# Patient Record
Sex: Female | Born: 1945
Health system: Southern US, Community
[De-identification: ages and names within clinical notes are randomized; demographics above are authoritative.]

## PROBLEM LIST (undated history)

## (undated) DIAGNOSIS — E559 Vitamin D deficiency, unspecified: Secondary | ICD-10-CM

## (undated) DIAGNOSIS — N189 Chronic kidney disease, unspecified: Secondary | ICD-10-CM

## (undated) DIAGNOSIS — Z923 Personal history of irradiation: Secondary | ICD-10-CM

## (undated) DIAGNOSIS — R109 Unspecified abdominal pain: Secondary | ICD-10-CM

## (undated) DIAGNOSIS — N183 Chronic kidney disease, stage 3 unspecified: Secondary | ICD-10-CM

## (undated) DIAGNOSIS — N9489 Other specified conditions associated with female genital organs and menstrual cycle: Secondary | ICD-10-CM

## (undated) DIAGNOSIS — K56609 Unspecified intestinal obstruction, unspecified as to partial versus complete obstruction: Secondary | ICD-10-CM

## (undated) DIAGNOSIS — T7840XA Allergy, unspecified, initial encounter: Secondary | ICD-10-CM

## (undated) DIAGNOSIS — M109 Gout, unspecified: Secondary | ICD-10-CM

## (undated) DIAGNOSIS — C50919 Malignant neoplasm of unspecified site of unspecified female breast: Secondary | ICD-10-CM

## (undated) DIAGNOSIS — N2581 Secondary hyperparathyroidism of renal origin: Secondary | ICD-10-CM

## (undated) DIAGNOSIS — J189 Pneumonia, unspecified organism: Secondary | ICD-10-CM

## (undated) DIAGNOSIS — IMO0002 Reserved for concepts with insufficient information to code with codable children: Secondary | ICD-10-CM

## (undated) DIAGNOSIS — Z972 Presence of dental prosthetic device (complete) (partial): Secondary | ICD-10-CM

## (undated) DIAGNOSIS — D649 Anemia, unspecified: Secondary | ICD-10-CM

## (undated) DIAGNOSIS — R0902 Hypoxemia: Secondary | ICD-10-CM

## (undated) DIAGNOSIS — I823 Embolism and thrombosis of renal vein: Secondary | ICD-10-CM

## (undated) DIAGNOSIS — I4510 Unspecified right bundle-branch block: Secondary | ICD-10-CM

## (undated) DIAGNOSIS — I639 Cerebral infarction, unspecified: Secondary | ICD-10-CM

## (undated) DIAGNOSIS — I890 Lymphedema, not elsewhere classified: Secondary | ICD-10-CM

## (undated) DIAGNOSIS — M329 Systemic lupus erythematosus, unspecified: Secondary | ICD-10-CM

## (undated) DIAGNOSIS — N184 Chronic kidney disease, stage 4 (severe): Secondary | ICD-10-CM

## (undated) DIAGNOSIS — E785 Hyperlipidemia, unspecified: Secondary | ICD-10-CM

## (undated) DIAGNOSIS — I509 Heart failure, unspecified: Secondary | ICD-10-CM

## (undated) DIAGNOSIS — I10 Essential (primary) hypertension: Secondary | ICD-10-CM

## (undated) DIAGNOSIS — I2510 Atherosclerotic heart disease of native coronary artery without angina pectoris: Secondary | ICD-10-CM

## (undated) DIAGNOSIS — G8929 Other chronic pain: Secondary | ICD-10-CM

## (undated) DIAGNOSIS — E538 Deficiency of other specified B group vitamins: Secondary | ICD-10-CM

## (undated) HISTORY — DX: Atherosclerotic heart disease of native coronary artery without angina pectoris: I25.10

## (undated) HISTORY — DX: Chronic kidney disease, unspecified: N18.9

## (undated) HISTORY — DX: Lymphedema, not elsewhere classified: I89.0

## (undated) HISTORY — DX: Personal history of irradiation: Z92.3

## (undated) HISTORY — DX: Unspecified abdominal pain: R10.9

## (undated) HISTORY — DX: Allergy, unspecified, initial encounter: T78.40XA

## (undated) HISTORY — PX: ABDOMINAL HYSTERECTOMY: SHX81

## (undated) HISTORY — PX: BREAST LUMPECTOMY: SHX2

## (undated) HISTORY — PX: CARDIAC CATHETERIZATION: SHX172

## (undated) HISTORY — PX: HEMICOLECTOMY: SHX854

## (undated) HISTORY — PX: HERNIA REPAIR: SHX51

## (undated) HISTORY — DX: Vitamin D deficiency, unspecified: E55.9

## (undated) HISTORY — DX: Deficiency of other specified B group vitamins: E53.8

## (undated) HISTORY — DX: Other specified conditions associated with female genital organs and menstrual cycle: N94.89

## (undated) HISTORY — DX: Hyperlipidemia, unspecified: E78.5

## (undated) HISTORY — DX: Secondary hyperparathyroidism of renal origin: N25.81

## (undated) HISTORY — DX: Heart failure, unspecified: I50.9

## (undated) HISTORY — DX: Anemia, unspecified: D64.9

## (undated) HISTORY — DX: Cerebral infarction, unspecified: I63.9

## (undated) HISTORY — PX: COLONOSCOPY: SHX174

## (undated) HISTORY — PX: CHOLECYSTECTOMY: SHX55

## (undated) HISTORY — DX: Gout, unspecified: M10.9

## (undated) HISTORY — DX: Embolism and thrombosis of renal vein: I82.3

## (undated) HISTORY — DX: Other chronic pain: G89.29

## (undated) HISTORY — PX: BREAST BIOPSY: SHX20

## (undated) HISTORY — DX: Essential (primary) hypertension: I10

---

## 1994-03-19 DIAGNOSIS — I2510 Atherosclerotic heart disease of native coronary artery without angina pectoris: Secondary | ICD-10-CM

## 1994-03-19 HISTORY — DX: Atherosclerotic heart disease of native coronary artery without angina pectoris: I25.10

## 1998-05-03 ENCOUNTER — Ambulatory Visit (HOSPITAL_COMMUNITY): Admission: RE | Admit: 1998-05-03 | Discharge: 1998-05-03 | Payer: Self-pay | Admitting: General Surgery

## 1998-07-01 ENCOUNTER — Ambulatory Visit (HOSPITAL_COMMUNITY): Admission: RE | Admit: 1998-07-01 | Discharge: 1998-07-01 | Payer: Self-pay | Admitting: Neurosurgery

## 1998-07-01 ENCOUNTER — Encounter: Payer: Self-pay | Admitting: Neurosurgery

## 1998-07-11 ENCOUNTER — Ambulatory Visit (HOSPITAL_BASED_OUTPATIENT_CLINIC_OR_DEPARTMENT_OTHER): Admission: RE | Admit: 1998-07-11 | Discharge: 1998-07-11 | Payer: Self-pay

## 1998-07-21 ENCOUNTER — Encounter: Payer: Self-pay | Admitting: Neurosurgery

## 1998-07-25 ENCOUNTER — Encounter: Payer: Self-pay | Admitting: Neurosurgery

## 1998-07-25 ENCOUNTER — Inpatient Hospital Stay (HOSPITAL_COMMUNITY): Admission: RE | Admit: 1998-07-25 | Discharge: 1998-07-27 | Payer: Self-pay | Admitting: Neurosurgery

## 1998-09-04 ENCOUNTER — Ambulatory Visit (HOSPITAL_COMMUNITY): Admission: RE | Admit: 1998-09-04 | Discharge: 1998-09-04 | Payer: Self-pay | Admitting: Neurosurgery

## 1998-09-04 ENCOUNTER — Encounter: Payer: Self-pay | Admitting: Neurosurgery

## 1998-11-08 ENCOUNTER — Observation Stay (HOSPITAL_COMMUNITY): Admission: AD | Admit: 1998-11-08 | Discharge: 1998-11-09 | Payer: Self-pay | Admitting: *Deleted

## 1998-11-30 ENCOUNTER — Encounter: Admission: RE | Admit: 1998-11-30 | Discharge: 1999-02-28 | Payer: Self-pay | Admitting: Internal Medicine

## 1999-03-16 ENCOUNTER — Ambulatory Visit (HOSPITAL_COMMUNITY): Admission: RE | Admit: 1999-03-16 | Discharge: 1999-03-16 | Payer: Self-pay | Admitting: Internal Medicine

## 1999-05-11 ENCOUNTER — Encounter: Admission: RE | Admit: 1999-05-11 | Discharge: 1999-05-11 | Payer: Self-pay | Admitting: Obstetrics and Gynecology

## 1999-05-11 ENCOUNTER — Encounter: Payer: Self-pay | Admitting: Obstetrics and Gynecology

## 1999-08-11 ENCOUNTER — Encounter: Admission: RE | Admit: 1999-08-11 | Discharge: 1999-11-09 | Payer: Self-pay | Admitting: *Deleted

## 2000-05-13 ENCOUNTER — Encounter: Admission: RE | Admit: 2000-05-13 | Discharge: 2000-08-11 | Payer: Self-pay | Admitting: Internal Medicine

## 2000-05-16 ENCOUNTER — Encounter: Payer: Self-pay | Admitting: Obstetrics and Gynecology

## 2000-05-16 ENCOUNTER — Encounter: Admission: RE | Admit: 2000-05-16 | Discharge: 2000-05-16 | Payer: Self-pay | Admitting: Obstetrics and Gynecology

## 2000-09-11 ENCOUNTER — Encounter: Admission: RE | Admit: 2000-09-11 | Discharge: 2000-12-10 | Payer: Self-pay | Admitting: Internal Medicine

## 2001-01-13 ENCOUNTER — Encounter: Payer: Self-pay | Admitting: Gastroenterology

## 2001-01-14 ENCOUNTER — Encounter (INDEPENDENT_AMBULATORY_CARE_PROVIDER_SITE_OTHER): Payer: Self-pay | Admitting: Specialist

## 2001-01-14 ENCOUNTER — Other Ambulatory Visit: Admission: RE | Admit: 2001-01-14 | Discharge: 2001-01-14 | Payer: Self-pay | Admitting: Gastroenterology

## 2001-02-11 ENCOUNTER — Encounter: Admission: RE | Admit: 2001-02-11 | Discharge: 2001-05-12 | Payer: Self-pay | Admitting: Internal Medicine

## 2001-02-24 ENCOUNTER — Ambulatory Visit (HOSPITAL_COMMUNITY): Admission: RE | Admit: 2001-02-24 | Discharge: 2001-02-24 | Payer: Self-pay | Admitting: Gastroenterology

## 2001-02-24 ENCOUNTER — Encounter: Payer: Self-pay | Admitting: Gastroenterology

## 2001-03-19 HISTORY — PX: BOWEL RESECTION: SHX1257

## 2001-04-16 ENCOUNTER — Encounter: Payer: Self-pay | Admitting: Gastroenterology

## 2001-04-16 ENCOUNTER — Ambulatory Visit (HOSPITAL_COMMUNITY): Admission: RE | Admit: 2001-04-16 | Discharge: 2001-04-16 | Payer: Self-pay | Admitting: Gastroenterology

## 2001-04-25 ENCOUNTER — Other Ambulatory Visit: Admission: RE | Admit: 2001-04-25 | Discharge: 2001-04-25 | Payer: Self-pay | Admitting: *Deleted

## 2001-05-08 ENCOUNTER — Ambulatory Visit (HOSPITAL_COMMUNITY): Admission: RE | Admit: 2001-05-08 | Discharge: 2001-05-08 | Payer: Self-pay | Admitting: *Deleted

## 2001-05-08 ENCOUNTER — Encounter: Payer: Self-pay | Admitting: *Deleted

## 2001-05-13 ENCOUNTER — Ambulatory Visit: Admission: RE | Admit: 2001-05-13 | Discharge: 2001-05-13 | Payer: Self-pay | Admitting: Gynecology

## 2001-05-28 ENCOUNTER — Ambulatory Visit (HOSPITAL_COMMUNITY): Admission: RE | Admit: 2001-05-28 | Discharge: 2001-05-28 | Payer: Self-pay | Admitting: *Deleted

## 2001-05-28 ENCOUNTER — Encounter: Payer: Self-pay | Admitting: Gynecology

## 2001-06-04 ENCOUNTER — Ambulatory Visit (HOSPITAL_COMMUNITY): Admission: RE | Admit: 2001-06-04 | Discharge: 2001-06-04 | Payer: Self-pay | Admitting: *Deleted

## 2001-06-27 ENCOUNTER — Ambulatory Visit (HOSPITAL_COMMUNITY): Admission: RE | Admit: 2001-06-27 | Discharge: 2001-06-27 | Payer: Self-pay | Admitting: Cardiovascular Disease

## 2001-07-08 ENCOUNTER — Encounter: Payer: Self-pay | Admitting: Cardiology

## 2001-07-08 ENCOUNTER — Ambulatory Visit (HOSPITAL_COMMUNITY): Admission: RE | Admit: 2001-07-08 | Discharge: 2001-07-08 | Payer: Self-pay | Admitting: Cardiology

## 2001-10-07 ENCOUNTER — Ambulatory Visit: Admission: RE | Admit: 2001-10-07 | Discharge: 2001-10-07 | Payer: Self-pay | Admitting: Gynecologic Oncology

## 2001-10-17 DIAGNOSIS — N9489 Other specified conditions associated with female genital organs and menstrual cycle: Secondary | ICD-10-CM

## 2001-10-17 HISTORY — DX: Other specified conditions associated with female genital organs and menstrual cycle: N94.89

## 2001-10-17 HISTORY — PX: BILATERAL SALPINGOOPHORECTOMY: SHX1223

## 2001-11-12 ENCOUNTER — Encounter (INDEPENDENT_AMBULATORY_CARE_PROVIDER_SITE_OTHER): Payer: Self-pay | Admitting: Specialist

## 2001-11-12 ENCOUNTER — Ambulatory Visit (HOSPITAL_COMMUNITY): Admission: RE | Admit: 2001-11-12 | Discharge: 2001-11-12 | Payer: Self-pay | Admitting: *Deleted

## 2001-12-06 ENCOUNTER — Encounter: Payer: Self-pay | Admitting: Emergency Medicine

## 2001-12-06 ENCOUNTER — Emergency Department (HOSPITAL_COMMUNITY): Admission: EM | Admit: 2001-12-06 | Discharge: 2001-12-06 | Payer: Self-pay | Admitting: Unknown Physician Specialty

## 2001-12-08 ENCOUNTER — Inpatient Hospital Stay (HOSPITAL_COMMUNITY): Admission: EM | Admit: 2001-12-08 | Discharge: 2001-12-20 | Payer: Self-pay | Admitting: Emergency Medicine

## 2001-12-08 ENCOUNTER — Encounter: Payer: Self-pay | Admitting: Internal Medicine

## 2001-12-09 ENCOUNTER — Encounter: Payer: Self-pay | Admitting: Internal Medicine

## 2001-12-09 ENCOUNTER — Encounter (INDEPENDENT_AMBULATORY_CARE_PROVIDER_SITE_OTHER): Payer: Self-pay | Admitting: Specialist

## 2001-12-16 ENCOUNTER — Encounter: Payer: Self-pay | Admitting: Urology

## 2002-01-04 ENCOUNTER — Encounter: Payer: Self-pay | Admitting: Emergency Medicine

## 2002-01-04 ENCOUNTER — Inpatient Hospital Stay (HOSPITAL_COMMUNITY): Admission: EM | Admit: 2002-01-04 | Discharge: 2002-01-10 | Payer: Self-pay | Admitting: Emergency Medicine

## 2002-01-05 ENCOUNTER — Encounter: Payer: Self-pay | Admitting: Nephrology

## 2002-01-06 ENCOUNTER — Encounter (INDEPENDENT_AMBULATORY_CARE_PROVIDER_SITE_OTHER): Payer: Self-pay | Admitting: *Deleted

## 2002-01-06 ENCOUNTER — Encounter: Payer: Self-pay | Admitting: Gastroenterology

## 2002-01-07 ENCOUNTER — Encounter: Payer: Self-pay | Admitting: Gastroenterology

## 2002-01-18 ENCOUNTER — Inpatient Hospital Stay (HOSPITAL_COMMUNITY): Admission: EM | Admit: 2002-01-18 | Discharge: 2002-01-29 | Payer: Self-pay | Admitting: Emergency Medicine

## 2002-01-18 ENCOUNTER — Encounter: Payer: Self-pay | Admitting: Family Medicine

## 2002-01-18 ENCOUNTER — Encounter: Payer: Self-pay | Admitting: Gastroenterology

## 2002-01-21 ENCOUNTER — Encounter: Payer: Self-pay | Admitting: Gastroenterology

## 2002-01-21 ENCOUNTER — Encounter: Payer: Self-pay | Admitting: Orthopaedic Surgery

## 2002-01-23 ENCOUNTER — Encounter: Payer: Self-pay | Admitting: Gastroenterology

## 2002-01-24 ENCOUNTER — Encounter: Payer: Self-pay | Admitting: Gastroenterology

## 2002-01-29 ENCOUNTER — Encounter: Payer: Self-pay | Admitting: Internal Medicine

## 2002-03-05 ENCOUNTER — Inpatient Hospital Stay (HOSPITAL_COMMUNITY): Admission: EM | Admit: 2002-03-05 | Discharge: 2002-03-10 | Payer: Self-pay | Admitting: Emergency Medicine

## 2002-03-06 ENCOUNTER — Encounter: Payer: Self-pay | Admitting: Pulmonary Disease

## 2003-05-25 ENCOUNTER — Emergency Department (HOSPITAL_COMMUNITY): Admission: EM | Admit: 2003-05-25 | Discharge: 2003-05-25 | Payer: Self-pay | Admitting: Emergency Medicine

## 2004-02-14 ENCOUNTER — Ambulatory Visit: Payer: Self-pay | Admitting: Hematology & Oncology

## 2004-03-01 ENCOUNTER — Ambulatory Visit: Payer: Self-pay | Admitting: Internal Medicine

## 2004-03-08 ENCOUNTER — Ambulatory Visit: Payer: Self-pay | Admitting: Internal Medicine

## 2004-04-04 ENCOUNTER — Ambulatory Visit: Payer: Self-pay | Admitting: Internal Medicine

## 2004-04-11 ENCOUNTER — Ambulatory Visit: Payer: Self-pay | Admitting: Hematology & Oncology

## 2004-04-19 ENCOUNTER — Ambulatory Visit: Payer: Self-pay | Admitting: Internal Medicine

## 2004-05-16 ENCOUNTER — Ambulatory Visit: Payer: Self-pay | Admitting: Internal Medicine

## 2004-06-09 ENCOUNTER — Ambulatory Visit: Payer: Self-pay | Admitting: Hematology & Oncology

## 2004-06-09 ENCOUNTER — Ambulatory Visit: Payer: Self-pay | Admitting: Internal Medicine

## 2004-06-17 HISTORY — PX: PTCA: SHX146

## 2004-06-19 ENCOUNTER — Ambulatory Visit: Payer: Self-pay | Admitting: Internal Medicine

## 2004-06-29 ENCOUNTER — Encounter: Payer: Self-pay | Admitting: Cardiology

## 2004-06-29 ENCOUNTER — Ambulatory Visit: Payer: Self-pay | Admitting: Cardiovascular Disease

## 2004-06-29 ENCOUNTER — Inpatient Hospital Stay (HOSPITAL_COMMUNITY): Admission: EM | Admit: 2004-06-29 | Discharge: 2004-07-01 | Payer: Self-pay | Admitting: Emergency Medicine

## 2004-07-06 ENCOUNTER — Ambulatory Visit: Payer: Self-pay | Admitting: Cardiovascular Disease

## 2004-07-10 ENCOUNTER — Encounter: Admission: RE | Admit: 2004-07-10 | Discharge: 2004-10-08 | Payer: Self-pay | Admitting: Internal Medicine

## 2004-07-18 ENCOUNTER — Ambulatory Visit: Payer: Self-pay | Admitting: Cardiovascular Disease

## 2004-07-20 ENCOUNTER — Ambulatory Visit: Payer: Self-pay | Admitting: Internal Medicine

## 2004-07-28 ENCOUNTER — Ambulatory Visit: Payer: Self-pay

## 2004-07-31 ENCOUNTER — Ambulatory Visit: Payer: Self-pay | Admitting: Cardiovascular Disease

## 2004-07-31 ENCOUNTER — Ambulatory Visit: Payer: Self-pay | Admitting: Hematology & Oncology

## 2004-09-29 ENCOUNTER — Ambulatory Visit: Payer: Self-pay | Admitting: Cardiovascular Disease

## 2004-10-03 ENCOUNTER — Ambulatory Visit: Payer: Self-pay | Admitting: Hematology & Oncology

## 2004-10-27 ENCOUNTER — Ambulatory Visit: Payer: Self-pay | Admitting: Internal Medicine

## 2004-11-14 ENCOUNTER — Ambulatory Visit: Payer: Self-pay | Admitting: Internal Medicine

## 2004-11-22 ENCOUNTER — Encounter: Admission: RE | Admit: 2004-11-22 | Discharge: 2005-02-20 | Payer: Self-pay | Admitting: Internal Medicine

## 2004-11-28 ENCOUNTER — Ambulatory Visit: Payer: Self-pay | Admitting: Internal Medicine

## 2004-11-29 ENCOUNTER — Ambulatory Visit: Payer: Self-pay | Admitting: Hematology & Oncology

## 2005-01-09 ENCOUNTER — Ambulatory Visit: Payer: Self-pay | Admitting: Internal Medicine

## 2005-01-23 ENCOUNTER — Ambulatory Visit: Payer: Self-pay | Admitting: Internal Medicine

## 2005-01-24 ENCOUNTER — Ambulatory Visit: Payer: Self-pay | Admitting: Hematology & Oncology

## 2005-02-27 ENCOUNTER — Ambulatory Visit: Payer: Self-pay | Admitting: Internal Medicine

## 2005-03-13 ENCOUNTER — Ambulatory Visit: Payer: Self-pay | Admitting: Internal Medicine

## 2005-03-21 ENCOUNTER — Ambulatory Visit: Payer: Self-pay | Admitting: Hematology & Oncology

## 2005-04-03 ENCOUNTER — Ambulatory Visit: Payer: Self-pay | Admitting: Internal Medicine

## 2005-04-17 ENCOUNTER — Ambulatory Visit: Payer: Self-pay | Admitting: Cardiovascular Disease

## 2005-05-08 ENCOUNTER — Ambulatory Visit: Payer: Self-pay

## 2005-05-16 ENCOUNTER — Ambulatory Visit: Payer: Self-pay | Admitting: Hematology & Oncology

## 2005-07-02 ENCOUNTER — Ambulatory Visit: Payer: Self-pay | Admitting: Internal Medicine

## 2005-07-10 ENCOUNTER — Ambulatory Visit: Payer: Self-pay | Admitting: Hematology & Oncology

## 2005-07-11 LAB — CBC WITH DIFFERENTIAL/PLATELET
Basophils Absolute: 0 10*3/uL (ref 0.0–0.1)
Eosinophils Absolute: 0.1 10*3/uL (ref 0.0–0.5)
HGB: 11.2 g/dL — ABNORMAL LOW (ref 11.6–15.9)
MONO#: 0.3 10*3/uL (ref 0.1–0.9)
NEUT#: 6.9 10*3/uL — ABNORMAL HIGH (ref 1.5–6.5)
RBC: 4.08 10*6/uL (ref 3.70–5.32)
RDW: 17.8 % — ABNORMAL HIGH (ref 11.3–14.5)
WBC: 9.5 10*3/uL (ref 3.9–10.0)

## 2005-08-08 LAB — CBC & DIFF AND RETIC
Basophils Absolute: 0.1 10*3/uL (ref 0.0–0.1)
Eosinophils Absolute: 0.1 10*3/uL (ref 0.0–0.5)
HCT: 34.8 % (ref 34.8–46.6)
HGB: 11.6 g/dL (ref 11.6–15.9)
IRF: 0.26 (ref 0.130–0.330)
MCH: 27.7 pg (ref 26.0–34.0)
MCV: 83.4 fL (ref 81.0–101.0)
MONO%: 4.3 % (ref 0.0–13.0)
NEUT#: 6.4 10*3/uL (ref 1.5–6.5)
NEUT%: 70.3 % (ref 39.6–76.8)
RDW: 17.8 % — ABNORMAL HIGH (ref 11.3–14.5)
RETIC #: 29.6 10*3/uL (ref 19.7–115.1)
lymph#: 2.2 10*3/uL (ref 0.9–3.3)

## 2005-09-03 ENCOUNTER — Ambulatory Visit: Payer: Self-pay | Admitting: Hematology & Oncology

## 2005-09-05 LAB — CBC WITH DIFFERENTIAL/PLATELET
Basophils Absolute: 0.1 10*3/uL (ref 0.0–0.1)
EOS%: 1.1 % (ref 0.0–7.0)
HCT: 33.9 % — ABNORMAL LOW (ref 34.8–46.6)
HGB: 11.4 g/dL — ABNORMAL LOW (ref 11.6–15.9)
MCH: 27.9 pg (ref 26.0–34.0)
MONO#: 0.4 10*3/uL (ref 0.1–0.9)
NEUT#: 7 10*3/uL — ABNORMAL HIGH (ref 1.5–6.5)
NEUT%: 72.8 % (ref 39.6–76.8)
RDW: 17.4 % — ABNORMAL HIGH (ref 11.3–14.5)
WBC: 9.6 10*3/uL (ref 3.9–10.0)
lymph#: 2.1 10*3/uL (ref 0.9–3.3)

## 2005-10-01 ENCOUNTER — Ambulatory Visit: Payer: Self-pay | Admitting: Internal Medicine

## 2005-10-03 LAB — CBC WITH DIFFERENTIAL/PLATELET
BASO%: 0.4 % (ref 0.0–2.0)
EOS%: 0.9 % (ref 0.0–7.0)
HCT: 33.5 % — ABNORMAL LOW (ref 34.8–46.6)
MCH: 28.1 pg (ref 26.0–34.0)
MCHC: 33.6 g/dL (ref 32.0–36.0)
MONO#: 0.4 10*3/uL (ref 0.1–0.9)
NEUT%: 73.9 % (ref 39.6–76.8)
RBC: 4.01 10*6/uL (ref 3.70–5.32)
RDW: 17.2 % — ABNORMAL HIGH (ref 11.3–14.5)
WBC: 8.6 10*3/uL (ref 3.9–10.0)
lymph#: 1.7 10*3/uL (ref 0.9–3.3)

## 2005-10-11 ENCOUNTER — Other Ambulatory Visit: Admission: RE | Admit: 2005-10-11 | Discharge: 2005-10-11 | Payer: Self-pay | Admitting: Gynecology

## 2005-10-19 ENCOUNTER — Ambulatory Visit (HOSPITAL_COMMUNITY): Admission: RE | Admit: 2005-10-19 | Discharge: 2005-10-19 | Payer: Self-pay | Admitting: Gynecology

## 2005-10-25 ENCOUNTER — Encounter: Admission: RE | Admit: 2005-10-25 | Discharge: 2006-01-23 | Payer: Self-pay | Admitting: Internal Medicine

## 2005-10-30 ENCOUNTER — Ambulatory Visit: Payer: Self-pay | Admitting: Hematology & Oncology

## 2005-10-31 LAB — CBC WITH DIFFERENTIAL/PLATELET
Basophils Absolute: 0 10*3/uL (ref 0.0–0.1)
Eosinophils Absolute: 0.1 10*3/uL (ref 0.0–0.5)
HCT: 33.4 % — ABNORMAL LOW (ref 34.8–46.6)
HGB: 11.2 g/dL — ABNORMAL LOW (ref 11.6–15.9)
LYMPH%: 22.5 % (ref 14.0–48.0)
MCHC: 33.4 g/dL (ref 32.0–36.0)
MONO#: 0.5 10*3/uL (ref 0.1–0.9)
NEUT#: 7.2 10*3/uL — ABNORMAL HIGH (ref 1.5–6.5)
NEUT%: 71.6 % (ref 39.6–76.8)
Platelets: 297 10*3/uL (ref 145–400)
WBC: 10.1 10*3/uL — ABNORMAL HIGH (ref 3.9–10.0)
lymph#: 2.3 10*3/uL (ref 0.9–3.3)

## 2005-11-12 ENCOUNTER — Ambulatory Visit: Payer: Self-pay | Admitting: Internal Medicine

## 2005-11-26 ENCOUNTER — Ambulatory Visit: Payer: Self-pay | Admitting: Cardiovascular Disease

## 2005-11-28 LAB — CBC WITH DIFFERENTIAL/PLATELET
BASO%: 0.3 % (ref 0.0–2.0)
Basophils Absolute: 0 10*3/uL (ref 0.0–0.1)
EOS%: 0.6 % (ref 0.0–7.0)
HCT: 31.2 % — ABNORMAL LOW (ref 34.8–46.6)
HGB: 10.6 g/dL — ABNORMAL LOW (ref 11.6–15.9)
LYMPH%: 20.3 % (ref 14.0–48.0)
MCH: 28.7 pg (ref 26.0–34.0)
MCHC: 34 g/dL (ref 32.0–36.0)
MCV: 84.3 fL (ref 81.0–101.0)
NEUT%: 75.6 % (ref 39.6–76.8)
Platelets: 272 10*3/uL (ref 145–400)

## 2005-12-24 ENCOUNTER — Ambulatory Visit: Payer: Self-pay | Admitting: Hematology & Oncology

## 2006-01-03 LAB — CBC WITH DIFFERENTIAL/PLATELET
Eosinophils Absolute: 0.1 10*3/uL (ref 0.0–0.5)
LYMPH%: 20.3 % (ref 14.0–48.0)
MONO#: 0.3 10*3/uL (ref 0.1–0.9)
NEUT#: 7.9 10*3/uL — ABNORMAL HIGH (ref 1.5–6.5)
Platelets: 338 10*3/uL (ref 145–400)
RBC: 3.83 10*6/uL (ref 3.70–5.32)
WBC: 10.4 10*3/uL — ABNORMAL HIGH (ref 3.9–10.0)
lymph#: 2.1 10*3/uL (ref 0.9–3.3)

## 2006-02-18 ENCOUNTER — Ambulatory Visit: Payer: Self-pay | Admitting: Hematology & Oncology

## 2006-02-27 ENCOUNTER — Encounter (INDEPENDENT_AMBULATORY_CARE_PROVIDER_SITE_OTHER): Payer: Self-pay | Admitting: Infectious Diseases

## 2006-02-27 ENCOUNTER — Ambulatory Visit: Payer: Self-pay | Admitting: Internal Medicine

## 2006-02-27 LAB — CONVERTED CEMR LAB
AST: 30 units/L (ref 0–37)
Albumin: 4.3 g/dL (ref 3.5–5.2)
Alkaline Phosphatase: 117 units/L (ref 39–117)
BUN: 21 mg/dL (ref 6–23)
CO2: 27 meq/L (ref 19–32)
Calcium: 9.8 mg/dL (ref 8.4–10.5)
Chloride: 104 meq/L (ref 96–112)
Creatinine, Ser: 1 mg/dL (ref 0.40–1.20)
Creatinine, Urine: 249.9 mg/dL
Hemoglobin: 11.3 g/dL — ABNORMAL LOW (ref 11.7–14.8)
MCHC: 33 g/dL — ABNORMAL LOW (ref 33.1–35.4)
Microalb Creat Ratio: 92.8 mg/g — ABNORMAL HIGH (ref 0.0–30.0)
Microalb, Ur: 23.2 mg/dL — ABNORMAL HIGH (ref 0.00–1.89)
Platelets: 290 10*3/uL (ref 152–374)
Sodium: 141 meq/L (ref 135–145)
WBC: 9.4 10*3/uL (ref 3.7–10.0)

## 2006-02-28 ENCOUNTER — Encounter (INDEPENDENT_AMBULATORY_CARE_PROVIDER_SITE_OTHER): Payer: Self-pay | Admitting: Infectious Diseases

## 2006-02-28 ENCOUNTER — Ambulatory Visit: Payer: Self-pay | Admitting: *Deleted

## 2006-02-28 LAB — CONVERTED CEMR LAB
Cholesterol: 197 mg/dL (ref 0–200)
HDL: 38 mg/dL — ABNORMAL LOW (ref >39)
Total CHOL/HDL Ratio: 5.2
VLDL: 51 mg/dL — ABNORMAL HIGH (ref 0–40)

## 2006-04-17 ENCOUNTER — Ambulatory Visit: Payer: Self-pay | Admitting: Hospitalist

## 2006-04-17 ENCOUNTER — Encounter (INDEPENDENT_AMBULATORY_CARE_PROVIDER_SITE_OTHER): Payer: Self-pay | Admitting: Infectious Diseases

## 2006-04-17 DIAGNOSIS — K449 Diaphragmatic hernia without obstruction or gangrene: Secondary | ICD-10-CM | POA: Insufficient documentation

## 2006-04-17 LAB — CONVERTED CEMR LAB
Blood Glucose, Fingerstick: 160
Calcium: 8.8 mg/dL (ref 8.4–10.5)
Chloride: 103 meq/L (ref 96–112)
Hemoglobin: 11.1 g/dL — ABNORMAL LOW (ref 12.0–15.0)
Platelets: 333 10*3/uL (ref 150–400)
Potassium: 3.6 meq/L (ref 3.5–5.3)
RDW: 14.1 % — ABNORMAL HIGH (ref 11.5–14.0)
Sodium: 138 meq/L (ref 135–145)
WBC: 11.8 10*3/uL — ABNORMAL HIGH (ref 4.0–10.5)

## 2006-07-16 ENCOUNTER — Telehealth: Payer: Self-pay | Admitting: *Deleted

## 2006-07-25 ENCOUNTER — Telehealth: Payer: Self-pay | Admitting: *Deleted

## 2006-08-14 ENCOUNTER — Telehealth: Payer: Self-pay | Admitting: *Deleted

## 2006-09-10 ENCOUNTER — Telehealth: Payer: Self-pay | Admitting: *Deleted

## 2006-09-19 ENCOUNTER — Ambulatory Visit: Payer: Self-pay | Admitting: Internal Medicine

## 2006-09-19 LAB — CONVERTED CEMR LAB
Blood Glucose, Fingerstick: 128
Hgb A1c MFr Bld: 6.5 %

## 2006-09-20 ENCOUNTER — Encounter (INDEPENDENT_AMBULATORY_CARE_PROVIDER_SITE_OTHER): Payer: Self-pay | Admitting: Internal Medicine

## 2006-09-25 LAB — CONVERTED CEMR LAB
BUN: 23 mg/dL (ref 6–23)
Basophils Relative: 0 % (ref 0–1)
CO2: 20 meq/L (ref 19–32)
Calcium: 9.4 mg/dL (ref 8.4–10.5)
Eosinophils Absolute: 0.1 10*3/uL (ref 0.0–0.7)
Eosinophils Relative: 1 % (ref 0–5)
HCT: 31.2 % — ABNORMAL LOW (ref 36.0–46.0)
Lymphocytes Relative: 24 % (ref 12–46)
Lymphs Abs: 2.5 10*3/uL (ref 0.7–3.3)
MCV: 86.2 fL (ref 78.0–100.0)
Monocytes Absolute: 0.4 10*3/uL (ref 0.2–0.7)
Monocytes Relative: 4 % (ref 3–11)
Neutrophils Relative %: 71 % (ref 43–77)
Platelets: 306 10*3/uL (ref 150–400)
Sodium: 138 meq/L (ref 135–145)
WBC: 10.4 10*3/uL (ref 4.0–10.5)

## 2006-11-21 ENCOUNTER — Telehealth: Payer: Self-pay | Admitting: *Deleted

## 2006-11-28 ENCOUNTER — Ambulatory Visit: Payer: Self-pay | Admitting: Cardiovascular Disease

## 2006-12-10 ENCOUNTER — Ambulatory Visit: Payer: Self-pay

## 2006-12-18 ENCOUNTER — Telehealth (INDEPENDENT_AMBULATORY_CARE_PROVIDER_SITE_OTHER): Payer: Self-pay | Admitting: Infectious Diseases

## 2006-12-27 ENCOUNTER — Ambulatory Visit: Payer: Self-pay | Admitting: Infectious Diseases

## 2006-12-27 ENCOUNTER — Encounter (INDEPENDENT_AMBULATORY_CARE_PROVIDER_SITE_OTHER): Payer: Self-pay | Admitting: Infectious Diseases

## 2006-12-30 ENCOUNTER — Telehealth (INDEPENDENT_AMBULATORY_CARE_PROVIDER_SITE_OTHER): Payer: Self-pay | Admitting: Infectious Diseases

## 2006-12-31 LAB — CONVERTED CEMR LAB
CO2: 23 meq/L (ref 19–32)
Chloride: 103 meq/L (ref 96–112)
Ferritin: 46 ng/mL (ref 10–291)
Hemoglobin: 9.7 g/dL — ABNORMAL LOW (ref 12.0–15.0)
MCHC: 31.8 g/dL (ref 30.0–36.0)
Platelets: 273 10*3/uL (ref 150–400)
RBC: 3.58 M/uL — ABNORMAL LOW (ref 3.87–5.11)
RDW: 14.7 % — ABNORMAL HIGH (ref 11.5–14.0)
Saturation Ratios: 14 % — ABNORMAL LOW (ref 20–55)
TIBC: 383 ug/dL (ref 250–470)
Total Protein: 8.5 g/dL — ABNORMAL HIGH (ref 6.0–8.3)
UIBC: 330 ug/dL

## 2007-01-06 ENCOUNTER — Ambulatory Visit (HOSPITAL_COMMUNITY): Admission: RE | Admit: 2007-01-06 | Discharge: 2007-01-06 | Payer: Self-pay | Admitting: Infectious Diseases

## 2007-01-06 ENCOUNTER — Telehealth: Payer: Self-pay | Admitting: *Deleted

## 2007-01-07 ENCOUNTER — Telehealth: Payer: Self-pay | Admitting: *Deleted

## 2007-01-09 ENCOUNTER — Telehealth: Payer: Self-pay | Admitting: *Deleted

## 2007-02-19 ENCOUNTER — Ambulatory Visit: Payer: Self-pay | Admitting: Internal Medicine

## 2007-02-24 ENCOUNTER — Telehealth (INDEPENDENT_AMBULATORY_CARE_PROVIDER_SITE_OTHER): Payer: Self-pay | Admitting: Infectious Diseases

## 2007-02-25 ENCOUNTER — Telehealth (INDEPENDENT_AMBULATORY_CARE_PROVIDER_SITE_OTHER): Payer: Self-pay | Admitting: Infectious Diseases

## 2007-04-15 ENCOUNTER — Telehealth (INDEPENDENT_AMBULATORY_CARE_PROVIDER_SITE_OTHER): Payer: Self-pay | Admitting: Infectious Diseases

## 2007-04-23 ENCOUNTER — Encounter (INDEPENDENT_AMBULATORY_CARE_PROVIDER_SITE_OTHER): Payer: Self-pay | Admitting: Infectious Diseases

## 2007-04-23 ENCOUNTER — Ambulatory Visit: Payer: Self-pay | Admitting: Internal Medicine

## 2007-04-25 LAB — CONVERTED CEMR LAB
Calcium: 9.1 mg/dL (ref 8.4–10.5)
Chloride: 109 meq/L (ref 96–112)
Glucose, Bld: 134 mg/dL — ABNORMAL HIGH (ref 70–99)
Hemoglobin: 10.4 g/dL — ABNORMAL LOW (ref 12.0–15.0)
MCV: 82.2 fL (ref 78.0–100.0)
WBC: 8.4 10*3/uL (ref 4.0–10.5)

## 2007-04-29 ENCOUNTER — Telehealth (INDEPENDENT_AMBULATORY_CARE_PROVIDER_SITE_OTHER): Payer: Self-pay | Admitting: Infectious Diseases

## 2007-04-30 ENCOUNTER — Encounter (INDEPENDENT_AMBULATORY_CARE_PROVIDER_SITE_OTHER): Payer: Self-pay | Admitting: Infectious Diseases

## 2007-04-30 ENCOUNTER — Ambulatory Visit: Payer: Self-pay | Admitting: Internal Medicine

## 2007-05-05 ENCOUNTER — Telehealth (INDEPENDENT_AMBULATORY_CARE_PROVIDER_SITE_OTHER): Payer: Self-pay | Admitting: Infectious Diseases

## 2007-05-05 ENCOUNTER — Telehealth: Payer: Self-pay | Admitting: *Deleted

## 2007-05-14 LAB — CONVERTED CEMR LAB
Total CHOL/HDL Ratio: 5
Triglycerides: 426 mg/dL — ABNORMAL HIGH (ref <150)

## 2007-05-15 ENCOUNTER — Telehealth (INDEPENDENT_AMBULATORY_CARE_PROVIDER_SITE_OTHER): Payer: Self-pay | Admitting: Infectious Diseases

## 2007-05-16 ENCOUNTER — Telehealth (INDEPENDENT_AMBULATORY_CARE_PROVIDER_SITE_OTHER): Payer: Self-pay | Admitting: Infectious Diseases

## 2007-05-21 ENCOUNTER — Telehealth: Payer: Self-pay | Admitting: *Deleted

## 2007-05-22 ENCOUNTER — Telehealth (INDEPENDENT_AMBULATORY_CARE_PROVIDER_SITE_OTHER): Payer: Self-pay | Admitting: Infectious Diseases

## 2007-05-28 ENCOUNTER — Telehealth (INDEPENDENT_AMBULATORY_CARE_PROVIDER_SITE_OTHER): Payer: Self-pay | Admitting: Infectious Diseases

## 2007-05-28 ENCOUNTER — Telehealth (INDEPENDENT_AMBULATORY_CARE_PROVIDER_SITE_OTHER): Payer: Self-pay | Admitting: *Deleted

## 2007-07-04 ENCOUNTER — Telehealth (INDEPENDENT_AMBULATORY_CARE_PROVIDER_SITE_OTHER): Payer: Self-pay | Admitting: Infectious Diseases

## 2007-07-09 ENCOUNTER — Telehealth (INDEPENDENT_AMBULATORY_CARE_PROVIDER_SITE_OTHER): Payer: Self-pay | Admitting: Infectious Diseases

## 2007-07-22 ENCOUNTER — Telehealth (INDEPENDENT_AMBULATORY_CARE_PROVIDER_SITE_OTHER): Payer: Self-pay | Admitting: Infectious Diseases

## 2007-08-05 ENCOUNTER — Encounter (INDEPENDENT_AMBULATORY_CARE_PROVIDER_SITE_OTHER): Payer: Self-pay | Admitting: Infectious Diseases

## 2007-08-05 LAB — CONVERTED CEMR LAB
Amphetamine Screen, Ur: NEGATIVE
Benzodiazepines.: NEGATIVE
Cocaine Metabolites: NEGATIVE
Creatinine,U: 105.8 mg/dL
Methadone: NEGATIVE
Phencyclidine (PCP): NEGATIVE

## 2007-08-08 ENCOUNTER — Encounter (INDEPENDENT_AMBULATORY_CARE_PROVIDER_SITE_OTHER): Payer: Self-pay | Admitting: Infectious Diseases

## 2007-08-26 ENCOUNTER — Telehealth (INDEPENDENT_AMBULATORY_CARE_PROVIDER_SITE_OTHER): Payer: Self-pay | Admitting: Infectious Diseases

## 2007-09-29 ENCOUNTER — Telehealth: Payer: Self-pay | Admitting: Internal Medicine

## 2007-10-31 ENCOUNTER — Telehealth: Payer: Self-pay | Admitting: *Deleted

## 2007-11-03 ENCOUNTER — Telehealth: Payer: Self-pay | Admitting: *Deleted

## 2007-11-05 ENCOUNTER — Ambulatory Visit: Payer: Self-pay | Admitting: Internal Medicine

## 2007-11-05 ENCOUNTER — Encounter: Payer: Self-pay | Admitting: Internal Medicine

## 2007-11-05 ENCOUNTER — Ambulatory Visit: Payer: Self-pay

## 2007-11-05 LAB — CONVERTED CEMR LAB: Blood Glucose, Fingerstick: 153

## 2007-11-06 LAB — CONVERTED CEMR LAB
ALT: 20 units/L (ref 0–35)
Albumin: 4.3 g/dL (ref 3.5–5.2)
Alkaline Phosphatase: 96 units/L (ref 39–117)
BUN: 23 mg/dL (ref 6–23)
Calcium: 9 mg/dL (ref 8.4–10.5)
Chloride: 104 meq/L (ref 96–112)
Glucose, Bld: 128 mg/dL — ABNORMAL HIGH (ref 70–99)
Microalb Creat Ratio: 74.5 mg/g — ABNORMAL HIGH (ref 0.0–30.0)
Microalb, Ur: 20.1 mg/dL — ABNORMAL HIGH (ref 0.00–1.89)
Total Protein: 8.3 g/dL (ref 6.0–8.3)

## 2007-11-12 ENCOUNTER — Encounter: Payer: Self-pay | Admitting: Internal Medicine

## 2007-11-12 ENCOUNTER — Ambulatory Visit: Payer: Self-pay | Admitting: *Deleted

## 2007-11-25 ENCOUNTER — Ambulatory Visit: Payer: Self-pay | Admitting: Cardiovascular Disease

## 2007-11-25 LAB — CONVERTED CEMR LAB
HDL: 44 mg/dL (ref >39)
LDL Cholesterol: 72 mg/dL (ref 0–99)
Triglycerides: 358 mg/dL — ABNORMAL HIGH (ref <150)

## 2007-11-27 ENCOUNTER — Telehealth: Payer: Self-pay | Admitting: Internal Medicine

## 2008-01-27 ENCOUNTER — Encounter (INDEPENDENT_AMBULATORY_CARE_PROVIDER_SITE_OTHER): Payer: Self-pay | Admitting: Internal Medicine

## 2008-02-03 ENCOUNTER — Telehealth: Payer: Self-pay | Admitting: Internal Medicine

## 2008-02-17 ENCOUNTER — Telehealth: Payer: Self-pay | Admitting: Internal Medicine

## 2008-02-25 ENCOUNTER — Telehealth (INDEPENDENT_AMBULATORY_CARE_PROVIDER_SITE_OTHER): Payer: Self-pay | Admitting: Internal Medicine

## 2008-02-26 ENCOUNTER — Ambulatory Visit (HOSPITAL_COMMUNITY): Admission: RE | Admit: 2008-02-26 | Discharge: 2008-02-26 | Payer: Self-pay | Admitting: Internal Medicine

## 2008-02-26 ENCOUNTER — Encounter (INDEPENDENT_AMBULATORY_CARE_PROVIDER_SITE_OTHER): Payer: Self-pay | Admitting: Internal Medicine

## 2008-02-26 ENCOUNTER — Ambulatory Visit: Payer: Self-pay | Admitting: Internal Medicine

## 2008-02-26 LAB — CONVERTED CEMR LAB: Hgb A1c MFr Bld: 7.2 %

## 2008-03-04 ENCOUNTER — Ambulatory Visit: Payer: Self-pay | Admitting: Sports Medicine

## 2008-03-16 ENCOUNTER — Encounter (INDEPENDENT_AMBULATORY_CARE_PROVIDER_SITE_OTHER): Payer: Self-pay | Admitting: *Deleted

## 2008-04-15 ENCOUNTER — Telehealth: Payer: Self-pay | Admitting: Internal Medicine

## 2008-04-16 ENCOUNTER — Telehealth: Payer: Self-pay | Admitting: Internal Medicine

## 2008-04-19 ENCOUNTER — Telehealth: Payer: Self-pay | Admitting: Internal Medicine

## 2008-05-05 ENCOUNTER — Encounter: Payer: Self-pay | Admitting: Internal Medicine

## 2008-05-05 ENCOUNTER — Ambulatory Visit: Payer: Self-pay | Admitting: Internal Medicine

## 2008-05-05 LAB — CONVERTED CEMR LAB
Alkaline Phosphatase: 88 units/L (ref 39–117)
Basophils Absolute: 0 10*3/uL (ref 0.0–0.1)
Calcium: 9.8 mg/dL (ref 8.4–10.5)
Cholesterol: 168 mg/dL (ref 0–200)
Creatinine, Ser: 0.96 mg/dL (ref 0.40–1.20)
Eosinophils Absolute: 0.1 10*3/uL (ref 0.0–0.7)
Eosinophils Relative: 1 % (ref 0–5)
Glucose, Bld: 144 mg/dL — ABNORMAL HIGH (ref 70–99)
HCT: 33.8 % — ABNORMAL LOW (ref 36.0–46.0)
HDL: 36 mg/dL — ABNORMAL LOW (ref >39)
Hemoglobin: 10.9 g/dL — ABNORMAL LOW (ref 12.0–15.0)
LDL Cholesterol: 64 mg/dL (ref 0–99)
Lymphs Abs: 2.7 10*3/uL (ref 0.7–4.0)
MCHC: 32.2 g/dL (ref 30.0–36.0)
MCV: 83.5 fL (ref 78.0–100.0)
Monocytes Relative: 4 % (ref 3–12)
Neutro Abs: 7.8 10*3/uL — ABNORMAL HIGH (ref 1.7–7.7)
Neutrophils Relative %: 71 % (ref 43–77)
Platelets: 372 10*3/uL (ref 150–400)
RBC: 4.05 M/uL (ref 3.87–5.11)
RDW: 14.1 % (ref 11.5–15.5)
Sodium: 139 meq/L (ref 135–145)
Total Bilirubin: 0.3 mg/dL (ref 0.3–1.2)

## 2008-05-06 ENCOUNTER — Encounter: Payer: Self-pay | Admitting: Internal Medicine

## 2008-05-10 ENCOUNTER — Encounter: Payer: Self-pay | Admitting: Internal Medicine

## 2008-05-11 ENCOUNTER — Telehealth: Payer: Self-pay | Admitting: *Deleted

## 2008-05-24 ENCOUNTER — Ambulatory Visit: Payer: Self-pay | Admitting: *Deleted

## 2008-05-25 ENCOUNTER — Encounter: Payer: Self-pay | Admitting: Internal Medicine

## 2008-06-04 ENCOUNTER — Ambulatory Visit: Payer: Self-pay | Admitting: Internal Medicine

## 2008-06-04 ENCOUNTER — Encounter (INDEPENDENT_AMBULATORY_CARE_PROVIDER_SITE_OTHER): Payer: Self-pay | Admitting: *Deleted

## 2008-06-08 ENCOUNTER — Encounter: Payer: Self-pay | Admitting: Internal Medicine

## 2008-06-10 ENCOUNTER — Ambulatory Visit: Payer: Self-pay | Admitting: Internal Medicine

## 2008-06-10 LAB — CONVERTED CEMR LAB
Basophils Absolute: 0.1 10*3/uL (ref 0.0–0.1)
Basophils Relative: 0.6 % (ref 0.0–3.0)
Eosinophils Absolute: 0.1 10*3/uL (ref 0.0–0.7)
Ferritin: 35.1 ng/mL (ref 10.0–291.0)
Folate: 9.9 ng/mL
Iron: 60 ug/dL (ref 42–145)
Lymphs Abs: 2.3 10*3/uL (ref 0.7–4.0)
Monocytes Absolute: 0.3 10*3/uL (ref 0.1–1.0)
Monocytes Relative: 3 % (ref 3.0–12.0)
RDW: 13.4 % (ref 11.5–14.6)
Saturation Ratios: 12.6 % — ABNORMAL LOW (ref 20.0–50.0)
Transferrin: 340.6 mg/dL (ref 212.0–360.0)
WBC: 11.5 10*3/uL — ABNORMAL HIGH (ref 4.5–10.5)

## 2008-06-11 ENCOUNTER — Telehealth: Payer: Self-pay | Admitting: Internal Medicine

## 2008-06-14 ENCOUNTER — Ambulatory Visit: Payer: Self-pay | Admitting: Internal Medicine

## 2008-06-14 ENCOUNTER — Telehealth: Payer: Self-pay | Admitting: *Deleted

## 2008-06-15 ENCOUNTER — Ambulatory Visit: Payer: Self-pay | Admitting: *Deleted

## 2008-06-25 ENCOUNTER — Telehealth (INDEPENDENT_AMBULATORY_CARE_PROVIDER_SITE_OTHER): Payer: Self-pay | Admitting: *Deleted

## 2008-06-29 ENCOUNTER — Ambulatory Visit: Payer: Self-pay | Admitting: Internal Medicine

## 2008-06-29 ENCOUNTER — Ambulatory Visit (HOSPITAL_COMMUNITY): Admission: RE | Admit: 2008-06-29 | Discharge: 2008-06-29 | Payer: Self-pay | Admitting: Internal Medicine

## 2008-06-29 ENCOUNTER — Encounter: Payer: Self-pay | Admitting: Internal Medicine

## 2008-06-29 LAB — HM COLONOSCOPY

## 2008-07-01 ENCOUNTER — Ambulatory Visit: Payer: Self-pay | Admitting: Internal Medicine

## 2008-07-02 ENCOUNTER — Ambulatory Visit: Payer: Self-pay | Admitting: Internal Medicine

## 2008-07-08 ENCOUNTER — Encounter (INDEPENDENT_AMBULATORY_CARE_PROVIDER_SITE_OTHER): Payer: Self-pay

## 2008-08-02 ENCOUNTER — Telehealth: Payer: Self-pay | Admitting: Internal Medicine

## 2008-08-06 ENCOUNTER — Telehealth: Payer: Self-pay | Admitting: Internal Medicine

## 2008-09-09 ENCOUNTER — Telehealth: Payer: Self-pay | Admitting: Internal Medicine

## 2008-09-21 ENCOUNTER — Encounter: Payer: Self-pay | Admitting: Internal Medicine

## 2008-09-21 ENCOUNTER — Ambulatory Visit: Payer: Self-pay | Admitting: Internal Medicine

## 2008-09-21 ENCOUNTER — Telehealth: Payer: Self-pay | Admitting: *Deleted

## 2008-09-21 LAB — CONVERTED CEMR LAB
Basophils Absolute: 0 10*3/uL (ref 0.0–0.1)
Basophils Relative: 0 % (ref 0–1)
Blood Glucose, Fingerstick: 214
Eosinophils Relative: 1 % (ref 0–5)
HCT: 32.3 % — ABNORMAL LOW (ref 36.0–46.0)
Hemoglobin: 11 g/dL — ABNORMAL LOW (ref 12.0–15.0)
Hgb A1c MFr Bld: 9 %
Lymphocytes Relative: 25 % (ref 12–46)
Lymphs Abs: 2.9 10*3/uL (ref 0.7–4.0)
MCHC: 34.1 g/dL (ref 30.0–36.0)
Monocytes Absolute: 0.4 10*3/uL (ref 0.1–1.0)
Monocytes Relative: 4 % (ref 3–12)
Neutrophils Relative %: 70 % (ref 43–77)
Platelets: 301 10*3/uL (ref 150–400)
Prothrombin Time: 13.9 s (ref 11.6–15.2)
RBC: 3.92 M/uL (ref 3.87–5.11)
RDW: 14.9 % (ref 11.5–15.5)
WBC: 11.6 10*3/uL — ABNORMAL HIGH (ref 4.0–10.5)
aPTT: 28 s (ref 24–37)

## 2008-10-01 ENCOUNTER — Emergency Department (HOSPITAL_COMMUNITY): Admission: EM | Admit: 2008-10-01 | Discharge: 2008-10-01 | Payer: Self-pay | Admitting: Emergency Medicine

## 2008-10-02 ENCOUNTER — Encounter: Payer: Self-pay | Admitting: Internal Medicine

## 2008-10-02 ENCOUNTER — Other Ambulatory Visit: Payer: Self-pay | Admitting: Emergency Medicine

## 2008-10-02 ENCOUNTER — Telehealth (INDEPENDENT_AMBULATORY_CARE_PROVIDER_SITE_OTHER): Payer: Self-pay | Admitting: Internal Medicine

## 2008-10-03 ENCOUNTER — Ambulatory Visit: Payer: Self-pay | Admitting: Internal Medicine

## 2008-10-03 ENCOUNTER — Other Ambulatory Visit: Payer: Self-pay | Admitting: Emergency Medicine

## 2008-10-03 ENCOUNTER — Ambulatory Visit: Payer: Self-pay | Admitting: Gastroenterology

## 2008-10-03 ENCOUNTER — Inpatient Hospital Stay (HOSPITAL_COMMUNITY): Admission: EM | Admit: 2008-10-03 | Discharge: 2008-10-07 | Payer: Self-pay | Admitting: Internal Medicine

## 2008-10-07 ENCOUNTER — Encounter (INDEPENDENT_AMBULATORY_CARE_PROVIDER_SITE_OTHER): Payer: Self-pay | Admitting: Internal Medicine

## 2008-10-08 ENCOUNTER — Telehealth (INDEPENDENT_AMBULATORY_CARE_PROVIDER_SITE_OTHER): Payer: Self-pay | Admitting: *Deleted

## 2008-10-13 ENCOUNTER — Encounter (INDEPENDENT_AMBULATORY_CARE_PROVIDER_SITE_OTHER): Payer: Self-pay | Admitting: Internal Medicine

## 2008-10-14 ENCOUNTER — Ambulatory Visit: Payer: Self-pay | Admitting: Internal Medicine

## 2008-10-14 ENCOUNTER — Encounter (INDEPENDENT_AMBULATORY_CARE_PROVIDER_SITE_OTHER): Payer: Self-pay | Admitting: Internal Medicine

## 2008-10-22 LAB — CONVERTED CEMR LAB
Chloride: 99 meq/L (ref 96–112)
Creatinine, Ser: 1.9 mg/dL — ABNORMAL HIGH (ref 0.40–1.20)
Glucose, Bld: 178 mg/dL — ABNORMAL HIGH (ref 70–99)

## 2008-11-05 ENCOUNTER — Ambulatory Visit: Payer: Self-pay | Admitting: Internal Medicine

## 2008-11-05 ENCOUNTER — Encounter (INDEPENDENT_AMBULATORY_CARE_PROVIDER_SITE_OTHER): Payer: Self-pay | Admitting: Internal Medicine

## 2008-11-05 LAB — CONVERTED CEMR LAB
Glucose, Bld: 189 mg/dL — ABNORMAL HIGH (ref 70–99)
Potassium: 4.3 meq/L (ref 3.5–5.3)

## 2008-11-09 ENCOUNTER — Ambulatory Visit: Payer: Self-pay | Admitting: Internal Medicine

## 2008-11-11 ENCOUNTER — Telehealth: Payer: Self-pay | Admitting: Internal Medicine

## 2008-11-15 ENCOUNTER — Encounter (INDEPENDENT_AMBULATORY_CARE_PROVIDER_SITE_OTHER): Payer: Self-pay | Admitting: *Deleted

## 2008-11-16 ENCOUNTER — Ambulatory Visit (HOSPITAL_COMMUNITY): Admission: RE | Admit: 2008-11-16 | Discharge: 2008-11-16 | Payer: Self-pay | Admitting: Internal Medicine

## 2008-11-25 ENCOUNTER — Telehealth (INDEPENDENT_AMBULATORY_CARE_PROVIDER_SITE_OTHER): Payer: Self-pay | Admitting: *Deleted

## 2008-11-25 ENCOUNTER — Telehealth: Payer: Self-pay | Admitting: Internal Medicine

## 2008-12-01 ENCOUNTER — Telehealth: Payer: Self-pay | Admitting: Internal Medicine

## 2008-12-01 LAB — CONVERTED CEMR LAB
CO2: 22 meq/L (ref 19–32)
Chloride: 104 meq/L (ref 96–112)
Creatinine, Ser: 1.42 mg/dL — ABNORMAL HIGH (ref 0.40–1.20)
Ferritin: 52 ng/mL (ref 10–291)
Glucose, Bld: 153 mg/dL — ABNORMAL HIGH (ref 70–99)
MCHC: 31.6 g/dL (ref 30.0–36.0)
Platelets: 230 10*3/uL (ref 150–400)
Potassium: 4.4 meq/L (ref 3.5–5.3)

## 2008-12-02 ENCOUNTER — Encounter: Payer: Self-pay | Admitting: Internal Medicine

## 2008-12-02 ENCOUNTER — Ambulatory Visit: Payer: Self-pay | Admitting: Infectious Diseases

## 2008-12-03 LAB — CONVERTED CEMR LAB
BUN: 26 mg/dL — ABNORMAL HIGH (ref 6–23)
Basophils Absolute: 0 10*3/uL (ref 0.0–0.1)
Basophils Relative: 0 % (ref 0–1)
CO2: 24 meq/L (ref 19–32)
Chloride: 102 meq/L (ref 96–112)
Eosinophils Absolute: 0.1 10*3/uL (ref 0.0–0.7)
HCT: 30.9 % — ABNORMAL LOW (ref 36.0–46.0)
Lymphocytes Relative: 23 % (ref 12–46)
Lymphs Abs: 2.5 10*3/uL (ref 0.7–4.0)
MCHC: 32.4 g/dL (ref 30.0–36.0)
MCV: 83.5 fL (ref >=78.0)
Monocytes Absolute: 0.4 10*3/uL (ref 0.1–1.0)
Neutro Abs: 7.7 10*3/uL (ref 1.7–7.7)
Platelets: 320 10*3/uL (ref 150–400)
RBC: 3.7 M/uL — ABNORMAL LOW (ref 3.87–5.11)
RDW: 14.9 % (ref 11.5–15.5)
Sodium: 140 meq/L (ref 135–145)
Vitamin B-12: 524 pg/mL (ref 211–911)
WBC: 10.7 10*3/uL — ABNORMAL HIGH (ref 4.0–10.5)

## 2008-12-06 ENCOUNTER — Ambulatory Visit: Payer: Self-pay | Admitting: Internal Medicine

## 2008-12-09 ENCOUNTER — Ambulatory Visit: Payer: Self-pay | Admitting: Internal Medicine

## 2008-12-16 ENCOUNTER — Ambulatory Visit: Payer: Self-pay | Admitting: Infectious Diseases

## 2008-12-16 LAB — CONVERTED CEMR LAB: Hgb A1c MFr Bld: 8 %

## 2008-12-27 ENCOUNTER — Ambulatory Visit: Payer: Self-pay | Admitting: Internal Medicine

## 2008-12-30 ENCOUNTER — Emergency Department (HOSPITAL_COMMUNITY): Admission: EM | Admit: 2008-12-30 | Discharge: 2008-12-30 | Payer: Self-pay | Admitting: Emergency Medicine

## 2009-01-03 ENCOUNTER — Ambulatory Visit: Payer: Self-pay | Admitting: Internal Medicine

## 2009-01-03 ENCOUNTER — Encounter: Payer: Self-pay | Admitting: Licensed Clinical Social Worker

## 2009-01-03 DIAGNOSIS — M48062 Spinal stenosis, lumbar region with neurogenic claudication: Secondary | ICD-10-CM | POA: Insufficient documentation

## 2009-01-04 ENCOUNTER — Telehealth: Payer: Self-pay | Admitting: Internal Medicine

## 2009-01-19 DIAGNOSIS — K219 Gastro-esophageal reflux disease without esophagitis: Secondary | ICD-10-CM | POA: Insufficient documentation

## 2009-01-21 ENCOUNTER — Ambulatory Visit: Payer: Self-pay | Admitting: Cardiovascular Disease

## 2009-02-22 ENCOUNTER — Telehealth: Payer: Self-pay | Admitting: Internal Medicine

## 2009-03-04 ENCOUNTER — Telehealth: Payer: Self-pay | Admitting: Internal Medicine

## 2009-03-16 ENCOUNTER — Telehealth (INDEPENDENT_AMBULATORY_CARE_PROVIDER_SITE_OTHER): Payer: Self-pay | Admitting: *Deleted

## 2009-03-22 ENCOUNTER — Telehealth: Payer: Self-pay | Admitting: Internal Medicine

## 2009-04-11 ENCOUNTER — Ambulatory Visit: Payer: Self-pay | Admitting: Internal Medicine

## 2009-04-11 LAB — CONVERTED CEMR LAB: Hgb A1c MFr Bld: 13.1 %

## 2009-04-13 LAB — CONVERTED CEMR LAB
Basophils Absolute: 0 10*3/uL (ref 0.0–0.1)
Chloride: 95 meq/L — ABNORMAL LOW (ref 96–112)
Creatinine, Urine: 153 mg/dL
Eosinophils Relative: 1 % (ref 0–5)
Glucose, Bld: 393 mg/dL — ABNORMAL HIGH (ref 70–99)
HCT: 31.5 % — ABNORMAL LOW (ref 36.0–46.0)
Hemoglobin: 10.9 g/dL — ABNORMAL LOW (ref 12.0–15.0)
Microalb Creat Ratio: 52.4 mg/g — ABNORMAL HIGH (ref 0.0–30.0)
Microalb, Ur: 8.01 mg/dL — ABNORMAL HIGH (ref 0.00–1.89)
Monocytes Absolute: 0.5 10*3/uL (ref 0.1–1.0)
RDW: 15.4 % (ref 11.5–15.5)
Sodium: 129 meq/L — ABNORMAL LOW (ref 135–145)

## 2009-04-18 ENCOUNTER — Telehealth: Payer: Self-pay | Admitting: Internal Medicine

## 2009-04-21 ENCOUNTER — Ambulatory Visit: Payer: Self-pay | Admitting: Internal Medicine

## 2009-04-25 ENCOUNTER — Telehealth (INDEPENDENT_AMBULATORY_CARE_PROVIDER_SITE_OTHER): Payer: Self-pay | Admitting: *Deleted

## 2009-04-28 ENCOUNTER — Encounter: Payer: Self-pay | Admitting: Internal Medicine

## 2009-04-28 ENCOUNTER — Ambulatory Visit: Payer: Self-pay | Admitting: Internal Medicine

## 2009-05-06 ENCOUNTER — Telehealth: Payer: Self-pay | Admitting: *Deleted

## 2009-05-06 ENCOUNTER — Telehealth: Payer: Self-pay | Admitting: Internal Medicine

## 2009-06-01 ENCOUNTER — Encounter (INDEPENDENT_AMBULATORY_CARE_PROVIDER_SITE_OTHER): Payer: Self-pay | Admitting: *Deleted

## 2009-06-02 ENCOUNTER — Encounter (INDEPENDENT_AMBULATORY_CARE_PROVIDER_SITE_OTHER): Payer: Self-pay | Admitting: Internal Medicine

## 2009-06-02 ENCOUNTER — Ambulatory Visit: Payer: Self-pay | Admitting: Internal Medicine

## 2009-06-02 LAB — CONVERTED CEMR LAB: Blood Glucose, AC Bkfst: 109 mg/dL

## 2009-06-06 LAB — CONVERTED CEMR LAB
Cholesterol: 213 mg/dL — ABNORMAL HIGH (ref 0–200)
HDL: 33 mg/dL — ABNORMAL LOW (ref >39)
Total CHOL/HDL Ratio: 6.5

## 2009-06-07 ENCOUNTER — Telehealth: Payer: Self-pay | Admitting: Internal Medicine

## 2009-06-08 ENCOUNTER — Encounter: Payer: Self-pay | Admitting: Internal Medicine

## 2009-06-08 LAB — HM DIABETES EYE EXAM

## 2009-06-27 ENCOUNTER — Telehealth: Payer: Self-pay | Admitting: Internal Medicine

## 2009-06-27 ENCOUNTER — Encounter: Payer: Self-pay | Admitting: Internal Medicine

## 2009-08-16 ENCOUNTER — Ambulatory Visit: Payer: Self-pay | Admitting: Internal Medicine

## 2009-08-29 ENCOUNTER — Ambulatory Visit: Payer: Self-pay | Admitting: Internal Medicine

## 2009-08-30 LAB — CONVERTED CEMR LAB
Alkaline Phosphatase: 110 units/L (ref 39–117)
BUN: 36 mg/dL — ABNORMAL HIGH (ref 6–23)
Creatinine, Ser: 1.27 mg/dL — ABNORMAL HIGH (ref 0.40–1.20)
Glucose, Bld: 84 mg/dL (ref 70–99)
HDL: 33 mg/dL — ABNORMAL LOW (ref >39)
LDL Cholesterol: 70 mg/dL (ref 0–99)
Sodium: 139 meq/L (ref 135–145)
Total Bilirubin: 0.4 mg/dL (ref 0.3–1.2)
Total CHOL/HDL Ratio: 5.4
Triglycerides: 368 mg/dL — ABNORMAL HIGH (ref <150)
VLDL: 74 mg/dL — ABNORMAL HIGH (ref 0–40)

## 2009-09-06 ENCOUNTER — Telehealth: Payer: Self-pay | Admitting: Internal Medicine

## 2009-09-12 ENCOUNTER — Telehealth: Payer: Self-pay | Admitting: Internal Medicine

## 2009-09-26 ENCOUNTER — Telehealth: Payer: Self-pay | Admitting: Internal Medicine

## 2009-10-06 ENCOUNTER — Encounter: Payer: Self-pay | Admitting: Internal Medicine

## 2009-10-25 ENCOUNTER — Ambulatory Visit: Payer: Self-pay | Admitting: Internal Medicine

## 2009-10-26 ENCOUNTER — Telehealth: Payer: Self-pay | Admitting: Internal Medicine

## 2009-10-28 ENCOUNTER — Telehealth: Payer: Self-pay | Admitting: Internal Medicine

## 2009-11-17 ENCOUNTER — Ambulatory Visit (HOSPITAL_COMMUNITY): Admission: RE | Admit: 2009-11-17 | Discharge: 2009-11-17 | Payer: Self-pay | Admitting: Internal Medicine

## 2009-11-17 LAB — HM MAMMOGRAPHY: HM Mammogram: NEGATIVE

## 2009-11-25 ENCOUNTER — Encounter: Payer: Self-pay | Admitting: *Deleted

## 2009-12-01 ENCOUNTER — Telehealth: Payer: Self-pay | Admitting: Internal Medicine

## 2009-12-09 ENCOUNTER — Telehealth: Payer: Self-pay | Admitting: Internal Medicine

## 2010-01-03 ENCOUNTER — Ambulatory Visit (HOSPITAL_COMMUNITY): Admission: RE | Admit: 2010-01-03 | Discharge: 2010-01-03 | Payer: Self-pay | Admitting: Internal Medicine

## 2010-01-03 ENCOUNTER — Ambulatory Visit: Payer: Self-pay | Admitting: Internal Medicine

## 2010-01-04 LAB — CONVERTED CEMR LAB
BUN: 25 mg/dL — ABNORMAL HIGH (ref 6–23)
Basophils Relative: 0 % (ref 0–1)
Chloride: 106 meq/L (ref 96–112)
Creatinine, Ser: 1.47 mg/dL — ABNORMAL HIGH (ref 0.40–1.20)
Eosinophils Absolute: 0.1 10*3/uL (ref 0.0–0.7)
Glucose, Bld: 138 mg/dL — ABNORMAL HIGH (ref 70–99)
Hemoglobin: 8.8 g/dL — ABNORMAL LOW (ref 12.0–15.0)
Lymphs Abs: 1.6 10*3/uL (ref 0.7–4.0)
MCHC: 31.7 g/dL (ref 30.0–36.0)
MCV: 81 fL (ref >=78.0)
Monocytes Absolute: 0.6 10*3/uL (ref 0.1–1.0)
Monocytes Relative: 6 % (ref 3–12)
RBC: 3.43 M/uL — ABNORMAL LOW (ref 3.87–5.11)

## 2010-01-09 ENCOUNTER — Encounter: Payer: Self-pay | Admitting: Internal Medicine

## 2010-02-07 ENCOUNTER — Ambulatory Visit: Payer: Self-pay | Admitting: Internal Medicine

## 2010-02-07 DIAGNOSIS — M25569 Pain in unspecified knee: Secondary | ICD-10-CM | POA: Insufficient documentation

## 2010-02-07 LAB — CONVERTED CEMR LAB
Blood Glucose, Fingerstick: 128
Hgb A1c MFr Bld: 6.3 %

## 2010-03-21 ENCOUNTER — Telehealth: Payer: Self-pay | Admitting: Internal Medicine

## 2010-04-18 NOTE — Progress Notes (Signed)
Summary: Refill/gh  Phone Note Refill Request Message from:  Fax from Pharmacy on September 06, 2009 2:24 PM  Refills Requested: Medication #1:  LIPITOR 20 MG TABS take 1 tablet by mouth daily.   Last Refilled: 05/06/2009  Method Requested: Electronic Initial call taken by: Sander Nephew RN,  September 06, 2009 2:24 PM  Follow-up for Phone Call        FLP and CMP just checked and nl.  Will refill for 1year. Follow-up by: Larey Dresser MD,  September 06, 2009 3:15 PM    Prescriptions: LIPITOR 20 MG TABS (ATORVASTATIN CALCIUM) take 1 tablet by mouth daily.  #30 x 11   Entered and Authorized by:   Larey Dresser MD   Signed by:   Larey Dresser MD on 09/06/2009   Method used:   Faxed to ...       [facility] Department (retail)       [address] [place], [place]  [address]       Ph: [phone]       Fax: [phone]   RxID:   [id number]

## 2010-04-18 NOTE — Progress Notes (Signed)
Summary: med change/gp  Phone Note Refill Request Message from:  Fax from Pharmacy on October 28, 2009 4:03 PM  Refills Requested: Medication #1:  LISINOPRIL 10 MG TABS One by mouth once daily for high blood pressure and to protect your kidneys. [facility] pharmacy states they can obtain Accupril 9m daily  at no charge if you decide to change medication.  Thanks   Method Requested: Telephone to Pharmacy Initial call taken by: GMorrison OldRN,  October 28, 2009 4:03 PM    New/Updated Medications: QUINAPRIL HCL 10 MG TABS (QUINAPRIL HCL) One tablet by mouth once a day Prescriptions: QUINAPRIL HCL 10 MG TABS (QUINAPRIL HCL) One tablet by mouth once a day  #30 x 5   Entered and Authorized by:   LOval LinseyMD   Signed by:   LOval LinseyMD on 10/28/2009   Method used:   [facility]       [facility]       [address] [place]  [address]      Ph: [phone]      Fax: [phone]  RxID:   [id number]  Appended Document: med change/gp Quinapril  [facility]

## 2010-04-18 NOTE — Letter (Signed)
Summary: Appointment - Reminder [place], [place]  [address] [place]   [place], [place] [address]   Phone: [phone]  Fax: [phone]     June 01, 2009 MRN: [id number]   [name] [address] [place], [place]  [address]   Dear [name],  Our records indicate that it is time to schedule a follow-up appointment with Dr. Johnsie Cancel. It is very important that we reach you to schedule this appointment. We look forward to participating in your health care needs. Please contact us at the number listed above at your earliest convenience to schedule your appointment.  If you are unable to make an appointment at this time, give [place] a call so we can update our records.     Sincerely,   Darnell Level [facility] Scheduling Team

## 2010-04-18 NOTE — Assessment & Plan Note (Signed)
Summary: routine f/u diabetes//kg   Vital Signs:  Patient profile:   65 year old female Height:      64 inches (162.56 cm) Weight:      191.02 pounds (86.83 kg) BMI:     32.91 Temp:     98 degrees F (36.67 degrees C) oral Pulse rate:   81 / minute BP sitting:   132 / 76  (right arm)  Vitals Entered By: Sander Nephew RN (April 11, 2009 2:12 PM) Is Patient Diabetic? Yes Did you bring your meter with you today? No Pain Assessment Patient in pain? yes     Location: side of left hip side Intensity: 8 Type: soreness Onset of pain  Contiunes all the time , unable to put children on that side Nutritional Status BMI of > 30 = obese CBG Result 436  Have you ever been in a relationship where you felt threatened, hurt or afraid?No   Does patient need assistance? Functional Status Self care Ambulation Normal Comments Check up.  Needs Nitroglycerine refill.   Primary Care Provider:  Niel Hummer MD   History of Present Illness: 65 year old with Past Medical History: MI (ICD-410.90) HYPERTENSION (ICD-401.9) CORONARY ARTERY DISEASE, S/P PTCA, DES, ON PLAVIX (ICD-414.9) GERD (ICD-530.81) HYPERCHOLESTEROLEMIA (ICD-272.0) RENAL INSUFFICIENCY (ICD-588.9) SPINAL STENOSIS, LUMBAR (ICD-724.02) FAMILY HISTORY DIABETES 1ST DEGREE RELATIVE (ICD-V18.0) HYPERKALEMIA (ICD-276.7) RENAL INSUFFICIENCY, ACUTE (ICD-585.9) BLEEDING DISORDER (ICD-287.9) ANEMIA, B12 DEFICIENCY (ICD-281.1) HIP PAIN, LEFT (ICD-719.45) KNEE PAIN, LEFT, CHRONIC (ICD-719.46) ANEMIA NOS (ICD-285.9) DM, UNCOMPLICATED, TYPE II (WYO-378.58) HYPERLIPIDEMIA (ICD-272.4) DIARRHEA, CHRONIC (ICD-787.91) PREVENTIVE HEALTH CARE (ICD-V70.0) FAMILY HSITORY BREAST CANCER 1ST DEGREE RELATIVE <50 (ICD-V16.3) OBESITY, MORBID (ICD-278.01)  She has been checking blood sugar, she relates blood has been running 76 to 200 ????!!!!!!  the highest fasting.  She is checking blood sugar twice a day. She didnt bring meter. She started  to take actos since December. She denies hypoglycemic symptoms. She relates that she had a soda this morning thats maybe why her blood sugar is high.?????!!!!!! She denies abdominal pain, dyspnea, chest pain, dysuria. She denies blood in the stool, no melena. She is taking iron.  She has been drinking a lot of juice.  pharmacy phone [phone]  Depression History:      The patient denies diminished interest in her usual daily activities.         Preventive Screening-Counseling & Management  Alcohol-Tobacco     Alcohol drinks/day: 0     Smoking Status: never  Current Medications (verified): 1)  Plavix 75 Mg Tabs (Clopidogrel Bisulfate) .... Take 1 Tablet By Mouth Once A Day 2)  Catapres 0.2 Mg Tabs (Clonidine Hcl) .... Take 1 Tablet By Mouth Three Times A Day 3)  Welchol 625 Mg Tabs (Colesevelam Hcl) .... 3 Tablets Once Daily 4)  Atenolol 50 Mg Tabs (Atenolol) .... Take 1 Tablet By Mouth Two Times A Day 5)  Fexofenadine Hcl 180 Mg Tabs (Fexofenadine Hcl) .... Take 1 Tablet By Mouth Once A Day As Needed 6)  Hydroxyzine Hcl 50 Mg Tabs (Hydroxyzine Hcl) .... Take 1 Tablet By Mouth Once A Day As Needed 7)  Nitrostat 0.4 Mg Subl (Nitroglycerin) .... As Needed 8)  Bayer Low Strength 81 Mg Tbec (Aspirin) .... Take 1 Tablet By Mouth Once A Day 9)  Norvasc 10 Mg  Tabs (Amlodipine Besylate) .... Take 1 Tablet By Mouth Daily. 10)  Cvs Iron 325 (65 Fe) Mg  Tabs (Ferrous Sulfate) .... Take 1 Tablet By Mouth Three Times A Day  11)  Metformin Hcl 500 Mg  Tb24 (Metformin Hcl) .... Take 2  Tablet Twice A Day. 12)  1st Choice Lancets Thin   Misc (Lancets) 13)  Lipitor 20 Mg Tabs (Atorvastatin Calcium) .... Take 1 Tablet By Mouth Daily. 14)  Glucotrol 10 Mg Tabs (Glipizide) .... Take 2 Tablets in The Morning and 1 Tablet in The Evening By Mouth 15)  Truetrack Test   Strp (Glucose Blood) .... Use To Test Blood Sugar Twice Daily 16)  Lancets   Misc (Lancets) .... Use To Test Blood Glucose Twice Daily 17)   Cobal-1000 1000 Mcg/ml Soln (Cyanocobalamin) .... Inject Im Daily For 1 Week Then 62m Every Week For 4 Weeks. 18)  Vicodin 5-500 Mg Tabs (Hydrocodone-Acetaminophen) .... Take 1 Tablet By Mouth Two Times A Day As Needed For Pain 19)  Promethazine Hcl 25 Mg Tabs (Promethazine Hcl) .... Take 1 Tablet By Mouth Prn 20)  B-12 Monthly 21)  Lantus 100 Unit/ml Soln (Insulin Glargine) .... Inject 12  Units Subq At Bedtime. 22)  Insulin Syringe 28g X 1/2" 1 Ml Misc (Insulin Syringe-Needle U-100) .... Use To Inject Insulin As Instructed. 23)  Vitamin B-12 250 Mcg Tabs (Cyanocobalamin) .... Take 1 Tablet By Mouth Daily. 24)  Actos 15 Mg Tabs (Pioglitazone Hcl) .... Take 1 Tablet By Mouth Twice A Day.  Allergies: 1)  ! Valium (Diazepam) 2)  ! Morphine Sulfate (Morphine Sulfate) 3)  ! Haldol 4)  ! Darvon 5)  ! Ativan 6)  ! Tramadol Hcl  Review of Systems  The patient denies fever, chest pain, syncope, dyspnea on exertion, peripheral edema, prolonged cough, hemoptysis, abdominal pain, melena, and hematochezia.    Physical Exam  General:  alert, well-developed, and well-nourished.   Head:  normocephalic, atraumatic, and no abnormalities observed.   Lungs:  normal respiratory effort, no intercostal retractions, no accessory muscle use, and normal breath sounds.   Heart:  normal rate and regular rhythm.   Abdomen:  soft, non-tender, normal bowel sounds, no distention, and no masses.   Neurologic:  alert & oriented X3, cranial nerves II-XII intact, and strength normal in all extremities.     Impression & Recommendations:  Problem # 1:  DM, UNCOMPLICATED, TYPE II (IRDE-081.44 Her HbA1c has increse to 13 from 8. She relates diet indiscration, drinking juice and sodas. She relates that she has been taking her medication. She got the refill for actos, and has been taking it since december. I will start lantus today, I provied prescription. She will follow with DWyman Songsterfor insulin use education.  I advised  her to stop actos onces she start using insulin to avoid hypoglycemia. i will see her in 2 weeeks for insulin dose  titration.  Her updated medication list for this problem includes:    Bayer Low Strength 81 Mg Tbec (Aspirin) ..Marland Kitchen.. Take 1 tablet by mouth once a day    Metformin Hcl 500 Mg Tb24 (Metformin hcl) ..Marland Kitchen.. Take 2  tablet twice a day.    Glucotrol 10 Mg Tabs (Glipizide) ..Marland Kitchen.. Take 2 tablets in the morning and 1 tablet in the evening by mouth    Lantus 100 Unit/ml Soln (Insulin glargine) ..... Inject 12  units subq at bedtime.    Actos 15 Mg Tabs (Pioglitazone hcl) ..Marland Kitchen.. Take 1 tablet by mouth twice a day.  Orders: T- Capillary Blood Glucose (([id number] T-Hgb A1C (in-house) ([id number] T-Basic Metabolic Panel ([id number] T-Urine Microalbumin w/creat. ratio ([phone] Ophthalmology Referral (Ophthalmology)  Labs Reviewed: Creat: 0.97 (12/02/2008)  Last Eye Exam: No diabetic retinopathy.   Vascular changes consistent with hypertension, venous tortuosity (01/27/2008) Reviewed HgBA1c results: 13.1 (04/11/2009)  8.0 (12/16/2008)  Problem # 2:  HYPERTENSION (ICD-401.9) Her blood pressure better controlled, almost at goal. Continue with current management.  Her updated medication list for this problem includes:    Catapres 0.2 Mg Tabs (Clonidine hcl) .Marland Kitchen... Take 1 tablet by mouth three times a day    Atenolol 50 Mg Tabs (Atenolol) .Marland Kitchen... Take 1 tablet by mouth two times a day    Norvasc 10 Mg Tabs (Amlodipine besylate) .Marland Kitchen... Take 1 tablet by mouth daily.  Her updated medication list for this problem includes:    Catapres 0.2 Mg Tabs (Clonidine hcl) .Marland Kitchen... Take 1 tablet by mouth three times a day    Atenolol 50 Mg Tabs (Atenolol) .Marland Kitchen... Take 1 tablet by mouth two times a day    Norvasc 10 Mg Tabs (Amlodipine besylate) .Marland Kitchen... Take 1 tablet by mouth daily.  BP today: 132/76 Prior BP: 158/82 (01/21/2009)  Labs Reviewed: K+: 4.1 (12/02/2008) Creat: : 0.97 (12/02/2008)   Chol: 168  (05/05/2008)   HDL: 36 (05/05/2008)   LDL: 64 (05/05/2008)   TG: 342 (05/05/2008)  Problem # 3:  HYPERCHOLESTEROLEMIA (ICD-272.0) She has been taking medications. I will need to check fasting lipid profile next visit.  Her updated medication list for this problem includes:    Welchol 625 Mg Tabs (Colesevelam hcl) .Marland KitchenMarland KitchenMarland KitchenMarland Kitchen 3 tablets once daily    Lipitor 20 Mg Tabs (Atorvastatin calcium) .Marland Kitchen... Take 1 tablet by mouth daily.  Her updated medication list for this problem includes:    Welchol 625 Mg Tabs (Colesevelam hcl) .Marland KitchenMarland KitchenMarland KitchenMarland Kitchen 3 tablets once daily    Lipitor 20 Mg Tabs (Atorvastatin calcium) .Marland Kitchen... Take 1 tablet by mouth daily.  Problem # 4:  ANEMIA NOS (ICD-285.9) B12 deficiency iron deficiency. She had a colonoscopy recently that was normal. I will start oral B12. Her Hb increase to 10 from 8.  Her updated medication list for this problem includes:    Cvs Iron 325 (65 Fe) Mg Tabs (Ferrous sulfate) .Marland Kitchen... Take 1 tablet by mouth three times a day    Cobal-1000 1000 Mcg/ml Soln (Cyanocobalamin) ..... Inject im daily for 1 week then 46m every week for 4 weeks.    Vitamin B-12 250 Mcg Tabs (Cyanocobalamin) ..Marland Kitchen.. Take 1 tablet by mouth daily.  Orders: T-CBC w/Diff (([id number]  Complete Medication List: 1)  Plavix 75 Mg Tabs (Clopidogrel bisulfate) .... Take 1 tablet by mouth once a day 2)  Catapres 0.2 Mg Tabs (Clonidine hcl) .... Take 1 tablet by mouth three times a day 3)  Welchol 625 Mg Tabs (Colesevelam hcl) .... 3 tablets once daily 4)  Atenolol 50 Mg Tabs (Atenolol) .... Take 1 tablet by mouth two times a day 5)  Fexofenadine Hcl 180 Mg Tabs (Fexofenadine hcl) .... Take 1 tablet by mouth once a day as needed 6)  Hydroxyzine Hcl 50 Mg Tabs (Hydroxyzine hcl) .... Take 1 tablet by mouth once a day as needed 7)  Nitrostat 0.4 Mg Subl (Nitroglycerin) .... As needed 8)  Bayer Low Strength 81 Mg Tbec (Aspirin) .... Take 1 tablet by mouth once a day 9)  Norvasc 10 Mg Tabs (Amlodipine besylate) ....  Take 1 tablet by mouth daily. 10)  Cvs Iron 325 (65 Fe) Mg Tabs (Ferrous sulfate) .... Take 1 tablet by mouth three times a day 11)  Metformin Hcl 500 Mg Tb24 (Metformin hcl) .... Take 2  tablet  twice a day. 12)  1st Choice Lancets Thin Misc (Lancets) 13)  Lipitor 20 Mg Tabs (Atorvastatin calcium) .... Take 1 tablet by mouth daily. 14)  Glucotrol 10 Mg Tabs (Glipizide) .... Take 2 tablets in the morning and 1 tablet in the evening by mouth 15)  Truetrack Test Strp (Glucose blood) .... Use to test blood sugar twice daily 16)  Lancets Misc (Lancets) .... Use to test blood glucose twice daily 17)  Cobal-1000 1000 Mcg/ml Soln (Cyanocobalamin) .... Inject im daily for 1 week then 2m every week for 4 weeks. 18)  Vicodin 5-500 Mg Tabs (Hydrocodone-acetaminophen) .... Take 1 tablet by mouth two times a day as needed for pain 19)  Promethazine Hcl 25 Mg Tabs (Promethazine hcl) .... Take 1 tablet by mouth prn 20)  B-12 Monthly  21)  Lantus 100 Unit/ml Soln (Insulin glargine) .... Inject 12  units subq at bedtime. 22)  Insulin Syringe 28g X 1/2" 1 Ml Misc (Insulin syringe-needle u-100) .... Use to inject insulin as instructed. 23)  Vitamin B-12 250 Mcg Tabs (Cyanocobalamin) .... Take 1 tablet by mouth daily. 24)  Actos 15 Mg Tabs (Pioglitazone hcl) .... Take 1 tablet by mouth twice a day.  Patient Instructions: 1)  Please schedule appointment with DWyman Songsterfor insulin use/ diabetes education within 1 week. 2)  Please stop taking actos after you start using insulin. 3)  Please follow up in 2 week with [place].  4)  Please check blood sugar 3 times a day. Prescriptions: VITAMIN B-12 250 MCG TABS (CYANOCOBALAMIN) Take 1 tablet by mouth daily.  #30 x 3   Entered and Authorized by:   BNiel HummerMD   Signed by:   BNiel HummerMD on 04/11/2009   Method used:   Print then Give to Patient   RxID:   [id number] 100 UNIT/ML SOLN (INSULIN GLARGINE) Inject 12  units subq at bedtime.   #11 vials x 7   Entered and Authorized by:   BNiel HummerMD   Signed by:   BNiel HummerMD on 04/11/2009   Method used:   Print then Give to Patient   RxID::   [id number] SYRINGE 28G X 1/2" 1 ML MISC (INSULIN SYRINGE-NEEDLE U-100) Use to inject insulin as instructed.  #1 box x 6   Entered and Authorized by:   BNiel HummerMD   Signed by:   BNiel HummerMD on 04/11/2009   Method used:   Print then Give to Patient   RxID:   [id number]  Prevention & Chronic Care Immunizations   Influenza vaccine: Fluvax 3+  (01/06/2009)    Tetanus booster: Not documented    Pneumococcal vaccine: Not documented    H. zoster vaccine: Not documented  Colorectal Screening   Hemoccult: Not documented    Colonoscopy: 1) Prior right hemi-colectomy 2) Normal in the distal  ileum, biopsied. NORMAL 3) Normal colon, biopsied. NORMAL 4) Normal in the rectum  (06/29/2008)   Colonoscopy action/deferral: Repeat colonoscopy in 10 years.    (06/29/2008)   Colonoscopy due: 06/30/2018  Other Screening   Pap smear: Not documented    Mammogram: ASSESSMENT: Negative - BI-RADS 1^MM DIGITAL SCREENING  (11/16/2008)   Mammogram action/deferral: Ordered  (11/05/2008)   Mammogram due: 11/06/2008    DXA bone density scan: Not documented   DXA bone density action/deferral: Ordered  (11/05/2008)   DXA scan due: 11/06/2008    Smoking status: never  (04/11/2009)  Diabetes Mellitus   HgbA1C: 13.1  (  04/11/2009)   Hemoglobin A1C due: 02/05/2009    Eye exam: No diabetic retinopathy.   Vascular changes consistent with hypertension, venous tortuosity  (01/27/2008)   Eye exam due: 01/26/2009    Foot exam: yes  (09/21/2008)   Foot exam action/deferral: Do today   High risk foot: Not documented   Foot care education: Not documented   Foot exam due: 12/22/2008    Urine microalbumin/creatinine ratio: 74.5  (11/05/2007)  Lipids   Total Cholesterol: 168  (05/05/2008)   LDL: 64   (05/05/2008)   LDL Direct: Not documented   HDL: 36  (05/05/2008)   Triglycerides: 342  (05/05/2008)    SGOT (AST): 15  (05/05/2008)   SGPT (ALT): 15  (05/05/2008)   Alkaline phosphatase: 88  (05/05/2008)   Total bilirubin: 0.3  (05/05/2008)  Hypertension   Last Blood Pressure: 132 / 76  (04/11/2009)   Serum creatinine: 0.97  (12/02/2008)   Serum potassium 4.1  (12/02/2008)  Self-Management Support :    Patient will work on the following items until the next clinic visit to reach self-care goals:     Medications and monitoring: take my medicines every day, check my blood sugar, bring all of my medications to every visit, examine my feet every day  (04/11/2009)     Eating: drink diet soda or water instead of juice or soda, eat more vegetables, eat foods that are low in salt, eat baked foods instead of fried foods  (04/11/2009)     Activity: take a 30 minute walk every day  (04/11/2009)     Other: walks a lot at work  (11/05/2008)    Diabetes self-management support: Not documented   Last diabetes self-management training by diabetes educator: 11/05/2007    Hypertension self-management support: Not documented    Lipid self-management support: Not documented   Process Orders Check Orders Results:     [facility]: ABN not required for this insurance Tests Sent for requisitioning (April 13, 2009 9:40 AM):     04/11/2009: [facility] -- T-Basic Metabolic Panel [[id number]] (signed)     04/11/2009: [facility] -- T-Urine Microalbumin w/creat. ratio [[id number]] (signed)     04/11/2009: [facility] -- T-CBC w/Diff [[id number]] (signed)    Process Orders Check Orders Results:     [facility]: LDJ not required for this insurance Tests Sent for requisitioning (April 13, 2009 9:40 AM):     04/11/2009: [facility] -- T-Basic Metabolic Panel [[id number]] (signed)     04/11/2009:  [facility] -- T-Urine Microalbumin w/creat. ratio [[id number]] (signed)     04/11/2009: [facility] -- T-CBC w/Diff [[id number]] (signed)    Laboratory Results   Blood Tests   Date/Time Received: April 11, 2009 2:39 PM Date/Time Reported: Maryan Rued  April 11, 2009 2:39 PM   HGBA1C: 13.1%   (Normal Range: Non-Diabetic - 3-6%   Control Diabetic - 6-8%) CBG Random:: 490m/dL  Comments: results reported to GKellie Shropshireby [id number] Rued April 11, 2009 2:39 PM

## 2010-04-18 NOTE — Letter (Signed)
Summary: MODAL DAY REPORT /PLASMA  MODAL DAY REPORT /PLASMA   Imported By: Garlan Fillers 02/13/2010 10:45:12  _____________________________________________________________________  External Attachment:    Type:   Image     Comment:   External Document

## 2010-04-18 NOTE — Assessment & Plan Note (Signed)
Summary: 9MONTH F/U/EST/VS   Vital Signs:  Patient profile:   65 year old female Height:      64 inches Weight:      189.5 pounds BMI:     32.65 Temp:     98.2 degrees F oral Pulse rate:   76 / minute BP sitting:   115 / 65  (right arm)  Vitals Entered By: Silverio Decamp NT II (June 02, 2009 10:36 AM)   Diabetic Foot Exam Foot Inspection Is there a history of a foot ulcer?              No Is there a foot ulcer now?              No Can the patient see the bottom of their feet?          Yes Are the shoes appropriate in style and fit?          Yes Is there swelling or an abnormal foot shape?          No Are the toenails long?                No Are the toenails thick?                No Are the toenails ingrown?              No Is there heavy callous build-up?              No Is there pain in the calf muscle (Intermittent claudication) when walking?    NoIs there a claw toe deformity?              No Is there elevated skin temperature?            No Is there limited ankle dorsiflexion?            No Is there foot or ankle muscle weakness?            No  Diabetic Foot Care Education Patient educated on appropriate care of diabetic feet.     10-g (5.07) Semmes-Weinstein Monofilament Test           Right Foot          Left Foot Visual Inspection               Test Control      normal         normal Site 1         normal         normal Site 2         normal         normal Site 3         normal         normal Site 4         normal         normal Site 5         normal         normal Site 6         normal         normal Site 7         normal         normal Site 8         normal         normal Site 9         normal  normal Site 10         normal         normal  Impression      normal         normal Pain Assessment Patient in pain? no      Nutritional Status BMI of > 30 = obese  Have you ever been in a relationship where you felt threatened, hurt or afraid?No     Does patient need assistance? Functional Status Self care Ambulation Normal   Primary Care Provider:  Niel Hummer MD   History of Present Illness: 65 year old Past Medical History: Current Problems:  MI (ICD-410.90) HYPERTENSION (ICD-401.9) CORONARY ARTERY DISEASE, S/P PTCA, DES, ON PLAVIX (ICD-414.9) GERD (ICD-530.81) HYPERCHOLESTEROLEMIA (ZOX-096.0) METABOLIC ACIDOSIS (AVW-098.2) DM, UNCOMPLICATED, TYPE II (JXB-147.82) HYPERLIPIDEMIA (ICD-272.4) DIARRHEA, CHRONIC (ICD-787.91) PREVENTIVE HEALTH CARE (ICD-V70.0) FAMILY HSITORY BREAST CANCER 1ST DEGREE RELATIVE <50 (ICD-V16.3) OBESITY, MORBID (ICD-278.01) HIATAL HERNIA WITH REFLUX (ICD-553.3) CROHN'S DISEASE (ICD-555.9)  Elevated lipase/questionable pancreatitis.  Major depressive disorder.   She has been working on her diet, she has been using her insulin. She denies low blood sugar, no hypoglycemic symptoms. Her blood sugar has been from 130 to 180 in the morning.  She denies blood in the stool.  Current Medications (verified): 1)  Plavix 75 Mg Tabs (Clopidogrel Bisulfate) .... Take 1 Tablet By Mouth Once A Day 2)  Catapres 0.2 Mg Tabs (Clonidine Hcl) .... Take 1 Tablet By Mouth Three Times A Day 3)  Welchol 625 Mg Tabs (Colesevelam Hcl) .... 3 Tablets Once Daily 4)  Atenolol 50 Mg Tabs (Atenolol) .... Take 1 Tablet By Mouth Two Times A Day 5)  Fexofenadine Hcl 180 Mg Tabs (Fexofenadine Hcl) .... Take 1 Tablet By Mouth Once A Day As Needed 6)  Hydroxyzine Hcl 50 Mg Tabs (Hydroxyzine Hcl) .... Take 1 Tablet By Mouth Once A Day As Needed 7)  Nitrostat 0.4 Mg Subl (Nitroglycerin) .... As Needed 8)  Bayer Low Strength 81 Mg Tbec (Aspirin) .... Take 1 Tablet By Mouth Once A Day 9)  Norvasc 10 Mg  Tabs (Amlodipine Besylate) .... Take 1 Tablet By Mouth Daily. 10)  Cvs Iron 325 (65 Fe) Mg  Tabs (Ferrous Sulfate) .... Take 1 Tablet By Mouth Three Times A Day 11)  Metformin Hcl 500 Mg  Tb24 (Metformin Hcl) .... Take 2  Tablet  Twice A Day. 12)  1st Choice Lancets Thin   Misc (Lancets) 13)  Lipitor 20 Mg Tabs (Atorvastatin Calcium) .... Take 1 Tablet By Mouth Daily. 14)  Glucotrol 10 Mg Tabs (Glipizide) .... Take 2 Tablets in The Morning and 1 Tablet in The Evening By Mouth 15)  Truetrack Test   Strp (Glucose Blood) .... Use To Test Blood Sugar Twice Daily 16)  Lancets   Misc (Lancets) .... Use To Test Blood Glucose Twice Daily 17)  Cobal-1000 1000 Mcg/ml Soln (Cyanocobalamin) .... Inject Im Daily For 1 Week Then 82m Every Week For 4 Weeks. 18)  Vicodin 5-500 Mg Tabs (Hydrocodone-Acetaminophen) .... Take 1 Tablet By Mouth Two Times A Day As Needed For Pain 19)  Promethazine Hcl 25 Mg Tabs (Promethazine Hcl) .... Take 1 Tablet By Mouth Prn 20)  B-12 Monthly 21)  Lantus 100 Unit/ml Soln (Insulin Glargine) .... Inject 20 Units Subq At Bedtime. 22)  Insulin Syringe 28g X 1/2" 1 Ml Misc (Insulin Syringe-Needle U-100) .... Use To Inject Insulin As Instructed. 23)  Vitamin B-12 250 Mcg Tabs (Cyanocobalamin) .... Take 1 Tablet By Mouth Daily.  Allergies: 1)  ! Valium (Diazepam) 2)  ! Morphine Sulfate (Morphine Sulfate) 3)  ! Haldol 4)  ! Darvon 5)  ! Ativan 6)  ! Tramadol Hcl  Review of Systems  The patient denies fever, chest pain, syncope, dyspnea on exertion, peripheral edema, prolonged cough, hemoptysis, abdominal pain, melena, and hematochezia.    Physical Exam  General:  alert, well-developed, and well-nourished.   Head:  normocephalic, atraumatic, and no abnormalities observed.   Lungs:  normal respiratory effort, no intercostal retractions, no accessory muscle use, and normal breath sounds.   Heart:  normal rate and regular rhythm.   Abdomen:  soft, non-tender, normal bowel sounds, and no distention.   Extremities:  No edema.  Neurologic:  alert & oriented X3 and cranial nerves II-XII intact.    Diabetes Management Exam:    Foot Exam (with socks and/or shoes not present):       Sensory-Monofilament:           Left foot: normal          Right foot: normal   Impression & Recommendations:  Problem # 1:  DM, UNCOMPLICATED, TYPE II (KDT-267.12) Her HBA1c has decrease from 13 to 10. I will titrate lantus dose. She might need meals coverage.  The following medications were removed from the medication list:    Actos 15 Mg Tabs (Pioglitazone hcl) .Marland Kitchen... Take 1 tablet by mouth twice a day. Her updated medication list for this problem includes:    Bayer Low Strength 81 Mg Tbec (Aspirin) .Marland Kitchen... Take 1 tablet by mouth once a day    Metformin Hcl 500 Mg Tb24 (Metformin hcl) .Marland Kitchen... Take 2  tablet twice a day.    Glucotrol 10 Mg Tabs (Glipizide) .Marland Kitchen... Take 2 tablets in the morning and 2 tablet in the evening by mouth    Lantus 100 Unit/ml Soln (Insulin glargine) ..... Inject 23  units subq at bedtimeif in 3 days fasting blood sugar in themorning above 180 increase insulin to 25.  Orders: T- Capillary Blood Glucose (45809)  Problem # 2:  HYPERTENSION (ICD-401.9) Her blood pressure is well controlled. I will continue with current regimen.  Her updated medication list for this problem includes:    Catapres 0.2 Mg Tabs (Clonidine hcl) .Marland Kitchen... Take 1 tablet by mouth three times a day    Atenolol 50 Mg Tabs (Atenolol) .Marland Kitchen... Take 1 tablet by mouth two times a day    Norvasc 10 Mg Tabs (Amlodipine besylate) .Marland Kitchen... Take 1 tablet by mouth daily.  Orders: T-Hgb A1C (in-house) ([id number])  BP today: 115/65 Prior BP: 122/69 (04/28/2009)  Labs Reviewed: K+: 4.2 (04/11/2009) Creat: : 1.32 (04/11/2009)   Chol: 168 (05/05/2008)   HDL: 36 (05/05/2008)   LDL: 64 (05/05/2008)   TG: 342 (05/05/2008)  Problem # 3:  ANEMIA, B12 DEFICIENCY (ICD-281.1) I will continue with iron and B12 suplement. I will need to check CBC next visit.  Her updated medication list for this problem includes:    Cvs Iron 325 (65 Fe) Mg Tabs (Ferrous sulfate) .Marland Kitchen... Take 1 tablet by mouth three times a day    Cobal-1000 1000 Mcg/ml Soln  (Cyanocobalamin) ..... Inject im daily for 1 week then 42m every week for 4 weeks.    Vitamin B-12 250 Mcg Tabs (Cyanocobalamin) ..Marland Kitchen.. Take 1 tablet by mouth daily.  Orders: Vit B12 1000 mcg (J3420) Admin of Therapeutic Inj  intramuscular or subcutaneous (([id number]  Problem # 4:  HYPERLIPIDEMIA (IQBH-4194) Assessment: New I will check lipid profile.  Will adjust dose as needed.  Her updated medication list for this problem includes:    Welchol 625 Mg Tabs (Colesevelam hcl) .Marland KitchenMarland KitchenMarland KitchenMarland Kitchen 3 tablets once daily    Lipitor 20 Mg Tabs (Atorvastatin calcium) .Marland Kitchen... Take 1 tablet by mouth daily.  Orders: T-Lipid Profile ([id number])  Complete Medication List: 1)  Plavix 75 Mg Tabs (Clopidogrel bisulfate) .... Take 1 tablet by mouth once a day 2)  Catapres 0.2 Mg Tabs (Clonidine hcl) .... Take 1 tablet by mouth three times a day 3)  Welchol 625 Mg Tabs (Colesevelam hcl) .... 3 tablets once daily 4)  Atenolol 50 Mg Tabs (Atenolol) .... Take 1 tablet by mouth two times a day 5)  Fexofenadine Hcl 180 Mg Tabs (Fexofenadine hcl) .... Take 1 tablet by mouth once a day as needed 6)  Hydroxyzine Hcl 50 Mg Tabs (Hydroxyzine hcl) .... Take 1 tablet by mouth once a day as needed 7)  Nitrostat 0.4 Mg Subl (Nitroglycerin) .... As needed 8)  Bayer Low Strength 81 Mg Tbec (Aspirin) .... Take 1 tablet by mouth once a day 9)  Norvasc 10 Mg Tabs (Amlodipine besylate) .... Take 1 tablet by mouth daily. 10)  Cvs Iron 325 (65 Fe) Mg Tabs (Ferrous sulfate) .... Take 1 tablet by mouth three times a day 11)  Metformin Hcl 500 Mg Tb24 (Metformin hcl) .... Take 2  tablet twice a day. 12)  1st Choice Lancets Thin Misc (Lancets) 13)  Lipitor 20 Mg Tabs (Atorvastatin calcium) .... Take 1 tablet by mouth daily. 14)  Glucotrol 10 Mg Tabs (Glipizide) .... Take 2 tablets in the morning and 2 tablet in the evening by mouth 15)  Truetrack Test Strp (Glucose blood) .... Use to test blood sugar twice daily 16)  Lancets Misc (Lancets) ....  Use to test blood glucose twice daily 17)  Cobal-1000 1000 Mcg/ml Soln (Cyanocobalamin) .... Inject im daily for 1 week then 79m every week for 4 weeks. 18)  Vicodin 5-500 Mg Tabs (Hydrocodone-acetaminophen) .... Take 1 tablet by mouth two times a day as needed for pain 19)  Promethazine Hcl 25 Mg Tabs (Promethazine hcl) .... Take 1 tablet by mouth prn 20)  B-12 Monthly  21)  Lantus 100 Unit/ml Soln (Insulin glargine) .... Inject 23  units subq at bedtimeif in 3 days fasting blood sugar in themorning above 180 increase insulin to 25. 22)  Insulin Syringe 28g X 1/2" 1 Ml Misc (Insulin syringe-needle u-100) .... Use to inject insulin as instructed. 23)  Vitamin B-12 250 Mcg Tabs (Cyanocobalamin) .... Take 1 tablet by mouth daily.  Other Orders: Pneumococcal Vaccine (([id number] Admin 1st Vaccine ([phone]  Patient Instructions: 1)  Please schedule a follow-up appointment in 2 months. 2)  Please check blood sugar in the morning before breakfast, and 2 hour after each meal.  Prevention & Chronic Care Immunizations   Influenza vaccine: Fluvax 3+  (01/06/2009)   Influenza vaccine due: 11/18/2010    Tetanus booster: 04/28/2009: Td    Pneumococcal vaccine: Pneumovax  (06/02/2009)    H. zoster vaccine: Not documented   H. zoster vaccine deferral: Deferred  (04/28/2009)  Colorectal Screening   Hemoccult: Not documented   Hemoccult action/deferral: Deferred  (04/28/2009)    Colonoscopy: 1) Prior right hemi-colectomy 2) Normal in the distal  ileum, biopsied. NORMAL 3) Normal colon, biopsied. NORMAL 4) Normal in the rectum  (06/29/2008)   Colonoscopy action/deferral: Repeat colonoscopy in 10 years.    (06/29/2008)   Colonoscopy due: 06/30/2018  Other Screening  Pap smear: Not documented   Pap smear action/deferral: Deferred  (04/28/2009)    Mammogram: ASSESSMENT: Negative - BI-RADS 1^MM DIGITAL SCREENING  (11/16/2008)   Mammogram action/deferral: Ordered  (11/05/2008)   Mammogram due:  11/06/2008    DXA bone density scan: Not documented   DXA bone density action/deferral: Ordered  (11/05/2008)   DXA scan due: 11/06/2008    Smoking status: never  (04/28/2009)  Diabetes Mellitus   HgbA1C: 10.0  (06/02/2009)   Hemoglobin A1C due: 02/05/2009    Eye exam: No diabetic retinopathy.   Vascular changes consistent with hypertension, venous tortuosity  (01/27/2008)   Diabetic eye exam action/deferral: Ophthalmology referral  (04/28/2009)   Eye exam due: 01/26/2009    Foot exam: yes  (06/02/2009)   Foot exam action/deferral: Do today   High risk foot: Not documented   Foot care education: Done  (06/02/2009)   Foot exam due: 12/22/2008    Urine microalbumin/creatinine ratio: 52.4  (04/11/2009)    Diabetes flowsheet reviewed?: Yes   Progress toward A1C goal: Improved  Lipids   Total Cholesterol: 168  (05/05/2008)   LDL: 64  (05/05/2008)   LDL Direct: Not documented   HDL: 36  (05/05/2008)   Triglycerides: 342  (05/05/2008)    SGOT (AST): 15  (05/05/2008)   BMP action: Ordered   SGPT (ALT): 15  (05/05/2008)   Alkaline phosphatase: 88  (05/05/2008)   Total bilirubin: 0.3  (05/05/2008)    Lipid flowsheet reviewed?: Yes   Progress toward LDL goal: Improved  Hypertension   Last Blood Pressure: 115 / 65  (06/02/2009)   Serum creatinine: 1.32  (04/11/2009)   Serum potassium 4.2  (04/11/2009)    Hypertension flowsheet reviewed?: Yes   Progress toward BP goal: At goal  Self-Management Support :   Personal Goals (by the next clinic visit) :     Personal A1C goal: 7  (04/28/2009)     Personal blood pressure goal: 130/80  (04/28/2009)     Personal LDL goal: 100  (04/28/2009)    Patient will work on the following items until the next clinic visit to reach self-care goals:     Medications and monitoring: take my medicines every day, check my blood sugar, bring all of my medications to every visit  (06/02/2009)     Eating: drink diet soda or water instead of juice  or soda, eat more vegetables, use fresh or frozen vegetables, eat foods that are low in salt, eat baked foods instead of fried foods, eat fruit for snacks and desserts  (06/02/2009)     Activity: take a 30 minute walk every day  (06/02/2009)     Other: walks a lot at work  (11/05/2008)    Diabetes self-management support: Education handout, Written self-care plan  (06/02/2009)   Diabetes care plan printed   Diabetes education handout printed   Last diabetes self-management training by diabetes educator: 11/05/2007    Hypertension self-management support: Education handout, Written self-care plan  (06/02/2009)   Hypertension self-care plan printed.   Hypertension education handout printed    Lipid self-management support: Education handout, Written self-care plan  (06/02/2009)   Lipid self-care plan printed.   Lipid education handout printed   Nursing Instructions: Give Pneumovax today Diabetic foot exam today    Process Orders Check Orders Results:     [facility]: NUU not required for this insurance Tests Sent for requisitioning (June 03, 2009 9:31 AM):     06/02/2009: [facility] -- T-Lipid Profile [  [phone] (signed)    Process Orders Check Orders Results:     [facility]: ABN not required for this insurance Tests Sent for requisitioning (June 03, 2009 9:31 AM):     06/02/2009: [facility] -- T-Lipid Profile [phone] (signed)   Laboratory Results   Blood Tests   Date/Time Received: June 02, 2009 11:00 AM  Date/Time Reported: Lenoria Farrier  June 02, 2009 11:00 AM   HGBA1C: 10.0%   (Normal Range: Non-Diabetic - 3-6%   Control Diabetic - 6-8%) CBG Fasting:: 181m/dL      Medication Administration  Injection # 1:    Medication: Vit B12 1000 mcg    Diagnosis: ANEMIA, B12 DEFICIENCY (ICD-281.1)    Route: IM    Site: L deltoid    Exp Date: 02/2011    Lot #: [id number]   Mfr: [place]   Patient tolerated injection without complications    Given by: KNadine Counts(Deborra Medina (June 02, 2009 11:28 AM)  Orders Added: 1)  T-Hgb A1C (in-house) [83036QW] 2)  T-Lipid Profile [[id number]] 3)  T- Capillary Blood Glucose [82948] 4)  Vit B12 1000 mcg [J3420] 5)  Admin of Therapeutic Inj  intramuscular or subcutaneous [96372] 6)  Pneumococcal Vaccine [90732] 7)  Admin 1st Vaccine [90471] 8)  Est. Patient Level IV [[28366]   Immunizations Administered:  Pneumonia Vaccine:    Vaccine Type: Pneumovax    Site: right deltoid    Mfr: Merck    Dose: 0.5 ml    Route: IM    Given by: KNadine Counts([place]    Exp. Date: 10/31/2010    Lot #: [id number]   VIS given: 10/15/95 version given June 02, 2009.  Appended Document: 75MONTH F/U/EST/VS    Clinical Lists Changes  Observations: Added new observation of PAST MED HX: CAD      1996 - PTCA and angioplasty diagonal branch      2000 - Rotoblator & angioplasty of diagonal      2006 - subendocardial AMI, DES to poximal LAD. Also had 90% stenosis in distal apical LAD. EF 55%.                    Apical hypokinesis      Indefinite ASA and Plavix  HTN      2006 B renal arteries patient      2003 MRA - no RAS      2003 - pheo W/U Dr FHassell Donereportedly negative  Chronic N / V / D      Presumptive dx Crohn's dx per elevated P ANCA      Failed Entocort and Pentasa      Sep 2003 - Ileocolectomy c anastomosis Dr BDeon Pilling2/2 adhesions - path was NEGATIVE for Crohns       EGD, small-bowel follow-through (11/03), and an enteroclysis (10/03)  were unrevealing.            Causes hypo Mag and hypocalcemia  Anemia - multifactorial      B12 def - 150 in 3/10      Fe def - ferritin 35 3/10      Both are being repleted  CVA      Incidental finding MRI 2002 L lacunar infarct  DM II insulin dependent  Hyperlipidemia  Seasonal allergies  Chronic pain      CT 10/10 Spinal stenosis L2 - S1  CRI  Adnexal mass 8/03      s/p  Laparoscopic  BSO (R ovarian fibroma) & lysis of adhesion   (10/23/2009 13:41)       Past History:  Past Medical History: CAD      1996 - PTCA and angioplasty diagonal branch      2000 - Rotoblator & angioplasty of diagonal      2006 - subendocardial AMI, DES to poximal LAD. Also had 90% stenosis in distal apical LAD. EF 55%.                    Apical hypokinesis      Indefinite ASA and Plavix  HTN      2006 B renal arteries patient      2003 MRA - no RAS      2003 - pheo W/U Dr Hassell Done reportedly negative  Chronic N / V / D      Presumptive dx Crohn's dx per elevated P ANCA      Failed Entocort and Pentasa      Sep 2003 - Ileocolectomy c anastomosis Dr Deon Pilling 2/2 adhesions - path was NEGATIVE for Crohns       EGD, small-bowel follow-through (11/03), and an enteroclysis (10/03)  were unrevealing.            Causes hypo Mag and hypocalcemia  Anemia - multifactorial      B12 def - 150 in 3/10      Fe def - ferritin 35 3/10      Both are being repleted  CVA      Incidental finding MRI 2002 L lacunar infarct  DM II insulin dependent  Hyperlipidemia  Seasonal allergies  Chronic pain      CT 10/10 Spinal stenosis L2 - S1  CRI  Adnexal mass 8/03      s/p Laparoscopic BSO (R ovarian fibroma) & lysis of adhesion

## 2010-04-18 NOTE — Assessment & Plan Note (Signed)
Summary: DM TRAINING/VS   Allergies: 1)  ! Valium (Diazepam) 2)  ! Morphine Sulfate (Morphine Sulfate) 3)  ! Haldol 4)  ! Darvon 5)  ! Ativan 6)  ! Tramadol Hcl   Complete Medication List: 1)  Plavix 75 Mg Tabs (Clopidogrel bisulfate) .... Take 1 tablet by mouth once a day 2)  Catapres 0.2 Mg Tabs (Clonidine hcl) .... Take 1 tablet by mouth three times a day 3)  Welchol 625 Mg Tabs (Colesevelam hcl) .... 3 tablets once daily 4)  Atenolol 50 Mg Tabs (Atenolol) .... Take 1 tablet by mouth two times a day 5)  Fexofenadine Hcl 180 Mg Tabs (Fexofenadine hcl) .... Take 1 tablet by mouth once a day as needed 6)  Hydroxyzine Hcl 50 Mg Tabs (Hydroxyzine hcl) .... Take 1 tablet by mouth once a day as needed 7)  Nitrostat 0.4 Mg Subl (Nitroglycerin) .... As needed 8)  Bayer Low Strength 81 Mg Tbec (Aspirin) .... Take 1 tablet by mouth once a day 9)  Norvasc 10 Mg Tabs (Amlodipine besylate) .... Take 1 tablet by mouth daily. 10)  Cvs Iron 325 (65 Fe) Mg Tabs (Ferrous sulfate) .... Take 1 tablet by mouth three times a day 11)  Metformin Hcl 500 Mg Tb24 (Metformin hcl) .... Take 2  tablet twice a day. 12)  1st Choice Lancets Thin Misc (Lancets) 13)  Lipitor 20 Mg Tabs (Atorvastatin calcium) .... Take 1 tablet by mouth daily. 14)  Glucotrol 10 Mg Tabs (Glipizide) .... Take 2 tablets in the morning and 1 tablet in the evening by mouth 15)  Truetrack Test Strp (Glucose blood) .... Use to test blood sugar twice daily 16)  Lancets Misc (Lancets) .... Use to test blood glucose twice daily 17)  Cobal-1000 1000 Mcg/ml Soln (Cyanocobalamin) .... Inject im daily for 1 week then 12m every week for 4 weeks. 18)  Vicodin 5-500 Mg Tabs (Hydrocodone-acetaminophen) .... Take 1 tablet by mouth two times a day as needed for pain 19)  Promethazine Hcl 25 Mg Tabs (Promethazine hcl) .... Take 1 tablet by mouth prn 20)  B-12 Monthly  21)  Lantus 100 Unit/ml Soln (Insulin glargine) .... Inject 12  units subq at  bedtime. 22)  Insulin Syringe 28g X 1/2" 1 Ml Misc (Insulin syringe-needle u-100) .... Use to inject insulin as instructed. 23)  Vitamin B-12 250 Mcg Tabs (Cyanocobalamin) .... Take 1 tablet by mouth daily. 24)  Actos 15 Mg Tabs (Pioglitazone hcl) .... Take 1 tablet by mouth twice a day.  Other Orders: DSMT(Medicare) Individual, 375Minutes (G0108)  Diabetes Self Management Training  PCP: BNiel HummerMD Date diagnosed with diabetes: 03/19/1997 Diabetes Type: Type 2 non-insulin treated Other persons present: no Current smoking Status: never  Diabetes Medications:  Lipid lowering Meds? No Anti-platelet Meds? Yes Comments: here for insulin instruction    Monitoring Self monitoring blood glucose Never Name of Meter  True Track-provided today Measures urine ketones? No  Recent Episodes of: Requiring Help from another person  Hyperglycemia : Yes Hypoglycemia: No Severe Hypoglycemia : No   Wears Medical I.D. No Carrys Food for Low Blood sugar Yes Can you tell if your blood sugar is low? Yes   Diabetes Disease Process  Discussed today Define diabetes in simple terms: Needs review/assistance   State own type of diabetes: Demonstrates competency Medications State name-action-dose-duration-side effects-and time to take medication: Demonstrates competency   State appropriate timing of food related to medication: Demonstrates competency   Demonstrates/verbalizes site selection and rotation for  injections Demonstrates competency   Correctly draw up and administer insulin-[place]-Symlin-glucagon: Demonstrates competency   Describe safe needle/lancet disposal: Nature conservation officer purpose and frequency of monitoring BG-ketones-HgbA1C  : Insurance risk surveyor target blood glucose and HgbA1C goals: Demonstrates competency    Complications State the causes- signs and symptoms and prevention of hypoglycemia:  Demonstrates competency   Explain proper treatment of hypoglycemia: Demonstrates competency    Exercise    BEHAVIORAL GOALS INITIAL Utilizing medications if for therapeutic effectiveness: inject 12 units lantus tonight and every night thereafter Monitoring blood glucose levels daily: check blood sugar every morning before eating or drinking, bring meter to appointments        Diabetes Self Management Support-will address at next visit follow-up- 1  week by phone, 1 month for contnmued DSMT as patient desires

## 2010-04-18 NOTE — Progress Notes (Signed)
  Phone Note Outgoing Call   Call placed by: [name] NT II,  May 06, 2009 12:10 PM Call placed to: Patient Details for Reason: [place] Summary of Call: SPOKE WITH [name] AND [name] [phone] @ 10:45AM. ALSO A REMINDER LETTER WITH APPT INFO MAILED TO THE PATIENT.[name] NT II  May 06, 2009 12:11 PM

## 2010-04-18 NOTE — Assessment & Plan Note (Signed)
  Prescriptions: METFORMIN HCL 1000 MG TABS (METFORMIN HCL) Take 1 tablet by mouth twice a day.  #60 x 5   Entered and Authorized by:   Niel Hummer MD   Signed by:   Niel Hummer MD on 06/27/2009   Method used:   Telephoned to ...       [facility] (retail)       [address] [place],   [address]       Ph: [phone]       Fax: [phone]   RxID:   [id number]  I change metformin to 1042m twice a day.   Appended Document:  rx faxed in

## 2010-04-18 NOTE — Miscellaneous (Signed)
Summary: Orders Update  Clinical Lists Changes  Orders: Added new Test order of T-Basic Metabolic Panel ([id number]) - Signed  

## 2010-04-18 NOTE — Progress Notes (Signed)
Summary: refill/ hla  Phone Note Refill Request Message from:  Fax from Pharmacy on December 09, 2009 4:50 PM  Refills Requested: Medication #1:  ATENOLOL 50 MG TABS Take 1 tablet by mouth two times a day   Dosage confirmed as above?Dosage Confirmed   Last Refilled: 8/17 Initial call taken by: Freddy Finner RN,  December 09, 2009 4:51 PM  Follow-up for Phone Call        Has Nov appt Follow-up by: Larey Dresser MD,  December 09, 2009 4:59 PM    Prescriptions: ATENOLOL 50 MG TABS (ATENOLOL) Take 1 tablet by mouth two times a day  #60 x 11   Entered and Authorized by:   Larey Dresser MD   Signed by:   Larey Dresser MD on 12/09/2009   Method used:   Faxed to ...       [facility] Department (retail)       [address] [place], [place]  [address]       Ph: [phone]       Fax: [phone]   RxID:   [id number]

## 2010-04-18 NOTE — Letter (Signed)
Summary: Chiropodist   Imported By: Bonner Puna 04/29/2009 13:43:32  _____________________________________________________________________  External Attachment:    Type:   Image     Comment:   External Document

## 2010-04-18 NOTE — Progress Notes (Signed)
Summary: med refill/gp  Phone Note Refill Request Message from:  Fax from Pharmacy on May 06, 2009 4:21 PM  Refills Requested: Medication #1:  NORVASC 10 MG  TABS take 1 tablet by mouth daily.   Last Refilled: 02/21/2009  Method Requested: Fax to [place] Initial call taken by: Morrison Old RN,  May 06, 2009 4:21 PM  Follow-up for Phone Call       Follow-up by: Niel Hummer MD,  May 06, 2009 7:29 PM    Prescriptions: NORVASC 10 MG  TABS (AMLODIPINE BESYLATE) take 1 tablet by mouth daily.  #30 x 7   Entered and Authorized by:   Niel Hummer MD   Signed by:   Niel Hummer MD on 05/06/2009   Method used:   Telephoned to ...       [facility] Department (retail)       [address] [place], [place]  [address]       Ph: [phone]       Fax: [phone]   RxID:   [id number]   Appended Document: med refill/gp Above Rx refill request faxed to [place].

## 2010-04-18 NOTE — Progress Notes (Signed)
Summary: Refill/gh  Phone Note Refill Request Message from:  Fax from Pharmacy on March 22, 2009 10:34 AM  Refills Requested: Medication #1:  FEXOFENADINE HCL 180 MG TABS Take 1 tablet by mouth once a day as needed   Last Refilled: 12/27/2008  Method Requested: Fax to [place] Initial call taken by: Sander Nephew RN,  March 22, 2009 10:35 AM    Prescriptions: FEXOFENADINE HCL 180 MG TABS (FEXOFENADINE HCL) Take 1 tablet by mouth once a day as needed  #31 x 6   Entered and Authorized by:   Niel Hummer MD   Signed by:   Niel Hummer MD on 03/23/2009   Method used:   Telephoned to ...       [facility] Department (retail)       [address] [place], [place]  [address]       Ph: [phone]       Fax: [phone]   RxID:   [id number]

## 2010-04-18 NOTE — Progress Notes (Signed)
  Phone Note Outgoing Call   Call placed by: Larey Dresser MD,  October 26, 2009 2:08 PM Call placed to: Patient Summary of Call: Left message. A1C good down from 10 to 7.4. Also needed to ask about ACEi as she has diabetic nephropathy and I need to start ACEI. Had been on prior and apparently no reaction. Will try back again.  Follow-up for Phone Call        Was able to talk to pt. She will D/C Clonidine and start lisinopril at 10 once daily.  Please ask pt to come to clinic for acute visit in 2 weeks - for BP check and BMP. thanks Follow-up by: Larey Dresser MD,  October 26, 2009 5:02 PM  Additional Follow-up for Phone Call Additional follow up Details #1::        [facility] can provide Ms. Berrian with quinipril 10 mg by mouth once daily for free.  Thus this prescription was written instead of lisinopril.  She still needs the BMP and follow-up appointment as outlined by Dr. Lynnae January.    New/Updated Medications: LISINOPRIL 10 MG TABS (LISINOPRIL) One by mouth once daily for high blood pressure and to protect your kidneys Prescriptions: LISINOPRIL 10 MG TABS (LISINOPRIL) One by mouth once daily for high blood pressure and to protect your kidneys  #31 x 1   Entered and Authorized by:   Larey Dresser MD   Signed by:   Larey Dresser MD on 10/26/2009   Method used:   Faxed to ...       [facility] Department (retail)       [address] [place], [place]  [address]       Ph: [phone]       Fax: [phone]   RxID:   [id number]

## 2010-04-18 NOTE — Progress Notes (Signed)
Summary: refill/gg  Phone Note Refill Request  on June 27, 2009 12:18 PM  Refills Requested: Medication #1:  METFORMIN HCL 500 MG  TB24 Take 2  tablet twice a day. Are these directions correct at 2 tabs two times a day?   Method Requested: Fax to [place] Initial call taken by: Gevena Cotton RN,  June 27, 2009 12:18 PM    .I will stop 24 hr metformin and change to 1054m twice a day. Please see next refillPrescriptions: METFORMIN HCL 500 MG  TB24 (METFORMIN HCL) Take 2  tablet twice a day.  #120 x 4   Entered and Authorized by:   BNiel HummerMD   Signed by:   BNiel HummerMD on 06/27/2009   Method used:   [facility]       [address]  [address]      Ph: [phone]      Fax: [phone]  RxID:   [id number]

## 2010-04-18 NOTE — Progress Notes (Signed)
Summary: Refill/gh  Phone Note Refill Request Message from:  Patient on April 18, 2009 11:30 AM  Refills Requested: Medication #1:  LANTUS 100 UNIT/ML SOLN Inject 12  units subq at bedtime. Call from pr unable toget Lantus Insulin. [facility] will order and will take time to get.  To costly at [facility] for pt to get.   Method Requested: Electronic Initial call taken by: Sander Nephew RN,  April 18, 2009 11:31 AM    Prescriptions: LANTUS 100 UNIT/ML SOLN (INSULIN GLARGINE) Inject 12  units subq at bedtime.  #1 vial x 0   Entered and Authorized by:   Niel Hummer MD   Signed by:   Niel Hummer MD on 04/18/2009   Method used:   Samples Given   RxID:   [id number]

## 2010-04-18 NOTE — Progress Notes (Signed)
Summary: med refill/gp  Phone Note Refill Request Message from:  Fax from Pharmacy on September 12, 2009 12:19 PM  Refills Requested: Medication #1:  WELCHOL 625 MG TABS 3 tablets once daily   Last Refilled: 05/10/2009 Labs June 13 and last appt. Aug 16, 2009.   Method Requested: Telephone to Pharmacy Initial call taken by: Morrison Old RN,  September 12, 2009 12:19 PM  Follow-up for Phone Call        Needs appt with me.  Sent flag to Ms Cyndi Bender to make Aug appt. Follow-up by: Larey Dresser MD,  September 12, 2009 2:14 PM    Prescriptions: WELCHOL 625 MG TABS (COLESEVELAM HCL) 3 tablets once daily  #90 x 6   Entered and Authorized by:   Larey Dresser MD   Signed by:   Larey Dresser MD on 09/12/2009   Method used:   Faxed to ...       [facility] Department (retail)       [address] [place], [place]  [address]       Ph: [phone]       Fax: [phone]   RxID:   [id number]

## 2010-04-18 NOTE — Assessment & Plan Note (Signed)
Summary: EST-CK/FU/MEDS/CFB   Vital Signs:  Patient profile:   65 year old female Height:      64 inches (162.56 cm) Weight:      184.4 pounds (83.82 kg) BMI:     31.77 Temp:     97.5 degrees F (36.39 degrees C) oral Pulse rate:   69 / minute BP sitting:   104 / 62  (left arm) Cuff size:   large  Vitals Entered By: Lucky Rathke NT II (October 25, 2009 8:46 AM) CC: ROUTINE OFFICE VISIT WITH NO CONCERNS NOR COMPLAINTS, Hypertension Management, Lipid Management Is Patient Diabetic? Yes Did you bring your meter with you today? Yes Pain Assessment Patient in pain? no      Nutritional Status BMI of > 30 = obese  Have you ever been in a relationship where you felt threatened, hurt or afraid?No   Does patient need assistance? Functional Status Self care Ambulation Normal   Primary Care Provider:  Niel Hummer MD  CC:  ROUTINE OFFICE VISIT WITH NO CONCERNS NOR COMPLAINTS, Hypertension Management, and Lipid Management.  History of Present Illness: [name] is here for a F/U. She has no complaints. This is my first time seeing her.  Hypertension History:      Positive major cardiovascular risk factors include female age 67 years old or older, diabetes, hyperlipidemia, and hypertension.  Negative major cardiovascular risk factors include non-tobacco-user status.        Positive history for target organ damage include ASHD (either angina/prior MI/prior CABG) and prior stroke (or TIA).    Lipid Management History:      Positive NCEP/ATP III risk factors include female age 76 years old or older, diabetes, HDL cholesterol less than 40, hypertension, ASHD (either angina/prior MI/prior CABG), and prior stroke (or TIA).  Negative NCEP/ATP III risk factors include non-tobacco-user status.    Preventive Screening-Counseling & Management  Alcohol-Tobacco     Alcohol drinks/day: 0     Smoking Status: never  Caffeine-Diet-Exercise     Does Patient Exercise: yes     Type of  exercise: GYM     Exercise (avg: min/session): 1 1/2 HOUR     Times/week: 3  Current Medications (verified): 1)  Plavix 75 Mg Tabs (Clopidogrel Bisulfate) .... Take 1 Tablet By Mouth Once A Day 2)  Catapres 0.2 Mg Tabs (Clonidine Hcl) .... Take 1 Tablet By Mouth Three Times A Day 3)  Welchol 625 Mg Tabs (Colesevelam Hcl) .... 3 Tablets Once Daily 4)  Atenolol 50 Mg Tabs (Atenolol) .... Take 1 Tablet By Mouth Two Times A Day 5)  Hydroxyzine Hcl 50 Mg Tabs (Hydroxyzine Hcl) .... Take 1 Tablet By Mouth Once A Day As Needed 6)  Nitrostat 0.4 Mg Subl (Nitroglycerin) .... As Needed 7)  Bayer Low Strength 81 Mg Tbec (Aspirin) .... Take 1 Tablet By Mouth Once A Day 8)  Norvasc 10 Mg  Tabs (Amlodipine Besylate) .... Take 1 Tablet By Mouth Daily. 9)  Cvs Iron 325 (65 Fe) Mg  Tabs (Ferrous Sulfate) .... Take 1 Tablet By Mouth Three Times A Day 10)  Metformin Hcl 1000 Mg Tabs (Metformin Hcl) .... Take 1 Tablet By Mouth Twice A Day. 11)  1st Choice Lancets Thin   Misc (Lancets) 12)  Lipitor 20 Mg Tabs (Atorvastatin Calcium) .... Take 1 Tablet By Mouth Daily. 13)  Glucotrol 10 Mg Tabs (Glipizide) .... Take 2 Tablets in The Morning and 2 Tablet in The Evening By Mouth 14)  Truetrack Test  Strp (Glucose Blood) .... Use To Test Blood Sugar Twice Daily 15)  Lancets   Misc (Lancets) .... Use To Test Blood Glucose Twice Daily 16)  Cobal-1000 1000 Mcg/ml Soln (Cyanocobalamin) .... Inject Im Daily For 1 Week Then 58m Every Week For 4 Weeks. 17)  B-12 Monthly 18)  Lantus 100 Unit/ml Soln (Insulin Glargine) .... Inject 30  Units Subq At Bedtime 19)  Insulin Syringe 28g X 1/2" 1 Ml Misc (Insulin Syringe-Needle U-100) .... Use To Inject Insulin As Instructed.  Allergies: 1)  ! Valium (Diazepam) 2)  ! Morphine Sulfate (Morphine Sulfate) 3)  ! Haldol 4)  ! Darvon 5)  ! Ativan 6)  ! Tramadol Hcl  Past History:  Past Medical History: Last updated: 10/23/2009 CAD      1996 - PTCA and angioplasty diagonal  branch      2000 - Rotoblator & angioplasty of diagonal      2006 - subendocardial AMI, DES to poximal LAD. Also had 90% stenosis in distal apical LAD. EF 55%.                    Apical hypokinesis      Indefinite ASA and Plavix  HTN      2006 B renal arteries patient      2003 MRA - no RAS      2003 - pheo W/U Dr FHassell Donereportedly negative  Chronic N / V / D      Presumptive dx Crohn's dx per elevated P ANCA      Failed Entocort and Pentasa      Sep 2003 - Ileocolectomy c anastomosis Dr BDeon Pilling2/2 adhesions - path was NEGATIVE for Crohns       EGD, small-bowel follow-through (11/03), and an enteroclysis (10/03)  were unrevealing.            Causes hypo Mag and hypocalcemia  Anemia - multifactorial      B12 def - 150 in 3/10      Fe def - ferritin 35 3/10      Both are being repleted  CVA      Incidental finding MRI 2002 L lacunar infarct  DM II insulin dependent  Hyperlipidemia  Seasonal allergies  Chronic pain      CT 10/10 Spinal stenosis L2 - S1  CRI  Adnexal mass 8/03      s/p Laparoscopic BSO (R ovarian fibroma) & lysis of adhesion   Family History: Last updated: 11/05/2008 Family History Breast cancer 1st degree relative <50 Family History of CAD Female 1st degree relative <60 Family History Diabetes 1st degree relative - MGM, Aunt Brother died of pancreatic cancer at age 545 Review of Systems General:  Complains of fatigue; denies fever, sleep disorder, and sweats. Eyes:  Denies blurring, double vision, and eye pain. ENT:  Denies nasal congestion and sinus pressure. CV:  Denies chest pain or discomfort, shortness of breath with exertion, and swelling of feet. Resp:  Denies morning headaches and shortness of breath. GI:  Denies abdominal pain, constipation, diarrhea, nausea, and vomiting.  Physical Exam  General:  alert, well-developed, and well-nourished.   Head:  normocephalic, atraumatic, and no abnormalities observed.   Eyes:  vision grossly intact  and no injection.   Ears:  R ear normal and L ear normal.   Nose:  no external deformity and no external erythema.   Neck:  supple, full ROM, no masses, no thyromegaly, and no carotid bruits.   Heart:  normal rate, regular rhythm, no murmur, and no gallop.   Extremities:  trace left pedal edema and trace right pedal edema.   Skin:  turgor normal and color normal.   Cervical Nodes:  no anterior cervical adenopathy.   Psych:  Oriented X3, memory intact for recent and remote, normally interactive, good eye contact, not anxious appearing, and not depressed appearing.     Impression & Recommendations:  Problem # 1:  CAD (ICD-414.00) She sees Dr Amanda Cockayne. She is asymptomatic currently. She is on life long Plavix and ASA I believe due to the proximal nature of her stenosis and stent.  Her updated medication list for this problem includes:    Plavix 75 Mg Tabs (Clopidogrel bisulfate) .Marland Kitchen... Take 1 tablet by mouth once a day    Catapres 0.2 Mg Tabs (Clonidine hcl) .Marland Kitchen... Take 1 tablet by mouth three times a day    Atenolol 50 Mg Tabs (Atenolol) .Marland Kitchen... Take 1 tablet by mouth two times a day    Nitrostat 0.4 Mg Subl (Nitroglycerin) .Marland Kitchen... As needed    Bayer Low Strength 81 Mg Tbec (Aspirin) .Marland Kitchen... Take 1 tablet by mouth once a day    Norvasc 10 Mg Tabs (Amlodipine besylate) .Marland Kitchen... Take 1 tablet by mouth daily.  Problem # 2:  HYPERTENSION (ICD-401.9) Her BP was very well controlled. Cont current regimen.  Her updated medication list for this problem includes:    Catapres 0.2 Mg Tabs (Clonidine hcl) .Marland Kitchen... Take 1 tablet by mouth three times a day    Atenolol 50 Mg Tabs (Atenolol) .Marland Kitchen... Take 1 tablet by mouth two times a day    Norvasc 10 Mg Tabs (Amlodipine besylate) .Marland Kitchen... Take 1 tablet by mouth daily.  Problem # 3:  SXS complex of N/V/D This seems to be 2/2 her extensive G/I hx and sugeries. This has been chronic but intermittent. When it occurs, it lasts a day or two. She takes immodium. Used to take  anti-emtic as needed but doesn't have it anymore. She does see [facility] about once yearly. She has not had to be hospitalized recently for this.  Problem # 4:  ANEMIA, B12 DEFICIENCY (ICD-281.1) Her anemia is actually multifactorial, both B12 and Fe def and maybe chronic disease. I told her that since she doesn't have an ilium anymore that oral B 12 is likely a waste of her money. She will cont with the monthly IM injections only. Her last B 12 level in 9/10 was in the 500's. Her ferritin was 52 in 8/10.  Next blood draw - recheck ferritin.  The following medications were removed from the medication list:    Vitamin B-12 250 Mcg Tabs (Cyanocobalamin) .Marland Kitchen... Take 1 tablet by mouth daily. Her updated medication list for this problem includes:    Cvs Iron 325 (65 Fe) Mg Tabs (Ferrous sulfate) .Marland Kitchen... Take 1 tablet by mouth three times a day    Cobal-1000 1000 Mcg/ml Soln (Cyanocobalamin) ..... Inject im daily for 1 week then 19m every week for 4 weeks.  Orders: Vit B12 1000 mcg (J3420) Admin of Therapeutic Inj  intramuscular or subcutaneous (([id number]  Problem # 5:  CVA (ICD-434.91) She has no residual sxs even though an infarct was seen on MRI. She cont on ASA and plavix.  Her updated medication list for this problem includes:    Plavix 75 Mg Tabs (Clopidogrel bisulfate) ..Marland Kitchen.. Take 1 tablet by mouth once a day    Bayer Low Strength 81 Mg Tbec (Aspirin) ..Marland Kitchen.. Take 1  tablet by mouth once a day  Problem # 6:  DM, UNCOMPLICATED, TYPE II (BJY-782.95) Her last A1C in 3/11 was elevated at 10. She is on Metformin, glucotrol, and lantus. She has never taken any prandial insulin. After I told her to start considering prandial insulin, her A1C came back at 7.4. Much better. I will call her and let her know the good news.   She normally checks her CBGs once fasting in the AM and after dinner. She has not been able to check recenty b/c her meter broke. Lela was able to get her a replacement. I advised her to  alter when she checks her sugars so that we have values throughout the entire day. Normally her fasting is 170 and post dinner is 200-220.  Eye exam was 3/11 and FLP 6/11 and was controlled.  She has diabetic nephropathy - elevated microalb in 2007, 2009, 2011. She was on lisinopril until 2009 when it was removed from her med list b/c she wasn't taking. it. Microalb was checked 1/11 and was elevated. I need to add lisinopril back and will probably have to remove clonidine  so that her BP doesn't go too low. I will call her and arrange this.  Next visit : see response to lisinopril, check BMP, and microabl.  Her updated medication list for this problem includes:    Bayer Low Strength 81 mg Tbec (Aspirin) .Marland Kitchen... Take 1 tablet by mouth once a day    Metformin Hcl 1000 Mg Tabs (Metformin hcl) .Marland Kitchen... Take 1 tablet by mouth twice a day.    Glucotrol 10 Mg Tabs (Glipizide) .Marland Kitchen... Take 2 tablets in the morning and 2 tablet in the evening by mouth    Lantus 100 Unit/ml Soln (Insulin glargine) ..... Inject 30  units subq at bedtime  Orders: T-Hgb A1C (in-house) ([id number])  Problem # 7:  HYPERLIPIDEMIA (ICD-272.4) Her FLP was checked 3/11 and her LDL was 70 but triglycerides were in the 300's. This is decreased from the prior though. Continue welchol and lipitor.  Her updated medication list for this problem includes:    Welchol 625 Mg Tabs (Colesevelam hcl) .Marland KitchenMarland KitchenMarland KitchenMarland Kitchen 3 tablets once daily    Lipitor 20 Mg Tabs (Atorvastatin calcium) .Marland Kitchen... Take 1 tablet by mouth daily.  Problem # 8:  ALLERGIC RHINITIS (ICD-477.9) Pt uses allegra for this and they are well controlled.   The following medications were removed from the medication list:    Clarinex 5 Mg Tabs (Desloratadine) .Marland Kitchen... Take 1 tablet by mouth daily as needed for allergy.    Promethazine Hcl 25 Mg Tabs (Promethazine hcl) .Marland Kitchen... Take 1 tablet by mouth prn  Problem # 9:  RENAL INSUFFICIENCY (ICD-588.9) Probably multifactorial but does have diabetic  nephropathy. Will start ACEI and follow BMP.  Problem # 10:  PREVENTIVE HEALTH CARE (ICD-V70.0) Colon 06/29/08 and OK. Repeat in 10 yrs. MMG 8/10 - I ordered. Pap all nl, never had precancerous changes. Need to ask if she wasnts schedue for another. Pneumovax - 06/02/09  Complete Medication List: 1)  Plavix 75 Mg Tabs (Clopidogrel bisulfate) .... Take 1 tablet by mouth once a day 2)  Catapres 0.2 Mg Tabs (Clonidine hcl) .... Take 1 tablet by mouth three times a day 3)  Welchol 625 Mg Tabs (Colesevelam hcl) .... 3 tablets once daily 4)  Atenolol 50 Mg Tabs (Atenolol) .... Take 1 tablet by mouth two times a day 5)  Hydroxyzine Hcl 50 Mg Tabs (Hydroxyzine hcl) .... Take 1 tablet by mouth  once a day as needed 6)  Nitrostat 0.4 Mg Subl (Nitroglycerin) .... As needed 7)  Bayer Low Strength 81 Mg Tbec (Aspirin) .... Take 1 tablet by mouth once a day 8)  Norvasc 10 Mg Tabs (Amlodipine besylate) .... Take 1 tablet by mouth daily. 9)  Cvs Iron 325 (65 Fe) Mg Tabs (Ferrous sulfate) .... Take 1 tablet by mouth three times a day 10)  Metformin Hcl 1000 Mg Tabs (Metformin hcl) .... Take 1 tablet by mouth twice a day. 11)  1st Choice Lancets Thin Misc (Lancets) 12)  Lipitor 20 Mg Tabs (Atorvastatin calcium) .... Take 1 tablet by mouth daily. 13)  Glucotrol 10 Mg Tabs (Glipizide) .... Take 2 tablets in the morning and 2 tablet in the evening by mouth 14)  Truetrack Test Strp (Glucose blood) .... Use to test blood sugar twice daily 15)  Lancets Misc (Lancets) .... Use to test blood glucose twice daily 16)  Cobal-1000 1000 Mcg/ml Soln (Cyanocobalamin) .... Inject im daily for 1 week then 66m every week for 4 weeks. 17)  B-12 Monthly  18)  Lantus 100 Unit/ml Soln (Insulin glargine) .... Inject 30  units subq at bedtime 19)  Insulin Syringe 28g X 1/2" 1 Ml Misc (Insulin syringe-needle u-100) .... Use to inject insulin as instructed.  Other Orders: Mammogram (Screening) (Mammo)  Hypertension  Assessment/Plan:      The patient's hypertensive risk group is category C: Target organ damage and/or diabetes.  Today's blood pressure is 104/62.    Lipid Assessment/Plan:      Based on NCEP/ATP III, the patient's risk factor category is "history of coronary disease, peripheral vascular disease, cerebrovascular disease, or aortic aneurysm along with either diabetes, current smoker, or LDL > 130 plus HDL < 40 plus triglycerides > 200".  The patient's lipid goals are as follows: Total cholesterol goal is 200; LDL cholesterol goal is 70; HDL cholesterol goal is 40; Triglyceride goal is 150.      Patient Instructions: 1)  1. Please see me in three Months - November 2)  2. Start thinking about a second insulin before you eat a meal. 3)  3. Call me if you need any medication refills 4)  4. Stop the B 12 pill. Continue the B 12 shots every month    Prevention & Chronic Care Immunizations   Influenza vaccine: Fluvax 3+  (01/06/2009)   Influenza vaccine deferral: Deferred  (10/25/2009)   Influenza vaccine due: 11/18/2010    Tetanus booster: 04/28/2009: Td    Pneumococcal vaccine: Pneumovax  (06/02/2009)    H. zoster vaccine: Not documented   H. zoster vaccine deferral: Deferred  (04/28/2009)  Colorectal Screening   Hemoccult: Not documented   Hemoccult action/deferral: Not indicated  (10/25/2009)    Colonoscopy: 1) Prior right hemi-colectomy 2) Normal in the distal  ileum, biopsied. NORMAL 3) Normal colon, biopsied. NORMAL 4) Normal in the rectum  (06/29/2008)   Colonoscopy action/deferral: Repeat colonoscopy in 10 years.    (06/29/2008)   Colonoscopy due: 06/30/2018  Other Screening   Pap smear: Not documented   Pap smear action/deferral: Deferred  (04/28/2009)    Mammogram: ASSESSMENT: Negative - BI-RADS 1^MM DIGITAL SCREENING  (11/16/2008)   Mammogram action/deferral: Ordered  (10/25/2009)   Mammogram due: 11/06/2008    DXA bone density scan: Not documented   DXA bone  density action/deferral: Ordered  (11/05/2008)   DXA scan due: 11/06/2008    Smoking status: never  (10/25/2009)  Diabetes Mellitus   HgbA1C: 7.4  (10/25/2009)  HgbA1C action/deferral: Ordered  (10/25/2009)   Hemoglobin A1C due: 02/05/2009    Eye exam: No diabetic retinopathy.   Htn changes   (06/08/2009)   Diabetic eye exam action/deferral: Ophthalmology referral  (04/28/2009)   Eye exam due: 06/2010    Foot exam: yes  (06/02/2009)   Foot exam action/deferral: Do today   High risk foot: Not documented   Foot care education: Done  (06/02/2009)   Foot exam due: 12/22/2008    Urine microalbumin/creatinine ratio: 52.4  (04/11/2009)    Diabetes flowsheet reviewed?: Yes   Progress toward A1C goal: Unchanged  Lipids   Total Cholesterol: 177  (08/29/2009)   Lipid panel action/deferral: Lipid Panel ordered   LDL: 70  (08/29/2009)   LDL Direct: Not documented   HDL: 33  (08/29/2009)   Triglycerides: 368  (08/29/2009)    SGOT (AST): 13  (08/29/2009)   BMP action: Ordered   SGPT (ALT): 12  (08/29/2009)   Alkaline phosphatase: 110  (08/29/2009)   Total bilirubin: 0.4  (08/29/2009)    Lipid flowsheet reviewed?: Yes   Progress toward LDL goal: At goal  Hypertension   Last Blood Pressure: 104 / 62  (10/25/2009)   Serum creatinine: 1.27  (08/29/2009)   BMP action: Ordered   Serum potassium 4.7  (08/29/2009)    Hypertension flowsheet reviewed?: Yes   Progress toward BP goal: At goal  Self-Management Support :   Personal Goals (by the next clinic visit) :     Personal A1C goal: 7  (04/28/2009)     Personal blood pressure goal: 130/80  (04/28/2009)     Personal LDL goal: 100  (04/28/2009)    Patient will work on the following items until the next clinic visit to reach self-care goals:     Medications and monitoring: take my medicines every day, check my blood sugar, bring all of my medications to every visit, examine my feet every day  (10/25/2009)     Eating: drink diet  soda or water instead of juice or soda, eat more vegetables, use fresh or frozen vegetables, eat foods that are low in salt, eat baked foods instead of fried foods, eat fruit for snacks and desserts, limit or avoid alcohol  (10/25/2009)     Activity: take a 30 minute walk every day  (10/25/2009)     Other: walks a lot at work  (11/05/2008)    Diabetes self-management support: Resources for patients handout  (10/25/2009)   Last diabetes self-management training by diabetes educator: 11/05/2007    Hypertension self-management support: Resources for patients handout  (10/25/2009)    Lipid self-management support: Resources for patients handout  (10/25/2009)        Resource handout printed.   Nursing Instructions: HgbA1C today (see order) Schedule screening mammogram (see order)    Prevention & Chronic Care Immunizations   Influenza vaccine: Fluvax 3+  (01/06/2009)   Influenza vaccine deferral: Deferred  (10/25/2009)   Influenza vaccine due: 11/18/2010    Tetanus booster: 04/28/2009: Td    Pneumococcal vaccine: Pneumovax  (06/02/2009)    H. zoster vaccine: Not documented   H. zoster vaccine deferral: Deferred  (04/28/2009)  Colorectal Screening   Hemoccult: Not documented   Hemoccult action/deferral: Not indicated  (10/25/2009)    Colonoscopy: 1) Prior right hemi-colectomy 2) Normal in the distal  ileum, biopsied. NORMAL 3) Normal colon, biopsied. NORMAL 4) Normal in the rectum  (06/29/2008)   Colonoscopy action/deferral: Repeat colonoscopy in 10 years.    (06/29/2008)   Colonoscopy  due: 06/30/2018  Other Screening   Pap smear: Not documented   Pap smear action/deferral: Deferred  (04/28/2009)    Mammogram: ASSESSMENT: Negative - BI-RADS 1^MM DIGITAL SCREENING  (11/16/2008)   Mammogram action/deferral: Ordered  (10/25/2009)   Mammogram due: 11/06/2008    DXA bone density scan: Not documented   DXA bone density action/deferral: Ordered  (11/05/2008)   DXA scan due:  11/06/2008    Smoking status: never  (10/25/2009)  Diabetes Mellitus   HgbA1C: 7.4  (10/25/2009)   HgbA1C action/deferral: Ordered  (10/25/2009)   Hemoglobin A1C due: 02/05/2009    Eye exam: No diabetic retinopathy.   Htn changes   (06/08/2009)   Diabetic eye exam action/deferral: Ophthalmology referral  (04/28/2009)   Eye exam due: 06/2010    Foot exam: yes  (06/02/2009)   Foot exam action/deferral: Do today   High risk foot: Not documented   Foot care education: Done  (06/02/2009)   Foot exam due: 12/22/2008    Urine microalbumin/creatinine ratio: 52.4  (04/11/2009)    Diabetes flowsheet reviewed?: Yes   Progress toward A1C goal: Unchanged  Lipids   Total Cholesterol: 177  (08/29/2009)   Lipid panel action/deferral: Lipid Panel ordered   LDL: 70  (08/29/2009)   LDL Direct: Not documented   HDL: 33  (08/29/2009)   Triglycerides: 368  (08/29/2009)    SGOT (AST): 13  (08/29/2009)   BMP action: Ordered   SGPT (ALT): 12  (08/29/2009)   Alkaline phosphatase: 110  (08/29/2009)   Total bilirubin: 0.4  (08/29/2009)    Lipid flowsheet reviewed?: Yes   Progress toward LDL goal: At goal  Hypertension   Last Blood Pressure: 104 / 62  (10/25/2009)   Serum creatinine: 1.27  (08/29/2009)   BMP action: Ordered   Serum potassium 4.7  (08/29/2009)    Hypertension flowsheet reviewed?: Yes   Progress toward BP goal: At goal  Self-Management Support :   Personal Goals (by the next clinic visit) :     Personal A1C goal: 7  (04/28/2009)     Personal blood pressure goal: 130/80  (04/28/2009)     Personal LDL goal: 100  (04/28/2009)    Patient will work on the following items until the next clinic visit to reach self-care goals:     Medications and monitoring: take my medicines every day, check my blood sugar, bring all of my medications to every visit, examine my feet every day  (10/25/2009)     Eating: drink diet soda or water instead of juice or soda, eat more vegetables, use  fresh or frozen vegetables, eat foods that are low in salt, eat baked foods instead of fried foods, eat fruit for snacks and desserts, limit or avoid alcohol  (10/25/2009)     Activity: take a 30 minute walk every day  (10/25/2009)     Other: walks a lot at work  (11/05/2008)    Diabetes self-management support: Resources for patients handout  (10/25/2009)   Last diabetes self-management training by diabetes educator: 11/05/2007    Hypertension self-management support: Resources for patients handout  (10/25/2009)    Lipid self-management support: Resources for patients handout  (10/25/2009)        Resource handout printed.   Nursing Instructions: HgbA1C today (see order) Schedule screening mammogram (see order)   Laboratory Results   Blood Tests   Date/Time Received: October 25, 2009 9:50 AM  Date/Time Reported: Lenoria Farrier  October 25, 2009 9:50 AM   HGBA1C: 7.4%   (  Normal Range: Non-Diabetic - 3-6%   Control Diabetic - 6-8%)       Medication Administration  Injection # 1:    Medication: Vit B12 1000 mcg    Diagnosis: ANEMIA, B12 DEFICIENCY (ICD-281.1)    Route: IM    Site: R deltoid    Exp Date: 06/18/2011    Lot #: [id number]    Mfr: [employer]    Patient tolerated injection without complications    Given by: Gevena Cotton RN (October 25, 2009 10:09 AM)  Orders Added: 1)  T-Hgb A1C (in-house) [[id number]] 2)  Vit B12 1000 mcg [J3420] 3)  Admin of Therapeutic Inj  intramuscular or subcutaneous [[id number]] 4)  Mammogram (Screening) [Mammo] 5)  Est. Patient Level IV [[id number]]

## 2010-04-18 NOTE — Progress Notes (Signed)
Summary: Refill/gh  Phone Note Refill Request Message from:  Fax from Pharmacy on December 01, 2009 4:32 PM  Refills Requested: Medication #1:  NORVASC 10 MG  TABS take 1 tablet by mouth daily.   Last Refilled: 09/05/2009  Medication #2:  GLUCOTROL 10 MG TABS Take 2 tablets in the morning and 2 tablet in the evening by mouth   Last Refilled: 09/05/2009  Method Requested: Electronic Initial call taken by: Sander Nephew RN,  December 01, 2009 4:32 PM  Follow-up for Phone Call       Follow-up by: Larey Dresser MD,  December 01, 2009 5:12 PM    Prescriptions: GLUCOTROL 10 MG TABS (GLIPIZIDE) Take 2 tablets in the morning and 2 tablet in the evening by mouth  #120 x 5   Entered and Authorized by:   Larey Dresser MD   Signed by:   Larey Dresser MD on 12/01/2009   Method used:   Faxed to ...       [facility] (retail)       [place], [place]  [address]       Ph: [phone]       Fax: [phone]   RxID:   [id number] NORVASC 10 MG  TABS (AMLODIPINE BESYLATE) take 1 tablet by mouth daily.  #30 x 5   Entered and Authorized by:   Larey Dresser MD   Signed by:   Larey Dresser MD on 12/01/2009   Method used:   Faxed to ...       [facility] (retail)       [address] [place], [place]  [address]       Ph: [phone]       Fax: [phone]   RxID:   [id number]

## 2010-04-18 NOTE — Assessment & Plan Note (Signed)
Summary: acute-swollen foot/(regal[place])/cfb   Vital Signs:  Patient profile:   65 year old female Height:      64 inches (162.56 cm) Weight:      186.2 pounds (84.64 kg) BMI:     32.65 Temp:     98.7 degrees F (37.06 degrees C) oral Pulse rate:   82 / minute BP sitting:   122 / 75  (right arm) Cuff size:   regular  Vitals Entered By: Lucky Rathke NT II (Aug 16, 2009 3:29 PM) CC: LEFT FOOT WITH SOME SWELLING / MEDICATION REFILL Is Patient Diabetic? Yes Did you bring your meter with you today? Yes Pain Assessment Patient in pain? no      Nutritional Status BMI of > 30 = obese  Have you ever been in a relationship where you felt threatened, hurt or afraid?No   Does patient need assistance? Functional Status Self care Ambulation Normal Comments LEFT FOOT WITH SOME SWELLING/  MEDICATION REFILL   Primary Care Provider:  Niel Hummer MD  CC:  LEFT FOOT WITH SOME SWELLING / MEDICATION REFILL.  History of Present Illness: Ms. olsson is a 65 yo lady with PMH as outlined in the EMR comes today for following.  1. Left foot swelling: It started 5 days ago and was present all over the dorsum of the foot. No trauma. The pain was worse to start with and she took alleve and it resolved. Now the swelling and the pain has improved a lot. Currently there is some redness on the medial aspect of the first metatarsal head. No fever/chills. No similar swelling before. Now the pain is barely perceptible.   2. DM: She takes lantus 25 units and also takes metformin and glucotrol. She checks her blood sugar two times a day. No symptoms of low blood sugar like sweating, anxiety. The blood sugar usually ranges 141 to 356 and on average it runs like in 190's. She saw an eye MD last month.   3. HTN: She is taking all of her BP meds.  4. HL: She is taking her lipitor.    Preventive Screening-Counseling & Management  Alcohol-Tobacco     Alcohol drinks/day: 0     Smoking Status:  never  Caffeine-Diet-Exercise     Does Patient Exercise: yes     Type of exercise: GYN     Exercise (avg: min/session): 1 1/2 HOUR     Times/week: 3  Current Medications (verified): 1)  Plavix 75 Mg Tabs (Clopidogrel Bisulfate) .... Take 1 Tablet By Mouth Once A Day 2)  Catapres 0.2 Mg Tabs (Clonidine Hcl) .... Take 1 Tablet By Mouth Three Times A Day 3)  Welchol 625 Mg Tabs (Colesevelam Hcl) .... 3 Tablets Once Daily 4)  Atenolol 50 Mg Tabs (Atenolol) .... Take 1 Tablet By Mouth Two Times A Day 5)  Clarinex 5 Mg Tabs (Desloratadine) .... Take 1 Tablet By Mouth Daily As Needed For Allergy. 6)  Hydroxyzine Hcl 50 Mg Tabs (Hydroxyzine Hcl) .... Take 1 Tablet By Mouth Once A Day As Needed 7)  Nitrostat 0.4 Mg Subl (Nitroglycerin) .... As Needed 8)  Bayer Low Strength 81 Mg Tbec (Aspirin) .... Take 1 Tablet By Mouth Once A Day 9)  Norvasc 10 Mg  Tabs (Amlodipine Besylate) .... Take 1 Tablet By Mouth Daily. 10)  Cvs Iron 325 (65 Fe) Mg  Tabs (Ferrous Sulfate) .... Take 1 Tablet By Mouth Three Times A Day 11)  Metformin Hcl 1000 Mg Tabs (Metformin Hcl) .... Take 1  Tablet By Mouth Twice A Day. 12)  1st Choice Lancets Thin   Misc (Lancets) 13)  Lipitor 20 Mg Tabs (Atorvastatin Calcium) .... Take 1 Tablet By Mouth Daily. 14)  Glucotrol 10 Mg Tabs (Glipizide) .... Take 2 Tablets in The Morning and 2 Tablet in The Evening By Mouth 15)  Truetrack Test   Strp (Glucose Blood) .... Use To Test Blood Sugar Twice Daily 16)  Lancets   Misc (Lancets) .... Use To Test Blood Glucose Twice Daily 17)  Cobal-1000 1000 Mcg/ml Soln (Cyanocobalamin) .... Inject Im Daily For 1 Week Then 81m Every Week For 4 Weeks. 18)  Vicodin 5-500 Mg Tabs (Hydrocodone-Acetaminophen) .... Take 1 Tablet By Mouth Two Times A Day As Needed For Pain 19)  Promethazine Hcl 25 Mg Tabs (Promethazine Hcl) .... Take 1 Tablet By Mouth Prn 20)  B-12 Monthly 21)  Lantus 100 Unit/ml Soln (Insulin Glargine) .... Inject 30  Units Subq At  Bedtime 22)  Insulin Syringe 28g X 1/2" 1 Ml Misc (Insulin Syringe-Needle U-100) .... Use To Inject Insulin As Instructed. 23)  Vitamin B-12 250 Mcg Tabs (Cyanocobalamin) .... Take 1 Tablet By Mouth Daily.  Allergies: 1)  ! Valium (Diazepam) 2)  ! Morphine Sulfate (Morphine Sulfate) 3)  ! Haldol 4)  ! Darvon 5)  ! Ativan 6)  ! Tramadol Hcl  Social History: Does Patient Exercise:  yes  Review of Systems      See HPI  Physical Exam  General:  alert.   Mouth:  pharynx pink and moist.   Lungs:  normal breath sounds, no crackles, and no wheezes.   Heart:  normal rate, regular rhythm, no murmur, and no gallop.   Msk:  Left foot: No swelling, tenderness, deformity. There is questionable redness on the left first metatarsal head. ROM of the left ankle and all the phalanges are WNL.  Extremities:  trace left pedal edema and trace right pedal edema.   Neurologic:  alert & oriented X3.     Impression & Recommendations:  Problem # 1:  HYPERTENSION (ICD-401.9) BP at goal. Cont same.  Her updated medication list for this problem includes:    Catapres 0.2 Mg Tabs (Clonidine hcl) ..Marland Kitchen.. Take 1 tablet by mouth three times a day    Atenolol 50 Mg Tabs (Atenolol) ..Marland Kitchen.. Take 1 tablet by mouth two times a day    Norvasc 10 Mg Tabs (Amlodipine besylate) ..Marland Kitchen.. Take 1 tablet by mouth daily.  BP today: 122/75 Prior BP: 115/65 (06/02/2009)  Labs Reviewed: K+: 4.2 (04/11/2009) Creat: : 1.32 (04/11/2009)   Chol: 213 (06/02/2009)   HDL: 33 (06/02/2009)   LDL: * mg/dL (06/02/2009)   TG: 727 (06/02/2009)  Problem # 2:  HYPERCHOLESTEROLEMIA (ICD-272.0) Cont same and check FLP.  Her updated medication list for this problem includes:    Welchol 625 Mg Tabs (Colesevelam hcl) ..Marland KitchenMarland KitchenMarland KitchenMarland Kitchen3 tablets once daily    Lipitor 20 Mg Tabs (Atorvastatin calcium) ..Marland Kitchen.. Take 1 tablet by mouth daily.  Problem # 3:  RENAL INSUFFICIENCY (ICD-588.9) Check CMET.   Problem # 4:  DM, UNCOMPLICATED, TYPE II (IHWT-888.28 Her  lowest CBG is in 140s and highest in mid 300 and average in 190's. I will increase her lantus to 30 units. I noted that she is taking metformin and she also has renal insufficiency and so at least in theory she is at risk of lactic acidosis. I will let her PCP decide whether to continue this or not. She will return to clinic in  2 wks for f/u. She saw her optho a month ago.  Her updated medication list for this problem includes:    Bayer Low Strength 81 Mg Tbec (Aspirin) .Marland Kitchen... Take 1 tablet by mouth once a day    Metformin Hcl 1000 Mg Tabs (Metformin hcl) .Marland Kitchen... Take 1 tablet by mouth twice a day.    Glucotrol 10 Mg Tabs (Glipizide) .Marland Kitchen... Take 2 tablets in the morning and 2 tablet in the evening by mouth    Lantus 100 Unit/ml Soln (Insulin glargine) ..... Inject 30  units subq at bedtime  Labs Reviewed: Creat: 1.32 (04/11/2009)     Last Eye Exam: No diabetic retinopathy.   Htn changes  (06/08/2009) Reviewed HgBA1c results: 10.0 (06/02/2009)  13.1 (04/11/2009)  Problem # 5:  FOOT PAIN, LEFT (ICD-729.5) Assessment: Unchanged This is resolved by now. This could be gout and this is her first attack and in addition she never had similar symptoms at other joints except at her left knee 2 years ago. We will follow closely.   Complete Medication List: 1)  Plavix 75 Mg Tabs (Clopidogrel bisulfate) .... Take 1 tablet by mouth once a day 2)  Catapres 0.2 Mg Tabs (Clonidine hcl) .... Take 1 tablet by mouth three times a day 3)  Welchol 625 Mg Tabs (Colesevelam hcl) .... 3 tablets once daily 4)  Atenolol 50 Mg Tabs (Atenolol) .... Take 1 tablet by mouth two times a day 5)  Clarinex 5 Mg Tabs (Desloratadine) .... Take 1 tablet by mouth daily as needed for allergy. 6)  Hydroxyzine Hcl 50 Mg Tabs (Hydroxyzine hcl) .... Take 1 tablet by mouth once a day as needed 7)  Nitrostat 0.4 Mg Subl (Nitroglycerin) .... As needed 8)  Bayer Low Strength 81 Mg Tbec (Aspirin) .... Take 1 tablet by mouth once a day 9)   Norvasc 10 Mg Tabs (Amlodipine besylate) .... Take 1 tablet by mouth daily. 10)  Cvs Iron 325 (65 Fe) Mg Tabs (Ferrous sulfate) .... Take 1 tablet by mouth three times a day 11)  Metformin Hcl 1000 Mg Tabs (Metformin hcl) .... Take 1 tablet by mouth twice a day. 12)  1st Choice Lancets Thin Misc (Lancets) 13)  Lipitor 20 Mg Tabs (Atorvastatin calcium) .... Take 1 tablet by mouth daily. 14)  Glucotrol 10 Mg Tabs (Glipizide) .... Take 2 tablets in the morning and 2 tablet in the evening by mouth 15)  Truetrack Test Strp (Glucose blood) .... Use to test blood sugar twice daily 16)  Lancets Misc (Lancets) .... Use to test blood glucose twice daily 17)  Cobal-1000 1000 Mcg/ml Soln (Cyanocobalamin) .... Inject im daily for 1 week then 54m every week for 4 weeks. 18)  Vicodin 5-500 Mg Tabs (Hydrocodone-acetaminophen) .... Take 1 tablet by mouth two times a day as needed for pain 19)  Promethazine Hcl 25 Mg Tabs (Promethazine hcl) .... Take 1 tablet by mouth prn 20)  B-12 Monthly  21)  Lantus 100 Unit/ml Soln (Insulin glargine) .... Inject 30  units subq at bedtime 22)  Insulin Syringe 28g X 1/2" 1 Ml Misc (Insulin syringe-needle u-100) .... Use to inject insulin as instructed. 23)  Vitamin B-12 250 Mcg Tabs (Cyanocobalamin) .... Take 1 tablet by mouth daily.  Other Orders: T-Lipid Profile (([id number] T-Comprehensive Metabolic Panel ([id number]  Patient Instructions: 1)  Please schedule a follow-up appointment in 2 weeks. 2)  Limit your Sodium (Salt) to less than 2 grams a day(slightly less than 1/2 a teaspoon) to prevent fluid retention,  swelling, or worsening of symptoms. 3)  It is important that you exercise regularly at least 20 minutes 5 times a week. If you develop chest pain, have severe difficulty breathing, or feel very tired , stop exercising immediately and seek medical attention. 4)  You need to lose weight. Consider a lower calorie diet and regular exercise.  5)  Check your Blood  Pressure regularly. If it is above: you should make an appointment. Prescriptions: ATENOLOL 50 MG TABS (ATENOLOL) Take 1 tablet by mouth two times a day  #60 x 0   Entered and Authorized by:   Flo Shanks MD   Signed by:   Flo Shanks MD on 08/16/2009   Method used:   Faxed to ...       [facility] (retail)       [address] [place], [place]  [address]       Ph: [phone]       Fax: [phone]   RxID:   [id number]  Process Orders Check Orders Results:     [facility]: ABN not required for this insurance Tests Sent for requisitioning (Aug 16, 2009 4:11 PM):     08/16/2009: [facility] -- T-Lipid Profile [id number] (signed)     08/16/2009: [facility] -- T-Comprehensive Metabolic Panel [[id number]] (signed)    Prevention & Chronic Care Immunizations   Influenza vaccine: Fluvax 3+  (01/06/2009)   Influenza vaccine due: 11/18/2010    Tetanus booster: 04/28/2009: Td    Pneumococcal vaccine: Pneumovax  (06/02/2009)    H. zoster vaccine: Not documented   H. zoster vaccine deferral: Deferred  (04/28/2009)  Colorectal Screening   Hemoccult: Not documented   Hemoccult action/deferral: Deferred  (04/28/2009)    Colonoscopy: 1) Prior right hemi-colectomy 2) Normal in the distal  ileum, biopsied. NORMAL 3) Normal colon, biopsied. NORMAL 4) Normal in the rectum  (06/29/2008)   Colonoscopy action/deferral: Repeat colonoscopy in 10 years.    (06/29/2008)   Colonoscopy due: 06/30/2018  Other Screening   Pap smear: Not documented   Pap smear action/deferral: Deferred  (04/28/2009)    Mammogram: ASSESSMENT: Negative - BI-RADS 1^MM DIGITAL SCREENING  (11/16/2008)   Mammogram action/deferral: Ordered  (11/05/2008)   Mammogram due: 11/06/2008    DXA bone density scan: Not documented   DXA bone density action/deferral: Ordered  (11/05/2008)   DXA scan due:  11/06/2008    Smoking status: never  (08/16/2009)  Diabetes Mellitus   HgbA1C: 10.0  (06/02/2009)   Hemoglobin A1C due: 02/05/2009    Eye exam: No diabetic retinopathy.   Htn changes   (06/08/2009)   Diabetic eye exam action/deferral: Ophthalmology referral  (04/28/2009)   Eye exam due: 06/2010    Foot exam: yes  (06/02/2009)   Foot exam action/deferral: Do today   High risk foot: Not documented   Foot care education: Done  (06/02/2009)   Foot exam due: 12/22/2008    Urine microalbumin/creatinine ratio: 52.4  (04/11/2009)    Diabetes flowsheet reviewed?: Yes   Progress toward A1C goal: Unchanged  Lipids   Total Cholesterol: 213  (06/02/2009)   Lipid panel action/deferral: Lipid Panel ordered   LDL: * mg/dL  (06/02/2009)   LDL Direct: Not documented   HDL: 33  (06/02/2009)   Triglycerides: 727  (06/02/2009)    SGOT (AST): 15  (05/05/2008)   BMP action: Ordered   SGPT (ALT): 15  (05/05/2008) CMP ordered    Alkaline phosphatase: 88  (  05/05/2008)   Total bilirubin: 0.3  (05/05/2008)    Lipid flowsheet reviewed?: Yes   Progress toward LDL goal: Unchanged  Hypertension   Last Blood Pressure: 122 / 75  (08/16/2009)   Serum creatinine: 1.32  (04/11/2009)   BMP action: Ordered   Serum potassium 4.2  (04/11/2009) CMP ordered     Hypertension flowsheet reviewed?: Yes   Progress toward BP goal: At goal  Self-Management Support :   Personal Goals (by the next clinic visit) :     Personal A1C goal: 7  (04/28/2009)     Personal blood pressure goal: 130/80  (04/28/2009)     Personal LDL goal: 100  (04/28/2009)    Patient will work on the following items until the next clinic visit to reach self-care goals:     Medications and monitoring: take my medicines every day, check my blood sugar, bring all of my medications to every visit, examine my feet every day  (08/16/2009)     Eating: drink diet soda or water instead of juice or soda, eat more vegetables, use fresh or  frozen vegetables, eat foods that are low in salt, eat baked foods instead of fried foods, eat fruit for snacks and desserts, limit or avoid alcohol  (08/16/2009)     Activity: take a 30 minute walk every day  (08/16/2009)     Other: walks a lot at work  (11/05/2008)    Diabetes self-management support: Resources for patients handout, Written self-care plan  (08/16/2009)   Diabetes care plan printed   Last diabetes self-management training by diabetes educator: 11/05/2007    Hypertension self-management support: Resources for patients handout, Written self-care plan  (08/16/2009)   Hypertension self-care plan printed.    Lipid self-management support: Resources for patients handout, Written self-care plan  (08/16/2009)   Lipid self-care plan printed.    Self-management comments: PATIENT HAS JOINED THE [facility]      Resource handout printed.   Process Orders Check Orders Results:     [facility]: NPY not required for this insurance Tests Sent for requisitioning (Aug 16, 2009 4:11 PM):     08/16/2009: [facility] -- T-Lipid Profile [phone] (signed)     08/16/2009: [facility] -- T-Comprehensive Metabolic Panel [[id number]] (signed)

## 2010-04-18 NOTE — Progress Notes (Signed)
Summary: refill/gg  Phone Note Refill Request  on September 26, 2009 5:10 PM  Refills Requested: Medication #1:  ATENOLOL 50 MG TABS Take 1 tablet by mouth two times a day   Last Refilled: 08/16/2009  Method Requested: Fax to [facility] Initial call taken by: Gevena Cotton RN,  September 26, 2009 5:11 PM  Follow-up for Phone Call        Has appt in Aug with me Follow-up by: Larey Dresser MD,  September 26, 2009 7:15 PM    Prescriptions: ATENOLOL 50 MG TABS (ATENOLOL) Take 1 tablet by mouth two times a day  #60 x 1   Entered and Authorized by:   Larey Dresser MD   Signed by:   Larey Dresser MD on 09/26/2009   Method used:   Faxed to ...       [facility] (retail)       [address] [place],   [address]       Ph: [phone]       Fax: [phone]   RxID:   [id number]

## 2010-04-18 NOTE — Progress Notes (Signed)
Summary: refill/gg  Phone Note Refill Request  on June 07, 2009 4:44 PM  Allegra is no longer available with [facility].  they can order clarinex and xyzal.  Will you change med and order for 3 months?   Method Requested: Fax to [facility] Initial call taken by: Gevena Cotton RN,  June 07, 2009 4:45 PM    New/Updated Medications: CLARINEX 5 MG TABS (DESLORATADINE) Take 1 tablet by mouth daily as needed for allergy.

## 2010-04-18 NOTE — Progress Notes (Signed)
Summary: new to insulin follow-up/support/dmr  Phone Note Outgoing Call   Call placed by: Barnabas Harries RD,CDE,  April 25, 2009 10:46 AM Summary of Call: tried to call patient twice today earlier and now (closer to 5 pm) to see how she is doing with her  Initial call taken by: Barnabas Harries RD,CDE,  April 25, 2009 4:45 PM  Follow-up for Phone Call        called patient again today to follow-up on new to insulin and provide support.  Follow-up by: Barnabas Harries RD,CDE,  April 26, 2009 4:14 PM  Additional Follow-up for Phone Call Additional follow up Details #1::        note patient has an appointment 2.10.11 with doctor. we can provide support, answer questions then Additional Follow-up by: Barnabas Harries RD,CDE,  April 27, 2009 9:07 AM

## 2010-04-18 NOTE — Assessment & Plan Note (Signed)
Summary: ? URI hoarse   Vital Signs:  Patient profile:   65 year old female Height:      64 inches (162.56 cm) Weight:      183.9 pounds (83.59 kg) BMI:     31.68 O2 Sat:      97 % on Room air Temp:     99.4 degrees F (37.44 degrees C) oral Pulse rate:   78 / minute BP sitting:   135 / 76  (right arm)  Vitals Entered By: Hilda Blades Ditzler RN (January 03, 2010 8:53 AM)  O2 Flow:  Room air Is Patient Diabetic? Yes Did you bring your meter with you today? Yes Pain Assessment Patient in pain? no      Nutritional Status BMI of > 30 = obese Nutritional Status Detail appetite down CBG Result 153  Have you ever been in a relationship where you felt threatened, hurt or afraid?denies   Does patient need assistance? Functional Status Self care Ambulation Normal Comments Nonproductive cough x 2 weeks. Denies sore throat or head congestion. ? hot flashes x 2 months day and night for 5 min. LMP - hyst.   Primary Care Provider:  Niel Hummer MD   History of Present Illness: 65 yr old woman with pmhx as described below comes to the clinic complaining of URI for 2 weeks. Dry cough, denies fevers or chills, fells congested. Patient has taken robutussion without relief. Denies sick contacts, sinus congestion.   Depression History:      The patient denies a depressed mood most of the day and a diminished interest in her usual daily activities.         Preventive Screening-Counseling & Management  Alcohol-Tobacco     Alcohol drinks/day: 0     Smoking Status: never  Caffeine-Diet-Exercise     Does Patient Exercise: yes     Type of exercise: GYM     Exercise (avg: min/session): 1 1/2 HOUR     Times/week: 3  Problems Prior to Update: 1)  Allergic Rhinitis  (ICD-477.9) 2)  Cva  (ICD-434.91) 3)  Hypertension  (ICD-401.9) 4)  Cad  (ICD-414.00) 5)  Gerd  (ICD-530.81) 6)  Hypercholesterolemia  (ICD-272.0) 7)  Renal Insufficiency  (ICD-588.9) 8)  Hypomagnesemia  (ICD-275.2) 9)   Hypokalemia  (ICD-276.8) 10)  Spinal Stenosis, Lumbar  (ICD-724.02) 11)  Anemia, B12 Deficiency  (ICD-281.1) 12)  Dm, Uncomplicated, Type II  (YKD-983.38) 13)  Hyperlipidemia  (ICD-272.4) 14)  Diarrhea, Chronic  (ICD-787.91) 15)  Preventive Health Care  (ICD-V70.0) 16)  Family Hsitory Breast Cancer 1st Degree Relative <50  (ICD-V16.3) 17)  Obesity, Morbid  (ICD-278.01) 18)  Hiatal Hernia With Reflux  (ICD-553.3)  Medications Prior to Update: 1)  Plavix 75 Mg Tabs (Clopidogrel Bisulfate) .... Take 1 Tablet By Mouth Once A Day 2)  Welchol 625 Mg Tabs (Colesevelam Hcl) .... 3 Tablets Once Daily 3)  Atenolol 50 Mg Tabs (Atenolol) .... Take 1 Tablet By Mouth Two Times A Day 4)  Hydroxyzine Hcl 50 Mg Tabs (Hydroxyzine Hcl) .... Take 1 Tablet By Mouth Once A Day As Needed 5)  Nitrostat 0.4 Mg Subl (Nitroglycerin) .... As Needed 6)  Bayer Low Strength 81 Mg Tbec (Aspirin) .... Take 1 Tablet By Mouth Once A Day 7)  Norvasc 10 Mg  Tabs (Amlodipine Besylate) .... Take 1 Tablet By Mouth Daily. 8)  Cvs Iron 325 (65 Fe) Mg  Tabs (Ferrous Sulfate) .... Take 1 Tablet By Mouth Three Times A Day 9)  Metformin Hcl  1000 Mg Tabs (Metformin Hcl) .... Take 1 Tablet By Mouth Twice A Day. 10)  1st Choice Lancets Thin   Misc (Lancets) 11)  Lipitor 20 Mg Tabs (Atorvastatin Calcium) .... Take 1 Tablet By Mouth Daily. 12)  Glucotrol 10 Mg Tabs (Glipizide) .... Take 2 Tablets in The Morning and 2 Tablet in The Evening By Mouth 13)  Truetrack Test   Strp (Glucose Blood) .... Use To Test Blood Sugar Twice Daily 14)  Lancets   Misc (Lancets) .... Use To Test Blood Glucose Twice Daily 15)  Cobal-1000 1000 Mcg/ml Soln (Cyanocobalamin) .... Inject Im Daily For 1 Week Then 35m Every Week For 4 Weeks. 16)  B-12 Monthly 17)  Lantus 100 Unit/ml Soln (Insulin Glargine) .... Inject 30  Units Subq At Bedtime 18)  Insulin Syringe 28g X 1/2" 1 Ml Misc (Insulin Syringe-Needle U-100) .... Use To Inject Insulin As Instructed. 19)   Quinapril Hcl 10 Mg Tabs (Quinapril Hcl) .... One Tablet By Mouth Once A Day  Current Medications (verified): 1)  Plavix 75 Mg Tabs (Clopidogrel Bisulfate) .... Take 1 Tablet By Mouth Once A Day 2)  Welchol 625 Mg Tabs (Colesevelam Hcl) .... 3 Tablets Once Daily 3)  Atenolol 50 Mg Tabs (Atenolol) .... Take 1 Tablet By Mouth Two Times A Day 4)  Hydroxyzine Hcl 50 Mg Tabs (Hydroxyzine Hcl) .... Take 1 Tablet By Mouth Once A Day As Needed 5)  Nitrostat 0.4 Mg Subl (Nitroglycerin) .... As Needed 6)  Bayer Low Strength 81 Mg Tbec (Aspirin) .... Take 1 Tablet By Mouth Once A Day 7)  Norvasc 10 Mg  Tabs (Amlodipine Besylate) .... Take 1 Tablet By Mouth Daily. 8)  Cvs Iron 325 (65 Fe) Mg  Tabs (Ferrous Sulfate) .... Take 1 Tablet By Mouth Three Times A Day 9)  Metformin Hcl 1000 Mg Tabs (Metformin Hcl) .... Take 1 Tablet By Mouth Twice A Day. 10)  1st Choice Lancets Thin   Misc (Lancets) 11)  Lipitor 20 Mg Tabs (Atorvastatin Calcium) .... Take 1 Tablet By Mouth Daily. 12)  Glucotrol 10 Mg Tabs (Glipizide) .... Take 2 Tablets in The Morning and 2 Tablet in The Evening By Mouth 13)  Truetrack Test   Strp (Glucose Blood) .... Use To Test Blood Sugar Twice Daily 14)  Lancets   Misc (Lancets) .... Use To Test Blood Glucose Twice Daily 15)  Cobal-1000 1000 Mcg/ml Soln (Cyanocobalamin) .... Inject Im Daily For 1 Week Then 154mEvery Week For 4 Weeks. 16)  B-12 Monthly 17)  Lantus 100 Unit/ml Soln (Insulin Glargine) .... Inject 30  Units Subq At Bedtime 18)  Insulin Syringe 28g X 1/2" 1 Ml Misc (Insulin Syringe-Needle U-100) .... Use To Inject Insulin As Instructed. 19)  Quinapril Hcl 10 Mg Tabs (Quinapril Hcl) .... One Tablet By Mouth Once A Day  Allergies: 1)  ! Valium (Diazepam) 2)  ! Morphine Sulfate (Morphine Sulfate) 3)  ! Haldol 4)  ! Darvon 5)  ! Ativan 6)  ! Tramadol Hcl  Past History:  Past Medical History: Last updated: 10/23/2009 CAD      1996 - PTCA and angioplasty diagonal branch       2000 - Rotoblator & angioplasty of diagonal      2006 - subendocardial AMI, DES to poximal LAD. Also had 90% stenosis in distal apical LAD. EF 55%.                    Apical hypokinesis  Indefinite ASA and Plavix  HTN      2006 B renal arteries patient      2003 MRA - no RAS      2003 - pheo W/U Dr Hassell Done reportedly negative  Chronic N / V / D      Presumptive dx Crohn's dx per elevated P ANCA      Failed Entocort and Pentasa      Sep 2003 - Ileocolectomy c anastomosis Dr Deon Pilling 2/2 adhesions - path was NEGATIVE for Crohns       EGD, small-bowel follow-through (11/03), and an enteroclysis (10/03)  were unrevealing.            Causes hypo Mag and hypocalcemia  Anemia - multifactorial      B12 def - 150 in 3/10      Fe def - ferritin 35 3/10      Both are being repleted  CVA      Incidental finding MRI 2002 L lacunar infarct  DM II insulin dependent  Hyperlipidemia  Seasonal allergies  Chronic pain      CT 10/10 Spinal stenosis L2 - S1  CRI  Adnexal mass 8/03      s/p Laparoscopic BSO (R ovarian fibroma) & lysis of adhesion   Past Surgical History: Last updated: 01/19/2009 S/P PTCA   PROCEDURE:  Drug-eluting stent placement in the proximal LAD. Successful drug-eluting stent placement in the proximal LAD.  The patient will be continued on Plavix for one year.  Aspirin will be continued indefinitely.  She will be ambulated in two hours.  Attention will be given to improved hypertension control. WED/MEDQ  D:  06/30/2004  T:  06/30/2004  Job:  [id number] cc:   [name], M.D. Christena Deem. Melvyn Novas, M.D. [facility]  Small bowel resection 2003 (Right-hemicolectomy) Cholecystectomy TAH /BSO 2003 Ventral hernia repair hysterectomy.   cholecystectomy.  Status post ventral hernia repair.  .Status post lumbar laminectomy.  .Status post ileal colectomy with anastomosis.   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 Daily Caffeine Use -3 Illicit Drug Use - no  Risk Factors: Alcohol Use: 0 (01/03/2010) Exercise: yes (01/03/2010)  Risk Factors: Smoking Status: never (01/03/2010)  Family History: Reviewed history from 11/05/2008 and no changes required. Family History Breast cancer 1st degree relative <50 Family History of CAD Female 1st degree relative <60 Family History Diabetes 1st degree relative - MGM, 73 Brother died of pancreatic cancer at age 44  Social History: Reviewed history from 11/05/2008 and no changes required. 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 Daily Caffeine Use -3 Illicit Drug Use - no  Review of Systems  The patient denies fever, chest pain, dyspnea on exertion, hemoptysis, abdominal pain, melena, hematochezia, and hematuria.    Physical Exam  General:  NAD Ears:  R and L cerumen impaction.   Mouth:  pharyngeal erythema.  no exudates Neck:  supple.   Lungs:  normal breath sounds, no crackles, and no wheezes.   Heart:  normal rate, regular rhythm, no murmur, and no gallop.   Abdomen:  soft,  non-tender, normal bowel sounds, and no distention.   Msk:  normal ROM.   Extremities:  no edema   Neurologic:  alert & oriented X3.    Diabetes Management Exam:    Foot Exam (with socks and/or shoes not present):       Sensory-Monofilament:          Left foot: normal          Right foot: normal   Impression & Recommendations:  Problem # 1:  COUGH (ICD-786.2) Most likely a viral URTI. Symptomatic treatment. Review labs and chest xray  rule out pneumonia.   Orders: T-CBC w/Diff ([id number]) CXR- 2view (CXR)  Problem # 2:  HYPERTENSION (ICD-401.9) Continue current regimen. Will check bmet as recently placed on quinapril.  Her updated medication list for this problem includes:    Atenolol 50 Mg Tabs (Atenolol) .Marland Kitchen... Take 1 tablet by mouth two times a day    Norvasc 10 Mg Tabs (Amlodipine besylate) .Marland Kitchen... Take 1 tablet by mouth daily.    Quinapril Hcl 10 Mg Tabs (Quinapril hcl) ..... One tablet by mouth once a day  Orders: T-Basic Metabolic Panel ([id number])  BP today: 135/76 Prior BP: 104/62 (10/25/2009)  Prior 10 Yr Risk Heart Disease: N/A (10/25/2009)  Labs Reviewed: K+: 4.7 (08/29/2009) Creat: : 1.27 (08/29/2009)   Chol: 177 (08/29/2009)   HDL: 33 (08/29/2009)   LDL: 70 (08/29/2009)   TG: 368 (08/29/2009)  Problem # 3:  DM, UNCOMPLICATED, TYPE II (LZJ-673.41) Continue current treatment. Will follow up in one month for diabetes management. Diabetic foot exam done today.   Her updated medication list for this problem includes:    Bayer Low Strength 81 Mg Tbec (Aspirin) .Marland Kitchen... Take 1 tablet by mouth once a day    Metformin Hcl 1000 Mg Tabs (Metformin hcl) .Marland Kitchen... Take 1 tablet by mouth twice a day.    Glucotrol 10 Mg Tabs (Glipizide) .Marland Kitchen... Take 2 tablets in the morning and 2 tablet in the evening by mouth    Lantus 100 Unit/ml Soln (Insulin glargine) ..... Inject 30  units subq at bedtime    Quinapril Hcl 10 Mg Tabs (Quinapril hcl) ..... One tablet by mouth once a day  Labs Reviewed: Creat: 1.27 (08/29/2009)     Last Eye Exam: No diabetic retinopathy.   Htn changes  (06/08/2009) Reviewed HgBA1c results: 7.4 (10/25/2009)  10.0 (06/02/2009)  Problem # 4:  ANEMIA, B12 DEFICIENCY (ICD-281.1) Vitamin B12 shot given today.  Her updated medication list for this problem includes:    Cvs Iron 325 (65 Fe) Mg Tabs (Ferrous sulfate) .Marland Kitchen... Take 1 tablet by mouth three times a day    Cobal-1000 1000 Mcg/ml Soln  (Cyanocobalamin) ..... Inject im daily for 1 week then 53m every week for 4 weeks.  Orders: Admin of Therapeutic Inj  intramuscular or subcutaneous (([id number] Vit B12 1000 mcg ([id number]  Problem # 5:  CERUMEN IMPACTION, BILATERAL (ICD-380.4)  Ears cleaned out today.  Orders: Cerumen Impaction Removal (([id number]  Complete Medication List: 1)  Plavix 75 Mg Tabs (Clopidogrel bisulfate) .... Take 1 tablet by mouth once a day 2)  Welchol 625 Mg Tabs (Colesevelam hcl) .... 3 tablets once daily 3)  Atenolol 50 Mg Tabs (Atenolol) .... Take 1 tablet by mouth two times a day 4)  Hydroxyzine Hcl 50 Mg Tabs (Hydroxyzine hcl) .... Take 1 tablet by mouth once a day as needed 5)  Nitrostat 0.4 Mg Subl (Nitroglycerin) .... As needed 6)  Bayer Low Strength 81 Mg Tbec (  Aspirin) .... Take 1 tablet by mouth once a day 7)  Norvasc 10 Mg Tabs (Amlodipine besylate) .... Take 1 tablet by mouth daily. 8)  Cvs Iron 325 (65 Fe) Mg Tabs (Ferrous sulfate) .... Take 1 tablet by mouth three times a day 9)  Metformin Hcl 1000 Mg Tabs (Metformin hcl) .... Take 1 tablet by mouth twice a day. 10)  1st Choice Lancets Thin Misc (Lancets) 11)  Lipitor 20 Mg Tabs (Atorvastatin calcium) .... Take 1 tablet by mouth daily. 12)  Glucotrol 10 Mg Tabs (Glipizide) .... Take 2 tablets in the morning and 2 tablet in the evening by mouth 13)  Truetrack Test Strp (Glucose blood) .... Use to test blood sugar twice daily 14)  Lancets Misc (Lancets) .... Use to test blood glucose twice daily 15)  Cobal-1000 1000 Mcg/ml Soln (Cyanocobalamin) .... Inject im daily for 1 week then 20m every week for 4 weeks. 16)  B-12 Monthly  17)  Lantus 100 Unit/ml Soln (Insulin glargine) .... Inject 30  units subq at bedtime 18)  Insulin Syringe 28g X 1/2" 1 Ml Misc (Insulin syringe-needle u-100) .... Use to inject insulin as instructed. 19)  Quinapril Hcl 10 Mg Tabs (Quinapril hcl) .... One tablet by mouth once a day 20)  Diabetic Tussin Dm 100-10 Mg/564mLiqd  (Dextromethorphan-guaifenesin) .... Take 1020my mouth every 4 hours as needed for cough  Other Orders: Capillary Blood Glucose/CBG ([id number] Instructions: 1)  Please schedule a follow-up appointment in 1 month. 2)  Take all medication as directed. 3)  You will be called with any abnormalities in the tests scheduled or performed today.  If you don't hear from us Kore[place]in a week from when the test was performed, you can assume that your test was normal.  Prescriptions: DIABETIC TUSSIN DM 100-10 MG/5ML LIQD (DEXTROMETHORPHAN-GUAIFENESIN) Take 26m44m mouth every 4 hours as needed for cough  #240ml40m   Entered and Authorized by:   RamseRudie Meyer Signed by:   RamseRudie Meyern 01/03/2010   Method used:   F[facility]ed to ...       [facility]       [address] [id number]  Ph: [phone]  Fax: [phone]:   [id number] Administration  Injection # 1:    Medication: Vit B12 1000 mcg    Diagnosis: ANEMIA, B12 DEFICIENCY (ICD-281.1)    Route: IM    Site: R deltoid    Exp Date: 07/2011    Lot #: [id number]: APP Pharm    Patient tolerated injection without complications    Given by: DebraHilda Bladesler RN (January 03, 2010 9:17 AM)  Orders Added: 1)  Capillary Blood Glucose/CBG [82948] 2)  T-CBC w/Diff [[id number]]CXR- 2view [CXR] 4)  Admin of Therapeutic Inj  intramuscular or subcutaneous [96372] 5)  Vit B12 1000 mcg [J3420] 6)  T-Basic Metabolic Panel [[id number]]Cerumen Impaction Removal [6921[[id number]]Est. Patient Level III [9921[[id number]]ocess Orders Check Orders Results:     [facility]: ABN nOFBrequired for this insurance Tests Sent for requisitioning (January 03, 2010 10:19 AM):     01/03/2010: [facility] -- T-CBC w/Diff [[id number]]ned)     01/03/2010: [facility] -- T-Basic Metabolic Panel  [[id number]]  signed)    Prevention & Chronic Care Immunizations   Influenza vaccine: Fluvax 3+  (01/06/2009)   Influenza vaccine deferral: Deferred  (10/25/2009)   Influenza vaccine due: 11/18/2010    Tetanus booster: 04/28/2009: Td    Pneumococcal vaccine: Pneumovax  (06/02/2009)    H. zoster vaccine: Not documented   H. zoster vaccine deferral: Deferred  (04/28/2009)  Colorectal Screening   Hemoccult: Not documented   Hemoccult action/deferral: Not indicated  (10/25/2009)    Colonoscopy: 1) Prior right hemi-colectomy 2) Normal in the distal  ileum, biopsied. NORMAL 3) Normal colon, biopsied. NORMAL 4) Normal in the rectum  (06/29/2008)   Colonoscopy action/deferral: Repeat colonoscopy in 10 years.    (06/29/2008)   Colonoscopy due: 06/30/2018  Other Screening   Pap smear: Not documented   Pap smear action/deferral: Deferred  (04/28/2009)    Mammogram: ASSESSMENT: Negative - BI-RADS 1^MM DIGITAL SCREENING  (11/17/2009)   Mammogram action/deferral: Ordered  (10/25/2009)   Mammogram due: 11/06/2008    DXA bone density scan: Not documented   DXA bone density action/deferral: Ordered  (11/05/2008)   DXA scan due: 11/06/2008    Smoking status: never  (01/03/2010)  Diabetes Mellitus   HgbA1C: 7.4  (10/25/2009)   HgbA1C action/deferral: Ordered  (10/25/2009)   Hemoglobin A1C due: 02/05/2009    Eye exam: No diabetic retinopathy.   Htn changes   (06/08/2009)   Diabetic eye exam action/deferral: Ophthalmology referral  (04/28/2009)   Eye exam due: 06/2010    Foot exam: yes  (01/03/2010)   Foot exam action/deferral: Do today   High risk foot: No  (01/03/2010)   Foot care education: Done  (06/02/2009)   Foot exam due: 12/22/2008    Urine microalbumin/creatinine ratio: 52.4  (04/11/2009)    Diabetes flowsheet reviewed?: Yes   Progress toward A1C goal: Improved  Lipids   Total Cholesterol: 177  (08/29/2009)   Lipid panel action/deferral: Lipid Panel ordered    LDL: 70  (08/29/2009)   LDL Direct: Not documented   HDL: 33  (08/29/2009)   Triglycerides: 368  (08/29/2009)    SGOT (AST): 13  (08/29/2009)   BMP action: Ordered   SGPT (ALT): 12  (08/29/2009)   Alkaline phosphatase: 110  (08/29/2009)   Total bilirubin: 0.4  (08/29/2009)    Lipid flowsheet reviewed?: Yes   Progress toward LDL goal: At goal  Hypertension   Last Blood Pressure: 135 / 76  (01/03/2010)   Serum creatinine: 1.27  (08/29/2009)   BMP action: Ordered   Serum potassium 4.7  (08/29/2009)    Hypertension flowsheet reviewed?: Yes   Progress toward BP goal: Deteriorated  Self-Management Support :   Personal Goals (by the next clinic visit) :     Personal A1C goal: 7  (04/28/2009)     Personal blood pressure goal: 130/80  (04/28/2009)     Personal LDL goal: 100  (04/28/2009)    Patient will work on the following items until the next clinic visit to reach self-care goals:     Medications and monitoring: take my medicines every day, check my blood sugar, check my blood pressure, bring all of my medications to every visit, weigh myself weekly, examine my feet every day  (01/03/2010)     Eating: drink diet soda or water instead of juice or soda, eat more vegetables, use fresh or frozen vegetables, eat baked foods instead of fried foods, eat fruit for snacks and desserts, limit or avoid alcohol  (01/03/2010)     Activity: take a  30 minute walk every day  (01/03/2010)     Other: walks a lot at work  (11/05/2008)    Diabetes self-management support: Written self-care plan, Education handout, Resources for patients handout  (01/03/2010)   Diabetes care plan printed   Diabetes education handout printed   Last diabetes self-management training by diabetes educator: 11/05/2007    Hypertension self-management support: Written self-care plan, Education handout, Resources for patients handout  (01/03/2010)   Hypertension self-care plan printed.   Hypertension education handout  printed    Lipid self-management support: Written self-care plan, Education handout, Resources for patients handout  (01/03/2010)   Lipid self-care plan printed.   Lipid education handout printed      Resource handout printed.   Last LDL:                                                 70 (08/29/2009 7:54:00 PM)        Diabetic Foot Exam Foot Inspection Is there a history of a foot ulcer?              No Is there a foot ulcer now?              No Can the patient see the bottom of their feet?          Yes Are the shoes appropriate in style and fit?          Yes Is there swelling or an abnormal foot shape?          No Are the toenails long?                No Are the toenails thick?                No Are the toenails ingrown?              No Is there heavy callous build-up?              No Is there a claw toe deformity?                          No Is there elevated skin temperature?            No Is there limited ankle dorsiflexion?            No Is there foot or ankle muscle weakness?            No Do you have pain in calf while walking?           No      Pulse Check          Right Foot          Left Foot Posterior Tibial:        3+            3+ Dorsalis Pedis:        3+            3+  High Risk Feet? No   10-g (5.07) Semmes-Weinstein Monofilament Test Performed by: Hilda Blades Ditzler RN          Right Foot          Left Foot Visual Inspection               Test  Control      normal         normal Site 1         normal         normal Site 2         normal         normal Site 3         normal         normal Site 4         normal         normal Site 5         normal         normal Site 6         normal         normal Site 7         normal         normal Site 8         normal         normal Site 9         normal         normal Site 10         normal         normal  Impression      normal         normal   Medication Administration  Injection # 1:    Medication: Vit  B12 1000 mcg    Diagnosis: ANEMIA, B12 DEFICIENCY (ICD-281.1)    Route: IM    Site: R deltoid    Exp Date: 07/2011    Lot #: [id number]    Mfr: APP Pharm    Patient tolerated injection without complications    Given by: Hilda Blades Ditzler RN (January 03, 2010 9:17 AM)  Orders Added: 1)  Capillary Blood Glucose/CBG [82948] 2)  T-CBC w/Diff [[id number]] 3)  CXR- 2view [CXR] 4)  Admin of Therapeutic Inj  intramuscular or subcutaneous [96372] 5)  Vit B12 1000 mcg [J3420] 6)  T-Basic Metabolic Panel [[id number]] 7)  Cerumen Impaction Removal [69210] 8)  Est. Patient Level III [03013]

## 2010-04-18 NOTE — Letter (Signed)
Summary: BLOOD GLUCOSE /10-06-2009/01-03-2010  BLOOD GLUCOSE /10-06-2009/01-03-2010   Imported By: Garlan Fillers 01/03/2010 14:52:55  _____________________________________________________________________  External Attachment:    Type:   Image     Comment:   External Document

## 2010-04-18 NOTE — Assessment & Plan Note (Signed)
Summary: EST-3 MONTH ROUTINE CHECKUP/CH   Vital Signs:  Patient profile:   65 year old female Height:      64 inches (162.56 cm) Weight:      186.5 pounds (83.59 kg) BMI:     31.68 Temp:     97.3 degrees F (36.28 degrees C) oral BP sitting:   134 / 77  (left arm) Cuff size:   regular  Vitals Entered By: Lucky Rathke NT II (February 07, 2010 10:41 AM) CC: FLU SHOT / MEDICATION REFILL / METER DOWNLOAD /  Is Patient Diabetic? Yes Did you bring your meter with you today? Yes Pain Assessment Patient in pain? no      Nutritional Status BMI of > 30 = obese CBG Result 128  Have you ever been in a relationship where you felt threatened, hurt or afraid?No   Does patient need assistance? Functional Status Self care Ambulation Normal   Primary Care Provider:  Niel Hummer MD  CC:  FLU SHOT / MEDICATION REFILL / METER DOWNLOAD / .  History of Present Illness: [name] is a 65 yo female that I am seeing in F/U. Please refer to the A/P for the HPI / status of the pts multiple chronic medical problems.   Preventive Screening-Counseling & Management  Alcohol-Tobacco     Alcohol drinks/day: 0     Smoking Status: never  Caffeine-Diet-Exercise     Does Patient Exercise: yes     Type of exercise: GYM     Exercise (avg: min/session): 1 1/2 HOUR     Times/week: 3  Medications Prior to Update: 1)  Plavix 75 Mg Tabs (Clopidogrel Bisulfate) .... Take 1 Tablet By Mouth Once A Day 2)  Welchol 625 Mg Tabs (Colesevelam Hcl) .... 3 Tablets Once Daily 3)  Atenolol 50 Mg Tabs (Atenolol) .... Take 1 Tablet By Mouth Two Times A Day 4)  Hydroxyzine Hcl 50 Mg Tabs (Hydroxyzine Hcl) .... Take 1 Tablet By Mouth Once A Day As Needed 5)  Nitrostat 0.4 Mg Subl (Nitroglycerin) .... As Needed 6)  Bayer Low Strength 81 Mg Tbec (Aspirin) .... Take 1 Tablet By Mouth Once A Day 7)  Norvasc 10 Mg  Tabs (Amlodipine Besylate) .... Take 1 Tablet By Mouth Daily. 8)  Cvs Iron 325 (65 Fe) Mg  Tabs  (Ferrous Sulfate) .... Take 1 Tablet By Mouth Three Times A Day 9)  Metformin Hcl 1000 Mg Tabs (Metformin Hcl) .... Take 1 Tablet By Mouth Twice A Day. 10)  1st Choice Lancets Thin   Misc (Lancets) 11)  Lipitor 20 Mg Tabs (Atorvastatin Calcium) .... Take 1 Tablet By Mouth Daily. 12)  Glucotrol 10 Mg Tabs (Glipizide) .... Take 2 Tablets in The Morning and 2 Tablet in The Evening By Mouth 13)  Truetrack Test   Strp (Glucose Blood) .... Use To Test Blood Sugar Twice Daily 14)  Lancets   Misc (Lancets) .... Use To Test Blood Glucose Twice Daily 15)  Cobal-1000 1000 Mcg/ml Soln (Cyanocobalamin) .... Inject Im Daily For 1 Week Then 69m Every Week For 4 Weeks. 16)  B-12 Monthly 17)  Lantus 100 Unit/ml Soln (Insulin Glargine) .... Inject 30  Units Subq At Bedtime 18)  Insulin Syringe 28g X 1/2" 1 Ml Misc (Insulin Syringe-Needle U-100) .... Use To Inject Insulin As Instructed. 19)  Quinapril Hcl 10 Mg Tabs (Quinapril Hcl) .... One Tablet By Mouth Once A Day 20)  Diabetic Tussin Dm 100-10 Mg/520mLiqd (Dextromethorphan-Guaifenesin) .... Take 1025my Mouth Every 4  Hours As Needed For Cough  Allergies: 1)  ! Valium (Diazepam) 2)  ! Morphine Sulfate (Morphine Sulfate) 3)  ! Haldol 4)  ! Darvon 5)  ! Ativan 6)  ! 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 Daily Caffeine Use -3 Illicit Drug Use - no  Past Medical History: CAD      1996 - PTCA and angioplasty diagonal branch      2000 - Rotoblator & angioplasty of diagonal      2006 - subendocardial AMI, DES to poximal LAD. Also had 90% stenosis in distal apical LAD. EF 55%.                    Apical hypokinesis      Indefinite ASA and Plavix  HTN      2006 B renal arteries patient      2003 MRA - no RAS      2003 - pheo W/U Dr Hassell Done reportedly negative  Chronic N / V /  D      Presumptive dx Crohn's dx per elevated P ANCA      Failed Entocort and Pentasa      Sep 2003 - Ileocolectomy c anastomosis Dr Deon Pilling 2/2 adhesions - path was NEGATIVE for Crohns       EGD, small-bowel follow-through (11/03), and an enteroclysis (10/03)  were unrevealing.            Causes hypo Mag and hypocalcemia  Anemia - multifactorial      Baseline HgB 10-11ish      B12 def - 150 in 3/10      Fe def - ferritin 35 3/10      Both are being repleted  CVA      Incidental finding MRI 2002 L lacunar infarct  DM II insulin dependent  Hyperlipidemia  Seasonal allergies  Chronic pain      CT 10/10 Spinal stenosis L2 - S1  CRI  Adnexal mass 8/03      s/p Laparoscopic BSO (R ovarian fibroma) & lysis of adhesion   Review of Systems General:  Complains of sleep disorder; denies fever. Eyes:  Complains of discharge; denies eye pain. ENT:  Complains of nasal congestion and postnasal drainage; denies difficulty swallowing. CV:  Denies chest pain or discomfort and shortness of breath with exertion. Resp:  Complains of cough and sputum productive; denies shortness of breath. GI:  Complains of diarrhea, nausea, and vomiting; denies bloody stools. GU:  Complains of nocturia; denies urinary frequency. Katie:  Complains of joint pain; denies joint swelling. Derm:  Denies lesion(s) and rash. Endo:  Denies excessive urination and polyuria. Heme:  Denies abnormal bruising and bleeding. Allergy:  Complains of itching eyes and sneezing.  Physical Exam  General:  alert, well-developed, and well-nourished.   Head:  normocephalic and atraumatic.   Eyes:  vision grossly intact, pupils equal, and pupils round.   Ears:  R ear normal and L ear normal.   Nose:  no external deformity, no external erythema, and no nasal discharge.   Neck:  supple, no masses, and no carotid bruits.   Lungs:  normal respiratory effort, no accessory muscle use, and normal breath sounds.   Heart:  normal rate,  regular rhythm, no murmur, no gallop, and no rub.   Msk:  normal ROM, no joint tenderness, no  joint swelling, no joint warmth, no redness over joints, and no crepitation.   Extremities:  trace left pedal edema and trace right pedal edema.   Neurologic:  alert & oriented X3 and gait normal.   Skin:  turgor normal, color normal, and no rashes.   Cervical Nodes:  no anterior cervical adenopathy.   Psych:  memory intact for recent and remote, normally interactive, good eye contact, not anxious appearing, and not depressed appearing.     Impression & Recommendations:  Problem # 1:  CAD (ICD-414.00) Assessment Unchanged  She sees Dr Johnsie Cancel. She is asymptomatic currently. She is on life long Plavix and ASA I believe due to the proximal nature of her stenosis and stent. Her risk factors are appropriately being managed.  The following medications were removed from the medication list:    Quinapril Hcl 10 Mg Tabs (Quinapril hcl) ..... One tablet by mouth once a day Her updated medication list for this problem includes:    Plavix 75 Mg Tabs (Clopidogrel bisulfate) .Marland Kitchen... Take 1 tablet by mouth once a day    Atenolol 50 Mg Tabs (Atenolol) .Marland Kitchen... Take 1 tablet by mouth two times a day    Nitrostat 0.4 Mg Subl (Nitroglycerin) .Marland Kitchen... As needed    Bayer Low Strength 81 Mg Tbec (Aspirin) .Marland Kitchen... Take 1 tablet by mouth once a day    Norvasc 10 Mg Tabs (Amlodipine besylate) .Marland Kitchen... Take 1 tablet by mouth daily.    Accupril 10 Mg Tabs (Quinapril hcl) ..... One by mouth once daily for high blood pressure and to protect your kidneys  Problem # 2:  HYPERTENSION (ICD-401.9) Assessment: Unchanged Fair BP control today. She has been out of her ACEI for at least a month so once she gets that restarted then her BP may be better. She will get a BMP next week at a lab visit.  When I started the ACEI, she was to have D/C'd her clonidine but it appears that she didn't and her BL will be able to tolerate all the meds.  The  following medications were removed from the medication list:    Quinapril Hcl 10 Mg Tabs (Quinapril hcl) ..... One tablet by mouth once a day Her updated medication list for this problem includes:    Atenolol 50 Mg Tabs (Atenolol) .Marland Kitchen... Take 1 tablet by mouth two times a day    Norvasc 10 Mg Tabs (Amlodipine besylate) .Marland Kitchen... Take 1 tablet by mouth daily.    Accupril 10 Mg Tabs (Quinapril hcl) ..... One by mouth once daily for high blood pressure and to protect your kidneys  Orders: T-Basic Metabolic Panel ([id number])  Problem # 3:  Sx complex of N/V/D Assessment: Unchanged This seems to be 2/2 her extensive G/I hx and sugeries. This has been chronic but intermittent. When it occurs, it lasts a day or two. She takes immodium. Used to take anti-emtic as needed but doesn't have it anymore. She does see [facility] about once yearly. She has not had to be hospitalized recently for this. There has been no change in these sxs since the last visit.  Problem # 4:  ANEMIA, B12 DEFICIENCY (ICD-281.1) She has a multifactorial anemia - both documented Vit B12 and iron def. I will repeat her CBC when she returns to her lab visit as her HgB had deccreased and also check the ferritin to make certain that it is being repleted.  She doesn't have an ileum so oral B 12 is not an option for her.  She is coming for monthly B12 injections.  Her updated medication list for this problem includes:    Cvs Iron 325 (65 Fe) Mg Tabs (Ferrous sulfate) .Marland Kitchen... Take 1 tablet by mouth three times a day    Cobal-1000 1000 Mcg/ml Soln (Cyanocobalamin) ..... Inject im daily for 1 week then 51m every week for 4 weeks.  Orders: Vit B12 1000 mcg (J3420) Admin of Therapeutic Inj  intramuscular or subcutaneous (([id number]  Problem # 5:  CVA (ICD-434.91) Assessment: Unchanged She has no residual sxs even though an infarct was seen on MRI. She cont on ASA and plavix. Risk factors are being managed.  Her updated medication list for this  problem includes:    Plavix 75 Mg Tabs (Clopidogrel bisulfate) ..Marland Kitchen.. Take 1 tablet by mouth once a day    Bayer Low Strength 81 Mg Tbec (Aspirin) ..Marland Kitchen.. Take 1 tablet by mouth once a day  Problem # 6:  DM, UNCOMPLICATED, TYPE II (IIPJ-825.05 Assessment: Improved She is on Metformin, glucotrol, and lantus. She has never taken any prandial insulin. Her A1C 8/11 came back at 7.4. Today's is 6.3. She has excellent control of her glucose.   She normally checks her CBGs once fasting in the AM and after dinner. She got a new meter that is now accurate. She brought her meter and the download will be scanned into the EMR. Most AM CBG's are controlled although most evenings are in the 200 range. If her A1C trends up, maybe a pre dinner insulin would suffice.  Eye exam was 3/11 and FLP 6/11 and was controlled.  She has diabetic nephropathy - elevated microalb in 2007, 2009, 2011. She is now back on an ACEI. She had a 16% drop in her creatitine after starting the ACEI and her cr is almost at the cut off for metformin. I really do not want to D/C the metformin bc she is doing so well. Will recheck her BMP next week and see where her cr settled. I stressed importance of avoiding NSAIDS.  The following medications were removed from the medication list:    Quinapril Hcl 10 Mg Tabs (Quinapril hcl) ..... One tablet by mouth once a day Her updated medication list for this problem includes:    Bayer Low Strength 81 Mg Tbec (Aspirin) ..Marland Kitchen.. Take 1 tablet by mouth once a day    Metformin Hcl 1000 Mg Tabs (Metformin hcl) ..Marland Kitchen.. Take 1 tablet by mouth twice a day.    Glucotrol 10 Mg Tabs (Glipizide) ..Marland Kitchen.. Take 2 tablets in the morning and 2 tablet in the evening by mouth    Lantus 100 Unit/ml Soln (Insulin glargine) ..... Inject 30  units subq at bedtime    Accupril 10 Mg Tabs (Quinapril hcl) ..... One by mouth once daily for high blood pressure and to protect your kidneys  Orders: T- Capillary Blood Glucose  ([id number]) T-Hgb A1C (in-house) (([id number]  Problem # 7:  HYPERCHOLESTEROLEMIA (ICD-272.0) Assessment: Unchanged Her FLP was checked 3/11 and her LDL was 70 but her trig were in the 300's. Pt will return next week for FLP. LFT's were 6/11 and were OK.  Her updated medication list for this problem includes:    Welchol 625 Mg Tabs (Colesevelam hcl) ..Marland KitchenMarland KitchenMarland KitchenMarland Kitchen3 tablets once daily    Lipitor 20 Mg Tabs (Atorvastatin calcium) ..Marland Kitchen.. Take 1 tablet by mouth daily.  Problem # 8:  ALLERGIC RHINITIS (ICD-477.9) She has been on allegra for years and she does not feel that it is working anymore.  Has had good response to claritin in the past so will retry.  Her updated medication list for this problem includes:    Loratadine 10 Mg Tabs (Loratadine) .Marland Kitchen... Take one pill once daily for allergies  Problem # 9:  KNEE PAIN, LEFT, ACUTE (ICD-719.46) Assessment: New This started three weeks ago. She has no pain throughout the day but as soon as she gets into bed the pain starts. Moving around the leg doesn't help. She has found nothing that does. Putting weight on it during day doesn;t make it worse. There was no injury. She had similiar pain years ago and it went away without intervention. She tried NSAID and no better. I stressed to D/C the NSAID and to try tylenol. I don't really know what this is bc doesn't fit any pattern - not OA, RLS, Baker's cyst, DVT. If no better start maybe with plan film?  Problem # 10:  Preventive Health Care (ICD-V70.0)  Colon 06/29/08 and OK. Repeat in 10 yrs. MMG 9/11 and O Pap all nl, never had precancerous changes. Doesn't want anymore Pap's. Pneumovax - 06/02/09 Flu today 02/07/10  Complete Medication List: 1)  Plavix 75 Mg Tabs (Clopidogrel bisulfate) .... Take 1 tablet by mouth once a day 2)  Welchol 625 Mg Tabs (Colesevelam hcl) .... 3 tablets once daily 3)  Atenolol 50 Mg Tabs (Atenolol) .... Take 1 tablet by mouth two times a day 4)  Hydroxyzine Hcl 50 Mg Tabs (Hydroxyzine  hcl) .... Take 1 tablet by mouth at bedtime as neede for sleep 5)  Nitrostat 0.4 Mg Subl (Nitroglycerin) .... As needed 6)  Bayer Low Strength 81 Mg Tbec (Aspirin) .... Take 1 tablet by mouth once a day 7)  Norvasc 10 Mg Tabs (Amlodipine besylate) .... Take 1 tablet by mouth daily. 8)  Cvs Iron 325 (65 Fe) Mg Tabs (Ferrous sulfate) .... Take 1 tablet by mouth three times a day 9)  Metformin Hcl 1000 Mg Tabs (Metformin hcl) .... Take 1 tablet by mouth twice a day. 10)  1st Choice Lancets Thin Misc (Lancets) 11)  Lipitor 20 Mg Tabs (Atorvastatin calcium) .... Take 1 tablet by mouth daily. 12)  Glucotrol 10 Mg Tabs (Glipizide) .... Take 2 tablets in the morning and 2 tablet in the evening by mouth 13)  Truetrack Test Strp (Glucose blood) .... Use to test blood sugar twice daily 14)  Lancets Misc (Lancets) .... Use to test blood glucose twice daily 15)  Cobal-1000 1000 Mcg/ml Soln (Cyanocobalamin) .... Inject im daily for 1 week then 67m every week for 4 weeks. 16)  B-12 Monthly  17)  Lantus 100 Unit/ml Soln (Insulin glargine) .... Inject 30  units subq at bedtime 18)  Insulin Syringe 28g X 1/2" 1 Ml Misc (Insulin syringe-needle u-100) .... Use to inject insulin as instructed. 19)  Diabetic Tussin Dm 100-10 Mg/535mLiqd (Dextromethorphan-guaifenesin) .... Take 1040my mouth every 4 hours as needed for cough 20)  Accupril 10 Mg Tabs (Quinapril hcl) .... One by mouth once daily for high blood pressure and to protect your kidneys 21)  Loratadine 10 Mg Tabs (Loratadine) .... Take one pill once daily for allergies  Other Orders: T-Lipid Profile ([id number]-CBC No Diff ([id number]-Ferritin ([id number] Vaccine NON MCR ([id number] Instructions: 1)  Please make an appt in 3 months to see me.  2)  I will send you a copy of your lab results Prescriptions: HYDROXYZINE HCL 50 MG TABS (HYDROXYZINE HCL) Take 1 tablet by mouth at  bedtime as neede for sleep  #31 x 5   Entered and  Authorized by:   Larey Dresser MD   Signed by:   Larey Dresser MD on 02/07/2010   Method used:   Faxed to ...       [facility] (retail)       [place], [place]  [address]       Ph: [phone]       Fax: [phone]   RxID:   [id number] LORATADINE 10 MG TABS (LORATADINE) Take one pill once daily for allergies  #31 x 11   Entered and Authorized by:   Larey Dresser MD   Signed by:   Larey Dresser MD on 02/07/2010   Method used:   Faxed to ...       [facility] (retail)       [place], [place]  [address]       Ph: [phone]       Fax: [phone]   RxID:   [id number] ACCUPRIL 10 MG TABS (QUINAPRIL HCL) one by mouth once daily for high blood pressure and to protect your kidneys  #31 x 5   Entered and Authorized by:   Larey Dresser MD   Signed by:   Larey Dresser MD on 02/07/2010   Method used:   Faxed to ...       [facility] (retail)       [place], [place]  [address]       Ph: [phone]       Fax: [phone]   RxID:   [id number]    Medication Administration  Injection # 1:    Medication: Vit B12 1000 mcg    Diagnosis: ANEMIA, B12 DEFICIENCY (ICD-281.1)    Route: IM    Site: L deltoid    Exp Date: 10/2011    Lot #: [id number]    Mfr: [place]    Patient tolerated injection without complications    Given by: Morrison Old RN (February 07, 2010 11:42 AM)  Orders Added: 1)  T- Capillary Blood Glucose [82948] 2)  T-Hgb A1C (in-house) [[id number]] 3)  T-Lipid Profile [[id number]] 4)  T-Basic Metabolic Panel [[id number]] 5)  Vit B12 1000 mcg [J3420] 6)  T-CBC No Diff [[id number]] 7)  T-Ferritin [[id number]] 8)  Influenza Vaccine NON MCR [00028] 9)  Admin of Therapeutic Inj  intramuscular or subcutaneous [96372] 10)  Est. Patient Level IV [70929]   Immunizations  Administered:  Influenza Vaccine # 1:    Vaccine Type: Fluvax Non-MCR    Site: right deltoid    Mfr: GlaxoSmithKline    Dose: 0.5 ml    Route: IM    Given by: Morrison Old RN    Exp. Date: 09/16/2010    Lot #: [id number]    VIS given: 10/11/09 version given February 07, 2010.  Flu Vaccine Consent Questions:    Do you have a history of severe allergic reactions to this vaccine? no    Any prior history of allergic reactions to egg and/or gelatin? no    Do you have a sensitivity to the preservative Thimersol? no    Do you have a past history of Guillan-Barre Syndrome? no    Do you currently have an acute febrile illness? no    Have you ever had a severe reaction  to latex? no    Vaccine information given and explained to patient? yes    Are you currently pregnant? no   Immunizations Administered:  Influenza Vaccine # 1:    Vaccine Type: Fluvax Non-MCR    Site: right deltoid    Mfr: GlaxoSmithKline    Dose: 0.5 ml    Route: IM    Given by: Morrison Old RN    Exp. Date: 09/16/2010    Lot #: [id number]    VIS given: 10/11/09 version given February 07, 2010. Process Orders Check Orders Results:     [facility]: ABN not required for this insurance Tests Sent for requisitioning (February 07, 2010 7:07 PM):     02/07/2010: [facility] -- T-Lipid Profile [phone] (signed)     02/07/2010: [facility] -- T-Basic Metabolic Panel [[id number]] (signed)     02/07/2010: [facility] -- T-CBC No Diff [[id number]] (signed)     02/07/2010: [facility] -- T-Ferritin [[id number]] (signed)     Prevention & Chronic Care Immunizations   Influenza vaccine: Fluvax Non-MCR  (02/07/2010)   Influenza vaccine deferral: Deferred  (10/25/2009)   Influenza vaccine due: 11/18/2010    Tetanus booster: 04/28/2009: Td    Pneumococcal vaccine: Pneumovax  (06/02/2009)    H. zoster vaccine: Not documented   H.  zoster vaccine deferral: Deferred  (04/28/2009)  Colorectal Screening   Hemoccult: Not documented   Hemoccult action/deferral: Not indicated  (10/25/2009)    Colonoscopy: 1) Prior right hemi-colectomy 2) Normal in the distal  ileum, biopsied. NORMAL 3) Normal colon, biopsied. NORMAL 4) Normal in the rectum  (06/29/2008)   Colonoscopy action/deferral: Repeat colonoscopy in 10 years.    (06/29/2008)   Colonoscopy due: 06/30/2018  Other Screening   Pap smear: Not documented   Pap smear action/deferral: Refused  (02/07/2010)    Mammogram: ASSESSMENT: Negative - BI-RADS 1^MM DIGITAL SCREENING  (11/17/2009)   Mammogram action/deferral: Ordered  (10/25/2009)   Mammogram due: 11/06/2008    DXA bone density scan: Not documented   DXA bone density action/deferral: Ordered  (11/05/2008)   DXA scan due: 11/06/2008    Smoking status: never  (02/07/2010)  Diabetes Mellitus   HgbA1C: 6.3  (02/07/2010)   HgbA1C action/deferral: Ordered  (10/25/2009)   Hemoglobin A1C due: 02/05/2009    Eye exam: No diabetic retinopathy.   Htn changes   (06/08/2009)   Diabetic eye exam action/deferral: Ophthalmology referral  (04/28/2009)   Eye exam due: 06/2010    Foot exam: yes  (01/03/2010)   Foot exam action/deferral: Do today   High risk foot: No  (01/03/2010)   Foot care education: Done  (06/02/2009)   Foot exam due: 12/22/2008    Urine microalbumin/creatinine ratio: 52.4  (04/11/2009)    Diabetes flowsheet reviewed?: Yes   Progress toward A1C goal: At goal    Stage of readiness to change (diabetes management): Maintenance  Lipids   Total Cholesterol: 177  (08/29/2009)   Lipid panel action/deferral: Lipid Panel ordered   LDL: 70  (08/29/2009)   LDL Direct: Not documented   HDL: 33  (08/29/2009)   Triglycerides: 368  (08/29/2009)    SGOT (AST): 13  (08/29/2009)   BMP action: Ordered   SGPT (ALT): 12  (08/29/2009)   Alkaline phosphatase: 110  (08/29/2009)   Total bilirubin: 0.4   (08/29/2009)    Lipid flowsheet reviewed?: Yes   Progress toward LDL goal: At goal    Stage of readiness to change (lipid management): Action  Hypertension   Last Blood Pressure: 134 / 77  (02/07/2010)   Serum creatinine: 1.47  (01/03/2010)   BMP action: Ordered   Serum potassium 4.4  (01/03/2010)    Hypertension flowsheet reviewed?: Yes   Progress toward BP goal: Unchanged    Stage of readiness to change (hypertension management): Action  Self-Management Support :   Personal Goals (by the next clinic visit) :     Personal A1C goal: 7  (04/28/2009)     Personal blood pressure goal: 130/80  (04/28/2009)     Personal LDL goal: 100  (04/28/2009)    Patient will work on the following items until the next clinic visit to reach self-care goals:     Medications and monitoring: take my medicines every day, check my blood sugar, bring all of my medications to every visit, examine my feet every day  (02/07/2010)     Eating: drink diet soda or water instead of juice or soda, eat more vegetables, use fresh or frozen vegetables, eat foods that are low in salt, eat baked foods instead of fried foods, eat fruit for snacks and desserts, limit or avoid alcohol  (02/07/2010)     Activity: take a 30 minute walk every day  (02/07/2010)     Other: walks a lot at work  (11/05/2008)    Diabetes self-management support: Referred for DM self-management training, Resources for patients handout  (02/07/2010)   Last diabetes self-management training by diabetes educator: 11/05/2007    Hypertension self-management support: Resources for patients handout  (02/07/2010)    Lipid self-management support: Resources for patients handout  (02/07/2010)        Resource handout printed.   Nursing Instructions: Refer for diabetes self-management training (see order) Give Flu vaccine today    Diabetes Self Management Training Referral Patient Name: [name] Date Of Birth: Apr 10, 1945 MRN:  [id number] Current Diagnosis:  KNEE PAIN, LEFT, ACUTE (ICD-719.46) ANEMIA IN CHRONIC KIDNEY DISEASE (ICD-285.21) ALLERGIC RHINITIS (ICD-477.9) CVA (ICD-434.91) HYPERTENSION (ICD-401.9) CAD (ICD-414.00) GERD (ICD-530.81) HYPERCHOLESTEROLEMIA (ICD-272.0) RENAL INSUFFICIENCY (ICD-588.9) HYPOMAGNESEMIA (ICD-275.2) HYPOKALEMIA (ICD-276.8) SPINAL STENOSIS, LUMBAR (ICD-724.02) ANEMIA, B12 DEFICIENCY (ZES-923.3) DM, UNCOMPLICATED, TYPE II (AQT-622.63) HYPERLIPIDEMIA (ICD-272.4) DIARRHEA, CHRONIC (ICD-787.91) PREVENTIVE HEALTH CARE (ICD-V70.0) FAMILY HSITORY BREAST CANCER 1ST DEGREE RELATIVE <50 (ICD-V16.3) OBESITY, MORBID (ICD-278.01) HIATAL HERNIA WITH REFLUX (ICD-553.3)   Complicating Conditions:    Last LDL:                                                 70 (08/29/2009 7:54:00 PM)          Diabetic Foot Exam    10-g (5.07) Semmes-Weinstein Monofilament Test Performed by: Lucky Rathke NT II          Right Foot          Left Foot Visual Inspection     normal           normal   Laboratory Results   Blood Tests   Date/Time Received: February 07, 2010 11:02 AM  Date/Time Reported: Lenoria Farrier  February 07, 2010 11:02 AM   HGBA1C: 6.3%   (Normal Range: Non-Diabetic - 3-6%   Control Diabetic - 6-8%) CBG Random:: 113m/dL     Process Orders Check Orders Results:     [facility]: AFHLnot required for this insurance Tests Sent for requisitioning (February 07, 2010 7:07  PM):     02/07/2010: [facility] -- T-Lipid Profile [id number] (signed)     02/07/2010: [facility] -- T-Basic Metabolic Panel [[id number]] (signed)     02/07/2010: [facility] -- T-CBC No Diff [[id number]] (signed)     02/07/2010: [facility] -- T-Ferritin [phone] (signed)         Medication Administration  Injection # 1:    Medication: Vit B12 1000 mcg    Diagnosis: ANEMIA, B12 DEFICIENCY  (ICD-281.1)    Route: IM    Site: L deltoid    Exp Date: 10/2011    Lot #: [id number]    Mfr: [place]    Patient tolerated injection without complications    Given by: Morrison Old RN (February 07, 2010 11:42 AM)  Orders Added: 1)  T- Capillary Blood Glucose [82948] 2)  T-Hgb A1C (in-house) [83036QW] 3)  T-Lipid Profile [[id number]] 4)  T-Basic Metabolic Panel [[id number]] 5)  Vit B12 1000 mcg [J3420] 6)  T-CBC No Diff [[id number]] 7)  T-Ferritin [[id number]] 8)  Influenza Vaccine NON MCR [00028] 9)  Admin of Therapeutic Inj  intramuscular or subcutaneous [96372] 10)  Est. Patient Level IV [60156]

## 2010-04-18 NOTE — Assessment & Plan Note (Signed)
Summary: 10   Vital Signs:  Patient profile:   65 year old female Height:      64 inches Weight:      190.3 pounds BMI:     32.78 Temp:     97.9 degrees F Pulse rate:   77 / minute BP sitting:   122 / 69  (right arm)  Vitals Entered By: Silverio Decamp NT II (April 28, 2009 3:28 PM) CC: FOLLow-up visit FOR DIABETES Is Patient Diabetic? Yes Did you bring your meter with you today? Yes Pain Assessment Patient in pain? no      Nutritional Status BMI of > 30 = obese  Have you ever been in a relationship where you felt threatened, hurt or afraid?No   Does patient need assistance? Functional Status Self care Ambulation Normal   Primary Care Provider:  Niel Hummer MD  CC:  FOLLow-up visit FOR DIABETES.  History of Present Illness: 65 y/o woman with PMH of HTN, DM, HLD comes for a blood sugar recheck after she was started on lantus 12 u 2 weeks back  She has been complant with lantus. She has been able to inject herself without problem. she met Barnabas Harries on 04/20/09 for DM education. She had been asked to increase her metformin to 2 tab two times a day but she had been taking it 1 two times a day as she was not aware of the change.   Preventive Screening-Counseling & Management  Alcohol-Tobacco     Alcohol drinks/day: 0     Smoking Status: never  Caffeine-Diet-Exercise     Does Patient Exercise: no  Allergies: 1)  ! Valium (Diazepam) 2)  ! Morphine Sulfate (Morphine Sulfate) 3)  ! Haldol 4)  ! Darvon 5)  ! Ativan 6)  ! Tramadol Hcl  Review of Systems  The patient denies anorexia, fever, weight loss, weight gain, vision loss, decreased hearing, hoarseness, chest pain, syncope, dyspnea on exertion, peripheral edema, prolonged cough, headaches, hemoptysis, abdominal pain, melena, hematochezia, severe indigestion/heartburn, hematuria, incontinence, genital sores, muscle weakness, suspicious skin lesions, transient blindness, difficulty walking, depression, unusual  weight change, abnormal bleeding, enlarged lymph nodes, angioedema, breast masses, and testicular masses.    Physical Exam  General:  alert and well-developed.   Head:  normocephalic and atraumatic.   Eyes:  vision grossly intact, pupils equal, pupils round, and pupils reactive to light.   Ears:  R ear normal and L ear normal.   Nose:  no external deformity.   Neck:  supple and full ROM.   Lungs:  normal respiratory effort, no intercostal retractions, no dullness, and no wheezes.   Heart:  normal rate, regular rhythm, no murmur, and no JVD.   Abdomen:  soft, non-tender, and normal bowel sounds.   Msk:  normal ROM, no joint tenderness, no joint swelling, and no joint warmth.   Pulses:  dorsalis pedis pulses normal bilaterally  Extremities:  no edema Neurologic:  OrientedX3, cranial nerver 2-12 intact,strength good in all extremities, sensations normal to light touch, reflexes 2+ b/l, gait normal    Impression & Recommendations:  Problem # 1:  DM, UNCOMPLICATED, TYPE II (PTW-656.81) Her blood sugars have been running to 300-400 throughout the day with a little drop by the end of the day. Will increase lantus dose to 17 units today and ask her to self titrate it to 20 u after 3 days if theblood sugars are >200. Will also ask to increase the number of metformin pills she takes to 500  2 tabs two times a day. She sounds understanding of the plan. [name] is also going to talk to her in a week. She does not have any hypoglycemic episodes. repeat appointment in 1 month  Her updated medication list for this problem includes:    Bayer Low Strength 81 Mg Tbec (Aspirin) .Marland Kitchen... Take 1 tablet by mouth once a day    Metformin Hcl 500 Mg Tb24 (Metformin hcl) .Marland Kitchen... Take 2  tablet twice a day.    Glucotrol 10 Mg Tabs (Glipizide) .Marland Kitchen... Take 2 tablets in the morning and 1 tablet in the evening by mouth    Lantus 100 Unit/ml Soln (Insulin glargine) ..... Inject 17 units subq at bedtime. increase it to 20 units  after 3 days if your blood sugars are still above 200    Actos 15 Mg Tabs (Pioglitazone hcl) .Marland Kitchen... Take 1 tablet by mouth twice a day.  Labs Reviewed: Creat: 1.32 (04/11/2009)     Last Eye Exam: No diabetic retinopathy.   Vascular changes consistent with hypertension, venous tortuosity (01/27/2008) Reviewed HgBA1c results: 13.1 (04/11/2009)  8.0 (12/16/2008)  Problem # 2:  HYPERTENSION (ICD-401.9) Well controlled. No change  Her updated medication list for this problem includes:    Catapres 0.2 Mg Tabs (Clonidine hcl) .Marland Kitchen... Take 1 tablet by mouth three times a day    Atenolol 50 Mg Tabs (Atenolol) .Marland Kitchen... Take 1 tablet by mouth two times a day    Norvasc 10 Mg Tabs (Amlodipine besylate) .Marland Kitchen... Take 1 tablet by mouth daily.  BP today: 122/69 Prior BP: 132/76 (04/11/2009)  Labs Reviewed: K+: 4.2 (04/11/2009) Creat: : 1.32 (04/11/2009)   Chol: 168 (05/05/2008)   HDL: 36 (05/05/2008)   LDL: 64 (05/05/2008)   TG: 342 (05/05/2008)  Problem # 3:  HYPERCHOLESTEROLEMIA (ICD-272.0) She needs lipi profile but she is not fasting.  Her updated medication list for this problem includes:    Welchol 625 Mg Tabs (Colesevelam hcl) .Marland KitchenMarland KitchenMarland KitchenMarland Kitchen 3 tablets once daily    Lipitor 20 Mg Tabs (Atorvastatin calcium) .Marland Kitchen... Take 1 tablet by mouth daily.  Labs Reviewed: SGOT: 15 (05/05/2008)   SGPT: 15 (05/05/2008)   HDL:36 (05/05/2008), 44 (11/12/2007)  LDL:64 (05/05/2008), 72 (11/12/2007)  Chol:168 (05/05/2008), 188 (11/12/2007)  Trig:342 (05/05/2008), 358 (11/12/2007)  Problem # 4:  ANEMIA, B12 DEFICIENCY (ICD-281.1) B12 inj today.  Her updated medication list for this problem includes:    Cvs Iron 325 (65 Fe) Mg Tabs (Ferrous sulfate) .Marland Kitchen... Take 1 tablet by mouth three times a day    Cobal-1000 1000 Mcg/ml Soln (Cyanocobalamin) ..... Inject im daily for 1 week then 28m every week for 4 weeks.    Vitamin B-12 250 Mcg Tabs (Cyanocobalamin) ..Marland Kitchen.. Take 1 tablet by mouth daily.  Orders: Admin of Therapeutic Inj   intramuscular or subcutaneous (([id number] Vit B12 1000 mcg (J3420)  Hgb: 10.9 (04/11/2009)   Hct: 31.5 (04/11/2009)   Platelets: 246 (04/11/2009) RBC: 3.85 (04/11/2009)   RDW: 15.4 (04/11/2009)   WBC: 7.6 (04/11/2009) MCV: 81.9 (04/11/2009)   MCHC: 34.4 (04/11/2009) Ferritin: 52 (11/09/2008) Iron: 60 (06/04/2008)   TIBC: 383 (12/27/2006)   % Sat: 12.6 (06/04/2008) B12: 524 (12/02/2008)   Folate: 9.9 (06/04/2008)   TSH: 1.609 (05/05/2008)  Complete Medication List: 1)  Plavix 75 Mg Tabs (Clopidogrel bisulfate) .... Take 1 tablet by mouth once a day 2)  Catapres 0.2 Mg Tabs (Clonidine hcl) .... Take 1 tablet by mouth three times a day 3)  Welchol 625 Mg Tabs (Colesevelam hcl) .... 3 tablets  once daily 4)  Atenolol 50 Mg Tabs (Atenolol) .... Take 1 tablet by mouth two times a day 5)  Fexofenadine Hcl 180 Mg Tabs (Fexofenadine hcl) .... Take 1 tablet by mouth once a day as needed 6)  Hydroxyzine Hcl 50 Mg Tabs (Hydroxyzine hcl) .... Take 1 tablet by mouth once a day as needed 7)  Nitrostat 0.4 Mg Subl (Nitroglycerin) .... As needed 8)  Bayer Low Strength 81 Mg Tbec (Aspirin) .... Take 1 tablet by mouth once a day 9)  Norvasc 10 Mg Tabs (Amlodipine besylate) .... Take 1 tablet by mouth daily. 10)  Cvs Iron 325 (65 Fe) Mg Tabs (Ferrous sulfate) .... Take 1 tablet by mouth three times a day 11)  Metformin Hcl 500 Mg Tb24 (Metformin hcl) .... Take 2  tablet twice a day. 12)  1st Choice Lancets Thin Misc (Lancets) 13)  Lipitor 20 Mg Tabs (Atorvastatin calcium) .... Take 1 tablet by mouth daily. 14)  Glucotrol 10 Mg Tabs (Glipizide) .... Take 2 tablets in the morning and 1 tablet in the evening by mouth 15)  [place] Test Strp (Glucose blood) .... Use to test blood sugar twice daily 16)  Lancets Misc (Lancets) .... Use to test blood glucose twice daily 17)  Cobal-1000 1000 Mcg/ml Soln (Cyanocobalamin) .... Inject im daily for 1 week then 52m every week for 4 weeks. 18)  Vicodin 5-500 Mg Tabs  (Hydrocodone-acetaminophen) .... Take 1 tablet by mouth two times a day as needed for pain 19)  Promethazine Hcl 25 Mg Tabs (Promethazine hcl) .... Take 1 tablet by mouth prn 20)  B-12 Monthly  21)  Lantus 100 Unit/ml Soln (Insulin glargine) .... Inject 17 units subq at bedtime. increase it to 20 units after 3 days if your blood sugars are still above 200 22)  Insulin Syringe 28g X 1/2" 1 Ml Misc (Insulin syringe-needle u-100) .... Use to inject insulin as instructed. 23)  Vitamin B-12 250 Mcg Tabs (Cyanocobalamin) .... Take 1 tablet by mouth daily. 24)  Actos 15 Mg Tabs (Pioglitazone hcl) .... Take 1 tablet by mouth twice a day.  Other Orders: Ophthalmology Referral (Ophthalmology) TD Toxoids IM 7 YR + [phone] Admin 1st Vaccine [phone]  Patient Instructions: 1)  Please schedule a follow-up appointment in 1 month. Come fasting at that time 2)  Call [place] you feel weak, sweaty,shivery or for any other concerns 3)  It is important that you exercise regularly at least 20 minutes 5 times a week. If you develop chest pain, have severe difficulty breathing, or feel very tired , stop exercising immediately and seek medical attention. 4)  You need to lose weight. Consider a lower calorie diet and regular exercise.    Prevention & Chronic Care Immunizations   Influenza vaccine: Fluvax 3+  (01/06/2009)   Influenza vaccine due: 11/18/2010    Tetanus booster: 04/28/2009: Td    Pneumococcal vaccine: Not documented    H. zoster vaccine: Not documented   H. zoster vaccine deferral: Deferred  (04/28/2009)  Colorectal Screening   Hemoccult: Not documented   Hemoccult action/deferral: Deferred  (04/28/2009)    Colonoscopy: 1) Prior right hemi-colectomy 2) Normal in the distal  ileum, biopsied. NORMAL 3) Normal colon, biopsied. NORMAL 4) Normal in the rectum  (06/29/2008)   Colonoscopy action/deferral: Repeat colonoscopy in 10 years.    (06/29/2008)   Colonoscopy due: 06/30/2018  Other  Screening   Pap smear: Not documented   Pap smear action/deferral: Deferred  (04/28/2009)    Mammogram: ASSESSMENT:  Negative - BI-RADS 1^MM DIGITAL SCREENING  (11/16/2008)   Mammogram action/deferral: Ordered  (11/05/2008)   Mammogram due: 11/06/2008    DXA bone density scan: Not documented   DXA bone density action/deferral: Ordered  (11/05/2008)   DXA scan due: 11/06/2008    Smoking status: never  (04/28/2009)  Diabetes Mellitus   HgbA1C: 13.1  (04/11/2009)   Hemoglobin A1C due: 02/05/2009    Eye exam: No diabetic retinopathy.   Vascular changes consistent with hypertension, venous tortuosity  (01/27/2008)   Diabetic eye exam action/deferral: Ophthalmology referral  (04/28/2009)   Eye exam due: 01/26/2009    Foot exam: yes  (09/21/2008)   Foot exam action/deferral: Do today   High risk foot: Not documented   Foot care education: Not documented   Foot exam due: 12/22/2008    Urine microalbumin/creatinine ratio: 52.4  (04/11/2009)    Diabetes flowsheet reviewed?: Yes   Progress toward A1C goal: Deteriorated  Lipids   Total Cholesterol: 168  (05/05/2008)   LDL: 64  (05/05/2008)   LDL Direct: Not documented   HDL: 36  (05/05/2008)   Triglycerides: 342  (05/05/2008)    SGOT (AST): 15  (05/05/2008)   SGPT (ALT): 15  (05/05/2008)   Alkaline phosphatase: 88  (05/05/2008)   Total bilirubin: 0.3  (05/05/2008)    Lipid flowsheet reviewed?: Yes   Progress toward LDL goal: At goal  Hypertension   Last Blood Pressure: 122 / 69  (04/28/2009)   Serum creatinine: 1.32  (04/11/2009)   Serum potassium 4.2  (04/11/2009)    Hypertension flowsheet reviewed?: Yes   Progress toward BP goal: Improved  Self-Management Support :   Personal Goals (by the next clinic visit) :     Personal A1C goal: 7  (04/28/2009)     Personal blood pressure goal: 130/80  (04/28/2009)     Personal LDL goal: 100  (04/28/2009)    Diabetes self-management support: Written self-care plan   (04/28/2009)   Diabetes care plan printed   Last diabetes self-management training by diabetes educator: 11/05/2007    Hypertension self-management support: Written self-care plan  (04/28/2009)   Hypertension self-care plan printed.    Lipid self-management support: Written self-care plan  (04/28/2009)   Lipid self-care plan printed.   Nursing Instructions: Give tetanus booster today Refer for screening diabetic eye exam (see order)     Immunizations Administered:  Tetanus Vaccine:    Vaccine Type: Td    Site: right deltoid    Mfr: [place]    Dose: 0.5 ml    Route: IM    Given by: [place], RN    Exp. Date: 03/24/2011    Lot #: [id number]    VIS given: 02/04/07 version given April 28, 2009.    Medication Administration  Injection # 1:    Medication: Vit B12 1000 mcg    Diagnosis: ANEMIA, B12 DEFICIENCY (ICD-281.1)    Route: IM    Site: L deltoid    Exp Date: 12/2010    Lot #: [id number]    Mfr: [place]    Patient tolerated injection without complications    Given by: Rexene Agent [place] (April 28, 2009 4:22 PM)/Gladys Herbin, RN

## 2010-04-18 NOTE — Letter (Signed)
Summary: DIABETIC EYE EXAMINATION REPORT  DIABETIC EYE EXAMINATION REPORT   Imported By: Garlan Fillers 06/23/2009 11:06:23  _____________________________________________________________________  External Attachment:    Type:   Image     Comment:   External Document  Appended Document: DIABETIC EYE EXAMINATION REPORT   Diabetic Eye Exam  Procedure date:  06/08/2009  Findings:      No diabetic retinopathy.   Htn changes   Procedures Next Due Date:    Diabetic Eye Exam: 06/2010   Diabetic Eye Exam  Procedure date:  06/08/2009  Findings:      No diabetic retinopathy.   Htn changes   Procedures Next Due Date:    Diabetic Eye Exam: 06/2010

## 2010-04-20 NOTE — Progress Notes (Signed)
Summary: refill/ hla  Phone Note Refill Request Message from:  Fax from Pharmacy on March 21, 2010 9:25 AM  Refills Requested: Medication #1:  PLAVIX 75 MG TABS Take 1 tablet by mouth once a day   Dosage confirmed as above?Dosage Confirmed   Last Refilled: 10/14/2009 dispenses 90 each refill last visit 01/26/10, last labs 10/18  Initial call taken by: Freddy Finner RN,  March 21, 2010 9:26 AM  Follow-up for Phone Call        has march appt Follow-up by: Larey Dresser MD,  March 21, 2010 2:06 PM    Prescriptions: PLAVIX 75 MG TABS (CLOPIDOGREL BISULFATE) Take 1 tablet by mouth once a day  #31 x 5   Entered and Authorized by:   Larey Dresser MD   Signed by:   Larey Dresser MD on 03/21/2010   Method used:   Faxed to ...       [facility] Department (retail)       [address] [place], [place]  [address]       Ph: [phone]       Fax: [phone]   RxID:   [id number]

## 2010-04-21 NOTE — Letter (Signed)
Summary: Meter DownLoad  Meter DownLoad   Imported By: Bonner Puna 06/08/2009 14:31:20  _____________________________________________________________________  External Attachment:    Type:   Image     Comment:   External Document

## 2010-05-01 ENCOUNTER — Other Ambulatory Visit: Payer: Self-pay | Admitting: *Deleted

## 2010-05-01 NOTE — Telephone Encounter (Signed)
Creatinine too high for metformin.  This will have to be addressed by Dr. Lynnae January at upcoming visit.

## 2010-05-01 NOTE — Telephone Encounter (Signed)
Her creatinine is 1.47 and metformin is contra-indicated.

## 2010-05-03 ENCOUNTER — Encounter: Payer: Self-pay | Admitting: Internal Medicine

## 2010-05-03 ENCOUNTER — Other Ambulatory Visit: Payer: Self-pay | Admitting: *Deleted

## 2010-05-03 DIAGNOSIS — G8929 Other chronic pain: Secondary | ICD-10-CM | POA: Insufficient documentation

## 2010-05-03 DIAGNOSIS — E1169 Type 2 diabetes mellitus with other specified complication: Secondary | ICD-10-CM | POA: Insufficient documentation

## 2010-05-03 DIAGNOSIS — R109 Unspecified abdominal pain: Secondary | ICD-10-CM | POA: Insufficient documentation

## 2010-05-03 DIAGNOSIS — I2510 Atherosclerotic heart disease of native coronary artery without angina pectoris: Secondary | ICD-10-CM | POA: Insufficient documentation

## 2010-05-03 DIAGNOSIS — N9489 Other specified conditions associated with female genital organs and menstrual cycle: Secondary | ICD-10-CM | POA: Insufficient documentation

## 2010-05-03 DIAGNOSIS — E785 Hyperlipidemia, unspecified: Secondary | ICD-10-CM

## 2010-05-03 DIAGNOSIS — N183 Chronic kidney disease, stage 3 unspecified: Secondary | ICD-10-CM | POA: Insufficient documentation

## 2010-05-03 DIAGNOSIS — D631 Anemia in chronic kidney disease: Secondary | ICD-10-CM | POA: Insufficient documentation

## 2010-05-03 DIAGNOSIS — N189 Chronic kidney disease, unspecified: Secondary | ICD-10-CM

## 2010-05-03 DIAGNOSIS — Z8673 Personal history of transient ischemic attack (TIA), and cerebral infarction without residual deficits: Secondary | ICD-10-CM | POA: Insufficient documentation

## 2010-05-03 DIAGNOSIS — Z862 Personal history of diseases of the blood and blood-forming organs and certain disorders involving the immune mechanism: Secondary | ICD-10-CM | POA: Insufficient documentation

## 2010-05-03 DIAGNOSIS — R0989 Other specified symptoms and signs involving the circulatory and respiratory systems: Secondary | ICD-10-CM | POA: Insufficient documentation

## 2010-05-03 DIAGNOSIS — E669 Obesity, unspecified: Secondary | ICD-10-CM

## 2010-05-03 DIAGNOSIS — J302 Other seasonal allergic rhinitis: Secondary | ICD-10-CM | POA: Insufficient documentation

## 2010-05-03 HISTORY — DX: Type 2 diabetes mellitus with other specified complication: E11.69

## 2010-05-03 HISTORY — DX: Type 2 diabetes mellitus with other specified complication: E66.9

## 2010-05-03 MED ORDER — GLIPIZIDE 10 MG PO TABS: 20.0000 mg | ORAL_TABLET | Freq: Two times a day (BID) | ORAL | Status: DC

## 2010-05-03 NOTE — Telephone Encounter (Signed)
Cannot be sent electronically nor fax. Would you pls call in?

## 2010-05-03 NOTE — Progress Notes (Signed)
This encounter was created in error - please disregard.

## 2010-05-03 NOTE — Telephone Encounter (Signed)
Addended by: Larey Dresser on: 05/03/2010 04:11 PM   Modules accepted: Orders

## 2010-05-03 NOTE — Telephone Encounter (Signed)
Reviewed my note from 02/07/10. I had addressed the creatinine level and metformin. I decided not to D/C the metformin at that time bc she had such great control and her creatinine bounced around a bit (1.47 > 1.27 > 1.32). I asked her to come back and repeat the BMP and she did not. Pls ask her to cont the metformin and come in for BMP now. I put in the order.  Thanks

## 2010-05-03 NOTE — Telephone Encounter (Signed)
Spoke with pt on yesterday said that her sugars have been in the 200's.  Had a fasting yesterday am of 73.   Had breakfast after that.  Was reminded of her next appointment.  Pt was also informed at that time that her Metformin had been denied based on her elevated kidney function levels.  Pt voiced understanding of the plan.

## 2010-05-04 ENCOUNTER — Encounter: Payer: Self-pay | Admitting: Internal Medicine

## 2010-05-04 ENCOUNTER — Telehealth: Payer: Self-pay | Admitting: *Deleted

## 2010-05-04 ENCOUNTER — Ambulatory Visit (INDEPENDENT_AMBULATORY_CARE_PROVIDER_SITE_OTHER): Payer: Self-pay | Admitting: Internal Medicine

## 2010-05-04 ENCOUNTER — Observation Stay (HOSPITAL_COMMUNITY)
Admission: AD | Admit: 2010-05-04 | Discharge: 2010-05-05 | Disposition: A | Discharge status: Home / Self Care | Payer: Self-pay | Source: Ambulatory Visit | Attending: Internal Medicine | Admitting: Internal Medicine

## 2010-05-04 VITALS — BP 152/75 | HR 86 | Temp 98.3°F | Wt 190.6 lb

## 2010-05-04 DIAGNOSIS — I2510 Atherosclerotic heart disease of native coronary artery without angina pectoris: Secondary | ICD-10-CM | POA: Insufficient documentation

## 2010-05-04 DIAGNOSIS — E119 Type 2 diabetes mellitus without complications: Secondary | ICD-10-CM | POA: Insufficient documentation

## 2010-05-04 DIAGNOSIS — Z8673 Personal history of transient ischemic attack (TIA), and cerebral infarction without residual deficits: Secondary | ICD-10-CM | POA: Insufficient documentation

## 2010-05-04 DIAGNOSIS — M272 Inflammatory conditions of jaws: Principal | ICD-10-CM | POA: Insufficient documentation

## 2010-05-04 DIAGNOSIS — R220 Localized swelling, mass and lump, head: Secondary | ICD-10-CM

## 2010-05-04 DIAGNOSIS — R51 Headache: Secondary | ICD-10-CM | POA: Insufficient documentation

## 2010-05-04 DIAGNOSIS — E785 Hyperlipidemia, unspecified: Secondary | ICD-10-CM | POA: Insufficient documentation

## 2010-05-04 DIAGNOSIS — R221 Localized swelling, mass and lump, neck: Secondary | ICD-10-CM

## 2010-05-04 DIAGNOSIS — K047 Periapical abscess without sinus: Secondary | ICD-10-CM

## 2010-05-04 DIAGNOSIS — N189 Chronic kidney disease, unspecified: Secondary | ICD-10-CM | POA: Insufficient documentation

## 2010-05-04 DIAGNOSIS — D649 Anemia, unspecified: Secondary | ICD-10-CM | POA: Insufficient documentation

## 2010-05-04 DIAGNOSIS — I129 Hypertensive chronic kidney disease with stage 1 through stage 4 chronic kidney disease, or unspecified chronic kidney disease: Secondary | ICD-10-CM | POA: Insufficient documentation

## 2010-05-04 LAB — BASIC METABOLIC PANEL
BUN: 19 mg/dL (ref 6–23)
Calcium: 9.6 mg/dL (ref 8.4–10.5)
Chloride: 103 mEq/L (ref 96–112)
Chloride: 104 mEq/L (ref 96–112)
Creat: 1.17 mg/dL (ref 0.40–1.20)
GFR calc non Af Amer: 54 mL/min — ABNORMAL LOW (ref >60)
Glucose, Bld: 93 mg/dL (ref 70–99)
Glucose, Bld: 147 mg/dL — ABNORMAL HIGH (ref 70–99)
Potassium: 4.4 mEq/L (ref 3.5–5.3)
Potassium: 4.1 mEq/L (ref 3.5–5.1)
Sodium: 138 mEq/L (ref 135–145)

## 2010-05-04 LAB — PROTIME-INR
INR: 1.04 (ref 0.00–1.49)
Prothrombin Time: 13.8 seconds (ref 11.6–15.2)

## 2010-05-04 LAB — HEPATIC FUNCTION PANEL
ALT: 22 U/L (ref 0–35)
AST: 20 U/L (ref 0–37)
Bilirubin, Direct: <0.1 mg/dL (ref 0.0–0.3)
Total Protein: 9 g/dL — ABNORMAL HIGH (ref 6.0–8.3)

## 2010-05-04 LAB — DIFFERENTIAL
Basophils Relative: 0 % (ref 0–1)
Monocytes Relative: 5 % (ref 3–12)
Neutro Abs: 11.1 10*3/uL — ABNORMAL HIGH (ref 1.7–7.7)
Neutrophils Relative %: 80 % — ABNORMAL HIGH (ref 43–77)

## 2010-05-04 LAB — POCT GLYCOSYLATED HEMOGLOBIN (HGB A1C): Hemoglobin A1C: 6.3

## 2010-05-04 LAB — CBC
Hemoglobin: 9.9 g/dL — ABNORMAL LOW (ref 12.0–15.0)
MCH: 24.9 pg — ABNORMAL LOW (ref 26.0–34.0)
RBC: 3.98 MIL/uL (ref 3.87–5.11)
WBC: 13.8 10*3/uL — ABNORMAL HIGH (ref 4.0–10.5)

## 2010-05-04 LAB — GLUCOSE, CAPILLARY: Glucose-Capillary: 94 mg/dL (ref 70–99)

## 2010-05-04 LAB — APTT: aPTT: 24 seconds (ref 24–37)

## 2010-05-04 NOTE — Telephone Encounter (Signed)
Call from pt said that she is having pain in swelling in her face.  Has a bad tooth.  Wants to know if she can be seen today.  Pain is a level 9 is taking Aleve with no relief.  Can be here in 20 minutes if needed.

## 2010-05-04 NOTE — Telephone Encounter (Signed)
If she comes in or goes to UC, please obtain BMP so that I can decided if I need to D/C the metformin. I have the order in EPIC already. Thanks

## 2010-05-04 NOTE — Telephone Encounter (Signed)
If we have an opening, would bring her in to clinic.  If not, would refer to urgent care.

## 2010-05-04 NOTE — Progress Notes (Signed)
  Subjective:    Patient ID: [name], female    DOB: 08/27/1945, 65 y.o.   MRN: [id number]  HPI 65 yr old woman with pmhx of diabetes comes to the clinic complaining of left sided swelling, pain lower mandible area. Patient noticed swelling noticed 3 days ago and has gotten worse. Pain started yesterday. Described as pressure pain. Took aleve but did not help. Reports that she broke her tooth on a peanut. Not associated with fever/chills, headaches, blurry vision.    Review of Systems  All other systems reviewed and are negative.       Objective:   Physical Exam  Constitutional: She is oriented to person, place, and time. She appears well-developed.  HENT:       Left mandibular swelling, ttp and warm to touch  Eyes: EOM are normal. Pupils are equal, round, and reactive to light.  Neck: Normal range of motion. Neck supple.  Cardiovascular: Normal rate, regular rhythm and normal heart sounds.   Pulmonary/Chest: Effort normal and breath sounds normal.  Abdominal: Soft. Bowel sounds are normal.  Musculoskeletal: Normal range of motion.  Neurological: She is alert and oriented to person, place, and time. No cranial nerve deficit. Coordination normal.  Psychiatric: She has a normal mood and affect.          Assessment & Plan:

## 2010-05-04 NOTE — Telephone Encounter (Signed)
Faxed to [facility] Department.

## 2010-05-04 NOTE — Progress Notes (Signed)
Report called to nurse on 4500.  Pt transported to 4507 via wheel chair.

## 2010-05-05 ENCOUNTER — Other Ambulatory Visit: Payer: Self-pay | Admitting: Internal Medicine

## 2010-05-05 ENCOUNTER — Encounter (HOSPITAL_COMMUNITY): Payer: Self-pay | Admitting: Radiology

## 2010-05-05 ENCOUNTER — Encounter: Payer: Self-pay | Admitting: Internal Medicine

## 2010-05-05 ENCOUNTER — Inpatient Hospital Stay (HOSPITAL_COMMUNITY): Payer: Self-pay

## 2010-05-05 DIAGNOSIS — K047 Periapical abscess without sinus: Secondary | ICD-10-CM

## 2010-05-05 LAB — LIPID PANEL
HDL: 40 mg/dL (ref >39)
Total CHOL/HDL Ratio: 4 RATIO
Triglycerides: 243 mg/dL — ABNORMAL HIGH (ref <150)
VLDL: 49 mg/dL — ABNORMAL HIGH (ref 0–40)

## 2010-05-05 LAB — CBC
Hemoglobin: 9.8 g/dL — ABNORMAL LOW (ref 12.0–15.0)
Platelets: 322 10*3/uL (ref 150–400)
RBC: 3.87 MIL/uL (ref 3.87–5.11)
WBC: 12.4 10*3/uL — ABNORMAL HIGH (ref 4.0–10.5)

## 2010-05-05 LAB — HEPATIC FUNCTION PANEL
ALT: 18 U/L (ref 0–35)
AST: 17 U/L (ref 0–37)
Albumin: 3.5 g/dL (ref 3.5–5.2)
Alkaline Phosphatase: 113 U/L (ref 39–117)
Total Protein: 8.8 g/dL — ABNORMAL HIGH (ref 6.0–8.3)

## 2010-05-05 LAB — GLUCOSE, CAPILLARY
Glucose-Capillary: 166 mg/dL — ABNORMAL HIGH (ref 70–99)
Glucose-Capillary: 131 mg/dL — ABNORMAL HIGH (ref 70–99)
Glucose-Capillary: 162 mg/dL — ABNORMAL HIGH (ref 70–99)

## 2010-05-05 LAB — HEMOGLOBIN A1C: Hgb A1c MFr Bld: 6 % — ABNORMAL HIGH (ref <5.7)

## 2010-05-05 MED ORDER — IOHEXOL 300 MG/ML  SOLN: 50.0000 mL | Freq: Once | INTRAMUSCULAR | Status: DC | PRN

## 2010-05-05 NOTE — Telephone Encounter (Signed)
Pt was given ana appointment for 05/04/2010.  Was seen and admitted to 4507.

## 2010-05-05 NOTE — Assessment & Plan Note (Signed)
Concerning for dental abscess. Will admit to regular floor. Start on Unasyn. Check CT Neck w/ contrast to rule out abscess. Further management per inpatient team.

## 2010-05-05 NOTE — Assessment & Plan Note (Signed)
At goal. Metformin has been discontinued due to chronic renal insufficiency. Will continue other meds. Will check micro/albumin creatinine ratio and foot exam on follow up if not already done.

## 2010-05-10 LAB — CULTURE, BLOOD (ROUTINE X 2)
Culture  Setup Time: 201202162337
Culture: NO GROWTH

## 2010-05-10 NOTE — Initial Assessments (Signed)
INTERNAL MEDICINE ADMISSION HISTORY AND PHYSICAL  PCP: Dr. Lynnae January  CC: Dental Absess  HPI:  65 yr old woman with PMH CAD, DM, HTN who presented to the clinic complaining of left sided facial swelling and pain of the lower mandible area. Symptoms began yesterday and have worsened.  9/10 throbbing pain.  Tried  aleve but did not help. Reports that she broke her tooth on that side on a peanut some time ago. Not associated with fever/chills, headaches, blurry vision. No nausea, vomiting or diarrhea.  No difficulty breathing or swallowing.  Some difficulty chewing because of the pain.    ALLERGIES: ! VALIUM (DIAZEPAM) ! MORPHINE SULFATE (MORPHINE SULFATE) ! HALDOL ! DARVON ! ATIVAN ! TRAMADOL HCL   PAST MEDICAL HISTORY: CAD      1996 - PTCA and angioplasty diagonal branch      2000 - Rotoblator & angioplasty of diagonal      2006 - subendocardial AMI, DES to poximal LAD. Also had 90% stenosis in distal apical LAD. EF 55%.                      Apical hypokinesis      Indefinite ASA and Plavix  HTN      2006 B renal arteries patient      2003 MRA - no RAS      2003 - pheo W/U Dr Hassell Done reportedly negative  Chronic N / V / D      Presumptive dx Crohn's dx per elevated P ANCA      Failed Entocort and Pentasa      Sep 2003 - Ileocolectomy c anastomosis Dr Deon Pilling 2/2 adhesions - path was NEGATIVE for Crohns       EGD, small-bowel follow-through (11/03), and an enteroclysis (10/03)  were unrevealing.            Causes hypo Mag and hypocalcemia  Anemia - multifactorial      Baseline HgB 9-11ish      B12 def - 150 in 3/10      Fe def - ferritin 35 3/10      Both are being repleted  CVA      Incidental finding MRI 2002 L lacunar infarct  DM II insulin dependent  Hyperlipidemia  Seasonal allergies  Chronic pain      CT 10/10 Spinal stenosis L2 - S1  CRI - baseline Cr 1.2-1.4  Adnexal mass 8/03      s/p Laparoscopic BSO (R ovarian fibroma) & lysis of adhesion     MEDICATIONS: PLAVIX 75 MG TABS (CLOPIDOGREL BISULFATE) Take 1 tablet by mouth once a day WELCHOL 625 MG TABS (COLESEVELAM HCL) 3 tablets once daily ATENOLOL 50 MG TABS (ATENOLOL) Take 1 tablet by mouth two times a day HYDROXYZINE HCL 50 MG TABS (HYDROXYZINE HCL) Take 1 tablet by mouth at bedtime as neede for sleep NITROSTAT 0.4 MG SUBL (NITROGLYCERIN) as needed BAYER LOW STRENGTH 81 MG TBEC (ASPIRIN) Take 1 tablet by mouth once a day NORVASC 10 MG  TABS (AMLODIPINE BESYLATE) take 1 tablet by mouth daily. [facility] IRON 325 (65 FE) MG  TABS (FERROUS SULFATE) Take 1 tablet by mouth three times a day METFORMIN HCL 1000 MG TABS (METFORMIN HCL) Take 1 tablet by mouth twice a day. 1ST CHOICE LANCETS THIN   MISC (LANCETS)  LIPITOR 20 MG TABS (ATORVASTATIN CALCIUM) take 1 tablet by mouth daily. GLUCOTROL 10 MG TABS (GLIPIZIDE) Take 2 tablets in the morning and 2 tablet in  the evening by mouth TRUETRACK TEST   STRP (GLUCOSE BLOOD) use to test blood sugar twice daily LANCETS   MISC (LANCETS) use to test blood glucose twice daily COBAL-1000 1000 MCG/ML SOLN (CYANOCOBALAMIN) inject IM daily for 1 week then 59m every week for 4 weeks. * B-12 MONTHLY  LANTUS 100 UNIT/ML SOLN (INSULIN GLARGINE) Inject 30  units subq at bedtime INSULIN SYRINGE 28G X 1/2" 1 ML MISC (INSULIN SYRINGE-NEEDLE U-100) Use to inject insulin as instructed. DIABETIC TUSSIN DM 100-10 MG/5ML LIQD (DEXTROMETHORPHAN-GUAIFENESIN) Take 138mby mouth every 4 hours as needed for cough ACCUPRIL 10 MG TABS (QUINAPRIL HCL) one by mouth once daily for high blood pressure and to protect your kidneys LORATADINE 10 MG TABS (LORATADINE) Take one pill once daily for allergies   SOCIAL HISTORY: Single, has 5 children, lives alone. Never Smoked, no alcohol use, no drug use. 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  VITALS: T:98.3  P:86  BP:152/75  R:  O2SAT:  ON:RA  PHYSICAL EXAM: Gen: Patient is in NAD, Pleasant. Eyes: PERRL, EOMI, No signs of anemia or jaundince. ENT: left mandibular swelling, ttp and warm to touch; no oral exudates - there is swelling of the corresponding area of gum on her rear mandible, exquisitely tender. Neck: Supple, No carotid Bruits, No JVD, No thyromegaly Resp: CTA- Bilaterally, No W/C/R. CVS: RRR, 2/6 SEM at LUSB GI: Abdomen is soft. ND, NT, NG, NR, BS+. No organomegaly.   Ext: No pedal edema, cyanosis or clubbing. GU: No CVA tenderness. Skin: No visible rashes, scars. Lymph: No cervical lymphadenopathy. MS: Moving all 4 extremities. Neuro: A&O X3, CN II - XII are grossly intact. Motor strength is 5/5 in the all 4 extremities, Sensations intact to light touch Psych: Appropriate   LABS:  WBC                                      13.8       h      4.0-10.5         K/uL  RBC                                      3.98              3.87-5.11        MIL/uL  Hemoglobin (HGB)                         9.9        l      12.0-15.0        g/dL  Hematocrit (HCT)                         30.7       l      36.0-46.0        %  MCV  77.1       l      78.0-100.0       fL  MCH -                                    24.9       l      26.0-34.0        pg  MCHC                                     32.2              30.0-36.0        g/dL  RDW                                      14.7              11.5-15.5        %  Platelet Count (PLT)                     332               150-400          K/uL  Neutrophils, %                           80         h      43-77            %  Lymphocytes, %                           14                12-46            %  Monocytes, %                             5                 3-12              %  Eosinophils, %                           0                 0-5              %  Basophils, %                             0                 0-1              %  Neutrophils, Absolute                    11.1       h      1.7-7.7  K/uL  Lymphocytes, Absolute                    2.0               0.7-4.0          K/uL  Monocytes, Absolute                      0.7               0.1-1.0          K/uL  Eosinophils, Absolute                    0.1               0.0-0.7          K/uL  Basophils, Absolute                      0.0               0.0-0.1          K/uL   Protime ( Prothrombin Time)              13.8              11.6-15.2        seconds  INR                                      1.04              0.00-1.49  PTT(a-Partial Thromboplastn Time)        24                24-37            seconds   Bilirubin, Total                         0.5               0.3-1.2          mg/dL  Bilirubin, Direct                        <0.1              0.0-0.3          mg/dL  Indirect Bilirubin                       SEE NOTE.         0.3-0.9          mg/dL    NOT CALCULATED  Alkaline Phosphatase                     115               39-117           U/L  SGOT (AST)                               20                0-37  U/L  SGPT (ALT)                               22                0-35             U/L  Total  Protein                           9.0        h      6.0-8.3          g/dL  Albumin-Blood                            4.0               3.5-5.2          g/dL   ASSESSMENT AND PLAN:  (1) Left-sided facial swelling:  patient noticed left-sided swelling starting yesterday, which is progressively getting worse,  given her presentation we'll admit her for IV antibiotics and pain control - concern is for dental abscess. Afebrile with leukocytosis. - Will order a head CT to rule out bony involvements, abscess formation - blood cultures x2. - CBC now and AM - oxycodone as needed for  pain control - mechanical soft diet  () DM: HgbA1C 6.3 (01/2010). well controlled.  - continue home lantus - SSI-sensitive - CBGs qachs - holding metformin as pt to get CT for (1)  () HTN -  Poorly controlled on presentation to the clinic, likely 2/2 pain.  Will restart home antihypertensives and continue to monitor  () CRI:  baseline Cr 1.2-1.4.  Will check B-Met and monitor.    () CAD - Currently denies CP - continue ASA and Plavix, BP control  () HLD - Will recheck Lipid panel and liver function tests. We will continue home statin for now.   () Anemia - Multifactorial. MCV is normocytic, Currently Hg stable and there are no signs of active bleeding, will monitor and check FOBT x3, CBC. Continue home iron supplementation.  () VTE PROPH: Lovenox

## 2010-05-12 NOTE — Telephone Encounter (Signed)
Pt came in for an appointment.  Was admitted to the hospital.

## 2010-05-23 ENCOUNTER — Ambulatory Visit (INDEPENDENT_AMBULATORY_CARE_PROVIDER_SITE_OTHER): Payer: Self-pay | Admitting: Internal Medicine

## 2010-05-23 ENCOUNTER — Encounter: Payer: Self-pay | Admitting: Internal Medicine

## 2010-05-23 VITALS — BP 139/65 | HR 110 | Temp 98.4°F | Ht 65.0 in | Wt 192.6 lb

## 2010-05-23 DIAGNOSIS — G8929 Other chronic pain: Secondary | ICD-10-CM

## 2010-05-23 DIAGNOSIS — E119 Type 2 diabetes mellitus without complications: Secondary | ICD-10-CM

## 2010-05-23 DIAGNOSIS — K047 Periapical abscess without sinus: Secondary | ICD-10-CM

## 2010-05-23 DIAGNOSIS — I10 Essential (primary) hypertension: Secondary | ICD-10-CM

## 2010-05-23 DIAGNOSIS — I2510 Atherosclerotic heart disease of native coronary artery without angina pectoris: Secondary | ICD-10-CM

## 2010-05-23 DIAGNOSIS — E785 Hyperlipidemia, unspecified: Secondary | ICD-10-CM

## 2010-05-23 DIAGNOSIS — R220 Localized swelling, mass and lump, head: Secondary | ICD-10-CM

## 2010-05-23 DIAGNOSIS — K044 Acute apical periodontitis of pulpal origin: Secondary | ICD-10-CM

## 2010-05-23 DIAGNOSIS — D649 Anemia, unspecified: Secondary | ICD-10-CM

## 2010-05-23 MED ORDER — NITROGLYCERIN 0.4 MG SL SUBL: 0.4000 mg | SUBLINGUAL_TABLET | SUBLINGUAL | Status: DC | PRN

## 2010-05-23 MED ORDER — CYANOCOBALAMIN 1000 MCG/ML IJ SOLN
1000.0000 ug | Freq: Once | INTRAMUSCULAR | Status: AC
  Administered 2010-05-23: 1000 ug via INTRAMUSCULAR

## 2010-05-23 NOTE — Assessment & Plan Note (Addendum)
[name] no longer has significant back pain.   Today Ms. Starks complains of pain in her right hand.  She has significant pain on her first MCP joint. It is significantly tender to palpation. She also has pain on the second and third digit on the MCP, the PIP, and DIP joints. The MCP joints are slightly enlarged and tender to palpation. There is no bogginess. She has no symptoms of nerve involvement so I do not think that this is consistent with carpal tunnel syndrome. I think this is osteoarthritis. The MCP joint is common to get osteoarthritis. She has taken Tylenol arthritis strength and Aleve for arthritis strength without any improvement in her pain. Her pain is interfering with her job. 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 She is not interested in taking any narcotics as they always cause stomach upset. Therefore I have referred her to sports medicine to have consideration of a steroid injection and a splint to help with the MCP joint of her first digit

## 2010-05-23 NOTE — Patient Instructions (Signed)
Please let me know if your sugars start to increase.  I have referred you to sports medicine to help with your arthritis

## 2010-05-23 NOTE — Assessment & Plan Note (Signed)
She still has pain but does not require any pain medicine. She has been referred to the dentist but has not heard from them and has not been given an appointment date. She has no insurance so this is a rather tricky situation.

## 2010-05-23 NOTE — Progress Notes (Signed)
  Subjective:    Patient ID: [name], female    DOB: 1945-09-19, 65 y.o.   MRN: [id number]  HPI  Please see the A&P for the status of the pt's chronic medical problems.   Review of Systems  Constitutional: Negative for appetite change.  HENT: Positive for dental problem. Negative for facial swelling, neck pain and voice change.   Cardiovascular: Negative for chest pain and leg swelling.  Musculoskeletal: Positive for joint swelling and arthralgias. Negative for back pain.       Objective:   Physical Exam  Constitutional: She is oriented to person, place, and time. Vital signs are normal. She appears well-developed and well-nourished. She is cooperative. She does not appear ill. No distress.  HENT:  Head: Normocephalic and atraumatic.  Right Ear: External ear normal.  Left Ear: External ear normal.  Mouth/Throat: Oropharynx is clear and moist.  Eyes: Conjunctivae, EOM and lids are normal. Left conjunctiva is not injected. No scleral icterus.  Neck: Neck supple. Carotid bruit is not present. No tracheal deviation and normal range of motion present. No mass and no thyromegaly present.  Cardiovascular: Normal rate, regular rhythm and normal heart sounds.   Pulmonary/Chest: Effort normal and breath sounds normal. No respiratory distress. She has no wheezes.  Musculoskeletal: She exhibits edema. She exhibits no tenderness.       Right hand: She exhibits decreased range of motion, tenderness, bony tenderness ( there is significant tenderness to palpation over the MCP joint along the second and the third MCP joint there was bony enlargement of the second and third MC) and deformity. She exhibits normal capillary refill, no laceration and no swelling. Decreased sensation is not present in the ulnar distribution, is not present in the medial distribution and is not present in the radial distribution. Decreased strength noted. She exhibits thumb/finger opposition. She exhibits no finger  abduction.  Lymphadenopathy:    She has no cervical adenopathy.  Neurological: She is alert and oriented to person, place, and time.  Skin: Skin is warm and dry. No rash noted. She is not diaphoretic. No erythema. No pallor.  Psychiatric: She has a normal mood and affect. Her behavior is normal. Judgment and thought content normal.          Assessment & Plan:

## 2010-05-23 NOTE — Assessment & Plan Note (Addendum)
BP Readings from Last 3 Encounters:  05/23/10 139/65  05/04/10 152/75  02/07/10 134/77     [name]'s blood pressure was slightly elevated today. She checks her blood pressure at home and her readings are always around 120/65.  She might have a slight element of white coat hypertension. For now, I am going to leave her on her current regimen and continue to follow her regimen. Her last basic metabolic panel was about 3 weeks ago and was OK.

## 2010-05-23 NOTE — Assessment & Plan Note (Signed)
[name] uses both Lipitor and WelChol. Her LDL is well controlled although her triglyceride was slightly elevated on her most recent lipid panel at a level of 243. This has come down significantly from the 700s one year ago. Therefore I'm going to continue current medication and follow her fasting lipid panels. She is having no side effects from her medications.

## 2010-05-23 NOTE — Assessment & Plan Note (Signed)
[name] needs a B12 injection today and will receive that.

## 2010-05-23 NOTE — Assessment & Plan Note (Signed)
This has resolved and therefore I'm going to take it off of her problem list.

## 2010-05-23 NOTE — Assessment & Plan Note (Addendum)
Katie. [name] checks her sugars twice a day before breakfast and also before dinner. She brought in her meter today and it was downloaded. Her morning fasting sugars were fantastic. Her before dinner sugars were slightly elevated ranging from 112 to 196. She also had some slightly elevated post prandial fingers. She currently is on Lantus 30 units at bedtime and glipizide. She had been on metformin but this was stopped due to an elevated creatinine. She did not have any side effects to the metformin. However her most recent creatinine was 1.0 which is definitely within the range that metformin can be used. Currently she is going to continue to monitor her sugars and if her A1c elevates or if she develops hyperglycemia I can restart her metformin as long as I monitor her creatinine.  She goes to an eye doctor on [address] and last saw him in November or December to have an eye exam.  She is up-to-date on the remaining diabetic screening examinations.   [name] does not like the glucometer that she currently has. She requested a true track glucometer because she states to [facility] outpatient pharmacy carries these. I asked [name] and on actually had a sample true track glucometer that she was able to give [name].

## 2010-05-23 NOTE — Assessment & Plan Note (Addendum)
[name] has no chest pain. She has not had to use the nitroglycerin however her current supply of nitroglycerin has expired and she would like a refill.

## 2010-05-30 LAB — GLUCOSE, CAPILLARY: Glucose-Capillary: 128 mg/dL — ABNORMAL HIGH (ref 70–99)

## 2010-06-09 LAB — GLUCOSE, CAPILLARY: Glucose-Capillary: 109 mg/dL — ABNORMAL HIGH (ref 70–99)

## 2010-06-13 ENCOUNTER — Other Ambulatory Visit: Payer: Self-pay | Admitting: *Deleted

## 2010-06-14 MED ORDER — QUINAPRIL HCL 10 MG PO TABS: 10.0000 mg | ORAL_TABLET | Freq: Every day | ORAL | Status: DC

## 2010-06-14 NOTE — Telephone Encounter (Signed)
She was supposed to make June appt when she last saw me and apparently left without sch. If the June sch is made, would you pls sch her with me? Thanks

## 2010-06-22 LAB — URINALYSIS, ROUTINE W REFLEX MICROSCOPIC
Ketones, ur: NEGATIVE mg/dL
Leukocytes, UA: NEGATIVE
Nitrite: NEGATIVE
Specific Gravity, Urine: 1.016 (ref 1.005–1.030)
pH: 5.5 (ref 5.0–8.0)

## 2010-06-22 LAB — URINE MICROSCOPIC-ADD ON

## 2010-06-23 LAB — GLUCOSE, CAPILLARY: Glucose-Capillary: 172 mg/dL — ABNORMAL HIGH (ref 70–99)

## 2010-06-25 LAB — BASIC METABOLIC PANEL
BUN: 10 mg/dL (ref 6–23)
BUN: 11 mg/dL (ref 6–23)
BUN: 13 mg/dL (ref 6–23)
BUN: 6 mg/dL (ref 6–23)
CO2: 20 mEq/L (ref 19–32)
CO2: 20 mEq/L (ref 19–32)
CO2: 20 mEq/L (ref 19–32)
CO2: 22 mEq/L (ref 19–32)
Calcium: 8.7 mg/dL (ref 8.4–10.5)
Calcium: 9 mg/dL (ref 8.4–10.5)
Calcium: 9.1 mg/dL (ref 8.4–10.5)
Chloride: 108 mEq/L (ref 96–112)
Chloride: 108 mEq/L (ref 96–112)
Chloride: 110 mEq/L (ref 96–112)
Creatinine, Ser: 0.78 mg/dL (ref 0.4–1.2)
Creatinine, Ser: 0.84 mg/dL (ref 0.4–1.2)
Creatinine, Ser: 0.85 mg/dL (ref 0.4–1.2)
Creatinine, Ser: 0.88 mg/dL (ref 0.4–1.2)
GFR calc Af Amer: >60 mL/min (ref >60)
GFR calc Af Amer: >60 mL/min (ref >60)
GFR calc Af Amer: >60 mL/min (ref >60)
GFR calc Af Amer: >60 mL/min (ref >60)
GFR calc Af Amer: >60 mL/min (ref >60)
GFR calc non Af Amer: 60 mL/min — ABNORMAL LOW (ref >60)
GFR calc non Af Amer: >60 mL/min (ref >60)
GFR calc non Af Amer: >60 mL/min (ref >60)
Glucose, Bld: 172 mg/dL — ABNORMAL HIGH (ref 70–99)
Glucose, Bld: 216 mg/dL — ABNORMAL HIGH (ref 70–99)
Glucose, Bld: 230 mg/dL — ABNORMAL HIGH (ref 70–99)
Potassium: 2.7 mEq/L — CL (ref 3.5–5.1)
Potassium: 3.3 mEq/L — ABNORMAL LOW (ref 3.5–5.1)
Potassium: 3.7 mEq/L (ref 3.5–5.1)
Sodium: 135 mEq/L (ref 135–145)
Sodium: 136 mEq/L (ref 135–145)
Sodium: 136 mEq/L (ref 135–145)
Sodium: 138 mEq/L (ref 135–145)

## 2010-06-25 LAB — CBC
HCT: 31.2 % — ABNORMAL LOW (ref 36.0–46.0)
HCT: 32.3 % — ABNORMAL LOW (ref 36.0–46.0)
HCT: 36.2 % (ref 36.0–46.0)
Hemoglobin: 10.7 g/dL — ABNORMAL LOW (ref 12.0–15.0)
Hemoglobin: 10.9 g/dL — ABNORMAL LOW (ref 12.0–15.0)
MCHC: 33.9 g/dL (ref 30.0–36.0)
MCHC: 34.2 g/dL (ref 30.0–36.0)
MCHC: 33.1 g/dL (ref 30.0–36.0)
MCV: 82.3 fL (ref 78.0–100.0)
MCV: 82.3 fL (ref 78.0–100.0)
MCV: 83.2 fL (ref 78.0–100.0)
MCV: 83 fL (ref 78.0–100.0)
Platelets: 271 10*3/uL (ref 150–400)
Platelets: 289 10*3/uL (ref 150–400)
Platelets: 368 10*3/uL (ref 150–400)
RBC: 3.6 MIL/uL — ABNORMAL LOW (ref 3.87–5.11)
RBC: 3.77 MIL/uL — ABNORMAL LOW (ref 3.87–5.11)
RBC: 3.83 MIL/uL — ABNORMAL LOW (ref 3.87–5.11)
RBC: 3.93 MIL/uL (ref 3.87–5.11)
RBC: 4.4 MIL/uL (ref 3.87–5.11)
RDW: 14.3 % (ref 11.5–15.5)
RDW: 14.7 % (ref 11.5–15.5)
WBC: 10.4 10*3/uL (ref 4.0–10.5)
WBC: 10.6 10*3/uL — ABNORMAL HIGH (ref 4.0–10.5)
WBC: 7.6 10*3/uL (ref 4.0–10.5)
WBC: 9.6 10*3/uL (ref 4.0–10.5)

## 2010-06-25 LAB — DIFFERENTIAL
Basophils Absolute: 0 10*3/uL (ref 0.0–0.1)
Basophils Absolute: 0 10*3/uL (ref 0.0–0.1)
Basophils Relative: 0 % (ref 0–1)
Eosinophils Absolute: 0 10*3/uL (ref 0.0–0.7)
Eosinophils Relative: 1 % (ref 0–5)
Lymphocytes Relative: 21 % (ref 12–46)
Monocytes Absolute: 0.5 10*3/uL (ref 0.1–1.0)
Monocytes Relative: 5 % (ref 3–12)
Neutro Abs: 7.7 10*3/uL (ref 1.7–7.7)
Neutrophils Relative %: 62 % (ref 43–77)

## 2010-06-25 LAB — COMPREHENSIVE METABOLIC PANEL
ALT: 16 U/L (ref 0–35)
Alkaline Phosphatase: 91 U/L (ref 39–117)
BUN: 40 mg/dL — ABNORMAL HIGH (ref 6–23)
BUN: 51 mg/dL — ABNORMAL HIGH (ref 6–23)
CO2: 18 mEq/L — ABNORMAL LOW (ref 19–32)
CO2: 20 mEq/L (ref 19–32)
Chloride: 105 mEq/L (ref 96–112)
Chloride: 111 mEq/L (ref 96–112)
Creatinine, Ser: 1.78 mg/dL — ABNORMAL HIGH (ref 0.4–1.2)
GFR calc non Af Amer: 29 mL/min — ABNORMAL LOW (ref >60)
GFR calc non Af Amer: 40 mL/min — ABNORMAL LOW (ref >60)
Glucose, Bld: 233 mg/dL — ABNORMAL HIGH (ref 70–99)
Glucose, Bld: 283 mg/dL — ABNORMAL HIGH (ref 70–99)
Potassium: 4.1 mEq/L (ref 3.5–5.1)
Sodium: 138 mEq/L (ref 135–145)
Total Bilirubin: 0.4 mg/dL (ref 0.3–1.2)
Total Bilirubin: 0.7 mg/dL (ref 0.3–1.2)

## 2010-06-25 LAB — GLUCOSE, CAPILLARY
Glucose-Capillary: 127 mg/dL — ABNORMAL HIGH (ref 70–99)
Glucose-Capillary: 138 mg/dL — ABNORMAL HIGH (ref 70–99)
Glucose-Capillary: 140 mg/dL — ABNORMAL HIGH (ref 70–99)
Glucose-Capillary: 144 mg/dL — ABNORMAL HIGH (ref 70–99)
Glucose-Capillary: 157 mg/dL — ABNORMAL HIGH (ref 70–99)
Glucose-Capillary: 158 mg/dL — ABNORMAL HIGH (ref 70–99)
Glucose-Capillary: 163 mg/dL — ABNORMAL HIGH (ref 70–99)
Glucose-Capillary: 175 mg/dL — ABNORMAL HIGH (ref 70–99)
Glucose-Capillary: 175 mg/dL — ABNORMAL HIGH (ref 70–99)
Glucose-Capillary: 178 mg/dL — ABNORMAL HIGH (ref 70–99)
Glucose-Capillary: 198 mg/dL — ABNORMAL HIGH (ref 70–99)
Glucose-Capillary: 200 mg/dL — ABNORMAL HIGH (ref 70–99)
Glucose-Capillary: 213 mg/dL — ABNORMAL HIGH (ref 70–99)
Glucose-Capillary: 234 mg/dL — ABNORMAL HIGH (ref 70–99)
Glucose-Capillary: 241 mg/dL — ABNORMAL HIGH (ref 70–99)
Glucose-Capillary: 46 mg/dL — ABNORMAL LOW (ref 70–99)
Glucose-Capillary: 178 mg/dL — ABNORMAL HIGH (ref 70–99)
Glucose-Capillary: 214 mg/dL — ABNORMAL HIGH (ref 70–99)

## 2010-06-25 LAB — CARDIAC PANEL(CRET KIN+CKTOT+MB+TROPI)
CK, MB: 1.4 ng/mL (ref 0.3–4.0)
CK, MB: 1.4 ng/mL (ref 0.3–4.0)
CK, MB: 1.5 ng/mL (ref 0.3–4.0)
Relative Index: 1.1 (ref 0.0–2.5)
Relative Index: 1.1 (ref 0.0–2.5)
Relative Index: 1.2 (ref 0.0–2.5)
Total CK: 126 U/L (ref 7–177)
Total CK: 137 U/L (ref 7–177)
Troponin I: 0.01 ng/mL (ref 0.00–0.06)
Troponin I: 0.03 ng/mL (ref 0.00–0.06)

## 2010-06-25 LAB — MAGNESIUM
Magnesium: 1.3 mg/dL — ABNORMAL LOW (ref 1.5–2.5)
Magnesium: 1.4 mg/dL — ABNORMAL LOW (ref 1.5–2.5)
Magnesium: 1.6 mg/dL (ref 1.5–2.5)
Magnesium: 1.6 mg/dL (ref 1.5–2.5)
Magnesium: 1.6 mg/dL (ref 1.5–2.5)

## 2010-06-25 LAB — STOOL CULTURE

## 2010-06-25 LAB — PHOSPHORUS: Phosphorus: 2.7 mg/dL (ref 2.3–4.6)

## 2010-06-25 LAB — T4: T4, Total: 10.1 ug/dL (ref 5.0–12.5)

## 2010-06-25 LAB — LIPASE, BLOOD
Lipase: 104 U/L — ABNORMAL HIGH (ref 11–59)
Lipase: 49 U/L (ref 11–59)

## 2010-06-25 LAB — URINALYSIS, ROUTINE W REFLEX MICROSCOPIC
Glucose, UA: NEGATIVE mg/dL
Hgb urine dipstick: NEGATIVE
Leukocytes, UA: NEGATIVE
Nitrite: NEGATIVE
Protein, ur: 30 mg/dL — AB
Specific Gravity, Urine: 1.019 (ref 1.005–1.030)
Specific Gravity, Urine: 1.024 (ref 1.005–1.030)
Urobilinogen, UA: 0.2 mg/dL (ref 0.0–1.0)
Urobilinogen, UA: 0.2 mg/dL (ref 0.0–1.0)
pH: 5 (ref 5.0–8.0)

## 2010-06-25 LAB — VITAMIN B12: Vitamin B-12: 1079 pg/mL — ABNORMAL HIGH (ref 211–911)

## 2010-06-25 LAB — GIARDIA/CRYPTOSPORIDIUM SCREEN(EIA)
Cryptosporidium Screen (EIA): NEGATIVE
Giardia Screen - EIA: NEGATIVE

## 2010-06-25 LAB — T3: T3, Total: 82.6 ng/dl (ref 80.0–204.0)

## 2010-06-25 LAB — POCT I-STAT, CHEM 8
HCT: 41 % (ref 36.0–46.0)
Hemoglobin: 13.9 g/dL (ref 12.0–15.0)
Potassium: 4 mEq/L (ref 3.5–5.1)
Sodium: 142 mEq/L (ref 135–145)

## 2010-06-25 LAB — TISSUE TRANSGLUTAMINASE, IGA: Tissue Transglutaminase Ab, IgA: 0.8 U/mL (ref <7)

## 2010-06-25 LAB — POTASSIUM, STOOL: Potassium, Stl: 84 meq/kg (ref 0–200)

## 2010-06-25 LAB — HEPATIC FUNCTION PANEL
Bilirubin, Direct: <0.1 mg/dL (ref 0.0–0.3)
Total Protein: 8.2 g/dL (ref 6.0–8.3)

## 2010-06-25 LAB — ENDOMYSIAL IGA ANTIBODY: Endomysial IgA Autoabs: <1:10 {titer}

## 2010-06-25 LAB — URINE MICROSCOPIC-ADD ON

## 2010-06-25 LAB — TSH: TSH: 0.658 u[IU]/mL (ref 0.350–4.500)

## 2010-06-25 LAB — OSMOLALITY, STOOL: Osmolality,Stl: 331 mosm/kg — ABNORMAL HIGH (ref 220–280)

## 2010-06-25 LAB — MAGNESIUM, FECES: Magnesium, Feces: 5 meq/kg (ref 0–200)

## 2010-06-28 ENCOUNTER — Other Ambulatory Visit: Payer: Self-pay | Admitting: *Deleted

## 2010-06-28 LAB — GLUCOSE, CAPILLARY: Glucose-Capillary: 186 mg/dL — ABNORMAL HIGH (ref 70–99)

## 2010-06-28 MED ORDER — COLESEVELAM HCL 625 MG PO TABS: 1875.0000 mg | ORAL_TABLET | Freq: Every day | ORAL | Status: DC

## 2010-06-28 NOTE — Telephone Encounter (Signed)
Pt has an appt scheduled 08/01/10.

## 2010-07-05 NOTE — Telephone Encounter (Signed)
RX called in to [facility].

## 2010-07-26 ENCOUNTER — Other Ambulatory Visit: Payer: Self-pay | Admitting: *Deleted

## 2010-07-26 MED ORDER — LORATADINE 10 MG PO TABS: 10.0000 mg | ORAL_TABLET | Freq: Every day | ORAL | Status: DC

## 2010-07-26 NOTE — Telephone Encounter (Signed)
Pt states she has been using Clarinex 25m PRN but wants Claritin.

## 2010-07-26 NOTE — Telephone Encounter (Signed)
Claritin called to [facility].

## 2010-07-27 ENCOUNTER — Ambulatory Visit: Payer: Self-pay | Admitting: Family Medicine

## 2010-07-27 ENCOUNTER — Telehealth: Payer: Self-pay | Admitting: *Deleted

## 2010-07-27 MED ORDER — DESLORATADINE 5 MG PO TABS: 5.0000 mg | ORAL_TABLET | Freq: Every day | ORAL | Status: DC

## 2010-07-27 NOTE — Telephone Encounter (Signed)
[facility] called this am, they can not get claritin, in order for it to be supplied [facility] it will have to be clarinex, i called pt and explained this, she was offered the option of another pharmacy and she states no, just to leave med at [facility]inex so she can continue to get it through [facility].

## 2010-07-31 NOTE — Telephone Encounter (Signed)
Called to pharm 

## 2010-08-01 ENCOUNTER — Ambulatory Visit (INDEPENDENT_AMBULATORY_CARE_PROVIDER_SITE_OTHER): Payer: Self-pay | Admitting: Internal Medicine

## 2010-08-01 ENCOUNTER — Encounter: Payer: Self-pay | Admitting: Internal Medicine

## 2010-08-01 DIAGNOSIS — E785 Hyperlipidemia, unspecified: Secondary | ICD-10-CM

## 2010-08-01 DIAGNOSIS — I2510 Atherosclerotic heart disease of native coronary artery without angina pectoris: Secondary | ICD-10-CM

## 2010-08-01 DIAGNOSIS — I10 Essential (primary) hypertension: Secondary | ICD-10-CM

## 2010-08-01 DIAGNOSIS — R109 Unspecified abdominal pain: Secondary | ICD-10-CM

## 2010-08-01 DIAGNOSIS — E119 Type 2 diabetes mellitus without complications: Secondary | ICD-10-CM

## 2010-08-01 LAB — GLUCOSE, CAPILLARY: Glucose-Capillary: 113 mg/dL — ABNORMAL HIGH (ref 70–99)

## 2010-08-01 NOTE — Discharge Summary (Signed)
[name], NACHTIGAL NO.:  [id number]   MEDICAL RECORD NO.:  [id number]          PATIENT TYPE:  INP   LOCATION:  8527                         FACILITY:  [facility]   PHYSICIAN:  Evette Doffing, M.D.  DATE OF BIRTH:  [birth year]   DATE OF ADMISSION:  10/03/2008  DATE OF DISCHARGE:  10/07/2008                               DISCHARGE SUMMARY   DISCHARGE DIAGNOSES:  1. Acute-on-chronic diarrhea.  2. Elevated lipase/questionable pancreatitis.  3. Hypokalemia.  4. Hypomagnesemia.  5. Acute renal insufficiency.  6. Coronary artery disease status post multiple percutaneous      transluminal coronary angioplasties.  7. Nongap metabolic acidosis.  8. Diabetes mellitus.  9. Hypertension.  10.History of hypercholesterolemia.  11.History of cerebrovascular accident remote found incidentally on      MRI in 2002.  12.Chronic elevated erythrocyte sedimentation rate.  13.History of anemia.  14.History of gastroesophageal reflux disease.  15.Status post hysterectomy.  16.Status post cholecystectomy.  17.Status post ventral hernia repair.  18.Status post lumbar laminectomy.  19.Status post ileal colectomy with anastomosis.  20.Questionable history of Crohn disease.  21.Major depressive disorder.  22.History of arthralgias with a negative room workup in November      2002.   DISCHARGE MEDICATIONS WITH DOSES:  1. Plavix 75 mg 1 tablet by mouth once a day.  2. Aldactone 25 mg 1 by mouth 2 times a day.  3. Catapres 0.2 mg 1 tablet by mouth 3 times a day.  4. WelChol 625 mg 3 tablets twice a day.  5. Atenolol 50 mg 1 tablet by mouth 2 times a day.  6. Fexofenadine 180 mg 1 tablet by mouth once a day as needed.  7. Hydroxyzine 50 mg 1 tablet by mouth once a day as needed.  8. Nitrostat 0.4 mg sublingual 1 tablet sublingually as needed for      chest pain x3.  9. Aspirin 81 mg 1 tablet by mouth once a day.  10.Norvasc 10 mg 1 tablet by mouth once a day.  11.Iron 325 mg 1  tablet by mouth 3 times a day.  12.Metformin 500 mg 2 tablets twice a day.  13.Lipitor 20 mg 1 tablet by mouth once a day.  14.Glucotrol 10 mg 2 tablets by mouth 2 times a day.  15.Vitamin B12 injections started in March 2010 per Dr. Carlean Purl,      Gastroenterology, was to be injected intramuscularly daily for 1      week and then once every week for 4 weeks and then once monthly      thereafter.  16.Magnesium oxide 400 mg 1 tablet by mouth 2 times a day.  17.Potassium chloride 10 mEq take 4 tablets by mouth once a day.  18.Creon 6000 units, pancrelipase, take 2 tablets by mouth 3 times a      day with meals.   DISPOSITION AND FOLLOW UP:  The patient has a followup appointment with  Dr. Luvenia Starch on Friday, November 05, 2008, at 3:00 p.m.  The patient  is to have a basic metabolic panel drawn prior to that appointment to  evaluate her electrolyte status.  The patient also has a followup  appointment that was made on her behalf and request with the [facility].  They will be  calling the patient to send her nutrition packet and the appointment is  set for November 25, 2008, at 12:30 in the afternoon.  Medical records  will be faxed, and the patient was notified of this appointment, date  and time at her home telephone number and was advised that the [facility] would be  in contact with her.   PROCEDURES PERFORMED:  Peripherally inserted central catheter placed due  to lack of the access.   ADMITTING HISTORY AND PHYSICAL:  [name] is a 65 year old woman with  a past medical history of chronic diarrhea, diabetes mellitus,  hypertension, hyperlipidemia, and coronary artery disease who presented  with a chief complaint of diarrhea, nausea, and vomiting.  She was  evaluated in the [facility] ED the day prior to admission for similar  symptoms without significant  findings on routine labs aside from mild  renal insufficiency and sent home with Lomotil and Phenergan.  She  called the [facility] this morning and said that she  could not keep anything down, is occasionally dizzy when she stands.  She was instructed to return to the ED given her lab results of renal  insufficiency the day prior and appearing to have orthostatic symptoms  per the patient report.  The patient states that she chronically has had  loose stools for the past decade.  She reports the change in the  consistency of her stool is just from loose stools as many as 8 per day  to watery diarrhea about 7 days prior to admission.  This was followed  by nausea and vomiting consisting of anything she ate about 5-6 days  prior to admission and subsequently started having symptoms of  orthostasis such as dizziness upon standing about 4 days prior to  admission but that had worsened the day prior to admission after being  sent home from the [facility] Emergency Department.   PHYSICAL EXAMINATION:  VITAL SIGNS:  On admission, temperature 98.6,  heart rate 117, blood pressure 147/84, respiratory rate of 18, oxygen  saturation of 98% on room air.  GENERAL:  Alert, overweight, cooperative exam.  HEENT:  Head, normocephalic and atraumatic.  Eyes, vision grossly  intact.  Pupils equal, round, and reactive to light.  No injection and  anicteric.  Mouth, pharynx pink and dry with no erythema, no exudates.  NECK:  Supple, full range of motion with no thyromegaly, no JVD, and no  carotid bruits.  LUNGS:  Normal respiratory effort.  No accessory muscle use.  Normal  breath sounds.  No crackles, no wheezes.  HEART:  Tachycardic, regular rhythm with no murmur, rub, or gallop.  ABDOMEN:  Soft, nontender with normal bowel sounds.  No distention, no  guarding, no rebound tenderness, no hepatomegaly, and no splenomegaly.  EXTREMITIES:  There is no cyanosis, clubbing, or edema.   NEUROLOGIC:  Alert and oriented x3 with cranial nerves II through XII  intact.  Strength normal in all extremities with sensation intact to  light touch and gait was normal.  SKIN:  Decreased turgor with remote midline epigastric scar.  PSYCH:  Oriented x3 with memory intact for recent and remote.  Normally  interactive with good eye contact.  She  was not anxious or depressed  appearing.   LABORATORY DATA:  Urinalysis was negative apart from being cloudy in  color and having protein of 100.  She did have 3-6 white blood cells  with rare squamous epithelial cells as well, and hyaline casts.  CBC,  white blood cell count of 13.3, hemoglobin of 12.5, hematocrit of 37.8,  platelets of 368 with an MCV of 83.0, absolute neutrophil count of 8.3,  and absolute lymphocyte count of 4.3.  An I-stat showed an ionized  calcium of 1.25, bicarb 18, hemoglobin of 13.9, hematocrit 41, sodium  142, potassium 4.0, chloride 116, BUN of 48, creatinine of 1.6, and  glucose of 191.  Of note the day prior to admission when she was seen in  the [facility] ED, basic metabolic panel showed a sodium of 134,  potassium of 5.0, chloride of 105, bicarb of 18, BUN of 51, and  creatinine of 1.78 with a glucose of 283, total bilirubin of 0.4, alk  phos 99, AST 28, ALT 19, total protein of 9.8, albumin of 4.3, and  calcium of 9.9, and a lipase of 47.  Shortly after admission, the  patient's lipase was repeated and was found to be 104, magnesium 1.6,  phosphorus 2.7.  T4 of 10.1, TSH of 0.658, T3 of 82.6.   HOSPITAL COURSE:  1. Acute-on-chronic diarrhea.  The patient has had an extensive workup      in the past for this chronic diarrhea, which she reports having      gone on for more than a decade.  Prior studies have included the      following found during a review of her medical records studies that      have been done on the patient starting on February 24, 2001, a small      bowel follow-through showed no specific  abnormalities, no evidence      of Crohn disease.  On April 16, 2001, a CT abdomen with contrast      was unremarkable and it was status post a cholecystectomy.  Another      CT of the pelvis with contrast done on the same day was      unremarkable CT.  On June 20, 2001, another CT of the abdomen with      and without contrast was unremarkable.  On July 08, 2001, there      was an MRA of the abdomen with contrast which was normal.  On      January 07, 2002, an enterocleisis with contrast was status post      resection of the ileum and cecum.  There was no evidence of      Crohn's.  There was a prominence number of folds in the jejunum      which was questionable for vasculitis versus surgical changes.  On      January 21, 2002, there was a CT of the abdomen and pelvis with      contrast which showed a questionable anastomotic breakdown with      some fluid surrounding the anastomosis site status post      ileocolectomy with anastomosis done by Dr. Georgina Quint in      September 2003.  As for the questionable history of Crohn ileitis,      several tissue specimens have been taken with both colonic biopsies      as well as a surgical pathology from her ileocolectomy and  anastomosis which did not confirm the presence of Crohn's.  The      patient has strongly positive p-ASCA testing in the past, which had      raised a question of Crohn disease at that time.  The patient had      been tried on Entocort and Pentasa with no relief of her symptoms,      although in one of her previous discharge summaries when she was      started on Remeron following a consultation from Psychiatry, her      symptoms did improve but did return shortly thereafter.  During      this hospitalization, the patient likely had a change in her bowel      habits secondary to a gastroenteritis.  The patient was afebrile      throughout the hospitalization and following fluid resuscitation.      Her tachycardia  resolved and her blood pressures remained stable.      Throughout hospitalization, the patient's diarrhea improved and was      back to her baseline prior to discharge.  2. Elevated lipase/questionable pancreatitis.  The patient had a      lipase of 47 the day prior to admission at the [facility]      Emergency Department.  Lipase was repeated upon her return to the      [facility] Emergency Department the day of admission, which was      found to be slightly elevated.  Upon the results of this test,      pancreatitis appeared to be somewhat unlikely given the patient's      lack of tenderness to palpation and no etiology that could be      easily identified given her history of being status post      cholecystectomy and no history of alcohol abuse and no recent      changes in her medications.  The patient was treated conservatively      for this regardless and was made n.p.o. while being fluid      resuscitated, which was originally done with normal saline and      transition to D5 half-normal saline.  The patient was transitioned      then to clear liquids once her nausea and vomiting and diarrhea had      begun to resolve.  This had to be stopped because her nausea had      returned as well as her diarrhea and she was made n.p.o. once more      and the following days once the patient was feeling like she had      improved, once again she was given clear liquid diet once again and      was advanced as tolerated and was on a full diet prior to      discharge.  Of note, a repeat lipase was ordered on October 06, 2008,      and was found to be 49 once again in the normal range.  3. Hypokalemia.  This was a chronic issue for the patient likely      secondary to diarrhea.  This was repleted both intravenously while      the patient was n.p.o. and orally.  Once the patient was      transitioned back to her regular diet, this was followed with basic      metabolic panels throughout the  hospitalization and dosing was  adjusted with her potassium as needed.  The patient was placed on      telemetry for her hypokalemia and this was resolved prior to the      patient's discharge.  4. Hypomagnesemia.  The patient was also hypomagnesemic also possibly      due to her chronic and acute-on-chronic diarrhea.  This was      repleted with magnesium oxide and IV magnesium while the patient      was n.p.o.  The patient was sent out with a magnesium oxide      prescription given her presentation with hypomagnesemia being      likely secondary to her diarrhea.  5. Anemia.  The patient was slightly anemic throughout the      hospitalization with values in the high 10s for hemoglobins.  This      was monitored throughout the hospitalization and remained fairly      stable and no workup was performed at that time.  6. Diabetes mellitus.  The patient was continued on sliding scale      insulin, and her capillary blood glucoses were monitored throughout      the hospitalization.  The patient was returned to her home dose of      diabetic medications before discharge.  7. Hypertension.  After being fluid resuscitated, the patient had      slightly elevated blood pressures.  Once the patient was      transitioned back to her normal diet, she was restarted on her      medications and her blood pressures were fairly well managed from      that point onward in her hospitalization.   DISCHARGE LABORATORIES AND VITAL SIGNS:  Vitals on day of discharge were  as follows:  Temperature 98.6, heart rate 71, respiratory rate 20, blood  pressure 141/71, oxygen saturation 98% on room air.   Labs on day of discharge, basic metabolic panel sodium of 040, potassium  3.3, chloride 108, bicarb 22, BUN of 6, creatinine of 0.84, glucose of  172, and calcium of 9.3.  CBC, white blood cell count of 9.9, hemoglobin  of 10.2, hematocrit of 29.9, platelets 267 with MCV of 83.2.  Magnesium  1.6.       Luvenia Starch, MD  Electronically Signed      Evette Doffing, M.D.  Electronically Signed    ME/MEDQ  D:  10/22/2008  T:  10/22/2008  Job:  [id number]   cc:   [place], MD,FACG  [facility] at [facility]

## 2010-08-01 NOTE — Assessment & Plan Note (Signed)
[facility]                            CARDIOLOGY OFFICE NOTE   NAME:[name], [name]                   MRN:          [id number]  DATE:11/28/2006                            DOB:          Mar 16, 1946    [name] returns today for followup. She has had multiple complex procedures  to her proximal LAD and diagonal. Her last one involved a stent to the  ostium of the LAD by Dr. Albertine Patricia in April of 2006. She has been on  continuous aspirin and Plavix. Risk factors include; hypercholesteremia,  hypertension, diabetes. She is followed up with Dr. Melvyn Novas . In regards to  her diabetes she does cheat from time to time. She only checks her  sugars 2 to 3 times per week at home. Her hemoglobin A1C apparently has  been in the 7 range. I talked to her about need for a stricter diet and  to get her hemoglobin A1C in the 6 range.   Her hypertension has been well controlled. She has been on some  polypharmacy which has been adjusted by Dr. Melvyn Novas.   She takes lovastatin for her hypercholesterolemia. She is a nonsmoker.   [name] has never had classic chest pain. When she came in with a small  subendocardial MI in 2006 she had burping and thought she had GI  distress. Because she is a woman, is diabetic, has had multiple  procedures, and atypical symptoms I try to do a stress test on her every  year. In talking to her she has had some left hip pain which needs  further investigation. She tends not to be able to walk on a treadmill  and we will order an adenosine Myoview.   REVIEW OF SYSTEMS:  Remarkable for some dyspnea, it appears functional.  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  There is no wheezing. There is no cough or sputum production. She has  normal LV function. Review of systems otherwise negative.   Her medications include:  1. Plavix 75 a day.  2. Potassium 20 t.i.d.  3. Atenolol 50 a day.  4. Aspirin a day.  5. Aldactone 50 a  day.  6. Cyclobenzaprine 10 a day.  7. Amlodipine 5 a day.  8. Lovastatin 20 a day.  9. Clonidine 0.2 t.i.d.   PHYSICAL EXAMINATION:  Remarkable for a healthy-appearing middle aged  black female in no distress. Affect is appropriate but quiet. Her blood  pressure is 130/88, pulse is 82 and regular. Her weight is equal to 199,  respiratory rate is 14. She is afebrile.  HEENT: Normal. Carotids normal without bruits. There is no  lymphadenopathy, no thyromegaly. No JVP elevation.  LUNGS: Clear, good diaphragmatic motion. No wheezing.  There is an S1, S2 with distant heart sounds. PMI is not palpable.  ABDOMEN: Benign, no tenderness. Bowel sounds positive. No triple A. No  hepatosplenomegaly. No hepatojugular reflux.  Femorals are +2 bilaterally and deep. There are no bruits. PT s are +3  bilaterally. There is no lower extremity edema.  NEURO: Nonfocal. There is a faint peripheral neuropathy  below the  ankles. There is no muscular weakness.   EKG shows sinus rhythm with nonspecific ST-T wave changes.   IMPRESSION:  1. Coronary artery disease, previous Rotablator with atherectomy and      stenting of the ostial LAD, follow up Myoview in the next few      weeks. Continue aspirin and beta blocker.  2. Hypertension, severe. No evidence of renal artery stenosis by      previous catheterization. Continue multiple current medications and      low salt diet.  3. Hypercholesterolemia, continue lovastatin follow up liver and lipid      profile for Dr. Melvyn Novas in 6 months.  4. Left hip pain, probably related to orthopedic problems. She has      good circulation in her legs. I told her she should follow up with      Dr. Melvyn Novas. If she were to need any significant surgery for her hip I      recommended Dr.  Wynelle Link.  5. Diabetes, suboptimal control, continue to see nutritionist. Aim for      hemoglobin A1C in the 6 range. Consider testing sugar daily as      opposed to 2 to 3 times per week.  6.  Question hypokalemia in the setting of hypertension, I do not know      if she has renal tubular acidosis or some other problem. Dr. Melvyn Novas      will hopefully look into this more but it strikes me as a little      odd that the patient requires potassium 3 times a day and aldactone      to maintain a normal potassium level.   Overall [name] is doing well, I will see her back in 6 months if her  Myoview is non ischemic.     Wallis Bamberg. Johnsie Cancel, MD, [facility]  Electronically Signed    PCN/MedQ  DD: 11/28/2006  DT: 11/29/2006  Job #: [id number]

## 2010-08-01 NOTE — Assessment & Plan Note (Signed)
[facility]                            CARDIOLOGY OFFICE NOTE   NAME:[name], Katie [name]                   MRN:          [id number]  DATE:11/25/2007                            DOB:          Apr 14, 1945    [name] returns today for followup.  She is referred back by Dr.  Lucy Chris.  She has a history of a relatively refractory high blood  pressure, diabetes, and hypercholesterolemia.  She has had known  coronary artery disease.  She has had a drug-eluting stent placed to the  ostial LAD.   She had previous TCA and angioplasty of a diagonal branch in 1996 and  subsequent Rotablator in 2000.  She presented back in 2006 with a  subendocardial MI and was found to have significant disease in the  proximal LAD.  She also had a distal 90% apical LAD lesion.  We have a  plan on keeping her on Plavix indefinitely due to her multiple risk  factors and the proximal nature of her LAD stent.   She has had a Myoview last year, which showed only a minimal area of  apical ischemia, which may have been due to the distal LAD lesion.  Medical therapy was warranted.  She is currently not having chest pain.  Blood pressure seems to be under better control.  Her sugars has not  been ideally controlled.  We discussed this at length in regards to her  risk for renal failure and retinopathy.  Her diet seems to be a little  bit of an issue here.  She is on metformin, but continues to have fairly  high blood sugars in the morning.  She will follow with Dr. Selinda Flavin for  this.  She may be a good candidate for the addition of Januvia.   REVIEW OF SYSTEMS:  Otherwise negative.   MEDICATIONS:  1. She is on Plavix 75 a day.  2. Aspirin a day.  3. Potassium 20 t.i.d.  4. Atenolol 50 b.i.d.  5. Aldactone 50 a day.  6. Clonidine 0.2 t.i.d.  7. Amlodipine 10 a day.  8. Iron.  9. Metformin 1 g b.i.d.  10.Lipitor 10 a day.  11.Glipizide 5 b.i.d.   PHYSICAL EXAMINATION:   GENERAL:  Remarkable for an overweight black  female, in no distress.  VITAL SIGNS:  Blood pressure is 130/60, pulse 75 and regular,  respiratory rate 14, afebrile, and weight 196.  HEENT:  Unremarkable.  NECK:  Carotids are normal without bruit.  No lymphadenopathy,  thyromegaly, or JVP elevation.  LUNGS:  Clear, good diaphragmatic motion.  No wheezing.  S1 and S2.  Normal heart sounds.  PMI not palpable.  ABDOMEN:  Protuberant.  No bruit.  No hepatosplenomegaly or  hepatojugular reflux.  No tenderness.  EXTREMITIES:  Distal pulses are intact.  No edema.  NEUROLOGIC:  Nonfocal.  SKIN:  Warm and dry.  No muscular weakness.   EKG shows sinus rhythm with nonspecific ST-T wave changes.   IMPRESSION:  1. Coronary artery disease, previous stent to the ostial to proximal      left anterior descending.  Continue  aspirin, Plavix, and beta-      blocker.  She has nitroglycerin in case she needs it, not having      significant chest pain.  2. Hypertension, currently well controlled on the slew of medication.      Previous workup for renal artery stenosis negative.  Continue low-      salt diet.  3. Diabetes, poor control.  Follow with Dr. Selinda Flavin.  Consider addition      of Januvia.  Hemoglobin A1c goal 6.5 or less.  4. Hypercholesteremia in the setting of coronary artery disease.      Continue statin drug.  Lipid and liver profile in 6 months.   Overall, I think her cardiac status is stable.  I will see her back in a  year.     Wallis Bamberg. Johnsie Cancel, MD, [facility]  Electronically Signed    PCN/MedQ  DD: 11/25/2007  DT: 11/26/2007  Job #: [id number]

## 2010-08-02 NOTE — Progress Notes (Signed)
  Subjective:    Patient ID: [name], female    DOB: [birth year], 65 y.o.   MRN: [id number]  HPI  Please see the A&P for the status of the pt's chronic medical problems.   Review of Systems  Constitutional: Negative for activity change and fatigue.  HENT: Negative for congestion, sore throat, rhinorrhea, sneezing and sinus pressure.   Eyes: Negative for itching.  Respiratory: Negative for cough and shortness of breath.   Cardiovascular: Positive for leg swelling. Negative for chest pain.  Gastrointestinal: Negative for nausea, vomiting, diarrhea and constipation.  Musculoskeletal: Positive for joint swelling and arthralgias.  Neurological: Negative for weakness.       Objective:   Physical Exam  Vitals reviewed. Constitutional: She is oriented to person, place, and time. She appears well-developed and well-nourished. No distress.  HENT:  Head: Normocephalic and atraumatic.  Right Ear: External ear normal.  Left Ear: External ear normal.  Nose: Nose normal.  Eyes: Conjunctivae are normal.  Neck: Neck supple. No tracheal deviation present.  Cardiovascular: Normal rate, regular rhythm and normal heart sounds.   No murmur heard. Pulmonary/Chest: Effort normal and breath sounds normal.  Musculoskeletal: She exhibits edema. She exhibits no tenderness.  Neurological: She is alert and oriented to person, place, and time.  Skin: Skin is warm and dry. She is not diaphoretic.  Psychiatric: She has a normal mood and affect. Her behavior is normal. Judgment and thought content normal.          Assessment & Plan:

## 2010-08-03 NOTE — Assessment & Plan Note (Signed)
She has had no flares in regard to her abdominal discomfort.

## 2010-08-03 NOTE — Assessment & Plan Note (Signed)
She continues to see her cardiologist. She is asymptomatic. She is on indefinite aspirin and Plavix. She also is on a beta blocker.

## 2010-08-03 NOTE — Assessment & Plan Note (Signed)
BP Readings from Last 3 Encounters:  08/01/10 140/74  05/23/10 139/65  05/04/10 152/75   Her blood pressure ranges from normal to high. He has always done this. Had a recent office visits with her eye doctor, her diastolic was 435 but she cannot remember her systolic. She is on three drug therapy and the ACEI can be increased at her next appt if her blood pressure remains elevated.

## 2010-08-03 NOTE — Assessment & Plan Note (Signed)
[name] is now only on Lantus and Glucotrol. Metformin had to be stopped secondary to her creatinine. Her A1c increased from 6-7.1 following the medication change. Her A1c is still within reasonable limits. I did tell her that if her A1c continued to rise then I would need to start prandial insulin. I encouraged her to eat healthier and exercise more now that the weather has improved. This may prevent escalation of her insulin regimen.  She is on an ACE inhibitor so microalbumin is not needed.  She is on a statin. I reviewed her cholesterol results. Her LDL is controlled although her triglycerides remain elevated they have significantly decreased when she was on the statin.  Her last eye exam was over 12 months. She agreed to have a retinal scan in our clinic today and Barnabas Harries completed this exam.

## 2010-08-03 NOTE — Assessment & Plan Note (Addendum)
LDL is controlled at 72. Her triglycerides remain slightly elevated but they have significantly trended down on WelChol. Continue her statin and WelChol.

## 2010-08-04 NOTE — Assessment & Plan Note (Signed)
[facility]                               PULMONARY OFFICE NOTE   NAME:[name], [name]                   MRN:          [id number]  DATE:10/01/2005                            DOB:          Jun 20, 1945    This is a primary service/comprehensive health care evaluation.   HISTORY:  A 65 year old black female, never a smoker with morbid obesity  complicated by hypertension, hyperlipidemia, comes in with worsening dyspnea  to the point where she is having trouble getting from building to the next  without giving out.  She has no history previously of CHF but does have a  history of ischemic heart disease status post stent most recently in the LAD  in April 2006.  However, she denies any exertional chest pain, orthopnea,  PND or leg swelling, TIA or claudication symptoms, or significant  variability in her dyspnea with weather and environmental changes.   Despite stating that she has gone to the nutritionist with no recommended  diet modifications she has not been able to lose any weight over the last  year.   PAST MEDICAL HISTORY:  Significant for remote hysterectomy, laminectomy and  small bowel resection.  She also carries a diagnosis of myelodysplasia  followed by Dr. Marin Olp on Procrit shots.  GERD by upper endoscopy dated  January 06, 2002.   MEDICATIONS:  Medications taken in detail on the work sheet correlating with  the medication calendar she carries with her.   FAMILY HISTORY:  Positive for heart disease in her mother, breast cancer in  her mother also.  No colon cancer in her direct family.  One maternal  grandmother had diabetes.  Maternal aunt had diabetes as well.   SOCIAL HISTORY:  She has never smoked.  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  Denies any alcohol use.   REVIEW OF SYSTEMS:  Taken in detail and significant for increasing thirst  and diarrhea for years for which she has already seen Dr. Carlean Purl and  Dr.  Fuller Plan.   PHYSICAL EXAMINATION:  GENERAL:  This is an obese ambulatory white female  with no change in her height at 5 foot 3-1/2 inches, weight of 196 pounds,  which is also no change from a year ago.  HEENT:  Reveals upper dentures in place. Oropharynx is clear.  No evidence  of excessive postnasal drainage or cobblestoning.  Ear canals clear  bilaterally.  NECK:  Supple without cervical adenopathy or tenderness.  Carotid upstrokes  brisk without any bruits.  BREASTS:  Without masses, nipple or skin changes. No axillary nodes.  LUNGS:  Lung fields are perfectly clear bilaterally to auscultation and  percussion.  CARDIAC:  There is regular rate and rhythm without murmur, gallop or rub  present.  ABDOMEN:  Soft and obese but benign without palpable organomegaly, masses,  or tenderness.  EXTREMITIES:  Warm without calf tenderness, cyanosis, clubbing or edema.   Chest x-ray revealed mild cardiomegaly.  No infiltrates or effusions.  EKG  revealed sinus rhythm with inferior non-specific ST-T wave changes.   LABORATORY DATA:  Included a BNP of 26.  Normal TSH.  An LDL cholesterol of  68 with an HDL of 39.  Urinalysis revealed a specific gravity of 1.025 with  no significant glycosuria, trace proteinuria however.  Hematocrit was 35.9%.  Fasting blood sugar was 165.   IMPRESSION:  1.  Unexplained dyspnea on exertion with no evidence of  air flow      obstruction nor congestive heart failure, probably related to obesity      with deconditioning.  2.  Morbid obesity now complicated by fasting hyperglycemia and polyuria.      Rather than starting treatment with oral hypoglycemics I am going to ask      her to recommit to the concept of weight loss through diet and exercise      and see her back more frequently, namely I have asked her to schedule an      appointment in four weeks for fasting blood sugar repeat and hemoglobin      A1C on that visit as well as to discuss the fact that  she now has new      onset diabetes with her nutritionist.  3.  Hyperlipidemia adequate control on present regimen.  No evidence of      adverse side effects.  4.  Chronic diarrhea.  In this setting it is difficult for me to understand      how she cannot lose weight.  She has also had problems with hypokalemia,      which I suspect is related to diarrhea but it is corrected now on the      combination of  potassium and spironolactone.  5.  Myelodysplasia, maintained on erythropoietin per Dr. Marin Olp.  6.  Hiatal hernia with reflux documented in October 2003 not being actively      treated.  7.  Hypertension adequate control on present regimen, however she is      complaining of mild daytime hypersomnolence.  I believe that is probably      from the clonidine,  which I have asked her to use one in the morning      and one in the evening and then one at bedtime to reduce the daytime      amount of clonidine exposure.  I have also asked her when she is thirsty      to drink water rather than any sugar containing liquids.  8.  General health maintenance.  She gets her mammograms through her      gynecologist.  Her tetanus and Pneumovax were updated today.  Followup      will be in four to six weeks with fasting blood sugar, hemoglobin A1C on      that visit.                                   Christena Deem. Melvyn Novas, MD, [facility]   MBW/MedQ  DD:  10/01/2005  DT:  10/02/2005  Job #:  [id number]

## 2010-08-04 NOTE — Cardiovascular Report (Signed)
NAMENILEY, HELBIG NO.:  [id number]   MEDICAL RECORD NO.:  [id number]          PATIENT TYPE:  INP   LOCATION:  3707                         FACILITY:  [facility]   PHYSICIAN:  Satira Sark, M.D. LHCDATE OF BIRTH:  [birth year]   DATE OF PROCEDURE:  DATE OF DISCHARGE:                              CARDIAC CATHETERIZATION   PRIMARY CARE PHYSICIAN:  Legrand Como B. Melvyn Novas, M.D.   CARDIOLOGIST:  [name], M.D.   INDICATIONS FOR PROCEDURE:  Ms. Goatley is a 65 year old woman with a known  history of coronary artery disease status post previous directional  arthrectomy to the proximal left anterior descending with subsequent  percutaneous intervention (angioplasty) to the diagonal branch most recently  by Dr. Olevia Perches in August 2000. She presents now with symptoms consistent with  unstable angina and has mildly abnormal troponin I levels up to 0.18. She is  now referred for a diagnostic coronary angiography to assess for progressive  atherosclerosis and revascularization strategies.   PROCEDURE:  1.  Left heart catheterization.  2.  Selective coronary angiography.  3.  Left ventriculography.  4.  Selective renal angiography.   ACCESS AND EQUIPMENT:  The area about the right femoral artery was  anesthetized with 1% lidocaine and a 6 French sheath was placed in the right  femoral artery via the modified Seldinger technique. Standard preformed 6  [place] JL-4 and JR-4 catheters were used for selective coronary angiography  and an angled pigtail catheter was used for left heart catheterization and  left ventriculography. All exchanges were made over wire and the patient  tolerated the procedure well without immediate complications. Of note, the  renals were selectively injected using the JR-4 catheter.   HEMODYNAMICS:  Left ventricle 212/60 mmHg, aorta 214/96 mmHg.   ANGIOGRAPHIC FINDINGS:  1.  The left main coronary artery is free of significant __________ coronary     atherosclerosis.  2.  The left anterior descending is a medium caliber vessel with a large      bifurcating proximal diagonal branch and two smaller distal branches.      There is approximately 80% hypodense stenosis in the proximal left      anterior descending followed by 40% vessel stenosis, 30% distal stenosis      and 40% apical stenosis. The large proximal bifurcating diagonal branch      has a 50% ostial stenosis and 50% proximal stenosis but no major flow      limiting disease.  3.  The circumflex coronary artery is a medium to large caliber vessel with      four obtuse marginal branches. The first obtuse marginal branch has a      30% stenosis and there are other minor Luminal irregularities within the      circumflex proper including a 20% mid vessel stenosis.  4.  The right coronary artery is a dominant vessel that is medium in      caliber. There is 20% proximal vessel stenosis and a 50% mid vessel      stenosis.  5.  Selective renal angiography was performed showing  patent bilateral renal      arteries without significant flow limiting obstruction.  6.  Left ventriculography is performed in the RAO projection revealing an      ejection fraction greater than 55% in the setting of ventricular ectopy      with 1+ mitral regurgitation. There also looks to be some degree of      apical hypokinesis.   DIAGNOSES:  1.  Single vessel obstructive coronary artery disease of the left anterior      descending in the proximal vessel presumably at or near the previous      arthrectomy site. The proximal diagonal branch has disease that is      nonobstructive. No other major flow limiting disease is noted in the      remaining coronaries.  2.  Patent bilateral renal arteries.  3.  __________ ejection fraction greater than 55% with apical hypokinesis      and 1+ mitral regurgitation in the setting of ectopy. Left ventricular      diastolic pressure is 16 mmHg.  4.  Systemic  hypertension.   RECOMMENDATIONS:  I reviewed the films with Dr. Albertine Patricia. The plan will be to  proceed with percutaneous intervention to address the left anterior  descending stenosis with medical therapy otherwise anticipated.      SGM/MEDQ  D:  06/30/2004  T:  06/30/2004  Job:  [id number]   cc:   [name], M.D.   Christena Deem. Melvyn Novas, M.D. [facility]

## 2010-08-04 NOTE — H&P (Signed)
NAME:  [name], [name]                           ACCOUNT NO.:  [id number]   MEDICAL RECORD NO.:  [id number]                   PATIENT TYPE:  AMB   LOCATION:  DAY                                  FACILITY:  [facility]:  Devin M. Ciliberti, M.D.            DATE OF BIRTH:  May 25, 1945   DATE OF ADMISSION:  11/10/2001  DATE OF DISCHARGE:                                HISTORY & PHYSICAL   HISTORY OF PRESENT ILLNESS:  The patient is a 65 year old, G5, P5, who  presented to my office after being noted to have a pelvic mass on CT scan.  The patient was having abdominal pain and was seeing Dr. Terance Hart for this  secondary to her Crohn's ileitis.  The mass showed an irregular shape.  The  patient was then referred over for this problem.  The patient was seen in  the office and had CA125 and CEA drawn, which were noted to be normal.  The  ultrasound did show a solid 4 x 5 cm right ovarian mass.  The patient had no  change in bowel or bladder habits, fevers, nausea, vomiting, chills, or  other problems.   PAST MEDICAL HISTORY:  1. Hypertension.  2. Coronary artery disease.  3. Crohn disease.  4. Hypercholesterolemia.  5. Noninsulin-dependent diabetes mellitus.   PAST SURGICAL HISTORY:  1. Gallbladder.  2. Total abdominal hysterectomy.  3. Three angioplasties.   MEDICATIONS:  1. Entocort 9 mg a day.  2. Aldactazide/hydrochlorothiazide 25 mg a day.  3. Toprol XL.  4. Methyldopa.  5. K-Dur.   ALLERGIES:  No known drug allergies.   SOCIAL HISTORY:  Without any tobacco, alcohol, or drugs.   FAMILY HISTORY:  Mother with breast cancer.   PHYSICAL EXAMINATION:  VITAL SIGNS:  The blood pressure today is noted to be  140/80.  HEENT:  Throat clear.  LUNGS:  Clear to auscultation bilaterally.  HEART:  Regular rate and rhythm.  ABDOMEN:  Soft, nontender.  No palpable masses.  PELVIC:  The vaginal vault is clear.  No mass palpable secondary to obesity.   ASSESSMENT AND PLAN:  A  65 year old, G5, P5, with right solid adnexal mass  suspicious for fibroma.  The patient will undergo a laparoscopic bilateral  salpingo-oophorectomy.  The patient was explained the risks of the  procedure, including bleeding, transfusion, HIV, hepatitis, infection, wound  breakdown, scar tissue, fistula formation, damage to internal organs, the  possibility for needing to make an open incision due to damage to internal  organs or unable to remove the ovary or mass safely, pulmonary embolisms,  myocardial infarction, or potentially death.  The patient gives [place]  permission for that.  Blood clots could develop while under anesthesia.  The  patient stands that she understands these risks will decide to go ahead an  proceed with the procedure.  Devin M. Ciliberti, M.D.    DMC/MEDQ  D:  11/10/2001  T:  11/10/2001  Job:  [id number]

## 2010-08-04 NOTE — Assessment & Plan Note (Signed)
[facility]                               PULMONARY OFFICE NOTE   BRAYLEA, BRANCATO                          MRN:          [id number]  DATE:  11/12/2005                              DOB:      05/02/1945    HISTORY OF PRESENT ILLNESS:  This is a 65 year old black female with morbid  obesity now complicated by overt diabetes.  She previously had low grade  proteinuria, but I assumed it was rated a hypertension, but now has blood  sugars consistently above 140.  This patient reports chronic diarrhea as  another complaint and I had been concerned for years that she was not  following a reasonable dietary or exercise program for which I referred to  nutrition Belenda Cruise saw her at nutrition but the patient did not have any  plans to follow up with a food diary yet).   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   MEDICATIONS:  Please see face sheet, November 12, 2005.  Correct as listed.   PHYSICAL EXAMINATION:  GENERAL:  She is an ambulatory black female with a  bell indifference affect and attitude.  VITAL SIGNS:  She is afebrile, normal vital signs.  Blood pressure is  130/80, pulse rate 70 and regular.  HEENT:  Unremarkable.  Pharynx clear.  LUNGS:  Lung fields clear bilaterally.  CARDIAC:  Regular rhythm without murmur, gallop, or rub.  ABDOMEN:  Soft, benign.  EXTREMITIES:  Warm without calf tenderness, cyanosis, clubbing.   IMPRESSION:  1. Overt diabetes chemically but not symptomatic.  She is not symptomatic.      At this point I am going to check a hemoglobin A1c and insist that the      patient return with a food diary to see Belenda Cruise, her nutritionist,      within the next two weeks.  2. Complex polypharmacy.  I have reviewed each and every one  of her      medicines, emphasizing that her lifestyle is to a large extent why she      is taking so many medications and that rather than add an oral      hypoglycemic at this point, I am going to ask her to work on lifestyle      modification and return to see our nurse practitioner within the next      six weeks for full medication reconciliation and generation of a new      medication calendar.  In the meantime, I went over each and every one      of the recommendations I had previously made in writing with her here      in the office.   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                                   Christena Deem. Melvyn Novas, MD, [facility]   MBW/MedQ  DD:  11/12/2005  DT:  11/12/2005  Job #:  [id number]

## 2010-08-04 NOTE — H&P (Signed)
NAMESUEKO, DIMICHELE NO.:  [id number]   MEDICAL RECORD NO.:  [id number]          PATIENT TYPE:  EMS   LOCATION:  MAJO                         FACILITY:  [facility]   PHYSICIAN:  [name], M.D.     DATE OF BIRTH:  [birth year]   DATE OF ADMISSION:  06/28/2004  DATE OF DISCHARGE:                                HISTORY & PHYSICAL   This is a 65 year old patient of Legrand Como B. Melvyn Novas, M.D. [facility].  The patient has  been seen by myself many years ago, possibly 7.  She tells me she has a  history of catheterization and angioplasty about 10 years ago.  There are no  records in echart and I could not find a folder on her in the  catheterization lab.   I do recall seeing the patient in the office many years ago.  She is a  pleasant African-American lady who has significant hypertension.  Apparently, Dr. Melvyn Novas has started her on some new medications last week.  On  review of her multiple echart records for her GI problems, shows that she  tends to be hypokalemic.  I am not sure if she has had a workup for  hyperaldosteronism.   She also has a vague history of an elevated sed rate.  She has been having  increasing substernal chest pain with exertion over the last week.  It was  particularly bad tonight while she was driving.  It was relieved in the ER  with nitroglycerin.   In general, she does not get chest pain.  She has not had any recent  cardiology follow-up or stress test.   REVIEW OF SYSTEMS:  Remarkable for chronic bowel problems.  She has some  chronic pain and loose stools.  However, previously she had significant  diarrhea with electrolyte abnormalities and a small bowel obstruction.  She  has not had these types of problems in the last 3 years.   FAMILY HISTORY:  Noncontributory.   MEDICATIONS:  Listed on the order sheet.  Interestingly, her potassium is  3.1 despite not being on a loop diuretic and being on Aldactone.   PAST MEDICAL HISTORY:  Otherwise  remarkable for a hysterectomy,  hypertension, multiple small bowel problems from colitis.   ALLERGIES:  DARVOCET, VALIUM; they just make her sick.   PHYSICAL EXAMINATION:  VITAL SIGNS:  Blood pressure 148/88, pulse 70 and  regular.  LUNGS:  Clear.  NECK:  Carotids are normal.  HEART:  There is an S1 and S2 with a soft, systolic murmur.  ABDOMEN:  Benign.  There are multiple scars.  Distal pulses are intact with  no edema.   EKG shows sinus rhythm with nonspecific ST T wave changes.   LABORATORY DATA:  As indicated, her creatinine is 0.8, potassium 3.1.  Initial point of care enzymes were negative.  Hematocrit was 39 and troponin  was 0.6.   IMPRESSION:  Ms. 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  She is not a complainer.  I am  somewhat  concerned that she has recurrent chest pain 10 years after an  intervention.  We will have to try to find her catheterization records  either on microfilm or somewhere in another catheterization folder whose  location I am unaware of.  I talked to her and her daughters about heart  catheterization and they agree to proceed.  I think that the likelihood of  recurrent disease is high after 10 years.  We will continue her current  blood pressure medicines which were just changed.  We will supplement her  potassium now and in the morning and recheck it and hopefully get it above  3.5.   Since she has a vague history of an elevated sed rate, we will check one in  the morning.   Since she has not had close cardiology follow-up, we will check a two-  dimensional echocardiogram for LV function and also for her soft systolic  murmur.  As an outpatient, Dr. Melvyn Novas may need to further workup  hyperaldosteronism as the cause of her hypertension, particularly since her  potassium was low and she was on the potassium-sparing diuretic.   The risks of catheterization including bleeding, stroke, need for  emergency  surgery, diarrhea, __________ were discussed and the patient agrees to  proceed.  For the time being she is comfortable and I will just maintain her  on aspirin.  I do not think she needs intravenous heparin at this time.   She is on Atenolol 25 b.i.d. for her hypertension and we will continue this.      PN/MEDQ  D:  06/29/2004  T:  06/29/2004  Job:  [id number]

## 2010-08-04 NOTE — H&P (Signed)
[facility]  Patient:    [name], [name] Visit Number: [id number] MRN: [id number]          Service Type: GON Location: GYN Attending Physician:  Alvino Chapel Dictated by:   Rodolph Bong, M.D. Admit Date:  05/13/2001 Discharge Date: 05/13/2001                           History and Physical  CHIEF COMPLAINT:  Pelvic mass.  HISTORY OF PRESENT ILLNESS:  This patient is a 65 year old G 5, P 5, who is referred by Dr. Ermalene Searing. Philip Aspen secondary to a pelvic mass noted on a CAT scan.  The patient was noted to have abdominal pain and was sent by Dr. Philip Aspen for this scan, secondary to potential Crohns ileitis, and showed a 5.0 cm irregular pelvic mass.  The patient has been getting treated by Dr. Philip Aspen for this problem, and was referred over to [facility], due to this mass.  On ultrasound in the office, the patient was noted to have a 4.0 cm x 4.0 cm complex mass in the right adnexa.  There was no evidence of ascites or any other abnormality.  The patient had a CA-125 and CEA drawn which were both in the normal range. The patient was sent over for a gynecology/oncology evaluation.  At this point the patient has opted to undergo a surgical exploration as opposed to following the mass with ultrasound.  PAST MEDICAL HISTORY 1. Significant for hypertension. 2. Coronary artery disease. 3. Crohns disease. 4. Hypercholesterolemia. 5. Non-insulin-dependent diabetes mellitus.  PAST SURGICAL HISTORY 1. Gallbladder. 2. Total abdominal hysterectomy. 3. Three angioplasties in the past.  CURRENT MEDICATIONS 1. Entocort 9 mg q.d. 2. Aldactazide. 3. Hydrochlorothiazide 25 mg q.d. 4. Toprol XL. 5. Methyldopa. 6. K-Dur.  ALLERGIES:  No known drug allergies.  SOCIAL HISTORY:  Without any tobacco, alcohol, or drugs.  FAMILY HISTORY:  Mother with breast cancer.  PHYSICAL EXAMINATION  VITAL SIGNS:  Blood pressure 132/76.  HEENT:   Throat clear.  LUNGS:  Clear to auscultation bilaterally.  HEART:  A regular rate and rhythm.  ABDOMEN:  Soft, nontender.  No palpable masses.  Positive bowel sounds.  PELVIC:  Vaginal vault is clear.  No palpable masses noted due to obesity.  ASSESSMENT/PLAN:  This 65 year old gravida 5, para 5, with a right pelvic mass, complex.  The patient is opting for a surgical evaluation.  The patient will undergo an exploratory laparotomy, bilateral salpingo-oophorectomy.  The patient was explained the risks of the procedure, including bleeding, transfusion, HIV or hepatitis infection, wound breakdown, scar tissue, fistula formation, damage to the internal organs, blood clots, strokes, pulmonary embolisms, myocardial infarction, and potentially death.  The patient will be given stress dose steroids during the course of her surgery.  She will be kept on Entocort during her stay.  The patient had no other concerns at this point. She is scheduled for the June 04, 2001. Dictated by:   Rodolph Bong, M.D. Attending Physician:  Alvino Chapel DD:  06/02/01 TD:  06/02/01 Job: [id number]

## 2010-08-04 NOTE — H&P (Signed)
NAME:  [name], [name]                           ACCOUNT NO.:  [id number]   MEDICAL RECORD NO.:  [id number]                   PATIENT TYPE:  INP   LOCATION:  5003                                 FACILITY:  [facility]   PHYSICIAN:  Sandy Salaam. Deatra Ina, M.D. [facility]          DATE OF BIRTH:  1945/04/11   DATE OF ADMISSION:  01/18/2002  DATE OF DISCHARGE:                                HISTORY & PHYSICAL   CHIEF COMPLAINT:  Intractable nausea and vomiting.   HISTORY OF PRESENT ILLNESS:  This is a 65 year old African American female,  known to Dr. Loralee Pacas. [name] for history of diarrhea.  He has labeled  this Crohn's disease, this mostly based on the fact that she has had a  chronically elevated sed rate and a strongly positive tASCA test, however,  actual biopsies have not confirmed Crohn's disease.  She was just  hospitalized January 04, 2002 through January 10, 2002 on the surgical  service because of nausea, vomiting and diarrhea.  She has undergone  multiple abdominopelvic surgeries, which are listed below.  Most recently on  the 23rd of September, she underwent exploratory laparotomy secondary to an  adhesive small-bowel obstruction.  She had ileocolectomy, anastomosis and  lysis of adhesions at that time.  The pathology from the ileum did not  confirm Crohn's ileitis, however; neither has pathology confirmed Crohn's  disease on biopsies performed October of 2002 on the colon and terminal  ileum when she had colonoscopy.  In any event, the patient's diarrhea  improved during this recent hospitalization using Lomotil up to three times  daily and has continued to be well-controlled following discharge.  Unfortunately, the nausea and vomiting have persisted.  For a period of  maybe 10 days after this recent discharge, she was able to keep down some  solid foods, however, she was still intermittently having nausea and  vomiting.  In the past few days, she has developed vomiting to the point  where she cannot even keep liquids down.  She denies abdominal pain.  Stools  are about once or twice a day and soft and brown.  She is not having any  coffee-grounds emesis, just green and/or yellow bilious emesis; also the  food and liquid she tries to consume also come up.  When she went home, she  was started on a 10-day course of ciprofloxacin 500 mg b.i.d.; this was for  a UTI.  Urine culture had grown out Klebsiella as well as an enterococcal  species.  During that admission, she had also been treated for hypokalemia.  Renal service had seen her for acute renal insufficiency with a BUN as high  as 26 and creatinine of 4.7.  This resolved with final markers of 8 and 1.1,  respectively.  She has had stable anemia with hemoglobin hovering in the 8-  to-10 range.  Anemia studies confirmed only a low iron saturation.  Initial  abdominal x-rays had suggested a very early partial small-bowel obstruction.  She did have a small bowel enteroclysis on the 22nd of October and this  showed she was status post resection of significant amount of ileum and she  was status post cecal resection with patent anastomosis; no obstruction, no  dilation and no evidence for Crohn's disease.  There was some prominence  within the jejunal folds suggesting a mural small bowel process such as  vasculitis, bowel hemorrhage or possibly changes secondary to prior  surgeries.  She had an upper endoscopy by Dr. Norberto Sorenson T. Fuller Plan on the 21st  of October.  This showed reflux esophagitis, a hiatal hernia and benign-  appearing gastric polyp.  The esophagitis was deemed to be mild.  Duodenal  biopsy showed no active inflammation or villous atrophy and the stomach  showed benign gastric mucosa with no H. pylori, intestinal metaplasia or  active inflammation identified.   Because of the patient's refractory nausea and vomiting, Dr. Deatra Ina  evaluated her in the emergency room and admitted her.  Blood pressure was  stable.   She was slightly tachycardic at a rate of 111 but afebrile.  Initial lab testing will be dictated below but did show a slight elevation  of her white blood cell count at 13,000, hemoglobin of 9.7, a potassium low  at 2.8.  Acute abdominal series showed no evidence for obstruction or  perforation, right upper quadrant calcifications of uncertain etiology and  some right lower quadrant opacities possibly from residual contrast.  Chest  film was negative for anything acute.   Dr. Deatra Ina admitted her for IV fluids and further management of idiopathic  nausea and vomiting.   Note that on review of the old charts from her recent hospitalization, the  patient has had small-bowel follow-through in December of 2002 which showed  a possible slight delay in the gastric emptying but no evidence for Crohn's  disease.  She had been tried by Dr. Philip Aspen on Entocort for at least four  months and Pentasa for several months, both of which made no difference as  to the status of her diarrhea.  There was some question as to whether she  had bile salt enteropathy.  Questran had not helped much.  She has a long-  term history of hypokalemia.  She has chronically elevated sed rate in the  90s to low 100s.  She has also had elevated hemoglobin A1c's to 7.2 in the  past year.  Testing for VMA has been within normal limits.  Testing for  urine catecholamine showed a moderate increase but not enough to rule the  patient in for a pheochromocytoma.  Urine metanephrines were also within  normal limits.  Stool fat studies were within normal limits in October of  2002.   The patient does say that the symptoms were better while she was on the  Cipro and that they definitely got worse a couple of days after the Cipro  finished and that in the past she has noticed this as well, that when she  uses antibiotics, her nausea and vomiting have been relieved.   PAST MEDICAL HISTORY: 1. Coronary artery disease.  She has  undergone angioplasties in 1996, 1997     and August of 2000.  2. Hypertension.  3. Morbid obesity.  4. Status post back surgery x2.  5. Chronic rhinitis.  6. Anemia.  7. Status post abdominal hysterectomy.  8. Status post laparoscopic bilateral salpingo-oophorectomy in August of  2003 with lysis of adhesions that were attaching themselves to the ileum     and sigmoid.  9. Status post ventral hernia repair.  10.      Questionable history of Crohn's ileitis, again, no tissue     documentation to support this.  11.      Small-bowel obstruction, status post ileocolectomy with anastomosis     and lysis of adhesions, December 09, 2001.  12.      Remote history of lupus, having been told she had this condition by     Dr. Viona Gilmore. Buddy Duty several years ago.  The patient says that her symptoms     were that of arthralgia.  13.      Status post cholecystectomy.   ALLERGIES:  She is allergic to DARVON, CODEINE, DARVOCET and VALIUM.   MEDICATIONS:  1. Toprol-XL 50 mg p.o. every day.  2. Cozaar 50 mg p.o. every day.  3. Potassium/K-Dur, dose and dosing unknown.  4. Imodium p.r.n. up to three times daily.   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  She does not smoke or drink.  She has adult children  and some grandchildren living at her home with her.  She is divorced.   FAMILY HISTORY:  Her mother died with metastatic breast cancer to the lung,  does not know her father's medical history as he abandoned the family.  There is, in siblings, history of coronary artery disease, MI, diabetes  mellitus, type 2, and hypertension.   REVIEW OF SYSTEMS:  NEUROLOGIC:  The patient denies any tremor or weakness  in her legs, which she was having during her last hospitalization.  Has not  had any confusion, headaches or difficulty with her vision.  ENT:  No sore  throat,  no dental pain.  PULMONARY:  No cough, no shortness of breath, no  pleuritic pain.  CARDIOVASCULAR:  No palpitations.  No chest pain.  No  peripheral edema.  GU:  Has been urinating adequately without frequency or  dysuria.  GI:  As above.  ENDOCRINE:  The patient again does have a history  of elevated hemoglobin A1c and describes herself as a borderline diabetic.  She does not take any glucose-controlling medications.  No night sweats.  MUSCULOSKELETAL:  The patient does describe back pain that started about  three or four days prior to admission that comes on when she vomits and that  starts in the base of her spine and travels up the spine.  She also  occasionally has joint swelling in her hands and knees but this is not  causing her any pain or difficulty at the moment.  DERMATOLOGIC:  No sores,  no rashes, no pruritus.  GENERAL:  Has lost weight, probably 20 or more  pounds, in the last several weeks.  PSYCHIATRIC:  Patient denies history of either past or current depression.  She is not having any mood swings,  denies tearful episodes.  The patient has no difficulty concentrating.  Affect is not depressed.   PHYSICAL EXAMINATION:  GENERAL:  The patient is a pleasant, nontoxic, but  not comfortable-appearing African American.  She is actively retching.  She  is also hiccuping.  VITAL SIGNS:  Blood pressure 144/74, temperature 98.1, pulse 111,  respirations 20.  HEENT:  Sclerae are nonicteric.  Conjunctivae are pink.  Oropharynx is  clear.  The tongue is slightly dry.  NECK:  There is no JVD, masses or thyromegaly.  No bruits.  CHEST:  Chest is clear to auscultation and percussion bilaterally.  COR:  There is a slightly tachycardic rhythm with no murmurs, rubs, or  gallops.  PMI is not displaced.  ABDOMEN:  Abdomen is soft with hypoactive bowel sounds.  There is no  distention.  No masses, hepatosplenomegaly or tenderness.  No herniae  evident.  RECTAL:  Deferred.  GU:   Deferred.  BREASTS:  Deferred.  MUSCULOSKELETAL:  No swelling of the joints of the hands, knees or feet.  No  gross joint deformities.  NEUROLOGIC:  The patient is alert and oriented x3.  No tremor.  She is an  excellent historian.  PSYCHOLOGICAL:  The patient does not appear depressed, she is not tearful.   LABORATORY DATA:  White blood cell count 13, hemoglobin 9.7, hematocrit 28.6  and MCV 81.1, platelets 311,000.  PT 15.5, INR 1.2.  PTT is 27.  Sodium 139,  potassium 2.8, glucose 125, chloride 97, CO2 27.  BUN 8, creatinine 1.5.  Total bilirubin 0.8, alkaline phosphatase 131.  AST 41, ALT 59.   IMPRESSION:  1. Intractable nausea and vomiting of unclear etiology, rule out     gastroparesis.  At present, there is no evidence for bowel obstruction.     The diagnosis of Crohn's disease has not been supported by any tissue     diagnosis or visible mucosal findings on endoscopies.  She did have mild     esophagitis during her recent hospitalization and medication list does     not include any stomach-protective medicine, but doubt that mild     esophagitis would be causing her current severe symptoms.  Rule out     medication-induced vomiting, though the one medication that was in     question, methyldopa, which she was using during prior hospitalization,     had been discontinued.  Her liver function tests are up slightly so we     may have to pursue a biliary etiology to her current symptoms.  Her     gallbladder has been removed.  Other etiologies may be endocrine in     origin.  She does have this questionable history of lupus in the past,     which does not seem to have caused any chronic problems other than her     arthralgias.  There was also that mention on the enteroclysis of some     prominent jejunal folds raising a question of a process such as     vasculitis.  In any event, she needs to be admitted for symptomatic     control of her nausea and vomiting and intravenous  fluids.  Need to also    rule out urinary tract infection as she did improve on ciprofloxacin     which was used to treat a polymicrobial urinary tract infection.  2. Hypokalemia, needs supplementation.  3. Glucose intolerance with history of elevated glycosylated hemoglobin, so     technically is a type 2 diabetic, but she does not appear to have severe     medication-requiring hyperglycemia.  4.     Recent history of acute renal insufficiency.  Right now, her BUN and     creatinine are within normal limits.  5. History of hypertension, requiring adjustment of medications during prior     hospitalization.  Blood pressure right now is under  good control.  She     has had extensive workup for pheochromocytoma in the past couple of     years.     Gerre Couch, P.A. [facility]                    Robert D. Deatra Ina, M.D. [facility]    SG/MEDQ  D:  01/20/2002  T:  01/20/2002  Job:  [id number]

## 2010-08-04 NOTE — H&P (Signed)
NAME:  [name], [name]                           ACCOUNT NO.:  [id number]   MEDICAL RECORD NO.:  [id number]                   PATIENT TYPE:  INP   LOCATION:  0103                                 FACILITY:  [facility]   PHYSICIAN:  Lowella Bandy. Olevia Perches, M.D. [facility]            DATE OF BIRTH:  [birth year]   DATE OF ADMISSION:  12/08/2001  DATE OF DISCHARGE:                                HISTORY & PHYSICAL   CHIEF COMPLAINT:  Acute abdominal pain, nausea and vomiting x3 days.   HISTORY:  The patient is a pleasant 65 year old African-American female  known to Dr. Glendale Chard, primary patient of Dr. Melvyn Novas who has a  questionable history of Crohn ileocolitis is status post a remote  hysterectomy, ventral hernia repair, and cholecystectomy.  She also has a  history of coronary artery disease and is status post angioplasty x3.  The  patient had been evaluated for complaints of abdominal pain and diarrhea  last Fall and underwent colonoscopy which was unremarkable.  Biopsies were  also negative.  A small-bowel follow-through was negative as well.  Nevertheless, she was complaining of some weight loss and diarrhea and was  treated with __________ with some improvement in her symptoms.  CT scan of  the abdomen and pelvis done at that time did show evidence of a 5 cm right  adnexal mass for which surgery eventually was decided on.  She underwent  laparoscopic bilateral salpingo-oophorectomy with Devin M. Ciliberti, M.D.  on November 12, 2001.  The mass was found to be a benign fibroma.  She also  had lysis of adhesions at that time.  Apparently, she has done well  postoperatively until December 05, 2001 when she began with lower abdominal  pain which she describes as being wave-like and constant.  This is  associated with nausea and vomiting which has been intractable.  She has  been unable to keep down any p.o. over the past three days.  She has not had  any documented fever or chills, no bowel movement since  September 19th.  She  had come to the emergency room on September 20th and was seen by the ER M.D.  and noted to have an elevated white count of 16,000.  KUB was done showing  no evidence of obstruction but possible early ileus.  She was discharged  with Percocet and Phenergan and asked to follow up with GI today.  She has  remained ill through the weekend, unable to keep down p.o., and when she  called the office this morning she was advised to come back to the emergency  room for further evaluation.  She was noted to be diffusely tender in her  abdomen with rebound, quiet bowel sounds and guarding, and is admitted for  further diagnostic evaluation.   CURRENT MEDICATIONS:  1. Phenergan 25 mg q.6 h. p.r.n.  2. Percocet q.4-6 h. p.r.n.  3. Toprol-XL 50  mg q.d.  4. K-Dur 8 tablets per day, she is uncertain of the dosage.  5. Cozaar 50 mg q.d.  6. Methyldopa 500 mg q.d.   ALLERGIES:  No known drug allergies.   PAST MEDICAL HISTORY:  As outlined above.   SOCIAL HISTORY:  The patient is single and employed full-time.  She does  have a granddaughter who lives with her.  No tobacco, no EtOH.   FAMILY HISTORY:  Pertinent for breast cancer in her mother.  Negative for GI  disease.   REVIEW OF SYSTEMS:  CARDIOVASCULAR:  Currently denies and chest pain or  anginal symptoms.  PULMONARY:  Negative for cough, shortness of breath, or  sputum production.  She does complain of increased abdominal pain with  inspiration.  GENITOURINARY:  Negative.  GYN:  The patient has noticed a  slight amount of yellowish oozing from her suprapubic laparoscopy port  today.  GI:  As above.   PHYSICAL EXAMINATION:  GENERAL:  A well-developed African-American female,  ill-appearing, alert and oriented.  VITAL SIGNS:  Temp is 97.2, blood pressure 173/82, pulse 82, respirations  24.  HEENT:  Nontraumatic, normocephalic.  EOMI, PERRLA.  Sclerae anicteric.  Bronchial mucosa is dry.  CARDIOVASCULAR:  Tachycardia,  regular rhythm with S1 & S2.  LUNGS:  Clear to A&P.  ABDOMEN:  Somewhat distended.  Bowel sounds are absent.  She is diffusely  tender with guarding and rebound.  There is no palpable mass or  hepatosplenomegaly.  Below her laparoscopy port in the suprapubic region  does have some surrounding erythema and odor.  There is no current discharge  note.  RECTAL:  Heme-negative without mass.  EXTREMITIES:  No clubbing, cyanosis, or edema.  NEUROLOGICAL:  Grossly nonfocal.   LABORATORY DATA:  WBC 15.5, hemoglobin 12.4, hematocrit 36.  Remainder are  pending.  KUB is pending today.   IMPRESSION:  31. A 65 year old African-American female with three-week history of three     weeks status post laparoscopic bilateral salpingo-oophorectomy and lysis     of adhesions for a benign right ovarian mass and with three-day history     of acute abdominal pain, nausea and vomiting, rule out obstruction,     ileus, perforation, or a possible pelvic abscess.  2. Status post hysterectomy, cholecystectomy, then ventral hernia repair.  3. Questionable diagnosis of Crohn ileocolitis.  4. Coronary artery disease status post angioplasty x3.  5. Hypertension.  6. History of hypokalemia.   PLAN:  The patient is admitted to the service of Dr. Delfin Edis for IV  fluid hydration, labs, plain abdominal films, CT scan of the abdomen and  pelvis with contrast.  She will be covered with IV Unasyn, kept NPO.  Will  ask GYN to follow her up and may need surgical consultation depending on  above findings.  For further details please see the orders.     Amy Esterwood, P.A.-C. [facility]                Dora M. Olevia Perches, M.D. [facility]    AE/MEDQ  D:  12/08/2001  T:  12/08/2001  Job:  [id number]   cc:   Gwenette Greet. Ciliberti, M.D.  [address] [place]  [place] [address]  Fax: [phone]

## 2010-08-04 NOTE — Op Note (Signed)
[name], [name]                          ACCOUNT NO.:  [id number]   MEDICAL RECORD NO.:  [id number]                   PATIENT TYPE:  AMB   LOCATION:  DAY                                  FACILITY:  [facility]   PHYSICIAN:  John T. Clarene Essex, M.D.                 DATE OF BIRTH:  1945-07-21   DATE OF PROCEDURE:  11/12/2001  DATE OF DISCHARGE:  11/12/2001                                 OPERATIVE REPORT   SURGEON:  Jenny Reichmann T. Clarene Essex, M.D.   ASSISTANTChristen Bame M. Ciliberti, M.D.   PREOPERATIVE DIAGNOSIS:  Adnexal mass.   POSTOPERATIVE DIAGNOSIS:  Pelvic adhesions, right ovarian fibroma.   PROCEDURE:  Laparoscopic bilateral salpingo-oophorectomy.   ANESTHESIA:  General endotracheal.   INDICATIONS FOR SURGERY:  This 65 year old woman presented with a  symptomatic right adnexal mass, characterized by mixed solid/cystic  components on ultrasound and normal CA125.  On serial ultrasound  examination, this had persisted for several months and the patient had  symptoms of right pelvic pain.  Surgery had previously been scheduled and  postponed, due to hypokalemia; potassium was 3.5 obtained on the day of  surgery.  The patient was post prior hysterectomy.  Examination under  anesthesia confirmed a mobile and smooth-walled 4 cm mass in the right  adnexa.  At the time of laparoscopy there was no evidence of metastatic  disease or upper abdominal abnormalities, other than adhesions from prior  surgeries.  There were adhesions between distal ileum and the vaginal cuff,  and sigmoid colon to the left tube and ovary; which were closely normal  otherwise.  Arising from the right ovary was a 4 cm solid tumor,  characterized as an ovarian fibroma by frozen section.   DESCRIPTION OF PROCEDURE:  The patient was prepped and draped in the low  lithotomy position, using direct placement stirrups.  Marcaine was used to  pre-inject all port sites.  A transverse subumbilical incision was made with  a scalpel,  fascia entered vertically and peritoneal cavity accessed.  The  umbilical port was placed and secured to the fascia with interrupted 0  Vicryl.  Pneumoperitoneum was established and the laparoscope advanced,  revealing the findings described above.  The patient was placed in deep  Trendelenburg position.  Two lateral 5 mm trocars were placed under direct  visualization, just lateral to the inferior epigastric vessels.  A 12 mm  trocar was placed suprapubically under direct visualization.  Using graspers  and Endo shears, and the harmonic scalpel for hemostasis, adhesions between  the distal ileum and the vaginal cuff were lysed.  Adhesions between the  left ovary and appendices epiploicae of the sigmoid colon were likewise  lysed.  Findings were as described above and were documented via  photographs.   The right round ligament was elevated and transected with the harmonic  scalpel.  The broad ligament lateral to the adnexal structures and  infundibulopelvic ligament was opened linearly, and the ureter was  identified in its retroperitoneal position.  The infundibulopelvic ligament  was skeletonized above the ureter, and a window created between the adnexal  structures and the ureter.  The harmonic scalpel was used to control the  right infundibulopelvic ligament hemostatically and to divide the ovarian  vessels.  Additional attachments to the vaginal cuff were likewise divided  using the harmonic scalpel.  A similar procedure was performed on the left  side, elevating the round ligament, transecting it with the harmonic  scalpel, opening the broad ligament peritoneum lateral to the adnexal  structures, identifying the ureter and isolating the adnexal structures  above the ureter.  The left infundibulopelvic ligament was controlled  hemostatically and divided with the harmonic scalpel; attachments of the  adnexa to the vaginal cuff were divided with the harmonic scalpel.  Throughout, both  ureters were preserved and visualized.  An Endo catch bag  was advanced into the abdominal cavity.  The left adnexa were placed into  the bag first, followed by the right adnexa.  The bad was elevated to the  level of the suprapubic skin incision, and the right adnexal structures were  moved, using blunt dissection to morcellate the structures.  Frozen section  was obtained and subsequently returned positive for a right ovarian fibroma,  benign.  The left adnexal structures were submitted separately for  histologic analysis.   The pelvis was irrigated with several liters of normal saline and inspected  for hemostasis.  Additional hemostasis was achieved where necessary using  the harmonic scalpel.  A small amount of oozing was noted with abdominal  pressures of 5 mmHg, originating from the right adnexa; Intercede was placed  as a barrier and to assist in hemostasis.  Because this was minimal, it was  elected to terminate the procedure.  Pneumoperitoneum was deflated, trocars  removed, and the subumbilical and suprapubic fascial incisions were closed  with running 0 Vicryl.  All trocar sites were irrigated and skin closed with  running subcuticular 3-0 Vicryl, reinforced with Steri-Strips.  The patient  tolerated the procedure well and was returned to the recovery room in stable  condition.   ESTIMATED BLOOD LOSS:  50 cc.   TRANSFUSIONS:  None.   DRAINS:  Foley catheter to dependent drainage.   COUNTS:  Sponge, needle and instrument count is correct.                                               John T. Clarene Essex, M.D.    JTS/MEDQ  D:  11/13/2001  T:  11/14/2001  Job:  [id number]   cc:   Caswell Corwin, R.N.   Devin M. Ciliberti, M.D.  [address] [place]  [place] [address]  Fax: [phone]

## 2010-08-04 NOTE — H&P (Signed)
NAME:  [name], [name]                           ACCOUNT NO.:  [id number]   MEDICAL RECORD NO.:  [id number]                   PATIENT TYPE:  INP   LOCATION:  5727                                 FACILITY:  [facility]   PHYSICIAN:  Danton Sewer, M.D. [facility]              DATE OF BIRTH:  Aug 15, 1945   DATE OF ADMISSION:  03/05/2002  DATE OF DISCHARGE:                                HISTORY & PHYSICAL   CHIEF COMPLAINT:  Nausea, vomiting, and diarrhea x 5 days.   HISTORY OF PRESENT ILLNESS:  This is a very complicated 65 year old African-  American female patient of Dr. Melvyn Novas followed for primary care medical  problems including difficult-to-control hypertension, dyslipidemia, and  diabetes mellitus.  She presented for acute office visit today related to a  five-day history of intermitted nausea and vomiting, and she is currently  tolerating only liquids.  She also has associated loose watery stools, on  average 4 to 5 a day.  She has had a very complicated medical history over  the last four months requiring multiple hospitalizations for abdominal  surgery, abdominal pain, intractable nausea and vomiting, and diarrhea.  Initially the patient had a laparoscopic bilateral salpingo-oophorectomy for  a benign adrenal mass.  This was done by Dr. Dwana Curd on 11/12/2001.  She  then developed intractable nausea and vomiting, abdominal pain on 12/08/2001  requiring hospitalization.  X-ray did confirm a small-bowel obstruction.  She underwent a laparotomy by Dr. Georgina Quint with subsequent  ileocolectomy and anastomosis with lysis of adhesions on 12/09/2001.   She then was readmitted to the hospital on 01/04/2002 through 01/10/2002  with recurrent nausea, vomiting, diarrhea, with upper endoscopy revealing  mild reflux esophagitis, hiatal hernia, and a benign-appearing gastric  polyp. She had an extensive GI workup with mild improvement in symptoms with  Lomotil and Questran; however, she returned shortly  after discharge on  November 11 requiring hospitalization through November 13 for dehydration,  nausea, vomiting, and diarrhea.  If you will refer back to this  hospitalization, there is a detailed Discharge Summary specifying the exact  procedures and findings.  In summary, there was no GI etiology found to  contribute to this intractable nausea and vomiting.  She did seem to respond  shortly after starting Remeron for depression that was initiated by Dr.  Rhona Raider via psychiatry consult.  She was discharged home and states she  was doing fairly well up until five days ago.   She had returned back to work, and her appetite had improved without any  nausea or vomiting.  She continued to have loose stools but significantly  decreased in frequency.  However, she continued to have significant weight  loss with approximately a 50-pound weight loss over the last four months.  She does deny any headache, rash, fever, joint swelling, chest pain,  shortness of breath, hematochezia, or bloody stools.  She does have  occasional  dizziness, especially with standing and sudden movements, and she  has not taken any solid food in five days, only liquids.  The patient was  not orthostatic in the office today and was discharged home with Phenergan  and Lomotil and a clear liquid diet.  However, labs revealed a significantly  elevated white blood count at 23,000.  Her BUN and creatinine were 31 and  2.9, respectively.  She was hypokalemic with a potassium level of 2.3.  In  view of this and her extensive history, she will require hospitalization for  IV hydration and further evaluation.   PAST MEDICAL HISTORY:  1. Difficult-to-control hypertension with previous evaluation per Dr. Hassell Done.     There as no renal artery stenosis per MRA in April 2003.  A 24-hour urine     was negative for catecholamines and metanephrine.  She was also ruled out     for pheochromocytoma, negative for VMA.  Negative for primary      hyperaldosteronism.  2. Chronic diarrhea with questionable Crohn's ileitis.  The patient had     negative biopsies per colonoscopy but has had a strongly positive pASCA.     The patient has been on multiple trials of medications in the past     including Entocort, Pentasa, Colazal without improvement.  3. Chronic hypokalemia, previously on K-Dur 20 mEq up to 2 tablets 4 times a     day.  4. Anemia.  5. Coronary artery disease status post PTCA in November 1985 and august     2000.  6. Chronic low back pain status post lumbar laminectomy per Dr. Sherwood Gambler.  7. Chronic rhinitis.  8. Hyperlipidemia.  9. Polyarthritis with chronically elevated sed rates as high as 126.  The     patient has had a previous evaluation per Dr. Charlestine Night of rheumatology in     November 2002 which was found to be negative for rheumatological disease     or connective tissue disease.  10.      Diet-controlled diabetes mellitus.  11.      Renal insufficiency during last hospitalization in November 2003.  12.      Major depression with most recent psychiatry consult per Dr.     Rowe Pavy during the November hospitalization.  At that time, Prozac was     discontinued, and Remeron was started at 7.5 mg.  13.      Gastroesophageal reflux disease per endoscopy 01/06/2002.  14.      Old cerebrovascular accident per MRI 01/2002.  It was negative for     mass, lesions, or neoplasm (looking for a culprit of the intractable     nausea and vomiting).   PAST SURGICAL HISTORY:  1. Partial hysterectomy in 1995.  2. Cholecystectomy.  3. Ventral hernia repair.   MEDICATIONS:  1. Toprol XL 150 mg daily.  2. Cozaar 50 mg b.i.d.  3. K-Dur 20 mEq 2 tablets q.i.d.  4. Multivitamins daily.  5. Reglan 10 mg t.i.d., a.c., and q.h.s.  6. Clonidine 0.1 mg q.d.  7. Remeron 15 mg 1/2 tablet daily.  8. Lomotil or Imodium p.r.n.   ALLERGIES:  VALIUM and DARVON.  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  She is a never-smoker.  She denies any alcohol or drug  use.   REVIEW OF SYSTEMS:  Essentially negative except as noted above.   PHYSICAL EXAMINATION:  GENERAL:  The patient is a pleasant black female in  no acute distress.  VITAL SIGNS:  Temperature 97.0, blood pressure 122/76, standing blood  pressure 118/70, pulse 88, O2 saturation 99% on room air.  Weight 160  pounds.  This is down 47 pounds since August 2003.  HEENT:  Normocephalic.  PERRLA.  Conjunctivae not injected.  Sclerae  nonicteric.  Nasal mucosa pink and moist.  TMs are normal.  Buccal mucosa is  somewhat dry.  NECK:  Supple without cervical adenopathy.  Carotids have equal upstrokes  bilaterally.  No bruits noted.  No thyromegaly or nodules appreciated.  LUNGS:  Clear to auscultation bilaterally.  CARDIAC:  S1, S2 without murmur, rub, or gallop.  PMI is nondisplaced.  ABDOMEN:  Soft with bowel sounds positive throughout all four quadrants.  There are multiple well-healed surgical scars.  There is no guarding or  rebound noted.  No suprapubic tenderness.  Negative CVA tenderness.  EXTREMITIES:  Warm without any cyanosis, clubbing, or edema.  NEUROLOGIC:  Alert and oriented x 3.  The patient does appear somewhat  depressed.   LABORATORY DATA:  White cell count 23,000, hemoglobin 9.9, hematocrit 30.7.  Neutrophils 84,000, segs 85,000.  Sed rate 129.  BUN 31, creatinine 2.9,  potassium 2.3, calcium 7.9, alkaline phosphatase 118.  TSH 1.61.   IMPRESSION AND PLAN:  1. Recurrent nausea, vomiting, and diarrhea with dehydration and     hypokalemia.  The patient will be admitted for IV hydration and potassium     supplementation.  Multiple abdominal films are pending.  Labs including     stool culture and blood cultures are currently pending.  Will follow     accordingly.  Gastrointestinal consult is currently pending.  Gerre Couch, P.A. has been contacted for Dr. [name] to evaluate the      patient.  She may have Phenergan and Lomotil as needed.  2. Hypertension.  Continue on Toprol XL, Cozaar, and Clonidine.  3. Depression.  The patient is to continue on Remeron 7.5 mg daily.  The     patient may need another psychiatry consult for adjustment of medication     dose.  It had been previously suggested that this dose be increased to     possibly maximum of 30 mg daily.  She has not had a followup with     psychiatry since discharge.  She does have an appointment with Dr. Wilder Glade     on Monday, December 22.     Tammy Parrett, P.A. [facility]                   Danton Sewer, M.D. [facility]    TP/MEDQ  D:  03/05/2002  T:  03/06/2002  Job:  [id number]   cc:   Gutierrez. [name], M.D. [facility]  [address] [address]  Fax: 1

## 2010-08-04 NOTE — Consult Note (Signed)
[facility]  Patient:    [name], [name] Visit Number: [id number] MRN: [id number]          Service Type: GON Location: GYN Attending Physician:  Sedonia Small Dictated by:   Madie Reno. Clarene Essex, M.D. Proc. Date: 10/07/01 Admit Date:  10/07/2001 Discharge Date: 10/07/2001   CC:         Rodolph Bong, M.D.  Caswell Corwin, R.N.   Consultation Report  REASON FOR CONSULTATION:  Adnexal mass with multiple comorbidities.  INTERVAL HISTORY:  The patients potassium replacement has been increased. She continues to have mild symptoms and had a follow-up ultrasound confirming a pelvic mass.  Surgical intervention is being considered in August.  HISTORY OF PRESENT DISEASE:  The patient underwent CT scan of abdomen and pelvis in January 2003 for evaluation of possible Crohns disease with an elevated sedimentation rate and a 5 cm pelvic mass was detected.  Ultrasound performed by Dr. Rodolph Bong revealed 4 x 3.6 x 3.7 mass with a small solid component and a mixed echogenic pattern.  CA-125 value was normal and CEA was likewise normal.  The patient has a prior history of abdominal hysterectomy for benign menorrhagia and the patient had been on Premarin in the past.  PAST MEDICAL HISTORY:  Hypertension and coronary artery disease treated with two balloon angioplasties and stent placement.  The patient is symptomatic with dyspnea on exertion after one flight of stairs, but denies angina.  PAST SURGICAL HISTORY::  Significant for TAH, cholecystectomy, hiatal hernia repair, and lumbar laminectomy.  MEDICATIONS:  Premarin, K-Dur, Sular, Toprol-XL, Entocort, aspirin, and Cozaar.  ALLERGIES:  DARVOCET.  FAMILY MEDICAL HISTORY:  Breast cancer in the patients mother.  PERSONAL SOCIAL HISTORY:  The patient denies tobacco or ethanol use.  REVIEW OF SYSTEMS:  Otherwise negative.  PHYSICAL EXAMINATION  VITAL SIGNS:  Weight 211 pounds.  Vital signs stable and  afebrile as recorded in the laboratory flow sheet.  GENERAL:  The patient is moderately obese, in no acute distress.  HEENT:  Negative with clear oropharynx.  NECK:  Supple without thyromegaly.  LYMPH NODE SURVEY:  No supraclavicular or inguinal adenopathy.  LUNGS:  Fields clear.  HEART:  Sounds regular without JVD.  ABDOMEN:  Obese, soft, and benign without mass or organomegaly.  No ascites or hernias are noted.  She has well healed Pfannenstiel and well healed right subcostal incision along with a small well healed upper midline incision in the epigastrium.  BACK:  There is no back or CVA tenderness.  EXTREMITIES:  Full range of motion without edema.  PELVIC:  External genitalia and BUS are normal to inspection and palpation. The vagina is clear without mucosal lesions.  Cervix and uterus are absent. Bimanual and rectovaginal examinations reveal no clear cut adnexal mass or nodularity.  ASSESSMENT:  Persistent adnexal mass, hypertension, coronary artery disease, prior hypokalemia requiring delay of scheduled surgery.  PLAN:  I am in complete agreement with a repeat try at surgical extirpation and agree with Dr. Jory Ee plan to initially consider laparoscopic approach.  It might be feasible to admit the patient overnight for bowel prep so that we can avoid cancellation of her surgery because of hypokalemia.  I discussed risks and benefits of the surgery with the patient and we will schedule preoperative evaluation prior to surgery which is scheduled in August. Dictated by:   Madie Reno. Clarene Essex, M.D. Attending Physician:  Cindie Laroche T DD:  10/07/01 TD:  10/11/01 Job: [id number]

## 2010-08-04 NOTE — Consult Note (Signed)
[facility]  Patient:    [name], [name] Visit Number: [id number] MRN: [id number]          Service Type: OUT Location: PADM Attending Physician:  Nash Dimmer Dictated by:   Madie Reno. Clarene Essex, M.D. Proc. Date: 06/04/01 Admit Date:  05/28/2001 Discharge Date: 05/28/2001   CC:         Rodolph Bong, M.D.  Caswell Corwin, R.N.  Wallis Bamberg. Johnsie Cancel, M.D.   Consultation Report  REASON FOR CONSULTATION:  Adnexal mass; hypokalemia.  HISTORY OF PRESENT ILLNESS:  This patient was diagnosed with a 5 cm pelvic mass on 04/16/01, at the time of CT scan of abdomen and pelvis for evaluation of possible Crohns ileocolitis.  She had a CA125 of 5.2, and there was a moderate amount of fluid in the posterior cul-de-sac.  The patient states that she is asymptomatic from this mass.  Exploratory laparotomy was scheduled.  On 05/28/01, potassium was 2.7.  Her cardiologist office was contacted, and potassium supplementation was increased.  The patient underwent oral bowel prep, and despite increasing her K-Dur, she was noted to have potassium of 2.6 in the preoperative holding area.  PAST MEDICAL HISTORY:  Detailed in Dr. Lillia Carmel previous note. Remarkable for hypertension and coronary artery disease post balloon angioplasty x2.  CURRENT MEDICATIONS: 1. Premarin. 2. K-Dur. 3. Sular. 4. Toprol XL. 5. Endocor. 6. Aspirin.  ALLERGIES:  DARVOCET, producing GI upset.  PHYSICAL EXAMINATION:  GENERAL:  The patient is alert and oriented x3, in no acute distress.  VITAL SIGNS:  Per anesthesia intake sheet.  ASSESSMENT: 1. Asymptomatic 5 cm pelvic mass. 2. Severe hypokalemia, affected by bowel prep.  PLAN:  I had a lengthy discussion with the attending anesthesiologist. Because this patient presents for elective surgery, we felt that the risks of acute intravenous potassium replenishment outweighed the benefits of the surgery, and that she is definitely at  an increased cardiac risk given her current hypokalemia.  Her admission and surgery for today are cancelled.  Her cardiologist will be consulted for recommendation regarding increased potassium supplementation, and we would re-evaluate her in approximately two weeks, with followup potassium.  If her potassium is not above 3 (preferentially greater than 3.5), we would have to rethink the advisability of an exploratory laparotomy in the near future, and might choose to follow her adnexal mass with sequential scans.  If her potassium is greater than 3, we would then consider rescheduling surgery with a preoperative admission for potassium replacement during her bowel prep.Dictated by:   Madie Reno. Clarene Essex, M.D.  Attending Physician:  Nash Dimmer DD:  06/04/01 TD:  06/05/01 Job: [id number]

## 2010-08-04 NOTE — Consult Note (Signed)
NAME:  [name], [name]                           ACCOUNT NO.:  [id number]   MEDICAL RECORD NO.:  [id number]                   PATIENT TYPE:  INP   LOCATION:  5003                                 FACILITY:  [facility]   PHYSICIAN:  Merri Ray. Grandville Silos, M.D.             DATE OF BIRTH:  28-Jun-1945   DATE OF CONSULTATION:  01/23/2002  DATE OF DISCHARGE:                                   CONSULTATION   REASON FOR CONSULTATION:  Evaluate CT scan findings of inflammation around  ileocolonic anastomosis.   HISTORY OF PRESENT ILLNESS:  The patient is a 65 year old [place] American  female with a history of multiple medical problems, who was admitted to  [facility] secondary to nausea and vomiting.  She is status  post exploratory laparotomy with ileocolectomy by Georgina Quint, M.D., on  December 09, 2001.  Part of her work-up has included a CT scan of the  abdomen and pelvic, which demonstrates some inflammation and questionable  extraluminal air around her anastomosis.  There is no contrast extravasation  at that site.  Currently the patient complains of some nausea.  She claims  that she has had some recent vomiting, maybe once this morning.  She has had  two loose bowel movements today.  She denies any abdominal pain or  discomfort.  She claims this nausea has been going on for some time.  She  has no other current complaints.   PAST MEDICAL HISTORY:  Significant for:  1. Coronary artery disease.  2. Hypertension.  3. Obesity.  4. Anemia.  5. Questionable Crohn's ileitis.  6. Possible lupus.   PAST SURGICAL HISTORY:  1. Ileocolectomy in September of this year.  2. Bilateral salpingo-oophorectomy.  3. Abdominal hysterectomy.  4. Ventral hernia repair.  5. Cholecystectomy.   ALLERGIES:  DARVON, CODEINE, DARVOCET, and VALIUM.   MEDICATIONS:  Currently the patient is taking Reglan, Protonix, Lomotil,  Compazine, Toprol XL, and Cozaar.   SOCIAL HISTORY:  No tobacco use  or alcohol use.   FAMILY HISTORY:  Her mother had breast cancer.  She has some siblings with  history of coronary artery disease, diabetes, and hypertension.   REVIEW OF SYMPTOMS:  GENERAL:  She tends to feel a little bit weak.  CARDIOVASCULAR:  She is having no chest pain.  PULMONARY:  She has no  shortness of breath.  GASTROINTESTINAL:  Please refer to the history of  present illness.  MUSCULOSKELETAL:  She claims that she had some back pain  when she was first admitted, but this is now a little bit better.  PSYCHIATRIC:  She denies any complaints.  The review of systems is otherwise  negative.   PHYSICAL EXAMINATION:  VITAL SIGNS:  Temperature 98.5 degrees, pulse rate  98, respirations 20, blood pressure 210/99, saturation 98% on room air.  GENERAL APPEARANCE:  The patient was resting on exam, but aroused easily.  HEENT:  Her pupils are equal and reactive.  NECK:  Supple.  CHEST:  Clear to auscultation bilaterally.  HEART:  Regular rate and rhythm.  ABDOMEN:  Soft, nondistended, and nontender with no palpable masses.  EXTREMITIES:  Warm without any significant edema.  NEUROLOGIC:  The patient is alert and oriented x 3.   IMPRESSION:  1. Nausea and vomiting of unclear etiology.  There is no evidence of     obstruction.  2. CT findings of inflammation at her ileocolonic anastomosis with some     possible extraluminal air.   PLAN:  I discussed this patient and my findings with Gatha Mayer, M.D.  We agreed on proceeding with a small bowel follow through tomorrow morning  to further evaluate her anastomosis.  It is not clear that the inflammation  seen on the CT scan is causing her current symptoms, but we will evaluate  the anastomosis with a small bowel follow through and then Dr. Carlean Purl might  possibly follow the above evaluation with a colonoscopy.   We will follow this patient closely with you.  Thank you for this  consultation.                                                Merri Ray Grandville Silos, M.D.    BET/MEDQ  D:  01/23/2002  T:  01/25/2002  Job:  [id number]

## 2010-08-04 NOTE — Cardiovascular Report (Signed)
[name], [name] NO.:  [id number]   MEDICAL RECORD NO.:  [id number]          PATIENT TYPE:  INP   LOCATION:  3707                         FACILITY:  [facility]   PHYSICIAN:  Ethelle Lyon, M.D. LHCDATE OF BIRTH:  Dec 19, 1945   DATE OF PROCEDURE:  06/30/2004  DATE OF DISCHARGE:                              CARDIAC CATHETERIZATION   PROCEDURE:  Drug-eluting stent placement in the proximal LAD.   INDICATIONS:  Ms. Planck is a 65 year old lady with uncontrolled  hypertension who is status post [facility] of the LAD and balloon angioplasty of  diagonal branch in 1996 and subsequent rotablation and angioplasty of the  diagonal in 2000.  She now presents with a small non-ST elevation myocardial  infarction with troponin of 0.18.  She underwent diagnostic angiography by  Dr. Domenic Polite earlier today.  This demonstrated the culprit lesion to be an  80% stenosis of the proximal LAD.  There was also a 90% stenosis of the most  apical portion of the LAD.  Left ventricular systolic function was  preserved.  I was asked to perform percutaneous revascularization.   PROCEDURAL TECHNIQUE:  Informed consent was obtained.  Via the preexisting  right common femoral arterial sheath, a 6 [place] CLS 3.5 guide was advanced  over a wire and engaged in the ostium of the left main.  Anticoagulation was  initiated with additional heparin to achieve and maintain an ACT of greater  than 200 seconds.  The patient had arrived in the lab after receiving a  single bolus of eptifibatide.  An addition bolus of eptifibatide was  administered.  Pre-intervention Plavix was withheld, as the patient was very  nauseated.  Intent is to give that immediately postprocedure.   A Prowater wire was advanced to the distal LAD without difficulty.  The  lesion was predilated using a 2.5 x 9 mm Maverick at 6 atmospheres.  It was  then stented using a 3.5 x 16 mm CoStar study stent deployed at 16  atmospheres.  The  stent was then postdilated using a 3.75 x 15 mm Maverick  at 18 atmospheres.  Final angiography demonstrated no residual stenosis and  TIMI III flow to the distal vasculature.   The arteriotomy site was then closed using a 6 [place] Angioseal device.  Complete hemostasis was obtained.  She was then transferred to the holding  room in stable condition, having tolerated the procedure well.   COMPLICATIONS:  None.   IMPRESSION/PLAN:  Successful drug-eluting stent placement in the proximal  LAD.  The patient will be continued on Plavix for one year.  Aspirin will be  continued indefinitely.  She will be ambulated in two hours.  Attention will  be given to improved hypertension control.      WED/MEDQ  D:  06/30/2004  T:  06/30/2004  Job:  [id number]   cc:   [name], M.D.   Christena Deem. Melvyn Novas, M.D. [facility]

## 2010-08-04 NOTE — Op Note (Signed)
[name], [name]                          ACCOUNT NO.:  [id number]   MEDICAL RECORD NO.:  [id number]                   PATIENT TYPE:  AMB   LOCATION:  DAY                                  FACILITY:  [place]:  Devin M. Ciliberti, M.D.            DATE OF BIRTH:  April 19, 1945   DATE OF PROCEDURE:  11/12/2001  DATE OF DISCHARGE:                                 OPERATIVE REPORT   PREOPERATIVE DIAGNOSES:  1. Right ovarian mass.  2. Post menopausal.   POSTOPERATIVE DIAGNOSES:  1. Right ovarian mass.  2. Post menopausal.  3. Ovarian fibroma.   PROCEDURE:  Laparoscopic bilateral salpingo-oophorectomy and lysis of  adhesions.   SURGEONS:  1. Devin M. Ciliberti, M.D.  2. John T. Clarene Essex, M.D.   ANESTHESIA:  General.   ESTIMATED BLOOD LOSS:  50 cc   URINE OUTPUT:  100 and clear.   COMPLICATIONS:  None.   SPECIMENS:  Bilateral tubes and ovaries.   FINDINGS:  Right ovarian fibroma approximately 4 cm in diameter, left ovary  and tube appear normal. There is a bowel adhesion to the vaginal cuff;  otherwise, normal appearing pelvis.   DESCRIPTION OF PROCEDURE:  The patient was taken to the operating room and  placed in [place]. The patient was prepped and draped in a normal  sterile fashion. A Foley catheter was inserted into the bladder and clear  urine returned as noted. A sponge stick was then placed in the vagina for  manipulation of the vaginal cuff. A horizontal skin incision was made  infraumbilically and the underlying subcutaneous tissue was dissected down  to the fascia which was identified, grasped with Kocher clamps and elevated.  This was then incised with a scalpel and the edges of the fascia were then  anchored using #0 Vicryl. The entrance into the abdominal cavity was then  noted after entrance through the fascia. A 12 mm Hasson port was then placed  into the incision and anchored using the #0 Vicryl. Gas was then inflated  high flow into the  abdomen and a camera device was assembled and instilled  into the pelvis. Two 5 mm ports were then placed in lateral to the  epigastric vessels under direct visualization. The patient was then placed  in steep Trendelenburg position and the above findings were noted with a  right ovarian solid mass and bowel adhesed to the vaginal cuff. At this  point, a harmonic scalpel was used to free up the bowel adhesions to the  vaginal cuff without any difficulty. At this point, the infundibulopelvic  ligament on the right of the ovarian fibroma was then cauterized under low  intensity energy and transected. The round ligament was then cross clamped  and transected and the retroperitoneal space was dissected out and the  ureter was noted to be peristalsing and normal. At this point in between the  round and the infundibulopelvic  ligament was cross clamped and cauterized  using the harmonic scalpel which ultimately freed up the tube and ovary and  which was then placed in the cul-de-sac. Attention was then turned towards  the left ovary and there was one area of bowel adhesion to the left ovary  which was then freed by using the harmonic scalpel. In similar fashion, the  IP and the round ligament were both transected and cauterized using the  harmonic scalpel. The retroperitoneum was then dissected down isolating out  the ureter. At this point, the tissue superior to the ureter was then cross  clamped and cauterized to free up the tube and ovary on the left. This  pedicle was amputated, this was placed into the cul-de-sac. A 12 mm port was  then placed suprapubically and an Endopouch was then placed into the pelvis  and the two specimens were placed into the bag and removed through the 12 mm  port. During this process, the ovarian fibroma was taken apart to help get  through the fascia and then sent for frozen section. Once this was removed,  the fascia was then closed using a #0 Vicryl in a running  continuous stitch.  The patient was then flattened out and warm normal saline was used to  irrigate the pelvis. There was one area noted near the IP ligament on the  right which was then cauterized using the harmonic scalpel in a coag mode  and a piece of Intercede was also placed over the IP at that point.  Hemostasis was noted after this procedure. The gas was aired out and both 5  mm ports were removed under direct visualization, no bleeding noted. The 12  mm port in the umbilicus was also removed. The fascia in the umbilicus was  then closed using the already anchored #0 Vicryl sutures and the skin was  closed using a 4-0 Vicryl in subcuticular fashion. The patient was taken to  the recovery room in stable condition.                                               Devin M. Ciliberti, M.D.    DMC/MEDQ  D:  11/12/2001  T:  11/13/2001  Job:  [id number]

## 2010-08-04 NOTE — Op Note (Signed)
NAME:  [name], [name]                           ACCOUNT NO.:  [id number]   MEDICAL RECORD NO.:  [id number]                   PATIENT TYPE:  INP   LOCATION:  4650                                 FACILITY:  [facility]   PHYSICIAN:  Bluford Main., M.D.         DATE OF BIRTH:  1945/11/17   DATE OF PROCEDURE:  12/09/2001  DATE OF DISCHARGE:                                 OPERATIVE REPORT   PREOPERATIVE DIAGNOSES:  Adhesive small bowel obstruction.   POSTOPERATIVE DIAGNOSES:  Adhesive small bowel obstruction.   OPERATION:  Ileocolectomy with anastomosis.   SURGEON:  Ward Givens, M.D.   ANESTHESIA:  General.   DESCRIPTION OF PROCEDURE:  After the patient was monitored and well  anesthetized and had a Foley catheter and routine preparation and draping of  the abdomen, I made a lower midline incision beginning just below the  umbilicus and going down to the pubis. I carefully entered the abdomen  bluntly and found that the small bowel was massively distended and that  there was a good deal of serous very slightly cloudy abdominal fluid. No bad  odor was present. I opened the peritoneum through the length of the wound  taking care to avoid injury to the bladder and took down a few adhesions to  the anterior abdominal wall. In most of the lower abdomen, the small bowel  was free of adhesions. However, I found that a good deal of the small bowel  was very densely adherent in the pelvis. It did not really easily dissect  up. Because the distended bowel was so much in the way, I milked the fluid  and air which was in it back up into the stomach and that was removed  through the nasogastric tube. The colon was collapsed as was the distal part  of the ileum. I sharply and with cautery took down the pelvic adhesions  where the small bowel was stuck to the bladder to the area of recent pelvic  surgery into the sigmoid colon. It was also stuck somewhat in the  retroperitoneum. The  appendix was in the retroperitoneal position behind the  cecum and did not appear to be inflamed. I completely mobilized the affected  part of the bowel and found it to be thickened and a little bit injured in a  place or two where the adhesions were the densest. I decided that it was  best to resect that affected segment of bowel as it was not more than about  a foot long. Stapled across the proximal part of the bowel with the cutting  stapler, it divided nicely and vascularity appeared good. I then segmentally  divided the mesentery between Kelly clamps and ligated the vessels with 2-0  silks and reinforced those in places with 3-0 silk suture ligatures. I then  dissected up the cecum a bit further and stapled across the cecum distal to  the terminal  ileum and appendiceal orifice. Vascularity appeared good. I  then copiously irrigated the pelvis and got good hemostasis. I lay the  proximal bowel adjacent to the cecum and held it in place along the  antimesenteric border of each with two sutures of 3-0 silk and then removed  the small segments of the two staple lines and performed a side-to-side  anastomosis with the cutting stapler. I sucked away a small amount of sulcus  which spilled out. I then checked and saw that hemostasis was good. I closed  the defect which remained with interrupted 3-0 silks and reinforced the  corners with 3-0 silk stitches. I felt that the anastomosis was secure. I  then copiously irrigated again and removed irrigant. Hemostasis  was good. Sponge, needle and instrument counts were correct. The proximal  bowel was nicely decompressed and looked much better after relief of the  obstruction. I closed the fascia with running #1 PDS and then irrigated the  subcutaneous tissues and closed the skin with staples. The patient tolerated  the procedure well.                                               Bluford Main., M.D.    Clydene Pugh  D:  12/09/2001  T:   12/10/2001  Job:  [id number]   cc:   Lowella Bandy. Olevia Perches, M.D. [facility]   Devin M. Ciliberti, M.D.  [address] [place]  [place] [address]  Fax: [phone]

## 2010-08-04 NOTE — Consult Note (Signed)
NAME:  [name], [name]                           ACCOUNT NO.:  [id number]   MEDICAL RECORD NO.:  [id number]                   PATIENT TYPE:  INP   LOCATION:  5727                                 FACILITY:  [facility]   PHYSICIAN:  Sol Blazing, M.D.             DATE OF BIRTH:  November 02, 1945   DATE OF CONSULTATION:  03/07/2002  DATE OF DISCHARGE:                                   CONSULTATION   REASON FOR CONSULTATION:  Hypocalcemia.   PRIMARY DOCTOR:  Dr. Docia Chuck. Henrene Pastor.   HISTORY OF PRESENT ILLNESS:  The patient is a 65 year old African American  female with chronic diarrhea and questionable history of Crohn's disease  with positive p-ANCA, but negative biopsies.  She had a partial small bowel  resection due to adhesions and small-bowel obstruction in September of this  year.  She was admitted two days on the 18th with refractory nausea,  vomiting and chronic diarrhea, volume depletion and acute renal failure.  Creatinine was up to 2.9, potassium 2.4 and the calcium dropped from 7.2 to  6.3 and I was asked to see the patient today for hypocalcemia and  hypokalemia.  She does have a previous history of acute renal failure  associated with volume depletion.   She has apparently had a complicated GI history with chronic diarrhea and  questionable Crohn's ileitis, although she has had negative biopsies per  colonoscopy.  She has a strongly positive p-ANCA in the past.  She has been  tried on medications in the past including Entocort and Pentasa and others  without improvement.  She does have chronic hypokalemia, coronary artery  disease with PTCA x2 in the past, possible polyarthritis and elevated sed  rates with negative rheumatologic workup in the November 2002, major  depression, treated by Dr. Felizardo Hoffmann, GERD and an old CVA by MRI which  was done for intractable nausea and vomiting.  Also, she has had difficult-  to-control hypertension with a negative MRA and pheochromocytoma  workup by  Dr. Maudie Flakes. Fox in April of this year.   PAST SURGICAL HISTORY:  Partial hysterectomy, cholecystectomy, ventral  hernia repair and partial small bowel resection; one foot of ileum was  removed by Dr. Georgina Quint in September of this year with primary  reanastomosis.   MEDICATIONS ON ADMISSION:  1. Toprol.  2. Cozaar.  3. K-Dur 40 mEq q.i.d.  4. Multivitamins.  5. Reglan.  6. Clonidine.  7. Remeron.  8. Lomotil or Imodium p.r.n.   SOCIAL HISTORY:  The patient had returned back to work.  She is divorced and  has five children.  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  She has never smoked.  No alcohol or drug use.   REVIEW OF SYSTEMS:  Noncontributory.   PHYSICAL EXAMINATION:  GENERAL:  Pleasant black female in no distress.  VITAL SIGNS:  Blood pressure is 140/70, pulse 80, temperature 98.6.  NECK:  No JVD.  Neck is supple without meningismus.  HEENT:  Throat is moist.  CHEST:  Chest clear throughout.  CARDIAC:  Regular rate and rhythm without murmur, rub or gallop.  ABDOMEN:  Abdomen soft and nontender with well-healed abdominal scars.  EXTREMITIES:  No peripheral edema.   LABORATORY DATA:  Sodium 135, potassium 2.4, CO2 14, chloride 109, BUN 25,  creatinine 1.9, down from 2.9 yesterday, magnesium 0.6, calcium 6.3, albumin  3.2 yesterday.  Hemoglobin 8.8, platelets 290,000, white blood count 15,000,  down from 23,000 yesterday.  Urinalysis:  Protein 100, no red cells, 3 to 6  white blood cells per high power field.   IMPRESSION:  1. Hypocalcemia due to hypomagnesemia.  2. Severe hypomagnesemia due to chronic diarrhea, most likely.  3. Chronic diarrhea of uncertain etiology, possible Crohn's ileitis,     possible functional bowel disorder, consider laxative abuse.  4. Acute renal failure probably due to volume depletion and doubt intrarenal     disease.  Urinalysis is mostly normal.  5. Metabolic acidosis, probably due to recent gastrointestinal losses of      bicarbonate.  6. Chronic anemia.  7. Leukocytosis which is improving.   RECOMMENDATIONS:  1. Replace magnesium parenterally; this must be done prior to replacing     calcium.  She may need chronic magnesium supplements as well and this may     help with her hypokalemia also.  2. Replace potassium as you are doing.  3. Urine electrolytes.                                               Sol Blazing, M.D.    RDS/MEDQ  D:  03/07/2002  T:  03/09/2002  Job:  [id number]   cc:   Docia Chuck. Geri Seminole., M.D. [facility]  [address] [place] [address]  Fax: [phone]

## 2010-08-04 NOTE — Discharge Summary (Signed)
[name], [name] NO.:  [id number]   MEDICAL RECORD NO.:  [id number]          PATIENT TYPE:  INP   LOCATION:  7322                         FACILITY:  [facility]   PHYSICIAN:  Ethelle Lyon, M.D. LHCDATE OF BIRTH:  Aug 17, 1945   DATE OF ADMISSION:  06/28/2004  DATE OF DISCHARGE:  07/01/2004                                 DISCHARGE SUMMARY   DISCHARGE DIAGNOSES:  1.  Positive subendocardial myocardial infarction, status post cardiac      catheterization with placement of drug-eluting stent to the proximal      left anterior descending with systemic hypertension.  2.  Uncontrolled hypertension. The patient on Catapres, Tenormin,      Aldactone, and Diovan.  3.  Hypercholesterolemia.  Statin added this admission.  4.  Elevated erythromycin sedimentation rate at 40.  5.  Chronic bowel problems (diarrhea).  6.  History of hypokalemia treated with p.o. supplements.  7.  Anemia.  Hematocrit 36 down to 28.   PAST MEDICAL HISTORY:  1.  Hysterectomy.  2.  Hypertension.  3.  Multiple small bowel problems from colitis.  4.  Vague history of elevated sed rate and hypokalemia.   DISPOSITION:  The patient is being discharged home.   DISCHARGE MEDICATIONS:  She is instructed to continue her previous  medications including:  1.  Diovan which has been increased to 320 mg daily.  2.  Atenolol 25 mg p.o. b.i.d.  3.  Aldactone 25 mg daily.  4.  Clonidine 0.1 mg p.o. t.i.d. with the addition of Zocor 40 mg p.o.      daily.  5.  The patient has been instructed to continue her potassium as taken prior      to this admission.  6.  Aspirin 325 mg daily.  7.  Plavix 75 mg daily for one year.   FOLLOW UP:  The patient will follow up with Dr. Johnsie Cancel in two weeks.  Schedule her for a CBC to be drawn at the end of this week. She will follow  up her primary care physician for other health issues.   ACTIVITY:  No driving for two days.  No lifting over 10 pounds for one week.   DIET:  She is to follow a low-fat, low-salt diet.   DISCHARGE INSTRUCTIONS:  Gently clean catheter site with soap and water.  No  tub bathing for one week.   HOSPITAL COURSE:  This is a very pleasant 65 year old female followed by Dr.  Melvyn Novas, seen by Dr. Johnsie Cancel this admission.  The patient states she has a  history of cardiac catheterization and angioplasty about 10 years ago.  However, there are no records of this in E-chart and Dr. Johnsie Cancel was unable  to find a chart on her in the catheterization lab.  The patient presented to  [facility]  with complaints of increased substernal chest pain with  exertion over the last week prior to admission that was relieved in the  emergency room with nitroglycerin.  Dr. Johnsie Cancel felt the patient would be a  candidate for cardiac catheterization.  The  patient was admitted to  [facility].  Cardiac enzymes were cycled.  The patient ruled in for myocardial  infarction with a troponin of 0.18.  An EKG had anterior T wave inversions.  The patient was continued on aspirin.  Heparin was added along with  Integrilin and statin.  The patient was continued on aspirin. Heparin was  added along with Integrilin and statin.  The patient's potassium also low  during this admission, supplemented with p.o. and IV potassium at times.  To  the cath lab on April 14.  Showed single vessel obstructive coronary artery  disease of the left anterior descending, the proximal vessel, left  ventricular ejection fraction greater than 55% with apical hypokinesis and  1+ mitral regurgitation.  Placement of drug-eluting stent in the proximal  LAD.  The patient tolerated the procedure without complications.  Dr. Albertine Patricia  in to see the patient on the day of discharge.  Noticed downward trend in  hemoglobin, initially 11.1 and 9.7 today with hematocrit from 36 to 28.  Hemoglobin A1C 6.8, blood pressure 130/58.  The patient was in sinus rhythm  without complaints of chest pain or  shortness of breath and discharged home  with followup as stated above.      MB/MEDQ  D:  07/01/2004  T:  07/01/2004  Job:  [id number]

## 2010-08-04 NOTE — Discharge Summary (Signed)
NAME:  [name], [name]                           ACCOUNT NO.:  [id number]   MEDICAL RECORD NO.:  [id number]                   PATIENT TYPE:  INP   LOCATION:  5003                                 FACILITY:  [facility]   PHYSICIAN:  Docia Chuck. Henrene Pastor, M.D. [facility]             DATE OF BIRTH:  [birth year]   DATE OF ADMISSION:  01/18/2002  DATE OF DISCHARGE:  01/29/2002                                 DISCHARGE SUMMARY   ADMITTING DIAGNOSES:  1. Intractable nausea and vomiting of unclear etiology, rule out     gastroparesis, rule out bowel obstruction, rule out secondary to     gastroesophageal reflux disease, rule out medication induced, rule out     neurologic/endocrine etiologies.  2. Hypokalemia.  3. Diabetes mellitus, type 2, diet controlled.  4. Recent history of acute renal insufficiency.  5. Recent history of Klebsiella urinary tract infection during recent     October 2003 admission.  6. Hypertension, difficult to control in the recent past.  The patient has     undergone testing for vanillylmandelic acid, urine catecholamines, urine     metanephrines, with no diagnosis for pheochromocytoma supported after     multiple tests.  7. Recent acute renal insufficiency during recent hospitalization.  8. Gastroesophageal reflux disease, with upper endoscopy, January 06, 2002,     by Dr. Norberto Sorenson T. Fuller Plan.  9. Coronary artery disease, status post angioplasties in 1996, 1997 and     August of 2000.  10.      History of morbid obesity, patient currently overweight but not     morbidly obese.  11.      Status post back surgery on two occasions.  12.      Chronic rhinitis.  13.      Anemia.  14.      Status post abdominal hysterectomy.  15.      Status post ventral hernia repair.  16.      Status post laparoscopic bilateral salpingo-oophorectomy with lysis     of adhesions involving the ileum and sigmoid in August of 2003.  17.      History of small-bowel obstruction, status post ileocolectomy and   anastomosis with lysis of adhesions, December 09, 2001, by Dr. Georgina Quint.  18.      Questionable history of Crohn's ileitis.  Tissue specimens from     both colonic biopsies as well as surgical pathology from her September     surgery do not confirm presence of Crohn's.  The patient has had strongly     positive pASCA testing with history of diarrhea, raising question of     Crohn's disease in the past.  66.      Arthralgias with questionable remote history of lupus, a diagnosis     she was advised of by Dr. Viona Gilmore. Buddy Duty several years ago, does not seem to  have had any other sequelae of autoimmune disorders.  20.      Chronically elevated sedimentation rate.  21.      Status post cholecystectomy.   DISCHARGE DIAGNOSES:  1. Nausea and vomiting of undetermined etiology following extensive workup,     now resolved, almost immediately following initiation of therapy for     depression with Remeron.  2. Chronic diarrhea, stable at two to three loose bowel movements per day on     b.i.d. Lomotil.  3. Hypertension with less than ideal control; will need outpatient followup     for this.  4. Hypokalemia, resolved.  5. Anemia.  Mean corpuscular volume is within normal limits.  6. Diabetes mellitus, type 2, with dietary measures initiated, no oral     agents or insulin to be used as outpatient.  7. Chronically elevated sedimentation rate of uncertain etiology.  8. Major depression.  9. Status post surgery for small-bowel obstruction, September 23rd; no     evidence for small-bowel obstruction during this admission.  10.      Remote cystic lacunar infarct of the left frontal region confirmed     by both CT and MRI during this admission.  Brain imaging also showing     mild diffuse cerebral and cerebellar atrophy and old left basal gangliar     infarct.   BRIEF HISTORY:  The patient is a nice 65 year old African American woman.  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  She  has been labeled with Crohn's disease in the past, though as you can see  from the above listing of medical problems, the Crohn's disease diagnosis  was based on the fact that she had had a long history of diarrhea and  positive pASCA testing, however, biopsies at the time of colonoscopy as well  as at the time of recent surgery have not confirmed Crohn's disease, neither  have small-bowel series.  Colonoscopies have shown no mucosal changes.  The  patient had been hospitalized, January 04, 2002 through January 10, 2002,  with nausea, vomiting and diarrhea.  There was some question of small-bowel  obstruction, seeing how she has had multiple abdominopelvic surgeries and  the last of these being performed because of a small-bowel obstruction.  During that admission, she had undergone upper endoscopy on the 21st of  October by Dr. Fuller Plan that showed some mild reflux esophagitis, hiatal hernia  and a benign-appearing gastric polyp.  Duodenal biopsy did not confirm any  active inflammation nor villous atrophy and there was no presence of H.  pylori on either duodenal or gastric biopsy.  Small bowel enteroclysis on  October 22nd showed that she was status post resection of a significant  amount of ileum and status post cecal resection with anastomosis patent and  no evidence for any Crohn's disease.  Jejunal folds were prominent,  suggesting a mural small bowel process such as vasculitis, bowel hemorrhage  or perhaps changes secondary to prior surgeries.   During that hospitalization, she had also had hard-to-control hypertension  and had undergone adjustment of her medications as well as workup for acute  renal insufficiency by the renal physicians.  The patient's diarrhea  improved with Lomotil and Questran.  The nausea and vomiting briefly  subsided, at which point she was sent home and GI medication that she used once she left was only Imodium; other medications were  all antihypertensives  and potassium.  The nausea and vomiting  recurred within a few days of  finishing up a dose of ciprofloxacin used to treat a Klebsiella UTI which  had been diagnosed at the hospital.  She got the point where she was not  able to keep anything more than a little bit of liquids down and even then,  she sometimes was throwing up medications.  She re-presented to the hospital  and Dr. Sandy Salaam. Deatra Ina evaluated her and admitted her for further workup  and supportive care including IV fluids.   Review of previous GI charts from Dr. Loralee Pacas. Patterson's office showed she  had had a small-bowel follow-through in December of 2002 showing some  question of slight delay in gastric emptying but no evidence for Crohn's.  The pASCA testing was strongly positive, suggestive of Crohn's.  She had  been tried on Entocort for at least three or four months and Pentasa for a  few months, neither of which made much of a difference to her diarrhea;  Questran also did not work very well, according to the patient, but at the  time of this admission, the diarrhea was not so much of a problem as the  nausea and vomiting were.   CONSULTATIONS:  1. Dr. Felizardo Hoffmann from psychiatry.  2. Dr. Merri Ray. Thompson from general surgery.  3. Dr. Hilliard [name] A. Ellison from internal medicine.   PROCEDURES:  None.   LABORATORY AND ACCESSORY CLINICAL DATA:  Initial white blood cell count was  13, it was 11.9 on the 3rd of November, MCV normal at 81.1, hemoglobin  ranging 8.5 to 9.7, hematocrit ranged 25.5 to 28.6, platelets 286,000.  Sed  rate elevated at 124,000.  PT 15.5, INR 1.2 and PTT of 27.  Potassium as low  at 2.8, it was 4.1 near discharge, serum glucose ranging 141 to 153, BUN 3,  creatinine 1.1, calcium 8.2, albumin 2.9.  Initial AST was 41, final assay  24; ALT 59, final reading 33;  alkaline phosphatase initially 131, corrected  to 96; total bilirubin was 0.8.  TSH 1.030.  Random cortisol  level was 27;  following corticotropin stimulation, the cortisol level was 58.4; both of  these cortisol levels are within normal limits.  Urinalysis was negative, as  was the urine culture.   Radiologic imaging:  Initial acute abdominal series with chest films showed  no evidence of obstruction, perforation.  There were some opacifications in  the right upper quadrant and right lower quadrant of unclear etiology.  Chest films were negative for any acute or chronic changes.   CT scan of the abdomen and pelvis of January 21, 2002 showed some  inflammation/edema along with extraluminal gas in the vicinity of the  ileocolonic anastomotic staples, rule out anastomotic breakdown, no abscess  visualized.   Small-bowel follow-through on January 24, 2002 was somewhat limited due to  the patient's inability to consume any significant amount of barium,  however, there was no inflammatory process and no extravasation of barium. There was some mild separation of loops of small bowel in the right upper  quadrant which may possibly represent creeping fat related to inflammatory  bowel disease.   CT scan of the head, January 23, 2002, showed mild diffuse cerebral and  cerebellar atrophy, a probable 7-mm old left frontal lacunar infarct and  although less likely, need to rule out small cystic neoplasm.  Also seen  were an old left basal gangliar infarct.   MRI of the brain of January 24, 2002 showed mild atrophy  and remote cystic  lacunar infarct in the left frontal white matter.   HOSPITAL COURSE:  1. NAUSEA AND VOMITING:  The patient had extensive testing to determine the     etiology of her nausea and vomiting, which persisted throughout most of     the hospital stay.  GI etiologies were ruled out, endocrine etiology was     ruled out.  Finally, psychiatry was brought in, who did confirm that the     patient was probably having an episode of major depression and whether or     not this was  responsible for her nausea was not clear, however, she     received a dose of Remeron in the afternoon of January 26, 2002.  The     following morning, she had not had any nausea or vomiting within the last     8 to 10 hours and throughout the next couple of days of hospitalization,     nausea and vomiting did not recur; this represents certainly the longest     period of time with true nausea-free symptoms while tolerating solid     foods.  GI workup with a CT scan did raise a question of whether or not     she had a leak in the anastomosis and Dr. Grandville Silos from surgery evaluated     her and ordered the small-bowel follow-through, which was negative; that     small-bowel follow-through did again raise question of possible     inflammatory bowel disease/Crohn's because of its reading of the     creeping fat in the right lower quadrant.   1. DIARRHEA:  This had been pretty well controlled at home on what we     believe was Imodium therapy, although this may have been Lomotil.  In any     event, b.i.d. Lomotil seemed to control her diarrhea quite well and she     stayed on this during hospitalization and is to continue this as an     outpatient.   1. HYPERTENSION:  Initially, her blood pressure was well-controlled.     However, as her GI problems began to resolve, her blood pressure became     elevated, this even after she was restarted on her oral blood pressure     medication.  Dr. Loanne Drilling saw her and increased the dose of both her     Toprol-XL as well as her Cozaar.  At the time of her discharge, blood     pressures were still reading in the 159Y and diastolics into the 58P.     Further tweaking of her blood pressure medications may be necessary in     the future, but for now she is to go home on the current doses of Cozaar     and Toprol and has followup appointment to see Dr. Legrand Como B. Melvyn Novas, the     doctor she identifies as her primary care doctor, in the office on     Friday,  November 28th.  1. OLD CEREBROVASCULAR ACCIDENT:  The patient underwent CT scan testing as     part of the evaluation for etiology of the nausea and vomiting.  We were     trying to rule out mass lesion that might be causing the nausea and     vomiting.  This raised a question of cystic neoplasm and therefore, MRI     was performed.  The end result of this head imaging is that she  has     evidence for old CVAs but no evidence for mass lesions or neoplasia that     might have contributed to her nausea and vomiting.   1. MAJOR DEPRESSION:  Dr. Rhona Raider graciously evaluated the patient for     depression.  She had had no suicide attempts and had no features     suggestive of mania.  His Axis I diagnosis was that of major depressive     disorder, single episode of moderate intensity.  He did not feel she had     any current evidence of somatoform disorder.  He started her on a low     dose of Remeron at 7.5 mg p.o. every day, with the suggestion that if she     tolerated it at this dose and was felt to need more of an antidepressant     effect, that the dose could be increased up to 30 mg daily.     Possibilities for outpatient care for her depression included followup     with [facility], private psychotherapy, but is     possible that medication therapy may be all that she requires at this     point.  Of note, the patient's daughter-in-law joined the session with     Dr. Rhona Raider and thus family was informed of the psychiatric diagnosis.   1. HYPOKALEMIA:  The patient's dose of potassium coming into the hospital     was not clear.  Potassium was corrected using initially IV and then oral     therapy and potassium levels stabilized using 20 mEq daily of K-Dur.   1. DIABETES MELLITUS, TYPE 2:  The patient has never been initiated on any     oral agents.  She does say that she has had diabetic education sessions     before, but it is not clear how up to date she is on  diabetes therapy.     This may be something that needs to be followed up as an outpatient.  She     has not had testing of her hemoglobin A1c, either during this admission     or during her recent October admission, and this may need to be done when     she follows up with Dr. Melvyn Novas in the office.   CONDITION AT DISCHARGE:  Stable and much improved.   MEDICATIONS AT DISCHARGE:  1. Toprol-XL 150 mg p.o. every day.  Note that her previous dose was 50 mg     and to this was added a 100 mg tablet which she is to take along with her     50 mg tablet for a total dose of 150 mg daily as Toprol per the     apothecaries program comes in a 100 mg size as well as a 200 mg size, but     not a 150 mg size.  2. K-Dur 20 mEq p.o. every day.  3. Lomotil one tablet p.o. b.i.d.  4. Remeron 7.5 mg p.o. every day; again, this dose can be bumped up to 30 mg     a day if needed and if lower doses are tolerated.  5. Cozaar 50 mg one p.o. b.i.d.  6. Reglan 10 mg one p.o. t.i.d., 30 minutes a.c.  This medication may be    able to be tapered off in the future, but for now, we will leave her at     this time.   FOLLOWUP:  Followups are with Dr. [name], Tuesday, November 25th, and  with Dr. Melvyn Novas on November 28th.   DIET:  Diet is diabetic.   ACTIVITY:  The patient may return to work with the group home on January 30, 2002, which, by the way, made her very happy to be able to return to  work.      Gerre Couch, P.A. [facility]                    John N. Henrene Pastor, M.D. [facility]    SG/MEDQ  D:  01/29/2002  T:  01/30/2002  Job:  [id number]   cc:   Felizardo Hoffmann, M.D.  [place] [address]. [place], [place] [address]  Fax: [place] Grandville Silos, M.D.  [facility]  [address] [place], [place] [address]  Fax: [place]. [name], M.D. [facility]   Sean A. Loanne Drilling, M.D. [facility]   Christena Deem. Melvyn Novas, M.D. [facility]

## 2010-08-04 NOTE — Discharge Summary (Signed)
   NAME:  [name], BRAU                           ACCOUNT NO.:  [id number]   MEDICAL RECORD NO.:  [id number]                   PATIENT TYPE:  INP   LOCATION:  5003                                 FACILITY:  [facility]   PHYSICIAN:  Docia Chuck. Henrene Pastor, M.D. [facility]             DATE OF BIRTH:  17-Oct-1945   DATE OF ADMISSION:  01/18/2002  DATE OF DISCHARGE:  01/29/2002                                 DISCHARGE SUMMARY   ADDENDUM:  Additional medications added by the [facility] Internal Medicine Service at the  time of discharge were Clonidine 0.1 mg p.o. q.d., this in addition to the  Cozaar and Toprol for blood pressure control.     Gerre Couch, P.A. [facility]                    John N. Henrene Pastor, M.D. [facility]    SG/MEDQ  D:  01/29/2002  T:  01/30/2002  Job:  [id number]   cc:   Felizardo Hoffmann, M.D.  [place]. [place], [place] [address]  Fax: [place] Grandville Silos, M.D.  [facility]  [address] [place], [place] [address]  Fax: [place]. [name], M.D. [facility]   Christena Deem. Melvyn Novas, M.D. [facility]

## 2010-08-04 NOTE — Consult Note (Signed)
NAME:  [name], [name]                           ACCOUNT NO.:  [id number]   MEDICAL RECORD NO.:  [id number]                   PATIENT TYPE:  INP   LOCATION:  5727                                 FACILITY:  [facility]   PHYSICIAN:  Gatha Mayer, M.D. [facility]           DATE OF BIRTH:  Feb 09, 1946   DATE OF CONSULTATION:  03/06/2002  DATE OF DISCHARGE:                                   CONSULTATION   REQUESTING PHYSICIAN:  Danton Sewer, M.D.   REASON FOR CONSULTATION:  Nausea and vomiting, and diarrhea.   HISTORY OF PRESENT ILLNESS:  The patient is a 65 year old African-American  female well known to [place] from previous problems.  Details of her previous  admissions are well outlined, but in summary, she is a lady who had a  diagnosis of Crohn's disease made by serologic evaluation when she presented  with diarrhea to Dr. Verl Blalock.  Colonoscopy failed to show any  evidence of Crohn's disease.  She was treated with ASA compounds.  She  presented with signs consistent with a small bowel obstruction, thought  perhaps due to Crohn's disease in the fall of this year, and underwent  resection of the small bowel and right hemicolectomy by Dr. Lindwood Qua.  This is probably related to adhesions, and not Crohn's disease.  The  pathology showed no evidence for Crohn's disease.  Prior to that, she had an  ovarian cyst surgery because of adhesions as well.  Since her surgery, she  had repeated admissions with nausea and vomiting and some diarrhea.  It was  unclear what the cause of her symptoms were.  She had an  esophagogastroduodenoscopy that was unrevealing, small-bowel follow-through,  and an enteroclysis that were unrevealing.  CT scan of the abdomen and  pelvis demonstrated some possible extraluminal air in the right lower  quadrant thought probably to be postoperative change.  She did not have an  abscess, fever, or pain.  Small bowel series after that did not show a leak  or any obvious  irregularity for sure.  There was noted to be some separation  of the small bowel loops.  She improved, particularly after seeing  psychiatry with a diagnosis of major depression made, and was on Remeron,  and really significantly improved on that, and was discharged.  Was doing  well for about a month.  She was still having some sleeping difficulty, but  she was eating, cooking, and back to work for the first time in months.  She  had lost a lot of weight throughout all of this, but that seemed to have  leveled off except for maybe a couple of pounds she lost recently.  Of note,  CT scan of the head and MRI were negative for any type of tumor causing  nausea and vomiting.  She denies any fevers or pain at this time, but she  noted some loose watery stools  about five times a day, has had some  nocturnal along with some nausea and vomiting approximately five to six days  before admission.  This persisted despite her taking the Reglan, though she  was able to keep some things down.  Then she felt weaker and had muscle  aches.  She did not have myalgias or any type of viral syndrome prior to the  admission.  She was seen in Dr. Janifer Adie office with a potassium of 2.3,  white blood cell count 23,000, BUN 31, creatinine 2.9, and a sedimentation  rate of 129.  She was admitted to the hospital.  She feels much better since  she has had IV fluids, she has not vomited since last night.  She denies any  particular pain or fevers.  She does have a chronically elevated  sedimentation rate.   MEDICATIONS:  1. Toprol XL.  2. Potassium chloride.  3. Lomotil p.r.n.  4. Remeron.  5. Cozaar.  6. Reglan 10 mg t.i.d.  7. Clonidine 0.1 mg q.d.    PAST MEDICAL HISTORY:  1. As above.  2. Hypertension, which has been difficult to control.  Workup for     pheochromocytoma has been negative.  3. Type 2 diabetes mellitus.  4. History of renal insufficiency.  5. Prior Klebsiella urinary tract infection.   6. Obesity which has changed with the weight loss.  7. Back surgery x2.  8. Rhinitis.  9. Status post hysterectomy.  10.      Status post ventral hernia repair.  11.      Status post laparoscopic bilateral salpingo-oophorectomy with lysis     of adhesions.  12.      History of arthralgias.  At one point in her life she was told she     might have lupus, though there is no clear-cut evidence for that.  13.      Major depression.   FAMILY HISTORY:  Reviewed, and unchanged from the previous dictation and  records from earlier in the year.   SOCIAL HISTORY:  Reviewed, and unchanged from the previous dictation and  records from earlier in the year.   REVIEW OF SYMPTOMS:  Denies contact with anybody who has been ill.  None of  the children she cares for have been ill.  No cough or shortness of breath.  No headaches.  No focal weakness.  Depression is improved.  All other  systems appear negative at this time.   PHYSICAL EXAMINATION:  GENERAL:  Alert, in no acute distress, alert and  oriented x3.  VITAL SIGNS:  Pulse 78, blood pressure 161/95, respirations 20, temperature  97.8.  HEENT:  Eyes are anicteric.  Mouth:  Mucous membranes relatively moist.  At  this time, she is missing many teeth.  She had a coating on her tongue.  NECK:  Supple, no mass, no jugular venous distention.  CHEST:  Clear.  HEART:  S1 and S2. no murmurs, rubs, or gallops.  ABDOMEN:  Soft, nontender, no organomegaly, no mass.  RECTAL:  Deferred.  EXTREMITIES:  No cyanosis, clubbing, or edema.  NEUROLOGIC:  She is alert and oriented x3.  PSYCHIATRIC:  Affect is brighter then it had been when I had previously seen  her.  SKIN:  No rash.  LYMPH NODES:  No neck or groin adenopathy.   LABORATORY DATA:  Sodium 140, potassium 2.1, chloride 109, carbon dioxide  18, glucose 110, BUN 33, creatinine 2.9, calcium 7, total protein 7.6, albumin 3.2, AST 29, ALT  21, alkaline phosphatase 94, bilirubin normal at  0.8.   Sedimentation rate 114.  White blood cell count 15.5, hemoglobin 8.8,  hematocrit 25, MCV 79, RDW 15.7, platelet count 290.  Differential without  any significant abnormalities.  She has a left shift though.  Abdominal  films show changes consistent with ileus, nonspecific bowel gas pattern  really.  Urinalysis has shown bacteria.   ASSESSMENT:  1. Nausea and vomiting, and diarrhea.  The question is whether this is some     sort of an acute GI illness versus a recurrence of her previous problems.     At this point I am in favor of the former given her rapid response.  In     the past, she really had not bounced back quickly.  I do have some     question of whether or not there could be a postoperative process, though     it is not clear what that would be.  The CT scan on the last admission     suggested extraluminal air in the region of the surgery.  Small-bowel     follow-through was negative, there was no abscess, and surgery did not     feel that there was any particular problem there.  Really without pain or     fever that seems unlikely.  I really palpated deeply on her abdominal     examination and it caused her no distress.  2. Hypokalemia.  3. Renal insufficiency secondary to dehydration, presumably i.e., pre-renal.  4. The patient has a chronically elevated sedimentation rate for unclear     reasons.  5. She does have an anemia which is somewhat chronic at this time, mildly     microcytic.  Could be chronic disease.   PLAN:  1. Supportive care of this point with replete her potassium.  2. If she does not improve quickly or look like full recovery, consider a     colonoscopy again to look at the small bowel for possible Chron's disease     which I suppose is still possible despite the negative findings, though I     think unlikely.  In addition, could consider repeat CT scan once her     creatinine recovers to look at this previous abnormal area.  3. Increase her Remeron to  15 mg dose, depression is improved, but I think     this could be titrated up for more improvement.  4. One could consider an autoimmune workup, though she does not seem to have     any other obvious manifestations of that, I think her kidney problem is     related to dehydration.  She has previously been worked up for     aldosteronism and that has been negative.   I appreciate the opportunity to care for this patient.                                                Gatha Mayer, M.D. [facility]    CEG/MEDQ  D:  03/06/2002  T:  03/08/2002  Job:  [id number]   cc:   Danton Sewer, M.D. [facility]   Loralee Pacas. [name], M.D. [facility]  [address] [address]  Fax: 1   Felizardo Hoffmann, M.D.  [address] [place],  [place] [address]  Fax: [phone]

## 2010-08-04 NOTE — Discharge Summary (Signed)
NAMEUMEKA, WRENCH NO.:  [id number]   MEDICAL RECORD NO.:  [id number]                    PATIENT TYPE:   LOCATION:                                       FACILITY:   PHYSICIAN:  Larrie Kass., MD              DATE OF BIRTH:   DATE OF ADMISSION:  DATE OF DISCHARGE:                                 DISCHARGE SUMMARY   HISTORY OF PRESENT ILLNESS:  The patient is a 65 year old female who was  admitted to [facility] because of abdominal pain and  vomiting. She is about three weeks status post bilateral oophorectomy for an  adnexal mass, which was benign. See the history and physical for greater  details. Her general health is good. She does have coronary artery disease  and has had an angioplasty and she has hypertension. There is a possible  history of Crohn's disease.   HOSPITAL COURSE:  The patient was admitted by Dr. Olevia Perches and given IV  fluids, pain medicines, and carefully observed. I saw her on December 09, 2001 at which time it was fairly obvious by x-ray and other criteria that  she had a small intestinal obstruction. She was advised to undergo  laparotomy because she had significant pain and tenderness. She underwent  exploration with finding of very dense pelvic adhesions with acute small  bowel obstruction. It was necessary to do a resection of a small part of the  ileum and cecum. A staple anastomosis was done. The patient was slow to  recover, taking quite a bit of time for her ileus to resolve. She developed  no sign of wound infection. There was some leukocytosis and diarrhea when  her GI tract began functioning but she had a negative C-difficeal toxin on  two occasions. She was given Flagyl nevertheless because of the suspicion of  that diagnosis. She felt much better on December 20, 2001. The wound was well  healed and staples removed. She was discharged and asked to see me in the  office in two to three weeks.  There was no evidence of any inflammatory  bowel disease and histology of a specimen.   DIAGNOSIS:  1. Small bowel obstruction due to adhesions.  2. Hypertension.  3. Coronary artery disease, stable.  4. Status post recent pelvic surgery.   OPERATION:  Lysis of adhesion and small bowel and cecal resection with  anastomosis.   CONDITION ON DISCHARGE:  Improved.                                               Larrie Kass., MD    WB/MEDQ  D:  01/04/2002  T:  01/04/2002  Job:  [id number]   cc:   Delfin Edis, MD [facility]

## 2010-08-04 NOTE — Assessment & Plan Note (Signed)
[name]                              CARDIOLOGY OFFICE NOTE   NAME:[name], [name]                   MRN:          [id number]  DATE:11/26/2005                            DOB:          03-Mar-1946    [name] returns to [place] today for followup.  She has had __________ interventions,  LAD and diagonal.  She had a low risk Myoview in February.  There was  trivial apical ischemia which most likely was differential breast  attenuation.   She denied any new chest pain.  She continues to be over weight and  sedentary.  She saw Dr Melvyn Novas recently and apparently is a diabetic.  He has  been holding off on oral hypoglycemics but I suspect if [name] does not take  better care of herself she will need to start these.  I explained to her  that hyperglycemia will make it more likely for her to have restenosis.   She has been on aspirin and Plavix.  She needs a refill on her  nitroglycerin.   EXAMINATION:  GENERAL:  She is overweight.  VITAL SIGNS:  The blood pressure 130/70.  Pulse is 80 and regular.  LUNGS:  Clear.  HEART:  Carotids normal.  There is __________ heart sounds.  ABDOMEN:  __________ .  EXTREMITIES:  Pulses __________ .   IMPRESSION:  1. Coronary disease.  Risk factors fairly well modified except for      diabetes, to be followed by Dr. Melvyn Novas.  Continue current medications.      She is on an angiotension receptor blocker in regards to her      proteinuria.  2. She had low risk Myoview earlier this year.  3. She was given refill for her nitroglycerin and I will see her back in a      year.                               Wallis Bamberg. Johnsie Cancel, MD, [facility]    PCN/MedQ  DD:  11/26/2005  DT:  11/26/2005  Job #:  [id number]

## 2010-08-04 NOTE — Discharge Summary (Signed)
NAME:  [name], [name]                           ACCOUNT NO.:  [id number]   MEDICAL RECORD NO.:  [id number]                   PATIENT TYPE:  INP   LOCATION:  5003                                 FACILITY:  [facility]   PHYSICIAN:  Larrie Kass., M.D.            DATE OF BIRTH:  [birth year]   DATE OF ADMISSION:  01/18/2002  DATE OF DISCHARGE:  01/29/2002                                 DISCHARGE SUMMARY   HISTORY:  Patient is a 65 year old, black female who recently had  ileocolectomy associated with small bowel obstruction and Crohn's disease.  She had persistent vomiting and diarrhea postoperatively and presented to  the Emergency Department with severe dehydration.  She was admitted by my  partner, Dr. Autumn Messing for care.  She, at that time, was about three weeks  status post surgery.  See previous records for details.   HOSPITAL COURSE:  The patient was given IV fluids, pain medicine, medicine  for nausea.  She was found to have creatinine of 4.7, BUN 25.  This was felt  to be due to dehydration, possibly with an element of chronic renal failure.  It was felt that she might have recrudescence of her Crohn's disease.  I  consulted Gastroenterology and continued supportive care.  Dr. Fuller Plan saw her  and felt that it was a complex situation, did not feel that any invasive  procedures were needed.  The patient gradually improved.  She was seen also  by the Nephrology service and they managed fluids.  Her renal function  seemed to recover and improve.  Patient improved slowly.  Enterocleisis was  done.  It did not show obstruction.  Patient had an Enterococcus urinary  tract infection which was treated.  On 01/10/02, she was comfortable and  diarrhea had resolved.  She is eating and bowels were moving.  She was felt  to be ready for discharge and gastroenterology concurred.  She was sent home  with arrangements made for follow up with all specialties.  Creatinine was  1.1 at time of  discharge and potassium was normal.   DIAGNOSES:  1. Severe dehydration due to ileus or small bowel obstruction.  2. Possible Crohn's disease.  3. Acute renal failure, improved.    OPERATIONS:  None.   DISCHARGE CONDITION:  Improved.                                                Larrie Kass., M.D.    WB/MEDQ  D:  03/31/2002  T:  03/31/2002  Job:  [id number]   cc:   Maudie Flakes. Hassell Done, M.D.  [address]  [place]  [place] [address]  Fax: [phone]   Pricilla Riffle. Dagoberto Ligas., M.D. [facility]  [address]  [address]  Fax: 1

## 2010-08-04 NOTE — H&P (Signed)
NAME:  [name], [name]                           ACCOUNT NO.:  [id number]   MEDICAL RECORD NO.:  [id number]                   PATIENT TYPE:  INP   LOCATION:  5737                                 FACILITY:  [facility]   PHYSICIAN:  Sammuel Hines. Daiva Nakayama, M.D.              DATE OF BIRTH:  October 13, 1945   DATE OF ADMISSION:  01/04/2002  DATE OF DISCHARGE:                                HISTORY & PHYSICAL   HISTORY OF PRESENT ILLNESS:  The patient is a 64 year old African-American  female who has a history of Crohn's ileitis who was recently admitted back  in late September for a small bowel obstruction.  She was taken to the  operating room by Dr. Deon Pilling and underwent an ileocolectomy with  anastomosis.  Postoperatively, she states that she went home on 12/20/01, and  had a couple of days where she felt better, and since that time has had  persistent nausea, vomiting, and diarrhea.  She has been unable to hold  anything down.  She has had very little urine output lately.  She denies any  fevers or chills, chest pains or shortness of breath.  The rest of her  review of systems are unremarkable.   PAST MEDICAL HISTORY:  1. Crohn's ileitis.  2. Coronary artery disease.   PAST SURGICAL HISTORY:  1. Laparoscopic salpingo-oophorectomy.  2. Exploratory laparotomy with ileocolectomy and lysis of adhesions.   MEDICATIONS:  1. Phenergan.  2. Percocet.  3. Toprol XL 50 mg q.d.  4. K-Dur.  5. Cozaar 50 mg q.d.  6. Methyldopa 500 mg q.d.   ALLERGIES:  No known drug allergies.   SOCIAL HISTORY:  She denies any alcohol or tobacco use.   FAMILY HISTORY:  Significant for breast cancer in her mother.   PHYSICAL EXAMINATION:  GENERAL:  She is a well-developed, well-nourished  black female in no acute distress.  SKIN:  Warm and dry.  No jaundice.  HEENT:  Extraocular movements were intact.  Pupils equal, round, reactive to  light.  LUNGS:  Clear bilaterally.  HEART:  Regular rate and rhythm.  ABDOMEN:   Soft, nontender, incision is well-healed.  She has good bowel  sounds.  EXTREMITIES:  No cyanosis, clubbing, or edema.  NEUROLOGIC:  Alert and oriented x4.  HEMATOLOGIC:  No lymphadenopathy.   ASSESSMENT AND PLAN:  This is a 65 year old African-American female who is  now a little more then three weeks status post resection of her terminal  ileum and right colon for Crohn's ileitis who now has severe dehydration.  We will admit her for IV fluid resuscitation and to work up her continued  diarrhea and nausea.  Sammuel Hines. Daiva Nakayama, M.D.    PST/MEDQ  D:  01/04/2002  T:  01/05/2002  Job:  [id number]

## 2010-08-04 NOTE — H&P (Signed)
NAME:  [name], [name]                           ACCOUNT NO.:  [id number]   MEDICAL RECORD NO.:  [id number]                   PATIENT TYPE:  INP   LOCATION:  5727                                 FACILITY:  [facility]   PHYSICIAN:  Gatha Mayer, M.D. [facility]           DATE OF BIRTH:  [birth year]   DATE OF ADMISSION:  03/05/2002  DATE OF DISCHARGE:  03/10/2002                                HISTORY & PHYSICAL   ADMISSION DIAGNOSES:  1. Nausea, vomiting, and diarrhea, recurrent.  2. Electrolyte disturbances including hyperkalemia, hypocalcemia, and     hypomagnesemia, all thought related to the diarrhea.  3. Chronic diarrhea.  Diagnosis of Crohn's ileitis has been entertained;     however, biopsies of the small bowel were negative for any inflammatory     bowel disease.  Other etiologies include functional bowel disorder or     possible postoperative problem.  4. Status post laparoscopic bilateral salpingo-oophorectomy with lysis of     adhesions.  5. Status post ventral hernia repair.  6. History of small-bowel obstruction status post ileus resection with right     hemicolectomy by Georgina Quint, M.D. in the fall of 2003.  7. Status post hysterectomy.  8. Status post back surgery x 2.  9. Major depression disorder.  10.      Obesity, resolved due to weight loss over the past 8 to 12 months.  11.      History of Klebsiella urinary tract infection.  12.      Acute renal insufficiency with history of same on secondary volume     depletion.  13.      Type 2 diabetes mellitus.  14.      Hypertension, difficult to control.  Negative chromocytoma workup     per Dr. Hassell Done in April of this year.  23.      Old cerebrovascular accident event found on MRI in fall 2002.  No     head CT findings to explain intractable nausea or vomiting.  16.      Coronary artery disease status post percutaneous transluminal     coronary angioplasty x 2.  17.      Chronically elevated sedimentation rates with  possible     polyarthritis and negative rheumatologic workup in November 2002.   DISCHARGE DIAGNOSES:  1. Nausea, vomiting and diarrhea, rapidly resolved.  Symptoms may have been     secondary to acute gastroenteritis.  2. Electrolyte abnormalities, resolved with supplementation, hydration, and     resolution of the diarrhea.  3. Chronic and recurrent diarrhea with nausea and vomiting of undetermined     etiology.  Diagnosis of Crohn's disease has been entertained based on     markedly positive ANCA tests, but there has been no tissue diagnosis to     confirm this despite surgical biopsies of the small bowel and colon.  4. Status post multiple abdominal and  pelvic surgeries as above.  5. The patient has leukocytosis with a slight left shift, resolving at the     time of discharge.  6. Anemia with normal mean corpuscle volume, hemoglobin 7.4 at discharge.  7. Chronically elevated sedimentation rate.  8. Acute renal insufficiency, resolved with hydration.   BRIEF HISTORY:  The patient is a pleasant 65 year old white female well  known to [place] service because of multiple admissions for nausea,  vomiting, and diarrhea.  At this time, she was admitted initially from Dr.  Gustavus Bryant office for recurrent symptoms associated wit marked electrolyte  abnormalities. Symptoms have been present this time after about a month or  so of feeling really pretty well.  In fact, she has been able to go back to  work.  Baseline had been loose stools about twice a day with no nausea or  vomiting and good appetite.  However, in the five days prior to this  admission, she had recurrent loose and watery stools along with nausea and  vomiting.  Prior stool studies have been negative for any infectious  etiology including C. difficile.  Prior studies to work up the problem  includes upper endoscopy, colonoscopy, small-bowel follow-through, CT scan  of abdomen, pelvis, and head, as well as small-bowel  enteroclysis.   The patient was admitted initially to Dr. Gustavus Bryant service, but GI was  consulted and then took over the patient's care as they were more familiar  with her plight.   LABORATORY DATA:  Initial hemoglobin 15.5, corrected and measured 11.2 at  discharge.  Hemoglobin went from 11.8 down to 7.4  Hematocrit 5.3 to 21.4.  MCV 78.7.  platelets 254,000.  Left shift showed 81% neutrophils and 12,500  absolute neutrophils.  Otherwise differential within normal limits.  Erythrocyte sedimentation rate was 114.  Potassium low at 2.1 at admission;  it was 3.3 at discharge.  Sodium was 140.  Chloride 109, CO2  14, glucose  ranging anywhere from 94 to 114.  BUN initially 25 corrected to 8.  Creatinine 1.9 corrected to 0.8.  Calcium initially 6.3, corrected to 7.8 at  discharge.  Albumin low at 2.53.  Total bilirubin 0.8, alkaline phosphatase  94, AST 29, ALT 21.  Magnesium 0.6 to 1.4.  Phosphorus 2.1 to 3.  Serum  protein electrophoresis showed total protein 6.5, serum albumin 49.4.  Alpha-  1 globulin 3.4 which is high.  Alph-2 globulin 14.5 which is high.  Beta  globulin 12.6 which is normal.  Gamma globulin 20.1 which is high.  The  interpretation of this pattern was nonspecific and sometimes seen and  associated with chronic inflammatory response pattern.  Iron 60, B12 41, and  folate 2.3.  Ferritin 704.  Phenolphalein in the store was not detected.  Urine chemistries for 24-hour urine showed increase in protein of 301 mg,  reference range 50 to 100.  Sodium urine potassium, chloride, creatinine  were all normal in the 24-hour urine.  Blood cultures were negative.  Stool  for leukocytes, C. difficile, Salmonella, Shigella, Campylobacter normal.  Stool cultures normal.  Stool O & P exam did not end up being completed.  Stool Giardia and Cryptosporidium screening was negative.  Urine for Bence  Jones protein  showed albumin detected in urine, alpha 1 globulin, alpha 2 globulin, beta  globulin, and gamma globulin in urine all detected.   Acute abdominal series and chest films showed no active cardiopulmonary  disease and a mild ileus pattern consistent with films from 01/05/2002.  HOSPITAL COURSE:  The patient was admitted to the hospital and begun on  volume resuscitation with IV fluids.  Dr. Roney Jaffe was called in to  evaluate renal insufficiency as well as electrolyte abnormalities.  He  ordered multiple tests including serum protein electrophoresis and urine  enzymes and proteins.  She was given appropriate supplementation and  hydrated and received oral potassium as well as oral magnesium oxide.  Eventually her electrolytes though did not entirely normalize were in the  process of normalizing when she requested discharge home.   Diarrhea, nausea and vomiting actually resolved within 24 hours which is  unusual given her prior history of being in hospital for several days while  symptoms lingered.  Therefore, Dr. Carlean Purl felt that this incidence of acute  GI symptoms might have been secondary to a gastroenteritis.  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  Therefore, she is  probably exposed to a lot of potential GI pathogens.   The patient's diet was slowly advanced from clear liquids to a low residue  diabetic diet which she tolerated.  Her renal consult, Dr. Mercy Moore,  thought it was okay for her to go home with plans to continue oral  supplementation of potassium and magnesium as an outpatient.  She had  appointments set up with Dr. Melvyn Novas on January 2 and with Dr. Carlean Purl on  January 5.   CONDITION ON DISCHARGE:  Much improved and stable.   DISCHARGE MEDICATIONS:  1. Toprol XL 100 mg p.o. q.a.m. and 50 mg p.o. q.p.m.  2. Catapres patch 0.1 mg applied once weekly.  3. Cozaar 50 mg 1 p.o. b.i.d.  4. Magnesium oxide 400 mg p.o. b.i.d.  5. Os-Cal 500 mg p.o. t.i.d.  6. K-Dur 20 mEq p.o. t.i.d.  Of note, she has 20 mEq  tablets at home, so she     is to take 4 of these three times daily.  7. Lomotil 2 tablets three to four times daily for diarrhea with advice that     she can wean this down to whatever dose was controlling diarrhea to about     one to two a day.   DIET:  Low-residue, diabetic.     Gerre Couch, P.A. [facility]                    Gatha Mayer, M.D. [facility]    SG/MEDQ  D:  06/15/2002  T:  06/15/2002  Job:  [id number]   cc:   Sol Blazing, M.D.  [address]  [place]  [place] [address]  Fax: [phone]   Christena Deem. Melvyn Novas, M.D. [facility]

## 2010-08-04 NOTE — Consult Note (Signed)
[facility]  Patient:    [name], [name] Visit Number: [id number] MRN: [id number]          Service Type: GON Location: GYN Attending Physician:  Alvino Chapel Dictated by:   Cherly Anderson. Clarke-Pearson, M.D. Admit Date:  05/13/2001 Discharge Date: 05/13/2001   CC:         Clydene Fake, M.D.  Caswell Corwin, R.N.   Consultation Report  REASON FOR CONSULTATION:  This is a 65 year old, black female who presents for consultation requested by Dr. Sabino Donovan regarding newly-diagnosed adnexal mass. The patient was undergoing a CT scan of the abdomen and pelvis on April 16, 2001, for evaluation of possible Crohns ileocolitis and an elevated sedimentation rate when a 5-cm pelvic mass was detected. She subsequently had an ultrasound in Dr. Ulla Potash office which is reported as showing a 4 x 3.6 x 3.7 cm mass which apparently has a solid component and a mixed echogenic pattern. There is also a moderate amount of fluid in the posterior cul-de-sac. CA125 is measured at 5.2, CEA of 1.4.  The patient has a past history of undergoing abdominal hysterectomy approximately 16 years ago for menorrhagia. She has been on Premarin over the past several years.  PAST MEDICAL HISTORY/MEDICAL ILLNESSES:  Hypertension; coronary artery disease. The patient has had 2 balloon angioplasties and a stent placed. The last intervention was approximately 3-4 years ago. It is noted that the patient gets short of breath after walking up 1 flight of stairs. She denies any angina or chest pain.  PAST SURGICAL HISTORY:  Total abdominal hysterectomy, cholecystectomy, hiatal hernia repair, and lumbar laminectomy.  DRUG ALLERGIES:  DARVOCET causes GI upset.  FAMILY HISTORY:  Reveals that the patients mother had breast cancer.  SOCIAL HISTORY:  The patient works with autistic teenagers. She does not smoke. She comes accompanied by her daughter today.  REVIEW OF SYSTEMS:   Otherwise negative.  PHYSICAL EXAMINATION:  VITAL SIGNS:  Weight 217 pounds, height 5 feet 4 inches. Blood pressure 175/98, pulse 80, respiratory rate 16.  GENERAL:  The patient is a moderately obese, black female in no acute distress.  HEENT:  Negative.  NECK:  Supple without thyromegaly. There is no supraclavicular or inguinal adenopathy.  ABDOMEN:  Soft, nontender. No masses, hepatosplenomegaly, ascites, or hernias are noted. She has a well-healed Pfannenstiel incision, well-healed right costal incision, and a well-healed upper midline incision in the epigastrium.  PELVIC:  EG/BUS normal. Vagina is clean. No lesions are noted.  BIMANUAL AND RECTOVAGINAL:  Reveal no clear cut masses or nodularity.  EXTREMITIES:  Lower extremities are without edema or varicosities.  IMPRESSION:  A 5-cm complex pelvic mass with fluid in the cul-de-sac and a normal CA125. While it is unclear as to the nature of this mass, I believe it is most likely benign. Nonetheless, I think it is reasonable to offer the patient surgical removal, so that we can be certain as to the histologic diagnosis. She is in agreement with this plan. We will schedule surgery for Aug 04, 2001, and the surgery will be done by Dr. Clarene Essex with Dr. Sabino Donovan assisting.  The risks of surgery, including hemorrhage, infection, injury to adjacent viscera, and thromboembolic complications are outlined to the patient and her daughter. They are also aware of the extent of surgical staging should this turn out to be an ovarian cancer. She is also in agreement to have her other ovary removed at this time even if it appears to be normal.  All of their  questions were answered, and we will go ahead with preoperative screening. Dictated by:   Cherly Anderson. Clarke-Pearson, M.D. Attending Physician:  Alvino Chapel DD:  05/13/01 TD:  05/13/01 Job: [id number]

## 2010-08-08 ENCOUNTER — Ambulatory Visit (INDEPENDENT_AMBULATORY_CARE_PROVIDER_SITE_OTHER): Payer: Self-pay | Admitting: Family Medicine

## 2010-08-08 ENCOUNTER — Encounter: Payer: Self-pay | Admitting: Family Medicine

## 2010-08-08 ENCOUNTER — Ambulatory Visit (INDEPENDENT_AMBULATORY_CARE_PROVIDER_SITE_OTHER): Payer: Self-pay | Admitting: *Deleted

## 2010-08-08 ENCOUNTER — Other Ambulatory Visit: Payer: Self-pay | Admitting: *Deleted

## 2010-08-08 DIAGNOSIS — E538 Deficiency of other specified B group vitamins: Secondary | ICD-10-CM

## 2010-08-08 DIAGNOSIS — M25539 Pain in unspecified wrist: Secondary | ICD-10-CM

## 2010-08-08 DIAGNOSIS — M654 Radial styloid tenosynovitis [de Quervain]: Secondary | ICD-10-CM | POA: Insufficient documentation

## 2010-08-08 DIAGNOSIS — M25531 Pain in right wrist: Secondary | ICD-10-CM | POA: Insufficient documentation

## 2010-08-08 MED ORDER — GLIPIZIDE 10 MG PO TABS: 20.0000 mg | ORAL_TABLET | Freq: Two times a day (BID) | ORAL | Status: DC

## 2010-08-08 MED ORDER — HYDROXYZINE PAMOATE 50 MG PO CAPS: 50.0000 mg | ORAL_CAPSULE | Freq: Every evening | ORAL | Status: DC | PRN

## 2010-08-08 MED ORDER — ATORVASTATIN CALCIUM 20 MG PO TABS: 20.0000 mg | ORAL_TABLET | Freq: Every day | ORAL | Status: DC

## 2010-08-08 MED ORDER — CYANOCOBALAMIN 1000 MCG/ML IJ SOLN
1000.0000 ug | Freq: Once | INTRAMUSCULAR | Status: AC
  Administered 2010-08-08: 1000 ug via INTRAMUSCULAR

## 2010-08-08 NOTE — Assessment & Plan Note (Signed)
Right wrist - underwent corticosteroid injection in the office today as noted above. Fitted with thumb keeper splint. Remainder of plan as noted above.

## 2010-08-08 NOTE — Patient Instructions (Signed)
You have DeQuervain's tenosynovitis (tendonitis in your wrist). We injected your wrist with a steroid (cortisone) in office today - this may temporarily raise your blood sugars. Wear the thumb splint during the day & while at work for the next 3-4 weeks - you do not need to sleep in this. May take Aleve 1-2 tabs twice a day with food for the next 7-days, then as needed Follow-up 4-6 weeks if no improvement, otherwise as needed.

## 2010-08-08 NOTE — Progress Notes (Signed)
  Subjective:    Patient ID: [name], female    DOB: [birth year], 65 y.o.   MRN: [id number]  HPI 65 year old right-hand-dominant female referred to the office by Dr. Lynnae January for evaluation of right wrist, thumb pain. This has been ongoing for approximately 3 months, patient denies any injury or trauma. 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 Pain is mainly at the base of the thumb extending along radial aspect of the forearm. She has no associated numbness or tingling. She denies any weakness. He uses Aleve intermittently with little improvement. Denies any previous issues with her thumb or wrist. Has not tried any wrist splints. Patient does have history of diabetes.  Past Medical History  Diagnosis Date  . Hypertension     2006 B renal arteries patent. 2003 MRA - no RAS. 2003 pheo W/U Dr Hassell Done reportedly negative.  Marland Kitchen CAD (coronary artery disease) 1996    1996 - PTCA and angioplasty diagonal branch. 2000 - Rotoblator & angiopllasty of diagonal. 2006 - subendocardial AMI, DES to proximal LAD.Marland Kitchen Also had 90% stenosis in distal apical LAD. EF 55 with apical hypokinesis. Indefinite ASA and Plavix.  . Abdominal discomfort     Chronic N/V/D. Presumptive dx Crohn's dx per elevated p ANCA. Failed Entocort and Pentasa. Sep 2003 - ileocolectomy c anastomosis per Dr Deon Pilling 2/2 adhesions - path was hegative for Crohns. EGD, Sm bowel follow through (11/03), and an eteroclysis (10/03) were unrevealing. Cuases hypomag and hypocalcemia.  . Anemia     Multifactorial. Baseline HgB 10-11 ish. B12 def - 150 in 3/10. Fe Def - ferritin 35 3/10. Both are being repleted.  . Stroke     Incidental finding MRI 2002 L lacunar infarct  . Diabetes mellitus     Insulin dependent  . Hyperlipidemia   . Allergy     Seasonal  . Chronic pain     CT 10/10 = Spinal stenosis L2 - S1.  Marland Kitchen Chronic kidney disease     Chronic renal insuff baseline Cr 1.2 - 1.4  ish.  . Adnexal mass 8/03    s/p lap BSO (R ovarian fibroma) & lysis of adhesions   Allergies  Allergen Reactions  . Diazepam     REACTION: Agitation  . Haloperidol Lactate   . Lorazepam Nausea Only  . Morphine Sulfate     REACTION: Agitation  . Propoxyphene Hcl     REACTION: Vomiting  . Tramadol Hcl     REACTION: sick on stomach     Review of Systems Per history of present illness, otherwise negative    Objective:   Physical Exam GENERAL: Alert and oriented x3, in no acute distress, pleasant SKIN: No rashes or lesions RESPIRATORY: Respirations 15 and unlabored MSK: Evaluation of right hand and wrist reveals some mild soft tissue swelling dorsally at the MCP joint of the thumb. She has full range of motion of the wrist and thumb, does have increased pain with ulnar deviation of the wrist. Positive Finkelstein's test. She is tender to palpation along the first extensor compartment of the wrist. Minimal tenderness over MCP joint of the thumb. She has normal grip strength. She has negative Tinel's and Phalen's. Left hand and wrist with full range of motion without pain, swelling, tenderness, weakness. VASCULAR: Pulses +2/4 radial artery and symmetric, normal capillary refill NEURO: Sensation intact to light touch         Assessment & Plan:

## 2010-08-08 NOTE — Assessment & Plan Note (Signed)
Right wrist pain secondary to DeQuervain's tenosynovitis - Underwent corticosteroid injection the first extensor compartment in office today - advised patient may temporarily raise her blood sugars. PROCEDURE NOTE: Consent obtained and verified. Sterile betadine prep. Furthur cleansed with alcohol. Topical analgesic spray: Ethyl chloride. Joint: Right wrist first extensor compartment Approached in typical fashion Completed without difficulty Meds: 2 cc lidocaine 1% + 1cc Kenalog 40 mg/cc Needle: 25-gauge 1.5 inch Aftercare instructions and Red flags advised. Tolerated procedure well without any complications. - Fitted with thumb keeper brace to wear while at work and with activity over the next 3-4 weeks. - May benefit from use of Aleve one to 2 tabs twice a day with food over the next week, then as needed follow [place] in 4-6 weeks for reevaluation if no improvement, otherwise as needed. -

## 2010-08-08 NOTE — Telephone Encounter (Signed)
Rx faxed in.

## 2010-08-15 ENCOUNTER — Ambulatory Visit: Payer: Self-pay | Admitting: Internal Medicine

## 2010-08-29 ENCOUNTER — Other Ambulatory Visit: Payer: Self-pay | Admitting: Internal Medicine

## 2010-08-29 DIAGNOSIS — E119 Type 2 diabetes mellitus without complications: Secondary | ICD-10-CM

## 2010-09-07 ENCOUNTER — Other Ambulatory Visit: Payer: Self-pay | Admitting: *Deleted

## 2010-09-07 MED ORDER — INSULIN GLARGINE 100 UNIT/ML ~~LOC~~ SOLN: 30.0000 [IU] | Freq: Every day | SUBCUTANEOUS | Status: DC

## 2010-09-07 NOTE — Telephone Encounter (Signed)
Pls sch routine F/U in about mid August with me.

## 2010-09-07 NOTE — Telephone Encounter (Signed)
Lantus rx refill request faxed to [place].  Message sent to front desk for an appt.

## 2010-09-08 ENCOUNTER — Ambulatory Visit (INDEPENDENT_AMBULATORY_CARE_PROVIDER_SITE_OTHER): Payer: Self-pay | Admitting: *Deleted

## 2010-09-08 DIAGNOSIS — D649 Anemia, unspecified: Secondary | ICD-10-CM

## 2010-09-08 MED ORDER — CYANOCOBALAMIN 1000 MCG/ML IJ SOLN
1000.0000 ug | Freq: Once | INTRAMUSCULAR | Status: AC
  Administered 2010-09-08: 1000 ug via INTRAMUSCULAR

## 2010-09-12 ENCOUNTER — Other Ambulatory Visit: Payer: Self-pay | Admitting: *Deleted

## 2010-09-12 MED ORDER — CLOPIDOGREL BISULFATE 75 MG PO TABS: 75.0000 mg | ORAL_TABLET | Freq: Every day | ORAL | Status: DC

## 2010-09-12 NOTE — Telephone Encounter (Signed)
Plavix rx refilled - request form faxed to [place].

## 2010-10-09 ENCOUNTER — Ambulatory Visit (INDEPENDENT_AMBULATORY_CARE_PROVIDER_SITE_OTHER): Payer: Self-pay | Admitting: *Deleted

## 2010-10-09 DIAGNOSIS — D649 Anemia, unspecified: Secondary | ICD-10-CM

## 2010-10-09 MED ORDER — CYANOCOBALAMIN 1000 MCG/ML IJ SOLN
1000.0000 ug | Freq: Once | INTRAMUSCULAR | Status: AC
  Administered 2010-10-09: 1000 ug via INTRAMUSCULAR

## 2010-11-08 ENCOUNTER — Other Ambulatory Visit: Payer: Self-pay | Admitting: *Deleted

## 2010-11-08 MED ORDER — GLIPIZIDE 10 MG PO TABS: 20.0000 mg | ORAL_TABLET | Freq: Two times a day (BID) | ORAL | Status: DC

## 2010-11-08 NOTE — Telephone Encounter (Signed)
Faxed to the [facility].

## 2010-11-09 ENCOUNTER — Ambulatory Visit: Payer: Self-pay

## 2010-11-14 ENCOUNTER — Ambulatory Visit: Payer: Self-pay | Admitting: Internal Medicine

## 2010-11-29 ENCOUNTER — Ambulatory Visit (INDEPENDENT_AMBULATORY_CARE_PROVIDER_SITE_OTHER): Payer: Self-pay | Admitting: *Deleted

## 2010-11-29 ENCOUNTER — Other Ambulatory Visit: Payer: Self-pay | Admitting: Internal Medicine

## 2010-11-29 DIAGNOSIS — D649 Anemia, unspecified: Secondary | ICD-10-CM

## 2010-11-29 DIAGNOSIS — Z1231 Encounter for screening mammogram for malignant neoplasm of breast: Secondary | ICD-10-CM

## 2010-11-29 MED ORDER — CYANOCOBALAMIN 1000 MCG/ML IJ SOLN
1000.0000 ug | Freq: Once | INTRAMUSCULAR | Status: AC
  Administered 2010-11-29: 1000 ug via INTRAMUSCULAR

## 2010-12-08 ENCOUNTER — Ambulatory Visit (HOSPITAL_COMMUNITY): Payer: Self-pay

## 2010-12-13 ENCOUNTER — Ambulatory Visit: Payer: Self-pay | Admitting: Internal Medicine

## 2010-12-13 ENCOUNTER — Ambulatory Visit (INDEPENDENT_AMBULATORY_CARE_PROVIDER_SITE_OTHER): Payer: Self-pay | Admitting: Internal Medicine

## 2010-12-13 ENCOUNTER — Encounter: Payer: Self-pay | Admitting: Internal Medicine

## 2010-12-13 DIAGNOSIS — I2510 Atherosclerotic heart disease of native coronary artery without angina pectoris: Secondary | ICD-10-CM

## 2010-12-13 DIAGNOSIS — E119 Type 2 diabetes mellitus without complications: Secondary | ICD-10-CM

## 2010-12-13 DIAGNOSIS — R609 Edema, unspecified: Secondary | ICD-10-CM

## 2010-12-13 DIAGNOSIS — R600 Localized edema: Secondary | ICD-10-CM

## 2010-12-13 DIAGNOSIS — Z299 Encounter for prophylactic measures, unspecified: Secondary | ICD-10-CM

## 2010-12-13 DIAGNOSIS — I10 Essential (primary) hypertension: Secondary | ICD-10-CM

## 2010-12-13 DIAGNOSIS — Z23 Encounter for immunization: Secondary | ICD-10-CM

## 2010-12-13 MED ORDER — FUROSEMIDE 20 MG PO TABS: 20.0000 mg | ORAL_TABLET | Freq: Two times a day (BID) | ORAL | Status: DC

## 2010-12-13 MED ORDER — INSULIN ASPART 100 UNIT/ML ~~LOC~~ SOLN: SUBCUTANEOUS | Status: DC

## 2010-12-13 MED ORDER — POTASSIUM CHLORIDE ER 10 MEQ PO TBCR: 20.0000 meq | EXTENDED_RELEASE_TABLET | Freq: Every day | ORAL | Status: DC

## 2010-12-13 NOTE — Assessment & Plan Note (Signed)
Bilateral 1+ leg edema up to the knees started about 4-6 weeks before. No crackles on lung exam. No significant JVD. Patient denies any short of breath. Although her last EF was 40-50% per echo in 2006. She says that she does have history of CHF. She also has had leg swelling the past and was started on Lasix as described in history of present illness. She is going to see her cardiologist on 12/26/2010. I will start her on Lasix 20 mg by mouth twice a day and will give 20 meq potassium daily. Will check a BMP today and will call her to stop potassium if her potassium is higher than 5. She will be seen again in 7-10 days for evaluation of her leg swellings and repeat BMP for potassium levels.

## 2010-12-13 NOTE — Progress Notes (Signed)
  Subjective:    Patient ID: [name], female    DOB: 1946-02-28, 65 y.o.   MRN: [id number]  HPI [name] is a pleasant 65 year woman with past with history of well-controlled DM 2, CAD, hyperlipidemia who comes to the clinic with chief complaint of bilateral leg swelling for 4-6 weeks. She noticed swelling in her leg both legs about 4-6 weeks before. The swelling get worse in the evening after work. The swelling does not get better after sleep when she wakes up in morning. She denies any facial swelling or swelling anywhere in the body. She denies any pain in the legs. She said that she had similar swelling the legs long time before and was started on Lasix for that. She also had drop in potassium levels at that time and so was on potassium pills along with Lasix. She was seen by Dr. Lynnae January in may 2012 and was referred to sports medicine clinic for  tenosynovitis of right hand which is better now after sports medicine clinic visit. She denies any significant shortness of breath, chest pain, palpitations. She is a denies any fever, chills, nausea, vomiting, abdominal pain, diarrhea.    Review of Systems    as per history of present illness, all other systems reviewed negative. Objective:   Physical Exam  Constitutional: Vital signs reviewed.  Patient is a well-developed and well-nourished in no acute distress and cooperative with exam. Alert and oriented x3.  Head: Normocephalic and atraumatic Ear: TM normal bilaterally Mouth: no erythema or exudates, MMM Eyes: PERRL, EOMI, conjunctivae normal, No scleral icterus.  Neck: Supple, Trachea midline normal ROM Cardiovascular: RRR, S1 normal, S2 normal, no MRG Pulmonary/Chest: CTAB, no wheezes, rales, or rhonchi Abdominal: Soft. Non-tender, non-distended, bowel sounds are normal Musculoskeletal: No joint deformities, erythema, or stiffness, ROM full and no nontender  extremities : 1+ pedal edema bilaterally below the knees. No  thigh edema. No tenderness to palpation  Neurological: A&O x3, Strenght is normal and symmetric bilaterally, cranial nerve II-XII are grossly intact, no focal motor deficit Skin: Warm, dry and intact. No rash, cyanosis, or clubbing. .         Assessment & Plan:

## 2010-12-13 NOTE — Assessment & Plan Note (Signed)
Lab Results  Component Value Date   NA 138 05/04/2010   K 4.1 05/04/2010   CL 104 05/04/2010   CO2 26 05/04/2010   BUN 19 05/04/2010   CREATININE 1.03 05/04/2010   CREATININE 1.17 05/04/2010    BP Readings from Last 3 Encounters:  12/13/10 128/69  08/08/10 153/76  08/01/10 140/74    Assessment: Hypertension control:  controlled  Progress toward goals:  at goal Barriers to meeting goals:  no barriers identified  Plan: Hypertension treatment:  continue current medications

## 2010-12-13 NOTE — Assessment & Plan Note (Addendum)
Lab Results  Component Value Date   HGBA1C 7.4 12/13/2010   HGBA1C  Value: 6.0 (NOTE)                                                                       According to the ADA Clinical Practice Recommendations for 2011, when HbA1c is used as a screening test:   >=6.5%   Diagnostic of Diabetes Mellitus           (if abnormal result  is confirmed)  5.7-6.4%   Increased risk of developing Diabetes Mellitus  References:Diagnosis and Classification of Diabetes Mellitus,Diabetes PYPP,5093,26(ZTIWP 1):S62-S69 and Standards of Medical Care in         Diabetes - 2011,Diabetes [id number],25  (Suppl 1):S11-S61.* 05/05/2010   CREATININE 1.03 05/04/2010   CREATININE 1.17 05/04/2010   MICROALBUR 8.01* 04/11/2009   MICRALBCREAT 52.4* 04/11/2009   CHOL  Value: 161        ATP III CLASSIFICATION:  <200     mg/dL   Desirable  200-239  mg/dL   Borderline High  >=240    mg/dL   High        05/05/2010   HDL 40 05/05/2010   TRIG 243* 05/05/2010    Last eye exam and foot exam:    Component Value Date/Time   HMDIABEYEEXA No diabetic retinopathy. HTN changes 06/08/2009    Assessment: Diabetes control: not controlled Progress toward goals: deteriorated Barriers to meeting goals: no barriers identified  Plan: Diabetes treatment: As per Dr. Phylliss Bob plan in may 2012 -I will add prandial NovoLog 2 units before lunch.  Refer to: none Instruction/counseling given: reminded to bring blood glucose meter & log to each visit, reminded to bring medications to each visit and discussed diet

## 2010-12-13 NOTE — Patient Instructions (Addendum)
Please make a followup appointment in 7-10 days. Start using NovoLog 2 units before lunch everyday. Keep checking your sugars as you do and bring the meter next time so we can adjust the dose of insulin. Keep using Lantus at same dose-30 units before bedtime. Start the taking Lasix 20 mg by mouth twice a day and take 20 meq potassium daily-that is 2 pills of 10 mEq. I will give you a call if the lab tests are abnormal and we need to hold potassium. If anything comes up meanwhile, call the clinic or go to the ER.

## 2010-12-14 ENCOUNTER — Ambulatory Visit (HOSPITAL_COMMUNITY)
Admission: RE | Admit: 2010-12-14 | Discharge: 2010-12-14 | Disposition: A | Discharge status: Home / Self Care | Payer: Self-pay | Source: Ambulatory Visit | Attending: Internal Medicine | Admitting: Internal Medicine

## 2010-12-14 DIAGNOSIS — Z1231 Encounter for screening mammogram for malignant neoplasm of breast: Secondary | ICD-10-CM | POA: Insufficient documentation

## 2010-12-14 LAB — BASIC METABOLIC PANEL WITH GFR
BUN: 31 mg/dL — ABNORMAL HIGH (ref 6–23)
CO2: 19 mEq/L (ref 19–32)
Chloride: 105 mEq/L (ref 96–112)
Creat: 1.59 mg/dL — ABNORMAL HIGH (ref 0.50–1.10)
Potassium: 4.8 mEq/L (ref 3.5–5.3)

## 2010-12-21 LAB — GLUCOSE, CAPILLARY: Glucose-Capillary: 119 mg/dL — ABNORMAL HIGH (ref 70–99)

## 2010-12-26 ENCOUNTER — Ambulatory Visit (INDEPENDENT_AMBULATORY_CARE_PROVIDER_SITE_OTHER): Payer: Self-pay | Admitting: Cardiovascular Disease

## 2010-12-26 ENCOUNTER — Encounter: Payer: Self-pay | Admitting: Cardiovascular Disease

## 2010-12-26 VITALS — BP 133/72 | HR 77 | Ht 64.0 in | Wt 202.0 lb

## 2010-12-26 DIAGNOSIS — R609 Edema, unspecified: Secondary | ICD-10-CM

## 2010-12-26 DIAGNOSIS — R600 Localized edema: Secondary | ICD-10-CM

## 2010-12-26 DIAGNOSIS — I2510 Atherosclerotic heart disease of native coronary artery without angina pectoris: Secondary | ICD-10-CM

## 2010-12-26 DIAGNOSIS — N189 Chronic kidney disease, unspecified: Secondary | ICD-10-CM

## 2010-12-26 DIAGNOSIS — I10 Essential (primary) hypertension: Secondary | ICD-10-CM

## 2010-12-26 LAB — URINALYSIS
Bilirubin Urine: NEGATIVE
Leukocytes, UA: NEGATIVE
Nitrite: NEGATIVE
Specific Gravity, Urine: 1.025 (ref 1.000–1.030)
Total Protein, Urine: NEGATIVE
pH: 5.5 (ref 5.0–8.0)

## 2010-12-26 LAB — BASIC METABOLIC PANEL
Chloride: 108 mEq/L (ref 96–112)
GFR: 53.6 mL/min — ABNORMAL LOW (ref >60.00)
Potassium: 4.7 mEq/L (ref 3.5–5.1)
Sodium: 141 mEq/L (ref 135–145)

## 2010-12-26 NOTE — Assessment & Plan Note (Signed)
Check BMET and UA for protein to see if contributing to edema

## 2010-12-26 NOTE — Assessment & Plan Note (Signed)
Stable with no angina and good activity level.  Continue medical Rx  

## 2010-12-26 NOTE — Patient Instructions (Signed)
Your physician wants you to follow-up in: 6 months You will receive a reminder letter in the mail two months in advance. If you don't receive a letter, please call our office to schedule the follow-up appointment.   Your physician has requested that you have an echocardiogram. Echocardiography is a painless test that uses sound waves to create images of your heart. It provides your doctor with information about the size and shape of your heart and how well your heart's chambers and valves are working. This procedure takes approximately one hour. There are no restrictions for this procedure.   Your physician has requested that you have a lower extremity venous duplex. This test is an ultrasound of the veins in the legs or arms. It looks at venous blood flow that carries blood from the heart to the legs or arms. Allow one hour for a Lower Venous exam. Allow thirty minutes for an Upper Venous exam. There are no restrictions or special instructions.  Your physician recommends that you return for lab work in: today

## 2010-12-26 NOTE — Assessment & Plan Note (Signed)
Continue bid lasix.  Echo and LE venous duplex

## 2010-12-26 NOTE — Progress Notes (Signed)
[name] is seen today for F/U of CAD with pevious rotoblator to the LAD in 2000. Her last myovue on 11/2006 showed mild apical thinning and was low risk. She has not had any SSCP and has been compliant with her meds.Her BP is alwyas hard to Rx and she is on multiple drugs with persistant mild tachycardia. She denies dyspnea, PND orthopnea.  Has some swelling in her feet.  ROS: Denies fever, malais, weight loss, blurry vision, decreased visual acuity, cough, sputum, SOB, hemoptysis, pleuritic pain, palpitaitons, heartburn, abdominal pain, melena, , claudication, or rash.  All other systems reviewed and negative  General: Affect appropriate Healthy:  appears stated age 65: normal Neck supple with no adenopathy JVP normal no bruits no thyromegaly Lungs clear with no wheezing and good diaphragmatic motion Heart:  S1/S2 no murmur,rub, gallop or click PMI normal Abdomen: benighn, BS positve, no tenderness, no AAA no bruit.  No HSM or HJR Distal pulses intact with no bruits No edema Neuro non-focal Skin warm and dry No muscular weakness   Current Outpatient Prescriptions  Medication Sig Dispense Refill  . 1st Choice Lancets Thin MISC by Does not apply route 2 (two) times daily.        Marland Kitchen amLODipine (NORVASC) 10 MG tablet Take 10 mg by mouth daily.        Marland Kitchen aspirin 81 MG tablet Take 81 mg by mouth daily.        Marland Kitchen atenolol (TENORMIN) 50 MG tablet Take 50 mg by mouth 2 (two) times daily.        Marland Kitchen atorvastatin (LIPITOR) 20 MG tablet Take 1 tablet (20 mg total) by mouth at bedtime.  90 tablet  3  . clopidogrel (PLAVIX) 75 MG tablet Take 1 tablet (75 mg total) by mouth daily.  90 tablet  3  . colesevelam (WELCHOL) 625 MG tablet Take 3 tablets (1,875 mg total) by mouth daily.  540 tablet  1  . cyanocobalamin 1000 MCG/ML injection Inject 1,000 mcg into the muscle every 30 (thirty) days.       Marland Kitchen desloratadine (CLARINEX) 5 MG tablet Take 1 tablet (5 mg total) by mouth daily.  90 tablet  2  . ferrous  sulfate 325 (65 FE) MG tablet Take 325 mg by mouth 3 (three) times daily with meals.        . furosemide (LASIX) 20 MG tablet Take 1 tablet (20 mg total) by mouth 2 (two) times daily.  60 tablet  2  . glipiZIDE (GLUCOTROL) 10 MG tablet Take 2 tablets (20 mg total) by mouth 2 (two) times daily before a meal.  400 tablet  0  . glucose blood (TRUETRACK TEST) test strip 1 each by Other route 2 (two) times daily. Use as instructed       . hydrOXYzine (VISTARIL) 50 MG capsule Take 1 capsule (50 mg total) by mouth at bedtime as needed.  90 capsule  1  . insulin aspart (NOVOLOG) 100 UNIT/ML injection Inject 2 units before lunch every day.  10 mL  12  . insulin glargine (LANTUS) 100 UNIT/ML injection Inject 30 Units into the skin at bedtime.  30 mL  3  . Insulin Syringe-Needle U-100 (INSULIN SYRINGE 1CC/28G) 28G X 1/2" 1 ML MISC by Does not apply route.        . nitroGLYCERIN (NITROSTAT) 0.4 MG SL tablet Place 1 tablet (0.4 mg total) under the tongue every 5 (five) minutes as needed.  90 tablet  3  . potassium chloride (K-DUR)  10 MEQ tablet Take 2 tablets (20 mEq total) by mouth daily.  60 tablet  1  . quinapril (ACCUPRIL) 10 MG tablet Take 1 tablet (10 mg total) by mouth daily.  90 tablet  2    Allergies  Diazepam; Haloperidol lactate; Lorazepam; Morphine sulfate; Propoxyphene hcl; and Tramadol hcl  Electrocardiogram:  NSR 77 nonspecific lateral T wave changes    Assessment and Plan

## 2010-12-26 NOTE — Assessment & Plan Note (Signed)
Well controlled.  Continue current medications and low sodium Dash type diet.    

## 2010-12-27 ENCOUNTER — Telehealth: Payer: Self-pay | Admitting: Cardiovascular Disease

## 2010-12-27 NOTE — Telephone Encounter (Signed)
Spoke with pt, aware of lab results [name]

## 2010-12-27 NOTE — Telephone Encounter (Signed)
rtn call

## 2011-01-05 ENCOUNTER — Other Ambulatory Visit: Payer: Self-pay | Admitting: *Deleted

## 2011-01-05 MED ORDER — ATENOLOL 50 MG PO TABS: 50.0000 mg | ORAL_TABLET | Freq: Two times a day (BID) | ORAL | Status: DC

## 2011-01-08 NOTE — Telephone Encounter (Signed)
Tenormin rx called to [name].

## 2011-01-12 ENCOUNTER — Other Ambulatory Visit: Payer: Self-pay | Admitting: *Deleted

## 2011-01-15 ENCOUNTER — Telehealth: Payer: Self-pay | Admitting: Dietician

## 2011-01-15 ENCOUNTER — Encounter: Payer: Self-pay | Admitting: Dietician

## 2011-01-15 DIAGNOSIS — E119 Type 2 diabetes mellitus without complications: Secondary | ICD-10-CM

## 2011-01-15 MED ORDER — AMLODIPINE BESYLATE 10 MG PO TABS: 10.0000 mg | ORAL_TABLET | Freq: Every day | ORAL | Status: DC

## 2011-01-15 NOTE — Telephone Encounter (Signed)
Will ask front desk to sch an all with me in Dec / Jan

## 2011-01-15 NOTE — Telephone Encounter (Signed)
Thank you for following up.

## 2011-01-15 NOTE — Telephone Encounter (Signed)
Spoke with patient about new insulin type added at visit September 26th: she reports she has been taking 2 units of Novolog when she eats lunch which is about 4 times a week. Blood sugar is better: ~ 175 before dinner. she is satisfied with this and does not think the novolog needs to be increased at this time. Blood sugar Has not been over 200 and fasting is ~ 75 - 125 mg/dl. Patient had no concerns of questions, feels new insulin is helping and she is not having any problems with it. (no hypoglycemia or symptoms of hypoglycemia)  Follow up is scheduled for 02/20/2011

## 2011-01-15 NOTE — Telephone Encounter (Signed)
Norvasc rx refilled - request from faxed to [facility].

## 2011-01-15 NOTE — Telephone Encounter (Signed)
Asked by Dr. Lynnae January to call patient as she was started on prandial insulin at last visit. Lefty message for patient to return call.

## 2011-01-16 ENCOUNTER — Other Ambulatory Visit: Payer: Self-pay | Admitting: *Deleted

## 2011-01-16 MED ORDER — HYDROXYZINE PAMOATE 50 MG PO CAPS: 50.0000 mg | ORAL_CAPSULE | Freq: Every evening | ORAL | Status: DC | PRN

## 2011-01-16 MED ORDER — GLIPIZIDE 10 MG PO TABS: 20.0000 mg | ORAL_TABLET | Freq: Two times a day (BID) | ORAL | Status: DC

## 2011-01-16 NOTE — Telephone Encounter (Signed)
Refills faxed to [facility].  Sander Nephew, RN 01/16/2011 1:17 PM

## 2011-01-26 ENCOUNTER — Other Ambulatory Visit: Payer: Self-pay | Admitting: Cardiovascular Disease

## 2011-01-26 ENCOUNTER — Other Ambulatory Visit (HOSPITAL_COMMUNITY): Payer: Self-pay | Admitting: Radiology

## 2011-01-26 DIAGNOSIS — R609 Edema, unspecified: Secondary | ICD-10-CM

## 2011-01-29 ENCOUNTER — Encounter: Payer: Self-pay | Admitting: *Deleted

## 2011-01-29 ENCOUNTER — Ambulatory Visit (HOSPITAL_COMMUNITY): Payer: Self-pay | Attending: Cardiology | Admitting: Radiology

## 2011-01-29 ENCOUNTER — Encounter (INDEPENDENT_AMBULATORY_CARE_PROVIDER_SITE_OTHER): Payer: Self-pay | Admitting: *Deleted

## 2011-01-29 DIAGNOSIS — R0989 Other specified symptoms and signs involving the circulatory and respiratory systems: Secondary | ICD-10-CM | POA: Insufficient documentation

## 2011-01-29 DIAGNOSIS — E785 Hyperlipidemia, unspecified: Secondary | ICD-10-CM | POA: Insufficient documentation

## 2011-01-29 DIAGNOSIS — I059 Rheumatic mitral valve disease, unspecified: Secondary | ICD-10-CM | POA: Insufficient documentation

## 2011-01-29 DIAGNOSIS — R0609 Other forms of dyspnea: Secondary | ICD-10-CM | POA: Insufficient documentation

## 2011-01-29 DIAGNOSIS — I079 Rheumatic tricuspid valve disease, unspecified: Secondary | ICD-10-CM | POA: Insufficient documentation

## 2011-01-29 DIAGNOSIS — I10 Essential (primary) hypertension: Secondary | ICD-10-CM | POA: Insufficient documentation

## 2011-01-29 DIAGNOSIS — R609 Edema, unspecified: Secondary | ICD-10-CM

## 2011-01-29 DIAGNOSIS — E119 Type 2 diabetes mellitus without complications: Secondary | ICD-10-CM | POA: Insufficient documentation

## 2011-01-31 ENCOUNTER — Other Ambulatory Visit: Payer: Self-pay | Admitting: *Deleted

## 2011-01-31 MED ORDER — COLESEVELAM HCL 625 MG PO TABS: 1875.0000 mg | ORAL_TABLET | Freq: Every day | ORAL | Status: DC

## 2011-01-31 NOTE — Telephone Encounter (Signed)
Welchol rx refill request faxed to [facility].

## 2011-02-06 ENCOUNTER — Telehealth: Payer: Self-pay | Admitting: *Deleted

## 2011-02-06 NOTE — Telephone Encounter (Signed)
Call from pt to transfer prescription for Lasix to the [facility] on [address] due to cost.  Call to [facility] pt has 2 refills left on her prescription there.  Cancelled refills there.  Prescription called to the [facility] on [address] Furosemide 20 mg tablets 1 po twice daily with 1 refill per previous order of Dr. Ester Rink. Sander Nephew, RN 02/06/2011 9:30 AM

## 2011-02-06 NOTE — Telephone Encounter (Signed)
Agree  Thank you

## 2011-02-20 ENCOUNTER — Ambulatory Visit (INDEPENDENT_AMBULATORY_CARE_PROVIDER_SITE_OTHER): Payer: Self-pay | Admitting: Internal Medicine

## 2011-02-20 DIAGNOSIS — I2510 Atherosclerotic heart disease of native coronary artery without angina pectoris: Secondary | ICD-10-CM

## 2011-02-20 DIAGNOSIS — I10 Essential (primary) hypertension: Secondary | ICD-10-CM

## 2011-02-20 DIAGNOSIS — D649 Anemia, unspecified: Secondary | ICD-10-CM

## 2011-02-20 DIAGNOSIS — E119 Type 2 diabetes mellitus without complications: Secondary | ICD-10-CM

## 2011-02-20 LAB — GLUCOSE, CAPILLARY: Glucose-Capillary: 138 mg/dL — ABNORMAL HIGH (ref 70–99)

## 2011-02-20 MED ORDER — CYANOCOBALAMIN 1000 MCG/ML IJ SOLN: 1000.0000 ug | Freq: Once | INTRAMUSCULAR | Status: DC

## 2011-02-20 MED ORDER — QUINAPRIL HCL 20 MG PO TABS: 10.0000 mg | ORAL_TABLET | Freq: Every day | ORAL | Status: DC

## 2011-02-20 MED ORDER — POTASSIUM CHLORIDE ER 10 MEQ PO TBCR: 20.0000 meq | EXTENDED_RELEASE_TABLET | Freq: Every day | ORAL | Status: DC

## 2011-02-20 MED ORDER — CYANOCOBALAMIN 1000 MCG/ML IJ SOLN
1000.0000 ug | Freq: Once | INTRAMUSCULAR | Status: AC
  Administered 2011-02-20: 1000 ug via INTRAMUSCULAR

## 2011-02-20 NOTE — Patient Instructions (Addendum)
1. You are doing EXCELLENT!! Keep up the good work. 2. Start taking two quinapril 10 mg. When you get the new prescription, they will be a higher dose (11m) and you will only need one a day.  Repeat BP was normal so pt verbally told to leave ACEI at old dosage of 10 mg 3. See me in 3 months - March 2013

## 2011-02-23 MED ORDER — QUINAPRIL HCL 10 MG PO TABS: 10.0000 mg | ORAL_TABLET | Freq: Every day | ORAL | Status: DC

## 2011-02-23 NOTE — Assessment & Plan Note (Signed)
She still sees Dr Johnsie Cancel. On ASA, statin, BB, & plavix. Asymptomatic except mild and occ CP if takes stairs.

## 2011-02-23 NOTE — Assessment & Plan Note (Addendum)
Needs B12 today

## 2011-02-23 NOTE — Assessment & Plan Note (Signed)
Initial BP elevated and we decided to increase ACEI. BP recheck and was nl. Will leave ACEI at current dose.

## 2011-02-23 NOTE — Assessment & Plan Note (Signed)
No log today. Last A1c 7.4. Reviewed med regimen. No symptomatc lows. UTD on all screening. Lab Results  Component Value Date   HGBA1C 7.4 12/13/2010

## 2011-02-23 NOTE — Progress Notes (Signed)
  Subjective:    Patient ID: [name], female    DOB: 29-Aug-1945, 65 y.o.   MRN: [id number]  HPI  Please see the A&P for the status of the pt's chronic medical problems.    Review of Systems  Constitutional:       No hypoglycemic sxs  Respiratory: Negative for chest tightness and shortness of breath.   Cardiovascular: Positive for leg swelling. Negative for chest pain.  Musculoskeletal:       Leg pain  MCP pain gone       Objective:   Physical Exam  Constitutional: She appears well-developed and well-nourished.  HENT:  Head: Normocephalic and atraumatic.  Right Ear: External ear normal.  Left Ear: External ear normal.  Nose: Nose normal.  Eyes: Conjunctivae are normal.  Musculoskeletal: She exhibits edema. She exhibits no tenderness.  Neurological: She is alert.  Skin: Skin is warm and dry. No rash noted. No erythema.  Psychiatric: She has a normal mood and affect. Her behavior is normal. Judgment and thought content normal.          Assessment & Plan:

## 2011-04-20 ENCOUNTER — Other Ambulatory Visit: Payer: Self-pay | Admitting: *Deleted

## 2011-04-20 MED ORDER — HYDROXYZINE PAMOATE 50 MG PO CAPS: 50.0000 mg | ORAL_CAPSULE | Freq: Every evening | ORAL | Status: DC | PRN

## 2011-04-20 NOTE — Telephone Encounter (Signed)
Rx called in 

## 2011-04-26 ENCOUNTER — Other Ambulatory Visit: Payer: Self-pay | Admitting: *Deleted

## 2011-04-26 DIAGNOSIS — I2510 Atherosclerotic heart disease of native coronary artery without angina pectoris: Secondary | ICD-10-CM

## 2011-04-26 DIAGNOSIS — I10 Essential (primary) hypertension: Secondary | ICD-10-CM

## 2011-04-26 MED ORDER — FUROSEMIDE 20 MG PO TABS: 20.0000 mg | ORAL_TABLET | Freq: Two times a day (BID) | ORAL | Status: DC

## 2011-04-27 ENCOUNTER — Other Ambulatory Visit: Payer: Self-pay | Admitting: *Deleted

## 2011-04-27 MED ORDER — DESLORATADINE 5 MG PO TABS: 5.0000 mg | ORAL_TABLET | Freq: Every day | ORAL | Status: DC

## 2011-04-27 NOTE — Telephone Encounter (Signed)
Refill approved - nurse to complete.

## 2011-04-30 NOTE — Telephone Encounter (Signed)
Clarinex rx called to [facility] at [facility].

## 2011-05-08 ENCOUNTER — Ambulatory Visit (INDEPENDENT_AMBULATORY_CARE_PROVIDER_SITE_OTHER): Payer: Self-pay | Admitting: Internal Medicine

## 2011-05-08 ENCOUNTER — Encounter: Payer: Self-pay | Admitting: Internal Medicine

## 2011-05-08 VITALS — BP 130/70 | HR 90 | Temp 97.9°F | Ht 64.0 in | Wt 204.4 lb

## 2011-05-08 DIAGNOSIS — E785 Hyperlipidemia, unspecified: Secondary | ICD-10-CM

## 2011-05-08 DIAGNOSIS — I10 Essential (primary) hypertension: Secondary | ICD-10-CM

## 2011-05-08 DIAGNOSIS — Z79899 Other long term (current) drug therapy: Secondary | ICD-10-CM

## 2011-05-08 DIAGNOSIS — D649 Anemia, unspecified: Secondary | ICD-10-CM

## 2011-05-08 DIAGNOSIS — E119 Type 2 diabetes mellitus without complications: Secondary | ICD-10-CM

## 2011-05-08 DIAGNOSIS — Z0000 Encounter for general adult medical examination without abnormal findings: Secondary | ICD-10-CM

## 2011-05-08 DIAGNOSIS — G8929 Other chronic pain: Secondary | ICD-10-CM

## 2011-05-08 DIAGNOSIS — J069 Acute upper respiratory infection, unspecified: Secondary | ICD-10-CM

## 2011-05-08 DIAGNOSIS — I2510 Atherosclerotic heart disease of native coronary artery without angina pectoris: Secondary | ICD-10-CM

## 2011-05-08 MED ORDER — CYANOCOBALAMIN 1000 MCG/ML IJ SOLN
1000.0000 ug | Freq: Once | INTRAMUSCULAR | Status: AC
  Administered 2011-05-08: 1000 ug via INTRAMUSCULAR

## 2011-05-08 MED ORDER — POTASSIUM CHLORIDE ER 10 MEQ PO TBCR: 20.0000 meq | EXTENDED_RELEASE_TABLET | Freq: Every day | ORAL | Status: DC

## 2011-05-08 NOTE — Assessment & Plan Note (Signed)
Repeat BP excellent today. No change.

## 2011-05-08 NOTE — Progress Notes (Signed)
  Subjective:    Patient ID: [name], female    DOB: 06-Oct-1945, 66 y.o.   MRN: [id number]  HPI  Please see the A&P for the status of the pt's chronic medical problems.   Review of Systems  Constitutional: Negative for fatigue.  HENT: Positive for congestion, rhinorrhea, sneezing, voice change and sinus pressure. Negative for sore throat and trouble swallowing.   Respiratory: Positive for cough. Negative for shortness of breath.   Cardiovascular: Positive for leg swelling.  Musculoskeletal: Positive for joint swelling, arthralgias and gait problem. Negative for back pain.  Psychiatric/Behavioral: Positive for sleep disturbance.       Objective:   Physical Exam  Constitutional: She is oriented to person, place, and time. She appears well-developed and well-nourished. No distress.  HENT:  Head: Normocephalic and atraumatic.  Right Ear: External ear normal.  Nose: Nose normal.  Mouth/Throat: Oropharynx is clear and moist.  Eyes: Conjunctivae are normal. Pupils are equal, round, and reactive to light.  Neck: Neck supple. No tracheal deviation present. No thyromegaly present.  Cardiovascular: Normal rate, regular rhythm and normal heart sounds.   No murmur heard. Pulmonary/Chest: Effort normal and breath sounds normal. No respiratory distress.  Musculoskeletal: She exhibits edema.  Lymphadenopathy:    She has no cervical adenopathy.  Neurological: She is alert and oriented to person, place, and time.  Skin: Skin is warm and dry. She is not diaphoretic.  Psychiatric: She has a normal mood and affect. Her behavior is normal. Judgment and thought content normal.          Assessment & Plan:

## 2011-05-08 NOTE — Assessment & Plan Note (Signed)
LDL at goal. Check lipid panel - not fasting today.

## 2011-05-08 NOTE — Assessment & Plan Note (Signed)
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 Cold sxs for three weeks. Productive cough, rhinorrhea, can't breathe esp at night, congestion, transient lose of voice. Has never had a cold last this long. Exam benign. No COPD/asthma hx. Has been taking all OTC meds - robitussin, delsym, coricidin, Nyquil. They help but sxs come back when she stops them. Almost certain viral with protracted course (Reinfxn from the kids?). Found the sugar free robitussin and rec short term afrin. TO call me if no better in one week.

## 2011-05-08 NOTE — Assessment & Plan Note (Signed)
Asymptomatic. Sees Dr Johnsie Cancel. On plavix and ASA & BB & statin.

## 2011-05-08 NOTE — Assessment & Plan Note (Signed)
UTD on all except zostavax - likely money issue as no insurance.

## 2011-05-08 NOTE — Assessment & Plan Note (Signed)
Lab Results  Component Value Date   HGBA1C 8.4 05/08/2011   Checks CBG once daily. No log / meter. A1C slow trend up. On Glucotrol, Lantus 30 QHS, and Novolog before lunch. Denies lows. Seems to be well controlled CBG's here in AM. Without meter, I am hesitant to increase insulin. Have asked that she bring meter next visit. May just need to add novolog prior to all meals. Could ask her to do intensive testing prior to next visit. Will send me a flag to ask her to do that prior to next visit.  UTD on all other DM HM.

## 2011-05-08 NOTE — Assessment & Plan Note (Signed)
Sig and increasing L knee pain. Preventing her from doing her work. Medial compartment. No tenderness to palp. No crepitus on exam but limited extension 2/2 pain. No locking, giving out, clicks. Takes aleve and gets mild relief. Has responded to steroid injection previously. Will refer to sports med.

## 2011-05-08 NOTE — Assessment & Plan Note (Signed)
B12 injection today. Check CBC today as last checked one month ago.

## 2011-05-08 NOTE — Patient Instructions (Signed)
Robitussin sugar free cough and chest congestion DM Afrin - to open up nose - only use for a couple of days Call me if no better in one week.

## 2011-05-09 ENCOUNTER — Encounter: Payer: Self-pay | Admitting: Internal Medicine

## 2011-05-09 LAB — LIPID PANEL
Cholesterol: 129 mg/dL (ref 0–200)
LDL Cholesterol: 31 mg/dL (ref 0–99)
Total CHOL/HDL Ratio: 3.3 Ratio
VLDL: 59 mg/dL — ABNORMAL HIGH (ref 0–40)

## 2011-05-09 LAB — CBC
MCH: 24.7 pg — ABNORMAL LOW (ref 26.0–34.0)
MCV: 80.7 fL (ref 78.0–100.0)
Platelets: 292 10*3/uL (ref 150–400)
RBC: 3.84 MIL/uL — ABNORMAL LOW (ref 3.87–5.11)
RDW: 15.5 % (ref 11.5–15.5)

## 2011-05-15 ENCOUNTER — Ambulatory Visit (INDEPENDENT_AMBULATORY_CARE_PROVIDER_SITE_OTHER): Payer: Self-pay | Admitting: Family Medicine

## 2011-05-15 VITALS — BP 160/70

## 2011-05-15 DIAGNOSIS — M1710 Unilateral primary osteoarthritis, unspecified knee: Secondary | ICD-10-CM

## 2011-05-15 NOTE — Progress Notes (Signed)
Patient Name: [name] Date of Birth: [birth year] Medical Record Number: [id number] Gender: female Date of Encounter: 05/15/2011  History of Present Illness:  [name] is a 66 y.o. very pleasant female patient who presents with the following:  Pleasant patient with a history of arthritis who presents with bilateral knee pain, LEFT greater than RIGHT. 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  Is mainly having some pain with deep flexion of the knee when going down on her knee. She has not had any mechanical locking up. She does have some crepitus and occasional swelling, particularly the LEFT knee.  She does have a history of prior arthroscopy of the LEFT knee.  Patient Active Problem List  Diagnoses  . Hypertension  . CAD (coronary artery disease)  . Abdominal discomfort  . Anemia  . Stroke  . Hyperlipidemia  . Chronic pain  . Chronic kidney disease  . Adnexal mass  . DM (diabetes mellitus), type 2  . Seasonal allergies  . Health care maintenance  . URI (upper respiratory infection)   Past Medical History  Diagnosis Date  . Hypertension     2006 B renal arteries patent. 2003 MRA - no RAS. 2003 pheo W/U Dr FHassell Donereportedly negative.  .Marland KitchenCAD (coronary artery disease) 1996    1996 - PTCA and angioplasty diagonal branch. 2000 - Rotoblator & angiopllasty of diagonal. 2006 - subendocardial AMI, DES to proximal LAD..Marland KitchenAlso had 90% stenosis in distal apical LAD. EF 55 with apical hypokinesis. Indefinite ASA and Plavix.  . Abdominal discomfort     Chronic N/V/D. Presumptive dx Crohn's dx per elevated p ANCA. Failed Entocort and Pentasa. Sep 2003 - ileocolectomy c anastomosis per Dr BDeon Pilling2/2 adhesions - path was hegative for Crohns. EGD, Sm bowel follow through (11/03), and an eteroclysis (10/03) were unrevealing. Cuases hypomag and hypocalcemia.  . Anemia     Multifactorial. Baseline HgB 10-11 ish. B12 def - 150 in  3/10. Fe Def - ferritin 35 3/10. Both are being repleted.  . Stroke     Incidental finding MRI 2002 L lacunar infarct  . Diabetes mellitus     Insulin dependent  . Hyperlipidemia   . Allergy     Seasonal  . Chronic pain     CT 10/10 = Spinal stenosis L2 - S1.  .Marland KitchenChronic kidney disease     Chronic renal insuff baseline Cr 1.2 - 1.4 ish.  . Adnexal mass 8/03    s/p lap BSO (R ovarian fibroma) & lysis of adhesions   Past Surgical History  Procedure Date  . Ptca 4/06  . Bowel resection 2003    ileocolectomy with anastomosis 2/2 adhesions  . Cholecystectomy   . Hernia repair     Ventral hernia repair  . Bilateral salpingoophorectomy 8/03    Lap BSO (R ovarian fibroma) and adhesion lysis  . Hemicolectomy     R sided hemicolectomy   History  Substance Use Topics  . Smoking status: Never Smoker   . Smokeless tobacco: Never Used  . Alcohol Use: No   Family History  Problem Relation Age of Onset  . Cancer Brother 656   pancreatic  . Cancer Mother   . Hypertension Daughter    Allergies  Allergen Reactions  . Diazepam     REACTION: Agitation  . Haloperidol Lactate   . Lorazepam Nausea Only  . Morphine Sulfate     REACTION: Agitation  .  Propoxyphene Hcl     REACTION: Vomiting  . Tramadol Hcl     REACTION: sick on stomach    Medication list has been reviewed and updated.  Review of Systems:  GEN: No fevers, chills. Nontoxic. Primarily MSK c/o today. MSK: Detailed in the HPI GI: tolerating PO intake without difficulty Neuro: No numbness, parasthesias, or tingling associated. Otherwise the pertinent positives of the ROS are noted above.    Physical Examination: Filed Vitals:   05/15/11 1552  BP: 160/70    There is no height or weight on file to calculate BMI.   GEN: WDWN, NAD, Non-toxic, Alert & Oriented x 3 HEENT: Atraumatic, Normocephalic.  Ears and Nose: No external deformity. EXTR: No clubbing/cyanosis/edema NEURO: Normal gait.  PSYCH: Normally  interactive. Conversant. Not depressed or anxious appearing.  Calm demeanor.   Knee:  l Gait: Normal heel toe pattern ROM: 0-115 Effusion: mild Echymosis or edema: none Patellar tendon NT Painful PLICA: neg Patellar grind: negative Medial and lateral patellar facet loading: negative medial and lateral joint lines: mild medial and lateral Mcmurray's neg Flexion-pinch neg Varus and valgus stress: stable Lachman: neg Ant and Post drawer: neg Hip abduction, IR, ER: WNL Hip flexion str: 5/5 Hip abd: 5/5 Quad: 5/5 VMO atrophy:No Hamstring concentric and eccentric: 5/5   Assessment and Plan: 1. Arthritis of knee  DG Knee Bilateral Standing AP, DG Knee 1-2 Views Left    The patient is on Plavix. Minimal options in terms of her NSAIDs and she did do, and the patient also has some renal insufficiency.  Unfortunately, she is also had some difficulty with pain medications as well.  For now, we'll try to inject her knee and see if this gives her an asymptomatic relief.  Knee Injection, L Patient verbally consented to procedure. Risks (including potential rare risk of infection), benefits, and alternatives explained. Sterilely prepped with Chloraprep. Ethyl cholride used for anesthesia. 9 cc Lidocaine 1% mixed with 1 cc of Depo-Medrol 40 mg injected using the anterolateral approach without difficulty. No complications with procedure and tolerated well. Patient had decreased pain post-injection.

## 2011-05-16 ENCOUNTER — Telehealth: Payer: Self-pay | Admitting: *Deleted

## 2011-05-16 MED ORDER — ATENOLOL 50 MG PO TABS: 50.0000 mg | ORAL_TABLET | Freq: Two times a day (BID) | ORAL | Status: DC

## 2011-05-16 MED ORDER — AMOXICILLIN-POT CLAVULANATE 875-125 MG PO TABS: 1.0000 | ORAL_TABLET | Freq: Two times a day (BID) | ORAL | Status: AC

## 2011-05-16 NOTE — Telephone Encounter (Signed)
Call from pt said that her URI has not gotten better.  Thinks it may be a Sinus infection.  Blood tinged mucous from the nose.  Said that she was told to call in if no improvement.  [facility]  Would like for her Atenolol to be sent to [facility] on [address] if possible.  [facility] not get [facility] the [facility].  Sander Nephew, RN 05/16/2011 9:25 PM

## 2011-05-16 NOTE — Telephone Encounter (Signed)
She has tried OTC symptomatic tx for about 4 weeks now. I did tell her to call me. Will try ABX. Please tell her to let me know if [facility]'t carry the ABX I rx'd. Please call in. Thanks  Sent atenolol to [facility]

## 2011-05-17 ENCOUNTER — Ambulatory Visit (HOSPITAL_COMMUNITY)
Admission: RE | Admit: 2011-05-17 | Discharge: 2011-05-17 | Disposition: A | Discharge status: Home / Self Care | Payer: Self-pay | Source: Ambulatory Visit | Attending: Family Medicine | Admitting: Family Medicine

## 2011-05-17 DIAGNOSIS — M25569 Pain in unspecified knee: Secondary | ICD-10-CM | POA: Insufficient documentation

## 2011-05-17 DIAGNOSIS — M1710 Unilateral primary osteoarthritis, unspecified knee: Secondary | ICD-10-CM

## 2011-05-18 ENCOUNTER — Telehealth: Payer: Self-pay | Admitting: *Deleted

## 2011-05-18 NOTE — Telephone Encounter (Signed)
Message copied by Ocie Bob on Fri May 18, 2011  9:32 AM ------      Message from: Owens Loffler      Created: Thu May 17, 2011  8:22 PM       Call            xrays of the knee really look pretty good      Why don't you have her f/u in 3-4 weeks with me to recheck

## 2011-05-18 NOTE — Telephone Encounter (Signed)
Spoke with pt- gave her x-ray results.  Scheduled her for f/u with Dr. Lorelei Pont 06/12/11 @ 330.  She states her knee is feeling some improvement since injection.

## 2011-06-12 ENCOUNTER — Ambulatory Visit: Payer: Self-pay | Admitting: Family Medicine

## 2011-06-12 ENCOUNTER — Encounter: Payer: Self-pay | Admitting: Family Medicine

## 2011-06-12 ENCOUNTER — Ambulatory Visit (INDEPENDENT_AMBULATORY_CARE_PROVIDER_SITE_OTHER): Payer: Self-pay | Admitting: Family Medicine

## 2011-06-12 VITALS — BP 136/75 | HR 70

## 2011-06-12 DIAGNOSIS — M1710 Unilateral primary osteoarthritis, unspecified knee: Secondary | ICD-10-CM

## 2011-06-12 NOTE — Progress Notes (Signed)
  Patient Name: [name] Date of Birth: 20-Jun-1945 Age: 66 y.o. Medical Record Number: [id number] Gender: female Date of Encounter: 06/12/2011  History of Present Illness:  [name] is a 66 y.o. very pleasant female patient who presents with the following:  followup RIGHT knee pain, arthritis exacerbation. I did an intra-articular injection of corticosteroid on her last office visit. The patient is on Plavix and can't take any anti-inflammatories. She is doing quite a bit better about 80-90% better on the RIGHT and only has some occasional aching pain. Overall she is much improved.  Past Medical History, Surgical History, Social History, Family History, Problem List, Medications, and Allergies have been reviewed and updated if relevant.  Review of Systems:  GEN: No fevers, chills. Nontoxic. Primarily MSK c/o today. MSK: Detailed in the HPI GI: tolerating PO intake without difficulty Neuro: No numbness, parasthesias, or tingling associated. Otherwise the pertinent positives of the ROS are noted above.    Physical Examination: Filed Vitals:   06/12/11 1548  BP: 136/75  Pulse: 70    There is no height or weight on file to calculate BMI.   GEN: WDWN, NAD, Non-toxic, Alert & Oriented x 3 HEENT: Atraumatic, Normocephalic.  Ears and Nose: No external deformity. EXTR: No clubbing/cyanosis/edema NEURO: Normal gait.  PSYCH: Normally interactive. Conversant. Not depressed or anxious appearing.  Calm demeanor.   RIGHT knee: No effusion. Nontender along medial and lateral joint lines. Nontender at the patella. Stable MCL, LCL, ACL, PCL. Negative McMurray's test. Negative bounce home test. Negative Lachman.  Assessment and Plan:  1. Arthritis of knee     Much improved. Tylenol p.r.n. Ice p.r.n. She will use any form of topical liniment if she chooses.  Follow prn

## 2011-07-16 ENCOUNTER — Telehealth: Payer: Self-pay | Admitting: Cardiovascular Disease

## 2011-07-16 NOTE — Telephone Encounter (Signed)
New Problem:     I called the patient and was unable to reach them. I left a message on their voicemail with my name, the reason I called, the name of his physician, and a number to call back to schedule their appointment.

## 2011-07-24 ENCOUNTER — Other Ambulatory Visit: Payer: Self-pay | Admitting: *Deleted

## 2011-07-24 DIAGNOSIS — E785 Hyperlipidemia, unspecified: Secondary | ICD-10-CM

## 2011-07-25 MED ORDER — ATORVASTATIN CALCIUM 20 MG PO TABS: 20.0000 mg | ORAL_TABLET | Freq: Every day | ORAL | Status: DC

## 2011-07-26 ENCOUNTER — Ambulatory Visit: Payer: Self-pay

## 2011-08-27 ENCOUNTER — Ambulatory Visit (INDEPENDENT_AMBULATORY_CARE_PROVIDER_SITE_OTHER): Payer: Self-pay | Admitting: Cardiovascular Disease

## 2011-08-27 ENCOUNTER — Encounter: Payer: Self-pay | Admitting: Cardiovascular Disease

## 2011-08-27 VITALS — BP 135/74 | HR 68 | Ht 65.0 in | Wt 199.0 lb

## 2011-08-27 DIAGNOSIS — E785 Hyperlipidemia, unspecified: Secondary | ICD-10-CM

## 2011-08-27 DIAGNOSIS — I10 Essential (primary) hypertension: Secondary | ICD-10-CM

## 2011-08-27 DIAGNOSIS — E119 Type 2 diabetes mellitus without complications: Secondary | ICD-10-CM

## 2011-08-27 DIAGNOSIS — I2510 Atherosclerotic heart disease of native coronary artery without angina pectoris: Secondary | ICD-10-CM

## 2011-08-27 NOTE — Progress Notes (Signed)
Patient ID: [name], female   DOB: Mar 22, 1945, 66 y.o.   MRN: [id number] [name] is seen today for F/U of CAD with pevious rotoblator to the LAD in 2000. Her last myovue on 11/2006 showed mild apical thinning and was low risk. She has not had any SSCP and has been compliant with her meds.Her BP is alwyas hard to Rx and she is on multiple drugs with persistant mild tachycardia. She denies dyspnea, PND orthopnea. Has some swelling in her feet. Diet still an issue with her DM.  Had a nice trip to the [place] with her daughter last month  Discussed Accelerate Trial with her and she declined.  ON lipitor with HDL 39 and LDL 31   ROS: Denies fever, malais, weight loss, blurry vision, decreased visual acuity, cough, sputum, SOB, hemoptysis, pleuritic pain, palpitaitons, heartburn, abdominal pain, melena, lower extremity edema, claudication, or rash.  All other systems reviewed and negative  General: Affect appropriate Overweight black female HEENT: normal Neck supple with no adenopathy JVP normal no bruits no thyromegaly Lungs clear with no wheezing and good diaphragmatic motion Heart:  S1/S2 no murmur, no rub, gallop or click PMI normal Abdomen: benighn, BS positve, no tenderness, no AAA no bruit.  No HSM or HJR Distal pulses intact with no bruits Trace bilateral  edema Neuro non-focal Skin warm and dry No muscular weakness   Current Outpatient Prescriptions  Medication Sig Dispense Refill  . 1st Choice Lancets Thin MISC by Does not apply route 2 (two) times daily.        Marland Kitchen amLODipine (NORVASC) 10 MG tablet Take 1 tablet (10 mg total) by mouth daily.  90 tablet  3  . aspirin 81 MG tablet Take 81 mg by mouth daily.        Marland Kitchen atenolol (TENORMIN) 50 MG tablet Take 1 tablet (50 mg total) by mouth 2 (two) times daily.  60 tablet  11  . atorvastatin (LIPITOR) 20 MG tablet Take 1 tablet (20 mg total) by mouth at bedtime.  90 tablet  3  . clopidogrel (PLAVIX) 75 MG tablet Take 1 tablet (75 mg  total) by mouth daily.  90 tablet  3  . colesevelam (WELCHOL) 625 MG tablet Take 3 tablets (1,875 mg total) by mouth daily.  540 tablet  1  . cyanocobalamin 1000 MCG/ML injection Inject 1,000 mcg into the muscle every 30 (thirty) days.       Marland Kitchen desloratadine (CLARINEX) 5 MG tablet Take 1 tablet (5 mg total) by mouth daily.  90 tablet  1  . ferrous sulfate 325 (65 FE) MG tablet Take 325 mg by mouth 3 (three) times daily with meals.        . furosemide (LASIX) 20 MG tablet Take 1 tablet (20 mg total) by mouth 2 (two) times daily.  60 tablet  11  . glipiZIDE (GLUCOTROL) 10 MG tablet Take 2 tablets (20 mg total) by mouth 2 (two) times daily before a meal.  400 tablet  3  . glucose blood (TRUETRACK TEST) test strip 1 each by Other route 2 (two) times daily. Use as instructed       . hydrOXYzine (VISTARIL) 50 MG capsule Take 1 capsule (50 mg total) by mouth at bedtime as needed.  90 capsule  3  . insulin aspart (NOVOLOG) 100 UNIT/ML injection Inject 2 units before lunch every day.  10 mL  12  . insulin glargine (LANTUS) 100 UNIT/ML injection Inject 30 Units into the skin at bedtime.  30 mL  3  . Insulin Syringe-Needle U-100 (INSULIN SYRINGE 1CC/28G) 28G X 1/2" 1 ML MISC by Does not apply route.        . nitroGLYCERIN (NITROSTAT) 0.4 MG SL tablet Place 1 tablet (0.4 mg total) under the tongue every 5 (five) minutes as needed.  90 tablet  3  . potassium chloride (K-DUR) 10 MEQ tablet Take 2 tablets (20 mEq total) by mouth daily.  60 tablet  11  . quinapril (ACCUPRIL) 10 MG tablet Take 1 tablet (10 mg total) by mouth daily.  90 tablet  2   Current Facility-Administered Medications  Medication Dose Route Frequency Provider Last Rate Last Dose  . cyanocobalamin ((VITAMIN B-12)) injection 1,000 mcg  1,000 mcg Intramuscular Once Bartholomew Crews, MD        Allergies  Diazepam; Haloperidol lactate; Lorazepam; Morphine sulfate; Propoxyphene hcl; and Tramadol hcl  Electrocardiogram:  Assessment and  Plan

## 2011-08-27 NOTE — Patient Instructions (Signed)
Your physician wants you to follow-up in: YEAR WITH DR [name]  You will receive a reminder letter in the mail two months in advance. If you don't receive a letter, please call our office to schedule the follow-up appointment.  Your physician recommends that you continue on your current medications as directed. Please refer to the Current Medication list given to you today. 

## 2011-08-27 NOTE — Assessment & Plan Note (Signed)
Cholesterol is at goal.  Continue current dose of statin and diet Rx.  No myalgias or side effects.  F/U  LFT's in 6 months. Lab Results  Component Value Date   LDLCALC 31 05/08/2011

## 2011-08-27 NOTE — Assessment & Plan Note (Signed)
Stable with no angina and good activity level.  Continue medical Rx  

## 2011-08-27 NOTE — Assessment & Plan Note (Signed)
Well controlled.  Continue current medications and low sodium Dash type diet.    

## 2011-08-27 NOTE — Assessment & Plan Note (Signed)
Discussed low carb diet.  Target hemoglobin A1c is 6.5 or less.  Continue current medications. Still issues with her diet that are chronic

## 2011-11-05 ENCOUNTER — Other Ambulatory Visit: Payer: Self-pay | Admitting: *Deleted

## 2011-11-05 MED ORDER — DESLORATADINE 5 MG PO TABS: 5.0000 mg | ORAL_TABLET | Freq: Every day | ORAL | Status: DC

## 2011-11-05 MED ORDER — AMLODIPINE BESYLATE 10 MG PO TABS: 10.0000 mg | ORAL_TABLET | Freq: Every day | ORAL | Status: DC

## 2011-11-06 ENCOUNTER — Telehealth: Payer: Self-pay | Admitting: Dietician

## 2011-11-06 NOTE — Telephone Encounter (Signed)
Called to explain how to check blood sugar intensively for 3 days- patient wanted this explained in person. Appointment made for Tuesday 11/13/11 with CDE

## 2011-11-06 NOTE — Telephone Encounter (Signed)
Rx faxed in.

## 2011-11-13 ENCOUNTER — Ambulatory Visit (INDEPENDENT_AMBULATORY_CARE_PROVIDER_SITE_OTHER): Payer: Self-pay | Admitting: Dietician

## 2011-11-13 DIAGNOSIS — E119 Type 2 diabetes mellitus without complications: Secondary | ICD-10-CM

## 2011-11-13 NOTE — Progress Notes (Signed)
Diabetes Self-Management Training (DSMT)  Follow-Up 4 Visit  11/13/2011 [name], identified by name and date of birth, is a 66 y.o. female with Type 2 Diabetes.  ASSESSMENT Patient concerns are Monitoring.  There were no vitals taken for this visit. There is no height or weight on file to calculate BMI.  Lab Results  Component Value Date   HGBA1C 8.4 05/08/2011    Self-Monitoring Patient says she has a meter, knows hoe to use and retreive data from it.   Assessment comments:      INDIVIDUAL DIABETES EDUCATION PLAN:  Monitoring _______________________________________________________________________  Intervention TOPICS COVERED TODAY:  Monitoring  Purpose and frequency of SMBG. Taught/discussed recording of test results and interpretation of SMBG. Identified appropriate SMBG and A1C goals.  PATIENTS GOALS/PLAN (copy and paste in patient instructions so patient receives a copy): 1.  Learning Objective:       Know how to perform a 3 day intensive glucose log.  2.  Behavioral Objective:         Monitoring: To identify blood glucose trends, I will test blood glucose pre and post meals for 3 days and bring in on september 24th Never 0%  Personalized Follow-Up Plan for Ongoing Self Management Support:  Vici, friends, family and CDE visits ______________________________________________________________________   Outcomes Expected outcomes: Demonstrated interest in learning.Expect positive changes in lifestyle. Self-care Barriers: Lack of material resources Education material provided: yes Patient to contact team via Phone if problems or questions. Time in: 1400     Time out: 1415 Future DSMT - PRN   Luwanda Starr, [name]

## 2011-11-15 ENCOUNTER — Other Ambulatory Visit: Payer: Self-pay | Admitting: *Deleted

## 2011-11-15 DIAGNOSIS — I10 Essential (primary) hypertension: Secondary | ICD-10-CM

## 2011-11-15 DIAGNOSIS — E119 Type 2 diabetes mellitus without complications: Secondary | ICD-10-CM

## 2011-11-15 MED ORDER — QUINAPRIL HCL 10 MG PO TABS: 10.0000 mg | ORAL_TABLET | Freq: Every day | ORAL | Status: DC

## 2011-11-16 ENCOUNTER — Other Ambulatory Visit: Payer: Self-pay | Admitting: *Deleted

## 2011-11-16 MED ORDER — INSULIN GLARGINE 100 UNIT/ML ~~LOC~~ SOLN: 30.0000 [IU] | Freq: Every day | SUBCUTANEOUS | Status: DC

## 2011-11-16 NOTE — Telephone Encounter (Signed)
Lantus rx called to [facility].

## 2011-11-16 NOTE — Telephone Encounter (Signed)
A1C trending up. Has appt later in Sep - can recheck A1C and escalate tx as needed.

## 2011-11-20 NOTE — Telephone Encounter (Signed)
Called to pharm 

## 2011-12-06 ENCOUNTER — Other Ambulatory Visit: Payer: Self-pay | Admitting: *Deleted

## 2011-12-06 MED ORDER — COLESEVELAM HCL 625 MG PO TABS: 1875.0000 mg | ORAL_TABLET | Freq: Every day | ORAL | Status: DC

## 2011-12-06 NOTE — Telephone Encounter (Signed)
Rx called in to pharmacy. 

## 2011-12-06 NOTE — Telephone Encounter (Signed)
Last seen Feb. Has appt later this month

## 2011-12-11 ENCOUNTER — Encounter: Payer: Self-pay | Admitting: Internal Medicine

## 2011-12-11 ENCOUNTER — Ambulatory Visit (INDEPENDENT_AMBULATORY_CARE_PROVIDER_SITE_OTHER): Payer: Self-pay | Admitting: Internal Medicine

## 2011-12-11 VITALS — BP 138/66 | HR 68 | Temp 98.2°F | Ht 64.0 in | Wt 199.5 lb

## 2011-12-11 DIAGNOSIS — E785 Hyperlipidemia, unspecified: Secondary | ICD-10-CM

## 2011-12-11 DIAGNOSIS — D649 Anemia, unspecified: Secondary | ICD-10-CM

## 2011-12-11 DIAGNOSIS — I129 Hypertensive chronic kidney disease with stage 1 through stage 4 chronic kidney disease, or unspecified chronic kidney disease: Secondary | ICD-10-CM

## 2011-12-11 DIAGNOSIS — I2510 Atherosclerotic heart disease of native coronary artery without angina pectoris: Secondary | ICD-10-CM

## 2011-12-11 DIAGNOSIS — E119 Type 2 diabetes mellitus without complications: Secondary | ICD-10-CM

## 2011-12-11 DIAGNOSIS — Z0000 Encounter for general adult medical examination without abnormal findings: Secondary | ICD-10-CM

## 2011-12-11 DIAGNOSIS — N189 Chronic kidney disease, unspecified: Secondary | ICD-10-CM

## 2011-12-11 DIAGNOSIS — R109 Unspecified abdominal pain: Secondary | ICD-10-CM

## 2011-12-11 DIAGNOSIS — Z79899 Other long term (current) drug therapy: Secondary | ICD-10-CM

## 2011-12-11 DIAGNOSIS — Z23 Encounter for immunization: Secondary | ICD-10-CM

## 2011-12-11 DIAGNOSIS — I639 Cerebral infarction, unspecified: Secondary | ICD-10-CM

## 2011-12-11 DIAGNOSIS — I10 Essential (primary) hypertension: Secondary | ICD-10-CM

## 2011-12-11 LAB — BASIC METABOLIC PANEL WITH GFR
BUN: 27 mg/dL — ABNORMAL HIGH (ref 6–23)
Calcium: 9.2 mg/dL (ref 8.4–10.5)
Chloride: 107 mEq/L (ref 96–112)
Creat: 1.16 mg/dL — ABNORMAL HIGH (ref 0.50–1.10)
GFR, Est African American: 57 mL/min — ABNORMAL LOW
GFR, Est Non African American: 50 mL/min — ABNORMAL LOW

## 2011-12-11 LAB — CBC
HCT: 28.3 % — ABNORMAL LOW (ref 36.0–46.0)
MCHC: 32.2 g/dL (ref 30.0–36.0)
Platelets: 268 10*3/uL (ref 150–400)
RDW: 15.5 % (ref 11.5–15.5)
WBC: 10.4 10*3/uL (ref 4.0–10.5)

## 2011-12-11 MED ORDER — CYANOCOBALAMIN 1000 MCG/ML IJ SOLN
1000.0000 ug | Freq: Once | INTRAMUSCULAR | Status: AC
  Administered 2011-12-11: 1000 ug via INTRAMUSCULAR

## 2011-12-11 MED ORDER — INSULIN GLARGINE 100 UNIT/ML ~~LOC~~ SOLN: 34.0000 [IU] | Freq: Every day | SUBCUTANEOUS | Status: DC

## 2011-12-11 MED ORDER — CLOPIDOGREL BISULFATE 75 MG PO TABS: 75.0000 mg | ORAL_TABLET | Freq: Every day | ORAL | Status: DC

## 2011-12-11 NOTE — Assessment & Plan Note (Signed)
Great control. No side effects from meds.

## 2011-12-11 NOTE — Assessment & Plan Note (Signed)
Asymptomatic. Cont to see Dr Johnsie Cancel. On ASA, plavix, BB, and ACEI. LDL great. BP great. I need to do better on her A1C.

## 2011-12-11 NOTE — Assessment & Plan Note (Signed)
ASA & Plavix. RF mgmt.

## 2011-12-11 NOTE — Addendum Note (Signed)
Addended by: Sander Nephew F on: 12/11/2011 02:26 PM   Modules accepted: Orders

## 2011-12-11 NOTE — Assessment & Plan Note (Signed)
HgB low but stable over past 5 yrs (only 1 gram drop). On B12 and FeSO4. Colon UTD. Will recheck CBC today. Will need ferritin after April, when she gets medicare.

## 2011-12-11 NOTE — Patient Instructions (Signed)
Increase your Lantus to 34 units before bed.   Call me in one week and let me know how your sugars are doing.  I have sent refills into your pharmacy.  See me in 3 months

## 2011-12-11 NOTE — Assessment & Plan Note (Signed)
No further flares.

## 2011-12-11 NOTE — Assessment & Plan Note (Signed)
On statin and Welchol and FLP  - LDL great but HDL not as high as would prefer. Cont meds.

## 2011-12-11 NOTE — Assessment & Plan Note (Signed)
Flu shot today. Get optho results. Foot exam. No payment method to get zostavax.

## 2011-12-11 NOTE — Progress Notes (Signed)
  Subjective:    Patient ID: [name], female    DOB: 14-Jun-1945, 66 y.o.   MRN: [id number]  HPI  Please see the A&P for the status of the pt's chronic medical problems.   Review of Systems  Constitutional: Positive for fatigue. Negative for appetite change and unexpected weight change.  HENT: Negative for congestion, rhinorrhea, sneezing and sinus pressure.   Eyes: Negative for itching and visual disturbance.  Respiratory: Negative for cough and shortness of breath.   Cardiovascular: Positive for chest pain and leg swelling.  Gastrointestinal: Negative for nausea, vomiting, diarrhea and abdominal distention.  Genitourinary: Negative for dysuria and difficulty urinating.  Musculoskeletal: Negative for myalgias, back pain and joint swelling.  Neurological: Negative for dizziness and headaches.  Psychiatric/Behavioral: Negative for disturbed wake/sleep cycle.       Objective:   Physical Exam  Constitutional: She is oriented to person, place, and time. She appears well-developed and well-nourished. No distress.  HENT:  Head: Normocephalic and atraumatic.  Right Ear: External ear normal.  Left Ear: External ear normal.  Nose: Nose normal.  Eyes: Conjunctivae normal and EOM are normal.  Neck: Normal range of motion. No tracheal deviation present.  Cardiovascular: Normal rate, regular rhythm and normal heart sounds.   No murmur heard.      No carotid bruits  Pulmonary/Chest: Effort normal and breath sounds normal. No respiratory distress.  Musculoskeletal: Normal range of motion. She exhibits edema.       Trace edema on L pre-tibial  Neurological: She is alert and oriented to person, place, and time.  Skin: Skin is warm and dry. No rash noted. She is not diaphoretic. No erythema. No pallor.  Psychiatric: She has a normal mood and affect. Her behavior is normal. Judgment and thought content normal.          Assessment & Plan:

## 2011-12-11 NOTE — Assessment & Plan Note (Signed)
Cr high but stable. Will check BMP today.

## 2011-12-11 NOTE — Assessment & Plan Note (Addendum)
A1C still elevated at 8.4. CBG meter - all elevated all day long. Will increase LAntus to 24 and she will call in one week to report CBG's. Next step would be further increase in Lantus versus increasing prandial insulin.

## 2011-12-25 ENCOUNTER — Other Ambulatory Visit: Payer: Self-pay | Admitting: Internal Medicine

## 2011-12-26 ENCOUNTER — Encounter: Payer: Self-pay | Admitting: Internal Medicine

## 2011-12-27 ENCOUNTER — Telehealth: Payer: Self-pay | Admitting: Dietician

## 2011-12-27 NOTE — Telephone Encounter (Signed)
Patient reports that her blood sugars are doing better on new dose of lantus. She was driving so could not give SMBG numbers, she agreed to call back later and leave numbers on voicemail so I can give these to Dr. Lynnae January.

## 2011-12-27 NOTE — Telephone Encounter (Signed)
Thank you :)

## 2012-05-09 ENCOUNTER — Other Ambulatory Visit: Payer: Self-pay | Admitting: Internal Medicine

## 2012-05-12 ENCOUNTER — Encounter: Payer: Self-pay | Admitting: Internal Medicine

## 2012-05-12 NOTE — Telephone Encounter (Signed)
Pls make appt. Last seen 11/2011

## 2012-05-30 ENCOUNTER — Other Ambulatory Visit: Payer: Self-pay | Admitting: *Deleted

## 2012-05-30 MED ORDER — HYDROXYZINE PAMOATE 50 MG PO CAPS: 50.0000 mg | ORAL_CAPSULE | Freq: Every evening | ORAL | Status: DC | PRN

## 2012-05-30 NOTE — Telephone Encounter (Signed)
Rx called in to pharmacy. 

## 2012-05-30 NOTE — Telephone Encounter (Signed)
Has ppt next week. My notes indicate she uses this for sleep. Will refill

## 2012-06-03 ENCOUNTER — Encounter: Payer: Self-pay | Admitting: Internal Medicine

## 2012-06-06 ENCOUNTER — Other Ambulatory Visit: Payer: Self-pay | Admitting: *Deleted

## 2012-06-06 DIAGNOSIS — E119 Type 2 diabetes mellitus without complications: Secondary | ICD-10-CM

## 2012-06-06 DIAGNOSIS — I10 Essential (primary) hypertension: Secondary | ICD-10-CM

## 2012-06-06 MED ORDER — GLIPIZIDE 10 MG PO TABS: 20.0000 mg | ORAL_TABLET | Freq: Two times a day (BID) | ORAL | Status: DC

## 2012-06-06 MED ORDER — QUINAPRIL HCL 10 MG PO TABS: 10.0000 mg | ORAL_TABLET | Freq: Every day | ORAL | Status: DC

## 2012-06-06 NOTE — Telephone Encounter (Signed)
Has appt with me early April. Will refill one year. [facility] Thanks

## 2012-06-06 NOTE — Telephone Encounter (Signed)
Both Rx called in to pharmacy. 

## 2012-06-23 ENCOUNTER — Other Ambulatory Visit: Payer: Self-pay | Admitting: Internal Medicine

## 2012-06-24 ENCOUNTER — Encounter: Payer: Self-pay | Admitting: Internal Medicine

## 2012-06-30 ENCOUNTER — Encounter: Payer: Self-pay | Admitting: Internal Medicine

## 2012-07-01 ENCOUNTER — Encounter: Payer: Self-pay | Admitting: Internal Medicine

## 2012-07-01 ENCOUNTER — Ambulatory Visit (INDEPENDENT_AMBULATORY_CARE_PROVIDER_SITE_OTHER): Payer: Medicare Other | Admitting: Internal Medicine

## 2012-07-01 VITALS — BP 135/65 | HR 61 | Temp 97.7°F | Ht 64.0 in | Wt 202.0 lb

## 2012-07-01 DIAGNOSIS — E785 Hyperlipidemia, unspecified: Secondary | ICD-10-CM

## 2012-07-01 DIAGNOSIS — R109 Unspecified abdominal pain: Secondary | ICD-10-CM

## 2012-07-01 DIAGNOSIS — I10 Essential (primary) hypertension: Secondary | ICD-10-CM

## 2012-07-01 DIAGNOSIS — Z0000 Encounter for general adult medical examination without abnormal findings: Secondary | ICD-10-CM

## 2012-07-01 DIAGNOSIS — E1059 Type 1 diabetes mellitus with other circulatory complications: Secondary | ICD-10-CM

## 2012-07-01 DIAGNOSIS — D649 Anemia, unspecified: Secondary | ICD-10-CM

## 2012-07-01 DIAGNOSIS — J309 Allergic rhinitis, unspecified: Secondary | ICD-10-CM

## 2012-07-01 DIAGNOSIS — J302 Other seasonal allergic rhinitis: Secondary | ICD-10-CM

## 2012-07-01 DIAGNOSIS — I2510 Atherosclerotic heart disease of native coronary artery without angina pectoris: Secondary | ICD-10-CM

## 2012-07-01 DIAGNOSIS — E1165 Type 2 diabetes mellitus with hyperglycemia: Secondary | ICD-10-CM

## 2012-07-01 DIAGNOSIS — E1129 Type 2 diabetes mellitus with other diabetic kidney complication: Secondary | ICD-10-CM

## 2012-07-01 LAB — POCT GLYCOSYLATED HEMOGLOBIN (HGB A1C): Hemoglobin A1C: 8.2

## 2012-07-01 LAB — LIPID PANEL
Cholesterol: 160 mg/dL (ref 0–200)
Total CHOL/HDL Ratio: 4.3 Ratio
Triglycerides: 401 mg/dL — ABNORMAL HIGH (ref <150)

## 2012-07-01 LAB — VITAMIN B12: Vitamin B-12: >2000 pg/mL — ABNORMAL HIGH (ref 211–911)

## 2012-07-01 MED ORDER — CYANOCOBALAMIN 1000 MCG/ML IJ SOLN
1000.0000 ug | Freq: Once | INTRAMUSCULAR | Status: AC
  Administered 2012-07-01: 1000 ug via INTRAMUSCULAR

## 2012-07-01 MED ORDER — CYANOCOBALAMIN 1000 MCG/ML IJ SOLN: 1000.0000 ug | Freq: Once | INTRAMUSCULAR | Status: DC

## 2012-07-01 MED ORDER — ZOSTER VACCINE LIVE 19400 UNT/0.65ML ~~LOC~~ SOLR: 0.6500 mL | Freq: Once | SUBCUTANEOUS | Status: DC

## 2012-07-01 MED ORDER — NITROGLYCERIN 0.4 MG SL SUBL: 0.4000 mg | SUBLINGUAL_TABLET | SUBLINGUAL | Status: DC | PRN

## 2012-07-01 MED ORDER — MOMETASONE FUROATE 50 MCG/ACT NA SUSP: 2.0000 | Freq: Every day | NASAL | Status: DC

## 2012-07-01 NOTE — Assessment & Plan Note (Signed)
BP Readings from Last 3 Encounters:  07/01/12 135/65  12/11/11 138/66  08/27/11 135/74    Lab Results  Component Value Date   NA 139 12/11/2011   K 4.5 12/11/2011   CREATININE 1.16* 12/11/2011    Assessment: Blood pressure control: controlled Progress toward BP goal:  at goal Comments: Last 3 BP well controlled   Plan: Medications:  continue current medications Educational resources provided: handout Self management tools provided: other (see comments) (none needed) Other plans: Cont current meds as is. Will need BMP next visit.

## 2012-07-01 NOTE — Patient Instructions (Addendum)
General Instructions: 1. Check your sugar before and after all meals and before dinner for 3 days. Call [name] with the levels 2. I gave you a prescription for the Shingles vaccine. Take it to pharmacies (or call) to see if your insurance covers it. 3. I gave you a handwritten prescription for glucose testing supplies / meter. 4. I will call you about the colonoscopy.   Treatment Goals:  Goals (1 Years of Data) as of 07/01/12         As of Today 12/11/11 08/27/11 06/12/11 05/08/11     Blood Pressure    . Blood Pressure < 140/90  135/65 138/66 135/74 136/75      Result Component    . HEMOGLOBIN A1C < 7.0  8.2 8.4   8.4    . LDL CALC < 100      31      Progress Toward Treatment Goals:  Treatment Goal 07/01/2012  Hemoglobin A1C unchanged  Blood pressure at goal    Self Care Goals & Plans:  Self Care Goal 07/01/2012  Manage my medications take my medicines as prescribed  Monitor my health keep track of my blood glucose  Eat healthy foods eat baked foods instead of fried foods; eat more vegetables  Be physically active take a walk every day  Meeting treatment goals maintain the current self-care plan    Home Blood Glucose Monitoring 07/01/2012  Check my blood sugar 2 times a day  When to check my blood sugar before breakfast; after dinner     Care Management & Community Referrals:  Referral 07/01/2012  Referrals made for care management support diabetes educator

## 2012-07-01 NOTE — Assessment & Plan Note (Addendum)
Lab Results  Component Value Date   HGBA1C 8.2 07/01/2012   HGBA1C 8.4 12/11/2011   HGBA1C 8.4 05/08/2011     Assessment: Diabetes control: fair control Progress toward A1C goal:  unchanged Comments:   Plan: Medications:  continue current medications Home glucose monitoring: Frequency: 2 times a day Timing: before breakfast;after dinner Instruction/counseling given: other instruction/counseling: See below. Educational resources provided: handout Self management tools provided: home glucose meter Other plans: A1C remains elevated. She is on 34 units of Lantus and 2 units Novolog before lunch. She is also on glucotrol - not my favorite combo so if increase insulin, will D/C glucotrol. Her basal control needs would be about 46 units. However, it might be best to increase prandial first but will need to wait and see what CBG's are. I have sent message to [name] to check in with her and to expect a report of her CBG's.   I checked lipids today - great last time. Sch her for optho exam.

## 2012-07-01 NOTE — Assessment & Plan Note (Signed)
I gave her Rx for zostavax and explained medicare may not cover it and she could check at pharmacies.

## 2012-07-01 NOTE — Progress Notes (Signed)
  Subjective:    Patient ID: [name], female    DOB: 04-09-1945, 67 y.o.   MRN: [id number]  HPI  Please see the A&P for the status of the pt's chronic medical problems.  Review of Systems    [name] was without complaints today except for itchy eyes and runny nose not controlled with claritin.  Objective:   Physical Exam  Constitutional: She is oriented to person, place, and time. She appears well-developed and well-nourished. No distress.  HENT:  Head: Normocephalic and atraumatic.  Right Ear: External ear normal.  Left Ear: External ear normal.  Nose: Nose normal.  Eyes: Conjunctivae and EOM are normal.  Neck: Normal range of motion. No thyromegaly present.  Cardiovascular: Normal rate, regular rhythm and normal heart sounds.   No murmur heard. Pulmonary/Chest: Effort normal and breath sounds normal.  Musculoskeletal: She exhibits no edema.  Neurological: She is alert and oriented to person, place, and time.  Skin: Skin is warm and dry. She is not diaphoretic.  Psychiatric: She has a normal mood and affect. Her behavior is normal. Judgment and thought content normal.          Assessment & Plan:

## 2012-07-01 NOTE — Assessment & Plan Note (Signed)
Had not had a B12 shot since Sep. Most recent level was 524 4 yrs ago. Since not getting them on a regular basis, I went ahead and ordered a level. This was done just minutes after her B12 injection so shouldn't affect the result.

## 2012-07-01 NOTE — Assessment & Plan Note (Signed)
She has no CP nor SOB / DOE. Her cont to see Dr Johnsie Cancel. Her NTG is expired so I sent in refill to pharmacy.

## 2012-07-01 NOTE — Assessment & Plan Note (Signed)
No more abd pain.

## 2012-07-01 NOTE — Assessment & Plan Note (Signed)
Rechecking level today.

## 2012-07-01 NOTE — Assessment & Plan Note (Signed)
Claritin is not controlling her sxs. They are typically present only in spring. Tried Zyrtec and she wasn't able to sleep. I will try generic nasonex. Explained side effects.

## 2012-07-02 ENCOUNTER — Other Ambulatory Visit: Payer: Self-pay | Admitting: *Deleted

## 2012-07-02 DIAGNOSIS — E785 Hyperlipidemia, unspecified: Secondary | ICD-10-CM

## 2012-07-02 LAB — LDL CHOLESTEROL, DIRECT: Direct LDL: 55 mg/dL

## 2012-07-02 MED ORDER — ATORVASTATIN CALCIUM 20 MG PO TABS: 20.0000 mg | ORAL_TABLET | Freq: Every day | ORAL | Status: DC

## 2012-07-02 NOTE — Addendum Note (Signed)
Addended by: Truddie Crumble on: 07/02/2012 09:23 AM   Modules accepted: Orders

## 2012-07-08 NOTE — Telephone Encounter (Signed)
Rx faxed in.

## 2012-07-10 ENCOUNTER — Telehealth: Payer: Self-pay | Admitting: Dietician

## 2012-07-10 NOTE — Telephone Encounter (Signed)
Unable to leave message

## 2012-07-15 ENCOUNTER — Other Ambulatory Visit: Payer: Self-pay | Admitting: *Deleted

## 2012-07-15 DIAGNOSIS — E119 Type 2 diabetes mellitus without complications: Secondary | ICD-10-CM

## 2012-07-16 NOTE — Telephone Encounter (Signed)
Patient was driving and could not talk. She asked for CDE to call her tomorrow at 3 PM.

## 2012-07-17 NOTE — Telephone Encounter (Signed)
Patient called with the following blood sugars:  Sunday  613am-107 445pm - 81  Monday  532am-117 434pm-145 Tuesday  816am-159 406pm-184 Thursday 600am-134  Offered to pass nay messages along to Dr. Lynnae January- patient says her sinuses are bothering her, but she'll try to deal with it.

## 2012-07-18 NOTE — Telephone Encounter (Signed)
What would you say to stopping glucotrol and increasing prandial from 2units before lunch to 2 units before 2 largest meals? Her AM (3 of 4) seem OK and 4 PM all over the place.A1C 8.2. Are you OK with that plan or have a better suggestion? Would you be wiling to communicate with her - I will be out this afternoon. Thanks!

## 2012-07-18 NOTE — Telephone Encounter (Signed)
Left message for patient about new order. Requested return call.

## 2012-07-18 NOTE — Telephone Encounter (Signed)
Patient returned call and verbalized understanding to new order. She plans to take 2 units of Novolog before lunch and dinner: her 2 largest meals

## 2012-07-23 ENCOUNTER — Other Ambulatory Visit: Payer: Self-pay | Admitting: Internal Medicine

## 2012-07-23 DIAGNOSIS — E119 Type 2 diabetes mellitus without complications: Secondary | ICD-10-CM

## 2012-07-23 MED ORDER — INSULIN ASPART 100 UNIT/ML ~~LOC~~ SOLN: SUBCUTANEOUS | Status: DC

## 2012-07-23 NOTE — Telephone Encounter (Signed)
Thanks you. I have entered new insulin dose in EPIC med list.

## 2012-07-25 NOTE — Telephone Encounter (Signed)
Call  Taken care of 07/23/12.

## 2012-08-01 ENCOUNTER — Encounter: Payer: Self-pay | Admitting: Internal Medicine

## 2012-08-22 ENCOUNTER — Other Ambulatory Visit: Payer: Self-pay | Admitting: Internal Medicine

## 2012-09-02 ENCOUNTER — Other Ambulatory Visit: Payer: Self-pay | Admitting: *Deleted

## 2012-09-02 MED ORDER — GLUCOSE BLOOD VI STRP: 1.0000 | ORAL_STRIP | Freq: Two times a day (BID) | Status: DC

## 2012-09-02 NOTE — Telephone Encounter (Signed)
Just refilled but needs Dx code added.

## 2012-09-04 ENCOUNTER — Encounter: Payer: Self-pay | Admitting: Cardiovascular Disease

## 2012-09-04 ENCOUNTER — Ambulatory Visit (INDEPENDENT_AMBULATORY_CARE_PROVIDER_SITE_OTHER): Payer: Medicare Other | Admitting: Cardiovascular Disease

## 2012-09-04 VITALS — BP 124/72 | HR 66 | Ht 64.0 in | Wt 199.0 lb

## 2012-09-04 DIAGNOSIS — E785 Hyperlipidemia, unspecified: Secondary | ICD-10-CM

## 2012-09-04 DIAGNOSIS — I10 Essential (primary) hypertension: Secondary | ICD-10-CM

## 2012-09-04 DIAGNOSIS — I2510 Atherosclerotic heart disease of native coronary artery without angina pectoris: Secondary | ICD-10-CM

## 2012-09-04 NOTE — Assessment & Plan Note (Signed)
Well controlled.  Continue current medications and low sodium Dash type diet.    

## 2012-09-04 NOTE — Assessment & Plan Note (Signed)
Stable with no angina and good activity level.  Continue medical Rx  

## 2012-09-04 NOTE — Assessment & Plan Note (Signed)
Cholesterol is at goal.  Continue current dose of statin and diet Rx.  No myalgias or side effects.  F/U  LFT's in 6 months. Lab Results  Component Value Date   LDLCALC Comment:   Not calculated due to Triglyceride >400. Suggest ordering Direct LDL (Unit Code: [phone]).   Total Cholesterol/HDL Ratio:CHD Risk                        Coronary Heart Disease Risk Table                                        Men       Women          1/2 Average Risk              3.4        3.3              Average Risk              5.0        4.4           2X Average Risk              9.6        7.1           3X Average Risk             23.4       11.0 Use the calculated Patient Ratio above and the CHD Risk table  to determine the patient's CHD Risk. ATP III Classification (LDL):       < 100        mg/dL         Optimal      100 - 129     mg/dL         Near or Above Optimal      130 - 159     mg/dL         Borderline High      160 - 189     mg/dL         High       > 190        mg/dL         Very High   07/01/2012

## 2012-09-04 NOTE — Progress Notes (Signed)
Patient ID: [name], female   DOB: [birth year], 68 y.o.   MRN: [id number] [name] is seen today for F/U of CAD with pevious rotoblator to the LAD in 2000. Her last myovue on 11/2006 showed mild apical thinning and was low risk. She has not had any SSCP and has been compliant with her meds.Her BP is alwyas hard to Rx and she is on multiple drugs Previous tachycardia now improved . She denies dyspnea, PND orthopnea. Has some swelling in her feet. Diet still an issue with her DM. Had a nice trip to the [place] with her daughter last month   Discussed Accelerate Trial with her and she declined. ON lipitor with HDL 39 and LDL 31  Still working at day care  ROS: Denies fever, malais, weight loss, blurry vision, decreased visual acuity, cough, sputum, SOB, hemoptysis, pleuritic pain, palpitaitons, heartburn, abdominal pain, melena, lower extremity edema, claudication, or rash.  All other systems reviewed and negative  General: Affect appropriate Overweight black female HEENT: normal Neck supple with no adenopathy JVP normal no bruits no thyromegaly Lungs clear with no wheezing and good diaphragmatic motion Heart:  S1/S2 no murmur, no rub, gallop or click PMI normal Abdomen: benighn, BS positve, no tenderness, no AAA no bruit.  No HSM or HJR Distal pulses intact with no bruits No edema Neuro non-focal Skin warm and dry No muscular weakness   Current Outpatient Prescriptions  Medication Sig Dispense Refill  . 1st Choice Lancets Thin MISC by Does not apply route 2 (two) times daily.        Marland Kitchen amLODipine (NORVASC) 10 MG tablet Take 1 tablet (10 mg total) by mouth daily.  90 tablet  3  . aspirin 81 MG tablet Take 81 mg by mouth daily.        Marland Kitchen atenolol (TENORMIN) 50 MG tablet TAKE ONE TABLET BY MOUTH TWICE DAILY  60 tablet  5  . atorvastatin (LIPITOR) 20 MG tablet Take 1 tablet (20 mg total) by mouth at bedtime.  90 tablet  3  . clopidogrel (PLAVIX) 75 MG tablet Take 1 tablet (75 mg total) by  mouth daily.  90 tablet  3  . colesevelam (WELCHOL) 625 MG tablet Take 3 tablets (1,875 mg total) by mouth daily.  540 tablet  5  . cyanocobalamin (,VITAMIN B-12,) 1000 MCG/ML injection Inject 1 mL (1,000 mcg total) into the muscle once.  1 mL  0  . ferrous sulfate 325 (65 FE) MG tablet Take 325 mg by mouth 3 (three) times daily with meals.        . furosemide (LASIX) 20 MG tablet TAKE ONE TABLET BY MOUTH TWICE DAILY  60 tablet  2  . glipiZIDE (GLUCOTROL) 10 MG tablet Take 2 tablets (20 mg total) by mouth 2 (two) times daily before a meal.  400 tablet  3  . glucose blood (ONE TOUCH ULTRA TEST) test strip 1 each by Other route 2 (two) times daily. icd 9 250.00  100 each  11  . hydrOXYzine (VISTARIL) 50 MG capsule Take 1 capsule (50 mg total) by mouth at bedtime as needed.  90 capsule  3  . insulin aspart (NOVOLOG) 100 UNIT/ML injection Inject 2 units before lunch and dinner every day.  10 mL  12  . insulin glargine (LANTUS) 100 UNIT/ML injection Inject 34 Units into the skin at bedtime.  40 mL  1  . Insulin Syringe-Needle U-100 (INSULIN SYRINGE 1CC/28G) 28G X 1/2" 1 ML MISC by Does not apply  route.        . mometasone (NASONEX) 50 MCG/ACT nasal spray Place 2 sprays into the nose daily.  17 g  5  . nitroGLYCERIN (NITROSTAT) 0.4 MG SL tablet Place 1 tablet (0.4 mg total) under the tongue every 5 (five) minutes as needed.  90 tablet  3  . quinapril (ACCUPRIL) 10 MG tablet Take 1 tablet (10 mg total) by mouth daily.  90 tablet  3  . zoster vaccine live, PF, (ZOSTAVAX) 04888 UNT/0.65ML injection Inject 19,400 Units into the skin once.  1 each  0  . potassium chloride (K-DUR) 10 MEQ tablet Take 2 tablets (20 mEq total) by mouth daily.  60 tablet  11   Current Facility-Administered Medications  Medication Dose Route Frequency Provider Last Rate Last Dose  . cyanocobalamin ((VITAMIN B-12)) injection 1,000 mcg  1,000 mcg Intramuscular Once Bartholomew Crews, MD        Allergies  Diazepam; Haloperidol  lactate; Lorazepam; Morphine sulfate; Propoxyphene hcl; and Tramadol hcl  Electrocardiogram:  SR rate 66 LAD RBBB   Assessment and Plan

## 2012-09-04 NOTE — Patient Instructions (Signed)
Your physician wants you to follow-up in: YEAR WITH DR [name]  You will receive a reminder letter in the mail two months in advance. If you don't receive a letter, please call our office to schedule the follow-up appointment.  Your physician recommends that you continue on your current medications as directed. Please refer to the Current Medication list given to you today. 

## 2012-09-06 ENCOUNTER — Other Ambulatory Visit: Payer: Self-pay | Admitting: Internal Medicine

## 2012-09-22 ENCOUNTER — Telehealth: Payer: Self-pay | Admitting: *Deleted

## 2012-09-22 MED ORDER — CYANOCOBALAMIN 1000 MCG/ML IJ SOLN: 1000.0000 ug | INTRAMUSCULAR | Status: DC

## 2012-09-22 NOTE — Telephone Encounter (Signed)
Spoke with pt would like to see if her Vitamin B 12 can be sent to her pharmacy.  Pt has a daughter and granddaughter who are nurses and can give her the injections.  Visits to Clinic are becoming costly.  Sander Nephew, RN 09/22/2012 8:26 AM

## 2012-09-22 NOTE — Telephone Encounter (Signed)
Sent to pharmacy 

## 2012-09-25 ENCOUNTER — Other Ambulatory Visit: Payer: Self-pay

## 2012-09-30 ENCOUNTER — Telehealth: Payer: Self-pay | Admitting: *Deleted

## 2012-09-30 NOTE — Telephone Encounter (Signed)
Spoke with patient her son was found dead on 05/20/2022.  Would like to get something for her nerves if possible.  Medication can be sent to the [facility] on [address] if possible.  Sander Nephew, RN 09/30/2012 8:59 AM

## 2012-09-30 NOTE — Telephone Encounter (Signed)
Pt also request Clarinex 5 mg tabs to be sent to [facility]

## 2012-10-01 MED ORDER — DESLORATADINE 5 MG PO TABS: 5.0000 mg | ORAL_TABLET | Freq: Every day | ORAL | Status: DC

## 2012-10-01 MED ORDER — ZOLPIDEM TARTRATE 5 MG PO TABS: 5.0000 mg | ORAL_TABLET | Freq: Every evening | ORAL | Status: DC | PRN

## 2012-10-01 NOTE — Telephone Encounter (Signed)
Having trouble doing what she need to during the day. Trouble sleeping. Tearful and in greaving. No SI. The hydroxyzine is not helping. Reactions to two benzos. Will Rx Ambien to help her sleep - to be used PRN only. Not to combine with hydroxyzine. Take on empty stomach. Take and go to bed - caution with walking.

## 2012-11-10 ENCOUNTER — Other Ambulatory Visit: Payer: Self-pay | Admitting: Internal Medicine

## 2012-11-10 NOTE — Telephone Encounter (Signed)
Pls sch Oct aptp with me. Nov or Dec OK with me if Oct not open.

## 2013-01-06 ENCOUNTER — Ambulatory Visit (INDEPENDENT_AMBULATORY_CARE_PROVIDER_SITE_OTHER): Payer: Medicare Other | Admitting: Internal Medicine

## 2013-01-06 ENCOUNTER — Encounter: Payer: Self-pay | Admitting: Internal Medicine

## 2013-01-06 VITALS — BP 139/70 | HR 71 | Temp 98.0°F | Ht 64.0 in | Wt 198.5 lb

## 2013-01-06 DIAGNOSIS — J309 Allergic rhinitis, unspecified: Secondary | ICD-10-CM

## 2013-01-06 DIAGNOSIS — E119 Type 2 diabetes mellitus without complications: Secondary | ICD-10-CM

## 2013-01-06 DIAGNOSIS — I2510 Atherosclerotic heart disease of native coronary artery without angina pectoris: Secondary | ICD-10-CM

## 2013-01-06 DIAGNOSIS — I10 Essential (primary) hypertension: Secondary | ICD-10-CM

## 2013-01-06 DIAGNOSIS — Z23 Encounter for immunization: Secondary | ICD-10-CM

## 2013-01-06 DIAGNOSIS — I872 Venous insufficiency (chronic) (peripheral): Secondary | ICD-10-CM

## 2013-01-06 DIAGNOSIS — E1129 Type 2 diabetes mellitus with other diabetic kidney complication: Secondary | ICD-10-CM

## 2013-01-06 DIAGNOSIS — J302 Other seasonal allergic rhinitis: Secondary | ICD-10-CM

## 2013-01-06 DIAGNOSIS — E785 Hyperlipidemia, unspecified: Secondary | ICD-10-CM

## 2013-01-06 DIAGNOSIS — D649 Anemia, unspecified: Secondary | ICD-10-CM

## 2013-01-06 MED ORDER — AMLODIPINE BESYLATE 10 MG PO TABS: 10.0000 mg | ORAL_TABLET | Freq: Every day | ORAL | Status: DC

## 2013-01-06 MED ORDER — MOMETASONE FUROATE 50 MCG/ACT NA SUSP: 2.0000 | Freq: Every day | NASAL | Status: DC

## 2013-01-06 MED ORDER — INSULIN GLARGINE 100 UNIT/ML ~~LOC~~ SOLN: 30.0000 [IU] | Freq: Every day | SUBCUTANEOUS | Status: DC

## 2013-01-06 MED ORDER — COLESEVELAM HCL 625 MG PO TABS: 1875.0000 mg | ORAL_TABLET | Freq: Every day | ORAL | Status: DC

## 2013-01-06 MED ORDER — INSULIN ASPART 100 UNIT/ML ~~LOC~~ SOLN: SUBCUTANEOUS | Status: DC

## 2013-01-06 MED ORDER — CYANOCOBALAMIN 1000 MCG/ML IJ SOLN: 1000.0000 ug | INTRAMUSCULAR | Status: DC

## 2013-01-06 MED ORDER — ASPIRIN 81 MG PO TABS: 81.0000 mg | ORAL_TABLET | Freq: Every day | ORAL | Status: DC

## 2013-01-06 MED ORDER — HYDROXYZINE PAMOATE 50 MG PO CAPS: 50.0000 mg | ORAL_CAPSULE | Freq: Every evening | ORAL | Status: DC | PRN

## 2013-01-06 MED ORDER — FERROUS SULFATE 325 (65 FE) MG PO TABS: 325.0000 mg | ORAL_TABLET | Freq: Three times a day (TID) | ORAL | Status: DC

## 2013-01-06 MED ORDER — ATORVASTATIN CALCIUM 20 MG PO TABS: 20.0000 mg | ORAL_TABLET | Freq: Every day | ORAL | Status: DC

## 2013-01-06 MED ORDER — ATENOLOL 50 MG PO TABS: ORAL_TABLET | ORAL | Status: DC

## 2013-01-06 MED ORDER — LISINOPRIL 10 MG PO TABS: 10.0000 mg | ORAL_TABLET | Freq: Every day | ORAL | Status: DC

## 2013-01-06 MED ORDER — NITROGLYCERIN 0.4 MG SL SUBL: 0.4000 mg | SUBLINGUAL_TABLET | SUBLINGUAL | Status: DC | PRN

## 2013-01-06 MED ORDER — ZOLPIDEM TARTRATE 5 MG PO TABS: 5.0000 mg | ORAL_TABLET | Freq: Every evening | ORAL | Status: DC | PRN

## 2013-01-06 MED ORDER — CYANOCOBALAMIN 1000 MCG/ML IJ SOLN
1000.0000 ug | Freq: Once | INTRAMUSCULAR | Status: AC
  Administered 2013-01-06: 1000 ug via INTRAMUSCULAR

## 2013-01-06 MED ORDER — FUROSEMIDE 20 MG PO TABS: ORAL_TABLET | ORAL | Status: DC

## 2013-01-06 NOTE — Progress Notes (Signed)
  Subjective:    Patient ID: [name], female    DOB: 10-26-1945, 67 y.o.   MRN: [id number]  Diabetes    Please see the A&P for the status of the pt's chronic medical problems.   Review of Systems  Constitutional: Negative for unexpected weight change.  HENT: Negative for rhinorrhea and sneezing.   Eyes: Negative for itching.  Respiratory: Negative for shortness of breath.   Cardiovascular: Positive for leg swelling.  Gastrointestinal: Negative for diarrhea.  Endocrine:       + hypoglycemic sxs  Musculoskeletal: Negative for gait problem.       No calf tenderness  Skin: Negative for wound.  Neurological: Positive for light-headedness.  Psychiatric/Behavioral: Positive for sleep disturbance.       Objective:   Physical Exam  Constitutional: She is oriented to person, place, and time. She appears well-developed and well-nourished. No distress.  HENT:  Head: Normocephalic and atraumatic.  Right Ear: External ear normal.  Left Ear: External ear normal.  Nose: Nose normal.  Eyes: Conjunctivae and EOM are normal. Right eye exhibits no discharge. Left eye exhibits no discharge. No scleral icterus.  Neck: Normal range of motion. Neck supple. No thyromegaly present.  Cardiovascular: Normal rate, regular rhythm and normal heart sounds.   Pulmonary/Chest: Effort normal.  Musculoskeletal: Normal range of motion. She exhibits edema. She exhibits no tenderness.  Lymphadenopathy:    She has no cervical adenopathy.  Neurological: She is alert and oriented to person, place, and time.  Skin: Skin is warm and dry. She is not diaphoretic.  Psychiatric: She has a normal mood and affect. Her behavior is normal. Judgment and thought content normal.          Assessment & Plan:

## 2013-01-06 NOTE — Assessment & Plan Note (Signed)
The only statin on the $4 list is lovastatin 20 which is not comparable to her atorvastatin 20. I will leave statin as is and see what her co pay is. Other than her trig (which likely were not fasting) her FLP is well controlled so might be able to handle reduction in her statin potency.

## 2013-01-06 NOTE — Assessment & Plan Note (Addendum)
She c/o a few months of B but L>R LE edema that is worse at the end of the day. She has no personal or fam h/o VTE. She has had no immobilization. She has no calf tenderness. Both LE are about trace to +1 pitting edema B and symmetric. She has just slight increased pigmentation at ankles B. We reviewed the dx, the risk of cellulitis, the chronicity, and the tx (raise legs, stockings, etc).

## 2013-01-06 NOTE — Assessment & Plan Note (Signed)
Lab Results  Component Value Date   HGBA1C 6.9 01/06/2013   She is on Lantus 34 units and 2 units Novolog with lunch and dinner. Her A1C has decreased from 8.2 to 6.9 today. She has bade great changes to her eating - more veggies, less carbs. Unfortunately, she is having symptomatic hypoglycemia in the AM and is documented in her CBG log. Several are in the high 60's. This only occurs in the AM not in the afternoon or evening after her novolog. She checks her CBG once daily in AM. Therefore, will reduce her lantus from 34 to 30 units in AM and leave her Novolog as is. She had already stopped her Glucotrol. [name] will call her in one week to see if she is still hypoglycemic.  Other DM indicators are UTD. Foot exam today.

## 2013-01-06 NOTE — Patient Instructions (Addendum)
1. I will work on changing your medicines to [place] today and changing to generic if possible.  2. I will send you a letter with the medicine changes 3. Decrease Lantus to 30 units. 4. [name] will call you net week - track your sugars and when you feel bad 5. See me in 3 -6 months 6. The fluid in your legs is Chronic Venous Insufficiency - annoying but not life threatening. If legs become red, painful, please call us 7. Come back in one month for B 12 shot

## 2013-01-06 NOTE — Assessment & Plan Note (Signed)
BP well controlled on her Norvasc 10, atenolol 50, lasix 20, and accupril 10. I will need to change her Accupril to lisinopril as she wants to change her meds to [facility] and will need $4 meds. I looked on line and one course started her 10 accupril was equivalent to 10 lisinopril, another started 5 lisinopril. Will start 10 mg.

## 2013-01-06 NOTE — Assessment & Plan Note (Signed)
The nasonex did very well and she is now asymptomatic. Cont

## 2013-01-06 NOTE — Assessment & Plan Note (Signed)
She remains asymptomatic and on her ASA, plavix, BB, & NTG prn. She sees Dr Johnsie Cancel.

## 2013-01-07 ENCOUNTER — Telehealth: Payer: Self-pay | Admitting: *Deleted

## 2013-01-07 DIAGNOSIS — E785 Hyperlipidemia, unspecified: Secondary | ICD-10-CM

## 2013-01-07 NOTE — Telephone Encounter (Signed)
____ Had hypoglycemia on SU/Glinide  ____ Had hypoglycemia on insulin-stopping insulin  ____ HIGH risk if patient gets hypoglycemia          ____ drives a lot          ____ walks up and down stairs         ____ fall risk-morbid obesity, ankle or knee problems, hip problem,  DM neuropathy        ____ uses gas/electric ranges  ____ ASCVD, arrhythmia, angina, S/P MI  ____ TIA, CVA,  Carotid bruit  ____ watches little children  ____ uses electric tools ( including blow dryer, curling iron)   ____uses guns or other dangerous professions    Attach a copy of this to the prescription or insert in comments  If true, check off first 2 boxes (stopping insulin kept if not approved for insulin combination, but crossed out if medicine can be given with insulin)   CHECK OFF THIRD BOX FOR EVERYONE, with appropriate checks in other boxes to support contention.

## 2013-01-16 ENCOUNTER — Telehealth: Payer: Self-pay | Admitting: *Deleted

## 2013-01-16 NOTE — Telephone Encounter (Signed)
Vistaril and Welchol rxs , written 10/21, called to [facility] on [address]; pt called and informed.

## 2013-01-20 ENCOUNTER — Telehealth: Payer: Self-pay | Admitting: Dietician

## 2013-01-20 NOTE — Telephone Encounter (Signed)
Called to follow up on hypoglycemia per request of Dr. Lynnae January: patient confirms no problems with hypoglycemia on 30 units lantus daily ( decreased dose) She reports her fasting blood sugars are 100-120. Encouraged her to call us if she every has any blood sugars less than 70 mg/dl.

## 2013-01-20 NOTE — Telephone Encounter (Signed)
Thanks

## 2013-01-23 ENCOUNTER — Other Ambulatory Visit: Payer: Self-pay | Admitting: Internal Medicine

## 2013-01-23 DIAGNOSIS — Z1231 Encounter for screening mammogram for malignant neoplasm of breast: Secondary | ICD-10-CM

## 2013-01-26 ENCOUNTER — Encounter: Payer: Self-pay | Admitting: *Deleted

## 2013-01-26 MED ORDER — FENOFIBRATE 48 MG PO TABS: 48.0000 mg | ORAL_TABLET | Freq: Every day | ORAL | Status: DC

## 2013-01-26 NOTE — Telephone Encounter (Signed)
Call from pt asking if Welchol can be changed to another type of medication.  Unable to afford with present insurance.  Is almost out of the [place].  Sander Nephew, RN 01/26/2013 1:30 PM

## 2013-01-26 NOTE — Telephone Encounter (Signed)
I D/c'd the Welchol. Sent in Fenofibrate to pharmacy. I do not see where she had every tried this med before.

## 2013-02-05 ENCOUNTER — Telehealth: Payer: Self-pay | Admitting: *Deleted

## 2013-02-05 ENCOUNTER — Telehealth: Payer: Self-pay | Admitting: Cardiovascular Disease

## 2013-02-05 NOTE — Telephone Encounter (Signed)
PT  [facility]  APP.FILLED OUT  AND LEFT AT FRONT  DESK FOR PICK UP./CY

## 2013-02-05 NOTE — Telephone Encounter (Signed)
Walk In pt Form "Disability Pl-Card" Dropped off gave to [facility] 02/05/13/KM

## 2013-02-05 NOTE — Telephone Encounter (Signed)
Spoke with [name] (per [name]) concerning her medication / statin/ . She did say she was able to get the medication for 40.00 the would be covered .  [name] NTII 11-20-014  3:51pm

## 2013-02-06 ENCOUNTER — Ambulatory Visit (HOSPITAL_COMMUNITY)
Admission: RE | Admit: 2013-02-06 | Discharge: 2013-02-06 | Disposition: A | Discharge status: Home / Self Care | Payer: Medicare Other | Source: Ambulatory Visit | Attending: Internal Medicine | Admitting: Internal Medicine

## 2013-02-06 DIAGNOSIS — Z1231 Encounter for screening mammogram for malignant neoplasm of breast: Secondary | ICD-10-CM | POA: Insufficient documentation

## 2013-02-11 ENCOUNTER — Other Ambulatory Visit: Payer: Self-pay | Admitting: Internal Medicine

## 2013-02-11 DIAGNOSIS — R928 Other abnormal and inconclusive findings on diagnostic imaging of breast: Secondary | ICD-10-CM

## 2013-02-27 ENCOUNTER — Other Ambulatory Visit: Payer: Self-pay | Admitting: Internal Medicine

## 2013-02-27 ENCOUNTER — Ambulatory Visit
Admission: RE | Admit: 2013-02-27 | Discharge: 2013-02-27 | Disposition: A | Discharge status: Home / Self Care | Payer: Medicare Other | Source: Ambulatory Visit | Attending: Internal Medicine | Admitting: Internal Medicine

## 2013-02-27 DIAGNOSIS — R928 Other abnormal and inconclusive findings on diagnostic imaging of breast: Secondary | ICD-10-CM

## 2013-02-27 DIAGNOSIS — N63 Unspecified lump in unspecified breast: Secondary | ICD-10-CM

## 2013-03-10 ENCOUNTER — Other Ambulatory Visit: Payer: Self-pay | Admitting: Internal Medicine

## 2013-03-10 ENCOUNTER — Other Ambulatory Visit: Payer: Self-pay

## 2013-03-10 DIAGNOSIS — N63 Unspecified lump in unspecified breast: Secondary | ICD-10-CM

## 2013-03-16 ENCOUNTER — Other Ambulatory Visit: Payer: Self-pay | Admitting: Internal Medicine

## 2013-03-16 ENCOUNTER — Ambulatory Visit: Payer: Medicare Other | Admitting: Internal Medicine

## 2013-03-16 ENCOUNTER — Ambulatory Visit
Admission: RE | Admit: 2013-03-16 | Discharge: 2013-03-16 | Disposition: A | Discharge status: Home / Self Care | Payer: Medicare Other | Source: Ambulatory Visit | Attending: Internal Medicine | Admitting: Internal Medicine

## 2013-03-16 DIAGNOSIS — N63 Unspecified lump in unspecified breast: Secondary | ICD-10-CM

## 2013-03-16 DIAGNOSIS — C50919 Malignant neoplasm of unspecified site of unspecified female breast: Secondary | ICD-10-CM

## 2013-03-16 HISTORY — DX: Malignant neoplasm of unspecified site of unspecified female breast: C50.919

## 2013-03-17 ENCOUNTER — Telehealth: Payer: Self-pay | Admitting: *Deleted

## 2013-03-17 ENCOUNTER — Ambulatory Visit
Admission: RE | Admit: 2013-03-17 | Discharge: 2013-03-17 | Disposition: A | Discharge status: Home / Self Care | Payer: Medicare Other | Source: Ambulatory Visit | Attending: Internal Medicine | Admitting: Internal Medicine

## 2013-03-17 DIAGNOSIS — N63 Unspecified lump in unspecified breast: Secondary | ICD-10-CM

## 2013-03-17 DIAGNOSIS — C50911 Malignant neoplasm of unspecified site of right female breast: Secondary | ICD-10-CM

## 2013-03-17 NOTE — Telephone Encounter (Signed)
Call from Dr Rosana Hoes - pt had a right breast biopsy which is + for carcinoma and pt was informed pe MD. Thanks

## 2013-03-18 ENCOUNTER — Telehealth: Payer: Self-pay | Admitting: *Deleted

## 2013-03-18 DIAGNOSIS — C50311 Malignant neoplasm of lower-inner quadrant of right female breast: Secondary | ICD-10-CM | POA: Insufficient documentation

## 2013-03-18 NOTE — Telephone Encounter (Signed)
Confirmed [facility] for 03/25/13 at 1230 .  Instructions and contact information given.

## 2013-03-19 DIAGNOSIS — Z923 Personal history of irradiation: Secondary | ICD-10-CM

## 2013-03-19 HISTORY — PX: BREAST BIOPSY: SHX20

## 2013-03-19 HISTORY — DX: Personal history of irradiation: Z92.3

## 2013-03-20 ENCOUNTER — Ambulatory Visit: Payer: Medicare Other | Admitting: Internal Medicine

## 2013-03-25 ENCOUNTER — Ambulatory Visit
Admission: RE | Admit: 2013-03-25 | Discharge: 2013-03-25 | Disposition: A | Discharge status: Home / Self Care | Payer: Medicare Other | Source: Ambulatory Visit | Attending: Radiation Oncology | Admitting: Radiation Oncology

## 2013-03-25 ENCOUNTER — Encounter: Payer: Self-pay | Admitting: *Deleted

## 2013-03-25 ENCOUNTER — Ambulatory Visit (HOSPITAL_BASED_OUTPATIENT_CLINIC_OR_DEPARTMENT_OTHER): Payer: Medicare Other | Admitting: General Surgery

## 2013-03-25 ENCOUNTER — Other Ambulatory Visit (HOSPITAL_BASED_OUTPATIENT_CLINIC_OR_DEPARTMENT_OTHER): Payer: Medicare Other

## 2013-03-25 ENCOUNTER — Ambulatory Visit: Payer: Medicare Other

## 2013-03-25 ENCOUNTER — Encounter (INDEPENDENT_AMBULATORY_CARE_PROVIDER_SITE_OTHER): Payer: Self-pay

## 2013-03-25 ENCOUNTER — Ambulatory Visit: Payer: Medicare Other | Attending: General Surgery | Admitting: Physical Therapy

## 2013-03-25 ENCOUNTER — Ambulatory Visit (HOSPITAL_BASED_OUTPATIENT_CLINIC_OR_DEPARTMENT_OTHER): Payer: Medicare Other | Admitting: Oncology

## 2013-03-25 VITALS — BP 153/69 | HR 72 | Temp 99.0°F | Resp 18 | Ht 64.0 in | Wt 192.9 lb

## 2013-03-25 DIAGNOSIS — C50311 Malignant neoplasm of lower-inner quadrant of right female breast: Secondary | ICD-10-CM

## 2013-03-25 DIAGNOSIS — I252 Old myocardial infarction: Secondary | ICD-10-CM | POA: Insufficient documentation

## 2013-03-25 DIAGNOSIS — M25519 Pain in unspecified shoulder: Secondary | ICD-10-CM | POA: Insufficient documentation

## 2013-03-25 DIAGNOSIS — C50319 Malignant neoplasm of lower-inner quadrant of unspecified female breast: Secondary | ICD-10-CM

## 2013-03-25 DIAGNOSIS — R293 Abnormal posture: Secondary | ICD-10-CM | POA: Insufficient documentation

## 2013-03-25 DIAGNOSIS — I10 Essential (primary) hypertension: Secondary | ICD-10-CM | POA: Insufficient documentation

## 2013-03-25 DIAGNOSIS — C50919 Malignant neoplasm of unspecified site of unspecified female breast: Secondary | ICD-10-CM

## 2013-03-25 DIAGNOSIS — IMO0001 Reserved for inherently not codable concepts without codable children: Secondary | ICD-10-CM | POA: Insufficient documentation

## 2013-03-25 DIAGNOSIS — C50911 Malignant neoplasm of unspecified site of right female breast: Secondary | ICD-10-CM

## 2013-03-25 DIAGNOSIS — Z170 Estrogen receptor positive status [ER+]: Secondary | ICD-10-CM

## 2013-03-25 DIAGNOSIS — D649 Anemia, unspecified: Secondary | ICD-10-CM

## 2013-03-25 DIAGNOSIS — E119 Type 2 diabetes mellitus without complications: Secondary | ICD-10-CM | POA: Insufficient documentation

## 2013-03-25 LAB — CBC WITH DIFFERENTIAL/PLATELET
BASO%: 0.4 % (ref 0.0–2.0)
Basophils Absolute: 0 10*3/uL (ref 0.0–0.1)
EOS%: 1.4 % (ref 0.0–7.0)
Eosinophils Absolute: 0.1 10*3/uL (ref 0.0–0.5)
HCT: 27.6 % — ABNORMAL LOW (ref 34.8–46.6)
HGB: 9.1 g/dL — ABNORMAL LOW (ref 11.6–15.9)
LYMPH%: 25.3 % (ref 14.0–49.7)
MCH: 25.5 pg (ref 25.1–34.0)
MCHC: 33 g/dL (ref 31.5–36.0)
MCV: 77.1 fL — AB (ref 79.5–101.0)
MONO#: 0.6 10*3/uL (ref 0.1–0.9)
MONO%: 5.6 % (ref 0.0–14.0)
NEUT#: 6.7 10*3/uL — ABNORMAL HIGH (ref 1.5–6.5)
NEUT%: 67.3 % (ref 38.4–76.8)
PLATELETS: 369 10*3/uL (ref 145–400)
RBC: 3.58 10*6/uL — AB (ref 3.70–5.45)
RDW: 16.3 % — AB (ref 11.2–14.5)
WBC: 9.9 10*3/uL (ref 3.9–10.3)
lymph#: 2.5 10*3/uL (ref 0.9–3.3)

## 2013-03-25 LAB — COMPREHENSIVE METABOLIC PANEL (CC13)
ALK PHOS: 99 U/L (ref 40–150)
ALT: 13 U/L (ref 0–55)
AST: 13 U/L (ref 5–34)
Albumin: 3.7 g/dL (ref 3.5–5.0)
Anion Gap: 10 mEq/L (ref 3–11)
BILIRUBIN TOTAL: 0.24 mg/dL (ref 0.20–1.20)
BUN: 22.9 mg/dL (ref 7.0–26.0)
CO2: 24 mEq/L (ref 22–29)
Calcium: 9.3 mg/dL (ref 8.4–10.4)
Chloride: 108 mEq/L (ref 98–109)
Creatinine: 1.4 mg/dL — ABNORMAL HIGH (ref 0.6–1.1)
Glucose: 123 mg/dl (ref 70–140)
Potassium: 3.9 mEq/L (ref 3.5–5.1)
Sodium: 142 mEq/L (ref 136–145)
Total Protein: 8.9 g/dL — ABNORMAL HIGH (ref 6.4–8.3)

## 2013-03-25 NOTE — Progress Notes (Signed)
[facility] NEW PATIENT EVALUATION  Name: [name] MRN: [id number]  Date:   03/25/2013           DOB: 1945-09-22  Status: outpatient   CC: Larey Dresser, MD  Stark Klein, MD    REFERRING PHYSICIAN: Stark Klein, MD   DIAGNOSIS: Stage I (T1, N0, M0) invasive ductal carcinoma the right breast   HISTORY OF PRESENT ILLNESS:  [name] is a 68 y.o. female who is seen today through the courtesy of Dr. [name] at the [facility]. for evaluation of her T1, N0, M0 invasive ductal carcinoma of the right breast. At the time of a screening mammogram on 02/06/2013 she was felt to have a possible mass within the right breast. Followup mammography and ultrasound on 02/27/2013 showed a 6 x 5 mm mass at 5:00. There are no axillary lymph nodes. She did not have a MRI scan. She underwent a biopsy on 03/16/2013 this was diagnostic for invasive ductal carcinoma, felt to be low-grade and measured 0.5 cm from the glass slide. The tumor was ER/PR positive and HER-2/neu negative. Ki-67 was 20%.  PREVIOUS RADIATION THERAPY: No   PAST MEDICAL HISTORY:  has a past medical history of Hypertension; CAD (coronary artery disease) (1996); Abdominal discomfort; Anemia; Stroke; Diabetes mellitus; Allergy; Chronic pain; Chronic kidney disease; Adnexal mass (8/03); and Hyperlipidemia.     PAST SURGICAL HISTORY:  Past Surgical History  Procedure Laterality Date  . Ptca  4/06  . Bowel resection  2003    ileocolectomy with anastomosis 2/2 adhesions  . Cholecystectomy    . Hernia repair      Ventral hernia repair  . Bilateral salpingoophorectomy  8/03    Lap BSO (R ovarian fibroma) and adhesion lysis  . Hemicolectomy      R sided hemicolectomy     FAMILY HISTORY: family history includes Breast cancer in her mother; Cancer in her mother; Cancer (age of onset: 50) in her brother; Hypertension in her daughter. Her father's health is unknown. Her mother died from breast  cancer at age 57.   SOCIAL HISTORY:  reports that she has never smoked. She has never used smokeless tobacco. She reports that she does not drink alcohol or use illicit drugs. Separated, 45 children living. 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   ALLERGIES: Diazepam; Haloperidol lactate; Lorazepam; Morphine sulfate; Propoxyphene hcl; and Tramadol hcl   MEDICATIONS:  Current Outpatient Prescriptions  Medication Sig Dispense Refill  . 1st Choice Lancets Thin MISC by Does not apply route 2 (two) times daily.        Marland Kitchen amLODipine (NORVASC) 10 MG tablet Take 1 tablet (10 mg total) by mouth daily.  90 tablet  3  . aspirin 81 MG tablet Take 1 tablet (81 mg total) by mouth daily.  90 tablet  3  . atenolol (TENORMIN) 50 MG tablet TAKE ONE TABLET BY MOUTH TWICE DAILY  180 tablet  3  . atorvastatin (LIPITOR) 20 MG tablet Take 1 tablet (20 mg total) by mouth at bedtime.  90 tablet  3  . clopidogrel (PLAVIX) 75 MG tablet TAKE ONE (1) TABLET EACH DAY  90 tablet  3  . cyanocobalamin (,VITAMIN B-12,) 1000 MCG/ML injection Inject 1 mL (1,000 mcg total) into the muscle every 30 (thirty) days.  10 mL  1  . fenofibrate (TRICOR) 48 MG tablet Take 1 tablet (48 mg total) by mouth daily.  30 tablet  11  . ferrous  sulfate 325 (65 FE) MG tablet Take 1 tablet (325 mg total) by mouth 3 (three) times daily with meals.  270 tablet  3  . furosemide (LASIX) 20 MG tablet TAKE ONE TABLET BY MOUTH TWICE DAILY  180 tablet  3  . glucose blood (ONE TOUCH ULTRA TEST) test strip 1 each by Other route 2 (two) times daily. icd 9 250.00  100 each  11  . hydrOXYzine (VISTARIL) 50 MG capsule Take 1 capsule (50 mg total) by mouth at bedtime as needed.  90 capsule  3  . insulin aspart (NOVOLOG) 100 UNIT/ML injection Inject 2 units before lunch and dinner every day.  20 mL  3  . insulin glargine (LANTUS) 100 UNIT/ML injection Inject 0.3 mLs (30 Units total) into the skin at bedtime.  30 mL  3  . Insulin Syringe-Needle U-100  (INSULIN SYRINGE 1CC/28G) 28G X 1/2" 1 ML MISC by Does not apply route.        Marland Kitchen lisinopril (PRINIVIL,ZESTRIL) 10 MG tablet Take 1 tablet (10 mg total) by mouth daily.  90 tablet  3  . mometasone (NASONEX) 50 MCG/ACT nasal spray Place 2 sprays into the nose daily.  51 g  3  . nitroGLYCERIN (NITROSTAT) 0.4 MG SL tablet Place 1 tablet (0.4 mg total) under the tongue every 5 (five) minutes as needed.  90 tablet  3  . potassium chloride (K-DUR) 10 MEQ tablet Take 2 tablets (20 mEq total) by mouth daily.  60 tablet  11  . zolpidem (AMBIEN) 5 MG tablet Take 1 tablet (5 mg total) by mouth at bedtime as needed for sleep.  30 tablet  0  . zoster vaccine live, PF, (ZOSTAVAX) 70962 UNT/0.65ML injection Inject 19,400 Units into the skin once.  1 each  0   No current facility-administered medications for this encounter.     REVIEW OF SYSTEMS:  Pertinent items are noted in HPI.    PHYSICAL EXAM: Alert and oriented 68 year old African American female appearing younger than her stated age. Wt Readings from Last 3 Encounters:  03/25/13 192 lb 14.4 oz (87.499 kg)  01/06/13 198 lb 8 oz (90.039 kg)  09/04/12 199 lb (90.266 kg)   Temp Readings from Last 3 Encounters:  03/25/13 99 F (37.2 C) Oral  01/06/13 98 F (36.7 C) Oral  07/01/12 97.7 F (36.5 C) Oral   BP Readings from Last 3 Encounters:  03/25/13 153/69  01/06/13 139/70  09/04/12 124/72   Pulse Readings from Last 3 Encounters:  03/25/13 72  01/06/13 71  09/04/12 66   Head and neck examination: Grossly unremarkable. Nodes: There is no palpable cervical, supraclavicular, or axillary lymphadenopathy. Chest: Lungs clear. Breasts: She is large breasted. There is a punctate biopsy wound at approximately 5:00 along the lower inner quadrant of the right breast. No masses are appreciated. Left breast without masses or lesions. Abdomen: Obese, without hepatomegaly. Extremities: Without edema.    LABORATORY DATA:  Lab Results  Component Value  Date   WBC 9.9 03/25/2013   HGB 9.1* 03/25/2013   HCT 27.6* 03/25/2013   MCV 77.1* 03/25/2013   PLT 369 03/25/2013   Lab Results  Component Value Date   NA 142 03/25/2013   K 3.9 03/25/2013   CL 107 12/11/2011   CO2 24 03/25/2013   Lab Results  Component Value Date   ALT 13 03/25/2013   AST 13 03/25/2013   ALKPHOS 99 03/25/2013   BILITOT 0.24 03/25/2013      IMPRESSION: Stage  I (T1, N0, M0) invasive ductal carcinoma of the right breast. I explained to the patient and her family that her local management options include mastectomy versus partial mastectomy followed by radiation therapy. We also briefly discussed MammoSite balloon brachytherapy. She is not a candidate for hypo-fractionated treatment in view of her breast size. After lengthy discussion she is most interested in standard fractionated radiation therapy following conservative surgery. I discussed the potential acute and late toxicities of radiation therapy. Her prognosis is excellent. She may be considered for adjuvant hormone therapy following completion of radiation therapy.   PLAN: As discussed above.  I spent 30 minutes minutes face to face with the patient and more than 50% of that time was spent in counseling and/or coordination of care.

## 2013-03-25 NOTE — Progress Notes (Signed)
Chief complaint:  New right breast cancer  HISTORY: Patient is a 68 year old female who presents with a new diagnosis of right breast cancer. This was a screening detected right breast mass.  This is a grade 1 infiltrating ductal carcinoma, ER/PR positive, Her 2 negative, Ki67 20%.  She has not had breast pain or unable to palpate a mass. She did have a mother that had breast cancer diagnosed at age 28, and a brother diagnosed with pancreatic cancer at age 40. Menarche was at age 47. She is postmenopausal. She did use hormone replacement for 6-7 years. She also used hormonal contraception but does not remember how long. She. 5 children to term with the first one being at age 19. She has had a colonoscopy and a bone density study.  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   Past Medical History  Diagnosis Date  . Hypertension     2006 B renal arteries patent. 2003 MRA - no RAS. 2003 pheo W/U Dr Hassell Done reportedly negative.  Marland Kitchen CAD (coronary artery disease) 1996    1996 - PTCA and angioplasty diagonal branch. 2000 - Rotoblator & angiopllasty of diagonal. 2006 - subendocardial AMI, DES to proximal LAD.Marland Kitchen Also had 90% stenosis in distal apical LAD. EF 55 with apical hypokinesis. Indefinite ASA and Plavix.  . Abdominal discomfort     Chronic N/V/D. Presumptive dx Crohn's dx per elevated p ANCA. Failed Entocort and Pentasa. Sep 2003 - ileocolectomy c anastomosis per Dr Deon Pilling 2/2 adhesions - path was hegative for Crohns. EGD, Sm bowel follow through (11/03), and an eteroclysis (10/03) were unrevealing. Cuases hypomag and hypocalcemia.  . Anemia     Multifactorial. Baseline HgB 10-11 ish. B12 def - 150 in 3/10. Fe Def - ferritin 35 3/10. Both are being repleted.  . Stroke     Incidental finding MRI 2002 L lacunar infarct  . Diabetes mellitus     Insulin dependent  . Allergy     Seasonal  . Chronic pain     CT 10/10 = Spinal stenosis L2 - S1.  Marland Kitchen  Chronic kidney disease     Chronic renal insuff baseline Cr 1.2 - 1.4 ish.  . Adnexal mass 8/03    s/p lap BSO (R ovarian fibroma) & lysis of adhesions  . Hyperlipidemia     Managed with both a statin and Welchol. Welchol stopped 2014 2/2 cost and started on fenofibrate     Past Surgical History  Procedure Laterality Date  . Ptca  4/06  . Bowel resection  2003    ileocolectomy with anastomosis 2/2 adhesions  . Cholecystectomy    . Hernia repair      Ventral hernia repair  . Bilateral salpingoophorectomy  8/03    Lap BSO (R ovarian fibroma) and adhesion lysis  . Hemicolectomy      R sided hemicolectomy    Current Outpatient Prescriptions  Medication Sig Dispense Refill  . 1st Choice Lancets Thin MISC by Does not apply route 2 (two) times daily.        Marland Kitchen amLODipine (NORVASC) 10 MG tablet Take 1 tablet (10 mg total) by mouth daily.  90 tablet  3  . aspirin 81 MG tablet Take 1 tablet (81 mg total) by mouth daily.  90 tablet  3  . atenolol (TENORMIN) 50 MG tablet TAKE ONE TABLET BY MOUTH TWICE DAILY  180 tablet  3  . atorvastatin (LIPITOR) 20  MG tablet Take 1 tablet (20 mg total) by mouth at bedtime.  90 tablet  3  . clopidogrel (PLAVIX) 75 MG tablet TAKE ONE (1) TABLET EACH DAY  90 tablet  3  . cyanocobalamin (,VITAMIN B-12,) 1000 MCG/ML injection Inject 1 mL (1,000 mcg total) into the muscle every 30 (thirty) days.  10 mL  1  . fenofibrate (TRICOR) 48 MG tablet Take 1 tablet (48 mg total) by mouth daily.  30 tablet  11  . ferrous sulfate 325 (65 FE) MG tablet Take 1 tablet (325 mg total) by mouth 3 (three) times daily with meals.  270 tablet  3  . furosemide (LASIX) 20 MG tablet TAKE ONE TABLET BY MOUTH TWICE DAILY  180 tablet  3  . glucose blood (ONE TOUCH ULTRA TEST) test strip 1 each by Other route 2 (two) times daily. icd 9 250.00  100 each  11  . hydrOXYzine (VISTARIL) 50 MG capsule Take 1 capsule (50 mg total) by mouth at bedtime as needed.  90 capsule  3  . insulin aspart  (NOVOLOG) 100 UNIT/ML injection Inject 2 units before lunch and dinner every day.  20 mL  3  . insulin glargine (LANTUS) 100 UNIT/ML injection Inject 0.3 mLs (30 Units total) into the skin at bedtime.  30 mL  3  . Insulin Syringe-Needle U-100 (INSULIN SYRINGE 1CC/28G) 28G X 1/2" 1 ML MISC by Does not apply route.        Marland Kitchen lisinopril (PRINIVIL,ZESTRIL) 10 MG tablet Take 1 tablet (10 mg total) by mouth daily.  90 tablet  3  . mometasone (NASONEX) 50 MCG/ACT nasal spray Place 2 sprays into the nose daily.  51 g  3  . nitroGLYCERIN (NITROSTAT) 0.4 MG SL tablet Place 1 tablet (0.4 mg total) under the tongue every 5 (five) minutes as needed.  90 tablet  3  . potassium chloride (K-DUR) 10 MEQ tablet Take 2 tablets (20 mEq total) by mouth daily.  60 tablet  11  . zolpidem (AMBIEN) 5 MG tablet Take 1 tablet (5 mg total) by mouth at bedtime as needed for sleep.  30 tablet  0  . zoster vaccine live, PF, (ZOSTAVAX) 38250 UNT/0.65ML injection Inject 19,400 Units into the skin once.  1 each  0   No current facility-administered medications for this visit.     Allergies  Allergen Reactions  . Diazepam     REACTION: Agitation  . Haloperidol Lactate   . Lorazepam Nausea Only  . Morphine Sulfate     REACTION: Agitation  . Propoxyphene Hcl     REACTION: Vomiting  . Tramadol Hcl     REACTION: sick on stomach     Family History  Problem Relation Age of Onset  . Cancer Brother 45    pancreatic  . Cancer Mother   . Breast cancer Mother   . Hypertension Daughter      History   Social History  . Marital Status: Divorced    Spouse Name: N/A    Number of Children: N/A  . Years of Education: N/A   Social History Main Topics  . Smoking status: Never Smoker   . Smokeless tobacco: Never Used  . Alcohol Use: No  . Drug Use: No  . Sexual Activity: Not on file    REVIEW OF SYSTEMS - PERTINENT POSITIVES ONLY: 12 point review of systems negative other than HPI and PMH except for foot swelling,  SOB on stairs, hip and back pain, diabetes, anemia  EXAM: Wt Readings from Last 3 Encounters:  03/25/13 192 lb 14.4 oz (87.499 kg)  01/06/13 198 lb 8 oz (90.039 kg)  09/04/12 199 lb (90.266 kg)   Temp Readings from Last 3 Encounters:  03/25/13 99 F (37.2 C) Oral  01/06/13 98 F (36.7 C) Oral  07/01/12 97.7 F (36.5 C) Oral   BP Readings from Last 3 Encounters:  03/25/13 153/69  01/06/13 139/70  09/04/12 124/72   Pulse Readings from Last 3 Encounters:  03/25/13 72  01/06/13 71  09/04/12 66     Gen:  No acute distress.  Well nourished and well groomed.   Neurological: Alert and oriented to person, place, and time. Coordination normal.  Head: Normocephalic and atraumatic.  Eyes: Conjunctivae are normal. Pupils are equal, round, and reactive to light. No scleral icterus.  Neck: Normal range of motion. Neck supple. No tracheal deviation or thyromegaly present.  Cardiovascular: Normal rate, regular rhythm, normal heart sounds and intact distal pulses.  Exam reveals no gallop and no friction rub.  No murmur heard. Respiratory: Effort normal.  No respiratory distress. No chest wall tenderness. Breath sounds normal.  No wheezes, rales or rhonchi.  Breast:  No palpable masses.  Breasts ptotic and symmetric bilaterally.  No nipple discharge or skin dimpling.  No nipple retraction.  No LAD   GI: Soft. Bowel sounds are normal. The abdomen is soft and nontender.  There is no rebound and no guarding.  Musculoskeletal: Normal range of motion. Extremities are nontender.  Lymphadenopathy: No cervical, preauricular, postauricular or axillary adenopathy is present Skin: Skin is warm and dry. No rash noted. No diaphoresis. No erythema. No pallor. No clubbing, cyanosis, or edema.   Psychiatric: Normal mood and affect. Behavior is normal. Judgment and thought content normal.    LABORATORY RESULTS: Available labs are reviewed  Pathology Diagnosis Breast, right, needle core biopsy, mass, 5  o'clock - INVASIVE DUCTAL CARCINOMA. - PLEASE SEE COMMENT.  CMET Cr 1.5, otherwise essentially normal.  CBC, HCT 27.6  RADIOLOGY RESULTS: See E-Chart or I-Site for most recent results.  Images and reports are reviewed.  MRI - not performed.  Fatty replaced breasts.  Mammogram/ultrasound Ultrasound is performed, showing a hypoechoic micro lobulated mass  with posterior acoustic shadowing and internal blood flow. It lies  in the 5 o'clock position 3 cm from the nipple corresponding to the  mammographic abnormality. It also corresponds in size measuring 5.9  mm x 3 mm by 5.1 mm.  Sonographic imaging of the right axilla shows no masses or enlarged  or abnormal lymph nodes.  IMPRESSION:  Small mass in the 5 o'clock position of the anterior right breast is  concerning for a small breast malignancy.    ASSESSMENT AND PLAN: Breast cancer of lower-inner quadrant of right female breast Is a new diagnosis of right breast cancer. We will plan to do breast conservation therapy with a needle localized lumpectomy, sentinel lymph node biopsy, and radiation therapy. She has discussed radiation with Dr. Valere Dross. She will also likely get anti-estrogen therapy postoperatively.  Because of the patient's mother's breast cancer, we will have her evaluated by genetics.  The surgical procedure was described to the patient.  I discussed the incision type and location and that we would need radiology involved on the day of surgery with a wire marker and sentinel node injection.  The risks and benefits of the procedure were described to the patient and he/she wishes to proceed.    We discussed the risks bleeding,  infection, damage to other structures, need for further procedures/surgeries.  We discussed the risk of seroma.  The patient was advised if the area in the breast in cancer, we may need to go back to surgery for additional tissue to obtain negative margins or for a lymph node biopsy. The patient was  advised that these are the most common complications, but that others can occur as well.  They were advised against taking aspirin or other anti-inflammatory agents/blood thinners the week before surgery.    45 min spent in evaluation, examination, counseling, and coordination of care.  >50% spent in counseling.        Milus Height MD Surgical On[facility]      Visit Diagnoses: 1. Breast cancer, right   2. Breast cancer of lower-inner quadrant of right female breast     Primary Care Physician: Larey Dresser, MD  [facility] medical [facility]  [facility] radiation oncology  Bary Castilla, RN nurse navigator.

## 2013-03-25 NOTE — Assessment & Plan Note (Signed)
Is a new diagnosis of right breast cancer. We will plan to do breast conservation therapy with a needle localized lumpectomy, sentinel lymph node biopsy, and radiation therapy. She has discussed radiation with Dr. Valere Dross. She will also likely get anti-estrogen therapy postoperatively.  The surgical procedure was described to the patient.  I discussed the incision type and location and that we would need radiology involved on the day of surgery with a wire marker and sentinel node injection.  The risks and benefits of the procedure were described to the patient and he/she wishes to proceed.    We discussed the risks bleeding, infection, damage to other structures, need for further procedures/surgeries.  We discussed the risk of seroma.  The patient was advised if the area in the breast in cancer, we may need to go back to surgery for additional tissue to obtain negative margins or for a lymph node biopsy. The patient was advised that these are the most common complications, but that others can occur as well.  They were advised against taking aspirin or other anti-inflammatory agents/blood thinners the week before surgery.    45 min spent in evaluation, examination, counseling, and coordination of care.  >50% spent in counseling.

## 2013-03-26 ENCOUNTER — Encounter: Payer: Self-pay | Admitting: *Deleted

## 2013-03-26 NOTE — Progress Notes (Signed)
[place] Work  Clinical Social Work met with Pt, her sister and two daughters at her breast clinic appointment to review distress screen and assess for further concerns. Pt scored a 0 on the distress screen and denies current psychosocial concerns. Pt shared she has a great support network and resources to assist her. CSW reviewed emotions to expect and educated Pt that her measure of distress may go up and down as she goes through her cancer treatment. CSW also reviewed resources and supports to assist her. Pt stated understanding and denied questions or concerns currently. Pt was given CSW contact info and support team info for needed follow up.     Clinical Social Work interventions: Patient education  Loren Racer, [place] Clinical Social Worker [facility] for [place] Wednesday, Thursday and Friday Phone: [phone] Fax: [phone]

## 2013-04-02 ENCOUNTER — Telehealth: Payer: Self-pay | Admitting: *Deleted

## 2013-04-02 NOTE — Telephone Encounter (Signed)
Called and spoke with patient from [facility] 03/25/13.  No questions or concerns at this time.  Confirmed follow up with Dr. Humphrey Rolls for 05/05/13 at 11am.

## 2013-04-05 ENCOUNTER — Encounter: Payer: Self-pay | Admitting: Oncology

## 2013-04-05 NOTE — Progress Notes (Signed)
[name] [id number] February 08, 1946 68 y.o. 04/05/2013 1:53 PM  CC   Lare[place] Dresser, [place] [id number] Dr. Arloa Koh Dr. Theda Sers   REASON FOR CONSULTATION:  69 year old female with new diagnosis of stage I invasive ductal carcinoma of the right breast. Patient is seen in the multidisciplinary breast clinic for discussion of treatment options.  STAGE:   Breast cancer of lower-inner quadrant of right female breast   Primary site: Breast (Right)   Staging method: AJCC 7th Edition   Clinical: Stage IA (T1b, N0, cM0)   Summary: Stage IA (T1b, N0, cM0)  REFERRING PHYSICIAN: Dr. Theda Sers  HISTORY OF PRESENT ILLNESS:  [name] is a 68 y.o. female.  Would medical history significant for hypertension coronary artery disease chronic abdominal discomfort and strokes. Patient underwent a screening mammogram on 02/06/2013.possibly showed a mass in the right breast. On 02/27/2013 she underwent a diagnostic mammogram and ultrasound that showed 6 x 5 mm mass at 5:00 position. Lymph nodes were not enlarged. She did not have an MRI. She underwent a biopsy on 03/16/2013 this revealed invasive ductal carcinoma, low grade. Tumor was ER positive PR positive HER-2/neu negative with a proliferation marker Ki-67 of 20%. Her case was discussed at the multidisciplinary breast conference. Radiology and pathology were reviewed. She is now seen in the multidisciplinary breast clinic for discussion of treatment options.   Past Medical History: Past Medical History  Diagnosis Date  . Hypertension     2006 B renal arteries patent. 2003 MRA - no RAS. 2003 pheo W/U Dr Hassell Done reportedly negative.  Marland Kitchen CAD (coronary artery disease) 1996    1996 - PTCA and angioplasty diagonal branch. 2000 - Rotoblator & angiopllasty of diagonal. 2006 - subendocardial AMI, DES to proximal LAD.Marland Kitchen Also had 90% stenosis in distal apical LAD. EF 55 with apical hypokinesis. Indefinite ASA  and Plavix.  . Abdominal discomfort     Chronic N/V/D. Presumptive dx Crohn's dx per elevated p ANCA. Failed Entocort and Pentasa. Sep 2003 - ileocolectomy c anastomosis per Dr Deon Pilling 2/2 adhesions - path was hegative for Crohns. EGD, Sm bowel follow through (11/03), and an eteroclysis (10/03) were unrevealing. Cuases hypomag and hypocalcemia.  . Anemia     Multifactorial. Baseline HgB 10-11 ish. B12 def - 150 in 3/10. Fe Def - ferritin 35 3/10. Both are being repleted.  . Stroke     Incidental finding MRI 2002 L lacunar infarct  . Diabetes mellitus     Insulin dependent  . Allergy     Seasonal  . Chronic pain     CT 10/10 = Spinal stenosis L2 - S1.  Marland Kitchen Chronic kidney disease     Chronic renal insuff baseline Cr 1.2 - 1.4 ish.  . Adnexal mass 8/03    s/p lap BSO (R ovarian fibroma) & lysis of adhesions  . Hyperlipidemia     Managed with both a statin and Welchol. Welchol stopped 2014 2/2 cost and started on fenofibrate     Past Surgical History: Past Surgical History  Procedure Laterality Date  . Ptca  4/06  . Bowel resection  2003    ileocolectomy with anastomosis 2/2 adhesions  . Cholecystectomy    . Hernia repair      Ventral hernia repair  . Bilateral salpingoophorectomy  8/03    Lap BSO (R ovarian fibroma) and adhesion lysis  . Hemicolectomy      R sided hemicolectomy    Family History: Family History  Problem Relation Age of Onset  . Cancer Brother 42    pancreatic  . Cancer Mother   . Breast cancer Mother   . Hypertension Daughter     Social History History  Substance Use Topics  . Smoking status: Never Smoker   . Smokeless tobacco: Never Used  . Alcohol Use: No    Allergies: Allergies  Allergen Reactions  . Diazepam     REACTION: Agitation  . Haloperidol Lactate   . Lorazepam Nausea Only  . Morphine Sulfate     REACTION: Agitation  . Propoxyphene Hcl     REACTION: Vomiting  . Tramadol Hcl     REACTION: sick on stomach    Current  Medications: Current Outpatient Prescriptions  Medication Sig Dispense Refill  . 1st Choice Lancets Thin MISC by Does not apply route 2 (two) times daily.        Marland Kitchen amLODipine (NORVASC) 10 MG tablet Take 1 tablet (10 mg total) by mouth daily.  90 tablet  3  . aspirin 81 MG tablet Take 1 tablet (81 mg total) by mouth daily.  90 tablet  3  . atenolol (TENORMIN) 50 MG tablet TAKE ONE TABLET BY MOUTH TWICE DAILY  180 tablet  3  . atorvastatin (LIPITOR) 20 MG tablet Take 1 tablet (20 mg total) by mouth at bedtime.  90 tablet  3  . clopidogrel (PLAVIX) 75 MG tablet TAKE ONE (1) TABLET EACH DAY  90 tablet  3  . cyanocobalamin (,VITAMIN B-12,) 1000 MCG/ML injection Inject 1 mL (1,000 mcg total) into the muscle every 30 (thirty) days.  10 mL  1  . fenofibrate (TRICOR) 48 MG tablet Take 1 tablet (48 mg total) by mouth daily.  30 tablet  11  . ferrous sulfate 325 (65 FE) MG tablet Take 1 tablet (325 mg total) by mouth 3 (three) times daily with meals.  270 tablet  3  . furosemide (LASIX) 20 MG tablet TAKE ONE TABLET BY MOUTH TWICE DAILY  180 tablet  3  . glucose blood (ONE TOUCH ULTRA TEST) test strip 1 each by Other route 2 (two) times daily. icd 9 250.00  100 each  11  . hydrOXYzine (VISTARIL) 50 MG capsule Take 1 capsule (50 mg total) by mouth at bedtime as needed.  90 capsule  3  . insulin aspart (NOVOLOG) 100 UNIT/ML injection Inject 2 units before lunch and dinner every day.  20 mL  3  . insulin glargine (LANTUS) 100 UNIT/ML injection Inject 0.3 mLs (30 Units total) into the skin at bedtime.  30 mL  3  . Insulin Syringe-Needle U-100 (INSULIN SYRINGE 1CC/28G) 28G X 1/2" 1 ML MISC by Does not apply route.        Marland Kitchen lisinopril (PRINIVIL,ZESTRIL) 10 MG tablet Take 1 tablet (10 mg total) by mouth daily.  90 tablet  3  . mometasone (NASONEX) 50 MCG/ACT nasal spray Place 2 sprays into the nose daily.  51 g  3  . zolpidem (AMBIEN) 5 MG tablet Take 1 tablet (5 mg total) by mouth at bedtime as needed for sleep.   30 tablet  0  . nitroGLYCERIN (NITROSTAT) 0.4 MG SL tablet Place 1 tablet (0.4 mg total) under the tongue every 5 (five) minutes as needed.  90 tablet  3  . potassium chloride (K-DUR) 10 MEQ tablet Take 2 tablets (20 mEq total) by mouth daily.  60 tablet  11  . zoster vaccine live, PF, (ZOSTAVAX) 09233 UNT/0.65ML injection Inject 19,400 Units  into the skin once.  1 each  0   No current facility-administered medications for this visit.    OB/GYN History:at age 63. Patient is postmenopausal. She did take hormone replacement therapy for 6-7 years. She is not taking that anymore. She's had 5 live births first live birth was at 55.  ECOG PERFORMANCE STATUS: 0 - Asymptomatic   REVIEW OF SYSTEMS:  comprehenvise 14 point ROS was obtained and is scanned separatelu in the electronic medical record  PHYSICAL EXAMINATION: Blood pressure 153/69, pulse 72, temperature 99 F (37.2 C), temperature source Oral, resp. rate 18, height 5' 4" (1.626 m), weight 192 lb 14.4 oz (87.499 kg).  CXF:QHKUV, no distress, well nourished and well developed SKIN: skin color, texture, turgor are normal HEAD: Normocephalic EYES: PERRLA, EOMI EARS: External ears normal OROPHARYNX:no exudate, no erythema and lips, buccal mucosa, and tongue normal  NECK: supple, no adenopathy LYMPH:  no palpable lymphadenopathy BREAST:breasts appear normal, no suspicious masses, no skin or nipple changes or axillary nodes LUNGS: clear to auscultation  HEART: regular rate & rhythm ABDOMEN:abdomen soft, non-tender, obese, normal bowel sounds and no masses or organomegaly BACK: Back symmetric, no curvature., No CVA tenderness EXTREMITIES:no edema  NEURO: alert & oriented x 3 with fluent speech, no focal motor/sensory deficits, gait normal, reflexes normal and symmetric     STUDIES/RESULTS: Mm Digital Diagnostic Unilat R  03/16/2013   CLINICAL DATA:  Right breast mass  EXAM: POST-BIOPSY CLIP PLACEMENT right DIAGNOSTIC MAMMOGRAM   COMPARISON:  Previous exams.  FINDINGS: Films are performed following ultrasound guided biopsy of a right breast mass. Images show a ribbon shaped clip in the lower inner quadrant of the right breast, anteriorly in the area of previously seen mass although the mass is not visualized on post biopsy images.  IMPRESSION: Adequate clip placement following ultrasound-guided right breast biopsy.  Final Assessment: Post Procedure Mammograms for Marker Placement   Electronically Signed   By: Duke Salvia M.D.   On: 03/16/2013 14:52   Mm Radiologist Eval And Mgmt  03/17/2013   EXAM: ESTABLISHED PATIENT OFFICE VISIT - LEVEL II ([id number])  EXAM: The right breast was examined. The biopsy site is unremarkable, clean and dry. Patient reports no problems at the biopsy site.  HISTORY OF PRESENT ILLNESS: Patient status post recent ultrasound guided biopsy of right breast mass.  CHIEF COMPLAINT: Patient presents for biopsy results.  PATHOLOGY: Right breast:  Invasive ductal carcinoma.  ASSESSMENT AND PLAN: ASSESSMENT AND PLAN Patient needs surgical referral. Patient has an appointment scheduled with the multi disciplinary breast conference on 03/25/2013.  These results were discussed directly with the patient.  Additionally these results were called to Dr. Zenovia Jarred office at 2:45 p.m. on 03/17/2013. The results were relayed to: Morrison Old, RN.   Electronically Signed   By: Lovey Newcomer M.D.   On: 03/17/2013 14:45   [place] Rt Breast Bx W Loc Dev 1st Lesion Img Bx Spec US Guide  03/16/2013   CLINICAL DATA:  Right breast mass.  EXAM: ULTRASOUND GUIDED right BREAST CORE NEEDLE BIOPSY  COMPARISON:  Previous exams.  FINDINGS: I met with the patient and we discussed the procedure of ultrasound-guided biopsy, including benefits and alternatives. We discussed the high likelihood of a successful procedure. We discussed the risks of the procedure, including infection, bleeding, tissue injury, clip migration, and inadequate sampling.  Informed written consent was given. The usual time-out protocol was performed immediately prior to the procedure.  Using sterile technique and 2% Lidocaine as local anesthetic,  under direct ultrasound visualization, a 14 gauge spring-loaded device was used to perform biopsy of the mass using a lateral approach. At the conclusion of the procedure a ribbon tissue marker clip was deployed into the biopsy cavity. Follow up 2 view mammogram was performed and dictated separately.  IMPRESSION: Ultrasound guided biopsy of a right breast mass. No apparent complications.   Electronically Signed   By: Duke Salvia M.D.   On: 03/16/2013 14:50     LABS:    Chemistry      Component Value Date/Time   NA 142 03/25/2013 1236   NA 139 12/11/2011 1125   K 3.9 03/25/2013 1236   K 4.5 12/11/2011 1125   CL 107 12/11/2011 1125   CO2 24 03/25/2013 1236   CO2 23 12/11/2011 1125   BUN 22.9 03/25/2013 1236   BUN 27* 12/11/2011 1125   CREATININE 1.4* 03/25/2013 1236   CREATININE 1.16* 12/11/2011 1125   CREATININE 1.3* 12/26/2010 1440      Component Value Date/Time   CALCIUM 9.3 03/25/2013 1236   CALCIUM 9.2 12/11/2011 1125   ALKPHOS 99 03/25/2013 1236   ALKPHOS 113 05/05/2010 0928   AST 13 03/25/2013 1236   AST 17 05/05/2010 0928   ALT 13 03/25/2013 1236   ALT 18 05/05/2010 0928   BILITOT 0.24 03/25/2013 1236   BILITOT 0.5 05/05/2010 0928      Lab Results  Component Value Date   WBC 9.9 03/25/2013   HGB 9.1* 03/25/2013   HCT 27.6* 03/25/2013   MCV 77.1* 03/25/2013   PLT 369 03/25/2013    ASSESSMENT    68 year old female with  1. Stage I (T1Nx) IDC, ER/PR+ Her2- right breast cancer good candidate for lumpectomy  2. Post lumpectomy she will be a candidate for post lumpectomy RT  3. Adjuvant anti-estrogen therapy with an AI. We discussed rational for this.  4. She may be a candidate for oncotype Dx depending on the final size of her tumor  Clinical Trial Eligibility: no Multidisciplinary conference discussion yes     PLAN:     1. Proceed with lumpectomy and SLN  2. I will see the patient back after the surgery        Discussion: Patient is being treated per NCCN breast cancer care guidelines appropriate for stage.I   Thank you so much for allowing me to participate in the care of [name]. I will continue to follow up the patient with you and assist in her care.  All questions were answered. The patient knows to call the clinic with any problems, questions or concerns. We can certainly see the patient much sooner if necessary.  I spent 30 minutes counseling the patient face to face. The total time spent in the appointment was 55 minutes.  Marcy Panning, MD Medical/Oncology [facility] [phone] (beeper) [phone] (Office)

## 2013-04-07 ENCOUNTER — Encounter: Payer: Self-pay | Admitting: *Deleted

## 2013-04-07 NOTE — Progress Notes (Signed)
Faxed Care Plan to Leigh at [facility] & to PCP.  Took to Med Rec to scan.

## 2013-04-16 ENCOUNTER — Encounter (HOSPITAL_BASED_OUTPATIENT_CLINIC_OR_DEPARTMENT_OTHER): Payer: Self-pay | Admitting: *Deleted

## 2013-04-16 ENCOUNTER — Telehealth: Payer: Self-pay | Admitting: Dietician

## 2013-04-16 NOTE — Telephone Encounter (Signed)
Called patient about her blood sugars. She is taking lantus 30 units daily and , 2 units novolog before lunch & supper. She checks her blood sugar once in the am fasting and sometimes after she eats, but hasn't done this recently.  Most blood sugars for the past week have been 200-300 fasting as below. They appear to be trending down and patient does not report any known cause(no changes in food, activity, medicine) and also denies signs and symptoms of hypoglycemia. She wants to be sure her blood sugars are well enough controlled for her upcoming surgery 04/16/13: 158 today.  04/15/13: 208 04/14/13: 254 04/13/13: 263 04/12/13: 331 04/11/13: 286 04/10/13: 206  Typical and today's Meal intake:  Breakfast:cheese toast x1,  Snack: applesauce Lunch: 3 baked chix wings, salad, water Dinner chix and green beans, lemonade 8 oz. Snack: had none last night  And doesn't usually eat one  Cancer doctor gave her a two week supplement that she is almost finished and she asks if this may have affected her CBGs. She does not remember if these blood sugar are different from how they were around the holidays because she is currently at work and doesn't have that information with her.  I asked her to check her blood sugars now, then before and after dinner tonight and call those blood sugars into office tomorrow.

## 2013-04-16 NOTE — Progress Notes (Signed)
Labs were done cc 03/25/13-ekg dr Cathie Olden 6/14 Hx 3 stents on plavix-dr byerly has told her to hold for surgery Her daughters will come with her

## 2013-04-17 ENCOUNTER — Telehealth: Payer: Self-pay | Admitting: *Deleted

## 2013-04-17 NOTE — Telephone Encounter (Signed)
Spoke to pt advised per [name] Plyler Diabetes Educator to have pt take her blood sugars over the weekend at dinnertime, bedtime and fasting in the am.  Sugars are to be called to the Clinics on Monday.  Pt voiced understanding of the plan and will test and call results to the Clinics on Monday.  Sander Nephew, RN 04/16/2013 5:15 PM.

## 2013-04-17 NOTE — Telephone Encounter (Signed)
Pt also wanted to know if her Novalog can be changed to something less expensive if possible.  Sander Nephew, RN 04/17/2013 4:59 PM.

## 2013-04-20 ENCOUNTER — Telehealth: Payer: Self-pay | Admitting: *Deleted

## 2013-04-20 NOTE — Telephone Encounter (Signed)
Call from pt with glucose results.  04/18/2013  AM- 179. 2:00 PM 153. Supper 203.  Bedtime 279.  Sunday 04/19/2013 - 208 AM. 4:00 PM- 178 9:00 PM-178.  Sander Nephew, RN 04/20/2013 8:30 AM

## 2013-04-20 NOTE — Telephone Encounter (Addendum)
Blood sugars reviewed and patient called: had her increase her Novolog prandial coverage at lunch by 1 unit to decrease before dinner blood sugars of 203 and 178. She verbalized understanding and agreed to check blood sugar before dinner and bedtime to monitor the effects of this change. She says her cost for novolog was 192.00 and this is what she has been paying so it has nothing to do with it being the first of the year and her meeting her deductible. Told her I would try calling her pharmacy to assist her.

## 2013-04-22 ENCOUNTER — Encounter (HOSPITAL_COMMUNITY)
Admission: RE | Admit: 2013-04-22 | Discharge: 2013-04-22 | Disposition: A | Discharge status: Home / Self Care | Payer: Medicare Other | Source: Ambulatory Visit | Attending: General Surgery | Admitting: General Surgery

## 2013-04-22 ENCOUNTER — Telehealth (INDEPENDENT_AMBULATORY_CARE_PROVIDER_SITE_OTHER): Payer: Self-pay | Admitting: General Surgery

## 2013-04-22 ENCOUNTER — Ambulatory Visit (HOSPITAL_BASED_OUTPATIENT_CLINIC_OR_DEPARTMENT_OTHER)
Admission: RE | Admit: 2013-04-22 | Discharge: 2013-04-22 | Disposition: A | Discharge status: Home / Self Care | Payer: Medicare Other | Source: Ambulatory Visit | Attending: General Surgery | Admitting: General Surgery

## 2013-04-22 ENCOUNTER — Ambulatory Visit (HOSPITAL_BASED_OUTPATIENT_CLINIC_OR_DEPARTMENT_OTHER): Payer: Medicare Other | Admitting: Anesthesiology

## 2013-04-22 ENCOUNTER — Other Ambulatory Visit (INDEPENDENT_AMBULATORY_CARE_PROVIDER_SITE_OTHER): Payer: Self-pay | Admitting: General Surgery

## 2013-04-22 ENCOUNTER — Encounter (HOSPITAL_BASED_OUTPATIENT_CLINIC_OR_DEPARTMENT_OTHER)
Admission: RE | Disposition: A | Discharge status: Home / Self Care | Payer: Self-pay | Source: Ambulatory Visit | Attending: General Surgery

## 2013-04-22 ENCOUNTER — Encounter (HOSPITAL_BASED_OUTPATIENT_CLINIC_OR_DEPARTMENT_OTHER): Payer: Self-pay | Admitting: *Deleted

## 2013-04-22 ENCOUNTER — Ambulatory Visit
Admission: RE | Admit: 2013-04-22 | Discharge: 2013-04-22 | Disposition: A | Discharge status: Home / Self Care | Payer: Medicare Other | Source: Ambulatory Visit | Attending: General Surgery | Admitting: General Surgery

## 2013-04-22 ENCOUNTER — Encounter (HOSPITAL_BASED_OUTPATIENT_CLINIC_OR_DEPARTMENT_OTHER): Payer: Medicare Other | Admitting: Anesthesiology

## 2013-04-22 DIAGNOSIS — I739 Peripheral vascular disease, unspecified: Secondary | ICD-10-CM | POA: Insufficient documentation

## 2013-04-22 DIAGNOSIS — Z9861 Coronary angioplasty status: Secondary | ICD-10-CM | POA: Insufficient documentation

## 2013-04-22 DIAGNOSIS — I129 Hypertensive chronic kidney disease with stage 1 through stage 4 chronic kidney disease, or unspecified chronic kidney disease: Secondary | ICD-10-CM | POA: Insufficient documentation

## 2013-04-22 DIAGNOSIS — Z794 Long term (current) use of insulin: Secondary | ICD-10-CM | POA: Insufficient documentation

## 2013-04-22 DIAGNOSIS — I2510 Atherosclerotic heart disease of native coronary artery without angina pectoris: Secondary | ICD-10-CM | POA: Insufficient documentation

## 2013-04-22 DIAGNOSIS — Z170 Estrogen receptor positive status [ER+]: Secondary | ICD-10-CM | POA: Insufficient documentation

## 2013-04-22 DIAGNOSIS — E785 Hyperlipidemia, unspecified: Secondary | ICD-10-CM | POA: Insufficient documentation

## 2013-04-22 DIAGNOSIS — Z8673 Personal history of transient ischemic attack (TIA), and cerebral infarction without residual deficits: Secondary | ICD-10-CM | POA: Insufficient documentation

## 2013-04-22 DIAGNOSIS — C50319 Malignant neoplasm of lower-inner quadrant of unspecified female breast: Secondary | ICD-10-CM | POA: Insufficient documentation

## 2013-04-22 DIAGNOSIS — Z803 Family history of malignant neoplasm of breast: Secondary | ICD-10-CM | POA: Insufficient documentation

## 2013-04-22 DIAGNOSIS — C50911 Malignant neoplasm of unspecified site of right female breast: Secondary | ICD-10-CM

## 2013-04-22 DIAGNOSIS — E119 Type 2 diabetes mellitus without complications: Secondary | ICD-10-CM | POA: Insufficient documentation

## 2013-04-22 DIAGNOSIS — N189 Chronic kidney disease, unspecified: Secondary | ICD-10-CM | POA: Insufficient documentation

## 2013-04-22 DIAGNOSIS — Z7982 Long term (current) use of aspirin: Secondary | ICD-10-CM | POA: Insufficient documentation

## 2013-04-22 DIAGNOSIS — G8929 Other chronic pain: Secondary | ICD-10-CM | POA: Insufficient documentation

## 2013-04-22 DIAGNOSIS — C50919 Malignant neoplasm of unspecified site of unspecified female breast: Secondary | ICD-10-CM

## 2013-04-22 HISTORY — DX: Unspecified right bundle-branch block: I45.10

## 2013-04-22 HISTORY — PX: BREAST LUMPECTOMY: SHX2

## 2013-04-22 HISTORY — DX: Presence of dental prosthetic device (complete) (partial): Z97.2

## 2013-04-22 HISTORY — PX: BREAST LUMPECTOMY WITH NEEDLE LOCALIZATION AND AXILLARY SENTINEL LYMPH NODE BX: SHX5760

## 2013-04-22 LAB — GLUCOSE, CAPILLARY
Glucose-Capillary: 215 mg/dL — ABNORMAL HIGH (ref 70–99)
Glucose-Capillary: 240 mg/dL — ABNORMAL HIGH (ref 70–99)

## 2013-04-22 LAB — POCT HEMOGLOBIN-HEMACUE: Hemoglobin: 9.5 g/dL — ABNORMAL LOW (ref 12.0–15.0)

## 2013-04-22 SURGERY — BREAST LUMPECTOMY WITH NEEDLE LOCALIZATION AND AXILLARY SENTINEL LYMPH NODE BX
Anesthesia: General | Site: Breast | Laterality: Right

## 2013-04-22 MED ORDER — LACTATED RINGERS IV SOLN
INTRAVENOUS | Status: DC
Start: 2013-04-22 — End: 2013-04-22
  Administered 2013-04-22: 20 mL/h via INTRAVENOUS
  Administered 2013-04-22: 12:00:00 via INTRAVENOUS

## 2013-04-22 MED ORDER — OXYCODONE-ACETAMINOPHEN 5-325 MG PO TABS: 1.0000 | ORAL_TABLET | ORAL | Status: DC | PRN

## 2013-04-22 MED ORDER — FENTANYL CITRATE 0.05 MG/ML IJ SOLN
50.0000 ug | INTRAMUSCULAR | Status: DC | PRN
  Administered 2013-04-22 (×2): 50 ug via INTRAVENOUS

## 2013-04-22 MED ORDER — MIDAZOLAM HCL 2 MG/2ML IJ SOLN
INTRAMUSCULAR | Status: AC
  Filled 2013-04-22: qty 2

## 2013-04-22 MED ORDER — BUPIVACAINE-EPINEPHRINE PF 0.25-1:200000 % IJ SOLN
INTRAMUSCULAR | Status: AC
  Filled 2013-04-22: qty 30

## 2013-04-22 MED ORDER — BUPIVACAINE-EPINEPHRINE PF 0.25-1:200000 % IJ SOLN
INTRAMUSCULAR | Status: AC
  Filled 2013-04-22: qty 60

## 2013-04-22 MED ORDER — ONDANSETRON HCL 4 MG PO TABS: 4.0000 mg | ORAL_TABLET | Freq: Three times a day (TID) | ORAL | Status: DC | PRN

## 2013-04-22 MED ORDER — DEXAMETHASONE SODIUM PHOSPHATE 4 MG/ML IJ SOLN
INTRAMUSCULAR | Status: DC | PRN
  Administered 2013-04-22: 10 mg via INTRAVENOUS

## 2013-04-22 MED ORDER — MIDAZOLAM HCL 2 MG/2ML IJ SOLN: 1.0000 mg | INTRAMUSCULAR | Status: DC | PRN

## 2013-04-22 MED ORDER — EPHEDRINE SULFATE 50 MG/ML IJ SOLN
INTRAMUSCULAR | Status: DC | PRN
  Administered 2013-04-22 (×2): 10 mg via INTRAVENOUS

## 2013-04-22 MED ORDER — SODIUM CHLORIDE 0.9 % IJ SOLN
INTRAMUSCULAR | Status: AC
  Filled 2013-04-22: qty 10

## 2013-04-22 MED ORDER — CEFAZOLIN SODIUM-DEXTROSE 2-3 GM-% IV SOLR
2.0000 g | INTRAVENOUS | Status: AC
  Administered 2013-04-22: 2 g via INTRAVENOUS

## 2013-04-22 MED ORDER — PROPOFOL 10 MG/ML IV BOLUS
INTRAVENOUS | Status: DC | PRN
  Administered 2013-04-22: 170 mg via INTRAVENOUS

## 2013-04-22 MED ORDER — CHLORHEXIDINE GLUCONATE 4 % EX LIQD: 1.0000 "application " | Freq: Once | CUTANEOUS | Status: DC

## 2013-04-22 MED ORDER — FENTANYL CITRATE 0.05 MG/ML IJ SOLN
INTRAMUSCULAR | Status: DC | PRN
  Administered 2013-04-22: 25 ug via INTRAVENOUS
  Administered 2013-04-22: 100 ug via INTRAVENOUS
  Administered 2013-04-22: 25 ug via INTRAVENOUS

## 2013-04-22 MED ORDER — CEFAZOLIN SODIUM-DEXTROSE 2-3 GM-% IV SOLR
INTRAVENOUS | Status: AC
  Filled 2013-04-22: qty 50

## 2013-04-22 MED ORDER — PROMETHAZINE HCL 25 MG/ML IJ SOLN
6.2500 mg | INTRAMUSCULAR | Status: DC | PRN
  Administered 2013-04-22: 6.25 mg via INTRAVENOUS
  Filled 2013-04-22: qty 1

## 2013-04-22 MED ORDER — METHYLENE BLUE 1 % INJ SOLN
INTRAMUSCULAR | Status: AC
  Filled 2013-04-22: qty 10

## 2013-04-22 MED ORDER — SODIUM CHLORIDE 0.9 % IJ SOLN
INTRAMUSCULAR | Status: DC | PRN
  Administered 2013-04-22: 12:00:00

## 2013-04-22 MED ORDER — LIDOCAINE HCL (CARDIAC) 20 MG/ML IV SOLN
INTRAVENOUS | Status: DC | PRN
  Administered 2013-04-22: 80 mg via INTRAVENOUS

## 2013-04-22 MED ORDER — ONDANSETRON HCL 4 MG/2ML IJ SOLN
INTRAMUSCULAR | Status: DC | PRN
  Administered 2013-04-22: 4 mg via INTRAVENOUS

## 2013-04-22 MED ORDER — FENTANYL CITRATE 0.05 MG/ML IJ SOLN
INTRAMUSCULAR | Status: AC
  Filled 2013-04-22: qty 4

## 2013-04-22 MED ORDER — HYDROMORPHONE HCL PF 1 MG/ML IJ SOLN: 0.2500 mg | INTRAMUSCULAR | Status: DC | PRN

## 2013-04-22 MED ORDER — BUPIVACAINE-EPINEPHRINE 0.25-1:200000 % IJ SOLN
INTRAMUSCULAR | Status: DC | PRN
Start: 2013-04-22 — End: 2013-04-22
  Administered 2013-04-22: 5 mL via INTRAPLEURAL
  Administered 2013-04-22: 10 mL via INTRAPLEURAL

## 2013-04-22 MED ORDER — TECHNETIUM TC 99M SULFUR COLLOID FILTERED
1.0000 | Freq: Once | INTRAVENOUS | Status: AC | PRN
  Administered 2013-04-22: 1 via INTRADERMAL

## 2013-04-22 MED ORDER — FENTANYL CITRATE 0.05 MG/ML IJ SOLN
INTRAMUSCULAR | Status: AC
  Filled 2013-04-22: qty 2

## 2013-04-22 SURGICAL SUPPLY — 60 items
BINDER BREAST LRG (GAUZE/BANDAGES/DRESSINGS) IMPLANT
BINDER BREAST MEDIUM (GAUZE/BANDAGES/DRESSINGS) IMPLANT
BINDER BREAST XLRG (GAUZE/BANDAGES/DRESSINGS) ×1 IMPLANT
BINDER BREAST XXLRG (GAUZE/BANDAGES/DRESSINGS) IMPLANT
BLADE HEX COATED 2.75 (ELECTRODE) ×2 IMPLANT
BLADE SURG 10 STRL SS (BLADE) ×2 IMPLANT
BLADE SURG 15 STRL LF DISP TIS (BLADE) ×1 IMPLANT
BLADE SURG 15 STRL SS (BLADE) ×2
BNDG COHESIVE 4X5 TAN STRL (GAUZE/BANDAGES/DRESSINGS) ×2 IMPLANT
CANISTER SUCT 1200ML W/VALVE (MISCELLANEOUS) ×2 IMPLANT
CHLORAPREP W/TINT 26ML (MISCELLANEOUS) ×2 IMPLANT
CLIP TI LARGE 6 (CLIP) ×2 IMPLANT
CLIP TI MEDIUM 6 (CLIP) ×6 IMPLANT
CLIP TI WIDE RED SMALL 6 (CLIP) IMPLANT
COVER MAYO STAND STRL (DRAPES) ×2 IMPLANT
COVER PROBE W GEL 5X96 (DRAPES) ×2 IMPLANT
DECANTER SPIKE VIAL GLASS SM (MISCELLANEOUS) IMPLANT
DEVICE DUBIN W/COMP PLATE 8390 (MISCELLANEOUS) IMPLANT
DRAIN CHANNEL 19F RND (DRAIN) IMPLANT
DRAPE UTILITY XL STRL (DRAPES) ×2 IMPLANT
DRSG TEGADERM 4X4.75 (GAUZE/BANDAGES/DRESSINGS) IMPLANT
ELECT REM PT RETURN 9FT ADLT (ELECTROSURGICAL) ×2
ELECTRODE REM PT RTRN 9FT ADLT (ELECTROSURGICAL) ×1 IMPLANT
EVACUATOR SILICONE 100CC (DRAIN) IMPLANT
GLOVE BIO SURGEON STRL SZ 6 (GLOVE) ×2 IMPLANT
GLOVE BIOGEL PI IND STRL 6.5 (GLOVE) ×1 IMPLANT
GLOVE BIOGEL PI INDICATOR 6.5 (GLOVE) ×1
GOWN STRL REUS W/ TWL LRG LVL3 (GOWN DISPOSABLE) ×1 IMPLANT
GOWN STRL REUS W/TWL 2XL LVL3 (GOWN DISPOSABLE) ×2 IMPLANT
GOWN STRL REUS W/TWL LRG LVL3 (GOWN DISPOSABLE) ×2
KIT MARKER MARGIN INK (KITS) ×2 IMPLANT
NDL HYPO 25X1 1.5 SAFETY (NEEDLE) ×2 IMPLANT
NDL SAFETY ECLIPSE 18X1.5 (NEEDLE) ×1 IMPLANT
NEEDLE HYPO 18GX1.5 SHARP (NEEDLE) ×2
NEEDLE HYPO 25X1 1.5 SAFETY (NEEDLE) ×4 IMPLANT
NS IRRIG 1000ML POUR BTL (IV SOLUTION) IMPLANT
PACK BASIN DAY SURGERY FS (CUSTOM PROCEDURE TRAY) ×2 IMPLANT
PACK UNIVERSAL I (CUSTOM PROCEDURE TRAY) ×2 IMPLANT
PAD ABD 8X10 STRL (GAUZE/BANDAGES/DRESSINGS) IMPLANT
PENCIL BUTTON HOLSTER BLD 10FT (ELECTRODE) ×2 IMPLANT
PIN SAFETY STERILE (MISCELLANEOUS) IMPLANT
SLEEVE SCD COMPRESS KNEE MED (MISCELLANEOUS) IMPLANT
SPONGE GAUZE 4X4 12PLY (GAUZE/BANDAGES/DRESSINGS) IMPLANT
SPONGE LAP 18X18 X RAY DECT (DISPOSABLE) ×2 IMPLANT
SPONGE LAP 4X18 X RAY DECT (DISPOSABLE) IMPLANT
STAPLER VISISTAT 35W (STAPLE) ×2 IMPLANT
STOCKINETTE IMPERVIOUS LG (DRAPES) ×2 IMPLANT
STRIP CLOSURE SKIN 1/2X4 (GAUZE/BANDAGES/DRESSINGS) ×2 IMPLANT
SUT MON AB 4-0 PC3 18 (SUTURE) ×2 IMPLANT
SUT SILK 2 0 SH (SUTURE) IMPLANT
SUT VIC AB 2-0 SH 27 (SUTURE)
SUT VIC AB 2-0 SH 27XBRD (SUTURE) IMPLANT
SUT VIC AB 3-0 SH 27 (SUTURE) ×4
SUT VIC AB 3-0 SH 27X BRD (SUTURE) ×2 IMPLANT
SYR BULB 3OZ (MISCELLANEOUS) IMPLANT
SYR CONTROL 10ML LL (SYRINGE) ×4 IMPLANT
TOWEL OR 17X24 6PK STRL BLUE (TOWEL DISPOSABLE) ×2 IMPLANT
TOWEL OR NON WOVEN STRL DISP B (DISPOSABLE) ×2 IMPLANT
TUBE CONNECTING 20X1/4 (TUBING) ×2 IMPLANT
YANKAUER SUCT BULB TIP NO VENT (SUCTIONS) ×2 IMPLANT

## 2013-04-22 NOTE — Telephone Encounter (Signed)
Called [facility] and [facility]- cost of lantus is 120.00 in 2014, but pt never picked it up. Novolog 197.99, pt has never gotten any insulin  from either pharmacy.    called [employer] Medicare:Novolog is not on formulary, can do a formulary exception or use Humalog which is a tier 3- her cost is $40.00, lantus is also tier 3 and her cost is $40.00. Pens in both are also tier 3. She has no deducible to meet.

## 2013-04-22 NOTE — Telephone Encounter (Signed)
Dr. [name] returned page and ordered Phenergan 12.5 mg, # 20 (twenty,) 1 po Q6H prn nausea/ vomiting, 1 RF.  Called to [facility]:  [phone].  Daughter updated and will pick up later this evening.

## 2013-04-22 NOTE — H&P (View-Only) (Signed)
Chief complaint:  New right breast cancer  HISTORY: Patient is a 68 year old female who presents with a new diagnosis of right breast cancer. This was a screening detected right breast mass.  This is a grade 1 infiltrating ductal carcinoma, ER/PR positive, Her 2 negative, Ki67 20%.  She has not had breast pain or unable to palpate a mass. She did have a mother that had breast cancer diagnosed at age 43, and a brother diagnosed with pancreatic cancer at age 45. Menarche was at age 43. She is postmenopausal. She did use hormone replacement for 6-7 years. She also used hormonal contraception but does not remember how long. She. 5 children to term with the first one being at age 34. She has had a colonoscopy and a bone density study.  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   Past Medical History  Diagnosis Date  . Hypertension     2006 B renal arteries patent. 2003 MRA - no RAS. 2003 pheo W/U Dr Hassell Done reportedly negative.  Marland Kitchen CAD (coronary artery disease) 1996    1996 - PTCA and angioplasty diagonal branch. 2000 - Rotoblator & angiopllasty of diagonal. 2006 - subendocardial AMI, DES to proximal LAD.Marland Kitchen Also had 90% stenosis in distal apical LAD. EF 55 with apical hypokinesis. Indefinite ASA and Plavix.  . Abdominal discomfort     Chronic N/V/D. Presumptive dx Crohn's dx per elevated p ANCA. Failed Entocort and Pentasa. Sep 2003 - ileocolectomy c anastomosis per Dr Deon Pilling 2/2 adhesions - path was hegative for Crohns. EGD, Sm bowel follow through (11/03), and an eteroclysis (10/03) were unrevealing. Cuases hypomag and hypocalcemia.  . Anemia     Multifactorial. Baseline HgB 10-11 ish. B12 def - 150 in 3/10. Fe Def - ferritin 35 3/10. Both are being repleted.  . Stroke     Incidental finding MRI 2002 L lacunar infarct  . Diabetes mellitus     Insulin dependent  . Allergy     Seasonal  . Chronic pain     CT 10/10 = Spinal stenosis L2 - S1.  Marland Kitchen  Chronic kidney disease     Chronic renal insuff baseline Cr 1.2 - 1.4 ish.  . Adnexal mass 8/03    s/p lap BSO (R ovarian fibroma) & lysis of adhesions  . Hyperlipidemia     Managed with both a statin and Welchol. Welchol stopped 2014 2/2 cost and started on fenofibrate     Past Surgical History  Procedure Laterality Date  . Ptca  4/06  . Bowel resection  2003    ileocolectomy with anastomosis 2/2 adhesions  . Cholecystectomy    . Hernia repair      Ventral hernia repair  . Bilateral salpingoophorectomy  8/03    Lap BSO (R ovarian fibroma) and adhesion lysis  . Hemicolectomy      R sided hemicolectomy    Current Outpatient Prescriptions  Medication Sig Dispense Refill  . 1st Choice Lancets Thin MISC by Does not apply route 2 (two) times daily.        Marland Kitchen amLODipine (NORVASC) 10 MG tablet Take 1 tablet (10 mg total) by mouth daily.  90 tablet  3  . aspirin 81 MG tablet Take 1 tablet (81 mg total) by mouth daily.  90 tablet  3  . atenolol (TENORMIN) 50 MG tablet TAKE ONE TABLET BY MOUTH TWICE DAILY  180 tablet  3  . atorvastatin (LIPITOR) 20  MG tablet Take 1 tablet (20 mg total) by mouth at bedtime.  90 tablet  3  . clopidogrel (PLAVIX) 75 MG tablet TAKE ONE (1) TABLET EACH DAY  90 tablet  3  . cyanocobalamin (,VITAMIN B-12,) 1000 MCG/ML injection Inject 1 mL (1,000 mcg total) into the muscle every 30 (thirty) days.  10 mL  1  . fenofibrate (TRICOR) 48 MG tablet Take 1 tablet (48 mg total) by mouth daily.  30 tablet  11  . ferrous sulfate 325 (65 FE) MG tablet Take 1 tablet (325 mg total) by mouth 3 (three) times daily with meals.  270 tablet  3  . furosemide (LASIX) 20 MG tablet TAKE ONE TABLET BY MOUTH TWICE DAILY  180 tablet  3  . glucose blood (ONE TOUCH ULTRA TEST) test strip 1 each by Other route 2 (two) times daily. icd 9 250.00  100 each  11  . hydrOXYzine (VISTARIL) 50 MG capsule Take 1 capsule (50 mg total) by mouth at bedtime as needed.  90 capsule  3  . insulin aspart  (NOVOLOG) 100 UNIT/ML injection Inject 2 units before lunch and dinner every day.  20 mL  3  . insulin glargine (LANTUS) 100 UNIT/ML injection Inject 0.3 mLs (30 Units total) into the skin at bedtime.  30 mL  3  . Insulin Syringe-Needle U-100 (INSULIN SYRINGE 1CC/28G) 28G X 1/2" 1 ML MISC by Does not apply route.        Marland Kitchen lisinopril (PRINIVIL,ZESTRIL) 10 MG tablet Take 1 tablet (10 mg total) by mouth daily.  90 tablet  3  . mometasone (NASONEX) 50 MCG/ACT nasal spray Place 2 sprays into the nose daily.  51 g  3  . nitroGLYCERIN (NITROSTAT) 0.4 MG SL tablet Place 1 tablet (0.4 mg total) under the tongue every 5 (five) minutes as needed.  90 tablet  3  . potassium chloride (K-DUR) 10 MEQ tablet Take 2 tablets (20 mEq total) by mouth daily.  60 tablet  11  . zolpidem (AMBIEN) 5 MG tablet Take 1 tablet (5 mg total) by mouth at bedtime as needed for sleep.  30 tablet  0  . zoster vaccine live, PF, (ZOSTAVAX) 82505 UNT/0.65ML injection Inject 19,400 Units into the skin once.  1 each  0   No current facility-administered medications for this visit.     Allergies  Allergen Reactions  . Diazepam     REACTION: Agitation  . Haloperidol Lactate   . Lorazepam Nausea Only  . Morphine Sulfate     REACTION: Agitation  . Propoxyphene Hcl     REACTION: Vomiting  . Tramadol Hcl     REACTION: sick on stomach     Family History  Problem Relation Age of Onset  . Cancer Brother 11    pancreatic  . Cancer Mother   . Breast cancer Mother   . Hypertension Daughter      History   Social History  . Marital Status: Divorced    Spouse Name: N/A    Number of Children: N/A  . Years of Education: N/A   Social History Main Topics  . Smoking status: Never Smoker   . Smokeless tobacco: Never Used  . Alcohol Use: No  . Drug Use: No  . Sexual Activity: Not on file    REVIEW OF SYSTEMS - PERTINENT POSITIVES ONLY: 12 point review of systems negative other than HPI and PMH except for foot swelling,  SOB on stairs, hip and back pain, diabetes, anemia  EXAM: Wt Readings from Last 3 Encounters:  03/25/13 192 lb 14.4 oz (87.499 kg)  01/06/13 198 lb 8 oz (90.039 kg)  09/04/12 199 lb (90.266 kg)   Temp Readings from Last 3 Encounters:  03/25/13 99 F (37.2 C) Oral  01/06/13 98 F (36.7 C) Oral  07/01/12 97.7 F (36.5 C) Oral   BP Readings from Last 3 Encounters:  03/25/13 153/69  01/06/13 139/70  09/04/12 124/72   Pulse Readings from Last 3 Encounters:  03/25/13 72  01/06/13 71  09/04/12 66     Gen:  No acute distress.  Well nourished and well groomed.   Neurological: Alert and oriented to person, place, and time. Coordination normal.  Head: Normocephalic and atraumatic.  Eyes: Conjunctivae are normal. Pupils are equal, round, and reactive to light. No scleral icterus.  Neck: Normal range of motion. Neck supple. No tracheal deviation or thyromegaly present.  Cardiovascular: Normal rate, regular rhythm, normal heart sounds and intact distal pulses.  Exam reveals no gallop and no friction rub.  No murmur heard. Respiratory: Effort normal.  No respiratory distress. No chest wall tenderness. Breath sounds normal.  No wheezes, rales or rhonchi.  Breast:  No palpable masses.  Breasts ptotic and symmetric bilaterally.  No nipple discharge or skin dimpling.  No nipple retraction.  No LAD   GI: Soft. Bowel sounds are normal. The abdomen is soft and nontender.  There is no rebound and no guarding.  Musculoskeletal: Normal range of motion. Extremities are nontender.  Lymphadenopathy: No cervical, preauricular, postauricular or axillary adenopathy is present Skin: Skin is warm and dry. No rash noted. No diaphoresis. No erythema. No pallor. No clubbing, cyanosis, or edema.   Psychiatric: Normal mood and affect. Behavior is normal. Judgment and thought content normal.    LABORATORY RESULTS: Available labs are reviewed  Pathology Diagnosis Breast, right, needle core biopsy, mass, 5  o'clock - INVASIVE DUCTAL CARCINOMA. - PLEASE SEE COMMENT.  CMET Cr 1.5, otherwise essentially normal.  CBC, HCT 27.6  RADIOLOGY RESULTS: See E-Chart or I-Site for most recent results.  Images and reports are reviewed.  MRI - not performed.  Fatty replaced breasts.  Mammogram/ultrasound Ultrasound is performed, showing a hypoechoic micro lobulated mass  with posterior acoustic shadowing and internal blood flow. It lies  in the 5 o'clock position 3 cm from the nipple corresponding to the  mammographic abnormality. It also corresponds in size measuring 5.9  mm x 3 mm by 5.1 mm.  Sonographic imaging of the right axilla shows no masses or enlarged  or abnormal lymph nodes.  IMPRESSION:  Small mass in the 5 o'clock position of the anterior right breast is  concerning for a small breast malignancy.    ASSESSMENT AND PLAN: Breast cancer of lower-inner quadrant of right female breast Is a new diagnosis of right breast cancer. We will plan to do breast conservation therapy with a needle localized lumpectomy, sentinel lymph node biopsy, and radiation therapy. She has discussed radiation with Dr. Valere Dross. She will also likely get anti-estrogen therapy postoperatively.  Because of the patient's mother's breast cancer, we will have her evaluated by genetics.  The surgical procedure was described to the patient.  I discussed the incision type and location and that we would need radiology involved on the day of surgery with a wire marker and sentinel node injection.  The risks and benefits of the procedure were described to the patient and he/she wishes to proceed.    We discussed the risks bleeding,  infection, damage to other structures, need for further procedures/surgeries.  We discussed the risk of seroma.  The patient was advised if the area in the breast in cancer, we may need to go back to surgery for additional tissue to obtain negative margins or for a lymph node biopsy. The patient was  advised that these are the most common complications, but that others can occur as well.  They were advised against taking aspirin or other anti-inflammatory agents/blood thinners the week before surgery.    45 min spent in evaluation, examination, counseling, and coordination of care.  >50% spent in counseling.        Milus Height MD Surgical Oncology[facility]      Visit Diagnoses: 1. Breast cancer, right   2. Breast cancer of lower-inner quadrant of right female breast     Primary Care Physician: Larey Dresser, MD  [facility] medical oncology  [facility] radiation oncology  Bary Castilla, RN nurse navigator.

## 2013-04-22 NOTE — Anesthesia Postprocedure Evaluation (Signed)
  Anesthesia Post-op Note  Patient: [name]  Procedure(s) Performed: Procedure(s): BREAST LUMPECTOMY WITH NEEDLE LOCALIZATION AND AXILLARY SENTINEL LYMPH NODE BX (Right)  Patient Location: PACU  Anesthesia Type:General  Level of Consciousness: awake and alert   Airway and Oxygen Therapy: Patient Spontanous Breathing  Post-op Pain: mild  Post-op Assessment: Post-op Vital signs reviewed  Post-op Vital Signs: stable  Complications: No apparent anesthesia complications

## 2013-04-22 NOTE — Transfer of Care (Signed)
Immediate Anesthesia Transfer of Care Note  Patient: [name]  Procedure(s) Performed: Procedure(s): BREAST LUMPECTOMY WITH NEEDLE LOCALIZATION AND AXILLARY SENTINEL LYMPH NODE BX (Right)  Patient Location: PACU  Anesthesia Type:General  Level of Consciousness: awake and patient cooperative  Airway & Oxygen Therapy: Patient Spontanous Breathing and Patient connected to face mask oxygen  Post-op Assessment: Report given to PACU RN and Post -op Vital signs reviewed and stable  Post vital signs: Reviewed and stable  Complications: No apparent anesthesia complications

## 2013-04-22 NOTE — Op Note (Signed)
Right Breast needle localized Lumpectomy with Sentinel Node Mapping and Biopsy Procedure Note  Indications: This patient presents with history of right breast cancer with clinically negative axillary lymph node exam.  Pre-operative Diagnosis: right breast cancer  Post-operative Diagnosis: right breast cancer  Surgeon: Stark Klein   Assistants: n/a  Anesthesia: General LMA anesthesia and Local anesthesia 0.5% bupivacaine, with epinephrine  ASA Class: 3  Procedure Details  The patient was seen in the Holding Room. The risks, benefits, complications, treatment options, and expected outcomes were discussed with the patient. The possibilities of reaction to medication, pulmonary aspiration, bleeding, infection, the need for additional procedures, failure to diagnose a condition, and creating a complication requiring transfusion or operation were discussed with the patient. The patient concurred with the proposed plan, giving informed consent.  The site of surgery properly noted/marked. The patient was taken to Operating Room # 5, identified as [name] and the procedure verified as Breast Lumpectomy and Sentinel Node Biopsy. A Time Out was held and the above information confirmed.  The methylene blue was injected in the subareolar location after cleaning skin with CHG swab.    After induction of anesthesia, the right arm, breast, and chest were prepped and draped in standard fashion.  The lumpectomy was performed by creating an oblique incision over the lower inner quadrant of the breast around the previously placed localization guidewire.  The anterior margin is skin.  Dissection was carried down around the wire.    Orientation sutures were placed and the skin and pectoralis margins were inked.  Specimen radiography confirmed inclusion of the mammographic lesion.  Hemostasis was achieved with cautery.  Additional tissue was taken along the medial margin and submitted separately to pathology  after providing orientation for the pathology.  The wound was irrigated and closed with a 3-0 Vicryl deep dermal interrupted and a 4-0 Monocryl subcuticular closure in layers.   Using a hand-held gamma probe, axillary sentinel nodes were identified transcutaneously.  An oblique incision was created below the axillary hairline.  Dissection was carried through the clavipectoral fascia.  6 level 1 and 2 axillary sentinel nodes were removed and submitted to pathology.  The axillary incision was closed with a 3-0 vicryl deep dermal interrupted sutures and a 4-0 monocryl subcuticular closure in layers.      Sterile dressings were applied. At the end of the operation, all sponge, instrument, and needle counts were correct.  Findings: grossly clear surgical margins, SLN #1 blue, cps 25; SLN #2-4 palpable; SLN #5 hot, cps 220; #6 hot and blue, cps 2500.  Of note, number 4-5 were level 1 LN and 1-3 were level 2 LN, #6 was intramammary.  Skin is anterior margin.    Estimated Blood Loss:  Minimal         Drains: none         Specimens: right breast lumpectomy, SLN #1-6                Complications:  None; patient tolerated the procedure well.         Disposition: PACU - hemodynamically stable.         Condition: stable

## 2013-04-22 NOTE — Progress Notes (Signed)
nuc med injection done by radiology staff. Pt tol well, VSS (see doc flow sheet), Fentanyl given IV for comfort (per Dr. Griffin Dakin order). Family called to bedside to see pt prior to OR

## 2013-04-22 NOTE — Telephone Encounter (Signed)
Pt's daughter called to let Dr. [name] know the ondansetron requires prior authorization.  Asking if she wants to substitute something else instead.

## 2013-04-22 NOTE — Anesthesia Procedure Notes (Signed)
Procedure Name: LMA Insertion Date/Time: 04/22/2013 12:25 PM Performed by: Lyndee Leo Pre-anesthesia Checklist: Patient identified, Emergency Drugs available, Suction available and Patient being monitored Patient Re-evaluated:Patient Re-evaluated prior to inductionOxygen Delivery Method: Circle System Utilized Preoxygenation: Pre-oxygenation with 100% oxygen Intubation Type: IV induction Ventilation: Mask ventilation without difficulty LMA: LMA inserted LMA Size: 4.0 Number of attempts: 1 Airway Equipment and Method: bite block Placement Confirmation: positive ETCO2 Tube secured with: Tape Dental Injury: Teeth and Oropharynx as per pre-operative assessment

## 2013-04-22 NOTE — Interval H&P Note (Signed)
History and Physical Interval Note:  04/22/2013 11:32 AM  [name]  has presented today for surgery, with the diagnosis of RIGHT BREAST CANCER   The various methods of treatment have been discussed with the patient and family. After consideration of risks, benefits and other options for treatment, the patient has consented to  Procedure(s): right BREAST LUMPECTOMY WITH NEEDLE LOCALIZATION AND AXILLARY SENTINEL LYMPH NODE BX (Right) as a surgical intervention .  The patient's history has been reviewed, patient examined, no change in status, stable for surgery.  I have reviewed the patient's chart and labs.  Questions were answered to the patient's satisfaction.     [name]

## 2013-04-22 NOTE — Anesthesia Preprocedure Evaluation (Addendum)
Anesthesia Evaluation  Patient identified by MRN, date of birth, ID band Patient awake    Airway Mallampati: I  Neck ROM: Full    Dental  (+) Edentulous Upper   Pulmonary  breath sounds clear to auscultation        Cardiovascular hypertension, + CAD and + Peripheral Vascular Disease + dysrhythmias Rhythm:Regular Rate:Normal  Denies angina. No SOB. Does not use NTG   Neuro/Psych    GI/Hepatic   Endo/Other  diabetes, Poorly Controlled, Type 1, Insulin Dependent  Renal/GU Renal InsufficiencyRenal disease     Musculoskeletal   Abdominal   Peds  Hematology   Anesthesia Other Findings   Reproductive/Obstetrics                        Anesthesia Physical Anesthesia Plan  ASA: III  Anesthesia Plan: General   Post-op Pain Management:    Induction: Intravenous  Airway Management Planned: LMA  Additional Equipment:   Intra-op Plan:   Post-operative Plan: Extubation in OR  Informed Consent:   Dental advisory given  Plan Discussed with: CRNA and Surgeon  Anesthesia Plan Comments: (Stable to proceed)      Anesthesia Quick Evaluation

## 2013-04-22 NOTE — Discharge Instructions (Addendum)
[place] Office Phone Number [phone]  BREAST BIOPSY/ PARTIAL MASTECTOMY: POST OP INSTRUCTIONS  Always review your discharge instruction sheet given to you by the facility where your surgery was performed.  IF YOU HAVE DISABILITY OR FAMILY LEAVE FORMS, YOU MUST BRING THEM TO THE OFFICE FOR PROCESSING.  DO NOT GIVE THEM TO YOUR DOCTOR.  1. A prescription for pain medication may be given to you upon discharge.  Take your pain medication as prescribed, if needed.  If narcotic pain medicine is not needed, then you may take acetaminophen (Tylenol) or ibuprofen (Advil) as needed. 2. Take your usually prescribed medications unless otherwise directed 3. If you need a refill on your pain medication, please contact your pharmacy.  They will contact our office to request authorization.  Prescriptions will not be filled after 5pm or on week-ends. 4. You should eat very light the first 24 hours after surgery, such as soup, crackers, pudding, etc.  Resume your normal diet the day after surgery. 5. Most patients will experience some swelling and bruising in the breast.  Ice packs and a good support bra will help.  Swelling and bruising can take several days to resolve.  6. It is common to experience some constipation if taking pain medication after surgery.  Increasing fluid intake and taking a stool softener will usually help or prevent this problem from occurring.  A mild laxative (Milk of Magnesia or Miralax) should be taken according to package directions if there are no bowel movements after 48 hours. 7. Unless discharge instructions indicate otherwise, you may remove your bandages 48 hours after surgery, and you may shower at that time.  You may have steri-strips (small skin tapes) in place directly over the incision.  These strips should be left on the skin for 7-10 days.   Any sutures or staples will be removed at the office during your follow-up visit. 8. ACTIVITIES:  You may resume  regular daily activities (gradually increasing) beginning the next day.  Wearing a good support bra or sports bra (or the breast binder) minimizes pain and swelling.  You may have sexual intercourse when it is comfortable. a. You may drive when you no longer are taking prescription pain medication, you can comfortably wear a seatbelt, and you can safely maneuver your car and apply brakes. b. RETURN TO WORK:  __________1 week_______________ 9. You should see your doctor in the office for a follow-up appointment approximately two weeks after your surgery.  Your doctors nurse will typically make your follow-up appointment when she calls you with your pathology report.  Expect your pathology report 2-3 business days after your surgery.  You may call to check if you do not hear from [place] after three days.   WHEN TO CALL YOUR DOCTOR: 1. Fever over 101.0 2. Nausea and/or vomiting. 3. Extreme swelling or bruising. 4. Continued bleeding from incision. 5. Increased pain, redness, or drainage from the incision.  The clinic staff is available to answer your questions during regular business hours.  Please dont hesitate to call and ask to speak to one of the nurses for clinical concerns.  If you have a medical emergency, go to the nearest emergency room or call 911.  A surgeon from [facility] is always on call at the hospital.  For further questions, please visit [email or web address]    Post Anesthesia Home Care Instructions  Activity: Get plenty of rest for the remainder of the day. A responsible adult should stay with you for 24  hours following the procedure.  For the next 24 hours, DO NOT: -Drive a car -Paediatric nurse -Drink alcoholic beverages -Take any medication unless instructed by your physician -Make any legal decisions or sign important papers.  Meals: Start with liquid foods such as gelatin or soup. Progress to regular foods as tolerated. Avoid greasy, spicy, heavy  foods. If nausea and/or vomiting occur, drink only clear liquids until the nausea and/or vomiting subsides. Call your physician if vomiting continues.  Special Instructions/Symptoms: Your throat may feel dry or sore from the anesthesia or the breathing tube placed in your throat during surgery. If this causes discomfort, gargle with warm salt water. The discomfort should disappear within 24 hours.

## 2013-04-23 ENCOUNTER — Telehealth: Payer: Self-pay | Admitting: Dietician

## 2013-04-23 ENCOUNTER — Telehealth (INDEPENDENT_AMBULATORY_CARE_PROVIDER_SITE_OTHER): Payer: Self-pay

## 2013-04-23 ENCOUNTER — Encounter (HOSPITAL_BASED_OUTPATIENT_CLINIC_OR_DEPARTMENT_OTHER): Payer: Self-pay | Admitting: General Surgery

## 2013-04-23 NOTE — Telephone Encounter (Signed)
Daughter called stating pts blood glucose level is staying around 200 since surgery. She is doing well otherwise. I advise her to call pts PCP that manages her medications for advise on adjusting her insulin. She states she understands and will call there office.

## 2013-04-23 NOTE — Hospital Discharge Follow-Up (Signed)
Spoke with patients niece on post-op follow up call.S he states pt had a blood sugar of 500 last evening.Instructed to keep a check on CBGs and notify her primary doctor in charge of her diabetes care if blood sugars continue to stay elevated.Neice verbalized understanding.

## 2013-04-23 NOTE — Telephone Encounter (Signed)
Call from pt said that her sugars are 387 fasting today and at 12:00 381 took Novalog 3 units. Afraid she is going to go into a Diabetic Coma. Almost 500 last night after her procedure, she took no lantus yesterday, but did take 2 units Novolog with dinner and restarted her 30 units lantus this morning.  She checked her blood sugar now (5 pm) with result of 375. Spoke with Dr. Lynnae January who okayed an extra 5 units of correction insulin before dinner tonight added to her usual 2 units. Patient was told to check blood sugar 4-5 hours after(bedtime check) the  7 units before dinner and if < 110 to eat a bedtime snack and resume regular insulin schedule tomorrow if blood sugars closer to her usual. They were given the after hours contact number  And asked to call tomorrow if her blood sugar have not improved.

## 2013-04-24 ENCOUNTER — Telehealth: Payer: Self-pay | Admitting: *Deleted

## 2013-04-24 MED ORDER — INSULIN LISPRO 100 UNIT/ML ~~LOC~~ SOLN: SUBCUTANEOUS | Status: DC

## 2013-04-24 NOTE — Telephone Encounter (Signed)
I spoke to Dr. Lynnae January about her concern with the cost of her insulin and possibly changing her Novolog to Humalog. [name] Please see my note dated 04/22/13  With information from a call to her insurance company about the cost of her insulin.

## 2013-04-24 NOTE — Telephone Encounter (Signed)
Spoke with patient wants to know if she can go on a less costly Insulin.  Sander Nephew, RN 04/24/2013 11:20 AM.

## 2013-04-24 NOTE — Telephone Encounter (Signed)
Changed Novolog to Humalog at dose of 3 units lunch and dinner as per last instructions per [name] (and OK'd by me).

## 2013-04-28 ENCOUNTER — Other Ambulatory Visit: Payer: Self-pay | Admitting: *Deleted

## 2013-04-28 MED ORDER — INSULIN LISPRO 100 UNIT/ML ~~LOC~~ SOLN: SUBCUTANEOUS | Status: DC

## 2013-04-28 NOTE — Telephone Encounter (Signed)
Pharmacy sent script back they must have the diag code and insulin dependent in the body of the script before insurance will pay

## 2013-04-29 ENCOUNTER — Telehealth (INDEPENDENT_AMBULATORY_CARE_PROVIDER_SITE_OTHER): Payer: Self-pay | Admitting: General Surgery

## 2013-04-29 ENCOUNTER — Encounter: Payer: Self-pay | Admitting: Radiation Oncology

## 2013-04-29 DIAGNOSIS — C50919 Malignant neoplasm of unspecified site of unspecified female breast: Secondary | ICD-10-CM | POA: Insufficient documentation

## 2013-04-29 NOTE — Progress Notes (Addendum)
Location of Breast Cancer: right, 5 o'clock  Histology per Pathology Report:  04/22/13 Diagnosis 1. Breast, lumpectomy, Right - INVASIVE DUCTAL CARCINOMA, SEE COMMENT. - NEGATIVE FOR LYMPH VASCULAR INVASION. - INVASIVE TUMOR IS 1 MM FROM THE NEAREST MARGIN (ANTERIOR). - DUCTAL CARCINOMA IN SITU. - PREVIOUS BIOPSY SITE IDENTIFIED. - SEE TUMOR SYNOPTIC TEMPLATE BELOW. 2. Breast, excision, Right medial margin - DUCTAL CARCINOMA IN SITU SEE COMMENT - IN SITU CARCINOMA IS 1MM FROM NEAREST MEDIAL MARGIN. - CALCIFICATIONS IDENTIFIED. 3. Lymph node, sentinel, biopsy, Right axilla #1 - ONE LYMPH NODE, NEGATIVE FOR TUMOR (0/1). 4. Lymph node, sentinel, biopsy, Right axilla #2 - ONE LYMPH NODE, NEGATIVE FOR TUMOR (0/1). 5. Lymph node, sentinel, biopsy, Right axilla #3 - ONE LYMPH NODE, NEGATIVE FOR TUMOR (0/1). 6. Lymph node, sentinel, biopsy, Right axilla #4 - ONE LYMPH NODE, NEGATIVE FOR TUMOR (0/1). 7. Lymph node, sentinel, biopsy, Right axilla #5 - ONE LYMPH NODE, NEGATIVE FOR TUMOR (0/1). 8. Lymph node, sentinel, biopsy, Right axilla #6 - ONE LYMPH NODE, NEGATIVE FOR TUMOR (0/1).  Receptor Status: ER(100%), PR (12%), Her2-neu (-)  Did patient present with symptoms (if so, please note symptoms) or was this found on screening mammography?: screening mammogram  Past/Anticipated interventions by surgeon, if any: 04/22/13 right lumpectomy, 6 nodes sampled  Past/Anticipated interventions by medical oncology, if any: Chemotherapy , Dr Humphrey Rolls: will see pt on 05/05/13  Lymphedema issues, if any:   no  Pain issues, if any:  No, pt has steri strips in right axilla, denies drainage from surgical area  SAFETY ISSUES:  Prior radiation? no  Pacemaker/ICD? no  Possible current pregnancy? no  Is the patient on methotrexate? no  Current Complaints / other details:  Separated, menarche age 13, first live birth age 73, P 30, post menopausal, HRT x 6-7 yrs. Sister w/pt today. Pt works at [employer],  cares for infants.  Pt received voice mail from Dr [name] yesterday  Stating pt may need additional surgery for a close margin. Pt has FU w/Dr [name] on 05/07/13.  Andria Rhein, RN 04/29/2013,5:12 PM

## 2013-04-29 NOTE — Telephone Encounter (Signed)
Pt called to report blood on her sports bra this morning when she woke from overnight sleeping.  Reassured pt and recommended she gently wash the area with anti-bacterial soap, pat dry and recover with DSD.  Let her know that this is not uncommon and blood is usually mixed with normal tissue fluid, too.  Call back if there is a steady trickle of blood from site, otherwise she is okay.  Pt understands and will comply.

## 2013-04-29 NOTE — Telephone Encounter (Signed)
Left message

## 2013-04-30 ENCOUNTER — Ambulatory Visit
Admission: RE | Admit: 2013-04-30 | Discharge: 2013-04-30 | Disposition: A | Discharge status: Home / Self Care | Payer: Medicare Other | Source: Ambulatory Visit | Attending: Radiation Oncology | Admitting: Radiation Oncology

## 2013-04-30 ENCOUNTER — Telehealth (INDEPENDENT_AMBULATORY_CARE_PROVIDER_SITE_OTHER): Payer: Self-pay

## 2013-04-30 ENCOUNTER — Encounter: Payer: Self-pay | Admitting: Radiation Oncology

## 2013-04-30 VITALS — BP 142/68 | HR 78 | Temp 98.2°F | Resp 20 | Wt 194.0 lb

## 2013-04-30 DIAGNOSIS — C50311 Malignant neoplasm of lower-inner quadrant of right female breast: Secondary | ICD-10-CM

## 2013-04-30 DIAGNOSIS — C50919 Malignant neoplasm of unspecified site of unspecified female breast: Secondary | ICD-10-CM | POA: Insufficient documentation

## 2013-04-30 HISTORY — DX: Malignant neoplasm of unspecified site of unspecified female breast: C50.919

## 2013-04-30 NOTE — Progress Notes (Signed)
CC: Dr. Faera Byerly  Diagnosis stage I (T1b N0 M0) invasive ductal/DCIS of the right breast  History: [name] is a pleasant 67-year-old female who is seen today for review and scheduling radiation therapy in the management of her T1b N0 invasive ductal/DCIS of the right breast. At the time of a screening mammogram on 02/06/2013 she was felt to have a possible mass within the right breast. Followup mammography and ultrasound on 02/27/2013 showed a 6 x 5 mm mass at 5:00. There were no suspicious calcifications reported. There are no axillary lymph nodes. She did not have a MRI scan. She underwent a biopsy on 03/16/2013 this was diagnostic for invasive ductal carcinoma, felt to be low-grade and measured 0.5 cm from the glass slide. The tumor was ER/PR positive and HER-2/neu negative. Ki-67 was 20%. She underwent a right partial mastectomy and se sentinel lymph node biopsies on 04/22/2013. She was found to have a 0.9 cm invasive ductal carcinoma with a 1 mm margin. She also had DCIS which was 1 mm from the medial margin and 2 mm from the nearest inferior margin. She is not felt to have extensive intraductal carcinoma. 6 lymph nodes were without evidence for metastatic disease. My understanding is that Dr. Khan will offer her post radiation adjuvant antiestrogen therapy. She'll see Dr. Khan for a followup visit on February 17. She may be re-presented at the conference next Wednesday. She is without complaints today.  Physical examination: Alert and oriented. Filed Vitals:   04/30/13 1038  BP: 142/68  Pulse: 78  Temp: 98.2 F (36.8 C)  Resp: 20   Head and neck examination: Grossly unremarkable. Nodes: No palpable cervical, supraclavicular, or axillary lymphadenopathy. Her axillary wound is healing well. Chest: Lungs clear. Breasts: Her breasts are large. There is a periareolar partial mastectomy wound centered at approximately 6:00. There are overlying Steri-Strips and the wound appears to be healing  well. There is some postoperative induration at approximately 5:00. Left breast without masses or lesions.  Impression: Stage I (T1b N0 M0) invasive ductal/DCIS of the right breast. I'm reviewing her pathology today with Dr. Rund. At this time, I feel that her surgical margins for both invasive and noninvasive disease are certainly adequate. There is no need for mammography since there were no suspicious calcifications reported at the time of her diagnostic mammogram. In view of her breast size, she will need standard fractionation, and she would also benefit from a tumor bed boost. We discussed the potential acute and late toxicities of radiation therapy and consent is signed today. We can formalize her plan of care after she is presented at conference next Wednesday morning.  Plan: As discussed above. I will get her tentatively get her scheduled for simulation in late February and begin her radiation therapy in early March.  30 minutes was face-to-face the patient, primarily counseling the patient and coordinating her care.   

## 2013-04-30 NOTE — Telephone Encounter (Signed)
Pt will call back for her lab results.

## 2013-04-30 NOTE — Progress Notes (Signed)
Please see the Nurse Progress Note in the MD Initial Consult Encounter for this patient. 

## 2013-05-04 ENCOUNTER — Encounter: Payer: Self-pay | Admitting: *Deleted

## 2013-05-04 NOTE — Progress Notes (Signed)
Received order from Dr. Humphrey Rolls for Oncotype DX.  Sent requisition to pathology.  Received by Edmonia Lynch.

## 2013-05-05 ENCOUNTER — Encounter: Payer: Self-pay | Admitting: Oncology

## 2013-05-05 ENCOUNTER — Ambulatory Visit: Admission: RE | Admit: 2013-05-05 | Payer: Medicare Other | Source: Ambulatory Visit | Admitting: Radiation Oncology

## 2013-05-05 ENCOUNTER — Ambulatory Visit (HOSPITAL_BASED_OUTPATIENT_CLINIC_OR_DEPARTMENT_OTHER): Payer: Medicare Other | Admitting: Oncology

## 2013-05-05 VITALS — BP 126/76 | HR 80 | Temp 98.3°F | Resp 18 | Ht 64.0 in | Wt 195.8 lb

## 2013-05-05 DIAGNOSIS — Z170 Estrogen receptor positive status [ER+]: Secondary | ICD-10-CM

## 2013-05-05 DIAGNOSIS — C50311 Malignant neoplasm of lower-inner quadrant of right female breast: Secondary | ICD-10-CM

## 2013-05-05 DIAGNOSIS — C50319 Malignant neoplasm of lower-inner quadrant of unspecified female breast: Secondary | ICD-10-CM

## 2013-05-05 NOTE — Patient Instructions (Signed)

## 2013-05-05 NOTE — Progress Notes (Signed)
OFFICE PROGRESS NOTE  CC  [name],[name], [place] [address] Dr. Arloa Koh  Dr. Theda Sers   DIAGNOSIS: 68 year old female with new diagnosis of stage I invasive ductal carcinoma of the right breast  STAGE:  Breast cancer of lower-inner quadrant of right female breast  Primary site: Breast (Right)  Staging method: AJCC 7th Edition  Clinical: Stage IA (T1b, N0, cM0)  Summary: Stage IA (T1b, N0, cM0)   PRIOR THERAPY:  #1Patient underwent a screening mammogram on 02/06/2013.possibly showed a mass in the right breast. On 02/27/2013 she underwent a diagnostic mammogram and ultrasound that showed 6 x 5 mm mass at 5:00 position. Lymph nodes were not enlarged. She did not have an MRI. She underwent a biopsy on 03/16/2013 this revealed invasive ductal carcinoma, low grade. Tumor was ER positive PR positive HER-2/neu negative with a proliferation marker Ki-67 of 20%.  #2 s/p lumpectomy with sentinel lymph node biopsy on 05/12/13 with final pathology revealing: Grade: II of III Tumor size (gross measurement: 0.9 cm Lymph nodes: 0/6 Breast prognostic profile: Estrogen receptor: Not repeated, previous study demonstrated 100% positivity  Progesterone receptor: Not repeated, previous study demonstrated 12% positivity  Her 2 neu: Repeated, previous study demonstrated no amplification (1.79)   CURRENT THERAPY:proceed with Oncotype DX testing  INTERVAL HISTORY: [name] 68 y.o. female returns for followup visit post lumpectomy. She tolerated the surgery very well. She has minimal discomfort at the lumpectomy site. She has been seen by Dr. Arloa Koh. He has recommended adjuvant radiation therapy. Patient and I discussed Oncotype DX testing to determine breast cancer recurrence score and to rule out any need for adjuvant chemotherapy. We did discuss antiestrogen therapy adjuvantly. We discussed types of antiestrogen therapy available including  tamoxifen versus an aromatase inhibitor. This would begin after radiation therapy. 10 point review of systems was  MEDICAL HISTORY: Past Medical History  Diagnosis Date  . Hypertension     2006 B renal arteries patent. 2003 MRA - no RAS. 2003 pheo W/U Dr Hassell Done reportedly negative.  Marland Kitchen CAD (coronary artery disease) 1996    1996 - PTCA and angioplasty diagonal branch. 2000 - Rotoblator & angiopllasty of diagonal. 2006 - subendocardial AMI, DES to proximal LAD.Marland Kitchen Also had 90% stenosis in distal apical LAD. EF 55 with apical hypokinesis. Indefinite ASA and Plavix.  . Abdominal discomfort     Chronic N/V/D. Presumptive dx Crohn's dx per elevated p ANCA. Failed Entocort and Pentasa. Sep 2003 - ileocolectomy c anastomosis per Dr Deon Pilling 2/2 adhesions - path was hegative for Crohns. EGD, Sm bowel follow through (11/03), and an eteroclysis (10/03) were unrevealing. Cuases hypomag and hypocalcemia.  . Anemia     Multifactorial. Baseline HgB 10-11 ish. B12 def - 150 in 3/10. Fe Def - ferritin 35 3/10. Both are being repleted.  . Stroke     Incidental finding MRI 2002 L lacunar infarct  . Diabetes mellitus     Insulin dependent  . Allergy     Seasonal  . Chronic pain     CT 10/10 = Spinal stenosis L2 - S1.  Marland Kitchen Chronic kidney disease     Chronic renal insuff baseline Cr 1.2 - 1.4 ish.  . Adnexal mass 8/03    s/p lap BSO (R ovarian fibroma) & lysis of adhesions  . Hyperlipidemia     Managed with both a statin and Welchol. Welchol stopped 2014 2/2 cost and started on fenofibrate   . Wears dentures  top  . RBBB   . Breast cancer 03/16/13    right, 5 o'clock    ALLERGIES:  is allergic to percocet; diazepam; haloperidol lactate; lorazepam; morphine sulfate; propoxyphene hcl; and tramadol hcl.  MEDICATIONS:  Current Outpatient Prescriptions  Medication Sig Dispense Refill  . 1st Choice Lancets Thin MISC by Does not apply route 2 (two) times daily.        Marland Kitchen amLODipine (NORVASC) 10 MG tablet Take 1  tablet (10 mg total) by mouth daily.  90 tablet  3  . aspirin 81 MG tablet Take 1 tablet (81 mg total) by mouth daily.  90 tablet  3  . atenolol (TENORMIN) 50 MG tablet TAKE ONE TABLET BY MOUTH TWICE DAILY  180 tablet  3  . atorvastatin (LIPITOR) 20 MG tablet Take 1 tablet (20 mg total) by mouth at bedtime.  90 tablet  3  . clopidogrel (PLAVIX) 75 MG tablet TAKE ONE (1) TABLET EACH DAY  90 tablet  3  . cyanocobalamin (,VITAMIN B-12,) 1000 MCG/ML injection Inject 1 mL (1,000 mcg total) into the muscle every 30 (thirty) days.  10 mL  1  . desloratadine (CLARINEX) 5 MG tablet       . fenofibrate (TRICOR) 48 MG tablet Take 1 tablet (48 mg total) by mouth daily.  30 tablet  11  . ferrous sulfate 325 (65 FE) MG tablet Take 1 tablet (325 mg total) by mouth 3 (three) times daily with meals.  270 tablet  3  . furosemide (LASIX) 20 MG tablet TAKE ONE TABLET BY MOUTH TWICE DAILY  180 tablet  3  . glucose blood (ONE TOUCH ULTRA TEST) test strip 1 each by Other route 2 (two) times daily. icd 9 250.00  100 each  11  . hydrOXYzine (VISTARIL) 50 MG capsule Take 1 capsule (50 mg total) by mouth at bedtime as needed.  90 capsule  3  . insulin glargine (LANTUS) 100 UNIT/ML injection Inject 0.3 mLs (30 Units total) into the skin at bedtime.  30 mL  3  . insulin lispro (HUMALOG) 100 UNIT/ML injection Inject 3 units before lunch and dinner. DIAG. CODE 250.42. INSULIN DEPENDENT  10 mL  3  . Insulin Syringe-Needle U-100 (INSULIN SYRINGE 1CC/28G) 28G X 1/2" 1 ML MISC by Does not apply route.        Marland Kitchen lisinopril (PRINIVIL,ZESTRIL) 10 MG tablet Take 1 tablet (10 mg total) by mouth daily.  90 tablet  3  . loperamide (IMODIUM) 2 MG capsule Take 2 mg by mouth 1 day or 1 dose.      . mometasone (NASONEX) 50 MCG/ACT nasal spray Place 2 sprays into the nose daily.  51 g  3  . nitroGLYCERIN (NITROSTAT) 0.4 MG SL tablet Place 1 tablet (0.4 mg total) under the tongue every 5 (five) minutes as needed.  90 tablet  3  . ondansetron  (ZOFRAN) 4 MG tablet Take 1 tablet (4 mg total) by mouth every 8 (eight) hours as needed for nausea or vomiting.  20 tablet  2  . oxyCODONE-acetaminophen (ROXICET) 5-325 MG per tablet Take 1-2 tablets by mouth every 4 (four) hours as needed for severe pain.  30 tablet  0  . WELCHOL 625 MG tablet       . zolpidem (AMBIEN) 5 MG tablet Take 1 tablet (5 mg total) by mouth at bedtime as needed for sleep.  30 tablet  0  . zoster vaccine live, PF, (ZOSTAVAX) 60454 UNT/0.65ML injection Inject 19,400 Units into  the skin once.  1 each  0  . potassium chloride (K-DUR) 10 MEQ tablet Take 2 tablets (20 mEq total) by mouth daily.  60 tablet  11   No current facility-administered medications for this visit.    SURGICAL HISTORY:  Past Surgical History  Procedure Laterality Date  . Ptca  4/06  . Bowel resection  2003    ileocolectomy with anastomosis 2/2 adhesions  . Cholecystectomy    . Hernia repair      Ventral hernia repair  . Bilateral salpingoophorectomy  8/03    Lap BSO (R ovarian fibroma) and adhesion lysis  . Hemicolectomy      R sided hemicolectomy  . Cardiac catheterization      2 stents  . Abdominal hysterectomy    . Breast lumpectomy with needle localization and axillary sentinel lymph node bx Right 04/22/2013    Procedure: BREAST LUMPECTOMY WITH NEEDLE LOCALIZATION AND AXILLARY SENTINEL LYMPH NODE BX;  Surgeon: Stark Klein, MD;  Location: [facility];  Service: General;  Laterality: Right;    REVIEW OF SYSTEMS:  Pertinent items are noted in HPI.     PHYSICAL EXAMINATION: Blood pressure 126/76, pulse 80, temperature 98.3 F (36.8 C), temperature source Oral, resp. rate 18, height 5' 4"  (1.626 m), weight 195 lb 12.8 oz (88.814 kg). Body mass index is 33.59 kg/(m^2). ECOG PERFORMANCE STATUS: 0 - Asymptomatic   General appearance: alert, cooperative and appears stated age Lymph nodes: Cervical, supraclavicular, and axillary nodes normal. Resp: clear to auscultation  bilaterally Back: symmetric, no curvature. ROM normal. No CVA tenderness. Cardio: regular rate and rhythm GI: soft, non-tender; bowel sounds normal; no masses,  no organomegaly Extremities: extremities normal, atraumatic, no cyanosis or edema Neurologic: Grossly normal Breasts: breasts appear normal, no suspicious masses, no skin or nipple changes or axillary nodes.   LABORATORY DATA: Lab Results  Component Value Date   WBC 9.9 03/25/2013   HGB 9.5* 04/22/2013   HCT 27.6* 03/25/2013   MCV 77.1* 03/25/2013   PLT 369 03/25/2013      Chemistry      Component Value Date/Time   NA 142 03/25/2013 1236   NA 139 12/11/2011 1125   K 3.9 03/25/2013 1236   K 4.5 12/11/2011 1125   CL 107 12/11/2011 1125   CO2 24 03/25/2013 1236   CO2 23 12/11/2011 1125   BUN 22.9 03/25/2013 1236   BUN 27* 12/11/2011 1125   CREATININE 1.4* 03/25/2013 1236   CREATININE 1.16* 12/11/2011 1125   CREATININE 1.3* 12/26/2010 1440      Component Value Date/Time   CALCIUM 9.3 03/25/2013 1236   CALCIUM 9.2 12/11/2011 1125   ALKPHOS 99 03/25/2013 1236   ALKPHOS 113 05/05/2010 0928   AST 13 03/25/2013 1236   AST 17 05/05/2010 0928   ALT 13 03/25/2013 1236   ALT 18 05/05/2010 0928   BILITOT 0.24 03/25/2013 1236   BILITOT 0.5 05/05/2010 0928    ADDITIONAL INFORMATION: 1. CHROMOGENIC IN-SITU HYBRIDIZATION Results: HER-2/NEU BY CISH - NO AMPLIFICATION OF HER-2 DETECTED. RESULT RATIO OF HER2: CEP 17 SIGNALS 1.62 AVERAGE HER2 COPY NUMBER PER CELL 2.10 REFERENCE RANGE NEGATIVE HER2/Chr17 Ratio <2.0 and Average HER2 copy number <4.0 EQUIVOCAL HER2/Chr17 Ratio <2.0 and Average HER2 copy number 4.0 and <6.0 POSITIVE HER2/Chr17 Ratio >=2.0 and/or Average HER2 copy number >=6.0 Enid Cutter MD Pathologist, Electronic Signature ( Signed 04/28/2013) FINAL DIAGNOSIS Diagnosis 1. Breast, lumpectomy, Right - INVASIVE DUCTAL CARCINOMA, SEE COMMENT. - NEGATIVE FOR LYMPH VASCULAR INVASION. -  INVASIVE TUMOR IS 1 MM FROM THE NEAREST MARGIN (ANTERIOR). -  DUCTAL CARCINOMA IN SITU. - PREVIOUS BIOPSY SITE IDENTIFIED. - SEE TUMOR SYNOPTIC TEMPLATE BELOW. 2. Breast, excision, Right medial margin - DUCTAL CARCINOMA IN SITU SEE COMMENT - IN SITU CARCINOMA IS 1MM FROM NEAREST MEDIAL MARGIN. - CALCIFICATIONS IDENTIFIED. 3. Lymph node, sentinel, biopsy, Right axilla #1 1 of 4 FINAL for [name], [name] ([id number]) Diagnosis(continued) - ONE LYMPH NODE, NEGATIVE FOR TUMOR (0/1). 4. Lymph node, sentinel, biopsy, Right axilla #2 - ONE LYMPH NODE, NEGATIVE FOR TUMOR (0/1). 5. Lymph node, sentinel, biopsy, Right axilla #3 - ONE LYMPH NODE, NEGATIVE FOR TUMOR (0/1). 6. Lymph node, sentinel, biopsy, Right axilla #4 - ONE LYMPH NODE, NEGATIVE FOR TUMOR (0/1). 7. Lymph node, sentinel, biopsy, Right axilla #5 - ONE LYMPH NODE, NEGATIVE FOR TUMOR (0/1). 8. Lymph node, sentinel, biopsy, Right axilla #6 - ONE LYMPH NODE, NEGATIVE FOR TUMOR (0/1). Microscopic Comment 1. BREAST, INVASIVE TUMOR, WITH LYMPH NODE SAMPLING Specimen, including laterality and lymph node sampling (sentinel, non-sentinel): Right breast with sentinel lymph node sampling Procedure: Lumpectomy Histologic type: Ductal Grade: II of III Tubule formation: 2 Nuclear pleomorphism: 2 Mitotic: 2 Tumor size (gross measurement: 0.9 cm Margins: Invasive, distance to closest margin: 1 mm In-situ, distance to closest margin: 37m from nearest inferior margin If margin positive, focally or broadly: N/A Lymphovascular invasion: Absent Ductal carcinoma in situ: Present Grade: II of III Extensive intraductal component: Absent Lobular neoplasia: Absent Tumor focality: Unifocal Treatment effect: None If present, treatment effect in breast tissue, lymph nodes or both: N/A Extent of tumor: Skin: N/A Nipple: N/A Skeletal muscle: N/A Lymph nodes: Examined: 6 Sentinel 0 Non-sentinel 6 Total Lymph nodes with metastasis: 0 Isolated tumor cells (< 0.2 mm): N/A Micrometastasis: (> 0.2 mm  and < 2.0 mm): N/A Macrometastasis: (> 2.0 mm): N/A Extracapsular extension: N/A Breast prognostic profile: Estrogen receptor: Not repeated, previous study demonstrated 100% positivity (([id number] Progesterone receptor: Not repeated, previous study demonstrated 12% positivity (([id number] Her 2 neu: Repeated, previous study demonstrated no amplification (1.79) (([id number] 2 of 4 FINAL for Caggiano, [place](([id number] Microscopic Comment(continued) Ki-67: Not repeated, previous study demonstrated 27% proliferation rate (([id number] Non-neoplastic breast: Previous biopsy site and fibrocystic change. Calcification are present in benign ducts and lobules. TNM: pT1b, pN0, pMX Comments: The final medial margin is part 2. 2. There is no mass grossly identified. Representative tissue sections demonstrate a foci of papillary lesions with morphologic and immunophenotypic features of ductal carcinoma in situ involving papilloma (slide 2G and 2H). The remaining tissue sections demonstrate non-neoplastic findings to include fibrocystic change. Microcalcifications are present in benign lobules. Slide 2H was reviewed by Dr KLyndon Code who concurs. (CR:kh 04/24/2013)   RADIOGRAPHIC STUDIES:  Nm Sentinel Node Inj-no Rpt (breast)  04/22/2013   CLINICAL DATA: right breast cancer   Sulfur colloid was injected intradermally by the nuclear medicine  technologist for breast cancer sentinel node localization.    [place] Breast Loc Dev   1st Lesion  Inc [place]  04/22/2013   CLINICAL DATA:  Right breast cancer  EXAM: ULTRASOUND NEEDLE LOCALIZATION OF THE RIGHT BREAST  COMPARISON:  Prior studies.  FINDINGS: Patient presents for needle localization prior to lumpectomy. I met with the patient and we discussed the procedure of needle localization including benefits and alternatives. We discussed the high likelihood of a successful procedure. We discussed the risks of the procedure, including infection, bleeding,  tissue injury, and further surgery. Informed, written consent was given.  The usual time-out protocol was performed immediately prior to the procedure.  Using ultrasound guidance, sterile technique, 2% lidocaine, and a 5 cm modified Kopans needle, the mass at 5 o'clock was localized using a caudocranial approach. Films were labeled and sent with the patient to surgery. She tolerated the procedure well.  Specimen radiograph is performed at [place] and confirms the clip, mass, and wire to be present in the tissue sample. The specimen is marked for pathology.  IMPRESSION: Needle localization right breast.  No apparent complications.   Electronically Signed   By: Ulyess Blossom M.D.   On: 04/22/2013 15:39    ASSESSMENT/PLAN: 68 year old female with  #1 stage I (T1 N0) invasive ductal carcinoma of the right breast status post lumpectomy performed on 04/22/2013. With the final pathology revealing 0.9 cm invasive ductal carcinoma grade 2, estrogen receptor positive progesterone receptor positive HER-2/neu negative with a proliferation marker Ki-67 27%. Postoperatively she is doing well.  #2 patient recommended Oncotype DX testing to determine breast cancer recurrence score and the need for adjuvant chemotherapy. This will be sent off today and will return in about 2-3 weeks' time. If patient has a low-risk recurrence score then she will proceed with adjuvant radiation therapy first followed by adjuvant antiestrogen therapy consisting of Arimidex or Aromasin. If there breast cancer recurrence score is in the intermediate or high risk then she will need chemotherapy.  #3 patient will be seen back after results of her Oncotype DX. I have sent a message to Dr. Arloa Koh regarding holding radiation therapy until we have results of the Oncotype.   All questions were answered. The patient knows to call the clinic with any problems, questions or concerns. We can certainly see the patient  much sooner if necessary.  I spent 20 minutes counseling the patient face to face. The total time spent in the appointment was 25 minutes.    Marcy Panning, MD Medical/Oncology [facility] [phone] (beeper) [phone] (Office)  05/05/2013, 11:28 AM

## 2013-05-06 ENCOUNTER — Ambulatory Visit: Payer: Medicare Other | Admitting: Radiation Oncology

## 2013-05-06 ENCOUNTER — Ambulatory Visit: Payer: Medicare Other

## 2013-05-07 ENCOUNTER — Ambulatory Visit: Payer: Medicare Other | Admitting: Radiation Oncology

## 2013-05-07 ENCOUNTER — Ambulatory Visit (INDEPENDENT_AMBULATORY_CARE_PROVIDER_SITE_OTHER): Payer: Medicare Other | Admitting: General Surgery

## 2013-05-07 ENCOUNTER — Encounter (INDEPENDENT_AMBULATORY_CARE_PROVIDER_SITE_OTHER): Payer: Self-pay | Admitting: General Surgery

## 2013-05-07 ENCOUNTER — Ambulatory Visit: Payer: Medicare Other

## 2013-05-07 VITALS — BP 124/70 | HR 80 | Temp 97.7°F | Resp 14 | Ht 64.0 in | Wt 194.0 lb

## 2013-05-07 DIAGNOSIS — C50311 Malignant neoplasm of lower-inner quadrant of right female breast: Secondary | ICD-10-CM

## 2013-05-07 DIAGNOSIS — C50319 Malignant neoplasm of lower-inner quadrant of unspecified female breast: Secondary | ICD-10-CM

## 2013-05-07 NOTE — Assessment & Plan Note (Signed)
No evidence of surgical complications. Pt getting oncotype.   If pt to get chemotherapy, can get port a cath scheduled.   Otherwise, follow up in 3 months

## 2013-05-07 NOTE — Patient Instructions (Signed)
Follow up in 3 months.  Call earlier for concerns.  If Dr. Humphrey Rolls recommends chemotherapy, she will send me a message to schedule port a cath placement.    Here is some information below in case we need it.      Implanted Port Insertion An implanted port is a central line that has a round shape and is placed under the skin. It is used as a long-term IV access for:   Medicines, such as chemotherapy.   Fluids.   Liquid nutrition, such as total parenteral nutrition (TPN).   Blood samples.  LET [facility] CARE PROVIDER KNOW ABOUT:  Allergies to food or medicine.   Medicines taken, including vitamins, herbs, eye drops, creams, and over-the-counter medicines.   Any allergies to heparin.  Use of steroids (by mouth or creams).   Previous problems with anesthetics or numbing medicines.   History of bleeding problems or blood clots.   Previous surgery.   Other health problems, including diabetes and kidney problems.   Possibility of pregnancy, if this applies. RISKS AND COMPLICATIONS  Damage to the blood vessel, bruising, or bleeding at the puncture site.   Infection.  Blood clot in the vessel that the port is in.  Breakdown of the skin over your port.  Very rarely a person may develop a condition called a pneumothorax, a collection of air in the chest that may cause one of the lungs to collapse. The placement of these catheters with the appropriate imaging guidance significantly decreases the risk of a pneumothorax.  BEFORE THE PROCEDURE   Your health care provider may want you to have blood tests. These tests can help tell how well your kidneys and liver are working. They can also show how well your blood clots.   If you take blood thinners (anticoagulant medicines), ask your health care provider when you should stop taking them.   Make arrangements for someone to drive you home. This is necessary if you have been sedated for your procedure.  PROCEDURE    Port insertion usually takes about 30 45 minutes.   An IV needle will be inserted in your arm. Medicine for pain and medicine to help relax you (sedative) will flow directly into your body through this needle.   You will lie on an exam table, and you will be connected to monitors to keep track of your heart rate, blood pressure, and breathing throughout the procedure.  An oxygen monitoring device may be attached to your finger. Oxygen will be given.   Everything is kept as germ free (sterile) as possible during the procedure. The skin near the point of the incision will be cleaned with antiseptic, and the area will be draped with sterile towels. The skin and deeper tissues over the port area will be made numb with a local anesthetic.  Two small cuts (incisions) will be made in the skin to insert the port. One will be made in the neck to obtain access to the vein where the catheter will lie.   Because the port reservoir is placed under the skin, a small skin incision is made in the upper chest, and a small pocket for the port is made under the skin. The catheter connected to the port tunnels to a large central vein in the chest. A small, raised area remains on your body at the site of the reservoir when the procedure is complete.  The port placement is done under imaging guidance to ensure the proper placement.  The  reservoir has a silicone covering that can be punctured with a special needle.   The port is flushed with normal saline, and blood is drawn to make sure it is working properly.  There is nothing remaining outside the skin when the procedure is finished.   Incisions are held together by stitches, surgical glue, or a special tape.  AFTER THE PROCEDURE  You will stay in a recovery area until the anesthesia has worn off. Your blood pressure and pulse will be checked.  A final chest X-ray is taken to check the placement of the port and that there is no injury to your  lung.  If there are no problems, you should be able to go home after the procedure.  Document Released: 12/24/2012 Document Reviewed: 11/10/2012 [facility] Patient Information 2014 [place], [place].

## 2013-05-07 NOTE — Progress Notes (Signed)
HISTORY: Pt is around 2 weeks s/p right BCT for right breast cancer.  She did well post op with minimal pain, and minimal soreness.  She denies fever/ chills.  She is otherwise well.  She has seen Dr. Humphrey Rolls and Dr. Valere Dross regarding adjuvant treatment.      EXAM: General:  Alert and oriented.   Incision:  Healing well.  Minimal seroma.     PATHOLOGY: Diagnosis 1. Breast, lumpectomy, Right - INVASIVE DUCTAL CARCINOMA, SEE COMMENT. - NEGATIVE FOR LYMPH VASCULAR INVASION. - INVASIVE TUMOR IS 1 MM FROM THE NEAREST MARGIN (ANTERIOR). - DUCTAL CARCINOMA IN SITU. - PREVIOUS BIOPSY SITE IDENTIFIED. - SEE TUMOR SYNOPTIC TEMPLATE BELOW. 2. Breast, excision, Right medial margin - DUCTAL CARCINOMA IN SITU SEE COMMENT - IN SITU CARCINOMA IS 1MM FROM NEAREST MEDIAL MARGIN. - CALCIFICATIONS IDENTIFIED. 3. Lymph node, sentinel, biopsy, Right axilla #1 - ONE LYMPH NODE, NEGATIVE FOR TUMOR (0/1). 4. Lymph node, sentinel, biopsy, Right axilla #2 - ONE LYMPH NODE, NEGATIVE FOR TUMOR (0/1). 5. Lymph node, sentinel, biopsy, Right axilla #3 - ONE LYMPH NODE, NEGATIVE FOR TUMOR (0/1). 6. Lymph node, sentinel, biopsy, Right axilla #4 - ONE LYMPH NODE, NEGATIVE FOR TUMOR (0/1). 7. Lymph node, sentinel, biopsy, Right axilla #5 - ONE LYMPH NODE, NEGATIVE FOR TUMOR (0/1). 8. Lymph node, sentinel, biopsy, Right axilla #6 - ONE LYMPH NODE, NEGATIVE FOR TUMOR (0/1).   ASSESSMENT AND PLAN:   Breast cancer of lower-inner quadrant of right female breast No evidence of surgical complications. Pt getting oncotype.   If pt to get chemotherapy, can get port a cath scheduled.   Otherwise, follow up in 3 months      Milus Height, MD Surgical Oncology, [facility]  Larey Dresser, MD Bartholomew Crews, MD

## 2013-05-12 ENCOUNTER — Ambulatory Visit: Payer: Medicare Other | Admitting: Radiation Oncology

## 2013-05-14 ENCOUNTER — Telehealth: Payer: Self-pay | Admitting: *Deleted

## 2013-05-14 NOTE — Telephone Encounter (Signed)
Received fax from [facility] w/ [place] requesting a PAC to be filled out and signed and faxed back.  Placed paperwork in Dawn's box to complete.

## 2013-05-18 ENCOUNTER — Encounter (HOSPITAL_COMMUNITY): Payer: Self-pay

## 2013-05-18 ENCOUNTER — Encounter: Payer: Self-pay | Admitting: *Deleted

## 2013-05-18 ENCOUNTER — Telehealth: Payer: Self-pay | Admitting: *Deleted

## 2013-05-18 NOTE — Progress Notes (Signed)
Received Oncotype Dx results of 19.  Gave copy to MD.  Evelina Bucy copy to Med Rec to scan.

## 2013-05-18 NOTE — Telephone Encounter (Signed)
Faxed PAC & Office notes to [place] at [facility].  Took PAC to HIM to scan.

## 2013-05-21 ENCOUNTER — Telehealth: Payer: Self-pay | Admitting: Oncology

## 2013-05-21 ENCOUNTER — Encounter: Payer: Self-pay | Admitting: Oncology

## 2013-05-21 ENCOUNTER — Ambulatory Visit (HOSPITAL_BASED_OUTPATIENT_CLINIC_OR_DEPARTMENT_OTHER): Payer: Medicare Other | Admitting: Oncology

## 2013-05-21 VITALS — BP 130/68 | HR 76 | Temp 98.7°F | Resp 18 | Ht 64.0 in | Wt 196.6 lb

## 2013-05-21 DIAGNOSIS — C50319 Malignant neoplasm of lower-inner quadrant of unspecified female breast: Secondary | ICD-10-CM

## 2013-05-21 DIAGNOSIS — C50311 Malignant neoplasm of lower-inner quadrant of right female breast: Secondary | ICD-10-CM

## 2013-05-21 DIAGNOSIS — Z170 Estrogen receptor positive status [ER+]: Secondary | ICD-10-CM

## 2013-05-21 NOTE — Telephone Encounter (Signed)
, °

## 2013-05-21 NOTE — Patient Instructions (Signed)
arimidex  aromasin  Tamoxifen  Letrozole (femara)

## 2013-05-21 NOTE — Progress Notes (Signed)
OFFICE PROGRESS NOTE  CC  [name],[name], MD [place] [address] Dr. Arloa [name]  Dr. Theda [name]   DIAGNOSIS: 68 year old female with new diagnosis of stage I invasive ductal carcinoma of the right breast  STAGE:  Breast cancer of lower-inner quadrant of right female breast  Primary site: Breast (Right)  Staging method: AJCC 7th Edition  Clinical: Stage IA (T1b, N0, cM0)  Summary: Stage IA (T1b, N0, cM0)   PRIOR THERAPY:  #1Patient underwent a screening mammogram on 02/06/2013.possibly showed a mass in the right breast. On 02/27/2013 she underwent a diagnostic mammogram and ultrasound that showed 6 x 5 mm mass at 5:00 position. Lymph nodes were not enlarged. She did not have an MRI. She underwent a biopsy on 03/16/2013 this revealed invasive ductal carcinoma, low grade. Tumor was ER positive PR positive HER-2/neu negative with a proliferation marker Ki-67 of 20%.  #2 s/p lumpectomy with sentinel lymph node biopsy on 05/12/13 with final pathology revealing: Grade: II of III Tumor size (gross measurement: 0.9 cm Lymph nodes: 0/6 Breast prognostic profile: Estrogen receptor: Not repeated, previous study demonstrated 100% positivity  Progesterone receptor: Not repeated, previous study demonstrated 12% positivity  Her 2 neu: Repeated, previous study demonstrated no amplification (1.79)   #3 Oncotype Dx testing score: Her Oncotype score was 19 giving her a 12% risk of distant recurrence with tamoxifen only for 5 years. This is in the low intermediate risk category. I discussed this with the patient today. She does not want chemotherapy.   CURRENT THERAPY:  refer to radiation oncology for adjuvant radiation.  INTERVAL HISTORY: [name] 68 y.o. female returns for followup visit to discuss her Oncotype DX test results. I went over the results with her in detail. Her breast cancer recurrence score was 19. Giving her a 12% risk of distant recurrence with  tamoxifen only for 5 years. Patient and I discussed the implications of this. Her results declined in the intermediate risk category. Upon further detailed review I do think that if she took chemotherapy her additional benefit would be about 3-4%. However the patient feels that the risks of chemotherapy were far outweigh the benefits. She therefore is declining to treatment.Today she denies any headaches double vision blurring of vision fevers chills night sweats. No shortness of breath chest pains palpitations. No abdominal pain no diarrhea or constipation. She has no easy bruising or bleeding. She has no myalgias and arthralgias. No peripheral paresthesias or gait disturbances. Her surgical scar has healed very nicely. Remainder of the 10 point review of systems is negative.   MEDICAL HISTORY: Past Medical History  Diagnosis Date  . Hypertension     2006 B renal arteries patent. 2003 MRA - no RAS. 2003 pheo W/U Dr [name] reportedly negative.  Marland Kitchen CAD (coronary artery disease) 1996    1996 - PTCA and angioplasty diagonal branch. 2000 - Rotoblator & angiopllasty of diagonal. 2006 - subendocardial AMI, DES to proximal LAD.Marland Kitchen Also had 90% stenosis in distal apical LAD. EF 55 with apical hypokinesis. Indefinite ASA and Plavix.  . Abdominal discomfort     Chronic N/V/D. Presumptive dx Crohn's dx per elevated p ANCA. Failed Entocort and Pentasa. Sep 2003 - ileocolectomy c anastomosis per Dr [name] 2/2 adhesions - path was hegative for Crohns. EGD, Sm bowel follow through (11/03), and an eteroclysis (10/03) were unrevealing. Cuases hypomag and hypocalcemia.  . Anemia     Multifactorial. Baseline HgB 10-11 ish. B12 def - 150 in 3/10. Fe Def -  ferritin 35 3/10. Both are being repleted.  . Stroke     Incidental finding MRI 2002 L lacunar infarct  . Diabetes mellitus     Insulin dependent  . Allergy     Seasonal  . Chronic pain     CT 10/10 = Spinal stenosis L2 - S1.  Marland Kitchen Chronic kidney disease     Chronic  renal insuff baseline Cr 1.2 - 1.4 ish.  . Adnexal mass 8/03    s/p lap BSO (R ovarian fibroma) & lysis of adhesions  . Hyperlipidemia     Managed with both a statin and Welchol. Welchol stopped 2014 2/2 cost and started on fenofibrate   . Wears dentures     top  . RBBB   . Breast cancer 03/16/13    right, 5 o'clock    ALLERGIES:  is allergic to percocet; diazepam; haloperidol lactate; lorazepam; morphine sulfate; propoxyphene hcl; and tramadol hcl.  MEDICATIONS:  Current Outpatient Prescriptions  Medication Sig Dispense Refill  . 1st Choice Lancets Thin MISC by Does not apply route 2 (two) times daily.        Marland Kitchen amLODipine (NORVASC) 10 MG tablet Take 1 tablet (10 mg total) by mouth daily.  90 tablet  3  . aspirin 81 MG tablet Take 1 tablet (81 mg total) by mouth daily.  90 tablet  3  . atenolol (TENORMIN) 50 MG tablet TAKE ONE TABLET BY MOUTH TWICE DAILY  180 tablet  3  . atorvastatin (LIPITOR) 20 MG tablet Take 1 tablet (20 mg total) by mouth at bedtime.  90 tablet  3  . clopidogrel (PLAVIX) 75 MG tablet TAKE ONE (1) TABLET EACH DAY  90 tablet  3  . cyanocobalamin (,VITAMIN B-12,) 1000 MCG/ML injection Inject 1 mL (1,000 mcg total) into the muscle every 30 (thirty) days.  10 mL  1  . desloratadine (CLARINEX) 5 MG tablet       . fenofibrate (TRICOR) 48 MG tablet Take 1 tablet (48 mg total) by mouth daily.  30 tablet  11  . ferrous sulfate 325 (65 FE) MG tablet Take 1 tablet (325 mg total) by mouth 3 (three) times daily with meals.  270 tablet  3  . furosemide (LASIX) 20 MG tablet TAKE ONE TABLET BY MOUTH TWICE DAILY  180 tablet  3  . glucose blood (ONE TOUCH ULTRA TEST) test strip 1 each by Other route 2 (two) times daily. icd 9 250.00  100 each  11  . hydrOXYzine (VISTARIL) 50 MG capsule Take 1 capsule (50 mg total) by mouth at bedtime as needed.  90 capsule  3  . insulin glargine (LANTUS) 100 UNIT/ML injection Inject 0.3 mLs (30 Units total) into the skin at bedtime.  30 mL  3  .  insulin lispro (HUMALOG) 100 UNIT/ML injection Inject 3 units before lunch and dinner. DIAG. CODE 250.42. INSULIN DEPENDENT  10 mL  3  . Insulin Syringe-Needle U-100 (INSULIN SYRINGE 1CC/28G) 28G X 1/2" 1 ML MISC by Does not apply route.        Marland Kitchen lisinopril (PRINIVIL,ZESTRIL) 10 MG tablet Take 1 tablet (10 mg total) by mouth daily.  90 tablet  3  . loperamide (IMODIUM) 2 MG capsule Take 2 mg by mouth 1 day or 1 dose.      . mometasone (NASONEX) 50 MCG/ACT nasal spray Place 2 sprays into the nose daily.  51 g  3  . nitroGLYCERIN (NITROSTAT) 0.4 MG SL tablet Place 1 tablet (0.4 mg  total) under the tongue every 5 (five) minutes as needed.  90 tablet  3  . ondansetron (ZOFRAN) 4 MG tablet Take 1 tablet (4 mg total) by mouth every 8 (eight) hours as needed for nausea or vomiting.  20 tablet  2  . oxyCODONE-acetaminophen (ROXICET) 5-325 MG per tablet Take 1-2 tablets by mouth every 4 (four) hours as needed for severe pain.  30 tablet  0  . WELCHOL 625 MG tablet       . zolpidem (AMBIEN) 5 MG tablet Take 1 tablet (5 mg total) by mouth at bedtime as needed for sleep.  30 tablet  0  . zoster vaccine live, PF, (ZOSTAVAX) 09470 UNT/0.65ML injection Inject 19,400 Units into the skin once.  1 each  0  . potassium chloride (K-DUR) 10 MEQ tablet Take 2 tablets (20 mEq total) by mouth daily.  60 tablet  11   No current facility-administered medications for this visit.    SURGICAL HISTORY:  Past Surgical History  Procedure Laterality Date  . Ptca  4/06  . Bowel resection  2003    ileocolectomy with anastomosis 2/2 adhesions  . Cholecystectomy    . Hernia repair      Ventral hernia repair  . Bilateral salpingoophorectomy  8/03    Lap BSO (R ovarian fibroma) and adhesion lysis  . Hemicolectomy      R sided hemicolectomy  . Cardiac catheterization      2 stents  . Abdominal hysterectomy    . Breast lumpectomy with needle localization and axillary sentinel lymph node bx Right 04/22/2013    Procedure:  BREAST LUMPECTOMY WITH NEEDLE LOCALIZATION AND AXILLARY SENTINEL LYMPH NODE BX;  Surgeon: Stark Klein, MD;  Location: [place] [facility];  Service: General;  Laterality: Right;    REVIEW OF SYSTEMS:  Pertinent items are noted in HPI.     PHYSICAL EXAMINATION: Blood pressure 130/68, pulse 76, temperature 98.7 F (37.1 C), temperature source Oral, resp. rate 18, height 5' 4"  (1.626 m), weight 196 lb 9.6 oz (89.177 kg). Body mass index is 33.73 kg/(m^2). ECOG PERFORMANCE STATUS: 0 - Asymptomatic   General appearance: alert, cooperative and appears stated age Lymph nodes: Cervical, supraclavicular, and axillary nodes normal. Resp: clear to auscultation bilaterally Back: symmetric, no curvature. ROM normal. No CVA tenderness. Cardio: regular rate and rhythm GI: soft, non-tender; bowel sounds normal; no masses,  no organomegaly Extremities: extremities normal, atraumatic, no cyanosis or edema Neurologic: Grossly normal Breasts: breasts appear normal, no suspicious masses, no skin or nipple changes or axillary nodes.   LABORATORY DATA: Lab Results  Component Value Date   WBC 9.9 03/25/2013   HGB 9.5* 04/22/2013   HCT 27.6* 03/25/2013   MCV 77.1* 03/25/2013   PLT 369 03/25/2013      Chemistry      Component Value Date/Time   NA 142 03/25/2013 1236   NA 139 12/11/2011 1125   K 3.9 03/25/2013 1236   K 4.5 12/11/2011 1125   CL 107 12/11/2011 1125   CO2 24 03/25/2013 1236   CO2 23 12/11/2011 1125   BUN 22.9 03/25/2013 1236   BUN 27* 12/11/2011 1125   CREATININE 1.4* 03/25/2013 1236   CREATININE 1.16* 12/11/2011 1125   CREATININE 1.3* 12/26/2010 1440      Component Value Date/Time   CALCIUM 9.3 03/25/2013 1236   CALCIUM 9.2 12/11/2011 1125   ALKPHOS 99 03/25/2013 1236   ALKPHOS 113 05/05/2010 0928   AST 13 03/25/2013 1236   AST 17  05/05/2010 0928   ALT 13 03/25/2013 1236   ALT 18 05/05/2010 0928   BILITOT 0.24 03/25/2013 1236   BILITOT 0.5 05/05/2010 0928    ADDITIONAL INFORMATION: 1. CHROMOGENIC  IN-SITU HYBRIDIZATION Results: HER-2/NEU BY CISH - NO AMPLIFICATION OF HER-2 DETECTED. RESULT RATIO OF HER2: CEP 17 SIGNALS 1.62 AVERAGE HER2 COPY NUMBER PER CELL 2.10 REFERENCE RANGE NEGATIVE HER2/Chr17 Ratio <2.0 and Average HER2 copy number <4.0 EQUIVOCAL HER2/Chr17 Ratio <2.0 and Average HER2 copy number 4.0 and <6.0 POSITIVE HER2/Chr17 Ratio >=2.0 and/or Average HER2 copy number >=6.0 Enid Cutter MD Pathologist, Electronic Signature ( Signed 04/28/2013) FINAL DIAGNOSIS Diagnosis 1. Breast, lumpectomy, Right - INVASIVE DUCTAL CARCINOMA, SEE COMMENT. - NEGATIVE FOR LYMPH VASCULAR INVASION. - INVASIVE TUMOR IS 1 MM FROM THE NEAREST MARGIN (ANTERIOR). - DUCTAL CARCINOMA IN SITU. - PREVIOUS BIOPSY SITE IDENTIFIED. - SEE TUMOR SYNOPTIC TEMPLATE BELOW. 2. Breast, excision, Right medial margin - DUCTAL CARCINOMA IN SITU SEE COMMENT - IN SITU CARCINOMA IS 1MM FROM NEAREST MEDIAL MARGIN. - CALCIFICATIONS IDENTIFIED. 3. Lymph node, sentinel, biopsy, Right axilla #1 1 of 4 FINAL for Luczynski, Murielle [name] ([id number]) Diagnosis(continued) - ONE LYMPH NODE, NEGATIVE FOR TUMOR (0/1). 4. Lymph node, sentinel, biopsy, Right axilla #2 - ONE LYMPH NODE, NEGATIVE FOR TUMOR (0/1). 5. Lymph node, sentinel, biopsy, Right axilla #3 - ONE LYMPH NODE, NEGATIVE FOR TUMOR (0/1). 6. Lymph node, sentinel, biopsy, Right axilla #4 - ONE LYMPH NODE, NEGATIVE FOR TUMOR (0/1). 7. Lymph node, sentinel, biopsy, Right axilla #5 - ONE LYMPH NODE, NEGATIVE FOR TUMOR (0/1). 8. Lymph node, sentinel, biopsy, Right axilla #6 - ONE LYMPH NODE, NEGATIVE FOR TUMOR (0/1). Microscopic Comment 1. BREAST, INVASIVE TUMOR, WITH LYMPH NODE SAMPLING Specimen, including laterality and lymph node sampling (sentinel, non-sentinel): Right breast with sentinel lymph node sampling Procedure: Lumpectomy Histologic type: Ductal Grade: II of III Tubule formation: 2 Nuclear pleomorphism: 2 Mitotic: 2 Tumor size (gross  measurement: 0.9 cm Margins: Invasive, distance to closest margin: 1 mm In-situ, distance to closest margin: 34m from nearest inferior margin If margin positive, focally or broadly: N/A Lymphovascular invasion: Absent Ductal carcinoma in situ: Present Grade: II of III Extensive intraductal component: Absent Lobular neoplasia: Absent Tumor focality: Unifocal Treatment effect: None If present, treatment effect in breast tissue, lymph nodes or both: N/A Extent of tumor: Skin: N/A Nipple: N/A Skeletal muscle: N/A Lymph nodes: Examined: 6 Sentinel 0 Non-sentinel 6 Total Lymph nodes with metastasis: 0 Isolated tumor cells (< 0.2 mm): N/A Micrometastasis: (> 0.2 mm and < 2.0 mm): N/A Macrometastasis: (> 2.0 mm): N/A Extracapsular extension: N/A Breast prognostic profile: Estrogen receptor: Not repeated, previous study demonstrated 100% positivity (([id number] Progesterone receptor: Not repeated, previous study demonstrated 12% positivity (([id number] Her 2 neu: Repeated, previous study demonstrated no amplification (1.79) (([id number] 2 of 4 FINAL for Torosian, Kamyiah JMartinique(([id number] Microscopic Comment(continued) Ki-67: Not repeated, previous study demonstrated 27% proliferation rate (([id number] Non-neoplastic breast: Previous biopsy site and fibrocystic change. Calcification are present in benign ducts and lobules. TNM: pT1b, pN0, pMX Comments: The final medial margin is part 2. 2. There is no mass grossly identified. Representative tissue sections demonstrate a foci of papillary lesions with morphologic and immunophenotypic features of ductal carcinoma in situ involving papilloma (slide 2G and 2H). The remaining tissue sections demonstrate non-neoplastic findings to include fibrocystic change. Microcalcifications are present in benign lobules. Slide 2H was reviewed by Dr KLyndon Code who concurs. (CR:kh 04/24/2013)   RADIOGRAPHIC STUDIES:  Nm Sentinel Node Inj-no Rpt  (breast)  04/22/2013  CLINICAL DATA: right breast cancer   Sulfur colloid was injected intradermally by the nuclear medicine  technologist for breast cancer sentinel node localization.    [place] Rt Plc Breast Loc Dev   1st Lesion  Inc US Guide  04/22/2013   CLINICAL DATA:  Right breast cancer  EXAM: ULTRASOUND NEEDLE LOCALIZATION OF THE RIGHT BREAST  COMPARISON:  Prior studies.  FINDINGS: Patient presents for needle localization prior to lumpectomy. I met with the patient and we discussed the procedure of needle localization including benefits and alternatives. We discussed the high likelihood of a successful procedure. We discussed the risks of the procedure, including infection, bleeding, tissue injury, and further surgery. Informed, written consent was given.  The usual time-out protocol was performed immediately prior to the procedure.  Using ultrasound guidance, sterile technique, 2% lidocaine, and a 5 cm modified Kopans needle, the mass at 5 o'clock was localized using a caudocranial approach. Films were labeled and sent with the patient to surgery. She tolerated the procedure well.  Specimen radiograph is performed at [facility] and confirms the clip, mass, and wire to be present in the tissue sample. The specimen is marked for pathology.  IMPRESSION: Needle localization right breast.  No apparent complications.   Electronically Signed   By: Ulyess Blossom M.D.   On: 04/22/2013 15:39    ASSESSMENT/PLAN: 68 year old female with  #1 stage I (T1 N0) invasive ductal carcinoma of the right breast status post lumpectomy performed on 04/22/2013. With the final pathology revealing 0.9 cm invasive ductal carcinoma grade 2, estrogen receptor positive progesterone receptor positive HER-2/neu negative with a proliferation marker Ki-67 27%. Postoperatively she is doing well.  #2  Oncotype DX testing  Breast cancer recurrence score 19 with a 12% risk of distant recurrence with tamoxifen. Her  score was in the intermediate risk category. We discussed chemotherapy. She and family declined chemotherapy.  #3 patient will proceed with adjuvant radiation therapy first. Once she completes the RT we will begin her on anti-estrogen consisting of an aromatase inhibitor (arimidex) we discussed the risks and benefits of these agents in detail.  #4. Follow up: she will see me back in 2 months for follow up and to begin adjuvant anti-estrogen.  All questions were answered. The patient knows to call the clinic with any problems, questions or concerns. We can certainly see the patient much sooner if necessary.  I spent 20 minutes counseling the patient face to face. The total time spent in the appointment was 25 minutes.    Marcy Panning, MD Medical/Oncology [facility] [phone] (beeper) [phone] (Office)  05/21/2013, 2:40 PM

## 2013-05-25 ENCOUNTER — Ambulatory Visit
Admission: RE | Admit: 2013-05-25 | Discharge: 2013-05-25 | Disposition: A | Discharge status: Home / Self Care | Payer: Medicare Other | Source: Ambulatory Visit | Attending: Radiation Oncology | Admitting: Radiation Oncology

## 2013-05-25 DIAGNOSIS — L988 Other specified disorders of the skin and subcutaneous tissue: Secondary | ICD-10-CM | POA: Insufficient documentation

## 2013-05-25 DIAGNOSIS — C50311 Malignant neoplasm of lower-inner quadrant of right female breast: Secondary | ICD-10-CM

## 2013-05-25 DIAGNOSIS — Y842 Radiological procedure and radiotherapy as the cause of abnormal reaction of the patient, or of later complication, without mention of misadventure at the time of the procedure: Secondary | ICD-10-CM | POA: Insufficient documentation

## 2013-05-25 DIAGNOSIS — Z510 Encounter for antineoplastic radiation therapy: Secondary | ICD-10-CM | POA: Insufficient documentation

## 2013-05-25 DIAGNOSIS — C50319 Malignant neoplasm of lower-inner quadrant of unspecified female breast: Secondary | ICD-10-CM | POA: Insufficient documentation

## 2013-05-25 NOTE — Progress Notes (Signed)
Complex simulation/treatment planning note: The patient was taken to the CT simulator. She was placed on a custom breast board and she had construction of a VAC LOC immobilization device. Her right breast field borders were marked with radiopaque wires. Her right partial stiffness: Is also marked. She was then scanned. Her normal anatomy including her heart and lungs were contoured in addition to her tumor bed along the lower inner quadrant of the right breast. I prescribing 4500 cGy 25 sessions to be followed by a right breast boost for a further 1600 cGy 8 sessions. She is now ready for 3-D simulation. I am also requesting optical guidance for her daily treatment.

## 2013-05-27 ENCOUNTER — Encounter: Payer: Self-pay | Admitting: Radiation Oncology

## 2013-05-27 NOTE — Progress Notes (Signed)
3-D simulation note: The patient underwent 3-D simulation for tangential field treatment to her right breast. She is setup to medial and right breast tangents with field in field compensation with 2 sets of multileaf collimators for each tangent for a total of for complex treatment devices. Dose volume histograms were obtained for the target structures/tumor bed in addition to the lungs, heart and skin. I'm prescribing 4500 cGy in 25 sessions utilizing mixed 10 MV, 6 MV, and 15 MV photons.

## 2013-05-28 ENCOUNTER — Encounter: Payer: Self-pay | Admitting: Genetic Counselor

## 2013-05-28 ENCOUNTER — Other Ambulatory Visit: Payer: Medicare Other

## 2013-05-28 ENCOUNTER — Ambulatory Visit (HOSPITAL_BASED_OUTPATIENT_CLINIC_OR_DEPARTMENT_OTHER): Payer: Medicare Other | Admitting: Genetic Counselor

## 2013-05-28 DIAGNOSIS — C50311 Malignant neoplasm of lower-inner quadrant of right female breast: Secondary | ICD-10-CM

## 2013-05-28 DIAGNOSIS — Z803 Family history of malignant neoplasm of breast: Secondary | ICD-10-CM

## 2013-05-28 DIAGNOSIS — C50319 Malignant neoplasm of lower-inner quadrant of unspecified female breast: Secondary | ICD-10-CM

## 2013-05-28 DIAGNOSIS — IMO0002 Reserved for concepts with insufficient information to code with codable children: Secondary | ICD-10-CM

## 2013-05-28 DIAGNOSIS — Z800 Family history of malignant neoplasm of digestive organs: Secondary | ICD-10-CM

## 2013-05-28 NOTE — Progress Notes (Signed)
Dr.  Marcy Panning requested a consultation for genetic counseling and risk assessment for [name], a 68 y.o. female, for discussion of her personal history of breast cancer and family history of breast, pancreatic, and prostate cancer.  She presents to clinic today to discuss the possibility of a genetic predisposition to cancer, and to further clarify her risks, as well as her family members' risks for cancer.   HISTORY OF PRESENT ILLNESS: In 2015, at the age of 46, [name] was diagnosed with invasive ductal carcinoma of the breast. This was treated with lumpectomy and she is currently in radiation.  The tumor was ER+/PR+/Her2-.  She had an oncotype score of 19 which is low and she decided against chemotherapy.    Past Medical History  Diagnosis Date  . Hypertension     2006 B renal arteries patent. 2003 MRA - no RAS. 2003 pheo W/U Dr Hassell Done reportedly negative.  Marland Kitchen CAD (coronary artery disease) 1996    1996 - PTCA and angioplasty diagonal branch. 2000 - Rotoblator & angiopllasty of diagonal. 2006 - subendocardial AMI, DES to proximal LAD.Marland Kitchen Also had 90% stenosis in distal apical LAD. EF 55 with apical hypokinesis. Indefinite ASA and Plavix.  . Abdominal discomfort     Chronic N/V/D. Presumptive dx Crohn's dx per elevated p ANCA. Failed Entocort and Pentasa. Sep 2003 - ileocolectomy c anastomosis per Dr Deon Pilling 2/2 adhesions - path was hegative for Crohns. EGD, Sm bowel follow through (11/03), and an eteroclysis (10/03) were unrevealing. Cuases hypomag and hypocalcemia.  . Anemia     Multifactorial. Baseline HgB 10-11 ish. B12 def - 150 in 3/10. Fe Def - ferritin 35 3/10. Both are being repleted.  . Stroke     Incidental finding MRI 2002 L lacunar infarct  . Diabetes mellitus     Insulin dependent  . Allergy     Seasonal  . Chronic pain     CT 10/10 = Spinal stenosis L2 - S1.  Marland Kitchen Chronic kidney disease     Chronic renal insuff baseline Cr 1.2 - 1.4 ish.  . Adnexal mass 8/03   s/p lap BSO (R ovarian fibroma) & lysis of adhesions  . Hyperlipidemia     Managed with both a statin and Welchol. Welchol stopped 2014 2/2 cost and started on fenofibrate   . Wears dentures     top  . RBBB   . Breast cancer 03/16/13    right, 5 o'clock    Past Surgical History  Procedure Laterality Date  . Ptca  4/06  . Bowel resection  2003    ileocolectomy with anastomosis 2/2 adhesions  . Cholecystectomy    . Hernia repair      Ventral hernia repair  . Bilateral salpingoophorectomy  8/03    Lap BSO (R ovarian fibroma) and adhesion lysis  . Hemicolectomy      R sided hemicolectomy  . Cardiac catheterization      2 stents  . Abdominal hysterectomy    . Breast lumpectomy with needle localization and axillary sentinel lymph node bx Right 04/22/2013    Procedure: BREAST LUMPECTOMY WITH NEEDLE LOCALIZATION AND AXILLARY SENTINEL LYMPH NODE BX;  Surgeon: Stark Klein, MD;  Location: [facility];  Service: General;  Laterality: Right;    History   Social History  . Marital Status: Legally Separated    Spouse Name: N/A    Number of Children: 4  . Years of Education: N/A   Occupational History  .  Social History Main Topics  . Smoking status: Never Smoker   . Smokeless tobacco: Never Used  . Alcohol Use: No  . Drug Use: No  . Sexual Activity: Yes   Other Topics Concern  . None   Social History Narrative  . None    REPRODUCTIVE HISTORY AND PERSONAL RISK ASSESSMENT FACTORS: Menarche was at age 89.   postmenopausal Uterus Intact: no Ovaries Intact: yes G4P4A0, first live birth at age 42  She has not previously undergone treatment for infertility.   Oral Contraceptive use: 9 years   She has used HRT in the past.    FAMILY HISTORY:  We obtained a detailed, 4-generation family history.  Significant diagnoses are listed below: Family History  Problem Relation Age of Onset  . Pancreatic cancer Brother 95  . Cancer Mother   . Breast cancer Mother  29  . Hypertension Daughter   . Lung cancer Maternal Aunt   . Prostate cancer Maternal Uncle   . Heart attack Maternal Grandmother   . Kidney failure Maternal Aunt     Patient's ancestors are of African American descent. There is no reported Ashkenazi Jewish ancestry. There is no known consanguinity.  GENETIC COUNSELING ASSESSMENT: [name] is a 68 y.o. female with a personal history of breast cancer and family history of breast, pancreatic and prostate cancer which somewhat suggestive of a hereditary cancer syndrome and predisposition to cancer. We, therefore, discussed and recommended the following at today's visit.   DISCUSSION: We reviewed the characteristics, features and inheritance patterns of hereditary cancer syndromes. We also discussed genetic testing, including the appropriate family members to test, the process of testing, insurance coverage and turn-around-time for results. We reviewed hereditary cancer syndromes that can increase the risk for breast, pancreatic and prostate cancer including BRCA mutations and PALB2 mutations.  In order to estimate her chance of having a BRCA mutation, we used statistical models (Penn II, Myriad risk calculator) and laboratory data that take into account her personal medical history, family history and ancestry.  Because each model is different, there can be a lot of variability in the risks they give.  Therefore, these numbers must be considered a rough range and not a precise risk of having a BRCA mutation.  These models estimate that she has approximately a 3.8-19% chance of having a mutation. Based on this assessment of her family and personal history, genetic testing is recommended.  PLAN: After considering the risks, benefits, and limitations, [name] provided informed consent to pursue genetic testing and the blood sample will be sent to [facility] for analysis of the women's hereditary panel. We discussed the implications of  a positive, negative and/ or variant of uncertain significance genetic test result. Results should be available within approximately 3 weeks' time, at which point they will be disclosed by telephone to [name], as will any additional recommendations warranted by these results. [name] will receive a summary of her genetic counseling visit and a copy of her results once available. This information will also be available in Epic. We encouraged [name] to remain in contact with cancer genetics annually so that we can continuously update the family history and inform her of any changes in cancer genetics and testing that may be of benefit for her family. [name]'s questions were answered to her satisfaction today. Our contact information was provided should additional questions or concerns arise.  The patient was seen for a total of  60 minutes, greater than 50% of which was spent face-to-face counseling.  This note will also be sent to the referring provider via the electronic medical record. The patient will be supplied with a summary of this genetic counseling discussion as well as educational information on the discussed hereditary cancer syndromes following the conclusion of their visit.   Patient was discussed with Dr. Marcy Panning.   _______________________________________________________________________ For Office Staff:  Number of people involved in session: 2 Was an Intern/ student involved with case: no

## 2013-06-01 ENCOUNTER — Ambulatory Visit
Admission: RE | Admit: 2013-06-01 | Discharge: 2013-06-01 | Disposition: A | Discharge status: Home / Self Care | Payer: Medicare Other | Source: Ambulatory Visit | Attending: Radiation Oncology | Admitting: Radiation Oncology

## 2013-06-01 DIAGNOSIS — C50311 Malignant neoplasm of lower-inner quadrant of right female breast: Secondary | ICD-10-CM

## 2013-06-01 NOTE — Progress Notes (Signed)
Simulation verification note: The patient underwent simulation verification today for treatment to her right breast. Her isocenter is in good position and the multileaf collimators contoured the treatment volume appropriately.

## 2013-06-02 ENCOUNTER — Encounter: Payer: Self-pay | Admitting: Radiation Oncology

## 2013-06-02 ENCOUNTER — Ambulatory Visit
Admission: RE | Admit: 2013-06-02 | Discharge: 2013-06-02 | Disposition: A | Discharge status: Home / Self Care | Payer: Medicare Other | Source: Ambulatory Visit | Attending: Radiation Oncology | Admitting: Radiation Oncology

## 2013-06-02 DIAGNOSIS — C50311 Malignant neoplasm of lower-inner quadrant of right female breast: Secondary | ICD-10-CM

## 2013-06-02 NOTE — Progress Notes (Signed)
Complex simulation note: The patient underwent virtual complex simulation for her right breast boost. She was set up to 3 field technique. 3 separate multileaf collimators were designed to conform the field. She is treated with 6 MV, 10 MV, and 15 MV photons. I am prescribing 1600 cGy 8 sessions. I requesting daily Align RT/optical guidance to assist with her treatment setup.

## 2013-06-03 ENCOUNTER — Ambulatory Visit
Admission: RE | Admit: 2013-06-03 | Discharge: 2013-06-03 | Disposition: A | Discharge status: Home / Self Care | Payer: Medicare Other | Source: Ambulatory Visit | Attending: Radiation Oncology | Admitting: Radiation Oncology

## 2013-06-03 MED ORDER — RADIAPLEXRX EX GEL
Freq: Once | CUTANEOUS | Status: AC
  Administered 2013-06-03: 11:00:00 via TOPICAL

## 2013-06-03 MED ORDER — ALRA NON-METALLIC DEODORANT (RAD-ONC)
1.0000 "application " | Freq: Once | TOPICAL | Status: AC
  Administered 2013-06-03: 1 via TOPICAL

## 2013-06-03 NOTE — Progress Notes (Signed)
Completed post sim education with patient following initial treatment. Provided patient with RADIATION THERAPY AND YOU handbook then, reviewed pertinent information. Educated patient reference potential side effects and managed related to breast radiotherapy such as, skin changes and fatigue. Provided patient with radiaplex and alra then, directed upon use. Allowed patient opportunity to ask questions. All questions answered. Patient verbalized understanding of all reviewed.

## 2013-06-04 ENCOUNTER — Ambulatory Visit
Admission: RE | Admit: 2013-06-04 | Discharge: 2013-06-04 | Disposition: A | Discharge status: Home / Self Care | Payer: Medicare Other | Source: Ambulatory Visit | Attending: Radiation Oncology | Admitting: Radiation Oncology

## 2013-06-05 ENCOUNTER — Ambulatory Visit
Admission: RE | Admit: 2013-06-05 | Discharge: 2013-06-05 | Disposition: A | Discharge status: Home / Self Care | Payer: Medicare Other | Source: Ambulatory Visit | Attending: Radiation Oncology | Admitting: Radiation Oncology

## 2013-06-08 ENCOUNTER — Ambulatory Visit
Admission: RE | Admit: 2013-06-08 | Discharge: 2013-06-08 | Disposition: A | Discharge status: Home / Self Care | Payer: Medicare Other | Source: Ambulatory Visit | Attending: Radiation Oncology | Admitting: Radiation Oncology

## 2013-06-08 VITALS — BP 128/60 | HR 71 | Temp 98.9°F | Ht 64.0 in | Wt 197.6 lb

## 2013-06-08 DIAGNOSIS — C50311 Malignant neoplasm of lower-inner quadrant of right female breast: Secondary | ICD-10-CM

## 2013-06-08 NOTE — Progress Notes (Signed)
[name] has had 5 fractions to her right breast.  She denies fatigue and pain.  She reports soreness in her right breast that started with radiation.  The skin on her right breast is intact.  She is using radiaplex gel twice a day.

## 2013-06-08 NOTE — Progress Notes (Signed)
Weekly Management Note:   Site: Right breast Current Dose:  900  cGy Projected Dose: 4500  cGy followed by boost of 1600 cGy in 8 sessions  Narrative: The patient is seen today for routine under treatment assessment. CBCT/MVCT images/port films were reviewed. The chart was reviewed.   She is without complaints today. She uses Radioplex gel when necessary.  Physical Examination:  Filed Vitals:   06/08/13 1643  BP: 128/60  Pulse: 71  Temp: 98.9 F (37.2 C)  .  Weight: 197 lb 9.6 oz (89.631 kg). No significant skin changes.  Impression: Tolerating radiation therapy well.  Plan: Continue radiation therapy as planned.

## 2013-06-09 ENCOUNTER — Ambulatory Visit
Admission: RE | Admit: 2013-06-09 | Discharge: 2013-06-09 | Disposition: A | Discharge status: Home / Self Care | Payer: Medicare Other | Source: Ambulatory Visit | Attending: Radiation Oncology | Admitting: Radiation Oncology

## 2013-06-10 ENCOUNTER — Ambulatory Visit
Admission: RE | Admit: 2013-06-10 | Discharge: 2013-06-10 | Disposition: A | Discharge status: Home / Self Care | Payer: Medicare Other | Source: Ambulatory Visit | Attending: Radiation Oncology | Admitting: Radiation Oncology

## 2013-06-11 ENCOUNTER — Ambulatory Visit
Admission: RE | Admit: 2013-06-11 | Discharge: 2013-06-11 | Disposition: A | Discharge status: Home / Self Care | Payer: Medicare Other | Source: Ambulatory Visit | Attending: Radiation Oncology | Admitting: Radiation Oncology

## 2013-06-12 ENCOUNTER — Ambulatory Visit
Admission: RE | Admit: 2013-06-12 | Discharge: 2013-06-12 | Disposition: A | Discharge status: Home / Self Care | Payer: Medicare Other | Source: Ambulatory Visit | Attending: Radiation Oncology | Admitting: Radiation Oncology

## 2013-06-15 ENCOUNTER — Encounter: Payer: Self-pay | Admitting: Radiation Oncology

## 2013-06-15 ENCOUNTER — Ambulatory Visit
Admission: RE | Admit: 2013-06-15 | Discharge: 2013-06-15 | Disposition: A | Discharge status: Home / Self Care | Payer: Medicare Other | Source: Ambulatory Visit | Attending: Radiation Oncology | Admitting: Radiation Oncology

## 2013-06-15 VITALS — BP 146/74 | HR 77 | Resp 16 | Wt 196.5 lb

## 2013-06-15 DIAGNOSIS — C50311 Malignant neoplasm of lower-inner quadrant of right female breast: Secondary | ICD-10-CM

## 2013-06-15 MED ORDER — RADIAPLEXRX EX GEL
Freq: Once | CUTANEOUS | Status: AC
  Administered 2013-06-15: 16:00:00 via TOPICAL

## 2013-06-15 NOTE — Progress Notes (Signed)
   Weekly Management Note:  outpatient Current Dose:  18 Gy  Projected Dose: 45 Gy initial  Narrative:  The patient presents for routine under treatment assessment.  CBCT/MVCT images/Port film x-rays were reviewed.  The chart was checked. Mild fatigue thus far.  Physical Findings:  weight is 196 lb 8 oz (89.132 kg). Her blood pressure is 146/74 and her pulse is 77. Her respiration is 16.  hyperpigmentation of right breast.   Impression:  The patient is tolerating radiotherapy.  Plan:  Continue radiotherapy as planned.   ________________________________   Eppie Gibson, M.D.

## 2013-06-15 NOTE — Progress Notes (Signed)
Hyperpigmentation of treated skin without desquamation noted. Reports using radiaplex bid as directed. Reports mild fatigue. Provided patient with additional tube of radiaplex.

## 2013-06-16 ENCOUNTER — Ambulatory Visit
Admission: RE | Admit: 2013-06-16 | Discharge: 2013-06-16 | Disposition: A | Discharge status: Home / Self Care | Payer: Medicare Other | Source: Ambulatory Visit | Attending: Radiation Oncology | Admitting: Radiation Oncology

## 2013-06-17 ENCOUNTER — Ambulatory Visit
Admission: RE | Admit: 2013-06-17 | Discharge: 2013-06-17 | Disposition: A | Discharge status: Home / Self Care | Payer: Medicare Other | Source: Ambulatory Visit | Attending: Radiation Oncology | Admitting: Radiation Oncology

## 2013-06-18 ENCOUNTER — Ambulatory Visit
Admission: RE | Admit: 2013-06-18 | Discharge: 2013-06-18 | Disposition: A | Discharge status: Home / Self Care | Payer: Medicare Other | Source: Ambulatory Visit | Attending: Radiation Oncology | Admitting: Radiation Oncology

## 2013-06-19 ENCOUNTER — Ambulatory Visit
Admission: RE | Admit: 2013-06-19 | Discharge: 2013-06-19 | Disposition: A | Discharge status: Home / Self Care | Payer: Medicare Other | Source: Ambulatory Visit | Attending: Radiation Oncology | Admitting: Radiation Oncology

## 2013-06-19 ENCOUNTER — Telehealth: Payer: Self-pay | Admitting: *Deleted

## 2013-06-19 NOTE — Telephone Encounter (Signed)
Mailed after appt letter to pt. 

## 2013-06-22 ENCOUNTER — Ambulatory Visit
Admission: RE | Admit: 2013-06-22 | Discharge: 2013-06-22 | Disposition: A | Discharge status: Home / Self Care | Payer: Medicare Other | Source: Ambulatory Visit | Attending: Radiation Oncology | Admitting: Radiation Oncology

## 2013-06-22 VITALS — BP 120/49 | HR 75 | Temp 98.2°F | Ht 64.0 in | Wt 194.9 lb

## 2013-06-22 DIAGNOSIS — C50311 Malignant neoplasm of lower-inner quadrant of right female breast: Secondary | ICD-10-CM

## 2013-06-22 NOTE — Progress Notes (Signed)
Weekly Management Note:  Site: Right breast Current Dose:  2700  cGy Projected Dose: 4500  cGy followed by 8 fraction boost  Narrative: The patient is seen today for routine under treatment assessment. CBCT/MVCT images/port films were reviewed. The chart was reviewed.   She is without complaints today. She uses Radioplex gel.  Physical Examination:  Filed Vitals:   06/22/13 1643  BP: 120/49  Pulse: 75  Temp: 98.2 F (36.8 C)  .  Weight: 194 lb 14.4 oz (88.406 kg). There is mild to moderate hyperpigmentation the skin with no areas of dry desquamation.  Impression: Tolerating radiation therapy well.  Plan: Continue radiation therapy as planned.

## 2013-06-22 NOTE — Progress Notes (Signed)
[name] has received 15 fractions to her right breast.  She denies any pain, but reports fatigue.  Her skin right breast/ inframmary fold with hyperpigmentation with mild dryness, but skin remains intact.

## 2013-06-23 ENCOUNTER — Ambulatory Visit
Admission: RE | Admit: 2013-06-23 | Discharge: 2013-06-23 | Disposition: A | Discharge status: Home / Self Care | Payer: Medicare Other | Source: Ambulatory Visit | Attending: Radiation Oncology | Admitting: Radiation Oncology

## 2013-06-24 ENCOUNTER — Ambulatory Visit
Admission: RE | Admit: 2013-06-24 | Discharge: 2013-06-24 | Disposition: A | Discharge status: Home / Self Care | Payer: Medicare Other | Source: Ambulatory Visit | Attending: Radiation Oncology | Admitting: Radiation Oncology

## 2013-06-25 ENCOUNTER — Ambulatory Visit
Admission: RE | Admit: 2013-06-25 | Discharge: 2013-06-25 | Disposition: A | Discharge status: Home / Self Care | Payer: Medicare Other | Source: Ambulatory Visit | Attending: Radiation Oncology | Admitting: Radiation Oncology

## 2013-06-26 ENCOUNTER — Ambulatory Visit
Admission: RE | Admit: 2013-06-26 | Discharge: 2013-06-26 | Disposition: A | Discharge status: Home / Self Care | Payer: Medicare Other | Source: Ambulatory Visit | Attending: Radiation Oncology | Admitting: Radiation Oncology

## 2013-06-29 ENCOUNTER — Ambulatory Visit
Admission: RE | Admit: 2013-06-29 | Discharge: 2013-06-29 | Disposition: A | Discharge status: Home / Self Care | Payer: Medicare Other | Source: Ambulatory Visit | Attending: Radiation Oncology | Admitting: Radiation Oncology

## 2013-06-29 VITALS — BP 112/54 | HR 71 | Temp 98.7°F | Ht 64.0 in | Wt 194.6 lb

## 2013-06-29 DIAGNOSIS — C50311 Malignant neoplasm of lower-inner quadrant of right female breast: Secondary | ICD-10-CM

## 2013-06-29 NOTE — Progress Notes (Signed)
[name] has had 20 fractions to her right breast.  She denies pain but does have burning underneath her right breast.  She reports fatigue.  The skin on her right breast is intact.  Underneath her breast, she has a small area of desquamation.  She is using radiaplex gel twice a day.

## 2013-06-29 NOTE — Progress Notes (Signed)
Weekly Management Note:  Site: Right breast Current Dose:  3600  cGy Projected Dose: 4500  cGy  Narrative: The patient is seen today for routine under treatment assessment. CBCT/MVCT images/port films were reviewed. The chart was reviewed.   She is developing desquamation along the inframammary region. She tells me she started to applied Neosporin ointment. She also uses Radioplex gel.  Physical Examination:  Filed Vitals:   06/29/13 1659  BP: 112/54  Pulse: 71  Temp: 98.7 F (37.1 C)  .  Weight: 194 lb 9.6 oz (88.27 kg). There is marked hyperpigmentation the skin along the right breast with focal linear moist disclamation along the inframammary fold.  Impression: Tolerating radiation therapy well, however she does have focal moist desquamation. This is expected to worsen over the next week. She may applied Neosporin ointment.  Plan: Continue radiation therapy as planned.

## 2013-06-30 ENCOUNTER — Ambulatory Visit
Admission: RE | Admit: 2013-06-30 | Discharge: 2013-06-30 | Disposition: A | Discharge status: Home / Self Care | Payer: Medicare Other | Source: Ambulatory Visit | Attending: Radiation Oncology | Admitting: Radiation Oncology

## 2013-07-01 ENCOUNTER — Ambulatory Visit
Admission: RE | Admit: 2013-07-01 | Discharge: 2013-07-01 | Disposition: A | Discharge status: Home / Self Care | Payer: Medicare Other | Source: Ambulatory Visit | Attending: Radiation Oncology | Admitting: Radiation Oncology

## 2013-07-02 ENCOUNTER — Encounter: Payer: Self-pay | Admitting: Genetic Counselor

## 2013-07-02 ENCOUNTER — Ambulatory Visit
Admission: RE | Admit: 2013-07-02 | Discharge: 2013-07-02 | Disposition: A | Discharge status: Home / Self Care | Payer: Medicare Other | Source: Ambulatory Visit | Attending: Radiation Oncology | Admitting: Radiation Oncology

## 2013-07-02 NOTE — Progress Notes (Signed)
This is a brief note to document [name]'s genetic test results. These are provided on behalf of [name] (Dietitian) who saw [name] on 05/28/13. Please refer to her clinic note for a full medical and family history.   GENETIC TESTING: At the time of [name]'s visit, [name] recommended she pursue genetic testing of multiple genes. This test included sequencing and deletion/duplication analysis of 16 genes (ATM, BRCA1, BRCA2, BRIP1, CDH1, EPCAM, MLH1, MSH2, MSH6, NBN, PALB2, PTEN, RAD51C, STK11, and TP53), as well as analysis of one common mutation in CHEK2 gene (c.[id number]). Testing was performed at [facility]. Testing was normal and did not reveal a mutation in these genes.   We discussed with [name] that since the current test is not perfect, it is possible there may be a gene mutation that current testing cannot detect, but that chance is small. We also discussed that it is possible that a different genetic factor, which has not yet been discovered, is responsible for her cancer diagnosis. Again, this chance is predicted to be small given her age at breast cancer diagnosis. We discussed that if she has family members diagnosed with rare cancers or diagnosed at young ages, they should seek a genetics evaluation.  CANCER SCREENING: We will defer [name]'s cancer screenings to her physicians.   Cancer genetics is a rapidly advancing field and it is possible that new genetic tests will be appropriate for [name] in the future. We encourage her to remain in contact with [place] on an annual basis so we can update her personal and family histories, and let her know of advances in cancer genetics that may benefit her. Our contact number was provided. [name] questions were answered to her satisfaction today, and she knows she is welcome to call anytime with additional questions.   Katie Berg, MS, [place]  Certified Genetic Counseor  phone: [phone]   [email or web address]

## 2013-07-03 ENCOUNTER — Ambulatory Visit
Admission: RE | Admit: 2013-07-03 | Discharge: 2013-07-03 | Disposition: A | Discharge status: Home / Self Care | Payer: Medicare Other | Source: Ambulatory Visit | Attending: Radiation Oncology | Admitting: Radiation Oncology

## 2013-07-06 ENCOUNTER — Ambulatory Visit
Admission: RE | Admit: 2013-07-06 | Discharge: 2013-07-06 | Disposition: A | Discharge status: Home / Self Care | Payer: Medicare Other | Source: Ambulatory Visit | Attending: Radiation Oncology | Admitting: Radiation Oncology

## 2013-07-06 VITALS — BP 137/59 | HR 69 | Temp 98.5°F | Ht 64.0 in | Wt 194.2 lb

## 2013-07-06 DIAGNOSIS — C50311 Malignant neoplasm of lower-inner quadrant of right female breast: Secondary | ICD-10-CM

## 2013-07-06 MED ORDER — RADIAPLEXRX EX GEL
Freq: Once | CUTANEOUS | Status: AC
  Administered 2013-07-06: 17:00:00 via TOPICAL

## 2013-07-06 NOTE — Progress Notes (Signed)
Weekly Management Note:  Site: Right breast Current Dose:  4500  cGy Projected Dose: 4500  cGy followed by 8 fraction boost  Narrative: The patient is seen today for routine under treatment assessment. CBCT/MVCT images/port films were reviewed. The chart was reviewed.   She is without new complaints today. She is to have right breast discomfort particularly along her inframammary region where she has a moist desquamation. She uses Radioplex gel.  Physical Examination:  Filed Vitals:   07/06/13 1640  BP: 137/59  Pulse: 69  Temp: 98.5 F (36.9 C)  .  Weight: 194 lb 3.2 oz (88.089 kg). There is localized moist desquamation along the inframammary region. There is dry desquamation elsewhere in addition to hyperpigmentation of her skin.  Impression: Tolerating radiation therapy well, however, she does have moist desquamation along the inframammary region. I will have her apply antibiotic ointment to the area of moist desquamation. She may take Aleve 3 times a day when necessary pain.  Plan: Continue radiation therapy as planned.

## 2013-07-06 NOTE — Progress Notes (Signed)
[name] has had 25 fractions to her right breast.  She is reporting pain at 5/10 in her right breast.   She reports fatigue.  The skin on her right breast has hyperpigmentation.  She has some areas of desquamation underneath her right breast.  She is using radiaplex and would like a refill.  Another tube has been given.

## 2013-07-07 ENCOUNTER — Ambulatory Visit
Admission: RE | Admit: 2013-07-07 | Discharge: 2013-07-07 | Disposition: A | Discharge status: Home / Self Care | Payer: Medicare Other | Source: Ambulatory Visit | Attending: Radiation Oncology | Admitting: Radiation Oncology

## 2013-07-08 ENCOUNTER — Ambulatory Visit
Admission: RE | Admit: 2013-07-08 | Discharge: 2013-07-08 | Disposition: A | Discharge status: Home / Self Care | Payer: Medicare Other | Source: Ambulatory Visit | Attending: Radiation Oncology | Admitting: Radiation Oncology

## 2013-07-09 ENCOUNTER — Ambulatory Visit
Admission: RE | Admit: 2013-07-09 | Discharge: 2013-07-09 | Disposition: A | Discharge status: Home / Self Care | Payer: Medicare Other | Source: Ambulatory Visit | Attending: Radiation Oncology | Admitting: Radiation Oncology

## 2013-07-10 ENCOUNTER — Ambulatory Visit
Admission: RE | Admit: 2013-07-10 | Discharge: 2013-07-10 | Disposition: A | Discharge status: Home / Self Care | Payer: Medicare Other | Source: Ambulatory Visit | Attending: Radiation Oncology | Admitting: Radiation Oncology

## 2013-07-13 ENCOUNTER — Ambulatory Visit
Admission: RE | Admit: 2013-07-13 | Discharge: 2013-07-13 | Disposition: A | Discharge status: Home / Self Care | Payer: Medicare Other | Source: Ambulatory Visit | Attending: Radiation Oncology | Admitting: Radiation Oncology

## 2013-07-13 VITALS — BP 136/52 | HR 75 | Temp 98.8°F | Resp 20 | Wt 194.7 lb

## 2013-07-13 DIAGNOSIS — C50311 Malignant neoplasm of lower-inner quadrant of right female breast: Secondary | ICD-10-CM

## 2013-07-13 NOTE — Progress Notes (Addendum)
Pt c/o pain under her right breast due to moist desquamation. She takes Aleve prn for the pain w/fair relief. She is applying Neosporin. She is applying Radiaplex to remainder of right breast treatment area for darkening of skin. Advised she continue to apply Neosporin under breast. Pt is fatigued.

## 2013-07-13 NOTE — Progress Notes (Signed)
Weekly Management Note:  Site: Right breast Current Dose:  5500  cGy Projected Dose: 6100  cGy  Narrative: The patient is seen today for routine under treatment assessment. CBCT/MVCT images/port films were reviewed. The chart was reviewed.   She is currently receiving her boost. Unfortunately, the planning target volume does include the inframammary region where she has a moist desquamation. She uses Neosporin for her moist desquamation along the inframammary region. She takes Aleve when necessary pain. She feels that this is satisfactory.  Physical Examination:  Filed Vitals:   07/13/13 1628  BP: 136/52  Pulse: 75  Temp: 98.8 F (37.1 C)  Resp: 20  .  Weight: 194 lb 11.2 oz (88.315 kg). There is marked hyperpigmentation the skin along the left breast with confluent moist desquamation along the inframammary region. There is also dry desquamation along the lower axilla/upper outer quadrant of the breast.   Impression: Tolerating radiation therapy well, with the exception of moist desquamation. She has 3 more treatments.  Plan: Continue radiation therapy as planned.

## 2013-07-14 ENCOUNTER — Ambulatory Visit
Admission: RE | Admit: 2013-07-14 | Discharge: 2013-07-14 | Disposition: A | Discharge status: Home / Self Care | Payer: Medicare Other | Source: Ambulatory Visit | Attending: Radiation Oncology | Admitting: Radiation Oncology

## 2013-07-15 ENCOUNTER — Ambulatory Visit
Admission: RE | Admit: 2013-07-15 | Discharge: 2013-07-15 | Disposition: A | Discharge status: Home / Self Care | Payer: Medicare Other | Source: Ambulatory Visit | Attending: Radiation Oncology | Admitting: Radiation Oncology

## 2013-07-15 ENCOUNTER — Telehealth: Payer: Self-pay | Admitting: *Deleted

## 2013-07-15 NOTE — Telephone Encounter (Signed)
Left vm for pt to r/s appt with Dr. Humphrey Rolls d/t LOA.

## 2013-07-16 ENCOUNTER — Encounter: Payer: Self-pay | Admitting: Radiation Oncology

## 2013-07-16 ENCOUNTER — Ambulatory Visit
Admission: RE | Admit: 2013-07-16 | Discharge: 2013-07-16 | Disposition: A | Discharge status: Home / Self Care | Payer: Medicare Other | Source: Ambulatory Visit | Attending: Radiation Oncology | Admitting: Radiation Oncology

## 2013-07-16 NOTE — Progress Notes (Signed)
Simulation verification note: On 07/07/2013 the patient underwent simulation verification for her right breast boost. Her isocenter was in good position and the multileaf collimators contoured the treatment volume appropriately.

## 2013-07-16 NOTE — Progress Notes (Signed)
[facility] End of Treatment Note  Name:[name]  Date: 07/16/2013 EHO:[id number] DOB:08-28-1945   Status:outpatient    CC: Larey Dresser, MD  Dr. Stark Klein  REFERRING PHYSICIAN:    Dr. Stark Klein   DIAGNOSIS: Stage I (T1b N0 M0) invasive ductal/DCIS the right breast   INDICATION FOR TREATMENT: Curative   TREATMENT DATES: 06/02/2013 through 07/16/2013                          SITE/DOSE: Right breast 4500 cGy 25 sessions, right breast boost 1600 cGy in 8 sessions                           BEAMS/ENERGY: Mixed 6 MV, 10 MV, and 15 MV photons, right breast tangents and also three-field right breast boost                  NARRATIVE:   The patient tolerated her treatment reasonably well, however she developed a moist desquamation along the inframammary region due to her breast size and location of her tumor bed. She was treated with antibiotic ointment in addition to Radioplex gel for areas of dry desquamation. With her moist desquamation she developed hypopigmentation of the skin.                         PLAN: Routine followup in one month. Patient instructed to call if questions or worsening complaints in interim. She is scheduled to visit medical oncology for discussion of antiestrogen therapy on May 21.

## 2013-07-16 NOTE — Progress Notes (Signed)
  Radiation Oncology         [phone] ________________________________  Name: [name] MRN: [id number]  Date: May 27, 2013  DOB: [birth year]  Optical Surface Tracking Plan:  Since intensity modulated radiotherapy (IMRT) and 3D conformal radiation treatment methods are predicated on accurate and precise positioning for treatment, intrafraction motion monitoring is medically necessary to ensure accurate and safe treatment delivery.  The ability to quantify intrafraction motion without excessive ionizing radiation dose can only be performed with optical surface tracking. Accordingly, surface imaging offers the opportunity to obtain 3D measurements of patient position throughout IMRT and 3D treatments without excessive radiation exposure.  I am ordering optical surface tracking for this patient's upcoming course of radiotherapy. ________________________________  Rexene Edison, MD May 27, 2013 5:43 PM    Reference:   Particia Jasper, et al. Surface imaging-based analysis of intrafraction motion for breast radiotherapy patients.Journal of [place], n. 6, nov. 2014. ISSN [id number].   Available at: <http://www.jacmp.org/index.php/jacmp/article/view/4957>.

## 2013-07-17 ENCOUNTER — Telehealth: Payer: Self-pay | Admitting: *Deleted

## 2013-07-17 NOTE — Telephone Encounter (Signed)
Called pt to r/s appt with Dr. Humphrey Rolls d/t LOA.  R/S appt with Dr. [name] on 08/06/13 at 3:30.  Confirmed new appt date and time.  Pt denies further needs at this time.

## 2013-07-22 ENCOUNTER — Encounter: Payer: Self-pay | Admitting: Radiation Oncology

## 2013-07-22 NOTE — Progress Notes (Signed)
Weekly Management Note: (07/16/2013)  Site: Right breast Current Dose:  6100  cGy Projected Dose: 6100  cGy  Narrative: The patient is seen today for routine under treatment assessment. CBCT/MVCT images/port films were reviewed. The chart was reviewed.   The patient was seen on 07/16/2013 in addressing area following her last treatment. She was without new complaints.  Physical Examination: There were no vitals filed for this visit..  Weight:  . On inspection the right breast there was partial reepithelialization of the previously noted moist desquamation. No other significant changes.  Impression: Radiation therapy completed.  Plan: Followup visit in one month.

## 2013-07-24 ENCOUNTER — Encounter: Payer: Self-pay | Admitting: Cardiovascular Disease

## 2013-08-02 ENCOUNTER — Other Ambulatory Visit: Payer: Self-pay | Admitting: Oncology

## 2013-08-02 DIAGNOSIS — C50311 Malignant neoplasm of lower-inner quadrant of right female breast: Secondary | ICD-10-CM

## 2013-08-02 DIAGNOSIS — D649 Anemia, unspecified: Secondary | ICD-10-CM

## 2013-08-06 ENCOUNTER — Other Ambulatory Visit: Payer: Self-pay | Admitting: *Deleted

## 2013-08-06 ENCOUNTER — Other Ambulatory Visit (HOSPITAL_BASED_OUTPATIENT_CLINIC_OR_DEPARTMENT_OTHER): Payer: Medicare Other

## 2013-08-06 ENCOUNTER — Ambulatory Visit (HOSPITAL_BASED_OUTPATIENT_CLINIC_OR_DEPARTMENT_OTHER): Payer: Medicare Other | Admitting: Oncology

## 2013-08-06 ENCOUNTER — Telehealth: Payer: Self-pay | Admitting: Oncology

## 2013-08-06 ENCOUNTER — Ambulatory Visit: Payer: Medicare Other | Admitting: Oncology

## 2013-08-06 ENCOUNTER — Encounter: Payer: Self-pay | Admitting: Oncology

## 2013-08-06 VITALS — BP 137/70 | HR 70 | Temp 98.3°F | Resp 18 | Ht 64.0 in | Wt 192.2 lb

## 2013-08-06 DIAGNOSIS — B372 Candidiasis of skin and nail: Secondary | ICD-10-CM

## 2013-08-06 DIAGNOSIS — D649 Anemia, unspecified: Secondary | ICD-10-CM

## 2013-08-06 DIAGNOSIS — C50319 Malignant neoplasm of lower-inner quadrant of unspecified female breast: Secondary | ICD-10-CM

## 2013-08-06 DIAGNOSIS — C50311 Malignant neoplasm of lower-inner quadrant of right female breast: Secondary | ICD-10-CM

## 2013-08-06 DIAGNOSIS — N189 Chronic kidney disease, unspecified: Secondary | ICD-10-CM

## 2013-08-06 LAB — CBC WITH DIFFERENTIAL/PLATELET
BASO%: 0.5 % (ref 0.0–2.0)
Basophils Absolute: 0 10*3/uL (ref 0.0–0.1)
EOS ABS: 0.1 10*3/uL (ref 0.0–0.5)
EOS%: 2.2 % (ref 0.0–7.0)
HEMATOCRIT: 27.3 % — AB (ref 34.8–46.6)
HGB: 8.6 g/dL — ABNORMAL LOW (ref 11.6–15.9)
LYMPH%: 20 % (ref 14.0–49.7)
MCH: 24.7 pg — ABNORMAL LOW (ref 25.1–34.0)
MCHC: 31.7 g/dL (ref 31.5–36.0)
MCV: 78 fL — AB (ref 79.5–101.0)
MONO#: 0.6 10*3/uL (ref 0.1–0.9)
MONO%: 8.8 % (ref 0.0–14.0)
NEUT#: 4.4 10*3/uL (ref 1.5–6.5)
NEUT%: 68.5 % (ref 38.4–76.8)
Platelets: 296 10*3/uL (ref 145–400)
RBC: 3.5 10*6/uL — AB (ref 3.70–5.45)
RDW: 14.6 % — ABNORMAL HIGH (ref 11.2–14.5)
WBC: 6.4 10*3/uL (ref 3.9–10.3)
lymph#: 1.3 10*3/uL (ref 0.9–3.3)

## 2013-08-06 LAB — COMPREHENSIVE METABOLIC PANEL (CC13)
ALK PHOS: 91 U/L (ref 40–150)
ALT: 12 U/L (ref 0–55)
AST: 14 U/L (ref 5–34)
Albumin: 3.4 g/dL — ABNORMAL LOW (ref 3.5–5.0)
Anion Gap: 11 mEq/L (ref 3–11)
BUN: 29.2 mg/dL — AB (ref 7.0–26.0)
CO2: 24 mEq/L (ref 22–29)
Calcium: 9.6 mg/dL (ref 8.4–10.4)
Chloride: 102 mEq/L (ref 98–109)
Creatinine: 1.8 mg/dL — ABNORMAL HIGH (ref 0.6–1.1)
GLUCOSE: 283 mg/dL — AB (ref 70–140)
Potassium: 4.1 mEq/L (ref 3.5–5.1)
SODIUM: 137 meq/L (ref 136–145)
TOTAL PROTEIN: 8.3 g/dL (ref 6.4–8.3)
Total Bilirubin: 0.26 mg/dL (ref 0.20–1.20)

## 2013-08-06 MED ORDER — FERROUS FUMARATE 325 (106 FE) MG PO TABS: 1.0000 | ORAL_TABLET | Freq: Every day | ORAL | Status: DC

## 2013-08-06 MED ORDER — ANASTROZOLE 1 MG PO TABS: 1.0000 mg | ORAL_TABLET | Freq: Every day | ORAL | Status: DC

## 2013-08-06 MED ORDER — NYSTATIN 100000 UNIT/GM EX POWD: CUTANEOUS | Status: DC

## 2013-08-06 NOTE — Progress Notes (Signed)
OFFICE PROGRESS NOTE   08/06/2013   Physicians: Marcy Panning; Larey Dresser; [place], Herbie [place]; [place]; [place], [place] Jersey[place]lean Purl, C  As this was first time that I have seen patient, I have reviewed EMR information concerning the breast cancer and other medical conditions extensively, and discussed history with patient and family now.  INTERVAL HISTORY:  Patient is seen in Dr Laurelyn Sickle absence, together with 2 daughters, now to begin adjuvant estrogen blocker for stage 1 invasive ductal carcinoma of right breast. Radiation was completed on 07-16-13. She is to see Drs Valere Dross and [name] again in June. She has not had bone density scan, but is in agreement with having this scheduled for baseline.   Patient had moist desquamation with radiation, particularly in inframammary region, which is gradually improving. She continues to use antibiotic ointment and Radioplex gel. She has some discomfort with elevation of left shoulder since radiation. She does not have significant hot flashes.   ONCOLOGIC HISTORY #1Screening mammogram on 02/06/2013.possibly showed a mass in the right breast; 02/27/2013  diagnostic mammogram and ultrasound showed 6 x 5 mm mass at 5:00 position. Lymph nodes were not enlarged. She did not have an MRI. She underwent a biopsy on 03/16/2013 this revealed invasive ductal carcinoma, low grade. Tumor was ER positive PR positive HER-2/neu negative with a proliferation marker Ki-67 of 20%.  #2 s/p lumpectomy with sentinel lymph node biopsy on 05/12/13 with final pathology revealing:  Grade: II of III  Tumor size (gross measurement: 0.9 cm  Lymph nodes: 0/6  Breast prognostic profile:  Estrogen receptor: Not repeated, previous study demonstrated 100% positivity  Progesterone receptor: Not repeated, previous study demonstrated 12% positivity  Her 2 neu: Repeated, previous study demonstrated no amplification (1.79)  #3 Oncotype Dx testing score: Her Oncotype score was 19 giving her a 12% risk  of distant recurrence with tamoxifen only for 5 years. She does not want chemotherapy. #4.Radiation given 06-02-13 thru 07-16-13 4500 Gy to right breast with boost 1600 Gy.   Review of systems as above, also: No cough or other respiratory symptoms. No GI changes. Skin rash beneath left breast, which has been chronic problem bilaterally. No LE swelling. No bleeding. No increased SOB with exertion, no chest pain. Remainder of 10 point Review of Systems negative.  Past Medical History:  Anemia x years, remotely treated with IM iron and also on po ferrous sulfate x years (tho does not take this regularly). Colonoscopy by Dr Silvano Rusk [phone] remarkable only for right hemicolectomy (ileocolectomy secondary to adhesions, Crohn's ruled out). Hx B12 deficiency 2010 and iron deficiency 2010 DM HTN, CAD  Elevated lipids Chronic renal insufficiency, baseline creatinine 1.2 - 1.4 Incidental lacunar infarct found on MRI 2002 Right BSO  Family History Mother breast cancer age 50 Brother pancreatic cancer No family history of sickle cell disease or sickle trait.  Social History: never smoker. 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 Daughter works with internal medicine group in [place].  Objective:  Vital signs in last 24 hours:  BP 137/70  Pulse 70  Temp(Src) 98.3 F (36.8 C) (Oral)  Resp 18  Ht _0  (1.626 m)  Wt 192 lb 3.2 oz (87.181 kg)  BMI 32.97 kg/m2  Alert, oriented and appropriate. Ambulatory without difficulty. Respirations not labored RA.  HEENT:PERRL, sclerae not icteric. Oral mucosa moist without lesions, posterior pharynx clear.  Neck supple. No JVD.  Lymphatics:no cervical,suraclavicular, axillary or inguinal adenopathy Resp: clear to auscultation bilaterally and normal percussion bilaterally Cardio: regular rate and rhythm. No gallop. GI: soft, nontender,  not distended, no mass or organomegaly. Normally active bowel sounds.  Musculoskeletal/ Extremities: without pitting edema,  cords, tenderness. Able to elevate at right shoulder with some difficulty Neuro: no peripheral neuropathy. Otherwise nonfocal. PSYCH mood and affect appropriate Skin without rash, ecchymosis, petechiae otherwise Breasts: Right with moist desquamation inferiorly, and hyperpigmentation superiorly. Left without dominant mass, skin or nipple findings or concern. Skin beneath left breast with candida rash. Axillae benign.   Lab Results:  Results for orders placed in visit on 08/06/13  CBC WITH DIFFERENTIAL      Result Value Ref Range   WBC 6.4  3.9 - 10.3 10e3/uL   NEUT# 4.4  1.5 - 6.5 10e3/uL   HGB 8.6 (*) 11.6 - 15.9 g/dL   HCT 27.3 (*) 34.8 - 46.6 %   Platelets 296  145 - 400 10e3/uL   MCV 78.0 (*) 79.5 - 101.0 fL   MCH 24.7 (*) 25.1 - 34.0 pg   MCHC 31.7  31.5 - 36.0 g/dL   RBC 3.50 (*) 3.70 - 5.45 10e6/uL   RDW 14.6 (*) 11.2 - 14.5 %   lymph# 1.3  0.9 - 3.3 10e3/uL   MONO# 0.6  0.1 - 0.9 10e3/uL   Eosinophils Absolute 0.1  0.0 - 0.5 10e3/uL   Basophils Absolute 0.0  0.0 - 0.1 10e3/uL   NEUT% 68.5  38.4 - 76.8 %   LYMPH% 20.0  14.0 - 49.7 %   MONO% 8.8  0.0 - 14.0 %   EOS% 2.2  0.0 - 7.0 %   BASO% 0.5  0.0 - 2.0 %  COMPREHENSIVE METABOLIC PANEL (MQ28)      Result Value Ref Range   Sodium 137  136 - 145 mEq/L   Potassium 4.1  3.5 - 5.1 mEq/L   Chloride 102  98 - 109 mEq/L   CO2 24  22 - 29 mEq/L   Glucose 283 (*) 70 - 140 mg/dl   BUN 29.2 (*) 7.0 - 26.0 mg/dL   Creatinine 1.8 (*) 0.6 - 1.1 mg/dL   Total Bilirubin 0.26  0.20 - 1.20 mg/dL   Alkaline Phosphatase 91  40 - 150 U/L   AST 14  5 - 34 U/L   ALT 12  0 - 55 U/L   Total Protein 8.3  6.4 - 8.3 g/dL   Albumin 3.4 (*) 3.5 - 5.0 g/dL   Calcium 9.6  8.4 - 10.4 mg/dL   Anion Gap 11  3 - 11 mEq/L    Iron studies available after visit have serum iron 90, %sat 21 and ferritin 13. Studies/Results:  No results found.  Medications: I have reviewed the patient's current medications. She is on monthly B12 and has ferrous  sulfate listed on meds but has not been taking this; will try hemocyte (ferrous fumarate) instead of the ferrous sulfate. Discussed taking po iron on empty stomach with OJ or Vit C She will use mycostatin powder beneath left breast (after drying with hair dryer on low setting) beginning now, and can use this also under right breast when radiation skin irritation is resolved.  Will begin Arimidex 1 mg daily. Recommended Calcium with D 600 mg bid  DISCUSSION: Patient and family are comfortable with Dr Laurelyn Sickle plan to begin aromatase inhibitor adjuvantly as she has now completed radiation. We have reviewed mechanism of action of the aromatase inhibitors and possible side effects including arthralgias. Although hot flashes are possible, these generally are not severe in patients who are not already having significant hot  flashes. We have discussed bone density concerns and   We have discussed the chronic anemia and she is in agreement with some further evaluation with next labs done here.  Assessment/Plan: 1. T1N0 invasive ductal carcinoma of right breast: post lumpectomy and 6 sentinel nodes 04-22-2013, ER and PR +, HER 2 negative, Ki67 of 27%. Post radiation and to begin arimidex. She will keep appointments in 08-2013 with Drs Valere Dross and [name]. She will be seen back by medical oncology in 3 months. Bone density scan to be done at [facility] 2.moist skin desquamation right breast from radiation: gradually improving 3.diabetes on insulin 4.chronic renal insufficiency 5. CAD, HTN: followed by Dr Johnsie Cancel 6.post right colectomy (or ileocolectomy)  7.chronic anemia: likely multifactorial, on oral iron and B12. Renal insufficiency likely contributing. 8.candida skin rash beneath left breast: as above.    She and family know that they can call prior to next scheduled visit if questions or concerns regarding the Arimidex or the breast cancer otherwise. They have had all questions answered and are in  agreement with plan above. Time spent 40 min including >50% counseling and coordination of care.  Gordy Levan, MD   08/06/2013, 5:15 PM

## 2013-08-06 NOTE — Telephone Encounter (Signed)
, °

## 2013-08-06 NOTE — Patient Instructions (Signed)
Dry beneath breasts well with hair dryer on low after you bathe, then use mycostatin powder under left now and under right as soon as a little better healed.  We will set up bone density scan at [facility] in next few weeks.  Calcium with vitamin D 600 mg twice daily would be a good idea, as we like to optimize things that help bone density when you are on aromatase inhibitor  You can start Arimidex one tablet daily, which is an aromatase inhibitor for the breast cancer. Call if you have questions or need anything with this before next appointment.  We will send prescription for another iron preparation to your pharmacy. Best to take on empty stomach with orange juice or vitamin C

## 2013-08-07 LAB — FERRITIN CHCC: FERRITIN: 13 ng/mL (ref 9–269)

## 2013-08-07 LAB — IRON AND TIBC CHCC
%SAT: 21 % (ref 21–57)
Iron: 90 ug/dL (ref 41–142)
TIBC: 438 ug/dL (ref 236–444)
UIBC: 348 ug/dL (ref 120–384)

## 2013-08-13 ENCOUNTER — Encounter: Payer: Self-pay | Admitting: *Deleted

## 2013-08-18 ENCOUNTER — Encounter: Payer: Self-pay | Admitting: Radiation Oncology

## 2013-08-18 ENCOUNTER — Ambulatory Visit
Admission: RE | Admit: 2013-08-18 | Discharge: 2013-08-18 | Disposition: A | Discharge status: Home / Self Care | Payer: Medicare Other | Source: Ambulatory Visit | Attending: Radiation Oncology | Admitting: Radiation Oncology

## 2013-08-18 VITALS — BP 145/70 | HR 68 | Temp 98.4°F | Resp 20 | Wt 190.6 lb

## 2013-08-18 DIAGNOSIS — C50311 Malignant neoplasm of lower-inner quadrant of right female breast: Secondary | ICD-10-CM

## 2013-08-18 NOTE — Progress Notes (Signed)
CC: Dr. Larey Dresser  Followup note: Ms. Laborde returns today approximately 5 weeks following completion of radiation therapy following conservative surgery in the management of her T1b invasive ductal/DCIS of the right breast. She is generally doing well except for worsening right shoulder discomfort. This began shortly after completion of radiation therapy. She has been on Arimidex for the past 2 weeks through Dr. [name]. She will see Dr. [name] on June 15 and her back to see Dr. [name] on August 19. She is scheduled for a bone density study on June 10.  Physical examination: Alert and oriented. Filed Vitals:   08/18/13 1041  BP: 145/70  Pulse: 68  Temp: 98.4 F (36.9 C)  Resp: 20   Head and neck examination: Grossly unremarkable. Nodes: Without palpable cervical, supraclavicular, or axillary lymphadenopathy. Breasts: There is mild thickening of the right breast with residual hyperpigmentation and patchy dry desquamation. No dominant masses are appreciated. Left breast without masses or lesions. Extremities: There is limited right shoulder extension secondary to pain. There is palpable discomfort along the anterior shoulder. Neurovascular intact.  Impression: Satisfactory progress from a breast cancer standpoint. She should have mammography in November or December of this year at the [facility]. She'll continue with her Arimidex and through Dr. [name]. I told the patient that Dr. Lynnae January will need to make a referral to an orthopedist for her right shoulder pain. I believe that she will need physical therapy to prevent a frozen shoulder. I'm not sure if this was precipitated by right shoulder extension during her course of radiation therapy. The patient will contact Dr. Lynnae January for a referral.  Plan: As above. Followup through Dr. [name] and Dr. [name].

## 2013-08-18 NOTE — Progress Notes (Signed)
Pt denies pain, fatigue, loss of appetite. She states her skin of right breast "is almost completely healed with one red spot". She is taking Arimidex daily. She is scheduled for a bone density test on 08/26/13.

## 2013-08-18 NOTE — Progress Notes (Signed)
I sent message to front desk to sch acute appt to address her R shoulder pain and get referral.

## 2013-08-20 ENCOUNTER — Ambulatory Visit (INDEPENDENT_AMBULATORY_CARE_PROVIDER_SITE_OTHER): Payer: Medicare Other | Admitting: Internal Medicine

## 2013-08-20 ENCOUNTER — Encounter: Payer: Self-pay | Admitting: Internal Medicine

## 2013-08-20 VITALS — BP 133/64 | HR 69 | Temp 97.7°F | Ht 64.0 in | Wt 192.8 lb

## 2013-08-20 DIAGNOSIS — M25511 Pain in right shoulder: Secondary | ICD-10-CM

## 2013-08-20 DIAGNOSIS — M25519 Pain in unspecified shoulder: Secondary | ICD-10-CM

## 2013-08-20 DIAGNOSIS — E1165 Type 2 diabetes mellitus with hyperglycemia: Principal | ICD-10-CM

## 2013-08-20 DIAGNOSIS — I10 Essential (primary) hypertension: Secondary | ICD-10-CM

## 2013-08-20 DIAGNOSIS — D649 Anemia, unspecified: Secondary | ICD-10-CM

## 2013-08-20 DIAGNOSIS — I2510 Atherosclerotic heart disease of native coronary artery without angina pectoris: Secondary | ICD-10-CM

## 2013-08-20 DIAGNOSIS — E119 Type 2 diabetes mellitus without complications: Secondary | ICD-10-CM

## 2013-08-20 DIAGNOSIS — E1129 Type 2 diabetes mellitus with other diabetic kidney complication: Secondary | ICD-10-CM

## 2013-08-20 DIAGNOSIS — J302 Other seasonal allergic rhinitis: Secondary | ICD-10-CM

## 2013-08-20 DIAGNOSIS — E785 Hyperlipidemia, unspecified: Secondary | ICD-10-CM

## 2013-08-20 LAB — GLUCOSE, CAPILLARY: Glucose-Capillary: 113 mg/dL — ABNORMAL HIGH (ref 70–99)

## 2013-08-20 LAB — POCT GLYCOSYLATED HEMOGLOBIN (HGB A1C): Hemoglobin A1C: 10.9

## 2013-08-20 MED ORDER — ATENOLOL 50 MG PO TABS: ORAL_TABLET | ORAL | Status: DC

## 2013-08-20 MED ORDER — COLESEVELAM HCL 625 MG PO TABS: 1875.0000 mg | ORAL_TABLET | Freq: Every day | ORAL | Status: DC

## 2013-08-20 MED ORDER — FUROSEMIDE 20 MG PO TABS: ORAL_TABLET | ORAL | Status: DC

## 2013-08-20 MED ORDER — CLOPIDOGREL BISULFATE 75 MG PO TABS: 75.0000 mg | ORAL_TABLET | Freq: Once | ORAL | Status: DC

## 2013-08-20 MED ORDER — INSULIN GLARGINE 100 UNIT/ML ~~LOC~~ SOLN: 36.0000 [IU] | Freq: Every day | SUBCUTANEOUS | Status: DC

## 2013-08-20 MED ORDER — AMLODIPINE BESYLATE 10 MG PO TABS: 10.0000 mg | ORAL_TABLET | Freq: Every day | ORAL | Status: DC

## 2013-08-20 MED ORDER — ATORVASTATIN CALCIUM 20 MG PO TABS: 20.0000 mg | ORAL_TABLET | Freq: Every day | ORAL | Status: DC

## 2013-08-20 MED ORDER — INSULIN LISPRO 100 UNIT/ML ~~LOC~~ SOLN: SUBCUTANEOUS | Status: DC

## 2013-08-20 MED ORDER — CYANOCOBALAMIN 1000 MCG/ML IJ SOLN: 1000.0000 ug | Freq: Once | INTRAMUSCULAR | Status: DC

## 2013-08-20 MED ORDER — ZOLPIDEM TARTRATE 5 MG PO TABS: 5.0000 mg | ORAL_TABLET | Freq: Every evening | ORAL | Status: DC | PRN

## 2013-08-20 MED ORDER — NITROGLYCERIN 0.4 MG SL SUBL: 0.4000 mg | SUBLINGUAL_TABLET | SUBLINGUAL | Status: DC | PRN

## 2013-08-20 MED ORDER — MOMETASONE FUROATE 50 MCG/ACT NA SUSP: 2.0000 | Freq: Every day | NASAL | Status: DC

## 2013-08-20 MED ORDER — LOPERAMIDE HCL 2 MG PO CAPS: 2.0000 mg | ORAL_CAPSULE | ORAL | Status: DC

## 2013-08-20 MED ORDER — POTASSIUM CHLORIDE ER 10 MEQ PO TBCR: 20.0000 meq | EXTENDED_RELEASE_TABLET | Freq: Every day | ORAL | Status: DC

## 2013-08-20 MED ORDER — LISINOPRIL 10 MG PO TABS: 10.0000 mg | ORAL_TABLET | Freq: Every day | ORAL | Status: DC

## 2013-08-20 MED ORDER — FENOFIBRATE 48 MG PO TABS: 48.0000 mg | ORAL_TABLET | Freq: Every day | ORAL | Status: DC

## 2013-08-20 MED ORDER — HYDROXYZINE PAMOATE 50 MG PO CAPS: 50.0000 mg | ORAL_CAPSULE | Freq: Every evening | ORAL | Status: DC | PRN

## 2013-08-20 NOTE — Patient Instructions (Signed)
Start taking Lantus insulin 36 units subcutaneously at bedtime. Check your blood sugars two times daily - either before breakfast, before lunch, before dinner, or at bedtime for the next 4 weeks.  Bring your glucometer to your next office visit. Follow up with Orthopedics as recommended.

## 2013-08-20 NOTE — Assessment & Plan Note (Signed)
Uncontrolled with an A1C of 10.9. Unclear for worsening of her diabetes (from 6.9 to 10.9). ?Medication compliance. Elevated fasting blood sugars ranging from 200's-300's with one elevated of before lunch and before dinner. Discussed with the attending regarding further management and plan.  Plans: Increase Lantus to 36 units qhs. Continue mealtime coverage at 3 units before lunch and 3 units before dinner. Recommended to check blood sugars 2 times daily - either before breakfast, lunch, dinner or at bedtime. Follow up in 3 weeks.

## 2013-08-20 NOTE — Assessment & Plan Note (Signed)
Right shoulder pain of 3 weeks duration. Presentation and clinical suggestive of right rotator cuff tendinopathy vs radiation induced injury of the shoulder joint ligaments/tendons. In the setting of recent diagnosis of right breast cancer, s/p lumpectomy and sentil LN biopsy, s/p radiation therapy. Discussed with the attending regarding further management.  Plans: Refer to Orthopedics for further evaluation and management.

## 2013-08-20 NOTE — Assessment & Plan Note (Signed)
Well controlled.  Plans: Continue current regimen.

## 2013-08-20 NOTE — Progress Notes (Signed)
Subjective:   Patient ID: [name] female   DOB: 05-Dec-1945 68 y.o.   MRN: [id number]  HPI: [name] is a 68 y.o. woman with PMH significant for HTN, DM, HLD, CAD, CKD, Breast cancer diagnosed in 02/2013 s/p lumpectomy, Radiation therapy, currently on Anastrozole therapy comes to the office for the management of her diabetes and right shoulder pain x 3weeks.  1. DM: Patient reports that her blood sugars have been elevated recently and that some of her numbers are in 300's. Patient reports using Lantus 32 units qhs and 3 units of short acting insuling before lunch and supper. She reports compliance to her regimen and watching her diet but not sure why her blood sugars are elevated. She brings her glucometer to the office it has values from 07/30/13. All of her values are fasting blood sugars ranging from 200's to 300's range. Lowest fasting blood sugar is 134 and highest is 371. Patient has one before lunch value (568) and one before dinner (460).   2. Patient is complaining of right shoulder pain of about 3 weeks duration. Pain started when she started her radiation therapy during which she has to position her right arm behind her head. Patient states that the pain is constant, worsened by moving the right shoulder and relieved by resting. Patient denies any tingling, numbness, weakness. Patients pain is located inside her right shoulder, sometimes radiating to the lateral aspect of deltoid and along with upper arm to the fingers. Patient denies any trauma or surgeries to the joint. Patient reports taking aleve without much relief.  She is asking for a prescription refills of all her medications printed so she can take them to [place].   She denies any other complaints.   Past Medical History  Diagnosis Date  . Hypertension     2006 B renal arteries patent. 2003 MRA - no RAS. 2003 pheo W/U Dr Hassell Done reportedly negative.  Marland Kitchen CAD (coronary artery disease) 1996    1996 - PTCA and  angioplasty diagonal branch. 2000 - Rotoblator & angiopllasty of diagonal. 2006 - subendocardial AMI, DES to proximal LAD.Marland Kitchen Also had 90% stenosis in distal apical LAD. EF 55 with apical hypokinesis. Indefinite ASA and Plavix.  . Abdominal discomfort     Chronic N/V/D. Presumptive dx Crohn's dx per elevated p ANCA. Failed Entocort and Pentasa. Sep 2003 - ileocolectomy c anastomosis per Dr Deon Pilling 2/2 adhesions - path was hegative for Crohns. EGD, Sm bowel follow through (11/03), and an eteroclysis (10/03) were unrevealing. Cuases hypomag and hypocalcemia.  . Anemia     Multifactorial. Baseline HgB 10-11 ish. B12 def - 150 in 3/10. Fe Def - ferritin 35 3/10. Both are being repleted.  . Stroke     Incidental finding MRI 2002 L lacunar infarct  . Diabetes mellitus     Insulin dependent  . Allergy     Seasonal  . Chronic pain     CT 10/10 = Spinal stenosis L2 - S1.  Marland Kitchen Chronic kidney disease     Chronic renal insuff baseline Cr 1.2 - 1.4 ish.  . Adnexal mass 8/03    s/p lap BSO (R ovarian fibroma) & lysis of adhesions  . Hyperlipidemia     Managed with both a statin and Welchol. Welchol stopped 2014 2/2 cost and started on fenofibrate   . Wears dentures     top  . RBBB   . Breast cancer 03/16/13    right, 5 o'clock  .  Hx of radiation therapy 06/02/13- 07/16/13    right rbeast 4500 cGy 25 sessions, right breast boost 1600 cGy in 8 sessions   Current Outpatient Prescriptions  Medication Sig Dispense Refill  . 1st Choice Lancets Thin MISC by Does not apply route 2 (two) times daily.        Marland Kitchen amLODipine (NORVASC) 10 MG tablet Take 1 tablet (10 mg total) by mouth daily.  90 tablet  3  . anastrozole (ARIMIDEX) 1 MG tablet Take 1 tablet (1 mg total) by mouth daily.  30 tablet  5  . aspirin 81 MG tablet Take 1 tablet (81 mg total) by mouth daily.  90 tablet  3  . atenolol (TENORMIN) 50 MG tablet TAKE ONE TABLET BY MOUTH TWICE DAILY  180 tablet  3  . atorvastatin (LIPITOR) 20 MG tablet Take 1 tablet  (20 mg total) by mouth at bedtime.  90 tablet  3  . clopidogrel (PLAVIX) 75 MG tablet TAKE ONE (1) TABLET EACH DAY  90 tablet  3  . cyanocobalamin (,VITAMIN B-12,) 1000 MCG/ML injection Inject 1 mL (1,000 mcg total) into the muscle every 30 (thirty) days.  10 mL  1  . desloratadine (CLARINEX) 5 MG tablet       . fenofibrate (TRICOR) 48 MG tablet Take 1 tablet (48 mg total) by mouth daily.  30 tablet  11  . ferrous fumarate (HEMOCYTE) 325 (106 FE) MG TABS tablet Take 1 tablet (106 mg of iron total) by mouth daily.  30 each  0  . ferrous sulfate 325 (65 FE) MG tablet Take 1 tablet (325 mg total) by mouth 3 (three) times daily with meals.  270 tablet  3  . furosemide (LASIX) 20 MG tablet TAKE ONE TABLET BY MOUTH TWICE DAILY  180 tablet  3  . glucose blood (ONE TOUCH ULTRA TEST) test strip 1 each by Other route 2 (two) times daily. icd 9 250.00  100 each  11  . hydrOXYzine (VISTARIL) 50 MG capsule Take 1 capsule (50 mg total) by mouth at bedtime as needed.  90 capsule  3  . insulin glargine (LANTUS) 100 UNIT/ML injection Inject 0.3 mLs (30 Units total) into the skin at bedtime.  30 mL  3  . insulin lispro (HUMALOG) 100 UNIT/ML injection Inject 3 units before lunch and dinner. DIAG. CODE 250.42. INSULIN DEPENDENT  10 mL  3  . Insulin Syringe-Needle U-100 (INSULIN SYRINGE 1CC/28G) 28G X 1/2" 1 ML MISC by Does not apply route.        Marland Kitchen lisinopril (PRINIVIL,ZESTRIL) 10 MG tablet Take 1 tablet (10 mg total) by mouth daily.  90 tablet  3  . loperamide (IMODIUM) 2 MG capsule Take 2 mg by mouth 1 day or 1 dose.      . mometasone (NASONEX) 50 MCG/ACT nasal spray Place 2 sprays into the nose daily.  51 g  3  . nitroGLYCERIN (NITROSTAT) 0.4 MG SL tablet Place 1 tablet (0.4 mg total) under the tongue every 5 (five) minutes as needed.  90 tablet  3  . nystatin (MYCOSTATIN/NYSTOP) 100000 UNIT/GM POWD Use as directed 2 times daily  1 Bottle  0  . potassium chloride (K-DUR) 10 MEQ tablet Take 2 tablets (20 mEq total)  by mouth daily.  60 tablet  11  . WELCHOL 625 MG tablet       . Wound Cleansers (RADIAPLEX EX) Apply topically.      Marland Kitchen zolpidem (AMBIEN) 5 MG tablet Take 1 tablet (5  mg total) by mouth at bedtime as needed for sleep.  30 tablet  0  . zoster vaccine live, PF, (ZOSTAVAX) 63149 UNT/0.65ML injection Inject 19,400 Units into the skin once.  1 each  0   Current Facility-Administered Medications  Medication Dose Route Frequency Provider Last Rate Last Dose  . cyanocobalamin ((VITAMIN B-12)) injection 1,000 mcg  1,000 mcg Intramuscular Once Carter Kitten, MD       Family History  Problem Relation Age of Onset  . Pancreatic cancer Brother 11  . Cancer Mother   . Breast cancer Mother 53  . Hypertension Daughter   . Lung cancer Maternal Aunt   . Prostate cancer Maternal Uncle   . Heart attack Maternal Grandmother   . Kidney failure Maternal Aunt    History   Social History  . Marital Status: Legally Separated    Spouse Name: N/A    Number of Children: 4  . Years of Education: N/A   Occupational History  .     Social History Main Topics  . Smoking status: Never Smoker   . Smokeless tobacco: Never Used  . Alcohol Use: No  . Drug Use: No  . Sexual Activity: Yes   Other Topics Concern  . None   Social History Narrative  . None   Review of Systems: Pertinent items are noted in HPI. Objective:  Physical Exam: Filed Vitals:   08/20/13 0828  BP: 133/64  Pulse: 69  Temp: 97.7 F (36.5 C)  TempSrc: Oral  Height: 5' 4"  (1.626 m)  Weight: 192 lb 12.8 oz (87.454 kg)  SpO2: 99%   Constitutional: Vital signs reviewed.  Patient is a well-developed and well-nourished and is in no acute distress and cooperative with exam. Alert and oriented x3.  Head: Normocephalic and atraumatic Cardiovascular: RRR, S1 normal, S2 normal, no MRG, pulses symmetric and intact bilaterally Pulmonary/Chest: normal respiratory effort, CTAB, no wheezes, rales, or rhonchi Extremities: Trace pedal  edema. Right shoulder: No visible swelling or deformity noted. No TTP over the [facility] joint, clavicle or greater tubercle or over the acromion. Mild TTP over the lateral aspect of the deltoid. Active flexion possible upto 30-45 degrees, active extension upto 30-45 degrees, active abduction upto 75 degrees, beyond which pain is elicited upon active or passive flexion/extension/abduction. Painful arc sign elicited beyond 60 degrees of abduction. Drop arm test demonstrated inability to smoothly control the adduction. Painful and weak external rotation is also note. Mild palpable crepitus felt upon passive abduction at around 45 degrees.  Neurological: A&O x3. Skin: Warm, dry and intact. Psychiatric: Normal mood and affect.   Assessment & Plan:

## 2013-08-21 NOTE — Progress Notes (Signed)
Case discussed with Dr. Eyvonne Mechanic at the time of the visit.  We reviewed the resident's history and exam and pertinent patient test results.  I agree with the assessment, diagnosis, and plan of care documented in the resident's note.

## 2013-08-26 ENCOUNTER — Ambulatory Visit
Admission: RE | Admit: 2013-08-26 | Discharge: 2013-08-26 | Disposition: A | Discharge status: Home / Self Care | Payer: Medicare Other | Source: Ambulatory Visit | Attending: Oncology | Admitting: Oncology

## 2013-08-26 DIAGNOSIS — C50311 Malignant neoplasm of lower-inner quadrant of right female breast: Secondary | ICD-10-CM

## 2013-08-27 ENCOUNTER — Encounter: Payer: Self-pay | Admitting: Internal Medicine

## 2013-08-31 ENCOUNTER — Encounter (INDEPENDENT_AMBULATORY_CARE_PROVIDER_SITE_OTHER): Payer: Self-pay | Admitting: General Surgery

## 2013-08-31 ENCOUNTER — Ambulatory Visit (INDEPENDENT_AMBULATORY_CARE_PROVIDER_SITE_OTHER): Payer: Medicare Other | Admitting: General Surgery

## 2013-08-31 VITALS — BP 126/80 | HR 80 | Temp 97.9°F | Ht 64.0 in | Wt 191.0 lb

## 2013-08-31 DIAGNOSIS — C50319 Malignant neoplasm of lower-inner quadrant of unspecified female breast: Secondary | ICD-10-CM

## 2013-08-31 DIAGNOSIS — C50311 Malignant neoplasm of lower-inner quadrant of right female breast: Secondary | ICD-10-CM

## 2013-08-31 NOTE — Patient Instructions (Addendum)
Follow up in 6 months with me.  Follow up in August with oncology.  Get mammogram at 1 year.    Continue arimedex.

## 2013-08-31 NOTE — Progress Notes (Signed)
HISTORY: Pt is s/p right lumpectomy/sln bx for right breast cancer in 04/2013.  She completed radiation around 3 weeks ago.  She is doing well.  She tolerated this fine.  She is on arimedex at the direction of Dr. [name].  She is having some shoulder pain on the right.  She is otherwise well.     PERTINENT REVIEW OF SYSTEMS: O/w neg x 11.    Filed Vitals:   08/31/13 1114  BP: 126/80  Pulse: 80  Temp: 97.9 F (36.6 C)   Wt Readings from Last 3 Encounters:  08/31/13 191 lb (86.637 kg)  08/20/13 192 lb 12.8 oz (87.454 kg)  08/18/13 190 lb 9.6 oz (86.456 kg)    EXAM: Head: Normocephalic and atraumatic.  Eyes:  Conjunctivae are normal. Pupils are equal, round, and reactive to light. No scleral icterus.  Neck:  Normal range of motion. Neck supple. No tracheal deviation present. No thyromegaly present.  Resp: No respiratory distress, normal effort. Breast:  No palpable masses or skin dimpling.  Slight retraction of incision on lower right breast.  Some skin changes on right from radiation.  No LAD.   Abd:  Abdomen is soft, non distended and non tender. No masses are palpable.  There is no rebound and no guarding.  Neurological: Alert and oriented to person, place, and time. Coordination normal.  Skin: Skin is warm and dry. No rash noted. No diaphoretic. No erythema. No pallor.  Psychiatric: Normal mood and affect. Normal behavior. Judgment and thought content normal.      ASSESSMENT AND PLAN:   Breast cancer of lower-inner quadrant of right female breast Follow up in 6 months.   Continue arimedex.  Follow up with Dr. [name] in august.          Milus Height, MD Surgical Oncology, [facility]  Larey Dresser, MD Bartholomew Crews, MD

## 2013-08-31 NOTE — Assessment & Plan Note (Addendum)
Follow up in 6 months.   Continue arimedex.  Follow up with Dr. [name] in august.

## 2013-09-02 ENCOUNTER — Telehealth: Payer: Self-pay | Admitting: *Deleted

## 2013-09-02 NOTE — Telephone Encounter (Signed)
Call from patient this am received a Cortisone injection on yesterday.  Glucose on last check was 580.  Sugars have been in the 190's previously.  Wants to know what she will need to do to lower the Glucose.  Message to Dr. Lynnae January.  Sander Nephew, RN 09/02/2013 8:27 AM

## 2013-09-02 NOTE — Telephone Encounter (Signed)
[name] agreed to call and explain correction Novolog dosing. Will forward.

## 2013-09-02 NOTE — Telephone Encounter (Signed)
Called patient to help her with using a correction dose of her fast acting insulin to address her high blood sugars. Total daily dose is 42 (36 basal and 6 bolus) correction dose is ~43 (1800/42). Gave her scale using correction of 40 to make math easier. Patient will likley need new Rx for Humalog to include the extra insulin she uses to correct her blood sugars.  She verbalized understanding by giving several correct responses to examples of how to use the correction scale. To be added to her meal time doses and not to be used more than three times a day: If CBG is :  < 150 = +0 units Humalog 151-190 = +1 units 191-230 = +2 units 231-270 = +3 units 271-310 = +4 units 311-350 = + 5 units 351-390= +6 units 391-430 =+ 7 units 431-470 = + 8 units 471-510= + 9 units > 510= +10 and call office

## 2013-09-03 ENCOUNTER — Other Ambulatory Visit: Payer: Self-pay | Admitting: *Deleted

## 2013-09-03 ENCOUNTER — Telehealth: Payer: Self-pay | Admitting: *Deleted

## 2013-09-03 MED ORDER — FERROUS FUMARATE 325 (106 FE) MG PO TABS: 1.0000 | ORAL_TABLET | Freq: Every day | ORAL | Status: DC

## 2013-09-03 NOTE — Telephone Encounter (Signed)
Message copied by Verlon Setting on Thu Sep 03, 2013 10:32 AM ------      Message from: Evlyn Clines P      Created: Thu Sep 03, 2013  7:53 AM       Labs seen and need follow up: please let her know bone density was normal, which is great! Ask how she is doing with the new Arimidex   Cc LA, TH, AM ------

## 2013-09-03 NOTE — Telephone Encounter (Signed)
Patient calling for refill, her insurance did cover Iron, and she is tolerating without any issues of constipation or gastric upset.

## 2013-09-03 NOTE — Telephone Encounter (Signed)
Called patient and informed her that Bone Density is normal and that is great news. She has not experienced any side effects of the Arimidex at this time. She informed me she had requested refill on iron, no documentation showing this in refill encounters. Patient is doing fine on iron without any signs of constipation. Refills submitted. Patient understands to call us back for any problems she may have before her next office visit 10/2013.

## 2013-09-09 ENCOUNTER — Encounter: Payer: Self-pay | Admitting: Cardiovascular Disease

## 2013-09-09 ENCOUNTER — Ambulatory Visit (INDEPENDENT_AMBULATORY_CARE_PROVIDER_SITE_OTHER): Payer: Medicare Other | Admitting: Cardiovascular Disease

## 2013-09-09 VITALS — BP 132/64 | HR 68 | Ht 64.0 in | Wt 193.0 lb

## 2013-09-09 DIAGNOSIS — I10 Essential (primary) hypertension: Secondary | ICD-10-CM

## 2013-09-09 DIAGNOSIS — I2510 Atherosclerotic heart disease of native coronary artery without angina pectoris: Secondary | ICD-10-CM

## 2013-09-09 DIAGNOSIS — I209 Angina pectoris, unspecified: Secondary | ICD-10-CM

## 2013-09-09 DIAGNOSIS — E785 Hyperlipidemia, unspecified: Secondary | ICD-10-CM

## 2013-09-09 DIAGNOSIS — I25119 Atherosclerotic heart disease of native coronary artery with unspecified angina pectoris: Secondary | ICD-10-CM

## 2013-09-09 NOTE — Assessment & Plan Note (Signed)
Well controlled.  Continue current medications and low sodium Dash type diet.   No RAS or pheo previous renal w/u

## 2013-09-09 NOTE — Progress Notes (Signed)
Patient ID: [name], female   DOB: [birth year], 68 y.o.   MRN: [id number] [name] is seen today for F/U of CAD with pevious rotoblator to the LAD in 2000. Her last myovue on 11/2006 showed mild apical thinning and was low risk. She has not had any SSCP and has been compliant with her meds.Her BP is alwyas hard to Rx and she is on multiple drugs with persistant mild tachycardia. She denies dyspnea, PND orthopnea. Has some swelling in her feet. Diet still an issue with her DM. Had a nice trip to [name] with her daughter last month  Discussed Accelerate Trial with her and she declined. ON lipitor with HDL 39 and LDL 31   Seeing Dr [name] for XRT breast CA doing well    ROS: Denies fever, malais, weight loss, blurry vision, decreased visual acuity, cough, sputum, SOB, hemoptysis, pleuritic pain, palpitaitons, heartburn, abdominal pain, melena, lower extremity edema, claudication, or rash.  All other systems reviewed and negative  General: Affect appropriate Healthy:  appears stated age 68: normal Neck supple with no adenopathy JVP normal no bruits no thyromegaly Lungs clear with no wheezing and good diaphragmatic motion Heart:  S1/S2 no murmur, no rub, gallop or click PMI normal Abdomen: benighn, BS positve, no tenderness, no AAA no bruit.  No HSM or HJR Distal pulses intact with no bruits No edema Neuro non-focal Skin warm and dry No muscular weakness   Current Outpatient Prescriptions  Medication Sig Dispense Refill  . 1st Choice Lancets Thin MISC by Does not apply route 2 (two) times daily.        Marland Kitchen amLODipine (NORVASC) 10 MG tablet Take 1 tablet (10 mg total) by mouth daily.  90 tablet  3  . anastrozole (ARIMIDEX) 1 MG tablet Take 1 tablet (1 mg total) by mouth daily.  30 tablet  5  . aspirin 81 MG tablet Take 1 tablet (81 mg total) by mouth daily.  90 tablet  3  . atenolol (TENORMIN) 50 MG tablet TAKE ONE TABLET BY MOUTH TWICE DAILY  180 tablet  3  . atorvastatin (LIPITOR)  20 MG tablet Take 1 tablet (20 mg total) by mouth at bedtime.  90 tablet  3  . clopidogrel (PLAVIX) 75 MG tablet Take 1 tablet (75 mg total) by mouth once.  90 tablet  3  . colesevelam (WELCHOL) 625 MG tablet Take 3 tablets (1,875 mg total) by mouth daily with breakfast.  90 tablet  3  . cyanocobalamin (,VITAMIN B-12,) 1000 MCG/ML injection Inject 1 mL (1,000 mcg total) into the muscle every 30 (thirty) days.  10 mL  1  . desloratadine (CLARINEX) 5 MG tablet as needed.       . fenofibrate (TRICOR) 48 MG tablet Take 1 tablet (48 mg total) by mouth daily.  30 tablet  3  . ferrous fumarate (HEMOCYTE) 325 (106 FE) MG TABS tablet Take 1 tablet (106 mg of iron total) by mouth daily.  30 each  2  . furosemide (LASIX) 20 MG tablet TAKE ONE TABLET BY MOUTH TWICE DAILY  90 tablet  3  . glucose blood (ONE TOUCH ULTRA TEST) test strip 1 each by Other route 2 (two) times daily. icd 9 250.00  100 each  11  . hydrOXYzine (VISTARIL) 50 MG capsule Take 1 capsule (50 mg total) by mouth at bedtime as needed.  90 capsule  3  . insulin glargine (LANTUS) 100 UNIT/ML injection Inject 0.36 mLs (36 Units total) into the skin  at bedtime.  30 mL  3  . insulin lispro (HUMALOG) 100 UNIT/ML injection Inject 3 units before lunch and dinner. DIAG. CODE 250.42. INSULIN DEPENDENT  10 mL  3  . Insulin Syringe-Needle U-100 (INSULIN SYRINGE 1CC/28G) 28G X 1/2" 1 ML MISC by Does not apply route.        Marland Kitchen lisinopril (PRINIVIL,ZESTRIL) 10 MG tablet Take 1 tablet (10 mg total) by mouth daily.  90 tablet  3  . loperamide (IMODIUM) 2 MG capsule Take 1 capsule (2 mg total) by mouth 1 day or 1 dose. As needed for diarrhea  30 capsule  3  . mometasone (NASONEX) 50 MCG/ACT nasal spray Place 2 sprays into the nose as needed.      . nitroGLYCERIN (NITROSTAT) 0.4 MG SL tablet Place 1 tablet (0.4 mg total) under the tongue every 5 (five) minutes as needed.  90 tablet  3  . potassium chloride (K-DUR) 10 MEQ tablet Take 2 tablets (20 mEq total) by  mouth daily.  60 tablet  3  . zolpidem (AMBIEN) 5 MG tablet Take 1 tablet (5 mg total) by mouth at bedtime as needed for sleep.  30 tablet  3  . zoster vaccine live, PF, (ZOSTAVAX) 59741 UNT/0.65ML injection Inject 19,400 Units into the skin once.  1 each  0   No current facility-administered medications for this visit.    Allergies  Percocet; Diazepam; Haloperidol lactate; Lorazepam; Morphine sulfate; Propoxyphene hcl; and Tramadol hcl  Electrocardiogram:  Assessment and Plan

## 2013-09-09 NOTE — Patient Instructions (Signed)
Your physician wants you to follow-up in:    [name] will receive a reminder letter in the mail two months in advance. If you don't receive a letter, please call our office to schedule the follow-up appointment. Your physician recommends that you continue on your current medications as directed. Please refer to the Current Medication list given to you today.

## 2013-09-09 NOTE — Assessment & Plan Note (Signed)

## 2013-09-09 NOTE — Assessment & Plan Note (Signed)
Stable with no angina and good activity level.  Continue medical Rx  

## 2013-09-10 ENCOUNTER — Ambulatory Visit (INDEPENDENT_AMBULATORY_CARE_PROVIDER_SITE_OTHER): Payer: Medicare Other | Admitting: Internal Medicine

## 2013-09-10 ENCOUNTER — Encounter: Payer: Self-pay | Admitting: Internal Medicine

## 2013-09-10 VITALS — BP 126/61 | HR 67 | Temp 98.4°F | Ht 64.0 in | Wt 196.3 lb

## 2013-09-10 DIAGNOSIS — N2889 Other specified disorders of kidney and ureter: Secondary | ICD-10-CM

## 2013-09-10 DIAGNOSIS — I151 Hypertension secondary to other renal disorders: Secondary | ICD-10-CM

## 2013-09-10 DIAGNOSIS — E1129 Type 2 diabetes mellitus with other diabetic kidney complication: Secondary | ICD-10-CM

## 2013-09-10 DIAGNOSIS — E1165 Type 2 diabetes mellitus with hyperglycemia: Principal | ICD-10-CM

## 2013-09-10 DIAGNOSIS — I129 Hypertensive chronic kidney disease with stage 1 through stage 4 chronic kidney disease, or unspecified chronic kidney disease: Secondary | ICD-10-CM

## 2013-09-10 DIAGNOSIS — I150 Renovascular hypertension: Secondary | ICD-10-CM

## 2013-09-10 LAB — GLUCOSE, CAPILLARY: Glucose-Capillary: 168 mg/dL — ABNORMAL HIGH (ref 70–99)

## 2013-09-10 MED ORDER — INSULIN LISPRO 100 UNIT/ML ~~LOC~~ SOLN: SUBCUTANEOUS | Status: DC

## 2013-09-10 NOTE — Assessment & Plan Note (Signed)
Recently started on TID short acting insulin with SSI and blood sugars have been improving gradually. With the Steroid effect weaning off (received on 09/02/13 per patient), expect her blood sugars improve better. Discussed with the attending regarding further management and plan.  Plans: Continue Humalog Insulin three times daily with meals with meal time coverage and correction for the pre-meal value. Instruction giver to the patient regarding how much to take. Patient understands the instructions and has been doing very well as instructed. No changes in Lantus insulin at this time.

## 2013-09-10 NOTE — Progress Notes (Signed)
Subjective:   Patient ID: [name] female   DOB: 11-Sep-1945 68 y.o.   MRN: [id number]  HPI: [name] is a 68 y.o. woman with PMH significant for HTN, DM, HLD, CAD, CKD, Breast cancer diagnosed in 02/2013 s/p lumpectomy, Radiation therapy, currently on Anastrozole therapy comes to the office for the follow up of her DM.  I saw [name] on 08/20/13 for diabetes management and right shoulder pain. Patient was referred to orthopedics and was instructed to check her CBG's and a follow up in 3 weeks.  Patient brings her glucometer to the office visit and most of the fasting blood sugar values are in low 200's with pre-lunch, pre-dinner values ranging in 200-300 range most of the times. Patient apparently received steroid injection to her right shoulder in her orthopedist office and since then her blood sugars were slightly elevated in 400-500's range. Patient called the clinic and [name] plyler recommended to take her SA insulin three times daily with meal time coverage and Sliding scale. Patient was instructed on how to do the SSI and has been doing very well. Patient blood sugars improved gradually ever since she was switched to TID short acting insulin.  She denies any other complaints.  Past Medical History  Diagnosis Date  . Hypertension     2006 B renal arteries patent. 2003 MRA - no RAS. 2003 pheo W/U Dr Hassell Done reportedly negative.  Marland Kitchen CAD (coronary artery disease) 1996    1996 - PTCA and angioplasty diagonal branch. 2000 - Rotoblator & angiopllasty of diagonal. 2006 - subendocardial AMI, DES to proximal LAD.Marland Kitchen Also had 90% stenosis in distal apical LAD. EF 55 with apical hypokinesis. Indefinite ASA and Plavix.  . Abdominal discomfort     Chronic N/V/D. Presumptive dx Crohn's dx per elevated p ANCA. Failed Entocort and Pentasa. Sep 2003 - ileocolectomy c anastomosis per Dr Deon Pilling 2/2 adhesions - path was hegative for Crohns. EGD, Sm bowel follow through (11/03), and an eteroclysis  (10/03) were unrevealing. Cuases hypomag and hypocalcemia.  . Anemia     Multifactorial. Baseline HgB 10-11 ish. B12 def - 150 in 3/10. Fe Def - ferritin 35 3/10. Both are being repleted.  . Stroke     Incidental finding MRI 2002 L lacunar infarct  . Diabetes mellitus     Insulin dependent  . Allergy     Seasonal  . Chronic pain     CT 10/10 = Spinal stenosis L2 - S1.  Marland Kitchen Chronic kidney disease     Chronic renal insuff baseline Cr 1.2 - 1.4 ish.  . Adnexal mass 8/03    s/p lap BSO (R ovarian fibroma) & lysis of adhesions  . Hyperlipidemia     Managed with both a statin and Welchol. Welchol stopped 2014 2/2 cost and started on fenofibrate   . Wears dentures     top  . RBBB   . Breast cancer 03/16/13    right, 5 o'clock  . Hx of radiation therapy 06/02/13- 07/16/13    right rbeast 4500 cGy 25 sessions, right breast boost 1600 cGy in 8 sessions   Current Outpatient Prescriptions  Medication Sig Dispense Refill  . 1st Choice Lancets Thin MISC by Does not apply route 2 (two) times daily.        Marland Kitchen amLODipine (NORVASC) 10 MG tablet Take 1 tablet (10 mg total) by mouth daily.  90 tablet  3  . anastrozole (ARIMIDEX) 1 MG tablet Take 1 tablet (1 mg total)  by mouth daily.  30 tablet  5  . aspirin 81 MG tablet Take 1 tablet (81 mg total) by mouth daily.  90 tablet  3  . atenolol (TENORMIN) 50 MG tablet TAKE ONE TABLET BY MOUTH TWICE DAILY  180 tablet  3  . atorvastatin (LIPITOR) 20 MG tablet Take 1 tablet (20 mg total) by mouth at bedtime.  90 tablet  3  . clopidogrel (PLAVIX) 75 MG tablet Take 1 tablet (75 mg total) by mouth once.  90 tablet  3  . colesevelam (WELCHOL) 625 MG tablet Take 3 tablets (1,875 mg total) by mouth daily with breakfast.  90 tablet  3  . cyanocobalamin (,VITAMIN B-12,) 1000 MCG/ML injection Inject 1 mL (1,000 mcg total) into the muscle every 30 (thirty) days.  10 mL  1  . desloratadine (CLARINEX) 5 MG tablet as needed.       . fenofibrate (TRICOR) 48 MG tablet Take 1  tablet (48 mg total) by mouth daily.  30 tablet  3  . ferrous fumarate (HEMOCYTE) 325 (106 FE) MG TABS tablet Take 1 tablet (106 mg of iron total) by mouth daily.  30 each  2  . furosemide (LASIX) 20 MG tablet TAKE ONE TABLET BY MOUTH TWICE DAILY  90 tablet  3  . glucose blood (ONE TOUCH ULTRA TEST) test strip 1 each by Other route 2 (two) times daily. icd 9 250.00  100 each  11  . hydrOXYzine (VISTARIL) 50 MG capsule Take 1 capsule (50 mg total) by mouth at bedtime as needed.  90 capsule  3  . insulin glargine (LANTUS) 100 UNIT/ML injection Inject 0.36 mLs (36 Units total) into the skin at bedtime.  30 mL  3  . insulin lispro (HUMALOG) 100 UNIT/ML injection Inject subcutaneously three times daily with breakfast, lunch and dinner. Take 3 units for meal coverage plus correction for your pre-meal blood sugar as instructed.  10 mL  3  . Insulin Syringe-Needle U-100 (INSULIN SYRINGE 1CC/28G) 28G X 1/2" 1 ML MISC by Does not apply route.        Marland Kitchen lisinopril (PRINIVIL,ZESTRIL) 10 MG tablet Take 1 tablet (10 mg total) by mouth daily.  90 tablet  3  . loperamide (IMODIUM) 2 MG capsule Take 1 capsule (2 mg total) by mouth 1 day or 1 dose. As needed for diarrhea  30 capsule  3  . mometasone (NASONEX) 50 MCG/ACT nasal spray Place 2 sprays into the nose as needed.      . nitroGLYCERIN (NITROSTAT) 0.4 MG SL tablet Place 1 tablet (0.4 mg total) under the tongue every 5 (five) minutes as needed.  90 tablet  3  . potassium chloride (K-DUR) 10 MEQ tablet Take 2 tablets (20 mEq total) by mouth daily.  60 tablet  3  . zolpidem (AMBIEN) 5 MG tablet Take 1 tablet (5 mg total) by mouth at bedtime as needed for sleep.  30 tablet  3  . zoster vaccine live, PF, (ZOSTAVAX) 29528 UNT/0.65ML injection Inject 19,400 Units into the skin once.  1 each  0   No current facility-administered medications for this visit.   Family History  Problem Relation Age of Onset  . Pancreatic cancer Brother 16  . Cancer Mother   . Breast  cancer Mother 86  . Hypertension Daughter   . Lung cancer Maternal Aunt   . Prostate cancer Maternal Uncle   . Heart attack Maternal Grandmother   . Kidney failure Maternal Aunt    History  Social History  . Marital Status: Legally Separated    Spouse Name: N/A    Number of Children: 4  . Years of Education: N/A   Occupational History  .     Social History Main Topics  . Smoking status: Never Smoker   . Smokeless tobacco: Never Used  . Alcohol Use: No  . Drug Use: No  . Sexual Activity: Yes   Other Topics Concern  . None   Social History Narrative  . None   Review of Systems: Pertinent items are noted in HPI. Objective:  Physical Exam: Filed Vitals:   09/10/13 0939  BP: 126/61  Pulse: 67  Temp: 98.4 F (36.9 C)  TempSrc: Oral  Height: 5' 4"  (1.626 m)  Weight: 196 lb 4.8 oz (89.041 kg)  SpO2: 99%   Constitutional: Vital signs reviewed. Patient is a well-developed and well-nourished and is in no acute distress and cooperative with exam.  Cardiovascular: RRR, S1 normal, S2 normal, no MRG Pulmonary/Chest: normal respiratory effort, CTAB, no wheezes, rales, or rhonchi  Right shoulder: ROM improved from my previous exam. Neurological: A&O x3.   Assessment & Plan:

## 2013-09-10 NOTE — Patient Instructions (Signed)
Continue Lantus 36 units subcutaneously, once daily at bedtime. Take Humalog insulin three times daily with breakfast, lunch and dinner. Take 3 units for mealtime coverage plus add correction insulin as per sliding scale instructions provided below. If the pre-meal blood sugar is :   <150 = 3 +0 units Humalog 151-190 = 3+1 units  191-230 = 3+2 units  231-270 = 3+3 units  271-310 = 3+4 units  311-350 = 3+5 units  351-390= 3+6 units  391-430 =3+7 units  431-470 = 3+8 units  471-510= 3+ 9 units  > 510= 3+10 and call office

## 2013-09-10 NOTE — Assessment & Plan Note (Signed)
Well controlled.  Plans: Continue current regimen.

## 2013-09-10 NOTE — Progress Notes (Signed)
Case discussed with Dr. Eyvonne Mechanic at the time of the visit.  We reviewed the resident's history and exam and pertinent patient test results.  I agree with the assessment, diagnosis, and plan of care documented in the resident's note.

## 2013-09-11 ENCOUNTER — Telehealth: Payer: Self-pay

## 2013-09-11 DIAGNOSIS — C50311 Malignant neoplasm of lower-inner quadrant of right female breast: Secondary | ICD-10-CM

## 2013-09-11 LAB — LIPID PANEL
CHOL/HDL RATIO: 3.8 ratio
Cholesterol: 165 mg/dL (ref 0–200)
HDL: 44 mg/dL (ref >39)
LDL CALC: 65 mg/dL (ref 0–99)
Triglycerides: 280 mg/dL — ABNORMAL HIGH (ref <150)
VLDL: 56 mg/dL — AB (ref 0–40)

## 2013-09-11 MED ORDER — ANASTROZOLE 1 MG PO TABS: 1.0000 mg | ORAL_TABLET | Freq: Every day | ORAL | Status: DC

## 2013-09-11 MED ORDER — FERROUS FUMARATE 325 (106 FE) MG PO TABS: 1.0000 | ORAL_TABLET | Freq: Every day | ORAL | Status: DC

## 2013-09-11 NOTE — Telephone Encounter (Signed)
[name] needs a 90 day  prescription for her Arimidex and Hemocyte  to mail to the [place]. She will pick up the prescriptions on Monday 09-14-13.   Prescriptions in prescription book with the Injection nurse.

## 2013-09-15 ENCOUNTER — Emergency Department (HOSPITAL_COMMUNITY): Payer: Medicare Other

## 2013-09-15 ENCOUNTER — Inpatient Hospital Stay (HOSPITAL_COMMUNITY)
Admission: EM | Admit: 2013-09-15 | Discharge: 2013-09-21 | DRG: 390 | Disposition: A | Discharge status: Home / Self Care | Payer: Medicare Other | Attending: Oncology | Admitting: Oncology

## 2013-09-15 ENCOUNTER — Encounter (HOSPITAL_COMMUNITY): Payer: Self-pay | Admitting: Emergency Medicine

## 2013-09-15 DIAGNOSIS — I129 Hypertensive chronic kidney disease with stage 1 through stage 4 chronic kidney disease, or unspecified chronic kidney disease: Secondary | ICD-10-CM | POA: Diagnosis present

## 2013-09-15 DIAGNOSIS — G8929 Other chronic pain: Secondary | ICD-10-CM | POA: Diagnosis present

## 2013-09-15 DIAGNOSIS — M329 Systemic lupus erythematosus, unspecified: Secondary | ICD-10-CM | POA: Diagnosis present

## 2013-09-15 DIAGNOSIS — Z6832 Body mass index (BMI) 32.0-32.9, adult: Secondary | ICD-10-CM | POA: Diagnosis not present

## 2013-09-15 DIAGNOSIS — K449 Diaphragmatic hernia without obstruction or gangrene: Secondary | ICD-10-CM | POA: Diagnosis present

## 2013-09-15 DIAGNOSIS — Z7902 Long term (current) use of antithrombotics/antiplatelets: Secondary | ICD-10-CM | POA: Diagnosis not present

## 2013-09-15 DIAGNOSIS — D649 Anemia, unspecified: Secondary | ICD-10-CM | POA: Diagnosis present

## 2013-09-15 DIAGNOSIS — Z841 Family history of disorders of kidney and ureter: Secondary | ICD-10-CM | POA: Diagnosis not present

## 2013-09-15 DIAGNOSIS — N058 Unspecified nephritic syndrome with other morphologic changes: Secondary | ICD-10-CM | POA: Diagnosis present

## 2013-09-15 DIAGNOSIS — Z803 Family history of malignant neoplasm of breast: Secondary | ICD-10-CM | POA: Diagnosis not present

## 2013-09-15 DIAGNOSIS — N183 Chronic kidney disease, stage 3 unspecified: Secondary | ICD-10-CM | POA: Diagnosis present

## 2013-09-15 DIAGNOSIS — Z801 Family history of malignant neoplasm of trachea, bronchus and lung: Secondary | ICD-10-CM | POA: Diagnosis not present

## 2013-09-15 DIAGNOSIS — C50311 Malignant neoplasm of lower-inner quadrant of right female breast: Secondary | ICD-10-CM | POA: Diagnosis present

## 2013-09-15 DIAGNOSIS — Z9010 Acquired absence of unspecified breast and nipple: Secondary | ICD-10-CM | POA: Diagnosis not present

## 2013-09-15 DIAGNOSIS — Z9089 Acquired absence of other organs: Secondary | ICD-10-CM | POA: Diagnosis not present

## 2013-09-15 DIAGNOSIS — K565 Intestinal adhesions [bands], unspecified as to partial versus complete obstruction: Principal | ICD-10-CM | POA: Diagnosis present

## 2013-09-15 DIAGNOSIS — E876 Hypokalemia: Secondary | ICD-10-CM | POA: Diagnosis not present

## 2013-09-15 DIAGNOSIS — E1129 Type 2 diabetes mellitus with other diabetic kidney complication: Secondary | ICD-10-CM

## 2013-09-15 DIAGNOSIS — E669 Obesity, unspecified: Secondary | ICD-10-CM | POA: Diagnosis present

## 2013-09-15 DIAGNOSIS — Z8673 Personal history of transient ischemic attack (TIA), and cerebral infarction without residual deficits: Secondary | ICD-10-CM

## 2013-09-15 DIAGNOSIS — E1169 Type 2 diabetes mellitus with other specified complication: Secondary | ICD-10-CM | POA: Diagnosis present

## 2013-09-15 DIAGNOSIS — Z79899 Other long term (current) drug therapy: Secondary | ICD-10-CM

## 2013-09-15 DIAGNOSIS — I209 Angina pectoris, unspecified: Secondary | ICD-10-CM | POA: Diagnosis present

## 2013-09-15 DIAGNOSIS — Z923 Personal history of irradiation: Secondary | ICD-10-CM

## 2013-09-15 DIAGNOSIS — Z794 Long term (current) use of insulin: Secondary | ICD-10-CM | POA: Diagnosis not present

## 2013-09-15 DIAGNOSIS — K56609 Unspecified intestinal obstruction, unspecified as to partial versus complete obstruction: Secondary | ICD-10-CM

## 2013-09-15 DIAGNOSIS — Z8249 Family history of ischemic heart disease and other diseases of the circulatory system: Secondary | ICD-10-CM | POA: Diagnosis not present

## 2013-09-15 DIAGNOSIS — Z170 Estrogen receptor positive status [ER+]: Secondary | ICD-10-CM | POA: Diagnosis not present

## 2013-09-15 DIAGNOSIS — R0989 Other specified symptoms and signs involving the circulatory and respiratory systems: Secondary | ICD-10-CM | POA: Diagnosis present

## 2013-09-15 DIAGNOSIS — Z7982 Long term (current) use of aspirin: Secondary | ICD-10-CM

## 2013-09-15 DIAGNOSIS — Z9861 Coronary angioplasty status: Secondary | ICD-10-CM | POA: Diagnosis not present

## 2013-09-15 DIAGNOSIS — K219 Gastro-esophageal reflux disease without esophagitis: Secondary | ICD-10-CM | POA: Diagnosis present

## 2013-09-15 DIAGNOSIS — I252 Old myocardial infarction: Secondary | ICD-10-CM | POA: Diagnosis not present

## 2013-09-15 DIAGNOSIS — I2510 Atherosclerotic heart disease of native coronary artery without angina pectoris: Secondary | ICD-10-CM | POA: Diagnosis present

## 2013-09-15 DIAGNOSIS — Z800 Family history of malignant neoplasm of digestive organs: Secondary | ICD-10-CM | POA: Diagnosis not present

## 2013-09-15 DIAGNOSIS — C50319 Malignant neoplasm of lower-inner quadrant of unspecified female breast: Secondary | ICD-10-CM | POA: Diagnosis present

## 2013-09-15 DIAGNOSIS — E1165 Type 2 diabetes mellitus with hyperglycemia: Secondary | ICD-10-CM | POA: Diagnosis present

## 2013-09-15 DIAGNOSIS — R109 Unspecified abdominal pain: Secondary | ICD-10-CM | POA: Diagnosis present

## 2013-09-15 DIAGNOSIS — C50919 Malignant neoplasm of unspecified site of unspecified female breast: Secondary | ICD-10-CM

## 2013-09-15 DIAGNOSIS — E785 Hyperlipidemia, unspecified: Secondary | ICD-10-CM | POA: Diagnosis present

## 2013-09-15 HISTORY — DX: Reserved for concepts with insufficient information to code with codable children: IMO0002

## 2013-09-15 HISTORY — DX: Systemic lupus erythematosus, unspecified: M32.9

## 2013-09-15 LAB — CBC
HCT: 31.4 % — ABNORMAL LOW (ref 36.0–46.0)
Hemoglobin: 10 g/dL — ABNORMAL LOW (ref 12.0–15.0)
MCH: 25.6 pg — ABNORMAL LOW (ref 26.0–34.0)
MCHC: 31.8 g/dL (ref 30.0–36.0)
MCV: 80.5 fL (ref 78.0–100.0)
Platelets: 237 10*3/uL (ref 150–400)
RBC: 3.9 MIL/uL (ref 3.87–5.11)
RDW: 15.1 % (ref 11.5–15.5)
WBC: 8.7 10*3/uL (ref 4.0–10.5)

## 2013-09-15 LAB — CBC WITH DIFFERENTIAL/PLATELET
Basophils Absolute: 0 10*3/uL (ref 0.0–0.1)
Basophils Relative: 0 % (ref 0–1)
EOS PCT: 1 % (ref 0–5)
Eosinophils Absolute: 0.1 10*3/uL (ref 0.0–0.7)
HCT: 31.6 % — ABNORMAL LOW (ref 36.0–46.0)
Hemoglobin: 10.1 g/dL — ABNORMAL LOW (ref 12.0–15.0)
LYMPHS ABS: 1.2 10*3/uL (ref 0.7–4.0)
LYMPHS PCT: 10 % — AB (ref 12–46)
MCH: 25.9 pg — ABNORMAL LOW (ref 26.0–34.0)
MCHC: 32 g/dL (ref 30.0–36.0)
MCV: 81 fL (ref 78.0–100.0)
Monocytes Absolute: 0.4 10*3/uL (ref 0.1–1.0)
Monocytes Relative: 3 % (ref 3–12)
NEUTROS ABS: 10 10*3/uL — AB (ref 1.7–7.7)
Neutrophils Relative %: 86 % — ABNORMAL HIGH (ref 43–77)
PLATELETS: 285 10*3/uL (ref 150–400)
RBC: 3.9 MIL/uL (ref 3.87–5.11)
RDW: 15.2 % (ref 11.5–15.5)
WBC: 11.6 10*3/uL — AB (ref 4.0–10.5)

## 2013-09-15 LAB — COMPREHENSIVE METABOLIC PANEL
ALK PHOS: 80 U/L (ref 39–117)
ALT: 21 U/L (ref 0–35)
AST: 24 U/L (ref 0–37)
Albumin: 3.9 g/dL (ref 3.5–5.2)
BILIRUBIN TOTAL: 0.3 mg/dL (ref 0.3–1.2)
BUN: 26 mg/dL — ABNORMAL HIGH (ref 6–23)
CHLORIDE: 98 meq/L (ref 96–112)
CO2: 27 meq/L (ref 19–32)
Calcium: 9.7 mg/dL (ref 8.4–10.5)
Creatinine, Ser: 1.49 mg/dL — ABNORMAL HIGH (ref 0.50–1.10)
GFR calc non Af Amer: 35 mL/min — ABNORMAL LOW (ref >90)
GFR, EST AFRICAN AMERICAN: 41 mL/min — AB (ref >90)
GLUCOSE: 258 mg/dL — AB (ref 70–99)
Potassium: 4.8 mEq/L (ref 3.7–5.3)
Sodium: 138 mEq/L (ref 137–147)
Total Protein: 8.7 g/dL — ABNORMAL HIGH (ref 6.0–8.3)

## 2013-09-15 LAB — GLUCOSE, CAPILLARY
GLUCOSE-CAPILLARY: 178 mg/dL — AB (ref 70–99)
Glucose-Capillary: 202 mg/dL — ABNORMAL HIGH (ref 70–99)
Glucose-Capillary: 176 mg/dL — ABNORMAL HIGH (ref 70–99)
Glucose-Capillary: 148 mg/dL — ABNORMAL HIGH (ref 70–99)

## 2013-09-15 LAB — CREATININE, SERUM
CREATININE: 1.3 mg/dL — AB (ref 0.50–1.10)
GFR calc Af Amer: 48 mL/min — ABNORMAL LOW (ref >90)
GFR, EST NON AFRICAN AMERICAN: 42 mL/min — AB (ref >90)

## 2013-09-15 LAB — I-STAT CG4 LACTIC ACID, ED: Lactic Acid, Venous: 1.75 mmol/L (ref 0.5–2.2)

## 2013-09-15 LAB — URINALYSIS, ROUTINE W REFLEX MICROSCOPIC
BILIRUBIN URINE: NEGATIVE
GLUCOSE, UA: 250 mg/dL — AB
HGB URINE DIPSTICK: NEGATIVE
Ketones, ur: NEGATIVE mg/dL
Leukocytes, UA: NEGATIVE
Nitrite: NEGATIVE
PROTEIN: NEGATIVE mg/dL
Specific Gravity, Urine: 1.031 — ABNORMAL HIGH (ref 1.005–1.030)
UROBILINOGEN UA: 0.2 mg/dL (ref 0.0–1.0)
pH: 7 (ref 5.0–8.0)

## 2013-09-15 LAB — LIPASE, BLOOD: Lipase: 16 U/L (ref 11–59)

## 2013-09-15 MED ORDER — MORPHINE SULFATE 4 MG/ML IJ SOLN
4.0000 mg | Freq: Once | INTRAMUSCULAR | Status: AC
  Administered 2013-09-15: 4 mg via INTRAVENOUS

## 2013-09-15 MED ORDER — ONDANSETRON HCL 4 MG/2ML IJ SOLN
4.0000 mg | Freq: Once | INTRAMUSCULAR | Status: AC
  Administered 2013-09-15: 4 mg via INTRAVENOUS
  Filled 2013-09-15: qty 2

## 2013-09-15 MED ORDER — HEPARIN SODIUM (PORCINE) 5000 UNIT/ML IJ SOLN
5000.0000 [IU] | Freq: Three times a day (TID) | INTRAMUSCULAR | Status: DC
  Administered 2013-09-15 – 2013-09-21 (×18): 5000 [IU] via SUBCUTANEOUS
  Filled 2013-09-15 (×21): qty 1

## 2013-09-15 MED ORDER — INSULIN ASPART 100 UNIT/ML ~~LOC~~ SOLN
0.0000 [IU] | SUBCUTANEOUS | Status: DC
  Administered 2013-09-15 (×3): 3 [IU] via SUBCUTANEOUS
  Administered 2013-09-16: 2 [IU] via SUBCUTANEOUS
  Administered 2013-09-16: 5 [IU] via SUBCUTANEOUS
  Administered 2013-09-16: 2 [IU] via SUBCUTANEOUS
  Administered 2013-09-16: 5 [IU] via SUBCUTANEOUS
  Administered 2013-09-16: 3 [IU] via SUBCUTANEOUS
  Administered 2013-09-16: 5 [IU] via SUBCUTANEOUS
  Administered 2013-09-17 (×4): 3 [IU] via SUBCUTANEOUS
  Administered 2013-09-17 (×2): 5 [IU] via SUBCUTANEOUS
  Administered 2013-09-18: 3 [IU] via SUBCUTANEOUS
  Administered 2013-09-18 (×4): 5 [IU] via SUBCUTANEOUS
  Administered 2013-09-18 – 2013-09-19 (×2): 3 [IU] via SUBCUTANEOUS
  Administered 2013-09-19: 5 [IU] via SUBCUTANEOUS
  Administered 2013-09-19: 3 [IU] via SUBCUTANEOUS

## 2013-09-15 MED ORDER — IOHEXOL 300 MG/ML  SOLN: 25.0000 mL | INTRAMUSCULAR | Status: AC

## 2013-09-15 MED ORDER — MORPHINE SULFATE 2 MG/ML IJ SOLN
1.0000 mg | INTRAMUSCULAR | Status: DC | PRN
  Administered 2013-09-15: 4 mg via INTRAVENOUS
  Filled 2013-09-15: qty 2
  Filled 2013-09-15: qty 1
  Filled 2013-09-15: qty 2

## 2013-09-15 MED ORDER — ONDANSETRON HCL 4 MG/2ML IJ SOLN
4.0000 mg | Freq: Once | INTRAMUSCULAR | Status: DC
  Filled 2013-09-15: qty 2

## 2013-09-15 MED ORDER — INSULIN ASPART 100 UNIT/ML ~~LOC~~ SOLN: 0.0000 [IU] | Freq: Every day | SUBCUTANEOUS | Status: DC

## 2013-09-15 MED ORDER — IOHEXOL 300 MG/ML  SOLN
80.0000 mL | Freq: Once | INTRAMUSCULAR | Status: AC | PRN
  Administered 2013-09-15: 80 mL via INTRAVENOUS

## 2013-09-15 MED ORDER — MORPHINE SULFATE 4 MG/ML IJ SOLN
INTRAMUSCULAR | Status: AC
  Filled 2013-09-15: qty 1

## 2013-09-15 MED ORDER — MORPHINE SULFATE 4 MG/ML IJ SOLN
4.0000 mg | Freq: Once | INTRAMUSCULAR | Status: AC
  Administered 2013-09-15: 4 mg via INTRAVENOUS
  Filled 2013-09-15: qty 1

## 2013-09-15 MED ORDER — INSULIN ASPART 100 UNIT/ML ~~LOC~~ SOLN: 0.0000 [IU] | Freq: Three times a day (TID) | SUBCUTANEOUS | Status: DC

## 2013-09-15 MED ORDER — SODIUM CHLORIDE 0.9 % IV SOLN
INTRAVENOUS | Status: AC
  Administered 2013-09-15: 12:00:00 via INTRAVENOUS

## 2013-09-15 MED ORDER — ONDANSETRON HCL 4 MG/2ML IJ SOLN
4.0000 mg | INTRAMUSCULAR | Status: DC | PRN
Start: 2013-09-15 — End: 2013-09-21
  Administered 2013-09-15 – 2013-09-16 (×2): 4 mg via INTRAVENOUS
  Filled 2013-09-15 (×3): qty 2

## 2013-09-15 MED ORDER — ONDANSETRON HCL 4 MG/2ML IJ SOLN
4.0000 mg | Freq: Once | INTRAMUSCULAR | Status: AC
  Administered 2013-09-15: 4 mg via INTRAVENOUS

## 2013-09-15 MED ORDER — LIDOCAINE VISCOUS 2 % MT SOLN
15.0000 mL | Freq: Once | OROMUCOSAL | Status: AC
  Administered 2013-09-15: 15 mL via OROMUCOSAL
  Filled 2013-09-15: qty 15

## 2013-09-15 NOTE — ED Notes (Signed)
Pt alert, NAD, calm, interactive, skin W&D, resps e/u, speaking in clear complete sentences, pt to xray.

## 2013-09-15 NOTE — ED Notes (Signed)
RN unsuccessful with IV start x2. Primary RN notified.

## 2013-09-15 NOTE — ED Notes (Signed)
Patient presents stating that she started with lower abd pain yesterday about noon.  Regular BM yesterday.  Denies dysuria

## 2013-09-15 NOTE — ED Notes (Signed)
No changes. Pt to CT.

## 2013-09-15 NOTE — H&P (Signed)
Date: 09/15/2013               Patient Name:  [name] MRN: [id number]  DOB: Nov 04, 1945 Age / Sex: 68 y.o., female   PCP: Bartholomew Crews, MD         Medical Service: Internal Medicine Teaching Service         Attending Physician: Dr. Sid Falcon, MD    First Contact: Dr. Marylouise Stacks Pager: [phone]  Second Contact: Dr. Conni Elliot Pager: [phone]       After Hours (After 5p/  First Contact Pager: [phone]  weekends / holidays): Second Contact Pager: [phone]   Chief Complaint: abdominal pain  History of Present Illness: [name] is a 68 yo  Female who presented to [facility] ED with complaints of dull non-radiating lower abdominal pain that begin ~1pm the day prior to admission. Denies nausea, vomiting, diarrhea, constipation or fever.  Her history is significant for small bowel obstruction, lupus,  GERD, hiatal hernia, chronic pain, CVA 04/2011, CKD Stage 3, DM Type 2, hypertension, hyperlipidemia, and breast cancer with recent mastectomy Feb 2015 and XRT May 2015.  Imagining in ED revealed Abd XR with scattered air-fluid levels and dilated small bowel; CT indicative of high-grade small bowel obstruction. IMTS was consulted to admit giving multiple medical co-morbidities.  Meds: No current facility-administered medications for this encounter.    Allergies: Allergies as of 09/15/2013 - Review Complete 09/15/2013  Allergen Reaction Noted  . Percocet [oxycodone-acetaminophen] Other (See Comments) 05/05/2013  . Diazepam  04/17/2006  . Haloperidol lactate    . Lorazepam Nausea Only   . Morphine sulfate  04/17/2006  . Propoxyphene hcl  05/16/2007  . Tramadol hcl     Past Medical History  Diagnosis Date  . Hypertension     2006 B renal arteries patent. 2003 MRA - no RAS. 2003 pheo W/U Dr Hassell Done reportedly negative.  Marland Kitchen CAD (coronary artery disease) 1996    1996 - PTCA and angioplasty diagonal branch. 2000 - Rotoblator & angiopllasty of diagonal. 2006 - subendocardial AMI, DES to  proximal LAD.Marland Kitchen Also had 90% stenosis in distal apical LAD. EF 55 with apical hypokinesis. Indefinite ASA and Plavix.  . Abdominal discomfort     Chronic N/V/D. Presumptive dx Crohn's dx per elevated p ANCA. Failed Entocort and Pentasa. Sep 2003 - ileocolectomy c anastomosis per Dr Deon Pilling 2/2 adhesions - path was hegative for Crohns. EGD, Sm bowel follow through (11/03), and an eteroclysis (10/03) were unrevealing. Cuases hypomag and hypocalcemia.  . Anemia     Multifactorial. Baseline HgB 10-11 ish. B12 def - 150 in 3/10. Fe Def - ferritin 35 3/10. Both are being repleted.  . Stroke     Incidental finding MRI 2002 L lacunar infarct  . Diabetes mellitus     Insulin dependent  . Allergy     Seasonal  . Chronic pain     CT 10/10 = Spinal stenosis L2 - S1.  Marland Kitchen Chronic kidney disease     Chronic renal insuff baseline Cr 1.2 - 1.4 ish.  . Adnexal mass 8/03    s/p lap BSO (R ovarian fibroma) & lysis of adhesions  . Hyperlipidemia     Managed with both a statin and Welchol. Welchol stopped 2014 2/2 cost and started on fenofibrate   . Wears dentures     top  . RBBB   . Breast cancer 03/16/13    right, 5 o'clock  . Hx of radiation therapy 06/02/13- 07/16/13  right rbeast 4500 cGy 25 sessions, right breast boost 1600 cGy in 8 sessions  . Lupus    Past Surgical History  Procedure Laterality Date  . Ptca  4/06  . Bowel resection  2003    ileocolectomy with anastomosis 2/2 adhesions  . Cholecystectomy    . Hernia repair      Ventral hernia repair  . Bilateral salpingoophorectomy  8/03    Lap BSO (R ovarian fibroma) and adhesion lysis  . Hemicolectomy      R sided hemicolectomy  . Cardiac catheterization      2 stents  . Abdominal hysterectomy    . Breast lumpectomy with needle localization and axillary sentinel lymph node bx Right 04/22/2013    Procedure: BREAST LUMPECTOMY WITH NEEDLE LOCALIZATION AND AXILLARY SENTINEL LYMPH NODE BX;  Surgeon: Stark Klein, MD;  Location: [place];  Service: General;  Laterality: Right;   Family History  Problem Relation Age of Onset  . Pancreatic cancer Brother 11  . Cancer Mother   . Breast cancer Mother 66  . Hypertension Daughter   . Lung cancer Maternal Aunt   . Prostate cancer Maternal Uncle   . Heart attack Maternal Grandmother   . Kidney failure Maternal Aunt    History   Social History  . Marital Status: Legally Separated    Spouse Name: N/A    Number of Children: 4  . Years of Education: N/A   Occupational History  .     Social History Main Topics  . Smoking status: Never Smoker   . Smokeless tobacco: Never Used  . Alcohol Use: No  . Drug Use: No  . Sexual Activity: Yes   Other Topics Concern  . Not on file   Social History Narrative  . No narrative on file    Review of Systems: Constitutional:  Denies fever, chills, diaphoresis, appetite change and fatigue.  HEENT: Denies congestion, sore throat, rhinorrhea, sneezing  Respiratory: Denies SOB, DOE, cough, chest tightness, and wheezing.  Cardiovascular: Denies chest pain, palpitations and leg swelling.  Gastrointestinal: Endorse abdominal pain, Denies nausea, vomiting, diarrhea, constipation, blood in stool and abdominal distention.  Genitourinary: Denies dysuria, urgency, frequency, hematuria, flank pain and difficulty urinating.  Musculoskeletal: Denies myalgias, back pain, joint swelling, arthralgias and gait problem.   Skin: Denies pallor, rash and wound.  Neurological: Denies dizziness, seizures, syncope, weakness, light-headedness, numbness and headaches.   Hematological: Denies Easy bruising  Psychiatric/ Behavioral: Denies confusion     Physical Exam: Blood pressure 155/81, pulse 77, temperature 98.6 F (37 C), temperature source Oral, resp. rate 18, height _0  (1.626 m), weight 191 lb (86.637 kg), SpO2 100.00%.  General: Well-developed, well-nourished, in no acute distress; Head: Normocephalic, atraumatic. Eyes:  PERRLA, EOMI, No signs of anemia or jaundice. Nose: Moist mucous membranes, no purulence, normal turbinates Throat: Oropharynx nonerythematous, no exudate appreciated.  Neck: supple, no masses, no carotid Bruits, no JVD appreciated. Lungs: Normal respiratory effort. Clear to auscultation bilaterally from apices to bases without crackles or wheezes appreciated. Heart: normal rate, regular rhythm, normal S1 and S2, no gallop, murmur, or rubs appreciated. Abdomen: BS normoactive. Soft, obese, several prior surgical scars most notable lower midline vertical, TTP bilateral lower quadrants Left>right Extremities: No pretibial edema, distal pulses intact Neurologic: grossly non-focal, alert and oriented x3, appropriate and cooperative throughout examination.     Lab results: Basic Metabolic Panel:  Recent Labs  09/15/13 0308  NA 138  K 4.8  CL 98  CO2  27  GLUCOSE 258*  BUN 26*  CREATININE 1.49*  CALCIUM 9.7   Liver Function Tests:  Recent Labs  09/15/13 0308  AST 24  ALT 21  ALKPHOS 80  BILITOT 0.3  PROT 8.7*  ALBUMIN 3.9    Recent Labs  09/15/13 0308  LIPASE 16   CBC:  Recent Labs  09/15/13 0308  WBC 11.6*  NEUTROABS 10.0*  HGB 10.1*  HCT 31.6*  MCV 81.0  PLT 285     Imaging results:  Ct Abdomen Pelvis W Contrast  09/15/2013   CLINICAL DATA:  Abdominal pain  EXAM: CT ABDOMEN AND PELVIS WITH CONTRAST  TECHNIQUE: Multidetector CT imaging of the abdomen and pelvis was performed using the standard protocol following bolus administration of intravenous contrast.  CONTRAST:  24m OMNIPAQUE IOHEXOL 300 MG/ML  SOLN  COMPARISON:  12/30/2008  FINDINGS: BODY WALL: There is skin thickening of the right breast. Low-density present within the right lumpectomy cavity. Based on colloid injection 2/15, the lumpectomy was recent and these are likely postoperative changes.  LOWER CHEST: Unremarkable.  ABDOMEN/PELVIS:  Liver: No focal abnormality.  Biliary: Cholecystectomy. This  accounts for mild enlargement of the common bile duct, likely reservoir effect.  Pancreas: Main pancreatic duct is diffusely prominent, measuring 4 mm at the pancreatic head. Previous non contrast imaging did not resolved the pancreatic duct well, but suspect this is a chronic finding based on those comparison studies. There is no visible mass or stone at the ampulla. The pancreas is atrophic, but expected for age. No parenchymal calcifications.  Spleen: Unremarkable.  Adrenals: Unremarkable.  Kidneys and ureters: 22 mm interpolar right renal cyst. No hydronephrosis or nephrolithiasis.  Bladder: Unremarkable.  Reproductive: Unremarkable.  Bowel: There is abnormal small bowel dilatation and fluid filling leading up to an abrupt transition point in the left lower quadrant where there is distal decompression of bowel loops. At the transition point, the bowel is angulated suggesting adhesion. There is minimal mesenteric congestion. No evidence of bowel devascularization or perforation. Mottled lucency in the affected bowel loops is more consistent with fecalized contents the pneumatosis. Probable resection of the ileocecal valve. Appendix surgically absent.  Retroperitoneum: No mass or adenopathy.  Peritoneum: No ascites or pneumoperitoneum.  Vascular: No acute abnormality.  OSSEOUS: No acute abnormalities.  IMPRESSION: High-grade distal small bowel obstruction, likely from adhesions.No evidence of bowel necrosis.   Electronically Signed   By: JJorje GuildM.D.   On: 09/15/2013 07:16   Dg Abd Acute W/chest  09/15/2013   CLINICAL DATA:  Lower abdominal pain.  EXAM: ACUTE ABDOMEN SERIES (ABDOMEN 2 VIEW & CHEST 1 VIEW)  COMPARISON:  Chest x-ray 01/03/2010  FINDINGS: There are scattered air-fluid levels within mildly dilated small bowel. Colonic gas and stool is present. No pneumoperitoneum. No suspicious intra-abdominal mass effect or calcification. There are cholecystectomy clips. There has been a right axillary  dissection. The lungs are clear and well aerated. No effusion or pneumothorax. No cardiomegaly. Coronary stent noted.  IMPRESSION: Abnormal bowel gas pattern which could reflect ileus/enteritis or early small bowel obstruction.   Electronically Signed   By: JJorje GuildM.D.   On: 09/15/2013 04:28    Assessment & Plan by Problem:  1. Small bowel obstruction: likely secondary to abdominal adhesions from multiple abdominal surgeries including cholecystectomy, ileocolectomy with anastomosis, and right sided hemicolectomy. Has had prior small bowel obstruction "years ago", -Surgery consulted -NGT to wall suction for now with bowel rest -NPO  2. CAD with stable angina: followed by  Dr. Johnsie Cancel of [facility] cardiology, currently no chest pain -hold Plavix in setting of possible surgery  Per Surgery recs  3. DM Type 2: hold Lantus and meal coverage while NPO, last HgbA1c >10 -cover with SSI  4. Stage 1 Invasive ductal carcinoma (right breast): s/p lumpectomy, radiation therapy, managed with anastrozole currently  5. CKD Stage 3: at baseline creatinine  Recent Labs Lab 09/15/13 0308  CREATININE 1.49*   6. Hypertension: currently 151/57 pulse 75 bpm -currently NPO -consider antihypertensives per NGT  7. Anemia: stable at baseline Hgb  Recent Labs Lab 09/15/13 0308  HGB 10.1*    DVT ppx: Hep SQ CODE FULL  Dispo: Disposition is deferred at this time, awaiting improvement of current medical problems. Anticipated discharge in approximately 3-5 day(s).   The patient does have a current PCP Bartholomew Crews, MD) and does need an [facility] hospital follow-up appointment after discharge.  The patient does not have transportation limitations that hinder transportation to clinic appointments.  Signed: Jeralene Huff, MD 09/15/2013, 10:09 AM  Attending physician admitting note: History, physical findings, problem assessment and plan accurate as recorded above by resident physician  Dr. Dorian Heckle.  68 year old [place] American woman with multiple medical problems including coronary artery disease status post previous angioplasty with stents, hypertension, type 2 diabetes now insulin-dependent, chronic renal insufficiency, lupus?, hyperlipidemia. She has had multiple abdominal surgeries in the past including , open cholecystectomy,  hysterectomy August, 2003 for an adnexal mass which turned out to be benign. ileocolectomy in October 2003  for small bowel obstruction due to adhesions requiring laparotomy with lysis of adhesions, small bowel and cecal resection with anastomosis.Marland Kitchen  She was diagnosed with an ER positive, HER-2 negative, cancer of the right breast in December 2014 and underwent recent right lumpectomy with findings of a 0.9 cm invasive ductal cell carcinoma, 6 nodes negative. She received postoperative radiation.  She now presents with 24-hour history of vague lower abdominal pain. No nausea or vomiting. Last bowel movement about 24 hours prior to admission. Regular abdominal x-rays done in the emergency department showed an abnormal small bowel gas pattern with scattered air-fluid levels within mildly dilated small bowel loops. Colonic gas and stool present. No free air. Surgical consultation was obtained. Nasogastric tube was placed. CT scan of the abdomen and pelvis which I personally reviewed, is consistent with a high-grade distal small bowel obstruction in the left lower quadrant likely secondary to adhesions. No gross mass lesions. No signs of ischemic bowel.  She has had some nausea and vomiting in the hospital likely due to irritation from the nasogastric tube. She is passing gas but not stool per rectum. She still has a large nasogastric output, 2.4 L in the last 24 hours.  Current exam: Blood pressure 126/81, pulse 95, temperature 97.9 F (36.6 C), temperature source Oral, resp. rate 17, height _0  (1.626 m), weight 191 lb (86.637 kg), SpO2  97.00%. Middle-aged woman who appears uncomfortable Lungs are clear and resonant to percussion. Regular cardiac rhythm no murmur. The abdomen is soft, nontender, not distended, hypoactive bowel sounds. I was not able to appreciate high-pitched bowel sounds.  Impression: Distal small bowel obstruction likely secondary to adhesions from previous abdominal/pelvic surgery Plan: Continue conservative treatment. Surgery if necessary based on clinical course.  Murriel Hopper, MD, [place]  Hematology-Oncology/Internal Medicine

## 2013-09-15 NOTE — ED Provider Notes (Signed)
CSN: [id number]     Arrival date & time 09/15/13  0224 History   First MD Initiated Contact with Patient 09/15/13 [phone]     Chief Complaint  Patient presents with  . Abdominal Pain     (Consider location/radiation/quality/duration/timing/severity/associated sxs/prior Treatment) HPI  This is a 68 year old female with a history of hypertension, coronary artery disease, stroke, diabetes, multiple abdominal surgeries including cholecystectomy, hernia repair, and colon resection who presents with abdominal pain. Patient reports lower abdominal pain since yesterday.  She states that it is sharp and nonradiating. Currently it is 7/10. She states that she took Aleve prior to arrival which "helped a little." She reports nausea without vomiting. She reports normal bowel movement yesterday but developed diarrhea over the last 24 hours. She's had decreased by mouth intake. No blood noted in her stools. Patient has been afebrile.  Past Medical History  Diagnosis Date  . Hypertension     2006 B renal arteries patent. 2003 MRA - no RAS. 2003 pheo W/U Dr Hassell Done reportedly negative.  Marland Kitchen CAD (coronary artery disease) 1996    1996 - PTCA and angioplasty diagonal branch. 2000 - Rotoblator & angiopllasty of diagonal. 2006 - subendocardial AMI, DES to proximal LAD.Marland Kitchen Also had 90% stenosis in distal apical LAD. EF 55 with apical hypokinesis. Indefinite ASA and Plavix.  . Abdominal discomfort     Chronic N/V/D. Presumptive dx Crohn's dx per elevated p ANCA. Failed Entocort and Pentasa. Sep 2003 - ileocolectomy c anastomosis per Dr Deon Pilling 2/2 adhesions - path was hegative for Crohns. EGD, Sm bowel follow through (11/03), and an eteroclysis (10/03) were unrevealing. Cuases hypomag and hypocalcemia.  . Anemia     Multifactorial. Baseline HgB 10-11 ish. B12 def - 150 in 3/10. Fe Def - ferritin 35 3/10. Both are being repleted.  . Stroke     Incidental finding MRI 2002 L lacunar infarct  . Diabetes mellitus     Insulin  dependent  . Allergy     Seasonal  . Chronic pain     CT 10/10 = Spinal stenosis L2 - S1.  Marland Kitchen Chronic kidney disease     Chronic renal insuff baseline Cr 1.2 - 1.4 ish.  . Adnexal mass 8/03    s/p lap BSO (R ovarian fibroma) & lysis of adhesions  . Hyperlipidemia     Managed with both a statin and Welchol. Welchol stopped 2014 2/2 cost and started on fenofibrate   . Wears dentures     top  . RBBB   . Breast cancer 03/16/13    right, 5 o'clock  . Hx of radiation therapy 06/02/13- 07/16/13    right rbeast 4500 cGy 25 sessions, right breast boost 1600 cGy in 8 sessions   Past Surgical History  Procedure Laterality Date  . Ptca  4/06  . Bowel resection  2003    ileocolectomy with anastomosis 2/2 adhesions  . Cholecystectomy    . Hernia repair      Ventral hernia repair  . Bilateral salpingoophorectomy  8/03    Lap BSO (R ovarian fibroma) and adhesion lysis  . Hemicolectomy      R sided hemicolectomy  . Cardiac catheterization      2 stents  . Abdominal hysterectomy    . Breast lumpectomy with needle localization and axillary sentinel lymph node bx Right 04/22/2013    Procedure: BREAST LUMPECTOMY WITH NEEDLE LOCALIZATION AND AXILLARY SENTINEL LYMPH NODE BX;  Surgeon: Stark , MD;  Location: [facility];  Service:  General;  Laterality: Right;   Family History  Problem Relation Age of Onset  . Pancreatic cancer Brother 1  . Cancer Mother   . Breast cancer Mother 73  . Hypertension Daughter   . Lung cancer Maternal Aunt   . Prostate cancer Maternal Uncle   . Heart attack Maternal Grandmother   . Kidney failure Maternal Aunt    History  Substance Use Topics  . Smoking status: Never Smoker   . Smokeless tobacco: Never Used  . Alcohol Use: No   OB History   Grav Para Term Preterm Abortions TAB SAB Ect Mult Living                 Review of Systems  Constitutional: Negative for fever.  Respiratory: Negative for cough, chest tightness and shortness of  breath.   Cardiovascular: Negative for chest pain.  Gastrointestinal: Positive for nausea, abdominal pain and diarrhea. Negative for vomiting and blood in stool.  Genitourinary: Negative for dysuria.  Skin: Negative for rash.  Neurological: Negative for headaches.  Psychiatric/Behavioral: Negative for confusion.  All other systems reviewed and are negative.     Allergies  Percocet; Diazepam; Haloperidol lactate; Lorazepam; Morphine sulfate; Propoxyphene hcl; and Tramadol hcl  Home Medications   Prior to Admission medications   Medication Sig Start Date End Date Taking? Authorizing Provider  1st Choice Lancets Thin MISC by Does not apply route 2 (two) times daily.      Historical Provider, MD  amLODipine (NORVASC) 10 MG tablet Take 1 tablet (10 mg total) by mouth daily. 08/20/13   Carter Kitten, MD  anastrozole (ARIMIDEX) 1 MG tablet Take 1 tablet (1 mg total) by mouth daily. 09/11/13   Lennis Marion Downer, MD  aspirin 81 MG tablet Take 1 tablet (81 mg total) by mouth daily. 01/06/13   Bartholomew Crews, MD  atenolol (TENORMIN) 50 MG tablet TAKE ONE TABLET BY MOUTH TWICE DAILY 08/20/13   Carter Kitten, MD  atorvastatin (LIPITOR) 20 MG tablet Take 1 tablet (20 mg total) by mouth at bedtime. 08/20/13   Carter Kitten, MD  clopidogrel (PLAVIX) 75 MG tablet Take 1 tablet (75 mg total) by mouth once. 08/20/13   Carter Kitten, MD  colesevelam (WELCHOL) 625 MG tablet Take 3 tablets (1,875 mg total) by mouth daily with breakfast. 08/20/13   Carter Kitten, MD  cyanocobalamin (,VITAMIN B-12,) 1000 MCG/ML injection Inject 1 mL (1,000 mcg total) into the muscle every 30 (thirty) days. 01/06/13   Bartholomew Crews, MD  desloratadine (CLARINEX) 5 MG tablet as needed.  04/10/13   Historical Provider, MD  fenofibrate (TRICOR) 48 MG tablet Take 1 tablet (48 mg total) by mouth daily. 08/20/13 08/20/14  Carter Kitten, MD  ferrous fumarate (HEMOCYTE) 325 (106 FE) MG TABS tablet Take 1 tablet (106 mg of iron total) by  mouth daily. 09/11/13   Lennis Marion Downer, MD  furosemide (LASIX) 20 MG tablet TAKE ONE TABLET BY MOUTH TWICE DAILY 08/20/13   Carter Kitten, MD  glucose blood (ONE TOUCH ULTRA TEST) test strip 1 each by Other route 2 (two) times daily. icd 9 250.00 09/02/12   Bartholomew Crews, MD  hydrOXYzine (VISTARIL) 50 MG capsule Take 1 capsule (50 mg total) by mouth at bedtime as needed. 08/20/13   Carter Kitten, MD  insulin glargine (LANTUS) 100 UNIT/ML injection Inject 0.36 mLs (36 Units total) into the skin at bedtime. 08/20/13   Carter Kitten, MD  insulin lispro (HUMALOG) 100 UNIT/ML injection Inject subcutaneously three times  daily with breakfast, lunch and dinner. Take 3 units for meal coverage plus correction for your pre-meal blood sugar as instructed. 09/10/13 09/10/14  Carter Kitten, MD  Insulin Syringe-Needle U-100 (INSULIN SYRINGE 1CC/28G) 28G X 1/2" 1 ML MISC by Does not apply route.      Historical Provider, MD  lisinopril (PRINIVIL,ZESTRIL) 10 MG tablet Take 1 tablet (10 mg total) by mouth daily. 08/20/13   Carter Kitten, MD  loperamide (IMODIUM) 2 MG capsule Take 1 capsule (2 mg total) by mouth 1 day or 1 dose. As needed for diarrhea 08/20/13   Carter Kitten, MD  mometasone (NASONEX) 50 MCG/ACT nasal spray Place 2 sprays into the nose as needed. 08/20/13 08/20/14  Carter Kitten, MD  nitroGLYCERIN (NITROSTAT) 0.4 MG SL tablet Place 1 tablet (0.4 mg total) under the tongue every 5 (five) minutes as needed. 08/20/13   Carter Kitten, MD  potassium chloride (K-DUR) 10 MEQ tablet Take 2 tablets (20 mEq total) by mouth daily. 08/20/13 08/20/14  Carter Kitten, MD  zolpidem (AMBIEN) 5 MG tablet Take 1 tablet (5 mg total) by mouth at bedtime as needed for sleep. 08/20/13   Carter Kitten, MD  zoster vaccine live, PF, (ZOSTAVAX) 19622 UNT/0.65ML injection Inject 19,400 Units into the skin once. 07/01/12   Bartholomew Crews, MD   BP 152/85  Pulse 77  Temp(Src) 98.6 F (37 C) (Oral)  Resp 16  Ht 5' 4"  (1.626 m)  Wt  191 lb (86.637 kg)  BMI 32.77 kg/m2  SpO2 98% Physical Exam  Nursing note and vitals reviewed. Constitutional: She is oriented to person, place, and time. No distress.  Elderly, no acute distress  HENT:  Head: Normocephalic and atraumatic.  Cardiovascular: Normal rate, regular rhythm and normal heart sounds.   No murmur heard. Pulmonary/Chest: Effort normal and breath sounds normal. No respiratory distress. She has no wheezes.  Abdominal: Soft. There is tenderness. There is no rebound and no guarding.  Hyperactive bowel sounds, mild distention  Neurological: She is alert and oriented to person, place, and time.  Skin: Skin is warm and dry.  Psychiatric: She has a normal mood and affect.    ED Course  Procedures (including critical care time) Labs Review Labs Reviewed  CBC WITH DIFFERENTIAL - Abnormal; Notable for the following:    WBC 11.6 (*)    Hemoglobin 10.1 (*)    HCT 31.6 (*)    MCH 25.9 (*)    Neutrophils Relative % 86 (*)    Neutro Abs 10.0 (*)    Lymphocytes Relative 10 (*)    All other components within normal limits  COMPREHENSIVE METABOLIC PANEL  LIPASE, BLOOD  URINALYSIS, ROUTINE W REFLEX MICROSCOPIC    Imaging Review Ct Abdomen Pelvis W Contrast  09/15/2013   CLINICAL DATA:  Abdominal pain  EXAM: CT ABDOMEN AND PELVIS WITH CONTRAST  TECHNIQUE: Multidetector CT imaging of the abdomen and pelvis was performed using the standard protocol following bolus administration of intravenous contrast.  CONTRAST:  73m OMNIPAQUE IOHEXOL 300 MG/ML  SOLN  COMPARISON:  12/30/2008  FINDINGS: BODY WALL: There is skin thickening of the right breast. Low-density present within the right lumpectomy cavity. Based on colloid injection 2/15, the lumpectomy was recent and these are likely postoperative changes.  LOWER CHEST: Unremarkable.  ABDOMEN/PELVIS:  Liver: No focal abnormality.  Biliary: Cholecystectomy. This accounts for mild enlargement of the common bile duct, likely reservoir  effect.  Pancreas: Main pancreatic duct is diffusely prominent, measuring 4 mm at the pancreatic head.  Previous non contrast imaging did not resolved the pancreatic duct well, but suspect this is a chronic finding based on those comparison studies. There is no visible mass or stone at the ampulla. The pancreas is atrophic, but expected for age. No parenchymal calcifications.  Spleen: Unremarkable.  Adrenals: Unremarkable.  Kidneys and ureters: 22 mm interpolar right renal cyst. No hydronephrosis or nephrolithiasis.  Bladder: Unremarkable.  Reproductive: Unremarkable.  Bowel: There is abnormal small bowel dilatation and fluid filling leading up to an abrupt transition point in the left lower quadrant where there is distal decompression of bowel loops. At the transition point, the bowel is angulated suggesting adhesion. There is minimal mesenteric congestion. No evidence of bowel devascularization or perforation. Mottled lucency in the affected bowel loops is more consistent with fecalized contents the pneumatosis. Probable resection of the ileocecal valve. Appendix surgically absent.  Retroperitoneum: No mass or adenopathy.  Peritoneum: No ascites or pneumoperitoneum.  Vascular: No acute abnormality.  OSSEOUS: No acute abnormalities.  IMPRESSION: High-grade distal small bowel obstruction, likely from adhesions.No evidence of bowel necrosis.   Electronically Signed   By: Jorje Guild M.D.   On: 09/15/2013 07:16   Dg Abd Acute W/chest  09/15/2013   CLINICAL DATA:  Lower abdominal pain.  EXAM: ACUTE ABDOMEN SERIES (ABDOMEN 2 VIEW & CHEST 1 VIEW)  COMPARISON:  Chest x-ray 01/03/2010  FINDINGS: There are scattered air-fluid levels within mildly dilated small bowel. Colonic gas and stool is present. No pneumoperitoneum. No suspicious intra-abdominal mass effect or calcification. There are cholecystectomy clips. There has been a right axillary dissection. The lungs are clear and well aerated. No effusion or  pneumothorax. No cardiomegaly. Coronary stent noted.  IMPRESSION: Abnormal bowel gas pattern which could reflect ileus/enteritis or early small bowel obstruction.   Electronically Signed   By: Jorje Guild M.D.   On: 09/15/2013 04:28     EKG Interpretation None      MDM   Final diagnoses:  SBO (small bowel obstruction)    Patient presents with abdominal pain and nausea. She is nontoxic on exam. No signs of peritonitis. She has been having diarrhea. Differential broad.  Clinically, patient does not appear obstructed but has had multiple surgeries in the past. Workup notable for normal lactate.  Acute abdominal series with abnormal bowel gas pattern concerning for ileus. Patient sent for CT scan. CT scan suggestive of high-grade obstruction. Patient has not had any vomiting in the ER and still appears to be comfortable following pain medications. Discussed with surgery. They will consult and recommend medical management at this time. Patient will be admitted to the internal medicine teaching service.    Merryl Hacker, MD 09/15/13 2256

## 2013-09-15 NOTE — ED Notes (Signed)
Surgical PA at the bedside.

## 2013-09-15 NOTE — ED Notes (Addendum)
Denies pain nausea or other sx. Alert, NAD, calm, drinking contrast. Family x2 at [facility].

## 2013-09-15 NOTE — ED Notes (Signed)
Dr. Dina Rich back at the bedside.

## 2013-09-15 NOTE — Consult Note (Signed)
[name] 1945/08/15  [id number].   Requesting MD: Dr. Thayer Jew Chief Complaint/Reason for Consult: SBO HPI: This is a 68 yo black female who has a significant PMH as well as PSH, including ileocecectomy, open cholecystectomy, hysterectomy.  She has 3 stents from a prior MI   Her ileocecectomy was due to an obstruction that was felt to be secondary to crohn's disease but pathology was negative.  The patient has been doing well with her abdomen for a while.  She was diagnosed with breast cancer and underwent a mastectomy in February of this year by Dr. Stark Klein.  She just finished XRT in May.  Yesterday afternoon, she started developing lower abdominal pain.  She denies nausea, vomiting.  She had a BM yesterday.  No flatus since then. She denies fevers, chills, CP, SOB.  She states her appetite has been poor for the last couple of weeks, but no change in her bowel habits or stools.  She presented to [facility] today for evaluation.  After work up and CT scan, she was noted to have a SBO likely due to adhesive disease.  We have been asked to evaluate the patient for further recommendations.  ROS: Please see HPI, otherwise currently negative.  Family History  Problem Relation Age of Onset  . Pancreatic cancer Brother 22  . Cancer Mother   . Breast cancer Mother 46  . Hypertension Daughter   . Lung cancer Maternal Aunt   . Prostate cancer Maternal Uncle   . Heart attack Maternal Grandmother   . Kidney failure Maternal Aunt     Past Medical History  Diagnosis Date  . Hypertension     2006 B renal arteries patent. 2003 MRA - no RAS. 2003 pheo W/U Dr Hassell Done reportedly negative.  Marland Kitchen CAD (coronary artery disease) 1996    1996 - PTCA and angioplasty diagonal branch. 2000 - Rotoblator & angiopllasty of diagonal. 2006 - subendocardial AMI, DES to proximal LAD.Marland Kitchen Also had 90% stenosis in distal apical LAD. EF 55 with apical hypokinesis. Indefinite ASA and Plavix.  . Abdominal discomfort      Chronic N/V/D. Presumptive dx Crohn's dx per elevated p ANCA. Failed Entocort and Pentasa. Sep 2003 - ileocolectomy c anastomosis per Dr Deon Pilling 2/2 adhesions - path was hegative for Crohns. EGD, Sm bowel follow through (11/03), and an eteroclysis (10/03) were unrevealing. Cuases hypomag and hypocalcemia.  . Anemia     Multifactorial. Baseline HgB 10-11 ish. B12 def - 150 in 3/10. Fe Def - ferritin 35 3/10. Both are being repleted.  . Stroke     Incidental finding MRI 2002 L lacunar infarct  . Diabetes mellitus     Insulin dependent  . Allergy     Seasonal  . Chronic pain     CT 10/10 = Spinal stenosis L2 - S1.  Marland Kitchen Chronic kidney disease     Chronic renal insuff baseline Cr 1.2 - 1.4 ish.  . Adnexal mass 8/03    s/p lap BSO (R ovarian fibroma) & lysis of adhesions  . Hyperlipidemia     Managed with both a statin and Welchol. Welchol stopped 2014 2/2 cost and started on fenofibrate   . Wears dentures     top  . RBBB   . Breast cancer 03/16/13    right, 5 o'clock  . Hx of radiation therapy 06/02/13- 07/16/13    right rbeast 4500 cGy 25 sessions, right breast boost 1600 cGy in 8 sessions  Lupus  Past Surgical History  Procedure Laterality Date  . Ptca  4/06  . Bowel resection  2003    ileocolectomy with anastomosis 2/2 adhesions  . Cholecystectomy    . Hernia repair      Ventral hernia repair  . Bilateral salpingoophorectomy  8/03    Lap BSO (R ovarian fibroma) and adhesion lysis  . Hemicolectomy      R sided hemicolectomy  . Cardiac catheterization      2 stents  . Abdominal hysterectomy    . Breast lumpectomy with needle localization and axillary sentinel lymph node bx Right 04/22/2013    Procedure: BREAST LUMPECTOMY WITH NEEDLE LOCALIZATION AND AXILLARY SENTINEL LYMPH NODE BX;  Surgeon: Stark Klein, MD;  Location: [facility];  Service: General;  Laterality: Right;    Social History:  reports that she has never smoked. She has never used smokeless tobacco. She  reports that she does not drink alcohol or use illicit drugs.  Allergies:  Allergies  Allergen Reactions  . Percocet [Oxycodone-Acetaminophen] Other (See Comments)    headaches  . Diazepam     REACTION: Agitation  . Haloperidol Lactate   . Lorazepam Nausea Only  . Morphine Sulfate     REACTION: Agitation  . Propoxyphene Hcl     REACTION: Vomiting  . Tramadol Hcl     REACTION: sick on stomach     (Not in a hospital admission)  Blood pressure 151/58, pulse 76, temperature 98.6 F (37 C), temperature source Oral, resp. rate 18, height 5' 4"  (1.626 m), weight 191 lb (86.637 kg), SpO2 99.00%. Physical Exam: General: pleasant, obese black female who is laying in bed in NAD HEENT: head is normocephalic, atraumatic.  Sclera are noninjected.  PERRL.  Ears and nose without any masses or lesions.  Mouth is pink and moist Heart: regular, rate, and rhythm.  Normal s1,s2. No obvious murmurs, gallops, or rubs noted.  Palpable radial and pedal pulses bilaterally Lungs: CTAB, no wheezes, rhonchi, or rales noted.  Respiratory effort nonlabored Abd: soft, distended with some tympany, NT right now due to pain meds,  Few BS, no masses, hernias, or organomegaly.  Multiple scars from prior surgeries MS: all 4 extremities are symmetrical with no cyanosis, clubbing, or edema. Skin: warm and dry with no masses, lesions, or rashes Psych: A&Ox3 with an appropriate affect.    Results for orders placed during the hospital encounter of 09/15/13 (from the past 48 hour(s))  CBC WITH DIFFERENTIAL     Status: Abnormal   Collection Time    09/15/13  3:08 AM      Result Value Ref Range   WBC 11.6 (*) 4.0 - 10.5 K/uL   RBC 3.90  3.87 - 5.11 MIL/uL   Hemoglobin 10.1 (*) 12.0 - 15.0 g/dL   HCT 31.6 (*) 36.0 - 46.0 %   MCV 81.0  78.0 - 100.0 fL   MCH 25.9 (*) 26.0 - 34.0 pg   MCHC 32.0  30.0 - 36.0 g/dL   RDW 15.2  11.5 - 15.5 %   Platelets 285  150 - 400 K/uL   Neutrophils Relative % 86 (*) 43 - 77 %    Neutro Abs 10.0 (*) 1.7 - 7.7 K/uL   Lymphocytes Relative 10 (*) 12 - 46 %   Lymphs Abs 1.2  0.7 - 4.0 K/uL   Monocytes Relative 3  3 - 12 %   Monocytes Absolute 0.4  0.1 - 1.0 K/uL   Eosinophils Relative 1  0 - 5 %  Eosinophils Absolute 0.1  0.0 - 0.7 K/uL   Basophils Relative 0  0 - 1 %   Basophils Absolute 0.0  0.0 - 0.1 K/uL  COMPREHENSIVE METABOLIC PANEL     Status: Abnormal   Collection Time    09/15/13  3:08 AM      Result Value Ref Range   Sodium 138  137 - 147 mEq/L   Potassium 4.8  3.7 - 5.3 mEq/L   Chloride 98  96 - 112 mEq/L   CO2 27  19 - 32 mEq/L   Glucose, Bld 258 (*) 70 - 99 mg/dL   BUN 26 (*) 6 - 23 mg/dL   Creatinine, Ser 1.49 (*) 0.50 - 1.10 mg/dL   Calcium 9.7  8.4 - 10.5 mg/dL   Total Protein 8.7 (*) 6.0 - 8.3 g/dL   Albumin 3.9  3.5 - 5.2 g/dL   AST 24  0 - 37 U/L   ALT 21  0 - 35 U/L   Alkaline Phosphatase 80  39 - 117 U/L   Total Bilirubin 0.3  0.3 - 1.2 mg/dL   GFR calc non Af Amer 35 (*) >90 mL/min   GFR calc Af Amer 41 (*) >90 mL/min   Comment: (NOTE)     The eGFR has been calculated using the CKD EPI equation.     This calculation has not been validated in all clinical situations.     eGFR's persistently <90 mL/min signify possible Chronic Kidney     Disease.  LIPASE, BLOOD     Status: None   Collection Time    09/15/13  3:08 AM      Result Value Ref Range   Lipase 16  11 - 59 U/L  I-STAT CG4 LACTIC ACID, ED     Status: None   Collection Time    09/15/13  3:55 AM      Result Value Ref Range   Lactic Acid, Venous 1.75  0.5 - 2.2 mmol/L   Ct Abdomen Pelvis W Contrast  09/15/2013   CLINICAL DATA:  Abdominal pain  EXAM: CT ABDOMEN AND PELVIS WITH CONTRAST  TECHNIQUE: Multidetector CT imaging of the abdomen and pelvis was performed using the standard protocol following bolus administration of intravenous contrast.  CONTRAST:  48m OMNIPAQUE IOHEXOL 300 MG/ML  SOLN  COMPARISON:  12/30/2008  FINDINGS: BODY WALL: There is skin thickening of the right  breast. Low-density present within the right lumpectomy cavity. Based on colloid injection 2/15, the lumpectomy was recent and these are likely postoperative changes.  LOWER CHEST: Unremarkable.  ABDOMEN/PELVIS:  Liver: No focal abnormality.  Biliary: Cholecystectomy. This accounts for mild enlargement of the common bile duct, likely reservoir effect.  Pancreas: Main pancreatic duct is diffusely prominent, measuring 4 mm at the pancreatic head. Previous non contrast imaging did not resolved the pancreatic duct well, but suspect this is a chronic finding based on those comparison studies. There is no visible mass or stone at the ampulla. The pancreas is atrophic, but expected for age. No parenchymal calcifications.  Spleen: Unremarkable.  Adrenals: Unremarkable.  Kidneys and ureters: 22 mm interpolar right renal cyst. No hydronephrosis or nephrolithiasis.  Bladder: Unremarkable.  Reproductive: Unremarkable.  Bowel: There is abnormal small bowel dilatation and fluid filling leading up to an abrupt transition point in the left lower quadrant where there is distal decompression of bowel loops. At the transition point, the bowel is angulated suggesting adhesion. There is minimal mesenteric congestion. No evidence of bowel devascularization or perforation.  Mottled lucency in the affected bowel loops is more consistent with fecalized contents the pneumatosis. Probable resection of the ileocecal valve. Appendix surgically absent.  Retroperitoneum: No mass or adenopathy.  Peritoneum: No ascites or pneumoperitoneum.  Vascular: No acute abnormality.  OSSEOUS: No acute abnormalities.  IMPRESSION: High-grade distal small bowel obstruction, likely from adhesions.No evidence of bowel necrosis.   Electronically Signed   By: Jorje Guild M.D.   On: 09/15/2013 07:16   Dg Abd Acute W/chest  09/15/2013   CLINICAL DATA:  Lower abdominal pain.  EXAM: ACUTE ABDOMEN SERIES (ABDOMEN 2 VIEW & CHEST 1 VIEW)  COMPARISON:  Chest x-ray  01/03/2010  FINDINGS: There are scattered air-fluid levels within mildly dilated small bowel. Colonic gas and stool is present. No pneumoperitoneum. No suspicious intra-abdominal mass effect or calcification. There are cholecystectomy clips. There has been a right axillary dissection. The lungs are clear and well aerated. No effusion or pneumothorax. No cardiomegaly. Coronary stent noted.  IMPRESSION: Abnormal bowel gas pattern which could reflect ileus/enteritis or early small bowel obstruction.   Electronically Signed   By: Jorje Guild M.D.   On: 09/15/2013 04:28       Assessment/Plan 1. SBO Patient Active Problem List   Diagnosis Date Noted  . SBO (small bowel obstruction) 09/15/2013  . Right shoulder pain 08/20/2013  . Breast cancer of lower-inner quadrant of right female breast 03/18/2013  . Chronic venous insufficiency 01/06/2013  . Health care maintenance 05/08/2011  . Type II or unspecified type diabetes mellitus with renal manifestations, uncontrolled 05/03/2010  . Seasonal allergies 05/03/2010  . Hypertension   . CAD (coronary artery disease)   . Abdominal discomfort   . Anemia   . Stroke   . Hyperlipidemia   . Chronic pain   . Chronic kidney disease, stage III (moderate)   . Adnexal mass   Lupus Anticoagulated on Plavix  Plan: 1. We would recommend a medical admission for management of her multiple medical problems and conservative management.  Agree with NGT placement to attempt decompression to try and relieve obstruction.  If this fails, she may require surgical intervention. 2. Recommend stopping plavix for now incase she may require surgery.  Her stents were placed several years ago, so there should not be any reason this can't be held. 3. Repeat abdominal films in the morning to follow progress.  We will follow along.    OSBORNE,KELLY E 09/15/2013, 8:56 AM Pager: [phone]

## 2013-09-15 NOTE — Consult Note (Signed)
Patient examined and I agree with the assessment and plan. Nontender.  Place NGT. Check films in AM. I also spoke with her family. Georganna Skeans, MD, MPH, FACS Trauma: [phone] General Surgery: [phone]  09/15/2013 9:34 AM

## 2013-09-15 NOTE — ED Notes (Addendum)
Pt alert, NAD, calm, itneractive, sipping on PO contrast, tolerating. Family x2 at [facility].

## 2013-09-16 ENCOUNTER — Inpatient Hospital Stay (HOSPITAL_COMMUNITY): Payer: Medicare Other

## 2013-09-16 LAB — GLUCOSE, CAPILLARY
GLUCOSE-CAPILLARY: 199 mg/dL — AB (ref 70–99)
GLUCOSE-CAPILLARY: 225 mg/dL — AB (ref 70–99)
Glucose-Capillary: 127 mg/dL — ABNORMAL HIGH (ref 70–99)
Glucose-Capillary: 146 mg/dL — ABNORMAL HIGH (ref 70–99)
Glucose-Capillary: 158 mg/dL — ABNORMAL HIGH (ref 70–99)
Glucose-Capillary: 212 mg/dL — ABNORMAL HIGH (ref 70–99)
Glucose-Capillary: 227 mg/dL — ABNORMAL HIGH (ref 70–99)

## 2013-09-16 LAB — CBC
HCT: 32 % — ABNORMAL LOW (ref 36.0–46.0)
Hemoglobin: 10.3 g/dL — ABNORMAL LOW (ref 12.0–15.0)
MCH: 25.8 pg — AB (ref 26.0–34.0)
MCHC: 32.2 g/dL (ref 30.0–36.0)
MCV: 80 fL (ref 78.0–100.0)
PLATELETS: 268 10*3/uL (ref 150–400)
RBC: 4 MIL/uL (ref 3.87–5.11)
RDW: 15.2 % (ref 11.5–15.5)
WBC: 9.4 10*3/uL (ref 4.0–10.5)

## 2013-09-16 LAB — BASIC METABOLIC PANEL
ANION GAP: 13 (ref 5–15)
BUN: 19 mg/dL (ref 6–23)
BUN: 18 mg/dL (ref 6–23)
CALCIUM: 9.4 mg/dL (ref 8.4–10.5)
CHLORIDE: 102 meq/L (ref 96–112)
CO2: 29 meq/L (ref 19–32)
CO2: 26 meq/L (ref 19–32)
CREATININE: 1.29 mg/dL — AB (ref 0.50–1.10)
Calcium: 9.4 mg/dL (ref 8.4–10.5)
Chloride: 99 mEq/L (ref 96–112)
Creatinine, Ser: 1.33 mg/dL — ABNORMAL HIGH (ref 0.50–1.10)
GFR calc Af Amer: 49 mL/min — ABNORMAL LOW (ref >90)
GFR calc Af Amer: 47 mL/min — ABNORMAL LOW (ref >90)
GFR calc non Af Amer: 41 mL/min — ABNORMAL LOW (ref >90)
GFR, EST NON AFRICAN AMERICAN: 42 mL/min — AB (ref >90)
GLUCOSE: 168 mg/dL — AB (ref 70–99)
GLUCOSE: 163 mg/dL — AB (ref 70–99)
Potassium: 4 mEq/L (ref 3.7–5.3)
Potassium: 3.9 mEq/L (ref 3.7–5.3)
SODIUM: 144 meq/L (ref 137–147)
Sodium: 140 mEq/L (ref 137–147)

## 2013-09-16 MED ORDER — PHENOL 1.4 % MT LIQD
1.0000 | OROMUCOSAL | Status: DC | PRN
  Administered 2013-09-16: 1 via OROMUCOSAL
  Filled 2013-09-16: qty 177

## 2013-09-16 NOTE — Progress Notes (Addendum)
Inpatient Diabetes Program Recommendations  AACE/ADA: New Consensus Statement on Inpatient Glycemic Control (2013)  Target Ranges:  Prepandial:   less than 140 mg/dL      Peak postprandial:   less than 180 mg/dL (1-2 hours)      Critically ill patients:  140 - 180 mg/dL     Results for [name], [name] (MRN [id number]) as of 09/16/2013 09:46  Ref. Range 09/16/2013 00:33 09/16/2013 04:07 09/16/2013 08:02  Glucose-Capillary Latest Range: 70-99 mg/dL 127 (H) 212 (H) 227 (H)     Admit with SBO.  History of DM, HTN, CVA.  Home DM Meds:   Lantus 36 units QHS Humalog 3 units tidwc Humalog SSI   CBGs on the rise today.  Currently only getting Novolog Moderate SSI.    MD- Please consider adding a portion of patient's home dose of Lantus- Could start with 1/2 home dose and titrate- Lantus 18 units QHS      Will follow Wyn Quaker RN, MSN, CDE Diabetes Coordinator Inpatient Diabetes Program Team Pager: [phone] (8a-10p)

## 2013-09-16 NOTE — Progress Notes (Signed)
Subjective: [name] had no events overnight. She has had some nausea and spitting up with abdominal pain this morning. She is passing flatus with last bowel movement 09/14/13. Currently followed by surgery. Denies chest pain, SOB, fever.  Objective: Vital signs in last 24 hours: Filed Vitals:   09/15/13 1310 09/15/13 2126 09/16/13 0501 09/16/13 1348  BP: 158/61 159/61 169/69 126/81  Pulse: 74 83 86 95  Temp: 98.2 F (36.8 C) 98.3 F (36.8 C) 98.4 F (36.9 C) 97.9 F (36.6 C)  TempSrc: Oral Oral Oral Oral  Resp: 18 18 16 17   Height:      Weight:      SpO2: 94% 93% 96% 97%   Weight change:   Intake/Output Summary (Last 24 hours) at 09/16/13 1418 Last data filed at 09/16/13 1304  Gross per 24 hour  Intake      0 ml  Output   2650 ml  Net  -2650 ml  Emesis/NG output: 6/30 1400 mL, 7/1 300 mL  BP 126/81  Pulse 95  Temp(Src) 97.9 F (36.6 C) (Oral)  Resp 17  Ht 5' 4"  (1.626 m)  Wt 191 lb (86.637 kg)  BMI 32.77 kg/m2  SpO2 97%  General Appearance:    Alert, cooperative, uncomfortable/restless in bed  HEENT:    Normocephalic, without obvious abnormality, atraumatic, NG tube in place with dark brown fluid suctioned out  Lungs:     Clear to auscultation bilaterally, respirations unlabored   Heart:    Regular rate and rhythm, S1 and S2 normal, no murmur, rub   or gallop  Abdomen:     Soft, non-tender, no bowel sounds noted in any quadrant,   no masses, no organomegaly, several previous incision scars on the mid-lower abdomen  Extremities:   Extremities normal, atraumatic, no cyanosis or edema    Lab Results: BMET    Component Value Date/Time   NA 140 09/16/2013 0056   NA 137 08/06/2013 1518   K 4.0 09/16/2013 0056   K 4.1 08/06/2013 1518   CL 99 09/16/2013 0056   CO2 26 09/16/2013 0056   CO2 24 08/06/2013 1518   GLUCOSE 163* 09/16/2013 0056   GLUCOSE 283* 08/06/2013 1518   BUN 19 09/16/2013 0056   BUN 29.2* 08/06/2013 1518   CREATININE 1.29* 09/16/2013 0056   CREATININE 1.8*  08/06/2013 1518   CREATININE 1.16* 12/11/2011 1125   CALCIUM 9.4 09/16/2013 0056   CALCIUM 9.6 08/06/2013 1518   GFRNONAA 42* 09/16/2013 0056   GFRNONAA 50* 12/11/2011 1125   GFRAA 49* 09/16/2013 0056   GFRAA 57* 12/11/2011 1125    CBC    Component Value Date/Time   WBC 9.4 09/16/2013 0056   WBC 6.4 08/06/2013 1518   RBC 4.00 09/16/2013 0056   RBC 3.50* 08/06/2013 1518   HGB 10.3* 09/16/2013 0056   HGB 8.6* 08/06/2013 1518   HCT 32.0* 09/16/2013 0056   HCT 27.3* 08/06/2013 1518   PLT 268 09/16/2013 0056   PLT 296 08/06/2013 1518   MCV 80.0 09/16/2013 0056   MCV 78.0* 08/06/2013 1518   MCH 25.8* 09/16/2013 0056   MCH 24.7* 08/06/2013 1518   MCHC 32.2 09/16/2013 0056   MCHC 31.7 08/06/2013 1518   RDW 15.2 09/16/2013 0056   RDW 14.6* 08/06/2013 1518   LYMPHSABS 1.2 09/15/2013 0308   LYMPHSABS 1.3 08/06/2013 1518   MONOABS 0.4 09/15/2013 0308   MONOABS 0.6 08/06/2013 1518   EOSABS 0.1 09/15/2013 0308   EOSABS 0.1 08/06/2013 1518  BASOSABS 0.0 09/15/2013 0308   BASOSABS 0.0 08/06/2013 1518     Micro Results: No results found for this or any previous visit (from the past 240 hour(s)). Studies/Results: Ct Abdomen Pelvis W Contrast  09/15/2013   CLINICAL DATA:  Abdominal pain  EXAM: CT ABDOMEN AND PELVIS WITH CONTRAST  TECHNIQUE: Multidetector CT imaging of the abdomen and pelvis was performed using the standard protocol following bolus administration of intravenous contrast.  CONTRAST:  71m OMNIPAQUE IOHEXOL 300 MG/ML  SOLN  COMPARISON:  12/30/2008  FINDINGS: BODY WALL: There is skin thickening of the right breast. Low-density present within the right lumpectomy cavity. Based on colloid injection 2/15, the lumpectomy was recent and these are likely postoperative changes.  LOWER CHEST: Unremarkable.  ABDOMEN/PELVIS:  Liver: No focal abnormality.  Biliary: Cholecystectomy. This accounts for mild enlargement of the common bile duct, likely reservoir effect.  Pancreas: Main pancreatic duct is diffusely prominent, measuring 4  mm at the pancreatic head. Previous non contrast imaging did not resolved the pancreatic duct well, but suspect this is a chronic finding based on those comparison studies. There is no visible mass or stone at the ampulla. The pancreas is atrophic, but expected for age. No parenchymal calcifications.  Spleen: Unremarkable.  Adrenals: Unremarkable.  Kidneys and ureters: 22 mm interpolar right renal cyst. No hydronephrosis or nephrolithiasis.  Bladder: Unremarkable.  Reproductive: Unremarkable.  Bowel: There is abnormal small bowel dilatation and fluid filling leading up to an abrupt transition point in the left lower quadrant where there is distal decompression of bowel loops. At the transition point, the bowel is angulated suggesting adhesion. There is minimal mesenteric congestion. No evidence of bowel devascularization or perforation. Mottled lucency in the affected bowel loops is more consistent with fecalized contents the pneumatosis. Probable resection of the ileocecal valve. Appendix surgically absent.  Retroperitoneum: No mass or adenopathy.  Peritoneum: No ascites or pneumoperitoneum.  Vascular: No acute abnormality.  OSSEOUS: No acute abnormalities.  IMPRESSION: High-grade distal small bowel obstruction, likely from adhesions.No evidence of bowel necrosis.   Electronically Signed   By: JJorje GuildM.D.   On: 09/15/2013 07:16   Dg Abd 2 Views  09/16/2013   CLINICAL DATA:  Small bowel obstruction.  EXAM: ABDOMEN - 2 VIEW  COMPARISON:  CT scan and radiographs of September 15, 2013.  FINDINGS: Nasogastric tube tip is seen at the expected level of the gastroesophageal junction, with side hole probably above the gastroesophageal junction. Status post cholecystectomy. Mildly dilated small bowel loops with air-fluid levels are noted concerning for distal small bowel obstruction. Stool is noted in the colon which is nondilated.  IMPRESSION: Mildly dilated small bowel loops with air-fluid levels are noted concerning  for distal small bowel obstruction. Nasogastric tube tip is at the expected position of thr gastroesophageal junction.   Electronically Signed   By: JSabino DickM.D.   On: 09/16/2013 08:04   Dg Abd Acute W/chest  09/15/2013   CLINICAL DATA:  Lower abdominal pain.  EXAM: ACUTE ABDOMEN SERIES (ABDOMEN 2 VIEW & CHEST 1 VIEW)  COMPARISON:  Chest x-ray 01/03/2010  FINDINGS: There are scattered air-fluid levels within mildly dilated small bowel. Colonic gas and stool is present. No pneumoperitoneum. No suspicious intra-abdominal mass effect or calcification. There are cholecystectomy clips. There has been a right axillary dissection. The lungs are clear and well aerated. No effusion or pneumothorax. No cardiomegaly. Coronary stent noted.  IMPRESSION: Abnormal bowel gas pattern which could reflect ileus/enteritis or early small bowel obstruction.  Electronically Signed   By: Jorje Guild M.D.   On: 09/15/2013 04:28   Medications: I have reviewed the patient's current medications. Scheduled Meds: . heparin  5,000 Units Subcutaneous 3 times per day  . insulin aspart  0-15 Units Subcutaneous 6 times per day  . insulin aspart  0-5 Units Subcutaneous QHS   Continuous Infusions:  PRN Meds:.morphine injection, ondansetron (ZOFRAN) IV  Assessment/Plan:  1. Small bowel obstruction: As found on CT and serial abdominal XR. Patient is clinically improving and agree with surgery recommendation for medical management as patient is passing flatus and NG output over last 24 hours >1L. likely secondary to abdominal adhesions from multiple abdominal surgeries including cholecystectomy, ileocolectomy with anastomosis, and right sided hemicolectomy. Has had prior small bowel obstruction "years ago",  -Appreciate surgery recs -NGT to wall suction for now with bowel rest  -Continue to monitor NG output and will not clamp tube for now as there is continued output -NPO  -serial AXR  2. CAD with stable angina: followed  by [facility]. Johnsie [facility]ancel [facility] cardiology, currently no chest pain  -continue to hold Plavix in setting of possible surgery  3. DM Type 2: hold Lantus and meal coverage while NPO, last HgbA1c >10  -cover with SSI   4. Stage 1 Invasive ductal carcinoma (right breast): s/p lumpectomy, radiation therapy, managed with anastrozole currently   5. CKD Stage 3: at baseline creatinine (currently 1.3)   6. Hypertension: currently 126/81 pulse 95 bpm  -currently NPO  -consider antihypertensives per NGT   7. Anemia: stable at baseline Hgb (10.3)  DVT ppx: Hep SQ  CODE FULL   Dispo: Disposition is deferred at this time, awaiting improvement of current medical problems.  Anticipated discharge in approximately 2 day(s).   The patient does have a current PCP Bartholomew Crews, MD) and does need an [facility] hospital follow-up appointment after discharge.  The patient does not know have transportation limitations that hinder transportation to clinic appointments.  .Services Needed at time of discharge: Y = Yes, Blank = No PT:   OT:   RN:   Equipment:   Other:     LOS: 1 day   Kelby Aline, MD 09/16/2013, 2:18 PM

## 2013-09-16 NOTE — Progress Notes (Signed)
Visit to patient while in hospital. Will call after patient is discharged to assist with transition of care. 

## 2013-09-16 NOTE — Progress Notes (Signed)
Subjective: PASSING GAS NOT MUCH PAIN OR NAUSEA  Objective: Vital signs in last 24 hours: Temp:  [98.2 F (36.8 C)-98.4 F (36.9 C)] 98.4 F (36.9 C) (07/01 0501) Pulse Rate:  [74-86] 86 (07/01 0501) Resp:  [16-18] 16 (07/01 0501) BP: (151-169)/(57-81) 169/69 mmHg (07/01 0501) SpO2:  [93 %-100 %] 96 % (07/01 0501) Last BM Date: 09/14/13  Intake/Output from previous day: 06/30 0701 - 07/01 0700 In: 171.7 [I.V.:171.7] Out: 2400 [Urine:1000; Emesis/NG output:1400] Intake/Output this shift:    GI: abnormal findings:  hypoactive bowel sounds SOFT NOT TENDER  Lab Results:   Recent Labs  09/15/13 1120 09/16/13 0056  WBC 8.7 9.4  HGB 10.0* 10.3*  HCT 31.4* 32.0*  PLT 237 268   BMET  Recent Labs  09/15/13 0308 09/15/13 1120 09/16/13 0056  NA 138  --  140  K 4.8  --  4.0  CL 98  --  99  CO2 27  --  26  GLUCOSE 258*  --  163*  BUN 26*  --  19  CREATININE 1.49* 1.30* 1.29*  CALCIUM 9.7  --  9.4   PT/INR No results found for this basename: LABPROT, INR,  in the last 72 hours ABG No results found for this basename: PHART, PCO2, PO2, HCO3,  in the last 72 hours  Studies/Results: Ct Abdomen Pelvis W Contrast  09/15/2013   CLINICAL DATA:  Abdominal pain  EXAM: CT ABDOMEN AND PELVIS WITH CONTRAST  TECHNIQUE: Multidetector CT imaging of the abdomen and pelvis was performed using the standard protocol following bolus administration of intravenous contrast.  CONTRAST:  33m OMNIPAQUE IOHEXOL 300 MG/ML  SOLN  COMPARISON:  12/30/2008  FINDINGS: BODY WALL: There is skin thickening of the right breast. Low-density present within the right lumpectomy cavity. Based on colloid injection 2/15, the lumpectomy was recent and these are likely postoperative changes.  LOWER CHEST: Unremarkable.  ABDOMEN/PELVIS:  Liver: No focal abnormality.  Biliary: Cholecystectomy. This accounts for mild enlargement of the common bile duct, likely reservoir effect.  Pancreas: Main pancreatic duct is  diffusely prominent, measuring 4 mm at the pancreatic head. Previous non contrast imaging did not resolved the pancreatic duct well, but suspect this is a chronic finding based on those comparison studies. There is no visible mass or stone at the ampulla. The pancreas is atrophic, but expected for age. No parenchymal calcifications.  Spleen: Unremarkable.  Adrenals: Unremarkable.  Kidneys and ureters: 22 mm interpolar right renal cyst. No hydronephrosis or nephrolithiasis.  Bladder: Unremarkable.  Reproductive: Unremarkable.  Bowel: There is abnormal small bowel dilatation and fluid filling leading up to an abrupt transition point in the left lower quadrant where there is distal decompression of bowel loops. At the transition point, the bowel is angulated suggesting adhesion. There is minimal mesenteric congestion. No evidence of bowel devascularization or perforation. Mottled lucency in the affected bowel loops is more consistent with fecalized contents the pneumatosis. Probable resection of the ileocecal valve. Appendix surgically absent.  Retroperitoneum: No mass or adenopathy.  Peritoneum: No ascites or pneumoperitoneum.  Vascular: No acute abnormality.  OSSEOUS: No acute abnormalities.  IMPRESSION: High-grade distal small bowel obstruction, likely from adhesions.No evidence of bowel necrosis.   Electronically Signed   By: JJorje GuildM.D.   On: 09/15/2013 07:16   Dg Abd 2 Views  09/16/2013   CLINICAL DATA:  Small bowel obstruction.  EXAM: ABDOMEN - 2 VIEW  COMPARISON:  CT scan and radiographs of September 15, 2013.  FINDINGS: Nasogastric tube tip  is seen at the expected level of the gastroesophageal junction, with side hole probably above the gastroesophageal junction. Status post cholecystectomy. Mildly dilated small bowel loops with air-fluid levels are noted concerning for distal small bowel obstruction. Stool is noted in the colon which is nondilated.  IMPRESSION: Mildly dilated small bowel loops with  air-fluid levels are noted concerning for distal small bowel obstruction. Nasogastric tube tip is at the expected position of thr gastroesophageal junction.   Electronically Signed   By: Sabino Dick M.D.   On: 09/16/2013 08:04   Dg Abd Acute W/chest  09/15/2013   CLINICAL DATA:  Lower abdominal pain.  EXAM: ACUTE ABDOMEN SERIES (ABDOMEN 2 VIEW & CHEST 1 VIEW)  COMPARISON:  Chest x-ray 01/03/2010  FINDINGS: There are scattered air-fluid levels within mildly dilated small bowel. Colonic gas and stool is present. No pneumoperitoneum. No suspicious intra-abdominal mass effect or calcification. There are cholecystectomy clips. There has been a right axillary dissection. The lungs are clear and well aerated. No effusion or pneumothorax. No cardiomegaly. Coronary stent noted.  IMPRESSION: Abnormal bowel gas pattern which could reflect ileus/enteritis or early small bowel obstruction.   Electronically Signed   By: Jorje Guild M.D.   On: 09/15/2013 04:28    Anti-infectives: Anti-infectives   None      Assessment/Plan: pSBO Passing flatus and minimal pain/nausea Clamp NGT OOB Films in AM  LOS: 1 day    Katie Mckey A. 09/16/2013

## 2013-09-17 ENCOUNTER — Inpatient Hospital Stay (HOSPITAL_COMMUNITY): Payer: Medicare Other

## 2013-09-17 DIAGNOSIS — J3489 Other specified disorders of nose and nasal sinuses: Secondary | ICD-10-CM

## 2013-09-17 LAB — BASIC METABOLIC PANEL
ANION GAP: 15 (ref 5–15)
BUN: 19 mg/dL (ref 6–23)
CHLORIDE: 102 meq/L (ref 96–112)
CO2: 26 meq/L (ref 19–32)
CREATININE: 1.31 mg/dL — AB (ref 0.50–1.10)
Calcium: 9.3 mg/dL (ref 8.4–10.5)
GFR calc Af Amer: 48 mL/min — ABNORMAL LOW (ref >90)
GFR calc non Af Amer: 41 mL/min — ABNORMAL LOW (ref >90)
Glucose, Bld: 181 mg/dL — ABNORMAL HIGH (ref 70–99)
POTASSIUM: 3.8 meq/L (ref 3.7–5.3)
Sodium: 143 mEq/L (ref 137–147)

## 2013-09-17 LAB — GLUCOSE, CAPILLARY
GLUCOSE-CAPILLARY: 168 mg/dL — AB (ref 70–99)
GLUCOSE-CAPILLARY: 189 mg/dL — AB (ref 70–99)
Glucose-Capillary: 171 mg/dL — ABNORMAL HIGH (ref 70–99)
Glucose-Capillary: 200 mg/dL — ABNORMAL HIGH (ref 70–99)
Glucose-Capillary: 230 mg/dL — ABNORMAL HIGH (ref 70–99)
Glucose-Capillary: 213 mg/dL — ABNORMAL HIGH (ref 70–99)

## 2013-09-17 MED ORDER — DEXTROSE-NACL 5-0.9 % IV SOLN
INTRAVENOUS | Status: DC
  Administered 2013-09-17 – 2013-09-18 (×5): via INTRAVENOUS

## 2013-09-17 MED ORDER — KETOROLAC TROMETHAMINE 15 MG/ML IJ SOLN
15.0000 mg | Freq: Four times a day (QID) | INTRAMUSCULAR | Status: DC | PRN
  Administered 2013-09-17 – 2013-09-19 (×7): 15 mg via INTRAVENOUS
  Filled 2013-09-17 (×8): qty 1

## 2013-09-17 MED ORDER — DIPHENHYDRAMINE HCL 50 MG/ML IJ SOLN
25.0000 mg | Freq: Four times a day (QID) | INTRAMUSCULAR | Status: DC | PRN
  Administered 2013-09-17 – 2013-09-19 (×6): 25 mg via INTRAVENOUS
  Filled 2013-09-17 (×6): qty 1

## 2013-09-17 NOTE — Progress Notes (Signed)
Patient ID: [name], female   DOB: 1945/11/21, 68 y.o.   MRN: [id number]  Subjective: Passing flatus, no BM.  No abdominal pain. 1272m/24h from NGT, did not tolerate clamping trial yesterday.    Objective:  Vital signs:  Filed Vitals:   09/16/13 0501 09/16/13 1348 09/16/13 2134 09/17/13 0546  BP: 169/69 126/81 146/53 149/55  Pulse: 86 95 90 87  Temp: 98.4 F (36.9 C) 97.9 F (36.6 C) 98.7 F (37.1 C) 98.8 F (37.1 C)  TempSrc: Oral Oral Oral Oral  Resp: 16 17 18 18   Height:      Weight:      SpO2: 96% 97% 94% 95%    Last BM Date: 09/14/13  Intake/Output   Yesterday:  07/01 0701 - 07/02 0700 In: -  Out: 16948[Urine:400; Emesis/NG output:1250] This shift:    I/O last 3 completed shifts: In: -  Out: 3350 [Urine:1150; Emesis/NG output:2200]    Physical Exam: General: Pt awake/alert/oriented x4 in no acute distress Abdomen: Soft.  Nondistended. Tender to epigastric region.  No evidence of peritonitis.  No incarcerated hernias.   Problem List:   Principal Problem:   SBO (small bowel obstruction) Active Problems:   Hypertension   CAD (coronary artery disease)   Hyperlipidemia   Type II or unspecified type diabetes mellitus with renal manifestations, uncontrolled   Breast cancer of lower-inner quadrant of right female breast    Results:   Labs: Results for orders placed during the hospital encounter of 09/15/13 (from the past 448hour(s))  URINALYSIS, ROUTINE W REFLEX MICROSCOPIC     Status: Abnormal   Collection Time    09/15/13 10:21 AM      Result Value Ref Range   Color, Urine YELLOW  YELLOW   APPearance CLEAR  CLEAR   Specific Gravity, Urine 1.031 (*) 1.005 - 1.030   pH 7.0  5.0 - 8.0   Glucose, UA 250 (*) NEGATIVE mg/dL   Hgb urine dipstick NEGATIVE  NEGATIVE   Bilirubin Urine NEGATIVE  NEGATIVE   Ketones, ur NEGATIVE  NEGATIVE mg/dL   Protein, ur NEGATIVE  NEGATIVE mg/dL   Urobilinogen, UA 0.2  0.0 - 1.0 mg/dL   Nitrite NEGATIVE  NEGATIVE    Leukocytes, UA NEGATIVE  NEGATIVE   Comment: MICROSCOPIC NOT DONE ON URINES WITH NEGATIVE PROTEIN, BLOOD, LEUKOCYTES, NITRITE, OR GLUCOSE <1000 mg/dL.  CBC     Status: Abnormal   Collection Time    09/15/13 11:20 AM      Result Value Ref Range   WBC 8.7  4.0 - 10.5 K/uL   RBC 3.90  3.87 - 5.11 MIL/uL   Hemoglobin 10.0 (*) 12.0 - 15.0 g/dL   HCT 31.4 (*) 36.0 - 46.0 %   MCV 80.5  78.0 - 100.0 fL   MCH 25.6 (*) 26.0 - 34.0 pg   MCHC 31.8  30.0 - 36.0 g/dL   RDW 15.1  11.5 - 15.5 %   Platelets 237  150 - 400 K/uL  CREATININE, SERUM     Status: Abnormal   Collection Time    09/15/13 11:20 AM      Result Value Ref Range   Creatinine, Ser 1.30 (*) 0.50 - 1.10 mg/dL   GFR calc non Af Amer 42 (*) >90 mL/min   GFR calc Af Amer 48 (*) >90 mL/min   Comment: (NOTE)     The eGFR has been calculated using the CKD EPI equation.     This calculation has not been validated  in all clinical situations.     eGFR's persistently <90 mL/min signify possible Chronic Kidney     Disease.  GLUCOSE, CAPILLARY     Status: Abnormal   Collection Time    09/15/13 11:51 AM      Result Value Ref Range   Glucose-Capillary 202 (*) 70 - 99 mg/dL  GLUCOSE, CAPILLARY     Status: Abnormal   Collection Time    09/15/13  4:12 PM      Result Value Ref Range   Glucose-Capillary 176 (*) 70 - 99 mg/dL  GLUCOSE, CAPILLARY     Status: Abnormal   Collection Time    09/15/13  7:31 PM      Result Value Ref Range   Glucose-Capillary 178 (*) 70 - 99 mg/dL  GLUCOSE, CAPILLARY     Status: Abnormal   Collection Time    09/15/13 10:56 PM      Result Value Ref Range   Glucose-Capillary 148 (*) 70 - 99 mg/dL  GLUCOSE, CAPILLARY     Status: Abnormal   Collection Time    09/16/13 12:33 AM      Result Value Ref Range   Glucose-Capillary 127 (*) 70 - 99 mg/dL   Comment 1 Notify RN    BASIC METABOLIC PANEL     Status: Abnormal   Collection Time    09/16/13 12:56 AM      Result Value Ref Range   Sodium 140  137 - 147  mEq/L   Potassium 4.0  3.7 - 5.3 mEq/L   Chloride 99  96 - 112 mEq/L   CO2 26  19 - 32 mEq/L   Glucose, Bld 163 (*) 70 - 99 mg/dL   BUN 19  6 - 23 mg/dL   Creatinine, Ser 1.29 (*) 0.50 - 1.10 mg/dL   Calcium 9.4  8.4 - 10.5 mg/dL   GFR calc non Af Amer 42 (*) >90 mL/min   GFR calc Af Amer 49 (*) >90 mL/min   Comment: (NOTE)     The eGFR has been calculated using the CKD EPI equation.     This calculation has not been validated in all clinical situations.     eGFR's persistently <90 mL/min signify possible Chronic Kidney     Disease.  CBC     Status: Abnormal   Collection Time    09/16/13 12:56 AM      Result Value Ref Range   WBC 9.4  4.0 - 10.5 K/uL   RBC 4.00  3.87 - 5.11 MIL/uL   Hemoglobin 10.3 (*) 12.0 - 15.0 g/dL   HCT 32.0 (*) 36.0 - 46.0 %   MCV 80.0  78.0 - 100.0 fL   MCH 25.8 (*) 26.0 - 34.0 pg   MCHC 32.2  30.0 - 36.0 g/dL   RDW 15.2  11.5 - 15.5 %   Platelets 268  150 - 400 K/uL  GLUCOSE, CAPILLARY     Status: Abnormal   Collection Time    09/16/13  4:07 AM      Result Value Ref Range   Glucose-Capillary 212 (*) 70 - 99 mg/dL  GLUCOSE, CAPILLARY     Status: Abnormal   Collection Time    09/16/13  8:02 AM      Result Value Ref Range   Glucose-Capillary 227 (*) 70 - 99 mg/dL  GLUCOSE, CAPILLARY     Status: Abnormal   Collection Time    09/16/13 11:48 AM      Result Value  Ref Range   Glucose-Capillary 199 (*) 70 - 99 mg/dL  GLUCOSE, CAPILLARY     Status: Abnormal   Collection Time    09/16/13  4:00 PM      Result Value Ref Range   Glucose-Capillary 225 (*) 70 - 99 mg/dL  BASIC METABOLIC PANEL     Status: Abnormal   Collection Time    09/16/13  6:45 PM      Result Value Ref Range   Sodium 144  137 - 147 mEq/L   Potassium 3.9  3.7 - 5.3 mEq/L   Chloride 102  96 - 112 mEq/L   CO2 29  19 - 32 mEq/L   Glucose, Bld 168 (*) 70 - 99 mg/dL   BUN 18  6 - 23 mg/dL   Creatinine, Ser 1.33 (*) 0.50 - 1.10 mg/dL   Calcium 9.4  8.4 - 10.5 mg/dL   GFR calc non Af  Amer 41 (*) >90 mL/min   GFR calc Af Amer 47 (*) >90 mL/min   Comment: (NOTE)     The eGFR has been calculated using the CKD EPI equation.     This calculation has not been validated in all clinical situations.     eGFR's persistently <90 mL/min signify possible Chronic Kidney     Disease.   Anion gap 13  5 - 15  GLUCOSE, CAPILLARY     Status: Abnormal   Collection Time    09/16/13  8:10 PM      Result Value Ref Range   Glucose-Capillary 146 (*) 70 - 99 mg/dL  GLUCOSE, CAPILLARY     Status: Abnormal   Collection Time    09/16/13 10:04 PM      Result Value Ref Range   Glucose-Capillary 158 (*) 70 - 99 mg/dL  GLUCOSE, CAPILLARY     Status: Abnormal   Collection Time    09/17/13 12:02 AM      Result Value Ref Range   Glucose-Capillary 168 (*) 70 - 99 mg/dL  BASIC METABOLIC PANEL     Status: Abnormal   Collection Time    09/17/13  3:35 AM      Result Value Ref Range   Sodium 143  137 - 147 mEq/L   Potassium 3.8  3.7 - 5.3 mEq/L   Chloride 102  96 - 112 mEq/L   CO2 26  19 - 32 mEq/L   Glucose, Bld 181 (*) 70 - 99 mg/dL   BUN 19  6 - 23 mg/dL   Creatinine, Ser 1.31 (*) 0.50 - 1.10 mg/dL   Calcium 9.3  8.4 - 10.5 mg/dL   GFR calc non Af Amer 41 (*) >90 mL/min   GFR calc Af Amer 48 (*) >90 mL/min   Comment: (NOTE)     The eGFR has been calculated using the CKD EPI equation.     This calculation has not been validated in all clinical situations.     eGFR's persistently <90 mL/min signify possible Chronic Kidney     Disease.   Anion gap 15  5 - 15  GLUCOSE, CAPILLARY     Status: Abnormal   Collection Time    09/17/13  3:56 AM      Result Value Ref Range   Glucose-Capillary 171 (*) 70 - 99 mg/dL  GLUCOSE, CAPILLARY     Status: Abnormal   Collection Time    09/17/13  7:49 AM      Result Value Ref Range   Glucose-Capillary 200 (*)  70 - 99 mg/dL    Imaging / Studies: Dg Abd 2 Views  09/17/2013   CLINICAL DATA:  Recent small bowel obstruction, improving clinically  EXAM:  ABDOMEN - 2 VIEW  COMPARISON:  Abdominal series of September 16, 2013  FINDINGS: There remain loops of mildly distended gas and fluid-filled small bowel in the mid and lower abdomen. There is contrast within the right colon. There is a moderate stool burden in the transverse and descending portions of the colon. There is gas in the rectum there surgical clips in the gallbladder fossa and more laterally in the right upper quadrant. The esophagogastric tube's proximal port lies at or above the GE junction. Advancement by 10 cm is recommended  IMPRESSION: There has been slight interval decrease in the volume of small bowel gas and fluid which may indicate ongoing resolution of the small bowel obstruction. Advancement of the nasogastric tube by 10 cm is recommended.   Electronically Signed   By: David  [name]   On: 09/17/2013 07:56   Dg Abd 2 Views  09/16/2013   CLINICAL DATA:  Small bowel obstruction.  EXAM: ABDOMEN - 2 VIEW  COMPARISON:  CT scan and radiographs of September 15, 2013.  FINDINGS: Nasogastric tube tip is seen at the expected level of the gastroesophageal junction, with side hole probably above the gastroesophageal junction. Status post cholecystectomy. Mildly dilated small bowel loops with air-fluid levels are noted concerning for distal small bowel obstruction. Stool is noted in the colon which is nondilated.  IMPRESSION: Mildly dilated small bowel loops with air-fluid levels are noted concerning for distal small bowel obstruction. Nasogastric tube tip is at the expected position of thr gastroesophageal junction.   Electronically Signed   By: Sabino Dick M.D.   On: 09/16/2013 08:04    Scheduled Meds: . heparin  5,000 Units Subcutaneous 3 times per day  . insulin aspart  0-15 Units Subcutaneous 6 times per day  . insulin aspart  0-5 Units Subcutaneous QHS   Continuous Infusions: . dextrose 5 % and 0.9% NaCl 75 mL/hr at 09/17/13 0705   PRN Meds:.morphine injection, ondansetron (ZOFRAN) IV,  phenol   Antibiotics: Anti-infectives   None      Assessment/Plan:  pSBO -passing flatus, no BM, minimal pain, but NGT output is high.  AXR is improving.  Will continue with NGT decompression and consider clamping trial when output decreases.  I suspect she will open up soon.  -she needs to be mobilized as much as possible -add IVF since shes NPO -monitor electrolytes  Erby Pian, [facility] Surgery Pager [phone] Office [phone]  09/17/2013 9:13 AM

## 2013-09-17 NOTE — Progress Notes (Addendum)
Subjective: [name] had no events overnight. Her nausea is improved and she has not requested zofran in the past day. She also thinks her abdominal discomfort has improved. She is passing flatus with no bowel movements. She woke up this morning with left ear pain she describes as 4/10 throbbing pain that is unchanged since she first noticed it. She denies any fevers, chills, night sweats, ear discharge, chest pain, abdominal pain, or SOB.  Vital signs in last 24 hours: Filed Vitals:   09/16/13 0501 09/16/13 1348 09/16/13 2134 09/17/13 0546  BP: 169/69 126/81 146/53 149/55  Pulse: 86 95 90 87  Temp: 98.4 F (36.9 C) 97.9 F (36.6 C) 98.7 F (37.1 C) 98.8 F (37.1 C)  TempSrc: Oral Oral Oral Oral  Resp: 16 17 18 18   Height:      Weight:      SpO2: 96% 97% 94% 95%   Weight change:   Intake/Output Summary (Last 24 hours) at 09/17/13 0711 Last data filed at 09/17/13 0551  Gross per 24 hour  Intake      0 ml  Output   1650 ml  Net  -1650 ml  Emesis/NG output: 6/30 1400 mL, 7/1 300 mL  BP 149/55  Pulse 87  Temp(Src) 98.8 F (37.1 C) (Oral)  Resp 18  Ht 5' 4"  (1.626 m)  Wt 191 lb (86.637 kg)  BMI 32.77 kg/m2  SpO2 95%  General Appearance:    Alert, cooperative, no acute distress  HEENT:    Normocephalic, without obvious abnormality, atraumatic, NG tube in place with dark brown fluid suctioned out. Oropharynx dry.Left ear tender to manipulation with much wax buildup. Right ear TM clear and non-tender.  Lungs:     Clear to auscultation bilaterally, respirations unlabored   Heart:    Regular rate and rhythm, S1 and S2 normal, no murmur, rub   or gallop  Abdomen:     Soft, tender to deep palpation in RU and LU quadrants, faint bowel sounds noted. No masses, no organomegaly, several previous incision scars on the mid-lower abdomen  Extremities:   Extremities normal, atraumatic, no cyanosis or edema. 2+ radial and DP pulses bilaterally.    Lab Results: BMET    Component  Value Date/Time   NA 143 09/17/2013 0335   NA 137 08/06/2013 1518   K 3.8 09/17/2013 0335   K 4.1 08/06/2013 1518   CL 102 09/17/2013 0335   CO2 26 09/17/2013 0335   CO2 24 08/06/2013 1518   GLUCOSE 181* 09/17/2013 0335   GLUCOSE 283* 08/06/2013 1518   BUN 19 09/17/2013 0335   BUN 29.2* 08/06/2013 1518   CREATININE 1.31* 09/17/2013 0335   CREATININE 1.8* 08/06/2013 1518   CREATININE 1.16* 12/11/2011 1125   CALCIUM 9.3 09/17/2013 0335   CALCIUM 9.6 08/06/2013 1518   GFRNONAA 41* 09/17/2013 0335   GFRNONAA 50* 12/11/2011 1125   GFRAA 48* 09/17/2013 0335   GFRAA 57* 12/11/2011 1125    CBC    Component Value Date/Time   WBC 9.4 09/16/2013 0056   WBC 6.4 08/06/2013 1518   RBC 4.00 09/16/2013 0056   RBC 3.50* 08/06/2013 1518   HGB 10.3* 09/16/2013 0056   HGB 8.6* 08/06/2013 1518   HCT 32.0* 09/16/2013 0056   HCT 27.3* 08/06/2013 1518   PLT 268 09/16/2013 0056   PLT 296 08/06/2013 1518   MCV 80.0 09/16/2013 0056   MCV 78.0* 08/06/2013 1518   MCH 25.8* 09/16/2013 0056   MCH 24.7* 08/06/2013 1518  MCHC 32.2 09/23/13 0056   MCHC 31.7 08/06/2013 1518   RDW 15.2 September 23, 2013 0056   RDW 14.6* 08/06/2013 1518   LYMPHSABS 1.2 09/15/2013 0308   LYMPHSABS 1.3 08/06/2013 1518   MONOABS 0.4 09/15/2013 0308   MONOABS 0.6 08/06/2013 1518   EOSABS 0.1 09/15/2013 0308   EOSABS 0.1 08/06/2013 1518   BASOSABS 0.0 09/15/2013 0308   BASOSABS 0.0 08/06/2013 1518     Micro Results: No results found for this or any previous visit (from the past 240 hour(s)). Studies/Results: Dg Abd 2 Views  09-23-13   CLINICAL DATA:  Small bowel obstruction.  EXAM: ABDOMEN - 2 VIEW  COMPARISON:  CT scan and radiographs of September 15, 2013.  FINDINGS: Nasogastric tube tip is seen at the expected level of the gastroesophageal junction, with side hole probably above the gastroesophageal junction. Status post cholecystectomy. Mildly dilated small bowel loops with air-fluid levels are noted concerning for distal small bowel obstruction. Stool is noted in the colon which is  nondilated.  IMPRESSION: Mildly dilated small bowel loops with air-fluid levels are noted concerning for distal small bowel obstruction. Nasogastric tube tip is at the expected position of thr gastroesophageal junction.   Electronically Signed   By: Sabino Dick M.D.   On: 09/23/2013 08:04   Medications: I have reviewed the patient's current medications. Scheduled Meds: . heparin  5,000 Units Subcutaneous 3 times per day  . insulin aspart  0-15 Units Subcutaneous 6 times per day  . insulin aspart  0-5 Units Subcutaneous QHS   Continuous Infusions: . dextrose 5 % and 0.9% NaCl 75 mL/hr at 09/17/13 0705   PRN Meds:.morphine injection, ondansetron (ZOFRAN) IV, phenol  Assessment/Plan:  1. Small bowel obstruction: As found on CT and serial abdominal XR. Patient is clinically improving and show slight interval improvement on plain films. NG output continues >1 L so hold off clamping for now.  -Appreciate surgery recs -NGT to wall suction for now with bowel rest  -Advance NG tube 10 cm per radiology recs -Continue to monitor NG output and will not clamp tube for now as there is continued output -Ketorolac 15 mg IV q6hprn for pain -NPO with 100 cc/hr D5 NS  -serial AXR  2. CAD with stable angina: followed by Dr. Johnsie Cancel of [facility] cardiology, currently no chest pain  -continue to hold Plavix in setting of possible surgery  3. DM Type 2: hold Lantus and meal coverage while NPO, last HgbA1c >10  -cover with SSI   4. Stage 1 Invasive ductal carcinoma (right breast): s/p lumpectomy, radiation therapy, managed with anastrozole currently   5. CKD Stage 3: at baseline creatinine (currently 1.3)   6. Hypertension: currently 126/81 pulse 95 bpm  -currently NPO  -consider antihypertensives per NGT   7. Anemia: stable at baseline Hgb (10.3)  8. Congestion: Likely seasonal allergies as she has stopped taking loratidine since admission -diphenhydramine 25 mg iv q6hprn  DVT ppx: Hep SQ  CODE  FULL   Dispo: Disposition is deferred at this time, awaiting improvement of current medical problems.  Anticipated discharge in approximately 2 day(s).   The patient does have a current PCP Bartholomew Crews, MD) and does need an [facility] hospital follow-up appointment after discharge.  The patient does not know have transportation limitations that hinder transportation to clinic appointments.  .Services Needed at time of discharge: Y = Yes, Blank = No PT:   OT:   RN:   Equipment:   Other:  LOS: 2 days   Kelby Aline, MD 09/17/2013, 7:11 AM Attending physician note: I examined this patient and reviewed her status with her 2 daughters who are currently present along with the health care team. Physical findings, management plan, accurate as recorded above by resident physician Dr. Lottie Mussel. Current x-rays reviewed. We will continue conservative management for her small bowel obstruction. Overall she is clinically stable and we will try to defer surgery if possible.  Murriel Hopper, MD, [place]  Hematology-Oncology/Internal Medicine

## 2013-09-17 NOTE — Progress Notes (Signed)
Passing some gas and there is some improvement in xray. NGT output still somewhat thick. Continue NGT. Patient examined and I agree with the assessment and plan  Georganna Skeans, MD, MPH, FACS Trauma: [phone] General Surgery: [phone]  09/17/2013 11:15 AM

## 2013-09-18 LAB — GLUCOSE, CAPILLARY
GLUCOSE-CAPILLARY: 206 mg/dL — AB (ref 70–99)
GLUCOSE-CAPILLARY: 219 mg/dL — AB (ref 70–99)
Glucose-Capillary: 199 mg/dL — ABNORMAL HIGH (ref 70–99)
Glucose-Capillary: 204 mg/dL — ABNORMAL HIGH (ref 70–99)
Glucose-Capillary: 230 mg/dL — ABNORMAL HIGH (ref 70–99)
Glucose-Capillary: 211 mg/dL — ABNORMAL HIGH (ref 70–99)
Glucose-Capillary: 167 mg/dL — ABNORMAL HIGH (ref 70–99)

## 2013-09-18 LAB — BASIC METABOLIC PANEL
Anion gap: 13 (ref 5–15)
BUN: 16 mg/dL (ref 6–23)
CHLORIDE: 108 meq/L (ref 96–112)
CO2: 26 mEq/L (ref 19–32)
Calcium: 8 mg/dL — ABNORMAL LOW (ref 8.4–10.5)
Creatinine, Ser: 1.17 mg/dL — ABNORMAL HIGH (ref 0.50–1.10)
GFR calc Af Amer: 55 mL/min — ABNORMAL LOW (ref >90)
GFR calc non Af Amer: 47 mL/min — ABNORMAL LOW (ref >90)
GLUCOSE: 650 mg/dL — AB (ref 70–99)
POTASSIUM: 3.4 meq/L — AB (ref 3.7–5.3)
Sodium: 147 mEq/L (ref 137–147)

## 2013-09-18 MED ORDER — POTASSIUM CHLORIDE 10 MEQ/100ML IV SOLN
10.0000 meq | INTRAVENOUS | Status: AC
  Administered 2013-09-18 (×2): 10 meq via INTRAVENOUS
  Filled 2013-09-18: qty 100

## 2013-09-18 MED ORDER — AMLODIPINE BESYLATE 5 MG PO TABS
5.0000 mg | ORAL_TABLET | Freq: Every day | ORAL | Status: DC
  Administered 2013-09-18: 5 mg via ORAL
  Filled 2013-09-18 (×2): qty 1

## 2013-09-18 NOTE — Progress Notes (Signed)
Patient ID: [name], female   DOB: [birth year], 68 y.o.   MRN: [id number]  [facility] - Progress Note  HD# 4  Subjective: Patient without complaint.  Wants NG out.  Ate a popsicle this AM.  Daughter at bedside.  Objective: Vital signs in last 24 hours: Temp:  [98.3 F (36.8 C)-98.9 F (37.2 C)] 98.7 F (37.1 C) (07/03 0545) Pulse Rate:  [77-81] 77 (07/03 0545) Resp:  [16-18] 18 (07/03 0545) BP: (145-172)/(56-64) 147/64 mmHg (07/03 0545) SpO2:  [94 %-97 %] 96 % (07/03 0545) Last BM Date: 09/14/13  Intake/Output from previous day: September 22, 2022 0701 - 07/03 0700 In: 2150.8 [I.V.:2150.8] Out: 950 [Emesis/NG output:950]  Exam: HEENT - clear, not icteric Neck - soft Chest - clear bilaterally Cor - RRR, no murmur Abd - soft, mild diffuse tenderness; BS present; no BM Ext - no significant edema Neuro - grossly intact, no focal deficits  Lab Results:   Recent Labs  09/15/13 1120 09/16/13 0056  WBC 8.7 9.4  HGB 10.0* 10.3*  HCT 31.4* 32.0*  PLT 237 268     Recent Labs  09-21-2013 0335 09/18/13 0520  NA 143 147  K 3.8 3.4*  CL 102 108  CO2 26 26  GLUCOSE 181* 650*  BUN 19 16  CREATININE 1.31* 1.17*  CALCIUM 9.3 8.0*    Studies/Results: Dg Abd 2 Views  09/21/2013   CLINICAL DATA:  Recent small bowel obstruction, improving clinically  EXAM: ABDOMEN - 2 VIEW  COMPARISON:  Abdominal series of September 16, 2013  FINDINGS: There remain loops of mildly distended gas and fluid-filled small bowel in the mid and lower abdomen. There is contrast within the right colon. There is a moderate stool burden in the transverse and descending portions of the colon. There is gas in the rectum there surgical clips in the gallbladder fossa and more laterally in the right upper quadrant. The esophagogastric tube's proximal port lies at or above the GE junction. Advancement by 10 cm is recommended  IMPRESSION: There has been slight interval decrease in the volume  of small bowel gas and fluid which may indicate ongoing resolution of the small bowel obstruction. Advancement of the nasogastric tube by 10 cm is recommended.   Electronically Signed   By: David  [name]   On: 09/21/13 07:56    Assessment / Plan: 1.  Partial SBO  Will clamp NG tube today  Encouraged OOB, ambulation  Sips of clear liquids  DM management per IM service  Earnstine Regal, MD, [facility] Office: [phone]  09/18/2013

## 2013-09-18 NOTE — Progress Notes (Signed)
Inpatient Diabetes Program Recommendations  AACE/ADA: New Consensus Statement on Inpatient Glycemic Control (2013)  Target Ranges:  Prepandial:   less than 140 mg/dL      Peak postprandial:   less than 180 mg/dL (1-2 hours)      Critically ill patients:  140 - 180 mg/dL    Results for JERRIS, KELTZ (MRN [id number]) as of 09/18/2013 10:18  Ref. Range 09/17/2013 00:02 09/17/2013 03:56 09/17/2013 07:49 09/17/2013 11:52 09/17/2013 16:03 09/17/2013 20:25  Glucose-Capillary Latest Range: 70-99 mg/dL 168 (H) 171 (H) 200 (H) 230 (H) 213 (H) 189 (H)    Results for MICHAELEEN, DOWN (MRN [id number]) as of 09/18/2013 10:18  Ref. Range 09/18/2013 00:10 09/18/2013 04:00 09/18/2013 06:39 09/18/2013 07:29  Glucose-Capillary Latest Range: 70-99 mg/dL 219 (H) 206 (H) 204 (H) 199 (H)      Admit with SBO. History of DM, HTN, CVA.    Home DM Meds:   Lantus 36 units QHS  Humalog 3 units tidwc  Humalog SSI     CBGs elevated >200 mg/dl. Currently only getting Novolog Moderate SSI.    MD- Please consider adding a portion of patient's home dose of Lantus- Could start with 1/2 home dose and titrate- Lantus 18 units QHS     Will follow Wyn Quaker RN, MSN, CDE Diabetes Coordinator Inpatient Diabetes Program Team Pager: [phone] (8a-10p)

## 2013-09-18 NOTE — Progress Notes (Addendum)
Subjective: Ms. Revard had no events overnight. She reports some mild nausea which may be related to her hunger, but no emesis. Her abdominal pain has resolved and she is passing flatus with no bowel movements since 6/29. Her ear pain resolves with ketorolac. She denies any fevers, chills, night sweats, chest pain, abdominal pain, or SOB.  Vital signs in last 24 hours: Filed Vitals:   09/17/13 0546 09/17/13 1306 09/17/13 2021 09/18/13 0545  BP: 149/55 172/58 145/56 147/64  Pulse: 87 79 81 77  Temp: 98.8 F (37.1 C) 98.3 F (36.8 C) 98.9 F (37.2 C) 98.7 F (37.1 C)  TempSrc: Oral Oral Oral Oral  Resp: 18 16 16 18   Height:      Weight:      SpO2: 95% 97% 94% 96%   Weight change:   Intake/Output Summary (Last 24 hours) at 09/18/13 1041 Last data filed at 09/18/13 0933  Gross per 24 hour  Intake 2150.83 ml  Output   1750 ml  Net 400.83 ml  Emesis/NG output: 6/30 1400 mL, 7/1 1250 mL, 7/2 950 mL  BP 147/64  Pulse 77  Temp(Src) 98.7 F (37.1 C) (Oral)  Resp 18  Ht 5' 4"  (1.626 m)  Wt 191 lb (86.637 kg)  BMI 32.77 kg/m2  SpO2 96%  General Appearance:    Alert, cooperative, no acute distress  HEENT:    Normocephalic, without obvious abnormality, atraumatic, NG tube in place with dark brown/green fluid suctioned out.  Lungs:     Clear to auscultation bilaterally, respirations unlabored   Heart:    Regular rate and rhythm, S1 and S2 normal, no murmur, rub   or gallop  Abdomen:     Soft, non-tender, no bowel sounds noted. No masses, no organomegaly, several previous incision scars on the mid-lower abdomen  Extremities:   Extremities normal, atraumatic, no cyanosis or edema. 2+ radial and DP pulses bilaterally.    Lab Results: BMET    Component Value Date/Time   NA 147 09/18/2013 0520   NA 137 08/06/2013 1518   K 3.4* 09/18/2013 0520   K 4.1 08/06/2013 1518   CL 108 09/18/2013 0520   CO2 26 09/18/2013 0520   CO2 24 08/06/2013 1518   GLUCOSE 650* 09/18/2013 0520   GLUCOSE 283*  08/06/2013 1518   BUN 16 09/18/2013 0520   BUN 29.2* 08/06/2013 1518   CREATININE 1.17* 09/18/2013 0520   CREATININE 1.8* 08/06/2013 1518   CREATININE 1.16* 12/11/2011 1125   CALCIUM 8.0* 09/18/2013 0520   CALCIUM 9.6 08/06/2013 1518   GFRNONAA 47* 09/18/2013 0520   GFRNONAA 50* 12/11/2011 1125   GFRAA 55* 09/18/2013 0520   GFRAA 57* 12/11/2011 1125   POC Glucose 199 @ 7:29 am, 204 @ 6:39 am   CBC    Component Value Date/Time   WBC 9.4 09/16/2013 0056   WBC 6.4 08/06/2013 1518   RBC 4.00 09/16/2013 0056   RBC 3.50* 08/06/2013 1518   HGB 10.3* 09/16/2013 0056   HGB 8.6* 08/06/2013 1518   HCT 32.0* 09/16/2013 0056   HCT 27.3* 08/06/2013 1518   PLT 268 09/16/2013 0056   PLT 296 08/06/2013 1518   MCV 80.0 09/16/2013 0056   MCV 78.0* 08/06/2013 1518   MCH 25.8* 09/16/2013 0056   MCH 24.7* 08/06/2013 1518   MCHC 32.2 09/16/2013 0056   MCHC 31.7 08/06/2013 1518   RDW 15.2 09/16/2013 0056   RDW 14.6* 08/06/2013 1518   LYMPHSABS 1.2 09/15/2013 0308   LYMPHSABS 1.3  08/06/2013 1518   MONOABS 0.4 09/15/2013 0308   MONOABS 0.6 08/06/2013 1518   EOSABS 0.1 09/15/2013 0308   EOSABS 0.1 08/06/2013 1518   BASOSABS 0.0 09/15/2013 0308   BASOSABS 0.0 08/06/2013 1518     Micro Results: No results found for this or any previous visit (from the past 240 hour(s)). Studies/Results: Dg Abd 2 Views  September 20, 2013   CLINICAL DATA:  Recent small bowel obstruction, improving clinically  EXAM: ABDOMEN - 2 VIEW  COMPARISON:  Abdominal series of September 16, 2013  FINDINGS: There remain loops of mildly distended gas and fluid-filled small bowel in the mid and lower abdomen. There is contrast within the right colon. There is a moderate stool burden in the transverse and descending portions of the colon. There is gas in the rectum there surgical clips in the gallbladder fossa and more laterally in the right upper quadrant. The esophagogastric tube's proximal port lies at or above the GE junction. Advancement by 10 cm is recommended  IMPRESSION: There has been  slight interval decrease in the volume of small bowel gas and fluid which may indicate ongoing resolution of the small bowel obstruction. Advancement of the nasogastric tube by 10 cm is recommended.   Electronically Signed   By: David  [name]   On: 09-20-13 07:56   Medications: I have reviewed the patient's current medications. Scheduled Meds: . heparin  5,000 Units Subcutaneous 3 times per day  . insulin aspart  0-15 Units Subcutaneous 6 times per day  . insulin aspart  0-5 Units Subcutaneous QHS   Continuous Infusions: . dextrose 5 % and 0.9% NaCl 100 mL/hr at 09/18/13 0533   PRN Meds:.diphenhydrAMINE, ketorolac, morphine injection, ondansetron (ZOFRAN) IV, phenol  Assessment/Plan:  1. Small bowel obstruction: As found on CT and serial abdominal XR. Patient is clinically improving as she continues to pass flatus and denies abdominal pain or tenderness on exam. NG output has decreased each day. Patient given ice pop during rounds. Surgery has clamped NG tube. -Appreciate surgery recs -Observe tolerance of NGT clamp and clear liquid sips -Ketorolac 15 mg IV q6hprn for pain -100 cc/hr D5 NS   2. CAD with stable angina: followed by Dr. Johnsie Cancel of [facility] cardiology, currently no chest pain  -continue to hold Plavix in setting of possible surgery  3. DM Type 2:  BMET glucose >600 drawn off of arm with D5 IV infusion. Subsequent capillary glucose checks ~200. Hold Lantus and meal coverage while diet restricted, last HgbA1c >10  -cover with SSI   4. Stage 1 Invasive ductal carcinoma (right breast): s/p lumpectomy, radiation therapy, managed with anastrozole currently   5. CKD Stage 3: at baseline creatinine (currently 1.3)   6. Hypertension: 140s/50s-60s -consider antihypertensives if hypertensive  7. Anemia: stable at baseline Hgb (10.3)  8. Congestion: Likely seasonal allergies as she has stopped taking loratidine since admission -diphenhydramine 25 mg iv q6hprn  DVT ppx: Hep SQ    CODE FULL   Dispo: Disposition is deferred at this time, awaiting improvement of current medical problems.  Anticipated discharge in approximately 2 day(s).   The patient does have a current PCP Bartholomew Crews, MD) and does need an [facility] hospital follow-up appointment after discharge.  The patient does not know have transportation limitations that hinder transportation to clinic appointments.  .Services Needed at time of discharge: Y = Yes, Blank = No PT:   OT:   RN:   Equipment:   Other:     LOS: 3 days  Kelby Aline, MD 09/18/2013, 10:41 AM  Attending physician note: Patient examined together with resident physician Dr. Lottie Mussel. Current status, physical findings, active medical problems, and management plan accurate as recorded above. Very slow progress. Nasogastric drainage slowly decreasing but no significant bowel activity except for occasional ability to pass gas. Abdomen remains soft and nontender but bowel sounds are absent. Continue conservative therapy for now.  Murriel Hopper, MD, [place]-Oncology/Internal Medicine

## 2013-09-18 NOTE — Progress Notes (Signed)
MD on call notified of pt bp 170/63 hr 75 new orders received will continue to monitor. Arthor Captain LPN

## 2013-09-18 NOTE — Progress Notes (Signed)
Received call from lab,pt with critical blood glucose of 650, fingerstick  Was 204, md on call notified, no new order at this time.

## 2013-09-18 NOTE — Progress Notes (Signed)
UR completed.  Bulmaro Feagans, RN BSN [facility] CCM Trauma/Neuro ICU Case Manager [phone]  

## 2013-09-19 LAB — BASIC METABOLIC PANEL
Anion gap: 13 (ref 5–15)
BUN: 12 mg/dL (ref 6–23)
CHLORIDE: 106 meq/L (ref 96–112)
CO2: 25 mEq/L (ref 19–32)
Calcium: 9.3 mg/dL (ref 8.4–10.5)
Creatinine, Ser: 1.22 mg/dL — ABNORMAL HIGH (ref 0.50–1.10)
GFR calc Af Amer: 52 mL/min — ABNORMAL LOW (ref >90)
GFR calc non Af Amer: 45 mL/min — ABNORMAL LOW (ref >90)
GLUCOSE: 191 mg/dL — AB (ref 70–99)
POTASSIUM: 3.9 meq/L (ref 3.7–5.3)
SODIUM: 144 meq/L (ref 137–147)

## 2013-09-19 LAB — GLUCOSE, CAPILLARY
GLUCOSE-CAPILLARY: 178 mg/dL — AB (ref 70–99)
GLUCOSE-CAPILLARY: 147 mg/dL — AB (ref 70–99)
Glucose-Capillary: 147 mg/dL — ABNORMAL HIGH (ref 70–99)
Glucose-Capillary: 193 mg/dL — ABNORMAL HIGH (ref 70–99)
Glucose-Capillary: 201 mg/dL — ABNORMAL HIGH (ref 70–99)
Glucose-Capillary: 228 mg/dL — ABNORMAL HIGH (ref 70–99)

## 2013-09-19 LAB — MAGNESIUM: MAGNESIUM: 1.8 mg/dL (ref 1.5–2.5)

## 2013-09-19 MED ORDER — LISINOPRIL 10 MG PO TABS
10.0000 mg | ORAL_TABLET | Freq: Every day | ORAL | Status: DC
  Administered 2013-09-19 – 2013-09-21 (×3): 10 mg via ORAL
  Filled 2013-09-19 (×3): qty 1

## 2013-09-19 MED ORDER — CLOPIDOGREL BISULFATE 75 MG PO TABS
75.0000 mg | ORAL_TABLET | Freq: Every day | ORAL | Status: DC
  Administered 2013-09-19 – 2013-09-21 (×3): 75 mg via ORAL
  Filled 2013-09-19 (×3): qty 1

## 2013-09-19 MED ORDER — SODIUM CHLORIDE 0.9 % IV SOLN
INTRAVENOUS | Status: DC
  Administered 2013-09-19 (×2): via INTRAVENOUS

## 2013-09-19 MED ORDER — ASPIRIN EC 81 MG PO TBEC
81.0000 mg | DELAYED_RELEASE_TABLET | Freq: Every day | ORAL | Status: DC
  Administered 2013-09-19 – 2013-09-21 (×3): 81 mg via ORAL
  Filled 2013-09-19 (×3): qty 1

## 2013-09-19 MED ORDER — LORATADINE 10 MG PO TABS: 10.0000 mg | ORAL_TABLET | Freq: Every day | ORAL | Status: DC | PRN

## 2013-09-19 MED ORDER — LORATADINE 10 MG PO TABS
10.0000 mg | ORAL_TABLET | Freq: Every day | ORAL | Status: DC
Start: 2013-09-19 — End: 2013-09-19

## 2013-09-19 MED ORDER — INSULIN ASPART 100 UNIT/ML ~~LOC~~ SOLN
0.0000 [IU] | Freq: Three times a day (TID) | SUBCUTANEOUS | Status: DC
  Administered 2013-09-19: 2 [IU] via SUBCUTANEOUS
  Administered 2013-09-20: 3 [IU] via SUBCUTANEOUS
  Administered 2013-09-20: 2 [IU] via SUBCUTANEOUS
  Administered 2013-09-20 – 2013-09-21 (×3): 3 [IU] via SUBCUTANEOUS

## 2013-09-19 MED ORDER — ANASTROZOLE 1 MG PO TABS
1.0000 mg | ORAL_TABLET | Freq: Every day | ORAL | Status: DC
  Administered 2013-09-19 – 2013-09-21 (×3): 1 mg via ORAL
  Filled 2013-09-19 (×3): qty 1

## 2013-09-19 MED ORDER — AMLODIPINE BESYLATE 10 MG PO TABS
10.0000 mg | ORAL_TABLET | Freq: Every day | ORAL | Status: DC
  Administered 2013-09-19 – 2013-09-21 (×3): 10 mg via ORAL
  Filled 2013-09-19 (×3): qty 1
  Filled 2013-09-19: qty 2

## 2013-09-19 NOTE — Progress Notes (Signed)
Patient ID: [name], female   DOB: 07/18/1945, 68 y.o.   MRN: [id number]  [facility] - Progress Note  HD# 5   Subjective: Patient up in chair.  Denies abd pain.  No nausea overnight with NG off suction.  Wants to eat.  Objective: Vital signs in last 24 hours: Temp:  [98.2 F (36.8 C)-98.7 F (37.1 C)] 98.4 F (36.9 C) (07/04 0500) Pulse Rate:  [60-78] 76 (07/04 0500) Resp:  [16-18] 16 (07/04 0500) BP: (157-176)/(57-80) 157/62 mmHg (07/04 0500) SpO2:  [96 %-97 %] 96 % (07/04 0500) Last BM Date: 09/14/13  Intake/Output from previous day: 07/03 0701 - 07/04 0700 In: 3011 [P.O.:446; I.V.:2365; IV Piggyback:200] Out: 3400 [Urine:3150; Emesis/NG output:250]  Exam: HEENT - clear, not icteric Neck - soft Chest - clear bilaterally Cor - RRR, no murmur Abd - soft, obese; BS present; non-tender Ext - no significant edema Neuro - grossly intact, no focal deficits  Lab Results:  No results found for this basename: WBC, HGB, HCT, PLT,  in the last 72 hours   Recent Labs  09/18/13 0520 09/19/13 0410  NA 147 144  K 3.4* 3.9  CL 108 106  CO2 26 25  GLUCOSE 650* 191*  BUN 16 12  CREATININE 1.17* 1.22*  CALCIUM 8.0* 9.3    Studies/Results: No results found.  Assessment / Plan: 1. Partial SBO   Remove NG tube this AM  Trial clear liquid diet  Encouraged OOB, ambulation   DM & HTN management per IM service  Will follow with you  Earnstine Regal, MD, [facility] Office: [phone]  09/19/2013

## 2013-09-19 NOTE — Progress Notes (Signed)
Subjective: [name] was hypertensive at 170/63 overnight and had amlodipine 5 mg daily added. Overall, she generally feels much better. She has tolerated her clear sips diet without nausea, emesis or abdominal pain. She said she had the urge to defecate but no stool came out when she tried. She continues to pass flatus. She also denies any fevers, chills, night sweats, chest pain, or SOB.  Vital signs in last 24 hours: Filed Vitals:   09/18/13 2201 09/18/13 2329 09/19/13 0110 09/19/13 0500  BP: 170/63 176/80 165/61 157/62  Pulse: 75 60 78 76  Temp: 98.6 F (37 C)  98.7 F (37.1 C) 98.4 F (36.9 C)  TempSrc: Oral   Oral  Resp: 18  17 16   Height:      Weight:      SpO2: 96%  97% 96%   Weight change:   Intake/Output Summary (Last 24 hours) at 09/19/13 0957 Last data filed at 09/19/13 8588  Gross per 24 hour  Intake   2923 ml  Output   2350 ml  Net    573 ml  Emesis/NG output: 6/30 1400 mL, 7/1 1250 mL, 7/2 950 mL  BP 157/62  Pulse 76  Temp(Src) 98.4 F (36.9 C) (Oral)  Resp 16  Ht 5' 4"  (1.626 m)  Wt 191 lb (86.637 kg)  BMI 32.77 kg/m2  SpO2 96%  General Appearance:    Alert, cooperative, no acute distress  HEENT:    Normocephalic, without obvious abnormality, atraumatic, NG tube clamped. Moist mucous membranes  Lungs:     Clear to auscultation bilaterally, respirations unlabored   Heart:    Regular rate and rhythm, S1 and S2 normal, no murmur, rub   or gallop  Abdomen:     Soft, non-tender, bowel sounds noted in all four quadrants. No masses, no organomegaly, several previous incision scars on the mid-lower abdomen  Extremities:   Extremities normal, atraumatic, no cyanosis or edema. 2+ radial and DP pulses bilaterally.    Lab Results: BMET    Component Value Date/Time   NA 144 09/19/2013 0410   NA 137 08/06/2013 1518   K 3.9 09/19/2013 0410   K 4.1 08/06/2013 1518   CL 106 09/19/2013 0410   CO2 25 09/19/2013 0410   CO2 24 08/06/2013 1518   GLUCOSE 191* 09/19/2013  0410   GLUCOSE 283* 08/06/2013 1518   BUN 12 09/19/2013 0410   BUN 29.2* 08/06/2013 1518   CREATININE 1.22* 09/19/2013 0410   CREATININE 1.8* 08/06/2013 1518   CREATININE 1.16* 12/11/2011 1125   CALCIUM 9.3 09/19/2013 0410   CALCIUM 9.6 08/06/2013 1518   GFRNONAA 45* 09/19/2013 0410   GFRNONAA 50* 12/11/2011 1125   GFRAA 52* 09/19/2013 0410   GFRAA 57* 12/11/2011 1125     Micro Results: No results found for this or any previous visit (from the past 240 hour(s)). Studies/Results: No results found. Medications: I have reviewed the patient's current medications. Scheduled Meds: . amLODipine  10 mg Oral Daily  . heparin  5,000 Units Subcutaneous 3 times per day  . insulin aspart  0-15 Units Subcutaneous 6 times per day   Continuous Infusions: . sodium chloride     PRN Meds:.diphenhydrAMINE, ketorolac, morphine injection, ondansetron (ZOFRAN) IV, phenol  Assessment/Plan:  1. Small bowel obstruction: As found on CT and serial abdominal XR. Patient has clinically improved as she continues to pass flatus and feels the urge to have a bowel movement. She has bowel sounds and no more tenderness on exam.  NG clamped and clear sips diet tolerated well.  -Appreciate surgery recs -Advance diet to full clear -Pull NG tube and monitor for tolerance -Ketorolac 15 mg IV q6hprn for pain -100 cc/hr NS   2. Hypertension: Blood pressure elevated overnight. Night team began amlodipine 5 mg daily (home dose of 10 mg) -transition to home medications as tolerated, first by increasing amlodipine to 10 mg daily -consider resumption of home dose lisinopril, furosemide  3. Resolved hypokalemia: Yesterday K of 3.4 likely due to NG suction of gastric fluid. Patient received 40 mEq KCl yesterday and now K is 3.9 -cont to monitor  4. CAD with stable angina: followed by Dr. Johnsie Cancel of [facility] cardiology, currently no chest pain  -continue to hold Plavix in setting of possible surgery  5. DM Type 2:  Capillary glucose  checks high 100s. Hold Lantus and meal coverage while diet restricted, last HgbA1c >10  -cover with SSI  -d/c D5 in IVF as she is transitioning to full clear diet  6. Stage 1 Invasive ductal carcinoma (right breast): s/p lumpectomy, radiation therapy, managed with anastrozole currently   7. CKD Stage 3: at baseline creatinine (currently 1.2)   8. Anemia: stable at baseline Hgb (10.3)  9-. Congestion: Likely seasonal allergies as she has stopped taking loratidine since admission -diphenhydramine 25 mg iv q6hprn  DVT ppx: Hep SQ  CODE FULL   Dispo: Disposition is deferred at this time, awaiting improvement of current medical problems.  Anticipated discharge in approximately 2 day(s).   The patient does have a current PCP Bartholomew Crews, MD) and does need an [facility] hospital follow-up appointment after discharge.  The patient does not know have transportation limitations that hinder transportation to clinic appointments.  .Services Needed at time of discharge: Y = Yes, Blank = No PT:   OT:   RN:   Equipment:   Other:     LOS: 4 days   Kelby Aline, MD 09/19/2013, 9:57 AM

## 2013-09-20 LAB — BASIC METABOLIC PANEL
Anion gap: 15 (ref 5–15)
Anion gap: 18 — ABNORMAL HIGH (ref 5–15)
BUN: 10 mg/dL (ref 6–23)
BUN: 9 mg/dL (ref 6–23)
CALCIUM: 9 mg/dL (ref 8.4–10.5)
CALCIUM: 9.3 mg/dL (ref 8.4–10.5)
CHLORIDE: 102 meq/L (ref 96–112)
CO2: 23 mEq/L (ref 19–32)
CO2: 17 meq/L — AB (ref 19–32)
Chloride: 104 mEq/L (ref 96–112)
Creatinine, Ser: 1.03 mg/dL (ref 0.50–1.10)
Creatinine, Ser: 0.95 mg/dL (ref 0.50–1.10)
GFR calc Af Amer: 64 mL/min — ABNORMAL LOW (ref >90)
GFR calc Af Amer: 71 mL/min — ABNORMAL LOW (ref >90)
GFR calc non Af Amer: 61 mL/min — ABNORMAL LOW (ref >90)
GFR, EST NON AFRICAN AMERICAN: 55 mL/min — AB (ref >90)
GLUCOSE: 156 mg/dL — AB (ref 70–99)
GLUCOSE: 155 mg/dL — AB (ref 70–99)
Potassium: 3.1 mEq/L — ABNORMAL LOW (ref 3.7–5.3)
Potassium: 3.9 mEq/L (ref 3.7–5.3)
Sodium: 142 mEq/L (ref 137–147)
Sodium: 137 mEq/L (ref 137–147)

## 2013-09-20 LAB — GLUCOSE, CAPILLARY
GLUCOSE-CAPILLARY: 158 mg/dL — AB (ref 70–99)
GLUCOSE-CAPILLARY: 171 mg/dL — AB (ref 70–99)
GLUCOSE-CAPILLARY: 139 mg/dL — AB (ref 70–99)
Glucose-Capillary: 130 mg/dL — ABNORMAL HIGH (ref 70–99)

## 2013-09-20 MED ORDER — ATORVASTATIN CALCIUM 20 MG PO TABS
20.0000 mg | ORAL_TABLET | Freq: Every day | ORAL | Status: DC
  Administered 2013-09-20: 20 mg via ORAL
  Filled 2013-09-20: qty 1
  Filled 2013-09-20: qty 2
  Filled 2013-09-20: qty 1

## 2013-09-20 MED ORDER — FUROSEMIDE 20 MG PO TABS
20.0000 mg | ORAL_TABLET | Freq: Two times a day (BID) | ORAL | Status: DC
  Administered 2013-09-20 – 2013-09-21 (×3): 20 mg via ORAL
  Filled 2013-09-20 (×6): qty 1

## 2013-09-20 MED ORDER — ATENOLOL 50 MG PO TABS
50.0000 mg | ORAL_TABLET | Freq: Two times a day (BID) | ORAL | Status: DC
  Administered 2013-09-20 – 2013-09-21 (×3): 50 mg via ORAL
  Filled 2013-09-20 (×4): qty 1

## 2013-09-20 MED ORDER — FENOFIBRATE 54 MG PO TABS
54.0000 mg | ORAL_TABLET | Freq: Every day | ORAL | Status: DC
  Administered 2013-09-20 – 2013-09-21 (×2): 54 mg via ORAL
  Filled 2013-09-20 (×2): qty 1

## 2013-09-20 MED ORDER — POTASSIUM CHLORIDE 2 MEQ/ML IV SOLN
INTRAVENOUS | Status: DC
Start: 2013-09-20 — End: 2013-09-21
  Administered 2013-09-20 – 2013-09-21 (×2): via INTRAVENOUS
  Filled 2013-09-20 (×2): qty 1000

## 2013-09-20 NOTE — Progress Notes (Signed)
Pt ambulating around room without assistance and tolerating well.  Will continue to monitor. Syliva Overman

## 2013-09-20 NOTE — Progress Notes (Signed)
Subjective: [name] had several small bowel movements yesterday during the day and then one large bowel movement last night. She said her stools have been brown and mostly formed, but the large episode last night had loose stool in it. This morning, she has gone about three times in the past hour. She reports some diffuse 2/10 cramping abdominal pain. She has tolerated her full clear diet without nausea or emesis. She also denies any fevers, chills, night sweats, chest pain, or SOB. Night team was called to put in loratidine po order for allergies/congestion.  Vital signs in last 24 hours: Filed Vitals:   09/19/13 0500 09/19/13 1415 09/19/13 2138 09/20/13 0625  BP: 157/62 138/57 152/56 142/58  Pulse: 76 73 73 78  Temp: 98.4 F (36.9 C) 98.5 F (36.9 C) 98.7 F (37.1 C) 98.5 F (36.9 C)  TempSrc: Oral Oral Oral   Resp: 16 18 17 17   Height:      Weight:      SpO2: 96% 100% 96% 95%   Weight change:   Intake/Output Summary (Last 24 hours) at 09/20/13 0848 Last data filed at 09/20/13 0600  Gross per 24 hour  Intake   2660 ml  Output      0 ml  Net   2660 ml  Emesis/NG output: 6/30 1400 mL, 7/1 1250 mL, 7/2 950 mL  BP 142/58  Pulse 78  Temp(Src) 98.5 F (36.9 C) (Oral)  Resp 17  Ht 5' 4"  (1.626 m)  Wt 191 lb (86.637 kg)  BMI 32.77 kg/m2  SpO2 95%  General Appearance:    Alert, cooperative, no acute distress  HEENT:    Normocephalic, without obvious abnormality, atraumatic. Moist mucous membranes  Lungs:     Clear to auscultation bilaterally, respirations unlabored   Heart:    Regular rate and rhythm, S1 and S2 normal, no murmur, rub   or gallop  Abdomen:     Soft, very mild tenderness in the RUQ but otherwise non-tender, faint bowel sounds noted in all four quadrants. No masses, no organomegaly, several previous incision scars on the mid-lower abdomen  Extremities:   Extremities normal, atraumatic, no cyanosis or edema. 2+ radial pulses bilaterally.    Lab  Results: BMET    Component Value Date/Time   NA 142 09/20/2013 0344   NA 137 08/06/2013 1518   K 3.1* 09/20/2013 0344   K 4.1 08/06/2013 1518   CL 104 09/20/2013 0344   CO2 23 09/20/2013 0344   CO2 24 08/06/2013 1518   GLUCOSE 156* 09/20/2013 0344   GLUCOSE 283* 08/06/2013 1518   BUN 10 09/20/2013 0344   BUN 29.2* 08/06/2013 1518   CREATININE 1.03 09/20/2013 0344   CREATININE 1.8* 08/06/2013 1518   CREATININE 1.16* 12/11/2011 1125   CALCIUM 9.0 09/20/2013 0344   CALCIUM 9.6 08/06/2013 1518   GFRNONAA 55* 09/20/2013 0344   GFRNONAA 50* 12/11/2011 1125   GFRAA 64* 09/20/2013 0344   GFRAA 57* 12/11/2011 1125     Micro Results: No results found for this or any previous visit (from the past 240 hour(s)). Studies/Results: No results found. Medications: I have reviewed the patient's current medications. Scheduled Meds: . amLODipine  10 mg Oral Daily  . anastrozole  1 mg Oral Daily  . aspirin EC  81 mg Oral Daily  . clopidogrel  75 mg Oral Daily  . heparin  5,000 Units Subcutaneous 3 times per day  . insulin aspart  0-15 Units Subcutaneous TID WC  . lisinopril  10 mg Oral Daily   Continuous Infusions: . sodium chloride 0.45 % with kcl     PRN Meds:.ketorolac, loratadine, morphine injection, ondansetron (ZOFRAN) IV, phenol  Assessment/Plan:  1. Small bowel obstruction: SBO has resolved. Patient began having bowel movements yesterday. Cramping is likely due to urge for bowel movement and peristalsis. She has bowel sounds and no more tenderness on exam. Full clear diet tolerated well. IVF may be weaned back as she is taking in more po. -Appreciate surgery recs -Advance diet to full liquid -Ketorolac 15 mg IV q6hprn for pain -Decrease IVF to 50 cc/hr NS   2. Hypertension: Blood pressure still in the 130s-150s/50s-60s. Restarted lisinopril and increased amlodipine to home dose yesterday.  -resume home atenolol, furosemide -cont to monitor  3. Hypokalemia: Patient K of 3.1 this am. Was 3.4 on 7/3 and  went up to 3.9 yesterday after 40 mEq IV. Likely due to NG suction of gastric fluid and watery stool. Mg yesterday was 1.8 -KCl 30 mEq infusion with IVF -check at 6 pm -cont to monitor with Mg in am  4. CAD with stable angina: Resumed clopidogrel and ASA 81 when NG removed. Followed by Dr. Johnsie Cancel of [name] cardiology, currently no chest pain  -continue clopidogrel and ASA 81 -restart home statin  5. DM Type 2:  Capillary glucose checks mid 100s. Hold Lantus and meal coverage while diet restricted, last HgbA1c >10  -cover with SSI   6. Stage 1 Invasive ductal carcinoma (right breast): s/p lumpectomy, radiation therapy, managed with anastrozole currently   7. CKD Stage 3: at baseline creatinine (currently 1.2)   8. Anemia: stable at baseline Hgb (10.3)  9-. Congestion: Likely seasonal allergies as she stopped taking loratidine since admission until reordered last night. -cont home loratidine  DVT ppx: Hep SQ  CODE FULL   Dispo: Disposition is deferred at this time, awaiting improvement of current medical problems.  Anticipated discharge in approximately 2 day(s).   The patient does have a current PCP Bartholomew Crews, MD) and does need an [facility] hospital follow-up appointment after discharge.  The patient does not know have transportation limitations that hinder transportation to clinic appointments.  .Services Needed at time of discharge: Y = Yes, Blank = No PT:   OT:   RN:   Equipment:   Other:     LOS: 5 days   Kelby Aline, MD 09/20/2013, 8:48 AM

## 2013-09-20 NOTE — Progress Notes (Signed)
Patient ID: [name], female   DOB: 12-31-1945, 68 y.o.   MRN: [id number]  [facility] - Progress Note  HD# 6  Subjective: Patient tolerating clear liquid diet overnight.  Loose BM's.  Denies nausea or emesis.  Mild pain.  Objective: Vital signs in last 24 hours: Temp:  [98.5 F (36.9 C)-98.7 F (37.1 C)] 98.5 F (36.9 C) (07/05 0625) Pulse Rate:  [73-78] 78 (07/05 0625) Resp:  [17-18] 17 (07/05 0625) BP: (138-152)/(56-58) 142/58 mmHg (07/05 0625) SpO2:  [95 %-100 %] 95 % (07/05 0625) Last BM Date: 09/19/13  Intake/Output from previous day: 07/04 0701 - 07/05 0700 In: 2660 [P.O.:360; I.V.:2300] Out: -   Exam: HEENT - clear, not icteric Neck - soft Chest - clear bilaterally Cor - RRR, no murmur Abd - soft without distension; no tenderness; BS present Ext - no significant edema Neuro - grossly intact, no focal deficits  Lab Results:  No results found for this basename: WBC, HGB, HCT, PLT,  in the last 72 hours   Recent Labs  09/19/13 0410 09/20/13 0344  NA 144 142  K 3.9 3.1*  CL 106 104  CO2 25 23  GLUCOSE 191* 156*  BUN 12 10  CREATININE 1.22* 1.03  CALCIUM 9.3 9.0    Studies/Results: No results found.  Assessment / Plan: 1.  Resolving SBO  Advance to full liquid diet  Decrease IV rate  Encouraged ambulation  Medical service to address hypokalemia, DM, HTN  Will follow  Earnstine Regal, MD, [facility] Office: [phone]  09/20/2013

## 2013-09-21 LAB — BASIC METABOLIC PANEL
Anion gap: 14 (ref 5–15)
BUN: 9 mg/dL (ref 6–23)
CHLORIDE: 100 meq/L (ref 96–112)
CO2: 22 mEq/L (ref 19–32)
Calcium: 8.9 mg/dL (ref 8.4–10.5)
Creatinine, Ser: 1.01 mg/dL (ref 0.50–1.10)
GFR calc Af Amer: 66 mL/min — ABNORMAL LOW (ref >90)
GFR, EST NON AFRICAN AMERICAN: 57 mL/min — AB (ref >90)
Glucose, Bld: 148 mg/dL — ABNORMAL HIGH (ref 70–99)
POTASSIUM: 3.8 meq/L (ref 3.7–5.3)
SODIUM: 136 meq/L — AB (ref 137–147)

## 2013-09-21 LAB — GLUCOSE, CAPILLARY
Glucose-Capillary: 156 mg/dL — ABNORMAL HIGH (ref 70–99)
Glucose-Capillary: 170 mg/dL — ABNORMAL HIGH (ref 70–99)

## 2013-09-21 LAB — MAGNESIUM: MAGNESIUM: 1.4 mg/dL — AB (ref 1.5–2.5)

## 2013-09-21 NOTE — Progress Notes (Addendum)
Subjective: [name] is feeling much better. She continues to have several loose bowel movements and tolerated her full liquid diet well without nausea or emesis. She also denies abdominal pain, melena, hematochezia, fevers, chills, night sweats, chest pain , or shortness of breath.   Vital signs in last 24 hours: Filed Vitals:   09/20/13 0905 09/20/13 1830 09/20/13 2227 09/21/13 0612  BP: 162/64 148/53 165/64 150/61  Pulse: 68 72 62 71  Temp: 98.6 F (37 C) 99 F (37.2 C) 98.9 F (37.2 C) 99.2 F (37.3 C)  TempSrc:  Oral Oral Oral  Resp: 16 18 18 17   Height:      Weight:      SpO2: 100% 96% 99% 95%   Weight change:   Intake/Output Summary (Last 24 hours) at 09/21/13 0938 Last data filed at 09/21/13 5462  Gross per 24 hour  Intake   1020 ml  Output    300 ml  Net    720 ml  Emesis/NG output: 6/30 1400 mL, 7/1 1250 mL, 7/2 950 mL  BP 150/61  Pulse 71  Temp(Src) 99.2 F (37.3 C) (Oral)  Resp 17  Ht 5' 4"  (1.626 m)  Wt 191 lb (86.637 kg)  BMI 32.77 kg/m2  SpO2 95%  General Appearance:    Alert, cooperative, no acute distress  HEENT:    Normocephalic, without obvious abnormality, atraumatic. Moist mucous membranes  Lungs:     Clear to auscultation bilaterally, respirations unlabored   Heart:    Regular rate and rhythm, S1 and S2 normal, no murmur, rub   or gallop  Abdomen:     Soft, non-tender, faint bowel sounds noted in all four quadrants. No masses, no organomegaly, several previous incision scars on the mid-lower abdomen  Extremities:   Extremities normal, atraumatic, no cyanosis or edema. 2+ radial and DP pulses bilaterally.    Lab Results: BMET    Component Value Date/Time   NA 136* 09/21/2013 0500   NA 137 08/06/2013 1518   K 3.8 09/21/2013 0500   K 4.1 08/06/2013 1518   CL 100 09/21/2013 0500   CO2 22 09/21/2013 0500   CO2 24 08/06/2013 1518   GLUCOSE 148* 09/21/2013 0500   GLUCOSE 283* 08/06/2013 1518   BUN 9 09/21/2013 0500   BUN 29.2* 08/06/2013 1518   CREATININE 1.01 09/21/2013 0500   CREATININE 1.8* 08/06/2013 1518   CREATININE 1.16* 12/11/2011 1125   CALCIUM 8.9 09/21/2013 0500   CALCIUM 9.6 08/06/2013 1518   GFRNONAA 57* 09/21/2013 0500   GFRNONAA 50* 12/11/2011 1125   GFRAA 66* 09/21/2013 0500   GFRAA 57* 12/11/2011 1125     Micro Results: No results found for this or any previous visit (from the past 240 hour(s)). Studies/Results: No results found. Medications: I have reviewed the patient's current medications. Scheduled Meds: . amLODipine  10 mg Oral Daily  . anastrozole  1 mg Oral Daily  . aspirin EC  81 mg Oral Daily  . atenolol  50 mg Oral BID  . atorvastatin  20 mg Oral QHS  . clopidogrel  75 mg Oral Daily  . fenofibrate  54 mg Oral Daily  . furosemide  20 mg Oral BID  . heparin  5,000 Units Subcutaneous 3 times per day  . insulin aspart  0-15 Units Subcutaneous TID WC  . lisinopril  10 mg Oral Daily   Continuous Infusions:   PRN Meds:.ketorolac, loratadine, morphine injection, ondansetron (ZOFRAN) IV, phenol  Assessment/Plan:  1. Small bowel obstruction:  SBO has resolved. Patient continues to have bowel movements without complications. Cramping has resolved and was likely secondary to initiation of bowel movements.  Full liquid diet tolerated well. If she can tolerate solids, she may be discharged  -Appreciate surgery recs -Advance diet to solid carb and monitor tolerance -Ketorolac 15 mg IV q6hprn for pain -discontinue IVF   2. Hypertension: Blood pressure in the 140s-160s/50s-60s. On home amlodipine, lasix, lisinopril, atenolol  -cont to monitor  3. Hypokalemia: Patient no longer hypokalemic (3.8 this am) as she has received KCl infusion 30 mEq with IVF. K of 3.1 this am. Was 3.4 on 7/3 and went up to 3.9 yesterday after 40 mEq IV. Magnesium this am was 1.4 -d/c KCl 30 mEq infusion with IVF -will not supplement Mg as this will make her have diarrhea and she is resuming solid diet which may increase it -cont to  monitor  4. CAD with stable angina: Resumed clopidogrel and ASA 81 when NG removed. Followed by Dr. Johnsie Cancel of [facility] cardiology, currently no chest pain  -continue clopidogrel, ASA 81, home statin  5. DM Type 2:  Capillary glucose checks 100s. Hold Lantus and meal coverage while diet restricted, last HgbA1c >10  -cover with SSI  -close half home dose lantus on discharge with close follow-up  6. Stage 1 Invasive ductal carcinoma (right breast): s/p lumpectomy, radiation therapy, managed with anastrozole currently   7. AKI on CKD Stage 3: at baseline creatinine (currently 1.0)  -monitoring renal function during stay  8. Anemia: stable at baseline Hgb (10.3 on 7/1)  9-. Congestion: Likely seasonal allergies  -cont home loratidine  DVT ppx: Hep SQ  CODE FULL   Dispo: Disposition is deferred at this time, awaiting improvement of current medical problems.  Anticipated discharge in approximately 0-1 day(s).   The patient does have a current PCP Bartholomew Crews, MD) and does need an [facility] hospital follow-up appointment after discharge.  The patient does not have transportation limitations that hinder transportation to clinic appointments.  .Services Needed at time of discharge: Y = Yes, Blank = No PT:   OT:   RN:   Equipment:   Other:     LOS: 6 days   Kelby Aline, MD 09/21/2013, 9:38 AM Attending physician note: Patient examined together with resident physician Dr. Lottie Mussel. Status and management plan accurate as recorded above. She is making progress. Nasogastric tube is out. Now having watery bowel movements. Tolerating clear liquids. We will advance her diet.  Murriel Hopper, MD, [place]  Hematology-Oncology/Internal Medicine

## 2013-09-21 NOTE — Clinical Documentation Improvement (Signed)
  Patient with PSBO with abnormal labs noted on admission now resolved. Please address in Notes to illustrate severity of illness and risk of mortality. Thank you.  Possible Clinical Conditions?    - AKI                                - ARF on CKD stage III - Other Condition                  Supporting Information: - nausea and vomiting   - Cr:  6/30 1.30,  7/1 1.33,  7/3 1.17,  7/5 1.03,  7/5 0.95  - GFR:  6/30 48,  7/1 47,  7/3 55,  7/5 71 - 1/2 NS 50 ml/hr - monitoring renal function during stay  Thank You, Ezekiel Ina ,RN Clinical Documentation Specialist:  [employer]

## 2013-09-21 NOTE — Progress Notes (Signed)
Pt. Discharge to home.  Home discharge instructions given.  No question verbalized.

## 2013-09-21 NOTE — Discharge Summary (Signed)
Name: [name] MRN: [id number] DOB: 10/07/1945 68 y.o. PCP: Bartholomew Crews, MD  Date of Admission: 09/15/2013  2:52 AM Date of Discharge: 09/21/2013 Attending Physician: No att. providers found  Discharge Diagnosis:  Principal Problem:   SBO (small bowel obstruction) Active Problems:   Hypertension   CAD (coronary artery disease)   Hyperlipidemia   Type II or unspecified type diabetes mellitus with renal manifestations, uncontrolled   Breast cancer of lower-inner quadrant of right female breast  Discharge Medications:   Medication List         amLODipine 10 MG tablet  Commonly known as:  NORVASC  Take 10 mg by mouth daily.     anastrozole 1 MG tablet  Commonly known as:  ARIMIDEX  Take 1 mg by mouth daily.     aspirin EC 81 MG tablet  Take 81 mg by mouth daily.     atenolol 50 MG tablet  Commonly known as:  TENORMIN  Take 50 mg by mouth 2 (two) times daily.     atorvastatin 20 MG tablet  Commonly known as:  LIPITOR  Take 20 mg by mouth at bedtime.     clopidogrel 75 MG tablet  Commonly known as:  PLAVIX  Take 75 mg by mouth daily.     colesevelam 625 MG tablet  Commonly known as:  WELCHOL  Take 1,875 mg by mouth daily with breakfast.     desloratadine 5 MG tablet  Commonly known as:  CLARINEX  Take 5 mg by mouth daily as needed (allergies).     fenofibrate 48 MG tablet  Commonly known as:  TRICOR  Take 48 mg by mouth daily.     ferrous fumarate 325 (106 FE) MG Tabs tablet  Commonly known as:  HEMOCYTE - 106 mg FE  Take 1 tablet by mouth daily.     furosemide 20 MG tablet  Commonly known as:  LASIX  Take 20 mg by mouth 2 (two) times daily.     hydrOXYzine 50 MG capsule  Commonly known as:  VISTARIL  Take 50 mg by mouth at bedtime as needed for anxiety.     insulin glargine 100 UNIT/ML injection  Commonly known as:  LANTUS  Inject 36 Units into the skin at bedtime.     insulin lispro 100 UNIT/ML injection  Commonly known as:  HUMALOG    - Inject subcutaneously three times daily with breakfast, lunch and dinner.  - Take 3 units for meal coverage plus correction for your pre-meal blood sugar as instructed.     lisinopril 10 MG tablet  Commonly known as:  PRINIVIL,ZESTRIL  Take 10 mg by mouth daily.     loperamide 2 MG capsule  Commonly known as:  IMODIUM  Take 2 mg by mouth as needed for diarrhea or loose stools.     mometasone 50 MCG/ACT nasal spray  Commonly known as:  NASONEX  Place 2 sprays into the nose daily.     nitroGLYCERIN 0.4 MG SL tablet  Commonly known as:  NITROSTAT  Place 0.4 mg under the tongue every 5 (five) minutes as needed for chest pain.     potassium chloride 10 MEQ tablet  Commonly known as:  K-DUR  Take 20 mEq by mouth daily.     zolpidem 5 MG tablet  Commonly known as:  AMBIEN  Take 5 mg by mouth at bedtime as needed for sleep.        Disposition and follow-up:   [name]  was discharged from [facility] in Good condition.  At the hospital follow up visit please address:  1.  Tolerance of diet advancement  2.  Labs / imaging needed at time of follow-up: none  3.  Pending labs/ test needing follow-up: none  Follow-up Appointments: Follow-up Information   Follow up with Larey Dresser, MD On 10/01/2013.   Specialty:  Internal Medicine   Contact information:   [place] [address] [phone]      PCP Dr. Larey Dresser 10/01/13  Discharge Instructions: Discharge Instructions   Diet - low sodium heart healthy    Complete by:  As directed      Discharge instructions    Complete by:  As directed   Advance diet as tolerated     Increase activity slowly    Complete by:  As directed            Consultations: Treatment Team:  Md Ccs, MD  Procedures Performed:  Ct Abdomen Pelvis W Contrast  09/15/2013   CLINICAL DATA:  Abdominal pain  EXAM: CT ABDOMEN AND PELVIS WITH CONTRAST  TECHNIQUE: Multidetector CT imaging of  the abdomen and pelvis was performed using the standard protocol following bolus administration of intravenous contrast.  CONTRAST:  7m OMNIPAQUE IOHEXOL 300 MG/ML  SOLN  COMPARISON:  12/30/2008  FINDINGS: BODY WALL: There is skin thickening of the right breast. Low-density present within the right lumpectomy cavity. Based on colloid injection 2/15, the lumpectomy was recent and these are likely postoperative changes.  LOWER CHEST: Unremarkable.  ABDOMEN/PELVIS:  Liver: No focal abnormality.  Biliary: Cholecystectomy. This accounts for mild enlargement of the common bile duct, likely reservoir effect.  Pancreas: Main pancreatic duct is diffusely prominent, measuring 4 mm at the pancreatic head. Previous non contrast imaging did not resolved the pancreatic duct well, but suspect this is a chronic finding based on those comparison studies. There is no visible mass or stone at the ampulla. The pancreas is atrophic, but expected for age. No parenchymal calcifications.  Spleen: Unremarkable.  Adrenals: Unremarkable.  Kidneys and ureters: 22 mm interpolar right renal cyst. No hydronephrosis or nephrolithiasis.  Bladder: Unremarkable.  Reproductive: Unremarkable.  Bowel: There is abnormal small bowel dilatation and fluid filling leading up to an abrupt transition point in the left lower quadrant where there is distal decompression of bowel loops. At the transition point, the bowel is angulated suggesting adhesion. There is minimal mesenteric congestion. No evidence of bowel devascularization or perforation. Mottled lucency in the affected bowel loops is more consistent with fecalized contents the pneumatosis. Probable resection of the ileocecal valve. Appendix surgically absent.  Retroperitoneum: No mass or adenopathy.  Peritoneum: No ascites or pneumoperitoneum.  Vascular: No acute abnormality.  OSSEOUS: No acute abnormalities.  IMPRESSION: High-grade distal small bowel obstruction, likely from adhesions.No evidence of  bowel necrosis.   Electronically Signed   By: JJorje GuildM.D.   On: 09/15/2013 07:16    Dg Abd 2 Views  09/17/2013   CLINICAL DATA:  Recent small bowel obstruction, improving clinically  EXAM: ABDOMEN - 2 VIEW  COMPARISON:  Abdominal series of September 16, 2013  FINDINGS: There remain loops of mildly distended gas and fluid-filled small bowel in the mid and lower abdomen. There is contrast within the right colon. There is a moderate stool burden in the transverse and descending portions of the colon. There is gas in the rectum there surgical clips in the gallbladder fossa and more laterally in the right  upper quadrant. The esophagogastric tube's proximal port lies at or above the GE junction. Advancement by 10 cm is recommended  IMPRESSION: There has been slight interval decrease in the volume of small bowel gas and fluid which may indicate ongoing resolution of the small bowel obstruction. Advancement of the nasogastric tube by 10 cm is recommended.   Electronically Signed   By: David  [name]   On: 09/17/2013 07:56   Dg Abd 2 Views  09/16/2013   CLINICAL DATA:  Small bowel obstruction.  EXAM: ABDOMEN - 2 VIEW  COMPARISON:  CT scan and radiographs of September 15, 2013.  FINDINGS: Nasogastric tube tip is seen at the expected level of the gastroesophageal junction, with side hole probably above the gastroesophageal junction. Status post cholecystectomy. Mildly dilated small bowel loops with air-fluid levels are noted concerning for distal small bowel obstruction. Stool is noted in the colon which is nondilated.  IMPRESSION: Mildly dilated small bowel loops with air-fluid levels are noted concerning for distal small bowel obstruction. Nasogastric tube tip is at the expected position of thr gastroesophageal junction.   Electronically Signed   By: Sabino Dick M.D.   On: 09/16/2013 08:04   Dg Abd Acute W/chest  09/15/2013   CLINICAL DATA:  Lower abdominal pain.  EXAM: ACUTE ABDOMEN SERIES (ABDOMEN 2 VIEW & CHEST 1  VIEW)  COMPARISON:  Chest x-ray 01/03/2010  FINDINGS: There are scattered air-fluid levels within mildly dilated small bowel. Colonic gas and stool is present. No pneumoperitoneum. No suspicious intra-abdominal mass effect or calcification. There are cholecystectomy clips. There has been a right axillary dissection. The lungs are clear and well aerated. No effusion or pneumothorax. No cardiomegaly. Coronary stent noted.  IMPRESSION: Abnormal bowel gas pattern which could reflect ileus/enteritis or early small bowel obstruction.   Electronically Signed   By: Jorje Guild M.D.   On: 09/15/2013 04:28   Admission HPI: [name] is a 68 yo Female who presented to [facility] ED with complaints of dull non-radiating lower abdominal pain that begin ~1pm the day prior to admission. Denies nausea, vomiting, diarrhea, constipation or fever. Her history is significant for small bowel obstruction, lupus, GERD, hiatal hernia, chronic pain, CVA 04/2011, CKD Stage 3, DM Type 2, hypertension, hyperlipidemia, and breast cancer with recent mastectomy Feb 2015 and XRT May 2015. Imagining in ED revealed Abd XR with scattered air-fluid levels and dilated small bowel; CT indicative of high-grade small bowel obstruction. IMTS was consulted to admit giving multiple medical co-morbidities.  Hospital Course by problem list: Principal Problem:   SBO (small bowel obstruction) Active Problems:   Hypertension   CAD (coronary artery disease)   Hyperlipidemia   Type II or unspecified type diabetes mellitus with renal manifestations, uncontrolled   Breast cancer of lower-inner quadrant of right female breast   #Small bowel obstruction: [name] was admitted on 6/30 for SBO to the internal medicine residency program and followed by surgery throughout her hospitalization. She has a history >10 years ago of surgery for ovarian mass that led to SBO from adhesions that required operation. The decision was made to treat her conservatively  with medical management and she had an NG tube placed and was made NPO. She had flatus throughout her hospitalization. She was followed with serial abdominal films which showed some resolution of small bowel gas and fluid on 7/2. This coincided with symptom improvement as she noted decreased abdominal pain and <1L NG output. On 7/3, her NG tube was clamped and she was put  on clear liquid sips. Her NG tube was removed on 7/4 with a diet of full clear. She first had a bowel movement that day and since has had several loose bowel movements a day without evidence of hematachezia or melena. On 7/5, she was placed on full liquid. On  the day of discharge she had chicken and green beans without any issues and felt back to her baseline. All diet advancements were made without any issues.   #Hypertension: The patient's antihypertensives were held upon admission as she was NPO and vomiting but her BP was normotensive. Late night on 7/3 her BP was elevated and the home team gave her amlodipine 5 mg. This began her transition to home medications, with an increase in amlodipine to 10 mg daily, followed by the resumption of lisinopril, furosemide, and atenolol at home doses.   #CAD with stable angina: Plavix was held initially in the setting of possible surgery. It was resumed on 7/4 along with ASA 81 mg and lisinopril. Lipitor was restarted on 7/5 along with atenolol. At the time of discharge, her BP had been in the 130s-160s/50s-60s for the past day.  #Hypokalemia in setting of CKD stage III: The patient's presenting creatinine was 1.3 which steadily declined to 1.0 on discharge. She was nauseous and vomiting at the beginning of her treatment. She became hypokalemic on 7/3 with a potassium of 3.4 which went up to 3.9 after 40 mEq KCl. She then went down to a potassium of 3.1 on 7/5 which was increased to 3.8 on discharge after receiving 30 meQ KCl running in her maintenance fluids.  #DM Type 2: Her lHgbA1c was 10.9 on  08/20/13. Her lantus and meal coverage were held while she was NPO. She was given a sliding scale of insulin which resulted in capillary blood glucoses in the 100s-200s. She was resumed on her full insulin coverage on discharge assuming that she will eat her full diet but this should be addressed on follow-up.  #Stage 1 invasive ductal carcinoma (R breast): The patient is s/p lumpectomy and radiation therapy and was on anastrozole when admitted. This medication was held as she was throwing up and had an NG tube placed. It was resumed on 7/4.  #Anemia: Her hemoglobin was stable at her baseline of 10 during her stay.   Discharge Vitals:   BP 132/56  Pulse 64  Temp(Src) 99.2 F (37.3 C) (Oral)  Resp 16  Ht 5' 4" (1.626 m)  Wt 191 lb (86.637 kg)  BMI 32.77 kg/m2  SpO2 100%  Discharge Labs:  Results for orders placed during the hospital encounter of 09/15/13 (from the past 24 hour(s))  GLUCOSE, CAPILLARY     Status: Abnormal   Collection Time    09/20/13  4:58 PM      Result Value Ref Range   Glucose-Capillary 139 (*) 70 - 99 mg/dL   Comment 1 Notify RN    BASIC METABOLIC PANEL     Status: Abnormal   Collection Time    09/20/13  5:55 PM      Result Value Ref Range   Sodium 137  137 - 147 mEq/L   Potassium 3.9  3.7 - 5.3 mEq/L   Chloride 102  96 - 112 mEq/L   CO2 17 (*) 19 - 32 mEq/L   Glucose, Bld 155 (*) 70 - 99 mg/dL   BUN 9  6 - 23 mg/dL   Creatinine, Ser 0.95  0.50 - 1.10 mg/dL   Calcium 9.3  8.4 - 10.5 mg/dL   GFR calc non Af Amer 61 (*) >90 mL/min   GFR calc Af Amer 71 (*) >90 mL/min   Anion gap 18 (*) 5 - 15  GLUCOSE, CAPILLARY     Status: Abnormal   Collection Time    09/20/13 10:24 PM      Result Value Ref Range   Glucose-Capillary 130 (*) 70 - 99 mg/dL   Comment 1 Notify RN     Comment 2 Documented in Chart    BASIC METABOLIC PANEL     Status: Abnormal   Collection Time    09/21/13  5:00 AM      Result Value Ref Range   Sodium 136 (*) 137 - 147 mEq/L    Potassium 3.8  3.7 - 5.3 mEq/L   Chloride 100  96 - 112 mEq/L   CO2 22  19 - 32 mEq/L   Glucose, Bld 148 (*) 70 - 99 mg/dL   BUN 9  6 - 23 mg/dL   Creatinine, Ser 1.01  0.50 - 1.10 mg/dL   Calcium 8.9  8.4 - 10.5 mg/dL   GFR calc non Af Amer 57 (*) >90 mL/min   GFR calc Af Amer 66 (*) >90 mL/min   Anion gap 14  5 - 15  MAGNESIUM     Status: Abnormal   Collection Time    09/21/13  5:00 AM      Result Value Ref Range   Magnesium 1.4 (*) 1.5 - 2.5 mg/dL  GLUCOSE, CAPILLARY     Status: Abnormal   Collection Time    09/21/13  8:15 AM      Result Value Ref Range   Glucose-Capillary 156 (*) 70 - 99 mg/dL  GLUCOSE, CAPILLARY     Status: Abnormal   Collection Time    09/21/13 11:57 AM      Result Value Ref Range   Glucose-Capillary 170 (*) 70 - 99 mg/dL   CBC    Component Value Date/Time   WBC 9.4 09/16/2013 0056   WBC 6.4 08/06/2013 1518   RBC 4.00 09/16/2013 0056   RBC 3.50* 08/06/2013 1518   HGB 10.3* 09/16/2013 0056   HGB 8.6* 08/06/2013 1518   HCT 32.0* 09/16/2013 0056   HCT 27.3* 08/06/2013 1518   PLT 268 09/16/2013 0056   PLT 296 08/06/2013 1518   MCV 80.0 09/16/2013 0056   MCV 78.0* 08/06/2013 1518   MCH 25.8* 09/16/2013 0056   MCH 24.7* 08/06/2013 1518   MCHC 32.2 09/16/2013 0056   MCHC 31.7 08/06/2013 1518   RDW 15.2 09/16/2013 0056   RDW 14.6* 08/06/2013 1518   LYMPHSABS 1.2 09/15/2013 0308   LYMPHSABS 1.3 08/06/2013 1518   MONOABS 0.4 09/15/2013 0308   MONOABS 0.6 08/06/2013 1518   EOSABS 0.1 09/15/2013 0308   EOSABS 0.1 08/06/2013 1518   BASOSABS 0.0 09/15/2013 0308   BASOSABS 0.0 08/06/2013 1518    7/4 HgbA1c 10.9  Discharge Physical Exam:  General Appearance: Alert, cooperative, no acute distress  HEENT: Normocephalic, without obvious abnormality, atraumatic. Moist mucous membranes  Lungs: Clear to auscultation bilaterally, respirations unlabored  Heart: Regular rate and rhythm, S1 and S2 normal, no murmur, rub or gallop  Abdomen: Soft, non-tender, faint bowel sounds noted in all  four quadrants. No masses, no organomegaly, several previous incision scars on the mid-lower abdomen  Extremities: Extremities normal, atraumatic, no cyanosis or edema. 2+ radial and DP pulses bilaterally.    Signed: Kelby Aline, MD 09/21/2013,  4:26 PM  Attending physician note: I personally examined this patient on the day of discharge. Active problems, physical findings, assessment, and discharge plans, accurate as recorded above by resident physician Dr. Lottie Mussel. This is a 68 year old hypertensive, insulin-dependent diabetic, status post recent right lumpectomy for node-negative, estrogen receptor positive HER-2 negative, breast cancer who presented with signs and symptoms of bowel obstruction. She had an exploratory laparotomy in 2013 for x-ray findings suggesting a right lower quadrant pelvic mass. This turned out to be a benign ovarian mass. About one month following the surgery, she developed bowel obstruction secondary to adhesions and had a second surgical procedure to lyse the adhesions. She has had no abdominal problems until the time of the current admission. Initial regular x-ray showed air-fluid levels in the small bowel. This was confirmed by a CT scan which showed a high-grade, distal obstruction in the small bowel with an abrupt transition point in the left lower quadrant. Surgical consultation was obtained. She was put at bowel rest and parenteral hydration started. Conservative therapy was recommended. She slowly improved. She began to pass liquid stool on the day prior to discharge. Diet was slowly advanced and tolerated.  Murriel Hopper, MD, FACP  Hematology-Oncology/Internal Medicine    Services Ordered on Discharge: none Equipment Ordered on Discharge: none

## 2013-09-21 NOTE — Progress Notes (Signed)
[facility] Progress Note     Subjective: Pt doing well.  Tolerating fulls, getting HH at lunch.  Having loose stools after she eats.  Had multiple BM's and flatus.  Ambulating well.    Objective: Vital signs in last 24 hours: Temp:  [98.9 F (37.2 C)-99.2 F (37.3 C)] 99.2 F (37.3 C) (07/06 0612) Pulse Rate:  [62-72] 71 (07/06 0612) Resp:  [17-18] 17 (07/06 0612) BP: (148-165)/(53-64) 150/61 mmHg (07/06 0612) SpO2:  [95 %-99 %] 95 % (07/06 0612) Last BM Date: 09/20/13  Intake/Output from previous day: 07/05 0701 - 07/06 0700 In: 59 [P.O.:360; I.V.:420] Out: -  Intake/Output this shift: Total I/O In: 240 [P.O.:240] Out: 300 [Urine:300]  PE: Gen:  Alert, NAD, pleasant Abd: Soft, NT/ND, +BS, no HSM   Lab Results:  No results found for this basename: WBC, HGB, HCT, PLT,  in the last 72 hours BMET  Recent Labs  09/20/13 1755 09/21/13 0500  NA 137 136*  K 3.9 3.8  CL 102 100  CO2 17* 22  GLUCOSE 155* 148*  BUN 9 9  CREATININE 0.95 1.01  CALCIUM 9.3 8.9   PT/INR No results found for this basename: LABPROT, INR,  in the last 72 hours CMP     Component Value Date/Time   NA 136* 09/21/2013 0500   NA 137 08/06/2013 1518   K 3.8 09/21/2013 0500   K 4.1 08/06/2013 1518   CL 100 09/21/2013 0500   CO2 22 09/21/2013 0500   CO2 24 08/06/2013 1518   GLUCOSE 148* 09/21/2013 0500   GLUCOSE 283* 08/06/2013 1518   BUN 9 09/21/2013 0500   BUN 29.2* 08/06/2013 1518   CREATININE 1.01 09/21/2013 0500   CREATININE 1.8* 08/06/2013 1518   CREATININE 1.16* 12/11/2011 1125   CALCIUM 8.9 09/21/2013 0500   CALCIUM 9.6 08/06/2013 1518   PROT 8.7* 09/15/2013 0308   PROT 8.3 08/06/2013 1518   ALBUMIN 3.9 09/15/2013 0308   ALBUMIN 3.4* 08/06/2013 1518   AST 24 09/15/2013 0308   AST 14 08/06/2013 1518   ALT 21 09/15/2013 0308   ALT 12 08/06/2013 1518   ALKPHOS 80 09/15/2013 0308   ALKPHOS 91 08/06/2013 1518   BILITOT 0.3 09/15/2013 0308   BILITOT 0.26 08/06/2013 1518   GFRNONAA 57* 09/21/2013  0500   GFRNONAA 50* 12/11/2011 1125   GFRAA 66* 09/21/2013 0500   GFRAA 57* 12/11/2011 1125   Lipase     Component Value Date/Time   LIPASE 16 09/15/2013 0308       Studies/Results: No results found.  Anti-infectives: Anti-infectives   None       Assessment/Plan Resolving SBO  S/p multiple abdominal surgeries - bowel resection, cholecystectomy, lap BSO, hemicolectomy, hysterectomy  Plan: Advance to [facility] diet - per primary already ordered.  If she tolerates this she could go home today or tomorrow per primary service No surgical follow up needed Encouraged ambulation  Will s/o, call with questions/concerns      LOS: 6 days    DORT, Latara Micheli 09/21/2013, 10:20 AM Pager: [phone]

## 2013-09-21 NOTE — Progress Notes (Signed)
SBO resolved. No intervention needed at this time. Discharge per primary team.  Imogene Burn. Georgette Dover, MD, [facility]  General/ Trauma Surgery  09/21/2013 2:44 PM

## 2013-09-25 ENCOUNTER — Telehealth: Payer: Self-pay | Admitting: Dietician

## 2013-09-25 NOTE — Telephone Encounter (Signed)
Calling to assist with transition of care from hospital to home.  Discharge date:09-21-13 Call date: 09-25-13 Hospital follow up appointment date: 10-01-13 at 10:45 AM with Dr. Lynnae January  Left message with information about her hospital follow up appointment and to call if needed.

## 2013-10-01 ENCOUNTER — Ambulatory Visit (INDEPENDENT_AMBULATORY_CARE_PROVIDER_SITE_OTHER): Payer: Medicare Other | Admitting: Internal Medicine

## 2013-10-01 ENCOUNTER — Encounter: Payer: Self-pay | Admitting: Internal Medicine

## 2013-10-01 VITALS — BP 123/60 | HR 76 | Temp 98.8°F | Wt 191.7 lb

## 2013-10-01 DIAGNOSIS — I129 Hypertensive chronic kidney disease with stage 1 through stage 4 chronic kidney disease, or unspecified chronic kidney disease: Secondary | ICD-10-CM

## 2013-10-01 DIAGNOSIS — N2889 Other specified disorders of kidney and ureter: Secondary | ICD-10-CM

## 2013-10-01 DIAGNOSIS — I25119 Atherosclerotic heart disease of native coronary artery with unspecified angina pectoris: Secondary | ICD-10-CM

## 2013-10-01 DIAGNOSIS — Z0000 Encounter for general adult medical examination without abnormal findings: Secondary | ICD-10-CM

## 2013-10-01 DIAGNOSIS — C50319 Malignant neoplasm of lower-inner quadrant of unspecified female breast: Secondary | ICD-10-CM

## 2013-10-01 DIAGNOSIS — N183 Chronic kidney disease, stage 3 unspecified: Secondary | ICD-10-CM

## 2013-10-01 DIAGNOSIS — Z23 Encounter for immunization: Secondary | ICD-10-CM

## 2013-10-01 DIAGNOSIS — C50311 Malignant neoplasm of lower-inner quadrant of right female breast: Secondary | ICD-10-CM

## 2013-10-01 DIAGNOSIS — E1165 Type 2 diabetes mellitus with hyperglycemia: Principal | ICD-10-CM

## 2013-10-01 DIAGNOSIS — I151 Hypertension secondary to other renal disorders: Secondary | ICD-10-CM

## 2013-10-01 DIAGNOSIS — I2510 Atherosclerotic heart disease of native coronary artery without angina pectoris: Secondary | ICD-10-CM

## 2013-10-01 DIAGNOSIS — E1129 Type 2 diabetes mellitus with other diabetic kidney complication: Secondary | ICD-10-CM

## 2013-10-01 MED ORDER — INSULIN GLARGINE 100 UNIT/ML ~~LOC~~ SOLN: 36.0000 [IU] | Freq: Every day | SUBCUTANEOUS | Status: DC

## 2013-10-01 MED ORDER — FLUCONAZOLE 100 MG PO TABS: 100.0000 mg | ORAL_TABLET | Freq: Every day | ORAL | Status: DC

## 2013-10-01 MED ORDER — POTASSIUM CHLORIDE ER 10 MEQ PO TBCR: 20.0000 meq | EXTENDED_RELEASE_TABLET | Freq: Every day | ORAL | Status: DC

## 2013-10-01 NOTE — Assessment & Plan Note (Signed)
Her A1C in June was 10.9, deteriorated from 6.9. She takes 36 Lantus and Humalog 3 TID with correction scale. The correction scale was started in June and her CBG's decreased from 500's to 200-300's now. She understands the correction scale, checks her CBG 3 times daily, and denies lows. She had a recent illness and week long hospitalization which could have contributed to the hyperglycemia. She is going to cont to check her CBG's and I have asked [name] to call her in 2 weeks to get her CBG's.

## 2013-10-01 NOTE — Assessment & Plan Note (Signed)
She cont to see the cancer center and is on Arimidex without side effects.

## 2013-10-01 NOTE — Assessment & Plan Note (Signed)
She remains on her ASA and plavix, BB, and statin. She has NTG SL PRN but is pain free.

## 2013-10-01 NOTE — Progress Notes (Signed)
   Subjective:    Patient ID: [name], female    DOB: June 03, 1945, 68 y.o.   MRN: [id number]  HPI  Please see the A&P for the status of the pt's chronic medical problems.  Review of Systems  Constitutional: Negative for activity change, appetite change and fatigue.  HENT: Positive for mouth sores and trouble swallowing. Negative for dental problem, drooling and sore throat.   Respiratory: Negative for shortness of breath.   Cardiovascular: Negative for chest pain.  Gastrointestinal: Positive for diarrhea. Negative for abdominal pain.  Skin: Negative for rash and wound.  Neurological: Negative for headaches.  Psychiatric/Behavioral: Negative for sleep disturbance.       Objective:   Physical Exam  Constitutional: She is oriented to person, place, and time. She appears well-developed and well-nourished. No distress.  HENT:  Head: Normocephalic and atraumatic.  Right Ear: External ear normal.  Left Ear: External ear normal.  Nose: Nose normal.  Angular cheilitis R corner of mouth. No thrush.   Eyes: Conjunctivae and EOM are normal.  Cardiovascular: Normal rate, regular rhythm and normal heart sounds.   Pulmonary/Chest: Effort normal and breath sounds normal. No respiratory distress.  Neurological: She is alert and oriented to person, place, and time.  Skin: Skin is warm and dry. She is not diaphoretic.  Psychiatric: She has a normal mood and affect. Her behavior is normal. Judgment and thought content normal.          Assessment & Plan:

## 2013-10-01 NOTE — Assessment & Plan Note (Signed)
Her BP is well controlled on Lisinopril 10, lasix 20 BID, Atenolol 50 BID, and norvac 10. Cont current meds.

## 2013-10-01 NOTE — Patient Instructions (Addendum)
1. [name] will call you in about 2 weeks to check your sugars 2. Continue to use the correction insulin 3. You got the new pneumonia vaccine (prevnar) today 4. Try Diflucan one pill once a day to help your mouth. 5. Keep your lips dry and use Vaseline as needed.  6. I sent your insulin and potassium refills to the drug store. 7. See me in September 8. If the diarrhea continues, please call me

## 2013-10-02 NOTE — Assessment & Plan Note (Signed)
Lab Results  Component Value Date   CREATININE 1.01 09/21/2013   Her Creatinine remains stable.

## 2013-10-02 NOTE — Assessment & Plan Note (Signed)
She got Prevnar today and will need repeat pneumvax in 6-12 months  She c/o sore R mouth angle. Had crusting but she cleaned it off. Present 1.5 weeks. Tried Vaseline and lip balm without improvement. B 12 nl. No sores in mouth. Still with dysphagia from NG suction. Has dentures but fit well. No dry mouth or excessive saliva. DX : Angular cheilitis. PLAN: since tried and failed symptomatic tx and cannot find reversible etiology, will try Diflucan since they can get superimposed infection with candida.

## 2013-10-13 ENCOUNTER — Telehealth: Payer: Self-pay | Admitting: Dietician

## 2013-10-13 NOTE — Telephone Encounter (Signed)
Called patient about her blood sugars  " Doing better, not as high as they were"  Says they range from 130-250, no lows.  Lantus:  36 units daily Meal coverage+Sliding scale: 3-5 units twice a day- eats 2 meals then a snack at night at which she does not take Novolog. She has no other concerns at this time.

## 2013-10-16 ENCOUNTER — Encounter: Payer: Self-pay | Admitting: Internal Medicine

## 2013-10-18 ENCOUNTER — Other Ambulatory Visit: Payer: Self-pay | Admitting: Internal Medicine

## 2013-10-19 ENCOUNTER — Encounter: Payer: Self-pay | Admitting: *Deleted

## 2013-10-19 NOTE — Telephone Encounter (Signed)
Pt's daughter is a Marine scientist and gives pt injection on schedule

## 2013-11-03 ENCOUNTER — Other Ambulatory Visit: Payer: Self-pay | Admitting: Oncology

## 2013-11-03 DIAGNOSIS — C50311 Malignant neoplasm of lower-inner quadrant of right female breast: Secondary | ICD-10-CM

## 2013-11-04 ENCOUNTER — Other Ambulatory Visit (HOSPITAL_BASED_OUTPATIENT_CLINIC_OR_DEPARTMENT_OTHER): Payer: Medicare Other

## 2013-11-04 ENCOUNTER — Ambulatory Visit (HOSPITAL_BASED_OUTPATIENT_CLINIC_OR_DEPARTMENT_OTHER): Payer: Medicare Other | Admitting: Oncology

## 2013-11-04 ENCOUNTER — Telehealth: Payer: Self-pay | Admitting: Oncology

## 2013-11-04 ENCOUNTER — Encounter: Payer: Self-pay | Admitting: Oncology

## 2013-11-04 VITALS — BP 130/55 | HR 71 | Temp 98.6°F | Resp 18 | Ht 64.0 in | Wt 190.9 lb

## 2013-11-04 DIAGNOSIS — N189 Chronic kidney disease, unspecified: Secondary | ICD-10-CM

## 2013-11-04 DIAGNOSIS — D649 Anemia, unspecified: Secondary | ICD-10-CM

## 2013-11-04 DIAGNOSIS — C50311 Malignant neoplasm of lower-inner quadrant of right female breast: Secondary | ICD-10-CM

## 2013-11-04 DIAGNOSIS — C50319 Malignant neoplasm of lower-inner quadrant of unspecified female breast: Secondary | ICD-10-CM

## 2013-11-04 LAB — CBC WITH DIFFERENTIAL/PLATELET
BASO%: 0.4 % (ref 0.0–2.0)
Basophils Absolute: 0 10*3/uL (ref 0.0–0.1)
EOS ABS: 0.1 10*3/uL (ref 0.0–0.5)
EOS%: 0.9 % (ref 0.0–7.0)
HEMATOCRIT: 26.5 % — AB (ref 34.8–46.6)
HGB: 8.6 g/dL — ABNORMAL LOW (ref 11.6–15.9)
LYMPH%: 12.7 % — AB (ref 14.0–49.7)
MCH: 25.6 pg (ref 25.1–34.0)
MCHC: 32.3 g/dL (ref 31.5–36.0)
MCV: 79.2 fL — AB (ref 79.5–101.0)
MONO#: 0.4 10*3/uL (ref 0.1–0.9)
MONO%: 4.3 % (ref 0.0–14.0)
NEUT%: 81.7 % — ABNORMAL HIGH (ref 38.4–76.8)
NEUTROS ABS: 7.7 10*3/uL — AB (ref 1.5–6.5)
PLATELETS: 331 10*3/uL (ref 145–400)
RBC: 3.35 10*6/uL — AB (ref 3.70–5.45)
RDW: 16.2 % — ABNORMAL HIGH (ref 11.2–14.5)
WBC: 9.5 10*3/uL (ref 3.9–10.3)
lymph#: 1.2 10*3/uL (ref 0.9–3.3)

## 2013-11-04 LAB — COMPREHENSIVE METABOLIC PANEL (CC13)
ALK PHOS: 81 U/L (ref 40–150)
ALT: 13 U/L (ref 0–55)
ANION GAP: 10 meq/L (ref 3–11)
AST: 16 U/L (ref 5–34)
Albumin: 3.1 g/dL — ABNORMAL LOW (ref 3.5–5.0)
BUN: 39.9 mg/dL — ABNORMAL HIGH (ref 7.0–26.0)
CO2: 24 meq/L (ref 22–29)
CREATININE: 1.8 mg/dL — AB (ref 0.6–1.1)
Calcium: 9.4 mg/dL (ref 8.4–10.4)
Chloride: 105 mEq/L (ref 98–109)
GLUCOSE: 139 mg/dL (ref 70–140)
Potassium: 4.6 mEq/L (ref 3.5–5.1)
SODIUM: 139 meq/L (ref 136–145)
TOTAL PROTEIN: 8.2 g/dL (ref 6.4–8.3)
Total Bilirubin: <0.2 mg/dL (ref 0.20–1.20)

## 2013-11-04 NOTE — Progress Notes (Signed)
OFFICE PROGRESS NOTE   11/04/2013   Physicians:Khan, Adele Schilder, Benjamine Mola; Magazine, Herbie [place]; [place]; [place], [place] [employer], C     INTERVAL HISTORY:   Patient is seen, alone for visit, in scheduled follow up of stage 1 invasive ductal carcinoma of right breast, on Arimidex since 08-06-2013 which she is tolerating well. .  She had bone density scan at [facility] 08-26-13, with normal bone density in femur and forearm, but mention of sclerotic changes in bones of uncertain significance. Radiation skin reaction has continued to improve, no longer uncomfortable.  She was admitted to [facility] 6-30 thru 09-21-13 with SBO which resolved with conservative measures, thought related to multiple surgical procedures and adhesions. Evaluation included CT AP with contrast 09-15-13, unremarkable spleen, no adenopathy, bones "no acute abnormalities" as well as the high grade distal bowel obstruction.  She has had no bleeding. She is not SOB with limited activity, but does not tolerate climbing stairs or walking up inclines. She denies chest pain or other new or different bone or joint pain. She has no LE swelling. She has had no further abdominal pain, no vomiting, bowels moving normally, tolerating regular diet.  Next appointment with Dr Lynnae January is in Oct.   ONCOLOGIC HISTORY #1Screening mammogram on 02/06/2013.possibly showed a mass in the right breast; 02/27/2013 diagnostic mammogram and ultrasound showed 6 x 5 mm mass at 5:00 position. Lymph nodes were not enlarged. She did not have an MRI. She underwent a biopsy on 03/16/2013 this revealed invasive ductal carcinoma, low grade. Tumor was ER positive PR positive HER-2/neu negative with a proliferation marker Ki-67 of 20%.  #2 s/p lumpectomy with sentinel lymph node biopsy on 05/12/13 with final pathology revealing:  Grade: II of III  Tumor size (gross measurement: 0.9 cm  Lymph nodes: 0/6  Breast prognostic profile:  Estrogen receptor: Not repeated,  previous study demonstrated 100% positivity  Progesterone receptor: Not repeated, previous study demonstrated 12% positivity  Her 2 neu: Repeated, previous study demonstrated no amplification (1.79)  #3 Oncotype Dx testing score: Her Oncotype score was 19 giving her a 12% risk of distant recurrence with tamoxifen only for 5 years. She does not want chemotherapy.  #4.Radiation given 06-02-13 thru 07-16-13 4500 Gy to right breast with boost 1600 Gy. #5 began Arimidex 08-06-2013  Review of systems as above, also: No significant hot flashes. No fever or symptoms of infection. No changes left breast. Remainder of 10 point Review of Systems negative.  Objective:  Vital signs in last 24 hours:  BP 130/55  Pulse 71  Temp(Src) 98.6 F (37 C) (Oral)  Resp 18  Ht _0  (1.626 m)  Wt 190 lb 14.4 oz (86.592 kg)  BMI 32.75 kg/m2 Weight is down 2 lbs. Alert, oriented and appropriate. Ambulatory without assistance.   HEENT:PERRL, sclerae not icteric. Oral mucosa moist without lesions, posterior pharynx clear.  Neck supple. No JVD.  Lymphatics:no cervical,suraclavicular, axillary adenopathy Resp: clear to auscultation bilaterally and normal percussion bilaterally. Not SOB with activity in exam room Cardio: regular rate and rhythm. No gallop. GI: soft, nontender, not distended, no mass or organomegaly. Normally active bowel sounds. Surgical incisions well healed. Musculoskeletal/ Extremities: without pitting edema, cords, tenderness Neuro:nonfocal Skin without rash, ecchymosis, petechiae Breasts: Right lumpectomy scar well healed, otherwise bilaterally without dominant mass, skin or nipple findings. Axillae benign  Lab Results:  Results for orders placed in visit on 11/04/13  CBC WITH DIFFERENTIAL      Result Value Ref Range   WBC 9.5  3.9 -  10.3 10e3/uL   NEUT# 7.7 (*) 1.5 - 6.5 10e3/uL   HGB 8.6 (*) 11.6 - 15.9 g/dL   HCT 26.5 (*) 34.8 - 46.6 %   Platelets 331  145 - 400 10e3/uL   MCV  79.2 (*) 79.5 - 101.0 fL   MCH 25.6  25.1 - 34.0 pg   MCHC 32.3  31.5 - 36.0 g/dL   RBC 3.35 (*) 3.70 - 5.45 10e6/uL   RDW 16.2 (*) 11.2 - 14.5 %   lymph# 1.2  0.9 - 3.3 10e3/uL   MONO# 0.4  0.1 - 0.9 10e3/uL   Eosinophils Absolute 0.1  0.0 - 0.5 10e3/uL   Basophils Absolute 0.0  0.0 - 0.1 10e3/uL   NEUT% 81.7 (*) 38.4 - 76.8 %   LYMPH% 12.7 (*) 14.0 - 49.7 %   MONO% 4.3  0.0 - 14.0 %   EOS% 0.9  0.0 - 7.0 %   BASO% 0.4  0.0 - 2.0 %  COMPREHENSIVE METABOLIC PANEL (QM25)      Result Value Ref Range   Sodium 139  136 - 145 mEq/L   Potassium 4.6  3.5 - 5.1 mEq/L   Chloride 105  98 - 109 mEq/L   CO2 24  22 - 29 mEq/L   Glucose 139  70 - 140 mg/dl   BUN 39.9 (*) 7.0 - 26.0 mg/dL   Creatinine 1.8 (*) 0.6 - 1.1 mg/dL   Total Bilirubin <0.20  0.20 - 1.20 mg/dL   Alkaline Phosphatase 81  40 - 150 U/L   AST 16  5 - 34 U/L   ALT 13  0 - 55 U/L   Total Protein 8.2  6.4 - 8.3 g/dL   Albumin 3.1 (*) 3.5 - 5.0 g/dL   Calcium 9.4  8.4 - 10.4 mg/dL   Anion Gap 10  3 - 11 mEq/L  Hemoglobin had been same at 8.6 when I met her in 07-2013, tho was higher during hospitalization without transfusion, possibly with some dehydration.    iron studies 07-2013 serum iron 90, %sat 21 and ferritin 13 B12 06-2012 >2000 UA 2012 negative blood  Colonoscopy 06-2008 Dr Carlean Purl Never transfused.  Studies/Results: Due mammograms at [facility] in Nov, ordered.  DUAL X-RAY ABSORPTIOMETRY (DXA) FOR BONE MINERAL DENSITY  FINDINGS:  LEFT FEMUR TOTAL  Bone Mineral Density (BMD): 1.236 g/cm2  Young Adult T-Score: 2.4  Z-Score: 2.3  LEFT FOREARM (1/3 RADIUS)  Bone Mineral Density (BMD): 0.790  Young Adult T Score: 1.6  Z Score: 3.3  ASSESSMENT: Patient's diagnostic category is NORMAL by WHO Criteria.  FRACTURE RISK: Not increased  FRAX: World Health Organization FRAX assessment of absolute fracture  risk is not calculated for this patient because the patient has  normal bone density.  COMPARISON: None.   Note: The left forearm was substituted for the lumbar spine due to  very high bone density throughout the lumbar spine, especially the  L2 through L4 levels. The vertebrae appear diffusely sclerotic on  the scout image. This is concerning for diffuse metastatic bony  disease or a metabolic abnormality. Lumbar spine radiographs and/or  radionuclide bone scan may provide useful additional information.     CT ABDOMEN AND PELVIS WITH CONTRAST 09-15-13  COMPARISON: 12/30/2008  FINDINGS:  BODY WALL: There is skin thickening of the right breast. Low-density  present within the right lumpectomy cavity. Based on colloid  injection 2/15, the lumpectomy was recent and these are likely  postoperative changes.  LOWER CHEST: Unremarkable.  ABDOMEN/PELVIS:  Liver: No focal abnormality.  Biliary: Cholecystectomy. This accounts for mild enlargement of the  common bile duct, likely reservoir effect.  Pancreas: Main pancreatic duct is diffusely prominent, measuring 4  mm at the pancreatic head. Previous non contrast imaging did not  resolved the pancreatic duct well, but suspect this is a chronic  finding based on those comparison studies. There is no visible mass  or stone at the ampulla. The pancreas is atrophic, but expected for  age. No parenchymal calcifications.  Spleen: Unremarkable.  Adrenals: Unremarkable.  Kidneys and ureters: 22 mm interpolar right renal cyst. No  hydronephrosis or nephrolithiasis.  Bladder: Unremarkable.  Reproductive: Unremarkable.  Bowel: There is abnormal small bowel dilatation and fluid filling  leading up to an abrupt transition point in the left lower quadrant  where there is distal decompression of bowel loops. At the  transition point, the bowel is angulated suggesting adhesion. There  is minimal mesenteric congestion. No evidence of bowel  devascularization or perforation. Mottled lucency in the affected  bowel loops is more consistent with fecalized  contents the  pneumatosis. Probable resection of the ileocecal valve. Appendix  surgically absent.  Retroperitoneum: No mass or adenopathy.  Peritoneum: No ascites or pneumoperitoneum.  Vascular: No acute abnormality.  OSSEOUS: No acute abnormalities.  IMPRESSION:  High-grade distal small bowel obstruction, likely from adhesions.No  evidence of bowel necrosis.     Medications: I have reviewed the patient's current medications. She is on ferrous fumarate daily.  DISCUSSION: She is pleased with how she is tolerating Arimidex thus far and with normal bone density scan. We have discussed anemia, Hgb same now as in 07-2013, does seem symptomatic with exertion. This may well be primarily from renal insufficiency; will check erythropoietin level with next labs.   Assessment/Plan:  1. T1N0 invasive ductal carcinoma of right breast: post lumpectomy and 6 sentinel nodes 04-22-2013, ER and PR +, HER 2 negative, Ki67 of 27%. Post radiation and on arimidex since 07-2013. For mammograms at [facility] in Nov.  I will see her in ~ 4 months. 2.anemia: likely related to CKD and chronic disease. Needs erythropoietin level at least at my next visit if not done by PCP prior 3.diabetes on insulin  4.chronic renal insufficiency  5. CAD, HTN: followed by Dr Johnsie Cancel  6.post right colectomy (or ileocolectomy). Recent SBO thought from adhesions, resolved with bowel rest. 7.chronic anemia: likely multifactorial, on oral iron and B12. Renal insufficiency likely contributing.   All questions answered and she is in agreement with plan. Time spent 25 min including >50% counseling and coordination of care    LIVESAY,LENNIS P, MD   11/04/2013, 3:32 PM

## 2013-11-04 NOTE — Telephone Encounter (Signed)
, °

## 2013-11-06 ENCOUNTER — Other Ambulatory Visit: Payer: Self-pay | Admitting: Internal Medicine

## 2013-11-06 DIAGNOSIS — N183 Chronic kidney disease, stage 3 unspecified: Secondary | ICD-10-CM

## 2013-11-06 NOTE — Progress Notes (Signed)
Talked with pt and will come 11/09/13 between 3 and 4:30PM. No restrictions on meals or meds.

## 2013-11-06 NOTE — Progress Notes (Signed)
Onc ordered BMP 19th and Cr was sig increased. I do not see a note or plan to address it. Would you pls ask her to come next week for BMP? Order in [employer]

## 2013-11-09 ENCOUNTER — Other Ambulatory Visit (INDEPENDENT_AMBULATORY_CARE_PROVIDER_SITE_OTHER): Payer: Medicare Other

## 2013-11-09 DIAGNOSIS — N183 Chronic kidney disease, stage 3 unspecified: Secondary | ICD-10-CM

## 2013-11-09 LAB — BASIC METABOLIC PANEL WITH GFR
BUN: 31 mg/dL — ABNORMAL HIGH (ref 6–23)
CALCIUM: 9.4 mg/dL (ref 8.4–10.5)
CO2: 24 mEq/L (ref 19–32)
Chloride: 107 mEq/L (ref 96–112)
Creat: 1.39 mg/dL — ABNORMAL HIGH (ref 0.50–1.10)
GFR, Est African American: 46 mL/min — ABNORMAL LOW
GFR, Est Non African American: 40 mL/min — ABNORMAL LOW
Glucose, Bld: 147 mg/dL — ABNORMAL HIGH (ref 70–99)
POTASSIUM: 4.3 meq/L (ref 3.5–5.3)
SODIUM: 140 meq/L (ref 135–145)

## 2013-11-10 NOTE — Progress Notes (Signed)
Patient informed. 

## 2013-11-16 ENCOUNTER — Telehealth: Payer: Self-pay | Admitting: *Deleted

## 2013-11-16 NOTE — Progress Notes (Signed)
Pt aware.Despina Hidden Cassady8/31/20158:59 AM

## 2013-11-16 NOTE — Telephone Encounter (Signed)
Pt walked into office with clearance form for 4 teeth to be pulled. Her dental appt is today.  She has taken Plavix today. Pt made aware I will send request to Dr. Johnsie Cancel to complete when back in office on Wednesday.

## 2013-11-17 NOTE — Telephone Encounter (Signed)
Ok to hold plavix 3-4 days before teeth pull

## 2013-11-18 NOTE — Telephone Encounter (Signed)
Completed form faxed to [place]. Pt aware

## 2013-12-10 ENCOUNTER — Other Ambulatory Visit (INDEPENDENT_AMBULATORY_CARE_PROVIDER_SITE_OTHER): Payer: Self-pay | Admitting: General Surgery

## 2013-12-10 DIAGNOSIS — C50911 Malignant neoplasm of unspecified site of right female breast: Secondary | ICD-10-CM

## 2013-12-15 ENCOUNTER — Ambulatory Visit: Payer: Medicare Other | Attending: General Surgery | Admitting: Physical Therapy

## 2013-12-15 DIAGNOSIS — M24519 Contracture, unspecified shoulder: Secondary | ICD-10-CM | POA: Diagnosis not present

## 2013-12-15 DIAGNOSIS — IMO0001 Reserved for inherently not codable concepts without codable children: Secondary | ICD-10-CM | POA: Diagnosis present

## 2013-12-15 DIAGNOSIS — M25519 Pain in unspecified shoulder: Secondary | ICD-10-CM | POA: Insufficient documentation

## 2013-12-17 ENCOUNTER — Ambulatory Visit (INDEPENDENT_AMBULATORY_CARE_PROVIDER_SITE_OTHER): Payer: Medicare Other | Admitting: Internal Medicine

## 2013-12-17 VITALS — BP 135/58 | HR 66 | Temp 97.9°F | Wt 189.9 lb

## 2013-12-17 DIAGNOSIS — Z0000 Encounter for general adult medical examination without abnormal findings: Secondary | ICD-10-CM

## 2013-12-17 DIAGNOSIS — E1129 Type 2 diabetes mellitus with other diabetic kidney complication: Secondary | ICD-10-CM

## 2013-12-17 DIAGNOSIS — N183 Chronic kidney disease, stage 3 unspecified: Secondary | ICD-10-CM

## 2013-12-17 DIAGNOSIS — I10 Essential (primary) hypertension: Secondary | ICD-10-CM

## 2013-12-17 DIAGNOSIS — E1165 Type 2 diabetes mellitus with hyperglycemia: Secondary | ICD-10-CM

## 2013-12-17 DIAGNOSIS — I25119 Atherosclerotic heart disease of native coronary artery with unspecified angina pectoris: Secondary | ICD-10-CM

## 2013-12-17 DIAGNOSIS — M25511 Pain in right shoulder: Secondary | ICD-10-CM

## 2013-12-17 DIAGNOSIS — G8929 Other chronic pain: Secondary | ICD-10-CM

## 2013-12-17 DIAGNOSIS — IMO0002 Reserved for concepts with insufficient information to code with codable children: Secondary | ICD-10-CM

## 2013-12-17 DIAGNOSIS — D649 Anemia, unspecified: Secondary | ICD-10-CM

## 2013-12-17 DIAGNOSIS — Z23 Encounter for immunization: Secondary | ICD-10-CM

## 2013-12-17 LAB — POCT GLYCOSYLATED HEMOGLOBIN (HGB A1C): HEMOGLOBIN A1C: 7.6

## 2013-12-17 LAB — GLUCOSE, CAPILLARY: Glucose-Capillary: 145 mg/dL — ABNORMAL HIGH (ref 70–99)

## 2013-12-17 MED ORDER — CYANOCOBALAMIN 1000 MCG/ML IJ SOLN
1000.0000 ug | Freq: Once | INTRAMUSCULAR | Status: AC
  Administered 2013-12-17: 1000 ug via INTRAMUSCULAR

## 2013-12-17 NOTE — Assessment & Plan Note (Signed)
Cards has rec indefinite ASA & plavix 2/2 proximal stenosis. Also on BB and statin. Asymptomatic.

## 2013-12-17 NOTE — Assessment & Plan Note (Signed)
Check BMP today. Reviewed trends with Ms Arriola

## 2013-12-17 NOTE — Assessment & Plan Note (Addendum)
A1C is trending down but still elevated. She is working at [employer] in [employer] class three times weekly and also loosing a bit of weight. AM fasting CBG's a bit high but later in day consistently high. No lows. She is using her Lantus 36 units and her novolog. Novolog is 3 pre meal and correction scale and she seems to understand this. Eats breakfast and dinner regularly but lunch is more snacky and doesn't check her CBG and therefore use novolog. Since she is exercising now and A1C coming down, I will wait another 3 months. If still not at goal will need lunch coverage even if snacky.  Wt Readings from Last 3 Encounters:  12/17/13 189 lb 14.4 oz (86.138 kg)  11/04/13 190 lb 14.4 oz (86.592 kg)  10/01/13 191 lb 11.2 oz (86.955 kg)

## 2013-12-17 NOTE — Assessment & Plan Note (Signed)
She has sxs of a viral URI and I encouraged her to wait as they will likely resolve over time. To call if no better by 5th or if worse.

## 2013-12-17 NOTE — Assessment & Plan Note (Signed)
She has had pain in R shoulder since Feb when she had her lumpectomy. Hurts mostly at night and she moves it around to ease pain. She had steroid per ortho to the chagrin of her surgeon that helped per ortho notes but not per pt. Decreased ROM. Refer to sports med.

## 2013-12-17 NOTE — Patient Instructions (Signed)
1. Referral to sports medicine 2. If cold no better by Monday, please let us know 3. Continue exercising. This will help with your sugar 4. If sugar increases, please let us know

## 2013-12-17 NOTE — Assessment & Plan Note (Signed)
BP Readings from Last 3 Encounters:  12/17/13 135/58  11/04/13 130/55  10/01/13 123/60   Good control on Lisinopril 10, lasix 20 BID, Atenolol 50 BID, and norvac 10. Check BMP and cont meds.

## 2013-12-17 NOTE — Assessment & Plan Note (Addendum)
Baseline about 10 but two recent at 8.6. On FeSO4 and B 12. Will check EPO levell that Dr [name] requested bc if cont to be in 8's and EPO low, would qualify for EPO.

## 2013-12-17 NOTE — Progress Notes (Signed)
   Subjective:    Patient ID: [name], female    DOB: 09-07-1945, 68 y.o.   MRN: [id number]  HPI  Please see the A&P for the status of the pt's chronic medical problems.   Review of Systems  Constitutional: Negative for fever and chills.  HENT: Positive for congestion, rhinorrhea, sinus pressure and sneezing. Negative for mouth sores and postnasal drip.   Eyes: Negative for discharge and itching.  Respiratory: Negative for cough and shortness of breath.   Cardiovascular: Positive for leg swelling. Negative for chest pain.  Skin: Negative for rash.  Neurological: Positive for headaches.  Psychiatric/Behavioral: Negative for sleep disturbance.       Objective:   Physical Exam  Constitutional: She is oriented to person, place, and time. She appears well-developed and well-nourished. No distress.  HENT:  Head: Normocephalic and atraumatic.  Right Ear: External ear normal.  Nose: Nose normal.  Mouth/Throat: Oropharynx is clear and moist.  Slight tenderness to palp over frontal sinuses B  Eyes: Conjunctivae and EOM are normal.  Neck: Normal range of motion. Neck supple.  Cardiovascular: Normal rate, regular rhythm and normal heart sounds.   Pulmonary/Chest: Effort normal and breath sounds normal.  Musculoskeletal: She exhibits edema.  Trace LE B  Lymphadenopathy:    She has no cervical adenopathy.  Neurological: She is alert and oriented to person, place, and time.  Skin: Skin is warm. She is not diaphoretic.  Psychiatric: She has a normal mood and affect. Her behavior is normal. Judgment and thought content normal.          Assessment & Plan:

## 2013-12-18 LAB — BASIC METABOLIC PANEL WITH GFR
BUN: 29 mg/dL — ABNORMAL HIGH (ref 6–23)
CALCIUM: 9.3 mg/dL (ref 8.4–10.5)
CO2: 25 mEq/L (ref 19–32)
CREATININE: 1.25 mg/dL — AB (ref 0.50–1.10)
Chloride: 105 mEq/L (ref 96–112)
GFR, EST NON AFRICAN AMERICAN: 45 mL/min — AB
GFR, Est African American: 52 mL/min — ABNORMAL LOW
Glucose, Bld: 115 mg/dL — ABNORMAL HIGH (ref 70–99)
Potassium: 4.9 mEq/L (ref 3.5–5.3)
SODIUM: 139 meq/L (ref 135–145)

## 2013-12-18 LAB — CBC
HCT: 27.8 % — ABNORMAL LOW (ref 36.0–46.0)
Hemoglobin: 9.3 g/dL — ABNORMAL LOW (ref 12.0–15.0)
MCH: 25.5 pg — ABNORMAL LOW (ref 26.0–34.0)
MCHC: 33.5 g/dL (ref 30.0–36.0)
MCV: 76.2 fL — ABNORMAL LOW (ref 78.0–100.0)
PLATELETS: 250 10*3/uL (ref 150–400)
RBC: 3.65 MIL/uL — AB (ref 3.87–5.11)
RDW: 16.2 % — AB (ref 11.5–15.5)
WBC: 4.9 10*3/uL (ref 4.0–10.5)

## 2013-12-21 LAB — ERYTHROPOIETIN: Erythropoietin: 31.7 m[IU]/mL — ABNORMAL HIGH (ref 2.6–18.5)

## 2013-12-22 ENCOUNTER — Ambulatory Visit: Payer: Medicare Other | Attending: General Surgery | Admitting: Physical Therapy

## 2013-12-22 DIAGNOSIS — M25511 Pain in right shoulder: Secondary | ICD-10-CM | POA: Insufficient documentation

## 2013-12-22 DIAGNOSIS — Z5189 Encounter for other specified aftercare: Secondary | ICD-10-CM | POA: Diagnosis present

## 2013-12-22 DIAGNOSIS — M24511 Contracture, right shoulder: Secondary | ICD-10-CM | POA: Diagnosis not present

## 2013-12-24 ENCOUNTER — Ambulatory Visit: Payer: Medicare Other | Admitting: Physical Therapy

## 2013-12-24 DIAGNOSIS — Z5189 Encounter for other specified aftercare: Secondary | ICD-10-CM | POA: Diagnosis not present

## 2013-12-25 ENCOUNTER — Other Ambulatory Visit: Payer: Self-pay | Admitting: *Deleted

## 2013-12-25 MED ORDER — GLUCOSE BLOOD VI STRP: ORAL_STRIP | Status: DC

## 2013-12-25 NOTE — Telephone Encounter (Signed)
Please check directions Thanks

## 2013-12-28 ENCOUNTER — Other Ambulatory Visit: Payer: Self-pay | Admitting: *Deleted

## 2013-12-28 ENCOUNTER — Ambulatory Visit: Payer: Medicare Other | Admitting: Physical Therapy

## 2013-12-28 DIAGNOSIS — Z5189 Encounter for other specified aftercare: Secondary | ICD-10-CM | POA: Diagnosis not present

## 2013-12-28 MED ORDER — GLUCOSE BLOOD VI STRP: ORAL_STRIP | Status: DC

## 2013-12-28 NOTE — Telephone Encounter (Signed)
Need new rx w/new dx code. Sorry Thanks

## 2013-12-31 ENCOUNTER — Ambulatory Visit: Payer: Medicare Other | Admitting: Physical Therapy

## 2013-12-31 DIAGNOSIS — Z5189 Encounter for other specified aftercare: Secondary | ICD-10-CM | POA: Diagnosis not present

## 2014-01-05 ENCOUNTER — Encounter: Payer: Self-pay | Admitting: Internal Medicine

## 2014-01-05 ENCOUNTER — Ambulatory Visit: Payer: Medicare Other | Admitting: Physical Therapy

## 2014-01-05 DIAGNOSIS — Z5189 Encounter for other specified aftercare: Secondary | ICD-10-CM | POA: Diagnosis not present

## 2014-01-06 ENCOUNTER — Ambulatory Visit: Payer: Medicare Other | Admitting: Physical Therapy

## 2014-01-11 ENCOUNTER — Ambulatory Visit: Payer: Medicare Other | Admitting: Physical Therapy

## 2014-01-11 DIAGNOSIS — Z5189 Encounter for other specified aftercare: Secondary | ICD-10-CM | POA: Diagnosis not present

## 2014-01-14 ENCOUNTER — Ambulatory Visit: Payer: Medicare Other

## 2014-01-14 DIAGNOSIS — Z5189 Encounter for other specified aftercare: Secondary | ICD-10-CM | POA: Diagnosis not present

## 2014-01-16 ENCOUNTER — Other Ambulatory Visit: Payer: Self-pay | Admitting: Internal Medicine

## 2014-01-18 NOTE — Telephone Encounter (Signed)
One yr supply Rx'd 08/2013. Should have RF

## 2014-01-22 NOTE — Addendum Note (Signed)
Addended by: Hulan Fray on: 01/22/2014 06:47 PM   Modules accepted: Orders

## 2014-01-25 ENCOUNTER — Other Ambulatory Visit: Payer: Self-pay | Admitting: Internal Medicine

## 2014-01-26 ENCOUNTER — Other Ambulatory Visit: Payer: Self-pay | Admitting: *Deleted

## 2014-01-26 MED ORDER — FERROUS FUMARATE 325 (106 FE) MG PO TABS: 1.0000 | ORAL_TABLET | Freq: Every day | ORAL | Status: DC

## 2014-01-26 NOTE — Telephone Encounter (Signed)
The other three meds were Rx one yr in June.

## 2014-01-27 ENCOUNTER — Ambulatory Visit: Payer: Medicare Other

## 2014-02-02 ENCOUNTER — Ambulatory Visit: Payer: Medicare Other | Attending: General Surgery | Admitting: Physical Therapy

## 2014-02-02 DIAGNOSIS — M24511 Contracture, right shoulder: Secondary | ICD-10-CM | POA: Diagnosis not present

## 2014-02-02 DIAGNOSIS — M25511 Pain in right shoulder: Secondary | ICD-10-CM | POA: Insufficient documentation

## 2014-02-02 DIAGNOSIS — Z5189 Encounter for other specified aftercare: Secondary | ICD-10-CM | POA: Diagnosis present

## 2014-02-02 NOTE — Therapy (Addendum)
Physical Therapy Treatment  Patient Details  Name: [name] MRN: [id number] Date of Birth: 08-03-1945  Encounter Date: 02/02/2014      PT End of Session - 02/02/14 1704    PT Start Time 1608   PT Stop Time 1647   PT Time Calculation (min) 39 min   Activity Tolerance Patient tolerated treatment well      Past Medical History  Diagnosis Date  . Hypertension     2006 B renal arteries patent. 2003 MRA - no RAS. 2003 pheo W/U Dr Hassell Done reportedly negative.  Marland Kitchen CAD (coronary artery disease) 1996    1996 - PTCA and angioplasty diagonal branch. 2000 - Rotoblator & angiopllasty of diagonal. 2006 - subendocardial AMI, DES to proximal LAD.Marland Kitchen Also had 90% stenosis in distal apical LAD. EF 55 with apical hypokinesis. Indefinite ASA and Plavix.  . Abdominal discomfort     Chronic N/V/D. Presumptive dx Crohn's dx per elevated p ANCA. Failed Entocort and Pentasa. Sep 2003 - ileocolectomy c anastomosis per Dr Deon Pilling 2/2 adhesions - path was hegative for Crohns. EGD, Sm bowel follow through (11/03), and an eteroclysis (10/03) were unrevealing. Cuases hypomag and hypocalcemia.  . Anemia     Multifactorial. Baseline HgB 10-11 ish. B12 def - 150 in 3/10. Fe Def - ferritin 35 3/10. Both are being repleted.  . Stroke     Incidental finding MRI 2002 L lacunar infarct  . Diabetes mellitus     Insulin dependent  . Allergy     Seasonal  . Chronic pain     CT 10/10 = Spinal stenosis L2 - S1.  Marland Kitchen Chronic kidney disease     Chronic renal insuff baseline Cr 1.2 - 1.4 ish.  . Adnexal mass 8/03    s/p lap BSO (R ovarian fibroma) & lysis of adhesions  . Hyperlipidemia     Managed with both a statin and Welchol. Welchol stopped 2014 2/2 cost and started on fenofibrate   . Wears dentures     top  . RBBB   . Breast cancer 03/16/13    right, 5 o'clock  . Hx of radiation therapy 06/02/13- 07/16/13    right rbeast 4500 cGy 25 sessions, right breast boost 1600 cGy in 8 sessions  . Lupus     Past Surgical  History  Procedure Laterality Date  . Ptca  4/06  . Bowel resection  2003    ileocolectomy with anastomosis 2/2 adhesions  . Cholecystectomy    . Hernia repair      Ventral hernia repair  . Bilateral salpingoophorectomy  8/03    Lap BSO (R ovarian fibroma) and adhesion lysis  . Hemicolectomy      R sided hemicolectomy  . Cardiac catheterization      2 stents  . Abdominal hysterectomy    . Breast lumpectomy with needle localization and axillary sentinel lymph node bx Right 04/22/2013    Procedure: BREAST LUMPECTOMY WITH NEEDLE LOCALIZATION AND AXILLARY SENTINEL LYMPH NODE BX;  Surgeon: Stark Klein, MD;  Location: [place];  Service: General;  Laterality: Right;    There were no vitals taken for this visit.  Visit Diagnosis:  Pain in joint, shoulder region, right - Plan: PT plan of care cert/re-cert  Shoulder joint contracture, right - Plan: PT plan of care cert/re-cert      Subjective Assessment - 02/02/14 1725    Symptoms pt is not feeling well today, she says she continues to have pain in her arm and doesn't  feel it is that much better.  She does not want to go to the orthopedic doctor because she does not want to get a shot          [facility] PT Assessment - 02/02/14 0001    AROM   Right Shoulder Flexion 156 Degrees   Right Shoulder ABduction 134 Degrees   Right Shoulder Internal Rotation 10 Degrees  painful with pt pulling away   Right Shoulder External Rotation 50 Degrees          [facility] Adult PT Treatment/Exercise - 02/02/14 0001    Neck Exercises: Stretches   Lower Cervical/Upper Thoracic Stretch Limitations in sitting with towel and assit at elbows for increased throacic extension   Shoulder Exercises: Supine   Protraction 10 reps;AROM   External Rotation AROM;10 reps;Weights   External Rotation Weight (lbs) 2   Flexion AROM;10 reps   Other Supine Exercises dowel exercise to ceiling and to reach overahead   Other Supine Exercises isomterics to  deltoid with arm and side and arm at 90 degrees flexion   Shoulder Exercises: Standing   Other Standing Exercises closed chain with elbows on wall and slide up into shoulder flexion a few degrees   Manual Therapy   Joint Mobilization glenohumeral joing traction and posterior/ anterior glides x 20 reps    Myofascial Release at pec major and pec minor at anterior chest and shoulder   Scapular Mobilization in sitting for scapular retraction and downward rotation   Passive ROM to glenohumeral joint in shoulder flexion and horizontal adduction           Plan - 02/02/14 1705    Clinical Impression Statement Pt not feeling well but still able to participate wiht PT.  She has improved active range of motion, but continues to have increased pain. Encourged patient to continue home exercise program   PT Next Visit Plan continue with strentheing exercise with manual tecniques for directed isometric strenthening and weights as tolerated.  continue scapular strengthening        Problem List Patient Active Problem List   Diagnosis Date Noted  . Breast cancer of lower-inner quadrant of right female breast 03/18/2013  . Chronic venous insufficiency 01/06/2013  . Health care maintenance 05/08/2011  . DM (diabetes mellitus), type 2, uncontrolled, with renal complications 09/38/1829  . Seasonal allergies 05/03/2010  . Hypertension   . CAD (coronary artery disease)   . Abdominal discomfort   . Anemia   . Stroke   . Hyperlipidemia   . Chronic pain   . Chronic kidney disease, stage III (moderate)   . Adnexal mass             Long Term Clinic Goals - 02/02/14 1712    CC Long Term Goal  #1   Title be independent in a home program   Time 6   Period Weeks   Status On-going   CC Long Term Goal  #2   Title report overall pain decrased >/=75% to tolerate daily tasks with less pain   Time 6   Status On-going   CC Long Term Goal  #3   Title incrase right shoulder flexion range of motoin to  >/= 110 degrees for increased functional use of arm   Time 6   Period Weeks   Status On-going   CC Long Term Goal  #4   Title increase right shoulder abduction range of motion to >/= 120 degrees for functional use of arm   Time 6  Period Weeks       PHYSICAL THERAPY DISCHARGE SUMMARY Pt was seen during transitionto Epic so number of visits is unknown   Current functional level related to goals / functional outcomes: Pt stopped treatment due to ongoing pain in shoulder and not feeling well  Refer to goals above    Remaining deficits: Pain and decreased shoulder range of motin    Education / Equipment: Home exercise  Plan: Patient agrees to discharge.  Patient goals were not met. Patient is being discharged due to a change in medical status.  ?????       Donato Heinz. Owens Shark, PT  02/02/2014, 5:26 PM

## 2014-02-05 ENCOUNTER — Other Ambulatory Visit: Payer: Self-pay

## 2014-02-05 DIAGNOSIS — C50311 Malignant neoplasm of lower-inner quadrant of right female breast: Secondary | ICD-10-CM

## 2014-02-05 MED ORDER — ANASTROZOLE 1 MG PO TABS: 1.0000 mg | ORAL_TABLET | Freq: Every day | ORAL | Status: DC

## 2014-02-09 ENCOUNTER — Ambulatory Visit: Payer: Medicare Other | Admitting: Physical Therapy

## 2014-02-15 ENCOUNTER — Ambulatory Visit
Admission: RE | Admit: 2014-02-15 | Discharge: 2014-02-15 | Disposition: A | Discharge status: Home / Self Care | Payer: Medicare Other | Source: Ambulatory Visit | Attending: Oncology | Admitting: Oncology

## 2014-02-15 DIAGNOSIS — C50311 Malignant neoplasm of lower-inner quadrant of right female breast: Secondary | ICD-10-CM

## 2014-02-28 ENCOUNTER — Other Ambulatory Visit: Payer: Self-pay | Admitting: Oncology

## 2014-02-28 DIAGNOSIS — C50311 Malignant neoplasm of lower-inner quadrant of right female breast: Secondary | ICD-10-CM

## 2014-03-03 ENCOUNTER — Ambulatory Visit (HOSPITAL_BASED_OUTPATIENT_CLINIC_OR_DEPARTMENT_OTHER): Payer: Medicare Other | Admitting: Oncology

## 2014-03-03 ENCOUNTER — Telehealth: Payer: Self-pay | Admitting: Oncology

## 2014-03-03 ENCOUNTER — Encounter: Payer: Self-pay | Admitting: Oncology

## 2014-03-03 ENCOUNTER — Other Ambulatory Visit (HOSPITAL_BASED_OUTPATIENT_CLINIC_OR_DEPARTMENT_OTHER): Payer: Medicare Other

## 2014-03-03 VITALS — BP 155/60 | HR 79 | Temp 98.2°F | Resp 18 | Ht 64.0 in | Wt 188.9 lb

## 2014-03-03 DIAGNOSIS — C50311 Malignant neoplasm of lower-inner quadrant of right female breast: Secondary | ICD-10-CM

## 2014-03-03 DIAGNOSIS — D649 Anemia, unspecified: Secondary | ICD-10-CM

## 2014-03-03 DIAGNOSIS — M25551 Pain in right hip: Secondary | ICD-10-CM

## 2014-03-03 DIAGNOSIS — N189 Chronic kidney disease, unspecified: Secondary | ICD-10-CM

## 2014-03-03 DIAGNOSIS — N183 Chronic kidney disease, stage 3 (moderate): Secondary | ICD-10-CM

## 2014-03-03 DIAGNOSIS — I890 Lymphedema, not elsewhere classified: Secondary | ICD-10-CM

## 2014-03-03 LAB — CBC & DIFF AND RETIC
BASO%: 0.1 % (ref 0.0–2.0)
BASOS ABS: 0 10*3/uL (ref 0.0–0.1)
EOS ABS: 0.1 10*3/uL (ref 0.0–0.5)
EOS%: 1.2 % (ref 0.0–7.0)
HCT: 29.3 % — ABNORMAL LOW (ref 34.8–46.6)
HEMOGLOBIN: 9.4 g/dL — AB (ref 11.6–15.9)
Immature Retic Fract: 9.9 % (ref 1.60–10.00)
LYMPH%: 19.8 % (ref 14.0–49.7)
MCH: 26 pg (ref 25.1–34.0)
MCHC: 32.1 g/dL (ref 31.5–36.0)
MCV: 81.2 fL (ref 79.5–101.0)
MONO#: 0.3 10*3/uL (ref 0.1–0.9)
MONO%: 3.7 % (ref 0.0–14.0)
NEUT%: 75.2 % (ref 38.4–76.8)
NEUTROS ABS: 5.1 10*3/uL (ref 1.5–6.5)
Platelets: 280 10*3/uL (ref 145–400)
RBC: 3.61 10*6/uL — ABNORMAL LOW (ref 3.70–5.45)
RDW: 14.7 % — ABNORMAL HIGH (ref 11.2–14.5)
RETIC CT ABS: 61.37 10*3/uL (ref 33.70–90.70)
Retic %: 1.7 % (ref 0.70–2.10)
WBC: 6.8 10*3/uL (ref 3.9–10.3)
lymph#: 1.4 10*3/uL (ref 0.9–3.3)

## 2014-03-03 LAB — URINALYSIS, MICROSCOPIC - CHCC
BILIRUBIN (URINE): NEGATIVE
Blood: NEGATIVE
GLUCOSE UR CHCC: NEGATIVE mg/dL
KETONES: NEGATIVE mg/dL
Leukocyte Esterase: NEGATIVE
Nitrite: NEGATIVE
PH: 5 (ref 4.6–8.0)
Protein: <30 mg/dL
Specific Gravity, Urine: 1.02 (ref 1.003–1.035)
Urobilinogen, UR: 0.2 mg/dL (ref 0.2–1)

## 2014-03-03 LAB — COMPREHENSIVE METABOLIC PANEL (CC13)
ALT: 18 U/L (ref 0–55)
AST: 16 U/L (ref 5–34)
Albumin: 3.4 g/dL — ABNORMAL LOW (ref 3.5–5.0)
Alkaline Phosphatase: 134 U/L (ref 40–150)
Anion Gap: 11 mEq/L (ref 3–11)
BUN: 26.5 mg/dL — ABNORMAL HIGH (ref 7.0–26.0)
CO2: 24 mEq/L (ref 22–29)
CREATININE: 1.3 mg/dL — AB (ref 0.6–1.1)
Calcium: 9.4 mg/dL (ref 8.4–10.4)
Chloride: 106 mEq/L (ref 98–109)
EGFR: 50 mL/min/{1.73_m2} — ABNORMAL LOW (ref >90)
Glucose: 199 mg/dl — ABNORMAL HIGH (ref 70–140)
Potassium: 3.9 mEq/L (ref 3.5–5.1)
Sodium: 141 mEq/L (ref 136–145)
Total Protein: 8.3 g/dL (ref 6.4–8.3)

## 2014-03-03 NOTE — Telephone Encounter (Signed)
Gave avs & cal for April. °

## 2014-03-03 NOTE — Progress Notes (Signed)
OFFICE PROGRESS NOTE   03/03/2014   Physicians:Khan, Adele Schilder, [place]; [place], Herbie [place]; [place]; [place], P; [place], Honor Junes, J.  INTERVAL HISTORY:  Patient is seen, alone for visit, in scheduled follow up of stage 1 invasive ductal carcinoma of right breast, for which she has been on Arimidex since 07-2013, tolerating well. She had normal bone density in femur and forearm by DEXA scan done at [facility] 08-26-13; there was diffuse sclerosis L2 thru L4 such that lumbar spine density was not measured. Mammograms bilateral diagnostic at [facility] 02-15-14 had scattered fibroglandular density and no mammographic findings of concern. She saw Dr [name] 02-22-14 with referral made for lymphedema PT for the right breast; she will see Dr [name] again in 6 months.  Significant comorbid conditions include diabetes with renal complications, CKD III, CAD and anemia. She had surgery on lumbar spine ~ 20 years ago for disc problem causing radiation of pain to LLE.  Patient's primary complaint today if of right hip pain, which seems to radiate from low back to RLE. This has been ongoing for 3 weeks, no known trauma, most comfortable when lying down and worse when up, not helped with Aleve. She denies other bone pain. She is established with Dr Dorna Leitz of orthopedics, I believe for shoulder problems. She is not more symptomatic from anemia including no SOB, has seen no bleeding. She sees PCP Dr Lynnae January regularly, next in March. She denies any changes in breasts on self exam. She denies arthralgia symptoms in feet, hands. Energy would be at baseline if not for the right hip symptoms.    Flu vaccine done  ONCOLOGIC HISTORY Screening mammogram on 02/06/2013.possibly showed a mass in the right breast; 02/27/2013 diagnostic mammogram and ultrasound showed 6 x 5 mm mass at 5:00 position. Lymph nodes were not enlarged. She did not have an MRI. She underwent a biopsy on 03/16/2013 this revealed  invasive ductal carcinoma, low grade. Tumor was ER positive PR positive HER-2/neu negative with a proliferation marker Ki-67 of 20%.  #2 s/p lumpectomy with sentinel lymph node biopsy on 05/12/13 with final pathology revealing:  Grade: II of III  Tumor size (gross measurement: 0.9 cm  Lymph nodes: 0/6  Breast prognostic profile:  Estrogen receptor: Not repeated, previous study demonstrated 100% positivity  Progesterone receptor: Not repeated, previous study demonstrated 12% positivity  Her 2 neu: Repeated, previous study demonstrated no amplification (1.79)  #3 Oncotype Dx testing score: Her Oncotype score was 19 giving her a 12% risk of distant recurrence with tamoxifen only for 5 years. She does not want chemotherapy.  #4.Radiation given 06-02-13 thru 07-16-13 4500 Gy to right breast with boost 1600 Gy. #5 began Arimidex 08-06-2013    Review of systems as above, also: No fever or symptoms of infection. No LE swelling. No other pain. Bowels ok. Remainder of 10 point Review of Systems negative.  Objective:  Vital signs in last 24 hours:  BP 155/60 mmHg  Pulse 79  Temp(Src) 98.2 F (36.8 C) (Oral)  Resp 18  Ht _0  (1.626 m)  Wt 188 lb 14.4 oz (85.684 kg)  BMI 32.41 kg/m2  SpO2 100% Weight down 2 lbs. Alert, oriented and appropriate. Ambulatory a little slowly favoring RLE, able to get on and off exam table with minimal assistance.  HEENT:PERRL, sclerae not icteric. Oral mucosa moist without lesions, posterior pharynx clear.  Neck supple. No JVD.  Lymphatics:no cervical,supraclavicular, axillary adenopathy Resp: clear to auscultation bilaterally and normal percussion bilaterally Cardio: regular rate  and rhythm. No gallop. GI: soft, nontender, not distended, no mass or organomegaly. Normally active bowel sounds.  Musculoskeletal/ Extremities: UE and LEwithout pitting edema, cords, tenderness. Laminectomy incision well healed extending at least 10 cm along lumbar spine.  No point tenderness along spine.  Neuro: no peripheral neuropathy. Otherwise nonfocal Skin without rash including low back or right hip/ leg, no ecchymosis or petechiae Breasts: right lumpectomy scar without evidence of local recurrence, still hyperpigmentation in radiation field, some lymphedema in tissues especially inferiorly apparent. Left breast without dominant mass, skin or nipple findings. Axillae benign.   Lab Results:  Results for orders placed or performed in visit on 03/03/14  CBC & Diff and Retic  Result Value Ref Range   WBC 6.8 3.9 - 10.3 10e3/uL   NEUT# 5.1 1.5 - 6.5 10e3/uL   HGB 9.4 (L) 11.6 - 15.9 g/dL   HCT 29.3 (L) 34.8 - 46.6 %   Platelets 280 145 - 400 10e3/uL   MCV 81.2 79.5 - 101.0 fL   MCH 26.0 25.1 - 34.0 pg   MCHC 32.1 31.5 - 36.0 g/dL   RBC 3.61 (L) 3.70 - 5.45 10e6/uL   RDW 14.7 (H) 11.2 - 14.5 %   lymph# 1.4 0.9 - 3.3 10e3/uL   MONO# 0.3 0.1 - 0.9 10e3/uL   Eosinophils Absolute 0.1 0.0 - 0.5 10e3/uL   Basophils Absolute 0.0 0.0 - 0.1 10e3/uL   NEUT% 75.2 38.4 - 76.8 %   LYMPH% 19.8 14.0 - 49.7 %   MONO% 3.7 0.0 - 14.0 %   EOS% 1.2 0.0 - 7.0 %   BASO% 0.1 0.0 - 2.0 %   Retic % 1.70 0.70 - 2.10 %   Retic Ct Abs 61.37 33.70 - 90.70 10e3/uL   Immature Retic Fract 9.90 1.60 - 10.00 %    Hgb had been 8.6 in 10-2013 and 10 in 08-2013, 9.1 in 11-2011 and 9.8 in 04-2010  CMET available after visit with glucose 199, BUN 26, creat 1.3, alb 3.4 with T prot 8.3, ca 9.4. EGFR calculated 50.  UA available after visit negative for blood  Erythropoietin level 37.8  Studies/Results: DIGITAL DIAGNOSTIC BILATERAL MAMMOGRAM WITH [facility]  COMPARISON: 03/16/2013, 02/06/2013, 12/14/2010 and 11/17/2009  ACR Breast Density Category b: There are scattered areas of fibroglandular density.  FINDINGS: Postlumpectomy changes are noted within the subareolar right breast. Metallic clips are present within the breast and right axilla. Expected  postsurgical architectural distortion is noted. Right breast skin thickening is compatible with prior radiation treatment.  The breasts are otherwise without suspicious mass, nonsurgical architectural distortion or malignant type calcifications. MPRESSION: No mammographic evidence of malignancy.     CT ABDOMEN AND PELVIS WITH CONTRAST  09-15-13  COMPARISON: 12/30/2008  FINDINGS: BODY WALL: There is skin thickening of the right breast. Low-density present within the right lumpectomy cavity. Based on colloid injection 2/15, the lumpectomy was recent and these are likely postoperative changes.  LOWER CHEST: Unremarkable.  ABDOMEN/PELVIS:  Liver: No focal abnormality.  Biliary: Cholecystectomy. This accounts for mild enlargement of the common bile duct, likely reservoir effect.  Pancreas: Main pancreatic duct is diffusely prominent, measuring 4 mm at the pancreatic head. Previous non contrast imaging did not resolved the pancreatic duct well, but suspect this is a chronic finding based on those comparison studies. There is no visible mass or stone at the ampulla. The pancreas is atrophic, but expected for age. No parenchymal calcifications.  Spleen: Unremarkable.  Adrenals: Unremarkable.  Kidneys and ureters: 22 mm interpolar right renal cyst. No hydronephrosis or nephrolithiasis.  Bladder: Unremarkable.  Reproductive: Unremarkable.  Bowel: There is abnormal small bowel dilatation and fluid filling leading up to an abrupt transition point in the left lower quadrant where there is distal decompression of bowel loops. At the transition point, the bowel is angulated suggesting adhesion. There is minimal mesenteric congestion. No evidence of bowel devascularization or perforation. Mottled lucency in the affected bowel loops is more consistent with fecalized contents the pneumatosis. Probable resection of the ileocecal valve. Appendix surgically  absent.  Retroperitoneum: No mass or adenopathy.  Peritoneum: No ascites or pneumoperitoneum.  Vascular: No acute abnormality.  OSSEOUS: No acute abnormalities.  IMPRESSION: High-grade distal small bowel obstruction, likely from adhesions.No evidence of bowel necrosis.       Medications: I have reviewed the patient's current medications.  DISCUSSION: patient aware that I will let her know any recommendations after labs all resulted.  Assessment/Plan:  1. T1N0 invasive ductal carcinoma of right breast: post lumpectomy and 6 sentinel nodes 04-22-2013, ER and PR +, HER 2 negative, local radiation and on arimidex since 07-2013 which she will continue. Lymphedema PT for right breast. I will see her in 4 months, with labs. 2.anemia: back at least to 2012. Epo not low. Will suggest also checking SPEP/ UPEP as I do not find those in this EMR at least thru 2012, and hgb electrophoresis if not done. 3.low back, right hip and RLE pain: may be disc related. I have asked her to call [name] as she is established with him and the symptoms have been ongoing for 3 weeks. 4.sclerotic changes in lumbar spine on DEXA, in region of apparent laminectomy. Expect she needs imaging for the low back due to radicular type pain + this. Note CT 08-2013 done for SBO did not find bony abnormalities. 5.diabetes on insulin  6.chronic renal insufficiency  7.. CAD, HTN: followed by Dr Johnsie Cancel  8..post right colectomy (or ileocolectomy). SBO 08-2013 thought from adhesions, resolved with bowel rest. 9..flu vaccine done  Patient followed discussion well and is in agreement with plans above. Time spent 25 min including >50% counseling and coordination of care. Cc this note to Drs Lynnae January and Tresa Garter, MD   03/03/2014, 2:27 PM

## 2014-03-05 LAB — ERYTHROPOIETIN: Erythropoietin: 37.8 m[IU]/mL — ABNORMAL HIGH (ref 2.6–18.5)

## 2014-03-07 ENCOUNTER — Other Ambulatory Visit: Payer: Self-pay | Admitting: Oncology

## 2014-03-07 DIAGNOSIS — D649 Anemia, unspecified: Secondary | ICD-10-CM

## 2014-03-07 DIAGNOSIS — I890 Lymphedema, not elsewhere classified: Secondary | ICD-10-CM | POA: Insufficient documentation

## 2014-03-10 ENCOUNTER — Other Ambulatory Visit: Payer: Self-pay | Admitting: Oncology

## 2014-03-10 ENCOUNTER — Telehealth: Payer: Self-pay

## 2014-03-10 ENCOUNTER — Other Ambulatory Visit: Payer: Self-pay | Admitting: Internal Medicine

## 2014-03-10 DIAGNOSIS — R109 Unspecified abdominal pain: Secondary | ICD-10-CM

## 2014-03-10 DIAGNOSIS — I10 Essential (primary) hypertension: Secondary | ICD-10-CM

## 2014-03-10 NOTE — Telephone Encounter (Signed)
Left message in Ms. Starks VM letting her know that Dr. [name] will not order any additional x-rays at this time as she will let Dr. Berenice Primas order the films. She can call her PCP regarding the discomfort in her low back , hip,and leg. POF sent to scheduling.

## 2014-03-10 NOTE — Telephone Encounter (Signed)
The indication for hydroxyzine is unclear per my review of recent clinic notes and the problem list.  It appears this medication has been chronically prescribed since at least 2012.  I will provide a 1 month supply with no refills and defer to the PCP if it is to be continued long term.

## 2014-03-10 NOTE — Telephone Encounter (Signed)
Patient apparently carries a presumptive diagnosis of Chron's disease and has occasional diarrhea (last noted in ROS from July 2015).  Will sign refill with no additional refills to assure the medication is appropriate per the PCP.

## 2014-03-10 NOTE — Telephone Encounter (Addendum)
Spoke with Ms. Schimek regarding her anemia and backand leg pain as noted below. Shis is open to having further lab work done and told her that our schedulers will be in touch with an appointment. Her hip,lowbak, and leg pain is the same as reported at visit with Dr. [name] on 03-03-14. She has an appointment with Dr. Berenice Primas ~ the first week in January.  She is at work and does not have her calendar.

## 2014-03-10 NOTE — Telephone Encounter (Signed)
-----   Message from Gordy Levan, MD sent at 03/07/2014  9:35 AM EST ----- Please let her know that anemia does not seem related to her mild kidney problems. I would like to check protein studies blood and urine and another hemoglobin test just to look into the anemia further; if she is ok with this, could get the bloodwork any time in next several weeks. (orders in, POF not done)  Please ask how her hip/ low back/ leg are doing. Did she get in touch with [name]? If still problems and has not seen [name], please ask me if she needs other xrays.  thanks

## 2014-03-11 ENCOUNTER — Telehealth: Payer: Self-pay | Admitting: Oncology

## 2014-03-11 NOTE — Telephone Encounter (Signed)
, °

## 2014-03-23 ENCOUNTER — Ambulatory Visit (INDEPENDENT_AMBULATORY_CARE_PROVIDER_SITE_OTHER): Payer: Medicare Other | Admitting: Internal Medicine

## 2014-03-23 ENCOUNTER — Encounter: Payer: Self-pay | Admitting: Internal Medicine

## 2014-03-23 VITALS — BP 155/60 | HR 69 | Temp 98.1°F | Ht 64.0 in | Wt 195.8 lb

## 2014-03-23 DIAGNOSIS — G8929 Other chronic pain: Secondary | ICD-10-CM

## 2014-03-23 MED ORDER — CYCLOBENZAPRINE HCL 5 MG PO TABS: 5.0000 mg | ORAL_TABLET | Freq: Every evening | ORAL | Status: DC | PRN

## 2014-03-23 NOTE — Assessment & Plan Note (Addendum)
Pt p/w right lower back pain radiating into the RLE just above the ankle that began around Thanksgiving 2015. She describes what sounds like a diskectomy 20 years ago by Dr. Berenice Primas.  Denies fever/chills, weight loss, or other systemic s/s.  No urinary symptoms or h/o nephrolithiasis.  No osteoporosis.  She describes the pain as worse toward the end of the day.  States the pain is sharp and is relieved by lying down.  Tylenol and ibuprofen have helped somewhat.  She is still able to work in daycare.  She is followed by Dr. [name] for breast cancer on anastrazole.  On exam, LE strength is 5/5 b/l with intact sensation and reflexes b/l.  Neg SLR b/l.  No red flags.  Dr. [name] had recommended referral back to Dr. Berenice Primas for further evaluation and mgmt. -referral to Dr. Berenice Primas  -flexeril 59m at bedtime as needed (patient states this has helped her in the past and specifically requests this) -advised warm heating pad and baths  -tylenol PRN for pain (not to exceed 40054md)

## 2014-03-23 NOTE — Patient Instructions (Signed)
Thank you for your visit today.   Please return to the internal medicine clinic as needed.  Your current medical regimen is effective;  continue present plan and take all medications as prescribed.   I will refer you to Dr. Berenice Primas for your lower back pain.  You may try a warm heating pad, warm bath, etc.  You may use tylenol for pain-->not to exceed 407m/day.   Continue your usual activities.   Please be sure to bring all of your medications with you to every visit; this includes herbal supplements, vitamins, eye drops, and any over-the-counter medications.   Should you have any questions regarding your medications and/or any new or worsening symptoms, please be sure to call the clinic at [phone]   If you believe that you are suffering from a life threatening condition or one that may result in the loss of limb or function, then you should call 911 or proceed to the nearest Emergency Department.     A healthy lifestyle and preventative care can promote health and wellness.   Maintain regular health, dental, and eye exams.  Eat a healthy diet. Foods like vegetables, fruits, whole grains, low-fat dairy products, and lean protein foods contain the nutrients you need without too many calories. Decrease your intake of foods high in solid fats, added sugars, and salt. Get information about a proper diet from your caregiver, if necessary.  Regular physical exercise is one of the most important things you can do for your health. Most adults should get at least 150 minutes of moderate-intensity exercise (any activity that increases your heart rate and causes you to sweat) each week. In addition, most adults need muscle-strengthening exercises on 2 or more days a week.   Maintain a healthy weight. The body mass index (BMI) is a screening tool to identify possible weight problems. It provides an estimate of body fat based on height and weight. Your caregiver can help determine your BMI, and  can help you achieve or maintain a healthy weight. For adults 20 years and older:  A BMI below 18.5 is considered underweight.  A BMI of 18.5 to 24.9 is normal.  A BMI of 25 to 29.9 is considered overweight.  A BMI of 30 and above is considered obese.

## 2014-03-23 NOTE — Progress Notes (Signed)
Patient ID: [name], female   DOB: [birth year], 69 y.o.   MRN: [id number]    Subjective:   Patient ID: [name] female    DOB: April 17, 1945 69 y.o.    MRN: [id number] Health Maintenance Due: Health Maintenance Due  Topic Date Due  . ZOSTAVAX  07/02/2007  . FOOT EXAM  01/06/2014  . HEMOGLOBIN A1C  03/19/2014    _________________________________________________  HPI: [name] is a 69 y.o. female here for a acute visit for lower back pain.  Pt has a PMH outlined below.  Please see problem-based charting assessment and plan note for further details of medical issues addressed at today's visit.  PMH: Past Medical History  Diagnosis Date  . Hypertension     2006 B renal arteries patent. 2003 MRA - no RAS. 2003 pheo W/U Dr Hassell Done reportedly negative.  Marland Kitchen CAD (coronary artery disease) 1996    1996 - PTCA and angioplasty diagonal branch. 2000 - Rotoblator & angiopllasty of diagonal. 2006 - subendocardial AMI, DES to proximal LAD.Marland Kitchen Also had 90% stenosis in distal apical LAD. EF 55 with apical hypokinesis. Indefinite ASA and Plavix.  . Abdominal discomfort     Chronic N/V/D. Presumptive dx Crohn's dx per elevated p ANCA. Failed Entocort and Pentasa. Sep 2003 - ileocolectomy c anastomosis per Dr Deon Pilling 2/2 adhesions - path was hegative for Crohns. EGD, Sm bowel follow through (11/03), and an eteroclysis (10/03) were unrevealing. Cuases hypomag and hypocalcemia.  . Anemia     Multifactorial. Baseline HgB 10-11 ish. B12 def - 150 in 3/10. Fe Def - ferritin 35 3/10. Both are being repleted.  . Stroke     Incidental finding MRI 2002 L lacunar infarct  . Diabetes mellitus     Insulin dependent  . Allergy     Seasonal  . Chronic pain     CT 10/10 = Spinal stenosis L2 - S1.  Marland Kitchen Chronic kidney disease     Chronic renal insuff baseline Cr 1.2 - 1.4 ish.  . Adnexal mass 8/03    s/p lap BSO (R ovarian fibroma) & lysis of adhesions  . Hyperlipidemia     Managed with both a statin and Welchol.  Welchol stopped 2014 2/2 cost and started on fenofibrate   . Wears dentures     top  . RBBB   . Breast cancer 03/16/13    right, 5 o'clock  . Hx of radiation therapy 06/02/13- 07/16/13    right rbeast 4500 cGy 25 sessions, right breast boost 1600 cGy in 8 sessions  . Lupus     Medications: Current Outpatient Prescriptions on File Prior to Visit  Medication Sig Dispense Refill  . amLODipine (NORVASC) 10 MG tablet Take 1 tablet (10 mg total) by mouth daily. 90 tablet 3  . anastrozole (ARIMIDEX) 1 MG tablet Take 1 tablet (1 mg total) by mouth daily. 30 tablet 3  . aspirin EC 81 MG tablet Take 81 mg by mouth daily.    Marland Kitchen atenolol (TENORMIN) 50 MG tablet Take 50 mg by mouth 2 (two) times daily.    Marland Kitchen atorvastatin (LIPITOR) 20 MG tablet Take 20 mg by mouth at bedtime.    . clopidogrel (PLAVIX) 75 MG tablet TAKE ONE (1) TABLET EACH DAY 90 tablet 3  . cyanocobalamin (,VITAMIN B-12,) 1000 MCG/ML injection INJECT 1 ML INTRAMUSCULARLY EVERY 30 DAYS 10 mL 2  . desloratadine (CLARINEX) 5 MG tablet Take 5 mg by mouth daily as needed (allergies).     . fenofibrate (TRICOR)  48 MG tablet Take 48 mg by mouth daily.    . ferrous fumarate (HEMOCYTE - 106 MG FE) 325 (106 FE) MG TABS tablet Take 1 tablet (106 mg of iron total) by mouth daily. 30 each 1  . furosemide (LASIX) 20 MG tablet TAKE ONE TABLET BY MOUTH TWICE DAILY 180 tablet 3  . glucose blood (ONE TOUCH TEST STRIPS) test strip Use to Check Blood Sugars 2-3 Times Daily. Dx Code:E11.29. 100 each 12  . hydrOXYzine (VISTARIL) 50 MG capsule Take 1 capsule (50 mg total) by mouth at bedtime as needed. 30 capsule 0  . insulin glargine (LANTUS) 100 UNIT/ML injection Inject 0.36 mLs (36 Units total) into the skin at bedtime. 60 mL 3  . insulin lispro (HUMALOG) 100 UNIT/ML injection Inject subcutaneously three times daily with breakfast, lunch and dinner. Take 3 units for meal coverage plus correction for your pre-meal blood sugar as instructed. 10 mL 3  .  lisinopril (PRINIVIL,ZESTRIL) 10 MG tablet Take 10 mg by mouth daily.    Marland Kitchen loperamide (IMODIUM) 2 MG capsule Take 1 capsule (2 mg total) by mouth daily as needed for diarrhea or loose stools. 30 capsule 0  . mometasone (NASONEX) 50 MCG/ACT nasal spray Place 2 sprays into the nose daily.    . nitroGLYCERIN (NITROSTAT) 0.4 MG SL tablet Place 0.4 mg under the tongue every 5 (five) minutes as needed for chest pain.    . potassium chloride (K-DUR) 10 MEQ tablet Take 2 tablets (20 mEq total) by mouth daily. 180 tablet 3  . WELCHOL 625 MG tablet TAKE THREE TABLETS BY MOUTH ONCE DAILY 270 tablet 3   No current facility-administered medications on file prior to visit.    Allergies: Allergies  Allergen Reactions  . Percocet [Oxycodone-Acetaminophen] Other (See Comments)    headaches  . Diazepam     REACTION: Agitation  . Haloperidol Lactate   . Lorazepam Nausea Only  . Morphine Sulfate     REACTION: Agitation  . Propoxyphene Hcl     REACTION: Vomiting  . Tramadol Hcl     REACTION: sick on stomach    FH: Family History  Problem Relation Age of Onset  . Pancreatic cancer Brother 34  . Cancer Mother   . Breast cancer Mother 18  . Hypertension Daughter   . Lung cancer Maternal Aunt   . Prostate cancer Maternal Uncle   . Heart attack Maternal Grandmother   . Kidney failure Maternal Aunt     SH: History   Social History  . Marital Status: Legally Separated    Spouse Name: N/A    Number of Children: 4  . Years of Education: N/A   Occupational History  .     Social History Main Topics  . Smoking status: Never Smoker   . Smokeless tobacco: Never Used  . Alcohol Use: No  . Drug Use: No  . Sexual Activity: Yes   Other Topics Concern  . None   Social History Narrative    Review of Systems: Constitutional: Negative for fever, chills and weight loss.  Eyes: Negative for blurred vision.  Respiratory: Negative for cough and shortness of breath.  Cardiovascular: Negative for  chest pain, palpitations and leg swelling.  Gastrointestinal: Negative for nausea, vomiting, abdominal pain, diarrhea, constipation and blood in stool.  Genitourinary: Negative for dysuria, urgency and frequency.  Musculoskeletal: Negative for myalgias and +back pain.  Neurological: Negative for dizziness, weakness and headaches.     Objective:   Vital Signs: Filed  Vitals:   03/23/14 1033  BP: 155/60  Pulse: 69  Temp: 98.1 F (36.7 C)  TempSrc: Oral  Height: 5' 4"  (1.626 m)  Weight: 195 lb 12.8 oz (88.814 kg)  SpO2: 100%      BP Readings from Last 3 Encounters:  03/23/14 155/60  03/03/14 155/60  12/17/13 135/58    Physical Exam: Constitutional: Vital signs reviewed.  Patient is well-developed and well-nourished in NAD and cooperative with exam.  Head: Normocephalic and atraumatic. Eyes: PERRL, EOMI, conjunctivae nl, no scleral icterus.  Neck: Supple. Cardiovascular: RRR, no MRG. Pulmonary/Chest: normal effort, CTAB, no wheezes, rales, or rhonchi. Abdominal: Soft. NT/ND +BS. Musculoskeletal: Full ROM of lumbar spine, -SLR b/l, patellar reflexes 2+, no deformity or spinal tenderness.   Neurological: A&O x3, cranial nerves II-XII are grossly intact, moving all extremities. Extremities: 2+DP b/l; no pitting edema. Skin: Warm, dry and intact. No rash.   Assessment & Plan:   Assessment and plan was discussed and formulated with my attending.

## 2014-03-24 NOTE — Progress Notes (Signed)
Internal Medicine Clinic Attending  Case discussed with Dr. Gill soon after the resident saw the patient.  We reviewed the resident's history and exam and pertinent patient test results.  I agree with the assessment, diagnosis, and plan of care documented in the resident's note.  

## 2014-03-30 ENCOUNTER — Other Ambulatory Visit: Payer: Medicare Other

## 2014-03-30 DIAGNOSIS — D649 Anemia, unspecified: Secondary | ICD-10-CM | POA: Diagnosis not present

## 2014-04-01 LAB — PROTEIN ELECTROPHORESIS, SERUM, WITH REFLEX
ALPHA-2-GLOBULIN: 11.5 % (ref 7.1–11.8)
Albumin ELP: 47.1 % — ABNORMAL LOW (ref 55.8–66.1)
Alpha-1-Globulin: 4.2 % (ref 2.9–4.9)
BETA 2: 8.1 % — AB (ref 3.2–6.5)
Beta Globulin: 6.7 % (ref 4.7–7.2)
Gamma Globulin: 22.4 % — ABNORMAL HIGH (ref 11.1–18.8)
TOTAL PROTEIN, SERUM ELECTROPHOR: 8.3 g/dL (ref 6.0–8.3)

## 2014-04-01 LAB — HEMOGLOBINOPATHY EVALUATION
HEMOGLOBIN OTHER: 0 %
HGB A: 98.1 % — AB (ref 96.8–97.8)
HGB S QUANTITAION: 0 %
Hgb A2 Quant: 1.9 % — ABNORMAL LOW (ref 2.2–3.2)
Hgb F Quant: 0 % (ref 0.0–2.0)

## 2014-04-06 DIAGNOSIS — M5417 Radiculopathy, lumbosacral region: Secondary | ICD-10-CM | POA: Diagnosis not present

## 2014-04-07 ENCOUNTER — Other Ambulatory Visit: Payer: Self-pay | Admitting: Internal Medicine

## 2014-04-08 NOTE — Telephone Encounter (Signed)
Got yr supply 6/15 to [facility]. Should have RF.

## 2014-04-12 ENCOUNTER — Telehealth: Payer: Self-pay

## 2014-04-12 NOTE — Telephone Encounter (Signed)
-----   Message from Gordy Levan, MD sent at 04/12/2014  9:53 AM EST ----- Labs seen and need follow up: please let her know that these additional tests done because of her anemia did not find any other problems with her hemoglobin or her proteins that would be causing the anemia.

## 2014-04-12 NOTE — Telephone Encounter (Signed)
Told Ms. Gosse the results of the lab work as noted below by Dr. [name]  She will keep appointment 06-24-14 with Dr. [name] as scheduled.

## 2014-04-15 ENCOUNTER — Ambulatory Visit: Payer: Medicare Other | Admitting: Cardiovascular Disease

## 2014-04-19 ENCOUNTER — Other Ambulatory Visit: Payer: Self-pay | Admitting: Internal Medicine

## 2014-04-20 ENCOUNTER — Other Ambulatory Visit: Payer: Self-pay | Admitting: Internal Medicine

## 2014-04-20 NOTE — Telephone Encounter (Signed)
One year's supply sent in June. [facility] has RF.

## 2014-04-22 DIAGNOSIS — M5417 Radiculopathy, lumbosacral region: Secondary | ICD-10-CM | POA: Diagnosis not present

## 2014-04-28 DIAGNOSIS — M5417 Radiculopathy, lumbosacral region: Secondary | ICD-10-CM | POA: Diagnosis not present

## 2014-04-28 DIAGNOSIS — M6281 Muscle weakness (generalized): Secondary | ICD-10-CM | POA: Diagnosis not present

## 2014-04-28 DIAGNOSIS — M2560 Stiffness of unspecified joint, not elsewhere classified: Secondary | ICD-10-CM | POA: Diagnosis not present

## 2014-05-04 DIAGNOSIS — M6281 Muscle weakness (generalized): Secondary | ICD-10-CM | POA: Diagnosis not present

## 2014-05-04 DIAGNOSIS — M2560 Stiffness of unspecified joint, not elsewhere classified: Secondary | ICD-10-CM | POA: Diagnosis not present

## 2014-05-04 DIAGNOSIS — M5417 Radiculopathy, lumbosacral region: Secondary | ICD-10-CM | POA: Diagnosis not present

## 2014-05-06 ENCOUNTER — Encounter: Payer: Self-pay | Admitting: Cardiovascular Disease

## 2014-05-06 ENCOUNTER — Ambulatory Visit (INDEPENDENT_AMBULATORY_CARE_PROVIDER_SITE_OTHER): Payer: Medicare Other | Admitting: Cardiovascular Disease

## 2014-05-06 VITALS — BP 130/62 | HR 77 | Ht 64.0 in | Wt 198.0 lb

## 2014-05-06 DIAGNOSIS — E1165 Type 2 diabetes mellitus with hyperglycemia: Secondary | ICD-10-CM | POA: Diagnosis not present

## 2014-05-06 DIAGNOSIS — E1129 Type 2 diabetes mellitus with other diabetic kidney complication: Secondary | ICD-10-CM

## 2014-05-06 DIAGNOSIS — IMO0002 Reserved for concepts with insufficient information to code with codable children: Secondary | ICD-10-CM

## 2014-05-06 DIAGNOSIS — I10 Essential (primary) hypertension: Secondary | ICD-10-CM | POA: Diagnosis not present

## 2014-05-06 DIAGNOSIS — M5417 Radiculopathy, lumbosacral region: Secondary | ICD-10-CM | POA: Diagnosis not present

## 2014-05-06 DIAGNOSIS — I2510 Atherosclerotic heart disease of native coronary artery without angina pectoris: Secondary | ICD-10-CM | POA: Diagnosis not present

## 2014-05-06 DIAGNOSIS — E785 Hyperlipidemia, unspecified: Secondary | ICD-10-CM | POA: Diagnosis not present

## 2014-05-06 DIAGNOSIS — M2560 Stiffness of unspecified joint, not elsewhere classified: Secondary | ICD-10-CM | POA: Diagnosis not present

## 2014-05-06 DIAGNOSIS — M6281 Muscle weakness (generalized): Secondary | ICD-10-CM | POA: Diagnosis not present

## 2014-05-06 DIAGNOSIS — I2583 Coronary atherosclerosis due to lipid rich plaque: Secondary | ICD-10-CM

## 2014-05-06 NOTE — Assessment & Plan Note (Signed)
Discussed low carb diet.  Target hemoglobin A1c is 6.5 or less.  Continue current medications. Recent poor control from steroid taper

## 2014-05-06 NOTE — Assessment & Plan Note (Signed)
Stable with no angina and good activity level.  Continue medical Rx  

## 2014-05-06 NOTE — Assessment & Plan Note (Signed)
Well controlled.  Continue current medications and low sodium Dash type diet.    

## 2014-05-06 NOTE — Assessment & Plan Note (Signed)
Cholesterol is at goal.  Continue current dose of statin and diet Rx.  No myalgias or side effects.  F/U  LFT's in 6 months. Lab Results  Component Value Date   LDLCALC 65 09/10/2013

## 2014-05-06 NOTE — Progress Notes (Signed)
Patient ID: [name], female   DOB: 09-Jan-1946, 69 y.o.   MRN: [id number] [name] is seen today for F/U of CAD with pevious rotoblator to the LAD in 2000. Her last myovue on 11/2006 showed mild apical thinning and was low risk. She has not had any SSCP and has been compliant with her meds.Her BP is alwyas hard to Rx and she is on multiple drugs with persistant mild tachycardia. She denies dyspnea, PND orthopnea. Has some swelling in her feet. Diet still an issue with her DM. Had a nice trip to the [place] with her daughter last month  Discussed Accelerate Trial with her and she declined. ON lipitor with HDL 39 and LDL 31  Major issue is sciatica seeing [name] on prednisone taper Lymphedema post mastectomy for breast CA seeing [name] and [name]  Going on [place] with family in April    ROS: Denies fever, malais, weight loss, blurry vision, decreased visual acuity, cough, sputum, SOB, hemoptysis, pleuritic pain, palpitaitons, heartburn, abdominal pain, melena, lower extremity edema, claudication, or rash.  All other systems reviewed and negative  General: Affect appropriate Obese black female  HEENT: normal Neck supple with no adenopathy JVP normal no bruits no thyromegaly Lungs clear with no wheezing and good diaphragmatic motion Heart:  S1/S2 SEM  murmur, no rub, gallop or click PMI normal Abdomen: benighn, BS positve, no tenderness, no AAA no bruit.  No HSM or HJR Distal pulses intact with no bruits No edema Neuro non-focal Skin warm and dry No muscular weakness   Current Outpatient Prescriptions  Medication Sig Dispense Refill  . amLODipine (NORVASC) 10 MG tablet Take 1 tablet (10 mg total) by mouth daily. 90 tablet 3  . anastrozole (ARIMIDEX) 1 MG tablet Take 1 tablet (1 mg total) by mouth daily. 30 tablet 3  . aspirin EC 81 MG tablet Take 81 mg by mouth daily.    Marland Kitchen atenolol (TENORMIN) 50 MG tablet Take 50 mg by mouth 2 (two) times daily.    Marland Kitchen atorvastatin (LIPITOR) 20  MG tablet Take 20 mg by mouth at bedtime.    . clopidogrel (PLAVIX) 75 MG tablet TAKE ONE (1) TABLET EACH DAY 90 tablet 3  . cyanocobalamin (,VITAMIN B-12,) 1000 MCG/ML injection INJECT 1 ML INTRAMUSCULARLY EVERY 30 DAYS 10 mL 2  . cyclobenzaprine (FLEXERIL) 5 MG tablet Take 1 tablet (5 mg total) by mouth at bedtime as needed for muscle spasms. 30 tablet 0  . desloratadine (CLARINEX) 5 MG tablet Take 5 mg by mouth daily as needed (allergies).     . fenofibrate (TRICOR) 48 MG tablet Take 48 mg by mouth daily.    . ferrous fumarate (HEMOCYTE - 106 MG FE) 325 (106 FE) MG TABS tablet Take 1 tablet (106 mg of iron total) by mouth daily. 30 each 1  . furosemide (LASIX) 20 MG tablet TAKE ONE TABLET BY MOUTH TWICE DAILY 180 tablet 3  . glucose blood (ONE TOUCH TEST STRIPS) test strip Use to Check Blood Sugars 2-3 Times Daily. Dx Code:E11.29. 100 each 12  . hydrOXYzine (VISTARIL) 50 MG capsule TAKE ONE CAPSULE BY MOUTH AT BEDTIME AS NEEDED 30 capsule 2  . insulin glargine (LANTUS) 100 UNIT/ML injection Inject 0.36 mLs (36 Units total) into the skin at bedtime. 60 mL 3  . insulin lispro (HUMALOG) 100 UNIT/ML injection Inject subcutaneously three times daily with breakfast, lunch and dinner. Take 3 units for meal coverage plus correction for your pre-meal blood sugar as instructed. 10 mL 3  .  lisinopril (PRINIVIL,ZESTRIL) 10 MG tablet Take 10 mg by mouth daily.    Marland Kitchen loperamide (IMODIUM) 2 MG capsule Take 1 capsule (2 mg total) by mouth daily as needed for diarrhea or loose stools. 30 capsule 0  . mometasone (NASONEX) 50 MCG/ACT nasal spray Place 2 sprays into the nose daily.    . nitroGLYCERIN (NITROSTAT) 0.4 MG SL tablet Place 0.4 mg under the tongue every 5 (five) minutes as needed for chest pain.    . potassium chloride (K-DUR) 10 MEQ tablet Take 2 tablets (20 mEq total) by mouth daily. 180 tablet 3  . WELCHOL 625 MG tablet TAKE THREE TABLETS BY MOUTH ONCE DAILY 270 tablet 3   No current  facility-administered medications for this visit.    Allergies  Percocet; Diazepam; Haloperidol lactate; Lorazepam; Morphine sulfate; Propoxyphene hcl; and Tramadol hcl  Electrocardiogram: 2/18  SR RBBB no change from 04/24/13    Assessment and Plan

## 2014-05-06 NOTE — Patient Instructions (Signed)
Your physician wants you to follow-up in:    [place] will receive a reminder letter in the mail two months in advance. If you don't receive a letter, please call our office to schedule the follow-up appointment. Your physician recommends that you continue on your current medications as directed. Please refer to the Current Medication list given to you today.

## 2014-05-10 DIAGNOSIS — M5417 Radiculopathy, lumbosacral region: Secondary | ICD-10-CM | POA: Diagnosis not present

## 2014-05-10 DIAGNOSIS — M2560 Stiffness of unspecified joint, not elsewhere classified: Secondary | ICD-10-CM | POA: Diagnosis not present

## 2014-05-10 DIAGNOSIS — M6281 Muscle weakness (generalized): Secondary | ICD-10-CM | POA: Diagnosis not present

## 2014-05-11 DIAGNOSIS — M6281 Muscle weakness (generalized): Secondary | ICD-10-CM | POA: Diagnosis not present

## 2014-05-11 DIAGNOSIS — M2560 Stiffness of unspecified joint, not elsewhere classified: Secondary | ICD-10-CM | POA: Diagnosis not present

## 2014-05-11 DIAGNOSIS — M5417 Radiculopathy, lumbosacral region: Secondary | ICD-10-CM | POA: Diagnosis not present

## 2014-05-18 DIAGNOSIS — M2560 Stiffness of unspecified joint, not elsewhere classified: Secondary | ICD-10-CM | POA: Diagnosis not present

## 2014-05-18 DIAGNOSIS — M5417 Radiculopathy, lumbosacral region: Secondary | ICD-10-CM | POA: Diagnosis not present

## 2014-05-18 DIAGNOSIS — M6281 Muscle weakness (generalized): Secondary | ICD-10-CM | POA: Diagnosis not present

## 2014-05-20 ENCOUNTER — Ambulatory Visit: Payer: Medicare Other | Admitting: Internal Medicine

## 2014-05-20 DIAGNOSIS — M5417 Radiculopathy, lumbosacral region: Secondary | ICD-10-CM | POA: Diagnosis not present

## 2014-05-22 ENCOUNTER — Other Ambulatory Visit: Payer: Self-pay | Admitting: Internal Medicine

## 2014-05-24 ENCOUNTER — Telehealth: Payer: Self-pay | Admitting: Cardiovascular Disease

## 2014-05-24 ENCOUNTER — Encounter: Payer: Self-pay | Admitting: *Deleted

## 2014-05-24 DIAGNOSIS — M2560 Stiffness of unspecified joint, not elsewhere classified: Secondary | ICD-10-CM | POA: Diagnosis not present

## 2014-05-24 DIAGNOSIS — M6281 Muscle weakness (generalized): Secondary | ICD-10-CM | POA: Diagnosis not present

## 2014-05-24 DIAGNOSIS — M5417 Radiculopathy, lumbosacral region: Secondary | ICD-10-CM | POA: Diagnosis not present

## 2014-05-24 NOTE — Telephone Encounter (Signed)
Her stents are old  [place] to [employer] letter to say MRI ok

## 2014-05-24 NOTE — Telephone Encounter (Signed)
Will verify with Dr Johnsie Cancel then complete letter as requested.

## 2014-05-24 NOTE — Telephone Encounter (Signed)
Left message for pt that letter is ready to be picked up and will be left at the front desk.  Requested she call back if questions or concerns.

## 2014-05-24 NOTE — Telephone Encounter (Signed)
Letter to be written and pt will be called once it is ready for pick up.

## 2014-05-24 NOTE — Telephone Encounter (Signed)
New message      Pt is having a MRI on 05-29-14.  Need letter saying it is ok to have MRI with her stents.  Call when letter is ready

## 2014-05-26 DIAGNOSIS — M2560 Stiffness of unspecified joint, not elsewhere classified: Secondary | ICD-10-CM | POA: Diagnosis not present

## 2014-05-26 DIAGNOSIS — M5417 Radiculopathy, lumbosacral region: Secondary | ICD-10-CM | POA: Diagnosis not present

## 2014-05-26 DIAGNOSIS — M6281 Muscle weakness (generalized): Secondary | ICD-10-CM | POA: Diagnosis not present

## 2014-05-27 ENCOUNTER — Ambulatory Visit (INDEPENDENT_AMBULATORY_CARE_PROVIDER_SITE_OTHER): Payer: Medicare Other | Admitting: Internal Medicine

## 2014-05-27 ENCOUNTER — Encounter: Payer: Self-pay | Admitting: Internal Medicine

## 2014-05-27 VITALS — BP 139/50 | HR 71 | Temp 98.3°F | Resp 20 | Ht 64.0 in | Wt 196.7 lb

## 2014-05-27 DIAGNOSIS — J302 Other seasonal allergic rhinitis: Secondary | ICD-10-CM

## 2014-05-27 DIAGNOSIS — E1129 Type 2 diabetes mellitus with other diabetic kidney complication: Secondary | ICD-10-CM | POA: Diagnosis not present

## 2014-05-27 DIAGNOSIS — G894 Chronic pain syndrome: Secondary | ICD-10-CM

## 2014-05-27 DIAGNOSIS — E1165 Type 2 diabetes mellitus with hyperglycemia: Secondary | ICD-10-CM

## 2014-05-27 DIAGNOSIS — D519 Vitamin B12 deficiency anemia, unspecified: Secondary | ICD-10-CM

## 2014-05-27 DIAGNOSIS — R109 Unspecified abdominal pain: Secondary | ICD-10-CM

## 2014-05-27 DIAGNOSIS — Z7982 Long term (current) use of aspirin: Secondary | ICD-10-CM | POA: Diagnosis not present

## 2014-05-27 DIAGNOSIS — D649 Anemia, unspecified: Secondary | ICD-10-CM | POA: Diagnosis not present

## 2014-05-27 DIAGNOSIS — E1122 Type 2 diabetes mellitus with diabetic chronic kidney disease: Secondary | ICD-10-CM | POA: Diagnosis not present

## 2014-05-27 DIAGNOSIS — Z0000 Encounter for general adult medical examination without abnormal findings: Secondary | ICD-10-CM

## 2014-05-27 DIAGNOSIS — E538 Deficiency of other specified B group vitamins: Secondary | ICD-10-CM

## 2014-05-27 DIAGNOSIS — C50311 Malignant neoplasm of lower-inner quadrant of right female breast: Secondary | ICD-10-CM

## 2014-05-27 DIAGNOSIS — G8929 Other chronic pain: Secondary | ICD-10-CM

## 2014-05-27 DIAGNOSIS — Z794 Long term (current) use of insulin: Secondary | ICD-10-CM

## 2014-05-27 DIAGNOSIS — I2510 Atherosclerotic heart disease of native coronary artery without angina pectoris: Secondary | ICD-10-CM

## 2014-05-27 DIAGNOSIS — I10 Essential (primary) hypertension: Secondary | ICD-10-CM

## 2014-05-27 DIAGNOSIS — IMO0002 Reserved for concepts with insufficient information to code with codable children: Secondary | ICD-10-CM

## 2014-05-27 LAB — POCT GLYCOSYLATED HEMOGLOBIN (HGB A1C): HEMOGLOBIN A1C: 9.7

## 2014-05-27 LAB — GLUCOSE, CAPILLARY: Glucose-Capillary: 160 mg/dL — ABNORMAL HIGH (ref 70–99)

## 2014-05-27 MED ORDER — CYANOCOBALAMIN 1000 MCG/ML IJ SOLN
1000.0000 ug | INTRAMUSCULAR | Status: DC
  Administered 2014-05-27: 1000 ug via SUBCUTANEOUS

## 2014-05-27 MED ORDER — ATORVASTATIN CALCIUM 20 MG PO TABS: 20.0000 mg | ORAL_TABLET | Freq: Every day | ORAL | Status: DC

## 2014-05-27 MED ORDER — FENOFIBRATE 48 MG PO TABS: 48.0000 mg | ORAL_TABLET | Freq: Every day | ORAL | Status: DC

## 2014-05-27 MED ORDER — ZOSTER VACCINE LIVE 19400 UNT/0.65ML ~~LOC~~ SOLR: 0.6500 mL | Freq: Once | SUBCUTANEOUS | Status: DC

## 2014-05-27 MED ORDER — TRAMADOL HCL 50 MG PO TABS: 50.0000 mg | ORAL_TABLET | Freq: Four times a day (QID) | ORAL | Status: AC | PRN

## 2014-05-27 MED ORDER — INSULIN DETEMIR 100 UNIT/ML ~~LOC~~ SOLN: 36.0000 [IU] | Freq: Every day | SUBCUTANEOUS | Status: DC

## 2014-05-27 NOTE — Progress Notes (Signed)
Tramadol Rx called into [facility] on [address] per pt and Dr. Zenovia Jarred request. Spoke to pharmacist named Herbie Baltimore. Yvonna Alanis, RN, 3/101/16, 10:57 AM

## 2014-05-27 NOTE — Patient Instructions (Signed)
1. See me in 3 months 2. [name] will call you early April 3. Wait until after cruise to get [place] vaccine 4. Lantus changed to [place] your cruise

## 2014-05-28 NOTE — Assessment & Plan Note (Signed)
On nasonex and claritin PRN.

## 2014-05-28 NOTE — Assessment & Plan Note (Signed)
She is asymptomatic and is on her ASA, BB, statin, and plavix.

## 2014-05-28 NOTE — Assessment & Plan Note (Signed)
Got B 12 IM today. On FESO4. Heme / Onc W/U for cause and non found other than B12 and iron def and chronic dz.

## 2014-05-28 NOTE — Assessment & Plan Note (Signed)
Uses Imodium PRN for loose stools. I refill as needed.

## 2014-05-28 NOTE — Assessment & Plan Note (Addendum)
I asked her about why she takes hydroxyzine. She stated that she takes it when she lies down and has trouble breathing bc it helps her breath and fall asleep. This breathing issue is new, and she ties it to the timing of prednisone. She endorses DOE but this is old along with a cough. There is no weight change and no increase in LE edema. She denies anxiety. ECHO 4239 grade 1 diastolic dysfunction.  Exam shows clear lungs and only min trace LE edema.   I doubt this is cardiac in origin such as pul edema, systolic dysfxn. It is possible that the steroid had a side effect of slight vol overload and increased anxiety. I have asked her to observe her breathing over the next couple of weeks. If no better, would rec BNP and CXR. Followed possibly by ECHO.  She asked about zoster vaccine. She is not immunosuppressed from malignancy but recently completed a steroid course. Therefore, I gave her an RX but requested that she wait until after cruise to receive it.   Going on cruise mid April.

## 2014-05-28 NOTE — Progress Notes (Signed)
   Subjective:    Patient ID: [name], female    DOB: April 24, 1945, 69 y.o.   MRN: [id number]  HPI  Please see the A&P for the status of the pt's chronic medical problems.  Review of Systems  Constitutional: Negative for unexpected weight change.  HENT: Negative for rhinorrhea and sinus pressure.   Respiratory: Positive for cough and shortness of breath.   Cardiovascular: Negative for chest pain.  Gastrointestinal: Positive for diarrhea.  Musculoskeletal: Positive for back pain and gait problem. Negative for arthralgias.  Psychiatric/Behavioral: Positive for sleep disturbance. The patient is not nervous/anxious.        Objective:   Physical Exam  Constitutional: She is oriented to person, place, and time. She appears well-developed and well-nourished. No distress.  HENT:  Head: Normocephalic and atraumatic.  Right Ear: External ear normal.  Left Ear: External ear normal.  Nose: Nose normal.  Eyes: Conjunctivae and EOM are normal.  Cardiovascular: Normal rate, regular rhythm and normal heart sounds.  Exam reveals no friction rub.   No murmur heard. +2 DP pulses B  Pulmonary/Chest: Effort normal and breath sounds normal. No respiratory distress. She has no wheezes. She has no rales.  Musculoskeletal: Normal range of motion. She exhibits edema.  Min trace edema B LE  Neurological: She is alert and oriented to person, place, and time.  Skin: Skin is warm. She is not diaphoretic.  Psychiatric: She has a normal mood and affect. Her behavior is normal. Judgment and thought content normal.          Assessment & Plan:

## 2014-05-28 NOTE — Assessment & Plan Note (Addendum)
She just finished long steroid course for sciatica. She remains on her Lantus 36 and novolog 3 units + correction prior to her 2 meals. A1C trend 10.9 - 7.6  - 9.7 today. The increase is from her steroid although she had not quite reached goal prior to the steroid. Has not been checking CBG bc knew it was high. Previously, had been checking 2 times daily. At the last visit, with A1C 7.6, my plan was to add pre snacky lunch novolog if her A1C remained above goal. Today, I am not making any changes bc the hyperglcemia is reflective of her steroid. We discussed several options and decided to ask [name] to call her early April to review CBG's over phone and then make changes if needed. We need to keep in mind that she will go on cruise mid April and will not have timely, adequate medical care while away.   Insurance no longer covering lantus. Changed to Levemir.

## 2014-05-28 NOTE — Assessment & Plan Note (Signed)
BP great today.  Lisinopril 10 Lasix 20 BID Norvasc 10 Atenolol 50  Check microalb next time. Decrease norvasc and increase lisinopril??

## 2014-05-28 NOTE — Assessment & Plan Note (Signed)
She completed tx about a yr ago. On arimidex.

## 2014-05-28 NOTE — Assessment & Plan Note (Signed)
Her R shoulder pain is better.  Now with R sciatica being managed by [name]. MRI is sch. Taking tramadol PRN, not often about 6 per week. She has completed the prednisone course without much relief. Pain not interfering with her job. I refilled her tramadol #30 50 mg for a one month supply.

## 2014-05-29 DIAGNOSIS — M47816 Spondylosis without myelopathy or radiculopathy, lumbar region: Secondary | ICD-10-CM | POA: Diagnosis not present

## 2014-05-31 DIAGNOSIS — M2560 Stiffness of unspecified joint, not elsewhere classified: Secondary | ICD-10-CM | POA: Diagnosis not present

## 2014-05-31 DIAGNOSIS — M6281 Muscle weakness (generalized): Secondary | ICD-10-CM | POA: Diagnosis not present

## 2014-05-31 DIAGNOSIS — M5417 Radiculopathy, lumbosacral region: Secondary | ICD-10-CM | POA: Diagnosis not present

## 2014-06-01 DIAGNOSIS — M5417 Radiculopathy, lumbosacral region: Secondary | ICD-10-CM | POA: Diagnosis not present

## 2014-06-04 DIAGNOSIS — M4806 Spinal stenosis, lumbar region: Secondary | ICD-10-CM | POA: Diagnosis not present

## 2014-06-07 ENCOUNTER — Telehealth: Payer: Self-pay | Admitting: *Deleted

## 2014-06-07 DIAGNOSIS — IMO0002 Reserved for concepts with insufficient information to code with codable children: Secondary | ICD-10-CM

## 2014-06-07 DIAGNOSIS — E1165 Type 2 diabetes mellitus with hyperglycemia: Principal | ICD-10-CM

## 2014-06-07 DIAGNOSIS — E1129 Type 2 diabetes mellitus with other diabetic kidney complication: Secondary | ICD-10-CM

## 2014-06-07 DIAGNOSIS — I2510 Atherosclerotic heart disease of native coronary artery without angina pectoris: Secondary | ICD-10-CM

## 2014-06-07 NOTE — Telephone Encounter (Addendum)
I am happy to assist. I checked her insurance formulary and it appears that most insulin brands are covered except novolog, Novolin and apidra. Patient was busy at work and asked CDE to call back. CDE called her insurer: lantus, levemir, humalog and toujeo  vial or pen are all tier 3: 45$/ month at retail pharmacy.  125$  for 3 months mail order at [facility] or 135$ for 90 day supply retail and preferred pharmacy. A possible less expensive option [facility] with either relion R or a rapid acting  Called patient to about her long acting insulin preference: she has enough left for 2 days. she says lantus will cost her 41, levemir 276  And toujeo 127$ without her insurance  Per her pharmacy. She says she thinks it is her yearly deductible that is causing her insulin to cost so much. She is okay doing whichever insulin costs less and will work. i explained that our office had not used too much Toujeo and that she may have to take ReliOn N twice a day and she'll have to mix it. She scheduled an appointment with CDE to review blood sugars for 06/17/14. CDE told her someone would be in touch with her about her long acting insulin. We have Toujeo and lantus copay cards that are not valid when used with Medicare.

## 2014-06-07 NOTE — Telephone Encounter (Signed)
Call from patient stating that [facility] unable to afford with her insurance.  Belives that insurance will cover Toujeo.  Would like to see if she can be switched to this.   Pat uses Paediatric nurse on [address]. Also patient sees Dr. Nicholos Johns  and he would like to give her a Prednisone injection in her back on 06/22/2014.  Wants to know if this is ok.  Can be reached at [phone] .  Sander Nephew, RN 06/07/2014 8:24 AM

## 2014-06-07 NOTE — Telephone Encounter (Signed)
Talked with Debera Lat about pt.

## 2014-06-07 NOTE — Telephone Encounter (Signed)
Talked with pt - sch for first injection 06/22/14. Pt will try to check CBG as outlined next week.

## 2014-06-07 NOTE — Telephone Encounter (Signed)
[name] - would you pls help me know which long acting inuslin her insurance will cover. She had been on lantus but told me last week he rinsurance needed her hanged to levemir so I did. Now insurance will not cover levemir???  Triage - prednisone injection OK if medically indicated but might further raise her CBG. Pls ask her to start (even before the injection) checking her CBG 4 times daily - mix up pre-and post meals - for 3 days and I will ask [name] to call her sooner than early April so that we can get her sugars better prior to the injection.   [name] - instead of calling her early April do you have time later this week?  Thanks

## 2014-06-09 MED ORDER — INSULIN GLARGINE 300 UNIT/ML ~~LOC~~ SOPN: 36.0000 [IU] | PEN_INJECTOR | Freq: Every day | SUBCUTANEOUS | Status: DC

## 2014-06-09 NOTE — Addendum Note (Signed)
Addended by: Larey Dresser A on: 06/09/2014 09:27 AM   Modules accepted: Orders

## 2014-06-09 NOTE — Assessment & Plan Note (Addendum)
Pt's copay too high for insurance for lantus and levemir. [name] investigated and here is her response : "checked her insurance formulary and it appears that most insulin brands are covered except novolog, Novolin and apidra. Patient was busy at work and asked CDE to call back. CDE called her insurer: lantus, levemir, humalog and toujeo  vial or pen are all tier 3: 45$/ month at retail pharmacy.  125$  for 3 months mail order at [facility] or 135$ for 90 day supply retail and preferred pharmacy. A possible less expensive option [facility] with either relion R or a rapid acting  Called patient to about her long acting insulin preference: she has enough left for 2 days. she says lantus will cost her 91, levemir 276  And toujeo 127$ without her insurance  Per her pharmacy. She says she thinks it is her yearly deductible that is causing her insulin to cost so much. She is okay doing whichever insulin costs less and will work. i explained that our office had not used too much Toujeo and that she may have to take ReliOn N twice a day and she'll have to mix it. She scheduled an appointment with CDE to review blood sugars for 06/17/14. CDE told her someone would be in touch with her about her long acting insulin. We have Toujeo and lantus copay cards that are not valid when used with Medicare."   I called pt. She wants Rx for Tuojeo called to pharmacy. Review UpToDate : "Conversion from once-daily Lantus to once-daily Toujeo: Initial dose: May be substituted on an equivalent unit-per-unit basis; however, generally a higher daily dosage of Toujeo will be required to achieve the same level of glycemic control as with Lantus."  Will send in Rx for Toujeo to pharmacy.

## 2014-06-09 NOTE — Assessment & Plan Note (Signed)
She is going to have "back injection" per ortho. I assume this is epidural for sciatica. Advised her to stop her plavix 7 days prior to procedure and to restart the next day. Reviewed recs with Dr Maudie Mercury. Pt repeated instructions back to me.

## 2014-06-17 ENCOUNTER — Encounter: Payer: Self-pay | Admitting: Dietician

## 2014-06-17 ENCOUNTER — Ambulatory Visit (INDEPENDENT_AMBULATORY_CARE_PROVIDER_SITE_OTHER): Payer: Medicare Other | Admitting: Dietician

## 2014-06-17 VITALS — Wt 193.8 lb

## 2014-06-17 DIAGNOSIS — E1165 Type 2 diabetes mellitus with hyperglycemia: Secondary | ICD-10-CM

## 2014-06-17 DIAGNOSIS — IMO0002 Reserved for concepts with insufficient information to code with codable children: Secondary | ICD-10-CM

## 2014-06-17 DIAGNOSIS — E1129 Type 2 diabetes mellitus with other diabetic kidney complication: Secondary | ICD-10-CM

## 2014-06-17 NOTE — Patient Instructions (Addendum)
   To be added to meal time dose of 6 units Humalog no more than three times a day: If CBG is :  < 150 = +0 units Humalog 151-180 = +1 units 181-210 = +2 units 211-240 = +3 units 241-270 = +4 units 271-300 = + 5 units 301-330= +6 units 331-360 =+ 7 units 361-390 = + 8 units 391-420= + 9 units 421-450= +10 units 451-480= 11 units 481- 510= +12 and call office        Take your Humalog 10-20 minutes before meals.   Call us if new correction scale is not working within 2-3 days after your next steroid injection .

## 2014-06-17 NOTE — Progress Notes (Signed)
Medical Nutrition Therapy:  Appt start time: 2409 end time:  1630.  Assessment:  Primary concerns today: Blood sugar control.  patient has been struggling with cost of her insulin and was out of lantus  for ~ 1 week prior to visit and also has been on steroids for ~ 2 months, both causing increased blood sugars. She has now been taking Toujeo for two days and her fasting blood sugar today was 157. She takes her humalog ~ 30 minutes after eating and before bed. She denies any symptoms of hypoglycemia.  Her goal blood sugars are 100-250, possibly to 100 but has not had to lower so this scares her.  The Toujeo insulin cost her 261.00 this month, but will be 45$ next month.  Meter (flock was set on 2008) download shows average of 305 for past 30 days with 28 readings. (This does not coincide with her taking her Humalog twice per day and using the correction scale day as she had indicated in our discussions.  Usual eating pattern includes 2 meals and 0-1 snacks per day..   Usual physical activity includes works on her feet 24 hour recall not done today Learning Readiness: Ready & Change in progress Barriers to learning/adherence to lifestyle change: cost of medicine and time as patient still works Recalculated her correction factor based on her report of taking 36 units long acting and ~ 24 units humalog/day. TDD 60/1800 = 30- Weight stable.  Lab Results  Component Value Date   HGBA1C 9.7 05/27/2014  , patient reports never having a low blood sugar ever, Progress Towards Goal(s):  In progress.   Nutritional Diagnosis:  NB-1.1 Food and nutrition-related knowledge deficit As related to lack of prior sufficient diabetes and nutrition training.  As evidenced by her report and A1C > her goal of 7%..    Intervention:  Nutrition education about how to use correction insulin and timing of insulin with meals. Coordination of care: patient's correction insulin was adjusted using new TDD per approval of Dr.  Lynnae January Handouts given during visit include:AVS  Demonstrated degree of understanding via:  Teach Back   Monitoring/Evaluation:  Dietary intake, exercise, meter, and body weight in 2 week(s) by phone 2-3 months in office. Marland Kitchen

## 2014-06-18 ENCOUNTER — Other Ambulatory Visit: Payer: Self-pay | Admitting: Internal Medicine

## 2014-06-18 DIAGNOSIS — IMO0002 Reserved for concepts with insufficient information to code with codable children: Secondary | ICD-10-CM

## 2014-06-18 DIAGNOSIS — E1165 Type 2 diabetes mellitus with hyperglycemia: Principal | ICD-10-CM

## 2014-06-18 DIAGNOSIS — E1129 Type 2 diabetes mellitus with other diabetic kidney complication: Secondary | ICD-10-CM

## 2014-06-19 ENCOUNTER — Other Ambulatory Visit: Payer: Self-pay | Admitting: Oncology

## 2014-06-19 DIAGNOSIS — C50311 Malignant neoplasm of lower-inner quadrant of right female breast: Secondary | ICD-10-CM

## 2014-06-21 ENCOUNTER — Telehealth: Payer: Self-pay | Admitting: Dietician

## 2014-06-21 ENCOUNTER — Telehealth: Payer: Self-pay | Admitting: Oncology

## 2014-06-21 NOTE — Telephone Encounter (Signed)
pt cld to r/s appt-gave pt updated appt she req in May

## 2014-06-21 NOTE — Telephone Encounter (Signed)
Called patient's insurer about the cost difference between the Humalog pens and vials. Mailed her information per her request and she agreed to call at her convenience with questions. She has 3 stages of prescription coverage: stage 1 her cost is the same for both, stage 2 the pens are considerable more expensive, stage 3.her cost drops again and the pens are still more expensive

## 2014-06-22 DIAGNOSIS — M4806 Spinal stenosis, lumbar region: Secondary | ICD-10-CM | POA: Diagnosis not present

## 2014-06-24 ENCOUNTER — Other Ambulatory Visit: Payer: Medicare Other

## 2014-06-24 ENCOUNTER — Ambulatory Visit: Payer: Medicare Other | Admitting: Oncology

## 2014-06-29 ENCOUNTER — Telehealth: Payer: Self-pay | Admitting: Dietician

## 2014-06-29 NOTE — Telephone Encounter (Signed)
Left message offering blood sugar management support as needed.

## 2014-06-30 ENCOUNTER — Telehealth: Payer: Self-pay | Admitting: Internal Medicine

## 2014-06-30 NOTE — Telephone Encounter (Signed)
Call to patient to confirm appointment for 07/01/14 at 10:00 lmtcb

## 2014-07-01 ENCOUNTER — Ambulatory Visit (INDEPENDENT_AMBULATORY_CARE_PROVIDER_SITE_OTHER): Payer: Medicare Other | Admitting: *Deleted

## 2014-07-01 DIAGNOSIS — D519 Vitamin B12 deficiency anemia, unspecified: Secondary | ICD-10-CM | POA: Diagnosis not present

## 2014-07-01 MED ORDER — CYANOCOBALAMIN 1000 MCG/ML IJ SOLN
1000.0000 ug | Freq: Once | INTRAMUSCULAR | Status: AC
  Administered 2014-07-01: 1000 ug via INTRAMUSCULAR

## 2014-07-18 HISTORY — PX: BREAST BIOPSY: SHX20

## 2014-07-19 ENCOUNTER — Other Ambulatory Visit: Payer: Self-pay | Admitting: *Deleted

## 2014-07-19 DIAGNOSIS — I2510 Atherosclerotic heart disease of native coronary artery without angina pectoris: Secondary | ICD-10-CM

## 2014-07-19 DIAGNOSIS — C50311 Malignant neoplasm of lower-inner quadrant of right female breast: Secondary | ICD-10-CM

## 2014-07-19 DIAGNOSIS — E1129 Type 2 diabetes mellitus with other diabetic kidney complication: Secondary | ICD-10-CM

## 2014-07-19 DIAGNOSIS — IMO0002 Reserved for concepts with insufficient information to code with codable children: Secondary | ICD-10-CM

## 2014-07-19 DIAGNOSIS — I10 Essential (primary) hypertension: Secondary | ICD-10-CM

## 2014-07-19 DIAGNOSIS — E1122 Type 2 diabetes mellitus with diabetic chronic kidney disease: Secondary | ICD-10-CM

## 2014-07-19 DIAGNOSIS — M4806 Spinal stenosis, lumbar region: Secondary | ICD-10-CM | POA: Diagnosis not present

## 2014-07-19 DIAGNOSIS — E1165 Type 2 diabetes mellitus with hyperglycemia: Secondary | ICD-10-CM

## 2014-07-19 MED ORDER — POTASSIUM CHLORIDE ER 10 MEQ PO TBCR: 20.0000 meq | EXTENDED_RELEASE_TABLET | Freq: Every day | ORAL | Status: DC

## 2014-07-19 NOTE — Telephone Encounter (Signed)
Welchol stopped 2/2 cost per notes and started on fenofibrate.  Arimedex Rx'd by onc.  HAs refills on other 2.  I refilled KCl since that was going to run out soon.

## 2014-07-19 NOTE — Telephone Encounter (Signed)
Pt request new Rx be sent to Mail delivery.

## 2014-07-20 MED ORDER — FENOFIBRATE 48 MG PO TABS: 48.0000 mg | ORAL_TABLET | Freq: Every day | ORAL | Status: DC

## 2014-07-20 MED ORDER — AMLODIPINE BESYLATE 10 MG PO TABS: 10.0000 mg | ORAL_TABLET | Freq: Every day | ORAL | Status: DC

## 2014-07-20 NOTE — Telephone Encounter (Signed)
Refilled norvasc and fenofibrate. She needs to ask Onc for Arimedex KCl sent yesterday Welchol stopped

## 2014-07-20 NOTE — Addendum Note (Signed)
Addended by: Larey Dresser A on: 07/20/2014 06:48 AM   Modules accepted: Orders

## 2014-07-21 ENCOUNTER — Other Ambulatory Visit: Payer: Self-pay | Admitting: Oncology

## 2014-07-22 ENCOUNTER — Telehealth: Payer: Self-pay | Admitting: Oncology

## 2014-07-22 ENCOUNTER — Ambulatory Visit (HOSPITAL_BASED_OUTPATIENT_CLINIC_OR_DEPARTMENT_OTHER): Payer: Medicare Other | Admitting: Oncology

## 2014-07-22 ENCOUNTER — Other Ambulatory Visit: Payer: Self-pay | Admitting: Oncology

## 2014-07-22 ENCOUNTER — Other Ambulatory Visit (HOSPITAL_BASED_OUTPATIENT_CLINIC_OR_DEPARTMENT_OTHER): Payer: Medicare Other

## 2014-07-22 ENCOUNTER — Encounter: Payer: Self-pay | Admitting: Oncology

## 2014-07-22 VITALS — BP 146/58 | HR 76 | Temp 97.9°F | Resp 18 | Ht 64.0 in | Wt 197.9 lb

## 2014-07-22 DIAGNOSIS — Z170 Estrogen receptor positive status [ER+]: Secondary | ICD-10-CM

## 2014-07-22 DIAGNOSIS — I890 Lymphedema, not elsewhere classified: Secondary | ICD-10-CM

## 2014-07-22 DIAGNOSIS — N189 Chronic kidney disease, unspecified: Secondary | ICD-10-CM

## 2014-07-22 DIAGNOSIS — C50311 Malignant neoplasm of lower-inner quadrant of right female breast: Secondary | ICD-10-CM

## 2014-07-22 DIAGNOSIS — Z79811 Long term (current) use of aromatase inhibitors: Secondary | ICD-10-CM | POA: Diagnosis not present

## 2014-07-22 DIAGNOSIS — N6459 Other signs and symptoms in breast: Secondary | ICD-10-CM | POA: Diagnosis not present

## 2014-07-22 DIAGNOSIS — Z79899 Other long term (current) drug therapy: Secondary | ICD-10-CM

## 2014-07-22 DIAGNOSIS — D631 Anemia in chronic kidney disease: Secondary | ICD-10-CM

## 2014-07-22 LAB — CBC WITH DIFFERENTIAL/PLATELET
BASO%: 0.2 % (ref 0.0–2.0)
Basophils Absolute: 0 10*3/uL (ref 0.0–0.1)
EOS%: 1.8 % (ref 0.0–7.0)
Eosinophils Absolute: 0.1 10*3/uL (ref 0.0–0.5)
HEMATOCRIT: 30.6 % — AB (ref 34.8–46.6)
HGB: 9.8 g/dL — ABNORMAL LOW (ref 11.6–15.9)
LYMPH%: 22.2 % (ref 14.0–49.7)
MCH: 26.3 pg (ref 25.1–34.0)
MCHC: 32 g/dL (ref 31.5–36.0)
MCV: 82 fL (ref 79.5–101.0)
MONO#: 0.4 10*3/uL (ref 0.1–0.9)
MONO%: 6.6 % (ref 0.0–14.0)
NEUT%: 69.2 % (ref 38.4–76.8)
NEUTROS ABS: 3.8 10*3/uL (ref 1.5–6.5)
PLATELETS: 288 10*3/uL (ref 145–400)
RBC: 3.73 10*6/uL (ref 3.70–5.45)
RDW: 14.6 % — AB (ref 11.2–14.5)
WBC: 5.5 10*3/uL (ref 3.9–10.3)
lymph#: 1.2 10*3/uL (ref 0.9–3.3)

## 2014-07-22 LAB — COMPREHENSIVE METABOLIC PANEL (CC13)
ALBUMIN: 3.7 g/dL (ref 3.5–5.0)
ALK PHOS: 81 U/L (ref 40–150)
ALT: 15 U/L (ref 0–55)
AST: 15 U/L (ref 5–34)
Anion Gap: 15 mEq/L — ABNORMAL HIGH (ref 3–11)
BUN: 29.9 mg/dL — ABNORMAL HIGH (ref 7.0–26.0)
CO2: 22 mEq/L (ref 22–29)
Calcium: 9.6 mg/dL (ref 8.4–10.4)
Chloride: 104 mEq/L (ref 98–109)
Creatinine: 2 mg/dL — ABNORMAL HIGH (ref 0.6–1.1)
EGFR: 30 mL/min/{1.73_m2} — ABNORMAL LOW (ref >90)
Glucose: 193 mg/dl — ABNORMAL HIGH (ref 70–140)
POTASSIUM: 4.1 meq/L (ref 3.5–5.1)
SODIUM: 142 meq/L (ref 136–145)
TOTAL PROTEIN: 8 g/dL (ref 6.4–8.3)
Total Bilirubin: 0.2 mg/dL (ref 0.20–1.20)

## 2014-07-22 NOTE — Progress Notes (Signed)
OFFICE PROGRESS NOTE   Jul 22, 2014   Physicians:(Khan, Kalsoom); Larey Dresser; [place], Robert; [place]; [place], [place], Honor Junes, J/ Mina Marble  INTERVAL HISTORY:  Patient is seen, alone for visit, in follow up of stage I invasive ductal carcinoma of right breast, on adjuvant Arimidex since 07-2013. She had bilateral diagnostic mammograms at [facility] 02-15-2014 with scattered fibroglandular density and no mammographic findings of concern; last bone density scan was at [facility] 08-26-13, normal in femur and forearm but diffuse sclerosis in LS where she has had previous orthopedic surgery such that no measurements done LS. Patient has felt thickened area laterally in right breast. She has had improvement in lymphedema right breast with lymphedema massage ~ 3-4 x weekly. She is to see Dr [name] next in June.  She is still very symptomatic in low back from arthritis and spinal stenosis, which she tells me were apparent on MRI by [name]. She had injection in back by Dr Mina Marble 2 weeks ago, without improvement, and is for another injection upcoming. She also had several weeks of prednisone previously, without improvement.  She has seen no bleeding and is not obviously more symptomatic from anemia which was present at least back to 2012. SPEP 03-2014 had no M spike and hemoglobin electrophoresis 03-2014 was normal. Erythropoietin level was elevated at 37.8. Urinalysis 02-2014 was negative for blood. Iron and ferritin were at least low normal in 07-2013 and B12 high in 06-2012.   ONCOLOGIC HISTORY #1Screening mammogram on 02/06/2013.possibly showed a mass in the right breast; 02/27/2013 diagnostic mammogram and ultrasound showed 6 x 5 mm mass at 5:00 position. Lymph nodes were not enlarged. She did not have an MRI. She underwent a biopsy on 03/16/2013 this revealed invasive ductal carcinoma, low grade. Tumor was ER positive PR positive HER-2/neu negative with a proliferation marker Ki-67 of 20%.   #2 s/p lumpectomy with sentinel lymph node biopsy on 05/12/13 with final pathology revealing:  Grade: II of III  Tumor size (gross measurement: 0.9 cm  Lymph nodes: 0/6  Breast prognostic profile:  Estrogen receptor: Not repeated, previous study demonstrated 100% positivity  Progesterone receptor: Not repeated, previous study demonstrated 12% positivity  Her 2 neu: Repeated, previous study demonstrated no amplification (1.79)  #3 Oncotype Dx testing score: Her Oncotype score was 19 giving her a 12% risk of distant recurrence with tamoxifen only for 5 years. She does not want chemotherapy.  #4.Radiation given 06-02-13 thru 07-16-13 4500 Gy to right breast with boost 1600 Gy. #5 began Arimidex 08-06-2013      Review of systems as above, also: No fever or symptoms of infection. Blood sugars higher with recent steroids. Remainder of 10 point Review of Systems negative.  Objective:  Vital signs in last 24 hours:  BP 146/58 mmHg  Pulse 76  Temp(Src) 97.9 F (36.6 C) (Oral)  Resp 18  Ht 5' 4"  (1.626 m)  Wt 197 lb 14.4 oz (89.767 kg)  BMI 33.95 kg/m2 weight is up 9 lbs  Alert, oriented and appropriate. Ambulatory without assistance.   HEENT:PERRL, sclerae not icteric. Oral mucosa moist without lesions, posterior pharynx clear.  Neck supple. No JVD.  Lymphatics:no cervical,supraclavicular, axillary or inguinal adenopathy Resp: clear to auscultation bilaterally and normal percussion bilaterally Cardio: regular rate and rhythm. No gallop. GI: soft, nontender, not distended, no mass or organomegaly. Normally active bowel sounds.  Musculoskeletal/ Extremities: without pitting edema, cords, tenderness PSYCH appropriate mood and affect Skin without rash, ecchymosis, petechiae Breasts:  Right with linear  thickening inferolaterally and thickening at areolar edge at 3:00 which is ~ 2 cm diameter, physical exam suggests fat necrosis or keloid; lymphedema changes less obvious, no  other findings of concern. Left breast without dominant mass, skin or nipple findings. Axillae benign.   Lab Results:  Results for orders placed or performed in visit on 07/22/14  CBC with Differential  Result Value Ref Range   WBC 5.5 3.9 - 10.3 10e3/uL   NEUT# 3.8 1.5 - 6.5 10e3/uL   HGB 9.8 (L) 11.6 - 15.9 g/dL   HCT 30.6 (L) 34.8 - 46.6 %   Platelets 288 145 - 400 10e3/uL   MCV 82.0 79.5 - 101.0 fL   MCH 26.3 25.1 - 34.0 pg   MCHC 32.0 31.5 - 36.0 g/dL   RBC 3.73 3.70 - 5.45 10e6/uL   RDW 14.6 (H) 11.2 - 14.5 %   lymph# 1.2 0.9 - 3.3 10e3/uL   MONO# 0.4 0.1 - 0.9 10e3/uL   Eosinophils Absolute 0.1 0.0 - 0.5 10e3/uL   Basophils Absolute 0.0 0.0 - 0.1 10e3/uL   NEUT% 69.2 38.4 - 76.8 %   LYMPH% 22.2 14.0 - 49.7 %   MONO% 6.6 0.0 - 14.0 %   EOS% 1.8 0.0 - 7.0 %   BASO% 0.2 0.0 - 2.0 %  Comprehensive metabolic panel (Cmet) - [facility]  Result Value Ref Range   Sodium 142 136 - 145 mEq/L   Potassium 4.1 3.5 - 5.1 mEq/L   Chloride 104 98 - 109 mEq/L   CO2 22 22 - 29 mEq/L   Glucose 193 (H) 70 - 140 mg/dl   BUN 29.9 (H) 7.0 - 26.0 mg/dL   Creatinine 2.0 (H) 0.6 - 1.1 mg/dL   Total Bilirubin 0.20 0.20 - 1.20 mg/dL   Alkaline Phosphatase 81 40 - 150 U/L   AST 15 5 - 34 U/L   ALT 15 0 - 55 U/L   Total Protein 8.0 6.4 - 8.3 g/dL   Albumin 3.7 3.5 - 5.0 g/dL   Calcium 9.6 8.4 - 10.4 mg/dL   Anion Gap 15 (H) 3 - 11 mEq/L   EGFR 30 (L) >90 ml/min/1.73 m2   Hemoglobin a little improved over last value  Studies/Results:  No results found. She has not had bone marrow exam  Medications: I have reviewed the patient's current medications.  DISCUSSION: probable progressive scarring or some fat necorsis at surgical sites right breast, but will repeat diagnostic right mammogram prior to upcoming visit to Dr [name], and expect will need bilateral on schedule in early Dec also. Continue arimidex. WIll need bone density scan in 08-2015.  Assessment/Plan: 1. T1N0 invasive ductal carcinoma  with DCIS of right breast: post lumpectomy and 6 sentinel nodes 04-22-2013, ER and PR +, HER 2 negative, local radiation and on arimidex since 07-2013 which she will continue. Lymphedema PT for right breast helpful. Some changes on exam possibly scarring or fat necrosis, will get right mammogram prior to upcoming apt with Dr [name].  I will see her at least in 4 months, with labs. 2.anemia: back at least to 2012. Appears likely from CKD and other chronic disease by evaluation as noted above (no bone marrow exam) and seems asymptomatic at least with somewhat limited activity level 3.low back, right hip and RLE pain: per patient, degenerative arthritis and spinal stenosis. Orthopedics involved. 4.sclerotic changes in lumbar spine on DEXA, in region of apparent laminectomy.  5.diabetes on insulin  6.chronic renal insufficiency  7.. CAD, HTN: followed  by Dr Johnsie Cancel  8..post right colectomy (or ileocolectomy). SBO 08-2013 thought from adhesions, resolved with bowel rest. 9..flu vaccine done   All questions answered and patient is in agreement with plans above. CC this note to Drs [name] and Linde Gillis, MD   07/22/2014, 2:55 PM

## 2014-07-22 NOTE — Telephone Encounter (Signed)
Gave avs & calendar for May/September.

## 2014-07-24 ENCOUNTER — Other Ambulatory Visit: Payer: Self-pay | Admitting: Internal Medicine

## 2014-07-24 ENCOUNTER — Other Ambulatory Visit: Payer: Self-pay | Admitting: Oncology

## 2014-07-26 ENCOUNTER — Other Ambulatory Visit: Payer: Self-pay

## 2014-07-26 ENCOUNTER — Other Ambulatory Visit: Payer: Self-pay | Admitting: Oncology

## 2014-07-26 DIAGNOSIS — C50311 Malignant neoplasm of lower-inner quadrant of right female breast: Secondary | ICD-10-CM

## 2014-07-27 ENCOUNTER — Other Ambulatory Visit: Payer: Self-pay | Admitting: Oncology

## 2014-07-27 ENCOUNTER — Ambulatory Visit
Admission: RE | Admit: 2014-07-27 | Discharge: 2014-07-27 | Disposition: A | Discharge status: Home / Self Care | Payer: Medicare Other | Source: Ambulatory Visit | Attending: Oncology | Admitting: Oncology

## 2014-07-27 DIAGNOSIS — C50311 Malignant neoplasm of lower-inner quadrant of right female breast: Secondary | ICD-10-CM

## 2014-07-27 DIAGNOSIS — N63 Unspecified lump in breast: Secondary | ICD-10-CM | POA: Diagnosis not present

## 2014-07-27 DIAGNOSIS — Z853 Personal history of malignant neoplasm of breast: Secondary | ICD-10-CM | POA: Diagnosis not present

## 2014-07-27 DIAGNOSIS — N6310 Unspecified lump in the right breast, unspecified quadrant: Secondary | ICD-10-CM

## 2014-07-29 ENCOUNTER — Ambulatory Visit: Payer: Medicare Other

## 2014-07-29 ENCOUNTER — Other Ambulatory Visit: Payer: Self-pay | Admitting: Oncology

## 2014-07-29 DIAGNOSIS — N6310 Unspecified lump in the right breast, unspecified quadrant: Secondary | ICD-10-CM

## 2014-08-04 ENCOUNTER — Ambulatory Visit
Admission: RE | Admit: 2014-08-04 | Discharge: 2014-08-04 | Disposition: A | Discharge status: Home / Self Care | Payer: Medicare Other | Source: Ambulatory Visit | Attending: Oncology | Admitting: Oncology

## 2014-08-04 DIAGNOSIS — N63 Unspecified lump in breast: Secondary | ICD-10-CM | POA: Diagnosis not present

## 2014-08-04 DIAGNOSIS — N6310 Unspecified lump in the right breast, unspecified quadrant: Secondary | ICD-10-CM

## 2014-08-04 DIAGNOSIS — N641 Fat necrosis of breast: Secondary | ICD-10-CM | POA: Diagnosis not present

## 2014-08-10 DIAGNOSIS — M4806 Spinal stenosis, lumbar region: Secondary | ICD-10-CM | POA: Diagnosis not present

## 2014-08-11 ENCOUNTER — Other Ambulatory Visit: Payer: Self-pay | Admitting: *Deleted

## 2014-08-11 DIAGNOSIS — E1122 Type 2 diabetes mellitus with diabetic chronic kidney disease: Secondary | ICD-10-CM

## 2014-08-11 DIAGNOSIS — IMO0002 Reserved for concepts with insufficient information to code with codable children: Secondary | ICD-10-CM

## 2014-08-11 DIAGNOSIS — I2510 Atherosclerotic heart disease of native coronary artery without angina pectoris: Secondary | ICD-10-CM

## 2014-08-11 DIAGNOSIS — E1129 Type 2 diabetes mellitus with other diabetic kidney complication: Secondary | ICD-10-CM

## 2014-08-11 DIAGNOSIS — E1165 Type 2 diabetes mellitus with hyperglycemia: Secondary | ICD-10-CM

## 2014-08-13 ENCOUNTER — Other Ambulatory Visit: Payer: Self-pay | Admitting: Internal Medicine

## 2014-08-13 DIAGNOSIS — E1122 Type 2 diabetes mellitus with diabetic chronic kidney disease: Secondary | ICD-10-CM

## 2014-08-13 DIAGNOSIS — E119 Type 2 diabetes mellitus without complications: Secondary | ICD-10-CM

## 2014-08-13 MED ORDER — CLOPIDOGREL BISULFATE 75 MG PO TABS: ORAL_TABLET | ORAL | Status: DC

## 2014-08-13 MED ORDER — NITROGLYCERIN 0.4 MG SL SUBL: 0.4000 mg | SUBLINGUAL_TABLET | SUBLINGUAL | Status: DC | PRN

## 2014-08-13 MED ORDER — POTASSIUM CHLORIDE ER 10 MEQ PO TBCR: 20.0000 meq | EXTENDED_RELEASE_TABLET | Freq: Every day | ORAL | Status: DC

## 2014-08-13 MED ORDER — MOMETASONE FUROATE 50 MCG/ACT NA SUSP: 2.0000 | Freq: Every day | NASAL | Status: DC

## 2014-08-13 MED ORDER — ATENOLOL 50 MG PO TABS: 50.0000 mg | ORAL_TABLET | Freq: Two times a day (BID) | ORAL | Status: DC

## 2014-08-13 MED ORDER — ATORVASTATIN CALCIUM 20 MG PO TABS: 20.0000 mg | ORAL_TABLET | Freq: Every day | ORAL | Status: DC

## 2014-08-13 MED ORDER — LISINOPRIL 10 MG PO TABS: 10.0000 mg | ORAL_TABLET | Freq: Every day | ORAL | Status: DC

## 2014-08-13 MED ORDER — INSULIN DETEMIR 100 UNIT/ML ~~LOC~~ SOLN: 36.0000 [IU] | Freq: Every day | SUBCUTANEOUS | Status: DC

## 2014-08-13 MED ORDER — FUROSEMIDE 20 MG PO TABS: 20.0000 mg | ORAL_TABLET | Freq: Two times a day (BID) | ORAL | Status: DC

## 2014-08-13 MED ORDER — GLUCOSE BLOOD VI STRP: ORAL_STRIP | Status: DC

## 2014-08-13 MED ORDER — INSULIN LISPRO 100 UNIT/ML ~~LOC~~ SOLN: SUBCUTANEOUS | Status: DC

## 2014-08-13 NOTE — Telephone Encounter (Signed)
I need to verify insulins bc I had her down as on Levemir and whether she uses pens or vials.

## 2014-08-18 ENCOUNTER — Other Ambulatory Visit: Payer: Self-pay | Admitting: *Deleted

## 2014-08-18 DIAGNOSIS — C50311 Malignant neoplasm of lower-inner quadrant of right female breast: Secondary | ICD-10-CM

## 2014-08-18 MED ORDER — ANASTROZOLE 1 MG PO TABS: 1.0000 mg | ORAL_TABLET | Freq: Every day | ORAL | Status: DC

## 2014-08-18 MED ORDER — FERROUS FUMARATE 325 (106 FE) MG PO TABS: 1.0000 | ORAL_TABLET | Freq: Every day | ORAL | Status: DC

## 2014-08-26 ENCOUNTER — Ambulatory Visit (INDEPENDENT_AMBULATORY_CARE_PROVIDER_SITE_OTHER): Payer: Medicare Other | Admitting: *Deleted

## 2014-08-26 DIAGNOSIS — D518 Other vitamin B12 deficiency anemias: Secondary | ICD-10-CM

## 2014-08-26 MED ORDER — CYANOCOBALAMIN 1000 MCG/ML IJ SOLN
1000.0000 ug | Freq: Once | INTRAMUSCULAR | Status: AC
  Administered 2014-08-26: 1000 ug via INTRAMUSCULAR

## 2014-09-06 LAB — HM DIABETES EYE EXAM

## 2014-09-07 LAB — HM DIABETES EYE EXAM

## 2014-09-09 ENCOUNTER — Other Ambulatory Visit: Payer: Self-pay | Admitting: *Deleted

## 2014-09-09 NOTE — Telephone Encounter (Signed)
Yr's supply ordered in Nov

## 2014-09-10 MED ORDER — COLESEVELAM HCL 625 MG PO TABS: ORAL_TABLET | ORAL | Status: DC

## 2014-09-10 NOTE — Telephone Encounter (Signed)
I am sorry but pt is changing to Mail order pharmacy and Rx needs to be sent to them.  Can not transfer from local store. Old Rx was with Vladimir Faster.

## 2014-09-10 NOTE — Addendum Note (Signed)
Addended by: Gevena Cotton A on: 09/10/2014 12:23 PM   Modules accepted: Orders

## 2014-09-15 ENCOUNTER — Encounter: Payer: Self-pay | Admitting: *Deleted

## 2014-09-16 ENCOUNTER — Ambulatory Visit (INDEPENDENT_AMBULATORY_CARE_PROVIDER_SITE_OTHER): Payer: Medicare Other | Admitting: Dietician

## 2014-09-16 ENCOUNTER — Other Ambulatory Visit: Payer: Self-pay | Admitting: Internal Medicine

## 2014-09-16 ENCOUNTER — Encounter: Payer: Self-pay | Admitting: Dietician

## 2014-09-16 VITALS — Wt 197.3 lb

## 2014-09-16 DIAGNOSIS — E1165 Type 2 diabetes mellitus with hyperglycemia: Secondary | ICD-10-CM

## 2014-09-16 DIAGNOSIS — Z794 Long term (current) use of insulin: Secondary | ICD-10-CM

## 2014-09-16 DIAGNOSIS — IMO0002 Reserved for concepts with insufficient information to code with codable children: Secondary | ICD-10-CM

## 2014-09-16 DIAGNOSIS — Z713 Dietary counseling and surveillance: Secondary | ICD-10-CM

## 2014-09-16 DIAGNOSIS — E1129 Type 2 diabetes mellitus with other diabetic kidney complication: Secondary | ICD-10-CM

## 2014-09-16 LAB — GLUCOSE, CAPILLARY: Glucose-Capillary: 105 mg/dL — ABNORMAL HIGH (ref 65–99)

## 2014-09-16 LAB — POCT GLYCOSYLATED HEMOGLOBIN (HGB A1C): HEMOGLOBIN A1C: 9.5

## 2014-09-16 NOTE — Patient Instructions (Signed)
IF Dr. Lynnae January orders the repaglinide,  Do not take the Humalog and the repaglinide/Prandin at the same tine.   Stop the Humalog when you start the repaglinide  Carry your meter and food with you when you start the repaglinide. And check more often ) like 3 times a day and before bed to see how it is affecting your blood sugars)   If too high (200s) then call us if too low (< 80-90) call us.

## 2014-09-16 NOTE — Progress Notes (Signed)
Medical Nutrition Therapy:  Appt start time: 1330 end time:  1430.  Assessment:  Primary concerns today: Blood sugar control.  Patient is here to work on her diabetes. Her A1c has come down minimally to 9.5% from 9.7% despite covering increase in blood sugars when on steroids with correction insulin in April and being on both basal and prandial. She never mises her levemir, but after discussing possible reasons for extreme variations in fasting blood sugars, she admits to taking the Humalog when she checks her blood sugar which is < 1x/day. She also reports taking the Humalog in the morning today 2 hours before breakfast. She sometimes does not take Humalog at all in the evening. She is using a vial due to cost whic is less convenient and does not take it with her when she leaves the house and does not take it when she eats late because it is " too close to the levemir and she is scared to take them that close together".It is difficult to quantify how often she is taking the Humalog, but suspect 0-1 times a day. She feels she would be better at taking pills than injections with meals. (Humalog 45$/vail and levemir  135$ for 3 vials)  Weight: stable in 190s.  Her Goals: blood sugars 100-250, possibly to 100 but has not had  lower so this scares her.   Blood Sugars: Meter download shows average of 259 for 30 days and 277 for 90 days, 24 readings in 30 days and 64 readings in 90 days.range is 156-383, most readings are fasting and have large variability, she checked 2x later in day both were in 300s, today 2 hours after lunch her CBG was 105- suspect because she had taken the Humalog this am.  Usual eating pattern includes 2 meals and 0-1 snacks per day. She does not eat  Consistent meals or at consistent times of day.Skips breakfast often, eats out.  Usual physical activity includes works on her feet and goes to gym for 2 hours 4 nights a week  Learning Readiness: Ready & Change in progress Barriers to  learning/adherence to lifestyle change: cost of medicine and time as patient still works  Weight stable.  Lab Results  Component Value Date   HGBA1C 9.5 09/16/2014   Progress Towards Goal(s):  In progress.   Nutritional Diagnosis:  NB-1.1 Food and nutrition-related knowledge deficit As related to lack of prior sufficient diabetes and nutrition training.  As evidenced by her report and A1C > her goal of 7%..    Intervention:  Commended her for her exercise, taking basal insulin and fasting blood sugar consistently. Nutrition education about options to reduce her blood sugars: Educated her about VGO and different medications and action of Humalog, cost of medications on her plan.  Coordination of care: she is due to see Dr. Lynnae January now, suggest change from Humalog to prandin 2 mg TID before meals  Handouts given during visit include:AVS  Demonstrated degree of understanding via:  Teach Back   Monitoring/Evaluation:  Dietary intake, exercise, meter, and body weight in 2 week(s) by phone after starting new medicine if approved and 2-3 months in office. Marland Kitchen

## 2014-09-17 ENCOUNTER — Other Ambulatory Visit: Payer: Self-pay | Admitting: Internal Medicine

## 2014-09-17 ENCOUNTER — Telehealth: Payer: Self-pay | Admitting: Dietician

## 2014-09-17 DIAGNOSIS — E1129 Type 2 diabetes mellitus with other diabetic kidney complication: Secondary | ICD-10-CM

## 2014-09-17 DIAGNOSIS — E1165 Type 2 diabetes mellitus with hyperglycemia: Principal | ICD-10-CM

## 2014-09-17 DIAGNOSIS — IMO0002 Reserved for concepts with insufficient information to code with codable children: Secondary | ICD-10-CM

## 2014-09-17 MED ORDER — REPAGLINIDE 2 MG PO TABS: 2.0000 mg | ORAL_TABLET | Freq: Three times a day (TID) | ORAL | Status: DC

## 2014-09-17 NOTE — Assessment & Plan Note (Addendum)
[name] had meet with pt and got that she was not taking her humalog for many reasons - insurance would only cover vial, didn't take it with her when going out to eat, woulnd't take it at night when ate dinner late, etc. A1C too high. Ok to stop humalog and start prandin - might be easier for her to comply. Start prandin 2 mg prior to meals and titrate up dose based on CBG's. Needs appt with me.

## 2014-09-17 NOTE — Telephone Encounter (Signed)
Called patient about starting prandin and stopping Humalog before meals. She verbalized understanding. CDE also informed her that she is due to see Dr. Lynnae January.

## 2014-09-21 ENCOUNTER — Telehealth: Payer: Self-pay | Admitting: *Deleted

## 2014-09-21 NOTE — Telephone Encounter (Signed)
Call from patient's daughter about patient not sleeping or eating well.  Would like to have patient put on meds for.  Call to patient stated that she will try Vit B Complex  and a Mulitivitamin first .  Message to be sent to Dr. Lynnae January.  Sander Nephew, RN 09/22/2014 3:38 PM

## 2014-09-27 ENCOUNTER — Ambulatory Visit (INDEPENDENT_AMBULATORY_CARE_PROVIDER_SITE_OTHER): Payer: Medicare Other | Admitting: *Deleted

## 2014-09-27 DIAGNOSIS — D518 Other vitamin B12 deficiency anemias: Secondary | ICD-10-CM | POA: Diagnosis not present

## 2014-09-27 MED ORDER — CYANOCOBALAMIN 1000 MCG/ML IJ SOLN
1000.0000 ug | Freq: Once | INTRAMUSCULAR | Status: AC
  Administered 2014-09-27: 1000 ug via INTRAMUSCULAR

## 2014-10-25 ENCOUNTER — Ambulatory Visit (INDEPENDENT_AMBULATORY_CARE_PROVIDER_SITE_OTHER): Payer: Medicare Other | Admitting: *Deleted

## 2014-10-25 DIAGNOSIS — D518 Other vitamin B12 deficiency anemias: Secondary | ICD-10-CM | POA: Diagnosis not present

## 2014-10-25 MED ORDER — CYANOCOBALAMIN 1000 MCG/ML IJ SOLN
1000.0000 ug | Freq: Once | INTRAMUSCULAR | Status: AC
  Administered 2014-10-25: 1000 ug via INTRAMUSCULAR

## 2014-10-28 ENCOUNTER — Encounter: Payer: Self-pay | Admitting: Internal Medicine

## 2014-10-28 ENCOUNTER — Ambulatory Visit (INDEPENDENT_AMBULATORY_CARE_PROVIDER_SITE_OTHER): Payer: Medicare Other | Admitting: Internal Medicine

## 2014-10-28 VITALS — BP 156/64 | HR 64 | Temp 98.1°F | Ht 64.0 in | Wt 194.0 lb

## 2014-10-28 DIAGNOSIS — Z0000 Encounter for general adult medical examination without abnormal findings: Secondary | ICD-10-CM

## 2014-10-28 DIAGNOSIS — I129 Hypertensive chronic kidney disease with stage 1 through stage 4 chronic kidney disease, or unspecified chronic kidney disease: Secondary | ICD-10-CM | POA: Diagnosis not present

## 2014-10-28 DIAGNOSIS — Z7902 Long term (current) use of antithrombotics/antiplatelets: Secondary | ICD-10-CM

## 2014-10-28 DIAGNOSIS — N183 Chronic kidney disease, stage 3 unspecified: Secondary | ICD-10-CM

## 2014-10-28 DIAGNOSIS — E1165 Type 2 diabetes mellitus with hyperglycemia: Secondary | ICD-10-CM | POA: Diagnosis not present

## 2014-10-28 DIAGNOSIS — Z23 Encounter for immunization: Secondary | ICD-10-CM | POA: Diagnosis not present

## 2014-10-28 DIAGNOSIS — E1129 Type 2 diabetes mellitus with other diabetic kidney complication: Secondary | ICD-10-CM

## 2014-10-28 DIAGNOSIS — I10 Essential (primary) hypertension: Secondary | ICD-10-CM

## 2014-10-28 DIAGNOSIS — E1122 Type 2 diabetes mellitus with diabetic chronic kidney disease: Secondary | ICD-10-CM

## 2014-10-28 DIAGNOSIS — IMO0002 Reserved for concepts with insufficient information to code with codable children: Secondary | ICD-10-CM

## 2014-10-28 DIAGNOSIS — Z794 Long term (current) use of insulin: Secondary | ICD-10-CM

## 2014-10-28 DIAGNOSIS — Z8673 Personal history of transient ischemic attack (TIA), and cerebral infarction without residual deficits: Secondary | ICD-10-CM

## 2014-10-28 DIAGNOSIS — G8929 Other chronic pain: Secondary | ICD-10-CM

## 2014-10-28 DIAGNOSIS — M5431 Sciatica, right side: Secondary | ICD-10-CM

## 2014-10-28 LAB — GLUCOSE, CAPILLARY: Glucose-Capillary: 230 mg/dL — ABNORMAL HIGH (ref 65–99)

## 2014-10-28 MED ORDER — LISINOPRIL 10 MG PO TABS: 20.0000 mg | ORAL_TABLET | Freq: Every day | ORAL | Status: DC

## 2014-10-28 NOTE — Patient Instructions (Addendum)
1. Try melatonin 0.3 mg for sleep, If no better in 2 weeks, pls call me 2. I 2will send you your labs 3. See me in October 4. Cont to work with [name] on sugar control 5. Increase your lisinopril to 20 mg a day

## 2014-10-28 NOTE — Progress Notes (Signed)
   Subjective:    Patient ID: [name], female    DOB: June 23, 1945, 69 y.o.   MRN: [id number]  HPI  [name] is here for DM F/U. Please see the A&P for the status of the pt's chronic medical problems.   Review of Systems  Constitutional: Positive for appetite change and fatigue.  Respiratory: Negative for shortness of breath.   Cardiovascular: Negative for chest pain.  Musculoskeletal: Positive for back pain.  Skin:       Bump on lip  Neurological: Negative for headaches.  Psychiatric/Behavioral: Positive for sleep disturbance.       No RLS sxs, no reported apnea, no snoring       Objective:   Physical Exam  Constitutional: She appears well-developed and well-nourished. No distress.  HENT:  Head: Normocephalic and atraumatic.  Right Ear: External ear normal.  Left Ear: External ear normal.  Nose: Nose normal.  Eyes: Conjunctivae and EOM are normal.  Cardiovascular: Normal rate.   2/6 sys murmur R heart border  Pulmonary/Chest: Effort normal and breath sounds normal.  Musculoskeletal: Normal range of motion.  Trace edema B  Neurological: She is alert.  Skin: Skin is warm and dry. She is not diaphoretic.  Psychiatric: She has a normal mood and affect. Her behavior is normal. Thought content normal.          Assessment & Plan:

## 2014-10-29 ENCOUNTER — Encounter: Payer: Self-pay | Admitting: Gastroenterology

## 2014-10-29 LAB — LIPID PANEL
CHOLESTEROL TOTAL: 162 mg/dL (ref 100–199)
Chol/HDL Ratio: 4.1 ratio units (ref 0.0–4.4)
HDL: 40 mg/dL (ref >39)
LDL CALC: 79 mg/dL (ref 0–99)
TRIGLYCERIDES: 217 mg/dL — AB (ref 0–149)
VLDL CHOLESTEROL CAL: 43 mg/dL — AB (ref 5–40)

## 2014-10-29 LAB — BASIC METABOLIC PANEL
BUN/Creatinine Ratio: 22 (ref 11–26)
BUN: 34 mg/dL — ABNORMAL HIGH (ref 8–27)
CO2: 21 mmol/L (ref 18–29)
Calcium: 9.5 mg/dL (ref 8.7–10.3)
Chloride: 102 mmol/L (ref 97–108)
Creatinine, Ser: 1.57 mg/dL — ABNORMAL HIGH (ref 0.57–1.00)
GFR calc non Af Amer: 34 mL/min/{1.73_m2} — ABNORMAL LOW (ref >59)
GFR, EST AFRICAN AMERICAN: 39 mL/min/{1.73_m2} — AB (ref >59)
Glucose: 180 mg/dL — ABNORMAL HIGH (ref 65–99)
POTASSIUM: 4.6 mmol/L (ref 3.5–5.2)
SODIUM: 142 mmol/L (ref 134–144)

## 2014-10-29 LAB — MICROALBUMIN / CREATININE URINE RATIO
CREATININE, UR: 99 mg/dL
MICROALB/CREAT RATIO: 33.4 mg/g{creat} — AB (ref 0.0–30.0)
MICROALBUM., U, RANDOM: 33.1 ug/mL

## 2014-10-29 NOTE — Assessment & Plan Note (Signed)
Last A1C was 9.5, Her regimen is levemir 36 and prandin 2 TID. She checks her SBG mostly in AM (every AM). Her lowest was 69 and she woke up feeling funny. Average AM about 150's., lunch 240+, dinner 150's, and night 150's. Her variability seems to be decreasing. I am hesitent to increase her levemir 2/2 hypoglycemia and her prandin is at max. She is exercising and I hope that will improve insulin resistance.   Lab Results  Component Value Date   HGBA1C 9.5 09/16/2014

## 2014-10-29 NOTE — Assessment & Plan Note (Addendum)
Her daughter had been worried about her not eating enough. She states she thinks she is eating enough but does state her appetite is not great. She has intentional weigh loss of a few pounds.   She is also concerned about her sleep. Lays down around 9-10 PM. Sleeps at 11 PM. Wakes up after 1 hour and takes 45 min to fall back asleep. Repeats throughout night. Urinates only once. Tired upon awakening. Sluggish. No naps during day. No RLS / OSA sxs. Trial of melatonin.  Microalbumin, Lipid, and PNA 23 today.

## 2014-10-29 NOTE — Assessment & Plan Note (Signed)
BP Readings from Last 3 Encounters:  10/28/14 156/64  07/22/14 146/58  05/27/14 139/50   On lisinopril 10, lasix 20 BID, Norvasc 10, and atenolol 5. BP not controlled. Increased lisinopril o 20. Will need to watch Cr as slight bump. Baseline seems to be about 1.2 to 1.3. F/U 2 months.  Lab Results  Component Value Date   CREATININE 1.57* 10/28/2014

## 2014-10-29 NOTE — Assessment & Plan Note (Signed)
R sciatica is better and rarely uses tramadol.

## 2014-10-29 NOTE — Assessment & Plan Note (Signed)
She is on anti-plt.

## 2014-10-29 NOTE — Assessment & Plan Note (Signed)
Since I am increasing ACE, I will need to check BMP next appt.

## 2014-11-01 ENCOUNTER — Encounter: Payer: Self-pay | Admitting: Internal Medicine

## 2014-11-14 ENCOUNTER — Other Ambulatory Visit: Payer: Self-pay | Admitting: Oncology

## 2014-11-14 DIAGNOSIS — C50311 Malignant neoplasm of lower-inner quadrant of right female breast: Secondary | ICD-10-CM

## 2014-11-16 ENCOUNTER — Telehealth: Payer: Self-pay | Admitting: Oncology

## 2014-11-16 ENCOUNTER — Other Ambulatory Visit: Payer: Self-pay | Admitting: Oncology

## 2014-11-16 NOTE — Telephone Encounter (Signed)
Spoke with patient and she is aware of her new schedule per dr [name] due to chemo patient

## 2014-11-18 ENCOUNTER — Ambulatory Visit: Payer: Medicare Other | Admitting: Oncology

## 2014-11-18 ENCOUNTER — Other Ambulatory Visit: Payer: Medicare Other

## 2014-11-25 ENCOUNTER — Ambulatory Visit (INDEPENDENT_AMBULATORY_CARE_PROVIDER_SITE_OTHER): Payer: Medicare Other | Admitting: *Deleted

## 2014-11-25 DIAGNOSIS — D510 Vitamin B12 deficiency anemia due to intrinsic factor deficiency: Secondary | ICD-10-CM

## 2014-11-25 MED ORDER — CYANOCOBALAMIN 1000 MCG/ML IJ SOLN
1000.0000 ug | Freq: Once | INTRAMUSCULAR | Status: AC
  Administered 2014-11-25: 1000 ug via INTRAMUSCULAR

## 2014-12-04 ENCOUNTER — Other Ambulatory Visit: Payer: Self-pay | Admitting: Oncology

## 2014-12-06 ENCOUNTER — Other Ambulatory Visit (HOSPITAL_BASED_OUTPATIENT_CLINIC_OR_DEPARTMENT_OTHER): Payer: Medicare Other

## 2014-12-06 ENCOUNTER — Encounter: Payer: Self-pay | Admitting: Oncology

## 2014-12-06 ENCOUNTER — Telehealth: Payer: Self-pay | Admitting: Oncology

## 2014-12-06 ENCOUNTER — Ambulatory Visit (HOSPITAL_BASED_OUTPATIENT_CLINIC_OR_DEPARTMENT_OTHER): Payer: Medicare Other | Admitting: Oncology

## 2014-12-06 VITALS — BP 138/65 | HR 70 | Temp 98.5°F | Resp 18 | Ht 64.0 in | Wt 192.0 lb

## 2014-12-06 DIAGNOSIS — Z79899 Other long term (current) drug therapy: Secondary | ICD-10-CM

## 2014-12-06 DIAGNOSIS — N641 Fat necrosis of breast: Secondary | ICD-10-CM

## 2014-12-06 DIAGNOSIS — C50311 Malignant neoplasm of lower-inner quadrant of right female breast: Secondary | ICD-10-CM

## 2014-12-06 DIAGNOSIS — I890 Lymphedema, not elsewhere classified: Secondary | ICD-10-CM

## 2014-12-06 DIAGNOSIS — D509 Iron deficiency anemia, unspecified: Secondary | ICD-10-CM

## 2014-12-06 LAB — COMPREHENSIVE METABOLIC PANEL (CC13)
ALBUMIN: 3.7 g/dL (ref 3.5–5.0)
ALK PHOS: 60 U/L (ref 40–150)
ALT: 20 U/L (ref 0–55)
AST: 17 U/L (ref 5–34)
Anion Gap: 7 mEq/L (ref 3–11)
BUN: 32.9 mg/dL — AB (ref 7.0–26.0)
CO2: 25 mEq/L (ref 22–29)
CREATININE: 1.7 mg/dL — AB (ref 0.6–1.1)
Calcium: 9.8 mg/dL (ref 8.4–10.4)
Chloride: 109 mEq/L (ref 98–109)
EGFR: 34 mL/min/{1.73_m2} — ABNORMAL LOW (ref >90)
Glucose: 149 mg/dl — ABNORMAL HIGH (ref 70–140)
Potassium: 4.4 mEq/L (ref 3.5–5.1)
Sodium: 141 mEq/L (ref 136–145)
Total Bilirubin: <0.2 mg/dL (ref 0.20–1.20)
Total Protein: 8 g/dL (ref 6.4–8.3)

## 2014-12-06 LAB — CBC WITH DIFFERENTIAL/PLATELET
BASO%: 0.5 % (ref 0.0–2.0)
BASOS ABS: 0 10*3/uL (ref 0.0–0.1)
EOS%: 1.4 % (ref 0.0–7.0)
Eosinophils Absolute: 0.1 10*3/uL (ref 0.0–0.5)
HEMATOCRIT: 29 % — AB (ref 34.8–46.6)
HEMOGLOBIN: 9.6 g/dL — AB (ref 11.6–15.9)
LYMPH#: 1.5 10*3/uL (ref 0.9–3.3)
LYMPH%: 17.7 % (ref 14.0–49.7)
MCH: 26.4 pg (ref 25.1–34.0)
MCHC: 33 g/dL (ref 31.5–36.0)
MCV: 80 fL (ref 79.5–101.0)
MONO#: 0.4 10*3/uL (ref 0.1–0.9)
MONO%: 5.3 % (ref 0.0–14.0)
NEUT#: 6.2 10*3/uL (ref 1.5–6.5)
NEUT%: 75.1 % (ref 38.4–76.8)
Platelets: 295 10*3/uL (ref 145–400)
RBC: 3.62 10*6/uL — ABNORMAL LOW (ref 3.70–5.45)
RDW: 15 % — AB (ref 11.2–14.5)
WBC: 8.3 10*3/uL (ref 3.9–10.3)

## 2014-12-06 MED ORDER — FERROUS FUMARATE 325 (106 FE) MG PO TABS: 1.0000 | ORAL_TABLET | Freq: Every day | ORAL | Status: DC

## 2014-12-06 MED ORDER — ANASTROZOLE 1 MG PO TABS: 1.0000 mg | ORAL_TABLET | Freq: Every day | ORAL | Status: DC

## 2014-12-06 NOTE — Progress Notes (Signed)
OFFICE PROGRESS NOTE   December 06, 2014   Physicians:Khan, Kalsoom); Larey Dresser; [place], [place]; [place]; [place], [place], Honor Junes, Lenna Sciara Mina Marble  INTERVAL HISTORY:  Patient is seen, alone for visit, in scheduled follow up of adjuvant Arimidex begun 07-2013 for stage I invasive ductal carcinoma of right breast. She is due bilateral mammograms at [facility] ~ 02-17-15, last bilateral on 02-15-14 and right mammogram/ US/ biopsy (fat necrosis) in 07-2014. Last Dexa was at [facility] 08-26-13 and planned again 08-2015 as this had normal density in femur and forearm. Patient continues lymphedema massage to right breast herself, previously seen by lymphedema PT.   Patient denies any new or different problems other than "stiffness" RLE and some numbness right foot since injection to low back by orhtopedics ~ July. The injection may have helped low back pain a little. RLE stiffness improves with activity, no swelling, not worsening. No arthralgias hands or feet. Otherwise she reports energy and appetite at fairly good baseline. She denies bleeding, increased SOB, change in bowels. She denies changes in either breast.   No central catheter No genetics testing.  She will have flu vaccine elsewhere prior to end of Oct.  ONCOLOGIC HISTORY Screening mammogram on 02/06/2013.possibe mass in the right breast; 02/27/2013 diagnostic mammogram and ultrasound showed 6 x 5 mm mass at 5:00 position. Lymph nodes were not enlarged. Biopsy 03/16/2013 invasive ductal carcinoma, low grade, ER positive PR positive HER-2/neu negative with a proliferation marker Ki-67 of 20%.  Lumpectomy with sentinel lymph node biopsy on 05/12/13 with final pathology revealing:  Grade: II of III  Tumor size (gross measurement: 0.9 cm  Lymph nodes: 0/6  Breast prognostic profile:  Estrogen receptor: Not repeated, previous study demonstrated 100% positivity  Progesterone receptor: Not repeated, previous study  demonstrated 12% positivity  Her 2 neu: Repeated, previous study demonstrated no amplification (1.79)  Oncotype Dx testing score: Her Oncotype score was 19 giving her a 12% risk of distant recurrence with tamoxifen only for 5 years. She does not want chemotherapy.  Radiation given 06-02-13 thru 07-16-13 4500 Gy to right breast with boost 1600 Gy. Began Arimidex 08-06-2013  Anemia: present at least back to 2012. SPEP 03-2014  no M spike and hemoglobin electrophoresis 03-2014 was normal. Erythropoietin level was elevated at 37.8. Urinalysis 02-2014 was negative for blood. Iron and ferritin were at least low normal in 07-2013 and B12 high in 06-2012.  Review of systems as above, also: No recent infectious illness. No chest pain.  Remainder of 10 point Review of Systems negative.  Objective:  Vital signs in last 24 hours:  BP 138/65 mmHg  Pulse 70  Temp(Src) 98.5 F (36.9 C) (Oral)  Resp 18  Ht 5' 4"  (1.626 m)  Wt 192 lb (87.091 kg)  BMI 32.94 kg/m2  SpO2 100% Weight down ~ 6 lbs from May. Alert, oriented and appropriate. Ambulatory slowly but without assistance.Marland Kitchen   HEENT:PERRL, sclerae not icteric. Oral mucosa moist without lesions, posterior pharynx clear.  Neck supple. No JVD.  Lymphatics:no cervical,supraclavicular, axillary or inguinal adenopathy Resp: clear to auscultation bilaterally and normal percussion bilaterally Cardio: regular rate and rhythm. No gallop. GI: abdomen soft, nontender, not distended, no mass or organomegaly. Normally active bowel sounds.  Musculoskeletal/ Extremities: without pitting edema, cords, tenderness. Laminectomy scar lumbar spine, no point tenderness there.  Neuro decreased sensation light touch lateral right thigh. Psych appropriate mood and affect Skin without rash, ecchymosis, petechiae Breasts: RIght with hyperpigmentation and lymphedema mostly inferiorly and medially without dominant  mass, skin or nipple findings. Left breast without dominant  mass, skin or nipple findings. Axillae benign.  Lab Results:  Results for orders placed or performed in visit on 12/06/14  CBC with Differential  Result Value Ref Range   WBC 8.3 3.9 - 10.3 10e3/uL   NEUT# 6.2 1.5 - 6.5 10e3/uL   HGB 9.6 (L) 11.6 - 15.9 g/dL   HCT 29.0 (L) 34.8 - 46.6 %   Platelets 295 145 - 400 10e3/uL   MCV 80.0 79.5 - 101.0 fL   MCH 26.4 25.1 - 34.0 pg   MCHC 33.0 31.5 - 36.0 g/dL   RBC 3.62 (L) 3.70 - 5.45 10e6/uL   RDW 15.0 (H) 11.2 - 14.5 %   lymph# 1.5 0.9 - 3.3 10e3/uL   MONO# 0.4 0.1 - 0.9 10e3/uL   Eosinophils Absolute 0.1 0.0 - 0.5 10e3/uL   Basophils Absolute 0.0 0.0 - 0.1 10e3/uL   NEUT% 75.1 38.4 - 76.8 %   LYMPH% 17.7 14.0 - 49.7 %   MONO% 5.3 0.0 - 14.0 %   EOS% 1.4 0.0 - 7.0 %   BASO% 0.5 0.0 - 2.0 %  Comprehensive metabolic panel (Cmet) - [facility]  Result Value Ref Range   Sodium 141 136 - 145 mEq/L   Potassium 4.4 3.5 - 5.1 mEq/L   Chloride 109 98 - 109 mEq/L   CO2 25 22 - 29 mEq/L   Glucose 149 (H) 70 - 140 mg/dl   BUN 32.9 (H) 7.0 - 26.0 mg/dL   Creatinine 1.7 (H) 0.6 - 1.1 mg/dL   Total Bilirubin <0.20 0.20 - 1.20 mg/dL   Alkaline Phosphatase 60 40 - 150 U/L   AST 17 5 - 34 U/L   ALT 20 0 - 55 U/L   Total Protein 8.0 6.4 - 8.3 g/dL   Albumin 3.7 3.5 - 5.0 g/dL   Calcium 9.8 8.4 - 10.4 mg/dL   Anion Gap 7 3 - 11 mEq/L   EGFR 34 (L) >90 ml/min/1.73 m2     Studies/Results:  No results found.  Medications: I have reviewed the patient's current medications. Contnue Arimidex. On ferrous fumarate. She receives B12 injections monthly at internal medicine clinic  DISCUSSION: RLE symptoms likely from known spinal stenosis and LS problems, not suggestive of arthralgias from aromatase inhibitors. Timing of mammograms and bone density scan discussed. Encouraged lymphedema massage as instructed.  Assessment/Plan: 1. T1N0 invasive ductal carcinoma with DCIS of right breast: post lumpectomy and 6 sentinel nodes 04-22-2013, ER and PR +, HER 2  negative, local radiation and on arimidex since 07-2013 which she will continue. Lymphedema PT for right breast helpful. Fat necrosis by biopsy right breast 07-2014. Bilateral mammograms ordered for ~ 02-17-15.  2.anemia: back at least to 2012. Appears likely from CKD and other chronic disease by evaluation as noted above and seems asymptomatic at least with somewhat limited activity level. Other counts ok 3.low back, right hip and RLE pain/ stiffness/ decreased sensation: per patient, degenerative arthritis and spinal stenosis. Orthopedics involved. 4.sclerotic changes in lumbar spine on DEXA, in region of laminectomy.  5.diabetes on insulin managed by PCP 6.chronic renal insufficiency , CKD III 7.. CAD, HTN: followed by Dr Johnsie Cancel  8..post right colectomy (or ileocolectomy). SBO 08-2013 thought from adhesions, resolved with bowel rest. 9..flu vaccine to be done elsewhere before end of Oct, possibly with Dr Zenovia Jarred apt 01-06-15.   All questions answered. Time spent 25 min including >50% counseling and coordination of care   LIVESAY,LENNIS P,  MD   12/06/2014, 3:46 PM

## 2014-12-06 NOTE — Telephone Encounter (Signed)
Appointments made and avs printed for patient °

## 2014-12-20 ENCOUNTER — Other Ambulatory Visit: Payer: Self-pay | Admitting: Internal Medicine

## 2014-12-23 ENCOUNTER — Other Ambulatory Visit: Payer: Self-pay | Admitting: Internal Medicine

## 2014-12-23 ENCOUNTER — Ambulatory Visit: Payer: Medicare Other

## 2015-01-06 ENCOUNTER — Ambulatory Visit (INDEPENDENT_AMBULATORY_CARE_PROVIDER_SITE_OTHER): Payer: Medicare Other | Admitting: Internal Medicine

## 2015-01-06 ENCOUNTER — Encounter: Payer: Self-pay | Admitting: Internal Medicine

## 2015-01-06 ENCOUNTER — Other Ambulatory Visit: Payer: Self-pay | Admitting: Pharmacist

## 2015-01-06 VITALS — BP 139/52 | HR 67 | Temp 98.2°F | Wt 194.6 lb

## 2015-01-06 DIAGNOSIS — E1142 Type 2 diabetes mellitus with diabetic polyneuropathy: Secondary | ICD-10-CM

## 2015-01-06 DIAGNOSIS — E784 Other hyperlipidemia: Secondary | ICD-10-CM

## 2015-01-06 DIAGNOSIS — IMO0002 Reserved for concepts with insufficient information to code with codable children: Secondary | ICD-10-CM

## 2015-01-06 DIAGNOSIS — I129 Hypertensive chronic kidney disease with stage 1 through stage 4 chronic kidney disease, or unspecified chronic kidney disease: Secondary | ICD-10-CM | POA: Diagnosis not present

## 2015-01-06 DIAGNOSIS — N183 Chronic kidney disease, stage 3 (moderate): Secondary | ICD-10-CM | POA: Diagnosis not present

## 2015-01-06 DIAGNOSIS — E538 Deficiency of other specified B group vitamins: Secondary | ICD-10-CM

## 2015-01-06 DIAGNOSIS — I10 Essential (primary) hypertension: Secondary | ICD-10-CM

## 2015-01-06 DIAGNOSIS — Z7984 Long term (current) use of oral hypoglycemic drugs: Secondary | ICD-10-CM

## 2015-01-06 DIAGNOSIS — Z0000 Encounter for general adult medical examination without abnormal findings: Secondary | ICD-10-CM

## 2015-01-06 DIAGNOSIS — F1920 Other psychoactive substance dependence, uncomplicated: Secondary | ICD-10-CM

## 2015-01-06 DIAGNOSIS — I2510 Atherosclerotic heart disease of native coronary artery without angina pectoris: Secondary | ICD-10-CM

## 2015-01-06 DIAGNOSIS — Z23 Encounter for immunization: Secondary | ICD-10-CM | POA: Diagnosis not present

## 2015-01-06 DIAGNOSIS — E1140 Type 2 diabetes mellitus with diabetic neuropathy, unspecified: Secondary | ICD-10-CM | POA: Diagnosis not present

## 2015-01-06 DIAGNOSIS — E1169 Type 2 diabetes mellitus with other specified complication: Secondary | ICD-10-CM | POA: Diagnosis not present

## 2015-01-06 DIAGNOSIS — E785 Hyperlipidemia, unspecified: Secondary | ICD-10-CM

## 2015-01-06 DIAGNOSIS — J302 Other seasonal allergic rhinitis: Secondary | ICD-10-CM

## 2015-01-06 DIAGNOSIS — E1122 Type 2 diabetes mellitus with diabetic chronic kidney disease: Secondary | ICD-10-CM

## 2015-01-06 DIAGNOSIS — Z794 Long term (current) use of insulin: Secondary | ICD-10-CM | POA: Diagnosis not present

## 2015-01-06 DIAGNOSIS — E1165 Type 2 diabetes mellitus with hyperglycemia: Secondary | ICD-10-CM

## 2015-01-06 LAB — GLUCOSE, CAPILLARY: Glucose-Capillary: 162 mg/dL — ABNORMAL HIGH (ref 65–99)

## 2015-01-06 LAB — POCT GLYCOSYLATED HEMOGLOBIN (HGB A1C): HEMOGLOBIN A1C: 7.5

## 2015-01-06 MED ORDER — GABAPENTIN 300 MG PO CAPS: 300.0000 mg | ORAL_CAPSULE | Freq: Every day | ORAL | Status: DC

## 2015-01-06 MED ORDER — CYANOCOBALAMIN 1000 MCG/ML IJ SOLN
1000.0000 ug | Freq: Once | INTRAMUSCULAR | Status: AC
  Administered 2015-01-06: 1000 ug via INTRAMUSCULAR

## 2015-01-06 NOTE — Assessment & Plan Note (Signed)
Asymptomatic. Remains on dual anti-plt. BB and statin. RF well managed. Cont current meds

## 2015-01-06 NOTE — Assessment & Plan Note (Signed)
BP Readings from Last 3 Encounters:  01/06/15 139/52  12/06/14 138/65  10/28/14 156/64   Well controlled on her lisinopril 20 (dose had been increased last appt), lasix 20 BID, norvasc 10, and atenolol 5. No side effects and tolerating well. Cont current meds. Check BMP.

## 2015-01-06 NOTE — Assessment & Plan Note (Signed)
Her Cr has increased from 1.57 to 1.7. Before that, I had increased lisinopril from 10 to 20. We reviewed her Cr curve. I am checking Cr again today.

## 2015-01-06 NOTE — Assessment & Plan Note (Signed)
She is on clarinex daily and nasonex QHS. The nasonex helps at night but sxs during day. The drug is tier 3 so the cheapest but trouble paying for it esp since I rec that she do 1 spray each nostril BID (checked dosing and OK). Her allergies are not yr round but seasonal. I asked Dr Maudie Mercury to review to see if any coupons as I did not find any on needymeds.

## 2015-01-06 NOTE — Assessment & Plan Note (Signed)
Flu shot Hep C testing B12 IM

## 2015-01-06 NOTE — Assessment & Plan Note (Signed)
See DM

## 2015-01-06 NOTE — Patient Instructions (Signed)
1. I will send you your test results 2. Keep up the good work exercising and getting your sugar under control 3. Try the gabapentin for burning leg pain as needed. If that is not effective, may go to nightly

## 2015-01-06 NOTE — Progress Notes (Signed)
   Subjective:    Patient ID: [name], female    DOB: [birth year], 69 y.o.   MRN: [id number]  HPI  [name] is here for DM F/U. Please see the A&P for the status of the pt's chronic medical problems.  Review of Systems  Constitutional: Positive for fatigue. Negative for unexpected weight change.  HENT: Positive for rhinorrhea and sneezing.   Eyes: Positive for discharge and itching.  Respiratory: Negative for cough and shortness of breath.   Cardiovascular: Positive for leg swelling. Negative for chest pain.  Endocrine:       No hypoglycemic sxs  Musculoskeletal: Negative for gait problem.       R leg pain / sciatica although better  Neurological: Negative for weakness and headaches.  Psychiatric/Behavioral: Positive for sleep disturbance. Negative for dysphoric mood.       Objective:   Physical Exam  Constitutional: She is oriented to person, place, and time. She appears well-developed and well-nourished. No distress.  HENT:  Head: Normocephalic and atraumatic.  Right Ear: External ear normal.  Left Ear: External ear normal.  Nose: Nose normal.  Mouth/Throat: Oropharynx is clear and moist.  dentures  Eyes: Conjunctivae and EOM are normal.  Cardiovascular: Normal rate, regular rhythm and normal heart sounds.   Pulmonary/Chest: Effort normal and breath sounds normal.  Musculoskeletal: Normal range of motion. She exhibits edema.  Trace B  Neurological: She is alert and oriented to person, place, and time.  Skin: Skin is warm and dry. She is not diaphoretic.  Psychiatric: She has a normal mood and affect. Her behavior is normal. Judgment and thought content normal.          Assessment & Plan:

## 2015-01-06 NOTE — Assessment & Plan Note (Signed)
She remains on her lipitor and last LDL was 79 2 months ago. No side effects. Cont med.

## 2015-01-06 NOTE — Assessment & Plan Note (Addendum)
Last A1C was 9.5. I did not adjust her meds bc she started exercising and had a few lows. She is exercising and taking her Levemir 36 and prandin 2 BID before meals and her A1C is 7.5. She has no lows. She is checking every AM and average in AM is 150. I did not change meds bc she is still exercising and just added a 4th day so her A1C should cont to decrease even more.   She has had one month of nighttime neuropathy sxs several times a week. This is hot, painful, burning sensation in her legs. It wakes her up and she can't sleep. It eases off over about 3 hrs. She doesn't describe any RLS sxs. I explained that the recent decline in her DM control might have triggered this and that now with better control, her sxs might resolve. However the sxs are freq and severe enough that she elected tx. I explained that gabapentin, or any other tx, really isn't a PRN med but she is hesitant to start a QHS med. Therefore, I am OK starting Gaba 300 QHS PRN and see if ir helps. If not, she can start QHS. If this doesn't work or if her sleep gets worse, would change to Elavil QHS.

## 2015-01-07 ENCOUNTER — Encounter: Payer: Self-pay | Admitting: Internal Medicine

## 2015-01-07 LAB — BMP8+ANION GAP
Anion Gap: 19 mmol/L — ABNORMAL HIGH (ref 10.0–18.0)
BUN / CREAT RATIO: 21 (ref 11–26)
BUN: 34 mg/dL — ABNORMAL HIGH (ref 8–27)
CO2: 22 mmol/L (ref 18–29)
CREATININE: 1.6 mg/dL — AB (ref 0.57–1.00)
Calcium: 9.6 mg/dL (ref 8.7–10.3)
Chloride: 102 mmol/L (ref 97–106)
GFR calc Af Amer: 38 mL/min/{1.73_m2} — ABNORMAL LOW (ref >59)
GFR calc non Af Amer: 33 mL/min/{1.73_m2} — ABNORMAL LOW (ref >59)
GLUCOSE: 190 mg/dL — AB (ref 65–99)
Potassium: 4.4 mmol/L (ref 3.5–5.2)
SODIUM: 143 mmol/L (ref 136–144)

## 2015-01-07 LAB — HEPATITIS C ANTIBODY: Hep C Virus Ab: <0.1 s/co ratio (ref 0.0–0.9)

## 2015-01-20 ENCOUNTER — Ambulatory Visit: Payer: Medicare Other

## 2015-02-07 ENCOUNTER — Ambulatory Visit (INDEPENDENT_AMBULATORY_CARE_PROVIDER_SITE_OTHER): Payer: Medicare Other

## 2015-02-07 DIAGNOSIS — E538 Deficiency of other specified B group vitamins: Secondary | ICD-10-CM

## 2015-02-07 DIAGNOSIS — Z0000 Encounter for general adult medical examination without abnormal findings: Secondary | ICD-10-CM

## 2015-02-07 MED ORDER — CYANOCOBALAMIN 1000 MCG/ML IJ SOLN
1000.0000 ug | Freq: Once | INTRAMUSCULAR | Status: AC
  Administered 2015-02-07: 1000 ug via INTRAMUSCULAR

## 2015-02-17 ENCOUNTER — Ambulatory Visit: Payer: Medicare Other

## 2015-02-17 ENCOUNTER — Ambulatory Visit
Admission: RE | Admit: 2015-02-17 | Discharge: 2015-02-17 | Disposition: A | Discharge status: Home / Self Care | Payer: Medicare Other | Source: Ambulatory Visit | Attending: Oncology | Admitting: Oncology

## 2015-02-17 ENCOUNTER — Other Ambulatory Visit: Payer: Self-pay | Admitting: Oncology

## 2015-02-17 DIAGNOSIS — C50311 Malignant neoplasm of lower-inner quadrant of right female breast: Secondary | ICD-10-CM

## 2015-02-17 DIAGNOSIS — N641 Fat necrosis of breast: Secondary | ICD-10-CM

## 2015-02-24 ENCOUNTER — Other Ambulatory Visit: Payer: Self-pay | Admitting: Internal Medicine

## 2015-03-22 ENCOUNTER — Telehealth: Payer: Self-pay

## 2015-03-22 ENCOUNTER — Other Ambulatory Visit: Payer: Self-pay | Admitting: Internal Medicine

## 2015-03-22 MED ORDER — CYANOCOBALAMIN 1000 MCG/ML IJ SOLN: 1000.0000 ug | INTRAMUSCULAR | Status: DC

## 2015-03-22 NOTE — Telephone Encounter (Signed)
Dr. Lynnae January, patient is requesting B12 injections be sent to pharmacy to be managed by her daughter at home.

## 2015-04-05 ENCOUNTER — Telehealth: Payer: Self-pay | Admitting: Oncology

## 2015-04-05 NOTE — Telephone Encounter (Signed)
Appointments made as she is due for 70monthfollow up

## 2015-04-07 ENCOUNTER — Ambulatory Visit: Payer: Medicare Other | Admitting: Internal Medicine

## 2015-04-11 ENCOUNTER — Telehealth: Payer: Self-pay | Admitting: *Deleted

## 2015-04-11 ENCOUNTER — Encounter: Payer: Self-pay | Admitting: Internal Medicine

## 2015-04-11 ENCOUNTER — Ambulatory Visit (INDEPENDENT_AMBULATORY_CARE_PROVIDER_SITE_OTHER): Payer: Medicare Other | Admitting: Internal Medicine

## 2015-04-11 VITALS — BP 144/56 | HR 73 | Temp 98.1°F | Wt 192.8 lb

## 2015-04-11 DIAGNOSIS — M109 Gout, unspecified: Secondary | ICD-10-CM | POA: Insufficient documentation

## 2015-04-11 DIAGNOSIS — M10072 Idiopathic gout, left ankle and foot: Secondary | ICD-10-CM | POA: Diagnosis not present

## 2015-04-11 DIAGNOSIS — M10372 Gout due to renal impairment, left ankle and foot: Secondary | ICD-10-CM

## 2015-04-11 MED ORDER — COLCHICINE 0.6 MG PO TABS: 0.6000 mg | ORAL_TABLET | Freq: Every day | ORAL | Status: DC

## 2015-04-11 NOTE — Assessment & Plan Note (Addendum)
Assessment:  Left MTP pain x 3 weeks and swelling x 1 week.  Left MTP is red and swollen, painful to touch.  Normal strength and ROM.  Looks c/w gouty arthritis.  Likely due to CKD. Plan:  Colcrys 1.45m then 0.61m1 hour later today; then 0.70m68maily.  She was advised of possible GI side effects and she will call if she cannot tolerate.  She is already scheduled to see her PCP, Dr. ButLynnae Januaryxt week.  They can discuss length of treatment and when to check uric acid.

## 2015-04-11 NOTE — Telephone Encounter (Signed)
Call from patient stated that she needs the prescription for Colchicine to be sent to [facility] on [address] [facility] and is currently at the Pharmacy.  Prescription was sent to [facility]. Prescription for Colchicine 0.6 mg 1 po daily  dispense # 30 tablets with no refills per order of Dr. Redmond Pulling.  Call to [place]  prescription for Colchicine sent to [facility] was cancelled. Dr. Redmond Pulling informed of the change in location.  Sander Nephew, RN 04/11/2015 4:24 PM.

## 2015-04-11 NOTE — Patient Instructions (Signed)
1. Please take 2 (1.20m) colchicine pills today and take 1 pill (0.630m 1 hour later (3 pills total today).  Starting tomorrow, just take 1 pill (0.37m54mevery day.  Please come back to see Dr. ButLynnae Januaryxt Monday, as previously scheduled.  I have provided some information on gout and colchicine at the end of the document.   2. Please take all medications as prescribed.    3. If you have worsening of your symptoms or new symptoms arise, please call the clinic [phone] go to the ER immediately if symptoms are severe.   Gout Gout is an inflammatory arthritis caused by a buildup of uric acid crystals in the joints. Uric acid is a chemical that is normally present in the blood. When the level of uric acid in the blood is too high it can form crystals that deposit in your joints and tissues. This causes joint redness, soreness, and swelling (inflammation). Repeat attacks are common. Over time, uric acid crystals can form into masses (tophi) near a joint, destroying bone and causing disfigurement. Gout is treatable and often preventable. CAUSES  The disease begins with elevated levels of uric acid in the blood. Uric acid is produced by your body when it breaks down a naturally found substance called purines. Certain foods you eat, such as meats and fish, contain high amounts of purines. Causes of an elevated uric acid level include:  Being passed down from parent to child (heredity).  Diseases that cause increased uric acid production (such as obesity, psoriasis, and certain cancers).  Excessive alcohol use.  Diet, especially diets rich in meat and seafood.  Medicines, including certain cancer-fighting medicines (chemotherapy), water pills (diuretics), and aspirin.  Chronic kidney disease. The kidneys are no longer able to remove uric acid well.  Problems with metabolism. Conditions strongly associated with gout include:  Obesity.  High blood pressure.  High  cholesterol.  Diabetes. Not everyone with elevated uric acid levels gets gout. It is not understood why some people get gout and others do not. Surgery, joint injury, and eating too much of certain foods are some of the factors that can lead to gout attacks. SYMPTOMS   An attack of gout comes on quickly. It causes intense pain with redness, swelling, and warmth in a joint.  Fever can occur.  Often, only one joint is involved. Certain joints are more commonly involved:  Base of the big toe.  Knee.  Ankle.  Wrist.  Finger. Without treatment, an attack usually goes away in a few days to weeks. Between attacks, you usually will not have symptoms, which is different from many other forms of arthritis. DIAGNOSIS  Your caregiver will suspect gout based on your symptoms and exam. In some cases, tests may be recommended. The tests may include:  Blood tests.  Urine tests.  X-rays.  Joint fluid exam. This exam requires a needle to remove fluid from the joint (arthrocentesis). Using a microscope, gout is confirmed when uric acid crystals are seen in the joint fluid. TREATMENT  There are two phases to gout treatment: treating the sudden onset (acute) attack and preventing attacks (prophylaxis).  Treatment of an Acute Attack.  Medicines are used. These include anti-inflammatory medicines or steroid medicines.  An injection of steroid medicine into the affected joint is sometimes necessary.  The painful joint is rested. Movement can worsen the arthritis.  You may use warm or cold treatments on painful joints, depending which works best for you.  Treatment to Prevent Attacks.  If you suffer from frequent gout attacks, your caregiver may advise preventive medicine. These medicines are started after the acute attack subsides. These medicines either help your kidneys eliminate uric acid from your body or decrease your uric acid production. You may need to stay on these medicines for a  very long time.  The early phase of treatment with preventive medicine can be associated with an increase in acute gout attacks. For this reason, during the first few months of treatment, your caregiver may also advise you to take medicines usually used for acute gout treatment. Be sure you understand your caregiver's directions. Your caregiver may make several adjustments to your medicine dose before these medicines are effective.  Discuss dietary treatment with your caregiver or dietitian. Alcohol and drinks high in sugar and fructose and foods such as meat, poultry, and seafood can increase uric acid levels. Your caregiver or dietitian can advise you on drinks and foods that should be limited. HOME CARE INSTRUCTIONS   Do not take aspirin to relieve pain. This raises uric acid levels.  Only take over-the-counter or prescription medicines for pain, discomfort, or fever as directed by your caregiver.  Rest the joint as much as possible. When in bed, keep sheets and blankets off painful areas.  Keep the affected joint raised (elevated).  Apply warm or cold treatments to painful joints. Use of warm or cold treatments depends on which works best for you.  Use crutches if the painful joint is in your leg.  Drink enough fluids to keep your urine clear or pale yellow. This helps your body get rid of uric acid. Limit alcohol, sugary drinks, and fructose drinks.  Follow your dietary instructions. Pay careful attention to the amount of protein you eat. Your daily diet should emphasize fruits, vegetables, whole grains, and fat-free or low-fat milk products. Discuss the use of coffee, vitamin C, and cherries with your caregiver or dietitian. These may be helpful in lowering uric acid levels.  Maintain a healthy body weight. SEEK MEDICAL CARE IF:   You develop diarrhea, vomiting, or any side effects from medicines.  You do not feel better in 24 hours, or you are getting worse. SEEK IMMEDIATE MEDICAL  CARE IF:   Your joint becomes suddenly more tender, and you have chills or a fever. MAKE SURE YOU:   Understand these instructions.  Will watch your condition.  Will get help right away if you are not doing well or get worse.   This information is not intended to replace advice given to you by your health care provider. Make sure you discuss any questions you have with your health care provider.   Document Released: 03/02/2000 Document Revised: 03/26/2014 Document Reviewed: 10/17/2011 Elsevier Interactive Patient Education 2016 [employer].  Colchicine tablets or capsules What is this medicine? COLCHICINE (KOL chi seen) is for joint pain and swelling due to attacks of acute gouty arthritis. The medicine is also used to treat familial Mediterranean fever. This medicine may be used for other purposes; ask your health care provider or pharmacist if you have questions. What should I tell my health care provider before I take this medicine? They need to know if you have any of these conditions: -anemia -blood disorders like leukemia or lymphoma -heart disease -immune system problems -intestinal disease -kidney disease -liver disease -muscle pain or weakness -take other medicines -stomach problems -an unusual or allergic reaction to colchicine, other medicines, lactose, foods, dyes, or preservatives -pregnant or trying to get pregnant -breast-feeding How  should I use this medicine? Take this medicine by mouth with a full glass of water. Follow the directions on the prescription label. You can take it with or without food. If it upsets your stomach, take it with food. Take your medicine at regular intervals. Do not take your medicine more often than directed. A special MedGuide will be given to you by the pharmacist with each prescription and refill. Be sure to read this information carefully each time. Talk to your pediatrician regarding the use of this medicine in children. While  this drug may be prescribed for children as young as 2 years old for selected conditions, precautions do apply. Patients over 41 years old may have a stronger reaction and need a smaller dose. Overdosage: If you think you have taken too much of this medicine contact a poison control center or emergency room at once. NOTE: This medicine is only for you. Do not share this medicine with others. What if I miss a dose? If you miss a dose, take it as soon as you can. If it is almost time for your next dose, take only that dose. Do not take double or extra doses. What may interact with this medicine? Do not take this medicine with any of the following medications: -certain medicines for fungal infections like itraconazole This medicine may also interact with the following medications: -alcohol -certain medicines for cholesterol like atorvastatin -certain medicines for coughs and colds -certain medicines to help you breathe better -cyclosporine -digoxin -epinephrine -grapefruit or grapefruit juice -methenamine -other medicines for fungal infection -sodium bicarbonate -some antibiotics like clarithromycin, erythromycin, and telithromycin -some medicines for an irregular heartbeat or other heart problems -some medicines for cancer, like lapatinib and tamoxifen -some medicines for HIV This list may not describe all possible interactions. Give your health care provider a list of all the medicines, herbs, non-prescription drugs, or dietary supplements you use. Also tell them if you smoke, drink alcohol, or use illegal drugs. Some items may interact with your medicine. What should I watch for while using this medicine? Visit your doctor or health care professional for regular checks on your progress. You may need periodic blood checks. Alcohol can increase the chance of getting stomach problems and gout attacks. Do not drink alcohol. What side effects may I notice from receiving this medicine? Side  effects that you should report to your doctor or health care professional as soon as possible: -allergic reactions like skin rash, itching or hives, swelling of the face, lips, or tongue -fever, chills, or sore throat -muscle tenderness, pain, or weakness -numbness or tingling in hands or feet -unusual bleeding or bruising -unusually weak or tired -vomiting Side effects that usually do not require medical attention (report to your doctor or health care professional if they continue or are bothersome): -diarrhea -hair loss -loss of appetite -stomach pain or nausea This list may not describe all possible side effects. Call your doctor for medical advice about side effects. You may report side effects to FDA at 1-800-FDA-1088. Where should I keep my medicine? Keep out of the reach of children. Store at room temperature between 15 and 30 degrees C (59 and 86 degrees F). Keep container tightly closed. Protect from light. Throw away any unused medicine after the expiration date. NOTE: This sheet is a summary. It may not cover all possible information. If you have questions about this medicine, talk to your doctor, pharmacist, or health care provider.    2016, Elsevier/Gold Standard. (2012-09-01 16:48:38)

## 2015-04-11 NOTE — Telephone Encounter (Signed)
Rx re-sent to [facility] on [address].

## 2015-04-11 NOTE — Progress Notes (Signed)
Subjective:    Patient ID: [name], female    DOB: 03/08/1946, 70 y.o.   MRN: [id number]  HPI Comments: [name] is a 70 year old woman with PMH as below here with c/o left foot pain x 3 weeks.  Pain is located at 1st MTP.  Foot Pain This is a new (pain x 3 weeks, swelling x 1 week) problem. The current episode started 1 to 4 weeks ago. The problem occurs constantly. The problem has been gradually worsening. Associated symptoms include arthralgias and joint swelling. Pertinent negatives include no anorexia, chest pain, chills, fever or myalgias. Nothing aggravates the symptoms. She has tried NSAIDs (Aleve 1-2 daily) for the symptoms. The treatment provided mild relief.   No dietary changes, no increased meat, she does not drink EtOH.  Past Medical History  Diagnosis Date  . Hypertension     2006 B renal arteries patent. 2003 MRA - no RAS. 2003 pheo W/U Dr Hassell Done reportedly negative.  Marland Kitchen CAD (coronary artery disease) 1996    1996 - PTCA and angioplasty diagonal branch. 2000 - Rotoblator & angiopllasty of diagonal. 2006 - subendocardial AMI, DES to proximal LAD.Marland Kitchen Also had 90% stenosis in distal apical LAD. EF 55 with apical hypokinesis. Indefinite ASA and Plavix.  . Abdominal discomfort     Chronic N/V/D. Presumptive dx Crohn's dx per elevated p ANCA. Failed Entocort and Pentasa. Sep 2003 - ileocolectomy c anastomosis per Dr Deon Pilling 2/2 adhesions - path was hegative for Crohns. EGD, Sm bowel follow through (11/03), and an eteroclysis (10/03) were unrevealing. Cuases hypomag and hypocalcemia.  . Anemia     Multifactorial. Baseline HgB 10-11 ish. B12 def - 150 in 3/10. Fe Def - ferritin 35 3/10. Both are being repleted.  . Stroke [facility])     Incidental finding MRI 2002 L lacunar infarct  . Diabetes mellitus     Insulin dependent  . Allergy     Seasonal  . Chronic pain     CT 10/10 = Spinal stenosis L2 - S1.  Marland Kitchen Chronic kidney disease     Chronic renal insuff baseline Cr 1.2 - 1.4 ish.    . Adnexal mass 8/03    s/p lap BSO (R ovarian fibroma) & lysis of adhesions  . Hyperlipidemia     Managed with both a statin and Welchol. Welchol stopped 2014 2/2 cost and started on fenofibrate   . Wears dentures     top  . RBBB   . Breast cancer ([facility]) 03/16/13    right, 5 o'clock  . Hx of radiation therapy 06/02/13- 07/16/13    right rbeast 4500 cGy 25 sessions, right breast boost 1600 cGy in 8 sessions  . Lupus [facility])    Current Outpatient Prescriptions on File Prior to Visit  Medication Sig Dispense Refill  . amLODipine (NORVASC) 10 MG tablet Take 1 tablet (10 mg total) by mouth daily. 90 tablet 3  . anastrozole (ARIMIDEX) 1 MG tablet Take 1 tablet (1 mg total) by mouth daily. 30 tablet 5  . aspirin EC 81 MG tablet Take 81 mg by mouth daily.    Marland Kitchen atenolol (TENORMIN) 50 MG tablet Take 1 tablet (50 mg total) by mouth 2 (two) times daily. 180 tablet 3  . atorvastatin (LIPITOR) 20 MG tablet Take 1 tablet (20 mg total) by mouth at bedtime. 90 tablet 3  . clopidogrel (PLAVIX) 75 MG tablet TAKE ONE (1) TABLET EACH DAY 90 tablet 3  . colesevelam (WELCHOL) 625 MG tablet  TAKE THREE TABLETS BY MOUTH ONCE DAILY 270 tablet 3  . cyanocobalamin (,VITAMIN B-12,) 1000 MCG/ML injection Inject 1 mL (1,000 mcg total) into the muscle every 30 (thirty) days. 10 mL 1  . desloratadine (CLARINEX) 5 MG tablet Take 5 mg by mouth daily as needed (allergies).     . fenofibrate (TRICOR) 48 MG tablet Take 1 tablet (48 mg total) by mouth daily. 90 tablet 3  . ferrous fumarate (HEMOCYTE - 106 MG FE) 325 (106 FE) MG TABS tablet Take 1 tablet (106 mg of iron total) by mouth daily. 30 each 5  . furosemide (LASIX) 20 MG tablet Take 1 tablet (20 mg total) by mouth 2 (two) times daily. 180 tablet 3  . gabapentin (NEURONTIN) 300 MG capsule Take 1 capsule by mouth at  bedtime 90 capsule 3  . glucose blood (ONE TOUCH TEST STRIPS) test strip Use to Check Blood Sugars 2-3 Times Daily. Dx Code:E11.29. 200 each 3  . hydrOXYzine  (VISTARIL) 50 MG capsule TAKE ONE CAPSULE BY MOUTH AT BEDTIME AS NEEDED 30 capsule 2  . insulin detemir (LEVEMIR) 100 UNIT/ML injection Inject 0.36 mLs (36 Units total) into the skin at bedtime. 40 mL 3  . lisinopril (PRINIVIL,ZESTRIL) 10 MG tablet Take 2 tablets (20 mg total) by mouth daily. 180 tablet 3  . loperamide (IMODIUM) 2 MG capsule TAKE ONE CAPSULE BY MOUTH ONCE DAILY AS NEEDED FOR DIARRHEA OR LOOSE STOOLS (Patient not taking: Reported on 07/22/2014) 30 capsule 2  . nitroGLYCERIN (NITROSTAT) 0.4 MG SL tablet Place 1 tablet (0.4 mg total) under the tongue every 5 (five) minutes as needed for chest pain. (Patient not taking: Reported on 12/06/2014) 25 tablet 3  . potassium chloride (K-DUR) 10 MEQ tablet Take 2 tablets (20 mEq total) by mouth daily. 180 tablet 3  . repaglinide (PRANDIN) 2 MG tablet TAKE 1 TABLET BY MOUTH 3  TIMES DAILY BEFORE MEALS. 180 tablet 3  . traMADol (ULTRAM) 50 MG tablet Take 1 tablet (50 mg total) by mouth every 6 (six) hours as needed. (Patient not taking: Reported on 12/06/2014) 30 tablet 1   No current facility-administered medications on file prior to visit.    Review of Systems  Constitutional: Negative for fever and chills.  Respiratory: Negative for shortness of breath.   Cardiovascular: Negative for chest pain and leg swelling.  Gastrointestinal: Negative for anorexia.  Genitourinary: Negative for difficulty urinating.  Musculoskeletal: Positive for joint swelling and arthralgias. Negative for myalgias.       Per HPI  Neurological: Negative for syncope and light-headedness.       Filed Vitals:   04/11/15 1424  BP: 144/56  Pulse: 73  Temp: 98.1 F (36.7 C)  TempSrc: Oral  Weight: 192 lb 12.8 oz (87.454 kg)  SpO2: 100%     Objective:   Physical Exam  Constitutional: She is oriented to person, place, and time. She appears well-developed. No distress.  HENT:  Head: Normocephalic and atraumatic.  Mouth/Throat: Oropharynx is clear and moist. No  oropharyngeal exudate.  Eyes: EOM are normal. Pupils are equal, round, and reactive to light.  Neck: Neck supple.  Cardiovascular: Normal rate, regular rhythm and intact distal pulses.  Exam reveals no gallop and no friction rub.   Murmur heard. 2/6 systolic murmur  Pulmonary/Chest: Effort normal and breath sounds normal. No respiratory distress. She has no wheezes. She has no rales.  Abdominal: Soft. Bowel sounds are normal. She exhibits no distension and no mass. There is no tenderness. There  is no rebound and no guarding.  Musculoskeletal: Normal range of motion. She exhibits edema and tenderness.  Left 1st MTP swollen and TTP. Upper and lower strength and sensation are grossly intact and equal.  Neurological: She is alert and oriented to person, place, and time. No cranial nerve deficit.  Skin: Skin is warm. She is not diaphoretic.  Psychiatric: She has a normal mood and affect. Her behavior is normal. Judgment and thought content normal.  Vitals reviewed.         Assessment & Plan:  Please see problem based charting for A&P.

## 2015-04-12 NOTE — Progress Notes (Signed)
I saw and evaluated the patient. I personally confirmed the key portions of Dr. Dois Davenport history and exam and reviewed pertinent patient test results. The assessment, diagnosis, and plan were formulated together and I agree with the documentation in the resident's note.

## 2015-04-18 ENCOUNTER — Encounter: Payer: Self-pay | Admitting: Internal Medicine

## 2015-04-18 ENCOUNTER — Ambulatory Visit (INDEPENDENT_AMBULATORY_CARE_PROVIDER_SITE_OTHER): Payer: Medicare Other | Admitting: Internal Medicine

## 2015-04-18 VITALS — BP 129/55 | HR 57 | Temp 98.1°F | Wt 190.5 lb

## 2015-04-18 DIAGNOSIS — M10372 Gout due to renal impairment, left ankle and foot: Secondary | ICD-10-CM

## 2015-04-18 DIAGNOSIS — E1165 Type 2 diabetes mellitus with hyperglycemia: Secondary | ICD-10-CM

## 2015-04-18 DIAGNOSIS — N183 Chronic kidney disease, stage 3 unspecified: Secondary | ICD-10-CM

## 2015-04-18 DIAGNOSIS — E1142 Type 2 diabetes mellitus with diabetic polyneuropathy: Secondary | ICD-10-CM

## 2015-04-18 DIAGNOSIS — E1140 Type 2 diabetes mellitus with diabetic neuropathy, unspecified: Secondary | ICD-10-CM

## 2015-04-18 DIAGNOSIS — R109 Unspecified abdominal pain: Secondary | ICD-10-CM

## 2015-04-18 DIAGNOSIS — Z7984 Long term (current) use of oral hypoglycemic drugs: Secondary | ICD-10-CM

## 2015-04-18 DIAGNOSIS — M109 Gout, unspecified: Secondary | ICD-10-CM

## 2015-04-18 DIAGNOSIS — I10 Essential (primary) hypertension: Secondary | ICD-10-CM

## 2015-04-18 DIAGNOSIS — Z794 Long term (current) use of insulin: Secondary | ICD-10-CM

## 2015-04-18 DIAGNOSIS — Z79811 Long term (current) use of aromatase inhibitors: Secondary | ICD-10-CM | POA: Diagnosis not present

## 2015-04-18 DIAGNOSIS — E1169 Type 2 diabetes mellitus with other specified complication: Secondary | ICD-10-CM

## 2015-04-18 DIAGNOSIS — E785 Hyperlipidemia, unspecified: Principal | ICD-10-CM

## 2015-04-18 DIAGNOSIS — I129 Hypertensive chronic kidney disease with stage 1 through stage 4 chronic kidney disease, or unspecified chronic kidney disease: Secondary | ICD-10-CM | POA: Diagnosis not present

## 2015-04-18 DIAGNOSIS — D509 Iron deficiency anemia, unspecified: Secondary | ICD-10-CM

## 2015-04-18 DIAGNOSIS — C50311 Malignant neoplasm of lower-inner quadrant of right female breast: Secondary | ICD-10-CM | POA: Diagnosis not present

## 2015-04-18 DIAGNOSIS — E1122 Type 2 diabetes mellitus with diabetic chronic kidney disease: Secondary | ICD-10-CM | POA: Diagnosis not present

## 2015-04-18 DIAGNOSIS — I2510 Atherosclerotic heart disease of native coronary artery without angina pectoris: Secondary | ICD-10-CM

## 2015-04-18 DIAGNOSIS — J302 Other seasonal allergic rhinitis: Secondary | ICD-10-CM

## 2015-04-18 DIAGNOSIS — E784 Other hyperlipidemia: Secondary | ICD-10-CM

## 2015-04-18 DIAGNOSIS — Z79899 Other long term (current) drug therapy: Secondary | ICD-10-CM

## 2015-04-18 DIAGNOSIS — IMO0002 Reserved for concepts with insufficient information to code with codable children: Secondary | ICD-10-CM

## 2015-04-18 LAB — POCT GLYCOSYLATED HEMOGLOBIN (HGB A1C): HEMOGLOBIN A1C: 7.5

## 2015-04-18 LAB — GLUCOSE, CAPILLARY: Glucose-Capillary: 129 mg/dL — ABNORMAL HIGH (ref 65–99)

## 2015-04-18 MED ORDER — LISINOPRIL 20 MG PO TABS: 20.0000 mg | ORAL_TABLET | Freq: Every day | ORAL | Status: DC

## 2015-04-18 MED ORDER — FENOFIBRATE 48 MG PO TABS: 48.0000 mg | ORAL_TABLET | Freq: Every day | ORAL | Status: DC

## 2015-04-18 NOTE — Assessment & Plan Note (Signed)
She remains on her iron pills. HgB is low but stable in upper 9's. Multifactorial - CKD, irone def, and B 12 def. Ferritin was last checked 2 yrs ago and low at 52 - I will need to check it at next appt.  PLAN:  Cont current meds

## 2015-04-18 NOTE — Assessment & Plan Note (Signed)
She is on levemir 36 units and prandin 2 mg BID. Her A1C is 7.5. She checks her CBG QSM and ranges from well controlled to 160's. The highs are related to food indiscretion. She cont to exercise and lose weight. She is UTD on all metrics.  PLAN:  Cont current meds

## 2015-04-18 NOTE — Assessment & Plan Note (Addendum)
For months, but less than one yr, she has had a stuffy nose, PND, and occ sneezing that interrupt her sleep. She states the steroid nose spray and clarinex are not working. Sxs OK during day. She has modified her bedroom environment to get rid of allergens.  PLAN  Afrin 3 nights before bed Benedryl for 3-4 nights before bed  Call if no better

## 2015-04-18 NOTE — Assessment & Plan Note (Signed)
We review her Cr trend - trend up. Also her microalb trend - now almost nl. I am leaving her lisinopril at 10 mg. She takes aleve 2-3 times weekly for hand pain. She has normalized her BP and A1C. She is doing everything she can to prevent worsening kidney fxn.  PLAN : Cr today

## 2015-04-18 NOTE — Progress Notes (Signed)
   Subjective:    Patient ID: [name], female    DOB: June 13, 1945, 70 y.o.   MRN: [id number]  HPI  [name] is here for DM F/U. Please see the A&P for the status of the pt's chronic medical problems.  ROS : per ROS section and in problem oriented charting. All other systems are negative. PMHx, Soc hx, and / or Fam hx : Oldest daughter will confer with youngest who is MD and oldest will make all decisions but no official medical POA   Review of Systems  Constitutional: Negative for fatigue and unexpected weight change.  HENT: Positive for congestion, postnasal drip and sneezing.   Eyes: Negative for redness and itching.  Gastrointestinal: Negative for abdominal pain, diarrhea and constipation.  Musculoskeletal: Positive for arthralgias.  Neurological:       Neuropathy resolved       Objective:   Physical Exam  Constitutional: She appears well-developed and well-nourished. No distress.  HENT:  Head: Normocephalic and atraumatic.  Right Ear: External ear normal.  Left Ear: External ear normal.  Nose: Nose normal.  Eyes: Conjunctivae and EOM are normal.  Cardiovascular: Normal rate, regular rhythm and normal heart sounds.   Pulmonary/Chest: Effort normal and breath sounds normal. No respiratory distress.  Musculoskeletal: Normal range of motion.  Neurological: She is alert.  Skin: Skin is warm and dry. She is not diaphoretic.  Psychiatric: She has a normal mood and affect. Her behavior is normal. Judgment and thought content normal.          Assessment & Plan:

## 2015-04-18 NOTE — Patient Instructions (Signed)
1. See me in 3 months. Get B 12 in one month 2. Stop the Welchol. 3. Take the lipitor and fenofibrate or your cholesterol 4. Sop the colchicine for your gout. If the gout flares, you may resume the medicine. 5. I sent in lisinopril 20 and fenofibrate to [facility] 6. I will mail your your blood test results

## 2015-04-18 NOTE — Assessment & Plan Note (Signed)
I had stopped the welchol in 2014 2/2 cpst and started fenofibrate. Unfortunately a non physician added it back to her med list and another provider who was seeing the pt refilled it when she stated she needed a prescription on all of her meds to take to the [place]. I then advertently refilled it. So she has been on atorvo 20, welchol, and fenofibrate. LDL 79 and trig 217 in Aug. I am stopping the welchol again and cont just the statin and the fenofibrate. I will recheck the FLP next appt. With her weight loss her tri might cont to normalize.   PLAN : see above

## 2015-04-18 NOTE — Assessment & Plan Note (Signed)
I verified the info in the overview. Occ her D flare and she takes imodium PRN.  PLAN:  Cont current meds

## 2015-04-18 NOTE — Assessment & Plan Note (Signed)
She remains on her Arimidex. She cont to see Dr [name] who Rx's this med.  PLAN :  Per Dr [name]

## 2015-04-18 NOTE — Assessment & Plan Note (Signed)
She is taking the Gaba 300 QHS and her neuropathy has resolved.  PLAN:  Cont current meds

## 2015-04-18 NOTE — Assessment & Plan Note (Signed)
She had been on lisinppril 10 but due to high BP, it had been increased to 10 mg 2 QD but her pharmacy never sent her the med so she remains on 10 mg. BP today is actually great on repeat. This is probably 2/2 exercise and weight loss. We discussed risks and benefits of dosing and decided to stay on 10 mg - she will cut the 20 mg in half.  PLAN : Cont lisinopril 20 mg 1/2 pill QD Norvasc 10 Atenolol 5 Lasix 20 mg 2 in AM

## 2015-04-19 LAB — BMP8+ANION GAP
ANION GAP: 17 mmol/L (ref 10.0–18.0)
BUN/Creatinine Ratio: 19 (ref 11–26)
BUN: 25 mg/dL (ref 8–27)
CO2: 22 mmol/L (ref 18–29)
Calcium: 9.9 mg/dL (ref 8.7–10.3)
Chloride: 103 mmol/L (ref 96–106)
Creatinine, Ser: 1.35 mg/dL — ABNORMAL HIGH (ref 0.57–1.00)
GFR calc Af Amer: 47 mL/min/{1.73_m2} — ABNORMAL LOW (ref >59)
GFR, EST NON AFRICAN AMERICAN: 41 mL/min/{1.73_m2} — AB (ref >59)
Glucose: 88 mg/dL (ref 65–99)
POTASSIUM: 4.4 mmol/L (ref 3.5–5.2)
SODIUM: 142 mmol/L (ref 134–144)

## 2015-04-19 NOTE — Assessment & Plan Note (Signed)
Gout resolved with colchicine. Still taking it daily (about one week total tx). Still has a bit of tenderness when wearing shoe or something rubs against the lateral L MCP joint. She has also noticed that the skin is getting dark. The redness, severe pain, and warmth are resolved. on exam, she has a bunion and loss of skin wrinkles over area and increased pigmentation. We reviewed the UTD article on bunions and she elected conservative tx.  As for gout, we discussed episodic tx versus daily prophylactic tx. We discussed guidelines rec immediate prophylaxis tx with renal failure. We discussed pros and cons of each approached and decided on episodic tx for now. She has more colchicine in case.   PLAN : Conservative bunion tx Episodic gout tx with colchicine

## 2015-04-29 ENCOUNTER — Encounter: Payer: Self-pay | Admitting: Internal Medicine

## 2015-05-09 ENCOUNTER — Other Ambulatory Visit: Payer: Self-pay | Admitting: Internal Medicine

## 2015-05-16 ENCOUNTER — Ambulatory Visit (INDEPENDENT_AMBULATORY_CARE_PROVIDER_SITE_OTHER): Payer: Medicare Other | Admitting: *Deleted

## 2015-05-16 DIAGNOSIS — E538 Deficiency of other specified B group vitamins: Secondary | ICD-10-CM | POA: Diagnosis not present

## 2015-05-16 MED ORDER — CYANOCOBALAMIN 1000 MCG/ML IJ SOLN
1000.0000 ug | Freq: Once | INTRAMUSCULAR | Status: AC
  Administered 2015-05-16: 1000 ug via INTRAMUSCULAR

## 2015-05-17 ENCOUNTER — Ambulatory Visit: Payer: Medicare Other

## 2015-05-29 ENCOUNTER — Other Ambulatory Visit: Payer: Self-pay | Admitting: Oncology

## 2015-05-29 DIAGNOSIS — C50311 Malignant neoplasm of lower-inner quadrant of right female breast: Secondary | ICD-10-CM

## 2015-06-02 ENCOUNTER — Other Ambulatory Visit (HOSPITAL_BASED_OUTPATIENT_CLINIC_OR_DEPARTMENT_OTHER): Payer: Medicare Other

## 2015-06-02 ENCOUNTER — Ambulatory Visit (HOSPITAL_BASED_OUTPATIENT_CLINIC_OR_DEPARTMENT_OTHER): Payer: Medicare Other | Admitting: Oncology

## 2015-06-02 ENCOUNTER — Telehealth: Payer: Self-pay | Admitting: Oncology

## 2015-06-02 ENCOUNTER — Encounter: Payer: Self-pay | Admitting: Oncology

## 2015-06-02 VITALS — BP 145/62 | HR 78 | Temp 97.8°F | Resp 18 | Ht 64.0 in | Wt 186.3 lb

## 2015-06-02 DIAGNOSIS — E2839 Other primary ovarian failure: Secondary | ICD-10-CM

## 2015-06-02 DIAGNOSIS — C50311 Malignant neoplasm of lower-inner quadrant of right female breast: Secondary | ICD-10-CM | POA: Diagnosis not present

## 2015-06-02 DIAGNOSIS — D631 Anemia in chronic kidney disease: Secondary | ICD-10-CM

## 2015-06-02 DIAGNOSIS — I2510 Atherosclerotic heart disease of native coronary artery without angina pectoris: Secondary | ICD-10-CM

## 2015-06-02 DIAGNOSIS — C50911 Malignant neoplasm of unspecified site of right female breast: Secondary | ICD-10-CM | POA: Diagnosis not present

## 2015-06-02 DIAGNOSIS — E119 Type 2 diabetes mellitus without complications: Secondary | ICD-10-CM

## 2015-06-02 DIAGNOSIS — N189 Chronic kidney disease, unspecified: Secondary | ICD-10-CM

## 2015-06-02 DIAGNOSIS — D638 Anemia in other chronic diseases classified elsewhere: Secondary | ICD-10-CM | POA: Diagnosis not present

## 2015-06-02 DIAGNOSIS — I10 Essential (primary) hypertension: Secondary | ICD-10-CM

## 2015-06-02 DIAGNOSIS — N183 Chronic kidney disease, stage 3 (moderate): Secondary | ICD-10-CM | POA: Diagnosis not present

## 2015-06-02 DIAGNOSIS — I890 Lymphedema, not elsewhere classified: Secondary | ICD-10-CM

## 2015-06-02 DIAGNOSIS — D649 Anemia, unspecified: Secondary | ICD-10-CM

## 2015-06-02 LAB — CBC WITH DIFFERENTIAL/PLATELET
BASO%: 0.1 % (ref 0.0–2.0)
Basophils Absolute: 0 10*3/uL (ref 0.0–0.1)
EOS%: 1.5 % (ref 0.0–7.0)
Eosinophils Absolute: 0.1 10*3/uL (ref 0.0–0.5)
HEMATOCRIT: 31.2 % — AB (ref 34.8–46.6)
HGB: 9.9 g/dL — ABNORMAL LOW (ref 11.6–15.9)
LYMPH#: 1.4 10*3/uL (ref 0.9–3.3)
LYMPH%: 19.1 % (ref 14.0–49.7)
MCH: 26.3 pg (ref 25.1–34.0)
MCHC: 31.7 g/dL (ref 31.5–36.0)
MCV: 82.8 fL (ref 79.5–101.0)
MONO#: 0.3 10*3/uL (ref 0.1–0.9)
MONO%: 3.8 % (ref 0.0–14.0)
NEUT#: 5.4 10*3/uL (ref 1.5–6.5)
NEUT%: 75.5 % (ref 38.4–76.8)
PLATELETS: 294 10*3/uL (ref 145–400)
RBC: 3.77 10*6/uL (ref 3.70–5.45)
RDW: 14.6 % — AB (ref 11.2–14.5)
WBC: 7.2 10*3/uL (ref 3.9–10.3)

## 2015-06-02 LAB — COMPREHENSIVE METABOLIC PANEL
ALBUMIN: 3.8 g/dL (ref 3.5–5.0)
ALT: 14 U/L (ref 0–55)
ANION GAP: 8 meq/L (ref 3–11)
AST: 12 U/L (ref 5–34)
Alkaline Phosphatase: 75 U/L (ref 40–150)
BUN: 37.8 mg/dL — ABNORMAL HIGH (ref 7.0–26.0)
CALCIUM: 9.7 mg/dL (ref 8.4–10.4)
CO2: 29 mEq/L (ref 22–29)
CREATININE: 1.8 mg/dL — AB (ref 0.6–1.1)
Chloride: 103 mEq/L (ref 98–109)
EGFR: 33 mL/min/{1.73_m2} — ABNORMAL LOW (ref >90)
Glucose: 160 mg/dl — ABNORMAL HIGH (ref 70–140)
Potassium: 3.9 mEq/L (ref 3.5–5.1)
Sodium: 141 mEq/L (ref 136–145)
TOTAL PROTEIN: 8.7 g/dL — AB (ref 6.4–8.3)

## 2015-06-02 NOTE — Progress Notes (Signed)
OFFICE PROGRESS NOTE   June 02, 2015   Physicians: Humphrey Rolls, [place]); Larey Dresser; [place], [place]; [place]; [place], [place], Honor Junes, J/ Mina Marble  INTERVAL HISTORY:  Patient is seen, alone for visit, in continuing attention to stage 1 infiltrating ductal carcinoma of right breast for which she continues arimidex, this begun 07-2013. Most recent bilateral 3D mammograms were at [facility] 02-17-15, with scattered fibroglandular densities and no mammographic findings of concern.  Last DEXA 08-26-13, needs this repeated 6-201 on the Arimidex. She saw Dr [name] recently, referral made to lymphedema PT at that visit; I have spoken directly with scheduling for lymphedema PT, that appointment set up for 06-07-15. She will see Dr [name] again in a year. Patient continues regular follow up with Dr Lynnae January as PCP.  Patient reports flu in late Jan, despite flu vaccine. She has felt well since then, with good energy including working out at [employer] with water aerobics, treadmill and bike 3x weekly. She does not mention discomfort from increasing lymphedema right breast, and is not aware of changes in breasts otherwise. She denies bleeding, increased SOB, new or different pain. Bowels ok. No abdominal or pelvic pain. No LE swelling.  Remainder of 10 point Review of Systems negative  No central catheter No genetics testing.  She will have flu vaccine elsewhere prior to end of Oct.  ONCOLOGIC HISTORY Screening mammogram on 02/06/2013.possibe mass in the right breast; 02/27/2013 diagnostic mammogram and ultrasound showed 6 x 5 mm mass at 5:00 position. Lymph nodes were not enlarged. Biopsy 03/16/2013 invasive ductal carcinoma, low grade, ER positive PR positive HER-2/neu negative with a proliferation marker Ki-67 of 20%.  Lumpectomy with sentinel lymph node biopsy on 05/12/13 with final pathology revealing:  Grade: II of III  Tumor size (gross measurement: 0.9 cm  Lymph nodes: 0/6  Breast prognostic  profile:  Estrogen receptor: Not repeated, previous study demonstrated 100% positivity  Progesterone receptor: Not repeated, previous study demonstrated 12% positivity  Her 2 neu: Repeated, previous study demonstrated no amplification (1.79)  Oncotype Dx testing score: Her Oncotype score was 19 giving her a 12% risk of distant recurrence with tamoxifen only for 5 years. She does not want chemotherapy.  Radiation given 06-02-13 thru 07-16-13 4500 Gy to right breast with boost 1600 Gy. Began Arimidex 08-06-2013  Anemia: present at least back to 2012. SPEP 03-2014 no M spike and hemoglobin electrophoresis 03-2014 was normal. Erythropoietin level was elevated at 37.8. Urinalysis 02-2014 was negative for blood. Iron and ferritin were at least low normal in 07-2013 and B12 high in 06-2012.   Objective:  Vital signs in last 24 hours:  BP 145/62 mmHg  Pulse 78  Temp(Src) 97.8 F (36.6 C) (Oral)  Resp 18  Ht 5' 4"  (1.626 m)  Wt 186 lb 4.8 oz (84.505 kg)  BMI 31.96 kg/m2  SpO2 100% Weight down 6 lbs from 11-2014. Alert, oriented and appropriate. Ambulatory without assistance.  No alopecia  HEENT:PERRL, sclerae not icteric. Oral mucosa moist without lesions, posterior pharynx clear.  Neck supple. No JVD.  Lymphatics:no cervical,suraclavicular, axillary or inguinal adenopathy Resp: clear to auscultation bilaterally and normal percussion bilaterally Cardio: regular rate and rhythm. No gallop. GI: soft, nontender, not distended, no mass or organomegaly. Normally active bowel sounds.  Musculoskeletal/ Extremities: without pitting edema, cords, tenderness Neuro: speech fluent, nonfocal. PSYCH appropriate mood and affect Skin without rash, ecchymosis, petechiae Breasts: right with unremarkable lumpectomy scar, hyperpigmentation still in RT field, increased, marked lymphedema in dependent breast particularly, no erythema  or tenderness. Otherwise bilaterally without dominant mass, skin or nipple  findings. Axillae benign.   Lab Results:  Results for orders placed or performed in visit on 06/02/15  CBC with Differential  Result Value Ref Range   WBC 7.2 3.9 - 10.3 10e3/uL   NEUT# 5.4 1.5 - 6.5 10e3/uL   HGB 9.9 (L) 11.6 - 15.9 g/dL   HCT 31.2 (L) 34.8 - 46.6 %   Platelets 294 145 - 400 10e3/uL   MCV 82.8 79.5 - 101.0 fL   MCH 26.3 25.1 - 34.0 pg   MCHC 31.7 31.5 - 36.0 g/dL   RBC 3.77 3.70 - 5.45 10e6/uL   RDW 14.6 (H) 11.2 - 14.5 %   lymph# 1.4 0.9 - 3.3 10e3/uL   MONO# 0.3 0.1 - 0.9 10e3/uL   Eosinophils Absolute 0.1 0.0 - 0.5 10e3/uL   Basophils Absolute 0.0 0.0 - 0.1 10e3/uL   NEUT% 75.5 38.4 - 76.8 %   LYMPH% 19.1 14.0 - 49.7 %   MONO% 3.8 0.0 - 14.0 %   EOS% 1.5 0.0 - 7.0 %   BASO% 0.1 0.0 - 2.0 %  Comprehensive metabolic panel  Result Value Ref Range   Sodium 141 136 - 145 mEq/L   Potassium 3.9 3.5 - 5.1 mEq/L   Chloride 103 98 - 109 mEq/L   CO2 29 22 - 29 mEq/L   Glucose 160 (H) 70 - 140 mg/dl   BUN 37.8 (H) 7.0 - 26.0 mg/dL   Creatinine 1.8 (H) 0.6 - 1.1 mg/dL   Total Bilirubin <0.30 0.20 - 1.20 mg/dL   Alkaline Phosphatase 75 40 - 150 U/L   AST 12 5 - 34 U/L   ALT 14 0 - 55 U/L   Total Protein 8.7 (H) 6.4 - 8.3 g/dL   Albumin 3.8 3.5 - 5.0 g/dL   Calcium 9.7 8.4 - 10.4 mg/dL   Anion Gap 8 3 - 11 mEq/L   EGFR 33 (L) >90 ml/min/1.73 m2     Studies/Results: EXAM:  [facility]ITAL DIAGNOSTIC BILATERAL MAMMOGRAM WITH 3D TOMOSYNTHESIS AND CAD  COMPARISON: Previous exam(s).  ACR Breast Density Category b: There are scattered areas of fibroglandular density.  FINDINGS: There are stable postsurgical changes within the right breast. Biopsy site marker within the outer right breast is stable in position.  There are no new dominant masses, suspicious calcifications or secondary signs of malignancy within either breast.  Mammographic images were processed with CAD.  IMPRESSION: No mammographic evidence of malignancy within  either breast. Stable postsurgical changes within the right breast.  RECOMMENDATION: Bilateral diagnostic mammogram in 1 year.   Medications: I have reviewed the patient's current medications. Continue arimidex  DISCUSSION Patient in agreement with lymphedema PT appointment date and time per my discussion with that office now. Will get DEXA in June. Encouraged her to continue regular weight bearing exercise and water aerobics Discussed arthralgias related to aromatase inhibitors, which resolve quickly on holding or stopping the AI.  Assessment/Plan: 1. T1N0 invasive ductal carcinoma with DCIS of right breast: post lumpectomy and 6 sentinel nodes 04-22-2013, ER and PR +, HER 2 negative, local radiation and on arimidex since 07-2013 which she will continue. Lymphedema of right breast related to treatment, lymphedema PT as above. Fat necrosis by biopsy right breast 07-2014. Bilateral mammograms due Dec 2017 and DEXA in 08-2015. I will see her in 6 months with labs, or sooner if needed 2.anemia: back at least to 2012. Appears likely from CKD and other  chronic disease by evaluation as noted above and seems asymptomatic. I believe she is still taking oral iron, so will check ferritin with next labs; question of ileocolectomy so will repeat B12 with next labs (ok in 2014). Other counts ok 3.low back, right hip and RLE pain/ stiffness from degenerative arthritis and spinal stenosis. Orthopedics involved. 4.sclerotic changes in lumbar spine on DEXA, in region of laminectomy.  5.diabetes on insulin managed by PCP 6.chronic renal insufficiency , CKD III 7.. CAD, HTN: followed by Dr Johnsie Cancel  8..post right colectomy (or ileocolectomy). SBO 08-2013 thought from adhesions, resolved with bowel rest.    All questions answered. Time spent 25 min including >50% counseling and coordination of care.   Gordy Levan, MD   06/02/2015, 4:51 PM

## 2015-06-02 NOTE — Telephone Encounter (Signed)
appt and bone density scheduled per 3/16 pof

## 2015-06-03 ENCOUNTER — Other Ambulatory Visit: Payer: Self-pay | Admitting: Internal Medicine

## 2015-06-06 ENCOUNTER — Other Ambulatory Visit: Payer: Self-pay | Admitting: Internal Medicine

## 2015-06-07 ENCOUNTER — Ambulatory Visit: Payer: Medicare Other | Attending: General Surgery | Admitting: Physical Therapy

## 2015-06-07 DIAGNOSIS — I890 Lymphedema, not elsewhere classified: Secondary | ICD-10-CM | POA: Insufficient documentation

## 2015-06-08 NOTE — Therapy (Signed)
[place], [place], [address] Phone: [phone]   Fax:  [phone]  Physical Therapy Evaluation  Patient Details  Name: [name] MRN: [id number] Date of Birth: June 29, 1945 Referring Provider: Dr [name]  Encounter Date: 06/07/2015      PT End of Session - 06/07/15 1649    Visit Number 1   Number of Visits 3   Date for PT Re-Evaluation 07/08/15   PT Start Time 1600   PT Stop Time 1640   PT Time Calculation (min) 40 min   Activity Tolerance Patient tolerated treatment well   Behavior During Therapy [facility] for tasks assessed/performed      Past Medical History  Diagnosis Date  . Hypertension     2006 B renal arteries patent. 2003 MRA - no RAS. 2003 pheo W/U Dr Hassell Done reportedly negative.  Marland Kitchen CAD (coronary artery disease) 1996    1996 - PTCA and angioplasty diagonal branch. 2000 - Rotoblator & angiopllasty of diagonal. 2006 - subendocardial AMI, DES to proximal LAD.Marland Kitchen Also had 90% stenosis in distal apical LAD. EF 55 with apical hypokinesis. Indefinite ASA and Plavix.  . Abdominal discomfort     Chronic N/V/D. Presumptive dx Crohn's dx per elevated p ANCA. Failed Entocort and Pentasa. Sep 2003 - ileocolectomy c anastomosis per Dr Deon Pilling 2/2 adhesions - path was hegative for Crohns. EGD, Sm bowel follow through (11/03), and an eteroclysis (10/03) were unrevealing. Cuases hypomag and hypocalcemia.  . Anemia     Multifactorial. Baseline HgB 10-11 ish. B12 def - 150 in 3/10. Fe Def - ferritin 35 3/10. Both are being repleted.  . Stroke [facility])     Incidental finding MRI 2002 L lacunar infarct  . Diabetes mellitus     Insulin dependent  . Allergy     Seasonal  . Chronic pain     CT 10/10 = Spinal stenosis L2 - S1.  Marland Kitchen Chronic kidney disease     Chronic renal insuff baseline Cr 1.2 - 1.4 ish.  . Adnexal mass 8/03    s/p lap BSO (R ovarian fibroma) & lysis of adhesions  . Hyperlipidemia     Managed with  both a statin and Welchol. Welchol stopped 2014 2/2 cost and started on fenofibrate   . Wears dentures     top  . RBBB   . Breast cancer ([place]) 03/16/13    right, 5 o'clock  . Hx of radiation therapy 06/02/13- 07/16/13    right rbeast 4500 cGy 25 sessions, right breast boost 1600 cGy in 8 sessions  . Lupus [facility])     Past Surgical History  Procedure Laterality Date  . Ptca  4/06  . Bowel resection  2003    ileocolectomy with anastomosis 2/2 adhesions  . Cholecystectomy    . Hernia repair      Ventral hernia repair  . Bilateral salpingoophorectomy  8/03    Lap BSO (R ovarian fibroma) and adhesion lysis  . Hemicolectomy      R sided hemicolectomy  . Cardiac catheterization      2 stents  . Abdominal hysterectomy    . Breast lumpectomy with needle localization and axillary sentinel lymph node bx Right 04/22/2013    Procedure: BREAST LUMPECTOMY WITH NEEDLE LOCALIZATION AND AXILLARY SENTINEL LYMPH NODE BX;  Surgeon: Stark Klein, MD;  Location: [facility];  Service: General;  Laterality: Right;    There were no vitals filed for this visit.  Visit Diagnosis:  Lymphedema  of breast - Plan: PT plan of care cert/re-cert      Subjective Assessment - 06/08/15 0752    Subjective I thought the changes were just part of the surgery    Pertinent History right lumpectomy followed by  radiation.  no chemotherapy    Patient Stated Goals find out what she can do to help her breast    Currently in Pain? No/denies            [facility] PT Assessment - 06/08/15 0001    Assessment   Medical Diagnosis breast cancer    Referring Provider Dr [name]   Onset Date/Surgical Date 05/12/13   Hand Dominance Right   Precautions   Precautions Other (comment)   Precaution Comments previous cancer and radiation    Restrictions   Weight Bearing Restrictions No   Balance Screen   Has the patient fallen in the past 6 months No   Has the patient had a decrease in activity level because of a  fear of falling?  No   Is the patient reluctant to leave their home because of a fear of falling?  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    Leisure pt does water aerobics once a week and walks on treadmill twice a week    Cognition   Overall Cognitive Status Within Functional Limits for tasks assessed   Observation/Other Assessments   Observations pt has darkening of right breast with dryness of skin and fibrosis at medial, distal and infererior aspect of breast    Skin Integrity intact    Other Surveys  Other Surveys  Lymphedema Life Impact    Observation/Other Assessments-Edema    Edema --  edema with moderate lymphostatic fibrosis in right breast    Sensation   Light Touch Appears Intact   Coordination   Gross Motor Movements are Fluid and Coordinated Yes   AROM   Right Shoulder Flexion 150 Degrees   Right Shoulder ABduction 150 Degrees   Left Shoulder Flexion 165 Degrees   Left Shoulder ABduction 158 Degrees   Transfers   Transfers Independent with all Transfers           LYMPHEDEMA/ONCOLOGY QUESTIONNAIRE - 06/07/15 1621    Right Upper Extremity Lymphedema   10 cm Proximal to Olecranon Process 34 cm   Olecranon Process 26 cm   10 cm Proximal to Ulnar Styloid Process 22 cm   Just Proximal to Ulnar Styloid Process 15.7 cm   Across Hand at [employer] 20 cm   At [place] of 2nd Digit 6.5 cm   Left Upper Extremity Lymphedema   10 cm Proximal to Olecranon Process 35 cm   Olecranon Process 26.5 cm   10 cm Proximal to Ulnar Styloid Process 23.5 cm   Just Proximal to Ulnar Styloid Process 16.5 cm   Across Hand at [employer] 20.1 cm   At [place] of 2nd Digit 6.5 cm                [facility] Adult PT Treatment/Exercise - 06/08/15 0001    Self-Care   Self-Care Other Self-Care Comments   Other Self-Care Comments  issued a piece of lined foam in  stockinette to wear inside bra to see if she has had reduction in breas fibrosis    Manual Therapy   Manual Therapy Manual Lymphatic Drainage (MLD)  Manual Lymphatic Drainage (MLD) very brief introduction to diphragmatic breathing with breast and abdominal skin stretches to encourage lymph flow from breast  used mirror to assist with education                 PT Education - 06/08/15 0752    Education provided Yes   Education Details brief abdomnal and breast manual lymph drainage, encouraged pt to go to [employer] fo rcompression brea and possible swell spot for the right breast. Asked her to wear the lined foam inside  her current bra to see if it makes a difference    Person(s) Educated Patient   Methods Explanation;Demonstration   Comprehension Verbalized understanding                [place] - 06/08/15 0755    CC Long Term Goal  #1   Title pt will report she knows how to manage her breast lymphedema    Time 4   Period Weeks   Status New            Plan - 07-04-15 1650    Clinical Impression Statement 70 yo female with moderate lymphostatic fibroses in medial, distal and inferior portions of breast after lumpectomy.  She will benefit from self manual lymph drainage and compression bra    Pt will benefit from skilled therapeutic intervention in order to improve on the following deficits Increased edema   Rehab Potential Excellent   Clinical Impairments Affecting Rehab Potential previous radiation    PT Frequency --  1-2 visit    PT Duration 4 weeks   PT Treatment/Interventions Patient/family education;Manual lymph drainage;Compression bandaging   PT Next Visit Plan assess use of foam and compression bra.  Review manual lymph drainage in supine and sidelyin.  Discharge if pt feels independent If so, redo LLIS   Consulted and Agree with Plan of Care Patient          G-Codes - 04-Jul-2015 0755    Functional Assessment Tool Used Lymphedema  Life Impact Scale 7 or 10% impaired    Functional Limitation Self care   Self Care Current Status (O8786) At least 1 percent but less than 20 percent impaired, limited or restricted   Self Care Goal Status (V6720) At least 1 percent but less than 20 percent impaired, limited or restricted       Problem List Patient Active Problem List   Diagnosis Date Noted  . Estrogen deficiency 06/04/2015  . Anemia in chronic kidney disease 06/04/2015  . Gout 04/11/2015  . Diabetic neuropathy associated with type 2 diabetes mellitus ([place]) 01/06/2015  . Controlled diabetes mellitus with neurologic complication, with long-term current use of insulin ([place]) 01/06/2015  . Lymphedema of breast 12/07/2014  . Long-term use of high-risk medication 12/07/2014  . Fat necrosis (segmental) of breast 12/07/2014  . Breast cancer of lower-inner quadrant of right female breast ([place]) 03/18/2013  . Chronic venous insufficiency 01/06/2013  . Health care maintenance 05/08/2011  . DM (diabetes mellitus), type 2, uncontrolled, with renal complications ([place]) 94/70/9628  . Seasonal allergies 05/03/2010  . Hypertension   . CAD (coronary artery disease)   . Abdominal discomfort   . Anemia   . History of stroke without residual deficits   . Hyperlipidemia associated with type 2 diabetes mellitus ([place])   . Chronic pain   . Chronic kidney disease, stage III (moderate)   . Adnexal mass    [name], PT  06/08/2015, 7:58  [place], [place], [address] Phone: [phone]   Fax:  [phone]  Name: [name] MRN: [id number] Date of Birth: 06-04-1945

## 2015-06-09 ENCOUNTER — Telehealth: Payer: Self-pay | Admitting: Pharmacist

## 2015-06-10 ENCOUNTER — Telehealth: Payer: Self-pay | Admitting: Internal Medicine

## 2015-06-10 NOTE — Telephone Encounter (Signed)
APPT. REMINDER CALL, LMTCB °

## 2015-06-13 ENCOUNTER — Ambulatory Visit (INDEPENDENT_AMBULATORY_CARE_PROVIDER_SITE_OTHER): Payer: Medicare Other | Admitting: *Deleted

## 2015-06-13 DIAGNOSIS — N189 Chronic kidney disease, unspecified: Secondary | ICD-10-CM

## 2015-06-13 DIAGNOSIS — D631 Anemia in chronic kidney disease: Secondary | ICD-10-CM | POA: Diagnosis not present

## 2015-06-13 NOTE — Telephone Encounter (Signed)
Called patient to offer assistance with medications. Unable to reach.

## 2015-06-14 MED ORDER — CYANOCOBALAMIN 1000 MCG/ML IJ SOLN
1000.0000 ug | Freq: Once | INTRAMUSCULAR | Status: AC
  Administered 2015-06-13: 1000 ug via INTRAMUSCULAR

## 2015-06-21 ENCOUNTER — Ambulatory Visit: Payer: Medicare Other | Attending: General Surgery | Admitting: Physical Therapy

## 2015-06-21 DIAGNOSIS — I890 Lymphedema, not elsewhere classified: Secondary | ICD-10-CM | POA: Insufficient documentation

## 2015-06-21 NOTE — Therapy (Signed)
[place], [place], [address] Phone: [phone]   Fax:  [phone]  Physical Therapy Treatment  Patient Details  Name: [name] MRN: [id number] Date of Birth: 1945/12/07 Referring Provider: Dr [name]  Encounter Date: 06/21/2015      PT End of Session - 06/21/15 1645    Visit Number 2   Number of Visits 3   Date for PT Re-Evaluation 07/08/15   PT Start Time 1700   PT Stop Time 1620  pt had no futher needs    PT Time Calculation (min) 15 min   Activity Tolerance Patient tolerated treatment well   Behavior During Therapy [facility] for tasks assessed/performed      Past Medical History  Diagnosis Date  . Hypertension     2006 B renal arteries patent. 2003 MRA - no RAS. 2003 pheo W/U Dr Hassell Done reportedly negative.  Marland Kitchen CAD (coronary artery disease) 1996    1996 - PTCA and angioplasty diagonal branch. 2000 - Rotoblator & angiopllasty of diagonal. 2006 - subendocardial AMI, DES to proximal LAD.Marland Kitchen Also had 90% stenosis in distal apical LAD. EF 55 with apical hypokinesis. Indefinite ASA and Plavix.  . Abdominal discomfort     Chronic N/V/D. Presumptive dx Crohn's dx per elevated p ANCA. Failed Entocort and Pentasa. Sep 2003 - ileocolectomy c anastomosis per Dr Deon Pilling 2/2 adhesions - path was hegative for Crohns. EGD, Sm bowel follow through (11/03), and an eteroclysis (10/03) were unrevealing. Cuases hypomag and hypocalcemia.  . Anemia     Multifactorial. Baseline HgB 10-11 ish. B12 def - 150 in 3/10. Fe Def - ferritin 35 3/10. Both are being repleted.  . Stroke [facility])     Incidental finding MRI 2002 L lacunar infarct  . Diabetes mellitus     Insulin dependent  . Allergy     Seasonal  . Chronic pain     CT 10/10 = Spinal stenosis L2 - S1.  Marland Kitchen Chronic kidney disease     Chronic renal insuff baseline Cr 1.2 - 1.4 ish.  . Adnexal mass 8/03    s/p lap BSO (R ovarian fibroma) & lysis of adhesions  .  Hyperlipidemia     Managed with both a statin and Welchol. Welchol stopped 2014 2/2 cost and started on fenofibrate   . Wears dentures     top  . RBBB   . Breast cancer ([place]) 03/16/13    right, 5 o'clock  . Hx of radiation therapy 06/02/13- 07/16/13    right rbeast 4500 cGy 25 sessions, right breast boost 1600 cGy in 8 sessions  . Lupus [facility])     Past Surgical History  Procedure Laterality Date  . Ptca  4/06  . Bowel resection  2003    ileocolectomy with anastomosis 2/2 adhesions  . Cholecystectomy    . Hernia repair      Ventral hernia repair  . Bilateral salpingoophorectomy  8/03    Lap BSO (R ovarian fibroma) and adhesion lysis  . Hemicolectomy      R sided hemicolectomy  . Cardiac catheterization      2 stents  . Abdominal hysterectomy    . Breast lumpectomy with needle localization and axillary sentinel lymph node bx Right 04/22/2013    Procedure: BREAST LUMPECTOMY WITH NEEDLE LOCALIZATION AND AXILLARY SENTINEL LYMPH NODE BX;  Surgeon: Stark Klein, MD;  Location: [place];  Service: General;  Laterality: Right;    There were no vitals filed for  this visit.  Visit Diagnosis:  Lymphedema of breast  Lymphedema      Subjective Assessment - June 26, 2015 1641    Subjective "Its getting better."  Pt states she has been doing her manual lymph drainage and has no problems with it.  She has an appoinment at [employer] to [place] tomorrow for compression bras.  She has been wearing the foam patch and feels it has been helping with the firmness   Pertinent History right lumpectomy followed by  radiation.  no chemotherapy    Patient Stated Goals find out what she can do to help her breast    Currently in Pain? No/denies                         [facility] Adult PT Treatment/Exercise - 2015/06/26 0001    Self-Care   Other Self-Care Comments  showed patient seveal types of bras and excouraged her to look for one that has a deep side to offer lateral chest support  and provided come compression to breast.  She ackowledged understanding                         [facility] Goals - 2015/06/26 1617    CC Long Term Goal  #1   Title pt will report she knows how to manage her breast lymphedema    Status Achieved   CC Long Term Goal  #2   Title report overall pain decrased >/=75% to tolerate daily tasks with less pain   Status Achieved            Plan - 06/26/15 1645    Clinical Impression Statement Pt reports she is doing fine with her manual lymph drainage and does not need further review. She received infomatoin about compression bras and will get them tomorrow.  She does not feel she needs further PT at this time    Clinical Impairments Affecting Rehab Potential previous radiation    PT Next Visit Plan .  Discharge i   Consulted and Agree with Plan of Care Patient          G-Codes - 2015-06-26 1648    Functional Assessment Tool Used clincial judgement    Functional Limitation Self care   Self Care Goal Status [phone]) At least 1 percent but less than 20 percent impaired, limited or restricted   Self Care Discharge Status [phone]) At least 1 percent but less than 20 percent impaired, limited or restricted      Problem List Patient Active Problem List   Diagnosis Date Noted  . Estrogen deficiency 06/04/2015  . Anemia in chronic kidney disease 06/04/2015  . Gout 04/11/2015  . Diabetic neuropathy associated with type 2 diabetes mellitus ([place]) 01/06/2015  . Controlled diabetes mellitus with neurologic complication, with long-term current use of insulin ([place]) 01/06/2015  . Lymphedema of breast 12/07/2014  . Long-term use of high-risk medication 12/07/2014  . Fat necrosis (segmental) of breast 12/07/2014  . Breast cancer of lower-inner quadrant of right female breast ([place]) 03/18/2013  . Chronic venous insufficiency 01/06/2013  . Health care maintenance 05/08/2011  . DM (diabetes mellitus), type 2, uncontrolled, with renal  complications ([place]) 29/79/8921  . Seasonal allergies 05/03/2010  . Hypertension   . CAD (coronary artery disease)   . Abdominal discomfort   . Anemia   . History of stroke without residual deficits   . Hyperlipidemia associated with type 2 diabetes mellitus ([place])   .  Chronic pain   . Chronic kidney disease, stage III (moderate)   . Adnexal mass      PHYSICAL THERAPY DISCHARGE SUMMARY  Visits from Start of Care: 2 Current functional level related to goals / functional outcomes: Pt fells independent in management of breast lymphedema   Remaining deficits: Some remaining fibrosis   Education / Equipment: Self manual lymph drainage and what to look for in compression bras Plan: Patient agrees to discharge.  Patient goals were met. Patient is being discharged due to meeting the stated rehab goals.  ?????          Donato Heinz. Owens Shark PT  Norwood Levo 06/21/2015, 4:49 PM  [place], [place], [address] Phone: [phone]   Fax:  [phone]  Name: [name] MRN: [id number] Date of Birth: [birth year]

## 2015-06-23 ENCOUNTER — Telehealth: Payer: Self-pay

## 2015-06-23 NOTE — Telephone Encounter (Signed)
Faxed signed order dated 06-22-15 to [facility] [place].  Sent a copy to HIM to be scanned into patient's EMR.

## 2015-06-27 ENCOUNTER — Telehealth: Payer: Self-pay

## 2015-06-27 NOTE — Telephone Encounter (Signed)
Faxed signed orders dated 06-27-15 to [facility] [place].  Sent a copy to HIM to be scanned into patient's EMR.

## 2015-07-08 ENCOUNTER — Telehealth: Payer: Self-pay | Admitting: Internal Medicine

## 2015-07-08 ENCOUNTER — Other Ambulatory Visit: Payer: Self-pay | Admitting: Internal Medicine

## 2015-07-08 NOTE — Telephone Encounter (Signed)
APPT. REMINDER CALL, LMTCB °

## 2015-07-11 ENCOUNTER — Telehealth: Payer: Self-pay | Admitting: *Deleted

## 2015-07-11 ENCOUNTER — Other Ambulatory Visit: Payer: Self-pay | Admitting: *Deleted

## 2015-07-11 ENCOUNTER — Ambulatory Visit (INDEPENDENT_AMBULATORY_CARE_PROVIDER_SITE_OTHER): Payer: Medicare Other | Admitting: *Deleted

## 2015-07-11 DIAGNOSIS — D519 Vitamin B12 deficiency anemia, unspecified: Secondary | ICD-10-CM | POA: Diagnosis not present

## 2015-07-11 MED ORDER — CYANOCOBALAMIN 1000 MCG/ML IJ SOLN
1000.0000 ug | Freq: Once | INTRAMUSCULAR | Status: AC
  Administered 2015-07-11: 1000 ug via INTRAMUSCULAR

## 2015-07-11 MED ORDER — CYANOCOBALAMIN 1000 MCG/ML IJ SOLN: 1000.0000 ug | Freq: Once | INTRAMUSCULAR | Status: DC

## 2015-07-11 MED ORDER — HYDROXYZINE PAMOATE 50 MG PO CAPS: 50.0000 mg | ORAL_CAPSULE | Freq: Every evening | ORAL | Status: DC | PRN

## 2015-07-12 ENCOUNTER — Telehealth: Payer: Self-pay | Admitting: *Deleted

## 2015-07-12 NOTE — Telephone Encounter (Signed)
Spoke to patient to day needs refill for Vistaril.  Message to [facility] about the need for a refill.  Sander Nephew, RN 07/11/2015 4:30 PM

## 2015-07-12 NOTE — Telephone Encounter (Signed)
Spoke with patient today.  Doing well.  Taking meds.  No problems.  Graciella Freer, RN

## 2015-07-21 ENCOUNTER — Ambulatory Visit (INDEPENDENT_AMBULATORY_CARE_PROVIDER_SITE_OTHER): Payer: Medicare Other | Admitting: Internal Medicine

## 2015-07-21 ENCOUNTER — Encounter: Payer: Self-pay | Admitting: Internal Medicine

## 2015-07-21 VITALS — BP 137/56 | HR 66 | Temp 98.1°F | Wt 186.8 lb

## 2015-07-21 DIAGNOSIS — E1142 Type 2 diabetes mellitus with diabetic polyneuropathy: Secondary | ICD-10-CM

## 2015-07-21 DIAGNOSIS — E784 Other hyperlipidemia: Secondary | ICD-10-CM

## 2015-07-21 DIAGNOSIS — Z79899 Other long term (current) drug therapy: Secondary | ICD-10-CM

## 2015-07-21 DIAGNOSIS — E1169 Type 2 diabetes mellitus with other specified complication: Secondary | ICD-10-CM

## 2015-07-21 DIAGNOSIS — Z794 Long term (current) use of insulin: Secondary | ICD-10-CM | POA: Diagnosis not present

## 2015-07-21 DIAGNOSIS — Z7982 Long term (current) use of aspirin: Secondary | ICD-10-CM

## 2015-07-21 DIAGNOSIS — I2510 Atherosclerotic heart disease of native coronary artery without angina pectoris: Secondary | ICD-10-CM

## 2015-07-21 DIAGNOSIS — E1129 Type 2 diabetes mellitus with other diabetic kidney complication: Secondary | ICD-10-CM | POA: Diagnosis not present

## 2015-07-21 DIAGNOSIS — D519 Vitamin B12 deficiency anemia, unspecified: Secondary | ICD-10-CM | POA: Diagnosis not present

## 2015-07-21 DIAGNOSIS — E1122 Type 2 diabetes mellitus with diabetic chronic kidney disease: Secondary | ICD-10-CM

## 2015-07-21 DIAGNOSIS — E785 Hyperlipidemia, unspecified: Secondary | ICD-10-CM

## 2015-07-21 DIAGNOSIS — I10 Essential (primary) hypertension: Secondary | ICD-10-CM

## 2015-07-21 DIAGNOSIS — E1165 Type 2 diabetes mellitus with hyperglycemia: Secondary | ICD-10-CM

## 2015-07-21 DIAGNOSIS — N289 Disorder of kidney and ureter, unspecified: Secondary | ICD-10-CM

## 2015-07-21 DIAGNOSIS — N183 Chronic kidney disease, stage 3 (moderate): Secondary | ICD-10-CM

## 2015-07-21 DIAGNOSIS — IMO0002 Reserved for concepts with insufficient information to code with codable children: Secondary | ICD-10-CM

## 2015-07-21 DIAGNOSIS — D649 Anemia, unspecified: Secondary | ICD-10-CM

## 2015-07-21 DIAGNOSIS — J302 Other seasonal allergic rhinitis: Secondary | ICD-10-CM

## 2015-07-21 LAB — GLUCOSE, CAPILLARY: GLUCOSE-CAPILLARY: 127 mg/dL — AB (ref 65–99)

## 2015-07-21 LAB — POCT GLYCOSYLATED HEMOGLOBIN (HGB A1C): HEMOGLOBIN A1C: 7.4

## 2015-07-21 MED ORDER — POTASSIUM CHLORIDE ER 10 MEQ PO TBCR: 20.0000 meq | EXTENDED_RELEASE_TABLET | Freq: Every day | ORAL | Status: DC

## 2015-07-21 NOTE — Progress Notes (Signed)
   Subjective:    Patient ID: [name], female    DOB: 29-Apr-1945, 70 y.o.   MRN: [id number]  HPI  [name] is here for DM F/U. Please see the A&P for the status of the pt's chronic medical problems.  ROS : per ROS section and in problem oriented charting. All other systems are negative. PMHx, Soc hx, and / or Fam hx : Doing Zumba 3-4 times weekly with her sister and daughter. Works at [employer]:       No allergy sxs except at night when gets head stuffiness  Cardiovascular: Negative for leg swelling.  Endocrine:       No hypoglycemic sxs       Objective:   Physical Exam  Constitutional: She is oriented to person, place, and time. She appears well-developed. No distress.  HENT:  Head: Normocephalic and atraumatic.  Right Ear: External ear normal.  Left Ear: External ear normal.  Nose: Nose normal.  Eyes: Conjunctivae and EOM are normal.  Cardiovascular: Normal rate, regular rhythm and normal heart sounds.   No murmur heard. Pulmonary/Chest: Effort normal and breath sounds normal. No respiratory distress. She has no wheezes.  Musculoskeletal: Normal range of motion. She exhibits edema.  Trace edema B  Neurological: She is alert and oriented to person, place, and time.  Skin: Skin is warm and dry. She is not diaphoretic.  Psychiatric: She has a normal mood and affect. Her behavior is normal. Judgment and thought content normal.          Assessment & Plan:

## 2015-07-21 NOTE — Patient Instructions (Signed)
1. Continue the Zomba 2. You are doing well on your sugar. When you have a high sugar, think about which food might have caused it 3. I will mail you your blood results 4. I sent in the potassium refill

## 2015-07-22 ENCOUNTER — Encounter: Payer: Self-pay | Admitting: Internal Medicine

## 2015-07-22 LAB — LIPID PANEL
Chol/HDL Ratio: 3.9 ratio units (ref 0.0–4.4)
Cholesterol, Total: 183 mg/dL (ref 100–199)
HDL: 47 mg/dL (ref >39)
LDL CALC: 88 mg/dL (ref 0–99)
TRIGLYCERIDES: 239 mg/dL — AB (ref 0–149)
VLDL CHOLESTEROL CAL: 48 mg/dL — AB (ref 5–40)

## 2015-07-22 LAB — VITAMIN B12: Vitamin B-12: 1518 pg/mL — ABNORMAL HIGH (ref 211–946)

## 2015-07-22 LAB — BMP8+ANION GAP
Anion Gap: 26 mmol/L — ABNORMAL HIGH (ref 10.0–18.0)
BUN/Creatinine Ratio: 25 (ref 12–28)
BUN: 40 mg/dL — ABNORMAL HIGH (ref 8–27)
CHLORIDE: 101 mmol/L (ref 96–106)
CO2: 19 mmol/L (ref 18–29)
CREATININE: 1.6 mg/dL — AB (ref 0.57–1.00)
Calcium: 10.1 mg/dL (ref 8.7–10.3)
GFR calc Af Amer: 38 mL/min/{1.73_m2} — ABNORMAL LOW (ref >59)
GFR calc non Af Amer: 33 mL/min/{1.73_m2} — ABNORMAL LOW (ref >59)
Glucose: 130 mg/dL — ABNORMAL HIGH (ref 65–99)
Potassium: 4.1 mmol/L (ref 3.5–5.2)
Sodium: 146 mmol/L — ABNORMAL HIGH (ref 134–144)

## 2015-07-22 LAB — FERRITIN: Ferritin: 78 ng/mL (ref 15–150)

## 2015-07-22 NOTE — Assessment & Plan Note (Signed)
Good except at night when she gets head stuffiness. Takes nasonex PRN. Benedryl very rarely. Afrin even more rarely.  PLAN : may increase nasonex to daily if ned better control

## 2015-07-22 NOTE — Assessment & Plan Note (Signed)
BP Readings from Last 3 Encounters:  07/21/15 137/56  06/02/15 145/62  04/18/15 129/55   Her BP was in range on recheck.   PLAN : Cont lisinopril 20 1/2 QD Norvasc 10 Atenolol 5 Lasix 20 2 QAM

## 2015-07-22 NOTE — Assessment & Plan Note (Signed)
She is on atorva and fenofibrate. No SE. FLP today  PLAN : cont meds

## 2015-07-22 NOTE — Assessment & Plan Note (Signed)
A1C 7.5 to 7.4 today. On Levemir 36 and prandin 2 BID. 79% of fasting CBG's in range. I cannot titrate up insulin bc several fasting are < 100. There are sporadic fasting in 170's and one at 238. This is likely dietary related and I asked her to watch for connection btw food and fasting CBG  PLAN:  Cont current meds

## 2015-07-22 NOTE — Assessment & Plan Note (Signed)
She has no blood in her stools. She remains on her FeSO4 and monthly B 12 shots. We discussed that her ferritin had not been checked since 2015 (was 13) and she was agreeable to checking it. I discussed pros and cons of checking B 12 and she preferred to have it checked. Dr [name] had requested these last appt and I asked Ms [place]ardot to not et retested when she returns to [place].  PLAN : Vit B 12 (1518) and ferritin (78)

## 2015-07-22 NOTE — Assessment & Plan Note (Signed)
She is exercising 3-4 times weekly. No sxs  PLAN : cont AS, BB, statin

## 2015-08-01 DIAGNOSIS — E119 Type 2 diabetes mellitus without complications: Secondary | ICD-10-CM | POA: Diagnosis not present

## 2015-08-01 LAB — HM DIABETES EYE EXAM

## 2015-08-03 ENCOUNTER — Telehealth: Payer: Self-pay | Admitting: *Deleted

## 2015-08-03 NOTE — Telephone Encounter (Signed)
Called MS Krohn. No answer. Had podagra in past and Rx colchicine. Not clear from message if gout today. Can try colchicine 0.6 pills - take 2 now, one pill in one hour, and then one pill daily until resolved. If no better by tomorrow, would need appt.

## 2015-08-03 NOTE — Telephone Encounter (Signed)
RTC from patient given instructions per Dr. Lynnae January to take Colchicine 0.6 mg tablets 2 now and 1 in 1 hour and then daily and to schedule an appointment to come to the Clinics if no relief by tomorrow.  Sander Nephew, RN 08/03/2015 4;56 PM

## 2015-08-03 NOTE — Telephone Encounter (Addendum)
Call to patient message left that Dr. Lynnae January had called her.  Unable to reach patient.  Message left that Dr. Jackquline Denmark for her to Take Colchicine 0.6 mg tablet 2 now then 1 in 1 hour and then daily.  If no improvement by tomorrow patient is to come in for an appointment.  Sander Nephew, RN 08/03/2015 3:40 PM

## 2015-08-03 NOTE — Telephone Encounter (Signed)
Call from patient stating that she is again having some swelling and pain in her foot.  Thinks it may be her Gout returning.  Said that foot had a lump on the top.  Is not red or hot.  Painful to walk on.  Patient also mentioned that at her last visit was told not to take anymore of the Gout medication.  Informed patient that I would get a message to Dr. Lynnae January.  Sander Nephew, RN 08/03/2015 9:45 AM

## 2015-08-08 ENCOUNTER — Ambulatory Visit (INDEPENDENT_AMBULATORY_CARE_PROVIDER_SITE_OTHER): Payer: Medicare Other | Admitting: *Deleted

## 2015-08-08 DIAGNOSIS — N189 Chronic kidney disease, unspecified: Secondary | ICD-10-CM | POA: Diagnosis not present

## 2015-08-08 DIAGNOSIS — D631 Anemia in chronic kidney disease: Secondary | ICD-10-CM | POA: Diagnosis not present

## 2015-08-08 MED ORDER — CYANOCOBALAMIN 1000 MCG/ML IJ SOLN
1000.0000 ug | INTRAMUSCULAR | Status: DC
  Administered 2015-08-08: 1000 ug via INTRAMUSCULAR

## 2015-08-12 ENCOUNTER — Encounter: Payer: Self-pay | Admitting: *Deleted

## 2015-08-29 ENCOUNTER — Other Ambulatory Visit: Payer: Medicare Other

## 2015-08-30 DIAGNOSIS — C50911 Malignant neoplasm of unspecified site of right female breast: Secondary | ICD-10-CM | POA: Diagnosis not present

## 2015-09-05 ENCOUNTER — Ambulatory Visit: Payer: Medicare Other

## 2015-09-19 ENCOUNTER — Telehealth: Payer: Self-pay

## 2015-09-19 ENCOUNTER — Other Ambulatory Visit: Payer: Self-pay | Admitting: Internal Medicine

## 2015-09-19 DIAGNOSIS — N183 Chronic kidney disease, stage 3 unspecified: Secondary | ICD-10-CM

## 2015-09-19 DIAGNOSIS — I2510 Atherosclerotic heart disease of native coronary artery without angina pectoris: Secondary | ICD-10-CM

## 2015-09-19 DIAGNOSIS — E1122 Type 2 diabetes mellitus with diabetic chronic kidney disease: Secondary | ICD-10-CM

## 2015-09-19 DIAGNOSIS — IMO0002 Reserved for concepts with insufficient information to code with codable children: Secondary | ICD-10-CM

## 2015-09-19 DIAGNOSIS — E1165 Type 2 diabetes mellitus with hyperglycemia: Secondary | ICD-10-CM

## 2015-09-19 DIAGNOSIS — Z794 Long term (current) use of insulin: Principal | ICD-10-CM

## 2015-09-19 NOTE — Telephone Encounter (Signed)
Pt needs to speak with a nurse regarding fenofibrate med. Please call back.

## 2015-09-21 MED ORDER — FENOFIBRATE 54 MG PO TABS: 54.0000 mg | ORAL_TABLET | Freq: Every day | ORAL | Status: DC

## 2015-09-21 NOTE — Telephone Encounter (Signed)
Done. thanks

## 2015-09-21 NOTE — Telephone Encounter (Signed)
Patient requesting that Tricor be changed from 48 mg to 54 mg. If possible. Says that insurance will pay for the 54 mg., but mot 48 mg. Thanks!

## 2015-09-22 NOTE — Telephone Encounter (Signed)
LVM to return call.

## 2015-09-22 NOTE — Telephone Encounter (Signed)
Notified patient of change. Thanks!

## 2015-09-26 ENCOUNTER — Other Ambulatory Visit: Payer: Self-pay | Admitting: Oncology

## 2015-09-26 DIAGNOSIS — C50311 Malignant neoplasm of lower-inner quadrant of right female breast: Secondary | ICD-10-CM

## 2015-10-02 ENCOUNTER — Ambulatory Visit (HOSPITAL_COMMUNITY)
Admission: EM | Admit: 2015-10-02 | Discharge: 2015-10-02 | Disposition: A | Discharge status: Home / Self Care | Payer: Medicare Other | Attending: Family Medicine | Admitting: Family Medicine

## 2015-10-02 ENCOUNTER — Encounter (HOSPITAL_COMMUNITY): Payer: Self-pay | Admitting: Emergency Medicine

## 2015-10-02 DIAGNOSIS — R197 Diarrhea, unspecified: Secondary | ICD-10-CM | POA: Diagnosis not present

## 2015-10-02 NOTE — ED Provider Notes (Signed)
CSN: [id number]     Arrival date & time 10/02/15  1336 History   First MD Initiated Contact with Patient 10/02/15 1547     Chief Complaint  Patient presents with  . Diarrhea   (Consider location/radiation/quality/duration/timing/severity/associated sxs/prior Treatment) Patient is a 70 y.o. female presenting with diarrhea. The history is provided by the patient. No language interpreter was used.  Diarrhea Quality:  Watery Severity:  Moderate Onset quality:  Gradual Number of episodes:  Everytime she eats anything it comes right out Duration:  6 days Timing:  Constant Progression:  Worsening Relieved by:  Nothing Worsened by:  Nothing tried Ineffective treatments:  Anti-motility medications (She used imodium with no improvement) Associated symptoms: no arthralgias, no chills, no recent cough, no fever and no vomiting   Associated symptoms comment:  Belly pain only when she is about to use the bathroom Risk factors: no recent antibiotic use, no sick contacts, no suspicious food intake and no travel to endemic areas     Past Medical History  Diagnosis Date  . Hypertension     2006 B renal arteries patent. 2003 MRA - no RAS. 2003 pheo W/U Dr Hassell Done reportedly negative.  Marland Kitchen CAD (coronary artery disease) 1996    1996 - PTCA and angioplasty diagonal branch. 2000 - Rotoblator & angiopllasty of diagonal. 2006 - subendocardial AMI, DES to proximal LAD.Marland Kitchen Also had 90% stenosis in distal apical LAD. EF 55 with apical hypokinesis. Indefinite ASA and Plavix.  . Abdominal discomfort     Chronic N/V/D. Presumptive dx Crohn's dx per elevated p ANCA. Failed Entocort and Pentasa. Sep 2003 - ileocolectomy c anastomosis per Dr Deon Pilling 2/2 adhesions - path was hegative for Crohns. EGD, Sm bowel follow through (11/03), and an eteroclysis (10/03) were unrevealing. Cuases hypomag and hypocalcemia.  . Anemia     Multifactorial. Baseline HgB 10-11 ish. B12 def - 150 in 3/10. Fe Def - ferritin 35 3/10. Both are being  repleted.  . Stroke [facility])     Incidental finding MRI 2002 L lacunar infarct  . Diabetes mellitus     Insulin dependent  . Allergy     Seasonal  . Chronic pain     CT 10/10 = Spinal stenosis L2 - S1.  Marland Kitchen Chronic kidney disease     Chronic renal insuff baseline Cr 1.2 - 1.4 ish.  . Adnexal mass 8/03    s/p lap BSO (R ovarian fibroma) & lysis of adhesions  . Hyperlipidemia     Managed with both a statin and Welchol. Welchol stopped 2014 2/2 cost and started on fenofibrate   . Wears dentures     top  . RBBB   . Breast cancer ([place]) 03/16/13    right, 5 o'clock  . Hx of radiation therapy 06/02/13- 07/16/13    right rbeast 4500 cGy 25 sessions, right breast boost 1600 cGy in 8 sessions  . Lupus [facility])    Past Surgical History  Procedure Laterality Date  . Ptca  4/06  . Bowel resection  2003    ileocolectomy with anastomosis 2/2 adhesions  . Cholecystectomy    . Hernia repair      Ventral hernia repair  . Bilateral salpingoophorectomy  8/03    Lap BSO (R ovarian fibroma) and adhesion lysis  . Hemicolectomy      R sided hemicolectomy  . Cardiac catheterization      2 stents  . Abdominal hysterectomy    . Breast lumpectomy with needle localization and axillary sentinel lymph  node bx Right 04/22/2013    Procedure: BREAST LUMPECTOMY WITH NEEDLE LOCALIZATION AND AXILLARY SENTINEL LYMPH NODE BX;  Surgeon: Stark Klein, MD;  Location: [place];  Service: General;  Laterality: Right;   Family History  Problem Relation Age of Onset  . Pancreatic cancer Brother 63  . Cancer Mother   . Breast cancer Mother 64  . Hypertension Daughter   . Lung cancer Maternal Aunt   . Prostate cancer Maternal Uncle   . Heart attack Maternal Grandmother   . Kidney failure Maternal Aunt    Social History  Substance Use Topics  . Smoking status: Never Smoker   . Smokeless tobacco: Never Used  . Alcohol Use: No   OB History    No data available     Review of Systems  Constitutional:  Negative for fever and chills.  Respiratory: Negative.   Cardiovascular: Negative.   Gastrointestinal: Positive for diarrhea. Negative for vomiting.  Musculoskeletal: Negative.  Negative for arthralgias.  All other systems reviewed and are negative.   Allergies  Percocet; Diazepam; Haloperidol lactate; Lorazepam; Morphine sulfate; Propoxyphene hcl; and Tramadol hcl  Home Medications   Prior to Admission medications   Medication Sig Start Date End Date Taking? Authorizing Provider  amLODipine (NORVASC) 10 MG tablet Take 1 tablet by mouth  daily 09/20/15  Yes Bartholomew Crews, MD  anastrozole (ARIMIDEX) 1 MG tablet Take 1 tablet by mouth  daily 09/26/15  Yes Lennis Marion Downer, MD  aspirin EC 81 MG tablet Take 81 mg by mouth daily.   Yes Historical Provider, MD  atenolol (TENORMIN) 50 MG tablet Take 1 tablet by mouth two  times daily 09/20/15  Yes Bartholomew Crews, MD  atorvastatin (LIPITOR) 20 MG tablet TAKE ONE TABLET BY MOUTH ONCE DAILY AT BEDTIME 06/03/15  Yes Bartholomew Crews, MD  clopidogrel (PLAVIX) 75 MG tablet Take 1 tablet by mouth once a day 06/07/15  Yes Bartholomew Crews, MD  colchicine (COLCRYS) 0.6 MG tablet Take 1 tablet (0.6 mg total) by mouth daily. 04/11/15  Yes Alex Ronnie Derby, DO  desloratadine (CLARINEX) 5 MG tablet Take 5 mg by mouth daily as needed (allergies). Reported on 04/11/2015 04/10/13  Yes Historical Provider, MD  fenofibrate 54 MG tablet Take 1 tablet (54 mg total) by mouth daily. 09/21/15  Yes Bartholomew Crews, MD  ferrous fumarate (HEMOCYTE - 106 MG FE) 325 (106 FE) MG TABS tablet Take 1 tablet (106 mg of iron total) by mouth daily. 12/06/14  Yes Lennis Marion Downer, MD  furosemide (LASIX) 20 MG tablet Take 1 tablet by mouth two  times daily 06/07/15  Yes Bartholomew Crews, MD  gabapentin (NEURONTIN) 300 MG capsule Take 1 capsule by mouth at  bedtime 02/24/15  Yes Bartholomew Crews, MD  glucose blood (ONE TOUCH ULTRA TEST) test strip Use to check blood sugar 3  to 4 times daily. diag code E11.29. Insulin dependent 09/20/15  Yes Bartholomew Crews, MD  hydrOXYzine (VISTARIL) 50 MG capsule Take 1 capsule (50 mg total) by mouth at bedtime as needed. 07/11/15  Yes Bartholomew Crews, MD  LEVEMIR 100 UNIT/ML injection INJECT 36 UNITS SUBCUTANEOUSLY AT BEDTIME 07/10/15  Yes Bartholomew Crews, MD  lisinopril (PRINIVIL,ZESTRIL) 20 MG tablet Take 1 tablet (20 mg total) by mouth daily. 04/18/15  Yes Bartholomew Crews, MD  loperamide (IMODIUM) 2 MG capsule TAKE ONE CAPSULE BY MOUTH ONCE DAILY AS NEEDED FOR DIARRHEA OR LOOSE STOOLS 05/24/14  Yes Benjamine Mola  Lavonia Drafts, MD  nitroGLYCERIN (NITROSTAT) 0.4 MG SL tablet Place 1 tablet (0.4 mg total) under the tongue every 5 (five) minutes as needed for chest pain. 08/13/14  Yes Bartholomew Crews, MD  potassium chloride (K-DUR) 10 MEQ tablet Take 2 tablets (20 mEq total) by mouth daily. 07/21/15  Yes Bartholomew Crews, MD  repaglinide (PRANDIN) 2 MG tablet Take 1 tablet by mouth 3  times a day before meals 05/09/15  Yes Bartholomew Crews, MD  atorvastatin (LIPITOR) 20 MG tablet Take 1 tablet by mouth at  bedtime 09/20/15   Bartholomew Crews, MD   Meds Ordered and Administered this Visit  Medications - No data to display  BP 131/65 mmHg  Pulse 74  Temp(Src) 97.4 F (36.3 C) (Oral)  Resp 20  SpO2 98% No data found.   Physical Exam  Constitutional: She appears well-developed. No distress.  HENT:  No dehydrated  Cardiovascular: Normal rate, regular rhythm, normal heart sounds and intact distal pulses.   No murmur heard. Pulmonary/Chest: Effort normal and breath sounds normal. No respiratory distress. She has no wheezes.  Abdominal: Soft. Bowel sounds are normal. She exhibits no distension and no mass. There is no tenderness.  Skin: Skin is warm.  No dehydrated  Nursing note and vitals reviewed.   ED Course  Procedures (including critical care time)  Labs Review Labs Reviewed - No data to display  Imaging  Review No results found.   Visual Acuity Review  Right Eye Distance:   Left Eye Distance:   Bilateral Distance:    Right Eye Near:   Left Eye Near:    Bilateral Near:         MDM  No diagnosis found. Diarrhea, unspecified type  Patient reassured this is likely viral infection and should resolve in few days. Antibiotic is not recommended at this time. I recommended good oral hygiene. Keep well hydrated. May continue imodium as needed. If no improvement return to PCP soon for stool testing. She verbalized understanding.    Kinnie Feil, MD 10/02/15 [phone]

## 2015-10-02 NOTE — Discharge Instructions (Signed)
It was nice seeing you today. i am sorry about your diarrhea. The most common cause is virus. Please keep yourself well hydrated. No antibiotic is needed now. If symptoms persist you will need stool test. See [place] back soon.  Diarrhea Diarrhea is watery poop (stool). It can make you feel weak, tired, thirsty, or give you a dry mouth (signs of dehydration). Watery poop is a sign of another problem, most often an infection. It often lasts 2-3 days. It can last longer if it is a sign of something serious. Take care of yourself as told by your doctor. HOME CARE   Drink 1 cup (8 ounces) of fluid each time you have watery poop.  Do not drink the following fluids:  Those that contain simple sugars (fructose, glucose, galactose, lactose, sucrose, maltose).  Sports drinks.  Fruit juices.  Whole milk products.  Sodas.  Drinks with caffeine (coffee, tea, soda) or alcohol.  Oral rehydration solution may be used if the doctor says it is okay. You may make your own solution. Follow this recipe:   - teaspoon table salt.   teaspoon baking soda.   teaspoon salt substitute containing potassium chloride.  1 tablespoons sugar.  1 liter (34 ounces) of water.  Avoid the following foods:  High fiber foods, such as raw fruits and vegetables.  Nuts, seeds, and whole grain breads and cereals.   Those that are sweetened with sugar alcohols (xylitol, sorbitol, mannitol).  Try eating the following foods:  Starchy foods, such as rice, toast, pasta, low-sugar cereal, oatmeal, baked potatoes, crackers, and bagels.  Bananas.  Applesauce.  Eat probiotic-rich foods, such as yogurt and milk products that are fermented.  Wash your hands well after each time you have watery poop.  Only take medicine as told by your doctor.  Take a warm bath to help lessen burning or pain from having watery poop. GET HELP RIGHT AWAY IF:   You cannot drink fluids without throwing up (vomiting).  You keep throwing  up.  You have blood in your poop, or your poop looks black and tarry.  You do not pee (urinate) in 6-8 hours, or there is only a small amount of very dark pee.  You have belly (abdominal) pain that gets worse or stays in the same spot (localizes).  You are weak, dizzy, confused, or light-headed.  You have a very bad headache.  Your watery poop gets worse or does not get better.  You have a fever or lasting symptoms for more than 2-3 days.  You have a fever and your symptoms suddenly get worse. MAKE SURE YOU:   Understand these instructions.  Will watch your condition.  Will get help right away if you are not doing well or get worse.   This information is not intended to replace advice given to you by your health care provider. Make sure you discuss any questions you have with your health care provider.   Document Released: 08/22/2007 Document Revised: 03/26/2014 Document Reviewed: 11/11/2011 Elsevier Interactive Patient Education [employer].

## 2015-10-02 NOTE — ED Notes (Signed)
The patient presented to the [facility] with a complaint of diarrhea x 6 days. The patient stated that she has tried OTC immodium with no relief.

## 2015-10-03 ENCOUNTER — Ambulatory Visit (INDEPENDENT_AMBULATORY_CARE_PROVIDER_SITE_OTHER): Payer: Medicare Other | Admitting: Internal Medicine

## 2015-10-03 ENCOUNTER — Observation Stay (HOSPITAL_COMMUNITY)
Admission: AD | Admit: 2015-10-03 | Discharge: 2015-10-04 | Disposition: A | Discharge status: Home / Self Care | Payer: Medicare Other | Source: Ambulatory Visit | Attending: Internal Medicine | Admitting: Internal Medicine

## 2015-10-03 ENCOUNTER — Encounter: Payer: Self-pay | Admitting: Internal Medicine

## 2015-10-03 VITALS — BP 171/81 | HR 84 | Temp 98.1°F | Ht 65.0 in | Wt 179.2 lb

## 2015-10-03 DIAGNOSIS — Z923 Personal history of irradiation: Secondary | ICD-10-CM | POA: Insufficient documentation

## 2015-10-03 DIAGNOSIS — Z801 Family history of malignant neoplasm of trachea, bronchus and lung: Secondary | ICD-10-CM | POA: Insufficient documentation

## 2015-10-03 DIAGNOSIS — D649 Anemia, unspecified: Secondary | ICD-10-CM | POA: Diagnosis not present

## 2015-10-03 DIAGNOSIS — Z841 Family history of disorders of kidney and ureter: Secondary | ICD-10-CM | POA: Insufficient documentation

## 2015-10-03 DIAGNOSIS — M329 Systemic lupus erythematosus, unspecified: Secondary | ICD-10-CM | POA: Diagnosis not present

## 2015-10-03 DIAGNOSIS — R197 Diarrhea, unspecified: Secondary | ICD-10-CM

## 2015-10-03 DIAGNOSIS — N189 Chronic kidney disease, unspecified: Secondary | ICD-10-CM | POA: Diagnosis not present

## 2015-10-03 DIAGNOSIS — Z853 Personal history of malignant neoplasm of breast: Secondary | ICD-10-CM | POA: Insufficient documentation

## 2015-10-03 DIAGNOSIS — Z885 Allergy status to narcotic agent status: Secondary | ICD-10-CM | POA: Diagnosis not present

## 2015-10-03 DIAGNOSIS — Z803 Family history of malignant neoplasm of breast: Secondary | ICD-10-CM | POA: Insufficient documentation

## 2015-10-03 DIAGNOSIS — I2510 Atherosclerotic heart disease of native coronary artery without angina pectoris: Secondary | ICD-10-CM | POA: Insufficient documentation

## 2015-10-03 DIAGNOSIS — Z888 Allergy status to other drugs, medicaments and biological substances status: Secondary | ICD-10-CM | POA: Insufficient documentation

## 2015-10-03 DIAGNOSIS — Z9049 Acquired absence of other specified parts of digestive tract: Secondary | ICD-10-CM

## 2015-10-03 DIAGNOSIS — A088 Other specified intestinal infections: Secondary | ICD-10-CM | POA: Diagnosis not present

## 2015-10-03 DIAGNOSIS — Z800 Family history of malignant neoplasm of digestive organs: Secondary | ICD-10-CM | POA: Diagnosis not present

## 2015-10-03 DIAGNOSIS — Z7982 Long term (current) use of aspirin: Secondary | ICD-10-CM | POA: Insufficient documentation

## 2015-10-03 DIAGNOSIS — E1122 Type 2 diabetes mellitus with diabetic chronic kidney disease: Secondary | ICD-10-CM | POA: Diagnosis not present

## 2015-10-03 DIAGNOSIS — I4510 Unspecified right bundle-branch block: Secondary | ICD-10-CM | POA: Diagnosis not present

## 2015-10-03 DIAGNOSIS — Z794 Long term (current) use of insulin: Secondary | ICD-10-CM | POA: Diagnosis not present

## 2015-10-03 DIAGNOSIS — I129 Hypertensive chronic kidney disease with stage 1 through stage 4 chronic kidney disease, or unspecified chronic kidney disease: Secondary | ICD-10-CM | POA: Diagnosis not present

## 2015-10-03 DIAGNOSIS — Z8249 Family history of ischemic heart disease and other diseases of the circulatory system: Secondary | ICD-10-CM | POA: Diagnosis not present

## 2015-10-03 DIAGNOSIS — Z955 Presence of coronary angioplasty implant and graft: Secondary | ICD-10-CM | POA: Insufficient documentation

## 2015-10-03 DIAGNOSIS — G8929 Other chronic pain: Secondary | ICD-10-CM | POA: Insufficient documentation

## 2015-10-03 DIAGNOSIS — I252 Old myocardial infarction: Secondary | ICD-10-CM | POA: Diagnosis not present

## 2015-10-03 DIAGNOSIS — Z8042 Family history of malignant neoplasm of prostate: Secondary | ICD-10-CM | POA: Insufficient documentation

## 2015-10-03 DIAGNOSIS — Z8673 Personal history of transient ischemic attack (TIA), and cerebral infarction without residual deficits: Secondary | ICD-10-CM | POA: Diagnosis not present

## 2015-10-03 DIAGNOSIS — R531 Weakness: Secondary | ICD-10-CM | POA: Diagnosis not present

## 2015-10-03 DIAGNOSIS — E785 Hyperlipidemia, unspecified: Secondary | ICD-10-CM | POA: Insufficient documentation

## 2015-10-03 LAB — BASIC METABOLIC PANEL
Anion gap: 7 (ref 5–15)
BUN: 24 mg/dL — ABNORMAL HIGH (ref 6–20)
CALCIUM: 10.4 mg/dL — AB (ref 8.9–10.3)
CO2: 26 mmol/L (ref 22–32)
CREATININE: 1.31 mg/dL — AB (ref 0.44–1.00)
Chloride: 105 mmol/L (ref 101–111)
GFR, EST AFRICAN AMERICAN: 47 mL/min — AB (ref >60)
GFR, EST NON AFRICAN AMERICAN: 41 mL/min — AB (ref >60)
Glucose, Bld: 169 mg/dL — ABNORMAL HIGH (ref 65–99)
Potassium: 4.6 mmol/L (ref 3.5–5.1)
SODIUM: 138 mmol/L (ref 135–145)

## 2015-10-03 LAB — CBC WITH DIFFERENTIAL/PLATELET
BASOS PCT: 0 %
Basophils Absolute: 0 10*3/uL (ref 0.0–0.1)
EOS ABS: 0 10*3/uL (ref 0.0–0.7)
EOS PCT: 0 %
HEMATOCRIT: 34.8 % — AB (ref 36.0–46.0)
Hemoglobin: 11.1 g/dL — ABNORMAL LOW (ref 12.0–15.0)
Lymphocytes Relative: 12 %
Lymphs Abs: 0.9 10*3/uL (ref 0.7–4.0)
MCH: 26.2 pg (ref 26.0–34.0)
MCHC: 31.9 g/dL (ref 30.0–36.0)
MCV: 82.3 fL (ref 78.0–100.0)
MONO ABS: 0.3 10*3/uL (ref 0.1–1.0)
MONOS PCT: 4 %
NEUTROS ABS: 6.5 10*3/uL (ref 1.7–7.7)
Neutrophils Relative %: 84 %
Platelets: 312 10*3/uL (ref 150–400)
RBC: 4.23 MIL/uL (ref 3.87–5.11)
RDW: 14.1 % (ref 11.5–15.5)
WBC: 7.8 10*3/uL (ref 4.0–10.5)

## 2015-10-03 LAB — GLUCOSE, CAPILLARY
GLUCOSE-CAPILLARY: 119 mg/dL — AB (ref 65–99)
Glucose-Capillary: 113 mg/dL — ABNORMAL HIGH (ref 65–99)

## 2015-10-03 MED ORDER — SODIUM CHLORIDE 0.9 % IV SOLN
Freq: Once | INTRAVENOUS | Status: AC
  Administered 2015-10-03: 500 mL/h via INTRAVENOUS

## 2015-10-03 MED ORDER — LISINOPRIL 20 MG PO TABS
20.0000 mg | ORAL_TABLET | Freq: Every day | ORAL | Status: DC
  Administered 2015-10-03 – 2015-10-04 (×2): 20 mg via ORAL
  Filled 2015-10-03 (×2): qty 1

## 2015-10-03 MED ORDER — HYDROXYZINE PAMOATE 25 MG PO CAPS
50.0000 mg | ORAL_CAPSULE | Freq: Every evening | ORAL | Status: DC | PRN
  Filled 2015-10-03: qty 2

## 2015-10-03 MED ORDER — CLOPIDOGREL BISULFATE 75 MG PO TABS
75.0000 mg | ORAL_TABLET | Freq: Every day | ORAL | Status: DC
  Administered 2015-10-04: 75 mg via ORAL
  Filled 2015-10-03: qty 1

## 2015-10-03 MED ORDER — SODIUM CHLORIDE 0.9 % IV SOLN
INTRAVENOUS | Status: DC
  Administered 2015-10-03: 16:00:00 via INTRAVENOUS

## 2015-10-03 MED ORDER — REPAGLINIDE 2 MG PO TABS
2.0000 mg | ORAL_TABLET | Freq: Three times a day (TID) | ORAL | Status: DC
  Administered 2015-10-04 (×2): 2 mg via ORAL
  Filled 2015-10-03 (×3): qty 1

## 2015-10-03 MED ORDER — ONDANSETRON HCL 4 MG PO TABS
8.0000 mg | ORAL_TABLET | Freq: Three times a day (TID) | ORAL | Status: DC | PRN
  Administered 2015-10-03: 8 mg via ORAL
  Filled 2015-10-03: qty 2

## 2015-10-03 MED ORDER — INSULIN DETEMIR 100 UNIT/ML ~~LOC~~ SOLN
30.0000 [IU] | Freq: Every day | SUBCUTANEOUS | Status: DC
  Administered 2015-10-03: 30 [IU] via SUBCUTANEOUS
  Filled 2015-10-03 (×2): qty 0.3

## 2015-10-03 MED ORDER — ATENOLOL 50 MG PO TABS
50.0000 mg | ORAL_TABLET | Freq: Two times a day (BID) | ORAL | Status: DC
  Administered 2015-10-03 – 2015-10-04 (×2): 50 mg via ORAL
  Filled 2015-10-03 (×2): qty 1

## 2015-10-03 MED ORDER — AMLODIPINE BESYLATE 10 MG PO TABS
10.0000 mg | ORAL_TABLET | Freq: Every day | ORAL | Status: DC
  Administered 2015-10-03 – 2015-10-04 (×2): 10 mg via ORAL
  Filled 2015-10-03 (×2): qty 1

## 2015-10-03 MED ORDER — INSULIN ASPART 100 UNIT/ML ~~LOC~~ SOLN: 0.0000 [IU] | Freq: Three times a day (TID) | SUBCUTANEOUS | Status: DC

## 2015-10-03 MED ORDER — HEPARIN SODIUM (PORCINE) 5000 UNIT/ML IJ SOLN
5000.0000 [IU] | Freq: Three times a day (TID) | INTRAMUSCULAR | Status: DC
Start: 2015-10-03 — End: 2015-10-04
  Administered 2015-10-03 – 2015-10-04 (×2): 5000 [IU] via SUBCUTANEOUS
  Filled 2015-10-03 (×2): qty 1

## 2015-10-03 MED ORDER — ANASTROZOLE 1 MG PO TABS
1.0000 mg | ORAL_TABLET | Freq: Every day | ORAL | Status: DC
  Administered 2015-10-03 – 2015-10-04 (×2): 1 mg via ORAL
  Filled 2015-10-03 (×2): qty 1

## 2015-10-03 MED ORDER — SODIUM CHLORIDE 0.9 % IV SOLN
INTRAVENOUS | Status: AC
  Administered 2015-10-04: 02:00:00 via INTRAVENOUS

## 2015-10-03 MED ORDER — GABAPENTIN 300 MG PO CAPS
300.0000 mg | ORAL_CAPSULE | Freq: Every day | ORAL | Status: DC
Start: 2015-10-03 — End: 2015-10-04
  Administered 2015-10-03: 300 mg via ORAL
  Filled 2015-10-03: qty 1

## 2015-10-03 MED ORDER — FENOFIBRATE 54 MG PO TABS: 54.0000 mg | ORAL_TABLET | Freq: Every day | ORAL | Status: DC

## 2015-10-03 MED ORDER — ATORVASTATIN CALCIUM 20 MG PO TABS
20.0000 mg | ORAL_TABLET | Freq: Every day | ORAL | Status: DC
  Administered 2015-10-03: 20 mg via ORAL
  Filled 2015-10-03 (×2): qty 1

## 2015-10-03 MED ORDER — COLCHICINE 0.6 MG PO TABS: 0.6000 mg | ORAL_TABLET | Freq: Every day | ORAL | Status: DC

## 2015-10-03 MED ORDER — INSULIN ASPART 100 UNIT/ML ~~LOC~~ SOLN: 0.0000 [IU] | Freq: Every day | SUBCUTANEOUS | Status: DC

## 2015-10-03 NOTE — Assessment & Plan Note (Addendum)
Pt  Is here because of diarrhea for 1 week. She went to the ER yest and they thought viral infx.  She describes the diarrhea as watery, brown/yellow , without blood in it. On average, about 3-4 times a day, and says it happens whenever she eats and it comes out.  She denies any fevers, or constitutional symptoms, and no abd pain. She denies eating any foods out of the ordinary, and no seafood. Appetite is normal. No travel history, no pets. Patient has chronic diarrhea, and says they happen about once or twice a month and last 2-3 days, and she usually takes imodium and it goes away, but this time it lasted a week. She does work in a daycare. She has a history of Crohns? After pANCA, but had hemicolectomy in 2003, and biopsy neg for crohns. Also has history of SBO.  She has not taken colchicine recently for her gout. She has been taking fenofibrate for a long time  On exam, abd is nontender and +BS. VSS.  I still think this is a viral infx and told the patient to take PRN imodium for a week and come back next week if symptoms persist. However, her daughter is a Designer, jewellery and after talking to her on the phone, she preferred doing labwork on her today, and obtaining GI pathogen panel today as opposed to waiting a week more.   Pt felt very weak after she left the clinic, and called, and came back here.   Plan -ordered CBC BMET. No leukocytosis. Creatinine is at baseline.  -Giving 1 L NS bolus. -ordered GI pathogen panel, giardia, and Cdif -pt will follow up next week depending on the resolution of her symptoms. Will let the pt know about the results of the above if they are concerning -asked pt to not take colchicine right now.  Pt is also on the fenofibrate, and the prandin, both of which can cause diarrhea. If her diarrhea does not improve, then we might need to consider changing the meds. -Hypercalcemia 10.4 was noted on the BMET today. Repeat BMET at next visit.  Patient still feels weak  as of 4 PM. Giving another 500 cc NS. -We will admit her to medicine for overnight obs, and to work up the diarrhea

## 2015-10-03 NOTE — H&P (Signed)
Date: 10/03/2015               Patient Name:  [name] MRN: [id number]  DOB: [birth year] Age / Sex: 70 y.o., female   PCP: Bartholomew Crews, MD         Medical Service: Internal Medicine Teaching Service         Attending Physician: Dr. Oval Linsey, MD    First Contact: Dr. [name] Pager: [phone]  Second Contact: Dr. Albin Felling Pager: [phone]       After Hours (After 5p/  First Contact Pager: [phone]  weekends / holidays): Second Contact Pager: [phone]   Chief Complaint: Diarrhea since one wk.  History of Present Illness: [name] 70 y.o lady with PMH of DM, HTN,CAD with stent placement,  Had ileocolostomy(hemicolectomy) due to suspected crohn? with positive p ANCA in 2003 and pathology was negative for crohn and small bowel obstruction Came to [facility] with C/O diarrhea since 1 Wk.She went to ED yesterday and was told that it looks like viral infection. She describes the diarrhea as watery, brown/yellow , without blood in it. On average, about 3-4 times a day, and says it happens whenever she eats and it comes out., have abdominal pain only just before diarrhea, also C/O anorexia and mild nausea but no vomiting. Denies any pain, fever, chills, SOB. No H/O any recent travel, antibiotic use or change in her diet. She admits having similar episodes in the past but they normally resolved within 2-3 days and imodium always helped and it is not working this time. She is on Fenofibrate for a long time.  Meds: Current Facility-Administered Medications  Medication Dose Route Frequency Provider Last Rate Last Dose  . amLODipine (NORVASC) tablet 10 mg  10 mg Oral Daily Carly J Rivet, MD      . anastrozole (ARIMIDEX) tablet 1 mg  1 mg Oral Daily Carly J Rivet, MD      . atenolol (TENORMIN) tablet 50 mg  50 mg Oral BID Carly J Rivet, MD      . atorvastatin (LIPITOR) tablet 20 mg  20 mg Oral q1800 Carly J Rivet, MD      . clopidogrel (PLAVIX) tablet 75 mg  75 mg Oral Daily Carly  J Rivet, MD      . colchicine tablet 0.6 mg  0.6 mg Oral Daily Carly J Rivet, MD      . gabapentin (NEURONTIN) capsule 300 mg  300 mg Oral QHS Carly J Rivet, MD      . heparin injection 5,000 Units  5,000 Units Subcutaneous Q8H Carly J Rivet, MD      . hydrOXYzine (VISTARIL) capsule 50 mg  50 mg Oral QHS PRN Carly J Rivet, MD      . insulin aspart (novoLOG) injection 0-5 Units  0-5 Units Subcutaneous QHS Juliet Rude, MD      . Derrill Memo ON 10/04/2015] insulin aspart (novoLOG) injection 0-9 Units  0-9 Units Subcutaneous TID WC Carly J Rivet, MD      . insulin detemir (LEVEMIR) injection 30 Units  30 Units Subcutaneous QHS Carly J Rivet, MD      . lisinopril (PRINIVIL,ZESTRIL) tablet 20 mg  20 mg Oral Daily Carly Montey Hora, MD       Facility-Administered Medications Ordered in Other Encounters  Medication Dose Route Frequency Provider Last Rate Last Dose  . 0.9 %  sodium chloride infusion   Intravenous Continuous Burgess Estelle, MD 150 mL/hr at 10/03/15  1607      Allergies: Allergies as of 10/03/2015 - Review Complete 10/03/2015  Allergen Reaction Noted  . Percocet [oxycodone-acetaminophen] Other (See Comments) 05/05/2013  . Diazepam  04/17/2006  . Haloperidol lactate    . Lorazepam Nausea Only   . Morphine sulfate  04/17/2006  . Propoxyphene hcl  05/16/2007  . Tramadol hcl     Past Medical History  Diagnosis Date  . Hypertension     2006 B renal arteries patent. 2003 MRA - no RAS. 2003 pheo W/U Dr Hassell Done reportedly negative.  Marland Kitchen CAD (coronary artery disease) 1996    1996 - PTCA and angioplasty diagonal branch. 2000 - Rotoblator & angiopllasty of diagonal. 2006 - subendocardial AMI, DES to proximal LAD.Marland Kitchen Also had 90% stenosis in distal apical LAD. EF 55 with apical hypokinesis. Indefinite ASA and Plavix.  . Abdominal discomfort     Chronic N/V/D. Presumptive dx Crohn's dx per elevated p ANCA. Failed Entocort and Pentasa. Sep 2003 - ileocolectomy c anastomosis per Dr Deon Pilling 2/2 adhesions - path  was hegative for Crohns. EGD, Sm bowel follow through (11/03), and an eteroclysis (10/03) were unrevealing. Cuases hypomag and hypocalcemia.  . Anemia     Multifactorial. Baseline HgB 10-11 ish. B12 def - 150 in 3/10. Fe Def - ferritin 35 3/10. Both are being repleted.  . Stroke [facility])     Incidental finding MRI 2002 L lacunar infarct  . Diabetes mellitus     Insulin dependent  . Allergy     Seasonal  . Chronic pain     CT 10/10 = Spinal stenosis L2 - S1.  Marland Kitchen Chronic kidney disease     Chronic renal insuff baseline Cr 1.2 - 1.4 ish.  . Adnexal mass 8/03    s/p lap BSO (R ovarian fibroma) & lysis of adhesions  . Hyperlipidemia     Managed with both a statin and Welchol. Welchol stopped 2014 2/2 cost and started on fenofibrate   . Wears dentures     top  . RBBB   . Breast cancer ([place]) 03/16/13    right, 5 o'clock  . Hx of radiation therapy 06/02/13- 07/16/13    right rbeast 4500 cGy 25 sessions, right breast boost 1600 cGy in 8 sessions  . Lupus ([place])     Family History:From her chart. . Pancreatic cancer Brother 54  . Cancer Mother   . Breast cancer Mother 47  . Hypertension Daughter   . Lung cancer Maternal Aunt   . Prostate cancer Maternal Uncle   . Heart attack Maternal Grandmother   . Kidney failure Maternal Aunt         Social History: Non smoker No alcohol use.  Review of Systems: A complete ROS was negative except as per HPI.   Physical Exam: Blood pressure 179/65, temperature 99 F (37.2 C), temperature source Oral, resp. rate 20, height 5' 5"  (1.651 m), weight 178 lb 11.2 oz (81.058 kg), SpO2 98 %. Filed Vitals:   10/03/15 1720  BP: 179/65  Temp: 99 F (37.2 C)  TempSrc: Oral  Resp: 20  Height: 5' 5"  (1.651 m)  Weight: 178 lb 11.2 oz (81.058 kg)  SpO2: 98%   General: Vital signs reviewed.  Patient is well-developed and well-nourished, in no acute distress and cooperative with exam.  Head: Normocephalic and  atraumatic. Eyes: EOMI, conjunctivae normal, no scleral icterus.  Neck: Supple, trachea midline, normal ROM, no JVD, masses, thyromegaly, or carotid bruit present.  Cardiovascular: RRR, S1 normal, S2 normal,  no murmurs, gallops, or rubs. Pulmonary/Chest: Clear to auscultation bilaterally, no wheezes, rales, or rhonchi. Abdominal: Soft, non-tender, non-distended, BS +, no masses, organomegaly, or guarding present.  Musculoskeletal: No joint deformities, erythema, or stiffness,  Extremities: No lower extremity edema bilaterally,  pulses symmetric and intact bilaterally. No cyanosis or clubbing. Neurological: A&O x3, Motor and sensory Grossly intact Skin: Warm, dry and intact. No rashes or erythema. Slightly dry mucous membranes, Positive skin turgor.  Labs: CBC Latest Ref Rng 10/03/2015 06/02/2015 12/06/2014  WBC 4.0 - 10.5 K/uL 7.8 7.2 8.3  Hemoglobin 12.0 - 15.0 g/dL 11.1(L) 9.9(L) 9.6(L)  Hematocrit 36.0 - 46.0 % 34.8(L) 31.2(L) 29.0(L)  Platelets 150 - 400 K/uL 312 294 295   BMP Latest Ref Rng 10/03/2015 07/21/2015 06/02/2015  Glucose 65 - 99 mg/dL 169(H) 130(H) 160(H)  BUN 6 - 20 mg/dL 24(H) 40(H) 37.8(H)  Creatinine 0.44 - 1.00 mg/dL 1.31(H) 1.60(H) 1.8(H)  BUN/Creat Ratio 12 - 28 - 25 -  Sodium 135 - 145 mmol/L 138 146(H) 141  Potassium 3.5 - 5.1 mmol/L 4.6 4.1 3.9  Chloride 101 - 111 mmol/L 105 101 -  CO2 22 - 32 mmol/L 26 19 29   Calcium 8.9 - 10.3 mg/dL 10.4(H) 10.1 9.7    Assessment & Plan by Problem: Diarrhea: Ms. Daddario 70 y.o lady came with H/O diarrhea since 1 wk. She was admitted to get some IV fluids and further work up.Looks like a chronic problem which is not resolving this time.She is on Fenofibrate for a long time. -C Diff PCR -Giardia/Cryptosporium EIA -Discontinue Fenofibrate -IV NS -Imodium PRN   DM: Her recent CBG are 122 and 169. We will slightly decrease her home dose of Levemir to 30 units BID as she is not eating well. Continue with sliding scale.  HTN: Her  BP on admission was 170/65. We will continue her home meds. Amlodipine, and lisinopril.  CAD: Currently stable , continue with her home meds. Plavix, Lipitor and Atenolol.  Diet: Soft, heart friendly  DVT: Heparin    Dispo: Admit patient to Observation with expected length of stay less than 2 midnights.  Signed: [name], MD 10/03/2015, 5:48 PM  Pager: [phone]

## 2015-10-03 NOTE — Addendum Note (Signed)
Addended by: Orson Gear on: 10/03/2015 12:28 PM   Modules accepted: Orders

## 2015-10-03 NOTE — Progress Notes (Signed)
Internal Medicine Clinic Attending  Case discussed with Dr. Tiburcio Pea soon after the resident saw the patient.  We reviewed the resident's history and exam and pertinent patient test results.  I agree with the assessment, diagnosis, and plan of care documented in the resident's note.

## 2015-10-03 NOTE — Addendum Note (Signed)
Addended by: Hulan Fray on: 10/03/2015 04:11 PM   Modules accepted: Orders

## 2015-10-03 NOTE — Progress Notes (Signed)
Patient ID: [name], female   DOB: Dec 06, 1945, 71 y.o.   MRN: [id number]    CC: diarrhea HPI: [name] is a 70 y.o. woman with PMH noted below here for diarrhea for 1 week  Please see Problem List/A&P for the status of the patient's chronic medical problems   Past Medical History  Diagnosis Date  . Hypertension     2006 B renal arteries patent. 2003 MRA - no RAS. 2003 pheo W/U Dr Hassell Done reportedly negative.  Marland Kitchen CAD (coronary artery disease) 1996    1996 - PTCA and angioplasty diagonal branch. 2000 - Rotoblator & angiopllasty of diagonal. 2006 - subendocardial AMI, DES to proximal LAD.Marland Kitchen Also had 90% stenosis in distal apical LAD. EF 55 with apical hypokinesis. Indefinite ASA and Plavix.  . Abdominal discomfort     Chronic N/V/D. Presumptive dx Crohn's dx per elevated p ANCA. Failed Entocort and Pentasa. Sep 2003 - ileocolectomy c anastomosis per Dr Deon Pilling 2/2 adhesions - path was hegative for Crohns. EGD, Sm bowel follow through (11/03), and an eteroclysis (10/03) were unrevealing. Cuases hypomag and hypocalcemia.  . Anemia     Multifactorial. Baseline HgB 10-11 ish. B12 def - 150 in 3/10. Fe Def - ferritin 35 3/10. Both are being repleted.  . Stroke [facility])     Incidental finding MRI 2002 L lacunar infarct  . Diabetes mellitus     Insulin dependent  . Allergy     Seasonal  . Chronic pain     CT 10/10 = Spinal stenosis L2 - S1.  Marland Kitchen Chronic kidney disease     Chronic renal insuff baseline Cr 1.2 - 1.4 ish.  . Adnexal mass 8/03    s/p lap BSO (R ovarian fibroma) & lysis of adhesions  . Hyperlipidemia     Managed with both a statin and Welchol. Welchol stopped 2014 2/2 cost and started on fenofibrate   . Wears dentures     top  . RBBB   . Breast cancer ([place]) 03/16/13    right, 5 o'clock  . Hx of radiation therapy 06/02/13- 07/16/13    right rbeast 4500 cGy 25 sessions, right breast boost 1600 cGy in 8 sessions  . Lupus ([place])     Review of Systems:  Negative, except as  per HPI  Physical Exam: Filed Vitals:   10/03/15 0855  BP: 157/69  Pulse: 77  Temp: 98.1 F (36.7 C)  TempSrc: Oral  Height: 5' 5"  (1.651 m)  Weight: 179 lb 3.2 oz (81.285 kg)  SpO2: 100%    General: A&O, in NAD HEENT: MMM CV: RRR, normal s1, s2, no m/r/g, no carotid bruits appreciated Resp: equal and symmetric breath sounds, no wheezing or crackles  Abdomen: soft, nontender, nondistended, +BS, no tympany to percussion,  Skin: warm, dry, intact Extremities: pulses intact b/l, no edema   Assessment & Plan:   See encounters tab for problem based medical decision making. Patient discussed with Dr. Angelia Mould

## 2015-10-03 NOTE — Progress Notes (Signed)
Pt admitted to the unit at 1717. Pt mental status is A&O x4. Pt oriented to room, staff, and call bell. Skin is intact except where otherwise charted. Full assessment charted in [facility]. Call bell within reach. Visitor guidelines reviewed w/ pt and/or family.

## 2015-10-03 NOTE — Addendum Note (Signed)
Addended by: Sander Nephew F on: 10/03/2015 01:26 PM   Modules accepted: Orders

## 2015-10-03 NOTE — Progress Notes (Signed)
Patient complains of nausea. MD paged for PRN nausea orders.

## 2015-10-03 NOTE — Patient Instructions (Signed)
Thank you for your visit today Please continue to take the imodium as needed for your diarrhea. Please call the clinic if you have diarrhea > 6 times a day. Please bring the stool sample on Monday if you continue to have diarrhea > 6 times a day. Please stay plenty hydrated and drink gatorade or pedialyte  Diarrhea Diarrhea is frequent loose and watery bowel movements. It can cause you to feel weak and dehydrated. Dehydration can cause you to become tired and thirsty, have a dry mouth, and have decreased urination that often is dark yellow. Diarrhea is a sign of another problem, most often an infection that will not last long. In most cases, diarrhea typically lasts 2-3 days. However, it can last longer if it is a sign of something more serious. It is important to treat your diarrhea as directed by your caregiver to lessen or prevent future episodes of diarrhea. CAUSES  Some common causes include:  Gastrointestinal infections caused by viruses, bacteria, or parasites.  Food poisoning or food allergies.  Certain medicines, such as antibiotics, chemotherapy, and laxatives.  Artificial sweeteners and fructose.  Digestive disorders. HOME CARE INSTRUCTIONS  Ensure adequate fluid intake (hydration): Have 1 cup (8 oz) of fluid for each diarrhea episode. Avoid fluids that contain simple sugars or sports drinks, fruit juices, whole milk products, and sodas. Your urine should be clear or pale yellow if you are drinking enough fluids. Hydrate with an oral rehydration solution that you can purchase at pharmacies, retail stores, and online. You can prepare an oral rehydration solution at home by mixing the following ingredients together:   - tsp table salt.   tsp baking soda.   tsp salt substitute containing potassium chloride.  1  tablespoons sugar.  1 L (34 oz) of water.  Certain foods and beverages may increase the speed at which food moves through the gastrointestinal (GI) tract. These foods  and beverages should be avoided and include:  Caffeinated and alcoholic beverages.  High-fiber foods, such as raw fruits and vegetables, nuts, seeds, and whole grain breads and cereals.  Foods and beverages sweetened with sugar alcohols, such as xylitol, sorbitol, and mannitol.  Some foods may be well tolerated and may help thicken stool including:  Starchy foods, such as rice, toast, pasta, low-sugar cereal, oatmeal, grits, baked potatoes, crackers, and bagels.  Bananas.  Applesauce.  Add probiotic-rich foods to help increase healthy bacteria in the GI tract, such as yogurt and fermented milk products.  Wash your hands well after each diarrhea episode.  Only take over-the-counter or prescription medicines as directed by your caregiver.  Take a warm bath to relieve any burning or pain from frequent diarrhea episodes. SEEK IMMEDIATE MEDICAL CARE IF:   You are unable to keep fluids down.  You have persistent vomiting.  You have blood in your stool, or your stools are black and tarry.  You do not urinate in 6-8 hours, or there is only a small amount of very dark urine.  You have abdominal pain that increases or localizes.  You have weakness, dizziness, confusion, or light-headedness.  You have a severe headache.  Your diarrhea gets worse or does not get better.  You have a fever or persistent symptoms for more than 2-3 days.  You have a fever and your symptoms suddenly get worse. MAKE SURE YOU:   Understand these instructions.  Will watch your condition.  Will get help right away if you are not doing well or get worse.   This  information is not intended to replace advice given to you by your health care provider. Make sure you discuss any questions you have with your health care provider.   Document Released: 02/23/2002 Document Revised: 03/26/2014 Document Reviewed: 11/11/2011 Elsevier Interactive Patient Education [employer].

## 2015-10-04 ENCOUNTER — Telehealth: Payer: Self-pay | Admitting: Internal Medicine

## 2015-10-04 ENCOUNTER — Encounter (HOSPITAL_COMMUNITY): Payer: Self-pay

## 2015-10-04 DIAGNOSIS — E785 Hyperlipidemia, unspecified: Secondary | ICD-10-CM | POA: Diagnosis not present

## 2015-10-04 DIAGNOSIS — Z853 Personal history of malignant neoplasm of breast: Secondary | ICD-10-CM | POA: Diagnosis not present

## 2015-10-04 DIAGNOSIS — Z800 Family history of malignant neoplasm of digestive organs: Secondary | ICD-10-CM | POA: Diagnosis not present

## 2015-10-04 DIAGNOSIS — I4510 Unspecified right bundle-branch block: Secondary | ICD-10-CM | POA: Diagnosis not present

## 2015-10-04 DIAGNOSIS — M329 Systemic lupus erythematosus, unspecified: Secondary | ICD-10-CM | POA: Diagnosis not present

## 2015-10-04 DIAGNOSIS — R197 Diarrhea, unspecified: Secondary | ICD-10-CM

## 2015-10-04 DIAGNOSIS — Z955 Presence of coronary angioplasty implant and graft: Secondary | ICD-10-CM | POA: Diagnosis not present

## 2015-10-04 DIAGNOSIS — Z8249 Family history of ischemic heart disease and other diseases of the circulatory system: Secondary | ICD-10-CM | POA: Diagnosis not present

## 2015-10-04 DIAGNOSIS — Z841 Family history of disorders of kidney and ureter: Secondary | ICD-10-CM | POA: Diagnosis not present

## 2015-10-04 DIAGNOSIS — Z803 Family history of malignant neoplasm of breast: Secondary | ICD-10-CM | POA: Diagnosis not present

## 2015-10-04 DIAGNOSIS — Z8042 Family history of malignant neoplasm of prostate: Secondary | ICD-10-CM | POA: Diagnosis not present

## 2015-10-04 DIAGNOSIS — E860 Dehydration: Secondary | ICD-10-CM

## 2015-10-04 DIAGNOSIS — I10 Essential (primary) hypertension: Secondary | ICD-10-CM | POA: Diagnosis not present

## 2015-10-04 DIAGNOSIS — I252 Old myocardial infarction: Secondary | ICD-10-CM | POA: Diagnosis not present

## 2015-10-04 DIAGNOSIS — A088 Other specified intestinal infections: Secondary | ICD-10-CM | POA: Diagnosis not present

## 2015-10-04 DIAGNOSIS — Z794 Long term (current) use of insulin: Secondary | ICD-10-CM | POA: Diagnosis not present

## 2015-10-04 DIAGNOSIS — E118 Type 2 diabetes mellitus with unspecified complications: Secondary | ICD-10-CM | POA: Diagnosis not present

## 2015-10-04 DIAGNOSIS — Z801 Family history of malignant neoplasm of trachea, bronchus and lung: Secondary | ICD-10-CM | POA: Diagnosis not present

## 2015-10-04 DIAGNOSIS — E1122 Type 2 diabetes mellitus with diabetic chronic kidney disease: Secondary | ICD-10-CM | POA: Diagnosis not present

## 2015-10-04 DIAGNOSIS — G8929 Other chronic pain: Secondary | ICD-10-CM | POA: Diagnosis not present

## 2015-10-04 DIAGNOSIS — I2510 Atherosclerotic heart disease of native coronary artery without angina pectoris: Secondary | ICD-10-CM | POA: Diagnosis not present

## 2015-10-04 DIAGNOSIS — I129 Hypertensive chronic kidney disease with stage 1 through stage 4 chronic kidney disease, or unspecified chronic kidney disease: Secondary | ICD-10-CM | POA: Diagnosis not present

## 2015-10-04 DIAGNOSIS — Z8673 Personal history of transient ischemic attack (TIA), and cerebral infarction without residual deficits: Secondary | ICD-10-CM | POA: Diagnosis not present

## 2015-10-04 DIAGNOSIS — Z7982 Long term (current) use of aspirin: Secondary | ICD-10-CM | POA: Diagnosis not present

## 2015-10-04 DIAGNOSIS — N189 Chronic kidney disease, unspecified: Secondary | ICD-10-CM | POA: Diagnosis not present

## 2015-10-04 DIAGNOSIS — D649 Anemia, unspecified: Secondary | ICD-10-CM | POA: Diagnosis not present

## 2015-10-04 DIAGNOSIS — Z885 Allergy status to narcotic agent status: Secondary | ICD-10-CM | POA: Diagnosis not present

## 2015-10-04 DIAGNOSIS — Z923 Personal history of irradiation: Secondary | ICD-10-CM | POA: Diagnosis not present

## 2015-10-04 LAB — BASIC METABOLIC PANEL
Anion gap: 7 (ref 5–15)
BUN: 13 mg/dL (ref 6–20)
CHLORIDE: 108 mmol/L (ref 101–111)
CO2: 25 mmol/L (ref 22–32)
CREATININE: 1.11 mg/dL — AB (ref 0.44–1.00)
Calcium: 9.6 mg/dL (ref 8.9–10.3)
GFR calc Af Amer: 58 mL/min — ABNORMAL LOW (ref >60)
GFR, EST NON AFRICAN AMERICAN: 50 mL/min — AB (ref >60)
Glucose, Bld: 114 mg/dL — ABNORMAL HIGH (ref 65–99)
Potassium: 3.9 mmol/L (ref 3.5–5.1)
SODIUM: 140 mmol/L (ref 135–145)

## 2015-10-04 LAB — GLUCOSE, CAPILLARY
Glucose-Capillary: 116 mg/dL — ABNORMAL HIGH (ref 65–99)
Glucose-Capillary: 125 mg/dL — ABNORMAL HIGH (ref 65–99)

## 2015-10-04 MED ORDER — SALINE SPRAY 0.65 % NA SOLN
1.0000 | NASAL | Status: DC | PRN
  Filled 2015-10-04: qty 44

## 2015-10-04 NOTE — Progress Notes (Signed)
   Subjective: Pt. Was feeling better. Didn't had BM since her admission yesterday. C/O mild occasional nausea but no vomiting. Objective: Vital signs in last 24 hours: Filed Vitals:   10/03/15 2125 10/03/15 2334 10/04/15 0510 10/04/15 0558  BP: 180/68 158/64 132/76   Pulse: 73 69 61   Temp: 98.2 F (36.8 C)   98.4 F (36.9 C)  TempSrc: Oral   Oral  Resp: 18  18   Height:      Weight:      SpO2: 96%  100%    Physical Exam General: pt. Was sitting in her bed, comfortably. Chest: Clear, no added sounds CVS: RRR, No R/M/G Abdomen: Soft, non tender, non distended. BS +ve Extremities: No edema, no cyanosis, PP +ve. Bilaterally  Labs: BMP Latest Ref Rng 10/04/2015 10/03/2015 07/21/2015  Glucose 65 - 99 mg/dL 114(H) 169(H) 130(H)  BUN 6 - 20 mg/dL 13 24(H) 40(H)  Creatinine 0.44 - 1.00 mg/dL 1.11(H) 1.31(H) 1.60(H)  BUN/Creat Ratio 12 - 28 - - 25  Sodium 135 - 145 mmol/L 140 138 146(H)  Potassium 3.5 - 5.1 mmol/L 3.9 4.6 4.1  Chloride 101 - 111 mmol/L 108 105 101  CO2 22 - 32 mmol/L 25 26 19   Calcium 8.9 - 10.3 mg/dL 9.6 10.4(H) 10.1    Assessment/Plan: Diarrhea: [name] 70 y.o lady came with H/O diarrhea since 1 wk. She was admitted to get some IV fluids and further work up.Looks like a chronic problem which is not resolving this time.She is on Fenofibrate for a long time.Her diarrhea and generalized weakness improved after IV NS.She was unable to provide any stool sample as she didn't had any BM since admission. We will collect sample if she had a BM. She is being discharged. We stopped her Fenofibrate temporary and she can be reassessed in clinic about restarting that.  DM: Her recent CBG are 116 and 125. We will discharge her on her home dose of Levamir.  HTN: Her BP on admission was 170/65. We will continue her home meds. Amlodipine, and lisinopril.  CAD: Currently stable , continue with her home meds. Plavix, Lipitor and Atenolol.   Dispo: Discharged today   Katie Nimrod, MD 10/04/2015, 11:56 AM Pager: [phone]

## 2015-10-04 NOTE — Discharge Summary (Signed)
Name: [name] MRN: [id number] DOB: 09/19/1945 70 y.o. PCP: Bartholomew Crews, MD  Date of Admission: 10/03/2015  5:17 PM Date of Discharge: 10/04/2015 Attending Physician: No att. providers found  Discharge Diagnosis: 1. Secretory Diarrhea 2: DM 3: HTN 4:CAD   Discharge Medications:   Medication List    STOP taking these medications        fenofibrate 54 MG tablet      TAKE these medications        amLODipine 10 MG tablet  Commonly known as:  NORVASC  Take 1 tablet by mouth  daily     anastrozole 1 MG tablet  Commonly known as:  ARIMIDEX  Take 1 tablet by mouth  daily     aspirin EC 81 MG tablet  Take 81 mg by mouth daily.     atenolol 50 MG tablet  Commonly known as:  TENORMIN  Take 1 tablet by mouth two  times daily     atorvastatin 20 MG tablet  Commonly known as:  LIPITOR  Take 1 tablet by mouth at  bedtime     clopidogrel 75 MG tablet  Commonly known as:  PLAVIX  Take 1 tablet by mouth once a day     colchicine 0.6 MG tablet  Commonly known as:  COLCRYS  Take 1 tablet (0.6 mg total) by mouth daily.     desloratadine 5 MG tablet  Commonly known as:  CLARINEX  Take 5 mg by mouth at bedtime as needed (allergies). Reported on 04/11/2015     ferrous fumarate 325 (106 Fe) MG Tabs tablet  Commonly known as:  HEMOCYTE - 106 mg FE  Take 1 tablet (106 mg of iron total) by mouth daily.     furosemide 20 MG tablet  Commonly known as:  LASIX  Take 1 tablet by mouth two  times daily     gabapentin 300 MG capsule  Commonly known as:  NEURONTIN  Take 1 capsule by mouth at  bedtime     glucose blood test strip  Commonly known as:  ONE TOUCH ULTRA TEST  Use to check blood sugar 3 to 4 times daily. diag code E11.29. Insulin dependent     hydrOXYzine 50 MG capsule  Commonly known as:  VISTARIL  Take 1 capsule (50 mg total) by mouth at bedtime as needed.     LEVEMIR 100 UNIT/ML injection  Generic drug:  insulin detemir  INJECT 36 UNITS  SUBCUTANEOUSLY AT BEDTIME     lisinopril 20 MG tablet  Commonly known as:  PRINIVIL,ZESTRIL  Take 1 tablet (20 mg total) by mouth daily.     loperamide 2 MG capsule  Commonly known as:  IMODIUM  TAKE ONE CAPSULE BY MOUTH ONCE DAILY AS NEEDED FOR DIARRHEA OR LOOSE STOOLS     nitroGLYCERIN 0.4 MG SL tablet  Commonly known as:  NITROSTAT  Place 1 tablet (0.4 mg total) under the tongue every 5 (five) minutes as needed for chest pain.     potassium chloride 10 MEQ tablet  Commonly known as:  K-DUR  Take 2 tablets (20 mEq total) by mouth daily.     repaglinide 2 MG tablet  Commonly known as:  PRANDIN  Take 1 tablet by mouth 3  times a day before meals        Disposition and follow-up:   [name] was discharged from [facility] in Good condition.  At the hospital follow up visit please address:  1.  Her chronic intermittent diarrhea, as she have an urge for BM  after eating anything, looks like secretory diarrhea.  2.  Labs / imaging needed at time of follow-up: None  3.  Pending labs/ test needing follow-up: None  Follow-up Appointments: Follow-up Information    Follow up with Larey Dresser, MD On 10/13/2015.   Specialty:  Internal Medicine   Why:  Please F/U at [facility] on 10/13/15 at 10:15 am.   Contact information:   [place] [address] [phone]       Hospital Course by problem list:    Diarrhea: History of Present Illness: [name] 70 y.o lady with PMH of DM, HTN,CAD with stent placement, Had ileocolostomy(hemicolectomy) due to suspected crohn? with positive p ANCA in 2003 and pathology was negative for crohn and small bowel obstruction Came to [facility] with C/O diarrhea since 1 Wk.She went to ED yesterday and was told that it looks like viral infection. She describes the diarrhea as watery, brown/yellow , without blood in it. On average, about 3-4 times a day, and says it happens whenever she eats and it comes out., have  abdominal pain only just before diarrhea, also C/O anorexia and mild nausea but no vomiting. Denies any pain, fever, chills, SOB. No H/O any recent travel, antibiotic use or change in her diet. She admits having similar episodes in the past but they normally resolved within 2-3 days and imodium always helped and it is not working this time. She is on Fenofibrate for a long time. She was admitted to get some IV fluids and further work up.Her diarrhea and generalized weakness improved after IV NS.We were unable to collect sample if she didn't had BM during her hospital stay. She is being discharged. We stopped her Fenofibrate temporary and she can be reassessed in clinic about restarting that.  DM: Her recent CBG are 116 and 125. We will discharge her on her home dose of Levamir.  HTN: Her BP on admission was 170/65. We will continue her home meds. Amlodipine, and lisinopril.  CAD: Currently stable , continue with her home meds. Plavix, Lipitor and Atenolol.  Discharge Vitals:   BP 131/57 mmHg  Pulse 66  Temp(Src) 98.4 F (36.9 C) (Oral)  Resp 20  Ht 5' 5"  (1.651 m)  Wt 178 lb 11.2 oz (81.058 kg)  BMI 29.74 kg/m2  SpO2 98% Physical Exam General: pt. Was sitting in her bed, comfortably. Chest: Clear, no added sounds CVS: RRR, No R/M/G Abdomen: Soft, non tender, non distended. BS +ve Extremities: No edema, no cyanosis, PP +ve. Bilaterally  Pertinent Labs, Studies, and Procedures:  BMP Latest Ref Rng 10/04/2015 10/03/2015 07/21/2015  Glucose 65 - 99 mg/dL 114(H) 169(H) 130(H)  BUN 6 - 20 mg/dL 13 24(H) 40(H)  Creatinine 0.44 - 1.00 mg/dL 1.11(H) 1.31(H) 1.60(H)  BUN/Creat Ratio 12 - 28 - - 25  Sodium 135 - 145 mmol/L 140 138 146(H)  Potassium 3.5 - 5.1 mmol/L 3.9 4.6 4.1  Chloride 101 - 111 mmol/L 108 105 101  CO2 22 - 32 mmol/L 25 26 19   Calcium 8.9 - 10.3 mg/dL 9.6 10.4(H) 10.1   CBC Latest Ref Rng 10/03/2015 06/02/2015 12/06/2014  WBC 4.0 - 10.5 K/uL 7.8 7.2 8.3  Hemoglobin 12.0 - 15.0  g/dL 11.1(L) 9.9(L) 9.6(L)  Hematocrit 36.0 - 46.0 % 34.8(L) 31.2(L) 29.0(L)  Platelets 150 - 400 K/uL 312 294 295     Discharge Instructions: Discharge Instructions    Diet - low  sodium heart healthy    Complete by:  As directed      Discharge instructions    Complete by:  As directed   It was pleasure taking care of you. We stopped your fenofibrate due to diarrhea, please follow up in the clinic on 10/13/15 at 10:15 am and that will be reassessed at that time.     Increase activity slowly    Complete by:  As directed            Signed: [name], MD 10/04/2015, 5:26 PM   Pager: [phone]

## 2015-10-04 NOTE — Telephone Encounter (Signed)
HFU-TRANS/CARE APPT 10/13/2015 @ 10:15AM

## 2015-10-04 NOTE — Progress Notes (Signed)
Pt given discharge instructions and care notes. Pt verbalized understanding AEB no further questions or concerns at this time. IV was discontinued, no redness, pain, or swelling noted at this time. Pt left the floor via wheelchair with staff in stable condition.

## 2015-10-04 NOTE — Progress Notes (Signed)
Internal Medicine Attending  Date: 10/04/2015  Patient name: [name] Medical record number: [id number] Date of birth: Sep 03, 1945 Age: 70 y.o. Gender: female  I saw and evaluated the patient. I reviewed the resident's note by Dr. Reesa Chew and I agree with the resident's findings and plans as documented in her progress note.  Please see my H&P dated 10/04/2015 for the specifics of my evaluation, assessment, plan from earlier today.

## 2015-10-04 NOTE — H&P (Signed)
Internal Medicine Attending Admission Note Date: 10/04/2015  Patient name: [name] Medical record number: [id number] Date of birth: 06-24-1945 Age: 70 y.o. Gender: female  I saw and evaluated the patient. I reviewed the resident's note and I agree with the resident's findings and plan as documented in the resident's note.  Chief Complaint(s): Diarrhea 1 week.  History - key components related to admission:  [name] is a 70 year old woman with a history of diabetes, hypertension, coronary artery disease status post percutaneous coronary intervention, ileocolostomy for unclear reasons, and history of chronic intermittent diarrhea lasting about 2-3 days each month who presents with a one-week history of diarrhea and generalized weakness. She denies any recent sick contacts or travel. The diarrhea is watery without blood. She usually has 3-4 bowel movements per day over the last week and gets abdominal cramping just prior to the episode of defecation that is relieved after defecation. She denies any fevers, shakes, chills, change in diet, or recent antibiotic use. She was admitted from the [facility] for observation and workup of the diarrhea.  When seen on rounds the morning after admission she stated the diarrhea had resolved and she has not had a bowel movement since admission. Symptomatically she responded well to the fluids with regards to her weakness. As noted above no stool samples were able to be collected to work the diarrhea up since it resolved spontaneously  Physical Exam - key components related to admission:  Filed Vitals:   10/03/15 2334 10/04/15 0510 10/04/15 0558 10/04/15 1427  BP: 158/64 132/76  131/57  Pulse: 69 61  66  Temp:   98.4 F (36.9 C)   TempSrc:   Oral   Resp:  18  20  Height:      Weight:      SpO2:  100%  98%   Gen.: Well-developed, well-nourished, woman lying comfortably bed in no acute distress. Abdomen: Soft, nontender, without  guarding or rebound.  Lab results:  Basic Metabolic Panel:  Recent Labs  10/03/15 1026 10/04/15 0830  NA 138 140  K 4.6 3.9  CL 105 108  CO2 26 25  GLUCOSE 169* 114*  BUN 24* 13  CREATININE 1.31* 1.11*  CALCIUM 10.4* 9.6   CBC:  Recent Labs  10/03/15 1026  WBC 7.8  NEUTROABS 6.5  HGB 11.1*  HCT 34.8*  MCV 82.3  PLT 312   CBG:  Recent Labs  10/03/15 1811 10/03/15 2122 10/04/15 0742 10/04/15 1158  GLUCAP 119* 113* 116* 125*   Misc. Labs:  Patient unable to provide stool sample for further evaluation.  Assessment & Plan by Problem:  [name] is a 70 year old woman with a history of diabetes, hypertension, coronary artery disease status post percutaneous coronary intervention, ileocolostomy for unclear reasons, and history of chronic intermittent diarrhea lasting about 2-3 days each month who presents with a one-week history of diarrhea and generalized weakness. The diarrhea spontaneously resolved upon admission and she is had no further bouts so was unable to provide [place] a sample for further evaluation. Symptomatically she responded well to IV fluids with regards to her generalized weakness.  1) Diarrhea: Spontaneously resolved.  2) Disposition: She will be discharged home today with follow-up in the [facility].

## 2015-10-04 NOTE — Care Management Obs Status (Signed)
[facility] NOTIFICATION   Patient Details  Name: [name] MRN: [id number] Date of Birth: 1945/04/17   Medicare Observation Status Notification Given:  Yes    Sharin Mons, RN 10/04/2015, 1:00 PM

## 2015-10-13 ENCOUNTER — Ambulatory Visit (INDEPENDENT_AMBULATORY_CARE_PROVIDER_SITE_OTHER): Payer: Medicare Other | Admitting: Internal Medicine

## 2015-10-13 ENCOUNTER — Encounter: Payer: Self-pay | Admitting: Internal Medicine

## 2015-10-13 ENCOUNTER — Encounter (INDEPENDENT_AMBULATORY_CARE_PROVIDER_SITE_OTHER): Payer: Self-pay

## 2015-10-13 DIAGNOSIS — D519 Vitamin B12 deficiency anemia, unspecified: Secondary | ICD-10-CM

## 2015-10-13 DIAGNOSIS — Z09 Encounter for follow-up examination after completed treatment for conditions other than malignant neoplasm: Secondary | ICD-10-CM

## 2015-10-13 DIAGNOSIS — Z8619 Personal history of other infectious and parasitic diseases: Secondary | ICD-10-CM

## 2015-10-13 DIAGNOSIS — R197 Diarrhea, unspecified: Secondary | ICD-10-CM

## 2015-10-13 MED ORDER — CYANOCOBALAMIN 1000 MCG/ML IJ SOLN
1000.0000 ug | Freq: Once | INTRAMUSCULAR | Status: AC
  Administered 2015-10-13: 1000 ug via INTRAMUSCULAR

## 2015-10-13 NOTE — Progress Notes (Signed)
Medicine attending: Medical history, presenting problems, physical findings, and medications, reviewed with resident physician Dr Burgess Estelle on the day of the patient visit and I concur with his evaluation and management plan. Patient recently admitted with C diff colitis/E Coli sepsis. Received 5 days IV antibiotics. Never got Rx for PO Keflex at discharge, unclear why. Doing well clinically. On oral Vancomycin. We will not add additional gram neg coverage at this time.

## 2015-10-13 NOTE — Progress Notes (Signed)
    CC: hospital follow up for diarrhea HPI: [name] is a 70 y.o. woman with PMH noted below here for hospital follow up for her diarrhea and B12 injection   Please see Problem List/A&P for the status of the patient's chronic medical problems   Past Medical History:  Diagnosis Date  . Abdominal discomfort    Chronic N/V/D. Presumptive dx Crohn's dx per elevated p ANCA. Failed Entocort and Pentasa. Sep 2003 - ileocolectomy c anastomosis per Dr Deon Pilling 2/2 adhesions - path was hegative for Crohns. EGD, Sm bowel follow through (11/03), and an eteroclysis (10/03) were unrevealing. Cuases hypomag and hypocalcemia.  . Adnexal mass 8/03   s/p lap BSO (R ovarian fibroma) & lysis of adhesions  . Allergy    Seasonal  . Anemia    Multifactorial. Baseline HgB 10-11 ish. B12 def - 150 in 3/10. Fe Def - ferritin 35 3/10. Both are being repleted.  . Breast cancer ([place]) 03/16/13   right, 5 o'clock  . CAD (coronary artery disease) 1996   1996 - PTCA and angioplasty diagonal branch. 2000 - Rotoblator & angiopllasty of diagonal. 2006 - subendocardial AMI, DES to proximal LAD.Marland Kitchen Also had 90% stenosis in distal apical LAD. EF 55 with apical hypokinesis. Indefinite ASA and Plavix.  . Chronic kidney disease    Chronic renal insuff baseline Cr 1.2 - 1.4 ish.  . Chronic pain    CT 10/10 = Spinal stenosis L2 - S1.  . Diabetes mellitus    Insulin dependent  . Hx of radiation therapy 06/02/13- 07/16/13   right rbeast 4500 cGy 25 sessions, right breast boost 1600 cGy in 8 sessions  . Hyperlipidemia    Managed with both a statin and Welchol. Welchol stopped 2014 2/2 cost and started on fenofibrate   . Hypertension    2006 B renal arteries patent. 2003 MRA - no RAS. 2003 pheo W/U Dr Hassell Done reportedly negative.  . Lupus ([place])   . RBBB   . Stroke [facility])    Incidental finding MRI 2002 L lacunar infarct  . Wears dentures    top    Review of Systems:  Denies fevers, chills Denies nausea, vomiting,  diarrhea    Physical Exam: Vitals:   10/13/15 1033  BP: (!) 132/59  Pulse: 68  Temp: 98.1 F (36.7 C)  TempSrc: Oral  Weight: 182 lb 11.2 oz (82.9 kg)  Height: 5' 5"  (1.651 m)    General: A&O, in NAD CV: RRR, normal s1, s2, no m/r/g Resp: equal and symmetric breath sounds, no wheezing or crackles  Abdomen: soft, nontender, nondistended, +BS   Assessment & Plan:   See encounters tab for problem based medical decision making. Patient discussed with Dr. Beryle Beams

## 2015-10-13 NOTE — Patient Instructions (Addendum)
Thank you for your visit today As we discussed, pl do not take the fenofibrate  Please follow up if your diarrhea happens again- the order for the stool sample is already there so you can bring it to the lab .

## 2015-10-13 NOTE — Assessment & Plan Note (Signed)
After being discharged from the hospital, her diarrhea has resolved. She has stopped taking the fenofibrate. Denies any n/v.  -RTC if her diarrhea recurs

## 2015-10-13 NOTE — Assessment & Plan Note (Signed)
It is time for her monthly B12 injection  -Will receive b12 injection

## 2015-10-13 NOTE — Telephone Encounter (Signed)
Followed-up in clinic all concerns addressed at this time

## 2015-10-24 ENCOUNTER — Telehealth: Payer: Self-pay

## 2015-10-24 NOTE — Telephone Encounter (Signed)
Pt called about next months appt. Pt wants to r/s appt. Call transferred to scheduler.

## 2015-11-18 NOTE — Addendum Note (Signed)
Addended by: Truddie Crumble on: 11/18/2015 03:07 PM   Modules accepted: Orders

## 2015-11-22 ENCOUNTER — Telehealth: Payer: Self-pay | Admitting: Oncology

## 2015-11-22 NOTE — Telephone Encounter (Signed)
lvm to inform pt of r/s appt 9/18 to 9/25 to [facility] per [facility] 9/4 LOS. Asked pt to return call to confim appt changes

## 2015-11-24 DIAGNOSIS — M545 Low back pain: Secondary | ICD-10-CM | POA: Diagnosis not present

## 2015-11-28 ENCOUNTER — Ambulatory Visit: Payer: Medicare Other

## 2015-11-30 ENCOUNTER — Other Ambulatory Visit: Payer: Self-pay

## 2015-11-30 NOTE — Patient Outreach (Signed)
[facility]) Care Management  11/30/2015  MARGERET STACHNIK 01-14-1946 [id number]   REFERRAL DATE:  11/29/15  REFERRAL SOURCE: Primary MD referral REFERRAL REASON: Medication adherence, chronic disease management  Telephone call to patient regarding primary MD referral. Unable to reach patient. HIPAA compliant voice message left with call back phone number.   PLAN;  RNCM will attempt 2nd telephone outreach to patient within 1 week.   [name] RN,BSN,CCM [facility] Telephonic  [phone]

## 2015-12-01 ENCOUNTER — Other Ambulatory Visit: Payer: Self-pay

## 2015-12-01 ENCOUNTER — Ambulatory Visit: Payer: Medicare Other | Admitting: Oncology

## 2015-12-01 ENCOUNTER — Other Ambulatory Visit: Payer: Medicare Other

## 2015-12-01 NOTE — Patient Outreach (Addendum)
[facility]) Care Management  12/01/2015  [name] Aug 03, 1945 [id number]    REFERRAL DATE:  11/29/15    REFERRAL SOURCE: Primary MD referral REFERRAL REASON: Medication adherence, chronic disease management  Second telephone call to patient regarding primary MD referral. Unable to reach patient. Attempted telephone call to patient listed home number and listed mobile number.  HIPAA compliant voice messages left with call back phone number.   PLAN:  RNCM will attempt 3rd telephone call to patient within  1 week.   [name] RN,BSN,CCM [facility] Telephonic  [phone] '

## 2015-12-05 ENCOUNTER — Other Ambulatory Visit: Payer: Self-pay

## 2015-12-05 ENCOUNTER — Other Ambulatory Visit: Payer: Self-pay | Admitting: Internal Medicine

## 2015-12-05 ENCOUNTER — Other Ambulatory Visit: Payer: Self-pay | Admitting: Oncology

## 2015-12-05 ENCOUNTER — Ambulatory Visit: Payer: Medicare Other | Admitting: Oncology

## 2015-12-05 ENCOUNTER — Other Ambulatory Visit: Payer: Medicare Other

## 2015-12-05 DIAGNOSIS — M47816 Spondylosis without myelopathy or radiculopathy, lumbar region: Secondary | ICD-10-CM | POA: Diagnosis not present

## 2015-12-05 DIAGNOSIS — C50311 Malignant neoplasm of lower-inner quadrant of right female breast: Secondary | ICD-10-CM

## 2015-12-05 NOTE — Patient Outreach (Signed)
[facility]) Care Management  12/05/2015  [name] Aug 05, 1945 [id number]  REFERRAL DATE: 11/29/15 REFERRAL SOURCE: Primary MD referral REFERRAL REASON: Medication adherence, chronic disease management  Third telephone call to patient regarding primary MD referral.  Unable to reach.  HIPAA compliant voice message left with call back phone number.    PLAN:  RNCM will send patient outreach letter to attempt contact  [name] RN,BSN,CCM [facility] Telephonic  [phone]

## 2015-12-05 NOTE — Telephone Encounter (Signed)
Sept oct appt [name] DM F/U. Thanks

## 2015-12-07 ENCOUNTER — Other Ambulatory Visit: Payer: Self-pay | Admitting: Internal Medicine

## 2015-12-07 MED ORDER — COLCHICINE 0.6 MG PO TABS: 0.6000 mg | ORAL_TABLET | Freq: Every day | ORAL | 2 refills | Status: DC

## 2015-12-08 NOTE — Progress Notes (Signed)
[facility] called and Colchicine rx cancel per Dr Lynnae January.

## 2015-12-12 ENCOUNTER — Ambulatory Visit (HOSPITAL_BASED_OUTPATIENT_CLINIC_OR_DEPARTMENT_OTHER): Payer: Medicare Other | Admitting: Oncology

## 2015-12-12 ENCOUNTER — Other Ambulatory Visit (HOSPITAL_BASED_OUTPATIENT_CLINIC_OR_DEPARTMENT_OTHER): Payer: Medicare Other

## 2015-12-12 VITALS — BP 151/54 | HR 69 | Temp 98.1°F | Resp 18 | Ht 65.0 in | Wt 184.6 lb

## 2015-12-12 DIAGNOSIS — I10 Essential (primary) hypertension: Secondary | ICD-10-CM

## 2015-12-12 DIAGNOSIS — M1990 Unspecified osteoarthritis, unspecified site: Secondary | ICD-10-CM

## 2015-12-12 DIAGNOSIS — E119 Type 2 diabetes mellitus without complications: Secondary | ICD-10-CM

## 2015-12-12 DIAGNOSIS — C50311 Malignant neoplasm of lower-inner quadrant of right female breast: Secondary | ICD-10-CM

## 2015-12-12 DIAGNOSIS — E2839 Other primary ovarian failure: Secondary | ICD-10-CM

## 2015-12-12 DIAGNOSIS — D649 Anemia, unspecified: Secondary | ICD-10-CM

## 2015-12-12 DIAGNOSIS — N183 Chronic kidney disease, stage 3 (moderate): Secondary | ICD-10-CM

## 2015-12-12 LAB — CBC WITH DIFFERENTIAL/PLATELET
BASO%: 0.8 % (ref 0.0–2.0)
Basophils Absolute: 0.1 10*3/uL (ref 0.0–0.1)
EOS%: 1.2 % (ref 0.0–7.0)
Eosinophils Absolute: 0.1 10*3/uL (ref 0.0–0.5)
HCT: 30.5 % — ABNORMAL LOW (ref 34.8–46.6)
HGB: 9.7 g/dL — ABNORMAL LOW (ref 11.6–15.9)
LYMPH%: 23.6 % (ref 14.0–49.7)
MCH: 25.7 pg (ref 25.1–34.0)
MCHC: 32 g/dL (ref 31.5–36.0)
MCV: 80.4 fL (ref 79.5–101.0)
MONO#: 0.5 10*3/uL (ref 0.1–0.9)
MONO%: 7 % (ref 0.0–14.0)
NEUT#: 4.6 10*3/uL (ref 1.5–6.5)
NEUT%: 67.4 % (ref 38.4–76.8)
Platelets: 386 10*3/uL (ref 145–400)
RBC: 3.79 10*6/uL (ref 3.70–5.45)
RDW: 15.1 % — AB (ref 11.2–14.5)
WBC: 6.8 10*3/uL (ref 3.9–10.3)
lymph#: 1.6 10*3/uL (ref 0.9–3.3)

## 2015-12-12 LAB — COMPREHENSIVE METABOLIC PANEL
ALBUMIN: 3.6 g/dL (ref 3.5–5.0)
ALK PHOS: 66 U/L (ref 40–150)
ALT: 18 U/L (ref 0–55)
AST: 16 U/L (ref 5–34)
Anion Gap: 12 mEq/L — ABNORMAL HIGH (ref 3–11)
BUN: 42.4 mg/dL — ABNORMAL HIGH (ref 7.0–26.0)
CHLORIDE: 106 meq/L (ref 98–109)
CO2: 26 mEq/L (ref 22–29)
Calcium: 10 mg/dL (ref 8.4–10.4)
Creatinine: 2.4 mg/dL — ABNORMAL HIGH (ref 0.6–1.1)
EGFR: 23 mL/min/{1.73_m2} — AB (ref >90)
GLUCOSE: 68 mg/dL — AB (ref 70–140)
POTASSIUM: 4.1 meq/L (ref 3.5–5.1)
Sodium: 143 mEq/L (ref 136–145)
Total Bilirubin: <0.3 mg/dL (ref 0.20–1.20)
Total Protein: 8.9 g/dL — ABNORMAL HIGH (ref 6.4–8.3)

## 2015-12-12 LAB — FERRITIN: Ferritin: 77 ng/ml (ref 9–269)

## 2015-12-12 NOTE — Progress Notes (Signed)
OFFICE PROGRESS NOTE   December 12, 2015   Physicians: Humphrey Rolls, [place]); Larey Dresser; [place], Robert; [place]; [place], [place] Service, Honor Junes, J/ Mina Marble  INTERVAL HISTORY:  Patient is seen, alone for visit, in continuing attention to stage 1 infiltrating ductal carcinoma of right breast for which she continues arimidex, this begun 07-2013. Most recent bilateral 3D mammograms were at [facility] 02-17-15, with scattered fibroglandular densities and no mammographic findings of concern.  Last DEXA 08-26-13, was ordered for June 2017, but never completed. This is due to the fact that she remains on the Arimidex. Patient continues regular follow up with Dr Lynnae January as PCP.  The patient reports that she has been working out at gym with water aerobics, treadmill and bike 3x weekly. She does not mention discomfort from increasing lymphedema right breast, and is not aware of changes in breasts otherwise. She denies bleeding, increased SOB, new or different pain. Bowels ok. No abdominal or pelvic pain. No LE swelling.  Remainder of 10 point Review of Systems negative  No central catheter No genetics testing.  She will have flu vaccine elsewhere prior to end of Oct.  ONCOLOGIC HISTORY Screening mammogram on 02/06/2013.possibe mass in the right breast; 02/27/2013 diagnostic mammogram and ultrasound showed 6 x 5 mm mass at 5:00 position. Lymph nodes were not enlarged. Biopsy 03/16/2013 invasive ductal carcinoma, low grade, ER positive PR positive HER-2/neu negative with a proliferation marker Ki-67 of 20%.  Lumpectomy with sentinel lymph node biopsy on 05/12/13 with final pathology revealing:  Grade: II of III  Tumor size (gross measurement: 0.9 cm  Lymph nodes: 0/6  Breast prognostic profile:  Estrogen receptor: Not repeated, previous study demonstrated 100% positivity  Progesterone receptor: Not repeated, previous study demonstrated 12% positivity  Her 2 neu: Repeated, previous study  demonstrated no amplification (1.79)  Oncotype Dx testing score: Her Oncotype score was 19 giving her a 12% risk of distant recurrence with tamoxifen only for 5 years. She does not want chemotherapy.  Radiation given 06-02-13 thru 07-16-13 4500 Gy to right breast with boost 1600 Gy. Began Arimidex 08-06-2013  Anemia: present at least back to 2012. SPEP 03-2014 no M spike and hemoglobin electrophoresis 03-2014 was normal. Erythropoietin level was elevated at 37.8. Urinalysis 02-2014 was negative for blood. Iron and ferritin were at least low normal in 07-2013 and B12 high in 06-2012.   Objective:  Vital signs in last 24 hours:  BP (!) 151/54 (BP Location: Left Arm, Patient Position: Sitting) Comment: informed nurse  Pulse 69   Temp 98.1 F (36.7 C) (Oral)   Resp 18   Ht 5' 5"  (1.651 m)   Wt 184 lb 9.6 oz (83.7 kg)   SpO2 100%   BMI 30.72 kg/m  Weight down 2 lbs from 05-2015. Alert, oriented and appropriate. Ambulatory without assistance.  No alopecia  HEENT:PERRL, sclerae not icteric. Oral mucosa moist without lesions, posterior pharynx clear.  Neck supple. No JVD.  Lymphatics:no cervical,suraclavicular, axillary or inguinal adenopathy Resp: clear to auscultation bilaterally and normal percussion bilaterally Cardio: regular rate and rhythm. No gallop. GI: soft, nontender, not distended, no mass or organomegaly. Normally active bowel sounds.  Musculoskeletal/ Extremities: without pitting edema, cords, tenderness Neuro: speech fluent, nonfocal. PSYCH appropriate mood and affect Skin without rash, ecchymosis, petechiae Breasts: right with unremarkable lumpectomy scar, hyperpigmentation still in RT field, increased, marked lymphedema in dependent breast particularly, no erythema or tenderness. Otherwise bilaterally without dominant mass, skin or nipple findings. Axillae benign.   Lab Results:  Results  for orders placed or performed in visit on 12/12/15  CBC with Differential  Result  Value Ref Range   WBC 6.8 3.9 - 10.3 10e3/uL   NEUT# 4.6 1.5 - 6.5 10e3/uL   HGB 9.7 (L) 11.6 - 15.9 g/dL   HCT 30.5 (L) 34.8 - 46.6 %   Platelets 386 145 - 400 10e3/uL   MCV 80.4 79.5 - 101.0 fL   MCH 25.7 25.1 - 34.0 pg   MCHC 32.0 31.5 - 36.0 g/dL   RBC 3.79 3.70 - 5.45 10e6/uL   RDW 15.1 (H) 11.2 - 14.5 %   lymph# 1.6 0.9 - 3.3 10e3/uL   MONO# 0.5 0.1 - 0.9 10e3/uL   Eosinophils Absolute 0.1 0.0 - 0.5 10e3/uL   Basophils Absolute 0.1 0.0 - 0.1 10e3/uL   NEUT% 67.4 38.4 - 76.8 %   LYMPH% 23.6 14.0 - 49.7 %   MONO% 7.0 0.0 - 14.0 %   EOS% 1.2 0.0 - 7.0 %   BASO% 0.8 0.0 - 2.0 %  Comprehensive metabolic panel  Result Value Ref Range   Sodium 143 136 - 145 mEq/L   Potassium 4.1 3.5 - 5.1 mEq/L   Chloride 106 98 - 109 mEq/L   CO2 26 22 - 29 mEq/L   Glucose 68 (L) 70 - 140 mg/dl   BUN 42.4 (H) 7.0 - 26.0 mg/dL   Creatinine 2.4 (H) 0.6 - 1.1 mg/dL   Total Bilirubin <0.30 0.20 - 1.20 mg/dL   Alkaline Phosphatase 66 40 - 150 U/L   AST 16 5 - 34 U/L   ALT 18 0 - 55 U/L   Total Protein 8.9 (H) 6.4 - 8.3 g/dL   Albumin 3.6 3.5 - 5.0 g/dL   Calcium 10.0 8.4 - 10.4 mg/dL   Anion Gap 12 (H) 3 - 11 mEq/L   EGFR 23 (L) >90 ml/min/1.73 m2  Ferritin  Result Value Ref Range   Ferritin 77 9 - 269 ng/ml     Studies/Results: EXAM:  [facility] DIGITAL DIAGNOSTIC BILATERAL MAMMOGRAM WITH 3D TOMOSYNTHESIS AND CAD  COMPARISON: Previous exam(s).  ACR Breast Density Category b: There are scattered areas of fibroglandular density.  FINDINGS: There are stable postsurgical changes within the right breast. Biopsy site marker within the outer right breast is stable in position.  There are no new dominant masses, suspicious calcifications or secondary signs of malignancy within either breast.  Mammographic images were processed with CAD.  IMPRESSION: No mammographic evidence of malignancy within either breast. Stable postsurgical changes within the right  breast.  RECOMMENDATION: Bilateral diagnostic mammogram in 1 year.   Medications: I have reviewed the patient's current medications. Continue arimidex  DISCUSSION Patient is doing well on the Arimidex. She will continue this. Her annual mammogram was ordered today for December 2017. She has not yet had her DEXA which was due in June 2017 and I have reordered this to be done in December along with her mammogram. Encouraged her to continue regular weight bearing exercise and water aerobics Her glucose was noted to be low today and she has not eaten lunch yet. She was given graham crackers and a small ginger ale here in our office. She is asymptomatic. Creatinine is up slightly to 2.4 I will forward this to her primary care provider who is watching this.  Assessment/Plan: 1. T1N0 invasive ductal carcinoma with DCIS of right breast: post lumpectomy and 6 sentinel nodes 04-22-2013, ER and PR +, HER 2 negative, local radiation and on arimidex  since 07-2013 which she will continue. Fat necrosis by biopsy right breast 07-2014. Bilateral mammograms due Dec 2017 and DEXA was due June 2017, but not done. A repeat DEXA was entered into Epic to be done in December along with her mammogram. Follow-up in 6 months with labs, or sooner if needed 2.anemia: back at least to 2012. Appears likely from CKD and other chronic disease by evaluation as noted above and seems asymptomatic. She is taking ferrous fumarate as ordered; ferritin level is normal today; question of ileocolectomy B 12 level is pending. Other counts ok 3.low back, right hip and RLE pain/ stiffness from degenerative arthritis and spinal stenosis. Orthopedics involved. 4.sclerotic changes in lumbar spine on DEXA, in region of laminectomy.  5.diabetes on insulin managed by PCP 6.chronic renal insufficiency , CKD III 7.. CAD, HTN: followed by Dr Johnsie Cancel  8..post right colectomy (or ileocolectomy). SBO 08-2013 thought from adhesions, resolved with bowel  rest.    All questions answered. Time spent 30 min including >50% counseling and coordination of care.   Mikey Bussing, NP   12/12/2015, 4:10 PM

## 2015-12-13 LAB — VITAMIN B12

## 2015-12-14 ENCOUNTER — Telehealth: Payer: Self-pay | Admitting: Internal Medicine

## 2015-12-14 NOTE — Telephone Encounter (Signed)
A. REMINDER CALL, LMTCB

## 2015-12-15 ENCOUNTER — Encounter: Payer: Self-pay | Admitting: Internal Medicine

## 2015-12-15 ENCOUNTER — Ambulatory Visit (INDEPENDENT_AMBULATORY_CARE_PROVIDER_SITE_OTHER): Payer: Medicare Other | Admitting: Internal Medicine

## 2015-12-15 VITALS — BP 140/64 | HR 64 | Wt 185.1 lb

## 2015-12-15 DIAGNOSIS — I10 Essential (primary) hypertension: Secondary | ICD-10-CM

## 2015-12-15 DIAGNOSIS — N179 Acute kidney failure, unspecified: Secondary | ICD-10-CM | POA: Diagnosis not present

## 2015-12-15 DIAGNOSIS — E1165 Type 2 diabetes mellitus with hyperglycemia: Secondary | ICD-10-CM | POA: Diagnosis not present

## 2015-12-15 DIAGNOSIS — IMO0002 Reserved for concepts with insufficient information to code with codable children: Secondary | ICD-10-CM

## 2015-12-15 DIAGNOSIS — Z794 Long term (current) use of insulin: Secondary | ICD-10-CM

## 2015-12-15 DIAGNOSIS — I129 Hypertensive chronic kidney disease with stage 1 through stage 4 chronic kidney disease, or unspecified chronic kidney disease: Secondary | ICD-10-CM

## 2015-12-15 DIAGNOSIS — E1122 Type 2 diabetes mellitus with diabetic chronic kidney disease: Secondary | ICD-10-CM | POA: Diagnosis not present

## 2015-12-15 DIAGNOSIS — N183 Chronic kidney disease, stage 3 unspecified: Secondary | ICD-10-CM

## 2015-12-15 DIAGNOSIS — N189 Chronic kidney disease, unspecified: Secondary | ICD-10-CM

## 2015-12-15 DIAGNOSIS — M109 Gout, unspecified: Secondary | ICD-10-CM

## 2015-12-15 LAB — GLUCOSE, CAPILLARY: Glucose-Capillary: 171 mg/dL — ABNORMAL HIGH (ref 65–99)

## 2015-12-15 LAB — POCT GLYCOSYLATED HEMOGLOBIN (HGB A1C): Hemoglobin A1C: 7.7

## 2015-12-15 NOTE — Progress Notes (Signed)
   Subjective:    Patient ID: [name], female    DOB: 1945-07-21, 70 y.o.   MRN: [id number]  HPI  [name] is here for lab follow up. Please see the A&P for the status of the pt's chronic medical problems.  ROS : per ROS section and in problem oriented charting. All other systems are negative.  PMHx, Soc hx, and / or Fam hx : Daughter is a NP. Still doing Zumba.  Review of Systems  Constitutional: Negative for activity change and unexpected weight change.  Cardiovascular: Positive for leg swelling.  Endocrine: Positive for polyuria.  Genitourinary: Positive for frequency. Negative for decreased urine volume.  Musculoskeletal: Positive for arthralgias, back pain and gait problem.       Objective:   Physical Exam  Constitutional: She appears well-developed and well-nourished. No distress.  HENT:  Head: Normocephalic and atraumatic.  Right Ear: External ear normal.  Left Ear: External ear normal.  Nose: Nose normal.  Eyes: Conjunctivae and EOM are normal. Right eye exhibits no discharge. Left eye exhibits no discharge. No scleral icterus.  Musculoskeletal: Normal range of motion.  Trace edema on L. No pain on MCP manipulation.  Neurological: She is alert.  Skin: Skin is warm and dry. She is not diaphoretic.  Psychiatric: She has a normal mood and affect. Her behavior is normal. Judgment and thought content normal.          Assessment & Plan:

## 2015-12-15 NOTE — Patient Instructions (Addendum)
1. You gave me permission today to give your health information to your daughters [name] and [name] and to [place] 2. I am rechecking your kidney function. I will call you tomorrow with the results and instructions on your lasix and lisinopril. 3. In 2007, your baseline creatinine was 1.0. In 2011, it was 1.3. Recently, it was 1.4 - 1.8. 4. The best way to prevent further kidney worsening is controlling your blood pressure and diabetes as you are doing. 5. Overall, you have lost 13 lbs in one and a half years. That will help enormously.  6. Once you know your kidney function tomorrow, you can let me know your final decision on whether you want to see the kidney doctors. 7. Once I know your kidney function, I will plan a way to prevent your gout flares. 8. We will develop a plan to prevent your sugar from spiking with the next back injection.

## 2015-12-16 LAB — PTH, INTACT AND CALCIUM
CALCIUM: 9.8 mg/dL (ref 8.7–10.3)
PTH: 116 pg/mL — AB (ref 15–65)

## 2015-12-16 LAB — BMP8+ANION GAP
ANION GAP: 18 mmol/L (ref 10.0–18.0)
BUN / CREAT RATIO: 21 (ref 12–28)
BUN: 46 mg/dL — ABNORMAL HIGH (ref 8–27)
CO2: 23 mmol/L (ref 18–29)
Calcium: 9.6 mg/dL (ref 8.7–10.3)
Chloride: 97 mmol/L (ref 96–106)
Creatinine, Ser: 2.17 mg/dL — ABNORMAL HIGH (ref 0.57–1.00)
GFR calc Af Amer: 26 mL/min/{1.73_m2} — ABNORMAL LOW (ref >59)
GFR calc non Af Amer: 23 mL/min/{1.73_m2} — ABNORMAL LOW (ref >59)
Glucose: 176 mg/dL — ABNORMAL HIGH (ref 65–99)
Potassium: 4.3 mmol/L (ref 3.5–5.2)
SODIUM: 138 mmol/L (ref 134–144)

## 2015-12-16 LAB — PHOSPHORUS: Phosphorus: 3.4 mg/dL (ref 2.5–4.5)

## 2015-12-16 LAB — MAGNESIUM: Magnesium: 1.8 mg/dL (ref 1.6–2.3)

## 2015-12-16 NOTE — Assessment & Plan Note (Signed)
Her Cr has very slowly trended up. 2007 1.0. 2011, it was 1.3. Recently, it was 1.4 - 1.8 as baseline. Had been 1.1 and then onc checked and 2.4 (a 50% increase from baseline using 1.6 is her baseline). She held her ACE and lasix. She has not taken an NSAID for over one week. She is well hydrated. On recheck 9/29 it was 2.19 (a 36% increase) - lower but not as low as I had hoped. MAg, Phos, calcium nl. Intact PTH pending.  A : acute on chronic renal failure. Most likely cause is diabetic / hypertensive although both are fairly well controlled. NSAID stopped one week prior. Doubt obstructive. Last bladder / renal imaging 2015. Definitely not pre-renal as well hydrated and diuretic held. No indication of secondary hyperpara.   PLAN : recheck Cr 10/2 or 10/3. Cont to hold ACE, diuretic, and NSAID. Renal U/S. OK with nephrology referral although I remain hopeful Cr will cont to trend down, closer to baseline of 1.6.

## 2015-12-16 NOTE — Assessment & Plan Note (Addendum)
.   BP Readings from Last 3 Encounters:  12/15/15 140/64  12/12/15 (!) 151/54  10/13/15 (!) 132/59   BP high today bc off Lisinopril and lasix 2/2 rise in Cr. Cr trending down but not at baseline. Will not yet resume ACE (has proteinuria) but make lasix only PRN for sig edema. Recheck BP and BMP Monday Oct 2nd.  PLAN : Hold ACE. Lasix PRN

## 2015-12-16 NOTE — Assessment & Plan Note (Addendum)
A1C 7.4 - 7.7. She is levemir 3 and prandin 2 mg TID with meals. Overall, her A1C change is not sig. UTD on DM metrics. To get steroid injection on 5th.  PLAN:  Cont current meds Notify Don[place] to start steroid induced hyperglycemia protocol.

## 2015-12-16 NOTE — Assessment & Plan Note (Signed)
We had discussed prophylaxis previously and at that time gout not bothersome enough that Ms Faraci wanted to take a daily med. Pattern now worse - flair in L MTP joint every 3 -4 weeks. Comes on overnight and is painful to touch / bear weight. Takes Aleve or advil for 1-3 days and sxs completely resolve. No crystal dx as sxs classic. Has been treating with short (< 3 days) courses of NSAID about once weekly. NSAID not an option now due to renal fxn. All prophylactic meds requires min 6 months concurrent tx with NSAID / Colchicine (causes D) / or prednisone - all contraindicated. I will cont to try to ID a good option for proylaxis. If she flares in meantime, would need tx with either oral or intraarticular steroid.  PLAN : consider change from ACE to losartan once ARB can be safely restarted. Uric acid level on 2nd If I cannot find good option, may need referral to rheum.

## 2015-12-19 ENCOUNTER — Telehealth: Payer: Self-pay | Admitting: Oncology

## 2015-12-19 ENCOUNTER — Other Ambulatory Visit (INDEPENDENT_AMBULATORY_CARE_PROVIDER_SITE_OTHER): Payer: Medicare Other

## 2015-12-19 DIAGNOSIS — N179 Acute kidney failure, unspecified: Secondary | ICD-10-CM | POA: Diagnosis not present

## 2015-12-19 DIAGNOSIS — N183 Chronic kidney disease, stage 3 (moderate): Secondary | ICD-10-CM | POA: Diagnosis not present

## 2015-12-19 DIAGNOSIS — N189 Chronic kidney disease, unspecified: Secondary | ICD-10-CM | POA: Diagnosis not present

## 2015-12-19 DIAGNOSIS — M109 Gout, unspecified: Secondary | ICD-10-CM

## 2015-12-19 NOTE — Telephone Encounter (Signed)
LEFT MESSAGE FOR PATIENT RE LAB/FU April AND MAMMO/BONE DENSITY 12/7 @ [facility]. SCHEDULE MAILED.

## 2015-12-20 LAB — BMP8+ANION GAP
Anion Gap: 14 mmol/L (ref 10.0–18.0)
BUN / CREAT RATIO: 22 (ref 12–28)
BUN: 37 mg/dL — ABNORMAL HIGH (ref 8–27)
CO2: 23 mmol/L (ref 18–29)
Calcium: 9.7 mg/dL (ref 8.7–10.3)
Chloride: 102 mmol/L (ref 96–106)
Creatinine, Ser: 1.65 mg/dL — ABNORMAL HIGH (ref 0.57–1.00)
GFR calc non Af Amer: 32 mL/min/{1.73_m2} — ABNORMAL LOW (ref >59)
GFR, EST AFRICAN AMERICAN: 37 mL/min/{1.73_m2} — AB (ref >59)
Glucose: 166 mg/dL — ABNORMAL HIGH (ref 65–99)
POTASSIUM: 4.5 mmol/L (ref 3.5–5.2)
SODIUM: 139 mmol/L (ref 134–144)

## 2015-12-20 LAB — URIC ACID: Uric Acid: 9.3 mg/dL — ABNORMAL HIGH (ref 2.5–7.1)

## 2015-12-21 ENCOUNTER — Telehealth: Payer: Self-pay | Admitting: Dietician

## 2015-12-21 NOTE — Telephone Encounter (Signed)
Left message for patient that if she is put on steroids this week to test her blood sugar before meals.

## 2015-12-22 DIAGNOSIS — M47816 Spondylosis without myelopathy or radiculopathy, lumbar region: Secondary | ICD-10-CM | POA: Diagnosis not present

## 2015-12-22 NOTE — Telephone Encounter (Signed)
Spoke with patient who  Got a steroid injection this morning. She has not checked her blood sugar yet. We agreed that she'd check her blood sugar tonight and tomorrow morning and afternoon and we'd talk tomorrow in the afternoon to determine if her diabetes medicine needs to be adjusted.

## 2015-12-23 ENCOUNTER — Other Ambulatory Visit: Payer: Self-pay | Admitting: Internal Medicine

## 2015-12-23 MED ORDER — LOSARTAN POTASSIUM 25 MG PO TABS: 25.0000 mg | ORAL_TABLET | Freq: Every day | ORAL | 5 refills | Status: DC

## 2015-12-23 NOTE — Telephone Encounter (Signed)
[name] called to let us know her blood sugar is 105 this afternoon and has been down for the last 5-6 hours. She thanked [place] for calling and said she thought she'd be fine.

## 2015-12-28 ENCOUNTER — Other Ambulatory Visit: Payer: Self-pay

## 2015-12-28 NOTE — Patient Outreach (Addendum)
[facility]) Care Management  12/28/2015  [name] 1945-04-10 [id number]  No response from patient after 3 telephone calls and outreach letter attempt.    PLAN; RNCM will refer to [facility] care management assistant to close due to inability to establish contact with patient.  RNCM will notify patients listed primary MD of closure.   [name] RN,BSN,CCM [facility] Telephonic  [phone]

## 2016-01-05 ENCOUNTER — Ambulatory Visit: Payer: Medicare Other | Admitting: Internal Medicine

## 2016-01-09 ENCOUNTER — Telehealth: Payer: Self-pay | Admitting: *Deleted

## 2016-01-09 ENCOUNTER — Ambulatory Visit (HOSPITAL_COMMUNITY)
Admission: RE | Admit: 2016-01-09 | Discharge: 2016-01-09 | Disposition: A | Discharge status: Home / Self Care | Payer: Medicare Other | Source: Ambulatory Visit | Attending: Internal Medicine | Admitting: Internal Medicine

## 2016-01-09 DIAGNOSIS — N281 Cyst of kidney, acquired: Secondary | ICD-10-CM | POA: Diagnosis not present

## 2016-01-09 DIAGNOSIS — N179 Acute kidney failure, unspecified: Secondary | ICD-10-CM | POA: Diagnosis not present

## 2016-01-09 DIAGNOSIS — N189 Chronic kidney disease, unspecified: Secondary | ICD-10-CM | POA: Diagnosis not present

## 2016-01-09 DIAGNOSIS — N1330 Unspecified hydronephrosis: Secondary | ICD-10-CM | POA: Diagnosis not present

## 2016-01-09 DIAGNOSIS — M47816 Spondylosis without myelopathy or radiculopathy, lumbar region: Secondary | ICD-10-CM | POA: Diagnosis not present

## 2016-01-09 NOTE — Telephone Encounter (Signed)
I reviewed Dr Zenovia Jarred last note.  I would prefer we not use oral steroids if possible given uncontrolled DM.  Could we get her on the [facility] list for tomorrow morning and I will preform a steroid injection?

## 2016-01-09 NOTE — Telephone Encounter (Signed)
Called pt and let her know dr Software engineer is not available at present and the note has been sent to the attending, we will rtc as soon as we hear

## 2016-01-09 NOTE — Telephone Encounter (Signed)
Received call from pt stating c/o right foot pain/swelling.  Symptoms started about 3 days ago and is similar to gout pain she had previously in the left foot.  Pt states pcp was going to try prednisone if pain returned. Pt's pcp unavailable-will forward to attending for review-see 09/28 ov w/DrButcher.Despina Hidden Cassady10/23/20174:24 PM

## 2016-01-10 ENCOUNTER — Encounter: Payer: Self-pay | Admitting: Internal Medicine

## 2016-01-10 ENCOUNTER — Ambulatory Visit (INDEPENDENT_AMBULATORY_CARE_PROVIDER_SITE_OTHER): Payer: Medicare Other | Admitting: Internal Medicine

## 2016-01-10 VITALS — BP 147/59 | HR 72 | Temp 98.5°F | Ht 64.0 in | Wt 187.2 lb

## 2016-01-10 DIAGNOSIS — N183 Chronic kidney disease, stage 3 (moderate): Secondary | ICD-10-CM

## 2016-01-10 DIAGNOSIS — Z794 Long term (current) use of insulin: Secondary | ICD-10-CM

## 2016-01-10 DIAGNOSIS — E1149 Type 2 diabetes mellitus with other diabetic neurological complication: Secondary | ICD-10-CM

## 2016-01-10 DIAGNOSIS — E1122 Type 2 diabetes mellitus with diabetic chronic kidney disease: Secondary | ICD-10-CM

## 2016-01-10 DIAGNOSIS — E1165 Type 2 diabetes mellitus with hyperglycemia: Secondary | ICD-10-CM

## 2016-01-10 DIAGNOSIS — M10371 Gout due to renal impairment, right ankle and foot: Secondary | ICD-10-CM | POA: Diagnosis not present

## 2016-01-10 DIAGNOSIS — IMO0002 Reserved for concepts with insufficient information to code with codable children: Secondary | ICD-10-CM

## 2016-01-10 NOTE — Progress Notes (Signed)
[facility] Subjective:  HPI: [name] is a 70 y.o. female who presents for right toe pain.  She presents with 10/10 right 1st MTP pain that started when she woke up yesterday.  Pain interferes with ambulation.  Pain does not radiate.  No trauma to area.  She has a history of gout with frequent attacks.  She has never had arthrocentesis of the joint or crystal confirmation of her gout.  Her PCP is currently trying to get her started on urate lowering therapy.  She previously has tried colcrys but caused diarrhea.  She was taking NSAIDs but recently has had increased kidney dysfunction and this is being avoided.  She does have IDDM and last A1c was uncontrolled at 7.4%      Review of Systems: Review of Systems  Constitutional: Negative for chills and fever.  Eyes: Negative for blurred vision.  Skin: Negative for rash.    Objective:  Physical Exam: Vitals:   01/10/16 1412  BP: (!) 147/59  Pulse: 72  Temp: 98.5 F (36.9 C)  TempSrc: Oral  SpO2: 99%  Weight: 187 lb 3.2 oz (84.9 kg)  Height: 5' 4"  (1.626 m)  Physical Exam  Constitutional: She is well-developed, well-nourished, and in no distress.  Musculoskeletal:  Right 1st MTP, warm and tender to touch.  Decreased ROM due to pain.  No erythema over the joint.  Nursing note and vitals reviewed.  Assessment & Plan:  Gout A: Acute gout attack of right 1st MTP  P: Avoid NSAIDS due to CKD Avoid systemic steroids due to uncontrolled DM Patient agreeable for arthrocentesis and steroid injection, which was preformed today.  No synovial fluid was obtained but pain did have pain relief with injection of lidocaine/kenolog. Continue Losartan  Controlled diabetes mellitus with neurologic complication, with long-term current use of insulin (HCC) A: Uncontrolled DM with CKD3  P: Intraarticular steroid was injected today,  I asked that she monitor her blood sugar and call clinic if sugars rise pre meal  above 200.  DM (diabetes mellitus), type 2, uncontrolled, with renal complications (HCC) A: Uncontrolled DM with CKD3  P: Intraarticular steroid was injected today,  I asked that she monitor her blood sugar and call clinic if sugars rise pre meal above 200.  I discussed the procedure risks and benefits specifically the risk of infection, bleeding, skin necrosis, hyperglycemia.  After consent was obtained, using sterile technique the right 1st MTP was prepped and ehtyl chloride was used as anesthetic. The joint was entered with a 25 guage 1.5 in needle, I attempted to aspirate without obtaining synoval fluid, I repositioned and again attempted but did not withdrawal fluid. 40 mg of kenalog and 1 ml plain Lidocaine was then injected and the needle withdrawn.  The procedure was well tolerated.  Patient reported immediate pain relief. The patient is asked to continue to rest the joint for a few more days before resuming regular activities.  It may be more painful for the first 1-2 days.  Watch for fever, or increased swelling or persistent pain in the joint. Call or return to clinic prn if such symptoms occur or there is failure to improve as anticipated.  Medications Ordered No orders of the defined types were placed in this encounter.  Other Orders No orders of the defined types were placed in this encounter.  Follow Up: Return if symptoms worsen or fail to improve.

## 2016-01-10 NOTE — Assessment & Plan Note (Signed)
A: Acute gout attack of right 1st MTP  P: Avoid NSAIDS due to CKD Avoid systemic steroids due to uncontrolled DM Patient agreeable for arthrocentesis and steroid injection, which was preformed today.  No synovial fluid was obtained but pain did have pain relief with injection of lidocaine/kenolog. Continue Losartan

## 2016-01-10 NOTE — Assessment & Plan Note (Signed)
A: Uncontrolled DM with CKD3  P: Intraarticular steroid was injected today,  I asked that she monitor her blood sugar and call clinic if sugars rise pre meal above 200.

## 2016-01-10 NOTE — Patient Instructions (Signed)
I want you to take it easy for the next 3 days.  Please let me know if you have increased redness, pain, skin changes, or fevers.  Please monitor you blood sugar and call if it goes too high.

## 2016-01-12 DIAGNOSIS — N183 Chronic kidney disease, stage 3 (moderate): Secondary | ICD-10-CM | POA: Diagnosis not present

## 2016-01-12 DIAGNOSIS — E1140 Type 2 diabetes mellitus with diabetic neuropathy, unspecified: Secondary | ICD-10-CM | POA: Diagnosis not present

## 2016-01-12 DIAGNOSIS — N1330 Unspecified hydronephrosis: Secondary | ICD-10-CM | POA: Diagnosis not present

## 2016-01-12 DIAGNOSIS — I129 Hypertensive chronic kidney disease with stage 1 through stage 4 chronic kidney disease, or unspecified chronic kidney disease: Secondary | ICD-10-CM | POA: Diagnosis not present

## 2016-01-12 DIAGNOSIS — N2581 Secondary hyperparathyroidism of renal origin: Secondary | ICD-10-CM | POA: Diagnosis not present

## 2016-01-16 ENCOUNTER — Other Ambulatory Visit: Payer: Self-pay | Admitting: Nephrology

## 2016-01-16 DIAGNOSIS — N183 Chronic kidney disease, stage 3 unspecified: Secondary | ICD-10-CM

## 2016-01-30 ENCOUNTER — Ambulatory Visit
Admission: RE | Admit: 2016-01-30 | Discharge: 2016-01-30 | Disposition: A | Discharge status: Home / Self Care | Payer: Medicare Other | Source: Ambulatory Visit | Attending: Nephrology | Admitting: Nephrology

## 2016-01-30 DIAGNOSIS — N183 Chronic kidney disease, stage 3 unspecified: Secondary | ICD-10-CM

## 2016-02-02 ENCOUNTER — Ambulatory Visit (INDEPENDENT_AMBULATORY_CARE_PROVIDER_SITE_OTHER): Payer: Medicare Other | Admitting: Internal Medicine

## 2016-02-02 ENCOUNTER — Encounter: Payer: Self-pay | Admitting: Internal Medicine

## 2016-02-02 VITALS — BP 151/59 | HR 95 | Temp 98.4°F | Ht 65.0 in | Wt 189.9 lb

## 2016-02-02 DIAGNOSIS — E1165 Type 2 diabetes mellitus with hyperglycemia: Secondary | ICD-10-CM | POA: Diagnosis not present

## 2016-02-02 DIAGNOSIS — Z79899 Other long term (current) drug therapy: Secondary | ICD-10-CM

## 2016-02-02 DIAGNOSIS — M10071 Idiopathic gout, right ankle and foot: Secondary | ICD-10-CM

## 2016-02-02 DIAGNOSIS — Z794 Long term (current) use of insulin: Secondary | ICD-10-CM

## 2016-02-02 DIAGNOSIS — M1A3710 Chronic gout due to renal impairment, right ankle and foot, without tophus (tophi): Secondary | ICD-10-CM

## 2016-02-02 DIAGNOSIS — I10 Essential (primary) hypertension: Secondary | ICD-10-CM

## 2016-02-02 DIAGNOSIS — M10371 Gout due to renal impairment, right ankle and foot: Secondary | ICD-10-CM | POA: Diagnosis not present

## 2016-02-02 DIAGNOSIS — M109 Gout, unspecified: Secondary | ICD-10-CM | POA: Diagnosis not present

## 2016-02-02 DIAGNOSIS — E1142 Type 2 diabetes mellitus with diabetic polyneuropathy: Secondary | ICD-10-CM | POA: Diagnosis not present

## 2016-02-02 MED ORDER — GABAPENTIN 300 MG PO CAPS: 300.0000 mg | ORAL_CAPSULE | Freq: Two times a day (BID) | ORAL | 3 refills | Status: DC

## 2016-02-02 MED ORDER — COLCHICINE 0.6 MG PO TABS: 0.6000 mg | ORAL_TABLET | Freq: Every day | ORAL | 2 refills | Status: DC

## 2016-02-02 NOTE — Progress Notes (Signed)
CC: Gout  HPI:  [name] is a 70 y.o. woman with PMHx as noted below who presents today for follow up of her gout.  Gout: She had a recent flare in October in her right 1st MTP joint for which she received an intraarticular steroid injection. She was seen by Nephrology and recommended to switch from Allopurinol to Uloric. Patient states she was told she needed blood work today. She denies any redness, swelling, or pain in her joints. She reports she has not been taking Colchicine because she was told to stop taking this medication.   HTN: BP mildly elevated at 151/59. She is taking Amlodipine 10 mg daily. Losartan 25 mg daily, and recently switched from Atenolol to Carvedilol but does not know which dose.   Diabetic Peripheral Neuropathy: Reports having constant pain in her bilateral feet. Describes pain as stabbing and has made walking very uncomfortable for her. She is taking Gabapentin 300 mg QHS which provides relief at night. She notes her blood sugars have been in good range in the morning (70s-100s) but much higher in the evenings (200s-300s).   Past Medical History:  Diagnosis Date  . Abdominal discomfort    Chronic N/V/D. Presumptive dx Crohn's dx per elevated p ANCA. Failed Entocort and Pentasa. Sep 2003 - ileocolectomy c anastomosis per Dr Deon Pilling 2/2 adhesions - path was hegative for Crohns. EGD, Sm bowel follow through (11/03), and an eteroclysis (10/03) were unrevealing. Cuases hypomag and hypocalcemia.  . Adnexal mass 8/03   s/p lap BSO (R ovarian fibroma) & lysis of adhesions  . Allergy    Seasonal  . Anemia    Multifactorial. Baseline HgB 10-11 ish. B12 def - 150 in 3/10. Fe Def - ferritin 35 3/10. Both are being repleted.  . Breast cancer ([place]) 03/16/13   right, 5 o'clock  . CAD (coronary artery disease) 1996   1996 - PTCA and angioplasty diagonal branch. 2000 - Rotoblator & angiopllasty of diagonal. 2006 - subendocardial AMI, DES to proximal LAD.Marland Kitchen Also had 90%  stenosis in distal apical LAD. EF 55 with apical hypokinesis. Indefinite ASA and Plavix.  . Chronic kidney disease    Chronic renal insuff baseline Cr 1.2 - 1.4 ish.  . Chronic pain    CT 10/10 = Spinal stenosis L2 - S1.  . Diabetes mellitus    Insulin dependent  . Hx of radiation therapy 06/02/13- 07/16/13   right rbeast 4500 cGy 25 sessions, right breast boost 1600 cGy in 8 sessions  . Hyperlipidemia    Managed with both a statin and Welchol. Welchol stopped 2014 2/2 cost and started on fenofibrate   . Hypertension    2006 B renal arteries patent. 2003 MRA - no RAS. 2003 pheo W/U Dr Hassell Done reportedly negative.  . Lupus   . RBBB   . Stroke [facility])    Incidental finding MRI 2002 L lacunar infarct  . Wears dentures    top    Review of Systems:  All negative except per HPI  Physical Exam:  Vitals:   02/02/16 1600  BP: (!) 151/59  Pulse: 95  Temp: 98.4 F (36.9 C)  TempSrc: Oral  SpO2: 100%  Weight: 189 lb 14.4 oz (86.1 kg)  Height: 5' 5"  (1.651 m)   General: alert, cooperative, pleasant HEENT: [place]/AT, EOMI, sclera anicteric, mucus membranes moist CV: RRR, no m/g/r Pulm: CTA bilaterally, breaths non-labored Ext: warm, trace peripheral edema bilaterally. No tenderness, swelling, erythema of hand/foot joints  Neuro: alert and oriented  x 3, decreased sensation to pinprick in bilateral toes and feet up to ankles, left side worse than right.   Assessment & Plan:   See Encounters Tab for problem based charting.  Patient discussed with Dr. Eppie Gibson

## 2016-02-02 NOTE — Progress Notes (Signed)
Case discussed with Dr. Arcelia Jew at the time of the visit.  We reviewed the resident's history and exam and pertinent patient test results.  I agree with the assessment, diagnosis, and plan of care documented in the resident's note.

## 2016-02-02 NOTE — Assessment & Plan Note (Addendum)
BP mildly elevated. She does not know what dose of Carvedilol she is taking. I advised her to bring her medication bottles to her next visit so we can verify dosing. Can consider increasing Carvedilol if not maxed out. Continue Amlodipine and Losartan daily.

## 2016-02-02 NOTE — Patient Instructions (Signed)
General Instructions: - We will check your liver enzymes today before we can start Uloric for your gout - It is very important that you take your Colchicine daily because Uloric can cause flares if you are not taking your Colchicine - Increase Gabapentin to 300 mg twice daily  Please bring your medicines with you each time you come to clinic.  Medicines may include prescription medications, over-the-counter medications, herbal remedies, eye drops, vitamins, or other pills.   Progress Toward Treatment Goals:  Treatment Goal 07/01/2012  Hemoglobin A1C unchanged  Blood pressure at goal    Self Care Goals & Plans:  Self Care Goal 10/03/2015  Manage my medications take my medicines as prescribed; bring my medications to every visit; refill my medications on time  Monitor my health keep track of my blood glucose; bring my glucose meter and log to each visit  Eat healthy foods eat more vegetables; eat foods that are low in salt; eat baked foods instead of fried foods  Be physically active take a walk every day  Meeting treatment goals -    Home Blood Glucose Monitoring 07/01/2012  Check my blood sugar 2 times a day  When to check my blood sugar before breakfast; after dinner     Care Management & Community Referrals:  Referral 07/01/2012  Referrals made for care management support diabetes educator

## 2016-02-02 NOTE — Assessment & Plan Note (Signed)
She has significant loss of feeling on her bilateral feet to level of her ankles. Her pain seems most consistent with diabetic neuropathy. Will have her increase her Gabapentin to 300 mg BID to see if this helps her pain. This is at the limit she can take based on her kidney function.

## 2016-02-02 NOTE — Assessment & Plan Note (Signed)
It was unclear what labwork patient needed initially, but based on notes I think she needed LFTs to determine if she can be started on Uloric for gout prophylaxis. She has not been taking Colchicine daily and she has a high chance of developing a gout flare if Uloric were to be started while not on Colchicine. I advised her to restart Colchicine 0.6 mg daily. Will have her follow up in 2 weeks to check bmet to make sure her kidney function is stable. Colchicine ok to continue with CrCl >30.

## 2016-02-03 LAB — CMP14 + ANION GAP
A/G RATIO: 1.1 — AB (ref 1.2–2.2)
ALT: 18 IU/L (ref 0–32)
ANION GAP: 18 mmol/L (ref 10.0–18.0)
AST: 14 IU/L (ref 0–40)
Albumin: 4.2 g/dL (ref 3.6–4.8)
Alkaline Phosphatase: 77 IU/L (ref 39–117)
BUN/Creatinine Ratio: 25 (ref 12–28)
BUN: 53 mg/dL — AB (ref 8–27)
Bilirubin Total: <0.2 mg/dL (ref 0.0–1.2)
CALCIUM: 9.7 mg/dL (ref 8.7–10.3)
CO2: 24 mmol/L (ref 18–29)
CREATININE: 2.16 mg/dL — AB (ref 0.57–1.00)
Chloride: 99 mmol/L (ref 96–106)
GFR, EST AFRICAN AMERICAN: 26 mL/min/{1.73_m2} — AB (ref >59)
GFR, EST NON AFRICAN AMERICAN: 23 mL/min/{1.73_m2} — AB (ref >59)
GLUCOSE: 169 mg/dL — AB (ref 65–99)
Globulin, Total: 3.8 g/dL (ref 1.5–4.5)
POTASSIUM: 4.4 mmol/L (ref 3.5–5.2)
Sodium: 141 mmol/L (ref 134–144)
TOTAL PROTEIN: 8 g/dL (ref 6.0–8.5)

## 2016-02-06 ENCOUNTER — Telehealth: Payer: Self-pay | Admitting: Internal Medicine

## 2016-02-06 NOTE — Telephone Encounter (Signed)
Informed patient of lab results. Advised to stop Colchicine with decreased GFR.

## 2016-02-10 ENCOUNTER — Other Ambulatory Visit: Payer: Self-pay | Admitting: Internal Medicine

## 2016-02-14 ENCOUNTER — Telehealth: Payer: Self-pay

## 2016-02-14 ENCOUNTER — Telehealth: Payer: Self-pay | Admitting: *Deleted

## 2016-02-14 NOTE — Telephone Encounter (Signed)
[facility], dr Hollie Salk started pt on urloric and prednisone 2 days ago, pt not sure of doses. This is per pt telling gladysh.

## 2016-02-14 NOTE — Telephone Encounter (Signed)
[name] is a 70 y.o. female who was contacted on behalf of [facility] Geriatrics Task Force. Patient reports that she has had some difficulty affording Levemir and only has ~2 days supply left. Instructed patient to see Dr. Maudie Mercury and I after she is seen in [facility] for samples to assist.

## 2016-02-14 NOTE — Telephone Encounter (Signed)
Medication Samples have been provided to the patient.  Drug: Levemir  Strength: U-100  Qty: 2 LOT: [id number] Exp.Date: 12/2016 Dosing instructions: Inject 36 units SUBQ every night   The patient has been instructed regarding the correct time, dose, and frequency of taking this medication, including desired effects and most common side effects.   Samples signed for by Dr. Flossie Dibble, Pharm.D.  [name] 4:02 PM 02/14/2016

## 2016-02-15 ENCOUNTER — Other Ambulatory Visit: Payer: Self-pay | Admitting: Oncology

## 2016-02-15 ENCOUNTER — Other Ambulatory Visit: Payer: Self-pay | Admitting: Internal Medicine

## 2016-02-15 DIAGNOSIS — C50311 Malignant neoplasm of lower-inner quadrant of right female breast: Secondary | ICD-10-CM

## 2016-02-16 ENCOUNTER — Telehealth: Payer: Self-pay | Admitting: Internal Medicine

## 2016-02-16 ENCOUNTER — Encounter (INDEPENDENT_AMBULATORY_CARE_PROVIDER_SITE_OTHER): Payer: Self-pay

## 2016-02-16 ENCOUNTER — Encounter: Payer: Medicare Other | Admitting: Internal Medicine

## 2016-02-16 ENCOUNTER — Ambulatory Visit (INDEPENDENT_AMBULATORY_CARE_PROVIDER_SITE_OTHER): Payer: Medicare Other | Admitting: Internal Medicine

## 2016-02-16 VITALS — BP 153/62 | HR 82 | Temp 98.0°F | Ht 65.0 in | Wt 189.1 lb

## 2016-02-16 DIAGNOSIS — M10071 Idiopathic gout, right ankle and foot: Secondary | ICD-10-CM

## 2016-02-16 DIAGNOSIS — M1A3710 Chronic gout due to renal impairment, right ankle and foot, without tophus (tophi): Secondary | ICD-10-CM

## 2016-02-16 NOTE — Telephone Encounter (Signed)
APT. REMINDER CALL, LMTCB °

## 2016-02-16 NOTE — Progress Notes (Signed)
   CC: gout flair   HPI: [name] is a 70 y.o. with past medical history as outlined below who presents to clinic for follow up of gout flair. She was recently seen in clinic for management of gout flair. She had been started colchicine at that time but was then found to have elevated Crt 2.16 and colchicine was discontinued. A prescription for prednisone 10 mg daily for 6 days was started 11/26 and a prescription for uloric was called into a mail away pharmacy. The uloric prescription is expected to arrive tomorrow on the day when she will complete her prednisone prescription. Denies chest pain, shortness of breath, abdominal pain, palpitations.   Please see problem list for status of the pt's chronic medical problems.  Past Medical History:  Diagnosis Date  . Abdominal discomfort    Chronic N/V/D. Presumptive dx Crohn's dx per elevated p ANCA. Failed Entocort and Pentasa. Sep 2003 - ileocolectomy c anastomosis per Dr Deon Pilling 2/2 adhesions - path was hegative for Crohns. EGD, Sm bowel follow through (11/03), and an eteroclysis (10/03) were unrevealing. Cuases hypomag and hypocalcemia.  . Adnexal mass 8/03   s/p lap BSO (R ovarian fibroma) & lysis of adhesions  . Allergy    Seasonal  . Anemia    Multifactorial. Baseline HgB 10-11 ish. B12 def - 150 in 3/10. Fe Def - ferritin 35 3/10. Both are being repleted.  . Breast cancer ([facility]) 03/16/13   right, 5 o'clock  . CAD (coronary artery disease) 1996   1996 - PTCA and angioplasty diagonal branch. 2000 - Rotoblator & angiopllasty of diagonal. 2006 - subendocardial AMI, DES to proximal LAD.Marland Kitchen Also had 90% stenosis in distal apical LAD. EF 55 with apical hypokinesis. Indefinite ASA and Plavix.  . Chronic kidney disease    Chronic renal insuff baseline Cr 1.2 - 1.4 ish.  . Chronic pain    CT 10/10 = Spinal stenosis L2 - S1.  . Diabetes mellitus    Insulin dependent  . Hx of radiation therapy 06/02/13- 07/16/13   right rbeast 4500 cGy 25  sessions, right breast boost 1600 cGy in 8 sessions  . Hyperlipidemia    Managed with both a statin and Welchol. Welchol stopped 2014 2/2 cost and started on fenofibrate   . Hypertension    2006 B renal arteries patent. 2003 MRA - no RAS. 2003 pheo W/U Dr Hassell Done reportedly negative.  . Lupus   . RBBB   . Stroke [facility])    Incidental finding MRI 2002 L lacunar infarct  . Wears dentures    top    Review of Systems:  Please see each problem below for a pertinent review of systems. ROS  Physical Exam:  Vitals:   02/16/16 1602  BP: (!) 153/62  Pulse: 82  Temp: 98 F (36.7 C)  TempSrc: Oral  SpO2: 100%  Weight: 189 lb 1.6 oz (85.8 kg)  Height: 5' 5"  (1.651 m)   Physical Exam  Cardiovascular: Normal rate and regular rhythm.   No murmur heard. Pulmonary/Chest: Effort normal. She has no wheezes. She has no rales.  Abdominal: Soft. She exhibits no distension. There is no tenderness.  Musculoskeletal:  No swelling or tenderness to palpation of right foot    Assessment & Plan:   See Encounters Tab for problem based charting.  Patient seen with Dr. Angelia Mould

## 2016-02-16 NOTE — Assessment & Plan Note (Addendum)
She was recently seen in clinic for management of gout flair. She had been started colchicine at that time but was then found to have elevated Crt 2.16 and colchicine was discontinued. A prescription for prednisone 10 mg daily for 6 days was started 11/26 and a prescription for uloric was called into a mail away pharmacy. Her fasting sugars have remained well controlled in the 90s during this prednisone course. The uloric prescription is expected to arrive tomorrow on the day when she will complete her prednisone prescription. On last CMET liver transaminases were within normal limits.  She feels as though the gout flair is mostly resolved, no tenderness or swelling over the toe today with mild warmth on examination.   Plan is to start taking uloric tomorrow for maintenance treatment of her gout Follow up appointment scheduled in January

## 2016-02-16 NOTE — Patient Instructions (Addendum)
It was a pleasure to meet you today Ms. Berhow!   Call our clinic if you have any problems with the gout   You have an appointment scheduled with Dr. Lynnae January January 18th

## 2016-02-19 NOTE — Progress Notes (Signed)
Internal Medicine Clinic Attending  I saw and evaluated the patient.  I personally confirmed the key portions of the history and exam documented by Dr. Blum and I reviewed pertinent patient test results.  The assessment, diagnosis, and plan were formulated together and I agree with the documentation in the resident's note. 

## 2016-02-23 ENCOUNTER — Ambulatory Visit
Admission: RE | Admit: 2016-02-23 | Discharge: 2016-02-23 | Disposition: A | Discharge status: Home / Self Care | Payer: Medicare Other | Source: Ambulatory Visit | Attending: Oncology | Admitting: Oncology

## 2016-02-23 ENCOUNTER — Encounter: Payer: Self-pay | Admitting: Internal Medicine

## 2016-02-23 DIAGNOSIS — C50311 Malignant neoplasm of lower-inner quadrant of right female breast: Secondary | ICD-10-CM

## 2016-02-23 DIAGNOSIS — R928 Other abnormal and inconclusive findings on diagnostic imaging of breast: Secondary | ICD-10-CM | POA: Diagnosis not present

## 2016-02-23 DIAGNOSIS — Z13820 Encounter for screening for osteoporosis: Secondary | ICD-10-CM | POA: Diagnosis not present

## 2016-02-23 DIAGNOSIS — Z780 Asymptomatic menopausal state: Secondary | ICD-10-CM | POA: Diagnosis not present

## 2016-02-23 DIAGNOSIS — E2839 Other primary ovarian failure: Secondary | ICD-10-CM

## 2016-02-28 NOTE — Telephone Encounter (Signed)
Patient was contacted with Frank Tillman, PharmD candidate. I agree with the assessment and plan of care documented.  

## 2016-02-29 ENCOUNTER — Telehealth: Payer: Self-pay

## 2016-02-29 NOTE — Telephone Encounter (Signed)
-----   Message from Gordy Levan, MD sent at 02/29/2016  8:59 AM EST ----- Labs seen and need follow up: please let her know bone density is normal

## 2016-02-29 NOTE — Telephone Encounter (Signed)
lvm that bone density is normal

## 2016-03-14 ENCOUNTER — Other Ambulatory Visit: Payer: Self-pay | Admitting: Oncology

## 2016-03-14 ENCOUNTER — Other Ambulatory Visit: Payer: Self-pay | Admitting: Internal Medicine

## 2016-03-14 DIAGNOSIS — D509 Iron deficiency anemia, unspecified: Secondary | ICD-10-CM

## 2016-03-15 ENCOUNTER — Telehealth: Payer: Self-pay

## 2016-03-15 DIAGNOSIS — D649 Anemia, unspecified: Secondary | ICD-10-CM

## 2016-03-15 MED ORDER — FERROUS FUMARATE 325 (106 FE) MG PO TABS: 1.0000 | ORAL_TABLET | Freq: Every day | ORAL | 0 refills | Status: DC

## 2016-03-15 NOTE — Telephone Encounter (Signed)
-----   Message from Gordy Levan, MD sent at 03/14/2016  1:52 PM EST ----- Regarding: RE: iron management and rescheduling Her PCP Dr Lynnae January would be fine to manage oral iron. OK to refill until her next appointment there.  She is scheduled back to me in April, last saw K.Curcio 12-12-15. Please let her know that bone density scan was normal and mammograms were fine 02-23-16.  Please let patient know that I will not be working after end of Jan. I would like to ask one of my partners specializing in breast medical oncology to follow her, needs appointment late March or early April for 6 mo follow up on the arimidex. Please send scheduling message to move her to either Dr Burr Medico or Dr Lindi Adie with lab then, whichever she prefers. If she needs anything prior to end of Jan, I am glad to help. Thank you ----- Message ----- From: Janace Hoard, RN Sent: 03/14/2016   1:00 PM To: Lennis Marion Downer, MD Subject: iron management                                We are seeing this pt every 6 months for breast cancer. She is seeing internal medicine. Do we want to continue ordering her iron or have IM take over?

## 2016-03-15 NOTE — Telephone Encounter (Signed)
S/w pt per Dr Mariana Kaufman attached message. Dr [name] is retiring end of January.  Pt chose Dr Burr Medico for her medical oncologist.   Pt has appt with Dr Lynnae January on Jan 18.  1 month refill iron sent. Dr Lynnae January requested to manage iron supplementation going forward.  This message sent to Dr Burr Medico and Dr Lynnae January for their notification.

## 2016-04-03 ENCOUNTER — Telehealth: Payer: Self-pay | Admitting: Internal Medicine

## 2016-04-03 ENCOUNTER — Other Ambulatory Visit: Payer: Self-pay | Admitting: Internal Medicine

## 2016-04-03 MED ORDER — PREDNISONE 10 MG PO TABS: 10.0000 mg | ORAL_TABLET | Freq: Every day | ORAL | 0 refills | Status: DC

## 2016-04-03 NOTE — Progress Notes (Unsigned)
Gout flared last week. Had left over prednisone 10 and took one half pill with resolution of pian. When stopped prednisone, pain returned.   Did not start uloric bc insurance required allopurinol first. Not taking NSAID or colchicine 2/2 renal fxn.

## 2016-04-03 NOTE — Telephone Encounter (Signed)
Doris told me Ms Channing requested med for gout flare. Placed call to get more info. Message left for pt to call me.

## 2016-04-03 NOTE — Telephone Encounter (Signed)
Pt returned call. See order only note

## 2016-04-05 ENCOUNTER — Encounter: Payer: Medicare Other | Admitting: Internal Medicine

## 2016-04-06 ENCOUNTER — Other Ambulatory Visit: Payer: Self-pay | Admitting: Internal Medicine

## 2016-04-06 ENCOUNTER — Telehealth: Payer: Self-pay | Admitting: Dietician

## 2016-04-06 DIAGNOSIS — M1A3710 Chronic gout due to renal impairment, right ankle and foot, without tophus (tophi): Secondary | ICD-10-CM

## 2016-04-06 MED ORDER — ALLOPURINOL 100 MG PO TABS: 50.0000 mg | ORAL_TABLET | Freq: Every day | ORAL | 2 refills | Status: DC

## 2016-04-06 MED ORDER — COLCHICINE 0.6 MG PO TABS: 0.6000 mg | ORAL_TABLET | Freq: Every day | ORAL | 1 refills | Status: DC

## 2016-04-06 NOTE — Telephone Encounter (Signed)
04/06/16 call: left two messages for patient to reutnr call about blood sugars while on steroids.  04/10/2016 call: busy on mobile phone. Left message on home phone.   Steroid-Induced Hyperglycemia Prevention and Management [name] is a 71 y.o. female who meets criteria for [facility] quality improvement program (diabetes patient prescribed short course of steroids).  A/P Current Regimen  Patient prescribed prednisone 10 mg daily x 30 days, currently on day 3 of therapy.  Prednisone indication: gout flare  Current DM regimen repaglinide 2 mg three times a day and levemir  Home BG Monitoring  Patient does have a meter at home and does check BG at home. Meter was not supplied.  CBGs at home unknown   A1C prior to steroid course 7.7%  S/Sx of hyper-unknown  Medication Management  Switch prednisone dose to AM not applicable  Additional treatment for BG control unknown at this time  Increase Repaglinide or  Increase basal insulin undetermined at this point since CBGs are unknown  Physician preference level per protocol: 1  Medication supply"Patient will use own medication"    Follow-up daily by phone  2 weeks protocol or as needed per Dr. Louie Casa, [name] 10:33 AM 04/06/2016

## 2016-04-06 NOTE — Assessment & Plan Note (Addendum)
GFR 24 - 45 ish. Uric acid elevated 9.3 - 11.3. Currently on prednisone for gout flare. Will start allopurinol 50 and colchicine 0.6 both daily. BMP and uric acid 1-2 weeks. Then increase allopurinol in 50 mg increments every month until uric acid at goal < 6.   Called pt - got answering machine. Left message

## 2016-04-09 ENCOUNTER — Telehealth: Payer: Self-pay | Admitting: Pharmacist

## 2016-04-16 NOTE — Telephone Encounter (Signed)
Contacted patient on behalf of geriatrics task force to see if any help needed with medications. Unable to reach

## 2016-04-19 ENCOUNTER — Telehealth: Payer: Self-pay | Admitting: Hematology

## 2016-04-19 NOTE — Telephone Encounter (Signed)
spoke to patient to advise patient of Dr. Mariana Kaufman left message to advise patient of Dr. Mariana Kaufman spoke to patient to advise of Dr. Mariana Kaufman retirement and to confirm next appointment on 06/21/16 at 12:30 am for labs and 1:00 am with Dr. Burr Medico

## 2016-04-22 ENCOUNTER — Other Ambulatory Visit: Payer: Self-pay | Admitting: Internal Medicine

## 2016-05-02 ENCOUNTER — Encounter: Payer: Self-pay | Admitting: Internal Medicine

## 2016-05-02 DIAGNOSIS — N2581 Secondary hyperparathyroidism of renal origin: Secondary | ICD-10-CM | POA: Insufficient documentation

## 2016-05-03 ENCOUNTER — Encounter: Payer: Self-pay | Admitting: Internal Medicine

## 2016-05-03 ENCOUNTER — Encounter: Payer: Medicare Other | Admitting: Internal Medicine

## 2016-05-08 DIAGNOSIS — C50911 Malignant neoplasm of unspecified site of right female breast: Secondary | ICD-10-CM | POA: Diagnosis not present

## 2016-05-08 NOTE — Progress Notes (Signed)
Patient ID: AVIGAYIL TON, female   DOB: 17-Mar-1946, 71 y.o.   MRN: [id number]   Ming is seen today for F/U of CAD with pevious rotoblator to the LAD in 2000. Her last myovue on 11/2006 showed mild apical thinning and was low risk. She has not had any SSCP and has been compliant with her meds.Her BP is alwyas hard to Rx and she is on multiple drugs with persistant mild tachycardia. She denies dyspnea, PND orthopnea. Has some swelling in her feet. Diet still an issue with her DM.   Seeing Dr Valere Dross for XRT breast CA doing well   Going to [place] with family in April  Exercising at [employer] 3x/ week  ROS: Denies fever, malais, weight loss, blurry vision, decreased visual acuity, cough, sputum, SOB, hemoptysis, pleuritic pain, palpitaitons, heartburn, abdominal pain, melena, lower extremity edema, claudication, or rash.  All other systems reviewed and negative  General: Affect appropriate Healthy:  appears stated age 7: normal Neck supple with no adenopathy JVP normal no bruits no thyromegaly Lungs clear with no wheezing and good diaphragmatic motion Heart:  S1/S2 2/6 SEM murmur, no rub, gallop or click PMI normal Abdomen: benighn, BS positve, no tenderness, no AAA no bruit.  No HSM or HJR Distal pulses intact with no bruits No edema Neuro non-focal Skin warm and dry No muscular weakness   Current Outpatient Prescriptions  Medication Sig Dispense Refill  . allopurinol (ZYLOPRIM) 100 MG tablet Take 0.5 tablets (50 mg total) by mouth daily. 45 tablet 2  . amLODipine (NORVASC) 10 MG tablet Take 1 tablet by mouth  daily 90 tablet 3  . anastrozole (ARIMIDEX) 1 MG tablet TAKE 1 TABLET BY MOUTH  DAILY 90 tablet 1  . aspirin EC 81 MG tablet Take 81 mg by mouth daily.    Marland Kitchen atorvastatin (LIPITOR) 20 MG tablet Take 1 tablet by mouth at  bedtime 90 tablet 3  . carvedilol (COREG) 6.25 MG tablet Take 6.25 mg by mouth 2 (two) times daily.    . clopidogrel (PLAVIX) 75 MG tablet TAKE 1 TABLET BY  MOUTH ONCE A DAY 90 tablet 3  . colchicine 0.6 MG tablet Take 1 tablet (0.6 mg total) by mouth daily. 90 tablet 1  . cyclobenzaprine (FLEXERIL) 10 MG tablet Take 10 mg by mouth daily as needed for muscle spasms.    . cyclobenzaprine (FLEXERIL) 5 MG tablet     . desloratadine (CLARINEX) 5 MG tablet Take 5 mg by mouth at bedtime as needed (allergies). Reported on 04/11/2015    . fenofibrate 54 MG tablet     . ferrous fumarate (HEMOCYTE - 106 MG FE) 325 (106 Fe) MG TABS tablet Take 1 tablet (106 mg of iron total) by mouth daily. 30 each 0  . furosemide (LASIX) 20 MG tablet TAKE 1 TABLET BY MOUTH TWO  TIMES DAILY 180 tablet 3  . gabapentin (NEURONTIN) 300 MG capsule Take 1 capsule (300 mg total) by mouth 2 (two) times daily. 60 capsule 3  . hydrOXYzine (VISTARIL) 50 MG capsule TAKE ONE CAPSULE BY MOUTH ONCE DAILY AT BEDTIME AS NEEDED 30 capsule 2  . LEVEMIR 100 UNIT/ML injection INJECT 36 UNITS SUBCUTANEOUSLY AT BEDTIME 30 mL 3  . loperamide (IMODIUM) 2 MG capsule TAKE ONE CAPSULE BY MOUTH ONCE DAILY AS NEEDED FOR DIARRHEA OR LOOSE STOOLS 30 capsule 2  . losartan (COZAAR) 25 MG tablet Take 1 tablet (25 mg total) by mouth daily. 90 tablet 5  . nitroGLYCERIN (NITROSTAT) 0.4 MG SL  tablet Place 1 tablet (0.4 mg total) under the tongue every 5 (five) minutes as needed for chest pain. 25 tablet 3  . ONE TOUCH ULTRA TEST test strip USE TO CHECK BLOOD SUGAR 3  TO 4 TIMES DAILY 400 each 0  . potassium chloride (K-DUR) 10 MEQ tablet Take 2 tablets (20 mEq total) by mouth daily. 180 tablet 3  . predniSONE (DELTASONE) 10 MG tablet Take 1 tablet (10 mg total) by mouth daily with breakfast. For acute gout flare 30 tablet 0  . repaglinide (PRANDIN) 2 MG tablet TAKE 1 TABLET BY MOUTH 3  TIMES A DAY BEFORE MEALS 270 tablet 3   No current facility-administered medications for this visit.    Facility-Administered Medications Ordered in Other Visits  Medication Dose Route Frequency Provider Last Rate Last Dose  . 0.9  %  sodium chloride infusion   Intravenous Continuous Burgess Estelle, MD 150 mL/hr at 10/03/15 1607      Allergies  Percocet [oxycodone-acetaminophen]; Diazepam; Haloperidol lactate; Lorazepam; Morphine sulfate; Propoxyphene hcl; and Tramadol hcl  Electrocardiogram:  2916 SR RBBB normal ST segments  05/18/16  SR rate 85 LAD RBBB   Assessment and Plan  CAD: Stable with no angina and good activity level.  Continue medical Rx HTN: Well controlled.  Continue current medications and low sodium Dash type diet.   Chol:  Cholesterol is at goal.  Continue current dose of statin and diet Rx.  No myalgias or side effects.  F/U  LFT's in 6 months. Lab Results  Component Value Date   LDLCALC 88 07/21/2015            DM: Discussed low carb diet.  Target hemoglobin A1c is 6.5 or less.  Continue current medications. Breast Cancer f/u oncology post XRT RBBB ECG stable no change  Neuropathy stable continue neurontin    [name]

## 2016-05-09 ENCOUNTER — Encounter: Payer: Self-pay | Admitting: Cardiovascular Disease

## 2016-05-11 DIAGNOSIS — C50911 Malignant neoplasm of unspecified site of right female breast: Secondary | ICD-10-CM | POA: Diagnosis not present

## 2016-05-18 ENCOUNTER — Ambulatory Visit (INDEPENDENT_AMBULATORY_CARE_PROVIDER_SITE_OTHER): Payer: Medicare Other | Admitting: Cardiovascular Disease

## 2016-05-18 ENCOUNTER — Encounter: Payer: Self-pay | Admitting: Cardiovascular Disease

## 2016-05-18 VITALS — BP 135/60 | HR 83 | Ht 65.0 in | Wt 191.8 lb

## 2016-05-18 DIAGNOSIS — I2510 Atherosclerotic heart disease of native coronary artery without angina pectoris: Secondary | ICD-10-CM | POA: Diagnosis not present

## 2016-05-18 DIAGNOSIS — Z7689 Persons encountering health services in other specified circumstances: Secondary | ICD-10-CM

## 2016-05-18 NOTE — Patient Instructions (Signed)

## 2016-05-21 ENCOUNTER — Other Ambulatory Visit: Payer: Self-pay

## 2016-05-21 DIAGNOSIS — C50911 Malignant neoplasm of unspecified site of right female breast: Secondary | ICD-10-CM | POA: Diagnosis not present

## 2016-05-22 MED ORDER — HYDROXYZINE PAMOATE 50 MG PO CAPS: ORAL_CAPSULE | ORAL | 1 refills | Status: DC

## 2016-05-24 ENCOUNTER — Ambulatory Visit (INDEPENDENT_AMBULATORY_CARE_PROVIDER_SITE_OTHER): Payer: Medicare Other | Admitting: Internal Medicine

## 2016-05-24 ENCOUNTER — Encounter: Payer: Self-pay | Admitting: Internal Medicine

## 2016-05-24 VITALS — BP 147/62 | HR 77 | Temp 97.9°F | Ht 65.0 in | Wt 193.0 lb

## 2016-05-24 DIAGNOSIS — D631 Anemia in chronic kidney disease: Secondary | ICD-10-CM | POA: Diagnosis not present

## 2016-05-24 DIAGNOSIS — I10 Essential (primary) hypertension: Secondary | ICD-10-CM

## 2016-05-24 DIAGNOSIS — E1142 Type 2 diabetes mellitus with diabetic polyneuropathy: Secondary | ICD-10-CM

## 2016-05-24 DIAGNOSIS — M1A30X0 Chronic gout due to renal impairment, unspecified site, without tophus (tophi): Secondary | ICD-10-CM

## 2016-05-24 DIAGNOSIS — E1165 Type 2 diabetes mellitus with hyperglycemia: Secondary | ICD-10-CM | POA: Diagnosis not present

## 2016-05-24 DIAGNOSIS — E1122 Type 2 diabetes mellitus with diabetic chronic kidney disease: Secondary | ICD-10-CM

## 2016-05-24 DIAGNOSIS — I2510 Atherosclerotic heart disease of native coronary artery without angina pectoris: Secondary | ICD-10-CM

## 2016-05-24 DIAGNOSIS — N183 Chronic kidney disease, stage 3 unspecified: Secondary | ICD-10-CM

## 2016-05-24 DIAGNOSIS — Z794 Long term (current) use of insulin: Secondary | ICD-10-CM

## 2016-05-24 DIAGNOSIS — I129 Hypertensive chronic kidney disease with stage 1 through stage 4 chronic kidney disease, or unspecified chronic kidney disease: Secondary | ICD-10-CM | POA: Diagnosis not present

## 2016-05-24 DIAGNOSIS — M1A3710 Chronic gout due to renal impairment, right ankle and foot, without tophus (tophi): Secondary | ICD-10-CM

## 2016-05-24 DIAGNOSIS — Z79899 Other long term (current) drug therapy: Secondary | ICD-10-CM | POA: Diagnosis not present

## 2016-05-24 DIAGNOSIS — Z7982 Long term (current) use of aspirin: Secondary | ICD-10-CM | POA: Diagnosis not present

## 2016-05-24 DIAGNOSIS — R234 Changes in skin texture: Secondary | ICD-10-CM | POA: Diagnosis not present

## 2016-05-24 DIAGNOSIS — IMO0002 Reserved for concepts with insufficient information to code with codable children: Secondary | ICD-10-CM

## 2016-05-24 DIAGNOSIS — N184 Chronic kidney disease, stage 4 (severe): Secondary | ICD-10-CM

## 2016-05-24 LAB — GLUCOSE, CAPILLARY: Glucose-Capillary: 177 mg/dL — ABNORMAL HIGH (ref 65–99)

## 2016-05-24 LAB — POCT GLYCOSYLATED HEMOGLOBIN (HGB A1C): Hemoglobin A1C: 8.2

## 2016-05-24 NOTE — Progress Notes (Signed)
   Subjective:    Patient ID: [name], female    DOB: 1945/10/15, 70 y.o.   MRN: [id number]  HPI  [name] is here for DM F/U. Please see the A&P for the status of the pt's chronic medical problems.  ROS : per ROS section and in problem oriented charting. All other systems are negative.  PMHx, Soc hx, and / or Fam hx : Still doing Zumba. To go on [place] cruise with daughters in April. Eats cereal for breakfast, fruit during day, dinner at night. Doesn't understand why weight going up.  Review of Systems  Constitutional: Positive for unexpected weight change. Negative for activity change and appetite change.  HENT:       Dripping in ear L  Endocrine:       Feels some hypoglycemia sxs  Musculoskeletal: Positive for arthralgias.  Skin:       Painful crack       Objective:   Physical Exam  Constitutional: She appears well-developed and well-nourished. No distress.  HENT:  Head: Normocephalic and atraumatic.  Right Ear: External ear normal.  Left Ear: External ear normal.  Nose: Nose normal.  Mouth/Throat: No oropharyngeal exudate.  There is slight wax in the inferior distal ear canal of the left ear. No erythema, no tenderness  Eyes: Conjunctivae and EOM are normal. Right eye exhibits no discharge. Left eye exhibits no discharge. No scleral icterus.  Skin: Skin is dry. No rash noted. She is not diaphoretic. No erythema.  R thumb with dry scaling skin. Crack 2 mm x 1 mm at distal nail margin. No erythema, no drainage.  Psychiatric: She has a normal mood and affect. Her behavior is normal. Judgment and thought content normal.          Assessment & Plan:

## 2016-05-24 NOTE — Assessment & Plan Note (Signed)
This problem is chronic and unchanged. I had started her on allopurinol 50 and colchicine daily. I had asked her to come back in 1-2 weeks to get a uric acid level but she did not. She states her nephrologist stopped her colchicine but continued her allopurinol which is concerning because it was a new medication. She has prednisone 10 mg at home to take as needed for gout flares. She states that she only takes one half pill at a time and only needs 1-2 doses per week. It is not clear whether these pains are gout related although she states the most recent one occurred when she woke up, it throbbed all day, and got better with a half a dose of prednisone. I am going to continue her allopurinol and nephrology notes to see why the colchicine was stopped.  PLAN : cont to follow

## 2016-05-24 NOTE — Patient Instructions (Addendum)
1. See me & [name] Friday at [facility] CGM download #1

## 2016-05-24 NOTE — Assessment & Plan Note (Signed)
This problem is chronic and worsening. Her A1c trend is 7.4 - 7.7 - 8.2. She uses Levemir 36 units and prandin 1 pill after meals. She occasionally feel hypoglycemic. She does not understand how her sugar and weight are worsening because she does not eat much and is exercising regularly area she states that she eats cereal for breakfast, free throughout the day, and a regular supper. As we talked about her gout she stated that she likes to eat seafood and liver. She checks her CBG about once a day. She had one hypoglycemic episode at 69. 21% are within target but there is a lot of variability throughout the day with morning sugars ranging from 90-300. Midday range from 150-390. Evening ranged from 80 to the high 300s. We discussed options and decided to place a continuous glucose monitor on her and have her return in one week. At that time she is willing to consider an add on therapy and I would encourage her to consider a non-insulin therapy. I'm wondering whether her diet is applying into her variability significantly and I have the CGM will determine this. She will also be meeting with [name] in 1 week.  PLAN : CGM RTC 1 week for download. Appt with Lynnae January and [name]

## 2016-05-24 NOTE — Assessment & Plan Note (Signed)
This problem is chronic and worsening. She has established with nephrology. I am checking her creatinine today. She is on an ARB but needs better hypertension and diabetic control. I am going to obtain nephrology's notes before making any changes. Her microalbumin level is between the 30s and 50s so not significantly proteinuric.  PLAN : Continue ARB Get nephrology's notes

## 2016-05-24 NOTE — Assessment & Plan Note (Signed)
This problem is chronic and stable. Her baseline hemoglobin is around 10. She recently had it checked in September 2012 and was at baseline. She takes iron supplementation daily and her most recent ferritin was 77 in September 2017.  PLAN:  Cont current meds

## 2016-05-24 NOTE — Assessment & Plan Note (Signed)
This problem is chronic and stable. Her regimen has been changed recently and can currently is losartan 25 mg once a day, amlodipine 10 mg once a day, along with Coreg 6.25 twice a day. Her blood pressure is not at goal today. I would like to obtain nephrology's notes before titrating up her ARB. She will come back in one week and recheck it then.  PLAN:  Cont current meds Get nephrology return to clinic 1 week

## 2016-05-24 NOTE — Progress Notes (Signed)
Freestyle Libre Pro CGM sensor placed and started. Patient was educated about wearing sensor, keeping food, activity and medication log and when to call office. Follow up was arranged with the patient.   

## 2016-05-24 NOTE — Assessment & Plan Note (Addendum)
This problem is chronic and stable. She recently saw Dr. Johnsie Cancel. She has no chest pain. She is exercising regularly but is frustrated that her weight is going up. She is on a beta blocker, aspirin, and a statin. Her statin dose is a moderate intensity. Her cardiologist recommended a low carb with sodium diet and better diabetic control.  PLAN:  Cont current meds

## 2016-05-25 ENCOUNTER — Encounter: Payer: Self-pay | Admitting: Internal Medicine

## 2016-05-25 ENCOUNTER — Telehealth: Payer: Self-pay | Admitting: Dietician

## 2016-05-25 LAB — BMP8+ANION GAP
Anion Gap: 19 mmol/L — ABNORMAL HIGH (ref 10.0–18.0)
BUN / CREAT RATIO: 20 (ref 12–28)
BUN: 36 mg/dL — ABNORMAL HIGH (ref 8–27)
CO2: 23 mmol/L (ref 18–29)
Calcium: 9.9 mg/dL (ref 8.7–10.3)
Chloride: 98 mmol/L (ref 96–106)
Creatinine, Ser: 1.79 mg/dL — ABNORMAL HIGH (ref 0.57–1.00)
GFR calc Af Amer: 33 mL/min/{1.73_m2} — ABNORMAL LOW (ref >59)
GFR calc non Af Amer: 29 mL/min/{1.73_m2} — ABNORMAL LOW (ref >59)
Glucose: 147 mg/dL — ABNORMAL HIGH (ref 65–99)
Potassium: 4.2 mmol/L (ref 3.5–5.2)
SODIUM: 140 mmol/L (ref 134–144)

## 2016-05-25 LAB — URIC ACID: URIC ACID: 5.4 mg/dL (ref 2.5–7.1)

## 2016-05-25 NOTE — Telephone Encounter (Signed)
Called patient and left a message to let her know that no extra care or cover is needed while wearing the Freestyle LibrePro during her water aerobic is fine. Encouraged her to go and confirmed her appointment with Dr. Lynnae January for next Friday. Will let lab/phamacy know as I will not be here that day to assist with download, reports and food and activity log.

## 2016-05-30 DIAGNOSIS — N6459 Other signs and symptoms in breast: Secondary | ICD-10-CM | POA: Diagnosis not present

## 2016-05-30 DIAGNOSIS — Z853 Personal history of malignant neoplasm of breast: Secondary | ICD-10-CM | POA: Diagnosis not present

## 2016-05-30 DIAGNOSIS — N6489 Other specified disorders of breast: Secondary | ICD-10-CM | POA: Diagnosis not present

## 2016-06-01 ENCOUNTER — Other Ambulatory Visit: Payer: Self-pay | Admitting: General Surgery

## 2016-06-01 ENCOUNTER — Ambulatory Visit (INDEPENDENT_AMBULATORY_CARE_PROVIDER_SITE_OTHER): Payer: Medicare Other | Admitting: Internal Medicine

## 2016-06-01 ENCOUNTER — Encounter: Payer: Self-pay | Admitting: Internal Medicine

## 2016-06-01 DIAGNOSIS — E11649 Type 2 diabetes mellitus with hypoglycemia without coma: Secondary | ICD-10-CM | POA: Diagnosis not present

## 2016-06-01 DIAGNOSIS — E1165 Type 2 diabetes mellitus with hyperglycemia: Secondary | ICD-10-CM

## 2016-06-01 DIAGNOSIS — Z713 Dietary counseling and surveillance: Secondary | ICD-10-CM

## 2016-06-01 DIAGNOSIS — N6459 Other signs and symptoms in breast: Secondary | ICD-10-CM

## 2016-06-01 DIAGNOSIS — E1122 Type 2 diabetes mellitus with diabetic chronic kidney disease: Secondary | ICD-10-CM

## 2016-06-01 DIAGNOSIS — Z794 Long term (current) use of insulin: Secondary | ICD-10-CM

## 2016-06-01 DIAGNOSIS — N183 Chronic kidney disease, stage 3 (moderate): Principal | ICD-10-CM

## 2016-06-01 DIAGNOSIS — IMO0002 Reserved for concepts with insufficient information to code with codable children: Secondary | ICD-10-CM

## 2016-06-01 NOTE — Patient Instructions (Addendum)
1. 26th 10 AM Dr Lynnae January

## 2016-06-01 NOTE — Assessment & Plan Note (Addendum)
A1C 8.2 Levemir 36 Prandin 1 TID Does  Zumba freq Wt down 2 lbs since last appt  [place] wore the CGM for 9 days. The average reading was 153, % time in target was 69, % time below target was 4, and % time above target was. 27. Intervention will be to see below. The patient will be scheduled to see Dr Lynnae January for a final appointment.  1. Hypoglycemia - btw 3 and 6 AM 2 of the 9 days. The first was w/in the first 24 hrs so less accurate, the second was predictable with falling evening CBG's. Intervention = small snack at bedtime if pre-bed CBG < 100. 2. Hyperglycemia freq btw 6 and 9 AM. She drinks a guava fruit juice mix in AM that I suspect is carb heavy. Intervention = stop juice 3. Sig variability btw noon and 3 PM. She is uncertain of cause. Snacks on fruit instead of organized meal. Does not exercise at this time. Will pay attention to what she eats.

## 2016-06-01 NOTE — Progress Notes (Signed)
[name] is here for CGM F/U.

## 2016-06-11 ENCOUNTER — Encounter: Payer: Self-pay | Admitting: Dietician

## 2016-06-11 ENCOUNTER — Ambulatory Visit (INDEPENDENT_AMBULATORY_CARE_PROVIDER_SITE_OTHER): Payer: Medicare Other | Admitting: *Deleted

## 2016-06-11 ENCOUNTER — Encounter: Payer: Medicare Other | Admitting: Internal Medicine

## 2016-06-11 ENCOUNTER — Ambulatory Visit (INDEPENDENT_AMBULATORY_CARE_PROVIDER_SITE_OTHER): Payer: Medicare Other | Admitting: Dietician

## 2016-06-11 ENCOUNTER — Other Ambulatory Visit: Payer: Self-pay | Admitting: Dietician

## 2016-06-11 ENCOUNTER — Encounter: Payer: Self-pay | Admitting: Internal Medicine

## 2016-06-11 ENCOUNTER — Telehealth: Payer: Self-pay | Admitting: *Deleted

## 2016-06-11 DIAGNOSIS — E1122 Type 2 diabetes mellitus with diabetic chronic kidney disease: Secondary | ICD-10-CM

## 2016-06-11 DIAGNOSIS — N183 Chronic kidney disease, stage 3 unspecified: Secondary | ICD-10-CM

## 2016-06-11 DIAGNOSIS — Z713 Dietary counseling and surveillance: Secondary | ICD-10-CM | POA: Diagnosis not present

## 2016-06-11 DIAGNOSIS — IMO0002 Reserved for concepts with insufficient information to code with codable children: Secondary | ICD-10-CM

## 2016-06-11 DIAGNOSIS — Z794 Long term (current) use of insulin: Secondary | ICD-10-CM | POA: Diagnosis not present

## 2016-06-11 DIAGNOSIS — E1165 Type 2 diabetes mellitus with hyperglycemia: Secondary | ICD-10-CM | POA: Diagnosis not present

## 2016-06-11 DIAGNOSIS — Z6831 Body mass index (BMI) 31.0-31.9, adult: Secondary | ICD-10-CM | POA: Diagnosis not present

## 2016-06-11 LAB — GLUCOSE, CAPILLARY: GLUCOSE-CAPILLARY: 143 mg/dL — AB (ref 65–99)

## 2016-06-11 MED ORDER — CYANOCOBALAMIN 1000 MCG/ML IJ SOLN
1000.0000 ug | Freq: Once | INTRAMUSCULAR | Status: AC
  Administered 2016-06-11: 1000 ug via INTRAMUSCULAR

## 2016-06-11 MED ORDER — SCOPOLAMINE 1 MG/3DAYS TD PT72: 1.0000 | MEDICATED_PATCH | TRANSDERMAL | 0 refills | Status: DC

## 2016-06-11 MED ORDER — CYANOCOBALAMIN 1000 MCG/ML IJ SOLN: 1000.0000 ug | Freq: Once | INTRAMUSCULAR | 0 refills | Status: DC

## 2016-06-11 NOTE — Telephone Encounter (Signed)
Motion sickness on cruise

## 2016-06-11 NOTE — Addendum Note (Signed)
Addended by: Larey Dresser A on: 06/11/2016 02:00 PM   Modules accepted: Orders

## 2016-06-11 NOTE — Progress Notes (Signed)
  Medical Nutrition Therapy:  Appt start time: 1030 end time:  1120. Visit # 1  Assessment:  Primary concerns today: glycemic control [name] is here for follow up after wearing a CGM for 9 days. At her 7 day intervention, she was asked to stop drinking her guava juice with breakfast to decrease her morning blood sugar spikes. She did this but was not sure if it had helped. Her after breakfast blood sugar today in office was 143 at 2.5 hours after eating chicken biscuit with half the breading. She was also asked to eat a bedtime snack to prevent hypoglycemia, but she said she is unable to do that because she goes to bed soon after her dinner that is late because of her water aerobics schedule from 630-730 Mondays, Wednesday and T hursdays.  Preferred Learning Style: no preference indicated Learning Readiness: Ready and change in progress  ANTHROPOMETRICS: weight-190.2#, BMI-31.65 WEIGHT HISTORY:stables for past few years SLEEP:9-10 to 5 am No problems with chewing swallowing, food intolerance, nausea or constipation reported tpday Dining Out (times/week): not discussed today MEDICATIONS: sugar pill (prandin)  with breakfast and dinner, levemir 36 units after dinner BLOOD SUGAR: CGM and meter download reviewed today with patient DIETARY INTAKE: Usual eating pattern includes 2 meals and 1-2 snacks per day.   24-hr recall:  B ( 6-7 AM): weekdays- chicken or ham and cheese biscuit, weekends- oatmeal, water Snk/Lunch( 1:30 PM PM): pack of cheese crackers and water or lemonade with sugar out of vending machine Snk ( PM): not usually D ( PM): meal and vegetables- likes broccoli, denies eating bread Snk ( PM): does not have time most of the time Beverages: water, guava juice, lemonade  Usual physical activity: water aerobics Monday, Wednesday and Thursday nights from 630 to 8 PM  Progress Towards Goal(s):  In progress.   Nutritional Diagnosis:  [place]-2.3 Food-medication interaction As related  to mismatch of nutrtion& activity with diabetes medication.  As evidenced by her blood sugar fluctuations on both CBG and her home meter.     Intervention:  Nutrition education about how to interpret blood glucose meter graphs and CGM graphs. Taught carb portions using empty container and labels to show portions of juice, foods, . Coordination of care: discussed moving Pm insulin to am with Dr. Lynnae January Teaching Method Utilized: Visual, Auditory, Hands on Handouts given during visit include:AVS, carb portions for diabetes Barriers to learning/adherence to lifestyle change: lack of understanding, competing values Demonstrated degree of understanding via:  Teach Back   Monitoring/Evaluation:  Dietary intake, exercise, meter, and body weight 6 weeks.

## 2016-06-11 NOTE — Patient Instructions (Addendum)
Two things to try to improve your blood sugars:  1- Try to limit guava juice to 1/2 cup when you also eat a half of biscuit or another starch  2- Try taking your Levemir in the morning may help prevent the low blood sugar in the middle of the night

## 2016-06-11 NOTE — Telephone Encounter (Signed)
done

## 2016-06-11 NOTE — Telephone Encounter (Signed)
Patient going on a cruise soon & requesting scopolamine patch. Also wants Vit B12 injection today. She's with [name] @ this time. Thanks!

## 2016-06-11 NOTE — Telephone Encounter (Signed)
Showed patient how to use insulin pens today. She returned demonstration without problems. She would like to use the pens instead of the vials and syringes.

## 2016-06-12 ENCOUNTER — Other Ambulatory Visit: Payer: Self-pay

## 2016-06-12 MED ORDER — INSULIN PEN NEEDLE 31G X 5 MM MISC: 3 refills | Status: DC

## 2016-06-12 MED ORDER — INSULIN DETEMIR 100 UNIT/ML FLEXPEN: PEN_INJECTOR | SUBCUTANEOUS | 11 refills | Status: DC

## 2016-06-12 NOTE — Telephone Encounter (Signed)
[name] from [place] need to speak with the nurse about insulin pen needle. Please call back.

## 2016-06-12 NOTE — Telephone Encounter (Signed)
Pharmacy needed clarification on how many times pt is to use pen needles.  Confirmed with pharmacy-pt to use one time daily to inject insulin.  Phone call complete.Despina Hidden Cassady3/27/20182:51 PM

## 2016-06-14 NOTE — Progress Notes (Signed)
[place]  [phone] [phone]  Clinic Follow up Note   Patient Care Team: Bartholomew Crews, MD as PCP - General Thelma Comp, OD as Consulting Physician (Optometry) Stark Klein, MD as Consulting Physician (General Surgery) Truitt Merle, MD as Consulting Physician (Hematology) 06/21/2016  SUMMARY OF ONCOLOGIC HISTORY: Oncology History   Cancer Staging Breast cancer of lower-inner quadrant of right female breast [facility]) Staging form: Breast, AJCC 7th Edition - Clinical: Stage IA (T1b, N0, cM0) - Unsigned Staging comments: Staged at breast conference 03/25/13.  - Pathologic stage from 05/12/2013: Stage IA (T1b, N0, cM0) - Signed by Truitt Merle, MD on 06/21/2016       Breast cancer of lower-inner quadrant of right female breast ([place])   02/27/2013 Mammogram    Diagnostic mammogram and ultrasound showed a 6 x 5 mm mass at 5:00 position. Axilla was negative by ultrasound.      03/16/2013 Initial Biopsy    Right breast 5:00 position biopsy showed invasive ductal carcinoma, low-grade      03/16/2013 Receptors her2    ER100% positive, PR 100% positive, HER-2 negative, Ki-67 20%      03/18/2013 Initial Diagnosis    Breast cancer of lower-inner quadrant of right female breast ([place])     05/12/2013 Surgery    Right breast lumpectomy with sentinel lymph node biopsy      05/12/2013 Pathology Results    Right breast lumpectomy showed invasive ductal carcinoma, grade 2, 0.9 cm, 6 sentinel lymph nodes were all negative. Margins were negative.      05/12/2013 Oncotype testing    Oncotype recurrence score 19, intermediate risk, 10 year risk of distant recurrence 12% with tamoxifen for 5 years. Patient declined adjuvant chemotherapy.      06/02/2013 - 07/16/2013 Radiation Therapy    Adjuvant breast radiation with boost 1600Gy      08/06/2013 -  Anti-estrogen oral therapy    Arimidex 1 mg daily       INTERVAL HISTORY: The patient reports to  clinic today For follow-up. She was previously under Dr. Mariana Kaufman care, who has recently retired. This is my first encounter with her. She is doing well overall. She experiences soreness with no pain under her right axillary and right lateral breast that is getting worse over time, especially in the past one month. This is tolerable. She has been going to PT which has not helped much. Her energy level is good and she does water aerobics 3x a week.She denies cough or chest pain.   REVIEW OF SYSTEMS:   Constitutional: Denies fevers, chills or abnormal weight loss Eyes: Denies blurriness of vision Ears, nose, mouth, throat, and face: Denies mucositis or sore throat Respiratory: Denies cough, dyspnea or wheezes Cardiovascular: Denies palpitation, chest discomfort or lower extremity swelling Gastrointestinal:  Denies nausea, heartburn or change in bowel habits Skin: Denies abnormal skin rashes Lymphatics: Denies new lymphadenopathy or easy bruising Neurological:Denies numbness, tingling or new weaknesses Behavioral/Psych: Mood is stable, no new changes  All other systems were reviewed with the patient and are negative. Musculoskeletal: (+) right axillary soreness  MEDICAL HISTORY:  Past Medical History:  Diagnosis Date  . Abdominal discomfort    Chronic N/V/D. Presumptive dx Crohn's dx per elevated p ANCA. Failed Entocort and Pentasa. Sep 2003 - ileocolectomy c anastomosis per Dr Deon Pilling 2/2 adhesions - path was hegative for Crohns. EGD, Sm bowel follow through (11/03), and an eteroclysis (10/03) were unrevealing. Cuases hypomag and hypocalcemia.  . Adnexal mass 8/03  s/p lap BSO (R ovarian fibroma) & lysis of adhesions  . Allergy    Seasonal  . Anemia    Multifactorial. Baseline HgB 10-11 ish. B12 def - 150 in 3/10. Fe Def - ferritin 35 3/10. Both are being repleted.  . Breast cancer ([place]) 03/16/13   right, 5 o'clock  . CAD (coronary artery disease) 1996   1996 - PTCA and angioplasty  diagonal branch. 2000 - Rotoblator & angiopllasty of diagonal. 2006 - subendocardial AMI, DES to proximal LAD.Marland Kitchen Also had 90% stenosis in distal apical LAD. EF 55 with apical hypokinesis. Indefinite ASA and Plavix.  . CHF (congestive heart failure) ([place])   . Chronic kidney disease    Chronic renal insuff baseline Cr 1.2 - 1.4 ish.  . Chronic pain    CT 10/10 = Spinal stenosis L2 - S1.  . Diabetes mellitus    Insulin dependent  . Hx of radiation therapy 06/02/13- 07/16/13   right rbeast 4500 cGy 25 sessions, right breast boost 1600 cGy in 8 sessions  . Hyperlipidemia    Managed with both a statin and Welchol. Welchol stopped 2014 2/2 cost and started on fenofibrate   . Hypertension    2006 B renal arteries patent. 2003 MRA - no RAS. 2003 pheo W/U Dr Hassell Done reportedly negative.  . Lupus   . RBBB   . Stroke [facility])    Incidental finding MRI 2002 L lacunar infarct  . Wears dentures    top    SURGICAL HISTORY: Past Surgical History:  Procedure Laterality Date  . ABDOMINAL HYSTERECTOMY    . BILATERAL SALPINGOOPHORECTOMY  8/03   Lap BSO (R ovarian fibroma) and adhesion lysis  . BOWEL RESECTION  2003   ileocolectomy with anastomosis 2/2 adhesions  . BREAST LUMPECTOMY WITH NEEDLE LOCALIZATION AND AXILLARY SENTINEL LYMPH NODE BX Right 04/22/2013   Procedure: BREAST LUMPECTOMY WITH NEEDLE LOCALIZATION AND AXILLARY SENTINEL LYMPH NODE BX;  Surgeon: Stark Klein, MD;  Location: [place];  Service: General;  Laterality: Right;  . CARDIAC CATHETERIZATION     2 stents  . CHOLECYSTECTOMY    . HEMICOLECTOMY     R sided hemicolectomy  . HERNIA REPAIR     Ventral hernia repair  . 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  ALLERGIES:  is allergic to percocet [oxycodone-acetaminophen]; diazepam; haloperidol lactate; lorazepam; morphine sulfate; propoxyphene hcl;  and tramadol hcl.  MEDICATIONS:  Current Outpatient Prescriptions  Medication Sig Dispense Refill  . amLODipine (NORVASC) 10 MG tablet Take 1 tablet by mouth  daily 90 tablet 3  . anastrozole (ARIMIDEX) 1 MG tablet Take 1 tablet (1 mg total) by mouth daily. 90 tablet 1  . aspirin EC 81 MG tablet Take 81 mg by mouth daily.    Marland Kitchen atorvastatin (LIPITOR) 20 MG tablet Take 1 tablet by mouth at  bedtime 90 tablet 3  . carvedilol (COREG) 6.25 MG tablet Take 6.25 mg by mouth 2 (two) times daily.    . clopidogrel (PLAVIX) 75 MG tablet TAKE 1 TABLET BY MOUTH ONCE A DAY 90 tablet 3  . desloratadine (CLARINEX) 5 MG tablet Take 5 mg by mouth at bedtime as needed (allergies). Reported on 04/11/2015    . fenofibrate 54 MG tablet     . ferrous fumarate (HEMOCYTE - 106 MG FE) 325 (106 Fe) MG TABS tablet Take  1 tablet (106 mg of iron total) by mouth daily. 30 each 0  . furosemide (LASIX) 20 MG tablet TAKE 1 TABLET BY MOUTH TWO  TIMES DAILY 180 tablet 3  . gabapentin (NEURONTIN) 300 MG capsule Take 1 capsule (300 mg total) by mouth 2 (two) times daily. 60 capsule 3  . hydrOXYzine (VISTARIL) 50 MG capsule TAKE ONE CAPSULE BY MOUTH ONCE DAILY AT BEDTIME AS NEEDED 90 capsule 1  . Insulin Detemir (LEVEMIR FLEXTOUCH) 100 UNIT/ML Pen Inject 36 UNITS SUBCUTANEOUSLY at the same time each day 15 mL 11  . Insulin Pen Needle (B-D UF III MINI PEN NEEDLES) 31G X 5 MM MISC Use to inject insulin one time a day 100 each 3  . loperamide (IMODIUM) 2 MG capsule TAKE ONE CAPSULE BY MOUTH ONCE DAILY AS NEEDED FOR DIARRHEA OR LOOSE STOOLS 30 capsule 2  . losartan (COZAAR) 25 MG tablet Take 1 tablet (25 mg total) by mouth daily. 90 tablet 5  . ONE TOUCH ULTRA TEST test strip USE TO CHECK BLOOD SUGAR 3  TO 4 TIMES DAILY 400 each 0  . potassium chloride (K-DUR) 10 MEQ tablet Take 2 tablets (20 mEq total) by mouth daily. 180 tablet 3  . repaglinide (PRANDIN) 2 MG tablet TAKE 1 TABLET BY MOUTH 3  TIMES A DAY BEFORE MEALS 270 tablet 3  .  ULORIC 40 MG tablet Take 1 tablet by mouth daily.    . nitroGLYCERIN (NITROSTAT) 0.4 MG SL tablet Place 1 tablet (0.4 mg total) under the tongue every 5 (five) minutes as needed for chest pain. (Patient not taking: Reported on 06/21/2016) 25 tablet 3  . predniSONE (DELTASONE) 10 MG tablet Take 1 tablet (10 mg total) by mouth daily with breakfast. For acute gout flare (Patient not taking: Reported on 06/21/2016) 30 tablet 0  . scopolamine (TRANSDERM-SCOP) 1 MG/3DAYS Place 1 patch (1.5 mg total) onto the skin every 3 (three) days. Apply at least 4 hrs prior to exposure (Patient not taking: Reported on 06/21/2016) 4 patch 0   No current facility-administered medications for this visit.    Facility-Administered Medications Ordered in Other Visits  Medication Dose Route Frequency Provider Last Rate Last Dose  . 0.9 %  sodium chloride infusion   Intravenous Continuous Burgess Estelle, MD 150 mL/hr at 10/03/15 1607      PHYSICAL EXAMINATION:  ECOG PERFORMANCE STATUS: 0 - Asymptomatic  Vitals:   06/21/16 1325  BP: (!) 153/60  Pulse: 80  Resp: 18  Temp: 97.8 F (36.6 C)   Filed Weights   06/21/16 1325  Weight: 189 lb (85.7 kg)    GENERAL:alert, no distress and comfortable SKIN: skin color, texture, turgor are normal, no rashes or significant lesions EYES: normal, Conjunctiva are pink and non-injected, sclera clear OROPHARYNX:no exudate, no erythema and lips, buccal mucosa, and tongue normal  NECK: supple, thyroid normal size, non-tender, without nodularity LYMPH:  no palpable lymphadenopathy in the cervical, axillary or inguinal LUNGS: clear to auscultation and percussion with normal breathing effort HEART: regular rate & rhythm and no murmurs and no lower extremity edema ABDOMEN:abdomen soft, non-tender and normal bowel sounds Musculoskeletal:no cyanosis of digits and no clubbing  NEURO: alert & oriented x 3 with fluent speech, no focal motor/sensory deficits BREAST: (+) diffused skin  pigmentation of R breast with mild lymphedema,  and firmness in the lower outer quadrant of right breast. No discrete mass. Exam of the left breast and bilateral axilla were negative.  LABORATORY DATA:  I have reviewed  the data as listed CBC Latest Ref Rng & Units 06/21/2016 12/12/2015 10/03/2015  WBC 3.9 - 10.3 10e3/uL 8.1 6.8 7.8  Hemoglobin 11.6 - 15.9 g/dL 9.6(L) 9.7(L) 11.1(L)  Hematocrit 34.8 - 46.6 % 28.9(L) 30.5(L) 34.8(L)  Platelets 145 - 400 10e3/uL 336 386 312     CMP Latest Ref Rng & Units 06/21/2016 05/24/2016 02/02/2016  Glucose 70 - 140 mg/dl 148(H) 147(H) 169(H)  BUN 7.0 - 26.0 mg/dL 46.3(H) 36(H) 53(H)  Creatinine 0.6 - 1.1 mg/dL 1.8(H) 1.79(H) 2.16(H)  Sodium 136 - 145 mEq/L 137 140 141  Potassium 3.5 - 5.1 mEq/L 4.1 4.2 4.4  Chloride 96 - 106 mmol/L - 98 99  CO2 22 - 29 mEq/L 26 23 24   Calcium 8.4 - 10.4 mg/dL 10.1 9.9 9.7  Total Protein 6.4 - 8.3 g/dL 8.9(H) - 8.0  Total Bilirubin 0.20 - 1.20 mg/dL 0.28 - <0.2  Alkaline Phos 40 - 150 U/L 72 - 77  AST 5 - 34 U/L 13 - 14  ALT 0 - 55 U/L 15 - 18      RADIOGRAPHIC STUDIES: I have personally reviewed the radiological images as listed and agreed with the findings in the report. No results found.   ASSESSMENT & PLAN: 71 y.o. African-American female, post menopause  1.  Breast cancer of the lower inner quadrant of the right breast, invasive ductal carcinoma with DCIS, pT1bN0Mo, stage IA,   ER+ and PR +, HER 2 negative -I have reviewed her medical records extensively, and confirmed acute findings with patient.  -She has had very early stage breast cancer, and complete surgical resection, her risk of recurrence is low based on the Oncotype.  -She is clinically doing well, does report soreness in the right axilla and the right breast, she was seen by her surgeon Dr. [name] last week, who will order bilateral breast MRI  -Lab results reviewed with her, she is stable C daily and anemia. Exam was unremarkable except the fullness  in the outer lower quadrant of right breast, pending breast MRI evaluation. -Mammogram/bone density scan last year - normal - I encouraged her to do exams at home - f/u in 6 months if MRI is normal. I have advised her to call if results are abnormal - I will provide a copy of lab results -She is due for mammogram in December, I'll order a her next visit.  2. Anemia - present back to 2012. Appears secondary from CKD and other chronic diseases by evaluation as noted and seems asymptomatic.  - hemoglobin 9.6 today, overall stable. -I encouraged her to continue daily iron supplement - I have encouraged her to eat more iron rich foods and take multivitamins - Patient receives a B12 shot every month at PCP's office - I will check iron levels every 6 months   3. Low Back, right hip and RLE pain/stiffness from degenerative arthritis and spinal stenosis - f/u with Orthopedics   4. DM - manged by PCP - I encouraged her to exercise to help with her DM - I strongly advised the patient to watch her BP and glucose levels.  5. Chronic renal insufficieny, CKD III - Lab review, creatinine 1.8, overall stable.  6. CAD, HTN - followed by Dr. Johnsie Cancel   7. Genetics - Patient has multiple family members who have/had cancer - She has had genetic testing which came back normal   PLAN  - I will provide a copy of her labs  - check iron levels on next visit  -  refill anastrozole with 6 months supply  -Return to clinic in 6 months -Dr. [name] has ordered a bilateral breast MRI, she'll call me after the scan   No problem-specific Assessment & Plan notes found for this encounter.   Orders Placed This Encounter  Procedures  . CBC with Differential    Standing Status:   Standing    Number of Occurrences:   30    Standing Expiration Date:   06/21/2021  . Comprehensive metabolic panel    Standing Status:   Standing    Number of Occurrences:   30    Standing Expiration Date:   06/21/2021  . Vitamin D  25 hydroxy    Standing Status:   Standing    Number of Occurrences:   30    Standing Expiration Date:   06/21/2021  . Ferritin    Standing Status:   Standing    Number of Occurrences:   20    Standing Expiration Date:   06/21/2021  . Iron and TIBC    Standing Status:   Standing    Number of Occurrences:   20    Standing Expiration Date:   06/21/2021   All questions were answered. The patient knows to call the clinic with any problems, questions or concerns. No barriers to learning was detected. I spent 30 minutes counseling the patient face to face. The total time spent in the appointment was 35 minutes and more than 50% was on counseling and review of test results   This document serves as a record of services personally performed by Truitt Merle, MD. It was created on her behalf by Brandt Loosen, a trained medical scribe. The creation of this record is based on the scribe's personal observations and the provider's statements to them. This document has been checked and approved by the attending provider.   Truitt Merle, MD 06/21/2016

## 2016-06-16 NOTE — Progress Notes (Signed)
This encounter was created in error - please disregard.

## 2016-06-19 DIAGNOSIS — C50312 Malignant neoplasm of lower-inner quadrant of left female breast: Secondary | ICD-10-CM | POA: Diagnosis not present

## 2016-06-19 DIAGNOSIS — N183 Chronic kidney disease, stage 3 (moderate): Secondary | ICD-10-CM | POA: Diagnosis not present

## 2016-06-19 DIAGNOSIS — I129 Hypertensive chronic kidney disease with stage 1 through stage 4 chronic kidney disease, or unspecified chronic kidney disease: Secondary | ICD-10-CM | POA: Diagnosis not present

## 2016-06-19 DIAGNOSIS — M109 Gout, unspecified: Secondary | ICD-10-CM | POA: Diagnosis not present

## 2016-06-19 DIAGNOSIS — E1129 Type 2 diabetes mellitus with other diabetic kidney complication: Secondary | ICD-10-CM | POA: Diagnosis not present

## 2016-06-21 ENCOUNTER — Telehealth: Payer: Self-pay | Admitting: Hematology

## 2016-06-21 ENCOUNTER — Encounter: Payer: Self-pay | Admitting: Hematology

## 2016-06-21 ENCOUNTER — Other Ambulatory Visit (HOSPITAL_BASED_OUTPATIENT_CLINIC_OR_DEPARTMENT_OTHER): Payer: Medicare Other

## 2016-06-21 ENCOUNTER — Ambulatory Visit (HOSPITAL_BASED_OUTPATIENT_CLINIC_OR_DEPARTMENT_OTHER): Payer: Medicare Other | Admitting: Hematology

## 2016-06-21 VITALS — BP 153/60 | HR 80 | Temp 97.8°F | Resp 18 | Ht 65.0 in | Wt 189.0 lb

## 2016-06-21 DIAGNOSIS — Z853 Personal history of malignant neoplasm of breast: Secondary | ICD-10-CM

## 2016-06-21 DIAGNOSIS — D649 Anemia, unspecified: Secondary | ICD-10-CM | POA: Diagnosis not present

## 2016-06-21 DIAGNOSIS — N183 Chronic kidney disease, stage 3 (moderate): Secondary | ICD-10-CM | POA: Diagnosis not present

## 2016-06-21 DIAGNOSIS — Z170 Estrogen receptor positive status [ER+]: Secondary | ICD-10-CM

## 2016-06-21 DIAGNOSIS — M545 Low back pain: Secondary | ICD-10-CM | POA: Diagnosis not present

## 2016-06-21 DIAGNOSIS — C50311 Malignant neoplasm of lower-inner quadrant of right female breast: Secondary | ICD-10-CM

## 2016-06-21 DIAGNOSIS — N184 Chronic kidney disease, stage 4 (severe): Secondary | ICD-10-CM

## 2016-06-21 DIAGNOSIS — D631 Anemia in chronic kidney disease: Secondary | ICD-10-CM

## 2016-06-21 DIAGNOSIS — E119 Type 2 diabetes mellitus without complications: Secondary | ICD-10-CM

## 2016-06-21 DIAGNOSIS — I10 Essential (primary) hypertension: Secondary | ICD-10-CM

## 2016-06-21 DIAGNOSIS — M25551 Pain in right hip: Secondary | ICD-10-CM

## 2016-06-21 LAB — CBC WITH DIFFERENTIAL/PLATELET
BASO%: 0.3 % (ref 0.0–2.0)
Basophils Absolute: 0 10*3/uL (ref 0.0–0.1)
EOS%: 1 % (ref 0.0–7.0)
Eosinophils Absolute: 0.1 10*3/uL (ref 0.0–0.5)
HCT: 28.9 % — ABNORMAL LOW (ref 34.8–46.6)
HGB: 9.6 g/dL — ABNORMAL LOW (ref 11.6–15.9)
LYMPH%: 21.7 % (ref 14.0–49.7)
MCH: 25.9 pg (ref 25.1–34.0)
MCHC: 33.2 g/dL (ref 31.5–36.0)
MCV: 78 fL — ABNORMAL LOW (ref 79.5–101.0)
MONO#: 0.5 10*3/uL (ref 0.1–0.9)
MONO%: 5.9 % (ref 0.0–14.0)
NEUT#: 5.7 10*3/uL (ref 1.5–6.5)
NEUT%: 71.1 % (ref 38.4–76.8)
PLATELETS: 336 10*3/uL (ref 145–400)
RBC: 3.7 10*6/uL (ref 3.70–5.45)
RDW: 15.1 % — ABNORMAL HIGH (ref 11.2–14.5)
WBC: 8.1 10*3/uL (ref 3.9–10.3)
lymph#: 1.8 10*3/uL (ref 0.9–3.3)

## 2016-06-21 LAB — COMPREHENSIVE METABOLIC PANEL
ALT: 15 U/L (ref 0–55)
ANION GAP: 10 meq/L (ref 3–11)
AST: 13 U/L (ref 5–34)
Albumin: 3.9 g/dL (ref 3.5–5.0)
Alkaline Phosphatase: 72 U/L (ref 40–150)
BUN: 46.3 mg/dL — ABNORMAL HIGH (ref 7.0–26.0)
CHLORIDE: 101 meq/L (ref 98–109)
CO2: 26 meq/L (ref 22–29)
Calcium: 10.1 mg/dL (ref 8.4–10.4)
Creatinine: 1.8 mg/dL — ABNORMAL HIGH (ref 0.6–1.1)
EGFR: 33 mL/min/{1.73_m2} — ABNORMAL LOW (ref >90)
Glucose: 148 mg/dl — ABNORMAL HIGH (ref 70–140)
POTASSIUM: 4.1 meq/L (ref 3.5–5.1)
Sodium: 137 mEq/L (ref 136–145)
Total Bilirubin: 0.28 mg/dL (ref 0.20–1.20)
Total Protein: 8.9 g/dL — ABNORMAL HIGH (ref 6.4–8.3)

## 2016-06-21 MED ORDER — ANASTROZOLE 1 MG PO TABS: 1.0000 mg | ORAL_TABLET | Freq: Every day | ORAL | 1 refills | Status: DC

## 2016-06-21 NOTE — Telephone Encounter (Signed)
Gave patient AVS and calender per 06/21/16 los. - 6 month f/u and lab

## 2016-06-22 LAB — VITAMIN D 25 HYDROXY (VIT D DEFICIENCY, FRACTURES): Vitamin D, 25-Hydroxy: 13.7 ng/mL — ABNORMAL LOW (ref 30.0–100.0)

## 2016-06-26 ENCOUNTER — Other Ambulatory Visit: Payer: Self-pay | Admitting: *Deleted

## 2016-06-26 DIAGNOSIS — M25561 Pain in right knee: Secondary | ICD-10-CM | POA: Diagnosis not present

## 2016-06-26 NOTE — Telephone Encounter (Signed)
Patient going on a cruise & requesting prednisone in case of gout flare up. Thanks!

## 2016-06-27 MED ORDER — PREDNISONE 10 MG PO TABS: 10.0000 mg | ORAL_TABLET | Freq: Every day | ORAL | 0 refills | Status: DC

## 2016-07-03 ENCOUNTER — Telehealth: Payer: Self-pay | Admitting: Dietician

## 2016-07-03 ENCOUNTER — Encounter: Payer: Self-pay | Admitting: Dietician

## 2016-07-03 NOTE — Telephone Encounter (Signed)
Left a message for patient to call me or our front office to reschedule her May 11th appointment

## 2016-07-03 NOTE — Telephone Encounter (Signed)
[facility] office rescheduled appointment

## 2016-07-07 ENCOUNTER — Other Ambulatory Visit: Payer: Self-pay | Admitting: Internal Medicine

## 2016-07-19 ENCOUNTER — Emergency Department (HOSPITAL_COMMUNITY): Payer: Medicare Other

## 2016-07-19 ENCOUNTER — Inpatient Hospital Stay (HOSPITAL_COMMUNITY)
Admission: EM | Admit: 2016-07-19 | Discharge: 2016-07-21 | DRG: 389 | Disposition: A | Discharge status: Home / Self Care | Payer: Medicare Other | Attending: Internal Medicine | Admitting: Internal Medicine

## 2016-07-19 ENCOUNTER — Encounter (HOSPITAL_COMMUNITY): Payer: Self-pay

## 2016-07-19 DIAGNOSIS — E1122 Type 2 diabetes mellitus with diabetic chronic kidney disease: Secondary | ICD-10-CM | POA: Diagnosis present

## 2016-07-19 DIAGNOSIS — I5032 Chronic diastolic (congestive) heart failure: Secondary | ICD-10-CM | POA: Diagnosis not present

## 2016-07-19 DIAGNOSIS — J301 Allergic rhinitis due to pollen: Secondary | ICD-10-CM | POA: Diagnosis present

## 2016-07-19 DIAGNOSIS — Z794 Long term (current) use of insulin: Secondary | ICD-10-CM

## 2016-07-19 DIAGNOSIS — Z90722 Acquired absence of ovaries, bilateral: Secondary | ICD-10-CM

## 2016-07-19 DIAGNOSIS — K56609 Unspecified intestinal obstruction, unspecified as to partial versus complete obstruction: Secondary | ICD-10-CM | POA: Diagnosis not present

## 2016-07-19 DIAGNOSIS — I130 Hypertensive heart and chronic kidney disease with heart failure and stage 1 through stage 4 chronic kidney disease, or unspecified chronic kidney disease: Secondary | ICD-10-CM | POA: Diagnosis not present

## 2016-07-19 DIAGNOSIS — Z9049 Acquired absence of other specified parts of digestive tract: Secondary | ICD-10-CM | POA: Diagnosis not present

## 2016-07-19 DIAGNOSIS — E119 Type 2 diabetes mellitus without complications: Secondary | ICD-10-CM | POA: Diagnosis not present

## 2016-07-19 DIAGNOSIS — D539 Nutritional anemia, unspecified: Secondary | ICD-10-CM | POA: Diagnosis present

## 2016-07-19 DIAGNOSIS — N2581 Secondary hyperparathyroidism of renal origin: Secondary | ICD-10-CM | POA: Diagnosis not present

## 2016-07-19 DIAGNOSIS — G8929 Other chronic pain: Secondary | ICD-10-CM | POA: Diagnosis present

## 2016-07-19 DIAGNOSIS — Z885 Allergy status to narcotic agent status: Secondary | ICD-10-CM | POA: Diagnosis not present

## 2016-07-19 DIAGNOSIS — Z79899 Other long term (current) drug therapy: Secondary | ICD-10-CM | POA: Diagnosis not present

## 2016-07-19 DIAGNOSIS — I10 Essential (primary) hypertension: Secondary | ICD-10-CM | POA: Diagnosis not present

## 2016-07-19 DIAGNOSIS — I4510 Unspecified right bundle-branch block: Secondary | ICD-10-CM | POA: Diagnosis present

## 2016-07-19 DIAGNOSIS — I252 Old myocardial infarction: Secondary | ICD-10-CM | POA: Diagnosis not present

## 2016-07-19 DIAGNOSIS — E1140 Type 2 diabetes mellitus with diabetic neuropathy, unspecified: Secondary | ICD-10-CM | POA: Diagnosis present

## 2016-07-19 DIAGNOSIS — M109 Gout, unspecified: Secondary | ICD-10-CM | POA: Diagnosis not present

## 2016-07-19 DIAGNOSIS — Z853 Personal history of malignant neoplasm of breast: Secondary | ICD-10-CM | POA: Diagnosis not present

## 2016-07-19 DIAGNOSIS — N183 Chronic kidney disease, stage 3 (moderate): Secondary | ICD-10-CM | POA: Diagnosis not present

## 2016-07-19 DIAGNOSIS — M329 Systemic lupus erythematosus, unspecified: Secondary | ICD-10-CM | POA: Diagnosis not present

## 2016-07-19 DIAGNOSIS — Z7902 Long term (current) use of antithrombotics/antiplatelets: Secondary | ICD-10-CM

## 2016-07-19 DIAGNOSIS — I2510 Atherosclerotic heart disease of native coronary artery without angina pectoris: Secondary | ICD-10-CM | POA: Diagnosis not present

## 2016-07-19 DIAGNOSIS — Z8673 Personal history of transient ischemic attack (TIA), and cerebral infarction without residual deficits: Secondary | ICD-10-CM

## 2016-07-19 DIAGNOSIS — Z923 Personal history of irradiation: Secondary | ICD-10-CM

## 2016-07-19 DIAGNOSIS — Z980 Intestinal bypass and anastomosis status: Secondary | ICD-10-CM

## 2016-07-19 DIAGNOSIS — E785 Hyperlipidemia, unspecified: Secondary | ICD-10-CM | POA: Diagnosis not present

## 2016-07-19 DIAGNOSIS — R109 Unspecified abdominal pain: Secondary | ICD-10-CM | POA: Diagnosis not present

## 2016-07-19 DIAGNOSIS — Z7982 Long term (current) use of aspirin: Secondary | ICD-10-CM

## 2016-07-19 DIAGNOSIS — Z888 Allergy status to other drugs, medicaments and biological substances status: Secondary | ICD-10-CM

## 2016-07-19 DIAGNOSIS — R1110 Vomiting, unspecified: Secondary | ICD-10-CM | POA: Diagnosis not present

## 2016-07-19 DIAGNOSIS — Z886 Allergy status to analgesic agent status: Secondary | ICD-10-CM

## 2016-07-19 DIAGNOSIS — K56600 Partial intestinal obstruction, unspecified as to cause: Secondary | ICD-10-CM | POA: Diagnosis not present

## 2016-07-19 DIAGNOSIS — Z9079 Acquired absence of other genital organ(s): Secondary | ICD-10-CM

## 2016-07-19 DIAGNOSIS — Z955 Presence of coronary angioplasty implant and graft: Secondary | ICD-10-CM

## 2016-07-19 LAB — URINALYSIS, ROUTINE W REFLEX MICROSCOPIC
BACTERIA UA: NONE SEEN
Bilirubin Urine: NEGATIVE
Glucose, UA: 50 mg/dL — AB
Hgb urine dipstick: NEGATIVE
KETONES UR: NEGATIVE mg/dL
Nitrite: NEGATIVE
PROTEIN: 30 mg/dL — AB
Specific Gravity, Urine: 1.014 (ref 1.005–1.030)
pH: 6 (ref 5.0–8.0)

## 2016-07-19 LAB — CBC WITH DIFFERENTIAL/PLATELET
Basophils Absolute: 0 10*3/uL (ref 0.0–0.1)
Basophils Relative: 0 %
Eosinophils Absolute: 0 10*3/uL (ref 0.0–0.7)
Eosinophils Relative: 0 %
HEMATOCRIT: 30.9 % — AB (ref 36.0–46.0)
Hemoglobin: 9.9 g/dL — ABNORMAL LOW (ref 12.0–15.0)
Lymphocytes Relative: 8 %
Lymphs Abs: 1 10*3/uL (ref 0.7–4.0)
MCH: 25.3 pg — ABNORMAL LOW (ref 26.0–34.0)
MCHC: 32 g/dL (ref 30.0–36.0)
MCV: 79 fL (ref 78.0–100.0)
MONO ABS: 0.2 10*3/uL (ref 0.1–1.0)
Monocytes Relative: 2 %
NEUTROS ABS: 11 10*3/uL — AB (ref 1.7–7.7)
NEUTROS PCT: 90 %
PLATELETS: 291 10*3/uL (ref 150–400)
RBC: 3.91 MIL/uL (ref 3.87–5.11)
RDW: 15.1 % (ref 11.5–15.5)
WBC: 12.3 10*3/uL — AB (ref 4.0–10.5)

## 2016-07-19 LAB — COMPREHENSIVE METABOLIC PANEL
ALBUMIN: 3.9 g/dL (ref 3.5–5.0)
ALT: 24 U/L (ref 14–54)
AST: 25 U/L (ref 15–41)
Alkaline Phosphatase: 60 U/L (ref 38–126)
Anion gap: 12 (ref 5–15)
BILIRUBIN TOTAL: 0.4 mg/dL (ref 0.3–1.2)
BUN: 44 mg/dL — AB (ref 6–20)
CHLORIDE: 100 mmol/L — AB (ref 101–111)
CO2: 25 mmol/L (ref 22–32)
Calcium: 10.2 mg/dL (ref 8.9–10.3)
Creatinine, Ser: 1.78 mg/dL — ABNORMAL HIGH (ref 0.44–1.00)
GFR calc Af Amer: 32 mL/min — ABNORMAL LOW (ref >60)
GFR calc non Af Amer: 28 mL/min — ABNORMAL LOW (ref >60)
GLUCOSE: 258 mg/dL — AB (ref 65–99)
POTASSIUM: 3.5 mmol/L (ref 3.5–5.1)
Sodium: 137 mmol/L (ref 135–145)
Total Protein: 8.7 g/dL — ABNORMAL HIGH (ref 6.5–8.1)

## 2016-07-19 LAB — GLUCOSE, CAPILLARY
Glucose-Capillary: 137 mg/dL — ABNORMAL HIGH (ref 65–99)
Glucose-Capillary: 146 mg/dL — ABNORMAL HIGH (ref 65–99)

## 2016-07-19 LAB — MAGNESIUM: MAGNESIUM: 1.5 mg/dL — AB (ref 1.7–2.4)

## 2016-07-19 LAB — PHOSPHORUS: PHOSPHORUS: 1.9 mg/dL — AB (ref 2.5–4.6)

## 2016-07-19 LAB — LIPASE, BLOOD: Lipase: 21 U/L (ref 11–51)

## 2016-07-19 LAB — CBG MONITORING, ED: Glucose-Capillary: 174 mg/dL — ABNORMAL HIGH (ref 65–99)

## 2016-07-19 MED ORDER — IOPAMIDOL (ISOVUE-300) INJECTION 61%
INTRAVENOUS | Status: AC
  Administered 2016-07-19: 75 mL via INTRAVENOUS
  Filled 2016-07-19: qty 75

## 2016-07-19 MED ORDER — FENTANYL CITRATE (PF) 100 MCG/2ML IJ SOLN
50.0000 ug | Freq: Once | INTRAMUSCULAR | Status: AC
  Administered 2016-07-19: 50 ug via INTRAVENOUS
  Filled 2016-07-19: qty 2

## 2016-07-19 MED ORDER — ACETAMINOPHEN 325 MG PO TABS: 650.0000 mg | ORAL_TABLET | Freq: Four times a day (QID) | ORAL | Status: DC | PRN

## 2016-07-19 MED ORDER — ONDANSETRON 4 MG PO TBDP
ORAL_TABLET | ORAL | Status: AC
  Administered 2016-07-19: 4 mg
  Filled 2016-07-19: qty 1

## 2016-07-19 MED ORDER — ONDANSETRON HCL 4 MG/2ML IJ SOLN
4.0000 mg | Freq: Once | INTRAMUSCULAR | Status: AC
  Administered 2016-07-19: 4 mg via INTRAVENOUS
  Filled 2016-07-19: qty 2

## 2016-07-19 MED ORDER — SODIUM CHLORIDE 0.9 % IV BOLUS (SEPSIS)
1000.0000 mL | Freq: Once | INTRAVENOUS | Status: AC
  Administered 2016-07-19: 1000 mL via INTRAVENOUS

## 2016-07-19 MED ORDER — INSULIN DETEMIR 100 UNIT/ML ~~LOC~~ SOLN
15.0000 [IU] | Freq: Every day | SUBCUTANEOUS | Status: DC
  Administered 2016-07-19 – 2016-07-20 (×2): 15 [IU] via SUBCUTANEOUS
  Filled 2016-07-19 (×4): qty 0.15

## 2016-07-19 MED ORDER — HEPARIN SODIUM (PORCINE) 5000 UNIT/ML IJ SOLN
5000.0000 [IU] | Freq: Three times a day (TID) | INTRAMUSCULAR | Status: DC
  Administered 2016-07-19 – 2016-07-21 (×7): 5000 [IU] via SUBCUTANEOUS
  Filled 2016-07-19 (×7): qty 1

## 2016-07-19 MED ORDER — ONDANSETRON HCL 4 MG/2ML IJ SOLN: 4.0000 mg | Freq: Four times a day (QID) | INTRAMUSCULAR | Status: DC | PRN

## 2016-07-19 MED ORDER — NITROGLYCERIN 0.4 MG SL SUBL: 0.4000 mg | SUBLINGUAL_TABLET | SUBLINGUAL | Status: DC | PRN

## 2016-07-19 MED ORDER — ASPIRIN EC 81 MG PO TBEC
81.0000 mg | DELAYED_RELEASE_TABLET | Freq: Every day | ORAL | Status: DC
  Administered 2016-07-20 – 2016-07-21 (×2): 81 mg via ORAL
  Filled 2016-07-19 (×2): qty 1

## 2016-07-19 MED ORDER — HYDROMORPHONE HCL 1 MG/ML IJ SOLN
0.2500 mg | INTRAMUSCULAR | Status: DC | PRN
Start: 2016-07-19 — End: 2016-07-21
  Administered 2016-07-19: 0.25 mg via INTRAVENOUS
  Filled 2016-07-19: qty 1

## 2016-07-19 MED ORDER — ONDANSETRON HCL 4 MG PO TABS
4.0000 mg | ORAL_TABLET | Freq: Four times a day (QID) | ORAL | Status: DC | PRN
  Administered 2016-07-19: 4 mg via ORAL
  Filled 2016-07-19: qty 1

## 2016-07-19 MED ORDER — INSULIN ASPART 100 UNIT/ML ~~LOC~~ SOLN
0.0000 [IU] | SUBCUTANEOUS | Status: DC
  Administered 2016-07-19 – 2016-07-20 (×4): 1 [IU] via SUBCUTANEOUS
  Administered 2016-07-20: 2 [IU] via SUBCUTANEOUS
  Administered 2016-07-20 – 2016-07-21 (×5): 1 [IU] via SUBCUTANEOUS
  Administered 2016-07-21: 2 [IU] via SUBCUTANEOUS

## 2016-07-19 MED ORDER — ACETAMINOPHEN 650 MG RE SUPP: 650.0000 mg | Freq: Four times a day (QID) | RECTAL | Status: DC | PRN

## 2016-07-19 MED ORDER — KCL IN DEXTROSE-NACL 20-5-0.45 MEQ/L-%-% IV SOLN
INTRAVENOUS | Status: DC
  Administered 2016-07-19 – 2016-07-20 (×2): via INTRAVENOUS
  Administered 2016-07-21: 1 mL via INTRAVENOUS
  Filled 2016-07-19 (×3): qty 1000

## 2016-07-19 MED ORDER — LABETALOL HCL 5 MG/ML IV SOLN
5.0000 mg | INTRAVENOUS | Status: DC | PRN
  Administered 2016-07-19: 5 mg via INTRAVENOUS
  Filled 2016-07-19 (×3): qty 4

## 2016-07-19 NOTE — ED Notes (Signed)
oob to bedside commode with assist.

## 2016-07-19 NOTE — H&P (Signed)
Date: 07/19/2016               Patient Name:  [name] MRN: [id number]  DOB: 09/23/1945 Age / Sex: 71 y.o., female   PCP: Bartholomew Crews, MD         Medical Service: Internal Medicine Teaching Service         Attending Physician: Dr. Axel Filler, MD    First Contact: Dr. Danford Bad Pager: [phone]  Second Contact: Dr. Benjamine Mola Pager: [phone]       After Hours (After 5p/  First Contact Pager: [phone]  weekends / holidays): Second Contact Pager: [phone]   Chief Complaint: Vomiting, abdominal pain  History of Present Illness: 71 year old woman with history of presumptive diagnosis of Crohn's disease status post ileocolectomy in 2003 with pathology negative for Crohn's, chronic anemia, breast cancer s/p lumpectomy and rad tx in 2015, CAD status post DES in 2006, CHF, CAD, DM 2 presenting with abdominal pain and vomiting. Started this morning around 4AM. Located periumbilical. Sometimes sharp. Comes and goes. Worse when she is vomiting. Trying to have a BM makes it better. Has had 6 episodes of vomiting. Maybe bilious but nonbloody. Last BM was yesterday. It was normal. Sunday had diarrhea. No hematochezia or melena. Similar presentation 1-2 years ago. She has not had anything to eat. Still passing flatus. She had small bowel surgery before in 2003. Has had multiple abdominal surgeries.   No fevers or chills. No vision changes. No cough. No chest pain or dyspnea. No dysuria. No myagias or paresthesias.  Meds:  Current Meds  Medication Sig  . acetaminophen (TYLENOL) 325 MG tablet Take 650 mg by mouth every 6 (six) hours as needed for mild pain.  Marland Kitchen amLODipine (NORVASC) 10 MG tablet Take 1 tablet by mouth  daily  . anastrozole (ARIMIDEX) 1 MG tablet Take 1 tablet (1 mg total) by mouth daily.  Marland Kitchen aspirin EC 81 MG tablet Take 81 mg by mouth daily.  Marland Kitchen atorvastatin (LIPITOR) 20 MG tablet Take 1 tablet by mouth at  bedtime  . carvedilol (COREG) 6.25 MG tablet Take 6.25 mg by mouth 2  (two) times daily.  . clopidogrel (PLAVIX) 75 MG tablet TAKE 1 TABLET BY MOUTH ONCE A DAY  . desloratadine (CLARINEX) 5 MG tablet Take 5 mg by mouth at bedtime as needed (allergies). Reported on 04/11/2015  . fenofibrate 54 MG tablet Take 54 mg by mouth daily.   . ferrous fumarate (HEMOCYTE - 106 MG FE) 325 (106 Fe) MG TABS tablet Take 1 tablet (106 mg of iron total) by mouth daily.  . furosemide (LASIX) 20 MG tablet TAKE 1 TABLET BY MOUTH TWO  TIMES DAILY  . gabapentin (NEURONTIN) 300 MG capsule Take 1 capsule (300 mg total) by mouth 2 (two) times daily.  . hydrOXYzine (VISTARIL) 50 MG capsule TAKE ONE CAPSULE BY MOUTH ONCE DAILY AT BEDTIME AS NEEDED  . Insulin Detemir (LEVEMIR FLEXTOUCH) 100 UNIT/ML Pen Inject 36 UNITS SUBCUTANEOUSLY at the same time each day  . Insulin Pen Needle (B-D UF III MINI PEN NEEDLES) 31G X 5 MM MISC Use to inject insulin one time a day  . loperamide (IMODIUM) 2 MG capsule TAKE ONE CAPSULE BY MOUTH ONCE DAILY AS NEEDED FOR DIARRHEA OR LOOSE STOOLS  . losartan (COZAAR) 25 MG tablet Take 1 tablet (25 mg total) by mouth daily.  . nitroGLYCERIN (NITROSTAT) 0.4 MG SL tablet Place 1 tablet (0.4 mg total) under the tongue every 5 (five)  minutes as needed for chest pain.  . ONE TOUCH ULTRA TEST test strip USE TO CHECK BLOOD SUGAR 3  TO 4 TIMES DAILY  . potassium chloride (K-DUR) 10 MEQ tablet Take 2 tablets (20 mEq total) by mouth daily.  . repaglinide (PRANDIN) 2 MG tablet TAKE 1 TABLET BY MOUTH 3  TIMES A DAY BEFORE MEALS  . ULORIC 40 MG tablet Take 1 tablet by mouth daily.     Allergies: Allergies as of 07/19/2016 - Review Complete 07/19/2016  Allergen Reaction Noted  . Percocet [oxycodone-acetaminophen] Other (See Comments) 05/05/2013  . Diazepam Other (See Comments) 04/17/2006  . Haloperidol lactate Nausea And Vomiting   . Lorazepam Nausea Only   . Morphine sulfate Other (See Comments) 04/17/2006  . Propoxyphene hcl Nausea And Vomiting 05/16/2007  . Tramadol hcl  Nausea And Vomiting    Past Medical History:  Diagnosis Date  . Abdominal discomfort    Chronic N/V/D. Presumptive dx Crohn's dx per elevated p ANCA. Failed Entocort and Pentasa. Sep 2003 - ileocolectomy c anastomosis per Dr Deon Pilling 2/2 adhesions - path was hegative for Crohns. EGD, Sm bowel follow through (11/03), and an eteroclysis (10/03) were unrevealing. Cuases hypomag and hypocalcemia.  . Adnexal mass 8/03   s/p lap BSO (R ovarian fibroma) & lysis of adhesions  . Allergy    Seasonal  . Anemia    Multifactorial. Baseline HgB 10-11 ish. B12 def - 150 in 3/10. Fe Def - ferritin 35 3/10. Both are being repleted.  . Breast cancer ([place]) 03/16/13   right, 5 o'clock  . CAD (coronary artery disease) 1996   1996 - PTCA and angioplasty diagonal branch. 2000 - Rotoblator & angiopllasty of diagonal. 2006 - subendocardial AMI, DES to proximal LAD.Marland Kitchen Also had 90% stenosis in distal apical LAD. EF 55 with apical hypokinesis. Indefinite ASA and Plavix.  . CHF (congestive heart failure) ([place])   . Chronic kidney disease    Chronic renal insuff baseline Cr 1.2 - 1.4 ish.  . Chronic pain    CT 10/10 = Spinal stenosis L2 - S1.  . Diabetes mellitus    Insulin dependent  . Hx of radiation therapy 06/02/13- 07/16/13   right rbeast 4500 cGy 25 sessions, right breast boost 1600 cGy in 8 sessions  . Hyperlipidemia    Managed with both a statin and Welchol. Welchol stopped 2014 2/2 cost and started on fenofibrate   . Hypertension    2006 B renal arteries patent. 2003 MRA - no RAS. 2003 pheo W/U Dr Hassell Done reportedly negative.  . Lupus   . RBBB   . Stroke [facility])    Incidental finding MRI 2002 L lacunar infarct  . Wears dentures    top    Family History: Mother had lung and breast cancer - deceased. Father - deceased. Maternal grandmother had colon cancer. Brother had pancreatic cancer  Social History: Never smoker. Never EtOH. Never illicits.  Review of Systems: A complete ROS was negative except as per  HPI.   Physical Exam: Blood pressure (!) 149/72, pulse 88, resp. rate 17, height 4' 7"  (1.397 m), weight 189 lb (85.7 kg), SpO2 98 %. General Apperance: NAD Head: Normocephalic, atraumatic Eyes: PERRL, EOMI, anicteric sclera Ears: Normal external ear canal Nose: Nares normal, septum midline, mucosa normal Throat: Lips, mucosa and tongue normal  Neck: Supple, trachea midline Back: No tenderness or bony abnormality  Lungs: Clear to auscultation bilaterally. No wheezes, rhonchi or rales. Breathing comfortably Chest Wall: Nontender, no deformity Heart: Regular rate  and rhythm, no murmur/rub/gallop Abdomen: Soft, tenderness to palpation throughout, distended, no rebound/guarding Extremities: Normal, atraumatic, warm and well perfused, no edema Pulses: 2+ throughout Skin: No rashes or lesions Neurologic: Alert and oriented x 3. CNII-XII intact. Normal strength and sensation  CT Abdomen/Pelvis: Dilated proximal and mid small bowel loops with decompressed distal small bowel loops. Suspect mild fatty infiltration of the liver.  Assessment & Plan by Problem: 71 year old woman with history of presumptive diagnosis of Crohn's disease status post ileocolectomy in 2003 with pathology negative for Crohn's, chronic anemia, breast cancer s/p lumpectomy and rad tx in 2015, CAD status post DES in 2006, CHF, CAD, DM 2 presenting with abdominal pain and vomiting.   Small Bowel Obstruction: Presenting with nausea, vomiting, and abdominal pain since this morning. She has had multiple abdominal surgeries in the past and also has had previous presentations for SBO. Hemodynamically stable. Leukocytosis to 12.3. CT abd with dilated proximal and mid small bowel loops.  -Gen. surgery following, appreciate recommendations -NG tube if emesis -NPO, D5 1/2NS +20K @ 36m/hr -Repeat abdominal x-ray in a.m. -Hold Plavix -Check magnesium and phosphorus levels -Dilaudid 0.288mq4hr prn pain  DM2: A1c 8.2 3/8 -Decrease  home Levemir to 15 units daily while NPO -Hold home repaglinide -Hold home gabapentin -CBG Q4hr and SSI  CKD3: Cr 1.8 on admission. Baseline around 1.8  CAD:  Nitrostat prn Hold home Plavix for now Continue home ASA [id number]  Chronic diastolic CHF: Last Echo in 2012 with normal EF and grade 1 DD -Hold home carvedilol and Lasix for now -Hold home losartan for now  HTN: Hypertensive to 189/90 in ED -Hold home carvedilol, losartan, and amlodpine for now -IV labetalol 5mg59mhrD3TT systolic BP >160>017ronic anemia: Hgb 9.9 on admission. Baseline around 9. Normal MCV. -Hold home iron  Hx Breast CA: Hold home anastrozole  HLD: Hold home fenofibrate and atorvastatin for now  Gout: hold home febuxostat  FEN: NPO VTE ppx: Subq hep Code: FULL  Dispo: Admit patient to Observation with expected length of stay less than 2 midnights.  Signed: JennMilagros Loll 07/19/2016, 11:08 AM  Pager: [phone]

## 2016-07-19 NOTE — ED Notes (Signed)
General surgery at beside.

## 2016-07-19 NOTE — ED Notes (Signed)
Patient transported to CT 

## 2016-07-19 NOTE — ED Provider Notes (Signed)
[facility] DEPT Provider Note   CSN: [id number] Arrival date & time: 07/19/16  0701     History   Chief Complaint Chief Complaint  Patient presents with  . Abdominal Pain  . Emesis    HPI [name] is a 71 y.o. female.  Level V caveat for urgent need for intervention. Patient presents with generalized abdominal pain and vomiting since 0400 today. Significant history for multiple small bowel production in the past. Status post 2003 ileocolectomy with anastomosis for uncertain diagnosis. No chest pain, dyspnea, diarrhea, fever, sweats, chills.      Past Medical History:  Diagnosis Date  . Abdominal discomfort    Chronic N/V/D. Presumptive dx Crohn's dx per elevated p ANCA. Failed Entocort and Pentasa. Sep 2003 - ileocolectomy c anastomosis per Dr Deon Pilling 2/2 adhesions - path was hegative for Crohns. EGD, Sm bowel follow through (11/03), and an eteroclysis (10/03) were unrevealing. Cuases hypomag and hypocalcemia.  . Adnexal mass 8/03   s/p lap BSO (R ovarian fibroma) & lysis of adhesions  . Allergy    Seasonal  . Anemia    Multifactorial. Baseline HgB 10-11 ish. B12 def - 150 in 3/10. Fe Def - ferritin 35 3/10. Both are being repleted.  . Breast cancer ([place]) 03/16/13   right, 5 o'clock  . CAD (coronary artery disease) 1996   1996 - PTCA and angioplasty diagonal branch. 2000 - Rotoblator & angiopllasty of diagonal. 2006 - subendocardial AMI, DES to proximal LAD.Marland Kitchen Also had 90% stenosis in distal apical LAD. EF 55 with apical hypokinesis. Indefinite ASA and Plavix.  . CHF (congestive heart failure) ([place])   . Chronic kidney disease    Chronic renal insuff baseline Cr 1.2 - 1.4 ish.  . Chronic pain    CT 10/10 = Spinal stenosis L2 - S1.  . Diabetes mellitus    Insulin dependent  . Hx of radiation therapy 06/02/13- 07/16/13   right rbeast 4500 cGy 25 sessions, right breast boost 1600 cGy in 8 sessions  . Hyperlipidemia    Managed with both a statin and Welchol. Welchol  stopped 2014 2/2 cost and started on fenofibrate   . Hypertension    2006 B renal arteries patent. 2003 MRA - no RAS. 2003 pheo W/U Dr Hassell Done reportedly negative.  . Lupus   . RBBB   . Stroke [facility])    Incidental finding MRI 2002 L lacunar infarct  . Wears dentures    top    Patient Active Problem List   Diagnosis Date Noted  . Hyperparathyroidism, secondary renal ([place]) 05/02/2016  . Gout 04/11/2015  . Diabetic neuropathy associated with type 2 diabetes mellitus ([place]) 01/06/2015  . Breast cancer of lower-inner quadrant of right female breast ([place]) 03/18/2013  . Chronic venous insufficiency 01/06/2013  . Health care maintenance 05/08/2011  . DM (diabetes mellitus), type 2, uncontrolled, with renal complications ([place]) 17/40/8144  . Seasonal allergies 05/03/2010  . Hypertension   . CAD (coronary artery disease)   . Abdominal discomfort   . Anemia in chronic kidney disease   . History of stroke without residual deficits   . Hyperlipidemia associated with type 2 diabetes mellitus ([place])   . Chronic pain   . Chronic kidney disease, stage III (moderate)   . Adnexal mass     Past Surgical History:  Procedure Laterality Date  . ABDOMINAL HYSTERECTOMY    . BILATERAL SALPINGOOPHORECTOMY  8/03   Lap BSO (R ovarian fibroma) and adhesion lysis  . BOWEL RESECTION  2003  ileocolectomy with anastomosis 2/2 adhesions  . BREAST LUMPECTOMY WITH NEEDLE LOCALIZATION AND AXILLARY SENTINEL LYMPH NODE BX Right 04/22/2013   Procedure: BREAST LUMPECTOMY WITH NEEDLE LOCALIZATION AND AXILLARY SENTINEL LYMPH NODE BX;  Surgeon: Stark Klein, MD;  Location: [facility];  Service: General;  Laterality: Right;  . CARDIAC CATHETERIZATION     2 stents  . CHOLECYSTECTOMY    . HEMICOLECTOMY     R sided hemicolectomy  . HERNIA REPAIR     Ventral hernia repair  . PTCA  4/06    OB History    No data available       Home Medications    Prior to Admission medications   Medication Sig Start  Date End Date Taking? Authorizing Provider  amLODipine (NORVASC) 10 MG tablet Take 1 tablet by mouth  daily 09/20/15   Bartholomew Crews, MD  anastrozole (ARIMIDEX) 1 MG tablet Take 1 tablet (1 mg total) by mouth daily. 06/21/16   Truitt Merle, MD  aspirin EC 81 MG tablet Take 81 mg by mouth daily.    Historical Provider, MD  atorvastatin (LIPITOR) 20 MG tablet Take 1 tablet by mouth at  bedtime 09/20/15   Bartholomew Crews, MD  carvedilol (COREG) 6.25 MG tablet Take 6.25 mg by mouth 2 (two) times daily. 03/29/16   Historical Provider, MD  clopidogrel (PLAVIX) 75 MG tablet TAKE 1 TABLET BY MOUTH ONCE A DAY 04/24/16   Bartholomew Crews, MD  desloratadine (CLARINEX) 5 MG tablet Take 5 mg by mouth at bedtime as needed (allergies). Reported on 04/11/2015 04/10/13   Historical Provider, MD  fenofibrate 54 MG tablet  09/21/15   Historical Provider, MD  ferrous fumarate (HEMOCYTE - 106 MG FE) 325 (106 Fe) MG TABS tablet Take 1 tablet (106 mg of iron total) by mouth daily. 03/15/16   Lennis Marion Downer, MD  furosemide (LASIX) 20 MG tablet TAKE 1 TABLET BY MOUTH TWO  TIMES DAILY 04/24/16   Bartholomew Crews, MD  gabapentin (NEURONTIN) 300 MG capsule Take 1 capsule (300 mg total) by mouth 2 (two) times daily. 02/02/16   Juliet Rude, MD  hydrOXYzine (VISTARIL) 50 MG capsule TAKE ONE CAPSULE BY MOUTH ONCE DAILY AT BEDTIME AS NEEDED 05/22/16   Bartholomew Crews, MD  Insulin Detemir (LEVEMIR FLEXTOUCH) 100 UNIT/ML Pen Inject 36 UNITS SUBCUTANEOUSLY at the same time each day 06/12/16   Bartholomew Crews, MD  Insulin Pen Needle (B-D UF III MINI PEN NEEDLES) 31G X 5 MM MISC Use to inject insulin one time a day 06/12/16   Bartholomew Crews, MD  loperamide (IMODIUM) 2 MG capsule TAKE ONE CAPSULE BY MOUTH ONCE DAILY AS NEEDED FOR DIARRHEA OR LOOSE STOOLS 05/24/14   Bartholomew Crews, MD  losartan (COZAAR) 25 MG tablet Take 1 tablet (25 mg total) by mouth daily. 12/23/15   Bartholomew Crews, MD  nitroGLYCERIN (NITROSTAT) 0.4 MG  SL tablet Place 1 tablet (0.4 mg total) under the tongue every 5 (five) minutes as needed for chest pain. Patient not taking: Reported on 06/21/2016 08/13/14   Bartholomew Crews, MD  ONE TOUCH ULTRA TEST test strip USE TO CHECK BLOOD SUGAR 3  TO 4 TIMES DAILY 07/09/16   Bartholomew Crews, MD  potassium chloride (K-DUR) 10 MEQ tablet Take 2 tablets (20 mEq total) by mouth daily. 07/21/15   Bartholomew Crews, MD  predniSONE (DELTASONE) 10 MG tablet Take 1 tablet (10 mg total) by mouth daily with breakfast.  For acute gout flare 06/27/16   Bartholomew Crews, MD  repaglinide (PRANDIN) 2 MG tablet TAKE 1 TABLET BY MOUTH 3  TIMES A DAY BEFORE MEALS 04/24/16   Bartholomew Crews, MD  scopolamine (TRANSDERM-SCOP) 1 MG/3DAYS Place 1 patch (1.5 mg total) onto the skin every 3 (three) days. Apply at least 4 hrs prior to exposure Patient not taking: Reported on 06/21/2016 06/11/16   Bartholomew Crews, MD  ULORIC 40 MG tablet Take 1 tablet by mouth daily. 06/20/16   Historical Provider, MD    Family History Family History  Problem Relation Age of Onset  . Pancreatic cancer Brother 22  . Cancer Mother   . Breast cancer Mother 14  . Lung cancer Maternal Aunt   . Prostate cancer Maternal Uncle   . Heart attack Maternal Grandmother   . Kidney failure Maternal Aunt   . Hypertension Daughter     Social History Social History  Substance Use Topics  . Smoking status: Never Smoker  . Smokeless tobacco: Never Used  . Alcohol use No     Allergies   Percocet [oxycodone-acetaminophen]; Diazepam; Haloperidol lactate; Lorazepam; Morphine sulfate; Propoxyphene hcl; and Tramadol hcl   Review of Systems Review of Systems  Reason unable to perform ROS: Urgent need for intervention.     Physical Exam Updated Vital Signs BP (!) 149/72   Pulse 88   Resp 17   Ht 4' 7"  (1.397 m)   Wt 189 lb (85.7 kg)   SpO2 98%   BMI 43.93 kg/m   Physical Exam  Constitutional: She is oriented to person, place, and  time. She appears well-developed and well-nourished.  HENT:  Head: Normocephalic and atraumatic.  Eyes: Conjunctivae are normal.  Neck: Neck supple.  Cardiovascular: Normal rate and regular rhythm.   Pulmonary/Chest: Effort normal and breath sounds normal.  Abdominal:  General abdominal tenderness  Musculoskeletal: Normal range of motion.  Neurological: She is alert and oriented to person, place, and time.  Skin: Skin is warm and dry.  Psychiatric: She has a normal mood and affect. Her behavior is normal.  Nursing note and vitals reviewed.    ED Treatments / Results  Labs (all labs ordered are listed, but only abnormal results are displayed) Labs Reviewed  CBC WITH DIFFERENTIAL/PLATELET - Abnormal; Notable for the following:       Result Value   WBC 12.3 (*)    Hemoglobin 9.9 (*)    HCT 30.9 (*)    MCH 25.3 (*)    Neutro Abs 11.0 (*)    All other components within normal limits  COMPREHENSIVE METABOLIC PANEL - Abnormal; Notable for the following:    Chloride 100 (*)    Glucose, Bld 258 (*)    BUN 44 (*)    Creatinine, Ser 1.78 (*)    Total Protein 8.7 (*)    GFR calc non Af Amer 28 (*)    GFR calc Af Amer 32 (*)    All other components within normal limits  URINALYSIS, ROUTINE W REFLEX MICROSCOPIC - Abnormal; Notable for the following:    Color, Urine STRAW (*)    Glucose, UA 50 (*)    Protein, ur 30 (*)    Leukocytes, UA SMALL (*)    Squamous Epithelial / LPF 0-5 (*)    All other components within normal limits  LIPASE, BLOOD    EKG  EKG Interpretation  Date/Time:  Thursday Jul 19 2016 07:15:04 EDT Ventricular Rate:  90 PR Interval:  QRS Duration: 152 QT Interval:  409 QTC Calculation: 501 R Axis:   -27 Text Interpretation:  Sinus rhythm Right bundle branch block Baseline wander in lead(s) II III aVF Confirmed by Montoya Watkin  MD, Atreus Hasz ([id number]) on 07/19/2016 10:17:40 AM       Radiology Ct Abdomen Pelvis W Contrast  Result Date: 07/19/2016 CLINICAL DATA:   Midline lower abdominal pain, nausea, vomiting. EXAM: CT ABDOMEN AND PELVIS WITH CONTRAST TECHNIQUE: Multidetector CT imaging of the abdomen and pelvis was performed using the standard protocol following bolus administration of intravenous contrast. CONTRAST:  <See Chart> ISOVUE-300 IOPAMIDOL (ISOVUE-300) INJECTION 61% COMPARISON:  None. FINDINGS: Lower chest: Postoperative changes in the right breast. No acute abnormalities. Hepatobiliary: Diffuse low-density throughout the liver suggests mild fatty infiltration. No focal abnormality. Prior cholecystectomy. Pancreas:  No focal abnormality or ductal dilatation. Spleen: No focal abnormality.  Normal size. Adrenals/Urinary Tract: Bilateral renal cysts. No hydronephrosis. Adrenal glands and urinary bladder unremarkable. Stomach/Bowel: Postoperative changes in the right colon. Dilated small bowel loops in the abdomen and pelvis. Distal small bowel loops are decompressed. Findings concerning for distal small bowel obstruction. Exact transition is not visualized. Cause presumably adhesions. Large bowel and stomach grossly unremarkable. Vascular/Lymphatic: Aortic and iliac calcifications. No aneurysm. No adenopathy. Reproductive: Prior hysterectomy.  No adnexal masses. Other: No free fluid or free air. Musculoskeletal: No acute bony abnormality. IMPRESSION: Dilated proximal and mid small bowel loops with decompressed distal small bowel loops. Findings compatible with small bowel obstruction. Exact transition point not visualized. Suspect mild fatty infiltration of the liver. Electronically Signed   By: Rolm Baptise M.D.   On: 07/19/2016 10:12    Procedures Procedures (including critical care time)  Medications Ordered in ED Medications  sodium chloride 0.9 % bolus 1,000 mL (1,000 mLs Intravenous New Bag/Given 07/19/16 0820)  ondansetron (ZOFRAN) injection 4 mg (4 mg Intravenous Given 07/19/16 0823)  fentaNYL (SUBLIMAZE) injection 50 mcg (50 mcg Intravenous Given  07/19/16 0827)  ondansetron (ZOFRAN-ODT) 4 MG disintegrating tablet (4 mg  Given 07/19/16 0739)  iopamidol (ISOVUE-300) 61 % injection (75 mLs Intravenous Contrast Given 07/19/16 0946)  fentaNYL (SUBLIMAZE) injection 50 mcg (50 mcg Intravenous Given 07/19/16 2297)     Initial Impression / Assessment and Plan / ED Course  I have reviewed the triage vital signs and the nursing notes.  Pertinent labs & imaging results that were available during my care of the patient were reviewed by me and considered in my medical decision making (see chart for details).     Patient presents with abdominal pain and vomiting. She has had several small bowel obstructions in the past. CT scan confirms same. Will consult general surgery and admit to general medicine.  Final Clinical Impressions(s) / ED Diagnoses   Final diagnoses:  SBO (small bowel obstruction) ([place])    New Prescriptions New Prescriptions   No medications on file     Nat Christen, MD 07/19/16 1024

## 2016-07-19 NOTE — Consult Note (Signed)
Reason for Consult:  SBO Referring Physician: B Semaya [name] is an 71 y.o. female.  HPI: 71 y/o female who was brought to the ED this AM with abdominal pain, nausea and vomiting.  It started about 4 AM this morning.  She has has similar episodes in the past associated with SBO. She has a Hx of ileocecectomy for presumed Crohn's back around 2003. Path was negative for Crohn's.  She has had a hysterectomy, BSSO, right colon, cholecystectomy and Ventral hernia repair in the past.    Work up in the ED shows her BP was up some, but stable, she was afebrile,.  Glucose 258, creatinine is 1.78, which may be baseline for her. WBC is up, H/H is about the same as last year.  UA is unremarkable. CT scan abd and pelvis with contrast:  Fatty liver, Postoperative changes in the right colon. Dilated small bowel loops in the abdomen and pelvis. Distal small bowel loops are decompressed. Findings concerning for distal small bowel obstruction. Exact transition is not visualized. Cause presumably adhesions. Large bowel and stomach grossly unremarkable.  Right now she is not having pain or nausea, has not vomited for at least 1 hour, perhaps longer.  She does not want an NG tube if she can avoid it.  Last episode of SBO reported about 1-1.5 years ago, she is not sure.  We are ask to see.    Past Medical History:  Diagnosis Date  . Abdominal discomfort    Chronic N/V/D. Presumptive dx Crohn's dx per elevated p ANCA. Failed Entocort and Pentasa. Sep 2003 - ileocolectomy c anastomosis per Dr Deon Pilling 2/2 adhesions - path was hegative for Crohns. EGD, Sm bowel follow through (11/03), and an eteroclysis (10/03) were unrevealing. Cuases hypomag and hypocalcemia.  . Adnexal mass 8/03   s/p lap BSO (R ovarian fibroma) & lysis of adhesions  . Allergy    Seasonal  . Anemia    Multifactorial. Baseline HgB 10-11 ish. B12 def - 150 in 3/10. Fe Def - ferritin 35 3/10. Both are being repleted.  . Breast cancer ([place]) 03/16/13     right, 5 o'clock  . CAD (coronary artery disease) 1996   1996 - PTCA and angioplasty diagonal branch. 2000 - Rotoblator & angiopllasty of diagonal. 2006 - subendocardial AMI, DES to proximal LAD.Marland Kitchen Also had 90% stenosis in distal apical LAD. EF 55 with apical hypokinesis. Indefinite ASA and Plavix.  . CHF (congestive heart failure) ([place])   . Chronic kidney disease    Chronic renal insuff baseline Cr 1.2 - 1.4 ish.  . Chronic pain    CT 10/10 = Spinal stenosis L2 - S1.  . Diabetes mellitus    Insulin dependent  . Hx of radiation therapy 06/02/13- 07/16/13   right rbeast 4500 cGy 25 sessions, right breast boost 1600 cGy in 8 sessions  . Hyperlipidemia    Managed with both a statin and Welchol. Welchol stopped 2014 2/2 cost and started on fenofibrate   . Hypertension    2006 B renal arteries patent. 2003 MRA - no RAS. 2003 pheo W/U Dr Hassell Done reportedly negative.  . Lupus   . RBBB   . Stroke [facility])    Incidental finding MRI 2002 L lacunar infarct  . Wears dentures    top    Past Surgical History:  Procedure Laterality Date  . ABDOMINAL HYSTERECTOMY    . BILATERAL SALPINGOOPHORECTOMY  8/03   Lap BSO (R ovarian fibroma) and adhesion lysis  .  BOWEL RESECTION  2003   ileocolectomy with anastomosis 2/2 adhesions  . BREAST LUMPECTOMY WITH NEEDLE LOCALIZATION AND AXILLARY SENTINEL LYMPH NODE BX Right 04/22/2013   Procedure: BREAST LUMPECTOMY WITH NEEDLE LOCALIZATION AND AXILLARY SENTINEL LYMPH NODE BX;  Surgeon: Stark Klein, MD;  Location: [facility];  Service: General;  Laterality: Right;  . CARDIAC CATHETERIZATION     2 stents  . CHOLECYSTECTOMY    . HEMICOLECTOMY     R sided hemicolectomy  . HERNIA REPAIR     Ventral hernia repair  . PTCA  4/06    Family History  Problem Relation Age of Onset  . Pancreatic cancer Brother 70  . Cancer Mother   . Breast cancer Mother 52  . Lung cancer Maternal Aunt   . Prostate cancer Maternal Uncle   . Heart attack Maternal  Grandmother   . Kidney failure Maternal Aunt   . Hypertension Daughter     Social History:  reports that she has never smoked. She has never used smokeless tobacco. She reports that she does not drink alcohol or use drugs. ETOH:  None Tobacco:  None Drugs:  None Lives alone but works at [name]:  Allergies  Allergen Reactions  . Percocet [Oxycodone-Acetaminophen] Other (See Comments)    headaches  . Diazepam Other (See Comments)    REACTION: Agitation  . Haloperidol Lactate Nausea And Vomiting  . Lorazepam Nausea Only  . Morphine Sulfate Other (See Comments)    REACTION: Agitation  . Propoxyphene Hcl Nausea And Vomiting  . Tramadol Hcl Nausea And Vomiting   Prior to Admission medications   Medication Sig Start Date End Date Taking? Authorizing Provider  amLODipine (NORVASC) 10 MG tablet Take 1 tablet by mouth  daily 09/20/15   Bartholomew Crews, MD  anastrozole (ARIMIDEX) 1 MG tablet Take 1 tablet (1 mg total) by mouth daily. 06/21/16   Truitt Merle, MD  aspirin EC 81 MG tablet Take 81 mg by mouth daily.    Historical Provider, MD  atorvastatin (LIPITOR) 20 MG tablet Take 1 tablet by mouth at  bedtime 09/20/15   Bartholomew Crews, MD  carvedilol (COREG) 6.25 MG tablet Take 6.25 mg by mouth 2 (two) times daily. 03/29/16   Historical Provider, MD  clopidogrel (PLAVIX) 75 MG tablet TAKE 1 TABLET BY MOUTH ONCE A DAY 04/24/16   Bartholomew Crews, MD  desloratadine (CLARINEX) 5 MG tablet Take 5 mg by mouth at bedtime as needed (allergies). Reported on 04/11/2015 04/10/13   Historical Provider, MD  fenofibrate 54 MG tablet  09/21/15   Historical Provider, MD  ferrous fumarate (HEMOCYTE - 106 MG FE) 325 (106 Fe) MG TABS tablet Take 1 tablet (106 mg of iron total) by mouth daily. 03/15/16   Lennis Marion Downer, MD  furosemide (LASIX) 20 MG tablet TAKE 1 TABLET BY MOUTH TWO  TIMES DAILY 04/24/16   Bartholomew Crews, MD  gabapentin (NEURONTIN) 300 MG capsule Take 1 capsule (300 mg total) by  mouth 2 (two) times daily. 02/02/16   Juliet Rude, MD  hydrOXYzine (VISTARIL) 50 MG capsule TAKE ONE CAPSULE BY MOUTH ONCE DAILY AT BEDTIME AS NEEDED 05/22/16   Bartholomew Crews, MD  Insulin Detemir (LEVEMIR FLEXTOUCH) 100 UNIT/ML Pen Inject 36 UNITS SUBCUTANEOUSLY at the same time each day 06/12/16   Bartholomew Crews, MD  Insulin Pen Needle (B-D UF III MINI PEN NEEDLES) 31G X 5 MM MISC Use to inject insulin one time a day 06/12/16  Bartholomew Crews, MD  loperamide (IMODIUM) 2 MG capsule TAKE ONE CAPSULE BY MOUTH ONCE DAILY AS NEEDED FOR DIARRHEA OR LOOSE STOOLS 05/24/14   Bartholomew Crews, MD  losartan (COZAAR) 25 MG tablet Take 1 tablet (25 mg total) by mouth daily. 12/23/15   Bartholomew Crews, MD  nitroGLYCERIN (NITROSTAT) 0.4 MG SL tablet Place 1 tablet (0.4 mg total) under the tongue every 5 (five) minutes as needed for chest pain. Patient not taking: Reported on 06/21/2016 08/13/14   Bartholomew Crews, MD  ONE TOUCH ULTRA TEST test strip USE TO CHECK BLOOD SUGAR 3  TO 4 TIMES DAILY 07/09/16   Bartholomew Crews, MD  potassium chloride (K-DUR) 10 MEQ tablet Take 2 tablets (20 mEq total) by mouth daily. 07/21/15   Bartholomew Crews, MD  predniSONE (DELTASONE) 10 MG tablet Take 1 tablet (10 mg total) by mouth daily with breakfast. For acute gout flare 06/27/16   Bartholomew Crews, MD  repaglinide (PRANDIN) 2 MG tablet TAKE 1 TABLET BY MOUTH 3  TIMES A DAY BEFORE MEALS 04/24/16   Bartholomew Crews, MD  scopolamine (TRANSDERM-SCOP) 1 MG/3DAYS Place 1 patch (1.5 mg total) onto the skin every 3 (three) days. Apply at least 4 hrs prior to exposure Patient not taking: Reported on 06/21/2016 06/11/16   Bartholomew Crews, MD  ULORIC 40 MG tablet Take 1 tablet by mouth daily. 06/20/16   Historical Provider, MD     Results for orders placed or performed during the hospital encounter of 07/19/16 (from the past 48 hour(s))  CBC with Differential     Status: Abnormal   Collection Time: 07/19/16   8:08 AM  Result Value Ref Range   WBC 12.3 (H) 4.0 - 10.5 K/uL   RBC 3.91 3.87 - 5.11 MIL/uL   Hemoglobin 9.9 (L) 12.0 - 15.0 g/dL   HCT 30.9 (L) 36.0 - 46.0 %   MCV 79.0 78.0 - 100.0 fL   MCH 25.3 (L) 26.0 - 34.0 pg   MCHC 32.0 30.0 - 36.0 g/dL   RDW 15.1 11.5 - 15.5 %   Platelets 291 150 - 400 K/uL   Neutrophils Relative % 90 %   Neutro Abs 11.0 (H) 1.7 - 7.7 K/uL   Lymphocytes Relative 8 %   Lymphs Abs 1.0 0.7 - 4.0 K/uL   Monocytes Relative 2 %   Monocytes Absolute 0.2 0.1 - 1.0 K/uL   Eosinophils Relative 0 %   Eosinophils Absolute 0.0 0.0 - 0.7 K/uL   Basophils Relative 0 %   Basophils Absolute 0.0 0.0 - 0.1 K/uL  Comprehensive metabolic panel     Status: Abnormal   Collection Time: 07/19/16  8:08 AM  Result Value Ref Range   Sodium 137 135 - 145 mmol/L   Potassium 3.5 3.5 - 5.1 mmol/L   Chloride 100 (L) 101 - 111 mmol/L   CO2 25 22 - 32 mmol/L   Glucose, Bld 258 (H) 65 - 99 mg/dL   BUN 44 (H) 6 - 20 mg/dL   Creatinine, Ser 1.78 (H) 0.44 - 1.00 mg/dL   Calcium 10.2 8.9 - 10.3 mg/dL   Total Protein 8.7 (H) 6.5 - 8.1 g/dL   Albumin 3.9 3.5 - 5.0 g/dL   AST 25 15 - 41 U/L   ALT 24 14 - 54 U/L   Alkaline Phosphatase 60 38 - 126 U/L   Total Bilirubin 0.4 0.3 - 1.2 mg/dL   GFR calc non Af Amer 28 (L) >  60 mL/min   GFR calc Af Amer 32 (L) >60 mL/min    Comment: (NOTE) The eGFR has been calculated using the CKD EPI equation. This calculation has not been validated in all clinical situations. eGFR's persistently <60 mL/min signify possible Chronic Kidney Disease.    Anion gap 12 5 - 15  Lipase, blood     Status: None   Collection Time: 07/19/16  8:08 AM  Result Value Ref Range   Lipase 21 11 - 51 U/L  Urinalysis, Routine w reflex microscopic     Status: Abnormal   Collection Time: 07/19/16  8:20 AM  Result Value Ref Range   Color, Urine STRAW (A) YELLOW   APPearance CLEAR CLEAR   Specific Gravity, Urine 1.014 1.005 - 1.030   pH 6.0 5.0 - 8.0   Glucose, UA 50 (A)  NEGATIVE mg/dL   Hgb urine dipstick NEGATIVE NEGATIVE   Bilirubin Urine NEGATIVE NEGATIVE   Ketones, ur NEGATIVE NEGATIVE mg/dL   Protein, ur 30 (A) NEGATIVE mg/dL   Nitrite NEGATIVE NEGATIVE   Leukocytes, UA SMALL (A) NEGATIVE   RBC / HPF 0-5 0 - 5 RBC/hpf   WBC, UA 0-5 0 - 5 WBC/hpf   Bacteria, UA NONE SEEN NONE SEEN   Squamous Epithelial / LPF 0-5 (A) NONE SEEN    Ct Abdomen Pelvis W Contrast  Result Date: 07/19/2016 CLINICAL DATA:  Midline lower abdominal pain, nausea, vomiting. EXAM: CT ABDOMEN AND PELVIS WITH CONTRAST TECHNIQUE: Multidetector CT imaging of the abdomen and pelvis was performed using the standard protocol following bolus administration of intravenous contrast. CONTRAST:  <See Chart> ISOVUE-300 IOPAMIDOL (ISOVUE-300) INJECTION 61% COMPARISON:  None. FINDINGS: Lower chest: Postoperative changes in the right breast. No acute abnormalities. Hepatobiliary: Diffuse low-density throughout the liver suggests mild fatty infiltration. No focal abnormality. Prior cholecystectomy. Pancreas:  No focal abnormality or ductal dilatation. Spleen: No focal abnormality.  Normal size. Adrenals/Urinary Tract: Bilateral renal cysts. No hydronephrosis. Adrenal glands and urinary bladder unremarkable. Stomach/Bowel: Postoperative changes in the right colon. Dilated small bowel loops in the abdomen and pelvis. Distal small bowel loops are decompressed. Findings concerning for distal small bowel obstruction. Exact transition is not visualized. Cause presumably adhesions. Large bowel and stomach grossly unremarkable. Vascular/Lymphatic: Aortic and iliac calcifications. No aneurysm. No adenopathy. Reproductive: Prior hysterectomy.  No adnexal masses. Other: No free fluid or free air. Musculoskeletal: No acute bony abnormality. IMPRESSION: Dilated proximal and mid small bowel loops with decompressed distal small bowel loops. Findings compatible with small bowel obstruction. Exact transition point not  visualized. Suspect mild fatty infiltration of the liver. Electronically Signed   By: Rolm Baptise M.D.   On: 07/19/2016 10:12    Review of Systems  Constitutional: Negative.   HENT: Negative.        She has a runny nose  Eyes: Negative.   Respiratory: Negative for cough, hemoptysis, sputum production, shortness of breath and wheezing.   Cardiovascular: Positive for orthopnea, leg swelling and PND. Negative for chest pain, palpitations and claudication.  Gastrointestinal: Positive for abdominal pain, diarrhea (some Sunday 4/29, but none since, normal BM yesterday), nausea and vomiting. Negative for blood in stool, constipation and melena.  Genitourinary: Negative.   Musculoskeletal: Negative.   Skin: Negative.   Neurological: Negative.   Endo/Heme/Allergies: Negative.   Psychiatric/Behavioral: The patient is nervous/anxious.    Blood pressure (!) 149/72, pulse 88, resp. rate 17, height 4' 7" (1.397 m), weight 85.7 kg (189 lb), SpO2 98 %. Physical Exam  Constitutional: She  is oriented to person, place, and time. She appears well-developed and well-nourished.  Elderly BF no distress currently BP still up tachycardic.  No current nausea or pain after meds in ED.  She has not vomited for 1-2 hours.  Eyes: Right eye exhibits no discharge. Left eye exhibits discharge. Scleral icterus is present.  Pupils are equal  Neck: Normal range of motion. Neck supple. No JVD present. No tracheal deviation present. No thyromegaly present.  Cardiovascular: Regular rhythm, normal heart sounds and intact distal pulses.   No murmur heard. HR in 90's currently  Respiratory: Effort normal and breath sounds normal. No respiratory distress. She has no wheezes. She has no rales. She exhibits no tenderness.  GI: Soft. She exhibits distension. There is tenderness (more sore than tender right now.). There is no rebound and no guarding.    She has a few BS and is passing some flatus.   Musculoskeletal: She  exhibits edema (trace edema both lower legs).  Lymphadenopathy:    She has no cervical adenopathy.  Neurological: She is alert and oriented to person, place, and time. No cranial nerve deficit.  Skin: Skin is warm and dry. No rash noted. No erythema. No pallor.  Psychiatric: She has a normal mood and affect. Her behavior is normal. Judgment and thought content normal.    Assessment/Plan: Recurrent SBO Hx of multiple abdominal surgeries Hx of Right breast Ca with lumpectomy, node bx, and radiation rx CAD with DES stent LAD/AMI 2006; PTCA/rotoablation 1996/2000  - on Plavix Hx of CHF normal EF grade 1 diastolic dysfunction AODM Chronic kidney disease Chronic pain L1-2 AODM Hypertension Hx of CVA Hypertension URI vs allergies  Plan:  She is not having pain or nausea right now.  She really does not want an NG, she is having some flatus.  I would recommend bowel rest, NPO, IV hydration.  If she vomits again I would place an NG.  Keep her up walking as much as we can.  See if this resolves on it's own.  If no improvement by tomorrow AM, or if she vomits again Place and NG and put on LIWS.  I will order films in AM.  Keep K+ up around 4.0.  SBP if not better in AM.     , 07/19/2016, 10:40 AM

## 2016-07-19 NOTE — ED Notes (Signed)
IV attempted x 1 . No Success.

## 2016-07-19 NOTE — ED Triage Notes (Signed)
Pt arrives POV with c/o abdminal pain and vomiting. Episode started about 4 am. Hx o similar episodes in past with GI obstructions.

## 2016-07-20 ENCOUNTER — Observation Stay (HOSPITAL_COMMUNITY): Payer: Medicare Other

## 2016-07-20 DIAGNOSIS — I5032 Chronic diastolic (congestive) heart failure: Secondary | ICD-10-CM | POA: Diagnosis present

## 2016-07-20 DIAGNOSIS — E1140 Type 2 diabetes mellitus with diabetic neuropathy, unspecified: Secondary | ICD-10-CM | POA: Diagnosis present

## 2016-07-20 DIAGNOSIS — Z90722 Acquired absence of ovaries, bilateral: Secondary | ICD-10-CM | POA: Diagnosis not present

## 2016-07-20 DIAGNOSIS — G8929 Other chronic pain: Secondary | ICD-10-CM | POA: Diagnosis present

## 2016-07-20 DIAGNOSIS — N183 Chronic kidney disease, stage 3 (moderate): Secondary | ICD-10-CM | POA: Diagnosis present

## 2016-07-20 DIAGNOSIS — Z9049 Acquired absence of other specified parts of digestive tract: Secondary | ICD-10-CM | POA: Diagnosis not present

## 2016-07-20 DIAGNOSIS — Z794 Long term (current) use of insulin: Secondary | ICD-10-CM

## 2016-07-20 DIAGNOSIS — J301 Allergic rhinitis due to pollen: Secondary | ICD-10-CM | POA: Diagnosis present

## 2016-07-20 DIAGNOSIS — K56609 Unspecified intestinal obstruction, unspecified as to partial versus complete obstruction: Secondary | ICD-10-CM | POA: Diagnosis not present

## 2016-07-20 DIAGNOSIS — M329 Systemic lupus erythematosus, unspecified: Secondary | ICD-10-CM | POA: Diagnosis present

## 2016-07-20 DIAGNOSIS — Z980 Intestinal bypass and anastomosis status: Secondary | ICD-10-CM | POA: Diagnosis not present

## 2016-07-20 DIAGNOSIS — Z79899 Other long term (current) drug therapy: Secondary | ICD-10-CM | POA: Diagnosis not present

## 2016-07-20 DIAGNOSIS — Z8673 Personal history of transient ischemic attack (TIA), and cerebral infarction without residual deficits: Secondary | ICD-10-CM | POA: Diagnosis not present

## 2016-07-20 DIAGNOSIS — I2510 Atherosclerotic heart disease of native coronary artery without angina pectoris: Secondary | ICD-10-CM | POA: Diagnosis present

## 2016-07-20 DIAGNOSIS — M109 Gout, unspecified: Secondary | ICD-10-CM | POA: Diagnosis present

## 2016-07-20 DIAGNOSIS — R109 Unspecified abdominal pain: Secondary | ICD-10-CM | POA: Diagnosis not present

## 2016-07-20 DIAGNOSIS — K56600 Partial intestinal obstruction, unspecified as to cause: Principal | ICD-10-CM

## 2016-07-20 DIAGNOSIS — I252 Old myocardial infarction: Secondary | ICD-10-CM | POA: Diagnosis not present

## 2016-07-20 DIAGNOSIS — E119 Type 2 diabetes mellitus without complications: Secondary | ICD-10-CM

## 2016-07-20 DIAGNOSIS — I4510 Unspecified right bundle-branch block: Secondary | ICD-10-CM | POA: Diagnosis present

## 2016-07-20 DIAGNOSIS — Z888 Allergy status to other drugs, medicaments and biological substances status: Secondary | ICD-10-CM | POA: Diagnosis not present

## 2016-07-20 DIAGNOSIS — Z885 Allergy status to narcotic agent status: Secondary | ICD-10-CM | POA: Diagnosis not present

## 2016-07-20 DIAGNOSIS — I10 Essential (primary) hypertension: Secondary | ICD-10-CM | POA: Diagnosis not present

## 2016-07-20 DIAGNOSIS — Z923 Personal history of irradiation: Secondary | ICD-10-CM | POA: Diagnosis not present

## 2016-07-20 DIAGNOSIS — D539 Nutritional anemia, unspecified: Secondary | ICD-10-CM | POA: Diagnosis present

## 2016-07-20 DIAGNOSIS — E1122 Type 2 diabetes mellitus with diabetic chronic kidney disease: Secondary | ICD-10-CM | POA: Diagnosis present

## 2016-07-20 DIAGNOSIS — N2581 Secondary hyperparathyroidism of renal origin: Secondary | ICD-10-CM | POA: Diagnosis present

## 2016-07-20 DIAGNOSIS — E785 Hyperlipidemia, unspecified: Secondary | ICD-10-CM | POA: Diagnosis present

## 2016-07-20 DIAGNOSIS — I130 Hypertensive heart and chronic kidney disease with heart failure and stage 1 through stage 4 chronic kidney disease, or unspecified chronic kidney disease: Secondary | ICD-10-CM | POA: Diagnosis present

## 2016-07-20 DIAGNOSIS — Z853 Personal history of malignant neoplasm of breast: Secondary | ICD-10-CM | POA: Diagnosis not present

## 2016-07-20 LAB — GLUCOSE, CAPILLARY
GLUCOSE-CAPILLARY: 127 mg/dL — AB (ref 65–99)
GLUCOSE-CAPILLARY: 153 mg/dL — AB (ref 65–99)
Glucose-Capillary: 125 mg/dL — ABNORMAL HIGH (ref 65–99)
Glucose-Capillary: 126 mg/dL — ABNORMAL HIGH (ref 65–99)
Glucose-Capillary: 133 mg/dL — ABNORMAL HIGH (ref 65–99)
Glucose-Capillary: 91 mg/dL (ref 65–99)

## 2016-07-20 LAB — CBC
HEMATOCRIT: 27.3 % — AB (ref 36.0–46.0)
HEMOGLOBIN: 8.6 g/dL — AB (ref 12.0–15.0)
MCH: 25.1 pg — ABNORMAL LOW (ref 26.0–34.0)
MCHC: 31.5 g/dL (ref 30.0–36.0)
MCV: 79.6 fL (ref 78.0–100.0)
Platelets: 301 10*3/uL (ref 150–400)
RBC: 3.43 MIL/uL — ABNORMAL LOW (ref 3.87–5.11)
RDW: 15 % (ref 11.5–15.5)
WBC: 7.7 10*3/uL (ref 4.0–10.5)

## 2016-07-20 LAB — PHOSPHORUS: PHOSPHORUS: 2.5 mg/dL (ref 2.5–4.6)

## 2016-07-20 LAB — BASIC METABOLIC PANEL
ANION GAP: 10 (ref 5–15)
BUN: 29 mg/dL — ABNORMAL HIGH (ref 6–20)
CHLORIDE: 105 mmol/L (ref 101–111)
CO2: 26 mmol/L (ref 22–32)
Calcium: 9.2 mg/dL (ref 8.9–10.3)
Creatinine, Ser: 1.4 mg/dL — ABNORMAL HIGH (ref 0.44–1.00)
GFR calc Af Amer: 43 mL/min — ABNORMAL LOW (ref >60)
GFR, EST NON AFRICAN AMERICAN: 37 mL/min — AB (ref >60)
GLUCOSE: 134 mg/dL — AB (ref 65–99)
POTASSIUM: 3.8 mmol/L (ref 3.5–5.1)
Sodium: 141 mmol/L (ref 135–145)

## 2016-07-20 LAB — MAGNESIUM: Magnesium: 1.7 mg/dL (ref 1.7–2.4)

## 2016-07-20 MED ORDER — AMLODIPINE BESYLATE 10 MG PO TABS
10.0000 mg | ORAL_TABLET | Freq: Every day | ORAL | Status: DC
  Administered 2016-07-21: 10 mg via ORAL
  Filled 2016-07-20: qty 1

## 2016-07-20 NOTE — Progress Notes (Signed)
[facility] Progress Note     Subjective: CC: recurrent SBO Denies abdominal pain and has not required pain medication since yesterday afternoon. Denies nausea or vomiting. Having flatus. Reports a small, loose BM yesterday evening.  Objective: Vital signs in last 24 hours: Temp:  [97.8 F (36.6 C)-98.9 F (37.2 C)] 97.8 F (36.6 C) (05/04 0441) Pulse Rate:  [77-95] 77 (05/04 0441) Resp:  [9-18] 18 (05/04 0441) BP: (118-189)/(54-99) 133/54 (05/04 0441) SpO2:  [91 %-100 %] 95 % (05/04 0441) Weight:  [84.4 kg (186 lb 1.1 oz)-84.9 kg (187 lb 2.7 oz)] 84.4 kg (186 lb 1.1 oz) (05/04 0500) Last BM Date: 07/19/16  Intake/Output from previous day: 05/03 0701 - 05/04 0700 In: 1469.2 [I.V.:469.2; IV Piggyback:1000] Out: 2 [Urine:1; Stool:1] Intake/Output this shift: No intake/output data recorded.  PE: Gen:  Alert, NAD, pleasant Card:  Regular rate and rhythm, pedal pulses 2+ BL Pulm:  Normal effort, clear to auscultation bilaterally Abd: Soft, non-tender, mild distention, bowel sounds present, previous surgical scars noted Skin: warm and dry, no rashes  Psych: A&Ox3   Lab Results:   Recent Labs  07/19/16 0808 07/20/16 0334  WBC 12.3* 7.7  HGB 9.9* 8.6*  HCT 30.9* 27.3*  PLT 291 301   BMET  Recent Labs  07/19/16 0808 07/20/16 0334  NA 137 141  K 3.5 3.8  CL 100* 105  CO2 25 26  GLUCOSE 258* 134*  BUN 44* 29*  CREATININE 1.78* 1.40*  CALCIUM 10.2 9.2   PT/INR No results for input(s): LABPROT, INR in the last 72 hours. CMP     Component Value Date/Time   NA 141 07/20/2016 0334   NA 137 06/21/2016 1234   K 3.8 07/20/2016 0334   K 4.1 06/21/2016 1234   CL 105 07/20/2016 0334   CO2 26 07/20/2016 0334   CO2 26 06/21/2016 1234   GLUCOSE 134 (H) 07/20/2016 0334   GLUCOSE 148 (H) 06/21/2016 1234   BUN 29 (H) 07/20/2016 0334   BUN 46.3 (H) 06/21/2016 1234   CREATININE 1.40 (H) 07/20/2016 0334   CREATININE 1.8 (H) 06/21/2016 1234   CALCIUM 9.2  07/20/2016 0334   CALCIUM 10.1 06/21/2016 1234   PROT 8.7 (H) 07/19/2016 0808   PROT 8.9 (H) 06/21/2016 1234   ALBUMIN 3.9 07/19/2016 0808   ALBUMIN 3.9 06/21/2016 1234   AST 25 07/19/2016 0808   AST 13 06/21/2016 1234   ALT 24 07/19/2016 0808   ALT 15 06/21/2016 1234   ALKPHOS 60 07/19/2016 0808   ALKPHOS 72 06/21/2016 1234   BILITOT 0.4 07/19/2016 0808   BILITOT 0.28 06/21/2016 1234   GFRNONAA 37 (L) 07/20/2016 0334   GFRNONAA 45 (L) 12/17/2013 1158   GFRAA 43 (L) 07/20/2016 0334   GFRAA 52 (L) 12/17/2013 1158   Lipase     Component Value Date/Time   LIPASE 21 07/19/2016 9798   Studies/Results: Ct Abdomen Pelvis W Contrast  Result Date: 07/19/2016 CLINICAL DATA:  Midline lower abdominal pain, nausea, vomiting. EXAM: CT ABDOMEN AND PELVIS WITH CONTRAST TECHNIQUE: Multidetector CT imaging of the abdomen and pelvis was performed using the standard protocol following bolus administration of intravenous contrast. CONTRAST:  <See Chart> ISOVUE-300 IOPAMIDOL (ISOVUE-300) INJECTION 61% COMPARISON:  None. FINDINGS: Lower chest: Postoperative changes in the right breast. No acute abnormalities. Hepatobiliary: Diffuse low-density throughout the liver suggests mild fatty infiltration. No focal abnormality. Prior cholecystectomy. Pancreas:  No focal abnormality or ductal dilatation. Spleen: No focal abnormality.  Normal size. Adrenals/Urinary Tract: Bilateral  renal cysts. No hydronephrosis. Adrenal glands and urinary bladder unremarkable. Stomach/Bowel: Postoperative changes in the right colon. Dilated small bowel loops in the abdomen and pelvis. Distal small bowel loops are decompressed. Findings concerning for distal small bowel obstruction. Exact transition is not visualized. Cause presumably adhesions. Large bowel and stomach grossly unremarkable. Vascular/Lymphatic: Aortic and iliac calcifications. No aneurysm. No adenopathy. Reproductive: Prior hysterectomy.  No adnexal masses. Other: No free  fluid or free air. Musculoskeletal: No acute bony abnormality. IMPRESSION: Dilated proximal and mid small bowel loops with decompressed distal small bowel loops. Findings compatible with small bowel obstruction. Exact transition point not visualized. Suspect mild fatty infiltration of the liver. Electronically Signed   By: Rolm Baptise M.D.   On: 07/19/2016 10:12    Anti-infectives: Anti-infectives    None     Assessment/Plan Recurrent SBO - Hx of multiple abdominal surgeries - no further nausea/vomiting and having flatus/BM - Repeat AXR pending, will follow - recommend starting clear liquid diet with advancement to SOFT diet as tolerated.  - OK to resume anticoagulation prior to discharge if obstructive sxs do not occur with initiation of diet.  Hx of Right breast Ca with lumpectomy, node bx, and radiation rx CAD with DES stent LAD/AMI 2006; PTCA/rotoablation 1996/2000  - on Plavix, held  Hx of CHF normal EF grade 1 diastolic dysfunction AODM Chronic kidney disease Chronic pain L1-2 AODM Hypertension Hx of CVA Hypertension URI vs allergies  Plan: follow abdominal film, advance diet   LOS: 0 days    Jill Alexanders , [facility] 07/20/2016, 7:32 AM Pager: [phone] Consults: [phone] Mon-Fri 7:00 am-4:30 pm Sat-Sun 7:00 am-11:30 am

## 2016-07-20 NOTE — Progress Notes (Signed)
Internal Medicine Attending:   I saw and examined the patient. I reviewed the resident's note and I agree with the resident's findings and plan as documented in the resident's note.  Hospital day #2 with small bowel obstruction likely due to mechanical adhesions from prior abdominal surgery. She is improving very nicely with supportive care. she reports pain is well controlled and she is hungry. Abdominal exam is reassuring with no peritoneal signs. I think she is ready to advance to a clear liquid diet today. Maybe ready for soft foods tomorrow if she does well today.

## 2016-07-20 NOTE — Progress Notes (Signed)
   Subjective: Seen and evaluated today at bedside. Daughter present. Abdominal pain improved and nearly resolved, just complains of some "soreness." Denies any N/V. Had 1 small BM last night. Continues to pass gas. Somewhat hungry.  Objective:  Vital signs in last 24 hours: Vitals:   07/19/16 2054 07/20/16 0441 07/20/16 0500 07/20/16 1001  BP: (!) 146/55 (!) 133/54  (!) 155/63  Pulse: 83 77  76  Resp: 17 18    Temp: 98.9 F (37.2 C) 97.8 F (36.6 C)    TempSrc: Oral Oral    SpO2: 97% 95%    Weight:   186 lb 1.1 oz (84.4 kg)   Height:       General: In no acute distress. Resting comfortably in bed.  HENT: No conjunctival injection, icterus or ptosis. Oropharynx clear, mucous membranes moist.  Cardiovascular: Regular rate and rhythm. No murmur or rub appreciated. Pulmonary: CTA BL. Normal WOB.  Abdomen: Soft, non-tender and non-distended. +bowel sounds, still a little quiet.  Skin: Warm, dry. No cyanosis. No peripheral edema. Psych: Mood normal and affect was mood congruent. Responds to questions appropriately.  Assessment/Plan:  Active Problems:   Small bowel obstruction (HCC)  Partial Small Bowel Obstruction: In setting of several abdominal surgeries including ileocolectomy. Improving clinically with bowel rest and had small BM last night. Continues to pass gas. Abdominal pain nearly resolved. No N/v. Leukocytosis resolved and afebrile. Feels ready to advance diet. KUB with gas and stool in colon. -Advance diet to clears -Likely can advance to soft if pt continues to improve tomorrow -Consider adding back some PO medications if pt tolerates diet -Continue monitoring abdominal exams, if abd pain were to return + N/V --> place NGT -Gen surg on board  Type 2 Diabetes: On Levemir 15units + sliding scale here. Consider increasing basal if hyperglycemic with diet advancement.   HTN: On Amlodipine, coreg, losartan at home. Holding as pt NPO and pressures OK. Consider adding back PO  BP meds if pt tolerates diet. -labetalol PRN until able to tol PO  Dispo: Anticipated discharge in approximately 2-3 day(s).   Deniel Mcquiston, DO 07/20/2016, 12:33 PM Pager: [phone]

## 2016-07-21 DIAGNOSIS — Z885 Allergy status to narcotic agent status: Secondary | ICD-10-CM

## 2016-07-21 DIAGNOSIS — Z888 Allergy status to other drugs, medicaments and biological substances status: Secondary | ICD-10-CM

## 2016-07-21 DIAGNOSIS — K56609 Unspecified intestinal obstruction, unspecified as to partial versus complete obstruction: Secondary | ICD-10-CM

## 2016-07-21 LAB — GLUCOSE, CAPILLARY
GLUCOSE-CAPILLARY: 137 mg/dL — AB (ref 65–99)
GLUCOSE-CAPILLARY: 151 mg/dL — AB (ref 65–99)
Glucose-Capillary: 113 mg/dL — ABNORMAL HIGH (ref 65–99)
Glucose-Capillary: 139 mg/dL — ABNORMAL HIGH (ref 65–99)
Glucose-Capillary: 142 mg/dL — ABNORMAL HIGH (ref 65–99)

## 2016-07-21 NOTE — Progress Notes (Signed)
Pt discharged going home health teachings given, explained the meds and understood, how to check the fingerstick, removed the peripheral IV , no complained of abdominal pain nor nausea /vomiting after eating her lunch and dinner, informed about the next appointment, escorted by Nurse tech via wheelchair, assisted by her sister via private car, no s/s of distress noted.

## 2016-07-21 NOTE — Progress Notes (Signed)
CC:  Abdominal pain     Subjective: Reports having diarrhea all last PM.  ABD sore, but no other complaints.    Objective: Vital signs in last 24 hours: Temp:  [98.3 F (36.8 C)-98.9 F (37.2 C)] 98.9 F (37.2 C) (05/05 0428) Pulse Rate:  [76-84] 81 (05/05 0428) Resp:  [17-18] 17 (05/05 0428) BP: (139-159)/(63-68) 155/68 (05/05 0428) SpO2:  [97 %-100 %] 100 % (05/05 0428) Weight:  [83.4 kg (183 lb 13.8 oz)] 83.4 kg (183 lb 13.8 oz) (05/05 0428) Last BM Date: 07/20/16 180 PO recorded 500 IV Urine 400 recorded BM x 6 Afebrile, VSS Creatinine 1.4 yesteday NO labs or film this AM  Intake/Output from previous day: 05/04 0701 - 05/05 0700 In: 677.5 [P.O.:180; I.V.:497.5] Out: 400 [Urine:400] Intake/Output this shift: No intake/output data recorded.  General appearance: alert, cooperative and no distress GI: soft, sore, + BS, +BM x 6  Lab Results:   Recent Labs  07/19/16 0808 07/20/16 0334  WBC 12.3* 7.7  HGB 9.9* 8.6*  HCT 30.9* 27.3*  PLT 291 301    BMET  Recent Labs  07/19/16 0808 07/20/16 0334  NA 137 141  K 3.5 3.8  CL 100* 105  CO2 25 26  GLUCOSE 258* 134*  BUN 44* 29*  CREATININE 1.78* 1.40*  CALCIUM 10.2 9.2   PT/INR No results for input(s): LABPROT, INR in the last 72 hours.   Recent Labs Lab 07/19/16 0808  AST 25  ALT 24  ALKPHOS 60  BILITOT 0.4  PROT 8.7*  ALBUMIN 3.9     Lipase     Component Value Date/Time   LIPASE 21 07/19/2016 5366     Studies/Results: Ct Abdomen Pelvis W Contrast  Result Date: 07/19/2016 CLINICAL DATA:  Midline lower abdominal pain, nausea, vomiting. EXAM: CT ABDOMEN AND PELVIS WITH CONTRAST TECHNIQUE: Multidetector CT imaging of the abdomen and pelvis was performed using the standard protocol following bolus administration of intravenous contrast. CONTRAST:  <See Chart> ISOVUE-300 IOPAMIDOL (ISOVUE-300) INJECTION 61% COMPARISON:  None. FINDINGS: Lower chest: Postoperative changes in the right breast. No  acute abnormalities. Hepatobiliary: Diffuse low-density throughout the liver suggests mild fatty infiltration. No focal abnormality. Prior cholecystectomy. Pancreas:  No focal abnormality or ductal dilatation. Spleen: No focal abnormality.  Normal size. Adrenals/Urinary Tract: Bilateral renal cysts. No hydronephrosis. Adrenal glands and urinary bladder unremarkable. Stomach/Bowel: Postoperative changes in the right colon. Dilated small bowel loops in the abdomen and pelvis. Distal small bowel loops are decompressed. Findings concerning for distal small bowel obstruction. Exact transition is not visualized. Cause presumably adhesions. Large bowel and stomach grossly unremarkable. Vascular/Lymphatic: Aortic and iliac calcifications. No aneurysm. No adenopathy. Reproductive: Prior hysterectomy.  No adnexal masses. Other: No free fluid or free air. Musculoskeletal: No acute bony abnormality. IMPRESSION: Dilated proximal and mid small bowel loops with decompressed distal small bowel loops. Findings compatible with small bowel obstruction. Exact transition point not visualized. Suspect mild fatty infiltration of the liver. Electronically Signed   By: Rolm Baptise M.D.   On: 07/19/2016 10:12   Dg Abd 2 Views  Result Date: 07/20/2016 CLINICAL DATA:  Abdominal tenderness today, small-bowel obstruction, followup EXAM: ABDOMEN - 2 VIEW COMPARISON:  CT abdomen and pelvis 07/19/2016 FINDINGS: Surgical clips RIGHT upper quadrant corresponding to cholecystectomy. Small amount gas and stool throughout colon. Persistent dilatation of a small bowel loop in the RIGHT mid abdomen. No bowel wall thickening or free intraperitoneal air. Osseous structures unremarkable. No definite urinary tract calcification. IMPRESSION: Persistent dilatation of  a small bowel loop in RIGHT mid abdomen though gas and stool are present in the colon. Findings remain consistent with small bowel obstruction as noted on prior CT. Electronically Signed   By:  Lavonia Dana M.D.   On: 07/20/2016 08:48   Prior to Admission medications   Medication Sig Start Date End Date Taking? Authorizing Provider  acetaminophen (TYLENOL) 325 MG tablet Take 650 mg by mouth every 6 (six) hours as needed for mild pain.   Yes [provider]  amLODipine (NORVASC) 10 MG tablet Take 1 tablet by mouth  daily 09/20/15  Yes Bartholomew Crews, MD  anastrozole (ARIMIDEX) 1 MG tablet Take 1 tablet (1 mg total) by mouth daily. 06/21/16  Yes Truitt Merle, MD  aspirin EC 81 MG tablet Take 81 mg by mouth daily.   Yes [provider]  atorvastatin (LIPITOR) 20 MG tablet Take 1 tablet by mouth at  bedtime 09/20/15  Yes Bartholomew Crews, MD  carvedilol (COREG) 6.25 MG tablet Take 6.25 mg by mouth 2 (two) times daily. 03/29/16  Yes [provider]  clopidogrel (PLAVIX) 75 MG tablet TAKE 1 TABLET BY MOUTH ONCE A DAY 04/24/16  Yes Bartholomew Crews, MD  desloratadine (CLARINEX) 5 MG tablet Take 5 mg by mouth at bedtime as needed (allergies). Reported on 04/11/2015 04/10/13  Yes [provider]  fenofibrate 54 MG tablet Take 54 mg by mouth daily.  09/21/15  Yes [provider]  ferrous fumarate (HEMOCYTE - 106 MG FE) 325 (106 Fe) MG TABS tablet Take 1 tablet (106 mg of iron total) by mouth daily. 03/15/16  Yes Livesay, Lennis P, MD  furosemide (LASIX) 20 MG tablet TAKE 1 TABLET BY MOUTH TWO  TIMES DAILY 04/24/16  Yes Bartholomew Crews, MD  gabapentin (NEURONTIN) 300 MG capsule Take 1 capsule (300 mg total) by mouth 2 (two) times daily. 02/02/16  Yes Rivet, Sindy Guadeloupe, MD  hydrOXYzine (VISTARIL) 50 MG capsule TAKE ONE CAPSULE BY MOUTH ONCE DAILY AT BEDTIME AS NEEDED 05/22/16  Yes Bartholomew Crews, MD  Insulin Detemir (LEVEMIR FLEXTOUCH) 100 UNIT/ML Pen Inject 36 UNITS SUBCUTANEOUSLY at the same time each day 06/12/16  Yes Bartholomew Crews, MD  Insulin Pen Needle (B-D UF III MINI PEN NEEDLES) 31G X 5 MM MISC Use to inject insulin one time a day 06/12/16   Yes Bartholomew Crews, MD  loperamide (IMODIUM) 2 MG capsule TAKE ONE CAPSULE BY MOUTH ONCE DAILY AS NEEDED FOR DIARRHEA OR LOOSE STOOLS 05/24/14  Yes Bartholomew Crews, MD  losartan (COZAAR) 25 MG tablet Take 1 tablet (25 mg total) by mouth daily. 12/23/15  Yes Bartholomew Crews, MD  nitroGLYCERIN (NITROSTAT) 0.4 MG SL tablet Place 1 tablet (0.4 mg total) under the tongue every 5 (five) minutes as needed for chest pain. 08/13/14  Yes Bartholomew Crews, MD  ONE TOUCH ULTRA TEST test strip USE TO CHECK BLOOD SUGAR 3  TO 4 TIMES DAILY 07/09/16  Yes Bartholomew Crews, MD  potassium chloride (K-DUR) 10 MEQ tablet Take 2 tablets (20 mEq total) by mouth daily. 07/21/15  Yes Bartholomew Crews, MD  repaglinide (PRANDIN) 2 MG tablet TAKE 1 TABLET BY MOUTH 3  TIMES A DAY BEFORE MEALS 04/24/16  Yes Bartholomew Crews, MD  ULORIC 40 MG tablet Take 1 tablet by mouth daily. 06/20/16  Yes [provider]    Medications: . amLODipine  10 mg Oral Daily  . aspirin EC  81 mg  Oral Daily  . heparin  5,000 Units Subcutaneous Q8H  . insulin aspart  0-9 Units Subcutaneous Q4H  . insulin detemir  15 Units Subcutaneous Q2200   . dextrose 5 % and 0.45 % NaCl with KCl 20 mEq/L 50 mL/hr at 07/20/16 1548    Assessment/Plan Recurrent SBO Hx of multiple abdominal surgeries Hx of Right breast Ca with lumpectomy, node bx, and radiation rx CAD with DES stent LAD/AMI 2006; PTCA/rotoablation 1996/2000  - on Plavix Hx of CHF normal EF grade 1 diastolic dysfunction AODM Chronic kidney disease Anemia  Chronic pain L1-2 AODM Hypertension Hx of CVA Hypertension URI vs allergies FEN:  IV fluids/Clears ID:  None DVT:  Heparin   Plan:  Full liquids and advance as tolerated.  If she does well she can go home on full liquids.     LOS: 1 day    Clester Chlebowski 07/21/2016 [phone]

## 2016-07-21 NOTE — Progress Notes (Signed)
  Date: 07/21/2016  Patient name: [name]  Medical record number: [id number]  Date of birth: Mar 03, 1946   I have seen and evaluated this patient and I have discussed the plan of care with the house staff. Please see their note for complete details. I concur with their findings with the following additions/corrections: Tolerating softs without increase in pain. Plan for D/C today.  Bartholomew Crews, MD 07/21/2016, 3:41 PM

## 2016-07-21 NOTE — Progress Notes (Signed)
   Subjective: Starts is resting comfortably in chair this morning and tolerated some oral intake. She had multiple bowel movements yesterday PM with some soreness afterwards. She is feeling good without any nausea at this time.  Objective:  Vital signs in last 24 hours: Vitals:   07/20/16 1001 07/20/16 1409 07/20/16 2024 07/21/16 0428  BP: (!) 155/63 139/68 (!) 159/64 (!) 155/68  Pulse: 76 84 76 81  Resp:   18 17  Temp:  98.6 F (37 C) 98.3 F (36.8 C) 98.9 F (37.2 C)  TempSrc:  Oral Oral Oral  SpO2:  97% 100% 100%  Weight:    183 lb 13.8 oz (83.4 kg)  Height:       General: In no acute distress. Sitting comfortably at bedside chair Cardiovascular: RRR. No murmur or rub appreciated Pulmonary: CTAB Abdomen: Soft, non-tender and non-distended. +bowel sounds Skin: Warm, dry. No cyanosis. No peripheral edema  Assessment/Plan: #Partial Small Bowel Obstruction Partial small bowel obstruction most likely related to multiple past abdominal surgeries that is improving very well with supportive measures alone. She tolerated a clear liquid diet yesterday and we are advancing to a full liquid diet today. If she tolerates this well could probably return home tonight or tomorrow morning. She was eager for hospital discharge whenever deemed safe. General surgery recommendations have been appreciated during this hospitalization.  #HTN Slightly hypertensive today secondary to withholding her oral antihypertensive medications including amlodipine, losartan, carvedilol. These can be restarted now if she is tolerating oral intake without difficulty.  Dispo: Anticipate discharge later today if tolerating full liquid diet  Collier Salina, MD PGY-II Internal Medicine Resident Pager# [phone] 07/21/2016, 12:44 PM

## 2016-07-21 NOTE — Discharge Summary (Signed)
Name: [name] MRN: [id number] DOB: 12-Aug-1945 71 y.o. PCP: Katie Crews, MD  Date of Admission: 07/19/2016  7:03 AM Date of Discharge: 07/23/2016 Attending Physician: Vevelyn Pat, M.D.  Discharge Diagnosis: Active Problems:   Small bowel obstruction [facility])   Discharge Medications: Allergies as of 07/21/2016      Reactions   Percocet [oxycodone-acetaminophen] Other (See Comments)   headaches   Diazepam Other (See Comments)   REACTION: Agitation   Haloperidol Lactate Nausea And Vomiting   Lorazepam Nausea Only   Morphine Sulfate Other (See Comments)   REACTION: Agitation   Propoxyphene Hcl Nausea And Vomiting   Tramadol Hcl Nausea And Vomiting      Medication List    TAKE these medications   acetaminophen 325 MG tablet Commonly known as:  TYLENOL Take 650 mg by mouth every 6 (six) hours as needed for mild pain. Notes to patient:  If your pain scale 1-5 take tylenol 325 mg if needed   amLODipine 10 MG tablet Commonly known as:  NORVASC Take 1 tablet by mouth  daily   anastrozole 1 MG tablet Commonly known as:  ARIMIDEX Take 1 tablet (1 mg total) by mouth daily.   aspirin EC 81 MG tablet Take 81 mg by mouth daily.   atorvastatin 20 MG tablet Commonly known as:  LIPITOR Take 1 tablet by mouth at  bedtime   carvedilol 6.25 MG tablet Commonly known as:  COREG Take 6.25 mg by mouth 2 (two) times daily.   clopidogrel 75 MG tablet Commonly known as:  PLAVIX TAKE 1 TABLET BY MOUTH ONCE A DAY   desloratadine 5 MG tablet Commonly known as:  CLARINEX Take 5 mg by mouth at bedtime as needed (allergies). Reported on 04/11/2015   fenofibrate 54 MG tablet Take 54 mg by mouth daily.   ferrous fumarate 325 (106 Fe) MG Tabs tablet Commonly known as:  HEMOCYTE - 106 mg FE Take 1 tablet (106 mg of iron total) by mouth daily.   furosemide 20 MG tablet Commonly known as:  LASIX TAKE 1 TABLET BY MOUTH TWO  TIMES DAILY   gabapentin 300 MG  capsule Commonly known as:  NEURONTIN Take 1 capsule (300 mg total) by mouth 2 (two) times daily.   hydrOXYzine 50 MG capsule Commonly known as:  VISTARIL TAKE ONE CAPSULE BY MOUTH ONCE DAILY AT BEDTIME AS NEEDED   Insulin Detemir 100 UNIT/ML Pen Commonly known as:  LEVEMIR FLEXTOUCH Inject 36 UNITS SUBCUTANEOUSLY at the same time each day Notes to patient:  pls check your fingerstick prior   Insulin Pen Needle 31G X 5 MM Misc Commonly known as:  B-D UF III MINI PEN NEEDLES Use to inject insulin one time a day Notes to patient:  Check your fingerstick with corresponding insulin scaling scale   loperamide 2 MG capsule Commonly known as:  IMODIUM TAKE ONE CAPSULE BY MOUTH ONCE DAILY AS NEEDED FOR DIARRHEA OR LOOSE STOOLS Notes to patient:  Take if needed for loose stool   losartan 25 MG tablet Commonly known as:  COZAAR Take 1 tablet (25 mg total) by mouth daily.   nitroGLYCERIN 0.4 MG SL tablet Commonly known as:  NITROSTAT Place 1 tablet (0.4 mg total) under the tongue every 5 (five) minutes as needed for chest pain. Notes to patient:  If you have chest pain 5 min interval if chest pain doesn't relieve for 3x   ONE TOUCH ULTRA TEST test strip Generic drug:  glucose blood USE  TO CHECK BLOOD SUGAR 3  TO 4 TIMES DAILY Notes to patient:  Check fingerstick 3 - 4 x / day prior to meal and at bedtime   potassium chloride 10 MEQ tablet Commonly known as:  K-DUR Take 2 tablets (20 mEq total) by mouth daily.   repaglinide 2 MG tablet Commonly known as:  PRANDIN TAKE 1 TABLET BY MOUTH 3  TIMES A DAY BEFORE MEALS   ULORIC 40 MG tablet Generic drug:  febuxostat Take 1 tablet by mouth daily.       Disposition and follow-up:   [name] was discharged from [facility] in Good condition.  At the hospital follow up visit please address:  1.  Recovery from small bowel obstruction: Please assess her for any GI complaints and recommend adjusting a bowel  regimen as needed.  Follow-up Appointments:   Hospital Course by problem list: Active Problems:   Small bowel obstruction ([place])   1. Small bowel obstruction She initially presented with nausea vomiting abdominal pain that were controlled with narcotic pain medicines and Zofran. CT abdomen was obtained consistent with a partial small bowel obstruction that is attributed to her numerous past abdominal surgeries. General surgery was consulted for recommendations and continued with supportive care. She did not require NG tube placement for decompression as her symptoms progressively improved within a day of hospitalization. By day 2 she was advanced to a clear liquid diet and advanced to a full liquid diet prior to discharge home.  2. Type 2 diabetes mellitus Her home Levemir was decreased to 15 units while being held nothing by mouth and she was recommended to resume her home dose of 36 units when advancing back to regular diet at home.  3. Hypertension She was mild to moderately uncontrolled while her oral antihypertensive medications were held due to small bowel obstruction. This improved with resumption of her medications and no long-term changes were recommended.  Discharge Vitals:   BP (!) 156/63 (BP Location: Left Arm)   Pulse 86   Temp 98.5 F (36.9 C) (Oral)   Resp 18   Ht 5' 5"  (1.651 m)   Wt 183 lb 13.8 oz (83.4 kg)   SpO2 98%   BMI 30.60 kg/m   Pertinent Labs, Studies, and Procedures:  CT abdomen and pelvis on 07/19/16 demonstrating dilated proximal and mid small bowel loops with decompressed distal small bowel loops without any exact transition point visualized.  Discharge Instructions: Discharge Instructions    Call MD for:  persistant nausea and vomiting    Complete by:  As directed    Diet - low sodium heart healthy    Complete by:  As directed    Discharge instructions    Complete by:  As directed    You should advance your diet slowly eating only liquid or soft  foods such as tea, milk, soups, jello, ice cream and advance to soft foods over the next 2 days.   Increase activity slowly    Complete by:  As directed       Signed: Collier Salina, MD PGY-II Internal Medicine Resident Pager# [phone] 07/23/2016, 10:14 PM

## 2016-07-23 DIAGNOSIS — E119 Type 2 diabetes mellitus without complications: Secondary | ICD-10-CM | POA: Diagnosis not present

## 2016-07-24 ENCOUNTER — Other Ambulatory Visit: Payer: Self-pay | Admitting: Internal Medicine

## 2016-07-24 DIAGNOSIS — Z794 Long term (current) use of insulin: Principal | ICD-10-CM

## 2016-07-24 DIAGNOSIS — E1122 Type 2 diabetes mellitus with diabetic chronic kidney disease: Secondary | ICD-10-CM

## 2016-07-24 DIAGNOSIS — I2510 Atherosclerotic heart disease of native coronary artery without angina pectoris: Secondary | ICD-10-CM

## 2016-07-24 DIAGNOSIS — N183 Type 2 diabetes mellitus with diabetic chronic kidney disease: Secondary | ICD-10-CM

## 2016-07-24 DIAGNOSIS — E1165 Type 2 diabetes mellitus with hyperglycemia: Secondary | ICD-10-CM

## 2016-07-24 DIAGNOSIS — IMO0002 Reserved for concepts with insufficient information to code with codable children: Secondary | ICD-10-CM

## 2016-07-27 ENCOUNTER — Ambulatory Visit: Payer: Medicare Other | Admitting: Dietician

## 2016-07-27 ENCOUNTER — Other Ambulatory Visit: Payer: Self-pay | Admitting: *Deleted

## 2016-07-27 DIAGNOSIS — Z170 Estrogen receptor positive status [ER+]: Principal | ICD-10-CM

## 2016-07-27 DIAGNOSIS — C50311 Malignant neoplasm of lower-inner quadrant of right female breast: Secondary | ICD-10-CM

## 2016-07-27 MED ORDER — ANASTROZOLE 1 MG PO TABS
1.0000 mg | ORAL_TABLET | Freq: Every day | ORAL | 1 refills | Status: DC
Start: 2016-07-27 — End: 2016-12-19

## 2016-07-30 ENCOUNTER — Ambulatory Visit (INDEPENDENT_AMBULATORY_CARE_PROVIDER_SITE_OTHER): Payer: Medicare Other | Admitting: Dietician

## 2016-07-30 VITALS — Wt 185.9 lb

## 2016-07-30 DIAGNOSIS — E1165 Type 2 diabetes mellitus with hyperglycemia: Secondary | ICD-10-CM

## 2016-07-30 DIAGNOSIS — E118 Type 2 diabetes mellitus with unspecified complications: Secondary | ICD-10-CM | POA: Diagnosis not present

## 2016-07-30 DIAGNOSIS — N183 Chronic kidney disease, stage 3 (moderate): Principal | ICD-10-CM

## 2016-07-30 DIAGNOSIS — E1122 Type 2 diabetes mellitus with diabetic chronic kidney disease: Secondary | ICD-10-CM

## 2016-07-30 DIAGNOSIS — Z713 Dietary counseling and surveillance: Secondary | ICD-10-CM

## 2016-07-30 DIAGNOSIS — Z6831 Body mass index (BMI) 31.0-31.9, adult: Secondary | ICD-10-CM | POA: Diagnosis not present

## 2016-07-30 DIAGNOSIS — Z794 Long term (current) use of insulin: Secondary | ICD-10-CM | POA: Diagnosis not present

## 2016-07-30 DIAGNOSIS — IMO0002 Reserved for concepts with insufficient information to code with codable children: Secondary | ICD-10-CM

## 2016-07-30 NOTE — Patient Instructions (Addendum)
Prandin- take at lunch when you eat.  Steroid days- Can take prandin at lunch if blood sugar is more than 200 mg/dl even if you don' t eat.  Soft-Food Meal Plan A soft-food meal plan includes foods that are safe and easy to swallow. This meal plan typically is used:  If you are having trouble chewing or swallowing foods.  As a transition meal plan after only having had liquid meals for a long period. What do I need to know about the soft-food meal plan? A soft-food meal plan includes tender foods that are soft and easy to chew and swallow. In most cases, bite-sized pieces of food are easier to swallow. A bite-sized piece is about  inch or smaller. Foods in this plan do not need to be ground or pureed. Foods that are very hard, crunchy, or sticky should be avoided. Also, breads, cereals, yogurts, and desserts with nuts, seeds, or fruits should be avoided. What foods can I eat? Grains  Rice and wild rice. Moist bread, dressing, pasta, and noodles. Well-moistened dry or cooked cereals, such as farina (cooked wheat cereal), oatmeal, or grits. Biscuits, breads, muffins, pancakes, and waffles that have been well moistened. Vegetables  Shredded lettuce. Cooked, tender vegetables, including potatoes without skins. Vegetable juices. Broths or creamed soups made with vegetables that are not stringy or chewy. Strained tomatoes (without seeds). Fruits  Canned or well-cooked fruits. Soft (ripe), peeled fresh fruits, such as peaches, nectarines, kiwi, cantaloupe, honeydew melon, and watermelon (without seeds). Soft berries with small seeds, such as strawberries. Fruit juices (without pulp). Meats and Other Protein Sources  Moist, tender, lean beef. Mutton. Lamb. Veal. Chicken. [place]. Liver. Ham. Fish without bones. Eggs. Dairy  Milk, milk drinks, and cream. Plain cream cheese and cottage cheese. Plain yogurt. Sweets/Desserts  Flavored gelatin desserts. Custard. Plain ice cream, frozen yogurt, sherbet,  milk shakes, and malts. Plain cakes and cookies. Plain hard candy. Other  Butter, margarine (without trans fat), and cooking oils. Mayonnaise. Cream sauces. Mild spices, salt, and sugar. Syrup, molasses, honey, and jelly. The items listed above may not be a complete list of recommended foods or beverages. Contact your dietitian for more options.  What foods are not recommended? Grains  Dry bread, toast, crackers that have not been moistened. Coarse or dry cereals, such as bran, granola, and shredded wheat. Tough or chewy crusty breads, such as [place] bread or baguettes. Vegetables  Corn. Raw vegetables except shredded lettuce. Cooked vegetables that are tough or stringy. Tough, crisp, fried potatoes and potato skins. Fruits  Fresh fruits with skins or seeds or both, such as apples, pears, or grapes. Stringy, high-pulp fruits, such as papaya, pineapple, coconut, or mango. Fruit leather, fruit roll-ups, and all dried fruits. Meats and Other Protein Sources  Sausages and hot dogs. Meats with gristle. Fish with bones. Nuts, seeds, and chunky peanut or other nut butters.

## 2016-07-30 NOTE — Progress Notes (Signed)
  Medical Nutrition Therapy:  Appt start time: 1520 end time:  1610. Visit # 2  Assessment:  Primary concerns today: glycemic control [name] is for follow up.  She was recently in the hospital for small bowel obstruction for the third time. She is trying to adhere to a soft diet. She'd like her blood sugar to be better. Her target A1C is about a 6%. She explained some high afternoon readings saying she was on vacation.She is not interested in wearing the CGM again at this point.  She reports she is reading labels for carbs but not for sodium, but thinks she doesn't consume too much sodium. She is concerned that she doesn;t have an urge to urinate during the day after taking lasix in the morning. She is seeing the kidney doctor soon and will ask them. Feels she drinks plenty of liquids.  Learning Readiness: Ready and change in progress  ANTHROPOMETRICS: weight-185.9#, BMI-31.65 WEIGHT HISTORY:has lost ~5# and is retaining fluid  MEDICATIONS: 1m prandin with breakfast and dinner, levemir 36 units after dinner, 1/2 prednisone for pain in her feet as needed BLOOD SUGAR: last A1C was 8.2%, meter downloaded, she has higher blood sugars mid-day from 4-8 PM. Morning readings are mostly in target. The average for 30 days is 186 (~ 8%) with 43% in target <180 mg/dl.  DIETARY INTAKE: Usual eating pattern includes 2 meals and 1-2 snacks per day.  Says she mostly eats meat/protein and vegetables.  24-hr recall:  B ( 6-7 AM): fried or air fried chicken or sausage and cheese croissant with half bread removed Snk/Lunch( ~1:30 PM): she says she doesn't eat, but sometimes has a pack of cheese crackers and water  D (6-730PM): meat- chicken and fish and vegetables- likes broccoli, stopped eating greens, denies eating rice or potatoes Beverages: water, small portions of guava juice, lemonade  Usual physical activity: water aerobics are on hold right now  Progress Towards Goal(s):  In progress.   Nutritional  Diagnosis:  [place]-2.3 Food-medication interaction As related to mismatch of nutrition & activity with diabetes medication with little change As evidenced by her blood sugar fluctuations on her home meter.     Intervention:  Nutrition education about how to interpret blood glucose numbers to achieve and A1C 6-7%. Advised prandin TID as is ordered and reviewed action and purpose.  Coordination of care: none. Teaching Method Utilized: Visual, Auditory, Hands on Handouts given during visit include:AVS, carb portions for diabetes Barriers to learning/adherence to lifestyle change: lack of understanding, competing values Demonstrated degree of understanding via:  Teach Back   Monitoring/Evaluation:  Dietary intake, exercise, meter, and body weight in 6 months. PChariton RLudowici5/14/2018 4:33 PM. .

## 2016-07-31 ENCOUNTER — Ambulatory Visit
Admission: RE | Admit: 2016-07-31 | Discharge: 2016-07-31 | Disposition: A | Discharge status: Home / Self Care | Payer: Medicare Other | Source: Ambulatory Visit | Attending: General Surgery | Admitting: General Surgery

## 2016-07-31 DIAGNOSIS — N6459 Other signs and symptoms in breast: Secondary | ICD-10-CM

## 2016-08-06 ENCOUNTER — Other Ambulatory Visit: Payer: Self-pay | Admitting: Internal Medicine

## 2016-08-07 ENCOUNTER — Ambulatory Visit
Admission: RE | Admit: 2016-08-07 | Discharge: 2016-08-07 | Disposition: A | Discharge status: Home / Self Care | Payer: Medicare Other | Source: Ambulatory Visit | Attending: General Surgery | Admitting: General Surgery

## 2016-08-07 DIAGNOSIS — N6459 Other signs and symptoms in breast: Secondary | ICD-10-CM | POA: Diagnosis not present

## 2016-08-07 MED ORDER — GADOBENATE DIMEGLUMINE 529 MG/ML IV SOLN
18.0000 mL | Freq: Once | INTRAVENOUS | Status: AC | PRN
  Administered 2016-08-07: 18 mL via INTRAVENOUS

## 2016-08-08 ENCOUNTER — Other Ambulatory Visit: Payer: Self-pay | Admitting: General Surgery

## 2016-08-08 DIAGNOSIS — N6459 Other signs and symptoms in breast: Secondary | ICD-10-CM

## 2016-08-10 ENCOUNTER — Ambulatory Visit
Admission: RE | Admit: 2016-08-10 | Discharge: 2016-08-10 | Disposition: A | Discharge status: Home / Self Care | Payer: Medicare Other | Source: Ambulatory Visit | Attending: General Surgery | Admitting: General Surgery

## 2016-08-10 DIAGNOSIS — R928 Other abnormal and inconclusive findings on diagnostic imaging of breast: Secondary | ICD-10-CM | POA: Diagnosis not present

## 2016-08-10 DIAGNOSIS — N6459 Other signs and symptoms in breast: Secondary | ICD-10-CM | POA: Diagnosis not present

## 2016-08-10 NOTE — Progress Notes (Signed)
Please let patient know MRI looks good, no evidence of cancer.

## 2016-08-17 DIAGNOSIS — I639 Cerebral infarction, unspecified: Secondary | ICD-10-CM | POA: Diagnosis not present

## 2016-08-17 DIAGNOSIS — M109 Gout, unspecified: Secondary | ICD-10-CM | POA: Diagnosis not present

## 2016-08-17 DIAGNOSIS — N183 Chronic kidney disease, stage 3 (moderate): Secondary | ICD-10-CM | POA: Diagnosis not present

## 2016-08-17 DIAGNOSIS — E1129 Type 2 diabetes mellitus with other diabetic kidney complication: Secondary | ICD-10-CM | POA: Diagnosis not present

## 2016-08-17 DIAGNOSIS — I129 Hypertensive chronic kidney disease with stage 1 through stage 4 chronic kidney disease, or unspecified chronic kidney disease: Secondary | ICD-10-CM | POA: Diagnosis not present

## 2016-08-29 ENCOUNTER — Telehealth: Payer: Self-pay

## 2016-09-12 ENCOUNTER — Other Ambulatory Visit: Payer: Self-pay | Admitting: *Deleted

## 2016-09-12 MED ORDER — CYANOCOBALAMIN 1000 MCG/ML IJ SOLN: 1000.0000 ug | INTRAMUSCULAR | 11 refills | Status: DC

## 2016-09-17 DIAGNOSIS — C50511 Malignant neoplasm of lower-outer quadrant of right female breast: Secondary | ICD-10-CM | POA: Diagnosis not present

## 2016-09-25 ENCOUNTER — Other Ambulatory Visit: Payer: Self-pay | Admitting: Internal Medicine

## 2016-09-25 DIAGNOSIS — E1142 Type 2 diabetes mellitus with diabetic polyneuropathy: Secondary | ICD-10-CM

## 2016-10-11 ENCOUNTER — Encounter: Payer: Self-pay | Admitting: Internal Medicine

## 2016-10-11 ENCOUNTER — Ambulatory Visit (INDEPENDENT_AMBULATORY_CARE_PROVIDER_SITE_OTHER): Payer: Medicare Other | Admitting: Internal Medicine

## 2016-10-11 ENCOUNTER — Telehealth: Payer: Self-pay | Admitting: Internal Medicine

## 2016-10-11 VITALS — BP 125/50 | HR 61 | Temp 98.1°F | Wt 192.2 lb

## 2016-10-11 DIAGNOSIS — IMO0002 Reserved for concepts with insufficient information to code with codable children: Secondary | ICD-10-CM

## 2016-10-11 DIAGNOSIS — I2510 Atherosclerotic heart disease of native coronary artery without angina pectoris: Secondary | ICD-10-CM

## 2016-10-11 DIAGNOSIS — R0601 Orthopnea: Secondary | ICD-10-CM | POA: Diagnosis not present

## 2016-10-11 DIAGNOSIS — R0602 Shortness of breath: Secondary | ICD-10-CM | POA: Insufficient documentation

## 2016-10-11 DIAGNOSIS — N183 Chronic kidney disease, stage 3 unspecified: Secondary | ICD-10-CM

## 2016-10-11 DIAGNOSIS — I129 Hypertensive chronic kidney disease with stage 1 through stage 4 chronic kidney disease, or unspecified chronic kidney disease: Secondary | ICD-10-CM | POA: Diagnosis not present

## 2016-10-11 DIAGNOSIS — E1165 Type 2 diabetes mellitus with hyperglycemia: Secondary | ICD-10-CM | POA: Diagnosis not present

## 2016-10-11 DIAGNOSIS — I10 Essential (primary) hypertension: Secondary | ICD-10-CM

## 2016-10-11 DIAGNOSIS — Z794 Long term (current) use of insulin: Secondary | ICD-10-CM | POA: Diagnosis not present

## 2016-10-11 DIAGNOSIS — E1122 Type 2 diabetes mellitus with diabetic chronic kidney disease: Secondary | ICD-10-CM | POA: Diagnosis not present

## 2016-10-11 DIAGNOSIS — D631 Anemia in chronic kidney disease: Secondary | ICD-10-CM

## 2016-10-11 LAB — COMPREHENSIVE METABOLIC PANEL
ALBUMIN: 3.9 g/dL (ref 3.5–5.0)
ALT: 16 U/L (ref 14–54)
AST: 18 U/L (ref 15–41)
Alkaline Phosphatase: 52 U/L (ref 38–126)
Anion gap: 7 (ref 5–15)
BUN: 50 mg/dL — AB (ref 6–20)
CO2: 28 mmol/L (ref 22–32)
Calcium: 9.8 mg/dL (ref 8.9–10.3)
Chloride: 105 mmol/L (ref 101–111)
Creatinine, Ser: 2.05 mg/dL — ABNORMAL HIGH (ref 0.44–1.00)
GFR calc Af Amer: 27 mL/min — ABNORMAL LOW (ref >60)
GFR calc non Af Amer: 24 mL/min — ABNORMAL LOW (ref >60)
GLUCOSE: 99 mg/dL (ref 65–99)
Potassium: 4 mmol/L (ref 3.5–5.1)
SODIUM: 140 mmol/L (ref 135–145)
Total Bilirubin: 0.5 mg/dL (ref 0.3–1.2)
Total Protein: 8.5 g/dL — ABNORMAL HIGH (ref 6.5–8.1)

## 2016-10-11 LAB — BRAIN NATRIURETIC PEPTIDE: B Natriuretic Peptide: 56.5 pg/mL (ref 0.0–100.0)

## 2016-10-11 LAB — POCT GLYCOSYLATED HEMOGLOBIN (HGB A1C): Hemoglobin A1C: 8

## 2016-10-11 LAB — GLUCOSE, CAPILLARY: Glucose-Capillary: 113 mg/dL — ABNORMAL HIGH (ref 65–99)

## 2016-10-11 NOTE — Assessment & Plan Note (Signed)
This problem is chronic and stable. Her A1c trend has been 7.7 - 8.2 - 8.0 today. She is on Levemir 36 units and Prandin 2 mg before meals. We did not discuss this further as her orthopnea took precedence.

## 2016-10-11 NOTE — Telephone Encounter (Signed)
Left her a message on her cell phone to call the clinic so that they could transfer her phone call to me in my office.

## 2016-10-11 NOTE — Progress Notes (Signed)
   Subjective:    Patient ID: [name], female    DOB: Apr 25, 1945, 71 y.o.   MRN: [id number]  HPI  [name] is here for leg swelling. Please see the A&P for the status of the pt's chronic medical problems.  ROS : per ROS section and in problem oriented charting. All other systems are negative.  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  Respiratory: Positive for shortness of breath. Negative for cough.        + DOE  + orthopnea  Cardiovascular: Positive for leg swelling. Negative for chest pain.       Decreased exercise tolerance  Gastrointestinal: Negative for abdominal distention.      Objective:   Physical Exam  Constitutional: She appears well-developed and well-nourished. No distress.  HENT:  Head: Normocephalic and atraumatic.  Right Ear: External ear normal.  Left Ear: External ear normal.  Nose: Nose normal.  Eyes: Conjunctivae and EOM are normal.  Cardiovascular: Normal rate, regular rhythm and normal heart sounds.   No murmur heard. Pulmonary/Chest: Effort normal and breath sounds normal. She has no rales.  Good air flow, no crackles  Musculoskeletal: She exhibits edema and tenderness.  + 1 edema to knees B, L>R  Skin: She is not diaphoretic.  Psychiatric: She has a normal mood and affect. Her behavior is normal. Judgment and thought content normal.      Assessment & Plan:

## 2016-10-11 NOTE — Assessment & Plan Note (Addendum)
She had a new complaint of left thigh pain whenever she sit still or walk for too long. She has known coronary artery disease and is at significantly high risk for peripheral arterial disease. We performed clinic ABIs which were normal and will be scanned into the clinic chart.  PLAN : RF management

## 2016-10-11 NOTE — Assessment & Plan Note (Addendum)
This problem is new. Her lower extremities has been swollen for the past 1-2 months. This is bilateral and worse than normal. The edema increases as the day goes on and is slightly better in the morning. She also has new orthopnea for one to 2 months and is having to use 2 pillows on top of each other to prop her up in bed. She has also noticed a weight gain of 9 pounds since May 5th. She denies any abdominal distention which is different than usual. She has noticed decreased exercise exertion but this is more chronic over the past year and not acute in the past 1-2 months. 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 She continues to use her Lasix 20 mg twice a day.  Her exam shows pitting edema in the lower extremities but her lungs have no crackles. She also has no increased work of breathing.   I am concerned that she has volume overload due to her signs and symptoms. This seems to be mostly extravascular volume and not intravascular volume. Before attributing her LE edema to CVI, I need to R/O other etiologies. This could be due to an onset of a new cardiomyopathy as she has known CAD. It could also be due to worsening renal dysfunction as she has known stage III chronic kidney disease. I think it is unlikely that she has nephrotic syndrome or significant hypothyroidism or liver failure. Her BNP was only 57, supporting extravascular edema rather than intravascular. Her liver function test were normal. Unfortunately, her creatinine is increased from her baseline of 1.8-2.05. Despite her normal BNP, I plan to continue to workup etiologies other than chronic vascular insufficiency. I am ordering an echo, TSH, and urine microalbumin.  I will try diuresis with an increased dose of Lasix for a brief trial. I'm concerned that this will not be effective for her lower  extremity edema and may even worsen her creatinine as her BNP is not elevated indicating there may not be a significant component of intravascular volume overload. Therefore, I will only increase her Lasix for 3 days and asked her to return on the 30th for a creatinine.   PLAN : Lasix 40 twice a day for 3 days  Return to clinic on the 30th for a BMP and ACC appointment  Echo  Follow-up urine microalbumin results

## 2016-10-11 NOTE — Patient Instructions (Signed)
1. I think you may have too much fluid.I am checking your heart and kidneys.  2. Please return in 1-2 weeks 3. I will call you later today.

## 2016-10-11 NOTE — Assessment & Plan Note (Signed)
This problem is chronic and stable. Her regimen is losartan 25, amlodipine 10, Coreg 6.25 twice a day, Lasix 20 twice a day. Her blood pressure is at goal. She has no side effects to these medications.  PLAN:  Cont current meds  BP Readings from Last 3 Encounters:  10/11/16 (!) 125/50  07/21/16 (!) 156/63  06/21/16 (!) 153/60

## 2016-10-12 LAB — MICROALBUMIN / CREATININE URINE RATIO
CREATININE, UR: 28.8 mg/dL
MICROALB/CREAT RATIO: 45.5 mg/g{creat} — AB (ref 0.0–30.0)
Microalbumin, Urine: 13.1 ug/mL

## 2016-10-12 LAB — CBC
HEMATOCRIT: 28.2 % — AB (ref 34.0–46.6)
Hemoglobin: 9.1 g/dL — ABNORMAL LOW (ref 11.1–15.9)
MCH: 25.4 pg — ABNORMAL LOW (ref 26.6–33.0)
MCHC: 32.3 g/dL (ref 31.5–35.7)
MCV: 79 fL (ref 79–97)
Platelets: 351 10*3/uL (ref 150–379)
RBC: 3.58 x10E6/uL — AB (ref 3.77–5.28)
RDW: 15.1 % (ref 12.3–15.4)
WBC: 8.5 10*3/uL (ref 3.4–10.8)

## 2016-10-12 LAB — TSH: TSH: 2.87 u[IU]/mL (ref 0.450–4.500)

## 2016-10-15 ENCOUNTER — Ambulatory Visit: Payer: Medicare Other

## 2016-10-15 ENCOUNTER — Other Ambulatory Visit: Payer: Self-pay | Admitting: Internal Medicine

## 2016-10-16 ENCOUNTER — Ambulatory Visit (INDEPENDENT_AMBULATORY_CARE_PROVIDER_SITE_OTHER): Payer: Medicare Other | Admitting: Internal Medicine

## 2016-10-16 ENCOUNTER — Encounter: Payer: Self-pay | Admitting: Internal Medicine

## 2016-10-16 VITALS — BP 146/54 | HR 61 | Temp 98.3°F | Ht 65.0 in | Wt 189.9 lb

## 2016-10-16 DIAGNOSIS — Z79899 Other long term (current) drug therapy: Secondary | ICD-10-CM

## 2016-10-16 DIAGNOSIS — N183 Chronic kidney disease, stage 3 unspecified: Secondary | ICD-10-CM

## 2016-10-16 DIAGNOSIS — I2510 Atherosclerotic heart disease of native coronary artery without angina pectoris: Secondary | ICD-10-CM | POA: Diagnosis not present

## 2016-10-16 DIAGNOSIS — Z8249 Family history of ischemic heart disease and other diseases of the circulatory system: Secondary | ICD-10-CM | POA: Diagnosis not present

## 2016-10-16 DIAGNOSIS — Z841 Family history of disorders of kidney and ureter: Secondary | ICD-10-CM

## 2016-10-16 MED ORDER — FUROSEMIDE 40 MG PO TABS: 40.0000 mg | ORAL_TABLET | Freq: Two times a day (BID) | ORAL | 2 refills | Status: DC

## 2016-10-16 NOTE — Progress Notes (Signed)
CC: Lower extremity swelling follow up  HPI:  [name] is a 71 y.o. with a PMH of CAD, CHF, and CKD who is presenting today for follow up of recently developed bilateral lower extremity swelling. She was seen on 10/10/2016 and told to increase her Lasix 20 mg BID to 40 mg BID. She was also scheduled for an echocardiogram at that time, given that she developed new bilateral lower extremity swelling, recent unintentional weight gain, dyspnea on exertion, and orthopnea. She is scheduled for echocardiogram on 10/26/2016.  Since her last visit the patient has felt well. She was able to take 40 mg of Lasix BID without complication. She feels that she has lost some of the weight she gained at her last visit. She noticed that the swelling in her legs has improved significantly but she still notices some swelling, especially at the end of the day. Her legs are no longer tender to the touch. She continues to endorse being out of breath when climbing a flight of stairs (cannot climb more than 4 stairs at once), walking long distances, and when sleeping at night (she has to sleep on 3 pillows). She denies chest pain and shortness of breath at rest.   Past Medical History:  Diagnosis Date  . Abdominal discomfort    Chronic N/V/D. Presumptive dx Crohn's dx per elevated p ANCA. Failed Entocort and Pentasa. Sep 2003 - ileocolectomy c anastomosis per Dr Deon Pilling 2/2 adhesions - path was hegative for Crohns. EGD, Sm bowel follow through (11/03), and an eteroclysis (10/03) were unrevealing. Cuases hypomag and hypocalcemia.  . Adnexal mass 8/03   s/p lap BSO (R ovarian fibroma) & lysis of adhesions  . Allergy    Seasonal  . Anemia    Multifactorial. Baseline HgB 10-11 ish. B12 def - 150 in 3/10. Fe Def - ferritin 35 3/10. Both are being repleted.  . Breast cancer ([place]) 03/16/13   right, 5 o'clock  . CAD (coronary artery disease) 1996   1996 - PTCA and angioplasty diagonal branch. 2000 - Rotoblator &  angiopllasty of diagonal. 2006 - subendocardial AMI, DES to proximal LAD.Marland Kitchen Also had 90% stenosis in distal apical LAD. EF 55 with apical hypokinesis. Indefinite ASA and Plavix.  . CHF (congestive heart failure) ([place])   . Chronic kidney disease    Chronic renal insuff baseline Cr 1.2 - 1.4 ish.  . Chronic pain    CT 10/10 = Spinal stenosis L2 - S1.  . Diabetes mellitus    Insulin dependent  . Hx of radiation therapy 06/02/13- 07/16/13   right rbeast 4500 cGy 25 sessions, right breast boost 1600 cGy in 8 sessions  . Hyperlipidemia    Managed with both a statin and Welchol. Welchol stopped 2014 2/2 cost and started on fenofibrate   . Hypertension    2006 B renal arteries patent. 2003 MRA - no RAS. 2003 pheo W/U Dr Hassell Done reportedly negative.  . Lupus   . RBBB   . Stroke [facility])    Incidental finding MRI 2002 L lacunar infarct  . Wears dentures    top   Review of Systems:   Patient endorses dyspnea on exertion and mild lower extremity swelling, as per HPI Patient denies radiating or exertional chest pain, shortness of breath, abdominal pain, diaphoresis, nausea/vomiting, and change in bowel/bladder habits.  Physical Exam:  Vitals:   10/16/16 1519  BP: (!) 146/54  Pulse: 61  Temp: 98.3 F (36.8 C)  TempSrc: Oral  SpO2: 100%  Weight: 189 lb 14.4 oz (86.1 kg)  Height: 5' 5"  (1.651 m)   Physical Exam  Constitutional: She appears well-developed and well-nourished. No distress.  Cardiovascular: Normal rate, regular rhythm and intact distal pulses.  Exam reveals no friction rub.   No murmur heard. Pulmonary/Chest: Effort normal. No respiratory distress. She has no wheezes.  No crackles appreciated  Abdominal: Soft. She exhibits no distension. There is no tenderness.  Musculoskeletal: She exhibits edema (trace pitting edema to distal knee bilaterally). She exhibits no tenderness (of biltaeral lower extremities).  Skin: Skin is warm and dry. Capillary refill takes less than 2 seconds. No  rash noted. No erythema.  No overlying skin changes, including hyperpigmentation or ulceration.  Psychiatric: She has a normal mood and affect. Her behavior is normal. Thought content normal.   Assessment & Plan:   See Encounters Tab for problem based charting.  Patient seen with Dr. Evette Doffing.

## 2016-10-16 NOTE — Assessment & Plan Note (Signed)
The patient's BMP on last visit as 2.0, elevated from historic baseline around 1.4. A repeat BMP was obtained today to ensure that kidney function has remained stable/not worsened with increased dose of Lasix. The patient will be called with the results of this blood test and will continue to follow up with her PCP in about 3 weeks (after her echocardiogram) regarding her kidney function.  Plan: -Follow up BMP  -Continue 40 mg Lasix BID

## 2016-10-16 NOTE — Patient Instructions (Addendum)
Thank you for seeing [place] today!  Please continue taking two 20 mg Lasix (total 40 mg Furosemide) tablets two times a day.   Please continue taking all medications as previously prescribed and call the clinic if you have questions. Please call the clinic if you develop headaches and/or dizziness on this medication.  Please follow up for your echocardiogram and we will see you in clinic in 3-4 weeks to discuss the results of the test. Please return sooner if you have difficulty breathing, leg swelling, or chest pain.

## 2016-10-16 NOTE — Assessment & Plan Note (Addendum)
Patient denies chest pain and is compliant with all medications, including amlodipine, atorvastatin, carvedilol, clopidogrel, and losartan. She states that she rarely has to use nitroglycerin, although she has this medication at home. Her new symptoms of volume are concerning for worsening CHF and the patient is scheduled for echocardiogram on 10/26/2016. Today the patient exhibited trace edema on exam and reported improvement in her symptoms of weight gain and LE swelling with Lasix 40 mg BID. Since this regimen was well tolerated, she was told to continue this dose. She will be contacted if her BMP significantly changes on this medication and told to return to her old regimen.  Plan: -Continue current regimen for CAD s/p stent placement and hypertension. The patient will also continue Lasix 40 mg BID. -F/u BMP -RTC in 3 weeks after echocardiogram for follow up discussion with PCP Dr. Lynnae January

## 2016-10-17 LAB — BMP8+ANION GAP
ANION GAP: 18 mmol/L (ref 10.0–18.0)
BUN/Creatinine Ratio: 22 (ref 12–28)
BUN: 44 mg/dL — AB (ref 8–27)
CO2: 24 mmol/L (ref 20–29)
CREATININE: 2.02 mg/dL — AB (ref 0.57–1.00)
Calcium: 10 mg/dL (ref 8.7–10.3)
Chloride: 99 mmol/L (ref 96–106)
GFR calc Af Amer: 28 mL/min/{1.73_m2} — ABNORMAL LOW (ref >59)
GFR calc non Af Amer: 25 mL/min/{1.73_m2} — ABNORMAL LOW (ref >59)
Glucose: 151 mg/dL — ABNORMAL HIGH (ref 65–99)
Potassium: 4.2 mmol/L (ref 3.5–5.2)
Sodium: 141 mmol/L (ref 134–144)

## 2016-10-17 NOTE — Progress Notes (Signed)
Internal Medicine Clinic Attending  I saw and evaluated the patient.  I personally confirmed the key portions of the history and exam documented by Dr. Nedrud and I reviewed pertinent patient test results.  The assessment, diagnosis, and plan were formulated together and I agree with the documentation in the resident's note.  

## 2016-10-21 ENCOUNTER — Telehealth: Payer: Self-pay

## 2016-10-21 NOTE — Telephone Encounter (Signed)
Called and left a message with new appt due to pal time  [name]

## 2016-10-25 ENCOUNTER — Ambulatory Visit: Payer: Medicare Other

## 2016-10-26 ENCOUNTER — Ambulatory Visit (HOSPITAL_COMMUNITY)
Admission: RE | Admit: 2016-10-26 | Discharge: 2016-10-26 | Disposition: A | Discharge status: Home / Self Care | Payer: Medicare Other | Source: Ambulatory Visit | Attending: Internal Medicine | Admitting: Internal Medicine

## 2016-10-26 DIAGNOSIS — E119 Type 2 diabetes mellitus without complications: Secondary | ICD-10-CM | POA: Diagnosis not present

## 2016-10-26 DIAGNOSIS — I119 Hypertensive heart disease without heart failure: Secondary | ICD-10-CM | POA: Diagnosis not present

## 2016-10-26 DIAGNOSIS — E785 Hyperlipidemia, unspecified: Secondary | ICD-10-CM | POA: Diagnosis not present

## 2016-10-26 DIAGNOSIS — I4510 Unspecified right bundle-branch block: Secondary | ICD-10-CM | POA: Diagnosis not present

## 2016-10-26 DIAGNOSIS — I361 Nonrheumatic tricuspid (valve) insufficiency: Secondary | ICD-10-CM | POA: Diagnosis not present

## 2016-10-26 DIAGNOSIS — R0601 Orthopnea: Secondary | ICD-10-CM

## 2016-10-26 NOTE — Progress Notes (Signed)
  Echocardiogram 2D Echocardiogram has been performed.  [name] 10/26/2016, 2:45 PM

## 2016-10-28 ENCOUNTER — Other Ambulatory Visit: Payer: Self-pay | Admitting: Internal Medicine

## 2016-11-02 ENCOUNTER — Encounter: Payer: Self-pay | Admitting: Internal Medicine

## 2016-11-13 ENCOUNTER — Ambulatory Visit (INDEPENDENT_AMBULATORY_CARE_PROVIDER_SITE_OTHER): Payer: Medicare Other | Admitting: *Deleted

## 2016-11-13 DIAGNOSIS — D631 Anemia in chronic kidney disease: Secondary | ICD-10-CM

## 2016-11-13 DIAGNOSIS — N184 Chronic kidney disease, stage 4 (severe): Secondary | ICD-10-CM | POA: Diagnosis not present

## 2016-11-13 MED ORDER — CYANOCOBALAMIN 1000 MCG/ML IJ SOLN
1000.0000 ug | Freq: Once | INTRAMUSCULAR | Status: AC
  Administered 2016-11-13: 1000 ug via INTRAMUSCULAR

## 2016-11-15 ENCOUNTER — Ambulatory Visit: Payer: Medicare Other | Admitting: Internal Medicine

## 2016-11-27 NOTE — Progress Notes (Signed)
Patient ID: [name], female   DOB: 1945/07/31, 71 y.o.   MRN: [id number]   [name] is seen today for F/U of CAD with pevious rotoblator to the LAD in 2000. Her last myovue on 11/2006 showed mild apical thinning and was low risk. She has not had any SSCP and has been compliant with her meds.Her BP is alwyas hard to Rx and she is on multiple drugs with persistant mild tachycardia. She denies dyspnea, PND orthopnea. Has some swelling in her feet. Diet still an issue with her DM.   Seeing Dr [name] for XRT breast CA doing well   Had new LE edema seen by family medicine started on lasix 40 bid Cr 2.02 10/16/16 baseline around 1.8 K 4.2   Echo 10/26/16 reviewed EF 60-65%  GLS -23 Grade one diastolic PA 42 mmHg   Edema worse last 2 months Rx with lasix by primary No history of DVT LLE worse than right  ROS: Denies fever, malais, weight loss, blurry vision, decreased visual acuity, cough, sputum, SOB, hemoptysis, pleuritic pain, palpitaitons, heartburn, abdominal pain, melena, lower extremity edema, claudication, or rash.  All other systems reviewed and negative  General: Affect appropriate Healthy:  appears stated age 71: normal Neck supple with no adenopathy JVP normal no bruits no thyromegaly Lungs clear with no wheezing and good diaphragmatic motion Heart:  S1/S2 2/6 SEM murmur, no rub, gallop or click PMI normal Abdomen: benighn, BS positve, no tenderness, no AAA no bruit.  No HSM or HJR Distal pulses intact with no bruits Plus one RLE  Edema Plus 2 LLE  Neuro non-focal Skin warm and dry No muscular weakness   Current Outpatient Prescriptions  Medication Sig Dispense Refill  . acetaminophen (TYLENOL) 325 MG tablet Take 650 mg by mouth every 6 (six) hours as needed for mild pain.    Marland Kitchen amLODipine (NORVASC) 10 MG tablet TAKE 1 TABLET BY MOUTH  DAILY 90 tablet 3  . anastrozole (ARIMIDEX) 1 MG tablet Take 1 tablet (1 mg total) by mouth daily. 90 tablet 1  . aspirin EC 81 MG tablet  Take 81 mg by mouth daily.    Marland Kitchen atorvastatin (LIPITOR) 20 MG tablet TAKE 1 TABLET BY MOUTH AT  BEDTIME 90 tablet 3  . carvedilol (COREG) 6.25 MG tablet Take 6.25 mg by mouth 2 (two) times daily.    . clopidogrel (PLAVIX) 75 MG tablet TAKE 1 TABLET BY MOUTH ONCE A DAY 90 tablet 3  . cyanocobalamin (,VITAMIN B-12,) 1000 MCG/ML injection Inject 1 mL (1,000 mcg total) into the muscle every 30 (thirty) days. 1 mL 11  . desloratadine (CLARINEX) 5 MG tablet Take 5 mg by mouth at bedtime as needed (allergies). Reported on 04/11/2015    . fenofibrate 54 MG tablet Take 54 mg by mouth daily.     . ferrous fumarate (HEMOCYTE - 106 MG FE) 325 (106 Fe) MG TABS tablet Take 1 tablet (106 mg of iron total) by mouth daily. 30 each 0  . furosemide (LASIX) 40 MG tablet Take 1 tablet (40 mg total) by mouth 2 (two) times daily. 180 tablet 2  . gabapentin (NEURONTIN) 300 MG capsule TAKE 1 CAPSULE BY MOUTH AT  BEDTIME 90 capsule 3  . hydrOXYzine (VISTARIL) 50 MG capsule TAKE 1 CAPSULE BY MOUTH ONCE DAILY AT BEDTIME AS NEEDED 90 capsule 3  . Insulin Detemir (LEVEMIR FLEXTOUCH) 100 UNIT/ML Pen Inject 36 UNITS SUBCUTANEOUSLY at the same time each day 15 mL 11  . Insulin Pen Needle (  B-D UF III MINI PEN NEEDLES) 31G X 5 MM MISC Use to inject insulin one time a day 100 each 3  . loperamide (IMODIUM) 2 MG capsule TAKE ONE CAPSULE BY MOUTH ONCE DAILY AS NEEDED FOR DIARRHEA OR LOOSE STOOLS 30 capsule 2  . losartan (COZAAR) 25 MG tablet Take 1 tablet (25 mg total) by mouth daily. 90 tablet 5  . potassium chloride (K-DUR) 10 MEQ tablet Take 2 tablets (20 mEq total) by mouth daily. 180 tablet 3  . repaglinide (PRANDIN) 2 MG tablet TAKE 1 TABLET BY MOUTH 3  TIMES A DAY BEFORE MEALS 270 tablet 3  . ULORIC 40 MG tablet Take 1 tablet by mouth daily.    . nitroGLYCERIN (NITROSTAT) 0.4 MG SL tablet Place 1 tablet (0.4 mg total) under the tongue every 5 (five) minutes as needed for chest pain. 25 tablet 3  . ONE TOUCH ULTRA TEST test strip  USE TO CHECK BLOOD SUGAR 3  TO 4 TIMES DAILY 400 each 3   No current facility-administered medications for this visit.    Facility-Administered Medications Ordered in Other Visits  Medication Dose Route Frequency Provider Last Rate Last Dose  . 0.9 %  sodium chloride infusion   Intravenous Continuous Burgess Estelle, MD 150 mL/hr at 10/03/15 1607      Allergies  Percocet [oxycodone-acetaminophen]; Diazepam; Haloperidol lactate; Lorazepam; Morphine sulfate; Propoxyphene hcl; and Tramadol hcl  Electrocardiogram:  2916 SR RBBB normal ST segments  05/18/16  SR rate 85 LAD RBBB   Assessment and Plan  CAD: Stable with no angina and good activity level.  Continue medical Rx HTN: increase cozaar 50 mg since stopping norvasc due to edema Chol:  Cholesterol is at goal.  Continue current dose of statin and diet Rx.  No myalgias or side effects.  F/U  LFT's in 6 months. Lab Results  Component Value Date   LDLCALC 88 07/21/2015            DM: Discussed low carb diet.  Target hemoglobin A1c is 6.5 or less.  Continue current medications. Breast Cancer f/u oncology post XRT RBBB ECG stable no change  Neuropathy stable continue neurontin  Edema:  Etiology not clear EF normal only grade one diastolic will order venous duplex Stop norvasc And see if it improves   [name]

## 2016-12-06 ENCOUNTER — Ambulatory Visit (INDEPENDENT_AMBULATORY_CARE_PROVIDER_SITE_OTHER): Payer: Medicare Other | Admitting: Cardiovascular Disease

## 2016-12-06 ENCOUNTER — Encounter: Payer: Self-pay | Admitting: Cardiovascular Disease

## 2016-12-06 ENCOUNTER — Encounter (INDEPENDENT_AMBULATORY_CARE_PROVIDER_SITE_OTHER): Payer: Self-pay

## 2016-12-06 VITALS — BP 140/60 | HR 76 | Ht 65.0 in | Wt 187.0 lb

## 2016-12-06 DIAGNOSIS — I872 Venous insufficiency (chronic) (peripheral): Secondary | ICD-10-CM

## 2016-12-06 DIAGNOSIS — I824Z3 Acute embolism and thrombosis of unspecified deep veins of distal lower extremity, bilateral: Secondary | ICD-10-CM | POA: Diagnosis not present

## 2016-12-06 MED ORDER — NITROGLYCERIN 0.4 MG SL SUBL: 0.4000 mg | SUBLINGUAL_TABLET | SUBLINGUAL | 3 refills | Status: DC | PRN

## 2016-12-06 MED ORDER — LOSARTAN POTASSIUM 50 MG PO TABS: 50.0000 mg | ORAL_TABLET | Freq: Every day | ORAL | 3 refills | Status: DC

## 2016-12-06 NOTE — Patient Instructions (Addendum)
Medication Instructions:  Your physician has recommended you make the following change in your medication:  1-STOP Norvasc 2-INCREASE Losartan 50 mg by mouth daily  Labwork: NONE  Testing/Procedures: Your physician has requested that you have a lower extremity venous duplex.   Follow-Up: Your physician wants you to follow-up in: 6 months with Dr. Johnsie Cancel. You will receive a reminder letter in the mail two months in advance. If you don't receive a letter, please call our office to schedule the follow-up appointment.   If you need a refill on your cardiac medications before your next appointment, please call your pharmacy.

## 2016-12-07 ENCOUNTER — Ambulatory Visit (HOSPITAL_COMMUNITY): Admission: RE | Admit: 2016-12-07 | Payer: Medicare Other | Source: Ambulatory Visit

## 2016-12-11 ENCOUNTER — Telehealth: Payer: Self-pay | Admitting: Cardiovascular Disease

## 2016-12-11 DIAGNOSIS — M48061 Spinal stenosis, lumbar region without neurogenic claudication: Secondary | ICD-10-CM | POA: Diagnosis not present

## 2016-12-11 NOTE — Telephone Encounter (Signed)
New Message  Pt call requesting to speak with RN. Pt states she was to have a procedure completed but no one has called to set the appt up. Pt does not know the name of the procedure. Please call back to discuss

## 2016-12-11 NOTE — Telephone Encounter (Signed)
LMTCB

## 2016-12-12 NOTE — Telephone Encounter (Signed)
Called patient back. Patient needs BLE venous duplex scheduled. Patient stated she was going out of town and she would not be back until next week. Patient on the schedule for next Monday the earliest appointment that could accommodate patient's schedule.

## 2016-12-13 ENCOUNTER — Other Ambulatory Visit: Payer: Self-pay | Admitting: Internal Medicine

## 2016-12-13 ENCOUNTER — Telehealth: Payer: Self-pay | Admitting: Dietician

## 2016-12-13 DIAGNOSIS — R600 Localized edema: Secondary | ICD-10-CM

## 2016-12-13 NOTE — Telephone Encounter (Signed)
Steroid-Induced Hyperglycemia Prevention and Management [name] is a 71 y.o. female who meets criteria for [facility] glucose monitoring program (diabetes patient prescribed short course of steroids).  A/P Current Regimen Patient prescribed prednisone 50 mg daily x 5 days, currently on day 2 of therapy. Patient taking prednisone in the AM ,(not taking what was prescribed because of her high blood sugars), 3 more days of steroids. She is took one 10 mg pill 1 today, supposed to take 5 pills today.     Prednisone indication: back and leg pain  Current DM regimen levemir 36 units daily at night, 39m prandin three times daily before meals  Home BG Monitoring  Patient does have a meter at home and does check BG at home. Meter was not supplied.  CBGs at home:cbgs yesterday 400-500 , 400s this am and 284 now, drank water, cut way back on starchy foods.   CBGs prior to steroid course unknown, A1C prior to steroid course 8% in 7/18  S/Sx of hyper- or hypoglycemia: fatigue, none  Medication Management  Switch prednisone dose to AM no  Additional treatment for BG control is indicated at this time.  Increase prandin to 4 mg three times a day BEFORE meals  If still >200 mg/dl tomorrow, then   Increase basal insulin to 40 units and  switch to morning dosing  Physician preference level per protocol: 1  Medication supply Patient will use own medication  Patient Education  Advised patient to monitor BG while on steroid therapy (at least twice daily prior to first 2 meals of the day).  Patient educated about signs/symptoms and advised to contact clinic if hyper- or hypoglycemic.  Patient did  verbalize understanding of information and regimen by repeating back topics discussed.   Advised her to decrease both prandin and levemir to usual doses Monday  Follow-up Monday and patient informed to call after hours doctor if needed this weekend  12-20-16 with Dr. BLouie Casa  DButch Penny11:07 AM 12/13/2016

## 2016-12-17 ENCOUNTER — Ambulatory Visit (HOSPITAL_COMMUNITY)
Admission: RE | Admit: 2016-12-17 | Discharge: 2016-12-17 | Disposition: A | Discharge status: Home / Self Care | Payer: Medicare Other | Source: Ambulatory Visit | Attending: Cardiology | Admitting: Cardiology

## 2016-12-17 DIAGNOSIS — R600 Localized edema: Secondary | ICD-10-CM | POA: Insufficient documentation

## 2016-12-17 DIAGNOSIS — M7989 Other specified soft tissue disorders: Secondary | ICD-10-CM | POA: Diagnosis not present

## 2016-12-18 NOTE — Progress Notes (Signed)
[place]  [phone]) [phone] [phone]) [phone]  Clinic Follow up Note   Patient Care Team: Bartholomew Crews, MD as PCP - General Thelma Comp, [place] as Consulting Physician (Optometry) Stark Klein, MD as Consulting Physician (General Surgery) Truitt Merle, MD as Consulting Physician (Hematology) 12/19/2016  SUMMARY OF ONCOLOGIC HISTORY: Oncology History   Cancer Staging Breast cancer of lower-inner quadrant of right female breast [facility]) Staging form: Breast, AJCC 7th Edition - Clinical: Stage IA (T1b, N0, cM0) - Unsigned Staging comments: Staged at breast conference 03/25/13.  - Pathologic stage from 05/12/2013: Stage IA (T1b, N0, cM0) - Signed by Truitt Merle, MD on 06/21/2016       Breast cancer of lower-inner quadrant of right female breast ([place])   02/27/2013 Mammogram    Diagnostic mammogram and ultrasound showed a 6 x 5 mm mass at 5:00 position. Axilla was negative by ultrasound.      03/16/2013 Initial Biopsy    Right breast 5:00 position biopsy showed invasive ductal carcinoma, low-grade      03/16/2013 Receptors her2    ER100% positive, PR 100% positive, HER-2 negative, Ki-67 20%      03/18/2013 Initial Diagnosis    Breast cancer of lower-inner quadrant of right female breast ([place])     05/12/2013 Surgery    Right breast lumpectomy with sentinel lymph node biopsy      05/12/2013 Pathology Results    Right breast lumpectomy showed invasive ductal carcinoma, grade 2, 0.9 cm, 6 sentinel lymph nodes were all negative. Margins were negative.      05/12/2013 Oncotype testing    Oncotype recurrence score 19, intermediate risk, 10 year risk of distant recurrence 12% with tamoxifen for 5 years. Patient declined adjuvant chemotherapy.      06/02/2013 - 07/16/2013 Radiation Therapy    Adjuvant breast radiation with boost 1600Gy      08/06/2013 -  Anti-estrogen oral therapy    Arimidex 1 mg daily        CURRENT THERAPY: Anastrzole 1 mg daily  since 07/2013   INTERVAL HISTORY:  [name] reports to clinic today for follow-up. She was previously under Dr. Mariana Kaufman care, who has recently retired. I last saw her 6 months ago.  She notes no new concerns. She still has back pain so she will get another epidural injection soon through her orthopedist. This has helped her before. She is still able to move as needed.  Her hot flashes have worsened with wet sheets and wakes her up at night. She denies any mood swings or joint pain or stiffness. She would like to use medication for her hot flashes. She does take Gabapentin once at night already for her gout. She is fine with increasing medication to help with hot flashes. She would like to get flu vaccination today.    REVIEW OF SYSTEMS:   Constitutional: Denies fevers, chills or abnormal weight loss (+) sever hot flashes  Eyes: Denies blurriness of vision Ears, nose, mouth, throat, and face: Denies mucositis or sore throat Respiratory: Denies cough, dyspnea or wheezes Cardiovascular: Denies palpitation, chest discomfort or lower extremity swelling Gastrointestinal:  Denies nausea, heartburn or change in bowel habits Skin: Denies abnormal skin rashes Lymphatics: Denies new lymphadenopathy or easy bruising Neurological:Denies numbness, tingling or new weaknesses Behavioral/Psych: Mood is stable, no new changes  All other systems were reviewed with the patient and are negative. Musculoskeletal:  (+) Chronic back pain  MEDICAL HISTORY:  Past Medical History:  Diagnosis Date  .  Abdominal discomfort    Chronic N/V/D. Presumptive dx Crohn's dx per elevated p ANCA. Failed Entocort and Pentasa. Sep 2003 - ileocolectomy c anastomosis per Dr Deon Pilling 2/2 adhesions - path was hegative for Crohns. EGD, Sm bowel follow through (11/03), and an eteroclysis (10/03) were unrevealing. Cuases hypomag and hypocalcemia.  . Adnexal mass 8/03   s/p lap BSO (R ovarian fibroma) & lysis of adhesions  .  Allergy    Seasonal  . Anemia    Multifactorial. Baseline HgB 10-11 ish. B12 def - 150 in 3/10. Fe Def - ferritin 35 3/10. Both are being repleted.  . Breast cancer ([place]) 03/16/13   right, 5 o'clock  . CAD (coronary artery disease) 1996   1996 - PTCA and angioplasty diagonal branch. 2000 - Rotoblator & angiopllasty of diagonal. 2006 - subendocardial AMI, DES to proximal LAD.Marland Kitchen Also had 90% stenosis in distal apical LAD. EF 55 with apical hypokinesis. Indefinite ASA and Plavix.  . CHF (congestive heart failure) ([place])   . Chronic kidney disease    Chronic renal insuff baseline Cr 1.2 - 1.4 ish.  . Chronic pain    CT 10/10 = Spinal stenosis L2 - S1.  . Diabetes mellitus    Insulin dependent  . Hx of radiation therapy 06/02/13- 07/16/13   right rbeast 4500 cGy 25 sessions, right breast boost 1600 cGy in 8 sessions  . Hyperlipidemia    Managed with both a statin and Welchol. Welchol stopped 2014 2/2 cost and started on fenofibrate   . Hypertension    2006 B renal arteries patent. 2003 MRA - no RAS. 2003 pheo W/U Dr Hassell Done reportedly negative.  . Lupus   . RBBB   . Stroke [facility])    Incidental finding MRI 2002 L lacunar infarct  . Wears dentures    top    SURGICAL HISTORY: Past Surgical History:  Procedure Laterality Date  . ABDOMINAL HYSTERECTOMY    . BILATERAL SALPINGOOPHORECTOMY  8/03   Lap BSO (R ovarian fibroma) and adhesion lysis  . BOWEL RESECTION  2003   ileocolectomy with anastomosis 2/2 adhesions  . BREAST LUMPECTOMY WITH NEEDLE LOCALIZATION AND AXILLARY SENTINEL LYMPH NODE BX Right 04/22/2013   Procedure: BREAST LUMPECTOMY WITH NEEDLE LOCALIZATION AND AXILLARY SENTINEL LYMPH NODE BX;  Surgeon: Stark Klein, MD;  Location: [facility];  Service: General;  Laterality: Right;  . CARDIAC CATHETERIZATION     2 stents  . CHOLECYSTECTOMY    . HEMICOLECTOMY     R sided hemicolectomy  . HERNIA REPAIR     Ventral hernia repair  . 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  ALLERGIES:  is allergic to percocet [oxycodone-acetaminophen]; diazepam; haloperidol lactate; lorazepam; morphine sulfate; propoxyphene hcl; and tramadol hcl.  MEDICATIONS:  Current Outpatient Prescriptions  Medication Sig Dispense Refill  . acetaminophen (TYLENOL) 325 MG tablet Take 650 mg by mouth every 6 (six) hours as needed for mild pain.    Marland Kitchen anastrozole (ARIMIDEX) 1 MG tablet Take 1 tablet (1 mg total) by mouth daily. 90 tablet 3  . aspirin EC 81 MG tablet Take 81 mg by mouth daily.    Marland Kitchen atorvastatin (LIPITOR) 20 MG tablet TAKE 1 TABLET BY MOUTH AT  BEDTIME 90 tablet 3  . carvedilol (COREG) 6.25 MG tablet Take 6.25 mg by mouth 2 (two) times daily.    Marland Kitchen  clopidogrel (PLAVIX) 75 MG tablet TAKE 1 TABLET BY MOUTH ONCE A DAY 90 tablet 3  . cyanocobalamin (,VITAMIN B-12,) 1000 MCG/ML injection Inject 1 mL (1,000 mcg total) into the muscle every 30 (thirty) days. 1 mL 11  . desloratadine (CLARINEX) 5 MG tablet Take 5 mg by mouth at bedtime as needed (allergies). Reported on 04/11/2015    . fenofibrate 54 MG tablet Take 54 mg by mouth daily.     . ferrous fumarate (HEMOCYTE - 106 MG FE) 325 (106 Fe) MG TABS tablet Take 1 tablet (106 mg of iron total) by mouth daily. 30 each 0  . furosemide (LASIX) 40 MG tablet Take 1 tablet (40 mg total) by mouth 2 (two) times daily. 180 tablet 2  . gabapentin (NEURONTIN) 300 MG capsule TAKE 1 CAPSULE BY MOUTH AT  BEDTIME 90 capsule 3  . hydrOXYzine (VISTARIL) 50 MG capsule TAKE 1 CAPSULE BY MOUTH ONCE DAILY AT BEDTIME AS NEEDED 90 capsule 3  . Insulin Detemir (LEVEMIR FLEXTOUCH) 100 UNIT/ML Pen Inject 36 UNITS SUBCUTANEOUSLY at the same time each day 15 mL 11  . Insulin Pen Needle (B-D UF III MINI PEN NEEDLES) 31G X 5 MM MISC Use to inject insulin one time a day 100 each 3  . loperamide (IMODIUM) 2 MG capsule TAKE ONE CAPSULE BY MOUTH  ONCE DAILY AS NEEDED FOR DIARRHEA OR LOOSE STOOLS 30 capsule 2  . losartan (COZAAR) 50 MG tablet Take 1 tablet (50 mg total) by mouth daily. 90 tablet 3  . nitroGLYCERIN (NITROSTAT) 0.4 MG SL tablet Place 1 tablet (0.4 mg total) under the tongue every 5 (five) minutes as needed for chest pain. 25 tablet 3  . ONE TOUCH ULTRA TEST test strip USE TO CHECK BLOOD SUGAR 3  TO 4 TIMES DAILY 400 each 3  . potassium chloride (K-DUR) 10 MEQ tablet Take 2 tablets (20 mEq total) by mouth daily. 180 tablet 3  . repaglinide (PRANDIN) 2 MG tablet TAKE 1 TABLET BY MOUTH 3  TIMES A DAY BEFORE MEALS 270 tablet 3  . ULORIC 40 MG tablet Take 1 tablet by mouth daily.     No current facility-administered medications for this visit.    Facility-Administered Medications Ordered in Other Visits  Medication Dose Route Frequency Provider Last Rate Last Dose  . 0.9 %  sodium chloride infusion   Intravenous Continuous Burgess Estelle, MD 150 mL/hr at 10/03/15 1607      PHYSICAL EXAMINATION:  ECOG PERFORMANCE STATUS:1  Vitals:   12/19/16 1512  BP: (!) 144/60  Pulse: 80  Resp: 18  Temp: 98.6 F (37 C)  SpO2: 100%   Filed Weights   12/19/16 1512  Weight: 187 lb 9.6 oz (85.1 kg)    GENERAL:alert, no distress and comfortable SKIN: skin color, texture, turgor are normal, no rashes or significant lesions EYES: normal, Conjunctiva are pink and non-injected, sclera clear OROPHARYNX:no exudate, no erythema and lips, buccal mucosa, and tongue normal  NECK: supple, thyroid normal size, non-tender, without nodularity LYMPH:  no palpable lymphadenopathy in the cervical, axillary or inguinal LUNGS: clear to auscultation and percussion with normal breathing effort HEART: regular rate & rhythm and no murmurs and no lower extremity edema ABDOMEN:abdomen soft, non-tender and normal bowel sounds Musculoskeletal:no cyanosis of digits and no clubbing  NEURO: alert & oriented x 3 with fluent speech, no focal motor/sensory  deficits BREAST: (+) diffused skin pigmentation of R breast with mild lymphedema,  and firmness in the lower outer quadrant of right  breast, slightly tender. No discrete mass. Exam of the left breast and bilateral axilla were negative.  LABORATORY DATA:  I have reviewed the data as listed CBC Latest Ref Rng & Units 12/19/2016 10/11/2016 07/20/2016  WBC 3.9 - 10.3 10e3/uL 9.9 8.5 7.7  Hemoglobin 11.6 - 15.9 g/dL 8.9(L) 9.1(L) 8.6(L)  Hematocrit 34.8 - 46.6 % 27.6(L) 28.2(L) 27.3(L)  Platelets 145 - 400 10e3/uL 312 351 301     CMP Latest Ref Rng & Units 12/19/2016 10/16/2016 10/11/2016  Glucose 70 - 140 mg/dl 91 151(H) 99  BUN 7.0 - 26.0 mg/dL 65.6(H) 44(H) 50(H)  Creatinine 0.6 - 1.1 mg/dL 2.3(H) 2.02(H) 2.05(H)  Sodium 136 - 145 mEq/L 140 141 140  Potassium 3.5 - 5.1 mEq/L 4.0 4.2 4.0  Chloride 96 - 106 mmol/L - 99 105  CO2 22 - 29 mEq/L _0 Calcium 8.4 - 10.4 mg/dL 10.0 10.0 9.8  Total Protein 6.4 - 8.3 g/dL 8.1 - 8.5(H)  Total Bilirubin 0.20 - 1.20 mg/dL 0.26 - 0.5  Alkaline Phos 40 - 150 U/L 54 - 52  AST 5 - 34 U/L 10 - 18  ALT 0 - 55 U/L 10 - 16   PROCURES  ECHO 10/26/16 Study Conclusions - Left ventricle: The cavity size was normal. Wall thickness was   increased in a pattern of moderate LVH. Systolic function was   normal. The estimated ejection fraction was in the range of 60%   to 65%. Wall motion was normal; there were no regional wall   motion abnormalities. Normal GLPSS at -23%. Doppler parameters   are consistent with abnormal left ventricular relaxation (grade 1   diastolic dysfunction). The Ee&' ratio is between 8-15, suggesting   indeterminate LV filling pressure. - Mitral valve: Mildly thickened leaflets . There was trivial   regurgitation. - Left atrium: The atrium was normal in size. - Tricuspid valve: There was mild regurgitation. - Pulmonary arteries: PA peak pressure: 42 mm Hg (S). - Inferior vena cava: The vessel was normal in size. The    respirophasic diameter changes were in the normal range (>= 50%),   consistent with normal central venous pressure. Impressions: - Compared to a prior study in 2012, the LVEF is stable. The RVSP   is now higher at 42 mmHg (compared to 32 mmHg previously).   RADIOGRAPHIC STUDIES: I have personally reviewed the radiological images as listed and agreed with the findings in the report. No results found.    ASSESSMENT & PLAN: 72 y.o. African-American female, post menopause  1.  Breast cancer of the lower inner quadrant of the right breast, invasive ductal carcinoma with DCIS, pT1bN0Mo, stage IA,   ER+ and PR +, HER 2 negative -I have reviewed her medical records extensively, and confirmed acute findings with patient.  -She has had very early stage breast cancer, and complete surgical resection, her risk of recurrence is low based on the Oncotype.  -She is clinically doing well, no clinical concern for recurrence. -Lab results reviewed with her, She has worsened anemia, hg at 8.9. Exam was unremarkable except the fullness in the outer lower quadrant of right breast. 2018 Breast MRI showed post surgical change and no evidence of suspicious mass or enhancement  - 2018 Mammogram/2017 bone density scans were normal -Will have mammogram and bone density in 2019. Will order at next visit. - f/u in 6 months  -She opted for Flu shot today   2. Anemia of chronic disease (CKD) - present back to  2012. Appears secondary from CKD and other chronic diseases by evaluation as noted and seems asymptomatic.  - hemoglobin 9.6 previously, overall trending down  -I previously encouraged her to continue daily iron supplement - I have previously encouraged her to eat more iron rich foods and take multivitamins - Patient receives a B12 shot every month at PCP's office - I will check iron levels every 6 months -Her anemia slightly worse, Hg at 8.9 today (12/19/16). If her levels remain low or worsen she may need  IV iron. She will follow up with Nephrologist, Dr. Hollie Salk. I discussed the role of EPO in anemia from CKD I recommend her to start if her Hb persistently below 9.0   3. Low Back, right hip and RLE pain/stiffness from degenerative arthritis and spinal stenosis - f/u with Orthopedics  -Since her back pain persists she will get a epidural injection to help with the pain  4. DM - manged by PCP - I encouraged her to exercise to help with her DM - I strongly advised the patient to watch her BP and glucose levels.  5. Chronic renal insufficieny, CKD III  - Lab review, creatinine 1.8 previously (07/19/16) -Cr. Increased to 2.3 today and BUN at 65.6 (12/19/16), She sees Dr. Hollie Salk every 3 months and will f/u for her kidney function.   6. CAD, HTN - followed by Dr. Johnsie Cancel   7. Genetics - Patient has multiple family members who have/had cancer - She has had genetic testing which came back normal  8. Hot Flashes -They have worsened lately -She already takes Neurontin once (344m) at night for gout. I suggest increasing to two capsule at night and as needed in the morning. If that does not help I will call in SSRI for her hot flush.    PLAN  -Increase Neurontin to 6081mat night and  3002mn the morning as needed for hot flush.  -she will discuss with her nephrologist Dr. UptHollie Salkout EPO for her anemia  -Lab and f/u in 6 months  -Flu vaccination today    No problem-specific Assessment & Plan notes found for this encounter.   No orders of the defined types were placed in this encounter.  All questions were answered. The patient knows to call the clinic with any problems, questions or concerns. No barriers to learning was detected.  I spent 25 minutes counseling the patient face to face. The total time spent in the appointment was 30 minutes and more than 50% was on counseling and review of test results  This document serves as a record of services personally performed by YanTruitt MerleD. It  was created on her behalf by AmoJoslyn Devon trained medical scribe. The creation of this record is based on the scribe's personal observations and the provider's statements to them. This document has been checked and approved by the attending provider.    FenTruitt MerleD 12/19/2016

## 2016-12-19 ENCOUNTER — Encounter: Payer: Self-pay | Admitting: Hematology

## 2016-12-19 ENCOUNTER — Telehealth: Payer: Self-pay | Admitting: Hematology

## 2016-12-19 ENCOUNTER — Other Ambulatory Visit (HOSPITAL_BASED_OUTPATIENT_CLINIC_OR_DEPARTMENT_OTHER): Payer: Medicare Other

## 2016-12-19 ENCOUNTER — Ambulatory Visit (HOSPITAL_BASED_OUTPATIENT_CLINIC_OR_DEPARTMENT_OTHER): Payer: Medicare Other | Admitting: Hematology

## 2016-12-19 VITALS — BP 144/60 | HR 80 | Temp 98.6°F | Resp 18 | Ht 65.0 in | Wt 187.6 lb

## 2016-12-19 DIAGNOSIS — Z853 Personal history of malignant neoplasm of breast: Secondary | ICD-10-CM

## 2016-12-19 DIAGNOSIS — I10 Essential (primary) hypertension: Secondary | ICD-10-CM | POA: Diagnosis not present

## 2016-12-19 DIAGNOSIS — M545 Low back pain: Secondary | ICD-10-CM

## 2016-12-19 DIAGNOSIS — D638 Anemia in other chronic diseases classified elsewhere: Secondary | ICD-10-CM

## 2016-12-19 DIAGNOSIS — N189 Chronic kidney disease, unspecified: Secondary | ICD-10-CM

## 2016-12-19 DIAGNOSIS — C50311 Malignant neoplasm of lower-inner quadrant of right female breast: Secondary | ICD-10-CM | POA: Diagnosis not present

## 2016-12-19 DIAGNOSIS — N183 Chronic kidney disease, stage 3 (moderate): Secondary | ICD-10-CM

## 2016-12-19 DIAGNOSIS — N951 Menopausal and female climacteric states: Secondary | ICD-10-CM

## 2016-12-19 DIAGNOSIS — N184 Chronic kidney disease, stage 4 (severe): Secondary | ICD-10-CM

## 2016-12-19 DIAGNOSIS — Z23 Encounter for immunization: Secondary | ICD-10-CM

## 2016-12-19 DIAGNOSIS — Z170 Estrogen receptor positive status [ER+]: Secondary | ICD-10-CM | POA: Diagnosis not present

## 2016-12-19 DIAGNOSIS — D631 Anemia in chronic kidney disease: Secondary | ICD-10-CM | POA: Diagnosis not present

## 2016-12-19 DIAGNOSIS — M109 Gout, unspecified: Secondary | ICD-10-CM

## 2016-12-19 DIAGNOSIS — E119 Type 2 diabetes mellitus without complications: Secondary | ICD-10-CM | POA: Diagnosis not present

## 2016-12-19 DIAGNOSIS — D649 Anemia, unspecified: Secondary | ICD-10-CM | POA: Diagnosis not present

## 2016-12-19 LAB — CBC WITH DIFFERENTIAL/PLATELET
BASO%: 0.1 % (ref 0.0–2.0)
BASOS ABS: 0 10*3/uL (ref 0.0–0.1)
EOS%: 1.3 % (ref 0.0–7.0)
Eosinophils Absolute: 0.1 10*3/uL (ref 0.0–0.5)
HEMATOCRIT: 27.6 % — AB (ref 34.8–46.6)
HEMOGLOBIN: 8.9 g/dL — AB (ref 11.6–15.9)
LYMPH%: 22.7 % (ref 14.0–49.7)
MCH: 25.5 pg (ref 25.1–34.0)
MCHC: 32.2 g/dL (ref 31.5–36.0)
MCV: 79.1 fL — AB (ref 79.5–101.0)
MONO#: 0.4 10*3/uL (ref 0.1–0.9)
MONO%: 4.5 % (ref 0.0–14.0)
NEUT%: 71.4 % (ref 38.4–76.8)
NEUTROS ABS: 7.1 10*3/uL — AB (ref 1.5–6.5)
PLATELETS: 312 10*3/uL (ref 145–400)
RBC: 3.49 10*6/uL — ABNORMAL LOW (ref 3.70–5.45)
RDW: 14.8 % — ABNORMAL HIGH (ref 11.2–14.5)
WBC: 9.9 10*3/uL (ref 3.9–10.3)
lymph#: 2.2 10*3/uL (ref 0.9–3.3)

## 2016-12-19 LAB — COMPREHENSIVE METABOLIC PANEL
ALBUMIN: 3.6 g/dL (ref 3.5–5.0)
ALK PHOS: 54 U/L (ref 40–150)
ALT: 10 U/L (ref 0–55)
ANION GAP: 12 meq/L — AB (ref 3–11)
AST: 10 U/L (ref 5–34)
BILIRUBIN TOTAL: 0.26 mg/dL (ref 0.20–1.20)
BUN: 65.6 mg/dL — ABNORMAL HIGH (ref 7.0–26.0)
CALCIUM: 10 mg/dL (ref 8.4–10.4)
CO2: 24 mEq/L (ref 22–29)
Chloride: 104 mEq/L (ref 98–109)
Creatinine: 2.3 mg/dL — ABNORMAL HIGH (ref 0.6–1.1)
EGFR: 25 mL/min/{1.73_m2} — AB (ref >90)
GLUCOSE: 91 mg/dL (ref 70–140)
Potassium: 4 mEq/L (ref 3.5–5.1)
SODIUM: 140 meq/L (ref 136–145)
TOTAL PROTEIN: 8.1 g/dL (ref 6.4–8.3)

## 2016-12-19 MED ORDER — INFLUENZA VAC SPLIT QUAD 0.5 ML IM SUSY
0.5000 mL | PREFILLED_SYRINGE | Freq: Once | INTRAMUSCULAR | Status: AC
  Administered 2016-12-19: 0.5 mL via INTRAMUSCULAR
  Filled 2016-12-19: qty 0.5

## 2016-12-19 MED ORDER — ANASTROZOLE 1 MG PO TABS: 1.0000 mg | ORAL_TABLET | Freq: Every day | ORAL | 3 refills | Status: DC

## 2016-12-19 NOTE — Telephone Encounter (Signed)
Called to follow up on blood sugars. She took herself of the prednisone and blood sugars went back to her usual mostly under 200 mg/dl. "I couln't do all that" . She reports her back is still painful and that she is getting an epidural next week that she hopes will help. Encouraged her to call the office anytime she is put on steroids.

## 2016-12-19 NOTE — Telephone Encounter (Signed)
Scheduled appt per 10/3 los - Gave patient AVS and calender per los. Lab and f/u in 6 months.

## 2016-12-20 ENCOUNTER — Ambulatory Visit (INDEPENDENT_AMBULATORY_CARE_PROVIDER_SITE_OTHER): Payer: Medicare Other | Admitting: Internal Medicine

## 2016-12-20 ENCOUNTER — Encounter: Payer: Self-pay | Admitting: Internal Medicine

## 2016-12-20 VITALS — BP 133/56 | HR 76 | Temp 98.6°F | Wt 186.2 lb

## 2016-12-20 DIAGNOSIS — N184 Chronic kidney disease, stage 4 (severe): Secondary | ICD-10-CM

## 2016-12-20 DIAGNOSIS — R0601 Orthopnea: Secondary | ICD-10-CM

## 2016-12-20 DIAGNOSIS — D631 Anemia in chronic kidney disease: Secondary | ICD-10-CM

## 2016-12-20 DIAGNOSIS — E1142 Type 2 diabetes mellitus with diabetic polyneuropathy: Secondary | ICD-10-CM

## 2016-12-20 DIAGNOSIS — E785 Hyperlipidemia, unspecified: Secondary | ICD-10-CM | POA: Diagnosis not present

## 2016-12-20 DIAGNOSIS — Z170 Estrogen receptor positive status [ER+]: Secondary | ICD-10-CM

## 2016-12-20 DIAGNOSIS — I129 Hypertensive chronic kidney disease with stage 1 through stage 4 chronic kidney disease, or unspecified chronic kidney disease: Secondary | ICD-10-CM | POA: Diagnosis not present

## 2016-12-20 DIAGNOSIS — C50311 Malignant neoplasm of lower-inner quadrant of right female breast: Secondary | ICD-10-CM | POA: Diagnosis not present

## 2016-12-20 DIAGNOSIS — Z8673 Personal history of transient ischemic attack (TIA), and cerebral infarction without residual deficits: Secondary | ICD-10-CM

## 2016-12-20 DIAGNOSIS — N183 Chronic kidney disease, stage 3 unspecified: Secondary | ICD-10-CM

## 2016-12-20 DIAGNOSIS — E1169 Type 2 diabetes mellitus with other specified complication: Secondary | ICD-10-CM

## 2016-12-20 DIAGNOSIS — E1129 Type 2 diabetes mellitus with other diabetic kidney complication: Secondary | ICD-10-CM

## 2016-12-20 DIAGNOSIS — E1165 Type 2 diabetes mellitus with hyperglycemia: Secondary | ICD-10-CM

## 2016-12-20 DIAGNOSIS — I872 Venous insufficiency (chronic) (peripheral): Secondary | ICD-10-CM | POA: Diagnosis not present

## 2016-12-20 DIAGNOSIS — I10 Essential (primary) hypertension: Secondary | ICD-10-CM

## 2016-12-20 DIAGNOSIS — IMO0002 Reserved for concepts with insufficient information to code with codable children: Secondary | ICD-10-CM

## 2016-12-20 DIAGNOSIS — E1122 Type 2 diabetes mellitus with diabetic chronic kidney disease: Secondary | ICD-10-CM

## 2016-12-20 LAB — IRON AND TIBC
%SAT: 15 % — ABNORMAL LOW (ref 21–57)
Iron: 68 ug/dL (ref 41–142)
TIBC: 453 ug/dL — AB (ref 236–444)
UIBC: 385 ug/dL — AB (ref 120–384)

## 2016-12-20 LAB — FERRITIN: Ferritin: 83 ng/ml (ref 9–269)

## 2016-12-20 LAB — GLUCOSE, CAPILLARY: GLUCOSE-CAPILLARY: 194 mg/dL — AB (ref 65–99)

## 2016-12-20 LAB — POCT GLYCOSYLATED HEMOGLOBIN (HGB A1C): Hemoglobin A1C: 8.5

## 2016-12-20 MED ORDER — GABAPENTIN 300 MG PO CAPS
ORAL_CAPSULE | ORAL | 3 refills | Status: DC
Start: 2016-12-20 — End: 2017-08-22

## 2016-12-20 MED ORDER — CYANOCOBALAMIN 1000 MCG/ML IJ SOLN
1000.0000 ug | Freq: Once | INTRAMUSCULAR | Status: AC
Start: 2016-12-20 — End: 2016-12-20
  Administered 2016-12-20: 1000 ug via INTRAMUSCULAR

## 2016-12-20 MED ORDER — FUROSEMIDE 40 MG PO TABS: 80.0000 mg | ORAL_TABLET | Freq: Every day | ORAL | 2 refills | Status: DC

## 2016-12-20 NOTE — Assessment & Plan Note (Signed)
See orthopnea

## 2016-12-20 NOTE — Assessment & Plan Note (Signed)
This problem is chronic and stable. She is on Lipitor 20, moderate intensity statin. Her LDL in May 2017 was 88. Due to the possible TIA/CVA that she had earlier this month and did not seek medical attention, I will discuss increasing her Lipitor to 40 mg which is a high intensity statin at her next appointment.  PLAN : Continue Lipitor 20 Discussed Lipitor 40 at her next appointment

## 2016-12-20 NOTE — Assessment & Plan Note (Signed)
This problem is chronic and stable. Her A1c trend has been 7.7 - 8.2 - 8.0 - 8.5. She is on Levemir 36 and Prandin 2 mg 3 times a day. She denies any hypoglycemia. We reviewed her CBG download report. She had been on steroids from about September 26 until about the 29th. During those days, she had hyperglycemia up to the 400s. Since she stopped the prednisone, her fasting glucoses have ranged between 76 and 185. Prior to the prednisone, her CBGs have ranged from 106-193. She mostly checks her sugar once a day but occasionally may check it later in the day if she feels that it is high or low. The recent hyperglycemia is going to significantly affect her A1c today. That reason along with a great variation in her fasting CBGs, is making the hesitant to give any significant medication changes. I cannot increase her Levemir as that would cause an occasional hypoglycemia during the night or early morning. We decided to increase her Prandin from 2 mg to 4 mg whenever she has a full meal. Sometimes, she does not eat a full meal and only snacks. Those meals, she will continue the 2 mg of Prandin.  PLAN:  Cont current Levemir dose Prandin 4 mg prior to a large meal Prandin 2 mg prior to a small meal She is going to contact her ophthalmologist to see her most recent diabetic eye exam

## 2016-12-20 NOTE — Assessment & Plan Note (Signed)
This problem is chronic and stable. She is on Cozaar 50, Coreg 6.25, and Lasix 80 mg in the morning. Her amlodipine was stopped due to lower extremity edema and she has noticed minimal improvement. She has no side effects to these medications. Her blood pressure is almost at goal. If she continues to have a systolic blood pressure greater then 130, I can increase her Cozaar further at her next appointment. I am concerned about her rising creatinine and we discussed that her Lasix may need to be adjusted if her creatinine continues to rise.  PLAN:  Cont current meds   BP Readings from Last 3 Encounters:  12/20/16 (!) 133/56  12/19/16 (!) 144/60  12/06/16 140/60

## 2016-12-20 NOTE — Assessment & Plan Note (Signed)
This problem is chronic and worsening. Her creatinine baseline now appears to be about 2 whereas before it had been about 1.3. I pulled up the graph of her creatinines and we reviewed it in depth. I also pulled up her nephrology note and we went over the recommendations. We reviewed that this is likely secondary to her diabetes and her hypertension. We reviewed that as long as her creatinine doesn't worsen, she can live the rest of her life without any difficulty with a creatinine 2. We discussed that we need to ensure her creatinine does not worsen. We discussed that good diabetic and hypertensive control can slow down progression of renal disease. I also printed off the most common nonsteroidals and advised complete cessation from all nonsteroidal. Since she is now on 80 mg of Lasix once a day since June, she will come in later this month to recheck her BMP to ensure her creatinine doesn't continue to elevate. Her next scheduled nephrology appointment is not until June 2019.  PLAN : Labs later this month BMP phos Vitamin D PTH mag

## 2016-12-20 NOTE — Assessment & Plan Note (Signed)
This problem is chronic and stable. In July she complained new orthopnea and lower extremity edema. Her orthopnea is stable and her lower extremity edema is slightly better. Her echo only showed grade 1 diastolic dysfunction with a normal EF. Her BNP was 57. Lower extremity Dopplers were negative for DVT bilaterally. Her microalbumin was 46. The only abnormality found was an elevated peak PA pressure of 42. She has continued on Lasix 80 mg once a day since July and does feel that this does better than the lower dose of 40 mg. However, her creatinine has increased and now has a new baseline somewhere around 2 although her creatinine yesterday was 2.3. Her creatinine does tend to bounce up and down. For now, I am leaving her on Lasix 80 once a day and she will return later this month to recheck her creatinine to ensure it is not trending up. As to the etiology of her lower extremity edema, is most likely chronic venous insufficiency. As to the etiology of her orthopnea, I have not had an etiology other than the single finding of an elevated peak PA pressure of 42. This is was not commented on by cardiology.  PLAN:  Cont current meds - Lasix 80 once a day

## 2016-12-20 NOTE — Patient Instructions (Signed)
1. Pls come for lab appt and TB test later this month 2. Pls see me in 3 months 3. Stop all Ibuprofen, aleve, motrin, naproxen, advil 4. Only OTC med OK for your kidneys is tylenol or acetaminophen.  3. Increase prandin to 2 pills before a full meal

## 2016-12-20 NOTE — Assessment & Plan Note (Signed)
This problem is chronic and stable. Her hemoglobin yesterday was 8.9 which is overall stable from 2011. Her MCV was elevated and she had an elevated RDW. She takes B12 daily and also gets monthly B12 shots and her 3 most recent B12 levels were above 1000. She had iron studies today which showed a ferritin in the 80s, elevated percent sat, and decreased TIBC. This is likely a combination of iron deficiency and chronic disease. She had iron on her medication list but stated she is not taking it. However, she just ordered some iron and plans to start taking once a day. She has seen nephrology in June who did not recommend any further interventions. She will see nephrology again in June 2019 unless her situation changes.  PLAN : Start oral iron Continue B12

## 2016-12-20 NOTE — Assessment & Plan Note (Signed)
This problem is new. She has a history of an incidental CVA found on MRI in 2002. She was asymptomatic at that time. She is on an aspirin, statin, and Plavix. She also has decent blood pressure control and diabetic control. Today, she shared with me that sometimes over the summer, she had slurred speech. She knew this because somebody was with her he was not able to understand her speech. She would not allow EMS to be called. The symptoms resolved in 15 minutes and have not reoccurred. We discussed the difference between a TIA and CVA. I stressed that if she had symptoms again suggestive of a TIA or stroke, but she come in because of new therapies that are time dependent. She is on dual antiplatelets and statin and we're actively managing her risk factors. I thought I might have heard an incredibly faint right carotid murmur but will reassess at her next appointment. She just had an echo. She might need a carotid Doppler but I will wait and reassess her in 3 months.  PLAN : Continue risk factor management Reassess right carotid at her next appointment To call 911 if symptoms recur

## 2016-12-20 NOTE — Assessment & Plan Note (Signed)
   She saw her oncologist yesterday and I reviewed the note. Oncology had ordered blood testing which showed a stable microcytic anemia with a low percent sat and elevated TIBC. She continues to take her tamoxifen.  PLAN : Follow Onc's notes

## 2016-12-20 NOTE — Progress Notes (Signed)
   Subjective:    Patient ID: [name], female    DOB: [birth year], 71 y.o.   MRN: [id number]  HPI  [name] is here for DM F/U. Please see the A&P for the status of the pt's chronic medical problems.  ROS : per ROS section and in problem oriented charting. All other systems are negative.  PMHx, Soc hx, and / or Fam hx : no longer doing zumba (2 months) 2/2 R LBP with radiculopathy. 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  Review of Systems  Eyes: Positive for pain.  Respiratory:       + orthopnea stable  Cardiovascular: Positive for leg swelling.  Endocrine:       No hypoglycemic sxs  Musculoskeletal: Positive for back pain.       Objective:   Physical Exam  Constitutional: She appears well-developed and well-nourished. No distress.  HENT:  Head: Normocephalic and atraumatic.  Right Ear: External ear normal.  Left Ear: External ear normal.  Nose: Nose normal.  Eyes: EOM are normal. Right eye exhibits no discharge. Left eye exhibits no discharge. No scleral icterus.  Sl injection B, slight localized edema mid L upper eyelid  Cardiovascular: Normal rate, regular rhythm and normal heart sounds.   No murmur heard. ? Very faint R carotid bruit  Pulmonary/Chest: Effort normal and breath sounds normal.  Musculoskeletal: She exhibits edema. She exhibits no tenderness or deformity.  Neurological: She is alert.  Skin: Skin is warm and dry. She is not diaphoretic.  Psychiatric: She has a normal mood and affect. Her behavior is normal. Thought content normal.      Assessment & Plan:

## 2016-12-20 NOTE — Assessment & Plan Note (Signed)
This problem is chronic and stable. She has been on gabapentin 300 daily at bedtime. Due to hot flashes, her oncologist requested that she increase her gabapentin to 600 at bedtime and 300 in the morning as needed. She has not yet done that as she had not gotten her new prescription. I sent in a new prescription for allotting the medication 3 times a day.  PLAN : Gabapentin 600 daily at bedtime Gabapentin 300 in the morning as needed for hot flashes

## 2016-12-21 ENCOUNTER — Ambulatory Visit: Payer: Medicare Other | Admitting: Hematology

## 2016-12-21 ENCOUNTER — Other Ambulatory Visit: Payer: Medicare Other

## 2016-12-25 ENCOUNTER — Other Ambulatory Visit: Payer: Self-pay | Admitting: Hematology

## 2016-12-25 ENCOUNTER — Telehealth: Payer: Self-pay | Admitting: Hematology

## 2016-12-25 DIAGNOSIS — D508 Other iron deficiency anemias: Secondary | ICD-10-CM

## 2016-12-25 DIAGNOSIS — M48061 Spinal stenosis, lumbar region without neurogenic claudication: Secondary | ICD-10-CM | POA: Diagnosis not present

## 2016-12-25 NOTE — Telephone Encounter (Signed)
I called pt and reviewed her lab results, including iron study. She has low transferrin saturation and high TIBC, consistent with mild iron deficiency. I recommend her to start OTC iron pill ferrous sulfate 1-2 tab daily, constipation reviewed. She agrees. I will schedule her to repeat lab in 2 months, and I will consider iv iron if her iron level does not improve.   Truitt Merle 12/25/2016

## 2016-12-27 ENCOUNTER — Telehealth: Payer: Self-pay | Admitting: Hematology

## 2016-12-27 NOTE — Telephone Encounter (Signed)
Scheduled appt per 10/09 sch message - patient is aware of appt date and time.

## 2017-01-01 DIAGNOSIS — Z853 Personal history of malignant neoplasm of breast: Secondary | ICD-10-CM | POA: Diagnosis not present

## 2017-01-02 ENCOUNTER — Ambulatory Visit (INDEPENDENT_AMBULATORY_CARE_PROVIDER_SITE_OTHER): Payer: Medicare Other | Admitting: *Deleted

## 2017-01-02 ENCOUNTER — Other Ambulatory Visit (INDEPENDENT_AMBULATORY_CARE_PROVIDER_SITE_OTHER): Payer: Medicare Other

## 2017-01-02 DIAGNOSIS — N183 Chronic kidney disease, stage 3 unspecified: Secondary | ICD-10-CM

## 2017-01-02 DIAGNOSIS — Z111 Encounter for screening for respiratory tuberculosis: Secondary | ICD-10-CM

## 2017-01-02 DIAGNOSIS — Z0000 Encounter for general adult medical examination without abnormal findings: Secondary | ICD-10-CM

## 2017-01-03 LAB — BMP8+ANION GAP
ANION GAP: 16 mmol/L (ref 10.0–18.0)
BUN/Creatinine Ratio: 25 (ref 12–28)
BUN: 52 mg/dL — AB (ref 8–27)
CHLORIDE: 100 mmol/L (ref 96–106)
CO2: 26 mmol/L (ref 20–29)
Calcium: 10.1 mg/dL (ref 8.7–10.3)
Creatinine, Ser: 2.09 mg/dL — ABNORMAL HIGH (ref 0.57–1.00)
GFR calc Af Amer: 27 mL/min/{1.73_m2} — ABNORMAL LOW (ref >59)
GFR calc non Af Amer: 24 mL/min/{1.73_m2} — ABNORMAL LOW (ref >59)
Glucose: 151 mg/dL — ABNORMAL HIGH (ref 65–99)
Potassium: 4.1 mmol/L (ref 3.5–5.2)
Sodium: 142 mmol/L (ref 134–144)

## 2017-01-03 LAB — PTH, INTACT AND CALCIUM
Calcium: 9.9 mg/dL (ref 8.7–10.3)
PTH: 27 pg/mL (ref 15–65)

## 2017-01-03 LAB — VITAMIN D 25 HYDROXY (VIT D DEFICIENCY, FRACTURES): Vit D, 25-Hydroxy: 19.4 ng/mL — ABNORMAL LOW (ref 30.0–100.0)

## 2017-01-03 LAB — MAGNESIUM: Magnesium: 1.6 mg/dL (ref 1.6–2.3)

## 2017-01-03 LAB — PHOSPHORUS: PHOSPHORUS: 3.6 mg/dL (ref 2.5–4.5)

## 2017-01-04 LAB — TB SKIN TEST
Induration: 0 mm
TB Skin Test: NEGATIVE

## 2017-01-08 ENCOUNTER — Encounter: Payer: Self-pay | Admitting: Internal Medicine

## 2017-01-11 ENCOUNTER — Telehealth: Payer: Self-pay | Admitting: *Deleted

## 2017-01-11 NOTE — Telephone Encounter (Signed)
LM to call us back- need TB skin test  Results. TB skin test done 01/02/17 (will need to document read results on 01/04/17)

## 2017-01-22 DIAGNOSIS — M48061 Spinal stenosis, lumbar region without neurogenic claudication: Secondary | ICD-10-CM | POA: Diagnosis not present

## 2017-02-12 DIAGNOSIS — I129 Hypertensive chronic kidney disease with stage 1 through stage 4 chronic kidney disease, or unspecified chronic kidney disease: Secondary | ICD-10-CM | POA: Diagnosis not present

## 2017-02-12 DIAGNOSIS — N2581 Secondary hyperparathyroidism of renal origin: Secondary | ICD-10-CM | POA: Diagnosis not present

## 2017-02-12 DIAGNOSIS — N183 Chronic kidney disease, stage 3 (moderate): Secondary | ICD-10-CM | POA: Diagnosis not present

## 2017-02-12 DIAGNOSIS — C50312 Malignant neoplasm of lower-inner quadrant of left female breast: Secondary | ICD-10-CM | POA: Diagnosis not present

## 2017-02-12 DIAGNOSIS — E1129 Type 2 diabetes mellitus with other diabetic kidney complication: Secondary | ICD-10-CM | POA: Diagnosis not present

## 2017-02-13 DIAGNOSIS — M48061 Spinal stenosis, lumbar region without neurogenic claudication: Secondary | ICD-10-CM | POA: Diagnosis not present

## 2017-02-26 ENCOUNTER — Other Ambulatory Visit: Payer: Medicare Other

## 2017-03-04 ENCOUNTER — Telehealth: Payer: Self-pay

## 2017-03-04 NOTE — Telephone Encounter (Signed)
Called and left a message to call back and r.s her missed 02/26/17 lab  Echo Propp

## 2017-03-06 ENCOUNTER — Encounter: Payer: Self-pay | Admitting: *Deleted

## 2017-03-13 ENCOUNTER — Telehealth: Payer: Self-pay | Admitting: Hematology

## 2017-03-13 ENCOUNTER — Other Ambulatory Visit: Payer: Self-pay | Admitting: General Surgery

## 2017-03-13 DIAGNOSIS — Z9889 Other specified postprocedural states: Secondary | ICD-10-CM

## 2017-03-13 NOTE — Telephone Encounter (Signed)
Patient called to reschedule lab missed

## 2017-03-14 ENCOUNTER — Other Ambulatory Visit (HOSPITAL_BASED_OUTPATIENT_CLINIC_OR_DEPARTMENT_OTHER): Payer: Medicare Other

## 2017-03-14 DIAGNOSIS — Z853 Personal history of malignant neoplasm of breast: Secondary | ICD-10-CM

## 2017-03-14 DIAGNOSIS — D649 Anemia, unspecified: Secondary | ICD-10-CM

## 2017-03-14 DIAGNOSIS — C50911 Malignant neoplasm of unspecified site of right female breast: Secondary | ICD-10-CM | POA: Diagnosis not present

## 2017-03-14 DIAGNOSIS — C50311 Malignant neoplasm of lower-inner quadrant of right female breast: Secondary | ICD-10-CM

## 2017-03-14 DIAGNOSIS — D508 Other iron deficiency anemias: Secondary | ICD-10-CM

## 2017-03-14 LAB — COMPREHENSIVE METABOLIC PANEL
ALK PHOS: 61 U/L (ref 40–150)
ALT: 17 U/L (ref 0–55)
ANION GAP: 10 meq/L (ref 3–11)
AST: 17 U/L (ref 5–34)
Albumin: 3.5 g/dL (ref 3.5–5.0)
BILIRUBIN TOTAL: 0.28 mg/dL (ref 0.20–1.20)
BUN: 35.6 mg/dL — AB (ref 7.0–26.0)
CALCIUM: 9.6 mg/dL (ref 8.4–10.4)
CHLORIDE: 104 meq/L (ref 98–109)
CO2: 27 mEq/L (ref 22–29)
CREATININE: 1.8 mg/dL — AB (ref 0.6–1.1)
EGFR: 33 mL/min/{1.73_m2} — ABNORMAL LOW (ref >60)
Glucose: 177 mg/dl — ABNORMAL HIGH (ref 70–140)
Potassium: 3.8 mEq/L (ref 3.5–5.1)
Sodium: 140 mEq/L (ref 136–145)
Total Protein: 7.8 g/dL (ref 6.4–8.3)

## 2017-03-14 LAB — IRON AND TIBC
%SAT: 13 % — AB (ref 21–57)
Iron: 53 ug/dL (ref 41–142)
TIBC: 418 ug/dL (ref 236–444)
UIBC: 364 ug/dL (ref 120–384)

## 2017-03-14 LAB — CBC WITH DIFFERENTIAL/PLATELET
BASO%: 0.3 % (ref 0.0–2.0)
Basophils Absolute: 0 10*3/uL (ref 0.0–0.1)
EOS ABS: 0.1 10*3/uL (ref 0.0–0.5)
EOS%: 1.3 % (ref 0.0–7.0)
HCT: 29.2 % — ABNORMAL LOW (ref 34.8–46.6)
HEMOGLOBIN: 9.3 g/dL — AB (ref 11.6–15.9)
LYMPH%: 22.2 % (ref 14.0–49.7)
MCH: 25.9 pg (ref 25.1–34.0)
MCHC: 31.8 g/dL (ref 31.5–36.0)
MCV: 81.3 fL (ref 79.5–101.0)
MONO#: 0.4 10*3/uL (ref 0.1–0.9)
MONO%: 6.4 % (ref 0.0–14.0)
NEUT%: 69.8 % (ref 38.4–76.8)
NEUTROS ABS: 4.3 10*3/uL (ref 1.5–6.5)
Platelets: 292 10*3/uL (ref 145–400)
RBC: 3.59 10*6/uL — AB (ref 3.70–5.45)
RDW: 14 % (ref 11.2–14.5)
WBC: 6.1 10*3/uL (ref 3.9–10.3)
lymph#: 1.4 10*3/uL (ref 0.9–3.3)

## 2017-03-14 LAB — FERRITIN: Ferritin: 52 ng/ml (ref 9–269)

## 2017-03-18 ENCOUNTER — Telehealth: Payer: Self-pay | Admitting: *Deleted

## 2017-03-18 NOTE — Telephone Encounter (Signed)
TCT patient regarding lab results from 03/14/17. No answer on identified phone. Was able to leave message for patient that anemia has resolved and iron levels are normal.  Advised pt she can increase her iron tablets by one a day if she can tolerate them. Advisd pt that she can call back @ [phone] if she has any questions or concerns.

## 2017-03-18 NOTE — Telephone Encounter (Signed)
-----   Message from Truitt Merle, MD sent at 03/16/2017 12:29 PM EST ----- Please let pt know her lab result, iron level and anemia improved since 2 month ago. I assume she is taking iron pill now, may increase by 1 tab a day if she can tolerate. Thanks   Truitt Merle  03/16/2017

## 2017-03-20 DIAGNOSIS — C50911 Malignant neoplasm of unspecified site of right female breast: Secondary | ICD-10-CM | POA: Diagnosis not present

## 2017-03-22 ENCOUNTER — Ambulatory Visit
Admission: RE | Admit: 2017-03-22 | Discharge: 2017-03-22 | Disposition: A | Discharge status: Home / Self Care | Payer: Medicare Other | Source: Ambulatory Visit | Attending: General Surgery | Admitting: General Surgery

## 2017-03-22 DIAGNOSIS — R928 Other abnormal and inconclusive findings on diagnostic imaging of breast: Secondary | ICD-10-CM | POA: Diagnosis not present

## 2017-03-22 DIAGNOSIS — Z9889 Other specified postprocedural states: Secondary | ICD-10-CM

## 2017-03-22 HISTORY — DX: Personal history of irradiation: Z92.3

## 2017-03-28 ENCOUNTER — Ambulatory Visit: Payer: Medicare Other | Admitting: Internal Medicine

## 2017-04-07 ENCOUNTER — Other Ambulatory Visit: Payer: Self-pay | Admitting: Internal Medicine

## 2017-04-09 MED ORDER — FUROSEMIDE 40 MG PO TABS: 80.0000 mg | ORAL_TABLET | Freq: Every day | ORAL | 3 refills | Status: DC

## 2017-04-09 NOTE — Telephone Encounter (Signed)
Tried mobile number - no answer. I had increased lasix from 20 BID to 61m tabs, 2 QAM (80 QAM) in July for vol overload. Is she still taking higher dose? Renal notes indicated 20 BID. Thanks

## 2017-04-18 ENCOUNTER — Ambulatory Visit (INDEPENDENT_AMBULATORY_CARE_PROVIDER_SITE_OTHER): Payer: Medicare Other | Admitting: Internal Medicine

## 2017-04-18 ENCOUNTER — Other Ambulatory Visit: Payer: Self-pay | Admitting: Pharmacist

## 2017-04-18 VITALS — BP 137/53 | HR 68 | Temp 98.0°F | Wt 188.6 lb

## 2017-04-18 DIAGNOSIS — E1169 Type 2 diabetes mellitus with other specified complication: Secondary | ICD-10-CM

## 2017-04-18 DIAGNOSIS — Z7902 Long term (current) use of antithrombotics/antiplatelets: Secondary | ICD-10-CM

## 2017-04-18 DIAGNOSIS — N183 Chronic kidney disease, stage 3 unspecified: Secondary | ICD-10-CM

## 2017-04-18 DIAGNOSIS — R42 Dizziness and giddiness: Secondary | ICD-10-CM

## 2017-04-18 DIAGNOSIS — Z8673 Personal history of transient ischemic attack (TIA), and cerebral infarction without residual deficits: Secondary | ICD-10-CM

## 2017-04-18 DIAGNOSIS — E1129 Type 2 diabetes mellitus with other diabetic kidney complication: Secondary | ICD-10-CM

## 2017-04-18 DIAGNOSIS — M5116 Intervertebral disc disorders with radiculopathy, lumbar region: Secondary | ICD-10-CM

## 2017-04-18 DIAGNOSIS — Z794 Long term (current) use of insulin: Secondary | ICD-10-CM

## 2017-04-18 DIAGNOSIS — I10 Essential (primary) hypertension: Secondary | ICD-10-CM

## 2017-04-18 DIAGNOSIS — D631 Anemia in chronic kidney disease: Secondary | ICD-10-CM | POA: Diagnosis not present

## 2017-04-18 DIAGNOSIS — I129 Hypertensive chronic kidney disease with stage 1 through stage 4 chronic kidney disease, or unspecified chronic kidney disease: Secondary | ICD-10-CM | POA: Diagnosis not present

## 2017-04-18 DIAGNOSIS — E1122 Type 2 diabetes mellitus with diabetic chronic kidney disease: Secondary | ICD-10-CM

## 2017-04-18 DIAGNOSIS — M549 Dorsalgia, unspecified: Secondary | ICD-10-CM

## 2017-04-18 DIAGNOSIS — Z7982 Long term (current) use of aspirin: Secondary | ICD-10-CM

## 2017-04-18 DIAGNOSIS — E1140 Type 2 diabetes mellitus with diabetic neuropathy, unspecified: Secondary | ICD-10-CM | POA: Diagnosis not present

## 2017-04-18 DIAGNOSIS — R0601 Orthopnea: Secondary | ICD-10-CM

## 2017-04-18 DIAGNOSIS — R609 Edema, unspecified: Secondary | ICD-10-CM | POA: Diagnosis not present

## 2017-04-18 DIAGNOSIS — Z79899 Other long term (current) drug therapy: Secondary | ICD-10-CM

## 2017-04-18 DIAGNOSIS — E785 Hyperlipidemia, unspecified: Secondary | ICD-10-CM

## 2017-04-18 DIAGNOSIS — IMO0002 Reserved for concepts with insufficient information to code with codable children: Secondary | ICD-10-CM

## 2017-04-18 DIAGNOSIS — E538 Deficiency of other specified B group vitamins: Secondary | ICD-10-CM | POA: Diagnosis not present

## 2017-04-18 DIAGNOSIS — G459 Transient cerebral ischemic attack, unspecified: Secondary | ICD-10-CM

## 2017-04-18 DIAGNOSIS — G8929 Other chronic pain: Secondary | ICD-10-CM | POA: Diagnosis not present

## 2017-04-18 DIAGNOSIS — N2581 Secondary hyperparathyroidism of renal origin: Secondary | ICD-10-CM | POA: Diagnosis not present

## 2017-04-18 DIAGNOSIS — N184 Chronic kidney disease, stage 4 (severe): Secondary | ICD-10-CM

## 2017-04-18 DIAGNOSIS — E1165 Type 2 diabetes mellitus with hyperglycemia: Principal | ICD-10-CM

## 2017-04-18 DIAGNOSIS — R011 Cardiac murmur, unspecified: Secondary | ICD-10-CM

## 2017-04-18 DIAGNOSIS — G894 Chronic pain syndrome: Secondary | ICD-10-CM

## 2017-04-18 LAB — GLUCOSE, CAPILLARY: GLUCOSE-CAPILLARY: 150 mg/dL — AB (ref 65–99)

## 2017-04-18 LAB — POCT GLYCOSYLATED HEMOGLOBIN (HGB A1C): HEMOGLOBIN A1C: 8.7

## 2017-04-18 MED ORDER — CYCLOBENZAPRINE HCL 5 MG PO TABS
5.0000 mg | ORAL_TABLET | Freq: Every evening | ORAL | 0 refills | Status: DC | PRN
Start: 2017-04-18 — End: 2017-06-16

## 2017-04-18 MED ORDER — ATORVASTATIN CALCIUM 40 MG PO TABS: 40.0000 mg | ORAL_TABLET | Freq: Every day | ORAL | 3 refills | Status: DC

## 2017-04-18 MED ORDER — LINAGLIPTIN 5 MG PO TABS: 5.0000 mg | ORAL_TABLET | Freq: Every day | ORAL | 0 refills | Status: DC

## 2017-04-18 MED ORDER — CYANOCOBALAMIN 1000 MCG/ML IJ SOLN
1000.0000 ug | Freq: Once | INTRAMUSCULAR | Status: AC
  Administered 2017-04-18: 1000 ug via INTRAMUSCULAR

## 2017-04-18 NOTE — Assessment & Plan Note (Signed)
This problem is chronic and uncontrolled. I added TIA to the overview. I am increasing the intensity of her statin. She is on aspirin and Plavix. We are working on better diabetes control and she has moderate hypertension control. Because of the right carotid bruit which I have heard for 2 exams now, I am getting a carotid Doppler. She had a recent echo.  PLAN : carotid doppler

## 2017-04-18 NOTE — Assessment & Plan Note (Signed)
This problem is chronic and uncontrolled. She is on Lipitor 20. She described TIA symptoms in October 2018. Therefore, I discussed increasing her statin intensity to 40 mg, a high potency statin. She is in agreement. I asked her to call me if she has any side effects to the higher dose. Her last LDL was 88 in May 2017.  PLAN Lipitor 40

## 2017-04-18 NOTE — Progress Notes (Signed)
   Subjective:    Patient ID: [name], female    DOB: [birth year], 72 y.o.   MRN: [id number]  HPI  [name] is here for edema F/U. Please see the A&P for the status of the pt's chronic medical problems.  ROS : per ROS section and in problem oriented charting. All other systems are negative.  PMHx, Soc hx, and / or Fam hx : New job since Oct working 10 AM to 7 PM. Interfering with ability to go to [employer]. Gets called in earlier sometimes.   Review of Systems  Constitutional: Positive for activity change. Negative for unexpected weight change.  Cardiovascular: Positive for leg swelling.       Stable 2 pillow orthopnea  Genitourinary: Positive for decreased urine volume.  Musculoskeletal: Positive for back pain.       R radicular leg pain  Neurological: Positive for dizziness and light-headedness.       Objective:   Physical Exam  Constitutional: She appears well-developed and well-nourished. No distress.  HENT:  Head: Normocephalic and atraumatic.  Right Ear: External ear normal.  Left Ear: External ear normal.  Nose: Nose normal.  Eyes: Conjunctivae and EOM are normal. Right eye exhibits no discharge. Left eye exhibits no discharge. No scleral icterus.  Cardiovascular: Normal rate, regular rhythm and normal heart sounds.  + carotid murmur R  Neurological: She is alert.  Skin: Skin is warm and dry. She is not diaphoretic.  Psychiatric: She has a normal mood and affect. Her behavior is normal. Judgment and thought content normal.      Assessment & Plan:

## 2017-04-18 NOTE — Progress Notes (Signed)
Linagliptin initiated $8 copay.

## 2017-04-18 NOTE — Assessment & Plan Note (Signed)
This problem is chronic and stable. Her regimen is Cozaar 50, Coreg 6.25, and Lasix 80 in the morning. Her blood pressure has improved but I would have room to lower it further with an increase in her Cozaar dose. I am not doing that today, as I am increasing her statin and starting a trial of Flexeril.   PLAN:  Cont current meds    BP Readings from Last 3 Encounters:  04/18/17 (!) 137/53  12/20/16 (!) 133/56  12/19/16 (!) 144/60

## 2017-04-18 NOTE — Assessment & Plan Note (Addendum)
His problem is chronic and worsened. Her A1c trend has been 8.0 - 8.5 - 8.7 today. Her regimen is Levemir 36 and Prandin 2 mg with a small meal and 4 mg with a big meal. She brought her meter today and is checking once a day in the morning fasting. They range from 100 up to the high 200s. This morning, she was 127. I am hesitant to increase her insulin as this would result an occasional hypoglycemia. I collaborated with our pharmacist and am starting linagliptide 5 mg PO QD. Dr Maudie Mercury found out $8 and sent it to her mail order pharmacy.   PLAN : Cont meds Add Linagliptide 5 QD F/U 3 months

## 2017-04-18 NOTE — Assessment & Plan Note (Signed)
This problem is chronic and stable. In October, I had increased her Lasix to 80 in the morning. This is due to an increase in edema and new onset orthopnea. The edema has improved. She has no edema in her ankles when she wakes up but develops edema at the end of the day. Occasionally her hands will be swollen. Her weight is stable. Despite the increase in her Lasix dose, her creatinine trended down just slightly. She states she is no longer urinating as much as she is used to with the Lasix. Her 2 pillow orthopnea is unchanged.  Despite her lack of response to Lasix reported, her weight is stable and her edema is only at the end of the day. Therefore, I think she is euvolemic today. Her creatinine tolerated the increase in the Lasix and her last creatinine check was December 27. I am not going to be rechecking her creatinine today as she is going to have close follow-up and can recheck it at her next appointment.  PLAN : cont lasix 80 QAM Close F/U BMP next appt

## 2017-04-18 NOTE — Assessment & Plan Note (Signed)
This problem is chronic and stable. She has multifactorial anemia. She has known B12 deficiency and we are giving her a B12 injection today.  PLAN : B12 injection

## 2017-04-18 NOTE — Assessment & Plan Note (Signed)
This problem is chronic and worsened. She is seeing Dr. Mina Marble for her herniated disc. It sounds like she was getting steroid nerve injections which initially worked but she has gotten no relief with the last 2 injections. She states his next recommendation would be surgery and she is understandably hesitant to undergo this.the gabapentin is helping her diabetic neuropathy but not helping the right radicular leg pain. It is interfering with her ability to do her job. She is also taking Tylenol which is not helping. She previously had been on Flexeril but her doctor took her off of it due to her age. She stated the Flexeril did help the radicular pain in her leg.  We discussed 3 options. First was her to resume the Flexeril with fall precautions. I stated we would start a lower dose due to her age. We discussed starting on a day off that she knows how she responds to it. Second was changing her gabapentin to duloxetine to treat both her neuropathy and her radicular pain. Third was to start an opioid.  She gave the decision to me and since I know she responds well to Flexeril, we are going to trial that for 1-2 weeks. I told her if she does not respond, to call me and I would be happy to stop the Flexeril and start duloxetine over the phone.  PLAN : cont Gaba 600 QHS Start flexeril 5 QHS PRN

## 2017-04-18 NOTE — Patient Instructions (Signed)
1. I will call you about new sugar medicine 2. I will schedule a follow up appt 3. Increase cholesterol medicine to pills once a day 4. Get the neck artery study (carotid dopplers) 5. Try the flexeril for your leg. Let me know if it doesn't work - call me

## 2017-04-18 NOTE — Assessment & Plan Note (Addendum)
This problem is chronic and uncontrolled. She sees Dr. Hollie Salk of [facility]. She sees her again in April. Her creatinine was 1.8 in December 2018. This was despite her higher Lasix dose of 80. Her blood pressure is moderate control. We are working to get better diabetic control. She knows to avoid nonsteroidal.  PLAN : follow

## 2017-04-18 NOTE — Assessment & Plan Note (Signed)
This problem is chronic and stable. It is being primarily managed by Dr. Hollie Salk. She has an appointment with her and April and I will review the notes at that time.  PLAN : F/U renal

## 2017-04-19 ENCOUNTER — Ambulatory Visit (HOSPITAL_COMMUNITY): Admission: RE | Admit: 2017-04-19 | Payer: Medicare Other | Source: Ambulatory Visit

## 2017-04-30 ENCOUNTER — Ambulatory Visit (HOSPITAL_COMMUNITY)
Admission: RE | Admit: 2017-04-30 | Discharge: 2017-04-30 | Disposition: A | Discharge status: Home / Self Care | Payer: Medicare Other | Source: Ambulatory Visit | Attending: Internal Medicine | Admitting: Internal Medicine

## 2017-04-30 ENCOUNTER — Encounter: Payer: Self-pay | Admitting: Internal Medicine

## 2017-04-30 DIAGNOSIS — G459 Transient cerebral ischemic attack, unspecified: Secondary | ICD-10-CM

## 2017-04-30 NOTE — Progress Notes (Signed)
Preliminary results by tech - Carotid Duplex Completed. No evidence of a significant stenosis in bilateral carotid arteries. Oda Cogan, BS, RDMS, RVT

## 2017-05-01 ENCOUNTER — Telehealth: Payer: Self-pay

## 2017-05-01 NOTE — Telephone Encounter (Signed)
Requesting lab results. Please call pt back.  

## 2017-05-01 NOTE — Telephone Encounter (Signed)
I had tried to call yesterday and again today but no answer. She is not allowed to accept calls at work so time to reach her limited. I mailed her a letter regarding her carotid dopplers yesterday. She should get it by Friday maybe? If she calls again, the arteries (blood vessels) in her neck have only minimal blockage / atherosclerosis. She does not need surgery or any new medicine. So overall a good test result.

## 2017-05-07 ENCOUNTER — Encounter (HOSPITAL_COMMUNITY): Payer: Medicare Other

## 2017-05-08 ENCOUNTER — Telehealth: Payer: Self-pay

## 2017-05-08 NOTE — Telephone Encounter (Signed)
Requesting to speak with Dr Lynnae January for test results. Per patient she would like Dr Lynnae January to call her daughter Hattie Perch [phone]) for the results. Also states she goes to Dr Johnsie Cancel for her heart.

## 2017-05-10 NOTE — Telephone Encounter (Signed)
done

## 2017-06-06 ENCOUNTER — Other Ambulatory Visit: Payer: Self-pay | Admitting: Internal Medicine

## 2017-06-06 DIAGNOSIS — E1129 Type 2 diabetes mellitus with other diabetic kidney complication: Secondary | ICD-10-CM

## 2017-06-06 DIAGNOSIS — E1165 Type 2 diabetes mellitus with hyperglycemia: Principal | ICD-10-CM

## 2017-06-06 DIAGNOSIS — IMO0002 Reserved for concepts with insufficient information to code with codable children: Secondary | ICD-10-CM

## 2017-06-10 ENCOUNTER — Other Ambulatory Visit: Payer: Self-pay | Admitting: Internal Medicine

## 2017-06-10 DIAGNOSIS — I890 Lymphedema, not elsewhere classified: Secondary | ICD-10-CM | POA: Diagnosis not present

## 2017-06-10 DIAGNOSIS — Z853 Personal history of malignant neoplasm of breast: Secondary | ICD-10-CM | POA: Diagnosis not present

## 2017-06-10 DIAGNOSIS — C50311 Malignant neoplasm of lower-inner quadrant of right female breast: Secondary | ICD-10-CM

## 2017-06-10 DIAGNOSIS — E2839 Other primary ovarian failure: Secondary | ICD-10-CM

## 2017-06-10 DIAGNOSIS — Z8673 Personal history of transient ischemic attack (TIA), and cerebral infarction without residual deficits: Secondary | ICD-10-CM | POA: Diagnosis not present

## 2017-06-16 ENCOUNTER — Other Ambulatory Visit: Payer: Self-pay | Admitting: Internal Medicine

## 2017-06-16 DIAGNOSIS — C50311 Malignant neoplasm of lower-inner quadrant of right female breast: Secondary | ICD-10-CM

## 2017-06-16 DIAGNOSIS — E2839 Other primary ovarian failure: Secondary | ICD-10-CM

## 2017-06-17 NOTE — Progress Notes (Signed)
[place]  [phone] [phone]  Clinic Follow up Note   Patient Care Team: Bartholomew Crews, MD as PCP - General Thelma Comp, [place] as Consulting Physician (Optometry) Stark Klein, MD as Consulting Physician (General Surgery) Truitt Merle, MD as Consulting Physician (Hematology) Madelon Lips, MD as Consulting Physician (Nephrology) 06/19/2017  SUMMARY OF ONCOLOGIC HISTORY: Oncology History   Cancer Staging Breast cancer of lower-inner quadrant of right female breast [facility]) Staging form: Breast, AJCC 7th Edition - Clinical: Stage IA (T1b, N0, cM0) - Unsigned Staging comments: Staged at breast conference 03/25/13.  - Pathologic stage from 05/12/2013: Stage IA (T1b, N0, cM0) - Signed by Truitt Merle, MD on 06/21/2016       Breast cancer of lower-inner quadrant of right female breast ([place])   02/27/2013 Mammogram    Diagnostic mammogram and ultrasound showed a 6 x 5 mm mass at 5:00 position. Axilla was negative by ultrasound.      03/16/2013 Initial Biopsy    Right breast 5:00 position biopsy showed invasive ductal carcinoma, low-grade      03/16/2013 Receptors her2    ER100% positive, PR 100% positive, HER-2 negative, Ki-67 20%      03/18/2013 Initial Diagnosis    Breast cancer of lower-inner quadrant of right female breast ([place])      05/12/2013 Surgery    Right breast lumpectomy with sentinel lymph node biopsy      05/12/2013 Pathology Results    Right breast lumpectomy showed invasive ductal carcinoma, grade 2, 0.9 cm, 6 sentinel lymph nodes were all negative. Margins were negative.      05/12/2013 Oncotype testing    Oncotype recurrence score 19, intermediate risk, 10 year risk of distant recurrence 12% with tamoxifen for 5 years. Patient declined adjuvant chemotherapy.      06/02/2013 - 07/16/2013 Radiation Therapy    Adjuvant breast radiation with boost 1600Gy      08/06/2013 -  Anti-estrogen oral therapy    Arimidex 1 mg  daily      03/22/2017 Mammogram    IMPRESSION: No mammographic evidence for malignancy.       CURRENT THERAPY: Anastrzole 1 mg daily since 07/2013  INTERVAL HISTORY:  [name] reports to clinic today for follow-up of her right breast cancer. She was previously under Dr. Mariana Kaufman care, who has recently retired. I last saw her 6 months ago. She presenets to the clinic today by herself. She reports she is doing well overall. She states that the only thing bothering her is some weakness in her right shoulder. She has mild decreased range of motion. She endorses good energy and appetite. She does feel tired though when she gets home from work.   She is compliant with Anastrozole and states her hot flashes are much better with increased dose of Neurontin..   On review of systems, pt denies arm swelling, back pain or any other complaints at this time. Pertinent positives are listed and detailed within the above HPI.   REVIEW OF SYSTEMS:   Constitutional: Denies fevers, chills or abnormal weight loss (+) good appetite, (+) hot flash improved Eyes: Denies blurriness of vision Ears, nose, mouth, throat, and face: Denies mucositis or sore throat Respiratory: Denies cough, dyspnea or wheezes Cardiovascular: Denies palpitation, chest discomfort or lower extremity swelling Gastrointestinal:  Denies nausea, heartburn or change in bowel habits Skin: Denies abnormal skin rashes Lymphatics: Denies new lymphadenopathy or easy bruising Neurological:Denies numbness, tingling or new weaknesses Behavioral/Psych: Mood is stable, no new  changes  MSK: (+) right shoulder weakness  All other systems were reviewed with the patient and are negative.  MEDICAL HISTORY:  Past Medical History:  Diagnosis Date  . Abdominal discomfort    Chronic N/V/D. Presumptive dx Crohn's dx per elevated p ANCA. Failed Entocort and Pentasa. Sep 2003 - ileocolectomy c anastomosis per Dr Deon Pilling 2/2 adhesions - path was hegative  for Crohns. EGD, Sm bowel follow through (11/03), and an eteroclysis (10/03) were unrevealing. Cuases hypomag and hypocalcemia.  . Adnexal mass 8/03   s/p lap BSO (R ovarian fibroma) & lysis of adhesions  . Allergy    Seasonal  . Anemia    Multifactorial. Baseline HgB 10-11 ish. B12 def - 150 in 3/10. Fe Def - ferritin 35 3/10. Both are being repleted.  . Breast cancer ([place]) 03/16/13   right, 5 o'clock  . CAD (coronary artery disease) 1996   1996 - PTCA and angioplasty diagonal branch. 2000 - Rotoblator & angiopllasty of diagonal. 2006 - subendocardial AMI, DES to proximal LAD.Marland Kitchen Also had 90% stenosis in distal apical LAD. EF 55 with apical hypokinesis. Indefinite ASA and Plavix.  . CHF (congestive heart failure) ([place])   . Chronic kidney disease    Chronic renal insuff baseline Cr 1.2 - 1.4 ish.  . Chronic pain    CT 10/10 = Spinal stenosis L2 - S1.  . Diabetes mellitus    Insulin dependent  . Hx of radiation therapy 06/02/13- 07/16/13   right rbeast 4500 cGy 25 sessions, right breast boost 1600 cGy in 8 sessions  . Hyperlipidemia    Managed with both a statin and Welchol. Welchol stopped 2014 2/2 cost and started on fenofibrate   . Hypertension    2006 B renal arteries patent. 2003 MRA - no RAS. 2003 pheo W/U Dr Hassell Done reportedly negative.  . Lupus ([place])   . Personal history of radiation therapy   . RBBB   . Stroke [facility])    Incidental finding MRI 2002 L lacunar infarct  . Wears dentures    top    SURGICAL HISTORY: Past Surgical History:  Procedure Laterality Date  . ABDOMINAL HYSTERECTOMY    . BILATERAL SALPINGOOPHORECTOMY  8/03   Lap BSO (R ovarian fibroma) and adhesion lysis  . BOWEL RESECTION  2003   ileocolectomy with anastomosis 2/2 adhesions  . BREAST BIOPSY    . BREAST LUMPECTOMY Right   . BREAST LUMPECTOMY WITH NEEDLE LOCALIZATION AND AXILLARY SENTINEL LYMPH NODE BX Right 04/22/2013   Procedure: BREAST LUMPECTOMY WITH NEEDLE LOCALIZATION AND AXILLARY SENTINEL LYMPH NODE  BX;  Surgeon: Stark Klein, MD;  Location: [place];  Service: General;  Laterality: Right;  . CARDIAC CATHETERIZATION     2 stents  . CHOLECYSTECTOMY    . HEMICOLECTOMY     R sided hemicolectomy  . HERNIA REPAIR     Ventral hernia repair  . 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  ALLERGIES:  is allergic to percocet [oxycodone-acetaminophen]; diazepam; haloperidol lactate; lorazepam; morphine sulfate; propoxyphene hcl; and tramadol hcl.  MEDICATIONS:  Current Outpatient Medications  Medication Sig Dispense Refill  . acetaminophen (TYLENOL) 325 MG tablet Take 650 mg by mouth every 6 (six) hours as needed for mild pain.    Marland Kitchen anastrozole (ARIMIDEX) 1 MG tablet Take 1 tablet (1 mg total) by mouth daily. 90 tablet  3  . aspirin EC 81 MG tablet Take 81 mg by mouth daily.    Marland Kitchen atorvastatin (LIPITOR) 40 MG tablet Take 1 tablet (40 mg total) by mouth at bedtime. 90 tablet 3  . carvedilol (COREG) 6.25 MG tablet Take 6.25 mg by mouth 2 (two) times daily.    . clopidogrel (PLAVIX) 75 MG tablet TAKE 1 TABLET BY MOUTH ONCE A DAY 90 tablet 3  . cyanocobalamin (,VITAMIN B-12,) 1000 MCG/ML injection Inject 1 mL (1,000 mcg total) into the muscle every 30 (thirty) days. 1 mL 11  . cyclobenzaprine (FLEXERIL) 5 MG tablet TAKE 1 TABLET BY MOUTH AT  BEDTIME AS NEEDED FOR  MUSCLE SPASM(S) 90 tablet 0  . desloratadine (CLARINEX) 5 MG tablet Take 5 mg by mouth at bedtime as needed (allergies). Reported on 04/11/2015    . fenofibrate 54 MG tablet Take 54 mg by mouth daily.     . ferrous fumarate (HEMOCYTE - 106 MG FE) 325 (106 Fe) MG TABS tablet Take 1 tablet (106 mg of iron total) by mouth daily. (Patient not taking: Reported on 12/20/2016) 30 each 0  . furosemide (LASIX) 40 MG tablet Take 2 tablets (80 mg total) by mouth daily. 180 tablet 3  . gabapentin (NEURONTIN) 300  MG capsule Take 2 at bedtime, one in morning as needed 270 capsule 3  . hydrOXYzine (VISTARIL) 50 MG capsule TAKE 1 CAPSULE BY MOUTH ONCE DAILY AT BEDTIME AS NEEDED 90 capsule 3  . Insulin Detemir (LEVEMIR FLEXTOUCH) 100 UNIT/ML Pen Inject 36 UNITS SUBCUTANEOUSLY at the same time each day 15 mL 11  . Insulin Pen Needle (B-D UF III MINI PEN NEEDLES) 31G X 5 MM MISC Use to inject insulin one time a day 100 each 3  . loperamide (IMODIUM) 2 MG capsule TAKE ONE CAPSULE BY MOUTH ONCE DAILY AS NEEDED FOR DIARRHEA OR LOOSE STOOLS 30 capsule 2  . losartan (COZAAR) 50 MG tablet Take 1 tablet (50 mg total) by mouth daily. 90 tablet 3  . nitroGLYCERIN (NITROSTAT) 0.4 MG SL tablet Place 1 tablet (0.4 mg total) under the tongue every 5 (five) minutes as needed for chest pain. 25 tablet 3  . ONE TOUCH ULTRA TEST test strip USE TO CHECK BLOOD SUGAR 3  TO 4 TIMES DAILY 400 each 3  . potassium chloride (K-DUR) 10 MEQ tablet Take 2 tablets (20 mEq total) by mouth daily. 180 tablet 3  . repaglinide (PRANDIN) 2 MG tablet TAKE 1 TABLET BY MOUTH 3  TIMES A DAY BEFORE MEALS 270 tablet 3  . TRADJENTA 5 MG TABS tablet TAKE 1 TABLET BY MOUTH  DAILY 90 tablet 3  . ULORIC 40 MG tablet Take 1 tablet by mouth daily.     No current facility-administered medications for this visit.    Facility-Administered Medications Ordered in Other Visits  Medication Dose Route Frequency Provider Last Rate Last Dose  . 0.9 %  sodium chloride infusion   Intravenous Continuous Burgess Estelle, MD 150 mL/hr at 10/03/15 1607      PHYSICAL EXAMINATION:  ECOG PERFORMANCE STATUS:1  Vitals:   06/19/17 0826  BP: (!) 142/55  Pulse: 68  Resp: 18  Temp: 98.1 F (36.7 C)  SpO2: 99%   Filed Weights   06/19/17 0826  Weight: 192 lb 1.6 oz (87.1 kg)    GENERAL:alert, no distress and comfortable SKIN: skin color, texture, turgor are normal, no rashes or significant lesions EYES: normal, Conjunctiva are pink and non-injected, sclera  clear OROPHARYNX:no exudate,  no erythema and lips, buccal mucosa, and tongue normal  NECK: supple, thyroid normal size, non-tender, without nodularity LYMPH:  no palpable lymphadenopathy in the cervical, axillary or inguinal LUNGS: clear to auscultation and percussion with normal breathing effort HEART: regular rate & rhythm and no murmurs and no lower extremity edema ABDOMEN:abdomen soft, non-tender and normal bowel sounds Musculoskeletal:no cyanosis of digits and no clubbing  NEURO: alert & oriented x 3 with fluent speech, no focal motor/sensory deficits BREAST: (+) diffuse skin pigmentation of R breast with mild lymphedema,  and firmness in the lower outer quadrant of right breast, non-tender. No discrete mass. Exam of the left breast and bilateral axilla were negative.  LABORATORY DATA:  I have reviewed the data as listed CBC Latest Ref Rng & Units 06/19/2017 03/14/2017 12/19/2016  WBC 3.9 - 10.3 K/uL 7.8 6.1 9.9  Hemoglobin 11.6 - 15.9 g/dL 8.7(L) 9.3(L) 8.9(L)  Hematocrit 34.8 - 46.6 % 27.6(L) 29.2(L) 27.6(L)  Platelets 145 - 400 K/uL 296 292 312     CMP Latest Ref Rng & Units 06/19/2017 03/14/2017 01/02/2017  Glucose 70 - 140 mg/dL 121 177(H) -  BUN 7 - 26 mg/dL 40(H) 35.6(H) -  Creatinine 0.60 - 1.10 mg/dL 1.77(H) 1.8(H) -  Sodium 136 - 145 mmol/L 143 140 -  Potassium 3.5 - 5.1 mmol/L 3.8 3.8 -  Chloride 98 - 109 mmol/L 111(H) - -  CO2 22 - 29 mmol/L 25 27 -  Calcium 8.4 - 10.4 mg/dL 9.7 9.6 9.9  Total Protein 6.4 - 8.3 g/dL 7.7 7.8 -  Total Bilirubin 0.2 - 1.2 mg/dL 0.2 0.28 -  Alkaline Phos 40 - 150 U/L 48 61 -  AST 5 - 34 U/L 13 17 -  ALT 0 - 55 U/L 15 17 -   PROCURES  ECHO 10/26/16 Study Conclusions - Left ventricle: The cavity size was normal. Wall thickness was   increased in a pattern of moderate LVH. Systolic function was   normal. The estimated ejection fraction was in the range of 60%   to 65%. Wall motion was normal; there were no regional wall   motion  abnormalities. Normal GLPSS at -23%. Doppler parameters   are consistent with abnormal left ventricular relaxation (grade 1   diastolic dysfunction). The Ee&' ratio is between 8-15, suggesting   indeterminate LV filling pressure. - Mitral valve: Mildly thickened leaflets . There was trivial   regurgitation. - Left atrium: The atrium was normal in size. - Tricuspid valve: There was mild regurgitation. - Pulmonary arteries: PA peak pressure: 42 mm Hg (S). - Inferior vena cava: The vessel was normal in size. The   respirophasic diameter changes were in the normal range (>= 50%),   consistent with normal central venous pressure. Impressions: - Compared to a prior study in 2012, the LVEF is stable. The RVSP   is now higher at 42 mmHg (compared to 32 mmHg previously).   RADIOGRAPHIC STUDIES: I have personally reviewed the radiological images as listed and agreed with the findings in the report. No results found.   Diagnostic Mammogram 03/22/17 IMPRESSION: No mammographic evidence for malignancy.   ASSESSMENT & PLAN: 72 y.o. African-American female, post menopause  1.  Breast cancer of the lower inner quadrant of the right breast, invasive ductal carcinoma with DCIS, pT1bN0Mo, stage IA,   ER+ and PR +, HER 2 negative -I have reviewed her medical records extensively, and confirmed acute findings with patient.  -She has had very early stage breast cancer, and complete  surgical resection, her risk of recurrence is low based on the Oncotype.  -She is clinically doing well. Labs reviewed she has moderate Anemia now, otherwise normal. Her physical exam was unremarkable except the fullness in the outer lower quadrant of right breast. Her mammogram from 03/22/17 revealed no evidence of malignancy. There is no clinical concern for recurrence.  -Continue anastrozole, for total of 5-7 years. - f/u in 6 months    2. Anemia of chronic disease (CKD) - present back to 2012. Appears secondary from CKD  and other chronic diseases by evaluation as noted and seems asymptomatic.  - hemoglobin 9.6 previously, overall trending down  -I previously encouraged her to continue daily iron supplement - I have previously encouraged her to eat more iron rich foods and take multivitamins - Patient receives a B12 shot every month at PCP's office - I will check iron levels every 6 months -Her anemia slightly worse, Hg at 8.9 (12/19/16). If her levels remain low or worsen she may need IV iron. She will follow up with Nephrologist, Dr. Hollie Salk. I previously discussed the role of EPO in anemia from CKD I recommend her to start if her Hb persistently below 9.0 -I called pt and reviewed her lab results previously, including iron study. She had low transferrin saturation and high TIBC, consistent with mild iron deficiency. I recommended her to start OTC iron pill ferrous sulfate 1-2 tab daily, constipation reviewed. She agreed. I will schedule her to repeat lab in 2 months, and I will consider iv iron if her iron level does not improve -Her Hgb is 8.7 today (06/19/17) and HCT is 27.6. She states she takes iron pill daily. I discussed the benefits and risks of Aranesp injection today. She states she does feel tired. She is interested in injection, I gave her reading material.  Potential benefits and side effects were discussed with patient, especially the small risk of cancer recurrence.  She will see her nephrologist, Dr. Hollie Salk next month and will discuss Aranesp with her. I advised her to increased her oral iron pil to BID in the meantime    3. Low Back, right hip and RLE pain/stiffness from degenerative arthritis and spinal stenosis - f/u with Orthopedics    4. DM - manged by PCP - I encouraged her to exercise to help with her DM - I strongly advised the patient to watch her BP and glucose levels.  5. Chronic renal insufficieny, CKD stage III  - Lab review, creatinine 1.8 previously (07/19/16) -Cr. Increased to 2.3 and  BUN at 65.6 (12/19/16), She sees Dr. Hollie Salk every 3 months and will f/u for her kidney function.  -She knows to avoid nephrotoxins  6. CAD, HTN - followed by Dr. Johnsie Cancel   7. Genetics - Patient has multiple family members who have/had cancer - She has had genetic testing which came back normal  8. Hot Flashes -They have worsened lately -She previously took Neurontin once (345m) at night for gout. I previously suggested increasing to two capsule at night and as needed in the morning. If that does not help I will call in SSRI for her hot flash.  -She takes 300 mg Neuronin during the day and 600 mg in the evening. Hot flash is improved   9. BMadison Heightswas in 02/2016 with T score of 1.0, normal  -Next DEXA due 02/2018, will order next visit  -We discussed the potential osteopenia and osteoporosis from anastrozole, she will continue vitamin D supplement.  PLAN  -she will discuss with her nephrologist Dr. Hollie Salk about Aranesp injection for her chronic anemia  -She will increase her iron pill to BID in the meantime, if she has iron deficiency on lab, I will also consider IV iron -order DEXA and mammogram at next visit  -Lab and f/u in 6 months, or sooner if she decides to start Aranesp injection  No problem-specific Assessment & Plan notes found for this encounter.   No orders of the defined types were placed in this encounter.  All questions were answered. The patient knows to call the clinic with any problems, questions or concerns. No barriers to learning was detected.  I spent 20 minutes counseling the patient face to face. The total time spent in the appointment was 25 minutes and more than 50% was on counseling and review of test results  This document serves as a record of services personally performed by Truitt Merle, MD. It was created on her behalf by Theresia Bough, a trained medical scribe. The creation of this record is based on the scribe's personal observations and  the provider's statements to them.   I have reviewed the above documentation for accuracy and completeness, and I agree with the above.    Truitt Merle, MD 06/19/2017 9:15 AM

## 2017-06-17 NOTE — Telephone Encounter (Signed)
This is a 2nd request from [facility], amlodipine & fenofibrate no longer on medlist. Spoke to patient & informed of these requests.  I have verified that she's no longer taking amlodipine.  She states she is still taking fenofibrate bec nobody told her to stop it. I informed her that the atorvastatin is what is prescribed for her cholesterol. She will stop taking fenofibrate. She still takes flexeril & is working good for her, she would like a refill.  I have spelled these meds to her just to verify but does she need medrec appt w/ Dr. Maudie Mercury? pls advise!

## 2017-06-18 NOTE — Telephone Encounter (Signed)
Norvasc had been stopped for edema I had her on fenofibrate but had been D/C'd by Pfohl, [place]??? Needs more researching Refilled flexeril

## 2017-06-19 ENCOUNTER — Telehealth: Payer: Self-pay | Admitting: Hematology

## 2017-06-19 ENCOUNTER — Encounter: Payer: Self-pay | Admitting: Hematology

## 2017-06-19 ENCOUNTER — Inpatient Hospital Stay: Payer: Medicare Other

## 2017-06-19 ENCOUNTER — Inpatient Hospital Stay: Payer: Medicare Other | Attending: Hematology | Admitting: Hematology

## 2017-06-19 VITALS — BP 142/55 | HR 68 | Temp 98.1°F | Resp 18 | Ht 65.0 in | Wt 192.1 lb

## 2017-06-19 DIAGNOSIS — I129 Hypertensive chronic kidney disease with stage 1 through stage 4 chronic kidney disease, or unspecified chronic kidney disease: Secondary | ICD-10-CM | POA: Diagnosis not present

## 2017-06-19 DIAGNOSIS — N183 Chronic kidney disease, stage 3 (moderate): Secondary | ICD-10-CM | POA: Diagnosis not present

## 2017-06-19 DIAGNOSIS — Z853 Personal history of malignant neoplasm of breast: Secondary | ICD-10-CM

## 2017-06-19 DIAGNOSIS — E1122 Type 2 diabetes mellitus with diabetic chronic kidney disease: Secondary | ICD-10-CM | POA: Diagnosis not present

## 2017-06-19 DIAGNOSIS — D508 Other iron deficiency anemias: Secondary | ICD-10-CM

## 2017-06-19 DIAGNOSIS — Z794 Long term (current) use of insulin: Secondary | ICD-10-CM | POA: Insufficient documentation

## 2017-06-19 DIAGNOSIS — D631 Anemia in chronic kidney disease: Secondary | ICD-10-CM | POA: Insufficient documentation

## 2017-06-19 DIAGNOSIS — Z170 Estrogen receptor positive status [ER+]: Secondary | ICD-10-CM | POA: Diagnosis not present

## 2017-06-19 DIAGNOSIS — N951 Menopausal and female climacteric states: Secondary | ICD-10-CM | POA: Diagnosis not present

## 2017-06-19 DIAGNOSIS — C50311 Malignant neoplasm of lower-inner quadrant of right female breast: Secondary | ICD-10-CM | POA: Insufficient documentation

## 2017-06-19 DIAGNOSIS — Z79811 Long term (current) use of aromatase inhibitors: Secondary | ICD-10-CM | POA: Insufficient documentation

## 2017-06-19 DIAGNOSIS — I2510 Atherosclerotic heart disease of native coronary artery without angina pectoris: Secondary | ICD-10-CM

## 2017-06-19 DIAGNOSIS — N184 Chronic kidney disease, stage 4 (severe): Secondary | ICD-10-CM

## 2017-06-19 LAB — CBC WITH DIFFERENTIAL/PLATELET
BASOS PCT: 0 %
Basophils Absolute: 0 10*3/uL (ref 0.0–0.1)
EOS PCT: 1 %
Eosinophils Absolute: 0.1 10*3/uL (ref 0.0–0.5)
HCT: 27.6 % — ABNORMAL LOW (ref 34.8–46.6)
Hemoglobin: 8.7 g/dL — ABNORMAL LOW (ref 11.6–15.9)
LYMPHS PCT: 23 %
Lymphs Abs: 1.8 10*3/uL (ref 0.9–3.3)
MCH: 25.3 pg (ref 25.1–34.0)
MCHC: 31.5 g/dL (ref 31.5–36.0)
MCV: 80.2 fL (ref 79.5–101.0)
MONO ABS: 0.4 10*3/uL (ref 0.1–0.9)
MONOS PCT: 6 %
NEUTROS ABS: 5.4 10*3/uL (ref 1.5–6.5)
Neutrophils Relative %: 70 %
Platelets: 296 10*3/uL (ref 145–400)
RBC: 3.44 MIL/uL — ABNORMAL LOW (ref 3.70–5.45)
RDW: 14.4 % (ref 11.2–14.5)
WBC: 7.8 10*3/uL (ref 3.9–10.3)

## 2017-06-19 LAB — COMPREHENSIVE METABOLIC PANEL
ALBUMIN: 3.4 g/dL — AB (ref 3.5–5.0)
ALT: 15 U/L (ref 0–55)
AST: 13 U/L (ref 5–34)
Alkaline Phosphatase: 48 U/L (ref 40–150)
Anion gap: 7 (ref 3–11)
BUN: 40 mg/dL — AB (ref 7–26)
CHLORIDE: 111 mmol/L — AB (ref 98–109)
CO2: 25 mmol/L (ref 22–29)
CREATININE: 1.77 mg/dL — AB (ref 0.60–1.10)
Calcium: 9.7 mg/dL (ref 8.4–10.4)
GFR calc Af Amer: 33 mL/min — ABNORMAL LOW (ref >60)
GFR, EST NON AFRICAN AMERICAN: 28 mL/min — AB (ref >60)
GLUCOSE: 121 mg/dL (ref 70–140)
POTASSIUM: 3.8 mmol/L (ref 3.5–5.1)
SODIUM: 143 mmol/L (ref 136–145)
Total Bilirubin: 0.2 mg/dL (ref 0.2–1.2)
Total Protein: 7.7 g/dL (ref 6.4–8.3)

## 2017-06-19 LAB — IRON AND TIBC
Iron: 43 ug/dL (ref 41–142)
SATURATION RATIOS: 10 % — AB (ref 21–57)
TIBC: 426 ug/dL (ref 236–444)
UIBC: 383 ug/dL

## 2017-06-19 LAB — FERRITIN: FERRITIN: 50 ng/mL (ref 9–269)

## 2017-06-19 NOTE — Telephone Encounter (Signed)
Appointments scheduled AVS/Calendar printed per 4/3 los

## 2017-06-20 LAB — VITAMIN D 25 HYDROXY (VIT D DEFICIENCY, FRACTURES): VIT D 25 HYDROXY: 25.6 ng/mL — AB (ref 30.0–100.0)

## 2017-06-26 ENCOUNTER — Telehealth: Payer: Self-pay | Admitting: *Deleted

## 2017-06-26 NOTE — Telephone Encounter (Signed)
Spoke with pt and informed pt of lab results and instructions from Dr. Burr Medico.  Instructed pt to increase OTC Vit D and Iron to  Twice daily.   Offered pt appt for IV Feraheme; however, pt stated she would like to discuss with Dr. Hollie Salk, nephrologist first.  Asked pt to call office back if pt decides to receive IV Iron.  Pt voiced understanding.

## 2017-06-26 NOTE — Telephone Encounter (Signed)
-----   Message from Truitt Merle, MD sent at 06/25/2017 12:55 PM EDT ----- Please call pt and let her know the lab results, encourage her to increase her OTC vitD intake (double the dose she is on now). Her iron level is slightly low, due to her anemia, I told her to increase oral iron on her last visit. If she is interested iv iron, OK to schedule once. Let me know if I need to order iv feraheme. I think iv iron may help her anemia of chronic disease from CKD also. Thanks  Truitt Merle  06/25/2017

## 2017-07-23 ENCOUNTER — Encounter: Payer: Self-pay | Admitting: Internal Medicine

## 2017-07-23 ENCOUNTER — Other Ambulatory Visit: Payer: Self-pay

## 2017-07-23 ENCOUNTER — Ambulatory Visit (HOSPITAL_COMMUNITY)
Admission: RE | Admit: 2017-07-23 | Discharge: 2017-07-23 | Disposition: A | Discharge status: Home / Self Care | Payer: Medicare Other | Source: Ambulatory Visit | Attending: Internal Medicine | Admitting: Internal Medicine

## 2017-07-23 ENCOUNTER — Ambulatory Visit (INDEPENDENT_AMBULATORY_CARE_PROVIDER_SITE_OTHER): Payer: Medicare Other | Admitting: Internal Medicine

## 2017-07-23 ENCOUNTER — Observation Stay (HOSPITAL_COMMUNITY): Payer: Medicare Other

## 2017-07-23 ENCOUNTER — Inpatient Hospital Stay (HOSPITAL_COMMUNITY)
Admission: AD | Admit: 2017-07-23 | Discharge: 2017-08-06 | DRG: 870 | Disposition: A | Discharge status: Rehabilitation Facility | Payer: Medicare Other | Source: Ambulatory Visit | Attending: Family Medicine | Admitting: Family Medicine

## 2017-07-23 ENCOUNTER — Encounter (INDEPENDENT_AMBULATORY_CARE_PROVIDER_SITE_OTHER): Payer: Self-pay

## 2017-07-23 VITALS — BP 104/53 | HR 70 | Temp 98.1°F

## 2017-07-23 DIAGNOSIS — E870 Hyperosmolality and hypernatremia: Secondary | ICD-10-CM | POA: Diagnosis not present

## 2017-07-23 DIAGNOSIS — Z801 Family history of malignant neoplasm of trachea, bronchus and lung: Secondary | ICD-10-CM

## 2017-07-23 DIAGNOSIS — K56609 Unspecified intestinal obstruction, unspecified as to partial versus complete obstruction: Secondary | ICD-10-CM

## 2017-07-23 DIAGNOSIS — R6521 Severe sepsis with septic shock: Secondary | ICD-10-CM | POA: Diagnosis not present

## 2017-07-23 DIAGNOSIS — R748 Abnormal levels of other serum enzymes: Secondary | ICD-10-CM | POA: Diagnosis not present

## 2017-07-23 DIAGNOSIS — Z79811 Long term (current) use of aromatase inhibitors: Secondary | ICD-10-CM

## 2017-07-23 DIAGNOSIS — Z923 Personal history of irradiation: Secondary | ICD-10-CM

## 2017-07-23 DIAGNOSIS — R0902 Hypoxemia: Secondary | ICD-10-CM

## 2017-07-23 DIAGNOSIS — L8990 Pressure ulcer of unspecified site, unspecified stage: Secondary | ICD-10-CM

## 2017-07-23 DIAGNOSIS — J80 Acute respiratory distress syndrome: Secondary | ICD-10-CM | POA: Diagnosis not present

## 2017-07-23 DIAGNOSIS — I5032 Chronic diastolic (congestive) heart failure: Secondary | ICD-10-CM | POA: Diagnosis not present

## 2017-07-23 DIAGNOSIS — D638 Anemia in other chronic diseases classified elsewhere: Secondary | ICD-10-CM | POA: Diagnosis present

## 2017-07-23 DIAGNOSIS — N179 Acute kidney failure, unspecified: Secondary | ICD-10-CM | POA: Diagnosis present

## 2017-07-23 DIAGNOSIS — R079 Chest pain, unspecified: Secondary | ICD-10-CM | POA: Diagnosis present

## 2017-07-23 DIAGNOSIS — A419 Sepsis, unspecified organism: Secondary | ICD-10-CM

## 2017-07-23 DIAGNOSIS — Z8673 Personal history of transient ischemic attack (TIA), and cerebral infarction without residual deficits: Secondary | ICD-10-CM

## 2017-07-23 DIAGNOSIS — J9601 Acute respiratory failure with hypoxia: Secondary | ICD-10-CM | POA: Diagnosis not present

## 2017-07-23 DIAGNOSIS — I959 Hypotension, unspecified: Secondary | ICD-10-CM | POA: Diagnosis present

## 2017-07-23 DIAGNOSIS — R1311 Dysphagia, oral phase: Secondary | ICD-10-CM | POA: Diagnosis present

## 2017-07-23 DIAGNOSIS — E876 Hypokalemia: Secondary | ICD-10-CM | POA: Diagnosis not present

## 2017-07-23 DIAGNOSIS — E160 Drug-induced hypoglycemia without coma: Secondary | ICD-10-CM | POA: Diagnosis not present

## 2017-07-23 DIAGNOSIS — I248 Other forms of acute ischemic heart disease: Secondary | ICD-10-CM | POA: Diagnosis not present

## 2017-07-23 DIAGNOSIS — Z8249 Family history of ischemic heart disease and other diseases of the circulatory system: Secondary | ICD-10-CM

## 2017-07-23 DIAGNOSIS — N183 Chronic kidney disease, stage 3 (moderate): Secondary | ICD-10-CM | POA: Diagnosis present

## 2017-07-23 DIAGNOSIS — Y95 Nosocomial condition: Secondary | ICD-10-CM | POA: Diagnosis present

## 2017-07-23 DIAGNOSIS — D62 Acute posthemorrhagic anemia: Secondary | ICD-10-CM | POA: Diagnosis present

## 2017-07-23 DIAGNOSIS — Z9289 Personal history of other medical treatment: Secondary | ICD-10-CM

## 2017-07-23 DIAGNOSIS — I469 Cardiac arrest, cause unspecified: Secondary | ICD-10-CM | POA: Diagnosis not present

## 2017-07-23 DIAGNOSIS — I872 Venous insufficiency (chronic) (peripheral): Secondary | ICD-10-CM | POA: Diagnosis not present

## 2017-07-23 DIAGNOSIS — E11649 Type 2 diabetes mellitus with hypoglycemia without coma: Secondary | ICD-10-CM | POA: Diagnosis not present

## 2017-07-23 DIAGNOSIS — Z7982 Long term (current) use of aspirin: Secondary | ICD-10-CM

## 2017-07-23 DIAGNOSIS — J690 Pneumonitis due to inhalation of food and vomit: Secondary | ICD-10-CM | POA: Diagnosis not present

## 2017-07-23 DIAGNOSIS — I82409 Acute embolism and thrombosis of unspecified deep veins of unspecified lower extremity: Secondary | ICD-10-CM

## 2017-07-23 DIAGNOSIS — E785 Hyperlipidemia, unspecified: Secondary | ICD-10-CM | POA: Diagnosis present

## 2017-07-23 DIAGNOSIS — R570 Cardiogenic shock: Secondary | ICD-10-CM | POA: Diagnosis present

## 2017-07-23 DIAGNOSIS — Z452 Encounter for adjustment and management of vascular access device: Secondary | ICD-10-CM

## 2017-07-23 DIAGNOSIS — C799 Secondary malignant neoplasm of unspecified site: Secondary | ICD-10-CM | POA: Diagnosis not present

## 2017-07-23 DIAGNOSIS — R042 Hemoptysis: Secondary | ICD-10-CM

## 2017-07-23 DIAGNOSIS — R093 Abnormal sputum: Secondary | ICD-10-CM

## 2017-07-23 DIAGNOSIS — I2510 Atherosclerotic heart disease of native coronary artery without angina pectoris: Secondary | ICD-10-CM

## 2017-07-23 DIAGNOSIS — C50911 Malignant neoplasm of unspecified site of right female breast: Secondary | ICD-10-CM | POA: Diagnosis not present

## 2017-07-23 DIAGNOSIS — T17990A Other foreign object in respiratory tract, part unspecified in causing asphyxiation, initial encounter: Secondary | ICD-10-CM | POA: Diagnosis not present

## 2017-07-23 DIAGNOSIS — Z803 Family history of malignant neoplasm of breast: Secondary | ICD-10-CM | POA: Diagnosis not present

## 2017-07-23 DIAGNOSIS — Z8042 Family history of malignant neoplasm of prostate: Secondary | ICD-10-CM

## 2017-07-23 DIAGNOSIS — R42 Dizziness and giddiness: Secondary | ICD-10-CM

## 2017-07-23 DIAGNOSIS — Z800 Family history of malignant neoplasm of digestive organs: Secondary | ICD-10-CM

## 2017-07-23 DIAGNOSIS — I130 Hypertensive heart and chronic kidney disease with heart failure and stage 1 through stage 4 chronic kidney disease, or unspecified chronic kidney disease: Secondary | ICD-10-CM | POA: Diagnosis not present

## 2017-07-23 DIAGNOSIS — R579 Shock, unspecified: Secondary | ICD-10-CM

## 2017-07-23 DIAGNOSIS — K567 Ileus, unspecified: Secondary | ICD-10-CM

## 2017-07-23 DIAGNOSIS — I4510 Unspecified right bundle-branch block: Secondary | ICD-10-CM | POA: Diagnosis present

## 2017-07-23 DIAGNOSIS — E1165 Type 2 diabetes mellitus with hyperglycemia: Secondary | ICD-10-CM | POA: Diagnosis not present

## 2017-07-23 DIAGNOSIS — E872 Acidosis: Secondary | ICD-10-CM | POA: Diagnosis present

## 2017-07-23 DIAGNOSIS — R001 Bradycardia, unspecified: Secondary | ICD-10-CM | POA: Diagnosis not present

## 2017-07-23 DIAGNOSIS — I340 Nonrheumatic mitral (valve) insufficiency: Secondary | ICD-10-CM | POA: Diagnosis not present

## 2017-07-23 DIAGNOSIS — Z955 Presence of coronary angioplasty implant and graft: Secondary | ICD-10-CM

## 2017-07-23 DIAGNOSIS — Z7902 Long term (current) use of antithrombotics/antiplatelets: Secondary | ICD-10-CM

## 2017-07-23 DIAGNOSIS — E119 Type 2 diabetes mellitus without complications: Secondary | ICD-10-CM | POA: Diagnosis not present

## 2017-07-23 DIAGNOSIS — Z952 Presence of prosthetic heart valve: Secondary | ICD-10-CM

## 2017-07-23 DIAGNOSIS — D72819 Decreased white blood cell count, unspecified: Secondary | ICD-10-CM | POA: Diagnosis present

## 2017-07-23 DIAGNOSIS — Z885 Allergy status to narcotic agent status: Secondary | ICD-10-CM | POA: Diagnosis not present

## 2017-07-23 DIAGNOSIS — R0489 Hemorrhage from other sites in respiratory passages: Secondary | ICD-10-CM | POA: Diagnosis not present

## 2017-07-23 DIAGNOSIS — Z853 Personal history of malignant neoplasm of breast: Secondary | ICD-10-CM

## 2017-07-23 DIAGNOSIS — R0602 Shortness of breath: Secondary | ICD-10-CM

## 2017-07-23 DIAGNOSIS — R05 Cough: Secondary | ICD-10-CM | POA: Diagnosis not present

## 2017-07-23 DIAGNOSIS — Z978 Presence of other specified devices: Secondary | ICD-10-CM

## 2017-07-23 DIAGNOSIS — J9690 Respiratory failure, unspecified, unspecified whether with hypoxia or hypercapnia: Secondary | ICD-10-CM

## 2017-07-23 DIAGNOSIS — G9341 Metabolic encephalopathy: Secondary | ICD-10-CM | POA: Diagnosis not present

## 2017-07-23 DIAGNOSIS — R140 Abdominal distension (gaseous): Secondary | ICD-10-CM

## 2017-07-23 DIAGNOSIS — I361 Nonrheumatic tricuspid (valve) insufficiency: Secondary | ICD-10-CM | POA: Diagnosis not present

## 2017-07-23 DIAGNOSIS — Z794 Long term (current) use of insulin: Secondary | ICD-10-CM

## 2017-07-23 DIAGNOSIS — E1122 Type 2 diabetes mellitus with diabetic chronic kidney disease: Secondary | ICD-10-CM | POA: Diagnosis present

## 2017-07-23 DIAGNOSIS — J189 Pneumonia, unspecified organism: Secondary | ICD-10-CM

## 2017-07-23 DIAGNOSIS — J181 Lobar pneumonia, unspecified organism: Secondary | ICD-10-CM | POA: Diagnosis not present

## 2017-07-23 DIAGNOSIS — I5030 Unspecified diastolic (congestive) heart failure: Secondary | ICD-10-CM

## 2017-07-23 DIAGNOSIS — Z888 Allergy status to other drugs, medicaments and biological substances status: Secondary | ICD-10-CM | POA: Diagnosis not present

## 2017-07-23 DIAGNOSIS — E1142 Type 2 diabetes mellitus with diabetic polyneuropathy: Secondary | ICD-10-CM | POA: Diagnosis present

## 2017-07-23 DIAGNOSIS — T383X5A Adverse effect of insulin and oral hypoglycemic [antidiabetic] drugs, initial encounter: Secondary | ICD-10-CM | POA: Diagnosis not present

## 2017-07-23 DIAGNOSIS — R443 Hallucinations, unspecified: Secondary | ICD-10-CM | POA: Diagnosis not present

## 2017-07-23 DIAGNOSIS — R509 Fever, unspecified: Secondary | ICD-10-CM | POA: Diagnosis not present

## 2017-07-23 DIAGNOSIS — G4700 Insomnia, unspecified: Secondary | ICD-10-CM | POA: Diagnosis present

## 2017-07-23 DIAGNOSIS — Z9911 Dependence on respirator [ventilator] status: Secondary | ICD-10-CM | POA: Diagnosis not present

## 2017-07-23 DIAGNOSIS — E1169 Type 2 diabetes mellitus with other specified complication: Secondary | ICD-10-CM | POA: Diagnosis not present

## 2017-07-23 HISTORY — DX: Hypoxemia: R09.02

## 2017-07-23 LAB — BASIC METABOLIC PANEL
ANION GAP: 8 (ref 5–15)
BUN: 59 mg/dL — ABNORMAL HIGH (ref 6–20)
CALCIUM: 9 mg/dL (ref 8.9–10.3)
CO2: 27 mmol/L (ref 22–32)
Chloride: 106 mmol/L (ref 101–111)
Creatinine, Ser: 2.31 mg/dL — ABNORMAL HIGH (ref 0.44–1.00)
GFR, EST AFRICAN AMERICAN: 23 mL/min — AB (ref >60)
GFR, EST NON AFRICAN AMERICAN: 20 mL/min — AB (ref >60)
Glucose, Bld: 84 mg/dL (ref 65–99)
POTASSIUM: 3.4 mmol/L — AB (ref 3.5–5.1)
Sodium: 141 mmol/L (ref 135–145)

## 2017-07-23 LAB — CBC
HCT: 27.4 % — ABNORMAL LOW (ref 36.0–46.0)
Hemoglobin: 8.7 g/dL — ABNORMAL LOW (ref 12.0–15.0)
MCH: 24.9 pg — AB (ref 26.0–34.0)
MCHC: 31.8 g/dL (ref 30.0–36.0)
MCV: 78.5 fL (ref 78.0–100.0)
Platelets: 319 10*3/uL (ref 150–400)
RBC: 3.49 MIL/uL — AB (ref 3.87–5.11)
RDW: 15.5 % (ref 11.5–15.5)
WBC: 3.2 10*3/uL — ABNORMAL LOW (ref 4.0–10.5)

## 2017-07-23 LAB — TROPONIN I: Troponin I: <0.03 ng/mL (ref <0.03)

## 2017-07-23 LAB — GLUCOSE, CAPILLARY
Glucose-Capillary: 125 mg/dL — ABNORMAL HIGH (ref 65–99)
Glucose-Capillary: 108 mg/dL — ABNORMAL HIGH (ref 65–99)

## 2017-07-23 LAB — PROCALCITONIN: Procalcitonin: 68.11 ng/mL

## 2017-07-23 LAB — BRAIN NATRIURETIC PEPTIDE: B Natriuretic Peptide: 176.4 pg/mL — ABNORMAL HIGH (ref 0.0–100.0)

## 2017-07-23 MED ORDER — FLUTICASONE PROPIONATE 50 MCG/ACT NA SUSP
1.0000 | Freq: Every day | NASAL | Status: DC
  Administered 2017-07-24: 1 via NASAL
  Filled 2017-07-23: qty 16

## 2017-07-23 MED ORDER — CLOPIDOGREL BISULFATE 75 MG PO TABS: 75.0000 mg | ORAL_TABLET | Freq: Every day | ORAL | Status: DC

## 2017-07-23 MED ORDER — INSULIN ASPART 100 UNIT/ML ~~LOC~~ SOLN: 0.0000 [IU] | Freq: Three times a day (TID) | SUBCUTANEOUS | Status: DC

## 2017-07-23 MED ORDER — SODIUM CHLORIDE 0.9 % IV SOLN
2.0000 g | INTRAVENOUS | Status: DC
  Administered 2017-07-24 – 2017-07-26 (×3): 2 g via INTRAVENOUS
  Filled 2017-07-23 (×6): qty 20

## 2017-07-23 MED ORDER — SODIUM CHLORIDE 0.9 % IV SOLN
INTRAVENOUS | Status: DC
  Administered 2017-07-23: 21:00:00 via INTRAVENOUS

## 2017-07-23 MED ORDER — HYDROXYZINE HCL 25 MG PO TABS
50.0000 mg | ORAL_TABLET | Freq: Every evening | ORAL | Status: DC | PRN
  Filled 2017-07-23: qty 1

## 2017-07-23 MED ORDER — FENOFIBRATE 54 MG PO TABS
54.0000 mg | ORAL_TABLET | Freq: Every day | ORAL | Status: DC
  Filled 2017-07-23: qty 1

## 2017-07-23 MED ORDER — SODIUM CHLORIDE 0.9 % IV SOLN
500.0000 mg | INTRAVENOUS | Status: DC
  Administered 2017-07-24 – 2017-07-26 (×4): 500 mg via INTRAVENOUS
  Filled 2017-07-23 (×6): qty 500

## 2017-07-23 MED ORDER — REPAGLINIDE 2 MG PO TABS: 2.0000 mg | ORAL_TABLET | Freq: Three times a day (TID) | ORAL | Status: DC

## 2017-07-23 MED ORDER — ATORVASTATIN CALCIUM 40 MG PO TABS
40.0000 mg | ORAL_TABLET | Freq: Every day | ORAL | Status: DC
  Administered 2017-07-23 – 2017-07-28 (×6): 40 mg via ORAL
  Filled 2017-07-23 (×6): qty 1

## 2017-07-23 MED ORDER — LINAGLIPTIN 5 MG PO TABS: 5.0000 mg | ORAL_TABLET | Freq: Every day | ORAL | Status: DC

## 2017-07-23 MED ORDER — INSULIN ASPART 100 UNIT/ML ~~LOC~~ SOLN: 0.0000 [IU] | Freq: Every day | SUBCUTANEOUS | Status: DC

## 2017-07-23 MED ORDER — ACETAMINOPHEN 325 MG PO TABS
650.0000 mg | ORAL_TABLET | Freq: Four times a day (QID) | ORAL | Status: DC | PRN
  Administered 2017-07-28 – 2017-08-04 (×3): 650 mg via ORAL
  Filled 2017-07-23 (×3): qty 2

## 2017-07-23 MED ORDER — CARVEDILOL 6.25 MG PO TABS: 6.2500 mg | ORAL_TABLET | Freq: Two times a day (BID) | ORAL | Status: DC

## 2017-07-23 MED ORDER — ENOXAPARIN SODIUM 30 MG/0.3ML ~~LOC~~ SOLN
30.0000 mg | SUBCUTANEOUS | Status: DC
  Administered 2017-07-23: 30 mg via SUBCUTANEOUS
  Filled 2017-07-23: qty 0.3

## 2017-07-23 MED ORDER — ANASTROZOLE 1 MG PO TABS
1.0000 mg | ORAL_TABLET | Freq: Every day | ORAL | Status: DC
  Filled 2017-07-23: qty 1

## 2017-07-23 MED ORDER — CYCLOBENZAPRINE HCL 10 MG PO TABS: 5.0000 mg | ORAL_TABLET | Freq: Every evening | ORAL | Status: DC | PRN

## 2017-07-23 MED ORDER — ACETAMINOPHEN 650 MG RE SUPP: 650.0000 mg | Freq: Four times a day (QID) | RECTAL | Status: DC | PRN

## 2017-07-23 MED ORDER — INSULIN DETEMIR 100 UNIT/ML ~~LOC~~ SOLN
20.0000 [IU] | Freq: Every day | SUBCUTANEOUS | Status: DC
  Administered 2017-07-24 – 2017-07-26 (×3): 20 [IU] via SUBCUTANEOUS
  Filled 2017-07-23 (×5): qty 0.2

## 2017-07-23 MED ORDER — ASPIRIN EC 81 MG PO TBEC
81.0000 mg | DELAYED_RELEASE_TABLET | Freq: Every day | ORAL | Status: DC
  Administered 2017-07-23: 81 mg via ORAL
  Filled 2017-07-23: qty 1

## 2017-07-23 NOTE — Assessment & Plan Note (Addendum)
[name] is high risk for myocardial infarction with her CAD history and diabetes. Initial EKG shows no STEMI and she reports compliance on antiplatelet therapy. Acute pulmonary embolus is possible although she has no recent provoking factors or lower extremity changes suggesting VTE. She has 3 weeks of intermittently productive cough that could represent atypical pneumonia or increasing pulmonary edema. She has chronic peripheral edema and we were unable to obtain an accurate clinic based weight due to her weakness and dyspnea.  Ultrasound examination showed grossly normal IVC respiratory variation and she does not have overt JVD. B lines were present throughout lung fields without any significant effusions.  Plan: -Admit to hospital for further evaluation -She needs serial troponins to exclude atypical MI presentation in elderly diabetic woman with new chest pain and hypotension -Would recommend telemetry for occult arrhythmia -Chest xray PA and lateral -Would give diuretics and consider repeat TTE if BNP elevated, pulmonary edema pattern on CXR -If no infiltrative process on image would check D-dimer for low probability PE

## 2017-07-23 NOTE — Progress Notes (Signed)
Patient seen at bedside at request of day team, nursing, and family. Briefly she has a history of HFpEF (EF 60-65%, G1DD), CAD s/p DES, CKD, DM, HTN, and right sided breast cancer who is admitted with hypoxia secondary to left sided pneumonia seen on portable CXR. She is accompanied by multiple family members including daughters and young granddaughter. Daughter reports a recent "strep-throat" like illness initially treated with amoxicillin, which was subsequently held due to diarrhea.  She is wearing a non-rebreather mask and appears to be resting comfortably without respiratory distress. She is alert and oriented and answering questions appropriately. Initially her O2 saturation is between 88-92% however increases to 94-95% when speaking and when asked to breathe through her mouth. She does report nasal congestion. Blood pressure is ~90/60. Rhonchi are heard bilaterally L > R on auscultation of the lungs. Heart rate is normal with regular rhythm.   She was started on gentle fluids shortly prior to our arrival. Antibiotics have not been started yet as we are awaiting delivery from pharmacy. I discussed and showed the CXR results to family as well as the plan to continue with antibiotic therapy and supplemental oxygen as needed (keeping in mind she is likely not receiving adequate oxygen via her nasal passages). Given her soft blood pressures and recent diarrhea, we will increase her fluid rate slightly and continue to hold her home BP meds and lasix.   Plan: Continue Azithromycin/Ceftriaxone Oxygen as needed Obtain 2VCXR NS @ 100/hr for 8 hours Strict I/Os, Daily weights Flonase   Zada Finders, MD Internal Medicine PGY-3

## 2017-07-23 NOTE — H&P (Addendum)
Date: 07/23/2017               Patient Name:  [name] MRN: [id number]  DOB: 1945-05-08 Age / Sex: 72 y.o., female   PCP: Bartholomew Crews, MD         Medical Service: Internal Medicine Teaching Service         Attending Physician: Dr. Oval Linsey, MD    First Contact: Dr. Ronalee Red Pager: [phone]  Second Contact: Dr. Philipp Ovens Pager: [phone]       After Hours (After 5p/  First Contact Pager: [phone]  weekends / holidays): Second Contact Pager: [phone]   Chief Complaint: chest pain  History of Present Illness:  [name] is a 72yo female with PMH of HTN, DM, CKD, breast cancer (on anastrozole), multivessel CAD s/p DES to LAD in 2006 (on DAPT), and HFpEF (TTE 10/2016: LVEF 60-65%, grade 1 DD) who presents from clinic with cough and chest pain. Daughter at bedside.  She endorses 3 weeks of nonproductive cough. She endorses chest congestion and feels that phlegm is "rattling" in her chest but that she cannot cough it up. She denies hemoptysis. She endorses runny nose and subjective fevers for the last several days. Denies sore throat. Denies sick contacts.  This morning at 2am, she woke up with sharp substernal chest pain that radiated to her left arm and jaw. She endorsed associated nausea, but denied diaphoresis or emesis. The pain lasted for about 30 minutes and resolved on its own, she did not take NTG for the pain. The pain is exacerbated by inspiration and cough and alleviated by lying on her left side. She experienced another episode of the same sharp chest pain when she was being wheeled into clinic this morning. She has never had this chest pain before. She states the pain is different from what she felt prior to her stent placement in 2006.   She denies shortness of breath aside from her chronic difficulty breathing, but she does endorse worsened orthopnea requiring her to double up on her two pillows, as well as PND and bilateral lower extremity and face  swelling.  Denies recent travel or surgeries. She is a never smoker and denies alcohol or illicit drug use. She denies a hx of known lung disease and is not on home oxygen. She reports compliance with her medications.  In clinic, she was noted to require oxygen, with sats 90-92% on 3L [place]. BP 115/63, HR 71. POCUS was performed, which showed B lines in bilateral lung fields without evidence of pleural or pericardial effusion, as well as easily collapsible IVC. Her weight was 188lbs in 03/2017 and 192lbs in 06/2017. Weight unable to be measured today due to weakness.  Meds:  No outpatient medications have been marked as taking for the 07/23/17 encounter [facility] Encounter).   Allergies: Allergies as of 07/23/2017 - Review Complete 07/23/2017  Allergen Reaction Noted  . Percocet [oxycodone-acetaminophen] Other (See Comments) 05/05/2013  . Diazepam Other (See Comments) 04/17/2006  . Haloperidol lactate Nausea And Vomiting   . Lorazepam Nausea Only   . Morphine sulfate Other (See Comments) 04/17/2006  . Propoxyphene hcl Nausea And Vomiting 05/16/2007  . Tramadol hcl Nausea And Vomiting    Past Medical History:  Diagnosis Date  . Abdominal discomfort    Chronic N/V/D. Presumptive dx Crohn's dx per elevated p ANCA. Failed Entocort and Pentasa. Sep 2003 - ileocolectomy c anastomosis per Dr Deon Pilling 2/2 adhesions - path was hegative for Crohns. EGD,  Sm bowel follow through (11/03), and an eteroclysis (10/03) were unrevealing. Cuases hypomag and hypocalcemia.  . Adnexal mass 8/03   s/p lap BSO (R ovarian fibroma) & lysis of adhesions  . Allergy    Seasonal  . Anemia    Multifactorial. Baseline HgB 10-11 ish. B12 def - 150 in 3/10. Fe Def - ferritin 35 3/10. Both are being repleted.  . Breast cancer ([place]) 03/16/13   right, 5 o'clock  . CAD (coronary artery disease) 1996   1996 - PTCA and angioplasty diagonal branch. 2000 - Rotoblator & angiopllasty of diagonal. 2006 - subendocardial AMI, DES to  proximal LAD.Marland Kitchen Also had 90% stenosis in distal apical LAD. EF 55 with apical hypokinesis. Indefinite ASA and Plavix.  . CHF (congestive heart failure) ([place])   . Chronic kidney disease    Chronic renal insuff baseline Cr 1.2 - 1.4 ish.  . Chronic pain    CT 10/10 = Spinal stenosis L2 - S1.  . Diabetes mellitus    Insulin dependent  . Hx of radiation therapy 06/02/13- 07/16/13   right rbeast 4500 cGy 25 sessions, right breast boost 1600 cGy in 8 sessions  . Hyperlipidemia    Managed with both a statin and Welchol. Welchol stopped 2014 2/2 cost and started on fenofibrate   . Hypertension    2006 B renal arteries patent. 2003 MRA - no RAS. 2003 pheo W/U Dr Hassell Done reportedly negative.  . Lupus ([place])   . Personal history of radiation therapy   . RBBB   . Stroke [facility])    Incidental finding MRI 2002 L lacunar infarct  . Wears dentures    top   Family History:  Family History  Problem Relation Age of Onset  . Pancreatic cancer Brother 4  . Cancer Mother   . Breast cancer Mother 30  . Lung cancer Maternal Aunt   . Breast cancer Maternal Aunt   . Prostate cancer Maternal Uncle   . Heart attack Maternal Grandmother   . Breast cancer Maternal Grandmother   . Kidney failure Maternal Aunt   . Hypertension Daughter    Social History:  Denies recent travel or surgeries. She is a never smoker and denies alcohol or illicit drug use. She denies a hx of known lung disease and is not on home oxygen. She reports compliance with her medications.  Review of Systems: A complete ROS was negative except as per HPI.  Physical Exam: - BP 104/53, temp 98.1, HR 70, RR 19, O2 100% on RA GEN: Well-nourished female sitting in chair. Alert and oriented. No acute distress.  HENT: [place]/AT. Moist mucous membranes. No visible lesions. EYES: PERRL. Sclera non-icteric. Conjunctiva clear. RESP: Diffuse crackles and rhonchi in anterior and posterior lung fields, no wheezes CV: Normal rate and regular rhythm. No  murmurs, gallops, or rubs. JVD to mid-neck. Nonpitting LE edema. ABD: Obese. Soft. Non-tender. Non-distended. Normoactive bowel sounds. EXT: Bilateral non-pitting LE edema. Warm and well perfused. NEURO: Cranial nerves II-XII grossly intact. Able to lift all four extremities against gravity. No apparent audiovisual hallucinations. Speech fluent and appropriate. PSYCH: Patient is calm and pleasant. Appropriate affect. Well-groomed; speech is appropriate and on-subject.  Labs CBC Latest Ref Rng & Units 06/19/2017 03/14/2017 12/19/2016  WBC 3.9 - 10.3 K/uL 7.8 6.1 9.9  Hemoglobin 11.6 - 15.9 g/dL 8.7(L) 9.3(L) 8.9(L)  Hematocrit 34.8 - 46.6 % 27.6(L) 29.2(L) 27.6(L)  Platelets 145 - 400 K/uL 296 292 312   CMP Latest Ref Rng & Units 07/23/2017 06/19/2017  03/14/2017  Glucose 65 - 99 mg/dL 84 121 177(H)  BUN 6 - 20 mg/dL 59(H) 40(H) 35.6(H)  Creatinine 0.44 - 1.00 mg/dL 2.31(H) 1.77(H) 1.8(H)  Sodium 135 - 145 mmol/L 141 143 140  Potassium 3.5 - 5.1 mmol/L 3.4(L) 3.8 3.8  Chloride 101 - 111 mmol/L 106 111(H) -  CO2 22 - 32 mmol/L _0 Calcium 8.9 - 10.3 mg/dL 9.0 9.7 9.6  Total Protein 6.4 - 8.3 g/dL - 7.7 7.8  Total Bilirubin 0.2 - 1.2 mg/dL - 0.2 0.28  Alkaline Phos 40 - 150 U/L - 48 61  AST 5 - 34 U/L - 13 17  ALT 0 - 55 U/L - 15 17   Troponin negative BNP 176  EKG: personally reviewed my interpretation is RBBB, normal PR and QTc interval, T wave inversions in lead III and V1 (unchanged compared to prior), no ST elevation  Assessment & Plan by Problem: Active Problems:   Chest pain  Ms. Seay is a 72yo female with PMH of HTN, DM, CKD, breast cancer (on anastrozole), multivessel CAD s/p DES to LAD in 2006 (on DAPT), and HFpEF (TTE 10/2016: LVEF 60-65%, grade 1 DD) who presents from clinic with cough and chest pain, noted to have new oxygen requirement in clinic. Differential includes PNA vs PE vs ACS vs viral pericarditis (particularly with 3 week hx of cough) vs CHF. BNP is elevated  above baseline and she does have an AKI with some signs of volume overload on exam which would be consistent with heart failure exacerbation. However, given her hx of cough and subjective fevers, PNA or viral etiology is also on the differential. Initial troponin negative and EKG without acute ischemic changes.  Pleuritic chest pain, cough Hypoxia Patient endorses 3 weeks of nonproductive cough without a hx of known lung disease or tobacco use. She developed sharp left-sided chest pain last night. She is afebrile, but noted to be hypoxic, requiring supplemental oxygen. Differential includes PNA vs PE vs ACS vs viral pericarditis vs CHF. She does have a history of breast cancer and is on anastrozole, which may increase risk of ischemic heart disease. Well's score is 0. - Admit to telemetry - Trend troponins - CXR - EKG in AM - Wean supplemental oxygen as able - CBC, BMP in AM  HFpEF (TTE 10/2016: LVEF 60-65%, grade 1 DD) Difficult to assess volume status clinically. Home regimen includes Lasix 18m daily, with which she reports compliance. Weight 188lbs, was 189 in July 2018. POCUS in clinic suggestive of B lines in lung fields, but also with easily collapsible IVC. Will f/u CXR prior to initiation of diuretic. - CXR - Daily weights - I/Os  CAD s/p DES to LAD in 2006 Endorses acute onset of chest pain yesterday. Initial troponin negative. EKG without acute ischemic changes. - Continue aspirin and Plavix - Continue home carvedilol 6.276mBID and atorvastatin 4077mHS - Trend troponins - EKG in AM  HTN Home regimen includes carvedilol 6.86m26mD and losartan 50mg35mly. Normotensive - Continue home carvedilol 6.86mg 55m- Hold home losartan 50mg d3m  DM Home regimen includes levemir 36 units daily, repaglinide 2mg TID24mnd linagliptin 5mg dail46m Levemir 20u QHS - SSI-M TID WC & HS - CBG monitoring  AKI on CKD Baseline Cr ~1.7-2. Cr on admission 2.31. - BMP in AM  Stage IA  right invasive ductal breast carcinoma, ER/PR+, HER2- s/p R breast lumpectomy and XRT in 2014 Declined adjuvant chemotherapy. S/p adjuvant breast radiation  in 2015. Currently on anastrozole. - Continue anastrozole 61m daily  Hx of hypertriglyceridemia - Continue home fenofibrate 543mdaily and atorvastatin 4053mHS  Hx of anemia (multifactorial, iron deficiency anemia) - Could consider IV iron, particularly in setting of HF   Diet: HH/CM VTE PPx: Lovenox 71m64mde Status: Full code Dispo: Admit patient to Inpatient with expected length of stay greater than 2 midnights.  Signed: HuanColbert Ewing 07/23/2017, 7:06 PM  Pager: [phone]: Paged that patient was desatting on 5L O2, changed to NRB with sats 92%. Daughter tells us tKoreat she has not been eating/drinking well recently. Stat CXR shows large infiltrate on left side without convincing evidence for pulmonary edema. Discussed with patient's family that her worsened respiratory status is likely 2/2 pneumonia. On exam, she appears volume down and POCUS performed in clinic suggestive of easily collapsible IVC. [name] (who is an NP in IM) would like fluids. - Start IV ceftriaxone and azithromycin - Gentle fluids - IVF NS 75cc/hr x6h - With IV abx and fluids, will monitor closely for volume overload - May need lasix - Will ask night team to re-assess

## 2017-07-23 NOTE — Progress Notes (Signed)
CC: Dizziness, chest pain  HPI:  [name] is a 72 y.o. female with PMHx detailed below presenting with new chest pain and hypoxia. She woke up around 2am this morning with a feeling of tightness over the front of her chest. Before this she had been feeling cough and like "something rattling" in her chest during the preceding 3 weeks. She denies any fevers or rashes. Her cough was sometimes productive of sputum. This pain is worst with lying supine. She was noted to be hypoxic on arrival to clinic which improved to 92% resting with head elevated and 3Lpm supplemental O2. Initial vital signs recorded a very low blood pressure but on repeat was 112/54 with a heart rate of 70. EKG obtained in clinic shows normal sinus rhythm with a right bundle branch block, not significant changed from previous tracing.  She has known CAD followed by Dr. Johnsie Cancel taking ASA and plavix with previous LAD stenting in 2006. She has heart failure with a preserved left ventricular ejection fraction on echocardiogram in August 2018. She has chronic stable lower extremity edema with known venous insufficiency.  See problem based assessment and plan below for additional details.  Chest pain [name] is high risk for myocardial infarction with her CAD history and diabetes. Initial EKG shows no STEMI and she reports compliance on antiplatelet therapy. Acute pulmonary embolus is possible although she has no recent provoking factors or lower extremity changes suggesting VTE. She has 3 weeks of intermittently productive cough that could represent atypical pneumonia or increasing pulmonary edema. She has chronic peripheral edema and we were unable to obtain an accurate clinic based weight due to her weakness and dyspnea.  Ultrasound examination showed grossly normal IVC respiratory variation and she does not have overt JVD. B lines were present throughout lung fields without any significant effusions.  Plan: -Admit to  hospital for further evaluation -She needs serial troponins to exclude atypical MI presentation in elderly diabetic woman with new chest pain and hypotension -Would recommend telemetry for occult arrhythmia -Chest xray PA and lateral -Would give diuretics and consider repeat TTE if BNP elevated, pulmonary edema pattern on CXR -If no infiltrative process on image would check D-dimer for low probability PE    Past Medical History:  Diagnosis Date  . Abdominal discomfort    Chronic N/V/D. Presumptive dx Crohn's dx per elevated p ANCA. Failed Entocort and Pentasa. Sep 2003 - ileocolectomy c anastomosis per Dr Deon Pilling 2/2 adhesions - path was hegative for Crohns. EGD, Sm bowel follow through (11/03), and an eteroclysis (10/03) were unrevealing. Cuases hypomag and hypocalcemia.  . Adnexal mass 8/03   s/p lap BSO (R ovarian fibroma) & lysis of adhesions  . Allergy    Seasonal  . Anemia    Multifactorial. Baseline HgB 10-11 ish. B12 def - 150 in 3/10. Fe Def - ferritin 35 3/10. Both are being repleted.  . Breast cancer ([place]) 03/16/13   right, 5 o'clock  . CAD (coronary artery disease) 1996   1996 - PTCA and angioplasty diagonal branch. 2000 - Rotoblator & angiopllasty of diagonal. 2006 - subendocardial AMI, DES to proximal LAD.Marland Kitchen Also had 90% stenosis in distal apical LAD. EF 55 with apical hypokinesis. Indefinite ASA and Plavix.  . CHF (congestive heart failure) ([place])   . Chronic kidney disease    Chronic renal insuff baseline Cr 1.2 - 1.4 ish.  . Chronic pain    CT 10/10 = Spinal stenosis L2 - S1.  . Diabetes mellitus  Insulin dependent  . Hx of radiation therapy 06/02/13- 07/16/13   right rbeast 4500 cGy 25 sessions, right breast boost 1600 cGy in 8 sessions  . Hyperlipidemia    Managed with both a statin and Welchol. Welchol stopped 2014 2/2 cost and started on fenofibrate   . Hypertension    2006 B renal arteries patent. 2003 MRA - no RAS. 2003 pheo W/U Dr Hassell Done reportedly negative.  .  Lupus ([place])   . Personal history of radiation therapy   . RBBB   . Stroke [facility])    Incidental finding MRI 2002 L lacunar infarct  . Wears dentures    top    Review of Systems: Review of Systems  Constitutional: Negative for chills and fever.  HENT: Negative for congestion.   Eyes: Negative for blurred vision.  Respiratory: Positive for cough, sputum production and shortness of breath. Negative for hemoptysis and wheezing.   Cardiovascular: Positive for chest pain and leg swelling. Negative for orthopnea.  Gastrointestinal: Negative for abdominal pain.  Genitourinary: Negative for dysuria.  Musculoskeletal: Negative for falls.  Skin: Negative for rash.  Neurological: Positive for dizziness.  Endo/Heme/Allergies: Negative for polydipsia.     Physical Exam: Vitals:   07/23/17 1106 07/23/17 1302 07/23/17 1503  BP:  115/63 (!) 104/53  Pulse: 72 71 70  Temp:   98.1 F (36.7 C)  TempSrc:   Oral  SpO2:  93% 100%  BP 112/54  GENERAL- alert, co-operative, NAD HEENT- Atraumatic, PERRL, EOMI, oral mucosa appears moist, good and intact dentition, no carotid bruit, no cervical LN enlargement. CARDIAC- RRR, no murmur, no prominent JVD RESP- Inspiratory crackles in bilateral lung bases, slightly decreased air entry, normal work of breathing ABDOMEN- Soft, nontender BACK- Normal curvature, no paraspinal tenderness, no CVA tenderness. NEURO- Sensation intact globally EXTREMITIES- 1+ pitting edema in legs bilaterally SKIN- Warm, dry, No rash or lesion. PSYCH- Tired appearing, appropriate speech    Assessment & Plan:   See encounters tab for problem based medical decision making.   Patient seen with Dr. Angelia Mould

## 2017-07-23 NOTE — Progress Notes (Signed)
ANTIBIOTIC CONSULT NOTE - INITIAL  Pharmacy Consult for Ceftriaxone  Indication: Community acquired PNA  Allergies  Allergen Reactions  . Percocet [Oxycodone-Acetaminophen] Other (See Comments)    headaches  . Diazepam Other (See Comments)    REACTION: Agitation  . Haloperidol Lactate Nausea And Vomiting  . Lorazepam Nausea Only  . Morphine Sulfate Other (See Comments)    REACTION: Agitation  . Propoxyphene Hcl Nausea And Vomiting  . Tramadol Hcl Nausea And Vomiting    Patient Measurements: Height: 5' 4"  (162.6 cm) Weight: 188 lb 3.2 oz (85.4 kg) IBW/kg (Calculated) : 54.7   Vital Signs: Temp: 98.3 F (36.8 C) (05/07 1749) Temp Source: Oral (05/07 1749) BP: 108/59 (05/07 1749) Pulse Rate: 77 (05/07 1749) Intake/Output from previous day: No intake/output data recorded. Intake/Output from this shift: No intake/output data recorded.  Labs: Recent Labs    07/23/17 1214  CREATININE 2.31*   Estimated Creatinine Clearance: 24 mL/min (A) (by C-G formula based on SCr of 2.31 mg/dL (H)). No results for input(s): VANCOTROUGH, VANCOPEAK, VANCORANDOM, GENTTROUGH, GENTPEAK, GENTRANDOM, TOBRATROUGH, TOBRAPEAK, TOBRARND, AMIKACINPEAK, AMIKACINTROU, AMIKACIN in the last 72 hours.   Microbiology: No results found for this or any previous visit (from the past 720 hour(s)).  Medical History: Past Medical History:  Diagnosis Date  . Abdominal discomfort    Chronic N/V/D. Presumptive dx Crohn's dx per elevated p ANCA. Failed Entocort and Pentasa. Sep 2003 - ileocolectomy c anastomosis per Dr Deon Pilling 2/2 adhesions - path was hegative for Crohns. EGD, Sm bowel follow through (11/03), and an eteroclysis (10/03) were unrevealing. Cuases hypomag and hypocalcemia.  . Adnexal mass 8/03   s/p lap BSO (R ovarian fibroma) & lysis of adhesions  . Allergy    Seasonal  . Anemia    Multifactorial. Baseline HgB 10-11 ish. B12 def - 150 in 3/10. Fe Def - ferritin 35 3/10. Both are being repleted.   . Breast cancer ([place]) 03/16/13   right, 5 o'clock  . CAD (coronary artery disease) 1996   1996 - PTCA and angioplasty diagonal branch. 2000 - Rotoblator & angiopllasty of diagonal. 2006 - subendocardial AMI, DES to proximal LAD.Marland Kitchen Also had 90% stenosis in distal apical LAD. EF 55 with apical hypokinesis. Indefinite ASA and Plavix.  . CHF (congestive heart failure) ([place])   . Chronic kidney disease    Chronic renal insuff baseline Cr 1.2 - 1.4 ish.  . Chronic pain    CT 10/10 = Spinal stenosis L2 - S1.  . Diabetes mellitus    Insulin dependent  . Hx of radiation therapy 06/02/13- 07/16/13   right rbeast 4500 cGy 25 sessions, right breast boost 1600 cGy in 8 sessions  . Hyperlipidemia    Managed with both a statin and Welchol. Welchol stopped 2014 2/2 cost and started on fenofibrate   . Hypertension    2006 B renal arteries patent. 2003 MRA - no RAS. 2003 pheo W/U Dr Hassell Done reportedly negative.  . Lupus ([place])   . Personal history of radiation therapy   . RBBB   . Stroke [facility])    Incidental finding MRI 2002 L lacunar infarct  . Wears dentures    top     Assessment: 72 yo female with presumed CAP. Pharmacy asked to dose Ceftriaxone.   Plan:  Ceftriaxone 2gm IV q24h Pharmacy will sign off.   Taj Nevins A. Levada Dy, PharmD, [place] Pager: [phone]  07/23/2017,7:11 PM

## 2017-07-23 NOTE — Progress Notes (Addendum)
Patient on 4L [place], O2 sats 84-89%.  O2 increased to 5L with little change.  Patient changed to Venti mask, 02 sats still in the mid to upper 80's.  Patient transitioned to non rebreather.  O2 Sats 94.  Dr Ronalee Red notified.

## 2017-07-24 ENCOUNTER — Observation Stay (HOSPITAL_COMMUNITY): Payer: Medicare Other

## 2017-07-24 ENCOUNTER — Encounter (HOSPITAL_COMMUNITY): Payer: Self-pay | Admitting: General Practice

## 2017-07-24 ENCOUNTER — Encounter (HOSPITAL_COMMUNITY)
Admission: AD | Disposition: A | Discharge status: Rehabilitation Facility | Payer: Self-pay | Source: Ambulatory Visit | Attending: Internal Medicine

## 2017-07-24 DIAGNOSIS — E785 Hyperlipidemia, unspecified: Secondary | ICD-10-CM | POA: Diagnosis not present

## 2017-07-24 DIAGNOSIS — R350 Frequency of micturition: Secondary | ICD-10-CM | POA: Diagnosis not present

## 2017-07-24 DIAGNOSIS — R1312 Dysphagia, oropharyngeal phase: Secondary | ICD-10-CM | POA: Diagnosis not present

## 2017-07-24 DIAGNOSIS — G9340 Encephalopathy, unspecified: Secondary | ICD-10-CM | POA: Diagnosis not present

## 2017-07-24 DIAGNOSIS — E1142 Type 2 diabetes mellitus with diabetic polyneuropathy: Secondary | ICD-10-CM | POA: Diagnosis not present

## 2017-07-24 DIAGNOSIS — Z803 Family history of malignant neoplasm of breast: Secondary | ICD-10-CM | POA: Diagnosis not present

## 2017-07-24 DIAGNOSIS — Z888 Allergy status to other drugs, medicaments and biological substances status: Secondary | ICD-10-CM | POA: Diagnosis not present

## 2017-07-24 DIAGNOSIS — J984 Other disorders of lung: Secondary | ICD-10-CM | POA: Diagnosis not present

## 2017-07-24 DIAGNOSIS — G479 Sleep disorder, unspecified: Secondary | ICD-10-CM | POA: Diagnosis not present

## 2017-07-24 DIAGNOSIS — A419 Sepsis, unspecified organism: Secondary | ICD-10-CM | POA: Diagnosis not present

## 2017-07-24 DIAGNOSIS — I10 Essential (primary) hypertension: Secondary | ICD-10-CM | POA: Diagnosis not present

## 2017-07-24 DIAGNOSIS — E11649 Type 2 diabetes mellitus with hypoglycemia without coma: Secondary | ICD-10-CM | POA: Diagnosis present

## 2017-07-24 DIAGNOSIS — R042 Hemoptysis: Secondary | ICD-10-CM | POA: Diagnosis not present

## 2017-07-24 DIAGNOSIS — R0489 Hemorrhage from other sites in respiratory passages: Secondary | ICD-10-CM | POA: Diagnosis not present

## 2017-07-24 DIAGNOSIS — I4510 Unspecified right bundle-branch block: Secondary | ICD-10-CM | POA: Diagnosis not present

## 2017-07-24 DIAGNOSIS — D72829 Elevated white blood cell count, unspecified: Secondary | ICD-10-CM | POA: Diagnosis not present

## 2017-07-24 DIAGNOSIS — J181 Lobar pneumonia, unspecified organism: Secondary | ICD-10-CM | POA: Diagnosis not present

## 2017-07-24 DIAGNOSIS — A499 Bacterial infection, unspecified: Secondary | ICD-10-CM | POA: Diagnosis not present

## 2017-07-24 DIAGNOSIS — R509 Fever, unspecified: Secondary | ICD-10-CM | POA: Diagnosis not present

## 2017-07-24 DIAGNOSIS — J9601 Acute respiratory failure with hypoxia: Secondary | ICD-10-CM | POA: Diagnosis not present

## 2017-07-24 DIAGNOSIS — R7309 Other abnormal glucose: Secondary | ICD-10-CM | POA: Diagnosis not present

## 2017-07-24 DIAGNOSIS — R579 Shock, unspecified: Secondary | ICD-10-CM

## 2017-07-24 DIAGNOSIS — N182 Chronic kidney disease, stage 2 (mild): Secondary | ICD-10-CM | POA: Diagnosis not present

## 2017-07-24 DIAGNOSIS — N189 Chronic kidney disease, unspecified: Secondary | ICD-10-CM | POA: Diagnosis not present

## 2017-07-24 DIAGNOSIS — E870 Hyperosmolality and hypernatremia: Secondary | ICD-10-CM | POA: Diagnosis not present

## 2017-07-24 DIAGNOSIS — I130 Hypertensive heart and chronic kidney disease with heart failure and stage 1 through stage 4 chronic kidney disease, or unspecified chronic kidney disease: Secondary | ICD-10-CM | POA: Diagnosis not present

## 2017-07-24 DIAGNOSIS — C799 Secondary malignant neoplasm of unspecified site: Secondary | ICD-10-CM | POA: Diagnosis present

## 2017-07-24 DIAGNOSIS — Z853 Personal history of malignant neoplasm of breast: Secondary | ICD-10-CM | POA: Diagnosis not present

## 2017-07-24 DIAGNOSIS — Y95 Nosocomial condition: Secondary | ICD-10-CM | POA: Diagnosis present

## 2017-07-24 DIAGNOSIS — Z7902 Long term (current) use of antithrombotics/antiplatelets: Secondary | ICD-10-CM | POA: Diagnosis not present

## 2017-07-24 DIAGNOSIS — Z9911 Dependence on respirator [ventilator] status: Secondary | ICD-10-CM | POA: Diagnosis not present

## 2017-07-24 DIAGNOSIS — T17990A Other foreign object in respiratory tract, part unspecified in causing asphyxiation, initial encounter: Secondary | ICD-10-CM | POA: Diagnosis present

## 2017-07-24 DIAGNOSIS — Z9049 Acquired absence of other specified parts of digestive tract: Secondary | ICD-10-CM | POA: Diagnosis not present

## 2017-07-24 DIAGNOSIS — R0902 Hypoxemia: Secondary | ICD-10-CM

## 2017-07-24 DIAGNOSIS — R6521 Severe sepsis with septic shock: Secondary | ICD-10-CM

## 2017-07-24 DIAGNOSIS — R079 Chest pain, unspecified: Secondary | ICD-10-CM | POA: Diagnosis not present

## 2017-07-24 DIAGNOSIS — K5090 Crohn's disease, unspecified, without complications: Secondary | ICD-10-CM | POA: Diagnosis not present

## 2017-07-24 DIAGNOSIS — Z452 Encounter for adjustment and management of vascular access device: Secondary | ICD-10-CM | POA: Diagnosis not present

## 2017-07-24 DIAGNOSIS — I469 Cardiac arrest, cause unspecified: Secondary | ICD-10-CM

## 2017-07-24 DIAGNOSIS — M7989 Other specified soft tissue disorders: Secondary | ICD-10-CM | POA: Diagnosis not present

## 2017-07-24 DIAGNOSIS — D638 Anemia in other chronic diseases classified elsewhere: Secondary | ICD-10-CM | POA: Diagnosis not present

## 2017-07-24 DIAGNOSIS — Z4682 Encounter for fitting and adjustment of non-vascular catheter: Secondary | ICD-10-CM | POA: Diagnosis not present

## 2017-07-24 DIAGNOSIS — L03114 Cellulitis of left upper limb: Secondary | ICD-10-CM | POA: Diagnosis not present

## 2017-07-24 DIAGNOSIS — J690 Pneumonitis due to inhalation of food and vomit: Secondary | ICD-10-CM | POA: Diagnosis not present

## 2017-07-24 DIAGNOSIS — I2510 Atherosclerotic heart disease of native coronary artery without angina pectoris: Secondary | ICD-10-CM | POA: Diagnosis not present

## 2017-07-24 DIAGNOSIS — R443 Hallucinations, unspecified: Secondary | ICD-10-CM | POA: Diagnosis not present

## 2017-07-24 DIAGNOSIS — K567 Ileus, unspecified: Secondary | ICD-10-CM | POA: Diagnosis not present

## 2017-07-24 DIAGNOSIS — G9341 Metabolic encephalopathy: Secondary | ICD-10-CM | POA: Diagnosis not present

## 2017-07-24 DIAGNOSIS — Z923 Personal history of irradiation: Secondary | ICD-10-CM | POA: Diagnosis not present

## 2017-07-24 DIAGNOSIS — J80 Acute respiratory distress syndrome: Secondary | ICD-10-CM | POA: Diagnosis not present

## 2017-07-24 DIAGNOSIS — Z8249 Family history of ischemic heart disease and other diseases of the circulatory system: Secondary | ICD-10-CM | POA: Diagnosis not present

## 2017-07-24 DIAGNOSIS — I248 Other forms of acute ischemic heart disease: Secondary | ICD-10-CM | POA: Diagnosis present

## 2017-07-24 DIAGNOSIS — R570 Cardiogenic shock: Secondary | ICD-10-CM | POA: Diagnosis not present

## 2017-07-24 DIAGNOSIS — R5381 Other malaise: Secondary | ICD-10-CM | POA: Diagnosis not present

## 2017-07-24 DIAGNOSIS — R918 Other nonspecific abnormal finding of lung field: Secondary | ICD-10-CM | POA: Diagnosis not present

## 2017-07-24 DIAGNOSIS — N179 Acute kidney failure, unspecified: Secondary | ICD-10-CM | POA: Diagnosis present

## 2017-07-24 DIAGNOSIS — E1169 Type 2 diabetes mellitus with other specified complication: Secondary | ICD-10-CM | POA: Diagnosis not present

## 2017-07-24 DIAGNOSIS — E872 Acidosis: Secondary | ICD-10-CM | POA: Diagnosis present

## 2017-07-24 DIAGNOSIS — J40 Bronchitis, not specified as acute or chronic: Secondary | ICD-10-CM | POA: Diagnosis not present

## 2017-07-24 DIAGNOSIS — E1122 Type 2 diabetes mellitus with diabetic chronic kidney disease: Secondary | ICD-10-CM | POA: Diagnosis not present

## 2017-07-24 DIAGNOSIS — R0602 Shortness of breath: Secondary | ICD-10-CM

## 2017-07-24 DIAGNOSIS — I5032 Chronic diastolic (congestive) heart failure: Secondary | ICD-10-CM | POA: Diagnosis present

## 2017-07-24 DIAGNOSIS — Z800 Family history of malignant neoplasm of digestive organs: Secondary | ICD-10-CM | POA: Diagnosis not present

## 2017-07-24 DIAGNOSIS — R748 Abnormal levels of other serum enzymes: Secondary | ICD-10-CM | POA: Diagnosis not present

## 2017-07-24 DIAGNOSIS — I169 Hypertensive crisis, unspecified: Secondary | ICD-10-CM | POA: Diagnosis not present

## 2017-07-24 DIAGNOSIS — N183 Chronic kidney disease, stage 3 (moderate): Secondary | ICD-10-CM | POA: Diagnosis present

## 2017-07-24 DIAGNOSIS — R7989 Other specified abnormal findings of blood chemistry: Secondary | ICD-10-CM | POA: Diagnosis not present

## 2017-07-24 DIAGNOSIS — D72823 Leukemoid reaction: Secondary | ICD-10-CM | POA: Diagnosis not present

## 2017-07-24 DIAGNOSIS — I5030 Unspecified diastolic (congestive) heart failure: Secondary | ICD-10-CM | POA: Diagnosis not present

## 2017-07-24 DIAGNOSIS — N390 Urinary tract infection, site not specified: Secondary | ICD-10-CM | POA: Diagnosis not present

## 2017-07-24 DIAGNOSIS — Z90710 Acquired absence of both cervix and uterus: Secondary | ICD-10-CM | POA: Diagnosis not present

## 2017-07-24 DIAGNOSIS — R1310 Dysphagia, unspecified: Secondary | ICD-10-CM | POA: Diagnosis not present

## 2017-07-24 DIAGNOSIS — I340 Nonrheumatic mitral (valve) insufficiency: Secondary | ICD-10-CM | POA: Diagnosis not present

## 2017-07-24 DIAGNOSIS — T383X5A Adverse effect of insulin and oral hypoglycemic [antidiabetic] drugs, initial encounter: Secondary | ICD-10-CM | POA: Diagnosis not present

## 2017-07-24 DIAGNOSIS — E1165 Type 2 diabetes mellitus with hyperglycemia: Secondary | ICD-10-CM | POA: Diagnosis not present

## 2017-07-24 DIAGNOSIS — D62 Acute posthemorrhagic anemia: Secondary | ICD-10-CM | POA: Diagnosis not present

## 2017-07-24 DIAGNOSIS — Z7982 Long term (current) use of aspirin: Secondary | ICD-10-CM | POA: Diagnosis not present

## 2017-07-24 DIAGNOSIS — C50911 Malignant neoplasm of unspecified site of right female breast: Secondary | ICD-10-CM | POA: Diagnosis not present

## 2017-07-24 DIAGNOSIS — E876 Hypokalemia: Secondary | ICD-10-CM | POA: Diagnosis not present

## 2017-07-24 DIAGNOSIS — E669 Obesity, unspecified: Secondary | ICD-10-CM | POA: Diagnosis not present

## 2017-07-24 DIAGNOSIS — I361 Nonrheumatic tricuspid (valve) insufficiency: Secondary | ICD-10-CM | POA: Diagnosis not present

## 2017-07-24 DIAGNOSIS — Z801 Family history of malignant neoplasm of trachea, bronchus and lung: Secondary | ICD-10-CM | POA: Diagnosis not present

## 2017-07-24 DIAGNOSIS — Z885 Allergy status to narcotic agent status: Secondary | ICD-10-CM | POA: Diagnosis not present

## 2017-07-24 DIAGNOSIS — J189 Pneumonia, unspecified organism: Secondary | ICD-10-CM | POA: Diagnosis not present

## 2017-07-24 DIAGNOSIS — I5022 Chronic systolic (congestive) heart failure: Secondary | ICD-10-CM | POA: Diagnosis not present

## 2017-07-24 DIAGNOSIS — R140 Abdominal distension (gaseous): Secondary | ICD-10-CM | POA: Diagnosis not present

## 2017-07-24 DIAGNOSIS — R0989 Other specified symptoms and signs involving the circulatory and respiratory systems: Secondary | ICD-10-CM | POA: Diagnosis not present

## 2017-07-24 DIAGNOSIS — R001 Bradycardia, unspecified: Secondary | ICD-10-CM | POA: Diagnosis present

## 2017-07-24 DIAGNOSIS — Z955 Presence of coronary angioplasty implant and graft: Secondary | ICD-10-CM | POA: Diagnosis not present

## 2017-07-24 DIAGNOSIS — E160 Drug-induced hypoglycemia without coma: Secondary | ICD-10-CM | POA: Diagnosis not present

## 2017-07-24 LAB — ABO/RH: ABO/RH(D): B NEG

## 2017-07-24 LAB — CBC WITH DIFFERENTIAL/PLATELET
BASOS ABS: 0 10*3/uL (ref 0.0–0.1)
Basophils Relative: 0 %
Eosinophils Absolute: 0 10*3/uL (ref 0.0–0.7)
Eosinophils Relative: 2 %
HCT: 21.2 % — ABNORMAL LOW (ref 36.0–46.0)
HEMOGLOBIN: 6.6 g/dL — AB (ref 12.0–15.0)
LYMPHS PCT: 25 %
Lymphs Abs: 0.6 10*3/uL — ABNORMAL LOW (ref 0.7–4.0)
MCH: 24.5 pg — ABNORMAL LOW (ref 26.0–34.0)
MCHC: 31.1 g/dL (ref 30.0–36.0)
MCV: 78.8 fL (ref 78.0–100.0)
MONOS PCT: 1 %
Monocytes Absolute: 0 10*3/uL — ABNORMAL LOW (ref 0.1–1.0)
Neutro Abs: 1.6 10*3/uL — ABNORMAL LOW (ref 1.7–7.7)
Neutrophils Relative %: 72 %
Platelets: 270 10*3/uL (ref 150–400)
RBC: 2.69 MIL/uL — AB (ref 3.87–5.11)
RDW: 15.8 % — ABNORMAL HIGH (ref 11.5–15.5)
WBC: 2.2 10*3/uL — AB (ref 4.0–10.5)

## 2017-07-24 LAB — CBC
HEMATOCRIT: 23.7 % — AB (ref 36.0–46.0)
Hemoglobin: 7.3 g/dL — ABNORMAL LOW (ref 12.0–15.0)
MCH: 24.3 pg — AB (ref 26.0–34.0)
MCHC: 30.8 g/dL (ref 30.0–36.0)
MCV: 78.7 fL (ref 78.0–100.0)
Platelets: 295 10*3/uL (ref 150–400)
RBC: 3.01 MIL/uL — ABNORMAL LOW (ref 3.87–5.11)
RDW: 15.8 % — AB (ref 11.5–15.5)
WBC: 2.7 10*3/uL — ABNORMAL LOW (ref 4.0–10.5)

## 2017-07-24 LAB — POCT I-STAT 3, ART BLOOD GAS (G3+)
ACID-BASE DEFICIT: 8 mmol/L — AB (ref 0.0–2.0)
BICARBONATE: 19.4 mmol/L — AB (ref 20.0–28.0)
O2 Saturation: 74 %
PO2 ART: 47 mmHg — AB (ref 83.0–108.0)
TCO2: 21 mmol/L — AB (ref 22–32)
pCO2 arterial: 48 mmHg (ref 32.0–48.0)
pH, Arterial: 7.214 — ABNORMAL LOW (ref 7.350–7.450)

## 2017-07-24 LAB — BASIC METABOLIC PANEL
Anion gap: 9 (ref 5–15)
Anion gap: 12 (ref 5–15)
BUN: 57 mg/dL — ABNORMAL HIGH (ref 6–20)
BUN: 62 mg/dL — AB (ref 6–20)
CALCIUM: 8.2 mg/dL — AB (ref 8.9–10.3)
CHLORIDE: 109 mmol/L (ref 101–111)
CHLORIDE: 107 mmol/L (ref 101–111)
CO2: 20 mmol/L — AB (ref 22–32)
CO2: 22 mmol/L (ref 22–32)
CREATININE: 2.77 mg/dL — AB (ref 0.44–1.00)
Calcium: 7.4 mg/dL — ABNORMAL LOW (ref 8.9–10.3)
Creatinine, Ser: 2.43 mg/dL — ABNORMAL HIGH (ref 0.44–1.00)
GFR calc Af Amer: 22 mL/min — ABNORMAL LOW (ref >60)
GFR calc non Af Amer: 19 mL/min — ABNORMAL LOW (ref >60)
GFR calc non Af Amer: 16 mL/min — ABNORMAL LOW (ref >60)
GFR, EST AFRICAN AMERICAN: 19 mL/min — AB (ref >60)
GLUCOSE: 96 mg/dL (ref 65–99)
Glucose, Bld: 99 mg/dL (ref 65–99)
POTASSIUM: 6.7 mmol/L — AB (ref 3.5–5.1)
Potassium: 4 mmol/L (ref 3.5–5.1)
Sodium: 138 mmol/L (ref 135–145)
Sodium: 141 mmol/L (ref 135–145)

## 2017-07-24 LAB — BLOOD GAS, ARTERIAL
ACID-BASE DEFICIT: 7.4 mmol/L — AB (ref 0.0–2.0)
Acid-base deficit: 0.6 mmol/L (ref 0.0–2.0)
BICARBONATE: 18.3 mmol/L — AB (ref 20.0–28.0)
Bicarbonate: 23.3 mmol/L (ref 20.0–28.0)
DRAWN BY: 270271
Drawn by: 33100
FIO2: 50
FIO2: 100
O2 SAT: 84.6 %
O2 Saturation: 77.5 %
PATIENT TEMPERATURE: 98.6
PEEP/CPAP: 14 cmH2O
PH ART: 7.27 — AB (ref 7.350–7.450)
PO2 ART: 52.9 mmHg — AB (ref 83.0–108.0)
Patient temperature: 98.4
RATE: 30 resp/min
VT: 440 mL
pCO2 arterial: 36.7 mmHg (ref 32.0–48.0)
pCO2 arterial: 41.2 mmHg (ref 32.0–48.0)
pH, Arterial: 7.418 (ref 7.350–7.450)
pO2, Arterial: 52.2 mmHg — ABNORMAL LOW (ref 83.0–108.0)

## 2017-07-24 LAB — STREP PNEUMONIAE URINARY ANTIGEN: Strep Pneumo Urinary Antigen: NEGATIVE

## 2017-07-24 LAB — GLUCOSE, CAPILLARY
GLUCOSE-CAPILLARY: 85 mg/dL (ref 65–99)
GLUCOSE-CAPILLARY: 186 mg/dL — AB (ref 65–99)
GLUCOSE-CAPILLARY: 264 mg/dL — AB (ref 65–99)
Glucose-Capillary: 88 mg/dL (ref 65–99)
Glucose-Capillary: 108 mg/dL — ABNORMAL HIGH (ref 65–99)

## 2017-07-24 LAB — COMPREHENSIVE METABOLIC PANEL
ALT: 47 U/L (ref 14–54)
ANION GAP: 17 — AB (ref 5–15)
AST: 145 U/L — ABNORMAL HIGH (ref 15–41)
Albumin: 2.3 g/dL — ABNORMAL LOW (ref 3.5–5.0)
Alkaline Phosphatase: 69 U/L (ref 38–126)
BILIRUBIN TOTAL: 0.9 mg/dL (ref 0.3–1.2)
BUN: 66 mg/dL — ABNORMAL HIGH (ref 6–20)
CO2: 18 mmol/L — ABNORMAL LOW (ref 22–32)
Calcium: 8.1 mg/dL — ABNORMAL LOW (ref 8.9–10.3)
Chloride: 106 mmol/L (ref 101–111)
Creatinine, Ser: 3.08 mg/dL — ABNORMAL HIGH (ref 0.44–1.00)
GFR, EST AFRICAN AMERICAN: 17 mL/min — AB (ref >60)
GFR, EST NON AFRICAN AMERICAN: 14 mL/min — AB (ref >60)
Glucose, Bld: 98 mg/dL (ref 65–99)
Potassium: 4.1 mmol/L (ref 3.5–5.1)
Sodium: 141 mmol/L (ref 135–145)
TOTAL PROTEIN: 6.4 g/dL — AB (ref 6.5–8.1)

## 2017-07-24 LAB — C-REACTIVE PROTEIN: CRP: 28 mg/dL — AB (ref <1.0)

## 2017-07-24 LAB — LACTIC ACID, PLASMA
LACTIC ACID, VENOUS: 3 mmol/L — AB (ref 0.5–1.9)
Lactic Acid, Venous: 3 mmol/L (ref 0.5–1.9)
Lactic Acid, Venous: 7 mmol/L (ref 0.5–1.9)

## 2017-07-24 LAB — TROPONIN I
TROPONIN I: 0.14 ng/mL — AB (ref <0.03)
Troponin I: <0.03 ng/mL (ref <0.03)
Troponin I: 0.03 ng/mL (ref <0.03)

## 2017-07-24 LAB — TRIGLYCERIDES: Triglycerides: 107 mg/dL (ref <150)

## 2017-07-24 LAB — SEDIMENTATION RATE: Sed Rate: 117 mm/hr — ABNORMAL HIGH (ref 0–22)

## 2017-07-24 LAB — MRSA PCR SCREENING: MRSA by PCR: NEGATIVE

## 2017-07-24 LAB — MAGNESIUM: MAGNESIUM: 1.4 mg/dL — AB (ref 1.7–2.4)

## 2017-07-24 LAB — PROCALCITONIN: PROCALCITONIN: 130.72 ng/mL

## 2017-07-24 SURGERY — TEMPORARY PACEMAKER
Anesthesia: LOCAL

## 2017-07-24 MED ORDER — IPRATROPIUM-ALBUTEROL 0.5-2.5 (3) MG/3ML IN SOLN
3.0000 mL | RESPIRATORY_TRACT | Status: DC
  Administered 2017-07-24 – 2017-07-31 (×41): 3 mL via RESPIRATORY_TRACT
  Filled 2017-07-24 (×41): qty 3

## 2017-07-24 MED ORDER — FAMOTIDINE IN NACL 20-0.9 MG/50ML-% IV SOLN
20.0000 mg | INTRAVENOUS | Status: DC
  Administered 2017-07-24 – 2017-08-02 (×10): 20 mg via INTRAVENOUS
  Filled 2017-07-24 (×12): qty 50

## 2017-07-24 MED ORDER — EPINEPHRINE PF 1 MG/ML IJ SOLN
0.5000 ug/min | INTRAVENOUS | Status: DC
  Filled 2017-07-24: qty 4

## 2017-07-24 MED ORDER — SODIUM BICARBONATE 8.4 % IV SOLN
INTRAVENOUS | Status: DC
  Administered 2017-07-24: 16:00:00 via INTRAVENOUS
  Filled 2017-07-24 (×3): qty 150

## 2017-07-24 MED ORDER — VASOPRESSIN 20 UNIT/ML IV SOLN
0.0300 [IU]/min | INTRAVENOUS | Status: DC
  Administered 2017-07-24: 0.03 [IU]/min via INTRAVENOUS
  Filled 2017-07-24 (×3): qty 2

## 2017-07-24 MED ORDER — FENTANYL CITRATE (PF) 100 MCG/2ML IJ SOLN
INTRAMUSCULAR | Status: AC
  Filled 2017-07-24: qty 2

## 2017-07-24 MED ORDER — ASPIRIN 81 MG PO CHEW: 81.0000 mg | CHEWABLE_TABLET | Freq: Every day | ORAL | Status: DC

## 2017-07-24 MED ORDER — MIDAZOLAM HCL 2 MG/2ML IJ SOLN
INTRAMUSCULAR | Status: AC
  Filled 2017-07-24: qty 2

## 2017-07-24 MED ORDER — DIPHENHYDRAMINE HCL 50 MG/ML IJ SOLN
INTRAMUSCULAR | Status: AC
  Filled 2017-07-24: qty 1

## 2017-07-24 MED ORDER — DIPHENHYDRAMINE HCL 50 MG/ML IJ SOLN
25.0000 mg | Freq: Every evening | INTRAMUSCULAR | Status: DC | PRN
  Administered 2017-07-24: 25 mg via INTRAVENOUS

## 2017-07-24 MED ORDER — PROPOFOL 1000 MG/100ML IV EMUL
5.0000 ug/kg/min | INTRAVENOUS | Status: DC
  Administered 2017-07-24 – 2017-07-25 (×3): 20 ug/kg/min via INTRAVENOUS
  Filled 2017-07-24 (×4): qty 100

## 2017-07-24 MED ORDER — POTASSIUM CHLORIDE 10 MEQ/100ML IV SOLN
10.0000 meq | INTRAVENOUS | Status: AC
  Administered 2017-07-24 (×3): 10 meq via INTRAVENOUS
  Filled 2017-07-24 (×3): qty 100

## 2017-07-24 MED ORDER — ETOMIDATE 2 MG/ML IV SOLN
20.0000 mg | Freq: Once | INTRAVENOUS | Status: AC
  Administered 2017-07-24: 20 mg via INTRAVENOUS

## 2017-07-24 MED ORDER — CHLORHEXIDINE GLUCONATE 0.12% ORAL RINSE (MEDLINE KIT): 15.0000 mL | Freq: Two times a day (BID) | OROMUCOSAL | Status: DC

## 2017-07-24 MED ORDER — ORAL CARE MOUTH RINSE
15.0000 mL | Freq: Two times a day (BID) | OROMUCOSAL | Status: DC
  Administered 2017-07-24: 15 mL via OROMUCOSAL

## 2017-07-24 MED ORDER — NOREPINEPHRINE BITARTRATE 1 MG/ML IV SOLN
0.0000 ug/min | INTRAVENOUS | Status: DC
  Administered 2017-07-24: 20 ug/min via INTRAVENOUS
  Filled 2017-07-24: qty 4

## 2017-07-24 MED ORDER — ORAL CARE MOUTH RINSE
15.0000 mL | OROMUCOSAL | Status: DC
  Administered 2017-07-24 – 2017-07-31 (×68): 15 mL via OROMUCOSAL

## 2017-07-24 MED ORDER — PROPOFOL 500 MG/50ML IV EMUL
INTRAVENOUS | Status: AC
  Filled 2017-07-24: qty 50

## 2017-07-24 MED ORDER — ALBUTEROL SULFATE (2.5 MG/3ML) 0.083% IN NEBU
2.5000 mg | INHALATION_SOLUTION | RESPIRATORY_TRACT | Status: DC | PRN
Start: 2017-07-24 — End: 2017-08-06

## 2017-07-24 MED ORDER — FENTANYL BOLUS VIA INFUSION
25.0000 ug | INTRAVENOUS | Status: DC | PRN
  Administered 2017-07-26 – 2017-07-28 (×4): 25 ug via INTRAVENOUS
  Filled 2017-07-24: qty 25

## 2017-07-24 MED ORDER — CLOPIDOGREL BISULFATE 75 MG PO TABS: 75.0000 mg | ORAL_TABLET | Freq: Every day | ORAL | Status: DC

## 2017-07-24 MED ORDER — INSULIN ASPART 100 UNIT/ML ~~LOC~~ SOLN
0.0000 [IU] | SUBCUTANEOUS | Status: DC
  Administered 2017-07-24: 4 [IU] via SUBCUTANEOUS
  Administered 2017-07-25: 7 [IU] via SUBCUTANEOUS
  Administered 2017-07-25: 4 [IU] via SUBCUTANEOUS
  Administered 2017-07-25: 11 [IU] via SUBCUTANEOUS
  Administered 2017-07-25: 4 [IU] via SUBCUTANEOUS
  Administered 2017-07-25: 7 [IU] via SUBCUTANEOUS
  Administered 2017-07-25: 11 [IU] via SUBCUTANEOUS
  Administered 2017-07-26 (×2): 7 [IU] via SUBCUTANEOUS
  Administered 2017-07-26: 11 [IU] via SUBCUTANEOUS
  Administered 2017-07-26: 7 [IU] via SUBCUTANEOUS
  Administered 2017-07-26 (×2): 11 [IU] via SUBCUTANEOUS
  Administered 2017-07-27: 7 [IU] via SUBCUTANEOUS
  Administered 2017-07-27: 11 [IU] via SUBCUTANEOUS
  Administered 2017-07-27: 7 [IU] via SUBCUTANEOUS
  Administered 2017-07-27: 4 [IU] via SUBCUTANEOUS
  Administered 2017-07-27: 7 [IU] via SUBCUTANEOUS
  Administered 2017-07-27: 11 [IU] via SUBCUTANEOUS
  Administered 2017-07-28 (×6): 7 [IU] via SUBCUTANEOUS
  Administered 2017-07-29: 4 [IU] via SUBCUTANEOUS
  Administered 2017-07-29: 11 [IU] via SUBCUTANEOUS
  Administered 2017-07-29 (×4): 7 [IU] via SUBCUTANEOUS
  Administered 2017-07-30 (×4): 4 [IU] via SUBCUTANEOUS
  Administered 2017-07-30 – 2017-07-31 (×3): 11 [IU] via SUBCUTANEOUS
  Administered 2017-07-31: 7 [IU] via SUBCUTANEOUS
  Administered 2017-07-31 (×2): 11 [IU] via SUBCUTANEOUS
  Administered 2017-07-31: 7 [IU] via SUBCUTANEOUS
  Administered 2017-07-31 – 2017-08-01 (×3): 11 [IU] via SUBCUTANEOUS
  Administered 2017-08-01: 4 [IU] via SUBCUTANEOUS
  Administered 2017-08-01: 7 [IU] via SUBCUTANEOUS
  Administered 2017-08-01 (×2): 4 [IU] via SUBCUTANEOUS
  Administered 2017-08-02: 3 [IU] via SUBCUTANEOUS
  Administered 2017-08-02: 4 [IU] via SUBCUTANEOUS
  Administered 2017-08-02: 7 [IU] via SUBCUTANEOUS
  Administered 2017-08-02 (×2): 4 [IU] via SUBCUTANEOUS
  Administered 2017-08-03 (×2): 7 [IU] via SUBCUTANEOUS
  Administered 2017-08-03 (×2): 4 [IU] via SUBCUTANEOUS
  Administered 2017-08-03: 7 [IU] via SUBCUTANEOUS
  Administered 2017-08-03: 4 [IU] via SUBCUTANEOUS
  Administered 2017-08-04: 6 [IU] via SUBCUTANEOUS
  Administered 2017-08-04: 4 [IU] via SUBCUTANEOUS
  Administered 2017-08-04: 7 [IU] via SUBCUTANEOUS
  Administered 2017-08-04 (×2): 4 [IU] via SUBCUTANEOUS
  Administered 2017-08-05: 3 [IU] via SUBCUTANEOUS

## 2017-07-24 MED ORDER — ACETYLCYSTEINE 20 % IN SOLN
4.0000 mL | Freq: Three times a day (TID) | RESPIRATORY_TRACT | Status: AC
  Administered 2017-07-24: 4 mL via RESPIRATORY_TRACT
  Filled 2017-07-24: qty 4

## 2017-07-24 MED ORDER — CHLORHEXIDINE GLUCONATE 0.12% ORAL RINSE (MEDLINE KIT)
15.0000 mL | Freq: Two times a day (BID) | OROMUCOSAL | Status: DC
  Administered 2017-07-24 – 2017-07-31 (×14): 15 mL via OROMUCOSAL

## 2017-07-24 MED ORDER — FENTANYL CITRATE (PF) 100 MCG/2ML IJ SOLN: 50.0000 ug | Freq: Once | INTRAMUSCULAR | Status: DC

## 2017-07-24 MED ORDER — SODIUM CHLORIDE 0.9 % IV SOLN: Freq: Once | INTRAVENOUS | Status: DC

## 2017-07-24 MED ORDER — VANCOMYCIN HCL IN DEXTROSE 1-5 GM/200ML-% IV SOLN
1000.0000 mg | INTRAVENOUS | Status: DC
  Administered 2017-07-25 – 2017-07-26 (×2): 1000 mg via INTRAVENOUS
  Filled 2017-07-24 (×4): qty 200

## 2017-07-24 MED ORDER — FENTANYL CITRATE (PF) 100 MCG/2ML IJ SOLN
100.0000 ug | Freq: Once | INTRAMUSCULAR | Status: AC
  Administered 2017-07-24: 100 ug via INTRAVENOUS

## 2017-07-24 MED ORDER — HYDROXYZINE HCL 50 MG/ML IM SOLN
25.0000 mg | Freq: Four times a day (QID) | INTRAMUSCULAR | Status: DC | PRN
  Administered 2017-07-24: 25 mg via INTRAMUSCULAR
  Filled 2017-07-24 (×2): qty 0.5

## 2017-07-24 MED ORDER — ANASTROZOLE 1 MG PO TABS
1.0000 mg | ORAL_TABLET | Freq: Every day | ORAL | Status: DC
  Administered 2017-07-25 – 2017-07-27 (×3): 1 mg
  Filled 2017-07-24 (×4): qty 1

## 2017-07-24 MED ORDER — VANCOMYCIN HCL 10 G IV SOLR
1750.0000 mg | Freq: Once | INTRAVENOUS | Status: AC
  Administered 2017-07-24: 1750 mg via INTRAVENOUS
  Filled 2017-07-24: qty 1750

## 2017-07-24 MED ORDER — FENOFIBRATE 54 MG PO TABS
54.0000 mg | ORAL_TABLET | Freq: Every day | ORAL | Status: DC
  Administered 2017-07-25 – 2017-07-28 (×4): 54 mg
  Filled 2017-07-24 (×4): qty 1

## 2017-07-24 MED ORDER — METHYLPREDNISOLONE SODIUM SUCC 125 MG IJ SOLR
125.0000 mg | Freq: Four times a day (QID) | INTRAMUSCULAR | Status: DC
  Administered 2017-07-24: 125 mg via INTRAVENOUS
  Filled 2017-07-24: qty 2

## 2017-07-24 MED ORDER — FENTANYL 2500MCG IN NS 250ML (10MCG/ML) PREMIX INFUSION
25.0000 ug/h | INTRAVENOUS | Status: DC
  Administered 2017-07-24: 100 ug/h via INTRAVENOUS
  Administered 2017-07-25 – 2017-07-26 (×2): 200 ug/h via INTRAVENOUS
  Administered 2017-07-26: 300 ug/h via INTRAVENOUS
  Administered 2017-07-27 (×3): 250 ug/h via INTRAVENOUS
  Administered 2017-07-28 (×4): 400 ug/h via INTRAVENOUS
  Administered 2017-07-29: 200 ug/h via INTRAVENOUS
  Administered 2017-07-29: 400 ug/h via INTRAVENOUS
  Administered 2017-07-29: 200 ug/h via INTRAVENOUS
  Administered 2017-07-30: 250 ug/h via INTRAVENOUS
  Filled 2017-07-24 (×15): qty 250

## 2017-07-24 MED ORDER — ORAL CARE MOUTH RINSE
15.0000 mL | Freq: Four times a day (QID) | OROMUCOSAL | Status: DC
  Administered 2017-07-24: 15 mL via OROMUCOSAL

## 2017-07-24 MED ORDER — NOREPINEPHRINE BITARTRATE 1 MG/ML IV SOLN
0.0000 ug/min | INTRAVENOUS | Status: DC
  Administered 2017-07-24: 18 ug/min via INTRAVENOUS
  Filled 2017-07-24 (×2): qty 16

## 2017-07-24 MED ORDER — MIDAZOLAM HCL 2 MG/2ML IJ SOLN: 2.0000 mg | Freq: Once | INTRAMUSCULAR | Status: DC

## 2017-07-24 MED ORDER — LACTATED RINGERS IV BOLUS
1000.0000 mL | Freq: Once | INTRAVENOUS | Status: AC
  Administered 2017-07-24: 1000 mL via INTRAVENOUS

## 2017-07-24 MED FILL — Medication: Qty: 1 | Status: AC

## 2017-07-24 NOTE — Progress Notes (Signed)
Patient family at bedside requesting MD to come assess patient and give update concerning plan of care for patient.Dr.Patel paged to see patient.

## 2017-07-24 NOTE — Significant Event (Signed)
Rapid Response Event Note  Overview:  Called by RN for patient with respiratory distress Time Called: [phone] Arrival Time: 0100    On my arrival to patients room, RN and daughter at bedside.  Patient is in bed with labored breathing on NRB sats 84%, RR 30's.  MD at bedside.  Daughter is upset with care and is  requesting critical care to be consulted.  Patient has no PIV and awaiting IV team to come place IV.  Interventions:  ABG obtained by RT.  7.4,36, 52, 84% on NRB.  Placed on BIPAP  Plan of Care (if not transferred):  RN to follow through orders and call if needs assistance   Event Summary:   at      at          [facility], Harlin Rain

## 2017-07-24 NOTE — Progress Notes (Addendum)
CRITICAL VALUE ALERT  Critical Value:  Top .03, Lactic Acid 7.0  Date & Time Notied:  1650 07/24/17   Provider Notified: Dr. Nelda Marseille at bedside   Orders Received/Actions taken: no new orders at this time

## 2017-07-24 NOTE — Progress Notes (Signed)
Pharmacy Antibiotic Note  [name] is a 72 y.o. female admitted on 07/23/2017 with progressive cough over the past 3 weeks. Transferred to the ICU this morning. Had a brief CPR this afternoon. Required intubation and bronch. Starting empiric abx.   Plan: -Vancomycin 1750 mg IV x1 then 1 g IV q24h -Ceftriaxone 1g/24h -Azithromycin 500 mg IV q24h -Monitor renal fx, cultures, VT as needed   Height: _0  (162.6 cm) Weight: 188 lb (85.3 kg) IBW/kg (Calculated) : 54.7  Temp (24hrs), Avg:97.9 F (36.6 C), Min:96.7 F (35.9 C), Max:98.4 F (36.9 C)  Recent Labs  Lab 07/23/17 1214 07/23/17 1916 07/24/17 0609 07/24/17 0805  WBC  --  3.2* 2.2* 2.7*  CREATININE 2.31*  --  2.43* 2.77*  LATICACIDVEN  --   --   --  3.0*      Allergies  Allergen Reactions  . Percocet [Oxycodone-Acetaminophen] Other (See Comments)    headaches  . Diazepam Other (See Comments)    REACTION: Agitation  . Haloperidol Lactate Nausea And Vomiting  . Lorazepam Nausea Only  . Morphine Sulfate Other (See Comments)    REACTION: Agitation  . Propoxyphene Hcl Nausea And Vomiting  . Tramadol Hcl Nausea And Vomiting    Antimicrobials this admission: 5/7 vancomycin > 5/8 azithromycin > 5/8 vancomycin >  Dose adjustments this admission: N/A  Microbiology results: 5/8 mrsa pcr: neg 5/8 blood cx: 5/8 pcp: 5/8 bal:  [name] 07/24/2017 2:39 PM

## 2017-07-24 NOTE — Progress Notes (Signed)
RN concerned that morning labs were incorrect due to the extreme difference from previous labs. Requested order for repeat STAT labs before starting transfusions.

## 2017-07-24 NOTE — Progress Notes (Signed)
Called to patients room by RN patient on partial NRB SPO2 is in the low 80's WOB is labored after being on bed pan.  Rapid response called upon assessment with Dr. and team ABG ordered and obtained.  Patient placed on BIPAP due to WOB being labored.  IV team at bedside getting access at this  time as well as daughter by patients side.  Patient had to be placed on 100%  FIO2 on the BIPAP as of now to keep SPO2 90% or greater at this time.  RT will continue to assess patients respiratory status and decrease FIO2 as tolerated.

## 2017-07-24 NOTE — Progress Notes (Addendum)
Repeat labs reported to Dr Eppie Gibson. Transfusion to be done after lactated ringer bolus.

## 2017-07-24 NOTE — Consult Note (Addendum)
Cardiology Consultation:   Patient ID: [name]; [id number]; [birth year]   Admit date: 07/23/2017 Date of Consult: 07/24/2017  Primary Care Provider: Bartholomew Crews, MD Primary Cardiologist:  Primary Electrophysiologist:  n/a   Patient Profile:   [name] is a 72 y.o. female with a hx of HTN who is being seen today for the evaluation of bradycardia at the request of Dr. Nelda Marseille.  History of Present Illness:   Ms. Mahn has an extensive medical history.  Patient was intubated, unable to obtain from the patient.: 72 year old female with PMH as below, which is significant for Breast Ca (on anastrozole), CAD s/p DES, diastolic CHF, who was admitted to [facility] 5/7 with complaints of orthopnea and non-productive cough that have been progressive for 3 weeks. Acutely developed pleuritic chest pain, which prompted her to present to the clinic. In clinic she was hypoxic requiring nonrebeather to keep sats in the 90s, for which reason she was admitted. She was treated with CAP antibiotics, gentle IVF, and supplemental O2. Chest radiograph demonstrated large L sided infiltrate. She was felt to be volume overloaded on exam.  Despite Non-rebreather delivered O2, she remained hypotensive and somewhat hypotensive. She wast started on BiPAP and transferred to SDU. PCCM asked to see.   SIGNIFICANT EVENTS  5/7 admit 5/8 started on BiPAP and transferred to SDU. Brief junctional bradycardia w/ brief PEA arrest f/b cardiogenic & septic shock 3 pressor dependent "  We are asked to see the patient because of the junctional bradycardia and PEA arrest.  She received IV epinephrine as part of her ACLS protocol.  Heart rate came back.  We were asked to consider placement of a transvenous pacemaker.     Past Medical History:  Diagnosis Date  . Abdominal discomfort    Chronic N/V/D. Presumptive dx Crohn's dx per elevated p ANCA. Failed Entocort and Pentasa. Sep 2003 - ileocolectomy c anastomosis  per Dr Deon Pilling 2/2 adhesions - path was hegative for Crohns. EGD, Sm bowel follow through (11/03), and an eteroclysis (10/03) were unrevealing. Cuases hypomag and hypocalcemia.  . Adnexal mass 8/03   s/p lap BSO (R ovarian fibroma) & lysis of adhesions  . Allergy    Seasonal  . Anemia    Multifactorial. Baseline HgB 10-11 ish. B12 def - 150 in 3/10. Fe Def - ferritin 35 3/10. Both are being repleted.  . Breast cancer ([place]) 03/16/13   right, 5 o'clock  . CAD (coronary artery disease) 1996   1996 - PTCA and angioplasty diagonal branch. 2000 - Rotoblator & angiopllasty of diagonal. 2006 - subendocardial AMI, DES to proximal LAD.Marland Kitchen Also had 90% stenosis in distal apical LAD. EF 55 with apical hypokinesis. Indefinite ASA and Plavix.  . CHF (congestive heart failure) ([place])   . Chronic kidney disease    Chronic renal insuff baseline Cr 1.2 - 1.4 ish.  . Chronic pain    CT 10/10 = Spinal stenosis L2 - S1.  . Diabetes mellitus    Insulin dependent  . Hx of radiation therapy 06/02/13- 07/16/13   right rbeast 4500 cGy 25 sessions, right breast boost 1600 cGy in 8 sessions  . Hyperlipidemia    Managed with both a statin and Welchol. Welchol stopped 2014 2/2 cost and started on fenofibrate   . Hypertension    2006 B renal arteries patent. 2003 MRA - no RAS. 2003 pheo W/U Dr Hassell Done reportedly negative.  . Hypoxia 07/23/2017  . Lupus ([place])   . Personal history  of radiation therapy   . RBBB   . Stroke [facility])    Incidental finding MRI 2002 L lacunar infarct  . Wears dentures    top    Past Surgical History:  Procedure Laterality Date  . ABDOMINAL HYSTERECTOMY    . BILATERAL SALPINGOOPHORECTOMY  8/03   Lap BSO (R ovarian fibroma) and adhesion lysis  . BOWEL RESECTION  2003   ileocolectomy with anastomosis 2/2 adhesions  . BREAST BIOPSY    . BREAST LUMPECTOMY Right   . BREAST LUMPECTOMY WITH NEEDLE LOCALIZATION AND AXILLARY SENTINEL LYMPH NODE BX Right 04/22/2013   Procedure: BREAST LUMPECTOMY WITH  NEEDLE LOCALIZATION AND AXILLARY SENTINEL LYMPH NODE BX;  Surgeon: Stark Klein, MD;  Location: [facility];  Service: General;  Laterality: Right;  . CARDIAC CATHETERIZATION     2 stents  . CHOLECYSTECTOMY    . HEMICOLECTOMY     R sided hemicolectomy  . HERNIA REPAIR     Ventral hernia repair  . PTCA  4/06       Inpatient Medications: Scheduled Meds: . acetylcysteine  4 mL Nebulization TID  . [START ON 07/25/2017] anastrozole  1 mg Per Tube Daily  . atorvastatin  40 mg Oral QHS  . chlorhexidine gluconate (MEDLINE KIT)  15 mL Mouth Rinse BID  . [START ON 07/25/2017] fenofibrate  54 mg Per Tube Daily  . fentaNYL (SUBLIMAZE) injection  50 mcg Intravenous Once  . insulin aspart  0-20 Units Subcutaneous Q4H  . insulin detemir  20 Units Subcutaneous QHS  . ipratropium-albuterol  3 mL Nebulization Q4H  . mouth rinse  15 mL Mouth Rinse BID  . mouth rinse  15 mL Mouth Rinse QID  . midazolam      . midazolam  2 mg Intravenous Once   Continuous Infusions: . sodium chloride    . azithromycin Stopped (07/24/17 0303)  . cefTRIAXone (ROCEPHIN)  IV Stopped (07/24/17 0200)  . epinephrine    . famotidine (PEPCID) IV Stopped (07/24/17 1542)  . fentaNYL infusion INTRAVENOUS    . norepinephrine (LEVOPHED) Adult infusion    . propofol (DIPRIVAN) infusion 20 mcg/kg/min (07/24/17 1620)  .  sodium bicarbonate  infusion 1000 mL 100 mL/hr at 07/24/17 1605  . vancomycin 1,750 mg (07/24/17 1501)  . [START ON 07/25/2017] vancomycin    . vasopressin (PITRESSIN) infusion - *FOR SHOCK* 0.03 Units/min (07/24/17 1315)   PRN Meds: acetaminophen **OR** acetaminophen, albuterol, fentaNYL  Allergies:    Allergies  Allergen Reactions  . Percocet [Oxycodone-Acetaminophen] Other (See Comments)    headaches  . Diazepam Other (See Comments)    REACTION: Agitation  . Haloperidol Lactate Nausea And Vomiting  . Lorazepam Nausea Only  . Morphine Sulfate Other (See Comments)    REACTION: Agitation    . Propoxyphene Hcl Nausea And Vomiting  . Tramadol Hcl Nausea And Vomiting    Social History:   Social History   Socioeconomic History  . Marital status: Legally Separated    Spouse name: Not on file  . Number of children: 4  . Years of education: Not on file  . Highest education level: Not on file  Occupational History    Employer: [employer]  Social Needs  . Financial resource strain: Not on file  . Food insecurity:    Worry: Not on file    Inability: Not on file  . Transportation needs:    Medical: Not on file    Non-medical: Not on file  Tobacco Use  .  Smoking status: Never Smoker  . Smokeless tobacco: Never Used  Substance and Sexual Activity  . Alcohol use: No    Alcohol/week: 0.0 oz  . Drug use: No  . Sexual activity: Not on file  Lifestyle  . Physical activity:    Days per week: Not on file    Minutes per session: Not on file  . Stress: Not on file  Relationships  . Social connections:    Talks on phone: Not on file    Gets together: Not on file    Attends religious service: Not on file    Active member of club or organization: Not on file    Attends meetings of clubs or organizations: Not on file    Relationship status: Not on file  . Intimate partner violence:    Fear of current or ex partner: Not on file    Emotionally abused: Not on file    Physically abused: Not on file    Forced sexual activity: Not on file  Other Topics Concern  . Not on file  Social History Narrative  . Not on file    Family History:    Family History  Problem Relation Age of Onset  . Pancreatic cancer Brother 32  . Cancer Mother   . Breast cancer Mother 38  . Lung cancer Maternal Aunt   . Breast cancer Maternal Aunt   . Prostate cancer Maternal Uncle   . Heart attack Maternal Grandmother   . Breast cancer Maternal Grandmother   . Kidney failure Maternal Aunt   . Hypertension Daughter      ROS:  Please see the history of present illness.  Unable to  obtain as the patient is intubated All other ROS reviewed and negative.     Physical Exam/Data:   Vitals:   07/24/17 1353 07/24/17 1540 07/24/17 1544 07/24/17 1609  BP:    (!) 124/45  Pulse:    86  Resp:    (!) 35  Temp:  98.4 F (36.9 C)    TempSrc:  Oral    SpO2: (!) 78%  (!) 84% (!) 77%  Weight:      Height:        Intake/Output Summary (Last 24 hours) at 07/24/2017 1649 Last data filed at 07/24/2017 1000 Gross per 24 hour  Intake 670 ml  Output 460 ml  Net 210 ml   Filed Weights   07/23/17 1749 07/24/17 0639  Weight: 188 lb 3.2 oz (85.4 kg) 188 lb (85.3 kg)   Body mass index is 32.27 kg/m.  General:  Well nourished, well developed,  intubated, sedated HEENT: normal Lymph: no adenopathy Neck: no JVD Endocrine:  No thryomegaly Vascular: No carotid bruits; FA pulses 2+ bilaterally without bruits  Cardiac:  normal S1, S2; RRR; no murmur  Lungs:  clear to auscultation bilaterally, no wheezing, rhonchi or rales  Abd: soft, nontender, no hepatomegaly , obese Ext: no edema Musculoskeletal:  No deformities Skin: warm and dry  Neuro: Intubated/sedated Psych: Intubated/sedated  EKG:  The EKG was personally reviewed and demonstrates: Normal sinus rhythm, right bundle branch block Telemetry:  Telemetry was personally reviewed and demonstrates: Back in normal sinus rhythm  Relevant CV Studies:   Laboratory Data:  Chemistry Recent Labs  Lab 07/24/17 0609 07/24/17 0805 07/24/17 1444  NA 138 141 141  K 6.7* 4.0 4.1  CL 109 107 106  CO2 20* 22 18*  GLUCOSE 96 99 98  BUN 57* 62* 66*  CREATININE 2.43* 2.77*  3.08*  CALCIUM 7.4* 8.2* 8.1*  GFRNONAA 19* 16* 14*  GFRAA 22* 19* 17*  ANIONGAP 9 12 17*    Recent Labs  Lab 07/24/17 1444  PROT 6.4*  ALBUMIN 2.3*  AST 145*  ALT 47  ALKPHOS 69  BILITOT 0.9   Hematology Recent Labs  Lab 07/23/17 1916 07/24/17 0609 07/24/17 0805  WBC 3.2* 2.2* 2.7*  RBC 3.49* 2.69* 3.01*  HGB 8.7* 6.6* 7.3*  HCT 27.4* 21.2*  23.7*  MCV 78.5 78.8 78.7  MCH 24.9* 24.5* 24.3*  MCHC 31.8 31.1 30.8  RDW 15.5 15.8* 15.8*  PLT 319 270 295   Cardiac Enzymes Recent Labs  Lab 07/23/17 1214 07/23/17 1820 07/23/17 2249 07/24/17 0609 07/24/17 1444  TROPONINI <0.03 <0.03 <0.03 <0.03 0.03*   No results for input(s): TROPIPOC in the last 168 hours.  BNP Recent Labs  Lab 07/23/17 1214  BNP 176.4*    DDimer No results for input(s): DDIMER in the last 168 hours.  Radiology/Studies:  Dg Chest 2 View  Result Date: 07/24/2017 CLINICAL DATA:  Pneumonia and shortness of breath EXAM: CHEST - 2 VIEW COMPARISON:  07/23/2017 FINDINGS: Cardiac shadow is stable but obscured by significant left-sided pulmonary infiltrate which is stable in appearance from the prior exam. The overall inspiratory effort is poor. No focal infiltrate is noted on the right. No sizable effusion is seen. IMPRESSION: Diffuse stable left-sided pneumonia. Electronically Signed   By: Inez Catalina M.D.   On: 07/24/2017 07:34   Dg Chest Port 1 View  Result Date: 07/24/2017 CLINICAL DATA:  Chest pain EXAM: PORTABLE CHEST 1 VIEW COMPARISON:  07/24/2017 FINDINGS: Endotracheal tube nasogastric catheter and left subclavian central line are again seen and stable. There is again significant opacification of the left hemithorax although some improved aeration in the apex and left mid lung laterally is noted. Right lung shows improved aeration when compared with the previous exam as well with only minimal central infiltrate identified. No bony abnormality is seen. IMPRESSION: Improving aeration bilaterally although significant left-sided infiltrate remains. Electronically Signed   By: Inez Catalina M.D.   On: 07/24/2017 15:26   Dg Chest Port 1 View  Result Date: 07/24/2017 CLINICAL DATA:  72 y/o F; central line placement and ET tube placement. EXAM: PORTABLE CHEST 1 VIEW COMPARISON:  07/23/2017 chest radiograph FINDINGS: Endotracheal tube tip 1.2 cm above the carina.  Enteric tube tip below the field of view in the abdomen. Increased complete opacification of the left hemithorax patchy opacification in the right mid lung zone. Cardiac silhouette is largely obscured by left hemithorax opacification. Right axillary surgical clips. Bones are unremarkable. IMPRESSION: 1. Endotracheal tube tip 1.2 cm above carina. Enteric tube tip below field of view and abdomen. 2. Increased now complete opacification of left hemithorax and new mild right mid lung zone opacity. Electronically Signed   By: Kristine Garbe M.D.   On: 07/24/2017 13:37   Dg Chest Port 1 View  Result Date: 07/23/2017 CLINICAL DATA:  Shortness of breath. EXAM: PORTABLE CHEST 1 VIEW COMPARISON:  Radiographs of January 03, 2010. FINDINGS: Stable cardiomediastinal silhouette. No pneumothorax is noted. Right lung is clear. Large left lung airspace opacity is noted consistent with pneumonia. Some degree of pleural effusion cannot be excluded. Bony thorax is unremarkable. IMPRESSION: Large left-sided pneumonia. Followup PA and lateral chest X-ray is recommended in 3-4 weeks following trial of antibiotic therapy to ensure resolution and exclude underlying malignancy. Electronically Signed   By: Marijo Conception, M.D.  On: 07/23/2017 19:25    Assessment and Plan:   1. PEA arrest: Unclear etiology.  We discussed transvenous pacemaker placement.  I personally reviewed her chest x-ray and it reveals whiteout of the left lung.  Question whether there could have been some mucus plugging which cause of bradycardia.  Discussed with Dr. Nelda Marseille and he will perform bronchoscopy to further evaluate.  If bradycardia returns, will reconsider transvenous pacer. 2. Acute respiratory failure: vent per CCM.  Bronchoscopy pending.  At the bedside, I performed an ultrasound with a hand-held machine shortly after her cardiac arrest.  Left ventricular function appeared normal.  There did not appear to be any aortic stenosis.   Mitral valve appeared to open normally.  There is no evidence of pericardial effusion.  Critical care time 40 minutes   For questions or updates, please contact [place] Please consult [email or web address] for contact info under Cardiology/STEMI.   SignedLarae Grooms, MD  07/24/2017 4:49 PM

## 2017-07-24 NOTE — Progress Notes (Signed)
ABG results were giving to the MD and we placed her on Bipap. IV team came up during this time and were able to get a 22 Gauge in her left hand and her IV antibiotics were started at this time, will continue to monitor. Orders were written to place a foley catheter at this time and pt was given the option to have a purewich placed but refused, will have the charge RN assist me in placing the foley as per order, will continue to monitor.

## 2017-07-24 NOTE — Progress Notes (Signed)
Subjective:  [name] was seen lying in bed this morning with multiple family members at bedside. Patient reports she continues to have difficulty breathing and has had the same sharp chest pain. Endorses abdominal soreness from coughing. Daughter expressed frustration at the delay in her mother's care. IV team has had difficulty obtaining IV. Midline was attempted but was not successful. She currently has a 22G PIV in left hand. This delayed her ability to receive fluids and IV antibiotics. IV antibiotics currently infusing. Daughter also expressed frustration that patient did not get CT chest overnight and concern that she may require higher level of care. She has declined BiPAP due to anxiety, currently satting mid 80s-low 90s on NRB. Discussed with patient and family member that we share the same concerns regarding the patient's current infection. We have re-contacted PCCM to ask if they will accept her and will re-attempt midline in the case that she is not transferred to the ICU.   Re-assessed due to respiratory distress. When re-evaluated she was in NAD on BiPAP with sats in high 80s-low 90s. Discussed plan to continue awaiting PCCM rounding for possible transfer to ICU.  Discussed with nurse regarding 6AM lab draw. It appears that IV potassium and fluids were running at the time the labs were drawn. Thus, Hb and K may not be accurate. Ordered stat labs this AM to confirm.  Objective:  Vital signs in last 24 hours: Vitals:   07/24/17 0250 07/24/17 0320 07/24/17 0420 07/24/17 0510  BP: (!) 97/52 (!) 99/54 (!) 99/47 (!) 93/51  Pulse: 80 78 78 78  Resp:  (!) 36 (!) 27 (!) 33  Temp:      TempSrc:      SpO2: (!) 80% 90% 93% 92%  Weight:      Height:       GEN: Lying in bed, patient in mild distress, on NRB CV: NR & RR, no m/r/g PULM: Diffuse rhonchi and crackles in bilateral lung fields, on NRB, able to speak to [place], not using accessory muscles ABD: Soreness to palpation, ND,  +BS MSK: Nonpitting bilateral LE edema  Assessment/Plan:  Active Problems:   SOB (shortness of breath)   Chest pain   Pneumonia of left lung due to infectious organism  [name] is a 72yo female with PMH of HTN, DM, CKD, breast cancer (on anastrozole), multivessel CAD s/p DES to LAD in 2006 (on DAPT), and HFpEF (TTE 10/2016: LVEF 60-65%, grade 1 DD) who presents from clinic with 3 weeks of nonproductive cough and acute onset of left-sided pleuritic chest pain. She was noted to be hypoxic, tachypneic, and hypotensive on admission. CXR showed large left-sided infiltrate, consistent with sepsis secondary to multilobar pneumonia.  Sepsis Acute hypoxic respiratory failure 2/2 Left-sided multilobar pneumonia PCCM consulted overnight due to worsening respiratory status. Patient has become more hypoxic overnight, requiring NRB and BiPAP. Sats currently 85-93%. She is hypotensive to 78/56 and tachypneic to 35. She has declined BiPAP due to anxiety, will order IM hydroxyzine, attempt to use BiPAP, and discuss case with PCCM on whether she may require transfer to the ICU. Patient also noted to be leukopenic and anemic (WBC 2.7, Hb 6.6), raising concern for possible pulmonary hemorrhage as a contributory etiology. On further discussion with nurse, initial lab draw at 6am this morning was inaccurate. Hb is 7.3. She is currently on azithromycin and ceftriaxone. - Currently in stepdown - PCCM consulted; will discuss further with them regarding whether she is appropriate for higher level  of care - IM hydroxyzine 43m x1 - BiPAP PRN - Repeat stat CBC and BMP to confirm anemia and hyperkalemia - F/u lactic acid - F/u CT chest - F/u urine strep pneumo, urine legionella antigen - F/u BCx - Low threshold for broadening antibiotics - CBC, BMP in AM - Insert midline IV  Acute anemia (hx of iron deficiency anemia) Leukopenia Hb 6.6 this AM, WBC 2.7. Diff pending. Repeating CBC to ensure accuracy given RN's  concern that this may have been drawn while fluids and K were running. - Repeat stat CBC  HFpEF (TTE 10/2016: LVEF 60-65%, grade 1 DD) Home regimen includes Lasix 823mdaily, with which she reports compliance. Weight 188lbs, was 189 in July 2018. Acute respiratory failure most likely related to PNA rather than pulmonary edema. - Daily weights - I/Os - Holding home lasix in setting of sepsis  CAD s/p DES to LAD in 2006 Troponins negative thus far. EKG without acute ischemic changes. Pleuritic chest pain likely secondary to PNA. EKG this AM with RBBB, unchanged from yesterday. - Continue home carvedilol 6.2575mID and atorvastatin 46m31mS - Trend troponins  Hx of HTN Home regimen includes carvedilol 6.25mg21m and losartan 50mg 70my. Hypotensive. - Continue home carvedilol 6.25mg B82m Hold home losartan 50mg da91min setting of sepsis  DM Home regimen includes levemir 36 units daily, repaglinide 2mg TID,68md linagliptin 5mg daily60mG 85-125. - Levemir 20u QHS - SSI-M TID WC & HS - CBG monitoring  AKI on CKD Baseline Cr ~1.7-2. Cr 2.31->2.43. Likely related to sepsis. - BMP in AM  Stage IA right invasive ductal breast carcinoma, ER/PR+, HER2- s/p R breast lumpectomy and XRT in 2014 Declined adjuvant chemotherapy. S/p adjuvant breast radiation in 2015. Currently on anastrozole. - Continue anastrozole 1mg daily 77mspo: Anticipated discharge pending clinical improvement  [name], Katie Ewing19, 6:37 AM Pager: [phone] After rounds this morning, patient required intubation and was thus transferred to the ICU. PCCM to manage her care while in the ICU. We are happy to take over her care once she has been stabilized and out of the ICU.

## 2017-07-24 NOTE — Progress Notes (Signed)
RT found pt on these BIPAP settings per RN placed bipap mask on.

## 2017-07-24 NOTE — Progress Notes (Signed)
Upon initial assessment, patient increased work of breathing on BiPAP. Sats stable. MD aware. RN will continue to monitor.

## 2017-07-24 NOTE — Progress Notes (Signed)
eLink Physician-Brief Progress Note Patient Name: [name] DOB: 02/26/1946 MRN: [id number]   Date of Service  07/24/2017  HPI/Events of Note  Metabolic acidosis on Bicarbonate infusion since 17:00 Hrs.  eICU Interventions  Check ABG at midnight        Abu Heavin U Skie Vitrano 07/24/2017, 10:14 PM

## 2017-07-24 NOTE — Progress Notes (Signed)
Late Entry: RT transported pt on NIV 18/10, 100% with RN - no complications. RT report given to Johny Blamer, RRT. RT will continue to follow.

## 2017-07-24 NOTE — Procedures (Signed)
Bronchoscopy Procedure Note KALIANN CORYELL [id number] 01/01/1946  Procedure: Bronchoscopy Indications: Diagnostic evaluation of the airways, Obtain specimens for culture and/or other diagnostic studies and Remove secretions  Procedure Details Consent: Risks of procedure as well as the alternatives and risks of each were explained to the (patient/caregiver).  Consent for procedure obtained. Time Out: Verified patient identification, verified procedure, site/side was marked, verified correct patient position, special equipment/implants available, medications/allergies/relevent history reviewed, required imaging and test results available.  Performed  In preparation for procedure, patient was given 100% FiO2 and bronchoscope lubricated. Sedation: Benzodiazepines, Muscle relaxants, Etomidate and Fentanyl  Airway entered and the following bronchi were examined: RUL, RML, RLL, LUL, LLL and Bronchi.   LLL with serial eloquotes with progressive bloodiness.  Suggesting acute alveolar hemorrhage. Bronchoscope removed.    Evaluation Hemodynamic Status: BP stable throughout; O2 sats: stable throughout Patient's Current Condition: stable Specimens:  Sent serosanguinous fluid Complications: No apparent complications Patient did tolerate procedure well.   Jennet Maduro 07/24/2017

## 2017-07-24 NOTE — Progress Notes (Signed)
Patient daughter walked up to front desk and stated,'My mom needs some help ." Patient primary nurse called to go assess patient.Patient now starting to [place] in 80'S on non rebreather rapid response ,respiratory therapy and MD paged to come see patient.

## 2017-07-24 NOTE — Progress Notes (Signed)
RT increased IPAP 10, Ti 1 due to low sats. Pt tol well.

## 2017-07-24 NOTE — Progress Notes (Signed)
Pt ready  to try Bipap after refusing several times. Once RN began transition, pt became extremely anxious. Pt pulled off her O2 mask and refused to put it back on. As RN encouraged pt to replace O2, pt's SATS went into the 60's. Pt asked for bed pan and RN continued to encourage pt to put Bipap mask on. RN was able to console pt and place pt on Bipap. Pt was placed on bedpan after being placed on Bipap. Pt SATS were slow to recover. RN notified RT, Rapid Response, and Dr. Eppie Gibson. Care team at bedside to assess pt.

## 2017-07-24 NOTE — Progress Notes (Signed)
IV team called about placing the patient's midline but she has IV Potassium running now and I was just able to calm her somewhat on NRB with resps in the lower 30's, will let the AM nurse now and will attempt to place in AM.

## 2017-07-24 NOTE — Procedures (Signed)
Central Venous Catheter Insertion Procedure Note [name] [id number] [birth year]  Procedure: Insertion of Central Venous Catheter Indications: Assessment of intravascular volume, Drug and/or fluid administration and Frequent blood sampling  Procedure Details Consent: Unable to obtain consent because of emergent medical necessity. Time Out: Verified patient identification, verified procedure, site/side was marked, verified correct patient position, special equipment/implants available, medications/allergies/relevent history reviewed, required imaging and test results available.  Performed  Maximum sterile technique was used including antiseptics, cap, gloves, gown, hand hygiene, mask and sheet. Skin prep: Chlorhexidine; local anesthetic administered A antimicrobial bonded/coated triple lumen catheter was placed in the left subclavian vein using the Seldinger technique.  Evaluation Blood flow good Complications: No apparent complications Patient did tolerate procedure well. Chest X-ray ordered to verify placement.  CXR: pending.  Sheryl Towell 07/24/2017, 1:07 PM

## 2017-07-24 NOTE — Procedures (Signed)
Intubation Procedure Note IKRAM RIEBE [id number] [birth year]  Procedure: Intubation Indications: Respiratory insufficiency  Procedure Details Consent: Risks of procedure as well as the alternatives and risks of each were explained to the (patient/caregiver).  Consent for procedure obtained. Time Out: Verified patient identification, verified procedure, site/side was marked, verified correct patient position, special equipment/implants available, medications/allergies/relevent history reviewed, required imaging and test results available.  Performed  Maximum sterile technique was used including gloves, hand hygiene and mask.  MAC    Evaluation Hemodynamic Status: BP stable throughout; O2 sats: stable throughout Patient's Current Condition: stable Complications: No apparent complications Patient did tolerate procedure well. Chest X-ray ordered to verify placement.  CXR: pending.   Jennet Maduro 07/24/2017

## 2017-07-24 NOTE — Progress Notes (Signed)
We were called to bedside due to increased work of breathing and worsening hypoxia. Rapid response were called and present on arrival. Daughter is at bedside. Patient is on non-rebreather satting 85% with labored breathing. Blood pressure is up to 150/60s. Coarse rhonchi are heard over the left anterior lung field, no rhonchi over the right anterior chest. IV access was lost after returning from radiology and she has still not received antibiotics and received only 30 minutes of IV fluids.   2VCXR again shows a large left sided consolidation without obvious pulmonary edema. ABG results show pH 7.41/pCO2 36.7/ pO2 52.9. Suspect that her hypoxia is secondary to intrapulmonary shunting from her large consolidation and less likely from volume overload.   Will transfer patient to stepdown care and initiate BiPAP. IV team at bedside to obtain access to start antibiotics ASAP. I discussed the case with Dr. Emmit Alexanders with PCCM who agrees with current plan of care and will notify ground team for consultation.  Zada Finders, MD Internal Medicine PGY-3

## 2017-07-24 NOTE — Progress Notes (Signed)
Critical care intensive came to see her and assessed the patient and spoke with her and the daughter about POC and her options, she also addressed a code status with the patient who wants everything done but doesn't want to be on a ventilator for long term, order placed for a CT scan and also for a foley catheter, will place foley and await CT to call regarding a time.

## 2017-07-24 NOTE — Progress Notes (Addendum)
Called by primary MD, patient is decompensating from a respiratory standpoint and hemodynamics.  Patient arrested post intubation with junctional brady.  Patient was stabilized, see code sheet, and family updated.  Full code.    BAL performed and serial eloquotes with progressively bloodier fluid removed confirming diagnosis of diffuse alveolar hemorrhage.  Again discussion with family, informed of diagnosis and the concern that steroids in an infection is problematic but again informed that full course treatment is the patient's wishes.  Informed of the risk, will start high dose steroids, BAL for cultures.  Full code status confirmed.    Spoke at length with the patient's daughter who is an NP, informed that using high dose steroids in a patient that is infected and immune compromised is very dangerous but without it we are unable to oxygenate the patient.  We discussed alternatives and she elected steroids with my recommendations.  Increase PEEP.  The patient is critically ill with multiple organ systems failure and requires high complexity decision making for assessment and support, frequent evaluation and titration of therapies, application of advanced monitoring technologies and extensive interpretation of multiple databases.   Critical Care Time devoted to patient care services described in this note is  120  Minutes. This time reflects time of care of this signee Dr Jennet Maduro. This critical care time does not reflect procedure time, or teaching time or supervisory time of PA/NP/Med student/Med Resident etc but could involve care discussion time.  Rush Farmer, M.D. [facility] Pulmonary/Critical Care Medicine. Pager: [phone]. After hours pager: [phone].

## 2017-07-24 NOTE — Progress Notes (Signed)
Family started asking about why patient has not gone for her chest CT, so I called CT to see what was going on and we were able to determine that a CT was not ordered. I was in the room when the MD was going over a POC with the family which entailed a CT of the chest without contrast and so I looked at her note and found it in her plan of care and placed a verbal order. Pt is to have an EKG and will plan to take her after, family in the room at this time and updated on what is going on.

## 2017-07-24 NOTE — Progress Notes (Signed)
MD returned the call and said that he would put the order in for Atarax, will give when order placed and checked by pharmacy, will be giving all pertinent to oncoming RN and made family aware.

## 2017-07-24 NOTE — Progress Notes (Addendum)
Name: [name] MRN: [id number] DOB: 1945-04-23    ADMISSION DATE:  07/23/2017 CONSULTATION DATE:  07/24/2017  REFERRING MD :  Dr. Ivor Costa  CHIEF COMPLAINT:  SOB  HISTORY OF PRESENT ILLNESS:  72 year old female with PMH as below, which is significant for Breast Ca (on anastrozole), CAD s/p DES, diastolic CHF, who was admitted to [facility] 5/7 with complaints of orthopnea and non-productive cough that have been progressive for 3 weeks. Acutely developed pleuritic chest pain, which prompted her to present to the clinic. In clinic she was hypoxic requiring nonrebeather to keep sats in the 90s, for which reason she was admitted. She was treated with CAP antibiotics, gentle IVF, and supplemental O2. Chest radiograph demonstrated large L sided infiltrate. She was felt to be volume overloaded on exam.  Despite Non-rebreather delivered O2, she remained hypotensive and somewhat hypotensive. She wast started on BiPAP and transferred to SDU. PCCM asked to see.   SIGNIFICANT EVENTS  5/7 admit 5/8 started on BiPAP and transferred to SDU. Brief junctional bradycardia w/ brief PEA arrest f/b cardiogenic & septic shock 3 pressor dependent   STUDIES:  CTA chest 5/8 >>>  abx  azith 6/7>>> Rocephin 5/8>> vanc 5/8>>>  Cultures BAL 5/8>>> BCX2 5/8>>> u strep 5/7: neg u legionella 5/7>>>  SUBJECTIVE:  Failed NIPPV Brief PEA arrest after intubation w/ junctional brady rhythm. ? Plug on left resulting in vagal response?   Sedated on vent VITAL SIGNS: Temp:  [96.7 F (35.9 C)-98.4 F (36.9 C)] 96.7 F (35.9 C) (05/08 1049) Pulse Rate:  [70-81] 74 (05/08 1010) Resp:  [14-38] 38 (05/08 1003) BP: (68-152)/(41-67) 77/47 (05/08 1010) SpO2:  [80 %-100 %] 94 % (05/08 1010) FiO2 (%):  [80 %-100 %] 80 % (05/08 1010) Weight:  [188 lb (85.3 kg)-188 lb 3.2 oz (85.4 kg)] 188 lb (85.3 kg) (05/08 0639)  PHYSICAL EXAMINATION:  General 72 year old aaf sedated now on vent HENT NCAT now w/ 7.5 ETT. +  JVD prior to intubation  Pulm scattered rhonchi decreased on left  Card bradycardia at times no MRG Ext now warm and dry + pulses trace LE edema abd soft not tender + bowel sounds no OM  Neuro was awake and oriented w/out focal def prior to intubation   Recent Labs  Lab 07/23/17 1214 07/24/17 0609 07/24/17 0805  NA 141 138 141  K 3.4* 6.7* 4.0  CL 106 109 107  CO2 27 20* 22  BUN 59* 57* 62*  CREATININE 2.31* 2.43* 2.77*  GLUCOSE 84 96 99   Recent Labs  Lab 07/23/17 1916 07/24/17 0609 07/24/17 0805  HGB 8.7* 6.6* 7.3*  HCT 27.4* 21.2* 23.7*  WBC 3.2* 2.2* 2.7*  PLT 319 270 295   Dg Chest 2 View  Result Date: 07/24/2017 CLINICAL DATA:  Pneumonia and shortness of breath EXAM: CHEST - 2 VIEW COMPARISON:  07/23/2017 FINDINGS: Cardiac shadow is stable but obscured by significant left-sided pulmonary infiltrate which is stable in appearance from the prior exam. The overall inspiratory effort is poor. No focal infiltrate is noted on the right. No sizable effusion is seen. IMPRESSION: Diffuse stable left-sided pneumonia. Electronically Signed   By: Inez Catalina M.D.   On: 07/24/2017 07:34   Dg Chest Port 1 View  Result Date: 07/23/2017 CLINICAL DATA:  Shortness of breath. EXAM: PORTABLE CHEST 1 VIEW COMPARISON:  Radiographs of January 03, 2010. FINDINGS: Stable cardiomediastinal silhouette. No pneumothorax is noted. Right lung is clear. Large left lung  airspace opacity is noted consistent with pneumonia. Some degree of pleural effusion cannot be excluded. Bony thorax is unremarkable. IMPRESSION: Large left-sided pneumonia. Followup PA and lateral chest X-ray is recommended in 3-4 weeks following trial of antibiotic therapy to ensure resolution and exclude underlying malignancy. Electronically Signed   By: Marijo Conception, M.D.   On: 07/23/2017 19:25    ASSESSMENT / PLAN:   Acute Hypoxic respiratory failure- Left multilobar infiltrate/CAP after bronch raising suspicion for  [facility] -Portable chest x-ray reviewed: Demonstrates patchy right-sided airspace disease but densely consolidated left-sided airspace disease occupying most of the left hemithorax -Placed on noninvasive, initially apparently did well with this, however has been desaturating -Intake output +200 mL -Urine strep negative, urine Legionella pending -Calcitonin impressive at 68.11 -she is failing attempts at NIPPV Plan Intubate/ventilate Full vent support F/u bal IV 125 solumedrol  Serial cxr  Hold ac give alveolar hemorrhage  Add staph coverage Day 2 azith added rocephin   Brief PEA arrest following intubation. Bradycardic rhythm.  -less than 3 minutes Now in mix of cardiogenic and septic shock. ? Acute NSTEMI?  H/o CAD and diastolic HF  Plan Cards consulted Will place temp placer  Titrate levo,epi and vasopressin  Transduce CVP MAP goal > 65 Cont lipitor, fenofibrate Hold asa and plavix  Chronic renal failure w/ acute on chronic renal failure  Plan MAP goal > 65 Renal dose meds Strict I&O F/u chem   At risk for delirium Plan PAD protocol  Supportive care  At risk for fluid and electrolyte imbalance Plan Trend chemistry  Replace lytes as needed   Anemia of chronic disease Plan scd Trend CBC Transfuse per protocol for hgb < 7  DM Glycemic well-controlled Plan Continue sliding-scale insulin  Stage IA right invasive ductal breast carcinoma, ER/PR+, HER2- s/p R breast lumpectomy and XRT in 2014. Now on daily anastrozole 55m  Plan Continuing anastrozole   DVT prophylaxis:LMWH-->PAS SUP: PPI  Diet: tubefeed  Activity: BR Disposition : ICU    My ctt 45 min  PErick ColaceACNP-BC LCave CityPager # [phone] # [phone] no answer

## 2017-07-24 NOTE — Procedures (Signed)
OGT Placement by MD  Placed under direct laryngoscopy, visualized and verified by auscultation.  Rush Farmer, M.D. [facility] Pulmonary/Critical Care Medicine. Pager: [phone]. After hours pager: [phone].

## 2017-07-24 NOTE — Procedures (Signed)
CPR Procedure Code  Junctional brady cardiac arrest with mixed shock.  CPR started with epi and atropine, please see code sheet for details.  ROSC with hypotension noted.  Rush Farmer, M.D. [facility] Pulmonary/Critical Care Medicine. Pager: [phone]. After hours pager: [phone].

## 2017-07-24 NOTE — Progress Notes (Signed)
RT called to pts bedside due to low sats. Mask secure. Small breakdown noted on pts nose. Protective barrier on pts nose. Rapid RN at the bedside. MD at the bedside

## 2017-07-24 NOTE — Progress Notes (Signed)
Patient wanted the Bipap off to be able to put something on her nose and to have mouthcare done and when I tried to put the Bipap mask back on the patient became very irritated and refused to have it placed back on, called for assistance because the patient started to become more irritated and her sats were dropping into the 70's, called MD for order for IV medication to help calm her down so that she could at least put the NRB mask back on and when he called back I explained what happened, will continue to monitor and try to put back on at a later time. RT was also paged and came up to explain to the patient and assist in placing her back on the Bipap but the patient refused and she is alert and understands but said she's tired, will continue to monitor.

## 2017-07-24 NOTE — Progress Notes (Signed)
Family called me on my phone to let me know that their mother would go back on Bipap if she could have her Atarax. I checked and the order was D/C'd, pharmacy called to see if we could give it IV/IM instead so that she could be placed back on the Bipap, MD paged, awaiting call back, no other changes noted.

## 2017-07-24 NOTE — Procedures (Signed)
Arterial Catheter Insertion Procedure Note [name] [id number] [birth year]  Procedure: Insertion of Arterial Catheter  Indications: Blood pressure monitoring and Frequent blood sampling  Procedure Details Consent: Unable to obtain consent because of emergent medical necessity. Time Out: Verified patient identification, verified procedure, site/side was marked, verified correct patient position, special equipment/implants available, medications/allergies/relevent history reviewed, required imaging and test results available.  Performed  Maximum sterile technique was used including antiseptics, cap, gloves, gown, hand hygiene, mask and sheet. Skin prep: Chlorhexidine; local anesthetic administered 20 gauge catheter was inserted into right radial artery using the Seldinger technique. ULTRASOUND GUIDANCE USED: NO Evaluation Blood flow good; BP tracing good. Complications: No apparent complications.   Jennet Maduro 07/24/2017

## 2017-07-24 NOTE — Consult Note (Signed)
Name: [name] MRN: [id number] DOB: 01-30-1946    ADMISSION DATE:  07/23/2017 CONSULTATION DATE:  07/24/2017  REFERRING MD :  Dr. Ivor Costa  CHIEF COMPLAINT:  SOB  HISTORY OF PRESENT ILLNESS:  72 year old female with PMH as below, which is significant for Breast Ca (on anastrozole), CAD s/p DES, diastolic CHF, who was admitted to [facility] 5/7 with complaints of orthopnea and non-productive cough that have been progressive for 3 weeks. Acutely developed pleuritic chest pain, which prompted her to present to the clinic. In clinic she was hypoxic requiring nonrebeather to keep sats in the 90s, for which reason she was admitted. She was treated with CAP antibiotics, gentle IVF, and supplemental O2. Chest radiograph demonstrated large L sided infiltrate. She was felt to be volume overloaded on exam.  Despite Non-rebreather delivered O2, she remained hypotensive and somewhat hypotensive. She wast started on BiPAP and transferred to SDU. PCCM asked to see.   SIGNIFICANT EVENTS  5/7 admit 5/8 started on BiPAP and transferred to SDU.  STUDIES:  CTA chest 5/8 >>>   PAST MEDICAL HISTORY :   has a past medical history of Abdominal discomfort, Adnexal mass (8/03), Allergy, Anemia, Breast cancer ([place]) (03/16/13), CAD (coronary artery disease) (1996), CHF (congestive heart failure) ([place]), Chronic kidney disease, Chronic pain, Diabetes mellitus, radiation therapy (06/02/13- 07/16/13), Hyperlipidemia, Hypertension, Lupus ([place]), Personal history of radiation therapy, RBBB, Stroke ([place]), and Wears dentures.  has a past surgical history that includes Mitral valve replacement (4/06); Bowel resection (2003); Cholecystectomy; Hernia repair; Bilateral salpingoophorectomy (8/03); Hemicolectomy; Cardiac catheterization; Abdominal hysterectomy; Breast lumpectomy with needle localization and axillary sentinel lymph node bx (Right, 04/22/2013); Breast lumpectomy (Right); and Breast biopsy. Prior to Admission  medications   Medication Sig Start Date End Date Taking? Authorizing Provider  acetaminophen (TYLENOL) 325 MG tablet Take 650 mg by mouth every 6 (six) hours as needed for mild pain.    [provider]  anastrozole (ARIMIDEX) 1 MG tablet Take 1 tablet (1 mg total) by mouth daily. 12/19/16   Truitt Merle, MD  aspirin EC 81 MG tablet Take 81 mg by mouth daily.    [provider]  atorvastatin (LIPITOR) 40 MG tablet Take 1 tablet (40 mg total) by mouth at bedtime. 04/18/17   Bartholomew Crews, MD  carvedilol (COREG) 6.25 MG tablet Take 6.25 mg by mouth 2 (two) times daily. 03/29/16   [provider]  clopidogrel (PLAVIX) 75 MG tablet TAKE 1 TABLET BY MOUTH ONCE A DAY 04/09/17   Bartholomew Crews, MD  cyanocobalamin (,VITAMIN B-12,) 1000 MCG/ML injection Inject 1 mL (1,000 mcg total) into the muscle every 30 (thirty) days. 09/12/16   Bartholomew Crews, MD  cyclobenzaprine (FLEXERIL) 5 MG tablet TAKE 1 TABLET BY MOUTH AT  BEDTIME AS NEEDED FOR  MUSCLE SPASM(S) 06/18/17   Bartholomew Crews, MD  desloratadine (CLARINEX) 5 MG tablet Take 5 mg by mouth at bedtime as needed (allergies). Reported on 04/11/2015 04/10/13   [provider]  fenofibrate 54 MG tablet Take 54 mg by mouth daily.  09/21/15   [provider]  ferrous fumarate (HEMOCYTE - 106 MG FE) 325 (106 Fe) MG TABS tablet Take 1 tablet (106 mg of iron total) by mouth daily. Patient not taking: Reported on 12/20/2016 03/15/16   Gordy [place]an, MD  furosemide (LASIX) 40 MG tablet Take 2 tablets (80 mg total) by mouth daily. 04/09/17   Bartholomew Crews, MD  gabapentin (NEURONTIN) 300 MG  capsule Take 2 at bedtime, one in morning as needed 12/20/16   Bartholomew Crews, MD  hydrOXYzine (VISTARIL) 50 MG capsule TAKE 1 CAPSULE BY MOUTH ONCE DAILY AT BEDTIME AS NEEDED 10/29/16   Bartholomew Crews, MD  Insulin Detemir (LEVEMIR FLEXTOUCH) 100 UNIT/ML Pen Inject 36 UNITS SUBCUTANEOUSLY at the same time each  day 06/12/16   Bartholomew Crews, MD  Insulin Pen Needle (B-D UF III MINI PEN NEEDLES) 31G X 5 MM MISC Use to inject insulin one time a day 06/12/16   Bartholomew Crews, MD  loperamide (IMODIUM) 2 MG capsule TAKE ONE CAPSULE BY MOUTH ONCE DAILY AS NEEDED FOR DIARRHEA OR LOOSE STOOLS 05/24/14   Bartholomew Crews, MD  losartan (COZAAR) 50 MG tablet Take 1 tablet (50 mg total) by mouth daily. 12/06/16   Josue Hector, MD  nitroGLYCERIN (NITROSTAT) 0.4 MG SL tablet Place 1 tablet (0.4 mg total) under the tongue every 5 (five) minutes as needed for chest pain. 12/06/16   Josue Hector, MD  ONE TOUCH ULTRA TEST test strip USE TO CHECK BLOOD SUGAR 3  TO 4 TIMES DAILY 07/09/16   Bartholomew Crews, MD  potassium chloride (K-DUR) 10 MEQ tablet Take 2 tablets (20 mEq total) by mouth daily. 07/21/15   Bartholomew Crews, MD  repaglinide (PRANDIN) 2 MG tablet TAKE 1 TABLET BY MOUTH 3  TIMES A DAY BEFORE MEALS 04/09/17   Bartholomew Crews, MD  TRADJENTA 5 MG TABS tablet TAKE 1 TABLET BY MOUTH  DAILY 06/07/17   Bartholomew Crews, MD  ULORIC 40 MG tablet Take 1 tablet by mouth daily. 06/20/16   [provider]   Allergies  Allergen Reactions  . Percocet [Oxycodone-Acetaminophen] Other (See Comments)    headaches  . Diazepam Other (See Comments)    REACTION: Agitation  . Haloperidol Lactate Nausea And Vomiting  . Lorazepam Nausea Only  . Morphine Sulfate Other (See Comments)    REACTION: Agitation  . Propoxyphene Hcl Nausea And Vomiting  . Tramadol Hcl Nausea And Vomiting    FAMILY HISTORY:  family history includes Breast cancer in her maternal aunt and maternal grandmother; Breast cancer (age of onset: 13) in her mother; Cancer in her mother; Heart attack in her maternal grandmother; Hypertension in her daughter; Kidney failure in her maternal aunt; Lung cancer in her maternal aunt; Pancreatic cancer (age of onset: 57) in her brother; Prostate cancer in her maternal uncle. SOCIAL  HISTORY:  reports that she has never smoked. She has never used smokeless tobacco. She reports that she does not drink alcohol or use drugs.  REVIEW OF SYSTEMS:  Pertinents are bolded Constitutional: Negative for fever, chills, weight loss, malaise/fatigue and diaphoresis.  HENT: Negative for hearing loss, ear pain, nosebleeds, congestion, sore throat, neck pain, tinnitus and ear discharge.   Eyes: Negative for blurred vision, double vision, photophobia, pain, discharge and redness.  Respiratory: positive for - hemoptysis, sputum changes, stridor and wheezing negative for - cough, orthopnea or shortness of breath Cardiovascular: Negative for chest pain, palpitations, orthopnea, claudication, leg swelling and PND.  Gastrointestinal: Negative for heartburn, nausea, vomiting, abdominal pain, diarrhea, constipation, blood in stool and melena.  Genitourinary: Negative for dysuria, urgency, frequency, hematuria and flank pain.  Musculoskeletal: Negative for myalgias, back pain, joint pain and falls.  Skin: Negative for itching and rash.  Neurological: Negative for dizziness, tingling, tremors, sensory change, speech change, focal weakness, seizures, loss of consciousness, weakness and headaches.  Endo/Heme/Allergies: Negative for  environmental allergies and polydipsia. Does not bruise/bleed easily.  SUBJECTIVE:   VITAL SIGNS: Temp:  [98.1 F (36.7 C)-98.4 F (36.9 C)] 98.4 F (36.9 C) (05/07 2011) Pulse Rate:  [70-81] 78 (05/08 0100) Resp:  [14-29] 27 (05/08 0100) BP: (94-152)/(44-67) 152/67 (05/08 0100) SpO2:  [89 %-100 %] 95 % (05/08 0100) Weight:  [85.4 kg (188 lb 3.2 oz)] 85.4 kg (188 lb 3.2 oz) (05/07 1749)  PHYSICAL EXAMINATION: General:  In no acute distress Neuro:  AAOx4  No focal deficits HEENT:  NCAT, Bipap mask on with good seal Cardiovascular:  S1 and S2 appreciated Lungs:  L> R + rhonci coarse breath sounds. decr at b/l bases Abdomen:  Soft + BS, no tenderness on  exam Musculoskeletal:  +1 edema b/l lower ext Skin:  Grossly intact  Recent Labs  Lab 07/23/17 1214  NA 141  K 3.4*  CL 106  CO2 27  BUN 59*  CREATININE 2.31*  GLUCOSE 84   Recent Labs  Lab 07/23/17 1916  HGB 8.7*  HCT 27.4*  WBC 3.2*  PLT 319   Dg Chest Port 1 View  Result Date: 07/23/2017 CLINICAL DATA:  Shortness of breath. EXAM: PORTABLE CHEST 1 VIEW COMPARISON:  Radiographs of January 03, 2010. FINDINGS: Stable cardiomediastinal silhouette. No pneumothorax is noted. Right lung is clear. Large left lung airspace opacity is noted consistent with pneumonia. Some degree of pleural effusion cannot be excluded. Bony thorax is unremarkable. IMPRESSION: Large left-sided pneumonia. Followup PA and lateral chest X-ray is recommended in 3-4 weeks following trial of antibiotic therapy to ensure resolution and exclude underlying malignancy. Electronically Signed   By: Marijo Conception, M.D.   On: 07/23/2017 19:25    ASSESSMENT / PLAN:  Acute hypoxemic respiratory failure: likely secondary to CAP, however she is immunocompromised on oral chemo so will need broader spectrum coverage. Need to rule out PE with cancer history. Volume overloaded on exam, so may be a component of pulmonary edema, but this seems like less of a contributor.   Plan: Acute Hypoxic respiratory failure- Left multilobar infiltrate PNA Agree transfer to SDU Continue BiPAP Titrate FiO2 to keep sats > 90% Will broaden ABX  Follow cultures, urine strep, legionella.  Trend PCT, WBC Well's score 2.5- not tachy, no signs of DVT, no h/o PE, improved saturation with NIPPV CT chest to better examine infiltrate Creatinine noted therefore contrast held   Stage IA right invasive ductal breast carcinoma, ER/PR+, HER2- s/p R breast lumpectomy and XRT in 2014. Now on daily anastrozole 16m  - Per primary  Chronic diastolic CHF CAD hx - KVO - per primary  DM - per primary service.    Signed Dr KSeward CarolPulmonary Critical Care Locums

## 2017-07-24 NOTE — Progress Notes (Signed)
10 MEGs of Potassium in 100cc NSS started as per order for a potassium of 3.4. I started the medication and shortly after the patient started to c/o burning so the daughter came over and stopped the IV right in front of me. I turned it back on and said that she needed the potassium and would be willing to run it at a slower rate since it was burning and locked the pump so that the daughter would not be able to stop it again. Patient remains on the NRB and will not let anyone place her back on the Bipap. I explained to her that her body would get tired soon since her resps. Were in themid to upper 30's and her sats were in the upper 80's to low 90's but she still is refusing to let us place her back on the Bipap, will call the MD back and let him know, will continue to monitor.

## 2017-07-24 NOTE — Progress Notes (Signed)
Came into patient's room to assess her and start her IV antibiotics after she came back from her CXR and patient's daughter was in the room trying to take over her care. She wanted to hang her IV meds while I assessed her mother. I asked her if she was a [employer] scientist and she said Yes. I took the medication and primed it and programed it into the system to hang it and told the daughter that I would assess her after I started the medication. Daughter is very frustrated about the meds not being started right away after her IV was restarted. I was unable to get the IVF's to run and when I tried to flush the line, started having some resistance so I stopped the IV and told the patient and the daughter that I would have to page IV team and the daugter replied that she wanted the MD to come and place and EJ so that she could get her medication because this was the second attempt at an IV placement via ultrasound. I paged the MD and went back to speak with the charge RN to see if she could deescalate the situation because the daughter was upset that her mother still wasn't getting her IV antibiotics or her IVF's, will await a call back from the MD and continue to monitor.

## 2017-07-24 NOTE — Significant Event (Signed)
Rapid Response Event Note RN called for sats mid 80's on 100% Bipap Overview: Time Called: 6438 Arrival Time: 0900 Event Type: Respiratory  Initial Focused Assessment: On arrival pt lying supine in bed with eyes closed, easily to awaken with verbal stimuli, skin warm and dry, alert and oriented x3, pt states she's "tired" Dr. Eppie Gibson to bedside. Per family and RN pt unable to tolerate bipap mask, family states she gets "anxious" with the mask on and keeps taking it off. sats had dropped to 64% on RA. Placed pt back on bipap gradually increasing her SpO2 to 86-90%. Crackles noted to the bases bilateral lungs, diminished on Right side. SBP 60-70's, HR 35-40, HR 70. Richardson Landry Minor NP PCCM to bedside, requested transfer to ICU for intubation and hypotension.    Pt seen yesterday by PCP for non productive cough x3 weeks, woke approx 0200 with  chest tightness and SOB. Mrs. Blakney arrived to 6e04 at 1745 Tuesday 07/23/2017 as a direct admit by teaching service. I was called to bedside approx 1845 per daughters request. Pt's sats 88-92% on 4L [place], pt denies wearing O2 prior to being admitted to hospital. Dr. Ronalee Red at bedside on my arrival, no interventions from me. Pt placed on a NRB, sats increased to 98%. Waiting for results from labs and CXR to determine treatment plan.       Event Summary: Name of Physician Notified: Dr. Virgina Jock  at (paged PTA RRT )  Name of Consulting Physician Notified: Dr. Nelda Marseille at [phone]  Outcome: Transferred (Comment)(80m2)  Event End Time: [address] [place]

## 2017-07-24 NOTE — Progress Notes (Signed)
Daughter came back to the desk to let us know that her mom was starting to become agitated and needed her IV meds and to be seen by someone right away. Ambulance person gave me report on this patient and had been taking care of her since the beginning of the shift so she called the MD and rapid response team and came back to assist me with the patient. Pt's resps were in the mid to upper 30's and she was becoming agitated and restless and the daughter was getting more agitated. MD called back and I let hime speak to the daughter about her concerns, while RT and rapid response came up to evaluate the patient. MD was going to come to assess patient after he spoke to the daughter, will continue to monitor.

## 2017-07-24 NOTE — Progress Notes (Signed)
PT Cancellation Note  Patient Details Name: [name] MRN: [id number] DOB: [birth year]   Cancelled Treatment:    Reason Eval/Treat Not Completed: Medical issues which prohibited therapy. Pt having respiratory issues and transferred to ICU.    [place] 07/24/2017, 10:46 AM [place]

## 2017-07-24 NOTE — Progress Notes (Addendum)
Internal Medicine Clinic Attending  I saw and evaluated the patient.  I personally confirmed the key portions of the history and exam documented by Dr. Benjamine Mola and I reviewed pertinent patient test results.  The assessment, diagnosis, and plan were formulated together and I agree with the documentation in the resident's note. As noted by Dr Benjamine Mola, POCUS revealed B lines in 4/6 lung fields imaged (bilateral), no pleural effusion noted. IVC measured 1.8 with >50% respiratory variation.

## 2017-07-25 ENCOUNTER — Inpatient Hospital Stay (HOSPITAL_COMMUNITY): Payer: Medicare Other

## 2017-07-25 DIAGNOSIS — J9601 Acute respiratory failure with hypoxia: Secondary | ICD-10-CM

## 2017-07-25 DIAGNOSIS — I469 Cardiac arrest, cause unspecified: Secondary | ICD-10-CM

## 2017-07-25 DIAGNOSIS — R6521 Severe sepsis with septic shock: Secondary | ICD-10-CM

## 2017-07-25 DIAGNOSIS — A419 Sepsis, unspecified organism: Principal | ICD-10-CM

## 2017-07-25 LAB — POCT I-STAT 3, ART BLOOD GAS (G3+)
ACID-BASE DEFICIT: 2 mmol/L (ref 0.0–2.0)
Acid-Base Excess: 1 mmol/L (ref 0.0–2.0)
BICARBONATE: 21.6 mmol/L (ref 20.0–28.0)
Bicarbonate: 25.2 mmol/L (ref 20.0–28.0)
O2 SAT: 97 %
O2 Saturation: 97 %
PCO2 ART: 34.9 mmHg (ref 32.0–48.0)
PH ART: 7.46 — AB (ref 7.350–7.450)
PO2 ART: 82 mmHg — AB (ref 83.0–108.0)
Patient temperature: 98.6
TCO2: 23 mmol/L (ref 22–32)
TCO2: 26 mmol/L (ref 22–32)
pCO2 arterial: 30.4 mmHg — ABNORMAL LOW (ref 32.0–48.0)
pH, Arterial: 7.466 — ABNORMAL HIGH (ref 7.350–7.450)
pO2, Arterial: 81 mmHg — ABNORMAL LOW (ref 83.0–108.0)

## 2017-07-25 LAB — COMPREHENSIVE METABOLIC PANEL
ALBUMIN: 2.1 g/dL — AB (ref 3.5–5.0)
ALK PHOS: 81 U/L (ref 38–126)
ALT: 52 U/L (ref 14–54)
ANION GAP: 12 (ref 5–15)
AST: 94 U/L — ABNORMAL HIGH (ref 15–41)
BUN: 65 mg/dL — ABNORMAL HIGH (ref 6–20)
CHLORIDE: 104 mmol/L (ref 101–111)
CO2: 25 mmol/L (ref 22–32)
Calcium: 7.6 mg/dL — ABNORMAL LOW (ref 8.9–10.3)
Creatinine, Ser: 2.63 mg/dL — ABNORMAL HIGH (ref 0.44–1.00)
GFR calc Af Amer: 20 mL/min — ABNORMAL LOW (ref >60)
GFR calc non Af Amer: 17 mL/min — ABNORMAL LOW (ref >60)
GLUCOSE: 289 mg/dL — AB (ref 65–99)
POTASSIUM: 3.3 mmol/L — AB (ref 3.5–5.1)
SODIUM: 141 mmol/L (ref 135–145)
Total Bilirubin: 1.4 mg/dL — ABNORMAL HIGH (ref 0.3–1.2)
Total Protein: 5.9 g/dL — ABNORMAL LOW (ref 6.5–8.1)

## 2017-07-25 LAB — PREPARE RBC (CROSSMATCH)

## 2017-07-25 LAB — CBC
HCT: 21.6 % — ABNORMAL LOW (ref 36.0–46.0)
HEMATOCRIT: 20.5 % — AB (ref 36.0–46.0)
Hemoglobin: 6.7 g/dL — CL (ref 12.0–15.0)
Hemoglobin: 7 g/dL — ABNORMAL LOW (ref 12.0–15.0)
MCH: 25.3 pg — ABNORMAL LOW (ref 26.0–34.0)
MCH: 25.3 pg — AB (ref 26.0–34.0)
MCHC: 32.7 g/dL (ref 30.0–36.0)
MCHC: 32.4 g/dL (ref 30.0–36.0)
MCV: 77.4 fL — ABNORMAL LOW (ref 78.0–100.0)
MCV: 78 fL (ref 78.0–100.0)
PLATELETS: 285 10*3/uL (ref 150–400)
PLATELETS: 291 10*3/uL (ref 150–400)
RBC: 2.65 MIL/uL — ABNORMAL LOW (ref 3.87–5.11)
RBC: 2.77 MIL/uL — AB (ref 3.87–5.11)
RDW: 16.2 % — ABNORMAL HIGH (ref 11.5–15.5)
RDW: 16.1 % — ABNORMAL HIGH (ref 11.5–15.5)
WBC: 9.5 10*3/uL (ref 4.0–10.5)
WBC: 12.7 10*3/uL — ABNORMAL HIGH (ref 4.0–10.5)

## 2017-07-25 LAB — PNEUMOCYSTIS JIROVECI SMEAR BY DFA: PNEUMOCYSTIS JIROVECI AG: NEGATIVE

## 2017-07-25 LAB — LEGIONELLA PNEUMOPHILA SEROGP 1 UR AG: L. pneumophila Serogp 1 Ur Ag: NEGATIVE

## 2017-07-25 LAB — RHEUMATOID FACTOR: Rhuematoid fact SerPl-aCnc: 19 IU/mL — ABNORMAL HIGH (ref 0.0–13.9)

## 2017-07-25 LAB — GLUCOSE, CAPILLARY
GLUCOSE-CAPILLARY: 179 mg/dL — AB (ref 65–99)
GLUCOSE-CAPILLARY: 248 mg/dL — AB (ref 65–99)
GLUCOSE-CAPILLARY: 267 mg/dL — AB (ref 65–99)
Glucose-Capillary: 280 mg/dL — ABNORMAL HIGH (ref 65–99)
Glucose-Capillary: 189 mg/dL — ABNORMAL HIGH (ref 65–99)
Glucose-Capillary: 248 mg/dL — ABNORMAL HIGH (ref 65–99)

## 2017-07-25 LAB — CYCLIC CITRUL PEPTIDE ANTIBODY, IGG/IGA: CCP Antibodies IgG/IgA: 18 units (ref 0–19)

## 2017-07-25 LAB — PROCALCITONIN

## 2017-07-25 LAB — MAGNESIUM
MAGNESIUM: 2 mg/dL (ref 1.7–2.4)
Magnesium: 2.7 mg/dL — ABNORMAL HIGH (ref 1.7–2.4)

## 2017-07-25 LAB — PHOSPHORUS
PHOSPHORUS: 6.2 mg/dL — AB (ref 2.5–4.6)
Phosphorus: 4.8 mg/dL — ABNORMAL HIGH (ref 2.5–4.6)

## 2017-07-25 LAB — TROPONIN I: Troponin I: 0.17 ng/mL (ref <0.03)

## 2017-07-25 MED ORDER — PRO-STAT SUGAR FREE PO LIQD
30.0000 mL | Freq: Every day | ORAL | Status: DC
  Administered 2017-07-25 – 2017-07-29 (×5): 30 mL
  Filled 2017-07-25 (×5): qty 30

## 2017-07-25 MED ORDER — VITAL HIGH PROTEIN PO LIQD
1000.0000 mL | ORAL | Status: DC
  Administered 2017-07-25 – 2017-07-28 (×4): 1000 mL
  Filled 2017-07-25 (×8): qty 1000

## 2017-07-25 MED ORDER — DEXMEDETOMIDINE HCL IN NACL 200 MCG/50ML IV SOLN
0.4000 ug/kg/h | INTRAVENOUS | Status: DC
  Administered 2017-07-25: 0.8 ug/kg/h via INTRAVENOUS
  Administered 2017-07-25: 0.4 ug/kg/h via INTRAVENOUS
  Administered 2017-07-25 (×2): 0.8 ug/kg/h via INTRAVENOUS
  Administered 2017-07-26: 1 ug/kg/h via INTRAVENOUS
  Administered 2017-07-26: 0.8 ug/kg/h via INTRAVENOUS
  Administered 2017-07-26 (×2): 1 ug/kg/h via INTRAVENOUS
  Administered 2017-07-26: 0.7 ug/kg/h via INTRAVENOUS
  Administered 2017-07-26 (×2): 0.8 ug/kg/h via INTRAVENOUS
  Administered 2017-07-26 (×2): 1 ug/kg/h via INTRAVENOUS
  Administered 2017-07-26: 0.8 ug/kg/h via INTRAVENOUS
  Filled 2017-07-25 (×14): qty 50

## 2017-07-25 MED ORDER — POTASSIUM PHOSPHATES 15 MMOLE/5ML IV SOLN
30.0000 mmol | Freq: Once | INTRAVENOUS | Status: AC
  Administered 2017-07-25: 30 mmol via INTRAVENOUS
  Filled 2017-07-25: qty 10

## 2017-07-25 MED ORDER — MAGNESIUM SULFATE 2 GM/50ML IV SOLN
2.0000 g | Freq: Once | INTRAVENOUS | Status: AC
  Administered 2017-07-25: 2 g via INTRAVENOUS
  Filled 2017-07-25: qty 50

## 2017-07-25 MED ORDER — SODIUM CHLORIDE 0.9 % IV SOLN
Freq: Once | INTRAVENOUS | Status: AC
  Administered 2017-07-25: 08:00:00 via INTRAVENOUS

## 2017-07-25 NOTE — Progress Notes (Signed)
Inpatient Diabetes Program Recommendations  AACE/ADA: New Consensus Statement on Inpatient Glycemic Control (2015)  Target Ranges:  Prepandial:   less than 140 mg/dL      Peak postprandial:   less than 180 mg/dL (1-2 hours)      Critically ill patients:  140 - 180 mg/dL   Lab Results  Component Value Date   GLUCAP 248 (H) 07/25/2017   HGBA1C 8.7 04/18/2017    Review of Glycemic Control Results for [name], [name] (MRN [id number]) as of 07/25/2017 09:34  Ref. Range 07/24/2017 23:22 07/25/2017 03:18 07/25/2017 07:51  Glucose-Capillary Latest Ref Range: 65 - 99 mg/dL 264 (H) 280 (H) 248 (H)   Diabetes history: type 2 DM Outpatient Diabetes medications: Levemir 36 units QHS, Prandin 2 mg TID, Tradjenta 5 mg QD Current orders for Inpatient glycemic control: Novolog 0-20 units Q4H, Levemir 20 units QHS,   Inpatient Diabetes Program Recommendations:    Noted Solumedrol 125 mg X1 dose, thus BS increased as would anticipate. Expecting BS to trend down today. In the event they remain elevated, could consider switching order sets to ICU Glycemic control (phase 1) with resistant correction. Will continue to watch.  Thanks, Bronson Curb, MSN, RNC-OB Diabetes Coordinator [phone] (8a-5p)

## 2017-07-25 NOTE — Progress Notes (Signed)
[facility] Progress Note Patient Name: ANIELLA WANDREY DOB: 03-Feb-1946 MRN: [id number]   Date of Service  07/25/2017  HPI/Events of Note  Acute blood loss anemia with Hgb of 6.7  eICU Interventions  Transfuse one unit PRBC Post-transfusion H & H        Ashira Kirsten U Zykera Abella 07/25/2017, 6:34 AM

## 2017-07-25 NOTE — Progress Notes (Signed)
[facility] Progress Note Patient Name: [name] DOB: 12-14-1945 MRN: [id number]   Date of Service  07/25/2017  HPI/Events of Note  ABG: 7.46/30.4/82/21.6/97 %  eICU Interventions  Reduce respiratory rate to 30, discontinue sodium bicarbonate infusion.        Kerry Kass Raizel Wesolowski 07/25/2017, 4:23 AM

## 2017-07-25 NOTE — Progress Notes (Signed)
Patient did not tolerate the metaneb with o2 sats dropping in low 80's. Patient's treatment was stopped and the patient's o2 sats increased to 100%.

## 2017-07-25 NOTE — Progress Notes (Signed)
Initial Nutrition Assessment  DOCUMENTATION CODES:   Obesity unspecified  INTERVENTION:   Tube Feeding:  Vital High Protein @ 45 ml/hr Pro-Stat 30 mL daily Provides 1180 kcals, 110 g of protein and   NUTRITION DIAGNOSIS:   Inadequate oral intake related to acute illness as evidenced by NPO status.  GOAL:   Provide needs based on ASPEN/SCCM guidelines  MONITOR:   Vent status, TF tolerance, Labs, Weight trends  REASON FOR ASSESSMENT:   Ventilator    ASSESSMENT:   72 yo female admitted with hypoxia and pleuritic chest pain on 5/7. On 5/8, pt with decline in respiratory status, rapid response called with PEA arrest, pt intubated, in septic and caridogenic shock requiring pressors ; pt with hx of CAD, CHF, CKD, DM, HTN, Stage IA right invasive ductal breast carcinoma, ER/PR+, HER2- s/p R breast lumpectomy and XRT in 2014 (declined adjuvant chemotherapy. S/p adjuvant breast radiation in 2015. Currently on anastrozole), lupus  5/7 Admitted with hypoxia, chest pain 5/8 Rapid Response, increased work of breathing, worsening hypoxia placed on BiPap 5/8 2nd rapid response, PEA arrest, Intubated, Bronch performed, Septic and Cardiogenic shock  Patient is currently intubated on ventilator support, currently on levophed and vasopressin MV: 13.0 L/min Temp (24hrs), Avg:98.5 F (36.9 C), Min:97.8 F (36.6 C), Max:98.9 F (37.2 C)  Propofol: discontinued today  Admission weight 188 pounds, current wt 201 pounds. Net positive 2.5 L since admission  Family at bedside and reports pt with very good appetite but does not eat a balanced diet. Reports pt would eat "fried chicken" every day if she could. Reports pt with intentional wt loss over the past several years but weight has been stable recently. UBW around 186-188 pounds  Labs: potassium 3,3, BUN 65, Creatinine 2.63, Hgb 6.8, CBGs 108-280 Meds: fentanyl, vasopressin, fentanyl, diprivan, sodium bicarb drip  NUTRITION - FOCUSED  PHYSICAL EXAM:    Most Recent Value  Orbital Region  No depletion  Upper Arm Region  No depletion  Thoracic and Lumbar Region  No depletion  Buccal Region  No depletion  Temple Region  No depletion  Clavicle Bone Region  No depletion  Clavicle and Acromion Bone Region  No depletion  Scapular Bone Region  No depletion  Dorsal Hand  Unable to assess  Patellar Region  No depletion  Anterior Thigh Region  No depletion  Posterior Calf Region  No depletion  Edema (RD Assessment)  Moderate       Diet Order:   Diet Order           Diet NPO time specified  Diet effective now          EDUCATION NEEDS:   Not appropriate for education at this time  Skin:  Skin Assessment: Reviewed RN Assessment  Last BM:  5/6  Height:   Ht Readings from Last 1 Encounters:  07/23/17 5' 4"  (1.626 m)    Weight:   Wt Readings from Last 1 Encounters:  07/25/17 201 lb 1 oz (91.2 kg)    Ideal Body Weight:  54.5 kg  BMI:  Body mass index is 34.51 kg/m.  Estimated Nutritional Needs:   Kcal:  [phone] kcals  Protein:  109-120 g  Fluid:  >/= 1.5 L   Kerman Passey MS, RD, LDN, CNSC [phone] Pager  [phone] Weekend/On-Call Pager

## 2017-07-25 NOTE — Progress Notes (Signed)
Name: [name] MRN: [id number] DOB: Jul 20, 1945    ADMISSION DATE:  07/23/2017 CONSULTATION DATE:  07/24/2017  REFERRING MD :  Dr. Ivor Costa  CHIEF COMPLAINT:  SOB  HISTORY OF PRESENT ILLNESS:  72 year old female with PMH as below, which is significant for Breast Ca (on anastrozole), CAD s/p DES, diastolic CHF, who was admitted to [facility] 5/7 with complaints of orthopnea and non-productive cough that have been progressive for 3 weeks. Acutely developed pleuritic chest pain, which prompted her to present to the clinic. In clinic she was hypoxic requiring nonrebeather to keep sats in the 90s, for which reason she was admitted. She was treated with CAP antibiotics, gentle IVF, and supplemental O2. Chest radiograph demonstrated large L sided infiltrate. She was felt to be volume overloaded on exam.  Despite Non-rebreather delivered O2, she remained hypotensive and somewhat hypotensive. She wast started on BiPAP and transferred to SDU. PCCM asked to see.   SIGNIFICANT EVENTS  5/7 admit 5/8 started on BiPAP and transferred to SDU. Brief junctional bradycardia w/ brief PEA arrest f/b cardiogenic & septic shock 3 pressor dependent   STUDIES:  CTA chest 5/8 >>>  abx Azith 5/7>>> Rocephin 5/8>> Vanc 5/8>>>  Cultures BAL 5/8>>> BCX2 5/8>>> u strep 5/7: neg u legionella 5/7>>>  SUBJECTIVE:  Hg drop overnight, transfuse 1 unit pRBC No further events overnight  VITAL SIGNS: Temp:  [97.8 F (36.6 C)-98.9 F (37.2 C)] 98.9 F (37.2 C) (05/09 1130) Pulse Rate:  [73-88] 79 (05/09 1123) Resp:  [16-35] 30 (05/09 1123) BP: (87-129)/(45-73) 116/47 (05/09 1123) SpO2:  [77 %-100 %] 99 % (05/09 1123) Arterial Line BP: (81-161)/(34-62) 111/46 (05/09 1000) FiO2 (%):  [100 %] 100 % (05/09 1123) Weight:  [201 lb 1 oz (91.2 kg)] 201 lb 1 oz (91.2 kg) (05/09 0454)  PHYSICAL EXAMINATION:  General 72 year old aaf sedated now on vent HENT: [place]/AT, PERRL, EOM-I and MMM Pulm: decreased BS on the  left, clear on the right Card bradycardia at times no MRG Ext now warm and dry + pulses trace LE edema Abd: Soft, NT, ND and +BS Neuro: intact  Recent Labs  Lab 07/24/17 0805 07/24/17 1444 07/25/17 0520  NA 141 141 141  K 4.0 4.1 3.3*  CL 107 106 104  CO2 22 18* 25  BUN 62* 66* 65*  CREATININE 2.77* 3.08* 2.63*  GLUCOSE 99 98 289*   Recent Labs  Lab 07/24/17 0609 07/24/17 0805 07/25/17 0520  HGB 6.6* 7.3* 6.7*  HCT 21.2* 23.7* 20.5*  WBC 2.2* 2.7* 9.5  PLT 270 295 285   Dg Chest 2 View  Result Date: 07/24/2017 CLINICAL DATA:  Pneumonia and shortness of breath EXAM: CHEST - 2 VIEW COMPARISON:  07/23/2017 FINDINGS: Cardiac shadow is stable but obscured by significant left-sided pulmonary infiltrate which is stable in appearance from the prior exam. The overall inspiratory effort is poor. No focal infiltrate is noted on the right. No sizable effusion is seen. IMPRESSION: Diffuse stable left-sided pneumonia. Electronically Signed   By: Inez Catalina M.D.   On: 07/24/2017 07:34   Dg Chest Port 1 View  Result Date: 07/25/2017 CLINICAL DATA:  Pneumonia, intubated EXAM: PORTABLE CHEST 1 VIEW COMPARISON:  07/24/2017 FINDINGS: Endotracheal tube, left subclavian central line, and NG tube remain, all stable positions. Exam is rotated to the left. Dense consolidative airspace disease throughout the left lung with central air bronchograms. Little change in aeration. Minor right base atelectasis. Difficult to exclude left effusion.  No significant pneumothorax. Axillary clips noted. IMPRESSION: Persistent consolidative and diffuse left lung airspace process compatible with pneumonia. No significant interval change. Electronically Signed   By: Jerilynn Mages.  Shick M.D.   On: 07/25/2017 10:18   Dg Chest Port 1 View  Result Date: 07/24/2017 CLINICAL DATA:  Chest pain EXAM: PORTABLE CHEST 1 VIEW COMPARISON:  07/24/2017 FINDINGS: Endotracheal tube nasogastric catheter and left subclavian central line are again  seen and stable. There is again significant opacification of the left hemithorax although some improved aeration in the apex and left mid lung laterally is noted. Right lung shows improved aeration when compared with the previous exam as well with only minimal central infiltrate identified. No bony abnormality is seen. IMPRESSION: Improving aeration bilaterally although significant left-sided infiltrate remains. Electronically Signed   By: Inez Catalina M.D.   On: 07/24/2017 15:26   Dg Chest Port 1 View  Result Date: 07/24/2017 CLINICAL DATA:  72 y/o F; central line placement and ET tube placement. EXAM: PORTABLE CHEST 1 VIEW COMPARISON:  07/23/2017 chest radiograph FINDINGS: Endotracheal tube tip 1.2 cm above the carina. Enteric tube tip below the field of view in the abdomen. Increased complete opacification of the left hemithorax patchy opacification in the right mid lung zone. Cardiac silhouette is largely obscured by left hemithorax opacification. Right axillary surgical clips. Bones are unremarkable. IMPRESSION: 1. Endotracheal tube tip 1.2 cm above carina. Enteric tube tip below field of view and abdomen. 2. Increased now complete opacification of left hemithorax and new mild right mid lung zone opacity. Electronically Signed   By: Kristine Garbe M.D.   On: 07/24/2017 13:37   Dg Chest Port 1 View  Result Date: 07/23/2017 CLINICAL DATA:  Shortness of breath. EXAM: PORTABLE CHEST 1 VIEW COMPARISON:  Radiographs of January 03, 2010. FINDINGS: Stable cardiomediastinal silhouette. No pneumothorax is noted. Right lung is clear. Large left lung airspace opacity is noted consistent with pneumonia. Some degree of pleural effusion cannot be excluded. Bony thorax is unremarkable. IMPRESSION: Large left-sided pneumonia. Followup PA and lateral chest X-ray is recommended in 3-4 weeks following trial of antibiotic therapy to ensure resolution and exclude underlying malignancy. Electronically Signed   By:  Marijo Conception, M.D.   On: 07/23/2017 19:25   ASSESSMENT / PLAN:  Acute Hypoxic respiratory failure- Left multilobar infiltrate/CAP after bronch raising suspicion for [facility] -Portable chest x-ray reviewed: Demonstrates patchy right-sided airspace disease but densely consolidated left-sided airspace disease occupying most of the left hemithorax -Placed on noninvasive, initially apparently did well with this, however has been desaturating -Intake output +200 mL -Urine strep negative, urine Legionella pending -Calcitonin impressive at 68.11 -she is failing attempts at NIPPV Plan Full vent support Titrate O2 and PEEP with goal today of 60% and PEEP of 12, drop to 80 and 14 now F/u bal D/C further steroids Serial cxr  SCD Hold [place] heparin given alveolar hemorrhage Abx as above  Brief PEA arrest following intubation. Bradycardic rhythm.  -less than 3 minutes Now in mix of cardiogenic and septic shock. ? Acute NSTEMI?  H/o CAD and diastolic HF  Plan Cards consulted, appreciate input Titrate levo,epi and vasopressin  Transduce CVP MAP goal > 65 Cont lipitor, fenofibrate Hold asa and plavix given blood in the bronchial tree, likely restart in AM  Chronic renal failure w/ acute on chronic renal failure  Plan MAP goal > 65 Renal dose meds Strict I&O F/u chem   At risk for delirium Plan D/C Propofol Continue IV fentanyl drip PRN  versed Supportive care  At risk for fluid and electrolyte imbalance Plan Trend chemistry  Replace lytes as needed KVO IVF BMET in AM Replace Mg and Phos  Anemia of chronic disease Plan scd Trend CBC Transfuse per protocol for hgb < 7  DM Glycemic well-controlled Plan Continue sliding-scale insulin  Stage IA right invasive ductal breast carcinoma, ER/PR+, HER2- s/p R breast lumpectomy and XRT in 2014. Now on daily anastrozole 5m  Plan Continuing anastrozole   Family updated at length bedside  DVT prophylaxis:LMWH-->PAS SUP: PPI    Diet: tubefeed  Activity: BR Disposition : ICU   The patient is critically ill with multiple organ systems failure and requires high complexity decision making for assessment and support, frequent evaluation and titration of therapies, application of advanced monitoring technologies and extensive interpretation of multiple databases.   Critical Care Time devoted to patient care services described in this note is  39  Minutes. This time reflects time of care of this signee Dr WJennet Maduro This critical care time does not reflect procedure time, or teaching time or supervisory time of PA/NP/Med student/Med Resident etc but could involve care discussion time.  WRush Farmer M.D. LPemiscot County Health CenterPulmonary/Critical Care Medicine. Pager: [phone] After hours pager: [phone]

## 2017-07-25 NOTE — Progress Notes (Signed)
CRITICAL VALUE ALERT  Critical Value:  6.7  Date & Time Notied:  07/25/2017 at [place]  Provider Notified: Dr. Lucile Shutters  Orders Received/Actions taken: awaiting new order

## 2017-07-25 NOTE — Progress Notes (Signed)
    Reviewed tele.  No further significant pauses.  No indication for temp pacer at this time.  Jettie Booze, MD

## 2017-07-25 NOTE — Progress Notes (Signed)
PT Cancellation Note  Patient Details Name: [name] MRN: [id number] DOB: [birth year]   Cancelled Treatment:    Reason Eval/Treat Not Completed: Medical issues which prohibited therapy per discussion with RN. Will follow-up for PT evaluation tomorrow if medically appropriate.  Mabeline Caras, PT, DPT Acute Rehab Services  Pager: [place] 07/25/2017, 10:22 AM

## 2017-07-26 ENCOUNTER — Inpatient Hospital Stay (HOSPITAL_COMMUNITY): Payer: Medicare Other

## 2017-07-26 DIAGNOSIS — R748 Abnormal levels of other serum enzymes: Secondary | ICD-10-CM

## 2017-07-26 LAB — MAGNESIUM
MAGNESIUM: 2.5 mg/dL — AB (ref 1.7–2.4)
Magnesium: 2.7 mg/dL — ABNORMAL HIGH (ref 1.7–2.4)

## 2017-07-26 LAB — CBC
HEMATOCRIT: 22.4 % — AB (ref 36.0–46.0)
Hemoglobin: 7.1 g/dL — ABNORMAL LOW (ref 12.0–15.0)
MCH: 24.9 pg — ABNORMAL LOW (ref 26.0–34.0)
MCHC: 31.7 g/dL (ref 30.0–36.0)
MCV: 78.6 fL (ref 78.0–100.0)
PLATELETS: 291 10*3/uL (ref 150–400)
RBC: 2.85 MIL/uL — AB (ref 3.87–5.11)
RDW: 16.3 % — ABNORMAL HIGH (ref 11.5–15.5)
WBC: 18.4 10*3/uL — AB (ref 4.0–10.5)

## 2017-07-26 LAB — POCT I-STAT 3, ART BLOOD GAS (G3+)
Bicarbonate: 25.5 mmol/L (ref 20.0–28.0)
O2 SAT: 99 %
TCO2: 27 mmol/L (ref 22–32)
pCO2 arterial: 45.8 mmHg (ref 32.0–48.0)
pH, Arterial: 7.354 (ref 7.350–7.450)
pO2, Arterial: 148 mmHg — ABNORMAL HIGH (ref 83.0–108.0)

## 2017-07-26 LAB — TYPE AND SCREEN
ABO/RH(D): B NEG
Antibody Screen: NEGATIVE
UNIT DIVISION: 0

## 2017-07-26 LAB — GLUCOSE, CAPILLARY
Glucose-Capillary: 260 mg/dL — ABNORMAL HIGH (ref 65–99)
Glucose-Capillary: 237 mg/dL — ABNORMAL HIGH (ref 65–99)
Glucose-Capillary: 228 mg/dL — ABNORMAL HIGH (ref 65–99)
Glucose-Capillary: 239 mg/dL — ABNORMAL HIGH (ref 65–99)
Glucose-Capillary: 260 mg/dL — ABNORMAL HIGH (ref 65–99)

## 2017-07-26 LAB — PHOSPHORUS
PHOSPHORUS: 6.1 mg/dL — AB (ref 2.5–4.6)
PHOSPHORUS: 4.7 mg/dL — AB (ref 2.5–4.6)

## 2017-07-26 LAB — CULTURE, BAL-QUANTITATIVE: CULTURE: NO GROWTH

## 2017-07-26 LAB — CULTURE, BAL-QUANTITATIVE W GRAM STAIN

## 2017-07-26 LAB — BPAM RBC
BLOOD PRODUCT EXPIRATION DATE: 201906012359
ISSUE DATE / TIME: 201905090755
Unit Type and Rh: 1700

## 2017-07-26 LAB — BASIC METABOLIC PANEL
ANION GAP: 13 (ref 5–15)
BUN: 83 mg/dL — ABNORMAL HIGH (ref 6–20)
CO2: 23 mmol/L (ref 22–32)
Calcium: 7.4 mg/dL — ABNORMAL LOW (ref 8.9–10.3)
Chloride: 100 mmol/L — ABNORMAL LOW (ref 101–111)
Creatinine, Ser: 3.04 mg/dL — ABNORMAL HIGH (ref 0.44–1.00)
GFR calc Af Amer: 17 mL/min — ABNORMAL LOW (ref >60)
GFR, EST NON AFRICAN AMERICAN: 15 mL/min — AB (ref >60)
GLUCOSE: 277 mg/dL — AB (ref 65–99)
POTASSIUM: 4.6 mmol/L (ref 3.5–5.1)
Sodium: 136 mmol/L (ref 135–145)

## 2017-07-26 LAB — ANTINUCLEAR ANTIBODIES, IFA: ANA Ab, IFA: NEGATIVE

## 2017-07-26 LAB — PROCALCITONIN: Procalcitonin: >150 ng/mL

## 2017-07-26 MED ORDER — ASPIRIN 81 MG PO CHEW
81.0000 mg | CHEWABLE_TABLET | Freq: Every day | ORAL | Status: DC
  Administered 2017-07-26 – 2017-07-28 (×3): 81 mg
  Filled 2017-07-26 (×3): qty 1

## 2017-07-26 MED ORDER — FUROSEMIDE 10 MG/ML IJ SOLN
80.0000 mg | Freq: Once | INTRAMUSCULAR | Status: AC
  Administered 2017-07-26: 80 mg via INTRAVENOUS
  Filled 2017-07-26: qty 8

## 2017-07-26 MED ORDER — HEPARIN SODIUM (PORCINE) 5000 UNIT/ML IJ SOLN
5000.0000 [IU] | Freq: Three times a day (TID) | INTRAMUSCULAR | Status: DC
  Administered 2017-07-26 – 2017-08-06 (×34): 5000 [IU] via SUBCUTANEOUS
  Filled 2017-07-26 (×33): qty 1

## 2017-07-26 MED ORDER — POLYETHYLENE GLYCOL 3350 17 G PO PACK
17.0000 g | PACK | Freq: Every day | ORAL | Status: DC
  Administered 2017-07-26 – 2017-07-29 (×4): 17 g via ORAL
  Filled 2017-07-26 (×4): qty 1

## 2017-07-26 MED ORDER — CLOPIDOGREL BISULFATE 75 MG PO TABS
75.0000 mg | ORAL_TABLET | Freq: Every day | ORAL | Status: DC
  Administered 2017-07-26 – 2017-07-28 (×3): 75 mg
  Filled 2017-07-26 (×3): qty 1

## 2017-07-26 NOTE — Progress Notes (Signed)
Results for SANAYAH, MUNRO (MRN [id number]) as of 07/26/2017 14:04  Ref. Range 07/25/2017 19:32 07/25/2017 23:40 07/26/2017 03:05 07/26/2017 07:34 07/26/2017 11:08  Glucose-Capillary Latest Ref Range: 65 - 99 mg/dL 248 (H) 267 (H) 260 (H) 237 (H) 228 (H)  Noted that CBGs have been greater than 180 mg/dl. Recommend increasing Levemir to 25 units daily. Patient may benefit from being on ICU hyperglycemia protocol since patient in intubated and on continuous tube feedings.   Harvel Ricks RN BSN CDE Diabetes Coordinator Pager: [phone]  8am-5pm

## 2017-07-26 NOTE — Progress Notes (Signed)
Name: [name] MRN: [id number] DOB: Feb 17, 1946    ADMISSION DATE:  07/23/2017 CONSULTATION DATE:  07/24/2017  REFERRING MD :  Dr. Ivor Costa  CHIEF COMPLAINT:  SOB  HISTORY OF PRESENT ILLNESS:  72 year old female with PMH as below, which is significant for Breast Ca (on anastrozole), CAD s/p DES, diastolic CHF, who was admitted to [facility] 5/7 with complaints of orthopnea and non-productive cough that have been progressive for 3 weeks. Acutely developed pleuritic chest pain, which prompted her to present to the clinic. In clinic she was hypoxic requiring nonrebeather to keep sats in the 90s, for which reason she was admitted. She was treated with CAP antibiotics, gentle IVF, and supplemental O2. Chest radiograph demonstrated large L sided infiltrate. She was felt to be volume overloaded on exam.  Despite Non-rebreather delivered O2, she remained hypotensive and somewhat hypotensive. She wast started on BiPAP and transferred to SDU. PCCM asked to see.   SIGNIFICANT EVENTS  5/7 admit 5/8 started on BiPAP and transferred to SDU. Brief junctional bradycardia w/ brief PEA arrest f/b cardiogenic & septic shock 3 pressor dependent   STUDIES:   abx Azith 5/7>>> Rocephin 5/7>> Vanc 5/8>>>  Cultures BAL 5/8>>> BCX2 5/8>>> u strep 5/7: neg u legionella 5/7>>> Pneumocystis smear negative  SUBJECTIVE:  No further events, hemodynamically stable, off pressors.  Requiring sedation.    VITAL SIGNS: Temp:  [98.8 F (37.1 C)-100 F (37.8 C)] 99.5 F (37.5 C) (05/10 1124) Pulse Rate:  [79-89] 79 (05/10 1137) Resp:  [15-30] 20 (05/10 1137) BP: (83-150)/(46-98) 111/46 (05/10 1137) SpO2:  [81 %-100 %] 100 % (05/10 1137) Arterial Line BP: (88-149)/(42-59) 105/44 (05/10 1000) FiO2 (%):  [60 %-100 %] 60 % (05/10 1137) Weight:  [203 lb 11.3 oz (92.4 kg)] 203 lb 11.3 oz (92.4 kg) (05/10 0340)  Intake/Output Summary (Last 24 hours) at 07/26/2017 1258 Last data filed at 07/26/2017 1234 Gross  per 24 hour  Intake 2203.43 ml  Output 805 ml  Net 1398.43 ml    PHYSICAL EXAMINATION:  General: 72 year old female patient currently sedated on the ventilator HEENT: Normocephalic atraumatic orally intubated does appear to have some mild jugular venous distention Pulmonary: Coarse left greater than right no accessory use on full ventilator support weaning PEEP and FiO2 Cardiac: Regular rate and rhythm without murmur rub or gallop. Abdomen: Soft nontender no organomegaly Extremities: Trace lower extremity edema brisk cap refill warm. Neuro: Currently sedated nonfocal.  Moves all extremities but not following commands  Recent Labs  Lab 07/24/17 1444 07/25/17 0520 07/26/17 0147  NA 141 141 136  K 4.1 3.3* 4.6  CL 106 104 100*  CO2 18* 25 23  BUN 66* 65* 83*  CREATININE 3.08* 2.63* 3.04*  GLUCOSE 98 289* 277*   Recent Labs  Lab 07/25/17 0520 07/25/17 1340 07/26/17 0147  HGB 6.7* 7.0* 7.1*  HCT 20.5* 21.6* 22.4*  WBC 9.5 12.7* 18.4*  PLT 285 291 291   Dg Chest Port 1 View  Result Date: 07/26/2017 CLINICAL DATA:  History of endotracheal tube EXAM: PORTABLE CHEST 1 VIEW COMPARISON:  Yesterday FINDINGS: Endotracheal tube tip at the clavicular heads. An orogastric tube reaches the stomach. Left subclavian line with tip at the SVC. Extensive airspace disease on the left has mildly improved since yesterday. There is cardiomegaly with coronary stent. No pneumothorax. IMPRESSION: 1. Stable positioning of tubes and central line. 2. Extensive airspace disease on the left with mild interval improvement. Electronically Signed   By: Angelica Chessman  Watts M.D.   On: 07/26/2017 11:28   Dg Chest Port 1 View  Result Date: 07/25/2017 CLINICAL DATA:  Pneumonia, intubated EXAM: PORTABLE CHEST 1 VIEW COMPARISON:  07/24/2017 FINDINGS: Endotracheal tube, left subclavian central line, and NG tube remain, all stable positions. Exam is rotated to the left. Dense consolidative airspace disease throughout the  left lung with central air bronchograms. Little change in aeration. Minor right base atelectasis. Difficult to exclude left effusion. No significant pneumothorax. Axillary clips noted. IMPRESSION: Persistent consolidative and diffuse left lung airspace process compatible with pneumonia. No significant interval change. Electronically Signed   By: Jerilynn Mages.  Shick M.D.   On: 07/25/2017 10:18   Dg Chest Port 1 View  Result Date: 07/24/2017 CLINICAL DATA:  Chest pain EXAM: PORTABLE CHEST 1 VIEW COMPARISON:  07/24/2017 FINDINGS: Endotracheal tube nasogastric catheter and left subclavian central line are again seen and stable. There is again significant opacification of the left hemithorax although some improved aeration in the apex and left mid lung laterally is noted. Right lung shows improved aeration when compared with the previous exam as well with only minimal central infiltrate identified. No bony abnormality is seen. IMPRESSION: Improving aeration bilaterally although significant left-sided infiltrate remains. Electronically Signed   By: Inez Catalina M.D.   On: 07/24/2017 15:26   Dg Chest Port 1 View  Result Date: 07/24/2017 CLINICAL DATA:  72 y/o F; central line placement and ET tube placement. EXAM: PORTABLE CHEST 1 VIEW COMPARISON:  07/23/2017 chest radiograph FINDINGS: Endotracheal tube tip 1.2 cm above the carina. Enteric tube tip below the field of view in the abdomen. Increased complete opacification of the left hemithorax patchy opacification in the right mid lung zone. Cardiac silhouette is largely obscured by left hemithorax opacification. Right axillary surgical clips. Bones are unremarkable. IMPRESSION: 1. Endotracheal tube tip 1.2 cm above carina. Enteric tube tip below field of view and abdomen. 2. Increased now complete opacification of left hemithorax and new mild right mid lung zone opacity. Electronically Signed   By: Kristine Garbe M.D.   On: 07/24/2017 13:37   ASSESSMENT /  PLAN:  Acute Hypoxic respiratory failure- Left multilobar infiltrate/CAP after bronch raising suspicion for [facility] Portable chest x-ray demonstrating consolidative airspace disease on the left.  Endotracheal tube and subclavian line in satisfactory position.  Improved aeration when comparing prior films Weaning PEEP and FiO2 Procalcitonin remains greater than 150 Plan To new full ventilator support Goal for today is to wean FiO2, once FiO2 less than 50 we will try to decrease PEEP to 10 repeat chest x-ray in a.m.  follow-up bronchoalveolar lavage results  Trend procalcitonin Lasix x1 Day #3 Rocephin and day #2 vancomycin Although procalcitonin climbing x-ray and clinical status seems to improve so we will continue current antibiotic regimen for now  Brief PEA arrest following intubation. Bradycardic rhythm.  -less than 3 minutes Now in mix of cardiogenic and septic shock. ? Acute NSTEMI?  H/o CAD and diastolic HF  -Shock resolved; cardiology signed off does not feel like this was an acute non-ST elevation MI but more likely demand ischemia Plan Continue telemetry monitoring KVO IV fluids Continue Lipitor and fenofibrate Resume aspirin and Plavix   Chronic renal failure w/ acute on chronic renal failure  -Making decent urine.  Creatinine climbing compared to yesterday but less than the day prior.   baseline creatinine runs around 1.7-2.3 Plan Lasix x1 Strict intake output Renal dose medications Follow-up a.m. chemistry  Acute encephalopathy.  This is new following intubation.  Suspect mostly metabolic and origin but did have brief less than 3-minute cardiac arrest Plan PAD protocol RASS goal -2 Anticipate discontinuing fentanyl infusion in the next 24 to 48 hours, continue Precedex, as she is not ready to wean currently we will hold infusions where she is at  At risk for fluid and electrolyte imbalance Plan Repeat a.m. Chemistry  Anemia of chronic disease No evidence of  active bleeding currently Plan Trend CBC Transfuse for hemoglobin less than 7  Diabetes with hyperglycemia Blood glucoses exceeding 200 Plan Add basal insulin dosing Continue sliding scale insulin  Stage IA right invasive ductal breast carcinoma, ER/PR+, HER2- s/p R breast lumpectomy and XRT in 2014. Now on daily anastrozole 64m  Plan Continuing anastrozole follow-up outpatient  Family updated at length bedside  DVT prophylaxis:LMWH-->PAS, start subcu heparin SUP: PPI  Diet: tubefeed  Activity: BR Disposition : ICU   My critical care x35 minutes  PErick ColaceACNP-BC LGlassportPager # [phone] # [phone] no answer

## 2017-07-26 NOTE — Progress Notes (Addendum)
Progress Note  Patient Name: [name] Date of Encounter: 07/26/2017  Primary Cardiologist: No primary care provider on file. Dr. Johnsie Cancel  Subjective   Patient intubated.  Being treated for respiratory failure.  Inpatient Medications    Scheduled Meds: . anastrozole  1 mg Per Tube Daily  . atorvastatin  40 mg Oral QHS  . chlorhexidine gluconate (MEDLINE KIT)  15 mL Mouth Rinse BID  . feeding supplement (PRO-STAT SUGAR FREE 64)  30 mL Per Tube Daily  . fenofibrate  54 mg Per Tube Daily  . fentaNYL (SUBLIMAZE) injection  50 mcg Intravenous Once  . insulin aspart  0-20 Units Subcutaneous Q4H  . insulin detemir  20 Units Subcutaneous QHS  . ipratropium-albuterol  3 mL Nebulization Q4H  . mouth rinse  15 mL Mouth Rinse 10 times per day  . midazolam  2 mg Intravenous Once   Continuous Infusions: . sodium chloride    . azithromycin Stopped (07/26/17 0316)  . cefTRIAXone (ROCEPHIN)  IV Stopped (07/25/17 2010)  . dexmedetomidine (PRECEDEX) IV infusion 0.7 mcg/kg/hr (07/26/17 0811)  . famotidine (PEPCID) IV Stopped (07/25/17 1530)  . feeding supplement (VITAL HIGH PROTEIN) 45 mL/hr at 07/25/17 1900  . fentaNYL infusion INTRAVENOUS 200 mcg/hr (07/26/17 0636)  . norepinephrine (LEVOPHED) Adult infusion Stopped (07/26/17 8882)  . vancomycin Stopped (07/25/17 1723)  . vasopressin (PITRESSIN) infusion - *FOR SHOCK* Stopped (07/26/17 8003)   PRN Meds: acetaminophen **OR** acetaminophen, albuterol, fentaNYL   Vital Signs    Vitals:   07/26/17 0800 07/26/17 0900 07/26/17 1000 07/26/17 1124  BP: 125/88 (!) 97/54 (!) 99/54   Pulse: 83 79 79   Resp: 18 19 18    Temp:    99.5 F (37.5 C)  TempSrc:    Oral  SpO2: 97% 99% 99%   Weight:      Height:        Intake/Output Summary (Last 24 hours) at 07/26/2017 1130 Last data filed at 07/26/2017 1000 Gross per 24 hour  Intake 1840.5 ml  Output 825 ml  Net 1015.5 ml   Filed Weights   07/24/17 0639 07/25/17 0454 07/26/17 0340    Weight: 188 lb (85.3 kg) 201 lb 1 oz (91.2 kg) 203 lb 11.3 oz (92.4 kg)    Telemetry    NSR - Personally Reviewed  ECG      Physical Exam   GEN: intubated, sedated.   Neck: No JVD Cardiac: RRR, no murmurs, rubs, or gallops.  Respiratory: Clear to auscultation bilaterally. GI: Soft, nontender, non-distended  MS: No edema; No deformity. Neuro: intubated , sedated  Labs    Chemistry Recent Labs  Lab 07/24/17 1444 07/25/17 0520 07/26/17 0147  NA 141 141 136  K 4.1 3.3* 4.6  CL 106 104 100*  CO2 18* 25 23  GLUCOSE 98 289* 277*  BUN 66* 65* 83*  CREATININE 3.08* 2.63* 3.04*  CALCIUM 8.1* 7.6* 7.4*  PROT 6.4* 5.9*  --   ALBUMIN 2.3* 2.1*  --   AST 145* 94*  --   ALT 47 52  --   ALKPHOS 69 81  --   BILITOT 0.9 1.4*  --   GFRNONAA 14* 17* 15*  GFRAA 17* 20* 17*  ANIONGAP 17* 12 13     Hematology Recent Labs  Lab 07/25/17 0520 07/25/17 1340 07/26/17 0147  WBC 9.5 12.7* 18.4*  RBC 2.65* 2.77* 2.85*  HGB 6.7* 7.0* 7.1*  HCT 20.5* 21.6* 22.4*  MCV 77.4* 78.0 78.6  MCH 25.3* 25.3*  24.9*  MCHC 32.7 32.4 31.7  RDW 16.2* 16.1* 16.3*  PLT 285 291 291    Cardiac Enzymes Recent Labs  Lab 07/24/17 0609 07/24/17 1444 07/24/17 1900 07/25/17 0520  TROPONINI <0.03 0.03* 0.14* 0.17*   No results for input(s): TROPIPOC in the last 168 hours.   BNP Recent Labs  Lab 07/23/17 1214  BNP 176.4*     DDimer No results for input(s): DDIMER in the last 168 hours.   Radiology    Dg Chest Port 1 View  Result Date: 07/25/2017 CLINICAL DATA:  Pneumonia, intubated EXAM: PORTABLE CHEST 1 VIEW COMPARISON:  07/24/2017 FINDINGS: Endotracheal tube, left subclavian central line, and NG tube remain, all stable positions. Exam is rotated to the left. Dense consolidative airspace disease throughout the left lung with central air bronchograms. Little change in aeration. Minor right base atelectasis. Difficult to exclude left effusion. No significant pneumothorax. Axillary clips  noted. IMPRESSION: Persistent consolidative and diffuse left lung airspace process compatible with pneumonia. No significant interval change. Electronically Signed   By: Jerilynn Mages.  Shick M.D.   On: 07/25/2017 10:18   Dg Chest Port 1 View  Result Date: 07/24/2017 CLINICAL DATA:  Chest pain EXAM: PORTABLE CHEST 1 VIEW COMPARISON:  07/24/2017 FINDINGS: Endotracheal tube nasogastric catheter and left subclavian central line are again seen and stable. There is again significant opacification of the left hemithorax although some improved aeration in the apex and left mid lung laterally is noted. Right lung shows improved aeration when compared with the previous exam as well with only minimal central infiltrate identified. No bony abnormality is seen. IMPRESSION: Improving aeration bilaterally although significant left-sided infiltrate remains. Electronically Signed   By: Inez Catalina M.D.   On: 07/24/2017 15:26   Dg Chest Port 1 View  Result Date: 07/24/2017 CLINICAL DATA:  72 y/o F; central line placement and ET tube placement. EXAM: PORTABLE CHEST 1 VIEW COMPARISON:  07/23/2017 chest radiograph FINDINGS: Endotracheal tube tip 1.2 cm above the carina. Enteric tube tip below the field of view in the abdomen. Increased complete opacification of the left hemithorax patchy opacification in the right mid lung zone. Cardiac silhouette is largely obscured by left hemithorax opacification. Right axillary surgical clips. Bones are unremarkable. IMPRESSION: 1. Endotracheal tube tip 1.2 cm above carina. Enteric tube tip below field of view and abdomen. 2. Increased now complete opacification of left hemithorax and new mild right mid lung zone opacity. Electronically Signed   By: Kristine Garbe M.D.   On: 07/24/2017 13:37    Cardiac Studies   Hand held echo 2 days ago showed grossly normal LF function  Patient Profile     72 y.o. female with respiratory failure  Assessment & Plan    1) PEA arrest 2 days ago.   No further bradycardia.  2) Mild troponin elevation.  Likely demand ischemia and not true ACS.  Would not consider true NSTEMI.  Will sign off for now.  Please call with questions.   For questions or updates, please contact [place] Please consult [email or web address] for contact info under Cardiology/STEMI.      Signed, Larae Grooms, MD  07/26/2017, 11:30 AM

## 2017-07-26 NOTE — Progress Notes (Signed)
PT Cancellation Note  Patient Details Name: [name] MRN: [id number] DOB: 09-Mar-1946   Cancelled Treatment:    Reason Eval/Treat Not Completed: (P) Medical issues which prohibited therapy Pt with increased respiratory need (FiO2 80%, pEEP 14) and elevated troponins. RN reports vent weaning will most likely start Monday. Will plan to Evaluate on Monday.   Jaggar Benko B. Migdalia Dk PT, DPT Acute Rehabilitation  [phone] Pager [phone]     [place] 07/26/2017, 10:09 AM

## 2017-07-27 ENCOUNTER — Inpatient Hospital Stay (HOSPITAL_COMMUNITY): Payer: Medicare Other

## 2017-07-27 DIAGNOSIS — I361 Nonrheumatic tricuspid (valve) insufficiency: Secondary | ICD-10-CM

## 2017-07-27 DIAGNOSIS — I340 Nonrheumatic mitral (valve) insufficiency: Secondary | ICD-10-CM

## 2017-07-27 LAB — COMPREHENSIVE METABOLIC PANEL
ALK PHOS: 245 U/L — AB (ref 38–126)
ALT: 52 U/L (ref 14–54)
AST: 68 U/L — ABNORMAL HIGH (ref 15–41)
Albumin: 1.9 g/dL — ABNORMAL LOW (ref 3.5–5.0)
Anion gap: 13 (ref 5–15)
BILIRUBIN TOTAL: 0.6 mg/dL (ref 0.3–1.2)
BUN: 102 mg/dL — ABNORMAL HIGH (ref 6–20)
CALCIUM: 7.7 mg/dL — AB (ref 8.9–10.3)
CO2: 25 mmol/L (ref 22–32)
Chloride: 104 mmol/L (ref 101–111)
Creatinine, Ser: 3.03 mg/dL — ABNORMAL HIGH (ref 0.44–1.00)
GFR, EST AFRICAN AMERICAN: 17 mL/min — AB (ref >60)
GFR, EST NON AFRICAN AMERICAN: 15 mL/min — AB (ref >60)
GLUCOSE: 243 mg/dL — AB (ref 65–99)
POTASSIUM: 4.2 mmol/L (ref 3.5–5.1)
Sodium: 142 mmol/L (ref 135–145)
TOTAL PROTEIN: 6.9 g/dL (ref 6.5–8.1)

## 2017-07-27 LAB — LACTIC ACID, PLASMA: LACTIC ACID, VENOUS: 1.7 mmol/L (ref 0.5–1.9)

## 2017-07-27 LAB — CBC
HEMATOCRIT: 22.4 % — AB (ref 36.0–46.0)
HEMOGLOBIN: 7.1 g/dL — AB (ref 12.0–15.0)
MCH: 24.9 pg — AB (ref 26.0–34.0)
MCHC: 31.7 g/dL (ref 30.0–36.0)
MCV: 78.6 fL (ref 78.0–100.0)
Platelets: 256 10*3/uL (ref 150–400)
RBC: 2.85 MIL/uL — ABNORMAL LOW (ref 3.87–5.11)
RDW: 16.9 % — AB (ref 11.5–15.5)
WBC: 24.7 10*3/uL — ABNORMAL HIGH (ref 4.0–10.5)

## 2017-07-27 LAB — PHOSPHORUS: PHOSPHORUS: 4.1 mg/dL (ref 2.5–4.6)

## 2017-07-27 LAB — GLUCOSE, CAPILLARY
GLUCOSE-CAPILLARY: 267 mg/dL — AB (ref 65–99)
GLUCOSE-CAPILLARY: 228 mg/dL — AB (ref 65–99)
GLUCOSE-CAPILLARY: 216 mg/dL — AB (ref 65–99)
Glucose-Capillary: 193 mg/dL — ABNORMAL HIGH (ref 65–99)
Glucose-Capillary: 219 mg/dL — ABNORMAL HIGH (ref 65–99)
Glucose-Capillary: 289 mg/dL — ABNORMAL HIGH (ref 65–99)

## 2017-07-27 LAB — MAGNESIUM: MAGNESIUM: 2.4 mg/dL (ref 1.7–2.4)

## 2017-07-27 LAB — ECHOCARDIOGRAM COMPLETE
HEIGHTINCHES: 64 in
Weight: 3319.25 oz

## 2017-07-27 LAB — TROPONIN I: Troponin I: 1.96 ng/mL (ref <0.03)

## 2017-07-27 MED ORDER — INSULIN DETEMIR 100 UNIT/ML ~~LOC~~ SOLN: 22.0000 [IU] | Freq: Every day | SUBCUTANEOUS | Status: DC

## 2017-07-27 MED ORDER — INSULIN DETEMIR 100 UNIT/ML ~~LOC~~ SOLN
22.0000 [IU] | Freq: Every day | SUBCUTANEOUS | Status: DC
  Administered 2017-07-27: 22 [IU] via SUBCUTANEOUS
  Filled 2017-07-27: qty 0.22

## 2017-07-27 MED ORDER — DEXMEDETOMIDINE HCL IN NACL 400 MCG/100ML IV SOLN
0.4000 ug/kg/h | INTRAVENOUS | Status: DC
  Administered 2017-07-27 (×6): 1 ug/kg/h via INTRAVENOUS
  Administered 2017-07-28 (×6): 1.2 ug/kg/h via INTRAVENOUS
  Administered 2017-07-29: 0.6 ug/kg/h via INTRAVENOUS
  Administered 2017-07-29 (×2): 1.2 ug/kg/h via INTRAVENOUS
  Administered 2017-07-29: 0.6 ug/kg/h via INTRAVENOUS
  Administered 2017-07-29: 1.2 ug/kg/h via INTRAVENOUS
  Administered 2017-07-30 – 2017-07-31 (×5): 0.6 ug/kg/h via INTRAVENOUS
  Filled 2017-07-27 (×20): qty 100

## 2017-07-27 MED ORDER — FUROSEMIDE 10 MG/ML IJ SOLN
40.0000 mg | Freq: Once | INTRAMUSCULAR | Status: AC
  Administered 2017-07-27: 40 mg via INTRAVENOUS
  Filled 2017-07-27: qty 4

## 2017-07-27 MED ORDER — VANCOMYCIN HCL IN DEXTROSE 1-5 GM/200ML-% IV SOLN
1000.0000 mg | INTRAVENOUS | Status: DC
  Filled 2017-07-27: qty 200

## 2017-07-27 MED ORDER — LEVOFLOXACIN IN D5W 500 MG/100ML IV SOLN
500.0000 mg | INTRAVENOUS | Status: DC
  Administered 2017-07-27: 500 mg via INTRAVENOUS
  Filled 2017-07-27: qty 100

## 2017-07-27 MED ORDER — STERILE WATER FOR INJECTION IJ SOLN
INTRAMUSCULAR | Status: AC
  Filled 2017-07-27: qty 10

## 2017-07-27 NOTE — Progress Notes (Signed)
CRITICAL VALUE ALERT  Critical Value:  Troponin 1.96  Date & Time Notied:  07/27/2017 0817  Provider Notified: Dr. Chase Caller  Orders Received/Actions taken: EKG ordered. See new orders

## 2017-07-27 NOTE — Progress Notes (Signed)
Name: [name] MRN: [id number] DOB: Aug 31, 1945    ADMISSION DATE:  07/23/2017 CONSULTATION DATE:  07/24/2017  REFERRING MD :  Dr. Ivor Costa  CHIEF COMPLAINT:  SOB  HISTORY OF PRESENT ILLNESS:  72 year old female with PMH as below, which is significant for Breast Ca (on anastrozole), CAD s/p DES, diastolic CHF, who was admitted to [facility] 5/7 with complaints of orthopnea and non-productive cough that have been progressive for 3 weeks. Acutely developed pleuritic chest pain, which prompted her to present to the clinic. In clinic she was hypoxic requiring nonrebeather to keep sats in the 90s, for which reason she was admitted. She was treated with CAP antibiotics, gentle IVF, and supplemental O2. Chest radiograph demonstrated large L sided infiltrate. She was felt to be volume overloaded on exam.  Despite Non-rebreather delivered O2, she remained hypotensive and somewhat hypotensive. She wast started on BiPAP and transferred to SDU. PCCM asked to see.   SIGNIFICANT EVENTS  5/7 admit 5/8 started on BiPAP and transferred to SDU. Brief junctional bradycardia w/ brief PEA arrest f/b cardiogenic & septic shock 3 pressor dependent   STUDIES:   abx Azith 5/7>>> Rocephin 5/7>> Vanc 5/8>>>  Cultures BAL 5/8>>> BCX2 5/8>>> u strep 5/7: neg u legionella 5/7>>> Pneumocystis smear negative  SUBJECTIVE:  Remains on precedex and fent .  Not weaning with increased PEEP /FiO2 .   VITAL SIGNS: Temp:  [98.5 F (36.9 C)-99.1 F (37.3 C)] 98.5 F (36.9 C) (05/11 0800) Pulse Rate:  [67-93] 83 (05/11 1118) Resp:  [15-23] 21 (05/11 1118) BP: (95-148)/(52-85) 126/60 (05/11 1118) SpO2:  [89 %-97 %] 92 % (05/11 1118) Arterial Line BP: (113-262)/(34-247) 148/49 (05/11 1000) FiO2 (%):  [50 %-60 %] 60 % (05/11 1118) Weight:  [207 lb 7.3 oz (94.1 kg)] 207 lb 7.3 oz (94.1 kg) (05/11 0500)  Intake/Output Summary (Last 24 hours) at 07/27/2017 1155 Last data filed at 07/27/2017 0900 Gross per 24  hour  Intake 2221.28 ml  Output 1185 ml  Net 1036.28 ml    PHYSICAL EXAMINATION:  General:  72 year old female sedated on ventilator  HEENT: Normocephalic atraumatic intubated ET tube  Pulmonary: Scattered rhonchi decreased breath sounds on the left coarse left greater than right no accessor Cardiac: Regular rate and rhythm without murmur rub or gallop Abdomen: Obese soft and nontender bowel sounds positive Extremities: Trace lower extremity edema  Neuro: Sedated on vent not following commands   Recent Labs  Lab 07/25/17 0520 07/26/17 0147 07/27/17 0626  NA 141 136 142  K 3.3* 4.6 4.2  CL 104 100* 104  CO2 _0 BUN 65* 83* 102*  CREATININE 2.63* 3.04* 3.03*  GLUCOSE 289* 277* 243*   Recent Labs  Lab 07/25/17 1340 07/26/17 0147 07/27/17 0626  HGB 7.0* 7.1* 7.1*  HCT 21.6* 22.4* 22.4*  WBC 12.7* 18.4* 24.7*  PLT 291 291 256   Dg Chest Port 1 View  Result Date: 07/27/2017 CLINICAL DATA:  Pneumonia. EXAM: PORTABLE CHEST 1 VIEW COMPARISON:  Jul 26, 2017 FINDINGS: The ETT and left central line are stable. The NG tube terminates below today's film. No pneumothorax. A diffuse opacity in the left lung is stable. No focal opacity on the right. Stable cardiomediastinal silhouette. IMPRESSION: 1. Support apparatus as above. 2. Persistent diffuse opacity throughout the left lung. Recommend follow-up to complete resolution. Electronically Signed   By: Dorise Bullion III M.D   On: 07/27/2017 08:14   Dg Chest Port 1 [address]  Result Date: 07/26/2017 CLINICAL DATA:  History of endotracheal tube EXAM: PORTABLE CHEST 1 VIEW COMPARISON:  Yesterday FINDINGS: Endotracheal tube tip at the clavicular heads. An orogastric tube reaches the stomach. Left subclavian line with tip at the SVC. Extensive airspace disease on the left has mildly improved since yesterday. There is cardiomegaly with coronary stent. No pneumothorax. IMPRESSION: 1. Stable positioning of tubes and central line. 2. Extensive  airspace disease on the left with mild interval improvement. Electronically Signed   By: Monte Fantasia M.D.   On: 07/26/2017 11:28   ASSESSMENT / PLAN:  Acute Hypoxic respiratory failure- Left multilobar infiltrate/CAP after bronch raising suspicion for DAH Remains on Vent support , CXR w/ no sign change  PCT  >150 .  Plan Cont vent support  Wean PEEP/FiO2 as able  Tr PCT  Cont Vanc and Rocephin and Azithro  Follow cx data   Brief PEA arrest following intubation. Bradycardic rhythm.  -less than 3 minutes  NSTEMI? Troponin tr up 5/11.  H/o CAD and diastolic HF  -Shock resolved , off pressors  5/10 ; cardiology signed off does not feel like this was an acute non-ST elevation MI but more likely demand ischemia Plan Echo pending  Tr troponin   Continue telemetry monitoring KVO IV fluids Continue Lipitor and fenofibrate Cont aspirin and Plavix Consider call back to cards if once echo and tr troponin return    Chronic renal failure w/ acute on chronic renal failure  -good UOP  Scr stable at 3.03  .    baseline creatinine runs around 1.7-2.3 Plan Strict intake output Renal dose medications Follow-up a.m. chemistry  Acute encephalopathy.  This is new following intubation.  Suspect mostly metabolic and origin but did have brief less than 3-minute cardiac arrest Plan PAD protocol RASS goal -2 Anticipate discontinuing fentanyl infusion in the next 24 to 48 hours, continue Precedex, as she is not ready to wean currently we will hold infusions where she is at  At risk for fluid and electrolyte imbalance Plan Repeat a.m. Chemistry  Anemia of chronic disease No evidence of active bleeding currently Plan Trend CBC Transfuse for hemoglobin less than 7  Diabetes with hyperglycemia Blood glucoses exceeding 200 Plan Increase Levemir 22units  Continue sliding scale insulin  Stage IA right invasive ductal breast carcinoma, ER/PR+, HER2- s/p R breast lumpectomy and XRT in  2014. Now on daily anastrozole 70m  Plan Continuing anastrozole follow-up outpatient    DVT prophylaxis:LMWH-->PAS, start subcu heparin SUP: PPI  Diet: tubefeed  Activity: BR Disposition : ICU   Alonza Knisley NP-C   Pulmonary and Critical Care  [phone]  07/27/2017

## 2017-07-27 NOTE — Progress Notes (Signed)
Callf rom daughter [name] - wishes for ID consult. Concern rising wbc but patient off pressors  And fever < 99.5. ABx changes explained. Lactate normal.   Ordered RVP daugther agreed to reassess in AM 07/28/17 after RVP resul;ts  Recent Labs  Lab 07/24/17 1444 07/24/17 1900 07/25/17 0520 07/26/17 0147 07/27/17 0626  LATICACIDVEN 7.0* 3.0*  --   --  1.7  PROCALCITON 130.72  --  >150.00 >150.00  --

## 2017-07-27 NOTE — Progress Notes (Signed)
  Echocardiogram 2D Echocardiogram has been performed.  [name] 07/27/2017, 10:52 AM

## 2017-07-27 NOTE — Progress Notes (Signed)
Pharmacy Antibiotic Note  [name] is a 72 y.o. female admitted on 07/23/2017 with progressive cough over the past 3 weeks. She continues on broad spectrum antibiotics. Renal function is poor but is stable. WBC is increasing with Tmax of 100. Pt is also on ceftriaxone and azithromycin.   Plan: Continue vancomycin 1gm IV Q24H  F/u renal fxn, C&S, clinical status and trough at [facility] De-escalate therapy as able   Height: _0  (162.6 cm) Weight: 207 lb 7.3 oz (94.1 kg) IBW/kg (Calculated) : 54.7  Temp (24hrs), Avg:98.7 F (37.1 C), Min:98.5 F (36.9 C), Max:99.1 F (37.3 C)  Recent Labs  Lab 07/24/17 0805 07/24/17 1444 07/24/17 1900 07/25/17 0520 07/25/17 1340 07/26/17 0147 07/27/17 0626  WBC 2.7*  --   --  9.5 12.7* 18.4* 24.7*  CREATININE 2.77* 3.08*  --  2.63*  --  3.04* 3.03*  LATICACIDVEN 3.0* 7.0* 3.0*  --   --   --  1.7      Allergies  Allergen Reactions  . Percocet [Oxycodone-Acetaminophen] Other (See Comments)    headaches  . Diazepam Other (See Comments)    REACTION: Agitation  . Haloperidol Lactate Nausea And Vomiting  . Lorazepam Nausea Only  . Morphine Sulfate Other (See Comments)    REACTION: Agitation  . Propoxyphene Hcl Nausea And Vomiting  . Tramadol Hcl Nausea And Vomiting    Antimicrobials this admission: 5/7 vancomycin > 5/8 azithromycin > 5/8 vancomycin >  Dose adjustments this admission: N/A  Microbiology results: 5/8 blood cx: ngtd 5/8 mrsa pcr: neg 5/8 pcp: neg 5/8 bal: NEG  Sahand Gosch, Rande Lawman 07/27/2017 12:19 PM

## 2017-07-28 ENCOUNTER — Inpatient Hospital Stay (HOSPITAL_COMMUNITY): Payer: Medicare Other

## 2017-07-28 DIAGNOSIS — I2510 Atherosclerotic heart disease of native coronary artery without angina pectoris: Secondary | ICD-10-CM

## 2017-07-28 DIAGNOSIS — R509 Fever, unspecified: Secondary | ICD-10-CM

## 2017-07-28 DIAGNOSIS — Z885 Allergy status to narcotic agent status: Secondary | ICD-10-CM

## 2017-07-28 DIAGNOSIS — Z888 Allergy status to other drugs, medicaments and biological substances status: Secondary | ICD-10-CM

## 2017-07-28 DIAGNOSIS — C50911 Malignant neoplasm of unspecified site of right female breast: Secondary | ICD-10-CM

## 2017-07-28 DIAGNOSIS — Z955 Presence of coronary angioplasty implant and graft: Secondary | ICD-10-CM

## 2017-07-28 DIAGNOSIS — Z9911 Dependence on respirator [ventilator] status: Secondary | ICD-10-CM

## 2017-07-28 DIAGNOSIS — J181 Lobar pneumonia, unspecified organism: Secondary | ICD-10-CM

## 2017-07-28 DIAGNOSIS — Z803 Family history of malignant neoplasm of breast: Secondary | ICD-10-CM

## 2017-07-28 LAB — CBC
HCT: 21.5 % — ABNORMAL LOW (ref 36.0–46.0)
HEMOGLOBIN: 6.8 g/dL — AB (ref 12.0–15.0)
MCH: 24.8 pg — ABNORMAL LOW (ref 26.0–34.0)
MCHC: 31.6 g/dL (ref 30.0–36.0)
MCV: 78.5 fL (ref 78.0–100.0)
PLATELETS: 228 10*3/uL (ref 150–400)
RBC: 2.74 MIL/uL — ABNORMAL LOW (ref 3.87–5.11)
RDW: 17 % — AB (ref 11.5–15.5)
WBC: 29.5 10*3/uL — ABNORMAL HIGH (ref 4.0–10.5)

## 2017-07-28 LAB — RESPIRATORY PANEL BY PCR
ADENOVIRUS-RVPPCR: NOT DETECTED
Bordetella pertussis: NOT DETECTED
CORONAVIRUS 229E-RVPPCR: NOT DETECTED
CORONAVIRUS NL63-RVPPCR: NOT DETECTED
CORONAVIRUS OC43-RVPPCR: NOT DETECTED
Chlamydophila pneumoniae: NOT DETECTED
Coronavirus HKU1: NOT DETECTED
INFLUENZA A-RVPPCR: NOT DETECTED
Influenza B: NOT DETECTED
METAPNEUMOVIRUS-RVPPCR: NOT DETECTED
Mycoplasma pneumoniae: NOT DETECTED
PARAINFLUENZA VIRUS 1-RVPPCR: NOT DETECTED
PARAINFLUENZA VIRUS 3-RVPPCR: NOT DETECTED
Parainfluenza Virus 2: NOT DETECTED
Parainfluenza Virus 4: NOT DETECTED
RHINOVIRUS / ENTEROVIRUS - RVPPCR: NOT DETECTED
Respiratory Syncytial Virus: NOT DETECTED

## 2017-07-28 LAB — POCT I-STAT 3, ART BLOOD GAS (G3+)
ACID-BASE EXCESS: 1 mmol/L (ref 0.0–2.0)
BICARBONATE: 25.7 mmol/L (ref 20.0–28.0)
O2 SAT: 88 %
PCO2 ART: 41.1 mmHg (ref 32.0–48.0)
PH ART: 7.406 (ref 7.350–7.450)
PO2 ART: 56 mmHg — AB (ref 83.0–108.0)
Patient temperature: 99.4
TCO2: 27 mmol/L (ref 22–32)

## 2017-07-28 LAB — PROCALCITONIN: PROCALCITONIN: 55.66 ng/mL

## 2017-07-28 LAB — SEDIMENTATION RATE: Sed Rate: 140 mm/hr — ABNORMAL HIGH (ref 0–22)

## 2017-07-28 LAB — GLUCOSE, CAPILLARY
GLUCOSE-CAPILLARY: 217 mg/dL — AB (ref 65–99)
GLUCOSE-CAPILLARY: 212 mg/dL — AB (ref 65–99)
Glucose-Capillary: 208 mg/dL — ABNORMAL HIGH (ref 65–99)
Glucose-Capillary: 217 mg/dL — ABNORMAL HIGH (ref 65–99)
Glucose-Capillary: 223 mg/dL — ABNORMAL HIGH (ref 65–99)
Glucose-Capillary: 245 mg/dL — ABNORMAL HIGH (ref 65–99)

## 2017-07-28 LAB — CK TOTAL AND CKMB (NOT AT ARMC)
CK TOTAL: 1147 U/L — AB (ref 38–234)
CK, MB: 4.2 ng/mL (ref 0.5–5.0)
RELATIVE INDEX: 0.4 (ref 0.0–2.5)

## 2017-07-28 LAB — BASIC METABOLIC PANEL
Anion gap: 12 (ref 5–15)
BUN: 101 mg/dL — ABNORMAL HIGH (ref 6–20)
CALCIUM: 8.2 mg/dL — AB (ref 8.9–10.3)
CO2: 27 mmol/L (ref 22–32)
CREATININE: 2.37 mg/dL — AB (ref 0.44–1.00)
Chloride: 108 mmol/L (ref 101–111)
GFR calc Af Amer: 23 mL/min — ABNORMAL LOW (ref >60)
GFR, EST NON AFRICAN AMERICAN: 20 mL/min — AB (ref >60)
Glucose, Bld: 226 mg/dL — ABNORMAL HIGH (ref 65–99)
Potassium: 4.1 mmol/L (ref 3.5–5.1)
SODIUM: 147 mmol/L — AB (ref 135–145)

## 2017-07-28 LAB — LACTIC ACID, PLASMA: LACTIC ACID, VENOUS: 1.8 mmol/L (ref 0.5–1.9)

## 2017-07-28 LAB — PREPARE RBC (CROSSMATCH)

## 2017-07-28 LAB — HEMOGLOBIN AND HEMATOCRIT, BLOOD
HCT: 26 % — ABNORMAL LOW (ref 36.0–46.0)
Hemoglobin: 8.4 g/dL — ABNORMAL LOW (ref 12.0–15.0)

## 2017-07-28 LAB — PHOSPHORUS: PHOSPHORUS: 2.9 mg/dL (ref 2.5–4.6)

## 2017-07-28 LAB — MAGNESIUM: MAGNESIUM: 2.1 mg/dL (ref 1.7–2.4)

## 2017-07-28 MED ORDER — SODIUM CHLORIDE 0.9 % IV SOLN
1.0000 g | Freq: Two times a day (BID) | INTRAVENOUS | Status: DC
  Administered 2017-07-28 – 2017-07-30 (×5): 1 g via INTRAVENOUS
  Filled 2017-07-28 (×5): qty 1

## 2017-07-28 MED ORDER — FUROSEMIDE 10 MG/ML IJ SOLN
40.0000 mg | Freq: Three times a day (TID) | INTRAMUSCULAR | Status: DC
  Administered 2017-07-28 – 2017-07-29 (×3): 40 mg via INTRAVENOUS
  Filled 2017-07-28 (×3): qty 4

## 2017-07-28 MED ORDER — MIDAZOLAM HCL 2 MG/2ML IJ SOLN
1.0000 mg | INTRAMUSCULAR | Status: DC | PRN
  Administered 2017-07-28: 1 mg via INTRAVENOUS
  Filled 2017-07-28 (×2): qty 2

## 2017-07-28 MED ORDER — FUROSEMIDE 10 MG/ML IJ SOLN
40.0000 mg | Freq: Once | INTRAMUSCULAR | Status: AC
  Administered 2017-07-28: 40 mg via INTRAVENOUS
  Filled 2017-07-28: qty 4

## 2017-07-28 MED ORDER — VANCOMYCIN HCL IN DEXTROSE 1-5 GM/200ML-% IV SOLN
1000.0000 mg | INTRAVENOUS | Status: DC
  Filled 2017-07-28: qty 200

## 2017-07-28 MED ORDER — SODIUM CHLORIDE 0.9 % IV SOLN
Freq: Once | INTRAVENOUS | Status: AC
  Administered 2017-07-28: 10:00:00 via INTRAVENOUS

## 2017-07-28 MED ORDER — FREE WATER
100.0000 mL | Freq: Three times a day (TID) | Status: DC
  Administered 2017-07-28 – 2017-07-29 (×4): 100 mL

## 2017-07-28 MED ORDER — MIDAZOLAM HCL 2 MG/2ML IJ SOLN
1.0000 mg | INTRAMUSCULAR | Status: DC | PRN
  Administered 2017-07-28 – 2017-07-29 (×3): 1 mg via INTRAVENOUS
  Filled 2017-07-28 (×3): qty 2

## 2017-07-28 MED ORDER — INSULIN DETEMIR 100 UNIT/ML ~~LOC~~ SOLN
32.0000 [IU] | Freq: Every day | SUBCUTANEOUS | Status: DC
  Administered 2017-07-28: 32 [IU] via SUBCUTANEOUS
  Filled 2017-07-28: qty 0.32

## 2017-07-28 MED ORDER — INSULIN DETEMIR 100 UNIT/ML ~~LOC~~ SOLN
24.0000 [IU] | Freq: Every day | SUBCUTANEOUS | Status: DC
  Filled 2017-07-28: qty 0.24

## 2017-07-28 MED ORDER — BISACODYL 10 MG RE SUPP
10.0000 mg | Freq: Every day | RECTAL | Status: DC | PRN
  Administered 2017-07-28: 10 mg via RECTAL
  Filled 2017-07-28: qty 1

## 2017-07-28 NOTE — Progress Notes (Signed)
 Name: [name] MRN: [id number] DOB: 07/26/1945    ADMISSION DATE:  07/23/2017 CONSULTATION DATE:  07/24/2017  REFERRING MD :  Dr. Klima IMTS  CHIEF COMPLAINT:  SOB  HISTORY OF PRESENT ILLNESS:  72 year old female with PMH as below, which is significant for Breast Ca (on anastrozole), CAD s/p DES, diastolic CHF, who was admitted to  5/7 with complaints of orthopnea and non-productive cough that have been progressive for 3 weeks. Acutely developed pleuritic chest pain, which prompted her to present to the clinic. In clinic she was hypoxic requiring nonrebeather to keep sats in the 90s, for which reason she was admitted. She was treated with CAP antibiotics, gentle IVF, and supplemental O2. Chest radiograph demonstrated large L sided infiltrate. She was felt to be volume overloaded on exam.  Despite Non-rebreather delivered O2, she remained hypotensive and somewhat hypotensive. She wast started on BiPAP and transferred to SDU. PCCM asked to see.   SIGNIFICANT EVENTS  5/7 admit 5/8 started on BiPAP and transferred to SDU. Brief junctional bradycardia w/ brief PEA arrest f/b cardiogenic & septic shock 3 pressor dependent >Intubated 5/10 Off pressors   STUDIES:   abx Azith 5/7>>> Rocephin 5/7>> Vanc 5/8>>>  Cultures BAL 5/8>>>NEG  BCX2 5/8>>> u strep 5/7: neg u legionella 5/7>>> Pneumocystis smear negative RVP 5/11 >neg   SUBJECTIVE:  More agitated overnight , increased wob /O2 demands .  sxn blood tinged mucus plug.  Progressive anemia s/p 1 U PRBC this am .  Low grade fevers  Remains off pressor support  Good UOP yesterday with lasix , remains ~4 L positive  Wbc tr up , CX remain neg to date , RVP neg , PCT decreased today 150 to 55 , LA 1.8 Kidney fxn improved    VITAL SIGNS: Temp:  [98.7 F (37.1 C)-100.7 F (38.2 C)] 100.7 F (38.2 C) (05/12 0718) Pulse Rate:  [78-97] 88 (05/12 0800) Resp:  [14-25] 23 (05/12 0800) BP: (117-151)/(51-105) 138/59 (05/12  0800) SpO2:  [89 %-97 %] 95 % (05/12 0800) Arterial Line BP: (143-183)/(47-63) 154/51 (05/11 1300) FiO2 (%):  [50 %-80 %] 70 % (05/12 0800) Weight:  [205 lb 14.6 oz (93.4 kg)] 205 lb 14.6 oz (93.4 kg) (05/12 0431)  Intake/Output Summary (Last 24 hours) at 07/28/2017 0813 Last data filed at 07/28/2017 0600 Gross per 24 hour  Intake 2236.92 ml  Output 3075 ml  Net -838.08 ml    PHYSICAL EXAMINATION:  General:  72-year-old female sedated on ventilator  HEENT: Normocephalic atraumatic intubated ET tube  Pulmonary: Scattered rhonchi decreased breath sounds on the left coarse left greater than right no accessory  Cardiac: Regular rate and rhythm without murmur rub or gallop Abdomen: Obese soft and nontender bowel sounds positive Extremities: Trace lower extremity edema  Neuro: Sedated on vent not following commands   Recent Labs  Lab 07/26/17 0147 07/27/17 0626 07/28/17 0430  NA 136 142 147*  K 4.6 4.2 4.1  CL 100* 104 108  CO2 23 25 27  BUN 83* 102* 101*  CREATININE 3.04* 3.03* 2.37*  GLUCOSE 277* 243* 226*   Recent Labs  Lab 07/26/17 0147 07/27/17 0626 07/28/17 0430  HGB 7.1* 7.1* 6.8*  HCT 22.4* 22.4* 21.5*  WBC 18.4* 24.7* 29.5*  PLT 291 256 228   Dg Chest Port 1 View  Result Date: 07/28/2017 CLINICAL DATA:  Pneumonia, coronary artery disease, history breast cancer, hypertension, lupus EXAM: PORTABLE CHEST 1 VIEW COMPARISON:  Portable exam 0654 hours compared   to 07/27/2017 FINDINGS: Tip of endotracheal tube projects 4.3 cm above carina. Nasogastric tube extends into stomach. LEFT subclavian central venous catheter tip projects over SVC. Upper normal heart size. Extensive airspace infiltrate throughout LEFT lung especially LEFT upper lobe. Mild RIGHT basilar atelectasis. Increasing infiltrate within RIGHT lung. No pleural effusion or pneumothorax. Surgical clips RIGHT axilla. Bones unremarkable. IMPRESSION: Persistent LEFT and increasing RIGHT pulmonary infiltrates. Mild  RIGHT basilar atelectasis. Electronically Signed   By: Mark  Boles M.D.   On: 07/28/2017 08:09   Dg Chest Port 1 View  Result Date: 07/27/2017 CLINICAL DATA:  Pneumonia. EXAM: PORTABLE CHEST 1 VIEW COMPARISON:  Jul 26, 2017 FINDINGS: The ETT and left central line are stable. The NG tube terminates below today's film. No pneumothorax. A diffuse opacity in the left lung is stable. No focal opacity on the right. Stable cardiomediastinal silhouette. IMPRESSION: 1. Support apparatus as above. 2. Persistent diffuse opacity throughout the left lung. Recommend follow-up to complete resolution. Electronically Signed   By: David  Williams III M.D   On: 07/27/2017 08:14   ASSESSMENT / PLAN:  Acute Hypoxic respiratory failure- Multilobar PNA L >R  BAL neg 5/8 , RVP neg , CX ntd , Leg/Strep neg   PCT tr down , WBC tr up  Blood tinged mucus plugging 5/11 , nothing further 5/12 .  Plan Cont vent support  Wean PEEP/FiO2 as able  Vanc/Rocephin /Azithr changed to Levaquin 5/11  Follow cx data Cont BD  Lasix 40mg x 1 , if not improving on cxr , consider bedside bronch in am +/- CT chest  Consider ID consult if not improving     Brief PEA arrest following intubation. Bradycardic rhythm.  -less than 3 minutes  NSTEMI? Troponin tr up 5/11. -echo ok except for PAP tr up  H/o CAD and diastolic HF  -Shock resolved , off pressors 5/10  5/10 ; cardiology signed off does not feel like this was an acute non-ST elevation MI but more likely demand ischemia Echo 5/11 >EF preserved, MV /TV mod regurg, , PAP 64mmHg  Plan  Continue telemetry monitoring KVO IV fluids Continue Lipitor and fenofibrate Cont aspirin and Plavix- no sign of active bleeding   Hep Sq for DVT prophylaxis  D/c Arimidex for now as increased risk of VTE , feel risk outweighs the benefit in setting of critical illnesss.     Chronic renal failure w/ acute on chronic renal failure    .    baseline creatinine runs around 1.7-2.3 5/12 scr tr  down , good UOP  Plan Strict intake output Renal dose medications Follow-up a.m. Chemistry   Acute encephalopathy.  This is new following intubation.  Suspect mostly metabolic and origin but did have brief less than 3-minute cardiac arrest Plan PAD protocol RASS goal -1 to 0  Cont fentanyl and precedex , wean as able    At risk for fluid and electrolyte imbalance Hypernatremia  Plan Repeat a.m. Chemistry Add free water 100 q8   Anemia of chronic disease  No evidence of active bleeding currently- minimal blood tinged mucus , isolated on 5/11 . None to trace on 5/12  This appears to be more of anemia of critical illness/chronic kidney dz  5/12 s/p 1 U PRBC  Plan Trend CBC Transfuse for hemoglobin less than 7 1 U PRBC 5/12   Diabetes with hyperglycemia Blood glucoses exceeding 200 Plan Increase Levemir 24units  Continue sliding scale insulin  Stage IA right invasive ductal breast carcinoma, ER/PR+, HER2- s/p   R breast lumpectomy and XRT in 2014. Now on daily anastrozole 1mg  Plan Continuing anastrozole follow-up outpatient    DVT prophylaxis:LMWH-->PAS, subcu heparin SUP: PPI  Diet: tubefeed  Activity: BR Disposition : ICU   Tammy Parrett NP-C  [place] [facility]  [phone]   07/28/2017  

## 2017-07-28 NOTE — Progress Notes (Signed)
eLink Physician-Brief Progress Note Patient Name: [name] DOB: 08/05/1945 MRN: [id number]   Date of Service  07/28/2017  HPI/Events of Note  Hyperglycemia - Blood glucose = 217 --> 212 --> 223 --> 217 --> 245.  eICU Interventions  Will order: 1. Increase 10 PM Levemir dose to 32 units.      Intervention Category Major Interventions: Hyperglycemia - active titration of insulin therapy  Lysle Dingwall 07/28/2017, 9:46 PM

## 2017-07-28 NOTE — Progress Notes (Signed)
Call from daughte -asking for HLA test to ddx TRALI v other cause of ARDS  Plan D/w daughtr - test probably unhelpful Updated ID input of abx change to merropenem Daughter intersested in Heme consult depending on progress   Dr. Brand Males, M.D., [facility].C.P Pulmonary and Critical Care Medicine Staff Physician, [place] Director - Interstitial Lung Disease  Program  Pulmonary [place] at [place], [place], [address]  Pager: [phone], If no answer or between  15:00h - 7:00h: call [phone] Telephone: [phone]

## 2017-07-28 NOTE — Progress Notes (Addendum)
Pharmacy Antibiotic Note  [name] is a 72 y.o. female admitted on 07/23/2017 with progressive cough over the past 3 weeks. Antibiotics were narrowed to levaquin yesterday but on adding back azithromycin. Pt with Tmax 101.7 and WBC is elevated at 29.5. Scr improved to 2.37 and PCT down to 55.66.   Plan: Restart vancomycin 1gm IV Q24H  F/u renal fxn, C&S, clinical status and trough at [facility]  Addendum: Changing antibiotics to meropenem per ID recommendations. Will start meropenem 1gm IV Q12H   Height: _0  (162.6 cm) Weight: 205 lb 14.6 oz (93.4 kg) IBW/kg (Calculated) : 54.7  Temp (24hrs), Avg:100.2 F (37.9 C), Min:98.7 F (37.1 C), Max:101.7 F (38.7 C)  Recent Labs  Lab 07/24/17 0805 07/24/17 1444 07/24/17 1900 07/25/17 0520 07/25/17 1340 07/26/17 0147 07/27/17 0626 07/28/17 0430  WBC 2.7*  --   --  9.5 12.7* 18.4* 24.7* 29.5*  CREATININE 2.77* 3.08*  --  2.63*  --  3.04* 3.03* 2.37*  LATICACIDVEN 3.0* 7.0* 3.0*  --   --   --  1.7 1.8      Allergies  Allergen Reactions  . Percocet [Oxycodone-Acetaminophen] Other (See Comments)    headaches  . Diazepam Other (See Comments)    REACTION: Agitation  . Haloperidol Lactate Nausea And Vomiting  . Lorazepam Nausea Only  . Morphine Sulfate Other (See Comments)    REACTION: Agitation  . Propoxyphene Hcl Nausea And Vomiting  . Tramadol Hcl Nausea And Vomiting    Antimicrobials this admission:  5/7 ceftriaxone > 5/8 azithromycin > 5/8 Vanc>>5/11; 5/12>> Levaquin 5/11>>  Dose adjustments this admission: N/A  Microbiology results: 5/8 blood cx: ngtd 5/8 mrsa pcr: neg 5/8 pcp: neg 5/8 bal: NEG  Ramandeep Arington, Rande Lawman 07/28/2017 11:50 AM

## 2017-07-28 NOTE — Progress Notes (Signed)
On assessment, pt RR 40's, SpO2 88%, visibly agitated and flailing limbs towards ETT. Max doses of sedation, obtained 1 mg versed. Daughter claimed pt is not agitated and does not need to be over sedated. Pt RASS is +3. Educated family at bedside on necessity of sedation for adequate ventilation/oxygenation, gave 1 mg versed. Pt calmed, RR 20's, SpO2 96% after suctioning large bloody mucus plug from ETT. Discussed plan of care with family at bedside. VSS. Will continue to monitor.

## 2017-07-28 NOTE — Progress Notes (Signed)
Progress Note  Patient Name: [name] Date of Encounter: 07/28/2017  Primary Cardiologist: Johnsie Cancel   Subjective   72 yo with hx of CAd.  S/p rotoblator in 2000. Admitted with respiratory failure. We were asked to see her again because of evidence of pulmonary hemorrhage .   She is still on DAPT.     Inpatient Medications    Scheduled Meds: . aspirin  81 mg Per Tube Daily  . atorvastatin  40 mg Oral QHS  . chlorhexidine gluconate (MEDLINE KIT)  15 mL Mouth Rinse BID  . clopidogrel  75 mg Per Tube Daily  . feeding supplement (PRO-STAT SUGAR FREE 64)  30 mL Per Tube Daily  . free water  100 mL Per Tube Q8H  . furosemide  40 mg Intravenous Q8H  . heparin injection (subcutaneous)  5,000 Units Subcutaneous Q8H  . insulin aspart  0-20 Units Subcutaneous Q4H  . insulin detemir  24 Units Subcutaneous QHS  . ipratropium-albuterol  3 mL Nebulization Q4H  . mouth rinse  15 mL Mouth Rinse 10 times per day  . polyethylene glycol  17 g Oral Daily   Continuous Infusions: . sodium chloride    . dexmedetomidine (PRECEDEX) IV infusion 1.2 mcg/kg/hr (07/28/17 1132)  . famotidine (PEPCID) IV Stopped (07/27/17 1409)  . feeding supplement (VITAL HIGH PROTEIN) 45 mL/hr at 07/28/17 0600  . fentaNYL infusion INTRAVENOUS 400 mcg/hr (07/28/17 0958)  . levofloxacin (LEVAQUIN) IV Stopped (07/27/17 2319)  . vancomycin     PRN Meds: acetaminophen **OR** acetaminophen, albuterol, fentaNYL, midazolam, midazolam   Vital Signs    Vitals:   07/28/17 1007 07/28/17 1015 07/28/17 1100 07/28/17 1140  BP: (!) 153/66  (!) 151/60   Pulse: 96  93   Resp: (!) 26  (!) 28   Temp: (!) 101.7 F (38.7 C)   (!) 101.4 F (38.6 C)  TempSrc: Oral   Oral  SpO2: 93% (!) 55% 94%   Weight:      Height:        Intake/Output Summary (Last 24 hours) at 07/28/2017 1151 Last data filed at 07/28/2017 1132 Gross per 24 hour  Intake 2578 ml  Output 3775 ml  Net -1197 ml   Filed Weights   07/26/17 0340 07/27/17  0500 07/28/17 0431  Weight: 203 lb 11.3 oz (92.4 kg) 207 lb 7.3 oz (94.1 kg) 205 lb 14.6 oz (93.4 kg)    Telemetry     NSR at 97 - Personally Reviewed  ECG    NSR  - Personally Reviewed  Physical Exam   GEN:  elderly female,  On the vent , sedated  Neck: No JVD Cardiac: RRR,  Soft systolic murmur   Respiratory:  On the vent  GI: Soft, nontender, non-distended  MS: No edema; No deformity. Neuro:  sedated, unable to assess  Psych: sedated  Labs    Chemistry Recent Labs  Lab 07/24/17 1444 07/25/17 0520 07/26/17 0147 07/27/17 0626 07/28/17 0430  NA 141 141 136 142 147*  K 4.1 3.3* 4.6 4.2 4.1  CL 106 104 100* 104 108  CO2 18* 25 23 25 27   GLUCOSE 98 289* 277* 243* 226*  BUN 66* 65* 83* 102* 101*  CREATININE 3.08* 2.63* 3.04* 3.03* 2.37*  CALCIUM 8.1* 7.6* 7.4* 7.7* 8.2*  PROT 6.4* 5.9*  --  6.9  --   ALBUMIN 2.3* 2.1*  --  1.9*  --   AST 145* 94*  --  68*  --   ALT  47 52  --  52  --   ALKPHOS 69 81  --  245*  --   BILITOT 0.9 1.4*  --  0.6  --   GFRNONAA 14* 17* 15* 15* 20*  GFRAA 17* 20* 17* 17* 23*  ANIONGAP 17* 12 13 13 12      Hematology Recent Labs  Lab 07/26/17 0147 07/27/17 0626 07/28/17 0430  WBC 18.4* 24.7* 29.5*  RBC 2.85* 2.85* 2.74*  HGB 7.1* 7.1* 6.8*  HCT 22.4* 22.4* 21.5*  MCV 78.6 78.6 78.5  MCH 24.9* 24.9* 24.8*  MCHC 31.7 31.7 31.6  RDW 16.3* 16.9* 17.0*  PLT 291 256 228    Cardiac Enzymes Recent Labs  Lab 07/24/17 1444 07/24/17 1900 07/25/17 0520 07/27/17 0626  TROPONINI 0.03* 0.14* 0.17* 1.96*   No results for input(s): TROPIPOC in the last 168 hours.   BNP Recent Labs  Lab 07/23/17 1214  BNP 176.4*     DDimer No results for input(s): DDIMER in the last 168 hours.   Radiology    Dg Chest Port 1 View  Result Date: 07/28/2017 CLINICAL DATA:  Pneumonia, coronary artery disease, history breast cancer, hypertension, lupus EXAM: PORTABLE CHEST 1 VIEW COMPARISON:  Portable exam 0654 hours compared to 07/27/2017  FINDINGS: Tip of endotracheal tube projects 4.3 cm above carina. Nasogastric tube extends into stomach. LEFT subclavian central venous catheter tip projects over SVC. Upper normal heart size. Extensive airspace infiltrate throughout LEFT lung especially LEFT upper lobe. Mild RIGHT basilar atelectasis. Increasing infiltrate within RIGHT lung. No pleural effusion or pneumothorax. Surgical clips RIGHT axilla. Bones unremarkable. IMPRESSION: Persistent LEFT and increasing RIGHT pulmonary infiltrates. Mild RIGHT basilar atelectasis. Electronically Signed   By: Lavonia Dana M.D.   On: 07/28/2017 08:09   Dg Chest Port 1 View  Result Date: 07/27/2017 CLINICAL DATA:  Pneumonia. EXAM: PORTABLE CHEST 1 VIEW COMPARISON:  Jul 26, 2017 FINDINGS: The ETT and left central line are stable. The NG tube terminates below today's film. No pneumothorax. A diffuse opacity in the left lung is stable. No focal opacity on the right. Stable cardiomediastinal silhouette. IMPRESSION: 1. Support apparatus as above. 2. Persistent diffuse opacity throughout the left lung. Recommend follow-up to complete resolution. Electronically Signed   By: Dorise Bullion III M.D   On: 07/27/2017 08:14    Cardiac Studies     Patient Profile     72 y.o. female with hx of CAD, s/p remote PCI, admitted with respiratory failure  Assessment & Plan    1. Acute respiratory failure:   Associated with pulmonary hemorrhage. Agree with holding ASA and plavix for now. We can restart once her ARDS resolves.   2.  CAD :  No recent angina ;     For questions or updates, please contact [place] Please consult [email or web address] for contact info under Cardiology/STEMI.      Signed, Mertie Moores, MD  07/28/2017, 11:51 AM   d

## 2017-07-28 NOTE — Progress Notes (Signed)
eLink Physician-Brief Progress Note Patient Name: [name] DOB: 1945/12/07 MRN: [id number]   Date of Service  07/28/2017  HPI/Events of Note  Hgb drop from 7.1 to 6.8  eICU Interventions  Transfuse 1 unit pRBC Post-transfusion CBC     Intervention Category Intermediate Interventions: Other:  DETERDING,[name] 07/28/2017, 5:50 AM

## 2017-07-28 NOTE — Progress Notes (Signed)
CRITICAL VALUE ALERT  Critical Value:  Hemoglobin 6.8  Date & Time Notied:  07/28/17 0545  Provider Notified: Warren Lacy  Orders Received/Actions taken: see new orders

## 2017-07-28 NOTE — Progress Notes (Signed)
Pt significantly agitated, decrease in SpO2 80's, RR 40's, pt attempting to remove ETT and exit the bed, max sedation settings. Notified ELink MD, gave 1 mg versed. Pt sedated, SpO2 did not increase. RT removed large blood-tinged mucous plug and obtained ABG. Results called to MD, vent settings increased to 80%, 10 PEEP. Pt currently calm and resting, vent compliant, SpO2 96%. Will continue to monitor.

## 2017-07-28 NOTE — Consult Note (Signed)
  [facility]       Reason for Consult: fever, pneumonia    Referring Physician: Dr. Ramaswamy  Active Problems:   SOB (shortness of breath)   Chest pain   Pneumonia of left lung due to infectious organism   Acute respiratory failure with hypoxemia (HCC)   Cardiac arrest, cause unspecified (HCC)   Shock circulatory (HCC)   Septic shock (HCC)   HCAP (healthcare-associated pneumonia)   Diffuse pulmonary alveolar hemorrhage   . atorvastatin  40 mg Oral QHS  . chlorhexidine gluconate (MEDLINE KIT)  15 mL Mouth Rinse BID  . feeding supplement (PRO-STAT SUGAR FREE 64)  30 mL Per Tube Daily  . free water  100 mL Per Tube Q8H  . furosemide  40 mg Intravenous Q8H  . heparin injection (subcutaneous)  5,000 Units Subcutaneous Q8H  . insulin aspart  0-20 Units Subcutaneous Q4H  . insulin detemir  24 Units Subcutaneous QHS  . ipratropium-albuterol  3 mL Nebulization Q4H  . mouth rinse  15 mL Mouth Rinse 10 times per day  . polyethylene glycol  17 g Oral Daily    Recommendations: Change to meropenem Stop vancomycin and levaquin Re culture as ordered   Assessment: She presented with pneumonia with respiratory distress with cough and noted large left-sided pneumonia and started on treatment for CAP.  She required sepsis and required pressor support but has been slow to improve.  WBC increasing with fever now up to 101.4 of unknown etiology.  Left side of chest with patchy infiltrates and opacity, personally read.  No positive cultures, RVP negative.  No clear etiology. I suspect mainly ARDS driving issues now.  Fever and increased WBC could be new nosocomial infection or aspiration after PEA arrest.    Dr. Hatcher will follow up tomorrow  Antibiotics: Azithromycin 4 days Ceftriaxone 3 days levaquin x 1  Vancomycin x 3 days  HPI: [name] is a 72 y.o. female with breast cancer, CAD and history of stent placement came with with acute chest pain,  respiratory distress and required intubation.  Her WBC has steadily increased and now up to 29.5, temperature to 101.  Some improvement in her PEEP down to 10.  Has remained off of pressors.  Developed alveolar hemorrhage and brief PEA arrest.  Daughter and sister at the bedside.     Review of Systems:  Unable to be assessed due to patient factors   Past Medical History:  Diagnosis Date  . Abdominal discomfort    Chronic N/V/D. Presumptive dx Crohn's dx per elevated p ANCA. Failed Entocort and Pentasa. Sep 2003 - ileocolectomy c anastomosis per Dr Bowman 2/2 adhesions - path was hegative for Crohns. EGD, Sm bowel follow through (11/03), and an eteroclysis (10/03) were unrevealing. Cuases hypomag and hypocalcemia.  . Adnexal mass 8/03   s/p lap BSO (R ovarian fibroma) & lysis of adhesions  . Allergy    Seasonal  . Anemia    Multifactorial. Baseline HgB 10-11 ish. B12 def - 150 in 3/10. Fe Def - ferritin 35 3/10. Both are being repleted.  . Breast cancer (HCC) 03/16/13   right, 5 o'clock  . CAD (coronary artery disease) 1996   1996 - PTCA and angioplasty diagonal branch. 2000 - Rotoblator & angiopllasty of diagonal. 2006 - subendocardial AMI, DES to proximal LAD.. Also had 90% stenosis in distal apical LAD. EF 55 with apical hypokinesis. Indefinite ASA and Plavix.  . CHF (congestive heart failure) (HCC)   .   Chronic kidney disease    Chronic renal insuff baseline Cr 1.2 - 1.4 ish.  . Chronic pain    CT 10/10 = Spinal stenosis L2 - S1.  . Diabetes mellitus    Insulin dependent  . Hx of radiation therapy 06/02/13- 07/16/13   right rbeast 4500 cGy 25 sessions, right breast boost 1600 cGy in 8 sessions  . Hyperlipidemia    Managed with both a statin and Welchol. Welchol stopped 2014 2/2 cost and started on fenofibrate   . Hypertension    2006 B renal arteries patent. 2003 MRA - no RAS. 2003 pheo W/U Dr Hassell Done reportedly negative.  . Hypoxia 07/23/2017  . Lupus ([place])   . Personal history of  radiation therapy   . RBBB   . Stroke [facility])    Incidental finding MRI 2002 L lacunar infarct  . Wears dentures    top    Social History   Tobacco Use  . Smoking status: Never Smoker  . Smokeless tobacco: Never Used  Substance Use Topics  . Alcohol use: No    Alcohol/week: 0.0 oz  . Drug use: No    Family History  Problem Relation Age of Onset  . Pancreatic cancer Brother 44  . Cancer Mother   . Breast cancer Mother 71  . Lung cancer Maternal Aunt   . Breast cancer Maternal Aunt   . Prostate cancer Maternal Uncle   . Heart attack Maternal Grandmother   . Breast cancer Maternal Grandmother   . Kidney failure Maternal Aunt   . Hypertension Daughter     Allergies  Allergen Reactions  . Percocet [Oxycodone-Acetaminophen] Other (See Comments)    headaches  . Diazepam Other (See Comments)    REACTION: Agitation  . Haloperidol Lactate Nausea And Vomiting  . Lorazepam Nausea Only  . Morphine Sulfate Other (See Comments)    REACTION: Agitation  . Propoxyphene Hcl Nausea And Vomiting  . Tramadol Hcl Nausea And Vomiting    Physical Exam: Constitutional: sedated on precedex, no response Vitals:   07/28/17 1204 07/28/17 1206  BP:  (!) 153/61  Pulse:  91  Resp:  (!) 27  Temp:    SpO2: 96% 96%   EYES: anicteric ENMT: +ET Cardiovascular: Cor Tachy Respiratory: CTA, anterior exam; respiratory effort on vent GI: Bowel sounds are normal, soft with some mild distension Musculoskeletal: no pedal edema noted Skin: negatives: no rash Hematologic: no cervical lad  Lab Results  Component Value Date   WBC 29.5 (H) 07/28/2017   HGB 6.8 (LL) 07/28/2017   HCT 21.5 (L) 07/28/2017   MCV 78.5 07/28/2017   PLT 228 07/28/2017    Lab Results  Component Value Date   CREATININE 2.37 (H) 07/28/2017   BUN 101 (H) 07/28/2017   NA 147 (H) 07/28/2017   K 4.1 07/28/2017   CL 108 07/28/2017   CO2 27 07/28/2017    Lab Results  Component Value Date   ALT 52 07/27/2017   AST 68  (H) 07/27/2017   ALKPHOS 245 (H) 07/27/2017     Microbiology: Recent Results (from the past 240 hour(s))  MRSA PCR Screening     Status: None   Collection Time: 07/24/17  1:38 AM  Result Value Ref Range Status   MRSA by PCR NEGATIVE NEGATIVE Final    Comment:        The GeneXpert MRSA Assay (FDA approved for NASAL specimens only), is one component of a comprehensive MRSA colonization surveillance program. It is not intended to  diagnose MRSA infection nor to guide or monitor treatment for MRSA infections. Performed at [facility], [address] [place], [place] [address]   Culture, blood (Routine X 2) w Reflex to ID Panel     Status: None (Preliminary result)   Collection Time: 07/24/17  6:00 AM  Result Value Ref Range Status   Specimen Description BLOOD LEFT ANTECUBITAL  Final   Special Requests   Final    BOTTLES DRAWN AEROBIC AND ANAEROBIC Blood Culture adequate volume   Culture   Final    NO GROWTH 4 DAYS Performed at [facility], [address] [place], [place] [address]    Report Status PENDING  Incomplete  Culture, blood (Routine X 2) w Reflex to ID Panel     Status: None (Preliminary result)   Collection Time: 07/24/17  6:20 AM  Result Value Ref Range Status   Specimen Description BLOOD LEFT FOREARM  Final   Special Requests   Final    BOTTLES DRAWN AEROBIC AND ANAEROBIC Blood Culture adequate volume   Culture   Final    NO GROWTH 4 DAYS Performed at [facility], [address] [place], [place] [address]    Report Status PENDING  Incomplete  Culture, bal-quantitative     Status: None   Collection Time: 07/24/17  1:54 PM  Result Value Ref Range Status   Specimen Description BRONCHIAL WASHINGS  Final   Special Requests Immunocompromised  Final   Gram Stain   Final    FEW WBC PRESENT, PREDOMINANTLY PMN NO ORGANISMS SEEN    Culture   Final    NO GROWTH 2 DAYS Performed at [facility], [address] [place], [place]  [address]    Report Status 07/26/2017 FINAL  Final  Pneumocystis smear by DFA     Status: None   Collection Time: 07/24/17  1:54 PM  Result Value Ref Range Status   Specimen Source-PJSRC BRONCHIAL WASHINGS  Final   Pneumocystis jiroveci Ag NEGATIVE  Final    Comment: Performed at [facility] Performed at [facility], [address] [place], [place] [address]   Respiratory Panel by PCR     Status: None   Collection Time: 07/27/17  2:37 PM  Result Value Ref Range Status   Adenovirus NOT DETECTED NOT DETECTED Final   Coronavirus 229E NOT DETECTED NOT DETECTED Final   Coronavirus HKU1 NOT DETECTED NOT DETECTED Final   Coronavirus NL63 NOT DETECTED NOT DETECTED Final   Coronavirus OC43 NOT DETECTED NOT DETECTED Final   Metapneumovirus NOT DETECTED NOT DETECTED Final   Rhinovirus / Enterovirus NOT DETECTED NOT DETECTED Final   Influenza A NOT DETECTED NOT DETECTED Corrected   Influenza B NOT DETECTED NOT DETECTED Final   Parainfluenza Virus 1 NOT DETECTED NOT DETECTED Final   Parainfluenza Virus 2 NOT DETECTED NOT DETECTED Final   Parainfluenza Virus 3 NOT DETECTED NOT DETECTED Final   Parainfluenza Virus 4 NOT DETECTED NOT DETECTED Final   Respiratory Syncytial Virus NOT DETECTED NOT DETECTED Final   Bordetella pertussis NOT DETECTED NOT DETECTED Final   Chlamydophila pneumoniae NOT DETECTED NOT DETECTED Final   Mycoplasma pneumoniae NOT DETECTED NOT DETECTED Final    Comment: Performed at [facility], [address] [place],  [address]     W , MD [facility] [email or web address] [phone] pager  [phone] cell 07/28/2017, 12:18 PM 

## 2017-07-29 ENCOUNTER — Inpatient Hospital Stay (HOSPITAL_COMMUNITY): Payer: Medicare Other

## 2017-07-29 DIAGNOSIS — R0489 Hemorrhage from other sites in respiratory passages: Secondary | ICD-10-CM

## 2017-07-29 LAB — POCT I-STAT 3, ART BLOOD GAS (G3+)
Acid-Base Excess: 9 mmol/L — ABNORMAL HIGH (ref 0.0–2.0)
Bicarbonate: 32.2 mmol/L — ABNORMAL HIGH (ref 20.0–28.0)
O2 SAT: 94 %
PCO2 ART: 40.9 mmHg (ref 32.0–48.0)
PO2 ART: 69 mmHg — AB (ref 83.0–108.0)
Patient temperature: 101
TCO2: 33 mmol/L — ABNORMAL HIGH (ref 22–32)
pH, Arterial: 7.509 — ABNORMAL HIGH (ref 7.350–7.450)

## 2017-07-29 LAB — TYPE AND SCREEN
ABO/RH(D): B NEG
ANTIBODY SCREEN: NEGATIVE
UNIT DIVISION: 0

## 2017-07-29 LAB — GLUCOSE, CAPILLARY
GLUCOSE-CAPILLARY: 243 mg/dL — AB (ref 65–99)
GLUCOSE-CAPILLARY: 257 mg/dL — AB (ref 65–99)
GLUCOSE-CAPILLARY: 172 mg/dL — AB (ref 65–99)
Glucose-Capillary: 188 mg/dL — ABNORMAL HIGH (ref 65–99)
Glucose-Capillary: 219 mg/dL — ABNORMAL HIGH (ref 65–99)
Glucose-Capillary: 231 mg/dL — ABNORMAL HIGH (ref 65–99)
Glucose-Capillary: 251 mg/dL — ABNORMAL HIGH (ref 65–99)

## 2017-07-29 LAB — PROCALCITONIN: Procalcitonin: 35.44 ng/mL

## 2017-07-29 LAB — MAGNESIUM: MAGNESIUM: 1.7 mg/dL (ref 1.7–2.4)

## 2017-07-29 LAB — BASIC METABOLIC PANEL
Anion gap: 11 (ref 5–15)
BUN: 91 mg/dL — AB (ref 6–20)
CHLORIDE: 106 mmol/L (ref 101–111)
CO2: 31 mmol/L (ref 22–32)
CREATININE: 1.97 mg/dL — AB (ref 0.44–1.00)
Calcium: 9.3 mg/dL (ref 8.9–10.3)
GFR calc Af Amer: 28 mL/min — ABNORMAL LOW (ref >60)
GFR calc non Af Amer: 25 mL/min — ABNORMAL LOW (ref >60)
Glucose, Bld: 248 mg/dL — ABNORMAL HIGH (ref 65–99)
POTASSIUM: 3.9 mmol/L (ref 3.5–5.1)
SODIUM: 148 mmol/L — AB (ref 135–145)

## 2017-07-29 LAB — ANCA TITERS

## 2017-07-29 LAB — CBC
HEMATOCRIT: 27 % — AB (ref 36.0–46.0)
HEMOGLOBIN: 8.8 g/dL — AB (ref 12.0–15.0)
MCH: 25.5 pg — AB (ref 26.0–34.0)
MCHC: 32.6 g/dL (ref 30.0–36.0)
MCV: 78.3 fL (ref 78.0–100.0)
Platelets: 210 10*3/uL (ref 150–400)
RBC: 3.45 MIL/uL — AB (ref 3.87–5.11)
RDW: 16.9 % — ABNORMAL HIGH (ref 11.5–15.5)
WBC: 32.9 10*3/uL — ABNORMAL HIGH (ref 4.0–10.5)

## 2017-07-29 LAB — GLOMERULAR BASEMENT MEMBRANE ANTIBODIES: GBM Ab: 5 units (ref 0–20)

## 2017-07-29 LAB — MPO/PR-3 (ANCA) ANTIBODIES

## 2017-07-29 LAB — CULTURE, BLOOD (ROUTINE X 2)
CULTURE: NO GROWTH
CULTURE: NO GROWTH
SPECIAL REQUESTS: ADEQUATE
SPECIAL REQUESTS: ADEQUATE

## 2017-07-29 LAB — SJOGRENS SYNDROME-A EXTRACTABLE NUCLEAR ANTIBODY: SSA (Ro) (ENA) Antibody, IgG: <0.2 AI (ref 0.0–0.9)

## 2017-07-29 LAB — BPAM RBC
Blood Product Expiration Date: 201906012359
ISSUE DATE / TIME: 201905120940
Unit Type and Rh: 1700

## 2017-07-29 LAB — SJOGRENS SYNDROME-B EXTRACTABLE NUCLEAR ANTIBODY: SSB (La) (ENA) Antibody, IgG: <0.2 AI (ref 0.0–0.9)

## 2017-07-29 LAB — URINE CULTURE: Culture: NO GROWTH

## 2017-07-29 LAB — ANTI-SCLERODERMA ANTIBODY: Scleroderma (Scl-70) (ENA) Antibody, IgG: 0.5 AI (ref 0.0–0.9)

## 2017-07-29 LAB — PHOSPHORUS: Phosphorus: 2.3 mg/dL — ABNORMAL LOW (ref 2.5–4.6)

## 2017-07-29 LAB — ANTI-DNA ANTIBODY, DOUBLE-STRANDED: DS DNA AB: 3 [IU]/mL (ref 0–9)

## 2017-07-29 LAB — ALDOLASE: Aldolase: 29.4 U/L — ABNORMAL HIGH (ref 3.3–10.3)

## 2017-07-29 LAB — HIV ANTIBODY (ROUTINE TESTING W REFLEX): HIV Screen 4th Generation wRfx: NONREACTIVE

## 2017-07-29 MED ORDER — POTASSIUM PHOSPHATES 15 MMOLE/5ML IV SOLN
30.0000 mmol | Freq: Once | INTRAVENOUS | Status: AC
  Administered 2017-07-29: 30 mmol via INTRAVENOUS
  Filled 2017-07-29: qty 10

## 2017-07-29 MED ORDER — FREE WATER
250.0000 mL | Freq: Four times a day (QID) | Status: DC
  Administered 2017-07-29 – 2017-07-31 (×2): 250 mL

## 2017-07-29 MED ORDER — POTASSIUM CHLORIDE 20 MEQ/15ML (10%) PO SOLN: 40.0000 meq | Freq: Three times a day (TID) | ORAL | Status: DC

## 2017-07-29 MED ORDER — FUROSEMIDE 10 MG/ML IJ SOLN
40.0000 mg | Freq: Three times a day (TID) | INTRAMUSCULAR | Status: AC
  Administered 2017-07-29 (×2): 40 mg via INTRAVENOUS
  Filled 2017-07-29 (×2): qty 4

## 2017-07-29 MED ORDER — METHYLNALTREXONE BROMIDE 12 MG/0.6ML ~~LOC~~ SOLN
12.0000 mg | Freq: Once | SUBCUTANEOUS | Status: AC
  Administered 2017-07-29: 12 mg via SUBCUTANEOUS
  Filled 2017-07-29 (×2): qty 0.6

## 2017-07-29 MED ORDER — FREE WATER: 200.0000 mL | Freq: Three times a day (TID) | Status: DC

## 2017-07-29 MED ORDER — MAGNESIUM SULFATE 2 GM/50ML IV SOLN
2.0000 g | Freq: Once | INTRAVENOUS | Status: AC
  Administered 2017-07-29: 2 g via INTRAVENOUS
  Filled 2017-07-29: qty 50

## 2017-07-29 MED ORDER — METOCLOPRAMIDE HCL 5 MG/ML IJ SOLN
5.0000 mg | Freq: Four times a day (QID) | INTRAMUSCULAR | Status: AC
  Administered 2017-07-29 – 2017-07-30 (×4): 5 mg via INTRAVENOUS
  Filled 2017-07-29 (×4): qty 2

## 2017-07-29 MED ORDER — MAGNESIUM SULFATE 2 GM/50ML IV SOLN
INTRAVENOUS | Status: AC
  Administered 2017-07-29: 2 g via INTRAVENOUS
  Filled 2017-07-29: qty 50

## 2017-07-29 MED ORDER — MAGNESIUM SULFATE 2 GM/50ML IV SOLN
2.0000 g | Freq: Once | INTRAVENOUS | Status: AC
  Administered 2017-07-29: 2 g via INTRAVENOUS

## 2017-07-29 NOTE — Progress Notes (Signed)
INFECTIOUS DISEASE PROGRESS NOTE  ID: [name] is a 72 y.o. female with  Active Problems:   SOB (shortness of breath)   Chest pain   Pneumonia of left lung due to infectious organism   Acute respiratory failure with hypoxemia (HCC)   Cardiac arrest, cause unspecified (HCC)   Shock circulatory (HCC)   Septic shock ([place])   HCAP (healthcare-associated pneumonia)   Diffuse pulmonary alveolar hemorrhage  Subjective: No distress.   Abtx:  Anti-infectives (From admission, onward)   Start     Dose/Rate Route Frequency Ordered Stop   07/28/17 1300  vancomycin (VANCOCIN) IVPB 1000 mg/200 mL premix  Status:  Discontinued     1,000 mg 200 mL/hr over 60 Minutes Intravenous Every 24 hours 07/28/17 1147 07/28/17 1218   07/28/17 1300  meropenem (MERREM) 1 g in sodium chloride 0.9 % 100 mL IVPB     1 g 200 mL/hr over 30 Minutes Intravenous Every 12 hours 07/28/17 1222     07/27/17 2200  levofloxacin (LEVAQUIN) IVPB 500 mg  Status:  Discontinued     500 mg 100 mL/hr over 60 Minutes Intravenous Every 48 hours 07/27/17 1342 07/28/17 1218   07/27/17 1800  vancomycin (VANCOCIN) IVPB 1000 mg/200 mL premix  Status:  Discontinued     1,000 mg 200 mL/hr over 60 Minutes Intravenous Every 24 hours 07/27/17 1232 07/27/17 1341   07/25/17 1700  vancomycin (VANCOCIN) IVPB 1000 mg/200 mL premix  Status:  Discontinued     1,000 mg 200 mL/hr over 60 Minutes Intravenous Every 24 hours 07/24/17 1425 07/27/17 1224   07/24/17 1500  vancomycin (VANCOCIN) 1,750 mg in sodium chloride 0.9 % 500 mL IVPB     1,750 mg 250 mL/hr over 120 Minutes Intravenous  Once 07/24/17 1351 07/24/17 1707   07/23/17 2200  azithromycin (ZITHROMAX) 500 mg in sodium chloride 0.9 % 250 mL IVPB  Status:  Discontinued     500 mg 250 mL/hr over 60 Minutes Intravenous Every 24 hours 07/23/17 1856 07/27/17 1341   07/23/17 1915  cefTRIAXone (ROCEPHIN) 2 g in sodium chloride 0.9 % 100 mL IVPB  Status:  Discontinued     2 g 200 mL/hr  over 30 Minutes Intravenous Every 24 hours 07/23/17 1914 07/27/17 1341      Medications:  Scheduled: . chlorhexidine gluconate (MEDLINE KIT)  15 mL Mouth Rinse BID  . heparin injection (subcutaneous)  5,000 Units Subcutaneous Q8H  . insulin aspart  0-20 Units Subcutaneous Q4H  . ipratropium-albuterol  3 mL Nebulization Q4H  . mouth rinse  15 mL Mouth Rinse 10 times per day    Objective: Vital signs in last 24 hours: Temp:  [99 F (37.2 C)-102.8 F (39.3 C)] 101.5 F (38.6 C) (05/13 0711) Pulse Rate:  [80-98] 94 (05/13 1100) Resp:  [17-31] 25 (05/13 1100) BP: (134-172)/(58-74) 158/63 (05/13 1100) SpO2:  [90 %-97 %] 94 % (05/13 1113) FiO2 (%):  [50 %-60 %] 50 % (05/13 1113) Weight:  [88.2 kg (194 lb 7.1 oz)] 88.2 kg (194 lb 7.1 oz) (05/13 0500)   General appearance: no distress Resp: clear to auscultation bilaterally Cardio: regular rate and rhythm GI: normal findings: bowel sounds normal and soft, non-tender and abnormal findings:  distended Extremities: edema none  Lab Results Recent Labs    07/28/17 0430 07/28/17 1501 07/29/17 0637  WBC 29.5*  --  32.9*  HGB 6.8* 8.4* 8.8*  HCT 21.5* 26.0* 27.0*  NA 147*  --  148*  K 4.1  --  3.9  CL 108  --  106  CO2 27  --  31  BUN 101*  --  91*  CREATININE 2.37*  --  1.97*   Liver Panel Recent Labs    07/27/17 0626  PROT 6.9  ALBUMIN 1.9*  AST 68*  ALT 52  ALKPHOS 245*  BILITOT 0.6   Sedimentation Rate Recent Labs    07/28/17 1501  ESRSEDRATE 140*   C-Reactive Protein No results for input(s): CRP in the last 72 hours.  Microbiology: Recent Results (from the past 240 hour(s))  MRSA PCR Screening     Status: None   Collection Time: 07/24/17  1:38 AM  Result Value Ref Range Status   MRSA by PCR NEGATIVE NEGATIVE Final    Comment:        The GeneXpert MRSA Assay (FDA approved for NASAL specimens only), is one component of a comprehensive MRSA colonization surveillance program. It is not intended to  diagnose MRSA infection nor to guide or monitor treatment for MRSA infections. Performed at [facility], [place] [address], [place], [place] [address]   Culture, blood (Routine X 2) w Reflex to ID Panel     Status: None (Preliminary result)   Collection Time: 07/24/17  6:00 AM  Result Value Ref Range Status   Specimen Description BLOOD LEFT ANTECUBITAL  Final   Special Requests   Final    BOTTLES DRAWN AEROBIC AND ANAEROBIC Blood Culture adequate volume   Culture   Final    NO GROWTH 4 DAYS Performed at [facility], [place] [address], [place], [place] [address]    Report Status PENDING  Incomplete  Culture, blood (Routine X 2) w Reflex to ID Panel     Status: None (Preliminary result)   Collection Time: 07/24/17  6:20 AM  Result Value Ref Range Status   Specimen Description BLOOD LEFT FOREARM  Final   Special Requests   Final    BOTTLES DRAWN AEROBIC AND ANAEROBIC Blood Culture adequate volume   Culture   Final    NO GROWTH 4 DAYS Performed at [facility], [place] [address] [place], [place] [address]    Report Status PENDING  Incomplete  Culture, bal-quantitative     Status: None   Collection Time: 07/24/17  1:54 PM  Result Value Ref Range Status   Specimen Description BRONCHIAL WASHINGS  Final   Special Requests Immunocompromised  Final   Gram Stain   Final    FEW WBC PRESENT, PREDOMINANTLY PMN NO ORGANISMS SEEN    Culture   Final    NO GROWTH 2 DAYS Performed at [facility], [place] [address] [place], [place] [address]    Report Status 07/26/2017 FINAL  Final  Pneumocystis smear by DFA     Status: None   Collection Time: 07/24/17  1:54 PM  Result Value Ref Range Status   Specimen Source-[facility] BRONCHIAL WASHINGS  Final   Pneumocystis jiroveci Ag NEGATIVE  Final    Comment: Performed at [facility] Performed at [facility], [address], [place], [place] [address]   Respiratory Panel by PCR     Status: None   Collection Time:  07/27/17  2:37 PM  Result Value Ref Range Status   Adenovirus NOT DETECTED NOT DETECTED Final   Coronavirus 229E NOT DETECTED NOT DETECTED Final   Coronavirus HKU1 NOT DETECTED NOT DETECTED Final   Coronavirus NL63 NOT DETECTED NOT DETECTED Final   Coronavirus  OC43 NOT DETECTED NOT DETECTED Final   Metapneumovirus NOT DETECTED NOT DETECTED Final   Rhinovirus / Enterovirus NOT DETECTED NOT DETECTED Final   Influenza A NOT DETECTED NOT DETECTED Corrected   Influenza B NOT DETECTED NOT DETECTED Final   Parainfluenza Virus 1 NOT DETECTED NOT DETECTED Final   Parainfluenza Virus 2 NOT DETECTED NOT DETECTED Final   Parainfluenza Virus 3 NOT DETECTED NOT DETECTED Final   Parainfluenza Virus 4 NOT DETECTED NOT DETECTED Final   Respiratory Syncytial Virus NOT DETECTED NOT DETECTED Final   Bordetella pertussis NOT DETECTED NOT DETECTED Final   Chlamydophila pneumoniae NOT DETECTED NOT DETECTED Final   Mycoplasma pneumoniae NOT DETECTED NOT DETECTED Final    Comment: Performed at [facility], [place] [address] [place], [place] [address]  Culture, respiratory (NON-Expectorated)     Status: None (Preliminary result)   Collection Time: 07/28/17 11:26 AM  Result Value Ref Range Status   Specimen Description TRACHEAL ASPIRATE  Final   Special Requests NONE  Final   Gram Stain   Final    ABUNDANT WBC PRESENT,BOTH PMN AND MONONUCLEAR NO SQUAMOUS EPITHELIAL CELLS SEEN NO ORGANISMS SEEN    Culture   Final    CULTURE REINCUBATED FOR BETTER GROWTH Performed at [facility], [place] [address] [place], [place] [address]    Report Status PENDING  Incomplete    Studies/Results: Dg Abd 1 View  Result Date: 07/29/2017 CLINICAL DATA:  Abdominal distension. EXAM: ABDOMEN - 1 VIEW COMPARISON:  07/20/2016 FINDINGS: Dilated small bowel loops are again noted compatible with small bowel obstruction. NG tube tip is in the distal stomach or proximal duodenum. Prior cholecystectomy. Overall bowel  dilatation has increased since prior study. No free air. IMPRESSION: NG tube tip in the distal stomach or proximal duodenum. Worsening small bowel distention compatible with small bowel obstruction. Electronically Signed   By: Rolm Baptise M.D.   On: 07/29/2017 10:27   Dg Chest Port 1 View  Result Date: 07/29/2017 CLINICAL DATA:  Follow-up pneumonia EXAM: PORTABLE CHEST 1 VIEW COMPARISON:  07/28/2017 FINDINGS: Endotracheal tube, nasogastric catheter and left subclavian central line are again seen and stable. Diffuse infiltrative changes noted throughout the left lung with mild basilar changes on the right. The overall appearance is stable from the prior study. No new focal abnormality is noted. IMPRESSION: No significant interval change from the previous day. Electronically Signed   By: Inez Catalina M.D.   On: 07/29/2017 07:08   Dg Chest Port 1 View  Result Date: 07/28/2017 CLINICAL DATA:  Pneumonia, coronary artery disease, history breast cancer, hypertension, lupus EXAM: PORTABLE CHEST 1 VIEW COMPARISON:  Portable exam 0654 hours compared to 07/27/2017 FINDINGS: Tip of endotracheal tube projects 4.3 cm above carina. Nasogastric tube extends into stomach. LEFT subclavian central venous catheter tip projects over SVC. Upper normal heart size. Extensive airspace infiltrate throughout LEFT lung especially LEFT upper lobe. Mild RIGHT basilar atelectasis. Increasing infiltrate within RIGHT lung. No pleural effusion or pneumothorax. Surgical clips RIGHT axilla. Bones unremarkable. IMPRESSION: Persistent LEFT and increasing RIGHT pulmonary infiltrates. Mild RIGHT basilar atelectasis. Electronically Signed   By: Lavonia Dana M.D.   On: 07/28/2017 08:09     Assessment/Plan: CAP Aspiration, PEA arrest Alveolar hemmorrhage Fever SBO  Total days of antibiotics: 5 Merrem day 2   CXR unchanged, persistent fever Her BAL Cx was (-) Resp Cx pending. Not sure of significance of this with BAL (-) Her WBC is  increasing.  SpO2 improved Suspect  some of this is driven by SBO.  Spoke with daughter, I do not believe this is transfusion reaction (although possible).          Bobby Rumpf MD, FACP Infectious Diseases (pager) [phone] [email or web address] 07/29/2017, 11:15 AM  LOS: 5 days

## 2017-07-29 NOTE — Progress Notes (Signed)
Dr Nelda Marseille paged for 6 beat run of VT. Pt condition stable and asympptomatic. Orders received for magnesium supplementation

## 2017-07-29 NOTE — Progress Notes (Addendum)
Name: [name] MRN: [id number] DOB: December 21, 1945    ADMISSION DATE:  07/23/2017 CONSULTATION DATE:  07/24/2017  REFERRING MD :  Dr. Ivor Costa  CHIEF COMPLAINT:  SOB  HISTORY OF PRESENT ILLNESS:  72 year old female with PMH as below, which is significant for Breast Ca (on anastrozole), CAD s/p DES, diastolic CHF, who was admitted to [facility] 5/7 with complaints of orthopnea and non-productive cough that have been progressive for 3 weeks. Acutely developed pleuritic chest pain, which prompted her to present to the clinic. In clinic she was hypoxic requiring nonrebeather to keep sats in the 90s, for which reason she was admitted. She was treated with CAP antibiotics, gentle IVF, and supplemental O2. Chest radiograph demonstrated large L sided infiltrate. She was felt to be volume overloaded on exam.  Despite Non-rebreather delivered O2, she remained hypotensive and somewhat hypotensive. She wast started on BiPAP and transferred to SDU. PCCM asked to see.   SIGNIFICANT EVENTS  5/7 admit 5/8 started on BiPAP and transferred to SDU. Brief junctional bradycardia w/ brief PEA arrest f/b cardiogenic & septic shock 3 pressor dependent >Intubated 5/10 Off pressors    STUDIES:   Antibiotics  Azith 5/7 >> 5/10 Rocephin 5/7 >> 5/10 Vanc 5/8 >> Levaquin 5/11 x 1 Meropenum 5/12 >>  Cultures MRSA 5/8 >> neg BAL 5/8>>>NEG  BCX2 5/8>>> ngtd u strep 5/7 >> neg u legionella 5/7 >> Pneumocystis smear negative RVP 5/11 >> neg  Trach asp 5/12 >> BC x 2 5/12 >> UC 5/12 >>   SUBJECTIVE:  Blood tinged mucous plug last PM Tmax 101.5/ WBC continues to elevate PCT decreasing 150 > 55 > 35.44 Improving vent requirements, non 50% and 10 peep Remains on precedex and fentanyl  Diuresed 5.3 L/ 11 lbs yest, remains +2.2 L Improving renal function Hgb stable/ improved No BM in 7+ days despite miralax and dulcolax    VITAL SIGNS: Temp:  [99 F (37.2 C)-102.8 F (39.3 C)] 101.5 F (38.6 C)  (05/13 0711) Pulse Rate:  [80-98] 93 (05/13 0900) Resp:  [17-31] 22 (05/13 0900) BP: (134-172)/(54-74) 163/67 (05/13 0900) SpO2:  [55 %-97 %] 91 % (05/13 0900) FiO2 (%):  [50 %-60 %] 50 % (05/13 0802) Weight:  [194 lb 7.1 oz (88.2 kg)] 194 lb 7.1 oz (88.2 kg) (05/13 0500)  Intake/Output Summary (Last 24 hours) at 07/29/2017 0925 Last data filed at 07/29/2017 0800 Gross per 24 hour  Intake 3374.8 ml  Output 5625 ml  Net -2250.2 ml    PHYSICAL EXAMINATION: General:  Critically ill adult female on MV in NAD HEENT: MM pink/dry; pinpoint pupils, ETT, OGT Neuro:  Opens eyes to voice and sticks out tongue on commands, unable to Madelina to commands, but some spont movement CV: s1s2 rrr, no m/r/g PULM: even/non-labored on MV, breathing over vent, lungs bilaterally coarse, scattered rhonchi/ diminished left base GI: distended, hyper BS  Extremities: warm/dry, trace edema  Skin: no rashes    Recent Labs  Lab 07/27/17 0626 07/28/17 0430 07/29/17 0637  NA 142 147* 148*  K 4.2 4.1 3.9  CL 104 108 106  CO2 _0 BUN 102* 101* 91*  CREATININE 3.03* 2.37* 1.97*  GLUCOSE 243* 226* 248*   Recent Labs  Lab 07/27/17 0626 07/28/17 0430 07/28/17 1501 07/29/17 0637  HGB 7.1* 6.8* 8.4* 8.8*  HCT 22.4* 21.5* 26.0* 27.0*  WBC 24.7* 29.5*  --  32.9*  PLT 256 228  --  210   Dg  Chest Port 1 View  Result Date: 07/29/2017 CLINICAL DATA:  Follow-up pneumonia EXAM: PORTABLE CHEST 1 VIEW COMPARISON:  07/28/2017 FINDINGS: Endotracheal tube, nasogastric catheter and left subclavian central line are again seen and stable. Diffuse infiltrative changes noted throughout the left lung with mild basilar changes on the right. The overall appearance is stable from the prior study. No new focal abnormality is noted. IMPRESSION: No significant interval change from the previous day. Electronically Signed   By: Inez Catalina M.D.   On: 07/29/2017 07:08   Dg Chest Port 1 View  Result Date: 07/28/2017 CLINICAL  DATA:  Pneumonia, coronary artery disease, history breast cancer, hypertension, lupus EXAM: PORTABLE CHEST 1 VIEW COMPARISON:  Portable exam 0654 hours compared to 07/27/2017 FINDINGS: Tip of endotracheal tube projects 4.3 cm above carina. Nasogastric tube extends into stomach. LEFT subclavian central venous catheter tip projects over SVC. Upper normal heart size. Extensive airspace infiltrate throughout LEFT lung especially LEFT upper lobe. Mild RIGHT basilar atelectasis. Increasing infiltrate within RIGHT lung. No pleural effusion or pneumothorax. Surgical clips RIGHT axilla. Bones unremarkable. IMPRESSION: Persistent LEFT and increasing RIGHT pulmonary infiltrates. Mild RIGHT basilar atelectasis. Electronically Signed   By: Lavonia Dana M.D.   On: 07/28/2017 08:09   ASSESSMENT / PLAN:  Acute Hypoxic respiratory failure- Multilobar PNA L >R  BAL neg 5/8 , RVP neg , CX ntd , Leg/Strep neg   PCT tr down , WBC tr up  Blood tinged mucus plugging 5/11 and PM of 5/12 Plan Cont vent support  ABG now, CXR in am Wean PEEP/FiO2 as able  Appreciate ID assistance, continue vanc and meropenum  Follow culture data, repeat pan cultured 5/12 Cont BD  Hold further lasix for now Autoimmune/ vasculitis/ HIV labs pending; sed rate 140   Brief PEA arrest following intubation. Bradycardic rhythm.  -less than 3 minutes  NSTEMI? Troponin tr up 5/11. -echo ok except for PAP tr up  H/o CAD and diastolic HF  -Shock resolved , off pressors 5/10  5/10 ; cardiology signed off does not feel like this was an acute non-ST elevation MI but more likely demand ischemia Echo 5/11 >EF preserved, MV /TV mod regurg, , PAP 44mHg  Plan Continue telemetry monitoring Continue Lipitor  Appreciate Cardiology input Holding aspirin and Plavix- resume when ARDS/ bleeding risk resolved Hep Sq for DVT prophylaxis   Chronic renal failure w/ acute on chronic renal failure  - baseline creatinine around 1.7-2.3 - diuresed +5L 5/12,  remains +2L Plan K phos 30 mmol now Diuresis as above Trend BMP/ mag/ phos/ daily wts/ urinary output Replace electrolytes as indicated   Acute encephalopathy- new following intubation.  Suspect mostly metabolic and but did have brief less than 3-minute cardiac arrest - following commands 5/13 Plan PAD protocol with precedex and fentanyl and prn versed, mostly for vent synchrony  RASS goal -1 or for vent synchrony  Daily wake-up assessment if able  At risk for fluid and electrolyte imbalance Hypernatremia  Plan Daily renal panel increase free water 200 q8   Anemia of chronic disease  - No evidence of active bleeding currently- minimal blood tinged mucus - This appears to be more of anemia of critical illness/chronic kidney dz  - 5/12 s/p 1 U PRBC  Plan Trend CBC Transfuse for hemoglobin less than 7 Hep Sq for VTE ppx  Diabetes with hyperglycemia Plan Levemir increased to 32 units Continue sliding scale insulin- resistant  Constipation- no BM in 7+ days despite miralax and dulcolax  ABD  distention P: KUB now to r/o obstruction Hold TF Continue daily miralax and prn dulcolax   Stage IA right invasive ductal breast carcinoma, ER/PR+, HER2- s/p R breast lumpectomy and XRT in 2014. Now on daily anastrozole 42m  Plan Holding anastrozole for now follow-up outpatient  DVT prophylaxis: sq heparin SUP: PPI  Diet: tubefeed  Activity: BR Disposition : ICU   Update:  Pt's daughter, SIvin Booty who is BT[facility] LLCNP updated at bedside.   CCT 60 mins  Attending Note:  72year old female presenting with left sided pneumonia and respiratory failure that suffered a short duration PEA cardiac arrest that is now much more alert and interactive on exam, moving all ext to command with crackles on the left side.  I reviewed CXR myself, ETT is ok and improved aeration on the left, right is normal.  AXR however showed early pattern of SBO likely due narcotic use.  This was discussed with the  daughter and bedside RN, will decrease narcotic use and rely more on precedex.  Change patient to PCV as the patient is very dyssynchronous on PRVC.  Will give 3 doses of lasix with free water and potassium.  QTc is WNL, will add reglan.  Pharmacy researching use of narcotic receptor blockers in the gut if that is an option.  If no improvement by AM then will involve surgery.  AXR ordered for AM.  Patient is overall improving.  Daughter updated bedside.  The patient is critically ill with multiple organ systems failure and requires high complexity decision making for assessment and support, frequent evaluation and titration of therapies, application of advanced monitoring technologies and extensive interpretation of multiple databases.   Critical Care Time devoted to patient care services described in this note is  38  Minutes. This time reflects time of care of this signee Dr WJennet Maduro This critical care time does not reflect procedure time, or teaching time or supervisory time of PA/NP/Med student/Med Resident etc but could involve care discussion time.  WRush Farmer M.D. LBet[facility]-ErPulmonary/Critical Care Medicine. Pager: [phone] After hours pager: [phone]

## 2017-07-29 NOTE — Progress Notes (Signed)
Pts daughter insisting repeatedly that cooling blanket under patient be turned to maximum to bring down patients fever. Explained that fever was coming down slowly and that it was more comfortable for patient to not have an ice cold cooling blanket under her.  Pt is alert and does follow some commands. Rectal temp is 101.0 down from 102.1 degrees [place] since 0933.  Pt unable to communicate any discomfort. BP is good (as charted)_ and Pt remains intubated and on ventilator and sedation.  Cooling blanket turned down despite explanation.Pts Daughter stated afterward that she would give a good report on me.

## 2017-07-29 NOTE — Progress Notes (Signed)
Inpatient Diabetes Program Recommendations  AACE/ADA: New Consensus Statement on Inpatient Glycemic Control (2015)  Target Ranges:  Prepandial:   less than 140 mg/dL      Peak postprandial:   less than 180 mg/dL (1-2 hours)      Critically ill patients:  140 - 180 mg/dL   Lab Results  Component Value Date   GLUCAP 257 (H) 07/29/2017   HGBA1C 8.7 04/18/2017    Review of Glycemic ControlResults for [name], [name] (MRN [id number]) as of 07/29/2017 14:54  Ref. Range 07/28/2017 19:26 07/29/2017 00:17 07/29/2017 04:06 07/29/2017 07:29 07/29/2017 11:30  Glucose-Capillary Latest Ref Range: 65 - 99 mg/dL 245 (H) 251 (H) 231 (H) 243 (H) 257 (H)    Diabetes history: Type 2 DM  Outpatient Diabetes medications: Levemir 20 units daily, Tradjenta 5 mg daily Current orders for Inpatient glycemic control:  Novolog resistant q 4 hours  Inpatient Diabetes Program Recommendations:    Note blood sugars>goal. Consider ICU glycemic control order set phase 2/IV insulin.  If IV insulin not started, consider adding Levemir 10 units bid (this is home dose split).    Thanks,  Adah Perl, RN, BC-ADM Inpatient Diabetes Coordinator Pager [phone]

## 2017-07-29 NOTE — Progress Notes (Signed)
   07/29/17 0953  Clinical Encounter Type  Visited With Patient and family together  Visit Type Initial;Spiritual support  Referral From Nurse;Family  Consult/Referral To Chaplain  Spiritual Encounters  Spiritual Needs Prayer   Responded to a SCC for prayer.  Patient is on a vent and was resting.  Daughter at bedside said they are trying to let her rest.  Their are 4 siblings taking turns being present.  Patient is very active in the community and her faith community.  Daughter states patient has a lot of support.  Prayed with the patient.  Will follow and support as needed. Chaplain Katherene Ponto

## 2017-07-29 NOTE — Progress Notes (Signed)
PT Cancellation Note  Patient Details Name: [name] MRN: [id number] DOB: 01-16-1946   Cancelled Treatment:    Reason Eval/Treat Not Completed: (P) Medical issues which prohibited therapy Pt currently under sedation. PT will follow back tomorrow to determine appropriateness.  Jennilyn Esteve B. Migdalia Dk PT, DPT Acute Rehabilitation  [phone] Pager [phone]   [place] 07/29/2017, 9:38 AM

## 2017-07-30 ENCOUNTER — Inpatient Hospital Stay (HOSPITAL_COMMUNITY): Payer: Medicare Other

## 2017-07-30 DIAGNOSIS — J690 Pneumonitis due to inhalation of food and vomit: Secondary | ICD-10-CM

## 2017-07-30 LAB — GLUCOSE, CAPILLARY
GLUCOSE-CAPILLARY: 268 mg/dL — AB (ref 65–99)
GLUCOSE-CAPILLARY: 286 mg/dL — AB (ref 65–99)
GLUCOSE-CAPILLARY: 262 mg/dL — AB (ref 65–99)
Glucose-Capillary: 171 mg/dL — ABNORMAL HIGH (ref 65–99)
Glucose-Capillary: 164 mg/dL — ABNORMAL HIGH (ref 65–99)
Glucose-Capillary: 181 mg/dL — ABNORMAL HIGH (ref 65–99)

## 2017-07-30 LAB — CBC
HCT: 27.5 % — ABNORMAL LOW (ref 36.0–46.0)
Hemoglobin: 8.7 g/dL — ABNORMAL LOW (ref 12.0–15.0)
MCH: 25.2 pg — ABNORMAL LOW (ref 26.0–34.0)
MCHC: 31.6 g/dL (ref 30.0–36.0)
MCV: 79.7 fL (ref 78.0–100.0)
PLATELETS: 189 10*3/uL (ref 150–400)
RBC: 3.45 MIL/uL — AB (ref 3.87–5.11)
RDW: 17.4 % — AB (ref 11.5–15.5)
WBC: 33.3 10*3/uL — AB (ref 4.0–10.5)

## 2017-07-30 LAB — CULTURE, RESPIRATORY W GRAM STAIN: Culture: NORMAL

## 2017-07-30 LAB — RENAL FUNCTION PANEL
ALBUMIN: 1.8 g/dL — AB (ref 3.5–5.0)
Anion gap: 14 (ref 5–15)
BUN: 98 mg/dL — AB (ref 6–20)
CALCIUM: 9.4 mg/dL (ref 8.9–10.3)
CO2: 33 mmol/L — ABNORMAL HIGH (ref 22–32)
CREATININE: 1.94 mg/dL — AB (ref 0.44–1.00)
Chloride: 103 mmol/L (ref 101–111)
GFR, EST AFRICAN AMERICAN: 29 mL/min — AB (ref >60)
GFR, EST NON AFRICAN AMERICAN: 25 mL/min — AB (ref >60)
Glucose, Bld: 179 mg/dL — ABNORMAL HIGH (ref 65–99)
PHOSPHORUS: 5 mg/dL — AB (ref 2.5–4.6)
Potassium: 4 mmol/L (ref 3.5–5.1)
SODIUM: 150 mmol/L — AB (ref 135–145)

## 2017-07-30 LAB — BLOOD GAS, ARTERIAL
Acid-Base Excess: 9.1 mmol/L — ABNORMAL HIGH (ref 0.0–2.0)
BICARBONATE: 33.3 mmol/L — AB (ref 20.0–28.0)
Drawn by: 36496
FIO2: 40
LHR: 16 {breaths}/min
O2 Saturation: 94 %
PATIENT TEMPERATURE: 99
PEEP: 10 cmH2O
PO2 ART: 69 mmHg — AB (ref 83.0–108.0)
Pressure control: 10 cmH2O
pCO2 arterial: 47.5 mmHg (ref 32.0–48.0)
pH, Arterial: 7.461 — ABNORMAL HIGH (ref 7.350–7.450)

## 2017-07-30 LAB — PHOSPHORUS: PHOSPHORUS: 5 mg/dL — AB (ref 2.5–4.6)

## 2017-07-30 LAB — CULTURE, RESPIRATORY

## 2017-07-30 LAB — MAGNESIUM: MAGNESIUM: 2.8 mg/dL — AB (ref 1.7–2.4)

## 2017-07-30 MED ORDER — AMLODIPINE BESYLATE 10 MG PO TABS
10.0000 mg | ORAL_TABLET | Freq: Every day | ORAL | Status: DC
  Administered 2017-07-30 – 2017-08-06 (×8): 10 mg via ORAL
  Filled 2017-07-30 (×8): qty 1

## 2017-07-30 MED ORDER — SODIUM CHLORIDE 0.9 % IV SOLN
1.0000 g | Freq: Two times a day (BID) | INTRAVENOUS | Status: AC
  Administered 2017-07-31 – 2017-08-04 (×10): 1 g via INTRAVENOUS
  Filled 2017-07-30 (×11): qty 1

## 2017-07-30 MED ORDER — ACETAZOLAMIDE SODIUM 500 MG IJ SOLR
250.0000 mg | Freq: Four times a day (QID) | INTRAMUSCULAR | Status: AC
  Administered 2017-07-30 – 2017-07-31 (×3): 250 mg via INTRAVENOUS
  Filled 2017-07-30 (×3): qty 250

## 2017-07-30 MED ORDER — FENTANYL BOLUS VIA INFUSION
25.0000 ug | INTRAVENOUS | Status: DC | PRN
  Filled 2017-07-30: qty 25

## 2017-07-30 MED ORDER — SODIUM CHLORIDE 0.45 % IV SOLN
INTRAVENOUS | Status: DC
  Administered 2017-07-30: 10:00:00 via INTRAVENOUS

## 2017-07-30 MED ORDER — DEXTROSE 5 % IV SOLN
INTRAVENOUS | Status: DC
  Administered 2017-07-30 – 2017-08-04 (×7): via INTRAVENOUS

## 2017-07-30 MED ORDER — VITAL HIGH PROTEIN PO LIQD
1000.0000 mL | ORAL | Status: DC
  Administered 2017-07-30: 1000 mL
  Filled 2017-07-30 (×3): qty 1000

## 2017-07-30 MED ORDER — FUROSEMIDE 10 MG/ML IJ SOLN
40.0000 mg | Freq: Three times a day (TID) | INTRAMUSCULAR | Status: AC
  Administered 2017-07-30 (×2): 40 mg via INTRAVENOUS
  Filled 2017-07-30 (×2): qty 4

## 2017-07-30 NOTE — Evaluation (Signed)
Physical Therapy Evaluation Patient Details Name: [name] MRN: [id number] DOB: August 26, 1945 Today's Date: 07/30/2017   History of Present Illness  72 year old female with PMH as below, which is significant for Breast Ca (on anastrozole), CAD s/p DES, diastolic CHF, who was admitted to [facility] 5/7 with complaints of orthopnea and non-productive cough that have been progressive for 3 weeks. Acutely developed pleuritic chest pain,She was treated with CAP antibiotics, gentle IVF, and supplemental O2. Chest radiograph demonstrated large L sided infiltrate. Dx Acute Hypoxic respiratory failure- Multilobar PNA L >R 5/8 intubated and placed on 3 pressors, 5/10 off pressors   Clinical Impression  PTA pt independent with community level ambulation, and all iADLs. Pt currently limited in mobility by cardiovascular status, and decreased strength and endurance. Pt retains PROM of all 4 extremities, with grossly assessed strength from movement at 2/5. Bed placed in chair position and pt able to perform elbow flex/ext x5 and knee flex/ext x 5 before falling asleep. Given pt prior level of independence and family commitment to recovery pt would make excellent candidate for CIR level rehab. PT will continue to follow acutely.     Follow Up Recommendations CIR    Equipment Recommendations  Other (comment)(TBD at next venue)    Recommendations for Other Services Rehab consult     Precautions / Restrictions Precautions Precautions: Fall Restrictions Weight Bearing Restrictions: No      Mobility  Bed Mobility               General bed mobility comments: Did not attempt due to decreased strength             Pertinent Vitals/Pain Pain Assessment: Faces Faces Pain Scale: Hurts a little bit    Home Living Family/patient expects to be discharged to:: Private residence Living Arrangements: Alone Available Help at Discharge: Family;Available 24 hours/day Type of Home: Apartment Home  Access: Stairs to enter   Entrance Stairs-Number of Steps: 1 Home Layout: One level Home Equipment: None      Prior Function Level of Independence: Independent                  Extremity/Trunk Assessment   Upper Extremity Assessment Upper Extremity Assessment: RUE deficits/detail;LUE deficits/detail RUE Deficits / Details: PROM WFL, strength grossly 2/5 LUE Deficits / Details: PROM WFL, strength grossly 2/5    Lower Extremity Assessment Lower Extremity Assessment: RLE deficits/detail;LLE deficits/detail RLE Deficits / Details: PROM WFL, strength grossly 2/5 LLE Deficits / Details: PROM WFL, strength grossly 2/5       Communication   Communication: Tracheostomy  Cognition Arousal/Alertness: Lethargic Behavior During Therapy: Flat affect Overall Cognitive Status: Difficult to assess                                        General Comments General comments (skin integrity, edema, etc.): FiO2 40%O2, PEEP 8, SaO2 98%, max HR with activity 88 bpm, BP 155/63     Exercises General Exercises - Upper Extremity Elbow Flexion: AROM;5 reps;Both;Seated Elbow Extension: AROM;Both;5 reps;Seated General Exercises - Lower Extremity Long Arc Quad: AROM;Both;5 reps;Seated   Assessment/Plan    PT Assessment Patient needs continued PT services  PT Problem List Decreased strength;Decreased activity tolerance;Decreased balance;Decreased mobility;Cardiopulmonary status limiting activity       PT Treatment Interventions Gait training;Functional mobility training;Therapeutic activities;Therapeutic exercise;Balance training;Patient/family education    PT Goals (Current goals can be  found in the Care Plan section)  Acute Rehab PT Goals Patient Stated Goal: none stated PT Goal Formulation: With patient/family Time For Goal Achievement: 08/13/17 Potential to Achieve Goals: Fair    Frequency Min 3X/week    AM-PAC PT "6 Clicks" Daily Activity  Outcome Measure  Difficulty turning over in bed (including adjusting bedclothes, sheets and blankets)?: Unable Difficulty moving from lying on back to sitting on the side of the bed? : Unable Difficulty sitting down on and standing up from a chair with arms (e.g., wheelchair, bedside commode, etc,.)?: Unable Help needed moving to and from a bed to chair (including a wheelchair)?: Total Help needed walking in hospital room?: Total Help needed climbing 3-5 steps with a railing? : Total 6 Click Score: 6    End of Session Equipment Utilized During Treatment: Oxygen Activity Tolerance: Patient limited by fatigue Patient left: in bed;with call bell/phone within reach;with family/visitor present(bed in chair position ) Nurse Communication: Mobility status PT Visit Diagnosis: Muscle weakness (generalized) (M62.81);Difficulty in walking, not elsewhere classified (R26.2);Other abnormalities of gait and mobility (R26.89)    Time: 5885-0277 PT Time Calculation (min) (ACUTE ONLY): 24 min   Charges:   PT Evaluation $PT Eval High Complexity: 1 High PT Treatments $Therapeutic Exercise: 8-22 mins   PT G Codes:        Rayonna Heldman B. Migdalia Dk PT, DPT Acute Rehabilitation  [phone] Pager [phone]    Stephen 07/30/2017, 10:25 AM

## 2017-07-30 NOTE — Progress Notes (Addendum)
Name: [name] MRN: [id number] DOB: 01-23-1946    ADMISSION DATE:  07/23/2017 CONSULTATION DATE:  07/24/2017  REFERRING MD :  Dr. Ivor Costa  CHIEF COMPLAINT:  SOB  HISTORY OF PRESENT ILLNESS:  72 year old female with PMH as below, which is significant for Breast Ca (on anastrozole), CAD s/p DES, diastolic CHF, who was admitted to [facility] 5/7 with complaints of orthopnea and non-productive cough that have been progressive for 3 weeks. Acutely developed pleuritic chest pain, which prompted her to present to the clinic. In clinic she was hypoxic requiring nonrebeather to keep sats in the 90s, for which reason she was admitted. She was treated with CAP antibiotics, gentle IVF, and supplemental O2. Chest radiograph demonstrated large L sided infiltrate. She was felt to be volume overloaded on exam.  Despite Non-rebreather delivered O2, she remained hypotensive and somewhat hypotensive. She wast started on BiPAP and transferred to SDU. PCCM asked to see.   SUBJECTIVE:   Much more awake on decreased sedation requirements OGT output- bilious 500 ml since yest Afebrile overnight/ WBC holding stable  VITAL SIGNS: Temp:  [98.3 F (36.8 C)-101.6 F (38.7 C)] 99 F (37.2 C) (05/14 0355) Pulse Rate:  [70-96] 83 (05/14 0600) Resp:  [15-27] 25 (05/14 0600) BP: (120-165)/(53-106) 163/69 (05/14 0600) SpO2:  [91 %-100 %] 97 % (05/14 0600) FiO2 (%):  [40 %-50 %] 40 % (05/14 0400) Weight:  [195 lb 5.2 oz (88.6 kg)] 195 lb 5.2 oz (88.6 kg) (05/14 0500)  Intake/Output Summary (Last 24 hours) at 07/30/2017 0724 Last data filed at 07/30/2017 0600 Gross per 24 hour  Intake 1885.65 ml  Output 4700 ml  Net -2814.35 ml   PHYSICAL EXAMINATION: General:  Critically ill adult female on MV in NAD HEENT: MM pink/dry, ETT/ OGT, pupils 2/reactive Neuro: Fully awake, tracks, follows commands, Walter CV: rrr, no m/r/g PULM: even/non-labored on MV/ PCV , lungs bilaterally coarse GI: less distended/ softer, BS  +, mild tenderness  Extremities: warm/dry, trace edema  Skin: no rashes   Recent Labs  Lab 07/27/17 0626 07/28/17 0430 07/29/17 0637  NA 142 147* 148*  K 4.2 4.1 3.9  CL 104 108 106  CO2 _0 BUN 102* 101* 91*  CREATININE 3.03* 2.37* 1.97*  GLUCOSE 243* 226* 248*   Recent Labs  Lab 07/28/17 0430 07/28/17 1501 07/29/17 0637 07/30/17 0550  HGB 6.8* 8.4* 8.8* 8.7*  HCT 21.5* 26.0* 27.0* 27.5*  WBC 29.5*  --  32.9* 33.3*  PLT 228  --  210 189   Dg Abd 1 View  Result Date: 07/29/2017 CLINICAL DATA:  Abdominal distension. EXAM: ABDOMEN - 1 VIEW COMPARISON:  07/20/2016 FINDINGS: Dilated small bowel loops are again noted compatible with small bowel obstruction. NG tube tip is in the distal stomach or proximal duodenum. Prior cholecystectomy. Overall bowel dilatation has increased since prior study. No free air. IMPRESSION: NG tube tip in the distal stomach or proximal duodenum. Worsening small bowel distention compatible with small bowel obstruction. Electronically Signed   By: Rolm Baptise M.D.   On: 07/29/2017 10:27   Dg Chest Port 1 View  Result Date: 07/29/2017 CLINICAL DATA:  Follow-up pneumonia EXAM: PORTABLE CHEST 1 VIEW COMPARISON:  07/28/2017 FINDINGS: Endotracheal tube, nasogastric catheter and left subclavian central line are again seen and stable. Diffuse infiltrative changes noted throughout the left lung with mild basilar changes on the right. The overall appearance is stable from the prior study. No new focal abnormality is  noted. IMPRESSION: No significant interval change from the previous day. Electronically Signed   By: Inez Catalina M.D.   On: 07/29/2017 07:08   Antibiotics  Azith 5/7 >> 5/10 Rocephin 5/7 >> 5/10 Vanc 5/8 >> Levaquin 5/11 x 1 Meropenum 5/12 >>  Cultures MRSA 5/8 >> neg BAL 5/8>>>NEG  BCX2 5/8>>> ngtd u strep 5/7 >> neg u legionella 5/7 >> Pneumocystis smear negative RVP 5/11 >> neg  Trach asp 5/12 >> BC x 2 5/12 >> UC 5/12 >>   neg  SIGNIFICANT EVENTS  5/7 admit 5/8 started on BiPAP and transferred to SDU. Brief junctional bradycardia w/ brief PEA arrest f/b cardiogenic & septic shock 3 pressor dependent >Intubated 5/10 Off pressors  5/13  New SBO, peep 10/FiO2 50%, following commands  ASSESSMENT / PLAN:  Acute Hypoxic respiratory failure- Multilobar PNA L >R Blood tinged mucus plugging 5/11 and PM of 5/12 -  BAL neg 5/8 , RVP neg , CX ntd , Leg/Strep neg  - Autoimmune/ vasculitis  sed rate 117- > 140  RA 19/ CCP normal  ANA, ANCA, Scl, SSA - all normal, HIV neg Plan Cont vent support  Daily SBT Trend CXR/ ABG Wean PEEP/FiO2 as able  Appreciate ID assistance, continue per ID  Trend PCT  Follow culture data, repeat pan cultured 5/12 Cont BD   Brief PEA arrest following intubation. Bradycardic rhythm - < 43mns Mild troponin leak 5/11 -felt to be demand ischemia per Cards Hx CAD and diastolic HF  - Shock resolved , off pressors 5/10  - Echo 5/11 >EF preserved, MV /TV mod regurg, PAP 697mg  Plan Continue telemetry monitoring Hold lipitor Holding aspirin and Plavix- resume when ARDS/ bleeding risk resolved Hep Sq for DVT prophylaxis   Chronic renal failure w/ acute on chronic renal failure (baseline sCr 1.7-2.3) Hypernatremia  - calculated water deficit 2652 - duresed -2.8 L Plan Start D5W at 55 ml/hr Trend BMP/ mag/ phos/ daily wts/ urinary output Replace electrolytes as indicated  Acute encephalopathy- new following intubation.  Suspect mostly metabolic and but did have brief less than 3-minute cardiac arrest - following commands 5/13 Plan PAD protocol with precedex and weaning fentanyl gtt as able, currently awake and interacting RASS goal 0/ -1  Anemia of chronic disease  - No evidence of active bleeding currently - 5/12 s/p 1 U PRBC  Plan Trend CBC Transfuse for hemoglobin less than 7 Hep Sq for VTE ppx  Diabetes with hyperglycemia Plan Continue ICU SSI If CBGs consistently  greater than 180, add back levemir  Illeus 5/14 Constipation- no BM in 7+ days despite miralax and dulcolax  - s/p dose of relistor sq 5/14 - repeat AXR 5/14 showing decreased gaseous distention of small bowel loops, persistent gaseous distention of colonic loops  P: NPO Continue NGT to LIWS Finishing reglan x 4 doses goal K > 4 May need surgical consult if medical management fails  Stage IA right invasive ductal breast carcinoma, ER/PR+, HER2- s/p R breast lumpectomy and XRT in 2014. Now on daily anastrozole 68m67mPlan Holding anastrozole for now follow-up outpatient  DVT prophylaxis: sq heparin SUP: PPI  Diet: tubefeed  Activity: BR Disposition : ICU   Update:  Pt's daughter, ShaIvin Bootyho is [facility] updated at bedside 5/14.  Daughter, CarAsencion Partridgedated 5/14.  Patient has 3 daughters and son.   CCT 60 mins  BroKennieth RadGACNP-BC [place] [facility] Pgr: [phone] if no answer [phone]/2019, 7:24 AM  Attending Note:  72 year old female with left sided pneumonia and associated respiratory failure and cardiac arrest.  Patient is improving quickly and weaning but not quite ready for extubation on exam, with crackles L>R.  I reviewed CXR myself, infiltrate improving and ETT is in good position.  Daughter updated bedside.  Will continue abx for now.  Drop to 5/5 and change PEEP when on full support to 5.  Continue diureses today.  D5W for correction of sodium.  No need for steroids.  Anticipate will extubate in AM without difficulty.    The patient is critically ill with multiple organ systems failure and requires high complexity decision making for assessment and support, frequent evaluation and titration of therapies, application of advanced monitoring technologies and extensive interpretation of multiple databases.   Critical Care Time devoted to patient care services described in this note is  37  Minutes. This time reflects time of care of this signee Dr  Jennet Maduro. This critical care time does not reflect procedure time, or teaching time or supervisory time of PA/NP/Med student/Med Resident etc but could involve care discussion time.  Rush Farmer, M.D. [facility] Pulmonary/Critical Care Medicine. Pager: [phone]. After hours pager: [phone].

## 2017-07-30 NOTE — Progress Notes (Signed)
PCCM Interval Progress Note  Events:  BP steadily increasing throughout the day.  Interventions:  Add preadmission norvasc 58m daily.  Assess response then in addition, tmrw AM can consider adding back preadmission losartan and carvedilol as needed.   RMontey Hora PMetcalfPulmonary & Critical Care Medicine Pager: [phone] or ([phone]/14/2019, 7:11 PM

## 2017-07-30 NOTE — Progress Notes (Signed)
155m fentanyl wasted in sink, witnessed by RDarcella Gasman RN

## 2017-07-30 NOTE — Progress Notes (Signed)
Rehab Admissions Coordinator Note:  Patient was screened by Cleatrice Burke for appropriateness for an Inpatient Acute Rehab Consult per PT recommendation. Patient currently not at a level for intense therapies. I will follow her progress to assess timing of potential need for rehab consult.  Cleatrice Burke 07/30/2017, 12:23 PM  I can be reached at [phone].

## 2017-07-30 NOTE — Care Management Note (Addendum)
Case Management Note  Patient Details  Name: [name] MRN: [id number] Date of Birth: Dec 25, 1945  Subjective/Objective: History of Breast Ca (on anastrozole), CAD s/p DES, diastolic CHF.  Admitted for Acute Hypoxic respiratory failure- Multilobar PNA L >R        Action/Plan: Physical therapy consulted, recommended CIR.  5/14 Patient was screened by Cleatrice Burke for appropriateness for an Inpatient Acute Rehab Consult per PT recommendation. Patient currently not at a level for intense therapies; CIR will follow her progress to assess timing of potential need for rehab consult.  Expected Discharge Date:    To Be determined              Expected Discharge Plan:   To Be determined  In-House Referral:  Clinical Social Worker, Chaplain, [employer]   Discharge planning Services  CM Consult  Status of Service:  In process, will continue to follow  If discussed at Long Length of Stay Meetings, dates discussed:  07/30/2017.   Kristen Cardinal, RN 07/30/2017, 12:37 PM

## 2017-07-30 NOTE — Progress Notes (Signed)
INFECTIOUS DISEASE PROGRESS NOTE  ID: [name] is a 72 y.o. female with  Active Problems:   SOB (shortness of breath)   Chest pain   Pneumonia of left lung due to infectious organism   Acute respiratory failure with hypoxemia (HCC)   Cardiac arrest, cause unspecified (HCC)   Shock circulatory (HCC)   Septic shock ([place])   HCAP (healthcare-associated pneumonia)   Diffuse pulmonary alveolar hemorrhage  Subjective: Awake and alert, interacts.   Abtx:  Anti-infectives (From admission, onward)   Start     Dose/Rate Route Frequency Ordered Stop   07/28/17 1300  vancomycin (VANCOCIN) IVPB 1000 mg/200 mL premix  Status:  Discontinued     1,000 mg 200 mL/hr over 60 Minutes Intravenous Every 24 hours 07/28/17 1147 07/28/17 1218   07/28/17 1300  meropenem (MERREM) 1 g in sodium chloride 0.9 % 100 mL IVPB     1 g 200 mL/hr over 30 Minutes Intravenous Every 12 hours 07/28/17 1222     07/27/17 2200  levofloxacin (LEVAQUIN) IVPB 500 mg  Status:  Discontinued     500 mg 100 mL/hr over 60 Minutes Intravenous Every 48 hours 07/27/17 1342 07/28/17 1218   07/27/17 1800  vancomycin (VANCOCIN) IVPB 1000 mg/200 mL premix  Status:  Discontinued     1,000 mg 200 mL/hr over 60 Minutes Intravenous Every 24 hours 07/27/17 1232 07/27/17 1341   07/25/17 1700  vancomycin (VANCOCIN) IVPB 1000 mg/200 mL premix  Status:  Discontinued     1,000 mg 200 mL/hr over 60 Minutes Intravenous Every 24 hours 07/24/17 1425 07/27/17 1224   07/24/17 1500  vancomycin (VANCOCIN) 1,750 mg in sodium chloride 0.9 % 500 mL IVPB     1,750 mg 250 mL/hr over 120 Minutes Intravenous  Once 07/24/17 1351 07/24/17 1707   07/23/17 2200  azithromycin (ZITHROMAX) 500 mg in sodium chloride 0.9 % 250 mL IVPB  Status:  Discontinued     500 mg 250 mL/hr over 60 Minutes Intravenous Every 24 hours 07/23/17 1856 07/27/17 1341   07/23/17 1915  cefTRIAXone (ROCEPHIN) 2 g in sodium chloride 0.9 % 100 mL IVPB  Status:  Discontinued     2  g 200 mL/hr over 30 Minutes Intravenous Every 24 hours 07/23/17 1914 07/27/17 1341      Medications:  Scheduled: . acetaZOLAMIDE  250 mg Intravenous Q6H  . chlorhexidine gluconate (MEDLINE KIT)  15 mL Mouth Rinse BID  . feeding supplement (VITAL HIGH PROTEIN)  1,000 mL Per Tube Q24H  . free water  250 mL Per Tube Q6H  . furosemide  40 mg Intravenous Q8H  . heparin injection (subcutaneous)  5,000 Units Subcutaneous Q8H  . insulin aspart  0-20 Units Subcutaneous Q4H  . ipratropium-albuterol  3 mL Nebulization Q4H  . mouth rinse  15 mL Mouth Rinse 10 times per day    Objective: Vital signs in last 24 hours: Temp:  [98.3 F (36.8 C)-99.8 F (37.7 C)] 99 F (37.2 C) (05/14 1123) Pulse Rate:  [70-90] 81 (05/14 1300) Resp:  [11-27] 17 (05/14 1300) BP: (120-163)/(53-89) 158/65 (05/14 1300) SpO2:  [93 %-100 %] 93 % (05/14 1300) FiO2 (%):  [40 %-50 %] 40 % (05/14 1300) Weight:  [88.6 kg (195 lb 5.2 oz)] 88.6 kg (195 lb 5.2 oz) (05/14 0500)   General appearance: alert and no distress Resp: rhonchi anterior - bilateral Cardio: regular rate and rhythm GI: abnormal findings:  distended and hypoactive bowel sounds Extremities: edema none in LE  Lab Results Recent Labs    07/29/17 0637 07/30/17 0550  WBC 32.9* 33.3*  HGB 8.8* 8.7*  HCT 27.0* 27.5*  NA 148* 150*  K 3.9 4.0  CL 106 103  CO2 31 33*  BUN 91* 98*  CREATININE 1.97* 1.94*   Liver Panel Recent Labs    07/30/17 0550  ALBUMIN 1.8*   Sedimentation Rate Recent Labs    07/28/17 1501  ESRSEDRATE 140*   C-Reactive Protein No results for input(s): CRP in the last 72 hours.  Microbiology: Recent Results (from the past 240 hour(s))  MRSA PCR Screening     Status: None   Collection Time: 07/24/17  1:38 AM  Result Value Ref Range Status   MRSA by PCR NEGATIVE NEGATIVE Final    Comment:        The GeneXpert MRSA Assay (FDA approved for NASAL specimens only), is one component of a comprehensive MRSA  colonization surveillance program. It is not intended to diagnose MRSA infection nor to guide or monitor treatment for MRSA infections. Performed at [facility], [place] [address], [place], [place] [address]   Culture, blood (Routine X 2) w Reflex to ID Panel     Status: None   Collection Time: 07/24/17  6:00 AM  Result Value Ref Range Status   Specimen Description BLOOD LEFT ANTECUBITAL  Final   Special Requests   Final    BOTTLES DRAWN AEROBIC AND ANAEROBIC Blood Culture adequate volume   Culture   Final    NO GROWTH 5 DAYS Performed at [facility], [place] [address] [place], [place] [address]    Report Status 07/29/2017 FINAL  Final  Culture, blood (Routine X 2) w Reflex to ID Panel     Status: None   Collection Time: 07/24/17  6:20 AM  Result Value Ref Range Status   Specimen Description BLOOD LEFT FOREARM  Final   Special Requests   Final    BOTTLES DRAWN AEROBIC AND ANAEROBIC Blood Culture adequate volume   Culture   Final    NO GROWTH 5 DAYS Performed at [facility], [place] [address], [place], [place] [address]    Report Status 07/29/2017 FINAL  Final  Culture, bal-quantitative     Status: None   Collection Time: 07/24/17  1:54 PM  Result Value Ref Range Status   Specimen Description BRONCHIAL WASHINGS  Final   Special Requests Immunocompromised  Final   Gram Stain   Final    FEW WBC PRESENT, PREDOMINANTLY PMN NO ORGANISMS SEEN    Culture   Final    NO GROWTH 2 DAYS Performed at [facility], [place] [address] [place], [place] [address]    Report Status 07/26/2017 FINAL  Final  Pneumocystis smear by DFA     Status: None   Collection Time: 07/24/17  1:54 PM  Result Value Ref Range Status   Specimen Source-[facility] BRONCHIAL WASHINGS  Final   Pneumocystis jiroveci Ag NEGATIVE  Final    Comment: Performed at [facility] Performed at [facility], [address] [place], [place] [address]   Respiratory Panel by PCR     Status: None    Collection Time: 07/27/17  2:37 PM  Result Value Ref Range Status   Adenovirus NOT DETECTED NOT DETECTED Final   Coronavirus 229E NOT DETECTED NOT DETECTED Final   Coronavirus HKU1 NOT DETECTED NOT DETECTED Final   Coronavirus NL63 NOT DETECTED NOT DETECTED Final  Coronavirus OC43 NOT DETECTED NOT DETECTED Final   Metapneumovirus NOT DETECTED NOT DETECTED Final   Rhinovirus / Enterovirus NOT DETECTED NOT DETECTED Final   Influenza A NOT DETECTED NOT DETECTED Corrected   Influenza B NOT DETECTED NOT DETECTED Final   Parainfluenza Virus 1 NOT DETECTED NOT DETECTED Final   Parainfluenza Virus 2 NOT DETECTED NOT DETECTED Final   Parainfluenza Virus 3 NOT DETECTED NOT DETECTED Final   Parainfluenza Virus 4 NOT DETECTED NOT DETECTED Final   Respiratory Syncytial Virus NOT DETECTED NOT DETECTED Final   Bordetella pertussis NOT DETECTED NOT DETECTED Final   Chlamydophila pneumoniae NOT DETECTED NOT DETECTED Final   Mycoplasma pneumoniae NOT DETECTED NOT DETECTED Final    Comment: Performed at [facility], [place] [address], [place], [place] [address]  Culture, respiratory (NON-Expectorated)     Status: None   Collection Time: 07/28/17 11:26 AM  Result Value Ref Range Status   Specimen Description TRACHEAL ASPIRATE  Final   Special Requests NONE  Final   Gram Stain   Final    ABUNDANT WBC PRESENT,BOTH PMN AND MONONUCLEAR NO SQUAMOUS EPITHELIAL CELLS SEEN NO ORGANISMS SEEN    Culture   Final    FEW Consistent with normal respiratory flora. Performed at [facility], [place] [address], [place], [place] [address]    Report Status 07/30/2017 FINAL  Final  Culture, Urine     Status: None   Collection Time: 07/28/17  1:11 PM  Result Value Ref Range Status   Specimen Description URINE, CLEAN CATCH  Final   Special Requests NONE  Final   Culture   Final    NO GROWTH Performed at [facility], [place] [address] [place], [place] [address]    Report Status 07/29/2017 FINAL   Final  Culture, blood (Routine X 2) w Reflex to ID Panel     Status: None (Preliminary result)   Collection Time: 07/28/17  3:01 PM  Result Value Ref Range Status   Specimen Description BLOOD LEFT ARM  Final   Special Requests   Final    BOTTLES DRAWN AEROBIC AND ANAEROBIC Blood Culture adequate volume   Culture   Final    NO GROWTH 2 DAYS Performed at [facility], [place] [address] [place], [place] [address]    Report Status PENDING  Incomplete  Culture, blood (Routine X 2) w Reflex to ID Panel     Status: None (Preliminary result)   Collection Time: 07/28/17  3:25 PM  Result Value Ref Range Status   Specimen Description BLOOD LEFT HAND  Final   Special Requests   Final    BOTTLES DRAWN AEROBIC AND ANAEROBIC Blood Culture adequate volume   Culture   Final    NO GROWTH 2 DAYS Performed at [facility], [place] [address], [place],  [address]    Report Status PENDING  Incomplete    Studies/Results: Dg Abd 1 View  Result Date: 07/29/2017 CLINICAL DATA:  Abdominal distension. EXAM: ABDOMEN - 1 VIEW COMPARISON:  07/20/2016 FINDINGS: Dilated small bowel loops are again noted compatible with small bowel obstruction. NG tube tip is in the distal stomach or proximal duodenum. Prior cholecystectomy. Overall bowel dilatation has increased since prior study. No free air. IMPRESSION: NG tube tip in the distal stomach or proximal duodenum. Worsening small bowel distention compatible with small bowel obstruction. Electronically Signed   By: Rolm Baptise M.D.   On: 07/29/2017 10:27   Dg Chest  Port 1 View  Result Date: 07/30/2017 CLINICAL DATA:  History of endotracheal tube EXAM: PORTABLE CHEST 1 VIEW COMPARISON:  07/29/2017 FINDINGS: Endotracheal tube tip at the clavicular heads. Left subclavian line with tip at the SVC. An orogastric tube reaches the stomach at least. Cardiomegaly. Coronary stent present. Asymmetric airspace disease to the left. Postoperative right axilla. IMPRESSION:  1. History of pneumonia with unchanged left more than right airspace disease. 2. Stable hardware positioning. Electronically Signed   By: Monte Fantasia M.D.   On: 07/30/2017 07:48   Dg Chest Port 1 View  Result Date: 07/29/2017 CLINICAL DATA:  Follow-up pneumonia EXAM: PORTABLE CHEST 1 VIEW COMPARISON:  07/28/2017 FINDINGS: Endotracheal tube, nasogastric catheter and left subclavian central line are again seen and stable. Diffuse infiltrative changes noted throughout the left lung with mild basilar changes on the right. The overall appearance is stable from the prior study. No new focal abnormality is noted. IMPRESSION: No significant interval change from the previous day. Electronically Signed   By: Inez Catalina M.D.   On: 07/29/2017 07:08   Dg Abd Portable 1v  Result Date: 07/30/2017 CLINICAL DATA:  Small-bowel obstruction, abdominal distension. EXAM: PORTABLE ABDOMEN - 1 VIEW COMPARISON:  Abdominal radiographs of Jul 29, 2017 FINDINGS: The esophagogastric tubes tip and proximal port lie within the duodenum. There remains moderate gaseous distention of colonic loops. No significant small bowel obstructive pattern is observed. There is some stool and gas in the rectum. No free extraluminal gas collections are observed. There are no abnormal soft tissue calcifications. There are degenerative changes of the lower lumbar spine. There surgical clips in the gallbladder fossa and at the right lung base. IMPRESSION: Decreased gaseous distention of small-bowel loops on today's study. Persistent mild gaseous distention of colonic loops. No free extraluminal gas collections. Electronically Signed   By: David  [name] M.D.   On: 07/30/2017 09:14     Assessment/Plan: CAP Aspiration, PEA arrest Alveolar hemmorrhage Fever SBO  Total days of antibiotics: 6 Merrem day 3  resp Cx normal resp flora Would treat as aspiration- 8 days of merrem SBO better on today's film. Her temps are better.  Available as  needed.          Bobby Rumpf MD, FACP Infectious Diseases (pager) [phone] [email or web address] 07/30/2017, 2:13 PM  LOS: 6 days

## 2017-07-31 ENCOUNTER — Inpatient Hospital Stay (HOSPITAL_COMMUNITY): Payer: Medicare Other

## 2017-07-31 DIAGNOSIS — R579 Shock, unspecified: Secondary | ICD-10-CM

## 2017-07-31 DIAGNOSIS — M7989 Other specified soft tissue disorders: Secondary | ICD-10-CM

## 2017-07-31 LAB — BLOOD GAS, ARTERIAL
Acid-Base Excess: 8.1 mmol/L — ABNORMAL HIGH (ref 0.0–2.0)
Bicarbonate: 31.7 mmol/L — ABNORMAL HIGH (ref 20.0–28.0)
Drawn by: 36496
FIO2: 40
LHR: 16 {breaths}/min
O2 SAT: 94.9 %
PEEP: 5 cmH2O
PH ART: 7.488 — AB (ref 7.350–7.450)
Patient temperature: 99.8
Pressure control: 10 cmH2O
pCO2 arterial: 42.5 mmHg (ref 32.0–48.0)
pO2, Arterial: 76.1 mmHg — ABNORMAL LOW (ref 83.0–108.0)

## 2017-07-31 LAB — RENAL FUNCTION PANEL
Albumin: 1.7 g/dL — ABNORMAL LOW (ref 3.5–5.0)
Anion gap: 13 (ref 5–15)
BUN: 101 mg/dL — AB (ref 6–20)
CALCIUM: 9.4 mg/dL (ref 8.9–10.3)
CO2: 31 mmol/L (ref 22–32)
CREATININE: 1.91 mg/dL — AB (ref 0.44–1.00)
Chloride: 105 mmol/L (ref 101–111)
GFR, EST AFRICAN AMERICAN: 30 mL/min — AB (ref >60)
GFR, EST NON AFRICAN AMERICAN: 25 mL/min — AB (ref >60)
Glucose, Bld: 297 mg/dL — ABNORMAL HIGH (ref 65–99)
Phosphorus: 4.1 mg/dL (ref 2.5–4.6)
Potassium: 3.6 mmol/L (ref 3.5–5.1)
Sodium: 149 mmol/L — ABNORMAL HIGH (ref 135–145)

## 2017-07-31 LAB — CBC
HCT: 28.9 % — ABNORMAL LOW (ref 36.0–46.0)
Hemoglobin: 8.9 g/dL — ABNORMAL LOW (ref 12.0–15.0)
MCH: 25.3 pg — AB (ref 26.0–34.0)
MCHC: 30.8 g/dL (ref 30.0–36.0)
MCV: 82.1 fL (ref 78.0–100.0)
PLATELETS: 187 10*3/uL (ref 150–400)
RBC: 3.52 MIL/uL — AB (ref 3.87–5.11)
RDW: 17.5 % — AB (ref 11.5–15.5)
WBC: 29.4 10*3/uL — AB (ref 4.0–10.5)

## 2017-07-31 LAB — GLUCOSE, CAPILLARY
GLUCOSE-CAPILLARY: 181 mg/dL — AB (ref 65–99)
GLUCOSE-CAPILLARY: 257 mg/dL — AB (ref 65–99)
GLUCOSE-CAPILLARY: 284 mg/dL — AB (ref 65–99)
GLUCOSE-CAPILLARY: 293 mg/dL — AB (ref 65–99)
Glucose-Capillary: 224 mg/dL — ABNORMAL HIGH (ref 65–99)
Glucose-Capillary: 206 mg/dL — ABNORMAL HIGH (ref 65–99)

## 2017-07-31 LAB — HLA-B27 ANTIGEN: HLA-B27: NEGATIVE

## 2017-07-31 LAB — PROCALCITONIN: PROCALCITONIN: 17.27 ng/mL

## 2017-07-31 LAB — MAGNESIUM: MAGNESIUM: 2.5 mg/dL — AB (ref 1.7–2.4)

## 2017-07-31 MED ORDER — HYDRALAZINE HCL 20 MG/ML IJ SOLN
10.0000 mg | Freq: Once | INTRAMUSCULAR | Status: DC
  Filled 2017-07-31: qty 1

## 2017-07-31 MED ORDER — ORAL CARE MOUTH RINSE
15.0000 mL | Freq: Two times a day (BID) | OROMUCOSAL | Status: DC
  Administered 2017-07-31 – 2017-08-06 (×10): 15 mL via OROMUCOSAL

## 2017-07-31 MED ORDER — IPRATROPIUM-ALBUTEROL 0.5-2.5 (3) MG/3ML IN SOLN: 3.0000 mL | Freq: Four times a day (QID) | RESPIRATORY_TRACT | Status: DC

## 2017-07-31 MED ORDER — METOCLOPRAMIDE HCL 5 MG/ML IJ SOLN
5.0000 mg | Freq: Four times a day (QID) | INTRAMUSCULAR | Status: DC
  Administered 2017-07-31 – 2017-08-02 (×8): 5 mg via INTRAVENOUS
  Filled 2017-07-31 (×8): qty 2

## 2017-07-31 MED ORDER — BISACODYL 10 MG RE SUPP
10.0000 mg | Freq: Once | RECTAL | Status: AC
  Administered 2017-07-31: 10 mg via RECTAL
  Filled 2017-07-31: qty 1

## 2017-07-31 MED ORDER — PHENOL 1.4 % MT LIQD
1.0000 | OROMUCOSAL | Status: DC | PRN
  Filled 2017-07-31: qty 177

## 2017-07-31 MED ORDER — CARVEDILOL 6.25 MG PO TABS
6.2500 mg | ORAL_TABLET | Freq: Two times a day (BID) | ORAL | Status: DC
  Administered 2017-07-31 – 2017-08-03 (×6): 6.25 mg via ORAL
  Filled 2017-07-31 (×6): qty 1

## 2017-07-31 MED ORDER — INSULIN GLARGINE 100 UNIT/ML ~~LOC~~ SOLN
10.0000 [IU] | Freq: Every day | SUBCUTANEOUS | Status: DC
  Administered 2017-07-31 – 2017-08-01 (×2): 10 [IU] via SUBCUTANEOUS
  Filled 2017-07-31 (×2): qty 0.1

## 2017-07-31 NOTE — Procedures (Signed)
Extubation Procedure Note  Patient Details:   Name: [name] DOB: [birth year] MRN: [id number]   Airway Documentation:    Vent end date: 07/31/17 Vent end time: 1145   Evaluation  O2 sats: stable throughout Complications: No apparent complications Patient did tolerate procedure well. Bilateral Breath Sounds: Diminished   Yes   Patient extubated to [place]. Vital signs stable at this time. No complications. RT will continue to monitor.  Mcneil Sober 07/31/2017, 11:47 AM

## 2017-07-31 NOTE — Progress Notes (Signed)
[facility] ADULT ICU REPLACEMENT PROTOCOL FOR AM LAB REPLACEMENT ONLY  The patient does not apply for the [facility] Adult ICU Electrolyte Replacment Protocol based on the criteria listed below:    Is GFR >/= 40 ml/min? No.  Patient's GFR today is 30   Abnormal electrolyte(s):K3.6  If a panic level lab has been reported, has the CCM MD in charge been notified? Yes.  .   Physician:  S Sommer,MD  Vear Clock 07/31/2017 6:05 AM

## 2017-07-31 NOTE — Progress Notes (Addendum)
Name: [name] MRN: [id number] DOB: Jan 11, 1946    ADMISSION DATE:  07/23/2017 CONSULTATION DATE:  07/24/2017  REFERRING MD :  Dr. Ivor Costa  CHIEF COMPLAINT:  SOB  HISTORY OF PRESENT ILLNESS:  72 year old female with PMH as below, which is significant for Breast Ca (on anastrozole), CAD s/p DES, diastolic CHF, who was admitted to [facility] 5/7 with complaints of orthopnea and non-productive cough that have been progressive for 3 weeks. Acutely developed pleuritic chest pain, which prompted her to present to the clinic. In clinic she was hypoxic requiring nonrebeather to keep sats in the 90s, for which reason she was admitted. She was treated with CAP antibiotics, gentle IVF, and supplemental O2. Chest radiograph demonstrated large L sided infiltrate. She was felt to be volume overloaded on exam.  Despite Non-rebreather delivered O2, she remained hypotensive and somewhat hypotensive. She wast started on BiPAP and transferred to SDU. PCCM asked to see.   SUBJECTIVE:   HTN overnight, slightly improved with addition home norvasc.  Afebrile  Weaning PS 5/5 but sleepy on precedex    VITAL SIGNS: Temp:  [99 F (37.2 C)-99.9 F (37.7 C)] 99.8 F (37.7 C) (05/15 0332) Pulse Rate:  [75-86] 77 (05/15 0808) Resp:  [14-34] 24 (05/15 0800) BP: (135-168)/(59-68) 153/63 (05/15 0808) SpO2:  [93 %-100 %] 100 % (05/15 0800) FiO2 (%):  [30 %-40 %] 30 % (05/15 0809) Weight:  [86.4 kg (190 lb 7.6 oz)] 86.4 kg (190 lb 7.6 oz) (05/15 0500)  Intake/Output Summary (Last 24 hours) at 07/31/2017 0922 Last data filed at 07/31/2017 0800 Gross per 24 hour  Intake 2056.27 ml  Output 3045 ml  Net -988.73 ml   PHYSICAL EXAMINATION: General:  Chronically ill appearing female, NAD on vent wean  HEENT: MM pink/moist, ETT Neuro:  Sedated on precedex, RASS -1, opens eyes to voice, nods appropriately, [place]  CV: s1s2 rrr, no m/r/g PULM: even/non-labored, lungs bilaterally diminished bases otherwise essentially    GI: mildly distended, soft, mildly tender, bsx4 hypoactive, tol TF  Extremities: warm/dry, scant BLE edema  Skin: no rashes or lesions   Recent Labs  Lab 07/29/17 0637 07/30/17 0550 07/31/17 0434  NA 148* 150* 149*  K 3.9 4.0 3.6  CL 106 103 105  CO2 31 33* 31  BUN 91* 98* 101*  CREATININE 1.97* 1.94* 1.91*  GLUCOSE 248* 179* 297*   Recent Labs  Lab 07/29/17 0637 07/30/17 0550 07/31/17 0434  HGB 8.8* 8.7* 8.9*  HCT 27.0* 27.5* 28.9*  WBC 32.9* 33.3* 29.4*  PLT 210 189 187   Dg Abd 1 View  Result Date: 07/29/2017 CLINICAL DATA:  Abdominal distension. EXAM: ABDOMEN - 1 VIEW COMPARISON:  07/20/2016 FINDINGS: Dilated small bowel loops are again noted compatible with small bowel obstruction. NG tube tip is in the distal stomach or proximal duodenum. Prior cholecystectomy. Overall bowel dilatation has increased since prior study. No free air. IMPRESSION: NG tube tip in the distal stomach or proximal duodenum. Worsening small bowel distention compatible with small bowel obstruction. Electronically Signed   By: Rolm Baptise M.D.   On: 07/29/2017 10:27   Dg Chest Port 1 View  Result Date: 07/31/2017 CLINICAL DATA:  Shortness of breath. EXAM: PORTABLE CHEST 1 VIEW COMPARISON:  Radiograph of Jul 30, 2017. FINDINGS: Stable cardiomediastinal silhouette. Endotracheal and nasogastric tubes are unchanged in position. Left subclavian catheter is unchanged in position. No pneumothorax is noted. Stable diffuse left lung opacity is noted concerning for pneumonia or edema.  Stable mild right basilar subsegmental atelectasis is noted. Bony thorax is unremarkable. IMPRESSION: Stable support apparatus. Stable diffuse left lung opacity concerning for pneumonia or edema. Stable mild right basilar subsegmental atelectasis. Electronically Signed   By: Marijo Conception, M.D.   On: 07/31/2017 07:57   Dg Chest Port 1 View  Result Date: 07/30/2017 CLINICAL DATA:  History of endotracheal tube EXAM: PORTABLE  CHEST 1 VIEW COMPARISON:  07/29/2017 FINDINGS: Endotracheal tube tip at the clavicular heads. Left subclavian line with tip at the SVC. An orogastric tube reaches the stomach at least. Cardiomegaly. Coronary stent present. Asymmetric airspace disease to the left. Postoperative right axilla. IMPRESSION: 1. History of pneumonia with unchanged left more than right airspace disease. 2. Stable hardware positioning. Electronically Signed   By: Monte Fantasia M.D.   On: 07/30/2017 07:48   Dg Abd Portable 1v  Result Date: 07/31/2017 CLINICAL DATA:  Abdominal distension. EXAM: PORTABLE ABDOMEN - 1 VIEW COMPARISON:  Radiographs of Jul 30, 2017. FINDINGS: Nasogastric tube is in expected position of duodenum. Status post cholecystectomy. No significant small bowel dilatation is noted. Stable gaseous distention of the colon is noted. Stool is noted in the rectum. IMPRESSION: Stable colonic distention. No definite evidence of bowel obstruction. Electronically Signed   By: Marijo Conception, M.D.   On: 07/31/2017 07:56   Dg Abd Portable 1v  Result Date: 07/30/2017 CLINICAL DATA:  Small-bowel obstruction, abdominal distension. EXAM: PORTABLE ABDOMEN - 1 VIEW COMPARISON:  Abdominal radiographs of Jul 29, 2017 FINDINGS: The esophagogastric tubes tip and proximal port lie within the duodenum. There remains moderate gaseous distention of colonic loops. No significant small bowel obstructive pattern is observed. There is some stool and gas in the rectum. No free extraluminal gas collections are observed. There are no abnormal soft tissue calcifications. There are degenerative changes of the lower lumbar spine. There surgical clips in the gallbladder fossa and at the right lung base. IMPRESSION: Decreased gaseous distention of small-bowel loops on today's study. Persistent mild gaseous distention of colonic loops. No free extraluminal gas collections. Electronically Signed   By: David  [name] M.D.   On: 07/30/2017 09:14    Antibiotics  Azith 5/7 >> 5/10 Rocephin 5/7 >> 5/10  Vanc 5/8 >> Levaquin 5/11 x 1 Meropenem 5/12 >>  Cultures MRSA 5/8 >> neg BAL 5/8>>>NEG  BCX2 5/8>>> ngtd u strep 5/7 >> neg u legionella 5/7 >> Pneumocystis smear negative RVP 5/11 >> neg  Trach asp 5/12 >>normal flora  BC x 2 5/12 >> UC 5/12 >>  Neg  - Autoimmune/ vasculitis  sed rate 117- > 140  RA 19/ CCP normal  ANA, ANCA, Scl, SSA - all normal, HIV neg  SIGNIFICANT EVENTS  5/7 admit 5/8 started on BiPAP and transferred to SDU. Brief junctional bradycardia w/ brief PEA arrest f/b cardiogenic & septic shock 3 pressor dependent >Intubated 5/10 Off pressors  5/13  New SBO, peep 10/FiO2 50%, following commands  ASSESSMENT / PLAN:  Acute Hypoxic respiratory failure Multilobar PNA L >R  Blood tinged mucus plugging 5/11 and PM of 5/12 - resolved.  Plan Vent support - 8cc/kg  F/u CXR  F/u ABG Daily SBT  Wean sedation as below - may be able to extubate if perks up a bit  Cont PS wean for now  Continue abx as above per ID - plan 8 days meropenem  Trend pct  Continue BD  No role steroids    Brief PEA arrest following intubation. Bradycardic rhythm - <  526mns Mild troponin leak 5/11 -felt to be demand ischemia per Cards Hx CAD and diastolic HF  - Shock resolved , off pressors 5/10  - Echo 5/11 >EF preserved, MV /TV mod regurg, PAP 657mg  Hx HTN  Plan Continue telemetry monitoring Hold lipitor Holding aspirin and Plavix- resume when ARDS/ bleeding risk resolved Hep Sq for DVT prophylaxis  Continue home norvasc  Resume home coreg 5/15  Chronic renal failure w/ acute on chronic renal failure (baseline sCr 1.7-2.3) Hypernatremia  Plan Continue D5W at 55 ml/hr Trend BMP/ mag/ phos/ daily wts/ urinary output Replace electrolytes as indicated  Acute encephalopathy- improved. Suspect mostly metabolic and but did have brief less than 3-minute cardiac arrest Sedation needs on vent  Plan Wean precedex -  discussed with RN   RASS goal 0/ -1 Can likely extubate if wakes up a bit with decreased precedex   Anemia of chronic disease  - No evidence of active bleeding currently Plan Trend CBC Transfuse for hemoglobin less than 7 Hep Sq for VTE ppx  Diabetes with hyperglycemia Plan ICU SSI  Add lantus 10 units daily 5/15  Illeus 5/14 Constipation- no BM in 7+ days despite miralax and dulcolax  - s/p dose of relistor sq 5/14 P: Continue trickle TF  Continue NGT to LIWS S/p 4 doses reglan  goal K > 4 May need surgical consult - continue monitor for now - stable KUB and abd exam   Stage IA right invasive ductal breast carcinoma, ER/PR+, HER2- s/p R breast lumpectomy and XRT in 2014. Now on daily anastrozole 26m526mPlan Holding anastrozole for now follow-up outpatient  DVT prophylaxis: sq heparin SUP: PPI  Diet: tubefeed  Activity: BR Disposition : ICU   Update:  2 Daughters and other family members updated at bedside 5/15    KatNickolas MadridP 07/31/2017  9:22 AM Pager: [phone] or [phone] Note:  [age]ar old female with PMH above presenting to PCCM with respiratory failure post cardiac arrest.  On exam, patient is awake and following commands with clear lungs.  I reviewed CXR myself, ETT is in good position with infiltrate L>R.  Patient is weaning very well this AM.  Will proceed with extubation.  Titrate O2 for sat of 88-92%.  IS and flutter valve.  Ambulate.  SLP and if ok will begin diet.  Discussed with bedside RN and PCCM-NP.  Continue D5W for now for sodium.  The patient is critically ill with multiple organ systems failure and requires high complexity decision making for assessment and support, frequent evaluation and titration of therapies, application of advanced monitoring technologies and extensive interpretation of multiple databases.   Critical Care Time devoted to patient care services described in this note is  33  Minutes. This time  reflects time of care of this signee Dr WesJennet Madurohis critical care time does not reflect procedure time, or teaching time or supervisory time of PA/NP/Med student/Med Resident etc but could involve care discussion time.  WesRush Farmer.D. [facility]/Critical Care Medicine. Pager: [phone] hours pager: [phone]

## 2017-07-31 NOTE — Progress Notes (Signed)
OT Cancellation Pt being extubated. Will follow up tomorrow.  Maurie Boettcher, OT/L  OT Clinical Specialist [phone]

## 2017-07-31 NOTE — Progress Notes (Signed)
Nutrition Follow-up  DOCUMENTATION CODES:   Obesity unspecified  INTERVENTION:  - Diet advancement as medically feasible. - Will monitor for POC concerning ileus, no BM x9 days.   NUTRITION DIAGNOSIS:   Inadequate oral intake related to acute illness as evidenced by NPO status. -ongoing  GOAL:   Patient will meet greater than or equal to 90% of their needs -unmet/unable to meet at this time  MONITOR:   Diet advancement, Weight trends, Labs, I & O's  ASSESSMENT:   72 yo female admitted with hypoxia and pleuritic chest pain on 5/7. On 5/8, pt with decline in respiratory status, rapid response called with PEA arrest, pt intubated, in septic and caridogenic shock requiring pressors ; pt with hx of CAD, CHF, CKD, DM, HTN, Stage IA right invasive ductal breast carcinoma, ER/PR+, HER2- s/p R breast lumpectomy and XRT in 2014 (declined adjuvant chemotherapy. S/p adjuvant breast radiation in 2015. Currently on anastrozole), lupus  Current weight now consistent with admission weight. Pt was extubated and OGT removed about 40 minutes ago; pt on [place]. Estimated nutrition needs updated d/t extubation. Diet remains NPO at this time. No BM since 5/6.  Per Dr. Pura Spice note this AM: multilobar PNA with L great than R, mucus plugging 5/11 and 5/12 now resolved, brief PEA arrest following intubation on 5/8, chronic renal failure with acute on chronic renal failure, improved acute encephalopathy. Ileus noted 5/14 and TF decreased to trickle rate, possible need for surgical intervention, KUB and abdominal exam were stable.   Medications reviewed; 20 mg IV Pepcid/day, 40 mg IV Lasix TID, sliding scale Novolog, 10 units Lantus/day, 5 mg IV Reglan QID.  Labs reviewed; CBGs: 257, 284, and 293 mg/dL this AM, Na: 149 mmol/L, BUN: 101 mg/dL, creatinine: 1.91 mg/dL, Mg: 2.5 mg/dL, GFR: 30 mL/min.  IVF: D5 @ 55 mL/hr (224 kcal)     Diet Order:   Diet Order           Diet NPO time specified  Diet effective  now          EDUCATION NEEDS:   No education needs have been identified at this time  Skin:  Skin Assessment: Reviewed RN Assessment  Last BM:  5/6  Height:   Ht Readings from Last 1 Encounters:  07/23/17 5' 4"  (1.626 m)    Weight:   Wt Readings from Last 1 Encounters:  07/31/17 190 lb 7.6 oz (86.4 kg)    Ideal Body Weight:  54.5 kg  BMI:  Body mass index is 32.7 kg/m.  Estimated Nutritional Needs:   Kcal:  1555-1730 (18-20 kcal/kg)  Protein:  105-120 grams (1.2-1.4 grams/kg)  Fluid:  >/= 1.5 L      Jarome Matin, MS, RD, LDN, [facility] Inpatient Clinical Dietitian Pager # [phone] After hours/weekend pager # [phone]

## 2017-07-31 NOTE — Progress Notes (Signed)
Left upper extremity venous duplex has been completed. Negative for DVT. Results were given to the patient's nurse, [place].  07/31/17 3:29 PM [name] RVT

## 2017-07-31 NOTE — Progress Notes (Signed)
Inpatient Diabetes Program Recommendations  AACE/ADA: New Consensus Statement on Inpatient Glycemic Control (2015)  Target Ranges:  Prepandial:   less than 140 mg/dL      Peak postprandial:   less than 180 mg/dL (1-2 hours)      Critically ill patients:  140 - 180 mg/dL   Results for ESSENSE, BOUSQUET (MRN [id number]) as of 07/31/2017 10:27  Ref. Range 07/30/2017 07:20 07/30/2017 11:33 07/30/2017 16:36 07/30/2017 19:23 07/30/2017 23:19 07/31/2017 03:20 07/31/2017 07:41  Glucose-Capillary Latest Ref Range: 65 - 99 mg/dL 164 (H) 181 (H) 268 (H) 286 (H) 262 (H) 257 (H) 284 (H)   Review of Glycemic Control  Diabetes history: DM 2 Outpatient Diabetes medications: Tradjenta 5 mg Daily, Prandin 2 mg tid with meals, Levemir 20 units Daily Current orders for Inpatient glycemic control: Lantus 10 units, Novolog Resistant Correction 0-20 units Q4 hours  Inpatient Diabetes Program Recommendations:    Hyperglycemia upper 200's. Noted Lantus 10 added today.  Patient receiving Vital HP at 20 ml/hour. Glucose trends increased after Tube Feed initiation. Please consider Novolog 4-5 units Q4 hours Tube Feed Coverage.  Thanks,  Tama Headings RN, MSN, BC-ADM, [facility] Inpatient Diabetes Coordinator Team Pager [phone] (8a-5p)

## 2017-08-01 ENCOUNTER — Inpatient Hospital Stay (HOSPITAL_COMMUNITY): Payer: Medicare Other

## 2017-08-01 DIAGNOSIS — R1310 Dysphagia, unspecified: Secondary | ICD-10-CM

## 2017-08-01 LAB — CBC
HCT: 29.5 % — ABNORMAL LOW (ref 36.0–46.0)
HEMOGLOBIN: 9.1 g/dL — AB (ref 12.0–15.0)
MCH: 25 pg — AB (ref 26.0–34.0)
MCHC: 30.8 g/dL (ref 30.0–36.0)
MCV: 81 fL (ref 78.0–100.0)
Platelets: 209 10*3/uL (ref 150–400)
RBC: 3.64 MIL/uL — ABNORMAL LOW (ref 3.87–5.11)
RDW: 17.2 % — ABNORMAL HIGH (ref 11.5–15.5)
WBC: 31.9 10*3/uL — ABNORMAL HIGH (ref 4.0–10.5)

## 2017-08-01 LAB — GLUCOSE, CAPILLARY
GLUCOSE-CAPILLARY: 284 mg/dL — AB (ref 65–99)
Glucose-Capillary: 159 mg/dL — ABNORMAL HIGH (ref 65–99)
Glucose-Capillary: 190 mg/dL — ABNORMAL HIGH (ref 65–99)
Glucose-Capillary: 196 mg/dL — ABNORMAL HIGH (ref 65–99)
Glucose-Capillary: 213 mg/dL — ABNORMAL HIGH (ref 65–99)
Glucose-Capillary: 258 mg/dL — ABNORMAL HIGH (ref 65–99)

## 2017-08-01 LAB — BASIC METABOLIC PANEL
Anion gap: 11 (ref 5–15)
BUN: 81 mg/dL — AB (ref 6–20)
CALCIUM: 9.5 mg/dL (ref 8.9–10.3)
CHLORIDE: 110 mmol/L (ref 101–111)
CO2: 29 mmol/L (ref 22–32)
Creatinine, Ser: 1.67 mg/dL — ABNORMAL HIGH (ref 0.44–1.00)
GFR calc non Af Amer: 30 mL/min — ABNORMAL LOW (ref >60)
GFR, EST AFRICAN AMERICAN: 35 mL/min — AB (ref >60)
Glucose, Bld: 216 mg/dL — ABNORMAL HIGH (ref 65–99)
Potassium: 3.1 mmol/L — ABNORMAL LOW (ref 3.5–5.1)
SODIUM: 150 mmol/L — AB (ref 135–145)

## 2017-08-01 LAB — PHOSPHORUS: PHOSPHORUS: 2.8 mg/dL (ref 2.5–4.6)

## 2017-08-01 LAB — MAGNESIUM: MAGNESIUM: 2.2 mg/dL (ref 1.7–2.4)

## 2017-08-01 MED ORDER — INSULIN GLARGINE 100 UNIT/ML ~~LOC~~ SOLN
15.0000 [IU] | Freq: Every day | SUBCUTANEOUS | Status: DC
  Administered 2017-08-02 – 2017-08-06 (×5): 15 [IU] via SUBCUTANEOUS
  Filled 2017-08-01 (×5): qty 0.15

## 2017-08-01 MED ORDER — POTASSIUM CHLORIDE 10 MEQ/100ML IV SOLN
10.0000 meq | INTRAVENOUS | Status: AC
  Administered 2017-08-01 (×3): 10 meq via INTRAVENOUS
  Filled 2017-08-01 (×3): qty 100

## 2017-08-01 MED ORDER — INSULIN ASPART 100 UNIT/ML ~~LOC~~ SOLN
3.0000 [IU] | SUBCUTANEOUS | Status: DC
  Administered 2017-08-01 – 2017-08-05 (×16): 3 [IU] via SUBCUTANEOUS

## 2017-08-01 NOTE — Progress Notes (Addendum)
Name: [name] MRN: [id number] DOB: [birth year]    ADMISSION DATE:  07/23/2017 CONSULTATION DATE:  07/24/2017  REFERRING MD :  Dr. Ivor Costa  CHIEF COMPLAINT:  SOB  HISTORY OF PRESENT ILLNESS:  72 year old female with PMH as below, which is significant for Breast Ca (on anastrozole), CAD s/p DES, diastolic CHF, who was admitted to [facility] 5/7 with complaints of orthopnea and non-productive cough that have been progressive for 3 weeks. Acutely developed pleuritic chest pain, which prompted her to present to the clinic. In clinic she was hypoxic requiring nonrebeather to keep sats in the 90s, for which reason she was admitted. She was treated with CAP antibiotics, gentle IVF, and supplemental O2. Chest radiograph demonstrated large L sided infiltrate. She was felt to be volume overloaded on exam.  Despite Non-rebreather delivered O2, she remained hypotensive and somewhat hypotensive. She wast started on BiPAP and transferred to SDU. PCCM asked to see.   SUBJECTIVE:   Extubated yesterday.  Up in chair.  Looks great.  Confused.    VITAL SIGNS: Temp:  [98.5 F (36.9 C)-99 F (37.2 C)] 98.6 F (37 C) (05/16 0725) Pulse Rate:  [76-83] 80 (05/16 0800) Resp:  [15-26] 21 (05/16 0800) BP: (144-168)/(53-68) 162/65 (05/16 0800) SpO2:  [92 %-98 %] 97 % (05/16 0800) FiO2 (%):  [30 %] 30 % (05/15 1100) Weight:  [84.2 kg (185 lb 10 oz)] 84.2 kg (185 lb 10 oz) (05/16 0500)  Intake/Output Summary (Last 24 hours) at 08/01/2017 0910 Last data filed at 08/01/2017 0700 Gross per 24 hour  Intake 1221.29 ml  Output 1655 ml  Net -433.71 ml   General:  Chronically ill appearing female, no acute distress in the chair HEENT: MM pink/moist Neuro: Awake, alert, follows commands, generalized weakness, nonfocal, pleasantly confused oriented to self CV: s1s2 rrr, no m/r/g PULM: Respirations even nonlabored on nasal cannula, diminished bases, few scattered rhonchi JI:RCVE, non-tender, bsx4 active    Extremities: warm/dry, left upper extremity edema, no BLE edema   Skin: no rashes or lesions    Recent Labs  Lab 07/30/17 0550 07/31/17 0434 08/01/17 0500  NA 150* 149* 150*  K 4.0 3.6 3.1*  CL 103 105 110  CO2 33* 31 29  BUN 98* 101* 81*  CREATININE 1.94* 1.91* 1.67*  GLUCOSE 179* 297* 216*   Recent Labs  Lab 07/30/17 0550 07/31/17 0434 08/01/17 0500  HGB 8.7* 8.9* 9.1*  HCT 27.5* 28.9* 29.5*  WBC 33.3* 29.4* 31.9*  PLT 189 187 209   Dg Chest Port 1 View  Result Date: 07/31/2017 CLINICAL DATA:  Shortness of breath. EXAM: PORTABLE CHEST 1 VIEW COMPARISON:  Radiograph of Jul 30, 2017. FINDINGS: Stable cardiomediastinal silhouette. Endotracheal and nasogastric tubes are unchanged in position. Left subclavian catheter is unchanged in position. No pneumothorax is noted. Stable diffuse left lung opacity is noted concerning for pneumonia or edema. Stable mild right basilar subsegmental atelectasis is noted. Bony thorax is unremarkable. IMPRESSION: Stable support apparatus. Stable diffuse left lung opacity concerning for pneumonia or edema. Stable mild right basilar subsegmental atelectasis. Electronically Signed   By: Marijo Conception, M.D.   On: 07/31/2017 07:57   Dg Abd Portable 1v  Result Date: 07/31/2017 CLINICAL DATA:  Abdominal distension. EXAM: PORTABLE ABDOMEN - 1 VIEW COMPARISON:  Radiographs of Jul 30, 2017. FINDINGS: Nasogastric tube is in expected position of duodenum. Status post cholecystectomy. No significant small bowel dilatation is noted. Stable gaseous distention of the colon is noted. Stool  is noted in the rectum. IMPRESSION: Stable colonic distention. No definite evidence of bowel obstruction. Electronically Signed   By: Marijo Conception, M.D.   On: 07/31/2017 07:56   Antibiotics  Azith 5/7 >> 5/10 Rocephin 5/7 >> 5/10  Vanc 5/8 >>5/12 Levaquin 5/11 x 1 Meropenem 5/12 >>  Cultures MRSA 5/8 >> neg BAL 5/8>>>NEG  BCX2 5/8>>> ngtd u strep 5/7 >> neg u  legionella 5/7 >> Pneumocystis smear negative RVP 5/11 >> neg  Trach asp 5/12 >>normal flora  BC x 2 5/12 >> UC 5/12 >>  Neg  - Autoimmune/ vasculitis  sed rate 117- > 140  RA 19/ CCP normal  ANA, ANCA, Scl, SSA - all normal, HIV neg  SIGNIFICANT EVENTS  5/7 admit 5/8 started on BiPAP and transferred to SDU. Brief junctional bradycardia w/ brief PEA arrest f/b cardiogenic & septic shock 3 pressor dependent >Intubated 5/10 Off pressors  5/13  New SBO, peep 10/FiO2 50%, following commands  ASSESSMENT / PLAN:  Acute Hypoxic respiratory failure Multilobar PNA L >R  Blood tinged mucus plugging 5/11 and PM of 5/12 - resolved.  Plan Pulmonary hygiene Follow-up chest x-ray Mobilize Continue bronchodilators No role steroids at this time Continue antibiotics as above per ID-plan 8 days of meropenem Avoid oversedation  Brief PEA arrest following intubation. Bradycardic rhythm - < 20mns Mild troponin leak 5/11 -felt to be demand ischemia per Cards Hx CAD and diastolic HF  - Shock resolved , off pressors 5/10  - Echo 5/11 >EF preserved, MV /TV mod regurg, PAP 650mg  Hx HTN  Plan  Continue telemetry Holding aspirin and Plavix- likely resume in next 24 to 48 hours (was on hold related to bloody secretions) Continue home Norvasc, Coreg   Chronic renal failure w/ acute on chronic renal failure (baseline sCr 1.7-2.3) Hypernatremia Hypokalemia Plan Continue D5W for now Follow-up chemistry Replace potassium DC Foley catheter   Acute encephalopathy- improved. Suspect mostly metabolic and but did have brief less than 3-minute cardiac arrest Plan PT/OT Suspect she will need significant amount of rehab efforts Avoid oversedation   Anemia of chronic disease  - No evidence of active bleeding currently Plan Trend CBC Transfuse for hemoglobin less than 7 Hep Sq for VTE ppx Resume aspirin, Plavix and next 24 to 48 hours  Diabetes with hyperglycemia Plan Sliding scale  insulin Lantus May need to increase Lantus once taking p.o.'s  Illeus 5/14 Constipation-  - s/p dose of relistor sq 5/14 P: N.p.o. except meds for now Speech eval pending Intermittent follow-up KUB Monitor abdominal exam  Stage IA right invasive ductal breast carcinoma, ER/PR+, HER2- s/p R breast lumpectomy and XRT in 2014. Now on daily anastrozole 63m49mPlan Holding anastrozole for now follow-up outpatient   Update: Daughter updated at bedside 5/16.  Improving slowly, extubated 5/15.  Respiratory status looks good.  Will need ongoing rehab efforts.  Transfer to telemetry.  Will ask IMTS to assume care 5/17.  KatNickolas MadridP 08/01/2017  9:10 AM Pager: [phone] Note:  72 3ar old female with CAP and associated hemorrhage.  Respiratory failure that is now extubated on on 2L [place].  Dysphagia.  No events overnight and tolerate extubation.  Discussed with PCCM-NP  O: General: chronically ill appearing female Neuro: awake and interactive, moving all ext to command HEENT: [place]/AT, PERRL, EOM-I and MMM Heart: RRR, Nl S1/S2 and -M/R/G Lung: Diffuse crackles Abdomen: Soft, NT, ND and +BS Ext: -edema and -tenderness Skin: intact  CBC    Component Value Date/Time   WBC 31.9 (H) 08/01/2017 0500   RBC 3.64 (L) 08/01/2017 0500   HGB 9.1 (L) 08/01/2017 0500   HGB 9.3 (L) 03/14/2017 0810   HCT 29.5 (L) 08/01/2017 0500   HCT 29.2 (L) 03/14/2017 0810   PLT 209 08/01/2017 0500   PLT 292 03/14/2017 0810   PLT 351 10/11/2016 1139   MCV 81.0 08/01/2017 0500   MCV 81.3 03/14/2017 0810   MCH 25.0 (L) 08/01/2017 0500   MCHC 30.8 08/01/2017 0500   RDW 17.2 (H) 08/01/2017 0500   RDW 14.0 03/14/2017 0810   LYMPHSABS 0.6 (L) 07/24/2017 0609   LYMPHSABS 1.4 03/14/2017 0810   MONOABS 0.0 (L) 07/24/2017 0609   MONOABS 0.4 03/14/2017 0810   EOSABS 0.0 07/24/2017 0609   EOSABS 0.1 03/14/2017 0810   BASOSABS 0.0 07/24/2017 0609   BASOSABS 0.0  03/14/2017 0810   BMET    Component Value Date/Time   NA 150 (H) 08/01/2017 0500   NA 140 03/14/2017 0810   K 3.1 (L) 08/01/2017 0500   K 3.8 03/14/2017 0810   CL 110 08/01/2017 0500   CO2 29 08/01/2017 0500   CO2 27 03/14/2017 0810   GLUCOSE 216 (H) 08/01/2017 0500   GLUCOSE 177 (H) 03/14/2017 0810   BUN 81 (H) 08/01/2017 0500   BUN 35.6 (H) 03/14/2017 0810   CREATININE 1.67 (H) 08/01/2017 0500   CREATININE 1.8 (H) 03/14/2017 0810   CALCIUM 9.5 08/01/2017 0500   CALCIUM 9.6 03/14/2017 0810   GFRNONAA 30 (L) 08/01/2017 0500   GFRNONAA 45 (L) 12/17/2013 1158   GFRAA 35 (L) 08/01/2017 0500   GFRAA 52 (L) 12/17/2013 1158   I reviewed CXR myself, pulmonary infiltrate noted  A/P: Acute respiratory failure:  - Monitor for airway protection  Dysphagia:  - SLP today  CAP:  - Merrem  - F/u on cultures  Acute renal failure:  - BMET in AM  - Hydrate  Hypernatremia:  - D5W at 55, d/c once able to take PO adequately enough  Dispo  - Transfer to tele and to IMTS with PCCM off 5/17.  Family updated bedside.  Patient seen and examined, agree with above note.  I dictated the care and orders written for this patient under my direction.  Rush Farmer, [place]

## 2017-08-01 NOTE — Progress Notes (Signed)
Occupational Therapy Evaluation Patient Details Name: [name] MRN: [id number] DOB: 06-26-1945 Today's Date: 08/01/2017    History of Present Illness 72 year old female with PMH as below, which is significant for Breast Ca (on anastrozole), CAD s/p DES, diastolic CHF, who was admitted to [facility] 5/7 with complaints of orthopnea and non-productive cough that have been progressive for 3 weeks. Acutely developed pleuritic chest pain,She was treated with CAP antibiotics, gentle IVF, and supplemental O2. Chest radiograph demonstrated large L sided infiltrate. Dx Acute Hypoxic respiratory failure- Multilobar PNA L >R 5/8 intubated and placed on 3 pressors, 5/10 off pressors. Extubated 07/31/17.   Clinical Impression   PTA, pt lived alone and was independent and worked as a Print production planner. Pt demonstrates a significant delcine ini functional status due to below listed deficits. Pt currently requires Max A +2 for sit - stand transfers, mod A with grooming and Max to total a with LB ADL. Arousal and cognition improved once pt upright. VSS throughout session on 2L. Recommend intensive rehab at CIR to maximize functional level of independence to facilitate safe return home with family.  Recommend pt be placed in chair position in bed at least BID, keep LUE elevated on 2 pillows with ice pack and use gentle retrogrative massage L hand. Will follow acutely to address established goals an facilitate DC to next venue of care.     Follow Up Recommendations  CIR;Supervision/Assistance - 24 hour    Equipment Recommendations  3 in 1 bedside commode    Recommendations for Other Services Rehab consult     Precautions / Restrictions Precautions Precautions: Fall Restrictions Weight Bearing Restrictions: No      Mobility Bed Mobility Overal bed mobility: Needs Assistance Bed Mobility: Supine to Sit;Sit to Supine     Supine to sit: Max assist;+2 for physical assistance Sit to supine: +2 Max A  for physical assistance     Transfers Overall transfer level: Needs assistance   Transfers: Sit to/from Stand Sit to Stand: +2 physical assistance;Max assist - abl eto stand @ 1 min              Balance Overall balance assessment: Needs assistance Sitting-balance support: Feet unsupported;Bilateral upper extremity supported Sitting balance-Leahy Scale: Poor       Standing balance-Leahy Scale: Poor                             ADL either performed or assessed with clinical judgement   ADL Overall ADL's : Needs assistance/impaired Eating/Feeding: NPO   Grooming: Maximal assistance;Sitting Grooming Details (indicate cue type and reason): able to complete oral care with oral suction using R hand Upper Body Bathing: Maximal assistance;Sitting   Lower Body Bathing: Maximal assistance;Bed level   Upper Body Dressing : Total assistance;Sitting   Lower Body Dressing: Total assistance;Bed level               Functional mobility during ADLs: Maximal assistance;+2 for physical assistance       Vision   Additional Comments: decreased visual attention; will further assess     Perception     Praxis Praxis Praxis tested?: Deficits Deficits: Initiation;Organization    Pertinent Vitals/Pain Pain Assessment: Faces Faces Pain Scale: Hurts a little bit Pain Location: generalized  Pain Descriptors / Indicators: Discomfort Pain Intervention(s): Limited activity within patient's tolerance     Hand Dominance Right   Extremity/Trunk Assessment Upper Extremity Assessment Upper Extremity Assessment: Generalized weakness;LUE  deficits/detail;RUE deficits/detail RUE Deficits / Details: shoulder grossly 2/5; elbow/wrist/hand 3/5 RUE Coordination: decreased fine motor;decreased gross motor LUE Deficits / Details: shoulder 2/5; elbow 3/5 hand 2/5 - edematous hand LUE Coordination: decreased fine motor;decreased gross motor   Lower Extremity Assessment Lower  Extremity Assessment: Defer to PT evaluation RLE Deficits / Details: PROM WFL, strength grossly 2/5 LLE Deficits / Details: PROM WFL, strength grossly 2/5   Cervical / Trunk Assessment Cervical / Trunk Assessment: Normal   Communication Communication Communication:    Cognition Arousal/Alertness: Awake/alert Behavior During Therapy: Flat affect Overall Cognitive Status: Impaired/Different from baseline Area of Impairment: Awareness;Orientation;Attention;Memory;Following commands;Safety/judgement;Problem solving                 Orientation Level: Disoriented to;Time;Situation;Place Current Attention Level: Sustained Memory: Decreased recall of precautions;Decreased short-term memory Following Commands: Follows one step commands with increased time Safety/Judgement: Decreased awareness of safety;Decreased awareness of deficits Awareness: Intellectual Problem Solving: Slow processing;Decreased initiation;Difficulty sequencing;Requires verbal cues;Requires tactile cues General Comments: Easily distracted; more alert in upright position; recognized family members and accurate with 3/3 names; Max A +2 to stand and pt states she thinks she could getup by herself   General Comments   Supportive family    Exercises    Shoulder Instructions      Home Living Family/patient expects to be discharged to:: Private residence Living Arrangements: Alone Available Help at Discharge: Family;Available 24 hours/day Type of Home: Apartment Home Access: Stairs to enter Entrance Stairs-Number of Steps: 1   Home Layout: One level     Bathroom Shower/Tub: Teacher, early years/pre: Standard Bathroom Accessibility: Yes   Home Equipment: None          Prior Functioning/Environment Level of Independence: Independent        Comments: Print production planner; drove        OT Problem List: Decreased strength;Decreased range of motion;Decreased activity tolerance;Impaired balance  (sitting and/or standing);Decreased coordination;Decreased cognition;Decreased safety awareness;Decreased knowledge of use of DME or AE;Cardiopulmonary status limiting activity;Impaired UE functional use;Increased edema      OT Treatment/Interventions: Self-care/ADL training;Therapeutic exercise;Energy conservation;DME and/or AE instruction;Therapeutic activities;Cognitive remediation/compensation;Patient/family education;Balance training    OT Goals(Current goals can be found in the care plan section) Acute Rehab OT Goals Patient Stated Goal: to get better OT Goal Formulation: With patient/family Time For Goal Achievement: 08/15/17 Potential to Achieve Goals: Good  OT Frequency: Min 2X/week   Barriers to D/C:            Co-evaluation              AM-PAC PT "6 Clicks" Daily Activity     Outcome Measure Help from another person eating meals?: Total Help from another person taking care of personal grooming?: A Lot Help from another person toileting, which includes using toliet, bedpan, or urinal?: Total Help from another person bathing (including washing, rinsing, drying)?: A Lot Help from another person to put on and taking off regular upper body clothing?: Total Help from another person to put on and taking off regular lower body clothing?: Total 6 Click Score: 8   End of Session Equipment Utilized During Treatment: Gait belt;Oxygen(2L) Nurse Communication: Mobility status;Other (comment)(Keep L hand elevated on 2 pillows/ use ice pack)  Activity Tolerance: Patient tolerated treatment well Patient left: in bed;Other (comment);with call bell/phone within reach;with bed alarm set;with family/visitor present(chair position)  OT Visit Diagnosis: Other abnormalities of gait and mobility (R26.89);Muscle weakness (generalized) (M62.81);Other symptoms and signs involving cognitive function  Time: 6286-3817 OT Time Calculation (min): 33 min Charges:  OT General  Charges $OT Visit: 1 Visit OT Evaluation $OT Eval Moderate Complexity: 1 Mod OT Treatments $Self Care/Home Management : 8-22 mins G-Codes:     Maurie Boettcher, OT/L  OT Clinical Specialist [phone]   [facility] 08/01/2017, 5:49 PM

## 2017-08-01 NOTE — Progress Notes (Signed)
Attempted to give report. Waiting on the RN calling back.

## 2017-08-01 NOTE — Progress Notes (Signed)
Physical Therapy Treatment Patient Details Name: [name] MRN: [id number] DOB: 1945/10/13 Today's Date: 08/01/2017    History of Present Illness 72 year old female with PMH as below, which is significant for Breast Ca (on anastrozole), CAD s/p DES, diastolic CHF, who was admitted to [facility] 5/7 with complaints of orthopnea and non-productive cough that have been progressive for 3 weeks. Acutely developed pleuritic chest pain,She was treated with CAP antibiotics, gentle IVF, and supplemental O2. Chest radiograph demonstrated large L sided infiltrate. Dx Acute Hypoxic respiratory failure- Multilobar PNA L >R 5/8 intubated and placed on 3 pressors, 5/10 off pressors     PT Comments    Pt is making good progress towards her goals today, however is limited in her safe mobility by decreased strength, decreased balance, decreased endurance and decreased safety awareness. Pt currently maxAx2 for supine to sit and total Ax 2 for sit>supine. Pt able to sit EoB for 5 minutes and participated in limited therapeutic exercise.  D/c recommendation remains appropriate at this time. PT will follow acutely.    Follow Up Recommendations  CIR     Equipment Recommendations  Other (comment)(TBD at next venue)    Recommendations for Other Services Rehab consult     Precautions / Restrictions Precautions Precautions: Fall Restrictions Weight Bearing Restrictions: No    Mobility  Bed Mobility Overal bed mobility: Needs Assistance Bed Mobility: Supine to Sit;Sit to Supine     Supine to sit: Max assist;+2 for physical assistance Sit to supine: +2 for physical assistance;Total assist   General bed mobility comments: Did not attempt due to decreased strength            Balance Overall balance assessment: Needs assistance Sitting-balance support: Feet unsupported;Bilateral upper extremity supported Sitting balance-Leahy Scale: Poor                                       Cognition Arousal/Alertness: Lethargic Behavior During Therapy: Flat affect Overall Cognitive Status: Difficult to assess                                        Exercises General Exercises - Upper Extremity Elbow Flexion: AROM;5 reps;Both;Seated Elbow Extension: AROM;Both;5 reps;Seated Digit Composite Flexion: AROM;Both;5 reps;Seated General Exercises - Lower Extremity Ankle Circles/Pumps: AROM;Both;5 reps Long Arc Quad: AROM;Both;5 reps;Seated Other Exercises Other Exercises: sat EoB for 5 minutes during which able to balance without assist for 1x30 sec and 1x 10 sec     General Comments        Pertinent Vitals/Pain Pain Assessment: Faces Faces Pain Scale: Hurts a little bit Pain Location: generalized  Pain Descriptors / Indicators: Discomfort Pain Intervention(s): Limited activity within patient's tolerance;Monitored during session;Repositioned           PT Goals (current goals can now be found in the care plan section) Acute Rehab PT Goals Patient Stated Goal: none stated PT Goal Formulation: With patient/family Time For Goal Achievement: 08/13/17 Potential to Achieve Goals: Fair Progress towards PT goals: Progressing toward goals    Frequency    Min 3X/week      PT Plan Current plan remains appropriate       AM-PAC PT "6 Clicks" Daily Activity  Outcome Measure  Difficulty turning over in bed (including adjusting bedclothes, sheets and blankets)?: Unable Difficulty moving from  lying on back to sitting on the side of the bed? : Unable Difficulty sitting down on and standing up from a chair with arms (e.g., wheelchair, bedside commode, etc,.)?: Unable Help needed moving to and from a bed to chair (including a wheelchair)?: Total Help needed walking in hospital room?: Total Help needed climbing 3-5 steps with a railing? : Total 6 Click Score: 6    End of Session Equipment Utilized During Treatment: Oxygen Activity Tolerance: Patient  limited by fatigue Patient left: in bed;with call bell/phone within reach;with family/visitor present(bed in chair position ) Nurse Communication: Mobility status PT Visit Diagnosis: Muscle weakness (generalized) (M62.81);Difficulty in walking, not elsewhere classified (R26.2);Other abnormalities of gait and mobility (R26.89)     Time: 9030-1499 PT Time Calculation (min) (ACUTE ONLY): 20 min  Charges:  $Therapeutic Activity: 8-22 mins                    G Codes:       Kento Gossman B. Migdalia Dk PT, DPT Acute Rehabilitation  [phone] Pager [phone]     [place] 08/01/2017, 3:41 PM

## 2017-08-01 NOTE — Progress Notes (Signed)
Results for SHACARA, COZINE (MRN [id number]) as of 08/01/2017 13:27  Ref. Range 07/31/2017 19:13 07/31/2017 23:51 08/01/2017 03:41 08/01/2017 07:34 08/01/2017 11:41  Glucose-Capillary Latest Ref Range: 65 - 99 mg/dL 206 (H) 181 (H) 190 (H) 213 (H) 258 (H)  Noted that blood sugars continue to be greater than 180 mg/dl. Recommend increasing Lantus to 15 units daily, continue Novolog RESISTANT correction scale every 4 hours.  If blood sugars continue to be elevated, may want to consider adding Novolog 3 units tube feed coverage every 4 hours.   Harvel Ricks RN BSN CDE Diabetes Coordinator Pager: [phone]  8am-5pm

## 2017-08-01 NOTE — Progress Notes (Signed)
Second attempt to give report. RN will call back

## 2017-08-01 NOTE — Progress Notes (Signed)
eLink Physician-Brief Progress Note Patient Name: [name] DOB: [birth year] MRN: [id number]   Date of Service  08/01/2017  HPI/Events of Note  Camera care - looks stable. Daughter reports confusion since this AM - not recognizing grandkids  eICU Interventions  Continue to monitpor     Intervention Category Major Interventions: Delirium, psychosis, severe agitation - evaluation and management  Brand Males 08/01/2017, 7:30 PM

## 2017-08-01 NOTE — Progress Notes (Signed)
Pharmacy called about the insulin orders. Spoke to Santel, MD. See new orders

## 2017-08-01 NOTE — Progress Notes (Signed)
Modified Barium Swallow Progress Note  Patient Details  Name: RYLEE HUESTIS MRN: [id number] Date of Birth: 1945/04/11  Today's Date: 08/01/2017  Modified Barium Swallow completed.  Full report located under Chart Review in the Imaging Section.  Brief recommendations include the following:  Clinical Impression  Pt has a mild-moderate oral dysphagia that appears cognitively based, otherwise with functional pharyngeal response. She has adequate oral function with liquids but when given bites of puree she exhibits oral holding. When cued to swallow it is piecemeal, and mild lingual residue persists even after the rest of the bolus is cleared. Mod cues from SLP and liquid washes assisted with oral clearance. For today would start with a full liquid diet. SLP will f/u for tolerance and readiness to advance.   Swallow Evaluation Recommendations       SLP Diet Recommendations: Thin liquid;Other (Comment)(full liquid diet)   Liquid Administration via: Cup;Straw   Medication Administration: Crushed with puree   Supervision: Staff to assist with self feeding;Full supervision/cueing for compensatory strategies   Compensations: Minimize environmental distractions;Slow rate;Small sips/bites   Postural Changes: Seated upright at 90 degrees   Oral Care Recommendations: Oral care BID        [name] 08/01/2017,2:38 PM   [name], M.A. CCC-SLP [phone]

## 2017-08-01 NOTE — Evaluation (Signed)
Clinical/Bedside Swallow Evaluation Patient Details  Name: [name] MRN: [id number] Date of Birth: Nov 17, 1945  Today's Date: 08/01/2017 Time: SLP Start Time (ACUTE ONLY): 0943 SLP Stop Time (ACUTE ONLY): 1005 SLP Time Calculation (min) (ACUTE ONLY): 22 min  Past Medical History:  Past Medical History:  Diagnosis Date  . Abdominal discomfort    Chronic N/V/D. Presumptive dx Crohn's dx per elevated p ANCA. Failed Entocort and Pentasa. Sep 2003 - ileocolectomy c anastomosis per Dr Deon Pilling 2/2 adhesions - path was hegative for Crohns. EGD, Sm bowel follow through (11/03), and an eteroclysis (10/03) were unrevealing. Cuases hypomag and hypocalcemia.  . Adnexal mass 8/03   s/p lap BSO (R ovarian fibroma) & lysis of adhesions  . Allergy    Seasonal  . Anemia    Multifactorial. Baseline HgB 10-11 ish. B12 def - 150 in 3/10. Fe Def - ferritin 35 3/10. Both are being repleted.  . Breast cancer ([place]) 03/16/13   right, 5 o'clock  . CAD (coronary artery disease) 1996   1996 - PTCA and angioplasty diagonal branch. 2000 - Rotoblator & angiopllasty of diagonal. 2006 - subendocardial AMI, DES to proximal LAD.Marland Kitchen Also had 90% stenosis in distal apical LAD. EF 55 with apical hypokinesis. Indefinite ASA and Plavix.  . CHF (congestive heart failure) ([place])   . Chronic kidney disease    Chronic renal insuff baseline Cr 1.2 - 1.4 ish.  . Chronic pain    CT 10/10 = Spinal stenosis L2 - S1.  . Diabetes mellitus    Insulin dependent  . Hx of radiation therapy 06/02/13- 07/16/13   right rbeast 4500 cGy 25 sessions, right breast boost 1600 cGy in 8 sessions  . Hyperlipidemia    Managed with both a statin and Welchol. Welchol stopped 2014 2/2 cost and started on fenofibrate   . Hypertension    2006 B renal arteries patent. 2003 MRA - no RAS. 2003 pheo W/U Dr Hassell Done reportedly negative.  . Hypoxia 07/23/2017  . Lupus ([place])   . Personal history of radiation therapy   . RBBB   . Stroke [facility])    Incidental  finding MRI 2002 L lacunar infarct  . Wears dentures    top   Past Surgical History:  Past Surgical History:  Procedure Laterality Date  . ABDOMINAL HYSTERECTOMY    . BILATERAL SALPINGOOPHORECTOMY  8/03   Lap BSO (R ovarian fibroma) and adhesion lysis  . BOWEL RESECTION  2003   ileocolectomy with anastomosis 2/2 adhesions  . BREAST BIOPSY    . BREAST LUMPECTOMY Right   . BREAST LUMPECTOMY WITH NEEDLE LOCALIZATION AND AXILLARY SENTINEL LYMPH NODE BX Right 04/22/2013   Procedure: BREAST LUMPECTOMY WITH NEEDLE LOCALIZATION AND AXILLARY SENTINEL LYMPH NODE BX;  Surgeon: Stark Klein, MD;  Location: [place];  Service: General;  Laterality: Right;  . CARDIAC CATHETERIZATION     2 stents  . CHOLECYSTECTOMY    . HEMICOLECTOMY     R sided hemicolectomy  . HERNIA REPAIR     Ventral hernia repair  . PTCA  4/06   HPI:  Pt is a 72 year old female who was admitted to [facility] 5/7 with hypoxia and pleuritic chest pain. She had a decline in respiratory status 5/8 with brief PEA arrest f/b cardiogenic and septic shock requiring pressors. ETT 5/8-5/15. PMH includes CAD, CHF, CKD, DM, HTN, Stage IA right invasive ductal breast carcinoma, lupus. No prior swallow evaluations documented, although review of prior documentation suggests chronic difficulty swallowing  pills.   Assessment / Plan / Recommendation Clinical Impression  Pt's voice is hoarse and intermittently breaks s/p prolonged intubation. She does not have any overt coughing or signs of aspiration with thin liquids or purees, but she is not able to complete the 3 oz water screen due to needing to stop for deep inhalation about halfway through. Given risk factors for silent aspiration, recommend proceeding with MBS. Can have meds crushed in puree and a few ice chips pending completion, likely this afternoon.  SLP Visit Diagnosis: Dysphagia, unspecified (R13.10)    Aspiration Risk  Moderate aspiration risk    Diet  Recommendation NPO except meds;Ice chips PRN after oral care   Medication Administration: Crushed with puree    Other  Recommendations Oral Care Recommendations: Oral care QID   Follow up Recommendations        Frequency and Duration            Prognosis Prognosis for Safe Diet Advancement: Good Barriers to Reach Goals: Cognitive deficits      Swallow Study   General HPI: Pt is a 72 year old female who was admitted to [facility] 5/7 with hypoxia and pleuritic chest pain. She had a decline in respiratory status 5/8 with brief PEA arrest f/b cardiogenic and septic shock requiring pressors. ETT 5/8-5/15. PMH includes CAD, CHF, CKD, DM, HTN, Stage IA right invasive ductal breast carcinoma, lupus. No prior swallow evaluations documented, although review of prior documentation suggests chronic difficulty swallowing pills. Type of Study: Bedside Swallow Evaluation Previous Swallow Assessment: none in chart Diet Prior to this Study: NPO Temperature Spikes Noted: No Respiratory Status: Nasal cannula History of Recent Intubation: Yes Length of Intubations (days): 8 days Date extubated: 07/31/17 Behavior/Cognition: Alert;Cooperative;Requires cueing Oral Cavity Assessment: Within Functional Limits Oral Care Completed by SLP: No Oral Cavity - Dentition: Edentulous Vision: Functional for self-feeding Self-Feeding Abilities: Needs assist Patient Positioning: Upright in chair Baseline Vocal Quality: Hoarse;Other (comment)(intermittent breaks) Volitional Cough: Weak Volitional Swallow: Able to elicit    Oral/Motor/Sensory Function Overall Oral Motor/Sensory Function: Within functional limits   Ice Chips Ice chips: Within functional limits Presentation: Spoon   Thin Liquid Thin Liquid: Impaired Presentation: Cup;Self Fed;Straw Pharyngeal  Phase Impairments: Change in Vital Signs;Other (comments)(unable to complete 3 oz screen, stops for inhalation)    Nectar Thick Nectar Thick Liquid:  Not tested   Honey Thick Honey Thick Liquid: Not tested   Puree Puree: Impaired Presentation: Spoon Oral Phase Functional Implications: Prolonged oral transit   Solid   GO   Solid: Not tested        [name] 08/01/2017,10:16 AM  [name], M.A. CCC-SLP [phone]

## 2017-08-01 NOTE — Progress Notes (Signed)
Pt's family refused to transfer to [place]. Changed to 3 E. Family was concern again and would like to speak to MD in AM before transferring off the unit.

## 2017-08-02 DIAGNOSIS — I081 Rheumatic disorders of both mitral and tricuspid valves: Secondary | ICD-10-CM

## 2017-08-02 DIAGNOSIS — D638 Anemia in other chronic diseases classified elsewhere: Secondary | ICD-10-CM

## 2017-08-02 DIAGNOSIS — Z79899 Other long term (current) drug therapy: Secondary | ICD-10-CM

## 2017-08-02 DIAGNOSIS — R5381 Other malaise: Secondary | ICD-10-CM

## 2017-08-02 DIAGNOSIS — R410 Disorientation, unspecified: Secondary | ICD-10-CM

## 2017-08-02 DIAGNOSIS — E1165 Type 2 diabetes mellitus with hyperglycemia: Secondary | ICD-10-CM

## 2017-08-02 DIAGNOSIS — Z8674 Personal history of sudden cardiac arrest: Secondary | ICD-10-CM

## 2017-08-02 DIAGNOSIS — E1122 Type 2 diabetes mellitus with diabetic chronic kidney disease: Secondary | ICD-10-CM

## 2017-08-02 DIAGNOSIS — M6281 Muscle weakness (generalized): Secondary | ICD-10-CM

## 2017-08-02 DIAGNOSIS — N189 Chronic kidney disease, unspecified: Secondary | ICD-10-CM

## 2017-08-02 DIAGNOSIS — K567 Ileus, unspecified: Secondary | ICD-10-CM

## 2017-08-02 DIAGNOSIS — K5900 Constipation, unspecified: Secondary | ICD-10-CM

## 2017-08-02 DIAGNOSIS — E876 Hypokalemia: Secondary | ICD-10-CM

## 2017-08-02 DIAGNOSIS — I130 Hypertensive heart and chronic kidney disease with heart failure and stage 1 through stage 4 chronic kidney disease, or unspecified chronic kidney disease: Secondary | ICD-10-CM

## 2017-08-02 DIAGNOSIS — R7989 Other specified abnormal findings of blood chemistry: Secondary | ICD-10-CM

## 2017-08-02 DIAGNOSIS — N179 Acute kidney failure, unspecified: Secondary | ICD-10-CM

## 2017-08-02 DIAGNOSIS — G9340 Encephalopathy, unspecified: Secondary | ICD-10-CM

## 2017-08-02 DIAGNOSIS — Z8673 Personal history of transient ischemic attack (TIA), and cerebral infarction without residual deficits: Secondary | ICD-10-CM

## 2017-08-02 DIAGNOSIS — E870 Hyperosmolality and hypernatremia: Secondary | ICD-10-CM

## 2017-08-02 DIAGNOSIS — Z170 Estrogen receptor positive status [ER+]: Secondary | ICD-10-CM

## 2017-08-02 DIAGNOSIS — I5030 Unspecified diastolic (congestive) heart failure: Secondary | ICD-10-CM

## 2017-08-02 LAB — BASIC METABOLIC PANEL
Anion gap: 10 (ref 5–15)
BUN: 63 mg/dL — AB (ref 6–20)
CHLORIDE: 113 mmol/L — AB (ref 101–111)
CO2: 25 mmol/L (ref 22–32)
Calcium: 9.4 mg/dL (ref 8.9–10.3)
Creatinine, Ser: 1.38 mg/dL — ABNORMAL HIGH (ref 0.44–1.00)
GFR calc Af Amer: 44 mL/min — ABNORMAL LOW (ref >60)
GFR calc non Af Amer: 38 mL/min — ABNORMAL LOW (ref >60)
GLUCOSE: 172 mg/dL — AB (ref 65–99)
POTASSIUM: 3.1 mmol/L — AB (ref 3.5–5.1)
Sodium: 148 mmol/L — ABNORMAL HIGH (ref 135–145)

## 2017-08-02 LAB — GLUCOSE, CAPILLARY
GLUCOSE-CAPILLARY: 186 mg/dL — AB (ref 65–99)
GLUCOSE-CAPILLARY: 243 mg/dL — AB (ref 65–99)
Glucose-Capillary: 145 mg/dL — ABNORMAL HIGH (ref 65–99)
Glucose-Capillary: 156 mg/dL — ABNORMAL HIGH (ref 65–99)
Glucose-Capillary: 223 mg/dL — ABNORMAL HIGH (ref 65–99)
Glucose-Capillary: 226 mg/dL — ABNORMAL HIGH (ref 65–99)
Glucose-Capillary: 230 mg/dL — ABNORMAL HIGH (ref 65–99)

## 2017-08-02 LAB — CULTURE, BLOOD (ROUTINE X 2)
CULTURE: NO GROWTH
CULTURE: NO GROWTH
SPECIAL REQUESTS: ADEQUATE
SPECIAL REQUESTS: ADEQUATE

## 2017-08-02 LAB — MAGNESIUM: Magnesium: 2.2 mg/dL (ref 1.7–2.4)

## 2017-08-02 MED ORDER — HYDROXYZINE HCL 25 MG PO TABS
25.0000 mg | ORAL_TABLET | Freq: Once | ORAL | Status: AC
  Administered 2017-08-02: 25 mg via ORAL
  Filled 2017-08-02: qty 1

## 2017-08-02 MED ORDER — POTASSIUM CHLORIDE 20 MEQ/15ML (10%) PO SOLN
40.0000 meq | Freq: Every day | ORAL | Status: DC
  Administered 2017-08-02 – 2017-08-04 (×3): 40 meq via ORAL
  Filled 2017-08-02 (×3): qty 30

## 2017-08-02 MED ORDER — LOSARTAN POTASSIUM 50 MG PO TABS
50.0000 mg | ORAL_TABLET | Freq: Every day | ORAL | Status: DC
  Administered 2017-08-02 – 2017-08-03 (×2): 50 mg via ORAL
  Filled 2017-08-02 (×2): qty 1

## 2017-08-02 MED ORDER — PREMIER PROTEIN SHAKE
11.0000 [oz_av] | Freq: Two times a day (BID) | ORAL | Status: DC
  Administered 2017-08-03 – 2017-08-06 (×5): 11 [oz_av] via ORAL
  Filled 2017-08-02 (×19): qty 325.31

## 2017-08-02 NOTE — Consult Note (Addendum)
Physical Medicine and Rehabilitation Consult   Reason for Consult: Debility.  Referring Physician: Dr. Chase Caller.    HPI: [name] is a 72 y.o. female with history of CAD, breast cancer, chronic systolic CHF who was admitted on 07/23/16 with progressive cough, chest paina nd hypoxia. She was found to be septic with large Left lung infiltrated and was started as well as IV antibiotics for CAP.  She had brief PEA arrest post bronchoscopy revealing alveolar hemorrhage. Hospital course significant for progressive SOB requiring intubation, cardiogenic shock requiring pressors, ALI/ARDS with ABLA,  and progressive leucocytosis with fevers. ID consulted for input and recommended 8 day course of Merrem to treat aspiration event. Fevers resolving and she tolerated extubation on 05/15 and continues to have issue with delirium and agitation.  Family reports lack of sleep X 48 hours. MBS done yesterday  and she was started on liquid diet. Therapy evaluations done revealing diffuse weakness and CIR recommended due to functional deficits.    Review of Systems  Unable to perform ROS: Mental acuity  Respiratory: Negative for cough and shortness of breath.   Cardiovascular: Negative for chest pain.  Musculoskeletal: Positive for back pain.      Past Medical History:  Diagnosis Date  . Abdominal discomfort    Chronic N/V/D. Presumptive dx Crohn's dx per elevated p ANCA. Failed Entocort and Pentasa. Sep 2003 - ileocolectomy c anastomosis per Dr Deon Pilling 2/2 adhesions - path was hegative for Crohns. EGD, Sm bowel follow through (11/03), and an eteroclysis (10/03) were unrevealing. Cuases hypomag and hypocalcemia.  . Adnexal mass 8/03   s/p lap BSO (R ovarian fibroma) & lysis of adhesions  . Allergy    Seasonal  . Anemia    Multifactorial. Baseline HgB 10-11 ish. B12 def - 150 in 3/10. Fe Def - ferritin 35 3/10. Both are being repleted.  . Breast cancer ([place]) 03/16/13   right, 5 o'clock  . CAD  (coronary artery disease) 1996   1996 - PTCA and angioplasty diagonal branch. 2000 - Rotoblator & angiopllasty of diagonal. 2006 - subendocardial AMI, DES to proximal LAD.Marland Kitchen Also had 90% stenosis in distal apical LAD. EF 55 with apical hypokinesis. Indefinite ASA and Plavix.  . CHF (congestive heart failure) ([place])   . Chronic kidney disease    Chronic renal insuff baseline Cr 1.2 - 1.4 ish.  . Chronic pain    CT 10/10 = Spinal stenosis L2 - S1.  . Diabetes mellitus    Insulin dependent  . Hx of radiation therapy 06/02/13- 07/16/13   right rbeast 4500 cGy 25 sessions, right breast boost 1600 cGy in 8 sessions  . Hyperlipidemia    Managed with both a statin and Welchol. Welchol stopped 2014 2/2 cost and started on fenofibrate   . Hypertension    2006 B renal arteries patent. 2003 MRA - no RAS. 2003 pheo W/U Dr Hassell Done reportedly negative.  . Hypoxia 07/23/2017  . Lupus ([place])   . Personal history of radiation therapy   . RBBB   . Stroke [facility])    Incidental finding MRI 2002 L lacunar infarct  . Wears dentures    top    Past Surgical History:  Procedure Laterality Date  . ABDOMINAL HYSTERECTOMY    . BILATERAL SALPINGOOPHORECTOMY  8/03   Lap BSO (R ovarian fibroma) and adhesion lysis  . BOWEL RESECTION  2003   ileocolectomy with anastomosis 2/2 adhesions  . BREAST BIOPSY    . BREAST LUMPECTOMY Right   .  BREAST LUMPECTOMY WITH NEEDLE LOCALIZATION AND AXILLARY SENTINEL LYMPH NODE BX Right 04/22/2013   Procedure: BREAST LUMPECTOMY WITH NEEDLE LOCALIZATION AND AXILLARY SENTINEL LYMPH NODE BX;  Surgeon: Stark Klein, MD;  Location: [place];  Service: General;  Laterality: Right;  . CARDIAC CATHETERIZATION     2 stents  . CHOLECYSTECTOMY    . HEMICOLECTOMY     R sided hemicolectomy  . HERNIA REPAIR     Ventral hernia repair  . PTCA  4/06    Family History  Problem Relation Age of Onset  . Pancreatic cancer Brother 14  . Cancer Mother   . Breast cancer Mother 23  . Lung  cancer Maternal Aunt   . Breast cancer Maternal Aunt   . Prostate cancer Maternal Uncle   . Heart attack Maternal Grandmother   . Breast cancer Maternal Grandmother   . Kidney failure Maternal Aunt   . Hypertension Daughter     Social History:  Lives alone. Independent and worked full time as a Chemical engineer. Per reports that she has never smoked. She has never used smokeless tobacco. Per reports that she does not drink alcohol or use drugs.    Allergies  Allergen Reactions  . Percocet [Oxycodone-Acetaminophen] Other (See Comments)    headaches  . Diazepam Other (See Comments)    REACTION: Agitation  . Haloperidol Lactate Nausea And Vomiting  . Lorazepam Nausea Only  . Morphine Sulfate Other (See Comments)    REACTION: Agitation  . Propoxyphene Hcl Nausea And Vomiting  . Tramadol Hcl Nausea And Vomiting    Medications Prior to Admission  Medication Sig Dispense Refill  . acetaminophen (TYLENOL) 325 MG tablet Take 650 mg by mouth every 6 (six) hours as needed for mild pain.    Marland Kitchen amLODipine (NORVASC) 10 MG tablet Take 10 mg by mouth daily.    Marland Kitchen anastrozole (ARIMIDEX) 1 MG tablet Take 1 tablet (1 mg total) by mouth daily. 90 tablet 3  . aspirin EC 81 MG tablet Take 81 mg by mouth daily.    Marland Kitchen atorvastatin (LIPITOR) 40 MG tablet Take 1 tablet (40 mg total) by mouth at bedtime. 90 tablet 3  . cyanocobalamin (,VITAMIN B-12,) 1000 MCG/ML injection Inject 1 mL (1,000 mcg total) into the muscle every 30 (thirty) days. 1 mL 11  . cyclobenzaprine (FLEXERIL) 5 MG tablet TAKE 1 TABLET BY MOUTH AT  BEDTIME AS NEEDED FOR  MUSCLE SPASM(S) 90 tablet 0  . desloratadine (CLARINEX) 5 MG tablet Take 5 mg by mouth at bedtime as needed (allergies). Reported on 04/11/2015    . fenofibrate 54 MG tablet Take 54 mg by mouth daily.     . ferrous fumarate (HEMOCYTE - 106 MG FE) 325 (106 Fe) MG TABS tablet Take 1 tablet (106 mg of iron total) by mouth daily. 30 each 0  . furosemide (LASIX) 40 MG tablet Take  2 tablets (80 mg total) by mouth daily. 180 tablet 3  . gabapentin (NEURONTIN) 300 MG capsule Take 2 at bedtime, one in morning as needed (Patient taking differently: Take 300-600 mg by mouth See admin instructions. 359m by mouth in am as needed for nerve pain, 6062mby mouth in pm) 270 capsule 3  . hydrOXYzine (VISTARIL) 50 MG capsule TAKE 1 CAPSULE BY MOUTH ONCE DAILY AT BEDTIME AS NEEDED 90 capsule 3  . Insulin Detemir (LEVEMIR FLEXTOUCH) 100 UNIT/ML Pen Inject 36 UNITS SUBCUTANEOUSLY at the same time each day (Patient taking differently: Inject 20 Units into the  skin daily. ) 15 mL 11  . Insulin Pen Needle (B-D UF III MINI PEN NEEDLES) 31G X 5 MM MISC Use to inject insulin one time a day 100 each 3  . loperamide (IMODIUM) 2 MG capsule TAKE ONE CAPSULE BY MOUTH ONCE DAILY AS NEEDED FOR DIARRHEA OR LOOSE STOOLS 30 capsule 2  . losartan (COZAAR) 50 MG tablet Take 1 tablet (50 mg total) by mouth daily. 90 tablet 3  . nitroGLYCERIN (NITROSTAT) 0.4 MG SL tablet Place 1 tablet (0.4 mg total) under the tongue every 5 (five) minutes as needed for chest pain. 25 tablet 3  . ONE TOUCH ULTRA TEST test strip USE TO CHECK BLOOD SUGAR 3  TO 4 TIMES DAILY 400 each 3  . potassium chloride (K-DUR) 10 MEQ tablet Take 2 tablets (20 mEq total) by mouth daily. 180 tablet 3  . repaglinide (PRANDIN) 2 MG tablet TAKE 1 TABLET BY MOUTH 3  TIMES A DAY BEFORE MEALS 270 tablet 3  . TRADJENTA 5 MG TABS tablet TAKE 1 TABLET BY MOUTH  DAILY 90 tablet 3  . ULORIC 40 MG tablet Take 1 tablet by mouth daily.    . carvedilol (COREG) 6.25 MG tablet Take 6.25 mg by mouth 2 (two) times daily.    . clopidogrel (PLAVIX) 75 MG tablet TAKE 1 TABLET BY MOUTH ONCE A DAY 90 tablet 3    Home: Home Living Family/patient expects to be discharged to:: Private residence Living Arrangements: Alone Available Help at Discharge: Family, Available 24 hours/day Type of Home: Apartment Home Access: Stairs to enter Technical brewer of  Steps: 1 Home Layout: One level Bathroom Shower/Tub: Optometrist: Yes Home Equipment: None  Functional History: Prior Function Level of Independence: Independent Comments: Print production planner; drove Functional Status:  Mobility: Bed Mobility Overal bed mobility: Needs Assistance Bed Mobility: Supine to Sit, Sit to Supine Supine to sit: Max assist, +2 for physical assistance Sit to supine: +2 for physical assistance, Total assist General bed mobility comments: Did not attempt due to decreased strength  Transfers Overall transfer level: Needs assistance Transfers: Sit to/from Stand Sit to Stand: +2 physical assistance, Max assist      ADL: ADL Overall ADL's : Needs assistance/impaired Eating/Feeding: NPO Grooming: Maximal assistance, Sitting Grooming Details (indicate cue type and reason): able to complete oral care with oral suction using R hand Upper Body Bathing: Maximal assistance, Sitting Lower Body Bathing: Maximal assistance, Bed level Upper Body Dressing : Total assistance, Sitting Lower Body Dressing: Total assistance, Bed level Functional mobility during ADLs: Maximal assistance, +2 for physical assistance  Cognition: Cognition Overall Cognitive Status: Impaired/Different from baseline Orientation Level: Oriented to person, Disoriented to place, Disoriented to time, Disoriented to situation Cognition Arousal/Alertness: Awake/alert Behavior During Therapy: Flat affect Overall Cognitive Status: Impaired/Different from baseline Area of Impairment: Awareness, Orientation, Attention, Memory, Following commands, Safety/judgement, Problem solving Orientation Level: Disoriented to, Time, Situation, Place Current Attention Level: Sustained Memory: Decreased recall of precautions, Decreased short-term memory Following Commands: Follows one step commands with increased time Safety/Judgement: Decreased awareness of  safety, Decreased awareness of deficits Awareness: Intellectual Problem Solving: Slow processing, Decreased initiation, Difficulty sequencing, Requires verbal cues, Requires tactile cues General Comments: Easily distracted; more alert in upright position; recognized family members and accurate with 3/3 names; Max A +2 to stand and pt states she thinks she could getup by herself Difficult to assess due to: Intubated   Blood pressure (!) 168/66, pulse 80, temperature 98.6 F (37 C),  temperature source Oral, resp. rate (!) 23, height 5' 4"  (1.626 m), weight 81.7 kg (180 lb 1.9 oz), SpO2 99 %. Physical Exam  Nursing note and vitals reviewed. Constitutional: She appears well-nourished. No distress.  Sitting up in bed. NAD.   HENT:  Head: Normocephalic.  Eyes: Pupils are equal, round, and reactive to light.  Neck: Normal range of motion.  Cardiovascular: Normal rate.  Respiratory: Effort normal.  Decreased BS on left.   GI: Soft.  Musculoskeletal: She exhibits edema.  Neurological:  Confused and hallucinating. Oriented to self only. she thought that daughter in room was her granddaughter despite max cues. UE 2/5 prox to 3/5 distally, LE 1-2/5 prox to 2-3/5 distally.   Psychiatric:  Pt flat, cooperates with extra time to process    Results for orders placed or performed during the hospital encounter of 07/23/17 (from the past 24 hour(s))  Glucose, capillary     Status: Abnormal   Collection Time: 08/01/17 11:41 AM  Result Value Ref Range   Glucose-Capillary 258 (H) 65 - 99 mg/dL  Glucose, capillary     Status: Abnormal   Collection Time: 08/01/17  3:30 PM  Result Value Ref Range   Glucose-Capillary 284 (H) 65 - 99 mg/dL  Glucose, capillary     Status: Abnormal   Collection Time: 08/01/17  7:38 PM  Result Value Ref Range   Glucose-Capillary 196 (H) 65 - 99 mg/dL  Glucose, capillary     Status: Abnormal   Collection Time: 08/01/17 11:54 PM  Result Value Ref Range    Glucose-Capillary 159 (H) 65 - 99 mg/dL  Glucose, capillary     Status: Abnormal   Collection Time: 08/02/17  4:03 AM  Result Value Ref Range   Glucose-Capillary 145 (H) 65 - 99 mg/dL  Basic metabolic panel     Status: Abnormal   Collection Time: 08/02/17  4:05 AM  Result Value Ref Range   Sodium 148 (H) 135 - 145 mmol/L   Potassium 3.1 (L) 3.5 - 5.1 mmol/L   Chloride 113 (H) 101 - 111 mmol/L   CO2 25 22 - 32 mmol/L   Glucose, Bld 172 (H) 65 - 99 mg/dL   BUN 63 (H) 6 - 20 mg/dL   Creatinine, Ser 1.38 (H) 0.44 - 1.00 mg/dL   Calcium 9.4 8.9 - 10.3 mg/dL   GFR calc non Af Amer 38 (L) >60 mL/min   GFR calc Af Amer 44 (L) >60 mL/min   Anion gap 10 5 - 15  Magnesium     Status: None   Collection Time: 08/02/17  4:05 AM  Result Value Ref Range   Magnesium 2.2 1.7 - 2.4 mg/dL  Glucose, capillary     Status: Abnormal   Collection Time: 08/02/17  7:44 AM  Result Value Ref Range   Glucose-Capillary 156 (H) 65 - 99 mg/dL   Dg Swallowing Func-speech Pathology  Result Date: 08/01/2017 Objective Swallowing Evaluation: Type of Study: MBS-Modified Barium Swallow Study  Patient Details Name: [name] MRN: [id number] Date of Birth: 1945-03-30 Today's Date: 08/01/2017 Time: SLP Start Time (ACUTE ONLY): 1345 -SLP Stop Time (ACUTE ONLY): 1359 SLP Time Calculation (min) (ACUTE ONLY): 14 min Past Medical History: Past Medical History: Diagnosis Date . Abdominal discomfort   Chronic N/V/D. Presumptive dx Crohn's dx per elevated p ANCA. Failed Entocort and Pentasa. Sep 2003 - ileocolectomy c anastomosis per Dr Deon Pilling 2/2 adhesions - path was hegative for Crohns. EGD, Sm bowel follow through (11/03), and an eteroclysis (  10/03) were unrevealing. Cuases hypomag and hypocalcemia. . Adnexal mass 8/03  s/p lap BSO (R ovarian fibroma) & lysis of adhesions . Allergy   Seasonal . Anemia   Multifactorial. Baseline HgB 10-11 ish. B12 def - 150 in 3/10. Fe Def - ferritin 35 3/10. Both are being repleted. . Breast cancer  ([place]) 03/16/13  right, 5 o'clock . CAD (coronary artery disease) 1996  1996 - PTCA and angioplasty diagonal branch. 2000 - Rotoblator & angiopllasty of diagonal. 2006 - subendocardial AMI, DES to proximal LAD.Marland Kitchen Also had 90% stenosis in distal apical LAD. EF 55 with apical hypokinesis. Indefinite ASA and Plavix. . CHF (congestive heart failure) ([place])  . Chronic kidney disease   Chronic renal insuff baseline Cr 1.2 - 1.4 ish. . Chronic pain   CT 10/10 = Spinal stenosis L2 - S1. . Diabetes mellitus   Insulin dependent . Hx of radiation therapy 06/02/13- 07/16/13  right rbeast 4500 cGy 25 sessions, right breast boost 1600 cGy in 8 sessions . Hyperlipidemia   Managed with both a statin and Welchol. Welchol stopped 2014 2/2 cost and started on fenofibrate  . Hypertension   2006 B renal arteries patent. 2003 MRA - no RAS. 2003 pheo W/U Dr Hassell Done reportedly negative. . Hypoxia 07/23/2017 . Lupus ([place])  . Personal history of radiation therapy  . RBBB  . Stroke [facility])   Incidental finding MRI 2002 L lacunar infarct . Wears dentures   top Past Surgical History: Past Surgical History: Procedure Laterality Date . ABDOMINAL HYSTERECTOMY   . BILATERAL SALPINGOOPHORECTOMY  8/03  Lap BSO (R ovarian fibroma) and adhesion lysis . BOWEL RESECTION  2003  ileocolectomy with anastomosis 2/2 adhesions . BREAST BIOPSY   . BREAST LUMPECTOMY Right  . BREAST LUMPECTOMY WITH NEEDLE LOCALIZATION AND AXILLARY SENTINEL LYMPH NODE BX Right 04/22/2013  Procedure: BREAST LUMPECTOMY WITH NEEDLE LOCALIZATION AND AXILLARY SENTINEL LYMPH NODE BX;  Surgeon: Stark Klein, MD;  Location: [place];  Service: General;  Laterality: Right; . CARDIAC CATHETERIZATION    2 stents . CHOLECYSTECTOMY   . HEMICOLECTOMY    R sided hemicolectomy . HERNIA REPAIR    Ventral hernia repair . PTCA  4/06 HPI: Pt is a 72 year old female who was admitted to [facility] 5/7 with hypoxia and pleuritic chest pain. She had a decline in respiratory status 5/8 with brief PEA  arrest f/b cardiogenic and septic shock requiring pressors. ETT 5/8-5/15. PMH includes CAD, CHF, CKD, DM, HTN, Stage IA right invasive ductal breast carcinoma, lupus. No prior swallow evaluations documented, although review of prior documentation suggests chronic difficulty swallowing pills.  Subjective: pt alert, needs cues to follow commands Assessment / Plan / Recommendation CHL IP CLINICAL IMPRESSIONS 08/01/2017 Clinical Impression Pt has a mild-moderate oral dysphagia that appears cognitively based, otherwise with functional pharyngeal response. She has adequate oral function with liquids but when given bites of puree she exhibits oral holding. When cued to swallow it is piecemeal, and mild lingual residue persists even after the rest of the bolus is cleared. Mod cues from SLP and liquid washes assisted with oral clearance. For today would start with a full liquid diet. SLP will f/u for tolerance and readiness to advance. SLP Visit Diagnosis Dysphagia, oral phase (R13.11) Attention and concentration deficit following -- Frontal lobe and executive function deficit following -- Impact on safety and function Mild aspiration risk   CHL IP TREATMENT RECOMMENDATION 08/01/2017 Treatment Recommendations Therapy as outlined in treatment plan below   Prognosis 08/01/2017  Prognosis for Safe Diet Advancement Good Barriers to Reach Goals Cognitive deficits Barriers/Prognosis Comment -- CHL IP DIET RECOMMENDATION 08/01/2017 SLP Diet Recommendations Thin liquid;Other (Comment) Liquid Administration via Cup;Straw Medication Administration Crushed with puree Compensations Minimize environmental distractions;Slow rate;Small sips/bites Postural Changes Seated upright at 90 degrees   CHL IP OTHER RECOMMENDATIONS 08/01/2017 Recommended Consults -- Oral Care Recommendations Oral care BID Other Recommendations --   CHL IP FOLLOW UP RECOMMENDATIONS 08/01/2017 Follow up Recommendations (No Data)   CHL IP FREQUENCY AND DURATION 08/01/2017  Speech Therapy Frequency (ACUTE ONLY) min 2x/week Treatment Duration 2 weeks      CHL IP ORAL PHASE 08/01/2017 Oral Phase Impaired Oral - Pudding Teaspoon -- Oral - Pudding Cup -- Oral - Honey Teaspoon -- Oral - Honey Cup -- Oral - Nectar Teaspoon -- Oral - Nectar Cup -- Oral - Nectar Straw -- Oral - Thin Teaspoon WFL Oral - Thin Cup WFL Oral - Thin Straw WFL Oral - Puree Holding of bolus;Piecemeal swallowing;Lingual/palatal residue Oral - Mech Soft -- Oral - Regular -- Oral - Multi-Consistency -- Oral - Pill -- Oral Phase - Comment --  CHL IP PHARYNGEAL PHASE 08/01/2017 Pharyngeal Phase WFL Pharyngeal- Pudding Teaspoon -- Pharyngeal -- Pharyngeal- Pudding Cup -- Pharyngeal -- Pharyngeal- Honey Teaspoon -- Pharyngeal -- Pharyngeal- Honey Cup -- Pharyngeal -- Pharyngeal- Nectar Teaspoon -- Pharyngeal -- Pharyngeal- Nectar Cup -- Pharyngeal -- Pharyngeal- Nectar Straw -- Pharyngeal -- Pharyngeal- Thin Teaspoon -- Pharyngeal -- Pharyngeal- Thin Cup -- Pharyngeal -- Pharyngeal- Thin Straw -- Pharyngeal -- Pharyngeal- Puree -- Pharyngeal -- Pharyngeal- Mechanical Soft -- Pharyngeal -- Pharyngeal- Regular -- Pharyngeal -- Pharyngeal- Multi-consistency -- Pharyngeal -- Pharyngeal- Pill -- Pharyngeal -- Pharyngeal Comment --  CHL IP CERVICAL ESOPHAGEAL PHASE 08/01/2017 Cervical Esophageal Phase WFL Pudding Teaspoon -- Pudding Cup -- Honey Teaspoon -- Honey Cup -- Nectar Teaspoon -- Nectar Cup -- Nectar Straw -- Thin Teaspoon -- Thin Cup -- Thin Straw -- Puree -- Mechanical Soft -- Regular -- Multi-consistency -- Pill -- Cervical Esophageal Comment -- No flowsheet data found. [name] 08/01/2017, 2:39 PM  [name], M.A. CCC-SLP [phone]               Assessment/Plan: Diagnosis: debility and encephalopathy related to ARDS and multiple medical issues 1. Does the need for close, 24 hr/day medical supervision in concert with the patient's rehab needs make it unreasonable for this patient to be served in a  less intensive setting? Potentially 2. Co-Morbidities requiring supervision/potential complications: mgt of pulmonary and behavioral issues above/normalization of sleep-wake cycle 3. Due to bladder management, bowel management, safety, skin/wound care, disease management, medication administration, pain management and patient education, does the patient require 24 hr/day rehab nursing? Yes and Potentially 4. Does the patient require coordinated care of a physician, rehab nurse, PT (1-2 hrs/day, 5 days/week), OT (1-2 hrs/day, 5 days/week) and SLP (1-2 hrs/day, 5 days/week) to address physical and functional deficits in the context of the above medical diagnosis(es)? Yes and Potentially Addressing deficits in the following areas: balance, endurance, locomotion, strength, transferring, bowel/bladder control, bathing, dressing, feeding, grooming, toileting, cognition and psychosocial support 5. Can the patient actively participate in an intensive therapy program of at least 3 hrs of therapy per day at least 5 days per week? Potentially 6. The potential for patient to make measurable gains while on inpatient rehab is good 7. Anticipated functional outcomes upon discharge from inpatient rehab are min assist  with PT, min assist with OT, supervision with SLP. 8. Estimated rehab  length of stay to reach the above functional goals is: 18-24 days 9. Anticipated D/C setting: Home 10. Anticipated post D/C treatments: [place] therapy 11. Overall Rehab/Functional Prognosis: good  RECOMMENDATIONS: This patient's condition is appropriate for continued rehabilitative care in the following setting: eventually CIR when participation/cognition improve Patient has agreed to participate in recommended program. N/A Note that insurance prior authorization may be required for reimbursement for recommended care.  Comment: spoke to family present in room. Need to re-establish sleep wake cycle at present. Rehab Admissions  Coordinator to follow up.  Thanks,  Meredith Staggers, MD, Mellody Drown  I have personally performed a face to face diagnostic evaluation of this patient. Additionally, I have reviewed and concur with the physician assistant's documentation above.     Bary Leriche, PA-C 08/02/2017

## 2017-08-02 NOTE — Progress Notes (Signed)
Physical Therapy Treatment Patient Details Name: [name] MRN: [id number] DOB: Jul 26, 1945 Today's Date: 08/02/2017    History of Present Illness 72 year old female with PMH as below, which is significant for Breast Ca (on anastrozole), CAD s/p DES, diastolic CHF, who was admitted to [facility] 5/7 with complaints of orthopnea and non-productive cough that have been progressive for 3 weeks. Acutely developed pleuritic chest pain,She was treated with CAP antibiotics, gentle IVF, and supplemental O2. Chest radiograph demonstrated large L sided infiltrate. Dx Acute Hypoxic respiratory failure- Multilobar PNA L >R 5/8 intubated and placed on 3 pressors, 5/10 off pressors     PT Comments    Pt very lethargic on entry but willing to participate in therapy. Pt found to be incontinent of stool and required maxAx2 for rolling to clean. Pt very fatigued afterwards and required max encouragement and assist to perform bed level exercise. D/c plans remain appropriate at this time however may need to change if pt not able to more fully participate in therapy. PT will continue to follow acutely.    Follow Up Recommendations  CIR     Equipment Recommendations  Other (comment)(TBD at next venue)    Recommendations for Other Services Rehab consult     Precautions / Restrictions Precautions Precautions: Fall Restrictions Weight Bearing Restrictions: No          Cognition Arousal/Alertness: Lethargic Behavior During Therapy: Flat affect Overall Cognitive Status: Impaired/Different from baseline                     Current Attention Level: Sustained   Following Commands: Follows one step commands with increased time Safety/Judgement: Decreased awareness of safety;Decreased awareness of deficits Awareness: Intellectual          Exercises General Exercises - Lower Extremity Ankle Circles/Pumps: AROM;Both;5 reps Heel Slides: AROM;Both;5 reps Hip ABduction/ADduction: AAROM;Both;5  reps;Supine Hip Flexion/Marching: AAROM;Both;5 reps;Supine        Pertinent Vitals/Pain Pain Assessment: Faces Faces Pain Scale: Hurts a little bit Pain Location: generalized  Pain Descriptors / Indicators: Discomfort           PT Goals (current goals can now be found in the care plan section) Acute Rehab PT Goals Patient Stated Goal: none stated PT Goal Formulation: With patient/family Time For Goal Achievement: 08/13/17 Potential to Achieve Goals: Fair Progress towards PT goals: Not progressing toward goals - comment(pt very fatigue as not able to sleep )    Frequency    Min 3X/week      PT Plan Current plan remains appropriate       AM-PAC PT "6 Clicks" Daily Activity  Outcome Measure  Difficulty turning over in bed (including adjusting bedclothes, sheets and blankets)?: Unable Difficulty moving from lying on back to sitting on the side of the bed? : Unable Difficulty sitting down on and standing up from a chair with arms (e.g., wheelchair, bedside commode, etc,.)?: Unable Help needed moving to and from a bed to chair (including a wheelchair)?: Total Help needed walking in hospital room?: Total Help needed climbing 3-5 steps with a railing? : Total 6 Click Score: 6    End of Session Equipment Utilized During Treatment: Oxygen Activity Tolerance: Patient limited by fatigue Patient left: in bed;with call bell/phone within reach;with family/visitor present(bed in chair position ) Nurse Communication: Mobility status PT Visit Diagnosis: Muscle weakness (generalized) (M62.81);Difficulty in walking, not elsewhere classified (R26.2);Other abnormalities of gait and mobility (R26.89)     Time: 3220-2542 PT Time Calculation (  min) (ACUTE ONLY): 19 min  Charges:  $Therapeutic Exercise: 8-22 mins                    G Codes:       Ora Bollig B. Migdalia Dk PT, DPT Acute Rehabilitation  [phone] Pager [phone]     [place] 08/02/2017,  5:09 PM

## 2017-08-02 NOTE — Clinical Social Work Note (Signed)
Clinical Social Work Assessment  Patient Details  Name: [name] MRN: [id number] Date of Birth: [birth year]  Date of referral:  08/02/17               Reason for consult:  Facility Placement                Permission sought to share information with:  Chartered certified accountant granted to share information::  Yes, Verbal Permission Granted  Name::     Psychologist, counselling::  SNF  Relationship::  daughter  Contact Information:     Housing/Transportation Living arrangements for the past 2 months:  [place] of Information:  Patient Patient Interpreter Needed:  None Criminal Activity/Legal Involvement Pertinent to Current Situation/Hospitalization:  No - Comment as needed Significant Relationships:  Adult Children Lives with:  Adult Children(dtr in lawDanae Chen) Do you feel safe going back to the place where you live?  Yes Need for family participation in patient care:  Yes (Comment)  Care giving concerns:  Pt lives at home with daughter in law but do not think this will be sufficient support given current weakness.   Social Worker assessment / plan:  CSW spoke with pt and pt dtr, Lysbeth Galas, concerning PT recommendation for SNF.  Pt alert and responsive to CSW but looks at dtr to confirm information and couldn't remember the name of the dtr I law who she lives with.  CSW explained SNF and SNF referral process.  Employment status:  Retired Nurse, adult PT Recommendations:  [place] / Referral to community resources:  [place]  Patient/Family's Response to care:  Pt and dtr understanding of recommendation for rehab stay prior to return home and agreeable to looking into options.  Patient/Family's Understanding of and Emotional Response to Diagnosis, Current Treatment, and Prognosis:  No questions or concerns regarding medical treatment at this time- hopeful for pt to continue towards  recovery and be safe to return home soon.  Emotional Assessment Appearance:  Appears stated age Attitude/Demeanor/Rapport:  Inconsistent Affect (typically observed):  Appropriate, Pleasant Orientation:  Oriented to Self Alcohol / Substance use:  Not Applicable Psych involvement (Current and /or in the community):  No (Comment)  Discharge Needs  Concerns to be addressed:  Care Coordination Readmission within the last 30 days:  No Current discharge risk:  Physical Impairment Barriers to Discharge:  Continued Medical Work up   Jorge Ny, LCSW 08/02/2017, 12:00 PM

## 2017-08-02 NOTE — NC FL2 (Signed)
[place] MEDICAID FL2 LEVEL OF CARE SCREENING TOOL     IDENTIFICATION  Patient Name: [name] Birthdate: November 15, 1945 Sex: female Admission Date (Current Location): 07/23/2017  [facility] and [place] Number:  Herbalist and Address:  [facility], [place] [address], [place], [place] [address]      Provider Number: [id number]  Attending Physician Name and Address:  Axel Filler, *  Relative Name and Phone Number:       Current Level of Care: Hospital Recommended Level of Care: [place] Prior Approval Number:    Date Approved/Denied:   PASRR Number: [id number]  Discharge Plan: SNF    Current Diagnoses: Patient Active Problem List   Diagnosis Date Noted  . Pneumonia of left lung due to infectious organism   . Acute respiratory failure with hypoxemia ([place])   . Cardiac arrest, cause unspecified ([place])   . Shock circulatory ([place])   . Septic shock ([place])   . HCAP (healthcare-associated pneumonia)   . Diffuse pulmonary alveolar hemorrhage   . Chest pain 07/23/2017  . Hypoxia 07/23/2017  . SOB (shortness of breath) 10/11/2016  . Hyperparathyroidism, secondary renal ([place]) 05/02/2016  . Gout 04/11/2015  . Diabetic neuropathy associated with type 2 diabetes mellitus ([place]) 01/06/2015  . Breast cancer of lower-inner quadrant of right female breast ([place]) 03/18/2013  . Chronic venous insufficiency 01/06/2013  . Health care maintenance 05/08/2011  . DM (diabetes mellitus), type 2, uncontrolled, with renal complications ([place]) 82/42/3536  . Seasonal allergies 05/03/2010  . Hypertension   . CAD (coronary artery disease)   . Abdominal discomfort   . Anemia in chronic kidney disease   . History of stroke without residual deficits   . Hyperlipidemia associated with type 2 diabetes mellitus ([place])   . Chronic pain   . Chronic kidney disease, stage III (moderate) (HCC)   . Adnexal mass     Orientation RESPIRATION BLADDER  Height & Weight     Self  O2(2L ) Incontinent, External catheter Weight: 180 lb 1.9 oz (81.7 kg) Height:  5' 4"  (162.6 cm)  BEHAVIORAL SYMPTOMS/MOOD NEUROLOGICAL BOWEL NUTRITION STATUS      Incontinent Diet(see DC summary)  AMBULATORY STATUS COMMUNICATION OF NEEDS Skin   Extensive Assist Verbally Normal                       Personal Care Assistance Level of Assistance  Bathing, Dressing Bathing Assistance: Maximum assistance   Dressing Assistance: Maximum assistance     Functional Limitations Info             SPECIAL CARE FACTORS FREQUENCY  PT (By licensed PT), OT (By licensed OT)     PT Frequency: 5/wk OT Frequency: 5/wk            Contractures      Additional Factors Info  Code Status, Allergies Code Status Info: FULL Allergies Info: Percocet Oxycodone-acetaminophen, Diazepam, Haloperidol Lactate, Lorazepam, Morphine Sulfate, Propoxyphene Hcl, Tramadol Hcl           Current Medications (08/02/2017):  This is the current hospital active medication list Current Facility-Administered Medications  Medication Dose Route Frequency Provider Last Rate Last Dose  . acetaminophen (TYLENOL) tablet 650 mg  650 mg Oral Q6H PRN Darlina Sicilian A, NP   650 mg at 07/28/17 1012   Or  . acetaminophen (TYLENOL) suppository 650 mg  650 mg Rectal Q6H PRN Whiteheart, Cristal Ford, NP      .  albuterol (PROVENTIL) (2.5 MG/3ML) 0.083% nebulizer solution 2.5 mg  2.5 mg Nebulization Q4H PRN Whiteheart, Kathryn A, NP      . amLODipine (NORVASC) tablet 10 mg  10 mg Oral Daily Whiteheart, Kathryn A, NP   10 mg at 08/02/17 0931  . carvedilol (COREG) tablet 6.25 mg  6.25 mg Oral BID WC Whiteheart, Kathryn A, NP   6.25 mg at 08/02/17 0754  . dextrose 5 % solution   Intravenous Continuous Darlina Sicilian A, NP 55 mL/hr at 08/01/17 1800    . famotidine (PEPCID) IVPB 20 mg premix  20 mg Intravenous Q24H Marijean Heath, NP   Stopped at 08/01/17 1530  . fentaNYL (SUBLIMAZE)  bolus via infusion 25 mcg  25 mcg Intravenous Q2H PRN Whiteheart, Kathryn A, NP      . heparin injection 5,000 Units  5,000 Units Subcutaneous Q8H Whiteheart, Kathryn A, NP   5,000 Units at 08/02/17 8032  . insulin aspart (novoLOG) injection 0-20 Units  0-20 Units Subcutaneous Q4H Marijean Heath, NP   4 Units at 08/02/17 0753  . insulin aspart (novoLOG) injection 3 Units  3 Units Subcutaneous Q4H Tarry Kos, MD   3 Units at 08/02/17 [phone]  . insulin glargine (LANTUS) injection 15 Units  15 Units Subcutaneous Daily Tarry Kos, MD   15 Units at 08/02/17 0932  . losartan (COZAAR) tablet 50 mg  50 mg Oral Daily Jule Ser, DO      . MEDLINE mouth rinse  15 mL Mouth Rinse BID Darlina Sicilian A, NP   15 mL at 08/02/17 0936  . meropenem (MERREM) 1 g in sodium chloride 0.9 % 100 mL IVPB  1 g Intravenous Q12H Whiteheart, Cristal Ford, NP   Stopped at 08/02/17 0030  . metoCLOPramide (REGLAN) injection 5 mg  5 mg Intravenous Q6H Whiteheart, Kathryn A, NP   5 mg at 08/02/17 0507  . phenol (CHLORASEPTIC) mouth spray 1 spray  1 spray Mouth/Throat PRN Whiteheart, Kathryn A, NP      . potassium chloride 20 MEQ/15ML (10%) solution 40 mEq  40 mEq Oral Daily Jule Ser, DO      . protein supplement (PREMIER PROTEIN) liquid  11 oz Oral BID BM Axel Filler, MD       Facility-Administered Medications Ordered in Other Encounters  Medication Dose Route Frequency Provider Last Rate Last Dose  . 0.9 %  sodium chloride infusion   Intravenous Continuous Burgess Estelle, MD 150 mL/hr at 10/03/15 1607       Discharge Medications: Please see discharge summary for a list of discharge medications.  Relevant Imaging Results:  Relevant Lab Results:   Additional Information SS#: [id number]  Jorge Ny, LCSW

## 2017-08-02 NOTE — Progress Notes (Signed)
SLP Cancellation Note  Patient Details Name: [name] MRN: [id number] DOB: Jul 23, 1945   Cancelled treatment:       Reason Eval/Treat Not Completed: Other (comment) Pt with nursing - will f/u as able.   [name] 08/02/2017, 11:56 AM  [name], M.A. CCC-SLP [phone]

## 2017-08-02 NOTE — Progress Notes (Signed)
ICU Transfer Summary: 72 year old woman with PMHx significant for breast cancer (on anastrozole), CAD s/p DES, HFpEF, CKD, DM, HTN prior CVA per family report who was initially admitted to [facility] directly from clinic due to hypoxic respiratory failure 2/2 multilobar community acquired pneumonia.  She deteriorated overnight the evening of her admission and the following morning was subsequently intubated and transferred to the MICU.  She had bronchoscopy by Dr Nelda Marseille on 5/8 and there was concern for alveolar hemmorhage based on the findings.  Following intubation, she had junctional bradycardia with brief PEA arrest.  This further required 3 pressors for her septic shock.  She was able to come off pressers about 2 days later on 5/10.  On 5/13 she developed an SBO which has since improved and nursing reported large bowel movements since.  Her culture data has returned negative.  She also had an autoimmune work up that was negative.   Ultimately, she was extubated to nasal cannula on 5/15.  Since extubation, she has continued to have delirium here in the ICU and is globally weak and deconditioned.  PT/OT evaluations are recommending CIR.  There was concern regarding right sided weakness however, nursing reports this has improved.  Subjective:  Patient seen and examined this AM at the bedside.  Her grand daughter is present.  I also spoke on the phone with patients daughter [name]).  I went back to the room later in the morning and also spoke to patients other daughter while [name] was on the phone.  The family has requested that [facility] hospitalist take over the care of their mother.  I have contacted [facility] and they will assume care tomorrow 5/18.  The family was updated regarding this.    Patient has no acute complaints today.  She denies pain or difficulty breathing.  She is lying comfortably in the bed and in no acute distress.  She appears calm.  There has been trouble sleeping the last couple of nights  and this is concerning for the family.  They would also like to pursue neuroimaging for the weakness and/or delirium.  Patient remains confused today.  She is oriented to person and her date of birth.  She is unable to recall place, time, and current president.  Objective:  Vital signs in last 24 hours: Vitals:   08/02/17 0700 08/02/17 0754 08/02/17 0800 08/02/17 0931  BP: (!) 171/67 (!) 167/67 (!) 167/68 (!) 168/66  Pulse: 79 81 80   Resp: (!) 23  (!) 23   Temp:   98.6 F (37 C)   TempSrc:   Oral   SpO2: 98%  99%   Weight:      Height:       General: resting in bed, calm, comfortable on nasal cannula HEENT: NCAT, EOMI, no scleral icterus Cardiac: RRR, no murmurs Pulm: even and non-labored.  Diminished at bases with scattered crackles. No wheezes Abd: soft, nontender, nondistended, BS present Ext: warm and well perfused, SCD in place MSK: she is able to follow commands.  Overall, I do not appreciate and focal weakness on the right versus left.  She appears globally weak with strength testing Neuro: She is confused, but is alert and oriented to person and DOB.  Not oriented to place or year. Her cranial nerves II-XII grossly intact  Skin: + left subclavian CVC  Assessment/Plan:  Acute Hypoxic respiratory failure 2/2 to multilobar pneumonia Multilobar PNA L >R  -Continue pulmonary hygiene, incentive spirometry -Continue bronchodilators PRN -Continue  antibiotics, planning for 8 days Meropenem per PCCM notes -Avoid oversedation - Will need repeat CXR in 4-6 weeks  Brief PEA arrest following intubation. Bradycardic rhythm - < 414mns Mild troponin leak 5/11 -felt to be demand ischemia per Cards Hx CAD and diastolic HF  - Shock resolved , off pressors 5/10  HFpEF with Echo 5/11 showing EF preserved, MV /TV mod regurg, PAP 673mg  Hx HTN with elevated BP today  -Continue telemetry -Holding aspirin and Plavix can likely resume tomorrow per PCCM notes (was on hold related to -bloody  secretions) -Continue home Norvasc, Coreg -Resume home Losartan -Holding Lasix, does not appear volume up right now  Chronic renal failure w/ acute on chronic renal failure (baseline sCr 1.7-2.3) Hypernatremia Hypokalemia - Continue D5W for now, sodium improved today - Potassium replaced today - Renal function improved. Follow BMET  Encephalopathy  Suspect this is mostly driven by ICU delirium and lack of sleep.  She also had a brief less than 3 minute PEA arrest and hypotension that could have exacerbated her neurologic symptoms.  Anticipate this should improve as she re acclimates to a normal sleep-wake cycle.  We are currently holding her hydroxyzine and other sedating meds due not wanting to exacerbate this further - Avoid oversedation, holding hydroxyzine - Delirium precautions - If symptoms continue to prevent her from sleep could consider a low dose Risperdal or Seroquel.  Right sided weakness Family is concerned regarding this and would like to pursue imaging.  On my exam today there is no obvious right sided weakness and overnight nurse had reported similar improvement.  She is overall very weak and will need intensive rehabilitation - CIR consult placed - PT, OT follow up  Anemia of chronic disease  No evidence of active bleeding currently, hemoglobin yesterday stable at 9.1 -Trend CBC - Transfuse for hemoglobin less than 7 - Hep Sq for VTE ppx - Resume aspirin, Plavix tomorrow  Diabetes with hyperglycemia Blood sugars have been acceptable on current regimen - Lantus 15 units daily - Continue Novolog plus sliding scale  Illeus 5/14 Constipation - Liquid diet per SLP - Oral care BID  Stage IA right invasive ductal breast carcinoma, ER/PR+, HER2- s/p R breast lumpectomy and XRT in 2014. Now on daily anastrozole 14m4m- Holding anastrozole for now  Dispo: Anticipated discharge in approximately 2-4 day(s).   WalJule SerO 08/02/2017, 9:33 AM Pager:  [phone]

## 2017-08-02 NOTE — Progress Notes (Signed)
  Speech Language Pathology Treatment: Dysphagia  Patient Details Name: [name] MRN: [id number] DOB: 1945-04-25 Today's Date: 08/02/2017 Time: 0962-8366 SLP Time Calculation (min) (ACUTE ONLY): 13 min  Assessment / Plan / Recommendation Clinical Impression  Pt consumed purees and thin liquids with only Min-Mod cues provided today for oral holding with purees. Her bolus formation and posterior transit remain generally slow. Recommend advancement to Dys 1 diet and thin liquids to facilitate oral preparation while allowing for more nutritional content. SLP will f/u for tolerance and readiness to advance pending improved mentation and mental status.   HPI HPI: Pt is a 72 year old female who was admitted to [facility] 5/7 with hypoxia and pleuritic chest pain. She had a decline in respiratory status 5/8 with brief PEA arrest f/b cardiogenic and septic shock requiring pressors. ETT 5/8-5/15. PMH includes CAD, CHF, CKD, DM, HTN, Stage IA right invasive ductal breast carcinoma, lupus. No prior swallow evaluations documented, although review of prior documentation suggests chronic difficulty swallowing pills.      SLP Plan  Continue with current plan of care       Recommendations  Diet recommendations: Dysphagia 1 (puree);Thin liquid Liquids provided via: Cup;Straw Medication Administration: Crushed with puree Supervision: Staff to assist with self feeding;Full supervision/cueing for compensatory strategies Compensations: Minimize environmental distractions;Slow rate;Small sips/bites Postural Changes and/or Swallow Maneuvers: Seated upright 90 degrees                Oral Care Recommendations: Oral care BID Follow up Recommendations: Inpatient Rehab SLP Visit Diagnosis: Dysphagia, oral phase (R13.11) Plan: Continue with current plan of care       GO                [name] 08/02/2017, 3:24 PM  [name], M.A. CCC-SLP [phone]

## 2017-08-02 NOTE — Progress Notes (Signed)
Nutrition Follow-up  DOCUMENTATION CODES:   Obesity unspecified  INTERVENTION:    Premier Protein BID, each supplement provides 160 kcal and 30 gm protein.  NUTRITION DIAGNOSIS:   Inadequate oral intake related to lethargy/confusion, decreased appetite as evidenced by per patient/family report.  Ongoing  GOAL:   Patient will meet greater than or equal to 90% of their needs  Unmet  MONITOR:   Supplement acceptance, Labs, Diet advancement  ASSESSMENT:   72 yo female admitted with hypoxia and pleuritic chest pain on 5/7. On 5/8, pt with decline in respiratory status, rapid response called with PEA arrest, pt intubated, in septic and caridogenic shock requiring pressors ; pt with hx of CAD, CHF, CKD, DM, HTN, Stage IA right invasive ductal breast carcinoma, ER/PR+, HER2- s/p R breast lumpectomy and XRT in 2014 (declined adjuvant chemotherapy. S/p adjuvant breast radiation in 2015. Currently on anastrozole), lupus  Spoke with patient and her daughter. Patient is somewhat confused. Daughter reports that patient has had 1 1/2 bottles of Premier Protein clear supplement (brought from home) since last night. They also drink the milky Premier Protein supplement at home. Intake of meals has been poor. SLP following for ability to advance diet. Just advanced to full liquids yesterday. Patient was holding purees in her mouth during SLP evaluation, so unable to safely take any solid/pureed foods currently.   Labs and medications reviewed. Sodium 148 (H), potassium 3.1 (L) CBG's: 145-156 this morning   Diet Order:   Diet Order           Diet full liquid Room service appropriate? Yes; Fluid consistency: Thin  Diet effective now          EDUCATION NEEDS:   No education needs have been identified at this time  Skin:  Skin Assessment: Reviewed RN Assessment  Last BM:  5/17  Height:   Ht Readings from Last 1 Encounters:  07/23/17 _0  (1.626 m)    Weight:   Wt Readings from  Last 1 Encounters:  08/02/17 180 lb 1.9 oz (81.7 kg)    Ideal Body Weight:  54.5 kg  BMI:  Body mass index is 30.92 kg/m.  Estimated Nutritional Needs:   Kcal:  1555-1730 (18-20 kcal/kg)  Protein:  105-120 grams (1.2-1.4 grams/kg)  Fluid:  >/= 1.5 L    Molli Barrows, RD, LDN, [place] Pager [phone] After Hours Pager [phone]

## 2017-08-03 ENCOUNTER — Inpatient Hospital Stay (HOSPITAL_COMMUNITY): Payer: Medicare Other

## 2017-08-03 LAB — CBC
HCT: 29.6 % — ABNORMAL LOW (ref 36.0–46.0)
Hemoglobin: 9.3 g/dL — ABNORMAL LOW (ref 12.0–15.0)
MCH: 25.7 pg — ABNORMAL LOW (ref 26.0–34.0)
MCHC: 31.4 g/dL (ref 30.0–36.0)
MCV: 81.8 fL (ref 78.0–100.0)
Platelets: 213 10*3/uL (ref 150–400)
RBC: 3.62 MIL/uL — ABNORMAL LOW (ref 3.87–5.11)
RDW: 17.1 % — AB (ref 11.5–15.5)
WBC: 19.6 10*3/uL — AB (ref 4.0–10.5)

## 2017-08-03 LAB — BASIC METABOLIC PANEL
Anion gap: 10 (ref 5–15)
BUN: 45 mg/dL — AB (ref 6–20)
CHLORIDE: 114 mmol/L — AB (ref 101–111)
CO2: 25 mmol/L (ref 22–32)
CREATININE: 1.2 mg/dL — AB (ref 0.44–1.00)
Calcium: 9.2 mg/dL (ref 8.9–10.3)
GFR calc Af Amer: 52 mL/min — ABNORMAL LOW (ref >60)
GFR, EST NON AFRICAN AMERICAN: 45 mL/min — AB (ref >60)
Glucose, Bld: 166 mg/dL — ABNORMAL HIGH (ref 65–99)
Potassium: 3.2 mmol/L — ABNORMAL LOW (ref 3.5–5.1)
SODIUM: 149 mmol/L — AB (ref 135–145)

## 2017-08-03 LAB — GLUCOSE, CAPILLARY
GLUCOSE-CAPILLARY: 174 mg/dL — AB (ref 65–99)
GLUCOSE-CAPILLARY: 218 mg/dL — AB (ref 65–99)
Glucose-Capillary: 144 mg/dL — ABNORMAL HIGH (ref 65–99)
Glucose-Capillary: 163 mg/dL — ABNORMAL HIGH (ref 65–99)
Glucose-Capillary: 175 mg/dL — ABNORMAL HIGH (ref 65–99)
Glucose-Capillary: 210 mg/dL — ABNORMAL HIGH (ref 65–99)

## 2017-08-03 MED ORDER — LOSARTAN POTASSIUM 50 MG PO TABS
100.0000 mg | ORAL_TABLET | Freq: Every day | ORAL | Status: DC
  Administered 2017-08-04 – 2017-08-06 (×3): 100 mg via ORAL
  Filled 2017-08-03 (×3): qty 2

## 2017-08-03 MED ORDER — SODIUM CHLORIDE 0.9% FLUSH: 10.0000 mL | INTRAVENOUS | Status: DC | PRN

## 2017-08-03 MED ORDER — HYDRALAZINE HCL 20 MG/ML IJ SOLN: 10.0000 mg | Freq: Four times a day (QID) | INTRAMUSCULAR | Status: DC | PRN

## 2017-08-03 MED ORDER — HYDROXYZINE HCL 10 MG PO TABS
10.0000 mg | ORAL_TABLET | Freq: Every day | ORAL | Status: DC
  Administered 2017-08-03 – 2017-08-05 (×3): 10 mg via ORAL
  Filled 2017-08-03 (×3): qty 1

## 2017-08-03 MED ORDER — FAMOTIDINE 40 MG/5ML PO SUSR
20.0000 mg | Freq: Every day | ORAL | Status: DC
  Administered 2017-08-03 – 2017-08-06 (×4): 20 mg via ORAL
  Filled 2017-08-03 (×4): qty 2.5

## 2017-08-03 MED ORDER — CARVEDILOL 12.5 MG PO TABS
12.5000 mg | ORAL_TABLET | Freq: Two times a day (BID) | ORAL | Status: DC
  Administered 2017-08-03 – 2017-08-06 (×8): 12.5 mg via ORAL
  Filled 2017-08-03 (×8): qty 1

## 2017-08-03 MED ORDER — POTASSIUM CHLORIDE CRYS ER 20 MEQ PO TBCR
40.0000 meq | EXTENDED_RELEASE_TABLET | Freq: Once | ORAL | Status: AC
  Administered 2017-08-03: 40 meq via ORAL
  Filled 2017-08-03: qty 2

## 2017-08-03 MED ORDER — HYDRALAZINE HCL 20 MG/ML IJ SOLN
10.0000 mg | Freq: Four times a day (QID) | INTRAMUSCULAR | Status: DC | PRN
  Administered 2017-08-03: 13:00:00 via INTRAVENOUS
  Administered 2017-08-04 – 2017-08-05 (×2): 10 mg via INTRAVENOUS
  Filled 2017-08-03 (×3): qty 1

## 2017-08-03 NOTE — Progress Notes (Signed)
PHARMACIST - PHYSICIAN COMMUNICATION  CONCERNING: IV Pepcid to Oral Route Change Policy  RECOMMENDATION: This patient is receiving Pepcid 58m by the intravenous route.  Based on criteria approved by the Pharmacy and Therapeutics Committee, the intravenous medication(s) is/are being converted to the equivalent oral dose form(s).  DESCRIPTION: These criteria include:  The patient is eating (either orally or via tube) and/or has been taking other orally administered medications for a least 24 hours  The patient has no evidence of active gastrointestinal bleeding or impaired GI absorption (gastrectomy, short bowel, patient on TNA or NPO).  Last BM was 5/17  If you have questions about this conversion, please contact the Pharmacy Department  []   [phone])  [facility] Na[]   ([phone])  [facility][x]   [phone])  [facility][]   [phone])  [facility]   [phone])  [facility]   08/03/2017 1:34 PM  NRober Minion PharmD., MS Clinical Pharmacist Pager:  [phone] you for allowing pharmacy to be part of this patients care team.

## 2017-08-03 NOTE — Progress Notes (Signed)
PROGRESS NOTE    Katie [name]  ZOX:[id number] DOB: 21-Nov-1945 DOA: 07/23/2017 PCP: Katie Crews, MD  Brief Narrative:72 year old woman with PMHx significant for breast cancer (on anastrozole), CAD s/p DES, HFpEF, CKD, DM, HTN prior CVA per family report who was initially admitted to [facility] directly from clinic due to hypoxic respiratory failure 2/2 multilobar community acquired pneumonia.  She deteriorated overnight the evening of her admission and the following morning was subsequently intubated and transferred to the MICU.  She had bronchoscopy by Dr Nelda Marseille on 5/8 and there was concern for alveolar hemmorhage based on the findings.  Following intubation, she had junctional bradycardia with brief PEA arrest.  This further required 3 pressors for her septic shock.  She was able to come off pressers about 2 days later on 5/10.  On 5/13 she developed an SBO which [name] since improved and nursing reported large bowel movements since.  Her culture data [name] returned negative.  She also had an autoimmune work up that was negative.   Ultimately, she was extubated to nasal cannula on 5/15.  Since extubation, she [name] continued to have delirium here in the ICU and is globally weak and deconditioned.  PT/OT evaluations are recommending CIR.  There was concern regarding right sided weakness however, nursing reports this [name] improved.  Subjective:  Patient seen and examined this AM at the bedside.  Her grand daughter is present.  I also spoke on the phone with patients daughter Katie [name]).  I went back to the room later in the morning and also spoke to patients other daughter while Katie [name] was on the phone.  The family [name] requested that [facility] hospitalist take over the care of their mother.  I have contacted [facility] and they will assume care tomorrow 5/18.  The family was updated regarding this.      Assessment & Plan:   Active Problems:   SOB (shortness of breath)   Chest pain   Pneumonia of left lung due  to infectious organism   Acute respiratory failure with hypoxemia (HCC)   Cardiac arrest, cause unspecified (HCC)   Shock circulatory (HCC)   Septic shock ([place])   HCAP (healthcare-associated pneumonia)   Diffuse pulmonary alveolar hemorrhage  1]Acute Hypoxic respiratory failure 2/2 to multilobar pneumonia Multilobar PNA L >R  -Continue pulmonary hygiene, incentive spirometry -Continue bronchodilators PRN -Continue antibiotics, planning for 8 days Meropenem per PCCM.  Follow-up chest x-ray today. Leukocytosis improved.  2] PEA brief following intubation  3] diastolic heart failure with preserved ejection fraction from echo 07/27/2017-aspirin and Plavix is on hold at this time.  Reviewed notes from cardiology from 07/28/2017 patient had pulmonary hemorrhage so this is being held at this time please reevaluate early next week whether we should start her back on aspirin and Plavix.  4] hypertension continue Norvasc and Coreg and losartan.  Lasix is on hold at this time.  5] acute on chronic CKD baseline creatinine 1.7-2.3  6] hyper natremia-sodium 149 patient is on a D5 W drip at this time  7] delirium-treat symptomatically.doxepin for sleep.  Avoid antipsychotics.  8] generalized weakness-CIR consult in progress  9] anemia of chronic disease  stable  10] type 2 diabetes on Lantus 15 units nightly  11] stage Ia right  ductal breast carcinoma ER PR positive status post right breast lumpectomy and XRT in 2014 who was on anastrozole but [name] been stopped during this hospital stay per cardiology due to increased cardiac events from anastrozole.  12] ileus follow-up KUB today.  DC Reglan  13] hypokalemia K of 3.2 replete.    DVT prophylaxis: Not on any anticoagulation due to concern for pulmonary hemorrhage but will put her on SCD boots. Code Status:full Family Communication: Discussed with daughter Katie [name] [phone].  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 Disposition Plan: tbd Consultants:  pccm transfer Procedures: intubated  Antimicrobials:meropenem  Subjective:resting in bed in nad.   Objective: Vitals:   08/03/17 0408 08/03/17 0542 08/03/17 0900 08/03/17 1244  BP: (!) 169/64  (!) 176/75 (!) 185/67  Pulse: 80 81  85  Resp: 18   18  Temp: 98.8 F (37.1 C)   99 F (37.2 C)  TempSrc: Oral   Oral  SpO2: 98% 95%  94%  Weight: 81.2 kg (179 lb)     Height:        Intake/Output Summary (Last 24 hours) at 08/03/2017 1304 Last data filed at 08/03/2017 1100 Gross per 24 hour  Intake 1422.25 ml  Output 1200 ml  Net 222.25 ml   Filed Weights   08/02/17 0500 08/02/17 1737 08/03/17 0408  Weight: 81.7 kg (180 lb 1.9 oz) 82.2 kg (181 lb 3.5 oz) 81.2 kg (179 lb)    Examination:  General exam: Appears calm and comfortable  Respiratory system: Clear to auscultation. Respiratory effort normal. Cardiovascular system: S1 & S2 heard, RRR. No JVD, murmurs, rubs, gallops or clicks. No pedal edema. Gastrointestinal system: Abdomen is nondistended, soft and nontender. No organomegaly or masses felt. Normal bowel sounds heard. Central nervous system: Alert and oriented. No focal neurological deficits. Extremities: Symmetric 5 x 5 power. Skin: No rashes, lesions or ulcers Psychiatry: Judgement and insight appear normal. Mood & affect appropriate.     Data Reviewed: I have personally reviewed following labs and imaging studies  CBC: Recent Labs  Lab 07/29/17 0637 07/30/17 0550 07/31/17 0434 08/01/17 0500 08/03/17 0547  WBC 32.9* 33.3* 29.4* 31.9* 19.6*  HGB 8.8* 8.7* 8.9* 9.1* 9.3*  HCT 27.0* 27.5* 28.9* 29.5* 29.6*  MCV 78.3 79.7 82.1 81.0 81.8  PLT 210 189 187 209 098   Basic Metabolic Panel: Recent Labs  Lab 07/28/17 0430 07/29/17 0637 07/30/17 0550 07/31/17 0434 08/01/17 0500 08/02/17 0405 08/03/17 0547  NA 147* 148* 150* 149* 150* 148* 149*  K 4.1 3.9 4.0 3.6 3.1* 3.1* 3.2*  CL 108 106 103 105 110  113* 114*  CO2 27 31 33* _0 GLUCOSE 226* 248* 179* 297* 216* 172* 166*  BUN 101* 91* 98* 101* 81* 63* 45*  CREATININE 2.37* 1.97* 1.94* 1.91* 1.67* 1.38* 1.20*  CALCIUM 8.2* 9.3 9.4 9.4 9.5 9.4 9.2  MG 2.1 1.7 2.8* 2.5* 2.2 2.2  --   PHOS 2.9 2.3* 5.0*  5.0* 4.1 2.8  --   --    GFR: Estimated Creatinine Clearance: 45 mL/min (A) (by C-G formula based on SCr of 1.2 mg/dL (H)). Liver Function Tests: Recent Labs  Lab 07/30/17 0550 07/31/17 0434  ALBUMIN 1.8* 1.7*   No results for input(s): LIPASE, AMYLASE in the last 168 hours. No results for input(s): AMMONIA in the last 168 hours. Coagulation Profile: No results for input(s): INR, PROTIME in the last 168 hours. Cardiac Enzymes: Recent Labs  Lab 07/28/17 1501  CKTOTAL 1,147*  CKMB 4.2   BNP (last 3 results) No results for input(s): PROBNP in the last 8760 hours. HbA1C: No results for input(s): HGBA1C in the last 72 hours. CBG:  Recent Labs  Lab 08/02/17 2043 08/02/17 2314 08/03/17 0404 08/03/17 0741 08/03/17 1241  GLUCAP 226* 230* 163* 144* 174*   Lipid Profile: No results for input(s): CHOL, HDL, LDLCALC, TRIG, CHOLHDL, LDLDIRECT in the last 72 hours. Thyroid Function Tests: No results for input(s): TSH, T4TOTAL, FREET4, T3FREE, THYROIDAB in the last 72 hours. Anemia Panel: No results for input(s): VITAMINB12, FOLATE, FERRITIN, TIBC, IRON, RETICCTPCT in the last 72 hours. Sepsis Labs: Recent Labs  Lab 07/28/17 0430 07/29/17 0637 07/31/17 0434  PROCALCITON 55.66 35.44 17.27  LATICACIDVEN 1.8  --   --     Recent Results (from the past 240 hour(s))  Culture, bal-quantitative     Status: None   Collection Time: 07/24/17  1:54 PM  Result Value Ref Range Status   Specimen Description BRONCHIAL WASHINGS  Final   Special Requests Immunocompromised  Final   Gram Stain   Final    FEW WBC PRESENT, PREDOMINANTLY PMN NO ORGANISMS SEEN    Culture   Final    NO GROWTH 2 DAYS Performed at [facility], [place] [address] [place], [place] [address]    Report Status 07/26/2017 FINAL  Final  Pneumocystis smear by DFA     Status: None   Collection Time: 07/24/17  1:54 PM  Result Value Ref Range Status   Specimen Source-PJSRC BRONCHIAL WASHINGS  Final   Pneumocystis jiroveci Ag NEGATIVE  Final    Comment: Performed at [facility] Performed at [facility], [address] [place], [place] [address]   Respiratory Panel by PCR     Status: None   Collection Time: 07/27/17  2:37 PM  Result Value Ref Range Status   Adenovirus NOT DETECTED NOT DETECTED Final   Coronavirus 229E NOT DETECTED NOT DETECTED Final   Coronavirus HKU1 NOT DETECTED NOT DETECTED Final   Coronavirus NL63 NOT DETECTED NOT DETECTED Final   Coronavirus OC43 NOT DETECTED NOT DETECTED Final   Metapneumovirus NOT DETECTED NOT DETECTED Final   Rhinovirus / Enterovirus NOT DETECTED NOT DETECTED Final   Influenza A NOT DETECTED NOT DETECTED Corrected   Influenza B NOT DETECTED NOT DETECTED Final   Parainfluenza Virus 1 NOT DETECTED NOT DETECTED Final   Parainfluenza Virus 2 NOT DETECTED NOT DETECTED Final   Parainfluenza Virus 3 NOT DETECTED NOT DETECTED Final   Parainfluenza Virus 4 NOT DETECTED NOT DETECTED Final   Respiratory Syncytial Virus NOT DETECTED NOT DETECTED Final   Bordetella pertussis NOT DETECTED NOT DETECTED Final   Chlamydophila pneumoniae NOT DETECTED NOT DETECTED Final   Mycoplasma pneumoniae NOT DETECTED NOT DETECTED Final    Comment: Performed at [facility], [place] [address] [place], [place] [address]  Culture, respiratory (NON-Expectorated)     Status: None   Collection Time: 07/28/17 11:26 AM  Result Value Ref Range Status   Specimen Description TRACHEAL ASPIRATE  Final   Special Requests NONE  Final   Gram Stain   Final    ABUNDANT WBC PRESENT,BOTH PMN AND MONONUCLEAR NO SQUAMOUS EPITHELIAL CELLS SEEN NO ORGANISMS SEEN    Culture   Final    FEW Consistent with normal  respiratory flora. Performed at [facility], [place] [address] [place],  [address]    Report Status 07/30/2017 FINAL  Final  Culture, Urine     Status: None   Collection Time: 07/28/17  1:11 PM  Result Value Ref Range Status   Specimen Description URINE, CLEAN CATCH  Final  Special Requests NONE  Final   Culture   Final    NO GROWTH Performed at [facility], [place] [address] [place], [place] [address]    Report Status 07/29/2017 FINAL  Final  Culture, blood (Routine X 2) w Reflex to ID Panel     Status: None   Collection Time: 07/28/17  3:01 PM  Result Value Ref Range Status   Specimen Description BLOOD LEFT ARM  Final   Special Requests   Final    BOTTLES DRAWN AEROBIC AND ANAEROBIC Blood Culture adequate volume   Culture   Final    NO GROWTH 5 DAYS Performed at [facility], [place] [address], [place], [place] [address]    Report Status 08/02/2017 FINAL  Final  Culture, blood (Routine X 2) w Reflex to ID Panel     Status: None   Collection Time: 07/28/17  3:25 PM  Result Value Ref Range Status   Specimen Description BLOOD LEFT HAND  Final   Special Requests   Final    BOTTLES DRAWN AEROBIC AND ANAEROBIC Blood Culture adequate volume   Culture   Final    NO GROWTH 5 DAYS Performed at [facility], [place] [address] [place], [place] [address]    Report Status 08/02/2017 FINAL  Final         Radiology Studies: Dg Swallowing Func-speech Pathology  Result Date: 08/01/2017 Objective Swallowing Evaluation: Type of Study: MBS-Modified Barium Swallow Study  Patient Details Name: PRESSLEY BARSKY MRN: [id number] Date of Birth: September 28, 1945 Today's Date: 08/01/2017 Time: SLP Start Time (ACUTE ONLY): 1345 -SLP Stop Time (ACUTE ONLY): 1359 SLP Time Calculation (min) (ACUTE ONLY): 14 min Past Medical History: Past Medical History: Diagnosis Date . Abdominal discomfort   Chronic N/V/D. Presumptive dx Crohn's dx per elevated p ANCA. Failed Entocort and Pentasa. Sep  2003 - ileocolectomy c anastomosis per Dr Deon Pilling 2/2 adhesions - path was hegative for Crohns. EGD, Sm bowel follow through (11/03), and an eteroclysis (10/03) were unrevealing. Cuases hypomag and hypocalcemia. . Adnexal mass 8/03  s/p lap BSO (R ovarian fibroma) & lysis of adhesions . Allergy   Seasonal . Anemia   Multifactorial. Baseline HgB 10-11 ish. B12 def - 150 in 3/10. Fe Def - ferritin 35 3/10. Both are being repleted. . Breast cancer ([place]) 03/16/13  right, 5 o'clock . CAD (coronary artery disease) 1996  1996 - PTCA and angioplasty diagonal branch. 2000 - Rotoblator & angiopllasty of diagonal. 2006 - subendocardial AMI, DES to proximal LAD.Marland Kitchen Also had 90% stenosis in distal apical LAD. EF 55 with apical hypokinesis. Indefinite ASA and Plavix. . CHF (congestive heart failure) ([place])  . Chronic kidney disease   Chronic renal insuff baseline Cr 1.2 - 1.4 ish. . Chronic pain   CT 10/10 = Spinal stenosis L2 - S1. . Diabetes mellitus   Insulin dependent . Hx of radiation therapy 06/02/13- 07/16/13  right rbeast 4500 cGy 25 sessions, right breast boost 1600 cGy in 8 sessions . Hyperlipidemia   Managed with both a statin and Welchol. Welchol stopped 2014 2/2 cost and started on fenofibrate  . Hypertension   2006 B renal arteries patent. 2003 MRA - no RAS. 2003 pheo W/U Dr Hassell Done reportedly negative. . Hypoxia 07/23/2017 . Lupus ([place])  . Personal history of radiation therapy  . RBBB  . Stroke [facility])   Incidental finding MRI 2002 L lacunar infarct . Wears dentures   top Past Surgical History: Past Surgical History: Procedure  Laterality Date . ABDOMINAL HYSTERECTOMY   . BILATERAL SALPINGOOPHORECTOMY  8/03  Lap BSO (R ovarian fibroma) and adhesion lysis . BOWEL RESECTION  2003  ileocolectomy with anastomosis 2/2 adhesions . BREAST BIOPSY   . BREAST LUMPECTOMY Right  . BREAST LUMPECTOMY WITH NEEDLE LOCALIZATION AND AXILLARY SENTINEL LYMPH NODE BX Right 04/22/2013  Procedure: BREAST LUMPECTOMY WITH NEEDLE LOCALIZATION AND AXILLARY  SENTINEL LYMPH NODE BX;  Surgeon: Stark Klein, MD;  Location: [place];  Service: General;  Laterality: Right; . CARDIAC CATHETERIZATION    2 stents . CHOLECYSTECTOMY   . HEMICOLECTOMY    R sided hemicolectomy . HERNIA REPAIR    Ventral hernia repair . PTCA  4/06 HPI: Pt is a 72 year old female who was admitted to [facility] 5/7 with hypoxia and pleuritic chest pain. She had a decline in respiratory status 5/8 with brief PEA arrest f/b cardiogenic and septic shock requiring pressors. ETT 5/8-5/15. PMH includes CAD, CHF, CKD, DM, HTN, Stage IA right invasive ductal breast carcinoma, lupus. No prior swallow evaluations documented, although review of prior documentation suggests chronic difficulty swallowing pills.  Subjective: pt alert, needs cues to follow commands Assessment / Plan / Recommendation CHL IP CLINICAL IMPRESSIONS 08/01/2017 Clinical Impression Pt [name] a mild-moderate oral dysphagia that appears cognitively based, otherwise with functional pharyngeal response. She [name] adequate oral function with liquids but when given bites of puree she exhibits oral holding. When cued to swallow it is piecemeal, and mild lingual residue persists even after the rest of the bolus is cleared. Mod cues from SLP and liquid washes assisted with oral clearance. For today would start with a full liquid diet. SLP will f/u for tolerance and readiness to advance. SLP Visit Diagnosis Dysphagia, oral phase (R13.11) Attention and concentration deficit following -- Frontal lobe and executive function deficit following -- Impact on safety and function Mild aspiration risk   CHL IP TREATMENT RECOMMENDATION 08/01/2017 Treatment Recommendations Therapy as outlined in treatment plan below   Prognosis 08/01/2017 Prognosis for Safe Diet Advancement Good Barriers to Reach Goals Cognitive deficits Barriers/Prognosis Comment -- CHL IP DIET RECOMMENDATION 08/01/2017 SLP Diet Recommendations Thin liquid;Other (Comment) Liquid  Administration via Cup;Straw Medication Administration Crushed with puree Compensations Minimize environmental distractions;Slow rate;Small sips/bites Postural Changes Seated upright at 90 degrees   CHL IP OTHER RECOMMENDATIONS 08/01/2017 Recommended Consults -- Oral Care Recommendations Oral care BID Other Recommendations --   CHL IP FOLLOW UP RECOMMENDATIONS 08/01/2017 Follow up Recommendations (No Data)   CHL IP FREQUENCY AND DURATION 08/01/2017 Speech Therapy Frequency (ACUTE ONLY) min 2x/week Treatment Duration 2 weeks      CHL IP ORAL PHASE 08/01/2017 Oral Phase Impaired Oral - Pudding Teaspoon -- Oral - Pudding Cup -- Oral - Honey Teaspoon -- Oral - Honey Cup -- Oral - Nectar Teaspoon -- Oral - Nectar Cup -- Oral - Nectar Straw -- Oral - Thin Teaspoon WFL Oral - Thin Cup WFL Oral - Thin Straw WFL Oral - Puree Holding of bolus;Piecemeal swallowing;Lingual/palatal residue Oral - Mech Soft -- Oral - Regular -- Oral - Multi-Consistency -- Oral - Pill -- Oral Phase - Comment --  CHL IP PHARYNGEAL PHASE 08/01/2017 Pharyngeal Phase WFL Pharyngeal- Pudding Teaspoon -- Pharyngeal -- Pharyngeal- Pudding Cup -- Pharyngeal -- Pharyngeal- Honey Teaspoon -- Pharyngeal -- Pharyngeal- Honey Cup -- Pharyngeal -- Pharyngeal- Nectar Teaspoon -- Pharyngeal -- Pharyngeal- Nectar Cup -- Pharyngeal -- Pharyngeal- Nectar Straw -- Pharyngeal -- Pharyngeal- Thin Teaspoon -- Pharyngeal -- Pharyngeal- Thin Cup -- Pharyngeal --  Pharyngeal- Thin Straw -- Pharyngeal -- Pharyngeal- Puree -- Pharyngeal -- Pharyngeal- Mechanical Soft -- Pharyngeal -- Pharyngeal- Regular -- Pharyngeal -- Pharyngeal- Multi-consistency -- Pharyngeal -- Pharyngeal- Pill -- Pharyngeal -- Pharyngeal Comment --  CHL IP CERVICAL ESOPHAGEAL PHASE 08/01/2017 Cervical Esophageal Phase WFL Pudding Teaspoon -- Pudding Cup -- Honey Teaspoon -- Honey Cup -- Nectar Teaspoon -- Nectar Cup -- Nectar Straw -- Thin Teaspoon -- Thin Cup -- Thin Straw -- Puree -- Mechanical Soft --  Regular -- Multi-consistency -- Pill -- Cervical Esophageal Comment -- No flowsheet data found. [name] 08/01/2017, 2:39 PM  [name], M.A. CCC-SLP [phone]                  Scheduled Meds: . amLODipine  10 mg Oral Daily  . carvedilol  12.5 mg Oral BID WC  . heparin injection (subcutaneous)  5,000 Units Subcutaneous Q8H  . insulin aspart  0-20 Units Subcutaneous Q4H  . insulin aspart  3 Units Subcutaneous Q4H  . insulin glargine  15 Units Subcutaneous Daily  . [START ON 08/04/2017] losartan  100 mg Oral Daily  . mouth rinse  15 mL Mouth Rinse BID  . metoCLOPramide (REGLAN) injection  5 mg Intravenous Q6H  . potassium chloride  40 mEq Oral Daily  . protein supplement shake  11 oz Oral BID BM   Continuous Infusions: . dextrose 55 mL/hr at 08/03/17 0733  . famotidine (PEPCID) IV Stopped (08/02/17 1502)  . meropenem (MERREM) IV Stopped (08/03/17 0049)     LOS: 10 days     Georgette Shell, MD [facility] If 7PM-7AM, please contact night-coverage [email or web address] Password [email or web address] 08/03/2017, 1:04 PM

## 2017-08-04 DIAGNOSIS — R042 Hemoptysis: Secondary | ICD-10-CM

## 2017-08-04 DIAGNOSIS — G9341 Metabolic encephalopathy: Secondary | ICD-10-CM

## 2017-08-04 DIAGNOSIS — L8990 Pressure ulcer of unspecified site, unspecified stage: Secondary | ICD-10-CM

## 2017-08-04 LAB — CBC WITH DIFFERENTIAL/PLATELET
Abs Immature Granulocytes: 0.4 10*3/uL — ABNORMAL HIGH (ref 0.0–0.1)
Basophils Absolute: 0 10*3/uL (ref 0.0–0.1)
Basophils Relative: 0 %
EOS ABS: 0.2 10*3/uL (ref 0.0–0.7)
Eosinophils Relative: 1 %
HEMATOCRIT: 31.3 % — AB (ref 36.0–46.0)
Hemoglobin: 9.6 g/dL — ABNORMAL LOW (ref 12.0–15.0)
Immature Granulocytes: 2 %
LYMPHS ABS: 1.5 10*3/uL (ref 0.7–4.0)
Lymphocytes Relative: 8 %
MCH: 24.7 pg — ABNORMAL LOW (ref 26.0–34.0)
MCHC: 30.7 g/dL (ref 30.0–36.0)
MCV: 80.7 fL (ref 78.0–100.0)
MONOS PCT: 4 %
Monocytes Absolute: 0.8 10*3/uL (ref 0.1–1.0)
Neutro Abs: 16 10*3/uL — ABNORMAL HIGH (ref 1.7–7.7)
Neutrophils Relative %: 85 %
Platelets: 229 10*3/uL (ref 150–400)
RBC: 3.88 MIL/uL (ref 3.87–5.11)
RDW: 17.1 % — AB (ref 11.5–15.5)
WBC: 18.9 10*3/uL — ABNORMAL HIGH (ref 4.0–10.5)

## 2017-08-04 LAB — GLUCOSE, CAPILLARY
GLUCOSE-CAPILLARY: 193 mg/dL — AB (ref 65–99)
Glucose-Capillary: 151 mg/dL — ABNORMAL HIGH (ref 65–99)
Glucose-Capillary: 143 mg/dL — ABNORMAL HIGH (ref 65–99)
Glucose-Capillary: 196 mg/dL — ABNORMAL HIGH (ref 65–99)
Glucose-Capillary: 230 mg/dL — ABNORMAL HIGH (ref 65–99)

## 2017-08-04 LAB — BASIC METABOLIC PANEL
Anion gap: 10 (ref 5–15)
BUN: 41 mg/dL — ABNORMAL HIGH (ref 6–20)
CALCIUM: 9.3 mg/dL (ref 8.9–10.3)
CO2: 23 mmol/L (ref 22–32)
CREATININE: 1.21 mg/dL — AB (ref 0.44–1.00)
Chloride: 113 mmol/L — ABNORMAL HIGH (ref 101–111)
GFR calc non Af Amer: 44 mL/min — ABNORMAL LOW (ref >60)
GFR, EST AFRICAN AMERICAN: 51 mL/min — AB (ref >60)
Glucose, Bld: 159 mg/dL — ABNORMAL HIGH (ref 65–99)
Potassium: 3.4 mmol/L — ABNORMAL LOW (ref 3.5–5.1)
Sodium: 146 mmol/L — ABNORMAL HIGH (ref 135–145)

## 2017-08-04 LAB — MAGNESIUM: Magnesium: 2 mg/dL (ref 1.7–2.4)

## 2017-08-04 LAB — C DIFFICILE QUICK SCREEN W PCR REFLEX
C DIFFICILE (CDIFF) INTERP: NOT DETECTED
C Diff antigen: NEGATIVE
C Diff toxin: NEGATIVE

## 2017-08-04 MED ORDER — ASPIRIN EC 81 MG PO TBEC
81.0000 mg | DELAYED_RELEASE_TABLET | Freq: Every day | ORAL | Status: DC
  Administered 2017-08-04 – 2017-08-06 (×3): 81 mg via ORAL
  Filled 2017-08-04 (×3): qty 1

## 2017-08-04 MED ORDER — QUETIAPINE FUMARATE 25 MG PO TABS
50.0000 mg | ORAL_TABLET | Freq: Every day | ORAL | Status: DC
  Administered 2017-08-04 – 2017-08-05 (×2): 50 mg via ORAL
  Filled 2017-08-04 (×2): qty 2

## 2017-08-04 MED ORDER — LOPERAMIDE HCL 2 MG PO CAPS
2.0000 mg | ORAL_CAPSULE | ORAL | Status: DC | PRN
Start: 2017-08-04 — End: 2017-08-06
  Administered 2017-08-04 – 2017-08-05 (×2): 2 mg via ORAL
  Filled 2017-08-04 (×2): qty 1

## 2017-08-04 MED ORDER — CLOPIDOGREL BISULFATE 75 MG PO TABS
75.0000 mg | ORAL_TABLET | Freq: Every day | ORAL | Status: DC
  Administered 2017-08-04 – 2017-08-06 (×3): 75 mg via ORAL
  Filled 2017-08-04 (×3): qty 1

## 2017-08-04 MED ORDER — ANASTROZOLE 1 MG PO TABS
1.0000 mg | ORAL_TABLET | Freq: Every day | ORAL | Status: DC
  Administered 2017-08-05 – 2017-08-06 (×2): 1 mg via ORAL
  Filled 2017-08-04 (×2): qty 1

## 2017-08-04 NOTE — Progress Notes (Signed)
Occupational Therapy Treatment Patient Details Name: [name] MRN: [id number] DOB: 07/07/1945 Today's Date: 08/04/2017    History of present illness 72 year old female with PMH as below, which is significant for Breast Ca (on anastrozole), CAD s/p DES, diastolic CHF, who was admitted to [facility] 5/7 with complaints of orthopnea and non-productive cough that have been progressive for 3 weeks. Acutely developed pleuritic chest pain,She was treated with CAP antibiotics, gentle IVF, and supplemental O2. Chest radiograph demonstrated large L sided infiltrate. Dx Acute Hypoxic respiratory failure- Multilobar PNA L >R 5/8 intubated and placed on 3 pressors, 5/10 off pressors    OT comments  Pt. And pts. Dtr. Who was present for session agreeable to ROM and massage education for LUE and hand.  Also reviewed recommended elevation and ice for edema management and reduction.  Dtr. Asking about compression sleeve for LUE.  Will pass along to see if indicated.    Follow Up Recommendations  CIR;Supervision/Assistance - 24 hour    Equipment Recommendations  3 in 1 bedside commode    Recommendations for Other Services Rehab consult    Precautions / Restrictions Precautions Precautions: Fall       Mobility Bed Mobility                  Transfers                      Balance                                           ADL either performed or assessed with clinical judgement   ADL                                               Vision       Perception     Praxis      Cognition                                                Exercises Hand Exercises Wrist Flexion: PROM;5 reps;Left;Supine Wrist Extension: PROM;Supine;Left;5 reps Digit Composite Flexion: PROM;Left;5 reps;Supine Composite Extension: PROM;Left;5 reps;Supine Digit Composite Abduction: PROM;Left;Supine Digit Composite Adduction:  PROM;Left;Supine Other Exercises Other Exercises: introduced retrograde massage for L digits, and hand.  dtr. present and educated also.  dtr. states okay to use their cocoa butter lotion vs. the hospital supplied lotion.  emphasis on direction of massage in efforts to minimize edema and skin tightness.  pt. tolerated well but had notable grimacing when massage reached towards the wrist.  reviewed rec. elevation on 2 pillows.  provided ice pack for LUE.     Shoulder Instructions       General Comments      Pertinent Vitals/ Pain       Pain Assessment: (grimacing during retrograde massage related to L wrist mostly)  Home Living                                          Prior  Functioning/Environment              Frequency  Min 2X/week        Progress Toward Goals  OT Goals(current goals can now be found in the care plan section)  Progress towards OT goals: Progressing toward goals     Plan      Co-evaluation                 AM-PAC PT "6 Clicks" Daily Activity     Outcome Measure   Help from another person eating meals?: Total Help from another person taking care of personal grooming?: A Lot Help from another person toileting, which includes using toliet, bedpan, or urinal?: Total Help from another person bathing (including washing, rinsing, drying)?: A Lot Help from another person to put on and taking off regular upper body clothing?: Total Help from another person to put on and taking off regular lower body clothing?: Total 6 Click Score: 8    End of Session    OT Visit Diagnosis: Other abnormalities of gait and mobility (R26.89);Muscle weakness (generalized) (M62.81);Other symptoms and signs involving cognitive function   Activity Tolerance Patient tolerated treatment well   Patient Left in bed;with call bell/phone within reach;with family/visitor present   Nurse Communication Other (comment)(dtr. states pt. has been incontinent of  bowel, noted leakage from bed to the floor, CNA notified)        Time: 4268-3419 OT Time Calculation (min): 27 min  Charges: OT General Charges $OT Visit: 1 Visit OT Treatments $Self Care/Home Management : 23-37 mins  Janice Coffin, COTA/L 08/04/2017, 8:59 AM

## 2017-08-04 NOTE — Progress Notes (Addendum)
[facility]  PROGRESS NOTE  [name] SWN:[id number] DOB: Apr 07, 1945 DOA: 07/23/2017 PCP: Bartholomew Crews, MD   Brief HPI:    72 year old woman with PMHx significant for breast cancer (on anastrozole), CAD s/p DES, HFpEF, CKD, DM, HTN prior CVA per family report who was initially admitted to [facility] directly from clinic due to hypoxic respiratory failure 2/2 multilobar community acquired pneumonia. She deteriorated overnight the evening of her admission and the following morning was subsequently intubated and transferred to the MICU. She had bronchoscopy by Dr Nelda Marseille on 5/8 and there was concern for alveolar hemmorhage based on the findings. Following intubation, she had junctional bradycardia with brief PEA arrest. This further required 3 pressors for her septic shock. She was able to come off pressers about 2 days later on 5/10. On 5/13 she developed an SBO which has since improved and nursing reported large bowel movements since. Her culture data has returned negative. She also had an autoimmune work up that was negative. Ultimately, she was extubated to nasal cannula on 5/15. Since extubation, she has continued to have delirium here in the ICU and is globally weak and deconditioned. PT/OT evaluations are recommending CIR. There was concern regarding right sided weakness however, nursing reports this has improved     Subjective   Patient seen and examined, continues to be pleasantly confused.   Assessment/Plan:     1. Acute hypoxic respiratory failure-secondary to multilobar pneumonia, mucous plugging.  Resolved.  Patient is on bronchodilators, antibiotics have been discontinued.  Completed 8 days of meropenem. 2. Brief PEA arrest following intubation-patient had bradycardic rhythm for less than 3 minutes.  Mild troponin leak on 07/27/2017.  Felt to be demand ischemia per cardiology. 3. History of CAD and diastolic heart failure-status post drug-eluting stent,  she has preserved ejection fraction from echo on 07/27/2017.  Aspirin and Plavix is currently on hold.  Note from cardiology from 07/28/2017, recommended to hold aspirin and Plavix until ARDS resolved.  Review of PCCM note from 08/01/2017 recommended to start Plavix and aspirin in the next 24 to 48 hours.  I will restart aspirin and Plavix at this time. 4. Acute encephalopathy-likely metabolic but patient may have had anoxic injury from brief less than 3 minutes cardiac arrest.  Patient will need rehab.  Failure has been consulted.  Will start Seroquel 50 mg p.o. nightly for hallucinations/agitation 5. Diabetes mellitus, type II, no acute complications-continue sliding scale insulin with NovoLog.  Continue Lantus. 6. Stage IV right invasive ductal breast carcinoma-status post right breast lumpectomy and XRT in 2014.  Anastrozole was held in the hospital.  Will restart anastrozole at this time. 7. Hypernatremia-sodium slowly improving, today 146.  Continue D5W 8. Chronic kidney disease stage III-creatinine has improved.  Today creatinine is 1.21.  Follow BMP in am. 9. Hypokalemia-potassium is 3.4, will replace potassium and check BMP in a.m.      DVT prophylaxis: SCDs, not on anticoagulation due to concern for pulmonary hemorrhage.  Code Status: Full code  Family Communication: No family at bedside  Disposition Plan: likely home when medically ready for discharge   Consultants:  PCCM  Procedures: 5/7 admit 5/8 started on BiPAP and transferred to SDU. Brief junctional bradycardia w/ brief PEA arrest f/b cardiogenic & septic shock 3 pressor dependent >Intubated 5/10 Off pressors      Antibiotics:   Anti-infectives (From admission, onward)   Start     Dose/Rate Route Frequency Ordered Stop   07/31/17 0100  meropenem (  MERREM) 1 g in sodium chloride 0.9 % 100 mL IVPB     1 g 200 mL/hr over 30 Minutes Intravenous Every 12 hours 07/30/17 1426 08/04/17 1425   07/28/17 1300  vancomycin  (VANCOCIN) IVPB 1000 mg/200 mL premix  Status:  Discontinued     1,000 mg 200 mL/hr over 60 Minutes Intravenous Every 24 hours 07/28/17 1147 07/28/17 1218   07/28/17 1300  meropenem (MERREM) 1 g in sodium chloride 0.9 % 100 mL IVPB  Status:  Discontinued     1 g 200 mL/hr over 30 Minutes Intravenous Every 12 hours 07/28/17 1222 07/30/17 1426   07/27/17 2200  levofloxacin (LEVAQUIN) IVPB 500 mg  Status:  Discontinued     500 mg 100 mL/hr over 60 Minutes Intravenous Every 48 hours 07/27/17 1342 07/28/17 1218   07/27/17 1800  vancomycin (VANCOCIN) IVPB 1000 mg/200 mL premix  Status:  Discontinued     1,000 mg 200 mL/hr over 60 Minutes Intravenous Every 24 hours 07/27/17 1232 07/27/17 1341   07/25/17 1700  vancomycin (VANCOCIN) IVPB 1000 mg/200 mL premix  Status:  Discontinued     1,000 mg 200 mL/hr over 60 Minutes Intravenous Every 24 hours 07/24/17 1425 07/27/17 1224   07/24/17 1500  vancomycin (VANCOCIN) 1,750 mg in sodium chloride 0.9 % 500 mL IVPB     1,750 mg 250 mL/hr over 120 Minutes Intravenous  Once 07/24/17 1351 07/24/17 1707   07/23/17 2200  azithromycin (ZITHROMAX) 500 mg in sodium chloride 0.9 % 250 mL IVPB  Status:  Discontinued     500 mg 250 mL/hr over 60 Minutes Intravenous Every 24 hours 07/23/17 1856 07/27/17 1341   07/23/17 1915  cefTRIAXone (ROCEPHIN) 2 g in sodium chloride 0.9 % 100 mL IVPB  Status:  Discontinued     2 g 200 mL/hr over 30 Minutes Intravenous Every 24 hours 07/23/17 1914 07/27/17 1341       Objective   Vitals:   08/04/17 0400 08/04/17 0438 08/04/17 0721 08/04/17 1137  BP: (!) 183/73 (!) 170/66 (!) 161/68 (!) 163/46  Pulse: 83 82 79 82  Resp: 18   18  Temp: 98.8 F (37.1 C)   98.3 F (36.8 C)  TempSrc: Oral   Oral  SpO2: 98%   99%  Weight: 81.6 kg (180 lb)     Height:        Intake/Output Summary (Last 24 hours) at 08/04/2017 1335 Last data filed at 08/04/2017 1000 Gross per 24 hour  Intake 1874.75 ml  Output 1000 ml  Net 874.75 ml    Filed Weights   08/02/17 1737 08/03/17 0408 08/04/17 0400  Weight: 82.2 kg (181 lb 3.5 oz) 81.2 kg (179 lb) 81.6 kg (180 lb)     Physical Examination:    General: Appears in no acute distress  Cardiovascular: S1-S2, RRR  Respiratory: Normal respiratory effort, decreased breath sounds at lung bases  Abdomen: Soft, nontender, no organomegaly.  Extremities: No edema of the lower extremities  Neurologic: Alert, oriented x1.  Unable to tell me month, year, place     Data Reviewed: I have personally reviewed following labs and imaging studies  CBG: Recent Labs  Lab 08/03/17 2007 08/03/17 2350 08/04/17 0406 08/04/17 0749 08/04/17 1135  GLUCAP 218* 175* 151* 143* 196*    CBC: Recent Labs  Lab 07/30/17 0550 07/31/17 0434 08/01/17 0500 08/03/17 0547 08/04/17 0445  WBC 33.3* 29.4* 31.9* 19.6* 18.9*  NEUTROABS  --   --   --   --  16.0*  HGB 8.7* 8.9* 9.1* 9.3* 9.6*  HCT 27.5* 28.9* 29.5* 29.6* 31.3*  MCV 79.7 82.1 81.0 81.8 80.7  PLT 189 187 209 213 546    Basic Metabolic Panel: Recent Labs  Lab 07/29/17 0637 07/30/17 0550 07/31/17 0434 08/01/17 0500 08/02/17 0405 08/03/17 0547 08/04/17 0445  NA 148* 150* 149* 150* 148* 149* 146*  K 3.9 4.0 3.6 3.1* 3.1* 3.2* 3.4*  CL 106 103 105 110 113* 114* 113*  CO2 31 33* 31 29 25 25 23   GLUCOSE 248* 179* 297* 216* 172* 166* 159*  BUN 91* 98* 101* 81* 63* 45* 41*  CREATININE 1.97* 1.94* 1.91* 1.67* 1.38* 1.20* 1.21*  CALCIUM 9.3 9.4 9.4 9.5 9.4 9.2 9.3  MG 1.7 2.8* 2.5* 2.2 2.2  --  2.0  PHOS 2.3* 5.0*  5.0* 4.1 2.8  --   --   --     Recent Results (from the past 240 hour(s))  Respiratory Panel by PCR     Status: None   Collection Time: 07/27/17  2:37 PM  Result Value Ref Range Status   Adenovirus NOT DETECTED NOT DETECTED Final   Coronavirus 229E NOT DETECTED NOT DETECTED Final   Coronavirus HKU1 NOT DETECTED NOT DETECTED Final   Coronavirus NL63 NOT DETECTED NOT DETECTED Final   Coronavirus OC43 NOT  DETECTED NOT DETECTED Final   Metapneumovirus NOT DETECTED NOT DETECTED Final   Rhinovirus / Enterovirus NOT DETECTED NOT DETECTED Final   Influenza A NOT DETECTED NOT DETECTED Corrected   Influenza B NOT DETECTED NOT DETECTED Final   Parainfluenza Virus 1 NOT DETECTED NOT DETECTED Final   Parainfluenza Virus 2 NOT DETECTED NOT DETECTED Final   Parainfluenza Virus 3 NOT DETECTED NOT DETECTED Final   Parainfluenza Virus 4 NOT DETECTED NOT DETECTED Final   Respiratory Syncytial Virus NOT DETECTED NOT DETECTED Final   Bordetella pertussis NOT DETECTED NOT DETECTED Final   Chlamydophila pneumoniae NOT DETECTED NOT DETECTED Final   Mycoplasma pneumoniae NOT DETECTED NOT DETECTED Final    Comment: Performed at [facility], [place]. [address], [place], [place] [address]  Culture, respiratory (NON-Expectorated)     Status: None   Collection Time: 07/28/17 11:26 AM  Result Value Ref Range Status   Specimen Description TRACHEAL ASPIRATE  Final   Special Requests NONE  Final   Gram Stain   Final    ABUNDANT WBC PRESENT,BOTH PMN AND MONONUCLEAR NO SQUAMOUS EPITHELIAL CELLS SEEN NO ORGANISMS SEEN    Culture   Final    FEW Consistent with normal respiratory flora. Performed at [facility], [name] [address] [place], [place] [address]    Report Status 07/30/2017 FINAL  Final  Culture, Urine     Status: None   Collection Time: 07/28/17  1:11 PM  Result Value Ref Range Status   Specimen Description URINE, CLEAN CATCH  Final   Special Requests NONE  Final   Culture   Final    NO GROWTH Performed at [facility], [place] [address] [place], [place] [address]    Report Status 07/29/2017 FINAL  Final  Culture, blood (Routine X 2) w Reflex to ID Panel     Status: None   Collection Time: 07/28/17  3:01 PM  Result Value Ref Range Status   Specimen Description BLOOD LEFT ARM  Final   Special Requests   Final    BOTTLES DRAWN AEROBIC AND ANAEROBIC Blood Culture adequate volume   Culture  Final    NO GROWTH 5 DAYS Performed at [facility], [place] [address] [place], [place] [address]    Report Status 08/02/2017 FINAL  Final  Culture, blood (Routine X 2) w Reflex to ID Panel     Status: None   Collection Time: 07/28/17  3:25 PM  Result Value Ref Range Status   Specimen Description BLOOD LEFT HAND  Final   Special Requests   Final    BOTTLES DRAWN AEROBIC AND ANAEROBIC Blood Culture adequate volume   Culture   Final    NO GROWTH 5 DAYS Performed at [facility], [place] [address], [place],  [address]    Report Status 08/02/2017 FINAL  Final     Liver Function Tests: Recent Labs  Lab 07/30/17 0550 07/31/17 0434  ALBUMIN 1.8* 1.7*   No results for input(s): LIPASE, AMYLASE in the last 168 hours. No results for input(s): AMMONIA in the last 168 hours.  Cardiac Enzymes: Recent Labs  Lab 07/28/17 1501  CKTOTAL 1,147*  CKMB 4.2   BNP (last 3 results) Recent Labs    10/11/16 1124 07/23/17 1214  BNP 56.5 176.4*    ProBNP (last 3 results) No results for input(s): PROBNP in the last 8760 hours.    Studies: Dg Chest 1 View  Result Date: 08/03/2017 CLINICAL DATA:  Shortness of Breath EXAM: CHEST  1 VIEW COMPARISON:  07/31/2017 FINDINGS: Diffuse left lung airspace opacity again noted, most confluent in the left upper lobe compatible with pneumonia. Right lung clear. No effusions. Heart is mildly enlarged. Left central line is unchanged. Interval removal of endotracheal tube and NG tube. IMPRESSION: Left lung airspace disease again noted most compatible with pneumonia. No significant change. Cardiomegaly. Electronically Signed   By: Rolm Baptise M.D.   On: 08/03/2017 16:06   Dg Abd 1 View  Result Date: 08/03/2017 CLINICAL DATA:  Ileus. EXAM: ABDOMEN - 1 VIEW COMPARISON:  07/31/2017 and prior exams FINDINGS: Colonic distension has decreased. A small amount of oral contrast within the colon noted. No dilated small bowel loops are present. No acute  bony abnormalities are identified. IMPRESSION: Decreased colonic distention.  No evidence of bowel obstruction. Electronically Signed   By: Margarette Canada M.D.   On: 08/03/2017 16:06    Scheduled Meds: . amLODipine  10 mg Oral Daily  . carvedilol  12.5 mg Oral BID WC  . famotidine  20 mg Oral Daily  . heparin injection (subcutaneous)  5,000 Units Subcutaneous Q8H  . hydrOXYzine  10 mg Oral QHS  . insulin aspart  0-20 Units Subcutaneous Q4H  . insulin aspart  3 Units Subcutaneous Q4H  . insulin glargine  15 Units Subcutaneous Daily  . losartan  100 mg Oral Daily  . mouth rinse  15 mL Mouth Rinse BID  . potassium chloride  40 mEq Oral Daily  . protein supplement shake  11 oz Oral BID BM  . QUEtiapine  50 mg Oral QHS      Time spent: 25 min  [facility] Pager [phone]. If 7PM-7AM, please contact night-coverage at [email or web address], Office  [phone]  password [email or web address]  08/04/2017, 1:35 PM  LOS: 11 days

## 2017-08-05 DIAGNOSIS — E1169 Type 2 diabetes mellitus with other specified complication: Secondary | ICD-10-CM

## 2017-08-05 DIAGNOSIS — E785 Hyperlipidemia, unspecified: Secondary | ICD-10-CM

## 2017-08-05 DIAGNOSIS — T383X5A Adverse effect of insulin and oral hypoglycemic [antidiabetic] drugs, initial encounter: Secondary | ICD-10-CM

## 2017-08-05 DIAGNOSIS — E160 Drug-induced hypoglycemia without coma: Secondary | ICD-10-CM

## 2017-08-05 LAB — BASIC METABOLIC PANEL
ANION GAP: 11 (ref 5–15)
BUN: 36 mg/dL — AB (ref 6–20)
CALCIUM: 9.3 mg/dL (ref 8.9–10.3)
CO2: 22 mmol/L (ref 22–32)
Chloride: 112 mmol/L — ABNORMAL HIGH (ref 101–111)
Creatinine, Ser: 1.14 mg/dL — ABNORMAL HIGH (ref 0.44–1.00)
GFR calc Af Amer: 55 mL/min — ABNORMAL LOW (ref >60)
GFR calc non Af Amer: 48 mL/min — ABNORMAL LOW (ref >60)
GLUCOSE: 70 mg/dL (ref 65–99)
POTASSIUM: 3.1 mmol/L — AB (ref 3.5–5.1)
Sodium: 145 mmol/L (ref 135–145)

## 2017-08-05 LAB — GLUCOSE, CAPILLARY
GLUCOSE-CAPILLARY: 73 mg/dL (ref 65–99)
GLUCOSE-CAPILLARY: 150 mg/dL — AB (ref 65–99)
GLUCOSE-CAPILLARY: 184 mg/dL — AB (ref 65–99)
Glucose-Capillary: 150 mg/dL — ABNORMAL HIGH (ref 65–99)
Glucose-Capillary: 252 mg/dL — ABNORMAL HIGH (ref 65–99)
Glucose-Capillary: 105 mg/dL — ABNORMAL HIGH (ref 65–99)

## 2017-08-05 MED ORDER — HYDRALAZINE HCL 20 MG/ML IJ SOLN
10.0000 mg | Freq: Four times a day (QID) | INTRAMUSCULAR | Status: DC | PRN
  Administered 2017-08-06: 10 mg via INTRAVENOUS
  Filled 2017-08-05: qty 1

## 2017-08-05 MED ORDER — INSULIN ASPART 100 UNIT/ML ~~LOC~~ SOLN
0.0000 [IU] | Freq: Three times a day (TID) | SUBCUTANEOUS | Status: DC
Start: 2017-08-05 — End: 2017-08-06
  Administered 2017-08-05: 7 [IU] via SUBCUTANEOUS
  Administered 2017-08-05 – 2017-08-06 (×2): 4 [IU] via SUBCUTANEOUS
  Administered 2017-08-06: 3 [IU] via SUBCUTANEOUS
  Administered 2017-08-06: 4 [IU] via SUBCUTANEOUS

## 2017-08-05 MED ORDER — POTASSIUM CHLORIDE CRYS ER 20 MEQ PO TBCR
40.0000 meq | EXTENDED_RELEASE_TABLET | ORAL | Status: AC
  Administered 2017-08-05 (×2): 40 meq via ORAL
  Filled 2017-08-05 (×2): qty 2

## 2017-08-05 NOTE — Progress Notes (Signed)
Occupational Therapy Treatment Patient Details Name: [name] MRN: [id number] DOB: Mar 13, 1946 Today's Date: 08/05/2017    History of present illness 72 year old female with PMH as below, which is significant for Breast Ca (on anastrozole), CAD s/p DES, diastolic CHF, who was admitted to [facility] 5/7 with complaints of orthopnea and non-productive cough that have been progressive for 3 weeks. Acutely developed pleuritic chest pain,She was treated with CAP antibiotics, gentle IVF, and supplemental O2. Chest radiograph demonstrated large L sided infiltrate. Dx Acute Hypoxic respiratory failure- Multilobar PNA L >R 5/8 intubated and placed on 3 pressors, 5/10 off pressors    OT comments  Pt progressing well. Awake and verbalizing needs. Attempting to self feed and groom in unsupported sitting at EOB. Fearful of falling, but able to transfer with one person assist from elevated bed to chair. Instructed family in elevation of L UE on 2 pillows in chair. Continue to recommend CIR.  Follow Up Recommendations  CIR;Supervision/Assistance - 24 hour    Equipment Recommendations  3 in 1 bedside commode    Recommendations for Other Services      Precautions / Restrictions Precautions Precautions: Fall Precaution Comments: fearful of falling       Mobility Bed Mobility Overal bed mobility: Needs Assistance Bed Mobility: Sidelying to Sit   Sidelying to sit: Max assist       General bed mobility comments: max assist to lift trunk  Transfers Overall transfer level: Needs assistance Equipment used: Rolling walker (2 wheeled) Transfers: Sit to/from [facility] Sit to Stand: Max assist Stand pivot transfers: Max assist       General transfer comment: assist to rise from elevated bed, attempted use of walker initially, but pt with anxiety/fear of falling, performed face to face pivot    Balance Overall balance assessment: Needs assistance   Sitting balance-Leahy  Scale: Fair Sitting balance - Comments: no support x 15 minutes while pt ate and granddaughter combed her hair     Standing balance-Leahy Scale: Poor                             ADL either performed or assessed with clinical judgement   ADL Overall ADL's : Needs assistance/impaired Eating/Feeding: Maximal assistance;Sitting Eating/Feeding Details (indicate cue type and reason): can bring cup with straw to mouth, attempted with spoon Grooming: Wash/dry face;Maximal assistance(blew her nose independently)               Lower Body Dressing: Total assistance;Sitting/lateral leans       Toileting- Clothing Manipulation and Hygiene: Total assistance;Bed level Toileting - Clothing Manipulation Details (indicate cue type and reason): pt on bed pan upon arrival       General ADL Comments: educated in elevating L UE on 2 pillows     Vision       Perception     Praxis      Cognition Arousal/Alertness: Awake/alert Behavior During Therapy: Flat affect Overall Cognitive Status: Impaired/Different from baseline Area of Impairment: Orientation;Attention;Memory;Following commands;Safety/judgement;Awareness;Problem solving                 Orientation Level: Disoriented to;Time;Situation;Place Current Attention Level: Focused Memory: Decreased recall of precautions;Decreased short-term memory Following Commands: Follows one step commands with increased time Safety/Judgement: Decreased awareness of safety;Decreased awareness of deficits Awareness: Intellectual Problem Solving: Slow processing;Decreased initiation;Difficulty sequencing;Requires verbal cues;Requires tactile cues General Comments: very slow processing speed, distractible  Exercises     Shoulder Instructions       General Comments      Pertinent Vitals/ Pain       Pain Assessment: Faces Faces Pain Scale: Hurts a little bit Pain Location: buttocks Pain Descriptors / Indicators:  Grimacing;Guarding Pain Intervention(s): Monitored during session;Repositioned  Home Living                                          Prior Functioning/Environment              Frequency  Min 2X/week        Progress Toward Goals  OT Goals(current goals can now be found in the care plan section)  Progress towards OT goals: Progressing toward goals  Acute Rehab OT Goals Patient Stated Goal: to get stronger OT Goal Formulation: With patient/family Time For Goal Achievement: 08/15/17 Potential to Achieve Goals: Good  Plan Discharge plan remains appropriate    Co-evaluation                 AM-PAC PT "6 Clicks" Daily Activity     Outcome Measure   Help from another person eating meals?: A Lot Help from another person taking care of personal grooming?: A Lot Help from another person toileting, which includes using toliet, bedpan, or urinal?: Total Help from another person bathing (including washing, rinsing, drying)?: Total Help from another person to put on and taking off regular upper body clothing?: A Lot Help from another person to put on and taking off regular lower body clothing?: Total 6 Click Score: 9    End of Session Equipment Utilized During Treatment: Gait belt  OT Visit Diagnosis: Other abnormalities of gait and mobility (R26.89);Muscle weakness (generalized) (M62.81);Other symptoms and signs involving cognitive function   Activity Tolerance Patient tolerated treatment well   Patient Left in chair;with call bell/phone within reach;with family/visitor present   Nurse Communication          Time: 6378-5885 OT Time Calculation (min): 43 min  Charges: OT General Charges $OT Visit: 1 Visit OT Treatments $Self Care/Home Management : 38-52 mins  08/05/2017 Nestor Lewandowsky, OTR/L Pager: [phone]   Malka So 08/05/2017, 10:08 AM

## 2017-08-05 NOTE — Progress Notes (Signed)
Inpatient Rehabilitation Admissions Coordinator  I met with patient and son in law at bedside. I then contacted her daughter, [name]. We discussed goals and expectation off an inpt rehab admit. They prefer CIR rather than SNF. Family can arrange 24/7 assist at d/c. I will begin insurance authorization and follow up pending their approval. RN CM and SW made aware.   Danne Baxter, RN, MSN Rehab Admissions Coordinator [phone] 08/05/2017 1:25 PM

## 2017-08-05 NOTE — Progress Notes (Signed)
Received call from Dorian Pod with [facility]; patient's daughter is requesting that [facility] be the preferred provider for [facility] services after rehab; Aneta Mins [phone]

## 2017-08-05 NOTE — Progress Notes (Signed)
Physical Therapy Treatment Patient Details Name: [name] MRN: [id number] DOB: [birth year] Today's Date: 08/05/2017    History of Present Illness 72 year old female with PMH as below, which is significant for Breast Ca (on anastrozole), CAD s/p DES, diastolic CHF, who was admitted to [facility] 5/7 with complaints of orthopnea and non-productive cough that have been progressive for 3 weeks. Acutely developed pleuritic chest pain,She was treated with CAP antibiotics, gentle IVF, and supplemental O2. Chest radiograph demonstrated large L sided infiltrate. Dx Acute Hypoxic respiratory failure- Multilobar PNA L >R 5/8 intubated and placed on 3 pressors, 5/10 off pressors     PT Comments    Pt admitted with above diagnosis. Pt currently with functional limitations due to the deficits listed below (see PT Problem List). Patient able to complete 5 sit-to-stand transfers with min to mod assist; side-stepping ambulation with RW 5 feet' attempted ambulation with RW stepping back to bed with mod to max assist; sit to supine transfer with max assist. Pt will benefit from skilled PT to increase their independence and safety with mobility to allow discharge to the venue listed below.      Follow Up Recommendations  CIR     Equipment Recommendations  Other (comment)(TBD at next venue)    Recommendations for Other Services Rehab consult     Precautions / Restrictions Precautions Precautions: Fall Precaution Comments: fearful of falling Restrictions Weight Bearing Restrictions: No    Mobility  Bed Mobility Overal bed mobility: Needs Assistance Bed Mobility: Sit to Supine Rolling: Max assist Sidelying to sit: Max assist   Sit to supine: Max assist   General bed mobility comments: max assist to lift trunk  Transfers Overall transfer level: Needs assistance Equipment used: Rolling walker (2 wheeled) Transfers: Sit to/from [facility] Sit to Stand: Mod assist;Min  assist Stand pivot transfers: Mod assist       General transfer comment: sit-to-stand transfers x5 with progressively more assistance as fatigue from min to mod A; verbal cues for placement of hands and sequencing of steps.  Ambulation/Gait Ambulation/Gait assistance: Mod assist Ambulation Distance (Feet): 5 Feet Assistive device: Rolling walker (2 wheeled) Gait Pattern/deviations: Step-to pattern;Decreased stride length;Decreased step length - right;Decreased step length - left;Decreased stance time - right;Decreased stance time - left;Decreased weight shift to right;Decreased weight shift to left;Shuffle;Wide base of support;Trunk flexed     General Gait Details: markedly decreased; fatigues quickly   Stairs             Wheelchair Mobility    Modified Rankin (Stroke Patients Only)       Balance Overall balance assessment: Needs assistance Sitting-balance support: Bilateral upper extremity supported;Feet supported Sitting balance-Leahy Scale: Fair Sitting balance - Comments: no support x 15 minutes while pt ate and granddaughter combed her hair   Standing balance support: Bilateral upper extremity supported;During functional activity Standing balance-Leahy Scale: Poor                              Cognition Arousal/Alertness: Awake/alert Behavior During Therapy: Flat affect Overall Cognitive Status: Impaired/Different from baseline Area of Impairment: Orientation;Attention;Memory;Following commands;Safety/judgement;Awareness;Problem solving                 Orientation Level: Disoriented to;Time;Situation;Place Current Attention Level: Focused Memory: Decreased recall of precautions;Decreased short-term memory Following Commands: Follows one step commands with increased time Safety/Judgement: Decreased awareness of safety;Decreased awareness of deficits Awareness: Intellectual Problem Solving: Slow processing;Decreased initiation;Difficulty  sequencing;Requires verbal cues;Requires tactile cues General Comments: she did crack a couple of jokes today with a belly laugh.      Exercises      General Comments        Pertinent Vitals/Pain Pain Assessment: Faces Faces Pain Scale: Hurts a little bit Pain Location: buttocks Pain Descriptors / Indicators: Grimacing;Guarding Pain Intervention(s): Monitored during session;Repositioned    Home Living                      Prior Function            PT Goals (current goals can now be found in the care plan section) Acute Rehab PT Goals Patient Stated Goal: to get stronger Progress towards PT goals: Progressing toward goals    Frequency    Min 3X/week      PT Plan Current plan remains appropriate    Co-evaluation              AM-PAC PT "6 Clicks" Daily Activity  Outcome Measure  Difficulty turning over in bed (including adjusting bedclothes, sheets and blankets)?: Unable Difficulty moving from lying on back to sitting on the side of the bed? : A Lot Difficulty sitting down on and standing up from a chair with arms (e.g., wheelchair, bedside commode, etc,.)?: A Lot Help needed moving to and from a bed to chair (including a wheelchair)?: A Lot Help needed walking in hospital room?: Total Help needed climbing 3-5 steps with a railing? : Total 6 Click Score: 9    End of Session Equipment Utilized During Treatment: Gait belt Activity Tolerance: Patient limited by fatigue Patient left: in bed;with call bell/phone within reach;with family/visitor present Nurse Communication: Mobility status PT Visit Diagnosis: Muscle weakness (generalized) (M62.81);Difficulty in walking, not elsewhere classified (R26.2);Other abnormalities of gait and mobility (R26.89)     Time: 8101-7510 PT Time Calculation (min) (ACUTE ONLY): 46 min  Charges:  $Gait Training: 8-22 mins $Therapeutic Activity: 23-37 mins                    G Codes:       Peni Rupard D.  Hartnett-Rands, MS, PT Per [place] [phone] 08/05/2017, 11:46 AM

## 2017-08-05 NOTE — Progress Notes (Signed)
  Speech Language Pathology Treatment: Dysphagia  Patient Details Name: [name] MRN: [id number] DOB: [birth year] Today's Date: 08/05/2017 Time: 0923-3007 SLP Time Calculation (min) (ACUTE ONLY): 20 min  Assessment / Plan / Recommendation Clinical Impression  Pt had lunch present during tx session. Pt showed very little interest in food on tray. She tolerated the pureed, thin liquids, but nurse and family reported she doesn't like it. Pt exhibited prolonged mastication with a graham cracker, however she did clear and swallow effectively. She demonstrated she is ready for an upgrade to Dys 2, thin liquid diet. Nurse and family present (son and daughter) notified of diet change and plan of care.    HPI HPI: Pt is a 72 year old female who was admitted to [facility] 5/7 with hypoxia and pleuritic chest pain. She had a decline in respiratory status 5/8 with brief PEA arrest f/b cardiogenic and septic shock requiring pressors. ETT 5/8-5/15. PMH includes CAD, CHF, CKD, DM, HTN, Stage IA right invasive ductal breast carcinoma, lupus. No prior swallow evaluations documented, although review of prior documentation suggests chronic difficulty swallowing pills.      SLP Plan  Continue with current plan of care       Recommendations  Diet recommendations: Dysphagia 2 (fine chop);Thin liquid Liquids provided via: Cup;Straw Medication Administration: Crushed with puree Supervision: Staff to assist with self feeding;Full supervision/cueing for compensatory strategies Compensations: Minimize environmental distractions;Slow rate;Small sips/bites Postural Changes and/or Swallow Maneuvers: Seated upright 90 degrees                Plan: Continue with current plan of care       [place], MA, CCC-SLP 08/05/2017 2:37 PM

## 2017-08-05 NOTE — Consult Note (Signed)
   [facility] Inpatient Consult   08/05/2017  [name] 1945/09/21 [id number]   Patient assessed for high risk for unplanned readmission and long [facility]. Patient in the [employer]. Chart reveals patient admitted with HF exacerbation with complaints of chest pain and shortness of breath with CAP. Patient had a brief PEA arrest with cardiogenic and septic shock per MD notes.    Patient is currently being evaluated for inpatient rehabilitation.  Will follow for disposition. For questions contact:   Natividad Brood, RN BSN [facility] Liaison  [phone] business mobile phone Toll free office [phone]

## 2017-08-05 NOTE — Progress Notes (Signed)
[facility]  PROGRESS NOTE  [name]:[id number] DOB: [birth year] DOA: 07/23/2017 PCP: Bartholomew Crews, MD   Brief HPI:    72 year old woman with PMHx significant for breast cancer (on anastrozole), CAD s/p DES, HFpEF, CKD, DM, HTN prior CVA per family report who was initially admitted to [facility] directly from clinic due to hypoxic respiratory failure 2/2 multilobar community acquired pneumonia. She deteriorated overnight the evening of her admission and the following morning was subsequently intubated and transferred to the MICU. She had bronchoscopy by Dr Nelda Marseille on 5/8 and there was concern for alveolar hemmorhage based on the findings. Following intubation, she had junctional bradycardia with brief PEA arrest. This further required 3 pressors for her septic shock. She was able to come off pressers about 2 days later on 5/10. On 5/13 she developed an SBO which has since improved and nursing reported large bowel movements since. Her culture data has returned negative. She also had an autoimmune work up that was negative. Ultimately, she was extubated to nasal cannula on 5/15. Since extubation, she has continued to have delirium here in the ICU and is globally weak and deconditioned. PT/OT evaluations are recommending CIR. There was concern regarding right sided weakness however, nursing reports this has improved     Subjective   Patient seen and examined, became hypoglycemic this morning.   Assessment/Plan:     1. Acute hypoxic respiratory failure-secondary to multilobar pneumonia, mucous plugging.  Resolved.  Patient is on bronchodilators, antibiotics have been discontinued.  Completed 8 days of meropenem. 2. Brief PEA arrest following intubation-patient had bradycardic rhythm for less than 3 minutes.  Mild troponin leak on 07/27/2017.  Felt to be demand ischemia per cardiology. 3. History of CAD and diastolic heart failure-status post drug-eluting stent, she  has preserved ejection fraction from echo on 07/27/2017.  Aspirin and Plavix is currently on hold.  Note from cardiology from 07/28/2017, recommended to hold aspirin and Plavix until ARDS resolved.  Review of PCCM note from 08/01/2017 recommended to start Plavix and aspirin in the next 24 to 48 hours.  I will restart aspirin and Plavix at this time. 4. Acute encephalopathy-likely metabolic but patient may have had anoxic injury from brief less than 3 minutes cardiac arrest.  Patient will need rehab.  Failure has been consulted.  Will start Seroquel 50 mg p.o. nightly for hallucinations/agitation 5. Diabetes mellitus, type II, no acute complications-became hypoglycemic this morning,  continue sliding scale insulin with NovoLog.  Continue Lantus.  Will  discontinue NovoLog 4 units every 4 hours. 6. Stage IV right invasive ductal breast carcinoma-status post right breast lumpectomy and XRT in 2014.  Anastrozole was held in the hospital.  Will restart anastrozole at this time. 7. Hypernatremia-sodium slowly improving, today 145.hypokalemia discontinue D5W.  8. Chronic kidney disease stage III-creatinine has improved.  Today creatinine is 1.21.  Follow BMP in am. 9. Hypokalemia-potassium is 3.1, will replace potassium and check BMP in a.m.      DVT prophylaxis: SCDs, not on anticoagulation due to concern for pulmonary hemorrhage.  Code Status: Full code  Family Communication: No family at bedside  Disposition Plan: likely home when medically ready for discharge   Consultants:  PCCM  Procedures: 5/7 admit 5/8 started on BiPAP and transferred to SDU. Brief junctional bradycardia w/ brief PEA arrest f/b cardiogenic & septic shock 3 pressor dependent >Intubated 5/10 Off pressors      Antibiotics:   Anti-infectives (From admission, onward)   Start  Dose/Rate Route Frequency Ordered Stop   07/31/17 0100  meropenem (MERREM) 1 g in sodium chloride 0.9 % 100 mL IVPB     1 g 200 mL/hr over 30  Minutes Intravenous Every 12 hours 07/30/17 1426 08/04/17 1421   07/28/17 1300  vancomycin (VANCOCIN) IVPB 1000 mg/200 mL premix  Status:  Discontinued     1,000 mg 200 mL/hr over 60 Minutes Intravenous Every 24 hours 07/28/17 1147 07/28/17 1218   07/28/17 1300  meropenem (MERREM) 1 g in sodium chloride 0.9 % 100 mL IVPB  Status:  Discontinued     1 g 200 mL/hr over 30 Minutes Intravenous Every 12 hours 07/28/17 1222 07/30/17 1426   07/27/17 2200  levofloxacin (LEVAQUIN) IVPB 500 mg  Status:  Discontinued     500 mg 100 mL/hr over 60 Minutes Intravenous Every 48 hours 07/27/17 1342 07/28/17 1218   07/27/17 1800  vancomycin (VANCOCIN) IVPB 1000 mg/200 mL premix  Status:  Discontinued     1,000 mg 200 mL/hr over 60 Minutes Intravenous Every 24 hours 07/27/17 1232 07/27/17 1341   07/25/17 1700  vancomycin (VANCOCIN) IVPB 1000 mg/200 mL premix  Status:  Discontinued     1,000 mg 200 mL/hr over 60 Minutes Intravenous Every 24 hours 07/24/17 1425 07/27/17 1224   07/24/17 1500  vancomycin (VANCOCIN) 1,750 mg in sodium chloride 0.9 % 500 mL IVPB     1,750 mg 250 mL/hr over 120 Minutes Intravenous  Once 07/24/17 1351 07/24/17 1707   07/23/17 2200  azithromycin (ZITHROMAX) 500 mg in sodium chloride 0.9 % 250 mL IVPB  Status:  Discontinued     500 mg 250 mL/hr over 60 Minutes Intravenous Every 24 hours 07/23/17 1856 07/27/17 1341   07/23/17 1915  cefTRIAXone (ROCEPHIN) 2 g in sodium chloride 0.9 % 100 mL IVPB  Status:  Discontinued     2 g 200 mL/hr over 30 Minutes Intravenous Every 24 hours 07/23/17 1914 07/27/17 1341       Objective   Vitals:   08/05/17 0600 08/05/17 1000 08/05/17 1152 08/05/17 1152  BP: (!) 181/65 (!) 149/59 (!) 170/70 (!) 170/70  Pulse: 86  89 89  Resp: 20  16 16   Temp: 98.8 F (37.1 C)  98.1 F (36.7 C) 98.1 F (36.7 C)  TempSrc: Oral  Oral Oral  SpO2: 97% 97% 98% 98%  Weight:      Height:        Intake/Output Summary (Last 24 hours) at 08/05/2017 1443 Last  data filed at 08/05/2017 1300 Gross per 24 hour  Intake 2355 ml  Output 300 ml  Net 2055 ml   Filed Weights   08/03/17 0408 08/04/17 0400 08/05/17 0500  Weight: 81.2 kg (179 lb) 81.6 kg (180 lb) 83.3 kg (183 lb 10.3 oz)     Physical Examination:    General: Appears in no acute distress  Cardiovascular: S1-S2 regular  Respiratory: Normal respiratory effort, clear to auscultation bilaterally  Abdomen: Soft, nontender, no organomegaly  Extremities: No edema of the lower extremities  Neurologic: Alert oriented x2     Data Reviewed: I have personally reviewed following labs and imaging studies  CBG: Recent Labs  Lab 08/04/17 2155 08/05/17 0106 08/05/17 0409 08/05/17 0801 08/05/17 1156  GLUCAP 193* 150* 73 150* 252*    CBC: Recent Labs  Lab 07/30/17 0550 07/31/17 0434 08/01/17 0500 08/03/17 0547 08/04/17 0445  WBC 33.3* 29.4* 31.9* 19.6* 18.9*  NEUTROABS  --   --   --   --  16.0*  HGB 8.7* 8.9* 9.1* 9.3* 9.6*  HCT 27.5* 28.9* 29.5* 29.6* 31.3*  MCV 79.7 82.1 81.0 81.8 80.7  PLT 189 187 209 213 678    Basic Metabolic Panel: Recent Labs  Lab 07/30/17 0550 07/31/17 0434 08/01/17 0500 08/02/17 0405 08/03/17 0547 08/04/17 0445 08/05/17 0430  NA 150* 149* 150* 148* 149* 146* 145  K 4.0 3.6 3.1* 3.1* 3.2* 3.4* 3.1*  CL 103 105 110 113* 114* 113* 112*  CO2 33* 31 29 25 25 23 22   GLUCOSE 179* 297* 216* 172* 166* 159* 70  BUN 98* 101* 81* 63* 45* 41* 36*  CREATININE 1.94* 1.91* 1.67* 1.38* 1.20* 1.21* 1.14*  CALCIUM 9.4 9.4 9.5 9.4 9.2 9.3 9.3  MG 2.8* 2.5* 2.2 2.2  --  2.0  --   PHOS 5.0*  5.0* 4.1 2.8  --   --   --   --     Recent Results (from the past 240 hour(s))  Respiratory Panel by PCR     Status: None   Collection Time: 07/27/17  2:37 PM  Result Value Ref Range Status   Adenovirus NOT DETECTED NOT DETECTED Final   Coronavirus 229E NOT DETECTED NOT DETECTED Final   Coronavirus HKU1 NOT DETECTED NOT DETECTED Final   Coronavirus NL63 NOT  DETECTED NOT DETECTED Final   Coronavirus OC43 NOT DETECTED NOT DETECTED Final   Metapneumovirus NOT DETECTED NOT DETECTED Final   Rhinovirus / Enterovirus NOT DETECTED NOT DETECTED Final   Influenza A NOT DETECTED NOT DETECTED Corrected   Influenza B NOT DETECTED NOT DETECTED Final   Parainfluenza Virus 1 NOT DETECTED NOT DETECTED Final   Parainfluenza Virus 2 NOT DETECTED NOT DETECTED Final   Parainfluenza Virus 3 NOT DETECTED NOT DETECTED Final   Parainfluenza Virus 4 NOT DETECTED NOT DETECTED Final   Respiratory Syncytial Virus NOT DETECTED NOT DETECTED Final   Bordetella pertussis NOT DETECTED NOT DETECTED Final   Chlamydophila pneumoniae NOT DETECTED NOT DETECTED Final   Mycoplasma pneumoniae NOT DETECTED NOT DETECTED Final    Comment: Performed at [facility], [place]. [address], [place], [place] [address]  Culture, respiratory (NON-Expectorated)     Status: None   Collection Time: 07/28/17 11:26 AM  Result Value Ref Range Status   Specimen Description TRACHEAL ASPIRATE  Final   Special Requests NONE  Final   Gram Stain   Final    ABUNDANT WBC PRESENT,BOTH PMN AND MONONUCLEAR NO SQUAMOUS EPITHELIAL CELLS SEEN NO ORGANISMS SEEN    Culture   Final    FEW Consistent with normal respiratory flora. Performed at [facility], [place] [address] [place], [place] [address]    Report Status 07/30/2017 FINAL  Final  Culture, Urine     Status: None   Collection Time: 07/28/17  1:11 PM  Result Value Ref Range Status   Specimen Description URINE, CLEAN CATCH  Final   Special Requests NONE  Final   Culture   Final    NO GROWTH Performed at [facility], [place] [address] [place], [place] [address]    Report Status 07/29/2017 FINAL  Final  Culture, blood (Routine X 2) w Reflex to ID Panel     Status: None   Collection Time: 07/28/17  3:01 PM  Result Value Ref Range Status   Specimen Description BLOOD LEFT ARM  Final   Special Requests   Final    BOTTLES DRAWN AEROBIC  AND ANAEROBIC Blood Culture adequate volume  Culture   Final    NO GROWTH 5 DAYS Performed at [facility], [place] [address] [place], [place] [address]    Report Status 08/02/2017 FINAL  Final  Culture, blood (Routine X 2) w Reflex to ID Panel     Status: None   Collection Time: 07/28/17  3:25 PM  Result Value Ref Range Status   Specimen Description BLOOD LEFT HAND  Final   Special Requests   Final    BOTTLES DRAWN AEROBIC AND ANAEROBIC Blood Culture adequate volume   Culture   Final    NO GROWTH 5 DAYS Performed at [facility], [place] [address] [place], [place] [address]    Report Status 08/02/2017 FINAL  Final  C difficile quick scan w PCR reflex     Status: None   Collection Time: 08/04/17 11:44 AM  Result Value Ref Range Status   C Diff antigen NEGATIVE NEGATIVE Final   C Diff toxin NEGATIVE NEGATIVE Final   C Diff interpretation No C. difficile detected.  Final    Comment: Performed at [facility], [place] [address], [place], [place] [address]     Liver Function Tests: Recent Labs  Lab 07/30/17 0550 07/31/17 0434  ALBUMIN 1.8* 1.7*   No results for input(s): LIPASE, AMYLASE in the last 168 hours. No results for input(s): AMMONIA in the last 168 hours.  Cardiac Enzymes: No results for input(s): CKTOTAL, CKMB, CKMBINDEX, TROPONINI in the last 168 hours. BNP (last 3 results) Recent Labs    10/11/16 1124 07/23/17 1214  BNP 56.5 176.4*    ProBNP (last 3 results) No results for input(s): PROBNP in the last 8760 hours.    Studies: No results found.  Scheduled Meds: . amLODipine  10 mg Oral Daily  . anastrozole  1 mg Oral Daily  . aspirin EC  81 mg Oral Daily  . carvedilol  12.5 mg Oral BID WC  . clopidogrel  75 mg Oral Daily  . famotidine  20 mg Oral Daily  . heparin injection (subcutaneous)  5,000 Units Subcutaneous Q8H  . hydrOXYzine  10 mg Oral QHS  . insulin aspart  0-20 Units Subcutaneous TID WC  . insulin glargine  15 Units  Subcutaneous Daily  . losartan  100 mg Oral Daily  . mouth rinse  15 mL Mouth Rinse BID  . potassium chloride  40 mEq Oral Q4H  . protein supplement shake  11 oz Oral BID BM  . QUEtiapine  50 mg Oral QHS      Time spent: 25 min  [facility] Pager [phone]. If 7PM-7AM, please contact night-coverage at [email or web address], Office  [phone]  password [email or web address]  08/05/2017, 2:43 PM  LOS: 12 days

## 2017-08-05 NOTE — Clinical Social Work Note (Signed)
CSW called and spoke with patient's daughter, Hattie Perch. Provided bed offers with 3 stars or above as requested. Also provided pending bed offers with same ratings. CIR is starting insurance authorization. If she cannot go to CIR, [facility] is first preference SNF. CSW just sent referral to them.   Dayton Scrape, [place]

## 2017-08-06 ENCOUNTER — Encounter (HOSPITAL_COMMUNITY): Payer: Self-pay

## 2017-08-06 ENCOUNTER — Other Ambulatory Visit: Payer: Self-pay

## 2017-08-06 ENCOUNTER — Inpatient Hospital Stay (HOSPITAL_COMMUNITY)
Admission: RE | Admit: 2017-08-06 | Discharge: 2017-08-22 | DRG: 945 | Disposition: A | Discharge status: Home Health | Payer: Medicare Other | Source: Intra-hospital | Attending: Physical Medicine & Rehabilitation | Admitting: Physical Medicine & Rehabilitation

## 2017-08-06 DIAGNOSIS — L03114 Cellulitis of left upper limb: Secondary | ICD-10-CM | POA: Diagnosis not present

## 2017-08-06 DIAGNOSIS — R410 Disorientation, unspecified: Secondary | ICD-10-CM | POA: Diagnosis not present

## 2017-08-06 DIAGNOSIS — K5090 Crohn's disease, unspecified, without complications: Secondary | ICD-10-CM | POA: Diagnosis not present

## 2017-08-06 DIAGNOSIS — Z800 Family history of malignant neoplasm of digestive organs: Secondary | ICD-10-CM | POA: Diagnosis not present

## 2017-08-06 DIAGNOSIS — I130 Hypertensive heart and chronic kidney disease with heart failure and stage 1 through stage 4 chronic kidney disease, or unspecified chronic kidney disease: Secondary | ICD-10-CM | POA: Diagnosis present

## 2017-08-06 DIAGNOSIS — D62 Acute posthemorrhagic anemia: Secondary | ICD-10-CM | POA: Diagnosis not present

## 2017-08-06 DIAGNOSIS — E1169 Type 2 diabetes mellitus with other specified complication: Secondary | ICD-10-CM | POA: Diagnosis not present

## 2017-08-06 DIAGNOSIS — R7309 Other abnormal glucose: Secondary | ICD-10-CM

## 2017-08-06 DIAGNOSIS — D72829 Elevated white blood cell count, unspecified: Secondary | ICD-10-CM

## 2017-08-06 DIAGNOSIS — G9340 Encephalopathy, unspecified: Secondary | ICD-10-CM

## 2017-08-06 DIAGNOSIS — I2510 Atherosclerotic heart disease of native coronary artery without angina pectoris: Secondary | ICD-10-CM | POA: Diagnosis not present

## 2017-08-06 DIAGNOSIS — E876 Hypokalemia: Secondary | ICD-10-CM | POA: Diagnosis not present

## 2017-08-06 DIAGNOSIS — N184 Chronic kidney disease, stage 4 (severe): Secondary | ICD-10-CM

## 2017-08-06 DIAGNOSIS — Z7902 Long term (current) use of antithrombotics/antiplatelets: Secondary | ICD-10-CM | POA: Diagnosis not present

## 2017-08-06 DIAGNOSIS — I169 Hypertensive crisis, unspecified: Secondary | ICD-10-CM | POA: Diagnosis not present

## 2017-08-06 DIAGNOSIS — I10 Essential (primary) hypertension: Secondary | ICD-10-CM | POA: Diagnosis not present

## 2017-08-06 DIAGNOSIS — G4700 Insomnia, unspecified: Secondary | ICD-10-CM | POA: Diagnosis present

## 2017-08-06 DIAGNOSIS — Z90710 Acquired absence of both cervix and uterus: Secondary | ICD-10-CM

## 2017-08-06 DIAGNOSIS — E1122 Type 2 diabetes mellitus with diabetic chronic kidney disease: Secondary | ICD-10-CM | POA: Diagnosis not present

## 2017-08-06 DIAGNOSIS — Z8249 Family history of ischemic heart disease and other diseases of the circulatory system: Secondary | ICD-10-CM

## 2017-08-06 DIAGNOSIS — Z955 Presence of coronary angioplasty implant and graft: Secondary | ICD-10-CM

## 2017-08-06 DIAGNOSIS — R350 Frequency of micturition: Secondary | ICD-10-CM | POA: Diagnosis not present

## 2017-08-06 DIAGNOSIS — Z853 Personal history of malignant neoplasm of breast: Secondary | ICD-10-CM | POA: Diagnosis not present

## 2017-08-06 DIAGNOSIS — Z885 Allergy status to narcotic agent status: Secondary | ICD-10-CM

## 2017-08-06 DIAGNOSIS — Z7982 Long term (current) use of aspirin: Secondary | ICD-10-CM

## 2017-08-06 DIAGNOSIS — Z923 Personal history of irradiation: Secondary | ICD-10-CM | POA: Diagnosis not present

## 2017-08-06 DIAGNOSIS — I5022 Chronic systolic (congestive) heart failure: Secondary | ICD-10-CM | POA: Diagnosis not present

## 2017-08-06 DIAGNOSIS — G479 Sleep disorder, unspecified: Secondary | ICD-10-CM | POA: Diagnosis not present

## 2017-08-06 DIAGNOSIS — E1142 Type 2 diabetes mellitus with diabetic polyneuropathy: Secondary | ICD-10-CM | POA: Diagnosis not present

## 2017-08-06 DIAGNOSIS — N182 Chronic kidney disease, stage 2 (mild): Secondary | ICD-10-CM

## 2017-08-06 DIAGNOSIS — Z803 Family history of malignant neoplasm of breast: Secondary | ICD-10-CM | POA: Diagnosis not present

## 2017-08-06 DIAGNOSIS — R0989 Other specified symptoms and signs involving the circulatory and respiratory systems: Secondary | ICD-10-CM | POA: Diagnosis not present

## 2017-08-06 DIAGNOSIS — Z888 Allergy status to other drugs, medicaments and biological substances status: Secondary | ICD-10-CM

## 2017-08-06 DIAGNOSIS — Z9049 Acquired absence of other specified parts of digestive tract: Secondary | ICD-10-CM | POA: Diagnosis not present

## 2017-08-06 DIAGNOSIS — N390 Urinary tract infection, site not specified: Secondary | ICD-10-CM | POA: Diagnosis not present

## 2017-08-06 DIAGNOSIS — I152 Hypertension secondary to endocrine disorders: Secondary | ICD-10-CM

## 2017-08-06 DIAGNOSIS — E669 Obesity, unspecified: Secondary | ICD-10-CM

## 2017-08-06 DIAGNOSIS — R1310 Dysphagia, unspecified: Secondary | ICD-10-CM | POA: Diagnosis present

## 2017-08-06 DIAGNOSIS — I4510 Unspecified right bundle-branch block: Secondary | ICD-10-CM | POA: Diagnosis present

## 2017-08-06 DIAGNOSIS — Z8674 Personal history of sudden cardiac arrest: Secondary | ICD-10-CM

## 2017-08-06 DIAGNOSIS — D649 Anemia, unspecified: Secondary | ICD-10-CM

## 2017-08-06 DIAGNOSIS — Z801 Family history of malignant neoplasm of trachea, bronchus and lung: Secondary | ICD-10-CM | POA: Diagnosis not present

## 2017-08-06 DIAGNOSIS — D72823 Leukemoid reaction: Secondary | ICD-10-CM | POA: Diagnosis not present

## 2017-08-06 DIAGNOSIS — R5381 Other malaise: Secondary | ICD-10-CM | POA: Diagnosis not present

## 2017-08-06 DIAGNOSIS — J80 Acute respiratory distress syndrome: Secondary | ICD-10-CM

## 2017-08-06 DIAGNOSIS — Z79811 Long term (current) use of aromatase inhibitors: Secondary | ICD-10-CM

## 2017-08-06 DIAGNOSIS — R1312 Dysphagia, oropharyngeal phase: Secondary | ICD-10-CM | POA: Diagnosis not present

## 2017-08-06 DIAGNOSIS — E119 Type 2 diabetes mellitus without complications: Secondary | ICD-10-CM

## 2017-08-06 DIAGNOSIS — Z794 Long term (current) use of insulin: Secondary | ICD-10-CM

## 2017-08-06 LAB — CBC
HCT: 30.1 % — ABNORMAL LOW (ref 36.0–46.0)
Hemoglobin: 9.4 g/dL — ABNORMAL LOW (ref 12.0–15.0)
MCH: 25.1 pg — ABNORMAL LOW (ref 26.0–34.0)
MCHC: 31.2 g/dL (ref 30.0–36.0)
MCV: 80.3 fL (ref 78.0–100.0)
PLATELETS: 286 10*3/uL (ref 150–400)
RBC: 3.75 MIL/uL — ABNORMAL LOW (ref 3.87–5.11)
RDW: 17.3 % — ABNORMAL HIGH (ref 11.5–15.5)
WBC: 14.4 10*3/uL — AB (ref 4.0–10.5)

## 2017-08-06 LAB — BASIC METABOLIC PANEL
Anion gap: 8 (ref 5–15)
BUN: 27 mg/dL — ABNORMAL HIGH (ref 6–20)
CHLORIDE: 115 mmol/L — AB (ref 101–111)
CO2: 19 mmol/L — AB (ref 22–32)
CREATININE: 1.03 mg/dL — AB (ref 0.44–1.00)
Calcium: 9 mg/dL (ref 8.9–10.3)
GFR calc non Af Amer: 54 mL/min — ABNORMAL LOW (ref >60)
GLUCOSE: 118 mg/dL — AB (ref 65–99)
Potassium: 3.9 mmol/L (ref 3.5–5.1)
Sodium: 142 mmol/L (ref 135–145)

## 2017-08-06 LAB — CREATININE, SERUM
CREATININE: 1.07 mg/dL — AB (ref 0.44–1.00)
GFR calc Af Amer: 60 mL/min — ABNORMAL LOW (ref >60)
GFR, EST NON AFRICAN AMERICAN: 51 mL/min — AB (ref >60)

## 2017-08-06 LAB — GLUCOSE, CAPILLARY
GLUCOSE-CAPILLARY: 141 mg/dL — AB (ref 65–99)
GLUCOSE-CAPILLARY: 175 mg/dL — AB (ref 65–99)
GLUCOSE-CAPILLARY: 119 mg/dL — AB (ref 65–99)
Glucose-Capillary: 189 mg/dL — ABNORMAL HIGH (ref 65–99)

## 2017-08-06 MED ORDER — HEPARIN SODIUM (PORCINE) 5000 UNIT/ML IJ SOLN: 5000.0000 [IU] | Freq: Three times a day (TID) | INTRAMUSCULAR | Status: DC

## 2017-08-06 MED ORDER — FUROSEMIDE 40 MG PO TABS
40.0000 mg | ORAL_TABLET | Freq: Every day | ORAL | Status: DC
  Administered 2017-08-06: 40 mg via ORAL
  Filled 2017-08-06: qty 1

## 2017-08-06 MED ORDER — QUETIAPINE FUMARATE 50 MG PO TABS
75.0000 mg | ORAL_TABLET | Freq: Every day | ORAL | Status: DC
  Administered 2017-08-06 – 2017-08-21 (×16): 75 mg via ORAL
  Filled 2017-08-06 (×17): qty 1

## 2017-08-06 MED ORDER — ALBUTEROL SULFATE (2.5 MG/3ML) 0.083% IN NEBU
2.5000 mg | INHALATION_SOLUTION | RESPIRATORY_TRACT | Status: DC | PRN
Start: 2017-08-06 — End: 2017-08-22
  Filled 2017-08-06: qty 3

## 2017-08-06 MED ORDER — LOSARTAN POTASSIUM 50 MG PO TABS
100.0000 mg | ORAL_TABLET | Freq: Every day | ORAL | Status: DC
  Administered 2017-08-07 – 2017-08-22 (×16): 100 mg via ORAL
  Filled 2017-08-06 (×17): qty 2

## 2017-08-06 MED ORDER — FAMOTIDINE 40 MG/5ML PO SUSR
20.0000 mg | Freq: Every day | ORAL | Status: DC
  Administered 2017-08-07 – 2017-08-20 (×14): 20 mg via ORAL
  Filled 2017-08-06 (×15): qty 2.5

## 2017-08-06 MED ORDER — HYDROXYZINE HCL 10 MG PO TABS
10.0000 mg | ORAL_TABLET | Freq: Every day | ORAL | Status: DC
  Administered 2017-08-06 – 2017-08-11 (×6): 10 mg via ORAL
  Filled 2017-08-06 (×6): qty 1

## 2017-08-06 MED ORDER — ACETAMINOPHEN 325 MG PO TABS
650.0000 mg | ORAL_TABLET | Freq: Four times a day (QID) | ORAL | Status: DC | PRN
  Administered 2017-08-07 – 2017-08-19 (×12): 650 mg via ORAL
  Filled 2017-08-06 (×12): qty 2

## 2017-08-06 MED ORDER — HEPARIN SODIUM (PORCINE) 5000 UNIT/ML IJ SOLN
5000.0000 [IU] | Freq: Three times a day (TID) | INTRAMUSCULAR | Status: DC
  Administered 2017-08-06 – 2017-08-13 (×20): 5000 [IU] via SUBCUTANEOUS
  Filled 2017-08-06 (×19): qty 1

## 2017-08-06 MED ORDER — PREMIER PROTEIN SHAKE
11.0000 [oz_av] | Freq: Two times a day (BID) | ORAL | Status: DC
  Administered 2017-08-07 – 2017-08-20 (×15): 11 [oz_av] via ORAL
  Filled 2017-08-06 (×41): qty 325.31

## 2017-08-06 MED ORDER — INSULIN ASPART 100 UNIT/ML ~~LOC~~ SOLN
0.0000 [IU] | Freq: Three times a day (TID) | SUBCUTANEOUS | Status: DC
  Administered 2017-08-07: 4 [IU] via SUBCUTANEOUS
  Administered 2017-08-07: 3 [IU] via SUBCUTANEOUS
  Administered 2017-08-08: 4 [IU] via SUBCUTANEOUS
  Administered 2017-08-08: 7 [IU] via SUBCUTANEOUS
  Administered 2017-08-09: 3 [IU] via SUBCUTANEOUS
  Administered 2017-08-09 – 2017-08-12 (×4): 4 [IU] via SUBCUTANEOUS
  Administered 2017-08-13 – 2017-08-15 (×5): 3 [IU] via SUBCUTANEOUS
  Administered 2017-08-16: 4 [IU] via SUBCUTANEOUS
  Administered 2017-08-16 – 2017-08-18 (×3): 3 [IU] via SUBCUTANEOUS
  Administered 2017-08-18: 4 [IU] via SUBCUTANEOUS
  Administered 2017-08-19 – 2017-08-20 (×3): 3 [IU] via SUBCUTANEOUS

## 2017-08-06 MED ORDER — FUROSEMIDE 40 MG PO TABS: 40.0000 mg | ORAL_TABLET | Freq: Every day | ORAL | 3 refills | Status: DC

## 2017-08-06 MED ORDER — ANASTROZOLE 1 MG PO TABS
1.0000 mg | ORAL_TABLET | Freq: Every day | ORAL | Status: DC
  Administered 2017-08-07 – 2017-08-22 (×16): 1 mg via ORAL
  Filled 2017-08-06 (×18): qty 1

## 2017-08-06 MED ORDER — AMLODIPINE BESYLATE 10 MG PO TABS
10.0000 mg | ORAL_TABLET | Freq: Every day | ORAL | Status: DC
  Administered 2017-08-07 – 2017-08-22 (×16): 10 mg via ORAL
  Filled 2017-08-06 (×16): qty 1

## 2017-08-06 MED ORDER — INSULIN GLARGINE 100 UNIT/ML ~~LOC~~ SOLN
15.0000 [IU] | Freq: Every day | SUBCUTANEOUS | Status: DC
  Administered 2017-08-07 – 2017-08-22 (×16): 15 [IU] via SUBCUTANEOUS
  Filled 2017-08-06 (×17): qty 0.15

## 2017-08-06 MED ORDER — ASPIRIN EC 81 MG PO TBEC
81.0000 mg | DELAYED_RELEASE_TABLET | Freq: Every day | ORAL | Status: DC
  Administered 2017-08-07 – 2017-08-22 (×16): 81 mg via ORAL
  Filled 2017-08-06 (×16): qty 1

## 2017-08-06 MED ORDER — CARVEDILOL 12.5 MG PO TABS
12.5000 mg | ORAL_TABLET | Freq: Two times a day (BID) | ORAL | Status: DC
  Administered 2017-08-07 – 2017-08-09 (×5): 12.5 mg via ORAL
  Filled 2017-08-06 (×5): qty 1

## 2017-08-06 MED ORDER — CARVEDILOL 12.5 MG PO TABS: 12.5000 mg | ORAL_TABLET | Freq: Two times a day (BID) | ORAL | 2 refills | Status: DC

## 2017-08-06 MED ORDER — CLOPIDOGREL BISULFATE 75 MG PO TABS
75.0000 mg | ORAL_TABLET | Freq: Every day | ORAL | Status: DC
  Administered 2017-08-07 – 2017-08-22 (×16): 75 mg via ORAL
  Filled 2017-08-06 (×16): qty 1

## 2017-08-06 MED ORDER — QUETIAPINE FUMARATE 50 MG PO TABS: 50.0000 mg | ORAL_TABLET | Freq: Every day | ORAL | Status: DC

## 2017-08-06 MED ORDER — LOPERAMIDE HCL 2 MG PO CAPS
2.0000 mg | ORAL_CAPSULE | ORAL | Status: DC | PRN
  Administered 2017-08-12 – 2017-08-22 (×13): 2 mg via ORAL
  Filled 2017-08-06 (×14): qty 1

## 2017-08-06 MED ORDER — ACETAMINOPHEN 650 MG RE SUPP: 650.0000 mg | Freq: Four times a day (QID) | RECTAL | Status: DC | PRN

## 2017-08-06 NOTE — H&P (Signed)
Physical Medicine and Rehabilitation Admission H&P     HPI: [name] is a 72 year old right-handed female with history of CAD maintained on aspirin and Plavix, breast cancer maintained on arimidex, diabetes mellitus, chronic systolic congestive heart failure, CKD stage II.  Per chart review patient lives with her daughter.  Independent prior to admission.  Presented 07/23/2016 with progressive cough, chest pain and hypoxia.  She was found to be septic with large left lung infiltration started on IV antibiotic coverage for CAP.  She had a brief PEA arrest post bronchoscopy revealing alveolar hemorrhage.  Hospital course significant for progressive shortness of breath requiring intubation, cardiogenic shock requiring pressors, ARDS with acute blood loss anemia and progressive leukocytosis with fevers.  Infectious disease consulted for input and recommended 8-day course of Merrem to treat aspiration event.  Fevers did resolve tolerated extubation 07/31/2017.  She did have bouts of delirium and agitation and continued to improve.  Subcutaneous heparin for DVT prophylaxis.  Currently maintained on a dysphagia #2 thin liquid diet.  Physical and occupational therapy evaluations completed with recommendations of physical medicine rehab consult.  Patient was admitted for a comprehensive rehab program.  Review of Systems  Constitutional: Negative for chills and fever.  HENT: Negative for hearing loss.   Eyes: Negative for blurred vision and double vision.  Respiratory: Positive for cough and shortness of breath.   Cardiovascular: Positive for chest pain and leg swelling. Negative for palpitations.  Gastrointestinal: Positive for constipation. Negative for nausea.  Genitourinary: Negative for dysuria, flank pain and hematuria.  Musculoskeletal: Positive for back pain.  Skin: Negative for rash.  All other systems reviewed and are negative.  Past Medical History:  Diagnosis Date  . Abdominal  discomfort    Chronic N/V/D. Presumptive dx Crohn's dx per elevated p ANCA. Failed Entocort and Pentasa. Sep 2003 - ileocolectomy c anastomosis per Dr Deon Pilling 2/2 adhesions - path was hegative for Crohns. EGD, Sm bowel follow through (11/03), and an eteroclysis (10/03) were unrevealing. Cuases hypomag and hypocalcemia.  . Adnexal mass 8/03   s/p lap BSO (R ovarian fibroma) & lysis of adhesions  . Allergy    Seasonal  . Anemia    Multifactorial. Baseline HgB 10-11 ish. B12 def - 150 in 3/10. Fe Def - ferritin 35 3/10. Both are being repleted.  . Breast cancer ([place]) 03/16/13   right, 5 o'clock  . CAD (coronary artery disease) 1996   1996 - PTCA and angioplasty diagonal branch. 2000 - Rotoblator & angiopllasty of diagonal. 2006 - subendocardial AMI, DES to proximal LAD.Marland Kitchen Also had 90% stenosis in distal apical LAD. EF 55 with apical hypokinesis. Indefinite ASA and Plavix.  . CHF (congestive heart failure) ([place])   . Chronic kidney disease    Chronic renal insuff baseline Cr 1.2 - 1.4 ish.  . Chronic pain    CT 10/10 = Spinal stenosis L2 - S1.  . Diabetes mellitus    Insulin dependent  . Hx of radiation therapy 06/02/13- 07/16/13   right rbeast 4500 cGy 25 sessions, right breast boost 1600 cGy in 8 sessions  . Hyperlipidemia    Managed with both a statin and Welchol. Welchol stopped 2014 2/2 cost and started on fenofibrate   . Hypertension    2006 B renal arteries patent. 2003 MRA - no RAS. 2003 pheo W/U Dr Hassell Done reportedly negative.  . Hypoxia 07/23/2017  . Lupus ([place])   . Personal history of radiation therapy   . RBBB   .  Stroke [facility])    Incidental finding MRI 2002 L lacunar infarct  . Wears dentures    top   Past Surgical History:  Procedure Laterality Date  . ABDOMINAL HYSTERECTOMY    . BILATERAL SALPINGOOPHORECTOMY  8/03   Lap BSO (R ovarian fibroma) and adhesion lysis  . BOWEL RESECTION  2003   ileocolectomy with anastomosis 2/2 adhesions  . BREAST BIOPSY    . BREAST LUMPECTOMY  Right   . BREAST LUMPECTOMY WITH NEEDLE LOCALIZATION AND AXILLARY SENTINEL LYMPH NODE BX Right 04/22/2013   Procedure: BREAST LUMPECTOMY WITH NEEDLE LOCALIZATION AND AXILLARY SENTINEL LYMPH NODE BX;  Surgeon: Stark Klein, MD;  Location: [facility];  Service: General;  Laterality: Right;  . CARDIAC CATHETERIZATION     2 stents  . CHOLECYSTECTOMY    . HEMICOLECTOMY     R sided hemicolectomy  . HERNIA REPAIR     Ventral hernia repair  . PTCA  4/06   Family History  Problem Relation Age of Onset  . Pancreatic cancer Brother 78  . Cancer Mother   . Breast cancer Mother 79  . Lung cancer Maternal Aunt   . Breast cancer Maternal Aunt   . Prostate cancer Maternal Uncle   . Heart attack Maternal Grandmother   . Breast cancer Maternal Grandmother   . Kidney failure Maternal Aunt   . Hypertension Daughter    Social History:  reports that she has never smoked. She has never used smokeless tobacco. She reports that she does not drink alcohol or use drugs. Allergies:  Allergies  Allergen Reactions  . Percocet [Oxycodone-Acetaminophen] Other (See Comments)    headaches  . Diazepam Other (See Comments)    REACTION: Agitation  . Haloperidol Lactate Nausea And Vomiting  . Lorazepam Nausea Only  . Morphine Sulfate Other (See Comments)    REACTION: Agitation  . Propoxyphene Hcl Nausea And Vomiting  . Tramadol Hcl Nausea And Vomiting   Medications Prior to Admission  Medication Sig Dispense Refill  . acetaminophen (TYLENOL) 325 MG tablet Take 650 mg by mouth every 6 (six) hours as needed for mild pain.    Marland Kitchen amLODipine (NORVASC) 10 MG tablet Take 10 mg by mouth daily.    Marland Kitchen anastrozole (ARIMIDEX) 1 MG tablet Take 1 tablet (1 mg total) by mouth daily. 90 tablet 3  . aspirin EC 81 MG tablet Take 81 mg by mouth daily.    Marland Kitchen atorvastatin (LIPITOR) 40 MG tablet Take 1 tablet (40 mg total) by mouth at bedtime. 90 tablet 3  . cyanocobalamin (,VITAMIN B-12,) 1000 MCG/ML injection Inject  1 mL (1,000 mcg total) into the muscle every 30 (thirty) days. 1 mL 11  . cyclobenzaprine (FLEXERIL) 5 MG tablet TAKE 1 TABLET BY MOUTH AT  BEDTIME AS NEEDED FOR  MUSCLE SPASM(S) 90 tablet 0  . desloratadine (CLARINEX) 5 MG tablet Take 5 mg by mouth at bedtime as needed (allergies). Reported on 04/11/2015    . fenofibrate 54 MG tablet Take 54 mg by mouth daily.     . ferrous fumarate (HEMOCYTE - 106 MG FE) 325 (106 Fe) MG TABS tablet Take 1 tablet (106 mg of iron total) by mouth daily. 30 each 0  . furosemide (LASIX) 40 MG tablet Take 2 tablets (80 mg total) by mouth daily. 180 tablet 3  . gabapentin (NEURONTIN) 300 MG capsule Take 2 at bedtime, one in morning as needed (Patient taking differently: Take 300-600 mg by mouth See admin instructions. 375m by mouth in  am as needed for nerve pain, 663m by mouth in pm) 270 capsule 3  . hydrOXYzine (VISTARIL) 50 MG capsule TAKE 1 CAPSULE BY MOUTH ONCE DAILY AT BEDTIME AS NEEDED 90 capsule 3  . Insulin Detemir (LEVEMIR FLEXTOUCH) 100 UNIT/ML Pen Inject 36 UNITS SUBCUTANEOUSLY at the same time each day (Patient taking differently: Inject 20 Units into the skin daily. ) 15 mL 11  . Insulin Pen Needle (B-D UF III MINI PEN NEEDLES) 31G X 5 MM MISC Use to inject insulin one time a day 100 each 3  . loperamide (IMODIUM) 2 MG capsule TAKE ONE CAPSULE BY MOUTH ONCE DAILY AS NEEDED FOR DIARRHEA OR LOOSE STOOLS 30 capsule 2  . losartan (COZAAR) 50 MG tablet Take 1 tablet (50 mg total) by mouth daily. 90 tablet 3  . nitroGLYCERIN (NITROSTAT) 0.4 MG SL tablet Place 1 tablet (0.4 mg total) under the tongue every 5 (five) minutes as needed for chest pain. 25 tablet 3  . ONE TOUCH ULTRA TEST test strip USE TO CHECK BLOOD SUGAR 3  TO 4 TIMES DAILY 400 each 3  . potassium chloride (K-DUR) 10 MEQ tablet Take 2 tablets (20 mEq total) by mouth daily. 180 tablet 3  . repaglinide (PRANDIN) 2 MG tablet TAKE 1 TABLET BY MOUTH 3  TIMES A DAY BEFORE MEALS 270 tablet 3  . TRADJENTA 5  MG TABS tablet TAKE 1 TABLET BY MOUTH  DAILY 90 tablet 3  . ULORIC 40 MG tablet Take 1 tablet by mouth daily.    . carvedilol (COREG) 6.25 MG tablet Take 6.25 mg by mouth 2 (two) times daily.    . clopidogrel (PLAVIX) 75 MG tablet TAKE 1 TABLET BY MOUTH ONCE A DAY 90 tablet 3    Drug Regimen Review Drug regimen was reviewed and remains appropriate with no significant issues identified  Home: Home Living Family/patient expects to be discharged to:: Private residence Living Arrangements: Alone Available Help at Discharge: Family, Available 24 hours/day Type of Home: Apartment Home Access: Stairs to enter ECenterPoint Energyof Steps: 1 Home Layout: One level Bathroom Shower/Tub: TOptometrist Yes Home Equipment: None   Functional History: Prior Function Level of Independence: Independent Comments: pPrint production planner drove  Functional Status:  Mobility: Bed Mobility Overal bed mobility: Needs Assistance Bed Mobility: Sit to Supine Rolling: Max assist Sidelying to sit: Max assist Supine to sit: Max assist, +2 for physical assistance Sit to supine: Max assist General bed mobility comments: max assist to lift trunk Transfers Overall transfer level: Needs assistance Equipment used: Rolling walker (2 wheeled) Transfers: Sit to/from Stand, SW.W. Grainger IncTransfers Sit to Stand: Mod assist, Min assist Stand pivot transfers: Mod assist General transfer comment: sit-to-stand transfers x5 with progressively more assistance as fatigue from min to mod A; verbal cues for placement of hands and sequencing of steps. Ambulation/Gait Ambulation/Gait assistance: Mod assist Ambulation Distance (Feet): 5 Feet Assistive device: Rolling walker (2 wheeled) Gait Pattern/deviations: Step-to pattern, Decreased stride length, Decreased step length - right, Decreased step length - left, Decreased stance time - right, Decreased stance time - left,  Decreased weight shift to right, Decreased weight shift to left, Shuffle, Wide base of support, Trunk flexed General Gait Details: markedly decreased; fatigues quickly    ADL: ADL Overall ADL's : Needs assistance/impaired Eating/Feeding: Maximal assistance, Sitting Eating/Feeding Details (indicate cue type and reason): can bring cup with straw to mouth, attempted with spoon Grooming: Wash/dry face, Maximal assistance(blew her nose independently) Grooming Details (  indicate cue type and reason): able to complete oral care with oral suction using R hand Upper Body Bathing: Maximal assistance, Sitting Lower Body Bathing: Maximal assistance, Bed level Upper Body Dressing : Total assistance, Sitting Lower Body Dressing: Total assistance, Sitting/lateral leans Toileting- Clothing Manipulation and Hygiene: Total assistance, Bed level Toileting - Clothing Manipulation Details (indicate cue type and reason): pt on bed pan upon arrival Functional mobility during ADLs: Maximal assistance, +2 for physical assistance General ADL Comments: educated in elevating L UE on 2 pillows  Cognition: Cognition Overall Cognitive Status: Impaired/Different from baseline Orientation Level: Oriented to person, Oriented to place, Disoriented to time, Disoriented to situation Cognition Arousal/Alertness: Awake/alert Behavior During Therapy: Flat affect Overall Cognitive Status: Impaired/Different from baseline Area of Impairment: Orientation, Attention, Memory, Following commands, Safety/judgement, Awareness, Problem solving Orientation Level: Disoriented to, Time, Situation, Place Current Attention Level: Focused Memory: Decreased recall of precautions, Decreased short-term memory Following Commands: Follows one step commands with increased time Safety/Judgement: Decreased awareness of safety, Decreased awareness of deficits Awareness: Intellectual Problem Solving: Slow processing, Decreased initiation,  Difficulty sequencing, Requires verbal cues, Requires tactile cues General Comments: she did crack a couple of jokes today with a belly laugh. Difficult to assess due to: Intubated  Physical Exam: Blood pressure (!) 156/67, pulse 95, temperature 98.4 F (36.9 C), temperature source Oral, resp. rate 16, height _0  (1.626 m), weight 81.7 kg (180 lb 1.9 oz), SpO2 95 %. Physical Exam  Constitutional: No distress.  obese  HENT:  Head: Normocephalic and atraumatic.  Mouth/Throat: No oropharyngeal exudate.  Eyes: Pupils are equal, round, and reactive to light. EOM are normal.  Neck: No tracheal deviation present. No thyromegaly present.  Cardiovascular: Normal rate and regular rhythm. Exam reveals no friction rub.  No murmur heard. Respiratory: Effort normal. No respiratory distress. She has no wheezes. She has no rales.  GI: Soft. She exhibits no distension. There is no tenderness.  Musculoskeletal: She exhibits no edema.  Neurological:  Mood is flat .  Follows simple commands.  She can provide her name and age but not much else. Quite distractible. Delayed in processing. UE movement grossly 3-4/5. LE: 1-2/5HF, 2/5 KE and 3/5 ADF/PF----inconsistent due to effort  Psychiatric:  Flat, delayed    Results for orders placed or performed during the hospital encounter of 07/23/17 (from the past 48 hour(s))  Glucose, capillary     Status: Abnormal   Collection Time: 08/04/17 11:35 AM  Result Value Ref Range   Glucose-Capillary 196 (H) 65 - 99 mg/dL   Comment 1 Notify RN    Comment 2 Document in Chart   C difficile quick scan w PCR reflex     Status: None   Collection Time: 08/04/17 11:44 AM  Result Value Ref Range   C Diff antigen NEGATIVE NEGATIVE   C Diff toxin NEGATIVE NEGATIVE   C Diff interpretation No C. difficile detected.     Comment: Performed at [facility], [place] [address], [place], [place] [address]  Glucose, capillary     Status: Abnormal   Collection Time: 08/04/17   4:29 PM  Result Value Ref Range   Glucose-Capillary 230 (H) 65 - 99 mg/dL   Comment 1 Notify RN    Comment 2 Document in Chart   Glucose, capillary     Status: Abnormal   Collection Time: 08/04/17  9:55 PM  Result Value Ref Range   Glucose-Capillary 193 (H) 65 - 99 mg/dL  Glucose, capillary  Status: Abnormal   Collection Time: 08/05/17  1:06 AM  Result Value Ref Range   Glucose-Capillary 150 (H) 65 - 99 mg/dL  Glucose, capillary     Status: None   Collection Time: 08/05/17  4:09 AM  Result Value Ref Range   Glucose-Capillary 73 65 - 99 mg/dL  Basic metabolic panel     Status: Abnormal   Collection Time: 08/05/17  4:30 AM  Result Value Ref Range   Sodium 145 135 - 145 mmol/L   Potassium 3.1 (L) 3.5 - 5.1 mmol/L   Chloride 112 (H) 101 - 111 mmol/L   CO2 22 22 - 32 mmol/L   Glucose, Bld 70 65 - 99 mg/dL   BUN 36 (H) 6 - 20 mg/dL   Creatinine, Ser 1.14 (H) 0.44 - 1.00 mg/dL   Calcium 9.3 8.9 - 10.3 mg/dL   GFR calc non Af Amer 48 (L) >60 mL/min   GFR calc Af Amer 55 (L) >60 mL/min    Comment: (NOTE) The eGFR has been calculated using the CKD EPI equation. This calculation has not been validated in all clinical situations. eGFR's persistently <60 mL/min signify possible Chronic Kidney Disease.    Anion gap 11 5 - 15    Comment: Performed at [facility] [address] [place], [place] [address]  Glucose, capillary     Status: Abnormal   Collection Time: 08/05/17  8:01 AM  Result Value Ref Range   Glucose-Capillary 150 (H) 65 - 99 mg/dL   Comment 1 Notify RN   Glucose, capillary     Status: Abnormal   Collection Time: 08/05/17 11:56 AM  Result Value Ref Range   Glucose-Capillary 252 (H) 65 - 99 mg/dL  Glucose, capillary     Status: Abnormal   Collection Time: 08/05/17  4:35 PM  Result Value Ref Range   Glucose-Capillary 184 (H) 65 - 99 mg/dL  Glucose, capillary     Status: Abnormal   Collection Time: 08/05/17 10:15 PM  Result Value Ref Range    Glucose-Capillary 105 (H) 65 - 99 mg/dL  Basic metabolic panel     Status: Abnormal   Collection Time: 08/06/17  4:40 AM  Result Value Ref Range   Sodium 142 135 - 145 mmol/L   Potassium 3.9 3.5 - 5.1 mmol/L    Comment: DELTA CHECK NOTED   Chloride 115 (H) 101 - 111 mmol/L   CO2 19 (L) 22 - 32 mmol/L   Glucose, Bld 118 (H) 65 - 99 mg/dL   BUN 27 (H) 6 - 20 mg/dL   Creatinine, Ser 1.03 (H) 0.44 - 1.00 mg/dL   Calcium 9.0 8.9 - 10.3 mg/dL   GFR calc non Af Amer 54 (L) >60 mL/min   GFR calc Af Amer >60 >60 mL/min    Comment: (NOTE) The eGFR has been calculated using the CKD EPI equation. This calculation has not been validated in all clinical situations. eGFR's persistently <60 mL/min signify possible Chronic Kidney Disease.    Anion gap 8 5 - 15    Comment: Performed at [facility] [address], [place],  [address]  Glucose, capillary     Status: Abnormal   Collection Time: 08/06/17  7:27 AM  Result Value Ref Range   Glucose-Capillary 141 (H) 65 - 99 mg/dL   No results found.     Medical Problem List and Plan: 1.  Debility and encephalopathy secondary to ARDS/cardiogenic shock/PEA arrest /CAP/multi-medical-extubated 07/31/2017  -admit to inpatient  rehab 2.  DVT Prophylaxis/Anticoagulation: Subcutaneous heparin.  Monitor for any bleeding episodes 3. Pain Management: Tylenol as needed 4. Mood/delirium/insomnia: Seroquel 50 mg nightly--adjust to 75m and give at 8:30pm  -?continue Atarax 10 mg nightly----may be actually increasing confusion  -normalize sleep-wake, keep sleep chart 5. Neuropsych: This patient is not capable of making decisions on her own behalf. 6. Skin/Wound Care: Routine skin checks 7. Fluids/Electrolytes/Nutrition: Routine INO's with follow-up chemistries 8.  CAP/fevers.  8-day course of Merrem completed, afebrile at present 9.  Dysphagia.  Dysphagia #2 thin liquids.  Follow-up speech therapy  -encourage PO. Doesn't like d2 diet 10.   Chronic systolic congestive heart failure.  Monitor for any signs of fluid overload 11.  History of CAD.  Continue aspirin and Plavix 12.  CKD stage II.  Follow-up chemistries 13.  Hypertension.  Norvasc 10 mg daily, Coreg 12.5 mg twice daily, Cozaar 100 mg daily.  -blood pressures continue to run high, particularly SBP.   -consider increasing coreg 14.  Diabetes mellitus with peripheral neuropathy.  Lantus insulin 15 units nightly.  Check blood sugars before meals and at bedtime and follow for pattern 15.  History of breast cancer.  Continue arimidex  Post Admission Physician Evaluation: 1. Functional deficits secondary  to debility and encephalopathy. 2. Patient is admitted to receive collaborative, interdisciplinary care between the physiatrist, rehab nursing staff, and therapy team. 3. Patient's level of medical complexity and substantial therapy needs in context of that medical necessity cannot be provided at a lesser intensity of care such as a SNF. 4. Patient has experienced substantial functional loss from his/her baseline which was documented above under the "Functional History" and "Functional Status" headings.  Judging by the patient's diagnosis, physical exam, and functional history, the patient has potential for functional progress which will result in measurable gains while on inpatient rehab.  These gains will be of substantial and practical use upon discharge  in facilitating mobility and self-care at the household level. 5. Physiatrist will provide 24 hour management of medical needs as well as oversight of the therapy plan/treatment and provide guidance as appropriate regarding the interaction of the two. 6. The Preadmission Screening has been reviewed and patient status is unchanged unless otherwise stated above. 7. 24 hour rehab nursing will assist with bladder management, bowel management, safety, skin/wound care, disease management, medication administration, pain management and  patient education  and help integrate therapy concepts, techniques,education, etc. 8. PT will assess and treat for/with: Lower extremity strength, range of motion, stamina, balance, functional mobility, safety, adaptive techniques and equipment, NMR, cognitive perceptual rx, family ed.   Goals are: min assist. 9. OT will assess and treat for/with: ADL's, functional mobility, safety, upper extremity strength, adaptive techniques and equipment, NMR, family education, community reentry.   Goals are: min assist. Therapy may proceed with showering this patient. 10. SLP will assess and treat for/with: cognition, communication, swallowing.  Goals are: min assist. 11. Case Management and Social Worker will assess and treat for psychological issues and discharge planning. 12. Team conference will be held weekly to assess progress toward goals and to determine barriers to discharge. 13. Patient will receive at least 3 hours of therapy per day at least 5 days per week. 14. ELOS: 18-24 days       15. Prognosis:  excellent    I have personally performed a face to face diagnostic evaluation of this patient and formulated the key components of the plan.  Additionally, I have personally reviewed laboratory data, imaging  studies, as well as relevant notes and concur with the physician assistant's documentation above.  Meredith Staggers, MD, FAAPMR     Lavon Paganini [place], PA-C 08/06/2017

## 2017-08-06 NOTE — Progress Notes (Signed)
Inpatient Diabetes Program Recommendations  AACE/ADA: New Consensus Statement on Inpatient Glycemic Control (2015)  Target Ranges:  Prepandial:   less than 140 mg/dL      Peak postprandial:   less than 180 mg/dL (1-2 hours)      Critically ill patients:  140 - 180 mg/dL   Results for [name], [name] (MRN [id number]) as of 08/06/2017 10:45  Ref. Range 08/05/2017 08:01 08/05/2017 11:56 08/05/2017 16:35 08/05/2017 22:15 08/06/2017 07:27  Glucose-Capillary Latest Ref Range: 65 - 99 mg/dL 150 (H) 252 (H) 184 (H) 105 (H) 141 (H)   Review of Glycemic Control  Current orders for Inpatient glycemic control: Lantus 15 units daily, Novolog 0-20 units TID with meals  Inpatient Diabetes Program Recommendations:  Correction (SSI): Please consider ordering Novolog 0-5 units QHS for bedtime correction. Insulin - Meal Coverage: If post prandial glucose is consistently greater than 180 mg/dl, please consider ordering Novolog 2 units TID with meals for meal coverage if patient eats at least 50% of meals.  Thanks, Barnie Alderman, RN, MSN, CDE Diabetes Coordinator Inpatient Diabetes Program [phone] (Team Pager from 8am to 5pm)

## 2017-08-06 NOTE — Progress Notes (Signed)
Cristina Gong, RN  Rehab Admission Coordinator  Physical Medicine and Rehabilitation  PMR Pre-admission  Signed  Date of Service:  08/06/2017 3:23 PM       Related encounter: Admission (Current) from 07/23/2017 in [facility]      Signed           Show:Clear all [x] Manual[x] Template[x] Copied  Added by: [x] Cristina Gong, RN   [] Hover for details   PMR Admission Coordinator Pre-Admission Assessment  Patient: [name] is an 72 y.o., female MRN: [id number] DOB: Jun 16, 1945 Height: 5' 4"  (162.6 cm) Weight: 81.7 kg (180 lb 1.9 oz)                                                                                                                                                  Insurance Information HMO: yes    PPO:      PCP:      IPA:      80/20:      OTHER: Medicare advantage plan PRIMARY: [place] [employer]      Policy#: [id number]      Subscriber: pt CM Name: Orvan July      Phone#: [phone]     Fax#: [phone] Pre-Cert#: [id number]    Approved for 7 days with f/u with Vevelyn Royals  Employer:  Benefits:  Phone #: [phone]     Name: 08/06/2017 Eff. Date: 03/19/2017     Deduct: none      Out of Pocket Max: $4400      Life Max: none CIR: $345 co pay per day days 1 until 5      SNF: no co pay days 1 to 20; $160 co pay per days days 21-48; no co pay days 49-100 Outpatient: $40 co pay per visit     Co-Pay: visits per medical neccesity Home Health: 100%      Co-Pay: visits per medical neccesity DME: 80%     Co-Pay: 20% Providers: in network  SECONDARYLurena Joiner      Policy#: [id number]      Subscriber: pt  Medicaid Application Date:       Case Manager:  Disability Application Date:       Case Worker:   Emergency Contact Information         Contact Information    Name Relation Home Work Mobile   [place] Daughter [phone]    Hill,Carmen Daughter [phone]     Hattie Perch Daughter    [phone]     Current Medical History  Patient Admitting Diagnosis: debility  History of Present Illness:  HPI:Eriyah ODEAL WELDEN is a 72 year old right-handed female with history of CAD maintained on aspirin and Plavix, breast cancer maintained on arimidex, diabetes mellitus, chronic systolic congestive heart failure, CKD stage II.  Presented 07/23/2016 with progressive cough, chest pain and  hypoxia. She was found to be septic with large left lung infiltration started on IV antibiotic coverage for CAP. She had a brief PEA arrest post bronchoscopy revealing alveolar hemorrhage. Hospital course significant for progressive shortness of breath requiring intubation, cardiogenic shock requiring pressors, ARDS with acute blood loss anemia and progressive leukocytosis with fevers. Infectious disease consulted for input and recommended 8-day course of Merrem to treat aspiration event. Fevers did resolve tolerated extubation 07/31/2017. She did have bouts of delirium and agitation and continued to improve. Subcutaneous heparin for DVT prophylaxis. Currently maintained on a dysphagia #2 thin liquid diet. Physical and occupational therapy evaluations completed with recommendations of physical medicine rehab consult.   Past Medical History      Past Medical History:  Diagnosis Date  . Abdominal discomfort    Chronic N/V/D. Presumptive dx Crohn's dx per elevated p ANCA. Failed Entocort and Pentasa. Sep 2003 - ileocolectomy c anastomosis per Dr Deon Pilling 2/2 adhesions - path was hegative for Crohns. EGD, Sm bowel follow through (11/03), and an eteroclysis (10/03) were unrevealing. Cuases hypomag and hypocalcemia.  . Adnexal mass 8/03   s/p lap BSO (R ovarian fibroma) & lysis of adhesions  . Allergy    Seasonal  . Anemia    Multifactorial. Baseline HgB 10-11 ish. B12 def - 150 in 3/10. Fe Def - ferritin 35 3/10. Both are being repleted.  . Breast cancer ([place]) 03/16/13   right, 5 o'clock  .  CAD (coronary artery disease) 1996   1996 - PTCA and angioplasty diagonal branch. 2000 - Rotoblator & angiopllasty of diagonal. 2006 - subendocardial AMI, DES to proximal LAD.Marland Kitchen Also had 90% stenosis in distal apical LAD. EF 55 with apical hypokinesis. Indefinite ASA and Plavix.  . CHF (congestive heart failure) ([place])   . Chronic kidney disease    Chronic renal insuff baseline Cr 1.2 - 1.4 ish.  . Chronic pain    CT 10/10 = Spinal stenosis L2 - S1.  . Diabetes mellitus    Insulin dependent  . Hx of radiation therapy 06/02/13- 07/16/13   right rbeast 4500 cGy 25 sessions, right breast boost 1600 cGy in 8 sessions  . Hyperlipidemia    Managed with both a statin and Welchol. Welchol stopped 2014 2/2 cost and started on fenofibrate   . Hypertension    2006 B renal arteries patent. 2003 MRA - no RAS. 2003 pheo W/U Dr Hassell Done reportedly negative.  . Hypoxia 07/23/2017  . Lupus ([place])   . Personal history of radiation therapy   . RBBB   . Stroke [facility])    Incidental finding MRI 2002 L lacunar infarct  . Wears dentures    top    Family History  family history includes Breast cancer in her maternal aunt and maternal grandmother; Breast cancer (age of onset: 18) in her mother; Cancer in her mother; Heart attack in her maternal grandmother; Hypertension in her daughter; Kidney failure in her maternal aunt; Lung cancer in her maternal aunt; Pancreatic cancer (age of onset: 47) in her brother; Prostate cancer in her maternal uncle.  Prior Rehab/Hospitalizations:  Has the patient had major surgery during 100 days prior to admission? No  Current Medications   Current Facility-Administered Medications:  .  acetaminophen (TYLENOL) tablet 650 mg, 650 mg, Oral, Q6H PRN, 650 mg at 08/04/17 2236 **OR** acetaminophen (TYLENOL) suppository 650 mg, 650 mg, Rectal, Q6H PRN, Whiteheart, Kathryn A, NP .  albuterol (PROVENTIL) (2.5 MG/3ML) 0.083% nebulizer solution 2.5 mg, 2.5 mg,  Nebulization, Q4H  PRN, Marijean Heath, NP .  amLODipine (NORVASC) tablet 10 mg, 10 mg, Oral, Daily, Whiteheart, Kathryn A, NP, 10 mg at 08/06/17 0954 .  anastrozole (ARIMIDEX) tablet 1 mg, 1 mg, Oral, Daily, Darrick Meigs, Marge Duncans, MD, 1 mg at 08/06/17 0954 .  aspirin EC tablet 81 mg, 81 mg, Oral, Daily, Oswald Hillock, MD, 81 mg at 08/06/17 0955 .  carvedilol (COREG) tablet 12.5 mg, 12.5 mg, Oral, BID WC, Georgette Shell, MD, 12.5 mg at 08/06/17 0800 .  clopidogrel (PLAVIX) tablet 75 mg, 75 mg, Oral, Daily, Oswald Hillock, MD, 75 mg at 08/06/17 0954 .  famotidine (PEPCID) 40 MG/5ML suspension 20 mg, 20 mg, Oral, Daily, Rober Minion F, RPH, 20 mg at 08/06/17 1000 .  furosemide (LASIX) tablet 40 mg, 40 mg, Oral, Daily, Darrick Meigs, Marge Duncans, MD .  heparin injection 5,000 Units, 5,000 Units, Subcutaneous, Q8H, Whiteheart, Kathryn A, NP, 5,000 Units at 08/06/17 0449 .  hydrALAZINE (APRESOLINE) injection 10 mg, 10 mg, Intravenous, Q6H PRN, Oswald Hillock, MD, 10 mg at 08/06/17 0449 .  hydrOXYzine (ATARAX/VISTARIL) tablet 10 mg, 10 mg, Oral, QHS, Georgette Shell, MD, 10 mg at 08/05/17 2206 .  insulin aspart (novoLOG) injection 0-20 Units, 0-20 Units, Subcutaneous, TID WC, Oswald Hillock, MD, 4 Units at 08/06/17 1145 .  insulin glargine (LANTUS) injection 15 Units, 15 Units, Subcutaneous, Daily, Tarry Kos, MD, 15 Units at 08/06/17 0955 .  loperamide (IMODIUM) capsule 2 mg, 2 mg, Oral, PRN, Oswald Hillock, MD, 2 mg at 08/05/17 1044 .  losartan (COZAAR) tablet 100 mg, 100 mg, Oral, Daily, Georgette Shell, MD, 100 mg at 08/06/17 0954 .  MEDLINE mouth rinse, 15 mL, Mouth Rinse, BID, Whiteheart, Kathryn A, NP, 15 mL at 08/06/17 0959 .  phenol (CHLORASEPTIC) mouth spray 1 spray, 1 spray, Mouth/Throat, PRN, Whiteheart, Kathryn A, NP .  protein supplement (PREMIER PROTEIN) liquid, 11 oz, Oral, BID BM, Axel Filler, MD, 11 oz at 08/06/17 1000 .  QUEtiapine (SEROQUEL) tablet 50 mg, 50 mg,  Oral, QHS, Oswald Hillock, MD, 50 mg at 08/05/17 2206 .  sodium chloride flush (NS) 0.9 % injection 10-40 mL, 10-40 mL, Intracatheter, PRN, Thereasa Solo Kimberlee Nearing, MD  Facility-Administered Medications Ordered in Other Encounters:  .  0.9 %  sodium chloride infusion, , Intravenous, Continuous, Burgess Estelle, MD, Last Rate: 150 mL/hr at 10/03/15 1607  Patients Current Diet:       Diet Order           Diet - low sodium heart healthy        DIET DYS 2 Room service appropriate? 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 None of the above  Prior Functional Level Prior Function Level of Independence: Independent Comments: preschool teacher; drove  Self Care: Did the patient need help bathing, dressing, using the toilet or eating?  Independent  Indoor Mobility: Did the patient  need assistance with walking from room to room (with or without device)? Independent  Stairs: Did the patient need assistance with internal or external stairs (with or without device)? Independent  Functional Cognition: Did the patient need help planning regular tasks such as shopping or remembering to take medications? Independent  Current Functional Level Cognition  Overall Cognitive Status: Impaired/Different from baseline Difficult to assess due to: Intubated Current Attention Level:  Focused Orientation Level: Oriented to person, Oriented to place, Disoriented to time, Disoriented to situation Following Commands: Follows one step commands with increased time Safety/Judgement: Decreased awareness of safety, Decreased awareness of deficits General Comments: she did crack a couple of jokes today with a belly laugh.    Extremity Assessment (includes Sensation/Coordination)  Upper Extremity Assessment: Generalized weakness, LUE deficits/detail, RUE deficits/detail RUE Deficits / Details: shoulder grossly 2/5; elbow/wrist/hand 3/5 RUE Coordination: decreased fine motor, decreased gross motor LUE Deficits / Details: shoulder 2/5; elbow 3/5 hand 2/5 - edematous hand LUE Coordination: decreased fine motor, decreased gross motor  Lower Extremity Assessment: Defer to PT evaluation RLE Deficits / Details: PROM WFL, strength grossly 2/5 LLE Deficits / Details: PROM WFL, strength grossly 2/5    ADLs  Overall ADL's : Needs assistance/impaired Eating/Feeding: Maximal assistance, Sitting Eating/Feeding Details (indicate cue type and reason): can bring cup with straw to mouth, attempted with spoon Grooming: Wash/dry face, Maximal assistance(blew her nose independently) Grooming Details (indicate cue type and reason): able to complete oral care with oral suction using R hand Upper Body Bathing: Maximal assistance, Sitting Lower Body Bathing: Maximal assistance, Bed level Upper Body Dressing : Total assistance, Sitting Lower Body Dressing: Total assistance, Sitting/lateral leans Toileting- Clothing Manipulation and Hygiene: Total assistance, Bed level Toileting - Clothing Manipulation Details (indicate cue type and reason): pt on bed pan upon arrival Functional mobility during ADLs: Maximal assistance, +2 for physical assistance General ADL Comments: educated in elevating L UE on 2 pillows    Mobility  Overal bed mobility: Needs Assistance Bed Mobility: Sit to Supine Rolling:  Max assist Sidelying to sit: Max assist Supine to sit: Max assist, +2 for physical assistance Sit to supine: Max assist General bed mobility comments: max assist to lift trunk    Transfers  Overall transfer level: Needs assistance Equipment used: Rolling walker (2 wheeled) Transfers: Sit to/from Stand, [employer] Transfers Sit to Stand: Mod assist, Min assist Stand pivot transfers: Mod assist General transfer comment: sit-to-stand transfers x5 with progressively more assistance as fatigue from min to mod A; verbal cues for placement of hands and sequencing of steps.    Ambulation / Gait / Stairs / Wheelchair Mobility  Ambulation/Gait Ambulation/Gait assistance: Mod assist Ambulation Distance (Feet): 5 Feet Assistive device: Rolling walker (2 wheeled) Gait Pattern/deviations: Step-to pattern, Decreased stride length, Decreased step length - right, Decreased step length - left, Decreased stance time - right, Decreased stance time - left, Decreased weight shift to right, Decreased weight shift to left, Shuffle, Wide base of support, Trunk flexed General Gait Details: markedly decreased; fatigues quickly    Posture / Balance Dynamic Sitting Balance Sitting balance - Comments: no support x 15 minutes while pt ate and granddaughter combed her hair Balance Overall balance assessment: Needs assistance Sitting-balance support: Bilateral upper extremity supported, Feet supported Sitting balance-Leahy Scale: Fair Sitting balance - Comments: no support x 15 minutes while pt ate and granddaughter combed her hair Standing balance support: Bilateral upper extremity supported, During functional activity Standing balance-Leahy Scale: Poor    Special needs/care consideration BiPAP/CPAP  N/a CPM  N/a Continuous Drip IV  N/a Dialysis  N/a Life Vest n/a Oxygen  N/a Special Bed n/a Trach Size n/a Wound Vac n/a Skin intact Bowel mgmt: incontinent  Bladder mgmt:external catheter Diabetic  mgmt yes   Previous Home Environment Living Arrangements: (lives with a roommate; unsure of who this was)  Lives With: Friend(s) Available Help at Discharge: Family, Available 24 hours/day Type of Home: Apartment Home Layout: One level Home Access: Stairs to enter Technical brewer of Steps: 1 Bathroom Shower/Tub: Public librarian, Architectural technologist: Programmer, systems: Yes Home Care Services: No  Discharge Living Setting Plans for Discharge Living Setting: Patient's home, Lives with (comment)(family to take shifts to povide 24/7 assist) Type of Home at Discharge: Apartment Discharge Home Layout: One level Discharge Home Access: Stairs to enter Entrance Stairs-Rails: None Entrance Stairs-Number of Steps: 1 Discharge Bathroom Shower/Tub: Tub/shower unit, Curtain Discharge Bathroom Toilet: Standard Discharge Bathroom Accessibility: Yes How Accessible: Accessible via walker Does the patient have any problems obtaining your medications?: No  Social/Family/Support Systems Patient Roles: Parent, 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) Contact Information: Hattie Perch, daughter who is NP at [employer] Anticipated Caregiver: daugher and family are taking shifts and will hire assist from [employer] at d/c as needed to fill in the gaps Anticipated Caregiver's Contact Information: [phone] Ability/Limitations of Caregiver: family takes shifts and will hire assist Caregiver Availability: 24/7 Discharge Plan Discussed with Primary Caregiver: Yes Is Caregiver In Agreement with Plan?: Yes Does Caregiver/Family have Issues with Lodging/Transportation while Pt is in Rehab?: No(family stays with her 24/7 in hospital)  Goals/Additional Needs Patient/Family Goal for Rehab: min assist with PT, OT and SLP Expected length of stay: [facility]OS 18-24 days Pt/Family Agrees to Admission and willing to participate: Yes Program Orientation Provided & Reviewed  with Pt/Caregiver Including Roles  & Responsibilities: Yes  Decrease burden of Care through IP rehab admission: n/a  Possible need for SNF placement upon discharge:not anticipated  Patient Condition: This patient's medical and functional status has changed since the consult dated: 08/02/2017 in which the Rehabilitation Physician determined and documented that the patient's condition is appropriate for intensive rehabilitative care in an inpatient rehabilitation facility. See "History of Present Illness" (above) for medical update. Functional changes are: mod assist. Patient's medical and functional status update has been discussed with the Rehabilitation physician and patient remains appropriate for inpatient rehabilitation. Will admit to inpatient rehab today.  Preadmission Screen Completed By:  Cleatrice Burke, 08/06/2017 3:23 PM ______________________________________________________________________   Discussed status with Dr. Naaman Plummer on 08/06/2017 at  1529  and received telephone approval for admission today.  Admission Coordinator:  Cleatrice Burke, time 0973 Date 08/06/2017             Cosigned by: Meredith Staggers, MD at 08/06/2017 3:54 PM  Revision History

## 2017-08-06 NOTE — H&P (Signed)
Physical Medicine and Rehabilitation Admission H&P  HPI: [name] is a 72 year old right-handed female with history of CAD maintained on aspirin and Plavix, breast cancer maintained on arimidex, diabetes mellitus, chronic systolic congestive heart failure, CKD stage II. Per chart review patient lives with her daughter. Independent prior to admission. Presented 07/23/2016 with progressive cough, chest pain and hypoxia. She was found to be septic with large left lung infiltration started on IV antibiotic coverage for CAP. She had a brief PEA arrest post bronchoscopy revealing alveolar hemorrhage. Hospital course significant for progressive shortness of breath requiring intubation, cardiogenic shock requiring pressors, ARDS with acute blood loss anemia and progressive leukocytosis with fevers. Infectious disease consulted for input and recommended 8-day course of Merrem to treat aspiration event. Fevers did resolve tolerated extubation 07/31/2017. She did have bouts of delirium and agitation and continued to improve. Subcutaneous heparin for DVT prophylaxis. Currently maintained on a dysphagia #2 thin liquid diet. Physical and occupational therapy evaluations completed with recommendations of physical medicine rehab consult. Patient was admitted for a comprehensive rehab program.  Review of Systems  Constitutional: Negative for chills and fever.  HENT: Negative for hearing loss.  Eyes: Negative for blurred vision and double vision.  Respiratory: Positive for cough and shortness of breath.  Cardiovascular: Positive for chest pain and leg swelling. Negative for palpitations.  Gastrointestinal: Positive for constipation. Negative for nausea.  Genitourinary: Negative for dysuria, flank pain and hematuria.  Musculoskeletal: Positive for back pain.  Skin: Negative for rash.  All other systems reviewed and are negative.       Past Medical History:  Diagnosis Date  . Abdominal discomfort    Chronic N/V/D.  Presumptive dx Crohn's dx per elevated p ANCA. Failed Entocort and Pentasa. Sep 2003 - ileocolectomy c anastomosis per Dr Deon Pilling 2/2 adhesions - path was hegative for Crohns. EGD, Sm bowel follow through (11/03), and an eteroclysis (10/03) were unrevealing. Cuases hypomag and hypocalcemia.  . Adnexal mass 8/03   s/p lap BSO (R ovarian fibroma) & lysis of adhesions  . Allergy    Seasonal  . Anemia    Multifactorial. Baseline HgB 10-11 ish. B12 def - 150 in 3/10. Fe Def - ferritin 35 3/10. Both are being repleted.  . Breast cancer ([place]) 03/16/13   right, 5 o'clock  . CAD (coronary artery disease) 1996   1996 - PTCA and angioplasty diagonal branch. 2000 - Rotoblator & angiopllasty of diagonal. 2006 - subendocardial AMI, DES to proximal LAD.Marland Kitchen Also had 90% stenosis in distal apical LAD. EF 55 with apical hypokinesis. Indefinite ASA and Plavix.  . CHF (congestive heart failure) ([place])   . Chronic kidney disease    Chronic renal insuff baseline Cr 1.2 - 1.4 ish.  . Chronic pain    CT 10/10 = Spinal stenosis L2 - S1.  . Diabetes mellitus    Insulin dependent  . Hx of radiation therapy 06/02/13- 07/16/13   right rbeast 4500 cGy 25 sessions, right breast boost 1600 cGy in 8 sessions  . Hyperlipidemia    Managed with both a statin and Welchol. Welchol stopped 2014 2/2 cost and started on fenofibrate   . Hypertension    2006 B renal arteries patent. 2003 MRA - no RAS. 2003 pheo W/U Dr Hassell Done reportedly negative.  . Hypoxia 07/23/2017  . Lupus ([place])   . Personal history of radiation therapy   . RBBB   . Stroke [facility])    Incidental finding MRI 2002 L lacunar infarct  . Wears  dentures    top        Past Surgical History:  Procedure Laterality Date  . ABDOMINAL HYSTERECTOMY    . BILATERAL SALPINGOOPHORECTOMY  8/03   Lap BSO (R ovarian fibroma) and adhesion lysis  . BOWEL RESECTION  2003   ileocolectomy with anastomosis 2/2 adhesions  . BREAST BIOPSY    . BREAST LUMPECTOMY Right   . BREAST  LUMPECTOMY WITH NEEDLE LOCALIZATION AND AXILLARY SENTINEL LYMPH NODE BX Right 04/22/2013   Procedure: BREAST LUMPECTOMY WITH NEEDLE LOCALIZATION AND AXILLARY SENTINEL LYMPH NODE BX; Surgeon: Stark Klein, MD; Location: [place]; Service: General; Laterality: Right;  . CARDIAC CATHETERIZATION     2 stents  . CHOLECYSTECTOMY    . HEMICOLECTOMY     R sided hemicolectomy  . HERNIA REPAIR     Ventral hernia repair  . PTCA  4/06        Family History  Problem Relation Age of Onset  . Pancreatic cancer Brother 19  . Cancer Mother   . Breast cancer Mother 55  . Lung cancer Maternal Aunt   . Breast cancer Maternal Aunt   . Prostate cancer Maternal Uncle   . Heart attack Maternal Grandmother   . Breast cancer Maternal Grandmother   . Kidney failure Maternal Aunt   . Hypertension Daughter    Social History: reports that she has never smoked. She has never used smokeless tobacco. She reports that she does not drink alcohol or use drugs.  Allergies:       Allergies  Allergen Reactions  . Percocet [Oxycodone-Acetaminophen] Other (See Comments)    headaches  . Diazepam Other (See Comments)    REACTION: Agitation  . Haloperidol Lactate Nausea And Vomiting  . Lorazepam Nausea Only  . Morphine Sulfate Other (See Comments)    REACTION: Agitation  . Propoxyphene Hcl Nausea And Vomiting  . Tramadol Hcl Nausea And Vomiting         Medications Prior to Admission  Medication Sig Dispense Refill  . acetaminophen (TYLENOL) 325 MG tablet Take 650 mg by mouth every 6 (six) hours as needed for mild pain.    Marland Kitchen amLODipine (NORVASC) 10 MG tablet Take 10 mg by mouth daily.    Marland Kitchen anastrozole (ARIMIDEX) 1 MG tablet Take 1 tablet (1 mg total) by mouth daily. 90 tablet 3  . aspirin EC 81 MG tablet Take 81 mg by mouth daily.    Marland Kitchen atorvastatin (LIPITOR) 40 MG tablet Take 1 tablet (40 mg total) by mouth at bedtime. 90 tablet 3  . cyanocobalamin (,VITAMIN B-12,) 1000 MCG/ML injection Inject 1  mL (1,000 mcg total) into the muscle every 30 (thirty) days. 1 mL 11  . cyclobenzaprine (FLEXERIL) 5 MG tablet TAKE 1 TABLET BY MOUTH AT BEDTIME AS NEEDED FOR MUSCLE SPASM(S) 90 tablet 0  . desloratadine (CLARINEX) 5 MG tablet Take 5 mg by mouth at bedtime as needed (allergies). Reported on 04/11/2015    . fenofibrate 54 MG tablet Take 54 mg by mouth daily.     . ferrous fumarate (HEMOCYTE - 106 MG FE) 325 (106 Fe) MG TABS tablet Take 1 tablet (106 mg of iron total) by mouth daily. 30 each 0  . furosemide (LASIX) 40 MG tablet Take 2 tablets (80 mg total) by mouth daily. 180 tablet 3  . gabapentin (NEURONTIN) 300 MG capsule Take 2 at bedtime, one in morning as needed (Patient taking differently: Take 300-600 mg by mouth See admin instructions. 358m by mouth in  am as needed for nerve pain, 6103m by mouth in pm) 270 capsule 3  . hydrOXYzine (VISTARIL) 50 MG capsule TAKE 1 CAPSULE BY MOUTH ONCE DAILY AT BEDTIME AS NEEDED 90 capsule 3  . Insulin Detemir (LEVEMIR FLEXTOUCH) 100 UNIT/ML Pen Inject 36 UNITS SUBCUTANEOUSLY at the same time each day (Patient taking differently: Inject 20 Units into the skin daily. ) 15 mL 11  . Insulin Pen Needle (B-D UF III MINI PEN NEEDLES) 31G X 5 MM MISC Use to inject insulin one time a day 100 each 3  . loperamide (IMODIUM) 2 MG capsule TAKE ONE CAPSULE BY MOUTH ONCE DAILY AS NEEDED FOR DIARRHEA OR LOOSE STOOLS 30 capsule 2  . losartan (COZAAR) 50 MG tablet Take 1 tablet (50 mg total) by mouth daily. 90 tablet 3  . nitroGLYCERIN (NITROSTAT) 0.4 MG SL tablet Place 1 tablet (0.4 mg total) under the tongue every 5 (five) minutes as needed for chest pain. 25 tablet 3  . ONE TOUCH ULTRA TEST test strip USE TO CHECK BLOOD SUGAR 3 TO 4 TIMES DAILY 400 each 3  . potassium chloride (K-DUR) 10 MEQ tablet Take 2 tablets (20 mEq total) by mouth daily. 180 tablet 3  . repaglinide (PRANDIN) 2 MG tablet TAKE 1 TABLET BY MOUTH 3 TIMES A DAY BEFORE MEALS 270 tablet 3  . TRADJENTA 5 MG  TABS tablet TAKE 1 TABLET BY MOUTH DAILY 90 tablet 3  . ULORIC 40 MG tablet Take 1 tablet by mouth daily.    . carvedilol (COREG) 6.25 MG tablet Take 6.25 mg by mouth 2 (two) times daily.    . clopidogrel (PLAVIX) 75 MG tablet TAKE 1 TABLET BY MOUTH ONCE A DAY 90 tablet 3   Drug Regimen Review  Drug regimen was reviewed and remains appropriate with no significant issues identified  Home:  Home Living  Family/patient expects to be discharged to:: Private residence  Living Arrangements: Alone  Available Help at Discharge: Family, Available 24 hours/day  Type of Home: Apartment  Home Access: Stairs to enter  ECenter[employer] Energyof Steps: 1  Home Layout: One level  Bathroom Shower/Tub: TFutures trader Yes  Home Equipment: None  Functional History:  Prior Function  Level of Independence: Independent  Comments: pPrint production planner drove  Functional Status:  Mobility:  Bed Mobility  Overal bed mobility: Needs Assistance  Bed Mobility: Sit to Supine  Rolling: Max assist  Sidelying to sit: Max assist  Supine to sit: Max assist, +2 for physical assistance  Sit to supine: Max assist  General bed mobility comments: max assist to lift trunk  Transfers  Overall transfer level: Needs assistance  Equipment used: Rolling walker (2 wheeled)  Transfers: Sit to/from Stand, SW.W. Grainger IncTransfers  Sit to Stand: Mod assist, Min assist  Stand pivot transfers: Mod assist  General transfer comment: sit-to-stand transfers x5 with progressively more assistance as fatigue from min to mod A; verbal cues for placement of hands and sequencing of steps.  Ambulation/Gait  Ambulation/Gait assistance: Mod assist  Ambulation Distance (Feet): 5 Feet  Assistive device: Rolling walker (2 wheeled)  Gait Pattern/deviations: Step-to pattern, Decreased stride length, Decreased step length - right, Decreased step length - left, Decreased stance time - right,  Decreased stance time - left, Decreased weight shift to right, Decreased weight shift to left, Shuffle, Wide base of support, Trunk flexed  General Gait Details: markedly decreased; fatigues quickly   ADL:  ADL  Overall ADL's :  Needs assistance/impaired  Eating/Feeding: Maximal assistance, Sitting  Eating/Feeding Details (indicate cue type and reason): can bring cup with straw to mouth, attempted with spoon  Grooming: Wash/dry face, Maximal assistance(blew her nose independently)  Grooming Details (indicate cue type and reason): able to complete oral care with oral suction using R hand  Upper Body Bathing: Maximal assistance, Sitting  Lower Body Bathing: Maximal assistance, Bed level  Upper Body Dressing : Total assistance, Sitting  Lower Body Dressing: Total assistance, Sitting/lateral leans  Toileting- Clothing Manipulation and Hygiene: Total assistance, Bed level  Toileting - Clothing Manipulation Details (indicate cue type and reason): pt on bed pan upon arrival  Functional mobility during ADLs: Maximal assistance, +2 for physical assistance  General ADL Comments: educated in elevating L UE on 2 pillows  Cognition:  Cognition  Overall Cognitive Status: Impaired/Different from baseline  Orientation Level: Oriented to person, Oriented to place, Disoriented to time, Disoriented to situation  Cognition  Arousal/Alertness: Awake/alert  Behavior During Therapy: Flat affect  Overall Cognitive Status: Impaired/Different from baseline  Area of Impairment: Orientation, Attention, Memory, Following commands, Safety/judgement, Awareness, Problem solving  Orientation Level: Disoriented to, Time, Situation, Place  Current Attention Level: Focused  Memory: Decreased recall of precautions, Decreased short-term memory  Following Commands: Follows one step commands with increased time  Safety/Judgement: Decreased awareness of safety, Decreased awareness of deficits  Awareness: Intellectual    Problem Solving: Slow processing, Decreased initiation, Difficulty sequencing, Requires verbal cues, Requires tactile cues  General Comments: she did crack a couple of jokes today with a belly laugh.  Difficult to assess due to: Intubated  Physical Exam:  Blood pressure (!) 156/67, pulse 95, temperature 98.4 F (36.9 C), temperature source Oral, resp. rate 16, height 5' 4"  (1.626 m), weight 81.7 kg (180 lb 1.9 oz), SpO2 95 %.  Physical Exam  Constitutional: No distress.  obese  HENT:  Head: Normocephalic and atraumatic.  Mouth/Throat: No oropharyngeal exudate.  Eyes: Pupils are equal, round, and reactive to light. EOM are normal.  Neck: No tracheal deviation present. No thyromegaly present.  Cardiovascular: Normal rate and regular rhythm. Exam reveals no friction rub.  No murmur heard.  Respiratory: Effort normal. No respiratory distress. She has no wheezes. She has no rales.  GI: Soft. She exhibits no distension. There is no tenderness.  Musculoskeletal: She exhibits no edema.  Neurological:  Mood is flat . Follows simple commands. She can provide her name and age but not much else. Quite distractible. Delayed in processing. UE movement grossly 3-4/5. LE: 1-2/5HF, 2/5 KE and 3/5 ADF/PF----inconsistent due to effort  Psychiatric:  Flat, delayed   Lab Results Last 48 Hours  Imaging Results (Last 48 hours)     Medical Problem List and Plan:  1. Debility and  encephalopathy secondary to ARDS/cardiogenic shock/PEA arrest /CAP/multi-medical-extubated 07/31/2017  -admit to inpatient rehab  2. DVT Prophylaxis/Anticoagulation: Subcutaneous heparin. Monitor for any bleeding episodes  3. Pain Management: Tylenol as needed  4. Mood/delirium/insomnia: Seroquel 50 mg nightly--adjust to 57m and give at 8:30pm  -?continue Atarax 10 mg nightly----may be actually increasing confusion  -normalize sleep-wake, keep sleep chart  5. Neuropsych: This patient is not capable of making decisions on her own behalf.  6. Skin/Wound Care: Routine skin checks  7. Fluids/Electrolytes/Nutrition: Routine INO's with follow-up chemistries  8. CAP/fevers. 8-day course of Merrem completed, afebrile at present  9. Dysphagia. Dysphagia #2 thin liquids. Follow-up speech therapy  -encourage PO. Doesn't like d2 diet  10. Chronic systolic congestive heart failure. Monitor for any signs of fluid overload  11. History of CAD. Continue aspirin and Plavix  12. CKD stage II. Follow-up chemistries  13. Hypertension. Norvasc 10 mg daily, Coreg 12.5 mg twice daily, Cozaar 100 mg daily.  -blood pressures continue to run high, particularly SBP.  -consider increasing coreg  14. Diabetes mellitus with peripheral neuropathy. Lantus insulin 15 units nightly. Check blood sugars before meals and at bedtime and follow for pattern  15. History of breast cancer. Continue arimidex   Post Admission Physician Evaluation:  1. Functional deficits secondary to debility and encephalopathy. 2. Patient is admitted to receive collaborative, interdisciplinary care between the physiatrist, rehab nursing staff, and therapy team. 3. Patient's level of medical complexity and substantial therapy needs in context of that medical necessity cannot be provided at a lesser intensity of care such as a SNF. 4. Patient has experienced substantial functional loss from his/her baseline which was documented above under the  "Functional History" and "Functional Status" headings. Judging by the patient's diagnosis, physical exam, and functional history, the patient has potential for functional progress which will result in measurable gains while on inpatient rehab. These gains will be of substantial and practical use upon discharge in facilitating mobility and self-care at the household level. 5. Physiatrist will provide 24 hour management of medical needs as well as oversight of the therapy plan/treatment and provide guidance as appropriate regarding the interaction of the two. 6. The Preadmission Screening has been reviewed and patient status is unchanged unless otherwise stated above. 7. 24 hour rehab nursing will assist with bladder management, bowel management, safety, skin/wound care, disease management, medication administration, pain management and patient education and help integrate therapy concepts, techniques,education, etc. 8. PT will assess and treat for/with: Lower extremity strength, range of motion, stamina, balance, functional mobility, safety, adaptive techniques and equipment, NMR, cognitive perceptual rx, family ed. Goals are: min assist. 9. OT will assess and treat for/with: ADL's, functional mobility, safety, upper extremity strength, adaptive techniques and equipment, NMR, family education, community reentry. Goals are: min assist. Therapy may proceed with showering this patient. 10. SLP will assess and treat for/with: cognition, communication, swallowing. Goals are: min assist. 11. Case Management and Social Worker will assess and treat for psychological issues and discharge planning. 12. Team conference will be held weekly to assess progress toward goals and to determine barriers to discharge. 13. Patient will receive at least 3 hours of therapy per day at least 5 days per week. 14. ELOS: 18-24 days  15. Prognosis: excellent    I have personally performed a face to face diagnostic evaluation of  this patient and formulated the key components of the  plan. Additionally, I have personally reviewed laboratory data, imaging studies, as well as relevant notes and concur with the physician assistant's documentation above.    Meredith Staggers, MD, FAAPMR    Lavon Paganini [place], PA-C  08/06/2017

## 2017-08-06 NOTE — Discharge Summary (Signed)
Physician Discharge Summary  [name] WJX:[id number] DOB: 22-Nov-1945 DOA: 07/23/2017  PCP: Bartholomew Crews, MD  Admit date: 07/23/2017 Discharge date: 08/06/2017  Time spent: 50* minutes  Recommendations for Outpatient Follow-up:  1. Patient will be discharged to inpatient rehab   Discharge Diagnoses:  Active Problems:   SOB (shortness of breath)   Chest pain   Pneumonia of left lung due to infectious organism   Acute respiratory failure with hypoxemia (HCC)   Cardiac arrest, cause unspecified (HCC)   Shock circulatory (HCC)   Septic shock (HCC)   HCAP (healthcare-associated pneumonia)   Diffuse pulmonary alveolar hemorrhage   Pressure injury of skin   Discharge Condition: Stable  Diet recommendation: Dysphagia 2 diet with thin liquid  Filed Weights   08/04/17 0400 08/05/17 0500 08/06/17 0518  Weight: 81.6 kg (180 lb) 83.3 kg (183 lb 10.3 oz) 81.7 kg (180 lb 1.9 oz)    History of present illness:  72 year old woman with PMHx significant for breast cancer (on anastrozole), CAD s/p DES, HFpEF, CKD, DM, HTN prior CVA per family report who was initially admitted to [facility] directly from clinic due to hypoxic respiratory failure 2/2 multilobar community acquired pneumonia. She deteriorated overnight the evening of her admission and the following morning was subsequently intubated and transferred to the MICU. She had bronchoscopy by Dr Nelda Marseille on 5/8 and there was concern for alveolar hemmorhage based on the findings. Following intubation, she had junctional bradycardia with brief PEA arrest. This further required 3 pressors for her septic shock. She was able to come off pressers about 2 days later on 5/10. On 5/13 she developed an SBO which has since improved and nursing reported large bowel movements since. Her culture data has returned negative. She also had an autoimmune work up that was negative. Ultimately, she was extubated to nasal cannula on 5/15. Since  extubation, she has continued to have delirium here in the ICU and is globally weak and deconditioned. PT/OT evaluations are recommending CIR. There was concern regarding right sided weakness however, nursing reports this has improved    Hospital Course:  1. Acute hypoxic respiratory failure-secondary to multilobar pneumonia, mucous plugging.  Resolved.  Patient is on bronchodilators, antibiotics have been discontinued.  Completed 8 days of meropenem.  2. Brief PEA arrest following intubation-patient had bradycardic rhythm for less than 3 minutes.  Mild troponin leak on 07/27/2017.  Felt to be demand ischemia per cardiology.  3. History of CAD and diastolic heart failure-status post drug-eluting stent, she has preserved ejection fraction from echo on 07/27/2017.  Aspirin and Plavix is currently on hold.  Note from cardiology from 07/28/2017, recommended to hold aspirin and Plavix until ARDS resolved.  Review of PCCM note from 08/01/2017 recommended to start Plavix and aspirin in the next 24 to 48 hours.  I will restart aspirin and Plavix at this time.  Dose of Lasix has been changed to 40 mg p.o. Daily  4. Acute encephalopathy-likely metabolic but patient may have had anoxic injury from brief less than 3 minutes cardiac arrest.  Patient will need rehab.  Failure has been consulted.  patient started on Seroquel 50 mg p.o. nightly for hallucinations/agitation  5. Diabetes mellitus, type II, no acute complications-became hypoglycemic this morning,  continue sliding scale insulin with NovoLog.  Continue Lantus.  Will  discontinue NovoLog 4 units every 4 hours.  6. Stage IV right invasive ductal breast carcinoma-status post right breast lumpectomy and XRT in 2014.  Anastrozole was held  in the hospital.  Will restart anastrozole at this time.  7. Hypernatremia-sodium slowly improving, today 142.hypokalemia discontinued D5W.   8. Chronic kidney disease stage III-creatinine has improved.  Today creatinine  is 1.03.     9. Hypokalemia-replete potassium is 3.9.      Procedures: 5/7 admit 5/8 started on BiPAP and transferred to SDU. Brief junctional bradycardia w/ brief PEA arrest f/b cardiogenic & septic shock 3 pressor dependent >Intubated 5/10 Off pressors       Consultations:  PCCM  Discharge Exam: Vitals:   08/06/17 0518 08/06/17 0830  BP: (!) 156/67 (!) 177/75  Pulse: 95 98  Resp:  (!) 26  Temp:  98.9 F (37.2 C)  SpO2:  98%    General: Appears in no acute distress Cardiovascular: S1S2 RRR Respiratory: Clear bilaterally  Discharge Instructions    Allergies as of 08/06/2017      Reactions   Percocet [oxycodone-acetaminophen] Other (See Comments)   headaches   Diazepam Other (See Comments)   REACTION: Agitation   Haloperidol Lactate Nausea And Vomiting   Lorazepam Nausea Only   Morphine Sulfate Other (See Comments)   REACTION: Agitation   Propoxyphene Hcl Nausea And Vomiting   Tramadol Hcl Nausea And Vomiting      Medication List    TAKE these medications   acetaminophen 325 MG tablet Commonly known as:  TYLENOL Take 650 mg by mouth every 6 (six) hours as needed for mild pain.   amLODipine 10 MG tablet Commonly known as:  NORVASC Take 10 mg by mouth daily.   anastrozole 1 MG tablet Commonly known as:  ARIMIDEX Take 1 tablet (1 mg total) by mouth daily.   aspirin EC 81 MG tablet Take 81 mg by mouth daily.   atorvastatin 40 MG tablet Commonly known as:  LIPITOR Take 1 tablet (40 mg total) by mouth at bedtime.   carvedilol 12.5 MG tablet Commonly known as:  COREG Take 1 tablet (12.5 mg total) by mouth 2 (two) times daily with a meal. What changed:    medication strength  how much to take  when to take this   clopidogrel 75 MG tablet Commonly known as:  PLAVIX TAKE 1 TABLET BY MOUTH ONCE A DAY   cyanocobalamin 1000 MCG/ML injection Commonly known as:  (VITAMIN B-12) Inject 1 mL (1,000 mcg total) into the muscle every 30 (thirty)  days.   cyclobenzaprine 5 MG tablet Commonly known as:  FLEXERIL TAKE 1 TABLET BY MOUTH AT  BEDTIME AS NEEDED FOR  MUSCLE SPASM(S)   desloratadine 5 MG tablet Commonly known as:  CLARINEX Take 5 mg by mouth at bedtime as needed (allergies). Reported on 04/11/2015   fenofibrate 54 MG tablet Take 54 mg by mouth daily.   ferrous fumarate 325 (106 Fe) MG Tabs tablet Commonly known as:  HEMOCYTE - 106 mg FE Take 1 tablet (106 mg of iron total) by mouth daily.   furosemide 40 MG tablet Commonly known as:  LASIX Take 1 tablet (40 mg total) by mouth daily. What changed:  how much to take   gabapentin 300 MG capsule Commonly known as:  NEURONTIN Take 2 at bedtime, one in morning as needed What changed:    how much to take  how to take this  when to take this  additional instructions   hydrOXYzine 50 MG capsule Commonly known as:  VISTARIL TAKE 1 CAPSULE BY MOUTH ONCE DAILY AT BEDTIME AS NEEDED   Insulin Detemir 100 UNIT/ML Pen Commonly known  as:  LEVEMIR FLEXTOUCH Inject 36 UNITS SUBCUTANEOUSLY at the same time each day What changed:    how much to take  how to take this  when to take this  additional instructions   Insulin Pen Needle 31G X 5 MM Misc Commonly known as:  B-D UF III MINI PEN NEEDLES Use to inject insulin one time a day   loperamide 2 MG capsule Commonly known as:  IMODIUM TAKE ONE CAPSULE BY MOUTH ONCE DAILY AS NEEDED FOR DIARRHEA OR LOOSE STOOLS   losartan 50 MG tablet Commonly known as:  COZAAR Take 1 tablet (50 mg total) by mouth daily.   nitroGLYCERIN 0.4 MG SL tablet Commonly known as:  NITROSTAT Place 1 tablet (0.4 mg total) under the tongue every 5 (five) minutes as needed for chest pain.   ONE TOUCH ULTRA TEST test strip Generic drug:  glucose blood USE TO CHECK BLOOD SUGAR 3  TO 4 TIMES DAILY   potassium chloride 10 MEQ tablet Commonly known as:  K-DUR Take 2 tablets (20 mEq total) by mouth daily.   QUEtiapine 50 MG  tablet Commonly known as:  SEROQUEL Take 1 tablet (50 mg total) by mouth at bedtime.   repaglinide 2 MG tablet Commonly known as:  PRANDIN TAKE 1 TABLET BY MOUTH 3  TIMES A DAY BEFORE MEALS   TRADJENTA 5 MG Tabs tablet Generic drug:  linagliptin TAKE 1 TABLET BY MOUTH  DAILY   ULORIC 40 MG tablet Generic drug:  febuxostat Take 1 tablet by mouth daily.      Allergies  Allergen Reactions  . Percocet [Oxycodone-Acetaminophen] Other (See Comments)    headaches  . Diazepam Other (See Comments)    REACTION: Agitation  . Haloperidol Lactate Nausea And Vomiting  . Lorazepam Nausea Only  . Morphine Sulfate Other (See Comments)    REACTION: Agitation  . Propoxyphene Hcl Nausea And Vomiting  . Tramadol Hcl Nausea And Vomiting      The results of significant diagnostics from this hospitalization (including imaging, microbiology, ancillary and laboratory) are listed below for reference.    Significant Diagnostic Studies: Dg Chest 1 View  Result Date: 08/03/2017 CLINICAL DATA:  Shortness of Breath EXAM: CHEST  1 VIEW COMPARISON:  07/31/2017 FINDINGS: Diffuse left lung airspace opacity again noted, most confluent in the left upper lobe compatible with pneumonia. Right lung clear. No effusions. Heart is mildly enlarged. Left central line is unchanged. Interval removal of endotracheal tube and NG tube. IMPRESSION: Left lung airspace disease again noted most compatible with pneumonia. No significant change. Cardiomegaly. Electronically Signed   By: Rolm Baptise M.D.   On: 08/03/2017 16:06   Dg Chest 2 View  Result Date: 07/24/2017 CLINICAL DATA:  Pneumonia and shortness of breath EXAM: CHEST - 2 VIEW COMPARISON:  07/23/2017 FINDINGS: Cardiac shadow is stable but obscured by significant left-sided pulmonary infiltrate which is stable in appearance from the prior exam. The overall inspiratory effort is poor. No focal infiltrate is noted on the right. No sizable effusion is seen. IMPRESSION:  Diffuse stable left-sided pneumonia. Electronically Signed   By: Inez Catalina M.D.   On: 07/24/2017 07:34   Dg Abd 1 View  Result Date: 08/03/2017 CLINICAL DATA:  Ileus. EXAM: ABDOMEN - 1 VIEW COMPARISON:  07/31/2017 and prior exams FINDINGS: Colonic distension has decreased. A small amount of oral contrast within the colon noted. No dilated small bowel loops are present. No acute bony abnormalities are identified. IMPRESSION: Decreased colonic distention.  No evidence of  bowel obstruction. Electronically Signed   By: Margarette Canada M.D.   On: 08/03/2017 16:06   Dg Abd 1 View  Result Date: 07/29/2017 CLINICAL DATA:  Abdominal distension. EXAM: ABDOMEN - 1 VIEW COMPARISON:  07/20/2016 FINDINGS: Dilated small bowel loops are again noted compatible with small bowel obstruction. NG tube tip is in the distal stomach or proximal duodenum. Prior cholecystectomy. Overall bowel dilatation has increased since prior study. No free air. IMPRESSION: NG tube tip in the distal stomach or proximal duodenum. Worsening small bowel distention compatible with small bowel obstruction. Electronically Signed   By: Rolm Baptise M.D.   On: 07/29/2017 10:27   Dg Chest Port 1 View  Result Date: 07/31/2017 CLINICAL DATA:  Shortness of breath. EXAM: PORTABLE CHEST 1 VIEW COMPARISON:  Radiograph of Jul 30, 2017. FINDINGS: Stable cardiomediastinal silhouette. Endotracheal and nasogastric tubes are unchanged in position. Left subclavian catheter is unchanged in position. No pneumothorax is noted. Stable diffuse left lung opacity is noted concerning for pneumonia or edema. Stable mild right basilar subsegmental atelectasis is noted. Bony thorax is unremarkable. IMPRESSION: Stable support apparatus. Stable diffuse left lung opacity concerning for pneumonia or edema. Stable mild right basilar subsegmental atelectasis. Electronically Signed   By: Marijo Conception, M.D.   On: 07/31/2017 07:57   Dg Chest Port 1 View  Result Date:  07/30/2017 CLINICAL DATA:  History of endotracheal tube EXAM: PORTABLE CHEST 1 VIEW COMPARISON:  07/29/2017 FINDINGS: Endotracheal tube tip at the clavicular heads. Left subclavian line with tip at the SVC. An orogastric tube reaches the stomach at least. Cardiomegaly. Coronary stent present. Asymmetric airspace disease to the left. Postoperative right axilla. IMPRESSION: 1. History of pneumonia with unchanged left more than right airspace disease. 2. Stable hardware positioning. Electronically Signed   By: Monte Fantasia M.D.   On: 07/30/2017 07:48   Dg Chest Port 1 View  Result Date: 07/29/2017 CLINICAL DATA:  Follow-up pneumonia EXAM: PORTABLE CHEST 1 VIEW COMPARISON:  07/28/2017 FINDINGS: Endotracheal tube, nasogastric catheter and left subclavian central line are again seen and stable. Diffuse infiltrative changes noted throughout the left lung with mild basilar changes on the right. The overall appearance is stable from the prior study. No new focal abnormality is noted. IMPRESSION: No significant interval change from the previous day. Electronically Signed   By: Inez Catalina M.D.   On: 07/29/2017 07:08   Dg Chest Port 1 View  Result Date: 07/28/2017 CLINICAL DATA:  Pneumonia, coronary artery disease, history breast cancer, hypertension, lupus EXAM: PORTABLE CHEST 1 VIEW COMPARISON:  Portable exam 0654 hours compared to 07/27/2017 FINDINGS: Tip of endotracheal tube projects 4.3 cm above carina. Nasogastric tube extends into stomach. LEFT subclavian central venous catheter tip projects over SVC. Upper normal heart size. Extensive airspace infiltrate throughout LEFT lung especially LEFT upper lobe. Mild RIGHT basilar atelectasis. Increasing infiltrate within RIGHT lung. No pleural effusion or pneumothorax. Surgical clips RIGHT axilla. Bones unremarkable. IMPRESSION: Persistent LEFT and increasing RIGHT pulmonary infiltrates. Mild RIGHT basilar atelectasis. Electronically Signed   By: Lavonia Dana M.D.    On: 07/28/2017 08:09   Dg Chest Port 1 View  Result Date: 07/27/2017 CLINICAL DATA:  Pneumonia. EXAM: PORTABLE CHEST 1 VIEW COMPARISON:  Jul 26, 2017 FINDINGS: The ETT and left central line are stable. The NG tube terminates below today's film. No pneumothorax. A diffuse opacity in the left lung is stable. No focal opacity on the right. Stable cardiomediastinal silhouette. IMPRESSION: 1. Support apparatus as above. 2. Persistent diffuse  opacity throughout the left lung. Recommend follow-up to complete resolution. Electronically Signed   By: Dorise Bullion III M.D   On: 07/27/2017 08:14   Dg Chest Port 1 View  Result Date: 07/26/2017 CLINICAL DATA:  History of endotracheal tube EXAM: PORTABLE CHEST 1 VIEW COMPARISON:  Yesterday FINDINGS: Endotracheal tube tip at the clavicular heads. An orogastric tube reaches the stomach. Left subclavian line with tip at the SVC. Extensive airspace disease on the left has mildly improved since yesterday. There is cardiomegaly with coronary stent. No pneumothorax. IMPRESSION: 1. Stable positioning of tubes and central line. 2. Extensive airspace disease on the left with mild interval improvement. Electronically Signed   By: Monte Fantasia M.D.   On: 07/26/2017 11:28   Dg Chest Port 1 View  Result Date: 07/25/2017 CLINICAL DATA:  Pneumonia, intubated EXAM: PORTABLE CHEST 1 VIEW COMPARISON:  07/24/2017 FINDINGS: Endotracheal tube, left subclavian central line, and NG tube remain, all stable positions. Exam is rotated to the left. Dense consolidative airspace disease throughout the left lung with central air bronchograms. Little change in aeration. Minor right base atelectasis. Difficult to exclude left effusion. No significant pneumothorax. Axillary clips noted. IMPRESSION: Persistent consolidative and diffuse left lung airspace process compatible with pneumonia. No significant interval change. Electronically Signed   By: Jerilynn Mages.  Shick M.D.   On: 07/25/2017 10:18   Dg Chest  Port 1 View  Result Date: 07/24/2017 CLINICAL DATA:  Chest pain EXAM: PORTABLE CHEST 1 VIEW COMPARISON:  07/24/2017 FINDINGS: Endotracheal tube nasogastric catheter and left subclavian central line are again seen and stable. There is again significant opacification of the left hemithorax although some improved aeration in the apex and left mid lung laterally is noted. Right lung shows improved aeration when compared with the previous exam as well with only minimal central infiltrate identified. No bony abnormality is seen. IMPRESSION: Improving aeration bilaterally although significant left-sided infiltrate remains. Electronically Signed   By: Inez Catalina M.D.   On: 07/24/2017 15:26   Dg Chest Port 1 View  Result Date: 07/24/2017 CLINICAL DATA:  71 y/o F; central line placement and ET tube placement. EXAM: PORTABLE CHEST 1 VIEW COMPARISON:  07/23/2017 chest radiograph FINDINGS: Endotracheal tube tip 1.2 cm above the carina. Enteric tube tip below the field of view in the abdomen. Increased complete opacification of the left hemithorax patchy opacification in the right mid lung zone. Cardiac silhouette is largely obscured by left hemithorax opacification. Right axillary surgical clips. Bones are unremarkable. IMPRESSION: 1. Endotracheal tube tip 1.2 cm above carina. Enteric tube tip below field of view and abdomen. 2. Increased now complete opacification of left hemithorax and new mild right mid lung zone opacity. Electronically Signed   By: Kristine Garbe M.D.   On: 07/24/2017 13:37   Dg Chest Port 1 View  Result Date: 07/23/2017 CLINICAL DATA:  Shortness of breath. EXAM: PORTABLE CHEST 1 VIEW COMPARISON:  Radiographs of January 03, 2010. FINDINGS: Stable cardiomediastinal silhouette. No pneumothorax is noted. Right lung is clear. Large left lung airspace opacity is noted consistent with pneumonia. Some degree of pleural effusion cannot be excluded. Bony thorax is unremarkable. IMPRESSION: Large  left-sided pneumonia. Followup PA and lateral chest X-ray is recommended in 3-4 weeks following trial of antibiotic therapy to ensure resolution and exclude underlying malignancy. Electronically Signed   By: Marijo Conception, M.D.   On: 07/23/2017 19:25   Dg Abd Portable 1v  Result Date: 07/31/2017 CLINICAL DATA:  Abdominal distension. EXAM: PORTABLE ABDOMEN - 1  VIEW COMPARISON:  Radiographs of Jul 30, 2017. FINDINGS: Nasogastric tube is in expected position of duodenum. Status post cholecystectomy. No significant small bowel dilatation is noted. Stable gaseous distention of the colon is noted. Stool is noted in the rectum. IMPRESSION: Stable colonic distention. No definite evidence of bowel obstruction. Electronically Signed   By: Marijo Conception, M.D.   On: 07/31/2017 07:56   Dg Abd Portable 1v  Result Date: 07/30/2017 CLINICAL DATA:  Small-bowel obstruction, abdominal distension. EXAM: PORTABLE ABDOMEN - 1 VIEW COMPARISON:  Abdominal radiographs of Jul 29, 2017 FINDINGS: The esophagogastric tubes tip and proximal port lie within the duodenum. There remains moderate gaseous distention of colonic loops. No significant small bowel obstructive pattern is observed. There is some stool and gas in the rectum. No free extraluminal gas collections are observed. There are no abnormal soft tissue calcifications. There are degenerative changes of the lower lumbar spine. There surgical clips in the gallbladder fossa and at the right lung base. IMPRESSION: Decreased gaseous distention of small-bowel loops on today's study. Persistent mild gaseous distention of colonic loops. No free extraluminal gas collections. Electronically Signed   By: David  [name] M.D.   On: 07/30/2017 09:14   Dg Swallowing Func-speech Pathology  Result Date: 08/01/2017 Objective Swallowing Evaluation: Type of Study: MBS-Modified Barium Swallow Study  Patient Details Name: DEVOIRY CORRIHER MRN: [id number] Date of Birth: 07/20/1945 Today's Date:  08/01/2017 Time: SLP Start Time (ACUTE ONLY): 1345 -SLP Stop Time (ACUTE ONLY): 1359 SLP Time Calculation (min) (ACUTE ONLY): 14 min Past Medical History: Past Medical History: Diagnosis Date . Abdominal discomfort   Chronic N/V/D. Presumptive dx Crohn's dx per elevated p ANCA. Failed Entocort and Pentasa. Sep 2003 - ileocolectomy c anastomosis per Dr Deon Pilling 2/2 adhesions - path was hegative for Crohns. EGD, Sm bowel follow through (11/03), and an eteroclysis (10/03) were unrevealing. Cuases hypomag and hypocalcemia. . Adnexal mass 8/03  s/p lap BSO (R ovarian fibroma) & lysis of adhesions . Allergy   Seasonal . Anemia   Multifactorial. Baseline HgB 10-11 ish. B12 def - 150 in 3/10. Fe Def - ferritin 35 3/10. Both are being repleted. . Breast cancer ([place]) 03/16/13  right, 5 o'clock . CAD (coronary artery disease) 1996  1996 - PTCA and angioplasty diagonal branch. 2000 - Rotoblator & angiopllasty of diagonal. 2006 - subendocardial AMI, DES to proximal LAD.Marland Kitchen Also had 90% stenosis in distal apical LAD. EF 55 with apical hypokinesis. Indefinite ASA and Plavix. . CHF (congestive heart failure) ([place])  . Chronic kidney disease   Chronic renal insuff baseline Cr 1.2 - 1.4 ish. . Chronic pain   CT 10/10 = Spinal stenosis L2 - S1. . Diabetes mellitus   Insulin dependent . Hx of radiation therapy 06/02/13- 07/16/13  right rbeast 4500 cGy 25 sessions, right breast boost 1600 cGy in 8 sessions . Hyperlipidemia   Managed with both a statin and Welchol. Welchol stopped 2014 2/2 cost and started on fenofibrate  . Hypertension   2006 B renal arteries patent. 2003 MRA - no RAS. 2003 pheo W/U Dr Hassell Done reportedly negative. . Hypoxia 07/23/2017 . Lupus ([place])  . Personal history of radiation therapy  . RBBB  . Stroke [facility])   Incidental finding MRI 2002 L lacunar infarct . Wears dentures   top Past Surgical History: Past Surgical History: Procedure Laterality Date . ABDOMINAL HYSTERECTOMY   . BILATERAL SALPINGOOPHORECTOMY  8/03  Lap BSO (R  ovarian fibroma) and adhesion lysis . BOWEL RESECTION  2003  ileocolectomy with anastomosis 2/2 adhesions . BREAST BIOPSY   . BREAST LUMPECTOMY Right  . BREAST LUMPECTOMY WITH NEEDLE LOCALIZATION AND AXILLARY SENTINEL LYMPH NODE BX Right 04/22/2013  Procedure: BREAST LUMPECTOMY WITH NEEDLE LOCALIZATION AND AXILLARY SENTINEL LYMPH NODE BX;  Surgeon: Stark Klein, MD;  Location: [facility];  Service: General;  Laterality: Right; . CARDIAC CATHETERIZATION    2 stents . CHOLECYSTECTOMY   . HEMICOLECTOMY    R sided hemicolectomy . HERNIA REPAIR    Ventral hernia repair . PTCA  4/06 HPI: Pt is a 72 year old female who was admitted to [facility] 5/7 with hypoxia and pleuritic chest pain. She had a decline in respiratory status 5/8 with brief PEA arrest f/b cardiogenic and septic shock requiring pressors. ETT 5/8-5/15. PMH includes CAD, CHF, CKD, DM, HTN, Stage IA right invasive ductal breast carcinoma, lupus. No prior swallow evaluations documented, although review of prior documentation suggests chronic difficulty swallowing pills.  Subjective: pt alert, needs cues to follow commands Assessment / Plan / Recommendation CHL IP CLINICAL IMPRESSIONS 08/01/2017 Clinical Impression Pt has a mild-moderate oral dysphagia that appears cognitively based, otherwise with functional pharyngeal response. She has adequate oral function with liquids but when given bites of puree she exhibits oral holding. When cued to swallow it is piecemeal, and mild lingual residue persists even after the rest of the bolus is cleared. Mod cues from SLP and liquid washes assisted with oral clearance. For today would start with a full liquid diet. SLP will f/u for tolerance and readiness to advance. SLP Visit Diagnosis Dysphagia, oral phase (R13.11) Attention and concentration deficit following -- Frontal lobe and executive function deficit following -- Impact on safety and function Mild aspiration risk   CHL IP TREATMENT RECOMMENDATION  08/01/2017 Treatment Recommendations Therapy as outlined in treatment plan below   Prognosis 08/01/2017 Prognosis for Safe Diet Advancement Good Barriers to Reach Goals Cognitive deficits Barriers/Prognosis Comment -- CHL IP DIET RECOMMENDATION 08/01/2017 SLP Diet Recommendations Thin liquid;Other (Comment) Liquid Administration via Cup;Straw Medication Administration Crushed with puree Compensations Minimize environmental distractions;Slow rate;Small sips/bites Postural Changes Seated upright at 90 degrees   CHL IP OTHER RECOMMENDATIONS 08/01/2017 Recommended Consults -- Oral Care Recommendations Oral care BID Other Recommendations --   CHL IP FOLLOW UP RECOMMENDATIONS 08/01/2017 Follow up Recommendations (No Data)   CHL IP FREQUENCY AND DURATION 08/01/2017 Speech Therapy Frequency (ACUTE ONLY) min 2x/week Treatment Duration 2 weeks      [facility] IP ORAL PHASE 08/01/2017 Oral Phase Impaired Oral - Pudding Teaspoon -- Oral - Pudding Cup -- Oral - Honey Teaspoon -- Oral - Honey Cup -- Oral - Nectar Teaspoon -- Oral - Nectar Cup -- Oral - Nectar Straw -- Oral - Thin Teaspoon WFL Oral - Thin Cup WFL Oral - Thin Straw WFL Oral - Puree Holding of bolus;Piecemeal swallowing;Lingual/palatal residue Oral - Mech Soft -- Oral - Regular -- Oral - Multi-Consistency -- Oral - Pill -- Oral Phase - Comment --  [facility] IP PHARYNGEAL PHASE 08/01/2017 Pharyngeal Phase WFL Pharyngeal- Pudding Teaspoon -- Pharyngeal -- Pharyngeal- Pudding Cup -- Pharyngeal -- Pharyngeal- Honey Teaspoon -- Pharyngeal -- Pharyngeal- Honey Cup -- Pharyngeal -- Pharyngeal- Nectar Teaspoon -- Pharyngeal -- Pharyngeal- Nectar Cup -- Pharyngeal -- Pharyngeal- Nectar Straw -- Pharyngeal -- Pharyngeal- Thin Teaspoon -- Pharyngeal -- Pharyngeal- Thin Cup -- Pharyngeal -- Pharyngeal- Thin Straw -- Pharyngeal -- Pharyngeal- Puree -- Pharyngeal -- Pharyngeal- Mechanical Soft -- Pharyngeal -- Pharyngeal- Regular -- Pharyngeal -- Pharyngeal- Multi-consistency -- Pharyngeal --  Pharyngeal- Pill -- Pharyngeal -- Pharyngeal Comment --  [facility] IP CERVICAL ESOPHAGEAL PHASE 08/01/2017 Cervical Esophageal Phase WFL Pudding Teaspoon -- Pudding Cup -- Honey Teaspoon -- Honey Cup -- Nectar Teaspoon -- Nectar Cup -- Nectar Straw -- Thin Teaspoon -- Thin Cup -- Thin Straw -- Puree -- Mechanical Soft -- Regular -- Multi-consistency -- Pill -- Cervical Esophageal Comment -- No flowsheet data found. [name] 08/01/2017, 2:39 PM  [name], M.A. CCC-SLP [phone]              Microbiology: Recent Results (from the past 240 hour(s))  Respiratory Panel by PCR     Status: None   Collection Time: 07/27/17  2:37 PM  Result Value Ref Range Status   Adenovirus NOT DETECTED NOT DETECTED Final   Coronavirus 229E NOT DETECTED NOT DETECTED Final   Coronavirus HKU1 NOT DETECTED NOT DETECTED Final   Coronavirus NL63 NOT DETECTED NOT DETECTED Final   Coronavirus OC43 NOT DETECTED NOT DETECTED Final   Metapneumovirus NOT DETECTED NOT DETECTED Final   Rhinovirus / Enterovirus NOT DETECTED NOT DETECTED Final   Influenza A NOT DETECTED NOT DETECTED Corrected   Influenza B NOT DETECTED NOT DETECTED Final   Parainfluenza Virus 1 NOT DETECTED NOT DETECTED Final   Parainfluenza Virus 2 NOT DETECTED NOT DETECTED Final   Parainfluenza Virus 3 NOT DETECTED NOT DETECTED Final   Parainfluenza Virus 4 NOT DETECTED NOT DETECTED Final   Respiratory Syncytial Virus NOT DETECTED NOT DETECTED Final   Bordetella pertussis NOT DETECTED NOT DETECTED Final   Chlamydophila pneumoniae NOT DETECTED NOT DETECTED Final   Mycoplasma pneumoniae NOT DETECTED NOT DETECTED Final    Comment: Performed at [facility], [address] [place], [place] [address]  Culture, respiratory (NON-Expectorated)     Status: None   Collection Time: 07/28/17 11:26 AM  Result Value Ref Range Status   Specimen Description TRACHEAL ASPIRATE  Final   Special Requests NONE  Final   Gram Stain   Final    ABUNDANT WBC  PRESENT,BOTH PMN AND MONONUCLEAR NO SQUAMOUS EPITHELIAL CELLS SEEN NO ORGANISMS SEEN    Culture   Final    FEW Consistent with normal respiratory flora. Performed at [facility], [place] [address] [place], [place] [address]    Report Status 07/30/2017 FINAL  Final  Culture, Urine     Status: None   Collection Time: 07/28/17  1:11 PM  Result Value Ref Range Status   Specimen Description URINE, CLEAN CATCH  Final   Special Requests NONE  Final   Culture   Final    NO GROWTH Performed at [facility], [place] [address], [place], [place] [address]    Report Status 07/29/2017 FINAL  Final  Culture, blood (Routine X 2) w Reflex to ID Panel     Status: None   Collection Time: 07/28/17  3:01 PM  Result Value Ref Range Status   Specimen Description BLOOD LEFT ARM  Final   Special Requests   Final    BOTTLES DRAWN AEROBIC AND ANAEROBIC Blood Culture adequate volume   Culture   Final    NO GROWTH 5 DAYS Performed at [facility], [place] [address], [place], [place] [address]    Report Status 08/02/2017 FINAL  Final  Culture, blood (Routine X 2) w Reflex to ID Panel     Status: None   Collection Time: 07/28/17  3:25 PM  Result Value Ref Range Status   Specimen  Description BLOOD LEFT HAND  Final   Special Requests   Final    BOTTLES DRAWN AEROBIC AND ANAEROBIC Blood Culture adequate volume   Culture   Final    NO GROWTH 5 DAYS Performed at [facility], [address], [place], [place] [address]    Report Status 08/02/2017 FINAL  Final  C difficile quick scan w PCR reflex     Status: None   Collection Time: 08/04/17 11:44 AM  Result Value Ref Range Status   C Diff antigen NEGATIVE NEGATIVE Final   C Diff toxin NEGATIVE NEGATIVE Final   C Diff interpretation No C. difficile detected.  Final    Comment: Performed at [facility], [place] [address], [place], [place] [address]     Labs: Basic Metabolic Panel: Recent Labs  Lab 07/31/17 0434 08/01/17 0500  08/02/17 0405 08/03/17 0547 08/04/17 0445 08/05/17 0430 08/06/17 0440  NA 149* 150* 148* 149* 146* 145 142  K 3.6 3.1* 3.1* 3.2* 3.4* 3.1* 3.9  CL 105 110 113* 114* 113* 112* 115*  CO2 31 29 25 25 23 22  19*  GLUCOSE 297* 216* 172* 166* 159* 70 118*  BUN 101* 81* 63* 45* 41* 36* 27*  CREATININE 1.91* 1.67* 1.38* 1.20* 1.21* 1.14* 1.03*  CALCIUM 9.4 9.5 9.4 9.2 9.3 9.3 9.0  MG 2.5* 2.2 2.2  --  2.0  --   --   PHOS 4.1 2.8  --   --   --   --   --    Liver Function Tests: Recent Labs  Lab 07/31/17 0434  ALBUMIN 1.7*   No results for input(s): LIPASE, AMYLASE in the last 168 hours. No results for input(s): AMMONIA in the last 168 hours. CBC: Recent Labs  Lab 07/31/17 0434 08/01/17 0500 08/03/17 0547 08/04/17 0445  WBC 29.4* 31.9* 19.6* 18.9*  NEUTROABS  --   --   --  16.0*  HGB 8.9* 9.1* 9.3* 9.6*  HCT 28.9* 29.5* 29.6* 31.3*  MCV 82.1 81.0 81.8 80.7  PLT 187 209 213 229   Cardiac Enzymes: No results for input(s): CKTOTAL, CKMB, CKMBINDEX, TROPONINI in the last 168 hours. BNP: BNP (last 3 results) Recent Labs    10/11/16 1124 07/23/17 1214  BNP 56.5 176.4*    ProBNP (last 3 results) No results for input(s): PROBNP in the last 8760 hours.  CBG: Recent Labs  Lab 08/05/17 1156 08/05/17 1635 08/05/17 2215 08/06/17 0727 08/06/17 1132  GLUCAP 252* 184* 105* 141* 175*       Signed:  Oswald Hillock MD.  [facility] 08/06/2017, 2:11 PM

## 2017-08-06 NOTE — Progress Notes (Signed)
Physical Medicine and Rehabilitation Consult   Reason for Consult: Debility.  Referring Physician: Dr. Chase Caller.    HPI: [name] is a 72 y.o. female with history of CAD, breast cancer, chronic systolic CHF who was admitted on 07/23/16 with progressive cough, chest paina nd hypoxia. She was found to be septic with large Left lung infiltrated and was started as well as IV antibiotics for CAP.  She had brief PEA arrest post bronchoscopy revealing alveolar hemorrhage. Hospital course significant for progressive SOB requiring intubation, cardiogenic shock requiring pressors, ALI/ARDS with ABLA,  and progressive leucocytosis with fevers. ID consulted for input and recommended 8 day course of Merrem to treat aspiration event. Fevers resolving and she tolerated extubation on 05/15 and continues to have issue with delirium and agitation.  Family reports lack of sleep X 48 hours. MBS done yesterday  and she was started on liquid diet. Therapy evaluations done revealing diffuse weakness and CIR recommended due to functional deficits.    Review of Systems  Unable to perform ROS: Mental acuity  Respiratory: Negative for cough and shortness of breath.   Cardiovascular: Negative for chest pain.  Musculoskeletal: Positive for back pain.          Past Medical History:  Diagnosis Date  . Abdominal discomfort    Chronic N/V/D. Presumptive dx Crohn's dx per elevated p ANCA. Failed Entocort and Pentasa. Sep 2003 - ileocolectomy c anastomosis per Dr Deon Pilling 2/2 adhesions - path was hegative for Crohns. EGD, Sm bowel follow through (11/03), and an eteroclysis (10/03) were unrevealing. Cuases hypomag and hypocalcemia.  . Adnexal mass 8/03   s/p lap BSO (R ovarian fibroma) & lysis of adhesions  . Allergy    Seasonal  . Anemia    Multifactorial. Baseline HgB 10-11 ish. B12 def - 150 in 3/10. Fe Def - ferritin 35 3/10. Both are being repleted.  . Breast cancer ([place]) 03/16/13    right, 5 o'clock  . CAD (coronary artery disease) 1996   1996 - PTCA and angioplasty diagonal branch. 2000 - Rotoblator & angiopllasty of diagonal. 2006 - subendocardial AMI, DES to proximal LAD.Marland Kitchen Also had 90% stenosis in distal apical LAD. EF 55 with apical hypokinesis. Indefinite ASA and Plavix.  . CHF (congestive heart failure) ([place])   . Chronic kidney disease    Chronic renal insuff baseline Cr 1.2 - 1.4 ish.  . Chronic pain    CT 10/10 = Spinal stenosis L2 - S1.  . Diabetes mellitus    Insulin dependent  . Hx of radiation therapy 06/02/13- 07/16/13   right rbeast 4500 cGy 25 sessions, right breast boost 1600 cGy in 8 sessions  . Hyperlipidemia    Managed with both a statin and Welchol. Welchol stopped 2014 2/2 cost and started on fenofibrate   . Hypertension    2006 B renal arteries patent. 2003 MRA - no RAS. 2003 pheo W/U Dr Hassell Done reportedly negative.  . Hypoxia 07/23/2017  . Lupus ([place])   . Personal history of radiation therapy   . RBBB   . Stroke [facility])    Incidental finding MRI 2002 L lacunar infarct  . Wears dentures    top         Past Surgical History:  Procedure Laterality Date  . ABDOMINAL HYSTERECTOMY    . BILATERAL SALPINGOOPHORECTOMY  8/03   Lap BSO (R ovarian fibroma) and adhesion lysis  . BOWEL RESECTION  2003   ileocolectomy with anastomosis 2/2 adhesions  .  BREAST BIOPSY    . BREAST LUMPECTOMY Right   . BREAST LUMPECTOMY WITH NEEDLE LOCALIZATION AND AXILLARY SENTINEL LYMPH NODE BX Right 04/22/2013   Procedure: BREAST LUMPECTOMY WITH NEEDLE LOCALIZATION AND AXILLARY SENTINEL LYMPH NODE BX;  Surgeon: Stark Klein, MD;  Location: [place];  Service: General;  Laterality: Right;  . CARDIAC CATHETERIZATION     2 stents  . CHOLECYSTECTOMY    . HEMICOLECTOMY     R sided hemicolectomy  . HERNIA REPAIR     Ventral hernia repair  . PTCA  4/06         Family History  Problem Relation Age of Onset  .  Pancreatic cancer Brother 51  . Cancer Mother   . Breast cancer Mother 21  . Lung cancer Maternal Aunt   . Breast cancer Maternal Aunt   . Prostate cancer Maternal Uncle   . Heart attack Maternal Grandmother   . Breast cancer Maternal Grandmother   . Kidney failure Maternal Aunt   . Hypertension Daughter     Social History:  Lives alone. Independent and worked full time as a Chemical engineer. Per reports that she has never smoked. She has never used smokeless tobacco. Per reports that she does not drink alcohol or use drugs.         Allergies  Allergen Reactions  . Percocet [Oxycodone-Acetaminophen] Other (See Comments)    headaches  . Diazepam Other (See Comments)    REACTION: Agitation  . Haloperidol Lactate Nausea And Vomiting  . Lorazepam Nausea Only  . Morphine Sulfate Other (See Comments)    REACTION: Agitation  . Propoxyphene Hcl Nausea And Vomiting  . Tramadol Hcl Nausea And Vomiting          Medications Prior to Admission  Medication Sig Dispense Refill  . acetaminophen (TYLENOL) 325 MG tablet Take 650 mg by mouth every 6 (six) hours as needed for mild pain.    Marland Kitchen amLODipine (NORVASC) 10 MG tablet Take 10 mg by mouth daily.    Marland Kitchen anastrozole (ARIMIDEX) 1 MG tablet Take 1 tablet (1 mg total) by mouth daily. 90 tablet 3  . aspirin EC 81 MG tablet Take 81 mg by mouth daily.    Marland Kitchen atorvastatin (LIPITOR) 40 MG tablet Take 1 tablet (40 mg total) by mouth at bedtime. 90 tablet 3  . cyanocobalamin (,VITAMIN B-12,) 1000 MCG/ML injection Inject 1 mL (1,000 mcg total) into the muscle every 30 (thirty) days. 1 mL 11  . cyclobenzaprine (FLEXERIL) 5 MG tablet TAKE 1 TABLET BY MOUTH AT  BEDTIME AS NEEDED FOR  MUSCLE SPASM(S) 90 tablet 0  . desloratadine (CLARINEX) 5 MG tablet Take 5 mg by mouth at bedtime as needed (allergies). Reported on 04/11/2015    . fenofibrate 54 MG tablet Take 54 mg by mouth daily.     . ferrous fumarate (HEMOCYTE - 106 MG FE) 325  (106 Fe) MG TABS tablet Take 1 tablet (106 mg of iron total) by mouth daily. 30 each 0  . furosemide (LASIX) 40 MG tablet Take 2 tablets (80 mg total) by mouth daily. 180 tablet 3  . gabapentin (NEURONTIN) 300 MG capsule Take 2 at bedtime, one in morning as needed (Patient taking differently: Take 300-600 mg by mouth See admin instructions. 328m by mouth in am as needed for nerve pain, 6094mby mouth in pm) 270 capsule 3  . hydrOXYzine (VISTARIL) 50 MG capsule TAKE 1 CAPSULE BY MOUTH ONCE DAILY AT BEDTIME AS NEEDED 90 capsule  3  . Insulin Detemir (LEVEMIR FLEXTOUCH) 100 UNIT/ML Pen Inject 36 UNITS SUBCUTANEOUSLY at the same time each day (Patient taking differently: Inject 20 Units into the skin daily. ) 15 mL 11  . Insulin Pen Needle (B-D UF III MINI PEN NEEDLES) 31G X 5 MM MISC Use to inject insulin one time a day 100 each 3  . loperamide (IMODIUM) 2 MG capsule TAKE ONE CAPSULE BY MOUTH ONCE DAILY AS NEEDED FOR DIARRHEA OR LOOSE STOOLS 30 capsule 2  . losartan (COZAAR) 50 MG tablet Take 1 tablet (50 mg total) by mouth daily. 90 tablet 3  . nitroGLYCERIN (NITROSTAT) 0.4 MG SL tablet Place 1 tablet (0.4 mg total) under the tongue every 5 (five) minutes as needed for chest pain. 25 tablet 3  . ONE TOUCH ULTRA TEST test strip USE TO CHECK BLOOD SUGAR 3  TO 4 TIMES DAILY 400 each 3  . potassium chloride (K-DUR) 10 MEQ tablet Take 2 tablets (20 mEq total) by mouth daily. 180 tablet 3  . repaglinide (PRANDIN) 2 MG tablet TAKE 1 TABLET BY MOUTH 3  TIMES A DAY BEFORE MEALS 270 tablet 3  . TRADJENTA 5 MG TABS tablet TAKE 1 TABLET BY MOUTH  DAILY 90 tablet 3  . ULORIC 40 MG tablet Take 1 tablet by mouth daily.    . carvedilol (COREG) 6.25 MG tablet Take 6.25 mg by mouth 2 (two) times daily.    . clopidogrel (PLAVIX) 75 MG tablet TAKE 1 TABLET BY MOUTH ONCE A DAY 90 tablet 3    Home: Home Living Family/patient expects to be discharged to:: Private residence Living Arrangements: Alone Available  Help at Discharge: Family, Available 24 hours/day Type of Home: Apartment Home Access: Stairs to enter Technical brewer of Steps: 1 Home Layout: One level Bathroom Shower/Tub: Optometrist: Yes Home Equipment: None  Functional History: Prior Function Level of Independence: Independent Comments: Print production planner; drove Functional Status:  Mobility: Bed Mobility Overal bed mobility: Needs Assistance Bed Mobility: Supine to Sit, Sit to Supine Supine to sit: Max assist, +2 for physical assistance Sit to supine: +2 for physical assistance, Total assist General bed mobility comments: Did not attempt due to decreased strength  Transfers Overall transfer level: Needs assistance Transfers: Sit to/from Stand Sit to Stand: +2 physical assistance, Max assist  ADL: ADL Overall ADL's : Needs assistance/impaired Eating/Feeding: NPO Grooming: Maximal assistance, Sitting Grooming Details (indicate cue type and reason): able to complete oral care with oral suction using R hand Upper Body Bathing: Maximal assistance, Sitting Lower Body Bathing: Maximal assistance, Bed level Upper Body Dressing : Total assistance, Sitting Lower Body Dressing: Total assistance, Bed level Functional mobility during ADLs: Maximal assistance, +2 for physical assistance  Cognition: Cognition Overall Cognitive Status: Impaired/Different from baseline Orientation Level: Oriented to person, Disoriented to place, Disoriented to time, Disoriented to situation Cognition Arousal/Alertness: Awake/alert Behavior During Therapy: Flat affect Overall Cognitive Status: Impaired/Different from baseline Area of Impairment: Awareness, Orientation, Attention, Memory, Following commands, Safety/judgement, Problem solving Orientation Level: Disoriented to, Time, Situation, Place Current Attention Level: Sustained Memory: Decreased recall of precautions, Decreased  short-term memory Following Commands: Follows one step commands with increased time Safety/Judgement: Decreased awareness of safety, Decreased awareness of deficits Awareness: Intellectual Problem Solving: Slow processing, Decreased initiation, Difficulty sequencing, Requires verbal cues, Requires tactile cues General Comments: Easily distracted; more alert in upright position; recognized family members and accurate with 3/3 names; Max A +2 to stand and pt states she thinks  she could getup by herself Difficult to assess due to: Intubated   Blood pressure (!) 168/66, pulse 80, temperature 98.6 F (37 C), temperature source Oral, resp. rate (!) 23, height 5' 4"  (1.626 m), weight 81.7 kg (180 lb 1.9 oz), SpO2 99 %. Physical Exam  Nursing note and vitals reviewed. Constitutional: She appears well-nourished. No distress.  Sitting up in bed. NAD.   HENT:  Head: Normocephalic.  Eyes: Pupils are equal, round, and reactive to light.  Neck: Normal range of motion.  Cardiovascular: Normal rate.  Respiratory: Effort normal.  Decreased BS on left.   GI: Soft.  Musculoskeletal: She exhibits edema.  Neurological:  Confused and hallucinating. Oriented to self only. she thought that daughter in room was her granddaughter despite max cues. UE 2/5 prox to 3/5 distally, LE 1-2/5 prox to 2-3/5 distally.   Psychiatric:  Pt flat, cooperates with extra time to process    LabResultsLast24Hours       Results for orders placed or performed during the hospital encounter of 07/23/17 (from the past 24 hour(s))  Glucose, capillary     Status: Abnormal   Collection Time: 08/01/17 11:41 AM  Result Value Ref Range   Glucose-Capillary 258 (H) 65 - 99 mg/dL  Glucose, capillary     Status: Abnormal   Collection Time: 08/01/17  3:30 PM  Result Value Ref Range   Glucose-Capillary 284 (H) 65 - 99 mg/dL  Glucose, capillary     Status: Abnormal   Collection Time: 08/01/17  7:38 PM  Result Value Ref  Range   Glucose-Capillary 196 (H) 65 - 99 mg/dL  Glucose, capillary     Status: Abnormal   Collection Time: 08/01/17 11:54 PM  Result Value Ref Range   Glucose-Capillary 159 (H) 65 - 99 mg/dL  Glucose, capillary     Status: Abnormal   Collection Time: 08/02/17  4:03 AM  Result Value Ref Range   Glucose-Capillary 145 (H) 65 - 99 mg/dL  Basic metabolic panel     Status: Abnormal   Collection Time: 08/02/17  4:05 AM  Result Value Ref Range   Sodium 148 (H) 135 - 145 mmol/L   Potassium 3.1 (L) 3.5 - 5.1 mmol/L   Chloride 113 (H) 101 - 111 mmol/L   CO2 25 22 - 32 mmol/L   Glucose, Bld 172 (H) 65 - 99 mg/dL   BUN 63 (H) 6 - 20 mg/dL   Creatinine, Ser 1.38 (H) 0.44 - 1.00 mg/dL   Calcium 9.4 8.9 - 10.3 mg/dL   GFR calc non Af Amer 38 (L) >60 mL/min   GFR calc Af Amer 44 (L) >60 mL/min   Anion gap 10 5 - 15  Magnesium     Status: None   Collection Time: 08/02/17  4:05 AM  Result Value Ref Range   Magnesium 2.2 1.7 - 2.4 mg/dL  Glucose, capillary     Status: Abnormal   Collection Time: 08/02/17  7:44 AM  Result Value Ref Range   Glucose-Capillary 156 (H) 65 - 99 mg/dL      ImagingResults(Last48hours)  Dg Swallowing Func-speech Pathology  Result Date: 08/01/2017 Objective Swallowing Evaluation: Type of Study: MBS-Modified Barium Swallow Study  Patient Details Name: KLANI CARIDI MRN: [id number] Date of Birth: 1946-02-18 Today's Date: 08/01/2017 Time: SLP Start Time (ACUTE ONLY): 1345 -SLP Stop Time (ACUTE ONLY): 1359 SLP Time Calculation (min) (ACUTE ONLY): 14 min Past Medical History: Past Medical History: Diagnosis Date . Abdominal discomfort   Chronic N/V/D. Presumptive dx  Crohn's dx per elevated p ANCA. Failed Entocort and Pentasa. Sep 2003 - ileocolectomy c anastomosis per Dr Deon Pilling 2/2 adhesions - path was hegative for Crohns. EGD, Sm bowel follow through (11/03), and an eteroclysis (10/03) were unrevealing. Cuases hypomag and hypocalcemia. . Adnexal mass  8/03  s/p lap BSO (R ovarian fibroma) & lysis of adhesions . Allergy   Seasonal . Anemia   Multifactorial. Baseline HgB 10-11 ish. B12 def - 150 in 3/10. Fe Def - ferritin 35 3/10. Both are being repleted. . Breast cancer ([place]) 03/16/13  right, 5 o'clock . CAD (coronary artery disease) 1996  1996 - PTCA and angioplasty diagonal branch. 2000 - Rotoblator & angiopllasty of diagonal. 2006 - subendocardial AMI, DES to proximal LAD.Marland Kitchen Also had 90% stenosis in distal apical LAD. EF 55 with apical hypokinesis. Indefinite ASA and Plavix. . CHF (congestive heart failure) ([place])  . Chronic kidney disease   Chronic renal insuff baseline Cr 1.2 - 1.4 ish. . Chronic pain   CT 10/10 = Spinal stenosis L2 - S1. . Diabetes mellitus   Insulin dependent . Hx of radiation therapy 06/02/13- 07/16/13  right rbeast 4500 cGy 25 sessions, right breast boost 1600 cGy in 8 sessions . Hyperlipidemia   Managed with both a statin and Welchol. Welchol stopped 2014 2/2 cost and started on fenofibrate  . Hypertension   2006 B renal arteries patent. 2003 MRA - no RAS. 2003 pheo W/U Dr Hassell Done reportedly negative. . Hypoxia 07/23/2017 . Lupus ([place])  . Personal history of radiation therapy  . RBBB  . Stroke [facility])   Incidental finding MRI 2002 L lacunar infarct . Wears dentures   top Past Surgical History: Past Surgical History: Procedure Laterality Date . ABDOMINAL HYSTERECTOMY   . BILATERAL SALPINGOOPHORECTOMY  8/03  Lap BSO (R ovarian fibroma) and adhesion lysis . BOWEL RESECTION  2003  ileocolectomy with anastomosis 2/2 adhesions . BREAST BIOPSY   . BREAST LUMPECTOMY Right  . BREAST LUMPECTOMY WITH NEEDLE LOCALIZATION AND AXILLARY SENTINEL LYMPH NODE BX Right 04/22/2013  Procedure: BREAST LUMPECTOMY WITH NEEDLE LOCALIZATION AND AXILLARY SENTINEL LYMPH NODE BX;  Surgeon: Stark Klein, MD;  Location: [place];  Service: General;  Laterality: Right; . CARDIAC CATHETERIZATION    2 stents . CHOLECYSTECTOMY   . HEMICOLECTOMY    R sided  hemicolectomy . HERNIA REPAIR    Ventral hernia repair . PTCA  4/06 HPI: Pt is a 72 year old female who was admitted [facility] 5/7 with hypoxia and pleuritic chest pain. She had a decline in respiratory status 5/8 with brief PEA arrest f/b cardiogenic and septic shock requiring pressors. ETT 5/8-5/15. PMH includes CAD, CHF, CKD, DM, HTN, Stage IA right invasive ductal breast carcinoma, lupus. No prior swallow evaluations documented, although review of prior documentation suggests chronic difficulty swallowing pills.  Subjective: pt alert, needs cues to follow commands Assessment / Plan / Recommendation CHL IP CLINICAL IMPRESSIONS 08/01/2017 Clinical Impression Pt has a mild-moderate oral dysphagia that appears cognitively based, otherwise with functional pharyngeal response. She has adequate oral function with liquids but when given bites of puree she exhibits oral holding. When cued to swallow it is piecemeal, and mild lingual residue persists even after the rest of the bolus is cleared. Mod cues from SLP and liquid washes assisted with oral clearance. For today would start with a full liquid diet. SLP will f/u for tolerance and readiness to advance. SLP Visit Diagnosis Dysphagia, oral phase (R13.11) Attention and concentration deficit following --  Frontal lobe and executive function deficit following -- Impact on safety and function Mild aspiration risk   CHL IP TREATMENT RECOMMENDATION 08/01/2017 Treatment Recommendations Therapy as outlined in treatment plan below   Prognosis 08/01/2017 Prognosis for Safe Diet Advancement Good Barriers to Reach Goals Cognitive deficits Barriers/Prognosis Comment -- CHL IP DIET RECOMMENDATION 08/01/2017 SLP Diet Recommendations Thin liquid;Other (Comment) Liquid Administration via Cup;Straw Medication Administration Crushed with puree Compensations Minimize environmental distractions;Slow rate;Small sips/bites Postural Changes Seated upright at 90 degrees   CHL IP OTHER  RECOMMENDATIONS 08/01/2017 Recommended Consults -- Oral Care Recommendations Oral care BID Other Recommendations --   CHL IP FOLLOW UP RECOMMENDATIONS 08/01/2017 Follow up Recommendations (No Data)   CHL IP FREQUENCY AND DURATION 08/01/2017 Speech Therapy Frequency (ACUTE ONLY) min 2x/week Treatment Duration 2 weeks      CHL IP ORAL PHASE 08/01/2017 Oral Phase Impaired Oral - Pudding Teaspoon -- Oral - Pudding Cup -- Oral - Honey Teaspoon -- Oral - Honey Cup -- Oral - Nectar Teaspoon -- Oral - Nectar Cup -- Oral - Nectar Straw -- Oral - Thin Teaspoon WFL Oral - Thin Cup WFL Oral - Thin Straw WFL Oral - Puree Holding of bolus;Piecemeal swallowing;Lingual/palatal residue Oral - Mech Soft -- Oral - Regular -- Oral - Multi-Consistency -- Oral - Pill -- Oral Phase - Comment --  CHL IP PHARYNGEAL PHASE 08/01/2017 Pharyngeal Phase WFL Pharyngeal- Pudding Teaspoon -- Pharyngeal -- Pharyngeal- Pudding Cup -- Pharyngeal -- Pharyngeal- Honey Teaspoon -- Pharyngeal -- Pharyngeal- Honey Cup -- Pharyngeal -- Pharyngeal- Nectar Teaspoon -- Pharyngeal -- Pharyngeal- Nectar Cup -- Pharyngeal -- Pharyngeal- Nectar Straw -- Pharyngeal -- Pharyngeal- Thin Teaspoon -- Pharyngeal -- Pharyngeal- Thin Cup -- Pharyngeal -- Pharyngeal- Thin Straw -- Pharyngeal -- Pharyngeal- Puree -- Pharyngeal -- Pharyngeal- Mechanical Soft -- Pharyngeal -- Pharyngeal- Regular -- Pharyngeal -- Pharyngeal- Multi-consistency -- Pharyngeal -- Pharyngeal- Pill -- Pharyngeal -- Pharyngeal Comment --  CHL IP CERVICAL ESOPHAGEAL PHASE 08/01/2017 Cervical Esophageal Phase WFL Pudding Teaspoon -- Pudding Cup -- Honey Teaspoon -- Honey Cup -- Nectar Teaspoon -- Nectar Cup -- Nectar Straw -- Thin Teaspoon -- Thin Cup -- Thin Straw -- Puree -- Mechanical Soft -- Regular -- Multi-consistency -- Pill -- Cervical Esophageal Comment -- No flowsheet data found. [name] 08/01/2017, 2:39 PM  [name], M.A. CCC-SLP [phone]                 Assessment/Plan: Diagnosis: debility and encephalopathy related to ARDS and multiple medical issues 1. Does the need for close, 24 hr/day medical supervision in concert with the patient's rehab needs make it unreasonable for this patient to be served in a less intensive setting? Potentially 2. Co-Morbidities requiring supervision/potential complications: mgt of pulmonary and behavioral issues above/normalization of sleep-wake cycle 3. Due to bladder management, bowel management, safety, skin/wound care, disease management, medication administration, pain management and patient education, does the patient require 24 hr/day rehab nursing? Yes and Potentially 4. Does the patient require coordinated care of a physician, rehab nurse, PT (1-2 hrs/day, 5 days/week), OT (1-2 hrs/day, 5 days/week) and SLP (1-2 hrs/day, 5 days/week) to address physical and functional deficits in the context of the above medical diagnosis(es)? Yes and Potentially Addressing deficits in the following areas: balance, endurance, locomotion, strength, transferring, bowel/bladder control, bathing, dressing, feeding, grooming, toileting, cognition and psychosocial support 5. Can the patient actively participate in an intensive therapy program of at least 3 hrs of therapy per day at least 5 days per week? Potentially 6. The potential  for patient to make measurable gains while on inpatient rehab is good 7. Anticipated functional outcomes upon discharge from inpatient rehab are min assist  with PT, min assist with OT, supervision with SLP. 8. Estimated rehab length of stay to reach the above functional goals is: 18-24 days 9. Anticipated D/C setting: Home 10. Anticipated post D/C treatments: [place] therapy 11. Overall Rehab/Functional Prognosis: good  RECOMMENDATIONS: This patient's condition is appropriate for continued rehabilitative care in the following setting: eventually CIR when participation/cognition improve Patient  has agreed to participate in recommended program. N/A Note that insurance prior authorization may be required for reimbursement for recommended care.  Comment: spoke to family present in room. Need to re-establish sleep wake cycle at present. Rehab Admissions Coordinator to follow up.  Thanks,  Meredith Staggers, MD, Mellody Drown  I have personally performed a face to face diagnostic evaluation of this patient. Additionally, I have reviewed and concur with the physician assistant's documentation above.     Bary Leriche, PA-C 08/02/2017

## 2017-08-06 NOTE — Progress Notes (Signed)
  Speech Language Pathology Treatment: Dysphagia  Patient Details Name: [name] MRN: [id number] DOB: [birth year] Today's Date: 08/06/2017 Time: 1545-1600 SLP Time Calculation (min) (ACUTE ONLY): 15 min  Assessment / Plan / Recommendation Clinical Impression  Pt was seen for skilled ST targeting dysphagia goals.  Attempted to facilitate the session with a functional snack of dys 2 and dys 3 textures; however, due to pt's fleeting attention pt was unable to sustain her attention to self feeding despite max to total cues for redirection to task, environmental modifications, bargaining, or setting a limit to number of trials.  Friends were visiting from [employer] and did not appear to have great awareness of pt's cognitive deficits as they were noted to laugh and make jokes, not allow pt enough time to respond, and interjected with multiple distractions throughout treatment; however, family members appropriately excused themselves from the room as they reported noticing that pt is having difficulty attending during meals.  SLP discussed distraction management techniques to use at meal times to maximize pt safety with POs.  Pt is discharging to CIR today where it is recommended that she receive ongoing follow up for dysphagia as well as undergo more formal cognitive-linguistic assessment.    HPI HPI: Pt is a 72 year old female who was admitted to [facility] 5/7 with hypoxia and pleuritic chest pain. She had a decline in respiratory status 5/8 with brief PEA arrest f/b cardiogenic and septic shock requiring pressors. ETT 5/8-5/15. PMH includes CAD, CHF, CKD, DM, HTN, Stage IA right invasive ductal breast carcinoma, lupus. No prior swallow evaluations documented, although review of prior documentation suggests chronic difficulty swallowing pills.      SLP Plan  Continue with current plan of care       Recommendations  Diet recommendations: Dysphagia 2 (fine chop);Thin liquid Liquids provided via:  Cup;Straw Medication Administration: Crushed with puree Supervision: Staff to assist with self feeding;Full supervision/cueing for compensatory strategies Compensations: Minimize environmental distractions;Slow rate;Small sips/bites Postural Changes and/or Swallow Maneuvers: Seated upright 90 degrees                Oral Care Recommendations: Oral care BID Follow up Recommendations: Inpatient Rehab SLP Visit Diagnosis: Dysphagia, oral phase (R13.11) Plan: Continue with current plan of care       GO                PageSelinda Orion 08/06/2017, 4:07 PM

## 2017-08-06 NOTE — Clinical Social Work Note (Signed)
Patient will discharge to CIR today.  CSW signing off.  Dayton Scrape, [place]

## 2017-08-06 NOTE — Progress Notes (Signed)
Inpatient Rehabilitation Admissions Coordinator  I have insurance approval to admit pt to inpt rehab today. I met with patient and her sister at bedside. I contacted pt's daughter by phone and is in agreement to admit today. I contacted Dr. Darrick Meigs for d/c order and made RN CM and SW aware of plans for CIR today. I  Will make the arrangements to admit today.   Danne Baxter, RN, MSN Rehab Admissions Coordinator [phone] 08/06/2017 1:47 PM

## 2017-08-06 NOTE — PMR Pre-admission (Signed)
PMR Admission Coordinator Pre-Admission Assessment  Patient: [name] is an 72 y.o., female MRN: [id number] DOB: February 16, 1946 Height: 5' 4"  (162.6 cm) Weight: 81.7 kg (180 lb 1.9 oz)              Insurance Information HMO: yes    PPO:      PCP:      IPA:      80/20:      OTHER: Medicare advantage plan PRIMARY: [employer] Medicare      Policy#: [id number]      Subscriber: pt CM Name: Orvan July      Phone#: [phone]     Fax#: [phone] Pre-Cert#: [id number]    Approved for 7 days with f/u with Vevelyn Royals  Employer:  Benefits:  Phone #: [phone]     Name: 08/06/2017 Eff. Date: 03/19/2017     Deduct: none      Out of Pocket Max: $4400      Life Max: none CIR: $345 co pay per day days 1 until 5      SNF: no co pay days 1 to 20; $160 co pay per days days 21-48; no co pay days 49-100 Outpatient: $40 co pay per visit     Co-Pay: visits per medical neccesity Home Health: 100%      Co-Pay: visits per medical neccesity DME: 80%     Co-Pay: 20% Providers: in network  SECONDARYLurena Joiner      Policy#: [id number]      Subscriber: pt  Medicaid Application Date:       Case Manager:  Disability Application Date:       Case Worker:   Emergency Contact Information Contact Information    Name Relation Home Work Mobile   Fraser Daughter [phone]    Hill,Carmen Daughter [phone]     Hattie Perch Daughter   [phone]     Current Medical History  Patient Admitting Diagnosis: debility  History of Present Illness:  HPI: [name] is a 72 year old right-handed female with history of CAD maintained on aspirin and Plavix, breast cancer maintained on arimidex, diabetes mellitus, chronic systolic congestive heart failure, CKD stage II.    Presented 07/23/2016 with progressive cough, chest pain and hypoxia.  She was found to be septic with large left lung infiltration started on IV antibiotic coverage for CAP.  She had a brief PEA arrest post bronchoscopy  revealing alveolar hemorrhage.  Hospital course significant for progressive shortness of breath requiring intubation, cardiogenic shock requiring pressors, ARDS with acute blood loss anemia and progressive leukocytosis with fevers.  Infectious disease consulted for input and recommended 8-day course of Merrem to treat aspiration event.  Fevers did resolve tolerated extubation 07/31/2017.  She did have bouts of delirium and agitation and continued to improve.  Subcutaneous heparin for DVT prophylaxis.  Currently maintained on a dysphagia #2 thin liquid diet.  Physical and occupational therapy evaluations completed with recommendations of physical medicine rehab consult.   Past Medical History  Past Medical History:  Diagnosis Date  . Abdominal discomfort    Chronic N/V/D. Presumptive dx Crohn's dx per elevated p ANCA. Failed Entocort and Pentasa. Sep 2003 - ileocolectomy c anastomosis per Dr Deon Pilling 2/2 adhesions - path was hegative for Crohns. EGD, Sm bowel follow through (11/03), and an eteroclysis (10/03) were unrevealing. Cuases hypomag and hypocalcemia.  . Adnexal mass 8/03   s/p lap BSO (R ovarian fibroma) & lysis of adhesions  . Allergy    Seasonal  .  Anemia    Multifactorial. Baseline HgB 10-11 ish. B12 def - 150 in 3/10. Fe Def - ferritin 35 3/10. Both are being repleted.  . Breast cancer ([employer]) 03/16/13   right, 5 o'clock  . CAD (coronary artery disease) 1996   1996 - PTCA and angioplasty diagonal branch. 2000 - Rotoblator & angiopllasty of diagonal. 2006 - subendocardial AMI, DES to proximal LAD.Marland Kitchen Also had 90% stenosis in distal apical LAD. EF 55 with apical hypokinesis. Indefinite ASA and Plavix.  . CHF (congestive heart failure) ([place])   . Chronic kidney disease    Chronic renal insuff baseline Cr 1.2 - 1.4 ish.  . Chronic pain    CT 10/10 = Spinal stenosis L2 - S1.  . Diabetes mellitus    Insulin dependent  . Hx of radiation therapy 06/02/13- 07/16/13   right rbeast 4500 cGy 25  sessions, right breast boost 1600 cGy in 8 sessions  . Hyperlipidemia    Managed with both a statin and Welchol. Welchol stopped 2014 2/2 cost and started on fenofibrate   . Hypertension    2006 B renal arteries patent. 2003 MRA - no RAS. 2003 pheo W/U Dr Hassell Done reportedly negative.  . Hypoxia 07/23/2017  . Lupus ([place])   . Personal history of radiation therapy   . RBBB   . Stroke [facility])    Incidental finding MRI 2002 L lacunar infarct  . Wears dentures    top    Family History  family history includes Breast cancer in her maternal aunt and maternal grandmother; Breast cancer (age of onset: 27) in her mother; Cancer in her mother; Heart attack in her maternal grandmother; Hypertension in her daughter; Kidney failure in her maternal aunt; Lung cancer in her maternal aunt; Pancreatic cancer (age of onset: 65) in her brother; Prostate cancer in her maternal uncle.  Prior Rehab/Hospitalizations:  Has the patient had major surgery during 100 days prior to admission? No  Current Medications   Current Facility-Administered Medications:  .  acetaminophen (TYLENOL) tablet 650 mg, 650 mg, Oral, Q6H PRN, 650 mg at 08/04/17 2236 **OR** acetaminophen (TYLENOL) suppository 650 mg, 650 mg, Rectal, Q6H PRN, Whiteheart, Kathryn A, NP .  albuterol (PROVENTIL) (2.5 MG/3ML) 0.083% nebulizer solution 2.5 mg, 2.5 mg, Nebulization, Q4H PRN, Whiteheart, Kathryn A, NP .  amLODipine (NORVASC) tablet 10 mg, 10 mg, Oral, Daily, Whiteheart, Kathryn A, NP, 10 mg at 08/06/17 0954 .  anastrozole (ARIMIDEX) tablet 1 mg, 1 mg, Oral, Daily, Darrick Meigs, Marge Duncans, MD, 1 mg at 08/06/17 0954 .  aspirin EC tablet 81 mg, 81 mg, Oral, Daily, Oswald Hillock, MD, 81 mg at 08/06/17 0955 .  carvedilol (COREG) tablet 12.5 mg, 12.5 mg, Oral, BID WC, Georgette Shell, MD, 12.5 mg at 08/06/17 0800 .  clopidogrel (PLAVIX) tablet 75 mg, 75 mg, Oral, Daily, Oswald Hillock, MD, 75 mg at 08/06/17 0954 .  famotidine (PEPCID) 40 MG/5ML suspension 20  mg, 20 mg, Oral, Daily, Rober Minion F, RPH, 20 mg at 08/06/17 1000 .  furosemide (LASIX) tablet 40 mg, 40 mg, Oral, Daily, Darrick Meigs, Marge Duncans, MD .  heparin injection 5,000 Units, 5,000 Units, Subcutaneous, Q8H, Whiteheart, Kathryn A, NP, 5,000 Units at 08/06/17 0449 .  hydrALAZINE (APRESOLINE) injection 10 mg, 10 mg, Intravenous, Q6H PRN, Oswald Hillock, MD, 10 mg at 08/06/17 0449 .  hydrOXYzine (ATARAX/VISTARIL) tablet 10 mg, 10 mg, Oral, QHS, Georgette Shell, MD, 10 mg at 08/05/17 2206 .  insulin aspart (novoLOG) injection 0-20 Units,  0-20 Units, Subcutaneous, TID WC, Oswald Hillock, MD, 4 Units at 08/06/17 1145 .  insulin glargine (LANTUS) injection 15 Units, 15 Units, Subcutaneous, Daily, Tarry Kos, MD, 15 Units at 08/06/17 0955 .  loperamide (IMODIUM) capsule 2 mg, 2 mg, Oral, PRN, Oswald Hillock, MD, 2 mg at 08/05/17 1044 .  losartan (COZAAR) tablet 100 mg, 100 mg, Oral, Daily, Georgette Shell, MD, 100 mg at 08/06/17 0954 .  MEDLINE mouth rinse, 15 mL, Mouth Rinse, BID, Whiteheart, Kathryn A, NP, 15 mL at 08/06/17 0959 .  phenol (CHLORASEPTIC) mouth spray 1 spray, 1 spray, Mouth/Throat, PRN, Whiteheart, Kathryn A, NP .  protein supplement (PREMIER PROTEIN) liquid, 11 oz, Oral, BID BM, Axel Filler, MD, 11 oz at 08/06/17 1000 .  QUEtiapine (SEROQUEL) tablet 50 mg, 50 mg, Oral, QHS, Oswald Hillock, MD, 50 mg at 08/05/17 2206 .  sodium chloride flush (NS) 0.9 % injection 10-40 mL, 10-40 mL, Intracatheter, PRN, Thereasa Solo Kimberlee Nearing, MD  Facility-Administered Medications Ordered in Other Encounters:  .  0.9 %  sodium chloride infusion, , Intravenous, Continuous, Burgess Estelle, MD, Last Rate: 150 mL/hr at 10/03/15 1607  Patients Current Diet:  Diet Order           Diet - low sodium heart healthy        DIET DYS 2 Room service appropriate? 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 None of the above  Prior Functional Level Prior Function Level of Independence: Independent Comments: preschool teacher; drove  Self Care: Did the patient need help bathing, dressing, using the toilet or eating?  Independent  Indoor Mobility: Did the patient need assistance with walking from room to room (with or without device)? Independent  Stairs: Did the patient need assistance with internal or external stairs (with or without device)? Independent  Functional Cognition: Did the patient need help planning regular tasks such as shopping or remembering to take medications? Independent  Current Functional Level Cognition  Overall Cognitive Status: Impaired/Different from baseline Difficult to assess due to: Intubated Current Attention Level: Focused Orientation Level: Oriented to person, Oriented to place, Disoriented to time, Disoriented to situation Following Commands: Follows one step commands with increased time Safety/Judgement: Decreased awareness of safety, Decreased awareness of deficits General Comments: she did crack a couple of jokes today with a belly laugh.    Extremity Assessment (includes Sensation/Coordination)  Upper Extremity Assessment: Generalized weakness, LUE deficits/detail, RUE deficits/detail RUE Deficits / Details: shoulder grossly 2/5; elbow/wrist/hand 3/5 RUE Coordination: decreased fine motor, decreased gross motor LUE  Deficits / Details: shoulder 2/5; elbow 3/5 hand 2/5 - edematous hand LUE Coordination: decreased fine motor, decreased gross motor  Lower Extremity Assessment: Defer to PT evaluation RLE Deficits / Details: PROM WFL, strength grossly 2/5 LLE Deficits / Details: PROM WFL, strength grossly 2/5    ADLs  Overall ADL's : Needs assistance/impaired Eating/Feeding: Maximal assistance, Sitting Eating/Feeding Details (  indicate cue type and reason): can bring cup with straw to mouth, attempted with spoon Grooming: Wash/dry face, Maximal assistance(blew her nose independently) Grooming Details (indicate cue type and reason): able to complete oral care with oral suction using R hand Upper Body Bathing: Maximal assistance, Sitting Lower Body Bathing: Maximal assistance, Bed level Upper Body Dressing : Total assistance, Sitting Lower Body Dressing: Total assistance, Sitting/lateral leans Toileting- Clothing Manipulation and Hygiene: Total assistance, Bed level Toileting - Clothing Manipulation Details (indicate cue type and reason): pt on bed pan upon arrival Functional mobility during ADLs: Maximal assistance, +2 for physical assistance General ADL Comments: educated in elevating L UE on 2 pillows    Mobility  Overal bed mobility: Needs Assistance Bed Mobility: Sit to Supine Rolling: Max assist Sidelying to sit: Max assist Supine to sit: Max assist, +2 for physical assistance Sit to supine: Max assist General bed mobility comments: max assist to lift trunk    Transfers  Overall transfer level: Needs assistance Equipment used: Rolling walker (2 wheeled) Transfers: Sit to/from Stand, [employer] Transfers Sit to Stand: Mod assist, Min assist Stand pivot transfers: Mod assist General transfer comment: sit-to-stand transfers x5 with progressively more assistance as fatigue from min to mod A; verbal cues for placement of hands and sequencing of steps.    Ambulation / Gait / Stairs / Wheelchair  Mobility  Ambulation/Gait Ambulation/Gait assistance: Mod assist Ambulation Distance (Feet): 5 Feet Assistive device: Rolling walker (2 wheeled) Gait Pattern/deviations: Step-to pattern, Decreased stride length, Decreased step length - right, Decreased step length - left, Decreased stance time - right, Decreased stance time - left, Decreased weight shift to right, Decreased weight shift to left, Shuffle, Wide base of support, Trunk flexed General Gait Details: markedly decreased; fatigues quickly    Posture / Balance Dynamic Sitting Balance Sitting balance - Comments: no support x 15 minutes while pt ate and granddaughter combed her hair Balance Overall balance assessment: Needs assistance Sitting-balance support: Bilateral upper extremity supported, Feet supported Sitting balance-Leahy Scale: Fair Sitting balance - Comments: no support x 15 minutes while pt ate and granddaughter combed her hair Standing balance support: Bilateral upper extremity supported, During functional activity Standing balance-Leahy Scale: Poor    Special needs/care consideration BiPAP/CPAP  N/a CPM  N/a Continuous Drip IV  N/a Dialysis  N/a Life Vest n/a Oxygen  N/a Special Bed n/a Trach Size n/a Wound Vac n/a Skin intact Bowel mgmt: incontinent  Bladder mgmt:external catheter Diabetic mgmt yes   Previous Home Environment Living Arrangements: (lives with a roommate; unsure of who this was)  Lives With: Friend(s) Available Help at Discharge: Family, Available 24 hours/day Type of Home: Apartment Home Layout: One level Home Access: Stairs to enter Technical brewer of Steps: 1 Bathroom Shower/Tub: Public librarian, Architectural technologist: Programmer, systems: Yes Home Care Services: No  Discharge Living Setting Plans for Discharge Living Setting: Patient's home, Lives with (comment)(family to take shifts to povide 24/7 assist) Type of Home at Discharge: Apartment Discharge Home  Layout: One level Discharge Home Access: Stairs to enter Entrance Stairs-Rails: None Entrance Stairs-Number of Steps: 1 Discharge Bathroom Shower/Tub: Tub/shower unit, Curtain Discharge Bathroom Toilet: Standard Discharge Bathroom Accessibility: Yes How Accessible: Accessible via walker Does the patient have any problems obtaining your medications?: No  Social/Family/Support Systems Patient Roles: Parent, 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) Contact Information: Hattie Perch, daughter who is NP at [employer] Anticipated Caregiver: daugher and family are taking shifts and will hire assist from [place]  at d/c as needed to fill in the gaps Anticipated Caregiver's Contact Information: [phone] Ability/Limitations of Caregiver: family takes shifts and will hire assist Caregiver Availability: 24/7 Discharge Plan Discussed with Primary Caregiver: Yes Is Caregiver In Agreement with Plan?: Yes Does Caregiver/Family have Issues with Lodging/Transportation while Pt is in Rehab?: No(family stays with her 24/7 in hospital)  Goals/Additional Needs Patient/Family Goal for Rehab: min assist with PT, OT and SLP Expected length of stay: ELOS 18-24 days Pt/Family Agrees to Admission and willing to participate: Yes Program Orientation Provided & Reviewed with Pt/Caregiver Including Roles  & Responsibilities: Yes  Decrease burden of Care through IP rehab admission: n/a  Possible need for SNF placement upon discharge:not anticipated  Patient Condition: This patient's medical and functional status has changed since the consult dated: 08/02/2017 in which the Rehabilitation Physician determined and documented that the patient's condition is appropriate for intensive rehabilitative care in an inpatient rehabilitation facility. See "History of Present Illness" (above) for medical update. Functional changes are: mod assist. Patient's medical and functional status update has been discussed  with the Rehabilitation physician and patient remains appropriate for inpatient rehabilitation. Will admit to inpatient rehab today.  Preadmission Screen Completed By:  Cleatrice Burke, 08/06/2017 3:23 PM ______________________________________________________________________   Discussed status with Dr. Naaman Plummer on 08/06/2017 at  1529  and received telephone approval for admission today.  Admission Coordinator:  Cleatrice Burke, time 0228 Date 08/06/2017

## 2017-08-06 NOTE — Care Management Important Message (Signed)
Important Message  Patient Details  Name: [name] MRN: [id number] Date of Birth: [birth year]   Medicare Important Message Given:  Yes    Barb Merino [place] 08/06/2017, 3:12 PM

## 2017-08-07 ENCOUNTER — Inpatient Hospital Stay (HOSPITAL_COMMUNITY): Payer: Medicare Other | Admitting: Physical Therapy

## 2017-08-07 ENCOUNTER — Inpatient Hospital Stay (HOSPITAL_COMMUNITY): Payer: Medicare Other | Admitting: Speech Pathology

## 2017-08-07 ENCOUNTER — Inpatient Hospital Stay (HOSPITAL_COMMUNITY): Payer: Medicare Other | Admitting: Occupational Therapy

## 2017-08-07 LAB — CBC WITH DIFFERENTIAL/PLATELET
Abs Immature Granulocytes: 0.1 10*3/uL (ref 0.0–0.1)
Basophils Absolute: 0 10*3/uL (ref 0.0–0.1)
Basophils Relative: 0 %
EOS ABS: 0.1 10*3/uL (ref 0.0–0.7)
Eosinophils Relative: 1 %
HEMATOCRIT: 28.1 % — AB (ref 36.0–46.0)
Hemoglobin: 8.8 g/dL — ABNORMAL LOW (ref 12.0–15.0)
Immature Granulocytes: 1 %
LYMPHS ABS: 1.7 10*3/uL (ref 0.7–4.0)
Lymphocytes Relative: 12 %
MCH: 25.4 pg — AB (ref 26.0–34.0)
MCHC: 31.3 g/dL (ref 30.0–36.0)
MCV: 81.2 fL (ref 78.0–100.0)
MONO ABS: 0.9 10*3/uL (ref 0.1–1.0)
MONOS PCT: 7 %
Neutro Abs: 10.9 10*3/uL — ABNORMAL HIGH (ref 1.7–7.7)
Neutrophils Relative %: 79 %
Platelets: 278 10*3/uL (ref 150–400)
RBC: 3.46 MIL/uL — ABNORMAL LOW (ref 3.87–5.11)
RDW: 17.3 % — AB (ref 11.5–15.5)
WBC: 13.7 10*3/uL — ABNORMAL HIGH (ref 4.0–10.5)

## 2017-08-07 LAB — COMPREHENSIVE METABOLIC PANEL
ALT: 29 U/L (ref 14–54)
AST: 21 U/L (ref 15–41)
Albumin: 2.3 g/dL — ABNORMAL LOW (ref 3.5–5.0)
Alkaline Phosphatase: 98 U/L (ref 38–126)
Anion gap: 9 (ref 5–15)
BILIRUBIN TOTAL: 0.7 mg/dL (ref 0.3–1.2)
BUN: 28 mg/dL — AB (ref 6–20)
CO2: 20 mmol/L — ABNORMAL LOW (ref 22–32)
Calcium: 9.2 mg/dL (ref 8.9–10.3)
Chloride: 115 mmol/L — ABNORMAL HIGH (ref 101–111)
Creatinine, Ser: 1.14 mg/dL — ABNORMAL HIGH (ref 0.44–1.00)
GFR, EST AFRICAN AMERICAN: 55 mL/min — AB (ref >60)
GFR, EST NON AFRICAN AMERICAN: 48 mL/min — AB (ref >60)
Glucose, Bld: 133 mg/dL — ABNORMAL HIGH (ref 65–99)
POTASSIUM: 3.6 mmol/L (ref 3.5–5.1)
Sodium: 144 mmol/L (ref 135–145)
TOTAL PROTEIN: 7.1 g/dL (ref 6.5–8.1)

## 2017-08-07 LAB — GLUCOSE, CAPILLARY
GLUCOSE-CAPILLARY: 124 mg/dL — AB (ref 65–99)
GLUCOSE-CAPILLARY: 173 mg/dL — AB (ref 65–99)
Glucose-Capillary: 146 mg/dL — ABNORMAL HIGH (ref 65–99)
Glucose-Capillary: 155 mg/dL — ABNORMAL HIGH (ref 65–99)

## 2017-08-07 MED ORDER — SODIUM CHLORIDE 0.9% FLUSH
10.0000 mL | INTRAVENOUS | Status: DC | PRN
  Administered 2017-08-07: 30 mL
  Administered 2017-08-15: 20 mL
  Administered 2017-08-16: 30 mL
  Administered 2017-08-19 – 2017-08-21 (×3): 10 mL
  Filled 2017-08-07 (×6): qty 40

## 2017-08-07 NOTE — Evaluation (Signed)
Occupational Therapy Assessment and Plan  Patient Details  Name: [name] MRN: [id number] Date of Birth: [birth year]  OT Diagnosis: abnormal posture, cognitive deficits, muscle weakness (generalized) and coordination disorder Rehab Potential: Rehab Potential (ACUTE ONLY): Fair ELOS: 21-24 days   Today's Date: 08/07/2017 OT Individual Time: 4008-6761 OT Individual Time Calculation (min): 59 min     Problem List:  Patient Active Problem List   Diagnosis Date Noted  . ARDS (adult respiratory distress syndrome) ([place]) 08/06/2017  . Debility   . Encephalopathy   . Pressure injury of skin 08/04/2017  . Pneumonia of left lung due to infectious organism   . Acute respiratory failure with hypoxemia ([place])   . Cardiac arrest, cause unspecified ([place])   . Shock circulatory ([place])   . Septic shock ([place])   . HCAP (healthcare-associated pneumonia)   . Diffuse pulmonary alveolar hemorrhage   . Chest pain 07/23/2017  . Hypoxia 07/23/2017  . SOB (shortness of breath) 10/11/2016  . Hyperparathyroidism, secondary renal ([place]) 05/02/2016  . Gout 04/11/2015  . Diabetic neuropathy associated with type 2 diabetes mellitus ([place]) 01/06/2015  . Breast cancer of lower-inner quadrant of right female breast ([place]) 03/18/2013  . Chronic venous insufficiency 01/06/2013  . Health care maintenance 05/08/2011  . Diabetes mellitus type 2 in obese ([place]) 05/03/2010  . Seasonal allergies 05/03/2010  . Labile hypertension   . CAD (coronary artery disease)   . Abdominal discomfort   . Anemia in chronic kidney disease   . History of stroke without residual deficits   . Hyperlipidemia associated with type 2 diabetes mellitus ([place])   . Chronic pain   . Chronic kidney disease, stage III (moderate) ([facility])   . Adnexal mass     Past Medical History:  Past Medical History:  Diagnosis Date  . Abdominal discomfort    Chronic N/V/D. Presumptive dx Crohn's dx per elevated p ANCA. Failed Entocort and Pentasa. Sep 2003 -  ileocolectomy c anastomosis per Dr Deon Pilling 2/2 adhesions - path was hegative for Crohns. EGD, Sm bowel follow through (11/03), and an eteroclysis (10/03) were unrevealing. Cuases hypomag and hypocalcemia.  . Adnexal mass 8/03   s/p lap BSO (R ovarian fibroma) & lysis of adhesions  . Allergy    Seasonal  . Anemia    Multifactorial. Baseline HgB 10-11 ish. B12 def - 150 in 3/10. Fe Def - ferritin 35 3/10. Both are being repleted.  . Breast cancer ([place]) 03/16/13   right, 5 o'clock  . CAD (coronary artery disease) 1996   1996 - PTCA and angioplasty diagonal branch. 2000 - Rotoblator & angiopllasty of diagonal. 2006 - subendocardial AMI, DES to proximal LAD.Marland Kitchen Also had 90% stenosis in distal apical LAD. EF 55 with apical hypokinesis. Indefinite ASA and Plavix.  . CHF (congestive heart failure) ([place])   . Chronic kidney disease    Chronic renal insuff baseline Cr 1.2 - 1.4 ish.  . Chronic pain    CT 10/10 = Spinal stenosis L2 - S1.  . Diabetes mellitus    Insulin dependent  . Hx of radiation therapy 06/02/13- 07/16/13   right rbeast 4500 cGy 25 sessions, right breast boost 1600 cGy in 8 sessions  . Hyperlipidemia    Managed with both a statin and Welchol. Welchol stopped 2014 2/2 cost and started on fenofibrate   . Hypertension    2006 B renal arteries patent. 2003 MRA - no RAS. 2003 pheo W/U Dr Hassell Done reportedly negative.  . Hypoxia 07/23/2017  . Lupus ([place])   .  Personal history of radiation therapy   . RBBB   . Stroke [facility])    Incidental finding MRI 2002 L lacunar infarct  . Wears dentures    top   Past Surgical History:  Past Surgical History:  Procedure Laterality Date  . ABDOMINAL HYSTERECTOMY    . BILATERAL SALPINGOOPHORECTOMY  8/03   Lap BSO (R ovarian fibroma) and adhesion lysis  . BOWEL RESECTION  2003   ileocolectomy with anastomosis 2/2 adhesions  . BREAST BIOPSY    . BREAST LUMPECTOMY Right   . BREAST LUMPECTOMY WITH NEEDLE LOCALIZATION AND AXILLARY SENTINEL LYMPH NODE BX  Right 04/22/2013   Procedure: BREAST LUMPECTOMY WITH NEEDLE LOCALIZATION AND AXILLARY SENTINEL LYMPH NODE BX;  Surgeon: Stark Klein, MD;  Location: [facility];  Service: General;  Laterality: Right;  . CARDIAC CATHETERIZATION     2 stents  . CHOLECYSTECTOMY    . HEMICOLECTOMY     R sided hemicolectomy  . HERNIA REPAIR     Ventral hernia repair  . PTCA  4/06    Assessment & Plan Clinical Impression: Patient is a 72 y.o. year old female with history of CAD maintained on aspirin and Plavix, breast cancer maintained on arimidex, diabetes mellitus, chronic systolic congestive heart failure, CKD stage II. Per chart review patient lives with her daughter. Independent prior to admission. Presented 07/23/2016 with progressive cough, chest pain and hypoxia. She was found to be septic with large left lung infiltration started on IV antibiotic coverage for CAP. She had a brief PEA arrest post bronchoscopy revealing alveolar hemorrhage. Hospital course significant for progressive shortness of breath requiring intubation, cardiogenic shock requiring pressors, ARDS with acute blood loss anemia and progressive leukocytosis with fevers. Infectious disease consulted for input and recommended 8-day course of Merrem to treat aspiration event. Fevers did resolve tolerated extubation 07/31/2017. She did have bouts of delirium and agitation and continued to improve. Subcutaneous heparin for DVT prophylaxis. Currently maintained on a dysphagia #2 thin liquid diet. Physical and occupational therapy evaluations completed with recommendations of physical medicine rehab consult. Patient was admitted for a comprehensive rehab program. Patient transferred to CIR on 08/06/2017 .    Patient currently requires total with basic self-care skills secondary to muscle weakness, decreased cardiorespiratoy endurance, decreased coordination, decreased initiation, decreased attention, decreased awareness, decreased problem solving,  decreased safety awareness, decreased memory and delayed processing and decreased sitting balance, decreased standing balance and decreased balance strategies.  Prior to hospitalization, patient could complete ADLs with independent .  Patient will benefit from skilled intervention to decrease level of assist with basic self-care skills prior to discharge home with care partner.  Anticipate patient will require 24 hour supervision and minimal physical assistance and follow up home health.  OT - End of Session Activity Tolerance: Decreased this session Endurance Deficit: Yes Endurance Deficit Description: needs frequent rest breaks OT Assessment Rehab Potential (ACUTE ONLY): Fair OT Barriers to Discharge: Decreased caregiver support OT Patient demonstrates impairments in the following area(s): Balance;Safety;Cognition;Skin Integrity;Vision;Edema;Endurance;Motor;Pain OT Basic ADL's Functional Problem(s): Grooming;Bathing;Dressing;Toileting OT Transfers Functional Problem(s): Toilet;Tub/Shower OT Additional Impairment(s): None OT Plan OT Intensity: Minimum of 1-2 x/day, 45 to 90 minutes OT Frequency: 5 out of 7 days OT Duration/Estimated Length of Stay: 21-24 days OT Treatment/Interventions: Balance/vestibular training;Discharge planning;Pain management;Self Care/advanced ADL retraining;Therapeutic Activities;UE/LE Coordination activities;Cognitive remediation/compensation;Functional mobility training;Patient/family education;Therapeutic Exercise;Visual/perceptual remediation/compensation;Community reintegration;DME/adaptive equipment instruction;Psychosocial support;UE/LE Strength taining/ROM;Wheelchair propulsion/positioning OT Self Feeding Anticipated Outcome(s): Supervision OT Basic Self-Care Anticipated Outcome(s): Supervision - min A OT Toileting Anticipated Outcome(s):  min A OT Bathroom Transfers Anticipated Outcome(s): min A OT Recommendation Recommendations for Other Services: Other  (comment)(none at this time) Patient destination: Home Follow Up Recommendations: Home health OT;24 hour supervision/assistance Equipment Recommended: To be determined   Skilled Therapeutic Intervention Upon entering the room, pt supine in bed with granddaughter present in room. OT educated on OT purpose, POC, and goals. Pt required encouragement for OT intervention this session. Supine >sit with max A to EOB. Multiple attempts made to stand with pt able to come to full stand with therapist and max multimodal cues with increased time. Pt unable to initiate and sequence step to transfer into wheelchair from standing position. Squat pivot transfer from bed >wheelchair with total A. Pt engaged in grooming tasks from seated position in wheelchair with min A and max multimodal cues for initiation and sequence of task. Pt remained in wheelchair at end of session with another family member entering the room. Quick release belt donned for safety. Call bell and all needed items within reach upon exiting the room.   OT Evaluation Precautions/Restrictions  Precautions Precautions: Fall Precaution Comments: fearful of falling Restrictions Weight Bearing Restrictions: No Vital Signs Oxygen Therapy SpO2: 97 % O2 Device: Room Air Pain Pain Assessment Pain Scale: Faces Faces Pain Scale: No hurt Home Living/Prior Functioning Home Living Family/patient expects to be discharged to:: Private residence Living Arrangements: Other (Comment)(roommate) Available Help at Discharge: Family, Available 24 hours/day Type of Home: Apartment Home Access: Stairs to enter [employer] of Steps: 1 Home Layout: One level Bathroom Shower/Tub: Tub/shower unit, Air cabin crew Accessibility: Yes Additional Comments: PLOF obtained from chart, pt unable to provide  Lives With: Family Prior Function Level of Independence: Independent with gait, Independent with transfers  Able to  Take Stairs?: Yes Driving: Yes Vocation: Full time employment 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 Leisure: Hobbies-yes (Comment) Comments: very involved in church Vision Baseline Vision/History: No visual deficits Patient Visual Report: No change from baseline Vision Assessment?: Vision impaired- to be further tested in functional context Additional Comments: able to read name badge but decreased visual attention Praxis Praxis: Impaired Praxis Impairment Details: Ideation;Initiation;Ideomotor;Motor planning Cognition Overall Cognitive Status: Impaired/Different from baseline Arousal/Alertness: Awake/alert Memory: Impaired Attention: Focused Focused Attention: Impaired Awareness: Impaired Awareness Impairment: Intellectual impairment;Emergent impairment;Anticipatory impairment Problem Solving: Impaired Safety/Judgment: Impaired Sensation Sensation Light Touch: Impaired by gross assessment Additional Comments: L UE with increased edema secondary to blood transfusion Iv Coordination Gross Motor Movements are Fluid and Coordinated: No Fine Motor Movements are Fluid and Coordinated: No Coordination and Movement Description: limited by weakness  Mobility  Transfers Transfers: Sit to Stand;Stand to Sit Sit to Stand: 2: Max assist Stand to Sit: 2: Max assist  Trunk/Postural Assessment  Cervical Assessment Cervical Assessment: Exceptions to WFL(forward head) Thoracic Assessment Thoracic Assessment: Exceptions to WFL(rounded shoulders) Lumbar Assessment Lumbar Assessment: Within Functional Limits Postural Control Postural Control: Deficits on evaluation  Balance Balance Balance Assessed: Yes Static Sitting Balance Static Sitting - Balance Support: Bilateral upper extremity supported;Feet supported Static Sitting - Level of Assistance: 5: Stand by assistance Dynamic Sitting Balance Dynamic Sitting - Balance Support: Left upper extremity supported;Feet  supported Dynamic Sitting - Level of Assistance: 4: Min assist Extremity/Trunk Assessment RUE Assessment RUE Assessment: Exceptions to WFL(3-/5 throughout) LUE Assessment LUE Assessment: Exceptions to WFL(2-/5, edema, painful)   See Function Navigator for Current Functional Status.   Refer to Care Plan for Long Term Goals  Recommendations for other services: None    Discharge Criteria: Patient  will be discharged from OT if patient refuses treatment 3 consecutive times without medical reason, if treatment goals not met, if there is a change in medical status, if patient makes no progress towards goals or if patient is discharged from hospital.  The above assessment, treatment plan, treatment alternatives and goals were discussed and mutually agreed upon: by patient and by family  [name] 08/07/2017, 2:47 PM

## 2017-08-07 NOTE — Progress Notes (Signed)
[facility] PHYSICAL MEDICINE & REHABILITATION     PROGRESS NOTE    Subjective/Complaints: Slept a few hours last night. Better than previous nights. Up with OT this morning  ROS: Patient denies fever, rash, sore throat, blurred vision, nausea, vomiting, diarrhea, cough, shortness of breath or chest pain, joint or back pain, headache, or mood change.   Objective:  No results found. Recent Labs    08/06/17 1856 08/07/17 0609  WBC 14.4* 13.7*  HGB 9.4* 8.8*  HCT 30.1* 28.1*  PLT 286 278   Recent Labs    08/06/17 0440 08/06/17 1856 08/07/17 0609  NA 142  --  144  K 3.9  --  3.6  CL 115*  --  115*  GLUCOSE 118*  --  133*  BUN 27*  --  28*  CREATININE 1.03* 1.07* 1.14*  CALCIUM 9.0  --  9.2   CBG (last 3)  Recent Labs    08/06/17 1627 08/06/17 2132 08/07/17 0638  GLUCAP 189* 119* 124*    Wt Readings from Last 3 Encounters:  08/07/17 80 kg (176 lb 5.9 oz)  08/06/17 81.7 kg (180 lb 1.9 oz)  06/19/17 87.1 kg (192 lb 1.6 oz)     Intake/Output Summary (Last 24 hours) at 08/07/2017 0914 Last data filed at 08/07/2017 0806 Gross per 24 hour  Intake 30 ml  Output 400 ml  Net -370 ml    Vital Signs: Blood pressure 130/60, pulse 92, temperature 100.1 F (37.8 C), temperature source Oral, resp. rate 16, height 5' 4"  (1.626 m), weight 80 kg (176 lb 5.9 oz), SpO2 98 %. Physical Exam:  Constitutional: No distress . Vital signs reviewed. HEENT: EOMI, oral membranes moist Neck: supple Cardiovascular: RRR without murmur. No JVD    Respiratory: CTA Bilaterally without wheezes or rales. Normal effort    GI: BS +, non-tender, non-distended   Musculoskeletal: She exhibits no edema.  Neurological:  Mood is flat . Follows simple commands. She can provide her name and age but not much else. Processing delays.  UE movement  bilaterlly 3-4/5. LE: 1-2/5HF, 2/5 KE and 3/5 ADF/PF----inconsistent due to effort  Psychiatric:  Flat, but initiates more  today    Assessment/Plan: 1. Functional deficits secondary to debility/encephalopathy which require 3+ hours per day of interdisciplinary therapy in a comprehensive inpatient rehab setting. Physiatrist is providing close team supervision and 24 hour management of active medical problems listed below. Physiatrist and rehab team continue to assess barriers to discharge/monitor patient progress toward functional and medical goals.  Function:  Bathing Bathing position      Bathing parts      Bathing assist        Upper Body Dressing/Undressing Upper body dressing                    Upper body assist        Lower Body Dressing/Undressing Lower body dressing                                  Lower body assist        Toileting Toileting          Toileting assist     Transfers Chair/bed Clinical biochemist          Cognition Comprehension  Expression    Social Interaction    Problem Solving    Memory     Medical Problem List and Plan:  1. Debility and encephalopathy secondary to ARDS/cardiogenic shock/PEA arrest /CAP/multi-medical-extubated 07/31/2017  -continue therapies  2. DVT Prophylaxis/Anticoagulation: Subcutaneous heparin. Monitor for any bleeding episodes  3. Pain Management: Tylenol as needed  4. Mood/delirium/insomnia: Seroquel   64m QHS at 8:30pm  -?continue Atarax 10 mg nightly----may be actually increasing confusion  -  sleep-wake  chart  5. Neuropsych: This patient is not capable of making decisions on her own behalf.  6. Skin/Wound Care: Routine skin checks  7. Fluids/Electrolytes/Nutrition: encourage PO   I personally reviewed the patient's labs today.    -BUN/Cr actually improved 28/1.14 8. CAP/fevers. 8-day course of Merrem completed, afebrile at present  9. Dysphagia. Dysphagia #2 thin liquids. Follow-up speech therapy  -encourage PO. Doesn't like d2 diet  10.  Chronic systolic congestive heart failure. Monitor for any signs of fluid overload  11. History of CAD. Continue aspirin and Plavix  12. CKD stage II. Follow-up chemistries stable to improved 13. Hypertension. Norvasc 10 mg daily, Coreg 12.5 mg twice daily, Cozaar 100 mg daily.  -blood pressures with improvement overnight into this morning.  -no changes at present. Follow for consistent pattern 14. Diabetes mellitus with peripheral neuropathy. Lantus insulin 15 units nightly.   -fair control at present 15. History of breast cancer. Continue arimidex    LOS (Days) 1 A FACE TO FACE EVALUATION WAS PERFORMED  ZMeredith Staggers MD 08/07/2017 9:14 AM

## 2017-08-07 NOTE — Evaluation (Signed)
Speech Language Pathology Assessment and Plan  Patient Details  Name: [name] MRN: [id number] Date of Birth: 05-11-1945  SLP Diagnosis: Cognitive Impairments;Dysphagia  Rehab Potential: Fair ELOS: 3 weeks     Today's Date: 08/07/2017 SLP Individual Time: 0626-9485 SLP Individual Time Calculation (min): 55 min   Problem List:  Patient Active Problem List   Diagnosis Date Noted  . ARDS (adult respiratory distress syndrome) ([place]) 08/06/2017  . Debility   . Encephalopathy   . Pressure injury of skin 08/04/2017  . Pneumonia of left lung due to infectious organism   . Acute respiratory failure with hypoxemia ([place])   . Cardiac arrest, cause unspecified ([place])   . Shock circulatory ([place])   . Septic shock ([place])   . HCAP (healthcare-associated pneumonia)   . Diffuse pulmonary alveolar hemorrhage   . Chest pain 07/23/2017  . Hypoxia 07/23/2017  . SOB (shortness of breath) 10/11/2016  . Hyperparathyroidism, secondary renal ([place]) 05/02/2016  . Gout 04/11/2015  . Diabetic neuropathy associated with type 2 diabetes mellitus ([place]) 01/06/2015  . Breast cancer of lower-inner quadrant of right female breast ([place]) 03/18/2013  . Chronic venous insufficiency 01/06/2013  . Health care maintenance 05/08/2011  . Diabetes mellitus type 2 in obese ([place]) 05/03/2010  . Seasonal allergies 05/03/2010  . Labile hypertension   . CAD (coronary artery disease)   . Abdominal discomfort   . Anemia in chronic kidney disease   . History of stroke without residual deficits   . Hyperlipidemia associated with type 2 diabetes mellitus ([place])   . Chronic pain   . Chronic kidney disease, stage III (moderate) (H[facility])   . Adnexal mass    Past Medical History:  Past Medical History:  Diagnosis Date  . Abdominal discomfort    Chronic N/V/D. Presumptive dx Crohn's dx per elevated p ANCA. Failed Entocort and Pentasa. Sep 2003 - ileocolectomy c anastomosis per Dr Deon Pilling 2/2 adhesions - path was hegative for  Crohns. EGD, Sm bowel follow through (11/03), and an eteroclysis (10/03) were unrevealing. Cuases hypomag and hypocalcemia.  . Adnexal mass 8/03   s/p lap BSO (R ovarian fibroma) & lysis of adhesions  . Allergy    Seasonal  . Anemia    Multifactorial. Baseline HgB 10-11 ish. B12 def - 150 in 3/10. Fe Def - ferritin 35 3/10. Both are being repleted.  . Breast cancer ([place]) 03/16/13   right, 5 o'clock  . CAD (coronary artery disease) 1996   1996 - PTCA and angioplasty diagonal branch. 2000 - Rotoblator & angiopllasty of diagonal. 2006 - subendocardial AMI, DES to proximal LAD.Marland Kitchen Also had 90% stenosis in distal apical LAD. EF 55 with apical hypokinesis. Indefinite ASA and Plavix.  . CHF (congestive heart failure) ([place])   . Chronic kidney disease    Chronic renal insuff baseline Cr 1.2 - 1.4 ish.  . Chronic pain    CT 10/10 = Spinal stenosis L2 - S1.  . Diabetes mellitus    Insulin dependent  . Hx of radiation therapy 06/02/13- 07/16/13   right rbeast 4500 cGy 25 sessions, right breast boost 1600 cGy in 8 sessions  . Hyperlipidemia    Managed with both a statin and Welchol. Welchol stopped 2014 2/2 cost and started on fenofibrate   . Hypertension    2006 B renal arteries patent. 2003 MRA - no RAS. 2003 pheo W/U Dr Hassell Done reportedly negative.  . Hypoxia 07/23/2017  . Lupus ([place])   . Personal history of radiation therapy   .  RBBB   . Stroke [facility])    Incidental finding MRI 2002 L lacunar infarct  . Wears dentures    top   Past Surgical History:  Past Surgical History:  Procedure Laterality Date  . ABDOMINAL HYSTERECTOMY    . BILATERAL SALPINGOOPHORECTOMY  8/03   Lap BSO (R ovarian fibroma) and adhesion lysis  . BOWEL RESECTION  2003   ileocolectomy with anastomosis 2/2 adhesions  . BREAST BIOPSY    . BREAST LUMPECTOMY Right   . BREAST LUMPECTOMY WITH NEEDLE LOCALIZATION AND AXILLARY SENTINEL LYMPH NODE BX Right 04/22/2013   Procedure: BREAST LUMPECTOMY WITH NEEDLE LOCALIZATION AND  AXILLARY SENTINEL LYMPH NODE BX;  Surgeon: Stark Klein, MD;  Location: [place];  Service: General;  Laterality: Right;  . CARDIAC CATHETERIZATION     2 stents  . CHOLECYSTECTOMY    . HEMICOLECTOMY     R sided hemicolectomy  . HERNIA REPAIR     Ventral hernia repair  . PTCA  4/06    Assessment / Plan / Recommendation Clinical Impression Patient is a 72 y.o. year old female with history of CAD maintained on aspirin and Plavix, breast cancer maintained on arimidex, diabetes mellitus, chronic systolic congestive heart failure, CKD stage II. Per chart review patient lives with her daughter. Independent prior to admission. Presented 07/23/2016 with progressive cough, chest pain and hypoxia. She was found to be septic with large left lung infiltration started on IV antibiotic coverage for CAP. She had a brief PEA arrest post bronchoscopy revealing alveolar hemorrhage. Hospital course significant for progressive shortness of breath requiring intubation, cardiogenic shock requiring pressors, ARDS with acute blood loss anemia and progressive leukocytosis with fevers. Infectious disease consulted for input and recommended 8-day course of Merrem to treat aspiration event. Fevers did resolve tolerated extubation 07/31/2017. She did have bouts of delirium and agitation and continued to improve. Subcutaneous heparin for DVT prophylaxis. Currently maintained on a dysphagia #2 thin liquid diet. Physical and occupational therapy evaluations completed with recommendations of physical medicine rehab consult. Patient was admitted for a comprehensive rehab program. Patient transferred to CIR on 08/06/2017 .    Patient demonstrates severe cognitive impairments impacting initiation, attention, problem solving, recall, awareness and overall safety with functional and familiar tasks. Patient also demonstrates decreased verbal expression with what appears to be language of confusion. Suspect verbal deficits are  cognitive based, however, continued diagnostic treatment is needed. Patient consumed thin liquids via straw without overt s/s of aspiration and demonstrated what appeared to be a timely swallow initiation. However, mildly prolonged mastication with decreased awareness of bolus was noted with solid textures. Recommend patient continue current diet of Dys. 2 textures with thin liquids. Patient would benefit from skilled SLP intervention to maximize her swallowing and cognitive-linguistic function prior to discharge.     Skilled Therapeutic Interventions          Patient administered a BSE and cognitive-linguistic evaluation. Please see above for details.   SLP Assessment  Patient will need skilled Speech Lanaguage Pathology Services during CIR admission    Recommendations  SLP Diet Recommendations: Dysphagia 2 (Fine chop);Thin Liquid Administration via: Cup;Straw Medication Administration: Crushed with puree Supervision: Full supervision/cueing for compensatory strategies;Patient able to self feed Compensations: Minimize environmental distractions;Slow rate;Small sips/bites Postural Changes and/or Swallow Maneuvers: Seated upright 90 degrees Oral Care Recommendations: Oral care BID Recommendations for Other Services: Neuropsych consult Patient destination: Home Follow up Recommendations: Home Health SLP;24 hour supervision/assistance Equipment Recommended: None recommended by SLP  SLP Frequency 3 to 5 out of 7 days   SLP Duration  SLP Intensity  SLP Treatment/Interventions 3 weeks   Minumum of 1-2 x/day, 30 to 90 minutes  Cognitive remediation/compensation;Internal/external aids;Environmental controls;Speech/Language facilitation;Therapeutic Activities;Patient/family education;Dysphagia/aspiration precaution training;Cueing hierarchy;Functional tasks    Pain Pain Assessment Pain Scale: Faces Faces Pain Scale: No hurt  Prior Functioning Type of Home: Apartment  Lives With:  Family Available Help at Discharge: Family;Available 24 hours/day Vocation: Full time employment  Function:  Eating Eating   Modified Consistency Diet: Yes Eating Assist Level: Supervision or verbal cues;Set up assist for   Eating Set Up Assist For: Opening containers       Cognition Comprehension Comprehension assist level: Understands basic 25 - 49% of the time/ requires cueing 50 - 75% of the time  Expression   Expression assist level: Expresses basic 25 - 49% of the time/requires cueing 50 - 75% of the time. Uses single words/gestures.  Social Interaction Social Interaction assist level: Interacts appropriately 25 - 49% of time - Needs frequent redirection.  Problem Solving Problem solving assist level: Solves basic less than 25% of the time - needs direction nearly all the time or does not effectively solve problems and may need a restraint for safety  Memory Memory assist level: Recognizes or recalls less than 25% of the time/requires cueing greater than 75% of the time   Short Term Goals: Week 1: SLP Short Term Goal 1 (Week 1): Patient will initiate functional tasks with Mod A verbal cues.  SLP Short Term Goal 2 (Week 1): Patient will orient to place, time and situation with Mod A multimodal cues.  SLP Short Term Goal 3 (Week 1): Patient will demonstrate sustained attention to functional tasks for 2 minutes with Max A multimodal cues.  SLP Short Term Goal 4 (Week 1): Patient will consume current diet with minimal overt s/s of aspiration with Min A verbal cues for use of swallowing strategies.  SLP Short Term Goal 5 (Week 1): Patient will consume trials of Dys. 3 textures with efficient mastication and complete oral clearing without overt s/s of aspiration over 2 sessions prior to upgrade.  SLP Short Term Goal 6 (Week 1): Patient will verbally respond to questions in 75% of opportunities with Mod A verbal cues.   Refer to Care Plan for Long Term Goals  Recommendations for other  services: Neuropsych  Discharge Criteria: Patient will be discharged from SLP if patient refuses treatment 3 consecutive times without medical reason, if treatment goals not met, if there is a change in medical status, if patient makes no progress towards goals or if patient is discharged from hospital.  The above assessment, treatment plan, treatment alternatives and goals were discussed and mutually agreed upon: by patient and by family  Santoria Chason 08/07/2017, 4:26 PM

## 2017-08-07 NOTE — Progress Notes (Signed)
Physical Therapy Assessment and Plan  Patient Details  Name: [name] MRN: [id number] Date of Birth: [birth year]  PT Diagnosis: Cognitive deficits, Difficulty walking, Impaired cognition, Impaired sensation and Muscle weakness Rehab Potential: Fair ELOS: 21-24 days   Today's Date: 08/07/2017 PT Individual Time: 0800-0915 PT Individual Time Calculation (min): 75 min    Problem List:  Patient Active Problem List   Diagnosis Date Noted  . ARDS (adult respiratory distress syndrome) ([place]) 08/06/2017  . Debility   . Encephalopathy   . Pressure injury of skin 08/04/2017  . Pneumonia of left lung due to infectious organism   . Acute respiratory failure with hypoxemia ([place])   . Cardiac arrest, cause unspecified ([place])   . Shock circulatory ([place])   . Septic shock ([place])   . HCAP (healthcare-associated pneumonia)   . Diffuse pulmonary alveolar hemorrhage   . Chest pain 07/23/2017  . Hypoxia 07/23/2017  . SOB (shortness of breath) 10/11/2016  . Hyperparathyroidism, secondary renal ([place]) 05/02/2016  . Gout 04/11/2015  . Diabetic neuropathy associated with type 2 diabetes mellitus ([place]) 01/06/2015  . Breast cancer of lower-inner quadrant of right female breast ([place]) 03/18/2013  . Chronic venous insufficiency 01/06/2013  . Health care maintenance 05/08/2011  . Diabetes mellitus type 2 in obese ([place]) 05/03/2010  . Seasonal allergies 05/03/2010  . Labile hypertension   . CAD (coronary artery disease)   . Abdominal discomfort   . Anemia in chronic kidney disease   . History of stroke without residual deficits   . Hyperlipidemia associated with type 2 diabetes mellitus ([place])   . Chronic pain   . Chronic kidney disease, stage III (moderate) (HCC)   . Adnexal mass     Past Medical History:  Past Medical History:  Diagnosis Date  . Abdominal discomfort    Chronic N/V/D. Presumptive dx Crohn's dx per elevated p ANCA. Failed Entocort and Pentasa. Sep 2003 - ileocolectomy c anastomosis  per Dr Deon Pilling 2/2 adhesions - path was hegative for Crohns. EGD, Sm bowel follow through (11/03), and an eteroclysis (10/03) were unrevealing. Cuases hypomag and hypocalcemia.  . Adnexal mass 8/03   s/p lap BSO (R ovarian fibroma) & lysis of adhesions  . Allergy    Seasonal  . Anemia    Multifactorial. Baseline HgB 10-11 ish. B12 def - 150 in 3/10. Fe Def - ferritin 35 3/10. Both are being repleted.  . Breast cancer ([place]) 03/16/13   right, 5 o'clock  . CAD (coronary artery disease) 1996   1996 - PTCA and angioplasty diagonal branch. 2000 - Rotoblator & angiopllasty of diagonal. 2006 - subendocardial AMI, DES to proximal LAD.Marland Kitchen Also had 90% stenosis in distal apical LAD. EF 55 with apical hypokinesis. Indefinite ASA and Plavix.  . CHF (congestive heart failure) ([place])   . Chronic kidney disease    Chronic renal insuff baseline Cr 1.2 - 1.4 ish.  . Chronic pain    CT 10/10 = Spinal stenosis L2 - S1.  . Diabetes mellitus    Insulin dependent  . Hx of radiation therapy 06/02/13- 07/16/13   right rbeast 4500 cGy 25 sessions, right breast boost 1600 cGy in 8 sessions  . Hyperlipidemia    Managed with both a statin and Welchol. Welchol stopped 2014 2/2 cost and started on fenofibrate   . Hypertension    2006 B renal arteries patent. 2003 MRA - no RAS. 2003 pheo W/U Dr Hassell Done reportedly negative.  . Hypoxia 07/23/2017  . Lupus ([place])   . Personal  history of radiation therapy   . RBBB   . Stroke [facility])    Incidental finding MRI 2002 L lacunar infarct  . Wears dentures    top   Past Surgical History:  Past Surgical History:  Procedure Laterality Date  . ABDOMINAL HYSTERECTOMY    . BILATERAL SALPINGOOPHORECTOMY  8/03   Lap BSO (R ovarian fibroma) and adhesion lysis  . BOWEL RESECTION  2003   ileocolectomy with anastomosis 2/2 adhesions  . BREAST BIOPSY    . BREAST LUMPECTOMY Right   . BREAST LUMPECTOMY WITH NEEDLE LOCALIZATION AND AXILLARY SENTINEL LYMPH NODE BX Right 04/22/2013   Procedure:  BREAST LUMPECTOMY WITH NEEDLE LOCALIZATION AND AXILLARY SENTINEL LYMPH NODE BX;  Surgeon: Stark Klein, MD;  Location: [place];  Service: General;  Laterality: Right;  . CARDIAC CATHETERIZATION     2 stents  . CHOLECYSTECTOMY    . HEMICOLECTOMY     R sided hemicolectomy  . HERNIA REPAIR     Ventral hernia repair  . PTCA  4/06    Assessment & Plan Clinical Impression:  [name] is a 72 year old right-handed female with history of CAD maintained on aspirin and Plavix, breast cancer maintained on arimidex, diabetes mellitus, chronic systolic congestive heart failure, CKD stage II. Per chart review patient lives with her daughter. Independent prior to admission. Presented 07/23/2016 with progressive cough, chest pain and hypoxia. She was found to be septic with large left lung infiltration started on IV antibiotic coverage for CAP. She had a brief PEA arrest post bronchoscopy revealing alveolar hemorrhage. Hospital course significant for progressive shortness of breath requiring intubation, cardiogenic shock requiring pressors, ARDS with acute blood loss anemia and progressive leukocytosis with fevers. Infectious disease consulted for input and recommended 8-day course of Merrem to treat aspiration event. Fevers did resolve tolerated extubation 07/31/2017. She did have bouts of delirium and agitation and continued to improve. Subcutaneous heparin for DVT prophylaxis. Currently maintained on a dysphagia #2 thin liquid diet. Physical and occupational therapy evaluations completed with recommendations of physical medicine rehab consult. Patient was admitted for a comprehensive rehab program.   Patient transferred to CIR on 08/06/2017 .   Patient currently requires total with mobility secondary to muscle weakness, impaired timing and sequencing, motor apraxia, decreased coordination and decreased motor planning, decreased motor planning and ideational apraxia and decreased initiation,  decreased attention, decreased awareness, decreased problem solving, decreased safety awareness, decreased memory and delayed processing.  Prior to hospitalization, patient was independent  with mobility and lived with   in a [place] home.  Home access is 1Stairs to enter.  Patient will benefit from skilled PT intervention to maximize safe functional mobility, minimize fall risk and decrease caregiver burden for planned discharge home with 24 hour assist.  Anticipate patient will benefit from follow up [facility] at discharge.  PT - End of Session Activity Tolerance: Tolerates 10 - 20 min activity with multiple rests Endurance Deficit: Yes Endurance Deficit Description: needs frequent rest breaks PT Assessment Rehab Potential (ACUTE/IP ONLY): Fair PT Barriers to Discharge: [place] home environment;Decreased caregiver support;Medical stability;Home environment access/layout;Behavior PT Patient demonstrates impairments in the following area(s): Balance;Behavior;Endurance;Motor;Pain;Perception;Safety;Sensory PT Transfers Functional Problem(s): Bed Mobility;Bed to Chair;Car;Furniture;Floor PT Locomotion Functional Problem(s): Ambulation;Wheelchair Mobility;Stairs PT Plan PT Intensity: Minimum of 1-2 x/day ,45 to 90 minutes PT Frequency: 5 out of 7 days PT Duration Estimated Length of Stay: 21-24 days PT Treatment/Interventions: Ambulation/gait training;Balance/vestibular training;Cognitive remediation/compensation;Discharge planning;DME/adaptive equipment instruction;Functional mobility training;Neuromuscular re-education;Pain management;Patient/family education;Stair training;Therapeutic Activities;Therapeutic Exercise;UE/LE Strength  taining/ROM;UE/LE Coordination activities;Wheelchair propulsion/positioning PT Transfers Anticipated Outcome(s): min A PT Locomotion Anticipated Outcome(s): min A at w/c level PT Recommendation Follow Up Recommendations: Home health PT;24 hour  supervision/assistance Patient destination: Home Equipment Recommended: To be determined  Skilled Therapeutic Intervention Evaluation completed (see details above and below) with education on PT POC and goals and individual treatment initiated with focus on functional transfers. Pt's daughter present at very beginning of therapy session but steps out prior to initiation of PT evaluation, unable to obtain PLOF from patient due to cognitive deficits so information obtained from patient chart. Rolling L/R with max A with use of bedrails and max multimodal cues to perform rolling. Supine to sit with max A with HOB elevated and use of bedrails. Pt exhibits poor initiation and sequencing. Pt is able to state that she understands when asked to perform a task but then is unable to initiate movement to complete the task without max multimodal cueing. Sitting balance EOB with close SBA with BUE and BLE supported. Attempt sit to stand to RW but pt keeps saying "wait a minute, wait a minute". Pt is unable to articulate why she needs to wait and why she is unable to perform transfer. Even with total A pt is unable to safely come to standing due to poor initiation and her not being able to motor plan to complete and assist with transfer. Lateral scoot transfer bed to w/c with total A due to poor initiation and motor planning. Functional assessment of BLE limited by cognitive deficits. Pt does keep indicating she wishes to return to bed once seated in w/c. Attempt to have pt eat some breakfast, pt does initiate drinking milk but declines to eat anything else, reporting that her daughter brought her something for breakfast. Pt is unable to recall what she ate for breakfast. Attempt sit to stand to stedy with assist for hand placement during transfer. Again pt states "wait a minute" and is unable to safely perform transfer even with max multimodal cueing. Lateral scoot transfer w/c to bed total A. Sit to supine total A. Pt has  pain in L hand and some swelling noted, unable to rate pain but is painful to the touch, medical team aware. BP 174/70 in supine, SpO2 100%. With supine rest break BP 159/75, RN notified of elevated BP this AM. Pt left supine in bed with LLE elevated, needs in reach, bed alarm in place.  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 Leisure: Hobbies-yes (Comment) Comments: very involved in church Vision/Perception  Vision - History Baseline Vision: Wears glasses only for reading Praxis Praxis: Impaired Praxis Impairment Details: Ideation;Initiation;Ideomotor;Motor planning  Cognition Overall Cognitive Status: Impaired/Different from baseline Arousal/Alertness: Awake/alert Orientation Level: Oriented to person;Disoriented to place;Disoriented to time;Disoriented to situation Attention: Focused Focused Attention: Impaired Memory: Impaired Awareness: Impaired Awareness Impairment: Intellectual impairment;Emergent impairment;Anticipatory impairment Problem Solving: Impaired Safety/Judgment: Impaired Sensation Sensation Light Touch: Appears Intact(unable to formally assess due to cognitive deficits) Proprioception: Impaired by gross assessment(unsure if impaired vs cognitive deficit) Coordination Gross Motor Movements are Fluid and Coordinated: No Coordination and Movement Description:  limited by weakness Trunk/Postural  Assessment  Cervical Assessment Cervical Assessment: Within Functional Limits Thoracic Assessment Thoracic Assessment: Within Functional Limits Lumbar Assessment Lumbar Assessment: Within Functional Limits Postural Control Postural Control: Deficits on evaluation Righting Reactions: impaired Protective Responses: impaired  Balance Balance Balance Assessed: Yes Static Sitting Balance Static Sitting - Balance Support: Bilateral upper extremity supported;Feet supported Static Sitting - Level of Assistance: 5: Stand by assistance Dynamic Sitting Balance Dynamic Sitting - Balance Support: Left upper extremity supported;Feet supported Dynamic Sitting - Level of Assistance: 4: Min assist Extremity Assessment   RLE Assessment RLE Assessment: Exceptions to [facility] RLE Strength RLE Overall Strength Comments: 2/5 hip flex, 4/5 knee ext, all others 3/5 grossly based on observational assessment(testing limited due to cognitive deficits) LLE Assessment LLE Assessment: Exceptions to [facility] LLE Strength LLE Overall Strength Comments: 2/5 hip flex, 4/5 knee ext, all others 3/5 grossly based on observational assessment(testing limited due to cognitive deficits)   See Function Navigator for Current Functional Status.   Refer to Care Plan for Long Term Goals  Recommendations for other services: None   Discharge Criteria: Patient will be discharged from PT if patient refuses treatment 3 consecutive times without medical reason, if treatment goals not met, if there is a change in medical status, if patient makes no progress towards goals or if patient is discharged from hospital.  The above assessment, treatment plan, treatment alternatives and goals were discussed and mutually agreed upon: No family available/patient unable  Excell Seltzer, PT, DPT  08/07/2017, 12:07 PM

## 2017-08-08 ENCOUNTER — Inpatient Hospital Stay (HOSPITAL_COMMUNITY): Payer: Medicare Other | Admitting: Speech Pathology

## 2017-08-08 ENCOUNTER — Inpatient Hospital Stay (HOSPITAL_COMMUNITY): Payer: Medicare Other | Admitting: Occupational Therapy

## 2017-08-08 ENCOUNTER — Inpatient Hospital Stay (HOSPITAL_COMMUNITY): Payer: Medicare Other | Admitting: Physical Therapy

## 2017-08-08 ENCOUNTER — Ambulatory Visit: Payer: Medicare Other | Admitting: Internal Medicine

## 2017-08-08 DIAGNOSIS — N390 Urinary tract infection, site not specified: Secondary | ICD-10-CM

## 2017-08-08 DIAGNOSIS — A499 Bacterial infection, unspecified: Secondary | ICD-10-CM

## 2017-08-08 LAB — GLUCOSE, CAPILLARY
GLUCOSE-CAPILLARY: 115 mg/dL — AB (ref 65–99)
GLUCOSE-CAPILLARY: 231 mg/dL — AB (ref 65–99)
GLUCOSE-CAPILLARY: 141 mg/dL — AB (ref 65–99)
Glucose-Capillary: 183 mg/dL — ABNORMAL HIGH (ref 65–99)

## 2017-08-08 MED ORDER — CEPHALEXIN 250 MG PO CAPS
250.0000 mg | ORAL_CAPSULE | Freq: Three times a day (TID) | ORAL | Status: DC
  Administered 2017-08-08 – 2017-08-17 (×30): 250 mg via ORAL
  Filled 2017-08-08 (×31): qty 1

## 2017-08-08 NOTE — IPOC Note (Signed)
Overall Plan of Care [facility]) Patient Details Name: [name] MRN: [id number] DOB: 10/14/1945  Admitting Diagnosis: <principal problem not specified> debility and encephalopathy  Hospital Problems: Active Problems:   Labile hypertension   Diabetes mellitus type 2 in obese (HCC)   ARDS (adult respiratory distress syndrome) (HCC)   Debility   Encephalopathy     Functional Problem List: Nursing Behavior, Perception, Skin Integrity  PT Balance, Behavior, Endurance, Motor, Pain, Perception, Safety, Sensory  OT Balance, Safety, Cognition, Skin Integrity, Vision, Edema, Endurance, Motor, Pain  SLP Cognition  TR         Basic ADL's: OT Grooming, Bathing, Dressing, Toileting     Advanced  ADL's: OT       Transfers: PT Bed Mobility, Bed to Chair, Car, [name], Futures trader, Metallurgist: PT Ambulation, Emergency planning/management officer, Stairs     Additional Impairments: OT None  SLP Social Cognition, Swallowing   Social Interaction, Problem Solving, Memory, Attention, Awareness  TR      Anticipated Outcomes Item Anticipated Outcome  Self Feeding Supervision  Swallowing  Supervision    Basic self-care  Supervision - min A  Toileting  min A   Bathroom Transfers min A  Bowel/Bladder  pt will be able to alert staff to incontinent episodes   Transfers  min A  Locomotion  min A at w/c level  Communication  Min A  Cognition  Min A   Pain  pt will maintain pain level less than 3  Safety/Judgment  pt will use call bell to call for staff assistance    Therapy Plan: PT Intensity: Minimum of 1-2 x/day ,45 to 90 minutes PT Frequency: 5 out of 7 days PT Duration Estimated Length of Stay: 21-24 days OT Intensity: Minimum of 1-2 x/day, 45 to 90 minutes OT Frequency: 5 out of 7 days OT Duration/Estimated Length of Stay: 21-24 days SLP Intensity: Minumum of 1-2 x/day, 30 to 90 minutes SLP Frequency: 3 to 5 out of 7 days SLP Duration/Estimated Length of Stay:  3 weeks     Team Interventions: Nursing Interventions Bladder Management, Disease Management/Prevention, Pain Management, Skin Care/Wound Management, Medication Management, Cognitive Remediation/Compensation  PT interventions Ambulation/gait training, Balance/vestibular training, Cognitive remediation/compensation, Discharge planning, DME/adaptive equipment instruction, Functional mobility training, Neuromuscular re-education, Pain management, Patient/family education, Stair training, Therapeutic Activities, Therapeutic Exercise, UE/LE Strength taining/ROM, UE/LE Coordination activities, Wheelchair propulsion/positioning  OT Interventions Balance/vestibular training, Discharge planning, Pain management, Self Care/advanced ADL retraining, Therapeutic Activities, UE/LE Coordination activities, Cognitive remediation/compensation, Functional mobility training, Patient/family education, Therapeutic Exercise, Visual/perceptual remediation/compensation, Community reintegration, DME/adaptive equipment instruction, Psychosocial support, UE/LE Strength taining/ROM, Wheelchair propulsion/positioning  SLP Interventions Cognitive remediation/compensation, Internal/external aids, Environmental controls, Speech/Language facilitation, Therapeutic Activities, Patient/family education, Dysphagia/aspiration precaution training, Cueing hierarchy, Functional tasks  TR Interventions    SW/CM Interventions Discharge Planning, Psychosocial Support, Patient/Family Education   Barriers to Discharge MD  Medical stability  Nursing Medical stability, Weight bearing restrictions, Incontinence, Wound Care breast cancer, cognitive deficits  PT Inaccessible home environment, Decreased caregiver support, Medical stability, Home environment access/layout, Behavior    OT Decreased caregiver support    SLP      SW       Team Discharge Planning: Destination: PT-Home ,OT- Home , SLP-Home Projected Follow-up: PT-Home health PT, 24  hour supervision/assistance, OT-  Home health OT, 24 hour supervision/assistance, SLP-Home Health SLP, 24 hour supervision/assistance Projected Equipment Needs: PT-To be determined, OT- To be determined, SLP-None recommended by SLP Equipment Details: PT- ,  OT-  Patient/family involved in discharge planning: PT- Patient unable/family or caregiver not available,  OT-Patient, Family member/caregiver, SLP-Patient, Family member/caregiver  MD ELOS: 21 days Medical Rehab Prognosis:  Excellent Assessment: The patient has been admitted for CIR therapies with the diagnosis of debility and encephalopathy. The team will be addressing functional mobility, strength, stamina, balance, safety, adaptive techniques and equipment, self-care, bowel and bladder mgt, patient and caregiver education, neuromuscular reeducation, sleep-wake cycle restoration, cognition, behavior, community reentry. Goals have been set at supervision to minimal assistance with self-care and ADLs, minimal assistance with transfers and wheelchair mobility, and minimal assistance with cognition.Meredith Staggers, MD, FAAPMR      See Team Conference Notes for weekly updates to the plan of care

## 2017-08-08 NOTE — Progress Notes (Signed)
Patient resting at intervals , family member at bedside, Remain on sleep chart and resting without signs of agitation, Left hand edematous and sensitive to touch, elevated on pillow. Monitor

## 2017-08-08 NOTE — Progress Notes (Signed)
Social Work  Social Work Assessment and Plan  Patient Details  Name: [name] MRN: [id number] Date of Birth: March 02, 1946  Today's Date: 08/08/2017  Problem List:  Patient Active Problem List   Diagnosis Date Noted  . Sleep disturbance   . Acute blood loss anemia   . Leukocytosis   . Essential hypertension   . Urinary frequency   . Type 2 diabetes mellitus with peripheral neuropathy (HCC)   . Benign essential HTN   . Hypertensive crisis   . Stage 2 chronic kidney disease   . Dysphagia   . ARDS (adult respiratory distress syndrome) ([place]) 08/06/2017  . Debility   . Encephalopathy   . Pressure injury of skin 08/04/2017  . Pneumonia of left lung due to infectious organism   . Acute respiratory failure with hypoxemia ([place])   . Cardiac arrest, cause unspecified ([place])   . Shock circulatory ([place])   . Septic shock ([place])   . HCAP (healthcare-associated pneumonia)   . Diffuse pulmonary alveolar hemorrhage   . Chest pain 07/23/2017  . Hypoxia 07/23/2017  . SOB (shortness of breath) 10/11/2016  . Hyperparathyroidism, secondary renal ([place]) 05/02/2016  . Gout 04/11/2015  . Diabetic neuropathy associated with type 2 diabetes mellitus ([place]) 01/06/2015  . Breast cancer of lower-inner quadrant of right female breast ([place]) 03/18/2013  . Chronic venous insufficiency 01/06/2013  . Health care maintenance 05/08/2011  . Diabetes mellitus type 2 in obese ([place]) 05/03/2010  . Seasonal allergies 05/03/2010  . Labile hypertension   . CAD (coronary artery disease)   . Abdominal discomfort   . Anemia in chronic kidney disease   . History of stroke without residual deficits   . Hyperlipidemia associated with type 2 diabetes mellitus ([place])   . Chronic pain   . Chronic kidney disease, stage III (moderate) (HCC)   . Adnexal mass    Past Medical History:  Past Medical History:  Diagnosis Date  . Abdominal discomfort    Chronic N/V/D. Presumptive dx Crohn's dx per elevated p ANCA. Failed  Entocort and Pentasa. Sep 2003 - ileocolectomy c anastomosis per Dr Deon Pilling 2/2 adhesions - path was hegative for Crohns. EGD, Sm bowel follow through (11/03), and an eteroclysis (10/03) were unrevealing. Cuases hypomag and hypocalcemia.  . Adnexal mass 8/03   s/p lap BSO (R ovarian fibroma) & lysis of adhesions  . Allergy    Seasonal  . Anemia    Multifactorial. Baseline HgB 10-11 ish. B12 def - 150 in 3/10. Fe Def - ferritin 35 3/10. Both are being repleted.  . Breast cancer ([place]) 03/16/13   right, 5 o'clock  . CAD (coronary artery disease) 1996   1996 - PTCA and angioplasty diagonal branch. 2000 - Rotoblator & angiopllasty of diagonal. 2006 - subendocardial AMI, DES to proximal LAD.Marland Kitchen Also had 90% stenosis in distal apical LAD. EF 55 with apical hypokinesis. Indefinite ASA and Plavix.  . CHF (congestive heart failure) ([place])   . Chronic kidney disease    Chronic renal insuff baseline Cr 1.2 - 1.4 ish.  . Chronic pain    CT 10/10 = Spinal stenosis L2 - S1.  . Diabetes mellitus    Insulin dependent  . Hx of radiation therapy 06/02/13- 07/16/13   right rbeast 4500 cGy 25 sessions, right breast boost 1600 cGy in 8 sessions  . Hyperlipidemia    Managed with both a statin and Welchol. Welchol stopped 2014 2/2 cost and started on fenofibrate   . Hypertension    2006  B renal arteries patent. 2003 MRA - no RAS. 2003 pheo W/U Dr Hassell Done reportedly negative.  . Hypoxia 07/23/2017  . Lupus ([place])   . Personal history of radiation therapy   . RBBB   . Stroke [facility])    Incidental finding MRI 2002 L lacunar infarct  . Wears dentures    top   Past Surgical History:  Past Surgical History:  Procedure Laterality Date  . ABDOMINAL HYSTERECTOMY    . BILATERAL SALPINGOOPHORECTOMY  8/03   Lap BSO (R ovarian fibroma) and adhesion lysis  . BOWEL RESECTION  2003   ileocolectomy with anastomosis 2/2 adhesions  . BREAST BIOPSY    . BREAST LUMPECTOMY Right   . BREAST LUMPECTOMY WITH NEEDLE LOCALIZATION AND  AXILLARY SENTINEL LYMPH NODE BX Right 04/22/2013   Procedure: BREAST LUMPECTOMY WITH NEEDLE LOCALIZATION AND AXILLARY SENTINEL LYMPH NODE BX;  Surgeon: Stark Klein, MD;  Location: [facility];  Service: General;  Laterality: Right;  . CARDIAC CATHETERIZATION     2 stents  . CHOLECYSTECTOMY    . HEMICOLECTOMY     R sided hemicolectomy  . HERNIA REPAIR     Ventral hernia repair  . PTCA  4/06   Social History:  reports that she has never smoked. She has never used smokeless tobacco. She reports that she does not drink alcohol or use drugs.  Family / Support Systems Marital Status: Separated How Long?: "a long time" - not involved per daughter Patient Roles: Parent, Psychologist, occupational, Caregiver Children: all children live locally:  daughter, Hattie Perch @ (C) [phone];  Kelli Hope @ (C) [phone]; Alcario Drought @ (C) [phone] and son, Wallie Renshaw Anticipated Caregiver: daugher and family are taking shifts and will hire assist from [facility] at d/c as needed to fill in the gaps Ability/Limitations of Caregiver: family takes shifts and will hire assist Caregiver Availability: 24/7 Family Dynamics: Daughter reports that all children are involved and will share in providing all needed assistance.  Daughter, [name], very "in charge" and asking questions about resources available and appears to be primary contact.  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  Daughter provides most information.  Daughter denies any s/s of emotional distress, however, we did discuss option of neuropsych involvedment and daughter is agreeable. Will involved when more appropriate to cognition and communication. Recent Psychosocial Issues: None Pyschiatric History: none Substance Abuse History: none  Patient / Family Perceptions, Expectations & Goals Pt/Family understanding of illness & functional limitations: Daughter very concerned about pt's decreased cognition.  She has a general understanding of multiple medical issues, however, requesting CT or MRI of brain.  Generally family has a good understanding of her current functional limitations/ need for CIR. Premorbid pt/family roles/activities: Pt was very active.  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 Anticipated changes in roles/activities/participation: Per goals of min/ mod assist overall,  family will need to provide shared caregiver support. Pt/family expectations/goals: Daughter very focused on getting new scans.  She is hopeful pt can reach a supervision/ min assist [place]: None Premorbid Home Care/DME Agencies: None Transportation available at discharge: yes Resource referrals recommended: Neuropsychology, Support group (specify)  Discharge Planning Living Arrangements: Other (Comment)(roomate) Support Systems: Children, Other relatives, Friends/neighbors Type of Residence: Private residence Insurance Resources: [place] Medicare;  [place]) Financial Resources: Employment, Radio broadcast assistant Screen Referred: No Living Expenses: Rent Money Management: Patient Does the patient have any problems obtaining your medications?:  No Home Management: pt and roomate share responsibilities Patient/Family Preliminary Plans: Pt likely to d/c home with family coming in to provide 24/7 support. Social Work Anticipated Follow Up Needs: HH/OP Expected length of stay: 21-24 days  Clinical Impression Unfortunate woman here following acute admission for sepsis and now on CIR for debility.  Daughter at bedside and assists with completion of assessment interview as pt with impaired cognition.  Pt lying in bed and little engagement in the interview process.  Very little eye contact but does "mouth" some one word answers.  May benefit from neuropsychology involvement while here.  Family to coordinate 24/7 support for d/c.  Continue to follow.   Ashlin Kreps 08/08/2017, 4:22 PM

## 2017-08-08 NOTE — Progress Notes (Signed)
[facility] PHYSICAL MEDICINE & REHABILITATION     PROGRESS NOTE    Subjective/Complaints: Did not sleep well last night.  Patient apparently had some urinary urgency.  ROS: Limited due to cognitive/behavioral   Objective:  No results found. Recent Labs    08/06/17 1856 08/07/17 0609  WBC 14.4* 13.7*  HGB 9.4* 8.8*  HCT 30.1* 28.1*  PLT 286 278   Recent Labs    08/06/17 0440 08/06/17 1856 08/07/17 0609  NA 142  --  144  K 3.9  --  3.6  CL 115*  --  115*  GLUCOSE 118*  --  133*  BUN 27*  --  28*  CREATININE 1.03* 1.07* 1.14*  CALCIUM 9.0  --  9.2   CBG (last 3)  Recent Labs    08/07/17 1652 08/07/17 2135 08/08/17 0638  GLUCAP 146* 155* 115*    Wt Readings from Last 3 Encounters:  08/07/17 80 kg (176 lb 5.9 oz)  08/06/17 81.7 kg (180 lb 1.9 oz)  06/19/17 87.1 kg (192 lb 1.6 oz)     Intake/Output Summary (Last 24 hours) at 08/08/2017 0931 Last data filed at 08/07/2017 1900 Gross per 24 hour  Intake 240 ml  Output -  Net 240 ml    Vital Signs: Blood pressure (!) 144/63, pulse 95, temperature 98.7 F (37.1 C), resp. rate 18, height 5' 4"  (1.626 m), weight 80 kg (176 lb 5.9 oz), SpO2 99 %. Physical Exam:  Constitutional: No distress . Vital signs reviewed. HEENT: EOMI, oral membranes moist Neck: supple Cardiovascular: RRR without murmur. No JVD    Respiratory: CTA Bilaterally without wheezes or rales. Normal effort    GI: BS +, non-tender, non-distended    Musculoskeletal: Left hand with edema and tenderness at prior IV site.  No obvious erythema, sl warmth Neurological:   Very flat.  Delayed processing.  UE movement  bilaterlly 3-4/5. LE: 1-2/5HF, 2/5 KE and 3/5 ADF/PF----inconsistent due to effort  Psychiatric:  Flat    Assessment/Plan: 1. Functional deficits secondary to debility/encephalopathy which require 3+ hours per day of interdisciplinary therapy in a comprehensive inpatient rehab setting. Physiatrist is providing close team supervision  and 24 hour management of active medical problems listed below. Physiatrist and rehab team continue to assess barriers to discharge/monitor patient progress toward functional and medical goals.  Function:  Bathing Bathing position      Bathing parts      Bathing assist        Upper Body Dressing/Undressing Upper body dressing   What is the patient wearing?: Hospital gown                Upper body assist Assist Level: Touching or steadying assistance(Pt > 75%)      Lower Body Dressing/Undressing Lower body dressing   What is the patient wearing?: Non-skid slipper socks, Ted Hose           Non-skid slipper socks- Performed by helper: Don/doff right sock, Don/doff left sock               TED Hose - Performed by helper: Don/doff right TED hose, Don/doff left TED hose  Lower body assist Assist for lower body dressing: (total )      Toileting Toileting Toileting activity did not occur: No continent bowel/bladder event        Toileting assist     Transfers Chair/bed transfer   Chair/bed transfer method: Lateral scoot Chair/bed transfer assist level: Total assist (Pt < 25%) Chair/bed  transfer assistive device: Bedrails, Armrests     Locomotion Ambulation Ambulation activity did not occur: Safety/medical concerns         Wheelchair Wheelchair activity did not occur: Safety/medical concerns Type: Manual      Cognition Comprehension Comprehension assist level: Understands basic 25 - 49% of the time/ requires cueing 50 - 75% of the time  Expression Expression assist level: Expresses basic 25 - 49% of the time/requires cueing 50 - 75% of the time. Uses single words/gestures.  Social Interaction Social Interaction assist level: Interacts appropriately 25 - 49% of time - Needs frequent redirection.  Problem Solving Problem solving assist level: Solves basic less than 25% of the time - needs direction nearly all the time or does not effectively solve problems  and may need a restraint for safety  Memory Memory assist level: Recognizes or recalls less than 25% of the time/requires cueing greater than 75% of the time   Medical Problem List and Plan:  1. Debility and encephalopathy secondary to ARDS/cardiogenic shock/PEA arrest /CAP/multi-medical-extubated 07/31/2017  -continue therapies  2. DVT Prophylaxis/Anticoagulation: Subcutaneous heparin. Monitor for any bleeding episodes  3. Pain Management: Tylenol as needed  4. Mood/delirium/insomnia: Seroquel   24m QHS at 8:30pm  -?continue Atarax 10 mg nightly----may be actually increasing confusion  -Continue sleep-wake  chart  -Bladder may be affecting sleep also. 5. Neuropsych: This patient is not capable of making decisions on her own behalf.  6. Skin/Wound Care: Routine skin checks   -Left hand where IV infiltrated still with swelling and tenderness.  No overt erythema.  -Begin Keflex per below 7. Fluids/Electrolytes/Nutrition: encourage PO   -BUN/Cr actually improved 28/1.14 5/22 8. CAP/fevers. 8-day course of Merrem completed, afebrile at present  9. Dysphagia. Dysphagia #2 thin liquids. Follow-up speech therapy  -encourage PO. Doesn't like d2 diet  10. Chronic systolic congestive heart failure. Monitor for any signs of fluid overload  11. History of CAD. Continue aspirin and Plavix  12. CKD stage II. Follow-up chemistries stable to improved 13. Hypertension. Norvasc 10 mg daily, Coreg 12.5 mg twice daily, Cozaar 100 mg daily.  -blood pressures with improvement since admission.  -Continue current treatment plan 5/23 14. Diabetes mellitus with peripheral neuropathy. Lantus insulin 15 units nightly.   -fair control at present 5/23 15. History of breast cancer. Continue arimidex  16.  Urinary frequency/odor: Check urinalysis and culture  -Begin empiric Keflex which would potentially cover cellulitis in her left hand   LOS (Days) 2 A FACE TO FACE EVALUATION WAS PERFORMED  ZMeredith Staggers  MD 08/08/2017 9:31 AM

## 2017-08-08 NOTE — Plan of Care (Signed)
  Problem: RH PAIN MANAGEMENT Goal: RH STG PAIN MANAGED AT OR BELOW PT'S PAIN GOAL Description Min assistance  Outcome: Progressing  Continue to administered pain regimen   Problem: RH SAFETY Goal: RH STG ADHERE TO SAFETY PRECAUTIONS W/ASSISTANCE/DEVICE Description STG Adhere to Safety Precautions With Min Assistance/Device.  Outcome: Progressing  Keep head of up when feeding, proper footwear. Call light at hand

## 2017-08-08 NOTE — Progress Notes (Signed)
Speech Language Pathology Daily Session Note  Patient Details  Name: [name] MRN: [id number] Date of Birth: 08/21/1945  Today's Date: 08/08/2017 SLP Individual Time: 0715-0815 SLP Individual Time Calculation (min): 60 min  Short Term Goals: Week 1: SLP Short Term Goal 1 (Week 1): Patient will initiate functional tasks with Mod A verbal cues.  SLP Short Term Goal 2 (Week 1): Patient will orient to place, time and situation with Mod A multimodal cues.  SLP Short Term Goal 3 (Week 1): Patient will demonstrate sustained attention to functional tasks for 2 minutes with Max A multimodal cues.  SLP Short Term Goal 4 (Week 1): Patient will consume current diet with minimal overt s/s of aspiration with Min A verbal cues for use of swallowing strategies.  SLP Short Term Goal 5 (Week 1): Patient will consume trials of Dys. 3 textures with efficient mastication and complete oral clearing without overt s/s of aspiration over 2 sessions prior to upgrade.  SLP Short Term Goal 6 (Week 1): Patient will verbally respond to questions in 75% of opportunities with Mod A verbal cues.   Skilled Therapeutic Interventions: Skilled treatment session focused on cognitive and dysphagia goals. Upon arrival, patient was awake while upright in bed. SLP facilitated session by providing Total A for problem solving while donning adhesive on dentures prior to PO intake. Patient required Mod A verbal cues for patient to initiate tray set-up with breakfast meal of Dys. 2 textures with thin liquids. Patient also required Max encouragement for PO intake. Patient initially self-fed ~2 bites with Max A verbal cues but then required SLP to feed patient to maximize PO intake.  Patient with increased verbal expression this session but continues to demonstrate intermittent language of confusion with topic perseveration. Patient also required Max A verbal cues for sustained attention to self-feeding for ~30-60 second intervals. Patient  consumed breakfast meal without overt s/s of aspiration but demonstrated prolonged AP transit with intermittent oral holding of boluses which required a liquid wash to clear. Recommend patient continue current diet with full supervision. Patient left upright in bed with alarm on and all needs within reach. Continue with current plan of care.      Function:  Eating Eating   Modified Consistency Diet: Yes Eating Assist Level: Supervision or verbal cues;Set up assist for;Hand over hand assist;Helper feeds patient   Eating Set Up Assist For: Opening containers       Cognition Comprehension Comprehension assist level: Understands basic 25 - 49% of the time/ requires cueing 50 - 75% of the time  Expression   Expression assist level: Expresses basic 25 - 49% of the time/requires cueing 50 - 75% of the time. Uses single words/gestures.  Social Interaction Social Interaction assist level: Interacts appropriately 25 - 49% of time - Needs frequent redirection.  Problem Solving Problem solving assist level: Solves basic less than 25% of the time - needs direction nearly all the time or does not effectively solve problems and may need a restraint for safety  Memory Memory assist level: Recognizes or recalls less than 25% of the time/requires cueing greater than 75% of the time    Pain No/Denies Pain   Therapy/Group: Individual Therapy  Harriett Azar 08/08/2017, 10:19 AM

## 2017-08-08 NOTE — Progress Notes (Signed)
Occupational Therapy Session Note  Patient Details  Name: [name] MRN: [id number] Date of Birth: [birth year]  Today's Date: 08/08/2017 OT Individual Time: 1300-1339 OT Individual Time Calculation (min): 39 min  21 missed minutes from fatigue  Short Term Goals: Week 1:  OT Short Term Goal 1 (Week 1): Pt will perform toilet transfer with max A stand pivot transfer.  OT Short Term Goal 2 (Week 1): Pt will initiate UB bathing with increased time and mod multimodal cues.  OT Short Term Goal 3 (Week 1): Pt will perform UB dressing with mod A in order to decrease level of assist with self care.  Skilled Therapeutic Interventions/Progress Updates:    Upon entering the room, pt in bed with family member present with lunch tray. Caregiver leaving the room and distractions limited. Pt able to feed self multiple bites of food with occasional assistance to scoop food and place utensil in pt's hand. Pt on bedpan when entering the room as well and rolling L <> R with mod A and following verbal commands with increased time. Pt having small BM and required total A for hygiene. Pt returned to supine and able to apply lotion to hands with min demonstrational cuing. Pt then began to fall asleep during session and having difficulty keep eyes open. RN reports pt slept 2 hours during the night. Pt left to rest with bed alarm activated and call bell within reach upon exiting the room.   Therapy Documentation Precautions:  Precautions Precautions: Fall Precaution Comments: fearful of falling Restrictions Weight Bearing Restrictions: No General:   Vital Signs: Therapy Vitals Temp: 98.4 F (36.9 C) Temp Source: Oral Pulse Rate: 86 Resp: 18 BP: (!) 154/72 Patient Position (if appropriate): Sitting Oxygen Therapy SpO2: 100 % Pain: Pain Assessment Pain Score: 6    See Function Navigator for Current Functional Status.   Therapy/Group: Individual Therapy  [name] 08/08/2017, 4:16 PM

## 2017-08-08 NOTE — Progress Notes (Signed)
Physical Therapy Note  Patient Details  Name: [name] MRN: [id number] Date of Birth: 06/01/1945 Today's Date: 08/08/2017    Time: 830-941 71 minutes  1:1 No c/o pain.  Pt awake and agreeable to therapy.  Pt requires mod A for supine to sit with increased time.  Pt then states she needs to "potty".  Pt performs stand pivot transfer to [facility] with mod A and is continent of bowel and bladder. +2 assist needed for clothing management and hygiene with mod A for standing balance.  Pt performs stand pivot transfers x 3 with mod A to bed and w/c.  Standing in parallel bars with pt able to take 2 steps fwd/backward x 3 attempts.  Pt pleased with progress. Pt performs sit to stand to place chair alarm under chair with mod A. Pt left in w/c with chair alarm, quick release belt, needs at hand.   Chanel Mckesson 08/08/2017, 9:42 AM

## 2017-08-09 ENCOUNTER — Inpatient Hospital Stay (HOSPITAL_COMMUNITY): Payer: Medicare Other

## 2017-08-09 ENCOUNTER — Inpatient Hospital Stay (HOSPITAL_COMMUNITY): Payer: Medicare Other | Admitting: Speech Pathology

## 2017-08-09 ENCOUNTER — Inpatient Hospital Stay (HOSPITAL_COMMUNITY): Payer: Medicare Other | Admitting: Physical Therapy

## 2017-08-09 DIAGNOSIS — E119 Type 2 diabetes mellitus without complications: Secondary | ICD-10-CM

## 2017-08-09 DIAGNOSIS — I169 Hypertensive crisis, unspecified: Secondary | ICD-10-CM

## 2017-08-09 DIAGNOSIS — R1310 Dysphagia, unspecified: Secondary | ICD-10-CM

## 2017-08-09 DIAGNOSIS — E1169 Type 2 diabetes mellitus with other specified complication: Secondary | ICD-10-CM

## 2017-08-09 DIAGNOSIS — N184 Chronic kidney disease, stage 4 (severe): Secondary | ICD-10-CM

## 2017-08-09 DIAGNOSIS — E1122 Type 2 diabetes mellitus with diabetic chronic kidney disease: Secondary | ICD-10-CM

## 2017-08-09 DIAGNOSIS — E1142 Type 2 diabetes mellitus with diabetic polyneuropathy: Secondary | ICD-10-CM

## 2017-08-09 DIAGNOSIS — N182 Chronic kidney disease, stage 2 (mild): Secondary | ICD-10-CM

## 2017-08-09 DIAGNOSIS — I10 Essential (primary) hypertension: Secondary | ICD-10-CM

## 2017-08-09 DIAGNOSIS — R350 Frequency of micturition: Secondary | ICD-10-CM

## 2017-08-09 LAB — URINALYSIS, COMPLETE (UACMP) WITH MICROSCOPIC
BILIRUBIN URINE: NEGATIVE
Glucose, UA: 50 mg/dL — AB
KETONES UR: NEGATIVE mg/dL
NITRITE: NEGATIVE
Protein, ur: 100 mg/dL — AB
SPECIFIC GRAVITY, URINE: 1.016 (ref 1.005–1.030)
pH: 5 (ref 5.0–8.0)

## 2017-08-09 LAB — GLUCOSE, CAPILLARY
GLUCOSE-CAPILLARY: 102 mg/dL — AB (ref 65–99)
Glucose-Capillary: 148 mg/dL — ABNORMAL HIGH (ref 65–99)
Glucose-Capillary: 177 mg/dL — ABNORMAL HIGH (ref 65–99)
Glucose-Capillary: 111 mg/dL — ABNORMAL HIGH (ref 65–99)

## 2017-08-09 MED ORDER — CARVEDILOL 25 MG PO TABS
25.0000 mg | ORAL_TABLET | Freq: Two times a day (BID) | ORAL | Status: DC
  Administered 2017-08-09 – 2017-08-22 (×26): 25 mg via ORAL
  Filled 2017-08-09 (×27): qty 1

## 2017-08-09 NOTE — Progress Notes (Signed)
[facility] PHYSICAL MEDICINE & REHABILITATION     PROGRESS NOTE    Subjective/Complaints: Pt seen sitting up in bed this AM, working with therapies.  She slept well overnight.   ROS: Limited due to cognitive/behavioral, but appears to deny CP, SOB, N/VD   Objective:  No results found. Recent Labs    08/06/17 1856 08/07/17 0609  WBC 14.4* 13.7*  HGB 9.4* 8.8*  HCT 30.1* 28.1*  PLT 286 278   Recent Labs    08/06/17 1856 08/07/17 0609  NA  --  144  K  --  3.6  CL  --  115*  GLUCOSE  --  133*  BUN  --  28*  CREATININE 1.07* 1.14*  CALCIUM  --  9.2   CBG (last 3)  Recent Labs    08/08/17 1642 08/08/17 2105 08/09/17 0625  GLUCAP 183* 141* 102*    Wt Readings from Last 3 Encounters:  08/09/17 80.1 kg (176 lb 9.4 oz)  08/06/17 81.7 kg (180 lb 1.9 oz)  06/19/17 87.1 kg (192 lb 1.6 oz)     Intake/Output Summary (Last 24 hours) at 08/09/2017 1127 Last data filed at 08/09/2017 0835 Gross per 24 hour  Intake 60 ml  Output -  Net 60 ml    Vital Signs: Blood pressure (!) 195/60, pulse 94, temperature 99.8 F (37.7 C), temperature source Oral, resp. rate 18, height 5' 4"  (1.626 m), weight 80.1 kg (176 lb 9.4 oz), SpO2 98 %. Physical Exam:  Constitutional: No distress . Vital signs reviewed. Obese. HENT: Normocephalic, atraumatic Eyes: EOMI. No discharge.  Cardiovascular: RRR. No JVD    Respiratory: CTA Bilaterally. Normal effort    GI: BS +, non-distended    Musculoskeletal: Left hand with edema and tenderness at prior IV site.   Neurological:  Delayed processing.   Motor: RUE: 4-/5 proximal to distal LUE: shoulder abduction, elbow flex/ext 4/5, hand grip 4-/5 (pain inhibition) B/l LE: HF,2/5, KE 2+/5, ADF 4-/5 Psychiatric: Flat  Assessment/Plan: 1. Functional deficits secondary to debility/encephalopathy which require 3+ hours per day of interdisciplinary therapy in a comprehensive inpatient rehab setting. Physiatrist is providing close team supervision  and 24 hour management of active medical problems listed below. Physiatrist and rehab team continue to assess barriers to discharge/monitor patient progress toward functional and medical goals.  Function:  Bathing Bathing position      Bathing parts      Bathing assist        Upper Body Dressing/Undressing Upper body dressing   What is the patient wearing?: Hospital gown                Upper body assist Assist Level: Touching or steadying assistance(Pt > 75%)      Lower Body Dressing/Undressing Lower body dressing   What is the patient wearing?: Non-skid slipper socks, Ted Hose           Non-skid slipper socks- Performed by helper: Don/doff right sock, Don/doff left sock               TED Hose - Performed by helper: Don/doff right TED hose, Don/doff left TED hose  Lower body assist Assist for lower body dressing: (total )      Toileting Toileting Toileting activity did not occur: No continent bowel/bladder event        Toileting assist     Transfers Chair/bed transfer   Chair/bed transfer method: Stand pivot Chair/bed transfer assist level: Moderate assist (Pt 50 - 74%/lift or lower)  Chair/bed transfer assistive device: Armrests     Locomotion Ambulation Ambulation activity did not occur: Safety/medical Editor, commissioning activity did not occur: Safety/medical concerns Type: Manual      Cognition Comprehension Comprehension assist level: Understands basic 25 - 49% of the time/ requires cueing 50 - 75% of the time  Expression Expression assist level: Expresses basic 25 - 49% of the time/requires cueing 50 - 75% of the time. Uses single words/gestures.  Social Interaction Social Interaction assist level: Interacts appropriately 25 - 49% of time - Needs frequent redirection.  Problem Solving Problem solving assist level: Solves basic less than 25% of the time - needs direction nearly all the time or does not effectively solve  problems and may need a restraint for safety  Memory Memory assist level: Recognizes or recalls less than 25% of the time/requires cueing greater than 75% of the time   Medical Problem List and Plan:  1. Debility and encephalopathy secondary to ARDS/cardiogenic shock/PEA arrest /CAP/multi-medical-extubated 07/31/2017   Cont CIR 2. DVT Prophylaxis/Anticoagulation: Subcutaneous heparin. Monitor for any bleeding episodes  3. Pain Management: Tylenol as needed  4. Mood/delirium/insomnia: Seroquel   21m QHS at 8:30pm   ?continue Atarax 10 mg nightly----may be actually increasing confusion   Continue sleep-wake  chart   Bladder may be affecting sleep also. 5. Neuropsych: This patient is not capable of making decisions on her own behalf.  6. Skin/Wound Care: Routine skin checks   -Left hand where IV infiltrated still with swelling and tenderness.    -Started Keflex per below 7. Fluids/Electrolytes/Nutrition: encourage PO  8. CAP/fevers. 8-day course of Merrem completed, afebrile at present  9. Dysphagia. Dysphagia #2 thin liquids. Follow-up speech therapy   Encourage PO.  10. Chronic systolic congestive heart failure. Monitor for any signs of fluid overload  Filed Weights   08/06/17 1914 08/07/17 0511 08/09/17 0401  Weight: 79.7 kg (175 lb 11.3 oz) 80 kg (176 lb 5.9 oz) 80.1 kg (176 lb 9.4 oz)  11. History of CAD. Continue aspirin and Plavix  12. CKD stage II.   Cr 1.14 on 5/22  Cont to monitor 13. Hypertension. Norvasc 10 mg daily, Cozaar 100 mg daily.   Coreg 12.5 mg twice daily, increased to 25 on 5/24  Hypertensive crisis overnight 14. Diabetes mellitus with peripheral neuropathy. Lantus insulin 15 units nightly.   Labile on 5/24 15. History of breast cancer. Continue arimidex  16.  Urinary frequency/odor:   Urinalysis equivocal, U culture pending  Started empiric Keflex which would potentially cover cellulitis in her left hand   LOS (Days) 3 A FACE TO FACE EVALUATION WAS  PERFORMED  Ankit ALorie Phenix MD 08/09/2017 11:27 AM

## 2017-08-09 NOTE — Progress Notes (Signed)
Physical Therapy Session Note  Patient Details  Name: [name] MRN: [id number] Date of Birth: 06/25/1945  Today's Date: 08/09/2017 PT Individual Time: 1030-1059 PT Individual Time Calculation (min): 29 min   Short Term Goals: Week 1:  PT Short Term Goal 1 (Week 1): Pt will complete bed mobility with mod A consistently PT Short Term Goal 2 (Week 1): Pt will tolerate sitting up in chair x 1 hour PT Short Term Goal 3 (Week 1): Pt will perform least restricitive transfer with max A consistently  Skilled Therapeutic Interventions/Progress Updates:    Pt on bedside commode with NT upon PT arrival, agreeable to therapy tx and denies pain. Pt assisted pt to perform sit<>stand with mod assist while NT performed clothing management. Pt performed stand pivot with mod assist from commode to toilet, mod assist. Pt propelled w/c x 20 ft with min assist using B UEs and increased time, encouragement to continue. Pt transported to dayroom. Pt performed x 3 sit<>stands this session with RW and mod assist. Working on standing tolerance and pre-gait task, pt only able to maintain standing for 15-20 sec bouts. Pt transported back to room and left seated in w/c with needs in reach, QRB in place and chair alarm set.   Therapy Documentation Precautions:  Precautions Precautions: Fall Precaution Comments: fearful of falling Restrictions Weight Bearing Restrictions: No   See Function Navigator for Current Functional Status.   Therapy/Group: Individual Therapy  Netta Corrigan, PT, DPT 08/09/2017, 10:45 AM

## 2017-08-09 NOTE — Progress Notes (Signed)
Speech Language Pathology Daily Session Note  Patient Details  Name: MARZELLA MIRACLE MRN: [id number] Date of Birth: Nov 25, 1945  Today's Date: 08/09/2017 SLP Individual Time: 1100-1155 SLP Individual Time Calculation (min): 55 min  Short Term Goals: Week 1: SLP Short Term Goal 1 (Week 1): Patient will initiate functional tasks with Mod A verbal cues.  SLP Short Term Goal 2 (Week 1): Patient will orient to place, time and situation with Mod A multimodal cues.  SLP Short Term Goal 3 (Week 1): Patient will demonstrate sustained attention to functional tasks for 2 minutes with Max A multimodal cues.  SLP Short Term Goal 4 (Week 1): Patient will consume current diet with minimal overt s/s of aspiration with Min A verbal cues for use of swallowing strategies.  SLP Short Term Goal 5 (Week 1): Patient will consume trials of Dys. 3 textures with efficient mastication and complete oral clearing without overt s/s of aspiration over 2 sessions prior to upgrade.  SLP Short Term Goal 6 (Week 1): Patient will verbally respond to questions in 75% of opportunities with Mod A verbal cues.   Skilled Therapeutic Interventions: Skilled treatment session focused on cognitive goals. SLP facilitated session by providing more than a reasonable amount of time and Max A multimodal cues to initiate any task. Patient also demonstrated fleeting attention to tasks for ~30 second intervals which also impacted her initiation. Patient appeared lethargic and required Mod verbal cues for arousal. Patient perseverative on being cold throughout session. Patient with minimal verbal expression with a hoarse vocal quality and declined trials of upgraded textures. Patient only initiated brushing her hair in which she required Max verbal cues for thoroughness. Patient left upright in wheelchair with all needs within reach and quick release belt in place. Continue with current plan of care.       Function:   Cognition Comprehension  Comprehension assist level: Understands basic 25 - 49% of the time/ requires cueing 50 - 75% of the time  Expression   Expression assist level: Expresses basic 25 - 49% of the time/requires cueing 50 - 75% of the time. Uses single words/gestures.  Social Interaction Social Interaction assist level: Interacts appropriately 25 - 49% of time - Needs frequent redirection.  Problem Solving Problem solving assist level: Solves basic less than 25% of the time - needs direction nearly all the time or does not effectively solve problems and may need a restraint for safety  Memory Memory assist level: Recognizes or recalls less than 25% of the time/requires cueing greater than 75% of the time    Pain No/Denies Pain   Therapy/Group: Individual Therapy  Blaire Palomino 08/09/2017, 4:11 PM

## 2017-08-09 NOTE — Progress Notes (Signed)
Occupational Therapy Session Note  Patient Details  Name: [name] MRN: [id number] Date of Birth: 1945-12-14  Today's Date: 08/09/2017 OT Individual Time: 0703-0800 OT Individual Time Calculation (min): 57 min    Short Term Goals: Week 1:  OT Short Term Goal 1 (Week 1): Pt will perform toilet transfer with max A stand pivot transfer.  OT Short Term Goal 2 (Week 1): Pt will initiate UB bathing with increased time and mod multimodal cues.  OT Short Term Goal 3 (Week 1): Pt will perform UB dressing with mod A in order to decrease level of assist with self care.  Skilled Therapeutic Interventions/Progress Updates:    1:1. Focus of session on initiation, sequencing and dressing. Pt supine to sitting EOB with min A for trunk elevation. Pt sits EOB with supervision to don bra, shirt and pants. Pt able to orient bra and shirt with significantly increased time and min VC for use tags to locate back of garment. Pt able to thread BUE into bra straps and bring up to shoulders. OT fastens bar in back and assists pulling bra over chest per pt request. Pt able to orient shirt however d/t decreased attention attempts to thread head and loses focus and unable to find head hole. With step by step cueing pt able to thread BUE into shirt and requires HOH A to bring head hole over head. Pt able to pull shirt down front with VC for persistance of task. OT thread BLE into pants and pt able to pull pants over feet with VC for continuation. Pt sit tos tand with walker and lifting A as OT advances pants past hips. Exited sesison with pt seated in bed, call light in reach, exit alam on, 4 rails up per pt request, and all needs met Therapy Documentation Precautions:  Precautions Precautions: Fall Precaution Comments: fearful of falling Restrictions Weight Bearing Restrictions: No General:   See Function Navigator for Current Functional Status.   Therapy/Group: Individual Therapy  Tonny Branch 08/09/2017, 7:57 AM

## 2017-08-09 NOTE — Progress Notes (Signed)
Patient with intermittent bouts of lethargy, word finding difficulty inattention.  Spoke at length with speech therapy.  Will check CT of head for any neurological event

## 2017-08-09 NOTE — Progress Notes (Signed)
Physical Therapy Note  Patient Details  Name: [name] MRN: [id number] Date of Birth: 1945/11/13 Today's Date: 08/09/2017    Time: 830-923 53 minutes  1:1 Pt c/o pain in Lt knee after standing, ice applied after session.  Pt performs bed mobility with min A, increased time and multimodal cues.  Pt performs stand pivot transfers throughout session with mod A with max cuing for sequencing and attention.  Pt performs gait with +2 HHA 10', 5' with cuing for attention, posture and initiation.  Pt with decreased wt shifts, decreased step length. PT applied kinesiotape to pts Lt UE to reduce swelling.  Pt performed attention tasks in quiet environment naming friends in a picture and attempting basic writing tasks, pt requires max cues for attention at end of session due to fatigue. Pt left in w/c with alarm set, quick release belt donned, needs at hand.   Chrishauna Mee 08/09/2017, 9:25 AM

## 2017-08-10 ENCOUNTER — Inpatient Hospital Stay (HOSPITAL_COMMUNITY): Payer: Medicare Other | Admitting: Physical Therapy

## 2017-08-10 ENCOUNTER — Inpatient Hospital Stay (HOSPITAL_COMMUNITY): Payer: Medicare Other

## 2017-08-10 DIAGNOSIS — R1312 Dysphagia, oropharyngeal phase: Secondary | ICD-10-CM

## 2017-08-10 LAB — GLUCOSE, CAPILLARY
Glucose-Capillary: 96 mg/dL (ref 65–99)
Glucose-Capillary: 195 mg/dL — ABNORMAL HIGH (ref 65–99)
Glucose-Capillary: 100 mg/dL — ABNORMAL HIGH (ref 65–99)

## 2017-08-10 LAB — URINE CULTURE

## 2017-08-10 NOTE — Progress Notes (Signed)
Speech Language Pathology Daily Session Note  Patient Details  Name: [name] MRN: [id number] Date of Birth: April 08, 1945  Today's Date: 08/10/2017 SLP Individual Time: 1215-1258 SLP Individual Time Calculation (min): 43 min  Short Term Goals: Week 1: SLP Short Term Goal 1 (Week 1): Patient will initiate functional tasks with Mod A verbal cues.  SLP Short Term Goal 2 (Week 1): Patient will orient to place, time and situation with Mod A multimodal cues.  SLP Short Term Goal 3 (Week 1): Patient will demonstrate sustained attention to functional tasks for 2 minutes with Max A multimodal cues.  SLP Short Term Goal 4 (Week 1): Patient will consume current diet with minimal overt s/s of aspiration with Min A verbal cues for use of swallowing strategies.  SLP Short Term Goal 5 (Week 1): Patient will consume trials of Dys. 3 textures with efficient mastication and complete oral clearing without overt s/s of aspiration over 2 sessions prior to upgrade.  SLP Short Term Goal 6 (Week 1): Patient will verbally respond to questions in 75% of opportunities with Mod A verbal cues.   Skilled Therapeutic Interventions:Skilled ST services focused on swallow and cognitive skills. SLP facilitated task initiate during functional problem solving task, identifying the problem among picture cards and then during PO consumption of dys 2 lunch tray, requiring max A verbal cues. Pt demonstrated prolonged mastication due to inattention to bolus and minimal PO intake despite max A verbal cues from SLP and granddaughter. SLP explained the importance of PO intake and lack of intake could lead to alternative means of intake, pt stated understanding, however not likely due to cognitive impairments. Pt demonstrated sustained attention to tasks in 30 minute intervals required redirection and response to basic questions in 50% of opportunities with max A verbal cues. Pt required max A verbal cues for orientation . Pt was left in room  with call bell within reach. Reccomend to continue skilled ST services.      Function:  Eating Eating   Modified Consistency Diet: Yes Eating Assist Level: Supervision or verbal cues;Set up assist for;Helper scoops food on utensil   Eating Set Up Assist For: Opening containers Helper Scoops Food on Utensil: Every scoop     Cognition Comprehension Comprehension assist level: Understands basic 50 - 74% of the time/ requires cueing 25 - 49% of the time  Expression   Expression assist level: Expresses basic 25 - 49% of the time/requires cueing 50 - 75% of the time. Uses single words/gestures.  Social Interaction Social Interaction assist level: Interacts appropriately 50 - 74% of the time - May be physically or verbally inappropriate.  Problem Solving Problem solving assist level: Solves basic 25 - 49% of the time - needs direction more than half the time to initiate, plan or complete simple activities  Memory Memory assist level: Recognizes or recalls 25 - 49% of the time/requires cueing 50 - 75% of the time    Pain Pain Assessment Pain Score: 0-No pain  Therapy/Group: Individual Therapy  Pearley Baranek  [facility] 08/10/2017, 2:54 PM

## 2017-08-10 NOTE — Plan of Care (Signed)
  Problem: RH KNOWLEDGE DEFICIT GENERAL Goal: RH STG INCREASE KNOWLEDGE OF SELF CARE AFTER HOSPITALIZATION Description Able to verbalize knowledge of disease processes and preventative measures of complications of hypertension, CAD, DM  Outcome: Not Progressing  Family member deficit of knowledge disease process.

## 2017-08-10 NOTE — Progress Notes (Signed)
[facility] PHYSICAL MEDICINE & REHABILITATION     PROGRESS NOTE    Subjective/Complaints: Patient remains confused, according to her granddaughter this has been going on since extubation.  We discussed the CT results.  No new infarcts there was evidence of old infarct on the left side in the basal ganglia area Discussed need for re-collection of urine culture Patient is oriented to person, thinks she is at [facility] cannot think of the name of [facility], not oriented to time ROS: Limited due to cognitive/behavioral, but appears to deny CP, SOB, N/VD   Objective:  Ct Head Wo Contrast  Result Date: 08/09/2017 CLINICAL DATA:  Confusion.  History of breast carcinoma EXAM: CT HEAD WITHOUT CONTRAST TECHNIQUE: Contiguous axial images were obtained from the base of the skull through the vertex without intravenous contrast. COMPARISON:  May 25, 2003 FINDINGS: Brain: The ventricles are normal in size and configuration. There is a prominent cavum septum pellucidum, an anatomic variant. There is no intracranial mass, hemorrhage, extra-axial fluid collection, or midline shift. There is evidence of a prior small infarct involving a portion of the anterior limb of the left internal capsule and adjacent anterior lentiform nucleus on the left. There is a prior small infarct in the anterior left centrum semiovale. Elsewhere, there is minimal periventricular small vessel disease. No acute infarct is evident. Mild basal ganglia calcification is felt to be physiologic. Vascular: No hyperdense vessel. There is calcification in each carotid siphon region. Skull: The bony calvarium appears intact. Sinuses/Orbits: There is mucosal thickening in several ethmoid air cells bilaterally. Other visualized paranasal sinuses are clear. Visualized orbits appear symmetric bilaterally. Other: There is opacification in several inferior mastoid air cells bilaterally. Mild debris in each external auditory canal. IMPRESSION:  Prior infarcts in the left basal ganglia and anterior left centrum semiovale. Slight periventricular small vessel disease. No acute infarct. No mass or hemorrhage. Foci of arterial vascular calcification noted. Mucosal thickening in several ethmoid air cells. Opacification in several inferior mastoid air cells bilaterally noted. There is probable cerumen in each external auditory canal. Electronically Signed   By: Lowella Grip III M.D.   On: 08/09/2017 17:26   No results for input(s): WBC, HGB, HCT, PLT in the last 72 hours. No results for input(s): NA, K, CL, GLUCOSE, BUN, CREATININE, CALCIUM in the last 72 hours.  Invalid input(s): CO CBG (last 3)  Recent Labs    08/09/17 2231 08/10/17 0701 08/10/17 1139  GLUCAP 111* 96 195*    Wt Readings from Last 3 Encounters:  08/10/17 77.2 kg (170 lb 3.1 oz)  08/06/17 81.7 kg (180 lb 1.9 oz)  06/19/17 87.1 kg (192 lb 1.6 oz)     Intake/Output Summary (Last 24 hours) at 08/10/2017 1353 Last data filed at 08/10/2017 0700 Gross per 24 hour  Intake 120 ml  Output -  Net 120 ml    Vital Signs: Blood pressure (!) 166/65, pulse 91, temperature 98.4 F (36.9 C), temperature source Oral, resp. rate 18, height 5' 4"  (1.626 m), weight 77.2 kg (170 lb 3.1 oz), SpO2 98 %. Physical Exam:  Constitutional: No distress . Vital signs reviewed. Obese. HENT: Normocephalic, atraumatic Eyes: EOMI. No discharge.  Cardiovascular: RRR. No JVD    Respiratory: CTA Bilaterally. Normal effort    GI: BS +, non-distended    Musculoskeletal: Left hand with edema and tenderness at prior IV site.   Neurological:  Delayed processing.   Motor: RUE: 4-/5 proximal to distal LUE: shoulder abduction, elbow flex/ext  4/5, hand grip 4-/5 (pain inhibition) B/l LE: HF,2/5, KE 2+/5, ADF 4-/5 Psychiatric: Flat  Assessment/Plan: 1. Functional deficits secondary to debility/encephalopathy which require 3+ hours per day of interdisciplinary therapy in a comprehensive  inpatient rehab setting. Physiatrist is providing close team supervision and 24 hour management of active medical problems listed below. Physiatrist and rehab team continue to assess barriers to discharge/monitor patient progress toward functional and medical goals.  Function:  Bathing Bathing position   Position: Sitting EOB  Bathing parts Body parts bathed by patient: Chest, Right arm, Left arm, Abdomen, Front perineal area, Left upper leg, Right upper leg Body parts bathed by helper: Buttocks, Back  Bathing assist Assist Level: Touching or steadying assistance(Pt > 75%)      Upper Body Dressing/Undressing Upper body dressing   What is the patient wearing?: Hospital gown                Upper body assist Assist Level: Touching or steadying assistance(Pt > 75%)      Lower Body Dressing/Undressing Lower body dressing   What is the patient wearing?: Non-skid slipper socks, Ted Hose           Non-skid slipper socks- Performed by helper: Don/doff right sock, Don/doff left sock               TED Hose - Performed by helper: Don/doff right TED hose, Don/doff left TED hose  Lower body assist Assist for lower body dressing: (total )      Toileting Toileting Toileting activity did not occur: No continent bowel/bladder event        Toileting assist     Transfers Chair/bed transfer   Chair/bed transfer method: Stand pivot Chair/bed transfer assist level: Moderate assist (Pt 50 - 74%/lift or lower) Chair/bed transfer assistive device: Armrests     Locomotion Ambulation Ambulation activity did not occur: Safety/medical concerns         Wheelchair Wheelchair activity did not occur: Safety/medical concerns Type: Manual      Cognition Comprehension Comprehension assist level: Understands basic 50 - 74% of the time/ requires cueing 25 - 49% of the time  Expression Expression assist level: Expresses basic 25 - 49% of the time/requires cueing 50 - 75% of the time.  Uses single words/gestures.  Social Interaction Social Interaction assist level: Interacts appropriately 50 - 74% of the time - May be physically or verbally inappropriate.  Problem Solving Problem solving assist level: Solves basic 25 - 49% of the time - needs direction more than half the time to initiate, plan or complete simple activities  Memory Memory assist level: Recognizes or recalls 25 - 49% of the time/requires cueing 50 - 75% of the time   Medical Problem List and Plan:  1. Debility and encephalopathy secondary to ARDS/cardiogenic shock/PEA arrest /CAP/multi-medical-extubated 07/31/2017   Cont CIR, PT OT speech 2. DVT Prophylaxis/Anticoagulation: Subcutaneous heparin. Monitor for any bleeding episodes  3. Pain Management: Tylenol as needed  4. Mood/delirium/insomnia: Seroquel   42m QHS at 8:30pm   ?continue Atarax 10 mg nightly----may be actually increasing confusion   Continue sleep-wake  chart   Bladder may be affecting sleep also. 5. Neuropsych: This patient is not capable of making decisions on her own behalf.  6. Skin/Wound Care: Routine skin checks   -Left hand where IV infiltrated still with swelling and tenderness.  No erythema per granddaughter this is much better than it had been a few days ago  -Started Keflex yesterday will hold given need  for re-collection of urine 7. Fluids/Electrolytes/Nutrition: encourage PO  8. CAP/fevers. 8-day course of Merrem completed, afebrile at present  9. Dysphagia. Dysphagia #2 thin liquids. Follow-up speech therapy   Encourage PO.  10. Chronic systolic congestive heart failure. Monitor for any signs of fluid overload  Filed Weights   08/07/17 0511 08/09/17 0401 08/10/17 0500  Weight: 80 kg (176 lb 5.9 oz) 80.1 kg (176 lb 9.4 oz) 77.2 kg (170 lb 3.1 oz)  11. History of CAD. Continue aspirin and Plavix  12. CKD stage II.   Cr 1.14 on 5/22  Cont to monitor 13. Hypertension. Norvasc 10 mg daily, Cozaar 100 mg daily.   Coreg 12.5 mg  twice daily, increased to 25 on 5/24  Hypertensive crisis overnight 14. Diabetes mellitus with peripheral neuropathy. Lantus insulin 15 units nightly.   Labile on 5/24 15. History of breast cancer. Continue arimidex  16.  Urinary frequency/odor:   Urinalysis equivocal, U culture pending  Started empiric Keflex, hold pending repeat culture 17.  Confusion disorientation, CT negative for new event, cath for urine culture given positive UA and multiple species on "clean catch"  LOS (Days) 4 A FACE TO FACE EVALUATION WAS PERFORMED  Charlett Blake, MD 08/10/2017 1:53 PM

## 2017-08-10 NOTE — Progress Notes (Signed)
Occupational Therapy Session Note  Patient Details  Name: [name] MRN: [id number] Date of Birth: [birth year]  Today's Date: 08/10/2017 OT Individual Time: 0730-0828 Session 2: 8341-9622 OT Individual Time Calculation (min): 58 min Session 2: 35 min   Short Term Goals: Week 1:  OT Short Term Goal 1 (Week 1): Pt will perform toilet transfer with max A stand pivot transfer.  OT Short Term Goal 2 (Week 1): Pt will initiate UB bathing with increased time and mod multimodal cues.  OT Short Term Goal 3 (Week 1): Pt will perform UB dressing with mod A in order to decrease level of assist with self care.  Skilled Therapeutic Interventions/Progress Updates:    Pt received supine in bed agreeable to therapy. Session focused on sequencing, initiation of tasks, ADL transfers, and b/d retraining. Increased time provided throughout session for pt to problem solve and initiate tasks on her own. Pt required vc/tactile cues to initiate doffing of gown seated EOB. Vc provided for thoroughness of UB bathing, with pt following 50% of commands. Unclear at this time how much pt is receptively understanding. Pt completed 3x sit to stand transfer with mod A. Pt able to remain standing with 1 UE support on RW to perform peri-hygiene with min A stabilization. Vc required throughout session for termination of tasks, with pt perseverating on several aspects throughout. Pt attempted to begin eating breakfast EOB, requiring vc for encouragement to scoop food and for pacing. Pt left supine in bed with all needs met and bed alarm set.   Session 2: Pt received supine in bed agreeable to therapy. Session focused on ADL transfers and task initiation. Pt required increased time for initation of all tasks this session. Pt transitioned to EOB with mod A and heavy vc/tactile cues for UE/LE placement. Pt completed sit to stand transfer with mod A and required multimodal cueing to sequence transfer to w/c. Oral hygiene performed at  sink with set up and vc for initiation/termination. Pt requested to use bathroom, requiring mod A to complete stand pivot transfer to [facility]. With mod A standing balance stabilization pt able to perform anterior peri-hygiene. Pt transferred back to [facility] with mod A and heavy multimodal cueing for sequencing. Pt left in w/c with chair alarm set and family present.   Therapy Documentation Precautions:  Precautions Precautions: Fall Precaution Comments: fearful of falling Restrictions Weight Bearing Restrictions: No    Vital Signs: Therapy Vitals Temp: 98.4 F (36.9 C) Temp Source: Oral Pulse Rate: 91 Resp: 18 BP: (!) 166/65 Patient Position (if appropriate): Lying Oxygen Therapy SpO2: 98 % O2 Device: Room Air Pain: Pain Assessment Pain Scale: 0-10 Pain Score: 0-No pain  See Function Navigator for Current Functional Status.   Therapy/Group: Individual Therapy  Curtis Sites 08/10/2017, 8:28 AM

## 2017-08-10 NOTE — Progress Notes (Addendum)
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 Daughter at the time stated, "I am a TEFL teacher noted, "if you are a [facility] Employee you understand the policy regarding giving results. If you would like you can speak with my charge nurse, unfortunately I can not."

## 2017-08-10 NOTE — Progress Notes (Signed)
Physical Therapy Session Note  Patient Details  Name: [name] MRN: [id number] Date of Birth: 1946-02-03  Today's Date: 08/10/2017 PT Individual Time: 5400-8676 PT Individual Time Calculation (min): 53 min   Short Term Goals: Week 1:  PT Short Term Goal 1 (Week 1): Pt will complete bed mobility with mod A consistently PT Short Term Goal 2 (Week 1): Pt will tolerate sitting up in chair x 1 hour PT Short Term Goal 3 (Week 1): Pt will perform least restricitive transfer with max A consistently  Skilled Therapeutic Interventions/Progress Updates:   Pt in supine and requesting to toilet, denies pain. Session focused on sequencing and initiation of functional tasks including self-care tasks and performing NuStep.Transferred to EOB w/ mod assist and ambulated to/from toilet w/ min assist, 15' each way using RW. Max verbal and tactile cues for sequencing, initiation, safety, RW management, and technique. Required multiple attempts to stand each time 2/2 impaired cognition, ultimately requiring only mod assist. Total assist for LE garment management and verbal cues for pericare. Stood at sink w/ min guard to wash hands w/ min verbal cues for technique and sequencing of task. Total assist w/c transport to/from day room for time management. Performed NuStep 5 min total at level 3 for LE strengthening and reciprocal movement pattern. Max cues to initiate and cues to attend to task every 30 seconds or so. Pt unable to maintain neutral upright posture on NuStep despite max cues and manual assist to adjust posture. Returned to room and ended session in w/c and in care of family, all needs met. Chair alarm activated. Increased time for all functional mobility 2/2 cognition.   Therapy Documentation Precautions:  Precautions Precautions: Fall Precaution Comments: fearful of falling Restrictions Weight Bearing Restrictions: No Vital Signs: Therapy Vitals Temp: 98.4 F (36.9 C) Temp Source: Oral Pulse  Rate: 86 Resp: 17 BP: (!) 154/72 Patient Position (if appropriate): Lying Oxygen Therapy SpO2: 100 % O2 Device: Room Air Pain: Pain Assessment Pain Score: 0-No pain  See Function Navigator for Current Functional Status.   Therapy/Group: Individual Therapy  Milli Woolridge K Arnette 08/10/2017, 4:54 PM

## 2017-08-10 NOTE — Progress Notes (Signed)
Using nursing judgment administered Seroquel and vistaril at the same time (see MAR) and after fingerstick.

## 2017-08-10 NOTE — Progress Notes (Addendum)
Pt slept well throughout the night. Family member at bedside. See sleep chart flow sheet computer.

## 2017-08-11 LAB — GLUCOSE, CAPILLARY
GLUCOSE-CAPILLARY: 89 mg/dL (ref 65–99)
GLUCOSE-CAPILLARY: 127 mg/dL — AB (ref 65–99)
Glucose-Capillary: 117 mg/dL — ABNORMAL HIGH (ref 65–99)
Glucose-Capillary: 191 mg/dL — ABNORMAL HIGH (ref 65–99)

## 2017-08-12 ENCOUNTER — Inpatient Hospital Stay (HOSPITAL_COMMUNITY): Payer: Medicare Other | Admitting: Physical Therapy

## 2017-08-12 ENCOUNTER — Inpatient Hospital Stay (HOSPITAL_COMMUNITY): Payer: Medicare Other

## 2017-08-12 ENCOUNTER — Inpatient Hospital Stay (HOSPITAL_COMMUNITY): Payer: Medicare Other | Admitting: Speech Pathology

## 2017-08-12 DIAGNOSIS — I10 Essential (primary) hypertension: Secondary | ICD-10-CM

## 2017-08-12 DIAGNOSIS — D72829 Elevated white blood cell count, unspecified: Secondary | ICD-10-CM

## 2017-08-12 DIAGNOSIS — G479 Sleep disorder, unspecified: Secondary | ICD-10-CM

## 2017-08-12 DIAGNOSIS — I152 Hypertension secondary to endocrine disorders: Secondary | ICD-10-CM

## 2017-08-12 DIAGNOSIS — D62 Acute posthemorrhagic anemia: Secondary | ICD-10-CM

## 2017-08-12 LAB — GLUCOSE, CAPILLARY
GLUCOSE-CAPILLARY: 107 mg/dL — AB (ref 65–99)
Glucose-Capillary: 109 mg/dL — ABNORMAL HIGH (ref 65–99)
Glucose-Capillary: 187 mg/dL — ABNORMAL HIGH (ref 65–99)
Glucose-Capillary: 95 mg/dL (ref 65–99)

## 2017-08-12 MED ORDER — MELATONIN 3 MG PO TABS
1.5000 mg | ORAL_TABLET | Freq: Every day | ORAL | Status: DC
  Administered 2017-08-12 – 2017-08-21 (×10): 1.5 mg via ORAL
  Filled 2017-08-12 (×10): qty 0.5

## 2017-08-12 MED ORDER — NON FORMULARY: 1.5000 mg | Freq: Every day | Status: DC

## 2017-08-12 MED ORDER — HYDRALAZINE HCL 10 MG PO TABS
10.0000 mg | ORAL_TABLET | Freq: Three times a day (TID) | ORAL | Status: DC
  Administered 2017-08-12 – 2017-08-22 (×31): 10 mg via ORAL
  Filled 2017-08-12 (×30): qty 1

## 2017-08-12 NOTE — Care Management (Addendum)
[facility] Statement of Services  Patient Name:  [name]  Date:  08/09/2017  Welcome to the [facility].  Our goal is to provide you with an individualized program based on your diagnosis and situation, designed to meet your specific needs.  With this comprehensive rehabilitation program, you will be expected to participate in at least 3 hours of rehabilitation therapies Monday-Friday, with modified therapy programming on the weekends.  Your rehabilitation program will include the following services:  Physical Therapy (PT), Occupational Therapy (OT), Speech Therapy (ST), 24 hour per day rehabilitation nursing, Therapeutic Recreaction (TR), Neuropsychology, Case Management (Social Worker), Rehabilitation Medicine, Nutrition Services and Pharmacy Services  Weekly team conferences will be held on Tuesdays to discuss your progress.  Your Social Worker will talk with you frequently to get your input and to update you on team discussions.  Team conferences with you and your family in attendance may also be held.  Expected length of stay: 21-24 days  Overall anticipated outcome: minimal to moderate assistance  Depending on your progress and recovery, your program may change. Your Social Worker will coordinate services and will keep you informed of any changes. Your Social Worker's name and contact numbers are listed  below.  The following services may also be recommended but are not provided by the [facility] will be made to provide these services after discharge if needed.  Arrangements include referral to agencies that provide these services.  Your insurance has been verified to be:  [facility] Medicare;  [place] [facility] Your primary doctor is:  (planning to change primary MD to Dr.  Delfina Redwood)  Pertinent information will be shared with your doctor and your insurance company.  Social Worker:  [place], [place] or ([phone]   Information discussed with and copy given to patient by: Lennart Pall, 08/09/2017, 4:30 PM

## 2017-08-12 NOTE — Progress Notes (Signed)
Occupational Therapy Session Note  Patient Details  Name: [name] MRN: [id number] Date of Birth: 01/18/1946  Today's Date: 08/12/2017 OT Individual Time: 4270-6237 OT Individual Time Calculation (min): 54 min    Short Term Goals: Week 1:  OT Short Term Goal 1 (Week 1): Pt will perform toilet transfer with max A stand pivot transfer.  OT Short Term Goal 2 (Week 1): Pt will initiate UB bathing with increased time and mod multimodal cues.  OT Short Term Goal 3 (Week 1): Pt will perform UB dressing with mod A in order to decrease level of assist with self care.  Skilled Therapeutic Interventions/Progress Updates:    Pt sitting in w/c agreeable to therapy. Pt propelled w/c 25 ft x2 trials with increased time, vc for technique, and manual facilitation for UE placement, requiring mod A overall to maintain straight trajectory. Edema observed in pt's L hand. Pt instructed in muscle pumping as a HEP. Pt tolerated manual traction/massage of L hand to reduce edema. Pt then completed activity with clothespins, requiring vc/manual facilitation for in hand manipulation and pronation/supination at the wrist. Kinesiotape applied to pt's L dorsum of the hand to promote reduced edema and facilitate fluid return to body. Pt returned to room and left in w/c with QRB donned, chair alarm activated, and friend present.   Therapy Documentation Precautions:  Precautions Precautions: Fall Precaution Comments: fearful of falling Restrictions Weight Bearing Restrictions: No Therapy Vitals Temp: 98 F (36.7 C) Temp Source: Oral Pulse Rate: 74 Resp: 18 BP: 134/60 Patient Position (if appropriate): Sitting Oxygen Therapy SpO2: 98 % O2 Device: Room Air Pain: Pain Assessment Pain Scale: 0-10 Pain Score: 0-No pain  See Function Navigator for Current Functional Status.   Therapy/Group: Individual Therapy  Curtis Sites 08/12/2017, 3:05 PM

## 2017-08-12 NOTE — Progress Notes (Signed)
Physical Therapy Session Note  Patient Details  Name: [name] MRN: [id number] Date of Birth: July 08, 1945  Today's Date: 08/12/2017 PT Individual Time: 6144-3154 PT Individual Time Calculation (min): 25 min   Short Term Goals: Week 1:  PT Short Term Goal 1 (Week 1): Pt will complete bed mobility with mod A consistently PT Short Term Goal 2 (Week 1): Pt will tolerate sitting up in chair x 1 hour PT Short Term Goal 3 (Week 1): Pt will perform least restricitive transfer with max A consistently  Skilled Therapeutic Interventions/Progress Updates:    no c/o pain, but reports having just gotten back to bed, fatigued, and declining out of bed therapy.  Agreeable to cognitive task at bed level with encouragement.  Pt requires max cues to attend to task initially in quiet environment, however once repositioned able to attend to simple peg board task with min verbal cues for accuracy (blocked colors only, was not able to recreate patterns).  Max attention time 2 minutes.  Pt left positioned to comfort with call bell in reach and needs met.   Therapy Documentation Precautions:  Precautions Precautions: Fall Precaution Comments: fearful of falling Restrictions Weight Bearing Restrictions: No   See Function Navigator for Current Functional Status.   Therapy/Group: Individual Therapy  Michel Santee 08/12/2017, 5:06 PM

## 2017-08-12 NOTE — Progress Notes (Signed)
Speech Language Pathology Daily Session Note  Patient Details  Name: [name] MRN: [id number] Date of Birth: May 03, 1945  Today's Date: 08/12/2017 SLP Individual Time: 1000-1055 SLP Individual Time Calculation (min): 55 min  Short Term Goals: Week 1: SLP Short Term Goal 1 (Week 1): Patient will initiate functional tasks with Mod A verbal cues.  SLP Short Term Goal 2 (Week 1): Patient will orient to place, time and situation with Mod A multimodal cues.  SLP Short Term Goal 3 (Week 1): Patient will demonstrate sustained attention to functional tasks for 2 minutes with Max A multimodal cues.  SLP Short Term Goal 4 (Week 1): Patient will consume current diet with minimal overt s/s of aspiration with Min A verbal cues for use of swallowing strategies.  SLP Short Term Goal 5 (Week 1): Patient will consume trials of Dys. 3 textures with efficient mastication and complete oral clearing without overt s/s of aspiration over 2 sessions prior to upgrade.  SLP Short Term Goal 6 (Week 1): Patient will verbally respond to questions in 75% of opportunities with Mod A verbal cues.   Skilled Therapeutic Interventions: Skilled treatment session focused on cognitive goals. SLP facilitated session by providing music of her choice to facilitate attention, engagement and initiation throughout session. Patient sorted coins from a field of 4 with 100% accuracy and extra time but then required Max A multimodal cues to make specific amounts of change due to impaired attention/initiation. Patient demonstrated focused attention for ~30 second intervals.  Patient independently requested to use the bathroom and independently initiated the transfer to the commode and clothing management. Patient was continent of bowel and performed all self-care tasks with Min A verbal cues for sequencing. SLP also initiated a memory notebook to facilitate carryover of daily information. Patient's family present and verbalized understanding.  Patient left upright in wheelchair with all needs within reach and family present. Continue with current plan of care.      Function:   Cognition Comprehension Comprehension assist level: Understands basic 75 - 89% of the time/ requires cueing 10 - 24% of the time  Expression   Expression assist level: Expresses basic 75 - 89% of the time/requires cueing 10 - 24% of the time. Needs helper to occlude trach/needs to repeat words.  Social Interaction Social Interaction assist level: Interacts appropriately 75 - 89% of the time - Needs redirection for appropriate language or to initiate interaction.  Problem Solving Problem solving assist level: Solves basic 25 - 49% of the time - needs direction more than half the time to initiate, plan or complete simple activities  Memory Memory assist level: Recognizes or recalls 25 - 49% of the time/requires cueing 50 - 75% of the time    Pain No/Denies Pain   Therapy/Group: Individual Therapy  Tansy Lorek 08/12/2017, 2:58 PM

## 2017-08-12 NOTE — Progress Notes (Signed)
Physical Therapy Note  Patient Details  Name: [name] MRN: [id number] Date of Birth: 1945/04/24 Today's Date: 08/12/2017    Time: 730-825 55 minutes  1:1 No c/o pain.  Pt agreeable to therapy, stating need to use the restroom.  Pt performs bed mobility with min A, increased time.  Pt requires max cues for attention and initiation for transfers initially.  Pt min A for sit to stand.  Gait to bathroom with RW and min/steadying assist.  Pt performs upper and lower body dressing from toilet with max A for upper body, mod A for lower body, min A to stand to pull up pants.  Pt performs gait in room and and hallway up to 3' with RW and steayding assist.  Attention and problem solving with grooming tasks of brushing hair and brushing teeth. Pt requires min/mod cuing to complete.  Pt left in room with alarm set,needs at hand, visitor present.   Corwin Kuiken 08/12/2017, 8:31 AM

## 2017-08-12 NOTE — Progress Notes (Signed)
[place]IC[facility] & REHABILITATION     PROGRESS NOTE    Subjective/Complaints: Patient seen lying bed this morning. Family at bedside. She states she slept well overnight. She denies complaints.  ROS: Limited due to cognitive/behavioral, but appears to deny CP, SOB, N/VD   Objective:  No results found. No results for input(s): WBC, HGB, HCT, PLT in the last 72 hours. No results for input(s): NA, K, CL, GLUCOSE, BUN, CREATININE, CALCIUM in the last 72 hours.  Invalid input(s): CO CBG (last 3)  Recent Labs    08/11/17 1632 08/11/17 2103 08/12/17 0622  GLUCAP 191* 127* 109*    Wt Readings from Last 3 Encounters:  08/12/17 78.1 kg (172 lb 2.9 oz)  08/06/17 81.7 kg (180 lb 1.9 oz)  06/19/17 87.1 kg (192 lb 1.6 oz)     Intake/Output Summary (Last 24 hours) at 08/12/2017 0903 Last data filed at 08/12/2017 0839 Gross per 24 hour  Intake 840 ml  Output -  Net 840 ml    Vital Signs: Blood pressure (!) 197/66, pulse 85, temperature 99 F (37.2 C), temperature source Oral, resp. rate 18, height 5' 4"  (1.626 m), weight 78.1 kg (172 lb 2.9 oz), SpO2 98 %. Physical Exam:  Constitutional: No distress . Vital signs reviewed. Obese. HENT: Normocephalic, atraumatic Eyes: EOMI. No discharge.  Cardiovascular: RRR. No JVD    Respiratory: CTA Bilaterally. Normal effort    GI: BS +, non-distended    Musculoskeletal: Left hand with edema and tenderness at prior IV site.   Neurological:  Delayed processing.   Motor: RUE: 4-/5 proximal to distal (stable) LUE: shoulder abduction, elbow flex/ext 4/5, hand grip 4-/5 (pain inhibition, stable) B/l LE: HF 2+/5, KE 3-/5, ADF 4-/5 Psychiatric: Flat  Assessment/Plan: 1. Functional deficits secondary to debility/encephalopathy which require 3+ hours per day of interdisciplinary therapy in a comprehensive inpatient rehab setting. Physiatrist is providing close team supervision and 24 hour management of active medical problems listed  below. Physiatrist and rehab team continue to assess barriers to discharge/monitor patient progress toward functional and medical goals.  Function:  Bathing Bathing position   Position: Sitting EOB  Bathing parts Body parts bathed by patient: Chest, Right arm, Left arm, Abdomen, Front perineal area, Left upper leg, Right upper leg Body parts bathed by helper: Buttocks, Back  Bathing assist Assist Level: Touching or steadying assistance(Pt > 75%)      Upper Body Dressing/Undressing Upper body dressing   What is the patient wearing?: Hospital gown                Upper body assist Assist Level: Touching or steadying assistance(Pt > 75%)      Lower Body Dressing/Undressing Lower body dressing   What is the patient wearing?: Non-skid slipper socks, Ted Hose           Non-skid slipper socks- Performed by helper: Don/doff right sock, Don/doff left sock               TED Hose - Performed by helper: Don/doff right TED hose, Don/doff left TED hose  Lower body assist Assist for lower body dressing: (total )      Toileting Toileting Toileting activity did not occur: No continent bowel/bladder event   Toileting steps completed by helper: Adjust clothing prior to toileting, Performs perineal hygiene, Adjust clothing after toileting Toileting Assistive Devices: Grab bar or rail  Toileting assist Assist level: Two helpers   Transfers Chair/bed transfer   Chair/bed transfer method: Stand pivot Chair/bed  transfer assist level: Touching or steadying assistance (Pt > 75%) Chair/bed transfer assistive device: Armrests     Locomotion Ambulation Ambulation activity did not occur: Safety/medical concerns   Max distance: 15' Assist level: Touching or steadying assistance (Pt > 75%)   Wheelchair Wheelchair activity did not occur: Safety/medical concerns Type: Manual      Cognition Comprehension Comprehension assist level: Understands basic 75 - 89% of the time/ requires  cueing 10 - 24% of the time  Expression Expression assist level: Expresses basic 75 - 89% of the time/requires cueing 10 - 24% of the time. Needs helper to occlude trach/needs to repeat words.  Social Interaction Social Interaction assist level: Interacts appropriately 75 - 89% of the time - Needs redirection for appropriate language or to initiate interaction.  Problem Solving Problem solving assist level: Solves basic 25 - 49% of the time - needs direction more than half the time to initiate, plan or complete simple activities  Memory Memory assist level: Recognizes or recalls 25 - 49% of the time/requires cueing 50 - 75% of the time   Medical Problem List and Plan:  1. Debility and encephalopathy secondary to ARDS/cardiogenic shock/PEA arrest /CAP/multi-medical-extubated 07/31/2017   Cont CIR 2. DVT Prophylaxis/Anticoagulation: Subcutaneous heparin. Monitor for any bleeding episodes  3. Pain Management: Tylenol as needed  4. Mood/delirium/insomnia: Seroquel 52m QHS at 8:30pm   Will DC Atarax on 5/27 due to possible constipation to confusion  Continue sleep-wake  chart   Bladder may be affecting sleep also. 5. Neuropsych: This patient is not capable of making decisions on her own behalf.  6. Skin/Wound Care: Routine skin checks   Left hand where IV infiltrated still with swelling and tenderness.    Started Keflex on 5/23 7. Fluids/Electrolytes/Nutrition: encourage PO  8. CAP/fevers. 8-day course of Merrem completed, afebrile at present  9. Dysphagia. Dysphagia #2 thin liquids. Follow-up speech therapy   Encourage PO.  10. Chronic systolic congestive heart failure. Monitor for any signs of fluid overload  Filed Weights   08/10/17 0500 08/11/17 0523 08/12/17 0324  Weight: 77.2 kg (170 lb 3.1 oz) 78 kg (171 lb 15.3 oz) 78.1 kg (172 lb 2.9 oz)  11. History of CAD. Continue aspirin and Plavix  12. CKD stage II.   Cr 1.14 on 5/22  Labs ordered for tomorrow  Cont to monitor 13.  Hypertension. Norvasc 10 mg daily, Cozaar 100 mg daily.   Coreg 12.5 mg twice daily, increased to 25 on 5/24  Hydralazine 10 3 times a day started on 5/27  Hypertensive crisis overnight 14. Diabetes mellitus with peripheral neuropathy. Lantus insulin 15 units nightly.   Slightly labile on 5/27 15. History of breast cancer. Continue arimidex  16.  Urinary frequency/odor:   Urinalysis equivocal, U culture with multiple species  Started empiric Keflex on 5/23 17.  Confusion disorientation  CT head reviewed, unremarkable for acute intracranial process 18. Leukocytosis  WBCs 13.7 on 5/22  Continue to monitor  Labs ordered for tomorrow 17. Acute blood loss anemia  Hemoglobin 8.8 on 5/22  Continue to monitor  Labs ordered for tomorrow 18. Sleep disturbance  Melatonin added on 10/27  LOS (Days) 6 A FACE TO FACE EVALUATION WAS PERFORMED  [name] Katie Phenix MD 08/12/2017 9:03 AM

## 2017-08-13 ENCOUNTER — Inpatient Hospital Stay (HOSPITAL_COMMUNITY): Payer: Medicare Other | Admitting: Speech Pathology

## 2017-08-13 ENCOUNTER — Inpatient Hospital Stay (HOSPITAL_COMMUNITY): Payer: Medicare Other | Admitting: Physical Therapy

## 2017-08-13 ENCOUNTER — Inpatient Hospital Stay (HOSPITAL_COMMUNITY): Payer: Medicare Other | Admitting: Occupational Therapy

## 2017-08-13 DIAGNOSIS — D72823 Leukemoid reaction: Secondary | ICD-10-CM

## 2017-08-13 LAB — CBC WITH DIFFERENTIAL/PLATELET
ABS IMMATURE GRANULOCYTES: 0 10*3/uL (ref 0.0–0.1)
BASOS PCT: 0 %
Basophils Absolute: 0 10*3/uL (ref 0.0–0.1)
EOS ABS: 0.2 10*3/uL (ref 0.0–0.7)
EOS PCT: 3 %
HCT: 23.8 % — ABNORMAL LOW (ref 36.0–46.0)
Hemoglobin: 7.3 g/dL — ABNORMAL LOW (ref 12.0–15.0)
Immature Granulocytes: 1 %
Lymphocytes Relative: 23 %
Lymphs Abs: 1.2 10*3/uL (ref 0.7–4.0)
MCH: 24.8 pg — AB (ref 26.0–34.0)
MCHC: 30.7 g/dL (ref 30.0–36.0)
MCV: 81 fL (ref 78.0–100.0)
MONO ABS: 0.6 10*3/uL (ref 0.1–1.0)
MONOS PCT: 11 %
Neutro Abs: 3.4 10*3/uL (ref 1.7–7.7)
Neutrophils Relative %: 62 %
PLATELETS: 355 10*3/uL (ref 150–400)
RBC: 2.94 MIL/uL — ABNORMAL LOW (ref 3.87–5.11)
RDW: 17.2 % — AB (ref 11.5–15.5)
WBC: 5.4 10*3/uL (ref 4.0–10.5)

## 2017-08-13 LAB — BASIC METABOLIC PANEL
Anion gap: 13 (ref 5–15)
BUN: 23 mg/dL — AB (ref 6–20)
CO2: 21 mmol/L — AB (ref 22–32)
CREATININE: 0.99 mg/dL (ref 0.44–1.00)
Calcium: 8.8 mg/dL — ABNORMAL LOW (ref 8.9–10.3)
Chloride: 107 mmol/L (ref 101–111)
GFR calc Af Amer: >60 mL/min (ref >60)
GFR, EST NON AFRICAN AMERICAN: 56 mL/min — AB (ref >60)
GLUCOSE: 88 mg/dL (ref 65–99)
Potassium: 3.4 mmol/L — ABNORMAL LOW (ref 3.5–5.1)
Sodium: 141 mmol/L (ref 135–145)

## 2017-08-13 LAB — GLUCOSE, CAPILLARY
GLUCOSE-CAPILLARY: 125 mg/dL — AB (ref 65–99)
Glucose-Capillary: 96 mg/dL (ref 65–99)
Glucose-Capillary: 140 mg/dL — ABNORMAL HIGH (ref 65–99)
Glucose-Capillary: 123 mg/dL — ABNORMAL HIGH (ref 65–99)

## 2017-08-13 MED ORDER — PANTOPRAZOLE SODIUM 40 MG PO TBEC
40.0000 mg | DELAYED_RELEASE_TABLET | Freq: Every day | ORAL | Status: DC
  Administered 2017-08-13 – 2017-08-22 (×10): 40 mg via ORAL
  Filled 2017-08-13 (×10): qty 1

## 2017-08-13 NOTE — Progress Notes (Signed)
Speech Language Pathology Daily Session Note  Patient Details  Name: [name] MRN: [id number] Date of Birth: 22-May-1945  Today's Date: 08/13/2017 SLP Individual Time: 1115-1155 SLP Individual Time Calculation (min): 40 min  Short Term Goals: Week 1: SLP Short Term Goal 1 (Week 1): Patient will initiate functional tasks with Mod A verbal cues.  SLP Short Term Goal 2 (Week 1): Patient will orient to place, time and situation with Mod A multimodal cues.  SLP Short Term Goal 3 (Week 1): Patient will demonstrate sustained attention to functional tasks for 2 minutes with Max A multimodal cues.  SLP Short Term Goal 4 (Week 1): Patient will consume current diet with minimal overt s/s of aspiration with Min A verbal cues for use of swallowing strategies.  SLP Short Term Goal 5 (Week 1): Patient will consume trials of Dys. 3 textures with efficient mastication and complete oral clearing without overt s/s of aspiration over 2 sessions prior to upgrade.  SLP Short Term Goal 6 (Week 1): Patient will verbally respond to questions in 75% of opportunities with Mod A verbal cues.   Skilled Therapeutic Interventions: Skilled treatment session focused on cognitive goals. SLP facilitated session by providing Mod A question cues for patient to generate a list of ingredients and Max A multimodal cues to verbally sequence steps to a familiar recipe. Patient also required Max A verbal cues for orientation to time and situation and Min-Mod A verbal cues to recall events from previous therapy sessions. Patient left upright in wheelchair with family present. Continue with current plan of care.      Function:   Cognition Comprehension Comprehension assist level: Understands basic 75 - 89% of the time/ requires cueing 10 - 24% of the time  Expression   Expression assist level: Expresses basic 75 - 89% of the time/requires cueing 10 - 24% of the time. Needs helper to occlude trach/needs to repeat words.  Social  Interaction Social Interaction assist level: Interacts appropriately 75 - 89% of the time - Needs redirection for appropriate language or to initiate interaction.  Problem Solving Problem solving assist level: Solves basic 25 - 49% of the time - needs direction more than half the time to initiate, plan or complete simple activities  Memory Memory assist level: Recognizes or recalls 25 - 49% of the time/requires cueing 50 - 75% of the time    Pain No/Denies Pain   Therapy/Group: Individual Therapy  Shuayb Schepers 08/13/2017, 3:44 PM

## 2017-08-13 NOTE — Progress Notes (Signed)
[place] MEDICINE & REHABILITATION     PROGRESS NOTE    Subjective/Complaints: Had a good night. Slept well. "when am I going home?"  ROS: Patient denies fever, rash, sore throat, blurred vision, nausea, vomiting, diarrhea, cough, shortness of breath or chest pain, joint or back pain, headache, or mood change.    Objective:  No results found. Recent Labs    08/13/17 0419  WBC 5.4  HGB 7.3*  HCT 23.8*  PLT 355   Recent Labs    08/13/17 0419  NA 141  K 3.4*  CL 107  GLUCOSE 88  BUN 23*  CREATININE 0.99  CALCIUM 8.8*   CBG (last 3)  Recent Labs    08/12/17 2256 08/13/17 0634 08/13/17 1224  GLUCAP 107* 96 140*    Wt Readings from Last 3 Encounters:  08/13/17 78.6 kg (173 lb 4.5 oz)  08/06/17 81.7 kg (180 lb 1.9 oz)  06/19/17 87.1 kg (192 lb 1.6 oz)     Intake/Output Summary (Last 24 hours) at 08/13/2017 1312 Last data filed at 08/13/2017 0800 Gross per 24 hour  Intake 360 ml  Output -  Net 360 ml    Vital Signs: Blood pressure (!) 168/68, pulse 84, temperature 97.9 F (36.6 C), temperature source Oral, resp. rate 18, height 5' 4"  (1.626 m), weight 78.6 kg (173 lb 4.5 oz), SpO2 98 %. Physical Exam:  Constitutional: No distress . Vital signs reviewed. HEENT: EOMI, oral membranes moist Neck: supple Cardiovascular: RRR without murmur. No JVD    Respiratory: CTA Bilaterally without wheezes or rales. Normal effort    GI: BS +, non-tender, non-distended   Musculoskeletal: Left hand with minimal edema Neurological:  More alert   Motor: RUE: 4-/5 proximal to distal (stable) LUE: shoulder abduction, elbow flex/ext 4/5, hand grip 4-/5 (pain inhibition, stable) B/l LE: HF 2+/5, KE 3-/5, ADF 4-/5 Psychiatric: more animated  Assessment/Plan: 1. Functional deficits secondary to debility/encephalopathy which require 3+ hours per day of interdisciplinary therapy in a comprehensive inpatient rehab setting. Physiatrist is providing close team  supervision and 24 hour management of active medical problems listed below. Physiatrist and rehab team continue to assess barriers to discharge/monitor patient progress toward functional and medical goals.  Function:  Bathing Bathing position   Position: Shower  Bathing parts Body parts bathed by patient: Chest, Right arm, Left arm, Abdomen, Front perineal area, Left upper leg, Right upper leg Body parts bathed by helper: Buttocks, Right lower leg, Left lower leg, Back  Bathing assist Assist Level: (mod A)      Upper Body Dressing/Undressing Upper body dressing   What is the patient wearing?: Bra, Pull over shirt/dress Bra - Perfomed by patient: Thread/unthread right bra strap, Thread/unthread left bra strap Bra - Perfomed by helper: Hook/unhook bra (pull down sports bra) Pull over shirt/dress - Perfomed by patient: Thread/unthread right sleeve, Thread/unthread left sleeve, Put head through opening, Pull shirt over trunk          Upper body assist Assist Level: Touching or steadying assistance(Pt > 75%)      Lower Body Dressing/Undressing Lower body dressing   What is the patient wearing?: Underwear, Pants, Non-skid slipper socks Underwear - Performed by patient: Thread/unthread left underwear leg, Pull underwear up/down Underwear - Performed by helper: Thread/unthread right underwear leg Pants- Performed by patient: Pull pants up/down Pants- Performed by helper: Thread/unthread right pants leg, Thread/unthread left pants leg   Non-skid slipper socks- Performed by helper: Don/doff right sock, Don/doff left sock  TED Hose - Performed by helper: Don/doff right TED hose, Don/doff left TED hose  Lower body assist Assist for lower body dressing: (max A)      Toileting Toileting Toileting activity did not occur: No continent bowel/bladder event   Toileting steps completed by helper: Adjust clothing prior to toileting, Performs perineal hygiene, Adjust clothing  after toileting Toileting Assistive Devices: Grab bar or rail  Toileting assist Assist level: Two helpers   Transfers Chair/bed transfer   Chair/bed transfer method: Stand pivot Chair/bed transfer assist level: Touching or steadying assistance (Pt > 75%) Chair/bed transfer assistive device: Armrests     Locomotion Ambulation Ambulation activity did not occur: Safety/medical concerns   Max distance: 15' Assist level: Touching or steadying assistance (Pt > 75%)   Wheelchair Wheelchair activity did not occur: Safety/medical concerns Type: Manual   Assist Level: Dependent (Pt equals 0%)  Cognition Comprehension Comprehension assist level: Understands basic 75 - 89% of the time/ requires cueing 10 - 24% of the time  Expression Expression assist level: Expresses basic 75 - 89% of the time/requires cueing 10 - 24% of the time. Needs helper to occlude trach/needs to repeat words.  Social Interaction Social Interaction assist level: Interacts appropriately 75 - 89% of the time - Needs redirection for appropriate language or to initiate interaction.  Problem Solving Problem solving assist level: Solves basic 25 - 49% of the time - needs direction more than half the time to initiate, plan or complete simple activities  Memory Memory assist level: Recognizes or recalls 25 - 49% of the time/requires cueing 50 - 75% of the time   Medical Problem List and Plan:  1. Debility and encephalopathy secondary to ARDS/cardiogenic shock/PEA arrest /CAP/multi-medical-extubated 07/31/2017   Cont CIR, team conference today 2. DVT Prophylaxis/Anticoagulation: hold given ABLA  3. Pain Management: Tylenol as needed  4. Mood/delirium/insomnia: Seroquel 18m QHS at 8:30pm   Atarax stopped on 5/27 due to possible contribution to confusion  Continue sleep-wake  chart    . 5. Neuropsych: This patient is not capable of making decisions on her own behalf.  6. Skin/Wound Care: Routine skin checks   Left hand where  IV infiltrated improving    Started Keflex on 5/23--continue thru 5/30 7. Fluids/Electrolytes/Nutrition: encourage PO  8. CAP/fevers. 8-day course of Merrem completed, afebrile at present  9. Dysphagia. Dysphagia #2 thin liquids. Follow-up speech therapy   Encourage PO.  10. Chronic systolic congestive heart failure. Monitor for any signs of fluid overload  Filed Weights   08/11/17 0523 08/12/17 0324 08/13/17 0624  Weight: 78 kg (171 lb 15.3 oz) 78.1 kg (172 lb 2.9 oz) 78.6 kg (173 lb 4.5 oz)  11. History of CAD. Continue aspirin and Plavix  12. CKD stage II.   Cr 0.99 5/28  Cont to monitor 13. Hypertension. Norvasc 10 mg daily, Cozaar 100 mg daily.   Coreg 12.5 mg twice daily, increased to 25 on 5/24  Hydralazine 10 3 times a day started on 5/27 with some improvement---observe today 14. Diabetes mellitus with peripheral neuropathy. Lantus insulin 15 units nightly.   Slightly labile on 5/28 15. History of breast cancer. Continue arimidex  16.  Urinary frequency/odor:   Urinalysis equivocal, U culture with multiple species  Continue Keflex   5/23 17.  Confusion disorientation  CT head reviewed, unremarkable for acute intracranial process 18. Leukocytosis  WBCs 5.4 5/28    17. Acute blood loss anemia  Hemoglobin down to 7.3 today  -heme check stool  -serial cbc's  -  hold sq hep  -begin protonix 18. Sleep disturbance  Melatonin added on 10/27  LOS (Days) [place] EVALUATION WAS PERFORMED  Meredith Staggers, MD 08/13/2017 1:12 PM

## 2017-08-13 NOTE — Progress Notes (Signed)
Physical Therapy Note  Patient Details  Name: [name] MRN: [id number] Date of Birth: Sep 11, 1945 Today's Date: 08/13/2017    Time: [phone] 57 minutes  1:1 No c/o pain.  Pt performs gait with RW 25' x 4 throughout session with min/steadying assist.  Furniture transfers to sofa and recliner with min A, cues for UE placement and increased time for initiation.  Stair negotiation with 2 handrails with min A x 4 stairs.  Standing horsershoe task with 1 UE support with pt able to balance and perform task with supervision.  Seated peg task with pt requiring max multimodal cues to perform simple picture.  Pt left in room with needs at hand, alarm set, family present.   Johnie Stadel 08/13/2017, 10:30 AM

## 2017-08-13 NOTE — Progress Notes (Signed)
Occupational Therapy Session Note  Patient Details  Name: [name] MRN: [id number] Date of Birth: 1945/09/06  Today's Date: 08/13/2017 OT Individual Time: 0800-0900 and 1500-1526 OT Individual Time Calculation (min): 60 min and 26 min    Short Term Goals: Week 1:  OT Short Term Goal 1 (Week 1): Pt will perform toilet transfer with max A stand pivot transfer.  OT Short Term Goal 2 (Week 1): Pt will initiate UB bathing with increased time and mod multimodal cues.  OT Short Term Goal 3 (Week 1): Pt will perform UB dressing with mod A in order to decrease level of assist with self care.   Skilled Therapeutic Interventions/Progress Updates:    Session 1: Upon entering the room, pt supine in bed with family member present. Pt agreeable with encouragement to shower this session. Pt performed stand pivot transfer to wheelchair with min A. Pt transferred from wheelchair > TTB in same manner with use of grab bar. Pt bathing from seated position in shower with mod multimodal cues for sequencing. Pt standing with min A to wash peri area and therapist assisted with washing buttocks. Pt returning to wheelchair for grooming tasks at sink with set up A. Pt donning clothing items while seated in wheelchair at sink as well. Min A to fasten bra and min verbal cues to don pull over shirt. Pt required assistance with LB clothing management. Pt standing with min A for balance to pull pants over B feet. Pt remained in wheelchair with chair alarm activated and call bell within reach upon exiting the room.   Session 2: Upon entering the room, pt seated in wheelchair with no c/o pain and agreeable to OT intervention. OT assisted pt via wheelchair to day room for time management. Pt engaged in table top card game to address attention, problem solving, and B coordination task. Pt able to deal cards out with mod multimodal cues for one card to each person at a time. Pt matching cards with max multimodal cues to attend to  task and turn taking. Pt did not have any drops from L hand this session. OT assisted pt back to room at end of session. Family member present and all needs within reach upon exiting the room.   Therapy Documentation Precautions:  Precautions Precautions: Fall Precaution Comments: fearful of falling Restrictions Weight Bearing Restrictions: No General:   Vital Signs: Therapy Vitals Temp: 97.9 F (36.6 C) Temp Source: Oral Pulse Rate: 84 Resp: 18 BP: (!) 168/68 Patient Position (if appropriate): Lying Oxygen Therapy SpO2: 98 % O2 Device: Room Air Pain: Pain Assessment Pain Scale: 0-10 Pain Score: 0-No pain  See Function Navigator for Current Functional Status.   Therapy/Group: Individual Therapy  [name] 08/13/2017, 9:59 AM

## 2017-08-14 ENCOUNTER — Inpatient Hospital Stay (HOSPITAL_COMMUNITY): Payer: Medicare Other | Admitting: Speech Pathology

## 2017-08-14 ENCOUNTER — Inpatient Hospital Stay (HOSPITAL_COMMUNITY): Payer: Medicare Other

## 2017-08-14 ENCOUNTER — Inpatient Hospital Stay (HOSPITAL_COMMUNITY): Payer: Medicare Other | Admitting: Occupational Therapy

## 2017-08-14 LAB — CBC
HCT: 23.4 % — ABNORMAL LOW (ref 36.0–46.0)
Hemoglobin: 7.3 g/dL — ABNORMAL LOW (ref 12.0–15.0)
MCH: 25.2 pg — AB (ref 26.0–34.0)
MCHC: 31.2 g/dL (ref 30.0–36.0)
MCV: 80.7 fL (ref 78.0–100.0)
PLATELETS: 362 10*3/uL (ref 150–400)
RBC: 2.9 MIL/uL — ABNORMAL LOW (ref 3.87–5.11)
RDW: 17.2 % — AB (ref 11.5–15.5)
WBC: 5.4 10*3/uL (ref 4.0–10.5)

## 2017-08-14 LAB — GLUCOSE, CAPILLARY
Glucose-Capillary: 98 mg/dL (ref 65–99)
Glucose-Capillary: 133 mg/dL — ABNORMAL HIGH (ref 65–99)
Glucose-Capillary: 125 mg/dL — ABNORMAL HIGH (ref 65–99)
Glucose-Capillary: 98 mg/dL (ref 65–99)

## 2017-08-14 MED ORDER — FERROUS SULFATE 325 (65 FE) MG PO TABS
325.0000 mg | ORAL_TABLET | Freq: Every day | ORAL | Status: DC
  Administered 2017-08-14 – 2017-08-22 (×9): 325 mg via ORAL
  Filled 2017-08-14 (×9): qty 1

## 2017-08-14 NOTE — Progress Notes (Signed)
Speech Language Pathology Weekly Progress and Session Note  Patient Details  Name: [name] MRN: [id number] Date of Birth: Aug 17, 1945  Beginning of progress report period: Aug 07, 2017 End of progress report period: Aug 14, 2017  Today's Date: 08/14/2017 SLP Individual Time: 1130-1200 SLP Individual Time Calculation (min): 30 min  Short Term Goals: Week 1: SLP Short Term Goal 1 (Week 1): Patient will initiate functional tasks with Mod A verbal cues.  SLP Short Term Goal 1 - Progress (Week 1): Met SLP Short Term Goal 2 (Week 1): Patient will orient to place, time and situation with Mod A multimodal cues.  SLP Short Term Goal 2 - Progress (Week 1): Not met SLP Short Term Goal 3 (Week 1): Patient will demonstrate sustained attention to functional tasks for 2 minutes with Max A multimodal cues.  SLP Short Term Goal 3 - Progress (Week 1): Met SLP Short Term Goal 4 (Week 1): Patient will consume current diet with minimal overt s/s of aspiration with Min A verbal cues for use of swallowing strategies.  SLP Short Term Goal 4 - Progress (Week 1): Met SLP Short Term Goal 5 (Week 1): Patient will consume trials of Dys. 3 textures with efficient mastication and complete oral clearing without overt s/s of aspiration over 2 sessions prior to upgrade.  SLP Short Term Goal 5 - Progress (Week 1): Met SLP Short Term Goal 6 (Week 1): Patient will verbally respond to questions in 75% of opportunities with Mod A verbal cues.  SLP Short Term Goal 6 - Progress (Week 1): Met    New Short Term Goals: Week 2: SLP Short Term Goal 1 (Week 2): Patient will orient to place, time and situation with Mod A multimodal cues.  SLP Short Term Goal 2 (Week 2): Patient will recall daily information with use of external aids with Mod A verbal cues.  SLP Short Term Goal 3 (Week 2): Patient will demonstrate functional problem solving with basic and familiar tasks with Mod A verbal cues.  SLP Short Term Goal 4 (Week 2):  Patient will demonstrate sustained attention to functional tasks for 10 minutes with Mod A verbal cues for redirection.  SLP Short Term Goal 5 (Week 2): Patient will verbally respond to questions in 100% of opportunities with Min A verbal cues.  SLP Short Term Goal 6 (Week 2): Patient will consume current diet with minimal overt s/s of aspiration with Mod I for use of swallowing strategies.   Weekly Progress Updates: Patient has made excellent gains and has met 6 of 6 STG's this reporting period. Currently, patient is consuming regular textures with thin liquids without overt s/s of aspiration and supervision verbal cues for use of swallowing compensatory strategies. Patient is demonstrating increased verbal expression both spontaneously and in response to questions. However, patient continues to require overall Mod A verbal cues for initiation of tasks and Max A verbal cues for sustained attention to tasks and recall of daily information with use of external aids. Patient and family education is ongoing. Patient would benefit from f/u SLP services to maximize her cognitive and swallowing function prior to discharge.      Intensity: Minumum of 1-2 x/day, 30 to 90 minutes Frequency: 3 to 5 out of 7 days Duration/Length of Stay: 08/22/17 Treatment/Interventions: Cognitive remediation/compensation;Internal/external aids;Environmental controls;Speech/Language facilitation;Therapeutic Activities;Patient/family education;Dysphagia/aspiration precaution training;Cueing hierarchy;Functional tasks   Daily Session  Skilled Therapeutic Interventions: Skilled treatment session focused on dysphagia and cognitive goals. SLP facilitated session by providing skilled observation with  lunch meal of regular textures with thin liquids ([employer]). Patient demonstrated efficient mastication with complete oral clearance and without overt s/s of aspiration. Therefore, recommend patient upgrade to regular textures. Patient  demonstrated increased verbal expression this session and independently recalled the RN's name and recalled that she needed a medication. Patient left upright in wheelchair with RN present. Continue with current plan of care.       Function:   Eating Eating   Modified Consistency Diet: No Eating Assist Level: Supervision or verbal cues;Set up assist for   Eating Set Up Assist For: Opening containers       Cognition Comprehension Comprehension assist level: Understands basic 75 - 89% of the time/ requires cueing 10 - 24% of the time  Expression   Expression assist level: Expresses basic 75 - 89% of the time/requires cueing 10 - 24% of the time. Needs helper to occlude trach/needs to repeat words.  Social Interaction Social Interaction assist level: Interacts appropriately 75 - 89% of the time - Needs redirection for appropriate language or to initiate interaction.  Problem Solving Problem solving assist level: Solves basic 50 - 74% of the time/requires cueing 25 - 49% of the time  Memory Memory assist level: Recognizes or recalls 50 - 74% of the time/requires cueing 25 - 49% of the time   Pain No/Denies Pain   Therapy/Group: Individual Therapy  Lason Eveland 08/14/2017, 3:30 PM

## 2017-08-14 NOTE — Progress Notes (Signed)
[place] PHYSICAL MEDICINE & REHABILITATION     PROGRESS NOTE    Subjective/Complaints: No new issues. Just awakening when I entered.  ROS: Patient denies fever, rash, sore throat, blurred vision, nausea, vomiting, diarrhea, cough, shortness of breath or chest pain, joint or back pain, headache, or mood change.     Objective:  No results found. Recent Labs    08/13/17 0419 08/14/17 0421  WBC 5.4 5.4  HGB 7.3* 7.3*  HCT 23.8* 23.4*  PLT 355 362   Recent Labs    08/13/17 0419  NA 141  K 3.4*  CL 107  GLUCOSE 88  BUN 23*  CREATININE 0.99  CALCIUM 8.8*   CBG (last 3)  Recent Labs    08/13/17 1626 08/13/17 2059 08/14/17 0632  GLUCAP 123* 125* 98    Wt Readings from Last 3 Encounters:  08/14/17 79 kg (174 lb 2.6 oz)  08/06/17 81.7 kg (180 lb 1.9 oz)  06/19/17 87.1 kg (192 lb 1.6 oz)     Intake/Output Summary (Last 24 hours) at 08/14/2017 0853 Last data filed at 08/14/2017 0753 Gross per 24 hour  Intake 290 ml  Output -  Net 290 ml    Vital Signs: Blood pressure (!) 151/61, pulse 76, temperature 98.9 F (37.2 C), temperature source Oral, resp. rate 16, height 5' 4"  (1.626 m), weight 79 kg (174 lb 2.6 oz), SpO2 94 %. Physical Exam:  Constitutional: No distress . Vital signs reviewed. HEENT: EOMI, oral membranes moist Neck: supple Cardiovascular: RRR without murmur. No JVD    Respiratory: CTA Bilaterally without wheezes or rales. Normal effort    GI: BS +, non-tender, non-distended   Musculoskeletal: Left hand with minimal edema Neurological:  More alert   Motor: RUE: 4-/5 proximal to distal (stable) LUE: shoulder abduction, elbow flex/ext 4/5, hand grip 4-/5   B/l LE: HF 2+/5, KE 3-/5, ADF 4-/5 Psychiatric: cooperative  Assessment/Plan: 1. Functional deficits secondary to debility/encephalopathy which require 3+ hours per day of interdisciplinary therapy in a comprehensive inpatient rehab setting. Physiatrist is providing close team supervision  and 24 hour management of active medical problems listed below. Physiatrist and rehab team continue to assess barriers to discharge/monitor patient progress toward functional and medical goals.  Function:  Bathing Bathing position   Position: Shower  Bathing parts Body parts bathed by patient: Chest, Right arm, Left arm, Abdomen, Front perineal area, Left upper leg, Right upper leg Body parts bathed by helper: Buttocks, Right lower leg, Left lower leg, Back  Bathing assist Assist Level: (mod A)      Upper Body Dressing/Undressing Upper body dressing   What is the patient wearing?: Bra, Pull over shirt/dress Bra - Perfomed by patient: Thread/unthread right bra strap, Thread/unthread left bra strap Bra - Perfomed by helper: Hook/unhook bra (pull down sports bra) Pull over shirt/dress - Perfomed by patient: Thread/unthread right sleeve, Thread/unthread left sleeve, Put head through opening, Pull shirt over trunk          Upper body assist Assist Level: Touching or steadying assistance(Pt > 75%)      Lower Body Dressing/Undressing Lower body dressing   What is the patient wearing?: Underwear, Pants, Non-skid slipper socks Underwear - Performed by patient: Thread/unthread left underwear leg, Pull underwear up/down Underwear - Performed by helper: Thread/unthread right underwear leg Pants- Performed by patient: Pull pants up/down Pants- Performed by helper: Thread/unthread right pants leg, Thread/unthread left pants leg   Non-skid slipper socks- Performed by helper: Don/doff right sock, Don/doff left sock  TED Hose - Performed by helper: Don/doff right TED hose, Don/doff left TED hose  Lower body assist Assist for lower body dressing: (max A)      Toileting Toileting Toileting activity did not occur: No continent bowel/bladder event   Toileting steps completed by helper: Adjust clothing prior to toileting, Performs perineal hygiene, Adjust clothing after  toileting Toileting Assistive Devices: Grab bar or rail  Toileting assist Assist level: Two helpers   Transfers Chair/bed transfer   Chair/bed transfer method: Stand pivot Chair/bed transfer assist level: Touching or steadying assistance (Pt > 75%) Chair/bed transfer assistive device: Armrests     Locomotion Ambulation Ambulation activity did not occur: Safety/medical concerns   Max distance: 15' Assist level: Touching or steadying assistance (Pt > 75%)   Wheelchair Wheelchair activity did not occur: Safety/medical concerns Type: Manual   Assist Level: Dependent (Pt equals 0%)  Cognition Comprehension Comprehension assist level: Understands basic 75 - 89% of the time/ requires cueing 10 - 24% of the time  Expression Expression assist level: Expresses basic 75 - 89% of the time/requires cueing 10 - 24% of the time. Needs helper to occlude trach/needs to repeat words.  Social Interaction Social Interaction assist level: Interacts appropriately 75 - 89% of the time - Needs redirection for appropriate language or to initiate interaction.  Problem Solving Problem solving assist level: Solves basic 25 - 49% of the time - needs direction more than half the time to initiate, plan or complete simple activities  Memory Memory assist level: Recognizes or recalls 25 - 49% of the time/requires cueing 50 - 75% of the time   Medical Problem List and Plan:  1. Debility and encephalopathy secondary to ARDS/cardiogenic shock/PEA arrest /CAP/multi-medical-extubated 07/31/2017   Cont CIR therapies 2. DVT Prophylaxis/Anticoagulation: hold given ABLA  3. Pain Management: Tylenol as needed  4. Mood/delirium/insomnia: Seroquel 10m QHS at 8:30pm   Atarax stopped on 5/27 due to possible contribution to confusion  Continue sleep-wake  chart    . 5. Neuropsych: This patient is not capable of making decisions on her own behalf.  6. Skin/Wound Care: Routine skin checks   Left hand where IV infiltrated  improving    Started Keflex on 5/23--continue thru 5/30 7. Fluids/Electrolytes/Nutrition: encourage PO  8. CAP/fevers. 8-day course of Merrem completed, afebrile at present  9. Dysphagia. Dysphagia #2 thin liquids. Follow-up speech therapy   Encourage PO.  10. Chronic systolic congestive heart failure.   -weights stable  Filed Weights   08/12/17 0324 08/13/17 0624 08/14/17 0413  Weight: 78.1 kg (172 lb 2.9 oz) 78.6 kg (173 lb 4.5 oz) 79 kg (174 lb 2.6 oz)  11. History of CAD. Continue aspirin and Plavix  12. CKD stage II.   Cr 0.99 5/28  Cont to monitor 13. Hypertension. Norvasc 10 mg daily, Cozaar 100 mg daily.   Coreg 12.5 mg twice daily, increased to 25 on 5/24  Hydralazine 10 3 times a day started on 5/27   -reassonable control 5/29 14. Diabetes mellitus with peripheral neuropathy. Lantus insulin 15 units nightly.   controlled on 5/29 15. History of breast cancer. Continue arimidex  16.  Urinary frequency/odor:   Urinalysis equivocal, U culture with multiple species  Continue Keflex   5/23 17.  Confusion disorientation  CT head reviewed, unremarkable for acute intracranial process 18. Leukocytosis  WBCs 5.4 5/28    17. Acute blood loss anemia  Hemoglobin stable at 7.3 today  -stool sample not checkedyet  -serial cbc's  -hold sq hep  -  begin protonix 18. Sleep disturbance  Melatonin added on 10/27  LOS (Days) [place] EVALUATION WAS PERFORMED  Meredith Staggers, MD 08/14/2017 8:53 AM

## 2017-08-14 NOTE — Progress Notes (Signed)
Occupational Therapy Weekly Progress Note  Patient Details  Name: [name] MRN: [id number] Date of Birth: [birth year]  Beginning of progress report period: Aug 07, 2017 End of progress report period: Aug 14, 2017  Today's Date: 08/14/2017 OT Individual Time: 8845-7334 OT Individual Time Calculation (min): 71 min    Patient has met 3 of 3 short term goals.   Patient continues to demonstrate the following deficits: muscle weakness, decreased cardiorespiratoy endurance, decreased initiation, decreased attention, decreased awareness, decreased problem solving, decreased safety awareness and decreased memory and decreased standing balance and decreased balance strategies and therefore will continue to benefit from skilled OT intervention to enhance overall performance with BADL and iADL.  Patient progressing toward long term goals..  Continue plan of care.  OT Short Term Goals Week 1:  OT Short Term Goal 1 (Week 1): Pt will perform toilet transfer with max A stand pivot transfer.  OT Short Term Goal 1 - Progress (Week 1): Met OT Short Term Goal 2 (Week 1): Pt will initiate UB bathing with increased time and mod multimodal cues.  OT Short Term Goal 2 - Progress (Week 1): Met OT Short Term Goal 3 (Week 1): Pt will perform UB dressing with mod A in order to decrease level of assist with self care. OT Short Term Goal 3 - Progress (Week 1): Met Week 2:  OT Short Term Goal 1 (Week 2): STGs=LTGS secondary to upcoming discharge date  Skilled Therapeutic Interventions/Progress Updates:    Upon entering the room, pt supine in bed with no c/o pain. Pt requests to use bathroom with min A for sit >stand. Pt ambulating with RW to bathroom with overall steady assistance. Pt managed clothing, transfer, and hygiene with overall steady assistance. Pt ambulated to sink for bathing and dressing at sink level. Pt required steady assistance for balance with LB clothing management. Pt required mod multimodal  cues for sequencing and attending to task. Pt able to utilize B UE ergomenter for increased strength and endurance for 5 minutes total with rest breaks as needed. Pt returned to room with chair alarm activated and call call within reach upon exiting the room.   Therapy Documentation Precautions:  Precautions Precautions: Fall Precaution Comments: fearful of falling Restrictions Weight Bearing Restrictions: No  See Function Navigator for Current Functional Status.   Therapy/Group: Individual Therapy  [name] 08/14/2017, 9:20 AM

## 2017-08-14 NOTE — Progress Notes (Addendum)
Physical Therapy Note  Patient Details  Name: [name] MRN: [id number] Date of Birth: Oct 13, 1945 Today's Date: 08/14/2017  1015-1130, 75 min individual tx Pain: "a little bit" L hand; moderately edematous; PT and pt provided retrograde massage  Pt oriented to day, but thought it was June, 1947.  She self- corrected herself; "no, that's not right", and eventually was able to choose 2019 out of 2 choices.  PT provided retrograde massage L hand, and instructed pt in doing this, as well as elevating her hand with pillow when sitting up in w/c or in bed.  Pt will need further education due to cognitive deficts.  Stand pivot transfer w/c > NuStep using RW, with R hand on RW, min assist.  NuStep for general flexiblity and activty tolerance, level 5 , rated moderate exertion, x 5 minutes.  Pt reported L knee OA but was comfortable using NuStep.  Seated on mat, beach ball volleys using 2# weighted bar, x 10 x 2. In standing, min guard assist, pt volleyed beach ball as above, without LOB, x 10.  Pt needed cues for correct, safe hand placement during sit <> stand 90% of trials.   Sustained stretch bil heel cords, and balance challenge, in standing on wedge, x 45 seconds, x 75 seconds, with RW, using R hand to retrieve and manipulate clothes pins by color onto bars, 100% accurate.  Gait training with RW on level tile, x 100' with 2 turns, with min assist, min cues for technique.   Simulated car transfer with mod assist for lowering pt, and RLe when entering, stand pivot using RW.  Pt left resting in w/c with alarm set and all needs within reach; dtr [name] in room with her..  See function navigator for current status.   [name] 08/14/2017, 7:56 AM

## 2017-08-14 NOTE — Progress Notes (Signed)
Occupational Therapy Session Note  Patient Details  Name: [name] MRN: [id number] Date of Birth: Nov 28, 1945  Today's Date: 08/14/2017 OT Individual Time: 9826-4158 OT Individual Time Calculation (min): 28 min    Short Term Goals: Week 1:  OT Short Term Goal 1 (Week 1): Pt will perform toilet transfer with max A stand pivot transfer.  OT Short Term Goal 2 (Week 1): Pt will initiate UB bathing with increased time and mod multimodal cues.  OT Short Term Goal 3 (Week 1): Pt will perform UB dressing with mod A in order to decrease level of assist with self care.  Skilled Therapeutic Interventions/Progress Updates:    Pt seen for OT session focusing on ADL re-training, functional transfers, and cognitive remediation. Pt received sitting in w/c in bathroom upon arrival with grand daughter requesting assist to help pt to bathroom. Pt requiring VCs for redirection and attention to task. Completed imin A stand pivot transfer to raised toilet seat with use of grab bars, min steadying assist while pt completed clothing management. She returned to w/c and following seated rest break, completed hand hygiene and oral care standing at sink with CGA. Pt's granddaughter requesting therapist write down what pt does in therapy in order for family to be updated on progress. Had pt write down each therapy session of the day, referring to therapy schedule to recall therapy sessions and therapists. She was able to correctly recall events of 2/3 therapy sessions today. She was oriented to date but not correct month. Pt with difficulty spelling basic words despite given opportunity to copy correct spelling. Pt asking regarding d/c date, updated per team conference note, pt satisfied with planned d/c of 6/6. Pt left seated in w/c at end of session with chair alarm on. Re-educated to use of call bell and not to attempt to get into bathroom with w/c independently.   Therapy Documentation Precautions:   Precautions Precautions: Fall Precaution Comments: fearful of falling Restrictions Weight Bearing Restrictions: No Pain:   No/denies pain  See Function Navigator for Current Functional Status.   Therapy/Group: Individual Therapy  Rodolfo Gaster L 08/14/2017, 7:23 AM

## 2017-08-15 ENCOUNTER — Inpatient Hospital Stay (HOSPITAL_COMMUNITY): Payer: Medicare Other | Admitting: Speech Pathology

## 2017-08-15 ENCOUNTER — Inpatient Hospital Stay (HOSPITAL_COMMUNITY): Payer: Medicare Other | Admitting: Occupational Therapy

## 2017-08-15 ENCOUNTER — Inpatient Hospital Stay (HOSPITAL_COMMUNITY): Payer: Medicare Other | Admitting: Physical Therapy

## 2017-08-15 LAB — GLUCOSE, CAPILLARY
GLUCOSE-CAPILLARY: 91 mg/dL (ref 65–99)
GLUCOSE-CAPILLARY: 131 mg/dL — AB (ref 65–99)
GLUCOSE-CAPILLARY: 128 mg/dL — AB (ref 65–99)
Glucose-Capillary: 102 mg/dL — ABNORMAL HIGH (ref 65–99)

## 2017-08-15 LAB — CBC
HCT: 23.7 % — ABNORMAL LOW (ref 36.0–46.0)
Hemoglobin: 7.3 g/dL — ABNORMAL LOW (ref 12.0–15.0)
MCH: 25.2 pg — ABNORMAL LOW (ref 26.0–34.0)
MCHC: 30.8 g/dL (ref 30.0–36.0)
MCV: 81.7 fL (ref 78.0–100.0)
PLATELETS: 358 10*3/uL (ref 150–400)
RBC: 2.9 MIL/uL — ABNORMAL LOW (ref 3.87–5.11)
RDW: 17.2 % — AB (ref 11.5–15.5)
WBC: 5.3 10*3/uL (ref 4.0–10.5)

## 2017-08-15 NOTE — Progress Notes (Signed)
Social Work Patient ID: MAN BONNEAU, female   DOB: [birth year], 72 y.o.   MRN: [id number]   Have reviewed team conference with pt and daughter, [name].  Both aware and agreeable with targeted d/c date of 08/22/17 and supervision to min assist goals overall.  Daughter confirms that family is prepared to provide 24/7 assistance.  Will need to schedule family ed for next week.  Continue to follow.  Legrande Hao, LCSW

## 2017-08-15 NOTE — Progress Notes (Signed)
Routine Line Care: No blood return noted at proximal lumen. Unable to flush line with or without cap. Medial and distal lumen flush without difficulty.

## 2017-08-15 NOTE — Progress Notes (Addendum)
Physical Therapy Note  Patient Details  Name: [name] MRN: [id number] Date of Birth: 19-Oct-1945 Today's Date: 08/15/2017    Time: 830-924 54 minutes  1:1 No c/o pain.  Pt in w/c requesting to "wash up".  Pt able to propel w/c throughout room to gather clothes with supervision.  Mod cuing for w/c parts management and reminders to lock brakes.  Pt performs bathing at sink level with supervision cuing for sequencing tasks, pt able to bathe all body parts except back without assistance.  Pt dons clothing with min A for UB and LB dressing.  Supervision for sit <> stand to bathe peri area and don pants.  Pt performs gait up to 150' with RW with close supervision. Gait with obstacle negotiation with close supervision with RW.  Nustep x 6 minutes for strengthening UEs and LEs level 5.  Pt left in room with needs at hand, alarm set, RN present.   Time 2: 1130-1200 30 minutes  1:1 No c/o pain. Session focused on dynamic standing balance with ball toss and ball kick with pt initially hesitant to perform without UE support but pt able to perform with min A without UE support for catching, min A with 1 UE support for kicking.  Gait without AD for continued balance training with 1 HHA, min A x 25'.  Pt states she feels "better" with the RW.  Pt left in room with alarm set, needs at hand.  Veleda Mun 08/15/2017, 9:25 AM

## 2017-08-15 NOTE — Patient Care Conference (Signed)
Inpatient RehabilitationTeam Conference and Plan of Care Update Date: 08/13/2017   Time: 2:30 PM    Patient Name: [name]      Medical Record Number: [id number]  Date of Birth: [birth year] Sex: Female         Room/Bed: 4W10C/4W10C-01 Payor Info: Payor: [name] MEDICARE / Plan: [facility] MEDICARE / Product Type: *No Product type* /    Admitting Diagnosis: Debility  Admit Date/Time:  08/06/2017  6:37 PM Admission Comments: No comment available   Primary Diagnosis:  <principal problem not specified> Principal Problem: <principal problem not specified>  Patient Active Problem List   Diagnosis Date Noted  . Sleep disturbance   . Acute blood loss anemia   . Leukocytosis   . Essential hypertension   . Urinary frequency   . Type 2 diabetes mellitus with peripheral neuropathy (HCC)   . Benign essential HTN   . Hypertensive crisis   . Stage 2 chronic kidney disease   . Dysphagia   . ARDS (adult respiratory distress syndrome) ([place]) 08/06/2017  . Debility   . Encephalopathy   . Pressure injury of skin 08/04/2017  . Pneumonia of left lung due to infectious organism   . Acute respiratory failure with hypoxemia ([place])   . Cardiac arrest, cause unspecified ([place])   . Shock circulatory ([place])   . Septic shock ([place])   . HCAP (healthcare-associated pneumonia)   . Diffuse pulmonary alveolar hemorrhage   . Chest pain 07/23/2017  . Hypoxia 07/23/2017  . SOB (shortness of breath) 10/11/2016  . Hyperparathyroidism, secondary renal ([place]) 05/02/2016  . Gout 04/11/2015  . Diabetic neuropathy associated with type 2 diabetes mellitus ([place]) 01/06/2015  . Breast cancer of lower-inner quadrant of right female breast ([place]) 03/18/2013  . Chronic venous insufficiency 01/06/2013  . Health care maintenance 05/08/2011  . Diabetes mellitus type 2 in obese ([place]) 05/03/2010  . Seasonal allergies 05/03/2010  . Labile hypertension   . CAD (coronary artery disease)   . Abdominal discomfort   . Anemia in  chronic kidney disease   . History of stroke without residual deficits   . Hyperlipidemia associated with type 2 diabetes mellitus ([place])   . Chronic pain   . Chronic kidney disease, stage III (moderate) (HCC)   . Adnexal mass     Expected Discharge Date: Expected Discharge Date: 08/22/17  Team Members Present: Physician leading conference: Dr. Alger Simons Social Worker Present: Lennart Pall, LCSW Nurse Present: Leonette Nutting, RN PT Present: Roderic Ovens, PT OT Present: Benay Pillow, OT SLP Present: Weston Anna, SLP PPS Coordinator present : Daiva Nakayama, RN, CRRN     Current Status/Progress Goal Weekly Team Focus  Medical   debility and encephalopathy after multiple medical issues. poor sleep habits, sun-downing, hallucinations at night  improve day/night  nutrition, bp control   Bowel/Bladder   Continent of Bladder/bowel,   Maintain continence   Assess QS and prn offer and provide schedule meds for constipation and ordered prn medications   Swallow/Nutrition/ Hydration   Dys. 2 textures with thin liquids, supervision  Supervision  Trials of upgraded textures    ADL's   min A for functional transfers, max A LB dressing, min A LB dressing  min A overall, Supervision for grooming and UB self care  self care retaining, balance, functional transfer/mobility, cognition, pt/family edu   Mobility   min A for transfers and gait with RW  min A overall  cognition, pt/family ed, balance   Communication  Safety/Cognition/ Behavioral Observations  Mod-Max A  Min A  problem solving, attention, recall    Pain   deneis pain   < 3  QS and prn assessment with follow up    Skin   Left hand edema subsiding keep elevated, foam dressing to buttocks   Maintain skin intergument  QS and prn assessment    Rehab Goals Patient on target to meet rehab goals: Yes *See Care Plan and progress notes for long and short-term goals.     Barriers to Discharge  Current Status/Progress  Possible Resolutions Date Resolved   Physician                    Nursing                  PT                    OT                  SLP                SW                Discharge Planning/Teaching Needs:  Pt to d/c home with daughters and family sharing in provision of 24/7 supervision/ assistance.  Teaching to be planned closer to d/c.   Team Discussion:  Encephalopathic;  Some improved sleep?  More alert today but extremely poor attention span.  Min assist currently with PT. OT except max assist LB and max cues.  Upgraded PT goals to supervision .  Family to bring lunch tomorrow to see if can upgrade diet.  Still max assist problem solving and attention.  Revisions to Treatment Plan:  None    Continued Need for Acute Rehabilitation Level of Care: The patient requires daily medical management by a physician with specialized training in physical medicine and rehabilitation for the following conditions: Daily direction of a multidisciplinary physical rehabilitation program to ensure safe treatment while eliciting the highest outcome that is of practical value to the patient.: Yes Daily medical management of patient stability for increased activity during participation in an intensive rehabilitation regime.: Yes Daily analysis of laboratory values and/or radiology reports with any subsequent need for medication adjustment of medical intervention for : Post surgical problems;Blood pressure problems  Shakendra Griffeth 08/14/2017, 10:09 AM

## 2017-08-15 NOTE — Progress Notes (Signed)
Occupational Therapy Session Note  Patient Details  Name: [name] MRN: [id number] Date of Birth: 10/02/1945  Today's Date: 08/15/2017 OT Individual Time: 1000-1100 OT Individual Time Calculation (min): 60 min    Short Term Goals: Week 1:  OT Short Term Goal 1 (Week 1): Pt will perform toilet transfer with max A stand pivot transfer.  OT Short Term Goal 1 - Progress (Week 1): Met OT Short Term Goal 2 (Week 1): Pt will initiate UB bathing with increased time and mod multimodal cues.  OT Short Term Goal 2 - Progress (Week 1): Met OT Short Term Goal 3 (Week 1): Pt will perform UB dressing with mod A in order to decrease level of assist with self care. OT Short Term Goal 3 - Progress (Week 1): Met  Skilled Therapeutic Interventions/Progress Updates:    1:1 focus on cognitive tasks sit to stands with focus on sustained to selective attention to tasks and  functional use of left hand with mod cues to execute with multiple bouts of needing to go to the bathroom .  Pt could demonstrate toileting and toilet transfers all with supervision (each time). Pt able to perform money counting task with min A for functional problem solving. Pt also completed a peg board activity (coping a picture) with extra time; demonstrating ability to self correct. Pt performed standing balance activity on the [employer] with focus on weight shifts outside of BOS with min A for balance. Left with NT in the bathroom.  Therapy Documentation Precautions:  Precautions Precautions: Fall Precaution Comments: fearful of falling Restrictions Weight Bearing Restrictions: No Pain: No c/o pain  See Function Navigator for Current Functional Status.   Therapy/Group: Individual Therapy  [facility] 08/15/2017, 3:49 PM

## 2017-08-15 NOTE — Progress Notes (Signed)
[facility] PHYSICAL MEDICINE & REHABILITATION     PROGRESS NOTE    Subjective/Complaints: Up in chair. No new problems. Denies pain. Left hand feeling better  ROS: Patient denies fever, rash, sore throat, blurred vision, nausea, vomiting, diarrhea, cough, shortness of breath or chest pain, joint or back pain, headache, or mood change.   Objective:  No results found. Recent Labs    08/14/17 0421 08/15/17 0426  WBC 5.4 5.3  HGB 7.3* 7.3*  HCT 23.4* 23.7*  PLT 362 358   Recent Labs    08/13/17 0419  NA 141  K 3.4*  CL 107  GLUCOSE 88  BUN 23*  CREATININE 0.99  CALCIUM 8.8*   CBG (last 3)  Recent Labs    08/14/17 2116 08/15/17 0644 08/15/17 1126  GLUCAP 98 91 131*    Wt Readings from Last 3 Encounters:  08/15/17 79.1 kg (174 lb 6.1 oz)  08/06/17 81.7 kg (180 lb 1.9 oz)  06/19/17 87.1 kg (192 lb 1.6 oz)     Intake/Output Summary (Last 24 hours) at 08/15/2017 1324 Last data filed at 08/15/2017 0900 Gross per 24 hour  Intake 360 ml  Output -  Net 360 ml    Vital Signs: Blood pressure (!) 154/55, pulse 81, temperature 98.9 F (37.2 C), temperature source Oral, resp. rate 18, height 5' 4"  (1.626 m), weight 79.1 kg (174 lb 6.1 oz), SpO2 92 %. Physical Exam:  Constitutional: No distress . Vital signs reviewed. HEENT: EOMI, oral membranes moist Neck: supple Cardiovascular: RRR without murmur. No JVD    Respiratory: CTA Bilaterally without wheezes or rales. Normal effort    GI: BS +, non-tender, non-distended   Musculoskeletal: Left hand with minimal edema Neurological:  More alert   Motor: RUE: 4-/5 proximal to distal (stable) LUE: shoulder abduction, elbow flex/ext 4/5, hand grip 4-/5   B/l LE: HF 2+/5, KE 3-/5, ADF 4-/5 Psychiatric: cooperative  Assessment/Plan: 1. Functional deficits secondary to debility/encephalopathy which require 3+ hours per day of interdisciplinary therapy in a comprehensive inpatient rehab setting. Physiatrist is providing  close team supervision and 24 hour management of active medical problems listed below. Physiatrist and rehab team continue to assess barriers to discharge/monitor patient progress toward functional and medical goals.  Function:  Bathing Bathing position   Position: Wheelchair/chair at sink  Bathing parts Body parts bathed by patient: Chest, Right arm, Left arm, Abdomen, Front perineal area, Left upper leg, Right upper leg, Buttocks Body parts bathed by helper: Right lower leg, Left lower leg, Back  Bathing assist Assist Level: Touching or steadying assistance(Pt > 75%)      Upper Body Dressing/Undressing Upper body dressing   What is the patient wearing?: Bra, Pull over shirt/dress Bra - Perfomed by patient: Thread/unthread right bra strap, Thread/unthread left bra strap Bra - Perfomed by helper: Hook/unhook bra (pull down sports bra) Pull over shirt/dress - Perfomed by patient: Thread/unthread right sleeve, Thread/unthread left sleeve, Put head through opening, Pull shirt over trunk          Upper body assist Assist Level: Touching or steadying assistance(Pt > 75%)      Lower Body Dressing/Undressing Lower body dressing   What is the patient wearing?: Underwear, Pants Underwear - Performed by patient: Thread/unthread left underwear leg, Pull underwear up/down Underwear - Performed by helper: Thread/unthread right underwear leg Pants- Performed by patient: Pull pants up/down, Thread/unthread left pants leg Pants- Performed by helper: Thread/unthread right pants leg   Non-skid slipper socks- Performed by helper: Don/doff  right sock, Don/doff left sock               TED Hose - Performed by helper: Don/doff right TED hose, Don/doff left TED hose  Lower body assist Assist for lower body dressing: Touching or steadying assistance (Pt > 75%)      Toileting Toileting Toileting activity did not occur: No continent bowel/bladder event Toileting steps completed by patient:  Adjust clothing prior to toileting, Performs perineal hygiene, Adjust clothing after toileting Toileting steps completed by helper: Adjust clothing prior to toileting, Performs perineal hygiene, Adjust clothing after toileting Toileting Assistive Devices: Grab bar or rail  Toileting assist Assist level: Supervision or verbal cues, Touching or steadying assistance (Pt.75%)   Transfers Chair/bed transfer   Chair/bed transfer method: Stand pivot Chair/bed transfer assist level: Touching or steadying assistance (Pt > 75%) Chair/bed transfer assistive device: Armrests, Medical sales representative Ambulation activity did not occur: Safety/medical concerns   Max distance: 100 Assist level: Touching or steadying assistance (Pt > 75%)   Wheelchair Wheelchair activity did not occur: Safety/medical concerns Type: Manual   Assist Level: Dependent (Pt equals 0%)  Cognition Comprehension Comprehension assist level: Understands basic 75 - 89% of the time/ requires cueing 10 - 24% of the time  Expression Expression assist level: Expresses basic 75 - 89% of the time/requires cueing 10 - 24% of the time. Needs helper to occlude trach/needs to repeat words.  Social Interaction Social Interaction assist level: Interacts appropriately 75 - 89% of the time - Needs redirection for appropriate language or to initiate interaction.  Problem Solving Problem solving assist level: Solves basic 50 - 74% of the time/requires cueing 25 - 49% of the time  Memory Memory assist level: Recognizes or recalls 50 - 74% of the time/requires cueing 25 - 49% of the time   Medical Problem List and Plan:  1. Debility and encephalopathy secondary to ARDS/cardiogenic shock/PEA arrest /CAP/multi-medical-extubated 07/31/2017   Cont CIR therapies 2. DVT Prophylaxis/Anticoagulation: hold given ABLA  3. Pain Management: Tylenol as needed  4. Mood/delirium/insomnia: Seroquel 51m QHS at 8:30pm   Atarax stopped on 5/27 due to  possible contribution to confusion  Continue sleep-wake  chart    .-improved arousal and sleep-wake patterns 5. Neuropsych: This patient is not capable of making decisions on her own behalf.  6. Skin/Wound Care: Routine skin checks   Left hand where IV infiltrated improving    Keflex 5/23-30 7. Fluids/Electrolytes/Nutrition: encourage PO  8. CAP/fevers. 8-day course of Merrem completed, afebrile at present  9. Dysphagia. Dysphagia #2 thin liquids. Follow-up speech therapy   Encourage PO.  10. Chronic systolic congestive heart failure.   -weights stable  Filed Weights   08/13/17 0624 08/14/17 0413 08/15/17 0427  Weight: 78.6 kg (173 lb 4.5 oz) 79 kg (174 lb 2.6 oz) 79.1 kg (174 lb 6.1 oz)  11. History of CAD. Continue aspirin and Plavix  12. CKD stage II.   Cr 0.99 5/28  Cont to monitor 13. Hypertension. Norvasc 10 mg daily, Cozaar 100 mg daily.   Coreg 12.5 mg twice daily, increased to 25 on 5/24  Hydralazine 10 3 times a day started on 5/27   -reassonable control 5/30 14. Diabetes mellitus with peripheral neuropathy. Lantus insulin 15 units nightly.   controlled on 5/30 15. History of breast cancer. Continue arimidex  16.  Urinary frequency/odor:   Urinalysis equivocal, U culture with multiple species  Continue Keflex   5/23 17.  Confusion disorientation  CT  head reviewed, unremarkable for acute intracranial process 18. Leukocytosis  WBCs 5.4 5/28    17. Acute blood loss anemia  Hemoglobin stable at 7.3 today  -stool sample still not checked yet  -serial cbc's  -hold sq hep  -began protonix  -no clinical signs of bleeding. Pt denies hx of GI bleed. I see no recent hx of GI. CT of abdomen from a year ago without an explation  -continue serial labs. Check iron study 18. Sleep disturbance  Melatonin added on 10/27  LOS (Days) 9 A FACE TO FACE EVALUATION WAS PERFORMED  Meredith Staggers, MD 08/15/2017 1:24 PM

## 2017-08-15 NOTE — Progress Notes (Signed)
Physical Therapy Weekly Progress Note  Patient Details  Name: [name] MRN: [id number] Date of Birth: January 20, 1946  Beginning of progress report period: Aug 07, 2017 End of progress report period: Aug 15, 2017   Patient has met 3 of 3 short term goals.  Pt making great gains and is able to perform transfers, gait and mobility with min A.  Pt limited mostly by cognitive deficits requiring max cuing for initiation, attention and problem solving.  Patient continues to demonstrate the following deficits muscle weakness, abnormal tone, decreased coordination and decreased motor planning, decreased initiation, decreased attention, decreased awareness, decreased problem solving, decreased safety awareness, decreased memory and delayed processing and decreased standing balance, decreased postural control, hemiplegia and decreased balance strategies and therefore will continue to benefit from skilled PT intervention to increase functional independence with mobility.  Patient progressing toward long term goals..  Continue plan of care.  PT Short Term Goals Week 1:  PT Short Term Goal 1 (Week 1): Pt will complete bed mobility with mod A consistently PT Short Term Goal 1 - Progress (Week 1): Met PT Short Term Goal 2 (Week 1): Pt will tolerate sitting up in chair x 1 hour PT Short Term Goal 2 - Progress (Week 1): Met PT Short Term Goal 3 (Week 1): Pt will perform least restricitive transfer with max A consistently PT Short Term Goal 3 - Progress (Week 1): Met Week 2:  PT Short Term Goal 1 (Week 2): = LTG  Skilled Therapeutic Interventions/Progress Updates:  Ambulation/gait training;Balance/vestibular training;Cognitive remediation/compensation;Discharge planning;DME/adaptive equipment instruction;Functional mobility training;Neuromuscular re-education;Pain management;Patient/family education;Stair training;Therapeutic Activities;Therapeutic Exercise;UE/LE Strength taining/ROM;UE/LE Coordination  activities;Wheelchair propulsion/positioning;Community reintegration;Splinting/orthotics     See Function Navigator for Current Functional Status.  [name] 08/15/2017, 8:19 AM

## 2017-08-15 NOTE — Progress Notes (Signed)
Speech Language Pathology Daily Session Note  Patient Details  Name: [name] MRN: [id number] Date of Birth: 1945/06/20  Today's Date: 08/15/2017 SLP Individual Time: 1415-1500 SLP Individual Time Calculation (min): 45 min  Short Term Goals: Week 2: SLP Short Term Goal 1 (Week 2): Patient will orient to place, time and situation with Mod A multimodal cues.  SLP Short Term Goal 2 (Week 2): Patient will recall daily information with use of external aids with Mod A verbal cues.  SLP Short Term Goal 3 (Week 2): Patient will demonstrate functional problem solving with basic and familiar tasks with Mod A verbal cues.  SLP Short Term Goal 4 (Week 2): Patient will demonstrate sustained attention to functional tasks for 10 minutes with Mod A verbal cues for redirection.  SLP Short Term Goal 5 (Week 2): Patient will verbally respond to questions in 100% of opportunities with Min A verbal cues.  SLP Short Term Goal 6 (Week 2): Patient will consume current diet with minimal overt s/s of aspiration with Mod I for use of swallowing strategies.   Skilled Therapeutic Interventions: Skilled treatment session focused on cognitive goals. SLP facilitated session by administering the MoCA (version 7.3). Patient scored 11/30 points with a score of 18 or above considered normal. Patient demonstrated deficits in executive functioning, attention, problem solving and immediate/short-term recall. However, patient demonstrates increased verbal initiation and overall participation with tasks. SLP also provided Min A verbal and question cues for intellectual awareness of patient's current cognitive and physical impairments and Mod A for orientation to time. Patient's memory notebook was also updated to maximize organization. Patient left upright in wheelchair with alarm on, quick release belt in place and all needs within reach. Continue with current plan of care.      Function:  Eating Eating   Modified Consistency  Diet: No Eating Assist Level: Supervision or verbal cues   Eating Set Up Assist For: Opening containers       Cognition Comprehension Comprehension assist level: Understands basic 75 - 89% of the time/ requires cueing 10 - 24% of the time  Expression   Expression assist level: Expresses basic 75 - 89% of the time/requires cueing 10 - 24% of the time. Needs helper to occlude trach/needs to repeat words.  Social Interaction Social Interaction assist level: Interacts appropriately 75 - 89% of the time - Needs redirection for appropriate language or to initiate interaction.  Problem Solving Problem solving assist level: Solves basic 50 - 74% of the time/requires cueing 25 - 49% of the time  Memory Memory assist level: Recognizes or recalls 25 - 49% of the time/requires cueing 50 - 75% of the time    Pain No/Denies Pain   Therapy/Group: Individual Therapy  Gwenlyn Hottinger 08/15/2017, 4:07 PM

## 2017-08-16 ENCOUNTER — Inpatient Hospital Stay (HOSPITAL_COMMUNITY): Payer: Medicare Other | Admitting: Occupational Therapy

## 2017-08-16 ENCOUNTER — Inpatient Hospital Stay (HOSPITAL_COMMUNITY): Payer: Medicare Other | Admitting: Physical Therapy

## 2017-08-16 ENCOUNTER — Inpatient Hospital Stay (HOSPITAL_COMMUNITY): Payer: Medicare Other

## 2017-08-16 LAB — IRON AND TIBC
IRON: 50 ug/dL (ref 28–170)
SATURATION RATIOS: 23 % (ref 10.4–31.8)
TIBC: 213 ug/dL — AB (ref 250–450)
UIBC: 163 ug/dL

## 2017-08-16 LAB — FERRITIN: Ferritin: 350 ng/mL — ABNORMAL HIGH (ref 11–307)

## 2017-08-16 LAB — CBC
HCT: 24.9 % — ABNORMAL LOW (ref 36.0–46.0)
Hemoglobin: 7.7 g/dL — ABNORMAL LOW (ref 12.0–15.0)
MCH: 25.1 pg — AB (ref 26.0–34.0)
MCHC: 30.9 g/dL (ref 30.0–36.0)
MCV: 81.1 fL (ref 78.0–100.0)
PLATELETS: 375 10*3/uL (ref 150–400)
RBC: 3.07 MIL/uL — AB (ref 3.87–5.11)
RDW: 17.3 % — ABNORMAL HIGH (ref 11.5–15.5)
WBC: 6.1 10*3/uL (ref 4.0–10.5)

## 2017-08-16 LAB — GLUCOSE, CAPILLARY
GLUCOSE-CAPILLARY: 144 mg/dL — AB (ref 65–99)
Glucose-Capillary: 86 mg/dL (ref 65–99)
Glucose-Capillary: 174 mg/dL — ABNORMAL HIGH (ref 65–99)
Glucose-Capillary: 91 mg/dL (ref 65–99)

## 2017-08-16 NOTE — Progress Notes (Signed)
Occupational Therapy Session Note  Patient Details  Name: [name] MRN: [id number] Date of Birth: August 19, 1945  Today's Date: 08/16/2017 OT Individual Time: 1034-1200 OT Individual Time Calculation (min): 86 min   Skilled Therapeutic Interventions/Progress Updates:    Pt greeted in w/c with dtr [name] present. Already dressed and ADL needs met. Tx focus on cognitive remediation, dynamic balance, and functional ambulation during IADL participation. Escorted pt down to gift shop and had her simulate buying gifts for granddaughter Jasmine's baby shower in June. Pt ambulated in gift shop using RW with supervision, avoiding environmental barriers and removing one UE to reach for items on level shelves. After selecting two appropriate gifts, had pt calculate sum using paper money with mod vcs for accuracy.  While in food court area, also had pt practice transfers in and out of booth table with RW with cues for technique (she likes to go out to eat with family quite a bit, a favorite being [place]). Pt required max vcs for pathfinding back to unit and recalling familiar signs/landmarks we had discussed on the way down. Once back in room, increased dynamic balance challenges via bedmaking tasks. Pt ambulated around room with HHA assist while holding linen to minimize furniture walking. Min vcs for sequencing, attention, and problem solving during all tasks, including application of fitted sheet. She required multiple seated rest breaks due to fatigue. At end of session pt returned to w/c and was left with all needs, chair alarm set, and safety belt fastened.   Therapy Documentation Precautions:  Precautions Precautions: Fall Precaution Comments: fearful of falling Restrictions Weight Bearing Restrictions: No Pain: Pain Assessment Pain Score: 0-No pain ADL:       See Function Navigator for Current Functional Status.   Therapy/Group: Individual Therapy  Debie Ashline A Denny Mccree 08/16/2017,  12:47 PM

## 2017-08-16 NOTE — Progress Notes (Signed)
Physical Therapy Note  Patient Details  Name: [name] MRN: [id number] Date of Birth: May 08, 1945 Today's Date: 08/16/2017    Time: 830-925 55 minutes  1:1 No c/o pain.  Pt performs gait in room and bathroom with RW and supervision.  Supervision for toilet transfers and toileting including hygiene and clothing management.  Gait 150' x 2 with supervision with RW.  Curb step negotiation training for home entry with RW and min/steadying assist multiple attempts.  Step ups to 4'' steps 3 x 10 bilat for LE strengthening.  Gait with obstacle negotiation with RW with supervision.  Standing balance for functional tasks of handwashing and washing face.  Pt left in room with alarm set, nursing present.   Bert Ptacek 08/16/2017, 9:27 AM

## 2017-08-16 NOTE — Progress Notes (Signed)
Speech Language Pathology Daily Session Note  Patient Details  Name: [name] MRN: [id number] Date of Birth: 09-Apr-1945  Today's Date: 08/16/2017 SLP Individual Time: 1450-1532 SLP Individual Time Calculation (min): 42 min  Short Term Goals: Week 2: SLP Short Term Goal 1 (Week 2): Patient will orient to place, time and situation with Mod A multimodal cues.  SLP Short Term Goal 2 (Week 2): Patient will recall daily information with use of external aids with Mod A verbal cues.  SLP Short Term Goal 3 (Week 2): Patient will demonstrate functional problem solving with basic and familiar tasks with Mod A verbal cues.  SLP Short Term Goal 4 (Week 2): Patient will demonstrate sustained attention to functional tasks for 10 minutes with Mod A verbal cues for redirection.  SLP Short Term Goal 5 (Week 2): Patient will verbally respond to questions in 100% of opportunities with Min A verbal cues.  SLP Short Term Goal 6 (Week 2): Patient will consume current diet with minimal overt s/s of aspiration with Mod I for use of swallowing strategies.   Skilled Therapeutic Interventions: Skilled treatment session focused on cognitive-linguistic goals. SLP facilitated session by providing Max A verbal and visual cues to complete basic visual sequencing task of general ADL pictures. She recalled morning activities with use of external memory aid (e.g. memory notebook) with 100% accuracy. She completed a basic problem-solving task of sorting foods into appropriate food groups with 100% accuracy given Min A verbal cues to implement executive functioning strategies such as crossing each item off as it was completed. Following rehearsal, pt recalled 4 out of 5 items on grocery list, given verbally, following a 5-minute delay with distraction. Oriented to self, location, situation, and date given intermittent Min A verbal cues. Patient's memory notebook was also updated to maximize organization. Patient left upright in  wheelchair with alarm on, friend present, quick release belt in place and all needs within reach. Continue with current plan of care.   Function:  Eating Eating   Modified Consistency Diet: No Eating Assist Level: Supervision or verbal cues   Eating Set Up Assist For: Opening containers       Cognition Comprehension Comprehension assist level: Understands basic 75 - 89% of the time/ requires cueing 10 - 24% of the time  Expression   Expression assist level: Expresses basic 75 - 89% of the time/requires cueing 10 - 24% of the time. Needs helper to occlude trach/needs to repeat words.  Social Interaction Social Interaction assist level: Interacts appropriately 75 - 89% of the time - Needs redirection for appropriate language or to initiate interaction.  Problem Solving Problem solving assist level: Solves basic 50 - 74% of the time/requires cueing 25 - 49% of the time  Memory Memory assist level: Recognizes or recalls 50 - 74% of the time/requires cueing 25 - 49% of the time    Pain Pain Assessment Pain Score: 0-No pain  Therapy/Group: Individual Therapy  [name] A [name] 08/16/2017, 3:49 PM

## 2017-08-16 NOTE — Progress Notes (Signed)
[place] PHYSICAL MEDICINE & REHABILITATION     PROGRESS NOTE    Subjective/Complaints: Eating breakfast. No new issues today. Slept well  ROS: Patient denies fever, rash, sore throat, blurred vision, nausea, vomiting, diarrhea, cough, shortness of breath or chest pain, joint or back pain, headache, or mood change.   Objective:  No results found. Recent Labs    08/15/17 0426 08/16/17 0500  WBC 5.3 6.1  HGB 7.3* 7.7*  HCT 23.7* 24.9*  PLT 358 375   No results for input(s): NA, K, CL, GLUCOSE, BUN, CREATININE, CALCIUM in the last 72 hours.  Invalid input(s): CO CBG (last 3)  Recent Labs    08/15/17 1640 08/15/17 2150 08/16/17 0642  GLUCAP 102* 128* 86    Wt Readings from Last 3 Encounters:  08/16/17 75.6 kg (166 lb 10.7 oz)  08/06/17 81.7 kg (180 lb 1.9 oz)  06/19/17 87.1 kg (192 lb 1.6 oz)     Intake/Output Summary (Last 24 hours) at 08/16/2017 1119 Last data filed at 08/16/2017 0815 Gross per 24 hour  Intake 600 ml  Output -  Net 600 ml    Vital Signs: Blood pressure (!) 163/63, pulse 87, temperature 98.5 F (36.9 C), temperature source Oral, resp. rate 17, height 5' 4"  (1.626 m), weight 75.6 kg (166 lb 10.7 oz), SpO2 94 %. Physical Exam:  Constitutional: No distress . Vital signs reviewed. HEENT: EOMI, oral membranes moist Neck: supple Cardiovascular: RRR without murmur. No JVD    Respiratory: CTA Bilaterally without wheezes or rales. Normal effort    GI: BS +, non-tender, non-distended   Musculoskeletal: Left hand with minimal edema Neurological:  More alert   Motor: RUE: 4/5 proximal to distal  LUE: shoulder abduction, elbow flex/ext 4/5, hand grip 4/5   B/l LE: HF 2+ to 3-/5, KE 3/5, ADF 4-/5 Psychiatric: cooperative  Assessment/Plan: 1. Functional deficits secondary to debility/encephalopathy which require 3+ hours per day of interdisciplinary therapy in a comprehensive inpatient rehab setting. Physiatrist is providing close team supervision  and 24 hour management of active medical problems listed below. Physiatrist and rehab team continue to assess barriers to discharge/monitor patient progress toward functional and medical goals.  Function:  Bathing Bathing position   Position: Wheelchair/chair at sink  Bathing parts Body parts bathed by patient: Chest, Right arm, Left arm, Abdomen, Front perineal area, Left upper leg, Right upper leg, Buttocks Body parts bathed by helper: Right lower leg, Left lower leg, Back  Bathing assist Assist Level: Touching or steadying assistance(Pt > 75%)      Upper Body Dressing/Undressing Upper body dressing   What is the patient wearing?: Bra, Pull over shirt/dress Bra - Perfomed by patient: Thread/unthread right bra strap, Thread/unthread left bra strap Bra - Perfomed by helper: Hook/unhook bra (pull down sports bra) Pull over shirt/dress - Perfomed by patient: Thread/unthread right sleeve, Thread/unthread left sleeve, Put head through opening, Pull shirt over trunk          Upper body assist Assist Level: Touching or steadying assistance(Pt > 75%)      Lower Body Dressing/Undressing Lower body dressing   What is the patient wearing?: Underwear, Pants Underwear - Performed by patient: Thread/unthread left underwear leg, Pull underwear up/down Underwear - Performed by helper: Thread/unthread right underwear leg Pants- Performed by patient: Pull pants up/down, Thread/unthread left pants leg Pants- Performed by helper: Thread/unthread right pants leg   Non-skid slipper socks- Performed by helper: Don/doff right sock, Don/doff left sock  TED Hose - Performed by helper: Don/doff right TED hose, Don/doff left TED hose  Lower body assist Assist for lower body dressing: Touching or steadying assistance (Pt > 75%)      Toileting Toileting Toileting activity did not occur: No continent bowel/bladder event Toileting steps completed by patient: Adjust clothing prior to  toileting, Performs perineal hygiene, Adjust clothing after toileting Toileting steps completed by helper: Adjust clothing prior to toileting, Performs perineal hygiene, Adjust clothing after toileting Toileting Assistive Devices: Grab bar or rail  Toileting assist Assist level: Supervision or verbal cues, Touching or steadying assistance (Pt.75%)   Transfers Chair/bed transfer   Chair/bed transfer method: Stand pivot Chair/bed transfer assist level: Touching or steadying assistance (Pt > 75%) Chair/bed transfer assistive device: Armrests, Medical sales representative Ambulation activity did not occur: Safety/medical concerns   Max distance: 100 Assist level: Touching or steadying assistance (Pt > 75%)   Wheelchair Wheelchair activity did not occur: Safety/medical concerns Type: Manual   Assist Level: Dependent (Pt equals 0%)  Cognition Comprehension Comprehension assist level: Understands basic 75 - 89% of the time/ requires cueing 10 - 24% of the time  Expression Expression assist level: Expresses basic 75 - 89% of the time/requires cueing 10 - 24% of the time. Needs helper to occlude trach/needs to repeat words.  Social Interaction Social Interaction assist level: Interacts appropriately 50 - 74% of the time - May be physically or verbally inappropriate.  Problem Solving Problem solving assist level: Solves basic 50 - 74% of the time/requires cueing 25 - 49% of the time  Memory Memory assist level: Recognizes or recalls 50 - 74% of the time/requires cueing 25 - 49% of the time   Medical Problem List and Plan:  1. Debility and encephalopathy secondary to ARDS/cardiogenic shock/PEA arrest /CAP/multi-medical-extubated 07/31/2017   Cont CIR therapies 2. DVT Prophylaxis/Anticoagulation: hold given ABLA  3. Pain Management: Tylenol as needed  4. Mood/delirium/insomnia: Seroquel 53m QHS at 8:30pm   Atarax stopped on 5/27 due to possible contribution to confusion  Continue  sleep-wake  chart    .-improved arousal and sleep-wake patterns 5. Neuropsych: This patient is not capable of making decisions on her own behalf.  6. Skin/Wound Care: keflex completed for left hand cellulitis 7. Fluids/Electrolytes/Nutrition: encourage PO  8. CAP/fevers. 8-day course of Merrem completed, afebrile at present  9. Dysphagia. Dysphagia #2 thin liquids. Follow-up speech therapy   Encourage PO.  10. Chronic systolic congestive heart failure.   -weights stable  Filed Weights   08/14/17 0413 08/15/17 0427 08/16/17 0500  Weight: 79 kg (174 lb 2.6 oz) 79.1 kg (174 lb 6.1 oz) 75.6 kg (166 lb 10.7 oz)  11. History of CAD. Continue aspirin and Plavix  12. CKD stage II.   Cr 0.99 5/28  Cont to monitor 13. Hypertension. Norvasc 10 mg daily, Cozaar 100 mg daily.   Coreg 12.5 mg twice daily, increased to 25 on 5/24  Hydralazine 10 3 times a day started on 5/27   -reassonable control 5/31 14. Diabetes mellitus with peripheral neuropathy. Lantus insulin 15 units nightly.   controlled on 5/31 15. History of breast cancer. Continue arimidex  16.  Urinary frequency/odor:   Keflex completed 18. Leukocytosis  WBCs 5.4 5/28    17. Acute blood loss anemia  Hemoglobin up from 7.3 to 7.7 today  -iron studies indicated chronic disease component  -stool sample still not checked yet  -recheck cbc Monday  -stopped sq hep  -continue protonix  -no  clinical signs of bleeding. Pt denies hx of GI bleed. I see no recent hx of GI. CT of abdomen from a year ago without an explation  -continue serial labs.   18. Sleep disturbance  Melatonin added on 10/27  LOS (Days) [place] EVALUATION WAS PERFORMED  Meredith Staggers, MD 08/16/2017 11:19 AM

## 2017-08-16 NOTE — Progress Notes (Signed)
Patient alert and oriented;meds as ordered this shift; remains on sleep chart; patient slept throughout shift. Will continue to monitor.

## 2017-08-17 ENCOUNTER — Inpatient Hospital Stay (HOSPITAL_COMMUNITY): Payer: Medicare Other

## 2017-08-17 ENCOUNTER — Inpatient Hospital Stay (HOSPITAL_COMMUNITY): Payer: Medicare Other | Admitting: Occupational Therapy

## 2017-08-17 DIAGNOSIS — R7309 Other abnormal glucose: Secondary | ICD-10-CM

## 2017-08-17 DIAGNOSIS — E876 Hypokalemia: Secondary | ICD-10-CM

## 2017-08-17 LAB — GLUCOSE, CAPILLARY
GLUCOSE-CAPILLARY: 88 mg/dL (ref 65–99)
GLUCOSE-CAPILLARY: 132 mg/dL — AB (ref 65–99)
GLUCOSE-CAPILLARY: 111 mg/dL — AB (ref 65–99)
Glucose-Capillary: 131 mg/dL — ABNORMAL HIGH (ref 65–99)

## 2017-08-17 NOTE — Progress Notes (Signed)
[place] PHYSICAL MEDICINE & REHABILITATION     PROGRESS NOTE    Subjective/Complaints: Patient seen lying in bed this morning. She states she slept well overnight.  ROS: Denies CP, SOB, N/V/D  Objective:  No results found. Recent Labs    08/15/17 0426 08/16/17 0500  WBC 5.3 6.1  HGB 7.3* 7.7*  HCT 23.7* 24.9*  PLT 358 375   No results for input(s): NA, K, CL, GLUCOSE, BUN, CREATININE, CALCIUM in the last 72 hours.  Invalid input(s): CO CBG (last 3)  Recent Labs    08/16/17 1646 08/16/17 2112 08/17/17 0640  GLUCAP 174* 91 88    Wt Readings from Last 3 Encounters:  08/16/17 75.6 kg (166 lb 10.7 oz)  08/06/17 81.7 kg (180 lb 1.9 oz)  06/19/17 87.1 kg (192 lb 1.6 oz)     Intake/Output Summary (Last 24 hours) at 08/17/2017 0745 Last data filed at 08/16/2017 1545 Gross per 24 hour  Intake 390 ml  Output -  Net 390 ml    Vital Signs: Blood pressure (!) 138/59, pulse 100, temperature 99.4 F (37.4 C), temperature source Oral, resp. rate 16, height 5' 4"  (1.626 m), weight 75.6 kg (166 lb 10.7 oz), SpO2 98 %. Physical Exam:  Constitutional: No distress . Vital signs reviewed. HENT: Normocephalic.  Atraumatic. Eyes: EOMI. No discharge. Cardiovascular: RRR. No JVD. Respiratory: CTA Bilaterally. Normal effort. GI: BS +. Non-distended. Musc: No edema or tenderness in extremities. Neurological:  Alert   Motor: RUE: 4/5 proximal to distal  LUE: shoulder abduction, elbow flex/ext 4/5, hand grip 4/5   B/l LE: HF 4/5, KE 4/5, ADF 4/5 Psychiatric: cooperative  Assessment/Plan: 1. Functional deficits secondary to debility/encephalopathy which require 3+ hours per day of interdisciplinary therapy in a comprehensive inpatient rehab setting. Physiatrist is providing close team supervision and 24 hour management of active medical problems listed below. Physiatrist and rehab team continue to assess barriers to discharge/monitor patient progress toward functional and  medical goals.  Function:  Bathing Bathing position Bathing activity did not occur: Refused Position: Wheelchair/chair at sink  Bathing parts Body parts bathed by patient: Chest, Right arm, Left arm, Abdomen, Front perineal area, Left upper leg, Right upper leg, Buttocks Body parts bathed by helper: Right lower leg, Left lower leg, Back  Bathing assist Assist Level: Touching or steadying assistance(Pt > 75%)      Upper Body Dressing/Undressing Upper body dressing   What is the patient wearing?: Bra, Pull over shirt/dress Bra - Perfomed by patient: Thread/unthread right bra strap, Thread/unthread left bra strap Bra - Perfomed by helper: Hook/unhook bra (pull down sports bra) Pull over shirt/dress - Perfomed by patient: Thread/unthread right sleeve, Thread/unthread left sleeve, Put head through opening, Pull shirt over trunk          Upper body assist Assist Level: Touching or steadying assistance(Pt > 75%)      Lower Body Dressing/Undressing Lower body dressing   What is the patient wearing?: Underwear, Pants Underwear - Performed by patient: Thread/unthread left underwear leg, Pull underwear up/down Underwear - Performed by helper: Thread/unthread right underwear leg Pants- Performed by patient: Pull pants up/down, Thread/unthread left pants leg Pants- Performed by helper: Thread/unthread right pants leg   Non-skid slipper socks- Performed by helper: Don/doff right sock, Don/doff left sock               TED Hose - Performed by helper: Don/doff right TED hose, Don/doff left TED hose  Lower body assist Assist for lower body dressing:  Touching or steadying assistance (Pt > 75%)      Toileting Toileting Toileting activity did not occur: No continent bowel/bladder event Toileting steps completed by patient: Adjust clothing prior to toileting, Performs perineal hygiene, Adjust clothing after toileting Toileting steps completed by helper: Adjust clothing prior to toileting,  Performs perineal hygiene, Adjust clothing after toileting Toileting Assistive Devices: Grab bar or rail  Toileting assist Assist level: Supervision or verbal cues, Touching or steadying assistance (Pt.75%)   Transfers Chair/bed transfer   Chair/bed transfer method: Stand pivot Chair/bed transfer assist level: Touching or steadying assistance (Pt > 75%) Chair/bed transfer assistive device: Armrests, Medical sales representative Ambulation activity did not occur: Safety/medical concerns   Max distance: 100 Assist level: Touching or steadying assistance (Pt > 75%)   Wheelchair Wheelchair activity did not occur: Safety/medical concerns Type: Manual   Assist Level: Dependent (Pt equals 0%)  Cognition Comprehension Comprehension assist level: Understands basic 75 - 89% of the time/ requires cueing 10 - 24% of the time  Expression Expression assist level: Expresses basic 75 - 89% of the time/requires cueing 10 - 24% of the time. Needs helper to occlude trach/needs to repeat words.  Social Interaction Social Interaction assist level: Interacts appropriately 75 - 89% of the time - Needs redirection for appropriate language or to initiate interaction.  Problem Solving Problem solving assist level: Solves basic 50 - 74% of the time/requires cueing 25 - 49% of the time  Memory Memory assist level: Recognizes or recalls 50 - 74% of the time/requires cueing 25 - 49% of the time   Medical Problem List and Plan:  1. Debility and encephalopathy secondary to ARDS/cardiogenic shock/PEA arrest /CAP/multi-medical-extubated 07/31/2017   Cont CIR 2. DVT Prophylaxis/Anticoagulation: hold given ABLA  3. Pain Management: Tylenol as needed  4. Mood/delirium/insomnia: Seroquel 15m QHS at 8:30pm   Atarax stopped on 5/27 due to possible contribution to confusion  Continue sleep-wake  chart    .-improved arousal and sleep-wake patterns 5. Neuropsych: This patient is not capable of making decisions on her  own behalf.  6. Skin/Wound Care: keflex completed for left hand cellulitis 7. Fluids/Electrolytes/Nutrition: encourage PO  8. CAP/fevers. 8-day course of Merrem completed, afebrile at present  9. Dysphagia. Follow-up speech therapy   Encourage PO.   Dysphagia #2 thin liquids advanced to regular consistency diet on 5/29 10. Chronic systolic congestive heart failure.   -weights stable on 6/1 Filed Weights   08/14/17 0413 08/15/17 0427 08/16/17 0500  Weight: 79 kg (174 lb 2.6 oz) 79.1 kg (174 lb 6.1 oz) 75.6 kg (166 lb 10.7 oz)  11. History of CAD. Continue aspirin and Plavix  12. CKD stage II.   Cr 0.99 5/28  Labs ordered for Monday  Cont to monitor 13. Hypertension. Norvasc 10 mg daily, Cozaar 100 mg daily.   Coreg 12.5 mg twice daily, increased to 25 on 5/24  Hydralazine 10 3 times a day started on 5/27   Relatively controlled on 6/1 14. Diabetes mellitus with peripheral neuropathy. Lantus insulin 15 units nightly.   Slightly labile, but overall controlled on 6/1 15. History of breast cancer. Continue arimidex  16.  Urinary frequency/odor:   Keflex completed 18. Leukocytosis: Resolved  WBCs 5.4 on 6/1 17. Acute blood loss anemia  Hemoglobin 7.7 on 6/1  -iron studies indicated chronic disease component  -stool sample still not checked yet  -recheck cbc Monday  -stopped sq hep  -continue protonix  -no clinical signs of bleeding.   -  continue serial labs.   18. Sleep disturbance  Melatonin added on 10/27 19. Hypokalemia  Potassium 3.4 on 5/28  Labs ordered for Monday  LOS (Days) 11 A FACE TO FACE EVALUATION WAS PERFORMED  Ankit Lorie Phenix, MD 08/17/2017 7:45 AM

## 2017-08-17 NOTE — Progress Notes (Signed)
Occupational Therapy Session Note  Patient Details  Name: [name] MRN: [id number] Date of Birth: 04/17/1945  Today's Date: 08/17/2017 OT Group Time: 1100-1200 OT Group Time Calculation (min): 60 min   Skilled Therapeutic Interventions/Progress Updates:    Pt engaged in therapeutic w/c level dance group focusing on activity tolerance, UE/LE strengthening, and social participation. Pt required cues throughout for sustained attention, opting to march and lift LEs for majority of session. Pt declining opportunities to stand with RW due to fatigue and feeling cold. Pt held hands and swayed with group members for increasing social interaction. At end of session pt was returned to room and left with all needs.     Therapy Documentation Precautions:  Precautions Precautions: Fall Precaution Comments: fearful of falling Restrictions Weight Bearing Restrictions: No Pain: Pain Assessment Pain Score: 0-No pain ADL:       See Function Navigator for Current Functional Status.   Therapy/Group: Group Therapy  Shateria Paternostro A Faisal Stradling 08/17/2017, 12:48 PM

## 2017-08-17 NOTE — Plan of Care (Signed)
  Problem: RH SAFETY Goal: RH STG ADHERE TO SAFETY PRECAUTIONS W/ASSISTANCE/DEVICE Description STG Adhere to Safety Precautions With Min Assistance/Device.  Outcome: Progressing Lap on in wheelchair, bed alarm, proper footwear.

## 2017-08-17 NOTE — Progress Notes (Signed)
Speech Language Pathology Daily Session Note  Patient Details  Name: [name] MRN: [id number] Date of Birth: 21-Nov-1945  Today's Date: 08/17/2017 SLP Individual Time: 0930-1000 SLP Individual Time Calculation (min): 30 min  Short Term Goals: Week 2: SLP Short Term Goal 1 (Week 2): Patient will orient to place, time and situation with Mod A multimodal cues.  SLP Short Term Goal 2 (Week 2): Patient will recall daily information with use of external aids with Mod A verbal cues.  SLP Short Term Goal 3 (Week 2): Patient will demonstrate functional problem solving with basic and familiar tasks with Mod A verbal cues.  SLP Short Term Goal 4 (Week 2): Patient will demonstrate sustained attention to functional tasks for 10 minutes with Mod A verbal cues for redirection.  SLP Short Term Goal 5 (Week 2): Patient will verbally respond to questions in 100% of opportunities with Min A verbal cues.  SLP Short Term Goal 6 (Week 2): Patient will consume current diet with minimal overt s/s of aspiration with Mod I for use of swallowing strategies.   Skilled Therapeutic Interventions:Skilled ST services focused on cognitive skills. SLP facilitated orientation, pt required min A verbal/visual cues for year only. SLP facilitated basic problem solving with familiar task of sequencing four step cards, pt continued to required max A verbal cues for problem solving and error awareness. SLP added pages to memory notebook and pt utilized memory notebook to review yesterdays events with min A verbal cues. Pt was left in room with call bell within reach. Reccomend to continue skilled ST services.     Function:  Eating Eating                 Cognition Comprehension Comprehension assist level: Understands basic 75 - 89% of the time/ requires cueing 10 - 24% of the time  Expression   Expression assist level: Expresses basic 75 - 89% of the time/requires cueing 10 - 24% of the time. Needs helper to occlude  trach/needs to repeat words.  Social Interaction    Problem Solving Problem solving assist level: Solves basic 50 - 74% of the time/requires cueing 25 - 49% of the time;Solves basic 25 - 49% of the time - needs direction more than half the time to initiate, plan or complete simple activities  Memory Memory assist level: Recognizes or recalls 50 - 74% of the time/requires cueing 25 - 49% of the time;Recognizes or recalls 75 - 89% of the time/requires cueing 10 - 24% of the time    Pain Pain Assessment Pain Score: 0-No pain  Therapy/Group: Individual Therapy  Christifer Chapdelaine  [facility] 08/17/2017, 10:02 AM

## 2017-08-18 ENCOUNTER — Inpatient Hospital Stay (HOSPITAL_COMMUNITY): Payer: Medicare Other

## 2017-08-18 LAB — GLUCOSE, CAPILLARY
GLUCOSE-CAPILLARY: 133 mg/dL — AB (ref 65–99)
GLUCOSE-CAPILLARY: 89 mg/dL (ref 65–99)
Glucose-Capillary: 93 mg/dL (ref 65–99)
Glucose-Capillary: 165 mg/dL — ABNORMAL HIGH (ref 65–99)

## 2017-08-18 MED ORDER — FLUTICASONE PROPIONATE 50 MCG/ACT NA SUSP
1.0000 | Freq: Two times a day (BID) | NASAL | Status: DC | PRN
  Administered 2017-08-18: 1 via NASAL
  Filled 2017-08-18: qty 16

## 2017-08-18 NOTE — Progress Notes (Signed)
[facility]     PROGRESS NOTE    Subjective/Complaints: Pt seen lying in bed this AM.  She slept well overnight, confirmed with sleep chart.  She denies complaints.  ROS: denies CP, SOB, N/V/D  Objective:  No results found. Recent Labs    08/16/17 0500  WBC 6.1  HGB 7.7*  HCT 24.9*  PLT 375   No results for input(s): NA, K, CL, GLUCOSE, BUN, CREATININE, CALCIUM in the last 72 hours.  Invalid input(s): CO CBG (last 3)  Recent Labs    08/17/17 1207 08/17/17 1648 08/17/17 2219  GLUCAP 132* 111* 131*    Wt Readings from Last 3 Encounters:  08/18/17 76.6 kg (168 lb 14 oz)  08/06/17 81.7 kg (180 lb 1.9 oz)  06/19/17 87.1 kg (192 lb 1.6 oz)     Intake/Output Summary (Last 24 hours) at 08/18/2017 0657 Last data filed at 08/17/2017 1950 Gross per 24 hour  Intake 592 ml  Output -  Net 592 ml    Vital Signs: Blood pressure (!) 156/62, pulse 84, temperature 98.5 F (36.9 C), resp. rate 18, height 5' 4"  (1.626 m), weight 76.6 kg (168 lb 14 oz), SpO2 97 %. Physical Exam:  Constitutional: No distress . Vital signs reviewed. HENT: Normocephalic.  Atraumatic. Eyes: EOMI. No discharge. Cardiovascular: RRR. No JVD. Respiratory: CTA Bilaterally. Normal effort. GI: BS +. Non-distended. Musc: No edema or tenderness in extremities. Neurological:  Alert   Motor: RUE: 4/5 proximal to distal  LUE: shoulder abduction, elbow flex/ext 4/5, hand grip 4/5   B/l LE: HF 4/5, KE 4/5, ADF 4/5 (stable)  Psychiatric: cooperative  Assessment/Plan: 1. Functional deficits secondary to debility/encephalopathy which require 3+ hours per day of interdisciplinary therapy in a comprehensive inpatient rehab setting. Physiatrist is providing close team supervision and 24 hour management of active medical problems listed below. Physiatrist and rehab team continue to assess barriers to discharge/monitor patient progress toward functional and medical  goals.  Function:  Bathing Bathing position Bathing activity did not occur: Refused Position: Wheelchair/chair at sink  Bathing parts Body parts bathed by patient: Chest, Right arm, Left arm, Abdomen, Front perineal area, Left upper leg, Right upper leg, Buttocks Body parts bathed by helper: Right lower leg, Left lower leg, Back  Bathing assist Assist Level: Touching or steadying assistance(Pt > 75%)      Upper Body Dressing/Undressing Upper body dressing   What is the patient wearing?: Bra, Pull over shirt/dress Bra - Perfomed by patient: Thread/unthread right bra strap, Thread/unthread left bra strap Bra - Perfomed by helper: Hook/unhook bra (pull down sports bra) Pull over shirt/dress - Perfomed by patient: Thread/unthread right sleeve, Thread/unthread left sleeve, Put head through opening, Pull shirt over trunk          Upper body assist Assist Level: Touching or steadying assistance(Pt > 75%)      Lower Body Dressing/Undressing Lower body dressing   What is the patient wearing?: Underwear, Pants Underwear - Performed by patient: Thread/unthread left underwear leg, Pull underwear up/down Underwear - Performed by helper: Thread/unthread right underwear leg Pants- Performed by patient: Pull pants up/down, Thread/unthread left pants leg Pants- Performed by helper: Thread/unthread right pants leg   Non-skid slipper socks- Performed by helper: Don/doff right sock, Don/doff left sock               TED Hose - Performed by helper: Don/doff right TED hose, Don/doff left TED hose  Lower body assist Assist for lower body dressing:  Touching or steadying assistance (Pt > 75%)      Toileting Toileting Toileting activity did not occur: No continent bowel/bladder event Toileting steps completed by patient: Adjust clothing prior to toileting, Performs perineal hygiene, Adjust clothing after toileting Toileting steps completed by helper: Adjust clothing prior to toileting, Performs  perineal hygiene, Adjust clothing after toileting Toileting Assistive Devices: Grab bar or rail  Toileting assist Assist level: Supervision or verbal cues, Touching or steadying assistance (Pt.75%)   Transfers Chair/bed transfer   Chair/bed transfer method: Stand pivot Chair/bed transfer assist level: Touching or steadying assistance (Pt > 75%) Chair/bed transfer assistive device: Armrests, Medical sales representative Ambulation activity did not occur: Safety/medical concerns   Max distance: 100 Assist level: Touching or steadying assistance (Pt > 75%)   Wheelchair Wheelchair activity did not occur: Safety/medical concerns Type: Manual   Assist Level: Dependent (Pt equals 0%)  Cognition Comprehension Comprehension assist level: Understands basic 75 - 89% of the time/ requires cueing 10 - 24% of the time  Expression Expression assist level: Expresses basic 75 - 89% of the time/requires cueing 10 - 24% of the time. Needs helper to occlude trach/needs to repeat words.  Social Interaction Social Interaction assist level: Interacts appropriately 90% of the time - Needs monitoring or encouragement for participation or interaction.  Problem Solving Problem solving assist level: Solves basic 50 - 74% of the time/requires cueing 25 - 49% of the time, Solves basic 25 - 49% of the time - needs direction more than half the time to initiate, plan or complete simple activities  Memory Memory assist level: Recognizes or recalls 50 - 74% of the time/requires cueing 25 - 49% of the time, Recognizes or recalls 75 - 89% of the time/requires cueing 10 - 24% of the time   Medical Problem List and Plan:  1. Debility and encephalopathy secondary to ARDS/cardiogenic shock/PEA arrest /CAP/multi-medical-extubated 07/31/2017   Cont CIR 2. DVT Prophylaxis/Anticoagulation: hold given ABLA  3. Pain Management: Tylenol as needed  4. Mood/delirium/insomnia: Seroquel 66m QHS at 8:30pm   Atarax stopped on 5/27  due to possible contribution to confusion  Continue sleep-wake  chart    .-improved arousal and sleep-wake patterns 5. Neuropsych: This patient is not capable of making decisions on her own behalf.  6. Skin/Wound Care: keflex completed for left hand cellulitis 7. Fluids/Electrolytes/Nutrition: encourage PO  8. CAP/fevers. 8-day course of Merrem completed, afebrile at present  9. Dysphagia. Follow-up speech therapy   Encourage PO.   Dysphagia #2 thin liquids advanced to regular consistency diet on 5/29 10. Chronic systolic congestive heart failure.   ? Reliability Filed Weights   08/15/17 0427 08/16/17 0500 08/18/17 0602  Weight: 79.1 kg (174 lb 6.1 oz) 75.6 kg (166 lb 10.7 oz) 76.6 kg (168 lb 14 oz)  11. History of CAD. Continue aspirin and Plavix  12. CKD stage II.   Cr 0.99 5/28  Labs ordered for tomorrow  Cont to monitor 13. Hypertension. Norvasc 10 mg daily, Cozaar 100 mg daily.   Coreg 12.5 mg twice daily, increased to 25 on 5/24  Hydralazine 10 3 times a day started on 5/27   ? Trending up, will make further medication adjustments if necessary 14. Diabetes mellitus with peripheral neuropathy. Lantus insulin 15 units nightly.   Relatively controlled on 6/2, however fasting CBG pending 15. History of breast cancer. Continue arimidex  16.  Urinary frequency/odor:   Keflex completed 18. Leukocytosis: Resolved  WBCs 5.4 on 6/1 17. Acute  blood loss anemia  Hemoglobin 7.7 on 6/1  -iron studies indicated chronic disease component  -stool sample still not checked yet  -recheck cbc tomorrow  -stopped sq hep  -continue protonix  -no clinical signs of bleeding.   -continue serial labs.   18. Sleep disturbance  Melatonin added on 10/27 19. Hypokalemia  Potassium 3.4 on 5/28  Labs ordered for tomorrow  LOS (Days) 12 A FACE TO FACE EVALUATION WAS PERFORMED  Ankit Lorie Phenix, MD 08/18/2017 6:57 AM

## 2017-08-18 NOTE — Progress Notes (Signed)
Physical Therapy Session Note  Patient Details  Name: [name] MRN: [id number] Date of Birth: Oct 07, 1945  Today's Date: 08/18/2017 PT Individual Time: 1430-1530 PT Individual Time Calculation (min): 60 min   Short Term Goals: Week 2:  PT Short Term Goal 1 (Week 2): = LTG  Skilled Therapeutic Interventions/Progress Updates:    Functional transfers and mobility in room with RW for completing toileting prior to leaving room at supervision level including dynamic standing balance for clothing management and hand hygiene at sink. Outside, engaged in gait over uneven surfaces with RW for community mobility retraining and overall functional strengthening and endurance at supervision level including transfers to [employer]. Dual task cognition and functional dynamic standing balance activity while on compliant surface (foam mat and then red wedge) to challenge balance while performing pipe tree. Pt able to complete simple "+" shape puzzle with extra time and 1 questioning cue following picture, but for next level puzzle, pt required mod questioning cues and a choice between 2 pieces to make the puzzle connection with increased time (puzzle was the "+" with an extra level on top like "---"). Pt able to identify 1 of 3 differences in her final design vs the picture design.   Therapy Documentation Precautions:  Precautions Precautions: Fall Precaution Comments: fearful of falling Restrictions Weight Bearing Restrictions: No  Pain:  Denies pain.  See Function Navigator for Current Functional Status.   Therapy/Group: Individual Therapy  Canary Brim Ivory Broad, PT, DPT  08/18/2017, 3:37 PM

## 2017-08-18 NOTE — Plan of Care (Signed)
  Problem: Consults Goal: RH GENERAL PATIENT EDUCATION Description See Patient Education module for education specifics. Outcome: Progressing Goal: Skin Care Protocol Initiated - if Braden Score 18 or less Description If consults are not indicated, leave blank or document N/A Outcome: Progressing Goal: Nutrition Consult-if indicated Outcome: Progressing Goal: Diabetes Guidelines if Diabetic/Glucose > 140 Description If diabetic or lab glucose is > 140 mg/dl - Initiate Diabetes/Hyperglycemia Guidelines & Document Interventions  Outcome: Progressing   Problem: RH BOWEL ELIMINATION Goal: RH STG MANAGE BOWEL WITH ASSISTANCE Description STG Manage Bowel with Min Assistance.  Outcome: Progressing Goal: RH STG MANAGE BOWEL W/MEDICATION W/ASSISTANCE Description STG Manage Bowel with Medication with Force.  Outcome: Progressing   Problem: RH BLADDER ELIMINATION Goal: RH STG MANAGE BLADDER WITH ASSISTANCE Description STG Manage Bladder With Min Assistance  Outcome: Progressing Goal: RH STG MANAGE BLADDER WITH MEDICATION WITH ASSISTANCE Description STG Manage Bladder With Medication With [place].  Outcome: Progressing   Problem: RH SKIN INTEGRITY Goal: RH STG SKIN FREE OF INFECTION/BREAKDOWN Description Skin care with min assistance   Outcome: Progressing Goal: RH STG MAINTAIN SKIN INTEGRITY WITH ASSISTANCE Description STG Maintain Skin Integrity With [place].  Outcome: Progressing Goal: RH STG ABLE TO PERFORM INCISION/WOUND CARE W/ASSISTANCE Description STG Able To Perform Incision/Wound Care With [employer].  Outcome: Progressing   Problem: RH SAFETY Goal: RH STG ADHERE TO SAFETY PRECAUTIONS W/ASSISTANCE/DEVICE Description STG Adhere to Safety Precautions With Min Assistance/Device.  Outcome: Progressing Goal: RH STG DECREASED RISK OF FALL WITH ASSISTANCE Description STG Decreased Risk of Fall With [employer].  Outcome: Progressing    Problem: RH PAIN MANAGEMENT Goal: RH STG PAIN MANAGED AT OR BELOW PT'S PAIN GOAL Description Min assistance  Outcome: Progressing   Problem: RH KNOWLEDGE DEFICIT GENERAL Goal: RH STG INCREASE KNOWLEDGE OF SELF CARE AFTER HOSPITALIZATION Description Able to verbalize knowledge of disease processes and preventative measures of complications of hypertension, CAD, DM  Outcome: Progressing

## 2017-08-19 ENCOUNTER — Inpatient Hospital Stay (HOSPITAL_COMMUNITY): Payer: Medicare Other | Admitting: Occupational Therapy

## 2017-08-19 ENCOUNTER — Inpatient Hospital Stay (HOSPITAL_COMMUNITY): Payer: Medicare Other | Admitting: Speech Pathology

## 2017-08-19 ENCOUNTER — Inpatient Hospital Stay (HOSPITAL_COMMUNITY): Payer: Medicare Other | Admitting: Physical Therapy

## 2017-08-19 LAB — GLUCOSE, CAPILLARY
GLUCOSE-CAPILLARY: 121 mg/dL — AB (ref 65–99)
Glucose-Capillary: 80 mg/dL (ref 65–99)
Glucose-Capillary: 146 mg/dL — ABNORMAL HIGH (ref 65–99)
Glucose-Capillary: 113 mg/dL — ABNORMAL HIGH (ref 65–99)

## 2017-08-19 LAB — BASIC METABOLIC PANEL
Anion gap: 5 (ref 5–15)
BUN: 23 mg/dL — ABNORMAL HIGH (ref 6–20)
CHLORIDE: 114 mmol/L — AB (ref 101–111)
CO2: 21 mmol/L — AB (ref 22–32)
CREATININE: 1.08 mg/dL — AB (ref 0.44–1.00)
Calcium: 8.8 mg/dL — ABNORMAL LOW (ref 8.9–10.3)
GFR calc Af Amer: 59 mL/min — ABNORMAL LOW (ref >60)
GFR calc non Af Amer: 51 mL/min — ABNORMAL LOW (ref >60)
GLUCOSE: 80 mg/dL (ref 65–99)
Potassium: 3.4 mmol/L — ABNORMAL LOW (ref 3.5–5.1)
Sodium: 140 mmol/L (ref 135–145)

## 2017-08-19 LAB — CBC
HCT: 24 % — ABNORMAL LOW (ref 36.0–46.0)
HEMOGLOBIN: 7.4 g/dL — AB (ref 12.0–15.0)
MCH: 25.3 pg — AB (ref 26.0–34.0)
MCHC: 30.8 g/dL (ref 30.0–36.0)
MCV: 82.2 fL (ref 78.0–100.0)
Platelets: 331 10*3/uL (ref 150–400)
RBC: 2.92 MIL/uL — ABNORMAL LOW (ref 3.87–5.11)
RDW: 18.4 % — AB (ref 11.5–15.5)
WBC: 5.6 10*3/uL (ref 4.0–10.5)

## 2017-08-19 MED ORDER — POTASSIUM CHLORIDE CRYS ER 20 MEQ PO TBCR
20.0000 meq | EXTENDED_RELEASE_TABLET | Freq: Every day | ORAL | Status: DC
  Administered 2017-08-19 – 2017-08-22 (×4): 20 meq via ORAL
  Filled 2017-08-19 (×4): qty 1

## 2017-08-19 NOTE — Progress Notes (Signed)
Speech Language Pathology Daily Session Note  Patient Details  Name: [name] MRN: [id number] Date of Birth: November 29, 1945  Today's Date: 08/19/2017 SLP Individual Time: 1749-4496 SLP Individual Time Calculation (min): 45 min  Short Term Goals: Week 2: SLP Short Term Goal 1 (Week 2): Patient will orient to place, time and situation with Mod A multimodal cues.  SLP Short Term Goal 2 (Week 2): Patient will recall daily information with use of external aids with Mod A verbal cues.  SLP Short Term Goal 3 (Week 2): Patient will demonstrate functional problem solving with basic and familiar tasks with Mod A verbal cues.  SLP Short Term Goal 4 (Week 2): Patient will demonstrate sustained attention to functional tasks for 10 minutes with Mod A verbal cues for redirection.  SLP Short Term Goal 5 (Week 2): Patient will verbally respond to questions in 100% of opportunities with Min A verbal cues.  SLP Short Term Goal 6 (Week 2): Patient will consume current diet with minimal overt s/s of aspiration with Mod I for use of swallowing strategies.   Skilled Therapeutic Interventions: Skilled treatment session focused on cognitive goals. Upon arrival, patient was upright in the wheelchair. SLP facilitated session by providing Min A verbal cues for patient to initiate tray set-up with breakfast meal of regular textures with thin liquids. Patient alternated attention between self-feeding and a functional conversation for ~20 minutes with Min A verbal cues. Patient independently recalled the date but required Min A verbal cues for use of memory notebook to record information to maximize recall. Patient left upright in wheelchair with all needs within reach and quick release belt in place. Continue with current plan of care.      Function:   Cognition Comprehension Comprehension assist level: Understands basic 75 - 89% of the time/ requires cueing 10 - 24% of the time  Expression   Expression assist level:  Expresses basic 75 - 89% of the time/requires cueing 10 - 24% of the time. Needs helper to occlude trach/needs to repeat words.  Social Interaction Social Interaction assist level: Interacts appropriately 90% of the time - Needs monitoring or encouragement for participation or interaction.  Problem Solving Problem solving assist level: Solves basic 50 - 74% of the time/requires cueing 25 - 49% of the time  Memory Memory assist level: Recognizes or recalls 50 - 74% of the time/requires cueing 25 - 49% of the time    Pain No/Denies Pain   Therapy/Group: Individual Therapy  Braden Deloach 08/19/2017, 3:24 PM

## 2017-08-19 NOTE — Progress Notes (Signed)
Physical Therapy Note  Patient Details  Name: [name] MRN: [id number] Date of Birth: December 12, 1945 Today's Date: 08/19/2017    Time: 830-855 25 minutes  1:1 No c/o pain.  Pt states need to toilet. Pt performs gait in room with RW, all transfers, toileting and toilet transfers all with supervision.  Pt then able to perform gait with RW with distant supervision x 150', then states need to toilet again.  Pt performs all transfers again with supervision, left on toilet with nursing present.   Graysin Luczynski 08/19/2017, 8:59 AM

## 2017-08-19 NOTE — Progress Notes (Signed)
Occupational Therapy Session Note  Patient Details  Name: [name] MRN: [id number] Date of Birth: [birth year]  Today's Date: 08/19/2017 OT Individual Time: 1136-1202 and 2023-3435 OT Individual Time Calculation (min): 26 min and 72 min  Short Term Goals: Week 2:  OT Short Term Goal 1 (Week 2): STGs=LTGS secondary to upcoming discharge date  Skilled Therapeutic Interventions/Progress Updates:    Pt greeted in w/c. No c/o pain and ADL needs met. Worked on UB strengthening this session. Provided her with 3# bar and guided her through exercises including bicep curls, straight arm raises, forward/backward rows, and chest presses x10 reps. She was left with all needs within reach and safety belt fastened.   2nd Session 1:1 tx (72 min) Pt greeted seated in w/c, requesting to go outdoors. Once she was escorted via w/c, worked on problem solving, memory, and visual scanning during novel game of Scrabble, and word search puzzle. She required min vcs for meeting new learning demands and pathfinding way back to unit afterwards. When back in room, pt utilizing seated figure 4 to remove gripper socks, apply lotion to feet, and don socks/shoes. Pt able to fasten laces herself as well. We recorded therapy events in memory book and reviewed therapeutic activities from the weekend. Pt left with all needs within reach and visitor present.   Therapy Documentation Precautions:  Precautions Precautions: Fall Precaution Comments: fearful of falling Restrictions Weight Bearing Restrictions: No General: General PT Missed Treatment Reason: Toileting Pain: No c/o pain during session    ADL:      See Function Navigator for Current Functional Status.   Therapy/Group: Individual Therapy  Kelsea Mousel A Carmeron Heady 08/19/2017, 12:47 PM

## 2017-08-19 NOTE — Progress Notes (Signed)
[facility] PHYSICAL MEDICINE & REHABILITATION     PROGRESS NOTE    Subjective/Complaints: Up eating breakfast. Has good appetite despite dentures ill-fitting.   ROS: Patient denies fever, rash, sore throat, blurred vision, nausea, vomiting, diarrhea, cough, shortness of breath or chest pain, joint or back pain, headache, or mood change.   Objective:  No results found. Recent Labs    08/19/17 0453  WBC 5.6  HGB 7.4*  HCT 24.0*  PLT 331   Recent Labs    08/19/17 0453  NA 140  K 3.4*  CL 114*  GLUCOSE 80  BUN 23*  CREATININE 1.08*  CALCIUM 8.8*   CBG (last 3)  Recent Labs    08/18/17 1709 08/18/17 2135 08/19/17 0622  GLUCAP 133* 89 80    Wt Readings from Last 3 Encounters:  08/19/17 79.4 kg (175 lb 0.7 oz)  08/06/17 81.7 kg (180 lb 1.9 oz)  06/19/17 87.1 kg (192 lb 1.6 oz)     Intake/Output Summary (Last 24 hours) at 08/19/2017 0855 Last data filed at 08/19/2017 0444 Gross per 24 hour  Intake 380 ml  Output -  Net 380 ml    Vital Signs: Blood pressure (!) 143/60, pulse 86, temperature 98.7 F (37.1 C), temperature source Oral, resp. rate 16, height 5' 4"  (1.626 m), weight 79.4 kg (175 lb 0.7 oz), SpO2 99 %. Physical Exam:  Constitutional: No distress . Vital signs reviewed. HEENT: EOMI, oral membranes moist Neck: supple Cardiovascular: RRR without murmur. No JVD    Respiratory: CTA Bilaterally without wheezes or rales. Normal effort    GI: BS +, non-tender, non-distended  Musc: No edema or tenderness in extremities. Neurological:  Alert   Motor: RUE: 4/5 proximal to distal  LUE: shoulder abduction, elbow flex/ext 4/5, hand grip 4/5  --shies away from using left hand B/l LE: HF 4/5, KE 4/5, ADF 4/5 (stable)  Psychiatric: cooperative  Assessment/Plan: 1. Functional deficits secondary to debility/encephalopathy which require 3+ hours per day of interdisciplinary therapy in a comprehensive inpatient rehab setting. Physiatrist is providing close team  supervision and 24 hour management of active medical problems listed below. Physiatrist and rehab team continue to assess barriers to discharge/monitor patient progress toward functional and medical goals.  Function:  Bathing Bathing position Bathing activity did not occur: Refused Position: Wheelchair/chair at sink  Bathing parts Body parts bathed by patient: Chest, Right arm, Left arm, Abdomen, Front perineal area, Left upper leg, Right upper leg, Buttocks Body parts bathed by helper: Right lower leg, Left lower leg, Back  Bathing assist Assist Level: Touching or steadying assistance(Pt > 75%)      Upper Body Dressing/Undressing Upper body dressing   What is the patient wearing?: Bra, Pull over shirt/dress Bra - Perfomed by patient: Thread/unthread right bra strap, Thread/unthread left bra strap Bra - Perfomed by helper: Hook/unhook bra (pull down sports bra) Pull over shirt/dress - Perfomed by patient: Thread/unthread right sleeve, Thread/unthread left sleeve, Put head through opening, Pull shirt over trunk          Upper body assist Assist Level: Touching or steadying assistance(Pt > 75%)      Lower Body Dressing/Undressing Lower body dressing   What is the patient wearing?: Underwear, Pants Underwear - Performed by patient: Thread/unthread left underwear leg, Pull underwear up/down Underwear - Performed by helper: Thread/unthread right underwear leg Pants- Performed by patient: Pull pants up/down, Thread/unthread left pants leg Pants- Performed by helper: Thread/unthread right pants leg   Non-skid slipper socks- Performed by  helper: Don/doff right sock, Don/doff left sock               TED Hose - Performed by helper: Don/doff right TED hose, Don/doff left TED hose  Lower body assist Assist for lower body dressing: Touching or steadying assistance (Pt > 75%)      Toileting Toileting Toileting activity did not occur: No continent bowel/bladder event Toileting steps  completed by patient: Adjust clothing prior to toileting, Performs perineal hygiene, Adjust clothing after toileting Toileting steps completed by helper: Adjust clothing prior to toileting, Performs perineal hygiene, Adjust clothing after toileting Toileting Assistive Devices: Grab bar or rail  Toileting assist Assist level: Supervision or verbal cues   Transfers Chair/bed transfer   Chair/bed transfer method: Stand pivot Chair/bed transfer assist level: Touching or steadying assistance (Pt > 75%) Chair/bed transfer assistive device: Armrests     Locomotion Ambulation Ambulation activity did not occur: Safety/medical concerns   Max distance: 150' Assist level: Supervision or verbal cues   Wheelchair Wheelchair activity did not occur: Safety/medical concerns Type: Manual   Assist Level: Dependent (Pt equals 0%)  Cognition Comprehension Comprehension assist level: Understands basic 75 - 89% of the time/ requires cueing 10 - 24% of the time  Expression Expression assist level: Expresses basic 75 - 89% of the time/requires cueing 10 - 24% of the time. Needs helper to occlude trach/needs to repeat words.  Social Interaction Social Interaction assist level: Interacts appropriately 90% of the time - Needs monitoring or encouragement for participation or interaction.  Problem Solving Problem solving assist level: Solves basic 50 - 74% of the time/requires cueing 25 - 49% of the time, Solves basic 25 - 49% of the time - needs direction more than half the time to initiate, plan or complete simple activities  Memory Memory assist level: Recognizes or recalls 50 - 74% of the time/requires cueing 25 - 49% of the time, Recognizes or recalls 75 - 89% of the time/requires cueing 10 - 24% of the time   Medical Problem List and Plan:  1. Debility and encephalopathy secondary to ARDS/cardiogenic shock/PEA arrest /CAP/multi-medical-extubated 07/31/2017   Cont CIR 2. DVT Prophylaxis/Anticoagulation: hold  given ABLA  3. Pain Management: Tylenol as needed  4. Mood/delirium/insomnia: Seroquel 67m QHS at 8:30pm   Atarax stopped on 5/27 due to possible contribution to confusion  Continue sleep-wake  chart    .-improved arousal and sleep-wake patterns 5. Neuropsych: This patient is not capable of making decisions on her own behalf.  6. Skin/Wound Care: keflex completed for left hand cellulitis 7. Fluids/Electrolytes/Nutrition: encourage PO  8. CAP/fevers. 8-day course of Merrem completed, afebrile at present  9. Dysphagia. Follow-up speech therapy   Encourage PO.   Dysphagia #2 thin liquids advanced to regular consistency diet on 5/29 10. Chronic systolic congestive heart failure.   ? Reliability---no signs of fluid overload on exam---baseline appears to be around 80kg Filed Weights   08/16/17 0500 08/18/17 0602 08/19/17 0501  Weight: 75.6 kg (166 lb 10.7 oz) 76.6 kg (168 lb 14 oz) 79.4 kg (175 lb 0.7 oz)  11. History of CAD. Continue aspirin and Plavix  12. CKD stage II.   Cr 0.99 5/28---1.08 6/3  I personally reviewed the patient's labs today.    Cont to monitor 13. Hypertension. Norvasc 10 mg daily, Cozaar 100 mg daily.   Coreg 12.5 mg twice daily, increased to 25 on 5/24  Hydralazine 10 3 times a day started on 5/27   Overall BP improved and reasonable  at present 6/3 14. Diabetes mellitus with peripheral neuropathy. Lantus insulin 15 units nightly.   Relatively controlled on 6/3  15. History of breast cancer. Continue arimidex  16.  Urinary frequency/odor:   Keflex completed 18. Leukocytosis: Resolved  WBCs 5.6 on 6/3 17. Acute blood loss anemia  Hemoglobin 7.4 on 6/1  -iron studies indicated chronic disease component  -stool sample never checked  -stopped sq hep  -continue protonix  -no clinical signs of bleeding.   -continue serial labs.   18. Sleep disturbance  Melatonin added on 10/27 19. Hypokalemia  Potassium 3.4 on 5/28 and again on 6/3  supplement  LOS (Days)  [place] EVALUATION WAS PERFORMED  Meredith Staggers, MD 08/19/2017 8:55 AM

## 2017-08-20 ENCOUNTER — Encounter (HOSPITAL_COMMUNITY): Payer: Medicare Other | Admitting: Psychology

## 2017-08-20 ENCOUNTER — Inpatient Hospital Stay (HOSPITAL_COMMUNITY): Payer: Medicare Other | Admitting: Speech Pathology

## 2017-08-20 ENCOUNTER — Inpatient Hospital Stay (HOSPITAL_COMMUNITY): Payer: Medicare Other | Admitting: Physical Therapy

## 2017-08-20 ENCOUNTER — Inpatient Hospital Stay (HOSPITAL_COMMUNITY): Payer: Medicare Other | Admitting: Occupational Therapy

## 2017-08-20 LAB — GLUCOSE, CAPILLARY
GLUCOSE-CAPILLARY: 84 mg/dL (ref 65–99)
Glucose-Capillary: 136 mg/dL — ABNORMAL HIGH (ref 65–99)
Glucose-Capillary: 135 mg/dL — ABNORMAL HIGH (ref 65–99)
Glucose-Capillary: 125 mg/dL — ABNORMAL HIGH (ref 65–99)

## 2017-08-20 MED ORDER — FAMOTIDINE 20 MG PO TABS
20.0000 mg | ORAL_TABLET | Freq: Every day | ORAL | Status: DC
  Administered 2017-08-21 – 2017-08-22 (×2): 20 mg via ORAL
  Filled 2017-08-20 (×2): qty 1

## 2017-08-20 NOTE — Progress Notes (Signed)
Physical Therapy Session Note  Patient Details  Name: [name] MRN: [id number] Date of Birth: [birth year]  Today's Date: 08/20/2017 PT Individual Time: 1300-1328 PT Individual Time Calculation (min): 28 min   Short Term Goals: Week 2:  PT Short Term Goal 1 (Week 2): = LTG  Skilled Therapeutic Interventions/Progress Updates:    Pt performed Berg balance test and scored 37/56. Pt aware of high risk for falls and states she plans to use RW "all the time".  Pt performs gait training with focus on sidestepping for navigating tight spaces with RW with supervision. Pt left in recliner with needs at hand, alarm set, family present.  Therapy Documentation Precautions:  Precautions Precautions: Fall Precaution Comments: fearful of falling Restrictions Weight Bearing Restrictions: Yes(ristricted right arm) Pain: no c/o pain    Balance: Standardized Balance Assessment Standardized Balance Assessment: Berg Balance Test Berg Balance Test Sit to Stand: Able to stand without using hands and stabilize independently Standing Unsupported: Able to stand 2 minutes with supervision Sitting with Back Unsupported but Feet Supported on Floor or Stool: Able to sit safely and securely 2 minutes Stand to Sit: Sits safely with minimal use of hands Transfers: Able to transfer safely, minor use of hands Standing Unsupported with Eyes Closed: Able to stand 10 seconds with supervision Standing Ubsupported with Feet Together: Able to place feet together independently and stand for 1 minute with supervision From Standing, Reach Forward with Outstretched Arm: Can reach forward >5 cm safely (2") From Standing Position, Pick up Object from Floor: Able to pick up shoe, needs supervision From Standing Position, Turn to Look Behind Over each Shoulder: Turn sideways only but maintains balance Turn 360 Degrees: Able to turn 360 degrees safely but slowly Standing Unsupported, Alternately Place Feet on Step/Stool: Able  to complete >2 steps/needs minimal assist Standing Unsupported, One Foot in Front: Able to take small step independently and hold 30 seconds Standing on One Leg: Unable to try or needs assist to prevent fall Total Score: 37    Therapy/Group: Individual Therapy  Addasyn Mcbreen 08/20/2017, 1:15 PM

## 2017-08-20 NOTE — Progress Notes (Signed)
Physical Therapy Session Note  Patient Details  Name: [name] MRN: [id number] Date of Birth: 08/12/1945  Today's Date: 08/20/2017 PT Individual Time: 0930-1000 PT Individual Time Calculation (min): 30 min   Short Term Goals: Week 2:  PT Short Term Goal 1 (Week 2): = LTG  Skilled Therapeutic Interventions/Progress Updates:   Pt in w/c and agreeable to therapy, no c/o pain. Total assist w/c transport to/from therapy gym for time management. Session focused on functional mobility and functional balance tasks. Worked on cognitive task in standing requiring initiation, motor planning, and attention to task. Required only 2-3 verbal cues for performing of task. Encouraged pt to not rely on UE support to maintain static standing balance for carryover to standing for functional tasks such as at sink or countertop. Pt reporting she needed to emergently go to bathroom. Transported back to room and ambulated to/from toilet w/ supervision, supervision for pericare and to stand at sink and wash hands. Pt w/ continent BM. Pt reporting needing to go again and returned to toilet to have another continent BM. Ended session in recliner, call bell within reach and all needs met. Quick release belt and chair alarm engaged. Min cues to write in memory book.   Therapy Documentation Precautions:  Precautions Precautions: Fall Precaution Comments: fearful of falling Restrictions Weight Bearing Restrictions: Yes(ristricted right arm) Vital Signs:  Pain:    See Function Navigator for Current Functional Status.   Therapy/Group: Individual Therapy  Jaleesa Cervi K Arnette 08/20/2017, 10:00 AM

## 2017-08-20 NOTE — Consult Note (Signed)
Neuropsychological Consultation   Patient:   [name]   DOB:   [birth year]  MR Number:  [id number]  Location:  [facility] [id number] [place] [address] Dept: [phone] GEX: [phone]           Date of Service:    08/20/2017  Start Time:   2:30 PM End Time:   3:30 PM  Provider/Observer:  Ilean Skill, Psy.D.       Clinical Neuropsychologist       Billing Code/Service: [phone] 4 Units  Chief Complaint:    [name] is a 72 year old female with a history of CAD maintained on aspirin and Plavix.  The patient also has history of breast cancer, diabetes, chronic systolic congestive heart failure, CKD stage II.  The patient presented on 07/28/2016 with progressive cough, chest pain and hypoxia.  She was found to be septic with a large left lung infiltration and treated with antibiotics.  The patient had bouts of delirium and agitation but has continued to improve.  The patient was admitted to the comprehensive inpatient rehabilitative program due to debility and ongoing cognitive and physical limitations.  The patient continues to have significant memory deficits for the time around her acute illness and initial hospitalization.  The patient is actively having speech therapy work on [employer] and memory training.  The patient's ability for learning new information currently appears to have significantly improved but she continues to have deficits for the time around her acute illness.  Reason for Service:  The patient was referred for neuropsychological consult due to ongoing memory issues and concerned about the degree with which these memory issues are related to anoxia/hypoxia.  Below is the HPI for the current admission.  HPI: [name] is a 72 year old right-handed female with history of CAD maintained on aspirin and Plavix, breast cancer maintained on arimidex, diabetes mellitus,  chronic systolic congestive heart failure, CKD stage II. Per chart review patient lives with her daughter. Independent prior to admission. Presented 07/23/2016 with progressive cough, chest pain and hypoxia. She was found to be septic with large left lung infiltration started on IV antibiotic coverage for CAP. She had a brief PEA arrest post bronchoscopy revealing alveolar hemorrhage. Hospital course significant for progressive shortness of breath requiring intubation, cardiogenic shock requiring pressors, ARDS with acute blood loss anemia and progressive leukocytosis with fevers. Infectious disease consulted for input and recommended 8-day course of Merrem to treat aspiration event. Fevers did resolve tolerated extubation 07/31/2017. She did have bouts of delirium and agitation and continued to improve. Subcutaneous heparin for DVT prophylaxis. Currently maintained on a dysphagia #2 thin liquid diet. Physical and occupational therapy evaluations completed with recommendations of physical medicine rehab consult. Patient was admitted for a comprehensive rehab program.   Current Status:  During the clinical interview today, the patient was clearly able to recount numerous cognitive tasks and rehabilitative efforts that have been done with the speech therapist today.  She was able to accurately report that her daughter was here yesterday but not today and that her daughter would be coming tomorrow.  The patient is aware that she has very little if any memory for the time around where she had her most acute illness.  This is very typical given how sick she was.  While the patient was clearly encephalopathic during that time she does appear to have made significant improvement as far as overall memory and  cognitive functioning.  While it may not of return to this point to baseline functioning she is showing ongoing improvement and clearly benefiting from cognitive rehab efforts being provided through speech therapy.  The  patient denied any significant mood disturbance and denied any significant symptoms of depression or anxiety.  She does appear to be coping well even though appropriately she is stressed and tired of her long hospital care.  Behavioral Observation: [name]  presents as a 72 y.o.-year-old Right African American Female who appeared her stated age. her dress was Appropriate and she was Well Groomed and her manners were Appropriate to the situation.  her participation was indicative of Appropriate and Attentive behaviors.  There were any physical disabilities noted.  she displayed an appropriate level of cooperation and motivation.     Interactions:    Active Appropriate and Attentive  Attention:   within normal limits and attention span and concentration were age appropriate  Memory:   abnormal; recent memory intact, remote memory impaired  Visuo-spatial:  not examined  Speech (Volume):  low  Speech:   normal; normal  Thought Process:  Coherent and Relevant  Though Content:  WNL; not suicidal and not homicidal  Orientation:   person, place, time/date and situation  Judgment:   Fair  Planning:   Fair  Affect:    Flat  Mood:    Dysphoric  Insight:   Fair  Intelligence:   normal  Medical History:   Past Medical History:  Diagnosis Date  . Abdominal discomfort    Chronic N/V/D. Presumptive dx Crohn's dx per elevated p ANCA. Failed Entocort and Pentasa. Sep 2003 - ileocolectomy c anastomosis per Dr Deon Pilling 2/2 adhesions - path was hegative for Crohns. EGD, Sm bowel follow through (11/03), and an eteroclysis (10/03) were unrevealing. Cuases hypomag and hypocalcemia.  . Adnexal mass 8/03   s/p lap BSO (R ovarian fibroma) & lysis of adhesions  . Allergy    Seasonal  . Anemia    Multifactorial. Baseline HgB 10-11 ish. B12 def - 150 in 3/10. Fe Def - ferritin 35 3/10. Both are being repleted.  . Breast cancer ([place]) 03/16/13   right, 5 o'clock  . CAD (coronary artery disease)  1996   1996 - PTCA and angioplasty diagonal branch. 2000 - Rotoblator & angiopllasty of diagonal. 2006 - subendocardial AMI, DES to proximal LAD.Marland Kitchen Also had 90% stenosis in distal apical LAD. EF 55 with apical hypokinesis. Indefinite ASA and Plavix.  . CHF (congestive heart failure) ([place])   . Chronic kidney disease    Chronic renal insuff baseline Cr 1.2 - 1.4 ish.  . Chronic pain    CT 10/10 = Spinal stenosis L2 - S1.  . Diabetes mellitus    Insulin dependent  . Hx of radiation therapy 06/02/13- 07/16/13   right rbeast 4500 cGy 25 sessions, right breast boost 1600 cGy in 8 sessions  . Hyperlipidemia    Managed with both a statin and Welchol. Welchol stopped 2014 2/2 cost and started on fenofibrate   . Hypertension    2006 B renal arteries patent. 2003 MRA - no RAS. 2003 pheo W/U Dr Hassell Done reportedly negative.  . Hypoxia 07/23/2017  . Lupus ([place])   . Personal history of radiation therapy   . RBBB   . Stroke [facility])    Incidental finding MRI 2002 L lacunar infarct  . Wears dentures    top         Psychiatric History:  No prior psychiatric  history  Family Med/Psych History:  Family History  Problem Relation Age of Onset  . Pancreatic cancer Brother 69  . Cancer Mother   . Breast cancer Mother 53  . Lung cancer Maternal Aunt   . Breast cancer Maternal Aunt   . Prostate cancer Maternal Uncle   . Heart attack Maternal Grandmother   . Breast cancer Maternal Grandmother   . Kidney failure Maternal Aunt   . Hypertension Daughter     Risk of Suicide/Violence: low the patient denies any suicidal or homicidal ideation.  Impression/DX:  [name] is a 72 year old female with a history of CAD maintained on aspirin and Plavix.  The patient also has history of breast cancer, diabetes, chronic systolic congestive heart failure, CKD stage II.  The patient presented on 07/28/2016 with progressive cough, chest pain and hypoxia.  She was found to be septic with a large left lung infiltration and  treated with antibiotics.  The patient had bouts of delirium and agitation but has continued to improve.  The patient was admitted to the comprehensive inpatient rehabilitative program due to debility and ongoing cognitive and physical limitations.  The patient continues to have significant memory deficits for the time around her acute illness and initial hospitalization.  The patient is actively having speech therapy work on [employer] and memory training.  The patient's ability for learning new information currently appears to have significantly improved but she continues to have deficits for the time around her acute illness.  During the clinical interview today, the patient was clearly able to recount numerous cognitive tasks and rehabilitative efforts that have been done with the speech therapist today.  She was able to accurately report that her daughter was here yesterday but not today and that her daughter would be coming tomorrow.  The patient is aware that she has very little if any memory for the time around where she had her most acute illness.  This is very typical given how sick she was.  While the patient was clearly encephalopathic during that time she does appear to have made significant improvement as far as overall memory and cognitive functioning.  While it may not of return to this point to baseline functioning she is showing ongoing improvement and clearly benefiting from cognitive rehab efforts being provided through speech therapy.  The patient denied any significant mood disturbance and denied any significant symptoms of depression or anxiety.  She does appear to be coping well even though appropriately she is stressed and tired of her long hospital care.  Disposition/Plan:  I will follow-up with the patient as needed particularly if ongoing issues persist after her discharge from the inpatient unit.  Also, I will try to make myself available tomorrow to answer any questions for the  patient's daughter when the daughter is here on the unit as expected.         Electronically Signed   _______________________ Ilean Skill, Psy.D.

## 2017-08-20 NOTE — Progress Notes (Signed)
Resting comfortably throughout shift, easily aroused, respiration even and steady, Left hand elevated on pillows to reduce swelling, Denies pain,Continue monitoring while on sleep chart per order, Bed alarm and safety monitoring continue

## 2017-08-20 NOTE — Progress Notes (Signed)
[name] PHYSICAL MEDICINE & REHABILITATION     PROGRESS NOTE    Subjective/Complaints: No complaints. Anxious to get home.   ROS: Patient denies fever, rash, sore throat, blurred vision, nausea, vomiting, diarrhea, cough, shortness of breath or chest pain, joint or back pain, headache, or mood change. .   Objective:  No results found. Recent Labs    08/19/17 0453  WBC 5.6  HGB 7.4*  HCT 24.0*  PLT 331   Recent Labs    08/19/17 0453  NA 140  K 3.4*  CL 114*  GLUCOSE 80  BUN 23*  CREATININE 1.08*  CALCIUM 8.8*   CBG (last 3)  Recent Labs    08/19/17 1651 08/19/17 2106 08/20/17 0626  GLUCAP 113* 121* 84    Wt Readings from Last 3 Encounters:  08/20/17 79.4 kg (175 lb 0.7 oz)  08/06/17 81.7 kg (180 lb 1.9 oz)  06/19/17 87.1 kg (192 lb 1.6 oz)    No intake or output data in the 24 hours ending 08/20/17 0854  Vital Signs: Blood pressure (!) 155/64, pulse 79, temperature 98.4 F (36.9 C), temperature source Oral, resp. rate 17, height 5' 4"  (1.626 m), weight 79.4 kg (175 lb 0.7 oz), SpO2 98 %. Physical Exam:  Constitutional: No distress . Vital signs reviewed. HEENT: EOMI, oral membranes moist Neck: supple Cardiovascular: RRR without murmur. No JVD    Respiratory: CTA Bilaterally without wheezes or rales. Normal effort    GI: BS +, non-tender, non-distended  Musc: No edema or tenderness in extremities. Neurological:  Alert   Motor: RUE: 4/5 proximal to distal  LUE: shoulder abduction, elbow flex/ext 4/5, hand grip 4/5  --shies away from using left hand B/l LE: HF 4/5, KE 4/5, ADF 4/5 (stable exam)  Psychiatric: cooperative  Assessment/Plan: 1. Functional deficits secondary to debility/encephalopathy which require 3+ hours per day of interdisciplinary therapy in a comprehensive inpatient rehab setting. Physiatrist is providing close team supervision and 24 hour management of active medical problems listed below. Physiatrist and rehab team continue to  assess barriers to discharge/monitor patient progress toward functional and medical goals.  Function:  Bathing Bathing position Bathing activity did not occur: Refused Position: Wheelchair/chair at sink  Bathing parts Body parts bathed by patient: Chest, Right arm, Left arm, Abdomen, Front perineal area, Left upper leg, Right upper leg, Buttocks Body parts bathed by helper: Right lower leg, Left lower leg, Back  Bathing assist Assist Level: Touching or steadying assistance(Pt > 75%)      Upper Body Dressing/Undressing Upper body dressing   What is the patient wearing?: Bra, Pull over shirt/dress Bra - Perfomed by patient: Thread/unthread right bra strap, Thread/unthread left bra strap Bra - Perfomed by helper: Hook/unhook bra (pull down sports bra) Pull over shirt/dress - Perfomed by patient: Thread/unthread right sleeve, Thread/unthread left sleeve, Put head through opening, Pull shirt over trunk          Upper body assist Assist Level: Touching or steadying assistance(Pt > 75%)      Lower Body Dressing/Undressing Lower body dressing   What is the patient wearing?: Non-skid slipper socks, Socks, Shoes Underwear - Performed by patient: Thread/unthread left underwear leg, Pull underwear up/down Underwear - Performed by helper: Thread/unthread right underwear leg Pants- Performed by patient: Pull pants up/down, Thread/unthread left pants leg Pants- Performed by helper: Thread/unthread right pants leg Non-skid slipper socks- Performed by patient: Don/doff right sock, Don/doff left sock Non-skid slipper socks- Performed by helper: Don/doff right sock, Don/doff left sock Socks -  Performed by patient: Don/doff right sock, Don/doff left sock   Shoes - Performed by patient: Don/doff right shoe, Don/doff left shoe, Fasten right, Fasten left         TED Hose - Performed by helper: Don/doff right TED hose, Don/doff left TED hose  Lower body assist Assist for lower body dressing:  Supervision or verbal cues      Toileting Toileting Toileting activity did not occur: No continent bowel/bladder event Toileting steps completed by patient: Adjust clothing prior to toileting, Performs perineal hygiene, Adjust clothing after toileting Toileting steps completed by helper: Adjust clothing prior to toileting, Performs perineal hygiene, Adjust clothing after toileting Toileting Assistive Devices: Grab bar or rail  Toileting assist Assist level: Supervision or verbal cues   Transfers Chair/bed transfer   Chair/bed transfer method: Stand pivot Chair/bed transfer assist level: Touching or steadying assistance (Pt > 75%) Chair/bed transfer assistive device: Armrests     Locomotion Ambulation Ambulation activity did not occur: Safety/medical concerns   Max distance: 150' Assist level: Supervision or verbal cues   Wheelchair Wheelchair activity did not occur: Safety/medical concerns Type: Manual   Assist Level: Dependent (Pt equals 0%)  Cognition Comprehension Comprehension assist level: Understands basic 75 - 89% of the time/ requires cueing 10 - 24% of the time  Expression Expression assist level: Expresses basic 75 - 89% of the time/requires cueing 10 - 24% of the time. Needs helper to occlude trach/needs to repeat words.  Social Interaction Social Interaction assist level: Interacts appropriately 90% of the time - Needs monitoring or encouragement for participation or interaction.  Problem Solving Problem solving assist level: Solves basic 50 - 74% of the time/requires cueing 25 - 49% of the time  Memory Memory assist level: Recognizes or recalls 50 - 74% of the time/requires cueing 25 - 49% of the time   Medical Problem List and Plan:  1. Debility and encephalopathy secondary to ARDS/cardiogenic shock/PEA arrest /CAP/multi-medical-extubated 07/31/2017   Cont CIR. Team conf today 2. DVT Prophylaxis/Anticoagulation: hold given ABLA  3. Pain Management: Tylenol as needed   4. Mood/delirium/insomnia: Seroquel 12m QHS at 8:30pm   Atarax stopped on 5/27 due to possible contribution to confusion  Can dc sleep-wake  chart     5. Neuropsych: This patient is not capable of making decisions on her own behalf.  6. Skin/Wound Care: keflex completed for left hand cellulitis 7. Fluids/Electrolytes/Nutrition: encourage PO  8. CAP/fevers. 8-day course of Merrem completed, afebrile at present  9. Dysphagia. Follow-up speech therapy   Encourage PO.     advanced to regular consistency diet on 5/29 10. Chronic systolic congestive heart failure.   ? Reliability---no signs of fluid overload on exam---baseline appears to be around 80kg Filed Weights   08/18/17 0602 08/19/17 0501 08/20/17 0444  Weight: 76.6 kg (168 lb 14 oz) 79.4 kg (175 lb 0.7 oz) 79.4 kg (175 lb 0.7 oz)  11. History of CAD. Continue aspirin and Plavix  12. CKD stage II.   Cr 0.99 5/28---1.08 6/3   y.    Cont to monitor 13. Hypertension. Norvasc 10 mg daily, Cozaar 100 mg daily.   Coreg 12.5 mg twice daily, increased to 25 on 5/24  Hydralazine 10 3 times a day started on 5/27   Overall BP improved and reasonable at present 6/3 14. Diabetes mellitus with peripheral neuropathy. Lantus insulin 15 units nightly.   Relatively controlled on 6/3  15. History of breast cancer. Continue arimidex  16.  Urinary frequency/odor:   Keflex  completed 18. Leukocytosis: Resolved  WBCs 5.6 on 6/3 17. Acute blood loss anemia  Hemoglobin 7.4 on 6/3  -iron studies indicated chronic disease component  -stool sample never checked  -stopped sq hep  -continue protonix  -no clinical signs of bleeding.   -recheck once more tomorrow  -remove central line pending tomorrow's blood    18. Sleep disturbance  Melatonin added on 10/27 19. Hypokalemia  Potassium 3.4 on 5/28 and again on 6/3  Continue supplement  LOS (Days) [place] EVALUATION WAS PERFORMED  Meredith Staggers, MD 08/20/2017 8:54 AM

## 2017-08-20 NOTE — Progress Notes (Signed)
Occupational Therapy Session Note  Patient Details  Name: [name] MRN: [id number] Date of Birth: [birth year]  Today's Date: 08/20/2017 OT Individual Time: 7915-0569 OT Individual Time Calculation (min): 72 min    Short Term Goals: Week 2:  OT Short Term Goal 1 (Week 2): STGs=LTGS secondary to upcoming discharge date  Skilled Therapeutic Interventions/Progress Updates:   Upon entering the room, pt seated in wheelchair awaiting breakfast with no c/o pain this session. Tray arrived and pt ate meal with full supervision for safety. No coughing noted and pt taking small bites. Pt requesting to shower this session and ambulated to dresser with RW to obtain all needed items and transferred onto TTB for bathing with overall supervision. Pt remained seated with intermitted supervision and performed lateral leans to wash buttocks. Pt returning to sit at sink for dressing tasks with steady assistance for balance with LB dressing and UB self care with supervision overall. Pt remaining in wheelchair with call bell and all needed items within reach. Quick release belt donned upon exiting the room.   Therapy Documentation Precautions:  Precautions Precautions: Fall Precaution Comments: fearful of falling Restrictions Weight Bearing Restrictions: Yes(ristricted right arm) General:   Vital Signs:  Pain: Pain Assessment Pain Scale: 0-10 Pain Score: 0-No pain  See Function Navigator for Current Functional Status.   Therapy/Group: Individual Therapy  [name] 08/20/2017, 12:40 PM

## 2017-08-20 NOTE — Progress Notes (Signed)
Speech Language Pathology Daily Session Note  Patient Details  Name: [name] MRN: [id number] Date of Birth: [birth year]  Today's Date: 08/20/2017 SLP Individual Time: 1345-1425 SLP Individual Time Calculation (min): 40 min  Short Term Goals: Week 2: SLP Short Term Goal 1 (Week 2): Patient will orient to place, time and situation with Mod A multimodal cues.  SLP Short Term Goal 2 (Week 2): Patient will recall daily information with use of external aids with Mod A verbal cues.  SLP Short Term Goal 3 (Week 2): Patient will demonstrate functional problem solving with basic and familiar tasks with Mod A verbal cues.  SLP Short Term Goal 4 (Week 2): Patient will demonstrate sustained attention to functional tasks for 10 minutes with Mod A verbal cues for redirection.  SLP Short Term Goal 5 (Week 2): Patient will verbally respond to questions in 100% of opportunities with Min A verbal cues.  SLP Short Term Goal 6 (Week 2): Patient will consume current diet with minimal overt s/s of aspiration with Mod I for use of swallowing strategies.   Skilled Therapeutic Interventions: Skilled treatment session focused on cognitive goals. SLP facilitated session by providing Min A verbal cues for recall of her current medications and their functions. SLP also facilitated session by initially providing Max A verbal cues which faded to Min A by end of task for problem solving while organizing a BID pill box. Patient left upright in recliner with alarm on, quick release belt in place and all needs within reach. Continue with current plan of care.      Function:  Cognition Comprehension Comprehension assist level: Understands basic 75 - 89% of the time/ requires cueing 10 - 24% of the time  Expression   Expression assist level: Expresses basic 75 - 89% of the time/requires cueing 10 - 24% of the time. Needs helper to occlude trach/needs to repeat words.  Social Interaction Social Interaction assist level:  Interacts appropriately 90% of the time - Needs monitoring or encouragement for participation or interaction.  Problem Solving Problem solving assist level: Solves basic 75 - 89% of the time/requires cueing 10 - 24% of the time  Memory Memory assist level: Recognizes or recalls 50 - 74% of the time/requires cueing 25 - 49% of the time    Pain No/Denies Pain   Therapy/Group: Individual Therapy  Shirell Struthers 08/20/2017, 2:31 PM

## 2017-08-20 NOTE — Progress Notes (Signed)
Physical Therapy Note  Patient Details  Name: [name] MRN: [id number] Date of Birth: [birth year] Today's Date: 08/20/2017    Time: 1100-1130 30 minutes  1:1 No c/o pain.  Pt performs gait throughout unit with supervision with RW.  Gait training without AD with close supervision x 150'.  PT encouraged pt to perform gait back to room without AD, pt states she feels more comfortable with RW.  Pt left in recliner with alarm set, needs at hand.   Vonzella Althaus 08/20/2017, 12:13 PM

## 2017-08-21 ENCOUNTER — Inpatient Hospital Stay (HOSPITAL_COMMUNITY): Payer: Medicare Other

## 2017-08-21 ENCOUNTER — Inpatient Hospital Stay (HOSPITAL_COMMUNITY): Payer: Medicare Other | Admitting: Physical Therapy

## 2017-08-21 ENCOUNTER — Inpatient Hospital Stay (HOSPITAL_COMMUNITY): Payer: Medicare Other | Admitting: Speech Pathology

## 2017-08-21 ENCOUNTER — Ambulatory Visit (HOSPITAL_COMMUNITY): Payer: Medicare Other

## 2017-08-21 LAB — GLUCOSE, CAPILLARY
GLUCOSE-CAPILLARY: 113 mg/dL — AB (ref 65–99)
Glucose-Capillary: 98 mg/dL (ref 65–99)
Glucose-Capillary: 118 mg/dL — ABNORMAL HIGH (ref 65–99)
Glucose-Capillary: 107 mg/dL — ABNORMAL HIGH (ref 65–99)

## 2017-08-21 LAB — CBC
HEMATOCRIT: 25.7 % — AB (ref 36.0–46.0)
HEMOGLOBIN: 7.9 g/dL — AB (ref 12.0–15.0)
MCH: 25.6 pg — ABNORMAL LOW (ref 26.0–34.0)
MCHC: 30.7 g/dL (ref 30.0–36.0)
MCV: 83.4 fL (ref 78.0–100.0)
Platelets: 306 10*3/uL (ref 150–400)
RBC: 3.08 MIL/uL — ABNORMAL LOW (ref 3.87–5.11)
RDW: 18.9 % — AB (ref 11.5–15.5)
WBC: 5.7 10*3/uL (ref 4.0–10.5)

## 2017-08-21 NOTE — Discharge Summary (Signed)
NAME: [name], KOZAKIEWICZ MEDICAL RECORD [id number] ACCOUNT [id number] DATE OF BIRTH:07-19-1945 FACILITY: [facility] LOCATION: [facility] PHYSICIAN:ZACHARY SWARTZ, MD  DISCHARGE SUMMARY  DATE OF DISCHARGE:  08/22/2017  ADMISSION DATE:  08/06/2017  DISCHARGE DATE:  08/22/2017  DISCHARGE DIAGNOSES: 1.  Encephalopathy secondary to acute respiratory distress syndrome with cardiogenic shock, pulseless electrical activity arrest. 2.  Pain management. 3.  Deep venous thrombosis prophylaxis -- thigh high support hose -- subcutaneous heparin held due to acute blood loss anemia. 4.  Mood. 5.  Dysphagia. 6.  Chronic systolic congestive heart failure. 7.  History of coronary artery disease. 8.  Chronic kidney disease stage II. 9.  Hypertension. 10.  Diabetes mellitus. 11.  History of breast cancer. 12.  Acute blood loss anemia. 13.  Hypokalemia, resolved.  HOSPITAL COURSE:  This is a 72 year old right-handed female with history of CAD maintained on aspirin and Plavix, breast cancer, diabetes mellitus, chronic systolic congestive heart failure, CKD stage II.  Lives with her daughter, independent prior to  admission.  Presented 07/23/2016 with progressive cough, chest pain and hypoxia.  She was found to be septic with large left lung infiltration started on IV antibiotics for community-acquired pneumonia.  She had a brief PEA arrest post-bronchoscopy  revealing alveolar hemorrhage.  Hospital course significant for progressive shortness of breath requiring intubation, cardiogenic shock requiring pressors, ARDS with acute blood loss anemia, progressive leukocytosis and fever.  Infectious disease  consulted for input.  Recommended 8-day course of Merrem to treat aspiration event.  She was extubated 07/31/2017.  Bouts of delirium and agitation.  They continued to improve.  Subcutaneous heparin for DVT prophylaxis.  Dysphagia #2 thin liquid diet.   The patient was admitted for comprehensive rehabilitation  program.  PAST MEDICAL HISTORY:  See discharge diagnoses.  SOCIAL HISTORY:  Lives with daughter.  Reported to be independent prior to admission.  FUNCTIONAL STATUS:  Upon admission to rehab services, moderate assist of 5 feet rolling walker, minimal assist sit to stand, max total assist with activities of daily living.  PHYSICAL EXAMINATION: VITAL SIGNS:  Blood pressure 155/67, pulse 95, temperature 98, respirations 16. GENERAL:  Alert female.  Mood flat.  Follow commands.  Provides her name and age.  She was easily distractable.  EOMs intact. NECK:  Supple, nontender.  No JVD. CARDIOVASCULAR:  Rate controlled. ABDOMEN:  Soft, nontender.  Good bowel sounds. LUNGS:  Clear to auscultation without wheeze.  REHABILITATION HOSPITAL COURSE:  The patient was admitted to inpatient rehabilitation services.  Therapies initiated on a 3-hour daily basis, consisting of physical therapy, occupational therapy, speech therapy and rehabilitation nursing.  The following  issues were addressed during patient's rehabilitation stay:  Pertaining to the patient's encephalopathy related to ARDS cardiogenic shock.  She continued to participate with her therapies.  Initially on subcutaneous heparin for DVT prophylaxis held due  to acute blood loss anemia with latest hemoglobin of 7.9.  Iron studies indicating chronic disease.  She continued on Protonix.  No clinical signs of bleeding.  Mood continued to improve.  She continued on Seroquel at bedtime.  Initially on Atarax  discontinued as a possible contributor to confusion.  She remained afebrile.  She completed an 8-day course of Merrem for community-acquired pneumonia.  Her diet was advanced to a regular consistency.  She exhibited no other signs of fluid overload.   Blood pressure is controlled on Coreg as well as hydralazine and Cozaar.  She would follow up with her primary MD.  CKD stage II.  Latest creatinine 0.99-1.08.  Blood sugars controlled.  She continued on  Lantus insulin alone with blood sugars well controlled.  She did have a history of breast  cancer, maintained on Arimidex.  The patient received weekly collaborative interdisciplinary team conferences to discuss estimated length of stay, family teaching, any barriers to discharge.  She ambulates throughout the unit with supervision of a  rolling walker.  She can ambulate without assistive device and close supervision 150 feet.  Activities of daily living and home making, she could eat her meals full supervision.  Ambulates to her dresser in her room with a rolling walker to obtain all  needed items and transferred to tub bench for bathing and overall supervision.  Again, it was stressed to the family the need for supervision for safety with excellent overall progress and discharge to home.  DISCHARGE MEDICATIONS:  Included Norvasc 10 mg p.o. daily, Arimidex 1 mg p.o. daily, aspirin 81 mg p.o. daily, Coreg 25 mg p.o. b.i.d., Plavix 75 mg p.o. daily, Pepcid 20 mg p.o. daily, ferrous sulfate 325 mg p.o. daily, hydralazine 10 mg p.o. q.8h., Lasix 40 mg daily, Lantus insulin 15 units daily, Cozaar 100 mg p.o. daily, melatonin 1.5 mg p.o. at bedtime, Protonix 40 mg p.o. daily, potassium chloride 20 mg p.o. daily, Seroquel 75 mg p.o. at bedtime, Tylenol as needed.    Her diet was a diabetic diet.  She would follow up with Dr. Alger Simons at the outpatient rehab service office as advised; Dr. Irish Lack, call for appointment; Dr. Larey Dresser, medical management.  TN/NUANCE D:08/21/2017 T:08/21/2017 JOB:[id number]

## 2017-08-21 NOTE — Discharge Summary (Signed)
Discharge summary job [phone]

## 2017-08-21 NOTE — Progress Notes (Signed)
Occupational Therapy Session Note  Patient Details  Name: [name] MRN: [id number] Date of Birth: Aug 20, 1945  Today's Date: 08/21/2017 OT Individual Time: 1030-1110 OT Individual Time Calculation (min): 40 min    Short Term Goals: Week 2:  OT Short Term Goal 1 (Week 2): STGs=LTGS secondary to upcoming discharge date  Skilled Therapeutic Interventions/Progress Updates:    Pt resting in recliner upon arrival and agreeable to therapy.  Briefly pending discharge tomorrow.  Pt stated that she is excited to be going home.  Pt stood from recliner before RW placed in front.  Discussed safety awareness and recommendations.  Pt verbalized understanding.  OT intervention with focus on functional amb with RW for simple home mgmt tasks in ADL: apartment.  Pt completed all tasks at supervision level but required min verbal cues for safety awareness.  Pt returned to room and remained in recliner with QRB in place and all needs within reach.   Therapy Documentation Precautions:  Precautions Precautions: Fall Precaution Comments: fearful of falling Restrictions Weight Bearing Restrictions: Yes   Vital Signs: Therapy Vitals Pulse Rate: 76 Resp: 20 BP: (!) 127/53 Patient Position (if appropriate): Sitting Pain:  Pt denies pain  See Function Navigator for Current Functional Status.   Therapy/Group: Individual Therapy  Leroy Libman 08/21/2017, 2:53 PM

## 2017-08-21 NOTE — Progress Notes (Signed)
Physical Therapy Note  Patient Details  Name: [name] MRN: [id number] Date of Birth: Oct 07, 1945 Today's Date: 08/21/2017  1350-1435. 45 min individual tx Pain: none per pt  tx focused on family ed with 2 dtrs and sister.  Discussed use of Rollator type of Rw, which family plans to purchase.  Demonstrated folding of Rollator to put in,out of car, and use of seat.  Dtr [name] participated in gait on level tile with pt; dtr [name] participated in simulated car transfer, gait on level tile and negotiating step/curb to enter house.  Pt's sister participated in gait over level carpet and basic transfer on/off recliner.  Pt needed reminders to lock/unlock brakes of Rollator 100% of sit>< stands.   Bed mobility on flat mat without rails with modified independence.    Family ed completed for above.    Pt passed to next therapist, Alger Memos, for further family ed.   See function navigator for current status.  [name] 08/21/2017, 2:48 PM

## 2017-08-21 NOTE — Progress Notes (Signed)
Occupational Therapy Discharge Summary  Patient Details  Name: [name] MRN: [id number] Date of Birth: 03-19-1946  Today's Date: 08/21/2017 OT Individual Time: 8144-8185 Session 2: 1430-1500 OT Individual Time Calculation (min): 40 min Session 2: 30 min    Patient has met 8 of 8 long term goals due to improved activity tolerance, improved balance, postural control, ability to compensate for deficits, improved attention, improved awareness and improved coordination.  Patient to discharge at overall Supervision level.  Patient's care partner is independent to provide the necessary physical and cognitive assistance at discharge.    Reasons goals not met: N/A  Recommendation:  Patient does not require any further OT services upon d/c.   Equipment: No equipment provided, Family is independently acquiring a rollator, a BSC, and a TTB  Reasons for discharge: treatment goals met and discharge from hospital  Patient/family agrees with progress made and goals achieved: Yes  OT Discharge  Session 1: Pt received supine in bed agreeable to therapy. Session focused on b/d tasks, safety awareness, and ADL transfers. (S) provided during functional mobility with RW, completing item retrieval for shower. Pt completed toilet transfer and walk in shower transfer with TTB with (S), with vc provided for UE placement. Pt required vc throughout session for RW management. Pt completed UB/LB bathing with set up from seated level. Pt ambulated to sink with HHA and completed oral hygiene from standing with improved dynamic balance and activity tolerance observed. Pt sat EOB and donned UB/LB clothes with (S) during standing level tasks. Pt was left in w/c with all needs met.   Session 2: Pt and multiple family members in room awaiting OT for family ed session. Extensive discussion with family and pt re AE/AD use in the home. This OT made several recommendations re purchase of TTB, including showing family models  and where to purchase. Pt was transported down to ADL apt and completed shower transfer with TTB, with pt requiring (S). Edu provided throughout re positioning, body mechanics, and safety awareness to family. Instruction provided re functional mobility in the kitchen and RW management to maximize safety. Pt was returned to room and final Q/A provided with pt/family.   Precautions/Restrictions  Precautions Precautions: Fall Pain Pain Assessment Pain Scale: 0-10 Pain Score: 0-No pain ADL ADL ADL Comments: See functional navigator Vision Baseline Vision/History: No visual deficits Patient Visual Report: No change from baseline Vision Assessment?: No apparent visual deficits Perception  Perception: Within Functional Limits Praxis Praxis: Intact Cognition Arousal/Alertness: Awake/alert Orientation Level: Oriented X4 Attention: Selective Focused Attention: Appears intact Selective Attention: Appears intact Memory: Impaired Safety/Judgment: Impaired Comments: Safety awareness has significantly improved Sensation Sensation Light Touch: Appears Intact Proprioception: Appears Intact Additional Comments: L UE with increased edema secondary to blood transfusion Iv Coordination Gross Motor Movements are Fluid and Coordinated: Yes Fine Motor Movements are Fluid and Coordinated: Yes Coordination and Movement Description: limited by weakness  Motor  Motor Motor: Within Functional Limits Mobility  Transfers Transfers: Sit to Stand;Stand to Sit Sit to Stand: Supervision/Verbal cueing Stand to Sit: Supervision/Verbal cueing  Trunk/Postural Assessment  Cervical Assessment Cervical Assessment: Within Functional Limits Thoracic Assessment Thoracic Assessment: Within Functional Limits Lumbar Assessment Lumbar Assessment: Within Functional Limits Postural Control Postural Control: Deficits on evaluation Righting Reactions: Delayed Protective Responses: Delayed   Balance Balance Balance Assessed: Yes Static Sitting Balance Static Sitting - Balance Support: Bilateral upper extremity supported;Feet supported Static Sitting - Level of Assistance: 6: Modified independent (Device/Increase time) Dynamic Sitting Balance Dynamic Sitting - Balance Support:  Left upper extremity supported;Feet supported Dynamic Sitting - Level of Assistance: 6: Modified independent (Device/Increase time) Dynamic Sitting - Balance Activities: Reaching for objects Extremity/Trunk Assessment RUE Assessment RUE Assessment: Exceptions to [facility] General Strength Comments: 4/5 overall LUE Assessment LUE Assessment: Exceptions to [facility] General Strength Comments: 4/5 overall   See Function Navigator for Current Functional Status.  Curtis Sites 08/21/2017, 10:14 AM

## 2017-08-21 NOTE — Progress Notes (Signed)
Physical Therapy Session Note  Patient Details  Name: [name] MRN: [id number] Date of Birth: 1945/10/11  Today's Date: 08/21/2017 PT Individual Time: 1130-1200 PT Individual Time Calculation (min): 30 min   Short Term Goals: Week 2:  PT Short Term Goal 1 (Week 2): = LTG  Skilled Therapeutic Interventions/Progress Updates:    Pt received seated in recliner in room, agreeable to PT. No complaints of pain. Sit to stand with S to RW. Ambulation x 300 ft with RW and S. Pt is able to navigate from her room to/from therapy gym with no directional cueing. Trial ambulation with 4WW as this is what family has purchased for pt use at home. Ambulation x 150 ft with 3AF and Supervision. Pt exhibits good balance and safety with 4WW just requires cueing for safe brake management. Car transfer with S, v/c for safe transfer technique and to sit down on seat prior to bringing legs into the car. Pt left seated in recliner in room with needs in reach, quick-release belt in place.  Therapy Documentation Precautions:  Precautions Precautions: Fall Precaution Comments: fearful of falling Restrictions Weight Bearing Restrictions: Yes  See Function Navigator for Current Functional Status.   Therapy/Group: Individual Therapy  Excell Seltzer, PT, DPT  08/21/2017, 12:14 PM

## 2017-08-21 NOTE — Progress Notes (Signed)
[facility] PHYSICAL MEDICINE & REHABILITATION     PROGRESS NOTE    Subjective/Complaints: No complaints. Anxious to get home.   ROS: Patient denies fever, rash, sore throat, blurred vision, nausea, vomiting, diarrhea, cough, shortness of breath or chest pain, joint or back pain, headache, or mood change. .   Objective:  No results found. Recent Labs    08/19/17 0453 08/21/17 0517  WBC 5.6 5.7  HGB 7.4* 7.9*  HCT 24.0* 25.7*  PLT 331 306   Recent Labs    08/19/17 0453  NA 140  K 3.4*  CL 114*  GLUCOSE 80  BUN 23*  CREATININE 1.08*  CALCIUM 8.8*   CBG (last 3)  Recent Labs    08/20/17 1646 08/20/17 2114 08/21/17 0644  GLUCAP 135* 125* 98    Wt Readings from Last 3 Encounters:  08/20/17 79.4 kg (175 lb 0.7 oz)  08/06/17 81.7 kg (180 lb 1.9 oz)  06/19/17 87.1 kg (192 lb 1.6 oz)     Intake/Output Summary (Last 24 hours) at 08/21/2017 0846 Last data filed at 08/21/2017 0524 Gross per 24 hour  Intake 655 ml  Output -  Net 655 ml    Vital Signs: Blood pressure (!) 153/67, pulse 84, temperature 98.9 F (37.2 C), temperature source Oral, resp. rate 20, height 5' 4"  (1.626 m), weight 79.4 kg (175 lb 0.7 oz), SpO2 100 %. Physical Exam:  Constitutional: No distress . Vital signs reviewed. HEENT: EOMI, oral membranes moist Neck: supple Cardiovascular: RRR without murmur. No JVD    Respiratory: CTA Bilaterally without wheezes or rales. Normal effort    GI: BS +, non-tender, non-distended  Musc: No edema or tenderness in extremities. Neurological:  Alert   Motor: RUE: 4/5 proximal to distal  LUE: shoulder abduction, elbow flex/ext 4/5, hand grip 4/5  --shies away from using left hand B/l LE: HF 4/5, KE 4/5, ADF 4/5 (stable exam)  Psychiatric: cooperative  Assessment/Plan: 1. Functional deficits secondary to debility/encephalopathy which require 3+ hours per day of interdisciplinary therapy in a comprehensive inpatient rehab setting. Physiatrist is providing  close team supervision and 24 hour management of active medical problems listed below. Physiatrist and rehab team continue to assess barriers to discharge/monitor patient progress toward functional and medical goals.  Function:  Bathing Bathing position Bathing activity did not occur: Refused Position: Shower  Bathing parts Body parts bathed by patient: Chest, Right arm, Left arm, Abdomen, Front perineal area, Left upper leg, Right upper leg, Buttocks, Right lower leg, Left lower leg Body parts bathed by helper: Right lower leg, Left lower leg, Back  Bathing assist Assist Level: Supervision or verbal cues      Upper Body Dressing/Undressing Upper body dressing   What is the patient wearing?: Bra, Pull over shirt/dress Bra - Perfomed by patient: Thread/unthread right bra strap, Thread/unthread left bra strap, Hook/unhook bra (pull down sports bra) Bra - Perfomed by helper: Hook/unhook bra (pull down sports bra) Pull over shirt/dress - Perfomed by patient: Thread/unthread right sleeve, Thread/unthread left sleeve, Put head through opening, Pull shirt over trunk          Upper body assist Assist Level: Supervision or verbal cues, Set up   Set up : To obtain clothing/put away  Lower Body Dressing/Undressing Lower body dressing   What is the patient wearing?: Non-skid slipper socks, Underwear, Pants Underwear - Performed by patient: Thread/unthread left underwear leg, Pull underwear up/down, Thread/unthread right underwear leg Underwear - Performed by helper: Thread/unthread right underwear leg Pants-  Performed by patient: Pull pants up/down, Thread/unthread left pants leg, Thread/unthread right pants leg Pants- Performed by helper: Thread/unthread right pants leg Non-skid slipper socks- Performed by patient: Don/doff right sock, Don/doff left sock Non-skid slipper socks- Performed by helper: Don/doff right sock, Don/doff left sock Socks - Performed by patient: Don/doff right sock,  Don/doff left sock   Shoes - Performed by patient: Don/doff right shoe, Don/doff left shoe, Fasten right, Fasten left         TED Hose - Performed by helper: Don/doff right TED hose, Don/doff left TED hose  Lower body assist Assist for lower body dressing: Touching or steadying assistance (Pt > 75%)      Toileting Toileting Toileting activity did not occur: No continent bowel/bladder event Toileting steps completed by patient: Adjust clothing prior to toileting, Performs perineal hygiene, Adjust clothing after toileting Toileting steps completed by helper: Adjust clothing prior to toileting, Performs perineal hygiene, Adjust clothing after toileting Toileting Assistive Devices: Grab bar or rail  Toileting assist Assist level: Supervision or verbal cues   Transfers Chair/bed transfer   Chair/bed transfer method: Stand pivot Chair/bed transfer assist level: Supervision or verbal cues Chair/bed transfer assistive device: Armrests     Locomotion Ambulation Ambulation activity did not occur: Safety/medical concerns   Max distance: 150' Assist level: Supervision or verbal cues   Wheelchair Wheelchair activity did not occur: Safety/medical concerns Type: Manual   Assist Level: Dependent (Pt equals 0%)  Cognition Comprehension Comprehension assist level: Understands basic 75 - 89% of the time/ requires cueing 10 - 24% of the time  Expression Expression assist level: Expresses basic 75 - 89% of the time/requires cueing 10 - 24% of the time. Needs helper to occlude trach/needs to repeat words.  Social Interaction Social Interaction assist level: Interacts appropriately 90% of the time - Needs monitoring or encouragement for participation or interaction.  Problem Solving Problem solving assist level: Solves basic 75 - 89% of the time/requires cueing 10 - 24% of the time  Memory Memory assist level: Recognizes or recalls 50 - 74% of the time/requires cueing 25 - 49% of the time    Medical Problem List and Plan:  1. Debility and encephalopathy secondary to ARDS/cardiogenic shock/PEA arrest /CAP/multi-medical-extubated 07/31/2017   Cont CIR.   -family ed today. Dc 6/6 2. DVT Prophylaxis/Anticoagulation: hold given ABLA  3. Pain Management: Tylenol as needed  4. Mood/delirium/insomnia: Seroquel 41m QHS at 8:30pm   Atarax stopped on 5/27 due to possible contribution to confusion  stop sleep-wake  chart     5. Neuropsych: This patient is not capable of making decisions on her own behalf.  6. Skin/Wound Care: keflex completed for left hand cellulitis 7. Fluids/Electrolytes/Nutrition: encourage PO  8. CAP/fevers. 8-day course of Merrem completed, afebrile at present  9. Dysphagia. Follow-up speech therapy   Encourage PO.     advanced to regular consistency diet on 5/29 10. Chronic systolic congestive heart failure.   ? Reliability---no signs of fluid overload on exam---baseline appears to be around 80kg Filed Weights   08/18/17 0602 08/19/17 0501 08/20/17 0444  Weight: 76.6 kg (168 lb 14 oz) 79.4 kg (175 lb 0.7 oz) 79.4 kg (175 lb 0.7 oz)  11. History of CAD. Continue aspirin and Plavix  12. CKD stage II.   Cr 0.99 5/28---1.08 6/3   y.    Cont to monitor 13. Hypertension. Norvasc 10 mg daily, Cozaar 100 mg daily.   Coreg 12.5 mg twice daily, increased to 25 on 5/24  Hydralazine  10 3 times a day started on 5/27   Overall BP improved and reasonable at present 6/3 14. Diabetes mellitus with peripheral neuropathy. Lantus insulin 15 units nightly.   Relatively controlled on 6/3  15. History of breast cancer. Continue arimidex  16.  Urinary frequency/odor:   Keflex completed 18. Leukocytosis: Resolved  WBCs 5.6 on 6/3 17. Acute blood loss anemia  Hemoglobin up to 7.9 today  -iron studies indicated chronic disease component  -stool sample never checked  -stopped sq hep  -continue protonix  -no clinical signs of bleeding.   -recheck once more  tomorrow  -remove central line today    18. Sleep disturbance  Melatonin added on 10/27 19. Hypokalemia  Potassium 3.4 on 5/28 and again on 6/3  Continue supplement  LOS (Days) [place] EVALUATION WAS PERFORMED  Meredith Staggers, MD 08/21/2017 8:46 AM

## 2017-08-21 NOTE — Progress Notes (Signed)
Speech Language Pathology Daily Session Note  Patient Details  Name: [name] MRN: [id number] Date of Birth: 1945-08-30  Today's Date: 08/21/2017 SLP Individual Time: 1300-1345 SLP Individual Time Calculation (min): 45 min  Short Term Goals: Week 2: SLP Short Term Goal 1 (Week 2): Patient will orient to place, time and situation with Mod A multimodal cues.  SLP Short Term Goal 2 (Week 2): Patient will recall daily information with use of external aids with Mod A verbal cues.  SLP Short Term Goal 3 (Week 2): Patient will demonstrate functional problem solving with basic and familiar tasks with Mod A verbal cues.  SLP Short Term Goal 4 (Week 2): Patient will demonstrate sustained attention to functional tasks for 10 minutes with Mod A verbal cues for redirection.  SLP Short Term Goal 5 (Week 2): Patient will verbally respond to questions in 100% of opportunities with Min A verbal cues.  SLP Short Term Goal 6 (Week 2): Patient will consume current diet with minimal overt s/s of aspiration with Mod I for use of swallowing strategies.   Skilled Therapeutic Interventions: Skilled treatment session focused on cognitive goals and completion of family education. Patient was re-administered the MoCA and scored 19/30 points with a score of 26 or above considered normal. However, patient's score increased by 8 points since initial assessment on 08/15/17. Patient continues to demonstrate deficits in executive functioning, recall and attention. Patient's family present and educated on strategies to utilize at home to maximize recall, problem solving, safety and overall functional independence. All verbalized understanding and agreement and handouts were also given to reinforce information. Patient left upright in recliner with family present. Continue with current plan of care.      Function:   Cognition Comprehension Comprehension assist level: Follows basic conversation/direction with extra  time/assistive device  Expression   Expression assist level: Expresses basic 75 - 89% of the time/requires cueing 10 - 24% of the time. Needs helper to occlude trach/needs to repeat words.  Social Interaction Social Interaction assist level: Interacts appropriately 90% of the time - Needs monitoring or encouragement for participation or interaction.  Problem Solving Problem solving assist level: Solves basic 75 - 89% of the time/requires cueing 10 - 24% of the time  Memory Memory assist level: Recognizes or recalls 50 - 74% of the time/requires cueing 25 - 49% of the time    Pain No/Denies Pain   Therapy/Group: Individual Therapy  Freman Lapage 08/21/2017, 3:51 PM

## 2017-08-21 NOTE — Plan of Care (Signed)
  Problem: Consults Goal: RH GENERAL PATIENT EDUCATION Description See Patient Education module for education specifics. Outcome: Progressing Goal: Skin Care Protocol Initiated - if Braden Score 18 or less Description If consults are not indicated, leave blank or document N/A Outcome: Progressing Goal: Nutrition Consult-if indicated Outcome: Progressing Goal: Diabetes Guidelines if Diabetic/Glucose > 140 Description If diabetic or lab glucose is > 140 mg/dl - Initiate Diabetes/Hyperglycemia Guidelines & Document Interventions  Outcome: Progressing   Problem: RH BOWEL ELIMINATION Goal: RH STG MANAGE BOWEL WITH ASSISTANCE Description STG Manage Bowel with Min Assistance.  Outcome: Progressing Flowsheets (Taken 08/21/2017 1541) STG: Pt will manage bowels with assistance: 6-Modified independent Goal: RH STG MANAGE BOWEL W/MEDICATION W/ASSISTANCE Description STG Manage Bowel with Medication with [place].  Outcome: Progressing Flowsheets (Taken 08/21/2017 1541) STG: Pt will manage bowels with medication with assistance: 6-Modified independent   Problem: RH BLADDER ELIMINATION Goal: RH STG MANAGE BLADDER WITH ASSISTANCE Description STG Manage Bladder With Min Assistance  Outcome: Progressing Flowsheets (Taken 08/21/2017 1541) STG: Pt will manage bladder with assistance: 6-Modified independent   Problem: RH SKIN INTEGRITY Goal: RH STG SKIN FREE OF INFECTION/BREAKDOWN Description Skin care with min assistance   Outcome: Progressing Goal: RH STG MAINTAIN SKIN INTEGRITY WITH ASSISTANCE Description STG Maintain Skin Integrity With [place].  Outcome: Progressing Flowsheets (Taken 08/21/2017 1541) STG: Maintain skin integrity with assistance: 6-Modified independent   Problem: RH SAFETY Goal: RH STG ADHERE TO SAFETY PRECAUTIONS W/ASSISTANCE/DEVICE Description STG Adhere to Safety Precautions With Min Assistance/Device.  Outcome: Progressing Flowsheets (Taken 08/21/2017  1541) STG:Pt will adhere to safety precautions with assistance/device: 5-Supervision/cueing Goal: RH STG DECREASED RISK OF FALL WITH ASSISTANCE Description STG Decreased Risk of Fall With [employer].  Outcome: Progressing Flowsheets (Taken 08/21/2017 1541) [id number] risk of fall  with assistance/device: 5-Supervison/set up   Problem: RH PAIN MANAGEMENT Goal: RH STG PAIN MANAGED AT OR BELOW PT'S PAIN GOAL Description Min assistance  Outcome: Progressing   Problem: RH KNOWLEDGE DEFICIT GENERAL Goal: RH STG INCREASE KNOWLEDGE OF SELF CARE AFTER HOSPITALIZATION Description Able to verbalize knowledge of disease processes and preventative measures of complications of hypertension, CAD, DM  Outcome: Progressing

## 2017-08-22 LAB — GLUCOSE, CAPILLARY: GLUCOSE-CAPILLARY: 84 mg/dL (ref 65–99)

## 2017-08-22 MED ORDER — FUROSEMIDE 40 MG PO TABS: 40.0000 mg | ORAL_TABLET | Freq: Every day | ORAL | 3 refills | Status: DC

## 2017-08-22 MED ORDER — LOSARTAN POTASSIUM 100 MG PO TABS: 100.0000 mg | ORAL_TABLET | Freq: Every day | ORAL | 1 refills | Status: DC

## 2017-08-22 MED ORDER — CLOPIDOGREL BISULFATE 75 MG PO TABS: 75.0000 mg | ORAL_TABLET | Freq: Every day | ORAL | 3 refills | Status: DC

## 2017-08-22 MED ORDER — FAMOTIDINE 20 MG PO TABS: 20.0000 mg | ORAL_TABLET | Freq: Every day | ORAL | 0 refills | Status: DC

## 2017-08-22 MED ORDER — FERROUS FUMARATE 325 (106 FE) MG PO TABS: 1.0000 | ORAL_TABLET | Freq: Every day | ORAL | 0 refills | Status: DC

## 2017-08-22 MED ORDER — HYDRALAZINE HCL 10 MG PO TABS: 10.0000 mg | ORAL_TABLET | Freq: Three times a day (TID) | ORAL | 1 refills | Status: DC

## 2017-08-22 MED ORDER — CARVEDILOL 25 MG PO TABS: 25.0000 mg | ORAL_TABLET | Freq: Two times a day (BID) | ORAL | 1 refills | Status: DC

## 2017-08-22 MED ORDER — PANTOPRAZOLE SODIUM 40 MG PO TBEC: 40.0000 mg | DELAYED_RELEASE_TABLET | Freq: Every day | ORAL | 1 refills | Status: DC

## 2017-08-22 MED ORDER — MELATONIN 3 MG PO TABS: 1.5000 mg | ORAL_TABLET | Freq: Every day | ORAL | 0 refills | Status: DC

## 2017-08-22 MED ORDER — QUETIAPINE FUMARATE 25 MG PO TABS: 75.0000 mg | ORAL_TABLET | Freq: Every day | ORAL | 1 refills | Status: DC

## 2017-08-22 MED ORDER — AMLODIPINE BESYLATE 10 MG PO TABS: 10.0000 mg | ORAL_TABLET | Freq: Every day | ORAL | 0 refills | Status: DC

## 2017-08-22 MED ORDER — POTASSIUM CHLORIDE CRYS ER 20 MEQ PO TBCR: 20.0000 meq | EXTENDED_RELEASE_TABLET | Freq: Every day | ORAL | 1 refills | Status: DC

## 2017-08-22 MED ORDER — ATORVASTATIN CALCIUM 40 MG PO TABS: 40.0000 mg | ORAL_TABLET | Freq: Every day | ORAL | 3 refills | Status: DC

## 2017-08-22 MED ORDER — INSULIN GLARGINE 100 UNITS/ML SOLOSTAR PEN: 15.0000 [IU] | PEN_INJECTOR | Freq: Every day | SUBCUTANEOUS | 11 refills | Status: DC

## 2017-08-22 NOTE — Progress Notes (Signed)
[facility] PHYSICAL MEDICINE & REHABILITATION     PROGRESS NOTE    Subjective/Complaints: No new complaints. Excited to be going home!  ROS: Patient denies fever, rash, sore throat, blurred vision, nausea, vomiting, diarrhea, cough, shortness of breath or chest pain, joint or back pain, headache, or mood change. .   Objective:  No results found. Recent Labs    08/21/17 0517  WBC 5.7  HGB 7.9*  HCT 25.7*  PLT 306   No results for input(s): NA, K, CL, GLUCOSE, BUN, CREATININE, CALCIUM in the last 72 hours.  Invalid input(s): CO CBG (last 3)  Recent Labs    08/21/17 1645 08/21/17 2135 08/22/17 0649  GLUCAP 107* 113* 84    Wt Readings from Last 3 Encounters:  08/22/17 79.4 kg (175 lb)  08/06/17 81.7 kg (180 lb 1.9 oz)  06/19/17 87.1 kg (192 lb 1.6 oz)     Intake/Output Summary (Last 24 hours) at 08/22/2017 0841 Last data filed at 08/21/2017 1700 Gross per 24 hour  Intake 240 ml  Output -  Net 240 ml    Vital Signs: Blood pressure (!) 124/51, pulse 77, temperature 98.2 F (36.8 C), resp. rate 18, height 5' 4"  (1.626 m), weight 79.4 kg (175 lb), SpO2 98 %. Physical Exam:  Constitutional: No distress . Vital signs reviewed. HEENT: EOMI, oral membranes moist Neck: supple Cardiovascular: RRR without murmur. No JVD    Respiratory: CTA Bilaterally without wheezes or rales. Normal effort    GI: BS +, non-tender, non-distended  Musc: No edema or tenderness in extremities. Neurological:  Alert   Motor: RUE: 4/5 proximal to distal  LUE: shoulder abduction, elbow flex/ext 4/5, hand grip 4/5  --shies away from using left hand B/l LE: HF 4/5, KE 4/5, ADF 4/5 (stable exam)  Psychiatric: cooperative  Assessment/Plan: 1. Functional deficits secondary to debility/encephalopathy which require 3+ hours per day of interdisciplinary therapy in a comprehensive inpatient rehab setting. Physiatrist is providing close team supervision and 24 hour management of active medical  problems listed below. Physiatrist and rehab team continue to assess barriers to discharge/monitor patient progress toward functional and medical goals.  Function:  Bathing Bathing position Bathing activity did not occur: Refused Position: Shower  Bathing parts Body parts bathed by patient: Chest, Right arm, Left arm, Abdomen, Front perineal area, Left upper leg, Right upper leg, Buttocks, Right lower leg, Left lower leg, Back Body parts bathed by helper: Right lower leg, Left lower leg, Back  Bathing assist Assist Level: Set up, Supervision or verbal cues   Set up : To obtain items  Upper Body Dressing/Undressing Upper body dressing   What is the patient wearing?: Bra, Pull over shirt/dress Bra - Perfomed by patient: Thread/unthread right bra strap, Thread/unthread left bra strap, Hook/unhook bra (pull down sports bra) Bra - Perfomed by helper: Hook/unhook bra (pull down sports bra) Pull over shirt/dress - Perfomed by patient: Thread/unthread right sleeve, Thread/unthread left sleeve, Put head through opening, Pull shirt over trunk          Upper body assist Assist Level: Supervision or verbal cues, Set up   Set up : To obtain clothing/put away  Lower Body Dressing/Undressing Lower body dressing   What is the patient wearing?: Non-skid slipper socks, Underwear, Pants Underwear - Performed by patient: Thread/unthread left underwear leg, Pull underwear up/down, Thread/unthread right underwear leg Underwear - Performed by helper: Thread/unthread right underwear leg Pants- Performed by patient: Pull pants up/down, Thread/unthread left pants leg, Thread/unthread right pants leg Pants-  Performed by helper: Thread/unthread right pants leg Non-skid slipper socks- Performed by patient: Don/doff right sock, Don/doff left sock Non-skid slipper socks- Performed by helper: Don/doff right sock, Don/doff left sock Socks - Performed by patient: Don/doff right sock, Don/doff left sock   Shoes -  Performed by patient: Don/doff right shoe, Don/doff left shoe, Fasten right, Fasten left         TED Hose - Performed by helper: Don/doff right TED hose, Don/doff left TED hose  Lower body assist Assist for lower body dressing: Supervision or verbal cues      Toileting Toileting Toileting activity did not occur: No continent bowel/bladder event Toileting steps completed by patient: Adjust clothing prior to toileting, Performs perineal hygiene, Adjust clothing after toileting Toileting steps completed by helper: Adjust clothing prior to toileting, Performs perineal hygiene, Adjust clothing after toileting Toileting Assistive Devices: Grab bar or rail, Toilet aid  Toileting assist Assist level: Supervision or verbal cues   Transfers Chair/bed transfer   Chair/bed transfer method: Ambulatory Chair/bed transfer assist level: Supervision or verbal cues Chair/bed transfer assistive device: Armrests, Medical sales representative Ambulation activity did not occur: Safety/medical concerns   Max distance: 150 Assist level: Supervision or verbal cues   Wheelchair Wheelchair activity did not occur: Safety/medical concerns Type: Manual   Assist Level: Dependent (Pt equals 0%)  Cognition Comprehension Comprehension assist level: Follows basic conversation/direction with extra time/assistive device  Expression Expression assist level: Expresses basic 75 - 89% of the time/requires cueing 10 - 24% of the time. Needs helper to occlude trach/needs to repeat words.  Social Interaction Social Interaction assist level: Interacts appropriately 90% of the time - Needs monitoring or encouragement for participation or interaction.  Problem Solving Problem solving assist level: Solves basic 75 - 89% of the time/requires cueing 10 - 24% of the time  Memory Memory assist level: Recognizes or recalls 50 - 74% of the time/requires cueing 25 - 49% of the time   Medical Problem List and Plan:  1.  Debility and encephalopathy secondary to ARDS/cardiogenic shock/PEA arrest /CAP/multi-medical-extubated 07/31/2017   Cont CIR.   -dc home  -Patient to see Rehab MD/provider in the office for transitional care encounter in 1-2 weeks.  2. DVT Prophylaxis/Anticoagulation: hold given ABLA  3. Pain Management: Tylenol as needed  4. Mood/delirium/insomnia: Seroquel 74m QHS at 8:30pm   Atarax stopped on 5/27 due to possible contribution to confusion  stop sleep-wake  chart     5. Neuropsych: This patient is not capable of making decisions on her own behalf.  6. Skin/Wound Care: keflex completed for left hand cellulitis 7. Fluids/Electrolytes/Nutrition: encourage PO  8. CAP/fevers. 8-day course of Merrem completed, afebrile at present  9. Dysphagia. Follow-up speech therapy   Encourage PO.     advanced to regular consistency diet on 5/29 10. Chronic systolic congestive heart failure.   controlled Filed Weights   08/20/17 0444 08/21/17 0434 08/22/17 0434  Weight: 79.4 kg (175 lb 0.7 oz) 79.5 kg (175 lb 4.3 oz) 79.4 kg (175 lb)  11. History of CAD. Continue aspirin and Plavix  12. CKD stage II.   Cr 0.99 5/28---1.08 6/3   y.    Cont to monitor 13. Hypertension. Norvasc 10 mg daily, Cozaar 100 mg daily.   Coreg 12.5 mg twice daily, increased to 25 on 5/24  Hydralazine 10 3 times a day started on 5/27   Overall BP improved and reasonable at present 6/3 14. Diabetes mellitus with peripheral neuropathy. Lantus  insulin 15 units nightly.   Relatively controlled on 6/3  15. History of breast cancer. Continue arimidex  16.  Urinary frequency/odor:   Keflex completed 18. Leukocytosis: Resolved  WBCs 5.6 on 6/3 17. Acute blood loss anemia  Hemoglobin up to 7.9 6/5  -iron studies indicated chronic disease component  -iron supp       18. Sleep disturbance  Melatonin added on 10/27 19. Hypokalemia  Potassium 3.4 on 5/28 and again on 6/3  Continue supplement  LOS (Days) [place]  EVALUATION WAS PERFORMED  Meredith Staggers, MD 08/22/2017 8:41 AM

## 2017-08-22 NOTE — Progress Notes (Signed)
Speech Language Pathology Discharge Summary  Patient Details  Name: [name] MRN: [id number] Date of Birth: 11-Jul-1945  Patient has met 9 of 9 long term goals.  Patient to discharge at overall Supervision;Min level.   Reasons goals not met: N/A   Clinical Impression/Discharge Summary: Patient has made excellent gains and has met 9 of 9 LTG's this admission. Currently, patient is consuming regular textures with thin liquids without overt s/s of aspiration and overall Mod I for use of swallowing compensatory strategies. Patient also demonstrates a significant improvement in cognitive function but continues to require overall Mod A for recall with use of compensatory strategies and Min A verbal cues for functional problem solving. Patient also demonstrates increased verbal expression 2/2 improved cognitive functioning. Patient and family education is complete and patient will discharge home with 24 hour supervision from family. Patient would benefit from f/u SLP services to maximize her cognitive function and overall functional independence in order to reduce caregiver burden.    Care Partner:  Caregiver Able to Provide Assistance: Yes  Type of Caregiver Assistance: Physical;Cognitive  Recommendation:  Outpatient SLP;24 hour supervision/assistance  Rationale for SLP Follow Up: Reduce caregiver burden;Maximize cognitive function and independence   Equipment: N/A   Reasons for discharge: Discharged from hospital;Treatment goals met   Patient/Family Agrees with Progress Made and Goals Achieved: Yes     Steffi Noviello, [place] 08/22/2017, 6:46 AM

## 2017-08-22 NOTE — Plan of Care (Signed)
  Problem: Consults Goal: RH GENERAL PATIENT EDUCATION Description See Patient Education module for education specifics. Outcome: Progressing Goal: Skin Care Protocol Initiated - if Braden Score 18 or less Description If consults are not indicated, leave blank or document N/A Outcome: Progressing Goal: Nutrition Consult-if indicated Outcome: Progressing Goal: Diabetes Guidelines if Diabetic/Glucose > 140 Description If diabetic or lab glucose is > 140 mg/dl - Initiate Diabetes/Hyperglycemia Guidelines & Document Interventions  Outcome: Progressing   Problem: RH BOWEL ELIMINATION Goal: RH STG MANAGE BOWEL WITH ASSISTANCE Description STG Manage Bowel with Min Assistance.  Outcome: Progressing Goal: RH STG MANAGE BOWEL W/MEDICATION W/ASSISTANCE Description STG Manage Bowel with Medication with [place].  Outcome: Progressing   Problem: RH BLADDER ELIMINATION Goal: RH STG MANAGE BLADDER WITH ASSISTANCE Description STG Manage Bladder With Min Assistance  Outcome: Progressing   Problem: RH SKIN INTEGRITY Goal: RH STG SKIN FREE OF INFECTION/BREAKDOWN Description Skin care with min assistance   Outcome: Progressing Goal: RH STG MAINTAIN SKIN INTEGRITY WITH ASSISTANCE Description STG Maintain Skin Integrity With [place].  Outcome: Progressing   Problem: RH SAFETY Goal: RH STG ADHERE TO SAFETY PRECAUTIONS W/ASSISTANCE/DEVICE Description STG Adhere to Safety Precautions With Min Assistance/Device.  Outcome: Progressing Goal: RH STG DECREASED RISK OF FALL WITH ASSISTANCE Description STG Decreased Risk of Fall With [employer].  Outcome: Progressing   Problem: RH PAIN MANAGEMENT Goal: RH STG PAIN MANAGED AT OR BELOW PT'S PAIN GOAL Description Min assistance  Outcome: Progressing   Problem: RH KNOWLEDGE DEFICIT GENERAL Goal: RH STG INCREASE KNOWLEDGE OF SELF CARE AFTER HOSPITALIZATION Description Able to verbalize knowledge of disease processes and  preventative measures of complications of hypertension, CAD, DM  Outcome: Progressing

## 2017-08-22 NOTE — Progress Notes (Signed)
Social Work  Discharge Note  The overall goal for the admission was met for:   Discharge location: Yes - pt returning to her home with family providing 24/7 supervision  Length of Stay: Yes - 16 days  Discharge activity level: Yes - supervision  Home/community participation: Yes  Services provided included: MD, RD, PT, OT, SLP, RN, TR, Pharmacy, Neuropsych and SW  Financial Services: UHC Medicare and Private Insurance: [place]  Follow-up services arranged: Home Health: Therapist, sports, PT, OT, ST, SW via [place] and Patient/Family request agency HH: Company secretary, DME: NA - family to purchase tub bench  Comments (or additional information): daughter, [name], reports plan to change primary MD, therefore, this SW did not arrange primary MD f/u appt.    Patient/Family verbalized understanding of follow-up arrangements: Yes  Individual responsible for coordination of the follow-up plan: pt/ daughter  Confirmed correct DME delivered: NA  Debbi Strandberg

## 2017-08-22 NOTE — Discharge Instructions (Signed)
Inpatient Rehab Discharge Instructions  [name] Discharge date and time: No discharge date for patient encounter.   Activities/Precautions/ Functional Status: Activity: activity as tolerated Diet: Dysphagia #2 thin liquids Wound Care: none needed Functional status:  ___ No restrictions     ___ Walk up steps independently ___ 24/7 supervision/assistance   ___ Walk up steps with assistance ___ Intermittent supervision/assistance  ___ Bathe/dress independently ___ Walk with walker     _x__ Bathe/dress with assistance ___ Walk Independently    ___ Shower independently ___ Walk with assistance    ___ Shower with assistance ___ No alcohol     ___ Return to work/school ________   COMMUNITY REFERRALS UPON DISCHARGE:    Home Health:   PT     OT     ST    RN     SW                    Agency:  [facility] Phone: [phone]       Special Instructions:    My questions have been answered and I understand these instructions. I will adhere to these goals and the provided educational materials after my discharge from the hospital.  Patient/Caregiver Signature _______________________________ Date __________  Clinician Signature _______________________________________ Date __________  Please bring this form and your medication list with you to all your follow-up doctor's appointments.

## 2017-08-22 NOTE — Patient Care Conference (Signed)
Inpatient RehabilitationTeam Conference and Plan of Care Update Date: 08/20/2017   Time: 2:40 PM    Patient Name: [name]      Medical Record Number: [id number]  Date of Birth: [birth year] Sex: Female         Room/Bed: 4W10C/4W10C-01 Payor Info: Payor: [name] MEDICARE / Plan: UHC MEDICARE / Product Type: *No Product type* /    Admitting Diagnosis: Debility  Admit Date/Time:  08/06/2017  6:37 PM Admission Comments: No comment available   Primary Diagnosis:  <principal problem not specified> Principal Problem: <principal problem not specified>  Patient Active Problem List   Diagnosis Date Noted  . Hypokalemia   . Labile blood glucose   . Sleep disturbance   . Acute blood loss anemia   . Leukocytosis   . Essential hypertension   . Urinary frequency   . Type 2 diabetes mellitus with peripheral neuropathy (HCC)   . Benign essential HTN   . Hypertensive crisis   . Stage 2 chronic kidney disease   . Dysphagia   . ARDS (adult respiratory distress syndrome) ([place]) 08/06/2017  . Debility   . Encephalopathy   . Pressure injury of skin 08/04/2017  . Pneumonia of left lung due to infectious organism   . Acute respiratory failure with hypoxemia ([place])   . Cardiac arrest, cause unspecified ([place])   . Shock circulatory ([place])   . Septic shock ([place])   . HCAP (healthcare-associated pneumonia)   . Diffuse pulmonary alveolar hemorrhage   . Chest pain 07/23/2017  . Hypoxia 07/23/2017  . SOB (shortness of breath) 10/11/2016  . Hyperparathyroidism, secondary renal ([place]) 05/02/2016  . Gout 04/11/2015  . Diabetic neuropathy associated with type 2 diabetes mellitus ([place]) 01/06/2015  . Breast cancer of lower-inner quadrant of right female breast ([place]) 03/18/2013  . Chronic venous insufficiency 01/06/2013  . Health care maintenance 05/08/2011  . Diabetes mellitus type 2 in obese ([place]) 05/03/2010  . Seasonal allergies 05/03/2010  . Labile hypertension   . CAD (coronary artery  disease)   . Abdominal discomfort   . Anemia in chronic kidney disease   . History of stroke without residual deficits   . Hyperlipidemia associated with type 2 diabetes mellitus ([place])   . Chronic pain   . Chronic kidney disease, stage III (moderate) (HCC)   . Adnexal mass     Expected Discharge Date: Expected Discharge Date: 08/22/17  Team Members Present: Physician leading conference: Dr. Alger Simons Social Worker Present: Lennart Pall, LCSW Nurse Present: Leonette Nutting, RN PT Present: Roderic Ovens, PT OT Present: Benay Pillow, OT SLP Present: Weston Anna, SLP PPS Coordinator present : Daiva Nakayama, RN, CRRN     Current Status/Progress Goal Weekly Team Focus  Medical   Patient with improved p.o. intake.  Pain improved as well.  Sleep has normalized.  Continue to maximize functional mobility  Encourage appropriate nutrition, control blood pressure   Bowel/Bladder   Continent of bladder/bowel  Maintain continence   Continue to assess QS/PRN provide schedule meds as needed fr constipation and diarreal secondary to IBS   Swallow/Nutrition/ Hydration   Regular textures with thin liquids, supervision   Supervision  Tolerance of diet upgrade, family education    ADL's   Mod A bathing w/c level at sink, Min A UB dressing, Mod A LB dressing, Min A functional transfers at ambulatory level using RW  min A overall, Supervision for grooming and UB self care  Self care retraining, balance, functional transfers, cognition, pt/family education  Mobility   supervision  supervision  family ed   Communication             Safety/Cognition/ Behavioral Observations  Min-Mod A  Min-Mod A  recall with use of strategies, problem solving, family education    Pain   Denies pain  < 3  Assess QS/PRN for discomfort or c/o with f/u assessment, notify medical team if no relief   Skin   Edema remains to left hand elevated throughout shift   Maintain skin intergument  Assess and elevate left  hand for discomfort or clncal changes      *See Care Plan and progress notes for long and short-term goals.     Barriers to Discharge  Current Status/Progress Possible Resolutions Date Resolved   Physician    Medical stability        Continue medical management as outlined in the chart      Nursing                  PT                    OT                  SLP                SW                Discharge Planning/Teaching Needs:  Pt to d/c home with daughters and family sharing in provision of 24/7 supervision/ assistance.  Have scheduled family ed for Wed afternoon   Team Discussion:  Medically stable and improved.  Supervision goals being met and family ed to be completed tomorrow afternoon then [facility]  Revisions to Treatment Plan:  None    Continued Need for Acute Rehabilitation Level of Care: The patient requires daily medical management by a physician with specialized training in physical medicine and rehabilitation for the following conditions: Daily direction of a multidisciplinary physical rehabilitation program to ensure safe treatment while eliciting the highest outcome that is of practical value to the patient.: Yes Daily medical management of patient stability for increased activity during participation in an intensive rehabilitation regime.: Yes Daily analysis of laboratory values and/or radiology reports with any subsequent need for medication adjustment of medical intervention for : Neurological problems;Blood pressure problems  Neyland Pettengill 08/22/2017, 10:47 AM

## 2017-08-22 NOTE — Progress Notes (Signed)
Physical Therapy Discharge Summary  Patient Details  Name: NALEAH KOFOED MRN: [id number] Date of Birth: 1945-08-15    Patient has met all long term goals due to improved activity tolerance, improved balance, improved postural control, increased strength, ability to compensate for deficits, improved attention, improved awareness and improved coordination.  Patient to discharge at an ambulatory level Supervision.   Patient's care partner is independent to provide the necessary cognitive assistance at discharge.  Reasons goals not met: n/a  Recommendation:  Patient will benefit from ongoing skilled PT services in home health setting to continue to advance safe functional mobility, address ongoing impairments in strength, balance, and minimize fall risk.  Equipment: RW  Reasons for discharge: treatment goals met and discharge from hospital  Patient/family agrees with progress made and goals achieved: Yes  PT Discharge Precautions/Restrictions Precautions Precautions: Fall Restrictions Weight Bearing Restrictions: No  Cognition Overall Cognitive Status: Impaired/Different from baseline Arousal/Alertness: Awake/alert Orientation Level: Oriented X4 Focused Attention: Appears intact Selective Attention: Impaired Selective Attention Impairment: Verbal basic;Functional basic Memory: Impaired Memory Impairment: Decreased recall of new information;Decreased short term memory Awareness: Impaired Awareness Impairment: Emergent impairment Problem Solving: Impaired Problem Solving Impairment: Functional basic Safety/Judgment: Impaired Sensation Sensation Light Touch: Appears Intact Additional Comments: L UE with increased edema secondary to blood transfusion Iv Coordination Gross Motor Movements are Fluid and Coordinated: Yes Fine Motor Movements are Fluid and Coordinated: Yes Motor  Motor Motor - Discharge Observations: weakness   Trunk/Postural Assessment  Cervical  Assessment Cervical Assessment: Within Functional Limits Thoracic Assessment Thoracic Assessment: Within Functional Limits Lumbar Assessment Lumbar Assessment: Within Functional Limits Postural Control Righting Reactions: Delayed Protective Responses: Delayed  Balance Dynamic Standing Balance Dynamic Standing - Comments: supervision Extremity Assessment      RLE Strength RLE Overall Strength Comments: grossly 3+/5 LLE Strength LLE Overall Strength Comments: grossly 3+/5   See Function Navigator for Current Functional Status.  DONAWERTH,KAREN 08/22/2017, 8:16 AM

## 2017-08-22 NOTE — Progress Notes (Signed)
Patient discharged to home per wheelchair  accompanied by daughter and NT. Discharge instructions done by Linna Hoff PA no further questions noted.

## 2017-08-25 ENCOUNTER — Encounter (HOSPITAL_COMMUNITY): Payer: Self-pay | Admitting: Emergency Medicine

## 2017-08-25 ENCOUNTER — Other Ambulatory Visit: Payer: Self-pay

## 2017-08-25 ENCOUNTER — Observation Stay (HOSPITAL_COMMUNITY)
Admission: EM | Admit: 2017-08-25 | Discharge: 2017-08-26 | Disposition: A | Discharge status: Home Health | Payer: Medicare Other | Attending: Internal Medicine | Admitting: Internal Medicine

## 2017-08-25 DIAGNOSIS — D631 Anemia in chronic kidney disease: Secondary | ICD-10-CM | POA: Diagnosis not present

## 2017-08-25 DIAGNOSIS — Z853 Personal history of malignant neoplasm of breast: Secondary | ICD-10-CM | POA: Insufficient documentation

## 2017-08-25 DIAGNOSIS — I872 Venous insufficiency (chronic) (peripheral): Secondary | ICD-10-CM | POA: Insufficient documentation

## 2017-08-25 DIAGNOSIS — Z923 Personal history of irradiation: Secondary | ICD-10-CM | POA: Insufficient documentation

## 2017-08-25 DIAGNOSIS — Z8249 Family history of ischemic heart disease and other diseases of the circulatory system: Secondary | ICD-10-CM | POA: Insufficient documentation

## 2017-08-25 DIAGNOSIS — Z794 Long term (current) use of insulin: Secondary | ICD-10-CM | POA: Insufficient documentation

## 2017-08-25 DIAGNOSIS — E11649 Type 2 diabetes mellitus with hypoglycemia without coma: Principal | ICD-10-CM | POA: Insufficient documentation

## 2017-08-25 DIAGNOSIS — N183 Chronic kidney disease, stage 3 unspecified: Secondary | ICD-10-CM | POA: Diagnosis present

## 2017-08-25 DIAGNOSIS — I5022 Chronic systolic (congestive) heart failure: Secondary | ICD-10-CM | POA: Insufficient documentation

## 2017-08-25 DIAGNOSIS — N189 Chronic kidney disease, unspecified: Secondary | ICD-10-CM

## 2017-08-25 DIAGNOSIS — I10 Essential (primary) hypertension: Secondary | ICD-10-CM | POA: Diagnosis present

## 2017-08-25 DIAGNOSIS — Z972 Presence of dental prosthetic device (complete) (partial): Secondary | ICD-10-CM | POA: Insufficient documentation

## 2017-08-25 DIAGNOSIS — I129 Hypertensive chronic kidney disease with stage 1 through stage 4 chronic kidney disease, or unspecified chronic kidney disease: Secondary | ICD-10-CM | POA: Diagnosis not present

## 2017-08-25 DIAGNOSIS — Z9079 Acquired absence of other genital organ(s): Secondary | ICD-10-CM | POA: Diagnosis not present

## 2017-08-25 DIAGNOSIS — E669 Obesity, unspecified: Secondary | ICD-10-CM | POA: Diagnosis not present

## 2017-08-25 DIAGNOSIS — Z90710 Acquired absence of both cervix and uterus: Secondary | ICD-10-CM | POA: Diagnosis not present

## 2017-08-25 DIAGNOSIS — E162 Hypoglycemia, unspecified: Secondary | ICD-10-CM

## 2017-08-25 DIAGNOSIS — I2510 Atherosclerotic heart disease of native coronary artery without angina pectoris: Secondary | ICD-10-CM | POA: Diagnosis not present

## 2017-08-25 DIAGNOSIS — Z8673 Personal history of transient ischemic attack (TIA), and cerebral infarction without residual deficits: Secondary | ICD-10-CM | POA: Insufficient documentation

## 2017-08-25 DIAGNOSIS — Z9049 Acquired absence of other specified parts of digestive tract: Secondary | ICD-10-CM | POA: Insufficient documentation

## 2017-08-25 DIAGNOSIS — G8929 Other chronic pain: Secondary | ICD-10-CM | POA: Diagnosis not present

## 2017-08-25 DIAGNOSIS — E1142 Type 2 diabetes mellitus with diabetic polyneuropathy: Secondary | ICD-10-CM | POA: Insufficient documentation

## 2017-08-25 DIAGNOSIS — E860 Dehydration: Secondary | ICD-10-CM | POA: Diagnosis not present

## 2017-08-25 DIAGNOSIS — J302 Other seasonal allergic rhinitis: Secondary | ICD-10-CM | POA: Diagnosis not present

## 2017-08-25 DIAGNOSIS — Z90722 Acquired absence of ovaries, bilateral: Secondary | ICD-10-CM | POA: Insufficient documentation

## 2017-08-25 DIAGNOSIS — Z6829 Body mass index (BMI) 29.0-29.9, adult: Secondary | ICD-10-CM | POA: Insufficient documentation

## 2017-08-25 DIAGNOSIS — Z8674 Personal history of sudden cardiac arrest: Secondary | ICD-10-CM | POA: Insufficient documentation

## 2017-08-25 DIAGNOSIS — E1122 Type 2 diabetes mellitus with diabetic chronic kidney disease: Secondary | ICD-10-CM | POA: Insufficient documentation

## 2017-08-25 DIAGNOSIS — R634 Abnormal weight loss: Secondary | ICD-10-CM | POA: Insufficient documentation

## 2017-08-25 DIAGNOSIS — Z888 Allergy status to other drugs, medicaments and biological substances status: Secondary | ICD-10-CM | POA: Insufficient documentation

## 2017-08-25 DIAGNOSIS — Z955 Presence of coronary angioplasty implant and graft: Secondary | ICD-10-CM | POA: Insufficient documentation

## 2017-08-25 DIAGNOSIS — I130 Hypertensive heart and chronic kidney disease with heart failure and stage 1 through stage 4 chronic kidney disease, or unspecified chronic kidney disease: Secondary | ICD-10-CM | POA: Diagnosis not present

## 2017-08-25 DIAGNOSIS — Z7902 Long term (current) use of antithrombotics/antiplatelets: Secondary | ICD-10-CM | POA: Insufficient documentation

## 2017-08-25 DIAGNOSIS — Z862 Personal history of diseases of the blood and blood-forming organs and certain disorders involving the immune mechanism: Secondary | ICD-10-CM | POA: Diagnosis present

## 2017-08-25 DIAGNOSIS — Z885 Allergy status to narcotic agent status: Secondary | ICD-10-CM | POA: Insufficient documentation

## 2017-08-25 DIAGNOSIS — M329 Systemic lupus erythematosus, unspecified: Secondary | ICD-10-CM | POA: Diagnosis not present

## 2017-08-25 DIAGNOSIS — N179 Acute kidney failure, unspecified: Secondary | ICD-10-CM | POA: Diagnosis not present

## 2017-08-25 DIAGNOSIS — E1169 Type 2 diabetes mellitus with other specified complication: Secondary | ICD-10-CM | POA: Insufficient documentation

## 2017-08-25 DIAGNOSIS — W06XXXA Fall from bed, initial encounter: Secondary | ICD-10-CM | POA: Diagnosis not present

## 2017-08-25 DIAGNOSIS — Z7982 Long term (current) use of aspirin: Secondary | ICD-10-CM | POA: Insufficient documentation

## 2017-08-25 DIAGNOSIS — E785 Hyperlipidemia, unspecified: Secondary | ICD-10-CM | POA: Diagnosis not present

## 2017-08-25 DIAGNOSIS — I152 Hypertension secondary to endocrine disorders: Secondary | ICD-10-CM | POA: Diagnosis present

## 2017-08-25 DIAGNOSIS — Z79899 Other long term (current) drug therapy: Secondary | ICD-10-CM | POA: Insufficient documentation

## 2017-08-25 LAB — CBG MONITORING, ED
GLUCOSE-CAPILLARY: 125 mg/dL — AB (ref 65–99)
GLUCOSE-CAPILLARY: 69 mg/dL (ref 65–99)
Glucose-Capillary: 128 mg/dL — ABNORMAL HIGH (ref 65–99)

## 2017-08-25 LAB — URINALYSIS, ROUTINE W REFLEX MICROSCOPIC
BACTERIA UA: NONE SEEN
Bilirubin Urine: NEGATIVE
Glucose, UA: NEGATIVE mg/dL
HGB URINE DIPSTICK: NEGATIVE
KETONES UR: NEGATIVE mg/dL
NITRITE: NEGATIVE
PROTEIN: NEGATIVE mg/dL
Specific Gravity, Urine: 1.008 (ref 1.005–1.030)
pH: 5 (ref 5.0–8.0)

## 2017-08-25 LAB — CBC WITH DIFFERENTIAL/PLATELET
Abs Immature Granulocytes: 0.1 10*3/uL (ref 0.0–0.1)
BASOS ABS: 0 10*3/uL (ref 0.0–0.1)
Basophils Relative: 0 %
EOS PCT: 1 %
Eosinophils Absolute: 0.1 10*3/uL (ref 0.0–0.7)
HCT: 28.3 % — ABNORMAL LOW (ref 36.0–46.0)
Hemoglobin: 8.9 g/dL — ABNORMAL LOW (ref 12.0–15.0)
Immature Granulocytes: 1 %
LYMPHS PCT: 25 %
Lymphs Abs: 1.9 10*3/uL (ref 0.7–4.0)
MCH: 26.1 pg (ref 26.0–34.0)
MCHC: 31.4 g/dL (ref 30.0–36.0)
MCV: 83 fL (ref 78.0–100.0)
MONO ABS: 0.5 10*3/uL (ref 0.1–1.0)
MONOS PCT: 7 %
NEUTROS ABS: 5.1 10*3/uL (ref 1.7–7.7)
Neutrophils Relative %: 66 %
Platelets: 302 10*3/uL (ref 150–400)
RBC: 3.41 MIL/uL — ABNORMAL LOW (ref 3.87–5.11)
RDW: 19.5 % — ABNORMAL HIGH (ref 11.5–15.5)
WBC: 7.6 10*3/uL (ref 4.0–10.5)

## 2017-08-25 LAB — BASIC METABOLIC PANEL
Anion gap: 8 (ref 5–15)
BUN: 19 mg/dL (ref 6–20)
CALCIUM: 9.3 mg/dL (ref 8.9–10.3)
CO2: 23 mmol/L (ref 22–32)
Chloride: 109 mmol/L (ref 101–111)
Creatinine, Ser: 1.26 mg/dL — ABNORMAL HIGH (ref 0.44–1.00)
GFR calc Af Amer: 49 mL/min — ABNORMAL LOW (ref >60)
GFR, EST NON AFRICAN AMERICAN: 42 mL/min — AB (ref >60)
GLUCOSE: 75 mg/dL (ref 65–99)
Potassium: 4.9 mmol/L (ref 3.5–5.1)
Sodium: 140 mmol/L (ref 135–145)

## 2017-08-25 LAB — GLUCOSE, CAPILLARY
GLUCOSE-CAPILLARY: 126 mg/dL — AB (ref 65–99)
Glucose-Capillary: 115 mg/dL — ABNORMAL HIGH (ref 65–99)

## 2017-08-25 MED ORDER — ATORVASTATIN CALCIUM 20 MG PO TABS
40.0000 mg | ORAL_TABLET | Freq: Every day | ORAL | Status: DC
  Administered 2017-08-25: 40 mg via ORAL
  Filled 2017-08-25: qty 2

## 2017-08-25 MED ORDER — AMLODIPINE BESYLATE 5 MG PO TABS
10.0000 mg | ORAL_TABLET | Freq: Every day | ORAL | Status: DC
  Administered 2017-08-26: 10 mg via ORAL
  Filled 2017-08-25: qty 2

## 2017-08-25 MED ORDER — PANTOPRAZOLE SODIUM 40 MG PO TBEC
40.0000 mg | DELAYED_RELEASE_TABLET | Freq: Every day | ORAL | Status: DC
  Administered 2017-08-26: 40 mg via ORAL
  Filled 2017-08-25: qty 1

## 2017-08-25 MED ORDER — QUETIAPINE FUMARATE 50 MG PO TABS
75.0000 mg | ORAL_TABLET | Freq: Every day | ORAL | Status: DC
  Filled 2017-08-25: qty 1

## 2017-08-25 MED ORDER — DEXTROSE 5 % IV SOLN
INTRAVENOUS | Status: DC
Start: 2017-08-25 — End: 2017-08-26
  Administered 2017-08-25: 18:00:00 via INTRAVENOUS

## 2017-08-25 MED ORDER — CARVEDILOL 12.5 MG PO TABS
25.0000 mg | ORAL_TABLET | Freq: Two times a day (BID) | ORAL | Status: DC
  Administered 2017-08-25 – 2017-08-26 (×2): 25 mg via ORAL
  Filled 2017-08-25 (×2): qty 2

## 2017-08-25 MED ORDER — SENNOSIDES-DOCUSATE SODIUM 8.6-50 MG PO TABS: 1.0000 | ORAL_TABLET | Freq: Every evening | ORAL | Status: DC | PRN

## 2017-08-25 MED ORDER — POTASSIUM CHLORIDE CRYS ER 20 MEQ PO TBCR
20.0000 meq | EXTENDED_RELEASE_TABLET | Freq: Every day | ORAL | Status: DC
  Administered 2017-08-26: 20 meq via ORAL
  Filled 2017-08-25: qty 1

## 2017-08-25 MED ORDER — ENOXAPARIN SODIUM 40 MG/0.4ML ~~LOC~~ SOLN
40.0000 mg | SUBCUTANEOUS | Status: DC
  Administered 2017-08-25: 40 mg via SUBCUTANEOUS
  Filled 2017-08-25: qty 0.4

## 2017-08-25 MED ORDER — FAMOTIDINE 20 MG PO TABS
20.0000 mg | ORAL_TABLET | Freq: Every day | ORAL | Status: DC
  Administered 2017-08-26: 20 mg via ORAL
  Filled 2017-08-25: qty 1

## 2017-08-25 MED ORDER — ACETAMINOPHEN 325 MG PO TABS: 650.0000 mg | ORAL_TABLET | Freq: Four times a day (QID) | ORAL | Status: DC | PRN

## 2017-08-25 MED ORDER — ASPIRIN EC 81 MG PO TBEC
81.0000 mg | DELAYED_RELEASE_TABLET | Freq: Every day | ORAL | Status: DC
  Administered 2017-08-26: 81 mg via ORAL
  Filled 2017-08-25: qty 1

## 2017-08-25 MED ORDER — ONDANSETRON HCL 4 MG PO TABS: 4.0000 mg | ORAL_TABLET | Freq: Four times a day (QID) | ORAL | Status: DC | PRN

## 2017-08-25 MED ORDER — FEBUXOSTAT 40 MG PO TABS
40.0000 mg | ORAL_TABLET | Freq: Every day | ORAL | Status: DC
  Administered 2017-08-26: 40 mg via ORAL
  Filled 2017-08-25: qty 1

## 2017-08-25 MED ORDER — DEXTROSE 5 % IV SOLN
INTRAVENOUS | Status: DC
  Administered 2017-08-25: 16:00:00 via INTRAVENOUS

## 2017-08-25 MED ORDER — ANASTROZOLE 1 MG PO TABS
1.0000 mg | ORAL_TABLET | Freq: Every day | ORAL | Status: DC
  Administered 2017-08-26: 1 mg via ORAL
  Filled 2017-08-25: qty 1

## 2017-08-25 MED ORDER — LOSARTAN POTASSIUM 50 MG PO TABS
100.0000 mg | ORAL_TABLET | Freq: Every day | ORAL | Status: DC
  Administered 2017-08-26: 100 mg via ORAL
  Filled 2017-08-25: qty 2

## 2017-08-25 MED ORDER — FENOFIBRATE 54 MG PO TABS
54.0000 mg | ORAL_TABLET | Freq: Every day | ORAL | Status: DC
  Administered 2017-08-26: 54 mg via ORAL
  Filled 2017-08-25: qty 1

## 2017-08-25 MED ORDER — ONDANSETRON HCL 4 MG/2ML IJ SOLN: 4.0000 mg | Freq: Four times a day (QID) | INTRAMUSCULAR | Status: DC | PRN

## 2017-08-25 MED ORDER — DEXTROSE 5 % IV SOLN: Freq: Once | INTRAVENOUS | Status: DC

## 2017-08-25 MED ORDER — LORATADINE 10 MG PO TABS
10.0000 mg | ORAL_TABLET | Freq: Every day | ORAL | Status: DC | PRN
Start: 2017-08-25 — End: 2017-08-26

## 2017-08-25 MED ORDER — FUROSEMIDE 40 MG PO TABS: 40.0000 mg | ORAL_TABLET | Freq: Every day | ORAL | Status: DC | PRN

## 2017-08-25 MED ORDER — ENOXAPARIN SODIUM 40 MG/0.4ML ~~LOC~~ SOLN: 40.0000 mg | SUBCUTANEOUS | Status: DC

## 2017-08-25 MED ORDER — DEXTROSE 50 % IV SOLN
1.0000 | INTRAVENOUS | Status: DC | PRN
  Administered 2017-08-25: 50 mL via INTRAVENOUS
  Filled 2017-08-25: qty 50

## 2017-08-25 MED ORDER — FUROSEMIDE 20 MG PO TABS: 40.0000 mg | ORAL_TABLET | Freq: Every day | ORAL | Status: DC

## 2017-08-25 MED ORDER — FERROUS FUMARATE 324 (106 FE) MG PO TABS
1.0000 | ORAL_TABLET | Freq: Every day | ORAL | Status: DC
  Administered 2017-08-26: 106 mg via ORAL
  Filled 2017-08-25 (×3): qty 1

## 2017-08-25 MED ORDER — HYDRALAZINE HCL 10 MG PO TABS
10.0000 mg | ORAL_TABLET | Freq: Three times a day (TID) | ORAL | Status: DC
  Administered 2017-08-25 – 2017-08-26 (×3): 10 mg via ORAL
  Filled 2017-08-25 (×4): qty 1

## 2017-08-25 NOTE — ED Notes (Signed)
Pt CBG, 128 RN notified.

## 2017-08-25 NOTE — Progress Notes (Signed)
Pt admitted to unit, family at bedside. MD up to speak with patient and family.  Started D5@75mL /hr per MAR. FS before starting was 125, will check q4 hours.  Skin assessed on admission: elbows, heels, knees intact, buttocks dry with fissure medially. Left hand +1 edema noted; pt and family state has been that way since last admission to hospital (patient was discharged only a few days ago)  Patient oriented to bed controls, call light. Pt is high fall risk due to being found down by family. Explained fall precautions to patient and family; they verbalized understanding.

## 2017-08-25 NOTE — ED Notes (Signed)
Pt ambulated to bathroom x1 assist.

## 2017-08-25 NOTE — ED Provider Notes (Signed)
[facility] EMERGENCY DEPARTMENT Provider Note   CSN: [id number] Arrival date & time: 08/25/17  0930     History   Chief Complaint Chief Complaint  Patient presents with  . Hypoglycemia    HPI [name] is a 72 y.o. female.  HPI She reportedly was found on the floor this morning.  She was less responsive.  Her blood sugar was noted to be 54, cosistent with hypoglycemia.  She was administered D10 by EMS.  She gained consciousness.  She last took Levemir insulin 2 nights ago.  Insulin was withheld last night due to blood sugar of 87, at 11 PM last night..  She presently feels well except for slight right arm pain which she has had for several days.  Patient was discharged from here 08/22/2017 as result of encephalopathy with cardiogenic shock Past Medical History:  Diagnosis Date  . Abdominal discomfort    Chronic N/V/D. Presumptive dx Crohn's dx per elevated p ANCA. Failed Entocort and Pentasa. Sep 2003 - ileocolectomy c anastomosis per Dr Deon Pilling 2/2 adhesions - path was hegative for Crohns. EGD, Sm bowel follow through (11/03), and an eteroclysis (10/03) were unrevealing. Cuases hypomag and hypocalcemia.  . Adnexal mass 8/03   s/p lap BSO (R ovarian fibroma) & lysis of adhesions  . Allergy    Seasonal  . Anemia    Multifactorial. Baseline HgB 10-11 ish. B12 def - 150 in 3/10. Fe Def - ferritin 35 3/10. Both are being repleted.  . Breast cancer ([place]) 03/16/13   right, 5 o'clock  . CAD (coronary artery disease) 1996   1996 - PTCA and angioplasty diagonal branch. 2000 - Rotoblator & angiopllasty of diagonal. 2006 - subendocardial AMI, DES to proximal LAD.Marland Kitchen Also had 90% stenosis in distal apical LAD. EF 55 with apical hypokinesis. Indefinite ASA and Plavix.  . CHF (congestive heart failure) ([place])   . Chronic kidney disease    Chronic renal insuff baseline Cr 1.2 - 1.4 ish.  . Chronic pain    CT 10/10 = Spinal stenosis L2 - S1.  . Diabetes mellitus    Insulin  dependent  . Hx of radiation therapy 06/02/13- 07/16/13   right rbeast 4500 cGy 25 sessions, right breast boost 1600 cGy in 8 sessions  . Hyperlipidemia    Managed with both a statin and Welchol. Welchol stopped 2014 2/2 cost and started on fenofibrate   . Hypertension    2006 B renal arteries patent. 2003 MRA - no RAS. 2003 pheo W/U Dr Hassell Done reportedly negative.  . Hypoxia 07/23/2017  . Lupus ([place])   . Personal history of radiation therapy   . RBBB   . Stroke [facility])    Incidental finding MRI 2002 L lacunar infarct  . Wears dentures    top    Patient Active Problem List   Diagnosis Date Noted  . Hypokalemia   . Labile blood glucose   . Sleep disturbance   . Acute blood loss anemia   . Leukocytosis   . Essential hypertension   . Urinary frequency   . Type 2 diabetes mellitus with peripheral neuropathy (HCC)   . Benign essential HTN   . Hypertensive crisis   . Stage 2 chronic kidney disease   . Dysphagia   . ARDS (adult respiratory distress syndrome) ([place]) 08/06/2017  . Debility   . Encephalopathy   . Pressure injury of skin 08/04/2017  . Pneumonia of left lung due to infectious organism   . Acute respiratory  failure with hypoxemia ([place])   . Cardiac arrest, cause unspecified ([place])   . Shock circulatory ([place])   . Septic shock ([place])   . HCAP (healthcare-associated pneumonia)   . Diffuse pulmonary alveolar hemorrhage   . Chest pain 07/23/2017  . Hypoxia 07/23/2017  . SOB (shortness of breath) 10/11/2016  . Hyperparathyroidism, secondary renal ([place]) 05/02/2016  . Gout 04/11/2015  . Diabetic neuropathy associated with type 2 diabetes mellitus ([place] [employer]) 01/06/2015  . Breast cancer of lower-inner quadrant of right female breast ([place]) 03/18/2013  . Chronic venous insufficiency 01/06/2013  . Health care maintenance 05/08/2011  . Diabetes mellitus type 2 in obese ([place]) 05/03/2010  . Seasonal allergies 05/03/2010  . Labile hypertension   . CAD (coronary artery disease)   . Abdominal  discomfort   . Anemia in chronic kidney disease   . History of stroke without residual deficits   . Hyperlipidemia associated with type 2 diabetes mellitus ([place])   . Chronic pain   . Chronic kidney disease, stage III (moderate) (HCC)   . Adnexal mass     Past Surgical History:  Procedure Laterality Date  . ABDOMINAL HYSTERECTOMY    . BILATERAL SALPINGOOPHORECTOMY  8/03   Lap BSO (R ovarian fibroma) and adhesion lysis  . BOWEL RESECTION  2003   ileocolectomy with anastomosis 2/2 adhesions  . BREAST BIOPSY    . BREAST LUMPECTOMY Right   . BREAST LUMPECTOMY WITH NEEDLE LOCALIZATION AND AXILLARY SENTINEL LYMPH NODE BX Right 04/22/2013   Procedure: BREAST LUMPECTOMY WITH NEEDLE LOCALIZATION AND AXILLARY SENTINEL LYMPH NODE BX;  Surgeon: Stark Klein, MD;  Location: [facility];  Service: General;  Laterality: Right;  . CARDIAC CATHETERIZATION     2 stents  . CHOLECYSTECTOMY    . HEMICOLECTOMY     R sided hemicolectomy  . HERNIA REPAIR     Ventral hernia repair  . PTCA  4/06     OB History   None      Home Medications    Prior to Admission medications   Medication Sig Start Date End Date Taking? Authorizing Provider  acetaminophen (TYLENOL) 325 MG tablet Take 650 mg by mouth every 6 (six) hours as needed for mild pain.   Yes [provider]  amLODipine (NORVASC) 10 MG tablet Take 1 tablet (10 mg total) by mouth daily. 08/22/17  Yes Angiulli, Lavon Paganini, PA-C  anastrozole (ARIMIDEX) 1 MG tablet Take 1 tablet (1 mg total) by mouth daily. 12/19/16  Yes Truitt Merle, MD  aspirin EC 81 MG tablet Take 81 mg by mouth daily.   Yes [provider]  atorvastatin (LIPITOR) 40 MG tablet Take 1 tablet (40 mg total) by mouth at bedtime. 08/22/17  Yes Angiulli, Lavon Paganini, PA-C  carvedilol (COREG) 25 MG tablet Take 1 tablet (25 mg total) by mouth 2 (two) times daily with a meal. 08/22/17  Yes Angiulli, Lavon Paganini, PA-C  cyanocobalamin (,VITAMIN B-12,) 1000 MCG/ML injection  Inject 1 mL (1,000 mcg total) into the muscle every 30 (thirty) days. 09/12/16  Yes Bartholomew Crews, MD  desloratadine (CLARINEX) 5 MG tablet Take 5 mg by mouth at bedtime as needed (allergies). Reported on 04/11/2015 04/10/13  Yes [provider]  famotidine (PEPCID) 20 MG tablet Take 1 tablet (20 mg total) by mouth daily. 08/22/17  Yes Angiulli, Lavon Paganini, PA-C  fenofibrate 54 MG tablet Take 54 mg by mouth daily.  09/21/15  Yes [provider]  ferrous fumarate (HEMOCYTE - 106 MG FE)  325 (106 Fe) MG TABS tablet Take 1 tablet (106 mg of iron total) by mouth daily. 08/22/17  Yes Angiulli, Lavon Paganini, PA-C  furosemide (LASIX) 40 MG tablet Take 1 tablet (40 mg total) by mouth daily. 08/22/17  Yes Angiulli, Lavon Paganini, PA-C  hydrALAZINE (APRESOLINE) 10 MG tablet Take 1 tablet (10 mg total) by mouth every 8 (eight) hours. 08/22/17  Yes Angiulli, Lavon Paganini, PA-C  insulin glargine (LANTUS) 100 unit/mL SOPN Inject 0.15 mLs (15 Units total) into the skin daily. 08/22/17  Yes Angiulli, Lavon Paganini, PA-C  loperamide (IMODIUM) 2 MG capsule TAKE ONE CAPSULE BY MOUTH ONCE DAILY AS NEEDED FOR DIARRHEA OR LOOSE STOOLS 05/24/14  Yes Bartholomew Crews, MD  losartan (COZAAR) 100 MG tablet Take 1 tablet (100 mg total) by mouth daily. 08/22/17  Yes Angiulli, Lavon Paganini, PA-C  Melatonin 3 MG TABS Take 0.5 tablets (1.5 mg total) by mouth at bedtime. 08/22/17  Yes Angiulli, Lavon Paganini, PA-C  nitroGLYCERIN (NITROSTAT) 0.4 MG SL tablet Place 1 tablet (0.4 mg total) under the tongue every 5 (five) minutes as needed for chest pain. 12/06/16  Yes Josue Hector, MD  pantoprazole (PROTONIX) 40 MG tablet Take 1 tablet (40 mg total) by mouth daily. 08/22/17  Yes Angiulli, Lavon Paganini, PA-C  potassium chloride SA (K-DUR,KLOR-CON) 20 MEQ tablet Take 1 tablet (20 mEq total) by mouth daily. 08/22/17  Yes Angiulli, Lavon Paganini, PA-C  QUEtiapine (SEROQUEL) 25 MG tablet Take 3 tablets (75 mg total) by mouth at bedtime. 08/22/17  Yes Angiulli, Lavon Paganini, PA-C    ULORIC 40 MG tablet Take 1 tablet by mouth daily. 06/20/16  Yes [provider]  clopidogrel (PLAVIX) 75 MG tablet Take 1 tablet (75 mg total) by mouth daily. Patient not taking: Reported on 08/25/2017 08/22/17   Angiulli, Lavon Paganini, PA-C  QUEtiapine (SEROQUEL) 50 MG tablet Take 1 tablet (50 mg total) by mouth at bedtime. Patient not taking: Reported on 08/25/2017 08/06/17   Oswald Hillock, MD    Family History Family History  Problem Relation Age of Onset  . Pancreatic cancer Brother 58  . Cancer Mother   . Breast cancer Mother 42  . Lung cancer Maternal Aunt   . Breast cancer Maternal Aunt   . Prostate cancer Maternal Uncle   . Heart attack Maternal Grandmother   . Breast cancer Maternal Grandmother   . Kidney failure Maternal Aunt   . Hypertension Daughter     Social History Social History   Tobacco Use  . Smoking status: Never Smoker  . Smokeless tobacco: Never Used  Substance Use Topics  . Alcohol use: No    Alcohol/week: 0.0 oz  . Drug use: No     Allergies   Percocet [oxycodone-acetaminophen]; Diazepam; Haloperidol lactate; Lorazepam; Morphine sulfate; Propoxyphene hcl; and Tramadol hcl   Review of Systems Review of Systems  Musculoskeletal: Positive for gait problem and myalgias.       Right arm pain.  Walks with walker  Allergic/Immunologic: Positive for immunocompromised state.       Diabetic  All other systems reviewed and are negative.    Physical Exam Updated Vital Signs BP (!) 153/69   Pulse 88   Temp 98.6 F (37 C) (Oral)   Resp 14   Ht 5' 4"  (1.626 m)   Wt 79.4 kg (175 lb)   SpO2 100%   BMI 30.04 kg/m   Physical Exam  Constitutional: She is oriented to person, place, and time. No distress.  Chronically ill-appearing  HENT:  Head: Normocephalic and atraumatic.  Eyes: Pupils are equal, round, and reactive to light. Conjunctivae are normal.  Neck: Neck supple. No tracheal deviation present. No thyromegaly present.  Cardiovascular:  Normal rate and regular rhythm.  No murmur heard. Pulmonary/Chest: Effort normal and breath sounds normal.  Abdominal: Soft. Bowel sounds are normal. She exhibits no distension. There is no tenderness.  Obese  Musculoskeletal: Normal range of motion. She exhibits no edema or tenderness.  All 4 extremities no redness swelling or tenderness neurovascular intact.  Pelvis stable nontender  Neurological: She is alert and oriented to person, place, and time. No cranial nerve deficit. Coordination normal.  Skin: Skin is warm and dry. Capillary refill takes less than 2 seconds. No rash noted.  Psychiatric: She has a normal mood and affect.  Nursing note and vitals reviewed.    ED Treatments / Results  Labs (all labs ordered are listed, but only abnormal results are displayed) Labs Reviewed  CBG MONITORING, ED - Abnormal; Notable for the following components:      Result Value   Glucose-Capillary 128 (*)    All other components within normal limits  CBG MONITORING, ED - Abnormal; Notable for the following components:   Glucose-Capillary 125 (*)    All other components within normal limits  URINALYSIS, ROUTINE W REFLEX MICROSCOPIC  BASIC METABOLIC PANEL  CBC WITH DIFFERENTIAL/PLATELET  URINALYSIS, ROUTINE W REFLEX MICROSCOPIC    EKG EKG Interpretation  Date/Time:  Sunday August 25 2017 09:49:44 EDT Ventricular Rate:  87 PR Interval:  128 QRS Duration: 132 QT Interval:  388 QTC Calculation: 466 R Axis:   -29 Text Interpretation:  Normal sinus rhythm Right bundle branch block Abnormal ECG No significant change since last tracing Confirmed by Quintella Reichert [phone]) on 08/25/2017 9:53:36 AM  Results for orders placed or performed during the hospital encounter of 08/25/17  Urinalysis, Routine w reflex microscopic  Result Value Ref Range   Color, Urine YELLOW YELLOW   APPearance CLEAR CLEAR   Specific Gravity, Urine 1.008 1.005 - 1.030   pH 5.0 5.0 - 8.0   Glucose, UA NEGATIVE NEGATIVE  mg/dL   Hgb urine dipstick NEGATIVE NEGATIVE   Bilirubin Urine NEGATIVE NEGATIVE   Ketones, ur NEGATIVE NEGATIVE mg/dL   Protein, ur NEGATIVE NEGATIVE mg/dL   Nitrite NEGATIVE NEGATIVE   Leukocytes, UA MODERATE (A) NEGATIVE   WBC, UA 0-5 0 - 5 WBC/hpf   Bacteria, UA NONE SEEN NONE SEEN   Squamous Epithelial / LPF 6-10 0 - 5   Hyaline Casts, UA PRESENT   Basic metabolic panel  Result Value Ref Range   Sodium 140 135 - 145 mmol/L   Potassium 4.9 3.5 - 5.1 mmol/L   Chloride 109 101 - 111 mmol/L   CO2 23 22 - 32 mmol/L   Glucose, Bld 75 65 - 99 mg/dL   BUN 19 6 - 20 mg/dL   Creatinine, Ser 1.26 (H) 0.44 - 1.00 mg/dL   Calcium 9.3 8.9 - 10.3 mg/dL   GFR calc non Af Amer 42 (L) >60 mL/min   GFR calc Af Amer 49 (L) >60 mL/min   Anion gap 8 5 - 15  CBC with Differential/Platelet  Result Value Ref Range   WBC 7.6 4.0 - 10.5 K/uL   RBC 3.41 (L) 3.87 - 5.11 MIL/uL   Hemoglobin 8.9 (L) 12.0 - 15.0 g/dL   HCT 28.3 (L) 36.0 - 46.0 %   MCV 83.0 78.0 - 100.0 fL  MCH 26.1 26.0 - 34.0 pg   MCHC 31.4 30.0 - 36.0 g/dL   RDW 19.5 (H) 11.5 - 15.5 %   Platelets 302 150 - 400 K/uL   Neutrophils Relative % 66 %   Neutro Abs 5.1 1.7 - 7.7 K/uL   Lymphocytes Relative 25 %   Lymphs Abs 1.9 0.7 - 4.0 K/uL   Monocytes Relative 7 %   Monocytes Absolute 0.5 0.1 - 1.0 K/uL   Eosinophils Relative 1 %   Eosinophils Absolute 0.1 0.0 - 0.7 K/uL   Basophils Relative 0 %   Basophils Absolute 0.0 0.0 - 0.1 K/uL   Immature Granulocytes 1 %   Abs Immature Granulocytes 0.1 0.0 - 0.1 K/uL  Glucose, capillary  Result Value Ref Range   Glucose-Capillary 126 (H) 65 - 99 mg/dL  CBG monitoring, ED  Result Value Ref Range   Glucose-Capillary 128 (H) 65 - 99 mg/dL  CBG monitoring, ED  Result Value Ref Range   Glucose-Capillary 125 (H) 65 - 99 mg/dL   Comment 1 Notify RN    Comment 2 Document in Chart   CBG monitoring, ED  Result Value Ref Range   Glucose-Capillary 69 65 - 99 mg/dL   Dg Chest 1  View  Result Date: 08/03/2017 CLINICAL DATA:  Shortness of Breath EXAM: CHEST  1 VIEW COMPARISON:  07/31/2017 FINDINGS: Diffuse left lung airspace opacity again noted, most confluent in the left upper lobe compatible with pneumonia. Right lung clear. No effusions. Heart is mildly enlarged. Left central line is unchanged. Interval removal of endotracheal tube and NG tube. IMPRESSION: Left lung airspace disease again noted most compatible with pneumonia. No significant change. Cardiomegaly. Electronically Signed   By: Rolm Baptise M.D.   On: 08/03/2017 16:06   Dg Abd 1 View  Result Date: 08/03/2017 CLINICAL DATA:  Ileus. EXAM: ABDOMEN - 1 VIEW COMPARISON:  07/31/2017 and prior exams FINDINGS: Colonic distension has decreased. A small amount of oral contrast within the colon noted. No dilated small bowel loops are present. No acute bony abnormalities are identified. IMPRESSION: Decreased colonic distention.  No evidence of bowel obstruction. Electronically Signed   By: Margarette Canada M.D.   On: 08/03/2017 16:06   Dg Abd 1 View  Result Date: 07/29/2017 CLINICAL DATA:  Abdominal distension. EXAM: ABDOMEN - 1 VIEW COMPARISON:  07/20/2016 FINDINGS: Dilated small bowel loops are again noted compatible with small bowel obstruction. NG tube tip is in the distal stomach or proximal duodenum. Prior cholecystectomy. Overall bowel dilatation has increased since prior study. No free air. IMPRESSION: NG tube tip in the distal stomach or proximal duodenum. Worsening small bowel distention compatible with small bowel obstruction. Electronically Signed   By: Rolm Baptise M.D.   On: 07/29/2017 10:27   Ct Head Wo Contrast  Result Date: 08/09/2017 CLINICAL DATA:  Confusion.  History of breast carcinoma EXAM: CT HEAD WITHOUT CONTRAST TECHNIQUE: Contiguous axial images were obtained from the base of the skull through the vertex without intravenous contrast. COMPARISON:  May 25, 2003 FINDINGS: Brain: The ventricles are normal in  size and configuration. There is a prominent cavum septum pellucidum, an anatomic variant. There is no intracranial mass, hemorrhage, extra-axial fluid collection, or midline shift. There is evidence of a prior small infarct involving a portion of the anterior limb of the left internal capsule and adjacent anterior lentiform nucleus on the left. There is a prior small infarct in the anterior left centrum semiovale. Elsewhere, there is minimal periventricular small vessel  disease. No acute infarct is evident. Mild basal ganglia calcification is felt to be physiologic. Vascular: No hyperdense vessel. There is calcification in each carotid siphon region. Skull: The bony calvarium appears intact. Sinuses/Orbits: There is mucosal thickening in several ethmoid air cells bilaterally. Other visualized paranasal sinuses are clear. Visualized orbits appear symmetric bilaterally. Other: There is opacification in several inferior mastoid air cells bilaterally. Mild debris in each external auditory canal. IMPRESSION: Prior infarcts in the left basal ganglia and anterior left centrum semiovale. Slight periventricular small vessel disease. No acute infarct. No mass or hemorrhage. Foci of arterial vascular calcification noted. Mucosal thickening in several ethmoid air cells. Opacification in several inferior mastoid air cells bilaterally noted. There is probable cerumen in each external auditory canal. Electronically Signed   By: Lowella Grip III M.D.   On: 08/09/2017 17:26   Dg Chest Port 1 View  Result Date: 07/31/2017 CLINICAL DATA:  Shortness of breath. EXAM: PORTABLE CHEST 1 VIEW COMPARISON:  Radiograph of Jul 30, 2017. FINDINGS: Stable cardiomediastinal silhouette. Endotracheal and nasogastric tubes are unchanged in position. Left subclavian catheter is unchanged in position. No pneumothorax is noted. Stable diffuse left lung opacity is noted concerning for pneumonia or edema. Stable mild right basilar subsegmental  atelectasis is noted. Bony thorax is unremarkable. IMPRESSION: Stable support apparatus. Stable diffuse left lung opacity concerning for pneumonia or edema. Stable mild right basilar subsegmental atelectasis. Electronically Signed   By: Marijo Conception, M.D.   On: 07/31/2017 07:57   Dg Chest Port 1 View  Result Date: 07/30/2017 CLINICAL DATA:  History of endotracheal tube EXAM: PORTABLE CHEST 1 VIEW COMPARISON:  07/29/2017 FINDINGS: Endotracheal tube tip at the clavicular heads. Left subclavian line with tip at the SVC. An orogastric tube reaches the stomach at least. Cardiomegaly. Coronary stent present. Asymmetric airspace disease to the left. Postoperative right axilla. IMPRESSION: 1. History of pneumonia with unchanged left more than right airspace disease. 2. Stable hardware positioning. Electronically Signed   By: Monte Fantasia M.D.   On: 07/30/2017 07:48   Dg Chest Port 1 View  Result Date: 07/29/2017 CLINICAL DATA:  Follow-up pneumonia EXAM: PORTABLE CHEST 1 VIEW COMPARISON:  07/28/2017 FINDINGS: Endotracheal tube, nasogastric catheter and left subclavian central line are again seen and stable. Diffuse infiltrative changes noted throughout the left lung with mild basilar changes on the right. The overall appearance is stable from the prior study. No new focal abnormality is noted. IMPRESSION: No significant interval change from the previous day. Electronically Signed   By: Inez Catalina M.D.   On: 07/29/2017 07:08   Dg Chest Port 1 View  Result Date: 07/28/2017 CLINICAL DATA:  Pneumonia, coronary artery disease, history breast cancer, hypertension, lupus EXAM: PORTABLE CHEST 1 VIEW COMPARISON:  Portable exam 0654 hours compared to 07/27/2017 FINDINGS: Tip of endotracheal tube projects 4.3 cm above carina. Nasogastric tube extends into stomach. LEFT subclavian central venous catheter tip projects over SVC. Upper normal heart size. Extensive airspace infiltrate throughout LEFT lung especially LEFT  upper lobe. Mild RIGHT basilar atelectasis. Increasing infiltrate within RIGHT lung. No pleural effusion or pneumothorax. Surgical clips RIGHT axilla. Bones unremarkable. IMPRESSION: Persistent LEFT and increasing RIGHT pulmonary infiltrates. Mild RIGHT basilar atelectasis. Electronically Signed   By: Lavonia Dana M.D.   On: 07/28/2017 08:09   Dg Chest Port 1 View  Result Date: 07/27/2017 CLINICAL DATA:  Pneumonia. EXAM: PORTABLE CHEST 1 VIEW COMPARISON:  Jul 26, 2017 FINDINGS: The ETT and left central line are stable. The NG  tube terminates below today's film. No pneumothorax. A diffuse opacity in the left lung is stable. No focal opacity on the right. Stable cardiomediastinal silhouette. IMPRESSION: 1. Support apparatus as above. 2. Persistent diffuse opacity throughout the left lung. Recommend follow-up to complete resolution. Electronically Signed   By: Dorise Bullion III M.D   On: 07/27/2017 08:14   Dg Abd Portable 1v  Result Date: 07/31/2017 CLINICAL DATA:  Abdominal distension. EXAM: PORTABLE ABDOMEN - 1 VIEW COMPARISON:  Radiographs of Jul 30, 2017. FINDINGS: Nasogastric tube is in expected position of duodenum. Status post cholecystectomy. No significant small bowel dilatation is noted. Stable gaseous distention of the colon is noted. Stool is noted in the rectum. IMPRESSION: Stable colonic distention. No definite evidence of bowel obstruction. Electronically Signed   By: Marijo Conception, M.D.   On: 07/31/2017 07:56   Dg Abd Portable 1v  Result Date: 07/30/2017 CLINICAL DATA:  Small-bowel obstruction, abdominal distension. EXAM: PORTABLE ABDOMEN - 1 VIEW COMPARISON:  Abdominal radiographs of Jul 29, 2017 FINDINGS: The esophagogastric tubes tip and proximal port lie within the duodenum. There remains moderate gaseous distention of colonic loops. No significant small bowel obstructive pattern is observed. There is some stool and gas in the rectum. No free extraluminal gas collections are observed.  There are no abnormal soft tissue calcifications. There are degenerative changes of the lower lumbar spine. There surgical clips in the gallbladder fossa and at the right lung base. IMPRESSION: Decreased gaseous distention of small-bowel loops on today's study. Persistent mild gaseous distention of colonic loops. No free extraluminal gas collections. Electronically Signed   By: David  [name] M.D.   On: 07/30/2017 09:14   Dg Swallowing Func-speech Pathology  Result Date: 08/01/2017 Objective Swallowing Evaluation: Type of Study: MBS-Modified Barium Swallow Study  Patient Details Name: AUBRIE LUCIEN MRN: [id number] Date of Birth: Jan 10, 1946 Today's Date: 08/01/2017 Time: SLP Start Time (ACUTE ONLY): 1345 -SLP Stop Time (ACUTE ONLY): 1359 SLP Time Calculation (min) (ACUTE ONLY): 14 min Past Medical History: Past Medical History: Diagnosis Date . Abdominal discomfort   Chronic N/V/D. Presumptive dx Crohn's dx per elevated p ANCA. Failed Entocort and Pentasa. Sep 2003 - ileocolectomy c anastomosis per Dr Deon Pilling 2/2 adhesions - path was hegative for Crohns. EGD, Sm bowel follow through (11/03), and an eteroclysis (10/03) were unrevealing. Cuases hypomag and hypocalcemia. . Adnexal mass 8/03  s/p lap BSO (R ovarian fibroma) & lysis of adhesions . Allergy   Seasonal . Anemia   Multifactorial. Baseline HgB 10-11 ish. B12 def - 150 in 3/10. Fe Def - ferritin 35 3/10. Both are being repleted. . Breast cancer ([place]) 03/16/13  right, 5 o'clock . CAD (coronary artery disease) 1996  1996 - PTCA and angioplasty diagonal branch. 2000 - Rotoblator & angiopllasty of diagonal. 2006 - subendocardial AMI, DES to proximal LAD.Marland Kitchen Also had 90% stenosis in distal apical LAD. EF 55 with apical hypokinesis. Indefinite ASA and Plavix. . CHF (congestive heart failure) ([place])  . Chronic kidney disease   Chronic renal insuff baseline Cr 1.2 - 1.4 ish. . Chronic pain   CT 10/10 = Spinal stenosis L2 - S1. . Diabetes mellitus   Insulin dependent . Hx  of radiation therapy 06/02/13- 07/16/13  right rbeast 4500 cGy 25 sessions, right breast boost 1600 cGy in 8 sessions . Hyperlipidemia   Managed with both a statin and Welchol. Welchol stopped 2014 2/2 cost and started on fenofibrate  . Hypertension   2006 B renal arteries patent. 2003 MRA -  no RAS. 2003 pheo W/U Dr Hassell Done reportedly negative. . Hypoxia 07/23/2017 . Lupus ([place])  . Personal history of radiation therapy  . RBBB  . Stroke [facility])   Incidental finding MRI 2002 L lacunar infarct . Wears dentures   top Past Surgical History: Past Surgical History: Procedure Laterality Date . ABDOMINAL HYSTERECTOMY   . BILATERAL SALPINGOOPHORECTOMY  8/03  Lap BSO (R ovarian fibroma) and adhesion lysis . BOWEL RESECTION  2003  ileocolectomy with anastomosis 2/2 adhesions . BREAST BIOPSY   . BREAST LUMPECTOMY Right  . BREAST LUMPECTOMY WITH NEEDLE LOCALIZATION AND AXILLARY SENTINEL LYMPH NODE BX Right 04/22/2013  Procedure: BREAST LUMPECTOMY WITH NEEDLE LOCALIZATION AND AXILLARY SENTINEL LYMPH NODE BX;  Surgeon: Stark Klein, MD;  Location: [facility];  Service: General;  Laterality: Right; . CARDIAC CATHETERIZATION    2 stents . CHOLECYSTECTOMY   . HEMICOLECTOMY    R sided hemicolectomy . HERNIA REPAIR    Ventral hernia repair . PTCA  4/06 HPI: Pt is a 72 year old female who was admitted to [facility] 5/7 with hypoxia and pleuritic chest pain. She had a decline in respiratory status 5/8 with brief PEA arrest f/b cardiogenic and septic shock requiring pressors. ETT 5/8-5/15. PMH includes CAD, CHF, CKD, DM, HTN, Stage IA right invasive ductal breast carcinoma, lupus. No prior swallow evaluations documented, although review of prior documentation suggests chronic difficulty swallowing pills.  Subjective: pt alert, needs cues to follow commands Assessment / Plan / Recommendation CHL IP CLINICAL IMPRESSIONS 08/01/2017 Clinical Impression Pt has a mild-moderate oral dysphagia that appears cognitively based, otherwise with  functional pharyngeal response. She has adequate oral function with liquids but when given bites of puree she exhibits oral holding. When cued to swallow it is piecemeal, and mild lingual residue persists even after the rest of the bolus is cleared. Mod cues from SLP and liquid washes assisted with oral clearance. For today would start with a full liquid diet. SLP will f/u for tolerance and readiness to advance. SLP Visit Diagnosis Dysphagia, oral phase (R13.11) Attention and concentration deficit following -- Frontal lobe and executive function deficit following -- Impact on safety and function Mild aspiration risk   CHL IP TREATMENT RECOMMENDATION 08/01/2017 Treatment Recommendations Therapy as outlined in treatment plan below   Prognosis 08/01/2017 Prognosis for Safe Diet Advancement Good Barriers to Reach Goals Cognitive deficits Barriers/Prognosis Comment -- CHL IP DIET RECOMMENDATION 08/01/2017 SLP Diet Recommendations Thin liquid;Other (Comment) Liquid Administration via Cup;Straw Medication Administration Crushed with puree Compensations Minimize environmental distractions;Slow rate;Small sips/bites Postural Changes Seated upright at 90 degrees   CHL IP OTHER RECOMMENDATIONS 08/01/2017 Recommended Consults -- Oral Care Recommendations Oral care BID Other Recommendations --   CHL IP FOLLOW UP RECOMMENDATIONS 08/01/2017 Follow up Recommendations (No Data)   CHL IP FREQUENCY AND DURATION 08/01/2017 Speech Therapy Frequency (ACUTE ONLY) min 2x/week Treatment Duration 2 weeks      CHL IP ORAL PHASE 08/01/2017 Oral Phase Impaired Oral - Pudding Teaspoon -- Oral - Pudding Cup -- Oral - Honey Teaspoon -- Oral - Honey Cup -- Oral - Nectar Teaspoon -- Oral - Nectar Cup -- Oral - Nectar Straw -- Oral - Thin Teaspoon WFL Oral - Thin Cup WFL Oral - Thin Straw WFL Oral - Puree Holding of bolus;Piecemeal swallowing;Lingual/palatal residue Oral - Mech Soft -- Oral - Regular -- Oral - Multi-Consistency -- Oral - Pill -- Oral Phase  - Comment --  CHL IP PHARYNGEAL PHASE 08/01/2017 Pharyngeal Phase WFL Pharyngeal- Pudding Teaspoon --  Pharyngeal -- Pharyngeal- Pudding Cup -- Pharyngeal -- Pharyngeal- Honey Teaspoon -- Pharyngeal -- Pharyngeal- Honey Cup -- Pharyngeal -- Pharyngeal- Nectar Teaspoon -- Pharyngeal -- Pharyngeal- Nectar Cup -- Pharyngeal -- Pharyngeal- Nectar Straw -- Pharyngeal -- Pharyngeal- Thin Teaspoon -- Pharyngeal -- Pharyngeal- Thin Cup -- Pharyngeal -- Pharyngeal- Thin Straw -- Pharyngeal -- Pharyngeal- Puree -- Pharyngeal -- Pharyngeal- Mechanical Soft -- Pharyngeal -- Pharyngeal- Regular -- Pharyngeal -- Pharyngeal- Multi-consistency -- Pharyngeal -- Pharyngeal- Pill -- Pharyngeal -- Pharyngeal Comment --  CHL IP CERVICAL ESOPHAGEAL PHASE 08/01/2017 Cervical Esophageal Phase WFL Pudding Teaspoon -- Pudding Cup -- Honey Teaspoon -- Honey Cup -- Nectar Teaspoon -- Nectar Cup -- Nectar Straw -- Thin Teaspoon -- Thin Cup -- Thin Straw -- Puree -- Mechanical Soft -- Regular -- Multi-consistency -- Pill -- Cervical Esophageal Comment -- No flowsheet data found. [name] 08/01/2017, 2:39 PM  [name], M.A. CCC-SLP [phone]              Radiology No results found.  Procedures Procedures (including critical care time)  Medications Ordered in ED Medications - No data to display   Initial Impression / Assessment and Plan / ED Course  I have reviewed the triage vital signs and the nursing notes.  Pertinent labs & imaging results that were available during my care of the patient were reviewed by me and considered in my medical decision making (see chart for details).    305pm pt ayymptomaitc. She ate a full meal in the ed . Blood sugar dropped to 69, c/w hypoglycemia  I suspect pt . becomning hypoglycemic due to malnourished state given recent prolonged hospitalization.  D5  drip ordered. hospitalist service consulted to arrange for overnight stay Lab work consistent with hypogleycemia,  chronic renal insuffiicency and anemia Final Clinical Impressions(s) / ED Diagnoses  Dx #1hypoglycemia #2 anemia #3 chronic renal insufficiency Final diagnoses:  None   CRITICAL CARE Performed by: Orlie Dakin Total critical care time: 30 minutes Critical care time was exclusive of separately billable procedures and treating other patients. Critical care was necessary to treat or prevent imminent or life-threatening deterioration. Critical care was time spent personally by me on the following activities: development of treatment plan with patient and/or surrogate as well as nursing, discussions with consultants, evaluation of patient's response to treatment, examination of patient, obtaining history from patient or surrogate, ordering and performing treatments and interventions, ordering and review of laboratory studies, ordering and review of radiographic studies, pulse oximetry and re-evaluation of patient's condition. ED Discharge Orders    None       Orlie Dakin, MD 08/25/17 [phone]

## 2017-08-25 NOTE — H&P (Signed)
History and Physical    [name]:[id number] DOB: 10/21/1945 DOA: 08/25/2017  PCP: Bartholomew Crews, MD Patient coming from: home  Chief Complaint: hypoglycemia  HPI: [name] is a very pleasant 72 y.o. female with medical history significant for CAD maintained on aspirin and Plavix, diabetes, chronic systolic heart failure, chronic kidney disease stage III, recent hospitalization or hypoxic respiratory failure secondary to multilobar community-acquired pneumonia plicated by acute respiratory distress syndrome with cardiogenic shock, pulseless electrical activity arrest s/p bronchoscopy present emergency department with the chief complaint of hypoglycemia. [facility] are asked to admit. Of note patient and family expressed desire to no longer be seen by teaching service.   Information is obtained from the patient and the chart and her family is at the bedside. Patient was discharged 3 days ago from inpatient rehabilitation and was doing well. Patient reports she has been feeling fine but her appetite does wax and wane. He states things just don't "taste right". He denies abdominal pain nausea vomiting. In addition family reports patient lost 25 pounds during her recent hospitalization. They also report her Lantus dose was decreased at time of discharge. Patient is not eating enough.yesterday patient's family reports she had an "episode" where she was "out of it". He was taking off her close inappropriately had an unsteady gait was having difficulty getting to the bathroom. Family did not think to check her sugar during this episode but they did offer her food and juice and her sensorium improved. This morning patient reportedly fell out of bed. She was found on the floor and noted to be lethargic. EMS was called and register noted to be 54. Provided with D10 and she became more alert. She reports she took her Lantus 2 nights ago. She states she did not take Lantus last night as her  blood sugar was 87 at 11 PM. He typically takes her Lantus dose at bedtime. He denies headache dizziness syncope or near-syncope. She denies chest pain palpitation shortness of breath. She denies fever chills dysuria hematuria frequency or urgency.    ED Course: emergency department she's afebrile hemodynamically stable and not hypoxic. Her blood sugar was 192. Was also given food of which she took a few bites. Subsequent blood sugar readings were 144, 136, 128. Is provided with D5W IV fluids.  Review of Systems: As per HPI otherwise all other systems reviewed and are negative.   Ambulatory Status: relates independently with steady gait. InDependent with ADLs. Lives at home with her family  Past Medical History:  Diagnosis Date  . Abdominal discomfort    Chronic N/V/D. Presumptive dx Crohn's dx per elevated p ANCA. Failed Entocort and Pentasa. Sep 2003 - ileocolectomy c anastomosis per Dr Deon Pilling 2/2 adhesions - path was hegative for Crohns. EGD, Sm bowel follow through (11/03), and an eteroclysis (10/03) were unrevealing. Cuases hypomag and hypocalcemia.  . Adnexal mass 8/03   s/p lap BSO (R ovarian fibroma) & lysis of adhesions  . Allergy    Seasonal  . Anemia    Multifactorial. Baseline HgB 10-11 ish. B12 def - 150 in 3/10. Fe Def - ferritin 35 3/10. Both are being repleted.  . Breast cancer ([place]) 03/16/13   right, 5 o'clock  . CAD (coronary artery disease) 1996   1996 - PTCA and angioplasty diagonal branch. 2000 - Rotoblator & angiopllasty of diagonal. 2006 - subendocardial AMI, DES to proximal LAD.Marland Kitchen Also had 90% stenosis in distal apical LAD. EF 55 with apical hypokinesis. Indefinite ASA  and Plavix.  . CHF (congestive heart failure) ([place])   . Chronic kidney disease    Chronic renal insuff baseline Cr 1.2 - 1.4 ish.  . Chronic pain    CT 10/10 = Spinal stenosis L2 - S1.  . Diabetes mellitus    Insulin dependent  . Hx of radiation therapy 06/02/13- 07/16/13   right rbeast 4500 cGy 25  sessions, right breast boost 1600 cGy in 8 sessions  . Hyperlipidemia    Managed with both a statin and Welchol. Welchol stopped 2014 2/2 cost and started on fenofibrate   . Hypertension    2006 B renal arteries patent. 2003 MRA - no RAS. 2003 pheo W/U Dr Hassell Done reportedly negative.  . Hypoxia 07/23/2017  . Lupus ([place])   . Personal history of radiation therapy   . RBBB   . Stroke [facility])    Incidental finding MRI 2002 L lacunar infarct  . Wears dentures    top    Past Surgical History:  Procedure Laterality Date  . ABDOMINAL HYSTERECTOMY    . BILATERAL SALPINGOOPHORECTOMY  8/03   Lap BSO (R ovarian fibroma) and adhesion lysis  . BOWEL RESECTION  2003   ileocolectomy with anastomosis 2/2 adhesions  . BREAST BIOPSY    . BREAST LUMPECTOMY Right   . BREAST LUMPECTOMY WITH NEEDLE LOCALIZATION AND AXILLARY SENTINEL LYMPH NODE BX Right 04/22/2013   Procedure: BREAST LUMPECTOMY WITH NEEDLE LOCALIZATION AND AXILLARY SENTINEL LYMPH NODE BX;  Surgeon: Stark Klein, MD;  Location: [facility];  Service: General;  Laterality: Right;  . CARDIAC CATHETERIZATION     2 stents  . CHOLECYSTECTOMY    . HEMICOLECTOMY     R sided hemicolectomy  . HERNIA REPAIR     Ventral hernia repair  . PTCA  4/06    Social History   Socioeconomic History  . Marital status: Legally Separated    Spouse name: Not on file  . Number of children: 4  . Years of education: Not on file  . Highest education level: Not on file  Occupational History    Employer: [employer]  Social Needs  . Financial resource strain: Not on file  . Food insecurity:    Worry: Not on file    Inability: Not on file  . Transportation needs:    Medical: Not on file    Non-medical: Not on file  Tobacco Use  . Smoking status: Never Smoker  . Smokeless tobacco: Never Used  Substance and Sexual Activity  . Alcohol use: No    Alcohol/week: 0.0 oz  . Drug use: No  . Sexual activity: Not on file  Lifestyle  .  Physical activity:    Days per week: Not on file    Minutes per session: Not on file  . Stress: Not on file  Relationships  . Social connections:    Talks on phone: Not on file    Gets together: Not on file    Attends religious service: Not on file    Active member of club or organization: Not on file    Attends meetings of clubs or organizations: Not on file    Relationship status: Not on file  . Intimate partner violence:    Fear of current or ex partner: Not on file    Emotionally abused: Not on file    Physically abused: Not on file    Forced sexual activity: Not on file  Other Topics Concern  . Not on file  Social History Narrative  . Not on file    Allergies  Allergen Reactions  . Percocet [Oxycodone-Acetaminophen] Other (See Comments)    headaches  . Diazepam Other (See Comments)    REACTION: Agitation  . Haloperidol Lactate Nausea And Vomiting  . Lorazepam Nausea Only  . Morphine Sulfate Other (See Comments)    REACTION: Agitation  . Propoxyphene Hcl Nausea And Vomiting  . Tramadol Hcl Nausea And Vomiting    Family History  Problem Relation Age of Onset  . Pancreatic cancer Brother 72  . Cancer Mother   . Breast cancer Mother 40  . Lung cancer Maternal Aunt   . Breast cancer Maternal Aunt   . Prostate cancer Maternal Uncle   . Heart attack Maternal Grandmother   . Breast cancer Maternal Grandmother   . Kidney failure Maternal Aunt   . Hypertension Daughter     Prior to Admission medications   Medication Sig Start Date End Date Taking? Authorizing Provider  acetaminophen (TYLENOL) 325 MG tablet Take 650 mg by mouth every 6 (six) hours as needed for mild pain.   Yes [provider]  amLODipine (NORVASC) 10 MG tablet Take 1 tablet (10 mg total) by mouth daily. 08/22/17  Yes Angiulli, Lavon Paganini, PA-C  anastrozole (ARIMIDEX) 1 MG tablet Take 1 tablet (1 mg total) by mouth daily. 12/19/16  Yes Truitt Merle, MD  aspirin EC 81 MG tablet Take 81 mg by mouth  daily.   Yes [provider]  atorvastatin (LIPITOR) 40 MG tablet Take 1 tablet (40 mg total) by mouth at bedtime. 08/22/17  Yes Angiulli, Lavon Paganini, PA-C  carvedilol (COREG) 25 MG tablet Take 1 tablet (25 mg total) by mouth 2 (two) times daily with a meal. 08/22/17  Yes Angiulli, Lavon Paganini, PA-C  cyanocobalamin (,VITAMIN B-12,) 1000 MCG/ML injection Inject 1 mL (1,000 mcg total) into the muscle every 30 (thirty) days. 09/12/16  Yes Bartholomew Crews, MD  desloratadine (CLARINEX) 5 MG tablet Take 5 mg by mouth at bedtime as needed (allergies). Reported on 04/11/2015 04/10/13  Yes [provider]  famotidine (PEPCID) 20 MG tablet Take 1 tablet (20 mg total) by mouth daily. 08/22/17  Yes Angiulli, Lavon Paganini, PA-C  fenofibrate 54 MG tablet Take 54 mg by mouth daily.  09/21/15  Yes [provider]  ferrous fumarate (HEMOCYTE - 106 MG FE) 325 (106 Fe) MG TABS tablet Take 1 tablet (106 mg of iron total) by mouth daily. 08/22/17  Yes Angiulli, Lavon Paganini, PA-C  furosemide (LASIX) 40 MG tablet Take 1 tablet (40 mg total) by mouth daily. 08/22/17  Yes Angiulli, Lavon Paganini, PA-C  hydrALAZINE (APRESOLINE) 10 MG tablet Take 1 tablet (10 mg total) by mouth every 8 (eight) hours. 08/22/17  Yes Angiulli, Lavon Paganini, PA-C  loperamide (IMODIUM) 2 MG capsule TAKE ONE CAPSULE BY MOUTH ONCE DAILY AS NEEDED FOR DIARRHEA OR LOOSE STOOLS 05/24/14  Yes Bartholomew Crews, MD  losartan (COZAAR) 100 MG tablet Take 1 tablet (100 mg total) by mouth daily. 08/22/17  Yes Angiulli, Lavon Paganini, PA-C  Melatonin 3 MG TABS Take 0.5 tablets (1.5 mg total) by mouth at bedtime. 08/22/17  Yes Angiulli, Lavon Paganini, PA-C  nitroGLYCERIN (NITROSTAT) 0.4 MG SL tablet Place 1 tablet (0.4 mg total) under the tongue every 5 (five) minutes as needed for chest pain. 12/06/16  Yes Josue Hector, MD  pantoprazole (PROTONIX) 40 MG tablet Take 1 tablet (40 mg total) by mouth daily. 08/22/17  Yes Leola,  Lavon Paganini, PA-C  potassium chloride SA  (K-DUR,KLOR-CON) 20 MEQ tablet Take 1 tablet (20 mEq total) by mouth daily. 08/22/17  Yes Angiulli, Lavon Paganini, PA-C  QUEtiapine (SEROQUEL) 25 MG tablet Take 3 tablets (75 mg total) by mouth at bedtime. 08/22/17  Yes Angiulli, Lavon Paganini, PA-C  ULORIC 40 MG tablet Take 1 tablet by mouth daily. 06/20/16  Yes [provider]    Physical Exam: Vitals:   08/25/17 1535 08/25/17 1545 08/25/17 1600 08/25/17 1715  BP: (!) 145/73 134/65 125/71 (!) 142/64  Pulse: 90 87 88 87  Resp: 16 17 15 12   Temp: 98.9 F (37.2 C)   99.7 F (37.6 C)  TempSrc: Oral   Oral  SpO2: 97% 98% 96% 100%  Weight:      Height:         General:  Appears calm and comfortable, sitting in chair ambulating in room in no acute distress Eyes:  PERRL, EOMI, normal lids, iris ENT:  grossly normal hearing, lips & tongue, because membranes of her mouth are pink slightly dry Neck:  no LAD, masses or thyromegaly Cardiovascular:  RRR, no m/r/g. No LE edema.  Respiratory:  CTA bilaterally, no w/r/r. Normal respiratory effort. Abdomen:  soft, ntnd, ositive bowel sounds throughout no guarding and rebounding Skin:  no rash or induration seen on limited exam Musculoskeletal:  grossly normal tone BUE/BLE, good ROM, no bony abnormality Psychiatric:  grossly normal mood and affect, speech fluent and appropriate, AOx3 Neurologic:  CN 2-12 grossly intact, moves all extremities in coordinated fashion, sensation intact speech clear facial symmetry  Labs on Admission: I have personally reviewed following labs and imaging studies  CBC: Recent Labs  Lab 08/19/17 0453 08/21/17 0517 08/25/17 1143  WBC 5.6 5.7 7.6  NEUTROABS  --   --  5.1  HGB 7.4* 7.9* 8.9*  HCT 24.0* 25.7* 28.3*  MCV 82.2 83.4 83.0  PLT 331 306 482   Basic Metabolic Panel: Recent Labs  Lab 08/19/17 0453 08/25/17 1143  NA 140 140  K 3.4* 4.9  CL 114* 109  CO2 21* 23  GLUCOSE 80 75  BUN 23* 19  CREATININE 1.08* 1.26*  CALCIUM 8.8* 9.3   GFR: Estimated  Creatinine Clearance: 42.4 mL/min (A) (by C-G formula based on SCr of 1.26 mg/dL (H)). Liver Function Tests: No results for input(s): AST, ALT, ALKPHOS, BILITOT, PROT, ALBUMIN in the last 168 hours. No results for input(s): LIPASE, AMYLASE in the last 168 hours. No results for input(s): AMMONIA in the last 168 hours. Coagulation Profile: No results for input(s): INR, PROTIME in the last 168 hours. Cardiac Enzymes: No results for input(s): CKTOTAL, CKMB, CKMBINDEX, TROPONINI in the last 168 hours. BNP (last 3 results) No results for input(s): PROBNP in the last 8760 hours. HbA1C: No results for input(s): HGBA1C in the last 72 hours. CBG: Recent Labs  Lab 08/21/17 2135 08/22/17 0649 08/25/17 0936 08/25/17 1045 08/25/17 1505  GLUCAP 113* 84 128* 125* 69   Lipid Profile: No results for input(s): CHOL, HDL, LDLCALC, TRIG, CHOLHDL, LDLDIRECT in the last 72 hours. Thyroid Function Tests: No results for input(s): TSH, T4TOTAL, FREET4, T3FREE, THYROIDAB in the last 72 hours. Anemia Panel: No results for input(s): VITAMINB12, FOLATE, FERRITIN, TIBC, IRON, RETICCTPCT in the last 72 hours. Urine analysis:    Component Value Date/Time   COLORURINE YELLOW 08/25/2017 [place] 08/25/2017 1244   LABSPEC 1.008 08/25/2017 1244   LABSPEC 1.020 03/03/2014 1341   PHURINE 5.0 08/25/2017  [place] 08/25/2017 1244   GLUCOSEU Negative 03/03/2014 1341   HGBUR NEGATIVE 08/25/2017 [place] 08/25/2017 1244   BILIRUBINUR Negative 03/03/2014 1341   KETONESUR NEGATIVE 08/25/2017 1244   PROTEINUR NEGATIVE 08/25/2017 1244   UROBILINOGEN 0.2 03/03/2014 1341   NITRITE NEGATIVE 08/25/2017 1244   LEUKOCYTESUR MODERATE (A) 08/25/2017 1244   LEUKOCYTESUR Negative 03/03/2014 1341    Creatinine Clearance: Estimated Creatinine Clearance: 42.4 mL/min (A) (by C-G formula based on SCr of 1.26 mg/dL (H)).  Sepsis  Labs: @LABRCNTIP (procalcitonin:4,lacticidven:4) )No results found for this or any previous visit (from the past 240 hour(s)).   Radiological Exams on Admission: No results found.  EKG: Independently reviewed.Normal sinus rhythm Right bundle branch block Abnormal ECG  Assessment/Plan Principal Problem:   Hypoglycemia Active Problems:   Diabetes mellitus type 2 in obese (HCC)   Acute kidney failure (HCC)   CAD (coronary artery disease)   Anemia in chronic kidney disease   Chronic kidney disease, stage III (moderate) (HCC)   Essential hypertension   1. Hypoglycemia. Etiology uncertain but likely multifactorial. Specifically Lantus dose only minimally reduced at discharge patient's appetite remains unreliable and she has had a 25 pound weight loss due to her recent hospitalization. Impression about compromise renal function as well. He is provided amp of D10 by EMS and D50 in the emergency department and blood sugar continues to trend downward. -Admit -continue D5W drip -CBG every 4 hours -regular diet -encourage eating -diabetes coordinator consult  #2. Acute kidney injury in the setting of chronic kidney disease stage III. Mild. Likely related to decreased oral intake. -gentle IV fluids -Hold nephrotoxins -monitor urine output -Recheck  #3. Hypertension.air control in the emergency department. Home medications include amlodipine, Coreg,Lasix, Apresoline,losartan -Hold Lasix until tomorrow -Resume other antihypertensive meds -monitor  #4. Anemia. Anemia of chronic disease. Hemoglobin 8.9. This appears to be close to baseline.  No signs symptoms active bleeding -Monitor  DVT prophylaxis:  lovenox Code Status: full  Family Communication: daughter and grandaughter  Disposition Plan: home  Consults called: diabetes coordinator  Admission status: obs    Dyanne Carrel M MD [facility]  If 7PM-7AM, please contact night-coverage [email or web address] Password  [email or web address]  08/25/2017, 5:16 PM

## 2017-08-25 NOTE — ED Triage Notes (Signed)
Onset today patient fell hurt right arm and right leg. EMS called blood sugar was low 54 EMS administered D10 and did not transport to the hospital. Patient alert answering and following commands appropriate. EMS rechecked blood sugar 192. Family gave food to patient and checked blood sugar multiple times 144 140 136. Concerned continues to drop even though she is eating. Last insulin was 3 days ago. Upon arrival to ED CBG 128. Alert answering and following commands appropriate.

## 2017-08-26 ENCOUNTER — Telehealth: Payer: Self-pay | Admitting: Registered Nurse

## 2017-08-26 ENCOUNTER — Other Ambulatory Visit: Payer: Self-pay

## 2017-08-26 DIAGNOSIS — E11649 Type 2 diabetes mellitus with hypoglycemia without coma: Secondary | ICD-10-CM | POA: Diagnosis not present

## 2017-08-26 DIAGNOSIS — I10 Essential (primary) hypertension: Secondary | ICD-10-CM

## 2017-08-26 DIAGNOSIS — N184 Chronic kidney disease, stage 4 (severe): Secondary | ICD-10-CM

## 2017-08-26 DIAGNOSIS — E162 Hypoglycemia, unspecified: Secondary | ICD-10-CM | POA: Diagnosis not present

## 2017-08-26 DIAGNOSIS — D631 Anemia in chronic kidney disease: Secondary | ICD-10-CM | POA: Diagnosis not present

## 2017-08-26 LAB — CBC
HEMATOCRIT: 27.8 % — AB (ref 36.0–46.0)
HEMOGLOBIN: 8.5 g/dL — AB (ref 12.0–15.0)
MCH: 25.2 pg — ABNORMAL LOW (ref 26.0–34.0)
MCHC: 30.6 g/dL (ref 30.0–36.0)
MCV: 82.5 fL (ref 78.0–100.0)
Platelets: 266 10*3/uL (ref 150–400)
RBC: 3.37 MIL/uL — AB (ref 3.87–5.11)
RDW: 19.3 % — ABNORMAL HIGH (ref 11.5–15.5)
WBC: 6.6 10*3/uL (ref 4.0–10.5)

## 2017-08-26 LAB — GLUCOSE, CAPILLARY
Glucose-Capillary: 113 mg/dL — ABNORMAL HIGH (ref 65–99)
Glucose-Capillary: 126 mg/dL — ABNORMAL HIGH (ref 65–99)
Glucose-Capillary: 201 mg/dL — ABNORMAL HIGH (ref 65–99)
Glucose-Capillary: 89 mg/dL (ref 65–99)

## 2017-08-26 LAB — BASIC METABOLIC PANEL
ANION GAP: 9 (ref 5–15)
BUN: 19 mg/dL (ref 6–20)
CO2: 22 mmol/L (ref 22–32)
Calcium: 9.1 mg/dL (ref 8.9–10.3)
Chloride: 107 mmol/L (ref 101–111)
Creatinine, Ser: 1.2 mg/dL — ABNORMAL HIGH (ref 0.44–1.00)
GFR, EST AFRICAN AMERICAN: 52 mL/min — AB (ref >60)
GFR, EST NON AFRICAN AMERICAN: 45 mL/min — AB (ref >60)
Glucose, Bld: 125 mg/dL — ABNORMAL HIGH (ref 65–99)
POTASSIUM: 4.4 mmol/L (ref 3.5–5.1)
SODIUM: 138 mmol/L (ref 135–145)

## 2017-08-26 MED ORDER — LOPERAMIDE HCL 2 MG PO CAPS
2.0000 mg | ORAL_CAPSULE | ORAL | Status: DC | PRN
  Administered 2017-08-26: 2 mg via ORAL
  Filled 2017-08-26: qty 1

## 2017-08-26 MED ORDER — POTASSIUM CHLORIDE CRYS ER 20 MEQ PO TBCR: 20.0000 meq | EXTENDED_RELEASE_TABLET | Freq: Every day | ORAL | 1 refills | Status: DC | PRN

## 2017-08-26 MED ORDER — FUROSEMIDE 40 MG PO TABS: 40.0000 mg | ORAL_TABLET | Freq: Every day | ORAL | Status: DC | PRN

## 2017-08-26 NOTE — Evaluation (Signed)
Physical Therapy Evaluation Patient Details Name: [name] MRN: [id number] DOB: 29-Mar-1945 Today's Date: 08/26/2017   History of Present Illness  Pt is a 72 y/o female with PMH significant for CAD, DM, CDK, SHF admitted with hypoglycemia and AMS  Clinical Impression  Pt performing all mobility with supervision or better.  Strength in BLEs WFL.  At baseline level of functioning, no further skilled PT services. Continue with home health PT services at d/c.      Follow Up Recommendations Supervision/Assistance - 24 hour;Home health PT    Equipment Recommendations  None recommended by PT    Recommendations for Other Services       Precautions / Restrictions Precautions Precautions: Fall Restrictions Weight Bearing Restrictions: No      Mobility  Bed Mobility                  Transfers Overall transfer level: Needs assistance Equipment used: Rolling walker (2 wheeled) Transfers: Sit to/from Stand Sit to Stand: Supervision         General transfer comment: no cues, supervision for safety  Ambulation/Gait Ambulation/Gait assistance: Supervision Ambulation Distance (Feet): 60 Feet Assistive device: Rolling walker (2 wheeled) Gait Pattern/deviations: WFL(Within Functional Limits)        Stairs            Wheelchair Mobility    Modified Rankin (Stroke Patients Only)       Balance Overall balance assessment: Needs assistance Sitting-balance support: Feet supported Sitting balance-Leahy Scale: Good     Standing balance support: Bilateral upper extremity supported Standing balance-Leahy Scale: Fair                               Pertinent Vitals/Pain Pain Assessment: No/denies pain    Home Living Family/patient expects to be discharged to:: Private residence Living Arrangements: Children Available Help at Discharge: Family;Available 24 hours/day Type of Home: Apartment Home Access: Level entry     Home Layout: One  level Home Equipment: Walker - 4 wheels      Prior Function Level of Independence: Needs assistance   Gait / Transfers Assistance Needed: supervision for all mobility           Hand Dominance        Extremity/Trunk Assessment        Lower Extremity Assessment Lower Extremity Assessment: Overall WFL for tasks assessed(3+/5 to 4/5 proximal to distal in BLEs)       Communication   Communication: No difficulties  Cognition Arousal/Alertness: Awake/alert Behavior During Therapy: WFL for tasks assessed/performed Overall Cognitive Status: Within Functional Limits for tasks assessed                     Current Attention Level: Alternating   Following Commands: Follows multi-step commands consistently              General Comments      Exercises     Assessment/Plan    PT Assessment Patent does not need any further PT services  PT Problem List         PT Treatment Interventions      PT Goals (Current goals can be found in the Care Plan section)       Frequency     Barriers to discharge        Co-evaluation               AM-PAC PT "6 Clicks" Daily  Activity  Outcome Measure Difficulty turning over in bed (including adjusting bedclothes, sheets and blankets)?: None Difficulty moving from lying on back to sitting on the side of the bed? : None Difficulty sitting down on and standing up from a chair with arms (e.g., wheelchair, bedside commode, etc,.)?: A Little Help needed moving to and from a bed to chair (including a wheelchair)?: None Help needed walking in hospital room?: None Help needed climbing 3-5 steps with a railing? : A Little 6 Click Score: 22    End of Session   Activity Tolerance: Patient tolerated treatment well Patient left: in chair;with call bell/phone within reach;with family/visitor present        Time: 1040-1050 PT Time Calculation (min) (ACUTE ONLY): 10 min   Charges:   PT Evaluation $PT Eval Low  Complexity: 1 Low     PT G Codes:   PT G-Codes **NOT FOR INPATIENT CLASS** Functional Assessment Tool Used: AM-PAC 6 Clicks Basic Mobility Functional Limitation: Mobility: Walking and moving around Mobility: Walking and Moving Around Current Status (B0149): At least 20 percent but less than 40 percent impaired, limited or restricted Mobility: Walking and Moving Around Goal Status [phone]): At least 20 percent but less than 40 percent impaired, limited or restricted Mobility: Walking and Moving Around Discharge Status [phone]): At least 20 percent but less than 40 percent impaired, limited or restricted      Michel Santee 08/26/2017, 11:21 AM

## 2017-08-26 NOTE — Telephone Encounter (Signed)
Upon reviewing chart, [name] was  re-admitted to hospital on 08/25/2017. Placed a call to Ms. Johannesen no answer.

## 2017-08-26 NOTE — Progress Notes (Signed)
Inpatient Diabetes Program Recommendations  AACE/ADA: New Consensus Statement on Inpatient Glycemic Control (2015)  Target Ranges:  Prepandial:   less than 140 mg/dL      Peak postprandial:   less than 180 mg/dL (1-2 hours)      Critically ill patients:  140 - 180 mg/dL   Results for [name], [name] (MRN [id number]) as of 08/26/2017 10:15  Ref. Range 08/22/2017 06:49 08/25/2017 09:36 08/25/2017 10:45 08/25/2017 15:05 08/25/2017 17:16 08/25/2017 20:02 08/26/2017 00:19 08/26/2017 04:03 08/26/2017 08:00  Glucose-Capillary Latest Ref Range: 65 - 99 mg/dL 84 128 (H) 125 (H) 69 126 (H) 115 (H) 89 113 (H) 126 (H)   Review of Glycemic Control  Current orders for Inpatient glycemic control: None  Inpatient Diabetes Program Recommendations: Correction (SSI): Please consider ordering CBGs with Novolog 0-9 units TID with meals and Novolog 0-5 units QHS. Diet: If appropriate, please consider discontinuing Regular diet and ordering Carb Modified diet.   NOTE: Noted consult for Inpatient Diabetes Coordinator for insulin recommendations. In reviewing chart, noted while patient was inpatient and at inpatient rehab, patient was ordered Lantus 15 units daily and Novolog correction scale with fairly good glycemic control. H&P on 07/23/17 notes patient was taking Levemir 36 units daily, repaglinide 73m TID, and linagliptin 554mdaily for DM control as an outpatient prior to hospitalization on 07/23/17. Patient has not receive any insulin since being admitted and glucose has ranged from 69-128 mg/dl. Would not recommend ordering basal insulin at this time. Recommend ordering CBGs and Novolog sensitive correction TID with meals and bedtime correction at HS.  Thanks, MaBarnie AldermanRN, MSN, CDE Diabetes Coordinator Inpatient Diabetes Program [phone] Pager from 8am to 5pm)

## 2017-08-26 NOTE — Care Management Note (Signed)
Case Management Note Marvetta Gibbons RN, BSN Unit 4E-Case Manager-- Cross coverage for [phone]  Patient Details  Name: [name] MRN: [id number] Date of Birth: 1945-10-07  Subjective/Objective:  Pt presented with hypoglycemia (obs status)                    Action/Plan: PTA pt lived at home with family- discharged from [facility] on 08/22/17 and had [place] referral to [facility] for HHRN/PT/OT/ST/CSW- along with telemonitoring Spoke with pt and one of her daughters at the bedside who states that [facility] services have not been started due to pt returning to the hospital. They have been in touch with [facility] however regarding start of care. Call made to [facility] with [facility] who confirmed referral and services that pt will be receiving- since pt is in observation status no resumption orders needed at this time [facility] can use current orders- notified Dorian Pod of potential d/c for this afternoon to transition back home- [facility] will f/u with pt and family upon discharge to start [facility] services in the home.   Expected Discharge Date:                  Expected Discharge Plan:  [place]  In-House Referral:  NA  Discharge planning Services  CM Consult  Post Acute Care Choice:  Home Health, Resumption of Svcs/PTA Provider Choice offered to:  Patient, Adult Children  DME Arranged:    DME Agency:     HH Arranged:  RN, Disease Management, PT, OT, Speech Therapy, Social Work [place] Agency:  [facility]  Status of Service:  Completed, signed off  If discussed at [place] of Stay Meetings, dates discussed:    Discharge Disposition:  Home/home health   Additional Comments:  Dawayne Patricia, RN 08/26/2017, 11:28 AM

## 2017-08-26 NOTE — Progress Notes (Signed)
Pt's IV infiltrated, IV tem paged and attempted to start new IV. IV RN was not able to start it from first attempt and daughter refused IV. MD notified.

## 2017-08-26 NOTE — Discharge Summary (Signed)
Physician Discharge Summary  [name] TDV:[id number] DOB: 1945-11-25 DOA: 08/25/2017  PCP: patient establishing with Dr. Delfina Redwood  Admit date: 08/25/2017 Discharge date: 08/26/2017  Admitted From: home Discharge disposition: home   Recommendations for Outpatient Follow-Up:   1. ? Benefit from remeron--  2. Home health 3. Patient to bring in log of blood sugars to PCP-- holding all blood sugars medications-- suspect with weight loss, she may be diet controlled   Discharge Diagnosis:   Principal Problem:   Hypoglycemia Active Problems:   CAD (coronary artery disease)   Anemia in chronic kidney disease   Chronic kidney disease, stage III (moderate) (HCC)   Diabetes mellitus type 2 in obese [facility])   Essential hypertension   Acute kidney failure ([place])    Discharge Condition: Improved.  Diet recommendation: Low sodium, heart healthy.  Carbohydrate-modified  Wound care: None.  Code status: Full.   History of Present Illness:   [name] is a very pleasant 72 y.o. female with medical history significant for CAD maintained on aspirin and Plavix, diabetes, chronic systolic heart failure, chronic kidney disease stage III, recent hospitalization or hypoxic respiratory failure secondary to multilobar community-acquired pneumonia plicated by acute respiratory distress syndrome with cardiogenic shock, pulseless electrical activity arrest s/p bronchoscopy present emergency department with the chief complaint of hypoglycemia. [facility] hospital[facility] are asked to admit. Of note patient and family expressed desire to no longer be seen by teaching service.   Information is obtained from the patient and the chart and her family is at the bedside. Patient was discharged 3 days ago from inpatient rehabilitation and was doing well. Patient reports she has been feeling fine but her appetite does wax and wane. He states things just don't "taste right". He denies abdominal pain nausea  vomiting. In addition family reports patient lost 25 pounds during her recent hospitalization. They also report her Lantus dose was decreased at time of discharge. Patient is not eating enough.yesterday patient's family reports she had an "episode" where she was "out of it". He was taking off her close inappropriately had an unsteady gait was having difficulty getting to the bathroom. Family did not think to check her sugar during this episode but they did offer her food and juice and her sensorium improved. This morning patient reportedly fell out of bed. She was found on the floor and noted to be lethargic. EMS was called and register noted to be 54. Provided with D10 and she became more alert. She reports she took her Lantus 2 nights ago. She states she did not take Lantus last night as her blood sugar was 87 at 11 PM. He typically takes her Lantus dose at bedtime. He denies headache dizziness syncope or near-syncope. She denies chest pain palpitation shortness of breath. She denies fever chills dysuria hematuria frequency or urgency.     Hospital Course by Problem:   Hypoglycemia.  -25 pound weight loss due to her recent hospitalization -not eating well consistently -holding all meds for now -patient to keep log of blood sugars and bring to PCP in 1 week   Acute kidney injury in the setting of chronic kidney disease stage III. Mild. Likely related to decreased oral intake. -change lasix to PRN  Hypertension -Home medications include amlodipine, Coreg,Lasix, Apresoline,losartan -change lasix to PRN  Anemia. Anemia of chronic disease. Hemoglobin 8.9. This appears to be close to baseline.  No signs symptoms active bleeding -Monitor      Medical Consultants:  Discharge Exam:   Vitals:   08/26/17 1259 08/26/17 1342  BP: (!) 106/57 (!) 129/59  Pulse: 79 85  Resp: 18   Temp: 98.1 F (36.7 C)   SpO2: 100%    Vitals:   08/26/17 0020 08/26/17 0402 08/26/17 1259 08/26/17  1342  BP: (!) 119/55 (!) 151/63 (!) 106/57 (!) 129/59  Pulse: 81 83 79 85  Resp: 16 18 18    Temp:  98.6 F (37 C) 98.1 F (36.7 C)   TempSrc:  Oral Oral   SpO2: 99% 96% 100%   Weight:      Height:        General exam: Appears calm and comfortable, poor eye contact -- says she wants to go home    The results of significant diagnostics from this hospitalization (including imaging, microbiology, ancillary and laboratory) are listed below for reference.     Procedures and Diagnostic Studies:   No results found.   Labs:   Basic Metabolic Panel: Recent Labs  Lab 08/25/17 1143 08/26/17 0338  NA 140 138  K 4.9 4.4  CL 109 107  CO2 23 22  GLUCOSE 75 125*  BUN 19 19  CREATININE 1.26* 1.20*  CALCIUM 9.3 9.1   GFR Estimated Creatinine Clearance: 43.8 mL/min (A) (by C-G formula based on SCr of 1.2 mg/dL (H)). Liver Function Tests: No results for input(s): AST, ALT, ALKPHOS, BILITOT, PROT, ALBUMIN in the last 168 hours. No results for input(s): LIPASE, AMYLASE in the last 168 hours. No results for input(s): AMMONIA in the last 168 hours. Coagulation profile No results for input(s): INR, PROTIME in the last 168 hours.  CBC: Recent Labs  Lab 08/21/17 0517 08/25/17 1143 08/26/17 0338  WBC 5.7 7.6 6.6  NEUTROABS  --  5.1  --   HGB 7.9* 8.9* 8.5*  HCT 25.7* 28.3* 27.8*  MCV 83.4 83.0 82.5  PLT 306 302 266   Cardiac Enzymes: No results for input(s): CKTOTAL, CKMB, CKMBINDEX, TROPONINI in the last 168 hours. BNP: Invalid input(s): POCBNP CBG: Recent Labs  Lab 08/25/17 2002 08/26/17 0019 08/26/17 0403 08/26/17 0800 08/26/17 1206  GLUCAP 115* 89 113* 126* 201*   D-Dimer No results for input(s): DDIMER in the last 72 hours. Hgb A1c No results for input(s): HGBA1C in the last 72 hours. Lipid Profile No results for input(s): CHOL, HDL, LDLCALC, TRIG, CHOLHDL, LDLDIRECT in the last 72 hours. Thyroid function studies No results for input(s): TSH, T4TOTAL,  T3FREE, THYROIDAB in the last 72 hours.  Invalid input(s): FREET3 Anemia work up No results for input(s): VITAMINB12, FOLATE, FERRITIN, TIBC, IRON, RETICCTPCT in the last 72 hours. Microbiology No results found for this or any previous visit (from the past 240 hour(s)).   Discharge Instructions:   Discharge Instructions    Diet - low sodium heart healthy   Complete by:  As directed    Diet Carb Modified   Complete by:  As directed    Discharge instructions   Complete by:  As directed    Keep log of blood sugars and bring to PCP-- suspect that with weight loss you may be able to control with diet Supplement meals with protein shakes (either ensure/premiere protein or boost breeze)   Increase activity slowly   Complete by:  As directed      Allergies as of 08/26/2017      Reactions   Percocet [oxycodone-acetaminophen] Other (See Comments)   headaches   Diazepam Other (See Comments)   REACTION: Agitation  Haloperidol Lactate Nausea And Vomiting   Lorazepam Nausea Only   Morphine Sulfate Other (See Comments)   REACTION: Agitation   Propoxyphene Hcl Nausea And Vomiting   Tramadol Hcl Nausea And Vomiting      Medication List    STOP taking these medications   hydrALAZINE 10 MG tablet Commonly known as:  APRESOLINE     TAKE these medications   acetaminophen 325 MG tablet Commonly known as:  TYLENOL Take 650 mg by mouth every 6 (six) hours as needed for mild pain.   amLODipine 10 MG tablet Commonly known as:  NORVASC Take 1 tablet (10 mg total) by mouth daily.   anastrozole 1 MG tablet Commonly known as:  ARIMIDEX Take 1 tablet (1 mg total) by mouth daily.   aspirin EC 81 MG tablet Take 81 mg by mouth daily.   atorvastatin 40 MG tablet Commonly known as:  LIPITOR Take 1 tablet (40 mg total) by mouth at bedtime.   carvedilol 25 MG tablet Commonly known as:  COREG Take 1 tablet (25 mg total) by mouth 2 (two) times daily with a meal.   cyanocobalamin 1000  MCG/ML injection Commonly known as:  (VITAMIN B-12) Inject 1 mL (1,000 mcg total) into the muscle every 30 (thirty) days.   desloratadine 5 MG tablet Commonly known as:  CLARINEX Take 5 mg by mouth at bedtime as needed (allergies). Reported on 04/11/2015   famotidine 20 MG tablet Commonly known as:  PEPCID Take 1 tablet (20 mg total) by mouth daily.   fenofibrate 54 MG tablet Take 54 mg by mouth daily.   ferrous fumarate 325 (106 Fe) MG Tabs tablet Commonly known as:  HEMOCYTE - 106 mg FE Take 1 tablet (106 mg of iron total) by mouth daily.   furosemide 40 MG tablet Commonly known as:  LASIX Take 1 tablet (40 mg total) by mouth daily as needed for edema. What changed:    when to take this  reasons to take this   loperamide 2 MG capsule Commonly known as:  IMODIUM TAKE ONE CAPSULE BY MOUTH ONCE DAILY AS NEEDED FOR DIARRHEA OR LOOSE STOOLS   losartan 100 MG tablet Commonly known as:  COZAAR Take 1 tablet (100 mg total) by mouth daily.   Melatonin 3 MG Tabs Take 0.5 tablets (1.5 mg total) by mouth at bedtime.   nitroGLYCERIN 0.4 MG SL tablet Commonly known as:  NITROSTAT Place 1 tablet (0.4 mg total) under the tongue every 5 (five) minutes as needed for chest pain.   pantoprazole 40 MG tablet Commonly known as:  PROTONIX Take 1 tablet (40 mg total) by mouth daily.   potassium chloride SA 20 MEQ tablet Commonly known as:  K-DUR,KLOR-CON Take 1 tablet (20 mEq total) by mouth daily as needed (when taking lasix). What changed:    when to take this  reasons to take this   QUEtiapine 25 MG tablet Commonly known as:  SEROQUEL Take 3 tablets (75 mg total) by mouth at bedtime.   ULORIC 40 MG tablet Generic drug:  febuxostat Take 1 tablet by mouth daily.      [place], [facility] Follow up.   Specialty:  [place] Why:  [facility] services to resume as previously ordered  Contact information: [place] [address] [phone]        Seward Carol, MD Follow up.   Specialty:  Internal Medicine Why:  bring  log of blood sugars Contact information: [id number] [place] [address] [phone]            Time coordinating discharge: 35 min  Signed:  Geradine Girt  [facility] 08/26/2017, 2:06 PM

## 2017-08-28 ENCOUNTER — Telehealth: Payer: Self-pay

## 2017-08-28 DIAGNOSIS — I5022 Chronic systolic (congestive) heart failure: Secondary | ICD-10-CM | POA: Diagnosis not present

## 2017-08-28 DIAGNOSIS — E1142 Type 2 diabetes mellitus with diabetic polyneuropathy: Secondary | ICD-10-CM | POA: Diagnosis not present

## 2017-08-28 DIAGNOSIS — G8929 Other chronic pain: Secondary | ICD-10-CM | POA: Diagnosis not present

## 2017-08-28 DIAGNOSIS — I872 Venous insufficiency (chronic) (peripheral): Secondary | ICD-10-CM | POA: Diagnosis not present

## 2017-08-28 DIAGNOSIS — I2510 Atherosclerotic heart disease of native coronary artery without angina pectoris: Secondary | ICD-10-CM | POA: Diagnosis not present

## 2017-08-28 DIAGNOSIS — I130 Hypertensive heart and chronic kidney disease with heart failure and stage 1 through stage 4 chronic kidney disease, or unspecified chronic kidney disease: Secondary | ICD-10-CM | POA: Diagnosis not present

## 2017-08-28 DIAGNOSIS — D631 Anemia in chronic kidney disease: Secondary | ICD-10-CM | POA: Diagnosis not present

## 2017-08-28 DIAGNOSIS — E1122 Type 2 diabetes mellitus with diabetic chronic kidney disease: Secondary | ICD-10-CM | POA: Diagnosis not present

## 2017-08-28 DIAGNOSIS — N182 Chronic kidney disease, stage 2 (mild): Secondary | ICD-10-CM | POA: Diagnosis not present

## 2017-08-28 DIAGNOSIS — M109 Gout, unspecified: Secondary | ICD-10-CM | POA: Diagnosis not present

## 2017-08-28 DIAGNOSIS — Z8673 Personal history of transient ischemic attack (TIA), and cerebral infarction without residual deficits: Secondary | ICD-10-CM | POA: Diagnosis not present

## 2017-08-28 DIAGNOSIS — J189 Pneumonia, unspecified organism: Secondary | ICD-10-CM | POA: Diagnosis not present

## 2017-08-28 DIAGNOSIS — R1310 Dysphagia, unspecified: Secondary | ICD-10-CM | POA: Diagnosis not present

## 2017-08-28 NOTE — Telephone Encounter (Signed)
rtc to [place], lm for rtc

## 2017-08-28 NOTE — Telephone Encounter (Signed)
[name] with [facility] h[facility] requesting VO. Please call pt back.

## 2017-08-29 DIAGNOSIS — I2510 Atherosclerotic heart disease of native coronary artery without angina pectoris: Secondary | ICD-10-CM | POA: Diagnosis not present

## 2017-08-29 DIAGNOSIS — I872 Venous insufficiency (chronic) (peripheral): Secondary | ICD-10-CM | POA: Diagnosis not present

## 2017-08-29 DIAGNOSIS — R1310 Dysphagia, unspecified: Secondary | ICD-10-CM | POA: Diagnosis not present

## 2017-08-29 DIAGNOSIS — D631 Anemia in chronic kidney disease: Secondary | ICD-10-CM | POA: Diagnosis not present

## 2017-08-29 DIAGNOSIS — J189 Pneumonia, unspecified organism: Secondary | ICD-10-CM | POA: Diagnosis not present

## 2017-08-29 DIAGNOSIS — Z8673 Personal history of transient ischemic attack (TIA), and cerebral infarction without residual deficits: Secondary | ICD-10-CM | POA: Diagnosis not present

## 2017-08-29 DIAGNOSIS — N182 Chronic kidney disease, stage 2 (mild): Secondary | ICD-10-CM | POA: Diagnosis not present

## 2017-08-29 DIAGNOSIS — I130 Hypertensive heart and chronic kidney disease with heart failure and stage 1 through stage 4 chronic kidney disease, or unspecified chronic kidney disease: Secondary | ICD-10-CM | POA: Diagnosis not present

## 2017-08-29 DIAGNOSIS — E1142 Type 2 diabetes mellitus with diabetic polyneuropathy: Secondary | ICD-10-CM | POA: Diagnosis not present

## 2017-08-29 DIAGNOSIS — I5022 Chronic systolic (congestive) heart failure: Secondary | ICD-10-CM | POA: Diagnosis not present

## 2017-08-29 DIAGNOSIS — M109 Gout, unspecified: Secondary | ICD-10-CM | POA: Diagnosis not present

## 2017-08-29 DIAGNOSIS — G8929 Other chronic pain: Secondary | ICD-10-CM | POA: Diagnosis not present

## 2017-08-29 DIAGNOSIS — E1122 Type 2 diabetes mellitus with diabetic chronic kidney disease: Secondary | ICD-10-CM | POA: Diagnosis not present

## 2017-09-02 ENCOUNTER — Telehealth: Payer: Self-pay | Admitting: *Deleted

## 2017-09-02 DIAGNOSIS — M109 Gout, unspecified: Secondary | ICD-10-CM | POA: Diagnosis not present

## 2017-09-02 DIAGNOSIS — I5022 Chronic systolic (congestive) heart failure: Secondary | ICD-10-CM | POA: Diagnosis not present

## 2017-09-02 DIAGNOSIS — R1310 Dysphagia, unspecified: Secondary | ICD-10-CM | POA: Diagnosis not present

## 2017-09-02 DIAGNOSIS — J189 Pneumonia, unspecified organism: Secondary | ICD-10-CM | POA: Diagnosis not present

## 2017-09-02 DIAGNOSIS — N182 Chronic kidney disease, stage 2 (mild): Secondary | ICD-10-CM | POA: Diagnosis not present

## 2017-09-02 DIAGNOSIS — D631 Anemia in chronic kidney disease: Secondary | ICD-10-CM | POA: Diagnosis not present

## 2017-09-02 DIAGNOSIS — Z8673 Personal history of transient ischemic attack (TIA), and cerebral infarction without residual deficits: Secondary | ICD-10-CM | POA: Diagnosis not present

## 2017-09-02 DIAGNOSIS — I130 Hypertensive heart and chronic kidney disease with heart failure and stage 1 through stage 4 chronic kidney disease, or unspecified chronic kidney disease: Secondary | ICD-10-CM | POA: Diagnosis not present

## 2017-09-02 DIAGNOSIS — G8929 Other chronic pain: Secondary | ICD-10-CM | POA: Diagnosis not present

## 2017-09-02 DIAGNOSIS — I2510 Atherosclerotic heart disease of native coronary artery without angina pectoris: Secondary | ICD-10-CM | POA: Diagnosis not present

## 2017-09-02 DIAGNOSIS — I872 Venous insufficiency (chronic) (peripheral): Secondary | ICD-10-CM | POA: Diagnosis not present

## 2017-09-02 DIAGNOSIS — E1142 Type 2 diabetes mellitus with diabetic polyneuropathy: Secondary | ICD-10-CM | POA: Diagnosis not present

## 2017-09-02 DIAGNOSIS — E1122 Type 2 diabetes mellitus with diabetic chronic kidney disease: Secondary | ICD-10-CM | POA: Diagnosis not present

## 2017-09-02 NOTE — Telephone Encounter (Signed)
Estill Bamberg OT, [facility] HH called and asked for verbal orders 2week4 to address ADL's and returnt to work.  Patient's chart reviewed.  Verbal orders given per office protocol

## 2017-09-03 ENCOUNTER — Telehealth: Payer: Self-pay | Admitting: *Deleted

## 2017-09-03 ENCOUNTER — Encounter: Payer: Medicare Other | Admitting: Physical Medicine & Rehabilitation

## 2017-09-03 DIAGNOSIS — E1142 Type 2 diabetes mellitus with diabetic polyneuropathy: Secondary | ICD-10-CM | POA: Diagnosis not present

## 2017-09-03 DIAGNOSIS — E1122 Type 2 diabetes mellitus with diabetic chronic kidney disease: Secondary | ICD-10-CM | POA: Diagnosis not present

## 2017-09-03 DIAGNOSIS — I5022 Chronic systolic (congestive) heart failure: Secondary | ICD-10-CM | POA: Diagnosis not present

## 2017-09-03 DIAGNOSIS — I130 Hypertensive heart and chronic kidney disease with heart failure and stage 1 through stage 4 chronic kidney disease, or unspecified chronic kidney disease: Secondary | ICD-10-CM | POA: Diagnosis not present

## 2017-09-03 DIAGNOSIS — I2510 Atherosclerotic heart disease of native coronary artery without angina pectoris: Secondary | ICD-10-CM | POA: Diagnosis not present

## 2017-09-03 DIAGNOSIS — D631 Anemia in chronic kidney disease: Secondary | ICD-10-CM | POA: Diagnosis not present

## 2017-09-03 DIAGNOSIS — J189 Pneumonia, unspecified organism: Secondary | ICD-10-CM | POA: Diagnosis not present

## 2017-09-03 DIAGNOSIS — R1310 Dysphagia, unspecified: Secondary | ICD-10-CM | POA: Diagnosis not present

## 2017-09-03 DIAGNOSIS — G8929 Other chronic pain: Secondary | ICD-10-CM | POA: Diagnosis not present

## 2017-09-03 DIAGNOSIS — I872 Venous insufficiency (chronic) (peripheral): Secondary | ICD-10-CM | POA: Diagnosis not present

## 2017-09-03 DIAGNOSIS — Z8673 Personal history of transient ischemic attack (TIA), and cerebral infarction without residual deficits: Secondary | ICD-10-CM | POA: Diagnosis not present

## 2017-09-03 DIAGNOSIS — M109 Gout, unspecified: Secondary | ICD-10-CM | POA: Diagnosis not present

## 2017-09-03 DIAGNOSIS — N182 Chronic kidney disease, stage 2 (mild): Secondary | ICD-10-CM | POA: Diagnosis not present

## 2017-09-03 NOTE — Telephone Encounter (Signed)
Nicol[place] for [facility] HH called for [facility] pop 1wk1,2wk2,1wk6 and 2 prn visits if needed. Approval given.

## 2017-09-05 DIAGNOSIS — I872 Venous insufficiency (chronic) (peripheral): Secondary | ICD-10-CM | POA: Diagnosis not present

## 2017-09-05 DIAGNOSIS — Z8673 Personal history of transient ischemic attack (TIA), and cerebral infarction without residual deficits: Secondary | ICD-10-CM | POA: Diagnosis not present

## 2017-09-05 DIAGNOSIS — I2510 Atherosclerotic heart disease of native coronary artery without angina pectoris: Secondary | ICD-10-CM | POA: Diagnosis not present

## 2017-09-05 DIAGNOSIS — J189 Pneumonia, unspecified organism: Secondary | ICD-10-CM | POA: Diagnosis not present

## 2017-09-05 DIAGNOSIS — N182 Chronic kidney disease, stage 2 (mild): Secondary | ICD-10-CM | POA: Diagnosis not present

## 2017-09-05 DIAGNOSIS — R1310 Dysphagia, unspecified: Secondary | ICD-10-CM | POA: Diagnosis not present

## 2017-09-05 DIAGNOSIS — M109 Gout, unspecified: Secondary | ICD-10-CM | POA: Diagnosis not present

## 2017-09-05 DIAGNOSIS — E1122 Type 2 diabetes mellitus with diabetic chronic kidney disease: Secondary | ICD-10-CM | POA: Diagnosis not present

## 2017-09-05 DIAGNOSIS — I5022 Chronic systolic (congestive) heart failure: Secondary | ICD-10-CM | POA: Diagnosis not present

## 2017-09-05 DIAGNOSIS — D631 Anemia in chronic kidney disease: Secondary | ICD-10-CM | POA: Diagnosis not present

## 2017-09-05 DIAGNOSIS — G8929 Other chronic pain: Secondary | ICD-10-CM | POA: Diagnosis not present

## 2017-09-05 DIAGNOSIS — I130 Hypertensive heart and chronic kidney disease with heart failure and stage 1 through stage 4 chronic kidney disease, or unspecified chronic kidney disease: Secondary | ICD-10-CM | POA: Diagnosis not present

## 2017-09-05 DIAGNOSIS — E1142 Type 2 diabetes mellitus with diabetic polyneuropathy: Secondary | ICD-10-CM | POA: Diagnosis not present

## 2017-09-05 NOTE — Telephone Encounter (Addendum)
[facility] received [place] orders from Phys Med office. Hubbard Hartshorn, RN, BSN

## 2017-09-06 ENCOUNTER — Telehealth: Payer: Self-pay | Admitting: *Deleted

## 2017-09-06 DIAGNOSIS — E1142 Type 2 diabetes mellitus with diabetic polyneuropathy: Secondary | ICD-10-CM | POA: Diagnosis not present

## 2017-09-06 DIAGNOSIS — D631 Anemia in chronic kidney disease: Secondary | ICD-10-CM | POA: Diagnosis not present

## 2017-09-06 DIAGNOSIS — E1122 Type 2 diabetes mellitus with diabetic chronic kidney disease: Secondary | ICD-10-CM | POA: Diagnosis not present

## 2017-09-06 DIAGNOSIS — R1310 Dysphagia, unspecified: Secondary | ICD-10-CM | POA: Diagnosis not present

## 2017-09-06 DIAGNOSIS — J189 Pneumonia, unspecified organism: Secondary | ICD-10-CM | POA: Diagnosis not present

## 2017-09-06 DIAGNOSIS — N182 Chronic kidney disease, stage 2 (mild): Secondary | ICD-10-CM | POA: Diagnosis not present

## 2017-09-06 DIAGNOSIS — Z8673 Personal history of transient ischemic attack (TIA), and cerebral infarction without residual deficits: Secondary | ICD-10-CM | POA: Diagnosis not present

## 2017-09-06 DIAGNOSIS — I130 Hypertensive heart and chronic kidney disease with heart failure and stage 1 through stage 4 chronic kidney disease, or unspecified chronic kidney disease: Secondary | ICD-10-CM | POA: Diagnosis not present

## 2017-09-06 DIAGNOSIS — I5022 Chronic systolic (congestive) heart failure: Secondary | ICD-10-CM | POA: Diagnosis not present

## 2017-09-06 DIAGNOSIS — I2510 Atherosclerotic heart disease of native coronary artery without angina pectoris: Secondary | ICD-10-CM | POA: Diagnosis not present

## 2017-09-06 DIAGNOSIS — I872 Venous insufficiency (chronic) (peripheral): Secondary | ICD-10-CM | POA: Diagnosis not present

## 2017-09-06 DIAGNOSIS — G8929 Other chronic pain: Secondary | ICD-10-CM | POA: Diagnosis not present

## 2017-09-06 DIAGNOSIS — M109 Gout, unspecified: Secondary | ICD-10-CM | POA: Diagnosis not present

## 2017-09-06 NOTE — Telephone Encounter (Signed)
[name], HHPT, [facility] left a message asking for verbal orders for HHPT 2week4 for fall prevention and strengthening.  Chart reviewed. Verbal orders given per office protocol

## 2017-09-09 ENCOUNTER — Encounter: Payer: Self-pay | Admitting: Physical Medicine & Rehabilitation

## 2017-09-09 ENCOUNTER — Encounter: Payer: Medicare Other | Attending: Physical Medicine & Rehabilitation | Admitting: Physical Medicine & Rehabilitation

## 2017-09-09 VITALS — BP 147/70 | HR 77 | Ht 65.0 in | Wt 173.0 lb

## 2017-09-09 DIAGNOSIS — R490 Dysphonia: Secondary | ICD-10-CM | POA: Insufficient documentation

## 2017-09-09 DIAGNOSIS — Z841 Family history of disorders of kidney and ureter: Secondary | ICD-10-CM | POA: Diagnosis not present

## 2017-09-09 DIAGNOSIS — I252 Old myocardial infarction: Secondary | ICD-10-CM | POA: Insufficient documentation

## 2017-09-09 DIAGNOSIS — M329 Systemic lupus erythematosus, unspecified: Secondary | ICD-10-CM | POA: Insufficient documentation

## 2017-09-09 DIAGNOSIS — Z8674 Personal history of sudden cardiac arrest: Secondary | ICD-10-CM | POA: Insufficient documentation

## 2017-09-09 DIAGNOSIS — E1122 Type 2 diabetes mellitus with diabetic chronic kidney disease: Secondary | ICD-10-CM | POA: Insufficient documentation

## 2017-09-09 DIAGNOSIS — Z9049 Acquired absence of other specified parts of digestive tract: Secondary | ICD-10-CM | POA: Diagnosis not present

## 2017-09-09 DIAGNOSIS — M48061 Spinal stenosis, lumbar region without neurogenic claudication: Secondary | ICD-10-CM | POA: Diagnosis not present

## 2017-09-09 DIAGNOSIS — Z853 Personal history of malignant neoplasm of breast: Secondary | ICD-10-CM | POA: Diagnosis not present

## 2017-09-09 DIAGNOSIS — Z803 Family history of malignant neoplasm of breast: Secondary | ICD-10-CM | POA: Diagnosis not present

## 2017-09-09 DIAGNOSIS — G9340 Encephalopathy, unspecified: Secondary | ICD-10-CM | POA: Diagnosis not present

## 2017-09-09 DIAGNOSIS — K5090 Crohn's disease, unspecified, without complications: Secondary | ICD-10-CM | POA: Diagnosis not present

## 2017-09-09 DIAGNOSIS — R5381 Other malaise: Secondary | ICD-10-CM | POA: Diagnosis not present

## 2017-09-09 DIAGNOSIS — Z8249 Family history of ischemic heart disease and other diseases of the circulatory system: Secondary | ICD-10-CM | POA: Insufficient documentation

## 2017-09-09 DIAGNOSIS — G8929 Other chronic pain: Secondary | ICD-10-CM | POA: Diagnosis not present

## 2017-09-09 DIAGNOSIS — Z8709 Personal history of other diseases of the respiratory system: Secondary | ICD-10-CM | POA: Insufficient documentation

## 2017-09-09 DIAGNOSIS — I5022 Chronic systolic (congestive) heart failure: Secondary | ICD-10-CM | POA: Diagnosis not present

## 2017-09-09 DIAGNOSIS — M545 Low back pain, unspecified: Secondary | ICD-10-CM | POA: Insufficient documentation

## 2017-09-09 DIAGNOSIS — Z90710 Acquired absence of both cervix and uterus: Secondary | ICD-10-CM | POA: Insufficient documentation

## 2017-09-09 DIAGNOSIS — Z8673 Personal history of transient ischemic attack (TIA), and cerebral infarction without residual deficits: Secondary | ICD-10-CM | POA: Insufficient documentation

## 2017-09-09 DIAGNOSIS — Z955 Presence of coronary angioplasty implant and graft: Secondary | ICD-10-CM | POA: Diagnosis not present

## 2017-09-09 DIAGNOSIS — I2510 Atherosclerotic heart disease of native coronary artery without angina pectoris: Secondary | ICD-10-CM | POA: Diagnosis not present

## 2017-09-09 DIAGNOSIS — E1142 Type 2 diabetes mellitus with diabetic polyneuropathy: Secondary | ICD-10-CM

## 2017-09-09 DIAGNOSIS — Z9889 Other specified postprocedural states: Secondary | ICD-10-CM | POA: Diagnosis not present

## 2017-09-09 DIAGNOSIS — Z801 Family history of malignant neoplasm of trachea, bronchus and lung: Secondary | ICD-10-CM | POA: Insufficient documentation

## 2017-09-09 DIAGNOSIS — E11649 Type 2 diabetes mellitus with hypoglycemia without coma: Secondary | ICD-10-CM | POA: Diagnosis not present

## 2017-09-09 DIAGNOSIS — E785 Hyperlipidemia, unspecified: Secondary | ICD-10-CM | POA: Insufficient documentation

## 2017-09-09 DIAGNOSIS — I130 Hypertensive heart and chronic kidney disease with heart failure and stage 1 through stage 4 chronic kidney disease, or unspecified chronic kidney disease: Secondary | ICD-10-CM | POA: Diagnosis not present

## 2017-09-09 DIAGNOSIS — Z794 Long term (current) use of insulin: Secondary | ICD-10-CM | POA: Insufficient documentation

## 2017-09-09 DIAGNOSIS — I4510 Unspecified right bundle-branch block: Secondary | ICD-10-CM | POA: Insufficient documentation

## 2017-09-09 DIAGNOSIS — Z8042 Family history of malignant neoplasm of prostate: Secondary | ICD-10-CM | POA: Insufficient documentation

## 2017-09-09 DIAGNOSIS — Z800 Family history of malignant neoplasm of digestive organs: Secondary | ICD-10-CM | POA: Diagnosis not present

## 2017-09-09 NOTE — Patient Instructions (Signed)
RETURN TO DRIVING PLAN:  WITH THE SUPERVISION OF A LICENSED DRIVER, PLEASE DRIVE IN AN EMPTY PARKING LOT FOR AT LEAST 2-3 TRIALS TO TEST REACTION TIME, VISION, USE OF EQUIPMENT IN CAR, ETC.  IF SUCCESSFUL WITH THE PARKING LOT DRIVING, PROCEED TO SUPERVISED DRIVING TRIALS IN YOUR NEIGHBORHOOD STREETS AT LOW TRAFFIC TIMES TO TEST OBSERVATION TO TRAFFIC SIGNALS, REACTION TIME, ETC. PLEASE ATTEMPT AT LEAST 2-3 TRIALS IN YOUR NEIGHBORHOOD.  IF NEIGHBORHOOD DRIVING IS SUCCESSFUL, YOU MAY PROCEED TO DRIVING IN BUSIER AREAS IN YOUR COMMUNITY WITH SUPERVISION OF A LICENSED DRIVER. PLEASE ATTEMPT AT LEAST 4-5 TRIALS.  IF COMMUNITY DRIVING IS SUCCESSFUL, YOU MAY PROCEED TO DRIVING ALONE, DURING THE DAY TIME, IN NON-PEAK TRAFFIC TIMES. YOU SHOULD DRIVE NO FURTHER THAN 15 MINUTES IN ONE DIRECTION. PLEASE DO NOT DRIVE IF YOU FEEL FATIGUED OR UNDER THE INFLUENCE OF MEDICATION.    ASK THERAPY ABOUT WORKING ON YOUR LOW BACK.

## 2017-09-09 NOTE — Progress Notes (Signed)
Subjective:    Patient ID: [name], female    DOB: June 15, 1945, 72 y.o.   MRN: [id number]  HPI  [name] is here in follow up of her inpatient rehab stay debility and encephalopathy. She was readmitted with hypoglycemia briefly after her discharge was sent home in a few days and has done well since then.  She complains of low back pain which bothers her more when she is up and walking. It doesn't refer to her legs. Therapy isn't addressing her low back per se. They are working on strength and balance.  She has noticed that her voice remains hoarse still.  She denies any problems with swallowing liquids or solids.  She is not taking anything or applied any modalities to her back to treat the pain.  Pain seems to be more along the right side of her back.  She states that her sugars have been under better control especially when she eats the appropriate foods.  She is off all insulin currently.  She denies any shortness of breath or chest pain.  Her bowels and bladder seem to be working normally.  She is not keeping an eye on her weights at home.  Appetite has been good.   Pain Inventory Average Pain 4 Pain Right Now 5 My pain is dull  In the last 24 hours, has pain interfered with the following? General activity 4 Relation with others 6 Enjoyment of life 4 What TIME of day is your pain at its worst? na Sleep (in general) Fair  Pain is worse with: walking Pain improves with: na Relief from Meds: na  Mobility walk without assistance ability to climb steps?  no do you drive?  no  Function employed # of hrs/week 8  Neuro/Psych weakness  Prior Studies Any changes since last visit?  no  Physicians involved in your care Any changes since last visit?  no   Family History  Problem Relation Age of Onset  . Pancreatic cancer Brother 74  . Cancer Mother   . Breast cancer Mother 76  . Lung cancer Maternal Aunt   . Breast cancer Maternal Aunt   . Prostate cancer  Maternal Uncle   . Heart attack Maternal Grandmother   . Breast cancer Maternal Grandmother   . Kidney failure Maternal Aunt   . Hypertension Daughter    Social History   Socioeconomic History  . Marital status: Legally Separated    Spouse name: Not on file  . Number of children: 4  . Years of education: Not on file  . Highest education level: Not on file  Occupational History    Employer: [employer]  Social Needs  . Financial resource strain: Not on file  . Food insecurity:    Worry: Not on file    Inability: Not on file  . Transportation needs:    Medical: Not on file    Non-medical: Not on file  Tobacco Use  . Smoking status: Never Smoker  . Smokeless tobacco: Never Used  Substance and Sexual Activity  . Alcohol use: No    Alcohol/week: 0.0 oz  . Drug use: No  . Sexual activity: Not on file  Lifestyle  . Physical activity:    Days per week: Not on file    Minutes per session: Not on file  . Stress: Not on file  Relationships  . Social connections:    Talks on phone: Not on file    Gets together: Not on file  Attends religious service: Not on file    Active member of club or organization: Not on file    Attends meetings of clubs or organizations: Not on file    Relationship status: Not on file  Other Topics Concern  . Not on file  Social History Narrative  . Not on file   Past Surgical History:  Procedure Laterality Date  . ABDOMINAL HYSTERECTOMY    . BILATERAL SALPINGOOPHORECTOMY  8/03   Lap BSO (R ovarian fibroma) and adhesion lysis  . BOWEL RESECTION  2003   ileocolectomy with anastomosis 2/2 adhesions  . BREAST BIOPSY    . BREAST LUMPECTOMY Right   . BREAST LUMPECTOMY WITH NEEDLE LOCALIZATION AND AXILLARY SENTINEL LYMPH NODE BX Right 04/22/2013   Procedure: BREAST LUMPECTOMY WITH NEEDLE LOCALIZATION AND AXILLARY SENTINEL LYMPH NODE BX;  Surgeon: Stark Klein, MD;  Location: [place];  Service: General;  Laterality: Right;    . CARDIAC CATHETERIZATION     2 stents  . CHOLECYSTECTOMY    . HEMICOLECTOMY     R sided hemicolectomy  . HERNIA REPAIR     Ventral hernia repair  . PTCA  4/06   Past Medical History:  Diagnosis Date  . Abdominal discomfort    Chronic N/V/D. Presumptive dx Crohn's dx per elevated p ANCA. Failed Entocort and Pentasa. Sep 2003 - ileocolectomy c anastomosis per Dr Deon Pilling 2/2 adhesions - path was hegative for Crohns. EGD, Sm bowel follow through (11/03), and an eteroclysis (10/03) were unrevealing. Cuases hypomag and hypocalcemia.  . Adnexal mass 8/03   s/p lap BSO (R ovarian fibroma) & lysis of adhesions  . Allergy    Seasonal  . Anemia    Multifactorial. Baseline HgB 10-11 ish. B12 def - 150 in 3/10. Fe Def - ferritin 35 3/10. Both are being repleted.  . Breast cancer ([place]) 03/16/13   right, 5 o'clock  . CAD (coronary artery disease) 1996   1996 - PTCA and angioplasty diagonal branch. 2000 - Rotoblator & angiopllasty of diagonal. 2006 - subendocardial AMI, DES to proximal LAD.Marland Kitchen Also had 90% stenosis in distal apical LAD. EF 55 with apical hypokinesis. Indefinite ASA and Plavix.  . CHF (congestive heart failure) ([place])   . Chronic kidney disease    Chronic renal insuff baseline Cr 1.2 - 1.4 ish.  . Chronic pain    CT 10/10 = Spinal stenosis L2 - S1.  . Diabetes mellitus    Insulin dependent  . Hx of radiation therapy 06/02/13- 07/16/13   right rbeast 4500 cGy 25 sessions, right breast boost 1600 cGy in 8 sessions  . Hyperlipidemia    Managed with both a statin and Welchol. Welchol stopped 2014 2/2 cost and started on fenofibrate   . Hypertension    2006 B renal arteries patent. 2003 MRA - no RAS. 2003 pheo W/U Dr Hassell Done reportedly negative.  . Hypoxia 07/23/2017  . Lupus ([place])   . Personal history of radiation therapy   . RBBB   . Stroke [facility])    Incidental finding MRI 2002 L lacunar infarct  . Wears dentures    top   Ht 5' 5"  (1.651 m)   Wt 173 lb (78.5 kg)   BMI 28.79 kg/m    Opioid Risk Score:   Fall Risk Score:  `1  Depression screen PHQ 2/9  Depression screen [facility] 2/9 07/23/2017 04/18/2017 12/20/2016 10/16/2016 10/11/2016 06/11/2016 05/24/2016  Decreased Interest 0 0 0 0 0 0 0  Down, Depressed, Hopeless 0 0  0 0 0 0 0  PHQ - 2 Score 0 0 0 0 0 0 0  Some recent data might be hidden     Review of Systems  Constitutional: Negative.   HENT: Negative.   Eyes: Negative.   Respiratory: Negative.   Cardiovascular: Negative.   Gastrointestinal: Negative.   Endocrine: Negative.   Genitourinary: Negative.   Musculoskeletal: Positive for arthralgias, back pain and myalgias.  Skin: Negative.   Allergic/Immunologic: Negative.   Neurological: Negative.   Hematological: Negative.   Psychiatric/Behavioral: Negative.        Objective:   Physical Exam General: No acute distress HEENT: EOMI, oral membranes moist.  Voice is dysphonic Cards: reg rate  Chest: normal effort Abdomen: Soft, NT, ND Skin: dry, intact Extremities: no edema Musc: Has mild tenderness to palpation over the right lower lumbar paraspinals and lumbar spine.  Some tenderness at the PSIS area also.  Straight leg testing and seated slump testing were negative for pain.  She did seem to have pain with range of motion in all planes essentially today. Neurological:  Alert.  Transfers easily.  Tends to drop into the right leg with weightbearing on that side during gait. Motor: Strength is 5 out of 5 in all 4 limbs although she does favor the right leg during weightbearing as I noted above Psychiatric: cooperative         Assessment & Plan:  Medical Problem List and Plan:  1. Debility and encephalopathy secondary to ARDS/cardiogenic shock/PEA arrest /CAP/multi-medical-extubated 07/31/2017   -She has made nice gains but still struggling with some weight shift which may be pain related as it pertains to her back.   -She will continue with home health therapy for now but consider transition to  outpatient setting. 2.  Pain Management: low back pain may be multifactorial.  She has history of some sort of lumbar surgery in the lower lumbar spine.  Her paraspinals are tight especially on the right side  -Patient was provided a basic exercise packet for low back stretches and posture.  -Additionally I sent her for x-rays of her lumbar spine to assess her surgical site in the areas adjacent 4. Mood/delirium/insomnia: Resolved/off all meds              5.  Dysphonia: Continue to monitor.  She has persistent symptoms after the next month we will make an ENT referral 6. Chronic systolic congestive heart failure.              -Encourage monitoring of weights at home 7. CAD/CKD/DM per primary    15 minutes were spent in direct patient contact today.  I or my nurse practitioner will see her back in about 6 weeks time to discuss issues above

## 2017-09-10 ENCOUNTER — Telehealth: Payer: Self-pay

## 2017-09-10 ENCOUNTER — Ambulatory Visit
Admission: RE | Admit: 2017-09-10 | Discharge: 2017-09-10 | Disposition: A | Discharge status: Home / Self Care | Payer: Medicare Other | Source: Ambulatory Visit | Attending: Physical Medicine & Rehabilitation | Admitting: Physical Medicine & Rehabilitation

## 2017-09-10 DIAGNOSIS — I872 Venous insufficiency (chronic) (peripheral): Secondary | ICD-10-CM | POA: Diagnosis not present

## 2017-09-10 DIAGNOSIS — N182 Chronic kidney disease, stage 2 (mild): Secondary | ICD-10-CM | POA: Diagnosis not present

## 2017-09-10 DIAGNOSIS — M545 Low back pain, unspecified: Secondary | ICD-10-CM

## 2017-09-10 DIAGNOSIS — S3992XA Unspecified injury of lower back, initial encounter: Secondary | ICD-10-CM | POA: Diagnosis not present

## 2017-09-10 DIAGNOSIS — E1122 Type 2 diabetes mellitus with diabetic chronic kidney disease: Secondary | ICD-10-CM | POA: Diagnosis not present

## 2017-09-10 DIAGNOSIS — M109 Gout, unspecified: Secondary | ICD-10-CM | POA: Diagnosis not present

## 2017-09-10 DIAGNOSIS — I5022 Chronic systolic (congestive) heart failure: Secondary | ICD-10-CM | POA: Diagnosis not present

## 2017-09-10 DIAGNOSIS — I2510 Atherosclerotic heart disease of native coronary artery without angina pectoris: Secondary | ICD-10-CM | POA: Diagnosis not present

## 2017-09-10 DIAGNOSIS — G8929 Other chronic pain: Secondary | ICD-10-CM | POA: Diagnosis not present

## 2017-09-10 DIAGNOSIS — E1142 Type 2 diabetes mellitus with diabetic polyneuropathy: Secondary | ICD-10-CM | POA: Diagnosis not present

## 2017-09-10 DIAGNOSIS — D631 Anemia in chronic kidney disease: Secondary | ICD-10-CM | POA: Diagnosis not present

## 2017-09-10 DIAGNOSIS — R1310 Dysphagia, unspecified: Secondary | ICD-10-CM | POA: Diagnosis not present

## 2017-09-10 DIAGNOSIS — I130 Hypertensive heart and chronic kidney disease with heart failure and stage 1 through stage 4 chronic kidney disease, or unspecified chronic kidney disease: Secondary | ICD-10-CM | POA: Diagnosis not present

## 2017-09-10 DIAGNOSIS — Z8673 Personal history of transient ischemic attack (TIA), and cerebral infarction without residual deficits: Secondary | ICD-10-CM | POA: Diagnosis not present

## 2017-09-10 DIAGNOSIS — J189 Pneumonia, unspecified organism: Secondary | ICD-10-CM | POA: Diagnosis not present

## 2017-09-10 NOTE — Telephone Encounter (Signed)
OK to hold anastrozole for now, I would like to see her back in about a month, please schedule if she agrees.   Truitt Merle MD

## 2017-09-10 NOTE — Telephone Encounter (Signed)
[name] calls stating she was just discharged from [facility] after a month stay, they had discontinued her anastrozole.  Want to know if they should restart this medication (she is 5 years out)<  [phone]

## 2017-09-10 NOTE — Telephone Encounter (Signed)
Spoke with patient's daughter per Dr. Burr Medico okay to hold anastrozole would like to see her in a month.  I will send scheduling message.  Wednesday afternoons are the best, family is trying to coordinate her care.

## 2017-09-11 ENCOUNTER — Telehealth: Payer: Self-pay | Admitting: Hematology

## 2017-09-11 DIAGNOSIS — I130 Hypertensive heart and chronic kidney disease with heart failure and stage 1 through stage 4 chronic kidney disease, or unspecified chronic kidney disease: Secondary | ICD-10-CM | POA: Diagnosis not present

## 2017-09-11 DIAGNOSIS — J189 Pneumonia, unspecified organism: Secondary | ICD-10-CM | POA: Diagnosis not present

## 2017-09-11 DIAGNOSIS — M109 Gout, unspecified: Secondary | ICD-10-CM | POA: Diagnosis not present

## 2017-09-11 DIAGNOSIS — I5022 Chronic systolic (congestive) heart failure: Secondary | ICD-10-CM | POA: Diagnosis not present

## 2017-09-11 DIAGNOSIS — I2510 Atherosclerotic heart disease of native coronary artery without angina pectoris: Secondary | ICD-10-CM | POA: Diagnosis not present

## 2017-09-11 DIAGNOSIS — R1310 Dysphagia, unspecified: Secondary | ICD-10-CM | POA: Diagnosis not present

## 2017-09-11 DIAGNOSIS — D631 Anemia in chronic kidney disease: Secondary | ICD-10-CM | POA: Diagnosis not present

## 2017-09-11 DIAGNOSIS — E1122 Type 2 diabetes mellitus with diabetic chronic kidney disease: Secondary | ICD-10-CM | POA: Diagnosis not present

## 2017-09-11 DIAGNOSIS — N182 Chronic kidney disease, stage 2 (mild): Secondary | ICD-10-CM | POA: Diagnosis not present

## 2017-09-11 DIAGNOSIS — I872 Venous insufficiency (chronic) (peripheral): Secondary | ICD-10-CM | POA: Diagnosis not present

## 2017-09-11 DIAGNOSIS — E1142 Type 2 diabetes mellitus with diabetic polyneuropathy: Secondary | ICD-10-CM | POA: Diagnosis not present

## 2017-09-11 DIAGNOSIS — G8929 Other chronic pain: Secondary | ICD-10-CM | POA: Diagnosis not present

## 2017-09-11 DIAGNOSIS — Z8673 Personal history of transient ischemic attack (TIA), and cerebral infarction without residual deficits: Secondary | ICD-10-CM | POA: Diagnosis not present

## 2017-09-11 NOTE — Telephone Encounter (Signed)
Appointments scheduled daughter notified per 6/25 sch msg

## 2017-09-12 DIAGNOSIS — I2510 Atherosclerotic heart disease of native coronary artery without angina pectoris: Secondary | ICD-10-CM | POA: Diagnosis not present

## 2017-09-12 DIAGNOSIS — M109 Gout, unspecified: Secondary | ICD-10-CM | POA: Diagnosis not present

## 2017-09-12 DIAGNOSIS — Z8673 Personal history of transient ischemic attack (TIA), and cerebral infarction without residual deficits: Secondary | ICD-10-CM | POA: Diagnosis not present

## 2017-09-12 DIAGNOSIS — I5022 Chronic systolic (congestive) heart failure: Secondary | ICD-10-CM | POA: Diagnosis not present

## 2017-09-12 DIAGNOSIS — E1122 Type 2 diabetes mellitus with diabetic chronic kidney disease: Secondary | ICD-10-CM | POA: Diagnosis not present

## 2017-09-12 DIAGNOSIS — G8929 Other chronic pain: Secondary | ICD-10-CM | POA: Diagnosis not present

## 2017-09-12 DIAGNOSIS — I130 Hypertensive heart and chronic kidney disease with heart failure and stage 1 through stage 4 chronic kidney disease, or unspecified chronic kidney disease: Secondary | ICD-10-CM | POA: Diagnosis not present

## 2017-09-12 DIAGNOSIS — N182 Chronic kidney disease, stage 2 (mild): Secondary | ICD-10-CM | POA: Diagnosis not present

## 2017-09-12 DIAGNOSIS — E1142 Type 2 diabetes mellitus with diabetic polyneuropathy: Secondary | ICD-10-CM | POA: Diagnosis not present

## 2017-09-12 DIAGNOSIS — R1310 Dysphagia, unspecified: Secondary | ICD-10-CM | POA: Diagnosis not present

## 2017-09-12 DIAGNOSIS — D631 Anemia in chronic kidney disease: Secondary | ICD-10-CM | POA: Diagnosis not present

## 2017-09-12 DIAGNOSIS — J189 Pneumonia, unspecified organism: Secondary | ICD-10-CM | POA: Diagnosis not present

## 2017-09-12 DIAGNOSIS — I872 Venous insufficiency (chronic) (peripheral): Secondary | ICD-10-CM | POA: Diagnosis not present

## 2017-09-13 ENCOUNTER — Telehealth: Payer: Self-pay | Admitting: Physical Medicine & Rehabilitation

## 2017-09-13 DIAGNOSIS — M545 Low back pain, unspecified: Secondary | ICD-10-CM

## 2017-09-13 DIAGNOSIS — G8929 Other chronic pain: Secondary | ICD-10-CM | POA: Diagnosis not present

## 2017-09-13 DIAGNOSIS — E1142 Type 2 diabetes mellitus with diabetic polyneuropathy: Secondary | ICD-10-CM | POA: Diagnosis not present

## 2017-09-13 DIAGNOSIS — R1310 Dysphagia, unspecified: Secondary | ICD-10-CM | POA: Diagnosis not present

## 2017-09-13 DIAGNOSIS — M109 Gout, unspecified: Secondary | ICD-10-CM | POA: Diagnosis not present

## 2017-09-13 DIAGNOSIS — I130 Hypertensive heart and chronic kidney disease with heart failure and stage 1 through stage 4 chronic kidney disease, or unspecified chronic kidney disease: Secondary | ICD-10-CM | POA: Diagnosis not present

## 2017-09-13 DIAGNOSIS — I2510 Atherosclerotic heart disease of native coronary artery without angina pectoris: Secondary | ICD-10-CM | POA: Diagnosis not present

## 2017-09-13 DIAGNOSIS — N182 Chronic kidney disease, stage 2 (mild): Secondary | ICD-10-CM | POA: Diagnosis not present

## 2017-09-13 DIAGNOSIS — M5136 Other intervertebral disc degeneration, lumbar region: Secondary | ICD-10-CM

## 2017-09-13 DIAGNOSIS — E1122 Type 2 diabetes mellitus with diabetic chronic kidney disease: Secondary | ICD-10-CM | POA: Diagnosis not present

## 2017-09-13 DIAGNOSIS — I5022 Chronic systolic (congestive) heart failure: Secondary | ICD-10-CM | POA: Diagnosis not present

## 2017-09-13 DIAGNOSIS — Z8673 Personal history of transient ischemic attack (TIA), and cerebral infarction without residual deficits: Secondary | ICD-10-CM | POA: Diagnosis not present

## 2017-09-13 DIAGNOSIS — D631 Anemia in chronic kidney disease: Secondary | ICD-10-CM | POA: Diagnosis not present

## 2017-09-13 DIAGNOSIS — I872 Venous insufficiency (chronic) (peripheral): Secondary | ICD-10-CM | POA: Diagnosis not present

## 2017-09-13 DIAGNOSIS — J189 Pneumonia, unspecified organism: Secondary | ICD-10-CM | POA: Diagnosis not present

## 2017-09-13 NOTE — Telephone Encounter (Signed)
Contacted patient and passed on Dr. Charm Barges findings.  Informed patient  That [facility] should be calling to schedule an MRI.

## 2017-09-13 NOTE — Telephone Encounter (Signed)
Xray shows significant lumbar disc disease especially in the lower segments. I recommend MRI of the lumbar spine to follow up. I have ordered at [facility].

## 2017-09-16 ENCOUNTER — Inpatient Hospital Stay (HOSPITAL_COMMUNITY)
Admission: EM | Admit: 2017-09-16 | Discharge: 2017-09-18 | DRG: 389 | Disposition: A | Discharge status: Home Health | Payer: Medicare Other | Attending: Family Medicine | Admitting: Family Medicine

## 2017-09-16 ENCOUNTER — Other Ambulatory Visit: Payer: Self-pay

## 2017-09-16 DIAGNOSIS — E785 Hyperlipidemia, unspecified: Secondary | ICD-10-CM | POA: Diagnosis present

## 2017-09-16 DIAGNOSIS — Z9049 Acquired absence of other specified parts of digestive tract: Secondary | ICD-10-CM

## 2017-09-16 DIAGNOSIS — R109 Unspecified abdominal pain: Secondary | ICD-10-CM | POA: Diagnosis not present

## 2017-09-16 DIAGNOSIS — Z923 Personal history of irradiation: Secondary | ICD-10-CM | POA: Diagnosis not present

## 2017-09-16 DIAGNOSIS — R1310 Dysphagia, unspecified: Secondary | ICD-10-CM | POA: Diagnosis not present

## 2017-09-16 DIAGNOSIS — Z90710 Acquired absence of both cervix and uterus: Secondary | ICD-10-CM

## 2017-09-16 DIAGNOSIS — I2510 Atherosclerotic heart disease of native coronary artery without angina pectoris: Secondary | ICD-10-CM | POA: Diagnosis present

## 2017-09-16 DIAGNOSIS — Z79811 Long term (current) use of aromatase inhibitors: Secondary | ICD-10-CM

## 2017-09-16 DIAGNOSIS — I823 Embolism and thrombosis of renal vein: Secondary | ICD-10-CM | POA: Diagnosis not present

## 2017-09-16 DIAGNOSIS — R197 Diarrhea, unspecified: Secondary | ICD-10-CM

## 2017-09-16 DIAGNOSIS — I5032 Chronic diastolic (congestive) heart failure: Secondary | ICD-10-CM | POA: Diagnosis not present

## 2017-09-16 DIAGNOSIS — E1169 Type 2 diabetes mellitus with other specified complication: Secondary | ICD-10-CM | POA: Diagnosis not present

## 2017-09-16 DIAGNOSIS — J189 Pneumonia, unspecified organism: Secondary | ICD-10-CM | POA: Diagnosis not present

## 2017-09-16 DIAGNOSIS — Z8249 Family history of ischemic heart disease and other diseases of the circulatory system: Secondary | ICD-10-CM | POA: Diagnosis not present

## 2017-09-16 DIAGNOSIS — E1122 Type 2 diabetes mellitus with diabetic chronic kidney disease: Secondary | ICD-10-CM | POA: Diagnosis not present

## 2017-09-16 DIAGNOSIS — N183 Chronic kidney disease, stage 3 unspecified: Secondary | ICD-10-CM | POA: Diagnosis present

## 2017-09-16 DIAGNOSIS — I129 Hypertensive chronic kidney disease with stage 1 through stage 4 chronic kidney disease, or unspecified chronic kidney disease: Secondary | ICD-10-CM | POA: Diagnosis not present

## 2017-09-16 DIAGNOSIS — K831 Obstruction of bile duct: Secondary | ICD-10-CM

## 2017-09-16 DIAGNOSIS — N184 Chronic kidney disease, stage 4 (severe): Secondary | ICD-10-CM | POA: Diagnosis not present

## 2017-09-16 DIAGNOSIS — R1031 Right lower quadrant pain: Secondary | ICD-10-CM

## 2017-09-16 DIAGNOSIS — Z8673 Personal history of transient ischemic attack (TIA), and cerebral infarction without residual deficits: Secondary | ICD-10-CM

## 2017-09-16 DIAGNOSIS — Z853 Personal history of malignant neoplasm of breast: Secondary | ICD-10-CM | POA: Diagnosis not present

## 2017-09-16 DIAGNOSIS — D631 Anemia in chronic kidney disease: Secondary | ICD-10-CM | POA: Diagnosis present

## 2017-09-16 DIAGNOSIS — N189 Chronic kidney disease, unspecified: Secondary | ICD-10-CM

## 2017-09-16 DIAGNOSIS — K565 Intestinal adhesions [bands], unspecified as to partial versus complete obstruction: Principal | ICD-10-CM | POA: Diagnosis present

## 2017-09-16 DIAGNOSIS — I509 Heart failure, unspecified: Secondary | ICD-10-CM | POA: Diagnosis present

## 2017-09-16 DIAGNOSIS — Z170 Estrogen receptor positive status [ER+]: Secondary | ICD-10-CM | POA: Diagnosis not present

## 2017-09-16 DIAGNOSIS — K861 Other chronic pancreatitis: Secondary | ICD-10-CM | POA: Diagnosis not present

## 2017-09-16 DIAGNOSIS — I5022 Chronic systolic (congestive) heart failure: Secondary | ICD-10-CM | POA: Diagnosis not present

## 2017-09-16 DIAGNOSIS — I872 Venous insufficiency (chronic) (peripheral): Secondary | ICD-10-CM | POA: Diagnosis not present

## 2017-09-16 DIAGNOSIS — Z862 Personal history of diseases of the blood and blood-forming organs and certain disorders involving the immune mechanism: Secondary | ICD-10-CM | POA: Diagnosis present

## 2017-09-16 DIAGNOSIS — K56609 Unspecified intestinal obstruction, unspecified as to partial versus complete obstruction: Secondary | ICD-10-CM | POA: Diagnosis not present

## 2017-09-16 DIAGNOSIS — E1142 Type 2 diabetes mellitus with diabetic polyneuropathy: Secondary | ICD-10-CM | POA: Diagnosis not present

## 2017-09-16 DIAGNOSIS — K862 Cyst of pancreas: Secondary | ICD-10-CM | POA: Diagnosis present

## 2017-09-16 DIAGNOSIS — R112 Nausea with vomiting, unspecified: Secondary | ICD-10-CM | POA: Diagnosis not present

## 2017-09-16 DIAGNOSIS — E669 Obesity, unspecified: Secondary | ICD-10-CM | POA: Diagnosis not present

## 2017-09-16 DIAGNOSIS — I10 Essential (primary) hypertension: Secondary | ICD-10-CM | POA: Diagnosis present

## 2017-09-16 DIAGNOSIS — G8929 Other chronic pain: Secondary | ICD-10-CM | POA: Diagnosis not present

## 2017-09-16 DIAGNOSIS — I130 Hypertensive heart and chronic kidney disease with heart failure and stage 1 through stage 4 chronic kidney disease, or unspecified chronic kidney disease: Secondary | ICD-10-CM | POA: Diagnosis present

## 2017-09-16 DIAGNOSIS — D509 Iron deficiency anemia, unspecified: Secondary | ICD-10-CM | POA: Diagnosis present

## 2017-09-16 DIAGNOSIS — Z7982 Long term (current) use of aspirin: Secondary | ICD-10-CM | POA: Diagnosis not present

## 2017-09-16 DIAGNOSIS — M109 Gout, unspecified: Secondary | ICD-10-CM | POA: Diagnosis not present

## 2017-09-16 DIAGNOSIS — C50311 Malignant neoplasm of lower-inner quadrant of right female breast: Secondary | ICD-10-CM | POA: Diagnosis not present

## 2017-09-16 DIAGNOSIS — N182 Chronic kidney disease, stage 2 (mild): Secondary | ICD-10-CM | POA: Diagnosis not present

## 2017-09-16 DIAGNOSIS — I152 Hypertension secondary to endocrine disorders: Secondary | ICD-10-CM | POA: Diagnosis present

## 2017-09-16 HISTORY — DX: Unspecified intestinal obstruction, unspecified as to partial versus complete obstruction: K56.609

## 2017-09-16 LAB — COMPREHENSIVE METABOLIC PANEL
ALBUMIN: 3.8 g/dL (ref 3.5–5.0)
ALK PHOS: 44 U/L (ref 38–126)
ALT: 16 U/L (ref 0–44)
ANION GAP: 8 (ref 5–15)
AST: 18 U/L (ref 15–41)
BUN: 25 mg/dL — ABNORMAL HIGH (ref 8–23)
CHLORIDE: 106 mmol/L (ref 98–111)
CO2: 25 mmol/L (ref 22–32)
Calcium: 10 mg/dL (ref 8.9–10.3)
Creatinine, Ser: 1.48 mg/dL — ABNORMAL HIGH (ref 0.44–1.00)
GFR calc non Af Amer: 35 mL/min — ABNORMAL LOW (ref >60)
GFR, EST AFRICAN AMERICAN: 40 mL/min — AB (ref >60)
GLUCOSE: 202 mg/dL — AB (ref 70–99)
Potassium: 3.9 mmol/L (ref 3.5–5.1)
Sodium: 139 mmol/L (ref 135–145)
Total Bilirubin: 0.4 mg/dL (ref 0.3–1.2)
Total Protein: 7.9 g/dL (ref 6.5–8.1)

## 2017-09-16 LAB — CBC
HCT: 31.9 % — ABNORMAL LOW (ref 36.0–46.0)
HEMOGLOBIN: 10 g/dL — AB (ref 12.0–15.0)
MCH: 26.5 pg (ref 26.0–34.0)
MCHC: 31.3 g/dL (ref 30.0–36.0)
MCV: 84.6 fL (ref 78.0–100.0)
Platelets: 316 10*3/uL (ref 150–400)
RBC: 3.77 MIL/uL — AB (ref 3.87–5.11)
RDW: 17.6 % — ABNORMAL HIGH (ref 11.5–15.5)
WBC: 13.8 10*3/uL — ABNORMAL HIGH (ref 4.0–10.5)

## 2017-09-16 LAB — URINALYSIS, ROUTINE W REFLEX MICROSCOPIC
Bacteria, UA: NONE SEEN
Bilirubin Urine: NEGATIVE
Glucose, UA: NEGATIVE mg/dL
Hgb urine dipstick: NEGATIVE
Ketones, ur: NEGATIVE mg/dL
Leukocytes, UA: NEGATIVE
Nitrite: NEGATIVE
PROTEIN: 100 mg/dL — AB
SPECIFIC GRAVITY, URINE: 1.013 (ref 1.005–1.030)
pH: 6 (ref 5.0–8.0)

## 2017-09-16 LAB — LIPASE, BLOOD: LIPASE: 40 U/L (ref 11–51)

## 2017-09-16 MED ORDER — ONDANSETRON 4 MG PO TBDP
4.0000 mg | ORAL_TABLET | Freq: Once | ORAL | Status: AC | PRN
  Administered 2017-09-16: 4 mg via ORAL
  Filled 2017-09-16: qty 1

## 2017-09-16 NOTE — ED Triage Notes (Signed)
Patient c/o suprapubic pain with N/V that began today.

## 2017-09-17 ENCOUNTER — Inpatient Hospital Stay (HOSPITAL_COMMUNITY): Payer: Medicare Other

## 2017-09-17 ENCOUNTER — Emergency Department (HOSPITAL_COMMUNITY): Payer: Medicare Other

## 2017-09-17 ENCOUNTER — Encounter (HOSPITAL_COMMUNITY): Payer: Self-pay | Admitting: General Practice

## 2017-09-17 DIAGNOSIS — Z79811 Long term (current) use of aromatase inhibitors: Secondary | ICD-10-CM | POA: Diagnosis not present

## 2017-09-17 DIAGNOSIS — I823 Embolism and thrombosis of renal vein: Secondary | ICD-10-CM | POA: Diagnosis not present

## 2017-09-17 DIAGNOSIS — I130 Hypertensive heart and chronic kidney disease with heart failure and stage 1 through stage 4 chronic kidney disease, or unspecified chronic kidney disease: Secondary | ICD-10-CM | POA: Diagnosis present

## 2017-09-17 DIAGNOSIS — K861 Other chronic pancreatitis: Secondary | ICD-10-CM | POA: Diagnosis present

## 2017-09-17 DIAGNOSIS — K565 Intestinal adhesions [bands], unspecified as to partial versus complete obstruction: Secondary | ICD-10-CM | POA: Diagnosis present

## 2017-09-17 DIAGNOSIS — D509 Iron deficiency anemia, unspecified: Secondary | ICD-10-CM | POA: Diagnosis present

## 2017-09-17 DIAGNOSIS — N183 Chronic kidney disease, stage 3 (moderate): Secondary | ICD-10-CM | POA: Diagnosis not present

## 2017-09-17 DIAGNOSIS — D631 Anemia in chronic kidney disease: Secondary | ICD-10-CM | POA: Diagnosis present

## 2017-09-17 DIAGNOSIS — Z170 Estrogen receptor positive status [ER+]: Secondary | ICD-10-CM | POA: Diagnosis not present

## 2017-09-17 DIAGNOSIS — E785 Hyperlipidemia, unspecified: Secondary | ICD-10-CM | POA: Diagnosis present

## 2017-09-17 DIAGNOSIS — E1122 Type 2 diabetes mellitus with diabetic chronic kidney disease: Secondary | ICD-10-CM | POA: Diagnosis present

## 2017-09-17 DIAGNOSIS — Z8249 Family history of ischemic heart disease and other diseases of the circulatory system: Secondary | ICD-10-CM | POA: Diagnosis not present

## 2017-09-17 DIAGNOSIS — R935 Abnormal findings on diagnostic imaging of other abdominal regions, including retroperitoneum: Secondary | ICD-10-CM | POA: Diagnosis not present

## 2017-09-17 DIAGNOSIS — N184 Chronic kidney disease, stage 4 (severe): Secondary | ICD-10-CM | POA: Diagnosis not present

## 2017-09-17 DIAGNOSIS — Z853 Personal history of malignant neoplasm of breast: Secondary | ICD-10-CM | POA: Diagnosis not present

## 2017-09-17 DIAGNOSIS — I509 Heart failure, unspecified: Secondary | ICD-10-CM | POA: Diagnosis present

## 2017-09-17 DIAGNOSIS — E1169 Type 2 diabetes mellitus with other specified complication: Secondary | ICD-10-CM | POA: Diagnosis not present

## 2017-09-17 DIAGNOSIS — Z90710 Acquired absence of both cervix and uterus: Secondary | ICD-10-CM | POA: Diagnosis not present

## 2017-09-17 DIAGNOSIS — Z8673 Personal history of transient ischemic attack (TIA), and cerebral infarction without residual deficits: Secondary | ICD-10-CM | POA: Diagnosis not present

## 2017-09-17 DIAGNOSIS — I10 Essential (primary) hypertension: Secondary | ICD-10-CM | POA: Diagnosis not present

## 2017-09-17 DIAGNOSIS — R109 Unspecified abdominal pain: Secondary | ICD-10-CM | POA: Diagnosis present

## 2017-09-17 DIAGNOSIS — R140 Abdominal distension (gaseous): Secondary | ICD-10-CM | POA: Diagnosis not present

## 2017-09-17 DIAGNOSIS — I2510 Atherosclerotic heart disease of native coronary artery without angina pectoris: Secondary | ICD-10-CM | POA: Diagnosis not present

## 2017-09-17 DIAGNOSIS — Z7982 Long term (current) use of aspirin: Secondary | ICD-10-CM | POA: Diagnosis not present

## 2017-09-17 DIAGNOSIS — C50311 Malignant neoplasm of lower-inner quadrant of right female breast: Secondary | ICD-10-CM | POA: Diagnosis not present

## 2017-09-17 DIAGNOSIS — E669 Obesity, unspecified: Secondary | ICD-10-CM | POA: Diagnosis present

## 2017-09-17 DIAGNOSIS — Z923 Personal history of irradiation: Secondary | ICD-10-CM | POA: Diagnosis not present

## 2017-09-17 DIAGNOSIS — K56609 Unspecified intestinal obstruction, unspecified as to partial versus complete obstruction: Secondary | ICD-10-CM

## 2017-09-17 DIAGNOSIS — Z9049 Acquired absence of other specified parts of digestive tract: Secondary | ICD-10-CM | POA: Diagnosis not present

## 2017-09-17 DIAGNOSIS — K862 Cyst of pancreas: Secondary | ICD-10-CM | POA: Diagnosis present

## 2017-09-17 HISTORY — DX: Unspecified intestinal obstruction, unspecified as to partial versus complete obstruction: K56.609

## 2017-09-17 LAB — CBC
HCT: 31.1 % — ABNORMAL LOW (ref 36.0–46.0)
HEMOGLOBIN: 9.5 g/dL — AB (ref 12.0–15.0)
MCH: 26 pg (ref 26.0–34.0)
MCHC: 30.5 g/dL (ref 30.0–36.0)
MCV: 85 fL (ref 78.0–100.0)
Platelets: 313 10*3/uL (ref 150–400)
RBC: 3.66 MIL/uL — AB (ref 3.87–5.11)
RDW: 17.5 % — ABNORMAL HIGH (ref 11.5–15.5)
WBC: 12.2 10*3/uL — ABNORMAL HIGH (ref 4.0–10.5)

## 2017-09-17 LAB — BASIC METABOLIC PANEL
ANION GAP: 9 (ref 5–15)
BUN: 23 mg/dL (ref 8–23)
CO2: 24 mmol/L (ref 22–32)
Calcium: 9.5 mg/dL (ref 8.9–10.3)
Chloride: 104 mmol/L (ref 98–111)
Creatinine, Ser: 1.26 mg/dL — ABNORMAL HIGH (ref 0.44–1.00)
GFR calc non Af Amer: 42 mL/min — ABNORMAL LOW (ref >60)
GFR, EST AFRICAN AMERICAN: 49 mL/min — AB (ref >60)
GLUCOSE: 209 mg/dL — AB (ref 70–99)
POTASSIUM: 3.6 mmol/L (ref 3.5–5.1)
Sodium: 137 mmol/L (ref 135–145)

## 2017-09-17 LAB — GLUCOSE, CAPILLARY
GLUCOSE-CAPILLARY: 112 mg/dL — AB (ref 70–99)
GLUCOSE-CAPILLARY: 119 mg/dL — AB (ref 70–99)
Glucose-Capillary: 210 mg/dL — ABNORMAL HIGH (ref 70–99)
Glucose-Capillary: 139 mg/dL — ABNORMAL HIGH (ref 70–99)
Glucose-Capillary: 171 mg/dL — ABNORMAL HIGH (ref 70–99)

## 2017-09-17 LAB — HEPARIN LEVEL (UNFRACTIONATED)
Heparin Unfractionated: 0.35 IU/mL (ref 0.30–0.70)
Heparin Unfractionated: 0.14 IU/mL — ABNORMAL LOW (ref 0.30–0.70)

## 2017-09-17 MED ORDER — SODIUM CHLORIDE 0.9 % IV BOLUS
500.0000 mL | Freq: Once | INTRAVENOUS | Status: AC
  Administered 2017-09-17: 500 mL via INTRAVENOUS

## 2017-09-17 MED ORDER — FAMOTIDINE IN NACL 20-0.9 MG/50ML-% IV SOLN
20.0000 mg | Freq: Two times a day (BID) | INTRAVENOUS | Status: DC
  Administered 2017-09-17 – 2017-09-18 (×3): 20 mg via INTRAVENOUS
  Filled 2017-09-17 (×3): qty 50

## 2017-09-17 MED ORDER — PANTOPRAZOLE SODIUM 40 MG IV SOLR
40.0000 mg | Freq: Every day | INTRAVENOUS | Status: DC
  Administered 2017-09-17 – 2017-09-18 (×2): 40 mg via INTRAVENOUS
  Filled 2017-09-17 (×2): qty 40

## 2017-09-17 MED ORDER — ACETAMINOPHEN 650 MG RE SUPP: 650.0000 mg | Freq: Four times a day (QID) | RECTAL | Status: DC | PRN

## 2017-09-17 MED ORDER — ONDANSETRON HCL 4 MG/2ML IJ SOLN
4.0000 mg | Freq: Once | INTRAMUSCULAR | Status: AC
  Administered 2017-09-17: 4 mg via INTRAVENOUS
  Filled 2017-09-17: qty 2

## 2017-09-17 MED ORDER — IOHEXOL 300 MG/ML  SOLN
100.0000 mL | Freq: Once | INTRAMUSCULAR | Status: AC | PRN
  Administered 2017-09-17: 75 mL via INTRAVENOUS

## 2017-09-17 MED ORDER — ONDANSETRON HCL 4 MG/2ML IJ SOLN
4.0000 mg | Freq: Four times a day (QID) | INTRAMUSCULAR | Status: DC | PRN
  Administered 2017-09-17: 4 mg via INTRAVENOUS
  Filled 2017-09-17: qty 2

## 2017-09-17 MED ORDER — PROMETHAZINE HCL 25 MG/ML IJ SOLN
12.5000 mg | Freq: Four times a day (QID) | INTRAMUSCULAR | Status: DC | PRN
Start: 2017-09-17 — End: 2017-09-18

## 2017-09-17 MED ORDER — ONDANSETRON HCL 4 MG PO TABS: 4.0000 mg | ORAL_TABLET | Freq: Four times a day (QID) | ORAL | Status: DC | PRN

## 2017-09-17 MED ORDER — ACETAMINOPHEN 325 MG PO TABS: 650.0000 mg | ORAL_TABLET | Freq: Four times a day (QID) | ORAL | Status: DC | PRN

## 2017-09-17 MED ORDER — SODIUM CHLORIDE 0.9 % IV SOLN
INTRAVENOUS | Status: AC
  Administered 2017-09-17 (×2): via INTRAVENOUS

## 2017-09-17 MED ORDER — HYDRALAZINE HCL 20 MG/ML IJ SOLN: 10.0000 mg | INTRAMUSCULAR | Status: DC | PRN

## 2017-09-17 MED ORDER — HEPARIN (PORCINE) IN NACL 100-0.45 UNIT/ML-% IJ SOLN
1300.0000 [IU]/h | INTRAMUSCULAR | Status: DC
  Administered 2017-09-17: 1150 [IU]/h via INTRAVENOUS
  Administered 2017-09-18: 1300 [IU]/h via INTRAVENOUS
  Filled 2017-09-17 (×2): qty 250

## 2017-09-17 MED ORDER — GADOBENATE DIMEGLUMINE 529 MG/ML IV SOLN
15.0000 mL | Freq: Once | INTRAVENOUS | Status: AC | PRN
  Administered 2017-09-17: 15 mL via INTRAVENOUS

## 2017-09-17 MED ORDER — FENTANYL CITRATE (PF) 100 MCG/2ML IJ SOLN
50.0000 ug | INTRAMUSCULAR | Status: DC | PRN
  Administered 2017-09-17: 50 ug via INTRAVENOUS
  Filled 2017-09-17 (×2): qty 2

## 2017-09-17 MED ORDER — FENTANYL CITRATE (PF) 100 MCG/2ML IJ SOLN
25.0000 ug | INTRAMUSCULAR | Status: DC | PRN
  Administered 2017-09-17 – 2017-09-18 (×4): 50 ug via INTRAVENOUS
  Filled 2017-09-17 (×3): qty 2

## 2017-09-17 MED ORDER — HEPARIN BOLUS VIA INFUSION
3500.0000 [IU] | Freq: Once | INTRAVENOUS | Status: AC
  Administered 2017-09-17: 3500 [IU] via INTRAVENOUS
  Filled 2017-09-17: qty 3500

## 2017-09-17 MED ORDER — INSULIN ASPART 100 UNIT/ML ~~LOC~~ SOLN
0.0000 [IU] | SUBCUTANEOUS | Status: DC
  Administered 2017-09-17: 2 [IU] via SUBCUTANEOUS
  Administered 2017-09-17: 3 [IU] via SUBCUTANEOUS

## 2017-09-17 MED ORDER — LIDOCAINE VISCOUS HCL 2 % MT SOLN
15.0000 mL | Freq: Once | OROMUCOSAL | Status: DC
  Filled 2017-09-17: qty 15

## 2017-09-17 NOTE — Progress Notes (Signed)
   Follow Up Note  HPI: 72 year old female with past medical history of hypertension, CAD, history of breast cancer and previous small bowel obstruction 1 month ago admitted for 1 day of generalized abdominal pain with nausea and vomiting.  Prior to coming in the patient had a bowel movement.  In the emergency room, CT scan done noting early bowel obstruction and also incidentally noted left renal vein thrombosis as well as enlarged pancreatic cystic duct with questionable structure. Pt admitted earlier this morning.  Seen after arrived to floor.  She is doing better, no complaints.  Currently no abdominal pain or nausea.  Exam: CV: Regular rate and rhythm, S1-S2 Lungs: Clear to auscultation bilaterally Abd: Soft, nontender, few bowel sounds, but present Ext: No clubbing cyanosis or edema  Principal Problem:   SBO (small bowel obstruction) ([place]): Follow-up x-ray done at noon noted only some bowel gas.  I started clear liquids on patient.  She is unable to start this until after MR studies.  A bowel obstruction is resolved and patient tolerating p.o. and confirm diagnosis and plans made for abdominal findings, could potentially discharge home tomorrow. Active Problems:   CAD (coronary artery disease): Stable   Anemia in chronic kidney disease: Stable, follow creatinine   History of stroke without residual deficits   Chronic kidney disease, stage III (moderate) (HCC)   Diabetes mellitus type 2 in obese [facility])   Breast cancer of lower-inner quadrant of right female breast ([place])   Essential hypertension: Stable   Renal vein thrombosis (HCC)/abnormal pancreatic cystic duct findings: Discussed with radiology.  Checking MRV of abdomen as well as MRCP as well.  N.p.o. until study complete   Disposition:

## 2017-09-17 NOTE — Progress Notes (Signed)
[place] for Heparin Indication: DVT  Allergies  Allergen Reactions  . Percocet [Oxycodone-Acetaminophen] Other (See Comments)    headaches  . Diazepam Other (See Comments)    REACTION: Agitation  . Haloperidol Lactate Nausea And Vomiting  . Lorazepam Nausea Only  . Morphine Sulfate Other (See Comments)    REACTION: Agitation  . Propoxyphene Hcl Nausea And Vomiting  . Tramadol Hcl Nausea And Vomiting    Patient Measurements: Height: 5' 4"  (162.6 cm) Weight: 173 lb (78.5 kg) IBW/kg (Calculated) : 54.7 Heparin Dosing Weight: 71.4 kg  Vital Signs: Temp: 98.3 F (36.8 C) (07/02 2058) Temp Source: Oral (07/02 2058) BP: 163/77 (07/02 2058) Pulse Rate: 83 (07/02 2058)  Labs: Recent Labs    09/16/17 2216 09/17/17 0323 09/17/17 1015 09/17/17 1741 09/17/17 2243  HGB 10.0* 9.5*  --   --   --   HCT 31.9* 31.1*  --   --   --   PLT 316 313  --   --   --   HEPARINUNFRC  --   --  0.35 <0.10* 0.14*  CREATININE 1.48* 1.26*  --   --   --     Assessment: 72 yo female with acute renal vein thrombosis. Heparin was started early this morning. Hgb 9.5. Renal function ok.   Heparin level is  <0.1 but RN reports heparin was off x 2 hrs while pt in MRI. HL drawn right after pt returned from MRI, thus unable to use this level.  Heparin restarted at 17:00.  No bleeding noted per RN.   7/2 PM update: heparin level is sub-therapeutic after re-start s/p holding for 2 hours in MRI  Goal of Therapy:  Heparin level 0.3-0.7 units/ml Monitor platelets by anticoagulation protocol: Yes   Plan:  -Inc heparin to 1300 units/hr -0800 HL  Narda Bonds, PharmD, BCPS Clinical Pharmacist Phone: [phone]

## 2017-09-17 NOTE — H&P (Addendum)
History and Physical    [name] WIO:[id number] DOB: 1946-01-08 DOA: 09/16/2017  PCP: Meredith Staggers, MD   Patient coming from: Home  Chief Complaint: Abdominal pain, nausea, vomiting, diarrhea   HPI: [name] is a 72 y.o. female with medical history significant for hypertension, coronary artery disease, iron deficiency anemia, history of breast cancer status post lumpectomy and radiation, and admission in May for septic shock secondary to multifocal pneumonia, now presenting to the emergency department for evaluation of abdominal pain.  Patient reports that she had been in her usual state of health until earlier today when she developed severe pain in the lower abdomen associated with nausea, nonbloody vomiting, and nonbloody diarrhea.  Pain has been constant, severe at times but waxing and waning, and with no alleviating or exacerbating factors identified.  She denies any fevers or chills, chest pain, shortness breath, or cough.  Patient denies melena or hematochezia.  ED Course: Upon arrival to the ED, patient is found to be afebrile, saturating well on room air, and with vitals otherwise stable.  Chemistry panel is notable for creatinine 1.48, slightly up from most recent prior of 1.20.  CBC features a leukocytosis to 13,800 and a improved chronic normocytic anemia with hemoglobin of 10.0.  Urinalysis is notable for proteinuria.  CT of the abdomen and pelvis is concerning for SBO with transition in the pelvis and likely secondary to adhesions.  Also noted on the CT is a left renal vein thrombosis of indeterminate etiology.  Patient was given 500 cc normal saline, Zofran, and fentanyl in the ED.  She remains hemodynamically stable and will be admitted for ongoing evaluation and management of SBO and renal vein thrombosis.  Review of Systems:  All other systems reviewed and apart from HPI, are negative.  Past Medical History:  Diagnosis Date  . Abdominal discomfort    Chronic N/V/D.  Presumptive dx Crohn's dx per elevated p ANCA. Failed Entocort and Pentasa. Sep 2003 - ileocolectomy c anastomosis per Dr Deon Pilling 2/2 adhesions - path was hegative for Crohns. EGD, Sm bowel follow through (11/03), and an eteroclysis (10/03) were unrevealing. Cuases hypomag and hypocalcemia.  . Adnexal mass 8/03   s/p lap BSO (R ovarian fibroma) & lysis of adhesions  . Allergy    Seasonal  . Anemia    Multifactorial. Baseline HgB 10-11 ish. B12 def - 150 in 3/10. Fe Def - ferritin 35 3/10. Both are being repleted.  . Breast cancer ([place]) 03/16/13   right, 5 o'clock  . CAD (coronary artery disease) 1996   1996 - PTCA and angioplasty diagonal branch. 2000 - Rotoblator & angiopllasty of diagonal. 2006 - subendocardial AMI, DES to proximal LAD.Marland Kitchen Also had 90% stenosis in distal apical LAD. EF 55 with apical hypokinesis. Indefinite ASA and Plavix.  . CHF (congestive heart failure) ([place])   . Chronic kidney disease    Chronic renal insuff baseline Cr 1.2 - 1.4 ish.  . Chronic pain    CT 10/10 = Spinal stenosis L2 - S1.  . Diabetes mellitus    Insulin dependent  . Hx of radiation therapy 06/02/13- 07/16/13   right rbeast 4500 cGy 25 sessions, right breast boost 1600 cGy in 8 sessions  . Hyperlipidemia    Managed with both a statin and Welchol. Welchol stopped 2014 2/2 cost and started on fenofibrate   . Hypertension    2006 B renal arteries patent. 2003 MRA - no RAS. 2003 pheo W/U Dr Hassell Done reportedly negative.  Marland Kitchen  Hypoxia 07/23/2017  . Lupus ([place])   . Personal history of radiation therapy   . RBBB   . Stroke [facility])    Incidental finding MRI 2002 L lacunar infarct  . Wears dentures    top    Past Surgical History:  Procedure Laterality Date  . ABDOMINAL HYSTERECTOMY    . BILATERAL SALPINGOOPHORECTOMY  8/03   Lap BSO (R ovarian fibroma) and adhesion lysis  . BOWEL RESECTION  2003   ileocolectomy with anastomosis 2/2 adhesions  . BREAST BIOPSY    . BREAST LUMPECTOMY Right   . BREAST LUMPECTOMY  WITH NEEDLE LOCALIZATION AND AXILLARY SENTINEL LYMPH NODE BX Right 04/22/2013   Procedure: BREAST LUMPECTOMY WITH NEEDLE LOCALIZATION AND AXILLARY SENTINEL LYMPH NODE BX;  Surgeon: Stark Klein, MD;  Location: [place];  Service: General;  Laterality: Right;  . CARDIAC CATHETERIZATION     2 stents  . CHOLECYSTECTOMY    . HEMICOLECTOMY     R sided hemicolectomy  . HERNIA REPAIR     Ventral hernia repair  . PTCA  4/06     reports that she has never smoked. She has never used smokeless tobacco. She reports that she does not drink alcohol or use drugs.  Allergies  Allergen Reactions  . Percocet [Oxycodone-Acetaminophen] Other (See Comments)    headaches  . Diazepam Other (See Comments)    REACTION: Agitation  . Haloperidol Lactate Nausea And Vomiting  . Lorazepam Nausea Only  . Morphine Sulfate Other (See Comments)    REACTION: Agitation  . Propoxyphene Hcl Nausea And Vomiting  . Tramadol Hcl Nausea And Vomiting    Family History  Problem Relation Age of Onset  . Pancreatic cancer Brother 36  . Cancer Mother   . Breast cancer Mother 71  . Lung cancer Maternal Aunt   . Breast cancer Maternal Aunt   . Prostate cancer Maternal Uncle   . Heart attack Maternal Grandmother   . Breast cancer Maternal Grandmother   . Kidney failure Maternal Aunt   . Hypertension Daughter      Prior to Admission medications   Medication Sig Start Date End Date Taking? Authorizing Provider  acetaminophen (TYLENOL) 325 MG tablet Take 650 mg by mouth every 6 (six) hours as needed for mild pain.    [provider]  amLODipine (NORVASC) 10 MG tablet Take 1 tablet (10 mg total) by mouth daily. 08/22/17   Angiulli, Lavon Paganini, PA-C  anastrozole (ARIMIDEX) 1 MG tablet Take 1 tablet (1 mg total) by mouth daily. 12/19/16   Truitt Merle, MD  aspirin EC 81 MG tablet Take 81 mg by mouth daily.    [provider]  atorvastatin (LIPITOR) 40 MG tablet Take 1 tablet (40 mg total) by mouth  at bedtime. 08/22/17   Angiulli, Lavon Paganini, PA-C  carvedilol (COREG) 25 MG tablet Take 1 tablet (25 mg total) by mouth 2 (two) times daily with a meal. 08/22/17   Angiulli, Lavon Paganini, PA-C  cyanocobalamin (,VITAMIN B-12,) 1000 MCG/ML injection Inject 1 mL (1,000 mcg total) into the muscle every 30 (thirty) days. 09/12/16   Bartholomew Crews, MD  desloratadine (CLARINEX) 5 MG tablet Take 5 mg by mouth at bedtime as needed (allergies). Reported on 04/11/2015 04/10/13   [provider]  famotidine (PEPCID) 20 MG tablet Take 1 tablet (20 mg total) by mouth daily. 08/22/17   Angiulli, Lavon Paganini, PA-C  fenofibrate 54 MG tablet Take 54 mg by mouth daily.  09/21/15  [provider]  ferrous fumarate (HEMOCYTE - 106 MG FE) 325 (106 Fe) MG TABS tablet Take 1 tablet (106 mg of iron total) by mouth daily. 08/22/17   Angiulli, Lavon Paganini, PA-C  furosemide (LASIX) 40 MG tablet Take 1 tablet (40 mg total) by mouth daily as needed for edema. 08/26/17   Geradine Girt, DO  loperamide (IMODIUM) 2 MG capsule TAKE ONE CAPSULE BY MOUTH ONCE DAILY AS NEEDED FOR DIARRHEA OR LOOSE STOOLS 05/24/14   Bartholomew Crews, MD  losartan (COZAAR) 100 MG tablet Take 1 tablet (100 mg total) by mouth daily. 08/22/17   Angiulli, Lavon Paganini, PA-C  Melatonin 3 MG TABS Take 0.5 tablets (1.5 mg total) by mouth at bedtime. 08/22/17   Angiulli, Lavon Paganini, PA-C  nitroGLYCERIN (NITROSTAT) 0.4 MG SL tablet Place 1 tablet (0.4 mg total) under the tongue every 5 (five) minutes as needed for chest pain. 12/06/16   Josue Hector, MD  pantoprazole (PROTONIX) 40 MG tablet Take 1 tablet (40 mg total) by mouth daily. 08/22/17   Angiulli, Lavon Paganini, PA-C  potassium chloride SA (K-DUR,KLOR-CON) 20 MEQ tablet Take 1 tablet (20 mEq total) by mouth daily as needed (when taking lasix). 08/26/17   Eulogio Bear U, DO  ULORIC 40 MG tablet Take 1 tablet by mouth daily. 06/20/16   [provider]    Physical Exam: Vitals:   09/16/17 2202 09/16/17 2203  09/17/17 0004 09/17/17 0200  BP:  137/69 (!) 150/68 (!) 176/78  Pulse:  93 96 98  Resp:  20 16   Temp:  98.1 F (36.7 C)    TempSrc:  Oral    SpO2:  99% 96% 100%  Weight: 78.5 kg (173 lb)     Height: 5' 4"  (1.626 m)         Constitutional: NAD, calm  Eyes: PERTLA, lids and conjunctivae normal ENMT: Mucous membranes are moist. Posterior pharynx clear of any exudate or lesions.   Neck: normal, supple, no masses, no thyromegaly Respiratory: clear to auscultation bilaterally, no wheezing, no crackles. Normal respiratory effort.   Cardiovascular: S1 & S2 heard, regular rate and rhythm. No significant JVD. Abdomen: Mild distension, tender in suprapubic region, no rebound pain or guarding. Bowel sounds active.  Musculoskeletal: no clubbing / cyanosis. No joint deformity upper and lower extremities.   Skin: no significant rashes, lesions, ulcers. Warm, dry, well-perfused. Neurologic: No facial asymmetry. Sensation to light touch intact. Moving all extremities.  Psychiatric: Alert and oriented to person, place, and situarion. Pleasant and cooperative.     Labs on Admission: I have personally reviewed following labs and imaging studies  CBC: Recent Labs  Lab 09/16/17 2216  WBC 13.8*  HGB 10.0*  HCT 31.9*  MCV 84.6  PLT 224   Basic Metabolic Panel: Recent Labs  Lab 09/16/17 2216  NA 139  K 3.9  CL 106  CO2 25  GLUCOSE 202*  BUN 25*  CREATININE 1.48*  CALCIUM 10.0   GFR: Estimated Creatinine Clearance: 35.8 mL/min (A) (by C-G formula based on SCr of 1.48 mg/dL (H)). Liver Function Tests: Recent Labs  Lab 09/16/17 2216  AST 18  ALT 16  ALKPHOS 44  BILITOT 0.4  PROT 7.9  ALBUMIN 3.8   Recent Labs  Lab 09/16/17 2216  LIPASE 40   No results for input(s): AMMONIA in the last 168 hours. Coagulation Profile: No results for input(s): INR, PROTIME in the last 168 hours. Cardiac Enzymes: No results for input(s): CKTOTAL, CKMB, CKMBINDEX,  TROPONINI in the last 168  hours. BNP (last 3 results) No results for input(s): PROBNP in the last 8760 hours. HbA1C: No results for input(s): HGBA1C in the last 72 hours. CBG: No results for input(s): GLUCAP in the last 168 hours. Lipid Profile: No results for input(s): CHOL, HDL, LDLCALC, TRIG, CHOLHDL, LDLDIRECT in the last 72 hours. Thyroid Function Tests: No results for input(s): TSH, T4TOTAL, FREET4, T3FREE, THYROIDAB in the last 72 hours. Anemia Panel: No results for input(s): VITAMINB12, FOLATE, FERRITIN, TIBC, IRON, RETICCTPCT in the last 72 hours. Urine analysis:    Component Value Date/Time   COLORURINE YELLOW 09/16/2017 2212   APPEARANCEUR CLEAR 09/16/2017 2212   LABSPEC 1.013 09/16/2017 2212   LABSPEC 1.020 03/03/2014 1341   PHURINE 6.0 09/16/2017 2212   GLUCOSEU NEGATIVE 09/16/2017 2212   GLUCOSEU Negative 03/03/2014 1341   HGBUR NEGATIVE 09/16/2017 2212   BILIRUBINUR NEGATIVE 09/16/2017 2212   BILIRUBINUR Negative 03/03/2014 1341   KETONESUR NEGATIVE 09/16/2017 2212   PROTEINUR 100 (A) 09/16/2017 2212   UROBILINOGEN 0.2 03/03/2014 1341   NITRITE NEGATIVE 09/16/2017 2212   LEUKOCYTESUR NEGATIVE 09/16/2017 2212   LEUKOCYTESUR Negative 03/03/2014 1341   Sepsis Labs: @LABRCNTIP (procalcitonin:4,lacticidven:4) )No results found for this or any previous visit (from the past 240 hour(s)).   Radiological Exams on Admission: Ct Abdomen Pelvis W Contrast  Result Date: 09/17/2017 CLINICAL DATA:  72 year old female with abdominal pain. EXAM: CT ABDOMEN AND PELVIS WITH CONTRAST TECHNIQUE: Multidetector CT imaging of the abdomen and pelvis was performed using the standard protocol following bolus administration of intravenous contrast. CONTRAST:  21m OMNIPAQUE IOHEXOL 300 MG/ML  SOLN COMPARISON:  CT of the abdomen pelvis dated 07/19/2016 FINDINGS: Lower chest: The visualized lung bases are clear. There is mild cardiomegaly. Coronary vascular calcification noted. Small pericardial effusion. No  intra-abdominal free air or free fluid. Hepatobiliary: Cholecystectomy. There is mild intrahepatic biliary ductal dilatation, likely post cholecystectomy. The liver is unremarkable. No retained calcified stone noted in the central CBD. Pancreas: Mildly dilated pancreatic duct measuring approximately 4 mm diameter. This finding is similar to prior CT and may be related to sequela of chronic pancreatitis. There is a 9 x 11 mm apparent soft tissue density in the region of the ampulla (series 3 image 32 and coronal series 6, image 51). This may be artifactual and related to volume averaging artifact with adjacent soft tissues. An ampullary lesion is not excluded. Further evaluation with MRCP is recommended. No active inflammatory changes. Spleen: Normal in size without focal abnormality. Adrenals/Urinary Tract: The adrenal glands are unremarkable. There is a 1.5 cm right renal interpolar cyst. Additional subcentimeter renal hypodense lesions are too small to characterize. There is no hydronephrosis on either side. The visualized ureters and urinary bladder appear unremarkable. Stomach/Bowel: There is a small hiatal hernia. There is postsurgical changes of partial bowel resection with ileocecal anastomosis in the right lower quadrant. There is dilatation of multiple fluid-filled loops of small bowel in the lower abdomen measuring up to 3.8 cm in diameter. There is a transition zone in the pelvis superior to the bladder (series 3 image 71 and coronal series 6, image 56), likely secondary to adhesions. There is abutment of multiple loops of small bowel to the anterior peritoneal wall compatible with adhesions. There is mild inflammatory changes of these loops of bowel. The appendix is surgically absent. Vascular/Lymphatic: There is a large left renal vein thrombus extending to the junction of the IVC. No definite clot identified within the IVC. The etiology of the  clot is not excluded but may be related to underlying  systemic/metabolic disease. However, a tumor thrombus is not entirely excluded. No definite suspicious mass noted in the left kidney by CT. However, further evaluation with MRI or CT urography on a nonemergent basis is recommended. There is advanced aortoiliac atherosclerotic disease. The IVC appears unremarkable. No portal venous gas. There is no adenopathy. Reproductive: Hysterectomy.  No pelvic mass. Other: Midline vertical anterior pelvic wall incisional scar. Mild diffuse subcutaneous edema. Focal area of haziness in the omentum (series 3, image 44) likely represents an area of omental contusion or infarct. No fluid collection. Musculoskeletal: Degenerative changes of the spine. No acute osseous pathology. There is thickening of the right breast skin with infiltration of the fat in the right breast tissue. A 1.9 x 1.5 cm nodular density associated with multiple adjacent surgical clips noted in the retroareolar region. Clinical correlation is recommended. No fluid collection. IMPRESSION: 1. Small-bowel obstruction with transition in the pelvis likely secondary to adhesions. 2. Left renal vein thrombus of indeterminate etiology. Although this may represent a bland thrombus related to underlying systemic/metabolic condition, tumor thrombus is not entirely excluded. Further evaluation with MRI or CT urography on a nonemergent basis is recommended to exclude an occult renal neoplasm. No definite extension of tumor into the IVC on this CT. 3. Mildly dilated main pancreatic duct similar to prior CT, likely chronic and sequela of chronic pancreatitis. An ill-defined nodular soft tissue density in the region of the ampulla is likely artifactual and related to volume-averaging artifact. An ampullary lesion is not entirely excluded. Further evaluation with MRCP on a nonemergent basis is recommended. 4.  Aortic Atherosclerosis (ICD10-I70.0). These results were called by telephone at the time of interpretation on 09/17/2017  at 2:00 am to Dr. Nanda Quinton , who verbally acknowledged these results. Electronically Signed   By: Anner Crete M.D.   On: 09/17/2017 02:04    EKG: Not performed.   Assessment/Plan   1. SBO  - Presents with lower abdominal pain, nausea, vomiting, and diarrhea  - Found to have SBO on CT with transition in pelvis and likely secondary to adhesions  - Bowel sounds are active she reports having a large loose stool a few hours prior to admission, suggesting this may be partial or intermittent  - Nausea/vomiting has been well-controlled with antiemetics so will hold-off on NGT for now  - Continue bowel-rest, IVF hydration, prn antiemetics and analgesia, serial exams    2. Left renal vein thrombosis  - Noted incidentally on CT abd/pelvis, not seen on contrast-enhanced CT abd from 1 yr prior and no evidence for renal tumor identified  - No evidence for PE  - Start IV heparin infusion, consider MRI abd or CT urography for further eval   3. CKD stage II-III - SCr is 1.40 on admission, improved from most recent prior of 1.20, but has been 1-3 over last couple months  - Renally-dose medications, continue IVF hydration while NPO, repeat chem panel in am    4. CAD - No anginal complaints  - Resume ASA, statin, ARB, and beta-blocker once appropriate for PO intake   5. Iron-deficiency anemia  - Hgb is 10.0 on admission, improved from priors  - Attributed to IDA and has improved since starting iron-supplements  - Pt denies melena or hematochezia  - She takes H2-blocker and PPI at home, will continue IV while NPO and on heparin infusion - Monitor closely while starting anticoagulation    6. Hypertension  -  BP slightly elevated in ED  - Use hydralazine IVP's prn for now while NPO    7. Hx of CVA  - No new deficits reported  - Resume ASA and statin once tolerating oral intake    8. Hx of breast cancer  - Status-post lumpectomy and radiation  - Resume Arimidex once tolerating PO    9.  Diet-controlled DM - A1c was 8.7% in January 2019  - Follow CBG's and use SSI with Novolog as needed while in hospital    DVT prophylaxis: IV heparin infusion  Code Status: Full  Family Communication: Discussed with patient  Consults called: None Admission status: Inpatient    Vianne Bulls, MD [facility] Pager [phone]  If 7PM-7AM, please contact night-coverage [email or web address] Password [facility]  09/17/2017, 3:19 AM

## 2017-09-17 NOTE — Progress Notes (Signed)
[place] for heparin Indication: DVT  Allergies  Allergen Reactions  . Percocet [Oxycodone-Acetaminophen] Other (See Comments)    headaches  . Diazepam Other (See Comments)    REACTION: Agitation  . Haloperidol Lactate Nausea And Vomiting  . Lorazepam Nausea Only  . Morphine Sulfate Other (See Comments)    REACTION: Agitation  . Propoxyphene Hcl Nausea And Vomiting  . Tramadol Hcl Nausea And Vomiting    Patient Measurements: Height: 5' 4"  (162.6 cm) Weight: 173 lb (78.5 kg) IBW/kg (Calculated) : 54.7 Heparin Dosing Weight: 71.4 kg  Vital Signs: Temp: 98.6 F (37 C) (07/02 0400) Temp Source: Oral (07/02 0400) BP: 152/67 (07/02 0400) Pulse Rate: 95 (07/02 0400)  Labs: Recent Labs    09/16/17 2216 09/17/17 0323 09/17/17 1015  HGB 10.0* 9.5*  --   HCT 31.9* 31.1*  --   PLT 316 313  --   HEPARINUNFRC  --   --  0.35  CREATININE 1.48* 1.26*  --     Assessment: 72 yo lady with acute renal vein thrombosis. Heparin was started early this morning. Initial heparin level is therapeutic. SCr 1.26, cbc ok.   Goal of Therapy:  Heparin level 0.3-0.7 units/ml Monitor platelets by anticoagulation protocol: Yes    Plan:  -Continue heparin at 1150 units/hr -Daily HL, CBC -Check cHL -F/u plan for long-term anticoagulation   Unknown [name], [name] 09/17/2017,11:38 AM

## 2017-09-17 NOTE — ED Notes (Signed)
Report to Max RN

## 2017-09-17 NOTE — Progress Notes (Signed)
ANTICOAGULATION CONSULT NOTE - Initial Consult  Pharmacy Consult for heparin Indication: DVT  Allergies  Allergen Reactions  . Percocet [Oxycodone-Acetaminophen] Other (See Comments)    headaches  . Diazepam Other (See Comments)    REACTION: Agitation  . Haloperidol Lactate Nausea And Vomiting  . Lorazepam Nausea Only  . Morphine Sulfate Other (See Comments)    REACTION: Agitation  . Propoxyphene Hcl Nausea And Vomiting  . Tramadol Hcl Nausea And Vomiting    Patient Measurements: Height: 5' 4"  (162.6 cm) Weight: 173 lb (78.5 kg) IBW/kg (Calculated) : 54.7 Heparin Dosing Weight: 71.4 kg  Vital Signs: Temp: 98.1 F (36.7 C) (07/01 2203) Temp Source: Oral (07/01 2203) BP: 176/78 (07/02 0200) Pulse Rate: 98 (07/02 0200)  Labs: Recent Labs    09/16/17 2216  HGB 10.0*  HCT 31.9*  PLT 316  CREATININE 1.48*    Estimated Creatinine Clearance: 35.8 mL/min (A) (by C-G formula based on SCr of 1.48 mg/dL (H)).   Medical History: Past Medical History:  Diagnosis Date  . Abdominal discomfort    Chronic N/V/D. Presumptive dx Crohn's dx per elevated p ANCA. Failed Entocort and Pentasa. Sep 2003 - ileocolectomy c anastomosis per Dr Deon Pilling 2/2 adhesions - path was hegative for Crohns. EGD, Sm bowel follow through (11/03), and an eteroclysis (10/03) were unrevealing. Cuases hypomag and hypocalcemia.  . Adnexal mass 8/03   s/p lap BSO (R ovarian fibroma) & lysis of adhesions  . Allergy    Seasonal  . Anemia    Multifactorial. Baseline HgB 10-11 ish. B12 def - 150 in 3/10. Fe Def - ferritin 35 3/10. Both are being repleted.  . Breast cancer ([place]) 03/16/13   right, 5 o'clock  . CAD (coronary artery disease) 1996   1996 - PTCA and angioplasty diagonal branch. 2000 - Rotoblator & angiopllasty of diagonal. 2006 - subendocardial AMI, DES to proximal LAD.Marland Kitchen Also had 90% stenosis in distal apical LAD. EF 55 with apical hypokinesis. Indefinite ASA and Plavix.  . CHF (congestive heart  failure) ([place])   . Chronic kidney disease    Chronic renal insuff baseline Cr 1.2 - 1.4 ish.  . Chronic pain    CT 10/10 = Spinal stenosis L2 - S1.  . Diabetes mellitus    Insulin dependent  . Hx of radiation therapy 06/02/13- 07/16/13   right rbeast 4500 cGy 25 sessions, right breast boost 1600 cGy in 8 sessions  . Hyperlipidemia    Managed with both a statin and Welchol. Welchol stopped 2014 2/2 cost and started on fenofibrate   . Hypertension    2006 B renal arteries patent. 2003 MRA - no RAS. 2003 pheo W/U Dr Hassell Done reportedly negative.  . Hypoxia 07/23/2017  . Lupus ([place])   . Personal history of radiation therapy   . RBBB   . Stroke [facility]    Incidental finding MRI 2002 L lacunar infarct  . Wears dentures    top    Medications:  See medication history, was not on anticoagulation PTA  Assessment: 72 yo lady with renal vein thrombosis to start heparin therapy.  Baseline Hg 10.0, PTLC 316. Goal of Therapy:  Heparin level 0.3-0.7 units/ml Monitor platelets by anticoagulation protocol: Yes   Plan:  Give 3500 units bolus x 1 Start heparin infusion at 1150 units/hr Check anti-Xa level in 6 hours and daily while on heparin Continue to monitor H&H and platelets  Jeromiah Ohalloran Poteet 09/17/2017,3:25 AM

## 2017-09-17 NOTE — ED Provider Notes (Signed)
Emergency Department Provider Note   I have reviewed the triage vital signs and the nursing notes.   HISTORY  Chief Complaint Abdominal Pain   HPI [name] is a 72 y.o. female with PMH of with PMH of CAD, SBO, CHF, HLD, and DM since to the emergency department for evaluation of acute onset suprapubic and right lower quadrant abdominal pain with associated nausea, vomiting, diarrhea.  Symptoms began this morning.  Vomiting and diarrhea are nonbloody.  She has had 2-3 episodes of vomiting at home with 1-2 since arrival in the emergency department.  She continues to pass flatus.  She denies any sick contacts at home or recent travel.  No fevers or chills.  Denies chest pain or dyspnea.  Patient has had multiple episodes of small bowel obstruction in the past.    Past Medical History:  Diagnosis Date  . Abdominal discomfort    Chronic N/V/D. Presumptive dx Crohn's dx per elevated p ANCA. Failed Entocort and Pentasa. Sep 2003 - ileocolectomy c anastomosis per Dr Deon Pilling 2/2 adhesions - path was hegative for Crohns. EGD, Sm bowel follow through (11/03), and an eteroclysis (10/03) were unrevealing. Cuases hypomag and hypocalcemia.  . Adnexal mass 8/03   s/p lap BSO (R ovarian fibroma) & lysis of adhesions  . Allergy    Seasonal  . Anemia    Multifactorial. Baseline HgB 10-11 ish. B12 def - 150 in 3/10. Fe Def - ferritin 35 3/10. Both are being repleted.  . Breast cancer ([place]) 03/16/13   right, 5 o'clock  . CAD (coronary artery disease) 1996   1996 - PTCA and angioplasty diagonal branch. 2000 - Rotoblator & angiopllasty of diagonal. 2006 - subendocardial AMI, DES to proximal LAD.Marland Kitchen Also had 90% stenosis in distal apical LAD. EF 55 with apical hypokinesis. Indefinite ASA and Plavix.  . CHF (congestive heart failure) ([place])   . Chronic kidney disease    Chronic renal insuff baseline Cr 1.2 - 1.4 ish.  . Chronic pain    CT 10/10 = Spinal stenosis L2 - S1.  . Diabetes mellitus    Insulin dependent  . Hx of radiation therapy 06/02/13- 07/16/13   right rbeast 4500 cGy 25 sessions, right breast boost 1600 cGy in 8 sessions  . Hyperlipidemia    Managed with both a statin and Welchol. Welchol stopped 2014 2/2 cost and started on fenofibrate   . Hypertension    2006 B renal arteries patent. 2003 MRA - no RAS. 2003 pheo W/U Dr Hassell Done reportedly negative.  . Hypoxia 07/23/2017  . Lupus ([place])   . Personal history of radiation therapy   . RBBB   . Stroke [facility])    Incidental finding MRI 2002 L lacunar infarct  . Wears dentures    top    Patient Active Problem List   Diagnosis Date Noted  . Renal vein thrombosis ([place]) 09/17/2017  . Hoarse voice quality 09/09/2017  . Low back pain without sciatica 09/09/2017  . Labile blood glucose   . Sleep disturbance   . Leukocytosis   . Essential hypertension   . Urinary frequency   . Type 2 diabetes mellitus with peripheral neuropathy (HCC)   . Benign essential HTN   . Stage 2 chronic kidney disease   . Dysphagia   . Debility   . Encephalopathy   . Pressure injury of skin 08/04/2017  . Pneumonia of left lung due to infectious organism   . Diffuse pulmonary alveolar hemorrhage   . SOB (shortness  of breath) 10/11/2016  . Hyperparathyroidism, secondary renal ([place]) 05/02/2016  . Gout 04/11/2015  . Diabetic neuropathy associated with type 2 diabetes mellitus ([place]) 01/06/2015  . SBO (small bowel obstruction) ([place]) 09/15/2013  . Breast cancer of lower-inner quadrant of right female breast ([place]) 03/18/2013  . Chronic venous insufficiency 01/06/2013  . Health care maintenance 05/08/2011  . Diabetes mellitus type 2 in obese ([place]) 05/03/2010  . Seasonal allergies 05/03/2010  . Labile hypertension   . CAD (coronary artery disease)   . Abdominal discomfort   . Anemia in chronic kidney disease   . History of stroke without residual deficits   . Hyperlipidemia associated with type 2 diabetes mellitus ([place])   . Chronic pain   .  Chronic kidney disease, stage III (moderate) (HCC)   . Adnexal mass     Past Surgical History:  Procedure Laterality Date  . ABDOMINAL HYSTERECTOMY    . BILATERAL SALPINGOOPHORECTOMY  8/03   Lap BSO (R ovarian fibroma) and adhesion lysis  . BOWEL RESECTION  2003   ileocolectomy with anastomosis 2/2 adhesions  . BREAST BIOPSY    . BREAST LUMPECTOMY Right   . BREAST LUMPECTOMY WITH NEEDLE LOCALIZATION AND AXILLARY SENTINEL LYMPH NODE BX Right 04/22/2013   Procedure: BREAST LUMPECTOMY WITH NEEDLE LOCALIZATION AND AXILLARY SENTINEL LYMPH NODE BX;  Surgeon: Stark Klein, MD;  Location: [facility];  Service: General;  Laterality: Right;  . CARDIAC CATHETERIZATION     2 stents  . CHOLECYSTECTOMY    . HEMICOLECTOMY     R sided hemicolectomy  . HERNIA REPAIR     Ventral hernia repair  . PTCA  4/06    Allergies Percocet [oxycodone-acetaminophen]; Diazepam; Haloperidol lactate; Lorazepam; Morphine sulfate; Propoxyphene hcl; and Tramadol hcl  Family History  Problem Relation Age of Onset  . Pancreatic cancer Brother 24  . Cancer Mother   . Breast cancer Mother 25  . Lung cancer Maternal Aunt   . Breast cancer Maternal Aunt   . Prostate cancer Maternal Uncle   . Heart attack Maternal Grandmother   . Breast cancer Maternal Grandmother   . Kidney failure Maternal Aunt   . Hypertension Daughter     Social History Social History   Tobacco Use  . Smoking status: Never Smoker  . Smokeless tobacco: Never Used  Substance Use Topics  . Alcohol use: No    Alcohol/week: 0.0 oz  . Drug use: No    Review of Systems  Constitutional: No fever/chills Eyes: No visual changes. ENT: No sore throat. Cardiovascular: Denies chest pain. Respiratory: Denies shortness of breath. Gastrointestinal: Positive RLQ abdominal pain. Positive nausea, vomiting, and diarrhea.  No constipation. Genitourinary: Negative for dysuria. Musculoskeletal: Negative for back pain. Skin: Negative for  rash. Neurological: Negative for headaches, focal weakness or numbness.  10-point ROS otherwise negative.  ____________________________________________   PHYSICAL EXAM:  VITAL SIGNS: ED Triage Vitals  Enc Vitals Group     BP 09/16/17 2203 137/69     Pulse Rate 09/16/17 2203 93     Resp 09/16/17 2203 20     Temp 09/16/17 2203 98.1 F (36.7 C)     Temp Source 09/16/17 2203 Oral     SpO2 09/16/17 2203 99 %     Weight 09/16/17 2202 173 lb (78.5 kg)     Height 09/16/17 2202 5' 4"  (1.626 m)     Pain Score 09/16/17 2202 10   Constitutional: Alert and oriented. Well appearing and in no acute distress. Eyes: Conjunctivae  are normal.  Head: Atraumatic. Nose: No congestion/rhinnorhea. Mouth/Throat: Mucous membranes are moist.  Oropharynx non-erythematous. Neck: No stridor.   Cardiovascular: Normal rate, regular rhythm. Good peripheral circulation. Grossly normal heart sounds.   Respiratory: Normal respiratory effort.  No retractions. Lungs CTAB. Gastrointestinal: Soft with mild/moderate RLQ pain. No rebound or guarding. Some discomfort noted suprapubic region. No distention.  Musculoskeletal: No lower extremity tenderness nor edema. No gross deformities of extremities. Neurologic:  Normal speech and language. No gross focal neurologic deficits are appreciated.  Skin:  Skin is warm, dry and intact. No rash noted.  ____________________________________________   LABS (all labs ordered are listed, but only abnormal results are displayed)  Labs Reviewed  COMPREHENSIVE METABOLIC PANEL - Abnormal; Notable for the following components:      Result Value   Glucose, Bld 202 (*)    BUN 25 (*)    Creatinine, Ser 1.48 (*)    GFR calc non Af Amer 35 (*)    GFR calc Af Amer 40 (*)    All other components within normal limits  CBC - Abnormal; Notable for the following components:   WBC 13.8 (*)    RBC 3.77 (*)    Hemoglobin 10.0 (*)    HCT 31.9 (*)    RDW 17.6 (*)    All other  components within normal limits  URINALYSIS, ROUTINE W REFLEX MICROSCOPIC - Abnormal; Notable for the following components:   Protein, ur 100 (*)    All other components within normal limits  BASIC METABOLIC PANEL - Abnormal; Notable for the following components:   Glucose, Bld 209 (*)    Creatinine, Ser 1.26 (*)    GFR calc non Af Amer 42 (*)    GFR calc Af Amer 49 (*)    All other components within normal limits  CBC - Abnormal; Notable for the following components:   WBC 12.2 (*)    RBC 3.66 (*)    Hemoglobin 9.5 (*)    HCT 31.1 (*)    RDW 17.5 (*)    All other components within normal limits  LIPASE, BLOOD  HEPARIN LEVEL (UNFRACTIONATED)   ____________________________________________  RADIOLOGY  Ct Abdomen Pelvis W Contrast  Result Date: 09/17/2017 CLINICAL DATA:  72 year old female with abdominal pain. EXAM: CT ABDOMEN AND PELVIS WITH CONTRAST TECHNIQUE: Multidetector CT imaging of the abdomen and pelvis was performed using the standard protocol following bolus administration of intravenous contrast. CONTRAST:  21m OMNIPAQUE IOHEXOL 300 MG/ML  SOLN COMPARISON:  CT of the abdomen pelvis dated 07/19/2016 FINDINGS: Lower chest: The visualized lung bases are clear. There is mild cardiomegaly. Coronary vascular calcification noted. Small pericardial effusion. No intra-abdominal free air or free fluid. Hepatobiliary: Cholecystectomy. There is mild intrahepatic biliary ductal dilatation, likely post cholecystectomy. The liver is unremarkable. No retained calcified stone noted in the central CBD. Pancreas: Mildly dilated pancreatic duct measuring approximately 4 mm diameter. This finding is similar to prior CT and may be related to sequela of chronic pancreatitis. There is a 9 x 11 mm apparent soft tissue density in the region of the ampulla (series 3 image 32 and coronal series 6, image 51). This may be artifactual and related to volume averaging artifact with adjacent soft tissues. An  ampullary lesion is not excluded. Further evaluation with MRCP is recommended. No active inflammatory changes. Spleen: Normal in size without focal abnormality. Adrenals/Urinary Tract: The adrenal glands are unremarkable. There is a 1.5 cm right renal interpolar cyst. Additional subcentimeter renal hypodense lesions are too  small to characterize. There is no hydronephrosis on either side. The visualized ureters and urinary bladder appear unremarkable. Stomach/Bowel: There is a small hiatal hernia. There is postsurgical changes of partial bowel resection with ileocecal anastomosis in the right lower quadrant. There is dilatation of multiple fluid-filled loops of small bowel in the lower abdomen measuring up to 3.8 cm in diameter. There is a transition zone in the pelvis superior to the bladder (series 3 image 71 and coronal series 6, image 56), likely secondary to adhesions. There is abutment of multiple loops of small bowel to the anterior peritoneal wall compatible with adhesions. There is mild inflammatory changes of these loops of bowel. The appendix is surgically absent. Vascular/Lymphatic: There is a large left renal vein thrombus extending to the junction of the IVC. No definite clot identified within the IVC. The etiology of the clot is not excluded but may be related to underlying systemic/metabolic disease. However, a tumor thrombus is not entirely excluded. No definite suspicious mass noted in the left kidney by CT. However, further evaluation with MRI or CT urography on a nonemergent basis is recommended. There is advanced aortoiliac atherosclerotic disease. The IVC appears unremarkable. No portal venous gas. There is no adenopathy. Reproductive: Hysterectomy.  No pelvic mass. Other: Midline vertical anterior pelvic wall incisional scar. Mild diffuse subcutaneous edema. Focal area of haziness in the omentum (series 3, image 44) likely represents an area of omental contusion or infarct. No fluid  collection. Musculoskeletal: Degenerative changes of the spine. No acute osseous pathology. There is thickening of the right breast skin with infiltration of the fat in the right breast tissue. A 1.9 x 1.5 cm nodular density associated with multiple adjacent surgical clips noted in the retroareolar region. Clinical correlation is recommended. No fluid collection. IMPRESSION: 1. Small-bowel obstruction with transition in the pelvis likely secondary to adhesions. 2. Left renal vein thrombus of indeterminate etiology. Although this may represent a bland thrombus related to underlying systemic/metabolic condition, tumor thrombus is not entirely excluded. Further evaluation with MRI or CT urography on a nonemergent basis is recommended to exclude an occult renal neoplasm. No definite extension of tumor into the IVC on this CT. 3. Mildly dilated main pancreatic duct similar to prior CT, likely chronic and sequela of chronic pancreatitis. An ill-defined nodular soft tissue density in the region of the ampulla is likely artifactual and related to volume-averaging artifact. An ampullary lesion is not entirely excluded. Further evaluation with MRCP on a nonemergent basis is recommended. 4.  Aortic Atherosclerosis (ICD10-I70.0). These results were called by telephone at the time of interpretation on 09/17/2017 at 2:00 am to Dr. Nanda Quinton , who verbally acknowledged these results. Electronically Signed   By: Anner Crete M.D.   On: 09/17/2017 02:04    ____________________________________________   PROCEDURES  Procedure(s) performed:   Procedures  None ____________________________________________   INITIAL IMPRESSION / ASSESSMENT AND PLAN / ED COURSE  Pertinent labs & imaging results that were available during my care of the patient were reviewed by me and considered in my medical decision making (see chart for details).  Patient presents to the emergency department for evaluation of nausea, vomiting,  diarrhea in the setting of suprapubic and right lower quadrant abdominal pain.  She has similar pain in the past and notes a history of small bowel obstruction.  Considering partial small bowel obstruction, colitis.  Doubt pelvic etiology.  Patient afebrile.  Plan for CT abdomen pelvis along with pain and nausea control. [place] IV fluids.  Spoke with Radiology regarding CT read. Plan to place NG tube with active vomiting in the ED waiting room. Suspect partial SBO given BMs today. Also note renal vein thrombus. Will defer anticoagulation decision to primary team. Patient tells me that she was on ASA and Plavix but this was discontinued for a reason not known to the patient. Denies any specific history of ICH or GI bleeding.   Discussed patient's case with Hospitalist, Dr. Myna Hidalgo to request admission. Patient and family (if present) updated with plan. Care transferred to Hospitalist service.  I reviewed all nursing notes, vitals, pertinent old records, EKGs, labs, imaging (as available).   ____________________________________________  FINAL CLINICAL IMPRESSION(S) / ED DIAGNOSES  Final diagnoses:  SBO (small bowel obstruction) (HCC)  Renal vein thrombosis (HCC)  Nausea vomiting and diarrhea  Right lower quadrant abdominal pain     MEDICATIONS GIVEN DURING THIS VISIT:  Medications  0.9 %  sodium chloride infusion ( Intravenous New Bag/Given 09/17/17 0341)  acetaminophen (TYLENOL) tablet 650 mg (has no administration in time range)    Or  acetaminophen (TYLENOL) suppository 650 mg (has no administration in time range)  ondansetron (ZOFRAN) tablet 4 mg (has no administration in time range)    Or  ondansetron (ZOFRAN) injection 4 mg (has no administration in time range)  fentaNYL (SUBLIMAZE) injection 25-50 mcg (50 mcg Intravenous Given 09/17/17 0313)  promethazine (PHENERGAN) injection 12.5 mg (has no administration in time range)  hydrALAZINE (APRESOLINE) injection 10 mg (has no  administration in time range)  heparin ADULT infusion 100 units/mL (25000 units/273m sodium chloride 0.45%) (1,150 Units/hr Intravenous New Bag/Given 09/17/17 0341)  famotidine (PEPCID) IVPB 20 mg premix (has no administration in time range)  pantoprazole (PROTONIX) injection 40 mg (has no administration in time range)  insulin aspart (novoLOG) injection 0-9 Units (3 Units Subcutaneous Given 09/17/17 0435)  ondansetron (ZOFRAN-ODT) disintegrating tablet 4 mg (4 mg Oral Given 09/16/17 2210)  ondansetron (ZOFRAN) injection 4 mg (4 mg Intravenous Given 09/17/17 0054)  sodium chloride 0.9 % bolus 500 mL (0 mLs Intravenous Stopped 09/17/17 0155)  iohexol (OMNIPAQUE) 300 MG/ML solution 100 mL (75 mLs Intravenous Contrast Given 09/17/17 0119)  heparin bolus via infusion 3,500 Units (3,500 Units Intravenous Bolus from Bag 09/17/17 0346)    Note:  This document was prepared using Dragon voice recognition software and may include unintentional dictation errors.  JNanda Quinton MD Emergency Medicine    Winnie Umali, JWonda Olds MD 09/17/17 [phone]

## 2017-09-17 NOTE — Progress Notes (Signed)
[facility] for heparin Indication: DVT  Allergies  Allergen Reactions  . Percocet [Oxycodone-Acetaminophen] Other (See Comments)    headaches  . Diazepam Other (See Comments)    REACTION: Agitation  . Haloperidol Lactate Nausea And Vomiting  . Lorazepam Nausea Only  . Morphine Sulfate Other (See Comments)    REACTION: Agitation  . Propoxyphene Hcl Nausea And Vomiting  . Tramadol Hcl Nausea And Vomiting    Patient Measurements: Height: 5' 4"  (162.6 cm) Weight: 173 lb (78.5 kg) IBW/kg (Calculated) : 54.7 Heparin Dosing Weight: 71.4 kg  Vital Signs: Temp: 98.6 F (37 C) (07/02 1337) Temp Source: Oral (07/02 1337) BP: 160/70 (07/02 1337) Pulse Rate: 76 (07/02 1337)  Labs: Recent Labs    09/16/17 2216 09/17/17 0323 09/17/17 1015 09/17/17 1741  HGB 10.0* 9.5*  --   --   HCT 31.9* 31.1*  --   --   PLT 316 313  --   --   HEPARINUNFRC  --   --  0.35 <0.10*  CREATININE 1.48* 1.26*  --   --     Assessment: 72 yo lady with acute renal vein thrombosis. Heparin was started early this morning.  SCr 1.26, cbc ok.  Heparin level is  <0.1 but RN reports heparin was off x 2 hrs while pt in MRI. HL drawn right after pt returned from MRI, thus unable to use this level.  Heparin restarted at 17:00.  No bleeding noted per RN.   Goal of Therapy:  Heparin level 0.3-0.7 units/ml Monitor platelets by anticoagulation protocol: Yes   Plan:  -Continue heparin at 1150 units/hr -Check a 6h HL @23 :00 -Daily HL, CBC -F/u plan for long-term anticoagulation  Nicole Cella, RPh Clinical Pharmacist Please check AMION for all [facility] numbers After 10 pm, call main pharmacy [phone] 09/17/2017,7:18 PM

## 2017-09-18 DIAGNOSIS — I10 Essential (primary) hypertension: Secondary | ICD-10-CM

## 2017-09-18 DIAGNOSIS — N184 Chronic kidney disease, stage 4 (severe): Secondary | ICD-10-CM

## 2017-09-18 DIAGNOSIS — I823 Embolism and thrombosis of renal vein: Secondary | ICD-10-CM

## 2017-09-18 DIAGNOSIS — N183 Chronic kidney disease, stage 3 (moderate): Secondary | ICD-10-CM

## 2017-09-18 DIAGNOSIS — E1169 Type 2 diabetes mellitus with other specified complication: Secondary | ICD-10-CM

## 2017-09-18 DIAGNOSIS — C50311 Malignant neoplasm of lower-inner quadrant of right female breast: Secondary | ICD-10-CM

## 2017-09-18 DIAGNOSIS — Z8673 Personal history of transient ischemic attack (TIA), and cerebral infarction without residual deficits: Secondary | ICD-10-CM

## 2017-09-18 DIAGNOSIS — Z170 Estrogen receptor positive status [ER+]: Secondary | ICD-10-CM

## 2017-09-18 DIAGNOSIS — E669 Obesity, unspecified: Secondary | ICD-10-CM

## 2017-09-18 DIAGNOSIS — I2510 Atherosclerotic heart disease of native coronary artery without angina pectoris: Secondary | ICD-10-CM

## 2017-09-18 DIAGNOSIS — D631 Anemia in chronic kidney disease: Secondary | ICD-10-CM

## 2017-09-18 DIAGNOSIS — K56609 Unspecified intestinal obstruction, unspecified as to partial versus complete obstruction: Secondary | ICD-10-CM

## 2017-09-18 LAB — BASIC METABOLIC PANEL
Anion gap: 9 (ref 5–15)
BUN: 13 mg/dL (ref 8–23)
CALCIUM: 9.3 mg/dL (ref 8.9–10.3)
CO2: 23 mmol/L (ref 22–32)
CREATININE: 1.12 mg/dL — AB (ref 0.44–1.00)
Chloride: 108 mmol/L (ref 98–111)
GFR calc Af Amer: 56 mL/min — ABNORMAL LOW (ref >60)
GFR, EST NON AFRICAN AMERICAN: 49 mL/min — AB (ref >60)
Glucose, Bld: 113 mg/dL — ABNORMAL HIGH (ref 70–99)
Potassium: 3.3 mmol/L — ABNORMAL LOW (ref 3.5–5.1)
SODIUM: 140 mmol/L (ref 135–145)

## 2017-09-18 LAB — CBC
HCT: 31.5 % — ABNORMAL LOW (ref 36.0–46.0)
Hemoglobin: 9.8 g/dL — ABNORMAL LOW (ref 12.0–15.0)
MCH: 26.2 pg (ref 26.0–34.0)
MCHC: 31.1 g/dL (ref 30.0–36.0)
MCV: 84.2 fL (ref 78.0–100.0)
PLATELETS: 385 10*3/uL (ref 150–400)
RBC: 3.74 MIL/uL — ABNORMAL LOW (ref 3.87–5.11)
RDW: 17.5 % — AB (ref 11.5–15.5)
WBC: 10.1 10*3/uL (ref 4.0–10.5)

## 2017-09-18 LAB — GLUCOSE, CAPILLARY
GLUCOSE-CAPILLARY: 103 mg/dL — AB (ref 70–99)
Glucose-Capillary: 102 mg/dL — ABNORMAL HIGH (ref 70–99)
Glucose-Capillary: 120 mg/dL — ABNORMAL HIGH (ref 70–99)
Glucose-Capillary: 168 mg/dL — ABNORMAL HIGH (ref 70–99)

## 2017-09-18 LAB — HEPARIN LEVEL (UNFRACTIONATED): Heparin Unfractionated: 0.45 IU/mL (ref 0.30–0.70)

## 2017-09-18 MED ORDER — RIVAROXABAN 20 MG PO TABS: 20.0000 mg | ORAL_TABLET | Freq: Every day | ORAL | 4 refills | Status: DC

## 2017-09-18 MED ORDER — RIVAROXABAN 15 MG PO TABS: 15.0000 mg | ORAL_TABLET | Freq: Two times a day (BID) | ORAL | 0 refills | Status: DC

## 2017-09-18 MED ORDER — RIVAROXABAN 15 MG PO TABS
15.0000 mg | ORAL_TABLET | Freq: Two times a day (BID) | ORAL | Status: DC
  Administered 2017-09-18: 15 mg via ORAL
  Filled 2017-09-18: qty 1

## 2017-09-18 MED ORDER — RIVAROXABAN (XARELTO) VTE STARTER PACK (15 & 20 MG): ORAL_TABLET | ORAL | 0 refills | Status: DC

## 2017-09-18 NOTE — Progress Notes (Signed)
Discharge instructions reviewed with Pt and her family member.. Pt met with Pharmacy and education provided to pt. Pt discharged home with [facility] Pt has her prescriptions Pt left via w/c to front with family member.

## 2017-09-18 NOTE — Progress Notes (Addendum)
[facility] for Heparin Indication: Renal vein thrombosis  Allergies  Allergen Reactions  . Percocet [Oxycodone-Acetaminophen] Other (See Comments)    headaches  . Diazepam Other (See Comments)    REACTION: Agitation  . Haloperidol Lactate Nausea And Vomiting  . Lorazepam Nausea Only  . Morphine Sulfate Other (See Comments)    REACTION: Agitation  . Propoxyphene Hcl Nausea And Vomiting  . Tramadol Hcl Nausea And Vomiting    Patient Measurements: Height: 5' 4"  (162.6 cm) Weight: 173 lb (78.5 kg) IBW/kg (Calculated) : 54.7 Heparin Dosing Weight: 71.4 kg  Vital Signs: Temp: 98.8 F (37.1 C) (07/03 0751) Temp Source: Oral (07/03 0751) BP: 143/79 (07/03 0751) Pulse Rate: 92 (07/03 0751)  Labs: Recent Labs    09/16/17 2216 09/17/17 0323  09/17/17 1741 09/17/17 2243 09/18/17 0710  HGB 10.0* 9.5*  --   --   --  9.8*  HCT 31.9* 31.1*  --   --   --  31.5*  PLT 316 313  --   --   --  385  HEPARINUNFRC  --   --    < > <0.10* 0.14* 0.45  CREATININE 1.48* 1.26*  --   --   --  1.12*   < > = values in this interval not displayed.    Assessment: 72 yo female with acute renal vein thrombosis. Heparin was started early this morning. Hgb 9.5. Renal function ok.   Heparin level this AM is therapeutic at 0.45 on rate of 1300 units/hr.  No bleeding reported. H/H is stable. Platelets are within normal limits.   Goal of Therapy:  Heparin level 0.3-0.7 units/ml Monitor platelets by anticoagulation protocol: Yes   Plan:  Continue heparin at 1300 units/hr Confirmatory heparin level in 8 hours.  Daily Heparin level and CBC while on therapy.   Sloan Leiter, PharmD, BCPS, BCCCP Clinical Pharmacist Clinical phone 09/18/2017 until 3:30PM [phone] After hours, please call [phone] 09/18/2017, 9:39 AM

## 2017-09-18 NOTE — Discharge Instructions (Signed)
Information on my medicine - XARELTO (rivaroxaban)  This medication education was reviewed with me or my healthcare representative as part of my discharge preparation.  The pharmacist that spoke with me during my hospital stay was:  Arman Bogus, [place]? Xarelto was prescribed to treat blood clots that may have been found in the veins of your legs (deep vein thrombosis) or in your lungs (pulmonary embolism) and to reduce the risk of them occurring again.  What do you need to know about Xarelto? The starting dose is one 15 mg tablet taken TWICE daily with food for the FIRST 21 DAYS then on  10/09/17  the dose is changed to one 20 mg tablet taken ONCE A DAY with your evening meal.  DO NOT stop taking Xarelto without talking to the health care provider who prescribed the medication.  Refill your prescription for 20 mg tablets before you run out.  After discharge, you should have regular check-up appointments with your healthcare provider that is prescribing your Xarelto.  In the future your dose may need to be changed if your kidney function changes by a significant amount.  What do you do if you miss a dose? If you are taking Xarelto TWICE DAILY and you miss a dose, take it as soon as you remember. You may take two 15 mg tablets (total 30 mg) at the same time then resume your regularly scheduled 15 mg twice daily the next day.  If you are taking Xarelto ONCE DAILY and you miss a dose, take it as soon as you remember on the same day then continue your regularly scheduled once daily regimen the next day. Do not take two doses of Xarelto at the same time.   Important Safety Information Xarelto is a blood thinner medicine that can cause bleeding. You should call your healthcare provider right away if you experience any of the following: ? Bleeding from an injury or your nose that does not stop. ? Unusual colored urine (red or dark brown) or unusual  colored stools (red or black). ? Unusual bruising for unknown reasons. ? A serious fall or if you hit your head (even if there is no bleeding).  Some medicines may interact with Xarelto and might increase your risk of bleeding while on Xarelto. To help avoid this, consult your healthcare provider or pharmacist prior to using any new prescription or non-prescription medications, including herbals, vitamins, non-steroidal anti-inflammatory drugs (NSAIDs) and supplements.  This website has more information on Xarelto: [email or web address]

## 2017-09-18 NOTE — Care Management Note (Addendum)
Case Management Note  Patient Details  Name: [name] MRN: [id number] Date of Birth: Dec 08, 1945  Subjective/Objective:                    Action/Plan:  30 day free card given and explained to patient. Entered benefits check for cost after 30 days free.   Patient voiced understanding.  Spoke with Gerald Stabs at [facility] patient's co pay for Xarelto / month $8.50 . Patient aware. Text paged MD  Expected Discharge Date:                  Expected Discharge Plan:  [place]  In-House Referral:     Discharge planning Services  CM Consult  Post Acute Care Choice:  Home Health Choice offered to:  Patient, Spouse  DME Arranged:  N/A DME Agency:  NA  HH Arranged:  Social Work, OT, PT, RN [place] Agency:  [facility]  Status of Service:  In process, will continue to follow  If discussed at Long Length of Stay Meetings, dates discussed:    Additional Comments:  Marilu Favre, RN 09/18/2017, 2:26 PM

## 2017-09-18 NOTE — Discharge Summary (Signed)
Physician Discharge Summary  [name] JTT:[id number] DOB: 1946/01/09 DOA: 09/16/2017  PCP: Meredith Staggers, MD  Admit date: 09/16/2017 Discharge date: 09/18/2017  Admitted From: Home  Disposition:  Home   Recommendations for Outpatient Follow-up:  1. Follow up with PCP in 1 week 2. Please obtain CBC in one month on Xarelto 3. Please ensure follow up with Dr. Johnsie Cancel re: need to restart Plavix and with Dr. Burr Medico regarding duration of anticoagulation  Home Health: To resume with [facility]  Equipment/Devices: None  Discharge Condition: Good  CODE STATUS: Full Diet recommendation: Soft  Brief/Interim Summary: [name] is a 72 y.o. F with HTN, CAD, history of breast cancer in remission on Arimidex, former SBO and recent multilobar pneumonia with respiratory failure, PEA arrest in May 2019 admitted for 1 day of generalized abdominal pain with nausea and vomiting.  In the emergency room, CT scan done noting early bowel obstruction and also incidentally noted left renal vein thrombosis as well as enlarged pancreatic cystic duct with questionable structure.     Discharge Diagnoses:  Small bowel obstruction The patient had BM in the hospital.  She was advanced to a clear diet and tolerated this without pain or vomiting.  She was eager to go home.  She recovered considerably faster than expected.   Renal vein thrombosis This was an incidental finding.  Features of the CT suggest this is a nonocclusive chronic thrombosis.  She has had no pain.  With regard to renal function, she has long-standing CKD,an episode of unexplained AoCKI in 2017 and then again with her recent pneumonia and respiratory failure, but currently no kidney injury.  She was on Arimidex, but my understanding is that this has low thrombogenic potential.  There is no other obvious precipitating factor at this time.   Started on heparin, transitioned to Xarelto at DVT dosage for planned six months.  I recommended she see Dr.  Burr Medico with whom she is established for advice re: restarting Arimidex and also second opinion re: duration of AC.    Pancreatic cyst MRCP showed signs of chronic pancreatitis, no findings to suggest mass or malignancy.  Patient without history or symptoms of chronic pancreatitis.  Coronary artery disease  Chronic anemia of renal disease Stable relative to baseline.  CKD stage III Cr stable relative to baseline.  Diabetes No change to home regimen.  Hypertension Well controlled           Discharge Instructions  Discharge Instructions    Diet - low sodium heart healthy   Complete by:  As directed    Discharge instructions   Complete by:  As directed    From Dr. Loleta Books: You were admitted with a small bowel obstruction. While you were here, we accidentally found out that you have a blood clot in your kidney vein.   For the small bowel obstruction, we usually just let the bowels rest and give it time to resolve itself.  In your case, that happened very quickly.   Eat soft foods for 2-3 days, then advance your diet as able.  For the blood clot, take Xarelto for six months. To start:      -Take rivaroxaban/Xarelto 15 mg twice daily for 3 weeks     -On day 21, switch to Xarelto 20 mg once daily     -Continue Xarelto 20 mg once daily for 6 months then stop     -I recommend you follow up with Dr. Burr Medico to discuss if six months is  adequate and if it is safe to stop     -When you do see Dr. Burr Medico, discuss with her whether the blood clot should influence whether you start taking Arimidex again.  Be alert for signs of bleeding (this would look like blood bowel movements or like black and tarry bowel movements)  Do not take aspirin or Plavix while taking Xarelto, unless instructed by Dr. Johnsie Cancel (discuss this with him).  Resume all your other previous medicines for blood pressure and diabetes.  Follow up with Dr. Delfina Redwood at your upcoming appointment.   Increase activity slowly    Complete by:  As directed      Allergies as of 09/18/2017      Reactions   Percocet [oxycodone-acetaminophen] Other (See Comments)   headaches   Diazepam Other (See Comments)   REACTION: Agitation   Haloperidol Lactate Nausea And Vomiting   Lorazepam Nausea Only   Morphine Sulfate Other (See Comments)   REACTION: Agitation   Propoxyphene Hcl Nausea And Vomiting   Tramadol Hcl Nausea And Vomiting      Medication List    STOP taking these medications   anastrozole 1 MG tablet Commonly known as:  ARIMIDEX   aspirin EC 81 MG tablet   losartan 100 MG tablet Commonly known as:  COZAAR     TAKE these medications   acetaminophen 325 MG tablet Commonly known as:  TYLENOL Take 650 mg by mouth every 6 (six) hours as needed for mild pain.   amLODipine 10 MG tablet Commonly known as:  NORVASC Take 1 tablet (10 mg total) by mouth daily.   atorvastatin 40 MG tablet Commonly known as:  LIPITOR Take 1 tablet (40 mg total) by mouth at bedtime.   carvedilol 25 MG tablet Commonly known as:  COREG Take 1 tablet (25 mg total) by mouth 2 (two) times daily with a meal. What changed:  how much to take   desloratadine 5 MG tablet Commonly known as:  CLARINEX Take 5 mg by mouth at bedtime as needed (allergies). Reported on 04/11/2015   fenofibrate 54 MG tablet Take 54 mg by mouth daily.   ferrous fumarate 325 (106 Fe) MG Tabs tablet Commonly known as:  HEMOCYTE - 106 mg FE Take 1 tablet (106 mg of iron total) by mouth daily. What changed:  when to take this   furosemide 40 MG tablet Commonly known as:  LASIX Take 1 tablet (40 mg total) by mouth daily as needed for edema.   gabapentin 100 MG capsule Commonly known as:  NEURONTIN Take 100 mg by mouth at bedtime.   linagliptin 5 MG Tabs tablet Commonly known as:  TRADJENTA Take 5 mg by mouth daily.   loperamide 2 MG capsule Commonly known as:  IMODIUM TAKE ONE CAPSULE BY MOUTH ONCE DAILY AS NEEDED FOR DIARRHEA OR LOOSE  STOOLS   Melatonin 3 MG Tabs Take 0.5 tablets (1.5 mg total) by mouth at bedtime.   nitroGLYCERIN 0.4 MG SL tablet Commonly known as:  NITROSTAT Place 1 tablet (0.4 mg total) under the tongue every 5 (five) minutes as needed for chest pain.   pantoprazole 40 MG tablet Commonly known as:  PROTONIX Take 1 tablet (40 mg total) by mouth daily.   potassium chloride SA 20 MEQ tablet Commonly known as:  K-DUR,KLOR-CON Take 1 tablet (20 mEq total) by mouth daily as needed (when taking lasix).   rivaroxaban 20 MG Tabs tablet Commonly known as:  XARELTO Take 1 tablet (20 mg total) by  mouth daily with supper.   Rivaroxaban 15 MG Tabs tablet Commonly known as:  XARELTO Take 1 tablet (15 mg total) by mouth 2 (two) times daily with a meal.   ULORIC 40 MG tablet Generic drug:  febuxostat Take 1 tablet by mouth daily.      [place] Follow up.   Specialty:  Home Health Services Contact information: [place] [address] [phone]          Allergies  Allergen Reactions  . Percocet [Oxycodone-Acetaminophen] Other (See Comments)    headaches  . Diazepam Other (See Comments)    REACTION: Agitation  . Haloperidol Lactate Nausea And Vomiting  . Lorazepam Nausea Only  . Morphine Sulfate Other (See Comments)    REACTION: Agitation  . Propoxyphene Hcl Nausea And Vomiting  . Tramadol Hcl Nausea And Vomiting    Consultations:  None   Procedures/Studies: Dg Lumbar Spine Complete  Result Date: 09/11/2017 CLINICAL DATA:  Low back pain for 2 weeks after falling. EXAM: LUMBAR SPINE - COMPLETE 4+ VIEW COMPARISON:  07/19/2016 FINDINGS: There is no vertebral compression deformity. Stable alignment of the vertebral bodies. There is severe narrowing of the L3-4, L4-5, and L5-S1 discs. The vertebral bodies are noticeably sclerotic worrisome for metastatic disease. IMPRESSION: No acute bony injury. Bony  framework is sclerotic which may represent metastatic disease from breast cancer. Bone scan may be helpful. Electronically Signed   By: Marybelle Killings M.D.   On: 09/11/2017 09:37   Ct Abdomen Pelvis W Contrast  Result Date: 09/17/2017 CLINICAL DATA:  72 year old female with abdominal pain. EXAM: CT ABDOMEN AND PELVIS WITH CONTRAST TECHNIQUE: Multidetector CT imaging of the abdomen and pelvis was performed using the standard protocol following bolus administration of intravenous contrast. CONTRAST:  43m OMNIPAQUE IOHEXOL 300 MG/ML  SOLN COMPARISON:  CT of the abdomen pelvis dated 07/19/2016 FINDINGS: Lower chest: The visualized lung bases are clear. There is mild cardiomegaly. Coronary vascular calcification noted. Small pericardial effusion. No intra-abdominal free air or free fluid. Hepatobiliary: Cholecystectomy. There is mild intrahepatic biliary ductal dilatation, likely post cholecystectomy. The liver is unremarkable. No retained calcified stone noted in the central CBD. Pancreas: Mildly dilated pancreatic duct measuring approximately 4 mm diameter. This finding is similar to prior CT and may be related to sequela of chronic pancreatitis. There is a 9 x 11 mm apparent soft tissue density in the region of the ampulla (series 3 image 32 and coronal series 6, image 51). This may be artifactual and related to volume averaging artifact with adjacent soft tissues. An ampullary lesion is not excluded. Further evaluation with MRCP is recommended. No active inflammatory changes. Spleen: Normal in size without focal abnormality. Adrenals/Urinary Tract: The adrenal glands are unremarkable. There is a 1.5 cm right renal interpolar cyst. Additional subcentimeter renal hypodense lesions are too small to characterize. There is no hydronephrosis on either side. The visualized ureters and urinary bladder appear unremarkable. Stomach/Bowel: There is a small hiatal hernia. There is postsurgical changes of partial bowel  resection with ileocecal anastomosis in the right lower quadrant. There is dilatation of multiple fluid-filled loops of small bowel in the lower abdomen measuring up to 3.8 cm in diameter. There is a transition zone in the pelvis superior to the bladder (series 3 image 71 and coronal series 6, image 56), likely secondary to adhesions. There is abutment of multiple loops of small bowel to the anterior peritoneal wall compatible  with adhesions. There is mild inflammatory changes of these loops of bowel. The appendix is surgically absent. Vascular/Lymphatic: There is a large left renal vein thrombus extending to the junction of the IVC. No definite clot identified within the IVC. The etiology of the clot is not excluded but may be related to underlying systemic/metabolic disease. However, a tumor thrombus is not entirely excluded. No definite suspicious mass noted in the left kidney by CT. However, further evaluation with MRI or CT urography on a nonemergent basis is recommended. There is advanced aortoiliac atherosclerotic disease. The IVC appears unremarkable. No portal venous gas. There is no adenopathy. Reproductive: Hysterectomy.  No pelvic mass. Other: Midline vertical anterior pelvic wall incisional scar. Mild diffuse subcutaneous edema. Focal area of haziness in the omentum (series 3, image 44) likely represents an area of omental contusion or infarct. No fluid collection. Musculoskeletal: Degenerative changes of the spine. No acute osseous pathology. There is thickening of the right breast skin with infiltration of the fat in the right breast tissue. A 1.9 x 1.5 cm nodular density associated with multiple adjacent surgical clips noted in the retroareolar region. Clinical correlation is recommended. No fluid collection. IMPRESSION: 1. Small-bowel obstruction with transition in the pelvis likely secondary to adhesions. 2. Left renal vein thrombus of indeterminate etiology. Although this may represent a bland  thrombus related to underlying systemic/metabolic condition, tumor thrombus is not entirely excluded. Further evaluation with MRI or CT urography on a nonemergent basis is recommended to exclude an occult renal neoplasm. No definite extension of tumor into the IVC on this CT. 3. Mildly dilated main pancreatic duct similar to prior CT, likely chronic and sequela of chronic pancreatitis. An ill-defined nodular soft tissue density in the region of the ampulla is likely artifactual and related to volume-averaging artifact. An ampullary lesion is not entirely excluded. Further evaluation with MRCP on a nonemergent basis is recommended. 4.  Aortic Atherosclerosis (ICD10-I70.0). These results were called by telephone at the time of interpretation on 09/17/2017 at 2:00 am to Dr. Nanda Quinton , who verbally acknowledged these results. Electronically Signed   By: Anner Crete M.D.   On: 09/17/2017 02:04   Mr 3d Recon At Scanner  Result Date: 09/18/2017 CLINICAL DATA:  Inpatient. Generalized abdominal pain, nausea and vomiting. Small-bowel obstruction diagnosed on CT study from earlier today. Left renal vein thrombosis and questionable ampullary lesion on CT. History of right breast cancer. EXAM: MRI ABDOMEN WITHOUT AND WITH CONTRAST (INCLUDING MRCP) TECHNIQUE: Multiplanar multisequence MR imaging of the abdomen was performed both before and after the administration of intravenous contrast. Heavily T2-weighted images of the biliary and pancreatic ducts were obtained, and three-dimensional MRCP images were rendered by post processing. CONTRAST:  13m MULTIHANCE GADOBENATE DIMEGLUMINE 529 MG/ML IV SOLN COMPARISON:  09/17/2017 CT abdomen/pelvis. FINDINGS: Lower chest: No acute abnormality at the lung bases. Hepatobiliary: Normal liver size and configuration. No hepatic steatosis. Two 2 scattered subcentimeter simple liver cysts. No suspicious liver lesions. Cholecystectomy. Bile ducts are within normal post cholecystectomy  limits. Common bile duct diameter 8 mm. No choledocholithiasis. No biliary strictures. No biliary or ampullary mass. Pancreas: No pancreatic mass. Low T1 pancreatic parenchymal signal intensity. Mildly ectatic and slightly beaded appearance of the main and ventral pancreatic ducts. These findings are compatible with chronic pancreatitis. No peripancreatic fluid collections or edema. No pancreas divisum. Spleen: Normal size. No mass. Adrenals/Urinary Tract: Normal adrenals. No hydronephrosis. Asymmetric left perinephric edema. Minimally complex 2.5 cm Bosniak category 2 renal cyst in the posterior  interpolar right kidney with thin internal septation. There is a cystic 1.0 cm renal cortical lesion in lower left kidney (series 5/image 40) with small amount of layering hemorrhagic/proteinaceous material, without appreciable enhancement on subtraction sequences, compatible with a Bosniak category 2 hemorrhagic/proteinaceous renal cyst. Several subcentimeter simple renal cysts in both kidneys. No suspicious renal masses. Stomach/Bowel: Small hiatal hernia. Otherwise normal nondistended stomach. Visualized small and large bowel is normal caliber, with no bowel wall thickening. Vascular/Lymphatic: Atherosclerotic nonaneurysmal abdominal aorta. Nonocclusive bland left renal vein thrombus without extension into the IVC. Patent right renal vein, IVC, hepatic veins, portal veins and splenic vein. No pathologically enlarged lymph nodes in the abdomen. Other: No abdominal ascites or focal fluid collection. Musculoskeletal: No aggressive appearing focal osseous lesions. IMPRESSION: 1. Nonocclusive bland thrombosis of the left renal vein. Asymmetric left perinephric edema. 2. Bosniak category 1 and category 2 renal cysts. No suspicious renal masses. 3. Bile ducts are within normal post cholecystectomy limits. CBD diameter 8 mm. No choledocholithiasis. No biliary or ampullary mass. 4. Chronic pancreatitis. 5. Small hiatal hernia. 6.   Aortic Atherosclerosis (ICD10-I70.0). Electronically Signed   By: Ilona Sorrel M.D.   On: 09/18/2017 09:12   Dg Abd Portable 1v  Result Date: 09/17/2017 CLINICAL DATA:  Possible small bowel obstruction. Last bowel movement last night according to the patient. EXAM: PORTABLE ABDOMEN - 1 VIEW COMPARISON:  Abdominal and pelvic CT scan of September 17, 2017 FINDINGS: There are loops of mildly distended gas-filled small bowel to the left of midline. No free extraluminal gas collections are observed. There is some gas within loops of bowel in the rectum. There surgical clips in the right upper quadrant of the abdomen. The right hemidiaphragm is elevated. There are degenerative changes of the lower lumbar spine. IMPRESSION: Mildly distended gas-filled small bowel loops to the right of midline. This is slightly less conspicuous than on the CT scan of earlier today. No free extraluminal gas collections are observed and there is now some gas in the bowel loops located in the pelvis. Electronically Signed   By: David  [name] M.D.   On: 09/17/2017 11:14   Mr Abdomen Mrcp W Wo Contast  Result Date: 09/18/2017 CLINICAL DATA:  Inpatient. Generalized abdominal pain, nausea and vomiting. Small-bowel obstruction diagnosed on CT study from earlier today. Left renal vein thrombosis and questionable ampullary lesion on CT. History of right breast cancer. EXAM: MRI ABDOMEN WITHOUT AND WITH CONTRAST (INCLUDING MRCP) TECHNIQUE: Multiplanar multisequence MR imaging of the abdomen was performed both before and after the administration of intravenous contrast. Heavily T2-weighted images of the biliary and pancreatic ducts were obtained, and three-dimensional MRCP images were rendered by post processing. CONTRAST:  88m MULTIHANCE GADOBENATE DIMEGLUMINE 529 MG/ML IV SOLN COMPARISON:  09/17/2017 CT abdomen/pelvis. FINDINGS: Lower chest: No acute abnormality at the lung bases. Hepatobiliary: Normal liver size and configuration. No hepatic  steatosis. Two 2 scattered subcentimeter simple liver cysts. No suspicious liver lesions. Cholecystectomy. Bile ducts are within normal post cholecystectomy limits. Common bile duct diameter 8 mm. No choledocholithiasis. No biliary strictures. No biliary or ampullary mass. Pancreas: No pancreatic mass. Low T1 pancreatic parenchymal signal intensity. Mildly ectatic and slightly beaded appearance of the main and ventral pancreatic ducts. These findings are compatible with chronic pancreatitis. No peripancreatic fluid collections or edema. No pancreas divisum. Spleen: Normal size. No mass. Adrenals/Urinary Tract: Normal adrenals. No hydronephrosis. Asymmetric left perinephric edema. Minimally complex 2.5 cm Bosniak category 2 renal cyst in the posterior interpolar right kidney with thin  internal septation. There is a cystic 1.0 cm renal cortical lesion in lower left kidney (series 5/image 40) with small amount of layering hemorrhagic/proteinaceous material, without appreciable enhancement on subtraction sequences, compatible with a Bosniak category 2 hemorrhagic/proteinaceous renal cyst. Several subcentimeter simple renal cysts in both kidneys. No suspicious renal masses. Stomach/Bowel: Small hiatal hernia. Otherwise normal nondistended stomach. Visualized small and large bowel is normal caliber, with no bowel wall thickening. Vascular/Lymphatic: Atherosclerotic nonaneurysmal abdominal aorta. Nonocclusive bland left renal vein thrombus without extension into the IVC. Patent right renal vein, IVC, hepatic veins, portal veins and splenic vein. No pathologically enlarged lymph nodes in the abdomen. Other: No abdominal ascites or focal fluid collection. Musculoskeletal: No aggressive appearing focal osseous lesions. IMPRESSION: 1. Nonocclusive bland thrombosis of the left renal vein. Asymmetric left perinephric edema. 2. Bosniak category 1 and category 2 renal cysts. No suspicious renal masses. 3. Bile ducts are within  normal post cholecystectomy limits. CBD diameter 8 mm. No choledocholithiasis. No biliary or ampullary mass. 4. Chronic pancreatitis. 5. Small hiatal hernia. 6.  Aortic Atherosclerosis (ICD10-I70.0). Electronically Signed   By: Ilona Sorrel M.D.   On: 09/18/2017 09:12   Mr Mrv Abdomen W Wo Contrast  Result Date: 09/18/2017 CLINICAL DATA:  Abdominal pain. Left renal vein thrombus noted on CT. EXAM: MRA ABDOMEN WITH CONTRAST TECHNIQUE: Multiplanar, multiecho pulse sequences of the abdomen obtained with intravenous contrast. Angiographic images of abdomen were obtained using MRA technique with intravenous contrast. Examination performed concurrently with MRCP, dictated separately CONTRAST:  21m MULTIHANCE GADOBENATE DIMEGLUMINE 529 MG/ML IV SOLN COMPARISON:  CT 09/17/2017 and previous FINDINGS: VASCULAR Aorta: Normal caliber aorta without aneurysm, dissection, vasculitis or significant stenosis. Celiac: Patent without evidence of aneurysm, dissection, vasculitis or significant stenosis. SMA: Patent without evidence of aneurysm, dissection, vasculitis or significant stenosis. Renals: Both renal arteries are patent without evidence of aneurysm, dissection, vasculitis, fibromuscular dysplasia or significant stenosis. IMA: Patent without evidence of aneurysm, dissection, vasculitis or significant stenosis. Inflow: Visualized portions of common and external iliac arteries widely patent. Origin stenosis of the left internal iliac artery. Veins: Incompletely occlusive thrombus extends through the left renal vein to its confluence with the IVC. No evidence of caval extension of thrombus. No convincing evidence of thrombus enhancement after contrast administration. No associated left renal mass. Review of the MIP images confirms the above findings. NON-VASCULAR Lower chest: No acute abnormality. Hepatobiliary: See MRCP Pancreas: See MRCP Spleen: Normal in size without focal abnormality. Adrenals/Urinary Tract: Bilateral  renal cysts, largest 2.1 cm posteriorly on the right. The largest left cyst is 11 mm in the lower pole. No hydronephrosis. Mild inflammatory/edematous changes around the left kidney but symmetric renal parenchymal enhancement. Stomach/Bowel: Stomach nondistended. Visualized portions of small bowel and colon unremarkable. Lymphatic: No abdominal or mesenteric adenopathy. Other: No ascites. Musculoskeletal: No acute or significant osseous findings. IMPRESSION: 1. Nonocclusive left renal vein thrombus without extension into the IVC. 2. No evidence of associated renal mass or thrombus enhancement to suggest neoplastic etiology. 3. Origin stenosis of the left external internal iliac artery, of doubtful clinical significance. 4. See separate MRCP report for  additional findings. Electronically Signed   By: DLucrezia EuropeM.D.   On: 09/18/2017 08:50       Subjective: Feels well.  No flank pain, vomiting, abdominal pain.  Has had several loose stools today.  Discharge Exam: Vitals:   09/18/17 1227 09/18/17 1540  BP: 137/82 (!) 154/75  Pulse: 80 83  Resp:    Temp: 98.4 F (36.9  C) 98.2 F (36.8 C)  SpO2: 100% 100%   Vitals:   09/18/17 0449 09/18/17 0751 09/18/17 1227 09/18/17 1540  BP: (!) 151/64 (!) 143/79 137/82 (!) 154/75  Pulse: 75 92 80 83  Resp: 16 17    Temp: 98.4 F (36.9 C) 98.8 F (37.1 C) 98.4 F (36.9 C) 98.2 F (36.8 C)  TempSrc: Oral Oral Oral Oral  SpO2: 95% 100% 100% 100%  Weight:      Height:        General: Pt is alert, awake, not in acute distress Cardiovascular: RRR, S1/S2 +, no rubs, no gallops Respiratory: CTA bilaterally, no wheezing, no rhonchi Abdominal: Soft, NT, ND, bowel sounds + Extremities: no edema, no cyanosis    The results of significant diagnostics from this hospitalization (including imaging, microbiology, ancillary and laboratory) are listed below for reference.     Microbiology: No results found for this or any previous visit (from the past 240  hour(s)).   Labs: BNP (last 3 results) Recent Labs    10/11/16 1124 07/23/17 1214  BNP 56.5 660.6*   Basic Metabolic Panel: Recent Labs  Lab 09/16/17 2216 09/17/17 0323 09/18/17 0710  NA 139 137 140  K 3.9 3.6 3.3*  CL 106 104 108  CO2 25 24 23   GLUCOSE 202* 209* 113*  BUN 25* 23 13  CREATININE 1.48* 1.26* 1.12*  CALCIUM 10.0 9.5 9.3   Liver Function Tests: Recent Labs  Lab 09/16/17 2216  AST 18  ALT 16  ALKPHOS 44  BILITOT 0.4  PROT 7.9  ALBUMIN 3.8   Recent Labs  Lab 09/16/17 2216  LIPASE 40   No results for input(s): AMMONIA in the last 168 hours. CBC: Recent Labs  Lab 09/16/17 2216 09/17/17 0323 09/18/17 0710  WBC 13.8* 12.2* 10.1  HGB 10.0* 9.5* 9.8*  HCT 31.9* 31.1* 31.5*  MCV 84.6 85.0 84.2  PLT 316 313 385   Cardiac Enzymes: No results for input(s): CKTOTAL, CKMB, CKMBINDEX, TROPONINI in the last 168 hours. BNP: Invalid input(s): POCBNP CBG: Recent Labs  Lab 09/17/17 2057 09/18/17 0000 09/18/17 0446 09/18/17 0751 09/18/17 1225  GLUCAP 171* 103* 102* 120* 168*   D-Dimer No results for input(s): DDIMER in the last 72 hours. Hgb A1c No results for input(s): HGBA1C in the last 72 hours. Lipid Profile No results for input(s): CHOL, HDL, LDLCALC, TRIG, CHOLHDL, LDLDIRECT in the last 72 hours. Thyroid function studies No results for input(s): TSH, T4TOTAL, T3FREE, THYROIDAB in the last 72 hours.  Invalid input(s): FREET3 Anemia work up No results for input(s): VITAMINB12, FOLATE, FERRITIN, TIBC, IRON, RETICCTPCT in the last 72 hours. Urinalysis    Component Value Date/Time   COLORURINE YELLOW 09/16/2017 2212   APPEARANCEUR CLEAR 09/16/2017 2212   LABSPEC 1.013 09/16/2017 2212   LABSPEC 1.020 03/03/2014 1341   PHURINE 6.0 09/16/2017 2212   GLUCOSEU NEGATIVE 09/16/2017 2212   GLUCOSEU Negative 03/03/2014 1341   HGBUR NEGATIVE 09/16/2017 2212   BILIRUBINUR NEGATIVE 09/16/2017 2212   BILIRUBINUR Negative 03/03/2014 1341    KETONESUR NEGATIVE 09/16/2017 2212   PROTEINUR 100 (A) 09/16/2017 2212   UROBILINOGEN 0.2 03/03/2014 1341   NITRITE NEGATIVE 09/16/2017 2212   LEUKOCYTESUR NEGATIVE 09/16/2017 2212   LEUKOCYTESUR Negative 03/03/2014 1341   Sepsis Labs Invalid input(s): PROCALCITONIN,  WBC,  LACTICIDVEN Microbiology No results found for this or any previous visit (from the past 240 hour(s)).   Time coordinating discharge: 45 minutes       SIGNED:   Harrell Gave  Shirleen Schirmer, MD  [facility] 09/18/2017, 8:37 PM

## 2017-09-29 DIAGNOSIS — G8929 Other chronic pain: Secondary | ICD-10-CM | POA: Diagnosis not present

## 2017-09-29 DIAGNOSIS — I130 Hypertensive heart and chronic kidney disease with heart failure and stage 1 through stage 4 chronic kidney disease, or unspecified chronic kidney disease: Secondary | ICD-10-CM | POA: Diagnosis not present

## 2017-09-29 DIAGNOSIS — E785 Hyperlipidemia, unspecified: Secondary | ICD-10-CM | POA: Diagnosis not present

## 2017-09-29 DIAGNOSIS — Z7984 Long term (current) use of oral hypoglycemic drugs: Secondary | ICD-10-CM | POA: Diagnosis not present

## 2017-09-29 DIAGNOSIS — I2510 Atherosclerotic heart disease of native coronary artery without angina pectoris: Secondary | ICD-10-CM | POA: Diagnosis not present

## 2017-09-29 DIAGNOSIS — I823 Embolism and thrombosis of renal vein: Secondary | ICD-10-CM | POA: Diagnosis not present

## 2017-09-29 DIAGNOSIS — R1310 Dysphagia, unspecified: Secondary | ICD-10-CM | POA: Diagnosis not present

## 2017-09-29 DIAGNOSIS — D631 Anemia in chronic kidney disease: Secondary | ICD-10-CM | POA: Diagnosis not present

## 2017-09-29 DIAGNOSIS — I5022 Chronic systolic (congestive) heart failure: Secondary | ICD-10-CM | POA: Diagnosis not present

## 2017-09-29 DIAGNOSIS — M109 Gout, unspecified: Secondary | ICD-10-CM | POA: Diagnosis not present

## 2017-09-29 DIAGNOSIS — E1122 Type 2 diabetes mellitus with diabetic chronic kidney disease: Secondary | ICD-10-CM | POA: Diagnosis not present

## 2017-09-29 DIAGNOSIS — I872 Venous insufficiency (chronic) (peripheral): Secondary | ICD-10-CM | POA: Diagnosis not present

## 2017-09-29 DIAGNOSIS — E1142 Type 2 diabetes mellitus with diabetic polyneuropathy: Secondary | ICD-10-CM | POA: Diagnosis not present

## 2017-09-29 DIAGNOSIS — Z9181 History of falling: Secondary | ICD-10-CM | POA: Diagnosis not present

## 2017-09-29 DIAGNOSIS — K862 Cyst of pancreas: Secondary | ICD-10-CM | POA: Diagnosis not present

## 2017-09-29 DIAGNOSIS — N183 Chronic kidney disease, stage 3 (moderate): Secondary | ICD-10-CM | POA: Diagnosis not present

## 2017-09-29 DIAGNOSIS — I4510 Unspecified right bundle-branch block: Secondary | ICD-10-CM | POA: Diagnosis not present

## 2017-09-29 DIAGNOSIS — Z853 Personal history of malignant neoplasm of breast: Secondary | ICD-10-CM | POA: Diagnosis not present

## 2017-09-29 DIAGNOSIS — Z7901 Long term (current) use of anticoagulants: Secondary | ICD-10-CM | POA: Diagnosis not present

## 2017-09-29 DIAGNOSIS — Z8673 Personal history of transient ischemic attack (TIA), and cerebral infarction without residual deficits: Secondary | ICD-10-CM | POA: Diagnosis not present

## 2017-09-30 ENCOUNTER — Telehealth: Payer: Self-pay | Admitting: Physical Medicine & Rehabilitation

## 2017-09-30 NOTE — Telephone Encounter (Signed)
New Message  PT verbalized she is needing verbal orders for pt: twice a week for four weeks effective 7.14.19.  Verbalized it is okay to LVM it is protected.

## 2017-09-30 NOTE — Telephone Encounter (Signed)
This patient has been admitted and discharged x 2 (08/26/17. 09/18/17) since we discharged on 08/21/17.  I need clarification that you will be the one signing the [facility] orders. Please advise.

## 2017-10-01 NOTE — Telephone Encounter (Signed)
[name], [facility] left a message asking for verbal orders for skilled nursing visits for disease mgmt. Frequency 2week2 followed by 1week1 followed by 1everyotherweek6 plus3 PRN visits. Also asking for PT, OT, ST home health evals.  Patient discharged from inpatient rehab 08/22/2017 Dr. Naaman Plummer.  Followed up by Dr. Naaman Plummer on 09/09/2017. Since then, patient has been readmitted to hospital twice and discharged by other physicians.  Dr. Charm Barges last clinic note indicates the continuation of home health therapies.   Please advise on how to proceed

## 2017-10-02 DIAGNOSIS — I130 Hypertensive heart and chronic kidney disease with heart failure and stage 1 through stage 4 chronic kidney disease, or unspecified chronic kidney disease: Secondary | ICD-10-CM | POA: Diagnosis not present

## 2017-10-02 DIAGNOSIS — E785 Hyperlipidemia, unspecified: Secondary | ICD-10-CM | POA: Diagnosis not present

## 2017-10-02 DIAGNOSIS — E1122 Type 2 diabetes mellitus with diabetic chronic kidney disease: Secondary | ICD-10-CM | POA: Diagnosis not present

## 2017-10-02 DIAGNOSIS — Z9181 History of falling: Secondary | ICD-10-CM | POA: Diagnosis not present

## 2017-10-02 DIAGNOSIS — Z853 Personal history of malignant neoplasm of breast: Secondary | ICD-10-CM | POA: Diagnosis not present

## 2017-10-02 DIAGNOSIS — K862 Cyst of pancreas: Secondary | ICD-10-CM | POA: Diagnosis not present

## 2017-10-02 DIAGNOSIS — N183 Chronic kidney disease, stage 3 (moderate): Secondary | ICD-10-CM | POA: Diagnosis not present

## 2017-10-02 DIAGNOSIS — R1310 Dysphagia, unspecified: Secondary | ICD-10-CM | POA: Diagnosis not present

## 2017-10-02 DIAGNOSIS — Z7984 Long term (current) use of oral hypoglycemic drugs: Secondary | ICD-10-CM | POA: Diagnosis not present

## 2017-10-02 DIAGNOSIS — I872 Venous insufficiency (chronic) (peripheral): Secondary | ICD-10-CM | POA: Diagnosis not present

## 2017-10-02 DIAGNOSIS — I4510 Unspecified right bundle-branch block: Secondary | ICD-10-CM | POA: Diagnosis not present

## 2017-10-02 DIAGNOSIS — G8929 Other chronic pain: Secondary | ICD-10-CM | POA: Diagnosis not present

## 2017-10-02 DIAGNOSIS — Z8673 Personal history of transient ischemic attack (TIA), and cerebral infarction without residual deficits: Secondary | ICD-10-CM | POA: Diagnosis not present

## 2017-10-02 DIAGNOSIS — I2510 Atherosclerotic heart disease of native coronary artery without angina pectoris: Secondary | ICD-10-CM | POA: Diagnosis not present

## 2017-10-02 DIAGNOSIS — I823 Embolism and thrombosis of renal vein: Secondary | ICD-10-CM | POA: Diagnosis not present

## 2017-10-02 DIAGNOSIS — I5022 Chronic systolic (congestive) heart failure: Secondary | ICD-10-CM | POA: Diagnosis not present

## 2017-10-02 DIAGNOSIS — Z7901 Long term (current) use of anticoagulants: Secondary | ICD-10-CM | POA: Diagnosis not present

## 2017-10-02 DIAGNOSIS — D631 Anemia in chronic kidney disease: Secondary | ICD-10-CM | POA: Diagnosis not present

## 2017-10-02 DIAGNOSIS — E1142 Type 2 diabetes mellitus with diabetic polyneuropathy: Secondary | ICD-10-CM | POA: Diagnosis not present

## 2017-10-02 DIAGNOSIS — M109 Gout, unspecified: Secondary | ICD-10-CM | POA: Diagnosis not present

## 2017-10-02 NOTE — Telephone Encounter (Signed)
Nicolette notified.

## 2017-10-02 NOTE — Telephone Encounter (Addendum)
I will sign  For [place] orders. Plan per [facility] is ok with me.

## 2017-10-04 DIAGNOSIS — E785 Hyperlipidemia, unspecified: Secondary | ICD-10-CM | POA: Diagnosis not present

## 2017-10-04 DIAGNOSIS — Z9181 History of falling: Secondary | ICD-10-CM | POA: Diagnosis not present

## 2017-10-04 DIAGNOSIS — G8929 Other chronic pain: Secondary | ICD-10-CM | POA: Diagnosis not present

## 2017-10-04 DIAGNOSIS — Z8673 Personal history of transient ischemic attack (TIA), and cerebral infarction without residual deficits: Secondary | ICD-10-CM | POA: Diagnosis not present

## 2017-10-04 DIAGNOSIS — E1142 Type 2 diabetes mellitus with diabetic polyneuropathy: Secondary | ICD-10-CM | POA: Diagnosis not present

## 2017-10-04 DIAGNOSIS — I2510 Atherosclerotic heart disease of native coronary artery without angina pectoris: Secondary | ICD-10-CM | POA: Diagnosis not present

## 2017-10-04 DIAGNOSIS — Z7901 Long term (current) use of anticoagulants: Secondary | ICD-10-CM | POA: Diagnosis not present

## 2017-10-04 DIAGNOSIS — R1310 Dysphagia, unspecified: Secondary | ICD-10-CM | POA: Diagnosis not present

## 2017-10-04 DIAGNOSIS — Z853 Personal history of malignant neoplasm of breast: Secondary | ICD-10-CM | POA: Diagnosis not present

## 2017-10-04 DIAGNOSIS — Z7984 Long term (current) use of oral hypoglycemic drugs: Secondary | ICD-10-CM | POA: Diagnosis not present

## 2017-10-04 DIAGNOSIS — E1122 Type 2 diabetes mellitus with diabetic chronic kidney disease: Secondary | ICD-10-CM | POA: Diagnosis not present

## 2017-10-04 DIAGNOSIS — I823 Embolism and thrombosis of renal vein: Secondary | ICD-10-CM | POA: Diagnosis not present

## 2017-10-04 DIAGNOSIS — I5022 Chronic systolic (congestive) heart failure: Secondary | ICD-10-CM | POA: Diagnosis not present

## 2017-10-04 DIAGNOSIS — K862 Cyst of pancreas: Secondary | ICD-10-CM | POA: Diagnosis not present

## 2017-10-04 DIAGNOSIS — M109 Gout, unspecified: Secondary | ICD-10-CM | POA: Diagnosis not present

## 2017-10-04 DIAGNOSIS — I872 Venous insufficiency (chronic) (peripheral): Secondary | ICD-10-CM | POA: Diagnosis not present

## 2017-10-04 DIAGNOSIS — D631 Anemia in chronic kidney disease: Secondary | ICD-10-CM | POA: Diagnosis not present

## 2017-10-04 DIAGNOSIS — I130 Hypertensive heart and chronic kidney disease with heart failure and stage 1 through stage 4 chronic kidney disease, or unspecified chronic kidney disease: Secondary | ICD-10-CM | POA: Diagnosis not present

## 2017-10-04 DIAGNOSIS — N183 Chronic kidney disease, stage 3 (moderate): Secondary | ICD-10-CM | POA: Diagnosis not present

## 2017-10-04 DIAGNOSIS — I4510 Unspecified right bundle-branch block: Secondary | ICD-10-CM | POA: Diagnosis not present

## 2017-10-07 DIAGNOSIS — I4510 Unspecified right bundle-branch block: Secondary | ICD-10-CM | POA: Diagnosis not present

## 2017-10-07 DIAGNOSIS — E1142 Type 2 diabetes mellitus with diabetic polyneuropathy: Secondary | ICD-10-CM | POA: Diagnosis not present

## 2017-10-07 DIAGNOSIS — E1122 Type 2 diabetes mellitus with diabetic chronic kidney disease: Secondary | ICD-10-CM | POA: Diagnosis not present

## 2017-10-07 DIAGNOSIS — M109 Gout, unspecified: Secondary | ICD-10-CM | POA: Diagnosis not present

## 2017-10-07 DIAGNOSIS — Z853 Personal history of malignant neoplasm of breast: Secondary | ICD-10-CM | POA: Diagnosis not present

## 2017-10-07 DIAGNOSIS — I2510 Atherosclerotic heart disease of native coronary artery without angina pectoris: Secondary | ICD-10-CM | POA: Diagnosis not present

## 2017-10-07 DIAGNOSIS — G8929 Other chronic pain: Secondary | ICD-10-CM | POA: Diagnosis not present

## 2017-10-07 DIAGNOSIS — I5022 Chronic systolic (congestive) heart failure: Secondary | ICD-10-CM | POA: Diagnosis not present

## 2017-10-07 DIAGNOSIS — Z7901 Long term (current) use of anticoagulants: Secondary | ICD-10-CM | POA: Diagnosis not present

## 2017-10-07 DIAGNOSIS — I823 Embolism and thrombosis of renal vein: Secondary | ICD-10-CM | POA: Diagnosis not present

## 2017-10-07 DIAGNOSIS — R1310 Dysphagia, unspecified: Secondary | ICD-10-CM | POA: Diagnosis not present

## 2017-10-07 DIAGNOSIS — I872 Venous insufficiency (chronic) (peripheral): Secondary | ICD-10-CM | POA: Diagnosis not present

## 2017-10-07 DIAGNOSIS — Z8673 Personal history of transient ischemic attack (TIA), and cerebral infarction without residual deficits: Secondary | ICD-10-CM | POA: Diagnosis not present

## 2017-10-07 DIAGNOSIS — Z7984 Long term (current) use of oral hypoglycemic drugs: Secondary | ICD-10-CM | POA: Diagnosis not present

## 2017-10-07 DIAGNOSIS — Z9181 History of falling: Secondary | ICD-10-CM | POA: Diagnosis not present

## 2017-10-07 DIAGNOSIS — D631 Anemia in chronic kidney disease: Secondary | ICD-10-CM | POA: Diagnosis not present

## 2017-10-07 DIAGNOSIS — I130 Hypertensive heart and chronic kidney disease with heart failure and stage 1 through stage 4 chronic kidney disease, or unspecified chronic kidney disease: Secondary | ICD-10-CM | POA: Diagnosis not present

## 2017-10-07 DIAGNOSIS — E785 Hyperlipidemia, unspecified: Secondary | ICD-10-CM | POA: Diagnosis not present

## 2017-10-07 DIAGNOSIS — N183 Chronic kidney disease, stage 3 (moderate): Secondary | ICD-10-CM | POA: Diagnosis not present

## 2017-10-07 DIAGNOSIS — K862 Cyst of pancreas: Secondary | ICD-10-CM | POA: Diagnosis not present

## 2017-10-08 ENCOUNTER — Ambulatory Visit: Payer: Medicare Other | Admitting: Hematology

## 2017-10-08 ENCOUNTER — Other Ambulatory Visit: Payer: Medicare Other

## 2017-10-09 DIAGNOSIS — C50911 Malignant neoplasm of unspecified site of right female breast: Secondary | ICD-10-CM | POA: Diagnosis not present

## 2017-10-10 DIAGNOSIS — I872 Venous insufficiency (chronic) (peripheral): Secondary | ICD-10-CM | POA: Diagnosis not present

## 2017-10-10 DIAGNOSIS — Z9181 History of falling: Secondary | ICD-10-CM | POA: Diagnosis not present

## 2017-10-10 DIAGNOSIS — Z7984 Long term (current) use of oral hypoglycemic drugs: Secondary | ICD-10-CM | POA: Diagnosis not present

## 2017-10-10 DIAGNOSIS — Z853 Personal history of malignant neoplasm of breast: Secondary | ICD-10-CM | POA: Diagnosis not present

## 2017-10-10 DIAGNOSIS — Z7901 Long term (current) use of anticoagulants: Secondary | ICD-10-CM | POA: Diagnosis not present

## 2017-10-10 DIAGNOSIS — D631 Anemia in chronic kidney disease: Secondary | ICD-10-CM | POA: Diagnosis not present

## 2017-10-10 DIAGNOSIS — R1310 Dysphagia, unspecified: Secondary | ICD-10-CM | POA: Diagnosis not present

## 2017-10-10 DIAGNOSIS — Z8673 Personal history of transient ischemic attack (TIA), and cerebral infarction without residual deficits: Secondary | ICD-10-CM | POA: Diagnosis not present

## 2017-10-10 DIAGNOSIS — E1142 Type 2 diabetes mellitus with diabetic polyneuropathy: Secondary | ICD-10-CM | POA: Diagnosis not present

## 2017-10-10 DIAGNOSIS — E1122 Type 2 diabetes mellitus with diabetic chronic kidney disease: Secondary | ICD-10-CM | POA: Diagnosis not present

## 2017-10-10 DIAGNOSIS — N183 Chronic kidney disease, stage 3 (moderate): Secondary | ICD-10-CM | POA: Diagnosis not present

## 2017-10-10 DIAGNOSIS — G8929 Other chronic pain: Secondary | ICD-10-CM | POA: Diagnosis not present

## 2017-10-10 DIAGNOSIS — E785 Hyperlipidemia, unspecified: Secondary | ICD-10-CM | POA: Diagnosis not present

## 2017-10-10 DIAGNOSIS — I2510 Atherosclerotic heart disease of native coronary artery without angina pectoris: Secondary | ICD-10-CM | POA: Diagnosis not present

## 2017-10-10 DIAGNOSIS — K862 Cyst of pancreas: Secondary | ICD-10-CM | POA: Diagnosis not present

## 2017-10-10 DIAGNOSIS — I130 Hypertensive heart and chronic kidney disease with heart failure and stage 1 through stage 4 chronic kidney disease, or unspecified chronic kidney disease: Secondary | ICD-10-CM | POA: Diagnosis not present

## 2017-10-10 DIAGNOSIS — I4510 Unspecified right bundle-branch block: Secondary | ICD-10-CM | POA: Diagnosis not present

## 2017-10-10 DIAGNOSIS — I823 Embolism and thrombosis of renal vein: Secondary | ICD-10-CM | POA: Diagnosis not present

## 2017-10-10 DIAGNOSIS — I5022 Chronic systolic (congestive) heart failure: Secondary | ICD-10-CM | POA: Diagnosis not present

## 2017-10-10 DIAGNOSIS — M109 Gout, unspecified: Secondary | ICD-10-CM | POA: Diagnosis not present

## 2017-10-11 DIAGNOSIS — I469 Cardiac arrest, cause unspecified: Secondary | ICD-10-CM | POA: Diagnosis not present

## 2017-10-11 DIAGNOSIS — Z794 Long term (current) use of insulin: Secondary | ICD-10-CM | POA: Diagnosis not present

## 2017-10-11 DIAGNOSIS — I10 Essential (primary) hypertension: Secondary | ICD-10-CM | POA: Diagnosis not present

## 2017-10-11 DIAGNOSIS — E1169 Type 2 diabetes mellitus with other specified complication: Secondary | ICD-10-CM | POA: Diagnosis not present

## 2017-10-11 DIAGNOSIS — N183 Chronic kidney disease, stage 3 (moderate): Secondary | ICD-10-CM | POA: Diagnosis not present

## 2017-10-15 DIAGNOSIS — I823 Embolism and thrombosis of renal vein: Secondary | ICD-10-CM | POA: Diagnosis not present

## 2017-10-15 DIAGNOSIS — E1142 Type 2 diabetes mellitus with diabetic polyneuropathy: Secondary | ICD-10-CM | POA: Diagnosis not present

## 2017-10-15 DIAGNOSIS — M109 Gout, unspecified: Secondary | ICD-10-CM | POA: Diagnosis not present

## 2017-10-15 DIAGNOSIS — I872 Venous insufficiency (chronic) (peripheral): Secondary | ICD-10-CM | POA: Diagnosis not present

## 2017-10-15 DIAGNOSIS — Z7984 Long term (current) use of oral hypoglycemic drugs: Secondary | ICD-10-CM | POA: Diagnosis not present

## 2017-10-15 DIAGNOSIS — N183 Chronic kidney disease, stage 3 (moderate): Secondary | ICD-10-CM | POA: Diagnosis not present

## 2017-10-15 DIAGNOSIS — I2510 Atherosclerotic heart disease of native coronary artery without angina pectoris: Secondary | ICD-10-CM | POA: Diagnosis not present

## 2017-10-15 DIAGNOSIS — K862 Cyst of pancreas: Secondary | ICD-10-CM | POA: Diagnosis not present

## 2017-10-15 DIAGNOSIS — I130 Hypertensive heart and chronic kidney disease with heart failure and stage 1 through stage 4 chronic kidney disease, or unspecified chronic kidney disease: Secondary | ICD-10-CM | POA: Diagnosis not present

## 2017-10-15 DIAGNOSIS — Z7901 Long term (current) use of anticoagulants: Secondary | ICD-10-CM | POA: Diagnosis not present

## 2017-10-15 DIAGNOSIS — I5022 Chronic systolic (congestive) heart failure: Secondary | ICD-10-CM | POA: Diagnosis not present

## 2017-10-15 DIAGNOSIS — Z9181 History of falling: Secondary | ICD-10-CM | POA: Diagnosis not present

## 2017-10-15 DIAGNOSIS — E785 Hyperlipidemia, unspecified: Secondary | ICD-10-CM | POA: Diagnosis not present

## 2017-10-15 DIAGNOSIS — E1122 Type 2 diabetes mellitus with diabetic chronic kidney disease: Secondary | ICD-10-CM | POA: Diagnosis not present

## 2017-10-15 DIAGNOSIS — Z853 Personal history of malignant neoplasm of breast: Secondary | ICD-10-CM | POA: Diagnosis not present

## 2017-10-15 DIAGNOSIS — D631 Anemia in chronic kidney disease: Secondary | ICD-10-CM | POA: Diagnosis not present

## 2017-10-15 DIAGNOSIS — R1310 Dysphagia, unspecified: Secondary | ICD-10-CM | POA: Diagnosis not present

## 2017-10-15 DIAGNOSIS — G8929 Other chronic pain: Secondary | ICD-10-CM | POA: Diagnosis not present

## 2017-10-15 DIAGNOSIS — I4510 Unspecified right bundle-branch block: Secondary | ICD-10-CM | POA: Diagnosis not present

## 2017-10-15 DIAGNOSIS — Z8673 Personal history of transient ischemic attack (TIA), and cerebral infarction without residual deficits: Secondary | ICD-10-CM | POA: Diagnosis not present

## 2017-10-16 ENCOUNTER — Inpatient Hospital Stay (HOSPITAL_BASED_OUTPATIENT_CLINIC_OR_DEPARTMENT_OTHER): Payer: Medicare Other | Admitting: Hematology

## 2017-10-16 ENCOUNTER — Inpatient Hospital Stay: Payer: Medicare Other | Attending: Hematology

## 2017-10-16 ENCOUNTER — Ambulatory Visit (HOSPITAL_COMMUNITY)
Admission: RE | Admit: 2017-10-16 | Discharge: 2017-10-16 | Disposition: A | Discharge status: Home / Self Care | Payer: Medicare Other | Source: Ambulatory Visit | Attending: Hematology | Admitting: Hematology

## 2017-10-16 ENCOUNTER — Telehealth: Payer: Self-pay

## 2017-10-16 ENCOUNTER — Encounter: Payer: Self-pay | Admitting: Hematology

## 2017-10-16 VITALS — BP 150/79 | HR 75 | Temp 98.2°F | Resp 18 | Ht 64.0 in | Wt 176.3 lb

## 2017-10-16 DIAGNOSIS — D509 Iron deficiency anemia, unspecified: Secondary | ICD-10-CM

## 2017-10-16 DIAGNOSIS — C50311 Malignant neoplasm of lower-inner quadrant of right female breast: Secondary | ICD-10-CM

## 2017-10-16 DIAGNOSIS — I1310 Hypertensive heart and chronic kidney disease without heart failure, with stage 1 through stage 4 chronic kidney disease, or unspecified chronic kidney disease: Secondary | ICD-10-CM | POA: Insufficient documentation

## 2017-10-16 DIAGNOSIS — R918 Other nonspecific abnormal finding of lung field: Secondary | ICD-10-CM | POA: Insufficient documentation

## 2017-10-16 DIAGNOSIS — Z170 Estrogen receptor positive status [ER+]: Secondary | ICD-10-CM | POA: Insufficient documentation

## 2017-10-16 DIAGNOSIS — N183 Chronic kidney disease, stage 3 (moderate): Secondary | ICD-10-CM | POA: Diagnosis not present

## 2017-10-16 DIAGNOSIS — D631 Anemia in chronic kidney disease: Secondary | ICD-10-CM

## 2017-10-16 DIAGNOSIS — I823 Embolism and thrombosis of renal vein: Secondary | ICD-10-CM | POA: Insufficient documentation

## 2017-10-16 DIAGNOSIS — Z853 Personal history of malignant neoplasm of breast: Secondary | ICD-10-CM

## 2017-10-16 DIAGNOSIS — Z7901 Long term (current) use of anticoagulants: Secondary | ICD-10-CM | POA: Insufficient documentation

## 2017-10-16 DIAGNOSIS — Z8701 Personal history of pneumonia (recurrent): Secondary | ICD-10-CM

## 2017-10-16 DIAGNOSIS — E1122 Type 2 diabetes mellitus with diabetic chronic kidney disease: Secondary | ICD-10-CM | POA: Diagnosis not present

## 2017-10-16 DIAGNOSIS — I2510 Atherosclerotic heart disease of native coronary artery without angina pectoris: Secondary | ICD-10-CM

## 2017-10-16 DIAGNOSIS — D508 Other iron deficiency anemias: Secondary | ICD-10-CM

## 2017-10-16 DIAGNOSIS — J189 Pneumonia, unspecified organism: Secondary | ICD-10-CM | POA: Diagnosis not present

## 2017-10-16 LAB — COMPREHENSIVE METABOLIC PANEL
ALBUMIN: 3.6 g/dL (ref 3.5–5.0)
ALT: 9 U/L (ref 0–44)
AST: 11 U/L — ABNORMAL LOW (ref 15–41)
Alkaline Phosphatase: 44 U/L (ref 38–126)
Anion gap: 8 (ref 5–15)
BILIRUBIN TOTAL: 0.2 mg/dL — AB (ref 0.3–1.2)
BUN: 39 mg/dL — ABNORMAL HIGH (ref 8–23)
CO2: 28 mmol/L (ref 22–32)
Calcium: 9.9 mg/dL (ref 8.9–10.3)
Chloride: 103 mmol/L (ref 98–111)
Creatinine, Ser: 1.95 mg/dL — ABNORMAL HIGH (ref 0.44–1.00)
GFR calc Af Amer: 29 mL/min — ABNORMAL LOW (ref >60)
GFR calc non Af Amer: 25 mL/min — ABNORMAL LOW (ref >60)
GLUCOSE: 230 mg/dL — AB (ref 70–99)
POTASSIUM: 4 mmol/L (ref 3.5–5.1)
SODIUM: 139 mmol/L (ref 135–145)
TOTAL PROTEIN: 8 g/dL (ref 6.5–8.1)

## 2017-10-16 LAB — CBC WITH DIFFERENTIAL/PLATELET
BASOS ABS: 0 10*3/uL (ref 0.0–0.1)
BASOS PCT: 0 %
Eosinophils Absolute: 0.1 10*3/uL (ref 0.0–0.5)
Eosinophils Relative: 1 %
HEMATOCRIT: 29 % — AB (ref 34.8–46.6)
HEMOGLOBIN: 9.3 g/dL — AB (ref 11.6–15.9)
Lymphocytes Relative: 29 %
Lymphs Abs: 2.1 10*3/uL (ref 0.9–3.3)
MCH: 27 pg (ref 25.1–34.0)
MCHC: 32.1 g/dL (ref 31.5–36.0)
MCV: 84.3 fL (ref 79.5–101.0)
MONO ABS: 0.3 10*3/uL (ref 0.1–0.9)
Monocytes Relative: 4 %
NEUTROS ABS: 4.9 10*3/uL (ref 1.5–6.5)
NEUTROS PCT: 66 %
Platelets: 345 10*3/uL (ref 145–400)
RBC: 3.44 MIL/uL — ABNORMAL LOW (ref 3.70–5.45)
RDW: 16.1 % — AB (ref 11.2–14.5)
WBC: 7.4 10*3/uL (ref 3.9–10.3)

## 2017-10-16 LAB — IRON AND TIBC
Iron: 60 ug/dL (ref 41–142)
Saturation Ratios: 15 % — ABNORMAL LOW (ref 21–57)
TIBC: 386 ug/dL (ref 236–444)
UIBC: 327 ug/dL

## 2017-10-16 LAB — FERRITIN: Ferritin: 138 ng/mL (ref 11–307)

## 2017-10-16 NOTE — Progress Notes (Signed)
[place]  [phone] [phone]  Clinic Follow up Note   Patient Care Team: Seward Carol, MD as PCP - General (Internal Medicine) Thelma Comp, [place] as Consulting Physician (Optometry) Stark Klein, MD as Consulting Physician (General Surgery) Truitt Merle, MD as Consulting Physician (Hematology)   Date of Service:  10/16/2017   CC: follow up right breast cancer and anemia  SUMMARY OF ONCOLOGIC HISTORY: Oncology History   Cancer Staging Breast cancer of lower-inner quadrant of right female breast [facility]) Staging form: Breast, AJCC 7th Edition - Clinical: Stage IA (T1b, N0, cM0) - Unsigned Staging comments: Staged at breast conference 03/25/13.  - Pathologic stage from 05/12/2013: Stage IA (T1b, N0, cM0) - Signed by Truitt Merle, MD on 06/21/2016       Breast cancer of lower-inner quadrant of right female breast ([place])   02/27/2013 Mammogram    Diagnostic mammogram and ultrasound showed a 6 x 5 mm mass at 5:00 position. Axilla was negative by ultrasound.      03/16/2013 Initial Biopsy    Right breast 5:00 position biopsy showed invasive ductal carcinoma, low-grade      03/16/2013 Receptors her2    ER100% positive, PR 100% positive, HER-2 negative, Ki-67 20%      03/18/2013 Initial Diagnosis    Breast cancer of lower-inner quadrant of right female breast ([place])      05/12/2013 Surgery    Right breast lumpectomy with sentinel lymph node biopsy      05/12/2013 Pathology Results    Right breast lumpectomy showed invasive ductal carcinoma, grade 2, 0.9 cm, 6 sentinel lymph nodes were all negative. Margins were negative.      05/12/2013 Oncotype testing    Oncotype recurrence score 19, intermediate risk, 10 year risk of distant recurrence 12% with tamoxifen for 5 years. Patient declined adjuvant chemotherapy.      06/02/2013 - 07/16/2013 Radiation Therapy    Adjuvant breast radiation with boost 1600Gy      08/06/2013 - 07/2017  Anti-estrogen oral therapy    Arimidex 1 mg daily. Stopped in 07/2017 due to acute illness       03/22/2017 Mammogram    IMPRESSION: No mammographic evidence for malignancy.       CURRENT THERAPY: Anastrozole 1 mg daily since 07/2013, stopped in 07/2017 due to cardiac arrest.   INTERVAL HISTORY:  [name] is here for a follow up of her right breast cancer and anemia. She was last seen by me 06/19/17. In interim she was hospitalized 4 times for hypoxia/Pneumonia, encephalopathy, hypoglycemia, and she was recently in the hospital for SBO.  She presented to the clinic today accompanied by her daughter. She notes from her pneumonia she had septic shock and was in the ICU and was basically in the hospital for all of May. She had anastrozole stopped during this as her heart arrested twice during her stay. She has not seen her cardiologist since her hospitalization.  She notes she is still in PT at home. She only get SOB upon exertion, but she has much improved since her pneumonia.   She notes she has a SBO in the past and surgery to remove part of her bowel. She thinks is related to her recent SBO. This has resolved.      REVIEW OF SYSTEMS:   Constitutional: Denies fevers, chills or abnormal weight loss  Eyes: Denies blurriness of vision Ears, nose, mouth, throat, and face: Denies mucositis or sore throat Respiratory: Denies cough, dyspnea or wheezes (+)  SOB upon exertion  Cardiovascular: Denies palpitation, chest discomfort or lower extremity swelling Gastrointestinal:  Denies nausea, heartburn or change in bowel habits Skin: Denies abnormal skin rashes Lymphatics: Denies new lymphadenopathy or easy bruising Neurological:Denies numbness, tingling or new weaknesses Behavioral/Psych: Mood is stable, no new changes  All other systems were reviewed with the patient and are negative.  MEDICAL HISTORY:  Past Medical History:  Diagnosis Date  . Abdominal discomfort    Chronic N/V/D.  Presumptive dx Crohn's dx per elevated p ANCA. Failed Entocort and Pentasa. Sep 2003 - ileocolectomy c anastomosis per Dr Deon Pilling 2/2 adhesions - path was hegative for Crohns. EGD, Sm bowel follow through (11/03), and an eteroclysis (10/03) were unrevealing. Cuases hypomag and hypocalcemia.  . Adnexal mass 8/03   s/p lap BSO (R ovarian fibroma) & lysis of adhesions  . Allergy    Seasonal  . Anemia    Multifactorial. Baseline HgB 10-11 ish. B12 def - 150 in 3/10. Fe Def - ferritin 35 3/10. Both are being repleted.  . Breast cancer ([place]) 03/16/13   right, 5 o'clock  . CAD (coronary artery disease) 1996   1996 - PTCA and angioplasty diagonal branch. 2000 - Rotoblator & angiopllasty of diagonal. 2006 - subendocardial AMI, DES to proximal LAD.Marland Kitchen Also had 90% stenosis in distal apical LAD. EF 55 with apical hypokinesis. Indefinite ASA and Plavix.  . CHF (congestive heart failure) ([place])   . Chronic kidney disease    Chronic renal insuff baseline Cr 1.2 - 1.4 ish.  . Chronic pain    CT 10/10 = Spinal stenosis L2 - S1.  . Diabetes mellitus    Insulin dependent  . Hx of radiation therapy 06/02/13- 07/16/13   right rbeast 4500 cGy 25 sessions, right breast boost 1600 cGy in 8 sessions  . Hyperlipidemia    Managed with both a statin and Welchol. Welchol stopped 2014 2/2 cost and started on fenofibrate   . Hypertension    2006 B renal arteries patent. 2003 MRA - no RAS. 2003 pheo W/U Dr Hassell Done reportedly negative.  . Hypoxia 07/23/2017  . Lupus ([place])   . Personal history of radiation therapy   . RBBB   . SBO (small bowel obstruction) ([place]) 09/17/2017  . Stroke [facility])    Incidental finding MRI 2002 L lacunar infarct  . Wears dentures    top    SURGICAL HISTORY: Past Surgical History:  Procedure Laterality Date  . ABDOMINAL HYSTERECTOMY    . BILATERAL SALPINGOOPHORECTOMY  8/03   Lap BSO (R ovarian fibroma) and adhesion lysis  . BOWEL RESECTION  2003   ileocolectomy with anastomosis 2/2 adhesions    . BREAST BIOPSY    . BREAST LUMPECTOMY Right   . BREAST LUMPECTOMY WITH NEEDLE LOCALIZATION AND AXILLARY SENTINEL LYMPH NODE BX Right 04/22/2013   Procedure: BREAST LUMPECTOMY WITH NEEDLE LOCALIZATION AND AXILLARY SENTINEL LYMPH NODE BX;  Surgeon: Stark Klein, MD;  Location: [place];  Service: General;  Laterality: Right;  . CARDIAC CATHETERIZATION     2 stents  . CHOLECYSTECTOMY    . HEMICOLECTOMY     R sided hemicolectomy  . HERNIA REPAIR     Ventral hernia repair  . 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  ALLERGIES:  is allergic to percocet [oxycodone-acetaminophen]; diazepam; haloperidol lactate; lorazepam; morphine sulfate; propoxyphene hcl; and  tramadol hcl.  MEDICATIONS:  Current Outpatient Medications  Medication Sig Dispense Refill  . acetaminophen (TYLENOL) 325 MG tablet Take 650 mg by mouth every 6 (six) hours as needed for mild pain.    Marland Kitchen amLODipine (NORVASC) 10 MG tablet Take 1 tablet (10 mg total) by mouth daily. 30 tablet 0  . atorvastatin (LIPITOR) 40 MG tablet Take 1 tablet (40 mg total) by mouth at bedtime. 90 tablet 3  . carvedilol (COREG) 25 MG tablet Take 1 tablet (25 mg total) by mouth 2 (two) times daily with a meal. (Patient taking differently: Take 12.5 mg by mouth 2 (two) times daily with a meal. ) 60 tablet 1  . desloratadine (CLARINEX) 5 MG tablet Take 5 mg by mouth at bedtime as needed (allergies). Reported on 04/11/2015    . fenofibrate 54 MG tablet Take 54 mg by mouth daily.     . ferrous fumarate (HEMOCYTE - 106 MG FE) 325 (106 Fe) MG TABS tablet Take 1 tablet (106 mg of iron total) by mouth daily. (Patient taking differently: Take 1 tablet by mouth every other day. ) 30 each 0  . furosemide (LASIX) 40 MG tablet Take 1 tablet (40 mg total) by mouth daily as needed for edema. 30 tablet   . gabapentin (NEURONTIN)  100 MG capsule Take 100 mg by mouth at bedtime.    Marland Kitchen linagliptin (TRADJENTA) 5 MG TABS tablet Take 5 mg by mouth daily.    Marland Kitchen loperamide (IMODIUM) 2 MG capsule TAKE ONE CAPSULE BY MOUTH ONCE DAILY AS NEEDED FOR DIARRHEA OR LOOSE STOOLS 30 capsule 2  . nitroGLYCERIN (NITROSTAT) 0.4 MG SL tablet Place 1 tablet (0.4 mg total) under the tongue every 5 (five) minutes as needed for chest pain. 25 tablet 3  . pantoprazole (PROTONIX) 40 MG tablet Take 1 tablet (40 mg total) by mouth daily. 30 tablet 1  . potassium chloride SA (K-DUR,KLOR-CON) 20 MEQ tablet Take 1 tablet (20 mEq total) by mouth daily as needed (when taking lasix). 30 tablet 1  . Rivaroxaban (XARELTO) 15 MG TABS tablet Take 1 tablet (15 mg total) by mouth 2 (two) times daily with a meal. 42 tablet 0  . rivaroxaban (XARELTO) 20 MG TABS tablet Take 1 tablet (20 mg total) by mouth daily with supper. 30 tablet 4  . ULORIC 40 MG tablet Take 1 tablet by mouth daily.    . Melatonin 3 MG TABS Take 0.5 tablets (1.5 mg total) by mouth at bedtime. (Patient not taking: Reported on 09/17/2017) 30 tablet 0   No current facility-administered medications for this visit.    Facility-Administered Medications Ordered in Other Visits  Medication Dose Route Frequency Provider Last Rate Last Dose  . 0.9 %  sodium chloride infusion   Intravenous Continuous Burgess Estelle, MD 150 mL/hr at 10/03/15 1607      PHYSICAL EXAMINATION:  ECOG PERFORMANCE STATUS:1-2  Vitals:   10/16/17 1148  BP: (!) 150/79  Pulse: 75  Resp: 18  Temp: 98.2 F (36.8 C)  SpO2: 100%   Filed Weights   10/16/17 1148  Weight: 176 lb 4.8 oz (80 kg)    GENERAL:alert, no distress and comfortable SKIN: skin color, texture, turgor are normal, no rashes or significant lesions EYES: normal, Conjunctiva are pink and non-injected, sclera clear OROPHARYNX:no exudate, no erythema and lips, buccal mucosa, and tongue normal  NECK: supple, thyroid normal size, non-tender, without  nodularity LYMPH:  no palpable lymphadenopathy in the cervical, axillary or inguinal LUNGS: clear to auscultation  and percussion with normal breathing effort HEART: regular rate & rhythm and no murmurs and no lower extremity edema ABDOMEN:abdomen soft, non-tender and normal bowel sounds Musculoskeletal:no cyanosis of digits and no clubbing  NEURO: alert & oriented x 3 with fluent speech, no focal motor/sensory deficits BREAST: (+) S/p right breast lumpectomy: surgical incision healed well with mild lymphedema, and firmness in the lower outer quadrant of right breast, non-tender. No discrete mass. Exam of the left breast and bilateral axilla were negative.  LABORATORY DATA:  I have reviewed the data as listed CBC Latest Ref Rng & Units 10/16/2017 09/18/2017 09/17/2017  WBC 3.9 - 10.3 K/uL 7.4 10.1 12.2(H)  Hemoglobin 11.6 - 15.9 g/dL 9.3(L) 9.8(L) 9.5(L)  Hematocrit 34.8 - 46.6 % 29.0(L) 31.5(L) 31.1(L)  Platelets 145 - 400 K/uL 345 385 313     CMP Latest Ref Rng & Units 10/16/2017 09/18/2017 09/17/2017  Glucose 70 - 99 mg/dL 230(H) 113(H) 209(H)  BUN 8 - 23 mg/dL 39(H) 13 23  Creatinine 0.44 - 1.00 mg/dL 1.95(H) 1.12(H) 1.26(H)  Sodium 135 - 145 mmol/L 139 140 137  Potassium 3.5 - 5.1 mmol/L 4.0 3.3(L) 3.6  Chloride 98 - 111 mmol/L 103 108 104  CO2 22 - 32 mmol/L _0 Calcium 8.9 - 10.3 mg/dL 9.9 9.3 9.5  Total Protein 6.5 - 8.1 g/dL 8.0 - -  Total Bilirubin 0.3 - 1.2 mg/dL 0.2(L) - -  Alkaline Phos 38 - 126 U/L 44 - -  AST 15 - 41 U/L 11(L) - -  ALT 0 - 44 U/L 9 - -   PROCURES  ECHO 10/26/16 Study Conclusions - Left ventricle: The cavity size was normal. Wall thickness was   increased in a pattern of moderate LVH. Systolic function was   normal. The estimated ejection fraction was in the range of 60%   to 65%. Wall motion was normal; there were no regional wall   motion abnormalities. Normal GLPSS at -23%. Doppler parameters   are consistent with abnormal left ventricular  relaxation (grade 1   diastolic dysfunction). The Ee&' ratio is between 8-15, suggesting   indeterminate LV filling pressure. - Mitral valve: Mildly thickened leaflets . There was trivial   regurgitation. - Left atrium: The atrium was normal in size. - Tricuspid valve: There was mild regurgitation. - Pulmonary arteries: PA peak pressure: 42 mm Hg (S). - Inferior vena cava: The vessel was normal in size. The   respirophasic diameter changes were in the normal range (>= 50%),   consistent with normal central venous pressure. Impressions: - Compared to a prior study in 2012, the LVEF is stable. The RVSP   is now higher at 42 mmHg (compared to 32 mmHg previously).   RADIOGRAPHIC STUDIES: I have personally reviewed the radiological images as listed and agreed with the findings in the report. Dg Chest 2 View  Result Date: 10/16/2017 CLINICAL DATA:  Post pneumonia in May. EXAM: CHEST - 2 VIEW COMPARISON:  08/03/2017 FINDINGS: Cardiomediastinal silhouette is normal. Mediastinal contours appear intact. There is no evidence of pleural effusion or pneumothorax. Residual linear airspace opacities in the left upper hemithorax Osseous structures are without acute abnormality. Right-sided chest wall postsurgical changes noted. IMPRESSION: Residual linear airspace opacities in the left upper hemithorax. These may represent residual peribronchial disease, atelectasis, scarring or potentially pulmonary nodules. Electronically Signed   By: Fidela Salisbury M.D.   On: 10/16/2017 12:58     Diagnostic Mammogram 03/22/17 IMPRESSION: No mammographic evidence for malignancy.  ASSESSMENT & PLAN: 72 y.o. African-American female, post menopause  1.  Breast cancer of the lower inner quadrant of the right breast, invasive ductal carcinoma with DCIS, pT1bN0Mo, stage IA,   ER+ and PR +, HER 2 negative -I previously reviewed her medical records extensively, and confirmed key findings with patient.  -She has had  very early stage breast cancer, and complete surgical resection on 05/12/13, her risk of recurrence is low based on the Oncotype.  -She has been on Anastrozole since 08/06/13, tolerating well. Plan for total 5-7 years.  -She was hospitalized in May 2019 for pneumonia, developed septic shock and cardiac arrest.  Anastrozole was stopped during the hospital stay by cardiology. -I discussed she has had 4 years of Anastrozole, but given her recent lung infection and cardiac arrest, It is reasonable to stop anastrozole as it can increase risk for CAD.  -I reviewed her recent scans from hospitalization which do not show evidence of metastatic cancer.  -She is clinically doing well. Labs reviewed she has moderate CKD and Anemia with Hg at 9.3. Her Iron panel is still pending. Her physical exam was unremarkable except the fullness in the outer lower quadrant of right breast. Her mammogram from 03/22/17 revealed no evidence of malignancy. There is no clinical concern for recurrence. -Next mammogram in 03/2018.  -Continue breast cancer surveillance, f/u in 4 months    2. Anemia of chronic disease (CKD), Iron deficiency  -present back to 2012. Appears secondary from CKD and other chronic diseases by evaluation as noted and seems asymptomatic.  -I have previously encouraged her to eat more iron rich foods and take multivitamins - Patient receives a B12 shot every month at PCP's office -I previously discussed the role of EPO in anemia from CKD I recommend her to start if her Hb persistently below 9.0 -She had low transferrin saturation and high TIBC, consistent with mild iron deficiency. I previously recommended her to start OTC iron pill ferrous sulfate 1-2 tab daily, constipation reviewed. She agreed. I will schedule her to repeat lab in 2 months, and I will consider iv iron if her iron level does not improve. -I previously discussed the benefits and risks of Aranesp injection. She is interested in injection, I gave  her reading material. She will think about this.  -I previously advised her to increase her oral iron pil to BID in the meantime. -Hg 9.3 today (10/16/17), no need for blood transfusion today    3. Low Back, right hip and RLE pain/stiffness from degenerative arthritis and spinal stenosis - f/u with Orthopedics    4. DM - managed by PCP - I previously encouraged her to exercise to help with her DM - Previously I strongly advised the patient to watch her BP and glucose levels.  5. Chronic renal insufficieny, CKD stage III  -She sees Dr. Hollie Salk every 3 months and will f/u for her kidney function.  -She knows to avoid nephrotoxins -Cr at 1.95, BUN at 39 today (10/16/17)  6. CAD, HTN - followed by Dr. Johnsie Cancel  -She was hospitalized in 07/2017 with Pneumonia and became septic and had cardiac arrest twice -I strongly advised her to follow up with Dr. Johnsie Cancel as soon as possible.   7. Genetics - Patient has multiple family members who have/had cancer - She has had genetic testing which came back normal  8. Hot Flashes  -She takes 300 mg Neuronin during the day and 600 mg in the evening. Hot flash is improved  If  that does not help I will call in SSRI for her hot flash.  -Not mentioned in today's visit, likely improved or resolved.   9. [place] was in 02/2016 with T score of 1.0, normal  -We previously discussed the potential osteopenia and osteoporosis from anastrozole, she will continue vitamin D supplement. -Next DEXA due 02/2018, will order next visit    10. Left Renal Vein Thrombosis -Likely provoked from acute illness and SBO. SBO has since resolved.  -She can continue Xarelto for another 3 months then stop and switch to baby aspirin. Will do blood test follow up in 01/2018 to rule out hypercoagulopathy.  11. History of pneumonia in 07/2017 -will repeat CXR today to ensure the complete recovery  -if abnormal,will get a ct chest   PLAN  -she has stopped anastrozole  in May 2019, will continue holding it due to cardiac risk  -Continue Xarelto, OK to stop after 3 months therapy  -F/u in 4 months with lab one week before for hypercoagulopathy workup  -CXR today, which showed residual linear airspace opacities in the left upper lung, I will get a CT chest without contrast in the next few weeks.   No problem-specific Assessment & Plan notes found for this encounter.   Orders Placed This Encounter  Procedures  . DG Chest 2 View    Standing Status:   Future    Number of Occurrences:   1    Standing Expiration Date:   10/16/2018    Order Specific Question:   Reason for Exam (SYMPTOM  OR DIAGNOSIS REQUIRED)    Answer:   follow up left pneumonia    Order Specific Question:   Preferred imaging location?    Answer:   [facility]    Order Specific Question:   Radiology Contrast Protocol - do NOT remove file path    Answer:   [email or web address]   All questions were answered. The patient knows to call the clinic with any problems, questions or concerns. No barriers to learning was detected.  I spent 30 minutes counseling the patient face to face. The total time spent in the appointment was 40 minutes and more than 50% was on counseling and review of test results  I, Joslyn Devon, am acting as scribe for Truitt Merle, MD.   I have reviewed the above documentation for accuracy and completeness, and I agree with the above.     Truitt Merle, MD 10/16/2017

## 2017-10-16 NOTE — Progress Notes (Signed)
Patient ID: [name], female   DOB: 16-Jun-1945, 72 y.o.   MRN: [id number]   [name] is seen today for F/U of CAD with pevious rotoblator to the LAD in 2000. Her last myovue on 11/2006 showed mild apical thinning and was low risk. She has not had any SSCP and has been compliant with her meds.Her BP is alwyas hard to Rx and she is on multiple drugs with persistant mild tachycardia. She denies dyspnea, PND orthopnea. Has some swelling in her feet. Diet still an issue with her DM.   June admitted with sepsis syndrome pneumonia She had alveolar hemorrhage and brief PEA Arrest post bronchoscopy and was intubated for days and d/c to inpatient rehab   Breast cancer and sees Dr Burr Medico was on Arimidex but hospitalized in July with SBO pneumonia and pancreatic cyst. Noted to have renal vein thrombosis and placed on xarelto For 6 months   Last TTE done 07/27/17 reviewed EF 55-60% Moderate MR  Estimated PA pressure 64 mmHg  Since d/c doing well. No abdominal pain cough dyspnea or chest pain   ROS: Denies fever, malais, weight loss, blurry vision, decreased visual acuity, cough, sputum, SOB, hemoptysis, pleuritic pain, palpitaitons, heartburn, abdominal pain, melena, lower extremity edema, claudication, or rash.  All other systems reviewed and negative  General: Affect appropriate Healthy:  appears stated age 72: normal Neck supple with no adenopathy JVP normal no bruits no thyromegaly Lungs clear with no wheezing and good diaphragmatic motion Heart:  S1/S2 2/6 SEM murmur, no rub, gallop or click PMI normal Abdomen: benighn, BS positve, no tenderness, no AAA no bruit.  No HSM or HJR Distal pulses intact with no bruits Plus one RLE  Edema Plus 2 LLE  Neuro non-focal Skin warm and dry No muscular weakness   Current Outpatient Medications  Medication Sig Dispense Refill  . acetaminophen (TYLENOL) 325 MG tablet Take 650 mg by mouth every 6 (six) hours as needed for mild pain.    Marland Kitchen amLODipine  (NORVASC) 10 MG tablet Take 1 tablet (10 mg total) by mouth daily. 30 tablet 0  . atorvastatin (LIPITOR) 40 MG tablet Take 1 tablet (40 mg total) by mouth at bedtime. 90 tablet 3  . carvedilol (COREG) 25 MG tablet Take 25 mg by mouth as directed.    . desloratadine (CLARINEX) 5 MG tablet Take 5 mg by mouth at bedtime as needed (allergies). Reported on 04/11/2015    . fenofibrate 54 MG tablet Take 54 mg by mouth daily.     . ferrous fumarate (HEMOCYTE - 106 MG FE) 325 (106 Fe) MG TABS tablet Take 1 tablet by mouth as directed.    . furosemide (LASIX) 40 MG tablet Take 40 mg by mouth as directed.    . gabapentin (NEURONTIN) 100 MG capsule Take 100 mg by mouth at bedtime.    Marland Kitchen linagliptin (TRADJENTA) 5 MG TABS tablet Take 5 mg by mouth daily.    Marland Kitchen loperamide (IMODIUM) 2 MG capsule TAKE ONE CAPSULE BY MOUTH ONCE DAILY AS NEEDED FOR DIARRHEA OR LOOSE STOOLS 30 capsule 2  . nitroGLYCERIN (NITROSTAT) 0.4 MG SL tablet Place 1 tablet (0.4 mg total) under the tongue every 5 (five) minutes as needed for chest pain. 25 tablet 3  . pantoprazole (PROTONIX) 40 MG tablet Take 1 tablet (40 mg total) by mouth daily. 30 tablet 1  . potassium chloride SA (K-DUR,KLOR-CON) 20 MEQ tablet Take 1 tablet (20 mEq total) by mouth daily as needed (when taking lasix). [place]  tablet 1  . rivaroxaban (XARELTO) 20 MG TABS tablet Take 1 tablet (20 mg total) by mouth daily with supper. 30 tablet 4  . ULORIC 40 MG tablet Take 1 tablet by mouth daily.     No current facility-administered medications for this visit.    Facility-Administered Medications Ordered in Other Visits  Medication Dose Route Frequency Provider Last Rate Last Dose  . 0.9 %  sodium chloride infusion   Intravenous Continuous Burgess Estelle, MD 150 mL/hr at 10/03/15 1607      Allergies  Percocet [oxycodone-acetaminophen]; Diazepam; Haloperidol lactate; Lorazepam; Morphine sulfate; Propoxyphene hcl; and Tramadol hcl  Electrocardiogram:  2916 SR RBBB normal ST  segments  05/18/16  SR rate 85 LAD RBBB   Assessment and Plan  CAD: Stable with no angina and good activity level.  Continue medical Rx HTN: ARB stopped in hospital and placed on norvasc despite previous edema stable at this time but may need to change in future  Chol:  Cholesterol is at goal.  Continue current dose of statin and diet Rx.   DM: Discussed low carb diet.  Target hemoglobin A1c is 6.5 or less.  Continue current medications. Breast Cancer f/u oncology post XRT RBBB ECG stable no change  Neuropathy stable continue neurontin  Edema:  Etiology not clear EF normal only grade one diastolic  Venous duplex normal  Pneumonia:  Sepsis syndrome f/u CXR 10/16/17 some residual linear airspace opacities in LUL Improved   [name]

## 2017-10-16 NOTE — Telephone Encounter (Signed)
Printed avs and calender of upcoming appointment. Per 7/31 los

## 2017-10-17 ENCOUNTER — Ambulatory Visit (INDEPENDENT_AMBULATORY_CARE_PROVIDER_SITE_OTHER): Payer: Medicare Other | Admitting: Cardiovascular Disease

## 2017-10-17 ENCOUNTER — Telehealth: Payer: Self-pay | Admitting: Hematology

## 2017-10-17 ENCOUNTER — Encounter: Payer: Self-pay | Admitting: Cardiovascular Disease

## 2017-10-17 VITALS — BP 142/64 | HR 76 | Ht 64.0 in | Wt 171.8 lb

## 2017-10-17 DIAGNOSIS — I2510 Atherosclerotic heart disease of native coronary artery without angina pectoris: Secondary | ICD-10-CM

## 2017-10-17 DIAGNOSIS — I10 Essential (primary) hypertension: Secondary | ICD-10-CM | POA: Diagnosis not present

## 2017-10-17 NOTE — Patient Instructions (Addendum)
Medication Instructions:  Your physician recommends that you continue on your current medications as directed. Please refer to the Current Medication list given to you today.  Labwork: NONE  Testing/Procedures: NONE  Follow-Up: Your physician wants you to follow-up in: 3 months with Dr. Johnsie Cancel.   If you need a refill on your cardiac medications before your next appointment, please call your pharmacy.

## 2017-10-17 NOTE — Telephone Encounter (Signed)
Appt s cancelled per 7/31 sch message

## 2017-10-18 ENCOUNTER — Telehealth: Payer: Self-pay

## 2017-10-18 NOTE — Telephone Encounter (Signed)
Spoke with patient per Dr. Burr Medico with CXR results, she has ordered a CT of the chest to be done in 2 weeks, patient was given the number to central scheduling to call if she does not hear from them by next week.

## 2017-10-18 NOTE — Telephone Encounter (Signed)
-----   Message from Truitt Merle, MD sent at 10/16/2017 11:19 PM EDT ----- Please let pt know the CXR result, and I have ordered a CT chest to be done in 2 weeks. I will cancel her old appointment on 12/19/2017.  Thanks  Truitt Merle  10/16/2017

## 2017-10-19 ENCOUNTER — Encounter: Payer: Self-pay | Admitting: Internal Medicine

## 2017-10-22 ENCOUNTER — Encounter: Payer: Self-pay | Admitting: Physical Medicine & Rehabilitation

## 2017-10-22 ENCOUNTER — Telehealth: Payer: Self-pay

## 2017-10-22 ENCOUNTER — Encounter: Payer: Medicare Other | Attending: Physical Medicine & Rehabilitation | Admitting: Physical Medicine & Rehabilitation

## 2017-10-22 VITALS — BP 154/84 | HR 75 | Ht 63.0 in | Wt 175.0 lb

## 2017-10-22 DIAGNOSIS — Z8673 Personal history of transient ischemic attack (TIA), and cerebral infarction without residual deficits: Secondary | ICD-10-CM | POA: Insufficient documentation

## 2017-10-22 DIAGNOSIS — E1122 Type 2 diabetes mellitus with diabetic chronic kidney disease: Secondary | ICD-10-CM | POA: Insufficient documentation

## 2017-10-22 DIAGNOSIS — E11649 Type 2 diabetes mellitus with hypoglycemia without coma: Secondary | ICD-10-CM | POA: Diagnosis not present

## 2017-10-22 DIAGNOSIS — K5090 Crohn's disease, unspecified, without complications: Secondary | ICD-10-CM | POA: Diagnosis not present

## 2017-10-22 DIAGNOSIS — E785 Hyperlipidemia, unspecified: Secondary | ICD-10-CM | POA: Insufficient documentation

## 2017-10-22 DIAGNOSIS — Z853 Personal history of malignant neoplasm of breast: Secondary | ICD-10-CM | POA: Diagnosis not present

## 2017-10-22 DIAGNOSIS — R490 Dysphonia: Secondary | ICD-10-CM | POA: Diagnosis not present

## 2017-10-22 DIAGNOSIS — Z801 Family history of malignant neoplasm of trachea, bronchus and lung: Secondary | ICD-10-CM | POA: Diagnosis not present

## 2017-10-22 DIAGNOSIS — Z8249 Family history of ischemic heart disease and other diseases of the circulatory system: Secondary | ICD-10-CM | POA: Insufficient documentation

## 2017-10-22 DIAGNOSIS — Z8709 Personal history of other diseases of the respiratory system: Secondary | ICD-10-CM | POA: Diagnosis not present

## 2017-10-22 DIAGNOSIS — I252 Old myocardial infarction: Secondary | ICD-10-CM | POA: Insufficient documentation

## 2017-10-22 DIAGNOSIS — I5022 Chronic systolic (congestive) heart failure: Secondary | ICD-10-CM | POA: Diagnosis not present

## 2017-10-22 DIAGNOSIS — G9340 Encephalopathy, unspecified: Secondary | ICD-10-CM | POA: Diagnosis not present

## 2017-10-22 DIAGNOSIS — Z803 Family history of malignant neoplasm of breast: Secondary | ICD-10-CM | POA: Insufficient documentation

## 2017-10-22 DIAGNOSIS — Z841 Family history of disorders of kidney and ureter: Secondary | ICD-10-CM | POA: Diagnosis not present

## 2017-10-22 DIAGNOSIS — Z8042 Family history of malignant neoplasm of prostate: Secondary | ICD-10-CM | POA: Insufficient documentation

## 2017-10-22 DIAGNOSIS — M545 Low back pain, unspecified: Secondary | ICD-10-CM

## 2017-10-22 DIAGNOSIS — K219 Gastro-esophageal reflux disease without esophagitis: Secondary | ICD-10-CM

## 2017-10-22 DIAGNOSIS — Z800 Family history of malignant neoplasm of digestive organs: Secondary | ICD-10-CM | POA: Insufficient documentation

## 2017-10-22 DIAGNOSIS — R5381 Other malaise: Secondary | ICD-10-CM | POA: Diagnosis not present

## 2017-10-22 DIAGNOSIS — Z955 Presence of coronary angioplasty implant and graft: Secondary | ICD-10-CM | POA: Diagnosis not present

## 2017-10-22 DIAGNOSIS — I2510 Atherosclerotic heart disease of native coronary artery without angina pectoris: Secondary | ICD-10-CM | POA: Insufficient documentation

## 2017-10-22 DIAGNOSIS — Z8674 Personal history of sudden cardiac arrest: Secondary | ICD-10-CM | POA: Diagnosis not present

## 2017-10-22 DIAGNOSIS — I130 Hypertensive heart and chronic kidney disease with heart failure and stage 1 through stage 4 chronic kidney disease, or unspecified chronic kidney disease: Secondary | ICD-10-CM | POA: Diagnosis not present

## 2017-10-22 DIAGNOSIS — G8929 Other chronic pain: Secondary | ICD-10-CM | POA: Insufficient documentation

## 2017-10-22 DIAGNOSIS — M329 Systemic lupus erythematosus, unspecified: Secondary | ICD-10-CM | POA: Insufficient documentation

## 2017-10-22 DIAGNOSIS — I4510 Unspecified right bundle-branch block: Secondary | ICD-10-CM | POA: Insufficient documentation

## 2017-10-22 DIAGNOSIS — Z90710 Acquired absence of both cervix and uterus: Secondary | ICD-10-CM | POA: Diagnosis not present

## 2017-10-22 DIAGNOSIS — M48061 Spinal stenosis, lumbar region without neurogenic claudication: Secondary | ICD-10-CM | POA: Insufficient documentation

## 2017-10-22 DIAGNOSIS — Z9889 Other specified postprocedural states: Secondary | ICD-10-CM | POA: Diagnosis not present

## 2017-10-22 DIAGNOSIS — Z9049 Acquired absence of other specified parts of digestive tract: Secondary | ICD-10-CM | POA: Insufficient documentation

## 2017-10-22 DIAGNOSIS — M5136 Other intervertebral disc degeneration, lumbar region: Secondary | ICD-10-CM | POA: Diagnosis not present

## 2017-10-22 DIAGNOSIS — C50911 Malignant neoplasm of unspecified site of right female breast: Secondary | ICD-10-CM | POA: Diagnosis not present

## 2017-10-22 DIAGNOSIS — Z794 Long term (current) use of insulin: Secondary | ICD-10-CM | POA: Insufficient documentation

## 2017-10-22 MED ORDER — METHOCARBAMOL 500 MG PO TABS: 500.0000 mg | ORAL_TABLET | Freq: Three times a day (TID) | ORAL | 1 refills | Status: DC | PRN

## 2017-10-22 MED ORDER — PANTOPRAZOLE SODIUM 40 MG PO TBEC: 40.0000 mg | DELAYED_RELEASE_TABLET | Freq: Every day | ORAL | 3 refills | Status: DC

## 2017-10-22 NOTE — Telephone Encounter (Signed)
[name] called inquiring about why CT scan has not been scheduled, gave her the number for Central Scheduling she will call.

## 2017-10-22 NOTE — Patient Instructions (Addendum)
PLEASE FEEL FREE TO CALL OUR OFFICE WITH ANY PROBLEMS OR QUESTIONS ([phone])     don't forget to use heat for back. Ice too can help

## 2017-10-22 NOTE — Progress Notes (Signed)
Subjective:    Patient ID: [name], female    DOB: Oct 04, 1945, 72 y.o.   MRN: [id number]  HPI   [name] is here in follow up of her encephalopathy and low back pain.  I saw her at the end of June.  She was in the hospital briefly at the beginning of July with small bowel obstruction and a renal vein thrombosis.  She is now on Xarelto.  She states that her bowels have been moving regularly since the admission.  She complains of ongoing right leg and low back pain.  She states that she did some of the exercises I provided her but her back still bothers her.  I had ordered x-rays at the time of her last visit.  We reviewed these today and x-rays reveal degenerative disc disease at L2-L3 L3-L4 and L4-L5.  -L4-L5appear to be most affected.  Pain Inventory Average Pain 6 Pain Right Now 6 My pain is constant and aching  In the last 24 hours, has pain interfered with the following? General activity 5 Relation with others 5 Enjoyment of life 7 What TIME of day is your pain at its worst? morning Sleep (in general) Fair  Pain is worse with: walking, bending, sitting and standing Pain improves with: na Relief from Meds: na  Mobility ability to climb steps?  yes  Function not employed: date last employed na  Neuro/Psych trouble walking  Prior Studies Any changes since last visit?  no  Physicians involved in your care Any changes since last visit?  no   Family History  Problem Relation Age of Onset  . Pancreatic cancer Brother 13  . Cancer Mother   . Breast cancer Mother 61  . Lung cancer Maternal Aunt   . Breast cancer Maternal Aunt   . Prostate cancer Maternal Uncle   . Heart attack Maternal Grandmother   . Breast cancer Maternal Grandmother   . Kidney failure Maternal Aunt   . Hypertension Daughter    Social History   Socioeconomic History  . Marital status: Legally Separated    Spouse name: Not on file  . Number of children: 4  . Years of education: Not on  file  . Highest education level: Not on file  Occupational History    Employer: [employer]  Social Needs  . Financial resource strain: Not on file  . Food insecurity:    Worry: Not on file    Inability: Not on file  . Transportation needs:    Medical: Not on file    Non-medical: Not on file  Tobacco Use  . Smoking status: Never Smoker  . Smokeless tobacco: Never Used  Substance and Sexual Activity  . Alcohol use: No    Alcohol/week: 0.0 oz  . Drug use: No  . Sexual activity: Not on file  Lifestyle  . Physical activity:    Days per week: Not on file    Minutes per session: Not on file  . Stress: Not on file  Relationships  . Social connections:    Talks on phone: Not on file    Gets together: Not on file    Attends religious service: Not on file    Active member of club or organization: Not on file    Attends meetings of clubs or organizations: Not on file    Relationship status: Not on file  Other Topics Concern  . Not on file  Social History Narrative  . Not on file  Past Surgical History:  Procedure Laterality Date  . ABDOMINAL HYSTERECTOMY    . BILATERAL SALPINGOOPHORECTOMY  8/03   Lap BSO (R ovarian fibroma) and adhesion lysis  . BOWEL RESECTION  2003   ileocolectomy with anastomosis 2/2 adhesions  . BREAST BIOPSY    . BREAST LUMPECTOMY Right   . BREAST LUMPECTOMY WITH NEEDLE LOCALIZATION AND AXILLARY SENTINEL LYMPH NODE BX Right 04/22/2013   Procedure: BREAST LUMPECTOMY WITH NEEDLE LOCALIZATION AND AXILLARY SENTINEL LYMPH NODE BX;  Surgeon: Stark Klein, MD;  Location: [facility];  Service: General;  Laterality: Right;  . CARDIAC CATHETERIZATION     2 stents  . CHOLECYSTECTOMY    . HEMICOLECTOMY     R sided hemicolectomy  . HERNIA REPAIR     Ventral hernia repair  . PTCA  4/06   Past Medical History:  Diagnosis Date  . Abdominal discomfort    Chronic N/V/D. Presumptive dx Crohn's dx per elevated p ANCA. Failed Entocort and  Pentasa. Sep 2003 - ileocolectomy c anastomosis per Dr Deon Pilling 2/2 adhesions - path was hegative for Crohns. EGD, Sm bowel follow through (11/03), and an eteroclysis (10/03) were unrevealing. Cuases hypomag and hypocalcemia.  . Adnexal mass 8/03   s/p lap BSO (R ovarian fibroma) & lysis of adhesions  . Allergy    Seasonal  . Anemia    Multifactorial. Baseline HgB 10-11 ish. B12 def - 150 in 3/10. Fe Def - ferritin 35 3/10. Both are being repleted.  . Breast cancer ([place]) 03/16/13   right, 5 o'clock  . CAD (coronary artery disease) 1996   1996 - PTCA and angioplasty diagonal branch. 2000 - Rotoblator & angiopllasty of diagonal. 2006 - subendocardial AMI, DES to proximal LAD.Marland Kitchen Also had 90% stenosis in distal apical LAD. EF 55 with apical hypokinesis. Indefinite ASA and Plavix.  . CHF (congestive heart failure) ([place])   . Chronic kidney disease    Chronic renal insuff baseline Cr 1.2 - 1.4 ish.  . Chronic pain    CT 10/10 = Spinal stenosis L2 - S1.  . Diabetes mellitus    Insulin dependent  . Hx of radiation therapy 06/02/13- 07/16/13   right rbeast 4500 cGy 25 sessions, right breast boost 1600 cGy in 8 sessions  . Hyperlipidemia    Managed with both a statin and Welchol. Welchol stopped 2014 2/2 cost and started on fenofibrate   . Hypertension    2006 B renal arteries patent. 2003 MRA - no RAS. 2003 pheo W/U Dr Hassell Done reportedly negative.  . Hypoxia 07/23/2017  . Lupus ([place])   . Personal history of radiation therapy   . RBBB   . SBO (small bowel obstruction) ([place]) 09/17/2017  . Stroke [facility])    Incidental finding MRI 2002 L lacunar infarct  . Wears dentures    top   There were no vitals taken for this visit.  Opioid Risk Score:   Fall Risk Score:  `1  Depression screen PHQ 2/9  Depression screen [facility] 2/9 07/23/2017 04/18/2017 12/20/2016 10/16/2016 10/11/2016 06/11/2016 05/24/2016  Decreased Interest 0 0 0 0 0 0 0  Down, Depressed, Hopeless 0 0 0 0 0 0 0  PHQ - 2 Score 0 0 0 0 0 0 0  Some  recent data might be hidden     Review of Systems  Constitutional: Negative.   HENT: Negative.   Eyes: Negative.   Respiratory: Negative.   Cardiovascular: Negative.   Gastrointestinal: Negative.   Endocrine: Negative.   Genitourinary:  Negative.   Musculoskeletal: Positive for arthralgias, gait problem and myalgias.  Skin: Negative.   Allergic/Immunologic: Negative.   Hematological: Negative.   Psychiatric/Behavioral: Negative.   All other systems reviewed and are negative.      Objective:   Physical Exam  General: No acute distress HEENT: EOMI, oral membranes moist Cards: reg rate  Chest: normal effort Abdomen: Soft, NT, ND Skin: dry, intact Extremities: no edema Musc:  Lumbar paraspinals remain very taut to palpation and tender to touch along the right low back.  She has pain at the right PSIS area as well.  She is able to flex without too much discomfort.  Lateral bending to the right and left cause pain in extension causes some discomfort to a lesser extent.  Hamstrings are very tight bilaterally right more than left.  Seated slump test otherwise was negative as was to straight leg raise. Marland Kitchen Neurological:  Alert.  Transfers easily.  Tends to drop into the right leg with weightbearing on that side during gait. Motor: Strength is 5 out of 5 in all 4 limbs although she does favor the right leg during weightbearing as I noted above Psychiatric: cooperative         Assessment & Plan:  Medical Problem List and Plan:  1. Debility and encephalopathy secondary to ARDS/cardiogenic shock/PEA arrest /CAP/multi-medical-extubated 07/31/2017             -Patient has progressed nicely overall.  We did discuss return to work and I recommended that she wait until we deal with her back further. 2.  Pain Management: low back pain may be multifactorial.  She has history of some sort of lumbar surgery in the lower lumbar spine.  Her paraspinals are tight especially on the right side             -Continue with home exercise program for low back stretches and posture.            -Made referral to outpatient physical therapy to address range of motion, modalities, Pilates based principles, and establishment of home exercise program.    -Trial of Robaxin for muscle spasm 500 mg every 8 hours as needed   -Consider further imaging if pain does not improve. 4. Mood/delirium/insomnia: Resolved/off all meds 5.  Dysphonia: Seems to have improved  6. Chronic systolic congestive heart failure. -Encourage monitoring of weights at home 7. CAD/CKD/DM per primary    15 minutes were spent in direct patient contact today.  I or my nurse practitioner will see her back in about 2 months time to discuss issues above

## 2017-10-23 DIAGNOSIS — C50911 Malignant neoplasm of unspecified site of right female breast: Secondary | ICD-10-CM | POA: Diagnosis not present

## 2017-10-23 DIAGNOSIS — D631 Anemia in chronic kidney disease: Secondary | ICD-10-CM | POA: Diagnosis not present

## 2017-10-23 DIAGNOSIS — N183 Chronic kidney disease, stage 3 (moderate): Secondary | ICD-10-CM | POA: Diagnosis not present

## 2017-10-23 DIAGNOSIS — Z8673 Personal history of transient ischemic attack (TIA), and cerebral infarction without residual deficits: Secondary | ICD-10-CM | POA: Diagnosis not present

## 2017-10-23 DIAGNOSIS — E1122 Type 2 diabetes mellitus with diabetic chronic kidney disease: Secondary | ICD-10-CM | POA: Diagnosis not present

## 2017-10-23 DIAGNOSIS — I130 Hypertensive heart and chronic kidney disease with heart failure and stage 1 through stage 4 chronic kidney disease, or unspecified chronic kidney disease: Secondary | ICD-10-CM | POA: Diagnosis not present

## 2017-10-23 DIAGNOSIS — G8929 Other chronic pain: Secondary | ICD-10-CM | POA: Diagnosis not present

## 2017-10-23 DIAGNOSIS — E1142 Type 2 diabetes mellitus with diabetic polyneuropathy: Secondary | ICD-10-CM | POA: Diagnosis not present

## 2017-10-23 DIAGNOSIS — K862 Cyst of pancreas: Secondary | ICD-10-CM | POA: Diagnosis not present

## 2017-10-23 DIAGNOSIS — Z7984 Long term (current) use of oral hypoglycemic drugs: Secondary | ICD-10-CM | POA: Diagnosis not present

## 2017-10-23 DIAGNOSIS — I872 Venous insufficiency (chronic) (peripheral): Secondary | ICD-10-CM | POA: Diagnosis not present

## 2017-10-23 DIAGNOSIS — I823 Embolism and thrombosis of renal vein: Secondary | ICD-10-CM | POA: Diagnosis not present

## 2017-10-23 DIAGNOSIS — E785 Hyperlipidemia, unspecified: Secondary | ICD-10-CM | POA: Diagnosis not present

## 2017-10-23 DIAGNOSIS — Z9181 History of falling: Secondary | ICD-10-CM | POA: Diagnosis not present

## 2017-10-23 DIAGNOSIS — Z7901 Long term (current) use of anticoagulants: Secondary | ICD-10-CM | POA: Diagnosis not present

## 2017-10-23 DIAGNOSIS — Z853 Personal history of malignant neoplasm of breast: Secondary | ICD-10-CM | POA: Diagnosis not present

## 2017-10-23 DIAGNOSIS — I4510 Unspecified right bundle-branch block: Secondary | ICD-10-CM | POA: Diagnosis not present

## 2017-10-23 DIAGNOSIS — M109 Gout, unspecified: Secondary | ICD-10-CM | POA: Diagnosis not present

## 2017-10-23 DIAGNOSIS — I2510 Atherosclerotic heart disease of native coronary artery without angina pectoris: Secondary | ICD-10-CM | POA: Diagnosis not present

## 2017-10-23 DIAGNOSIS — I5022 Chronic systolic (congestive) heart failure: Secondary | ICD-10-CM | POA: Diagnosis not present

## 2017-10-23 DIAGNOSIS — R1310 Dysphagia, unspecified: Secondary | ICD-10-CM | POA: Diagnosis not present

## 2017-10-24 ENCOUNTER — Ambulatory Visit (HOSPITAL_COMMUNITY)
Admission: RE | Admit: 2017-10-24 | Discharge: 2017-10-24 | Disposition: A | Discharge status: Home / Self Care | Payer: Medicare Other | Source: Ambulatory Visit | Attending: Hematology | Admitting: Hematology

## 2017-10-24 DIAGNOSIS — R911 Solitary pulmonary nodule: Secondary | ICD-10-CM | POA: Diagnosis not present

## 2017-10-24 DIAGNOSIS — I700 Atherosclerosis of aorta: Secondary | ICD-10-CM | POA: Diagnosis not present

## 2017-10-24 DIAGNOSIS — R918 Other nonspecific abnormal finding of lung field: Secondary | ICD-10-CM | POA: Diagnosis not present

## 2017-10-25 ENCOUNTER — Other Ambulatory Visit (INDEPENDENT_AMBULATORY_CARE_PROVIDER_SITE_OTHER): Payer: Medicare Other

## 2017-10-25 ENCOUNTER — Ambulatory Visit (INDEPENDENT_AMBULATORY_CARE_PROVIDER_SITE_OTHER): Payer: Medicare Other | Admitting: Internal Medicine

## 2017-10-25 ENCOUNTER — Encounter: Payer: Self-pay | Admitting: Internal Medicine

## 2017-10-25 VITALS — BP 126/70 | HR 81 | Ht 63.0 in | Wt 173.0 lb

## 2017-10-25 DIAGNOSIS — Z8674 Personal history of sudden cardiac arrest: Secondary | ICD-10-CM | POA: Diagnosis not present

## 2017-10-25 DIAGNOSIS — D649 Anemia, unspecified: Secondary | ICD-10-CM

## 2017-10-25 DIAGNOSIS — N189 Chronic kidney disease, unspecified: Secondary | ICD-10-CM

## 2017-10-25 DIAGNOSIS — J189 Pneumonia, unspecified organism: Secondary | ICD-10-CM

## 2017-10-25 DIAGNOSIS — R042 Hemoptysis: Secondary | ICD-10-CM

## 2017-10-25 DIAGNOSIS — Z8709 Personal history of other diseases of the respiratory system: Secondary | ICD-10-CM | POA: Diagnosis not present

## 2017-10-25 DIAGNOSIS — R5381 Other malaise: Secondary | ICD-10-CM

## 2017-10-25 DIAGNOSIS — R0489 Hemorrhage from other sites in respiratory passages: Secondary | ICD-10-CM

## 2017-10-25 LAB — BASIC METABOLIC PANEL
BUN: 52 mg/dL — ABNORMAL HIGH (ref 6–23)
CHLORIDE: 103 meq/L (ref 96–112)
CO2: 30 meq/L (ref 19–32)
Calcium: 10.3 mg/dL (ref 8.4–10.5)
Creatinine, Ser: 2.2 mg/dL — ABNORMAL HIGH (ref 0.40–1.20)
GFR: 28.35 mL/min — ABNORMAL LOW (ref >60.00)
Glucose, Bld: 182 mg/dL — ABNORMAL HIGH (ref 70–99)
POTASSIUM: 4.7 meq/L (ref 3.5–5.1)
Sodium: 140 mEq/L (ref 135–145)

## 2017-10-25 LAB — CBC WITH DIFFERENTIAL/PLATELET
BASOS PCT: 0.3 % (ref 0.0–3.0)
Basophils Absolute: 0 10*3/uL (ref 0.0–0.1)
EOS PCT: 1 % (ref 0.0–5.0)
Eosinophils Absolute: 0.1 10*3/uL (ref 0.0–0.7)
HEMATOCRIT: 30.8 % — AB (ref 36.0–46.0)
HEMOGLOBIN: 10.2 g/dL — AB (ref 12.0–15.0)
Lymphocytes Relative: 28 % (ref 12.0–46.0)
Lymphs Abs: 2.6 10*3/uL (ref 0.7–4.0)
MCHC: 33.1 g/dL (ref 30.0–36.0)
MCV: 82.8 fl (ref 78.0–100.0)
Monocytes Absolute: 0.5 10*3/uL (ref 0.1–1.0)
Monocytes Relative: 5.3 % (ref 3.0–12.0)
NEUTROS ABS: 6.1 10*3/uL (ref 1.4–7.7)
Neutrophils Relative %: 65.4 % (ref 43.0–77.0)
PLATELETS: 411 10*3/uL — AB (ref 150.0–400.0)
RBC: 3.72 Mil/uL — ABNORMAL LOW (ref 3.87–5.11)
RDW: 16.3 % — ABNORMAL HIGH (ref 11.5–15.5)
WBC: 9.3 10*3/uL (ref 4.0–10.5)

## 2017-10-25 NOTE — Progress Notes (Signed)
Subjective:     Patient ID: [name], female   DOB: 1945/12/21, 72 y.o.   MRN: [id number]  HPI     OV 10/25/2017  Chief Complaint  Patient presents with  . Hospitalization Follow-up    Pt was in the hospital back in May 2019 for PNA. She saw MR in the hospital and wanted to follow up with him in office. Per patient, she has been feeling ok.     [name]  Presents with daughter who is APP at [facility]. I briefly new patient in May 2019 when she was hospitalized post cardiac arrest. She was admitted for left sided pneumonia and then in the aftermath of the cardiac arrest had ARDS and also alveolar hemorrhage. Autoimmuneand vasculitis panel was negative. It appears she survive the hospitalization and she presents for follow-up today. According to the daughter and the patient she has no recollection of the admission but she has good memory preadmission and post admission. She spent some time in inpatient rehabilitation and had home physical therapy. She reports significant improvement in physical conditioning although she does still have some shortness of breath with exertion especially for stairs and relieved by rest. According to the daughter pulse oximetry checked during physical therapy and rehabilitation was normal. This no cough or wheezing. Her fatigue is significantly improved. She is doing all her activities of daily living. 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  Review of recent CT imaging August 2019 personally visualized shows left upper lobe density very likely resolving infiltrates from the alveolar hemorrhage but this requires follow-up. Recent labs indicate persistent anemia and worsening creatinine from just over a week ago.   Results for [name] (MRN [id number]) as of 10/25/2017 11:59  Ref. Range 09/18/2017 07:10 10/16/2017 10:52  Creatinine Latest Ref Range: 0.44 - 1.00 mg/dL  1.12 (H) 1.95 (H)  Results for [name] (MRN [id number]) as of 10/25/2017 11:59  Ref. Range 09/18/2017 07:10 10/16/2017 10:52  Hemoglobin Latest Ref Range: 11.6 - 15.9 g/dL 9.8 (L) 9.3 (L)       has a past medical history of Abdominal discomfort, Adnexal mass (8/03), Allergy, Anemia, Breast cancer ([place]) (03/16/13), CAD (coronary artery disease) (1996), CHF (congestive heart failure) ([place]), Chronic kidney disease, Chronic pain, Diabetes mellitus, radiation therapy (06/02/13- 07/16/13), Hyperlipidemia, Hypertension, Hypoxia (07/23/2017), Lupus ([place]), Personal history of radiation therapy, RBBB, SBO (small bowel obstruction) ([place]) (09/17/2017), Stroke ([place]), and Wears dentures.   reports that she has never smoked. She has never used smokeless tobacco.  Past Surgical History:  Procedure Laterality Date  . ABDOMINAL HYSTERECTOMY    . BILATERAL SALPINGOOPHORECTOMY  8/03   Lap BSO (R ovarian fibroma) and adhesion lysis  . BOWEL RESECTION  2003   ileocolectomy with anastomosis 2/2 adhesions  . BREAST BIOPSY    . BREAST LUMPECTOMY Right   . BREAST LUMPECTOMY WITH NEEDLE LOCALIZATION AND AXILLARY SENTINEL LYMPH NODE BX Right 04/22/2013   Procedure: BREAST LUMPECTOMY WITH NEEDLE LOCALIZATION AND AXILLARY SENTINEL LYMPH NODE BX;  Surgeon: Stark Klein, MD;  Location: [facility];  Service: General;  Laterality: Right;  . CARDIAC CATHETERIZATION     2 stents  . CHOLECYSTECTOMY    . HEMICOLECTOMY     R sided hemicolectomy  . HERNIA REPAIR     Ventral hernia repair  . PTCA  4/06    Allergies  Allergen  Reactions  . Percocet [Oxycodone-Acetaminophen] Other (See Comments)    headaches  . Diazepam Other (See Comments)    REACTION: Agitation  . Haloperidol Lactate Nausea And Vomiting  . Lorazepam Nausea Only  . Morphine Sulfate Other (See Comments)    REACTION: Agitation  . Propoxyphene Hcl Nausea And Vomiting  . Tramadol Hcl Nausea And Vomiting  . Anesthetics, Halogenated     Per  patient's daughter, she can not tolerate anesthetics.     Immunization History  Administered Date(s) Administered  . Influenza Split 12/13/2010, 12/11/2011  . Influenza Whole 01/06/2009, 02/07/2010  . Influenza,inj,Quad PF,6+ Mos 01/06/2013, 12/17/2013, 01/06/2015, 12/19/2016  . PPD Test 01/02/2017  . Pneumococcal Conjugate-13 10/01/2013  . Pneumococcal Polysaccharide-23 06/02/2009, 10/28/2014  . Td 04/28/2009    Family History  Problem Relation Age of Onset  . Pancreatic cancer Brother 49  . Cancer Mother   . Breast cancer Mother 76  . Lung cancer Maternal Aunt   . Breast cancer Maternal Aunt   . Prostate cancer Maternal Uncle   . Heart attack Maternal Grandmother   . Breast cancer Maternal Grandmother   . Kidney failure Maternal Aunt   . Hypertension Daughter      Current Outpatient Medications:  .  acetaminophen (TYLENOL) 325 MG tablet, Take 650 mg by mouth every 6 (six) hours as needed for mild pain., Disp: , Rfl:  .  amLODipine (NORVASC) 10 MG tablet, Take 1 tablet (10 mg total) by mouth daily., Disp: 30 tablet, Rfl: 0 .  atorvastatin (LIPITOR) 40 MG tablet, Take 1 tablet (40 mg total) by mouth at bedtime., Disp: 90 tablet, Rfl: 3 .  carvedilol (COREG) 25 MG tablet, Take 25 mg by mouth as directed., Disp: , Rfl:  .  desloratadine (CLARINEX) 5 MG tablet, Take 5 mg by mouth at bedtime as needed (allergies). Reported on 04/11/2015, Disp: , Rfl:  .  fenofibrate 54 MG tablet, Take 54 mg by mouth daily. , Disp: , Rfl:  .  ferrous fumarate (HEMOCYTE - 106 MG FE) 325 (106 Fe) MG TABS tablet, Take 1 tablet by mouth as directed., Disp: , Rfl:  .  furosemide (LASIX) 40 MG tablet, Take 40 mg by mouth as directed., Disp: , Rfl:  .  gabapentin (NEURONTIN) 100 MG capsule, Take 100 mg by mouth at bedtime., Disp: , Rfl:  .  linagliptin (TRADJENTA) 5 MG TABS tablet, Take 5 mg by mouth daily., Disp: , Rfl:  .  methocarbamol (ROBAXIN) 500 MG tablet, Take 1 tablet (500 mg total) by mouth every  8 (eight) hours as needed for muscle spasms., Disp: 60 tablet, Rfl: 1 .  nitroGLYCERIN (NITROSTAT) 0.4 MG SL tablet, Place 1 tablet (0.4 mg total) under the tongue every 5 (five) minutes as needed for chest pain., Disp: 25 tablet, Rfl: 3 .  pantoprazole (PROTONIX) 40 MG tablet, Take 1 tablet (40 mg total) by mouth daily., Disp: 30 tablet, Rfl: 3 .  potassium chloride SA (K-DUR,KLOR-CON) 20 MEQ tablet, Take 1 tablet (20 mEq total) by mouth daily as needed (when taking lasix)., Disp: 30 tablet, Rfl: 1 .  rivaroxaban (XARELTO) 20 MG TABS tablet, Take 1 tablet (20 mg total) by mouth daily with supper., Disp: 30 tablet, Rfl: 4 .  ULORIC 40 MG tablet, Take 1 tablet by mouth daily., Disp: , Rfl:  No current facility-administered medications for this visit.   Facility-Administered Medications Ordered in Other Visits:  .  0.9 %  sodium chloride infusion, , Intravenous, Continuous, Burgess Estelle, MD, Last  Rate: 150 mL/hr at 10/03/15 1607    Review of Systems     Objective:   Physical Exam  Constitutional: She is oriented to person, place, and time. She appears well-developed and well-nourished. No distress.  HENT:  Head: Normocephalic and atraumatic.  Right Ear: External ear normal.  Left Ear: External ear normal.  Mouth/Throat: Oropharynx is clear and moist. No oropharyngeal exudate.  Eyes: Pupils are equal, round, and reactive to light. Conjunctivae and EOM are normal. Right eye exhibits no discharge. Left eye exhibits no discharge. No scleral icterus.  Neck: Normal range of motion. Neck supple. No JVD present. No tracheal deviation present. No thyromegaly present.  Cardiovascular: Normal rate, regular rhythm, normal heart sounds and intact distal pulses. Exam reveals no gallop and no friction rub.  No murmur heard. Pulmonary/Chest: Effort normal and breath sounds normal. No respiratory distress. She has no wheezes. She has no rales. She exhibits no tenderness.  Abdominal: Soft. Bowel sounds  are normal. She exhibits no distension and no mass. There is no tenderness. There is no rebound and no guarding.  Musculoskeletal: Normal range of motion. She exhibits edema. She exhibits no tenderness.  + edema  Lymphadenopathy:    She has no cervical adenopathy.  Neurological: She is alert and oriented to person, place, and time. She has normal reflexes. No cranial nerve deficit. She exhibits normal muscle tone. Coordination normal.  Skin: Skin is warm and dry. No rash noted. She is not diaphoretic. No erythema. No pallor.  Psychiatric: She has a normal mood and affect. Her behavior is normal. Judgment and thought content normal.  Vitals reviewed.  Vitals:   10/25/17 1131  BP: 126/70  Pulse: 81  SpO2: 97%  Weight: 173 lb (78.5 kg)  Height: 5' 3"  (1.6 m)    Estimated body mass index is 30.65 kg/m as calculated from the following:   Height as of this encounter: 5' 3"  (1.6 m).   Weight as of this encounter: 173 lb (78.5 kg).     Assessment:     1. Pneumonia of left lung due to infectious organism, unspecified part of lung  2. Diffuse pulmonary alveolar hemorrhage  3. ARDS survivor  4. H/O cardiac arrest  5. Chronic kidney disease, unspecified CKD stage  6. Anemia, unspecified type  7. Physical deconditioning     Plan:     Pneumonia of left lung due to infectious organism, unspecified part of lung Diffuse pulmonary alveolar hemorrhage ARDS survivor H/O cardiac arrest  - repeat CT chest without contrast in 3 month  Chronic kidney disease, unspecified CKD stage - do bmet 10/25/2017   Anemia, unspecified type - do cbc 10/25/2017 - continue iron per PCP Seward Carol, MD   Deconditioning  -glad PCP ordered repeat PT  Followup 3 months but after repoat CT    Dr. Brand Males, M.D., [facility].C.P Pulmonary and Critical Care Medicine Staff Physician, [place] Director - Interstitial Lung Disease  Program  Pulmonary [place] at [place], [place], [address]  Pager: [phone], If no answer or between  15:00h - 7:00h: call [phone] Telephone: [phone]

## 2017-10-25 NOTE — Patient Instructions (Addendum)
Pneumonia of left lung due to infectious organism, unspecified part of lung Diffuse pulmonary alveolar hemorrhage ARDS survivor H/O cardiac arrest  - repeat CT chest without contrast in 3 month  Chronic kidney disease, unspecified CKD stage - do bmet 10/25/2017   Anemia, unspecified type - do cbc 10/25/2017 - continue iron per PCP Seward Carol, MD   Deconditioning  -glad PCP ordered repeat PT  Followup 3 months but after repoat CT

## 2017-10-26 ENCOUNTER — Other Ambulatory Visit: Payer: Self-pay | Admitting: Hematology

## 2017-10-30 ENCOUNTER — Ambulatory Visit: Payer: Medicare Other | Admitting: Physical Therapy

## 2017-11-04 DIAGNOSIS — I4510 Unspecified right bundle-branch block: Secondary | ICD-10-CM | POA: Diagnosis not present

## 2017-11-04 DIAGNOSIS — E1122 Type 2 diabetes mellitus with diabetic chronic kidney disease: Secondary | ICD-10-CM | POA: Diagnosis not present

## 2017-11-04 DIAGNOSIS — Z8673 Personal history of transient ischemic attack (TIA), and cerebral infarction without residual deficits: Secondary | ICD-10-CM | POA: Diagnosis not present

## 2017-11-04 DIAGNOSIS — I130 Hypertensive heart and chronic kidney disease with heart failure and stage 1 through stage 4 chronic kidney disease, or unspecified chronic kidney disease: Secondary | ICD-10-CM | POA: Diagnosis not present

## 2017-11-04 DIAGNOSIS — Z853 Personal history of malignant neoplasm of breast: Secondary | ICD-10-CM | POA: Diagnosis not present

## 2017-11-04 DIAGNOSIS — I5022 Chronic systolic (congestive) heart failure: Secondary | ICD-10-CM | POA: Diagnosis not present

## 2017-11-04 DIAGNOSIS — E785 Hyperlipidemia, unspecified: Secondary | ICD-10-CM | POA: Diagnosis not present

## 2017-11-04 DIAGNOSIS — I2510 Atherosclerotic heart disease of native coronary artery without angina pectoris: Secondary | ICD-10-CM | POA: Diagnosis not present

## 2017-11-04 DIAGNOSIS — E1142 Type 2 diabetes mellitus with diabetic polyneuropathy: Secondary | ICD-10-CM | POA: Diagnosis not present

## 2017-11-04 DIAGNOSIS — G8929 Other chronic pain: Secondary | ICD-10-CM | POA: Diagnosis not present

## 2017-11-04 DIAGNOSIS — M109 Gout, unspecified: Secondary | ICD-10-CM | POA: Diagnosis not present

## 2017-11-04 DIAGNOSIS — Z7901 Long term (current) use of anticoagulants: Secondary | ICD-10-CM | POA: Diagnosis not present

## 2017-11-04 DIAGNOSIS — N183 Chronic kidney disease, stage 3 (moderate): Secondary | ICD-10-CM | POA: Diagnosis not present

## 2017-11-04 DIAGNOSIS — K862 Cyst of pancreas: Secondary | ICD-10-CM | POA: Diagnosis not present

## 2017-11-04 DIAGNOSIS — I872 Venous insufficiency (chronic) (peripheral): Secondary | ICD-10-CM | POA: Diagnosis not present

## 2017-11-04 DIAGNOSIS — D631 Anemia in chronic kidney disease: Secondary | ICD-10-CM | POA: Diagnosis not present

## 2017-11-04 DIAGNOSIS — I823 Embolism and thrombosis of renal vein: Secondary | ICD-10-CM | POA: Diagnosis not present

## 2017-11-04 DIAGNOSIS — R1310 Dysphagia, unspecified: Secondary | ICD-10-CM | POA: Diagnosis not present

## 2017-11-04 DIAGNOSIS — Z7984 Long term (current) use of oral hypoglycemic drugs: Secondary | ICD-10-CM | POA: Diagnosis not present

## 2017-11-04 DIAGNOSIS — Z9181 History of falling: Secondary | ICD-10-CM | POA: Diagnosis not present

## 2017-11-06 ENCOUNTER — Ambulatory Visit: Payer: Medicare Other | Admitting: Physical Therapy

## 2017-11-12 DIAGNOSIS — K625 Hemorrhage of anus and rectum: Secondary | ICD-10-CM | POA: Diagnosis not present

## 2017-11-12 DIAGNOSIS — L0292 Furuncle, unspecified: Secondary | ICD-10-CM | POA: Diagnosis not present

## 2017-11-19 ENCOUNTER — Encounter: Payer: Self-pay | Admitting: Physical Therapy

## 2017-11-19 ENCOUNTER — Ambulatory Visit: Payer: Medicare Other | Attending: Physical Medicine & Rehabilitation | Admitting: Physical Therapy

## 2017-11-19 DIAGNOSIS — G8929 Other chronic pain: Secondary | ICD-10-CM | POA: Diagnosis not present

## 2017-11-19 DIAGNOSIS — R29898 Other symptoms and signs involving the musculoskeletal system: Secondary | ICD-10-CM | POA: Diagnosis not present

## 2017-11-19 DIAGNOSIS — R262 Difficulty in walking, not elsewhere classified: Secondary | ICD-10-CM | POA: Insufficient documentation

## 2017-11-19 DIAGNOSIS — M5441 Lumbago with sciatica, right side: Secondary | ICD-10-CM | POA: Insufficient documentation

## 2017-11-19 NOTE — Therapy (Signed)
[place], [place], [address] Phone: [phone]   Fax:  [phone]  Physical Therapy Evaluation  Patient Details  Name: [name] MRN: [id number] Date of Birth: February 16, 1946 Referring Provider: Daleen Squibb MD   Encounter Date: 11/19/2017  PT End of Session - 11/19/17 2041    Visit Number  1    Number of Visits  10    Date for PT Re-Evaluation  12/24/17    Authorization Type  [employer] MCR and secondary is champ [facility], no authorization required or visit limit    PT Start Time  1630    PT Stop Time  1710    PT Time Calculation (min)  40 min    Activity Tolerance  Patient tolerated treatment well    Behavior During Therapy  [facility] for tasks assessed/performed       Past Medical History:  Diagnosis Date  . Abdominal discomfort    Chronic N/V/D. Presumptive dx Crohn's dx per elevated p ANCA. Failed Entocort and Pentasa. Sep 2003 - ileocolectomy c anastomosis per Dr Deon Pilling 2/2 adhesions - path was hegative for Crohns. EGD, Sm bowel follow through (11/03), and an eteroclysis (10/03) were unrevealing. Cuases hypomag and hypocalcemia.  . Adnexal mass 8/03   s/p lap BSO (R ovarian fibroma) & lysis of adhesions  . Allergy    Seasonal  . Anemia    Multifactorial. Baseline HgB 10-11 ish. B12 def - 150 in 3/10. Fe Def - ferritin 35 3/10. Both are being repleted.  . Breast cancer ([place]) 03/16/13   right, 5 o'clock  . CAD (coronary artery disease) 1996   1996 - PTCA and angioplasty diagonal branch. 2000 - Rotoblator & angiopllasty of diagonal. 2006 - subendocardial AMI, DES to proximal LAD.Marland Kitchen Also had 90% stenosis in distal apical LAD. EF 55 with apical hypokinesis. Indefinite ASA and Plavix.  . CHF (congestive heart failure) ([place])   . Chronic kidney disease    Chronic renal insuff baseline Cr 1.2 - 1.4 ish.  . Chronic pain    CT 10/10 = Spinal stenosis L2 - S1.  . Diabetes mellitus    Insulin dependent  . Hx of radiation  therapy 06/02/13- 07/16/13   right rbeast 4500 cGy 25 sessions, right breast boost 1600 cGy in 8 sessions  . Hyperlipidemia    Managed with both a statin and Welchol. Welchol stopped 2014 2/2 cost and started on fenofibrate   . Hypertension    2006 B renal arteries patent. 2003 MRA - no RAS. 2003 pheo W/U Dr Hassell Done reportedly negative.  . Hypoxia 07/23/2017  . Lupus ([place])   . Personal history of radiation therapy   . RBBB   . SBO (small bowel obstruction) ([place]) 09/17/2017  . Stroke [facility])    Incidental finding MRI 2002 L lacunar infarct  . Wears dentures    top    Past Surgical History:  Procedure Laterality Date  . ABDOMINAL HYSTERECTOMY    . BILATERAL SALPINGOOPHORECTOMY  8/03   Lap BSO (R ovarian fibroma) and adhesion lysis  . BOWEL RESECTION  2003   ileocolectomy with anastomosis 2/2 adhesions  . BREAST BIOPSY    . BREAST LUMPECTOMY Right   . BREAST LUMPECTOMY WITH NEEDLE LOCALIZATION AND AXILLARY SENTINEL LYMPH NODE BX Right 04/22/2013   Procedure: BREAST LUMPECTOMY WITH NEEDLE LOCALIZATION AND AXILLARY SENTINEL LYMPH NODE BX;  Surgeon: Stark Klein, MD;  Location: [facility];  Service: General;  Laterality: Right;  . CARDIAC CATHETERIZATION  2 stents  . CHOLECYSTECTOMY    . HEMICOLECTOMY     R sided hemicolectomy  . HERNIA REPAIR     Ventral hernia repair  . PTCA  4/06    There were no vitals filed for this visit.   Subjective Assessment - 11/19/17 2028    Subjective  Pt relays chronic Rt sided LBP with radiculopathy. She relays previous back surgery to remove disc several years ago. She has had 1 fall in last 6 months, she feels unsteady and shifted. She had recent hospitilzation for PNA and ended up staying over a month including inpatient rehab and she still trying to get her conditioning back.    Pertinent History  PMH: Anemia, Breast cancer, CAD,CHF,MI, Chronic kidney disease, Chronic pain, DM, radiation therapy (06/02/13- 07/16/13), Hyperlipidemia, HTN,  Hypoxia. SOB    Limitations  Sitting;Lifting;Standing;Walking;House hold activities    How long can you sit comfortably?  10-15 min    How long can you stand comfortably?  15 min    How long can you walk comfortably?  10 min    Diagnostic tests  x-rays reveal degenerative disc disease at L2-L3 L3-L4 and L4-L5.  -L4-L5appear to be most affected.    Patient Stated Goals  get rid of pain and stiffness    Currently in Pain?  Yes    Pain Score  7     Pain Location  Back    Pain Orientation  Right    Pain Descriptors / Indicators  Aching;Tightness;Numbness;Burning    Pain Type  Chronic pain    Pain Radiating Towards  Rt LE to knee    Pain Onset  More than a month ago    Pain Frequency  Intermittent    Aggravating Factors   prolonged sitting, standing, walking, bending    Pain Relieving Factors  rest    Multiple Pain Sites  No         [facility] PT Assessment - 11/19/17 0001      Assessment   Medical Diagnosis  Rt LBP with radic,Pnemonia Lt lung and deconditioning     Referring Provider  Daleen Squibb MD    Onset Date/Surgical Date  --   chronic   Next MD Visit  Nov    Prior Therapy  inpatient rehab      Precautions   Precautions  None      Balance Screen   Has the patient fallen in the past 6 months  Yes    How many times?  1    Has the patient had a decrease in activity level because of a fear of falling?   No    Is the patient reluctant to leave their home because of a fear of falling?   No      Home Film/video editor residence    Additional Comments  lives with roomate      Prior Function   Level of Independence  Independent with basic ADLs      Cognition   Overall Cognitive Status  Within Functional Limits for tasks assessed      Observation/Other Assessments   Focus on Therapeutic Outcomes (FOTO)   62%limited      Sensation   Light Touch  Appears Intact      Posture/Postural Control   Posture Comments  Rt shoulders shifted with hips offset to  Lt      ROM / Strength   AROM / PROM / Strength  AROM;Strength  AROM   Overall AROM Comments  pain with all planes except rotation and SB to Rt    AROM Assessment Site  Lumbar    Lumbar Flexion  75%    Lumbar Extension  75%    Lumbar - Right Side Bend  90%    Lumbar - Left Side Bend  50%    Lumbar - Right Rotation  75%    Lumbar - Left Rotation  75%      Strength   Overall Strength Comments  Rt LE 4/5 MMT and Lt 4+/5 MMT grossly      Flexibility   Soft Tissue Assessment /Muscle Length  --   tight H.S, glutes, quads, lumbar P.S Rt>Lt     Palpation   Spinal mobility  mild hypomobility    Palpation comment  TTP Rt lumbar P.S       Special Tests   Other special tests  +SLR and FABERS on Rt, neg on lt, inconclusive SI jt testing, neg slump bilat      Ambulation/Gait   Gait Comments  slightly antalgic, mild unsteadiness      Balance   Balance Assessed  No      Standardized Balance Assessment   Standardized Balance Assessment  --   Plan for DGI/FGA next visit               Objective measurements completed on examination: See above findings.      [facility] Adult PT Treatment/Exercise - 11/19/17 0001      Modalities   Modalities  Electrical Stimulation;Moist Heat      Moist Heat Therapy   Number Minutes Moist Heat  10 Minutes   with HEP and POC edu   Moist Heat Location  Lumbar Spine      Electrical Stimulation   Electrical Stimulation Location  lumbar    Electrical Stimulation Action  IFC    Electrical Stimulation Parameters  tolerance    Electrical Stimulation Goals  Tone;Pain                  PT Long Term Goals - 11/19/17 2048      PT LONG TERM GOAL #1   Title  Pt will be I and compliant with HEP. 5 weeks 12/24/17    Status  New      PT LONG TERM GOAL #2   Title  Pt will improve FOTO to less than 55% limited. 5 weeks 12/24/17    Status  New      PT LONG TERM GOAL #3   Title  Pt will improve lumbar ROM to [facility]. 5 weeks 12/24/17       PT LONG TERM GOAL #4   Title  Pt will improve Rt LE strength to overall 4+/5 to 5-/5 grossly. 5 weeks 12/24/17      PT LONG TERM GOAL #5   Title  Pt will decrease overall pain to less than 3/10 with usual activity. 5 weeks 12/24/17             Plan - 11/19/17 2043    Clinical Impression Statement  Pt presents with chronic Rt sided LBP with radiculopathy. x-rays show DDD most affected at L4-5. She had previous lumbar disectomy per pt report. She has decreased ROM, decreased spinal mobility, gait instability, deconditioning, increased lumbar tightness and pain Rt>Lt, decreased LE strength, and increased pain limiting her function. She will benefit from skilled PT to address these deficits.     Clinical Presentation  Evolving  Clinical Presentation due to:  multiple co morbidiites and deconditioning    Clinical Decision Making  Moderate    Rehab Potential  Good    Clinical Impairments Affecting Rehab Potential  multiple co morbidiites and deconditioning, pnemonia    PT Frequency  2x / week    PT Duration  --   5 weeks, pt will miss next week, out of town   PT Treatment/Interventions  Cryotherapy;Electrical Stimulation;Iontophoresis 8m/ml Dexamethasone;Moist Heat;Traction;Ultrasound;Therapeutic activities;Therapeutic exercise;Balance training;Neuromuscular re-education;Manual techniques;Passive range of motion;Dry needling;Vasopneumatic Device;Taping    PT Next Visit Plan  DGI or FGA to assess balance, LE strength, lumbar stretching    Consulted and Agree with Plan of Care  Patient       Patient will benefit from skilled therapeutic intervention in order to improve the following deficits and impairments:  Abnormal gait, Decreased activity tolerance, Decreased balance, Decreased endurance, Decreased range of motion, Difficulty walking, Decreased strength, Hypomobility, Increased fascial restricitons, Increased muscle spasms, Impaired flexibility, Pain, Postural dysfunction  Visit  Diagnosis: Chronic right-sided low back pain with right-sided sciatica  Difficulty in walking, not elsewhere classified  Muscular deconditioning     Problem List Patient Active Problem List   Diagnosis Date Noted  . Renal vein thrombosis ([place] 09/17/2017  . Hoarse voice quality 09/09/2017  . Low back pain without sciatica 09/09/2017  . Labile blood glucose   . Sleep disturbance   . Leukocytosis   . Essential hypertension   . Urinary frequency   . Type 2 diabetes mellitus with peripheral neuropathy (HCC)   . Benign essential HTN   . Stage 2 chronic kidney disease   . Dysphagia   . Debility   . Encephalopathy   . Pressure injury of skin 08/04/2017  . Pneumonia of left lung due to infectious organism   . Diffuse pulmonary alveolar hemorrhage   . SOB (shortness of breath) 10/11/2016  . Hyperparathyroidism, secondary renal ([place] 05/02/2016  . Gout 04/11/2015  . Diabetic neuropathy associated with type 2 diabetes mellitus ([place] 01/06/2015  . SBO (small bowel obstruction) ([place] 09/15/2013  . Breast cancer of lower-inner quadrant of right female breast ([place] 03/18/2013  . Chronic venous insufficiency 01/06/2013  . Health care maintenance 05/08/2011  . Diabetes mellitus type 2 in obese ([place] 05/03/2010  . Seasonal allergies 05/03/2010  . Labile hypertension   . CAD (coronary artery disease)   . Abdominal discomfort   . Anemia in chronic kidney disease   . History of stroke without residual deficits   . Hyperlipidemia associated with type 2 diabetes mellitus ([place]   . Chronic pain   . Chronic kidney disease, stage III (moderate) (HCC)   . Adnexal mass     BDebbe Odea PT, DPT 11/19/2017, 8:52 PM  [facility] [address]Phone: [phone]  Fax:  [phone] Name: MJENSINE LUZMRN: [id number] of Birth: 4November 13, 1947

## 2017-11-22 DIAGNOSIS — I5022 Chronic systolic (congestive) heart failure: Secondary | ICD-10-CM | POA: Diagnosis not present

## 2017-11-22 DIAGNOSIS — M109 Gout, unspecified: Secondary | ICD-10-CM | POA: Diagnosis not present

## 2017-11-22 DIAGNOSIS — K862 Cyst of pancreas: Secondary | ICD-10-CM | POA: Diagnosis not present

## 2017-11-22 DIAGNOSIS — G8929 Other chronic pain: Secondary | ICD-10-CM | POA: Diagnosis not present

## 2017-11-22 DIAGNOSIS — I2510 Atherosclerotic heart disease of native coronary artery without angina pectoris: Secondary | ICD-10-CM | POA: Diagnosis not present

## 2017-11-22 DIAGNOSIS — E785 Hyperlipidemia, unspecified: Secondary | ICD-10-CM | POA: Diagnosis not present

## 2017-11-22 DIAGNOSIS — I130 Hypertensive heart and chronic kidney disease with heart failure and stage 1 through stage 4 chronic kidney disease, or unspecified chronic kidney disease: Secondary | ICD-10-CM | POA: Diagnosis not present

## 2017-11-22 DIAGNOSIS — E1122 Type 2 diabetes mellitus with diabetic chronic kidney disease: Secondary | ICD-10-CM | POA: Diagnosis not present

## 2017-11-22 DIAGNOSIS — Z8673 Personal history of transient ischemic attack (TIA), and cerebral infarction without residual deficits: Secondary | ICD-10-CM | POA: Diagnosis not present

## 2017-11-22 DIAGNOSIS — I823 Embolism and thrombosis of renal vein: Secondary | ICD-10-CM | POA: Diagnosis not present

## 2017-11-22 DIAGNOSIS — Z7901 Long term (current) use of anticoagulants: Secondary | ICD-10-CM | POA: Diagnosis not present

## 2017-11-22 DIAGNOSIS — Z853 Personal history of malignant neoplasm of breast: Secondary | ICD-10-CM | POA: Diagnosis not present

## 2017-11-22 DIAGNOSIS — I4510 Unspecified right bundle-branch block: Secondary | ICD-10-CM | POA: Diagnosis not present

## 2017-11-22 DIAGNOSIS — D631 Anemia in chronic kidney disease: Secondary | ICD-10-CM | POA: Diagnosis not present

## 2017-11-22 DIAGNOSIS — N183 Chronic kidney disease, stage 3 (moderate): Secondary | ICD-10-CM | POA: Diagnosis not present

## 2017-11-22 DIAGNOSIS — E1142 Type 2 diabetes mellitus with diabetic polyneuropathy: Secondary | ICD-10-CM | POA: Diagnosis not present

## 2017-11-22 DIAGNOSIS — R1310 Dysphagia, unspecified: Secondary | ICD-10-CM | POA: Diagnosis not present

## 2017-11-22 DIAGNOSIS — Z7984 Long term (current) use of oral hypoglycemic drugs: Secondary | ICD-10-CM | POA: Diagnosis not present

## 2017-11-22 DIAGNOSIS — Z9181 History of falling: Secondary | ICD-10-CM | POA: Diagnosis not present

## 2017-11-22 DIAGNOSIS — I872 Venous insufficiency (chronic) (peripheral): Secondary | ICD-10-CM | POA: Diagnosis not present

## 2017-12-02 ENCOUNTER — Ambulatory Visit: Payer: Medicare Other | Admitting: Physical Therapy

## 2017-12-02 ENCOUNTER — Encounter: Payer: Self-pay | Admitting: Physical Therapy

## 2017-12-02 DIAGNOSIS — R262 Difficulty in walking, not elsewhere classified: Secondary | ICD-10-CM

## 2017-12-02 DIAGNOSIS — K625 Hemorrhage of anus and rectum: Secondary | ICD-10-CM | POA: Diagnosis not present

## 2017-12-02 DIAGNOSIS — G8929 Other chronic pain: Secondary | ICD-10-CM | POA: Diagnosis not present

## 2017-12-02 DIAGNOSIS — R29898 Other symptoms and signs involving the musculoskeletal system: Secondary | ICD-10-CM

## 2017-12-02 DIAGNOSIS — I823 Embolism and thrombosis of renal vein: Secondary | ICD-10-CM | POA: Diagnosis not present

## 2017-12-02 DIAGNOSIS — E538 Deficiency of other specified B group vitamins: Secondary | ICD-10-CM | POA: Diagnosis not present

## 2017-12-02 DIAGNOSIS — Z23 Encounter for immunization: Secondary | ICD-10-CM | POA: Diagnosis not present

## 2017-12-02 DIAGNOSIS — M5441 Lumbago with sciatica, right side: Secondary | ICD-10-CM | POA: Diagnosis not present

## 2017-12-03 ENCOUNTER — Encounter: Payer: Self-pay | Admitting: Physical Therapy

## 2017-12-03 NOTE — Therapy (Signed)
[place], [place], [address] Phone: [phone]   Fax:  [phone]  Physical Therapy Treatment  Patient Details  Name: [name] MRN: [id number] Date of Birth: 1945-06-26 Referring Provider: Daleen Squibb MD   Encounter Date: 12/02/2017  PT End of Session - 12/03/17 0838    Visit Number  2    Number of Visits  10    Date for PT Re-Evaluation  12/24/17    Authorization Type  UHC MCR and secondary is champ [facility], no authorization required or visit limit    PT Start Time  1635    PT Stop Time  1725    PT Time Calculation (min)  50 min    Activity Tolerance  Patient tolerated treatment well    Behavior During Therapy  [facility] for tasks assessed/performed       Past Medical History:  Diagnosis Date  . Abdominal discomfort    Chronic N/V/D. Presumptive dx Crohn's dx per elevated p ANCA. Failed Entocort and Pentasa. Sep 2003 - ileocolectomy c anastomosis per Dr Deon Pilling 2/2 adhesions - path was hegative for Crohns. EGD, Sm bowel follow through (11/03), and an eteroclysis (10/03) were unrevealing. Cuases hypomag and hypocalcemia.  . Adnexal mass 8/03   s/p lap BSO (R ovarian fibroma) & lysis of adhesions  . Allergy    Seasonal  . Anemia    Multifactorial. Baseline HgB 10-11 ish. B12 def - 150 in 3/10. Fe Def - ferritin 35 3/10. Both are being repleted.  . Breast cancer ([place]) 03/16/13   right, 5 o'clock  . CAD (coronary artery disease) 1996   1996 - PTCA and angioplasty diagonal branch. 2000 - Rotoblator & angiopllasty of diagonal. 2006 - subendocardial AMI, DES to proximal LAD.Marland Kitchen Also had 90% stenosis in distal apical LAD. EF 55 with apical hypokinesis. Indefinite ASA and Plavix.  . CHF (congestive heart failure) ([place])   . Chronic kidney disease    Chronic renal insuff baseline Cr 1.2 - 1.4 ish.  . Chronic pain    CT 10/10 = Spinal stenosis L2 - S1.  . Diabetes mellitus    Insulin dependent  . Hx of radiation  therapy 06/02/13- 07/16/13   right rbeast 4500 cGy 25 sessions, right breast boost 1600 cGy in 8 sessions  . Hyperlipidemia    Managed with both a statin and Welchol. Welchol stopped 2014 2/2 cost and started on fenofibrate   . Hypertension    2006 B renal arteries patent. 2003 MRA - no RAS. 2003 pheo W/U Dr Hassell Done reportedly negative.  . Hypoxia 07/23/2017  . Lupus ([place])   . Personal history of radiation therapy   . RBBB   . SBO (small bowel obstruction) ([place]) 09/17/2017  . Stroke [facility])    Incidental finding MRI 2002 L lacunar infarct  . Wears dentures    top    Past Surgical History:  Procedure Laterality Date  . ABDOMINAL HYSTERECTOMY    . BILATERAL SALPINGOOPHORECTOMY  8/03   Lap BSO (R ovarian fibroma) and adhesion lysis  . BOWEL RESECTION  2003   ileocolectomy with anastomosis 2/2 adhesions  . BREAST BIOPSY    . BREAST LUMPECTOMY Right   . BREAST LUMPECTOMY WITH NEEDLE LOCALIZATION AND AXILLARY SENTINEL LYMPH NODE BX Right 04/22/2013   Procedure: BREAST LUMPECTOMY WITH NEEDLE LOCALIZATION AND AXILLARY SENTINEL LYMPH NODE BX;  Surgeon: Stark Klein, MD;  Location: [place];  Service: General;  Laterality: Right;  . CARDIAC CATHETERIZATION  2 stents  . CHOLECYSTECTOMY    . HEMICOLECTOMY     R sided hemicolectomy  . HERNIA REPAIR     Ventral hernia repair  . PTCA  4/06    There were no vitals filed for this visit.  Subjective Assessment - 12/02/17 1639    Subjective  Patient reports her pain is OK today. She has been doing her stretches which has helped. She has been to the MD who suggested that she needs a cane.     Pertinent History  PMH: Anemia, Breast cancer, CAD,CHF,MI, Chronic kidney disease, Chronic pain, DM, radiation therapy (06/02/13- 07/16/13), Hyperlipidemia, HTN, Hypoxia. SOB    Limitations  Sitting;Lifting;Standing;Walking;House hold activities    How long can you sit comfortably?  10-15 min    How long can you stand comfortably?  15 min     How long can you walk comfortably?  10 min    Diagnostic tests  x-rays reveal degenerative disc disease at L2-L3 L3-L4 and L4-L5.  -L4-L5appear to be most affected.    Pain Score  3     Pain Location  Back    Pain Orientation  Right    Pain Descriptors / Indicators  Aching    Pain Type  Chronic pain    Pain Onset  More than a month ago    Pain Frequency  Intermittent    Aggravating Factors   prolonged sitting, standing, walking, bending    Pain Relieving Factors  rest                       [facility] Adult PT Treatment/Exercise - 12/03/17 0001      Exercises   Exercises  Lumbar      Lumbar Exercises: Stretches   Active Hamstring Stretch Limitations  seated 2x20 sec hold    Lower Trunk Rotation Limitations  2x10      Lumbar Exercises: Seated   Other Seated Lumbar Exercises  seated clam shell 2x10       Lumbar Exercises: Supine   AB Set Limitations  x10 with mod cuing for technique     Bent Knee Raise Limitations  2x10 with mod cuing for technique       Moist Heat Therapy   Number Minutes Moist Heat  10 Minutes    Moist Heat Location  Lumbar Spine      Manual Therapy   Manual Therapy  Soft tissue mobilization    Soft tissue mobilization  trigger point release and soft tissue mobilization to lumbar spine and glutels              PT Education - 12/03/17 0805    Education Details  reviewed HEPand symptom mangement     Person(s) Educated  Patient    Methods  Explanation;Demonstration;Verbal cues;Tactile cues    Comprehension  Verbalized understanding;Returned demonstration;Verbal cues required;Tactile cues required;Need further instruction          PT Long Term Goals - 11/19/17 2048      PT LONG TERM GOAL #1   Title  Pt will be I and compliant with HEP. 5 weeks 12/24/17    Status  New      PT LONG TERM GOAL #2   Title  Pt will improve FOTO to less than 55% limited. 5 weeks 12/24/17    Status  New      PT LONG TERM GOAL #3   Title  Pt will improve  lumbar ROM to [facility]. 5 weeks 12/24/17  PT LONG TERM GOAL #4   Title  Pt will improve Rt LE strength to overall 4+/5 to 5-/5 grossly. 5 weeks 12/24/17      PT LONG TERM GOAL #5   Title  Pt will decrease overall pain to less than 3/10 with usual activity. 5 weeks 12/24/17            Plan - 12/03/17 0806    Clinical Impression Statement  Patient had some pain with supine march. her hip flexion motion is imited. Therapy attmepted posterior hip mobs and inferior glides but she had a hard time relaxing. She had a large amount of spasming in her lumbar spine and into her upper glutes. Theapy advised her to continue with current HEP. She required too much cuing for her new exercises to do them at home.     Clinical Presentation  Evolving    Clinical Decision Making  Moderate    Clinical Impairments Affecting Rehab Potential  multiple co morbidiites and deconditioning, pnemonia    PT Frequency  2x / week    PT Duration  4 weeks    PT Treatment/Interventions  Cryotherapy;Electrical Stimulation;Iontophoresis 16m/ml Dexamethasone;Moist Heat;Traction;Ultrasound;Therapeutic activities;Therapeutic exercise;Balance training;Neuromuscular re-education;Manual techniques;Passive range of motion;Dry needling;Vasopneumatic Device;Taping    PT Next Visit Plan  DGI or FGA to assess balance, LE strength, lumbar stretching    Consulted and Agree with Plan of Care  Patient       Patient will benefit from skilled therapeutic intervention in order to improve the following deficits and impairments:  Abnormal gait, Decreased activity tolerance, Decreased balance, Decreased endurance, Decreased range of motion, Difficulty walking, Decreased strength, Hypomobility, Increased fascial restricitons, Increased muscle spasms, Impaired flexibility, Pain, Postural dysfunction  Visit Diagnosis: Chronic right-sided low back pain with right-sided sciatica  Difficulty in walking, not elsewhere classified  Muscular  deconditioning     Problem List Patient Active Problem List   Diagnosis Date Noted  . Renal vein thrombosis ([place] 09/17/2017  . Hoarse voice quality 09/09/2017  . Low back pain without sciatica 09/09/2017  . Labile blood glucose   . Sleep disturbance   . Leukocytosis   . Essential hypertension   . Urinary frequency   . Type 2 diabetes mellitus with peripheral neuropathy (HCC)   . Benign essential HTN   . Stage 2 chronic kidney disease   . Dysphagia   . Debility   . Encephalopathy   . Pressure injury of skin 08/04/2017  . Pneumonia of left lung due to infectious organism   . Diffuse pulmonary alveolar hemorrhage   . SOB (shortness of breath) 10/11/2016  . Hyperparathyroidism, secondary renal ([place] 05/02/2016  . Gout 04/11/2015  . Diabetic neuropathy associated with type 2 diabetes mellitus ([place] 01/06/2015  . SBO (small bowel obstruction) ([place] 09/15/2013  . Breast cancer of lower-inner quadrant of right female breast ([place] 03/18/2013  . Chronic venous insufficiency 01/06/2013  . Health care maintenance 05/08/2011  . Diabetes mellitus type 2 in obese ([place] 05/03/2010  . Seasonal allergies 05/03/2010  . Labile hypertension   . CAD (coronary artery disease)   . Abdominal discomfort   . Anemia in chronic kidney disease   . History of stroke without residual deficits   . Hyperlipidemia associated with type 2 diabetes mellitus ([place]   . Chronic pain   . Chronic kidney disease, stage III (moderate) (HCC)   . Adnexal mass     DCarney LivingPT DPT  12/03/2017, 8:43 AM  [phone]  [place], [place], [address] Phone: [phone]   Fax:  [phone]  Name: [name] MRN: [id number] Date of Birth: [birth year]

## 2017-12-04 ENCOUNTER — Ambulatory Visit: Payer: Medicare Other | Admitting: Physical Therapy

## 2017-12-04 ENCOUNTER — Encounter: Payer: Self-pay | Admitting: Physical Therapy

## 2017-12-04 DIAGNOSIS — G8929 Other chronic pain: Secondary | ICD-10-CM | POA: Diagnosis not present

## 2017-12-04 DIAGNOSIS — R262 Difficulty in walking, not elsewhere classified: Secondary | ICD-10-CM

## 2017-12-04 DIAGNOSIS — M5441 Lumbago with sciatica, right side: Principal | ICD-10-CM

## 2017-12-04 DIAGNOSIS — R29898 Other symptoms and signs involving the musculoskeletal system: Secondary | ICD-10-CM | POA: Diagnosis not present

## 2017-12-05 ENCOUNTER — Encounter: Payer: Self-pay | Admitting: Physical Therapy

## 2017-12-05 NOTE — Therapy (Signed)
[place], [place], [address] Phone: [phone]   Fax:  [phone]  Physical Therapy Treatment  Patient Details  Name: [name] MRN: [id number] Date of Birth: 1945-05-08 Referring Provider: Daleen Squibb MD   Encounter Date: 12/04/2017  PT End of Session - 12/05/17 1113    Visit Number  3    Number of Visits  10    Date for PT Re-Evaluation  12/24/17    Authorization Type  UHC MCR and secondary is champ [facility], no authorization required or visit limit    PT Start Time  1631    PT Stop Time  1727    PT Time Calculation (min)  56 min    Activity Tolerance  Patient tolerated treatment well    Behavior During Therapy  [facility] for tasks assessed/performed       Past Medical History:  Diagnosis Date  . Abdominal discomfort    Chronic N/V/D. Presumptive dx Crohn's dx per elevated p ANCA. Failed Entocort and Pentasa. Sep 2003 - ileocolectomy c anastomosis per Dr Deon Pilling 2/2 adhesions - path was hegative for Crohns. EGD, Sm bowel follow through (11/03), and an eteroclysis (10/03) were unrevealing. Cuases hypomag and hypocalcemia.  . Adnexal mass 8/03   s/p lap BSO (R ovarian fibroma) & lysis of adhesions  . Allergy    Seasonal  . Anemia    Multifactorial. Baseline HgB 10-11 ish. B12 def - 150 in 3/10. Fe Def - ferritin 35 3/10. Both are being repleted.  . Breast cancer ([place]) 03/16/13   right, 5 o'clock  . CAD (coronary artery disease) 1996   1996 - PTCA and angioplasty diagonal branch. 2000 - Rotoblator & angiopllasty of diagonal. 2006 - subendocardial AMI, DES to proximal LAD.Marland Kitchen Also had 90% stenosis in distal apical LAD. EF 55 with apical hypokinesis. Indefinite ASA and Plavix.  . CHF (congestive heart failure) ([place])   . Chronic kidney disease    Chronic renal insuff baseline Cr 1.2 - 1.4 ish.  . Chronic pain    CT 10/10 = Spinal stenosis L2 - S1.  . Diabetes mellitus    Insulin dependent  . Hx of radiation  therapy 06/02/13- 07/16/13   right rbeast 4500 cGy 25 sessions, right breast boost 1600 cGy in 8 sessions  . Hyperlipidemia    Managed with both a statin and Welchol. Welchol stopped 2014 2/2 cost and started on fenofibrate   . Hypertension    2006 B renal arteries patent. 2003 MRA - no RAS. 2003 pheo W/U Dr Hassell Done reportedly negative.  . Hypoxia 07/23/2017  . Lupus ([place])   . Personal history of radiation therapy   . RBBB   . SBO (small bowel obstruction) ([place]) 09/17/2017  . Stroke [facility])    Incidental finding MRI 2002 L lacunar infarct  . Wears dentures    top    Past Surgical History:  Procedure Laterality Date  . ABDOMINAL HYSTERECTOMY    . BILATERAL SALPINGOOPHORECTOMY  8/03   Lap BSO (R ovarian fibroma) and adhesion lysis  . BOWEL RESECTION  2003   ileocolectomy with anastomosis 2/2 adhesions  . BREAST BIOPSY    . BREAST LUMPECTOMY Right   . BREAST LUMPECTOMY WITH NEEDLE LOCALIZATION AND AXILLARY SENTINEL LYMPH NODE BX Right 04/22/2013   Procedure: BREAST LUMPECTOMY WITH NEEDLE LOCALIZATION AND AXILLARY SENTINEL LYMPH NODE BX;  Surgeon: Stark Klein, MD;  Location: [facility];  Service: General;  Laterality: Right;  . CARDIAC CATHETERIZATION  2 stents  . CHOLECYSTECTOMY    . HEMICOLECTOMY     R sided hemicolectomy  . HERNIA REPAIR     Ventral hernia repair  . PTCA  4/06    There were no vitals filed for this visit.  Subjective Assessment - 12/04/17 1635    Subjective  Patient reports her pain has been worse over the past few days. She had to take a muscle releasxer last night.     Pertinent History  PMH: Anemia, Breast cancer, CAD,CHF,MI, Chronic kidney disease, Chronic pain, DM, radiation therapy (06/02/13- 07/16/13), Hyperlipidemia, HTN, Hypoxia. SOB    Limitations  Sitting;Lifting;Standing;Walking;House hold activities    How long can you sit comfortably?  10-15 min    How long can you stand comfortably?  15 min    How long can you walk comfortably?  10  min    Diagnostic tests  x-rays reveal degenerative disc disease at L2-L3 L3-L4 and L4-L5.  -L4-L5appear to be most affected.    Patient Stated Goals  get rid of pain and stiffness    Pain Score  8                        [facility] Adult PT Treatment/Exercise - 12/05/17 0001      Lumbar Exercises: Stretches   Active Hamstring Stretch Limitations  seated 2x20 sec hold      Lumbar Exercises: Standing   Other Standing Lumbar Exercises  standing shoulder extnesion x10 yellow wqith cuing for breathing. Scpa retraction Patient leans back with exercises.       Lumbar Exercises: Seated   Other Seated Lumbar Exercises  seated clam shell 2x10;setaed ball roll for stretch needed to move to a cane because the patient reported shoulder pain; Seated hip abduction;       Moist Heat Therapy   Number Minutes Moist Heat  10 Minutes    Moist Heat Location  Lumbar Spine      Electrical Stimulation   Electrical Stimulation Location  lumbar    Electrical Stimulation Action  IFC     Electrical Stimulation Parameters  within tolerance     Electrical Stimulation Goals  Tone;Pain      Manual Therapy   Manual Therapy  Soft tissue mobilization    Soft tissue mobilization  trigger point release and soft tissue mobilization to lumbar spine and glutels              PT Education - 12/05/17 1113    Education Details  technique with ther-ex     Person(s) Educated  Patient    Methods  Explanation;Demonstration;Tactile cues;Verbal cues    Comprehension  Verbal cues required;Tactile cues required;Need further instruction          PT Long Term Goals - 12/05/17 1118      PT LONG TERM GOAL #1   Title  Pt will be I and compliant with HEP. 5 weeks 12/24/17    Time  4    Period  Weeks    Status  On-going      PT LONG TERM GOAL #2   Title  Pt will improve FOTO to less than 55% limited. 5 weeks 12/24/17    Time  4    Period  Weeks    Status  On-going      PT LONG TERM GOAL #3   Title  Pt  will improve lumbar ROM to [facility]. 5 weeks 12/24/17    Time  4  Period  Weeks    Status  On-going      PT LONG TERM GOAL #4   Title  Pt will improve Rt LE strength to overall 4+/5 to 5-/5 grossly. 5 weeks 12/24/17    Time  4    Period  Weeks    Status  On-going      PT LONG TERM GOAL #5   Title  Pt will decrease overall pain to less than 3/10 with usual activity. 5 weeks 12/24/17    Time  4    Period  Weeks    Status  On-going            Plan - 12/05/17 1113    Clinical Impression Statement  Patient required significant cuing with exercises. With all exercises she tends to lean back while doing the movement that is asked of her. She reported improved pain with the e-stim. Therapy advised her to continue with her HEP at this time until she improves her technique with exercises.     Clinical Presentation  Evolving    Clinical Decision Making  Moderate    Rehab Potential  Good    Clinical Impairments Affecting Rehab Potential  multiple co morbidiites and deconditioning, pnemonia    PT Frequency  2x / week    PT Duration  4 weeks    PT Treatment/Interventions  Cryotherapy;Electrical Stimulation;Iontophoresis 36m/ml Dexamethasone;Moist Heat;Traction;Ultrasound;Therapeutic activities;Therapeutic exercise;Balance training;Neuromuscular re-education;Manual techniques;Passive range of motion;Dry needling;Vasopneumatic Device;Taping    PT Next Visit Plan  DGI or FGA to assess balance, LE strength, lumbar stretching    Consulted and Agree with Plan of Care  Patient       Patient will benefit from skilled therapeutic intervention in order to improve the following deficits and impairments:  Abnormal gait, Decreased activity tolerance, Decreased balance, Decreased endurance, Decreased range of motion, Difficulty walking, Decreased strength, Hypomobility, Increased fascial restricitons, Increased muscle spasms, Impaired flexibility, Pain, Postural dysfunction  Visit Diagnosis: Chronic  right-sided low back pain with right-sided sciatica  Difficulty in walking, not elsewhere classified     Problem List Patient Active Problem List   Diagnosis Date Noted  . [place]al vein thrombosis ([place] 09/17/2017  . Hoarse voice quality 09/09/2017  . Low back pain without sciatica 09/09/2017  . Labile blood glucose   . Sleep disturbance   . Leukocytosis   . Essential hypertension   . Urinary frequency   . Type 2 diabetes mellitus with peripheral neuropathy (HCC)   . Benign essential HTN   . Stage 2 chronic kidney disease   . Dysphagia   . Debility   . Encephalopathy   . Pressure injury of skin 08/04/2017  . Pneumonia of left lung due to infectious organism   . Diffuse pulmonary alveolar hemorrhage   . SOB (shortness of breath) 10/11/2016  . Hyperparathyroidism, secondary renal ([place] 05/02/2016  . Gout 04/11/2015  . Diabetic neuropathy associated with type 2 diabetes mellitus ([place] 01/06/2015  . SBO (small bowel obstruction) ([place] 09/15/2013  . Breast cancer of lower-inner quadrant of right female breast ([place] 03/18/2013  . Chronic venous insufficiency 01/06/2013  . Health care maintenance 05/08/2011  . Diabetes mellitus type 2 in obese ([place] 05/03/2010  . Seasonal allergies 05/03/2010  . Labile hypertension   . CAD (coronary artery disease)   . Abdominal discomfort   . Anemia in chronic kidney disease   . History of stroke without residual deficits   . Hyperlipidemia associated with type 2 diabetes mellitus ([place]   . Chronic pain   .  Chronic kidney disease, stage III (moderate) (HCC)   . Adnexal mass     Carney Living PT DPT  12/05/2017, 11:20 AM  [facility] [address] [place], [place], [address] Phone: [phone]   Fax:  [phone]  Name: [name] MRN: [id number] Date of Birth: 01-31-1946

## 2017-12-06 DIAGNOSIS — I5022 Chronic systolic (congestive) heart failure: Secondary | ICD-10-CM | POA: Diagnosis not present

## 2017-12-06 DIAGNOSIS — R1310 Dysphagia, unspecified: Secondary | ICD-10-CM | POA: Diagnosis not present

## 2017-12-06 DIAGNOSIS — N183 Chronic kidney disease, stage 3 (moderate): Secondary | ICD-10-CM | POA: Diagnosis not present

## 2017-12-06 DIAGNOSIS — I2510 Atherosclerotic heart disease of native coronary artery without angina pectoris: Secondary | ICD-10-CM | POA: Diagnosis not present

## 2017-12-06 DIAGNOSIS — I823 Embolism and thrombosis of renal vein: Secondary | ICD-10-CM | POA: Diagnosis not present

## 2017-12-06 DIAGNOSIS — E1142 Type 2 diabetes mellitus with diabetic polyneuropathy: Secondary | ICD-10-CM | POA: Diagnosis not present

## 2017-12-06 DIAGNOSIS — K862 Cyst of pancreas: Secondary | ICD-10-CM | POA: Diagnosis not present

## 2017-12-06 DIAGNOSIS — M109 Gout, unspecified: Secondary | ICD-10-CM | POA: Diagnosis not present

## 2017-12-06 DIAGNOSIS — I130 Hypertensive heart and chronic kidney disease with heart failure and stage 1 through stage 4 chronic kidney disease, or unspecified chronic kidney disease: Secondary | ICD-10-CM | POA: Diagnosis not present

## 2017-12-06 DIAGNOSIS — G8929 Other chronic pain: Secondary | ICD-10-CM | POA: Diagnosis not present

## 2017-12-06 DIAGNOSIS — I4510 Unspecified right bundle-branch block: Secondary | ICD-10-CM | POA: Diagnosis not present

## 2017-12-06 DIAGNOSIS — I872 Venous insufficiency (chronic) (peripheral): Secondary | ICD-10-CM | POA: Diagnosis not present

## 2017-12-06 DIAGNOSIS — E1122 Type 2 diabetes mellitus with diabetic chronic kidney disease: Secondary | ICD-10-CM | POA: Diagnosis not present

## 2017-12-09 ENCOUNTER — Ambulatory Visit: Payer: Medicare Other | Admitting: Physical Therapy

## 2017-12-09 DIAGNOSIS — R262 Difficulty in walking, not elsewhere classified: Secondary | ICD-10-CM

## 2017-12-09 DIAGNOSIS — R29898 Other symptoms and signs involving the musculoskeletal system: Secondary | ICD-10-CM

## 2017-12-09 DIAGNOSIS — G8929 Other chronic pain: Secondary | ICD-10-CM

## 2017-12-09 DIAGNOSIS — M5441 Lumbago with sciatica, right side: Principal | ICD-10-CM

## 2017-12-09 NOTE — Therapy (Signed)
[place], [place], [address] Phone: [phone]   Fax:  [phone]  Physical Therapy Treatment  Patient Details  Name: [name] MRN: [id number] Date of Birth: September 23, 1945 Referring Provider: Daleen Squibb MD   Encounter Date: 12/09/2017  PT End of Session - 12/09/17 1648    Visit Number  4    Number of Visits  10    Date for PT Re-Evaluation  12/24/17    Authorization Type  UHC MCR and secondary is champ [facility], no authorization required or visit limit    PT Start Time  1630    PT Stop Time  1715    PT Time Calculation (min)  45 min    Activity Tolerance  Patient tolerated treatment well    Behavior During Therapy  [facility] for tasks assessed/performed       Past Medical History:  Diagnosis Date  . Abdominal discomfort    Chronic N/V/D. Presumptive dx Crohn's dx per elevated p ANCA. Failed Entocort and Pentasa. Sep 2003 - ileocolectomy c anastomosis per Dr Deon Pilling 2/2 adhesions - path was hegative for Crohns. EGD, Sm bowel follow through (11/03), and an eteroclysis (10/03) were unrevealing. Cuases hypomag and hypocalcemia.  . Adnexal mass 8/03   s/p lap BSO (R ovarian fibroma) & lysis of adhesions  . Allergy    Seasonal  . Anemia    Multifactorial. Baseline HgB 10-11 ish. B12 def - 150 in 3/10. Fe Def - ferritin 35 3/10. Both are being repleted.  . Breast cancer ([place]) 03/16/13   right, 5 o'clock  . CAD (coronary artery disease) 1996   1996 - PTCA and angioplasty diagonal branch. 2000 - Rotoblator & angiopllasty of diagonal. 2006 - subendocardial AMI, DES to proximal LAD.Marland Kitchen Also had 90% stenosis in distal apical LAD. EF 55 with apical hypokinesis. Indefinite ASA and Plavix.  . CHF (congestive heart failure) ([place])   . Chronic kidney disease    Chronic renal insuff baseline Cr 1.2 - 1.4 ish.  . Chronic pain    CT 10/10 = Spinal stenosis L2 - S1.  . Diabetes mellitus    Insulin dependent  . Hx of radiation  therapy 06/02/13- 07/16/13   right rbeast 4500 cGy 25 sessions, right breast boost 1600 cGy in 8 sessions  . Hyperlipidemia    Managed with both a statin and Welchol. Welchol stopped 2014 2/2 cost and started on fenofibrate   . Hypertension    2006 B renal arteries patent. 2003 MRA - no RAS. 2003 pheo W/U Dr Hassell Done reportedly negative.  . Hypoxia 07/23/2017  . Lupus ([place])   . Personal history of radiation therapy   . RBBB   . SBO (small bowel obstruction) ([place]) 09/17/2017  . Stroke [facility])    Incidental finding MRI 2002 L lacunar infarct  . Wears dentures    top    Past Surgical History:  Procedure Laterality Date  . ABDOMINAL HYSTERECTOMY    . BILATERAL SALPINGOOPHORECTOMY  8/03   Lap BSO (R ovarian fibroma) and adhesion lysis  . BOWEL RESECTION  2003   ileocolectomy with anastomosis 2/2 adhesions  . BREAST BIOPSY    . BREAST LUMPECTOMY Right   . BREAST LUMPECTOMY WITH NEEDLE LOCALIZATION AND AXILLARY SENTINEL LYMPH NODE BX Right 04/22/2013   Procedure: BREAST LUMPECTOMY WITH NEEDLE LOCALIZATION AND AXILLARY SENTINEL LYMPH NODE BX;  Surgeon: Stark Klein, MD;  Location: [place];  Service: General;  Laterality: Right;  . CARDIAC CATHETERIZATION  2 stents  . CHOLECYSTECTOMY    . HEMICOLECTOMY     R sided hemicolectomy  . HERNIA REPAIR     Ventral hernia repair  . PTCA  4/06    There were no vitals filed for this visit.  Subjective Assessment - 12/09/17 1647    Subjective  Pt relays pain is some better.    Currently in Pain?  Yes    Pain Score  4     Pain Location  Back    Pain Orientation  Right    Pain Descriptors / Indicators  Aching    Pain Type  Chronic pain         [facility] PT Assessment - 12/09/17 0001      Strength   Overall Strength Comments  4+/5 MMT grossly LE strength bilat                   [facility] Adult PT Treatment/Exercise - 12/09/17 0001      Exercises   Exercises  Lumbar      Lumbar Exercises: Stretches   Single Knee  to Chest Stretch  2 reps;30 seconds;Right;Left    Lower Trunk Rotation  3 reps;10 seconds    Piriformis Stretch  Right;Left;2 reps;30 seconds      Lumbar Exercises: Standing   Row  15 reps    Theraband Level (Row)  Level 1 (Yellow)    Shoulder Extension  15 reps    Theraband Level (Shoulder Extension)  Level 1 (Yellow)    Other Standing Lumbar Exercises  standing hip abd and ext X 10 bilat      Lumbar Exercises: Supine   Bridge  10 reps      Moist Heat Therapy   Number Minutes Moist Heat  10 Minutes    Moist Heat Location  Lumbar Spine      Electrical Stimulation   Electrical Stimulation Location  lumbar    Electrical Stimulation Action  IFC    Electrical Stimulation Parameters  tolerance    Electrical Stimulation Goals  Tone;Pain                  PT Long Term Goals - 12/09/17 1709      PT LONG TERM GOAL #1   Title  Pt will be I and compliant with HEP. 5 weeks 12/24/17    Time  4    Period  Weeks    Status  On-going      PT LONG TERM GOAL #2   Title  Pt will improve FOTO to less than 55% limited. 5 weeks 12/24/17    Period  Weeks    Status  On-going      PT LONG TERM GOAL #3   Title  Pt will improve lumbar ROM to [facility]. 5 weeks 12/24/17    Period  Weeks    Status  On-going      PT LONG TERM GOAL #4   Title  Pt will improve Rt LE strength to overall 4+/5 to 5-/5 grossly. 5 weeks 12/24/17    Period  Weeks    Status  On-going              Patient will benefit from skilled therapeutic intervention in order to improve the following deficits and impairments:     Visit Diagnosis: Chronic right-sided low back pain with right-sided sciatica  Difficulty in walking, not elsewhere classified  Muscular deconditioning     Problem List Patient Active Problem List   Diagnosis  Date Noted  . Renal vein thrombosis ([place]) 09/17/2017  . Hoarse voice quality 09/09/2017  . Low back pain without sciatica 09/09/2017  . Labile blood glucose   . Sleep disturbance    . Leukocytosis   . Essential hypertension   . Urinary frequency   . Type 2 diabetes mellitus with peripheral neuropathy (HCC)   . Benign essential HTN   . Stage 2 chronic kidney disease   . Dysphagia   . Debility   . Encephalopathy   . Pressure injury of skin 08/04/2017  . Pneumonia of left lung due to infectious organism   . Diffuse pulmonary alveolar hemorrhage   . SOB (shortness of breath) 10/11/2016  . Hyperparathyroidism, secondary renal ([place]) 05/02/2016  . Gout 04/11/2015  . Diabetic neuropathy associated with type 2 diabetes mellitus ([place]) 01/06/2015  . SBO (small bowel obstruction) ([place]) 09/15/2013  . Breast cancer of lower-inner quadrant of right female breast ([place]) 03/18/2013  . Chronic venous insufficiency 01/06/2013  . Health care maintenance 05/08/2011  . Diabetes mellitus type 2 in obese ([place]) 05/03/2010  . Seasonal allergies 05/03/2010  . Labile hypertension   . CAD (coronary artery disease)   . Abdominal discomfort   . Anemia in chronic kidney disease   . History of stroke without residual deficits   . Hyperlipidemia associated with type 2 diabetes mellitus ([place])   . Chronic pain   . Chronic kidney disease, stage III (moderate) (HCC)   . Adnexal mass     Debbe Odea, PT, DPT 12/09/2017, 5:12 PM  [facility] [address] [place], [place], [address] Phone: [phone]   Fax:  [phone]  Name: [name] MRN: [id number] Date of Birth: Dec 18, 1945

## 2017-12-11 ENCOUNTER — Ambulatory Visit: Payer: Medicare Other | Admitting: Physical Therapy

## 2017-12-13 ENCOUNTER — Other Ambulatory Visit: Payer: Self-pay

## 2017-12-13 DIAGNOSIS — H25811 Combined forms of age-related cataract, right eye: Secondary | ICD-10-CM | POA: Diagnosis not present

## 2017-12-13 DIAGNOSIS — H25812 Combined forms of age-related cataract, left eye: Secondary | ICD-10-CM | POA: Diagnosis not present

## 2017-12-13 NOTE — Patient Outreach (Signed)
[facility]) Care Management  12/13/2017  VENBA ZENNER 12-Mar-1946 [id number]   Medication Adherence call to Mrs. Leyanna Dastrup left a message for patient to call back patient is due on Atorvastatin 40 mg. Mrs.Srarkes is showing past due under [place].   [place] Management Direct Dial [phone]  Fax [phone] [email or web address]

## 2017-12-16 ENCOUNTER — Ambulatory Visit: Payer: Medicare Other | Admitting: Physical Therapy

## 2017-12-16 DIAGNOSIS — M5441 Lumbago with sciatica, right side: Secondary | ICD-10-CM | POA: Diagnosis not present

## 2017-12-16 DIAGNOSIS — G8929 Other chronic pain: Secondary | ICD-10-CM

## 2017-12-16 DIAGNOSIS — R29898 Other symptoms and signs involving the musculoskeletal system: Secondary | ICD-10-CM

## 2017-12-16 DIAGNOSIS — R262 Difficulty in walking, not elsewhere classified: Secondary | ICD-10-CM

## 2017-12-16 NOTE — Therapy (Signed)
[place], [place], [address] Phone: [phone]   Fax:  [phone]  Physical Therapy Treatment  Patient Details  Name: [name] MRN: [id number] Date of Birth: 06-23-1945 Referring Provider (PT): Daleen Squibb MD   Encounter Date: 12/16/2017  PT End of Session - 12/16/17 1718    Visit Number  5    Number of Visits  10    Date for PT Re-Evaluation  12/24/17    Authorization Type  UHC MCR and secondary is champ [facility], no authorization required or visit limit    PT Start Time  1628    PT Stop Time  1723    PT Time Calculation (min)  55 min    Activity Tolerance  Other (comment)   Pt. arrived with high pain level today, some difficulty assessing centralization versus peripheralization due to general symptoms throughout session, fair treatment tolerance due to high pain level   Behavior During Therapy  [facility] for tasks assessed/performed       Past Medical History:  Diagnosis Date  . Abdominal discomfort    Chronic N/V/D. Presumptive dx Crohn's dx per elevated p ANCA. Failed Entocort and Pentasa. Sep 2003 - ileocolectomy c anastomosis per Dr Deon Pilling 2/2 adhesions - path was hegative for Crohns. EGD, Sm bowel follow through (11/03), and an eteroclysis (10/03) were unrevealing. Cuases hypomag and hypocalcemia.  . Adnexal mass 8/03   s/p lap BSO (R ovarian fibroma) & lysis of adhesions  . Allergy    Seasonal  . Anemia    Multifactorial. Baseline HgB 10-11 ish. B12 def - 150 in 3/10. Fe Def - ferritin 35 3/10. Both are being repleted.  . Breast cancer ([place]) 03/16/13   right, 5 o'clock  . CAD (coronary artery disease) 1996   1996 - PTCA and angioplasty diagonal branch. 2000 - Rotoblator & angiopllasty of diagonal. 2006 - subendocardial AMI, DES to proximal LAD.Marland Kitchen Also had 90% stenosis in distal apical LAD. EF 55 with apical hypokinesis. Indefinite ASA and Plavix.  . CHF (congestive heart failure) ([place])   . Chronic  kidney disease    Chronic renal insuff baseline Cr 1.2 - 1.4 ish.  . Chronic pain    CT 10/10 = Spinal stenosis L2 - S1.  . Diabetes mellitus    Insulin dependent  . Hx of radiation therapy 06/02/13- 07/16/13   right rbeast 4500 cGy 25 sessions, right breast boost 1600 cGy in 8 sessions  . Hyperlipidemia    Managed with both a statin and Welchol. Welchol stopped 2014 2/2 cost and started on fenofibrate   . Hypertension    2006 B renal arteries patent. 2003 MRA - no RAS. 2003 pheo W/U Dr Hassell Done reportedly negative.  . Hypoxia 07/23/2017  . Lupus ([place])   . Personal history of radiation therapy   . RBBB   . SBO (small bowel obstruction) ([place]) 09/17/2017  . Stroke [facility])    Incidental finding MRI 2002 L lacunar infarct  . Wears dentures    top    Past Surgical History:  Procedure Laterality Date  . ABDOMINAL HYSTERECTOMY    . BILATERAL SALPINGOOPHORECTOMY  8/03   Lap BSO (R ovarian fibroma) and adhesion lysis  . BOWEL RESECTION  2003   ileocolectomy with anastomosis 2/2 adhesions  . BREAST BIOPSY    . BREAST LUMPECTOMY Right   . BREAST LUMPECTOMY WITH NEEDLE LOCALIZATION AND AXILLARY SENTINEL LYMPH NODE BX Right 04/22/2013   Procedure: BREAST LUMPECTOMY WITH NEEDLE LOCALIZATION  AND AXILLARY SENTINEL LYMPH NODE BX;  Surgeon: Stark Klein, MD;  Location: [facility];  Service: General;  Laterality: Right;  . CARDIAC CATHETERIZATION     2 stents  . CHOLECYSTECTOMY    . HEMICOLECTOMY     R sided hemicolectomy  . HERNIA REPAIR     Ventral hernia repair  . PTCA  4/06    There were no vitals filed for this visit.  Subjective Assessment - 12/16/17 1639    Subjective  Pt. reports continued RLE radiating pain worse with standing and walking. Pain 7/10 this PM. Per chart pt. has MRI ordered but has not completed as of treatment today/has not been scheduled for this.    Currently in Pain?  Yes    Pain Score  7     Pain Location  Leg   Lumbar pain with radiating RLE pain  distally to foot   Pain Orientation  Right    Pain Descriptors / Indicators  Aching    Pain Type  Chronic pain    Pain Radiating Towards  RLE distally to foot    Aggravating Factors   standing, walking    Pain Relieving Factors  rest                       [facility] Adult PT Treatment/Exercise - 12/16/17 0001      Exercises   Exercises  Lumbar      Lumbar Exercises: Stretches   Single Knee to Chest Stretch  Right;Left   15 reps x 5-10 sec holds each   Double Knee to Chest Stretch  --   x15 with manual assistance and 2x10 with 65 cm ball   Lower Trunk Rotation  3 reps    Piriformis Stretch  3 reps;30 seconds   bilateral     Lumbar Exercises: Seated   Other Seated Lumbar Exercises  seated flexion x 14 reps-stopped due to increased leg pain      Lumbar Exercises: Supine   Pelvic Tilt  20 reps    Pelvic Tilt Limitations   verbal and tactile cues for form    Bent Knee Raise  20 reps    Bridge  --   attempted but held after 4 reps due to increased right leg p     Moist Heat Therapy   Number Minutes Moist Heat  10 Minutes    Moist Heat Location  Lumbar Spine      Electrical Stimulation   Electrical Stimulation Location  lumbar    Electrical Stimulation Action  IFC    Electrical Stimulation Parameters  to tolerance    Electrical Stimulation Goals  Pain;Tone      Manual Therapy   Manual therapy comments  STM to lumbar paraspinals in left sidelying             PT Education - 12/16/17 1717    Education Details  HEP SKTC to try and centralize RLE pain     Person(s) Educated  Patient    Methods  Explanation;Demonstration;Verbal cues    Comprehension  Returned demonstration;Verbalized understanding          PT Long Term Goals - 12/09/17 1709      PT LONG TERM GOAL #1   Title  Pt will be I and compliant with HEP. 5 weeks 12/24/17    Time  4    Period  Weeks    Status  On-going      PT LONG TERM  GOAL #2   Title  Pt will improve FOTO to less than 55%  limited. 5 weeks 12/24/17    Period  Weeks    Status  On-going      PT LONG TERM GOAL #3   Title  Pt will improve lumbar ROM to [facility]. 5 weeks 12/24/17    Period  Weeks    Status  On-going      PT LONG TERM GOAL #4   Title  Pt will improve Rt LE strength to overall 4+/5 to 5-/5 grossly. 5 weeks 12/24/17    Period  Weeks    Status  On-going            Plan - 12/16/17 1720    Clinical Impression Statement  Symptoms of RLE radiating pain worse with standing/walking consistent with radiculopathy, would suspect potential underlying stenosis but pt. still does not have MRI to correlate.     Clinical Presentation  Evolving    Rehab Potential  Fair    Clinical Impairments Affecting Rehab Potential  multiple co morbidiites and deconditioning, pnemonia    PT Frequency  2x / week    PT Duration  4 weeks    PT Treatment/Interventions  Cryotherapy;Electrical Stimulation;Iontophoresis 1m/ml Dexamethasone;Moist Heat;Traction;Ultrasound;Therapeutic activities;Therapeutic exercise;Balance training;Neuromuscular re-education;Manual techniques;Passive range of motion;Dry needling;Vasopneumatic Device;Taping    PT Next Visit Plan  Check resonse to flexion bias exercises/SKTC stretch on radiating symptoms    Recommended Other Services  Pt. is recommended to check back with MD re: MRI, per chart this has been ordered but still not scheduled as of treatment today.    Consulted and Agree with Plan of Care  Patient       Patient will benefit from skilled therapeutic intervention in order to improve the following deficits and impairments:  Abnormal gait, Decreased activity tolerance, Decreased balance, Decreased endurance, Decreased range of motion, Difficulty walking, Decreased strength, Hypomobility, Increased fascial restricitons, Increased muscle spasms, Impaired flexibility, Pain, Postural dysfunction  Visit Diagnosis: Chronic right-sided low back pain with right-sided sciatica  Difficulty in walking,  not elsewhere classified  Muscular deconditioning     Problem List Patient Active Problem List   Diagnosis Date Noted  . Renal vein thrombosis ([place] 09/17/2017  . Hoarse voice quality 09/09/2017  . Low back pain without sciatica 09/09/2017  . Labile blood glucose   . Sleep disturbance   . Leukocytosis   . Essential hypertension   . Urinary frequency   . Type 2 diabetes mellitus with peripheral neuropathy (HCC)   . Benign essential HTN   . Stage 2 chronic kidney disease   . Dysphagia   . Debility   . Encephalopathy   . Pressure injury of skin 08/04/2017  . Pneumonia of left lung due to infectious organism   . Diffuse pulmonary alveolar hemorrhage   . SOB (shortness of breath) 10/11/2016  . Hyperparathyroidism, secondary renal ([place] 05/02/2016  . Gout 04/11/2015  . Diabetic neuropathy associated with type 2 diabetes mellitus ([place] 01/06/2015  . SBO (small bowel obstruction) ([place] 09/15/2013  . Breast cancer of lower-inner quadrant of right female breast ([place] 03/18/2013  . Chronic venous insufficiency 01/06/2013  . Health care maintenance 05/08/2011  . Diabetes mellitus type 2 in obese ([place] 05/03/2010  . Seasonal allergies 05/03/2010  . Labile hypertension   . CAD (coronary artery disease)   . Abdominal discomfort   . Anemia in chronic kidney disease   . History of stroke without residual deficits   . Hyperlipidemia associated with type 2  diabetes mellitus ([place])   . Chronic pain   . Chronic kidney disease, stage III (moderate) (HCC)   . Adnexal mass     Herma Carson, PT, DPT 12/16/2017, 5:27 PM  [facility] [address] [place], [place], [address] Phone: [phone]   Fax:  [phone]  Name: [name] MRN: [id number] Date of Birth: January 29, 1946

## 2017-12-17 ENCOUNTER — Telehealth: Payer: Self-pay

## 2017-12-17 NOTE — Telephone Encounter (Signed)
Pt called states that therapy needs a copy of her Lumbar MRI. According to records she never got the MRI done. Spoke to [employer] and she states she did send the order and pt can call [facility] to set an appointment up. I called pt back to notify her and to give her [place] phone number. MRI should have been done in July but pt never went.

## 2017-12-18 ENCOUNTER — Ambulatory Visit: Payer: Medicare Other | Attending: Physical Medicine & Rehabilitation | Admitting: Physical Therapy

## 2017-12-18 DIAGNOSIS — G8929 Other chronic pain: Secondary | ICD-10-CM | POA: Insufficient documentation

## 2017-12-18 DIAGNOSIS — R29898 Other symptoms and signs involving the musculoskeletal system: Secondary | ICD-10-CM | POA: Insufficient documentation

## 2017-12-18 DIAGNOSIS — R262 Difficulty in walking, not elsewhere classified: Secondary | ICD-10-CM | POA: Insufficient documentation

## 2017-12-18 DIAGNOSIS — M5441 Lumbago with sciatica, right side: Secondary | ICD-10-CM | POA: Insufficient documentation

## 2017-12-19 ENCOUNTER — Other Ambulatory Visit: Payer: Medicare Other

## 2017-12-19 ENCOUNTER — Ambulatory Visit: Payer: Medicare Other | Admitting: Hematology

## 2017-12-21 ENCOUNTER — Ambulatory Visit
Admission: RE | Admit: 2017-12-21 | Discharge: 2017-12-21 | Disposition: A | Discharge status: Home / Self Care | Payer: Medicare Other | Source: Ambulatory Visit | Attending: Physical Medicine & Rehabilitation | Admitting: Physical Medicine & Rehabilitation

## 2017-12-21 DIAGNOSIS — M48061 Spinal stenosis, lumbar region without neurogenic claudication: Secondary | ICD-10-CM | POA: Diagnosis not present

## 2017-12-21 DIAGNOSIS — M5136 Other intervertebral disc degeneration, lumbar region: Secondary | ICD-10-CM

## 2017-12-23 ENCOUNTER — Encounter: Payer: Self-pay | Admitting: Physical Therapy

## 2017-12-23 ENCOUNTER — Ambulatory Visit: Payer: Medicare Other | Admitting: Physical Therapy

## 2017-12-23 ENCOUNTER — Encounter: Payer: Medicare Other | Admitting: Physical Medicine & Rehabilitation

## 2017-12-23 ENCOUNTER — Other Ambulatory Visit: Payer: Self-pay

## 2017-12-23 DIAGNOSIS — G8929 Other chronic pain: Secondary | ICD-10-CM | POA: Diagnosis not present

## 2017-12-23 DIAGNOSIS — R262 Difficulty in walking, not elsewhere classified: Secondary | ICD-10-CM

## 2017-12-23 DIAGNOSIS — R29898 Other symptoms and signs involving the musculoskeletal system: Secondary | ICD-10-CM | POA: Diagnosis not present

## 2017-12-23 DIAGNOSIS — M5441 Lumbago with sciatica, right side: Principal | ICD-10-CM

## 2017-12-23 NOTE — Therapy (Signed)
[place], [place], [address] Phone: [phone]   Fax:  [phone]  Physical Therapy Treatment Progress Note Reporting Period 12/23/17 to 01/27/18  See note below for Objective Data and Assessment of Progress/Goals.        Patient Details  Name: [name] MRN: [id number] Date of Birth: 1945/07/21 Referring Provider (PT): Daleen Squibb MD   Encounter Date: 12/23/2017    Past Medical History:  Diagnosis Date  . Abdominal discomfort    Chronic N/V/D. Presumptive dx Crohn's dx per elevated p ANCA. Failed Entocort and Pentasa. Sep 2003 - ileocolectomy c anastomosis per Dr Deon Pilling 2/2 adhesions - path was hegative for Crohns. EGD, Sm bowel follow through (11/03), and an eteroclysis (10/03) were unrevealing. Cuases hypomag and hypocalcemia.  . Adnexal mass 8/03   s/p lap BSO (R ovarian fibroma) & lysis of adhesions  . Allergy    Seasonal  . Anemia    Multifactorial. Baseline HgB 10-11 ish. B12 def - 150 in 3/10. Fe Def - ferritin 35 3/10. Both are being repleted.  . Breast cancer ([place]) 03/16/13   right, 5 o'clock  . CAD (coronary artery disease) 1996   1996 - PTCA and angioplasty diagonal branch. 2000 - Rotoblator & angiopllasty of diagonal. 2006 - subendocardial AMI, DES to proximal LAD.Marland Kitchen Also had 90% stenosis in distal apical LAD. EF 55 with apical hypokinesis. Indefinite ASA and Plavix.  . CHF (congestive heart failure) ([place])   . Chronic kidney disease    Chronic renal insuff baseline Cr 1.2 - 1.4 ish.  . Chronic pain    CT 10/10 = Spinal stenosis L2 - S1.  . Diabetes mellitus    Insulin dependent  . Hx of radiation therapy 06/02/13- 07/16/13   right rbeast 4500 cGy 25 sessions, right breast boost 1600 cGy in 8 sessions  . Hyperlipidemia    Managed with both a statin and Welchol. Welchol stopped 2014 2/2 cost and started on fenofibrate   . Hypertension    2006 B renal arteries patent. 2003 MRA - no  RAS. 2003 pheo W/U Dr Hassell Done reportedly negative.  . Hypoxia 07/23/2017  . Lupus ([place])   . Personal history of radiation therapy   . RBBB   . SBO (small bowel obstruction) ([place]) 09/17/2017  . Stroke [facility])    Incidental finding MRI 2002 L lacunar infarct  . Wears dentures    top    Past Surgical History:  Procedure Laterality Date  . ABDOMINAL HYSTERECTOMY    . BILATERAL SALPINGOOPHORECTOMY  8/03   Lap BSO (R ovarian fibroma) and adhesion lysis  . BOWEL RESECTION  2003   ileocolectomy with anastomosis 2/2 adhesions  . BREAST BIOPSY    . BREAST LUMPECTOMY Right   . BREAST LUMPECTOMY WITH NEEDLE LOCALIZATION AND AXILLARY SENTINEL LYMPH NODE BX Right 04/22/2013   Procedure: BREAST LUMPECTOMY WITH NEEDLE LOCALIZATION AND AXILLARY SENTINEL LYMPH NODE BX;  Surgeon: Stark Klein, MD;  Location: [place];  Service: General;  Laterality: Right;  . CARDIAC CATHETERIZATION     2 stents  . CHOLECYSTECTOMY    . HEMICOLECTOMY     R sided hemicolectomy  . HERNIA REPAIR     Ventral hernia repair  . PTCA  4/06    There were no vitals filed for this visit.  Subjective Assessment - 12/23/17 2110    Subjective  Pt. had MRI Saturday 12/21/17 but has not yet seen MD to go over results (she has  follow up appointment tomorrow to go over). She continuues with RLE radiating pain distally to foot. Pt. reports mild gains with therapy for standing tolerance but continues with associated difficulty walking.    Pertinent History  PMH: Anemia, Breast cancer, CAD,CHF,MI, Chronic kidney disease, Chronic pain, DM, radiation therapy (06/02/13- 07/16/13), Hyperlipidemia, HTN, Hypoxia. SOB    Limitations  Sitting;Lifting;Standing;Walking;House hold activities   walking and standing are more limited than sitting   How long can you sit comfortably?  varies but at least 10-15 minutes    How long can you stand comfortably?  20 minutes    How long can you walk comfortably?  10 minutes    Diagnostic tests   MRI, X-rays    Patient Stated Goals  get rid of pain and stiffness    Currently in Pain?  Yes    Pain Score  3     Pain Location  Back    Pain Orientation  Right    Pain Descriptors / Indicators  Burning;Aching    Pain Type  Chronic pain    Pain Radiating Towards  RLE distally to foot    Pain Onset  More than a month ago    Pain Frequency  Intermittent    Aggravating Factors   standing and walking    Pain Relieving Factors  rest    Effect of Pain on Daily Activities  limited ability standing for chores, IADLs, difficulty walking for community mobility         [facility] PT Assessment - 12/23/17 0001      Observation/Other Assessments   Skin Integrity  Pt. has small circular wound 1cm in diameter right lumbar region/appears healing-pt. reports has been present for a week or two/does not recall cause-avoided direct placement electrode pads this region today    Focus on Therapeutic Outcomes (FOTO)   53      AROM   Lumbar Flexion  75%    Lumbar Extension  75%    Lumbar - Right Side Bend  75%    Lumbar - Left Side Bend  50%    Lumbar - Right Rotation  75%    Lumbar - Left Rotation  75%      Strength   Overall Strength Comments  Rt. LE grossly 4/5, Lt. LE grossly 4+/5                   [facility] Adult PT Treatment/Exercise - 12/23/17 0001      Exercises   Exercises  Lumbar      Lumbar Exercises: Stretches   Lower Trunk Rotation  2 reps;10 seconds   2x10 ea. indep. and assisted, 2x10 legs on 65 cm ball   Pelvic Tilt  20 reps    Piriformis Stretch  Right;3 reps;30 seconds      Lumbar Exercises: Supine   Other Supine Lumbar Exercises  hip add. isometric with ball x 20 reps    Other Supine Lumbar Exercises  supine clam red 2x10      Lumbar Exercises: Prone   Other Prone Lumbar Exercises  prone on elbows x 2 min      Moist Heat Therapy   Number Minutes Moist Heat  10 Minutes    Moist Heat Location  Lumbar Spine      Electrical Stimulation   Electrical Stimulation  Location  lumbar    Electrical Stimulation Action  IFC    Electrical Stimulation Parameters  to tolerance    Electrical Stimulation Goals  Pain  Manual Therapy   Manual therapy comments  STM to lumbar paraspinals in left sidelying             PT Education - 12/23/17 2118    Education Details  HEP updates-left LTR for right "opening" and trial POE to centralize RLE pain    Person(s) Educated  Patient    Methods  Explanation;Demonstration;Handout;Verbal cues    Comprehension  Verbalized understanding;Returned demonstration;Verbal cues required          PT Long Term Goals - 12/23/17 2119      PT LONG TERM GOAL #1   Title  Pt will be I and compliant with HEP. 5 weeks 01/27/18    Baseline  HEP updated 12-23-17    Time  5    Period  Weeks    Status  Revised      PT LONG TERM GOAL #2   Title  Pt will improve FOTO to less than 55% limited. 5 weeks 01/27/18    Time  5    Status  On-going      PT LONG TERM GOAL #3   Title  Pt will improve lumbar ROM to [facility]. 5 weeks 01/27/18    Time  5    Period  Weeks    Status  On-going      PT LONG TERM GOAL #4   Title  Pt will improve Rt LE strength to overall 4+/5 to 5-/5 grossly. 5 weeks 01/27/18    Time  5    Period  Weeks    Status  On-going      PT LONG TERM GOAL #5   Title  Pt will decrease overall pain to less than 3/10 with usual activity. 5 weeks 01/27/18    Time  5    Period  Weeks    Status  On-going      Additional Long Term Goals   Additional Long Term Goals  Yes              Patient will benefit from skilled therapeutic intervention in order to improve the following deficits and impairments:     Visit Diagnosis: Chronic right-sided low back pain with right-sided sciatica  Difficulty in walking, not elsewhere classified  Muscular deconditioning     Problem List Patient Active Problem List   Diagnosis Date Noted  . Renal vein thrombosis ([place]) 09/17/2017  . Hoarse voice quality 09/09/2017   . Low back pain without sciatica 09/09/2017  . Labile blood glucose   . Sleep disturbance   . Leukocytosis   . Essential hypertension   . Urinary frequency   . Type 2 diabetes mellitus with peripheral neuropathy (HCC)   . Benign essential HTN   . Stage 2 chronic kidney disease   . Dysphagia   . Debility   . Encephalopathy   . Pressure injury of skin 08/04/2017  . Pneumonia of left lung due to infectious organism   . Diffuse pulmonary alveolar hemorrhage   . SOB (shortness of breath) 10/11/2016  . Hyperparathyroidism, secondary renal ([place]) 05/02/2016  . Gout 04/11/2015  . Diabetic neuropathy associated with type 2 diabetes mellitus ([place]) 01/06/2015  . SBO (small bowel obstruction) ([place]) 09/15/2013  . Breast cancer of lower-inner quadrant of right female breast ([place]) 03/18/2013  . Chronic venous insufficiency 01/06/2013  . Health care maintenance 05/08/2011  . Diabetes mellitus type 2 in obese ([place]) 05/03/2010  . Seasonal allergies 05/03/2010  . Labile hypertension   . CAD (coronary artery disease)   .  Abdominal discomfort   . Anemia in chronic kidney disease   . History of stroke without residual deficits   . Hyperlipidemia associated with type 2 diabetes mellitus ([place])   . Chronic pain   . Chronic kidney disease, stage III (moderate) (HCC)   . Adnexal mass    Beaulah Dinning, PT, DPT 12/23/17 9:32 PM  [facility] [address] [place], [place], [address] Phone: [phone]   Fax:  [phone]  Name: [name] MRN: [id number] Date of Birth: 1945/08/23

## 2017-12-24 ENCOUNTER — Encounter: Payer: Self-pay | Admitting: Physical Medicine & Rehabilitation

## 2017-12-24 ENCOUNTER — Telehealth: Payer: Self-pay | Admitting: *Deleted

## 2017-12-24 ENCOUNTER — Encounter: Payer: Medicare Other | Attending: Physical Medicine & Rehabilitation | Admitting: Physical Medicine & Rehabilitation

## 2017-12-24 ENCOUNTER — Ambulatory Visit: Payer: Medicare Other | Admitting: Internal Medicine

## 2017-12-24 VITALS — BP 128/64 | HR 67 | Resp 14 | Ht 63.0 in | Wt 179.0 lb

## 2017-12-24 DIAGNOSIS — Z8042 Family history of malignant neoplasm of prostate: Secondary | ICD-10-CM | POA: Insufficient documentation

## 2017-12-24 DIAGNOSIS — R5381 Other malaise: Secondary | ICD-10-CM | POA: Diagnosis not present

## 2017-12-24 DIAGNOSIS — Z90710 Acquired absence of both cervix and uterus: Secondary | ICD-10-CM | POA: Diagnosis not present

## 2017-12-24 DIAGNOSIS — Z794 Long term (current) use of insulin: Secondary | ICD-10-CM | POA: Insufficient documentation

## 2017-12-24 DIAGNOSIS — I5022 Chronic systolic (congestive) heart failure: Secondary | ICD-10-CM | POA: Diagnosis not present

## 2017-12-24 DIAGNOSIS — I4510 Unspecified right bundle-branch block: Secondary | ICD-10-CM | POA: Insufficient documentation

## 2017-12-24 DIAGNOSIS — M47816 Spondylosis without myelopathy or radiculopathy, lumbar region: Secondary | ICD-10-CM | POA: Insufficient documentation

## 2017-12-24 DIAGNOSIS — Z803 Family history of malignant neoplasm of breast: Secondary | ICD-10-CM | POA: Insufficient documentation

## 2017-12-24 DIAGNOSIS — Z841 Family history of disorders of kidney and ureter: Secondary | ICD-10-CM | POA: Insufficient documentation

## 2017-12-24 DIAGNOSIS — E1122 Type 2 diabetes mellitus with diabetic chronic kidney disease: Secondary | ICD-10-CM | POA: Insufficient documentation

## 2017-12-24 DIAGNOSIS — G9340 Encephalopathy, unspecified: Secondary | ICD-10-CM | POA: Insufficient documentation

## 2017-12-24 DIAGNOSIS — M329 Systemic lupus erythematosus, unspecified: Secondary | ICD-10-CM | POA: Insufficient documentation

## 2017-12-24 DIAGNOSIS — Z8709 Personal history of other diseases of the respiratory system: Secondary | ICD-10-CM | POA: Insufficient documentation

## 2017-12-24 DIAGNOSIS — E11649 Type 2 diabetes mellitus with hypoglycemia without coma: Secondary | ICD-10-CM | POA: Diagnosis not present

## 2017-12-24 DIAGNOSIS — Z8674 Personal history of sudden cardiac arrest: Secondary | ICD-10-CM | POA: Diagnosis not present

## 2017-12-24 DIAGNOSIS — G8929 Other chronic pain: Secondary | ICD-10-CM | POA: Diagnosis not present

## 2017-12-24 DIAGNOSIS — Z801 Family history of malignant neoplasm of trachea, bronchus and lung: Secondary | ICD-10-CM | POA: Insufficient documentation

## 2017-12-24 DIAGNOSIS — M48061 Spinal stenosis, lumbar region without neurogenic claudication: Secondary | ICD-10-CM | POA: Diagnosis not present

## 2017-12-24 DIAGNOSIS — M48062 Spinal stenosis, lumbar region with neurogenic claudication: Secondary | ICD-10-CM

## 2017-12-24 DIAGNOSIS — R490 Dysphonia: Secondary | ICD-10-CM | POA: Insufficient documentation

## 2017-12-24 DIAGNOSIS — Z9049 Acquired absence of other specified parts of digestive tract: Secondary | ICD-10-CM | POA: Diagnosis not present

## 2017-12-24 DIAGNOSIS — Z955 Presence of coronary angioplasty implant and graft: Secondary | ICD-10-CM | POA: Diagnosis not present

## 2017-12-24 DIAGNOSIS — Z8249 Family history of ischemic heart disease and other diseases of the circulatory system: Secondary | ICD-10-CM | POA: Insufficient documentation

## 2017-12-24 DIAGNOSIS — Z9889 Other specified postprocedural states: Secondary | ICD-10-CM | POA: Diagnosis not present

## 2017-12-24 DIAGNOSIS — I252 Old myocardial infarction: Secondary | ICD-10-CM | POA: Insufficient documentation

## 2017-12-24 DIAGNOSIS — Z853 Personal history of malignant neoplasm of breast: Secondary | ICD-10-CM | POA: Diagnosis not present

## 2017-12-24 DIAGNOSIS — E785 Hyperlipidemia, unspecified: Secondary | ICD-10-CM | POA: Insufficient documentation

## 2017-12-24 DIAGNOSIS — I2510 Atherosclerotic heart disease of native coronary artery without angina pectoris: Secondary | ICD-10-CM | POA: Diagnosis not present

## 2017-12-24 DIAGNOSIS — K5090 Crohn's disease, unspecified, without complications: Secondary | ICD-10-CM | POA: Insufficient documentation

## 2017-12-24 DIAGNOSIS — I130 Hypertensive heart and chronic kidney disease with heart failure and stage 1 through stage 4 chronic kidney disease, or unspecified chronic kidney disease: Secondary | ICD-10-CM | POA: Insufficient documentation

## 2017-12-24 DIAGNOSIS — Z8673 Personal history of transient ischemic attack (TIA), and cerebral infarction without residual deficits: Secondary | ICD-10-CM | POA: Insufficient documentation

## 2017-12-24 DIAGNOSIS — Z800 Family history of malignant neoplasm of digestive organs: Secondary | ICD-10-CM | POA: Insufficient documentation

## 2017-12-24 MED ORDER — GABAPENTIN 100 MG PO CAPS: 100.0000 mg | ORAL_CAPSULE | Freq: Four times a day (QID) | ORAL | 3 refills | Status: DC

## 2017-12-24 NOTE — Patient Instructions (Signed)
PLEASE FEEL FREE TO CALL OUR OFFICE WITH ANY PROBLEMS OR QUESTIONS ([phone])      

## 2017-12-24 NOTE — Progress Notes (Signed)
Subjective:    Patient ID: [name], female    DOB: Feb 09, 1946, 72 y.o.   MRN: [id number]  HPI   [name] is here in follow up of her encephalopathy and low back pain.  She has been participating in physical therapy and has seen improvements in her back pain but by no means feels significantly better.  He still has a tightness in her low back with a "knot palpable.  She complains of pain and numbness in her right leg.  We ended up ordering an MRI which is finally done on October 5.  Results are as follows:   1. No acute osseous abnormality. Stable L4-5 grade 1 anterolisthesis with prominent facet arthropathy. 2. Mild progression of lumbar spine degenerative changes at the L2-3 level. Overall stable degenerative changes at additional lumbar levels. 3. Moderate to severe L2-3, mild L4-5, and mild L5-S1 canal stenosis. 4. L5-S1 central protrusion effaces lateral recesses with contact on descending S1 nerve roots. 5. Multilevel mild neural foraminal stenosis. No high-grade foraminal stenosis. 6. Stable diffusely decreased bone marrow signal is probably related to history of secondary renal hyperparathyroidism and bony sclerosis.  She is using Robaxin for muscle spasm.  She uses gabapentin 100 mg in the morning and 1:59 at night for the nerve pain in her leg.  She is been more often using the 200 mg dose.  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  Pain Inventory Average Pain 6 Pain Right Now 6 My pain is sharp, tingling and aching  In the last 24 hours, has pain interfered with the following? General activity 5 Relation with others 8 Enjoyment of life 4 What TIME of day is your pain at its worst? night Sleep (in general) Fair  Pain is worse with: bending, sitting, standing and some activites Pain improves with: TENS Relief from Meds: no selection  Mobility walk without assistance how many  minutes can you walk? 30 ability to climb steps?  no do you drive?  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  no  Physicians involved in your care Any changes since last visit?  no   Family History  Problem Relation Age of Onset  . Pancreatic cancer Brother 43  . Cancer Mother   . Breast cancer Mother 64  . Lung cancer Maternal Aunt   . Breast cancer Maternal Aunt   . Prostate cancer Maternal Uncle   . Heart attack Maternal Grandmother   . Breast cancer Maternal Grandmother   . Kidney failure Maternal Aunt   . Hypertension Daughter    Social History   Socioeconomic History  . Marital status: Legally Separated    Spouse name: Not on file  . Number of children: 4  . Years of education: Not on file  . Highest education level: Not on file  Occupational History    Employer: [employer]  Social Needs  . Financial resource strain: Not on file  . Food insecurity:    Worry: Not on file    Inability: Not on file  . Transportation needs:    Medical: Not on file    Non-medical: Not on file  Tobacco Use  . Smoking status: Never Smoker  . Smokeless tobacco: Never Used  Substance and Sexual Activity  . Alcohol use: No  Alcohol/week: 0.0 standard drinks  . Drug use: No  . Sexual activity: Not on file  Lifestyle  . Physical activity:    Days per week: Not on file    Minutes per session: Not on file  . Stress: Not on file  Relationships  . Social connections:    Talks on phone: Not on file    Gets together: Not on file    Attends religious service: Not on file    Active member of club or organization: Not on file    Attends meetings of clubs or organizations: Not on file    Relationship status: Not on file  Other Topics Concern  . Not on file  Social History Narrative  . Not on file   Past Surgical History:  Procedure Laterality Date  . ABDOMINAL  HYSTERECTOMY    . BILATERAL SALPINGOOPHORECTOMY  8/03   Lap BSO (R ovarian fibroma) and adhesion lysis  . BOWEL RESECTION  2003   ileocolectomy with anastomosis 2/2 adhesions  . BREAST BIOPSY    . BREAST LUMPECTOMY Right   . BREAST LUMPECTOMY WITH NEEDLE LOCALIZATION AND AXILLARY SENTINEL LYMPH NODE BX Right 04/22/2013   Procedure: BREAST LUMPECTOMY WITH NEEDLE LOCALIZATION AND AXILLARY SENTINEL LYMPH NODE BX;  Surgeon: Stark Klein, MD;  Location: [place];  Service: General;  Laterality: Right;  . CARDIAC CATHETERIZATION     2 stents  . CHOLECYSTECTOMY    . HEMICOLECTOMY     R sided hemicolectomy  . HERNIA REPAIR     Ventral hernia repair  . PTCA  4/06   Past Medical History:  Diagnosis Date  . Abdominal discomfort    Chronic N/V/D. Presumptive dx Crohn's dx per elevated p ANCA. Failed Entocort and Pentasa. Sep 2003 - ileocolectomy c anastomosis per Dr Deon Pilling 2/2 adhesions - path was hegative for Crohns. EGD, Sm bowel follow through (11/03), and an eteroclysis (10/03) were unrevealing. Cuases hypomag and hypocalcemia.  . Adnexal mass 8/03   s/p lap BSO (R ovarian fibroma) & lysis of adhesions  . Allergy    Seasonal  . Anemia    Multifactorial. Baseline HgB 10-11 ish. B12 def - 150 in 3/10. Fe Def - ferritin 35 3/10. Both are being repleted.  . Breast cancer ([place]) 03/16/13   right, 5 o'clock  . CAD (coronary artery disease) 1996   1996 - PTCA and angioplasty diagonal branch. 2000 - Rotoblator & angiopllasty of diagonal. 2006 - subendocardial AMI, DES to proximal LAD.Marland Kitchen Also had 90% stenosis in distal apical LAD. EF 55 with apical hypokinesis. Indefinite ASA and Plavix.  . CHF (congestive heart failure) ([place])   . Chronic kidney disease    Chronic renal insuff baseline Cr 1.2 - 1.4 ish.  . Chronic pain    CT 10/10 = Spinal stenosis L2 - S1.  . Diabetes mellitus    Insulin dependent  . Hx of radiation therapy 06/02/13- 07/16/13   right rbeast 4500 cGy 25 sessions,  right breast boost 1600 cGy in 8 sessions  . Hyperlipidemia    Managed with both a statin and Welchol. Welchol stopped 2014 2/2 cost and started on fenofibrate   . Hypertension    2006 B renal arteries patent. 2003 MRA - no RAS. 2003 pheo W/U Dr Hassell Done reportedly negative.  . Hypoxia 07/23/2017  . Lupus ([place])   . Personal history of radiation therapy   . RBBB   . SBO (small bowel obstruction) ([place]) 09/17/2017  . Stroke [facility])  Incidental finding MRI 2002 L lacunar infarct  . Wears dentures    top   BP 128/64   Pulse 67   Resp 14   Ht 5' 3"  (1.6 m)   Wt 179 lb (81.2 kg)   SpO2 94%   BMI 31.71 kg/m   Opioid Risk Score:   Fall Risk Score:  `1  Depression screen PHQ 2/9  Depression screen [facility] 2/9 07/23/2017 04/18/2017 12/20/2016 10/16/2016 10/11/2016 06/11/2016 05/24/2016  Decreased Interest 0 0 0 0 0 0 0  Down, Depressed, Hopeless 0 0 0 0 0 0 0  PHQ - 2 Score 0 0 0 0 0 0 0  Some recent data might be hidden    Review of Systems  Constitutional: Positive for unexpected weight change.  HENT: Negative.   Eyes: Negative.   Respiratory: Negative.   Cardiovascular: Negative.   Gastrointestinal: Negative.   Endocrine: Negative.        High blood sugar  Genitourinary: Negative.   Musculoskeletal: Positive for back pain.       Spasms   Allergic/Immunologic: Negative.   Neurological: Positive for weakness.  Hematological: Negative.   Psychiatric/Behavioral: Negative.   All other systems reviewed and are negative.      Objective:   Physical Exam General: No acute distress HEENT: EOMI, oral membranes moist Cards: reg rate  Chest: normal effort Abdomen: Soft, NT, ND Skin: dry, intact Extremities: no edema Musc: Lumbar paraspinals tight along righ tside. Most TTP along mid lumbar spine. SLR +, SST +.    pain with bending and has difficulty sitting up/down Neurological:  Alert. Tends to drop into the right leg with weightbearing on that side during gait. Motor:Strength is  5 out of 5 UE. RLE with inconsistent weakness in KE, ADF and HAD---a lot of pain inhibition. Sensory decreased to LT RLE. Psychiatric: pleasant       Assessment & Plan:  Medical Problem List and Plan:  1. Debility and encephalopathy secondary to ARDS/cardiogenic shock/PEA arrest /CAP/multi-medical-extubated 07/31/2017 -can work sedentary level only if she must work.  I would be careful to lift any significant weight at all at this point until she is doing better from a standpoint of her back.. 2. Pain Management: low back painmay be multifactorial. She has history of some sort of lumbar surgery in the lower lumbar spine. Her paraspinals remaint especially on the right side . -Completed outpatient physical therapy ---> HEP           -continue Robaxin for muscle spasm 500 mg every 8 hours as needed            -MRI reviewed in detail   -can start with an L2-3 ESI per Dr. Letta Pate   -made referral to NS as well given severity of stenosis at L2-3 and clinical presentation.  -increase gabapentin to 117m bid and 2060mqhs 4. Mood/delirium/insomnia:Resolved/off all meds 5.Dysphonia: Seems to have improved  6. Chronic systolic congestive heart failure. -Encourage monitoring of weights at home 7. CAD/CKD/DMper primary   2515mtes were spent in direct patient contact today. Dr. KirLetta Patell see her back in about 1 months time for ESI's

## 2017-12-24 NOTE — Telephone Encounter (Signed)
I spoke with Mrs [name] and verified Dr Seward Carol is her PCP and no longer Dr Eudelia Bunch. I called and verified Dr Delfina Redwood fax number and have faxed anticoag hold form to his office, and alerted Mrs [name] that she will need to hold 3 days before procedure if ok'd by [facility]. I am mailing her the pre procedure form, told her to bring a driver and to look for the instructions in the mail from our office.

## 2017-12-25 ENCOUNTER — Ambulatory Visit: Payer: Medicare Other | Admitting: Physical Therapy

## 2017-12-25 ENCOUNTER — Encounter: Payer: Self-pay | Admitting: Physical Therapy

## 2017-12-25 DIAGNOSIS — E1169 Type 2 diabetes mellitus with other specified complication: Secondary | ICD-10-CM | POA: Diagnosis not present

## 2017-12-25 DIAGNOSIS — R262 Difficulty in walking, not elsewhere classified: Secondary | ICD-10-CM

## 2017-12-25 DIAGNOSIS — M5441 Lumbago with sciatica, right side: Secondary | ICD-10-CM | POA: Diagnosis not present

## 2017-12-25 DIAGNOSIS — L989 Disorder of the skin and subcutaneous tissue, unspecified: Secondary | ICD-10-CM | POA: Diagnosis not present

## 2017-12-25 DIAGNOSIS — G8929 Other chronic pain: Secondary | ICD-10-CM

## 2017-12-25 DIAGNOSIS — R29898 Other symptoms and signs involving the musculoskeletal system: Secondary | ICD-10-CM | POA: Diagnosis not present

## 2017-12-25 NOTE — Therapy (Signed)
[place], [place], [address] Phone: [phone]   Fax:  [phone]  Physical Therapy Treatment/Discharge  Patient Details  Name: [name] MRN: [id number] Date of Birth: May 04, 1945 Referring Provider (PT): Daleen Squibb MD   Encounter Date: 12/25/2017  PT End of Session - 12/25/17 1738    Visit Number  6    Number of Visits  10    Date for PT Re-Evaluation  02/03/18    Authorization Type  [employer] MCR and secondary is champ [place], no authorization required or visit limit    PT Start Time  1628    PT Stop Time  1724    PT Time Calculation (min)  56 min    Activity Tolerance  Patient tolerated treatment well    Behavior During Therapy  [facility] for tasks assessed/performed       Past Medical History:  Diagnosis Date  . Abdominal discomfort    Chronic N/V/D. Presumptive dx Crohn's dx per elevated p ANCA. Failed Entocort and Pentasa. Sep 2003 - ileocolectomy c anastomosis per Dr Deon Pilling 2/2 adhesions - path was hegative for Crohns. EGD, Sm bowel follow through (11/03), and an eteroclysis (10/03) were unrevealing. Cuases hypomag and hypocalcemia.  . Adnexal mass 8/03   s/p lap BSO (R ovarian fibroma) & lysis of adhesions  . Allergy    Seasonal  . Anemia    Multifactorial. Baseline HgB 10-11 ish. B12 def - 150 in 3/10. Fe Def - ferritin 35 3/10. Both are being repleted.  . Breast cancer ([place]) 03/16/13   right, 5 o'clock  . CAD (coronary artery disease) 1996   1996 - PTCA and angioplasty diagonal branch. 2000 - Rotoblator & angiopllasty of diagonal. 2006 - subendocardial AMI, DES to proximal LAD.Marland Kitchen Also had 90% stenosis in distal apical LAD. EF 55 with apical hypokinesis. Indefinite ASA and Plavix.  . CHF (congestive heart failure) ([place])   . Chronic kidney disease    Chronic renal insuff baseline Cr 1.2 - 1.4 ish.  . Chronic pain    CT 10/10 = Spinal stenosis L2 - S1.  . Diabetes mellitus    Insulin dependent  . Hx  of radiation therapy 06/02/13- 07/16/13   right rbeast 4500 cGy 25 sessions, right breast boost 1600 cGy in 8 sessions  . Hyperlipidemia    Managed with both a statin and Welchol. Welchol stopped 2014 2/2 cost and started on fenofibrate   . Hypertension    2006 B renal arteries patent. 2003 MRA - no RAS. 2003 pheo W/U Dr Hassell Done reportedly negative.  . Hypoxia 07/23/2017  . Lupus ([place])   . Personal history of radiation therapy   . RBBB   . SBO (small bowel obstruction) ([place]) 09/17/2017  . Stroke [facility])    Incidental finding MRI 2002 L lacunar infarct  . Wears dentures    top    Past Surgical History:  Procedure Laterality Date  . ABDOMINAL HYSTERECTOMY    . BILATERAL SALPINGOOPHORECTOMY  8/03   Lap BSO (R ovarian fibroma) and adhesion lysis  . BOWEL RESECTION  2003   ileocolectomy with anastomosis 2/2 adhesions  . BREAST BIOPSY    . BREAST LUMPECTOMY Right   . BREAST LUMPECTOMY WITH NEEDLE LOCALIZATION AND AXILLARY SENTINEL LYMPH NODE BX Right 04/22/2013   Procedure: BREAST LUMPECTOMY WITH NEEDLE LOCALIZATION AND AXILLARY SENTINEL LYMPH NODE BX;  Surgeon: Stark Klein, MD;  Location: [place];  Service: General;  Laterality: Right;  . CARDIAC  CATHETERIZATION     2 stents  . CHOLECYSTECTOMY    . HEMICOLECTOMY     R sided hemicolectomy  . HERNIA REPAIR     Ventral hernia repair  . PTCA  4/06    There were no vitals filed for this visit.  Subjective Assessment - 12/25/17 1731    Subjective  Pt. had follow up with Dr. Naaman Plummer yesterday. MRI findings of significant stenosis along with L5-S1 disc protrusion with potential nerve compression-pt. has been referred for [facility] as well as consult with neurosurgeon. Mild benefit for leg symptoms from exercises last session but continues with pain.    Pertinent History  PMH: Anemia, Breast cancer, CAD,CHF,MI, Chronic kidney disease, Chronic pain, DM, radiation therapy (06/02/13- 07/16/13), Hyperlipidemia, HTN, Hypoxia. SOB     Limitations  Sitting;Lifting;Standing;Walking;House hold activities    How long can you sit comfortably?  varies but at least 10-15 minutes    How long can you stand comfortably?  20 minutes    How long can you walk comfortably?  10 minutes    Diagnostic tests  MRI, X-rays    Currently in Pain?  Yes    Pain Score  4     Pain Location  Back    Pain Orientation  Right    Pain Descriptors / Indicators  Burning;Aching    Pain Type  Chronic pain    Pain Radiating Towards  Rt. LE distally to foot    Pain Onset  More than a month ago    Pain Frequency  Intermittent    Aggravating Factors   sitting, standing, walking    Pain Relieving Factors  rest, heat, estim    Effect of Pain on Daily Activities  limited tolerance for chores, IADLs, positional tolerance for sitting and standing, walking    Multiple Pain Sites  No                       [facility] Adult PT Treatment/Exercise - 12/25/17 0001      Exercises   Exercises  Lumbar      Lumbar Exercises: Stretches   Passive Hamstring Stretch  3 reps;Left;30 seconds   held right side due to pain   Single Knee to Chest Stretch  --   1x10 bilat. 5 sec holds   Lower Trunk Rotation  --   2x10 left side with legs on 65 cm ball   Piriformis Stretch  Left;Right;3 reps;30 seconds      Lumbar Exercises: Supine   Pelvic Tilt  20 reps   tactile cues for form   Bridge  10 reps    Other Supine Lumbar Exercises  supine clam with red band 2x10      Moist Heat Therapy   Number Minutes Moist Heat  10 Minutes    Moist Heat Location  Lumbar Spine      Electrical Stimulation   Electrical Stimulation Location  lumbar    Electrical Stimulation Action  IFC    Electrical Stimulation Parameters  to tolerance    Electrical Stimulation Goals  Pain      Manual Therapy   Manual Therapy  Soft tissue mobilization    Soft tissue mobilization  Right lumbar paraspinals    avoiding region of skin wound            PT Education - 12/25/17 1737     Education Details  HEP updates, review spine anatomy with MRI results    Person(s) Educated  Patient  Methods  Explanation;Demonstration;Verbal cues    Comprehension  Verbalized understanding;Returned demonstration          PT Long Term Goals - 12/25/17 1741      PT LONG TERM GOAL #1   Title  Pt will be I and compliant with HEP.    Time  5    Period  Weeks    Status  Achieved      PT LONG TERM GOAL #2   Title  Pt will improve FOTO to less than 55% limited.    Time  5    Period  Weeks    Status  Not Met      PT LONG TERM GOAL #3   Title  Pt will improve lumbar ROM to [facility]. 5 weeks 01/27/18    Time  5    Period  Weeks    Status  Not Met      PT LONG TERM GOAL #4   Title  Pt will improve Rt LE strength to overall 4+/5 to 5-/5 grossly.     Time  5    Period  Weeks    Status  Not Met      PT LONG TERM GOAL #5   Title  Pt will decrease overall pain to less than 3/10 with usual activity.    Time  5    Period  Weeks    Status  Not Met            Plan - 12/25/17 1739    Clinical Impression Statement  Mild improvement with therapy but overall continues with pain and associated functional limitations. Complex lumbar etiology with stenosis and disc protrusion. At this point per MD follow up plan d/c to HEP and pt. will follow up with MDs for consults as noted in subjective.    Clinical Presentation  Evolving    Clinical Presentation due to:  comorbidities, MRI results    Clinical Decision Making  Moderate    Rehab Potential  Fair    Clinical Impairments Affecting Rehab Potential  multiple co morbidiites and deconditioning, pnemonia    PT Frequency  --   d/c to HEP today   PT Duration  --   d/c to HEP today   PT Treatment/Interventions  Cryotherapy;Electrical Stimulation;Iontophoresis 40m/ml Dexamethasone;Moist Heat;Traction;Ultrasound;Therapeutic activities;Therapeutic exercise;Balance training;Neuromuscular re-education;Manual techniques;Passive range of  motion;Dry needling;Vasopneumatic Device;Taping    Consulted and Agree with Plan of Care  Patient       Patient will benefit from skilled therapeutic intervention in order to improve the following deficits and impairments:  Abnormal gait, Decreased activity tolerance, Decreased balance, Decreased endurance, Decreased range of motion, Difficulty walking, Decreased strength, Hypomobility, Increased fascial restricitons, Increased muscle spasms, Impaired flexibility, Pain, Postural dysfunction  Visit Diagnosis: Chronic right-sided low back pain with right-sided sciatica  Difficulty in walking, not elsewhere classified  Muscular deconditioning     Problem List Patient Active Problem List   Diagnosis Date Noted  . Spondylosis of lumbar spine 12/24/2017  . Renal vein thrombosis ([place] 09/17/2017  . Hoarse voice quality 09/09/2017  . Low back pain without sciatica 09/09/2017  . Labile blood glucose   . Sleep disturbance   . Leukocytosis   . Essential hypertension   . Urinary frequency   . Type 2 diabetes mellitus with peripheral neuropathy (HCC)   . Benign essential HTN   . Stage 2 chronic kidney disease   . Dysphagia   . Debility   . Encephalopathy   . Pressure injury of  skin 08/04/2017  . Pneumonia of left lung due to infectious organism   . Diffuse pulmonary alveolar hemorrhage   . SOB (shortness of breath) 10/11/2016  . Hyperparathyroidism, secondary renal ([place]) 05/02/2016  . Gout 04/11/2015  . Diabetic neuropathy associated with type 2 diabetes mellitus ([place]) 01/06/2015  . SBO (small bowel obstruction) ([place]) 09/15/2013  . Breast cancer of lower-inner quadrant of right female breast ([place]) 03/18/2013  . Chronic venous insufficiency 01/06/2013  . Health care maintenance 05/08/2011  . Diabetes mellitus type 2 in obese ([place]) 05/03/2010  . Seasonal allergies 05/03/2010  . Labile hypertension   . CAD (coronary artery disease)   . Abdominal discomfort   . Anemia in chronic  kidney disease   . History of stroke without residual deficits   . Hyperlipidemia associated with type 2 diabetes mellitus ([place])   . Chronic pain   . Chronic kidney disease, stage III (moderate) (HCC)   . Adnexal mass   . Spinal stenosis, lumbar region, with neurogenic claudication 01/03/2009    Beaulah Dinning, PT, DPT 12/25/17 5:49 PM  [address] [facility] [address] [place], [place], [address] Phone: [phone]   Fax:  [phone]  Name: [name] MRN: [id number] Date of Birth: 1945-12-04      PHYSICAL THERAPY DISCHARGE SUMMARY  Visits from Start of Care: 7  Current functional level related to goals / functional outcomes: See progress note last session 12/24/15 for further details. D/c to HEP, pt. Will continue via HEP and follow up as referred by MD for [facility] and [facility] consult.   Remaining deficits: Limited sitting/standing/walking tolerance, muscular weakness and deconditioning   Education / Equipment: HEP, POC Plan: Patient agrees to discharge.  Patient goals were not met. Patient is being discharged due to lack of progress.  ?????

## 2017-12-27 ENCOUNTER — Ambulatory Visit: Payer: Medicare Other | Admitting: Internal Medicine

## 2017-12-31 ENCOUNTER — Telehealth: Payer: Self-pay

## 2017-12-31 NOTE — Telephone Encounter (Signed)
[facility] called stating they received a prescription for Gabapentin 100 mg for pt. And states pt has been on Neurontin 300 mg from Dr. Lynnae January prescribed. Is [facility] to discontinue the Neurontin and only fill Gabapentin?

## 2018-01-01 NOTE — Telephone Encounter (Signed)
Last note: -increase gabapentin to 111m bid and 2072mqhs  (pt told me she had been using 100 am and 100-200 at night previously)----will need to confirm with patient what she's taking because I specifically asked her and this is what I was told.

## 2018-01-06 NOTE — Telephone Encounter (Signed)
Received fax from Dr Seward Carol for the anticoag hold request.  He is referring the request to [place].  I have sent a new fax request to Dr Truitt Merle. Looks like the note dated 10/16/17 she states may stop after 3 months. Xarelto was initiated 09/18/17.  Will wait for her reply.

## 2018-01-07 NOTE — Telephone Encounter (Signed)
I called pt to find out how she is taking her medication. She states to disregard the message from [facility] she was able to straighten it out. Pt states she takes 300 mg 2 tablets in the am and 2 tablets in the pm.

## 2018-01-08 DIAGNOSIS — L98429 Non-pressure chronic ulcer of back with unspecified severity: Secondary | ICD-10-CM | POA: Diagnosis not present

## 2018-01-08 DIAGNOSIS — L98428 Non-pressure chronic ulcer of back with other specified severity: Secondary | ICD-10-CM | POA: Diagnosis not present

## 2018-01-09 ENCOUNTER — Other Ambulatory Visit: Payer: Self-pay

## 2018-01-09 DIAGNOSIS — H2511 Age-related nuclear cataract, right eye: Secondary | ICD-10-CM | POA: Diagnosis not present

## 2018-01-09 DIAGNOSIS — M545 Low back pain, unspecified: Secondary | ICD-10-CM

## 2018-01-09 DIAGNOSIS — Z961 Presence of intraocular lens: Secondary | ICD-10-CM | POA: Diagnosis not present

## 2018-01-09 DIAGNOSIS — M5136 Other intervertebral disc degeneration, lumbar region: Secondary | ICD-10-CM

## 2018-01-09 DIAGNOSIS — H25811 Combined forms of age-related cataract, right eye: Secondary | ICD-10-CM | POA: Diagnosis not present

## 2018-01-09 MED ORDER — METHOCARBAMOL 500 MG PO TABS: 500.0000 mg | ORAL_TABLET | Freq: Three times a day (TID) | ORAL | 1 refills | Status: DC | PRN

## 2018-01-13 DIAGNOSIS — D638 Anemia in other chronic diseases classified elsewhere: Secondary | ICD-10-CM | POA: Diagnosis not present

## 2018-01-13 DIAGNOSIS — E1169 Type 2 diabetes mellitus with other specified complication: Secondary | ICD-10-CM | POA: Diagnosis not present

## 2018-01-20 NOTE — Progress Notes (Signed)
Patient ID: [name], female   DOB: 30-May-1945, 72 y.o.   MRN: [id number]      [name] is seen today for F/U of CAD with pevious rotoblator to the LAD in 2000. Her last myovue on 11/2006 showed mild apical thinning and was low risk. She has not had any SSCP and has been compliant with her meds.Her BP is alwyas hard to Rx and she is on multiple drugs with persistant mild tachycardia. She denies dyspnea, PND orthopnea. Has some swelling in her feet. Diet still an issue with her DM.   June admitted with sepsis syndrome pneumonia She had alveolar hemorrhage and brief PEA Arrest post bronchoscopy and was intubated for days and d/c to inpatient rehab   Breast cancer and sees [name] was on Arimidex but hospitalized in July with SBO pneumonia and pancreatic cyst. Noted to have renal vein thrombosis and placed on xarelto For 6 months   Last TTE done 07/27/17 reviewed EF 55-60% Moderate MR  Estimated PA pressure 64 mmHg  She has some pain in chest with activity and after eating Not severe and only lasts minutes Nitro gives her headache and "she hasn't needed it"   Has 4 children in area that keep in touch with her regularly One is a NP  ROS: Denies fever, malais, weight loss, blurry vision, decreased visual acuity, cough, sputum, SOB, hemoptysis, pleuritic pain, palpitaitons, heartburn, abdominal pain, melena, lower extremity edema, claudication, or rash.  All other systems reviewed and negative  General: Affect appropriate Overweight black female  HEENT: normal Neck supple with no adenopathy JVP normal no bruits no thyromegaly Lungs clear with no wheezing and good diaphragmatic motion Heart:  S1/S2 2/6 MR  murmur, no rub, gallop or click PMI normal Abdomen: benighn, BS positve, no tenderness, no AAA no bruit.  No HSM or HJR Distal pulses intact with no bruits Plus one RLE  Edema Plus 2 LLE  Neuro non-focal Skin warm and dry Arthritis in hands    Current Outpatient Medications    Medication Sig Dispense Refill  . acetaminophen (TYLENOL) 325 MG tablet Take 650 mg by mouth every 6 (six) hours as needed for mild pain.    Marland Kitchen amLODipine (NORVASC) 10 MG tablet Take 1 tablet (10 mg total) by mouth daily. 30 tablet 0  . atorvastatin (LIPITOR) 40 MG tablet Take 1 tablet (40 mg total) by mouth at bedtime. 90 tablet 3  . carvedilol (COREG) 25 MG tablet Take 25 mg by mouth as directed.    . desloratadine (CLARINEX) 5 MG tablet Take 5 mg by mouth at bedtime as needed (allergies). Reported on 04/11/2015    . fenofibrate 54 MG tablet Take 54 mg by mouth daily.     . ferrous fumarate (HEMOCYTE - 106 MG FE) 325 (106 Fe) MG TABS tablet Take 1 tablet by mouth as directed.    . furosemide (LASIX) 40 MG tablet Take 40 mg by mouth as directed.    . gabapentin (NEURONTIN) 100 MG capsule Take 1 capsule (100 mg total) by mouth 4 (four) times daily. 120 capsule 3  . linagliptin (TRADJENTA) 5 MG TABS tablet Take 5 mg by mouth daily.    . methocarbamol (ROBAXIN) 500 MG tablet Take 1 tablet (500 mg total) by mouth every 8 (eight) hours as needed for muscle spasms. 60 tablet 1  . nitroGLYCERIN (NITROSTAT) 0.4 MG SL tablet Place 1 tablet (0.4 mg total) under the tongue every 5 (five) minutes as needed for chest pain. 25  tablet 3  . pantoprazole (PROTONIX) 40 MG tablet Take 1 tablet (40 mg total) by mouth daily. 30 tablet 3  . potassium chloride SA (K-DUR,KLOR-CON) 20 MEQ tablet Take 1 tablet (20 mEq total) by mouth daily as needed (when taking lasix). 30 tablet 1  . rivaroxaban (XARELTO) 20 MG TABS tablet Take 1 tablet (20 mg total) by mouth daily with supper. 30 tablet 4  . ULORIC 40 MG tablet Take 1 tablet by mouth daily.     No current facility-administered medications for this visit.    Facility-Administered Medications Ordered in Other Visits  Medication Dose Route Frequency Provider Last Rate Last Dose  . 0.9 %  sodium chloride infusion   Intravenous Continuous Katie Estelle, MD 150 mL/hr at  10/03/15 1607      Allergies  Percocet [oxycodone-acetaminophen]; Diazepam; Haloperidol lactate; Lorazepam; Morphine sulfate; Propoxyphene hcl; Tramadol hcl; and Anesthetics, halogenated  Electrocardiogram:  08/26/17 SR rate 87 RBBB nonspecific ST changes   Assessment and Plan  CAD: Stable with no angina and good activity level.  Continue medical Rx update myovue In light of chest pain consider adding LA imdur   HTN: ARB stopped in hospital and placed on norvasc despite previous edema stable at this time but may need to change in future  Chol: Cholesterol is at goal.  Continue current dose of statin and diet Rx.   DM: Discussed low carb diet.  Target hemoglobin A1c is 6.5 or less.  Continue current medications.  Breast Cancer f/u oncology post XRT  RBBB ECG stable no change   Neuropathy stable continue neurontin Arthritis especially in hands Discussed referral to [name]   Edema:  Etiology not clear EF normal only grade one diastolic  Venous duplex normal Continue lasix    [name]

## 2018-01-21 ENCOUNTER — Other Ambulatory Visit: Payer: Self-pay

## 2018-01-21 NOTE — Patient Outreach (Signed)
[facility]) Care Management  01/21/2018  [name] September 15, 1945 [id number]   Medication Adherence call to [name] left a message for patient to call back patient is due on Losartan 50 mg. [name] is showing past due under [place].   [facility] Management Direct Dial [phone]  Fax [phone] [name]

## 2018-01-27 ENCOUNTER — Telehealth: Payer: Self-pay

## 2018-01-27 ENCOUNTER — Ambulatory Visit (INDEPENDENT_AMBULATORY_CARE_PROVIDER_SITE_OTHER)
Admission: RE | Admit: 2018-01-27 | Discharge: 2018-01-27 | Disposition: A | Discharge status: Home / Self Care | Payer: Medicare Other | Source: Ambulatory Visit | Attending: Internal Medicine | Admitting: Internal Medicine

## 2018-01-27 ENCOUNTER — Other Ambulatory Visit: Payer: Self-pay | Admitting: Internal Medicine

## 2018-01-27 ENCOUNTER — Other Ambulatory Visit: Payer: Self-pay | Admitting: Hematology

## 2018-01-27 ENCOUNTER — Ambulatory Visit: Payer: Medicare Other | Admitting: Physical Medicine & Rehabilitation

## 2018-01-27 DIAGNOSIS — Z803 Family history of malignant neoplasm of breast: Secondary | ICD-10-CM | POA: Insufficient documentation

## 2018-01-27 DIAGNOSIS — Z9049 Acquired absence of other specified parts of digestive tract: Secondary | ICD-10-CM

## 2018-01-27 DIAGNOSIS — Z8042 Family history of malignant neoplasm of prostate: Secondary | ICD-10-CM | POA: Insufficient documentation

## 2018-01-27 DIAGNOSIS — Z9889 Other specified postprocedural states: Secondary | ICD-10-CM

## 2018-01-27 DIAGNOSIS — I5022 Chronic systolic (congestive) heart failure: Secondary | ICD-10-CM | POA: Insufficient documentation

## 2018-01-27 DIAGNOSIS — I4510 Unspecified right bundle-branch block: Secondary | ICD-10-CM

## 2018-01-27 DIAGNOSIS — R5381 Other malaise: Secondary | ICD-10-CM

## 2018-01-27 DIAGNOSIS — E785 Hyperlipidemia, unspecified: Secondary | ICD-10-CM

## 2018-01-27 DIAGNOSIS — Z8673 Personal history of transient ischemic attack (TIA), and cerebral infarction without residual deficits: Secondary | ICD-10-CM

## 2018-01-27 DIAGNOSIS — J189 Pneumonia, unspecified organism: Secondary | ICD-10-CM

## 2018-01-27 DIAGNOSIS — Z955 Presence of coronary angioplasty implant and graft: Secondary | ICD-10-CM

## 2018-01-27 DIAGNOSIS — Z800 Family history of malignant neoplasm of digestive organs: Secondary | ICD-10-CM

## 2018-01-27 DIAGNOSIS — M48061 Spinal stenosis, lumbar region without neurogenic claudication: Secondary | ICD-10-CM

## 2018-01-27 DIAGNOSIS — E11649 Type 2 diabetes mellitus with hypoglycemia without coma: Secondary | ICD-10-CM | POA: Insufficient documentation

## 2018-01-27 DIAGNOSIS — K5090 Crohn's disease, unspecified, without complications: Secondary | ICD-10-CM | POA: Insufficient documentation

## 2018-01-27 DIAGNOSIS — Z801 Family history of malignant neoplasm of trachea, bronchus and lung: Secondary | ICD-10-CM

## 2018-01-27 DIAGNOSIS — E1122 Type 2 diabetes mellitus with diabetic chronic kidney disease: Secondary | ICD-10-CM

## 2018-01-27 DIAGNOSIS — Z90710 Acquired absence of both cervix and uterus: Secondary | ICD-10-CM | POA: Insufficient documentation

## 2018-01-27 DIAGNOSIS — Z8674 Personal history of sudden cardiac arrest: Secondary | ICD-10-CM | POA: Insufficient documentation

## 2018-01-27 DIAGNOSIS — G8929 Other chronic pain: Secondary | ICD-10-CM | POA: Insufficient documentation

## 2018-01-27 DIAGNOSIS — Z8709 Personal history of other diseases of the respiratory system: Secondary | ICD-10-CM | POA: Insufficient documentation

## 2018-01-27 DIAGNOSIS — I2510 Atherosclerotic heart disease of native coronary artery without angina pectoris: Secondary | ICD-10-CM | POA: Insufficient documentation

## 2018-01-27 DIAGNOSIS — Z794 Long term (current) use of insulin: Secondary | ICD-10-CM | POA: Insufficient documentation

## 2018-01-27 DIAGNOSIS — I130 Hypertensive heart and chronic kidney disease with heart failure and stage 1 through stage 4 chronic kidney disease, or unspecified chronic kidney disease: Secondary | ICD-10-CM | POA: Insufficient documentation

## 2018-01-27 DIAGNOSIS — R490 Dysphonia: Secondary | ICD-10-CM

## 2018-01-27 DIAGNOSIS — Z841 Family history of disorders of kidney and ureter: Secondary | ICD-10-CM

## 2018-01-27 DIAGNOSIS — G9340 Encephalopathy, unspecified: Secondary | ICD-10-CM

## 2018-01-27 DIAGNOSIS — I252 Old myocardial infarction: Secondary | ICD-10-CM | POA: Insufficient documentation

## 2018-01-27 DIAGNOSIS — Z8249 Family history of ischemic heart disease and other diseases of the circulatory system: Secondary | ICD-10-CM

## 2018-01-27 DIAGNOSIS — Z853 Personal history of malignant neoplasm of breast: Secondary | ICD-10-CM | POA: Insufficient documentation

## 2018-01-27 DIAGNOSIS — M329 Systemic lupus erythematosus, unspecified: Secondary | ICD-10-CM | POA: Insufficient documentation

## 2018-01-27 DIAGNOSIS — E1142 Type 2 diabetes mellitus with diabetic polyneuropathy: Secondary | ICD-10-CM

## 2018-01-27 NOTE — Telephone Encounter (Signed)
Patient calls stating she was going to get an epidural steroid injection and Dr. Candise Che office would not do it because she is on Xarelto.  1) She wants to know how long she can stop it to get the injection?  2) She wants to know how long she will have to be on this medication.  I explained to her I will check with Dr. Burr Medico and call her back.  Patient verbalized an understanding.

## 2018-01-27 NOTE — Telephone Encounter (Signed)
Spoke with Dr. Burr Medico and called patient instructed her she can stop the Xarelto, we will get lab work on 11/20 at her appointment to see if she can stay off the medication.  Patient verbalized an understanding. Will fax a note to Dr. Candise Che office telling them we have stopped it.

## 2018-01-28 ENCOUNTER — Telehealth: Payer: Self-pay | Admitting: *Deleted

## 2018-01-28 NOTE — Telephone Encounter (Signed)
Due to still being on xarelto, Trans Lesi injection could not be done. She has contacted Dr Burr Medico and it has been stopped. She should reschedule when determined if she may remain off anticoagulant.

## 2018-01-29 ENCOUNTER — Ambulatory Visit (INDEPENDENT_AMBULATORY_CARE_PROVIDER_SITE_OTHER): Payer: Medicare Other | Admitting: Cardiovascular Disease

## 2018-01-29 ENCOUNTER — Encounter: Payer: Self-pay | Admitting: Cardiovascular Disease

## 2018-01-29 VITALS — BP 148/64 | HR 84 | Ht 63.0 in | Wt 183.0 lb

## 2018-01-29 DIAGNOSIS — R079 Chest pain, unspecified: Secondary | ICD-10-CM | POA: Diagnosis not present

## 2018-01-29 DIAGNOSIS — I2510 Atherosclerotic heart disease of native coronary artery without angina pectoris: Secondary | ICD-10-CM | POA: Diagnosis not present

## 2018-01-29 NOTE — Patient Instructions (Signed)
Medication Instructions:   If you need a refill on your cardiac medications before your next appointment, please call your pharmacy.   Lab work:  If you have labs (blood work) drawn today and your tests are completely normal, you will receive your results only by: Marland Kitchen MyChart Message (if you have MyChart) OR . A paper copy in the mail If you have any lab test that is abnormal or we need to change your treatment, we will call you to review the results.  Testing/Procedures: Your physician has requested that you have en exercise stress myoview. For further information please visit [email or web address]. Please follow instruction sheet, as given.  Follow-Up: At [facility], you and your health needs are our priority.  As part of our continuing mission to provide you with exceptional heart care, we have created designated Provider Care Teams.  These Care Teams include your primary Cardiologist (physician) and Advanced Practice Providers (APPs -  Physician Assistants and Nurse Practitioners) who all work together to provide you with the care you need, when you need it. You will need a follow up appointment in 6 months.  Please call our office 2 months in advance to schedule this appointment.  You may see [name], MD or one of the following Advanced Practice Providers on your designated Care Team:   Truitt Merle, NP Cecilie Kicks, NP . Kathyrn Drown, NP

## 2018-02-03 ENCOUNTER — Ambulatory Visit: Payer: Medicare Other | Admitting: Physical Medicine & Rehabilitation

## 2018-02-03 ENCOUNTER — Encounter: Payer: Self-pay | Admitting: Physical Medicine & Rehabilitation

## 2018-02-03 ENCOUNTER — Encounter: Payer: Medicare Other | Attending: Physical Medicine & Rehabilitation

## 2018-02-03 VITALS — BP 152/67 | HR 83 | Resp 14 | Ht 64.0 in | Wt 178.0 lb

## 2018-02-03 DIAGNOSIS — M48061 Spinal stenosis, lumbar region without neurogenic claudication: Secondary | ICD-10-CM | POA: Diagnosis not present

## 2018-02-03 DIAGNOSIS — G8929 Other chronic pain: Secondary | ICD-10-CM | POA: Diagnosis not present

## 2018-02-03 DIAGNOSIS — R490 Dysphonia: Secondary | ICD-10-CM | POA: Diagnosis not present

## 2018-02-03 DIAGNOSIS — Z9889 Other specified postprocedural states: Secondary | ICD-10-CM | POA: Diagnosis not present

## 2018-02-03 DIAGNOSIS — Z803 Family history of malignant neoplasm of breast: Secondary | ICD-10-CM | POA: Diagnosis not present

## 2018-02-03 DIAGNOSIS — Z9049 Acquired absence of other specified parts of digestive tract: Secondary | ICD-10-CM | POA: Diagnosis not present

## 2018-02-03 DIAGNOSIS — K5090 Crohn's disease, unspecified, without complications: Secondary | ICD-10-CM | POA: Diagnosis not present

## 2018-02-03 DIAGNOSIS — E1122 Type 2 diabetes mellitus with diabetic chronic kidney disease: Secondary | ICD-10-CM | POA: Diagnosis not present

## 2018-02-03 DIAGNOSIS — I5022 Chronic systolic (congestive) heart failure: Secondary | ICD-10-CM | POA: Diagnosis not present

## 2018-02-03 DIAGNOSIS — Z800 Family history of malignant neoplasm of digestive organs: Secondary | ICD-10-CM | POA: Diagnosis not present

## 2018-02-03 DIAGNOSIS — E11649 Type 2 diabetes mellitus with hypoglycemia without coma: Secondary | ICD-10-CM | POA: Diagnosis not present

## 2018-02-03 DIAGNOSIS — I130 Hypertensive heart and chronic kidney disease with heart failure and stage 1 through stage 4 chronic kidney disease, or unspecified chronic kidney disease: Secondary | ICD-10-CM | POA: Diagnosis not present

## 2018-02-03 DIAGNOSIS — Z8249 Family history of ischemic heart disease and other diseases of the circulatory system: Secondary | ICD-10-CM | POA: Diagnosis not present

## 2018-02-03 DIAGNOSIS — G9340 Encephalopathy, unspecified: Secondary | ICD-10-CM | POA: Diagnosis present

## 2018-02-03 DIAGNOSIS — Z955 Presence of coronary angioplasty implant and graft: Secondary | ICD-10-CM | POA: Diagnosis not present

## 2018-02-03 DIAGNOSIS — Z801 Family history of malignant neoplasm of trachea, bronchus and lung: Secondary | ICD-10-CM | POA: Diagnosis not present

## 2018-02-03 DIAGNOSIS — Z853 Personal history of malignant neoplasm of breast: Secondary | ICD-10-CM | POA: Diagnosis not present

## 2018-02-03 DIAGNOSIS — M5416 Radiculopathy, lumbar region: Secondary | ICD-10-CM | POA: Diagnosis not present

## 2018-02-03 DIAGNOSIS — Z8042 Family history of malignant neoplasm of prostate: Secondary | ICD-10-CM | POA: Diagnosis not present

## 2018-02-03 DIAGNOSIS — I2510 Atherosclerotic heart disease of native coronary artery without angina pectoris: Secondary | ICD-10-CM | POA: Diagnosis not present

## 2018-02-03 DIAGNOSIS — Z841 Family history of disorders of kidney and ureter: Secondary | ICD-10-CM | POA: Diagnosis not present

## 2018-02-03 DIAGNOSIS — Z8709 Personal history of other diseases of the respiratory system: Secondary | ICD-10-CM | POA: Diagnosis not present

## 2018-02-03 DIAGNOSIS — R5381 Other malaise: Secondary | ICD-10-CM | POA: Diagnosis not present

## 2018-02-03 DIAGNOSIS — Z8674 Personal history of sudden cardiac arrest: Secondary | ICD-10-CM | POA: Diagnosis not present

## 2018-02-03 DIAGNOSIS — Z90710 Acquired absence of both cervix and uterus: Secondary | ICD-10-CM | POA: Diagnosis not present

## 2018-02-03 NOTE — Patient Instructions (Signed)
You received an epidural steroid injection under fluoroscopic guidance. This is the most accurate way to perform an epidural injection. This injection was performed to relieve thigh or leg or foot pain that may be related to a pinched nerve in the lumbar spine. The local anesthetic injected today may cause numbness in your leg for a couple hours. If it is severe we may need to observe you for 30-60 minutes after the injection. The cortisone medicine injected today may take several days to take full effect. This medicine can also cause facial flushing or feeling of being warm.  This injection may last for days weeks or months. It can be repeated if needed. If it is not effective, another spinal level may need to be injected. Other treatments include medication management as well as physical therapy. In some cases surgery may be an option.  

## 2018-02-03 NOTE — Progress Notes (Signed)
  PROCEDURE RECORD [place] [facility]   Name: [name] DOB:Dec 27, 1945 MRN: [id number]  Date:02/03/2018  Physician: Alysia Penna, MD    Nurse/CMA: Marquesa Rath CMA  Allergies:  Allergies  Allergen Reactions  . Percocet [Oxycodone-Acetaminophen] Other (See Comments)    headaches  . Diazepam Other (See Comments)    REACTION: Agitation  . Haloperidol Lactate Nausea And Vomiting  . Lorazepam Nausea Only  . Morphine Sulfate Other (See Comments)    REACTION: Agitation  . Propoxyphene Hcl Nausea And Vomiting  . Tramadol Hcl Nausea And Vomiting  . Anesthetics, Halogenated     Per patient's daughter, she can not tolerate anesthetics.     Consent Signed: Yes.    Is patient diabetic? Yes.    CBG today? 116  Pregnant: No. LMP: No LMP recorded. Patient has had a hysterectomy. (age 89-55)  Anticoagulants: no Anti-inflammatory: no Antibiotics: no  Procedure: Right L2-3 Translaminar ESI  Position: Prone   Start Time: 3:51pm End Time: 3:55pm Fluoro Time: 22s  RN/CMA Rollyn Scialdone CMA Bianey Tesoro CMA    Time 3:35pm 4:01pm    BP 152/67 128/67    Pulse 83 83    Respirations 16 16    O2 Sat 97 97    S/S 6 6    Pain Level 9/10 0/10     D/C home with Granddaughter, patient A & O X 3, D/C instructions reviewed, and sits independently.

## 2018-02-03 NOTE — Progress Notes (Signed)
Lumbar epidural steroid injection under fluoroscopic guidance  Indication: Lumbosacral radiculitis is not relieved by medication management or other conservative care and interfering with self-care and mobility.  No  anticoagulant use.  Informed consent was obtained after describing risk and benefits of the procedure with the patient, this includes bleeding, bruising, infection, paralysis and medication side effects.  The patient wishes to proceed and has given written consent.  Patient was placed in a prone position.  The lumbar area was marked and prepped with Betadine.  It was entered with a 25-gauge 1-1/2 inch needle and one mL of 1% lidocaine was injected into the skin and subcutaneous tissue.  Then a 17-gauge spinal needle was inserted under fluoroscopic guidance into the L2-3 Right paramedian interlaminar space under AP and Lateral imaging.  Once needle tip of approximated the posterior elements, a loss of resistance technique was utilized with lateral imaging.  A positive loss of resistance was obtained and then confirmed by injecting 2 mL's of Omnipaque 180.  Then a solution containing 1.5 mL's of 28m/ml Celestone and 1.5 mL's of 1% lidocaine was injected.  The patient tolerated procedure well.  Post procedure instructions were given.  Please see post procedure form.

## 2018-02-05 ENCOUNTER — Inpatient Hospital Stay: Payer: Medicare Other | Attending: Hematology

## 2018-02-05 DIAGNOSIS — D631 Anemia in chronic kidney disease: Secondary | ICD-10-CM | POA: Diagnosis not present

## 2018-02-05 DIAGNOSIS — E1122 Type 2 diabetes mellitus with diabetic chronic kidney disease: Secondary | ICD-10-CM | POA: Insufficient documentation

## 2018-02-05 DIAGNOSIS — Z8674 Personal history of sudden cardiac arrest: Secondary | ICD-10-CM | POA: Diagnosis not present

## 2018-02-05 DIAGNOSIS — Z853 Personal history of malignant neoplasm of breast: Secondary | ICD-10-CM | POA: Insufficient documentation

## 2018-02-05 DIAGNOSIS — Z86718 Personal history of other venous thrombosis and embolism: Secondary | ICD-10-CM | POA: Diagnosis not present

## 2018-02-05 DIAGNOSIS — M1990 Unspecified osteoarthritis, unspecified site: Secondary | ICD-10-CM | POA: Diagnosis not present

## 2018-02-05 DIAGNOSIS — I1310 Hypertensive heart and chronic kidney disease without heart failure, with stage 1 through stage 4 chronic kidney disease, or unspecified chronic kidney disease: Secondary | ICD-10-CM | POA: Insufficient documentation

## 2018-02-05 DIAGNOSIS — M4800 Spinal stenosis, site unspecified: Secondary | ICD-10-CM | POA: Insufficient documentation

## 2018-02-05 DIAGNOSIS — I2510 Atherosclerotic heart disease of native coronary artery without angina pectoris: Secondary | ICD-10-CM | POA: Diagnosis not present

## 2018-02-05 DIAGNOSIS — I823 Embolism and thrombosis of renal vein: Secondary | ICD-10-CM | POA: Insufficient documentation

## 2018-02-05 DIAGNOSIS — Z8701 Personal history of pneumonia (recurrent): Secondary | ICD-10-CM | POA: Diagnosis not present

## 2018-02-05 DIAGNOSIS — N184 Chronic kidney disease, stage 4 (severe): Secondary | ICD-10-CM | POA: Diagnosis not present

## 2018-02-05 LAB — COMPREHENSIVE METABOLIC PANEL
ALK PHOS: 108 U/L (ref 38–126)
ALT: 18 U/L (ref 0–44)
AST: 19 U/L (ref 15–41)
Albumin: 3.7 g/dL (ref 3.5–5.0)
Anion gap: 12 (ref 5–15)
BILIRUBIN TOTAL: 0.2 mg/dL — AB (ref 0.3–1.2)
BUN: 65 mg/dL — ABNORMAL HIGH (ref 8–23)
CALCIUM: 9.5 mg/dL (ref 8.9–10.3)
CO2: 21 mmol/L — AB (ref 22–32)
CREATININE: 2.12 mg/dL — AB (ref 0.44–1.00)
Chloride: 105 mmol/L (ref 98–111)
GFR calc non Af Amer: 22 mL/min — ABNORMAL LOW (ref >60)
GFR, EST AFRICAN AMERICAN: 26 mL/min — AB (ref >60)
GLUCOSE: 214 mg/dL — AB (ref 70–99)
Potassium: 4.1 mmol/L (ref 3.5–5.1)
SODIUM: 138 mmol/L (ref 135–145)
TOTAL PROTEIN: 8.8 g/dL — AB (ref 6.5–8.1)

## 2018-02-05 LAB — CBC WITH DIFFERENTIAL/PLATELET
ABS IMMATURE GRANULOCYTES: 0.23 10*3/uL — AB (ref 0.00–0.07)
Basophils Absolute: 0 10*3/uL (ref 0.0–0.1)
Basophils Relative: 0 %
EOS PCT: 0 %
Eosinophils Absolute: 0 10*3/uL (ref 0.0–0.5)
HEMATOCRIT: 28.8 % — AB (ref 36.0–46.0)
Hemoglobin: 8.9 g/dL — ABNORMAL LOW (ref 12.0–15.0)
Immature Granulocytes: 1 %
LYMPHS ABS: 1.4 10*3/uL (ref 0.7–4.0)
LYMPHS PCT: 8 %
MCH: 26.3 pg (ref 26.0–34.0)
MCHC: 30.9 g/dL (ref 30.0–36.0)
MCV: 85.2 fL (ref 80.0–100.0)
MONO ABS: 0.6 10*3/uL (ref 0.1–1.0)
Monocytes Relative: 4 %
NEUTROS ABS: 14.2 10*3/uL — AB (ref 1.7–7.7)
Neutrophils Relative %: 87 %
Platelets: 342 10*3/uL (ref 150–400)
RBC: 3.38 MIL/uL — AB (ref 3.87–5.11)
RDW: 13.9 % (ref 11.5–15.5)
WBC: 16.4 10*3/uL — AB (ref 4.0–10.5)
nRBC: 0 % (ref 0.0–0.2)

## 2018-02-05 LAB — ANTITHROMBIN III: AntiThromb III Func: 126 % — ABNORMAL HIGH (ref 75–120)

## 2018-02-06 LAB — LUPUS ANTICOAGULANT PANEL
DRVVT: 41.2 s (ref 0.0–47.0)
PTT Lupus Anticoagulant: 29 s (ref 0.0–51.9)

## 2018-02-06 LAB — PROTEIN S, TOTAL: Protein S Ag, Total: 72 % (ref 60–150)

## 2018-02-06 LAB — PROTEIN C ACTIVITY: PROTEIN C ACTIVITY: 158 % (ref 73–180)

## 2018-02-06 LAB — HOMOCYSTEINE: Homocysteine: 27.1 umol/L — ABNORMAL HIGH (ref 0.0–15.0)

## 2018-02-06 LAB — PROTEIN S ACTIVITY: PROTEIN S ACTIVITY: 76 % (ref 63–140)

## 2018-02-07 LAB — PROTEIN C, TOTAL: Protein C, Total: 113 % (ref 60–150)

## 2018-02-07 LAB — BETA-2-GLYCOPROTEIN I ABS, IGG/M/A: BETA-2-GLYCOPROTEIN I IGA: 9 GPI IgA units (ref 0–25)

## 2018-02-07 LAB — CARDIOLIPIN ANTIBODIES, IGG, IGM, IGA
ANTICARDIOLIPIN IGM: 14 [MPL'U]/mL — AB (ref 0–12)
Anticardiolipin IgA: <9 APL U/mL (ref 0–11)

## 2018-02-10 LAB — PROTHROMBIN GENE MUTATION

## 2018-02-10 LAB — FACTOR 5 LEIDEN

## 2018-02-11 NOTE — Progress Notes (Signed)
[name]   [phone] [phone]   Clinic Follow up Note   Patient Care Team: Seward Carol, MD as PCP - General (Internal Medicine) Josue Hector, MD as PCP - Cardiology (Cardiology) Thelma Comp, Pratt as Consulting Physician (Optometry) Stark Klein, MD as Consulting Physician (General Surgery) Truitt Merle, MD as Consulting Physician (Hematology) Madelon Lips, MD as Consulting Physician (Nephrology)  Date of Service:  02/12/2018  CHIEF COMPLAINT: Follow up of right breast cancer and anemia of chronic disease   SUMMARY OF ONCOLOGIC HISTORY: Oncology History   Cancer Staging Breast cancer of lower-inner quadrant of right female breast [facility]) Staging form: Breast, AJCC 7th Edition - Clinical: Stage IA (T1b, N0, cM0) - Unsigned Staging comments: Staged at breast conference 03/25/13.  - Pathologic stage from 05/12/2013: Stage IA (T1b, N0, cM0) - Signed by Truitt Merle, MD on 06/21/2016       Breast cancer of lower-inner quadrant of right female breast ([place])   02/27/2013 Mammogram    Diagnostic mammogram and ultrasound showed a 6 x 5 mm mass at 5:00 position. Axilla was negative by ultrasound.    03/16/2013 Initial Biopsy    Right breast 5:00 position biopsy showed invasive ductal carcinoma, low-grade    03/16/2013 Receptors her2    ER100% positive, PR 100% positive, HER-2 negative, Ki-67 20%    03/18/2013 Initial Diagnosis    Breast cancer of lower-inner quadrant of right female breast ([place])    05/12/2013 Surgery    Right breast lumpectomy with sentinel lymph node biopsy    05/12/2013 Pathology Results    Right breast lumpectomy showed invasive ductal carcinoma, grade 2, 0.9 cm, 6 sentinel lymph nodes were all negative. Margins were negative.    05/12/2013 Oncotype testing    Oncotype recurrence score 19, intermediate risk, 10 year risk of distant recurrence 12% with tamoxifen for 5 years. Patient declined adjuvant chemotherapy.    06/02/2013 - 07/16/2013 Radiation Therapy    Adjuvant breast radiation with boost 1600Gy    08/06/2013 - 07/2017 Anti-estrogen oral therapy    Arimidex 1 mg daily. Stopped in 07/2017 due to acute illness     03/22/2017 Mammogram    IMPRESSION: No mammographic evidence for malignancy.      CURRENT THERAPY:  Surveillance   INTERVAL HISTORY:  [name] is here for a follow up of her right breast cancer. She presents to the clinic today alone. She notes she doing well except for the recent passing of her granddaughter. She notes no change in medication. She is notes residual right breast tightness but this is manageable. She has been having back pain and received a injection in her back last week. She also notes pain in her right shoulder occasionally. This has been tight for her in movement. She will follow up with her orthopedist about this. She sees her PCP Dr. Delfina Redwood every 3 months. She also follows up with her cardiologist, last visit was last month. She knows her HbA1c is high and her PCP is managing this.     REVIEW OF SYSTEMS:   Constitutional: Denies fevers, chills or abnormal weight loss Eyes: Denies blurriness of vision Ears, nose, mouth, throat, and face: Denies mucositis or sore throat Respiratory: Denies cough, dyspnea or wheezes Cardiovascular: Denies palpitation, chest discomfort or lower extremity swelling MSK: (+) Stable Back pain (+) right shoulder tightness and pain.  Gastrointestinal:  Denies nausea, heartburn or change in bowel habits Skin: Denies abnormal skin rashes Lymphatics: Denies new lymphadenopathy or  easy bruising Neurological:Denies numbness, tingling or new weaknesses Behavioral/Psych: Mood is stable, no new changes  BREAST: (+) Residual tightness in right breast  All other systems were reviewed with the patient and are negative.  MEDICAL HISTORY:  Past Medical History:  Diagnosis Date  . Abdominal discomfort    Chronic N/V/D. Presumptive dx Crohn's  dx per elevated p ANCA. Failed Entocort and Pentasa. Sep 2003 - ileocolectomy c anastomosis per Dr Deon Pilling 2/2 adhesions - path was hegative for Crohns. EGD, Sm bowel follow through (11/03), and an eteroclysis (10/03) were unrevealing. Cuases hypomag and hypocalcemia.  . Adnexal mass 8/03   s/p lap BSO (R ovarian fibroma) & lysis of adhesions  . Allergy    Seasonal  . Anemia    Multifactorial. Baseline HgB 10-11 ish. B12 def - 150 in 3/10. Fe Def - ferritin 35 3/10. Both are being repleted.  . Breast cancer ([place]) 03/16/13   right, 5 o'clock  . CAD (coronary artery disease) 1996   1996 - PTCA and angioplasty diagonal branch. 2000 - Rotoblator & angiopllasty of diagonal. 2006 - subendocardial AMI, DES to proximal LAD.Marland Kitchen Also had 90% stenosis in distal apical LAD. EF 55 with apical hypokinesis. Indefinite ASA and Plavix.  . CHF (congestive heart failure) ([place])   . Chronic kidney disease    Chronic renal insuff baseline Cr 1.2 - 1.4 ish.  . Chronic pain    CT 10/10 = Spinal stenosis L2 - S1.  . Diabetes mellitus    Insulin dependent  . Hx of radiation therapy 06/02/13- 07/16/13   right rbeast 4500 cGy 25 sessions, right breast boost 1600 cGy in 8 sessions  . Hyperlipidemia    Managed with both a statin and Welchol. Welchol stopped 2014 2/2 cost and started on fenofibrate   . Hypertension    2006 B renal arteries patent. 2003 MRA - no RAS. 2003 pheo W/U Dr Hassell Done reportedly negative.  . Hypoxia 07/23/2017  . Lupus ([place])   . Personal history of radiation therapy   . RBBB   . SBO (small bowel obstruction) ([place]) 09/17/2017  . Stroke [facility])    Incidental finding MRI 2002 L lacunar infarct  . Wears dentures    top    SURGICAL HISTORY: Past Surgical History:  Procedure Laterality Date  . ABDOMINAL HYSTERECTOMY    . BILATERAL SALPINGOOPHORECTOMY  8/03   Lap BSO (R ovarian fibroma) and adhesion lysis  . BOWEL RESECTION  2003   ileocolectomy with anastomosis 2/2 adhesions  . BREAST BIOPSY    .  BREAST LUMPECTOMY Right   . BREAST LUMPECTOMY WITH NEEDLE LOCALIZATION AND AXILLARY SENTINEL LYMPH NODE BX Right 04/22/2013   Procedure: BREAST LUMPECTOMY WITH NEEDLE LOCALIZATION AND AXILLARY SENTINEL LYMPH NODE BX;  Surgeon: Stark Klein, MD;  Location: [facility];  Service: General;  Laterality: Right;  . CARDIAC CATHETERIZATION     2 stents  . CHOLECYSTECTOMY    . HEMICOLECTOMY     R sided hemicolectomy  . HERNIA REPAIR     Ventral hernia repair  . PTCA  4/06    I have reviewed the social history and family history with the patient and they are unchanged from previous note.  ALLERGIES:  is allergic to percocet [oxycodone-acetaminophen]; diazepam; haloperidol lactate; lorazepam; morphine sulfate; propoxyphene hcl; tramadol hcl; and anesthetics, halogenated.  MEDICATIONS:  Current Outpatient Medications  Medication Sig Dispense Refill  . acetaminophen (TYLENOL) 325 MG tablet Take 650 mg by mouth every 6 (six) hours as needed for  mild pain.    Marland Kitchen amLODipine (NORVASC) 10 MG tablet Take 1 tablet (10 mg total) by mouth daily. 30 tablet 0  . atorvastatin (LIPITOR) 40 MG tablet Take 1 tablet (40 mg total) by mouth at bedtime. 90 tablet 3  . carvedilol (COREG) 25 MG tablet Take 25 mg by mouth as directed.    . desloratadine (CLARINEX) 5 MG tablet Take 5 mg by mouth at bedtime as needed (allergies). Reported on 04/11/2015    . fenofibrate 54 MG tablet Take 54 mg by mouth daily.     . ferrous fumarate (HEMOCYTE - 106 MG FE) 325 (106 Fe) MG TABS tablet Take 1 tablet by mouth as directed.    . furosemide (LASIX) 40 MG tablet Take 40 mg by mouth as directed.    . gabapentin (NEURONTIN) 100 MG capsule Take 1 capsule (100 mg total) by mouth 4 (four) times daily. 120 capsule 3  . hydrALAZINE (APRESOLINE) 10 MG tablet Take 10 mg by mouth as directed.    . linagliptin (TRADJENTA) 5 MG TABS tablet Take 5 mg by mouth daily.    . methocarbamol (ROBAXIN) 500 MG tablet Take 1 tablet (500 mg  total) by mouth every 8 (eight) hours as needed for muscle spasms. 60 tablet 1  . nitroGLYCERIN (NITROSTAT) 0.4 MG SL tablet Place 1 tablet (0.4 mg total) under the tongue every 5 (five) minutes as needed for chest pain. 25 tablet 3  . pantoprazole (PROTONIX) 40 MG tablet Take 1 tablet (40 mg total) by mouth daily. 30 tablet 3  . potassium chloride SA (K-DUR,KLOR-CON) 20 MEQ tablet Take 1 tablet (20 mEq total) by mouth daily as needed (when taking lasix). 30 tablet 1  . ULORIC 40 MG tablet Take 1 tablet by mouth daily.     No current facility-administered medications for this visit.    Facility-Administered Medications Ordered in Other Visits  Medication Dose Route Frequency Provider Last Rate Last Dose  . 0.9 %  sodium chloride infusion   Intravenous Continuous Burgess Estelle, MD 150 mL/hr at 10/03/15 1607      PHYSICAL EXAMINATION: ECOG PERFORMANCE STATUS: 1 - Symptomatic but completely ambulatory  Vitals:   02/12/18 0844  BP: (!) 152/68  Pulse: 80  Resp: 18  Temp: 98.6 F (37 C)  SpO2: 100%   Filed Weights   02/12/18 0844  Weight: 178 lb 1.6 oz (80.8 kg)    GENERAL:alert, no distress and comfortable SKIN: skin color, texture, turgor are normal, no rashes or significant lesions EYES: normal, Conjunctiva are pink and non-injected, sclera clear OROPHARYNX:no exudate, no erythema and lips, buccal mucosa, and tongue normal  NECK: supple, thyroid normal size, non-tender, without nodularity LYMPH:  no palpable lymphadenopathy in the cervical, axillary or inguinal LUNGS: clear to auscultation and percussion with normal breathing effort HEART: regular rate & rhythm and no murmurs and no lower extremity edema ABDOMEN:abdomen soft, non-tender and normal bowel sounds Musculoskeletal:no cyanosis of digits and no clubbing  NEURO: alert & oriented x 3 with fluent speech, no focal motor/sensory deficits BREAST: S/p Right breast lumpectomy: Right breast skin hyperpigmentation and hardness  than normal with scar tissue in inferior of right breast. No other palpable mass.    LABORATORY DATA:  I have reviewed the data as listed CBC Latest Ref Rng & Units 02/05/2018 10/25/2017 10/16/2017  WBC 4.0 - 10.5 K/uL 16.4(H) 9.3 7.4  Hemoglobin 12.0 - 15.0 g/dL 8.9(L) 10.2(L) 9.3(L)  Hematocrit 36.0 - 46.0 % 28.8(L) 30.8(L) 29.0(L)  Platelets 150 -  400 K/uL 342 411.0(H) 345     CMP Latest Ref Rng & Units 02/05/2018 10/25/2017 10/16/2017  Glucose 70 - 99 mg/dL 214(H) 182(H) 230(H)  BUN 8 - 23 mg/dL 65(H) 52(H) 39(H)  Creatinine 0.44 - 1.00 mg/dL 2.12(H) 2.20(H) 1.95(H)  Sodium 135 - 145 mmol/L 138 140 139  Potassium 3.5 - 5.1 mmol/L 4.1 4.7 4.0  Chloride 98 - 111 mmol/L 105 103 103  CO2 22 - 32 mmol/L 21(L) 30 28  Calcium 8.9 - 10.3 mg/dL 9.5 10.3 9.9  Total Protein 6.5 - 8.1 g/dL 8.8(H) - 8.0  Total Bilirubin 0.3 - 1.2 mg/dL 0.2(L) - 0.2(L)  Alkaline Phos 38 - 126 U/L 108 - 44  AST 15 - 41 U/L 19 - 11(L)  ALT 0 - 44 U/L 18 - 9      RADIOGRAPHIC STUDIES: I have personally reviewed the radiological images as listed and agreed with the findings in the report. No results found.   ASSESSMENT & PLAN:  [name] is a 72 y.o. female with   1.  Breast cancer of the lower inner quadrant of the right breast, invasive ductal carcinoma with DCIS, pT1bN0Mo, stage IA,   ER+ and PR +, HER 2 negative -She was diagnosed in 02/2013 and treated with right lumpectomy, adjuvant radiation and Anastrozole for 4 years until 07/2017.  Anastrozole was stopped after she developed septic shock from pneumonia and a cardiac arrest, and we decided not restarted due to the risk of cardiac toxicity. -She is clinically doing well. Lab reviewed, her Hg and Kidney function worsened. Her WBC and glucose was elevated. Her physical exam and her 03/2017 mammogram were unremarkable. There is no clinical concern for recurrence. -She is 5 years out since diagnosis. Will proceed with screening mammogram in 03/2018.    -Continue breast cancer surveillance, f/u in 6 months and continue to follow up with Dr. Delfina Redwood and Dr. Hollie Salk with labs in interim.    2. Anemia of chronic disease (CKD), Iron deficiency  -Patient receives a B12 shot every month at PCP's office -I will consider iv iron and Aranesp injections if her iron level does not improve. -Latest Hg at 8.9 (02/05/18), no need for blood transfusion. I discussed starting EPO in anemia from CKD if her Hb <8.  -I discussed this decrease is likely related to her worsening CKD.  -Continue OTC iron pill ferrous sulfate 1-2 tab daily for iron deficiency -f/u in 6 months    3. Low Back, from degenerative arthritis and spinal stenosis - f/u with Orthopedics  -She is currently getting injections with orthopedist   4. DM  -managed by PCP -Currently on Lantis, tradjentra -Latest Glucose at 214 (02/12/18)   5. Chronic renal insufficieny, CKD stage IV -She follows up with Dr. Hollie Salk for her kidney function.  -She knows to avoid nephrotoxins -Latest Cr at 2.12, BUN 65, GFR at 26. Based on these results I discussed she is stage IV CKD.  -Her BP and latest glucose has been evaluated. I strongly encouraged her to control this as this will negatively impact her kidney function.   6. CAD, HTN -Continue to follow up with Dr. Johnsie Cancel  -BP at 152/68 today (02/12/18)  7. Genetics, results negative   8. Gage was in 02/2016 with T score of 1.0, normal  -She will continue vitamin D supplement. -Plan for next DEXA in 03/2018  9. Left Renal Vein Thrombosis in 07/2017 -Likely provoked from acute illness and prior SBO -  She stopped Xarelto in 01/2018 -Her hypercoagulopathy work-up in November 2019 was negative, I reviewed with her   10. History of pneumonia and cardiac arrest in 07/2017 -10/16/17 CXR showed residual peribronchial disease, atelectasis, scarring or potentially pulmonary nodules. -Her latest CT chest from 01/27/18 shows No  acute findings. Scarring in the left upper lobe.   PLAN  -lab and recent CT reviewed with pt -Lab and f/u in 6 months  -mammogram and DEXA in Feb 2020, ordered today     No problem-specific Assessment & Plan notes found for this encounter.   Orders Placed This Encounter  Procedures  . MM Digital Screening    Standing Status:   Future    Standing Expiration Date:   02/12/2019    Order Specific Question:   Reason for Exam (SYMPTOM  OR DIAGNOSIS REQUIRED)    Answer:   screening    Order Specific Question:   Preferred imaging location?    Answer:   [facility]  . DG Bone Density    Standing Status:   Future    Standing Expiration Date:   02/12/2019    Order Specific Question:   Reason for Exam (SYMPTOM  OR DIAGNOSIS REQUIRED)    Answer:   screening    Order Specific Question:   Preferred imaging location?    Answer:   [facility]   All questions were answered. The patient knows to call the clinic with any problems, questions or concerns. No barriers to learning was detected. I spent 20 minutes counseling the patient face to face. The total time spent in the appointment was 25 minutes and more than 50% was on counseling and review of test results     Truitt Merle, MD 02/12/2018   I, Joslyn Devon, am acting as scribe for Truitt Merle, MD.   I have reviewed the above documentation for accuracy and completeness, and I agree with the above.

## 2018-02-12 ENCOUNTER — Inpatient Hospital Stay (HOSPITAL_BASED_OUTPATIENT_CLINIC_OR_DEPARTMENT_OTHER): Payer: Medicare Other | Admitting: Hematology

## 2018-02-12 ENCOUNTER — Telehealth: Payer: Self-pay

## 2018-02-12 VITALS — BP 152/68 | HR 80 | Temp 98.6°F | Resp 18 | Ht 64.0 in | Wt 178.1 lb

## 2018-02-12 DIAGNOSIS — C50311 Malignant neoplasm of lower-inner quadrant of right female breast: Secondary | ICD-10-CM

## 2018-02-12 DIAGNOSIS — D631 Anemia in chronic kidney disease: Secondary | ICD-10-CM

## 2018-02-12 DIAGNOSIS — M1990 Unspecified osteoarthritis, unspecified site: Secondary | ICD-10-CM

## 2018-02-12 DIAGNOSIS — I2510 Atherosclerotic heart disease of native coronary artery without angina pectoris: Secondary | ICD-10-CM

## 2018-02-12 DIAGNOSIS — M4800 Spinal stenosis, site unspecified: Secondary | ICD-10-CM

## 2018-02-12 DIAGNOSIS — Z8674 Personal history of sudden cardiac arrest: Secondary | ICD-10-CM

## 2018-02-12 DIAGNOSIS — Z170 Estrogen receptor positive status [ER+]: Principal | ICD-10-CM

## 2018-02-12 DIAGNOSIS — Z8701 Personal history of pneumonia (recurrent): Secondary | ICD-10-CM

## 2018-02-12 DIAGNOSIS — E1122 Type 2 diabetes mellitus with diabetic chronic kidney disease: Secondary | ICD-10-CM

## 2018-02-12 DIAGNOSIS — Z853 Personal history of malignant neoplasm of breast: Secondary | ICD-10-CM | POA: Diagnosis not present

## 2018-02-12 DIAGNOSIS — E2839 Other primary ovarian failure: Secondary | ICD-10-CM

## 2018-02-12 DIAGNOSIS — D509 Iron deficiency anemia, unspecified: Secondary | ICD-10-CM

## 2018-02-12 DIAGNOSIS — I823 Embolism and thrombosis of renal vein: Secondary | ICD-10-CM

## 2018-02-12 DIAGNOSIS — N184 Chronic kidney disease, stage 4 (severe): Secondary | ICD-10-CM

## 2018-02-12 DIAGNOSIS — I1310 Hypertensive heart and chronic kidney disease without heart failure, with stage 1 through stage 4 chronic kidney disease, or unspecified chronic kidney disease: Secondary | ICD-10-CM

## 2018-02-12 NOTE — Telephone Encounter (Signed)
Printed avs and calender of upcoming appointment. Per 11/27 los

## 2018-02-13 ENCOUNTER — Encounter: Payer: Self-pay | Admitting: Hematology

## 2018-02-20 ENCOUNTER — Ambulatory Visit (INDEPENDENT_AMBULATORY_CARE_PROVIDER_SITE_OTHER): Payer: Medicare Other | Admitting: Internal Medicine

## 2018-02-20 ENCOUNTER — Encounter: Payer: Self-pay | Admitting: Internal Medicine

## 2018-02-20 VITALS — BP 178/78 | HR 89 | Ht 63.0 in | Wt 185.2 lb

## 2018-02-20 DIAGNOSIS — Z8674 Personal history of sudden cardiac arrest: Secondary | ICD-10-CM | POA: Diagnosis not present

## 2018-02-20 DIAGNOSIS — R042 Hemoptysis: Secondary | ICD-10-CM | POA: Diagnosis not present

## 2018-02-20 DIAGNOSIS — R0489 Hemorrhage from other sites in respiratory passages: Secondary | ICD-10-CM

## 2018-02-20 DIAGNOSIS — J189 Pneumonia, unspecified organism: Secondary | ICD-10-CM

## 2018-02-20 DIAGNOSIS — Z8709 Personal history of other diseases of the respiratory system: Secondary | ICD-10-CM

## 2018-02-20 DIAGNOSIS — N189 Chronic kidney disease, unspecified: Secondary | ICD-10-CM

## 2018-02-20 DIAGNOSIS — D649 Anemia, unspecified: Secondary | ICD-10-CM

## 2018-02-20 NOTE — Progress Notes (Signed)
OV 10/25/2017  Chief Complaint  Patient presents with  . Hospitalization Follow-up    Pt was in the hospital back in May 72 for PNA. She saw MR in the hospital and wanted to follow up with him in office. Per patient, she has been feeling ok.     Evann Carmie Kanner  Presents with daughter who is APP at [facility]. I briefly new patient in May 2019 when she was hospitalized post cardiac arrest. She was admitted for left sided pneumonia and then in the aftermath of the cardiac arrest had ARDS and also alveolar hemorrhage. Autoimmuneand vasculitis panel was negative. It appears she survive the hospitalization and she presents for follow-up today. According to the daughter and the patient she has no recollection of the admission but she has good memory preadmission and post admission. She spent some time in inpatient rehabilitation and had home physical therapy. She reports significant improvement in physical conditioning although she does still have some shortness of breath with exertion especially for stairs and relieved by rest. According to the daughter pulse oximetry checked during physical therapy and rehabilitation was normal. This no cough or wheezing. Her fatigue is significantly improved. She is doing all her activities of daily living. 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  Review of recent CT imaging August 2019 personally visualized shows left upper lobe density very likely resolving infiltrates from the alveolar hemorrhage but this requires follow-up. Recent labs indicate persistent anemia and worsening creatinine from just over a week ago.   Results for KIEARA, SCHWARK (MRN [id number]) as of 10/25/2017 11:59  Ref. Range 09/18/2017 07:10 10/16/2017 10:52  Creatinine Latest Ref Range: 0.44 - 1.00 mg/dL 1.12 (H) 1.95 (H)  Results for JARISSA, SHERIFF (MRN [id number]) as of 10/25/2017 11:59  Ref. Range  09/18/2017 07:10 10/16/2017 10:52  Hemoglobin Latest Ref Range: 11.6 - 15.9 g/dL 9.8 (L) 9.3 (L)      OV 02/20/2018  Subjective:  Patient ID: XITLALLY MOONEYHAM, female , DOB: April 21, 1945 , age 72 y.o. , MRN: [id number] , ADDRESS: [place] [facility] [id number]   02/20/2018 -   Chief Complaint  Patient presents with  . Follow-up    f/u pneumonia, feeling better, CT results     HPI Cambria J Shipp 72 y.o. -ARDS survivor presents for follow-up.  Since her last visit her physical conditioning is improved.  She is more ambulatory.  She is independent activities of daily living.  She has not had any further recurrence of her ARDS or pulmonary alveolar hemorrhage.  She had CT scan of the chest November 2019 that I personally visualized.  She only has residual scar in her left upper lobe with associated mild traction bronchiectasis.  From a respiratory standpoint she does not have any symptoms but she continues have some fatigue.  Most recently she saw her hematologist for anemia and it is worse as documented below.  Also her kidney disease has gotten worse and she is now on chronic CKD 4.  I discussed this with her and her daughters especially [name] who is a Designer, jewellery.  They are concerned about the anemia and the chronic kidney disease getting worse.  Review of the chart indicates in 2010 she had a colonoscopy with Dr. Carlean Purl but since then has not followed up with him.  The pathology from that was benign.  They are willing to see him again.  The anemia is gotten worse despite continued iron tablets.  Review of the notes indicate the hematologist thinks is because of anemia of chronic kidney disease.  Anyway she is due for screening colonoscopy because it has been 10 years.  They are open to having an expectant follow-up with pulmonary because of absence of respiratory symptoms and a resolved CT chest.   Results for CHRISTIONA, SIDDIQUE (MRN [id number]) as of 02/20/2018 15:01  Ref. Range 09/18/2017 07:51  09/18/2017 12:25 10/16/2017 10:52 10/16/2017 10:53 10/16/2017 12:50 10/24/2017 12:14 10/25/2017 12:33 12/21/2017 15:19 01/27/2018 09:10 02/05/2018 07:53 02/05/2018 08:00  Creatinine Latest Ref Range: 0.44 - 1.00 mg/dL   1.95 (H)    2.20 (H)   2.12 (H)   Results for BRENYA, TAULBEE (MRN [id number]) as of 02/20/2018 15:01  Ref. Range 09/18/2017 07:51 09/18/2017 12:25 10/16/2017 10:52 10/16/2017 10:53 10/16/2017 12:50 10/24/2017 12:14 10/25/2017 12:33 12/21/2017 15:19 01/27/2018 09:10 02/05/2018 07:53 02/05/2018 08:00  Hemoglobin Latest Ref Range: 12.0 - 15.0 g/dL   9.3 (L)    10.2 (L)   8.9 (L)     ROS - per HPI     has a past medical history of Abdominal discomfort, Adnexal mass (8/03), Allergy, Anemia, Breast cancer ([place]) (03/16/13), CAD (coronary artery disease) (1996), CHF (congestive heart failure) ([place]), Chronic kidney disease, Chronic pain, Diabetes mellitus, radiation therapy (06/02/13- 07/16/13), Hyperlipidemia, Hypertension, Hypoxia (07/23/2017), Lupus ([place]), Personal history of radiation therapy, RBBB, SBO (small bowel obstruction) ([place]) (09/17/2017), Stroke ([place]), and Wears dentures.   reports that she has never smoked. She has never used smokeless tobacco.  Past Surgical History:  Procedure Laterality Date  . ABDOMINAL HYSTERECTOMY    . BILATERAL SALPINGOOPHORECTOMY  8/03   Lap BSO (R ovarian fibroma) and adhesion lysis  . BOWEL RESECTION  2003   ileocolectomy with anastomosis 2/2 adhesions  . BREAST BIOPSY    . BREAST LUMPECTOMY Right   . BREAST LUMPECTOMY WITH NEEDLE LOCALIZATION AND AXILLARY SENTINEL LYMPH NODE BX Right 04/22/2013   Procedure: BREAST LUMPECTOMY WITH NEEDLE LOCALIZATION AND AXILLARY SENTINEL LYMPH NODE BX;  Surgeon: Stark Klein, MD;  Location: [facility];  Service: General;  Laterality: Right;  . CARDIAC CATHETERIZATION     2 stents  . CHOLECYSTECTOMY    . HEMICOLECTOMY     R sided hemicolectomy  . HERNIA REPAIR     Ventral hernia repair  . PTCA  4/06     Allergies  Allergen Reactions  . Percocet [Oxycodone-Acetaminophen] Other (See Comments)    headaches  . Diazepam Other (See Comments)    REACTION: Agitation  . Haloperidol Lactate Nausea And Vomiting  . Lorazepam Nausea Only  . Morphine Sulfate Other (See Comments)    REACTION: Agitation  . Propoxyphene Hcl Nausea And Vomiting  . Tramadol Hcl Nausea And Vomiting  . Anesthetics, Halogenated     Per patient's daughter, she can not tolerate anesthetics.     Immunization History  Administered Date(s) Administered  . Influenza Split 12/13/2010, 12/11/2011  . Influenza Whole 01/06/2009, 02/07/2010  . Influenza,inj,Quad PF,6+ Mos 01/06/2013, 12/17/2013, 01/06/2015, 12/19/2016  . PPD Test 01/02/2017  . Pneumococcal Conjugate-13 10/01/2013  . Pneumococcal Polysaccharide-23 06/02/2009, 10/28/2014  . Td 04/28/2009    Family History  Problem Relation Age of Onset  . Pancreatic cancer Brother 14  . Cancer Mother   . Breast cancer Mother 25  . Lung cancer Maternal Aunt   . Breast cancer Maternal Aunt   . Prostate  cancer Maternal Uncle   . Heart attack Maternal Grandmother   . Breast cancer Maternal Grandmother   . Kidney failure Maternal Aunt   . Hypertension Daughter      Current Outpatient Medications:  .  acetaminophen (TYLENOL) 325 MG tablet, Take 650 mg by mouth every 6 (six) hours as needed for mild pain., Disp: , Rfl:  .  amLODipine (NORVASC) 10 MG tablet, Take 1 tablet (10 mg total) by mouth daily., Disp: 30 tablet, Rfl: 0 .  atorvastatin (LIPITOR) 40 MG tablet, Take 1 tablet (40 mg total) by mouth at bedtime., Disp: 90 tablet, Rfl: 3 .  carvedilol (COREG) 25 MG tablet, Take 25 mg by mouth as directed., Disp: , Rfl:  .  desloratadine (CLARINEX) 5 MG tablet, Take 5 mg by mouth at bedtime as needed (allergies). Reported on 04/11/2015, Disp: , Rfl:  .  fenofibrate 54 MG tablet, Take 54 mg by mouth daily. , Disp: , Rfl:  .  ferrous fumarate (HEMOCYTE - 106 MG FE) 325 (106  Fe) MG TABS tablet, Take 1 tablet by mouth as directed., Disp: , Rfl:  .  furosemide (LASIX) 40 MG tablet, Take 40 mg by mouth as directed., Disp: , Rfl:  .  gabapentin (NEURONTIN) 100 MG capsule, Take 1 capsule (100 mg total) by mouth 4 (four) times daily., Disp: 120 capsule, Rfl: 3 .  hydrALAZINE (APRESOLINE) 10 MG tablet, Take 10 mg by mouth as directed., Disp: , Rfl:  .  linagliptin (TRADJENTA) 5 MG TABS tablet, Take 5 mg by mouth daily., Disp: , Rfl:  .  methocarbamol (ROBAXIN) 500 MG tablet, Take 1 tablet (500 mg total) by mouth every 8 (eight) hours as needed for muscle spasms., Disp: 60 tablet, Rfl: 1 .  nitroGLYCERIN (NITROSTAT) 0.4 MG SL tablet, Place 1 tablet (0.4 mg total) under the tongue every 5 (five) minutes as needed for chest pain., Disp: 25 tablet, Rfl: 3 .  pantoprazole (PROTONIX) 40 MG tablet, Take 1 tablet (40 mg total) by mouth daily., Disp: 30 tablet, Rfl: 3 .  potassium chloride SA (K-DUR,KLOR-CON) 20 MEQ tablet, Take 1 tablet (20 mEq total) by mouth daily as needed (when taking lasix)., Disp: 30 tablet, Rfl: 1 .  ULORIC 40 MG tablet, Take 1 tablet by mouth daily., Disp: , Rfl:  No current facility-administered medications for this visit.   Facility-Administered Medications Ordered in Other Visits:  .  0.9 %  sodium chloride infusion, , Intravenous, Continuous, Burgess Estelle, MD, Last Rate: 150 mL/hr at 10/03/15 1607      Objective:   Vitals:   02/20/18 1449  BP: (!) 178/78  Pulse: 89  SpO2: 96%  Weight: 185 lb 3.2 oz (84 kg)  Height: 5' 3"  (1.6 m)    Estimated body mass index is 32.81 kg/m as calculated from the following:   Height as of this encounter: 5' 3"  (1.6 m).   Weight as of this encounter: 185 lb 3.2 oz (84 kg).  @WEIGHTCHANGE @  [employer]   02/20/18 1449  Weight: 185 lb 3.2 oz (84 kg)     Physical Exam  General Appearance:    Alert, cooperative, no distress, appears stated age - yes , Deconditioned looking - no , OBESE  - yes, Sitting  on Wheelchair -  no  Head:    Normocephalic, without obvious abnormality, atraumatic  Eyes:    PERRL, conjunctiva/corneas clear,  Ears:    Normal TM's and external ear canals, both ears  Nose:   Nares normal,  septum midline, mucosa normal, no drainage    or sinus tenderness. OXYGEN ON  - no . Patient is @ ra   Throat:   Lips, mucosa, and tongue normal; teeth and gums normal. Cyanosis on lips - no  Neck:   Supple, symmetrical, trachea midline, no adenopathy;    thyroid:  no enlargement/tenderness/nodules; no carotid   bruit or JVD  Back:     Symmetric, no curvature, ROM normal, no CVA tenderness  Lungs:     Distress - no , Wheeze no, Barrell Chest - no, Purse lip breathing - no, Crackles - no   Chest Wall:    No tenderness or deformity.    Heart:    Regular rate and rhythm, S1 and S2 normal, no rub   or gallop, Murmur - no  Breast Exam:    NOT DONE  Abdomen:     Soft, non-tender, bowel sounds active all four quadrants,    no masses, no organomegaly. Visceral obesity - yes  Genitalia:   NOT DONE  Rectal:   NOT DONE  Extremities:   Extremities - normal, Has Cane - no, Clubbing - no, Edema - no  Pulses:   2+ and symmetric all extremities  Skin:   Stigmata of Connective Tissue Disease - no  Lymph nodes:   Cervical, supraclavicular, and axillary nodes normal  Psychiatric:  Neurologic:   Pleasant - yes, Anxious - no, Flat affect - no  CAm-ICU - neg, Alert and Oriented x 3 - yes, Moves all 4s - yes, Speech - normal, Cognition - intact           Assessment:       ICD-10-CM   1. Pneumonia of left lung due to infectious organism, unspecified part of lung J18.9   2. Diffuse pulmonary alveolar hemorrhage R04.2   3. ARDS survivor Z87.09   4. H/O cardiac arrest Z86.74   5. Chronic kidney disease, unspecified CKD stage N18.9   6. Anemia, unspecified type D64.9 Ambulatory referral to Gastroenterology       Plan:     Patient Instructions  Pneumonia of left lung due to infectious  organism, unspecified part of lung Diffuse pulmonary alveolar hemorrhage ARDS survivor H/O cardiac arrest  - CT scan Nov 2019 stable with some residual scar Left upper lobe - no symptoms  - expectant followup  Chronic kidney disease, unspecified CKD stage - glad you are going to see Dr Hollie Salk  Anemia, unspecified type - worse -given fact last colonoscopy was nearly 10 years ago- re-refer [facility]  Followup As needed   > 50% of this > 25 min visit spent in face to face counseling or coordination of care - by this undersigned MD - Dr Brand Males. This includes one or more of the following documented above: discussion of test results, diagnostic or treatment recommendations, prognosis, risks and benefits of management options, instructions, education, compliance or risk-factor reduction    SIGNATURE    Dr. Brand Males, M.D., F.C.C.P,  Pulmonary and Critical Care Medicine Staff Physician, [place] Director - Interstitial Lung Disease  Program  Pulmonary [facility] [facility] [place], [place], [address]  Pager: [phone], If no answer or between  15:00h - 7:00h: call [phone] Telephone: [phone]  3:21 PM 02/20/2018

## 2018-02-20 NOTE — Patient Instructions (Signed)
Pneumonia of left lung due to infectious organism, unspecified part of lung Diffuse pulmonary alveolar hemorrhage ARDS survivor H/O cardiac arrest  - CT scan Nov 2019 stable with some residual scar Left upper lobe - no symptoms  - expectant followup  Chronic kidney disease, unspecified CKD stage - glad you are going to see Dr Hollie Salk  Anemia, unspecified type - worse -given fact last colonoscopy was nearly 10 years ago- re-refer [facility]  Followup As needed

## 2018-02-25 ENCOUNTER — Telehealth (HOSPITAL_COMMUNITY): Payer: Self-pay

## 2018-02-25 NOTE — Telephone Encounter (Signed)
Detailed message left for her test. Asked to call back with any questions.

## 2018-02-26 ENCOUNTER — Other Ambulatory Visit: Payer: Self-pay | Admitting: Nephrology

## 2018-02-26 ENCOUNTER — Encounter: Payer: Self-pay | Admitting: Internal Medicine

## 2018-02-26 DIAGNOSIS — N2581 Secondary hyperparathyroidism of renal origin: Secondary | ICD-10-CM

## 2018-02-26 DIAGNOSIS — N1831 Chronic kidney disease, stage 3a: Secondary | ICD-10-CM

## 2018-02-26 DIAGNOSIS — N189 Chronic kidney disease, unspecified: Secondary | ICD-10-CM

## 2018-02-26 DIAGNOSIS — I823 Embolism and thrombosis of renal vein: Secondary | ICD-10-CM

## 2018-02-26 DIAGNOSIS — N183 Chronic kidney disease, stage 3 (moderate): Principal | ICD-10-CM

## 2018-02-26 DIAGNOSIS — D631 Anemia in chronic kidney disease: Secondary | ICD-10-CM

## 2018-02-27 ENCOUNTER — Ambulatory Visit (HOSPITAL_COMMUNITY): Payer: Medicare Other | Attending: Cardiovascular Disease

## 2018-02-27 ENCOUNTER — Other Ambulatory Visit: Payer: Self-pay | Admitting: Physical Medicine & Rehabilitation

## 2018-02-27 DIAGNOSIS — R079 Chest pain, unspecified: Secondary | ICD-10-CM

## 2018-02-27 DIAGNOSIS — K219 Gastro-esophageal reflux disease without esophagitis: Secondary | ICD-10-CM

## 2018-02-27 DIAGNOSIS — I2510 Atherosclerotic heart disease of native coronary artery without angina pectoris: Secondary | ICD-10-CM | POA: Diagnosis not present

## 2018-02-27 MED ORDER — REGADENOSON 0.4 MG/5ML IV SOLN
0.4000 mg | Freq: Once | INTRAVENOUS | Status: AC
  Administered 2018-02-27: 0.4 mg via INTRAVENOUS

## 2018-02-27 MED ORDER — TECHNETIUM TC 99M TETROFOSMIN IV KIT
31.3000 | PACK | Freq: Once | INTRAVENOUS | Status: AC | PRN
  Administered 2018-02-27: 31.3 via INTRAVENOUS
  Filled 2018-02-27: qty 32

## 2018-02-27 MED ORDER — TECHNETIUM TC 99M TETROFOSMIN IV KIT
9.4000 | PACK | Freq: Once | INTRAVENOUS | Status: AC | PRN
  Administered 2018-02-27: 9.4 via INTRAVENOUS
  Filled 2018-02-27: qty 10

## 2018-02-28 ENCOUNTER — Ambulatory Visit (HOSPITAL_BASED_OUTPATIENT_CLINIC_OR_DEPARTMENT_OTHER): Payer: Medicare Other | Admitting: Physical Medicine & Rehabilitation

## 2018-02-28 ENCOUNTER — Encounter: Payer: Medicare Other | Attending: Physical Medicine & Rehabilitation

## 2018-02-28 ENCOUNTER — Encounter: Payer: Self-pay | Admitting: Physical Medicine & Rehabilitation

## 2018-02-28 VITALS — BP 143/78 | HR 92 | Resp 14 | Ht 64.0 in | Wt 180.0 lb

## 2018-02-28 DIAGNOSIS — Z9049 Acquired absence of other specified parts of digestive tract: Secondary | ICD-10-CM | POA: Diagnosis not present

## 2018-02-28 DIAGNOSIS — G8929 Other chronic pain: Secondary | ICD-10-CM | POA: Diagnosis not present

## 2018-02-28 DIAGNOSIS — E1122 Type 2 diabetes mellitus with diabetic chronic kidney disease: Secondary | ICD-10-CM | POA: Diagnosis not present

## 2018-02-28 DIAGNOSIS — I130 Hypertensive heart and chronic kidney disease with heart failure and stage 1 through stage 4 chronic kidney disease, or unspecified chronic kidney disease: Secondary | ICD-10-CM | POA: Diagnosis not present

## 2018-02-28 DIAGNOSIS — G9340 Encephalopathy, unspecified: Secondary | ICD-10-CM | POA: Diagnosis present

## 2018-02-28 DIAGNOSIS — Z9889 Other specified postprocedural states: Secondary | ICD-10-CM | POA: Diagnosis not present

## 2018-02-28 DIAGNOSIS — Z803 Family history of malignant neoplasm of breast: Secondary | ICD-10-CM | POA: Insufficient documentation

## 2018-02-28 DIAGNOSIS — Z8674 Personal history of sudden cardiac arrest: Secondary | ICD-10-CM | POA: Diagnosis not present

## 2018-02-28 DIAGNOSIS — E785 Hyperlipidemia, unspecified: Secondary | ICD-10-CM | POA: Insufficient documentation

## 2018-02-28 DIAGNOSIS — M5416 Radiculopathy, lumbar region: Secondary | ICD-10-CM

## 2018-02-28 DIAGNOSIS — Z841 Family history of disorders of kidney and ureter: Secondary | ICD-10-CM | POA: Insufficient documentation

## 2018-02-28 DIAGNOSIS — I5022 Chronic systolic (congestive) heart failure: Secondary | ICD-10-CM | POA: Insufficient documentation

## 2018-02-28 DIAGNOSIS — I2510 Atherosclerotic heart disease of native coronary artery without angina pectoris: Secondary | ICD-10-CM | POA: Insufficient documentation

## 2018-02-28 DIAGNOSIS — K5090 Crohn's disease, unspecified, without complications: Secondary | ICD-10-CM | POA: Diagnosis not present

## 2018-02-28 DIAGNOSIS — I252 Old myocardial infarction: Secondary | ICD-10-CM | POA: Insufficient documentation

## 2018-02-28 DIAGNOSIS — Z800 Family history of malignant neoplasm of digestive organs: Secondary | ICD-10-CM | POA: Insufficient documentation

## 2018-02-28 DIAGNOSIS — Z8249 Family history of ischemic heart disease and other diseases of the circulatory system: Secondary | ICD-10-CM | POA: Diagnosis not present

## 2018-02-28 DIAGNOSIS — Z801 Family history of malignant neoplasm of trachea, bronchus and lung: Secondary | ICD-10-CM | POA: Diagnosis not present

## 2018-02-28 DIAGNOSIS — Z955 Presence of coronary angioplasty implant and graft: Secondary | ICD-10-CM | POA: Insufficient documentation

## 2018-02-28 DIAGNOSIS — R5381 Other malaise: Secondary | ICD-10-CM | POA: Insufficient documentation

## 2018-02-28 DIAGNOSIS — Z8709 Personal history of other diseases of the respiratory system: Secondary | ICD-10-CM | POA: Insufficient documentation

## 2018-02-28 DIAGNOSIS — Z8673 Personal history of transient ischemic attack (TIA), and cerebral infarction without residual deficits: Secondary | ICD-10-CM | POA: Insufficient documentation

## 2018-02-28 DIAGNOSIS — R490 Dysphonia: Secondary | ICD-10-CM | POA: Insufficient documentation

## 2018-02-28 DIAGNOSIS — M48061 Spinal stenosis, lumbar region without neurogenic claudication: Secondary | ICD-10-CM | POA: Diagnosis not present

## 2018-02-28 DIAGNOSIS — Z8042 Family history of malignant neoplasm of prostate: Secondary | ICD-10-CM | POA: Diagnosis not present

## 2018-02-28 DIAGNOSIS — M329 Systemic lupus erythematosus, unspecified: Secondary | ICD-10-CM | POA: Insufficient documentation

## 2018-02-28 DIAGNOSIS — E11649 Type 2 diabetes mellitus with hypoglycemia without coma: Secondary | ICD-10-CM | POA: Insufficient documentation

## 2018-02-28 DIAGNOSIS — I4510 Unspecified right bundle-branch block: Secondary | ICD-10-CM | POA: Insufficient documentation

## 2018-02-28 DIAGNOSIS — Z90710 Acquired absence of both cervix and uterus: Secondary | ICD-10-CM | POA: Insufficient documentation

## 2018-02-28 DIAGNOSIS — Z853 Personal history of malignant neoplasm of breast: Secondary | ICD-10-CM | POA: Insufficient documentation

## 2018-02-28 DIAGNOSIS — Z794 Long term (current) use of insulin: Secondary | ICD-10-CM | POA: Insufficient documentation

## 2018-02-28 NOTE — Progress Notes (Signed)
Subjective:    Patient ID: [name], female    DOB: April 17, 1945, 72 y.o.   MRN: [id number]  HPI  72 year old female with history of lumbar spinal stenosis who returns to clinic with complaints of right lower extremity pain. The patient is normally seen by Dr. Naaman Plummer who is not in clinic today. Right L2-3 Translaminar Lumbar ESI 02/03/18 , helped Right sided low back pain,  Primary complaint is pain down the Right leg to the foot when she lays down at night.  No weakness in Right foot and ankle area.  No new bowel or bladder problem Patient has had no falls or trauma recently. She remains independent with all her self-care and mobility  Pain Inventory Average Pain 7 Pain Right Now 3 My pain is aching  In the last 24 hours, has pain interfered with the following? General activity 7 Relation with others 7 Enjoyment of life 7 What TIME of day is your pain at its worst? night Sleep (in general) Poor  Pain is worse with: inactivity and some activites Pain improves with: rest Relief from Meds: 8  Mobility walk without assistance do you drive?  yes  Function retired  Neuro/Psych No problems in this area  Prior Studies Any changes since last visit?  no  Physicians involved in your care Any changes since last visit?  no   Family History  Problem Relation Age of Onset  . Pancreatic cancer Brother 66  . Cancer Mother   . Breast cancer Mother 52  . Lung cancer Maternal Aunt   . Breast cancer Maternal Aunt   . Prostate cancer Maternal Uncle   . Heart attack Maternal Grandmother   . Breast cancer Maternal Grandmother   . Kidney failure Maternal Aunt   . Hypertension Daughter    Social History   Socioeconomic History  . Marital status: Legally Separated    Spouse name: Not on file  . Number of children: 4  . Years of education: Not on file  . Highest education level: Not on file  Occupational History    Employer: [employer]  Social Needs  .  Financial resource strain: Not on file  . Food insecurity:    Worry: Not on file    Inability: Not on file  . Transportation needs:    Medical: Not on file    Non-medical: Not on file  Tobacco Use  . Smoking status: Never Smoker  . Smokeless tobacco: Never Used  Substance and Sexual Activity  . Alcohol use: No    Alcohol/week: 0.0 standard drinks  . Drug use: No  . Sexual activity: Not on file  Lifestyle  . Physical activity:    Days per week: Not on file    Minutes per session: Not on file  . Stress: Not on file  Relationships  . Social connections:    Talks on phone: Not on file    Gets together: Not on file    Attends religious service: Not on file    Active member of club or organization: Not on file    Attends meetings of clubs or organizations: Not on file    Relationship status: Not on file  Other Topics Concern  . Not on file  Social History Narrative  . Not on file   Past Surgical History:  Procedure Laterality Date  . ABDOMINAL HYSTERECTOMY    . BILATERAL SALPINGOOPHORECTOMY  8/03   Lap BSO (R ovarian fibroma) and adhesion lysis  . BOWEL  RESECTION  2003   ileocolectomy with anastomosis 2/2 adhesions  . BREAST BIOPSY    . BREAST LUMPECTOMY Right   . BREAST LUMPECTOMY WITH NEEDLE LOCALIZATION AND AXILLARY SENTINEL LYMPH NODE BX Right 04/22/2013   Procedure: BREAST LUMPECTOMY WITH NEEDLE LOCALIZATION AND AXILLARY SENTINEL LYMPH NODE BX;  Surgeon: Stark Klein, MD;  Location: [facility];  Service: General;  Laterality: Right;  . CARDIAC CATHETERIZATION     2 stents  . CHOLECYSTECTOMY    . HEMICOLECTOMY     R sided hemicolectomy  . HERNIA REPAIR     Ventral hernia repair  . PTCA  4/06   Past Medical History:  Diagnosis Date  . Abdominal discomfort    Chronic N/V/D. Presumptive dx Crohn's dx per elevated p ANCA. Failed Entocort and Pentasa. Sep 2003 - ileocolectomy c anastomosis per Dr Deon Pilling 2/2 adhesions - path was hegative for Crohns. EGD,  Sm bowel follow through (11/03), and an eteroclysis (10/03) were unrevealing. Cuases hypomag and hypocalcemia.  . Adnexal mass 8/03   s/p lap BSO (R ovarian fibroma) & lysis of adhesions  . Allergy    Seasonal  . Anemia    Multifactorial. Baseline HgB 10-11 ish. B12 def - 150 in 3/10. Fe Def - ferritin 35 3/10. Both are being repleted.  . Breast cancer ([place]) 03/16/13   right, 5 o'clock  . CAD (coronary artery disease) 1996   1996 - PTCA and angioplasty diagonal branch. 2000 - Rotoblator & angiopllasty of diagonal. 2006 - subendocardial AMI, DES to proximal LAD.Marland Kitchen Also had 90% stenosis in distal apical LAD. EF 55 with apical hypokinesis. Indefinite ASA and Plavix.  . CHF (congestive heart failure) ([place])   . Chronic kidney disease    Chronic renal insuff baseline Cr 1.2 - 1.4 ish.  . Chronic pain    CT 10/10 = Spinal stenosis L2 - S1.  . Diabetes mellitus    Insulin dependent  . Hx of radiation therapy 06/02/13- 07/16/13   right rbeast 4500 cGy 25 sessions, right breast boost 1600 cGy in 8 sessions  . Hyperlipidemia    Managed with both a statin and Welchol. Welchol stopped 2014 2/2 cost and started on fenofibrate   . Hypertension    2006 B renal arteries patent. 2003 MRA - no RAS. 2003 pheo W/U Dr Hassell Done reportedly negative.  . Hypoxia 07/23/2017  . Lupus ([place])   . Personal history of radiation therapy   . RBBB   . SBO (small bowel obstruction) ([place]) 09/17/2017  . Stroke [facility])    Incidental finding MRI 2002 L lacunar infarct  . Wears dentures    top   BP (!) 143/78   Pulse 92   Resp 14   Ht 5' 4"  (1.626 m)   Wt 180 lb (81.6 kg)   SpO2 98%   BMI 30.90 kg/m   Opioid Risk Score:   Fall Risk Score:  `1  Depression screen PHQ 2/9  Depression screen [facility] 2/9 07/23/2017 04/18/2017 12/20/2016 10/16/2016 10/11/2016 06/11/2016 05/24/2016  Decreased Interest 0 0 0 0 0 0 0  Down, Depressed, Hopeless 0 0 0 0 0 0 0  PHQ - 2 Score 0 0 0 0 0 0 0  Some recent data might be hidden    Review of  Systems  Constitutional: Negative.   HENT: Negative.   Eyes: Negative.   Respiratory: Negative.   Endocrine: Negative.   Genitourinary: Negative.   Musculoskeletal: Positive for back pain.  Allergic/Immunologic: Negative.   Neurological:  Neuropathic pain   Psychiatric/Behavioral: Positive for sleep disturbance.  All other systems reviewed and are negative.      Objective:   Physical Exam Vitals signs and nursing note reviewed.  Constitutional:      Appearance: Normal appearance. She is obese.  HENT:     Head: Normocephalic and atraumatic.     Mouth/Throat:     Mouth: Mucous membranes are moist.  Eyes:     Extraocular Movements: Extraocular movements intact.     Pupils: Pupils are equal, round, and reactive to light.  Skin:    General: Skin is warm and dry.  Neurological:     General: No focal deficit present.     Mental Status: She is alert and oriented to person, place, and time.  Psychiatric:        Mood and Affect: Mood normal.    Diminished sensation left greater than right L3, right S1 greater than right L5 Motor strength is 5/5 bilateral hip flexors knee extensors ankle dorsiflexors Negative straight leg raise Absent Right achilles reflex, 1+ on the left 2+ bilateral patellar reflexes Gait without evidence of toe drag or knee instability no assistive device       Assessment & Plan:  1.  Lumbar spinal stenosis with right S1 radicular symptoms.  She has reduction in her S1 sensation as well as reflex on the right side.  In addition her lumbar MRI from 12/21/2017 demonstrates L5-S1 disc protrusion which may affect either S1 nerve root. Will recommend S1 transforaminal injection next month.  Discussed with patient agrees with plan.

## 2018-02-28 NOTE — Patient Instructions (Signed)
Will do injection at Right S1 level to help with Right foot pain

## 2018-03-02 LAB — MYOCARDIAL PERFUSION IMAGING
CHL CUP NUCLEAR SRS: 4
LV sys vol: 54 mL
LVDIAVOL: 86 mL (ref 46–106)
Peak HR: 127 {beats}/min
Rest HR: 88 {beats}/min
SDS: 8
SSS: 12
TID: 0.98

## 2018-03-03 ENCOUNTER — Ambulatory Visit: Payer: Medicare Other | Admitting: Cardiovascular Disease

## 2018-03-06 ENCOUNTER — Other Ambulatory Visit: Payer: Medicare Other

## 2018-03-07 ENCOUNTER — Other Ambulatory Visit: Payer: Medicare Other

## 2018-03-10 ENCOUNTER — Other Ambulatory Visit: Payer: Medicare Other

## 2018-03-14 ENCOUNTER — Ambulatory Visit
Admission: RE | Admit: 2018-03-14 | Discharge: 2018-03-14 | Disposition: A | Discharge status: Home / Self Care | Payer: Medicare Other | Source: Ambulatory Visit | Attending: Nephrology | Admitting: Nephrology

## 2018-03-14 DIAGNOSIS — N183 Chronic kidney disease, stage 3 (moderate): Principal | ICD-10-CM

## 2018-03-14 DIAGNOSIS — I823 Embolism and thrombosis of renal vein: Secondary | ICD-10-CM

## 2018-03-14 DIAGNOSIS — D631 Anemia in chronic kidney disease: Secondary | ICD-10-CM

## 2018-03-14 DIAGNOSIS — N2581 Secondary hyperparathyroidism of renal origin: Secondary | ICD-10-CM

## 2018-03-14 DIAGNOSIS — N189 Chronic kidney disease, unspecified: Secondary | ICD-10-CM

## 2018-03-14 DIAGNOSIS — N1831 Chronic kidney disease, stage 3a: Secondary | ICD-10-CM

## 2018-03-24 ENCOUNTER — Ambulatory Visit
Admission: RE | Admit: 2018-03-24 | Discharge: 2018-03-24 | Disposition: A | Discharge status: Home / Self Care | Payer: Medicare Other | Source: Ambulatory Visit | Attending: Hematology | Admitting: Hematology

## 2018-03-24 DIAGNOSIS — C50311 Malignant neoplasm of lower-inner quadrant of right female breast: Secondary | ICD-10-CM

## 2018-03-24 DIAGNOSIS — Z170 Estrogen receptor positive status [ER+]: Principal | ICD-10-CM

## 2018-04-04 ENCOUNTER — Ambulatory Visit (INDEPENDENT_AMBULATORY_CARE_PROVIDER_SITE_OTHER): Payer: Medicare Other | Admitting: Internal Medicine

## 2018-04-04 ENCOUNTER — Ambulatory Visit (HOSPITAL_BASED_OUTPATIENT_CLINIC_OR_DEPARTMENT_OTHER): Payer: Medicare Other | Admitting: Physical Medicine & Rehabilitation

## 2018-04-04 ENCOUNTER — Encounter: Payer: Medicare Other | Attending: Physical Medicine & Rehabilitation

## 2018-04-04 ENCOUNTER — Encounter: Payer: Self-pay | Admitting: Internal Medicine

## 2018-04-04 ENCOUNTER — Encounter: Payer: Self-pay | Admitting: Physical Medicine & Rehabilitation

## 2018-04-04 VITALS — BP 142/60 | HR 88 | Ht 63.0 in | Wt 185.4 lb

## 2018-04-04 VITALS — BP 157/74 | HR 80 | Resp 16 | Ht 63.0 in | Wt 185.0 lb

## 2018-04-04 DIAGNOSIS — Z801 Family history of malignant neoplasm of trachea, bronchus and lung: Secondary | ICD-10-CM | POA: Insufficient documentation

## 2018-04-04 DIAGNOSIS — I2510 Atherosclerotic heart disease of native coronary artery without angina pectoris: Secondary | ICD-10-CM | POA: Diagnosis not present

## 2018-04-04 DIAGNOSIS — Z841 Family history of disorders of kidney and ureter: Secondary | ICD-10-CM | POA: Diagnosis not present

## 2018-04-04 DIAGNOSIS — K625 Hemorrhage of anus and rectum: Secondary | ICD-10-CM

## 2018-04-04 DIAGNOSIS — Z853 Personal history of malignant neoplasm of breast: Secondary | ICD-10-CM | POA: Insufficient documentation

## 2018-04-04 DIAGNOSIS — M5417 Radiculopathy, lumbosacral region: Secondary | ICD-10-CM

## 2018-04-04 DIAGNOSIS — Z8674 Personal history of sudden cardiac arrest: Secondary | ICD-10-CM | POA: Diagnosis not present

## 2018-04-04 DIAGNOSIS — Z90710 Acquired absence of both cervix and uterus: Secondary | ICD-10-CM | POA: Diagnosis not present

## 2018-04-04 DIAGNOSIS — R490 Dysphonia: Secondary | ICD-10-CM | POA: Diagnosis not present

## 2018-04-04 DIAGNOSIS — G9340 Encephalopathy, unspecified: Secondary | ICD-10-CM | POA: Diagnosis present

## 2018-04-04 DIAGNOSIS — Z955 Presence of coronary angioplasty implant and graft: Secondary | ICD-10-CM | POA: Diagnosis not present

## 2018-04-04 DIAGNOSIS — M48061 Spinal stenosis, lumbar region without neurogenic claudication: Secondary | ICD-10-CM | POA: Insufficient documentation

## 2018-04-04 DIAGNOSIS — K5090 Crohn's disease, unspecified, without complications: Secondary | ICD-10-CM | POA: Insufficient documentation

## 2018-04-04 DIAGNOSIS — R5381 Other malaise: Secondary | ICD-10-CM | POA: Insufficient documentation

## 2018-04-04 DIAGNOSIS — Z800 Family history of malignant neoplasm of digestive organs: Secondary | ICD-10-CM | POA: Insufficient documentation

## 2018-04-04 DIAGNOSIS — Z9889 Other specified postprocedural states: Secondary | ICD-10-CM | POA: Insufficient documentation

## 2018-04-04 DIAGNOSIS — D638 Anemia in other chronic diseases classified elsewhere: Secondary | ICD-10-CM

## 2018-04-04 DIAGNOSIS — I5022 Chronic systolic (congestive) heart failure: Secondary | ICD-10-CM | POA: Insufficient documentation

## 2018-04-04 DIAGNOSIS — Z9049 Acquired absence of other specified parts of digestive tract: Secondary | ICD-10-CM | POA: Insufficient documentation

## 2018-04-04 DIAGNOSIS — Z803 Family history of malignant neoplasm of breast: Secondary | ICD-10-CM | POA: Insufficient documentation

## 2018-04-04 DIAGNOSIS — I130 Hypertensive heart and chronic kidney disease with heart failure and stage 1 through stage 4 chronic kidney disease, or unspecified chronic kidney disease: Secondary | ICD-10-CM | POA: Insufficient documentation

## 2018-04-04 DIAGNOSIS — Z8042 Family history of malignant neoplasm of prostate: Secondary | ICD-10-CM | POA: Diagnosis not present

## 2018-04-04 DIAGNOSIS — Z794 Long term (current) use of insulin: Secondary | ICD-10-CM | POA: Insufficient documentation

## 2018-04-04 DIAGNOSIS — E11649 Type 2 diabetes mellitus with hypoglycemia without coma: Secondary | ICD-10-CM | POA: Insufficient documentation

## 2018-04-04 DIAGNOSIS — M329 Systemic lupus erythematosus, unspecified: Secondary | ICD-10-CM | POA: Insufficient documentation

## 2018-04-04 DIAGNOSIS — Z8673 Personal history of transient ischemic attack (TIA), and cerebral infarction without residual deficits: Secondary | ICD-10-CM | POA: Insufficient documentation

## 2018-04-04 DIAGNOSIS — G8929 Other chronic pain: Secondary | ICD-10-CM | POA: Insufficient documentation

## 2018-04-04 DIAGNOSIS — Z8249 Family history of ischemic heart disease and other diseases of the circulatory system: Secondary | ICD-10-CM | POA: Insufficient documentation

## 2018-04-04 DIAGNOSIS — Z8709 Personal history of other diseases of the respiratory system: Secondary | ICD-10-CM | POA: Diagnosis not present

## 2018-04-04 DIAGNOSIS — I4510 Unspecified right bundle-branch block: Secondary | ICD-10-CM | POA: Insufficient documentation

## 2018-04-04 DIAGNOSIS — E1122 Type 2 diabetes mellitus with diabetic chronic kidney disease: Secondary | ICD-10-CM | POA: Insufficient documentation

## 2018-04-04 DIAGNOSIS — E785 Hyperlipidemia, unspecified: Secondary | ICD-10-CM | POA: Insufficient documentation

## 2018-04-04 DIAGNOSIS — I252 Old myocardial infarction: Secondary | ICD-10-CM | POA: Insufficient documentation

## 2018-04-04 NOTE — Progress Notes (Signed)
  PROCEDURE RECORD  Physical Medicine and Rehabilitation   Name: Kirston [name] Luscher DOB:14-Aug-1945 MRN: [id number]  Date:04/04/2018  Physician: Alysia Penna, MD    Nurse/CMA: Nysir Fergusson, CMA  Allergies:  Allergies  Allergen Reactions  . Percocet [Oxycodone-Acetaminophen] Other (See Comments)    headaches  . Diazepam Other (See Comments)    REACTION: Agitation  . Haloperidol Lactate Nausea And Vomiting  . Lorazepam Nausea Only  . Morphine Sulfate Other (See Comments)    REACTION: Agitation  . Propoxyphene Hcl Nausea And Vomiting  . Tramadol Hcl Nausea And Vomiting  . Anesthetics, Halogenated     Per patient's daughter, she can not tolerate anesthetics.     Consent Signed: Yes.    Is patient diabetic? Yes.    CBG today? 142  Pregnant: No. LMP: No LMP recorded. Patient has had a hysterectomy. (age [age])  Anticoagulants: no Anti-inflammatory: no Antibiotics: no  Procedure: right S1 transforaminal epidural steroid injection Position: Prone Start Time: 12:51pm  End Time: 12:57pm  Fluoro Time: 14s  RN/CMA Halia Franey, CMA Kia Varnadore, CMA    Time 12:35pm 1:00pm    BP 157/74 161/68    Pulse 80 81    Respirations 14 14    O2 Sat 98 95    S/S 6 6    Pain Level 8/10 1/10     D/C home with daughter, patient A & O X 3, D/C instructions reviewed, and sits independently.

## 2018-04-04 NOTE — Progress Notes (Signed)
Right S1 transforaminal epidural steroid injection under fluoroscopic guidance  Indication: Lumbosacral radiculitis is not relieved by medication management or other conservative care and interfering with self-care and mobility.   Informed consent was obtained after describing risk and benefits of the procedure with the patient, this includes bleeding, bruising, infection, paralysis and medication side effects.  The patient wishes to proceed and has given written consent.  Patient was placed in prone position.  The lumbar area was marked and prepped with Betadine.  It was entered with a 25-gauge 1-1/2 inch needle and one mL of 1% lidocaine was injected into the skin and subcutaneous tissue.  Then a 22-gauge 3.5in spinal needle was inserted into the Right S1 sacral  foramen under AP, lateral, and oblique view.  Then a solution containing one mL of 10 mg per mL dexamethasone and 2 mL of 1% lidocaine was injected.  The patient tolerated procedure well.  Post procedure instructions were given.  Please see post procedure form.  Pre injection - 8/10  Post injection- 1/10

## 2018-04-04 NOTE — Patient Instructions (Signed)
You received an epidural steroid injection under fluoroscopic guidance. This is the most accurate way to perform an epidural injection. This injection was performed to relieve thigh or leg or foot pain that may be related to a pinched nerve in the lumbar spine. The local anesthetic injected today may cause numbness in your leg for a couple hours. If it is severe we may need to observe you for 30-60 minutes after the injection. The cortisone medicine injected today may take several days to take full effect. This medicine can also cause facial flushing or feeling of being warm.  This injection may last for days weeks or months. It can be repeated if needed. If it is not effective, another spinal level may need to be injected. Other treatments include medication management as well as physical therapy. In some cases surgery may be an option.  

## 2018-04-04 NOTE — Patient Instructions (Signed)
  You have been scheduled for a colonoscopy. Please follow written instructions given to you at your visit today.  Please pick up your prep supplies at the pharmacy within the next 1-3 days. If you use inhalers (even only as needed), please bring them with you on the day of your procedure.   I appreciate the opportunity to care for you. Silvano Rusk, MD, [facility]

## 2018-04-04 NOTE — Progress Notes (Signed)
[name] 73 y.o. 11/16/1945 [id number]  Assessment & Plan:   Encounter Diagnoses  Name Primary?  . Rectal bleeding Yes  . Anemia of chronic disease     Schedule colonoscopy to evaluate the rectal bleeding and she is nearly due for a screening colonoscopy as well.  I think her anemia is anemia of chronic disease.  That is pretty well documented and taken care of by Dr. Burr Medico.  The risks and benefits as well as alternatives of endoscopic procedure(s) have been discussed and reviewed. All questions answered. The patient agrees to proceed.   I appreciate the opportunity to care for this patient. CC: Seward Carol, MD Dr. Brand Males    Subjective:   Chief Complaint: Anemia  HPI The patient is here at the request of Dr. Chase Caller because of anemia and decreased hemoglobin.  She has a complicated medical history including multiple bowel resections in the past and a history of chronic diarrhea that was of unclear etiology.  She had respiratory failure septic shock and pneumonia and had a prolonged hospitalization and rehab stay in May.  She had a hypoglycemia admission in June and a small bowel obstruction in July.  She has been improving but she has had anemia and Dr. Chase Caller wondered about the possibility of a GI cause.  She is followed by Dr. Burr Medico in hematology for anemia of chronic disease in the setting of her chronic kidney disease.  Her ferritin level is okay.  She may need to start erythropoietin.  A month or so ago she had rectal bleeding over a couple of days it was isolated she saw Dr. Delfina Redwood he assessed her and told her to observe.  It has not recurred. Allergies  Allergen Reactions  . Percocet [Oxycodone-Acetaminophen] Other (See Comments)    headaches  . Diazepam Other (See Comments)    REACTION: Agitation  . Haloperidol Lactate Nausea And Vomiting  . Lorazepam Nausea Only  . Morphine Sulfate Other (See Comments)    REACTION: Agitation  .  Propoxyphene Hcl Nausea And Vomiting  . Tramadol Hcl Nausea And Vomiting  . Anesthetics, Halogenated     Per patient's daughter, she can not tolerate anesthetics.    Current Meds  Medication Sig  . acetaminophen (TYLENOL) 325 MG tablet Take 650 mg by mouth every 6 (six) hours as needed for mild pain.  Marland Kitchen amLODipine (NORVASC) 10 MG tablet Take 1 tablet (10 mg total) by mouth daily.  Marland Kitchen atorvastatin (LIPITOR) 40 MG tablet Take 1 tablet (40 mg total) by mouth at bedtime.  . carvedilol (COREG) 25 MG tablet Take 25 mg by mouth as directed.  . desloratadine (CLARINEX) 5 MG tablet Take 5 mg by mouth at bedtime as needed (allergies). Reported on 04/11/2015  . fenofibrate 54 MG tablet Take 54 mg by mouth daily.   . ferrous fumarate (HEMOCYTE - 106 MG FE) 325 (106 Fe) MG TABS tablet Take 1 tablet by mouth as directed.  . furosemide (LASIX) 40 MG tablet Take 40 mg by mouth as directed.  . gabapentin (NEURONTIN) 100 MG capsule Take 1 capsule (100 mg total) by mouth 4 (four) times daily.  . hydrALAZINE (APRESOLINE) 10 MG tablet Take 10 mg by mouth as directed.  . linagliptin (TRADJENTA) 5 MG TABS tablet Take 5 mg by mouth daily.  . methocarbamol (ROBAXIN) 500 MG tablet Take 1 tablet (500 mg total) by mouth every 8 (eight) hours as needed for muscle spasms.  . nitroGLYCERIN (NITROSTAT) 0.4 MG SL tablet  Place 1 tablet (0.4 mg total) under the tongue every 5 (five) minutes as needed for chest pain.  . pantoprazole (PROTONIX) 40 MG tablet TAKE 1 TABLET BY MOUTH ONCE DAILY  . potassium chloride SA (K-DUR,KLOR-CON) 20 MEQ tablet Take 1 tablet (20 mEq total) by mouth daily as needed (when taking lasix).  Marland Kitchen ULORIC 40 MG tablet Take 1 tablet by mouth daily.   Past Medical History:  Diagnosis Date  . Abdominal discomfort    Chronic N/V/D. Presumptive dx Crohn's dx per elevated p ANCA. Failed Entocort and Pentasa. Sep 2003 - ileocolectomy c anastomosis per Dr Deon Pilling 2/2 adhesions - path was hegative for Crohns. EGD,  Sm bowel follow through (11/03), and an eteroclysis (10/03) were unrevealing. Cuases hypomag and hypocalcemia.  . Adnexal mass 8/03   s/p lap BSO (R ovarian fibroma) & lysis of adhesions  . Allergy    Seasonal  . Anemia    Multifactorial. Baseline HgB 10-11 ish. B12 def - 150 in 3/10. Fe Def - ferritin 35 3/10. Both are being repleted.  . Breast cancer ([place]) 03/16/13   right, 5 o'clock  . CAD (coronary artery disease) 1996   1996 - PTCA and angioplasty diagonal branch. 2000 - Rotoblator & angiopllasty of diagonal. 2006 - subendocardial AMI, DES to proximal LAD.Marland Kitchen Also had 90% stenosis in distal apical LAD. EF 55 with apical hypokinesis. Indefinite ASA and Plavix.  . CHF (congestive heart failure) ([place])   . Chronic kidney disease    Chronic renal insuff baseline Cr 1.2 - 1.4 ish.  . Chronic pain    CT 10/10 = Spinal stenosis L2 - S1.  . Diabetes mellitus    Insulin dependent  . Hx of radiation therapy 06/02/13- 07/16/13   right rbeast 4500 cGy 25 sessions, right breast boost 1600 cGy in 8 sessions  . Hyperlipidemia    Managed with both a statin and Welchol. Welchol stopped 2014 2/2 cost and started on fenofibrate   . Hypertension    2006 B renal arteries patent. 2003 MRA - no RAS. 2003 pheo W/U Dr Hassell Done reportedly negative.  . Hypoxia 07/23/2017  . Lupus ([place])   . Personal history of radiation therapy   . RBBB   . SBO (small bowel obstruction) ([place]) 09/17/2017  . Stroke [facility])    Incidental finding MRI 2002 L lacunar infarct  . Wears dentures    top   Past Surgical History:  Procedure Laterality Date  . ABDOMINAL HYSTERECTOMY    . BILATERAL SALPINGOOPHORECTOMY  8/03   Lap BSO (R ovarian fibroma) and adhesion lysis  . BOWEL RESECTION  2003   ileocolectomy with anastomosis 2/2 adhesions  . BREAST BIOPSY    . BREAST LUMPECTOMY Right   . BREAST LUMPECTOMY WITH NEEDLE LOCALIZATION AND AXILLARY SENTINEL LYMPH NODE BX Right 04/22/2013   Procedure: BREAST LUMPECTOMY WITH NEEDLE  LOCALIZATION AND AXILLARY SENTINEL LYMPH NODE BX;  Surgeon: Stark Klein, MD;  Location: [facility];  Service: General;  Laterality: Right;  . CARDIAC CATHETERIZATION     2 stents  . CHOLECYSTECTOMY    . COLONOSCOPY    . HEMICOLECTOMY     R sided hemicolectomy  . HERNIA REPAIR     Ventral hernia repair  . PTCA  4/06   Social History   Social History Narrative   She is separated 2 sons 3 daughters   She has grandchildren and great-grandchildren   She is retired   Never smoker no tobacco now no drugs no alcohol  no caffeine   family history includes Breast cancer in her maternal aunt and maternal grandmother; Breast cancer (age of onset: 23) in her mother; Clotting disorder in her maternal grandmother; Colon cancer in her maternal grandmother; Diabetes in her maternal grandmother; Heart attack in her maternal grandmother; Hypertension in her daughter; Kidney failure in her maternal aunt; Lung cancer in her maternal aunt; Pancreatic cancer (age of onset: 61) in her brother; Prostate cancer in her maternal uncle.   Review of Systems As per HPI.  She has pedal edema some intermittent dyspnea back pain joint symptoms with pain and allergy troubles.    Objective:   Physical Exam @BP  (!) 142/60 (BP Location: Left Arm, Patient Position: Sitting, Cuff Size: Normal)   Pulse 88   Ht 5' 3"  (1.6 m) Comment: height measured without shoes  Wt 185 lb 6 oz (84.1 kg)   BMI 32.84 kg/m @  General:  Well-developed, well-nourished and in no acute distress Eyes:  anicteric. ENT:   dentures Neck:   supple w/o thyromegaly or mass.  Lungs: Clear to auscultation bilaterally - coarse BS Heart:   S1S2, no rubs, murmurs, gallops. Abdomen:  soft, non-tender, no hepatosplenomegaly, hernia, or mass and BS+.  Rectal: deferred Lymph:  no cervical or supraclavicular adenopathy. Skin   no rash. Neuro:  A&O x 3.  Psych:  appropriate mood and  Affect.   Data Reviewed: See HPI last colonoscopy  2010 negative

## 2018-04-07 ENCOUNTER — Other Ambulatory Visit: Payer: Self-pay | Admitting: Internal Medicine

## 2018-04-07 ENCOUNTER — Other Ambulatory Visit: Payer: Self-pay | Admitting: Physical Medicine & Rehabilitation

## 2018-04-07 DIAGNOSIS — M51369 Other intervertebral disc degeneration, lumbar region without mention of lumbar back pain or lower extremity pain: Secondary | ICD-10-CM

## 2018-04-07 DIAGNOSIS — M545 Low back pain, unspecified: Secondary | ICD-10-CM

## 2018-04-07 DIAGNOSIS — M5136 Other intervertebral disc degeneration, lumbar region: Secondary | ICD-10-CM

## 2018-04-21 ENCOUNTER — Ambulatory Visit
Admission: RE | Admit: 2018-04-21 | Discharge: 2018-04-21 | Disposition: A | Discharge status: Home / Self Care | Payer: Medicare Other | Source: Ambulatory Visit | Attending: Hematology | Admitting: Hematology

## 2018-04-21 ENCOUNTER — Telehealth: Payer: Self-pay | Admitting: Hematology

## 2018-04-21 ENCOUNTER — Other Ambulatory Visit: Payer: Self-pay | Admitting: Internal Medicine

## 2018-04-21 DIAGNOSIS — E2839 Other primary ovarian failure: Secondary | ICD-10-CM

## 2018-04-21 NOTE — Telephone Encounter (Signed)
Returned call re rescheduling 5/28 appointments - patient needs Monday. Moved appointments for 5/28 to 6/1. Left message. Schedule mailed.

## 2018-04-24 ENCOUNTER — Telehealth: Payer: Self-pay

## 2018-04-24 NOTE — Telephone Encounter (Signed)
-----   Message from Truitt Merle, MD sent at 04/22/2018 10:48 AM EST ----- Please let pt know her normal DEXA result, thanks   Truitt Merle  04/22/2018

## 2018-04-24 NOTE — Telephone Encounter (Signed)
Left voice message regarding bone density scan results. Per Dr. Burr Medico results are normal, no concerns.

## 2018-05-05 ENCOUNTER — Encounter: Payer: Self-pay | Admitting: Internal Medicine

## 2018-05-05 ENCOUNTER — Ambulatory Visit (AMBULATORY_SURGERY_CENTER): Payer: Medicare Other | Admitting: Internal Medicine

## 2018-05-05 VITALS — BP 134/71 | HR 72 | Temp 96.9°F | Resp 14 | Ht 63.0 in | Wt 185.0 lb

## 2018-05-05 DIAGNOSIS — K625 Hemorrhage of anus and rectum: Secondary | ICD-10-CM | POA: Diagnosis not present

## 2018-05-05 MED ORDER — SODIUM CHLORIDE 0.9 % IV SOLN: 500.0000 mL | Freq: Once | INTRAVENOUS | Status: DC

## 2018-05-05 NOTE — Patient Instructions (Addendum)
   There are inflamed hemorrhoids - no polyps or cancer seen. The hemorrhoids have caused the bleeding.  I appreciate the opportunity to care for you. Gatha Mayer, MD, [facility]  HANDOUT GIVEN ON HEMORRHOIDS  YOU HAD AN ENDOSCOPIC PROCEDURE TODAY AT [facility]:   Refer to the procedure report that was given to you for any specific questions about what was found during the examination.  If the procedure report does not answer your questions, please call your gastroenterologist to clarify.  If you requested that your care partner not be given the details of your procedure findings, then the procedure report has been included in a sealed envelope for you to review at your convenience later.  YOU SHOULD EXPECT: Some feelings of bloating in the abdomen. Passage of more gas than usual.  Walking can help get rid of the air that was put into your GI tract during the procedure and reduce the bloating. If you had a lower endoscopy (such as a colonoscopy or flexible sigmoidoscopy) you may notice spotting of blood in your stool or on the toilet paper. If you underwent a bowel prep for your procedure, you may not have a normal bowel movement for a few days.  Please Note:  You might notice some irritation and congestion in your nose or some drainage.  This is from the oxygen used during your procedure.  There is no need for concern and it should clear up in a day or so.  SYMPTOMS TO REPORT IMMEDIATELY:   Following lower endoscopy (colonoscopy or flexible sigmoidoscopy):  Excessive amounts of blood in the stool  Significant tenderness or worsening of abdominal pains  Swelling of the abdomen that is new, acute  Fever of 100F or higher   For urgent or emergent issues, a gastroenterologist can be reached at any hour by calling [phone].   DIET:  We do recommend a small meal at first, but then you may proceed to your regular diet.  Drink plenty of fluids but you should avoid  alcoholic beverages for 24 hours.  ACTIVITY:  You should plan to take it easy for the rest of today and you should NOT DRIVE or use heavy machinery until tomorrow (because of the sedation medicines used during the test).    FOLLOW UP: Our staff will call the number listed on your records the next business day following your procedure to check on you and address any questions or concerns that you may have regarding the information given to you following your procedure. If we do not reach you, we will leave a message.  However, if you are feeling well and you are not experiencing any problems, there is no need to return our call.  We will assume that you have returned to your regular daily activities without incident.  If any biopsies were taken you will be contacted by phone or by letter within the next 1-3 weeks.  Please call us at [phone] if you have not heard about the biopsies in 3 weeks.    SIGNATURES/CONFIDENTIALITY: You and/or your care partner have signed paperwork which will be entered into your electronic medical record.  These signatures attest to the fact that that the information above on your After Visit Summary has been reviewed and is understood.  Full responsibility of the confidentiality of this discharge information lies with you and/or your care-partner.

## 2018-05-05 NOTE — Op Note (Signed)
[facility] Patient Name: [name] Procedure Date: 05/05/2018 3:28 PM MRN: [id number] Endoscopist: Gatha Mayer , MD Age: 73 Referring MD:  Date of Birth: [birth year] Gender: Female Account #: [id number] Procedure:                Colonoscopy Indications:              Rectal bleeding Medicines:                Propofol per Anesthesia, Monitored Anesthesia Care Procedure:                Pre-Anesthesia Assessment:                           - Prior to the procedure, a History and Physical                            was performed, and patient medications and                            allergies were reviewed. The patient's tolerance of                            previous anesthesia was also reviewed. The risks                            and benefits of the procedure and the sedation                            options and risks were discussed with the patient.                            All questions were answered, and informed consent                            was obtained. Prior Anticoagulants: The patient has                            taken no previous anticoagulant or antiplatelet                            agents. ASA Grade Assessment: III - A patient with                            severe systemic disease. After reviewing the risks                            and benefits, the patient was deemed in                            satisfactory condition to undergo the procedure.                           After obtaining informed consent, the colonoscope  was passed under direct vision. Throughout the                            procedure, the patient's blood pressure, pulse, and                            oxygen saturations were monitored continuously. The                            Colonoscope was introduced through the anus and                            advanced to the the ileocolonic anastomosis. The                            colonoscopy was  performed without difficulty. The                            patient tolerated the procedure well. The quality                            of the bowel preparation was adequate. The bowel                            preparation used was Miralax. The rectum and                            Ileocolonic anastomsis areas were photographed. Scope In: 3:33:36 PM Scope Out: 3:49:41 PM Scope Withdrawal Time: 0 hours 11 minutes 56 seconds  Total Procedure Duration: 0 hours 16 minutes 5 seconds  Findings:                 The perianal and digital rectal examinations were                            normal.                           There was evidence of a prior end-to-side                            ileo-colonic anastomosis in the transverse colon.                            This was patent and was characterized by healthy                            appearing mucosa.                           External and internal hemorrhoids were found.                           The exam was otherwise without abnormality on  direct and retroflexion views. Complications:            No immediate complications. Estimated Blood Loss:     Estimated blood loss: none. Impression:               - Patent end-to-side ileo-colonic anastomosis,                            characterized by healthy appearing mucosa.                           - External and internal hemorrhoids.                           - The examination was otherwise normal on direct                            and retroflexion views.                           - No specimens collected. Recommendation:           - Patient has a contact number available for                            emergencies. The signs and symptoms of potential                            delayed complications were discussed with the                            patient. Return to normal activities tomorrow.                            Written discharge instructions were  provided to the                            patient.                           - Resume previous diet.                           - Continue present medications.                           - No repeat colonoscopy due to age and the absence                            of colonic polyps.                           - Treat hemorrhoids conservatively - do not think                            bleeding is that common.  She has anemia of chronic disease - rectal bleeding                            not related to the anemia. Gatha Mayer, MD 05/05/2018 3:58:54 PM This report has been signed electronically.

## 2018-05-05 NOTE — Progress Notes (Signed)
Report given to PACU, vss 

## 2018-05-06 ENCOUNTER — Telehealth: Payer: Self-pay

## 2018-05-06 NOTE — Telephone Encounter (Signed)
  Follow up Call-  Call back number 05/05/2018  Post procedure Call Back phone  # [phone]  Permission to leave phone message Yes  Some recent data might be hidden     Patient questions:  Do you have a fever, pain , or abdominal swelling? No. Pain Score  0 *  Have you tolerated food without any problems? Yes.    Have you been able to return to your normal activities? Yes.    Do you have any questions about your discharge instructions: Diet   No. Medications  No. Follow up visit  No.  Do you have questions or concerns about your Care? No.  Actions: * If pain score is 4 or above: No action needed, pain <4.  No problems noted per pt. maw

## 2018-05-26 ENCOUNTER — Telehealth: Payer: Self-pay

## 2018-05-26 NOTE — Telephone Encounter (Signed)
Faxed signed order back to [facility], sent to HIM for scanning to chart.

## 2018-05-30 ENCOUNTER — Telehealth: Payer: Self-pay

## 2018-05-30 NOTE — Telephone Encounter (Signed)
Faxed signed order back to [facility], sent to HIM for scanning to chart.

## 2018-06-08 ENCOUNTER — Other Ambulatory Visit: Payer: Self-pay | Admitting: Internal Medicine

## 2018-07-07 ENCOUNTER — Ambulatory Visit: Payer: Medicare Other | Admitting: Physical Medicine & Rehabilitation

## 2018-07-18 NOTE — Telephone Encounter (Signed)
A user error has taken place: encounter opened in error, closed for administrative reasons.

## 2018-07-30 ENCOUNTER — Telehealth: Payer: Self-pay | Admitting: Cardiovascular Disease

## 2018-07-30 NOTE — Telephone Encounter (Signed)
New message     Called to convert 08-06-18 office visit to a virtual video visit.  Patient gave consent for video visit.  YOUR CARDIOLOGY TEAM HAS ARRANGED FOR AN E-VISIT FOR YOUR APPOINTMENT - PLEASE REVIEW IMPORTANT INFORMATION BELOW SEVERAL DAYS PRIOR TO YOUR APPOINTMENT  Due to the recent COVID-19 pandemic, we are transitioning in-person office visits to tele-medicine visits in an effort to decrease unnecessary exposure to our patients, their families, and staff. These visits are billed to your insurance just like a normal visit is. We also encourage you to sign up for MyChart if you have not already done so. You will need a smartphone if possible. For patients that do not have this, we can still complete the visit using a regular telephone but do prefer a smartphone to enable video when possible. You may have a family member that lives with you that can help. If possible, we also ask that you have a blood pressure cuff and scale at home to measure your blood pressure, heart rate and weight prior to your scheduled appointment. Patients with clinical needs that need an in-person evaluation and testing will still be able to come to the office if absolutely necessary. If you have any questions, feel free to call our office.     YOUR PROVIDER WILL BE USING THE FOLLOWING PLATFORM TO COMPLETE YOUR VISIT: Doxy Me   IF USING MYCHART - How to Download the MyChart App to Your SmartPhone   - If Apple, go to [employer] and type in MyChart in the search bar and download the app. If Android, ask patient to go to [employer] and type in [place] in the search bar and download the app. The app is free but as with any other app downloads, your phone may require you to verify saved payment information or Apple/Android password.  - You will need to then log into the app with your MyChart username and password, and select [facility] as your healthcare provider to link the account.  - When it is time for  your visit, go to the MyChart app, find appointments, and click Begin Video Visit. Be sure to Select Allow for your device to access the Microphone and Camera for your visit. You will then be connected, and your provider will be with you shortly.  **If you have any issues connecting or need assistance, please contact MyChart service desk [phone])**  **If using a computer, in order to ensure the best quality for your visit, you will need to use either of the following Internet Browsers: Insurance underwriter or Microsoft Edge**   IF USING DOXIMITY or [email or web address] - The staff will give you instructions on receiving your link to join the meeting the day of your visit.      2-3 DAYS BEFORE YOUR APPOINTMENT  You will receive a telephone call from one of our [facility] team members - your caller ID may say "Unknown caller." If this is a video visit, we will walk you through how to get the video launched on your phone. We will remind you check your blood pressure, heart rate and weight prior to your scheduled appointment. If you have an Apple Watch or Kardia, please upload any pertinent ECG strips the day before or morning of your appointment to [facility]. Our staff will also make sure you have reviewed the consent and agree to move forward with your scheduled tele-health visit.     THE DAY OF YOUR APPOINTMENT  Approximately 15  minutes prior to your scheduled appointment, you will receive a telephone call from one of [facility] team - your caller ID may say "Unknown caller."  Our staff will confirm medications, vital signs for the day and any symptoms you may be experiencing. Please have this information available prior to the time of visit start. It may also be helpful for you to have a pad of paper and pen handy for any instructions given during your visit. They will also walk you through joining the smartphone meeting if this is a video visit.    CONSENT FOR TELE-HEALTH VISIT - PLEASE  REVIEW  I hereby voluntarily request, consent and authorize [facility] and its employed or contracted physicians, physician assistants, nurse practitioners or other licensed health care professionals (the Practitioner), to provide me with telemedicine health care services (the Services") as deemed necessary by the treating Practitioner. I acknowledge and consent to receive the Services by the Practitioner via telemedicine. I understand that the telemedicine visit will involve communicating with the Practitioner through live audiovisual communication technology and the disclosure of certain medical information by electronic transmission. I acknowledge that I have been given the opportunity to request an in-person assessment or other available alternative prior to the telemedicine visit and am voluntarily participating in the telemedicine visit.  I understand that I have the right to withhold or withdraw my consent to the use of telemedicine in the course of my care at any time, without affecting my right to future care or treatment, and that the Practitioner or I may terminate the telemedicine visit at any time. I understand that I have the right to inspect all information obtained and/or recorded in the course of the telemedicine visit and may receive copies of available information for a reasonable fee.  I understand that some of the potential risks of receiving the Services via telemedicine include:   Delay or interruption in medical evaluation due to technological equipment failure or disruption;  Information transmitted may not be sufficient (e.g. poor resolution of images) to allow for appropriate medical decision making by the Practitioner; and/or   In rare instances, security protocols could fail, causing a breach of personal health information.  Furthermore, I acknowledge that it is my responsibility to provide information about my medical history, conditions and care that is complete and  accurate to the best of my ability. I acknowledge that Practitioner's advice, recommendations, and/or decision may be based on factors not within their control, such as incomplete or inaccurate data provided by me or distortions of diagnostic images or specimens that may result from electronic transmissions. I understand that the practice of medicine is not an exact science and that Practitioner makes no warranties or guarantees regarding treatment outcomes. I acknowledge that I will receive a copy of this consent concurrently upon execution via email to the email address I last provided but may also request a printed copy by calling the office of [facility].    I understand that my insurance will be billed for this visit.   I have read or had this consent read to me.  I understand the contents of this consent, which adequately explains the benefits and risks of the Services being provided via telemedicine.   I have been provided ample opportunity to ask questions regarding this consent and the Services and have had my questions answered to my satisfaction.  I give my informed consent for the services to be provided through the use of telemedicine in my medical care  By participating in this telemedicine visit I agree to the above.

## 2018-07-30 NOTE — Progress Notes (Signed)
Virtual Visit via Video Note   This visit type was conducted due to national recommendations for restrictions regarding the COVID-19 Pandemic (e.g. social distancing) in an effort to limit this patient's exposure and mitigate transmission in our community.  Due to her co-morbid illnesses, this patient is at least at moderate risk for complications without adequate follow up.  This format is felt to be most appropriate for this patient at this time.  All issues noted in this document were discussed and addressed.  A limited physical exam was performed with this format.  Please refer to the patient's chart for her consent to telehealth for [facility].   Date:  08/06/2018   ID:  [name], DOB 1945/04/11, MRN [id number]  Patient Location: Home Provider Location: Office  PCP:  Seward Carol, MD  Cardiologist:  [name], MD   Electrophysiologist:  None   Evaluation Performed:  Follow-Up Visit  Chief Complaint:  CAD  History of Present Illness:     [name] is seen today for F/U of CAD with pevious rotoblator to the LAD in 2000. Her last myovue on 02/27/18 showed mild apical thinning and was low risk. She has not had any SSCP and has been compliant with her meds.Her BP is alwyas hard to Rx and she is on multiple drugs with persistant mild tachycardia. She denies dyspnea, PND orthopnea. Has some swelling in her feet. Diet still an issue with her DM.   June 2019 admitted with sepsis syndrome pneumonia She had alveolar hemorrhage and brief PEA Arrest post bronchoscopy and was intubated for days and d/c to inpatient rehab   Breast cancer and sees Dr Burr Medico was on Arimidex but hospitalized in July 2019 with SBO pneumonia and pancreatic cyst. Noted to have renal vein thrombosis and placed on xarelto For 6 months   Last TTE done 07/27/17 reviewed EF 55-60% Moderate MR  Estimated PA pressure 64 mmHg  Had left SI joint injected in March 2020   Has 4 children in area that keep  in touch with her regularly One is a NP Takea who just had a baby boy She works at [employer]   The patient does not have symptoms concerning for COVID-19 infection (fever, chills, cough, or new shortness of breath).    Past Medical History:  Diagnosis Date   Abdominal discomfort    Chronic N/V/D. Presumptive dx Crohn's dx per elevated p ANCA. Failed Entocort and Pentasa. Sep 2003 - ileocolectomy c anastomosis per Dr Deon Pilling 2/2 adhesions - path was hegative for Crohns. EGD, Sm bowel follow through (11/03), and an eteroclysis (10/03) were unrevealing. Cuases hypomag and hypocalcemia.   Adnexal mass 8/03   s/p lap BSO (R ovarian fibroma) & lysis of adhesions   Allergy    Seasonal   Anemia    Multifactorial. Baseline HgB 10-11 ish. B12 def - 150 in 3/10. Fe Def - ferritin 35 3/10. Both are being repleted.   Breast cancer ([facility]) 03/16/13   right, 5 o'clock   CAD (coronary artery disease) 1996   1996 - PTCA and angioplasty diagonal branch. 2000 - Rotoblator & angiopllasty of diagonal. 2006 - subendocardial AMI, DES to proximal LAD.Marland Kitchen Also had 90% stenosis in distal apical LAD. EF 55 with apical hypokinesis. Indefinite ASA and Plavix.   CHF (congestive heart failure) (HCC)    Chronic kidney disease    Chronic renal insuff baseline Cr 1.2 - 1.4 ish.   Chronic pain    CT 10/10 = Spinal stenosis L2 -  S1.   Diabetes mellitus    Insulin dependent   Hx of radiation therapy 06/02/13- 07/16/13   right rbeast 4500 cGy 25 sessions, right breast boost 1600 cGy in 8 sessions   Hyperlipidemia    Managed with both a statin and Welchol. Welchol stopped 2014 2/2 cost and started on fenofibrate    Hypertension    2006 B renal arteries patent. 2003 MRA - no RAS. 2003 pheo W/U Dr Hassell Done reportedly negative.   Hypoxia 07/23/2017   Lupus ([place])    Personal history of radiation therapy    RBBB    SBO (small bowel obstruction) ([place]) 09/17/2017   Stroke ([place])    Incidental finding MRI 2002  L lacunar infarct   Wears dentures    top   Past Surgical History:  Procedure Laterality Date   ABDOMINAL HYSTERECTOMY     BILATERAL SALPINGOOPHORECTOMY  8/03   Lap BSO (R ovarian fibroma) and adhesion lysis   BOWEL RESECTION  2003   ileocolectomy with anastomosis 2/2 adhesions   BREAST BIOPSY     BREAST LUMPECTOMY Right    BREAST LUMPECTOMY WITH NEEDLE LOCALIZATION AND AXILLARY SENTINEL LYMPH NODE BX Right 04/22/2013   Procedure: BREAST LUMPECTOMY WITH NEEDLE LOCALIZATION AND AXILLARY SENTINEL LYMPH NODE BX;  Surgeon: Stark Klein, MD;  Location: [facility];  Service: General;  Laterality: Right;   CARDIAC CATHETERIZATION     2 stents   CHOLECYSTECTOMY     COLONOSCOPY     HEMICOLECTOMY     R sided hemicolectomy   HERNIA REPAIR     Ventral hernia repair   PTCA  4/06     Current Meds  Medication Sig   acetaminophen (TYLENOL) 325 MG tablet Take 650 mg by mouth every 6 (six) hours as needed for mild pain.   amLODipine (NORVASC) 10 MG tablet Take 1 tablet (10 mg total) by mouth daily.   atorvastatin (LIPITOR) 40 MG tablet Take 1 tablet (40 mg total) by mouth at bedtime.   carvedilol (COREG) 25 MG tablet Take 25 mg by mouth as directed.   desloratadine (CLARINEX) 5 MG tablet Take 5 mg by mouth at bedtime as needed (allergies). Reported on 04/11/2015   fenofibrate 54 MG tablet Take 54 mg by mouth daily.    ferrous fumarate (HEMOCYTE - 106 MG FE) 325 (106 Fe) MG TABS tablet Take 1 tablet by mouth as directed.   furosemide (LASIX) 40 MG tablet Take 40 mg by mouth as directed.   gabapentin (NEURONTIN) 100 MG capsule Take 1 capsule (100 mg total) by mouth 4 (four) times daily.   hydrALAZINE (APRESOLINE) 10 MG tablet Take 10 mg by mouth as directed.   linagliptin (TRADJENTA) 5 MG TABS tablet Take 5 mg by mouth daily.   nitroGLYCERIN (NITROSTAT) 0.4 MG SL tablet Place 1 tablet (0.4 mg total) under the tongue every 5 (five) minutes as needed for chest  pain.   pantoprazole (PROTONIX) 40 MG tablet TAKE 1 TABLET BY MOUTH ONCE DAILY   potassium chloride SA (K-DUR,KLOR-CON) 20 MEQ tablet Take 1 tablet (20 mEq total) by mouth daily as needed (when taking lasix).   ULORIC 40 MG tablet Take 1 tablet by mouth daily.     Allergies:   Percocet [oxycodone-acetaminophen]; Diazepam; Haloperidol lactate; Lorazepam; Morphine sulfate; Propoxyphene hcl; Tramadol hcl; Anesthetics, halogenated; Etomidate; Fentanyl; and Versed [midazolam]   Social History   Tobacco Use   Smoking status: Never Smoker   Smokeless tobacco: Never Used  Substance Use Topics  Alcohol use: No    Alcohol/week: 0.0 standard drinks   Drug use: No     Family Hx: The patient's family history includes Breast cancer in her maternal aunt and maternal grandmother; Breast cancer (age of onset: 70) in her mother; Clotting disorder in her maternal grandmother; Colon cancer in her maternal grandmother; Diabetes in her maternal grandmother; Heart attack in her maternal grandmother; Hypertension in her daughter; Kidney failure in her maternal aunt; Lung cancer in her maternal aunt; Pancreatic cancer (age of onset: 73) in her brother; Prostate cancer in her maternal uncle.  ROS:   Please see the history of present illness.     All other systems reviewed and are negative.   Prior CV studies:   The following studies were reviewed today:  Myovue 02/27/18   Labs/Other Tests and Data Reviewed:    EKG:  SR RBBB nonspecific ST changes 08/26/17  Recent Labs: 02/05/2018: ALT 18; BUN 65; Creatinine, Ser 2.12; Hemoglobin 8.9; Platelets 342; Potassium 4.1; Sodium 138   Recent Lipid Panel Lab Results  Component Value Date/Time   CHOL 183 07/21/2015 11:06 AM   TRIG 107 07/24/2017 02:44 PM   HDL 47 07/21/2015 11:06 AM   CHOLHDL 3.9 07/21/2015 11:06 AM   CHOLHDL 3.8 09/10/2013 10:41 AM   LDLCALC 88 07/21/2015 11:06 AM   LDLDIRECT 55 07/01/2012 10:16 AM    Wt Readings from Last 3  Encounters:  08/06/18 84.8 kg  08/04/18 89.4 kg  05/05/18 83.9 kg     Objective:    Vital Signs:  BP 137/68    Pulse 72    Ht 5' 5"  (1.651 m)    Wt 84.8 kg    BMI 31.12 kg/m    Overweight black female No distress No tachypnea JVP hard to see Trace LE edema Skin warm and dry   ASSESSMENT & PLAN:    CAD: Stable with no angina and good activity level.  Continue medical Rx Non ischemic myovue  02/27/18  HTN: ARB stopped in hospital and placed on norvasc despite previous edema stable at this time but may need to change in future  Chol: Cholesterol is at goal.  Continue current dose of statin and diet Rx.   DM: Discussed low carb diet.  Target hemoglobin A1c is 6.5 or less.  Continue current medications.  Breast Cancer f/u oncology post XRT  RBBB ECG stable no change   Neuropathy stable continue neurontin Arthritis especially in hands Discussed referral to Dr Amil Amen Rheumatology   Edema:  Etiology not clear EF normal only grade one diastolic  Venous duplex normal Continue lasix   COVID-19 Education: The signs and symptoms of COVID-19 were discussed with the patient and how to seek care for testing (follow up with PCP or arrange E-visit).  The importance of social distancing was discussed today.  Time:   Today, I have spent 30 minutes with the patient with telehealth technology discussing the above problems.     Medication Adjustments/Labs and Tests Ordered: Current medicines are reviewed at length with the patient today.  Concerns regarding medicines are outlined above.   Tests Ordered: No orders of the defined types were placed in this encounter.   Medication Changes: No orders of the defined types were placed in this encounter.   Disposition:  Follow up in a year  Signed, [name], MD  08/06/2018 3:37 PM    [facility]

## 2018-08-04 ENCOUNTER — Encounter: Payer: Medicare Other | Attending: Physical Medicine & Rehabilitation | Admitting: Physical Medicine & Rehabilitation

## 2018-08-04 ENCOUNTER — Encounter: Payer: Self-pay | Admitting: Physical Medicine & Rehabilitation

## 2018-08-04 ENCOUNTER — Other Ambulatory Visit: Payer: Self-pay

## 2018-08-04 VITALS — BP 158/81 | HR 77 | Temp 98.6°F | Ht 65.0 in | Wt 197.0 lb

## 2018-08-04 DIAGNOSIS — M5417 Radiculopathy, lumbosacral region: Secondary | ICD-10-CM | POA: Diagnosis not present

## 2018-08-04 NOTE — Progress Notes (Signed)
Bilateral S1 transforaminal epidural steroid injection under fluoroscopic guidance  Indication: Lumbosacral radiculitis is not relieved by medication management or other conservative care and interfering with self-care and mobility. RIght S1 ESI in January reduced pain from 8/10- 1/10, has now worn off, also the left side is symptomatic as well.  MRI reviewed showing impingement of L5-S1 disc on bilateral S1 nerve root  Informed consent was obtained after describing risk and benefits of the procedure with the patient, this includes bleeding, bruising, infection, paralysis and medication side effects.  The patient wishes to proceed and has given written consent.  Patient was placed in prone position.  The lumbar area was marked and prepped with Betadine.  It was entered with a 25-gauge 1-1/2 inch needle and one mL of 1% lidocaine was injected into the skin and subcutaneous tissue.  Then a 22-gauge 3.5 spinal needle was inserted into the Right S1  intervertebral foramen under AP, lateral, and oblique view.  Then a solution containing one mL of 10 mg per mL dexamethasone and 2 mL of 1% lidocaine was injected.  The patient tolerated procedure well.  Post procedure instructions were given.  Please see post procedure form.

## 2018-08-04 NOTE — Progress Notes (Signed)
  PROCEDURE RECORD [facility]   Name: [name] DOB:Apr 17, 1945 MRN: [id number]  Date:08/04/2018  Physician: Alysia Penna, MD    Nurse/CMA: Acelyn Basham Cma  Allergies:  Allergies  Allergen Reactions  . Percocet [Oxycodone-Acetaminophen] Other (See Comments)    headaches  . Diazepam Other (See Comments)    REACTION: Agitation  . Haloperidol Lactate Nausea And Vomiting  . Lorazepam Nausea Only  . Morphine Sulfate Other (See Comments)    REACTION: Agitation  . Propoxyphene Hcl Nausea And Vomiting  . Tramadol Hcl Nausea And Vomiting  . Anesthetics, Halogenated     Per patient's daughter, she can not tolerate anesthetics.   . Etomidate     Heart stopped during anesthesia  . Fentanyl     Heart stopped during anesthesia   . Versed [Midazolam]     Heart stopped during anesthesia    Consent Signed: Yes.    Is patient diabetic? Yes.    CBG today? 128  Pregnant: No. LMP: No LMP recorded. Patient has had a hysterectomy. (age 31-55)  Anticoagulants: no Anti-inflammatory: no Antibiotics: no  Procedure: Right S1 vs L2-3 ESI   Position: Prone   Start Time: 1240pm End Time: 1248pm Fluoro Time: 32s  RN/CMA Devann Cribb CMA Mykell Genao CMA    Time 12:15pm 12:53pm    BP 158/81 162/73    Pulse 77 76    Respirations 16 16    O2 Sat 97 97    S/S 6 6    Pain Level 6/10 0/10     D/C home with granddaughter, patient A & O X 3, D/C instructions reviewed, and sits independently.

## 2018-08-04 NOTE — Patient Instructions (Signed)
You received an epidural steroid injection under fluoroscopic guidance. This is the most accurate way to perform an epidural injection. This injection was performed to relieve thigh or leg or foot pain that may be related to a pinched nerve in the lumbar spine. The local anesthetic injected today may cause numbness in your leg for a couple hours. If it is severe we may need to observe you for 30-60 minutes after the injection. The cortisone medicine injected today may take several days to take full effect. This medicine can also cause facial flushing or feeling of being warm.  This injection may last for days weeks or months. It can be repeated if needed. If it is not effective, another spinal level may need to be injected. Other treatments include medication management as well as physical therapy. In some cases surgery may be an option.  

## 2018-08-06 ENCOUNTER — Telehealth (INDEPENDENT_AMBULATORY_CARE_PROVIDER_SITE_OTHER): Payer: Medicare Other | Admitting: Cardiovascular Disease

## 2018-08-06 ENCOUNTER — Encounter: Payer: Self-pay | Admitting: Cardiovascular Disease

## 2018-08-06 ENCOUNTER — Other Ambulatory Visit: Payer: Self-pay

## 2018-08-06 VITALS — BP 137/68 | HR 72 | Ht 65.0 in | Wt 187.0 lb

## 2018-08-06 DIAGNOSIS — I2510 Atherosclerotic heart disease of native coronary artery without angina pectoris: Secondary | ICD-10-CM

## 2018-08-06 NOTE — Patient Instructions (Addendum)
Medication Instructions:   If you need a refill on your cardiac medications before your next appointment, please call your pharmacy.   Lab work:  If you have labs (blood work) drawn today and your tests are completely normal, you will receive your results only by: Marland Kitchen MyChart Message (if you have MyChart) OR . A paper copy in the mail If you have any lab test that is abnormal or we need to change your treatment, we will call you to review the results.  Testing/Procedures: None ordered today.  Follow-Up: At [facility], you and your health needs are our priority.  As part of our continuing mission to provide you with exceptional heart care, we have created designated Provider Care Teams.  These Care Teams include your primary Cardiologist (physician) and Advanced Practice Providers (APPs -  Physician Assistants and Nurse Practitioners) who all work together to provide you with the care you need, when you need it. You will need a follow up appointment in 3  months.  You may see [name], MD or one of the following Advanced Practice Providers on your designated Care Team:   Truitt Merle, NP Cecilie Kicks, NP . Kathyrn Drown, NP

## 2018-08-14 ENCOUNTER — Ambulatory Visit: Payer: Medicare Other | Admitting: Hematology

## 2018-08-14 ENCOUNTER — Other Ambulatory Visit: Payer: Medicare Other

## 2018-08-14 NOTE — Progress Notes (Signed)
[place]   [phone] [phone]   Clinic Follow up Note   Patient Care Team: Seward Carol, MD as PCP - General (Internal Medicine) Josue Hector, MD as PCP - Cardiology (Cardiology) Thelma Comp, [place] as Consulting Physician (Optometry) Stark Klein, MD as Consulting Physician (General Surgery) Truitt Merle, MD as Consulting Physician (Hematology) Madelon Lips, MD as Consulting Physician (Nephrology)  Date of Service:  08/18/2018  CHIEF COMPLAINT: Follow up of right breast cancer and anemia of chronic disease   SUMMARY OF ONCOLOGIC HISTORY: Oncology History   Cancer Staging Breast cancer of lower-inner quadrant of right female breast [facility]) Staging form: Breast, AJCC 7th Edition - Clinical: Stage IA (T1b, N0, cM0) - Unsigned Staging comments: Staged at breast conference 03/25/13.  - Pathologic stage from 05/12/2013: Stage IA (T1b, N0, cM0) - Signed by Truitt Merle, MD on 06/21/2016       Breast cancer of lower-inner quadrant of right female breast ([place])   02/27/2013 Mammogram    Diagnostic mammogram and ultrasound showed a 6 x 5 mm mass at 5:00 position. Axilla was negative by ultrasound.    03/16/2013 Initial Biopsy    Right breast 5:00 position biopsy showed invasive ductal carcinoma, low-grade    03/16/2013 Receptors her2    ER100% positive, PR 100% positive, HER-2 negative, Ki-67 20%    03/18/2013 Initial Diagnosis    Breast cancer of lower-inner quadrant of right female breast ([place])    05/12/2013 Surgery    Right breast lumpectomy with sentinel lymph node biopsy    05/12/2013 Pathology Results    Right breast lumpectomy showed invasive ductal carcinoma, grade 2, 0.9 cm, 6 sentinel lymph nodes were all negative. Margins were negative.    05/12/2013 Oncotype testing    Oncotype recurrence score 19, intermediate risk, 10 year risk of distant recurrence 12% with tamoxifen for 5 years. Patient declined adjuvant chemotherapy.    06/02/2013 - 07/16/2013 Radiation Therapy    Adjuvant breast radiation with boost 1600Gy    08/06/2013 - 07/2017 Anti-estrogen oral therapy    Arimidex 1 mg daily. Stopped in 07/2017 due to acute illness     03/22/2017 Mammogram    IMPRESSION: No mammographic evidence for malignancy.      CURRENT THERAPY:  Surveillance   INTERVAL HISTORY:  [name] is here for a follow up of right breast cancer and anemia. She presents to the clinic alone. She notes she is doing well. She is staying home most of the time now. She is retired. She completed anastrozole in may and noticed no difference by coming off. She notes she is seeing a nephrologist.  She notes when walking or doing activities or even talking on the phone she can become SOB. She notes someone did anemia workup for her by Dr Delfina Redwood. She notes she sees Dr polite every 3 months. She saw nephrologist Dr Hollie Salk 4 weeks ago. I reviewed her medication list with her.   She notes for the past 1-2 months she has been having night sweats almost every night and have to change clothes. She denies fever or chills. She notes she is eating adequately. She notes she held some time and had returned to skin itching. She is now back on medication.    REVIEW OF SYSTEMS:   Constitutional: Denies fevers, chills or abnormal weight loss (+) fatigue (+) hot flashes  Eyes: Denies blurriness of vision Ears, nose, mouth, throat, and face: Denies mucositis or sore throat  Respiratory: (+) SOB  upon mild exertion  Cardiovascular: Denies palpitation, chest discomfort or lower extremity swelling Gastrointestinal:  Denies nausea, heartburn or change in bowel habits Skin: Denies abnormal skin rashes Lymphatics: Denies new lymphadenopathy or easy bruising Neurological:Denies numbness, tingling or new weaknesses Behavioral/Psych: Mood is stable, no new changes  All other systems were reviewed with the patient and are negative.  MEDICAL HISTORY:  Past Medical  History:  Diagnosis Date  . Abdominal discomfort    Chronic N/V/D. Presumptive dx Crohn's dx per elevated p ANCA. Failed Entocort and Pentasa. Sep 2003 - ileocolectomy c anastomosis per Dr Bowman 2/2 adhesions - path was hegative for Crohns. EGD, Sm bowel follow through (11/03), and an eteroclysis (10/03) were unrevealing. Cuases hypomag and hypocalcemia.  . Adnexal mass 8/03   s/p lap BSO (R ovarian fibroma) & lysis of adhesions  . Allergy    Seasonal  . Anemia    Multifactorial. Baseline HgB 10-11 ish. B12 def - 150 in 3/10. Fe Def - ferritin 35 3/10. Both are being repleted.  . Breast cancer (HCC) 03/16/13   right, 5 o'clock  . CAD (coronary artery disease) 1996   1996 - PTCA and angioplasty diagonal branch. 2000 - Rotoblator & angiopllasty of diagonal. 2006 - subendocardial AMI, DES to proximal LAD.. Also had 90% stenosis in distal apical LAD. EF 55 with apical hypokinesis. Indefinite ASA and Plavix.  . CHF (congestive heart failure) (HCC)   . Chronic kidney disease    Chronic renal insuff baseline Cr 1.2 - 1.4 ish.  . Chronic pain    CT 10/10 = Spinal stenosis L2 - S1.  . Diabetes mellitus    Insulin dependent  . Hx of radiation therapy 06/02/13- 07/16/13   right rbeast 4500 cGy 25 sessions, right breast boost 1600 cGy in 8 sessions  . Hyperlipidemia    Managed with both a statin and Welchol. Welchol stopped 2014 2/2 cost and started on fenofibrate   . Hypertension    2006 B renal arteries patent. 2003 MRA - no RAS. 2003 pheo W/U Dr Fox reportedly negative.  . Hypoxia 07/23/2017  . Lupus (HCC)   . Personal history of radiation therapy   . RBBB   . SBO (small bowel obstruction) (HCC) 09/17/2017  . Stroke (HCC)    Incidental finding MRI 2002 L lacunar infarct  . Wears dentures    top    SURGICAL HISTORY: Past Surgical History:  Procedure Laterality Date  . ABDOMINAL HYSTERECTOMY    . BILATERAL SALPINGOOPHORECTOMY  8/03   Lap BSO (R ovarian fibroma) and adhesion lysis  .  BOWEL RESECTION  2003   ileocolectomy with anastomosis 2/2 adhesions  . BREAST BIOPSY    . BREAST LUMPECTOMY Right   . BREAST LUMPECTOMY WITH NEEDLE LOCALIZATION AND AXILLARY SENTINEL LYMPH NODE BX Right 04/22/2013   Procedure: BREAST LUMPECTOMY WITH NEEDLE LOCALIZATION AND AXILLARY SENTINEL LYMPH NODE BX;  Surgeon: Faera Byerly, MD;  Location: [facility];  Service: General;  Laterality: Right;  . CARDIAC CATHETERIZATION     2 stents  . CHOLECYSTECTOMY    . COLONOSCOPY    . HEMICOLECTOMY     R sided hemicolectomy  . HERNIA REPAIR     Ventral hernia repair  . PTCA  4/06    I have reviewed the social history and family history with the patient and they are unchanged from previous note.  ALLERGIES:  is allergic to percocet [oxycodone-acetaminophen]; diazepam; haloperidol lactate; lorazepam; morphine sulfate; propoxyphene hcl; tramadol hcl; anesthetics, halogenated; etomidate;   fentanyl; and versed [midazolam].  MEDICATIONS:  Current Outpatient Medications  Medication Sig Dispense Refill  . acetaminophen (TYLENOL) 325 MG tablet Take 650 mg by mouth every 6 (six) hours as needed for mild pain.    . amLODipine (NORVASC) 10 MG tablet Take 1 tablet (10 mg total) by mouth daily. 30 tablet 0  . atorvastatin (LIPITOR) 40 MG tablet Take 1 tablet (40 mg total) by mouth at bedtime. 90 tablet 3  . carvedilol (COREG) 25 MG tablet Take 25 mg by mouth as directed.    . desloratadine (CLARINEX) 5 MG tablet Take 5 mg by mouth at bedtime as needed (allergies). Reported on 04/11/2015    . fenofibrate 54 MG tablet Take 54 mg by mouth daily.     . ferrous fumarate (HEMOCYTE - 106 MG FE) 325 (106 Fe) MG TABS tablet Take 1 tablet by mouth as directed.    . furosemide (LASIX) 40 MG tablet Take 40 mg by mouth as directed.    . gabapentin (NEURONTIN) 100 MG capsule Take 1 capsule (100 mg total) by mouth 4 (four) times daily. 120 capsule 3  . hydrALAZINE (APRESOLINE) 10 MG tablet Take 10 mg by mouth as  directed.    . linagliptin (TRADJENTA) 5 MG TABS tablet Take 5 mg by mouth daily.    . nitroGLYCERIN (NITROSTAT) 0.4 MG SL tablet Place 1 tablet (0.4 mg total) under the tongue every 5 (five) minutes as needed for chest pain. 25 tablet 3  . pantoprazole (PROTONIX) 40 MG tablet TAKE 1 TABLET BY MOUTH ONCE DAILY 30 tablet 3  . potassium chloride SA (K-DUR,KLOR-CON) 20 MEQ tablet Take 1 tablet (20 mEq total) by mouth daily as needed (when taking lasix). 30 tablet 1  . ULORIC 40 MG tablet Take 1 tablet by mouth daily.     No current facility-administered medications for this visit.     PHYSICAL EXAMINATION: ECOG PERFORMANCE STATUS: 1 - Symptomatic but completely ambulatory  Vitals:   08/18/18 1426  BP: (!) 155/68  Pulse: 80  Resp: 18  Temp: 98.7 F (37.1 C)  SpO2: 100%   Filed Weights   08/18/18 1426  Weight: 200 lb (90.7 kg)    GENERAL:alert, no distress and comfortable SKIN: skin color, texture, turgor are normal, no rashes or significant lesions EYES: normal, Conjunctiva are pink and non-injected, sclera clear  NECK: supple, thyroid normal size, non-tender, without nodularity LYMPH:  no palpable lymphadenopathy in the cervical, axillary, inguinal  LUNGS: clear to auscultation and percussion with normal breathing effort HEART: regular rate & rhythm and no murmurs and no lower extremity edema ABDOMEN:abdomen soft, non-tender and normal bowel sounds Musculoskeletal:no cyanosis of digits and no clubbing  NEURO: alert & oriented x 3 with fluent speech, no focal motor/sensory deficits BREAST: S/p right breast lumpectomy; surgical incision healed well with inferior breast tissue firmness, especially in right lateral lower quadrant, without discrete mass. Left breast exam showed no palpable mass, no axillary adenopathy   LABORATORY DATA:  I have reviewed the data as listed CBC Latest Ref Rng & Units 08/18/2018 02/05/2018 10/25/2017  WBC 4.0 - 10.5 K/uL 9.9 16.4(H) 9.3  Hemoglobin 12.0 -  15.0 g/dL 8.5(L) 8.9(L) 10.2(L)  Hematocrit 36.0 - 46.0 % 26.9(L) 28.8(L) 30.8(L)  Platelets 150 - 400 K/uL 312 342 411.0(H)     CMP Latest Ref Rng & Units 08/18/2018 02/05/2018 10/25/2017  Glucose 70 - 99 mg/dL 199(H) 214(H) 182(H)  BUN 8 - 23 mg/dL 58(H) 65(H) 52(H)  Creatinine 0.44 -   1.00 mg/dL 2.83(H) 2.12(H) 2.20(H)  Sodium 135 - 145 mmol/L 139 138 140  Potassium 3.5 - 5.1 mmol/L 4.5 4.1 4.7  Chloride 98 - 111 mmol/L 105 105 103  CO2 22 - 32 mmol/L 25 21(L) 30  Calcium 8.9 - 10.3 mg/dL 9.1 9.5 10.3  Total Protein 6.5 - 8.1 g/dL 7.7 8.8(H) -  Total Bilirubin 0.3 - 1.2 mg/dL 0.3 0.2(L) -  Alkaline Phos 38 - 126 U/L 70 108 -  AST 15 - 41 U/L 13(L) 19 -  ALT 0 - 44 U/L 16 18 -      RADIOGRAPHIC STUDIES: I have personally reviewed the radiological images as listed and agreed with the findings in the report. No results found.   ASSESSMENT & PLAN:  [name] is a 73 y.o. female with   1. Breast cancer of the lower inner quadrant of the right breast, invasive ductal carcinoma with DCIS, pT1bN0Mo, stage IA, ER+ and PR +, HER 2 negative -She was diagnosed in 02/2013 and treated with right lumpectomy, adjuvant radiation and Anastrozole for 4 years until 07/2017.  Anastrozole was stopped after she developed septic shock from pneumonia and a cardiac arrest, and we decided not restarted due to the risk of cardiac toxicity. -From a breast cancer standpoint she is clinically doing well. Lab reviewed, her CBC and CMP are WNL except anemia with Hg 8.5, and Cr 2.83. Vitamin D and Iron panel is still pending. Her physical exam and her 03/2018 mammogram were unremarkable. There is no clinical concern for recurrence. -She is almost 6 years out since diagnosis. Continue breast cancer surveillance, Next mammogram in 03/2019 -f/u in 2-3 weeks after bone marrow biopsy    2. Anemia of chronic disease (CKD), Iron and B12 deficiency -Patient receives a B12 shot every month at PCP's office -I  will consider iv iron injections if her iron level does not improve. -Her 04/2018 Colonoscopy with Dr Gessner showed no evidence of GI bleeding or source of bleeding.  -I discussed her anemia is probably related to her CKD, however her anemia is out of proportion of her stage IV CKD related anemia. She has been having night sweats and skin itching as well lately.    -I recommend she do bone marrow biopsy to rule out MDS and other primary bone marrow disease. She is agreeable.  -Latest Hg at 8.5 (08/18/18), no need for blood transfusion. Iron panel is still pending. I discussed starting EPO (Aranesp) monthly and can increase frequency. She is agreeable. Plan to start in 08/2018.  -Continue OTC iron pill ferrous sulfate 1-2 tab daily for iron deficiency   3. Low Back pain, from degenerative arthritis and spinal stenosis - f/u with Orthopedics  -She is currently getting injections with orthopedist   4. DM  -managedby PCP -Currently on L[place], tradjentra -Latest Glucose at 199 today (08/18/18)   5. Chronic renal insufficieny, CKD stage IV -She follows up with Dr. [name] for her kidney function.  -She knows to avoid nephrotoxins -Latest Cr at 2.12, BUN 65, GFR at 26. Based on these results I discussed she is stage IV CKD.  -Her BP and latest glucose has been evaluated. I strongly encouraged her to control this as this will negatively impact her kidney function.   6. CAD, HTN -Continue to follow up with Dr. [name] -BP at 155/68 today (08/18/18), stable.   7. Genetics, results negative   8. Bone Health  -DEXA from 04/2018 shows slightly improved normal bone density with lowest   T-score of 0.9 at forearm radius -She will continue vitamin D supplement. Her vitamin D level is still pending today (08/18/18)  9. Left Renal Vein Thrombosis in 07/2017 -Likely provoked from acute illness and prior SBO -She stopped Xarelto in 01/2018 -Her hypercoagulopathy work-up in November 2019 was negative, I  reviewed with her   10. History of pneumonia and cardiac arrest in 07/2017 -10/16/17 CXR showed residual peribronchial disease, atelectasis, scarring or potentially pulmonary nodules. -Her latest CT chest from 01/27/18 shows No acute findings. Scarring in the left upper lobe.   PLAN -Bone marrow biopsy at [facility] with Mendel Ryder next week -F/u and Aranesp injection in 2-3 weeks    No problem-specific Assessment & Plan notes found for this encounter.   No orders of the defined types were placed in this encounter.  All questions were answered. The patient knows to call the clinic with any problems, questions or concerns. No barriers to learning was detected. I spent 20 minutes counseling the patient face to face. The total time spent in the appointment was 25 minutes and more than 50% was on counseling and review of test results     Truitt Merle, MD 08/18/2018   I, Joslyn Devon, am acting as scribe for Truitt Merle, MD.   I have reviewed the above documentation for accuracy and completeness, and I agree with the above.  Addendum  Her ferritin today was 46, will schedule one dose iv feraheme in next few weeks.   Truitt Merle

## 2018-08-18 ENCOUNTER — Telehealth: Payer: Self-pay | Admitting: Cardiovascular Disease

## 2018-08-18 ENCOUNTER — Inpatient Hospital Stay (HOSPITAL_BASED_OUTPATIENT_CLINIC_OR_DEPARTMENT_OTHER): Payer: Medicare Other | Admitting: Hematology

## 2018-08-18 ENCOUNTER — Other Ambulatory Visit: Payer: Self-pay

## 2018-08-18 ENCOUNTER — Inpatient Hospital Stay: Payer: Medicare Other | Attending: Internal Medicine

## 2018-08-18 VITALS — BP 155/68 | HR 80 | Temp 98.7°F | Resp 18 | Ht 65.0 in | Wt 200.0 lb

## 2018-08-18 DIAGNOSIS — N183 Chronic kidney disease, stage 3 unspecified: Secondary | ICD-10-CM

## 2018-08-18 DIAGNOSIS — N184 Chronic kidney disease, stage 4 (severe): Secondary | ICD-10-CM | POA: Insufficient documentation

## 2018-08-18 DIAGNOSIS — I1310 Hypertensive heart and chronic kidney disease without heart failure, with stage 1 through stage 4 chronic kidney disease, or unspecified chronic kidney disease: Secondary | ICD-10-CM

## 2018-08-18 DIAGNOSIS — E1122 Type 2 diabetes mellitus with diabetic chronic kidney disease: Secondary | ICD-10-CM | POA: Diagnosis not present

## 2018-08-18 DIAGNOSIS — D509 Iron deficiency anemia, unspecified: Secondary | ICD-10-CM | POA: Diagnosis not present

## 2018-08-18 DIAGNOSIS — I2510 Atherosclerotic heart disease of native coronary artery without angina pectoris: Secondary | ICD-10-CM | POA: Insufficient documentation

## 2018-08-18 DIAGNOSIS — Z170 Estrogen receptor positive status [ER+]: Secondary | ICD-10-CM

## 2018-08-18 DIAGNOSIS — Z79899 Other long term (current) drug therapy: Secondary | ICD-10-CM | POA: Insufficient documentation

## 2018-08-18 DIAGNOSIS — D508 Other iron deficiency anemias: Secondary | ICD-10-CM

## 2018-08-18 DIAGNOSIS — E538 Deficiency of other specified B group vitamins: Secondary | ICD-10-CM

## 2018-08-18 DIAGNOSIS — Z853 Personal history of malignant neoplasm of breast: Secondary | ICD-10-CM | POA: Diagnosis not present

## 2018-08-18 DIAGNOSIS — R61 Generalized hyperhidrosis: Secondary | ICD-10-CM

## 2018-08-18 DIAGNOSIS — D638 Anemia in other chronic diseases classified elsewhere: Secondary | ICD-10-CM | POA: Diagnosis not present

## 2018-08-18 DIAGNOSIS — C50311 Malignant neoplasm of lower-inner quadrant of right female breast: Secondary | ICD-10-CM

## 2018-08-18 DIAGNOSIS — R0602 Shortness of breath: Secondary | ICD-10-CM

## 2018-08-18 DIAGNOSIS — D631 Anemia in chronic kidney disease: Secondary | ICD-10-CM

## 2018-08-18 DIAGNOSIS — Z8674 Personal history of sudden cardiac arrest: Secondary | ICD-10-CM

## 2018-08-18 LAB — CBC WITH DIFFERENTIAL/PLATELET
Abs Immature Granulocytes: 0.1 10*3/uL — ABNORMAL HIGH (ref 0.00–0.07)
Basophils Absolute: 0 10*3/uL (ref 0.0–0.1)
Basophils Relative: 0 %
Eosinophils Absolute: 0.1 10*3/uL (ref 0.0–0.5)
Eosinophils Relative: 1 %
HCT: 26.9 % — ABNORMAL LOW (ref 36.0–46.0)
Hemoglobin: 8.5 g/dL — ABNORMAL LOW (ref 12.0–15.0)
Immature Granulocytes: 1 %
Lymphocytes Relative: 17 %
Lymphs Abs: 1.7 10*3/uL (ref 0.7–4.0)
MCH: 26.4 pg (ref 26.0–34.0)
MCHC: 31.6 g/dL (ref 30.0–36.0)
MCV: 83.5 fL (ref 80.0–100.0)
Monocytes Absolute: 0.5 10*3/uL (ref 0.1–1.0)
Monocytes Relative: 5 %
Neutro Abs: 7.5 10*3/uL (ref 1.7–7.7)
Neutrophils Relative %: 76 %
Platelets: 312 10*3/uL (ref 150–400)
RBC: 3.22 MIL/uL — ABNORMAL LOW (ref 3.87–5.11)
RDW: 15 % (ref 11.5–15.5)
WBC: 9.9 10*3/uL (ref 4.0–10.5)
nRBC: 0 % (ref 0.0–0.2)

## 2018-08-18 LAB — IRON AND TIBC
Iron: 55 ug/dL (ref 41–142)
Saturation Ratios: 13 % — ABNORMAL LOW (ref 21–57)
TIBC: 424 ug/dL (ref 236–444)
UIBC: 369 ug/dL (ref 120–384)

## 2018-08-18 LAB — COMPREHENSIVE METABOLIC PANEL
ALT: 16 U/L (ref 0–44)
AST: 13 U/L — ABNORMAL LOW (ref 15–41)
Albumin: 3.5 g/dL (ref 3.5–5.0)
Alkaline Phosphatase: 70 U/L (ref 38–126)
Anion gap: 9 (ref 5–15)
BUN: 58 mg/dL — ABNORMAL HIGH (ref 8–23)
CO2: 25 mmol/L (ref 22–32)
Calcium: 9.1 mg/dL (ref 8.9–10.3)
Chloride: 105 mmol/L (ref 98–111)
Creatinine, Ser: 2.83 mg/dL — ABNORMAL HIGH (ref 0.44–1.00)
GFR calc Af Amer: 19 mL/min — ABNORMAL LOW (ref >60)
GFR calc non Af Amer: 16 mL/min — ABNORMAL LOW (ref >60)
Glucose, Bld: 199 mg/dL — ABNORMAL HIGH (ref 70–99)
Potassium: 4.5 mmol/L (ref 3.5–5.1)
Sodium: 139 mmol/L (ref 135–145)
Total Bilirubin: 0.3 mg/dL (ref 0.3–1.2)
Total Protein: 7.7 g/dL (ref 6.5–8.1)

## 2018-08-18 LAB — FERRITIN: Ferritin: 46 ng/mL (ref 11–307)

## 2018-08-18 NOTE — Telephone Encounter (Signed)
[name] (DPR) called about patient having SOB and BLE edema. Patient complaining of having gained weight (current wt 200 lbs), BLE edema, and SOB. [name] stated she does not think her mother is taking her lasix 40 mg daily. [name] is not with her mother at this time, so limited information.  Informed patient's daughter to have patient take lasix and see if this helps. Encouraged her to take her mother to ED if SOB does not improve. Patient recently had Virtual visit with Dr. Johnsie Cancel and saw her oncologist today. Will forward to Dr. Johnsie Cancel for further advisement.

## 2018-08-18 NOTE — Telephone Encounter (Signed)
New Message              Pt c/o swelling: STAT is pt has developed SOB within 24 hours  1) How much weight have you gained and in what time span? Daughter not sure how much but she weighs 200pds  2) If swelling, where is the swelling located?LE  3) Are you currently taking a fluid pill? No  4) Are you currently SOB? Yes   5) Do you have a log of your daily weights (if so, list)? No  6) Have you gained 3 pounds in a day or 5 pounds in a week? Yes  7) Have you traveled recently? No

## 2018-08-19 ENCOUNTER — Telehealth: Payer: Self-pay

## 2018-08-19 ENCOUNTER — Encounter: Payer: Self-pay | Admitting: Hematology

## 2018-08-19 ENCOUNTER — Other Ambulatory Visit: Payer: Self-pay | Admitting: Hematology

## 2018-08-19 ENCOUNTER — Telehealth: Payer: Self-pay | Admitting: Hematology

## 2018-08-19 DIAGNOSIS — D509 Iron deficiency anemia, unspecified: Secondary | ICD-10-CM | POA: Insufficient documentation

## 2018-08-19 LAB — VITAMIN D 25 HYDROXY (VIT D DEFICIENCY, FRACTURES): Vit D, 25-Hydroxy: 14 ng/mL — ABNORMAL LOW (ref 30.0–100.0)

## 2018-08-19 NOTE — Telephone Encounter (Signed)
Follow up  ° ° °Pt is returning call  ° ° °Please call back  °

## 2018-08-19 NOTE — Telephone Encounter (Signed)
Spoke with patient regarding lab results, per Dr. Burr Medico notified her iron level on low end of normal.  She would like her to get IV iron infusion when she comes in on 6/22.  The patient verbalized an understanding and agreed with the plan.

## 2018-08-19 NOTE — Telephone Encounter (Signed)
Nothing to add take the lasix

## 2018-08-19 NOTE — Telephone Encounter (Signed)
Called and spoke with patient. Patient stated she has been taking Lasix 40 mg by mouth daily. Patient wanted to know if she should take more. Patient stated that her BLE edema and SOB are a little bit better today, because she has not done a lot of moving around. Encouraged patient to keep a low salt diet, which she stated she has been. Will see if Dr. Johnsie Cancel wants to increase Lasix or get any lab work.

## 2018-08-19 NOTE — Telephone Encounter (Signed)
Scheduled appt per 6/1 los.  Left a voice message of appt date and time.

## 2018-08-20 ENCOUNTER — Other Ambulatory Visit: Payer: Self-pay | Admitting: *Deleted

## 2018-08-20 ENCOUNTER — Telehealth: Payer: Self-pay | Admitting: *Deleted

## 2018-08-20 MED ORDER — ERGOCALCIFEROL 1.25 MG (50000 UT) PO CAPS: 50000.0000 [IU] | ORAL_CAPSULE | ORAL | 0 refills | Status: DC

## 2018-08-20 NOTE — Telephone Encounter (Signed)
-----   Message from Truitt Merle, MD sent at 08/19/2018  6:17 PM EDT ----- Please let pt know her VitD level is very low, if she agrees, please call in vitD 50000 unit weekly X8, then OTC VitD 1000u daily, thanks   Truitt Merle  08/19/2018

## 2018-08-20 NOTE — Telephone Encounter (Signed)
Called pt per Dr Ernestina Penna message & informed of low Vit D.  She is willing to take Vit D & will call in 50000 dose to take weekly x 8 wks & then got to over the counter dose of 1000 u daily.  Script sent in.

## 2018-08-20 NOTE — Telephone Encounter (Signed)
Dr. Johnsie Cancel advised patient see FLEX in office so medications can be review and lab work can be collected. Patient agreed to plan. Patient just had lab work on 08/18/18, but might need additional labs. Will have provider make that assessment.

## 2018-08-20 NOTE — Telephone Encounter (Signed)
Previous replay - should be taking lasix

## 2018-08-21 ENCOUNTER — Telehealth: Payer: Self-pay | Admitting: *Deleted

## 2018-08-21 NOTE — Telephone Encounter (Signed)
Call placed to pt re: appt 08/22/2018, covid prescreening: pts answers NO all.      COVID-19 Pre-Screening Questions:  . In the past 7 to 10 days have you had a cough,  shortness of breath, headache, congestion, fever (100 or greater) body aches, chills, sore throat, or sudden loss of taste or sense of smell? . Have you been around anyone with known Covid 19. . Have you been around anyone who is awaiting Covid 19 test results in the past 7 to 10 days? . Have you been around anyone who has been exposed to Covid 19, or has mentioned symptoms of Covid 19 within the past 7 to 10 days?  If you have any concerns/questions about symptoms patients report during screening (either on the phone or at threshold). Contact the provider seeing the patient or DOD for further guidance.  If neither are available contact a member of the leadership team.

## 2018-08-22 ENCOUNTER — Other Ambulatory Visit: Payer: Self-pay

## 2018-08-22 ENCOUNTER — Encounter: Payer: Self-pay | Admitting: Cardiology

## 2018-08-22 ENCOUNTER — Ambulatory Visit (INDEPENDENT_AMBULATORY_CARE_PROVIDER_SITE_OTHER): Payer: Medicare Other | Admitting: Cardiology

## 2018-08-22 VITALS — BP 132/60 | HR 79 | Ht 65.0 in | Wt 198.4 lb

## 2018-08-22 DIAGNOSIS — I2510 Atherosclerotic heart disease of native coronary artery without angina pectoris: Secondary | ICD-10-CM | POA: Diagnosis not present

## 2018-08-22 DIAGNOSIS — R0602 Shortness of breath: Secondary | ICD-10-CM

## 2018-08-22 DIAGNOSIS — R600 Localized edema: Secondary | ICD-10-CM

## 2018-08-22 DIAGNOSIS — D509 Iron deficiency anemia, unspecified: Secondary | ICD-10-CM | POA: Diagnosis not present

## 2018-08-22 MED ORDER — NITROGLYCERIN 0.4 MG SL SUBL: 0.4000 mg | SUBLINGUAL_TABLET | SUBLINGUAL | 3 refills | Status: DC | PRN

## 2018-08-22 NOTE — Progress Notes (Signed)
Cardiology Office Note   Date:  08/23/2018   ID:  [name], DOB 08-16-1945, MRN [id number]  PCP:  Seward Carol, MD  Cardiologist:  Dr. Johnsie Cancel     Chief Complaint  Patient presents with  . Shortness of Breath  . Leg Swelling      History of Present Illness: [name] is a 73 y.o. female who presents for SOB and BLE edema.  Wt gain.   She was placed back on lasix 40 mg daily and is here for eval.   CAD with pevious rotoblator to the LAD in 2000. Her last myovue on 11/2006 showed mild apical thinning and was low risk. She has not had any SSCP and has been compliant with her meds.Her BP is alwyas hard to Rx and she is on multiple drugs with persistant mild tachycardia. She denies dyspnea, PND orthopnea. Has some swelling in her feet. Diet still an issue with her DM.   June admitted with sepsis syndrome pneumonia She had alveolar hemorrhage and brief PEA Arrest post bronchoscopy and was intubated for days and d/c to inpatient rehab   Breast cancer and sees Dr Burr Medico was on Arimidex but hospitalized in July with SBO pneumonia and pancreatic cyst. Noted to have renal vein thrombosis and placed on xarelto For 6 months   Last TTE done 07/27/17 reviewed EF 55-60% Moderate MR  Estimated PA pressure 64 mmHg  She has some pain in chest with activity and after eating Not severe and only lasts minutes Nitro gives her headache and "she hasn't needed it"  She is anemic with Hgb of 8.5  Iron level 55 Vitd 14  Her Cr is climbing 2.83  In July 2019 was 1.95   Last labs August 18 2018  She has had increased SOB and lower ext edema.  She has had some palpitations.  She is up frequently to go void.  She has had to prop up on pillows.  Wt is up 10 lbs.     Past Medical History:  Diagnosis Date  . Abdominal discomfort    Chronic N/V/D. Presumptive dx Crohn's dx per elevated p ANCA. Failed Entocort and Pentasa. Sep 2003 - ileocolectomy c anastomosis per Dr Deon Pilling 2/2 adhesions -  path was hegative for Crohns. EGD, Sm bowel follow through (11/03), and an eteroclysis (10/03) were unrevealing. Cuases hypomag and hypocalcemia.  . Adnexal mass 8/03   s/p lap BSO (R ovarian fibroma) & lysis of adhesions  . Allergy    Seasonal  . Anemia    Multifactorial. Baseline HgB 10-11 ish. B12 def - 150 in 3/10. Fe Def - ferritin 35 3/10. Both are being repleted.  . Breast cancer ([place]) 03/16/13   right, 5 o'clock  . CAD (coronary artery disease) 1996   1996 - PTCA and angioplasty diagonal branch. 2000 - Rotoblator & angiopllasty of diagonal. 2006 - subendocardial AMI, DES to proximal LAD.Marland Kitchen Also had 90% stenosis in distal apical LAD. EF 55 with apical hypokinesis. Indefinite ASA and Plavix.  . CHF (congestive heart failure) ([place])   . Chronic kidney disease    Chronic renal insuff baseline Cr 1.2 - 1.4 ish.  . Chronic pain    CT 10/10 = Spinal stenosis L2 - S1.  . Diabetes mellitus    Insulin dependent  . Hx of radiation therapy 06/02/13- 07/16/13   right rbeast 4500 cGy 25 sessions, right breast boost 1600 cGy in 8 sessions  . Hyperlipidemia    Managed with both a  statin and Welchol. Welchol stopped 2014 2/2 cost and started on fenofibrate   . Hypertension    2006 B renal arteries patent. 2003 MRA - no RAS. 2003 pheo W/U Dr Hassell Done reportedly negative.  . Hypoxia 07/23/2017  . Lupus ([place])   . Personal history of radiation therapy   . RBBB   . SBO (small bowel obstruction) ([place]) 09/17/2017  . Stroke [facility])    Incidental finding MRI 2002 L lacunar infarct  . Wears dentures    top    Past Surgical History:  Procedure Laterality Date  . ABDOMINAL HYSTERECTOMY    . BILATERAL SALPINGOOPHORECTOMY  8/03   Lap BSO (R ovarian fibroma) and adhesion lysis  . BOWEL RESECTION  2003   ileocolectomy with anastomosis 2/2 adhesions  . BREAST BIOPSY    . BREAST LUMPECTOMY Right   . BREAST LUMPECTOMY WITH NEEDLE LOCALIZATION AND AXILLARY SENTINEL LYMPH NODE BX Right 04/22/2013   Procedure:  BREAST LUMPECTOMY WITH NEEDLE LOCALIZATION AND AXILLARY SENTINEL LYMPH NODE BX;  Surgeon: Stark Klein, MD;  Location: [place];  Service: General;  Laterality: Right;  . CARDIAC CATHETERIZATION     2 stents  . CHOLECYSTECTOMY    . COLONOSCOPY    . HEMICOLECTOMY     R sided hemicolectomy  . HERNIA REPAIR     Ventral hernia repair  . PTCA  4/06     Current Outpatient Medications  Medication Sig Dispense Refill  . acetaminophen (TYLENOL) 325 MG tablet Take 650 mg by mouth every 6 (six) hours as needed for mild pain.    Marland Kitchen amLODipine (NORVASC) 10 MG tablet Take 1 tablet (10 mg total) by mouth daily. 30 tablet 0  . atorvastatin (LIPITOR) 40 MG tablet Take 1 tablet (40 mg total) by mouth at bedtime. 90 tablet 3  . carvedilol (COREG) 25 MG tablet Take 25 mg by mouth as directed.    . desloratadine (CLARINEX) 5 MG tablet Take 5 mg by mouth at bedtime as needed (allergies). Reported on 04/11/2015    . ergocalciferol (VITAMIN D2) 1.25 MG (50000 UT) capsule Take 1 capsule (50,000 Units total) by mouth once a week. X 8 wks & then go to OTC dose of 1000 units daily. 8 capsule 0  . fenofibrate 54 MG tablet Take 54 mg by mouth daily.     . ferrous fumarate (HEMOCYTE - 106 MG FE) 325 (106 Fe) MG TABS tablet Take 1 tablet by mouth as directed.    . furosemide (LASIX) 40 MG tablet Take 40 mg by mouth as directed.    . gabapentin (NEURONTIN) 100 MG capsule Take 1 capsule (100 mg total) by mouth 4 (four) times daily. 120 capsule 3  . hydrALAZINE (APRESOLINE) 10 MG tablet Take 10 mg by mouth as directed.    . linagliptin (TRADJENTA) 5 MG TABS tablet Take 5 mg by mouth daily.    . nitroGLYCERIN (NITROSTAT) 0.4 MG SL tablet Place 1 tablet (0.4 mg total) under the tongue every 5 (five) minutes as needed for chest pain. 25 tablet 3  . pantoprazole (PROTONIX) 40 MG tablet TAKE 1 TABLET BY MOUTH ONCE DAILY 30 tablet 3  . potassium chloride SA (K-DUR,KLOR-CON) 20 MEQ tablet Take 1 tablet (20 mEq  total) by mouth daily as needed (when taking lasix). 30 tablet 1  . ULORIC 40 MG tablet Take 1 tablet by mouth daily.     No current facility-administered medications for this visit.     Allergies:   Percocet [oxycodone-acetaminophen];  Diazepam; Haloperidol lactate; Lorazepam; Morphine sulfate; Propoxyphene hcl; Tramadol hcl; Anesthetics, halogenated; Etomidate; Fentanyl; and Versed [midazolam]    Social History:  The patient  reports that she has never smoked. She has never used smokeless tobacco. She reports that she does not drink alcohol or use drugs.   Family History:  The patient's family history includes Breast cancer in her maternal aunt and maternal grandmother; Breast cancer (age of onset: 60) in her mother; Clotting disorder in her maternal grandmother; Colon cancer in her maternal grandmother; Diabetes in her maternal grandmother; Heart attack in her maternal grandmother; Hypertension in her daughter; Kidney failure in her maternal aunt; Lung cancer in her maternal aunt; Pancreatic cancer (age of onset: 98) in her brother; Prostate cancer in her maternal uncle.    ROS:  General:no colds or fevers, no weight changes Skin:no rashes or ulcers HEENT:no blurred vision, no congestion CV:see HPI PUL:see HPI GI:no diarrhea constipation or melena, no indigestion GU:no hematuria, no dysuria MS:no joint pain, no claudication Neuro:no syncope, no lightheadedness Endo:no diabetes, no thyroid disease  Wt Readings from Last 3 Encounters:  08/22/18 198 lb 6.4 oz (90 kg)  08/18/18 200 lb (90.7 kg)  08/06/18 187 lb (84.8 kg)     PHYSICAL EXAM: VS:  BP 132/60   Pulse 79   Ht 5' 5"  (1.651 m)   Wt 198 lb 6.4 oz (90 kg)   SpO2 98%   BMI 33.02 kg/m  , BMI Body mass index is 33.02 kg/m. General:Pleasant affect, NAD Skin:Warm and dry, brisk capillary refill HEENT:normocephalic, sclera clear, mucus membranes moist Neck:supple, mild JVD, no bruits  Heart:S1S2 RRR with 2/6  murmur, no  gallup, rub or click Lungs:few rales, no rhonchi, or wheezes YIR:SWNI, non tender, + BS, do not palpate liver spleen or masses Ext:1+ lower ext edema, 2+ pedal pulses, 2+ radial pulses Neuro:alert and oriented x 3, Jonaya, follows commands, + facial symmetry    EKG:  EKG is ordered today. The ekg ordered today demonstrates SR RBBB and no changes from prior.   Recent Labs: 08/18/2018: ALT 16; BUN 58; Creatinine, Ser 2.83; Hemoglobin 8.5; Platelets 312; Potassium 4.5; Sodium 139    Lipid Panel    Component Value Date/Time   CHOL 183 07/21/2015 1106   TRIG 107 07/24/2017 1444   HDL 47 07/21/2015 1106   CHOLHDL 3.9 07/21/2015 1106   CHOLHDL 3.8 09/10/2013 1041   VLDL 56 (H) 09/10/2013 1041   LDLCALC 88 07/21/2015 1106   LDLDIRECT 55 07/01/2012 1016       Other studies Reviewed: Additional studies/ records that were reviewed today include: Nuc study 02/2018  Study Highlights   Addendum by Sueanne Margarita, MD on Sun Mar 02, 2018 7:58 PM   The left ventricular ejection fraction is moderately decreased (30-44%).  Nuclear stress EF: 37%.  There was no ST segment deviation noted during stress.  Study is of very poor quality due to evere extracardiac tracer uptake  Recommend repeat stress images or consider coronary CTA    Finalized by Sueanne Margarita, MD on Sun Mar 02, 2018 7:57 PM   The left ventricular ejection fraction is moderately decreased (30-44%).  Nuclear stress EF: 37%.  There was no ST segment deviation noted during stress.     Renal ultrasound  FINDINGS: Right Kidney:  10.8 x 5.5 x 4.9 cm (volume = 150 cm^3). No hydronephrosis. Parenchyma isoechoic to adjacent liver. No hydronephrosis. 2.1 cm upper pole parapelvic cyst. 1 cm lower pole cyst. Antegrade flow  signal in the right renal vein.  Left Kidney:  10.9 x 5.4 x 5.3 cm (volume = 160 cm^3). Mild pelvicaliectasis. 1.2 cm lower pole cyst. Antegrade flow signal in the left renal vein. No definite  filling defect to suggest residual thrombus. IMPRESSION: 1. No ultrasound evidence of hemodynamically significant renal artery stenosis. If there is continued clinical concern, renal MRA (lower radiation risk, can be performed noncontrast in the setting of renal dysfunction) and CTA ( higher spatial resolution) represent more accurate studies, which are additionally more sensitive to the detection of duplicated renal arteries. 2. Mild left pelvicaliectasis. 3. Bilateral renal cysts as above. 4. No definite left renal vein thrombus is identified.  ASSESSMENT AND PLAN:  1. CHF with SOB and lower ext edema.  May be due to her anemia.  Will check Echo to eval LV function and valves.  BMP and pro BNP along with CXR  Follow up in 2 weeks  Video visit- if echo stable may need to stop amlodipine though she has HF as well.  Also will increase lasix to to 80 mg daily for 3 days then back to usual dose.  And to take her K+  2.  CAD no angina last stress test in 12/19 and neg for ischemia.   3.  Breast cancer followed by oncology  4.  Anemia followed by oncolpgy.    Current medicines are reviewed with the patient today.  The patient Has no concerns regarding medicines.  The following changes have been made:  See above Labs/ tests ordered today include:see above  Disposition:   FU:  see above  Signed, Cecilie Kicks, NP  08/23/2018 8:49 PM    [facility] [place], [place], [address] [place], [place] Phone: [phone]; Fax: [phone]

## 2018-08-22 NOTE — Patient Instructions (Signed)
Medication Instructions:  Your physician has recommended you make the following change in your medication:  1.  INCREASE the Lasix to 80 mg for 3 days and make sure you take Potassium daily for these 3 days as well  If you need a refill on your cardiac medications before your next appointment, please call your pharmacy.   Lab work: None ordered  If you have labs (blood work) drawn today and your tests are completely normal, you will receive your results only by: Marland Kitchen MyChart Message (if you have MyChart) OR . A paper copy in the mail If you have any lab test that is abnormal or we need to change your treatment, we will call you to review the results.  Testing/Procedures: Your physician has requested that you have an echocardiogram. Echocardiography is a painless test that uses sound waves to create images of your heart. It provides your doctor with information about the size and shape of your heart and how well your heart's chambers and valves are working. This procedure takes approximately one hour. There are no restrictions for this procedure.  A chest x-ray takes a picture of the organs and structures inside the chest, including the heart, lungs, and blood vessels. This test can show several things, including, whether the heart is enlarges; whether fluid is building up in the lungs; and whether pacemaker / defibrillator leads are still in place.   Follow-Up: At [facility], you and your health needs are our priority.  As part of our continuing mission to provide you with exceptional heart care, we have created designated Provider Care Teams.  These Care Teams include your primary Cardiologist (physician) and Advanced Practice Providers (APPs -  Physician Assistants and Nurse Practitioners) who all work together to provide you with the care you need, when you need it. . YOU ARE SCHEDULED FOR A VIRTUAL VISIT VIA PHONE 09/03/2018 AT 1:30.  SOMEONE WILL CALL YOU 15 MINS PRIOR TO YOUR APPOINTMENT  TIME TO GET YOUR BLOOD PRESSURE, PULSE RATE, AND GO OVER YOUR MEDICATIONS.  ONCE THIS IS DONE, THEY WILL LET THE PROVIDER KNOW THEN THEY WILL CALL YOU FOR THE VISIT.    TELEPHONE CALL NOTE  This patient has been deemed a candidate for follow-up tele-health visit to limit community exposure during the Covid-19 pandemic. I spoke with the patient via phone to discuss instructions. This has been outlined on the patient's AVS (dotphrase: [email or web address]). The patient was advised to review the section on consent for treatment as well. The patient will receive a phone call 2-3 days prior to their E-Visit at which time consent will be verbally confirmed.   A Virtual Office Visit appointment type has been scheduled for [name] with Cecilie Kicks, NP, with "VIDEO" or "TELEPHONE" in the appointment notes - patient prefers PHONE type.  I have either confirmed the patient is active in MyChart or offered to send sign-up link to phone/email via Mychart icon beside patient's photo.YES BUT PT GAVE VERBAL IN OFFICE  [name], [place] 08/22/2018 4:07 PM     Any Other Special Instructions Will Be Listed Below (If Applicable).  Echocardiogram An echocardiogram is a procedure that uses painless sound waves (ultrasound) to produce an image of the heart. Images from an echocardiogram can provide important information about:  Signs of coronary artery disease (CAD).  Aneurysm detection. An aneurysm is a weak or damaged part of an artery wall that bulges out from the normal force of blood pumping through the body.  Heart size  and shape. Changes in the size or shape of the heart can be associated with certain conditions, including heart failure, aneurysm, and CAD.  Heart muscle function.  Heart valve function.  Signs of a past heart attack.  Fluid buildup around the heart.  Thickening of the heart muscle.  A tumor or infectious growth around the heart valves. Tell a health care provider about:  Any  allergies you have.  All medicines you are taking, including vitamins, herbs, eye drops, creams, and over-the-counter medicines.  Any blood disorders you have.  Any surgeries you have had.  Any medical conditions you have.  Whether you are pregnant or may be pregnant. What are the risks? Generally, this is a safe procedure. However, problems may occur, including:  Allergic reaction to dye (contrast) that may be used during the procedure. What happens before the procedure? No specific preparation is needed. You may eat and drink normally. What happens during the procedure?   An IV tube may be inserted into one of your veins.  You may receive contrast through this tube. A contrast is an injection that improves the quality of the pictures from your heart.  A gel will be applied to your chest.  A wand-like tool (transducer) will be moved over your chest. The gel will help to transmit the sound waves from the transducer.  The sound waves will harmlessly bounce off of your heart to allow the heart images to be captured in real-time motion. The images will be recorded on a computer. The procedure may vary among health care providers and hospitals. What happens after the procedure?  You may return to your normal, everyday life, including diet, activities, and medicines, unless your health care provider tells you not to do that. Summary  An echocardiogram is a procedure that uses painless sound waves (ultrasound) to produce an image of the heart.  Images from an echocardiogram can provide important information about the size and shape of your heart, heart muscle function, heart valve function, and fluid buildup around your heart.  You do not need to do anything to prepare before this procedure. You may eat and drink normally.  After the echocardiogram is completed, you may return to your normal, everyday life, unless your health care provider tells you not to do that. This  information is not intended to replace advice given to you by your health care provider. Make sure you discuss any questions you have with your health care provider. Document Released: 03/02/2000 Document Revised: 04/07/2016 Document Reviewed: 04/07/2016 Elsevier Interactive Patient Education  2019 [employer].

## 2018-08-23 ENCOUNTER — Encounter: Payer: Self-pay | Admitting: Cardiology

## 2018-08-23 LAB — URINALYSIS
Bilirubin, UA: NEGATIVE
Glucose, UA: NEGATIVE
Ketones, UA: NEGATIVE
Leukocytes,UA: NEGATIVE
Nitrite, UA: NEGATIVE
RBC, UA: NEGATIVE
Specific Gravity, UA: 1.011 (ref 1.005–1.030)
Urobilinogen, Ur: 0.2 mg/dL (ref 0.2–1.0)
pH, UA: 5 (ref 5.0–7.5)

## 2018-08-25 ENCOUNTER — Telehealth: Payer: Self-pay | Admitting: *Deleted

## 2018-08-25 ENCOUNTER — Inpatient Hospital Stay: Payer: Medicare Other

## 2018-08-25 ENCOUNTER — Ambulatory Visit: Payer: Medicare Other

## 2018-08-25 ENCOUNTER — Ambulatory Visit
Admission: RE | Admit: 2018-08-25 | Discharge: 2018-08-25 | Disposition: A | Discharge status: Home / Self Care | Payer: Medicare Other | Source: Ambulatory Visit | Attending: Cardiology | Admitting: Cardiology

## 2018-08-25 ENCOUNTER — Other Ambulatory Visit: Payer: Self-pay

## 2018-08-25 DIAGNOSIS — R0602 Shortness of breath: Secondary | ICD-10-CM

## 2018-08-25 NOTE — Telephone Encounter (Signed)
Follow up:    Patient returning call concering some results. Please call patient.

## 2018-08-25 NOTE — Telephone Encounter (Signed)
-----   Message from Isaiah Serge, NP sent at 08/23/2018  9:36 PM EDT ----- No infection in urine.  Ask pt follow up with her PCP for freq urination at night.  Thanks.

## 2018-08-25 NOTE — Telephone Encounter (Signed)
Returned pts call and she has been made aware of her lab results.

## 2018-08-25 NOTE — Telephone Encounter (Signed)
Call placed to pt re: UA results.  Left pt a message to call back.  Also, need to move pt's echo up as well.

## 2018-08-27 ENCOUNTER — Telehealth: Payer: Self-pay | Admitting: *Deleted

## 2018-08-27 NOTE — Telephone Encounter (Signed)
Received message from tech that pt needs to r/s appt for tomorrow due to having Covid testing on Sat & not receiving test results yet.  Informed Dr Burr Medico & called Infusion to cancel.  Dr Burr Medico will let Mendel Ryder NP know.  Infusion to call Lab in am to cancel BMBX.  Message to scheduler to r/s.

## 2018-08-28 ENCOUNTER — Other Ambulatory Visit: Payer: Medicare Other

## 2018-08-29 ENCOUNTER — Telehealth: Payer: Self-pay | Admitting: *Deleted

## 2018-08-29 NOTE — Telephone Encounter (Signed)
Received call from patient . She states that she had her COVID 19 test done (not at a a [facility]) and it came back negative. She is asking to have her bone marrow biopsy scheduled now.  The number she states to call to confirm is [phone] This # was called and an automated response was given when pt's name was given. The response was "negative".

## 2018-08-29 NOTE — Telephone Encounter (Signed)
OK, I will Cecille Rubin and Mendel Ryder to reschedule her biopsy.   Truitt Merle MD

## 2018-09-02 ENCOUNTER — Other Ambulatory Visit: Payer: Self-pay | Admitting: Physical Medicine & Rehabilitation

## 2018-09-02 ENCOUNTER — Telehealth: Payer: Self-pay | Admitting: Hematology

## 2018-09-02 DIAGNOSIS — K219 Gastro-esophageal reflux disease without esophagitis: Secondary | ICD-10-CM

## 2018-09-02 NOTE — Telephone Encounter (Signed)
Added bmbx for 6/23. Confirmed with patient. Other appointments remain the same. Message to LC/desk nurse re not being able to add block or reminder to LC's schedule for 6/23 due to there is no 7:30 am block time for LC.

## 2018-09-03 ENCOUNTER — Telehealth: Payer: Self-pay | Admitting: Hematology

## 2018-09-03 ENCOUNTER — Telehealth: Payer: Self-pay | Admitting: *Deleted

## 2018-09-03 ENCOUNTER — Telehealth (HOSPITAL_COMMUNITY): Payer: Self-pay | Admitting: Radiology

## 2018-09-03 ENCOUNTER — Telehealth: Payer: Medicare Other | Admitting: Cardiology

## 2018-09-03 NOTE — Telephone Encounter (Signed)
COVID-19 Pre-Screening Questions:  . Do you currently have a fever?NO (yes = cancel and refer to pcp for e-visit) . Have you recently travelled on a cruise, internationally, or to [place], [place], [place], [place], [place], or [place], [place] [employer]) ? NO (yes = cancel, stay home, monitor symptoms, and contact pcp or initiate e-visit if symptoms develop) . Have you been in contact with someone that is currently pending confirmation of Covid19 testing or has been confirmed to have the [place] virus?  NO (yes = cancel, stay home, away from tested individual, monitor symptoms, and contact pcp or initiate e-visit if symptoms develop) . Are you currently experiencing fatigue or cough? NO (yes = pt should be prepared to have a mask placed at the time of their visit).

## 2018-09-03 NOTE — Telephone Encounter (Signed)
Added 6/29 appointments. Spoke with patient. Also confirmed 6/23.

## 2018-09-03 NOTE — Telephone Encounter (Signed)
Call placed to pt re: virtual appt 09/09/2018.  Need to change to in-office. Left a message for pt to call back.

## 2018-09-04 ENCOUNTER — Ambulatory Visit (HOSPITAL_COMMUNITY): Payer: Medicare Other | Attending: Cardiovascular Disease

## 2018-09-04 ENCOUNTER — Other Ambulatory Visit: Payer: Self-pay

## 2018-09-04 DIAGNOSIS — R0602 Shortness of breath: Secondary | ICD-10-CM | POA: Diagnosis not present

## 2018-09-04 NOTE — Telephone Encounter (Signed)
Call placed to pt re: appt 09/09/2018, pt answered not to the following questions       COVID-19 Pre-Screening Questions:  . In the past 7 to 10 days have you had a cough,  shortness of breath, headache, congestion, fever (100 or greater) body aches, chills, sore throat, or sudden loss of taste or sense of smell? . Have you been around anyone with known Covid 19. . Have you been around anyone who is awaiting Covid 19 test results in the past 7 to 10 days? . Have you been around anyone who has been exposed to Covid 19, or has mentioned symptoms of Covid 19 within the past 7 to 10 days?  If you have any concerns/questions about symptoms patients report during screening (either on the phone or at threshold). Contact the provider seeing the patient or DOD for further guidance.  If neither are available contact a member of the leadership team.

## 2018-09-08 ENCOUNTER — Ambulatory Visit: Payer: Medicare Other | Admitting: Hematology

## 2018-09-08 ENCOUNTER — Ambulatory Visit: Payer: Medicare Other

## 2018-09-09 ENCOUNTER — Inpatient Hospital Stay: Payer: Medicare Other

## 2018-09-09 ENCOUNTER — Encounter: Payer: Self-pay | Admitting: Cardiology

## 2018-09-09 ENCOUNTER — Ambulatory Visit (INDEPENDENT_AMBULATORY_CARE_PROVIDER_SITE_OTHER): Payer: Medicare Other | Admitting: Cardiology

## 2018-09-09 ENCOUNTER — Other Ambulatory Visit: Payer: Self-pay

## 2018-09-09 ENCOUNTER — Inpatient Hospital Stay (HOSPITAL_BASED_OUTPATIENT_CLINIC_OR_DEPARTMENT_OTHER): Payer: Medicare Other | Admitting: Adult Health

## 2018-09-09 VITALS — BP 124/64 | HR 79 | Ht 65.0 in | Wt 195.0 lb

## 2018-09-09 VITALS — BP 148/66 | HR 71 | Temp 97.9°F | Resp 18

## 2018-09-09 DIAGNOSIS — D509 Iron deficiency anemia, unspecified: Secondary | ICD-10-CM

## 2018-09-09 DIAGNOSIS — I209 Angina pectoris, unspecified: Secondary | ICD-10-CM

## 2018-09-09 DIAGNOSIS — D464 Refractory anemia, unspecified: Secondary | ICD-10-CM | POA: Diagnosis not present

## 2018-09-09 DIAGNOSIS — I2510 Atherosclerotic heart disease of native coronary artery without angina pectoris: Secondary | ICD-10-CM | POA: Diagnosis not present

## 2018-09-09 DIAGNOSIS — Z853 Personal history of malignant neoplasm of breast: Secondary | ICD-10-CM | POA: Diagnosis not present

## 2018-09-09 DIAGNOSIS — R0602 Shortness of breath: Secondary | ICD-10-CM | POA: Diagnosis not present

## 2018-09-09 LAB — COMPREHENSIVE METABOLIC PANEL
ALT: 13 U/L (ref 0–44)
AST: 14 U/L — ABNORMAL LOW (ref 15–41)
Albumin: 3.7 g/dL (ref 3.5–5.0)
Alkaline Phosphatase: 62 U/L (ref 38–126)
Anion gap: 10 (ref 5–15)
BUN: 58 mg/dL — ABNORMAL HIGH (ref 8–23)
CO2: 25 mmol/L (ref 22–32)
Calcium: 9.6 mg/dL (ref 8.9–10.3)
Chloride: 105 mmol/L (ref 98–111)
Creatinine, Ser: 2.45 mg/dL — ABNORMAL HIGH (ref 0.44–1.00)
GFR calc Af Amer: 22 mL/min — ABNORMAL LOW (ref >60)
GFR calc non Af Amer: 19 mL/min — ABNORMAL LOW (ref >60)
Glucose, Bld: 198 mg/dL — ABNORMAL HIGH (ref 70–99)
Potassium: 4.6 mmol/L (ref 3.5–5.1)
Sodium: 140 mmol/L (ref 135–145)
Total Bilirubin: 0.3 mg/dL (ref 0.3–1.2)
Total Protein: 8.1 g/dL (ref 6.5–8.1)

## 2018-09-09 LAB — CBC WITH DIFFERENTIAL/PLATELET
Abs Immature Granulocytes: 0.1 10*3/uL — ABNORMAL HIGH (ref 0.00–0.07)
Basophils Absolute: 0 10*3/uL (ref 0.0–0.1)
Basophils Relative: 0 %
Eosinophils Absolute: 0.1 10*3/uL (ref 0.0–0.5)
Eosinophils Relative: 1 %
HCT: 29.5 % — ABNORMAL LOW (ref 36.0–46.0)
Hemoglobin: 9 g/dL — ABNORMAL LOW (ref 12.0–15.0)
Immature Granulocytes: 1 %
Lymphocytes Relative: 20 %
Lymphs Abs: 1.6 10*3/uL (ref 0.7–4.0)
MCH: 26.3 pg (ref 26.0–34.0)
MCHC: 30.5 g/dL (ref 30.0–36.0)
MCV: 86.3 fL (ref 80.0–100.0)
Monocytes Absolute: 0.5 10*3/uL (ref 0.1–1.0)
Monocytes Relative: 7 %
Neutro Abs: 5.7 10*3/uL (ref 1.7–7.7)
Neutrophils Relative %: 71 %
Platelets: 304 10*3/uL (ref 150–400)
RBC: 3.42 MIL/uL — ABNORMAL LOW (ref 3.87–5.11)
RDW: 14.2 % (ref 11.5–15.5)
WBC: 8.1 10*3/uL (ref 4.0–10.5)
nRBC: 0 % (ref 0.0–0.2)

## 2018-09-09 MED ORDER — ISOSORBIDE MONONITRATE ER 30 MG PO TB24: 15.0000 mg | ORAL_TABLET | Freq: Every day | ORAL | 1 refills | Status: DC

## 2018-09-09 MED ORDER — LIDOCAINE HCL 2 % IJ SOLN
INTRAMUSCULAR | Status: AC
  Filled 2018-09-09: qty 20

## 2018-09-09 NOTE — Progress Notes (Signed)
Cardiology Office Note   Date:  09/09/2018   ID:  [name], DOB 1945/08/02, MRN [id number]  PCP:  Seward Carol, MD  Cardiologist:  Dr. Johnsie Cancel   Chief Complaint  Patient presents with  . Shortness of Breath      History of Present Illness: [name] is a 73 y.o. female who presents for SOB and edema follow up.   She was placed back on lasix 40 mg daily and was seen June 5  CAD with pevious rotoblator to the LAD in 2000. Myovue on 11/2006 showed mild apical thinning and was low risk.   May 2019 admitted with sepsis syndrome pneumonia She had alveolar hemorrhage and brief PEA Arrest post bronchoscopy and was intubated for days and d/c to inpatient rehab   Breast cancer and sees Dr Burr Medico was on Arimidex but hospitalized in July with SBO pneumonia and pancreatic cyst. Noted to have renal vein thrombosis and placed on xarelto  For 6 months   Last TTE done 07/27/17 reviewed EF 55-60% Moderate MR  Estimated PA pressure 64 mmHg  She has some pain in chest with activity and after eating Not severe and only lasts minutes Nitro gives her headache and "she hasn't needed it"  She is anemic with Hgb of 8.5  Iron level 55 Vitd 14  Her Cr is climbing 2.83  In July 2019 was 1.95   Last labs August 18 2018  She has had increased SOB and lower ext edema. She has had to prop up on pillows.  Wt is up 10 lbs.   lasix increased to 80 mg X 3 days then back to usual.   Echo with EF 60-65%  Cr today at 2.45 and stable to improved. Hgb 9.0  Vit D level was low   She just had bone marrow aspiration.  Today overall she is better, no edema and continues lasix 40 mg daily.  But with exertions she still has dyspnea and some chest pressure.  She rests for 5 min and resolves.       Past Medical History:  Diagnosis Date  . Abdominal discomfort    Chronic N/V/D. Presumptive dx Crohn's dx per elevated p ANCA. Failed Entocort and Pentasa. Sep 2003 - ileocolectomy c anastomosis  per Dr Deon Pilling 2/2 adhesions - path was hegative for Crohns. EGD, Sm bowel follow through (11/03), and an eteroclysis (10/03) were unrevealing. Cuases hypomag and hypocalcemia.  . Adnexal mass 8/03   s/p lap BSO (R ovarian fibroma) & lysis of adhesions  . Allergy    Seasonal  . Anemia    Multifactorial. Baseline HgB 10-11 ish. B12 def - 150 in 3/10. Fe Def - ferritin 35 3/10. Both are being repleted.  . Breast cancer ([place]) 03/16/13   right, 5 o'clock  . CAD (coronary artery disease) 1996   1996 - PTCA and angioplasty diagonal branch. 2000 - Rotoblator & angiopllasty of diagonal. 2006 - subendocardial AMI, DES to proximal LAD.Marland Kitchen Also had 90% stenosis in distal apical LAD. EF 55 with apical hypokinesis. Indefinite ASA and Plavix.  . CHF (congestive heart failure) ([place])   . Chronic kidney disease    Chronic renal insuff baseline Cr 1.2 - 1.4 ish.  . Chronic pain    CT 10/10 = Spinal stenosis L2 - S1.  . Diabetes mellitus    Insulin dependent  . Hx of radiation therapy 06/02/13- 07/16/13   right rbeast 4500 cGy 25 sessions, right breast boost 1600 cGy in 8 sessions  .  Hyperlipidemia    Managed with both a statin and Welchol. Welchol stopped 2014 2/2 cost and started on fenofibrate   . Hypertension    2006 B renal arteries patent. 2003 MRA - no RAS. 2003 pheo W/U Dr Hassell Done reportedly negative.  . Hypoxia 07/23/2017  . Lupus ([place])   . Personal history of radiation therapy   . RBBB   . SBO (small bowel obstruction) ([place]) 09/17/2017  . Stroke [facility])    Incidental finding MRI 2002 L lacunar infarct  . Wears dentures    top    Past Surgical History:  Procedure Laterality Date  . ABDOMINAL HYSTERECTOMY    . BILATERAL SALPINGOOPHORECTOMY  8/03   Lap BSO (R ovarian fibroma) and adhesion lysis  . BOWEL RESECTION  2003   ileocolectomy with anastomosis 2/2 adhesions  . BREAST BIOPSY    . BREAST LUMPECTOMY Right   . BREAST LUMPECTOMY WITH NEEDLE LOCALIZATION AND AXILLARY SENTINEL LYMPH NODE BX  Right 04/22/2013   Procedure: BREAST LUMPECTOMY WITH NEEDLE LOCALIZATION AND AXILLARY SENTINEL LYMPH NODE BX;  Surgeon: Stark Klein, MD;  Location: [facility];  Service: General;  Laterality: Right;  . CARDIAC CATHETERIZATION     2 stents  . CHOLECYSTECTOMY    . COLONOSCOPY    . HEMICOLECTOMY     R sided hemicolectomy  . HERNIA REPAIR     Ventral hernia repair  . PTCA  4/06     Current Outpatient Medications  Medication Sig Dispense Refill  . acetaminophen (TYLENOL) 325 MG tablet Take 650 mg by mouth every 6 (six) hours as needed for mild pain.    Marland Kitchen amLODipine (NORVASC) 10 MG tablet Take 1 tablet (10 mg total) by mouth daily. 30 tablet 0  . atorvastatin (LIPITOR) 40 MG tablet Take 1 tablet (40 mg total) by mouth at bedtime. 90 tablet 3  . carvedilol (COREG) 25 MG tablet Take 25 mg by mouth as directed.    . desloratadine (CLARINEX) 5 MG tablet Take 5 mg by mouth at bedtime as needed (allergies). Reported on 04/11/2015    . ergocalciferol (VITAMIN D2) 1.25 MG (50000 UT) capsule Take 1 capsule (50,000 Units total) by mouth once a week. X 8 wks & then go to OTC dose of 1000 units daily. 8 capsule 0  . fenofibrate 54 MG tablet Take 54 mg by mouth daily.     . ferrous fumarate (HEMOCYTE - 106 MG FE) 325 (106 Fe) MG TABS tablet Take 1 tablet by mouth as directed.    . furosemide (LASIX) 40 MG tablet Take 40 mg by mouth as directed.    . gabapentin (NEURONTIN) 100 MG capsule Take 1 capsule (100 mg total) by mouth 4 (four) times daily. 120 capsule 3  . hydrALAZINE (APRESOLINE) 10 MG tablet Take 10 mg by mouth as directed.    . hydrOXYzine (ATARAX/VISTARIL) 25 MG tablet Take 25 mg by mouth 2 (two) times daily as needed.    . linagliptin (TRADJENTA) 5 MG TABS tablet Take 5 mg by mouth daily.    . nitroGLYCERIN (NITROSTAT) 0.4 MG SL tablet Place 1 tablet (0.4 mg total) under the tongue every 5 (five) minutes as needed for chest pain. 25 tablet 3  . pantoprazole (PROTONIX) 40 MG tablet  TAKE 1 TABLET BY MOUTH ONCE DAILY 30 tablet 3  . potassium chloride SA (K-DUR,KLOR-CON) 20 MEQ tablet Take 1 tablet (20 mEq total) by mouth daily as needed (when taking lasix). 30 tablet 1  . ULORIC 40  MG tablet Take 1 tablet by mouth daily.    . isosorbide mononitrate (IMDUR) 30 MG 24 hr tablet Take 0.5 tablets (15 mg total) by mouth daily. 45 tablet 1   No current facility-administered medications for this visit.     Allergies:   Percocet [oxycodone-acetaminophen]; Diazepam; Haloperidol lactate; Lorazepam; Morphine sulfate; Propoxyphene hcl; Tramadol hcl; Anesthetics, halogenated; Etomidate; Fentanyl; and Versed [midazolam]    Social History:  The patient  reports that she has never smoked. She has never used smokeless tobacco. She reports that she does not drink alcohol or use drugs.   Family History:  The patient's family history includes Breast cancer in her maternal aunt and maternal grandmother; Breast cancer (age of onset: 19) in her mother; Clotting disorder in her maternal grandmother; Colon cancer in her maternal grandmother; Diabetes in her maternal grandmother; Heart attack in her maternal grandmother; Hypertension in her daughter; Kidney failure in her maternal aunt; Lung cancer in her maternal aunt; Pancreatic cancer (age of onset: 74) in her brother; Prostate cancer in her maternal uncle.    ROS:  General:no colds or fevers,  weight down 5 lbs Skin:no rashes or ulcers HEENT:no blurred vision, no congestion CV:see HPI PUL:see HPI GI:no diarrhea constipation or melena, no indigestion GU:no hematuria, no dysuria MS:no joint pain, no claudication Neuro:no syncope, no lightheadedness Endo:no diabetes, no thyroid disease Vitamin D found to be low.   Wt Readings from Last 3 Encounters:  09/09/18 195 lb (88.5 kg)  08/22/18 198 lb 6.4 oz (90 kg)  08/18/18 200 lb (90.7 kg)     PHYSICAL EXAM: VS:  BP 124/64   Pulse 79   Ht 5' 5"  (1.651 m)   Wt 195 lb (88.5 kg)   SpO2 98%    BMI 32.45 kg/m  , BMI Body mass index is 32.45 kg/m. General:Pleasant affect, NAD Skin:Warm and dry, brisk capillary refill Neck:supple, no JVD, no bruits  Heart:S1S2 RRR without murmur, gallup, rub or click Lungs:clear without rales, rhonchi, or wheezes SAY:TKZS, non tender, + BS, do not palpate liver spleen or masses Ext:no lower ext edema, 2+ pedal pulses, 2+ radial pulses Neuro:alert and oriented X 3, [name], follows commands, + facial symmetry    EKG:  EKG is NOT ordered today.     Recent Labs: 09/09/2018: ALT 13; BUN 58; Creatinine, Ser 2.45; Hemoglobin 9.0; Platelets 304; Potassium 4.6; Sodium 140    Lipid Panel    Component Value Date/Time   CHOL 183 07/21/2015 1106   TRIG 107 07/24/2017 1444   HDL 47 07/21/2015 1106   CHOLHDL 3.9 07/21/2015 1106   CHOLHDL 3.8 09/10/2013 1041   VLDL 56 (H) 09/10/2013 1041   LDLCALC 88 07/21/2015 1106   LDLDIRECT 55 07/01/2012 1016       Other studies Reviewed: Additional studies/ records that were reviewed today include: . Echo 09/04/18   IMPRESSIONS    1. The left ventricle has normal systolic function with an ejection fraction of 60-65%. The cavity size was normal. There is moderate asymmetric left ventricular hypertrophy. Left ventricular diastolic Doppler parameters are consistent with impaired  relaxation. Indeterminate filling pressures The E/e' is 8-15. No evidence of left ventricular regional wall motion abnormalities.  2. The right ventricle has normal systolic function. The cavity was normal. There is no increase in right ventricular wall thickness.  3. The mitral valve is abnormal. Mild thickening of the mitral valve leaflet.  4. The tricuspid valve is grossly normal.  5. The aortic valve is grossly normal. No  stenosis of the aortic valve.  6. The average left ventricular global longitudinal strain is -19.6 %.  SUMMARY   LVEF 60-65%, moderate assymetric septal hypertrophy, grade 1 DD, indeterminate LV filling  pressure, normal GLS at -20%, trivial MR, normal LA size, trivial TR, normal RVSP, normal IVC  FINDINGS  Left Ventricle: The left ventricle has normal systolic function, with an ejection fraction of 60-65%. The cavity size was normal. There is moderate asymmetric left ventricular hypertrophy. Left ventricular diastolic Doppler parameters are consistent  with impaired relaxation. Indeterminate filling pressures The E/e' is 8-15. No evidence of left ventricular regional wall motion abnormalities.. The average left ventricular global longitudinal strain is -19.6 %.  Right Ventricle: The right ventricle has normal systolic function. The cavity was normal. There is no increase in right ventricular wall thickness.  Left Atrium: Left atrial size was normal in size.  Right Atrium: Right atrial size was normal in size. Right atrial pressure is estimated at 10 mmHg.  Interatrial Septum: No atrial level shunt detected by color flow Doppler.  Pericardium: There is no evidence of pericardial effusion.  Mitral Valve: The mitral valve is abnormal. Mild thickening of the mitral valve leaflet. Mitral valve regurgitation is trivial by color flow Doppler.  Tricuspid Valve: The tricuspid valve is grossly normal. Tricuspid valve regurgitation is mild by color flow Doppler.  Aortic Valve: The aortic valve is grossly normal Aortic valve regurgitation was not visualized by color flow Doppler. There is No stenosis of the aortic valve.  Pulmonic Valve: The pulmonic valve was grossly normal. Pulmonic valve regurgitation is not visualized by color flow Doppler.  Venous: The inferior vena cava is normal in size with greater than 50% respiratory variability.   nuc study 02/27/18 Addendum by Sueanne Margarita, MD on Sun Mar 02, 2018 7:58 PM   The left ventricular ejection fraction is moderately decreased (30-44%).  Nuclear stress EF: 37%.  There was no ST segment deviation noted during stress.  Study  is of very poor quality due to evere extracardiac tracer uptake  Recommend repeat stress images or consider coronary CTA    Finalized by Sueanne Margarita, MD on Sun Mar 02, 2018 7:57 PM   The left ventricular ejection fraction is moderately decreased (30-44%).  Nuclear stress EF: 37%.  There was no ST segment deviation noted during stress.        ASSESSMENT AND PLAN:  1.  Exertional angina, mild - last cath 2000, her Cr is 2.45 so cardiac cath and cardiac CTA should be held if possible.  On Echo no RWMA, will add imdur 15 mg to see if this will prevent angina.  Will ask DR Johnsie Cancel for his input as well. If headache develops she will try tylenol.  If severe H/A we will need to stop.   2. CHF diastolic and may be due to anemia, her edema has improved. Continue lasix at 40 mg and she will call if increase in SOB or edema.    3.  Hx of CAD with PCI 2000 to LAD + CAD on CTA of chest as well  4.  Anemia, had bone marrow aspiration today. Followed by oncology.       Current medicines are reviewed with the patient today.  The patient Has no concerns regarding medicines.  The following changes have been made:  See above Labs/ tests ordered today include:see above  Disposition:   FU:  see above  Signed, Cecilie Kicks, NP  09/09/2018 2:42 PM  [place], [place], [place] Phone: [phone]; Fax: [phone]

## 2018-09-09 NOTE — Progress Notes (Signed)
Biopsy site dressing clean & intact, VS stable.  DC instructions reviewed, pt verbalizes understanding.  Pt DC'd in stable condition.

## 2018-09-09 NOTE — Patient Instructions (Addendum)
Medication Instructions:   Start Imdur 15 mg  ( Half of  30 mg)  once a day   If you need a refill on your cardiac medications before your next appointment, please call your pharmacy.   Lab work:  NONE ORDERED  TODAY   If you have labs (blood work) drawn today and your tests are completely normal, you will receive your results only by: Marland Kitchen MyChart Message (if you have MyChart) OR . A paper copy in the mail If you have any lab test that is abnormal or we need to change your treatment, we will call you to review the results.  Testing/Procedures: NONE ORDERED  TODAY    Follow-Up:  IN ONE MONTH WITH [facility]   Any Other Special Instructions Will Be Listed Below (If Applicable).

## 2018-09-09 NOTE — Patient Instructions (Signed)
Bone Marrow Aspiration and Bone Marrow Biopsy, Adult, Care After This sheet gives you information about how to care for yourself after your procedure. Your health care provider may also give you more specific instructions. If you have problems or questions, contact your health care provider. What can I expect after the procedure? After the procedure, it is common to have:  Mild pain and tenderness.  Swelling.  Bruising. Follow these instructions at home: Puncture site care      Follow instructions from your health care provider about how to take care of the puncture site. Make sure you: ? Wash your hands with soap and water before you change your bandage (dressing). If soap and water are not available, use hand sanitizer. ? Change your dressing as told by your health care provider.  Check your puncture siteevery day for signs of infection. Check for: ? More redness, swelling, or pain. ? More fluid or blood. ? Warmth. ? Pus or a bad smell. General instructions  Take over-the-counter and prescription medicines only as told by your health care provider.  Do not take baths, swim, or use a hot tub until your health care provider approves. Ask if you can take a shower or have a sponge bath.  Return to your normal activities as told by your health care provider. Ask your health care provider what activities are safe for you.  Do not drive for 24 hours if you were given a medicine to help you relax (sedative) during your procedure.  Keep all follow-up visits as told by your health care provider. This is important. Contact a health care provider if:  Your pain is not controlled with medicine. Get help right away if:  You have a fever.  You have more redness, swelling, or pain around the puncture site.  You have more fluid or blood coming from the puncture site.  Your puncture site feels warm to the touch.  You have pus or a bad smell coming from the puncture site. These  symptoms may represent a serious problem that is an emergency. Do not wait to see if the symptoms will go away. Get medical help right away. Call your local emergency services (911 in the [place] Do not drive yourself to the hospital. Summary  After the procedure, it is common to have mild pain, tenderness, swelling, and bruising.  Follow instructions from your health care provider about how to take care of the puncture site.  Get help right away if you have any symptoms of infection or if you have more blood or fluid coming from the puncture site. This information is not intended to replace advice given to you by your health care provider. Make sure you discuss any questions you have with your health care provider. Document Released: 09/22/2004 Document Revised: 06/18/2017 Document Reviewed: 08/17/2015 Elsevier Interactive Patient Education  2019 [employer].

## 2018-09-09 NOTE — Progress Notes (Signed)
INDICATION: refractory anemia    Bone Marrow Biopsy and Aspiration Procedure Note   Informed consent was obtained and potential risks including bleeding, infection and pain were reviewed with the patient.  The patient's name, date of birth, identification, consent and allergies were verified prior to the start of procedure and time out was performed.  The left posterior iliac crest was chosen as the site of biopsy.  The skin was prepped with ChloraPrep.   14 cc of 2% lidocaine was used to provide local anaesthesia.   10 cc of bone marrow aspirate was obtained followed by 1cm biopsy.  Pressure was applied to the biopsy site and bandage was placed over the biopsy site. Patient was made to lie on the back for 30 mins prior to discharge.  The procedure was tolerated well. COMPLICATIONS: None BLOOD LOSS: none The patient was discharged home in stable condition with a follow up to review results.  Patient was provided with post bone marrow biopsy instructions and instructed to call if there was any bleeding or worsening pain.  Specimens sent for flow cytometry, cytogenetics and additional studies.  Signed Scot Dock, NP

## 2018-09-10 ENCOUNTER — Other Ambulatory Visit (HOSPITAL_COMMUNITY): Payer: Medicare Other

## 2018-09-11 NOTE — Progress Notes (Signed)
[name]   [phone] [phone]   Clinic Follow up Note   Patient Care Team: Seward Carol, MD as PCP - General (Internal Medicine) Josue Hector, MD as PCP - Cardiology (Cardiology) Thelma Comp, [place] as Consulting Physician (Optometry) Stark Klein, MD as Consulting Physician (General Surgery) Truitt Merle, MD as Consulting Physician (Hematology) Madelon Lips, MD as Consulting Physician (Nephrology)  Date of Service:  09/15/2018  CHIEF COMPLAINT: Followup of rightbreast cancer and anemia of chronic disease  SUMMARY OF ONCOLOGIC HISTORY: Oncology History Overview Note  Cancer Staging Breast cancer of lower-inner quadrant of right female breast [facility]) Staging form: Breast, AJCC 7th Edition - Clinical: Stage IA (T1b, N0, cM0) - Unsigned Staging comments: Staged at breast conference 03/25/13.  - Pathologic stage from 05/12/2013: Stage IA (T1b, N0, cM0) - Signed by Truitt Merle, MD on 06/21/2016     Breast cancer of lower-inner quadrant of right female breast ([place])  02/27/2013 Mammogram   Diagnostic mammogram and ultrasound showed a 6 x 5 mm mass at 5:00 position. Axilla was negative by ultrasound.   03/16/2013 Initial Biopsy   Right breast 5:00 position biopsy showed invasive ductal carcinoma, low-grade   03/16/2013 Receptors her2   ER100% positive, PR 100% positive, HER-2 negative, Ki-67 20%   03/18/2013 Initial Diagnosis   Breast cancer of lower-inner quadrant of right female breast ([place])   05/12/2013 Surgery   Right breast lumpectomy with sentinel lymph node biopsy   05/12/2013 Pathology Results   Right breast lumpectomy showed invasive ductal carcinoma, grade 2, 0.9 cm, 6 sentinel lymph nodes were all negative. Margins were negative.   05/12/2013 Oncotype testing   Oncotype recurrence score 19, intermediate risk, 10 year risk of distant recurrence 12% with tamoxifen for 5 years. Patient declined adjuvant chemotherapy.    06/02/2013 - 07/16/2013 Radiation Therapy   Adjuvant breast radiation with boost 1600Gy   08/06/2013 - 07/2017 Anti-estrogen oral therapy   Arimidex 1 mg daily. Stopped in 07/2017 due to acute illness    03/22/2017 Mammogram   IMPRESSION: No mammographic evidence for malignancy.      CURRENT THERAPY:  -Aranesp injection monthly starting 09/2018  -IV Feraheme as needed starting 09/15/18  -Oral Ferrous sulfate daily   INTERVAL HISTORY:  [name] is here for a follow up right breast cancer and anemia. She presents to the clinic alone. She notes her BM biopsy went well. She has been taking oral iron once daily. She notes having SOB even with talking she saw Dr Johnsie Cancel about this. She plans to see him again in 09/2018.    REVIEW OF SYSTEMS:   Constitutional: Denies fevers, chills or abnormal weight loss Eyes: Denies blurriness of vision Ears, nose, mouth, throat, and face: Denies mucositis or sore throat Respiratory: Denies cough, dyspnea or wheezes (+) SOB with mild exertion Cardiovascular: Denies palpitation, chest discomfort or lower extremity swelling Gastrointestinal:  Denies nausea, heartburn or change in bowel habits Skin: Denies abnormal skin rashes Lymphatics: Denies new lymphadenopathy or easy bruising Neurological:Denies numbness, tingling or new weaknesses Behavioral/Psych: Mood is stable, no new changes  All other systems were reviewed with the patient and are negative.  MEDICAL HISTORY:  Past Medical History:  Diagnosis Date  . Abdominal discomfort    Chronic N/V/D. Presumptive dx Crohn's dx per elevated p ANCA. Failed Entocort and Pentasa. Sep 2003 - ileocolectomy c anastomosis per Dr Deon Pilling 2/2 adhesions - path was hegative for Crohns. EGD, Sm bowel follow through (11/03), and an eteroclysis (  10/03) were unrevealing. Cuases hypomag and hypocalcemia.  . Adnexal mass 8/03   s/p lap BSO (R ovarian fibroma) & lysis of adhesions  . Allergy    Seasonal  . Anemia     Multifactorial. Baseline HgB 10-11 ish. B12 def - 150 in 3/10. Fe Def - ferritin 35 3/10. Both are being repleted.  . Breast cancer ([place]) 03/16/13   right, 5 o'clock  . CAD (coronary artery disease) 1996   1996 - PTCA and angioplasty diagonal branch. 2000 - Rotoblator & angiopllasty of diagonal. 2006 - subendocardial AMI, DES to proximal LAD.Marland Kitchen Also had 90% stenosis in distal apical LAD. EF 55 with apical hypokinesis. Indefinite ASA and Plavix.  . CHF (congestive heart failure) ([place])   . Chronic kidney disease    Chronic renal insuff baseline Cr 1.2 - 1.4 ish.  . Chronic pain    CT 10/10 = Spinal stenosis L2 - S1.  . Diabetes mellitus    Insulin dependent  . Hx of radiation therapy 06/02/13- 07/16/13   right rbeast 4500 cGy 25 sessions, right breast boost 1600 cGy in 8 sessions  . Hyperlipidemia    Managed with both a statin and Welchol. Welchol stopped 2014 2/2 cost and started on fenofibrate   . Hypertension    2006 B renal arteries patent. 2003 MRA - no RAS. 2003 pheo W/U Dr Hassell Done reportedly negative.  . Hypoxia 07/23/2017  . Lupus ([place])   . Personal history of radiation therapy   . RBBB   . SBO (small bowel obstruction) ([place]) 09/17/2017  . Stroke [facility])    Incidental finding MRI 2002 L lacunar infarct  . Wears dentures    top    SURGICAL HISTORY: Past Surgical History:  Procedure Laterality Date  . ABDOMINAL HYSTERECTOMY    . BILATERAL SALPINGOOPHORECTOMY  8/03   Lap BSO (R ovarian fibroma) and adhesion lysis  . BOWEL RESECTION  2003   ileocolectomy with anastomosis 2/2 adhesions  . BREAST BIOPSY    . BREAST LUMPECTOMY Right   . BREAST LUMPECTOMY WITH NEEDLE LOCALIZATION AND AXILLARY SENTINEL LYMPH NODE BX Right 04/22/2013   Procedure: BREAST LUMPECTOMY WITH NEEDLE LOCALIZATION AND AXILLARY SENTINEL LYMPH NODE BX;  Surgeon: Stark Klein, MD;  Location: [place];  Service: General;  Laterality: Right;  . CARDIAC CATHETERIZATION     2 stents  . CHOLECYSTECTOMY     . COLONOSCOPY    . HEMICOLECTOMY     R sided hemicolectomy  . HERNIA REPAIR     Ventral hernia repair  . PTCA  4/06    I have reviewed the social history and family history with the patient and they are unchanged from previous note.  ALLERGIES:  is allergic to percocet [oxycodone-acetaminophen]; diazepam; haloperidol lactate; lorazepam; morphine sulfate; propoxyphene hcl; tramadol hcl; anesthetics, halogenated; etomidate; fentanyl; and versed [midazolam].  MEDICATIONS:  Current Outpatient Medications  Medication Sig Dispense Refill  . acetaminophen (TYLENOL) 325 MG tablet Take 650 mg by mouth every 6 (six) hours as needed for mild pain.    Marland Kitchen amLODipine (NORVASC) 10 MG tablet Take 1 tablet (10 mg total) by mouth daily. 30 tablet 0  . atorvastatin (LIPITOR) 40 MG tablet Take 1 tablet (40 mg total) by mouth at bedtime. 90 tablet 3  . carvedilol (COREG) 25 MG tablet Take 25 mg by mouth as directed.    . desloratadine (CLARINEX) 5 MG tablet Take 5 mg by mouth at bedtime as needed (allergies). Reported on 04/11/2015    .  ergocalciferol (VITAMIN D2) 1.25 MG (50000 UT) capsule Take 1 capsule (50,000 Units total) by mouth once a week. X 8 wks & then go to OTC dose of 1000 units daily. 8 capsule 0  . fenofibrate 54 MG tablet Take 54 mg by mouth daily.     . ferrous fumarate (HEMOCYTE - 106 MG FE) 325 (106 Fe) MG TABS tablet Take 1 tablet by mouth as directed.    . furosemide (LASIX) 40 MG tablet Take 40 mg by mouth as directed.    . gabapentin (NEURONTIN) 100 MG capsule Take 1 capsule (100 mg total) by mouth 4 (four) times daily. 120 capsule 3  . hydrALAZINE (APRESOLINE) 10 MG tablet Take 10 mg by mouth as directed.    . hydrOXYzine (ATARAX/VISTARIL) 25 MG tablet Take 25 mg by mouth 2 (two) times daily as needed.    . isosorbide mononitrate (IMDUR) 30 MG 24 hr tablet Take 0.5 tablets (15 mg total) by mouth daily. 45 tablet 1  . linagliptin (TRADJENTA) 5 MG TABS tablet Take 5 mg by mouth daily.     . nitroGLYCERIN (NITROSTAT) 0.4 MG SL tablet Place 1 tablet (0.4 mg total) under the tongue every 5 (five) minutes as needed for chest pain. 25 tablet 3  . pantoprazole (PROTONIX) 40 MG tablet TAKE 1 TABLET BY MOUTH ONCE DAILY 30 tablet 3  . potassium chloride SA (K-DUR,KLOR-CON) 20 MEQ tablet Take 1 tablet (20 mEq total) by mouth daily as needed (when taking lasix). 30 tablet 1  . ULORIC 40 MG tablet Take 1 tablet by mouth daily.     No current facility-administered medications for this visit.     PHYSICAL EXAMINATION: ECOG PERFORMANCE STATUS: 2 - Symptomatic, <50% confined to bed  Vitals:   09/15/18 1147  BP: (!) 144/74  Pulse: 76  Resp: 18  Temp: 98.5 F (36.9 C)  SpO2: 95%   Filed Weights   09/15/18 1147  Weight: 196 lb 3.2 oz (89 kg)    GENERAL:alert, no distress and comfortable SKIN: skin color, texture, turgor are normal, no rashes or significant lesions EYES: normal, Conjunctiva are pink and non-injected, sclera clear  NECK: supple, thyroid normal size, non-tender, without nodularity LYMPH:  no palpable lymphadenopathy in the cervical, axillary  LUNGS: clear to auscultation and percussion with normal breathing effort HEART: regular rate & rhythm and no murmurs and no lower extremity edema ABDOMEN:abdomen soft, non-tender and normal bowel sounds Musculoskeletal:no cyanosis of digits and no clubbing  NEURO: alert & oriented x 3 with fluent speech, no focal motor/sensory deficits  LABORATORY DATA:  I have reviewed the data as listed CBC Latest Ref Rng & Units 09/09/2018 08/18/2018 02/05/2018  WBC 4.0 - 10.5 K/uL 8.1 9.9 16.4(H)  Hemoglobin 12.0 - 15.0 g/dL 9.0(L) 8.5(L) 8.9(L)  Hematocrit 36.0 - 46.0 % 29.5(L) 26.9(L) 28.8(L)  Platelets 150 - 400 K/uL 304 312 342     CMP Latest Ref Rng & Units 09/09/2018 08/18/2018 02/05/2018  Glucose 70 - 99 mg/dL 198(H) 199(H) 214(H)  BUN 8 - 23 mg/dL 58(H) 58(H) 65(H)  Creatinine 0.44 - 1.00 mg/dL 2.45(H) 2.83(H) 2.12(H)  Sodium  135 - 145 mmol/L 140 139 138  Potassium 3.5 - 5.1 mmol/L 4.6 4.5 4.1  Chloride 98 - 111 mmol/L 105 105 105  CO2 22 - 32 mmol/L 25 25 21(L)  Calcium 8.9 - 10.3 mg/dL 9.6 9.1 9.5  Total Protein 6.5 - 8.1 g/dL 8.1 7.7 8.8(H)  Total Bilirubin 0.3 - 1.2 mg/dL 0.3 0.3 0.2(L)  Alkaline Phos 38 - 126 U/L 62 70 108  AST 15 - 41 U/L 14(L) 13(L) 19  ALT 0 - 44 U/L 13 16 18    Diagnosis 09/09/18 Bone Marrow, Aspirate,Biopsy, and Clot, left posterior iliac crest - HYPERCELLULAR BONE MARROW FOR AGE WITH TRILINEAGE HEMATOPOIESIS. - SEE COMMENT. PERIPHERAL BLOOD: - NORMOCYTIC-NORMOCHROMIC ANEMIA.   RADIOGRAPHIC STUDIES: I have personally reviewed the radiological images as listed and agreed with the findings in the report. No results found.   ASSESSMENT & PLAN:  [name] is a 73 y.o. female with   1. Breast cancer of the lower inner quadrant of the right breast, invasive ductal carcinoma with DCIS, pT1bN0Mo, stage IA, ER+ and PR +, HER 2 negative -She was diagnosed in 02/2013 and treated with right lumpectomy, adjuvant radiation andAnastrozolefor 4 years until5/2019.Anastrozole was stopped after she developed septic shock from pneumonia and a cardiac arrest,and we decided not restarted due to the risk of cardiac toxicity. -From a breast cancer standpoint she is clinically doing well. Lab reviewed from 09/09/18, her CBC and CMP are WNL except Hg 9, BG 198, BUN 58, Cr 2.45. Her physical exam and her 03/2018 mammogram were unremarkable. There is no clinical concern for recurrence. -She is almost 6 years out since diagnosis. Continue breast cancer surveillance, Next mammogram in 03/2019 -F/u in 6 months    2. Anemia of chronic disease (CKD), Iron and B12 deficiency -Patient receives a B12 shot every month at PCP's office -Her 04/2018 Colonoscopy with Dr Carlean Purl showed no evidence of GI bleeding or source of bleeding.  -I discussed her anemia is probably related to her CKD, however her  anemia is out of proportion of her stage IV CKD related anemia. She has been having night sweats and skin itching as well lately.    -I discussed her 09/09/18 bone marrow biopsy results, which showed hypercellular marrow, no evidence of MDS or other concerns. Her Cytogenetics is still pending.  -Her ferritin level is on low side 46, given her CKD, I recommend one dose iv feraheme, potential benefit and side effects discussed, especially allergy reactions including anaphylactic reaction, she agrees to proceed. -She will start Aranesp (EPO) injection every 4 weeks. I reviewed side effects of increased risk of blood clots and risk of tumor growth acceleration. Will continue close breast cancer surveillance for recurrence and Will start injection in 09/2018  -Continue OTC iron pill ferrous sulfate 1-2 tab dailyfor irondeficiency   3. Low Back pain, from degenerative arthritis and spinal stenosis - f/u with Orthopedics -She is currently getting injections with orthopedist   4. DM  -managedby PCP -Currently on L[place], tradjentra -Latest Glucose at 198  5. Chronic renal insufficieny, CKD stageIV -She follows up with Dr.Uptonfor herkidney function.  -She knows to avoid nephrotoxins -Her BP and latest glucose has been evaluated. I strongly encouraged her to control this as this will negatively impact her kidney function. -Latest Cr at 2.45, BUN 58, GFR at 19.    6. CAD, HTN -Continue to follow up withDr. Johnsie Cancel -BP QQ229/79 today (09/15/18), stable.   7. Genetics, results negative  8. Bone Health  -DEXA from 04/2018 shows slightly improved normal bone density with lowest T-score of 0.9 at forearm radius -Her VitD level is low. She will continue 1000u daily.    9. Left Renal Vein Thrombosisin 07/2017 -Likely provoked from acute illnessand prior SBO -She stopped Xarelto in 01/2018 -Her hypercoagulopathy work-up in November 2019 was negative, I reviewed with her  10.  History  of pneumoniaand cardiac arrestin 07/2017 -10/16/17 CXR showed residual peribronchial disease,atelectasis, scarring or potentially pulmonary nodules. -Her latest CT chest from 01/27/18 showsNo acute findings. Scarring in the left upper lobe.   PLAN -Labs reviewed, will proceed with IV Feraheme today  -Continue oral ferrous sulfate daily  -start monthly Aranesp injection in 09/2018 if Hg<11.0  -Lab and f/u in 6 months   No problem-specific Assessment & Plan notes found for this encounter.   No orders of the defined types were placed in this encounter.  All questions were answered. The patient knows to call the clinic with any problems, questions or concerns. No barriers to learning was detected. I spent 20 minutes counseling the patient face to face. The total time spent in the appointment was 25 minutes and more than 50% was on counseling and review of test results     Truitt Merle, MD 09/15/2018   I, Joslyn Devon, am acting as scribe for Truitt Merle, MD.   I have reviewed the above documentation for accuracy and completeness, and I agree with the above.

## 2018-09-15 ENCOUNTER — Inpatient Hospital Stay: Payer: Medicare Other

## 2018-09-15 ENCOUNTER — Telehealth: Payer: Self-pay | Admitting: Hematology

## 2018-09-15 ENCOUNTER — Other Ambulatory Visit: Payer: Self-pay

## 2018-09-15 ENCOUNTER — Inpatient Hospital Stay (HOSPITAL_BASED_OUTPATIENT_CLINIC_OR_DEPARTMENT_OTHER): Payer: Medicare Other | Admitting: Hematology

## 2018-09-15 VITALS — BP 144/74 | HR 76 | Temp 98.5°F | Resp 18 | Ht 65.0 in | Wt 196.2 lb

## 2018-09-15 VITALS — BP 135/63 | HR 68 | Temp 97.9°F | Resp 18

## 2018-09-15 DIAGNOSIS — I2510 Atherosclerotic heart disease of native coronary artery without angina pectoris: Secondary | ICD-10-CM

## 2018-09-15 DIAGNOSIS — D638 Anemia in other chronic diseases classified elsewhere: Secondary | ICD-10-CM | POA: Diagnosis not present

## 2018-09-15 DIAGNOSIS — C50311 Malignant neoplasm of lower-inner quadrant of right female breast: Secondary | ICD-10-CM

## 2018-09-15 DIAGNOSIS — Z8674 Personal history of sudden cardiac arrest: Secondary | ICD-10-CM

## 2018-09-15 DIAGNOSIS — D631 Anemia in chronic kidney disease: Secondary | ICD-10-CM

## 2018-09-15 DIAGNOSIS — N184 Chronic kidney disease, stage 4 (severe): Secondary | ICD-10-CM

## 2018-09-15 DIAGNOSIS — E1122 Type 2 diabetes mellitus with diabetic chronic kidney disease: Secondary | ICD-10-CM

## 2018-09-15 DIAGNOSIS — D509 Iron deficiency anemia, unspecified: Secondary | ICD-10-CM

## 2018-09-15 DIAGNOSIS — Z853 Personal history of malignant neoplasm of breast: Secondary | ICD-10-CM | POA: Diagnosis not present

## 2018-09-15 DIAGNOSIS — E538 Deficiency of other specified B group vitamins: Secondary | ICD-10-CM

## 2018-09-15 DIAGNOSIS — I1310 Hypertensive heart and chronic kidney disease without heart failure, with stage 1 through stage 4 chronic kidney disease, or unspecified chronic kidney disease: Secondary | ICD-10-CM

## 2018-09-15 DIAGNOSIS — R0602 Shortness of breath: Secondary | ICD-10-CM

## 2018-09-15 MED ORDER — SODIUM CHLORIDE 0.9 % IV SOLN
510.0000 mg | Freq: Once | INTRAVENOUS | Status: AC
  Administered 2018-09-15: 510 mg via INTRAVENOUS
  Filled 2018-09-15: qty 17

## 2018-09-15 MED ORDER — SODIUM CHLORIDE 0.9 % IV SOLN
Freq: Once | INTRAVENOUS | Status: AC
  Administered 2018-09-15: 13:00:00 via INTRAVENOUS
  Filled 2018-09-15: qty 250

## 2018-09-15 NOTE — Telephone Encounter (Signed)
Scheduled appt per 6/29 los. Printed and mailed calendar.

## 2018-09-15 NOTE — Patient Instructions (Signed)

## 2018-09-16 ENCOUNTER — Encounter (HOSPITAL_COMMUNITY): Payer: Self-pay | Admitting: Hematology

## 2018-09-16 ENCOUNTER — Encounter: Payer: Self-pay | Admitting: Hematology

## 2018-10-07 ENCOUNTER — Other Ambulatory Visit: Payer: Self-pay

## 2018-10-07 ENCOUNTER — Encounter: Payer: Medicare Other | Attending: Physical Medicine & Rehabilitation | Admitting: Physical Medicine & Rehabilitation

## 2018-10-07 ENCOUNTER — Encounter: Payer: Self-pay | Admitting: Physical Medicine & Rehabilitation

## 2018-10-07 DIAGNOSIS — M48062 Spinal stenosis, lumbar region with neurogenic claudication: Secondary | ICD-10-CM | POA: Diagnosis not present

## 2018-10-07 DIAGNOSIS — M5417 Radiculopathy, lumbosacral region: Secondary | ICD-10-CM | POA: Insufficient documentation

## 2018-10-07 DIAGNOSIS — M47816 Spondylosis without myelopathy or radiculopathy, lumbar region: Secondary | ICD-10-CM | POA: Diagnosis not present

## 2018-10-07 MED ORDER — GABAPENTIN 300 MG PO CAPS: 300.0000 mg | ORAL_CAPSULE | Freq: Every day | ORAL | 1 refills | Status: DC

## 2018-10-07 NOTE — Patient Instructions (Addendum)
Will do Right L5 ESI next month

## 2018-10-07 NOTE — Progress Notes (Signed)
Subjective:    Patient ID: [name], female    DOB: Mar 08, 1946, 73 y.o.   MRN: [id number]  HPI Bilateral S1 ESI 5/18, Left side still doing ok. Right side LE pain mainly when trying to sleep at night  Pain Inventory Average Pain 9 Pain Right Now 5 My pain is .  In the last 24 hours, has pain interfered with the following? General activity 6 Relation with others 4 Enjoyment of life 4 What TIME of day is your pain at its worst? . Sleep (in general) Fair  Pain is worse with: walking, standing and some activites Pain improves with: rest and medication Relief from Meds: 2  Mobility walk without assistance ability to climb steps?  no do you drive?  yes  Function not employed: date last employed .  Neuro/Psych numbness spasms  Prior Studies Any changes since last visit?  no  Physicians involved in your care Any changes since last visit?  no   Family History  Problem Relation Age of Onset   Pancreatic cancer Brother 26   Breast cancer Mother 52   Lung cancer Maternal Aunt    Breast cancer Maternal Aunt    Prostate cancer Maternal Uncle    Heart attack Maternal Grandmother    Breast cancer Maternal Grandmother    Colon cancer Maternal Grandmother    Clotting disorder Maternal Grandmother    Diabetes Maternal Grandmother    Kidney failure Maternal Aunt    Hypertension Daughter    Social History   Socioeconomic History   Marital status: Legally Separated    Spouse name: Not on file   Number of children: 5   Years of education: Not on file   Highest education level: Not on file  Occupational History   Occupation: retired    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[place] resource strain: Not on file   Food insecurity    Worry: Not on file    Inability: Not on file   Transportation needs    Medical: Not on file    Non-medical: Not on file  Tobacco Use   Smoking status: Never Smoker   Smokeless tobacco: Never  Used  Substance and Sexual Activity   Alcohol use: No    Alcohol/week: 0.0 standard drinks   Drug use: No   Sexual activity: Not on file  Lifestyle   Physical activity    Days per week: Not on file    Minutes per session: Not on file   Stress: Not on file  Relationships   Social connections    Talks on phone: Not on file    Gets together: Not on file    Attends religious service: Not on file    Active member of club or organization: Not on file    Attends meetings of clubs or organizations: Not on file    Relationship status: Not on file  Other Topics Concern   Not on file  Social History Narrative   She is separated 2 sons 3 daughters   She has grandchildren and great-grandchildren   She is retired   Never smoker no tobacco now no drugs no alcohol no caffeine   Past Surgical History:  Procedure Laterality Date   ABDOMINAL HYSTERECTOMY     BILATERAL SALPINGOOPHORECTOMY  8/03   Lap BSO (R ovarian fibroma) and adhesion lysis   BOWEL RESECTION  2003   ileocolectomy with anastomosis 2/2 adhesions   BREAST BIOPSY     BREAST  LUMPECTOMY Right    BREAST LUMPECTOMY WITH NEEDLE LOCALIZATION AND AXILLARY SENTINEL LYMPH NODE BX Right 04/22/2013   Procedure: BREAST LUMPECTOMY WITH NEEDLE LOCALIZATION AND AXILLARY SENTINEL LYMPH NODE BX;  Surgeon: Stark Klein, MD;  Location: [place];  Service: General;  Laterality: Right;   CARDIAC CATHETERIZATION     2 stents   CHOLECYSTECTOMY     COLONOSCOPY     HEMICOLECTOMY     R sided hemicolectomy   HERNIA REPAIR     Ventral hernia repair   PTCA  4/06   Past Medical History:  Diagnosis Date   Abdominal discomfort    Chronic N/V/D. Presumptive dx Crohn's dx per elevated p ANCA. Failed Entocort and Pentasa. Sep 2003 - ileocolectomy c anastomosis per Dr Deon Pilling 2/2 adhesions - path was hegative for Crohns. EGD, Sm bowel follow through (11/03), and an eteroclysis (10/03) were unrevealing. Cuases hypomag and  hypocalcemia.   Adnexal mass 8/03   s/p lap BSO (R ovarian fibroma) & lysis of adhesions   Allergy    Seasonal   Anemia    Multifactorial. Baseline HgB 10-11 ish. B12 def - 150 in 3/10. Fe Def - ferritin 35 3/10. Both are being repleted.   Breast cancer ([place]) 03/16/13   right, 5 o'clock   CAD (coronary artery disease) 1996   1996 - PTCA and angioplasty diagonal branch. 2000 - Rotoblator & angiopllasty of diagonal. 2006 - subendocardial AMI, DES to proximal LAD.Marland Kitchen Also had 90% stenosis in distal apical LAD. EF 55 with apical hypokinesis. Indefinite ASA and Plavix.   CHF (congestive heart failure) ([facility])    Chronic kidney disease    Chronic renal insuff baseline Cr 1.2 - 1.4 ish.   Chronic pain    CT 10/10 = Spinal stenosis L2 - S1.   Diabetes mellitus    Insulin dependent   Hx of radiation therapy 06/02/13- 07/16/13   right rbeast 4500 cGy 25 sessions, right breast boost 1600 cGy in 8 sessions   Hyperlipidemia    Managed with both a statin and Welchol. Welchol stopped 2014 2/2 cost and started on fenofibrate    Hypertension    2006 B renal arteries patent. 2003 MRA - no RAS. 2003 pheo W/U Dr Hassell Done reportedly negative.   Hypoxia 07/23/2017   Lupus ([place])    Personal history of radiation therapy    RBBB    SBO (small bowel obstruction) ([place]) 09/17/2017   Stroke ([place])    Incidental finding MRI 2002 L lacunar infarct   Wears dentures    top   BP 115/67    Pulse 75    Temp 97.8 F (36.6 C)    Ht 5' 5"  (1.651 m)    Wt 194 lb (88 kg)    SpO2 95%    BMI 32.28 kg/m   Opioid Risk Score:   Fall Risk Score:  `1  Depression screen PHQ 2/9  Depression screen [facility] 2/9 07/23/2017 04/18/2017 12/20/2016 10/16/2016 10/11/2016 06/11/2016 05/24/2016  Decreased Interest 0 0 0 0 0 0 0  Down, Depressed, Hopeless 0 0 0 0 0 0 0  PHQ - 2 Score 0 0 0 0 0 0 0  Some recent data might be hidden     Review of Systems  Constitutional: Negative.   HENT: Negative.   Eyes: Negative.     Respiratory: Positive for shortness of breath.   Cardiovascular: Negative.   Gastrointestinal: Negative.   Endocrine: Negative.   Genitourinary: Negative.   Musculoskeletal: Positive for  arthralgias and myalgias.  Skin: Negative.   Allergic/Immunologic: Negative.   Neurological: Positive for numbness.  Hematological: Negative.   Psychiatric/Behavioral: Negative.   All other systems reviewed and are negative.      Objective:   Physical Exam Musculoskeletal:     Lumbar back: She exhibits decreased range of motion and tenderness.     Comments: Pain with forward flexion extension lateral bending and rotation.  Range of motion reduced approximately 50%  There is mild tenderness palpation at the L4 paraspinals more on the right side than on the left side.  Neurological:     Mental Status: She is alert and oriented to person, place, and time.     Sensory: Sensory deficit present.     Motor: No atrophy.     Gait: Gait normal.     Deep Tendon Reflexes:     Reflex Scores:      Patellar reflexes are 2+ on the right side and 2+ on the left side.      Achilles reflexes are 1+ on the right side and 1+ on the left side.    Comments: Motor strength is 5/5 bilateral hip flexor knee extensor 4/5 right ankle dorsiflexor secondary pain 5/5 in left side Positive straight leg raise on the right side     Left sided sensation normal Right-sided sensation reduced right L4 right L5 and right S1 dermatome distribution        Assessment & Plan:  1.  Chronic right lower extremity radiculopathy.  She had good relief of her left S1 radiculopathy with left S1 ESI.  Patient also had short-term relief with right S1 transforaminal ESI.  Reviewed MRI, also correlated exam with imaging.  Patient may benefit from right L5 ESI more so than S1 we will set this up for next month.

## 2018-10-09 ENCOUNTER — Ambulatory Visit (INDEPENDENT_AMBULATORY_CARE_PROVIDER_SITE_OTHER): Payer: Medicare Other | Admitting: Cardiology

## 2018-10-09 ENCOUNTER — Encounter: Payer: Self-pay | Admitting: Cardiology

## 2018-10-09 ENCOUNTER — Telehealth: Payer: Self-pay | Admitting: Cardiology

## 2018-10-09 ENCOUNTER — Other Ambulatory Visit: Payer: Self-pay

## 2018-10-09 VITALS — BP 146/56 | HR 76 | Ht 65.0 in | Wt 192.0 lb

## 2018-10-09 DIAGNOSIS — I10 Essential (primary) hypertension: Secondary | ICD-10-CM

## 2018-10-09 DIAGNOSIS — R0602 Shortness of breath: Secondary | ICD-10-CM | POA: Diagnosis not present

## 2018-10-09 DIAGNOSIS — I2510 Atherosclerotic heart disease of native coronary artery without angina pectoris: Secondary | ICD-10-CM | POA: Diagnosis not present

## 2018-10-09 DIAGNOSIS — I209 Angina pectoris, unspecified: Secondary | ICD-10-CM | POA: Diagnosis not present

## 2018-10-09 DIAGNOSIS — D509 Iron deficiency anemia, unspecified: Secondary | ICD-10-CM

## 2018-10-09 MED ORDER — HYDRALAZINE HCL 10 MG PO TABS: 10.0000 mg | ORAL_TABLET | Freq: Two times a day (BID) | ORAL | 3 refills | Status: DC

## 2018-10-09 NOTE — Telephone Encounter (Signed)

## 2018-10-09 NOTE — Progress Notes (Signed)
Cardiology Office Note   Date:  10/09/2018   ID:  [name], DOB 1945/08/18, MRN [id number]  PCP:  Seward Carol, MD  Cardiologist:  Dr. Johnsie Cancel     Chief Complaint  Patient presents with   Shortness of Breath      History of Present Illness: [name] is a 73 y.o. female who presents for follow up of DOE and angina  She was placed back on lasix 40 mg daily and was seen June 5  CAD with pevious rotoblator to the LAD in 2000. Myovue on 11/2006 showed mild apical thinning and was low risk.   May 2019 admitted with sepsis syndrome pneumonia She had alveolar hemorrhage and brief PEA Arrest post bronchoscopy and was intubated for days and d/c to inpatient rehab   Breast cancer and sees Dr Burr Medico was on Arimidex but hospitalized in July with SBO pneumonia and pancreatic cyst. Noted to have renal vein thrombosis and placed on xarelto  For 6 months   Last TTE done 07/27/17 reviewed EF 55-60% Moderate MR  Estimated PA pressure 64 mmHg  She has some pain in chest with activity and after eating Not severe and only lasts minutes Nitro gives her headache and "she hasn't needed it"  She is anemic with Hgb of 8.5 Iron level 55 Vitd 14  Her Cr is climbing 2.83 In July 2019 was 1.95 Last labs August 18 2018  08/22/18 She has had increased SOB and lower ext edema. She has had to prop up on pillows. Wt is up 10 lbs. lasix increased to 80 mg X 3 days then back to usual.   Echo with EF 60-65%  Cr today at 2.45 and stable to improved. Hgb 9.0  Vit D level was low   09/09/18 overall she is better, no edema and continues lasix 40 mg daily.  But with exertions she still has dyspnea and some chest pressure.  She rests for 5 min and resolves.    Imdur was added for her CHF lasix 40 mg daily   anemia and had bone marrow aspiration on 09/09/18 on aranesp, IV feraheme and oral ferrous sulfate.  B 12 shot every month.  BM which showed hypercellular marrow, no evidence of MDS  or other concerns  BP with oncology 144/74   Today no longer chest pressure but DOE continues,  Lower ext edema improved.  BP still elevated will increase hydralazine to BID.  May need higher dose.  Marland Kitchen     Past Medical History:  Diagnosis Date   Abdominal discomfort    Chronic N/V/D. Presumptive dx Crohn's dx per elevated p ANCA. Failed Entocort and Pentasa. Sep 2003 - ileocolectomy c anastomosis per Dr Deon Pilling 2/2 adhesions - path was hegative for Crohns. EGD, Sm bowel follow through (11/03), and an eteroclysis (10/03) were unrevealing. Cuases hypomag and hypocalcemia.   Adnexal mass 8/03   s/p lap BSO (R ovarian fibroma) & lysis of adhesions   Allergy    Seasonal   Anemia    Multifactorial. Baseline HgB 10-11 ish. B12 def - 150 in 3/10. Fe Def - ferritin 35 3/10. Both are being repleted.   Breast cancer ([place]) 03/16/13   right, 5 o'clock   CAD (coronary artery disease) 1996   1996 - PTCA and angioplasty diagonal branch. 2000 - Rotoblator & angiopllasty of diagonal. 2006 - subendocardial AMI, DES to proximal LAD.Marland Kitchen Also had 90% stenosis in distal apical LAD. EF 55 with apical hypokinesis. Indefinite ASA and Plavix.  CHF (congestive heart failure) (HCC)    Chronic kidney disease    Chronic renal insuff baseline Cr 1.2 - 1.4 ish.   Chronic pain    CT 10/10 = Spinal stenosis L2 - S1.   Diabetes mellitus    Insulin dependent   Hx of radiation therapy 06/02/13- 07/16/13   right rbeast 4500 cGy 25 sessions, right breast boost 1600 cGy in 8 sessions   Hyperlipidemia    Managed with both a statin and Welchol. Welchol stopped 2014 2/2 cost and started on fenofibrate    Hypertension    2006 B renal arteries patent. 2003 MRA - no RAS. 2003 pheo W/U Dr Hassell Done reportedly negative.   Hypoxia 07/23/2017   Lupus ([place])    Personal history of radiation therapy    RBBB    SBO (small bowel obstruction) ([place]) 09/17/2017   Stroke ([place])    Incidental finding MRI 2002 L lacunar infarct    Wears dentures    top    Past Surgical History:  Procedure Laterality Date   ABDOMINAL HYSTERECTOMY     BILATERAL SALPINGOOPHORECTOMY  8/03   Lap BSO (R ovarian fibroma) and adhesion lysis   BOWEL RESECTION  2003   ileocolectomy with anastomosis 2/2 adhesions   BREAST BIOPSY     BREAST LUMPECTOMY Right    BREAST LUMPECTOMY WITH NEEDLE LOCALIZATION AND AXILLARY SENTINEL LYMPH NODE BX Right 04/22/2013   Procedure: BREAST LUMPECTOMY WITH NEEDLE LOCALIZATION AND AXILLARY SENTINEL LYMPH NODE BX;  Surgeon: Stark Klein, MD;  Location: [facility];  Service: General;  Laterality: Right;   CARDIAC CATHETERIZATION     2 stents   CHOLECYSTECTOMY     COLONOSCOPY     HEMICOLECTOMY     R sided hemicolectomy   HERNIA REPAIR     Ventral hernia repair   PTCA  4/06     Current Outpatient Medications  Medication Sig Dispense Refill   acetaminophen (TYLENOL) 325 MG tablet Take 650 mg by mouth every 6 (six) hours as needed for mild pain.     amLODipine (NORVASC) 10 MG tablet Take 1 tablet (10 mg total) by mouth daily. 30 tablet 0   atorvastatin (LIPITOR) 40 MG tablet Take 1 tablet (40 mg total) by mouth at bedtime. 90 tablet 3   carvedilol (COREG) 25 MG tablet Take 25 mg by mouth as directed.     desloratadine (CLARINEX) 5 MG tablet Take 5 mg by mouth at bedtime as needed (allergies). Reported on 04/11/2015     ergocalciferol (VITAMIN D2) 1.25 MG (50000 UT) capsule Take 1 capsule (50,000 Units total) by mouth once a week. X 8 wks & then go to OTC dose of 1000 units daily. 8 capsule 0   fenofibrate 54 MG tablet Take 54 mg by mouth daily.      ferrous fumarate (HEMOCYTE - 106 MG FE) 325 (106 Fe) MG TABS tablet Take 1 tablet by mouth as directed.     furosemide (LASIX) 40 MG tablet Take 40 mg by mouth as directed.     gabapentin (NEURONTIN) 300 MG capsule Take 1 capsule (300 mg total) by mouth at bedtime. 30 capsule 1   hydrOXYzine (ATARAX/VISTARIL) 25 MG tablet Take  25 mg by mouth 2 (two) times daily as needed.     isosorbide mononitrate (IMDUR) 30 MG 24 hr tablet Take 0.5 tablets (15 mg total) by mouth daily. 45 tablet 1   linagliptin (TRADJENTA) 5 MG TABS tablet Take 5 mg by mouth daily.  nitroGLYCERIN (NITROSTAT) 0.4 MG SL tablet Place 1 tablet (0.4 mg total) under the tongue every 5 (five) minutes as needed for chest pain. 25 tablet 3   potassium chloride SA (K-DUR,KLOR-CON) 20 MEQ tablet Take 1 tablet (20 mEq total) by mouth daily as needed (when taking lasix). 30 tablet 1   ULORIC 40 MG tablet Take 1 tablet by mouth daily.     hydrALAZINE (APRESOLINE) 10 MG tablet Take 1 tablet (10 mg total) by mouth 2 (two) times a day. 180 tablet 3   No current facility-administered medications for this visit.     Allergies:   Percocet [oxycodone-acetaminophen]; Diazepam; Haloperidol lactate; Lorazepam; Morphine sulfate; Propoxyphene hcl; Tramadol hcl; Anesthetics, halogenated; Etomidate; Fentanyl; and Versed [midazolam]    Social History:  The patient  reports that she has never smoked. She has never used smokeless tobacco. She reports that she does not drink alcohol or use drugs.   Family History:  The patient's family history includes Breast cancer in her maternal aunt and maternal grandmother; Breast cancer (age of onset: 31) in her mother; Clotting disorder in her maternal grandmother; Colon cancer in her maternal grandmother; Diabetes in her maternal grandmother; Heart attack in her maternal grandmother; Hypertension in her daughter; Kidney failure in her maternal aunt; Lung cancer in her maternal aunt; Pancreatic cancer (age of onset: 54) in her brother; Prostate cancer in her maternal uncle.    ROS:  General:no colds or fevers,  Weight down 2 lbs. From last visit.  Skin:no rashes or ulcers HEENT:no blurred vision, no congestion CV:see HPI PUL:see HPI GI:no diarrhea constipation or melena, no indigestion GU:no hematuria, no dysuria MS:no joint  pain, no claudication Neuro:no syncope, no lightheadedness Endo:+ diabetes, no thyroid disease  Wt Readings from Last 3 Encounters:  10/09/18 192 lb (87.1 kg)  10/07/18 194 lb (88 kg)  09/15/18 196 lb 3.2 oz (89 kg)     PHYSICAL EXAM: VS:  BP (!) 146/56    Pulse 76    Ht 5' 5"  (1.651 m)    Wt 192 lb (87.1 kg)    SpO2 98%    BMI 31.95 kg/m  , BMI Body mass index is 31.95 kg/m. General:Pleasant affect, NAD Skin:Warm and dry, brisk capillary refill HEENT:normocephalic, sclera clear, mucus membranes moist Neck:supple, no JVD, no bruits  Heart:S1S2 RRR without murmur, gallup, rub or click Lungs:clear without rales, rhonchi, or wheezes QBH:ALPF, non tender, + BS, do not palpate liver spleen or masses Ext:no lower ext edema, 2+ pedal pulses, 2+ radial pulses Neuro:alert and oriented, Vayda, follows commands, + facial symmetry    EKG:  EKG is NOT ordered today.   Recent Labs: 09/09/2018: ALT 13; BUN 58; Creatinine, Ser 2.45; Hemoglobin 9.0; Platelets 304; Potassium 4.6; Sodium 140    Lipid Panel    Component Value Date/Time   CHOL 183 07/21/2015 1106   TRIG 107 07/24/2017 1444   HDL 47 07/21/2015 1106   CHOLHDL 3.9 07/21/2015 1106   CHOLHDL 3.8 09/10/2013 1041   VLDL 56 (H) 09/10/2013 1041   LDLCALC 88 07/21/2015 1106   LDLDIRECT 55 07/01/2012 1016       Other studies Reviewed: Additional studies/ records that were reviewed today include: . Echo 09/04/18   IMPRESSIONS   1. The left ventricle has normal systolic function with an ejection fraction of 60-65%. The cavity size was normal. There is moderate asymmetric left ventricular hypertrophy. Left ventricular diastolic Doppler parameters are consistent with impaired  relaxation. Indeterminate filling pressures The E/e' is 8-15.  No evidence of left ventricular regional wall motion abnormalities. 2. The right ventricle has normal systolic function. The cavity was normal. There is no increase in right ventricular wall  thickness. 3. The mitral valve is abnormal. Mild thickening of the mitral valve leaflet. 4. The tricuspid valve is grossly normal. 5. The aortic valve is grossly normal. No stenosis of the aortic valve. 6. The average left ventricular global longitudinal strain is -19.6 %.  SUMMARY  LVEF 60-65%, moderate assymetric septal hypertrophy, grade 1 DD, indeterminate LV filling pressure, normal GLS at -20%, trivial MR, normal LA size, trivial TR, normal RVSP, normal IVC FINDINGS Left Ventricle: The left ventricle has normal systolic function, with an ejection fraction of 60-65%. The cavity size was normal. There is moderate asymmetric left ventricular hypertrophy. Left ventricular diastolic Doppler parameters are consistent  with impaired relaxation. Indeterminate filling pressures The E/e' is 8-15. No evidence of left ventricular regional wall motion abnormalities.. The average left ventricular global longitudinal strain is -19.6 %.  Right Ventricle: The right ventricle has normal systolic function. The cavity was normal. There is no increase in right ventricular wall thickness.  Left Atrium: Left atrial size was normal in size.  Right Atrium: Right atrial size was normal in size. Right atrial pressure is estimated at 10 mmHg.  Interatrial Septum: No atrial level shunt detected by color flow Doppler.  Pericardium: There is no evidence of pericardial effusion.  Mitral Valve: The mitral valve is abnormal. Mild thickening of the mitral valve leaflet. Mitral valve regurgitation is trivial by color flow Doppler.  Tricuspid Valve: The tricuspid valve is grossly normal. Tricuspid valve regurgitation is mild by color flow Doppler.  Aortic Valve: The aortic valve is grossly normal Aortic valve regurgitation was not visualized by color flow Doppler. There is No stenosis of the aortic valve.  Pulmonic Valve: The pulmonic valve was grossly normal. Pulmonic valve regurgitation is not  visualized by color flow Doppler.  Venous: The inferior vena cava is normal in size with greater than 50% respiratory variability.  nuc study 02/27/18 Addendum by Sueanne Margarita, MD on Sun Mar 02, 2018 7:58 PM   The left ventricular ejection fraction is moderately decreased (30-44%).  Nuclear stress EF: 37%.  There was no ST segment deviation noted during stress.  Study is of very poor quality due to evere extracardiac tracer uptake  Recommend repeat stress images or consider coronary CTA   Finalized by Sueanne Margarita, MD on Sun Mar 02, 2018 7:57 PM   The left ventricular ejection fraction is moderately decreased (30-44%).  Nuclear stress EF: 37%.  There was no ST segment deviation noted during stress.     ASSESSMENT AND PLAN:  1.  Angina resolved on imdur 2. DOE continues and may be component of angina - vs BP, would like BP lower will increase her hydralazine from once a day to twice per day.  She will call her BP next week goal 245-809 systolic.  Again discussed no cardiac cath CTA due to renal function.   3.  Diastolic HF chronic stable.  Her anemia plays a role but with IRON and arenesp this may improve. 4.  Hx of CAD with PCI in 2000 to LAD,  CAD seen on CT of chest as well.   5.  Anemia followed by oncology may be due to chronic renal disease.  Follow up with Dr. Johnsie Cancel in Sept.     Current medicines are reviewed with the patient today.  The patient Has no  concerns regarding medicines.  The following changes have been made:  See above Labs/ tests ordered today include:see above  Disposition:   FU:  see above  Signed, Cecilie Kicks, NP  10/09/2018 3:02 PM    [facility] [address], [place] [place], [place] Phone: [phone]; Fax: [phone]

## 2018-10-09 NOTE — Patient Instructions (Signed)
Medication Instructions:  Your physician has recommended you make the following change in your medication:   1. INCREASE HYDRALAZINE TO 10 MG TWICE DAILY.  If you need a refill on your cardiac medications before your next appointment, please call your pharmacy.   Lab work: NONE If you have labs (blood work) drawn today and your tests are completely normal, you will receive your results only by: Marland Kitchen MyChart Message (if you have MyChart) OR . A paper copy in the mail If you have any lab test that is abnormal or we need to change your treatment, we will call you to review the results.  Testing/Procedures: NONE  Follow-Up: . Your physician recommends that you keep your scheduled follow-up appointment with Dr. Johnsie Cancel.   Any Other Special Instructions Will Be Listed Below (If Applicable). Please check your blood pressure 1-2 times per day for 1 week and call our office with readings. [phone]

## 2018-10-15 ENCOUNTER — Telehealth: Payer: Self-pay

## 2018-10-15 ENCOUNTER — Inpatient Hospital Stay: Payer: Medicare Other | Attending: Internal Medicine

## 2018-10-15 ENCOUNTER — Other Ambulatory Visit: Payer: Self-pay | Admitting: Hematology

## 2018-10-15 ENCOUNTER — Inpatient Hospital Stay: Payer: Medicare Other

## 2018-10-15 ENCOUNTER — Telehealth: Payer: Self-pay | Admitting: Cardiovascular Disease

## 2018-10-15 ENCOUNTER — Other Ambulatory Visit: Payer: Self-pay

## 2018-10-15 DIAGNOSIS — Z853 Personal history of malignant neoplasm of breast: Secondary | ICD-10-CM | POA: Diagnosis not present

## 2018-10-15 DIAGNOSIS — D638 Anemia in other chronic diseases classified elsewhere: Secondary | ICD-10-CM | POA: Diagnosis not present

## 2018-10-15 DIAGNOSIS — D508 Other iron deficiency anemias: Secondary | ICD-10-CM

## 2018-10-15 LAB — CBC WITH DIFFERENTIAL/PLATELET
Abs Immature Granulocytes: 0.04 10*3/uL (ref 0.00–0.07)
Basophils Absolute: 0 10*3/uL (ref 0.0–0.1)
Basophils Relative: 0 %
Eosinophils Absolute: 0.1 10*3/uL (ref 0.0–0.5)
Eosinophils Relative: 1 %
HCT: 30.9 % — ABNORMAL LOW (ref 36.0–46.0)
Hemoglobin: 9.8 g/dL — ABNORMAL LOW (ref 12.0–15.0)
Immature Granulocytes: 1 %
Lymphocytes Relative: 21 %
Lymphs Abs: 1.6 10*3/uL (ref 0.7–4.0)
MCH: 26.6 pg (ref 26.0–34.0)
MCHC: 31.7 g/dL (ref 30.0–36.0)
MCV: 84 fL (ref 80.0–100.0)
Monocytes Absolute: 0.4 10*3/uL (ref 0.1–1.0)
Monocytes Relative: 6 %
Neutro Abs: 5.3 10*3/uL (ref 1.7–7.7)
Neutrophils Relative %: 71 %
Platelets: 294 10*3/uL (ref 150–400)
RBC: 3.68 MIL/uL — ABNORMAL LOW (ref 3.87–5.11)
RDW: 14.6 % (ref 11.5–15.5)
WBC: 7.5 10*3/uL (ref 4.0–10.5)
nRBC: 0 % (ref 0.0–0.2)

## 2018-10-15 LAB — COMPREHENSIVE METABOLIC PANEL
ALT: 15 U/L (ref 0–44)
AST: 15 U/L (ref 15–41)
Albumin: 3.9 g/dL (ref 3.5–5.0)
Alkaline Phosphatase: 59 U/L (ref 38–126)
Anion gap: 13 (ref 5–15)
BUN: 90 mg/dL — ABNORMAL HIGH (ref 8–23)
CO2: 22 mmol/L (ref 22–32)
Calcium: 9.7 mg/dL (ref 8.9–10.3)
Chloride: 103 mmol/L (ref 98–111)
Creatinine, Ser: 4.42 mg/dL (ref 0.44–1.00)
GFR calc Af Amer: 11 mL/min — ABNORMAL LOW (ref >60)
GFR calc non Af Amer: 9 mL/min — ABNORMAL LOW (ref >60)
Glucose, Bld: 246 mg/dL — ABNORMAL HIGH (ref 70–99)
Potassium: 4.2 mmol/L (ref 3.5–5.1)
Sodium: 138 mmol/L (ref 135–145)
Total Bilirubin: 0.3 mg/dL (ref 0.3–1.2)
Total Protein: 8.4 g/dL — ABNORMAL HIGH (ref 6.5–8.1)

## 2018-10-15 LAB — IRON AND TIBC
Iron: 68 ug/dL (ref 41–142)
Saturation Ratios: 17 % — ABNORMAL LOW (ref 21–57)
TIBC: 403 ug/dL (ref 236–444)
UIBC: 334 ug/dL (ref 120–384)

## 2018-10-15 LAB — FERRITIN: Ferritin: 439 ng/mL — ABNORMAL HIGH (ref 11–307)

## 2018-10-15 NOTE — Telephone Encounter (Signed)
New Message     Pt went for blood work this morning and they told her to stop taking the Lasix     Please call

## 2018-10-15 NOTE — Telephone Encounter (Signed)
Spoke with patient regarding her lab results.  Creatinine high at 4.42.  Patient does state she takes Lasix every day.  Per Dr. Burr Medico she needs to follow up with her cardiologist.  Explained that the number is way too high (kidney function) and her PCP. She states she will call them today.  Also she wants to keep her appointment in 4 weeks and not come in sooner.

## 2018-10-15 NOTE — Progress Notes (Signed)
Spoke with DR Burr Medico there was no orders for injection she said PT needed a prior auth for medication but her labs looked good today. Instructed me to inform PT that she wouldn't receive injection today and that someone would reach out to reschedule in 2-3 weeks.

## 2018-10-15 NOTE — Telephone Encounter (Signed)
Pt is calling our office about her labs and stopping lasix. Looks Like these were done at [facility], Dr. Burr Medico. I believe this is where the call needs to go.

## 2018-10-15 NOTE — Telephone Encounter (Signed)
CRITICAL VALUE STICKER  CRITICAL VALUE:  Creatinine 4.42  RECEIVER (on-site recipient of call): Valda Favia RN   DATE & TIME NOTIFIED:  10/15/2018 1136  MESSENGER (representative from lab):  Rosann Auerbach  MD NOTIFIED: Dr. Burr Medico  TIME OF NOTIFICATION: 1138  RESPONSE: Call to patient regarding medications, f/u with cardiologist and PCP (see telephone note).

## 2018-10-16 ENCOUNTER — Other Ambulatory Visit: Payer: Self-pay | Admitting: Nephrology

## 2018-10-16 DIAGNOSIS — N1831 Chronic kidney disease, stage 3a: Secondary | ICD-10-CM

## 2018-10-17 ENCOUNTER — Ambulatory Visit
Admission: RE | Admit: 2018-10-17 | Discharge: 2018-10-17 | Disposition: A | Discharge status: Home / Self Care | Payer: Medicare Other | Source: Ambulatory Visit | Attending: Nephrology | Admitting: Nephrology

## 2018-10-17 ENCOUNTER — Other Ambulatory Visit: Payer: Self-pay | Admitting: Nephrology

## 2018-10-17 DIAGNOSIS — N1831 Chronic kidney disease, stage 3a: Secondary | ICD-10-CM

## 2018-10-20 ENCOUNTER — Other Ambulatory Visit: Payer: Medicare Other

## 2018-11-06 NOTE — Progress Notes (Signed)
Cardiology Office Note   Date:  11/18/2018   ID:  [name], DOB 16-Jun-1945, MRN [id number]  PCP:  Seward Carol, MD  Cardiologist:  Dr. Johnsie Cancel     No chief complaint on file.     History of Present Illness: [name] is a 73 y.o. female who presents for follow up of DOE and angina  She was placed back on lasix 40 mg daily and was seen June 5  CAD with pevious rotoblator to the LAD in 2000. Myovue on 11/2006 showed mild apical thinning and was low risk.   May 2019 admitted with sepsis syndrome pneumonia She had alveolar hemorrhage and brief PEA Arrest post bronchoscopy and was intubated for days and d/c to inpatient rehab   Breast cancer and sees Dr Burr Medico was on Arimidex but hospitalized in July with SBO pneumonia and pancreatic cyst. Noted to have renal vein thrombosis and placed on xarelto  For 6 months   TTE 09/04/18 EF 60-65% Strain -19.6 No significant valve dx   She has some pain in chest with activity and after eating Not severe and only lasts minutes Nitro gives her headache and "she hasn't needed it"  She is anemic with Hb most recently 9.8  Her Cr is climbing 4.4 on 7/29 Lasix d/c Had been increased due to edema and weight gain   Imdur was added for her CHF lasix 40 mg daily  Hydralazine increased June for BP  Had bone marrow aspiration on 09/09/18 on aranesp, IV feraheme and oral ferrous sulfate.  B 12 shot every month.  BM which showed hypercellular marrow, no evidence of MDS or other concerns  BP with oncology 144/74   She is frustrated by her many issues. Does not think she would want dialysis  Needs to f/u with renal to see if she can add back low dose diuretic for edema   Past Medical History:  Diagnosis Date  . Abdominal discomfort    Chronic N/V/D. Presumptive dx Crohn's dx per elevated p ANCA. Failed Entocort and Pentasa. Sep 2003 - ileocolectomy c anastomosis per Dr Deon Pilling 2/2 adhesions - path was hegative for Crohns. EGD, Sm  bowel follow through (11/03), and an eteroclysis (10/03) were unrevealing. Cuases hypomag and hypocalcemia.  . Adnexal mass 8/03   s/p lap BSO (R ovarian fibroma) & lysis of adhesions  . Allergy    Seasonal  . Anemia    Multifactorial. Baseline HgB 10-11 ish. B12 def - 150 in 3/10. Fe Def - ferritin 35 3/10. Both are being repleted.  . Breast cancer ([place]) 03/16/13   right, 5 o'clock  . CAD (coronary artery disease) 1996   1996 - PTCA and angioplasty diagonal branch. 2000 - Rotoblator & angiopllasty of diagonal. 2006 - subendocardial AMI, DES to proximal LAD.Marland Kitchen Also had 90% stenosis in distal apical LAD. EF 55 with apical hypokinesis. Indefinite ASA and Plavix.  . CHF (congestive heart failure) ([place])   . Chronic kidney disease    Chronic renal insuff baseline Cr 1.2 - 1.4 ish.  . Chronic pain    CT 10/10 = Spinal stenosis L2 - S1.  . Diabetes mellitus    Insulin dependent  . Hx of radiation therapy 06/02/13- 07/16/13   right rbeast 4500 cGy 25 sessions, right breast boost 1600 cGy in 8 sessions  . Hyperlipidemia    Managed with both a statin and Welchol. Welchol stopped 2014 2/2 cost and started on fenofibrate   . Hypertension    2006  B renal arteries patent. 2003 MRA - no RAS. 2003 pheo W/U Dr Hassell Done reportedly negative.  . Hypoxia 07/23/2017  . Lupus ([place])   . Personal history of radiation therapy   . RBBB   . SBO (small bowel obstruction) ([place]) 09/17/2017  . Stroke [facility])    Incidental finding MRI 2002 L lacunar infarct  . Wears dentures    top    Past Surgical History:  Procedure Laterality Date  . ABDOMINAL HYSTERECTOMY    . BILATERAL SALPINGOOPHORECTOMY  8/03   Lap BSO (R ovarian fibroma) and adhesion lysis  . BOWEL RESECTION  2003   ileocolectomy with anastomosis 2/2 adhesions  . BREAST BIOPSY    . BREAST LUMPECTOMY Right   . BREAST LUMPECTOMY WITH NEEDLE LOCALIZATION AND AXILLARY SENTINEL LYMPH NODE BX Right 04/22/2013   Procedure: BREAST LUMPECTOMY WITH NEEDLE LOCALIZATION  AND AXILLARY SENTINEL LYMPH NODE BX;  Surgeon: Stark Klein, MD;  Location: [facility];  Service: General;  Laterality: Right;  . CARDIAC CATHETERIZATION     2 stents  . CHOLECYSTECTOMY    . COLONOSCOPY    . HEMICOLECTOMY     R sided hemicolectomy  . HERNIA REPAIR     Ventral hernia repair  . PTCA  4/06     Current Outpatient Medications  Medication Sig Dispense Refill  . acetaminophen (TYLENOL) 325 MG tablet Take 650 mg by mouth every 6 (six) hours as needed for mild pain.    Marland Kitchen amLODipine (NORVASC) 10 MG tablet Take 1 tablet (10 mg total) by mouth daily. 30 tablet 0  . atorvastatin (LIPITOR) 40 MG tablet Take 1 tablet (40 mg total) by mouth at bedtime. 90 tablet 3  . carvedilol (COREG) 25 MG tablet Take 25 mg by mouth as directed.    . desloratadine (CLARINEX) 5 MG tablet Take 5 mg by mouth at bedtime as needed (allergies). Reported on 04/11/2015    . ergocalciferol (VITAMIN D2) 1.25 MG (50000 UT) capsule Take 1 capsule (50,000 Units total) by mouth once a week. X 8 wks & then go to OTC dose of 1000 units daily. 8 capsule 0  . ferrous fumarate (HEMOCYTE - 106 MG FE) 325 (106 Fe) MG TABS tablet Take 1 tablet by mouth as directed.    . gabapentin (NEURONTIN) 300 MG capsule Take 1 capsule (300 mg total) by mouth at bedtime. 30 capsule 1  . hydrALAZINE (APRESOLINE) 10 MG tablet Take 1 tablet (10 mg total) by mouth 2 (two) times a day. 180 tablet 3  . hydrOXYzine (ATARAX/VISTARIL) 25 MG tablet Take 25 mg by mouth 2 (two) times daily as needed.    . isosorbide mononitrate (IMDUR) 30 MG 24 hr tablet Take 0.5 tablets (15 mg total) by mouth daily. 45 tablet 1  . linagliptin (TRADJENTA) 5 MG TABS tablet Take 5 mg by mouth daily.    . nitroGLYCERIN (NITROSTAT) 0.4 MG SL tablet Place 1 tablet (0.4 mg total) under the tongue every 5 (five) minutes as needed for chest pain. 25 tablet 3  . potassium chloride SA (K-DUR,KLOR-CON) 20 MEQ tablet Take 1 tablet (20 mEq total) by mouth daily as  needed (when taking lasix). 30 tablet 1  . ULORIC 40 MG tablet Take 1 tablet by mouth daily.     No current facility-administered medications for this visit.     Allergies:   Percocet [oxycodone-acetaminophen]; Diazepam; Haloperidol lactate; Lorazepam; Morphine sulfate; Propoxyphene hcl; Tramadol hcl; Anesthetics, halogenated; Etomidate; Fentanyl; and Versed [midazolam]    Social  History:  The patient  reports that she has never smoked. She has never used smokeless tobacco. She reports that she does not drink alcohol or use drugs.   Family History:  The patient's family history includes Breast cancer in her maternal aunt and maternal grandmother; Breast cancer (age of onset: 11) in her mother; Clotting disorder in her maternal grandmother; Colon cancer in her maternal grandmother; Diabetes in her maternal grandmother; Heart attack in her maternal grandmother; Hypertension in her daughter; Kidney failure in her maternal aunt; Lung cancer in her maternal aunt; Pancreatic cancer (age of onset: 44) in her brother; Prostate cancer in her maternal uncle.    ROS:  General:no colds or fevers,  Weight down 2 lbs. From last visit.  Skin:no rashes or ulcers HEENT:no blurred vision, no congestion CV:see HPI PUL:see HPI GI:no diarrhea constipation or melena, no indigestion GU:no hematuria, no dysuria MS:no joint pain, no claudication Neuro:no syncope, no lightheadedness Endo:+ diabetes, no thyroid disease  Wt Readings from Last 3 Encounters:  11/18/18 189 lb (85.7 kg)  10/09/18 192 lb (87.1 kg)  10/07/18 194 lb (88 kg)     PHYSICAL EXAM: VS:  BP (!) 150/60   Pulse 75   Ht 5' 5"  (1.651 m)   Wt 189 lb (85.7 kg)   SpO2 99%   BMI 31.45 kg/m  , BMI Body mass index is 31.45 kg/m.  Affect appropriate Chronically ill overweight black female  HEENT: normal Neck supple with no adenopathy JVP normal no bruits no thyromegaly Lungs clear with no wheezing and good diaphragmatic motion Heart:   S1/S2 no murmur, no rub, gallop or click PMI normal Abdomen: benighn, BS positve, no tenderness, no AAA no bruit.  No HSM or HJR Distal pulses intact with no bruits No edema Neuro non-focal Skin warm and dry No muscular weakness    EKG:   SR rate 79 RBBB 08/22/18   Recent Labs: 11/14/2018: ALT 14; BUN 49; Creatinine, Ser 2.67; Hemoglobin 8.9; Platelets 278; Potassium 5.0; Sodium 141    Lipid Panel    Component Value Date/Time   CHOL 183 07/21/2015 1106   TRIG 107 07/24/2017 1444   HDL 47 07/21/2015 1106   CHOLHDL 3.9 07/21/2015 1106   CHOLHDL 3.8 09/10/2013 1041   VLDL 56 (H) 09/10/2013 1041   LDLCALC 88 07/21/2015 1106   LDLDIRECT 55 07/01/2012 1016       Other studies Reviewed: Additional studies/ records that were reviewed today include: . Echo 09/04/18   IMPRESSIONS   1. The left ventricle has normal systolic function with an ejection fraction of 60-65%. The cavity size was normal. There is moderate asymmetric left ventricular hypertrophy. Left ventricular diastolic Doppler parameters are consistent with impaired  relaxation. Indeterminate filling pressures The E/e' is 8-15. No evidence of left ventricular regional wall motion abnormalities. 2. The right ventricle has normal systolic function. The cavity was normal. There is no increase in right ventricular wall thickness. 3. The mitral valve is abnormal. Mild thickening of the mitral valve leaflet. 4. The tricuspid valve is grossly normal. 5. The aortic valve is grossly normal. No stenosis of the aortic valve. 6. The average left ventricular global longitudinal strain is -19.6 %.  SUMMARY  LVEF 60-65%, moderate assymetric septal hypertrophy, grade 1 DD, indeterminate LV filling pressure, normal GLS at -20%, trivial MR, normal LA size, trivial TR, normal RVSP, normal IVC FINDINGS Left Ventricle: The left ventricle has normal systolic function, with an ejection fraction of 60-65%. The cavity size was  normal. There is moderate asymmetric left ventricular hypertrophy. Left ventricular diastolic Doppler parameters are consistent  with impaired relaxation. Indeterminate filling pressures The E/e' is 8-15. No evidence of left ventricular regional wall motion abnormalities.. The average left ventricular global longitudinal strain is -19.6 %.   Nuc study 02/27/18 Addendum by Sueanne Margarita, MD on Sun Mar 02, 2018 7:58 PM   The left ventricular ejection fraction is moderately decreased (30-44%).  Nuclear stress EF: 37%.  There was no ST segment deviation noted during stress.  Study is of very poor quality due to evere extracardiac tracer uptake  Recommend repeat stress images or consider coronary CTA   Finalized by Sueanne Margarita, MD on Sun Mar 02, 2018 7:57 PM   The left ventricular ejection fraction is moderately decreased (30-44%).  Nuclear stress EF: 37%.  There was no ST segment deviation noted during stress.     ASSESSMENT AND PLAN:  1. Angina resolved on imdur defer cath due to CRF low risk myovue 02/27/18  2. DOE: EF normal by echo 09/04/18  Lungs clear today   3.  Diastolic HF chronic stable.  Her anemia plays a role but with IRON and arenesp this may improve.  4.  Anemia followed by oncology may be due to chronic renal disease.  5. CRF:  F/u Hollie Salk consider adding back low dose lasix qod for edema  Cr now over 4     Current medicines are reviewed with the patient today.  The patient Has no concerns regarding medicines.  The following changes have been made:  See above Labs/ tests ordered today include:see above  Disposition:   FU:  6 months   Signed, [name], MD  11/18/2018 3:31 PM    [facility] [place], [place], [address] [place], [place] Phone: [phone]; Fax: [phone]

## 2018-11-07 ENCOUNTER — Ambulatory Visit: Payer: Medicare Other

## 2018-11-07 ENCOUNTER — Other Ambulatory Visit: Payer: Medicare Other

## 2018-11-12 ENCOUNTER — Ambulatory Visit: Payer: Medicare Other | Admitting: Registered"

## 2018-11-13 ENCOUNTER — Encounter: Payer: Medicare Other | Admitting: Physical Medicine & Rehabilitation

## 2018-11-14 ENCOUNTER — Other Ambulatory Visit: Payer: Self-pay

## 2018-11-14 ENCOUNTER — Inpatient Hospital Stay: Payer: Medicare Other | Attending: Internal Medicine

## 2018-11-14 ENCOUNTER — Inpatient Hospital Stay: Payer: Medicare Other

## 2018-11-14 ENCOUNTER — Other Ambulatory Visit: Payer: Self-pay | Admitting: Hematology

## 2018-11-14 VITALS — BP 138/66 | Temp 98.2°F | Resp 18

## 2018-11-14 DIAGNOSIS — D631 Anemia in chronic kidney disease: Secondary | ICD-10-CM | POA: Insufficient documentation

## 2018-11-14 DIAGNOSIS — N184 Chronic kidney disease, stage 4 (severe): Secondary | ICD-10-CM | POA: Diagnosis not present

## 2018-11-14 DIAGNOSIS — Z853 Personal history of malignant neoplasm of breast: Secondary | ICD-10-CM

## 2018-11-14 DIAGNOSIS — D509 Iron deficiency anemia, unspecified: Secondary | ICD-10-CM

## 2018-11-14 LAB — COMPREHENSIVE METABOLIC PANEL
ALT: 14 U/L (ref 0–44)
AST: 19 U/L (ref 15–41)
Albumin: 3.8 g/dL (ref 3.5–5.0)
Alkaline Phosphatase: 54 U/L (ref 38–126)
Anion gap: 9 (ref 5–15)
BUN: 49 mg/dL — ABNORMAL HIGH (ref 8–23)
CO2: 22 mmol/L (ref 22–32)
Calcium: 9.5 mg/dL (ref 8.9–10.3)
Chloride: 110 mmol/L (ref 98–111)
Creatinine, Ser: 2.67 mg/dL — ABNORMAL HIGH (ref 0.44–1.00)
GFR calc Af Amer: 20 mL/min — ABNORMAL LOW (ref >60)
GFR calc non Af Amer: 17 mL/min — ABNORMAL LOW (ref >60)
Glucose, Bld: 110 mg/dL — ABNORMAL HIGH (ref 70–99)
Potassium: 5 mmol/L (ref 3.5–5.1)
Sodium: 141 mmol/L (ref 135–145)
Total Bilirubin: 0.2 mg/dL — ABNORMAL LOW (ref 0.3–1.2)
Total Protein: 8 g/dL (ref 6.5–8.1)

## 2018-11-14 LAB — CBC WITH DIFFERENTIAL/PLATELET
Abs Immature Granulocytes: 0.04 10*3/uL (ref 0.00–0.07)
Basophils Absolute: 0 10*3/uL (ref 0.0–0.1)
Basophils Relative: 0 %
Eosinophils Absolute: 0.1 10*3/uL (ref 0.0–0.5)
Eosinophils Relative: 1 %
HCT: 28.2 % — ABNORMAL LOW (ref 36.0–46.0)
Hemoglobin: 8.9 g/dL — ABNORMAL LOW (ref 12.0–15.0)
Immature Granulocytes: 1 %
Lymphocytes Relative: 18 %
Lymphs Abs: 1.4 10*3/uL (ref 0.7–4.0)
MCH: 26.9 pg (ref 26.0–34.0)
MCHC: 31.6 g/dL (ref 30.0–36.0)
MCV: 85.2 fL (ref 80.0–100.0)
Monocytes Absolute: 0.4 10*3/uL (ref 0.1–1.0)
Monocytes Relative: 5 %
Neutro Abs: 5.7 10*3/uL (ref 1.7–7.7)
Neutrophils Relative %: 75 %
Platelets: 278 10*3/uL (ref 150–400)
RBC: 3.31 MIL/uL — ABNORMAL LOW (ref 3.87–5.11)
RDW: 14.9 % (ref 11.5–15.5)
WBC: 7.7 10*3/uL (ref 4.0–10.5)
nRBC: 0 % (ref 0.0–0.2)

## 2018-11-14 MED ORDER — DARBEPOETIN ALFA 100 MCG/0.5ML IJ SOSY: 100.0000 ug | PREFILLED_SYRINGE | Freq: Once | INTRAMUSCULAR | Status: DC

## 2018-11-14 MED ORDER — EPOETIN ALFA-EPBX 10000 UNIT/ML IJ SOLN
10000.0000 [IU] | Freq: Once | INTRAMUSCULAR | Status: AC
  Administered 2018-11-14: 10000 [IU] via SUBCUTANEOUS
  Filled 2018-11-14: qty 1

## 2018-11-14 NOTE — Progress Notes (Signed)
Aranesp not preferred by patient's insurance. Orders changed to retacrit 10,000 units Q2 weeks per MD. Appointments will be updated.   Demetrius Charity, PharmD, [facility] Phone: [phone] 11/14/2018

## 2018-11-14 NOTE — Progress Notes (Signed)
Pt received retacrit 10,000 units INJ

## 2018-11-14 NOTE — Patient Instructions (Signed)

## 2018-11-18 ENCOUNTER — Telehealth: Payer: Self-pay | Admitting: Hematology

## 2018-11-18 ENCOUNTER — Encounter: Payer: Self-pay | Admitting: Cardiovascular Disease

## 2018-11-18 ENCOUNTER — Ambulatory Visit (INDEPENDENT_AMBULATORY_CARE_PROVIDER_SITE_OTHER): Payer: Medicare Other | Admitting: Cardiovascular Disease

## 2018-11-18 ENCOUNTER — Other Ambulatory Visit: Payer: Self-pay

## 2018-11-18 VITALS — BP 150/60 | HR 75 | Ht 65.0 in | Wt 189.0 lb

## 2018-11-18 DIAGNOSIS — I2510 Atherosclerotic heart disease of native coronary artery without angina pectoris: Secondary | ICD-10-CM

## 2018-11-18 NOTE — Patient Instructions (Addendum)
Your physician recommends that you continue on your current medications as directed. Please refer to the Current Medication list given to you today.   Your physician wants you to follow-up in:  [place] will receive a reminder letter in the mail two months in advance. If you don't receive a letter, please call our office to schedule the follow-up appointment.

## 2018-11-18 NOTE — Telephone Encounter (Signed)
Scheduled appt per 8/28 sch message - unable to reach pt . Left message with appt date and time

## 2018-11-21 ENCOUNTER — Other Ambulatory Visit: Payer: Self-pay

## 2018-11-21 ENCOUNTER — Encounter: Payer: Medicare Other | Attending: Physical Medicine & Rehabilitation | Admitting: Physical Medicine & Rehabilitation

## 2018-11-21 ENCOUNTER — Encounter: Payer: Self-pay | Admitting: Physical Medicine & Rehabilitation

## 2018-11-21 VITALS — BP 155/76 | HR 78 | Temp 98.7°F | Resp 16 | Ht 65.0 in | Wt 189.4 lb

## 2018-11-21 DIAGNOSIS — M5416 Radiculopathy, lumbar region: Secondary | ICD-10-CM

## 2018-11-21 DIAGNOSIS — M5417 Radiculopathy, lumbosacral region: Secondary | ICD-10-CM | POA: Insufficient documentation

## 2018-11-21 DIAGNOSIS — M48062 Spinal stenosis, lumbar region with neurogenic claudication: Secondary | ICD-10-CM

## 2018-11-21 MED ORDER — GABAPENTIN 300 MG PO CAPS: 300.0000 mg | ORAL_CAPSULE | Freq: Every day | ORAL | 1 refills | Status: DC

## 2018-11-21 NOTE — Progress Notes (Signed)
Lumbar transforaminal epidural steroid injection under fluoroscopic guidance with contrast enhancement  Indication: Lumbosacral radiculitis is not relieved by medication management or other conservative care and interfering with self-care and mobility.   Informed consent was obtained after describing risk and benefits of the procedure with the patient, this includes bleeding, bruising, infection, paralysis and medication side effects.  The patient wishes to proceed and has given written consent.  Patient was placed in prone position.  The lumbar area was marked and prepped with Betadine.  It was entered with a 25-gauge 1-1/2 inch needle and one mL of 1% lidocaine was injected into the skin and subcutaneous tissue.  Then a 22-gauge 3.5 in spinal needle was inserted into the RIght L5-S1 intervertebral foramen under AP, lateral, and oblique view.  Once needle tip was within the foramen on lateral views an dnor exceeding 6 o clock position on th epedical on AP viewed Isovue 200 was inected x 30m Then a solution containing one mL of 10 mg per mL dexamethasone and 2 mL of 1% lidocaine was injected.  The patient tolerated procedure well.  Post procedure instructions were given.  Please see post procedure form.

## 2018-11-21 NOTE — Patient Instructions (Signed)
You received an epidural steroid injection under fluoroscopic guidance. This is the most accurate way to perform an epidural injection. This injection was performed to relieve thigh or leg or foot pain that may be related to a pinched nerve in the lumbar spine. The local anesthetic injected today may cause numbness in your leg for a couple hours. If it is severe we may need to observe you for 30-60 minutes after the injection. The cortisone medicine injected today may take several days to take full effect. This medicine can also cause facial flushing or feeling of being warm.  This injection may last for days weeks or months. It can be repeated if needed. If it is not effective, another spinal level may need to be injected. Other treatments include medication management as well as physical therapy. In some cases surgery may be an option.  

## 2018-11-21 NOTE — Progress Notes (Signed)
  PROCEDURE RECORD [facility]   Name: [name] DOB:09-21-1945 MRN: [id number]  Date:11/21/2018  Physician: Alysia Penna, MD    Nurse/CMA: [name] CMA / Connye Burkitt CMA  Allergies:  Allergies  Allergen Reactions  . Percocet [Oxycodone-Acetaminophen] Other (See Comments)    headaches  . Diazepam Other (See Comments)    REACTION: Agitation  . Haloperidol Lactate Nausea And Vomiting  . Lorazepam Nausea Only  . Morphine Sulfate Other (See Comments)    REACTION: Agitation  . Propoxyphene Hcl Nausea And Vomiting  . Tramadol Hcl Nausea And Vomiting  . Anesthetics, Halogenated     Per patient's daughter, she can not tolerate anesthetics.   . Etomidate     Heart stopped during anesthesia  . Fentanyl     Heart stopped during anesthesia   . Versed [Midazolam]     Heart stopped during anesthesia    Consent Signed: Yes.    Is patient diabetic? Yes.    CBG today? 133  Pregnant: No. LMP: No LMP recorded. Patient has had a hysterectomy. (age 42-55)  Anticoagulants: no Anti-inflammatory: no Antibiotics: no  Procedure: Right L5 Transformanial ESI Position: Prone   Start Time:4:05pm End Time:4:10pm  Fluoro Time: 24  RN/CMA Mabry CMA [name], CMA    Time 3:26 4:18pm    BP 155/76 152/68    Pulse 78 76    Respirations 16 16    O2 Sat 95 91    S/S 6    6    Pain Level 5/10 0/10     D/C home with [name] , patient A & O X 3, D/C instructions reviewed, and sits independently.

## 2018-11-28 ENCOUNTER — Other Ambulatory Visit: Payer: Self-pay

## 2018-11-28 ENCOUNTER — Inpatient Hospital Stay: Payer: Medicare Other

## 2018-11-28 ENCOUNTER — Inpatient Hospital Stay: Payer: Medicare Other | Attending: Internal Medicine

## 2018-11-28 VITALS — BP 148/72

## 2018-11-28 DIAGNOSIS — Z23 Encounter for immunization: Secondary | ICD-10-CM | POA: Insufficient documentation

## 2018-11-28 DIAGNOSIS — D509 Iron deficiency anemia, unspecified: Secondary | ICD-10-CM

## 2018-11-28 DIAGNOSIS — Z853 Personal history of malignant neoplasm of breast: Secondary | ICD-10-CM

## 2018-11-28 DIAGNOSIS — N184 Chronic kidney disease, stage 4 (severe): Secondary | ICD-10-CM | POA: Insufficient documentation

## 2018-11-28 DIAGNOSIS — D631 Anemia in chronic kidney disease: Secondary | ICD-10-CM | POA: Diagnosis present

## 2018-11-28 LAB — CBC WITH DIFFERENTIAL/PLATELET
Abs Immature Granulocytes: 0.06 10*3/uL (ref 0.00–0.07)
Basophils Absolute: 0 10*3/uL (ref 0.0–0.1)
Basophils Relative: 0 %
Eosinophils Absolute: 0.1 10*3/uL (ref 0.0–0.5)
Eosinophils Relative: 1 %
HCT: 29.7 % — ABNORMAL LOW (ref 36.0–46.0)
Hemoglobin: 9.4 g/dL — ABNORMAL LOW (ref 12.0–15.0)
Immature Granulocytes: 1 %
Lymphocytes Relative: 22 %
Lymphs Abs: 1.9 10*3/uL (ref 0.7–4.0)
MCH: 27.1 pg (ref 26.0–34.0)
MCHC: 31.6 g/dL (ref 30.0–36.0)
MCV: 85.6 fL (ref 80.0–100.0)
Monocytes Absolute: 0.6 10*3/uL (ref 0.1–1.0)
Monocytes Relative: 6 %
Neutro Abs: 6.1 10*3/uL (ref 1.7–7.7)
Neutrophils Relative %: 70 %
Platelets: 282 10*3/uL (ref 150–400)
RBC: 3.47 MIL/uL — ABNORMAL LOW (ref 3.87–5.11)
RDW: 15.2 % (ref 11.5–15.5)
WBC: 8.8 10*3/uL (ref 4.0–10.5)
nRBC: 0 % (ref 0.0–0.2)

## 2018-11-28 LAB — COMPREHENSIVE METABOLIC PANEL
ALT: 16 U/L (ref 0–44)
AST: 16 U/L (ref 15–41)
Albumin: 4 g/dL (ref 3.5–5.0)
Alkaline Phosphatase: 57 U/L (ref 38–126)
Anion gap: 8 (ref 5–15)
BUN: 53 mg/dL — ABNORMAL HIGH (ref 8–23)
CO2: 21 mmol/L — ABNORMAL LOW (ref 22–32)
Calcium: 9.3 mg/dL (ref 8.9–10.3)
Chloride: 109 mmol/L (ref 98–111)
Creatinine, Ser: 2.54 mg/dL — ABNORMAL HIGH (ref 0.44–1.00)
GFR calc Af Amer: 21 mL/min — ABNORMAL LOW (ref >60)
GFR calc non Af Amer: 18 mL/min — ABNORMAL LOW (ref >60)
Glucose, Bld: 88 mg/dL (ref 70–99)
Potassium: 5.1 mmol/L (ref 3.5–5.1)
Sodium: 138 mmol/L (ref 135–145)
Total Bilirubin: 0.2 mg/dL — ABNORMAL LOW (ref 0.3–1.2)
Total Protein: 7.9 g/dL (ref 6.5–8.1)

## 2018-11-28 MED ORDER — EPOETIN ALFA-EPBX 10000 UNIT/ML IJ SOLN
10000.0000 [IU] | Freq: Once | INTRAMUSCULAR | Status: AC
  Administered 2018-11-28: 15:00:00 10000 [IU] via SUBCUTANEOUS
  Filled 2018-11-28: qty 1

## 2018-11-28 NOTE — Patient Instructions (Signed)

## 2018-12-04 IMAGING — CT CT ABD-PELV W/ CM
2 of 5 series · 13 of 46 positions shown, 15 images · IV contrast (APPLIED)
Comparison: CT of the abdomen pelvis dated 07/19/2016

CLINICAL DATA: 72-year-old female with abdominal pain.

EXAM:
CT ABDOMEN AND PELVIS WITH CONTRAST
TECHNIQUE: Multidetector CT imaging of the abdomen and pelvis was performed
using the standard protocol following bolus administration of
intravenous contrast.
CONTRAST:  75mL OMNIPAQUE IOHEXOL 300 MG/ML  SOLN

[Series 3: abdomen 5.0 · axial · 0.67mm/px · z∈[+828,+1213]mm · 10 of 91 slices shown, 12 images]
[im 7/91  soft-tissue]
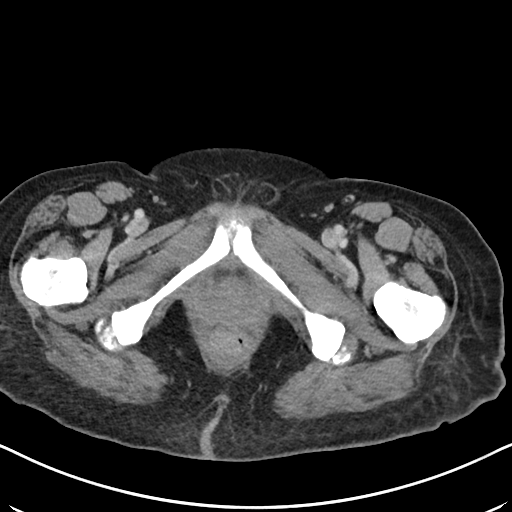
[im 7/91  bone]
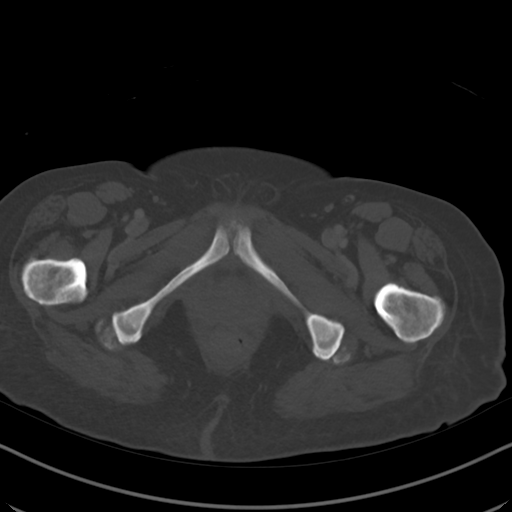
[im 13/91  soft-tissue]
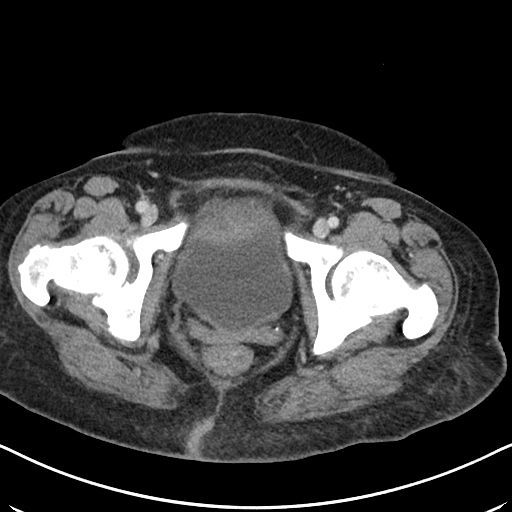
[im 26/91  soft-tissue]
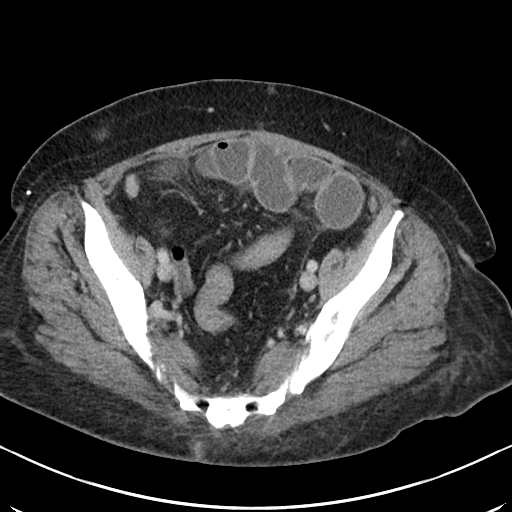
[im 33/91  soft-tissue]
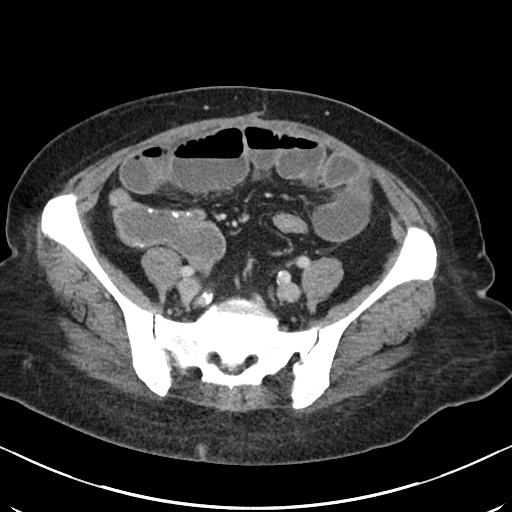
[im 39/91  soft-tissue]
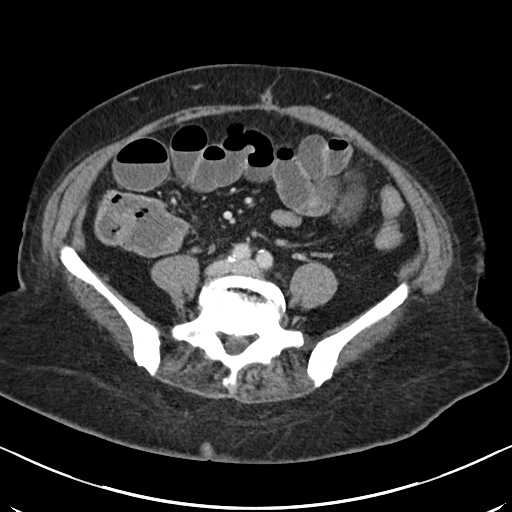
[im 52/91  soft-tissue]
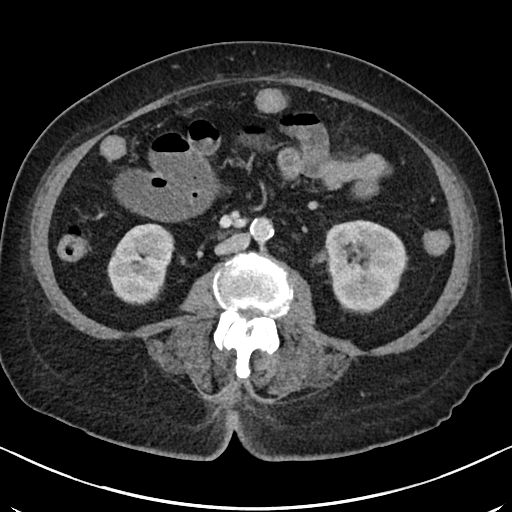
[im 58/91  soft-tissue]
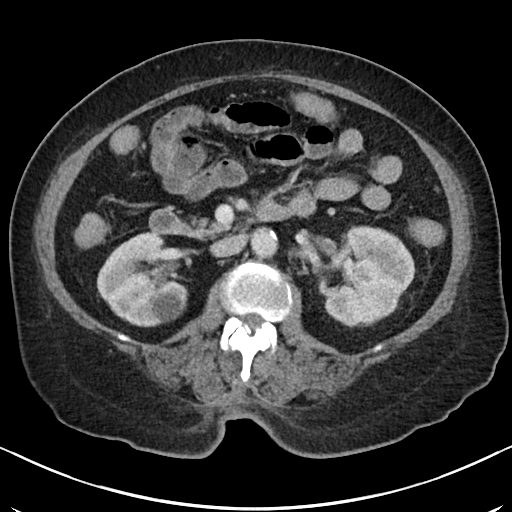
[im 65/91  soft-tissue]
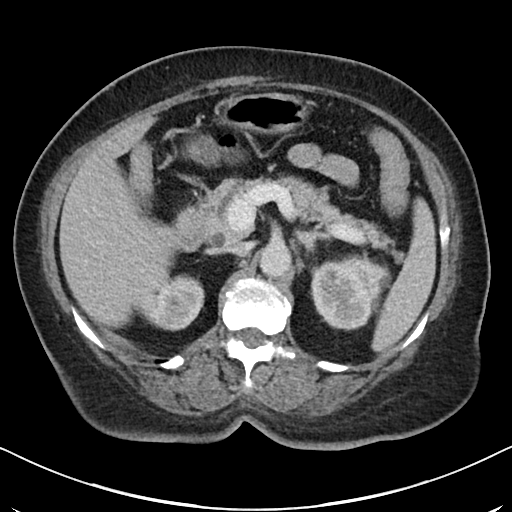
[im 78/91  soft-tissue]
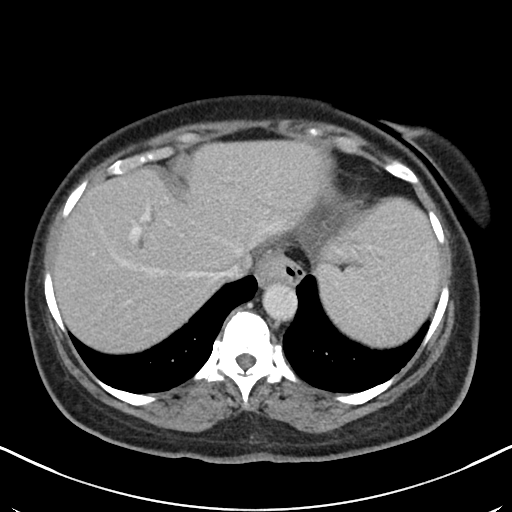
[im 78/91  bone]
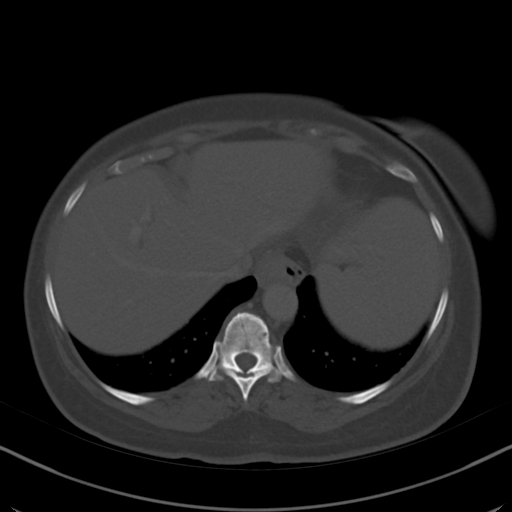
[im 84/91  soft-tissue]
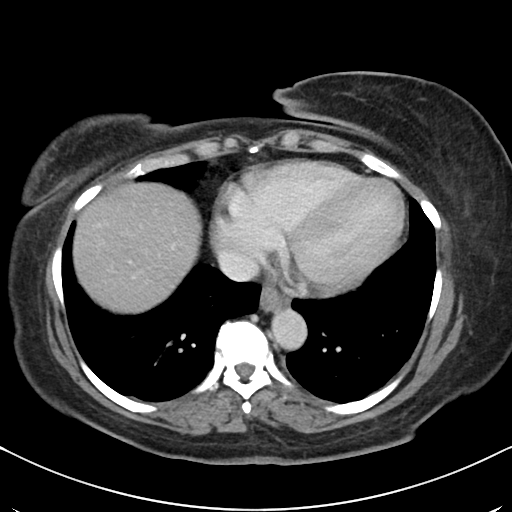

[Series 6: abdomen 3.0 mpr cor · coronal · 0.67mm/px · 3 of 98 slices shown]
[im 33/98  soft-tissue]
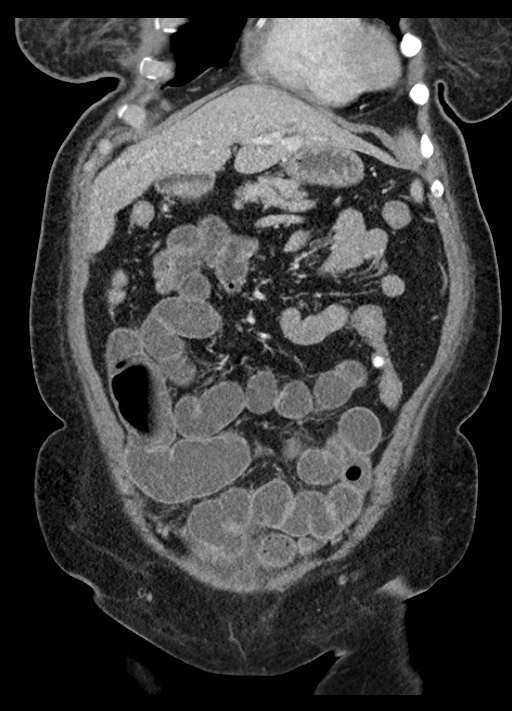
[im 44/98  soft-tissue]
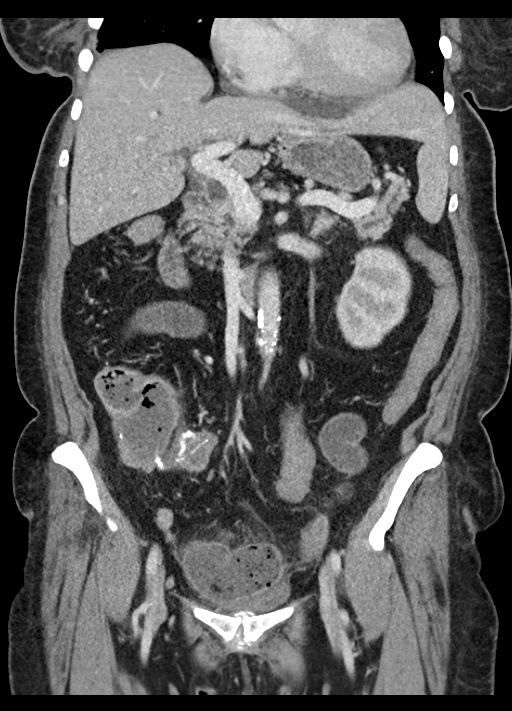
[im 54/98  soft-tissue]
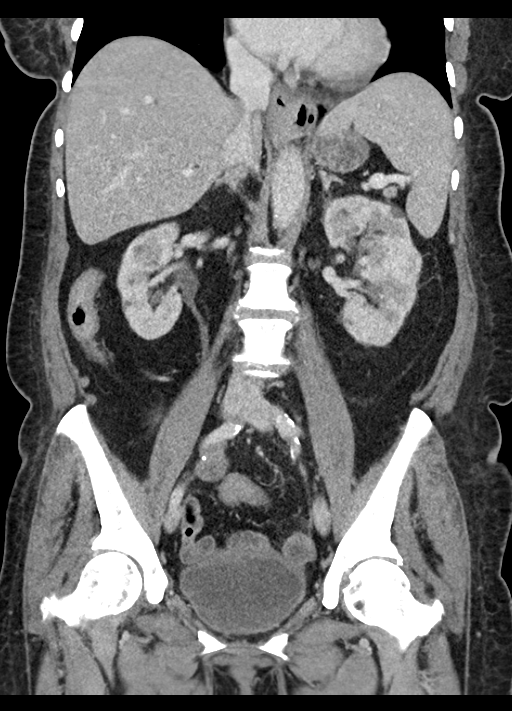

[13 of 46 positions shown; findings below may reference images not displayed]

FINDINGS: Lower chest: The visualized lung bases are clear. There is mild
cardiomegaly. Coronary vascular calcification noted. Small
pericardial effusion.

No intra-abdominal free air or free fluid.

Hepatobiliary: Cholecystectomy. There is mild intrahepatic biliary
ductal dilatation, likely post cholecystectomy. The liver is
unremarkable. No retained calcified stone noted in the central CBD.

Pancreas: Mildly dilated pancreatic duct measuring approximately 4
mm diameter. This finding is similar to prior CT and may be related
to sequela of chronic pancreatitis. There is a 9 x 11 mm apparent
soft tissue density in the region of the ampulla (series 3 image 32
and coronal series 6, image 51). This may be artifactual and related
to volume averaging artifact with adjacent soft tissues. An
ampullary lesion is not excluded. Further evaluation with MRCP is
recommended. No active inflammatory changes.

Spleen: Normal in size without focal abnormality.

Adrenals/Urinary Tract: The adrenal glands are unremarkable. There
is a 1.5 cm right renal interpolar cyst. Additional subcentimeter
renal hypodense lesions are too small to characterize. There is no
hydronephrosis on either side. The visualized ureters and urinary
bladder appear unremarkable.

Stomach/Bowel: There is a small hiatal hernia. There is postsurgical
changes of partial bowel resection with ileocecal anastomosis in the
right lower quadrant. There is dilatation of multiple fluid-filled
loops of small bowel in the lower abdomen measuring up to 3.8 cm in
diameter. There is a transition zone in the pelvis superior to the
bladder (series 3 image 71 and coronal series 6, image 56), likely
secondary to adhesions. There is abutment of multiple loops of small
bowel to the anterior peritoneal wall compatible with adhesions.
There is mild inflammatory changes of these loops of bowel. The
appendix is surgically absent.

Vascular/Lymphatic: There is a large left renal vein thrombus
extending to the junction of the IVC. No definite clot identified
within the IVC. The etiology of the clot is not excluded but may be
related to underlying systemic/metabolic disease. However, a tumor
thrombus is not entirely excluded. No definite suspicious mass noted
in the left kidney by CT. However, further evaluation with MRI or CT
urography on a nonemergent basis is recommended. There is advanced
aortoiliac atherosclerotic disease. The IVC appears unremarkable. No
portal venous gas. There is no adenopathy.

Reproductive: Hysterectomy.  No pelvic mass.

Other: Midline vertical anterior pelvic wall incisional scar. Mild
diffuse subcutaneous edema. Focal area of haziness in the omentum
(series 3, image 44) likely represents an area of omental contusion
or infarct. No fluid collection.

Musculoskeletal: Degenerative changes of the spine. No acute osseous
pathology. There is thickening of the right breast skin with
infiltration of the fat in the right breast tissue. A 1.9 x 1.5 cm
nodular density associated with multiple adjacent surgical clips
noted in the retroareolar region. Clinical correlation is
recommended. No fluid collection.
IMPRESSION: 1. Small-bowel obstruction with transition in the pelvis likely
secondary to adhesions.
2. Left renal vein thrombus of indeterminate etiology. Although this
may represent a bland thrombus related to underlying
systemic/metabolic condition, tumor thrombus is not entirely
excluded. Further evaluation with MRI or CT urography on a
nonemergent basis is recommended to exclude an occult renal
neoplasm. No definite extension of tumor into the IVC on this CT.
3. Mildly dilated main pancreatic duct similar to prior CT, likely
chronic and sequela of chronic pancreatitis. An ill-defined nodular
soft tissue density in the region of the ampulla is likely
artifactual and related to volume-averaging artifact. An ampullary
lesion is not entirely excluded. Further evaluation with MRCP on a
nonemergent basis is recommended.
4.  Aortic Atherosclerosis ([id number]).

These results were called by telephone at the time of interpretation
on 09/17/2017 at [DATE] to Dr. FACI ARAAR , who verbally
acknowledged these results.

## 2018-12-08 ENCOUNTER — Ambulatory Visit: Payer: Medicare Other | Admitting: Hematology

## 2018-12-08 ENCOUNTER — Ambulatory Visit: Payer: Medicare Other

## 2018-12-08 ENCOUNTER — Other Ambulatory Visit: Payer: Medicare Other

## 2018-12-11 NOTE — Progress Notes (Signed)
[place] [place]   [phone] [phone]   Clinic Follow up Note   Patient Care Team: Seward Carol, MD as PCP - General (Internal Medicine) Josue Hector, MD as PCP - Cardiology (Cardiology) Thelma Comp, Gr[place] as Consulting Physician (Optometry) Stark Klein, MD as Consulting Physician (General Surgery) Truitt Merle, MD as Consulting Physician (Hematology) Madelon Lips, MD as Consulting Physician (Nephrology)  Date of Service:  12/15/2018  CHIEF COMPLAINT: Followup of rightbreast cancer and anemia of chronic disease  SUMMARY OF ONCOLOGIC HISTORY: Oncology History Overview Note  Cancer Staging Breast cancer of lower-inner quadrant of right female breast [name]) Staging form: Breast, AJCC 7th Edition - Clinical: Stage IA (T1b, N0, cM0) - Unsigned Staging comments: Staged at breast conference 03/25/13.  - Pathologic stage from 05/12/2013: Stage IA (T1b, N0, cM0) - Signed by Truitt Merle, MD on 06/21/2016     Breast cancer of lower-inner quadrant of right female breast ([place])  02/27/2013 Mammogram   Diagnostic mammogram and ultrasound showed a 6 x 5 mm mass at 5:00 position. Axilla was negative by ultrasound.   03/16/2013 Initial Biopsy   Right breast 5:00 position biopsy showed invasive ductal carcinoma, low-grade   03/16/2013 Receptors her2   ER100% positive, PR 100% positive, HER-2 negative, Ki-67 20%   03/18/2013 Initial Diagnosis   Breast cancer of lower-inner quadrant of right female breast ([place])   05/12/2013 Surgery   Right breast lumpectomy with sentinel lymph node biopsy   05/12/2013 Pathology Results   Right breast lumpectomy showed invasive ductal carcinoma, grade 2, 0.9 cm, 6 sentinel lymph nodes were all negative. Margins were negative.   05/12/2013 Oncotype testing   Oncotype recurrence score 19, intermediate risk, 10 year risk of distant recurrence 12% with tamoxifen for 5 years. Patient declined adjuvant chemotherapy.    06/02/2013 - 07/16/2013 Radiation Therapy   Adjuvant breast radiation with boost 1600Gy   08/06/2013 - 07/2017 Anti-estrogen oral therapy   Arimidex 1 mg daily. Stopped in 07/2017 due to acute illness    03/22/2017 Mammogram   IMPRESSION: No mammographic evidence for malignancy.      CURRENT THERAPY:  -Retacrit injection starting 09/2018, currently q2weeks  -IV Feraheme as needed starting 09/15/18  -Oral Ferrous sulfate daily   INTERVAL HISTORY:  [name] is here for a follow up of right breast cancer and anemia. She presents to the clinic alone. She notes she is doing well. She denied any new changes recently. She feels her energy is fair. She feels she is able to complete her normal activities but slower. She is tolerating Retacrit injection. She is also on oral iron. She notes she has been checking her breasts. She lives alone and takes care of herself and her home adequately. She notes occasional LE swelling as the day goes.    REVIEW OF SYSTEMS:   Constitutional: Denies fevers, chills or abnormal weight loss (+) Fair energy  Eyes: Denies blurriness of vision Ears, nose, mouth, throat, and face: Denies mucositis or sore throat Respiratory: Denies cough, dyspnea or wheezes Cardiovascular: Denies palpitation, chest discomfort (+) Occasional lower extremity swelling Gastrointestinal:  Denies nausea, heartburn or change in bowel habits Skin: Denies abnormal skin rashes Lymphatics: Denies new lymphadenopathy or easy bruising Neurological:Denies numbness, tingling or new weaknesses Behavioral/Psych: Mood is stable, no new changes  All other systems were reviewed with the patient and are negative.  MEDICAL HISTORY:  Past Medical History:  Diagnosis Date  . Abdominal discomfort    Chronic N/V/D. Presumptive dx  Crohn's dx per elevated p ANCA. Failed Entocort and Pentasa. Sep 2003 - ileocolectomy c anastomosis per Dr Deon Pilling 2/2 adhesions - path was hegative for Crohns. EGD, Sm  bowel follow through (11/03), and an eteroclysis (10/03) were unrevealing. Cuases hypomag and hypocalcemia.  . Adnexal mass 8/03   s/p lap BSO (R ovarian fibroma) & lysis of adhesions  . Allergy    Seasonal  . Anemia    Multifactorial. Baseline HgB 10-11 ish. B12 def - 150 in 3/10. Fe Def - ferritin 35 3/10. Both are being repleted.  . Breast cancer ([place]) 03/16/13   right, 5 o'clock  . CAD (coronary artery disease) 1996   1996 - PTCA and angioplasty diagonal branch. 2000 - Rotoblator & angiopllasty of diagonal. 2006 - subendocardial AMI, DES to proximal LAD.Marland Kitchen Also had 90% stenosis in distal apical LAD. EF 55 with apical hypokinesis. Indefinite ASA and Plavix.  . CHF (congestive heart failure) ([place])   . Chronic kidney disease    Chronic renal insuff baseline Cr 1.2 - 1.4 ish.  . Chronic pain    CT 10/10 = Spinal stenosis L2 - S1.  . Diabetes mellitus    Insulin dependent  . Hx of radiation therapy 06/02/13- 07/16/13   right rbeast 4500 cGy 25 sessions, right breast boost 1600 cGy in 8 sessions  . Hyperlipidemia    Managed with both a statin and Welchol. Welchol stopped 2014 2/2 cost and started on fenofibrate   . Hypertension    2006 B renal arteries patent. 2003 MRA - no RAS. 2003 pheo W/U Dr Hassell Done reportedly negative.  . Hypoxia 07/23/2017  . Lupus ([place])   . Personal history of radiation therapy   . RBBB   . SBO (small bowel obstruction) ([place]) 09/17/2017  . Stroke [facility])    Incidental finding MRI 2002 L lacunar infarct  . Wears dentures    top    SURGICAL HISTORY: Past Surgical History:  Procedure Laterality Date  . ABDOMINAL HYSTERECTOMY    . BILATERAL SALPINGOOPHORECTOMY  8/03   Lap BSO (R ovarian fibroma) and adhesion lysis  . BOWEL RESECTION  2003   ileocolectomy with anastomosis 2/2 adhesions  . BREAST BIOPSY    . BREAST LUMPECTOMY Right   . BREAST LUMPECTOMY WITH NEEDLE LOCALIZATION AND AXILLARY SENTINEL LYMPH NODE BX Right 04/22/2013   Procedure: BREAST LUMPECTOMY WITH  NEEDLE LOCALIZATION AND AXILLARY SENTINEL LYMPH NODE BX;  Surgeon: Stark Klein, MD;  Location: [facility];  Service: General;  Laterality: Right;  . CARDIAC CATHETERIZATION     2 stents  . CHOLECYSTECTOMY    . COLONOSCOPY    . HEMICOLECTOMY     R sided hemicolectomy  . HERNIA REPAIR     Ventral hernia repair  . PTCA  4/06    I have reviewed the social history and family history with the patient and they are unchanged from previous note.  ALLERGIES:  is allergic to percocet [oxycodone-acetaminophen]; diazepam; haloperidol lactate; lorazepam; morphine sulfate; propoxyphene hcl; tramadol hcl; anesthetics, halogenated; etomidate; fentanyl; and versed [midazolam].  MEDICATIONS:  Current Outpatient Medications  Medication Sig Dispense Refill  . acetaminophen (TYLENOL) 325 MG tablet Take 650 mg by mouth every 6 (six) hours as needed for mild pain.    Marland Kitchen amLODipine (NORVASC) 10 MG tablet Take 1 tablet (10 mg total) by mouth daily. 30 tablet 0  . carvedilol (COREG) 25 MG tablet Take 25 mg by mouth as directed.    . desloratadine (CLARINEX) 5 MG tablet Take  5 mg by mouth at bedtime as needed (allergies). Reported on 04/11/2015    . ferrous fumarate (HEMOCYTE - 106 MG FE) 325 (106 Fe) MG TABS tablet Take 1 tablet by mouth as directed.    . gabapentin (NEURONTIN) 300 MG capsule Take 1 capsule (300 mg total) by mouth at bedtime. 30 capsule 1  . hydrALAZINE (APRESOLINE) 10 MG tablet Take 1 tablet (10 mg total) by mouth 2 (two) times a day. 180 tablet 3  . hydrOXYzine (ATARAX/VISTARIL) 25 MG tablet Take 25 mg by mouth 2 (two) times daily as needed.    . linagliptin (TRADJENTA) 5 MG TABS tablet Take 5 mg by mouth daily.    . nitroGLYCERIN (NITROSTAT) 0.4 MG SL tablet Place 1 tablet (0.4 mg total) under the tongue every 5 (five) minutes as needed for chest pain. 25 tablet 3  . potassium chloride SA (K-DUR,KLOR-CON) 20 MEQ tablet Take 1 tablet (20 mEq total) by mouth daily as needed (when  taking lasix). 30 tablet 1  . ULORIC 40 MG tablet Take 1 tablet by mouth daily.    . isosorbide mononitrate (IMDUR) 30 MG 24 hr tablet Take 0.5 tablets (15 mg total) by mouth daily. 45 tablet 1   No current facility-administered medications for this visit.     PHYSICAL EXAMINATION: ECOG PERFORMANCE STATUS: 1 - Symptomatic but completely ambulatory  Vitals:   12/15/18 0945  BP: (!) 162/64  Pulse: 77  Resp: 18  Temp: 98.9 F (37.2 C)  SpO2: 99%   Filed Weights   12/15/18 0945  Weight: 185 lb 8 oz (84.1 kg)    GENERAL:alert, no distress and comfortable SKIN: skin color, texture, turgor are normal, no rashes or significant lesions EYES: normal, Conjunctiva are pink and non-injected, sclera clear  NECK: supple, thyroid normal size, non-tender, without nodularity LYMPH:  no palpable lymphadenopathy in the cervical, axillary  LUNGS: clear to auscultation and percussion with normal breathing effort HEART: regular rate & rhythm and no murmurs and no lower extremity edema ABDOMEN:abdomen soft, non-tender and normal bowel sounds Musculoskeletal:no cyanosis of digits and no clubbing  NEURO: alert & oriented x 3 with fluent speech, no focal motor/sensory deficits BREAST: S/p right lumpectomy: Surgical incision healed well. No palpable mass, nodules or adenopathy bilaterally. Breast exam benign.   LABORATORY DATA:  I have reviewed the data as listed CBC Latest Ref Rng & Units 12/15/2018 11/28/2018 11/14/2018  WBC 4.0 - 10.5 K/uL 6.4 8.8 7.7  Hemoglobin 12.0 - 15.0 g/dL 10.1(L) 9.4(L) 8.9(L)  Hematocrit 36.0 - 46.0 % 31.4(L) 29.7(L) 28.2(L)  Platelets 150 - 400 K/uL 282 282 278     CMP Latest Ref Rng & Units 12/15/2018 11/28/2018 11/14/2018  Glucose 70 - 99 mg/dL 160(H) 88 110(H)  BUN 8 - 23 mg/dL 41(H) 53(H) 49(H)  Creatinine 0.44 - 1.00 mg/dL 1.85(H) 2.54(H) 2.67(H)  Sodium 135 - 145 mmol/L 139 138 141  Potassium 3.5 - 5.1 mmol/L 4.9 5.1 5.0  Chloride 98 - 111 mmol/L 110 109 110   CO2 22 - 32 mmol/L 21(L) 21(L) 22  Calcium 8.9 - 10.3 mg/dL 9.3 9.3 9.5  Total Protein 6.5 - 8.1 g/dL 7.7 7.9 8.0  Total Bilirubin 0.3 - 1.2 mg/dL <0.2(L) 0.2(L) 0.2(L)  Alkaline Phos 38 - 126 U/L 93 57 54  AST 15 - 41 U/L 26 16 19   ALT 0 - 44 U/L 36 16 14      RADIOGRAPHIC STUDIES: I have personally reviewed the radiological images as listed and agreed  with the findings in the report. No results found.   ASSESSMENT & PLAN:  [name] is a 73 y.o. female with   1. Breast cancer of the lower inner quadrant of the right breast, invasive ductal carcinoma with DCIS, pT1bN0Mo, stage IA, ER+ and PR +, HER 2 negative -She was diagnosed in 02/2013 and treated with right lumpectomy, adjuvant radiation andAnastrozolefor 4 years until5/2019.Anastrozole was stopped after she developed septic shock from pneumonia and a cardiac arrest,and we decided not restarted due to the risk of cardiac toxicity. -From a breast cancer standpoint she is clinically doing well. Lab reviewed, CBC and CMP WNL except Hg 10.1, which is improved. Her physical exam and her 1/2060mmmogram were unremarkable. There is no clinical concern for recurrence. -She isalmost 6years out since diagnosis. Continue breast cancer surveillance, Next mammogram in 03/2019 -F/u in 12 weeks   -She opted to proceed with flu shot today   2. Anemia of chronic disease (CKD), Ironand B12deficiency -Patient receives a B12 shot every month at PCP's office -Her 04/2018 Colonoscopy with Dr GCarlean Purlshowed no evidence of GI bleedingor source of bleeding.  -I discussed heranemia is probably related to her CKD, however her anemia is out of proportion of her stage IV CKD related anemia. -I discussed her 09/09/18 bone marrow biopsy results, which showed hypercellular marrow, no evidence of MDS or other concerns. Her Cytogenetics is still pending.  -Her ferritin level is on low side 46, given her CKD, I recommend one dose iv  feraheme on 09/15/18. -Her insurance did not approve Aranesp so she started Retacrit every 2 weeks on 11/14/18. She is tolerating well. Goal is Hg 10-11.  -Continue OTC iron pill ferrous sulfate 1-2 tab dailyfor irondeficiency -Hg at 10.1 today (12/15/18), anemia improved. Iron panel still pending. Will proceed with Retacrit today (12/15/18) and continue every 2 weeks.    3. Low Backpain, from degenerative arthritis and spinal stenosis - f/u with Orthopedics -She is currently getting injections with orthopedist   4. DM  -managedby PCP -Currently on Lantis, tradjentra -Latest Glucose at198  5. Chronic renal insufficieny, CKD stageIV -She follows up with Dr.Uptonfor herkidney function.  -She knows to avoid nephrotoxins -Her BP and latest glucose has been evaluated. I strongly encouraged her to control this as this will negatively impact her kidney function.   6. CAD, HTN -Continue to follow up withDr. NJohnsie Cancel-BP at162/64 today (12/15/18), stable.  7. Genetics results negative  8. Bone Health  -DEXA from 04/2018 shows slightly improved normal bone density with lowest T-score of 0.9 at forearm radius -Her VitD level is low. She will continue 1000u daily.    9. Left Renal Vein Thrombosisin 07/2017 -Likely provoked from acute illnessand prior SBO -She stopped Xarelto in 01/2018 -Her hypercoagulopathy work-up in November 2019 was negative  10. History of pneumoniaand cardiac arrestin 07/2017 -10/16/17 CXR showed residual peribronchial disease,atelectasis, scarring or potentially pulmonary nodules. -Her latest CT chest from 01/27/18 showsNo acute findings. Scarring in the left upper lobe.   PLAN -Flu shot today  -Labs reviewed, will proceed with Retacrit today -Lab and Retacrit every 2 weeks X6 -Continue oral ferrous sulfate daily  -Mammogram in 03/2019 -f/u in 12 weeks  -will check with her PCP if they want [place] give her B12 injections here     No problem-specific Assessment & Plan notes found for this encounter.   Orders Placed This Encounter  Procedures  . MM DIAG BREAST TOMO BILATERAL    Standing Status:  Future    Standing Expiration Date:   12/15/2019    Order Specific Question:   Reason for Exam (SYMPTOM  OR DIAGNOSIS REQUIRED)    Answer:   screening    Order Specific Question:   Preferred imaging location?    Answer:   [facility]   All questions were answered. The patient knows to call the clinic with any problems, questions or concerns. No barriers to learning was detected. I spent 20 minutes counseling the patient face to face. The total time spent in the appointment was 25 minutes and more than 50% was on counseling and review of test results     Truitt Merle, MD 12/15/2018   I, Joslyn Devon, am acting as scribe for Truitt Merle, MD.   I have reviewed the above documentation for accuracy and completeness, and I agree with the above.

## 2018-12-15 ENCOUNTER — Inpatient Hospital Stay: Payer: Medicare Other

## 2018-12-15 ENCOUNTER — Inpatient Hospital Stay (HOSPITAL_BASED_OUTPATIENT_CLINIC_OR_DEPARTMENT_OTHER): Payer: Medicare Other | Admitting: Hematology

## 2018-12-15 ENCOUNTER — Other Ambulatory Visit: Payer: Self-pay

## 2018-12-15 ENCOUNTER — Encounter: Payer: Self-pay | Admitting: Hematology

## 2018-12-15 VITALS — BP 162/64 | HR 77 | Temp 98.9°F | Resp 18 | Ht 65.0 in | Wt 185.5 lb

## 2018-12-15 DIAGNOSIS — D508 Other iron deficiency anemias: Secondary | ICD-10-CM

## 2018-12-15 DIAGNOSIS — Z23 Encounter for immunization: Secondary | ICD-10-CM

## 2018-12-15 DIAGNOSIS — N184 Chronic kidney disease, stage 4 (severe): Secondary | ICD-10-CM | POA: Diagnosis not present

## 2018-12-15 DIAGNOSIS — Z170 Estrogen receptor positive status [ER+]: Secondary | ICD-10-CM

## 2018-12-15 DIAGNOSIS — Z853 Personal history of malignant neoplasm of breast: Secondary | ICD-10-CM

## 2018-12-15 DIAGNOSIS — D631 Anemia in chronic kidney disease: Secondary | ICD-10-CM | POA: Diagnosis not present

## 2018-12-15 DIAGNOSIS — C50311 Malignant neoplasm of lower-inner quadrant of right female breast: Secondary | ICD-10-CM

## 2018-12-15 DIAGNOSIS — D509 Iron deficiency anemia, unspecified: Secondary | ICD-10-CM

## 2018-12-15 LAB — CBC WITH DIFFERENTIAL/PLATELET
Abs Immature Granulocytes: 0.02 10*3/uL (ref 0.00–0.07)
Basophils Absolute: 0 10*3/uL (ref 0.0–0.1)
Basophils Relative: 0 %
Eosinophils Absolute: 0.1 10*3/uL (ref 0.0–0.5)
Eosinophils Relative: 1 %
HCT: 31.4 % — ABNORMAL LOW (ref 36.0–46.0)
Hemoglobin: 10.1 g/dL — ABNORMAL LOW (ref 12.0–15.0)
Immature Granulocytes: 0 %
Lymphocytes Relative: 24 %
Lymphs Abs: 1.5 10*3/uL (ref 0.7–4.0)
MCH: 27.3 pg (ref 26.0–34.0)
MCHC: 32.2 g/dL (ref 30.0–36.0)
MCV: 84.9 fL (ref 80.0–100.0)
Monocytes Absolute: 0.4 10*3/uL (ref 0.1–1.0)
Monocytes Relative: 6 %
Neutro Abs: 4.4 10*3/uL (ref 1.7–7.7)
Neutrophils Relative %: 69 %
Platelets: 282 10*3/uL (ref 150–400)
RBC: 3.7 MIL/uL — ABNORMAL LOW (ref 3.87–5.11)
RDW: 14.9 % (ref 11.5–15.5)
WBC: 6.4 10*3/uL (ref 4.0–10.5)
nRBC: 0 % (ref 0.0–0.2)

## 2018-12-15 LAB — COMPREHENSIVE METABOLIC PANEL
ALT: 36 U/L (ref 0–44)
AST: 26 U/L (ref 15–41)
Albumin: 3.6 g/dL (ref 3.5–5.0)
Alkaline Phosphatase: 93 U/L (ref 38–126)
Anion gap: 8 (ref 5–15)
BUN: 41 mg/dL — ABNORMAL HIGH (ref 8–23)
CO2: 21 mmol/L — ABNORMAL LOW (ref 22–32)
Calcium: 9.3 mg/dL (ref 8.9–10.3)
Chloride: 110 mmol/L (ref 98–111)
Creatinine, Ser: 1.85 mg/dL — ABNORMAL HIGH (ref 0.44–1.00)
GFR calc Af Amer: 31 mL/min — ABNORMAL LOW (ref >60)
GFR calc non Af Amer: 27 mL/min — ABNORMAL LOW (ref >60)
Glucose, Bld: 160 mg/dL — ABNORMAL HIGH (ref 70–99)
Potassium: 4.9 mmol/L (ref 3.5–5.1)
Sodium: 139 mmol/L (ref 135–145)
Total Bilirubin: <0.2 mg/dL — ABNORMAL LOW (ref 0.3–1.2)
Total Protein: 7.7 g/dL (ref 6.5–8.1)

## 2018-12-15 LAB — IRON AND TIBC
Iron: 62 ug/dL (ref 41–142)
Saturation Ratios: 24 % (ref 21–57)
TIBC: 260 ug/dL (ref 236–444)
UIBC: 198 ug/dL (ref 120–384)

## 2018-12-15 LAB — FERRITIN: Ferritin: 248 ng/mL (ref 11–307)

## 2018-12-15 MED ORDER — INFLUENZA VAC A&B SA ADJ QUAD 0.5 ML IM PRSY
PREFILLED_SYRINGE | INTRAMUSCULAR | Status: AC
  Filled 2018-12-15: qty 0.5

## 2018-12-15 MED ORDER — EPOETIN ALFA-EPBX 10000 UNIT/ML IJ SOLN
10000.0000 [IU] | Freq: Once | INTRAMUSCULAR | Status: AC
  Administered 2018-12-15: 10000 [IU] via SUBCUTANEOUS
  Filled 2018-12-15: qty 1

## 2018-12-15 MED ORDER — INFLUENZA VAC A&B SA ADJ QUAD 0.5 ML IM PRSY
0.5000 mL | PREFILLED_SYRINGE | INTRAMUSCULAR | Status: AC
  Administered 2018-12-15: 0.5 mL via INTRAMUSCULAR

## 2018-12-15 NOTE — Patient Instructions (Signed)

## 2018-12-16 ENCOUNTER — Telehealth: Payer: Self-pay | Admitting: Hematology

## 2018-12-16 NOTE — Telephone Encounter (Signed)
Scheduled appt per 9/28 los.  Spoke with pt and she is aware of her appt date and time,.

## 2018-12-19 ENCOUNTER — Encounter: Payer: Medicare Other | Attending: Physical Medicine & Rehabilitation | Admitting: Physical Medicine & Rehabilitation

## 2018-12-19 ENCOUNTER — Other Ambulatory Visit: Payer: Self-pay

## 2018-12-19 ENCOUNTER — Ambulatory Visit: Payer: Medicare Other | Admitting: Physical Medicine & Rehabilitation

## 2018-12-19 VITALS — BP 151/70 | HR 74 | Temp 97.3°F | Ht 65.0 in | Wt 181.0 lb

## 2018-12-19 DIAGNOSIS — M5416 Radiculopathy, lumbar region: Secondary | ICD-10-CM | POA: Diagnosis not present

## 2018-12-19 DIAGNOSIS — M5417 Radiculopathy, lumbosacral region: Secondary | ICD-10-CM | POA: Insufficient documentation

## 2018-12-19 NOTE — Patient Instructions (Signed)
You received an epidural steroid injection under fluoroscopic guidance. This is the most accurate way to perform an epidural injection. This injection was performed to relieve thigh or leg or foot pain that may be related to a pinched nerve in the lumbar spine. The local anesthetic injected today may cause numbness in your leg for a couple hours. If it is severe we may need to observe you for 30-60 minutes after the injection. The cortisone medicine injected today may take several days to take full effect. This medicine can also cause facial flushing or feeling of being warm.  This injection may last for days weeks or months. It can be repeated if needed. If it is not effective, another spinal level may need to be injected. Other treatments include medication management as well as physical therapy. In some cases surgery may be an option.  

## 2018-12-19 NOTE — Progress Notes (Signed)
  PROCEDURE RECORD [facility]   Name: [name] DOB:10-Apr-1945 MRN: [id number]  Date:12/19/2018  Physician: Alysia Penna, MD    Nurse/CMA: [name], CMA  Allergies:  Allergies  Allergen Reactions  . Percocet [Oxycodone-Acetaminophen] Other (See Comments)    headaches  . Diazepam Other (See Comments)    REACTION: Agitation  . Haloperidol Lactate Nausea And Vomiting  . Lorazepam Nausea Only  . Morphine Sulfate Other (See Comments)    REACTION: Agitation  . Propoxyphene Hcl Nausea And Vomiting  . Tramadol Hcl Nausea And Vomiting  . Anesthetics, Halogenated     Per patient's daughter, she can not tolerate anesthetics.   . Etomidate     Heart stopped during anesthesia  . Fentanyl     Heart stopped during anesthesia   . Versed [Midazolam]     Heart stopped during anesthesia    Consent Signed: Yes.    Is patient diabetic? Yes.    CBG today? 61  Pregnant: No. LMP: No LMP recorded. Patient has had a hysterectomy. (age [age])  Anticoagulants: no Anti-inflammatory: no Antibiotics: no  Procedure:Right L5 Epidural Steroid Injection Position: Prone Start Time: 3;30pm End Time: 3:35pm Fluoro Time: 25  RN/CMA [name], CMA Hayzel Ruberg, CMA    Time 3:28 3:42pm    BP 151/70 153/71    Pulse 74 74    Respirations 16 16    O2 Sat 98 92    S/S 6 6    Pain Level 5/10 2/10     D/C home with daughter, patient A & O X 3, D/C instructions reviewed, and sits independently.

## 2018-12-19 NOTE — Progress Notes (Signed)
RIght L5 Lumbar transforaminal epidural steroid injection under fluoroscopic guidance with contrast enhancement  Indication: Lumbosacral radiculitis is not relieved by medication management or other conservative care and interfering with self-care and mobility.   Informed consent was obtained after describing risk and benefits of the procedure with the patient, this includes bleeding, bruising, infection, paralysis and medication side effects.  The patient wishes to proceed and has given written consent.  Patient was placed in prone position.  The lumbar area was marked and prepped with Betadine.  It was entered with a 25-gauge 1-1/2 inch needle and one mL of 1% lidocaine was injected into the skin and subcutaneous tissue.  Then a 22-gauge 3.5in  spinal needle was inserted into the RIght L5-S1 intervertebral foramen under AP, lateral, and oblique view.  Once needle tip was within the foramen on lateral views an dnor exceeding 6 o clock position on th epedical on AP viewed Isovue 200 was inected x 35m Then a solution containing one mL of 10 mg per mL dexamethasone and 2 mL of 1% lidocaine was injected.  The patient tolerated procedure well. Post procedure numbness in Right toes, motor function intact.  Post procedure instructions were given.  Please see post procedure form.

## 2018-12-29 ENCOUNTER — Inpatient Hospital Stay: Payer: Medicare Other | Attending: Internal Medicine

## 2018-12-29 ENCOUNTER — Inpatient Hospital Stay: Payer: Medicare Other

## 2018-12-29 ENCOUNTER — Other Ambulatory Visit: Payer: Self-pay

## 2018-12-29 VITALS — BP 152/62 | HR 72 | Temp 98.2°F | Resp 18

## 2018-12-29 DIAGNOSIS — N184 Chronic kidney disease, stage 4 (severe): Secondary | ICD-10-CM | POA: Diagnosis not present

## 2018-12-29 DIAGNOSIS — D509 Iron deficiency anemia, unspecified: Secondary | ICD-10-CM

## 2018-12-29 DIAGNOSIS — Z853 Personal history of malignant neoplasm of breast: Secondary | ICD-10-CM

## 2018-12-29 DIAGNOSIS — D631 Anemia in chronic kidney disease: Secondary | ICD-10-CM | POA: Insufficient documentation

## 2018-12-29 LAB — CBC WITH DIFFERENTIAL/PLATELET
Abs Immature Granulocytes: 0.06 10*3/uL (ref 0.00–0.07)
Basophils Absolute: 0 10*3/uL (ref 0.0–0.1)
Basophils Relative: 0 %
Eosinophils Absolute: 0.1 10*3/uL (ref 0.0–0.5)
Eosinophils Relative: 1 %
HCT: 33 % — ABNORMAL LOW (ref 36.0–46.0)
Hemoglobin: 10.8 g/dL — ABNORMAL LOW (ref 12.0–15.0)
Immature Granulocytes: 1 %
Lymphocytes Relative: 21 %
Lymphs Abs: 1.9 10*3/uL (ref 0.7–4.0)
MCH: 27.5 pg (ref 26.0–34.0)
MCHC: 32.7 g/dL (ref 30.0–36.0)
MCV: 84 fL (ref 80.0–100.0)
Monocytes Absolute: 0.5 10*3/uL (ref 0.1–1.0)
Monocytes Relative: 5 %
Neutro Abs: 6.6 10*3/uL (ref 1.7–7.7)
Neutrophils Relative %: 72 %
Platelets: 292 10*3/uL (ref 150–400)
RBC: 3.93 MIL/uL (ref 3.87–5.11)
RDW: 15.2 % (ref 11.5–15.5)
WBC: 9.2 10*3/uL (ref 4.0–10.5)
nRBC: 0 % (ref 0.0–0.2)

## 2018-12-29 LAB — COMPREHENSIVE METABOLIC PANEL
ALT: 41 U/L (ref 0–44)
AST: 27 U/L (ref 15–41)
Albumin: 3.7 g/dL (ref 3.5–5.0)
Alkaline Phosphatase: 98 U/L (ref 38–126)
Anion gap: 8 (ref 5–15)
BUN: 38 mg/dL — ABNORMAL HIGH (ref 8–23)
CO2: 20 mmol/L — ABNORMAL LOW (ref 22–32)
Calcium: 9.8 mg/dL (ref 8.9–10.3)
Chloride: 112 mmol/L — ABNORMAL HIGH (ref 98–111)
Creatinine, Ser: 2.07 mg/dL — ABNORMAL HIGH (ref 0.44–1.00)
GFR calc Af Amer: 27 mL/min — ABNORMAL LOW (ref >60)
GFR calc non Af Amer: 24 mL/min — ABNORMAL LOW (ref >60)
Glucose, Bld: 132 mg/dL — ABNORMAL HIGH (ref 70–99)
Potassium: 5.5 mmol/L — ABNORMAL HIGH (ref 3.5–5.1)
Sodium: 140 mmol/L (ref 135–145)
Total Bilirubin: 0.3 mg/dL (ref 0.3–1.2)
Total Protein: 8 g/dL (ref 6.5–8.1)

## 2018-12-29 MED ORDER — EPOETIN ALFA-EPBX 10000 UNIT/ML IJ SOLN
10000.0000 [IU] | Freq: Once | INTRAMUSCULAR | Status: AC
  Administered 2018-12-29: 10000 [IU] via SUBCUTANEOUS
  Filled 2018-12-29: qty 1

## 2018-12-29 NOTE — Progress Notes (Signed)
Spoke with Dr. Burr Medico, Pt Hgb is 10.8 today and she still wants her to receive her retacrit injection at this time

## 2018-12-30 ENCOUNTER — Other Ambulatory Visit: Payer: Self-pay | Admitting: Hematology

## 2018-12-30 ENCOUNTER — Telehealth: Payer: Self-pay

## 2018-12-30 DIAGNOSIS — D509 Iron deficiency anemia, unspecified: Secondary | ICD-10-CM

## 2018-12-30 DIAGNOSIS — C50311 Malignant neoplasm of lower-inner quadrant of right female breast: Secondary | ICD-10-CM

## 2018-12-30 DIAGNOSIS — Z170 Estrogen receptor positive status [ER+]: Secondary | ICD-10-CM

## 2018-12-30 NOTE — Telephone Encounter (Signed)
Left voice message on patient identified voice mail box.  Per Dr. Burr Medico informed her lab results showed that her potassium is too high.  Instructed her to stop taking her potassium (KCL) and to avoid foods rich in potassium.  Also Dr. Burr Medico wants her to follow up with either her PCP or nephrologist regarding her kidney function.  Encouraged her to call back if she has questions or concerns.

## 2018-12-30 NOTE — Telephone Encounter (Signed)
-----   Message from Truitt Merle, MD sent at 12/30/2018 12:28 PM EDT ----- Please let pt know her lab results, K high, please let her stop KCL if she is still taking it, avoid high K diet, and f/u with her PCP or nephrologist. Thanks   Truitt Merle

## 2019-01-05 ENCOUNTER — Telehealth: Payer: Self-pay

## 2019-01-05 MED ORDER — CYCLOBENZAPRINE HCL 5 MG PO TABS: 5.0000 mg | ORAL_TABLET | Freq: Two times a day (BID) | ORAL | 0 refills | Status: DC | PRN

## 2019-01-05 MED ORDER — CYCLOBENZAPRINE HCL 5 MG PO TABS: 5.0000 mg | ORAL_TABLET | Freq: Every evening | ORAL | 0 refills | Status: DC | PRN

## 2019-01-05 NOTE — Addendum Note (Signed)
Addended by: Bayard Hugger on: 01/05/2019 02:49 PM   Modules accepted: Orders

## 2019-01-05 NOTE — Telephone Encounter (Addendum)
Discontinued Flexeril  Order for BID. Flexeril 5 mg at HS e-scribed. [facility] was called to remove the previous Flexeril order regarding the above. Placed a call to Ms. Ahner regarding the above.

## 2019-01-05 NOTE — Telephone Encounter (Signed)
Return [name] call, she reports she's having  Muscle spasms in her lower back in the past she was prescribed Flexeril and received relief. . Medication list was reviewed. Will prescribe Flexeril 5 mg BID as needed, she is aware of the above and verbalizes understanding.

## 2019-01-05 NOTE — Telephone Encounter (Signed)
Patient called stating she is having pain in her back on the right and is requesting Flexeril. Dr. Letta Pate patient.

## 2019-01-12 ENCOUNTER — Other Ambulatory Visit: Payer: Self-pay | Admitting: Nurse Practitioner

## 2019-01-12 ENCOUNTER — Other Ambulatory Visit: Payer: Self-pay

## 2019-01-12 ENCOUNTER — Inpatient Hospital Stay: Payer: Medicare Other

## 2019-01-12 VITALS — BP 139/68 | HR 70 | Temp 98.5°F | Resp 18

## 2019-01-12 DIAGNOSIS — D509 Iron deficiency anemia, unspecified: Secondary | ICD-10-CM

## 2019-01-12 DIAGNOSIS — N184 Chronic kidney disease, stage 4 (severe): Secondary | ICD-10-CM | POA: Diagnosis not present

## 2019-01-12 DIAGNOSIS — C50311 Malignant neoplasm of lower-inner quadrant of right female breast: Secondary | ICD-10-CM

## 2019-01-12 DIAGNOSIS — Z853 Personal history of malignant neoplasm of breast: Secondary | ICD-10-CM

## 2019-01-12 LAB — CMP (CANCER CENTER ONLY)
ALT: 31 U/L (ref 0–44)
AST: 16 U/L (ref 15–41)
Albumin: 3.6 g/dL (ref 3.5–5.0)
Alkaline Phosphatase: 110 U/L (ref 38–126)
Anion gap: 9 (ref 5–15)
BUN: 47 mg/dL — ABNORMAL HIGH (ref 8–23)
CO2: 21 mmol/L — ABNORMAL LOW (ref 22–32)
Calcium: 9.5 mg/dL (ref 8.9–10.3)
Chloride: 109 mmol/L (ref 98–111)
Creatinine: 1.74 mg/dL — ABNORMAL HIGH (ref 0.44–1.00)
GFR, Est AFR Am: 34 mL/min — ABNORMAL LOW (ref >60)
GFR, Estimated: 29 mL/min — ABNORMAL LOW (ref >60)
Glucose, Bld: 119 mg/dL — ABNORMAL HIGH (ref 70–99)
Potassium: 4.8 mmol/L (ref 3.5–5.1)
Sodium: 139 mmol/L (ref 135–145)
Total Bilirubin: 0.2 mg/dL — ABNORMAL LOW (ref 0.3–1.2)
Total Protein: 7.6 g/dL (ref 6.5–8.1)

## 2019-01-12 LAB — CBC WITH DIFFERENTIAL/PLATELET
Abs Immature Granulocytes: 0.02 10*3/uL (ref 0.00–0.07)
Basophils Absolute: 0 10*3/uL (ref 0.0–0.1)
Basophils Relative: 0 %
Eosinophils Absolute: 0.1 10*3/uL (ref 0.0–0.5)
Eosinophils Relative: 1 %
HCT: 31.6 % — ABNORMAL LOW (ref 36.0–46.0)
Hemoglobin: 10.2 g/dL — ABNORMAL LOW (ref 12.0–15.0)
Immature Granulocytes: 0 %
Lymphocytes Relative: 25 %
Lymphs Abs: 1.8 10*3/uL (ref 0.7–4.0)
MCH: 27.1 pg (ref 26.0–34.0)
MCHC: 32.3 g/dL (ref 30.0–36.0)
MCV: 83.8 fL (ref 80.0–100.0)
Monocytes Absolute: 0.4 10*3/uL (ref 0.1–1.0)
Monocytes Relative: 6 %
Neutro Abs: 4.7 10*3/uL (ref 1.7–7.7)
Neutrophils Relative %: 68 %
Platelets: 249 10*3/uL (ref 150–400)
RBC: 3.77 MIL/uL — ABNORMAL LOW (ref 3.87–5.11)
RDW: 15.5 % (ref 11.5–15.5)
WBC: 7 10*3/uL (ref 4.0–10.5)
nRBC: 0 % (ref 0.0–0.2)

## 2019-01-12 LAB — FERRITIN: Ferritin: 193 ng/mL (ref 11–307)

## 2019-01-12 LAB — IRON AND TIBC
Iron: 51 ug/dL (ref 41–142)
Saturation Ratios: 20 % — ABNORMAL LOW (ref 21–57)
TIBC: 250 ug/dL (ref 236–444)
UIBC: 199 ug/dL (ref 120–384)

## 2019-01-12 MED ORDER — EPOETIN ALFA-EPBX 10000 UNIT/ML IJ SOLN
10000.0000 [IU] | Freq: Once | INTRAMUSCULAR | Status: AC
  Administered 2019-01-12: 10000 [IU] via SUBCUTANEOUS
  Filled 2019-01-12: qty 1

## 2019-01-12 NOTE — Patient Instructions (Signed)

## 2019-01-16 ENCOUNTER — Encounter: Payer: Medicare Other | Admitting: Physical Medicine & Rehabilitation

## 2019-01-23 ENCOUNTER — Telehealth: Payer: Self-pay

## 2019-01-23 NOTE — Telephone Encounter (Signed)
Faxed signed orders back to [facility] [place], sent to HIM For scan to chart.

## 2019-01-26 ENCOUNTER — Other Ambulatory Visit: Payer: Self-pay

## 2019-01-26 ENCOUNTER — Inpatient Hospital Stay: Payer: Medicare Other

## 2019-01-26 ENCOUNTER — Inpatient Hospital Stay: Payer: Medicare Other | Attending: Internal Medicine

## 2019-01-26 DIAGNOSIS — Z853 Personal history of malignant neoplasm of breast: Secondary | ICD-10-CM

## 2019-01-26 DIAGNOSIS — D509 Iron deficiency anemia, unspecified: Secondary | ICD-10-CM

## 2019-01-26 DIAGNOSIS — N184 Chronic kidney disease, stage 4 (severe): Secondary | ICD-10-CM | POA: Insufficient documentation

## 2019-01-26 DIAGNOSIS — D631 Anemia in chronic kidney disease: Secondary | ICD-10-CM | POA: Insufficient documentation

## 2019-01-26 LAB — CBC WITH DIFFERENTIAL/PLATELET
Abs Immature Granulocytes: 0.03 10*3/uL (ref 0.00–0.07)
Basophils Absolute: 0 10*3/uL (ref 0.0–0.1)
Basophils Relative: 1 %
Eosinophils Absolute: 0.1 10*3/uL (ref 0.0–0.5)
Eosinophils Relative: 1 %
HCT: 31.6 % — ABNORMAL LOW (ref 36.0–46.0)
Hemoglobin: 10.1 g/dL — ABNORMAL LOW (ref 12.0–15.0)
Immature Granulocytes: 0 %
Lymphocytes Relative: 22 %
Lymphs Abs: 1.9 10*3/uL (ref 0.7–4.0)
MCH: 27.5 pg (ref 26.0–34.0)
MCHC: 32 g/dL (ref 30.0–36.0)
MCV: 86.1 fL (ref 80.0–100.0)
Monocytes Absolute: 0.5 10*3/uL (ref 0.1–1.0)
Monocytes Relative: 6 %
Neutro Abs: 6.1 10*3/uL (ref 1.7–7.7)
Neutrophils Relative %: 70 %
Platelets: 285 10*3/uL (ref 150–400)
RBC: 3.67 MIL/uL — ABNORMAL LOW (ref 3.87–5.11)
RDW: 15.9 % — ABNORMAL HIGH (ref 11.5–15.5)
WBC: 8.7 10*3/uL (ref 4.0–10.5)
nRBC: 0 % (ref 0.0–0.2)

## 2019-01-26 MED ORDER — EPOETIN ALFA-EPBX 10000 UNIT/ML IJ SOLN
10000.0000 [IU] | Freq: Once | INTRAMUSCULAR | Status: AC
  Administered 2019-01-26: 10000 [IU] via SUBCUTANEOUS

## 2019-01-26 MED ORDER — EPOETIN ALFA-EPBX 10000 UNIT/ML IJ SOLN
INTRAMUSCULAR | Status: AC
  Filled 2019-01-26: qty 1

## 2019-01-26 NOTE — Patient Instructions (Signed)

## 2019-01-26 NOTE — Progress Notes (Signed)
Per Dr. Burr Medico, patient is to receive retacrit if hemoglobin is less than 11. Today patient's hemoglobin was 10.1.

## 2019-01-31 ENCOUNTER — Other Ambulatory Visit: Payer: Self-pay | Admitting: Cardiology

## 2019-02-03 ENCOUNTER — Other Ambulatory Visit: Payer: Self-pay

## 2019-02-03 ENCOUNTER — Encounter: Payer: Medicare Other | Attending: Physical Medicine & Rehabilitation | Admitting: Physical Medicine & Rehabilitation

## 2019-02-03 ENCOUNTER — Encounter: Payer: Self-pay | Admitting: Physical Medicine & Rehabilitation

## 2019-02-03 VITALS — BP 160/77 | HR 74 | Temp 97.5°F | Ht 65.0 in | Wt 181.0 lb

## 2019-02-03 DIAGNOSIS — M5417 Radiculopathy, lumbosacral region: Secondary | ICD-10-CM | POA: Insufficient documentation

## 2019-02-03 DIAGNOSIS — M5416 Radiculopathy, lumbar region: Secondary | ICD-10-CM

## 2019-02-03 MED ORDER — METHYLPREDNISOLONE 4 MG PO TBPK: ORAL_TABLET | ORAL | 0 refills | Status: DC

## 2019-02-03 NOTE — Progress Notes (Signed)
Subjective:    Patient ID: [name], female    DOB: 07-Aug-1945, 73 y.o.   MRN: [id number]  HPI  72 year old female with history of chronic low back pain radiating aiding down the lower extremity primarily on the right side.  She has had pain going into the right foot.  She has undergone right L5 transforaminal epidural steroid injection on 12/19/2018 as well as on 11/21/2018. The patient has bilateral S1 transforaminal epidural steroid injections on 08/04/2018 and a right S1 transforaminal epidural steroid injection on 04/04/2018.  Right sided low back pain no longer going to the foot but is going to the Right back of knee    No relief with gabapentin 600 mg nightly and 300 mg during the day or flexeril , has not tried pregabalin Cannot take NSAIDs due to CKD Allergic to most opioids but tramadol is mostly intolerance, nausea  Pain Inventory Average Pain 10 Pain Right Now 10 My pain is burning and aching  In the last 24 hours, has pain interfered with the following? General activity 10 Relation with others 10 Enjoyment of life 10 What TIME of day is your pain at its worst? all Sleep (in general) Poor  Pain is worse with: walking, bending, sitting and standing Pain improves with: heat/ice and medication Relief from Meds: 2  Mobility walk without assistance ability to climb steps?  no do you drive?  yes  Function employed # of hrs/week 30  Neuro/Psych numbness tingling  Prior Studies Any changes since last visit?  no  Physicians involved in your care Any changes since last visit?  no   Family History  Problem Relation Age of Onset  . Pancreatic cancer Brother 69  . Breast cancer Mother 104  . Lung cancer Maternal Aunt   . Breast cancer Maternal Aunt   . Prostate cancer Maternal Uncle   . Heart attack Maternal Grandmother   . Breast cancer Maternal Grandmother   . Colon cancer Maternal Grandmother   . Clotting disorder Maternal Grandmother   . Diabetes  Maternal Grandmother   . Kidney failure Maternal Aunt   . Hypertension Daughter    Social History   Socioeconomic History  . Marital status: Legally Separated    Spouse name: Not on file  . Number of children: 5  . Years of education: Not on file  . Highest education level: Not on file  Occupational History  . 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 Financial resource strain: Not on file  . Food insecurity    Worry: Not on file    Inability: Not on file  . Transportation needs    Medical: Not on file    Non-medical: Not on file  Tobacco Use  . Smoking status: Never Smoker  . Smokeless tobacco: Never Used  Substance and Sexual Activity  . Alcohol use: No    Alcohol/week: 0.0 standard drinks  . Drug use: No  . Sexual activity: Not on file  Lifestyle  . Physical activity    Days per week: Not on file    Minutes per session: Not on file  . Stress: Not on file  Relationships  . Social Herbalist on phone: Not on file    Gets together: Not on file    Attends religious service: Not on file    Active member of club or organization: Not on file    Attends meetings of clubs or organizations: Not  on file    Relationship status: Not on file  Other Topics Concern  . Not on file  Social History Narrative   She is separated 2 sons 3 daughters   She has grandchildren and great-grandchildren   She is retired   Never smoker no tobacco now no drugs no alcohol no caffeine   Past Surgical History:  Procedure Laterality Date  . ABDOMINAL HYSTERECTOMY    . BILATERAL SALPINGOOPHORECTOMY  8/03   Lap BSO (R ovarian fibroma) and adhesion lysis  . BOWEL RESECTION  2003   ileocolectomy with anastomosis 2/2 adhesions  . BREAST BIOPSY    . BREAST LUMPECTOMY Right   . BREAST LUMPECTOMY WITH NEEDLE LOCALIZATION AND AXILLARY SENTINEL LYMPH NODE BX Right 04/22/2013   Procedure: BREAST LUMPECTOMY WITH NEEDLE LOCALIZATION AND AXILLARY SENTINEL LYMPH  NODE BX;  Surgeon: Stark Klein, MD;  Location: [facility];  Service: General;  Laterality: Right;  . CARDIAC CATHETERIZATION     2 stents  . CHOLECYSTECTOMY    . COLONOSCOPY    . HEMICOLECTOMY     R sided hemicolectomy  . HERNIA REPAIR     Ventral hernia repair  . PTCA  4/06   Past Medical History:  Diagnosis Date  . Abdominal discomfort    Chronic N/V/D. Presumptive dx Crohn's dx per elevated p ANCA. Failed Entocort and Pentasa. Sep 2003 - ileocolectomy c anastomosis per Dr Deon Pilling 2/2 adhesions - path was hegative for Crohns. EGD, Sm bowel follow through (11/03), and an eteroclysis (10/03) were unrevealing. Cuases hypomag and hypocalcemia.  . Adnexal mass 8/03   s/p lap BSO (R ovarian fibroma) & lysis of adhesions  . Allergy    Seasonal  . Anemia    Multifactorial. Baseline HgB 10-11 ish. B12 def - 150 in 3/10. Fe Def - ferritin 35 3/10. Both are being repleted.  . Breast cancer ([facility]) 03/16/13   right, 5 o'clock  . CAD (coronary artery disease) 1996   1996 - PTCA and angioplasty diagonal branch. 2000 - Rotoblator & angiopllasty of diagonal. 2006 - subendocardial AMI, DES to proximal LAD.Marland Kitchen Also had 90% stenosis in distal apical LAD. EF 55 with apical hypokinesis. Indefinite ASA and Plavix.  . CHF (congestive heart failure) ([facility])   . Chronic kidney disease    Chronic renal insuff baseline Cr 1.2 - 1.4 ish.  . Chronic pain    CT 10/10 = Spinal stenosis L2 - S1.  . Diabetes mellitus    Insulin dependent  . Hx of radiation therapy 06/02/13- 07/16/13   right rbeast 4500 cGy 25 sessions, right breast boost 1600 cGy in 8 sessions  . Hyperlipidemia    Managed with both a statin and Welchol. Welchol stopped 2014 2/2 cost and started on fenofibrate   . Hypertension    2006 B renal arteries patent. 2003 MRA - no RAS. 2003 pheo W/U Dr Hassell Done reportedly negative.  . Hypoxia 07/23/2017  . Lupus ([facility])   . Personal history of radiation therapy   . RBBB   . SBO (small bowel  obstruction) ([place]) 09/17/2017  . Stroke [facility])    Incidental finding MRI 2002 L lacunar infarct  . Wears dentures    top   BP (!) 160/77   Pulse 74   Temp (!) 97.5 F (36.4 C)   Ht 5' 5"  (1.651 m)   Wt 181 lb (82.1 kg)   SpO2 97%   BMI 30.12 kg/m   Opioid Risk Score:   Fall Risk Score:  `  1  Depression screen PHQ 2/9  Depression screen [facility] 2/9 07/23/2017 04/18/2017 12/20/2016 10/16/2016 10/11/2016 06/11/2016 05/24/2016  Decreased Interest 0 0 0 0 0 0 0  Down, Depressed, Hopeless 0 0 0 0 0 0 0  PHQ - 2 Score 0 0 0 0 0 0 0  Some recent data might be hidden     Review of Systems  Constitutional: Negative.   HENT: Negative.   Eyes: Negative.   Respiratory: Negative.   Cardiovascular: Negative.   Gastrointestinal: Positive for diarrhea.  Endocrine: Negative.   Genitourinary: Negative.   Musculoskeletal: Positive for arthralgias and myalgias.  Skin: Negative.   Allergic/Immunologic: Negative.   Neurological: Positive for numbness.  Hematological: Negative.   Psychiatric/Behavioral: Negative.   All other systems reviewed and are negative.      Objective:   Physical Exam Vitals signs and nursing note reviewed.  HENT:     Head: Normocephalic and atraumatic.     Mouth/Throat:     Mouth: Mucous membranes are dry.  Eyes:     Extraocular Movements: Extraocular movements intact.     Conjunctiva/sclera: Conjunctivae normal.     Pupils: Pupils are equal, round, and reactive to light.  Neurological:     Mental Status: She is alert and oriented to person, place, and time.  Psychiatric:        Mood and Affect: Mood normal.        Behavior: Behavior normal.    Patient has normal sensation in the lower extremities to pinprick.  Negative straight leg raising Motor strength is 5/5 bilateral hip flexor knee extensor ankle dorsiflexor.      Assessment & Plan:  #1.  Lumbar spinal stenosis with persistent radicular pain right lower extremity.  Her pain below the knee has improved  after both L5 as well as S1 transforaminal epidural steroid injections under fluoroscopic guidance.  The patient has not had any injections done at L3-4 or L4-5 recently.  She did have a L4-5 laminectomy. Would recommend L3-4 versus L4-5 transforaminal injection on the right side in January.  This should help with the lateral knee pain.  We will discuss her symptoms at that time  Patient is to try prednisone burst and taper Medrol dose pack. If this is not helpful may try her on Lyrica in place of gabapentin Failing this may trial low-dose tramadol i.e. 25 mg twice daily with food,  patient agrees with plan

## 2019-02-03 NOTE — Patient Instructions (Addendum)
Will start out with cortisone taper, cont gabapentin If this does not help would try lyrica and if lyrica is not helpful would try low dose tramadol with food  Please call if you ned to try another medicine

## 2019-02-09 ENCOUNTER — Inpatient Hospital Stay: Payer: Medicare Other

## 2019-02-09 ENCOUNTER — Other Ambulatory Visit: Payer: Self-pay

## 2019-02-09 VITALS — BP 183/71 | HR 72 | Temp 98.5°F | Resp 16

## 2019-02-09 DIAGNOSIS — D509 Iron deficiency anemia, unspecified: Secondary | ICD-10-CM

## 2019-02-09 DIAGNOSIS — N184 Chronic kidney disease, stage 4 (severe): Secondary | ICD-10-CM | POA: Diagnosis not present

## 2019-02-09 DIAGNOSIS — Z853 Personal history of malignant neoplasm of breast: Secondary | ICD-10-CM

## 2019-02-09 LAB — CBC WITH DIFFERENTIAL/PLATELET
Abs Immature Granulocytes: 0.25 10*3/uL — ABNORMAL HIGH (ref 0.00–0.07)
Basophils Absolute: 0 10*3/uL (ref 0.0–0.1)
Basophils Relative: 0 %
Eosinophils Absolute: 0 10*3/uL (ref 0.0–0.5)
Eosinophils Relative: 0 %
HCT: 32.2 % — ABNORMAL LOW (ref 36.0–46.0)
Hemoglobin: 10.3 g/dL — ABNORMAL LOW (ref 12.0–15.0)
Immature Granulocytes: 2 %
Lymphocytes Relative: 16 %
Lymphs Abs: 2.4 10*3/uL (ref 0.7–4.0)
MCH: 27.8 pg (ref 26.0–34.0)
MCHC: 32 g/dL (ref 30.0–36.0)
MCV: 86.8 fL (ref 80.0–100.0)
Monocytes Absolute: 0.8 10*3/uL (ref 0.1–1.0)
Monocytes Relative: 6 %
Neutro Abs: 10.9 10*3/uL — ABNORMAL HIGH (ref 1.7–7.7)
Neutrophils Relative %: 76 %
Platelets: 241 10*3/uL (ref 150–400)
RBC: 3.71 MIL/uL — ABNORMAL LOW (ref 3.87–5.11)
RDW: 15.7 % — ABNORMAL HIGH (ref 11.5–15.5)
WBC: 14.4 10*3/uL — ABNORMAL HIGH (ref 4.0–10.5)
nRBC: 0 % (ref 0.0–0.2)

## 2019-02-09 MED ORDER — EPOETIN ALFA-EPBX 10000 UNIT/ML IJ SOLN
10000.0000 [IU] | Freq: Once | INTRAMUSCULAR | Status: AC
  Administered 2019-02-09: 10000 [IU] via SUBCUTANEOUS

## 2019-02-09 MED ORDER — EPOETIN ALFA-EPBX 10000 UNIT/ML IJ SOLN
INTRAMUSCULAR | Status: AC
  Filled 2019-02-09: qty 1

## 2019-02-09 NOTE — Patient Instructions (Signed)
Epoetin Alfa injection What is this medicine? EPOETIN ALFA (e POE e tin AL fa) helps your body make more red blood cells. This medicine is used to treat anemia caused by chronic kidney disease, cancer chemotherapy, or HIV-therapy. It may also be used before surgery if you have anemia. This medicine may be used for other purposes; ask your health care provider or pharmacist if you have questions. COMMON BRAND NAME(S): Epogen, Procrit, Retacrit What should I tell my health care provider before I take this medicine? They need to know if you have any of these conditions:  cancer  heart disease  high blood pressure  history of blood clots  history of stroke  low levels of folate, iron, or vitamin B12 in the blood  seizures  an unusual or allergic reaction to erythropoietin, albumin, benzyl alcohol, hamster proteins, other medicines, foods, dyes, or preservatives  pregnant or trying to get pregnant  breast-feeding How should I use this medicine? This medicine is for injection into a vein or under the skin. It is usually given by a health care professional in a hospital or clinic setting. If you get this medicine at home, you will be taught how to prepare and give this medicine. Use exactly as directed. Take your medicine at regular intervals. Do not take your medicine more often than directed. It is important that you put your used needles and syringes in a special sharps container. Do not put them in a trash can. If you do not have a sharps container, call your pharmacist or healthcare provider to get one. A special MedGuide will be given to you by the pharmacist with each prescription and refill. Be sure to read this information carefully each time. Talk to your pediatrician regarding the use of this medicine in children. While this drug may be prescribed for selected conditions, precautions do apply. Overdosage: If you think you have taken too much of this medicine contact a poison  control center or emergency room at once. NOTE: This medicine is only for you. Do not share this medicine with others. What if I miss a dose? If you miss a dose, take it as soon as you can. If it is almost time for your next dose, take only that dose. Do not take double or extra doses. What may interact with this medicine? Interactions have not been studied. This list may not describe all possible interactions. Give your health care provider a list of all the medicines, herbs, non-prescription drugs, or dietary supplements you use. Also tell them if you smoke, drink alcohol, or use illegal drugs. Some items may interact with your medicine. What should I watch for while using this medicine? Your condition will be monitored carefully while you are receiving this medicine. You may need blood work done while you are taking this medicine. This medicine may cause a decrease in vitamin B6. You should make sure that you get enough vitamin B6 while you are taking this medicine. Discuss the foods you eat and the vitamins you take with your health care professional. What side effects may I notice from receiving this medicine? Side effects that you should report to your doctor or health care professional as soon as possible:  allergic reactions like skin rash, itching or hives, swelling of the face, lips, or tongue  seizures  signs and symptoms of a blood clot such as breathing problems; changes in vision; chest pain; severe, sudden headache; pain, swelling, warmth in the leg; trouble speaking; sudden numbness or  weakness of the face, arm or leg  signs and symptoms of a stroke like changes in vision; confusion; trouble speaking or understanding; severe headaches; sudden numbness or weakness of the face, arm or leg; trouble walking; dizziness; loss of balance or coordination Side effects that usually do not require medical attention (report to your doctor or health care professional if they continue or are  bothersome):  chills  cough  dizziness  fever  headaches  joint pain  muscle cramps  muscle pain  nausea, vomiting  pain, redness, or irritation at site where injected This list may not describe all possible side effects. Call your doctor for medical advice about side effects. You may report side effects to FDA at 1-800-FDA-1088. Where should I keep my medicine? Keep out of the reach of children. Store in a refrigerator between 2 and 8 degrees C (36 and 46 degrees F). Do not freeze or shake. Throw away any unused portion if using a single-dose vial. Multi-dose vials can be kept in the refrigerator for up to 21 days after the initial dose. Throw away unused medicine. NOTE: This sheet is a summary. It may not cover all possible information. If you have questions about this medicine, talk to your doctor, pharmacist, or health care provider.  2020 Elsevier/Gold Standard (2016-10-12 08:35:19) Coronavirus (COVID-19) Are you at risk?  Are you at risk for the Coronavirus (COVID-19)?  To be considered HIGH RISK for Coronavirus (COVID-19), you have to meet the following criteria:  . Traveled to [place], [place], [place], [place] or [place]; or in the [place] to [place], [place], [place], or [place]; and have fever, cough, and shortness of breath within the last 2 weeks of travel OR . Been in close contact with a person diagnosed with COVID-19 within the last 2 weeks and have fever, cough, and shortness of breath . IF YOU DO NOT MEET THESE CRITERIA, YOU ARE CONSIDERED LOW RISK FOR COVID-19.  What to do if you are HIGH RISK for COVID-19?  [name] If you are having a medical emergency, call 911. . Seek medical care right away. Before you go to a doctor's office, urgent care or emergency department, call ahead and tell them about your recent travel, contact with someone diagnosed with COVID-19, and your symptoms. You should receive instructions from your physician's office regarding  next steps of care.  . When you arrive at healthcare provider, tell the healthcare staff immediately you have returned from visiting [place], [place], [place], [place] or [place]; or traveled in the [place] to [place], [place], [place], or [place]; in the last two weeks or you have been in close contact with a person diagnosed with COVID-19 in the last 2 weeks.   . Tell the health care staff about your symptoms: fever, cough and shortness of breath. . After you have been seen by a medical provider, you will be either: o Tested for (COVID-19) and discharged home on quarantine except to seek medical care if symptoms worsen, and asked to  - Stay home and avoid contact with others until you get your results (4-5 days)  - Avoid travel on public transportation if possible (such as bus, train, or airplane) or o Sent to the Emergency Department by EMS for evaluation, COVID-19 testing, and possible admission depending on your condition and test results.  What to do if you are LOW RISK for COVID-19?  Reduce your risk of any infection by using the same precautions used for  avoiding the common cold or flu:  [name] Wash your hands often with soap and warm water for at least 20 seconds.  If soap and water are not readily available, use an alcohol-based hand sanitizer with at least 60% alcohol.  . If coughing or sneezing, cover your mouth and nose by coughing or sneezing into the elbow areas of your shirt or coat, into a tissue or into your sleeve (not your hands). . Avoid shaking hands with others and consider head nods or verbal greetings only. . Avoid touching your eyes, nose, or mouth with unwashed hands.  . Avoid close contact with people who are sick. . Avoid places or events with large numbers of people in one location, like concerts or sporting events. . Carefully consider travel plans you have or are making. . If you are planning any travel outside or inside the [place], visit the CDC's Travelers'  Health webpage for the latest health notices. . If you have some symptoms but not all symptoms, continue to monitor at home and seek medical attention if your symptoms worsen. . If you are having a medical emergency, call 911.   [place] / e-Visit: [email or web address]         [facility]: [facility]: [phone]                   [facility]: [phone]

## 2019-02-13 ENCOUNTER — Other Ambulatory Visit: Payer: Self-pay | Admitting: Cardiology

## 2019-02-16 ENCOUNTER — Telehealth: Payer: Self-pay

## 2019-02-16 NOTE — Telephone Encounter (Signed)
Patient called requesting new pain medication as the last one she states is not working.  Noted in last note:  If this does not help would try lyrica and if lyrica is not helpful would try low dose tramadol with food  Please call if you ned to try another medicine

## 2019-02-17 MED ORDER — PREGABALIN 75 MG PO CAPS: 75.0000 mg | ORAL_CAPSULE | Freq: Two times a day (BID) | ORAL | 1 refills | Status: DC

## 2019-02-19 MED ORDER — PREGABALIN 75 MG PO CAPS: 75.0000 mg | ORAL_CAPSULE | Freq: Two times a day (BID) | ORAL | 0 refills | Status: DC

## 2019-02-19 NOTE — Telephone Encounter (Signed)
Lyrica 16m po BID #60 NO RF

## 2019-02-19 NOTE — Telephone Encounter (Signed)
Called to pharmacy and [name] notified.

## 2019-02-21 ENCOUNTER — Other Ambulatory Visit: Payer: Self-pay | Admitting: Physical Medicine & Rehabilitation

## 2019-02-21 DIAGNOSIS — M48062 Spinal stenosis, lumbar region with neurogenic claudication: Secondary | ICD-10-CM

## 2019-02-21 DIAGNOSIS — M47816 Spondylosis without myelopathy or radiculopathy, lumbar region: Secondary | ICD-10-CM

## 2019-02-23 ENCOUNTER — Inpatient Hospital Stay: Payer: Medicare Other

## 2019-02-23 ENCOUNTER — Other Ambulatory Visit: Payer: Self-pay

## 2019-02-23 ENCOUNTER — Inpatient Hospital Stay: Payer: Medicare Other | Attending: Internal Medicine

## 2019-02-23 VITALS — BP 145/61 | HR 76 | Temp 98.2°F | Resp 18

## 2019-02-23 DIAGNOSIS — D631 Anemia in chronic kidney disease: Secondary | ICD-10-CM | POA: Diagnosis present

## 2019-02-23 DIAGNOSIS — D509 Iron deficiency anemia, unspecified: Secondary | ICD-10-CM

## 2019-02-23 DIAGNOSIS — Z853 Personal history of malignant neoplasm of breast: Secondary | ICD-10-CM

## 2019-02-23 DIAGNOSIS — N184 Chronic kidney disease, stage 4 (severe): Secondary | ICD-10-CM | POA: Insufficient documentation

## 2019-02-23 LAB — CBC WITH DIFFERENTIAL/PLATELET
Abs Immature Granulocytes: 0.02 10*3/uL (ref 0.00–0.07)
Basophils Absolute: 0 10*3/uL (ref 0.0–0.1)
Basophils Relative: 0 %
Eosinophils Absolute: 0.1 10*3/uL (ref 0.0–0.5)
Eosinophils Relative: 1 %
HCT: 32.8 % — ABNORMAL LOW (ref 36.0–46.0)
Hemoglobin: 10.2 g/dL — ABNORMAL LOW (ref 12.0–15.0)
Immature Granulocytes: 0 %
Lymphocytes Relative: 21 %
Lymphs Abs: 1.6 10*3/uL (ref 0.7–4.0)
MCH: 27.7 pg (ref 26.0–34.0)
MCHC: 31.1 g/dL (ref 30.0–36.0)
MCV: 89.1 fL (ref 80.0–100.0)
Monocytes Absolute: 0.5 10*3/uL (ref 0.1–1.0)
Monocytes Relative: 6 %
Neutro Abs: 5.4 10*3/uL (ref 1.7–7.7)
Neutrophils Relative %: 72 %
Platelets: 248 10*3/uL (ref 150–400)
RBC: 3.68 MIL/uL — ABNORMAL LOW (ref 3.87–5.11)
RDW: 15.6 % — ABNORMAL HIGH (ref 11.5–15.5)
WBC: 7.7 10*3/uL (ref 4.0–10.5)
nRBC: 0 % (ref 0.0–0.2)

## 2019-02-23 MED ORDER — EPOETIN ALFA-EPBX 10000 UNIT/ML IJ SOLN
10000.0000 [IU] | Freq: Once | INTRAMUSCULAR | Status: AC
  Administered 2019-02-23: 10000 [IU] via SUBCUTANEOUS

## 2019-02-23 MED ORDER — EPOETIN ALFA-EPBX 10000 UNIT/ML IJ SOLN
INTRAMUSCULAR | Status: AC
  Filled 2019-02-23: qty 1

## 2019-03-06 ENCOUNTER — Inpatient Hospital Stay: Payer: Medicare Other

## 2019-03-06 ENCOUNTER — Inpatient Hospital Stay: Payer: Medicare Other | Admitting: Hematology

## 2019-03-09 ENCOUNTER — Telehealth: Payer: Self-pay | Admitting: Internal Medicine

## 2019-03-09 ENCOUNTER — Telehealth: Payer: Self-pay | Admitting: Cardiovascular Disease

## 2019-03-09 DIAGNOSIS — R197 Diarrhea, unspecified: Secondary | ICD-10-CM

## 2019-03-09 NOTE — Telephone Encounter (Signed)
Left message for patient to call back  

## 2019-03-09 NOTE — Telephone Encounter (Signed)
New Message   Patient sent a message to mychart advising that she is having elevated blood pressure and lower extremity swelling. Please contact patient.

## 2019-03-09 NOTE — Telephone Encounter (Signed)
Called patient back about message. Patient complaining of swelling and elevated BP for about a month. Patient stated her SBP stays between 160 to 200. Patient stated a week ago her nephrologist put her on Lasix 40 mg daily, but it has not helped. Informed patient that a message would be sent to Dr. Johnsie Cancel for advisement, but she also needs talk to her Nephrologist. Patient agreed to plan.

## 2019-03-09 NOTE — Telephone Encounter (Signed)
Patient reports that she completed antibiotics on Saturday for a recent UTI prescribed by her PCP.  She has been having diarrhea that started yesterday 4-5 loose stools.  Denies rectal bleeding.  Imodium is not helping as it has to control her loose stools. Denies rectal bleeding, nausea, or other symptoms.  Please advise

## 2019-03-09 NOTE — Telephone Encounter (Signed)
Her nephrologist should handle this

## 2019-03-10 ENCOUNTER — Other Ambulatory Visit: Payer: Medicare Other

## 2019-03-10 ENCOUNTER — Other Ambulatory Visit: Payer: Self-pay | Admitting: Internal Medicine

## 2019-03-10 MED ORDER — DIPHENOXYLATE-ATROPINE 2.5-0.025 MG PO TABS: 1.0000 | ORAL_TABLET | Freq: Four times a day (QID) | ORAL | 0 refills | Status: DC | PRN

## 2019-03-10 NOTE — Telephone Encounter (Signed)
Patient notified of new rx and recommendations.  She will come do stool studies

## 2019-03-10 NOTE — Telephone Encounter (Signed)
If she is still having watery diarrhea then do a C diff test  If she is still symptomatic also I will Rx something but let's see what is going on today

## 2019-03-11 ENCOUNTER — Other Ambulatory Visit: Payer: Medicare Other

## 2019-03-11 DIAGNOSIS — R197 Diarrhea, unspecified: Secondary | ICD-10-CM

## 2019-03-11 NOTE — Telephone Encounter (Signed)
   Pt c/o swelling: STAT is pt has developed SOB within 24 hours  1) How much weight have you gained and in what time span? 3 lbs overnight, 5 lbs since Monday  2) If swelling, where is the swelling located? Lower extremities   3) Are you currently taking a fluid pill? Yes Lasix 40 mg daily  4) Are you currently SOB? no  5) Do you have a log of your daily weights (if so, list)?   Saturday 181.1  Sunday 183.2  Monday 178.2  Tuesday 180  Wednesday 183.6  6) Have you gained 3 pounds in a day or 5 pounds in a week? both  7) Have you traveled recently? no  Ginger, Charity fundraiser called. The patient is in a program where she reports her weight and BP to the nurses every day. [facility] nurse called because the patient has reported a 3 lb weight gain overnight and a 5 lb weight gain since Monday.   Ginger states that the patient reached out to the office earlier in the week about the same issue.   Ginger is not allowed to take orders over the phone. If any changes need to be made, the patient needs to be contacted.

## 2019-03-11 NOTE — Telephone Encounter (Signed)
Spoke with patient, pt reports some swelling in her feet, states "it is harder to get shoes on"  Denies SOB or new onset cough.  She is taking Lasix 44m daily as directed by Nephrology.  Advised per Dr. NJohnsie Cancelpt should contact Nephrology to determine if changes in diuretic are needed.

## 2019-03-12 ENCOUNTER — Telehealth: Payer: Self-pay | Admitting: Internal Medicine

## 2019-03-12 LAB — CLOSTRIDIUM DIFFICILE TOXIN B, QUALITATIVE, REAL-TIME PCR: Toxigenic C. Difficile by PCR: NOT DETECTED

## 2019-03-12 MED ORDER — BUDESONIDE 3 MG PO CPEP: 9.0000 mg | ORAL_CAPSULE | ORAL | 1 refills | Status: DC

## 2019-03-12 NOTE — Telephone Encounter (Signed)
C. difficile test was negative Lomotil not helping too much.  Has diabetes.  We will try budesonide 9 mg daily to see if that helps.  She is to contact me after the weekend with an update.

## 2019-03-16 DIAGNOSIS — I10 Essential (primary) hypertension: Secondary | ICD-10-CM

## 2019-03-16 DIAGNOSIS — Z79899 Other long term (current) drug therapy: Secondary | ICD-10-CM

## 2019-03-16 MED ORDER — FUROSEMIDE 40 MG PO TABS: 40.0000 mg | ORAL_TABLET | Freq: Two times a day (BID) | ORAL | 3 refills | Status: DC

## 2019-03-16 MED ORDER — HYDRALAZINE HCL 50 MG PO TABS: 50.0000 mg | ORAL_TABLET | Freq: Two times a day (BID) | ORAL | 3 refills | Status: DC

## 2019-03-16 NOTE — Telephone Encounter (Signed)
Called patient back with Dr. Johnsie Cancel recommendations. Per Dr. Johnsie Cancel, Increase her hydralazine to 50 bid and lasix to 40 bid BMET in 2 weeks and f/u with pharm D and renal We wanted renal to adjust her diuretic as she has had renal failure on higher doses with Cr over 4. Patient will get BMET on 03/31/19 and see pharm D the same day. Sent medications to patient's local pharmacy as requested.

## 2019-03-23 ENCOUNTER — Inpatient Hospital Stay: Payer: Medicare Other

## 2019-03-23 ENCOUNTER — Other Ambulatory Visit: Payer: Self-pay

## 2019-03-23 ENCOUNTER — Inpatient Hospital Stay: Payer: Medicare Other | Attending: Internal Medicine

## 2019-03-23 VITALS — BP 142/68 | HR 72 | Temp 98.2°F | Resp 18

## 2019-03-23 DIAGNOSIS — N184 Chronic kidney disease, stage 4 (severe): Secondary | ICD-10-CM | POA: Insufficient documentation

## 2019-03-23 DIAGNOSIS — D631 Anemia in chronic kidney disease: Secondary | ICD-10-CM | POA: Insufficient documentation

## 2019-03-23 DIAGNOSIS — Z853 Personal history of malignant neoplasm of breast: Secondary | ICD-10-CM

## 2019-03-23 DIAGNOSIS — D509 Iron deficiency anemia, unspecified: Secondary | ICD-10-CM

## 2019-03-23 LAB — CBC WITH DIFFERENTIAL/PLATELET
Abs Immature Granulocytes: 0.25 10*3/uL — ABNORMAL HIGH (ref 0.00–0.07)
Basophils Absolute: 0.1 10*3/uL (ref 0.0–0.1)
Basophils Relative: 0 %
Eosinophils Absolute: 0.1 10*3/uL (ref 0.0–0.5)
Eosinophils Relative: 1 %
HCT: 30.8 % — ABNORMAL LOW (ref 36.0–46.0)
Hemoglobin: 9.8 g/dL — ABNORMAL LOW (ref 12.0–15.0)
Immature Granulocytes: 2 %
Lymphocytes Relative: 19 %
Lymphs Abs: 2.4 10*3/uL (ref 0.7–4.0)
MCH: 28 pg (ref 26.0–34.0)
MCHC: 31.8 g/dL (ref 30.0–36.0)
MCV: 88 fL (ref 80.0–100.0)
Monocytes Absolute: 0.8 10*3/uL (ref 0.1–1.0)
Monocytes Relative: 6 %
Neutro Abs: 9 10*3/uL — ABNORMAL HIGH (ref 1.7–7.7)
Neutrophils Relative %: 72 %
Platelets: 229 10*3/uL (ref 150–400)
RBC: 3.5 MIL/uL — ABNORMAL LOW (ref 3.87–5.11)
RDW: 16.1 % — ABNORMAL HIGH (ref 11.5–15.5)
WBC: 12.5 10*3/uL — ABNORMAL HIGH (ref 4.0–10.5)
nRBC: 0 % (ref 0.0–0.2)

## 2019-03-23 MED ORDER — EPOETIN ALFA-EPBX 10000 UNIT/ML IJ SOLN
INTRAMUSCULAR | Status: AC
  Filled 2019-03-23: qty 1

## 2019-03-23 MED ORDER — EPOETIN ALFA-EPBX 10000 UNIT/ML IJ SOLN
10000.0000 [IU] | Freq: Once | INTRAMUSCULAR | Status: AC
  Administered 2019-03-23: 11:00:00 10000 [IU] via SUBCUTANEOUS

## 2019-03-23 NOTE — Patient Instructions (Signed)

## 2019-03-26 ENCOUNTER — Telehealth: Payer: Self-pay | Admitting: Cardiovascular Disease

## 2019-03-26 NOTE — Telephone Encounter (Signed)
Called patient about [facility]'s message. Patient stated she started taking medications as prescribed when we last talked on the phone. Patient stated she has not seen much of a difference. Patient stated her swelling seems to be even worse. Patient stated she has not had more out put (urine) with medication changes. Consulted Dr. Johnsie Cancel, he stated she needs to be seen by her kidney doctor, that her HTN is due to her kidneys.  Tried to call patient back. Left message for patient to call back.

## 2019-03-26 NOTE — Telephone Encounter (Signed)
Patient aware of Dr. Kyla Balzarine recommendations to see her kidney doctor ASAP for HTN and weight gain.

## 2019-03-26 NOTE — Telephone Encounter (Signed)
New Message   Ginger from [facility] care is calling to report Blood Pressure readings for the patient   BP After medication  Dec 31 161/80  Jan 1    150/69 Jan 2  164/80 Jan 3   144-31 Jan 4    165/77  Before Medication  Jan 5     166/81 Jan 6   178/81 Jan 7   187/78  Ginger says the pt is not experiencing any other symptoms. And that she has had a 8 Pound weight Gain     Please call Patient and advise

## 2019-03-30 NOTE — Progress Notes (Signed)
Patient ID: [name]                 DOB: 1945-11-24                      MRN: [id number]     HPI: [name] is a 74 y.o. female referred by Dr. Johnsie Cancel to HTN clinic. PMH is significant for labile HTN, CAD, chronic venous insufficiency, HLD, T2DM, CKD stage 3, hyperparathyroidism, anemia, and hx of breast cancer. CAD with pevious rotoblator to the LAD in 2000.Myovue on 11/2006 showed mild apical thinning and was low risk. ECHO on 09/04/18 showed EF 60-65% with strain -19.6 and moderate asymmetric left ventricular hypertrophy.  On 03/09/19 patient complains of swelling and elevated BP w/ systolic 237-628 for past month and swelling in her feet. Advised by Dr. Johnsie Cancel to contact her nephrologist. Dr. Johnsie Cancel increased hydralazine to 50 mg twice a day and furosemide 40 mg twice a day on 03/16/19. The patient's blood pressure readings from first week of January were elevated (avg systolic in 315'V) despite taking medications. She also states that the swelling is worse and still has low urine output. Dr. Johnsie Cancel advised her to see her nephrologist, since her HTN is related to renal dysfunction.  Patient arrives to clinic today for HTN therapy management. She is doing well, but still waiting on an appointment with her nephrologist. She states that in the past month there has been no improvement in the leg and feet swelling and it continues to get worse throughout the day. She reports the frequency of urination has not changed, even with increased Lasix dose of 40 mg twice daily. She wears compression stockings more often, but sometimes they get too tight for her to wear. She reports dyspnea on exertion, so exercise is minimal. She normally checks her blood pressure before medications. This morning her SBP was 189, but after taking medications her SBP was 147. She reports occasional dizziness and blurred vision. She also complains of imbalance when switching positions, and a headaches in the morning  before taking medications. She is adherent to all medications and uses a pill box. The patient reports eating a low sodium diet and small fluid intake. Her blood pressure in clinic today was 180/80, and 162/76 on repeat.  Current HTN meds: amlodipine 10 mg once daily, carvedilol 6.25 mg twice a day, furosemide 40 mg twice a day, hydralazine 50 mg twice a day, Imdur 15 mg once a day Previously tried: atenolol 50 mg (switched to carvedilol), lisinopril 10 mg (elevated Scr), quinapril 10 mg (switched to lisinopril) BP goal: < 130/80 mmHg  Family History: The patient's family history includes Breast cancer in her maternal aunt and maternal grandmother; Breast cancer (age of onset: 72) in her mother; Clotting disorder in her maternal grandmother; Colon cancer in her maternal grandmother; Diabetes in her maternal grandmother; Heart attack in her maternal grandmother; Hypertension in her daughter; Kidney failure in her maternal aunt; Lung cancer in her maternal aunt; Pancreatic cancer (age of onset: 30) in her brother; Prostate cancer in her maternal uncle.  Social History: No alcohol, tobacco, or drug use  Diet: Limited salt and fluid intake. Breakfast- eggs with toast, grits or oatmeal. Lunch- cheese and crackers. Dinner- green beans, rice, and steak. Fluid intake- 2 cups of water in morning, 1 cup of lemonade with lunch, and a cup of water at night.  Exercise: Limited  Wt Readings from Last 3 Encounters:  02/03/19 181 lb (  82.1 kg)  12/19/18 181 lb (82.1 kg)  12/15/18 185 lb 8 oz (84.1 kg)   BP Readings from Last 3 Encounters:  03/23/19 (!) 142/68  02/23/19 (!) 145/61  02/09/19 (!) 183/71   Pulse Readings from Last 3 Encounters:  03/23/19 72  02/23/19 76  02/09/19 72    Renal function: CrCl cannot be calculated (Patient's most recent lab result is older than the maximum 21 days allowed.).  Past Medical History:  Diagnosis Date  . Abdominal discomfort    Chronic N/V/D. Presumptive dx  Crohn's dx per elevated p ANCA. Failed Entocort and Pentasa. Sep 2003 - ileocolectomy c anastomosis per Dr Deon Pilling 2/2 adhesions - path was hegative for Crohns. EGD, Sm bowel follow through (11/03), and an eteroclysis (10/03) were unrevealing. Cuases hypomag and hypocalcemia.  . Adnexal mass 8/03   s/p lap BSO (R ovarian fibroma) & lysis of adhesions  . Allergy    Seasonal  . Anemia    Multifactorial. Baseline HgB 10-11 ish. B12 def - 150 in 3/10. Fe Def - ferritin 35 3/10. Both are being repleted.  . Breast cancer ([place]) 03/16/13   right, 5 o'clock  . CAD (coronary artery disease) 1996   1996 - PTCA and angioplasty diagonal branch. 2000 - Rotoblator & angiopllasty of diagonal. 2006 - subendocardial AMI, DES to proximal LAD.Marland Kitchen Also had 90% stenosis in distal apical LAD. EF 55 with apical hypokinesis. Indefinite ASA and Plavix.  . CHF (congestive heart failure) ([place])   . Chronic kidney disease    Chronic renal insuff baseline Cr 1.2 - 1.4 ish.  . Chronic pain    CT 10/10 = Spinal stenosis L2 - S1.  . Diabetes mellitus    Insulin dependent  . Hx of radiation therapy 06/02/13- 07/16/13   right rbeast 4500 cGy 25 sessions, right breast boost 1600 cGy in 8 sessions  . Hyperlipidemia    Managed with both a statin and Welchol. Welchol stopped 2014 2/2 cost and started on fenofibrate   . Hypertension    2006 B renal arteries patent. 2003 MRA - no RAS. 2003 pheo W/U Dr Hassell Done reportedly negative.  . Hypoxia 07/23/2017  . Lupus ([place])   . Personal history of radiation therapy   . RBBB   . SBO (small bowel obstruction) ([place]) 09/17/2017  . Stroke [facility])    Incidental finding MRI 2002 L lacunar infarct  . Wears dentures    top    Current Outpatient Medications on File Prior to Visit  Medication Sig Dispense Refill  . acetaminophen (TYLENOL) 325 MG tablet Take 650 mg by mouth every 6 (six) hours as needed for mild pain.    Marland Kitchen amLODipine (NORVASC) 10 MG tablet Take 1 tablet (10 mg total) by mouth daily.  30 tablet 0  . budesonide (ENTOCORT EC) 3 MG 24 hr capsule Take 3 capsules (9 mg total) by mouth every morning. 90 capsule 1  . carvedilol (COREG) 6.25 MG tablet Take 6.25 mg by mouth 2 (two) times daily.    . cyclobenzaprine (FLEXERIL) 5 MG tablet Take 1 tablet (5 mg total) by mouth at bedtime as needed for muscle spasms. (Patient not taking: Reported on 02/03/2019) 15 tablet 0  . desloratadine (CLARINEX) 5 MG tablet Take 5 mg by mouth at bedtime as needed (allergies). Reported on 04/11/2015    . diphenoxylate-atropine (LOMOTIL) 2.5-0.025 MG tablet Take 1 tablet by mouth 4 (four) times daily as needed for diarrhea or loose stools. 60 tablet 0  . ferrous fumarate (  HEMOCYTE - 106 MG FE) 325 (106 Fe) MG TABS tablet Take 1 tablet by mouth as directed.    . furosemide (LASIX) 40 MG tablet Take 1 tablet (40 mg total) by mouth 2 (two) times daily. 180 tablet 3  . gabapentin (NEURONTIN) 300 MG capsule Take 1 capsule by mouth at bedtime 30 capsule 0  . hydrALAZINE (APRESOLINE) 50 MG tablet Take 1 tablet (50 mg total) by mouth 2 (two) times daily. 180 tablet 3  . isosorbide mononitrate (IMDUR) 30 MG 24 hr tablet Take 1/2 (one-half) tablet by mouth once daily 45 tablet 6  . LANTUS SOLOSTAR 100 UNIT/ML Solostar Pen SMARTSIG:14 Unit(s) SUB-Q Every Night    . linagliptin (TRADJENTA) 5 MG TABS tablet Take 5 mg by mouth daily.    . methylPREDNISolone (MEDROL DOSEPAK) 4 MG TBPK tablet follow package directions 21 tablet 0  . nitroGLYCERIN (NITROSTAT) 0.4 MG SL tablet DISSOLVE 1 TABLET UNDER THE TONGUE EVERY 5 MINUTES AS  NEEDED FOR CHEST PAIN. MAX  OF 3 TABLETS IN 15 MINUTES. CALL 911 IF PAIN PERSISTS. 100 tablet 3  . pregabalin (LYRICA) 75 MG capsule Take 1 capsule (75 mg total) by mouth 2 (two) times daily. 60 capsule 0  . ULORIC 40 MG tablet Take 1 tablet by mouth daily.     No current facility-administered medications on file prior to visit.    Allergies  Allergen Reactions  . Percocet  [Oxycodone-Acetaminophen] Other (See Comments)    headaches  . Diazepam Other (See Comments)    REACTION: Agitation  . Haloperidol Lactate Nausea And Vomiting  . Lorazepam Nausea Only  . Morphine Sulfate Other (See Comments)    REACTION: Agitation  . Propoxyphene Hcl Nausea And Vomiting  . Tramadol Hcl Nausea And Vomiting  . Anesthetics, Halogenated     Per patient's daughter, she can not tolerate anesthetics.   . Etomidate     Heart stopped during anesthesia  . Fentanyl     Heart stopped during anesthesia   . Versed [Midazolam]     Heart stopped during anesthesia     Assessment/Plan:  1. Hypertension - Blood pressure is above goal of <130/80. Increase carvedilol to 12.5 mg twice a day and increase hydralazine to 50 mg three times a day (every 8 hours). Advised patient to wear compression stockings and prop legs up to help with swelling. Discussed proper technique for taking blood pressure and advised her to bring cuff and blood pressure readings to the next appointment. Encouraged patient to check blood pressure 2 hours after taking medications. BMET checked today and will follow-up in 2-3 weeks in office.   Pt seen with Julieta Bellini, PharmD Candidate  Megan E. Supple, PharmD, BCACP, [place] [address], [place], [place] [address] Phone: [phone]; Fax: [phone] 03/31/2019 10:44 AM

## 2019-03-31 ENCOUNTER — Other Ambulatory Visit: Payer: Medicare Other

## 2019-03-31 ENCOUNTER — Ambulatory Visit (INDEPENDENT_AMBULATORY_CARE_PROVIDER_SITE_OTHER): Payer: Medicare Other | Admitting: Pharmacist

## 2019-03-31 ENCOUNTER — Encounter: Payer: Self-pay | Admitting: Physical Medicine & Rehabilitation

## 2019-03-31 ENCOUNTER — Encounter: Payer: Medicare Other | Attending: Physical Medicine & Rehabilitation | Admitting: Physical Medicine & Rehabilitation

## 2019-03-31 ENCOUNTER — Other Ambulatory Visit: Payer: Self-pay

## 2019-03-31 ENCOUNTER — Ambulatory Visit: Payer: Medicare Other

## 2019-03-31 VITALS — BP 170/67 | HR 80 | Temp 97.7°F | Ht 65.0 in | Wt 189.0 lb

## 2019-03-31 VITALS — BP 162/76 | HR 74

## 2019-03-31 DIAGNOSIS — M5416 Radiculopathy, lumbar region: Secondary | ICD-10-CM | POA: Diagnosis not present

## 2019-03-31 DIAGNOSIS — I10 Essential (primary) hypertension: Secondary | ICD-10-CM | POA: Diagnosis not present

## 2019-03-31 DIAGNOSIS — M5417 Radiculopathy, lumbosacral region: Secondary | ICD-10-CM | POA: Diagnosis not present

## 2019-03-31 LAB — BASIC METABOLIC PANEL
BUN/Creatinine Ratio: 29 — ABNORMAL HIGH (ref 12–28)
BUN: 46 mg/dL — ABNORMAL HIGH (ref 8–27)
CO2: 25 mmol/L (ref 20–29)
Calcium: 9.1 mg/dL (ref 8.7–10.3)
Chloride: 102 mmol/L (ref 96–106)
Creatinine, Ser: 1.61 mg/dL — ABNORMAL HIGH (ref 0.57–1.00)
GFR calc Af Amer: 37 mL/min/{1.73_m2} — ABNORMAL LOW (ref >59)
GFR calc non Af Amer: 32 mL/min/{1.73_m2} — ABNORMAL LOW (ref >59)
Glucose: 85 mg/dL (ref 65–99)
Potassium: 3.9 mmol/L (ref 3.5–5.2)
Sodium: 141 mmol/L (ref 134–144)

## 2019-03-31 MED ORDER — CARVEDILOL 12.5 MG PO TABS: 12.5000 mg | ORAL_TABLET | Freq: Two times a day (BID) | ORAL | 11 refills | Status: DC

## 2019-03-31 NOTE — Progress Notes (Signed)
Lumbar L4 Selective nerve root block under fluoroscopic guidance with contrast enhancement  Indication: Lumbosacral radiculitis is not relieved by medication management or other conservative care and interfering with self-care and mobility. Pt has stenosis of both L3-4 and L4-5 on Right with Right lateral thigh and leg pain   Informed consent was obtained after describing risk and benefits of the procedure with the patient, this includes bleeding, bruising, infection, paralysis and medication side effects.  The patient wishes to proceed and has given written consent.  Patient was placed in prone position.  The lumbar area was marked and prepped with Betadine.  It was entered with a 25-gauge 1-1/2 inch needle and one mL of 1% lidocaine was injected into the skin and subcutaneous tissue.  Then a 22-gauge 3.5 spinal needle was inserted into the $ight L4-5  intervertebral foramen under AP, lateral, and oblique view.  Once needle tip was within the foramen on lateral views an dnor exceeding 6 o clock position on th epedical on AP viewed Isovue 200 was inected x 65m Then a solution containing one mL of 10 mg per mL dexamethasone and 2 mL of 1% lidocaine was injected.  The patient tolerated procedure well.  Post procedure instructions were given.  Please see post procedure form.  F/u 1 month to determine clinical effect, may need L3 Transforaminal ESI as well

## 2019-03-31 NOTE — Patient Instructions (Signed)
You received an epidural steroid injection under fluoroscopic guidance. This is the most accurate way to perform an epidural injection. This injection was performed to relieve thigh or leg or foot pain that may be related to a pinched nerve in the lumbar spine. The local anesthetic injected today may cause numbness in your leg for a couple hours. If it is severe we may need to observe you for 30-60 minutes after the injection. The cortisone medicine injected today may take several days to take full effect. This medicine can also cause facial flushing or feeling of being warm.  This injection may last for days weeks or months. It can be repeated if needed. If it is not effective, another spinal level may need to be injected. Other treatments include medication management as well as physical therapy. In some cases surgery may be an option.  

## 2019-03-31 NOTE — Patient Instructions (Addendum)
It was nice to see you today  Your blood pressure goal is less than 130/90mHg  Increase your carvedilol to 12.510mtwice daily. You can take 2 of your 6.2555mablets twice a day until you run out, then pick up your new prescription for the 12.5mg67mblets and take 1 tablet twice a day  Increase your hydralazine from 1 tablet 2x a day to 1 tablet 3x a day - try to space apart your doses every 8 hours the best you can   Continue taking your other medications  Continue to monitor your blood pressure at home - check your readings 1-2 hours after taking your medications  Limit the salt in your diet to less than 2,000mg74mly  Elevate your legs and wear compression stockings to help with the swelling  Record your blood pressure readings and bring them along with your home cuff to your next visit in 2-3 weeks

## 2019-03-31 NOTE — Progress Notes (Signed)
  PROCEDURE RECORD [facility]   Name: [name] DOB:[birth year] MRN: [id number]  Date:03/31/2019  Physician: Alysia Penna, MD    Nurse/CMA: Wesling, CMA   Allergies:  Allergies  Allergen Reactions  . Percocet [Oxycodone-Acetaminophen] Other (See Comments)    headaches  . Diazepam Other (See Comments)    REACTION: Agitation  . Haloperidol Lactate Nausea And Vomiting  . Lorazepam Nausea Only  . Morphine Sulfate Other (See Comments)    REACTION: Agitation  . Propoxyphene Hcl Nausea And Vomiting  . Tramadol Hcl Nausea And Vomiting  . Anesthetics, Halogenated     Per patient's daughter, she can not tolerate anesthetics.   . Etomidate     Heart stopped during anesthesia  . Fentanyl     Heart stopped during anesthesia   . Versed [Midazolam]     Heart stopped during anesthesia    Consent Signed: Yes.    Is patient diabetic? Yes.    CBG today? 19  Pregnant: No. LMP: No LMP recorded. Patient has had a hysterectomy. (age 25-55)  Anticoagulants: no Anti-inflammatory: no Antibiotics: no  Procedure: right L4 transforaminal epidural steroid injection  Position: Prone Start Time:  2:28pm     End Time: 2:36pm  Fluoro Time: 36s  RN/CMA Aveya Beal, CMA Larine Fielding, CMA    Time 2:10pm 2:41pm    BP 170/67 184/67    Pulse 80 84    Respirations 14 14    O2 Sat 97 98    S/S 6 6    Pain Level 8/10 4/10     D/C home with granddaughter, patient A & O X 3, D/C instructions reviewed, and sits independently.

## 2019-04-01 ENCOUNTER — Telehealth: Payer: Self-pay

## 2019-04-01 ENCOUNTER — Telehealth: Payer: Self-pay | Admitting: Hematology

## 2019-04-01 NOTE — Telephone Encounter (Signed)
Ms Kimm called asking if she needed any more shots.  Review of the chart reveals she was getting retacrit.  Also noted Dr. Burr Medico wanted to see her in 02/2019 and she needed to have a mammogram 03/2019.  I shared this information with Ms. Johnnette Litter and have sent scheduling message for appts next week, lab, Dr. Burr Medico, and injection.  I told Ms Pask she needs to contact [facility] radiology to schedule her mammogram.  She verbalized understanding.

## 2019-04-01 NOTE — Telephone Encounter (Signed)
Scheduled appt per 1/13 sch message - pt aware of appt date and time

## 2019-04-05 ENCOUNTER — Encounter: Payer: Self-pay | Admitting: Nephrology

## 2019-04-05 NOTE — Progress Notes (Signed)
[name]   [phone] Fax:(336) [phone]   Clinic Follow up Note   Patient Care Team: Seward Carol, MD as PCP - General (Internal Medicine) Josue Hector, MD as PCP - Cardiology (Cardiology) Thelma Comp, [place] as Consulting Physician (Optometry) Stark Klein, MD as Consulting Physician (General Surgery) Truitt Merle, MD as Consulting Physician (Hematology) Madelon Lips, MD as Consulting Physician (Nephrology) 04/06/2019  CHIEF COMPLAINT: F/u breast cancer and anemia of chronic disease, IDA   SUMMARY OF ONCOLOGIC HISTORY: Oncology History Overview Note  Cancer Staging Breast cancer of lower-inner quadrant of right female breast [facility]) Staging form: Breast, AJCC 7th Edition - Clinical: Stage IA (T1b, N0, cM0) - Unsigned Staging comments: Staged at breast conference 03/25/13.  - Pathologic stage from 05/12/2013: Stage IA (T1b, N0, cM0) - Signed by Truitt Merle, MD on 06/21/2016     Breast cancer of lower-inner quadrant of right female breast ([name])  02/27/2013 Mammogram   Diagnostic mammogram and ultrasound showed a 6 x 5 mm mass at 5:00 position. Axilla was negative by ultrasound.   03/16/2013 Initial Biopsy   Right breast 5:00 position biopsy showed invasive ductal carcinoma, low-grade   03/16/2013 Receptors her2   ER100% positive, PR 100% positive, HER-2 negative, Ki-67 20%   03/18/2013 Initial Diagnosis   Breast cancer of lower-inner quadrant of right female breast ([place])   05/12/2013 Surgery   Right breast lumpectomy with sentinel lymph node biopsy   05/12/2013 Pathology Results   Right breast lumpectomy showed invasive ductal carcinoma, grade 2, 0.9 cm, 6 sentinel lymph nodes were all negative. Margins were negative.   05/12/2013 Oncotype testing   Oncotype recurrence score 19, intermediate risk, 10 year risk of distant recurrence 12% with tamoxifen for 5 years. Patient declined adjuvant chemotherapy.   06/02/2013 - 07/16/2013 Radiation  Therapy   Adjuvant breast radiation with boost 1600Gy   08/06/2013 - 07/2017 Anti-estrogen oral therapy   Arimidex 1 mg daily. Stopped in 07/2017 due to acute illness    03/22/2017 Mammogram   IMPRESSION: No mammographic evidence for malignancy.     CURRENT THERAPY:  -Retacrit injection starting 09/2018, currently q2weeks  -IV Feraheme as needed starting 09/15/18  -Oral Ferrous sulfate daily  INTERVAL HISTORY: [name] returns for f/u as scheduled. She was last seen 12/15/18. She is doing well in the interim. Appetite remains normal. Energy is good, remains functional. She is dealing with bout of diarrhea, managed by Dr. Carlean Purl having 3-4 episodes daily for "long time." Denies bleeding such as hematochezia, epistaxis, hematuria, or vaginal bleeding. 3 weeks ago she started having right breast itching and pain. Denies fall or injury. No new mass, skin redness, or nipple discharge/inversion. She has mammogram next week. She has mild dyspnea on exertion, resolves with rest. No cough, chest pain, fever, chills. Has occasional swelling in legs. Takes iron tab daily.    MEDICAL HISTORY:  Past Medical History:  Diagnosis Date  . Abdominal discomfort    Chronic N/V/D. Presumptive dx Crohn's dx per elevated p ANCA. Failed Entocort and Pentasa. Sep 2003 - ileocolectomy c anastomosis per Dr Deon Pilling 2/2 adhesions - path was hegative for Crohns. EGD, Sm bowel follow through (11/03), and an eteroclysis (10/03) were unrevealing. Cuases hypomag and hypocalcemia.  . Adnexal mass 8/03   s/p lap BSO (R ovarian fibroma) & lysis of adhesions  . Allergy    Seasonal  . Anemia    Multifactorial. Baseline HgB 10-11 ish. B12 def - 150 in 3/10. Fe Def - ferritin  35 3/10. Both are being repleted.  . Breast cancer ([place]) 03/16/13   right, 5 o'clock  . CAD (coronary artery disease) 1996   1996 - PTCA and angioplasty diagonal branch. 2000 - Rotoblator & angiopllasty of diagonal. 2006 - subendocardial AMI, DES to  proximal LAD.Marland Kitchen Also had 90% stenosis in distal apical LAD. EF 55 with apical hypokinesis. Indefinite ASA and Plavix.  . CHF (congestive heart failure) ([place])   . Chronic kidney disease    Chronic renal insuff baseline Cr 1.2 - 1.4 ish.  . Chronic pain    CT 10/10 = Spinal stenosis L2 - S1.  . Diabetes mellitus    Insulin dependent  . Hx of radiation therapy 06/02/13- 07/16/13   right rbeast 4500 cGy 25 sessions, right breast boost 1600 cGy in 8 sessions  . Hyperlipidemia    Managed with both a statin and Welchol. Welchol stopped 2014 2/2 cost and started on fenofibrate   . Hypertension    2006 B renal arteries patent. 2003 MRA - no RAS. 2003 pheo W/U Dr Hassell Done reportedly negative.  . Hypoxia 07/23/2017  . Lupus ([place])   . Personal history of radiation therapy   . RBBB   . SBO (small bowel obstruction) ([place]) 09/17/2017  . Stroke [facility])    Incidental finding MRI 2002 L lacunar infarct  . Wears dentures    top    SURGICAL HISTORY: Past Surgical History:  Procedure Laterality Date  . ABDOMINAL HYSTERECTOMY    . BILATERAL SALPINGOOPHORECTOMY  8/03   Lap BSO (R ovarian fibroma) and adhesion lysis  . BOWEL RESECTION  2003   ileocolectomy with anastomosis 2/2 adhesions  . BREAST BIOPSY    . BREAST LUMPECTOMY Right   . BREAST LUMPECTOMY WITH NEEDLE LOCALIZATION AND AXILLARY SENTINEL LYMPH NODE BX Right 04/22/2013   Procedure: BREAST LUMPECTOMY WITH NEEDLE LOCALIZATION AND AXILLARY SENTINEL LYMPH NODE BX;  Surgeon: Stark Klein, MD;  Location: [place];  Service: General;  Laterality: Right;  . CARDIAC CATHETERIZATION     2 stents  . CHOLECYSTECTOMY    . COLONOSCOPY    . HEMICOLECTOMY     R sided hemicolectomy  . HERNIA REPAIR     Ventral hernia repair  . PTCA  4/06    I have reviewed the social history and family history with the patient and they are unchanged from previous note.  ALLERGIES:  is allergic to percocet [oxycodone-acetaminophen]; diazepam; haloperidol  lactate; lorazepam; morphine sulfate; propoxyphene hcl; tramadol hcl; anesthetics, halogenated; etomidate; fentanyl; and versed [midazolam].  MEDICATIONS:  Current Outpatient Medications  Medication Sig Dispense Refill  . acetaminophen (TYLENOL) 325 MG tablet Take 650 mg by mouth every 6 (six) hours as needed for mild pain.    Marland Kitchen amLODipine (NORVASC) 10 MG tablet Take 1 tablet (10 mg total) by mouth daily. 30 tablet 0  . budesonide (ENTOCORT EC) 3 MG 24 hr capsule Take 3 capsules (9 mg total) by mouth every morning. 90 capsule 1  . carvedilol (COREG) 12.5 MG tablet Take 1 tablet (12.5 mg total) by mouth 2 (two) times daily. 60 tablet 11  . cyclobenzaprine (FLEXERIL) 5 MG tablet Take 1 tablet (5 mg total) by mouth at bedtime as needed for muscle spasms. 15 tablet 0  . desloratadine (CLARINEX) 5 MG tablet Take 5 mg by mouth at bedtime as needed (allergies). Reported on 04/11/2015    . diphenoxylate-atropine (LOMOTIL) 2.5-0.025 MG tablet Take 1 tablet by mouth 4 (four) times daily as needed for diarrhea  or loose stools. 60 tablet 0  . ferrous fumarate (HEMOCYTE - 106 MG FE) 325 (106 Fe) MG TABS tablet Take 1 tablet by mouth as directed.    . furosemide (LASIX) 40 MG tablet Take 1 tablet (40 mg total) by mouth 2 (two) times daily. (Patient taking differently: Take 120 mg by mouth 2 (two) times daily. Take 2 tabs  in the am and one tab  at noon) 180 tablet 3  . gabapentin (NEURONTIN) 300 MG capsule Take 1 capsule by mouth at bedtime 30 capsule 0  . hydrALAZINE (APRESOLINE) 50 MG tablet Take 1 tablet (50 mg total) by mouth 3 (three) times daily. 180 tablet 3  . hydrOXYzine (ATARAX/VISTARIL) 50 MG tablet Take 50 mg by mouth 2 (two) times daily as needed.    . isosorbide mononitrate (IMDUR) 30 MG 24 hr tablet Take 1/2 (one-half) tablet by mouth once daily 45 tablet 6  . LANTUS SOLOSTAR 100 UNIT/ML Solostar Pen SMARTSIG:14 Unit(s) SUB-Q Every Night    . linagliptin (TRADJENTA) 5 MG TABS tablet Take 5 mg by  mouth daily.    . Melatonin 5 MG TABS Take 10 mg by mouth at bedtime.    . methylPREDNISolone (MEDROL DOSEPAK) 4 MG TBPK tablet follow package directions 21 tablet 0  . nitroGLYCERIN (NITROSTAT) 0.4 MG SL tablet DISSOLVE 1 TABLET UNDER THE TONGUE EVERY 5 MINUTES AS  NEEDED FOR CHEST PAIN. MAX  OF 3 TABLETS IN 15 MINUTES. CALL 911 IF PAIN PERSISTS. 100 tablet 3  . pregabalin (LYRICA) 75 MG capsule Take 1 capsule (75 mg total) by mouth 2 (two) times daily. 60 capsule 0  . ULORIC 40 MG tablet Take 1 tablet by mouth daily.    . colestipol (COLESTID) 1 g tablet Take 2 g by mouth at bedtime.     No current facility-administered medications for this visit.    PHYSICAL EXAMINATION: ECOG PERFORMANCE STATUS: 1 - Symptomatic but completely ambulatory  Vitals:   04/06/19 1429  BP: (!) 159/69  Pulse: 82  Resp: 17  Temp: 99.1 F (37.3 C)  SpO2: 96%   Filed Weights   04/06/19 1429  Weight: 191 lb 12.8 oz (87 kg)    GENERAL:alert, no distress and comfortable SKIN: no rash  EYES:  sclera clear LYMPH:  no palpable cervical or supraclavicular lymphadenopathy LUNGS: clear with normal breathing effort HEART: regular rate & rhythm, no lower extremity edema ABDOMEN: abdomen soft, non-tender and normal bowel sounds NEURO: alert & oriented x 3 with fluent speech Breast exam: asymmetric without nipple discharge or inversion. S/p right lumpectomy. Incisions completely healed. Scattered densities and firmness in the lower aspect of right breast. Mild perspiration inframammary folds. No palpable mass in either breast or axilla that I could appreciate.   LABORATORY DATA:  I have reviewed the data as listed CBC Latest Ref Rng & Units 04/06/2019 03/23/2019 02/23/2019  WBC 4.0 - 10.5 K/uL 8.9 12.5(H) 7.7  Hemoglobin 12.0 - 15.0 g/dL 10.0(L) 9.8(L) 10.2(L)  Hematocrit 36.0 - 46.0 % 32.4(L) 30.8(L) 32.8(L)  Platelets 150 - 400 K/uL 248 229 248     CMP Latest Ref Rng & Units 03/31/2019 01/12/2019 12/29/2018    Glucose 65 - 99 mg/dL 85 119(H) 132(H)  BUN 8 - 27 mg/dL 46(H) 47(H) 38(H)  Creatinine 0.57 - 1.00 mg/dL 1.61(H) 1.74(H) 2.07(H)  Sodium 134 - 144 mmol/L 141 139 140  Potassium 3.5 - 5.2 mmol/L 3.9 4.8 5.5(H)  Chloride 96 - 106 mmol/L 102 109 112(H)  CO2 20 -  29 mmol/L 25 21(L) 20(L)  Calcium 8.7 - 10.3 mg/dL 9.1 9.5 9.8  Total Protein 6.5 - 8.1 g/dL - 7.6 8.0  Total Bilirubin 0.3 - 1.2 mg/dL - 0.2(L) 0.3  Alkaline Phos 38 - 126 U/L - 110 98  AST 15 - 41 U/L - 16 27  ALT 0 - 44 U/L - 31 41      RADIOGRAPHIC STUDIES: I have personally reviewed the radiological images as listed and agreed with the findings in the report. No results found.   ASSESSMENT & PLAN: [name] is a 74 y.o. female with   1. Breast cancer of the lower inner quadrant of the right breast, invasive ductal carcinoma with DCIS, pT1bN0Mo, stage IA, ER+ and PR +, HER 2 negative -Diagnosed in 02/2013 and treated with right lumpectomy, adjuvant radiation andAnastrozolefor 4 years until5/2019.Anastrozole was stopped after she developed septic shock from pneumonia and a cardiac arrest,and we decided not restarted due to the risk of cardiac toxicity.  2. Anemia of chronic disease (CKD), Ironand B12deficiency -04/2018 Colonoscopy with Dr Carlean Purl showed no evidence of GI bleedingor source of bleeding. Continue f/u with GI - 09/09/18 bone marrow biopsyresults, which showed hypercellular marrow, no evidence of MDS or other concerns. -s/p one dose iv feraheme on 09/15/18. Continue oral iron tab 1-2 tab daily  -Her insurance did not approve Aranesp so she started Retacrit every 2 weeks on 11/14/18. She is tolerating well. Goal is Hg 10-11.   3. Low Backpain, from degenerative arthritis and spinal stenosis - f/u with Orthopedics  4. CAD, HTN, DM, CKD stage IV -Continue to follow up withPCP, Dr. [name](cardiology), and Dr. Hollie Salk (nephrology) -Cr and BG improved lately  5. Genetics results  negative  6. Bone Health  -DEXA from 04/2018 shows slightly improved normal bone density with lowest T-score of 0.9 at forearm radius -on calcium and vitamin D  7. Left Renal Vein Thrombosisin 07/2017 -Likely provoked from acute illnessand prior SBO -She stopped Xarelto in 01/2018 -Her hypercoagulopathy work-up in November 2019 was negative  Disposition:  Ms. Sanmiguel appears stable. She continues Retacrit inj q2 weeks and oral iron daily which she tolerates well. Hgb 10.0 today, which is stable. No signs of bleeding. She will receive another injection today and continue q2 weeks. She has not received B12 inj lately, will check B12 at next lab.   From a breast cancer standpoint she is doing well. Breast exam is benign. No clinical concern for recurrence. She will proceed with annual mammogram on 04/17/19 as scheduled. She is aware of the appointment.   She will return for lab and Retacrit inj q2 weeks. F/u in 3 months, or sooner if needed.    Orders Placed This Encounter  Procedures  . Vitamin B12    Standing Status:   Standing    Number of Occurrences:   1    Standing Expiration Date:   04/05/2020   Orders Placed This Encounter  Procedures  . Vitamin B12    Standing Status:   Standing    Number of Occurrences:   1    Standing Expiration Date:   04/05/2020   All questions were answered. The patient knows to call the clinic with any problems, questions or concerns. No barriers to learning was detected. I spent 20 minutes counseling the patient face to face. The total time spent in the appointment was 25 minutes and more than 50% was on counseling and review of test results     Keijuan Schellhase K  Kalman Shan, NP 04/06/19

## 2019-04-06 ENCOUNTER — Encounter: Payer: Self-pay | Admitting: Nurse Practitioner

## 2019-04-06 ENCOUNTER — Inpatient Hospital Stay: Payer: Medicare Other

## 2019-04-06 ENCOUNTER — Other Ambulatory Visit: Payer: Self-pay

## 2019-04-06 ENCOUNTER — Inpatient Hospital Stay (HOSPITAL_BASED_OUTPATIENT_CLINIC_OR_DEPARTMENT_OTHER): Payer: Medicare Other | Admitting: Nurse Practitioner

## 2019-04-06 VITALS — BP 159/69 | HR 82 | Temp 99.1°F | Resp 17 | Ht 65.0 in | Wt 191.8 lb

## 2019-04-06 DIAGNOSIS — C50311 Malignant neoplasm of lower-inner quadrant of right female breast: Secondary | ICD-10-CM | POA: Diagnosis not present

## 2019-04-06 DIAGNOSIS — N184 Chronic kidney disease, stage 4 (severe): Secondary | ICD-10-CM | POA: Diagnosis not present

## 2019-04-06 DIAGNOSIS — Z170 Estrogen receptor positive status [ER+]: Secondary | ICD-10-CM | POA: Diagnosis not present

## 2019-04-06 DIAGNOSIS — D509 Iron deficiency anemia, unspecified: Secondary | ICD-10-CM

## 2019-04-06 DIAGNOSIS — D631 Anemia in chronic kidney disease: Secondary | ICD-10-CM

## 2019-04-06 DIAGNOSIS — Z853 Personal history of malignant neoplasm of breast: Secondary | ICD-10-CM

## 2019-04-06 LAB — CBC WITH DIFFERENTIAL/PLATELET
Abs Immature Granulocytes: 0.06 10*3/uL (ref 0.00–0.07)
Basophils Absolute: 0 10*3/uL (ref 0.0–0.1)
Basophils Relative: 0 %
Eosinophils Absolute: 0.1 10*3/uL (ref 0.0–0.5)
Eosinophils Relative: 1 %
HCT: 32.4 % — ABNORMAL LOW (ref 36.0–46.0)
Hemoglobin: 10 g/dL — ABNORMAL LOW (ref 12.0–15.0)
Immature Granulocytes: 1 %
Lymphocytes Relative: 16 %
Lymphs Abs: 1.5 10*3/uL (ref 0.7–4.0)
MCH: 27.5 pg (ref 26.0–34.0)
MCHC: 30.9 g/dL (ref 30.0–36.0)
MCV: 89.3 fL (ref 80.0–100.0)
Monocytes Absolute: 0.4 10*3/uL (ref 0.1–1.0)
Monocytes Relative: 5 %
Neutro Abs: 6.9 10*3/uL (ref 1.7–7.7)
Neutrophils Relative %: 77 %
Platelets: 248 10*3/uL (ref 150–400)
RBC: 3.63 MIL/uL — ABNORMAL LOW (ref 3.87–5.11)
RDW: 14.7 % (ref 11.5–15.5)
WBC: 8.9 10*3/uL (ref 4.0–10.5)
nRBC: 0 % (ref 0.0–0.2)

## 2019-04-06 MED ORDER — EPOETIN ALFA-EPBX 10000 UNIT/ML IJ SOLN
10000.0000 [IU] | Freq: Once | INTRAMUSCULAR | Status: AC
  Administered 2019-04-06: 10000 [IU] via SUBCUTANEOUS

## 2019-04-06 MED ORDER — EPOETIN ALFA-EPBX 10000 UNIT/ML IJ SOLN
INTRAMUSCULAR | Status: AC
  Filled 2019-04-06: qty 1

## 2019-04-06 NOTE — Patient Instructions (Signed)

## 2019-04-07 ENCOUNTER — Telehealth: Payer: Self-pay | Admitting: Hematology

## 2019-04-07 NOTE — Telephone Encounter (Signed)
Scheduled appt per 1/18 los.  Spoke with pt and they are aware of the appt date and time.

## 2019-04-08 ENCOUNTER — Other Ambulatory Visit: Payer: Medicare Other

## 2019-04-15 NOTE — Progress Notes (Signed)
Patient ID: [name]                 DOB: 1945-08-26                      MRN: [id number]     HPI: [name] is a 74 y.o. female referred by Dr. Johnsie Cancel to HTN clinic. PMH is significant for labile HTN, CAD, chronic venous insufficiency, HLD, T2DM, CKD stage 3, hyperparathyroidism, anemia, and hx of breast cancer. CAD with previous rotoblator to the LAD in 2000.Myovue on 11/2006 showed mild apical thinning and was low risk. ECHO on 09/04/18 showed EF 60-65% with strain -19.6 and moderate asymmetric left ventricular hypertrophy.  At last clinic visit on 03/31/19, the patient reported leg and feet swelling. Advised patient to wear compression stockings and prop legs up when resting. She also reported minimal exercise due to dyspnea on exertion. However, she continued to limit salt and fluid intake. The patient had elevated home readings and elevated BP in clinic at 180/80 and 162/76 on repeat. Discussed proper technique for taking blood pressure and advised her to bring cuff and blood pressure readings to the next appointment. Patient was instructed to increase carvedilol to 12.5 mg BID and increase hydralazine to 50 mg TID.  Patient arrives in HTN clinic for follow-up. She is doing well and denies dizziness, headache, or lightheadedness. However, she still has significant swelling in her legs that worsens at night and says her compression stocks are too tight to wear now. She states she is taking medications as instructed. She checks her blood pressure before taking her morning medications because she goes to work. Home BP readings are elevated and BP in clinic was 140/58 mmHg with HR 66. Reports her nephrologist adjusts her Lasix dose.  Current HTN meds: amlodipine 10 mg once daily, carvedilol 12.5 mg twice a day, furosemide 40 mg twice a day, hydralazine 50 mg TID, Imdur 15 mg once a day Previously tried: atenolol 50 mg (switched to carvedilol), lisinopril 10 mg (elevated Scr), quinapril  10 mg (switched to lisinopril) BP goal: < 130/80 mmHg  Family History: The patient's family history includes Breast cancer in her maternal aunt and maternal grandmother; Breast cancer (age of onset: 58) in her mother; Clotting disorder in her maternal grandmother; Colon cancer in her maternal grandmother; Diabetes in her maternal grandmother; Heart attack in her maternal grandmother; Hypertension in her daughter; Kidney failure in her maternal aunt; Lung cancer in her maternal aunt; Pancreatic cancer (age of onset: 44) in her brother; Prostate cancer in her maternal uncle.  Social History: No alcohol, tobacco, or drug use  Diet: Limited salt and fluid intake. Breakfast- eggs with toast, grits or oatmeal. Lunch- cheese and crackers. Dinner- green beans, rice, and steak. Fluid intake- 2 cups of water in morning, 1 cup of lemonade with lunch, and a cup of water at night.  Exercise: Limited     Home BP readings: The past 2 weeks (checking before taking AM BP meds) - 160/66, 174/83, 110, 154/70, 177/86, 171/72, 180/85, 161/77, 114/185, 165/77, 165/77, 162/79, 154/76, 155/71, 146/64, 173/78, 148/66  Wt Readings from Last 3 Encounters:  04/06/19 191 lb 12.8 oz (87 kg)  03/31/19 189 lb (85.7 kg)  02/03/19 181 lb (82.1 kg)   BP Readings from Last 3 Encounters:  04/06/19 (!) 159/69  03/31/19 (!) 170/67  03/31/19 (!) 162/76   Pulse Readings from Last 3 Encounters:  04/06/19 82  03/31/19 80  03/31/19  74    Renal function: Estimated Creatinine Clearance: 34.9 mL/min (A) (by C-G formula based on SCr of 1.61 mg/dL (H)).  Past Medical History:  Diagnosis Date  . Abdominal discomfort    Chronic N/V/D. Presumptive dx Crohn's dx per elevated p ANCA. Failed Entocort and Pentasa. Sep 2003 - ileocolectomy c anastomosis per Dr Deon Pilling 2/2 adhesions - path was hegative for Crohns. EGD, Sm bowel follow through (11/03), and an eteroclysis (10/03) were unrevealing. Cuases hypomag and hypocalcemia.  .  Adnexal mass 8/03   s/p lap BSO (R ovarian fibroma) & lysis of adhesions  . Allergy    Seasonal  . Anemia    Multifactorial. Baseline HgB 10-11 ish. B12 def - 150 in 3/10. Fe Def - ferritin 35 3/10. Both are being repleted.  . Breast cancer ([place]) 03/16/13   right, 5 o'clock  . CAD (coronary artery disease) 1996   1996 - PTCA and angioplasty diagonal branch. 2000 - Rotoblator & angiopllasty of diagonal. 2006 - subendocardial AMI, DES to proximal LAD.Marland Kitchen Also had 90% stenosis in distal apical LAD. EF 55 with apical hypokinesis. Indefinite ASA and Plavix.  . CHF (congestive heart failure) ([place])   . Chronic kidney disease    Chronic renal insuff baseline Cr 1.2 - 1.4 ish.  . Chronic pain    CT 10/10 = Spinal stenosis L2 - S1.  . Diabetes mellitus    Insulin dependent  . Hx of radiation therapy 06/02/13- 07/16/13   right rbeast 4500 cGy 25 sessions, right breast boost 1600 cGy in 8 sessions  . Hyperlipidemia    Managed with both a statin and Welchol. Welchol stopped 2014 2/2 cost and started on fenofibrate   . Hypertension    2006 B renal arteries patent. 2003 MRA - no RAS. 2003 pheo W/U Dr Hassell Done reportedly negative.  . Hypoxia 07/23/2017  . Lupus ([place])   . Personal history of radiation therapy   . RBBB   . SBO (small bowel obstruction) ([place]) 09/17/2017  . Stroke [facility])    Incidental finding MRI 2002 L lacunar infarct  . Wears dentures    top    Current Outpatient Medications on File Prior to Visit  Medication Sig Dispense Refill  . acetaminophen (TYLENOL) 325 MG tablet Take 650 mg by mouth every 6 (six) hours as needed for mild pain.    Marland Kitchen amLODipine (NORVASC) 10 MG tablet Take 1 tablet (10 mg total) by mouth daily. 30 tablet 0  . budesonide (ENTOCORT EC) 3 MG 24 hr capsule Take 3 capsules (9 mg total) by mouth every morning. 90 capsule 1  . carvedilol (COREG) 12.5 MG tablet Take 1 tablet (12.5 mg total) by mouth 2 (two) times daily. 60 tablet 11  . colestipol (COLESTID) 1 g tablet Take 2  g by mouth at bedtime.    . cyclobenzaprine (FLEXERIL) 5 MG tablet Take 1 tablet (5 mg total) by mouth at bedtime as needed for muscle spasms. 15 tablet 0  . desloratadine (CLARINEX) 5 MG tablet Take 5 mg by mouth at bedtime as needed (allergies). Reported on 04/11/2015    . diphenoxylate-atropine (LOMOTIL) 2.5-0.025 MG tablet Take 1 tablet by mouth 4 (four) times daily as needed for diarrhea or loose stools. 60 tablet 0  . ferrous fumarate (HEMOCYTE - 106 MG FE) 325 (106 Fe) MG TABS tablet Take 1 tablet by mouth as directed.    . furosemide (LASIX) 40 MG tablet Take 1 tablet (40 mg total) by mouth 2 (two) times daily. (  Patient taking differently: Take 120 mg by mouth 2 (two) times daily. Take 2 tabs  in the am and one tab  at noon) 180 tablet 3  . gabapentin (NEURONTIN) 300 MG capsule Take 1 capsule by mouth at bedtime 30 capsule 0  . hydrALAZINE (APRESOLINE) 50 MG tablet Take 1 tablet (50 mg total) by mouth 3 (three) times daily. 180 tablet 3  . hydrOXYzine (ATARAX/VISTARIL) 50 MG tablet Take 50 mg by mouth 2 (two) times daily as needed.    . isosorbide mononitrate (IMDUR) 30 MG 24 hr tablet Take 1/2 (one-half) tablet by mouth once daily 45 tablet 6  . LANTUS SOLOSTAR 100 UNIT/ML Solostar Pen SMARTSIG:14 Unit(s) SUB-Q Every Night    . linagliptin (TRADJENTA) 5 MG TABS tablet Take 5 mg by mouth daily.    . Melatonin 5 MG TABS Take 10 mg by mouth at bedtime.    . methylPREDNISolone (MEDROL DOSEPAK) 4 MG TBPK tablet follow package directions 21 tablet 0  . nitroGLYCERIN (NITROSTAT) 0.4 MG SL tablet DISSOLVE 1 TABLET UNDER THE TONGUE EVERY 5 MINUTES AS  NEEDED FOR CHEST PAIN. MAX  OF 3 TABLETS IN 15 MINUTES. CALL 911 IF PAIN PERSISTS. 100 tablet 3  . pregabalin (LYRICA) 75 MG capsule Take 1 capsule (75 mg total) by mouth 2 (two) times daily. 60 capsule 0  . ULORIC 40 MG tablet Take 1 tablet by mouth daily.     No current facility-administered medications on file prior to visit.    Allergies    Allergen Reactions  . Percocet [Oxycodone-Acetaminophen] Other (See Comments)    headaches  . Diazepam Other (See Comments)    REACTION: Agitation  . Haloperidol Lactate Nausea And Vomiting  . Lorazepam Nausea Only  . Morphine Sulfate Other (See Comments)    REACTION: Agitation  . Propoxyphene Hcl Nausea And Vomiting  . Tramadol Hcl Nausea And Vomiting  . Anesthetics, Halogenated     Per patient's daughter, she can not tolerate anesthetics.   . Etomidate     Heart stopped during anesthesia  . Fentanyl     Heart stopped during anesthesia   . Versed [Midazolam]     Heart stopped during anesthesia     Assessment/Plan:  1. Hypertension - Blood pressure has improved but remains above goal of <130/80 mmHg. Will increase hydralazine to 100 mg TID and continue amlodipine 10 mg daily, carvedilol 12.5 mg BID, furosemide 40 mg BID, and Imdur 15 mg daily. Advised patient to reach out to nephrologist to manage diuretic dosing. Encouraged her to prop feet up at night and buy new size of compression stockings to decrease swelling. Advised patient to start checking her blood pressure in the evening after relaxed from work and continue to limit sodium and fluid intake. Will follow-up in clinic in 4 weeks.  Julieta Bellini, PharmD Candidate  Megan E. Supple, PharmD, BCACP, [place] [address] [place], [place] [address] Phone: [phone]; Fax: [phone] 04/16/2019 11:42 AM

## 2019-04-16 ENCOUNTER — Ambulatory Visit (INDEPENDENT_AMBULATORY_CARE_PROVIDER_SITE_OTHER): Payer: Medicare Other | Admitting: Pharmacist

## 2019-04-16 ENCOUNTER — Other Ambulatory Visit: Payer: Self-pay

## 2019-04-16 VITALS — BP 140/58 | HR 66

## 2019-04-16 DIAGNOSIS — I10 Essential (primary) hypertension: Secondary | ICD-10-CM

## 2019-04-16 MED ORDER — HYDRALAZINE HCL 100 MG PO TABS: 100.0000 mg | ORAL_TABLET | Freq: Three times a day (TID) | ORAL | 11 refills | Status: DC

## 2019-04-16 NOTE — Patient Instructions (Addendum)
It was nice seeing you today!!   INCREASE hydralazine to 100 mg three times daily.  Continue taking  amlodipine 10 mg once daily, carvedilol 12.5 mg twice a day, furosemide 40 mg twice a day, and Imdur 15 mg once a day.  Continue to limit salt and fluid intake.  Please buy new compression socks that are not too tight so that you can wear them more often.  Prop your legs up at night to help with swelling.  We will follow-up with you in clinic in 4 weeks.  Please call [phone] if you have any questions.

## 2019-04-17 ENCOUNTER — Ambulatory Visit
Admission: RE | Admit: 2019-04-17 | Discharge: 2019-04-17 | Disposition: A | Discharge status: Home / Self Care | Payer: Medicare Other | Source: Ambulatory Visit | Attending: Hematology | Admitting: Hematology

## 2019-04-17 DIAGNOSIS — C50311 Malignant neoplasm of lower-inner quadrant of right female breast: Secondary | ICD-10-CM

## 2019-04-17 DIAGNOSIS — Z170 Estrogen receptor positive status [ER+]: Secondary | ICD-10-CM

## 2019-04-19 ENCOUNTER — Ambulatory Visit: Payer: Medicare Other

## 2019-04-20 ENCOUNTER — Inpatient Hospital Stay: Payer: Medicare Other

## 2019-04-20 ENCOUNTER — Inpatient Hospital Stay: Payer: Medicare Other | Attending: Internal Medicine

## 2019-04-20 ENCOUNTER — Other Ambulatory Visit: Payer: Self-pay

## 2019-04-20 VITALS — BP 142/68 | HR 68 | Temp 98.2°F | Resp 18

## 2019-04-20 DIAGNOSIS — E538 Deficiency of other specified B group vitamins: Secondary | ICD-10-CM | POA: Diagnosis not present

## 2019-04-20 DIAGNOSIS — D509 Iron deficiency anemia, unspecified: Secondary | ICD-10-CM

## 2019-04-20 DIAGNOSIS — D631 Anemia in chronic kidney disease: Secondary | ICD-10-CM | POA: Diagnosis present

## 2019-04-20 DIAGNOSIS — Z853 Personal history of malignant neoplasm of breast: Secondary | ICD-10-CM

## 2019-04-20 DIAGNOSIS — N184 Chronic kidney disease, stage 4 (severe): Secondary | ICD-10-CM | POA: Insufficient documentation

## 2019-04-20 LAB — CBC WITH DIFFERENTIAL/PLATELET
Abs Immature Granulocytes: 0.05 10*3/uL (ref 0.00–0.07)
Basophils Absolute: 0 10*3/uL (ref 0.0–0.1)
Basophils Relative: 0 %
Eosinophils Absolute: 0 10*3/uL (ref 0.0–0.5)
Eosinophils Relative: 0 %
HCT: 33 % — ABNORMAL LOW (ref 36.0–46.0)
Hemoglobin: 10.3 g/dL — ABNORMAL LOW (ref 12.0–15.0)
Immature Granulocytes: 1 %
Lymphocytes Relative: 18 %
Lymphs Abs: 1.5 10*3/uL (ref 0.7–4.0)
MCH: 27.3 pg (ref 26.0–34.0)
MCHC: 31.2 g/dL (ref 30.0–36.0)
MCV: 87.5 fL (ref 80.0–100.0)
Monocytes Absolute: 0.5 10*3/uL (ref 0.1–1.0)
Monocytes Relative: 6 %
Neutro Abs: 6.1 10*3/uL (ref 1.7–7.7)
Neutrophils Relative %: 75 %
Platelets: 272 10*3/uL (ref 150–400)
RBC: 3.77 MIL/uL — ABNORMAL LOW (ref 3.87–5.11)
RDW: 14.6 % (ref 11.5–15.5)
WBC: 8.2 10*3/uL (ref 4.0–10.5)
nRBC: 0 % (ref 0.0–0.2)

## 2019-04-20 LAB — VITAMIN B12: Vitamin B-12: 192 pg/mL (ref 180–914)

## 2019-04-20 MED ORDER — EPOETIN ALFA-EPBX 10000 UNIT/ML IJ SOLN
10000.0000 [IU] | Freq: Once | INTRAMUSCULAR | Status: AC
  Administered 2019-04-20: 10000 [IU] via SUBCUTANEOUS

## 2019-04-20 MED ORDER — EPOETIN ALFA-EPBX 10000 UNIT/ML IJ SOLN
INTRAMUSCULAR | Status: AC
  Filled 2019-04-20: qty 1

## 2019-04-20 NOTE — Patient Instructions (Signed)

## 2019-04-23 ENCOUNTER — Telehealth: Payer: Self-pay

## 2019-04-23 NOTE — Telephone Encounter (Signed)
TC to pt per Cira Rue NP to let her know that her B12 is in low range and Lacie recommends monthly B12 injection. I asked her if she is getting this somewhere else like PCP. She stated that she is not. And she also stated that she is willing to start getting them here at the cancer center monthly. Lacie made aware.

## 2019-04-23 NOTE — Telephone Encounter (Signed)
Left voicemail for patient to call back [facility] 

## 2019-04-24 ENCOUNTER — Ambulatory Visit: Payer: Medicare Other

## 2019-04-25 ENCOUNTER — Ambulatory Visit: Payer: Medicare Other

## 2019-04-27 ENCOUNTER — Other Ambulatory Visit: Payer: Self-pay | Admitting: Nurse Practitioner

## 2019-04-27 DIAGNOSIS — D631 Anemia in chronic kidney disease: Secondary | ICD-10-CM

## 2019-04-27 DIAGNOSIS — N184 Chronic kidney disease, stage 4 (severe): Secondary | ICD-10-CM

## 2019-04-28 ENCOUNTER — Encounter: Payer: Medicare Other | Attending: Physical Medicine & Rehabilitation | Admitting: Physical Medicine & Rehabilitation

## 2019-04-28 ENCOUNTER — Encounter: Payer: Self-pay | Admitting: Physical Medicine & Rehabilitation

## 2019-04-28 ENCOUNTER — Other Ambulatory Visit: Payer: Self-pay

## 2019-04-28 VITALS — BP 179/84 | HR 79 | Temp 97.6°F | Ht 65.0 in | Wt 186.0 lb

## 2019-04-28 DIAGNOSIS — M48062 Spinal stenosis, lumbar region with neurogenic claudication: Secondary | ICD-10-CM | POA: Diagnosis not present

## 2019-04-28 DIAGNOSIS — M5417 Radiculopathy, lumbosacral region: Secondary | ICD-10-CM | POA: Insufficient documentation

## 2019-04-28 MED ORDER — PREGABALIN 100 MG PO CAPS: 100.0000 mg | ORAL_CAPSULE | Freq: Two times a day (BID) | ORAL | 1 refills | Status: DC

## 2019-04-28 NOTE — Progress Notes (Signed)
Subjective:    Patient ID: [name], female    DOB: August 21, 1945, 74 y.o.   MRN: [id number]  HPI 74 year old female with history of back pain as well as right lower extremity pain.  She had a 2-week relief after right L4 selective nerve root block.  Her pain has gone back to her usual levels around 7 out of 10 down the right lower extremity.  She has numbness and tingling and burning pain.  She does not have any weakness in the leg.  She is able to walk.  She is now off the gabapentin.  She has been on the pregabalin 75 mg twice daily which has not produced any side effects but has not noted much improvements. Reviewed renal status GFR is 30-40 range.  Pain Inventory Average Pain 7 Pain Right Now 6 My pain is constant and sharp  In the last 24 hours, has pain interfered with the following? General activity 6 Relation with others 0 Enjoyment of life 5 What TIME of day is your pain at its worst? evening, night Sleep (in general) Fair  Pain is worse with: walking, bending, sitting, inactivity, standing and some activites Pain improves with: medication Relief from Meds: 5  Mobility walk without assistance ability to climb steps?  no do you drive?  yes  Function employed # of hrs/week 4o  Neuro/Psych bladder control problems spasms  Prior Studies Any changes since last visit?  no  Physicians involved in your care Any changes since last visit?  no   Family History  Problem Relation Age of Onset  . Pancreatic cancer Brother 65  . Breast cancer Mother 67  . Lung cancer Maternal Aunt   . Breast cancer Maternal Aunt   . Prostate cancer Maternal Uncle   . Heart attack Maternal Grandmother   . Breast cancer Maternal Grandmother   . Colon cancer Maternal Grandmother   . Clotting disorder Maternal Grandmother   . Diabetes Maternal Grandmother   . Kidney failure Maternal Aunt   . Hypertension Daughter    Social History   Socioeconomic History  . Marital status:  Legally Separated    Spouse name: Not on file  . Number of children: 5  . Years of education: Not on file  . Highest education level: Not on file  Occupational History  . Occupation: retired    Fish farm manager: [employer]  Tobacco Use  . Smoking status: Never Smoker  . Smokeless tobacco: Never Used  Substance and Sexual Activity  . Alcohol use: No    Alcohol/week: 0.0 standard drinks  . Drug use: No  . Sexual activity: Not on file  Other Topics Concern  . Not on file  Social History Narrative   She is separated 2 sons 3 daughters   She has grandchildren and great-grandchildren   She is retired   Never smoker no tobacco now no drugs no alcohol no caffeine   Social Determinants of Radio broadcast assistant Strain:   . Difficulty of Paying Living Expenses: Not on file  Food Insecurity:   . Worried About Charity fundraiser in the Last Year: Not on file  . Ran Out of Food in the Last Year: Not on file  Transportation Needs:   . Lack of Transportation (Medical): Not on file  . Lack of Transportation (Non-Medical): Not on file  Physical Activity:   . Days of Exercise per Week: Not on file  . Minutes of Exercise per Session: Not on  file  Stress:   . Feeling of Stress : Not on file  Social Connections:   . Frequency of Communication with Friends and Family: Not on file  . Frequency of Social Gatherings with Friends and Family: Not on file  . Attends Religious Services: Not on file  . Active Member of Clubs or Organizations: Not on file  . Attends Archivist Meetings: Not on file  . Marital Status: Not on file   Past Surgical History:  Procedure Laterality Date  . ABDOMINAL HYSTERECTOMY    . BILATERAL SALPINGOOPHORECTOMY  8/03   Lap BSO (R ovarian fibroma) and adhesion lysis  . BOWEL RESECTION  2003   ileocolectomy with anastomosis 2/2 adhesions  . BREAST BIOPSY Right 2015  . BREAST BIOPSY Right 07/2014  . BREAST LUMPECTOMY Right 04/22/2013  . BREAST  LUMPECTOMY WITH NEEDLE LOCALIZATION AND AXILLARY SENTINEL LYMPH NODE BX Right 04/22/2013   Procedure: BREAST LUMPECTOMY WITH NEEDLE LOCALIZATION AND AXILLARY SENTINEL LYMPH NODE BX;  Surgeon: Stark Klein, MD;  Location: [facility];  Service: General;  Laterality: Right;  . CARDIAC CATHETERIZATION     2 stents  . CHOLECYSTECTOMY    . COLONOSCOPY    . HEMICOLECTOMY     R sided hemicolectomy  . HERNIA REPAIR     Ventral hernia repair  . PTCA  4/06   Past Medical History:  Diagnosis Date  . Abdominal discomfort    Chronic N/V/D. Presumptive dx Crohn's dx per elevated p ANCA. Failed Entocort and Pentasa. Sep 2003 - ileocolectomy c anastomosis per Dr Deon Pilling 2/2 adhesions - path was hegative for Crohns. EGD, Sm bowel follow through (11/03), and an eteroclysis (10/03) were unrevealing. Cuases hypomag and hypocalcemia.  . Adnexal mass 8/03   s/p lap BSO (R ovarian fibroma) & lysis of adhesions  . Allergy    Seasonal  . Anemia    Multifactorial. Baseline HgB 10-11 ish. B12 def - 150 in 3/10. Fe Def - ferritin 35 3/10. Both are being repleted.  . Breast cancer ([place]) 03/16/13   right, 5 o'clock  . CAD (coronary artery disease) 1996   1996 - PTCA and angioplasty diagonal branch. 2000 - Rotoblator & angiopllasty of diagonal. 2006 - subendocardial AMI, DES to proximal LAD.Marland Kitchen Also had 90% stenosis in distal apical LAD. EF 55 with apical hypokinesis. Indefinite ASA and Plavix.  . CHF (congestive heart failure) ([place])   . Chronic kidney disease    Chronic renal insuff baseline Cr 1.2 - 1.4 ish.  . Chronic pain    CT 10/10 = Spinal stenosis L2 - S1.  . Diabetes mellitus    Insulin dependent  . Hx of radiation therapy 06/02/13- 07/16/13   right rbeast 4500 cGy 25 sessions, right breast boost 1600 cGy in 8 sessions  . Hyperlipidemia    Managed with both a statin and Welchol. Welchol stopped 2014 2/2 cost and started on fenofibrate   . Hypertension    2006 B renal arteries patent. 2003 MRA  - no RAS. 2003 pheo W/U Dr Hassell Done reportedly negative.  . Hypoxia 07/23/2017  . Lupus ([place])   . Personal history of radiation therapy 2015  . RBBB   . SBO (small bowel obstruction) ([place]) 09/17/2017  . Stroke [facility])    Incidental finding MRI 2002 L lacunar infarct  . Wears dentures    top   BP (!) 179/84   Pulse 79   Temp 97.6 F (36.4 C)   Ht 5' 5"  (1.651 m)  Wt 186 lb (84.4 kg)   BMI 30.95 kg/m   Opioid Risk Score:   Fall Risk Score:  `1  Depression screen PHQ 2/9  Depression screen [facility] 2/9 07/23/2017 04/18/2017 12/20/2016 10/16/2016 10/11/2016  Decreased Interest 0 0 0 0 0  Down, Depressed, Hopeless 0 0 0 0 0  PHQ - 2 Score 0 0 0 0 0  Some recent data might be hidden    Review of Systems  Constitutional: Negative.   HENT: Negative.   Eyes: Negative.   Respiratory: Negative.   Cardiovascular: Negative.   Gastrointestinal: Negative.   Endocrine: Negative.   Genitourinary: Positive for urgency.  Musculoskeletal: Positive for arthralgias and back pain.       Spasms  Skin: Negative.   Allergic/Immunologic: Negative.   Neurological: Negative.   Hematological: Negative.   Psychiatric/Behavioral: Negative.   All other systems reviewed and are negative.      Objective:   Physical Exam Vitals and nursing note reviewed.  Constitutional:      Appearance: Normal appearance.  Eyes:     Extraocular Movements: Extraocular movements intact.     Conjunctiva/sclera: Conjunctivae normal.     Pupils: Pupils are equal, round, and reactive to light.  Neurological:     Mental Status: She is alert and oriented to person, place, and time.  Psychiatric:        Mood and Affect: Mood normal.        Behavior: Behavior normal.    Speech without dysarthria.  Remainder of exam is deferred secondary to WebEx       Assessment & Plan:  #1.  Lumbar spinal stenosis we discussed the pathophysiology of this condition.  We discussed treatment of the nerve root pain which is at the L4 level.   We discussed potential for worsening including involvement of motor fibers causing weakness.  Currently does not have any of the symptoms.  We discussed elective surgery for decompression.  She does not wish to pursue this option. She does not have any new symptomatology on the left side. Will increase pregabalin to 100 mg twice daily and titrate upward from there WebEx visit in 1 month with nurse practitioner may need to increase pregabalin to 150 mg twice a day Which given her renal status may be her maximum dose.

## 2019-04-30 ENCOUNTER — Telehealth: Payer: Self-pay | Admitting: Physical Medicine & Rehabilitation

## 2019-04-30 DIAGNOSIS — M48062 Spinal stenosis, lumbar region with neurogenic claudication: Secondary | ICD-10-CM

## 2019-04-30 NOTE — Telephone Encounter (Signed)
As discussed during the visit, if she experiences weakness in her lower limb, I would refer to neurosurgery for possible evaluation for decompression.  Please ask if she would like me to make a referral for this and if she has a preference for surgeon

## 2019-04-30 NOTE — Telephone Encounter (Signed)
Request from patient

## 2019-04-30 NOTE — Telephone Encounter (Signed)
Patient had a web ex visit with Dr. Letta Pate on 04/28/19 and she has noticed her legs are giving away.  She would like to know what to do.  Please call patient.

## 2019-05-01 NOTE — Telephone Encounter (Signed)
Contacted patient and LVM asking her to call back and inform if she would like Dr. Letta Pate to proceed with referral.

## 2019-05-01 NOTE — Telephone Encounter (Signed)
Patient agrees with the plan.  She has no preference for surgeon. [place] is her only preference

## 2019-05-03 ENCOUNTER — Other Ambulatory Visit: Payer: Self-pay | Admitting: Internal Medicine

## 2019-05-04 ENCOUNTER — Other Ambulatory Visit: Payer: Self-pay

## 2019-05-04 ENCOUNTER — Inpatient Hospital Stay: Payer: Medicare Other

## 2019-05-04 VITALS — BP 166/71 | HR 77 | Temp 99.1°F | Resp 20

## 2019-05-04 DIAGNOSIS — D631 Anemia in chronic kidney disease: Secondary | ICD-10-CM

## 2019-05-04 DIAGNOSIS — N184 Chronic kidney disease, stage 4 (severe): Secondary | ICD-10-CM

## 2019-05-04 DIAGNOSIS — D509 Iron deficiency anemia, unspecified: Secondary | ICD-10-CM

## 2019-05-04 DIAGNOSIS — Z853 Personal history of malignant neoplasm of breast: Secondary | ICD-10-CM

## 2019-05-04 LAB — CBC WITH DIFFERENTIAL/PLATELET
Abs Immature Granulocytes: 0.07 10*3/uL (ref 0.00–0.07)
Basophils Absolute: 0 10*3/uL (ref 0.0–0.1)
Basophils Relative: 0 %
Eosinophils Absolute: 0.1 10*3/uL (ref 0.0–0.5)
Eosinophils Relative: 1 %
HCT: 32.7 % — ABNORMAL LOW (ref 36.0–46.0)
Hemoglobin: 10.3 g/dL — ABNORMAL LOW (ref 12.0–15.0)
Immature Granulocytes: 1 %
Lymphocytes Relative: 19 %
Lymphs Abs: 1.7 10*3/uL (ref 0.7–4.0)
MCH: 27.1 pg (ref 26.0–34.0)
MCHC: 31.5 g/dL (ref 30.0–36.0)
MCV: 86.1 fL (ref 80.0–100.0)
Monocytes Absolute: 0.6 10*3/uL (ref 0.1–1.0)
Monocytes Relative: 7 %
Neutro Abs: 6.3 10*3/uL (ref 1.7–7.7)
Neutrophils Relative %: 72 %
Platelets: 250 10*3/uL (ref 150–400)
RBC: 3.8 MIL/uL — ABNORMAL LOW (ref 3.87–5.11)
RDW: 14.3 % (ref 11.5–15.5)
WBC: 8.7 10*3/uL (ref 4.0–10.5)
nRBC: 0 % (ref 0.0–0.2)

## 2019-05-04 LAB — VITAMIN B12: Vitamin B-12: 183 pg/mL (ref 180–914)

## 2019-05-04 MED ORDER — CYANOCOBALAMIN 1000 MCG/ML IJ SOLN
INTRAMUSCULAR | Status: AC
  Filled 2019-05-04: qty 1

## 2019-05-04 MED ORDER — CYANOCOBALAMIN 1000 MCG/ML IJ SOLN
1000.0000 ug | Freq: Once | INTRAMUSCULAR | Status: AC
  Administered 2019-05-04: 1000 ug via INTRAMUSCULAR

## 2019-05-04 MED ORDER — EPOETIN ALFA-EPBX 10000 UNIT/ML IJ SOLN
10000.0000 [IU] | Freq: Once | INTRAMUSCULAR | Status: AC
  Administered 2019-05-04: 10000 [IU] via SUBCUTANEOUS

## 2019-05-04 MED ORDER — EPOETIN ALFA-EPBX 10000 UNIT/ML IJ SOLN
INTRAMUSCULAR | Status: AC
  Filled 2019-05-04: qty 1

## 2019-05-04 NOTE — Telephone Encounter (Signed)
Appointment made for 05-18-2019 with Zella Ball.

## 2019-05-04 NOTE — Patient Instructions (Signed)
Epoetin Alfa injection What is this medicine? EPOETIN ALFA (e POE e tin AL fa) helps your body make more red blood cells. This medicine is used to treat anemia caused by chronic kidney disease, cancer chemotherapy, or HIV-therapy. It may also be used before surgery if you have anemia. This medicine may be used for other purposes; ask your health care provider or pharmacist if you have questions. COMMON BRAND NAME(S): Epogen, Procrit, Retacrit What should I tell my health care provider before I take this medicine? They need to know if you have any of these conditions:  cancer  heart disease  high blood pressure  history of blood clots  history of stroke  low levels of folate, iron, or vitamin B12 in the blood  seizures  an unusual or allergic reaction to erythropoietin, albumin, benzyl alcohol, hamster proteins, other medicines, foods, dyes, or preservatives  pregnant or trying to get pregnant  breast-feeding How should I use this medicine? This medicine is for injection into a vein or under the skin. It is usually given by a health care professional in a hospital or clinic setting. If you get this medicine at home, you will be taught how to prepare and give this medicine. Use exactly as directed. Take your medicine at regular intervals. Do not take your medicine more often than directed. It is important that you put your used needles and syringes in a special sharps container. Do not put them in a trash can. If you do not have a sharps container, call your pharmacist or healthcare provider to get one. A special MedGuide will be given to you by the pharmacist with each prescription and refill. Be sure to read this information carefully each time. Talk to your pediatrician regarding the use of this medicine in children. While this drug may be prescribed for selected conditions, precautions do apply. Overdosage: If you think you have taken too much of this medicine contact a poison  control center or emergency room at once. NOTE: This medicine is only for you. Do not share this medicine with others. What if I miss a dose? If you miss a dose, take it as soon as you can. If it is almost time for your next dose, take only that dose. Do not take double or extra doses. What may interact with this medicine? Interactions have not been studied. This list may not describe all possible interactions. Give your health care provider a list of all the medicines, herbs, non-prescription drugs, or dietary supplements you use. Also tell them if you smoke, drink alcohol, or use illegal drugs. Some items may interact with your medicine. What should I watch for while using this medicine? Your condition will be monitored carefully while you are receiving this medicine. You may need blood work done while you are taking this medicine. This medicine may cause a decrease in vitamin B6. You should make sure that you get enough vitamin B6 while you are taking this medicine. Discuss the foods you eat and the vitamins you take with your health care professional. What side effects may I notice from receiving this medicine? Side effects that you should report to your doctor or health care professional as soon as possible:  allergic reactions like skin rash, itching or hives, swelling of the face, lips, or tongue  seizures  signs and symptoms of a blood clot such as breathing problems; changes in vision; chest pain; severe, sudden headache; pain, swelling, warmth in the leg; trouble speaking; sudden numbness or  weakness of the face, arm or leg  signs and symptoms of a stroke like changes in vision; confusion; trouble speaking or understanding; severe headaches; sudden numbness or weakness of the face, arm or leg; trouble walking; dizziness; loss of balance or coordination Side effects that usually do not require medical attention (report to your doctor or health care professional if they continue or are  bothersome):  chills  cough  dizziness  fever  headaches  joint pain  muscle cramps  muscle pain  nausea, vomiting  pain, redness, or irritation at site where injected This list may not describe all possible side effects. Call your doctor for medical advice about side effects. You may report side effects to FDA at 1-800-FDA-1088. Where should I keep my medicine? Keep out of the reach of children. Store in a refrigerator between 2 and 8 degrees C (36 and 46 degrees F). Do not freeze or shake. Throw away any unused portion if using a single-dose vial. Multi-dose vials can be kept in the refrigerator for up to 21 days after the initial dose. Throw away unused medicine. NOTE: This sheet is a summary. It may not cover all possible information. If you have questions about this medicine, talk to your doctor, pharmacist, or health care provider.  2020 Elsevier/Gold Standard (2016-10-12 08:35:19) Cyanocobalamin, Pyridoxine, and Folate What is this medicine? A multivitamin containing folic acid, vitamin B6, and vitamin B12. This medicine may be used for other purposes; ask your health care provider or pharmacist if you have questions. COMMON BRAND NAME(S): AllanFol RX, AllanTex, Av-Vite FB, B Complex with Folic Acid, ComBgen, FaBB, Folamin, Folastin, [place], [place], [place], [place], [place], [employer], Friendly RX 2.2, [place], [place] 2.2, Foltabs 800, Foltx, Homocysteine Formula, Niva-Fol, NuFol, TL [employer], Virt-Gard, Virt-Vite, Virt-Vite [place], Vita-Respa What should I tell my health care provider before I take this medicine? They need to know if you have any of these conditions:  bleeding or clotting disorder  history of anemia of any type  other chronic health condition  an unusual or allergic reaction to vitamins, other medicines, foods, dyes, or preservatives  pregnant or trying to get pregnant  breast-feeding How should I use this medicine? Take by mouth with a glass  of water. May take with food. Follow the directions on the prescription label. It is usually given once a day. Do not take your medicine more often than directed. Contact your pediatrician regarding the use of this medicine in children. Special care may be needed. Overdosage: If you think you have taken too much of this medicine contact a poison control center or emergency room at once. NOTE: This medicine is only for you. Do not share this medicine with others. What if I miss a dose? If you miss a dose, take it as soon as you can. If it is almost time for your next dose, take only that dose. Do not take double or extra doses. What may interact with this medicine?  levodopa This list may not describe all possible interactions. Give your health care provider a list of all the medicines, herbs, non-prescription drugs, or dietary supplements you use. Also tell them if you smoke, drink alcohol, or use illegal drugs. Some items may interact with your medicine. What should I watch for while using this medicine? See your health care professional for regular checks on your progress. Remember that vitamin supplements do not replace the need for good nutrition from a balanced diet. What side effects may I notice from receiving  this medicine? Side effects that you should report to your doctor or health care professional as soon as possible:  allergic reaction such as skin rash or difficulty breathing  vomiting Side effects that usually do not require medical attention (report to your doctor or health care professional if they continue or are bothersome):  nausea  stomach upset This list may not describe all possible side effects. Call your doctor for medical advice about side effects. You may report side effects to FDA at 1-800-FDA-1088. Where should I keep my medicine? Keep out of the reach of children. Most vitamins should be stored at controlled room temperature. Check your specific product  directions. Protect from heat and moisture. Throw away any unused medicine after the expiration date. NOTE: This sheet is a summary. It may not cover all possible information. If you have questions about this medicine, talk to your doctor, pharmacist, or health care provider.  2020 Elsevier/Gold Standard (2007-04-26 00:59:55)

## 2019-05-07 ENCOUNTER — Telehealth: Payer: Self-pay | Admitting: Emergency Medicine

## 2019-05-07 NOTE — Telephone Encounter (Signed)
-----   Message from Alla Feeling, NP sent at 05/06/2019  4:22 PM EST ----- Please let her know B12 remains in low-normal range, continue injections monthly for now Thanks, Regan Rakers

## 2019-05-07 NOTE — Telephone Encounter (Signed)
Called pt regarding results of B12, no answer.  Left VM with results and NP Lacie's recommendations and call back number.

## 2019-05-14 ENCOUNTER — Ambulatory Visit: Payer: Medicare Other | Admitting: Pharmacist

## 2019-05-14 NOTE — Progress Notes (Deleted)
Patient ID: Charnay [name] Tarry                 DOB: November 24, 1945                      MRN: [id number]     HPI: [name] is a 74 y.o. female referred by Dr. Johnsie Cancel to HTN clinic. PMH is significant for labile HTN, CAD, chronic venous insufficiency, HLD, T2DM, CKD stage 3, hyperparathyroidism, anemia, and hx of breast cancer. CAD with previous rotoblator to the LAD in 2000.Myovue on 11/2006 showed mild apical thinning and was low risk. ECHO on 09/04/18 showed EF 60-65% with strain -19.6 and moderate asymmetric left ventricular hypertrophy.  At last clinic visit on 04/16/19, hydralazine was increased to 117m TID and pt was encouraged to reach out to her nephrologist who manages her diuretic dosing due to [name]. Also encouraged her to prop feet up at night and buy new size of compression stockings to decrease swelling. Advised patient to start checking her blood pressure in the evening after relaxed from work and continue to limit sodium and fluid intake.   Patient arrives in HTN clinic for follow-up. She is doing well and denies dizziness, headache, or lightheadedness. However, she still has significant swelling in her legs that worsens at night and says her compression stocks are too tight to wear now. She states she is taking medications as instructed. Reports her nephrologist adjusts her Lasix dose.  Could try spiro if needed, would need bmet first  Current HTN meds: amlodipine 10 mg once daily, carvedilol 12.5 mg twice a day, furosemide 40 mg twice a day, hydralazine 100 mg TID, Imdur 15 mg once a day Previously tried: atenolol 50 mg (switched to carvedilol), lisinopril 10 mg (elevated Scr), quinapril 10 mg (switched to lisinopril) BP goal: < 130/80 mmHg  Family History: The patient's family history includes Breast cancer in her maternal aunt and maternal grandmother; Breast cancer (age of onset: 463 in her mother; Clotting disorder in her maternal grandmother; Colon cancer in her maternal  grandmother; Diabetes in her maternal grandmother; Heart attack in her maternal grandmother; Hypertension in her daughter; Kidney failure in her maternal aunt; Lung cancer in her maternal aunt; Pancreatic cancer (age of onset: 590 in her brother; Prostate cancer in her maternal uncle.  Social History: No alcohol, tobacco, or drug use  Diet: Limited salt and fluid intake. Breakfast- eggs with toast, grits or oatmeal. Lunch- cheese and crackers. Dinner- green beans, rice, and steak. Fluid intake- 2 cups of water in morning, 1 cup of lemonade with lunch, and a cup of water at night.  Exercise: Limited     Home BP readings: The past 2 weeks (checking before taking AM BP meds) - 160/66, 174/83, 110, 154/70, 177/86, 171/72, 180/85, 161/77, 114/185, 165/77, 165/77, 162/79, 154/76, 155/71, 146/64, 173/78, 148/66  Wt Readings from Last 3 Encounters:  04/28/19 186 lb (84.4 kg)  04/06/19 191 lb 12.8 oz (87 kg)  03/31/19 189 lb (85.7 kg)   BP Readings from Last 3 Encounters:  05/04/19 (!) 166/71  04/28/19 (!) 179/84  04/20/19 (!) 142/68   Pulse Readings from Last 3 Encounters:  05/04/19 77  04/28/19 79  04/20/19 68    Renal function: CrCl cannot be calculated (Patient's most recent lab result is older than the maximum 21 days allowed.).  Past Medical History:  Diagnosis Date  . Abdominal discomfort    Chronic N/V/D. Presumptive dx Crohn's dx per elevated p  ANCA. Failed Entocort and Pentasa. Sep 2003 - ileocolectomy c anastomosis per Dr Deon Pilling 2/2 adhesions - path was hegative for Crohns. EGD, Sm bowel follow through (11/03), and an eteroclysis (10/03) were unrevealing. Cuases hypomag and hypocalcemia.  . Adnexal mass 8/03   s/p lap BSO (R ovarian fibroma) & lysis of adhesions  . Allergy    Seasonal  . Anemia    Multifactorial. Baseline HgB 10-11 ish. B12 def - 150 in 3/10. Fe Def - ferritin 35 3/10. Both are being repleted.  . Breast cancer ([place]) 03/16/13   right, 5 o'clock  . CAD  (coronary artery disease) 1996   1996 - PTCA and angioplasty diagonal branch. 2000 - Rotoblator & angiopllasty of diagonal. 2006 - subendocardial AMI, DES to proximal LAD.Marland Kitchen Also had 90% stenosis in distal apical LAD. EF 55 with apical hypokinesis. Indefinite ASA and Plavix.  . CHF (congestive heart failure) ([place])   . Chronic kidney disease    Chronic renal insuff baseline Cr 1.2 - 1.4 ish.  . Chronic pain    CT 10/10 = Spinal stenosis L2 - S1.  . Diabetes mellitus    Insulin dependent  . Hx of radiation therapy 06/02/13- 07/16/13   right rbeast 4500 cGy 25 sessions, right breast boost 1600 cGy in 8 sessions  . Hyperlipidemia    Managed with both a statin and Welchol. Welchol stopped 2014 2/2 cost and started on fenofibrate   . Hypertension    2006 B renal arteries patent. 2003 MRA - no RAS. 2003 pheo W/U Dr Hassell Done reportedly negative.  . Hypoxia 07/23/2017  . Lupus ([place])   . Personal history of radiation therapy 2015  . RBBB   . SBO (small bowel obstruction) ([place]) 09/17/2017  . Stroke [facility])    Incidental finding MRI 2002 L lacunar infarct  . Wears dentures    top    Current Outpatient Medications on File Prior to Visit  Medication Sig Dispense Refill  . acetaminophen (TYLENOL) 325 MG tablet Take 650 mg by mouth every 6 (six) hours as needed for mild pain.    Marland Kitchen amLODipine (NORVASC) 10 MG tablet Take 1 tablet (10 mg total) by mouth daily. 30 tablet 0  . budesonide (ENTOCORT EC) 3 MG 24 hr capsule TAKE 3 CAPSULES BY MOUTH ONCE DAILY IN THE MORNING 90 capsule 0  . carvedilol (COREG) 12.5 MG tablet Take 1 tablet (12.5 mg total) by mouth 2 (two) times daily. 60 tablet 11  . colestipol (COLESTID) 1 g tablet Take 2 g by mouth at bedtime.    . cyclobenzaprine (FLEXERIL) 5 MG tablet Take 1 tablet (5 mg total) by mouth at bedtime as needed for muscle spasms. 15 tablet 0  . desloratadine (CLARINEX) 5 MG tablet Take 5 mg by mouth at bedtime as needed (allergies). Reported on 04/11/2015    .  diphenoxylate-atropine (LOMOTIL) 2.5-0.025 MG tablet Take 1 tablet by mouth 4 (four) times daily as needed for diarrhea or loose stools. 60 tablet 0  . ferrous fumarate (HEMOCYTE - 106 MG FE) 325 (106 Fe) MG TABS tablet Take 1 tablet by mouth as directed.    . furosemide (LASIX) 40 MG tablet Take 1 tablet (40 mg total) by mouth 2 (two) times daily. (Patient taking differently: Take 120 mg by mouth 2 (two) times daily. Take 2 tabs  in the am and one tab  at noon) 180 tablet 3  . gabapentin (NEURONTIN) 300 MG capsule Take 1 capsule by mouth at bedtime 30 capsule 0  .  hydrALAZINE (APRESOLINE) 100 MG tablet Take 1 tablet (100 mg total) by mouth 3 (three) times daily. 90 tablet 11  . hydrOXYzine (ATARAX/VISTARIL) 50 MG tablet Take 50 mg by mouth 2 (two) times daily as needed.    . isosorbide mononitrate (IMDUR) 30 MG 24 hr tablet Take 1/2 (one-half) tablet by mouth once daily 45 tablet 6  . LANTUS SOLOSTAR 100 UNIT/ML Solostar Pen SMARTSIG:14 Unit(s) SUB-Q Every Night    . linagliptin (TRADJENTA) 5 MG TABS tablet Take 5 mg by mouth daily.    . Melatonin 5 MG TABS Take 10 mg by mouth at bedtime.    . nitroGLYCERIN (NITROSTAT) 0.4 MG SL tablet DISSOLVE 1 TABLET UNDER THE TONGUE EVERY 5 MINUTES AS  NEEDED FOR CHEST PAIN. MAX  OF 3 TABLETS IN 15 MINUTES. CALL 911 IF PAIN PERSISTS. 100 tablet 3  . pregabalin (LYRICA) 100 MG capsule Take 1 capsule (100 mg total) by mouth 2 (two) times daily. 60 capsule 1  . ULORIC 40 MG tablet Take 1 tablet by mouth daily.     No current facility-administered medications on file prior to visit.    Allergies  Allergen Reactions  . Percocet [Oxycodone-Acetaminophen] Other (See Comments)    headaches  . Diazepam Other (See Comments)    REACTION: Agitation  . Haloperidol Lactate Nausea And Vomiting  . Lorazepam Nausea Only  . Morphine Sulfate Other (See Comments)    REACTION: Agitation  . Propoxyphene Hcl Nausea And Vomiting  . Tramadol Hcl Nausea And Vomiting  .  Anesthetics, Halogenated     Per patient's daughter, she can not tolerate anesthetics.   . Etomidate     Heart stopped during anesthesia  . Fentanyl     Heart stopped during anesthesia   . Versed [Midazolam]     Heart stopped during anesthesia     Assessment/Plan:  1. Hypertension - Blood pressure has improved but remains above goal of <130/80 mmHg. Will increase hydralazine to 100 mg TID and continue amlodipine 10 mg daily, carvedilol 12.5 mg BID, furosemide 40 mg BID, and Imdur 15 mg daily. Advised patient to reach out to nephrologist to manage diuretic dosing. Encouraged her to prop feet up at night and buy new size of compression stockings to decrease swelling. Advised patient to start checking her blood pressure in the evening after relaxed from work and continue to limit sodium and fluid intake. Will follow-up in clinic in 4 weeks.   Vala Raffo E. Fread Kottke, PharmD, BCACP, [facility] [address], [place], [place] [address] Phone: [phone]; Fax: [phone] 05/14/2019 7:15 AM

## 2019-05-15 ENCOUNTER — Other Ambulatory Visit: Payer: Medicare Other

## 2019-05-18 ENCOUNTER — Other Ambulatory Visit: Payer: Self-pay

## 2019-05-18 ENCOUNTER — Inpatient Hospital Stay: Payer: Medicare Other | Attending: Internal Medicine

## 2019-05-18 ENCOUNTER — Inpatient Hospital Stay: Payer: Medicare Other

## 2019-05-18 VITALS — BP 156/68 | HR 72 | Temp 98.9°F | Resp 18

## 2019-05-18 DIAGNOSIS — D631 Anemia in chronic kidney disease: Secondary | ICD-10-CM | POA: Insufficient documentation

## 2019-05-18 DIAGNOSIS — D509 Iron deficiency anemia, unspecified: Secondary | ICD-10-CM

## 2019-05-18 DIAGNOSIS — N184 Chronic kidney disease, stage 4 (severe): Secondary | ICD-10-CM | POA: Diagnosis not present

## 2019-05-18 DIAGNOSIS — E538 Deficiency of other specified B group vitamins: Secondary | ICD-10-CM | POA: Diagnosis not present

## 2019-05-18 DIAGNOSIS — Z853 Personal history of malignant neoplasm of breast: Secondary | ICD-10-CM

## 2019-05-18 LAB — CBC WITH DIFFERENTIAL/PLATELET
Abs Immature Granulocytes: 0.04 10*3/uL (ref 0.00–0.07)
Basophils Absolute: 0 10*3/uL (ref 0.0–0.1)
Basophils Relative: 0 %
Eosinophils Absolute: 0 10*3/uL (ref 0.0–0.5)
Eosinophils Relative: 0 %
HCT: 32.9 % — ABNORMAL LOW (ref 36.0–46.0)
Hemoglobin: 10.3 g/dL — ABNORMAL LOW (ref 12.0–15.0)
Immature Granulocytes: 1 %
Lymphocytes Relative: 18 %
Lymphs Abs: 1.4 10*3/uL (ref 0.7–4.0)
MCH: 26.9 pg (ref 26.0–34.0)
MCHC: 31.3 g/dL (ref 30.0–36.0)
MCV: 85.9 fL (ref 80.0–100.0)
Monocytes Absolute: 0.6 10*3/uL (ref 0.1–1.0)
Monocytes Relative: 8 %
Neutro Abs: 5.7 10*3/uL (ref 1.7–7.7)
Neutrophils Relative %: 73 %
Platelets: 243 10*3/uL (ref 150–400)
RBC: 3.83 MIL/uL — ABNORMAL LOW (ref 3.87–5.11)
RDW: 14.6 % (ref 11.5–15.5)
WBC: 7.8 10*3/uL (ref 4.0–10.5)
nRBC: 0 % (ref 0.0–0.2)

## 2019-05-18 MED ORDER — EPOETIN ALFA-EPBX 10000 UNIT/ML IJ SOLN
INTRAMUSCULAR | Status: AC
  Filled 2019-05-18: qty 1

## 2019-05-18 MED ORDER — EPOETIN ALFA-EPBX 10000 UNIT/ML IJ SOLN
10000.0000 [IU] | Freq: Once | INTRAMUSCULAR | Status: AC
  Administered 2019-05-18: 10000 [IU] via SUBCUTANEOUS

## 2019-05-18 NOTE — Patient Instructions (Signed)

## 2019-05-23 ENCOUNTER — Emergency Department (HOSPITAL_COMMUNITY)
Admission: EM | Admit: 2019-05-23 | Discharge: 2019-05-23 | Disposition: A | Discharge status: Home / Self Care | Payer: Medicare Other | Attending: Emergency Medicine | Admitting: Emergency Medicine

## 2019-05-23 ENCOUNTER — Encounter (HOSPITAL_COMMUNITY): Payer: Self-pay

## 2019-05-23 ENCOUNTER — Other Ambulatory Visit: Payer: Self-pay

## 2019-05-23 ENCOUNTER — Emergency Department (HOSPITAL_COMMUNITY): Payer: Medicare Other

## 2019-05-23 DIAGNOSIS — E1122 Type 2 diabetes mellitus with diabetic chronic kidney disease: Secondary | ICD-10-CM | POA: Insufficient documentation

## 2019-05-23 DIAGNOSIS — Z794 Long term (current) use of insulin: Secondary | ICD-10-CM | POA: Insufficient documentation

## 2019-05-23 DIAGNOSIS — N183 Chronic kidney disease, stage 3 unspecified: Secondary | ICD-10-CM | POA: Insufficient documentation

## 2019-05-23 DIAGNOSIS — Z853 Personal history of malignant neoplasm of breast: Secondary | ICD-10-CM | POA: Insufficient documentation

## 2019-05-23 DIAGNOSIS — Z79899 Other long term (current) drug therapy: Secondary | ICD-10-CM | POA: Diagnosis not present

## 2019-05-23 DIAGNOSIS — N289 Disorder of kidney and ureter, unspecified: Secondary | ICD-10-CM | POA: Diagnosis not present

## 2019-05-23 DIAGNOSIS — R0602 Shortness of breath: Secondary | ICD-10-CM | POA: Diagnosis present

## 2019-05-23 DIAGNOSIS — R609 Edema, unspecified: Secondary | ICD-10-CM | POA: Diagnosis not present

## 2019-05-23 DIAGNOSIS — E1140 Type 2 diabetes mellitus with diabetic neuropathy, unspecified: Secondary | ICD-10-CM | POA: Diagnosis not present

## 2019-05-23 DIAGNOSIS — I129 Hypertensive chronic kidney disease with stage 1 through stage 4 chronic kidney disease, or unspecified chronic kidney disease: Secondary | ICD-10-CM | POA: Diagnosis not present

## 2019-05-23 LAB — POCT I-STAT EG7
Acid-base deficit: 2 mmol/L (ref 0.0–2.0)
Bicarbonate: 21.3 mmol/L (ref 20.0–28.0)
Calcium, Ion: 1.08 mmol/L — ABNORMAL LOW (ref 1.15–1.40)
HCT: 31 % — ABNORMAL LOW (ref 36.0–46.0)
Hemoglobin: 10.5 g/dL — ABNORMAL LOW (ref 12.0–15.0)
O2 Saturation: 88 %
Potassium: 4.3 mmol/L (ref 3.5–5.1)
Sodium: 143 mmol/L (ref 135–145)
TCO2: 22 mmol/L (ref 22–32)
pCO2, Ven: 30.1 mmHg — ABNORMAL LOW (ref 44.0–60.0)
pH, Ven: 7.458 — ABNORMAL HIGH (ref 7.250–7.430)
pO2, Ven: 51 mmHg — ABNORMAL HIGH (ref 32.0–45.0)

## 2019-05-23 LAB — COMPREHENSIVE METABOLIC PANEL
ALT: 26 U/L (ref 0–44)
AST: 17 U/L (ref 15–41)
Albumin: 3.2 g/dL — ABNORMAL LOW (ref 3.5–5.0)
Alkaline Phosphatase: 77 U/L (ref 38–126)
Anion gap: 12 (ref 5–15)
BUN: 72 mg/dL — ABNORMAL HIGH (ref 8–23)
CO2: 21 mmol/L — ABNORMAL LOW (ref 22–32)
Calcium: 8.6 mg/dL — ABNORMAL LOW (ref 8.9–10.3)
Chloride: 111 mmol/L (ref 98–111)
Creatinine, Ser: 2.35 mg/dL — ABNORMAL HIGH (ref 0.44–1.00)
GFR calc Af Amer: 23 mL/min — ABNORMAL LOW (ref >60)
GFR calc non Af Amer: 20 mL/min — ABNORMAL LOW (ref >60)
Glucose, Bld: 148 mg/dL — ABNORMAL HIGH (ref 70–99)
Potassium: 4.4 mmol/L (ref 3.5–5.1)
Sodium: 144 mmol/L (ref 135–145)
Total Bilirubin: 0.2 mg/dL — ABNORMAL LOW (ref 0.3–1.2)
Total Protein: 6.7 g/dL (ref 6.5–8.1)

## 2019-05-23 LAB — CBC WITH DIFFERENTIAL/PLATELET
Abs Immature Granulocytes: 0.68 10*3/uL — ABNORMAL HIGH (ref 0.00–0.07)
Basophils Absolute: 0.1 10*3/uL (ref 0.0–0.1)
Basophils Relative: 1 %
Eosinophils Absolute: 0.1 10*3/uL (ref 0.0–0.5)
Eosinophils Relative: 1 %
HCT: 33.3 % — ABNORMAL LOW (ref 36.0–46.0)
Hemoglobin: 10.4 g/dL — ABNORMAL LOW (ref 12.0–15.0)
Immature Granulocytes: 6 %
Lymphocytes Relative: 13 %
Lymphs Abs: 1.6 10*3/uL (ref 0.7–4.0)
MCH: 27.2 pg (ref 26.0–34.0)
MCHC: 31.2 g/dL (ref 30.0–36.0)
MCV: 86.9 fL (ref 80.0–100.0)
Monocytes Absolute: 0.5 10*3/uL (ref 0.1–1.0)
Monocytes Relative: 4 %
Neutro Abs: 8.9 10*3/uL — ABNORMAL HIGH (ref 1.7–7.7)
Neutrophils Relative %: 75 %
Platelets: 292 10*3/uL (ref 150–400)
RBC: 3.83 MIL/uL — ABNORMAL LOW (ref 3.87–5.11)
RDW: 15.3 % (ref 11.5–15.5)
WBC: 11.8 10*3/uL — ABNORMAL HIGH (ref 4.0–10.5)
nRBC: 0 % (ref 0.0–0.2)

## 2019-05-23 LAB — BRAIN NATRIURETIC PEPTIDE: B Natriuretic Peptide: 74.1 pg/mL (ref 0.0–100.0)

## 2019-05-23 LAB — TROPONIN I (HIGH SENSITIVITY): Troponin I (High Sensitivity): 7 ng/L (ref <18)

## 2019-05-23 NOTE — ED Notes (Signed)
Unsuccessful IV attempt. Pt is restricted on the right side.

## 2019-05-23 NOTE — ED Notes (Signed)
Pt ambulated to the RR.

## 2019-05-23 NOTE — ED Notes (Signed)
Patient verbalizes understanding of discharge instructions. Opportunity for questioning and answers were provided. Armband removed by staff, pt discharged from ED.  

## 2019-05-23 NOTE — ED Provider Notes (Signed)
[facility] EMERGENCY DEPARTMENT Provider Note   CSN: [id number] Arrival date & time: 05/23/19  1704     History Chief Complaint  Patient presents with  . Shortness of Breath    CHF exacerbation    [name] is a 74 y.o. female.  HPI She presents for evaluation of shortness of breath and leg swelling.  She states she has gained about 30 pounds in 2 months.  She has been followed by her PCP, and was seen 4 days ago for evaluation of leg swelling.  At that time was noted that her creatinine was 2.4, and hemoglobin 9.9, both drawn, 6 days ago.  Today the patient felt like she was twitching, and having trouble breathing so she went to an urgent care who evaluated her, drew some labs, check chest x-ray, then sent her here for suspected fluid overload.  Her daughter, nurse practitioner, contacted me to report that patient was ill with a GI bug, 3 days ago at that time her Lasix was held.  Patient told her daughter that her "brain was not working right today."  Patient reports that she has some mild chest pain at this time.  She denies fever, chills, focal weakness or paresthesia.  There are no other known modifying factors.    Past Medical History:  Diagnosis Date  . Abdominal discomfort    Chronic N/V/D. Presumptive dx Crohn's dx per elevated p ANCA. Failed Entocort and Pentasa. Sep 2003 - ileocolectomy c anastomosis per Dr Deon Pilling 2/2 adhesions - path was hegative for Crohns. EGD, Sm bowel follow through (11/03), and an eteroclysis (10/03) were unrevealing. Cuases hypomag and hypocalcemia.  . Adnexal mass 8/03   s/p lap BSO (R ovarian fibroma) & lysis of adhesions  . Allergy    Seasonal  . Anemia    Multifactorial. Baseline HgB 10-11 ish. B12 def - 150 in 3/10. Fe Def - ferritin 35 3/10. Both are being repleted.  . Breast cancer ([place]) 03/16/13   right, 5 o'clock  . CAD (coronary artery disease) 1996   1996 - PTCA and angioplasty diagonal branch. 2000 -  Rotoblator & angiopllasty of diagonal. 2006 - subendocardial AMI, DES to proximal LAD.Marland Kitchen Also had 90% stenosis in distal apical LAD. EF 55 with apical hypokinesis. Indefinite ASA and Plavix.  . CHF (congestive heart failure) ([place])   . Chronic kidney disease    Chronic renal insuff baseline Cr 1.2 - 1.4 ish.  . Chronic pain    CT 10/10 = Spinal stenosis L2 - S1.  . Diabetes mellitus    Insulin dependent  . Hx of radiation therapy 06/02/13- 07/16/13   right rbeast 4500 cGy 25 sessions, right breast boost 1600 cGy in 8 sessions  . Hyperlipidemia    Managed with both a statin and Welchol. Welchol stopped 2014 2/2 cost and started on fenofibrate   . Hypertension    2006 B renal arteries patent. 2003 MRA - no RAS. 2003 pheo W/U Dr Hassell Done reportedly negative.  . Hypoxia 07/23/2017  . Lupus ([place])   . Personal history of radiation therapy 2015  . RBBB   . SBO (small bowel obstruction) ([place]) 09/17/2017  . Stroke [facility])    Incidental finding MRI 2002 L lacunar infarct  . Wears dentures    top    Patient Active Problem List   Diagnosis Date Noted  . Iron deficiency anemia 08/19/2018  . Spondylosis of lumbar spine 12/24/2017  . Renal vein thrombosis ([place]) 09/17/2017  . Hoarse  voice quality 09/09/2017  . Low back pain without sciatica 09/09/2017  . Labile blood glucose   . Sleep disturbance   . Leukocytosis   . Essential hypertension   . Urinary frequency   . Type 2 diabetes mellitus with peripheral neuropathy (HCC)   . Benign essential HTN   . Stage 2 chronic kidney disease   . Dysphagia   . Debility   . Encephalopathy   . Pressure injury of skin 08/04/2017  . Pneumonia of left lung due to infectious organism   . Diffuse pulmonary alveolar hemorrhage   . SOB (shortness of breath) 10/11/2016  . Hyperparathyroidism, secondary renal ([place]) 05/02/2016  . Gout 04/11/2015  . Diabetic neuropathy associated with type 2 diabetes mellitus ([place]) 01/06/2015  . SBO (small bowel obstruction) ([place])  09/15/2013  . Breast cancer of lower-inner quadrant of right female breast ([place]) 03/18/2013  . Chronic venous insufficiency 01/06/2013  . Health care maintenance 05/08/2011  . Diabetes mellitus type 2 in obese ([place]) 05/03/2010  . Seasonal allergies 05/03/2010  . Labile hypertension   . CAD (coronary artery disease)   . Abdominal discomfort   . Anemia in chronic kidney disease   . History of stroke without residual deficits   . Hyperlipidemia associated with type 2 diabetes mellitus ([place])   . Chronic pain   . Chronic kidney disease, stage III (moderate)   . Adnexal mass   . Spinal stenosis, lumbar region, with neurogenic claudication 01/03/2009    Past Surgical History:  Procedure Laterality Date  . ABDOMINAL HYSTERECTOMY    . BILATERAL SALPINGOOPHORECTOMY  8/03   Lap BSO (R ovarian fibroma) and adhesion lysis  . BOWEL RESECTION  2003   ileocolectomy with anastomosis 2/2 adhesions  . BREAST BIOPSY Right 2015  . BREAST BIOPSY Right 07/2014  . BREAST LUMPECTOMY Right 04/22/2013  . BREAST LUMPECTOMY WITH NEEDLE LOCALIZATION AND AXILLARY SENTINEL LYMPH NODE BX Right 04/22/2013   Procedure: BREAST LUMPECTOMY WITH NEEDLE LOCALIZATION AND AXILLARY SENTINEL LYMPH NODE BX;  Surgeon: Stark Klein, MD;  Location: [facility];  Service: General;  Laterality: Right;  . CARDIAC CATHETERIZATION     2 stents  . CHOLECYSTECTOMY    . COLONOSCOPY    . HEMICOLECTOMY     R sided hemicolectomy  . HERNIA REPAIR     Ventral hernia repair  . PTCA  4/06     OB History   No obstetric history on file.     Family History  Problem Relation Age of Onset  . Pancreatic cancer Brother 71  . Breast cancer Mother 63  . Lung cancer Maternal Aunt   . Breast cancer Maternal Aunt   . Prostate cancer Maternal Uncle   . Heart attack Maternal Grandmother   . Breast cancer Maternal Grandmother   . Colon cancer Maternal Grandmother   . Clotting disorder Maternal Grandmother   . Diabetes  Maternal Grandmother   . Kidney failure Maternal Aunt   . Hypertension Daughter     Social History   Tobacco Use  . Smoking status: Never Smoker  . Smokeless tobacco: Never Used  Substance Use Topics  . Alcohol use: No    Alcohol/week: 0.0 standard drinks  . Drug use: No    Home Medications Prior to Admission medications   Medication Sig Start Date End Date Taking? Authorizing Provider  acetaminophen (TYLENOL) 325 MG tablet Take 650 mg by mouth every 6 (six) hours as needed for mild pain.    [provider]  amLODipine (NORVASC) 10 MG tablet Take 1 tablet (10 mg total) by mouth daily. 08/22/17   Angiulli, Lavon Paganini, PA-C  budesonide (ENTOCORT EC) 3 MG 24 hr capsule TAKE 3 CAPSULES BY MOUTH ONCE DAILY IN THE MORNING 05/04/19   Gatha Mayer, MD  carvedilol (COREG) 12.5 MG tablet Take 1 tablet (12.5 mg total) by mouth 2 (two) times daily. 03/31/19   Josue Hector, MD  colestipol (COLESTID) 1 g tablet Take 2 g by mouth at bedtime. 03/17/19   [provider]  cyclobenzaprine (FLEXERIL) 5 MG tablet Take 1 tablet (5 mg total) by mouth at bedtime as needed for muscle spasms. 01/05/19   Bayard Hugger, NP  desloratadine (CLARINEX) 5 MG tablet Take 5 mg by mouth at bedtime as needed (allergies). Reported on 04/11/2015 04/10/13   [provider]  diphenoxylate-atropine (LOMOTIL) 2.5-0.025 MG tablet Take 1 tablet by mouth 4 (four) times daily as needed for diarrhea or loose stools. 03/10/19   Gatha Mayer, MD  ferrous fumarate (HEMOCYTE - 106 MG FE) 325 (106 Fe) MG TABS tablet Take 1 tablet by mouth as directed.    [provider]  furosemide (LASIX) 40 MG tablet Take 1 tablet (40 mg total) by mouth 2 (two) times daily. Patient taking differently: Take 120 mg by mouth 2 (two) times daily. Take 2 tabs  in the am and one tab  at noon 03/16/19   Josue Hector, MD  gabapentin (NEURONTIN) 300 MG capsule Take 1 capsule by mouth at bedtime 02/22/19   Kirsteins,  Luanna Salk, MD  hydrALAZINE (APRESOLINE) 100 MG tablet Take 1 tablet (100 mg total) by mouth 3 (three) times daily. 04/16/19   Josue Hector, MD  hydrOXYzine (ATARAX/VISTARIL) 50 MG tablet Take 50 mg by mouth 2 (two) times daily as needed. 03/16/19   [provider]  isosorbide mononitrate (IMDUR) 30 MG 24 hr tablet Take 1/2 (one-half) tablet by mouth once daily 02/13/19   Isaiah Serge, NP  LANTUS SOLOSTAR 100 UNIT/ML Solostar Pen SMARTSIG:14 Unit(s) SUB-Q Every Night 02/26/19   [provider]  linagliptin (TRADJENTA) 5 MG TABS tablet Take 5 mg by mouth daily.    [provider]  Melatonin 5 MG TABS Take 10 mg by mouth at bedtime.    [provider]  nitroGLYCERIN (NITROSTAT) 0.4 MG SL tablet DISSOLVE 1 TABLET UNDER THE TONGUE EVERY 5 MINUTES AS  NEEDED FOR CHEST PAIN. MAX  OF 3 TABLETS IN 15 MINUTES. CALL 911 IF PAIN PERSISTS. 02/02/19   Isaiah Serge, NP  pregabalin (LYRICA) 100 MG capsule Take 1 capsule (100 mg total) by mouth 2 (two) times daily. 04/28/19   Kirsteins, Luanna Salk, MD  ULORIC 40 MG tablet Take 1 tablet by mouth daily. 06/20/16   [provider]    Allergies    Percocet [oxycodone-acetaminophen]; Diazepam; Haloperidol lactate; Lorazepam; Morphine sulfate; Propoxyphene hcl; Tramadol hcl; Anesthetics, halogenated; Etomidate; Fentanyl; and Versed [midazolam]  Review of Systems   Review of Systems  All other systems reviewed and are negative.   Physical Exam Updated Vital Signs BP (!) 178/70   Pulse 88   Temp 98.4 F (36.9 C) (Oral)   Resp 15   Ht 5' 5"  (1.651 m)   Wt 87.5 kg   SpO2 98%   BMI 32.12 kg/m   Physical Exam Vitals and nursing note reviewed.  Constitutional:      General: She is not in acute distress.    Appearance: She  is well-developed. She is obese. She is not ill-appearing, toxic-appearing or diaphoretic.  HENT:     Head: Normocephalic and atraumatic.     Right Ear: External ear normal.     Left Ear:  External ear normal.  Eyes:     Conjunctiva/sclera: Conjunctivae normal.     Pupils: Pupils are equal, round, and reactive to light.  Neck:     Trachea: Phonation normal.  Cardiovascular:     Rate and Rhythm: Normal rate and regular rhythm.     Heart sounds: Normal heart sounds.  Pulmonary:     Effort: Pulmonary effort is normal. No respiratory distress.     Breath sounds: Normal breath sounds. No stridor. No wheezing or rhonchi.  Abdominal:     Palpations: Abdomen is soft.     Tenderness: There is no abdominal tenderness.  Musculoskeletal:        General: Normal range of motion.     Cervical back: Normal range of motion and neck supple.     Right lower leg: Edema present.     Left lower leg: Edema present.     Comments: 3+ lower leg edema bilaterally.  Skin:    General: Skin is warm and dry.  Neurological:     Mental Status: She is alert and oriented to person, place, and time.     Cranial Nerves: No cranial nerve deficit.     Sensory: No sensory deficit.     Motor: No abnormal muscle tone.     Coordination: Coordination normal.  Psychiatric:        Mood and Affect: Mood normal.        Behavior: Behavior normal.        Thought Content: Thought content normal.        Judgment: Judgment normal.     ED Results / Procedures / Treatments   Labs (all labs ordered are listed, but only abnormal results are displayed) Labs Reviewed  COMPREHENSIVE METABOLIC PANEL - Abnormal; Notable for the following components:      Result Value   CO2 21 (*)    Glucose, Bld 148 (*)    BUN 72 (*)    Creatinine, Ser 2.35 (*)    Calcium 8.6 (*)    Albumin 3.2 (*)    Total Bilirubin 0.2 (*)    GFR calc non Af Amer 20 (*)    GFR calc Af Amer 23 (*)    All other components within normal limits  CBC WITH DIFFERENTIAL/PLATELET - Abnormal; Notable for the following components:   WBC 11.8 (*)    RBC 3.83 (*)    Hemoglobin 10.4 (*)    HCT 33.3 (*)    Neutro Abs 8.9 (*)    Abs Immature  Granulocytes 0.68 (*)    All other components within normal limits  POCT I-STAT EG7 - Abnormal; Notable for the following components:   pH, Ven 7.458 (*)    pCO2, Ven 30.1 (*)    pO2, Ven 51.0 (*)    Calcium, Ion 1.08 (*)    HCT 31.0 (*)    Hemoglobin 10.5 (*)    All other components within normal limits  BRAIN NATRIURETIC PEPTIDE  BLOOD GAS, VENOUS  TROPONIN I (HIGH SENSITIVITY)  TROPONIN I (HIGH SENSITIVITY)    EKG None  Radiology DG Chest 2 View  Result Date: 05/23/2019 CLINICAL DATA:  Shortness of breath for several months. Lower extremity edema. EXAM: CHEST - 2 VIEW COMPARISON:  05/23/2019 from [facility] FINDINGS:  Stable mild cardiomegaly. Both lungs are clear. No evidence of pulmonary edema or other infiltrate. No evidence of pleural effusion. Surgical clips again seen in the right axilla. IMPRESSION: Mild cardiomegaly.  No active lung disease. Electronically Signed   By: Marlaine Hind M.D.   On: 05/23/2019 17:52    Procedures Procedures (including critical care time)  Medications Ordered in ED Medications - No data to display  ED Course  I have reviewed the triage vital signs and the nursing notes.  Pertinent labs & imaging results that were available during my care of the patient were reviewed by me and considered in my medical decision making (see chart for details).  Clinical Course as of May 23 2002  Sat May 23, 2019  1931 Normal  Troponin I (High Sensitivity) [EW]  1931 Normal except white count high, hemoglobin low  CBC with Differential(!) [EW]  1931 Normal  Brain natriuretic peptide [EW]  1931 Normal except pH high, PCO2 low, PO2 high, calcium low, hematocrit low, hemoglobin low.  Note that this is a venous gas.   [EW]  1931 Normal except sodium low, leukocyte, BUN high, creatinine high, calcium low, albumin low, total protein low, GFR low  Comprehensive metabolic panel(!) [EW]  1017 Per radiology, no infiltrate or CHF.  DG Chest 2 View [EW]    1959 I discussed all the findings with the patient her daughter to Kia, who is a Designer, jewellery.  She understands importance of follow-up with her doctors for management of blood pressure.  At this time the patient is seeing nephrology, Dr. Hollie Salk and that would be a great place to start for recommendations of treatment and management.  Think he understands that the patient should hold her Lasix for 2 or 3 days, since she appears intravascularly dry at this time.   [EW]    Clinical Course User Index [EW] Daleen Bo, MD   MDM Rules/Calculators/A&P                       Patient Vitals for the past 24 hrs:  BP Temp Temp src Pulse Resp SpO2 Height Weight  05/23/19 1948 (!) 178/70 -- -- 88 15 98 % -- --  05/23/19 1946 -- -- -- 87 14 98 % -- --  05/23/19 1945 (!) 181/88 -- -- -- -- -- -- --  05/23/19 1730 (!) 149/82 -- -- 89 19 96 % -- --  05/23/19 1720 -- -- -- -- -- -- 5' 5"  (1.651 m) 87.5 kg  05/23/19 1719 (!) 168/65 98.4 F (36.9 C) Oral 89 -- 100 % -- --    8:04 PM Reevaluation with update and discussion. After initial assessment and treatment, an updated evaluation reveals no change in clinical status, findings discussed with the patient and all questions were answered. Daleen Bo   Medical Decision Making: Nonspecific symptoms and weight gain, and mild blood pressure elevation.  Chest x-ray and BNP are reassuring.  Doubt pneumonia or congestive heart failure.  She has a history of diastolic cardiac dysfunction but no systolic ejection abnormality.  She apparently had a recent viral process, and appears to be somewhat volume depleted currently with elevated BUN/creatinine fraction, higher than baseline.  Suspect multifactorial peripheral edema.  [name] was evaluated in Emergency Department on 05/23/2019 for the symptoms described in the history of present illness. She was evaluated in the context of the global COVID-19 pandemic, which necessitated consideration that the  patient might be at risk for infection  with the SARS-CoV-2 virus that causes COVID-19. Institutional protocols and algorithms that pertain to the evaluation of patients at risk for COVID-19 are in a state of rapid change based on information released by regulatory bodies including the CDC and federal and state organizations. These policies and algorithms were followed during the patient's care in the ED.    CRITICAL CARE- no Performed by: Daleen Bo   Nursing Notes Reviewed/ Care Coordinated Applicable Imaging Reviewed Interpretation of Laboratory Data incorporated into ED treatment  The patient appears reasonably screened and/or stabilized for discharge and I doubt any other medical condition or other [facility] requiring further screening, evaluation, or treatment in the ED at this time prior to discharge.  Plan: Home Medications-hold Lasix for 2 or 3 days then restart, possibly half dose based on guidance from PCP or nephrology.  Continue routine medications; Home Treatments-low-salt diet; return here if the recommended treatment, does not improve the symptoms; Recommended follow up-nephrology PCP, as needed    Final Clinical Impression(s) / ED Diagnoses Final diagnoses:  Peripheral edema  Renal insufficiency    Rx / DC Orders ED Discharge Orders    None       Daleen Bo, MD 05/23/19 2004

## 2019-05-23 NOTE — Discharge Instructions (Signed)
You're swelling appears to be mostly peripheral, outside of blood vessels.  Your creatinine is somewhat elevated and your BUN is much higher than previously.  This indicates relative dehydration in your blood vessels.  Stay on a low-salt diet and continue all your medicine except Lasix.  You can restart your Lasix, in 2 or 3 days, but may need to keep the dose lower than previously by 50%.  Please confirm this with your nephrologist or primary care doctor, by contacting them on Monday.  Return here, if needed.

## 2019-05-23 NOTE — ED Notes (Signed)
Notified phlebotomy of the need for assistance with blood draw for this pt.

## 2019-05-23 NOTE — ED Triage Notes (Signed)
Pt seen at [facility] for SOB x month.Bilat edema. Pt stopped taking Lasix yesterday per MD, due to kidney function.

## 2019-05-26 ENCOUNTER — Encounter: Payer: Medicare Other | Admitting: Registered Nurse

## 2019-06-01 ENCOUNTER — Inpatient Hospital Stay: Payer: Medicare Other

## 2019-06-01 ENCOUNTER — Other Ambulatory Visit: Payer: Self-pay

## 2019-06-01 VITALS — BP 160/78 | HR 82 | Temp 98.2°F | Resp 16

## 2019-06-01 DIAGNOSIS — Z853 Personal history of malignant neoplasm of breast: Secondary | ICD-10-CM

## 2019-06-01 DIAGNOSIS — N184 Chronic kidney disease, stage 4 (severe): Secondary | ICD-10-CM | POA: Diagnosis not present

## 2019-06-01 DIAGNOSIS — D509 Iron deficiency anemia, unspecified: Secondary | ICD-10-CM

## 2019-06-01 LAB — CBC WITH DIFFERENTIAL/PLATELET
Abs Immature Granulocytes: 0.07 10*3/uL (ref 0.00–0.07)
Basophils Absolute: 0 10*3/uL (ref 0.0–0.1)
Basophils Relative: 0 %
Eosinophils Absolute: 0 10*3/uL (ref 0.0–0.5)
Eosinophils Relative: 0 %
HCT: 26.9 % — ABNORMAL LOW (ref 36.0–46.0)
Hemoglobin: 8.3 g/dL — ABNORMAL LOW (ref 12.0–15.0)
Immature Granulocytes: 1 %
Lymphocytes Relative: 13 %
Lymphs Abs: 1.2 10*3/uL (ref 0.7–4.0)
MCH: 26.9 pg (ref 26.0–34.0)
MCHC: 30.9 g/dL (ref 30.0–36.0)
MCV: 87.3 fL (ref 80.0–100.0)
Monocytes Absolute: 0.6 10*3/uL (ref 0.1–1.0)
Monocytes Relative: 6 %
Neutro Abs: 7.8 10*3/uL — ABNORMAL HIGH (ref 1.7–7.7)
Neutrophils Relative %: 80 %
Platelets: 176 10*3/uL (ref 150–400)
RBC: 3.08 MIL/uL — ABNORMAL LOW (ref 3.87–5.11)
RDW: 16.3 % — ABNORMAL HIGH (ref 11.5–15.5)
WBC: 9.7 10*3/uL (ref 4.0–10.5)
nRBC: 0 % (ref 0.0–0.2)

## 2019-06-01 MED ORDER — EPOETIN ALFA-EPBX 10000 UNIT/ML IJ SOLN
10000.0000 [IU] | Freq: Once | INTRAMUSCULAR | Status: AC
  Administered 2019-06-01: 10000 [IU] via SUBCUTANEOUS

## 2019-06-01 MED ORDER — CYANOCOBALAMIN 1000 MCG/ML IJ SOLN
1000.0000 ug | Freq: Once | INTRAMUSCULAR | Status: AC
  Administered 2019-06-01: 1000 ug via INTRAMUSCULAR

## 2019-06-01 MED ORDER — CYANOCOBALAMIN 1000 MCG/ML IJ SOLN
INTRAMUSCULAR | Status: AC
  Filled 2019-06-01: qty 1

## 2019-06-01 MED ORDER — EPOETIN ALFA-EPBX 10000 UNIT/ML IJ SOLN
INTRAMUSCULAR | Status: AC
  Filled 2019-06-01: qty 1

## 2019-06-01 NOTE — Patient Instructions (Signed)
Epoetin Alfa injection What is this medicine? EPOETIN ALFA (e POE e tin AL fa) helps your body make more red blood cells. This medicine is used to treat anemia caused by chronic kidney disease, cancer chemotherapy, or HIV-therapy. It may also be used before surgery if you have anemia. This medicine may be used for other purposes; ask your health care provider or pharmacist if you have questions. COMMON BRAND NAME(S): Epogen, Procrit, Retacrit What should I tell my health care provider before I take this medicine? They need to know if you have any of these conditions:  cancer  heart disease  high blood pressure  history of blood clots  history of stroke  low levels of folate, iron, or vitamin B12 in the blood  seizures  an unusual or allergic reaction to erythropoietin, albumin, benzyl alcohol, hamster proteins, other medicines, foods, dyes, or preservatives  pregnant or trying to get pregnant  breast-feeding How should I use this medicine? This medicine is for injection into a vein or under the skin. It is usually given by a health care professional in a hospital or clinic setting. If you get this medicine at home, you will be taught how to prepare and give this medicine. Use exactly as directed. Take your medicine at regular intervals. Do not take your medicine more often than directed. It is important that you put your used needles and syringes in a special sharps container. Do not put them in a trash can. If you do not have a sharps container, call your pharmacist or healthcare provider to get one. A special MedGuide will be given to you by the pharmacist with each prescription and refill. Be sure to read this information carefully each time. Talk to your pediatrician regarding the use of this medicine in children. While this drug may be prescribed for selected conditions, precautions do apply. Overdosage: If you think you have taken too much of this medicine contact a poison  control center or emergency room at once. NOTE: This medicine is only for you. Do not share this medicine with others. What if I miss a dose? If you miss a dose, take it as soon as you can. If it is almost time for your next dose, take only that dose. Do not take double or extra doses. What may interact with this medicine? Interactions have not been studied. This list may not describe all possible interactions. Give your health care provider a list of all the medicines, herbs, non-prescription drugs, or dietary supplements you use. Also tell them if you smoke, drink alcohol, or use illegal drugs. Some items may interact with your medicine. What should I watch for while using this medicine? Your condition will be monitored carefully while you are receiving this medicine. You may need blood work done while you are taking this medicine. This medicine may cause a decrease in vitamin B6. You should make sure that you get enough vitamin B6 while you are taking this medicine. Discuss the foods you eat and the vitamins you take with your health care professional. What side effects may I notice from receiving this medicine? Side effects that you should report to your doctor or health care professional as soon as possible:  allergic reactions like skin rash, itching or hives, swelling of the face, lips, or tongue  seizures  signs and symptoms of a blood clot such as breathing problems; changes in vision; chest pain; severe, sudden headache; pain, swelling, warmth in the leg; trouble speaking; sudden numbness or  weakness of the face, arm or leg  signs and symptoms of a stroke like changes in vision; confusion; trouble speaking or understanding; severe headaches; sudden numbness or weakness of the face, arm or leg; trouble walking; dizziness; loss of balance or coordination Side effects that usually do not require medical attention (report to your doctor or health care professional if they continue or are  bothersome):  chills  cough  dizziness  fever  headaches  joint pain  muscle cramps  muscle pain  nausea, vomiting  pain, redness, or irritation at site where injected This list may not describe all possible side effects. Call your doctor for medical advice about side effects. You may report side effects to FDA at 1-800-FDA-1088. Where should I keep my medicine? Keep out of the reach of children. Store in a refrigerator between 2 and 8 degrees C (36 and 46 degrees F). Do not freeze or shake. Throw away any unused portion if using a single-dose vial. Multi-dose vials can be kept in the refrigerator for up to 21 days after the initial dose. Throw away unused medicine. NOTE: This sheet is a summary. It may not cover all possible information. If you have questions about this medicine, talk to your doctor, pharmacist, or health care provider.  2020 Elsevier/Gold Standard (2016-10-12 08:35:19) Cyanocobalamin, Vitamin B12 injection What is this medicine? CYANOCOBALAMIN (sye an oh koe BAL a min) is a man made form of vitamin B12. Vitamin B12 is used in the growth of healthy blood cells, nerve cells, and proteins in the body. It also helps with the metabolism of fats and carbohydrates. This medicine is used to treat people who can not absorb vitamin B12. This medicine may be used for other purposes; ask your health care provider or pharmacist if you have questions. COMMON BRAND NAME(S): B-12 Compliance Kit, B-12 Injection Kit, Cyomin, LA-12, Nutri-Twelve, Physicians EZ Use B-12, Primabalt What should I tell my health care provider before I take this medicine? They need to know if you have any of these conditions:  kidney disease  Leber's disease  megaloblastic anemia  an unusual or allergic reaction to cyanocobalamin, cobalt, other medicines, foods, dyes, or preservatives  pregnant or trying to get pregnant  breast-feeding How should I use this medicine? This medicine is  injected into a muscle or deeply under the skin. It is usually given by a health care professional in a clinic or doctor's office. However, your doctor may teach you how to inject yourself. Follow all instructions. Talk to your pediatrician regarding the use of this medicine in children. Special care may be needed. Overdosage: If you think you have taken too much of this medicine contact a poison control center or emergency room at once. NOTE: This medicine is only for you. Do not share this medicine with others. What if I miss a dose? If you are given your dose at a clinic or doctor's office, call to reschedule your appointment. If you give your own injections and you miss a dose, take it as soon as you can. If it is almost time for your next dose, take only that dose. Do not take double or extra doses. What may interact with this medicine?  colchicine  heavy alcohol intake This list may not describe all possible interactions. Give your health care provider a list of all the medicines, herbs, non-prescription drugs, or dietary supplements you use. Also tell them if you smoke, drink alcohol, or use illegal drugs. Some items may interact with your  medicine. What should I watch for while using this medicine? Visit your doctor or health care professional regularly. You may need blood work done while you are taking this medicine. You may need to follow a special diet. Talk to your doctor. Limit your alcohol intake and avoid smoking to get the best benefit. What side effects may I notice from receiving this medicine? Side effects that you should report to your doctor or health care professional as soon as possible:  allergic reactions like skin rash, itching or hives, swelling of the face, lips, or tongue  blue tint to skin  chest tightness, pain  difficulty breathing, wheezing  dizziness  red, swollen painful area on the leg Side effects that usually do not require medical attention (report  to your doctor or health care professional if they continue or are bothersome):  diarrhea  headache This list may not describe all possible side effects. Call your doctor for medical advice about side effects. You may report side effects to FDA at 1-800-FDA-1088. Where should I keep my medicine? Keep out of the reach of children. Store at room temperature between 15 and 30 degrees C (59 and 85 degrees F). Protect from light. Throw away any unused medicine after the expiration date. NOTE: This sheet is a summary. It may not cover all possible information. If you have questions about this medicine, talk to your doctor, pharmacist, or health care provider.  2020 Elsevier/Gold Standard (2007-06-16 22:10:20)

## 2019-06-11 ENCOUNTER — Telehealth: Payer: Self-pay | Admitting: Cardiovascular Disease

## 2019-06-11 NOTE — Telephone Encounter (Addendum)
Spoke with pt who states she has developed swelling to her thighs by afternoon each day with a 10 pound weight increase over the last 7-14 days.  Complaining of some mild SOB with exertion and elevated B/P.  Pt reports she saw her PCP on 06/09/19 who stated pt also had swelling in her face but he made no changes and will see her again next week.  Pt reports taking medications as prescribed.  Pt is unsure what to do at this point.  Will forward information to Dr Johnsie Cancel and his RN for review.  Pt advised if she develops CP, increased SOB, weakness or slurred speech she should report to ED for further evaluation.  Pt verbalizes understanding and agrees with current plan.

## 2019-06-11 NOTE — Telephone Encounter (Signed)
Called patient. Informed patient to keep her appt next week with PCP. Patient stated Dr. Delfina Redwood did get lab work on her this week. Called Dr. Bernell List office left message for them to call back, so we can find out what lab work was ordered. Informed patient that we would call her back to schedule lab work if we need to.

## 2019-06-11 NOTE — Telephone Encounter (Signed)
Continue to f/u with primary make sure she had BNP and BMET would also check ESR and thyroid

## 2019-06-11 NOTE — Telephone Encounter (Signed)
Pt c/o BP issue: STAT if pt c/o blurred vision, one-sided weakness or slurred speech  1. What are your last 5 BP readings?  06/07/19 182/86  06/09/19 195/89 06/10/19 185/83 06/11/19 186/86  2. Are you having any other symptoms (ex. Dizziness, headache, blurred vision, passed out)? No   3. What is your BP issue?   Pt c/o swelling: STAT is pt has developed SOB within 24 hours  1) How much weight have you gained and in what time span? 10 lbs in 6 days   2) If swelling, where is the swelling located? Feet, Legs, and Ankles   3) Are you currently taking a fluid pill? Yes  4) Are you currently SOB? No  5) Do you have a log of your daily weights (if so, list)? No   6) Have you gained 3 pounds in a day or 5 pounds in a week? Yes  7) Have you traveled recently? No    Ginger from [employer] is calling to report the patient recent BP and weight gain from swelling. She states the patient never complains of any symptoms and still works her full time job with no problems that she is aware of. Ginger states the patients swelling occurs in the afternoon in her legs, feet, and ankles and she has gained 10 lbs in 6 days due to it. The patient's BP after her medication kicks in still varies in the 170's. Ginger states you can call her in regards to any further questions you may have, but if any changes need to be made due to this please reach out to the patient.

## 2019-06-15 ENCOUNTER — Inpatient Hospital Stay: Payer: Medicare Other

## 2019-06-15 ENCOUNTER — Other Ambulatory Visit: Payer: Self-pay

## 2019-06-15 VITALS — BP 158/72 | HR 78 | Temp 98.2°F | Resp 18

## 2019-06-15 DIAGNOSIS — N184 Chronic kidney disease, stage 4 (severe): Secondary | ICD-10-CM | POA: Diagnosis not present

## 2019-06-15 DIAGNOSIS — D509 Iron deficiency anemia, unspecified: Secondary | ICD-10-CM

## 2019-06-15 DIAGNOSIS — Z853 Personal history of malignant neoplasm of breast: Secondary | ICD-10-CM

## 2019-06-15 LAB — CBC WITH DIFFERENTIAL/PLATELET
Abs Immature Granulocytes: 0.09 10*3/uL — ABNORMAL HIGH (ref 0.00–0.07)
Basophils Absolute: 0 10*3/uL (ref 0.0–0.1)
Basophils Relative: 1 %
Eosinophils Absolute: 0.1 10*3/uL (ref 0.0–0.5)
Eosinophils Relative: 1 %
HCT: 27.8 % — ABNORMAL LOW (ref 36.0–46.0)
Hemoglobin: 8.4 g/dL — ABNORMAL LOW (ref 12.0–15.0)
Immature Granulocytes: 1 %
Lymphocytes Relative: 17 %
Lymphs Abs: 1.5 10*3/uL (ref 0.7–4.0)
MCH: 27 pg (ref 26.0–34.0)
MCHC: 30.2 g/dL (ref 30.0–36.0)
MCV: 89.4 fL (ref 80.0–100.0)
Monocytes Absolute: 0.7 10*3/uL (ref 0.1–1.0)
Monocytes Relative: 8 %
Neutro Abs: 6.2 10*3/uL (ref 1.7–7.7)
Neutrophils Relative %: 72 %
Platelets: 221 10*3/uL (ref 150–400)
RBC: 3.11 MIL/uL — ABNORMAL LOW (ref 3.87–5.11)
RDW: 15.8 % — ABNORMAL HIGH (ref 11.5–15.5)
WBC: 8.5 10*3/uL (ref 4.0–10.5)
nRBC: 0 % (ref 0.0–0.2)

## 2019-06-15 MED ORDER — EPOETIN ALFA-EPBX 10000 UNIT/ML IJ SOLN
INTRAMUSCULAR | Status: AC
  Filled 2019-06-15: qty 1

## 2019-06-15 MED ORDER — EPOETIN ALFA-EPBX 10000 UNIT/ML IJ SOLN
10000.0000 [IU] | Freq: Once | INTRAMUSCULAR | Status: AC
  Administered 2019-06-15: 10000 [IU] via SUBCUTANEOUS

## 2019-06-15 NOTE — Patient Instructions (Signed)

## 2019-06-17 NOTE — Telephone Encounter (Signed)
Dr. Lina Sar office did a BNP and BMET. They are faxing over lab work.

## 2019-06-24 ENCOUNTER — Other Ambulatory Visit: Payer: Self-pay | Admitting: Cardiology

## 2019-06-25 MED ORDER — HYDRALAZINE HCL 100 MG PO TABS: 100.0000 mg | ORAL_TABLET | Freq: Three times a day (TID) | ORAL | 1 refills | Status: DC

## 2019-06-25 NOTE — Progress Notes (Signed)
[place]   [phone]) [phone] Fax:(336) [phone]   Clinic Follow up Note   Patient Care Team: Seward Carol, MD as PCP - General (Internal Medicine) Josue Hector, MD as PCP - Cardiology (Cardiology) Thelma Comp, [place] as Consulting Physician (Optometry) Stark Klein, MD as Consulting Physician (General Surgery) Truitt Merle, MD as Consulting Physician (Hematology) Madelon Lips, MD as Consulting Physician (Nephrology)  Date of Service:  06/29/2019  CHIEF COMPLAINT: Followup of rightbreast cancer and anemia of chronic disease  SUMMARY OF ONCOLOGIC HISTORY: Oncology History Overview Note  Cancer Staging Breast cancer of lower-inner quadrant of right female breast [facility]) Staging form: Breast, AJCC 7th Edition - Clinical: Stage IA (T1b, N0, cM0) - Unsigned Staging comments: Staged at breast conference 03/25/13.  - Pathologic stage from 05/12/2013: Stage IA (T1b, N0, cM0) - Signed by Truitt Merle, MD on 06/21/2016     Breast cancer of lower-inner quadrant of right female breast ([place])  02/27/2013 Mammogram   Diagnostic mammogram and ultrasound showed a 6 x 5 mm mass at 5:00 position. Axilla was negative by ultrasound.   03/16/2013 Initial Biopsy   Right breast 5:00 position biopsy showed invasive ductal carcinoma, low-grade   03/16/2013 Receptors her2   ER100% positive, PR 100% positive, HER-2 negative, Ki-67 20%   03/18/2013 Initial Diagnosis   Breast cancer of lower-inner quadrant of right female breast ([place])   05/12/2013 Surgery   Right breast lumpectomy with sentinel lymph node biopsy   05/12/2013 Pathology Results   Right breast lumpectomy showed invasive ductal carcinoma, grade 2, 0.9 cm, 6 sentinel lymph nodes were all negative. Margins were negative.   05/12/2013 Oncotype testing   Oncotype recurrence score 19, intermediate risk, 10 year risk of distant recurrence 12% with tamoxifen for 5 years. Patient declined adjuvant chemotherapy.     06/02/2013 - 07/16/2013 Radiation Therapy   Adjuvant breast radiation with boost 1600Gy   08/06/2013 - 07/2017 Anti-estrogen oral therapy   Arimidex 1 mg daily. Stopped in 07/2017 due to acute illness    03/22/2017 Mammogram   IMPRESSION: No mammographic evidence for malignancy.     CURRENT THERAPY:  -Retacrit injection starting 09/2018, currently q2weeks. Increased to 20K units starting 06/29/19 -IV Feraheme as needed starting 09/15/18  -Oral Ferrous sulfate daily -B12 injection monthly starting 05/04/19. Increased to every 2 weeks on 06/29/19.    INTERVAL HISTORY:  [name] is here for a follow up of right breast cancer and anemia. She was last seen by me 7 months ago and has been seen by NP Lacie in interim. She presents to the clinic alone. She notes she is doing well. She notes her energy has been fair to low. She denies chest pain. She is still able to do all her usual activities. She denies any new changes. In 05/2019 she went to ED for fluid retention, she was not treated but did resolve on its own.    REVIEW OF SYSTEMS:   Constitutional: Denies fevers, chills or abnormal weight loss Eyes: Denies blurriness of vision Ears, nose, mouth, throat, and face: Denies mucositis or sore throat Respiratory: Denies cough, dyspnea or wheezes Cardiovascular: Denies palpitation, chest discomfort or lower extremity swelling Gastrointestinal:  Denies nausea, heartburn or change in bowel habits Skin: Denies abnormal skin rashes Lymphatics: Denies new lymphadenopathy or easy bruising Neurological:Denies numbness, tingling or new weaknesses Behavioral/Psych: Mood is stable, no new changes  All other systems were reviewed with the patient and are negative.  MEDICAL HISTORY:  Past Medical History:  Diagnosis Date  . Abdominal discomfort    Chronic N/V/D. Presumptive dx Crohn's dx per elevated p ANCA. Failed Entocort and Pentasa. Sep 2003 - ileocolectomy c anastomosis per Dr Deon Pilling 2/2  adhesions - path was hegative for Crohns. EGD, Sm bowel follow through (11/03), and an eteroclysis (10/03) were unrevealing. Cuases hypomag and hypocalcemia.  . Adnexal mass 8/03   s/p lap BSO (R ovarian fibroma) & lysis of adhesions  . Allergy    Seasonal  . Anemia    Multifactorial. Baseline HgB 10-11 ish. B12 def - 150 in 3/10. Fe Def - ferritin 35 3/10. Both are being repleted.  . Breast cancer ([place]) 03/16/13   right, 5 o'clock  . CAD (coronary artery disease) 1996   1996 - PTCA and angioplasty diagonal branch. 2000 - Rotoblator & angiopllasty of diagonal. 2006 - subendocardial AMI, DES to proximal LAD.Marland Kitchen Also had 90% stenosis in distal apical LAD. EF 55 with apical hypokinesis. Indefinite ASA and Plavix.  . CHF (congestive heart failure) ([place])   . Chronic kidney disease    Chronic renal insuff baseline Cr 1.2 - 1.4 ish.  . Chronic pain    CT 10/10 = Spinal stenosis L2 - S1.  . Diabetes mellitus    Insulin dependent  . Hx of radiation therapy 06/02/13- 07/16/13   right rbeast 4500 cGy 25 sessions, right breast boost 1600 cGy in 8 sessions  . Hyperlipidemia    Managed with both a statin and Welchol. Welchol stopped 2014 2/2 cost and started on fenofibrate   . Hypertension    2006 B renal arteries patent. 2003 MRA - no RAS. 2003 pheo W/U Dr Hassell Done reportedly negative.  . Hypoxia 07/23/2017  . Lupus ([place])   . Personal history of radiation therapy 2015  . RBBB   . SBO (small bowel obstruction) ([place]) 09/17/2017  . Stroke [facility]    Incidental finding MRI 2002 L lacunar infarct  . Wears dentures    top    SURGICAL HISTORY: Past Surgical History:  Procedure Laterality Date  . ABDOMINAL HYSTERECTOMY    . BILATERAL SALPINGOOPHORECTOMY  8/03   Lap BSO (R ovarian fibroma) and adhesion lysis  . BOWEL RESECTION  2003   ileocolectomy with anastomosis 2/2 adhesions  . BREAST BIOPSY Right 2015  . BREAST BIOPSY Right 07/2014  . BREAST LUMPECTOMY Right 04/22/2013  . BREAST LUMPECTOMY WITH  NEEDLE LOCALIZATION AND AXILLARY SENTINEL LYMPH NODE BX Right 04/22/2013   Procedure: BREAST LUMPECTOMY WITH NEEDLE LOCALIZATION AND AXILLARY SENTINEL LYMPH NODE BX;  Surgeon: Stark Klein, MD;  Location: [place];  Service: General;  Laterality: Right;  . CARDIAC CATHETERIZATION     2 stents  . CHOLECYSTECTOMY    . COLONOSCOPY    . HEMICOLECTOMY     R sided hemicolectomy  . HERNIA REPAIR     Ventral hernia repair  . PTCA  4/06    I have reviewed the social history and family history with the patient and they are unchanged from previous note.  ALLERGIES:  is allergic to percocet [oxycodone-acetaminophen]; diazepam; haloperidol lactate; lorazepam; morphine sulfate; propoxyphene hcl; tramadol hcl; anesthetics, halogenated; etomidate; fentanyl; and versed [midazolam].  MEDICATIONS:  Current Outpatient Medications  Medication Sig Dispense Refill  . acetaminophen (TYLENOL) 325 MG tablet Take 650 mg by mouth every 6 (six) hours as needed for mild pain.    Marland Kitchen amLODipine (NORVASC) 10 MG tablet Take 1 tablet (10 mg total) by mouth daily. 30 tablet 0  . budesonide (ENTOCORT EC)  3 MG 24 hr capsule TAKE 3 CAPSULES BY MOUTH ONCE DAILY IN THE MORNING 90 capsule 0  . carvedilol (COREG) 12.5 MG tablet Take 1 tablet (12.5 mg total) by mouth 2 (two) times daily. 60 tablet 11  . colestipol (COLESTID) 1 g tablet Take 2 g by mouth at bedtime.    Marland Kitchen desloratadine (CLARINEX) 5 MG tablet Take 5 mg by mouth at bedtime as needed (allergies). Reported on 04/11/2015    . ferrous fumarate (HEMOCYTE - 106 MG FE) 325 (106 Fe) MG TABS tablet Take 1 tablet by mouth as directed.    . furosemide (LASIX) 40 MG tablet Take 1 tablet (40 mg total) by mouth 2 (two) times daily. (Patient taking differently: Take 120 mg by mouth 2 (two) times daily. Take 2 tabs  in the am and one tab  at noon) 180 tablet 3  . gabapentin (NEURONTIN) 300 MG capsule Take 1 capsule by mouth at bedtime 30 capsule 0  . hydrALAZINE  (APRESOLINE) 100 MG tablet Take 1 tablet (100 mg total) by mouth 3 (three) times daily. 270 tablet 1  . hydrOXYzine (ATARAX/VISTARIL) 50 MG tablet Take 50 mg by mouth 2 (two) times daily as needed.    . isosorbide mononitrate (IMDUR) 30 MG 24 hr tablet Take 1/2 (one-half) tablet by mouth once daily 45 tablet 6  . LANTUS SOLOSTAR 100 UNIT/ML Solostar Pen SMARTSIG:14 Unit(s) SUB-Q Every Night    . linagliptin (TRADJENTA) 5 MG TABS tablet Take 5 mg by mouth daily.    . Melatonin 5 MG TABS Take 10 mg by mouth at bedtime.    . nitroGLYCERIN (NITROSTAT) 0.4 MG SL tablet DISSOLVE 1 TABLET UNDER THE TONGUE EVERY 5 MINUTES AS  NEEDED FOR CHEST PAIN. MAX  OF 3 TABLETS IN 15 MINUTES. CALL 911 IF PAIN PERSISTS. 100 tablet 3  . pregabalin (LYRICA) 100 MG capsule Take 1 capsule (100 mg total) by mouth 2 (two) times daily. 60 capsule 1  . ULORIC 40 MG tablet Take 1 tablet by mouth daily.     No current facility-administered medications for this visit.   Facility-Administered Medications Ordered in Other Visits  Medication Dose Route Frequency Provider Last Rate Last Admin  . cyanocobalamin ((VITAMIN B-12)) injection 1,000 mcg  1,000 mcg Intramuscular Q14 Days Truitt Merle, MD   1,000 mcg at 06/29/19 1205    PHYSICAL EXAMINATION: ECOG PERFORMANCE STATUS: 1 - Symptomatic but completely ambulatory  Vitals:   06/29/19 1113  BP: (!) 152/60  Pulse: 76  Resp: 20  Temp: 99.1 F (37.3 C)  SpO2: 98%   Filed Weights   06/29/19 1113  Weight: 193 lb 14.4 oz (88 kg)    Due to COVID19 we will limit examination to appearance. Patient had no complaints.  GENERAL:alert, no distress and comfortable SKIN: skin color normal, no rashes or significant lesions EYES: normal, Conjunctiva are pink and non-injected, sclera clear  NEURO: alert & oriented x 3 with fluent speech   LABORATORY DATA:  I have reviewed the data as listed CBC Latest Ref Rng & Units 06/29/2019 06/15/2019 06/01/2019  WBC 4.0 - 10.5 K/uL 11.0(H) 8.5  9.7  Hemoglobin 12.0 - 15.0 g/dL 8.7(L) 8.4(L) 8.3(L)  Hematocrit 36.0 - 46.0 % 28.6(L) 27.8(L) 26.9(L)  Platelets 150 - 400 K/uL 270 221 176     CMP Latest Ref Rng & Units 06/29/2019 05/23/2019 05/23/2019  Glucose 70 - 99 mg/dL 122(H) - 148(H)  BUN 8 - 23 mg/dL 68(H) - 72(H)  Creatinine 0.44 -  1.00 mg/dL 2.55(H) - 2.35(H)  Sodium 135 - 145 mmol/L 143 143 144  Potassium 3.5 - 5.1 mmol/L 4.1 4.3 4.4  Chloride 98 - 111 mmol/L 104 - 111  CO2 22 - 32 mmol/L 31 - 21(L)  Calcium 8.9 - 10.3 mg/dL 8.6(L) - 8.6(L)  Total Protein 6.5 - 8.1 g/dL 7.0 - 6.7  Total Bilirubin 0.3 - 1.2 mg/dL 0.3 - 0.2(L)  Alkaline Phos 38 - 126 U/L 81 - 77  AST 15 - 41 U/L 17 - 17  ALT 0 - 44 U/L 30 - 26      RADIOGRAPHIC STUDIES: I have personally reviewed the radiological images as listed and agreed with the findings in the report. No results found.   ASSESSMENT & PLAN:  [name] is a 74 y.o. female with    1. Anemia of chronic disease (CKD stage IV), Ironand B12deficiency -Her 04/2018 Colonoscopy with Dr Carlean Purl showed no evidence of GI bleedingor source of bleeding. Heranemia is probably related to her CKD, however her anemia is out of proportion of her stage IV CKD related anemia. -Her 09/09/18 bone marrow biopsyresults showed hypercellular marrow, no evidence of MDS or other concerns.Her Cytogenetics is still pending. -Due to low ferritin from her CKD she received one dose iv feraheme on 09/15/18. -Patient receives a B12 shot every month at PCP's office. Started receiving monthly B12 injections in our clinic on 05/04/19. Will monitor B12 level. -Her insurance did not approve Aranesp so she was switched to Retacrit every 2 weeks on 11/14/18. She is tolerating well. Goal is Hg 9-11.  -Continue OTC iron pill ferrous sulfate 1-2 tab dailyfor irondeficiency -Her anemia has been worse lately. Hg at 8.7. She is mildly symptomatic. Iron panel still pending. Will increase Retacrit 20K units every 2  weeks. Will proceed today.   -Will increase B12 to every 2 weeks {06/29/19) due to her low level 2 months ag0. After 2 months will check B12 level.. -F/u in 2 months    2.Breast cancer of the lower inner quadrant of the right breast, invasive ductal carcinoma with DCIS, pT1bN0Mo, stage IA, ER+ and PR +, HER 2 negative -She was diagnosed in 02/2013 and treated with right lumpectomy, adjuvant radiation andAnastrozolefor 4 years until5/2019.Anastrozole was stopped after she developed septic shock from pneumonia and a cardiac arrest,and we decided not restarted due to the risk of cardiac toxicity. -From a breast cancer standpoint she is clinically doing well. Her 03/2019 mammogram was normal. There is no clinical concern for recurrence. -She isover 6years out since diagnosis. Continue breast cancer surveillance, Next mammogram in 03/2020   3. DM, HTN, CAD -managedby PCP and Dr. Johnsie Cancel the cardiologist.  -Currently on Lantis, tradjentra for DM    4. Chronic renal insufficieny, CKD stageIV -She follows up with Dr.Uptonfor herkidney function.  -She knows to avoid nephrotoxins -Cr at 2.55 today (06/29/19). I recommend she f/u with her nephrologist.    5. Low Backpain, from degenerative arthritis and spinal stenosis - f/u with Orthopedics -She is currently getting injections with orthopedist  6. Genetics results negative  7. Bone Health  -DEXA from 04/2018 shows slightly improved normal bone density with lowest T-score of 0.9 at forearm radius. -Her VitD level is low. She will continue 1000u daily.  8. Left Renal Vein Thrombosisin 07/2017 -Likely provoked from acute illnessand prior SBO -She stopped Xarelto in 01/2018 -Her hypercoagulopathy work-up in November 2019 was negative  9. History of pneumoniaand cardiac arrestin 07/2017 -10/16/17 CXR showed residual peribronchial  disease,atelectasis, scarring or potentially pulmonary nodules. -Her latest CT chest from  01/27/18 showsNo acute findings. Scarring in the left upper lobe.   PLAN -Labs reviewed, will proceed with Retacrit today at increased dose 20K units and change B12 injection to every 2 weeks until her level is up -Lab and Retacrit and B12 injections every 2 weeks X4 -Continueoralferrous sulfate daily -f/u in 2 months    No problem-specific Assessment & Plan notes found for this encounter.   No orders of the defined types were placed in this encounter.  All questions were answered. The patient knows to call the clinic with any problems, questions or concerns. No barriers to learning was detected. The total time spent in the appointment was 20 minutes.     Truitt Merle, MD 06/29/2019   I, Joslyn Devon, am acting as scribe for Truitt Merle, MD.   I have reviewed the above documentation for accuracy and completeness, and I agree with the above.

## 2019-06-29 ENCOUNTER — Inpatient Hospital Stay: Payer: Medicare Other

## 2019-06-29 ENCOUNTER — Encounter: Payer: Self-pay | Admitting: Hematology

## 2019-06-29 ENCOUNTER — Other Ambulatory Visit: Payer: Self-pay

## 2019-06-29 ENCOUNTER — Inpatient Hospital Stay: Payer: Medicare Other | Attending: Internal Medicine | Admitting: Hematology

## 2019-06-29 VITALS — BP 152/60 | HR 76 | Temp 99.1°F | Resp 20 | Ht 65.0 in | Wt 193.9 lb

## 2019-06-29 DIAGNOSIS — D631 Anemia in chronic kidney disease: Secondary | ICD-10-CM | POA: Insufficient documentation

## 2019-06-29 DIAGNOSIS — D509 Iron deficiency anemia, unspecified: Secondary | ICD-10-CM

## 2019-06-29 DIAGNOSIS — E538 Deficiency of other specified B group vitamins: Secondary | ICD-10-CM | POA: Diagnosis not present

## 2019-06-29 DIAGNOSIS — Z170 Estrogen receptor positive status [ER+]: Secondary | ICD-10-CM

## 2019-06-29 DIAGNOSIS — C50311 Malignant neoplasm of lower-inner quadrant of right female breast: Secondary | ICD-10-CM

## 2019-06-29 DIAGNOSIS — Z853 Personal history of malignant neoplasm of breast: Secondary | ICD-10-CM

## 2019-06-29 DIAGNOSIS — N184 Chronic kidney disease, stage 4 (severe): Secondary | ICD-10-CM | POA: Insufficient documentation

## 2019-06-29 LAB — CBC WITH DIFFERENTIAL/PLATELET
Abs Immature Granulocytes: 0.12 10*3/uL — ABNORMAL HIGH (ref 0.00–0.07)
Basophils Absolute: 0 10*3/uL (ref 0.0–0.1)
Basophils Relative: 0 %
Eosinophils Absolute: 0 10*3/uL (ref 0.0–0.5)
Eosinophils Relative: 0 %
HCT: 28.6 % — ABNORMAL LOW (ref 36.0–46.0)
Hemoglobin: 8.7 g/dL — ABNORMAL LOW (ref 12.0–15.0)
Immature Granulocytes: 1 %
Lymphocytes Relative: 12 %
Lymphs Abs: 1.3 10*3/uL (ref 0.7–4.0)
MCH: 26.8 pg (ref 26.0–34.0)
MCHC: 30.4 g/dL (ref 30.0–36.0)
MCV: 88 fL (ref 80.0–100.0)
Monocytes Absolute: 0.7 10*3/uL (ref 0.1–1.0)
Monocytes Relative: 6 %
Neutro Abs: 8.9 10*3/uL — ABNORMAL HIGH (ref 1.7–7.7)
Neutrophils Relative %: 81 %
Platelets: 270 10*3/uL (ref 150–400)
RBC: 3.25 MIL/uL — ABNORMAL LOW (ref 3.87–5.11)
RDW: 15.3 % (ref 11.5–15.5)
WBC: 11 10*3/uL — ABNORMAL HIGH (ref 4.0–10.5)
nRBC: 0 % (ref 0.0–0.2)

## 2019-06-29 LAB — CMP (CANCER CENTER ONLY)
ALT: 30 U/L (ref 0–44)
AST: 17 U/L (ref 15–41)
Albumin: 3 g/dL — ABNORMAL LOW (ref 3.5–5.0)
Alkaline Phosphatase: 81 U/L (ref 38–126)
Anion gap: 8 (ref 5–15)
BUN: 68 mg/dL — ABNORMAL HIGH (ref 8–23)
CO2: 31 mmol/L (ref 22–32)
Calcium: 8.6 mg/dL — ABNORMAL LOW (ref 8.9–10.3)
Chloride: 104 mmol/L (ref 98–111)
Creatinine: 2.55 mg/dL — ABNORMAL HIGH (ref 0.44–1.00)
GFR, Est AFR Am: 21 mL/min — ABNORMAL LOW (ref >60)
GFR, Estimated: 18 mL/min — ABNORMAL LOW (ref >60)
Glucose, Bld: 122 mg/dL — ABNORMAL HIGH (ref 70–99)
Potassium: 4.1 mmol/L (ref 3.5–5.1)
Sodium: 143 mmol/L (ref 135–145)
Total Bilirubin: 0.3 mg/dL (ref 0.3–1.2)
Total Protein: 7 g/dL (ref 6.5–8.1)

## 2019-06-29 LAB — IRON AND TIBC
Iron: 50 ug/dL (ref 41–142)
Saturation Ratios: 17 % — ABNORMAL LOW (ref 21–57)
TIBC: 289 ug/dL (ref 236–444)
UIBC: 239 ug/dL (ref 120–384)

## 2019-06-29 LAB — FERRITIN: Ferritin: 90 ng/mL (ref 11–307)

## 2019-06-29 MED ORDER — EPOETIN ALFA-EPBX 10000 UNIT/ML IJ SOLN
20000.0000 [IU] | Freq: Once | INTRAMUSCULAR | Status: AC
  Administered 2019-06-29: 20000 [IU] via SUBCUTANEOUS

## 2019-06-29 MED ORDER — EPOETIN ALFA-EPBX 10000 UNIT/ML IJ SOLN
INTRAMUSCULAR | Status: AC
  Filled 2019-06-29: qty 2

## 2019-06-29 MED ORDER — CYANOCOBALAMIN 1000 MCG/ML IJ SOLN
1000.0000 ug | INTRAMUSCULAR | Status: DC
  Administered 2019-06-29: 12:00:00 1000 ug via INTRAMUSCULAR

## 2019-06-29 NOTE — Patient Instructions (Signed)
Cyanocobalamin, Vitamin B12 injection What is this medicine? CYANOCOBALAMIN (sye an oh koe BAL a min) is a man made form of vitamin B12. Vitamin B12 is used in the growth of healthy blood cells, nerve cells, and proteins in the body. It also helps with the metabolism of fats and carbohydrates. This medicine is used to treat people who can not absorb vitamin B12. This medicine may be used for other purposes; ask your health care provider or pharmacist if you have questions. COMMON BRAND NAME(S): B-12 Compliance Kit, B-12 Injection Kit, Cyomin, LA-12, Nutri-Twelve, Physicians EZ Use B-12, Primabalt What should I tell my health care provider before I take this medicine? They need to know if you have any of these conditions:  kidney disease  Leber's disease  megaloblastic anemia  an unusual or allergic reaction to cyanocobalamin, cobalt, other medicines, foods, dyes, or preservatives  pregnant or trying to get pregnant  breast-feeding How should I use this medicine? This medicine is injected into a muscle or deeply under the skin. It is usually given by a health care professional in a clinic or doctor's office. However, your doctor may teach you how to inject yourself. Follow all instructions. Talk to your pediatrician regarding the use of this medicine in children. Special care may be needed. Overdosage: If you think you have taken too much of this medicine contact a poison control center or emergency room at once. NOTE: This medicine is only for you. Do not share this medicine with others. What if I miss a dose? If you are given your dose at a clinic or doctor's office, call to reschedule your appointment. If you give your own injections and you miss a dose, take it as soon as you can. If it is almost time for your next dose, take only that dose. Do not take double or extra doses. What may interact with this medicine?  colchicine  heavy alcohol intake This list may not describe all  possible interactions. Give your health care provider a list of all the medicines, herbs, non-prescription drugs, or dietary supplements you use. Also tell them if you smoke, drink alcohol, or use illegal drugs. Some items may interact with your medicine. What should I watch for while using this medicine? Visit your doctor or health care professional regularly. You may need blood work done while you are taking this medicine. You may need to follow a special diet. Talk to your doctor. Limit your alcohol intake and avoid smoking to get the best benefit. What side effects may I notice from receiving this medicine? Side effects that you should report to your doctor or health care professional as soon as possible:  allergic reactions like skin rash, itching or hives, swelling of the face, lips, or tongue  blue tint to skin  chest tightness, pain  difficulty breathing, wheezing  dizziness  red, swollen painful area on the leg Side effects that usually do not require medical attention (report to your doctor or health care professional if they continue or are bothersome):  diarrhea  headache This list may not describe all possible side effects. Call your doctor for medical advice about side effects. You may report side effects to FDA at 1-800-FDA-1088. Where should I keep my medicine? Keep out of the reach of children. Store at room temperature between 15 and 30 degrees C (59 and 85 degrees F). Protect from light. Throw away any unused medicine after the expiration date. NOTE: This sheet is a summary. It may not cover  all possible information. If you have questions about this medicine, talk to your doctor, pharmacist, or health care provider.  2020 Elsevier/Gold Standard (2007-06-16 22:10:20) Epoetin Alfa injection What is this medicine? EPOETIN ALFA (e POE e tin AL fa) helps your body make more red blood cells. This medicine is used to treat anemia caused by chronic kidney disease, cancer  chemotherapy, or HIV-therapy. It may also be used before surgery if you have anemia. This medicine may be used for other purposes; ask your health care provider or pharmacist if you have questions. COMMON BRAND NAME(S): Epogen, Procrit, Retacrit What should I tell my health care provider before I take this medicine? They need to know if you have any of these conditions:  cancer  heart disease  high blood pressure  history of blood clots  history of stroke  low levels of folate, iron, or vitamin B12 in the blood  seizures  an unusual or allergic reaction to erythropoietin, albumin, benzyl alcohol, hamster proteins, other medicines, foods, dyes, or preservatives  pregnant or trying to get pregnant  breast-feeding How should I use this medicine? This medicine is for injection into a vein or under the skin. It is usually given by a health care professional in a hospital or clinic setting. If you get this medicine at home, you will be taught how to prepare and give this medicine. Use exactly as directed. Take your medicine at regular intervals. Do not take your medicine more often than directed. It is important that you put your used needles and syringes in a special sharps container. Do not put them in a trash can. If you do not have a sharps container, call your pharmacist or healthcare provider to get one. A special MedGuide will be given to you by the pharmacist with each prescription and refill. Be sure to read this information carefully each time. Talk to your pediatrician regarding the use of this medicine in children. While this drug may be prescribed for selected conditions, precautions do apply. Overdosage: If you think you have taken too much of this medicine contact a poison control center or emergency room at once. NOTE: This medicine is only for you. Do not share this medicine with others. What if I miss a dose? If you miss a dose, take it as soon as you can. If it is  almost time for your next dose, take only that dose. Do not take double or extra doses. What may interact with this medicine? Interactions have not been studied. This list may not describe all possible interactions. Give your health care provider a list of all the medicines, herbs, non-prescription drugs, or dietary supplements you use. Also tell them if you smoke, drink alcohol, or use illegal drugs. Some items may interact with your medicine. What should I watch for while using this medicine? Your condition will be monitored carefully while you are receiving this medicine. You may need blood work done while you are taking this medicine. This medicine may cause a decrease in vitamin B6. You should make sure that you get enough vitamin B6 while you are taking this medicine. Discuss the foods you eat and the vitamins you take with your health care professional. What side effects may I notice from receiving this medicine? Side effects that you should report to your doctor or health care professional as soon as possible:  allergic reactions like skin rash, itching or hives, swelling of the face, lips, or tongue  seizures  signs and symptoms of   a blood clot such as breathing problems; changes in vision; chest pain; severe, sudden headache; pain, swelling, warmth in the leg; trouble speaking; sudden numbness or weakness of the face, arm or leg  signs and symptoms of a stroke like changes in vision; confusion; trouble speaking or understanding; severe headaches; sudden numbness or weakness of the face, arm or leg; trouble walking; dizziness; loss of balance or coordination Side effects that usually do not require medical attention (report to your doctor or health care professional if they continue or are bothersome):  chills  cough  dizziness  fever  headaches  joint pain  muscle cramps  muscle pain  nausea, vomiting  pain, redness, or irritation at site where injected This list may  not describe all possible side effects. Call your doctor for medical advice about side effects. You may report side effects to FDA at 1-800-FDA-1088. Where should I keep my medicine? Keep out of the reach of children. Store in a refrigerator between 2 and 8 degrees C (36 and 46 degrees F). Do not freeze or shake. Throw away any unused portion if using a single-dose vial. Multi-dose vials can be kept in the refrigerator for up to 21 days after the initial dose. Throw away unused medicine. NOTE: This sheet is a summary. It may not cover all possible information. If you have questions about this medicine, talk to your doctor, pharmacist, or health care provider.  2020 Elsevier/Gold Standard (2016-10-12 08:35:19)

## 2019-06-30 ENCOUNTER — Telehealth: Payer: Self-pay | Admitting: Hematology

## 2019-06-30 NOTE — Telephone Encounter (Signed)
Scheduled appt per 4/12 los.  Spoke with pt and she is aware of her scheduled appt dates and time.

## 2019-07-03 NOTE — Telephone Encounter (Signed)
Lab work was received and sent to Dr. Johnsie Cancel.

## 2019-07-13 ENCOUNTER — Other Ambulatory Visit: Payer: Self-pay

## 2019-07-13 ENCOUNTER — Inpatient Hospital Stay: Payer: Medicare Other

## 2019-07-13 ENCOUNTER — Other Ambulatory Visit: Payer: Medicare Other

## 2019-07-13 ENCOUNTER — Encounter: Payer: Self-pay | Admitting: Hematology

## 2019-07-13 ENCOUNTER — Ambulatory Visit: Payer: Medicare Other

## 2019-07-13 VITALS — BP 126/72 | HR 82 | Temp 98.6°F | Resp 18

## 2019-07-13 DIAGNOSIS — N184 Chronic kidney disease, stage 4 (severe): Secondary | ICD-10-CM

## 2019-07-13 DIAGNOSIS — Z853 Personal history of malignant neoplasm of breast: Secondary | ICD-10-CM

## 2019-07-13 DIAGNOSIS — D631 Anemia in chronic kidney disease: Secondary | ICD-10-CM

## 2019-07-13 DIAGNOSIS — D509 Iron deficiency anemia, unspecified: Secondary | ICD-10-CM

## 2019-07-13 LAB — CBC WITH DIFFERENTIAL/PLATELET
Abs Immature Granulocytes: 0.05 10*3/uL (ref 0.00–0.07)
Basophils Absolute: 0 10*3/uL (ref 0.0–0.1)
Basophils Relative: 0 %
Eosinophils Absolute: 0.1 10*3/uL (ref 0.0–0.5)
Eosinophils Relative: 1 %
HCT: 24 % — ABNORMAL LOW (ref 36.0–46.0)
Hemoglobin: 7.2 g/dL — ABNORMAL LOW (ref 12.0–15.0)
Immature Granulocytes: 1 %
Lymphocytes Relative: 15 %
Lymphs Abs: 1.3 10*3/uL (ref 0.7–4.0)
MCH: 25.9 pg — ABNORMAL LOW (ref 26.0–34.0)
MCHC: 30 g/dL (ref 30.0–36.0)
MCV: 86.3 fL (ref 80.0–100.0)
Monocytes Absolute: 0.8 10*3/uL (ref 0.1–1.0)
Monocytes Relative: 9 %
Neutro Abs: 6.6 10*3/uL (ref 1.7–7.7)
Neutrophils Relative %: 74 %
Platelets: 166 10*3/uL (ref 150–400)
RBC: 2.78 MIL/uL — ABNORMAL LOW (ref 3.87–5.11)
RDW: 15.9 % — ABNORMAL HIGH (ref 11.5–15.5)
WBC: 8.9 10*3/uL (ref 4.0–10.5)
nRBC: 0 % (ref 0.0–0.2)

## 2019-07-13 MED ORDER — CYANOCOBALAMIN 1000 MCG/ML IJ SOLN
1000.0000 ug | INTRAMUSCULAR | Status: DC
  Administered 2019-07-13: 1000 ug via INTRAMUSCULAR

## 2019-07-13 MED ORDER — EPOETIN ALFA-EPBX 10000 UNIT/ML IJ SOLN
INTRAMUSCULAR | Status: AC
  Filled 2019-07-13: qty 2

## 2019-07-13 MED ORDER — EPOETIN ALFA-EPBX 10000 UNIT/ML IJ SOLN
20000.0000 [IU] | Freq: Once | INTRAMUSCULAR | Status: AC
  Administered 2019-07-13: 20000 [IU] via SUBCUTANEOUS

## 2019-07-13 NOTE — Patient Instructions (Signed)
Epoetin Alfa injection What is this medicine? EPOETIN ALFA (e POE e tin AL fa) helps your body make more red blood cells. This medicine is used to treat anemia caused by chronic kidney disease, cancer chemotherapy, or HIV-therapy. It may also be used before surgery if you have anemia. This medicine may be used for other purposes; ask your health care provider or pharmacist if you have questions. COMMON BRAND NAME(S): Epogen, Procrit, Retacrit What should I tell my health care provider before I take this medicine? They need to know if you have any of these conditions:  cancer  heart disease  high blood pressure  history of blood clots  history of stroke  low levels of folate, iron, or vitamin B12 in the blood  seizures  an unusual or allergic reaction to erythropoietin, albumin, benzyl alcohol, hamster proteins, other medicines, foods, dyes, or preservatives  pregnant or trying to get pregnant  breast-feeding How should I use this medicine? This medicine is for injection into a vein or under the skin. It is usually given by a health care professional in a hospital or clinic setting. If you get this medicine at home, you will be taught how to prepare and give this medicine. Use exactly as directed. Take your medicine at regular intervals. Do not take your medicine more often than directed. It is important that you put your used needles and syringes in a special sharps container. Do not put them in a trash can. If you do not have a sharps container, call your pharmacist or healthcare provider to get one. A special MedGuide will be given to you by the pharmacist with each prescription and refill. Be sure to read this information carefully each time. Talk to your pediatrician regarding the use of this medicine in children. While this drug may be prescribed for selected conditions, precautions do apply. Overdosage: If you think you have taken too much of this medicine contact a poison  control center or emergency room at once. NOTE: This medicine is only for you. Do not share this medicine with others. What if I miss a dose? If you miss a dose, take it as soon as you can. If it is almost time for your next dose, take only that dose. Do not take double or extra doses. What may interact with this medicine? Interactions have not been studied. This list may not describe all possible interactions. Give your health care provider a list of all the medicines, herbs, non-prescription drugs, or dietary supplements you use. Also tell them if you smoke, drink alcohol, or use illegal drugs. Some items may interact with your medicine. What should I watch for while using this medicine? Your condition will be monitored carefully while you are receiving this medicine. You may need blood work done while you are taking this medicine. This medicine may cause a decrease in vitamin B6. You should make sure that you get enough vitamin B6 while you are taking this medicine. Discuss the foods you eat and the vitamins you take with your health care professional. What side effects may I notice from receiving this medicine? Side effects that you should report to your doctor or health care professional as soon as possible:  allergic reactions like skin rash, itching or hives, swelling of the face, lips, or tongue  seizures  signs and symptoms of a blood clot such as breathing problems; changes in vision; chest pain; severe, sudden headache; pain, swelling, warmth in the leg; trouble speaking; sudden numbness or  weakness of the face, arm or leg  signs and symptoms of a stroke like changes in vision; confusion; trouble speaking or understanding; severe headaches; sudden numbness or weakness of the face, arm or leg; trouble walking; dizziness; loss of balance or coordination Side effects that usually do not require medical attention (report to your doctor or health care professional if they continue or are  bothersome):  chills  cough  dizziness  fever  headaches  joint pain  muscle cramps  muscle pain  nausea, vomiting  pain, redness, or irritation at site where injected This list may not describe all possible side effects. Call your doctor for medical advice about side effects. You may report side effects to FDA at 1-800-FDA-1088. Where should I keep my medicine? Keep out of the reach of children. Store in a refrigerator between 2 and 8 degrees C (36 and 46 degrees F). Do not freeze or shake. Throw away any unused portion if using a single-dose vial. Multi-dose vials can be kept in the refrigerator for up to 21 days after the initial dose. Throw away unused medicine. NOTE: This sheet is a summary. It may not cover all possible information. If you have questions about this medicine, talk to your doctor, pharmacist, or health care provider.  2020 Elsevier/Gold Standard (2016-10-12 08:35:19) Cyanocobalamin, Vitamin B12 injection What is this medicine? CYANOCOBALAMIN (sye an oh koe BAL a min) is a man made form of vitamin B12. Vitamin B12 is used in the growth of healthy blood cells, nerve cells, and proteins in the body. It also helps with the metabolism of fats and carbohydrates. This medicine is used to treat people who can not absorb vitamin B12. This medicine may be used for other purposes; ask your health care provider or pharmacist if you have questions. COMMON BRAND NAME(S): B-12 Compliance Kit, B-12 Injection Kit, Cyomin, LA-12, Nutri-Twelve, Physicians EZ Use B-12, Primabalt What should I tell my health care provider before I take this medicine? They need to know if you have any of these conditions:  kidney disease  Leber's disease  megaloblastic anemia  an unusual or allergic reaction to cyanocobalamin, cobalt, other medicines, foods, dyes, or preservatives  pregnant or trying to get pregnant  breast-feeding How should I use this medicine? This medicine is  injected into a muscle or deeply under the skin. It is usually given by a health care professional in a clinic or doctor's office. However, your doctor may teach you how to inject yourself. Follow all instructions. Talk to your pediatrician regarding the use of this medicine in children. Special care may be needed. Overdosage: If you think you have taken too much of this medicine contact a poison control center or emergency room at once. NOTE: This medicine is only for you. Do not share this medicine with others. What if I miss a dose? If you are given your dose at a clinic or doctor's office, call to reschedule your appointment. If you give your own injections and you miss a dose, take it as soon as you can. If it is almost time for your next dose, take only that dose. Do not take double or extra doses. What may interact with this medicine?  colchicine  heavy alcohol intake This list may not describe all possible interactions. Give your health care provider a list of all the medicines, herbs, non-prescription drugs, or dietary supplements you use. Also tell them if you smoke, drink alcohol, or use illegal drugs. Some items may interact with your  medicine. What should I watch for while using this medicine? Visit your doctor or health care professional regularly. You may need blood work done while you are taking this medicine. You may need to follow a special diet. Talk to your doctor. Limit your alcohol intake and avoid smoking to get the best benefit. What side effects may I notice from receiving this medicine? Side effects that you should report to your doctor or health care professional as soon as possible:  allergic reactions like skin rash, itching or hives, swelling of the face, lips, or tongue  blue tint to skin  chest tightness, pain  difficulty breathing, wheezing  dizziness  red, swollen painful area on the leg Side effects that usually do not require medical attention (report  to your doctor or health care professional if they continue or are bothersome):  diarrhea  headache This list may not describe all possible side effects. Call your doctor for medical advice about side effects. You may report side effects to FDA at 1-800-FDA-1088. Where should I keep my medicine? Keep out of the reach of children. Store at room temperature between 15 and 30 degrees C (59 and 85 degrees F). Protect from light. Throw away any unused medicine after the expiration date. NOTE: This sheet is a summary. It may not cover all possible information. If you have questions about this medicine, talk to your doctor, pharmacist, or health care provider.  2020 Elsevier/Gold Standard (2007-06-16 22:10:20)

## 2019-07-14 ENCOUNTER — Other Ambulatory Visit: Payer: Self-pay

## 2019-07-14 ENCOUNTER — Inpatient Hospital Stay: Payer: Medicare Other

## 2019-07-14 ENCOUNTER — Other Ambulatory Visit: Payer: Self-pay | Admitting: Nurse Practitioner

## 2019-07-14 DIAGNOSIS — D631 Anemia in chronic kidney disease: Secondary | ICD-10-CM

## 2019-07-14 DIAGNOSIS — Z853 Personal history of malignant neoplasm of breast: Secondary | ICD-10-CM

## 2019-07-14 DIAGNOSIS — D464 Refractory anemia, unspecified: Secondary | ICD-10-CM

## 2019-07-14 DIAGNOSIS — N184 Chronic kidney disease, stage 4 (severe): Secondary | ICD-10-CM | POA: Diagnosis not present

## 2019-07-14 LAB — PREPARE RBC (CROSSMATCH)

## 2019-07-14 MED ORDER — SODIUM CHLORIDE 0.9% IV SOLUTION
250.0000 mL | Freq: Once | INTRAVENOUS | Status: AC
  Administered 2019-07-14: 15:00:00 250 mL via INTRAVENOUS
  Filled 2019-07-14: qty 250

## 2019-07-14 NOTE — Patient Instructions (Signed)
Blood Transfusion, Adult, Care After This sheet gives you information about how to care for yourself after your procedure. Your doctor may also give you more specific instructions. If you have problems or questions, contact your doctor. What can I expect after the procedure? After the procedure, it is common to have:  Bruising and soreness at the IV site.  A fever or chills on the day of the procedure. This may be your body's response to the new blood cells received.  A headache. Follow these instructions at home: Insertion site care      Follow instructions from your doctor about how to take care of your insertion site. This is where an IV tube was put into your vein. Make sure you: ? Wash your hands with soap and water before and after you change your bandage (dressing). If you cannot use soap and water, use hand sanitizer. ? Change your bandage as told by your doctor.  Check your insertion site every day for signs of infection. Check for: ? Redness, swelling, or pain. ? Bleeding from the site. ? Warmth. ? Pus or a bad smell. General instructions  Take over-the-counter and prescription medicines only as told by your doctor.  Rest as told by your doctor.  Go back to your normal activities as told by your doctor.  Keep all follow-up visits as told by your doctor. This is important. Contact a doctor if:  You have itching or red, swollen areas of skin (hives).  You feel worried or nervous (anxious).  You feel weak after doing your normal activities.  You have redness, swelling, warmth, or pain around the insertion site.  You have blood coming from the insertion site, and the blood does not stop with pressure.  You have pus or a bad smell coming from the insertion site. Get help right away if:  You have signs of a serious reaction. This may be coming from an allergy or the body's defense system (immune system). Signs include: ? Trouble breathing or shortness of  breath. ? Swelling of the face or feeling warm (flushed). ? Fever or chills. ? Head, chest, or back pain. ? Dark pee (urine) or blood in the pee. ? Widespread rash. ? Fast heartbeat. ? Feeling dizzy or light-headed. You may receive your blood transfusion in an outpatient setting. If so, you will be told whom to contact to report any reactions. These symptoms may be an emergency. Do not wait to see if the symptoms will go away. Get medical help right away. Call your local emergency services (911 in the [place] Do not drive yourself to the hospital. Summary  Bruising and soreness at the IV site are common.  Check your insertion site every day for signs of infection.  Rest as told by your doctor. Go back to your normal activities as told by your doctor.  Get help right away if you have signs of a serious reaction. This information is not intended to replace advice given to you by your health care provider. Make sure you discuss any questions you have with your health care provider. Document Revised: 08/28/2018 Document Reviewed: 08/28/2018 Elsevier Patient Education  [place].

## 2019-07-15 LAB — BPAM RBC
Blood Product Expiration Date: 202105212359
ISSUE DATE / TIME: 202104271452
Unit Type and Rh: 1700

## 2019-07-15 LAB — TYPE AND SCREEN
ABO/RH(D): B NEG
Antibody Screen: NEGATIVE
Unit division: 0

## 2019-07-15 LAB — ABO/RH: ABO/RH(D): B NEG

## 2019-07-19 ENCOUNTER — Telehealth: Payer: Self-pay | Admitting: Hematology

## 2019-07-20 ENCOUNTER — Telehealth: Payer: Self-pay

## 2019-07-20 ENCOUNTER — Other Ambulatory Visit: Payer: Self-pay | Admitting: Nurse Practitioner

## 2019-07-20 DIAGNOSIS — D464 Refractory anemia, unspecified: Secondary | ICD-10-CM

## 2019-07-20 NOTE — Telephone Encounter (Signed)
Clarified on fax sent last week. Fax was sent back on Friday.

## 2019-07-20 NOTE — Progress Notes (Signed)
[place]   [phone] [phone]   Clinic Follow up Note   Patient Care Team: Seward Carol, MD as PCP - General (Internal Medicine) Josue Hector, MD as PCP - Cardiology (Cardiology) Thelma Comp, [place] as Consulting Physician (Optometry) Stark Klein, MD as Consulting Physician (General Surgery) Truitt Merle, MD as Consulting Physician (Hematology) Madelon Lips, MD as Consulting Physician (Nephrology) 07/21/2019  CHIEF COMPLAINT: F/u breast cancer and anemia  SUMMARY OF ONCOLOGIC HISTORY: Oncology History Overview Note  Cancer Staging Breast cancer of lower-inner quadrant of right female breast [facility]) Staging form: Breast, AJCC 7th Edition - Clinical: Stage IA (T1b, N0, cM0) - Unsigned Staging comments: Staged at breast conference 03/25/13.  - Pathologic stage from 05/12/2013: Stage IA (T1b, N0, cM0) - Signed by Truitt Merle, MD on 06/21/2016     Breast cancer of lower-inner quadrant of right female breast ([place])  02/27/2013 Mammogram   Diagnostic mammogram and ultrasound showed a 6 x 5 mm mass at 5:00 position. Axilla was negative by ultrasound.   03/16/2013 Initial Biopsy   Right breast 5:00 position biopsy showed invasive ductal carcinoma, low-grade   03/16/2013 Receptors her2   ER100% positive, PR 100% positive, HER-2 negative, Ki-67 20%   03/18/2013 Initial Diagnosis   Breast cancer of lower-inner quadrant of right female breast ([place])   05/12/2013 Surgery   Right breast lumpectomy with sentinel lymph node biopsy   05/12/2013 Pathology Results   Right breast lumpectomy showed invasive ductal carcinoma, grade 2, 0.9 cm, 6 sentinel lymph nodes were all negative. Margins were negative.   05/12/2013 Oncotype testing   Oncotype recurrence score 19, intermediate risk, 10 year risk of distant recurrence 12% with tamoxifen for 5 years. Patient declined adjuvant chemotherapy.   06/02/2013 - 07/16/2013 Radiation Therapy   Adjuvant breast  radiation with boost 1600Gy   08/06/2013 - 07/2017 Anti-estrogen oral therapy   Arimidex 1 mg daily. Stopped in 07/2017 due to acute illness    03/22/2017 Mammogram   IMPRESSION: No mammographic evidence for malignancy.     CURRENT THERAPY:  Retacrit 20,000 units q2 weeks (dose increased) and B12 inj q2 weeks (frequency increased as of 06/29/19 Oral iron once daily (IV Feraheme PRN, last given 09/15/18)  INTERVAL HISTORY: [name] returns for f/u as scheduled. She received B12 and Retacrit on 4/26, she requested to come sooner because she can't come in next week. She feels the same. She has more fatigue and exertional dyspnea over the last 1.5 months. She remains functional and can complete ADLs and work. Appetite and hydration are adequate. She is on fluid and salt restriction per renal. Denies change in bowel habits. She noticed dark blood mixed in stool once recently. Denies bright blood with wiping or other bleeding. She is on oral iron. Has foul smelling urine starting last week, but denies dysuria, urgency, or frequency. No fever or chills. Denies cough, chest pain, or increased leg edema. Not on lasix lately. Her BP meds were adjusted by PCP, she is tolerating the changes. Denies lightheadedness, dizziness, or headache. Denies new pain. Denies concerns in her breasts. Denies new concerns.    MEDICAL HISTORY:  Past Medical History:  Diagnosis Date  . Abdominal discomfort    Chronic N/V/D. Presumptive dx Crohn's dx per elevated p ANCA. Failed Entocort and Pentasa. Sep 2003 - ileocolectomy c anastomosis per Dr Deon Pilling 2/2 adhesions - path was hegative for Crohns. EGD, Sm bowel follow through (11/03), and an eteroclysis (10/03) were unrevealing. Cuases hypomag and  hypocalcemia.  . Adnexal mass 8/03   s/p lap BSO (R ovarian fibroma) & lysis of adhesions  . Allergy    Seasonal  . Anemia    Multifactorial. Baseline HgB 10-11 ish. B12 def - 150 in 3/10. Fe Def - ferritin 35 3/10. Both are being  repleted.  . Breast cancer ([place]) 03/16/13   right, 5 o'clock  . CAD (coronary artery disease) 1996   1996 - PTCA and angioplasty diagonal branch. 2000 - Rotoblator & angiopllasty of diagonal. 2006 - subendocardial AMI, DES to proximal LAD.Marland Kitchen Also had 90% stenosis in distal apical LAD. EF 55 with apical hypokinesis. Indefinite ASA and Plavix.  . CHF (congestive heart failure) ([place])   . Chronic kidney disease    Chronic renal insuff baseline Cr 1.2 - 1.4 ish.  . Chronic pain    CT 10/10 = Spinal stenosis L2 - S1.  . Diabetes mellitus    Insulin dependent  . Hx of radiation therapy 06/02/13- 07/16/13   right rbeast 4500 cGy 25 sessions, right breast boost 1600 cGy in 8 sessions  . Hyperlipidemia    Managed with both a statin and Welchol. Welchol stopped 2014 2/2 cost and started on fenofibrate   . Hypertension    2006 B renal arteries patent. 2003 MRA - no RAS. 2003 pheo W/U Dr Hassell Done reportedly negative.  . Hypoxia 07/23/2017  . Lupus ([place])   . Personal history of radiation therapy 2015  . RBBB   . SBO (small bowel obstruction) ([place]) 09/17/2017  . Stroke [facility])    Incidental finding MRI 2002 L lacunar infarct  . Wears dentures    top    SURGICAL HISTORY: Past Surgical History:  Procedure Laterality Date  . ABDOMINAL HYSTERECTOMY    . BILATERAL SALPINGOOPHORECTOMY  8/03   Lap BSO (R ovarian fibroma) and adhesion lysis  . BOWEL RESECTION  2003   ileocolectomy with anastomosis 2/2 adhesions  . BREAST BIOPSY Right 2015  . BREAST BIOPSY Right 07/2014  . BREAST LUMPECTOMY Right 04/22/2013  . BREAST LUMPECTOMY WITH NEEDLE LOCALIZATION AND AXILLARY SENTINEL LYMPH NODE BX Right 04/22/2013   Procedure: BREAST LUMPECTOMY WITH NEEDLE LOCALIZATION AND AXILLARY SENTINEL LYMPH NODE BX;  Surgeon: Stark Klein, MD;  Location: [place];  Service: General;  Laterality: Right;  . CARDIAC CATHETERIZATION     2 stents  . CHOLECYSTECTOMY    . COLONOSCOPY    . HEMICOLECTOMY     R sided  hemicolectomy  . HERNIA REPAIR     Ventral hernia repair  . PTCA  4/06    I have reviewed the social history and family history with the patient and they are unchanged from previous note.  ALLERGIES:  is allergic to percocet [oxycodone-acetaminophen]; diazepam; haloperidol lactate; lorazepam; morphine sulfate; propoxyphene hcl; tramadol hcl; anesthetics, halogenated; etomidate; fentanyl; and versed [midazolam].  MEDICATIONS:  Current Outpatient Medications  Medication Sig Dispense Refill  . acetaminophen (TYLENOL) 325 MG tablet Take 650 mg by mouth every 6 (six) hours as needed for mild pain.    Marland Kitchen amLODipine (NORVASC) 10 MG tablet Take 1 tablet (10 mg total) by mouth daily. 30 tablet 0  . budesonide (ENTOCORT EC) 3 MG 24 hr capsule TAKE 3 CAPSULES BY MOUTH ONCE DAILY IN THE MORNING 90 capsule 0  . carvedilol (COREG) 12.5 MG tablet Take 1 tablet (12.5 mg total) by mouth 2 (two) times daily. 60 tablet 11  . colestipol (COLESTID) 1 g tablet Take 2 g by mouth at bedtime.    Marland Kitchen  desloratadine (CLARINEX) 5 MG tablet Take 5 mg by mouth at bedtime as needed (allergies). Reported on 04/11/2015    . ferrous fumarate (HEMOCYTE - 106 MG FE) 325 (106 Fe) MG TABS tablet Take 1 tablet by mouth as directed.    . furosemide (LASIX) 40 MG tablet Take 1 tablet (40 mg total) by mouth 2 (two) times daily. (Patient taking differently: Take 120 mg by mouth 2 (two) times daily. Take 2 tabs  in the am and one tab  at noon) 180 tablet 3  . gabapentin (NEURONTIN) 300 MG capsule Take 1 capsule by mouth at bedtime 30 capsule 0  . hydrALAZINE (APRESOLINE) 100 MG tablet Take 1 tablet (100 mg total) by mouth 3 (three) times daily. 270 tablet 1  . hydrOXYzine (ATARAX/VISTARIL) 50 MG tablet Take 50 mg by mouth 2 (two) times daily as needed.    . isosorbide mononitrate (IMDUR) 30 MG 24 hr tablet Take 1/2 (one-half) tablet by mouth once daily 45 tablet 6  . LANTUS SOLOSTAR 100 UNIT/ML Solostar Pen SMARTSIG:14 Unit(s) SUB-Q Every  Night    . linagliptin (TRADJENTA) 5 MG TABS tablet Take 5 mg by mouth daily.    . Melatonin 5 MG TABS Take 10 mg by mouth at bedtime.    . nitroGLYCERIN (NITROSTAT) 0.4 MG SL tablet DISSOLVE 1 TABLET UNDER THE TONGUE EVERY 5 MINUTES AS  NEEDED FOR CHEST PAIN. MAX  OF 3 TABLETS IN 15 MINUTES. CALL 911 IF PAIN PERSISTS. 100 tablet 3  . ULORIC 40 MG tablet Take 1 tablet by mouth daily.     No current facility-administered medications for this visit.   Facility-Administered Medications Ordered in Other Visits  Medication Dose Route Frequency Provider Last Rate Last Admin  . cyanocobalamin ((VITAMIN B-12)) injection 1,000 mcg  1,000 mcg Intramuscular Q14 Days Truitt Merle, MD   1,000 mcg at 07/21/19 0944    PHYSICAL EXAMINATION: ECOG PERFORMANCE STATUS: 1 - Symptomatic but completely ambulatory  Vitals:   07/21/19 0839  BP: 93/70  Pulse: 77  Resp: 20  Temp: 98.9 F (37.2 C)  SpO2: 99%   Filed Weights   07/21/19 0839  Weight: 208 lb 7 oz (94.5 kg)    GENERAL:alert, no distress and comfortable SKIN: no rash  EYES: sclera clear NECK: without mass LUNGS: clear with normal breathing effort HEART: regular rate & rhythm, mild lower extremity edema ABDOMEN: abdomen soft, non-tender and normal bowel sounds NEURO: alert & oriented x 3 with fluent speech, normal gait Breast exam deferred - no concerns   LABORATORY DATA:  I have reviewed the data as listed CBC Latest Ref Rng & Units 07/21/2019 07/13/2019 06/29/2019  WBC 4.0 - 10.5 K/uL 8.9 8.9 11.0(H)  Hemoglobin 12.0 - 15.0 g/dL 8.3(L) 7.2(L) 8.7(L)  Hematocrit 36.0 - 46.0 % 27.4(L) 24.0(L) 28.6(L)  Platelets 150 - 400 K/uL 178 166 270     CMP Latest Ref Rng & Units 07/21/2019 06/29/2019 05/23/2019  Glucose 70 - 99 mg/dL 152(H) 122(H) -  BUN 8 - 23 mg/dL 33(H) 68(H) -  Creatinine 0.44 - 1.00 mg/dL 1.62(H) 2.55(H) -  Sodium 135 - 145 mmol/L 144 143 143  Potassium 3.5 - 5.1 mmol/L 3.8 4.1 4.3  Chloride 98 - 111 mmol/L 114(H) 104 -  CO2 22  - 32 mmol/L 21(L) 31 -  Calcium 8.9 - 10.3 mg/dL 8.4(L) 8.6(L) -  Total Protein 6.5 - 8.1 g/dL 6.3(L) 7.0 -  Total Bilirubin 0.3 - 1.2 mg/dL 0.3 0.3 -  Alkaline Phos  38 - 126 U/L 92 81 -  AST 15 - 41 U/L 15 17 -  ALT 0 - 44 U/L 31 30 -      RADIOGRAPHIC STUDIES: I have personally reviewed the radiological images as listed and agreed with the findings in the report. No results found.   ASSESSMENT & PLAN: [name] a 74 y.o.femalewith   1. Anemia of chronic disease (CKD), Ironand B12deficiency -04/2018 Colonoscopy with Dr Carlean Purl showed no evidence of GI bleedingor source of bleeding. Continue f/u with GI - 09/09/18 bone marrow biopsyresults, which showed hypercellular marrow, no evidence of MDS or other concerns. -due to low ferritin from CKD she is s/p one dose iv ferahemeon 09/15/18. Continue oral iron tab 1-2 tab daily  -Herinsurancedid not approve Aranesp so she started Retacrit every 2 weekson 11/14/18. She tolerates well. Goal is Hg 9-11. -Her B12 was increased to q2 weeks and retacrit dose increased to 20K units on 4/12 which she continues q2 weeks as well.  -Her anemia has worsened lately and required RBC transfusion on 07/14/19 for Hgb 7.2, she is mildly symptomatic with fatigue and exertional dyspnea.  -[name] appears stable. Her Hgb increased to 8.3 after blood transfusion. Her fatigue and exertional dyspnea are unchanged.  -I ordered additional anemia work up; Her absolute retic count is normal, I would expect it to be elevated in response to retacrit and b12 supplementation.  -To r/o hemolysis I checked LDH which is elevated but nonspecific, bili is normal, and haptoglobin is pending. -TSH is normal.  -B12 increased with more frequent dosing.  -folic acid is pending  -MM panel is pending. Her bone marrow biopsy in 08/2018 showed no evidence of MDS so my suspicion is low. -I have ordered a UA to r/o infection, which can cause worsening anemia in  some cases. UA shows rare bacteria and elevated WBC 6-10 but not strongly convincing for UTI. Even if she has one I doubt this caused her anemia to drop <8. Will await culture.  -She noticed 1 episode of dark red blood in her stool. I have ordered occult blood card for stool testing which she will complete at home and return with her next injection. If positive, will discuss with GI. Her last colonoscopy in 04/2018 was unrevealing  -Her iron levels are lower today, serum iron 29, 11% transferrin saturation, ferritin 28 which is low end of normal. I recommend IV Feraheme weekly x2. Due to her work schedule, we can add this on to her next injections in 3 weeks. She had a dose in 08/2018, I reviewed risk/benefit and potential side effects including allergic reaction such as anaphylaxis. She agrees to proceed.  -If her anemia fails to improve or worsens despite adequate iron/B12/retacrit supplementation, a repeat bone marrow biopsy may be indicated especially if she requires frequent transfusions.  -F/u in 4 weeks to review work up   2. Breast cancer of the lower inner quadrant of the right breast, invasive ductal carcinoma with DCIS, pT1bN0Mo, stage IA, ER+ and PR +, HER 2 negative -Diagnosed in 02/2013 and treated with right lumpectomy, adjuvant radiation andAnastrozolefor 4 years until5/2019.Anastrozole was stopped after she developed septic shock from pneumonia and a cardiac arrest,and it was decided not restarted due to the risk of cardiac toxicity. -mammogram 04/17/19 negative. Breast exam deferred today due to no concerns.  -continue surveillance   3. Low Backpain, from degenerative arthritis and spinal stenosis - f/u with Orthopedics -not discussed today   4. CAD, HTN, DM,  CKD stage IV -Continue to follow up withPCP, Dr. [name](cardiology), and Dr. Hollie Salk (nephrology) -Cr and BG improved lately  5. Genetics results negative  6. Bone Health  -DEXA from 04/2018 shows slightly  improved normal bone density with lowest T-score of 0.9 at forearm radius -on calcium and vitamin D  7. Left Renal Vein Thrombosisin 07/2017 -Likely provoked from acute illnessand prior SBO -She stopped Xarelto in 01/2018 -Her hypercoagulopathy work-up in November 2019 was negative  PLAN: -CBC reviewed, Hgb 8.3, no need for RBC transfusion -Anemia work up pending -UA/cx today, culture is pending  -Stool test given, will return with next injection  -Continue retacrit and B12 q2 weeks, will get injections today (a week early) then again in 3 weeks due to work schedule  -IV Feraheme weekly x2 doses, in 3 and 4 weeks  -F/u in 4 weeks to review work up -I called her daughter Hattie Perch this afternoon for update, per Ms. Pitre request   No problem-specific Assessment & Plan notes found for this encounter.   Orders Placed This Encounter  Procedures  . Urine Culture    Standing Status:   Future    Number of Occurrences:   1    Standing Expiration Date:   07/20/2020  . Urinalysis, Complete w Microscopic    Standing Status:   Future    Number of Occurrences:   1    Standing Expiration Date:   07/20/2020  . Occult blood card to lab, stool   All questions were answered. The patient knows to call the clinic with any problems, questions or concerns. No barriers to learning was detected. Total encounter time is 30 minutes.      Alla Feeling, NP 07/21/19

## 2019-07-20 NOTE — Telephone Encounter (Signed)
Pt pharmacy is calling to confirm Rx Hydralazine was increased from 10 mg BID to 100 mg TID. Please confirm with pts pharmacy ([facility]). Thank you.

## 2019-07-21 ENCOUNTER — Inpatient Hospital Stay: Payer: Medicare Other

## 2019-07-21 ENCOUNTER — Inpatient Hospital Stay: Payer: Medicare Other | Attending: Internal Medicine

## 2019-07-21 ENCOUNTER — Other Ambulatory Visit: Payer: Self-pay

## 2019-07-21 ENCOUNTER — Inpatient Hospital Stay (HOSPITAL_BASED_OUTPATIENT_CLINIC_OR_DEPARTMENT_OTHER): Payer: Medicare Other | Admitting: Nurse Practitioner

## 2019-07-21 ENCOUNTER — Encounter: Payer: Self-pay | Admitting: Nurse Practitioner

## 2019-07-21 VITALS — BP 93/70 | HR 77 | Temp 98.9°F | Resp 20 | Ht 64.0 in | Wt 208.4 lb

## 2019-07-21 DIAGNOSIS — D509 Iron deficiency anemia, unspecified: Secondary | ICD-10-CM

## 2019-07-21 DIAGNOSIS — D631 Anemia in chronic kidney disease: Secondary | ICD-10-CM | POA: Diagnosis present

## 2019-07-21 DIAGNOSIS — Z79899 Other long term (current) drug therapy: Secondary | ICD-10-CM | POA: Insufficient documentation

## 2019-07-21 DIAGNOSIS — D464 Refractory anemia, unspecified: Secondary | ICD-10-CM

## 2019-07-21 DIAGNOSIS — N184 Chronic kidney disease, stage 4 (severe): Secondary | ICD-10-CM | POA: Insufficient documentation

## 2019-07-21 DIAGNOSIS — E538 Deficiency of other specified B group vitamins: Secondary | ICD-10-CM | POA: Diagnosis not present

## 2019-07-21 LAB — CBC WITH DIFFERENTIAL (CANCER CENTER ONLY)
Abs Immature Granulocytes: 0.05 10*3/uL (ref 0.00–0.07)
Basophils Absolute: 0 10*3/uL (ref 0.0–0.1)
Basophils Relative: 0 %
Eosinophils Absolute: 0 10*3/uL (ref 0.0–0.5)
Eosinophils Relative: 1 %
HCT: 27.4 % — ABNORMAL LOW (ref 36.0–46.0)
Hemoglobin: 8.3 g/dL — ABNORMAL LOW (ref 12.0–15.0)
Immature Granulocytes: 1 %
Lymphocytes Relative: 15 %
Lymphs Abs: 1.3 10*3/uL (ref 0.7–4.0)
MCH: 26.1 pg (ref 26.0–34.0)
MCHC: 30.3 g/dL (ref 30.0–36.0)
MCV: 86.2 fL (ref 80.0–100.0)
Monocytes Absolute: 0.6 10*3/uL (ref 0.1–1.0)
Monocytes Relative: 7 %
Neutro Abs: 6.8 10*3/uL (ref 1.7–7.7)
Neutrophils Relative %: 76 %
Platelet Count: 178 10*3/uL (ref 150–400)
RBC: 3.18 MIL/uL — ABNORMAL LOW (ref 3.87–5.11)
RDW: 15.9 % — ABNORMAL HIGH (ref 11.5–15.5)
WBC Count: 8.9 10*3/uL (ref 4.0–10.5)
nRBC: 0 % (ref 0.0–0.2)

## 2019-07-21 LAB — URINALYSIS, COMPLETE (UACMP) WITH MICROSCOPIC
Bilirubin Urine: NEGATIVE
Glucose, UA: NEGATIVE mg/dL
Hgb urine dipstick: NEGATIVE
Ketones, ur: NEGATIVE mg/dL
Leukocytes,Ua: NEGATIVE
Nitrite: NEGATIVE
Protein, ur: 100 mg/dL — AB
Specific Gravity, Urine: 1.013 (ref 1.005–1.030)
pH: 5 (ref 5.0–8.0)

## 2019-07-21 LAB — CMP (CANCER CENTER ONLY)
ALT: 31 U/L (ref 0–44)
AST: 15 U/L (ref 15–41)
Albumin: 2.9 g/dL — ABNORMAL LOW (ref 3.5–5.0)
Alkaline Phosphatase: 92 U/L (ref 38–126)
Anion gap: 9 (ref 5–15)
BUN: 33 mg/dL — ABNORMAL HIGH (ref 8–23)
CO2: 21 mmol/L — ABNORMAL LOW (ref 22–32)
Calcium: 8.4 mg/dL — ABNORMAL LOW (ref 8.9–10.3)
Chloride: 114 mmol/L — ABNORMAL HIGH (ref 98–111)
Creatinine: 1.62 mg/dL — ABNORMAL HIGH (ref 0.44–1.00)
GFR, Est AFR Am: 36 mL/min — ABNORMAL LOW (ref >60)
GFR, Estimated: 31 mL/min — ABNORMAL LOW (ref >60)
Glucose, Bld: 152 mg/dL — ABNORMAL HIGH (ref 70–99)
Potassium: 3.8 mmol/L (ref 3.5–5.1)
Sodium: 144 mmol/L (ref 135–145)
Total Bilirubin: 0.3 mg/dL (ref 0.3–1.2)
Total Protein: 6.3 g/dL — ABNORMAL LOW (ref 6.5–8.1)

## 2019-07-21 LAB — IRON AND TIBC
Iron: 29 ug/dL — ABNORMAL LOW (ref 41–142)
Saturation Ratios: 11 % — ABNORMAL LOW (ref 21–57)
TIBC: 269 ug/dL (ref 236–444)
UIBC: 240 ug/dL (ref 120–384)

## 2019-07-21 LAB — LACTATE DEHYDROGENASE: LDH: 315 U/L — ABNORMAL HIGH (ref 98–192)

## 2019-07-21 LAB — SAMPLE TO BLOOD BANK

## 2019-07-21 LAB — FERRITIN: Ferritin: 28 ng/mL (ref 11–307)

## 2019-07-21 LAB — TSH: TSH: 3.203 u[IU]/mL (ref 0.308–3.960)

## 2019-07-21 LAB — RETIC PANEL
Immature Retic Fract: 26.6 % — ABNORMAL HIGH (ref 2.3–15.9)
RBC.: 3.13 MIL/uL — ABNORMAL LOW (ref 3.87–5.11)
Retic Count, Absolute: 77.6 10*3/uL (ref 19.0–186.0)
Retic Ct Pct: 2.5 % (ref 0.4–3.1)
Reticulocyte Hemoglobin: 26.1 pg — ABNORMAL LOW (ref >27.9)

## 2019-07-21 LAB — VITAMIN B12: Vitamin B-12: 1260 pg/mL — ABNORMAL HIGH (ref 180–914)

## 2019-07-21 MED ORDER — CYANOCOBALAMIN 1000 MCG/ML IJ SOLN
1000.0000 ug | INTRAMUSCULAR | Status: DC
  Administered 2019-07-21: 10:00:00 1000 ug via INTRAMUSCULAR

## 2019-07-21 MED ORDER — CYANOCOBALAMIN 1000 MCG/ML IJ SOLN
INTRAMUSCULAR | Status: AC
  Filled 2019-07-21: qty 1

## 2019-07-21 MED ORDER — EPOETIN ALFA-EPBX 10000 UNIT/ML IJ SOLN
20000.0000 [IU] | Freq: Once | INTRAMUSCULAR | Status: AC
  Administered 2019-07-21: 10:00:00 20000 [IU] via SUBCUTANEOUS

## 2019-07-21 MED ORDER — EPOETIN ALFA-EPBX 10000 UNIT/ML IJ SOLN
INTRAMUSCULAR | Status: AC
  Filled 2019-07-21: qty 2

## 2019-07-21 NOTE — Patient Instructions (Signed)
Epoetin Alfa injection What is this medicine? EPOETIN ALFA (e POE e tin AL fa) helps your body make more red blood cells. This medicine is used to treat anemia caused by chronic kidney disease, cancer chemotherapy, or HIV-therapy. It may also be used before surgery if you have anemia. This medicine may be used for other purposes; ask your health care provider or pharmacist if you have questions. COMMON BRAND NAME(S): Epogen, Procrit, Retacrit What should I tell my health care provider before I take this medicine? They need to know if you have any of these conditions:  cancer  heart disease  high blood pressure  history of blood clots  history of stroke  low levels of folate, iron, or vitamin B12 in the blood  seizures  an unusual or allergic reaction to erythropoietin, albumin, benzyl alcohol, hamster proteins, other medicines, foods, dyes, or preservatives  pregnant or trying to get pregnant  breast-feeding How should I use this medicine? This medicine is for injection into a vein or under the skin. It is usually given by a health care professional in a hospital or clinic setting. If you get this medicine at home, you will be taught how to prepare and give this medicine. Use exactly as directed. Take your medicine at regular intervals. Do not take your medicine more often than directed. It is important that you put your used needles and syringes in a special sharps container. Do not put them in a trash can. If you do not have a sharps container, call your pharmacist or healthcare provider to get one. A special MedGuide will be given to you by the pharmacist with each prescription and refill. Be sure to read this information carefully each time. Talk to your pediatrician regarding the use of this medicine in children. While this drug may be prescribed for selected conditions, precautions do apply. Overdosage: If you think you have taken too much of this medicine contact a poison  control center or emergency room at once. NOTE: This medicine is only for you. Do not share this medicine with others. What if I miss a dose? If you miss a dose, take it as soon as you can. If it is almost time for your next dose, take only that dose. Do not take double or extra doses. What may interact with this medicine? Interactions have not been studied. This list may not describe all possible interactions. Give your health care provider a list of all the medicines, herbs, non-prescription drugs, or dietary supplements you use. Also tell them if you smoke, drink alcohol, or use illegal drugs. Some items may interact with your medicine. What should I watch for while using this medicine? Your condition will be monitored carefully while you are receiving this medicine. You may need blood work done while you are taking this medicine. This medicine may cause a decrease in vitamin B6. You should make sure that you get enough vitamin B6 while you are taking this medicine. Discuss the foods you eat and the vitamins you take with your health care professional. What side effects may I notice from receiving this medicine? Side effects that you should report to your doctor or health care professional as soon as possible:  allergic reactions like skin rash, itching or hives, swelling of the face, lips, or tongue  seizures  signs and symptoms of a blood clot such as breathing problems; changes in vision; chest pain; severe, sudden headache; pain, swelling, warmth in the leg; trouble speaking; sudden numbness or  weakness of the face, arm or leg  signs and symptoms of a stroke like changes in vision; confusion; trouble speaking or understanding; severe headaches; sudden numbness or weakness of the face, arm or leg; trouble walking; dizziness; loss of balance or coordination Side effects that usually do not require medical attention (report to your doctor or health care professional if they continue or are  bothersome):  chills  cough  dizziness  fever  headaches  joint pain  muscle cramps  muscle pain  nausea, vomiting  pain, redness, or irritation at site where injected This list may not describe all possible side effects. Call your doctor for medical advice about side effects. You may report side effects to FDA at 1-800-FDA-1088. Where should I keep my medicine? Keep out of the reach of children. Store in a refrigerator between 2 and 8 degrees C (36 and 46 degrees F). Do not freeze or shake. Throw away any unused portion if using a single-dose vial. Multi-dose vials can be kept in the refrigerator for up to 21 days after the initial dose. Throw away unused medicine. NOTE: This sheet is a summary. It may not cover all possible information. If you have questions about this medicine, talk to your doctor, pharmacist, or health care provider.  2020 Elsevier/Gold Standard (2016-10-12 08:35:19) Cyanocobalamin, Vitamin B12 injection What is this medicine? CYANOCOBALAMIN (sye an oh koe BAL a min) is a man made form of vitamin B12. Vitamin B12 is used in the growth of healthy blood cells, nerve cells, and proteins in the body. It also helps with the metabolism of fats and carbohydrates. This medicine is used to treat people who can not absorb vitamin B12. This medicine may be used for other purposes; ask your health care provider or pharmacist if you have questions. COMMON BRAND NAME(S): B-12 Compliance Kit, B-12 Injection Kit, Cyomin, LA-12, Nutri-Twelve, Physicians EZ Use B-12, Primabalt What should I tell my health care provider before I take this medicine? They need to know if you have any of these conditions:  kidney disease  Leber's disease  megaloblastic anemia  an unusual or allergic reaction to cyanocobalamin, cobalt, other medicines, foods, dyes, or preservatives  pregnant or trying to get pregnant  breast-feeding How should I use this medicine? This medicine is  injected into a muscle or deeply under the skin. It is usually given by a health care professional in a clinic or doctor's office. However, your doctor may teach you how to inject yourself. Follow all instructions. Talk to your pediatrician regarding the use of this medicine in children. Special care may be needed. Overdosage: If you think you have taken too much of this medicine contact a poison control center or emergency room at once. NOTE: This medicine is only for you. Do not share this medicine with others. What if I miss a dose? If you are given your dose at a clinic or doctor's office, call to reschedule your appointment. If you give your own injections and you miss a dose, take it as soon as you can. If it is almost time for your next dose, take only that dose. Do not take double or extra doses. What may interact with this medicine?  colchicine  heavy alcohol intake This list may not describe all possible interactions. Give your health care provider a list of all the medicines, herbs, non-prescription drugs, or dietary supplements you use. Also tell them if you smoke, drink alcohol, or use illegal drugs. Some items may interact with your  medicine. What should I watch for while using this medicine? Visit your doctor or health care professional regularly. You may need blood work done while you are taking this medicine. You may need to follow a special diet. Talk to your doctor. Limit your alcohol intake and avoid smoking to get the best benefit. What side effects may I notice from receiving this medicine? Side effects that you should report to your doctor or health care professional as soon as possible:  allergic reactions like skin rash, itching or hives, swelling of the face, lips, or tongue  blue tint to skin  chest tightness, pain  difficulty breathing, wheezing  dizziness  red, swollen painful area on the leg Side effects that usually do not require medical attention (report  to your doctor or health care professional if they continue or are bothersome):  diarrhea  headache This list may not describe all possible side effects. Call your doctor for medical advice about side effects. You may report side effects to FDA at 1-800-FDA-1088. Where should I keep my medicine? Keep out of the reach of children. Store at room temperature between 15 and 30 degrees C (59 and 85 degrees F). Protect from light. Throw away any unused medicine after the expiration date. NOTE: This sheet is a summary. It may not cover all possible information. If you have questions about this medicine, talk to your doctor, pharmacist, or health care provider.  2020 Elsevier/Gold Standard (2007-06-16 22:10:20)

## 2019-07-22 ENCOUNTER — Telehealth: Payer: Self-pay | Admitting: Nurse Practitioner

## 2019-07-22 LAB — URINE CULTURE: Culture: 70000 — AB

## 2019-07-22 LAB — FOLATE RBC
Folate, Hemolysate: 328 ng/mL
Folate, RBC: 1242 ng/mL (ref >498)
Hematocrit: 26.4 % — ABNORMAL LOW (ref 34.0–46.6)

## 2019-07-22 LAB — HAPTOGLOBIN: Haptoglobin: 179 mg/dL (ref 42–346)

## 2019-07-22 NOTE — Telephone Encounter (Signed)
Scheduled appt per 5/4 los.  Spoke with pt and she is aware of her appt date and time.

## 2019-07-23 NOTE — Telephone Encounter (Signed)
I spoke with [name], she is not having any urinary frequency or burning.  She said her urine just smells foul.  I encouraged her to increase her water intake.  I told her there was yeast noted in your urine culture, but Dr. Burr Medico feels this is contamination.   She verbalized understanding

## 2019-07-24 LAB — MULTIPLE MYELOMA PANEL, SERUM
Albumin SerPl Elph-Mcnc: 3 g/dL (ref 2.9–4.4)
Albumin/Glob SerPl: 1.1 (ref 0.7–1.7)
Alpha 1: 0.2 g/dL (ref 0.0–0.4)
Alpha2 Glob SerPl Elph-Mcnc: 0.8 g/dL (ref 0.4–1.0)
B-Globulin SerPl Elph-Mcnc: 0.9 g/dL (ref 0.7–1.3)
Gamma Glob SerPl Elph-Mcnc: 1 g/dL (ref 0.4–1.8)
Globulin, Total: 2.9 g/dL (ref 2.2–3.9)
IgA: 267 mg/dL (ref 64–422)
IgG (Immunoglobin G), Serum: 965 mg/dL (ref 586–1602)
IgM (Immunoglobulin M), Srm: 73 mg/dL (ref 26–217)
Total Protein ELP: 5.9 g/dL — ABNORMAL LOW (ref 6.0–8.5)

## 2019-07-27 ENCOUNTER — Ambulatory Visit: Payer: Medicare Other

## 2019-07-27 ENCOUNTER — Other Ambulatory Visit: Payer: Medicare Other

## 2019-07-27 ENCOUNTER — Ambulatory Visit: Payer: Medicare Other | Admitting: Nurse Practitioner

## 2019-08-08 DIAGNOSIS — N184 Chronic kidney disease, stage 4 (severe): Secondary | ICD-10-CM | POA: Diagnosis not present

## 2019-08-09 DIAGNOSIS — N184 Chronic kidney disease, stage 4 (severe): Secondary | ICD-10-CM | POA: Diagnosis not present

## 2019-08-10 ENCOUNTER — Inpatient Hospital Stay: Payer: Medicare Other

## 2019-08-10 ENCOUNTER — Ambulatory Visit: Payer: Medicare Other

## 2019-08-10 ENCOUNTER — Other Ambulatory Visit: Payer: Self-pay

## 2019-08-10 ENCOUNTER — Other Ambulatory Visit: Payer: Self-pay | Admitting: Nurse Practitioner

## 2019-08-10 ENCOUNTER — Other Ambulatory Visit: Payer: Medicare Other

## 2019-08-10 ENCOUNTER — Telehealth: Payer: Self-pay

## 2019-08-10 VITALS — BP 155/61 | HR 73 | Temp 97.8°F | Resp 18 | Wt 209.2 lb

## 2019-08-10 DIAGNOSIS — D464 Refractory anemia, unspecified: Secondary | ICD-10-CM

## 2019-08-10 DIAGNOSIS — D509 Iron deficiency anemia, unspecified: Secondary | ICD-10-CM

## 2019-08-10 DIAGNOSIS — D631 Anemia in chronic kidney disease: Secondary | ICD-10-CM

## 2019-08-10 DIAGNOSIS — D649 Anemia, unspecified: Secondary | ICD-10-CM

## 2019-08-10 DIAGNOSIS — Z853 Personal history of malignant neoplasm of breast: Secondary | ICD-10-CM

## 2019-08-10 DIAGNOSIS — N184 Chronic kidney disease, stage 4 (severe): Secondary | ICD-10-CM | POA: Diagnosis not present

## 2019-08-10 LAB — CBC WITH DIFFERENTIAL/PLATELET
Abs Immature Granulocytes: 0.06 10*3/uL (ref 0.00–0.07)
Basophils Absolute: 0 10*3/uL (ref 0.0–0.1)
Basophils Relative: 0 %
Eosinophils Absolute: 0 10*3/uL (ref 0.0–0.5)
Eosinophils Relative: 1 %
HCT: 23.2 % — ABNORMAL LOW (ref 36.0–46.0)
Hemoglobin: 7.1 g/dL — ABNORMAL LOW (ref 12.0–15.0)
Immature Granulocytes: 1 %
Lymphocytes Relative: 19 %
Lymphs Abs: 1.6 10*3/uL (ref 0.7–4.0)
MCH: 26.1 pg (ref 26.0–34.0)
MCHC: 30.6 g/dL (ref 30.0–36.0)
MCV: 85.3 fL (ref 80.0–100.0)
Monocytes Absolute: 0.8 10*3/uL (ref 0.1–1.0)
Monocytes Relative: 10 %
Neutro Abs: 5.7 10*3/uL (ref 1.7–7.7)
Neutrophils Relative %: 69 %
Platelets: 214 10*3/uL (ref 150–400)
RBC: 2.72 MIL/uL — ABNORMAL LOW (ref 3.87–5.11)
RDW: 15.8 % — ABNORMAL HIGH (ref 11.5–15.5)
WBC: 8.2 10*3/uL (ref 4.0–10.5)
nRBC: 0 % (ref 0.0–0.2)

## 2019-08-10 LAB — OCCULT BLOOD X 1 CARD TO LAB, STOOL
Fecal Occult Bld: POSITIVE — AB
Fecal Occult Bld: POSITIVE — AB
Fecal Occult Bld: POSITIVE — AB

## 2019-08-10 LAB — VITAMIN D 25 HYDROXY (VIT D DEFICIENCY, FRACTURES): Vit D, 25-Hydroxy: 12.15 ng/mL — ABNORMAL LOW (ref 30–100)

## 2019-08-10 LAB — VITAMIN B12: Vitamin B-12: 705 pg/mL (ref 180–914)

## 2019-08-10 MED ORDER — CYANOCOBALAMIN 1000 MCG/ML IJ SOLN: 1000.0000 ug | INTRAMUSCULAR | Status: DC

## 2019-08-10 MED ORDER — EPOETIN ALFA-EPBX 10000 UNIT/ML IJ SOLN: 20000.0000 [IU] | Freq: Once | INTRAMUSCULAR | Status: DC

## 2019-08-10 MED ORDER — SODIUM CHLORIDE 0.9 % IV SOLN
510.0000 mg | Freq: Once | INTRAVENOUS | Status: AC
  Administered 2019-08-10: 510 mg via INTRAVENOUS
  Filled 2019-08-10: qty 510

## 2019-08-10 MED ORDER — SODIUM CHLORIDE 0.9 % IV SOLN
Freq: Once | INTRAVENOUS | Status: AC
  Filled 2019-08-10: qty 250

## 2019-08-10 NOTE — Patient Instructions (Signed)

## 2019-08-10 NOTE — Progress Notes (Signed)
Since patient not able to receive retacrit on the same day as her iron therapy (insurance). Patient being scheduled for blood later this week for Hgb 7.1 - per Lacie, okay to do injections at that time.   Demetrius Charity, PharmD, BCPS, [facility] Pharmacist Pharmacy Phone: [phone] 08/10/2019

## 2019-08-10 NOTE — Telephone Encounter (Signed)
TC to pt per Cira Rue NP to let her know that her stool test is positive, which likely explains her worsening anemia lately. I let her know that Regan Rakers has sent a message to her GI Dr. Carlean Purl to get his input on further testing such as EGD/colonoscopy to identify a source of bleeding. Reminded her that she has f/u with Dr. Burr Medico next week, should know more then. In the meantime continue injections and supportive care. Patient verbalized understanding. No further problems or concerns at this time.

## 2019-08-10 NOTE — Progress Notes (Signed)
Error

## 2019-08-11 ENCOUNTER — Telehealth: Payer: Self-pay | Admitting: Hematology

## 2019-08-11 ENCOUNTER — Ambulatory Visit (INDEPENDENT_AMBULATORY_CARE_PROVIDER_SITE_OTHER): Payer: Medicare Other | Admitting: Physician Assistant

## 2019-08-11 ENCOUNTER — Encounter: Payer: Self-pay | Admitting: Physician Assistant

## 2019-08-11 VITALS — BP 148/64 | HR 84 | Ht 65.0 in | Wt 206.0 lb

## 2019-08-11 DIAGNOSIS — R195 Other fecal abnormalities: Secondary | ICD-10-CM | POA: Diagnosis not present

## 2019-08-11 DIAGNOSIS — D649 Anemia, unspecified: Secondary | ICD-10-CM

## 2019-08-11 NOTE — Progress Notes (Signed)
Subjective:    Patient ID: [name], female    DOB: January 02, 1946, 74 y.o.   MRN: [id number]  [place] is a pleasant 74 year old African-American female, established with Dr. Carlean Purl, who is referred today by Dr. Burr Medico for further evaluation of persistent multifactoral anemia, and recently documented Hemoccult positive stool. Patient has history of hypertension, coronary artery disease, prior history of small bowel obstruction and is status post remote right hemicolectomy and cholecystectomy.  She has adult onset diabetes mellitus with neuropathy, hyperparathyroidism, chronic kidney disease stage III, prior history of CVA, history of breast cancer. She has been followed by hematology for anemia of chronic disease and has been on chronic B12 supplementation, iron replacement and is currently on Retacrit every 2 weeks She has had a drift in hemoglobin despite the above aggressive measures and has required periodic transfusion.  Most recent hemoglobin a few weeks ago was 8.3 and then on recheck yesterday hemoglobin 7.1.  She did Hemoccults returned positive. Recent iron studies with ferritin 28/serum iron 29/TIBC 269/iron sat 11. Patient received a Feraheme infusion yesterday and says she is scheduled for transfusion later this week. She has no complaints of abdominal discomfort, changes in bowel habits but does state that intermittently she will notice very dark stools and that had been occurring some over the past month or so.  Intermittently she will also see a small amount of bright red blood on the tissue with wiping. Patient had undergone colonoscopy in February 2020 which was a negative exam with exception of a prior end-to-side anastomosis and finding of internal and external hemorrhoids. She had very remote EGD in 2003 showing esophagitis and a small gastric nodule. Patient is also concerned as she has had an increase in lower extremity edema over the past 4 to 6 weeks.  She was started on  Demadex about a week ago by her nephrologist but says she has had no change in her weight and no improvement in the lower extremity edema.  Review of Systems Pertinent positive and negative review of systems were noted in the above HPI section.  All other review of systems was otherwise negative.  Outpatient Encounter Medications as of 08/11/2019  Medication Sig  . acetaminophen (TYLENOL) 325 MG tablet Take 650 mg by mouth every 6 (six) hours as needed for mild pain.  Marland Kitchen amLODipine (NORVASC) 10 MG tablet Take 1 tablet (10 mg total) by mouth daily.  Marland Kitchen atorvastatin (LIPITOR) 40 MG tablet Take 40 mg by mouth daily.  . budesonide (ENTOCORT EC) 3 MG 24 hr capsule Take 3 mg by mouth daily.  . carvedilol (COREG) 25 MG tablet Take 25 mg by mouth 2 (two) times daily.  . colestipol (COLESTID) 1 g tablet Take 2 g by mouth at bedtime.  Marland Kitchen desloratadine (CLARINEX) 5 MG tablet Take 5 mg by mouth at bedtime as needed (allergies). Reported on 04/11/2015  . ferrous fumarate (HEMOCYTE - 106 MG FE) 325 (106 Fe) MG TABS tablet Take 1 tablet by mouth as directed.  . gabapentin (NEURONTIN) 300 MG capsule Take 1 capsule by mouth at bedtime  . hydrALAZINE (APRESOLINE) 100 MG tablet Take 1 tablet (100 mg total) by mouth 3 (three) times daily.  . hydrOXYzine (ATARAX/VISTARIL) 50 MG tablet Take 50 mg by mouth 2 (two) times daily as needed.  . isosorbide mononitrate (IMDUR) 30 MG 24 hr tablet Take 1/2 (one-half) tablet by mouth once daily  . LANTUS SOLOSTAR 100 UNIT/ML Solostar Pen SMARTSIG:14 Unit(s) SUB-Q Every Night  .  linagliptin (TRADJENTA) 5 MG TABS tablet Take 5 mg by mouth daily.  . Melatonin 5 MG TABS Take 10 mg by mouth at bedtime.  . nitroGLYCERIN (NITROSTAT) 0.4 MG SL tablet DISSOLVE 1 TABLET UNDER THE TONGUE EVERY 5 MINUTES AS  NEEDED FOR CHEST PAIN. MAX  OF 3 TABLETS IN 15 MINUTES. CALL 911 IF PAIN PERSISTS.  Marland Kitchen torsemide (DEMADEX) 20 MG tablet Take 20 mg by mouth daily.  Marland Kitchen ULORIC 40 MG tablet Take 1 tablet by  mouth daily.  . [DISCONTINUED] budesonide (ENTOCORT EC) 3 MG 24 hr capsule TAKE 3 CAPSULES BY MOUTH ONCE DAILY IN THE MORNING  . [DISCONTINUED] carvedilol (COREG) 12.5 MG tablet Take 1 tablet (12.5 mg total) by mouth 2 (two) times daily.  . [DISCONTINUED] furosemide (LASIX) 40 MG tablet Take 1 tablet (40 mg total) by mouth 2 (two) times daily. (Patient taking differently: Take 120 mg by mouth 2 (two) times daily. Take 2 tabs  in the am and one tab  at noon)  . [DISCONTINUED] hydrALAZINE (APRESOLINE) 10 MG tablet Take 10 mg by mouth 2 (two) times daily.   No facility-administered encounter medications on file as of 08/11/2019.   Allergies  Allergen Reactions  . Percocet [Oxycodone-Acetaminophen] Other (See Comments)    headaches  . Diazepam Other (See Comments)    REACTION: Agitation  . Haloperidol Lactate Nausea And Vomiting  . Lorazepam Nausea Only  . Morphine Sulfate Other (See Comments)    REACTION: Agitation  . Propoxyphene Hcl Nausea And Vomiting  . Tramadol Hcl Nausea And Vomiting  . Anesthetics, Halogenated     Per patient's daughter, she can not tolerate anesthetics.   . Etomidate     Heart stopped during anesthesia  . Fentanyl     Heart stopped during anesthesia   . Versed [Midazolam]     Heart stopped during anesthesia   Patient Active Problem List   Diagnosis Date Noted  . Iron deficiency anemia 08/19/2018  . Spondylosis of lumbar spine 12/24/2017  . Renal vein thrombosis ([place]) 09/17/2017  . Hoarse voice quality 09/09/2017  . Low back pain without sciatica 09/09/2017  . Labile blood glucose   . Sleep disturbance   . Leukocytosis   . Essential hypertension   . Urinary frequency   . Type 2 diabetes mellitus with peripheral neuropathy (H[facility])   . Benign essential HTN   . Stage 2 chronic kidney disease   . Dysphagia   . Debility   . Encephalopathy   . Pressure injury of skin 08/04/2017  . Pneumonia of left lung due to infectious organism   . Diffuse pulmonary  alveolar hemorrhage   . SOB (shortness of breath) 10/11/2016  . Hyperparathyroidism, secondary renal ([place]) 05/02/2016  . Gout 04/11/2015  . Diabetic neuropathy associated with type 2 diabetes mellitus ([place]) 01/06/2015  . SBO (small bowel obstruction) ([place]) 09/15/2013  . Breast cancer of lower-inner quadrant of right female breast ([place]) 03/18/2013  . Chronic venous insufficiency 01/06/2013  . Health care maintenance 05/08/2011  . Diabetes mellitus type 2 in obese ([place]) 05/03/2010  . Seasonal allergies 05/03/2010  . Labile hypertension   . CAD (coronary artery disease)   . Abdominal discomfort   . Anemia in chronic kidney disease   . History of stroke without residual deficits   . Hyperlipidemia associated with type 2 diabetes mellitus ([place])   . Chronic pain   . Chronic kidney disease, stage III (moderate)   . Adnexal mass   . Spinal stenosis, lumbar region,  with neurogenic claudication 01/03/2009   Social History   Socioeconomic History  . Marital status: Legally Separated    Spouse name: Not on file  . Number of children: 5  . Years of education: Not on file  . Highest education level: Not on file  Occupational History  . Occupation: retired    Fish farm manager: [employer]  Tobacco Use  . Smoking status: Never Smoker  . Smokeless tobacco: Never Used  Substance and Sexual Activity  . Alcohol use: No    Alcohol/week: 0.0 standard drinks  . Drug use: No  . Sexual activity: Not on file  Other Topics Concern  . Not on file  Social History Narrative   She is separated 2 sons 3 daughters   She has grandchildren and great-grandchildren   She is retired   Never smoker no tobacco now no drugs no alcohol no caffeine   Social Determinants of Radio broadcast assistant Strain:   . Difficulty of Paying Living Expenses:   Food Insecurity:   . Worried About Charity fundraiser in the Last Year:   . Arboriculturist in the Last Year:   Transportation Needs:   . Lexicographer (Medical):   Marland Kitchen Lack of Transportation (Non-Medical):   Physical Activity:   . Days of Exercise per Week:   . Minutes of Exercise per Session:   Stress:   . Feeling of Stress :   Social Connections:   . Frequency of Communication with Friends and Family:   . Frequency of Social Gatherings with Friends and Family:   . Attends Religious Services:   . Active Member of Clubs or Organizations:   . Attends Archivist Meetings:   Marland Kitchen Marital Status:   Intimate Partner Violence:   . Fear of Current or Ex-Partner:   . Emotionally Abused:   Marland Kitchen Physically Abused:   . Sexually Abused:     Ms. Luby family history includes Breast cancer in her maternal aunt and maternal grandmother; Breast cancer (age of onset: 56) in her mother; Clotting disorder in her maternal grandmother; Colon cancer in her maternal grandmother; Diabetes in her maternal grandmother; Heart attack in her maternal grandmother; Hypertension in her daughter; Kidney failure in her maternal aunt; Lung cancer in her maternal aunt; Pancreatic cancer (age of onset: 44) in her brother; Prostate cancer in her maternal uncle.      Objective:    Vitals:   08/11/19 1042  BP: (!) 148/64  Pulse: 84    Physical Exam Well-developed well-nourished older AA female  in no acute distress.  Height, [id number], BMI 34.2  HEENT; nontraumatic normocephalic, EOMI, PER R LA, sclera anicteric. Oropharynx;not done Neck; supple, no JVD Cardiovascular; regular rate and rhythm with S1-S2, no murmur rub or gallop Pulmonary; Clear bilaterally Abdomen; soft, nontender, nondistended, no palpable mass or hepatosplenomegaly, bowel sounds are active Rectal;not done, recently documented heme positive Skin; benign exam, no jaundice rash or appreciable lesions Extremities; 2+ pitting edema bilateral lower extremities to above  the knee Neuro/Psych; alert and oriented x4, grossly nonfocal mood and affect appropriate         Assessment & Plan:   #1 74 year old African-American female with history of chronic anemia felt to be anemia of chronic disease with both B12 and iron deficiency.  She is being managed aggressively by hematology and has been on chronic B12 supplementation as well as oral iron supplementation and just had an iron infusion yesterday because of persistent  findings of iron deficiency.  She has also been on Retacrit. Despite above measures she has had a drift in hemoglobin down to 7.1 as of yesterday and was found to have heme positive stools and complaint of intermittent dark stool recently.  Patient had colonoscopy a little over a year ago which was negative other than internal and external hemorrhoids which may explain her low volume bright red blood she sees on occasion with wiping. I am concerned that she has an upper GI source for heme positive stool and intermittent dark stools.  Rule out chronic gastropathy, rule out AVMs  #2 chronic kidney disease stage III 4 #3 lower extremity edema, progressive recently 4.  Coronary artery disease 5.  Hypertension 6.  Adult onset diabetes mellitus with neuropathy 7.  Prior history of breast cancer 8.  Status post right hemicolectomy 9.  Status post cholecystectomy 10.  Prior history of CVA #11 questionable diagnosis of microscopic colitis, reviewing meds she has been on Entocort but says she only takes 3 mg once daily which controls diarrhea well, if she comes off Entocort diarrhea recurs.  She had colon biopsies done from 2010 which did not show any evidence of microscopic colitis.  Plan; patient will be scheduled for upper endoscopy with Dr. Carlean Purl.  Procedure was discussed in detail with the patient including indications risks and benefits and she is agreeable to proceed. If EGD is unrevealing she will need capsule endoscopy which was also discussed today. Patient has completed COVID-19 vaccination. Continue Entocort 3 mg p.o. daily for now as  controlling diarrhea as outlined above.  Adriahna Shearman S Ladanian Kelter PA-C 08/11/2019   Cc: Seward Carol, MD

## 2019-08-11 NOTE — Patient Instructions (Addendum)
If you are age 72 or older, your body mass index should be between 23-30. Your Body mass index is 34.28 kg/m. If this is out of the aforementioned range listed, please consider follow up with your Primary Care Provider.  If you are age 24 or younger, your body mass index should be between 19-25. Your Body mass index is 34.28 kg/m. If this is out of the aformentioned range listed, please consider follow up with your Primary Care Provider.   You have been scheduled for an endoscopy. Please follow written instructions given to you at your visit today. If you use inhalers (even only as needed), please bring them with you on the day of your procedure.  Continue your Entocort 3 mg once daily.  Follow up pending the results of your Endoscopy.

## 2019-08-11 NOTE — Telephone Encounter (Signed)
Called pt per 5/25 sch message - no answer left message with new appt.

## 2019-08-11 NOTE — Progress Notes (Signed)
[place]   Telephone:([phone]) [phone] Fax:([phone]) [phone]   Clinic Follow up Note   Patient Care Team: Seward Carol, MD as PCP - General (Internal Medicine) Josue Hector, MD as PCP - Cardiology (Cardiology) Thelma Comp, [place] as Consulting Physician (Optometry) Stark Klein, MD as Consulting Physician (General Surgery) Truitt Merle, MD as Consulting Physician (Hematology) Madelon Lips, MD as Consulting Physician (Nephrology)  Date of Service:  08/13/2019  CHIEF COMPLAINT: Followup of rightbreast cancer and anemia of chronic disease  SUMMARY OF ONCOLOGIC HISTORY: Oncology History Overview Note  Cancer Staging Breast cancer of lower-inner quadrant of right female breast [facility]) Staging form: Breast, AJCC 7th Edition - Clinical: Stage IA (T1b, N0, cM0) - Unsigned Staging comments: Staged at breast conference 03/25/13.  - Pathologic stage from 05/12/2013: Stage IA (T1b, N0, cM0) - Signed by Truitt Merle, MD on 06/21/2016     Breast cancer of lower-inner quadrant of right female breast ([place])  02/27/2013 Mammogram   Diagnostic mammogram and ultrasound showed a 6 x 5 mm mass at 5:00 position. Axilla was negative by ultrasound.   03/16/2013 Initial Biopsy   Right breast 5:00 position biopsy showed invasive ductal carcinoma, low-grade   03/16/2013 Receptors her2   ER100% positive, PR 100% positive, HER-2 negative, Ki-67 20%   03/18/2013 Initial Diagnosis   Breast cancer of lower-inner quadrant of right female breast ([place])   05/12/2013 Surgery   Right breast lumpectomy with sentinel lymph node biopsy   05/12/2013 Pathology Results   Right breast lumpectomy showed invasive ductal carcinoma, grade 2, 0.9 cm, 6 sentinel lymph nodes were all negative. Margins were negative.   05/12/2013 Oncotype testing   Oncotype recurrence score 19, intermediate risk, 10 year risk of distant recurrence 12% with tamoxifen for 5 years. Patient declined adjuvant chemotherapy.     06/02/2013 - 07/16/2013 Radiation Therapy   Adjuvant breast radiation with boost 1600Gy   08/06/2013 - 07/2017 Anti-estrogen oral therapy   Arimidex 1 mg daily. Stopped in 07/2017 due to acute illness    03/22/2017 Mammogram   IMPRESSION: No mammographic evidence for malignancy.      CURRENT THERAPY:  -Retacritinjection starting 09/2018, currently q2weeks. Increased to 20K units starting 06/29/19 -IV Feraheme as needed starting 09/15/18. Last on 08/10/19 -Oral Ferrous sulfate daily -B12 injection monthly starting 05/04/19. Increased to every 2 weeks on 06/29/19.   INTERVAL HISTORY:  [name] is here for a follow up of her anemia and blood transfusion.  She presents to the clinic alone.  She reports worsening dyspnea on exertion, 1 day after doing grocery at a store, she had to call her daughter to pick her up because of the dyspnea.  She denies chest pain.  Her appetite is normal.  She has noticed red blood stool intermittently in the past months, she was seen by GI yesterday, and plan to have EGD in 2 weeks.  She did have a colonoscopy a year ago, which showed hemorrhoids only.  ROS otherwise negative.   MEDICAL HISTORY:  Past Medical History:  Diagnosis Date  . Abdominal discomfort    Chronic N/V/D. Presumptive dx Crohn's dx per elevated p ANCA. Failed Entocort and Pentasa. Sep 2003 - ileocolectomy c anastomosis per Dr Deon Pilling 2/2 adhesions - path was hegative for Crohns. EGD, Sm bowel follow through (11/03), and an eteroclysis (10/03) were unrevealing. Cuases hypomag and hypocalcemia.  . Adnexal mass 8/03   s/p lap BSO (R ovarian fibroma) & lysis of adhesions  . Allergy  Seasonal  . Anemia    Multifactorial. Baseline HgB 10-11 ish. B12 def - 150 in 3/10. Fe Def - ferritin 35 3/10. Both are being repleted.  . Breast cancer ([place]) 03/16/13   right, 5 o'clock  . CAD (coronary artery disease) 1996   1996 - PTCA and angioplasty diagonal branch. 2000 - Rotoblator & angiopllasty of  diagonal. 2006 - subendocardial AMI, DES to proximal LAD.Marland Kitchen Also had 90% stenosis in distal apical LAD. EF 55 with apical hypokinesis. Indefinite ASA and Plavix.  . CHF (congestive heart failure) ([place])   . Chronic kidney disease    Chronic renal insuff baseline Cr 1.2 - 1.4 ish.  . Chronic pain    CT 10/10 = Spinal stenosis L2 - S1.  . Diabetes mellitus    Insulin dependent  . Hx of radiation therapy 06/02/13- 07/16/13   right rbeast 4500 cGy 25 sessions, right breast boost 1600 cGy in 8 sessions  . Hyperlipidemia    Managed with both a statin and Welchol. Welchol stopped 2014 2/2 cost and started on fenofibrate   . Hypertension    2006 B renal arteries patent. 2003 MRA - no RAS. 2003 pheo W/U Dr Hassell Done reportedly negative.  . Hypoxia 07/23/2017  . Lupus ([place])   . Personal history of radiation therapy 2015  . RBBB   . SBO (small bowel obstruction) ([place]) 09/17/2017  . Stroke [facility])    Incidental finding MRI 2002 L lacunar infarct  . Wears dentures    top    SURGICAL HISTORY: Past Surgical History:  Procedure Laterality Date  . ABDOMINAL HYSTERECTOMY    . BILATERAL SALPINGOOPHORECTOMY  8/03   Lap BSO (R ovarian fibroma) and adhesion lysis  . BOWEL RESECTION  2003   ileocolectomy with anastomosis 2/2 adhesions  . BREAST BIOPSY Right 2015  . BREAST BIOPSY Right 07/2014  . BREAST LUMPECTOMY Right 04/22/2013  . BREAST LUMPECTOMY WITH NEEDLE LOCALIZATION AND AXILLARY SENTINEL LYMPH NODE BX Right 04/22/2013   Procedure: BREAST LUMPECTOMY WITH NEEDLE LOCALIZATION AND AXILLARY SENTINEL LYMPH NODE BX;  Surgeon: Stark Klein, MD;  Location: [place];  Service: General;  Laterality: Right;  . CARDIAC CATHETERIZATION     2 stents  . CHOLECYSTECTOMY    . COLONOSCOPY    . HEMICOLECTOMY     R sided hemicolectomy  . HERNIA REPAIR     Ventral hernia repair  . PTCA  4/06    I have reviewed the social history and family history with the patient and they are unchanged from previous  note.  ALLERGIES:  is allergic to percocet [oxycodone-acetaminophen]; diazepam; haloperidol lactate; lorazepam; morphine sulfate; propoxyphene hcl; tramadol hcl; anesthetics, halogenated; etomidate; fentanyl; and versed [midazolam].  MEDICATIONS:  Current Outpatient Medications  Medication Sig Dispense Refill  . acetaminophen (TYLENOL) 325 MG tablet Take 650 mg by mouth every 6 (six) hours as needed for mild pain.    Marland Kitchen amLODipine (NORVASC) 10 MG tablet Take 1 tablet (10 mg total) by mouth daily. 30 tablet 0  . atorvastatin (LIPITOR) 40 MG tablet Take 40 mg by mouth daily.    . budesonide (ENTOCORT EC) 3 MG 24 hr capsule Take 3 mg by mouth daily.    . carvedilol (COREG) 25 MG tablet Take 25 mg by mouth 2 (two) times daily.    . colestipol (COLESTID) 1 g tablet Take 2 g by mouth at bedtime.    Marland Kitchen desloratadine (CLARINEX) 5 MG tablet Take 5 mg by mouth at bedtime as needed (allergies).  Reported on 04/11/2015    . ferrous fumarate (HEMOCYTE - 106 MG FE) 325 (106 Fe) MG TABS tablet Take 1 tablet by mouth as directed.    . gabapentin (NEURONTIN) 300 MG capsule Take 1 capsule by mouth at bedtime 30 capsule 0  . hydrALAZINE (APRESOLINE) 100 MG tablet Take 1 tablet (100 mg total) by mouth 3 (three) times daily. 270 tablet 1  . hydrOXYzine (ATARAX/VISTARIL) 50 MG tablet Take 50 mg by mouth 2 (two) times daily as needed.    . isosorbide mononitrate (IMDUR) 30 MG 24 hr tablet Take 1/2 (one-half) tablet by mouth once daily 45 tablet 6  . LANTUS SOLOSTAR 100 UNIT/ML Solostar Pen SMARTSIG:14 Unit(s) SUB-Q Every Night    . linagliptin (TRADJENTA) 5 MG TABS tablet Take 5 mg by mouth daily.    . Melatonin 5 MG TABS Take 10 mg by mouth at bedtime.    . nitroGLYCERIN (NITROSTAT) 0.4 MG SL tablet DISSOLVE 1 TABLET UNDER THE TONGUE EVERY 5 MINUTES AS  NEEDED FOR CHEST PAIN. MAX  OF 3 TABLETS IN 15 MINUTES. CALL 911 IF PAIN PERSISTS. 100 tablet 3  . torsemide (DEMADEX) 20 MG tablet Take 20 mg by mouth daily.    Marland Kitchen  ULORIC 40 MG tablet Take 1 tablet by mouth daily.    . Vitamin D, Ergocalciferol, (DRISDOL) 1.25 MG (50000 UNIT) CAPS capsule Take 1 capsule (50,000 Units total) by mouth every 7 (seven) days. 8 capsule 0   No current facility-administered medications for this visit.    PHYSICAL EXAMINATION: ECOG PERFORMANCE STATUS: 2 - Symptomatic, <50% confined to bed  Vitals:   08/13/19 0800  BP: 133/65  Pulse: 75  Resp: 20  Temp: 97.7 F (36.5 C)  SpO2: 96%   Filed Weights   08/13/19 0800  Weight: 91.2 kg    GENERAL:alert, no distress and comfortable SKIN: skin color, texture, turgor are normal, no rashes or significant lesions NEURO: alert & oriented x 3 with fluent speech, no focal motor/sensory deficits  LABORATORY DATA:  I have reviewed the data as listed CBC Latest Ref Rng & Units 08/10/2019 07/21/2019 07/21/2019  WBC 4.0 - 10.5 K/uL 8.2 8.9 -  Hemoglobin 12.0 - 15.0 g/dL 7.1(L) 8.3(L) -  Hematocrit 36.0 - 46.0 % 23.2(L) 27.4(L) 26.4(L)  Platelets 150 - 400 K/uL 214 178 -     CMP Latest Ref Rng & Units 07/21/2019 06/29/2019 05/23/2019  Glucose 70 - 99 mg/dL 152(H) 122(H) -  BUN 8 - 23 mg/dL 33(H) 68(H) -  Creatinine 0.44 - 1.00 mg/dL 1.62(H) 2.55(H) -  Sodium 135 - 145 mmol/L 144 143 143  Potassium 3.5 - 5.1 mmol/L 3.8 4.1 4.3  Chloride 98 - 111 mmol/L 114(H) 104 -  CO2 22 - 32 mmol/L 21(L) 31 -  Calcium 8.9 - 10.3 mg/dL 8.4(L) 8.6(L) -  Total Protein 6.5 - 8.1 g/dL 6.3(L) 7.0 -  Total Bilirubin 0.3 - 1.2 mg/dL 0.3 0.3 -  Alkaline Phos 38 - 126 U/L 92 81 -  AST 15 - 41 U/L 15 17 -  ALT 0 - 44 U/L 31 30 -      RADIOGRAPHIC STUDIES: I have personally reviewed the radiological images as listed and agreed with the findings in the report. No results found.   ASSESSMENT & PLAN:  [name] is a 74 y.o. female with    1. Anemia of chronic disease (CKD stage IV), Ironand B12deficiencyand GI bleeding  -Her 04/2018 Colonoscopy with Dr Carlean Purl showed no evidence  of GI  bleedingor source of bleeding. Heranemia is probably related to her CKD, however her anemia is out of proportion of her stage IV CKD related anemia. -Her 09/09/18 bone marrow biopsyresults showed hypercellular marrow, no evidence of MDS or other concerns.Her Cytogenetics is still pending. -Due to low ferritin from her CKD she received one dose iv ferahemeon 09/15/18, and again early this week . -Patient receives a B12 shot every month at PCP's office. Started receiving monthly B12 injections in our clinic on 05/04/19. Was increased to q2weeks on 06/29/19.  -Herinsurancedid not approve Aranesp so she was switched to Retacrit every 2 weekson 11/14/18. She is tolerating well. Goal is Hg 9-11. I have increased her dose to 20,000 unit every 2 weeks, due to her worsening anemia lately, will increase to 40,000 every 2 weeks.  If results still inadequate, will change to weekly. -Continue OTC iron pill ferrous sulfate 1-2 tab dailyfor irondeficiency -Her anemia has worsened lately. She required blood transfusion on 07/28/19 and again today, due to her hemoglobin being 7.1 earlier this week. -We will repeat labs every 2 weeks for close monitoring, will give IV iron if ferritin less than 100 -Her recent B12 level was normal  2.Breast cancer of the lower inner quadrant of the right breast, invasive ductal carcinoma with DCIS, pT1bN0Mo, stage IA, ER+ and PR +, HER 2 negative -She was diagnosed in 02/2013 and treated with right lumpectomy, adjuvant radiation andAnastrozolefor 4 years until5/2019.Anastrozole was stopped after she developed septic shock from pneumonia and a cardiac arrest,and we decided not restarted due to the risk of cardiac toxicity. -From a breast cancer standpoint she is clinically doing well. Her 03/2019 mammogram was normal. There is no clinical concern for recurrence. -She isover 6years out since diagnosis. Continue breast cancer surveillance, Next mammogram in  03/2020   3. DM, HTN, CAD -managedby PCP and Dr. Johnsie Cancel the cardiologist.  -Currently on Lantis, tradjentra for DM    4. Chronic renal insufficieny, CKD stageIV -She follows up with Dr.Uptonfor herkidney function.  -She knows to avoid nephrotoxins -Cr at 2.55 today (06/29/19). I recommend she f/u with her nephrologist.    5. Low Backpain, from degenerative arthritis and spinal stenosis - f/u with Orthopedics -She is currently getting injections with orthopedist  6. Genetics results negative  7. Bone Health  -DEXA from 04/2018 shows slightly improved normal bone density with lowest T-score of 0.9 at forearm radius. -Her VitD level is low. She will continue 1000u daily.  8. Left Renal Vein Thrombosisin 07/2017 -Likely provoked from acute illnessand prior SBO -She stopped Xarelto in 01/2018 -Her hypercoagulopathy work-up in November 2019 was negative  9. History of pneumoniaand cardiac arrestin 07/2017 -10/16/17 CXR showed residual peribronchial disease,atelectasis, scarring or potentially pulmonary nodules. -Her latest CT chest from 01/27/18 showsNo acute findings. Scarring in the left upper lobe.   PLAN -Labs reviewed, will proceed withRetacrittoday at increased dose 40K units every 2 weeks -Continue B12 injection  monthly -She will receive 2 units of blood today -Lab in 2 weeks, follow-up in 4 weeks  No problem-specific Assessment & Plan notes found for this encounter.   No orders of the defined types were placed in this encounter.  All questions were answered. The patient knows to call the clinic with any problems, questions or concerns. No barriers to learning was detected. The total time spent in the appointment was 25 minutes.     Truitt Merle, MD 08/13/2019   I, Joslyn Devon, am acting as scribe for [employer]  Burr Medico, MD.   I have reviewed the above documentation for accuracy and completeness, and I agree with the above.

## 2019-08-12 ENCOUNTER — Other Ambulatory Visit: Payer: Self-pay | Admitting: Emergency Medicine

## 2019-08-12 DIAGNOSIS — D649 Anemia, unspecified: Secondary | ICD-10-CM

## 2019-08-12 LAB — PREPARE RBC (CROSSMATCH)

## 2019-08-13 ENCOUNTER — Encounter: Payer: Self-pay | Admitting: Hematology

## 2019-08-13 ENCOUNTER — Other Ambulatory Visit: Payer: Self-pay

## 2019-08-13 ENCOUNTER — Inpatient Hospital Stay (HOSPITAL_BASED_OUTPATIENT_CLINIC_OR_DEPARTMENT_OTHER): Payer: Medicare Other | Admitting: Hematology

## 2019-08-13 ENCOUNTER — Inpatient Hospital Stay: Payer: Medicare Other

## 2019-08-13 VITALS — BP 133/65 | HR 75 | Temp 97.7°F | Resp 20 | Ht 65.0 in | Wt 201.1 lb

## 2019-08-13 VITALS — BP 178/79 | HR 68 | Temp 98.1°F | Resp 18

## 2019-08-13 DIAGNOSIS — D649 Anemia, unspecified: Secondary | ICD-10-CM

## 2019-08-13 DIAGNOSIS — C50311 Malignant neoplasm of lower-inner quadrant of right female breast: Secondary | ICD-10-CM | POA: Diagnosis not present

## 2019-08-13 DIAGNOSIS — N184 Chronic kidney disease, stage 4 (severe): Secondary | ICD-10-CM

## 2019-08-13 DIAGNOSIS — Z170 Estrogen receptor positive status [ER+]: Secondary | ICD-10-CM

## 2019-08-13 DIAGNOSIS — D509 Iron deficiency anemia, unspecified: Secondary | ICD-10-CM

## 2019-08-13 DIAGNOSIS — D631 Anemia in chronic kidney disease: Secondary | ICD-10-CM

## 2019-08-13 MED ORDER — EPOETIN ALFA-EPBX 40000 UNIT/ML IJ SOLN
40000.0000 [IU] | Freq: Once | INTRAMUSCULAR | Status: AC
  Administered 2019-08-13: 40000 [IU] via SUBCUTANEOUS

## 2019-08-13 MED ORDER — SODIUM CHLORIDE 0.9% IV SOLUTION
250.0000 mL | Freq: Once | INTRAVENOUS | Status: AC
  Administered 2019-08-13: 250 mL via INTRAVENOUS
  Filled 2019-08-13: qty 250

## 2019-08-13 MED ORDER — VITAMIN D (ERGOCALCIFEROL) 1.25 MG (50000 UNIT) PO CAPS: 50000.0000 [IU] | ORAL_CAPSULE | ORAL | 0 refills | Status: DC

## 2019-08-13 MED ORDER — CYANOCOBALAMIN 1000 MCG/ML IJ SOLN
1000.0000 ug | INTRAMUSCULAR | Status: DC
  Administered 2019-08-13: 1000 ug via INTRAMUSCULAR

## 2019-08-13 MED ORDER — CYANOCOBALAMIN 1000 MCG/ML IJ SOLN
INTRAMUSCULAR | Status: AC
  Filled 2019-08-13: qty 1

## 2019-08-13 MED ORDER — EPOETIN ALFA-EPBX 40000 UNIT/ML IJ SOLN
INTRAMUSCULAR | Status: AC
  Filled 2019-08-13: qty 1

## 2019-08-13 NOTE — Patient Instructions (Signed)
Cyanocobalamin, Pyridoxine, and Folate What is this medicine? A multivitamin containing folic acid, vitamin B6, and vitamin B12. This medicine may be used for other purposes; ask your health care provider or pharmacist if you have questions. COMMON BRAND NAME(S): AllanFol RX, AllanTex, Av-Vite FB, B Complex with Folic Acid, ComBgen, FaBB, Folamin, Folastin, [place], [place], [place], [place], [place], [employer], [place] RX 2.2, [place], [place] 2.2, Foltabs 800, Foltx, Homocysteine Formula, Niva-Fol, NuFol, TL [employer], Virt-Gard, Virt-Vite, Virt-Vite [place], Vita-Respa What should I tell my health care provider before I take this medicine? They need to know if you have any of these conditions:  bleeding or clotting disorder  history of anemia of any type  other chronic health condition  an unusual or allergic reaction to vitamins, other medicines, foods, dyes, or preservatives  pregnant or trying to get pregnant  breast-feeding How should I use this medicine? Take by mouth with a glass of water. May take with food. Follow the directions on the prescription label. It is usually given once a day. Do not take your medicine more often than directed. Contact your pediatrician regarding the use of this medicine in children. Special care may be needed. Overdosage: If you think you have taken too much of this medicine contact a poison control center or emergency room at once. NOTE: This medicine is only for you. Do not share this medicine with others. What if I miss a dose? If you miss a dose, take it as soon as you can. If it is almost time for your next dose, take only that dose. Do not take double or extra doses. What may interact with this medicine?  levodopa This list may not describe all possible interactions. Give your health care provider a list of all the medicines, herbs, non-prescription drugs, or dietary supplements you use. Also tell them if you smoke, drink alcohol, or use illegal  drugs. Some items may interact with your medicine. What should I watch for while using this medicine? See your health care professional for regular checks on your progress. Remember that vitamin supplements do not replace the need for good nutrition from a balanced diet. What side effects may I notice from receiving this medicine? Side effects that you should report to your doctor or health care professional as soon as possible:  allergic reaction such as skin rash or difficulty breathing  vomiting Side effects that usually do not require medical attention (report to your doctor or health care professional if they continue or are bothersome):  nausea  stomach upset This list may not describe all possible side effects. Call your doctor for medical advice about side effects. You may report side effects to FDA at 1-800-FDA-1088. Where should I keep my medicine? Keep out of the reach of children. Most vitamins should be stored at controlled room temperature. Check your specific product directions. Protect from heat and moisture. Throw away any unused medicine after the expiration date. NOTE: This sheet is a summary. It may not cover all possible information. If you have questions about this medicine, talk to your doctor, pharmacist, or health care provider.  2020 Elsevier/Gold Standard (2007-04-26 00:59:55) Epoetin Alfa injection What is this medicine? EPOETIN ALFA (e POE e tin AL fa) helps your body make more red blood cells. This medicine is used to treat anemia caused by chronic kidney disease, cancer chemotherapy, or HIV-therapy. It may also be used before surgery if you have anemia. This medicine may be used for other purposes; ask your health care  provider or pharmacist if you have questions. COMMON BRAND NAME(S): Epogen, Procrit, Retacrit What should I tell my health care provider before I take this medicine? They need to know if you have any of these conditions:  cancer  heart  disease  high blood pressure  history of blood clots  history of stroke  low levels of folate, iron, or vitamin B12 in the blood  seizures  an unusual or allergic reaction to erythropoietin, albumin, benzyl alcohol, hamster proteins, other medicines, foods, dyes, or preservatives  pregnant or trying to get pregnant  breast-feeding How should I use this medicine? This medicine is for injection into a vein or under the skin. It is usually given by a health care professional in a hospital or clinic setting. If you get this medicine at home, you will be taught how to prepare and give this medicine. Use exactly as directed. Take your medicine at regular intervals. Do not take your medicine more often than directed. It is important that you put your used needles and syringes in a special sharps container. Do not put them in a trash can. If you do not have a sharps container, call your pharmacist or healthcare provider to get one. A special MedGuide will be given to you by the pharmacist with each prescription and refill. Be sure to read this information carefully each time. Talk to your pediatrician regarding the use of this medicine in children. While this drug may be prescribed for selected conditions, precautions do apply. Overdosage: If you think you have taken too much of this medicine contact a poison control center or emergency room at once. NOTE: This medicine is only for you. Do not share this medicine with others. What if I miss a dose? If you miss a dose, take it as soon as you can. If it is almost time for your next dose, take only that dose. Do not take double or extra doses. What may interact with this medicine? Interactions have not been studied. This list may not describe all possible interactions. Give your health care provider a list of all the medicines, herbs, non-prescription drugs, or dietary supplements you use. Also tell them if you smoke, drink alcohol, or use illegal  drugs. Some items may interact with your medicine. What should I watch for while using this medicine? Your condition will be monitored carefully while you are receiving this medicine. You may need blood work done while you are taking this medicine. This medicine may cause a decrease in vitamin B6. You should make sure that you get enough vitamin B6 while you are taking this medicine. Discuss the foods you eat and the vitamins you take with your health care professional. What side effects may I notice from receiving this medicine? Side effects that you should report to your doctor or health care professional as soon as possible:  allergic reactions like skin rash, itching or hives, swelling of the face, lips, or tongue  seizures  signs and symptoms of a blood clot such as breathing problems; changes in vision; chest pain; severe, sudden headache; pain, swelling, warmth in the leg; trouble speaking; sudden numbness or weakness of the face, arm or leg  signs and symptoms of a stroke like changes in vision; confusion; trouble speaking or understanding; severe headaches; sudden numbness or weakness of the face, arm or leg; trouble walking; dizziness; loss of balance or coordination Side effects that usually do not require medical attention (report to your doctor or health care professional if  they continue or are bothersome):  chills  cough  dizziness  fever  headaches  joint pain  muscle cramps  muscle pain  nausea, vomiting  pain, redness, or irritation at site where injected This list may not describe all possible side effects. Call your doctor for medical advice about side effects. You may report side effects to FDA at 1-800-FDA-1088. Where should I keep my medicine? Keep out of the reach of children. Store in a refrigerator between 2 and 8 degrees C (36 and 46 degrees F). Do not freeze or shake. Throw away any unused portion if using a single-dose vial. Multi-dose vials can be  kept in the refrigerator for up to 21 days after the initial dose. Throw away unused medicine. NOTE: This sheet is a summary. It may not cover all possible information. If you have questions about this medicine, talk to your doctor, pharmacist, or health care provider.  2020 Elsevier/Gold Standard (2016-10-12 08:35:19) Blood Transfusion, Adult, Care After This sheet gives you information about how to care for yourself after your procedure. Your doctor may also give you more specific instructions. If you have problems or questions, contact your doctor. What can I expect after the procedure? After the procedure, it is common to have:  Bruising and soreness at the IV site.  A fever or chills on the day of the procedure. This may be your body's response to the new blood cells received.  A headache. Follow these instructions at home: Insertion site care      Follow instructions from your doctor about how to take care of your insertion site. This is where an IV tube was put into your vein. Make sure you: ? Wash your hands with soap and water before and after you change your bandage (dressing). If you cannot use soap and water, use hand sanitizer. ? Change your bandage as told by your doctor.  Check your insertion site every day for signs of infection. Check for: ? Redness, swelling, or pain. ? Bleeding from the site. ? Warmth. ? Pus or a bad smell. General instructions  Take over-the-counter and prescription medicines only as told by your doctor.  Rest as told by your doctor.  Go back to your normal activities as told by your doctor.  Keep all follow-up visits as told by your doctor. This is important. Contact a doctor if:  You have itching or red, swollen areas of skin (hives).  You feel worried or nervous (anxious).  You feel weak after doing your normal activities.  You have redness, swelling, warmth, or pain around the insertion site.  You have blood coming from the  insertion site, and the blood does not stop with pressure.  You have pus or a bad smell coming from the insertion site. Get help right away if:  You have signs of a serious reaction. This may be coming from an allergy or the body's defense system (immune system). Signs include: ? Trouble breathing or shortness of breath. ? Swelling of the face or feeling warm (flushed). ? Fever or chills. ? Head, chest, or back pain. ? Dark pee (urine) or blood in the pee. ? Widespread rash. ? Fast heartbeat. ? Feeling dizzy or light-headed. You may receive your blood transfusion in an outpatient setting. If so, you will be told whom to contact to report any reactions. These symptoms may be an emergency. Do not wait to see if the symptoms will go away. Get medical help right away. Call your local emergency  services (911 in the [place] Do not drive yourself to the hospital. Summary  Bruising and soreness at the IV site are common.  Check your insertion site every day for signs of infection.  Rest as told by your doctor. Go back to your normal activities as told by your doctor.  Get help right away if you have signs of a serious reaction. This information is not intended to replace advice given to you by your health care provider. Make sure you discuss any questions you have with your health care provider. Document Revised: 08/28/2018 Document Reviewed: 08/28/2018 Elsevier Patient Education  [place].

## 2019-08-14 ENCOUNTER — Telehealth: Payer: Self-pay | Admitting: Hematology

## 2019-08-14 LAB — TYPE AND SCREEN
ABO/RH(D): B NEG
Antibody Screen: NEGATIVE
Unit division: 0

## 2019-08-14 LAB — BPAM RBC
Blood Product Expiration Date: 202106192359
ISSUE DATE / TIME: 202105270906
Unit Type and Rh: 1700

## 2019-08-14 NOTE — Telephone Encounter (Signed)
Scheduled appt per 5/27 los.  Spoke with pt and they are aware of the appt date and time.

## 2019-08-18 ENCOUNTER — Other Ambulatory Visit: Payer: Self-pay

## 2019-08-18 ENCOUNTER — Other Ambulatory Visit: Payer: Self-pay | Admitting: Nurse Practitioner

## 2019-08-18 ENCOUNTER — Inpatient Hospital Stay: Payer: Medicare Other | Attending: Internal Medicine

## 2019-08-18 VITALS — BP 179/79 | HR 75 | Temp 98.4°F | Resp 18

## 2019-08-18 DIAGNOSIS — D631 Anemia in chronic kidney disease: Secondary | ICD-10-CM | POA: Insufficient documentation

## 2019-08-18 DIAGNOSIS — E538 Deficiency of other specified B group vitamins: Secondary | ICD-10-CM | POA: Insufficient documentation

## 2019-08-18 DIAGNOSIS — N184 Chronic kidney disease, stage 4 (severe): Secondary | ICD-10-CM | POA: Insufficient documentation

## 2019-08-18 DIAGNOSIS — D509 Iron deficiency anemia, unspecified: Secondary | ICD-10-CM

## 2019-08-18 MED ORDER — SODIUM CHLORIDE 0.9 % IV SOLN
INTRAVENOUS | Status: DC
  Filled 2019-08-18 (×2): qty 250

## 2019-08-18 MED ORDER — SODIUM CHLORIDE 0.9 % IV SOLN
510.0000 mg | Freq: Once | INTRAVENOUS | Status: AC
  Administered 2019-08-18: 510 mg via INTRAVENOUS
  Filled 2019-08-18: qty 510

## 2019-08-18 NOTE — Patient Instructions (Signed)

## 2019-08-24 ENCOUNTER — Ambulatory Visit: Payer: Medicare Other | Admitting: Hematology

## 2019-08-24 ENCOUNTER — Ambulatory Visit: Payer: Medicare Other

## 2019-08-24 ENCOUNTER — Other Ambulatory Visit: Payer: Medicare Other

## 2019-08-26 ENCOUNTER — Encounter: Payer: Self-pay | Admitting: Certified Registered Nurse Anesthetist

## 2019-08-27 ENCOUNTER — Ambulatory Visit (AMBULATORY_SURGERY_CENTER): Payer: Medicare Other | Admitting: Internal Medicine

## 2019-08-27 ENCOUNTER — Other Ambulatory Visit: Payer: Self-pay | Admitting: Hematology

## 2019-08-27 ENCOUNTER — Other Ambulatory Visit: Payer: Self-pay

## 2019-08-27 ENCOUNTER — Encounter: Payer: Self-pay | Admitting: Internal Medicine

## 2019-08-27 VITALS — BP 181/73 | HR 77 | Temp 98.0°F | Resp 10 | Ht 65.0 in | Wt 206.0 lb

## 2019-08-27 DIAGNOSIS — K449 Diaphragmatic hernia without obstruction or gangrene: Secondary | ICD-10-CM | POA: Diagnosis not present

## 2019-08-27 DIAGNOSIS — K209 Esophagitis, unspecified without bleeding: Secondary | ICD-10-CM

## 2019-08-27 DIAGNOSIS — K208 Other esophagitis without bleeding: Secondary | ICD-10-CM | POA: Diagnosis not present

## 2019-08-27 DIAGNOSIS — R195 Other fecal abnormalities: Secondary | ICD-10-CM

## 2019-08-27 DIAGNOSIS — D509 Iron deficiency anemia, unspecified: Secondary | ICD-10-CM

## 2019-08-27 DIAGNOSIS — K2100 Gastro-esophageal reflux disease with esophagitis, without bleeding: Secondary | ICD-10-CM | POA: Diagnosis not present

## 2019-08-27 DIAGNOSIS — D649 Anemia, unspecified: Secondary | ICD-10-CM

## 2019-08-27 MED ORDER — SODIUM CHLORIDE 0.9 % IV SOLN: 500.0000 mL | Freq: Once | INTRAVENOUS | Status: DC

## 2019-08-27 MED ORDER — PANTOPRAZOLE SODIUM 40 MG PO TBEC
40.0000 mg | DELAYED_RELEASE_TABLET | Freq: Every day | ORAL | 3 refills | Status: DC
Start: 2019-08-27 — End: 2020-08-11

## 2019-08-27 NOTE — Patient Instructions (Addendum)
There was inflammation at the junction of the esophagus and stomach with some bleeding.  I took biopsies.  I am starting pantoprazole daily to treat this - sent to [facility] - please buy over the counter omeprazole and take 2 each day until the pantoprazole comes in the mail.  Further plans pending the biopsy results.  I appreciate the opportunity to care for you. Gatha Mayer, MD, FACG  YOU HAD AN ENDOSCOPIC PROCEDURE TODAY AT [facility]:   Refer to the procedure report that was given to you for any specific questions about what was found during the examination.  If the procedure report does not answer your questions, please call your gastroenterologist to clarify.  If you requested that your care partner not be given the details of your procedure findings, then the procedure report has been included in a sealed envelope for you to review at your convenience later.  YOU SHOULD EXPECT: Some feelings of bloating in the abdomen. Passage of more gas than usual.  Walking can help get rid of the air that was put into your GI tract during the procedure and reduce the bloating. If you had a lower endoscopy (such as a colonoscopy or flexible sigmoidoscopy) you may notice spotting of blood in your stool or on the toilet paper. If you underwent a bowel prep for your procedure, you may not have a normal bowel movement for a few days.  Please Note:  You might notice some irritation and congestion in your nose or some drainage.  This is from the oxygen used during your procedure.  There is no need for concern and it should clear up in a day or so.  SYMPTOMS TO REPORT IMMEDIATELY:   Following upper endoscopy (EGD)  Vomiting of blood or coffee ground material  New chest pain or pain under the shoulder blades  Painful or persistently difficult swallowing  New shortness of breath  Fever of 100F or higher  Black, tarry-looking stools  For urgent or emergent issues, a gastroenterologist  can be reached at any hour by calling [phone]. Do not use MyChart messaging for urgent concerns.    DIET:  We do recommend a small meal at first, but then you may proceed to your regular diet.  Drink plenty of fluids but you should avoid alcoholic beverages for 24 hours.  ACTIVITY:  You should plan to take it easy for the rest of today and you should NOT DRIVE or use heavy machinery until tomorrow (because of the sedation medicines used during the test).    FOLLOW UP: Our staff will call the number listed on your records 48-72 hours following your procedure to check on you and address any questions or concerns that you may have regarding the information given to you following your procedure. If we do not reach you, we will leave a message.  We will attempt to reach you two times.  During this call, we will ask if you have developed any symptoms of COVID 19. If you develop any symptoms (ie: fever, flu-like symptoms, shortness of breath, cough etc.) before then, please call [phone].  If you test positive for Covid 19 in the 2 weeks post procedure, please call and report this information to [place].    If any biopsies were taken you will be contacted by phone or by letter within the next 1-3 weeks.  Please call us at [phone] if you have not heard about the biopsies in 3 weeks.  SIGNATURES/CONFIDENTIALITY: You and/or your care partner have signed paperwork which will be entered into your electronic medical record.  These signatures attest to the fact that that the information above on your After Visit Summary has been reviewed and is understood.  Full responsibility of the confidentiality of this discharge information lies with you and/or your care-partner.

## 2019-08-27 NOTE — Progress Notes (Signed)
Report given to PACU, vss 

## 2019-08-27 NOTE — Progress Notes (Signed)
Called to room to assist during endoscopic procedure.  Patient ID and intended procedure confirmed with present staff. Received instructions for my participation in the procedure from the performing physician.  

## 2019-08-27 NOTE — Op Note (Signed)
[facility] Patient Name: [name] Procedure Date: 08/27/2019 10:20 AM MRN: [id number] Endoscopist: Gatha Mayer , MD Age: 74 Referring MD:  Date of Birth: August 28, 1945 Gender: Female Account #: [id number] Procedure:                Upper GI endoscopy Indications:              Iron deficiency anemia, Heme positive stool Medicines:                Propofol per Anesthesia, Monitored Anesthesia Care Procedure:                Pre-Anesthesia Assessment:                           - Prior to the procedure, a History and Physical                            was performed, and patient medications and                            allergies were reviewed. The patient's tolerance of                            previous anesthesia was also reviewed. The risks                            and benefits of the procedure and the sedation                            options and risks were discussed with the patient.                            All questions were answered, and informed consent                            was obtained. Prior Anticoagulants: The patient has                            taken no previous anticoagulant or antiplatelet                            agents. ASA Grade Assessment: III - A patient with                            severe systemic disease. After reviewing the risks                            and benefits, the patient was deemed in                            satisfactory condition to undergo the procedure.                           After obtaining informed consent, the endoscope was  passed under direct vision. Throughout the                            procedure, the patient's blood pressure, pulse, and                            oxygen saturations were monitored continuously. The                            Endoscope was introduced through the mouth, and                            advanced to the second part of duodenum. The upper                             GI endoscopy was accomplished without difficulty.                            The patient tolerated the procedure well. Scope In: Scope Out: Findings:                 LA Grade A (one or more mucosal breaks less than 5                            mm, not extending between tops of 2 mucosal folds)                            esophagitis with bleeding was found at the                            gastroesophageal junction. Biopsies were taken with                            a cold forceps for histology. Verification of                            patient identification for the specimen was done.                            Estimated blood loss was minimal.                           A 3 cm hiatal hernia was found. The hiatal                            narrowing was 40 cm from the incisors. The Z-line                            was 37 cm from the incisors.                           The exam was otherwise without abnormality.  The cardia and gastric fundus were normal on                            retroflexion. Complications:            No immediate complications. Estimated Blood Loss:     Estimated blood loss was minimal. Impression:               - LA Grade A reflux esophagitis with bleeding.                            Biopsied. Certainly could be sourcxe of heme +                            stool and contribute to anemia - will keep in mind                            other possibilities pending clinical course                           - 3 cm hiatal hernia. Sliding                           - The examination was otherwise normal. Recommendation:           - Patient has a contact number available for                            emergencies. The signs and symptoms of potential                            delayed complications were discussed with the                            patient. Return to normal activities tomorrow.                            Written  discharge instructions were provided to the                            patient.                           - Continue present medications.                           - GERD diet.                           - Continue present medications.                           - Use Protonix (pantoprazole) 40 mg PO daily.                           - Get OTC omeprazole 40 mg/day til mail order Rx  comes                           - Await pathology results. Gatha Mayer, MD 08/27/2019 10:56:40 AM This report has been signed electronically.

## 2019-08-27 NOTE — Progress Notes (Signed)
Pt's states no medical or surgical changes since previsit or office visit.  Temp- [place]

## 2019-08-28 ENCOUNTER — Other Ambulatory Visit: Payer: Self-pay

## 2019-08-28 ENCOUNTER — Inpatient Hospital Stay: Payer: Medicare Other

## 2019-08-28 VITALS — BP 164/72 | HR 71 | Temp 98.2°F | Resp 18

## 2019-08-28 DIAGNOSIS — D509 Iron deficiency anemia, unspecified: Secondary | ICD-10-CM

## 2019-08-28 LAB — CBC WITH DIFFERENTIAL (CANCER CENTER ONLY)
Abs Immature Granulocytes: 0.04 10*3/uL (ref 0.00–0.07)
Basophils Absolute: 0 10*3/uL (ref 0.0–0.1)
Basophils Relative: 1 %
Eosinophils Absolute: 0.1 10*3/uL (ref 0.0–0.5)
Eosinophils Relative: 1 %
HCT: 29.5 % — ABNORMAL LOW (ref 36.0–46.0)
Hemoglobin: 8.8 g/dL — ABNORMAL LOW (ref 12.0–15.0)
Immature Granulocytes: 1 %
Lymphocytes Relative: 21 %
Lymphs Abs: 1.5 10*3/uL (ref 0.7–4.0)
MCH: 27.5 pg (ref 26.0–34.0)
MCHC: 29.8 g/dL — ABNORMAL LOW (ref 30.0–36.0)
MCV: 92.2 fL (ref 80.0–100.0)
Monocytes Absolute: 0.6 10*3/uL (ref 0.1–1.0)
Monocytes Relative: 8 %
Neutro Abs: 5.2 10*3/uL (ref 1.7–7.7)
Neutrophils Relative %: 68 %
Platelet Count: 187 10*3/uL (ref 150–400)
RBC: 3.2 MIL/uL — ABNORMAL LOW (ref 3.87–5.11)
RDW: 18.6 % — ABNORMAL HIGH (ref 11.5–15.5)
WBC Count: 7.5 10*3/uL (ref 4.0–10.5)
nRBC: 0 % (ref 0.0–0.2)

## 2019-08-28 MED ORDER — EPOETIN ALFA-EPBX 40000 UNIT/ML IJ SOLN
INTRAMUSCULAR | Status: AC
  Filled 2019-08-28: qty 1

## 2019-08-28 MED ORDER — CYANOCOBALAMIN 1000 MCG/ML IJ SOLN
1000.0000 ug | Freq: Once | INTRAMUSCULAR | Status: AC
  Administered 2019-08-28: 1000 ug via INTRAMUSCULAR

## 2019-08-28 MED ORDER — EPOETIN ALFA-EPBX 40000 UNIT/ML IJ SOLN
40000.0000 [IU] | Freq: Once | INTRAMUSCULAR | Status: AC
  Administered 2019-08-28: 40000 [IU] via SUBCUTANEOUS

## 2019-08-28 NOTE — Patient Instructions (Signed)
Darbepoetin Alfa injection What is this medicine? DARBEPOETIN ALFA (dar be POE e tin AL fa) helps your body make more red blood cells. It is used to treat anemia caused by chronic kidney failure and chemotherapy. This medicine may be used for other purposes; ask your health care provider or pharmacist if you have questions. COMMON BRAND NAME(S): Aranesp What should I tell my health care provider before I take this medicine? They need to know if you have any of these conditions:  blood clotting disorders or history of blood clots  cancer patient not on chemotherapy  cystic fibrosis  heart disease, such as angina, heart failure, or a history of a heart attack  hemoglobin level of 12 g/dL or greater  high blood pressure  low levels of folate, iron, or vitamin B12  seizures  an unusual or allergic reaction to darbepoetin, erythropoietin, albumin, hamster proteins, latex, other medicines, foods, dyes, or preservatives  pregnant or trying to get pregnant  breast-feeding How should I use this medicine? This medicine is for injection into a vein or under the skin. It is usually given by a health care professional in a hospital or clinic setting. If you get this medicine at home, you will be taught how to prepare and give this medicine. Use exactly as directed. Take your medicine at regular intervals. Do not take your medicine more often than directed. It is important that you put your used needles and syringes in a special sharps container. Do not put them in a trash can. If you do not have a sharps container, call your pharmacist or healthcare provider to get one. A special MedGuide will be given to you by the pharmacist with each prescription and refill. Be sure to read this information carefully each time. Talk to your pediatrician regarding the use of this medicine in children. While this medicine may be used in children as young as 1 month of age for selected conditions, precautions do  apply. Overdosage: If you think you have taken too much of this medicine contact a poison control center or emergency room at once. NOTE: This medicine is only for you. Do not share this medicine with others. What if I miss a dose? If you miss a dose, take it as soon as you can. If it is almost time for your next dose, take only that dose. Do not take double or extra doses. What may interact with this medicine? Do not take this medicine with any of the following medications:  epoetin alfa This list may not describe all possible interactions. Give your health care provider a list of all the medicines, herbs, non-prescription drugs, or dietary supplements you use. Also tell them if you smoke, drink alcohol, or use illegal drugs. Some items may interact with your medicine. What should I watch for while using this medicine? Your condition will be monitored carefully while you are receiving this medicine. You may need blood work done while you are taking this medicine. This medicine may cause a decrease in vitamin B6. You should make sure that you get enough vitamin B6 while you are taking this medicine. Discuss the foods you eat and the vitamins you take with your health care professional. What side effects may I notice from receiving this medicine? Side effects that you should report to your doctor or health care professional as soon as possible:  allergic reactions like skin rash, itching or hives, swelling of the face, lips, or tongue  breathing problems  changes in   vision  chest pain  confusion, trouble speaking or understanding  feeling faint or lightheaded, falls  high blood pressure  muscle aches or pains  pain, swelling, warmth in the leg  rapid weight gain  severe headaches  sudden numbness or weakness of the face, arm or leg  trouble walking, dizziness, loss of balance or coordination  seizures (convulsions)  swelling of the ankles, feet, hands  unusually weak or  tired Side effects that usually do not require medical attention (report to your doctor or health care professional if they continue or are bothersome):  diarrhea  fever, chills (flu-like symptoms)  headaches  nausea, vomiting  redness, stinging, or swelling at site where injected This list may not describe all possible side effects. Call your doctor for medical advice about side effects. You may report side effects to FDA at 1-800-FDA-1088. Where should I keep my medicine? Keep out of the reach of children. Store in a refrigerator between 2 and 8 degrees C (36 and 46 degrees F). Do not freeze. Do not shake. Throw away any unused portion if using a single-dose vial. Throw away any unused medicine after the expiration date. NOTE: This sheet is a summary. It may not cover all possible information. If you have questions about this medicine, talk to your doctor, pharmacist, or health care provider.  2020 Elsevier/Gold Standard (2017-03-20 16:44:20) Cyanocobalamin, Vitamin B12 injection What is this medicine? CYANOCOBALAMIN (sye an oh koe BAL a min) is a man made form of vitamin B12. Vitamin B12 is used in the growth of healthy blood cells, nerve cells, and proteins in the body. It also helps with the metabolism of fats and carbohydrates. This medicine is used to treat people who can not absorb vitamin B12. This medicine may be used for other purposes; ask your health care provider or pharmacist if you have questions. COMMON BRAND NAME(S): B-12 Compliance Kit, B-12 Injection Kit, Cyomin, LA-12, Nutri-Twelve, Physicians EZ Use B-12, Primabalt What should I tell my health care provider before I take this medicine? They need to know if you have any of these conditions:  kidney disease  Leber's disease  megaloblastic anemia  an unusual or allergic reaction to cyanocobalamin, cobalt, other medicines, foods, dyes, or preservatives  pregnant or trying to get pregnant  breast-feeding How  should I use this medicine? This medicine is injected into a muscle or deeply under the skin. It is usually given by a health care professional in a clinic or doctor's office. However, your doctor may teach you how to inject yourself. Follow all instructions. Talk to your pediatrician regarding the use of this medicine in children. Special care may be needed. Overdosage: If you think you have taken too much of this medicine contact a poison control center or emergency room at once. NOTE: This medicine is only for you. Do not share this medicine with others. What if I miss a dose? If you are given your dose at a clinic or doctor's office, call to reschedule your appointment. If you give your own injections and you miss a dose, take it as soon as you can. If it is almost time for your next dose, take only that dose. Do not take double or extra doses. What may interact with this medicine?  colchicine  heavy alcohol intake This list may not describe all possible interactions. Give your health care provider a list of all the medicines, herbs, non-prescription drugs, or dietary supplements you use. Also tell them if you smoke, drink alcohol,  or use illegal drugs. Some items may interact with your medicine. What should I watch for while using this medicine? Visit your doctor or health care professional regularly. You may need blood work done while you are taking this medicine. You may need to follow a special diet. Talk to your doctor. Limit your alcohol intake and avoid smoking to get the best benefit. What side effects may I notice from receiving this medicine? Side effects that you should report to your doctor or health care professional as soon as possible:  allergic reactions like skin rash, itching or hives, swelling of the face, lips, or tongue  blue tint to skin  chest tightness, pain  difficulty breathing, wheezing  dizziness  red, swollen painful area on the leg Side effects that  usually do not require medical attention (report to your doctor or health care professional if they continue or are bothersome):  diarrhea  headache This list may not describe all possible side effects. Call your doctor for medical advice about side effects. You may report side effects to FDA at 1-800-FDA-1088. Where should I keep my medicine? Keep out of the reach of children. Store at room temperature between 15 and 30 degrees C (59 and 85 degrees F). Protect from light. Throw away any unused medicine after the expiration date. NOTE: This sheet is a summary. It may not cover all possible information. If you have questions about this medicine, talk to your doctor, pharmacist, or health care provider.  2020 Elsevier/Gold Standard (2007-06-16 22:10:20)

## 2019-08-31 ENCOUNTER — Telehealth: Payer: Self-pay

## 2019-08-31 NOTE — Telephone Encounter (Signed)
Covid-19 screening questions   Do you now or have you had a fever in the last 14 days? No.  Do you have any respiratory symptoms of shortness of breath or cough now or in the last 14 days? No. Do you have any family members or close contacts with diagnosed or suspected Covid-19 in the past 14 days? No.  Have you been tested for Covid-19 and found to be positive? No.       Follow up Call-  Call back number 08/27/2019 05/05/2018  Post procedure Call Back phone  # [phone]  Permission to leave phone message Yes Yes  Some recent data might be hidden     Patient questions:  Do you have a fever, pain , or abdominal swelling? No. Pain Score  0 *  Have you tolerated food without any problems? Yes.    Have you been able to return to your normal activities? Yes.    Do you have any questions about your discharge instructions: Diet   No. Medications  No. Follow up visit  No.  Do you have questions or concerns about your Care? No.  Actions: * If pain score is 4 or above: No action needed, pain <4.

## 2019-09-01 NOTE — Progress Notes (Signed)
Cardiology Office Note   Date:  09/07/2019   ID:  [name], DOB 08-19-1945, MRN [id number]  PCP:  Seward Carol, MD  Cardiologist:  Dr. Johnsie Cancel     No chief complaint on file.     History of Present Illness: [name] is a 74 y.o. female who presents for follow up of DOE and angina Hisotry ofCAD with pevious rotoblator to the LAD in 2000. Myovue on 11/2006 showed mild apical thinning and was low risk.   May 2019 admitted with sepsis syndrome pneumonia She had alveolar hemorrhage and brief PEA Arrest post bronchoscopy and was intubated for days and d/c to inpatient rehab   Breast cancer and sees Dr Burr Medico was on Arimidex but hospitalized in July 2019  with SBO pneumonia and pancreatic cyst. Noted to have renal vein thrombosis and placed on xarelto  For 6 months stopped 01/2018   TTE 09/04/18 EF 60-65% Strain -19.6 No significant valve dx  Had more dyspnea end of may. Hct found to be 23.2 Colonoscopy 04/2018 ok BMB 09/09/18 no evidence of MDS Has had feraheme, B12 and Retacrit as well as oral iron EGD done by Dr Carlean Purl 08/27/19 showed esophagitis With bleeding She is on protonix  Feeling better no cardiac issues Getting shots for anemia q 2 weeks     Past Medical History:  Diagnosis Date  . Abdominal discomfort    Chronic N/V/D. Presumptive dx Crohn's dx per elevated p ANCA. Failed Entocort and Pentasa. Sep 2003 - ileocolectomy c anastomosis per Dr Deon Pilling 2/2 adhesions - path was hegative for Crohns. EGD, Sm bowel follow through (11/03), and an eteroclysis (10/03) were unrevealing. Cuases hypomag and hypocalcemia.  . Adnexal mass 8/03   s/p lap BSO (R ovarian fibroma) & lysis of adhesions  . Allergy    Seasonal  . Anemia    Multifactorial. Baseline HgB 10-11 ish. B12 def - 150 in 3/10. Fe Def - ferritin 35 3/10. Both are being repleted.  . Breast cancer ([facility]) 03/16/13   right, 5 o'clock  . CAD (coronary artery disease) 1996   1996 - PTCA and  angioplasty diagonal branch. 2000 - Rotoblator & angiopllasty of diagonal. 2006 - subendocardial AMI, DES to proximal LAD.Marland Kitchen Also had 90% stenosis in distal apical LAD. EF 55 with apical hypokinesis. Indefinite ASA and Plavix.  . CHF (congestive heart failure) ([place])   . Chronic kidney disease    Chronic renal insuff baseline Cr 1.2 - 1.4 ish.  . Chronic pain    CT 10/10 = Spinal stenosis L2 - S1.  . Diabetes mellitus    Insulin dependent  . Hx of radiation therapy 06/02/13- 07/16/13   right rbeast 4500 cGy 25 sessions, right breast boost 1600 cGy in 8 sessions  . Hyperlipidemia    Managed with both a statin and Welchol. Welchol stopped 2014 2/2 cost and started on fenofibrate   . Hypertension    2006 B renal arteries patent. 2003 MRA - no RAS. 2003 pheo W/U Dr Hassell Done reportedly negative.  . Hypoxia 07/23/2017  . Lupus ([place])   . Personal history of radiation therapy 2015  . RBBB   . SBO (small bowel obstruction) ([place]) 09/17/2017  . Stroke [facility])    Incidental finding MRI 2002 L lacunar infarct  . Wears dentures    top    Past Surgical History:  Procedure Laterality Date  . ABDOMINAL HYSTERECTOMY    . BILATERAL SALPINGOOPHORECTOMY  8/03   Lap BSO (R ovarian fibroma) and  adhesion lysis  . BOWEL RESECTION  2003   ileocolectomy with anastomosis 2/2 adhesions  . BREAST BIOPSY Right 2015  . BREAST BIOPSY Right 07/2014  . BREAST LUMPECTOMY Right 04/22/2013  . BREAST LUMPECTOMY WITH NEEDLE LOCALIZATION AND AXILLARY SENTINEL LYMPH NODE BX Right 04/22/2013   Procedure: BREAST LUMPECTOMY WITH NEEDLE LOCALIZATION AND AXILLARY SENTINEL LYMPH NODE BX;  Surgeon: Stark Klein, MD;  Location: [facility];  Service: General;  Laterality: Right;  . CARDIAC CATHETERIZATION     2 stents  . CHOLECYSTECTOMY    . COLONOSCOPY    . HEMICOLECTOMY     R sided hemicolectomy  . HERNIA REPAIR     Ventral hernia repair  . PTCA  4/06     Current Outpatient Medications  Medication Sig Dispense  Refill  . acetaminophen (TYLENOL) 325 MG tablet Take 650 mg by mouth every 6 (six) hours as needed for mild pain.    Marland Kitchen amLODipine (NORVASC) 10 MG tablet Take 1 tablet (10 mg total) by mouth daily. 30 tablet 0  . atorvastatin (LIPITOR) 40 MG tablet Take 40 mg by mouth daily.    . budesonide (ENTOCORT EC) 3 MG 24 hr capsule Take 1 capsule (3 mg total) by mouth daily. 90 capsule 0  . carvedilol (COREG) 25 MG tablet Take 25 mg by mouth 2 (two) times daily.    . colestipol (COLESTID) 1 g tablet Take 2 g by mouth at bedtime.    Marland Kitchen desloratadine (CLARINEX) 5 MG tablet Take 5 mg by mouth at bedtime as needed (allergies). Reported on 04/11/2015    . ferrous fumarate (HEMOCYTE - 106 MG FE) 325 (106 Fe) MG TABS tablet Take 1 tablet by mouth as directed.    . gabapentin (NEURONTIN) 300 MG capsule Take 1 capsule by mouth at bedtime 30 capsule 0  . hydrALAZINE (APRESOLINE) 100 MG tablet Take 1 tablet (100 mg total) by mouth 3 (three) times daily. 270 tablet 1  . hydrOXYzine (ATARAX/VISTARIL) 50 MG tablet Take 50 mg by mouth 2 (two) times daily as needed.    . isosorbide mononitrate (IMDUR) 30 MG 24 hr tablet Take 1/2 (one-half) tablet by mouth once daily 45 tablet 6  . LANTUS SOLOSTAR 100 UNIT/ML Solostar Pen SMARTSIG:14 Unit(s) SUB-Q Every Night    . linagliptin (TRADJENTA) 5 MG TABS tablet Take 5 mg by mouth daily.    . Melatonin 5 MG TABS Take 10 mg by mouth at bedtime.    . nitroGLYCERIN (NITROSTAT) 0.4 MG SL tablet DISSOLVE 1 TABLET UNDER THE TONGUE EVERY 5 MINUTES AS  NEEDED FOR CHEST PAIN. MAX  OF 3 TABLETS IN 15 MINUTES. CALL 911 IF PAIN PERSISTS. 100 tablet 3  . pantoprazole (PROTONIX) 40 MG tablet Take 1 tablet (40 mg total) by mouth daily before breakfast. 90 tablet 3  . torsemide (DEMADEX) 20 MG tablet Take 20 mg by mouth daily.    Marland Kitchen ULORIC 40 MG tablet Take 1 tablet by mouth daily.    . Vitamin D, Ergocalciferol, (DRISDOL) 1.25 MG (50000 UNIT) CAPS capsule Take 1 capsule (50,000 Units total) by  mouth every 7 (seven) days. 8 capsule 0   No current facility-administered medications for this visit.    Allergies:   Percocet [oxycodone-acetaminophen]; Diazepam; Haloperidol lactate; Lorazepam; Morphine sulfate; Propoxyphene hcl; Tramadol hcl; Anesthetics, halogenated; Etomidate; Fentanyl; and Versed [midazolam]    Social History:  The patient  reports that she has never smoked. She has never used smokeless tobacco. She reports that she does  not drink alcohol and does not use drugs.   Family History:  The patient's family history includes Breast cancer in her maternal aunt and maternal grandmother; Breast cancer (age of onset: 26) in her mother; Clotting disorder in her maternal grandmother; Colon cancer in her maternal grandmother; Diabetes in her maternal grandmother; Heart attack in her maternal grandmother; Hypertension in her daughter; Kidney failure in her maternal aunt; Lung cancer in her maternal aunt; Pancreatic cancer (age of onset: 19) in her brother; Prostate cancer in her maternal uncle.    ROS:  General:no colds or fevers,  Weight down 2 lbs. From last visit.  Skin:no rashes or ulcers HEENT:no blurred vision, no congestion CV:see HPI PUL:see HPI GI:no diarrhea constipation or melena, no indigestion GU:no hematuria, no dysuria MS:no joint pain, no claudication Neuro:no syncope, no lightheadedness Endo:+ diabetes, no thyroid disease  Wt Readings from Last 3 Encounters:  09/07/19 186 lb (84.4 kg)  08/27/19 206 lb (93.4 kg)  08/13/19 201 lb 1.6 oz (91.2 kg)     PHYSICAL EXAM: VS:  BP 140/64   Pulse 76   Ht 5' 5"  (1.651 m)   Wt 186 lb (84.4 kg)   SpO2 98%   BMI 30.95 kg/m  , BMI Body mass index is 30.95 kg/m.  Affect appropriate Chronically ill overweight black female  HEENT: normal Neck supple with no adenopathy JVP normal no bruits no thyromegaly Lungs clear with no wheezing and good diaphragmatic motion Heart:  S1/S2 no murmur, no rub, gallop or click PMI  normal Abdomen: benighn, BS positve, no tenderness, no AAA no bruit.  No HSM or HJR Distal pulses intact with no bruits No edema Neuro non-focal Skin warm and dry No muscular weakness    EKG:   SR rate 79 RBBB 08/22/18   Recent Labs: 05/23/2019: B Natriuretic Peptide 74.1 07/21/2019: ALT 31; BUN 33; Creatinine 1.62; Potassium 3.8; Sodium 144; TSH 3.203 08/28/2019: Hemoglobin 8.8; Platelet Count 187    Lipid Panel    Component Value Date/Time   CHOL 183 07/21/2015 1106   TRIG 107 07/24/2017 1444   HDL 47 07/21/2015 1106   CHOLHDL 3.9 07/21/2015 1106   CHOLHDL 3.8 09/10/2013 1041   VLDL 56 (H) 09/10/2013 1041   LDLCALC 88 07/21/2015 1106   LDLDIRECT 55 07/01/2012 1016       Other studies Reviewed: Additional studies/ records that were reviewed today include: . Echo 09/04/18   IMPRESSIONS   1. The left ventricle has normal systolic function with an ejection fraction of 60-65%. The cavity size was normal. There is moderate asymmetric left ventricular hypertrophy. Left ventricular diastolic Doppler parameters are consistent with impaired  relaxation. Indeterminate filling pressures The E/e' is 8-15. No evidence of left ventricular regional wall motion abnormalities. 2. The right ventricle has normal systolic function. The cavity was normal. There is no increase in right ventricular wall thickness. 3. The mitral valve is abnormal. Mild thickening of the mitral valve leaflet. 4. The tricuspid valve is grossly normal. 5. The aortic valve is grossly normal. No stenosis of the aortic valve. 6. The average left ventricular global longitudinal strain is -19.6 %.  SUMMARY  LVEF 60-65%, moderate assymetric septal hypertrophy, grade 1 DD, indeterminate LV filling pressure, normal GLS at -20%, trivial MR, normal LA size, trivial TR, normal RVSP, normal IVC FINDINGS Left Ventricle: The left ventricle has normal systolic function, with an ejection fraction of 60-65%. The  cavity size was normal. There is moderate asymmetric left ventricular hypertrophy. Left ventricular diastolic  Doppler parameters are consistent  with impaired relaxation. Indeterminate filling pressures The E/e' is 8-15. No evidence of left ventricular regional wall motion abnormalities.. The average left ventricular global longitudinal strain is -19.6 %.   Nuc study 02/27/18 Addendum by Sueanne Margarita, MD on Sun Mar 02, 2018 7:58 PM   The left ventricular ejection fraction is moderately decreased (30-44%).  Nuclear stress EF: 37%.  There was no ST segment deviation noted during stress.  Study is of very poor quality due to evere extracardiac tracer uptake  Recommend repeat stress images or consider coronary CTA   Finalized by Sueanne Margarita, MD on Sun Mar 02, 2018 7:57 PM   The left ventricular ejection fraction is moderately decreased (30-44%).  Nuclear stress EF: 37%.  There was no ST segment deviation noted during stress.     ASSESSMENT AND PLAN:  1. Angina resolved on imdur defer cath due to CRF low risk myovue 02/27/18  2. DOE: EF normal by echo 09/04/18  Lungs clear today  Typically more related to anemia 3.  Diastolic HF chronic stable.  Her anemia plays a role but with IRON and arenesp this may improve.  4.  Anemia followed by oncology/GI multifactorial due to GI bleed, CRF  5. CRF:  F/u Hollie Salk  On demedex CR 1.6 07/21/19     Current medicines are reviewed with the patient today.  The patient Has no concerns regarding medicines.  The following changes have been made:  See above Labs/ tests ordered today include:see above  Disposition:   FU:  6 months   Signed, [name], MD  09/07/2019 11:10 AM    [facility] [place], [place], [place], [place] Phone: [phone]; Fax: [phone]

## 2019-09-02 ENCOUNTER — Other Ambulatory Visit: Payer: Self-pay

## 2019-09-02 MED ORDER — BUDESONIDE 3 MG PO CPEP: 3.0000 mg | ORAL_CAPSULE | Freq: Every day | ORAL | 0 refills | Status: DC

## 2019-09-02 NOTE — Telephone Encounter (Signed)
Budesonide refilled as pharmacy requested.

## 2019-09-02 NOTE — Progress Notes (Signed)
[name]   [phone] [phone]   Clinic Follow up Note   Patient Care Team: Seward Carol, MD as PCP - General (Internal Medicine) Josue Hector, MD as PCP - Cardiology (Cardiology) Thelma Comp, [place] as Consulting Physician (Optometry) Stark Klein, MD as Consulting Physician (General Surgery) Truitt Merle, MD as Consulting Physician (Hematology) Madelon Lips, MD as Consulting Physician (Nephrology)  Date of Service:  09/14/2019  CHIEF COMPLAINT: Followup of rightbreast cancer and anemia of chronic disease  SUMMARY OF ONCOLOGIC HISTORY: Oncology History Overview Note  Cancer Staging Breast cancer of lower-inner quadrant of right female breast [facility]) Staging form: Breast, AJCC 7th Edition - Clinical: Stage IA (T1b, N0, cM0) - Unsigned Staging comments: Staged at breast conference 03/25/13.  - Pathologic stage from 05/12/2013: Stage IA (T1b, N0, cM0) - Signed by Truitt Merle, MD on 06/21/2016     Breast cancer of lower-inner quadrant of right female breast ([place])  02/27/2013 Mammogram   Diagnostic mammogram and ultrasound showed a 6 x 5 mm mass at 5:00 position. Axilla was negative by ultrasound.   03/16/2013 Initial Biopsy   Right breast 5:00 position biopsy showed invasive ductal carcinoma, low-grade   03/16/2013 Receptors her2   ER100% positive, PR 100% positive, HER-2 negative, Ki-67 20%   03/18/2013 Initial Diagnosis   Breast cancer of lower-inner quadrant of right female breast ([place])   05/12/2013 Surgery   Right breast lumpectomy with sentinel lymph node biopsy   05/12/2013 Pathology Results   Right breast lumpectomy showed invasive ductal carcinoma, grade 2, 0.9 cm, 6 sentinel lymph nodes were all negative. Margins were negative.   05/12/2013 Oncotype testing   Oncotype recurrence score 19, intermediate risk, 10 year risk of distant recurrence 12% with tamoxifen for 5 years. Patient declined adjuvant chemotherapy.     06/02/2013 - 07/16/2013 Radiation Therapy   Adjuvant breast radiation with boost 1600Gy   08/06/2013 - 07/2017 Anti-estrogen oral therapy   Arimidex 1 mg daily. Stopped in 07/2017 due to acute illness    03/22/2017 Mammogram   IMPRESSION: No mammographic evidence for malignancy.      CURRENT THERAPY:  -Retacritinjection starting 09/2018, currently q2weeks. Increased to 20K units q2weeks starting 06/29/19. 40K unit every 2 weeks on 08/13/19 -IV Feraheme as needed starting 09/15/18. Last on 08/10/19 -Oral Ferrous sulfate daily -B12 injection monthly starting 05/04/19. Increased to every 2 weeks on 06/29/19.  INTERVAL HISTORY:  [name] is here for a follow up of anemia and h/o breast cancer. She presents to the clinic alone. She notes she is doing well. With notes improved LE edema since changing her medication with her nephrologist. She notes she still has fatigue. She is able to do her usual activities but slower. She notes mild SOB with exertion. She denies chest pain from her heart disease.    REVIEW OF SYSTEMS:   Constitutional: Denies fevers, chills or abnormal weight loss (+) Fatigue  Eyes: Denies blurriness of vision Ears, nose, mouth, throat, and face: Denies mucositis or sore throat Respiratory: Denies cough or wheezes (+) Mild SOB with exertion Cardiovascular: Denies palpitation, chest discomfort or lower extremity swelling Gastrointestinal:  Denies nausea, heartburn or change in bowel habits Skin: Denies abnormal skin rashes Lymphatics: Denies new lymphadenopathy or easy bruising Neurological:Denies numbness, tingling or new weaknesses Behavioral/Psych: Mood is stable, no new changes  All other systems were reviewed with the patient and are negative.  MEDICAL HISTORY:  Past Medical History:  Diagnosis Date  . Abdominal discomfort  Chronic N/V/D. Presumptive dx Crohn's dx per elevated p ANCA. Failed Entocort and Pentasa. Sep 2003 - ileocolectomy c anastomosis per  Dr Deon Pilling 2/2 adhesions - path was hegative for Crohns. EGD, Sm bowel follow through (11/03), and an eteroclysis (10/03) were unrevealing. Cuases hypomag and hypocalcemia.  . Adnexal mass 8/03   s/p lap BSO (R ovarian fibroma) & lysis of adhesions  . Allergy    Seasonal  . Anemia    Multifactorial. Baseline HgB 10-11 ish. B12 def - 150 in 3/10. Fe Def - ferritin 35 3/10. Both are being repleted.  . Breast cancer ([place]) 03/16/13   right, 5 o'clock  . CAD (coronary artery disease) 1996   1996 - PTCA and angioplasty diagonal branch. 2000 - Rotoblator & angiopllasty of diagonal. 2006 - subendocardial AMI, DES to proximal LAD.Marland Kitchen Also had 90% stenosis in distal apical LAD. EF 55 with apical hypokinesis. Indefinite ASA and Plavix.  . CHF (congestive heart failure) ([place])   . Chronic kidney disease    Chronic renal insuff baseline Cr 1.2 - 1.4 ish.  . Chronic pain    CT 10/10 = Spinal stenosis L2 - S1.  . Diabetes mellitus    Insulin dependent  . Gout   . Hx of radiation therapy 06/02/13- 07/16/13   right rbeast 4500 cGy 25 sessions, right breast boost 1600 cGy in 8 sessions  . Hyperlipidemia    Managed with both a statin and Welchol. Welchol stopped 2014 2/2 cost and started on fenofibrate   . Hypertension    2006 B renal arteries patent. 2003 MRA - no RAS. 2003 pheo W/U Dr Hassell Done reportedly negative.  . Hypoxia 07/23/2017  . Lupus ([place])   . Lymphedema of breast   . Personal history of radiation therapy 2015  . RBBB   . Renal vein thrombosis ([place])   . SBO (small bowel obstruction) ([place]) 09/17/2017  . Secondary hyperparathyroidism ([place])   . Stroke [facility])    Incidental finding MRI 2002 L lacunar infarct  . Vitamin B12 deficiency   . Vitamin D deficiency   . Wears dentures    top    SURGICAL HISTORY: Past Surgical History:  Procedure Laterality Date  . ABDOMINAL HYSTERECTOMY    . BILATERAL SALPINGOOPHORECTOMY  8/03   Lap BSO (R ovarian fibroma) and adhesion lysis  . BOWEL RESECTION  2003    ileocolectomy with anastomosis 2/2 adhesions  . BREAST BIOPSY Right 2015  . BREAST BIOPSY Right 07/2014  . BREAST LUMPECTOMY Right 04/22/2013  . BREAST LUMPECTOMY WITH NEEDLE LOCALIZATION AND AXILLARY SENTINEL LYMPH NODE BX Right 04/22/2013   Procedure: BREAST LUMPECTOMY WITH NEEDLE LOCALIZATION AND AXILLARY SENTINEL LYMPH NODE BX;  Surgeon: Stark Klein, MD;  Location: [place];  Service: General;  Laterality: Right;  . CARDIAC CATHETERIZATION     2 stents  . CHOLECYSTECTOMY    . COLONOSCOPY    . HEMICOLECTOMY     R sided hemicolectomy  . HERNIA REPAIR     Ventral hernia repair  . PTCA  4/06    I have reviewed the social history and family history with the patient and they are unchanged from previous note.  ALLERGIES:  is allergic to percocet [oxycodone-acetaminophen]; diazepam; haloperidol lactate; lorazepam; morphine sulfate; propoxyphene hcl; tramadol hcl; anesthetics, halogenated; etomidate; fentanyl; and versed [midazolam].  MEDICATIONS:  Current Outpatient Medications  Medication Sig Dispense Refill  . acetaminophen (TYLENOL) 325 MG tablet Take 650 mg by mouth every 6 (six) hours as needed for mild pain.    Marland Kitchen  amLODipine (NORVASC) 10 MG tablet Take 1 tablet (10 mg total) by mouth daily. 30 tablet 0  . atorvastatin (LIPITOR) 40 MG tablet Take 40 mg by mouth daily.    . budesonide (ENTOCORT EC) 3 MG 24 hr capsule Take 1 capsule (3 mg total) by mouth daily. 90 capsule 0  . carvedilol (COREG) 25 MG tablet Take 25 mg by mouth 2 (two) times daily.    . colestipol (COLESTID) 1 g tablet Take 2 g by mouth at bedtime.    Marland Kitchen desloratadine (CLARINEX) 5 MG tablet Take 5 mg by mouth at bedtime as needed (allergies). Reported on 04/11/2015    . ferrous fumarate (HEMOCYTE - 106 MG FE) 325 (106 Fe) MG TABS tablet Take 1 tablet by mouth as directed.    . gabapentin (NEURONTIN) 300 MG capsule Take 1 capsule by mouth at bedtime 30 capsule 0  . hydrALAZINE (APRESOLINE) 100 MG tablet  Take 1 tablet (100 mg total) by mouth 3 (three) times daily. 270 tablet 1  . hydrOXYzine (ATARAX/VISTARIL) 50 MG tablet Take 50 mg by mouth 2 (two) times daily as needed.    . isosorbide mononitrate (IMDUR) 30 MG 24 hr tablet Take 1/2 (one-half) tablet by mouth once daily 45 tablet 6  . LANTUS SOLOSTAR 100 UNIT/ML Solostar Pen SMARTSIG:14 Unit(s) SUB-Q Every Night    . linagliptin (TRADJENTA) 5 MG TABS tablet Take 5 mg by mouth daily.    . Melatonin 5 MG TABS Take 10 mg by mouth at bedtime.    . nitroGLYCERIN (NITROSTAT) 0.4 MG SL tablet DISSOLVE 1 TABLET UNDER THE TONGUE EVERY 5 MINUTES AS  NEEDED FOR CHEST PAIN. MAX  OF 3 TABLETS IN 15 MINUTES. CALL 911 IF PAIN PERSISTS. 100 tablet 3  . pantoprazole (PROTONIX) 40 MG tablet Take 1 tablet (40 mg total) by mouth daily before breakfast. 90 tablet 3  . torsemide (DEMADEX) 20 MG tablet Take 20 mg by mouth daily.    Marland Kitchen ULORIC 40 MG tablet Take 1 tablet by mouth daily.    . Vitamin D, Ergocalciferol, (DRISDOL) 1.25 MG (50000 UNIT) CAPS capsule Take 1 capsule (50,000 Units total) by mouth every 7 (seven) days. 8 capsule 0   No current facility-administered medications for this visit.    PHYSICAL EXAMINATION: ECOG PERFORMANCE STATUS: 1 - Symptomatic but completely ambulatory  Vitals:   09/14/19 0813  BP: (!) 125/50  Pulse: 74  Resp: 18  Temp: 98.6 F (37 C)  SpO2: 99%   Filed Weights   09/14/19 0813  Weight: 192 lb 3.2 oz (87.2 kg)    Due to COVID19 we will limit examination to appearance. Patient had no complaints.  GENERAL:alert, no distress and comfortable SKIN: skin color normal, no rashes or significant lesions EYES: normal, Conjunctiva are pink and non-injected, sclera clear  NEURO: alert & oriented x 3 with fluent speech    LABORATORY DATA:  I have reviewed the data as listed CBC Latest Ref Rng & Units 09/14/2019 08/28/2019 08/10/2019  WBC 4.0 - 10.5 K/uL 9.1 7.5 8.2  Hemoglobin 12.0 - 15.0 g/dL 8.3(L) 8.8(L) 7.1(L)  Hematocrit  36 - 46 % 27.3(L) 29.5(L) 23.2(L)  Platelets 150 - 400 K/uL 194 187 214     CMP Latest Ref Rng & Units 07/21/2019 06/29/2019 05/23/2019  Glucose 70 - 99 mg/dL 152(H) 122(H) -  BUN 8 - 23 mg/dL 33(H) 68(H) -  Creatinine 0.44 - 1.00 mg/dL 1.62(H) 2.55(H) -  Sodium 135 - 145 mmol/L 144 143 143  Potassium 3.5 - 5.1 mmol/L 3.8 4.1 4.3  Chloride 98 - 111 mmol/L 114(H) 104 -  CO2 22 - 32 mmol/L 21(L) 31 -  Calcium 8.9 - 10.3 mg/dL 8.4(L) 8.6(L) -  Total Protein 6.5 - 8.1 g/dL 6.3(L) 7.0 -  Total Bilirubin 0.3 - 1.2 mg/dL 0.3 0.3 -  Alkaline Phos 38 - 126 U/L 92 81 -  AST 15 - 41 U/L 15 17 -  ALT 0 - 44 U/L 31 30 -      RADIOGRAPHIC STUDIES: I have personally reviewed the radiological images as listed and agreed with the findings in the report. No results found.   ASSESSMENT & PLAN:  Mackinze [name] [name] is a 74 y.o. female with    1. Anemia of chronic disease (CKDstage IV), Ironand B12deficiencyand GI bleeding  -Her 04/2018 Colonoscopy with Dr Carlean Purl showed no evidence of GI bleedingor source of bleeding.Heranemia is probably related to her CKD, however her anemia is out of proportion of her stage IV CKD related anemia. -Her6/23/20 bone marrow biopsyresults showed hypercellular marrow, no evidence of MDS or other concerns.Her Cytogenetics is still pending. -She has been receiving IV iron as needed -Patient previously received B12 shot every month at PCP's office. Started receiving monthly B12 injections in our clinic on 05/04/19. Was increased to q2weeks on 06/29/19.  -Herinsurancedid not approve Aranesp so shewas switched toRetacrit every 2 weekson 11/14/18. She is tolerating well. Goal is Hg9-11.Due to worsened anemia, her dose has been increased, currently at 40,000 every 2 weeks.  If results still inadequate, will change to weekly. -She continues to have blood transfusion as needed, last given 08/20/19.  -Continue OTC iron pill ferrous sulfate 1-2 tab dailyfor  irondeficiency -I reviewed her 08/2019 Endoscopy by Dr Leroy Kennedy which showed esophagitis with bleeding. She was started on Prilosec  -Her today's labs show Hg 8.3. Ferritin still pending. If ferritin low will give IV Iron today. Proceed with Retacrit today.  -will monitor with labs every 2 weeks. Will give IV iron if ferritin less than 100. Continue Retacrit every 2 weeks. If she needs more frequent blood transfusion will increase Retacrit to weekly. She voiced good understanding.  -F/u in 2 months   2.Breast cancer of the lower inner quadrant of the right breast, invasive ductal carcinoma with DCIS, pT1bN0Mo, stage IA, ER+ and PR +, HER 2 negative -She was diagnosed in 02/2013 and treated with right lumpectomy, adjuvant radiation andAnastrozolefor 4 years until5/2019.Anastrozole was stopped after she developed septic shock from pneumonia and a cardiac arrest,and we decided not restarted due to the risk of cardiac toxicity. -From a breast cancer standpoint she is clinically doing well and she has no concerns. Her 03/2019 mammogram was normal. -She isover6years out since diagnosis. Continue breast cancer surveillance, Next mammogram in 03/2020  3. DM, HTN, CAD -managedby PCPand Dr. Johnsie Cancel the cardiologist. -Currently on Lantis, tradjentrafor DM  4.Chronic renal insufficieny, CKD stageIV -She follows up with Dr.Uptonfor herkidney function.  -She knows to avoid nephrotoxins  5. Low Backpain, from degenerative arthritis and spinal stenosis - f/u with Orthopedics -She is currently getting injections with orthopedist  6. Genetics results negative  7. Bone Health  -DEXA from 04/2018 shows slightly improved normal bone density with lowest T-score of 0.9 at forearm radius. -Her VitD level is low. She will continue 1000u daily.  8. Left Renal Vein Thrombosisin 07/2017 -Likely provoked from acute illnessand prior SBO -She stopped Xarelto in 01/2018 -Her  hypercoagulopathy work-up in November 2019 was negative  9.  History of pneumoniaand cardiac arrestin 07/2017 -10/16/17 CXR showed residual peribronchial disease,atelectasis, scarring or potentially pulmonary nodules. -Her latest CT chest from 01/27/18 showsNo acute findings. Scarring in the left upper lobe.   PLAN -Labs reviewed, will proceed withRetacritand B12 injection today -Lab and Retacrit every 2 weeks X4, will give IV Iron if Ferritin less than 100.  -Continue B12 injectionevery 2 weeks  -F/u in 2 months    No problem-specific Assessment & Plan notes found for this encounter.   No orders of the defined types were placed in this encounter.  All questions were answered. The patient knows to call the clinic with any problems, questions or concerns. No barriers to learning was detected. The total time spent in the appointment was 30 minutes.     Truitt Merle, MD 09/14/2019   I, Joslyn Devon, am acting as scribe for Truitt Merle, MD.   I have reviewed the above documentation for accuracy and completeness, and I agree with the above.

## 2019-09-04 ENCOUNTER — Other Ambulatory Visit: Payer: Self-pay | Admitting: Internal Medicine

## 2019-09-07 ENCOUNTER — Ambulatory Visit: Payer: Medicare Other

## 2019-09-07 ENCOUNTER — Ambulatory Visit (INDEPENDENT_AMBULATORY_CARE_PROVIDER_SITE_OTHER): Payer: Medicare Other | Admitting: Cardiovascular Disease

## 2019-09-07 ENCOUNTER — Other Ambulatory Visit: Payer: Medicare Other

## 2019-09-07 ENCOUNTER — Other Ambulatory Visit: Payer: Self-pay

## 2019-09-07 ENCOUNTER — Encounter: Payer: Self-pay | Admitting: Cardiovascular Disease

## 2019-09-07 VITALS — BP 140/64 | HR 76 | Ht 65.0 in | Wt 186.0 lb

## 2019-09-07 DIAGNOSIS — I2510 Atherosclerotic heart disease of native coronary artery without angina pectoris: Secondary | ICD-10-CM | POA: Diagnosis not present

## 2019-09-07 NOTE — Patient Instructions (Signed)
Medication Instructions:  *If you need a refill on your cardiac medications before your next appointment, please call your pharmacy*  Lab Work: If you have labs (blood work) drawn today and your tests are completely normal, you will receive your results only by: Marland Kitchen MyChart Message (if you have MyChart) OR . A paper copy in the mail If you have any lab test that is abnormal or we need to change your treatment, we will call you to review the results.  Follow-Up: At [facility], you and your health needs are our priority.  As part of our continuing mission to provide you with exceptional heart care, we have created designated Provider Care Teams.  These Care Teams include your primary Cardiologist (physician) and Advanced Practice Providers (APPs -  Physician Assistants and Nurse Practitioners) who all work together to provide you with the care you need, when you need it.  We recommend signing up for the patient portal called "MyChart".  Sign up information is provided on this After Visit Summary.  MyChart is used to connect with patients for Virtual Visits (Telemedicine).  Patients are able to view lab/test results, encounter notes, upcoming appointments, etc.  Non-urgent messages can be sent to your provider as well.   To learn more about what you can do with MyChart, go to [email or web address].    Your next appointment:   1 year(s)  The format for your next appointment:   In Person  Provider:   You may see [name], MD or one of the following Advanced Practice Providers on your designated Care Team:    Truitt Merle, NP  Cecilie Kicks, NP  Kathyrn Drown, NP

## 2019-09-08 ENCOUNTER — Telehealth: Payer: Self-pay | Admitting: Cardiovascular Disease

## 2019-09-08 NOTE — Telephone Encounter (Signed)
Left message for Ginger with [employer] to call back.

## 2019-09-08 NOTE — Telephone Encounter (Signed)
Ginger from [facility] is calling to see what is the pt's parameters regarding her BP. Her last 3 BP readings are 160/76; 77, 142/66; 81, 164/75; 77. Wants to know if she should be alarmed by the top number or if that is normal due to the pt sickness. Please call to discuss

## 2019-09-08 NOTE — Telephone Encounter (Signed)
Ginger from [facility] is calling to see what is the pt's parameters regarding her BP. Her last 3 BP readings are 160/76; 77, 142/66; 81, 164/75; 77. Wants to know if she should be alarmed by the top number or if that is normal due to the pt sickness. Please call to discuss

## 2019-09-10 ENCOUNTER — Encounter: Payer: Self-pay | Admitting: Internal Medicine

## 2019-09-10 ENCOUNTER — Ambulatory Visit (INDEPENDENT_AMBULATORY_CARE_PROVIDER_SITE_OTHER): Payer: Medicare Other | Admitting: Internal Medicine

## 2019-09-10 ENCOUNTER — Other Ambulatory Visit (HOSPITAL_COMMUNITY): Payer: Self-pay

## 2019-09-10 VITALS — BP 138/60 | HR 96 | Ht 65.0 in | Wt 187.0 lb

## 2019-09-10 DIAGNOSIS — D638 Anemia in other chronic diseases classified elsewhere: Secondary | ICD-10-CM

## 2019-09-10 DIAGNOSIS — R1313 Dysphagia, pharyngeal phase: Secondary | ICD-10-CM | POA: Diagnosis not present

## 2019-09-10 DIAGNOSIS — K529 Noninfective gastroenteritis and colitis, unspecified: Secondary | ICD-10-CM

## 2019-09-10 DIAGNOSIS — D509 Iron deficiency anemia, unspecified: Secondary | ICD-10-CM

## 2019-09-10 DIAGNOSIS — K2100 Gastro-esophageal reflux disease with esophagitis, without bleeding: Secondary | ICD-10-CM

## 2019-09-10 DIAGNOSIS — R1310 Dysphagia, unspecified: Secondary | ICD-10-CM

## 2019-09-10 NOTE — Progress Notes (Signed)
[name] 74 y.o. February 14, 1946 [id number]  Assessment & Plan:   Encounter Diagnoses  Name Primary?  . Gastroesophageal reflux disease with esophagitis without hemorrhage Yes  . Iron deficiency anemia, unspecified iron deficiency anemia type   . Anemia of chronic disease   . Pharyngeal dysphagia   . Chronic diarrhea - responds to budesonide    GERD diet PPI  Modified barium swallow for dysphagia  Iron and EPO support for anemia which is a recurrent issue really Would not pursue further GI w/ w/ EGD 2021, colonoscopy 2020. Suspect multifactorial anemia and low yield from capsule endoscopy. If supportive tx fails will reconsider. Her esophagitis could have caused heme + stool as could have known hemorrhoids.  Continue low-dose budesonide for diarrhea  VO:HYWVPX, Jori Moll, MD Dr. Madelon Lips, MD [facility]  Subjective:   Chief Complaint: anemia, heme + dysphagia  HPI 74 yo ww w/ chronic diarrhea on budesinde and s/p ileocolonic resection here for f/u after EGD in setting of heme + stool and iron-def w/ anemia (also has anemia of chronic disease)  EGD 08/27/2019 with reflux esophagitis small hiatus hernia, pantoprazole started. Colonoscopy 05/08/2018 with internal and external hemorrhoids thought to be a source of rectal bleeding and otherwise normal status post ileocolectomy  He had Feraheme and erythropoietin injections in June.  C/O "strangling" with water and some pills >>>> solid food. "Trying to go down wrong pipe"  No esophageal dysphagia Allergies  Allergen Reactions  . Percocet [Oxycodone-Acetaminophen] Other (See Comments)    headaches  . Diazepam Other (See Comments)    REACTION: Agitation  . Haloperidol Lactate Nausea And Vomiting  . Lorazepam Nausea Only  . Morphine Sulfate Other (See Comments)    REACTION: Agitation  . Propoxyphene Hcl Nausea And Vomiting  . Tramadol Hcl Nausea And Vomiting  . Anesthetics, Halogenated     Per patient's  daughter, she can not tolerate anesthetics.   . Etomidate     Heart stopped during anesthesia  . Fentanyl     Heart stopped during anesthesia   . Versed [Midazolam]     Heart stopped during anesthesia   Current Meds  Medication Sig  . acetaminophen (TYLENOL) 325 MG tablet Take 650 mg by mouth every 6 (six) hours as needed for mild pain.  Marland Kitchen amLODipine (NORVASC) 10 MG tablet Take 1 tablet (10 mg total) by mouth daily.  Marland Kitchen atorvastatin (LIPITOR) 40 MG tablet Take 40 mg by mouth daily.  . budesonide (ENTOCORT EC) 3 MG 24 hr capsule Take 1 capsule (3 mg total) by mouth daily.  . carvedilol (COREG) 25 MG tablet Take 25 mg by mouth 2 (two) times daily.  . colestipol (COLESTID) 1 g tablet Take 2 g by mouth at bedtime.  Marland Kitchen desloratadine (CLARINEX) 5 MG tablet Take 5 mg by mouth at bedtime as needed (allergies). Reported on 04/11/2015  . ferrous fumarate (HEMOCYTE - 106 MG FE) 325 (106 Fe) MG TABS tablet Take 1 tablet by mouth as directed.  . gabapentin (NEURONTIN) 300 MG capsule Take 1 capsule by mouth at bedtime  . hydrALAZINE (APRESOLINE) 100 MG tablet Take 1 tablet (100 mg total) by mouth 3 (three) times daily.  . hydrOXYzine (ATARAX/VISTARIL) 50 MG tablet Take 50 mg by mouth 2 (two) times daily as needed.  . isosorbide mononitrate (IMDUR) 30 MG 24 hr tablet Take 1/2 (one-half) tablet by mouth once daily  . LANTUS SOLOSTAR 100 UNIT/ML Solostar Pen SMARTSIG:14 Unit(s) SUB-Q Every Night  . linagliptin (TRADJENTA) 5 MG  TABS tablet Take 5 mg by mouth daily.  . Melatonin 5 MG TABS Take 10 mg by mouth at bedtime.  . nitroGLYCERIN (NITROSTAT) 0.4 MG SL tablet DISSOLVE 1 TABLET UNDER THE TONGUE EVERY 5 MINUTES AS  NEEDED FOR CHEST PAIN. MAX  OF 3 TABLETS IN 15 MINUTES. CALL 911 IF PAIN PERSISTS.  Marland Kitchen pantoprazole (PROTONIX) 40 MG tablet Take 1 tablet (40 mg total) by mouth daily before breakfast.  . torsemide (DEMADEX) 20 MG tablet Take 20 mg by mouth daily.  Marland Kitchen ULORIC 40 MG tablet Take 1 tablet by mouth  daily.  . Vitamin D, Ergocalciferol, (DRISDOL) 1.25 MG (50000 UNIT) CAPS capsule Take 1 capsule (50,000 Units total) by mouth every 7 (seven) days.   Past Medical History:  Diagnosis Date  . Abdominal discomfort    Chronic N/V/D. Presumptive dx Crohn's dx per elevated p ANCA. Failed Entocort and Pentasa. Sep 2003 - ileocolectomy c anastomosis per Dr Deon Pilling 2/2 adhesions - path was hegative for Crohns. EGD, Sm bowel follow through (11/03), and an eteroclysis (10/03) were unrevealing. Cuases hypomag and hypocalcemia.  . Adnexal mass 8/03   s/p lap BSO (R ovarian fibroma) & lysis of adhesions  . Allergy    Seasonal  . Anemia    Multifactorial. Baseline HgB 10-11 ish. B12 def - 150 in 3/10. Fe Def - ferritin 35 3/10. Both are being repleted.  . Breast cancer ([place]) 03/16/13   right, 5 o'clock  . CAD (coronary artery disease) 1996   1996 - PTCA and angioplasty diagonal branch. 2000 - Rotoblator & angiopllasty of diagonal. 2006 - subendocardial AMI, DES to proximal LAD.Marland Kitchen Also had 90% stenosis in distal apical LAD. EF 55 with apical hypokinesis. Indefinite ASA and Plavix.  . CHF (congestive heart failure) ([place])   . Chronic kidney disease    Chronic renal insuff baseline Cr 1.2 - 1.4 ish.  . Chronic pain    CT 10/10 = Spinal stenosis L2 - S1.  . Diabetes mellitus    Insulin dependent  . Gout   . Hx of radiation therapy 06/02/13- 07/16/13   right rbeast 4500 cGy 25 sessions, right breast boost 1600 cGy in 8 sessions  . Hyperlipidemia    Managed with both a statin and Welchol. Welchol stopped 2014 2/2 cost and started on fenofibrate   . Hypertension    2006 B renal arteries patent. 2003 MRA - no RAS. 2003 pheo W/U Dr Hassell Done reportedly negative.  . Hypoxia 07/23/2017  . Lupus ([place])   . Lymphedema of breast   . Personal history of radiation therapy 2015  . RBBB   . Renal vein thrombosis ([place])   . SBO (small bowel obstruction) ([place]) 09/17/2017  . Secondary hyperparathyroidism ([place])   . Stroke  [facility])    Incidental finding MRI 2002 L lacunar infarct  . Vitamin B12 deficiency   . Vitamin D deficiency   . Wears dentures    top   Past Surgical History:  Procedure Laterality Date  . ABDOMINAL HYSTERECTOMY    . BILATERAL SALPINGOOPHORECTOMY  8/03   Lap BSO (R ovarian fibroma) and adhesion lysis  . BOWEL RESECTION  2003   ileocolectomy with anastomosis 2/2 adhesions  . BREAST BIOPSY Right 2015  . BREAST BIOPSY Right 07/2014  . BREAST LUMPECTOMY Right 04/22/2013  . BREAST LUMPECTOMY WITH NEEDLE LOCALIZATION AND AXILLARY SENTINEL LYMPH NODE BX Right 04/22/2013   Procedure: BREAST LUMPECTOMY WITH NEEDLE LOCALIZATION AND AXILLARY SENTINEL LYMPH NODE BX;  Surgeon: Stark Klein, MD;  Location: [place];  Service: General;  Laterality: Right;  . CARDIAC CATHETERIZATION     2 stents  . CHOLECYSTECTOMY    . COLONOSCOPY    . HEMICOLECTOMY     R sided hemicolectomy  . HERNIA REPAIR     Ventral hernia repair  . PTCA  4/06   Social History   Social History Narrative   She is separated 2 sons 3 daughters   She has grandchildren and great-grandchildren   She is retired   Never smoker no tobacco now no drugs no alcohol no caffeine   family history includes Breast cancer in her maternal aunt and maternal grandmother; Breast cancer (age of onset: 32) in her mother; Clotting disorder in her maternal grandmother; Colon cancer in her maternal grandmother; Diabetes in her maternal grandmother; Heart attack in her maternal grandmother; Hypertension in her daughter; Kidney failure in her maternal aunt; Lung cancer in her maternal aunt; Pancreatic cancer (age of onset: 27) in her brother; Prostate cancer in her maternal uncle.   Review of Systems As above  Objective:   Physical Exam BP 138/60   Pulse 96   Ht 5' 5"  (1.651 m)   Wt 187 lb (84.8 kg)   BMI 31.12 kg/m

## 2019-09-10 NOTE — Patient Instructions (Addendum)
You have been scheduled for a modified barium swallow on 09/17/2019 at 11:00AM. Please arrive 15 minutes prior to your test for registration. You will go to [facility] (1st Floor) for your appointment. Should you need to cancel or reschedule your appointment, please contact [phone] [facility] [place]) or [phone] [place]). _____________________________________________________________________ A Modified Barium Swallow Study, or MBS, is a special x-ray that is taken to check swallowing skills. It is carried out by a Stage manager and a Psychologist, clinical (SLP). During this test, yourmouth, throat, and esophagus, a muscular tube which connects your mouth to your stomach, is checked. The test will help you, your doctor, and the SLP plan what types of foods and liquids are easier for you to swallow. The SLP will also identify positions and ways to help you swallow more easily and safely. What will happen during an MBS? You will be taken to an x-ray room and seated comfortably. You will be asked to swallow small amounts of food and liquid mixed with barium. Barium is a liquid or paste that allows images of your mouth, throat and esophagus to be seen on x-ray. The x-ray captures moving images of the food you are swallowing as it travels from your mouth through your throat and into your esophagus. This test helps identify whether food or liquid is entering your lungs (aspiration). The test also shows which part of your mouth or throat lacks strength or coordination to move the food or liquid in the right direction. This test typically takes 30 minutes to 1 hour to complete. _______________________________________________________________________ __________________________________________________________________________________  Please continue your pantoprazole.  Follow up as needed.  I appreciate the opportunity to care for you. Silvano Rusk , MD, [facility]  Gastroesophageal Reflux Disease, Adult When you have gastroesophageal reflux disease (GERD), the foods you eat and your eating habits are very important. Choosing the right foods can help ease the discomfort of GERD. Consider working with a diet and nutrition specialist (dietitian) to help you make healthy food choices. What general guidelines should I follow?  Eating plan  Choose healthy foods low in fat, such as fruits, vegetables, whole grains, low-fat dairy products, and lean meat, fish, and poultry.  Eat frequent, small meals instead of three large meals each day. Eat your meals slowly, in a relaxed setting. Avoid bending over or lying down until 2-3 hours after eating.  Limit high-fat foods such as fatty meats or fried foods.  Limit your intake of oils, butter, and shortening to less than 8 teaspoons each day.  Avoid the following: ? Foods that cause symptoms. These may be different for different people. Keep a food diary to keep track of foods that cause symptoms. ? Alcohol. ? Drinking large amounts of liquid with meals. ? Eating meals during the 2-3 hours before bed.  Cook foods using methods other than frying. This may include baking, grilling, or broiling. Lifestyle  Maintain a healthy weight. Ask your health care provider what weight is healthy for you. If you need to lose weight, work with your health care provider to do so safely.  Exercise for at least 30 minutes on 5 or more days each week, or as told by your health care provider.  Avoid wearing clothes that fit tightly around your waist and chest.  Do not use any products that contain nicotine or tobacco, such as cigarettes and e-cigarettes. If you need help quitting, ask your health care provider.  Sleep with the head of your bed raised.  Use a wedge under the mattress or blocks under the bed frame to raise the head of the bed. What foods are not recommended? The items listed may not be a complete list. Talk with your  dietitian about what dietary choices are best for you. Grains Pastries or quick breads with added fat. [place] toast. Vegetables Deep fried vegetables. [place] fries. Any vegetables prepared with added fat. Any vegetables that cause symptoms. For some people this may include tomatoes and tomato products, chili peppers, onions and garlic, and horseradish. Fruits Any fruits prepared with added fat. Any fruits that cause symptoms. For some people this may include citrus fruits, such as oranges, grapefruit, pineapple, and lemons. Meats and other protein foods High-fat meats, such as fatty beef or pork, hot dogs, ribs, ham, sausage, salami and bacon. Fried meat or protein, including fried fish and fried chicken. Nuts and nut butters. Dairy Whole milk and chocolate milk. Sour cream. Cream. Ice cream. Cream cheese. Milk shakes. Beverages Coffee and tea, with or without caffeine. Carbonated beverages. Sodas. Energy drinks. Fruit juice made with acidic fruits (such as orange or grapefruit). Tomato juice. Alcoholic drinks. Fats and oils Butter. Margarine. Shortening. Ghee. Sweets and desserts Chocolate and cocoa. Donuts. Seasoning and other foods Pepper. Peppermint and spearmint. Any condiments, herbs, or seasonings that cause symptoms. For some people, this may include curry, hot sauce, or vinegar-based salad dressings. Summary  When you have gastroesophageal reflux disease (GERD), food and lifestyle choices are very important to help ease the discomfort of GERD.  Eat frequent, small meals instead of three large meals each day. Eat your meals slowly, in a relaxed setting. Avoid bending over or lying down until 2-3 hours after eating.  Limit high-fat foods such as fatty meat or fried foods. This information is not intended to replace advice given to you by your health care provider. Make sure you discuss any questions you have with your health care provider. Document Revised: 06/26/2018 Document  Reviewed: 03/06/2016 Elsevier Patient Education  [place].  Gastroesophageal Reflux Disease, Adult Gastroesophageal reflux (GER) happens when acid from the stomach flows up into the tube that connects the mouth and the stomach (esophagus). Normally, food travels down the esophagus and stays in the stomach to be digested. With GER, food and stomach acid sometimes move back up into the esophagus. You may have a disease called gastroesophageal reflux disease (GERD) if the reflux:  Happens often.  Causes frequent or very bad symptoms.  Causes problems such as damage to the esophagus. When this happens, the esophagus becomes sore and swollen (inflamed). Over time, GERD can make small holes (ulcers) in the lining of the esophagus. What are the causes? This condition is caused by a problem with the muscle between the esophagus and the stomach. When this muscle is weak or not normal, it does not close properly to keep food and acid from coming back up from the stomach. The muscle can be weak because of:  Tobacco use.  Pregnancy.  Having a certain type of hernia (hiatal hernia).  Alcohol use.  Certain foods and drinks, such as coffee, chocolate, onions, and peppermint. What increases the risk? You are more likely to develop this condition if you:  Are overweight.  Have a disease that affects your connective tissue.  Use NSAID medicines. What are the signs or symptoms? Symptoms of this condition include:  Heartburn.  Difficult or painful swallowing.  The feeling of having a lump in the throat.  A bitter taste in  the mouth.  Bad breath.  Having a lot of saliva.  Having an upset or bloated stomach.  Belching.  Chest pain. Different conditions can cause chest pain. Make sure you see your doctor if you have chest pain.  Shortness of breath or noisy breathing (wheezing).  Ongoing (chronic) cough or a cough at night.  Wearing away of the surface of teeth (tooth  enamel).  Weight loss. How is this treated? Treatment will depend on how bad your symptoms are. Your doctor may suggest:  Changes to your diet.  Medicine.  Surgery. Follow these instructions at home: Eating and drinking   Follow a diet as told by your doctor. You may need to avoid foods and drinks such as: ? Coffee and tea (with or without caffeine). ? Drinks that contain alcohol. ? Energy drinks and sports drinks. ? Bubbly (carbonated) drinks or sodas. ? Chocolate and cocoa. ? Peppermint and mint flavorings. ? Garlic and onions. ? Horseradish. ? Spicy and acidic foods. These include peppers, chili powder, curry powder, vinegar, hot sauces, and BBQ sauce. ? Citrus fruit juices and citrus fruits, such as oranges, lemons, and limes. ? Tomato-based foods. These include red sauce, chili, salsa, and pizza with red sauce. ? Fried and fatty foods. These include donuts, french fries, potato chips, and high-fat dressings. ? High-fat meats. These include hot dogs, rib eye steak, sausage, ham, and bacon. ? High-fat dairy items, such as whole milk, butter, and cream cheese.  Eat small meals often. Avoid eating large meals.  Avoid drinking large amounts of liquid with your meals.  Avoid eating meals during the 2-3 hours before bedtime.  Avoid lying down right after you eat.  Do not exercise right after you eat. Lifestyle   Do not use any products that contain nicotine or tobacco. These include cigarettes, e-cigarettes, and chewing tobacco. If you need help quitting, ask your doctor.  Try to lower your stress. If you need help doing this, ask your doctor.  If you are overweight, lose an amount of weight that is healthy for you. Ask your doctor about a safe weight loss goal. General instructions  Pay attention to any changes in your symptoms.  Take over-the-counter and prescription medicines only as told by your doctor. Do not take aspirin, ibuprofen, or other NSAIDs unless your  doctor says it is okay.  Wear loose clothes. Do not wear anything tight around your waist.  Raise (elevate) the head of your bed about 6 inches (15 cm).  Avoid bending over if this makes your symptoms worse.  Keep all follow-up visits as told by your doctor. This is important. Contact a doctor if:  You have new symptoms.  You lose weight and you do not know why.  You have trouble swallowing or it hurts to swallow.  You have wheezing or a cough that keeps happening.  Your symptoms do not get better with treatment.  You have a hoarse voice. Get help right away if:  You have pain in your arms, neck, jaw, teeth, or back.  You feel sweaty, dizzy, or light-headed.  You have chest pain or shortness of breath.  You throw up (vomit) and your throw-up looks like blood or coffee grounds.  You pass out (faint).  Your poop (stool) is bloody or black.  You cannot swallow, drink, or eat. Summary  If a person has gastroesophageal reflux disease (GERD), food and stomach acid move back up into the esophagus and cause symptoms or problems such as damage to  the esophagus.  Treatment will depend on how bad your symptoms are.  Follow a diet as told by your doctor.  Take all medicines only as told by your doctor. This information is not intended to replace advice given to you by your health care provider. Make sure you discuss any questions you have with your health care provider. Document Revised: 09/11/2017 Document Reviewed: 09/11/2017 Elsevier Patient Education  [place].

## 2019-09-14 ENCOUNTER — Inpatient Hospital Stay (HOSPITAL_BASED_OUTPATIENT_CLINIC_OR_DEPARTMENT_OTHER): Payer: Medicare Other | Admitting: Hematology

## 2019-09-14 ENCOUNTER — Other Ambulatory Visit: Payer: Self-pay

## 2019-09-14 ENCOUNTER — Telehealth: Payer: Self-pay | Admitting: Hematology

## 2019-09-14 ENCOUNTER — Inpatient Hospital Stay: Payer: Medicare Other

## 2019-09-14 ENCOUNTER — Encounter: Payer: Self-pay | Admitting: Hematology

## 2019-09-14 ENCOUNTER — Other Ambulatory Visit: Payer: Self-pay | Admitting: Lab

## 2019-09-14 ENCOUNTER — Other Ambulatory Visit: Payer: Self-pay | Admitting: Hematology

## 2019-09-14 VITALS — BP 125/50 | HR 74 | Temp 98.6°F | Resp 18 | Ht 65.0 in | Wt 192.2 lb

## 2019-09-14 DIAGNOSIS — C50311 Malignant neoplasm of lower-inner quadrant of right female breast: Secondary | ICD-10-CM | POA: Diagnosis not present

## 2019-09-14 DIAGNOSIS — D509 Iron deficiency anemia, unspecified: Secondary | ICD-10-CM

## 2019-09-14 DIAGNOSIS — N184 Chronic kidney disease, stage 4 (severe): Secondary | ICD-10-CM | POA: Diagnosis not present

## 2019-09-14 DIAGNOSIS — Z170 Estrogen receptor positive status [ER+]: Secondary | ICD-10-CM | POA: Diagnosis not present

## 2019-09-14 DIAGNOSIS — D631 Anemia in chronic kidney disease: Secondary | ICD-10-CM

## 2019-09-14 LAB — CBC WITH DIFFERENTIAL (CANCER CENTER ONLY)
Abs Immature Granulocytes: 0.04 10*3/uL (ref 0.00–0.07)
Basophils Absolute: 0 10*3/uL (ref 0.0–0.1)
Basophils Relative: 0 %
Eosinophils Absolute: 0.1 10*3/uL (ref 0.0–0.5)
Eosinophils Relative: 1 %
HCT: 27.3 % — ABNORMAL LOW (ref 36.0–46.0)
Hemoglobin: 8.3 g/dL — ABNORMAL LOW (ref 12.0–15.0)
Immature Granulocytes: 0 %
Lymphocytes Relative: 19 %
Lymphs Abs: 1.7 10*3/uL (ref 0.7–4.0)
MCH: 27.9 pg (ref 26.0–34.0)
MCHC: 30.4 g/dL (ref 30.0–36.0)
MCV: 91.9 fL (ref 80.0–100.0)
Monocytes Absolute: 0.6 10*3/uL (ref 0.1–1.0)
Monocytes Relative: 7 %
Neutro Abs: 6.5 10*3/uL (ref 1.7–7.7)
Neutrophils Relative %: 73 %
Platelet Count: 194 10*3/uL (ref 150–400)
RBC: 2.97 MIL/uL — ABNORMAL LOW (ref 3.87–5.11)
RDW: 17.7 % — ABNORMAL HIGH (ref 11.5–15.5)
WBC Count: 9.1 10*3/uL (ref 4.0–10.5)
nRBC: 0 % (ref 0.0–0.2)

## 2019-09-14 LAB — FERRITIN: Ferritin: 286 ng/mL (ref 11–307)

## 2019-09-14 MED ORDER — ALTEPLASE 2 MG IJ SOLR
2.0000 mg | Freq: Once | INTRAMUSCULAR | Status: DC | PRN
  Filled 2019-09-14: qty 2

## 2019-09-14 MED ORDER — SODIUM CHLORIDE 0.9% FLUSH
10.0000 mL | Freq: Once | INTRAVENOUS | Status: DC | PRN
  Filled 2019-09-14: qty 10

## 2019-09-14 MED ORDER — HEPARIN SOD (PORK) LOCK FLUSH 100 UNIT/ML IV SOLN
500.0000 [IU] | Freq: Once | INTRAVENOUS | Status: DC | PRN
  Filled 2019-09-14: qty 5

## 2019-09-14 MED ORDER — SODIUM CHLORIDE 0.9% FLUSH
3.0000 mL | Freq: Once | INTRAVENOUS | Status: DC | PRN
  Filled 2019-09-14: qty 10

## 2019-09-14 MED ORDER — CYANOCOBALAMIN 1000 MCG/ML IJ SOLN
1000.0000 ug | Freq: Once | INTRAMUSCULAR | Status: AC
  Administered 2019-09-14: 1000 ug via INTRAMUSCULAR

## 2019-09-14 MED ORDER — EPOETIN ALFA-EPBX 40000 UNIT/ML IJ SOLN
INTRAMUSCULAR | Status: AC
  Filled 2019-09-14: qty 1

## 2019-09-14 MED ORDER — HEPARIN SOD (PORK) LOCK FLUSH 100 UNIT/ML IV SOLN
250.0000 [IU] | Freq: Once | INTRAVENOUS | Status: DC | PRN
  Filled 2019-09-14: qty 5

## 2019-09-14 MED ORDER — CYANOCOBALAMIN 1000 MCG/ML IJ SOLN
INTRAMUSCULAR | Status: AC
  Filled 2019-09-14: qty 1

## 2019-09-14 MED ORDER — EPOETIN ALFA-EPBX 40000 UNIT/ML IJ SOLN
40000.0000 [IU] | Freq: Once | INTRAMUSCULAR | Status: AC
  Administered 2019-09-14: 40000 [IU] via SUBCUTANEOUS

## 2019-09-14 NOTE — Telephone Encounter (Signed)
Scheduled per 6/28 los. Printed calendar and avs for pt.

## 2019-09-15 ENCOUNTER — Encounter: Payer: Self-pay | Admitting: Hematology

## 2019-09-17 ENCOUNTER — Ambulatory Visit (HOSPITAL_COMMUNITY): Payer: Medicare Other

## 2019-09-17 ENCOUNTER — Encounter (HOSPITAL_COMMUNITY): Payer: Medicare Other

## 2019-09-22 ENCOUNTER — Other Ambulatory Visit: Payer: Self-pay

## 2019-09-22 ENCOUNTER — Ambulatory Visit (HOSPITAL_COMMUNITY)
Admission: RE | Admit: 2019-09-22 | Discharge: 2019-09-22 | Disposition: A | Discharge status: Home / Self Care | Payer: Medicare Other | Source: Ambulatory Visit | Attending: Internal Medicine | Admitting: Internal Medicine

## 2019-09-22 DIAGNOSIS — R1313 Dysphagia, pharyngeal phase: Secondary | ICD-10-CM

## 2019-09-22 DIAGNOSIS — R1310 Dysphagia, unspecified: Secondary | ICD-10-CM | POA: Diagnosis not present

## 2019-09-28 ENCOUNTER — Inpatient Hospital Stay: Payer: Medicare Other | Attending: Internal Medicine

## 2019-09-28 ENCOUNTER — Other Ambulatory Visit: Payer: Self-pay

## 2019-09-28 ENCOUNTER — Inpatient Hospital Stay: Payer: Medicare Other

## 2019-09-28 VITALS — BP 137/55 | HR 72 | Resp 18

## 2019-09-28 DIAGNOSIS — D631 Anemia in chronic kidney disease: Secondary | ICD-10-CM | POA: Diagnosis present

## 2019-09-28 DIAGNOSIS — D509 Iron deficiency anemia, unspecified: Secondary | ICD-10-CM

## 2019-09-28 DIAGNOSIS — N184 Chronic kidney disease, stage 4 (severe): Secondary | ICD-10-CM | POA: Insufficient documentation

## 2019-09-28 DIAGNOSIS — E538 Deficiency of other specified B group vitamins: Secondary | ICD-10-CM | POA: Insufficient documentation

## 2019-09-28 LAB — CBC WITH DIFFERENTIAL (CANCER CENTER ONLY)
Abs Immature Granulocytes: 0.07 10*3/uL (ref 0.00–0.07)
Basophils Absolute: 0 10*3/uL (ref 0.0–0.1)
Basophils Relative: 0 %
Eosinophils Absolute: 0.1 10*3/uL (ref 0.0–0.5)
Eosinophils Relative: 1 %
HCT: 28.8 % — ABNORMAL LOW (ref 36.0–46.0)
Hemoglobin: 8.7 g/dL — ABNORMAL LOW (ref 12.0–15.0)
Immature Granulocytes: 1 %
Lymphocytes Relative: 16 %
Lymphs Abs: 1.5 10*3/uL (ref 0.7–4.0)
MCH: 26.9 pg (ref 26.0–34.0)
MCHC: 30.2 g/dL (ref 30.0–36.0)
MCV: 89.2 fL (ref 80.0–100.0)
Monocytes Absolute: 0.7 10*3/uL (ref 0.1–1.0)
Monocytes Relative: 8 %
Neutro Abs: 6.8 10*3/uL (ref 1.7–7.7)
Neutrophils Relative %: 74 %
Platelet Count: 175 10*3/uL (ref 150–400)
RBC: 3.23 MIL/uL — ABNORMAL LOW (ref 3.87–5.11)
RDW: 17.6 % — ABNORMAL HIGH (ref 11.5–15.5)
WBC Count: 9.2 10*3/uL (ref 4.0–10.5)
nRBC: 0 % (ref 0.0–0.2)

## 2019-09-28 MED ORDER — CYANOCOBALAMIN 1000 MCG/ML IJ SOLN
1000.0000 ug | Freq: Once | INTRAMUSCULAR | Status: AC
  Administered 2019-09-28: 1000 ug via INTRAMUSCULAR

## 2019-09-28 NOTE — Patient Instructions (Signed)
Cyanocobalamin, Vitamin B12 injection What is this medicine? CYANOCOBALAMIN (sye an oh koe BAL a min) is a man made form of vitamin B12. Vitamin B12 is used in the growth of healthy blood cells, nerve cells, and proteins in the body. It also helps with the metabolism of fats and carbohydrates. This medicine is used to treat people who can not absorb vitamin B12. This medicine may be used for other purposes; ask your health care provider or pharmacist if you have questions. COMMON BRAND NAME(S): B-12 Compliance Kit, B-12 Injection Kit, Cyomin, LA-12, Nutri-Twelve, Physicians EZ Use B-12, Primabalt What should I tell my health care provider before I take this medicine? They need to know if you have any of these conditions:  kidney disease  Leber's disease  megaloblastic anemia  an unusual or allergic reaction to cyanocobalamin, cobalt, other medicines, foods, dyes, or preservatives  pregnant or trying to get pregnant  breast-feeding How should I use this medicine? This medicine is injected into a muscle or deeply under the skin. It is usually given by a health care professional in a clinic or doctor's office. However, your doctor may teach you how to inject yourself. Follow all instructions. Talk to your pediatrician regarding the use of this medicine in children. Special care may be needed. Overdosage: If you think you have taken too much of this medicine contact a poison control center or emergency room at once. NOTE: This medicine is only for you. Do not share this medicine with others. What if I miss a dose? If you are given your dose at a clinic or doctor's office, call to reschedule your appointment. If you give your own injections and you miss a dose, take it as soon as you can. If it is almost time for your next dose, take only that dose. Do not take double or extra doses. What may interact with this medicine?  colchicine  heavy alcohol intake This list may not describe all  possible interactions. Give your health care provider a list of all the medicines, herbs, non-prescription drugs, or dietary supplements you use. Also tell them if you smoke, drink alcohol, or use illegal drugs. Some items may interact with your medicine. What should I watch for while using this medicine? Visit your doctor or health care professional regularly. You may need blood work done while you are taking this medicine. You may need to follow a special diet. Talk to your doctor. Limit your alcohol intake and avoid smoking to get the best benefit. What side effects may I notice from receiving this medicine? Side effects that you should report to your doctor or health care professional as soon as possible:  allergic reactions like skin rash, itching or hives, swelling of the face, lips, or tongue  blue tint to skin  chest tightness, pain  difficulty breathing, wheezing  dizziness  red, swollen painful area on the leg Side effects that usually do not require medical attention (report to your doctor or health care professional if they continue or are bothersome):  diarrhea  headache This list may not describe all possible side effects. Call your doctor for medical advice about side effects. You may report side effects to FDA at 1-800-FDA-1088. Where should I keep my medicine? Keep out of the reach of children. Store at room temperature between 15 and 30 degrees C (59 and 85 degrees F). Protect from light. Throw away any unused medicine after the expiration date. NOTE: This sheet is a summary. It may not cover  all possible information. If you have questions about this medicine, talk to your doctor, pharmacist, or health care provider.  2020 Elsevier/Gold Standard (2007-06-16 22:10:20)  

## 2019-10-12 ENCOUNTER — Other Ambulatory Visit: Payer: Self-pay

## 2019-10-12 ENCOUNTER — Inpatient Hospital Stay: Payer: Medicare Other

## 2019-10-12 VITALS — BP 142/65 | HR 70 | Temp 98.3°F | Resp 18

## 2019-10-12 DIAGNOSIS — D509 Iron deficiency anemia, unspecified: Secondary | ICD-10-CM

## 2019-10-12 DIAGNOSIS — N184 Chronic kidney disease, stage 4 (severe): Secondary | ICD-10-CM | POA: Diagnosis not present

## 2019-10-12 LAB — CBC WITH DIFFERENTIAL (CANCER CENTER ONLY)
Abs Immature Granulocytes: 0.14 10*3/uL — ABNORMAL HIGH (ref 0.00–0.07)
Basophils Absolute: 0 10*3/uL (ref 0.0–0.1)
Basophils Relative: 0 %
Eosinophils Absolute: 0 10*3/uL (ref 0.0–0.5)
Eosinophils Relative: 0 %
HCT: 29.8 % — ABNORMAL LOW (ref 36.0–46.0)
Hemoglobin: 9.3 g/dL — ABNORMAL LOW (ref 12.0–15.0)
Immature Granulocytes: 1 %
Lymphocytes Relative: 11 %
Lymphs Abs: 1.3 10*3/uL (ref 0.7–4.0)
MCH: 27 pg (ref 26.0–34.0)
MCHC: 31.2 g/dL (ref 30.0–36.0)
MCV: 86.6 fL (ref 80.0–100.0)
Monocytes Absolute: 0.9 10*3/uL (ref 0.1–1.0)
Monocytes Relative: 8 %
Neutro Abs: 9.2 10*3/uL — ABNORMAL HIGH (ref 1.7–7.7)
Neutrophils Relative %: 80 %
Platelet Count: 264 10*3/uL (ref 150–400)
RBC: 3.44 MIL/uL — ABNORMAL LOW (ref 3.87–5.11)
RDW: 16.5 % — ABNORMAL HIGH (ref 11.5–15.5)
WBC Count: 11.6 10*3/uL — ABNORMAL HIGH (ref 4.0–10.5)
nRBC: 0 % (ref 0.0–0.2)

## 2019-10-12 LAB — FERRITIN: Ferritin: 301 ng/mL (ref 11–307)

## 2019-10-12 MED ORDER — EPOETIN ALFA-EPBX 40000 UNIT/ML IJ SOLN
INTRAMUSCULAR | Status: AC
  Filled 2019-10-12: qty 1

## 2019-10-12 MED ORDER — CYANOCOBALAMIN 1000 MCG/ML IJ SOLN
1000.0000 ug | Freq: Once | INTRAMUSCULAR | Status: AC
  Administered 2019-10-12: 1000 ug via INTRAMUSCULAR

## 2019-10-12 MED ORDER — CYANOCOBALAMIN 1000 MCG/ML IJ SOLN
INTRAMUSCULAR | Status: AC
  Filled 2019-10-12: qty 1

## 2019-10-12 MED ORDER — EPOETIN ALFA-EPBX 40000 UNIT/ML IJ SOLN
40000.0000 [IU] | Freq: Once | INTRAMUSCULAR | Status: AC
  Administered 2019-10-12: 40000 [IU] via SUBCUTANEOUS

## 2019-10-12 NOTE — Patient Instructions (Signed)
Epoetin Alfa injection What is this medicine? EPOETIN ALFA (e POE e tin AL fa) helps your body make more red blood cells. This medicine is used to treat anemia caused by chronic kidney disease, cancer chemotherapy, or HIV-therapy. It may also be used before surgery if you have anemia. This medicine may be used for other purposes; ask your health care provider or pharmacist if you have questions. COMMON BRAND NAME(S): Epogen, Procrit, Retacrit What should I tell my health care provider before I take this medicine? They need to know if you have any of these conditions:  cancer  heart disease  high blood pressure  history of blood clots  history of stroke  low levels of folate, iron, or vitamin B12 in the blood  seizures  an unusual or allergic reaction to erythropoietin, albumin, benzyl alcohol, hamster proteins, other medicines, foods, dyes, or preservatives  pregnant or trying to get pregnant  breast-feeding How should I use this medicine? This medicine is for injection into a vein or under the skin. It is usually given by a health care professional in a hospital or clinic setting. If you get this medicine at home, you will be taught how to prepare and give this medicine. Use exactly as directed. Take your medicine at regular intervals. Do not take your medicine more often than directed. It is important that you put your used needles and syringes in a special sharps container. Do not put them in a trash can. If you do not have a sharps container, call your pharmacist or healthcare provider to get one. A special MedGuide will be given to you by the pharmacist with each prescription and refill. Be sure to read this information carefully each time. Talk to your pediatrician regarding the use of this medicine in children. While this drug may be prescribed for selected conditions, precautions do apply. Overdosage: If you think you have taken too much of this medicine contact a poison  control center or emergency room at once. NOTE: This medicine is only for you. Do not share this medicine with others. What if I miss a dose? If you miss a dose, take it as soon as you can. If it is almost time for your next dose, take only that dose. Do not take double or extra doses. What may interact with this medicine? Interactions have not been studied. This list may not describe all possible interactions. Give your health care provider a list of all the medicines, herbs, non-prescription drugs, or dietary supplements you use. Also tell them if you smoke, drink alcohol, or use illegal drugs. Some items may interact with your medicine. What should I watch for while using this medicine? Your condition will be monitored carefully while you are receiving this medicine. You may need blood work done while you are taking this medicine. This medicine may cause a decrease in vitamin B6. You should make sure that you get enough vitamin B6 while you are taking this medicine. Discuss the foods you eat and the vitamins you take with your health care professional. What side effects may I notice from receiving this medicine? Side effects that you should report to your doctor or health care professional as soon as possible:  allergic reactions like skin rash, itching or hives, swelling of the face, lips, or tongue  seizures  signs and symptoms of a blood clot such as breathing problems; changes in vision; chest pain; severe, sudden headache; pain, swelling, warmth in the leg; trouble speaking; sudden numbness or  weakness of the face, arm or leg  signs and symptoms of a stroke like changes in vision; confusion; trouble speaking or understanding; severe headaches; sudden numbness or weakness of the face, arm or leg; trouble walking; dizziness; loss of balance or coordination Side effects that usually do not require medical attention (report to your doctor or health care professional if they continue or are  bothersome):  chills  cough  dizziness  fever  headaches  joint pain  muscle cramps  muscle pain  nausea, vomiting  pain, redness, or irritation at site where injected This list may not describe all possible side effects. Call your doctor for medical advice about side effects. You may report side effects to FDA at 1-800-FDA-1088. Where should I keep my medicine? Keep out of the reach of children. Store in a refrigerator between 2 and 8 degrees C (36 and 46 degrees F). Do not freeze or shake. Throw away any unused portion if using a single-dose vial. Multi-dose vials can be kept in the refrigerator for up to 21 days after the initial dose. Throw away unused medicine. NOTE: This sheet is a summary. It may not cover all possible information. If you have questions about this medicine, talk to your doctor, pharmacist, or health care provider.  2020 Elsevier/Gold Standard (2016-10-12 08:35:19) Cyanocobalamin, Pyridoxine, and Folate What is this medicine? A multivitamin containing folic acid, vitamin B6, and vitamin B12. This medicine may be used for other purposes; ask your health care provider or pharmacist if you have questions. COMMON BRAND NAME(S): AllanFol RX, AllanTex, Av-Vite FB, B Complex with Folic Acid, ComBgen, FaBB, Folamin, Folastin, [place], [place], [place], [place], [place], [employer], [place] RX 2.2, [place], [place] 2.2, Foltabs 800, Foltx, Homocysteine Formula, Niva-Fol, NuFol, [employer], Virt-Gard, Virt-Vite, Virt-Vite [place], Vita-Respa What should I tell my health care provider before I take this medicine? They need to know if you have any of these conditions:  bleeding or clotting disorder  history of anemia of any type  other chronic health condition  an unusual or allergic reaction to vitamins, other medicines, foods, dyes, or preservatives  pregnant or trying to get pregnant  breast-feeding How should I use this medicine? Take by mouth with a glass  of water. May take with food. Follow the directions on the prescription label. It is usually given once a day. Do not take your medicine more often than directed. Contact your pediatrician regarding the use of this medicine in children. Special care may be needed. Overdosage: If you think you have taken too much of this medicine contact a poison control center or emergency room at once. NOTE: This medicine is only for you. Do not share this medicine with others. What if I miss a dose? If you miss a dose, take it as soon as you can. If it is almost time for your next dose, take only that dose. Do not take double or extra doses. What may interact with this medicine?  levodopa This list may not describe all possible interactions. Give your health care provider a list of all the medicines, herbs, non-prescription drugs, or dietary supplements you use. Also tell them if you smoke, drink alcohol, or use illegal drugs. Some items may interact with your medicine. What should I watch for while using this medicine? See your health care professional for regular checks on your progress. Remember that vitamin supplements do not replace the need for good nutrition from a balanced diet. What side effects may I notice from receiving  this medicine? Side effects that you should report to your doctor or health care professional as soon as possible:  allergic reaction such as skin rash or difficulty breathing  vomiting Side effects that usually do not require medical attention (report to your doctor or health care professional if they continue or are bothersome):  nausea  stomach upset This list may not describe all possible side effects. Call your doctor for medical advice about side effects. You may report side effects to FDA at 1-800-FDA-1088. Where should I keep my medicine? Keep out of the reach of children. Most vitamins should be stored at controlled room temperature. Check your specific product  directions. Protect from heat and moisture. Throw away any unused medicine after the expiration date. NOTE: This sheet is a summary. It may not cover all possible information. If you have questions about this medicine, talk to your doctor, pharmacist, or health care provider.  2020 Elsevier/Gold Standard (2007-04-26 00:59:55)

## 2019-10-26 ENCOUNTER — Inpatient Hospital Stay: Payer: Medicare Other | Attending: Internal Medicine

## 2019-10-26 ENCOUNTER — Other Ambulatory Visit: Payer: Self-pay | Admitting: Cardiology

## 2019-10-26 ENCOUNTER — Inpatient Hospital Stay: Payer: Medicare Other

## 2019-10-26 ENCOUNTER — Other Ambulatory Visit: Payer: Self-pay

## 2019-10-26 VITALS — BP 139/57 | HR 70 | Temp 98.3°F | Resp 18

## 2019-10-26 DIAGNOSIS — E538 Deficiency of other specified B group vitamins: Secondary | ICD-10-CM | POA: Diagnosis not present

## 2019-10-26 DIAGNOSIS — D509 Iron deficiency anemia, unspecified: Secondary | ICD-10-CM

## 2019-10-26 DIAGNOSIS — D631 Anemia in chronic kidney disease: Secondary | ICD-10-CM | POA: Diagnosis present

## 2019-10-26 DIAGNOSIS — N184 Chronic kidney disease, stage 4 (severe): Secondary | ICD-10-CM | POA: Diagnosis not present

## 2019-10-26 LAB — CBC WITH DIFFERENTIAL (CANCER CENTER ONLY)
Abs Immature Granulocytes: 0.05 10*3/uL (ref 0.00–0.07)
Basophils Absolute: 0 10*3/uL (ref 0.0–0.1)
Basophils Relative: 0 %
Eosinophils Absolute: 0.1 10*3/uL (ref 0.0–0.5)
Eosinophils Relative: 1 %
HCT: 30.1 % — ABNORMAL LOW (ref 36.0–46.0)
Hemoglobin: 9 g/dL — ABNORMAL LOW (ref 12.0–15.0)
Immature Granulocytes: 1 %
Lymphocytes Relative: 18 %
Lymphs Abs: 1.6 10*3/uL (ref 0.7–4.0)
MCH: 26.6 pg (ref 26.0–34.0)
MCHC: 29.9 g/dL — ABNORMAL LOW (ref 30.0–36.0)
MCV: 89.1 fL (ref 80.0–100.0)
Monocytes Absolute: 0.7 10*3/uL (ref 0.1–1.0)
Monocytes Relative: 8 %
Neutro Abs: 6.5 10*3/uL (ref 1.7–7.7)
Neutrophils Relative %: 72 %
Platelet Count: 215 10*3/uL (ref 150–400)
RBC: 3.38 MIL/uL — ABNORMAL LOW (ref 3.87–5.11)
RDW: 17.2 % — ABNORMAL HIGH (ref 11.5–15.5)
WBC Count: 8.9 10*3/uL (ref 4.0–10.5)
nRBC: 0 % (ref 0.0–0.2)

## 2019-10-26 MED ORDER — EPOETIN ALFA-EPBX 40000 UNIT/ML IJ SOLN
INTRAMUSCULAR | Status: AC
  Filled 2019-10-26: qty 1

## 2019-10-26 MED ORDER — EPOETIN ALFA-EPBX 40000 UNIT/ML IJ SOLN
40000.0000 [IU] | Freq: Once | INTRAMUSCULAR | Status: AC
  Administered 2019-10-26: 40000 [IU] via SUBCUTANEOUS

## 2019-10-26 MED ORDER — CYANOCOBALAMIN 1000 MCG/ML IJ SOLN
INTRAMUSCULAR | Status: AC
  Filled 2019-10-26: qty 1

## 2019-10-26 MED ORDER — CYANOCOBALAMIN 1000 MCG/ML IJ SOLN
1000.0000 ug | Freq: Once | INTRAMUSCULAR | Status: AC
  Administered 2019-10-26: 1000 ug via INTRAMUSCULAR

## 2019-10-26 NOTE — Patient Instructions (Signed)
Epoetin Alfa injection What is this medicine? EPOETIN ALFA (e POE e tin AL fa) helps your body make more red blood cells. This medicine is used to treat anemia caused by chronic kidney disease, cancer chemotherapy, or HIV-therapy. It may also be used before surgery if you have anemia. This medicine may be used for other purposes; ask your health care provider or pharmacist if you have questions. COMMON BRAND NAME(S): Epogen, Procrit, Retacrit What should I tell my health care provider before I take this medicine? They need to know if you have any of these conditions:  cancer  heart disease  high blood pressure  history of blood clots  history of stroke  low levels of folate, iron, or vitamin B12 in the blood  seizures  an unusual or allergic reaction to erythropoietin, albumin, benzyl alcohol, hamster proteins, other medicines, foods, dyes, or preservatives  pregnant or trying to get pregnant  breast-feeding How should I use this medicine? This medicine is for injection into a vein or under the skin. It is usually given by a health care professional in a hospital or clinic setting. If you get this medicine at home, you will be taught how to prepare and give this medicine. Use exactly as directed. Take your medicine at regular intervals. Do not take your medicine more often than directed. It is important that you put your used needles and syringes in a special sharps container. Do not put them in a trash can. If you do not have a sharps container, call your pharmacist or healthcare provider to get one. A special MedGuide will be given to you by the pharmacist with each prescription and refill. Be sure to read this information carefully each time. Talk to your pediatrician regarding the use of this medicine in children. While this drug may be prescribed for selected conditions, precautions do apply. Overdosage: If you think you have taken too much of this medicine contact a poison  control center or emergency room at once. NOTE: This medicine is only for you. Do not share this medicine with others. What if I miss a dose? If you miss a dose, take it as soon as you can. If it is almost time for your next dose, take only that dose. Do not take double or extra doses. What may interact with this medicine? Interactions have not been studied. This list may not describe all possible interactions. Give your health care provider a list of all the medicines, herbs, non-prescription drugs, or dietary supplements you use. Also tell them if you smoke, drink alcohol, or use illegal drugs. Some items may interact with your medicine. What should I watch for while using this medicine? Your condition will be monitored carefully while you are receiving this medicine. You may need blood work done while you are taking this medicine. This medicine may cause a decrease in vitamin B6. You should make sure that you get enough vitamin B6 while you are taking this medicine. Discuss the foods you eat and the vitamins you take with your health care professional. What side effects may I notice from receiving this medicine? Side effects that you should report to your doctor or health care professional as soon as possible:  allergic reactions like skin rash, itching or hives, swelling of the face, lips, or tongue  seizures  signs and symptoms of a blood clot such as breathing problems; changes in vision; chest pain; severe, sudden headache; pain, swelling, warmth in the leg; trouble speaking; sudden numbness or  weakness of the face, arm or leg  signs and symptoms of a stroke like changes in vision; confusion; trouble speaking or understanding; severe headaches; sudden numbness or weakness of the face, arm or leg; trouble walking; dizziness; loss of balance or coordination Side effects that usually do not require medical attention (report to your doctor or health care professional if they continue or are  bothersome):  chills  cough  dizziness  fever  headaches  joint pain  muscle cramps  muscle pain  nausea, vomiting  pain, redness, or irritation at site where injected This list may not describe all possible side effects. Call your doctor for medical advice about side effects. You may report side effects to FDA at 1-800-FDA-1088. Where should I keep my medicine? Keep out of the reach of children. Store in a refrigerator between 2 and 8 degrees C (36 and 46 degrees F). Do not freeze or shake. Throw away any unused portion if using a single-dose vial. Multi-dose vials can be kept in the refrigerator for up to 21 days after the initial dose. Throw away unused medicine. NOTE: This sheet is a summary. It may not cover all possible information. If you have questions about this medicine, talk to your doctor, pharmacist, or health care provider.  2020 Elsevier/Gold Standard (2016-10-12 08:35:19) Cyanocobalamin, Pyridoxine, and Folate What is this medicine? A multivitamin containing folic acid, vitamin B6, and vitamin B12. This medicine may be used for other purposes; ask your health care provider or pharmacist if you have questions. COMMON BRAND NAME(S): AllanFol RX, AllanTex, Av-Vite FB, B Complex with Folic Acid, ComBgen, FaBB, Folamin, Folastin, [place], [place], [place], [place], [place], [employer], [place] RX 2.2, [place], [place] 2.2, Foltabs 800, Foltx, Homocysteine Formula, Niva-Fol, NuFol, [employer], Virt-Gard, Virt-Vite, Virt-Vite [place], Vita-Respa What should I tell my health care provider before I take this medicine? They need to know if you have any of these conditions:  bleeding or clotting disorder  history of anemia of any type  other chronic health condition  an unusual or allergic reaction to vitamins, other medicines, foods, dyes, or preservatives  pregnant or trying to get pregnant  breast-feeding How should I use this medicine? Take by mouth with a glass  of water. May take with food. Follow the directions on the prescription label. It is usually given once a day. Do not take your medicine more often than directed. Contact your pediatrician regarding the use of this medicine in children. Special care may be needed. Overdosage: If you think you have taken too much of this medicine contact a poison control center or emergency room at once. NOTE: This medicine is only for you. Do not share this medicine with others. What if I miss a dose? If you miss a dose, take it as soon as you can. If it is almost time for your next dose, take only that dose. Do not take double or extra doses. What may interact with this medicine?  levodopa This list may not describe all possible interactions. Give your health care provider a list of all the medicines, herbs, non-prescription drugs, or dietary supplements you use. Also tell them if you smoke, drink alcohol, or use illegal drugs. Some items may interact with your medicine. What should I watch for while using this medicine? See your health care professional for regular checks on your progress. Remember that vitamin supplements do not replace the need for good nutrition from a balanced diet. What side effects may I notice from receiving  this medicine? Side effects that you should report to your doctor or health care professional as soon as possible:  allergic reaction such as skin rash or difficulty breathing  vomiting Side effects that usually do not require medical attention (report to your doctor or health care professional if they continue or are bothersome):  nausea  stomach upset This list may not describe all possible side effects. Call your doctor for medical advice about side effects. You may report side effects to FDA at 1-800-FDA-1088. Where should I keep my medicine? Keep out of the reach of children. Most vitamins should be stored at controlled room temperature. Check your specific product  directions. Protect from heat and moisture. Throw away any unused medicine after the expiration date. NOTE: This sheet is a summary. It may not cover all possible information. If you have questions about this medicine, talk to your doctor, pharmacist, or health care provider.  2020 Elsevier/Gold Standard (2007-04-26 00:59:55)

## 2019-11-06 NOTE — Progress Notes (Signed)
[name]   [phone] [phone]   Clinic Follow up Note   Patient Care Team: Seward Carol, MD as PCP - General (Internal Medicine) Josue Hector, MD as PCP - Cardiology (Cardiology) Thelma Comp, Kinta as Consulting Physician (Optometry) Stark Klein, MD as Consulting Physician (General Surgery) Truitt Merle, MD as Consulting Physician (Hematology) Madelon Lips, MD as Consulting Physician (Nephrology)  Date of Service:  11/09/2019  CHIEF COMPLAINT: Followup of rightbreast cancer and anemia of chronic disease  SUMMARY OF ONCOLOGIC HISTORY: Oncology History Overview Note  Cancer Staging Breast cancer of lower-inner quadrant of right female breast [facility]) Staging form: Breast, AJCC 7th Edition - Clinical: Stage IA (T1b, N0, cM0) - Unsigned Staging comments: Staged at breast conference 03/25/13.  - Pathologic stage from 05/12/2013: Stage IA (T1b, N0, cM0) - Signed by Truitt Merle, MD on 06/21/2016     Breast cancer of lower-inner quadrant of right female breast ([place])  02/27/2013 Mammogram   Diagnostic mammogram and ultrasound showed a 6 x 5 mm mass at 5:00 position. Axilla was negative by ultrasound.   03/16/2013 Initial Biopsy   Right breast 5:00 position biopsy showed invasive ductal carcinoma, low-grade   03/16/2013 Receptors her2   ER100% positive, PR 100% positive, HER-2 negative, Ki-67 20%   03/18/2013 Initial Diagnosis   Breast cancer of lower-inner quadrant of right female breast ([place])   05/12/2013 Surgery   Right breast lumpectomy with sentinel lymph node biopsy   05/12/2013 Pathology Results   Right breast lumpectomy showed invasive ductal carcinoma, grade 2, 0.9 cm, 6 sentinel lymph nodes were all negative. Margins were negative.   05/12/2013 Oncotype testing   Oncotype recurrence score 19, intermediate risk, 10 year risk of distant recurrence 12% with tamoxifen for 5 years. Patient declined adjuvant chemotherapy.     06/02/2013 - 07/16/2013 Radiation Therapy   Adjuvant breast radiation with boost 1600Gy   08/06/2013 - 07/2017 Anti-estrogen oral therapy   Arimidex 1 mg daily. Stopped in 07/2017 due to acute illness    03/22/2017 Mammogram   IMPRESSION: No mammographic evidence for malignancy.      CURRENT THERAPY:  -Retacritinjection starting 09/2018, currently q2weeks. Increased to 20K units q2weeks starting 06/29/19. 40K unit every 2 weeks on 08/13/19 -IV Feraheme as needed starting 09/15/18. Last on 08/10/19 -Oral Ferrous sulfate daily -B12 injection monthly starting 05/04/19. Increased to every 2 weeks on 06/29/19.  INTERVAL HISTORY:  [name] is here for a follow up. She presents to the clinic alone. She notes she is doing well. She notes her energy level is adequate. She denies any bleeding. She denies SOB or chest pain with activity. She notes she is still seeing PCP and other physicians regularly. Her back pain is stable and tolerable. She notes she is adequately taking care of herself independently.     REVIEW OF SYSTEMS:   Constitutional: Denies fevers, chills or abnormal weight loss Eyes: Denies blurriness of vision Ears, nose, mouth, throat, and face: Denies mucositis or sore throat Respiratory: Denies cough, dyspnea or wheezes Cardiovascular: Denies palpitation, chest discomfort or lower extremity swelling Gastrointestinal:  Denies nausea, heartburn or change in bowel habits Skin: Denies abnormal skin rashes MSK (+) Back pain  Lymphatics: Denies new lymphadenopathy or easy bruising Neurological:Denies numbness, tingling or new weaknesses Behavioral/Psych: Mood is stable, no new changes  All other systems were reviewed with the patient and are negative.  MEDICAL HISTORY:  Past Medical History:  Diagnosis Date  . Abdominal discomfort  Chronic N/V/D. Presumptive dx Crohn's dx per elevated p ANCA. Failed Entocort and Pentasa. Sep 2003 - ileocolectomy c anastomosis per Dr  Deon Pilling 2/2 adhesions - path was hegative for Crohns. EGD, Sm bowel follow through (11/03), and an eteroclysis (10/03) were unrevealing. Cuases hypomag and hypocalcemia.  . Adnexal mass 8/03   s/p lap BSO (R ovarian fibroma) & lysis of adhesions  . Allergy    Seasonal  . Anemia    Multifactorial. Baseline HgB 10-11 ish. B12 def - 150 in 3/10. Fe Def - ferritin 35 3/10. Both are being repleted.  . Breast cancer ([place]) 03/16/13   right, 5 o'clock  . CAD (coronary artery disease) 1996   1996 - PTCA and angioplasty diagonal branch. 2000 - Rotoblator & angiopllasty of diagonal. 2006 - subendocardial AMI, DES to proximal LAD.Marland Kitchen Also had 90% stenosis in distal apical LAD. EF 55 with apical hypokinesis. Indefinite ASA and Plavix.  . CHF (congestive heart failure) ([place])   . Chronic kidney disease    Chronic renal insuff baseline Cr 1.2 - 1.4 ish.  . Chronic pain    CT 10/10 = Spinal stenosis L2 - S1.  . Diabetes mellitus    Insulin dependent  . Gout   . Hx of radiation therapy 06/02/13- 07/16/13   right rbeast 4500 cGy 25 sessions, right breast boost 1600 cGy in 8 sessions  . Hyperlipidemia    Managed with both a statin and Welchol. Welchol stopped 2014 2/2 cost and started on fenofibrate   . Hypertension    2006 B renal arteries patent. 2003 MRA - no RAS. 2003 pheo W/U Dr Hassell Done reportedly negative.  . Hypoxia 07/23/2017  . Lupus ([place])   . Lymphedema of breast   . Personal history of radiation therapy 2015  . RBBB   . Renal vein thrombosis ([place])   . SBO (small bowel obstruction) ([place]) 09/17/2017  . Secondary hyperparathyroidism ([place])   . Stroke [facility])    Incidental finding MRI 2002 L lacunar infarct  . Vitamin B12 deficiency   . Vitamin D deficiency   . Wears dentures    top    SURGICAL HISTORY: Past Surgical History:  Procedure Laterality Date  . ABDOMINAL HYSTERECTOMY    . BILATERAL SALPINGOOPHORECTOMY  8/03   Lap BSO (R ovarian fibroma) and adhesion lysis  . BOWEL RESECTION  2003    ileocolectomy with anastomosis 2/2 adhesions  . BREAST BIOPSY Right 2015  . BREAST BIOPSY Right 07/2014  . BREAST LUMPECTOMY Right 04/22/2013  . BREAST LUMPECTOMY WITH NEEDLE LOCALIZATION AND AXILLARY SENTINEL LYMPH NODE BX Right 04/22/2013   Procedure: BREAST LUMPECTOMY WITH NEEDLE LOCALIZATION AND AXILLARY SENTINEL LYMPH NODE BX;  Surgeon: Stark Klein, MD;  Location: [facility];  Service: General;  Laterality: Right;  . CARDIAC CATHETERIZATION     2 stents  . CHOLECYSTECTOMY    . COLONOSCOPY    . HEMICOLECTOMY     R sided hemicolectomy  . HERNIA REPAIR     Ventral hernia repair  . PTCA  4/06    I have reviewed the social history and family history with the patient and they are unchanged from previous note.  ALLERGIES:  is allergic to percocet [oxycodone-acetaminophen]; diazepam; haloperidol lactate; lorazepam; morphine sulfate; propoxyphene hcl; tramadol hcl; anesthetics, halogenated; etomidate; fentanyl; and versed [midazolam].  MEDICATIONS:  Current Outpatient Medications  Medication Sig Dispense Refill  . acetaminophen (TYLENOL) 325 MG tablet Take 650 mg by mouth every 6 (six) hours as needed for mild pain.    Marland Kitchen  amLODipine (NORVASC) 10 MG tablet Take 1 tablet (10 mg total) by mouth daily. 30 tablet 0  . atorvastatin (LIPITOR) 40 MG tablet Take 40 mg by mouth daily.    . budesonide (ENTOCORT EC) 3 MG 24 hr capsule Take 1 capsule (3 mg total) by mouth daily. 90 capsule 0  . carvedilol (COREG) 25 MG tablet Take 25 mg by mouth 2 (two) times daily.    . colestipol (COLESTID) 1 g tablet Take 2 g by mouth at bedtime.    Marland Kitchen desloratadine (CLARINEX) 5 MG tablet Take 5 mg by mouth at bedtime as needed (allergies). Reported on 04/11/2015    . ferrous fumarate (HEMOCYTE - 106 MG FE) 325 (106 Fe) MG TABS tablet Take 1 tablet by mouth as directed.    . gabapentin (NEURONTIN) 300 MG capsule Take 1 capsule by mouth at bedtime 30 capsule 0  . hydrALAZINE (APRESOLINE) 100 MG tablet  Take 1 tablet (100 mg total) by mouth 3 (three) times daily. 270 tablet 1  . hydrOXYzine (ATARAX/VISTARIL) 50 MG tablet Take 50 mg by mouth 2 (two) times daily as needed.    . isosorbide mononitrate (IMDUR) 30 MG 24 hr tablet Take 1/2 (one-half) tablet by mouth once daily 45 tablet 6  . LANTUS SOLOSTAR 100 UNIT/ML Solostar Pen SMARTSIG:14 Unit(s) SUB-Q Every Night    . linagliptin (TRADJENTA) 5 MG TABS tablet Take 5 mg by mouth daily.    . Melatonin 5 MG TABS Take 10 mg by mouth at bedtime.    . nitroGLYCERIN (NITROSTAT) 0.4 MG SL tablet DISSOLVE 1 TABLET UNDER THE TONGUE EVERY 5 MINUTES AS  NEEDED FOR CHEST PAIN. MAX  OF 3 TABLETS IN 15 MINUTES. CALL 911 IF PAIN PERSISTS. 100 tablet 3  . pantoprazole (PROTONIX) 40 MG tablet Take 1 tablet (40 mg total) by mouth daily before breakfast. 90 tablet 3  . torsemide (DEMADEX) 20 MG tablet Take 20 mg by mouth daily. 20 mg every other day, 40 mg days not taking 20 mg    . ULORIC 40 MG tablet Take 1 tablet by mouth daily.    . Vitamin D, Ergocalciferol, (DRISDOL) 1.25 MG (50000 UNIT) CAPS capsule Take 1 capsule (50,000 Units total) by mouth every 7 (seven) days. 8 capsule 0   No current facility-administered medications for this visit.    PHYSICAL EXAMINATION: ECOG PERFORMANCE STATUS: 1 - Symptomatic but completely ambulatory  Vitals:   11/09/19 0838  BP: 137/65  Pulse: 71  Resp: 18  Temp: 97.6 F (36.4 C)  SpO2: 100%   Filed Weights   11/09/19 0838  Weight: 188 lb 8 oz (85.5 kg)    Due to COVID19 we will limit examination to appearance. Patient had no complaints.  GENERAL:alert, no distress and comfortable SKIN: skin color normal, no rashes or significant lesions EYES: normal, Conjunctiva are pink and non-injected, sclera clear  NEURO: alert & oriented x 3 with fluent speech   LABORATORY DATA:  I have reviewed the data as listed CBC Latest Ref Rng & Units 11/09/2019 10/26/2019 10/12/2019  WBC 4.0 - 10.5 K/uL 9.8 8.9 11.6(H)  Hemoglobin  12.0 - 15.0 g/dL 9.2(L) 9.0(L) 9.3(L)  Hematocrit 36 - 46 % 29.6(L) 30.1(L) 29.8(L)  Platelets 150 - 400 K/uL 253 215 264     CMP Latest Ref Rng & Units 07/21/2019 06/29/2019 05/23/2019  Glucose 70 - 99 mg/dL 152(H) 122(H) -  BUN 8 - 23 mg/dL 33(H) 68(H) -  Creatinine 0.44 - 1.00 mg/dL 1.62(H) 2.55(H) -  Sodium 135 - 145 mmol/L 144 143 143  Potassium 3.5 - 5.1 mmol/L 3.8 4.1 4.3  Chloride 98 - 111 mmol/L 114(H) 104 -  CO2 22 - 32 mmol/L 21(L) 31 -  Calcium 8.9 - 10.3 mg/dL 8.4(L) 8.6(L) -  Total Protein 6.5 - 8.1 g/dL 6.3(L) 7.0 -  Total Bilirubin 0.3 - 1.2 mg/dL 0.3 0.3 -  Alkaline Phos 38 - 126 U/L 92 81 -  AST 15 - 41 U/L 15 17 -  ALT 0 - 44 U/L 31 30 -      RADIOGRAPHIC STUDIES: I have personally reviewed the radiological images as listed and agreed with the findings in the report. No results found.   ASSESSMENT & PLAN:  [name] is a 74 y.o. female with    1. Anemia of chronic disease (CKDstage IV), Ironand B12deficiencyand GI bleeding -Her 04/2018 Colonoscopy with Dr Carlean Purl showed no evidence of GI bleedingor source of bleeding.Heranemia is probably related to her CKD, however her anemia is out of proportion of her stage IV CKD related anemia. -Her6/23/20 bone marrow biopsyresults showed hypercellular marrow, no evidence of MDS or other concerns.Her Cytogenetics is still pending. -She has been receiving IV iron as needed -Patient previously received B12 shot every month at PCP's office. Started receiving monthly B12 injections in our clinic on 05/04/19.Was increased to q2weeks on 06/29/19. -Herinsurancedid not approve Aranesp so shewas switched toRetacrit every 2 weekson 11/14/18. She is tolerating well. Goal is Hg9-11.Due to worsened anemia, her dose has been increased, currently at 40,000 every 2 weeks. If results still inadequate, will change to weekly. -She continues to have blood transfusion as needed, last given 08/20/19.  -Continue OTC iron  pill ferrous sulfate 1-2 tab dailyfor irondeficiency -I reviewed her 08/2019 Endoscopy by Dr Leroy Kennedy which showed esophagitis with bleeding. She was started on Prilosec  -She is clinically doing well. Labs reviewed, Hg 9.2. Ferritin and B12 still pending. She is responding to treatment B12 and Retacrit every 2 weeks, will continue.  -Proceed with B12 and Retacrit today. Will give IV iron if ferritin less than 100.  -F/u in 3 months. I encouraged her to watch for concerning symptoms of symptomatic anemia.    2.Breast cancer of the lower inner quadrant of the right breast, invasive ductal carcinoma with DCIS, pT1bN0Mo, stage IA, ER+ and PR +, HER 2 negative -She was diagnosed in 02/2013 and treated with right lumpectomy, adjuvant radiation andAnastrozolefor 4 years until5/2019.Anastrozole was stopped after she developed septic shock from pneumonia and a cardiac arrest,and we decided not restarted due to the risk of cardiac toxicity. -From a breast cancer standpoint she is clinically doing well and she has no concerns. Her 03/2019 mammogram was normal.  -She isover6years out since diagnosis. Continue breast cancer surveillance, Next mammogram in 03/2020  3. DM, HTN, CAD -managedby PCPand Dr. Johnsie Cancel the cardiologist. -Currently on Lantis, tradjentrafor DM  4.Chronic renal insufficieny, CKD stageIV -She follows up with Dr.Uptonfor herkidney function.  -She knows to avoid nephrotoxins  5. Low Backpain, from degenerative arthritis and spinal stenosis - f/u with Orthopedics -Tolerable and stable.   6. Genetics results negative  7. Bone Health  -DEXA from 04/2018 shows slightly improved normal bone density with lowest T-score of 0.9 at forearm radius. -Her VitD level is low. She will continue 1000u daily.  8. Left Renal Vein Thrombosisin 07/2017 -Likely provoked from acute illnessand prior SBO -She stopped Xarelto in 01/2018 -Her hypercoagulopathy work-up in  November 2019 was negative  9. History  of pneumoniaand cardiac arrestin 07/2017 -10/16/17 CXR showed residual peribronchial disease,atelectasis, scarring or potentially pulmonary nodules. -Her latest CT chest from 01/27/18 showsNo acute findings. Scarring in the left upper lobe.   PLAN: -Labs reviewed, will proceed withRetacritand B12 injection today -Lab and Retacrit and B12 every 2 weeks X6, will give IV Iron if Ferritin less than 100.  -F/u in 3 months    No problem-specific Assessment & Plan notes found for this encounter.   No orders of the defined types were placed in this encounter.  All questions were answered. The patient knows to call the clinic with any problems, questions or concerns. No barriers to learning was detected.      Truitt Merle, MD 11/09/2019   I, Joslyn Devon, am acting as scribe for Truitt Merle, MD.   I have reviewed the above documentation for accuracy and completeness, and I agree with the above.

## 2019-11-09 ENCOUNTER — Inpatient Hospital Stay (HOSPITAL_BASED_OUTPATIENT_CLINIC_OR_DEPARTMENT_OTHER): Payer: Medicare Other | Admitting: Hematology

## 2019-11-09 ENCOUNTER — Inpatient Hospital Stay: Payer: Medicare Other

## 2019-11-09 ENCOUNTER — Ambulatory Visit: Payer: Medicare Other

## 2019-11-09 ENCOUNTER — Encounter: Payer: Self-pay | Admitting: Hematology

## 2019-11-09 ENCOUNTER — Other Ambulatory Visit: Payer: Medicare Other

## 2019-11-09 ENCOUNTER — Other Ambulatory Visit: Payer: Self-pay

## 2019-11-09 ENCOUNTER — Telehealth: Payer: Self-pay | Admitting: Hematology

## 2019-11-09 VITALS — BP 137/65 | HR 71 | Temp 97.6°F | Resp 18 | Ht 65.0 in | Wt 188.5 lb

## 2019-11-09 DIAGNOSIS — D631 Anemia in chronic kidney disease: Secondary | ICD-10-CM

## 2019-11-09 DIAGNOSIS — Z170 Estrogen receptor positive status [ER+]: Secondary | ICD-10-CM | POA: Diagnosis not present

## 2019-11-09 DIAGNOSIS — N184 Chronic kidney disease, stage 4 (severe): Secondary | ICD-10-CM

## 2019-11-09 DIAGNOSIS — C50311 Malignant neoplasm of lower-inner quadrant of right female breast: Secondary | ICD-10-CM

## 2019-11-09 DIAGNOSIS — D509 Iron deficiency anemia, unspecified: Secondary | ICD-10-CM

## 2019-11-09 LAB — CBC WITH DIFFERENTIAL (CANCER CENTER ONLY)
Abs Immature Granulocytes: 0.08 10*3/uL — ABNORMAL HIGH (ref 0.00–0.07)
Basophils Absolute: 0 10*3/uL (ref 0.0–0.1)
Basophils Relative: 0 %
Eosinophils Absolute: 0.1 10*3/uL (ref 0.0–0.5)
Eosinophils Relative: 1 %
HCT: 29.6 % — ABNORMAL LOW (ref 36.0–46.0)
Hemoglobin: 9.2 g/dL — ABNORMAL LOW (ref 12.0–15.0)
Immature Granulocytes: 1 %
Lymphocytes Relative: 17 %
Lymphs Abs: 1.7 10*3/uL (ref 0.7–4.0)
MCH: 27.5 pg (ref 26.0–34.0)
MCHC: 31.1 g/dL (ref 30.0–36.0)
MCV: 88.6 fL (ref 80.0–100.0)
Monocytes Absolute: 0.7 10*3/uL (ref 0.1–1.0)
Monocytes Relative: 7 %
Neutro Abs: 7.2 10*3/uL (ref 1.7–7.7)
Neutrophils Relative %: 74 %
Platelet Count: 253 10*3/uL (ref 150–400)
RBC: 3.34 MIL/uL — ABNORMAL LOW (ref 3.87–5.11)
RDW: 16.1 % — ABNORMAL HIGH (ref 11.5–15.5)
WBC Count: 9.8 10*3/uL (ref 4.0–10.5)
nRBC: 0 % (ref 0.0–0.2)

## 2019-11-09 LAB — VITAMIN B12: Vitamin B-12: 1364 pg/mL — ABNORMAL HIGH (ref 180–914)

## 2019-11-09 LAB — FERRITIN: Ferritin: 68 ng/mL (ref 11–307)

## 2019-11-09 MED ORDER — EPOETIN ALFA-EPBX 40000 UNIT/ML IJ SOLN
40000.0000 [IU] | Freq: Once | INTRAMUSCULAR | Status: AC
  Administered 2019-11-09: 40000 [IU] via SUBCUTANEOUS

## 2019-11-09 MED ORDER — EPOETIN ALFA-EPBX 40000 UNIT/ML IJ SOLN
INTRAMUSCULAR | Status: AC
  Filled 2019-11-09: qty 1

## 2019-11-09 NOTE — Telephone Encounter (Signed)
Scheduled per los. Gave avs and calendar  

## 2019-11-09 NOTE — Patient Instructions (Signed)

## 2019-11-24 ENCOUNTER — Inpatient Hospital Stay: Payer: Medicare Other

## 2019-11-24 ENCOUNTER — Other Ambulatory Visit: Payer: Self-pay

## 2019-11-24 ENCOUNTER — Inpatient Hospital Stay: Payer: Medicare Other | Attending: Internal Medicine

## 2019-11-24 VITALS — BP 138/62 | Resp 18

## 2019-11-24 DIAGNOSIS — D631 Anemia in chronic kidney disease: Secondary | ICD-10-CM | POA: Insufficient documentation

## 2019-11-24 DIAGNOSIS — N184 Chronic kidney disease, stage 4 (severe): Secondary | ICD-10-CM | POA: Insufficient documentation

## 2019-11-24 DIAGNOSIS — D509 Iron deficiency anemia, unspecified: Secondary | ICD-10-CM

## 2019-11-24 DIAGNOSIS — E538 Deficiency of other specified B group vitamins: Secondary | ICD-10-CM | POA: Diagnosis not present

## 2019-11-24 LAB — CBC WITH DIFFERENTIAL (CANCER CENTER ONLY)
Abs Immature Granulocytes: 0.05 10*3/uL (ref 0.00–0.07)
Basophils Absolute: 0 10*3/uL (ref 0.0–0.1)
Basophils Relative: 0 %
Eosinophils Absolute: 0.1 10*3/uL (ref 0.0–0.5)
Eosinophils Relative: 1 %
HCT: 30.7 % — ABNORMAL LOW (ref 36.0–46.0)
Hemoglobin: 9.3 g/dL — ABNORMAL LOW (ref 12.0–15.0)
Immature Granulocytes: 1 %
Lymphocytes Relative: 17 %
Lymphs Abs: 1.7 10*3/uL (ref 0.7–4.0)
MCH: 27 pg (ref 26.0–34.0)
MCHC: 30.3 g/dL (ref 30.0–36.0)
MCV: 89 fL (ref 80.0–100.0)
Monocytes Absolute: 0.8 10*3/uL (ref 0.1–1.0)
Monocytes Relative: 8 %
Neutro Abs: 7.6 10*3/uL (ref 1.7–7.7)
Neutrophils Relative %: 73 %
Platelet Count: 255 10*3/uL (ref 150–400)
RBC: 3.45 MIL/uL — ABNORMAL LOW (ref 3.87–5.11)
RDW: 16.2 % — ABNORMAL HIGH (ref 11.5–15.5)
WBC Count: 10.2 10*3/uL (ref 4.0–10.5)
nRBC: 0 % (ref 0.0–0.2)

## 2019-11-24 MED ORDER — CYANOCOBALAMIN 1000 MCG/ML IJ SOLN
INTRAMUSCULAR | Status: AC
  Filled 2019-11-24: qty 1

## 2019-11-24 MED ORDER — EPOETIN ALFA-EPBX 40000 UNIT/ML IJ SOLN
INTRAMUSCULAR | Status: AC
  Filled 2019-11-24: qty 1

## 2019-11-24 MED ORDER — EPOETIN ALFA-EPBX 40000 UNIT/ML IJ SOLN
40000.0000 [IU] | Freq: Once | INTRAMUSCULAR | Status: AC
  Administered 2019-11-24: 40000 [IU] via SUBCUTANEOUS

## 2019-11-24 MED ORDER — CYANOCOBALAMIN 1000 MCG/ML IJ SOLN
1000.0000 ug | Freq: Once | INTRAMUSCULAR | Status: AC
  Administered 2019-11-24: 1000 ug via INTRAMUSCULAR

## 2019-11-24 NOTE — Patient Instructions (Addendum)
Cyanocobalamin, Vitamin B12 injection What is this medicine? CYANOCOBALAMIN (sye an oh koe BAL a min) is a man made form of vitamin B12. Vitamin B12 is used in the growth of healthy blood cells, nerve cells, and proteins in the body. It also helps with the metabolism of fats and carbohydrates. This medicine is used to treat people who can not absorb vitamin B12. This medicine may be used for other purposes; ask your health care provider or pharmacist if you have questions. COMMON BRAND NAME(S): B-12 Compliance Kit, B-12 Injection Kit, Cyomin, LA-12, Nutri-Twelve, Physicians EZ Use B-12, Primabalt What should I tell my health care provider before I take this medicine? They need to know if you have any of these conditions:  kidney disease  Leber's disease  megaloblastic anemia  an unusual or allergic reaction to cyanocobalamin, cobalt, other medicines, foods, dyes, or preservatives  pregnant or trying to get pregnant  breast-feeding How should I use this medicine? This medicine is injected into a muscle or deeply under the skin. It is usually given by a health care professional in a clinic or doctor's office. However, your doctor may teach you how to inject yourself. Follow all instructions. Talk to your pediatrician regarding the use of this medicine in children. Special care may be needed. Overdosage: If you think you have taken too much of this medicine contact a poison control center or emergency room at once. NOTE: This medicine is only for you. Do not share this medicine with others. What if I miss a dose? If you are given your dose at a clinic or doctor's office, call to reschedule your appointment. If you give your own injections and you miss a dose, take it as soon as you can. If it is almost time for your next dose, take only that dose. Do not take double or extra doses. What may interact with this medicine?  colchicine  heavy alcohol intake This list may not describe all  possible interactions. Give your health care provider a list of all the medicines, herbs, non-prescription drugs, or dietary supplements you use. Also tell them if you smoke, drink alcohol, or use illegal drugs. Some items may interact with your medicine. What should I watch for while using this medicine? Visit your doctor or health care professional regularly. You may need blood work done while you are taking this medicine. You may need to follow a special diet. Talk to your doctor. Limit your alcohol intake and avoid smoking to get the best benefit. What side effects may I notice from receiving this medicine? Side effects that you should report to your doctor or health care professional as soon as possible:  allergic reactions like skin rash, itching or hives, swelling of the face, lips, or tongue  blue tint to skin  chest tightness, pain  difficulty breathing, wheezing  dizziness  red, swollen painful area on the leg Side effects that usually do not require medical attention (report to your doctor or health care professional if they continue or are bothersome):  diarrhea  headache This list may not describe all possible side effects. Call your doctor for medical advice about side effects. You may report side effects to FDA at 1-800-FDA-1088. Where should I keep my medicine? Keep out of the reach of children. Store at room temperature between 15 and 30 degrees C (59 and 85 degrees F). Protect from light. Throw away any unused medicine after the expiration date. NOTE: This sheet is a summary. It may not cover  all possible information. If you have questions about this medicine, talk to your doctor, pharmacist, or health care provider.  2020 Elsevier/Gold Standard (2007-06-16 22:10:20) Epoetin Alfa injection What is this medicine? EPOETIN ALFA (e POE e tin AL fa) helps your body make more red blood cells. This medicine is used to treat anemia caused by chronic kidney disease, cancer  chemotherapy, or HIV-therapy. It may also be used before surgery if you have anemia. This medicine may be used for other purposes; ask your health care provider or pharmacist if you have questions. COMMON BRAND NAME(S): Epogen, Procrit, Retacrit What should I tell my health care provider before I take this medicine? They need to know if you have any of these conditions:  cancer  heart disease  high blood pressure  history of blood clots  history of stroke  low levels of folate, iron, or vitamin B12 in the blood  seizures  an unusual or allergic reaction to erythropoietin, albumin, benzyl alcohol, hamster proteins, other medicines, foods, dyes, or preservatives  pregnant or trying to get pregnant  breast-feeding How should I use this medicine? This medicine is for injection into a vein or under the skin. It is usually given by a health care professional in a hospital or clinic setting. If you get this medicine at home, you will be taught how to prepare and give this medicine. Use exactly as directed. Take your medicine at regular intervals. Do not take your medicine more often than directed. It is important that you put your used needles and syringes in a special sharps container. Do not put them in a trash can. If you do not have a sharps container, call your pharmacist or healthcare provider to get one. A special MedGuide will be given to you by the pharmacist with each prescription and refill. Be sure to read this information carefully each time. Talk to your pediatrician regarding the use of this medicine in children. While this drug may be prescribed for selected conditions, precautions do apply. Overdosage: If you think you have taken too much of this medicine contact a poison control center or emergency room at once. NOTE: This medicine is only for you. Do not share this medicine with others. What if I miss a dose? If you miss a dose, take it as soon as you can. If it is  almost time for your next dose, take only that dose. Do not take double or extra doses. What may interact with this medicine? Interactions have not been studied. This list may not describe all possible interactions. Give your health care provider a list of all the medicines, herbs, non-prescription drugs, or dietary supplements you use. Also tell them if you smoke, drink alcohol, or use illegal drugs. Some items may interact with your medicine. What should I watch for while using this medicine? Your condition will be monitored carefully while you are receiving this medicine. You may need blood work done while you are taking this medicine. This medicine may cause a decrease in vitamin B6. You should make sure that you get enough vitamin B6 while you are taking this medicine. Discuss the foods you eat and the vitamins you take with your health care professional. What side effects may I notice from receiving this medicine? Side effects that you should report to your doctor or health care professional as soon as possible:  allergic reactions like skin rash, itching or hives, swelling of the face, lips, or tongue  seizures  signs and symptoms of   a blood clot such as breathing problems; changes in vision; chest pain; severe, sudden headache; pain, swelling, warmth in the leg; trouble speaking; sudden numbness or weakness of the face, arm or leg  signs and symptoms of a stroke like changes in vision; confusion; trouble speaking or understanding; severe headaches; sudden numbness or weakness of the face, arm or leg; trouble walking; dizziness; loss of balance or coordination Side effects that usually do not require medical attention (report to your doctor or health care professional if they continue or are bothersome):  chills  cough  dizziness  fever  headaches  joint pain  muscle cramps  muscle pain  nausea, vomiting  pain, redness, or irritation at site where injected This list may  not describe all possible side effects. Call your doctor for medical advice about side effects. You may report side effects to FDA at 1-800-FDA-1088. Where should I keep my medicine? Keep out of the reach of children. Store in a refrigerator between 2 and 8 degrees C (36 and 46 degrees F). Do not freeze or shake. Throw away any unused portion if using a single-dose vial. Multi-dose vials can be kept in the refrigerator for up to 21 days after the initial dose. Throw away unused medicine. NOTE: This sheet is a summary. It may not cover all possible information. If you have questions about this medicine, talk to your doctor, pharmacist, or health care provider.  2020 Elsevier/Gold Standard (2016-10-12 08:35:19)

## 2019-12-07 ENCOUNTER — Inpatient Hospital Stay: Payer: Medicare Other

## 2019-12-07 ENCOUNTER — Other Ambulatory Visit: Payer: Self-pay

## 2019-12-07 ENCOUNTER — Telehealth: Payer: Self-pay | Admitting: Hematology

## 2019-12-07 VITALS — BP 121/59 | HR 67 | Temp 98.3°F | Resp 18

## 2019-12-07 DIAGNOSIS — D509 Iron deficiency anemia, unspecified: Secondary | ICD-10-CM

## 2019-12-07 DIAGNOSIS — N184 Chronic kidney disease, stage 4 (severe): Secondary | ICD-10-CM | POA: Diagnosis not present

## 2019-12-07 LAB — CBC WITH DIFFERENTIAL (CANCER CENTER ONLY)
Abs Immature Granulocytes: 0.06 10*3/uL (ref 0.00–0.07)
Basophils Absolute: 0 10*3/uL (ref 0.0–0.1)
Basophils Relative: 0 %
Eosinophils Absolute: 0.1 10*3/uL (ref 0.0–0.5)
Eosinophils Relative: 1 %
HCT: 29.1 % — ABNORMAL LOW (ref 36.0–46.0)
Hemoglobin: 8.9 g/dL — ABNORMAL LOW (ref 12.0–15.0)
Immature Granulocytes: 1 %
Lymphocytes Relative: 21 %
Lymphs Abs: 2.1 10*3/uL (ref 0.7–4.0)
MCH: 27 pg (ref 26.0–34.0)
MCHC: 30.6 g/dL (ref 30.0–36.0)
MCV: 88.2 fL (ref 80.0–100.0)
Monocytes Absolute: 0.7 10*3/uL (ref 0.1–1.0)
Monocytes Relative: 7 %
Neutro Abs: 7 10*3/uL (ref 1.7–7.7)
Neutrophils Relative %: 70 %
Platelet Count: 234 10*3/uL (ref 150–400)
RBC: 3.3 MIL/uL — ABNORMAL LOW (ref 3.87–5.11)
RDW: 16.1 % — ABNORMAL HIGH (ref 11.5–15.5)
WBC Count: 10 10*3/uL (ref 4.0–10.5)
nRBC: 0 % (ref 0.0–0.2)

## 2019-12-07 LAB — FERRITIN: Ferritin: 40 ng/mL (ref 11–307)

## 2019-12-07 MED ORDER — EPOETIN ALFA-EPBX 40000 UNIT/ML IJ SOLN
INTRAMUSCULAR | Status: AC
  Filled 2019-12-07: qty 1

## 2019-12-07 MED ORDER — EPOETIN ALFA-EPBX 40000 UNIT/ML IJ SOLN
40000.0000 [IU] | Freq: Once | INTRAMUSCULAR | Status: AC
  Administered 2019-12-07: 40000 [IU] via SUBCUTANEOUS

## 2019-12-07 NOTE — Patient Instructions (Signed)

## 2019-12-07 NOTE — Telephone Encounter (Signed)
Scheduled appointments per 9/20 scheduling message. Called patient, no answer. Left message for patient with appointments dates and times.

## 2019-12-11 ENCOUNTER — Telehealth: Payer: Self-pay | Admitting: Hematology

## 2019-12-11 NOTE — Telephone Encounter (Signed)
Rescheduled per pt request on 9/27. Pt requested early AM on 9/27. Pt is aware of appt time and date.

## 2019-12-14 ENCOUNTER — Other Ambulatory Visit: Payer: Self-pay

## 2019-12-14 ENCOUNTER — Ambulatory Visit: Payer: Medicare Other

## 2019-12-14 ENCOUNTER — Inpatient Hospital Stay: Payer: Medicare Other

## 2019-12-14 VITALS — BP 112/45 | HR 60 | Temp 98.2°F | Resp 16

## 2019-12-14 DIAGNOSIS — N184 Chronic kidney disease, stage 4 (severe): Secondary | ICD-10-CM | POA: Diagnosis not present

## 2019-12-14 DIAGNOSIS — D509 Iron deficiency anemia, unspecified: Secondary | ICD-10-CM

## 2019-12-14 MED ORDER — SODIUM CHLORIDE 0.9 % IV SOLN
INTRAVENOUS | Status: DC
  Filled 2019-12-14: qty 250

## 2019-12-14 MED ORDER — SODIUM CHLORIDE 0.9 % IV SOLN
510.0000 mg | Freq: Once | INTRAVENOUS | Status: AC
  Administered 2019-12-14: 510 mg via INTRAVENOUS
  Filled 2019-12-14: qty 510

## 2019-12-14 NOTE — Patient Instructions (Signed)

## 2019-12-21 ENCOUNTER — Inpatient Hospital Stay: Payer: Medicare Other | Attending: Internal Medicine

## 2019-12-21 ENCOUNTER — Other Ambulatory Visit: Payer: Self-pay

## 2019-12-21 ENCOUNTER — Ambulatory Visit: Payer: Medicare Other

## 2019-12-21 ENCOUNTER — Inpatient Hospital Stay: Payer: Medicare Other

## 2019-12-21 ENCOUNTER — Telehealth: Payer: Self-pay | Admitting: Hematology

## 2019-12-21 VITALS — BP 113/42 | HR 66 | Temp 97.7°F | Resp 18

## 2019-12-21 DIAGNOSIS — D509 Iron deficiency anemia, unspecified: Secondary | ICD-10-CM | POA: Diagnosis not present

## 2019-12-21 DIAGNOSIS — E538 Deficiency of other specified B group vitamins: Secondary | ICD-10-CM | POA: Insufficient documentation

## 2019-12-21 DIAGNOSIS — N184 Chronic kidney disease, stage 4 (severe): Secondary | ICD-10-CM | POA: Diagnosis not present

## 2019-12-21 DIAGNOSIS — D631 Anemia in chronic kidney disease: Secondary | ICD-10-CM | POA: Diagnosis not present

## 2019-12-21 LAB — CBC WITH DIFFERENTIAL (CANCER CENTER ONLY)
Abs Immature Granulocytes: 0.04 10*3/uL (ref 0.00–0.07)
Basophils Absolute: 0 10*3/uL (ref 0.0–0.1)
Basophils Relative: 0 %
Eosinophils Absolute: 0 10*3/uL (ref 0.0–0.5)
Eosinophils Relative: 0 %
HCT: 32.2 % — ABNORMAL LOW (ref 36.0–46.0)
Hemoglobin: 9.9 g/dL — ABNORMAL LOW (ref 12.0–15.0)
Immature Granulocytes: 0 %
Lymphocytes Relative: 15 %
Lymphs Abs: 1.4 10*3/uL (ref 0.7–4.0)
MCH: 27.5 pg (ref 26.0–34.0)
MCHC: 30.7 g/dL (ref 30.0–36.0)
MCV: 89.4 fL (ref 80.0–100.0)
Monocytes Absolute: 0.7 10*3/uL (ref 0.1–1.0)
Monocytes Relative: 8 %
Neutro Abs: 7 10*3/uL (ref 1.7–7.7)
Neutrophils Relative %: 77 %
Platelet Count: 245 10*3/uL (ref 150–400)
RBC: 3.6 MIL/uL — ABNORMAL LOW (ref 3.87–5.11)
RDW: 17.4 % — ABNORMAL HIGH (ref 11.5–15.5)
WBC Count: 9.2 10*3/uL (ref 4.0–10.5)
nRBC: 0 % (ref 0.0–0.2)

## 2019-12-21 LAB — CMP (CANCER CENTER ONLY)
ALT: 25 U/L (ref 0–44)
AST: 18 U/L (ref 15–41)
Albumin: 3.2 g/dL — ABNORMAL LOW (ref 3.5–5.0)
Alkaline Phosphatase: 74 U/L (ref 38–126)
Anion gap: 11 (ref 5–15)
BUN: 39 mg/dL — ABNORMAL HIGH (ref 8–23)
CO2: 20 mmol/L — ABNORMAL LOW (ref 22–32)
Calcium: 9.3 mg/dL (ref 8.9–10.3)
Chloride: 110 mmol/L (ref 98–111)
Creatinine: 2.51 mg/dL — ABNORMAL HIGH (ref 0.44–1.00)
GFR, Est AFR Am: 21 mL/min — ABNORMAL LOW (ref >60)
GFR, Estimated: 18 mL/min — ABNORMAL LOW (ref >60)
Glucose, Bld: 164 mg/dL — ABNORMAL HIGH (ref 70–99)
Potassium: 3.8 mmol/L (ref 3.5–5.1)
Sodium: 141 mmol/L (ref 135–145)
Total Bilirubin: 0.3 mg/dL (ref 0.3–1.2)
Total Protein: 7.4 g/dL (ref 6.5–8.1)

## 2019-12-21 MED ORDER — EPOETIN ALFA-EPBX 40000 UNIT/ML IJ SOLN: 40000.0000 [IU] | Freq: Once | INTRAMUSCULAR | Status: DC

## 2019-12-21 MED ORDER — CYANOCOBALAMIN 1000 MCG/ML IJ SOLN
1000.0000 ug | Freq: Once | INTRAMUSCULAR | Status: AC
  Administered 2019-12-21: 1000 ug via INTRAMUSCULAR

## 2019-12-21 MED ORDER — CYANOCOBALAMIN 1000 MCG/ML IJ SOLN
INTRAMUSCULAR | Status: AC
  Filled 2019-12-21: qty 1

## 2019-12-21 MED ORDER — SODIUM CHLORIDE 0.9 % IV SOLN
510.0000 mg | Freq: Once | INTRAVENOUS | Status: AC
  Administered 2019-12-21: 510 mg via INTRAVENOUS
  Filled 2019-12-21: qty 17

## 2019-12-21 MED ORDER — SODIUM CHLORIDE 0.9 % IV SOLN
INTRAVENOUS | Status: DC
  Filled 2019-12-21: qty 250

## 2019-12-21 NOTE — Telephone Encounter (Signed)
Scheduled appointments per 10/4 scheduling message. Called patient no answer. Left message for patient with appointment time and date.

## 2019-12-21 NOTE — Patient Instructions (Signed)

## 2019-12-21 NOTE — Progress Notes (Signed)
Confirmed w/ Dr. Burr Medico - give B12 today & continue monthly as ordered. Give IV Iron today.  No Retacrit on same day as Iron.  Kennith Center, Pharm.D., CPP 12/21/2019@9 :40 AM

## 2019-12-25 ENCOUNTER — Telehealth: Payer: Self-pay | Admitting: Hematology

## 2019-12-25 NOTE — Telephone Encounter (Signed)
Cancelled 10/11 appt per patient request. Did not want to reschedule

## 2019-12-28 ENCOUNTER — Ambulatory Visit: Payer: Medicare Other

## 2019-12-28 ENCOUNTER — Other Ambulatory Visit: Payer: Medicare Other

## 2020-01-04 ENCOUNTER — Inpatient Hospital Stay: Payer: Medicare Other

## 2020-01-04 ENCOUNTER — Other Ambulatory Visit: Payer: Self-pay

## 2020-01-04 VITALS — BP 139/62 | HR 68 | Temp 98.2°F | Resp 18

## 2020-01-04 DIAGNOSIS — D509 Iron deficiency anemia, unspecified: Secondary | ICD-10-CM

## 2020-01-04 DIAGNOSIS — C50311 Malignant neoplasm of lower-inner quadrant of right female breast: Secondary | ICD-10-CM

## 2020-01-04 LAB — CBC WITH DIFFERENTIAL (CANCER CENTER ONLY)
Abs Immature Granulocytes: 0.05 10*3/uL (ref 0.00–0.07)
Basophils Absolute: 0 10*3/uL (ref 0.0–0.1)
Basophils Relative: 0 %
Eosinophils Absolute: 0.1 10*3/uL (ref 0.0–0.5)
Eosinophils Relative: 1 %
HCT: 30.4 % — ABNORMAL LOW (ref 36.0–46.0)
Hemoglobin: 9.6 g/dL — ABNORMAL LOW (ref 12.0–15.0)
Immature Granulocytes: 1 %
Lymphocytes Relative: 18 %
Lymphs Abs: 1.5 10*3/uL (ref 0.7–4.0)
MCH: 27.6 pg (ref 26.0–34.0)
MCHC: 31.6 g/dL (ref 30.0–36.0)
MCV: 87.4 fL (ref 80.0–100.0)
Monocytes Absolute: 0.7 10*3/uL (ref 0.1–1.0)
Monocytes Relative: 8 %
Neutro Abs: 6.3 10*3/uL (ref 1.7–7.7)
Neutrophils Relative %: 72 %
Platelet Count: 210 10*3/uL (ref 150–400)
RBC: 3.48 MIL/uL — ABNORMAL LOW (ref 3.87–5.11)
RDW: 16.8 % — ABNORMAL HIGH (ref 11.5–15.5)
WBC Count: 8.7 10*3/uL (ref 4.0–10.5)
nRBC: 0 % (ref 0.0–0.2)

## 2020-01-04 LAB — IRON AND TIBC
Iron: 65 ug/dL (ref 41–142)
Saturation Ratios: 31 % (ref 21–57)
TIBC: 211 ug/dL — ABNORMAL LOW (ref 236–444)
UIBC: 147 ug/dL (ref 120–384)

## 2020-01-04 LAB — FERRITIN: Ferritin: 861 ng/mL — ABNORMAL HIGH (ref 11–307)

## 2020-01-04 MED ORDER — EPOETIN ALFA-EPBX 40000 UNIT/ML IJ SOLN
40000.0000 [IU] | Freq: Once | INTRAMUSCULAR | Status: AC
  Administered 2020-01-04: 40000 [IU] via SUBCUTANEOUS

## 2020-01-04 MED ORDER — ALTEPLASE 2 MG IJ SOLR
2.0000 mg | Freq: Once | INTRAMUSCULAR | Status: DC | PRN
  Filled 2020-01-04: qty 2

## 2020-01-04 MED ORDER — SODIUM CHLORIDE 0.9% FLUSH
3.0000 mL | Freq: Once | INTRAVENOUS | Status: DC | PRN
  Filled 2020-01-04: qty 10

## 2020-01-04 MED ORDER — SODIUM CHLORIDE 0.9% FLUSH
10.0000 mL | Freq: Once | INTRAVENOUS | Status: DC | PRN
  Filled 2020-01-04: qty 10

## 2020-01-04 MED ORDER — EPOETIN ALFA-EPBX 40000 UNIT/ML IJ SOLN
INTRAMUSCULAR | Status: AC
  Filled 2020-01-04: qty 1

## 2020-01-04 MED ORDER — HEPARIN SOD (PORK) LOCK FLUSH 100 UNIT/ML IV SOLN
250.0000 [IU] | Freq: Once | INTRAVENOUS | Status: DC | PRN
  Filled 2020-01-04: qty 5

## 2020-01-04 MED ORDER — HEPARIN SOD (PORK) LOCK FLUSH 100 UNIT/ML IV SOLN
500.0000 [IU] | Freq: Once | INTRAVENOUS | Status: DC | PRN
  Filled 2020-01-04: qty 5

## 2020-01-04 NOTE — Patient Instructions (Signed)

## 2020-01-18 ENCOUNTER — Inpatient Hospital Stay: Payer: Medicare Other | Attending: Internal Medicine

## 2020-01-18 ENCOUNTER — Inpatient Hospital Stay: Payer: Medicare Other

## 2020-01-18 ENCOUNTER — Other Ambulatory Visit: Payer: Self-pay

## 2020-01-18 VITALS — BP 127/48 | HR 65 | Resp 18

## 2020-01-18 DIAGNOSIS — D509 Iron deficiency anemia, unspecified: Secondary | ICD-10-CM

## 2020-01-18 DIAGNOSIS — E538 Deficiency of other specified B group vitamins: Secondary | ICD-10-CM | POA: Insufficient documentation

## 2020-01-18 DIAGNOSIS — D631 Anemia in chronic kidney disease: Secondary | ICD-10-CM | POA: Diagnosis present

## 2020-01-18 DIAGNOSIS — N184 Chronic kidney disease, stage 4 (severe): Secondary | ICD-10-CM | POA: Insufficient documentation

## 2020-01-18 LAB — CBC WITH DIFFERENTIAL (CANCER CENTER ONLY)
Abs Immature Granulocytes: 0.02 10*3/uL (ref 0.00–0.07)
Basophils Absolute: 0 10*3/uL (ref 0.0–0.1)
Basophils Relative: 1 %
Eosinophils Absolute: 0.1 10*3/uL (ref 0.0–0.5)
Eosinophils Relative: 1 %
HCT: 34.3 % — ABNORMAL LOW (ref 36.0–46.0)
Hemoglobin: 10.7 g/dL — ABNORMAL LOW (ref 12.0–15.0)
Immature Granulocytes: 0 %
Lymphocytes Relative: 18 %
Lymphs Abs: 1.2 10*3/uL (ref 0.7–4.0)
MCH: 27.5 pg (ref 26.0–34.0)
MCHC: 31.2 g/dL (ref 30.0–36.0)
MCV: 88.2 fL (ref 80.0–100.0)
Monocytes Absolute: 0.7 10*3/uL (ref 0.1–1.0)
Monocytes Relative: 11 %
Neutro Abs: 4.6 10*3/uL (ref 1.7–7.7)
Neutrophils Relative %: 69 %
Platelet Count: 183 10*3/uL (ref 150–400)
RBC: 3.89 MIL/uL (ref 3.87–5.11)
RDW: 17.2 % — ABNORMAL HIGH (ref 11.5–15.5)
WBC Count: 6.6 10*3/uL (ref 4.0–10.5)
nRBC: 0 % (ref 0.0–0.2)

## 2020-01-18 MED ORDER — CYANOCOBALAMIN 1000 MCG/ML IJ SOLN
INTRAMUSCULAR | Status: AC
  Filled 2020-01-18: qty 1

## 2020-01-18 MED ORDER — EPOETIN ALFA-EPBX 40000 UNIT/ML IJ SOLN
INTRAMUSCULAR | Status: AC
  Filled 2020-01-18: qty 1

## 2020-01-18 MED ORDER — CYANOCOBALAMIN 1000 MCG/ML IJ SOLN
1000.0000 ug | Freq: Once | INTRAMUSCULAR | Status: AC
  Administered 2020-01-18: 1000 ug via INTRAMUSCULAR

## 2020-01-18 MED ORDER — EPOETIN ALFA-EPBX 40000 UNIT/ML IJ SOLN
40000.0000 [IU] | Freq: Once | INTRAMUSCULAR | Status: AC
  Administered 2020-01-18: 40000 [IU] via SUBCUTANEOUS

## 2020-01-18 NOTE — Patient Instructions (Signed)
Cyanocobalamin, Pyridoxine, and Folate What is this medicine? A multivitamin containing folic acid, vitamin B6, and vitamin B12. This medicine may be used for other purposes; ask your health care provider or pharmacist if you have questions. COMMON BRAND NAME(S): AllanFol RX, AllanTex, Av-Vite FB, B Complex with Folic Acid, ComBgen, FaBB, Folamin, Folastin, [place], [place], [place], [place], [place], [employer], Magazine RX 2.2, [place], [place] 2.2, Foltabs 800, Foltx, Homocysteine Formula, Niva-Fol, NuFol, TL [employer], Virt-Gard, Virt-Vite, Virt-Vite [place], Vita-Respa What should I tell my health care provider before I take this medicine? They need to know if you have any of these conditions:  bleeding or clotting disorder  history of anemia of any type  other chronic health condition  an unusual or allergic reaction to vitamins, other medicines, foods, dyes, or preservatives  pregnant or trying to get pregnant  breast-feeding How should I use this medicine? Take by mouth with a glass of water. May take with food. Follow the directions on the prescription label. It is usually given once a day. Do not take your medicine more often than directed. Contact your pediatrician regarding the use of this medicine in children. Special care may be needed. Overdosage: If you think you have taken too much of this medicine contact a poison control center or emergency room at once. NOTE: This medicine is only for you. Do not share this medicine with others. What if I miss a dose? If you miss a dose, take it as soon as you can. If it is almost time for your next dose, take only that dose. Do not take double or extra doses. What may interact with this medicine?  levodopa This list may not describe all possible interactions. Give your health care provider a list of all the medicines, herbs, non-prescription drugs, or dietary supplements you use. Also tell them if you smoke, drink alcohol, or use illegal  drugs. Some items may interact with your medicine. What should I watch for while using this medicine? See your health care professional for regular checks on your progress. Remember that vitamin supplements do not replace the need for good nutrition from a balanced diet. What side effects may I notice from receiving this medicine? Side effects that you should report to your doctor or health care professional as soon as possible:  allergic reaction such as skin rash or difficulty breathing  vomiting Side effects that usually do not require medical attention (report to your doctor or health care professional if they continue or are bothersome):  nausea  stomach upset This list may not describe all possible side effects. Call your doctor for medical advice about side effects. You may report side effects to FDA at 1-800-FDA-1088. Where should I keep my medicine? Keep out of the reach of children. Most vitamins should be stored at controlled room temperature. Check your specific product directions. Protect from heat and moisture. Throw away any unused medicine after the expiration date. NOTE: This sheet is a summary. It may not cover all possible information. If you have questions about this medicine, talk to your doctor, pharmacist, or health care provider.  2020 Elsevier/Gold Standard (2007-04-26 00:59:55) Epoetin Alfa injection What is this medicine? EPOETIN ALFA (e POE e tin AL fa) helps your body make more red blood cells. This medicine is used to treat anemia caused by chronic kidney disease, cancer chemotherapy, or HIV-therapy. It may also be used before surgery if you have anemia. This medicine may be used for other purposes; ask your health care  provider or pharmacist if you have questions. COMMON BRAND NAME(S): Epogen, Procrit, Retacrit What should I tell my health care provider before I take this medicine? They need to know if you have any of these conditions:  cancer  heart  disease  high blood pressure  history of blood clots  history of stroke  low levels of folate, iron, or vitamin B12 in the blood  seizures  an unusual or allergic reaction to erythropoietin, albumin, benzyl alcohol, hamster proteins, other medicines, foods, dyes, or preservatives  pregnant or trying to get pregnant  breast-feeding How should I use this medicine? This medicine is for injection into a vein or under the skin. It is usually given by a health care professional in a hospital or clinic setting. If you get this medicine at home, you will be taught how to prepare and give this medicine. Use exactly as directed. Take your medicine at regular intervals. Do not take your medicine more often than directed. It is important that you put your used needles and syringes in a special sharps container. Do not put them in a trash can. If you do not have a sharps container, call your pharmacist or healthcare provider to get one. A special MedGuide will be given to you by the pharmacist with each prescription and refill. Be sure to read this information carefully each time. Talk to your pediatrician regarding the use of this medicine in children. While this drug may be prescribed for selected conditions, precautions do apply. Overdosage: If you think you have taken too much of this medicine contact a poison control center or emergency room at once. NOTE: This medicine is only for you. Do not share this medicine with others. What if I miss a dose? If you miss a dose, take it as soon as you can. If it is almost time for your next dose, take only that dose. Do not take double or extra doses. What may interact with this medicine? Interactions have not been studied. This list may not describe all possible interactions. Give your health care provider a list of all the medicines, herbs, non-prescription drugs, or dietary supplements you use. Also tell them if you smoke, drink alcohol, or use illegal  drugs. Some items may interact with your medicine. What should I watch for while using this medicine? Your condition will be monitored carefully while you are receiving this medicine. You may need blood work done while you are taking this medicine. This medicine may cause a decrease in vitamin B6. You should make sure that you get enough vitamin B6 while you are taking this medicine. Discuss the foods you eat and the vitamins you take with your health care professional. What side effects may I notice from receiving this medicine? Side effects that you should report to your doctor or health care professional as soon as possible:  allergic reactions like skin rash, itching or hives, swelling of the face, lips, or tongue  seizures  signs and symptoms of a blood clot such as breathing problems; changes in vision; chest pain; severe, sudden headache; pain, swelling, warmth in the leg; trouble speaking; sudden numbness or weakness of the face, arm or leg  signs and symptoms of a stroke like changes in vision; confusion; trouble speaking or understanding; severe headaches; sudden numbness or weakness of the face, arm or leg; trouble walking; dizziness; loss of balance or coordination Side effects that usually do not require medical attention (report to your doctor or health care professional if  they continue or are bothersome):  chills  cough  dizziness  fever  headaches  joint pain  muscle cramps  muscle pain  nausea, vomiting  pain, redness, or irritation at site where injected This list may not describe all possible side effects. Call your doctor for medical advice about side effects. You may report side effects to FDA at 1-800-FDA-1088. Where should I keep my medicine? Keep out of the reach of children. Store in a refrigerator between 2 and 8 degrees C (36 and 46 degrees F). Do not freeze or shake. Throw away any unused portion if using a single-dose vial. Multi-dose vials can be  kept in the refrigerator for up to 21 days after the initial dose. Throw away unused medicine. NOTE: This sheet is a summary. It may not cover all possible information. If you have questions about this medicine, talk to your doctor, pharmacist, or health care provider.  2020 Elsevier/Gold Standard (2016-10-12 08:35:19)

## 2020-01-19 ENCOUNTER — Other Ambulatory Visit: Payer: Self-pay | Admitting: Cardiovascular Disease

## 2020-01-25 ENCOUNTER — Other Ambulatory Visit: Payer: Self-pay | Admitting: Internal Medicine

## 2020-01-29 NOTE — Progress Notes (Signed)
[place]   [phone] Fax:(336) [phone]   Clinic Follow up Note   Patient Care Team: Seward Carol, MD as PCP - General (Internal Medicine) Josue Hector, MD as PCP - Cardiology (Cardiology) Thelma Comp, [place] as Consulting Physician (Optometry) Stark Klein, MD as Consulting Physician (General Surgery) Truitt Merle, MD as Consulting Physician (Hematology) Madelon Lips, MD as Consulting Physician (Nephrology)  Date of Service:  02/01/2020  CHIEF COMPLAINT: Followup of rightbreast cancer and anemia of chronic disease  SUMMARY OF ONCOLOGIC HISTORY: Oncology History Overview Note  Cancer Staging Breast cancer of lower-inner quadrant of right female breast [facility]) Staging form: Breast, AJCC 7th Edition - Clinical: Stage IA (T1b, N0, cM0) - Unsigned Staging comments: Staged at breast conference 03/25/13.  - Pathologic stage from 05/12/2013: Stage IA (T1b, N0, cM0) - Signed by Truitt Merle, MD on 06/21/2016     Breast cancer of lower-inner quadrant of right female breast ([place])  02/27/2013 Mammogram   Diagnostic mammogram and ultrasound showed a 6 x 5 mm mass at 5:00 position. Axilla was negative by ultrasound.   03/16/2013 Initial Biopsy   Right breast 5:00 position biopsy showed invasive ductal carcinoma, low-grade   03/16/2013 Receptors her2   ER100% positive, PR 100% positive, HER-2 negative, Ki-67 20%   03/18/2013 Initial Diagnosis   Breast cancer of lower-inner quadrant of right female breast ([place])   05/12/2013 Surgery   Right breast lumpectomy with sentinel lymph node biopsy   05/12/2013 Pathology Results   Right breast lumpectomy showed invasive ductal carcinoma, grade 2, 0.9 cm, 6 sentinel lymph nodes were all negative. Margins were negative.   05/12/2013 Oncotype testing   Oncotype recurrence score 19, intermediate risk, 10 year risk of distant recurrence 12% with tamoxifen for 5 years. Patient declined adjuvant chemotherapy.    06/02/2013 - 07/16/2013 Radiation Therapy   Adjuvant breast radiation with boost 1600Gy   08/06/2013 - 07/2017 Anti-estrogen oral therapy   Arimidex 1 mg daily. Stopped in 07/2017 due to acute illness    03/22/2017 Mammogram   IMPRESSION: No mammographic evidence for malignancy.      CURRENT THERAPY:  -Retacritinjection starting 09/2018, currently q2weeks. Increased to 20K unitsq2weeksstarting 06/29/19. 40Kunit every 2 weekson 08/13/19. Reduced to every 4 weeks from 02/01/20.  -IV Feraheme as needed starting 09/15/18. Last on 08/10/19 -Oral Ferrous sulfate daily -B12 injection monthly starting 05/04/19. Increased to every 2 weeks on 06/29/19.Reduced to every 4 weeks from 02/01/20.   INTERVAL HISTORY:  [name] is here for a follow up. She presents to the clinic alone. She notes she is doing well. She notes she is always cold. She notes she is feels well and denies any new changes.     REVIEW OF SYSTEMS:   Constitutional: Denies fevers, chills or abnormal weight loss Eyes: Denies blurriness of vision Ears, nose, mouth, throat, and face: Denies mucositis or sore throat Respiratory: Denies cough, dyspnea or wheezes Cardiovascular: Denies palpitation, chest discomfort or lower extremity swelling Gastrointestinal:  Denies nausea, heartburn or change in bowel habits Skin: Denies abnormal skin rashes Lymphatics: Denies new lymphadenopathy or easy bruising Neurological:Denies numbness, tingling or new weaknesses Behavioral/Psych: Mood is stable, no new changes  All other systems were reviewed with the patient and are negative.  MEDICAL HISTORY:  Past Medical History:  Diagnosis Date   Abdominal discomfort    Chronic N/V/D. Presumptive dx Crohn's dx per elevated p ANCA. Failed Entocort and Pentasa. Sep 2003 - ileocolectomy c anastomosis per Dr Deon Pilling 2/2 adhesions -  path was hegative for Crohns. EGD, Sm bowel follow through (11/03), and an eteroclysis (10/03) were unrevealing.  Cuases hypomag and hypocalcemia.   Adnexal mass 8/03   s/p lap BSO (R ovarian fibroma) & lysis of adhesions   Allergy    Seasonal   Anemia    Multifactorial. Baseline HgB 10-11 ish. B12 def - 150 in 3/10. Fe Def - ferritin 35 3/10. Both are being repleted.   Breast cancer ([place]) 03/16/13   right, 5 o'clock   CAD (coronary artery disease) 1996   1996 - PTCA and angioplasty diagonal branch. 2000 - Rotoblator & angiopllasty of diagonal. 2006 - subendocardial AMI, DES to proximal LAD.Marland Kitchen Also had 90% stenosis in distal apical LAD. EF 55 with apical hypokinesis. Indefinite ASA and Plavix.   CHF (congestive heart failure) (HCC)    Chronic kidney disease    Chronic renal insuff baseline Cr 1.2 - 1.4 ish.   Chronic pain    CT 10/10 = Spinal stenosis L2 - S1.   Diabetes mellitus    Insulin dependent   Gout    Hx of radiation therapy 06/02/13- 07/16/13   right rbeast 4500 cGy 25 sessions, right breast boost 1600 cGy in 8 sessions   Hyperlipidemia    Managed with both a statin and Welchol. Welchol stopped 2014 2/2 cost and started on fenofibrate    Hypertension    2006 B renal arteries patent. 2003 MRA - no RAS. 2003 pheo W/U Dr Hassell Done reportedly negative.   Hypoxia 07/23/2017   Lupus ([place])    Lymphedema of breast    Personal history of radiation therapy 2015   RBBB    Renal vein thrombosis (HCC)    SBO (small bowel obstruction) ([place]) 09/17/2017   Secondary hyperparathyroidism ([place])    Stroke ([place])    Incidental finding MRI 2002 L lacunar infarct   Vitamin B12 deficiency    Vitamin D deficiency    Wears dentures    top    SURGICAL HISTORY: Past Surgical History:  Procedure Laterality Date   ABDOMINAL HYSTERECTOMY     BILATERAL SALPINGOOPHORECTOMY  8/03   Lap BSO (R ovarian fibroma) and adhesion lysis   BOWEL RESECTION  2003   ileocolectomy with anastomosis 2/2 adhesions   BREAST BIOPSY Right 2015   BREAST BIOPSY Right 07/2014   BREAST LUMPECTOMY Right  04/22/2013   BREAST LUMPECTOMY WITH NEEDLE LOCALIZATION AND AXILLARY SENTINEL LYMPH NODE BX Right 04/22/2013   Procedure: BREAST LUMPECTOMY WITH NEEDLE LOCALIZATION AND AXILLARY SENTINEL LYMPH NODE BX;  Surgeon: Stark Klein, MD;  Location: [facility];  Service: General;  Laterality: Right;   CARDIAC CATHETERIZATION     2 stents   CHOLECYSTECTOMY     COLONOSCOPY     HEMICOLECTOMY     R sided hemicolectomy   HERNIA REPAIR     Ventral hernia repair   PTCA  4/06    I have reviewed the social history and family history with the patient and they are unchanged from previous note.  ALLERGIES:  is allergic to percocet [oxycodone-acetaminophen]; diazepam; haloperidol lactate; lorazepam; morphine sulfate; propoxyphene hcl; tramadol hcl; anesthetics, halogenated; etomidate; fentanyl; and versed [midazolam].  MEDICATIONS:  Current Outpatient Medications  Medication Sig Dispense Refill   acetaminophen (TYLENOL) 325 MG tablet Take 650 mg by mouth every 6 (six) hours as needed for mild pain.     amLODipine (NORVASC) 10 MG tablet Take 1 tablet (10 mg total) by mouth daily. 30 tablet 0   atorvastatin (LIPITOR) 40  MG tablet Take 40 mg by mouth daily.     budesonide (ENTOCORT EC) 3 MG 24 hr capsule Take 1 capsule by mouth once daily 30 capsule 0   carvedilol (COREG) 25 MG tablet Take 25 mg by mouth 2 (two) times daily.     colestipol (COLESTID) 1 g tablet Take 2 g by mouth at bedtime.     desloratadine (CLARINEX) 5 MG tablet Take 5 mg by mouth at bedtime as needed (allergies). Reported on 04/11/2015     ferrous fumarate (HEMOCYTE - 106 MG FE) 325 (106 Fe) MG TABS tablet Take 1 tablet by mouth as directed.     gabapentin (NEURONTIN) 300 MG capsule Take 1 capsule by mouth at bedtime 30 capsule 0   hydrALAZINE (APRESOLINE) 100 MG tablet TAKE 1 TABLET BY MOUTH 3  TIMES DAILY 270 tablet 2   hydrOXYzine (ATARAX/VISTARIL) 50 MG tablet Take 50 mg by mouth 2 (two) times daily as  needed.     isosorbide mononitrate (IMDUR) 30 MG 24 hr tablet Take 1/2 (one-half) tablet by mouth once daily 45 tablet 6   LANTUS SOLOSTAR 100 UNIT/ML Solostar Pen SMARTSIG:14 Unit(s) SUB-Q Every Night     linagliptin (TRADJENTA) 5 MG TABS tablet Take 5 mg by mouth daily.     Melatonin 5 MG TABS Take 10 mg by mouth at bedtime.     nitroGLYCERIN (NITROSTAT) 0.4 MG SL tablet DISSOLVE 1 TABLET UNDER THE TONGUE EVERY 5 MINUTES AS  NEEDED FOR CHEST PAIN. MAX  OF 3 TABLETS IN 15 MINUTES. CALL 911 IF PAIN PERSISTS. 100 tablet 3   pantoprazole (PROTONIX) 40 MG tablet Take 1 tablet (40 mg total) by mouth daily before breakfast. 90 tablet 3   torsemide (DEMADEX) 20 MG tablet Take 20 mg by mouth daily. 20 mg every other day, 40 mg days not taking 20 mg     ULORIC 40 MG tablet Take 1 tablet by mouth daily.     Vitamin D, Ergocalciferol, (DRISDOL) 1.25 MG (50000 UNIT) CAPS capsule Take 1 capsule (50,000 Units total) by mouth every 7 (seven) days. 8 capsule 0   No current facility-administered medications for this visit.    PHYSICAL EXAMINATION: ECOG PERFORMANCE STATUS: 1 - Symptomatic but completely ambulatory  Vitals:   02/01/20 0856  BP: (!) 137/59  Pulse: 64  Resp: 16  Temp: (!) 97 F (36.1 C)  SpO2: 100%   Filed Weights   02/01/20 0856  Weight: 179 lb 8 oz (81.4 kg)    GENERAL:alert, no distress and comfortable SKIN: skin color, texture, turgor are normal, no rashes or significant lesions EYES: normal, Conjunctiva are pink and non-injected, sclera clear  NECK: supple, thyroid normal size, non-tender, without nodularity LYMPH:  no palpable lymphadenopathy in the cervical, axillary  LUNGS: clear to auscultation and percussion with normal breathing effort HEART: regular rate & rhythm and no murmurs and no lower extremity edema ABDOMEN:abdomen soft, non-tender and normal bowel sounds Musculoskeletal:no cyanosis of digits and no clubbing  NEURO: alert & oriented x 3 with fluent  speech, no focal motor/sensory deficits BREAST: S/p right lumpectomy: Surgical incision healed well with mild swelling of lower breast. Left breast exam benign.   LABORATORY DATA:  I have reviewed the data as listed CBC Latest Ref Rng & Units 02/01/2020 01/18/2020 01/04/2020  WBC 4.0 - 10.5 K/uL 7.0 6.6 8.7  Hemoglobin 12.0 - 15.0 g/dL 11.0(L) 10.7(L) 9.6(L)  Hematocrit 36 - 46 % 34.4(L) 34.3(L) 30.4(L)  Platelets 150 - 400 K/uL 209  183 210     CMP Latest Ref Rng & Units 12/21/2019 07/21/2019 06/29/2019  Glucose 70 - 99 mg/dL 164(H) 152(H) 122(H)  BUN 8 - 23 mg/dL 39(H) 33(H) 68(H)  Creatinine 0.44 - 1.00 mg/dL 2.51(H) 1.62(H) 2.55(H)  Sodium 135 - 145 mmol/L 141 144 143  Potassium 3.5 - 5.1 mmol/L 3.8 3.8 4.1  Chloride 98 - 111 mmol/L 110 114(H) 104  CO2 22 - 32 mmol/L 20(L) 21(L) 31  Calcium 8.9 - 10.3 mg/dL 9.3 8.4(L) 8.6(L)  Total Protein 6.5 - 8.1 g/dL 7.4 6.3(L) 7.0  Total Bilirubin 0.3 - 1.2 mg/dL 0.3 0.3 0.3  Alkaline Phos 38 - 126 U/L 74 92 81  AST 15 - 41 U/L _0 ALT 0 - 44 U/L _1 RADIOGRAPHIC STUDIES: I have personally reviewed the radiological images as listed and agreed with the findings in the report. No results found.   ASSESSMENT & PLAN:  [name] is a 74 y.o. female with    1. Anemia of chronic disease (CKDstage IV), Ironand B12deficiencyand GI bleeding -Her 04/2018 Colonoscopy with Dr Carlean Purl showed no evidence of GI bleedingor source of bleeding.Heranemia is probably related to her CKD, however her anemia is out of proportion of her stage IV CKD related anemia. -Her6/23/20 bone marrow biopsyresults showed hypercellular marrow, no evidence of MDS or other concerns.Her Cytogenetics is still pending. -She has been receiving IV iron as needed, for ferritin <100.  -Started receiving monthly B12 injections in our clinic on 05/04/19.Was increased to q2weeks on 06/29/19. -Herinsurancedid not approve Aranesp so shewas switched  toRetacrit every 2 weekson 11/14/18. Goal is Hg9-11.Due to worsened anemia, her dose has been increased, currently at40,000 every 2 weeks. -She continues to have blood transfusion as needed, last given 08/20/19. -Continue OTC iron pill ferrous sulfate 1-2 tab dailyfor irondeficiency -She is clinically doing well and notes feeling better lately. Labs reviewed, Hg 11. Vit B12 and Ferritin still pending. No Retacrit today. Will proceed with B12 injection today -Given recent good response, will reduce Retacrit to every 4 weeks with lab (goal Hg 9-11). Will giveIV iron if ferritin less than 100.  -continue B12 injection every 4 weeks  -F/u in 3 months.    2.Breast cancer of the lower inner quadrant of the right breast, invasive ductal carcinoma with DCIS, pT1bN0Mo, stage IA, ER+ and PR +, HER 2 negative -She was diagnosed in 02/2013 and treated with right lumpectomy, adjuvant radiation andAnastrozolefor 4 years until5/2019.Anastrozole was stopped after she developed septic shock from pneumonia and a cardiac arrest,and we decided not restarted due to the risk of cardiac toxicity. -She continues to do clinically well. She has no concerns. Her breast exam today was unremarkable with mild swelling of right breast from surgery.  -She isover6years out since diagnosis. Continue breast cancer surveillance, Next mammogram in 03/2020  3. DM, HTN, CAD -Continue medications managedby PCPand Dr. Johnsie Cancel the cardiologist.  4.Chronic renal insufficieny, CKD stageIV -She follows up with Dr.Uptonfor herkidney function.   5. Low Backpain, from degenerative arthritis and spinal stenosis -Tolerable and stable. Continue f/u with Orthopedics  6. Genetics results negative  7. Bone Health  -DEXA from 04/2018 shows slightly improved normal bone density with lowest T-score of 0.9 at forearm radius. Plan to repeat in 03/2020.  -Her VitD level is low. She will continue 1000u  daily.  8. Left Renal Vein Thrombosisin 07/2017 -Likely provoked from acute illnessand prior SBO -She stopped Xarelto in 01/2018 -  Her hypercoagulopathy work-up in November 2019 was negative  9. History of pneumoniaand cardiac arrestin 07/2017   PLAN: -No Retacrit today. Will proceed with B12 injection today -Lab and Retacrit and B12 every 4 weeks X3, will give IV Iron if Ferritin less than 100. -F/u in 3 months -Mammogram and Bone Density scan in 04/2020.     No problem-specific Assessment & Plan notes found for this encounter.   Orders Placed This Encounter  Procedures   MM Digital Screening    Standing Status:   Future    Standing Expiration Date:   01/31/2021    Order Specific Question:   Reason for Exam (SYMPTOM  OR DIAGNOSIS REQUIRED)    Answer:   screening    Order Specific Question:   Preferred imaging location?    Answer:   [facility]   DG Bone Density    Standing Status:   Future    Standing Expiration Date:   01/31/2021    Order Specific Question:   Reason for Exam (SYMPTOM  OR DIAGNOSIS REQUIRED)    Answer:   screening    Order Specific Question:   Preferred imaging location?    Answer:   [facility]   All questions were answered. The patient knows to call the clinic with any problems, questions or concerns. No barriers to learning was detected. The total time spent in the appointment was 30 minutes.     Truitt Merle, MD 02/01/2020   I, Joslyn Devon, am acting as scribe for Truitt Merle, MD.   I have reviewed the above documentation for accuracy and completeness, and I agree with the above.

## 2020-02-01 ENCOUNTER — Inpatient Hospital Stay: Payer: Medicare Other

## 2020-02-01 ENCOUNTER — Inpatient Hospital Stay (HOSPITAL_BASED_OUTPATIENT_CLINIC_OR_DEPARTMENT_OTHER): Payer: Medicare Other | Admitting: Hematology

## 2020-02-01 ENCOUNTER — Encounter: Payer: Self-pay | Admitting: Hematology

## 2020-02-01 ENCOUNTER — Other Ambulatory Visit: Payer: Self-pay

## 2020-02-01 VITALS — BP 137/59 | HR 64 | Temp 97.0°F | Resp 16 | Ht 65.0 in | Wt 179.5 lb

## 2020-02-01 DIAGNOSIS — E2839 Other primary ovarian failure: Secondary | ICD-10-CM

## 2020-02-01 DIAGNOSIS — N184 Chronic kidney disease, stage 4 (severe): Secondary | ICD-10-CM

## 2020-02-01 DIAGNOSIS — C50311 Malignant neoplasm of lower-inner quadrant of right female breast: Secondary | ICD-10-CM

## 2020-02-01 DIAGNOSIS — D631 Anemia in chronic kidney disease: Secondary | ICD-10-CM

## 2020-02-01 DIAGNOSIS — Z170 Estrogen receptor positive status [ER+]: Secondary | ICD-10-CM

## 2020-02-01 DIAGNOSIS — D509 Iron deficiency anemia, unspecified: Secondary | ICD-10-CM

## 2020-02-01 DIAGNOSIS — Z1231 Encounter for screening mammogram for malignant neoplasm of breast: Secondary | ICD-10-CM

## 2020-02-01 LAB — CBC WITH DIFFERENTIAL (CANCER CENTER ONLY)
Abs Immature Granulocytes: 0.03 10*3/uL (ref 0.00–0.07)
Basophils Absolute: 0 10*3/uL (ref 0.0–0.1)
Basophils Relative: 1 %
Eosinophils Absolute: 0.1 10*3/uL (ref 0.0–0.5)
Eosinophils Relative: 1 %
HCT: 34.4 % — ABNORMAL LOW (ref 36.0–46.0)
Hemoglobin: 11 g/dL — ABNORMAL LOW (ref 12.0–15.0)
Immature Granulocytes: 0 %
Lymphocytes Relative: 20 %
Lymphs Abs: 1.4 10*3/uL (ref 0.7–4.0)
MCH: 28.1 pg (ref 26.0–34.0)
MCHC: 32 g/dL (ref 30.0–36.0)
MCV: 87.8 fL (ref 80.0–100.0)
Monocytes Absolute: 0.6 10*3/uL (ref 0.1–1.0)
Monocytes Relative: 8 %
Neutro Abs: 4.9 10*3/uL (ref 1.7–7.7)
Neutrophils Relative %: 70 %
Platelet Count: 209 10*3/uL (ref 150–400)
RBC: 3.92 MIL/uL (ref 3.87–5.11)
RDW: 16.1 % — ABNORMAL HIGH (ref 11.5–15.5)
WBC Count: 7 10*3/uL (ref 4.0–10.5)
nRBC: 0 % (ref 0.0–0.2)

## 2020-02-01 LAB — VITAMIN B12: Vitamin B-12: 1349 pg/mL — ABNORMAL HIGH (ref 180–914)

## 2020-02-01 LAB — FERRITIN: Ferritin: 515 ng/mL — ABNORMAL HIGH (ref 11–307)

## 2020-02-01 MED ORDER — CYANOCOBALAMIN 1000 MCG/ML IJ SOLN
INTRAMUSCULAR | Status: AC
  Filled 2020-02-01: qty 1

## 2020-02-01 MED ORDER — CYANOCOBALAMIN 1000 MCG/ML IJ SOLN
1000.0000 ug | Freq: Once | INTRAMUSCULAR | Status: AC
  Administered 2020-02-01: 1000 ug via INTRAMUSCULAR

## 2020-02-02 ENCOUNTER — Telehealth: Payer: Self-pay | Admitting: Hematology

## 2020-02-02 NOTE — Telephone Encounter (Signed)
Scheduled per 11/15 los. Pt is aware of appt times and dates. Mailing pt appt calendar per pt request.

## 2020-02-29 ENCOUNTER — Other Ambulatory Visit: Payer: Self-pay

## 2020-02-29 ENCOUNTER — Inpatient Hospital Stay: Payer: Medicare Other | Attending: Internal Medicine

## 2020-02-29 ENCOUNTER — Inpatient Hospital Stay: Payer: Medicare Other

## 2020-02-29 VITALS — BP 113/69 | HR 62 | Temp 97.7°F | Resp 18

## 2020-02-29 DIAGNOSIS — D509 Iron deficiency anemia, unspecified: Secondary | ICD-10-CM

## 2020-02-29 DIAGNOSIS — E538 Deficiency of other specified B group vitamins: Secondary | ICD-10-CM | POA: Insufficient documentation

## 2020-02-29 DIAGNOSIS — N184 Chronic kidney disease, stage 4 (severe): Secondary | ICD-10-CM | POA: Diagnosis not present

## 2020-02-29 DIAGNOSIS — D631 Anemia in chronic kidney disease: Secondary | ICD-10-CM | POA: Insufficient documentation

## 2020-02-29 LAB — CBC WITH DIFFERENTIAL (CANCER CENTER ONLY)
Abs Immature Granulocytes: 0.05 10*3/uL (ref 0.00–0.07)
Basophils Absolute: 0 10*3/uL (ref 0.0–0.1)
Basophils Relative: 0 %
Eosinophils Absolute: 0.1 10*3/uL (ref 0.0–0.5)
Eosinophils Relative: 1 %
HCT: 30.9 % — ABNORMAL LOW (ref 36.0–46.0)
Hemoglobin: 10 g/dL — ABNORMAL LOW (ref 12.0–15.0)
Immature Granulocytes: 1 %
Lymphocytes Relative: 18 %
Lymphs Abs: 1.8 10*3/uL (ref 0.7–4.0)
MCH: 27.7 pg (ref 26.0–34.0)
MCHC: 32.4 g/dL (ref 30.0–36.0)
MCV: 85.6 fL (ref 80.0–100.0)
Monocytes Absolute: 0.8 10*3/uL (ref 0.1–1.0)
Monocytes Relative: 8 %
Neutro Abs: 7.3 10*3/uL (ref 1.7–7.7)
Neutrophils Relative %: 72 %
Platelet Count: 201 10*3/uL (ref 150–400)
RBC: 3.61 MIL/uL — ABNORMAL LOW (ref 3.87–5.11)
RDW: 14.5 % (ref 11.5–15.5)
WBC Count: 10 10*3/uL (ref 4.0–10.5)
nRBC: 0 % (ref 0.0–0.2)

## 2020-02-29 LAB — FERRITIN: Ferritin: 537 ng/mL — ABNORMAL HIGH (ref 11–307)

## 2020-02-29 MED ORDER — EPOETIN ALFA-EPBX 40000 UNIT/ML IJ SOLN
40000.0000 [IU] | Freq: Once | INTRAMUSCULAR | Status: AC
  Administered 2020-02-29: 40000 [IU] via SUBCUTANEOUS

## 2020-02-29 MED ORDER — CYANOCOBALAMIN 1000 MCG/ML IJ SOLN
INTRAMUSCULAR | Status: AC
  Filled 2020-02-29: qty 1

## 2020-02-29 MED ORDER — CYANOCOBALAMIN 1000 MCG/ML IJ SOLN
1000.0000 ug | Freq: Once | INTRAMUSCULAR | Status: AC
  Administered 2020-02-29: 1000 ug via INTRAMUSCULAR

## 2020-02-29 MED ORDER — EPOETIN ALFA-EPBX 40000 UNIT/ML IJ SOLN
INTRAMUSCULAR | Status: AC
  Filled 2020-02-29: qty 1

## 2020-02-29 NOTE — Patient Instructions (Signed)
Darbepoetin Alfa injection What is this medicine? DARBEPOETIN ALFA (dar be POE e tin AL fa) helps your body make more red blood cells. It is used to treat anemia caused by chronic kidney failure and chemotherapy. This medicine may be used for other purposes; ask your health care provider or pharmacist if you have questions. COMMON BRAND NAME(S): Aranesp What should I tell my health care provider before I take this medicine? They need to know if you have any of these conditions:  blood clotting disorders or history of blood clots  cancer patient not on chemotherapy  cystic fibrosis  heart disease, such as angina, heart failure, or a history of a heart attack  hemoglobin level of 12 g/dL or greater  high blood pressure  low levels of folate, iron, or vitamin B12  seizures  an unusual or allergic reaction to darbepoetin, erythropoietin, albumin, hamster proteins, latex, other medicines, foods, dyes, or preservatives  pregnant or trying to get pregnant  breast-feeding How should I use this medicine? This medicine is for injection into a vein or under the skin. It is usually given by a health care professional in a hospital or clinic setting. If you get this medicine at home, you will be taught how to prepare and give this medicine. Use exactly as directed. Take your medicine at regular intervals. Do not take your medicine more often than directed. It is important that you put your used needles and syringes in a special sharps container. Do not put them in a trash can. If you do not have a sharps container, call your pharmacist or healthcare provider to get one. A special MedGuide will be given to you by the pharmacist with each prescription and refill. Be sure to read this information carefully each time. Talk to your pediatrician regarding the use of this medicine in children. While this medicine may be used in children as young as 1 month of age for selected conditions, precautions do  apply. Overdosage: If you think you have taken too much of this medicine contact a poison control center or emergency room at once. NOTE: This medicine is only for you. Do not share this medicine with others. What if I miss a dose? If you miss a dose, take it as soon as you can. If it is almost time for your next dose, take only that dose. Do not take double or extra doses. What may interact with this medicine? Do not take this medicine with any of the following medications:  epoetin alfa This list may not describe all possible interactions. Give your health care provider a list of all the medicines, herbs, non-prescription drugs, or dietary supplements you use. Also tell them if you smoke, drink alcohol, or use illegal drugs. Some items may interact with your medicine. What should I watch for while using this medicine? Your condition will be monitored carefully while you are receiving this medicine. You may need blood work done while you are taking this medicine. This medicine may cause a decrease in vitamin B6. You should make sure that you get enough vitamin B6 while you are taking this medicine. Discuss the foods you eat and the vitamins you take with your health care professional. What side effects may I notice from receiving this medicine? Side effects that you should report to your doctor or health care professional as soon as possible:  allergic reactions like skin rash, itching or hives, swelling of the face, lips, or tongue  breathing problems  changes in   vision  chest pain  confusion, trouble speaking or understanding  feeling faint or lightheaded, falls  high blood pressure  muscle aches or pains  pain, swelling, warmth in the leg  rapid weight gain  severe headaches  sudden numbness or weakness of the face, arm or leg  trouble walking, dizziness, loss of balance or coordination  seizures (convulsions)  swelling of the ankles, feet, hands  unusually weak or  tired Side effects that usually do not require medical attention (report to your doctor or health care professional if they continue or are bothersome):  diarrhea  fever, chills (flu-like symptoms)  headaches  nausea, vomiting  redness, stinging, or swelling at site where injected This list may not describe all possible side effects. Call your doctor for medical advice about side effects. You may report side effects to FDA at 1-800-FDA-1088. Where should I keep my medicine? Keep out of the reach of children. Store in a refrigerator between 2 and 8 degrees C (36 and 46 degrees F). Do not freeze. Do not shake. Throw away any unused portion if using a single-dose vial. Throw away any unused medicine after the expiration date. NOTE: This sheet is a summary. It may not cover all possible information. If you have questions about this medicine, talk to your doctor, pharmacist, or health care provider.  2020 Elsevier/Gold Standard (2017-03-20 16:44:20) Cyanocobalamin, Vitamin B12 injection What is this medicine? CYANOCOBALAMIN (sye an oh koe BAL a min) is a man made form of vitamin B12. Vitamin B12 is used in the growth of healthy blood cells, nerve cells, and proteins in the body. It also helps with the metabolism of fats and carbohydrates. This medicine is used to treat people who can not absorb vitamin B12. This medicine may be used for other purposes; ask your health care provider or pharmacist if you have questions. COMMON BRAND NAME(S): B-12 Compliance Kit, B-12 Injection Kit, Cyomin, LA-12, Nutri-Twelve, Physicians EZ Use B-12, Primabalt What should I tell my health care provider before I take this medicine? They need to know if you have any of these conditions:  kidney disease  Leber's disease  megaloblastic anemia  an unusual or allergic reaction to cyanocobalamin, cobalt, other medicines, foods, dyes, or preservatives  pregnant or trying to get pregnant  breast-feeding How  should I use this medicine? This medicine is injected into a muscle or deeply under the skin. It is usually given by a health care professional in a clinic or doctor's office. However, your doctor may teach you how to inject yourself. Follow all instructions. Talk to your pediatrician regarding the use of this medicine in children. Special care may be needed. Overdosage: If you think you have taken too much of this medicine contact a poison control center or emergency room at once. NOTE: This medicine is only for you. Do not share this medicine with others. What if I miss a dose? If you are given your dose at a clinic or doctor's office, call to reschedule your appointment. If you give your own injections and you miss a dose, take it as soon as you can. If it is almost time for your next dose, take only that dose. Do not take double or extra doses. What may interact with this medicine?  colchicine  heavy alcohol intake This list may not describe all possible interactions. Give your health care provider a list of all the medicines, herbs, non-prescription drugs, or dietary supplements you use. Also tell them if you smoke, drink alcohol,   smoke, drink alcohol, or use illegal drugs. Some items may interact with your medicine. What should I watch for while using this medicine? Visit your doctor or health care professional regularly. You may need blood work done while you are taking this medicine. You may need to follow a special diet. Talk to your doctor. Limit your alcohol intake and avoid smoking to get the best benefit. What side effects may I notice from receiving this medicine? Side effects that you should report to your doctor or health care professional as soon as possible:  allergic reactions like skin rash, itching or hives, swelling of the face, lips, or tongue  blue tint to skin  chest tightness, pain  difficulty breathing, wheezing  dizziness  red, swollen painful area  on the leg Side effects that usually do not require medical attention (report to your doctor or health care professional if they continue or are bothersome):  diarrhea  headache This list may not describe all possible side effects. Call your doctor for medical advice about side effects. You may report side effects to FDA at 1-800-FDA-1088. Where should I keep my medicine? Keep out of the reach of children. Store at room temperature between 15 and 30 degrees C (59 and 85 degrees F). Protect from light. Throw away any unused medicine after the expiration date. NOTE: This sheet is a summary. It may not cover all possible information. If you have questions about this medicine, talk to your doctor, pharmacist, or health care provider.  2020 Elsevier/Gold Standard (2007-06-16 22:10:20)

## 2020-03-10 ENCOUNTER — Other Ambulatory Visit: Payer: Self-pay | Admitting: Internal Medicine

## 2020-03-26 ENCOUNTER — Other Ambulatory Visit: Payer: Self-pay

## 2020-03-26 ENCOUNTER — Other Ambulatory Visit: Payer: Medicare Other

## 2020-03-26 DIAGNOSIS — Z20822 Contact with and (suspected) exposure to covid-19: Secondary | ICD-10-CM | POA: Diagnosis not present

## 2020-03-28 ENCOUNTER — Inpatient Hospital Stay: Payer: Medicare Other | Attending: Internal Medicine

## 2020-03-28 ENCOUNTER — Other Ambulatory Visit: Payer: Self-pay

## 2020-03-28 ENCOUNTER — Inpatient Hospital Stay: Payer: Medicare Other

## 2020-03-28 VITALS — BP 132/55 | HR 69 | Resp 18

## 2020-03-28 DIAGNOSIS — N184 Chronic kidney disease, stage 4 (severe): Secondary | ICD-10-CM | POA: Insufficient documentation

## 2020-03-28 DIAGNOSIS — E538 Deficiency of other specified B group vitamins: Secondary | ICD-10-CM | POA: Insufficient documentation

## 2020-03-28 DIAGNOSIS — D509 Iron deficiency anemia, unspecified: Secondary | ICD-10-CM

## 2020-03-28 DIAGNOSIS — D631 Anemia in chronic kidney disease: Secondary | ICD-10-CM | POA: Diagnosis not present

## 2020-03-28 LAB — CBC WITH DIFFERENTIAL (CANCER CENTER ONLY)
Abs Immature Granulocytes: 0.03 10*3/uL (ref 0.00–0.07)
Basophils Absolute: 0 10*3/uL (ref 0.0–0.1)
Basophils Relative: 0 %
Eosinophils Absolute: 0 10*3/uL (ref 0.0–0.5)
Eosinophils Relative: 0 %
HCT: 32.8 % — ABNORMAL LOW (ref 36.0–46.0)
Hemoglobin: 10.4 g/dL — ABNORMAL LOW (ref 12.0–15.0)
Immature Granulocytes: 1 %
Lymphocytes Relative: 32 %
Lymphs Abs: 1.8 10*3/uL (ref 0.7–4.0)
MCH: 27.5 pg (ref 26.0–34.0)
MCHC: 31.7 g/dL (ref 30.0–36.0)
MCV: 86.8 fL (ref 80.0–100.0)
Monocytes Absolute: 0.6 10*3/uL (ref 0.1–1.0)
Monocytes Relative: 11 %
Neutro Abs: 3.2 10*3/uL (ref 1.7–7.7)
Neutrophils Relative %: 56 %
Platelet Count: 212 10*3/uL (ref 150–400)
RBC: 3.78 MIL/uL — ABNORMAL LOW (ref 3.87–5.11)
RDW: 14.1 % (ref 11.5–15.5)
WBC Count: 5.6 10*3/uL (ref 4.0–10.5)
nRBC: 0 % (ref 0.0–0.2)

## 2020-03-28 LAB — FERRITIN: Ferritin: 812 ng/mL — ABNORMAL HIGH (ref 11–307)

## 2020-03-28 MED ORDER — EPOETIN ALFA-EPBX 40000 UNIT/ML IJ SOLN
40000.0000 [IU] | Freq: Once | INTRAMUSCULAR | Status: AC
  Administered 2020-03-28: 40000 [IU] via SUBCUTANEOUS

## 2020-03-28 MED ORDER — EPOETIN ALFA-EPBX 40000 UNIT/ML IJ SOLN
INTRAMUSCULAR | Status: AC
  Filled 2020-03-28: qty 1

## 2020-03-28 MED ORDER — CYANOCOBALAMIN 1000 MCG/ML IJ SOLN
INTRAMUSCULAR | Status: AC
  Filled 2020-03-28: qty 1

## 2020-03-28 MED ORDER — CYANOCOBALAMIN 1000 MCG/ML IJ SOLN
1000.0000 ug | Freq: Once | INTRAMUSCULAR | Status: AC
  Administered 2020-03-28: 1000 ug via INTRAMUSCULAR

## 2020-03-28 NOTE — Patient Instructions (Signed)
Epoetin Alfa injection What is this medicine? EPOETIN ALFA (e POE e tin AL fa) helps your body make more red blood cells. This medicine is used to treat anemia caused by chronic kidney disease, cancer chemotherapy, or HIV-therapy. It may also be used before surgery if you have anemia. This medicine may be used for other purposes; ask your health care provider or pharmacist if you have questions. COMMON BRAND NAME(S): Epogen, Procrit, Retacrit What should I tell my health care provider before I take this medicine? They need to know if you have any of these conditions:  cancer  heart disease  high blood pressure  history of blood clots  history of stroke  low levels of folate, iron, or vitamin B12 in the blood  seizures  an unusual or allergic reaction to erythropoietin, albumin, benzyl alcohol, hamster proteins, other medicines, foods, dyes, or preservatives  pregnant or trying to get pregnant  breast-feeding How should I use this medicine? This medicine is for injection into a vein or under the skin. It is usually given by a health care professional in a hospital or clinic setting. If you get this medicine at home, you will be taught how to prepare and give this medicine. Use exactly as directed. Take your medicine at regular intervals. Do not take your medicine more often than directed. It is important that you put your used needles and syringes in a special sharps container. Do not put them in a trash can. If you do not have a sharps container, call your pharmacist or healthcare provider to get one. A special MedGuide will be given to you by the pharmacist with each prescription and refill. Be sure to read this information carefully each time. Talk to your pediatrician regarding the use of this medicine in children. While this drug may be prescribed for selected conditions, precautions do apply. Overdosage: If you think you have taken too much of this medicine contact a poison  control center or emergency room at once. NOTE: This medicine is only for you. Do not share this medicine with others. What if I miss a dose? If you miss a dose, take it as soon as you can. If it is almost time for your next dose, take only that dose. Do not take double or extra doses. What may interact with this medicine? Interactions have not been studied. This list may not describe all possible interactions. Give your health care provider a list of all the medicines, herbs, non-prescription drugs, or dietary supplements you use. Also tell them if you smoke, drink alcohol, or use illegal drugs. Some items may interact with your medicine. What should I watch for while using this medicine? Your condition will be monitored carefully while you are receiving this medicine. You may need blood work done while you are taking this medicine. This medicine may cause a decrease in vitamin B6. You should make sure that you get enough vitamin B6 while you are taking this medicine. Discuss the foods you eat and the vitamins you take with your health care professional. What side effects may I notice from receiving this medicine? Side effects that you should report to your doctor or health care professional as soon as possible:  allergic reactions like skin rash, itching or hives, swelling of the face, lips, or tongue  seizures  signs and symptoms of a blood clot such as breathing problems; changes in vision; chest pain; severe, sudden headache; pain, swelling, warmth in the leg; trouble speaking; sudden numbness or  weakness of the face, arm or leg  signs and symptoms of a stroke like changes in vision; confusion; trouble speaking or understanding; severe headaches; sudden numbness or weakness of the face, arm or leg; trouble walking; dizziness; loss of balance or coordination Side effects that usually do not require medical attention (report to your doctor or health care professional if they continue or are  bothersome):  chills  cough  dizziness  fever  headaches  joint pain  muscle cramps  muscle pain  nausea, vomiting  pain, redness, or irritation at site where injected This list may not describe all possible side effects. Call your doctor for medical advice about side effects. You may report side effects to FDA at 1-800-FDA-1088. Where should I keep my medicine? Keep out of the reach of children. Store in a refrigerator between 2 and 8 degrees C (36 and 46 degrees F). Do not freeze or shake. Throw away any unused portion if using a single-dose vial. Multi-dose vials can be kept in the refrigerator for up to 21 days after the initial dose. Throw away unused medicine. NOTE: This sheet is a summary. It may not cover all possible information. If you have questions about this medicine, talk to your doctor, pharmacist, or health care provider.  2021 Elsevier/Gold Standard (2016-10-12 08:35:19)  Cyanocobalamin, Vitamin B12 injection What is this medicine? CYANOCOBALAMIN (sye an oh koe BAL a min) is a man made form of vitamin B12. Vitamin B12 is used in the growth of healthy blood cells, nerve cells, and proteins in the body. It also helps with the metabolism of fats and carbohydrates. This medicine is used to treat people who can not absorb vitamin B12. This medicine may be used for other purposes; ask your health care provider or pharmacist if you have questions. COMMON BRAND NAME(S): B-12 Compliance Kit, B-12 Injection Kit, Cyomin, LA-12, Nutri-Twelve, Physicians EZ Use B-12, Primabalt What should I tell my health care provider before I take this medicine? They need to know if you have any of these conditions:  kidney disease  Leber's disease  megaloblastic anemia  an unusual or allergic reaction to cyanocobalamin, cobalt, other medicines, foods, dyes, or preservatives  pregnant or trying to get pregnant  breast-feeding How should I use this medicine? This medicine is  injected into a muscle or deeply under the skin. It is usually given by a health care professional in a clinic or doctor's office. However, your doctor may teach you how to inject yourself. Follow all instructions. Talk to your pediatrician regarding the use of this medicine in children. Special care may be needed. Overdosage: If you think you have taken too much of this medicine contact a poison control center or emergency room at once. NOTE: This medicine is only for you. Do not share this medicine with others. What if I miss a dose? If you are given your dose at a clinic or doctor's office, call to reschedule your appointment. If you give your own injections and you miss a dose, take it as soon as you can. If it is almost time for your next dose, take only that dose. Do not take double or extra doses. What may interact with this medicine?  colchicine  heavy alcohol intake This list may not describe all possible interactions. Give your health care provider a list of all the medicines, herbs, non-prescription drugs, or dietary supplements you use. Also tell them if you smoke, drink alcohol, or use illegal drugs. Some items may interact with   your medicine. What should I watch for while using this medicine? Visit your doctor or health care professional regularly. You may need blood work done while you are taking this medicine. You may need to follow a special diet. Talk to your doctor. Limit your alcohol intake and avoid smoking to get the best benefit. What side effects may I notice from receiving this medicine? Side effects that you should report to your doctor or health care professional as soon as possible:  allergic reactions like skin rash, itching or hives, swelling of the face, lips, or tongue  blue tint to skin  chest tightness, pain  difficulty breathing, wheezing  dizziness  red, swollen painful area on the leg Side effects that usually do not require medical attention (report  to your doctor or health care professional if they continue or are bothersome):  diarrhea  headache This list may not describe all possible side effects. Call your doctor for medical advice about side effects. You may report side effects to FDA at 1-800-FDA-1088. Where should I keep my medicine? Keep out of the reach of children. Store at room temperature between 15 and 30 degrees C (59 and 85 degrees F). Protect from light. Throw away any unused medicine after the expiration date. NOTE: This sheet is a summary. It may not cover all possible information. If you have questions about this medicine, talk to your doctor, pharmacist, or health care provider.  2021 Elsevier/Gold Standard (2007-06-16 22:10:20)   

## 2020-03-29 LAB — NOVEL CORONAVIRUS, NAA: SARS-CoV-2, NAA: DETECTED — AB

## 2020-03-30 ENCOUNTER — Telehealth: Payer: Self-pay

## 2020-03-30 NOTE — Telephone Encounter (Signed)
Contacted pt. In regard to additional treatments for COVID 19. Pt. States she never had symptoms, had an exposure from her grandchild.

## 2020-04-06 ENCOUNTER — Other Ambulatory Visit: Payer: Self-pay | Admitting: Internal Medicine

## 2020-04-15 DIAGNOSIS — E7800 Pure hypercholesterolemia, unspecified: Secondary | ICD-10-CM | POA: Diagnosis not present

## 2020-04-15 DIAGNOSIS — Z853 Personal history of malignant neoplasm of breast: Secondary | ICD-10-CM | POA: Diagnosis not present

## 2020-04-15 DIAGNOSIS — E1122 Type 2 diabetes mellitus with diabetic chronic kidney disease: Secondary | ICD-10-CM | POA: Diagnosis not present

## 2020-04-15 DIAGNOSIS — I10 Essential (primary) hypertension: Secondary | ICD-10-CM | POA: Diagnosis not present

## 2020-04-15 DIAGNOSIS — N184 Chronic kidney disease, stage 4 (severe): Secondary | ICD-10-CM | POA: Diagnosis not present

## 2020-04-15 DIAGNOSIS — D509 Iron deficiency anemia, unspecified: Secondary | ICD-10-CM | POA: Diagnosis not present

## 2020-04-15 DIAGNOSIS — I2510 Atherosclerotic heart disease of native coronary artery without angina pectoris: Secondary | ICD-10-CM | POA: Diagnosis not present

## 2020-04-15 DIAGNOSIS — E1169 Type 2 diabetes mellitus with other specified complication: Secondary | ICD-10-CM | POA: Diagnosis not present

## 2020-04-25 ENCOUNTER — Inpatient Hospital Stay: Payer: Medicare Other

## 2020-04-25 ENCOUNTER — Inpatient Hospital Stay: Payer: Medicare Other | Attending: Internal Medicine

## 2020-04-25 ENCOUNTER — Other Ambulatory Visit: Payer: Self-pay

## 2020-04-25 VITALS — BP 139/58 | HR 80 | Temp 98.3°F | Resp 18

## 2020-04-25 DIAGNOSIS — D509 Iron deficiency anemia, unspecified: Secondary | ICD-10-CM

## 2020-04-25 DIAGNOSIS — E538 Deficiency of other specified B group vitamins: Secondary | ICD-10-CM | POA: Insufficient documentation

## 2020-04-25 DIAGNOSIS — D631 Anemia in chronic kidney disease: Secondary | ICD-10-CM | POA: Insufficient documentation

## 2020-04-25 DIAGNOSIS — N184 Chronic kidney disease, stage 4 (severe): Secondary | ICD-10-CM | POA: Diagnosis not present

## 2020-04-25 LAB — CBC WITH DIFFERENTIAL (CANCER CENTER ONLY)
Abs Immature Granulocytes: 0.02 10*3/uL (ref 0.00–0.07)
Basophils Absolute: 0 10*3/uL (ref 0.0–0.1)
Basophils Relative: 0 %
Eosinophils Absolute: 0.1 10*3/uL (ref 0.0–0.5)
Eosinophils Relative: 1 %
HCT: 28.9 % — ABNORMAL LOW (ref 36.0–46.0)
Hemoglobin: 9.2 g/dL — ABNORMAL LOW (ref 12.0–15.0)
Immature Granulocytes: 0 %
Lymphocytes Relative: 20 %
Lymphs Abs: 1.4 10*3/uL (ref 0.7–4.0)
MCH: 28 pg (ref 26.0–34.0)
MCHC: 31.8 g/dL (ref 30.0–36.0)
MCV: 88.1 fL (ref 80.0–100.0)
Monocytes Absolute: 0.5 10*3/uL (ref 0.1–1.0)
Monocytes Relative: 6 %
Neutro Abs: 5.1 10*3/uL (ref 1.7–7.7)
Neutrophils Relative %: 73 %
Platelet Count: 166 10*3/uL (ref 150–400)
RBC: 3.28 MIL/uL — ABNORMAL LOW (ref 3.87–5.11)
RDW: 15.6 % — ABNORMAL HIGH (ref 11.5–15.5)
WBC Count: 7.1 10*3/uL (ref 4.0–10.5)
nRBC: 0 % (ref 0.0–0.2)

## 2020-04-25 LAB — FERRITIN: Ferritin: 470 ng/mL — ABNORMAL HIGH (ref 11–307)

## 2020-04-25 LAB — VITAMIN B12: Vitamin B-12: 766 pg/mL (ref 180–914)

## 2020-04-25 MED ORDER — EPOETIN ALFA-EPBX 40000 UNIT/ML IJ SOLN
INTRAMUSCULAR | Status: AC
  Filled 2020-04-25: qty 1

## 2020-04-25 MED ORDER — CYANOCOBALAMIN 1000 MCG/ML IJ SOLN
INTRAMUSCULAR | Status: AC
  Filled 2020-04-25: qty 1

## 2020-04-25 MED ORDER — CYANOCOBALAMIN 1000 MCG/ML IJ SOLN
1000.0000 ug | Freq: Once | INTRAMUSCULAR | Status: AC
  Administered 2020-04-25: 1000 ug via INTRAMUSCULAR

## 2020-04-25 MED ORDER — EPOETIN ALFA-EPBX 40000 UNIT/ML IJ SOLN
40000.0000 [IU] | Freq: Once | INTRAMUSCULAR | Status: AC
  Administered 2020-04-25: 40000 [IU] via SUBCUTANEOUS

## 2020-04-25 NOTE — Patient Instructions (Signed)
Epoetin Alfa injection What is this medicine? EPOETIN ALFA (e POE e tin AL fa) helps your body make more red blood cells. This medicine is used to treat anemia caused by chronic kidney disease, cancer chemotherapy, or HIV-therapy. It may also be used before surgery if you have anemia. This medicine may be used for other purposes; ask your health care provider or pharmacist if you have questions. COMMON BRAND NAME(S): Epogen, Procrit, Retacrit What should I tell my health care provider before I take this medicine? They need to know if you have any of these conditions:  cancer  heart disease  high blood pressure  history of blood clots  history of stroke  low levels of folate, iron, or vitamin B12 in the blood  seizures  an unusual or allergic reaction to erythropoietin, albumin, benzyl alcohol, hamster proteins, other medicines, foods, dyes, or preservatives  pregnant or trying to get pregnant  breast-feeding How should I use this medicine? This medicine is for injection into a vein or under the skin. It is usually given by a health care professional in a hospital or clinic setting. If you get this medicine at home, you will be taught how to prepare and give this medicine. Use exactly as directed. Take your medicine at regular intervals. Do not take your medicine more often than directed. It is important that you put your used needles and syringes in a special sharps container. Do not put them in a trash can. If you do not have a sharps container, call your pharmacist or healthcare provider to get one. A special MedGuide will be given to you by the pharmacist with each prescription and refill. Be sure to read this information carefully each time. Talk to your pediatrician regarding the use of this medicine in children. While this drug may be prescribed for selected conditions, precautions do apply. Overdosage: If you think you have taken too much of this medicine contact a poison  control center or emergency room at once. NOTE: This medicine is only for you. Do not share this medicine with others. What if I miss a dose? If you miss a dose, take it as soon as you can. If it is almost time for your next dose, take only that dose. Do not take double or extra doses. What may interact with this medicine? Interactions have not been studied. This list may not describe all possible interactions. Give your health care provider a list of all the medicines, herbs, non-prescription drugs, or dietary supplements you use. Also tell them if you smoke, drink alcohol, or use illegal drugs. Some items may interact with your medicine. What should I watch for while using this medicine? Your condition will be monitored carefully while you are receiving this medicine. You may need blood work done while you are taking this medicine. This medicine may cause a decrease in vitamin B6. You should make sure that you get enough vitamin B6 while you are taking this medicine. Discuss the foods you eat and the vitamins you take with your health care professional. What side effects may I notice from receiving this medicine? Side effects that you should report to your doctor or health care professional as soon as possible:  allergic reactions like skin rash, itching or hives, swelling of the face, lips, or tongue  seizures  signs and symptoms of a blood clot such as breathing problems; changes in vision; chest pain; severe, sudden headache; pain, swelling, warmth in the leg; trouble speaking; sudden numbness or  weakness of the face, arm or leg  signs and symptoms of a stroke like changes in vision; confusion; trouble speaking or understanding; severe headaches; sudden numbness or weakness of the face, arm or leg; trouble walking; dizziness; loss of balance or coordination Side effects that usually do not require medical attention (report to your doctor or health care professional if they continue or are  bothersome):  chills  cough  dizziness  fever  headaches  joint pain  muscle cramps  muscle pain  nausea, vomiting  pain, redness, or irritation at site where injected This list may not describe all possible side effects. Call your doctor for medical advice about side effects. You may report side effects to FDA at 1-800-FDA-1088. Where should I keep my medicine? Keep out of the reach of children. Store in a refrigerator between 2 and 8 degrees C (36 and 46 degrees F). Do not freeze or shake. Throw away any unused portion if using a single-dose vial. Multi-dose vials can be kept in the refrigerator for up to 21 days after the initial dose. Throw away unused medicine. NOTE: This sheet is a summary. It may not cover all possible information. If you have questions about this medicine, talk to your doctor, pharmacist, or health care provider.  2021 Elsevier/Gold Standard (2016-10-12 08:35:19)  Cyanocobalamin, Vitamin B12 injection What is this medicine? CYANOCOBALAMIN (sye an oh koe BAL a min) is a man made form of vitamin B12. Vitamin B12 is used in the growth of healthy blood cells, nerve cells, and proteins in the body. It also helps with the metabolism of fats and carbohydrates. This medicine is used to treat people who can not absorb vitamin B12. This medicine may be used for other purposes; ask your health care provider or pharmacist if you have questions. COMMON BRAND NAME(S): B-12 Compliance Kit, B-12 Injection Kit, Cyomin, LA-12, Nutri-Twelve, Physicians EZ Use B-12, Primabalt What should I tell my health care provider before I take this medicine? They need to know if you have any of these conditions:  kidney disease  Leber's disease  megaloblastic anemia  an unusual or allergic reaction to cyanocobalamin, cobalt, other medicines, foods, dyes, or preservatives  pregnant or trying to get pregnant  breast-feeding How should I use this medicine? This medicine is  injected into a muscle or deeply under the skin. It is usually given by a health care professional in a clinic or doctor's office. However, your doctor may teach you how to inject yourself. Follow all instructions. Talk to your pediatrician regarding the use of this medicine in children. Special care may be needed. Overdosage: If you think you have taken too much of this medicine contact a poison control center or emergency room at once. NOTE: This medicine is only for you. Do not share this medicine with others. What if I miss a dose? If you are given your dose at a clinic or doctor's office, call to reschedule your appointment. If you give your own injections and you miss a dose, take it as soon as you can. If it is almost time for your next dose, take only that dose. Do not take double or extra doses. What may interact with this medicine?  colchicine  heavy alcohol intake This list may not describe all possible interactions. Give your health care provider a list of all the medicines, herbs, non-prescription drugs, or dietary supplements you use. Also tell them if you smoke, drink alcohol, or use illegal drugs. Some items may interact with   your medicine. What should I watch for while using this medicine? Visit your doctor or health care professional regularly. You may need blood work done while you are taking this medicine. You may need to follow a special diet. Talk to your doctor. Limit your alcohol intake and avoid smoking to get the best benefit. What side effects may I notice from receiving this medicine? Side effects that you should report to your doctor or health care professional as soon as possible:  allergic reactions like skin rash, itching or hives, swelling of the face, lips, or tongue  blue tint to skin  chest tightness, pain  difficulty breathing, wheezing  dizziness  red, swollen painful area on the leg Side effects that usually do not require medical attention (report  to your doctor or health care professional if they continue or are bothersome):  diarrhea  headache This list may not describe all possible side effects. Call your doctor for medical advice about side effects. You may report side effects to FDA at 1-800-FDA-1088. Where should I keep my medicine? Keep out of the reach of children. Store at room temperature between 15 and 30 degrees C (59 and 85 degrees F). Protect from light. Throw away any unused medicine after the expiration date. NOTE: This sheet is a summary. It may not cover all possible information. If you have questions about this medicine, talk to your doctor, pharmacist, or health care provider.  2021 Elsevier/Gold Standard (2007-06-16 22:10:20)   

## 2020-04-28 ENCOUNTER — Telehealth: Payer: Self-pay

## 2020-04-28 NOTE — Telephone Encounter (Signed)
Spoke with pt made her aware of most recent labs new medication recommendations and updated appts pt states she understands encouraged to call for any questions concerns or changes

## 2020-04-28 NOTE — Telephone Encounter (Signed)
-----   Message from Alla Feeling, NP sent at 04/28/2020  8:14 AM EST ----- Please let her know hemoglobin dropped slightly, B12 and iron adequate. Check for bleeding. Continue monthly injections and oral iron. May need to increase retacrit based on next months labs on 3/7 with follow up. Please remind her of appointments.  Thanks, Regan Rakers, NP

## 2020-05-09 ENCOUNTER — Telehealth: Payer: Self-pay | Admitting: Nurse Practitioner

## 2020-05-09 NOTE — Telephone Encounter (Signed)
Called to inform patient that her upcoming appointment was virtual and moved per provider's template. Patient is aware and complained that her right breast was itching, hurting, and turning black. Sent update to nurse.

## 2020-05-20 DIAGNOSIS — E1122 Type 2 diabetes mellitus with diabetic chronic kidney disease: Secondary | ICD-10-CM | POA: Diagnosis not present

## 2020-05-20 DIAGNOSIS — M109 Gout, unspecified: Secondary | ICD-10-CM | POA: Diagnosis not present

## 2020-05-20 DIAGNOSIS — I129 Hypertensive chronic kidney disease with stage 1 through stage 4 chronic kidney disease, or unspecified chronic kidney disease: Secondary | ICD-10-CM | POA: Diagnosis not present

## 2020-05-20 DIAGNOSIS — N2581 Secondary hyperparathyroidism of renal origin: Secondary | ICD-10-CM | POA: Diagnosis not present

## 2020-05-20 DIAGNOSIS — N1831 Chronic kidney disease, stage 3a: Secondary | ICD-10-CM | POA: Diagnosis not present

## 2020-05-20 DIAGNOSIS — D631 Anemia in chronic kidney disease: Secondary | ICD-10-CM | POA: Diagnosis not present

## 2020-05-22 ENCOUNTER — Other Ambulatory Visit: Payer: Self-pay | Admitting: Nurse Practitioner

## 2020-05-22 DIAGNOSIS — D649 Anemia, unspecified: Secondary | ICD-10-CM

## 2020-05-22 NOTE — Progress Notes (Signed)
[facility]   [phone] [phone]   Clinic Follow up Note   Patient Care Team: Polite, Ronald, MD as PCP - General (Internal Medicine) [name], Peter C, MD as PCP - Cardiology (Cardiology) Bulakowski, Neill, OD as Consulting Physician (Optometry) Byerly, Faera, MD as Consulting Physician (General Surgery) Feng, Yan, MD as Consulting Physician (Hematology) [name], [name], MD as Consulting Physician (Nephrology) 05/23/2020  CHIEF COMPLAINT: Follow up right breast cancer, anemia of chronic disease   SUMMARY OF ONCOLOGIC HISTORY: Oncology History Overview Note  Cancer Staging Breast cancer of lower-inner quadrant of right female breast (HCC) Staging form: Breast, AJCC 7th Edition - Clinical: Stage IA (T1b, N0, cM0) - Unsigned Staging comments: Staged at breast conference 03/25/13.  - Pathologic stage from 05/12/2013: Stage IA (T1b, N0, cM0) - Signed by Yan Feng, MD on 06/21/2016     Breast cancer of lower-inner quadrant of right female breast (HCC)  02/27/2013 Mammogram   Diagnostic mammogram and ultrasound showed a 6 x 5 mm mass at 5:00 position. Axilla was negative by ultrasound.   03/16/2013 Initial Biopsy   Right breast 5:00 position biopsy showed invasive ductal carcinoma, low-grade   03/16/2013 Receptors her2   ER100% positive, PR 100% positive, HER-2 negative, Ki-67 20%   03/18/2013 Initial Diagnosis   Breast cancer of lower-inner quadrant of right female breast (HCC)   05/12/2013 Surgery   Right breast lumpectomy with sentinel lymph node biopsy   05/12/2013 Pathology Results   Right breast lumpectomy showed invasive ductal carcinoma, grade 2, 0.9 cm, 6 sentinel lymph nodes were all negative. Margins were negative.   05/12/2013 Oncotype testing   Oncotype recurrence score 19, intermediate risk, 10 year risk of distant recurrence 12% with tamoxifen for 5 years. Patient declined adjuvant chemotherapy.   06/02/2013 - 07/16/2013 Radiation  Therapy   Adjuvant breast radiation with boost 1600Gy   08/06/2013 - 07/2017 Anti-estrogen oral therapy   Arimidex 1 mg daily. Stopped in 07/2017 due to acute illness    03/22/2017 Mammogram   IMPRESSION: No mammographic evidence for malignancy.   Iron deficiency anemia    CURRENT THERAPY:  -Retacritinjection starting 09/2018, currently q2weeks. Increased to 20K unitsq2weeksstarting 06/29/19. 40Kunit every 2 weekson 08/13/19. Reduced to every 4 weeks from 02/01/20.  -IV Feraheme as needed if ferritin <100, starting 09/15/18 -Oral Ferrous sulfate daily -B12 injection monthly starting 05/04/19. Increased to every 2 weeks on 06/29/19.Reduced to every 4 weeks from 02/01/20.   INTERVAL HISTORY: Ms. Jasmer returns for follow up and inj as scheduled. She was last seen 02/01/20 and continues monthly B12 and retacrit. She is overdue for mammogram and DEXA.  She is doing well overall.  She is losing weight unintentionally, appetite is stable.  Over the last 3 weeks her breast has become darker, itching, and slightly swollen and painful.  She is not sure if she feels a lump since after lumpectomy her breast feels different.  Denies warmth, redness, fever or chills.  She continues oral iron, managing well with occasional constipation.  Denies bleeding.  Denies recent infection, cough, chest pain, new bone pain except stable arthritis pain in the right leg. All other systems were reviewed with the patient and are negative.  MEDICAL HISTORY:  Past Medical History:  Diagnosis Date  . Abdominal discomfort    Chronic N/V/D. Presumptive dx Crohn's dx per elevated p ANCA. Failed Entocort and Pentasa. Sep 2003 - ileocolectomy c anastomosis per Dr Bowman 2/2 adhesions - path was hegative for Crohns. EGD, Sm bowel   follow through (11/03), and an eteroclysis (10/03) were unrevealing. Cuases hypomag and hypocalcemia.  . Adnexal mass 8/03   s/p lap BSO (R ovarian fibroma) & lysis of adhesions  . Allergy    Seasonal   . Anemia    Multifactorial. Baseline HgB 10-11 ish. B12 def - 150 in 3/10. Fe Def - ferritin 35 3/10. Both are being repleted.  . Breast cancer (HCC) 03/16/13   right, 5 o'clock  . CAD (coronary artery disease) 1996   1996 - PTCA and angioplasty diagonal branch. 2000 - Rotoblator & angiopllasty of diagonal. 2006 - subendocardial AMI, DES to proximal LAD.. Also had 90% stenosis in distal apical LAD. EF 55 with apical hypokinesis. Indefinite ASA and Plavix.  . CHF (congestive heart failure) (HCC)   . Chronic kidney disease    Chronic renal insuff baseline Cr 1.2 - 1.4 ish.  . Chronic pain    CT 10/10 = Spinal stenosis L2 - S1.  . Diabetes mellitus    Insulin dependent  . Gout   . Hx of radiation therapy 06/02/13- 07/16/13   right rbeast 4500 cGy 25 sessions, right breast boost 1600 cGy in 8 sessions  . Hyperlipidemia    Managed with both a statin and Welchol. Welchol stopped 2014 2/2 cost and started on fenofibrate   . Hypertension    2006 B renal arteries patent. 2003 MRA - no RAS. 2003 pheo W/U Dr Fox reportedly negative.  . Hypoxia 07/23/2017  . Lupus (HCC)   . Lymphedema of breast   . Personal history of radiation therapy 2015  . RBBB   . Renal vein thrombosis (HCC)   . SBO (small bowel obstruction) (HCC) 09/17/2017  . Secondary hyperparathyroidism (HCC)   . Stroke (HCC)    Incidental finding MRI 2002 L lacunar infarct  . Vitamin B12 deficiency   . Vitamin D deficiency   . Wears dentures    top    SURGICAL HISTORY: Past Surgical History:  Procedure Laterality Date  . ABDOMINAL HYSTERECTOMY    . BILATERAL SALPINGOOPHORECTOMY  8/03   Lap BSO (R ovarian fibroma) and adhesion lysis  . BOWEL RESECTION  2003   ileocolectomy with anastomosis 2/2 adhesions  . BREAST BIOPSY Right 2015  . BREAST BIOPSY Right 07/2014  . BREAST LUMPECTOMY Right 04/22/2013  . BREAST LUMPECTOMY WITH NEEDLE LOCALIZATION AND AXILLARY SENTINEL LYMPH NODE BX Right 04/22/2013   Procedure: BREAST  LUMPECTOMY WITH NEEDLE LOCALIZATION AND AXILLARY SENTINEL LYMPH NODE BX;  Surgeon: Faera Byerly, MD;  Location: [facility];  Service: General;  Laterality: Right;  . CARDIAC CATHETERIZATION     2 stents  . CHOLECYSTECTOMY    . COLONOSCOPY    . HEMICOLECTOMY     R sided hemicolectomy  . HERNIA REPAIR     Ventral hernia repair  . PTCA  4/06    I have reviewed the social history and family history with the patient and they are unchanged from previous note.  ALLERGIES:  is allergic to percocet [oxycodone-acetaminophen]; diazepam; haloperidol lactate; lorazepam; morphine sulfate; propoxyphene hcl; tramadol hcl; anesthetics, halogenated; etomidate; fentanyl; and versed [midazolam].  MEDICATIONS:  Current Outpatient Medications  Medication Sig Dispense Refill  . acetaminophen (TYLENOL) 325 MG tablet Take 650 mg by mouth every 6 (six) hours as needed for mild pain.    . amLODipine (NORVASC) 10 MG tablet Take 1 tablet (10 mg total) by mouth daily. 30 tablet 0  . atorvastatin (LIPITOR) 40 MG tablet Take 40 mg by mouth daily.    .   budesonide (ENTOCORT EC) 3 MG 24 hr capsule Take 1 capsule by mouth once daily 30 capsule 3  . carvedilol (COREG) 25 MG tablet Take 25 mg by mouth 2 (two) times daily.    . colestipol (COLESTID) 1 g tablet Take 2 g by mouth at bedtime.    . desloratadine (CLARINEX) 5 MG tablet Take 5 mg by mouth at bedtime as needed (allergies). Reported on 04/11/2015    . ferrous fumarate (HEMOCYTE - 106 MG FE) 325 (106 Fe) MG TABS tablet Take 1 tablet by mouth as directed.    . gabapentin (NEURONTIN) 300 MG capsule Take 1 capsule by mouth at bedtime 30 capsule 0  . hydrALAZINE (APRESOLINE) 100 MG tablet TAKE 1 TABLET BY MOUTH 3  TIMES DAILY 270 tablet 2  . hydrOXYzine (ATARAX/VISTARIL) 50 MG tablet Take 50 mg by mouth 2 (two) times daily as needed.    . isosorbide mononitrate (IMDUR) 30 MG 24 hr tablet Take 1/2 (one-half) tablet by mouth once daily 45 tablet 6  . LANTUS  SOLOSTAR 100 UNIT/ML Solostar Pen SMARTSIG:14 Unit(s) SUB-Q Every Night    . linagliptin (TRADJENTA) 5 MG TABS tablet Take 5 mg by mouth daily.    . Melatonin 5 MG TABS Take 10 mg by mouth at bedtime.    . nitroGLYCERIN (NITROSTAT) 0.4 MG SL tablet DISSOLVE 1 TABLET UNDER THE TONGUE EVERY 5 MINUTES AS  NEEDED FOR CHEST PAIN. MAX  OF 3 TABLETS IN 15 MINUTES. CALL 911 IF PAIN PERSISTS. 100 tablet 3  . pantoprazole (PROTONIX) 40 MG tablet Take 1 tablet (40 mg total) by mouth daily before breakfast. 90 tablet 3  . torsemide (DEMADEX) 20 MG tablet Take 20 mg by mouth daily. 20 mg every other day, 40 mg days not taking 20 mg    . ULORIC 40 MG tablet Take 1 tablet by mouth daily.    . Vitamin D, Ergocalciferol, (DRISDOL) 1.25 MG (50000 UNIT) CAPS capsule Take 1 capsule (50,000 Units total) by mouth every 7 (seven) days. 8 capsule 0   No current facility-administered medications for this visit.    PHYSICAL EXAMINATION: ECOG PERFORMANCE STATUS: 1 - Symptomatic but completely ambulatory  Vitals:   05/23/20 0800  BP: (!) 154/69  Pulse: 72  Resp: 17  Temp: 97.8 F (36.6 C)  SpO2: 100%   Filed Weights   05/23/20 0800  Weight: 172 lb 4.8 oz (78.2 kg)    GENERAL:alert, no distress and comfortable SKIN: No rash EYES: sclera clear NECK: Without mass LYMPH:  no palpable cervical or supraclavicular lymphadenopathy  LUNGS:  normal breathing effort HEART: No lower extremity edema ABDOMEN:abdomen soft, non-tender and normal bowel sounds NEURO: alert & oriented x 3 with fluent speech, no focal motor/sensory deficits Breast exam: No bilateral nipple discharge or inversion.  S/p right lumpectomy, incisions completely healed.  Marked hyperpigmentation throughout the central and lower breast. Scattered densities without discrete mass. No warmth, redness, or significant edema. Left breast and axilla without palpable mass that I could appreciate  LABORATORY DATA:  I have reviewed the data as listed CBC  Latest Ref Rng & Units 05/23/2020 04/25/2020 03/28/2020  WBC 4.0 - 10.5 K/uL 7.6 7.1 5.6  Hemoglobin 12.0 - 15.0 g/dL 10.1(L) 9.2(L) 10.4(L)  Hematocrit 36.0 - 46.0 % 32.7(L) 28.9(L) 32.8(L)  Platelets 150 - 400 K/uL 231 166 212     CMP Latest Ref Rng & Units 05/23/2020 12/21/2019 07/21/2019  Glucose 70 - 99 mg/dL 126(H) 164(H) 152(H)  BUN 8 - 23   mg/dL 30(H) 39(H) 33(H)  Creatinine 0.44 - 1.00 mg/dL 1.84(H) 2.51(H) 1.62(H)  Sodium 135 - 145 mmol/L 141 141 144  Potassium 3.5 - 5.1 mmol/L 3.8 3.8 3.8  Chloride 98 - 111 mmol/L 108 110 114(H)  CO2 22 - 32 mmol/L 25 20(L) 21(L)  Calcium 8.9 - 10.3 mg/dL 9.2 9.3 8.4(L)  Total Protein 6.5 - 8.1 g/dL 7.6 7.4 6.3(L)  Total Bilirubin 0.3 - 1.2 mg/dL 0.4 0.3 0.3  Alkaline Phos 38 - 126 U/L 67 74 92  AST 15 - 41 U/L _0 ALT 0 - 44 U/L _1 RADIOGRAPHIC STUDIES: I have personally reviewed the radiological images as listed and agreed with the findings in the report. No results found.   ASSESSMENT & PLAN: [name] a 75 y.o.femalewith   1. Anemia of chronic disease (CKD), Ironand B12deficiency -04/2018 Colonoscopy with Dr Carlean Purl showed no evidence of GI bleedingor source of bleeding.Continue f/u with GI - 09/09/18 bone marrow biopsy showed hypercellular marrow, no evidence of MDS or other concerns. -she started oral iron 08/2018 and IV Feraheme PRN for ferritin <100 -Herinsurancedid not approve Aranesp so she started Retacrit every 2 weekson 11/14/18. She tolerates well. Goal is Hg 9-11. -She started B12 in our clinic in 04/2019  -Her anemia has worsened lately and required RBC transfusion on in 06/2019, her 08/2019 endoscopy by Dr. Carlean Purl showed esophagitis with bleeding. She started PPI -clinically doing well on q4 weeks retacrit and B12  2. Breast cancer of the lower inner quadrant of the right breast, invasive ductal carcinoma with DCIS, pT1bN0Mo, stage IA, ER+ and PR +, HER 2 negative -Diagnosed in  02/2013 and treated with right lumpectomy, adjuvant radiation andAnastrozolefor 4 years until5/2019.Anastrozole was stopped after she developed septic shock from pneumonia and a cardiac arrest,and it was decided not restarted due to the risk of cardiac toxicity. -mammogram 04/17/19 negative -continue surveillance   3. Low Backpain, from degenerative arthritis and spinal stenosis - f/u with Orthopedics -she has stable right lower extremity arthritic pain. Denies new pain   4. CAD, HTN, DM, CKD stage IV -Continue to follow up withPCP,Dr. [name](cardiology), and Dr. Hollie Salk (nephrology)  5. Genetics results negative  6. Bone Health  -DEXA from 04/2018 shows slightly improved normal bone density with lowest T-score of 0.9 at forearm radius -on calcium and vitamin D -next DEXA scheduled 07/2020   7. Left Renal Vein Thrombosisin 07/2017 -Likely provoked from acute illnessand prior SBO -She stopped Xarelto in 01/2018 -Her hypercoagulopathy work-up in November 2019 was negative  Disposition:  Ms. Wayment is clinically doing well. Labs including CBC, B12, iron, and CMP are stable on monthly retacrit 40K units and B12. She does not need additional IV iron at this time. Continue q4 week retacrit and B12 at same dose.   From a breast cancer standpoint, she is clinically doing well. Breast exam shows hyperpigmentation with itching and scattered densities in the central and lower right breast. We reviewed symptom management. This likely reflects post lumpectomy and radiation changes. We also discussed possible scar tissue, fibroglandular tissue, and/or seroma.  I have a low suspicion for cellulitis/mastitis or recurrence. I don't feel she needs skin biopsy. She is being referred for diagnostic mammogram (she is overdue for annual bilateral) and right breast US. Will call her with results.   If work up is negative, we will see her back in 4 months for routine visit.    Orders Placed  This  Encounter  Procedures  . US BREAST LTD UNI RIGHT INC AXILLA    INS AARP [employer] MCR PF  04/17/19 @ [facility] ORDER COSIGN Carcinoma of lower-inner quadrant of breast, right ([place])  SX YESHX BREAST CA /NO  IMPLANTS/ NO NEEDS COVID VAX  BOOSTER ? NO LC/ STEPHANIE @ [facility]     Standing Status:   Future    Standing Expiration Date:   05/23/2021    Order Specific Question:   Reason for Exam (SYMPTOM  OR DIAGNOSIS REQUIRED)    Answer:   h/o right breast cancer, new right breast pain, darkening    Order Specific Question:   Preferred imaging location?    Answer:   [facility]   All questions were answered. The patient knows to call the clinic with any problems, questions or concerns. No barriers to learning were detected. Total encounter time was 30 minutes.      Alla Feeling, NP 05/23/20

## 2020-05-23 ENCOUNTER — Inpatient Hospital Stay: Payer: Medicare Other | Attending: Internal Medicine

## 2020-05-23 ENCOUNTER — Encounter: Payer: Self-pay | Admitting: Nurse Practitioner

## 2020-05-23 ENCOUNTER — Other Ambulatory Visit: Payer: Self-pay | Admitting: Hematology

## 2020-05-23 ENCOUNTER — Inpatient Hospital Stay: Payer: Medicare Other

## 2020-05-23 ENCOUNTER — Telehealth: Payer: Self-pay | Admitting: Nurse Practitioner

## 2020-05-23 ENCOUNTER — Inpatient Hospital Stay (HOSPITAL_BASED_OUTPATIENT_CLINIC_OR_DEPARTMENT_OTHER): Payer: Medicare Other | Admitting: Nurse Practitioner

## 2020-05-23 ENCOUNTER — Other Ambulatory Visit: Payer: Self-pay

## 2020-05-23 VITALS — BP 154/69 | HR 72 | Temp 97.8°F | Resp 17 | Wt 172.3 lb

## 2020-05-23 VITALS — BP 167/67 | HR 70 | Temp 98.1°F | Resp 17

## 2020-05-23 DIAGNOSIS — E538 Deficiency of other specified B group vitamins: Secondary | ICD-10-CM | POA: Diagnosis not present

## 2020-05-23 DIAGNOSIS — D509 Iron deficiency anemia, unspecified: Secondary | ICD-10-CM

## 2020-05-23 DIAGNOSIS — N184 Chronic kidney disease, stage 4 (severe): Secondary | ICD-10-CM

## 2020-05-23 DIAGNOSIS — D631 Anemia in chronic kidney disease: Secondary | ICD-10-CM

## 2020-05-23 DIAGNOSIS — C50311 Malignant neoplasm of lower-inner quadrant of right female breast: Secondary | ICD-10-CM | POA: Diagnosis not present

## 2020-05-23 DIAGNOSIS — D649 Anemia, unspecified: Secondary | ICD-10-CM

## 2020-05-23 DIAGNOSIS — Z170 Estrogen receptor positive status [ER+]: Secondary | ICD-10-CM

## 2020-05-23 LAB — CMP (CANCER CENTER ONLY)
ALT: 18 U/L (ref 0–44)
AST: 15 U/L (ref 15–41)
Albumin: 3.4 g/dL — ABNORMAL LOW (ref 3.5–5.0)
Alkaline Phosphatase: 67 U/L (ref 38–126)
Anion gap: 8 (ref 5–15)
BUN: 30 mg/dL — ABNORMAL HIGH (ref 8–23)
CO2: 25 mmol/L (ref 22–32)
Calcium: 9.2 mg/dL (ref 8.9–10.3)
Chloride: 108 mmol/L (ref 98–111)
Creatinine: 1.84 mg/dL — ABNORMAL HIGH (ref 0.44–1.00)
GFR, Estimated: 29 mL/min — ABNORMAL LOW (ref >60)
Glucose, Bld: 126 mg/dL — ABNORMAL HIGH (ref 70–99)
Potassium: 3.8 mmol/L (ref 3.5–5.1)
Sodium: 141 mmol/L (ref 135–145)
Total Bilirubin: 0.4 mg/dL (ref 0.3–1.2)
Total Protein: 7.6 g/dL (ref 6.5–8.1)

## 2020-05-23 LAB — CBC WITH DIFFERENTIAL (CANCER CENTER ONLY)
Abs Immature Granulocytes: 0.04 10*3/uL (ref 0.00–0.07)
Basophils Absolute: 0 10*3/uL (ref 0.0–0.1)
Basophils Relative: 0 %
Eosinophils Absolute: 0.1 10*3/uL (ref 0.0–0.5)
Eosinophils Relative: 1 %
HCT: 32.7 % — ABNORMAL LOW (ref 36.0–46.0)
Hemoglobin: 10.1 g/dL — ABNORMAL LOW (ref 12.0–15.0)
Immature Granulocytes: 1 %
Lymphocytes Relative: 19 %
Lymphs Abs: 1.5 10*3/uL (ref 0.7–4.0)
MCH: 27.2 pg (ref 26.0–34.0)
MCHC: 30.9 g/dL (ref 30.0–36.0)
MCV: 88.1 fL (ref 80.0–100.0)
Monocytes Absolute: 0.6 10*3/uL (ref 0.1–1.0)
Monocytes Relative: 8 %
Neutro Abs: 5.4 10*3/uL (ref 1.7–7.7)
Neutrophils Relative %: 71 %
Platelet Count: 231 10*3/uL (ref 150–400)
RBC: 3.71 MIL/uL — ABNORMAL LOW (ref 3.87–5.11)
RDW: 15 % (ref 11.5–15.5)
WBC Count: 7.6 10*3/uL (ref 4.0–10.5)
nRBC: 0 % (ref 0.0–0.2)

## 2020-05-23 LAB — IRON AND TIBC
Iron: 66 ug/dL (ref 41–142)
Saturation Ratios: 28 % (ref 21–57)
TIBC: 234 ug/dL — ABNORMAL LOW (ref 236–444)
UIBC: 168 ug/dL (ref 120–384)

## 2020-05-23 LAB — FERRITIN: Ferritin: 370 ng/mL — ABNORMAL HIGH (ref 11–307)

## 2020-05-23 LAB — VITAMIN B12: Vitamin B-12: 861 pg/mL (ref 180–914)

## 2020-05-23 MED ORDER — CYANOCOBALAMIN 1000 MCG/ML IJ SOLN
INTRAMUSCULAR | Status: AC
  Filled 2020-05-23: qty 1

## 2020-05-23 MED ORDER — EPOETIN ALFA-EPBX 40000 UNIT/ML IJ SOLN
40000.0000 [IU] | Freq: Once | INTRAMUSCULAR | Status: AC
  Administered 2020-05-23: 40000 [IU] via SUBCUTANEOUS

## 2020-05-23 MED ORDER — EPOETIN ALFA-EPBX 40000 UNIT/ML IJ SOLN
INTRAMUSCULAR | Status: AC
  Filled 2020-05-23: qty 1

## 2020-05-23 MED ORDER — CYANOCOBALAMIN 1000 MCG/ML IJ SOLN
1000.0000 ug | Freq: Once | INTRAMUSCULAR | Status: AC
  Administered 2020-05-23: 1000 ug via INTRAMUSCULAR

## 2020-05-23 NOTE — Telephone Encounter (Signed)
Scheduled appt per 3/7 los - pt is aware of  Next scheduled appts.

## 2020-05-23 NOTE — Progress Notes (Signed)
Okay to administer retacrit with Hgb 10.1 per Dr. Burr Medico.

## 2020-05-23 NOTE — Patient Instructions (Signed)
Cyanocobalamin, Vitamin B12 injection What is this medicine? CYANOCOBALAMIN (sye an oh koe BAL a min) is a man made form of vitamin B12. Vitamin B12 is used in the growth of healthy blood cells, nerve cells, and proteins in the body. It also helps with the metabolism of fats and carbohydrates. This medicine is used to treat people who can not absorb vitamin B12. This medicine may be used for other purposes; ask your health care provider or pharmacist if you have questions. COMMON BRAND NAME(S): B-12 Compliance Kit, B-12 Injection Kit, Cyomin, LA-12, Nutri-Twelve, Physicians EZ Use B-12, Primabalt What should I tell my health care provider before I take this medicine? They need to know if you have any of these conditions:  kidney disease  Leber's disease  megaloblastic anemia  an unusual or allergic reaction to cyanocobalamin, cobalt, other medicines, foods, dyes, or preservatives  pregnant or trying to get pregnant  breast-feeding How should I use this medicine? This medicine is injected into a muscle or deeply under the skin. It is usually given by a health care professional in a clinic or doctor's office. However, your doctor may teach you how to inject yourself. Follow all instructions. Talk to your pediatrician regarding the use of this medicine in children. Special care may be needed. Overdosage: If you think you have taken too much of this medicine contact a poison control center or emergency room at once. NOTE: This medicine is only for you. Do not share this medicine with others. What if I miss a dose? If you are given your dose at a clinic or doctor's office, call to reschedule your appointment. If you give your own injections and you miss a dose, take it as soon as you can. If it is almost time for your next dose, take only that dose. Do not take double or extra doses. What may interact with this medicine?  colchicine  heavy alcohol intake This list may not describe all  possible interactions. Give your health care provider a list of all the medicines, herbs, non-prescription drugs, or dietary supplements you use. Also tell them if you smoke, drink alcohol, or use illegal drugs. Some items may interact with your medicine. What should I watch for while using this medicine? Visit your doctor or health care professional regularly. You may need blood work done while you are taking this medicine. You may need to follow a special diet. Talk to your doctor. Limit your alcohol intake and avoid smoking to get the best benefit. What side effects may I notice from receiving this medicine? Side effects that you should report to your doctor or health care professional as soon as possible:  allergic reactions like skin rash, itching or hives, swelling of the face, lips, or tongue  blue tint to skin  chest tightness, pain  difficulty breathing, wheezing  dizziness  red, swollen painful area on the leg Side effects that usually do not require medical attention (report to your doctor or health care professional if they continue or are bothersome):  diarrhea  headache This list may not describe all possible side effects. Call your doctor for medical advice about side effects. You may report side effects to FDA at 1-800-FDA-1088. Where should I keep my medicine? Keep out of the reach of children. Store at room temperature between 15 and 30 degrees C (59 and 85 degrees F). Protect from light. Throw away any unused medicine after the expiration date. NOTE: This sheet is a summary. It may not cover  all possible information. If you have questions about this medicine, talk to your doctor, pharmacist, or health care provider.  2021 Elsevier/Gold Standard (2007-06-16 22:10:20) Epoetin Alfa injection What is this medicine? EPOETIN ALFA (e POE e tin AL fa) helps your body make more red blood cells. This medicine is used to treat anemia caused by chronic kidney disease, cancer  chemotherapy, or HIV-therapy. It may also be used before surgery if you have anemia. This medicine may be used for other purposes; ask your health care provider or pharmacist if you have questions. COMMON BRAND NAME(S): Epogen, Procrit, Retacrit What should I tell my health care provider before I take this medicine? They need to know if you have any of these conditions:  cancer  heart disease  high blood pressure  history of blood clots  history of stroke  low levels of folate, iron, or vitamin B12 in the blood  seizures  an unusual or allergic reaction to erythropoietin, albumin, benzyl alcohol, hamster proteins, other medicines, foods, dyes, or preservatives  pregnant or trying to get pregnant  breast-feeding How should I use this medicine? This medicine is for injection into a vein or under the skin. It is usually given by a health care professional in a hospital or clinic setting. If you get this medicine at home, you will be taught how to prepare and give this medicine. Use exactly as directed. Take your medicine at regular intervals. Do not take your medicine more often than directed. It is important that you put your used needles and syringes in a special sharps container. Do not put them in a trash can. If you do not have a sharps container, call your pharmacist or healthcare provider to get one. A special MedGuide will be given to you by the pharmacist with each prescription and refill. Be sure to read this information carefully each time. Talk to your pediatrician regarding the use of this medicine in children. While this drug may be prescribed for selected conditions, precautions do apply. Overdosage: If you think you have taken too much of this medicine contact a poison control center or emergency room at once. NOTE: This medicine is only for you. Do not share this medicine with others. What if I miss a dose? If you miss a dose, take it as soon as you can. If it is  almost time for your next dose, take only that dose. Do not take double or extra doses. What may interact with this medicine? Interactions have not been studied. This list may not describe all possible interactions. Give your health care provider a list of all the medicines, herbs, non-prescription drugs, or dietary supplements you use. Also tell them if you smoke, drink alcohol, or use illegal drugs. Some items may interact with your medicine. What should I watch for while using this medicine? Your condition will be monitored carefully while you are receiving this medicine. You may need blood work done while you are taking this medicine. This medicine may cause a decrease in vitamin B6. You should make sure that you get enough vitamin B6 while you are taking this medicine. Discuss the foods you eat and the vitamins you take with your health care professional. What side effects may I notice from receiving this medicine? Side effects that you should report to your doctor or health care professional as soon as possible:  allergic reactions like skin rash, itching or hives, swelling of the face, lips, or tongue  seizures  signs and symptoms of   a blood clot such as breathing problems; changes in vision; chest pain; severe, sudden headache; pain, swelling, warmth in the leg; trouble speaking; sudden numbness or weakness of the face, arm or leg  signs and symptoms of a stroke like changes in vision; confusion; trouble speaking or understanding; severe headaches; sudden numbness or weakness of the face, arm or leg; trouble walking; dizziness; loss of balance or coordination Side effects that usually do not require medical attention (report to your doctor or health care professional if they continue or are bothersome):  chills  cough  dizziness  fever  headaches  joint pain  muscle cramps  muscle pain  nausea, vomiting  pain, redness, or irritation at site where injected This list may  not describe all possible side effects. Call your doctor for medical advice about side effects. You may report side effects to FDA at 1-800-FDA-1088. Where should I keep my medicine? Keep out of the reach of children. Store in a refrigerator between 2 and 8 degrees C (36 and 46 degrees F). Do not freeze or shake. Throw away any unused portion if using a single-dose vial. Multi-dose vials can be kept in the refrigerator for up to 21 days after the initial dose. Throw away unused medicine. NOTE: This sheet is a summary. It may not cover all possible information. If you have questions about this medicine, talk to your doctor, pharmacist, or health care provider.  2021 Elsevier/Gold Standard (2016-10-12 08:35:19)  

## 2020-05-24 DIAGNOSIS — N184 Chronic kidney disease, stage 4 (severe): Secondary | ICD-10-CM | POA: Diagnosis not present

## 2020-05-24 DIAGNOSIS — Z794 Long term (current) use of insulin: Secondary | ICD-10-CM | POA: Diagnosis not present

## 2020-05-24 DIAGNOSIS — Z853 Personal history of malignant neoplasm of breast: Secondary | ICD-10-CM | POA: Diagnosis not present

## 2020-05-24 DIAGNOSIS — D638 Anemia in other chronic diseases classified elsewhere: Secondary | ICD-10-CM | POA: Diagnosis not present

## 2020-05-24 DIAGNOSIS — E1122 Type 2 diabetes mellitus with diabetic chronic kidney disease: Secondary | ICD-10-CM | POA: Diagnosis not present

## 2020-05-24 DIAGNOSIS — I2510 Atherosclerotic heart disease of native coronary artery without angina pectoris: Secondary | ICD-10-CM | POA: Diagnosis not present

## 2020-05-24 DIAGNOSIS — I10 Essential (primary) hypertension: Secondary | ICD-10-CM | POA: Diagnosis not present

## 2020-05-24 DIAGNOSIS — E7800 Pure hypercholesterolemia, unspecified: Secondary | ICD-10-CM | POA: Diagnosis not present

## 2020-05-27 ENCOUNTER — Other Ambulatory Visit: Payer: Medicare Other

## 2020-06-05 DIAGNOSIS — M25511 Pain in right shoulder: Secondary | ICD-10-CM | POA: Diagnosis not present

## 2020-06-05 DIAGNOSIS — M25521 Pain in right elbow: Secondary | ICD-10-CM | POA: Diagnosis not present

## 2020-06-05 DIAGNOSIS — M25551 Pain in right hip: Secondary | ICD-10-CM | POA: Diagnosis not present

## 2020-06-08 DIAGNOSIS — M5431 Sciatica, right side: Secondary | ICD-10-CM | POA: Diagnosis not present

## 2020-06-10 DIAGNOSIS — M5441 Lumbago with sciatica, right side: Secondary | ICD-10-CM | POA: Diagnosis not present

## 2020-06-10 DIAGNOSIS — T148XXA Other injury of unspecified body region, initial encounter: Secondary | ICD-10-CM | POA: Diagnosis not present

## 2020-06-14 ENCOUNTER — Emergency Department (HOSPITAL_COMMUNITY)
Admission: EM | Admit: 2020-06-14 | Discharge: 2020-06-14 | Disposition: A | Discharge status: Home / Self Care | Payer: Medicare Other | Attending: Emergency Medicine | Admitting: Emergency Medicine

## 2020-06-14 ENCOUNTER — Emergency Department (HOSPITAL_COMMUNITY): Payer: Medicare Other

## 2020-06-14 ENCOUNTER — Other Ambulatory Visit: Payer: Self-pay

## 2020-06-14 ENCOUNTER — Encounter (HOSPITAL_COMMUNITY): Payer: Self-pay

## 2020-06-14 DIAGNOSIS — Z79899 Other long term (current) drug therapy: Secondary | ICD-10-CM | POA: Insufficient documentation

## 2020-06-14 DIAGNOSIS — I130 Hypertensive heart and chronic kidney disease with heart failure and stage 1 through stage 4 chronic kidney disease, or unspecified chronic kidney disease: Secondary | ICD-10-CM | POA: Insufficient documentation

## 2020-06-14 DIAGNOSIS — Z853 Personal history of malignant neoplasm of breast: Secondary | ICD-10-CM | POA: Diagnosis not present

## 2020-06-14 DIAGNOSIS — M25551 Pain in right hip: Secondary | ICD-10-CM | POA: Diagnosis not present

## 2020-06-14 DIAGNOSIS — I2510 Atherosclerotic heart disease of native coronary artery without angina pectoris: Secondary | ICD-10-CM | POA: Insufficient documentation

## 2020-06-14 DIAGNOSIS — M79604 Pain in right leg: Secondary | ICD-10-CM | POA: Insufficient documentation

## 2020-06-14 DIAGNOSIS — W010XXA Fall on same level from slipping, tripping and stumbling without subsequent striking against object, initial encounter: Secondary | ICD-10-CM | POA: Diagnosis not present

## 2020-06-14 DIAGNOSIS — E1122 Type 2 diabetes mellitus with diabetic chronic kidney disease: Secondary | ICD-10-CM | POA: Insufficient documentation

## 2020-06-14 DIAGNOSIS — Z7984 Long term (current) use of oral hypoglycemic drugs: Secondary | ICD-10-CM | POA: Insufficient documentation

## 2020-06-14 DIAGNOSIS — M545 Low back pain, unspecified: Secondary | ICD-10-CM | POA: Insufficient documentation

## 2020-06-14 DIAGNOSIS — I509 Heart failure, unspecified: Secondary | ICD-10-CM | POA: Insufficient documentation

## 2020-06-14 DIAGNOSIS — N183 Chronic kidney disease, stage 3 unspecified: Secondary | ICD-10-CM | POA: Diagnosis not present

## 2020-06-14 MED ORDER — HYDROCODONE-ACETAMINOPHEN 5-325 MG PO TABS
1.0000 | ORAL_TABLET | Freq: Once | ORAL | Status: AC
  Administered 2020-06-14: 1 via ORAL
  Filled 2020-06-14: qty 1

## 2020-06-14 MED ORDER — HYDROMORPHONE HCL 2 MG PO TABS: 2.0000 mg | ORAL_TABLET | Freq: Four times a day (QID) | ORAL | 0 refills | Status: DC | PRN

## 2020-06-14 NOTE — Discharge Instructions (Addendum)
If you develop worsening, recurrent, or continued back pain, numbness or weakness in the legs, incontinence of your bowels or bladders, numbness of your buttocks, fever, abdominal pain, or any other new/concerning symptoms then return to the ER for evaluation.

## 2020-06-14 NOTE — ED Triage Notes (Signed)
Patient complains of 2 recent falls the past few weeks and is having increased back pain with radiation to right leg. Patient describes as mechanical falls. Alert and oriented

## 2020-06-14 NOTE — ED Provider Notes (Signed)
[facility] EMERGENCY DEPARTMENT Provider Note   CSN: [id number] Arrival date & time: 06/14/20  2505     History Chief Complaint  Patient presents with  . Back Pain    [name] is a 75 y.o. female.  HPI 75 year old female presents with right leg pain.  2-3 weeks ago she fell while tripping and holding a bunch of things.  Landed on her right side and at first only her shoulder hurt but the next day her low back and leg have been hurting.  The shoulder seems to be better though still has some discomfort.  Primarily the pain is on her leg which she describes as her entire leg though when trying to localize it it seems like its worst near her hip.  She has been able to ambulate with her cane.  She was given tizanidine by orthopedist and had back x-rays but states she is still having pain so she came in.  No weakness or numbness in the extremities.  No bowel/bladder incontinence. Has chronic bilateral lower extremity edema, but no new swelling or unilateral swelling  Past Medical History:  Diagnosis Date  . Abdominal discomfort    Chronic N/V/D. Presumptive dx Crohn's dx per elevated p ANCA. Failed Entocort and Pentasa. Sep 2003 - ileocolectomy c anastomosis per Dr Deon Pilling 2/2 adhesions - path was hegative for Crohns. EGD, Sm bowel follow through (11/03), and an eteroclysis (10/03) were unrevealing. Cuases hypomag and hypocalcemia.  . Adnexal mass 8/03   s/p lap BSO (R ovarian fibroma) & lysis of adhesions  . Allergy    Seasonal  . Anemia    Multifactorial. Baseline HgB 10-11 ish. B12 def - 150 in 3/10. Fe Def - ferritin 35 3/10. Both are being repleted.  . Breast cancer ([place]) 03/16/13   right, 5 o'clock  . CAD (coronary artery disease) 1996   1996 - PTCA and angioplasty diagonal branch. 2000 - Rotoblator & angiopllasty of diagonal. 2006 - subendocardial AMI, DES to proximal LAD.Marland Kitchen Also had 90% stenosis in distal apical LAD. EF 55 with apical hypokinesis.  Indefinite ASA and Plavix.  . CHF (congestive heart failure) ([place])   . Chronic kidney disease    Chronic renal insuff baseline Cr 1.2 - 1.4 ish.  . Chronic pain    CT 10/10 = Spinal stenosis L2 - S1.  . Diabetes mellitus    Insulin dependent  . Gout   . Hx of radiation therapy 06/02/13- 07/16/13   right rbeast 4500 cGy 25 sessions, right breast boost 1600 cGy in 8 sessions  . Hyperlipidemia    Managed with both a statin and Welchol. Welchol stopped 2014 2/2 cost and started on fenofibrate   . Hypertension    2006 B renal arteries patent. 2003 MRA - no RAS. 2003 pheo W/U Dr Hassell Done reportedly negative.  . Hypoxia 07/23/2017  . Lupus ([place])   . Lymphedema of breast   . Personal history of radiation therapy 2015  . RBBB   . Renal vein thrombosis ([place])   . SBO (small bowel obstruction) ([place]) 09/17/2017  . Secondary hyperparathyroidism ([place])   . Stroke [facility])    Incidental finding MRI 2002 L lacunar infarct  . Vitamin B12 deficiency   . Vitamin D deficiency   . Wears dentures    top    Patient Active Problem List   Diagnosis Date Noted  . Chronic diarrhea - responds to budesonide 09/10/2019  . Iron deficiency anemia 08/19/2018  . Spondylosis of lumbar  spine 12/24/2017  . Renal vein thrombosis ([place]) 09/17/2017  . Hoarse voice quality 09/09/2017  . Low back pain without sciatica 09/09/2017  . Labile blood glucose   . Sleep disturbance   . Leukocytosis   . Essential hypertension   . Urinary frequency   . Type 2 diabetes mellitus with peripheral neuropathy (HCC)   . Benign essential HTN   . Stage 2 chronic kidney disease   . Dysphagia   . Debility   . Encephalopathy   . Pressure injury of skin 08/04/2017  . Pneumonia of left lung due to infectious organism   . Diffuse pulmonary alveolar hemorrhage   . SOB (shortness of breath) 10/11/2016  . Hyperparathyroidism, secondary renal ([place]) 05/02/2016  . Gout 04/11/2015  . Diabetic neuropathy associated with type 2 diabetes mellitus  ([place]) 01/06/2015  . SBO (small bowel obstruction) ([place]) 09/15/2013  . Breast cancer of lower-inner quadrant of right female breast ([place]) 03/18/2013  . Chronic venous insufficiency 01/06/2013  . Health care maintenance 05/08/2011  . Diabetes mellitus type 2 in obese ([place]) 05/03/2010  . Seasonal allergies 05/03/2010  . Labile hypertension   . CAD (coronary artery disease)   . Abdominal discomfort   . Anemia in chronic kidney disease   . History of stroke without residual deficits   . Hyperlipidemia associated with type 2 diabetes mellitus ([place])   . Chronic pain   . Chronic kidney disease, stage III (moderate) (HCC)   . Adnexal mass   . Spinal stenosis, lumbar region, with neurogenic claudication 01/03/2009    Past Surgical History:  Procedure Laterality Date  . ABDOMINAL HYSTERECTOMY    . BILATERAL SALPINGOOPHORECTOMY  8/03   Lap BSO (R ovarian fibroma) and adhesion lysis  . BOWEL RESECTION  2003   ileocolectomy with anastomosis 2/2 adhesions  . BREAST BIOPSY Right 2015  . BREAST BIOPSY Right 07/2014  . BREAST LUMPECTOMY Right 04/22/2013  . BREAST LUMPECTOMY WITH NEEDLE LOCALIZATION AND AXILLARY SENTINEL LYMPH NODE BX Right 04/22/2013   Procedure: BREAST LUMPECTOMY WITH NEEDLE LOCALIZATION AND AXILLARY SENTINEL LYMPH NODE BX;  Surgeon: Stark Klein, MD;  Location: [place];  Service: General;  Laterality: Right;  . CARDIAC CATHETERIZATION     2 stents  . CHOLECYSTECTOMY    . COLONOSCOPY    . HEMICOLECTOMY     R sided hemicolectomy  . HERNIA REPAIR     Ventral hernia repair  . PTCA  4/06     OB History   No obstetric history on file.     Family History  Problem Relation Age of Onset  . Pancreatic cancer Brother 34  . Breast cancer Mother 53  . Lung cancer Maternal Aunt   . Breast cancer Maternal Aunt   . Prostate cancer Maternal Uncle   . Heart attack Maternal Grandmother   . Breast cancer Maternal Grandmother   . Colon cancer Maternal Grandmother    . Clotting disorder Maternal Grandmother   . Diabetes Maternal Grandmother   . Kidney failure Maternal Aunt   . Hypertension Daughter     Social History   Tobacco Use  . Smoking status: Never Smoker  . Smokeless tobacco: Never Used  Vaping Use  . Vaping Use: Never used  Substance Use Topics  . Alcohol use: No    Alcohol/week: 0.0 standard drinks  . Drug use: No    Home Medications Prior to Admission medications   Medication Sig Start Date End Date Taking? Authorizing Provider  HYDROmorphone (DILAUDID) 2 MG  tablet Take 1 tablet (2 mg total) by mouth every 6 (six) hours as needed for severe pain. 06/14/20  Yes Sherwood Gambler, MD  acetaminophen (TYLENOL) 325 MG tablet Take 650 mg by mouth every 6 (six) hours as needed for mild pain.    [provider]  amLODipine (NORVASC) 10 MG tablet Take 1 tablet (10 mg total) by mouth daily. 08/22/17   Angiulli, Lavon Paganini, PA-C  atorvastatin (LIPITOR) 40 MG tablet Take 40 mg by mouth daily. 07/27/19   [provider]  budesonide (ENTOCORT EC) 3 MG 24 hr capsule Take 1 capsule by mouth once daily 04/06/20   Gatha Mayer, MD  carvedilol (COREG) 25 MG tablet Take 25 mg by mouth 2 (two) times daily. 06/08/19   [provider]  colestipol (COLESTID) 1 g tablet Take 2 g by mouth at bedtime. 03/17/19   [provider]  desloratadine (CLARINEX) 5 MG tablet Take 5 mg by mouth at bedtime as needed (allergies). Reported on 04/11/2015 04/10/13   [provider]  ferrous fumarate (HEMOCYTE - 106 MG FE) 325 (106 Fe) MG TABS tablet Take 1 tablet by mouth as directed.    [provider]  gabapentin (NEURONTIN) 300 MG capsule Take 1 capsule by mouth at bedtime 02/22/19   Kirsteins, Luanna Salk, MD  hydrALAZINE (APRESOLINE) 100 MG tablet TAKE 1 TABLET BY MOUTH 3  TIMES DAILY 01/20/20   Josue Hector, MD  hydrOXYzine (ATARAX/VISTARIL) 50 MG tablet Take 50 mg by mouth 2 (two) times daily as needed. 03/16/19   [provider]  isosorbide mononitrate (IMDUR) 30 MG 24 hr tablet Take 1/2 (one-half) tablet by mouth once daily 02/13/19   Isaiah Serge, NP  LANTUS SOLOSTAR 100 UNIT/ML Solostar Pen SMARTSIG:14 Unit(s) SUB-Q Every Night 02/26/19   [provider]  linagliptin (TRADJENTA) 5 MG TABS tablet Take 5 mg by mouth daily.    [provider]  Melatonin 5 MG TABS Take 10 mg by mouth at bedtime.    [provider]  nitroGLYCERIN (NITROSTAT) 0.4 MG SL tablet DISSOLVE 1 TABLET UNDER THE TONGUE EVERY 5 MINUTES AS  NEEDED FOR CHEST PAIN. MAX  OF 3 TABLETS IN 15 MINUTES. CALL 911 IF PAIN PERSISTS. 10/28/19   Josue Hector, MD  pantoprazole (PROTONIX) 40 MG tablet Take 1 tablet (40 mg total) by mouth daily before breakfast. 08/27/19   Gatha Mayer, MD  torsemide (DEMADEX) 20 MG tablet Take 20 mg by mouth daily. 20 mg every other day, 40 mg days not taking 20 mg 08/05/19   [provider]  ULORIC 40 MG tablet Take 1 tablet by mouth daily. 06/20/16   [provider]  Vitamin D, Ergocalciferol, (DRISDOL) 1.25 MG (50000 UNIT) CAPS capsule Take 1 capsule (50,000 Units total) by mouth every 7 (seven) days. 08/13/19   Truitt Merle, MD    Allergies    Percocet [oxycodone-acetaminophen]; Diazepam; Haloperidol lactate; Lorazepam; Morphine sulfate; Propoxyphene hcl; Tramadol hcl; Anesthetics, halogenated; Etomidate; Fentanyl; and Versed [midazolam]  Review of Systems   Review of Systems  Musculoskeletal: Positive for arthralgias and back pain.  Neurological: Negative for weakness and numbness.  All other systems reviewed and are negative.   Physical Exam Updated Vital Signs BP (!) 177/62 (BP Location: Right Arm)   Pulse 77   Temp 97.8 F (36.6 C) (Oral)   Resp 16   SpO2 100%   Physical Exam Vitals and nursing note reviewed.  Constitutional:  Appearance: She is well-developed.  HENT:     Head: Normocephalic and atraumatic.     Right Ear: External ear normal.      Left Ear: External ear normal.     Nose: Nose normal.  Eyes:     General:        Right eye: No discharge.        Left eye: No discharge.  Cardiovascular:     Rate and Rhythm: Normal rate and regular rhythm.     Pulses:          Dorsalis pedis pulses are 2+ on the right side.     Heart sounds: Normal heart sounds.  Pulmonary:     Effort: Pulmonary effort is normal.     Breath sounds: Normal breath sounds.  Abdominal:     General: There is no distension.     Palpations: Abdomen is soft.     Tenderness: There is no abdominal tenderness.  Musculoskeletal:     Right shoulder: No tenderness. Normal range of motion.     Cervical back: No tenderness.     Thoracic back: No tenderness.     Lumbar back: No tenderness.     Right hip: Tenderness (lateral) present. No deformity. Normal range of motion.     Right upper leg: No swelling or tenderness.     Right knee: Normal range of motion. No tenderness.     Right lower leg: No swelling or tenderness.  Skin:    General: Skin is warm and dry.  Neurological:     Mental Status: She is alert.     Comments: 5/5 strength in BLE, grossly normal sensation  Psychiatric:        Mood and Affect: Mood is not anxious.     ED Results / Procedures / Treatments   Labs (all labs ordered are listed, but only abnormal results are displayed) Labs Reviewed - No data to display  EKG None  Radiology DG Hip Unilat W or Wo Pelvis 2-3 Views Right  Result Date: 06/14/2020 CLINICAL DATA:  RIGHT hip pain post fall 2 weeks ago, pain down RIGHT leg EXAM: DG HIP (WITH OR WITHOUT PELVIS) 2-3V RIGHT COMPARISON:  None FINDINGS: Osseous demineralization. Joint spaces preserved. No acute fracture, dislocation, or bone destruction. Scattered atherosclerotic calcifications. IMPRESSION: No acute osseous abnormalities. Electronically Signed   By: Lavonia Dana M.D.   On: 06/14/2020 12:09    Procedures Procedures   Medications Ordered in ED Medications   HYDROcodone-acetaminophen (NORCO/VICODIN) 5-325 MG per tablet 1 tablet (1 tablet Oral Given 06/14/20 1108)    ED Course  I have reviewed the triage vital signs and the nursing notes.  Pertinent labs & imaging results that were available during my care of the patient were reviewed by me and considered in my medical decision making (see chart for details).    MDM Rules/Calculators/A&P                          Patient's hip x-ray is unremarkable.  I personally reviewed these images.  Given her reported negative back x-ray, I do not think further imaging in the ED is warranted.  If continues to have pain may need MRI as an outpatient but at this point given she is able to bear weight I do not think she has an occult fracture.  No neuro compromise to suggest CNS emergency such as cauda equina.  Will give pain control and have her follow-up  with PCP and/or Ortho. Final Clinical Impression(s) / ED Diagnoses Final diagnoses:  Right hip pain    Rx / DC Orders ED Discharge Orders         Ordered    HYDROmorphone (DILAUDID) 2 MG tablet  Every 6 hours PRN        06/14/20 1216           Sherwood Gambler, MD 06/14/20 1519

## 2020-06-15 DIAGNOSIS — M5431 Sciatica, right side: Secondary | ICD-10-CM | POA: Diagnosis not present

## 2020-06-20 ENCOUNTER — Inpatient Hospital Stay: Payer: Medicare Other | Attending: Hematology

## 2020-06-20 ENCOUNTER — Other Ambulatory Visit: Payer: Self-pay

## 2020-06-20 ENCOUNTER — Inpatient Hospital Stay: Payer: Medicare Other

## 2020-06-20 VITALS — BP 165/73 | HR 76 | Temp 98.5°F | Resp 18

## 2020-06-20 DIAGNOSIS — D631 Anemia in chronic kidney disease: Secondary | ICD-10-CM | POA: Insufficient documentation

## 2020-06-20 DIAGNOSIS — D509 Iron deficiency anemia, unspecified: Secondary | ICD-10-CM

## 2020-06-20 DIAGNOSIS — E538 Deficiency of other specified B group vitamins: Secondary | ICD-10-CM | POA: Insufficient documentation

## 2020-06-20 DIAGNOSIS — N184 Chronic kidney disease, stage 4 (severe): Secondary | ICD-10-CM | POA: Insufficient documentation

## 2020-06-20 LAB — CBC WITH DIFFERENTIAL (CANCER CENTER ONLY)
Abs Immature Granulocytes: 0.09 10*3/uL — ABNORMAL HIGH (ref 0.00–0.07)
Basophils Absolute: 0 10*3/uL (ref 0.0–0.1)
Basophils Relative: 0 %
Eosinophils Absolute: 0.1 10*3/uL (ref 0.0–0.5)
Eosinophils Relative: 1 %
HCT: 29.9 % — ABNORMAL LOW (ref 36.0–46.0)
Hemoglobin: 9.5 g/dL — ABNORMAL LOW (ref 12.0–15.0)
Immature Granulocytes: 1 %
Lymphocytes Relative: 9 %
Lymphs Abs: 1.2 10*3/uL (ref 0.7–4.0)
MCH: 28.2 pg (ref 26.0–34.0)
MCHC: 31.8 g/dL (ref 30.0–36.0)
MCV: 88.7 fL (ref 80.0–100.0)
Monocytes Absolute: 0.8 10*3/uL (ref 0.1–1.0)
Monocytes Relative: 6 %
Neutro Abs: 11.2 10*3/uL — ABNORMAL HIGH (ref 1.7–7.7)
Neutrophils Relative %: 83 %
Platelet Count: 188 10*3/uL (ref 150–400)
RBC: 3.37 MIL/uL — ABNORMAL LOW (ref 3.87–5.11)
RDW: 16.6 % — ABNORMAL HIGH (ref 11.5–15.5)
WBC Count: 13.4 10*3/uL — ABNORMAL HIGH (ref 4.0–10.5)
nRBC: 0 % (ref 0.0–0.2)

## 2020-06-20 MED ORDER — EPOETIN ALFA-EPBX 40000 UNIT/ML IJ SOLN
INTRAMUSCULAR | Status: AC
  Filled 2020-06-20: qty 1

## 2020-06-20 MED ORDER — CYANOCOBALAMIN 1000 MCG/ML IJ SOLN
INTRAMUSCULAR | Status: AC
  Filled 2020-06-20: qty 1

## 2020-06-20 MED ORDER — EPOETIN ALFA-EPBX 40000 UNIT/ML IJ SOLN
40000.0000 [IU] | Freq: Once | INTRAMUSCULAR | Status: AC
  Administered 2020-06-20: 40000 [IU] via SUBCUTANEOUS

## 2020-06-20 MED ORDER — CYANOCOBALAMIN 1000 MCG/ML IJ SOLN
1000.0000 ug | Freq: Once | INTRAMUSCULAR | Status: AC
  Administered 2020-06-20: 1000 ug via INTRAMUSCULAR

## 2020-06-20 NOTE — Patient Instructions (Signed)
Epoetin Alfa injection What is this medicine? EPOETIN ALFA (e POE e tin AL fa) helps your body make more red blood cells. This medicine is used to treat anemia caused by chronic kidney disease, cancer chemotherapy, or HIV-therapy. It may also be used before surgery if you have anemia. This medicine may be used for other purposes; ask your health care provider or pharmacist if you have questions. COMMON BRAND NAME(S): Epogen, Procrit, Retacrit What should I tell my health care provider before I take this medicine? They need to know if you have any of these conditions:  cancer  heart disease  high blood pressure  history of blood clots  history of stroke  low levels of folate, iron, or vitamin B12 in the blood  seizures  an unusual or allergic reaction to erythropoietin, albumin, benzyl alcohol, hamster proteins, other medicines, foods, dyes, or preservatives  pregnant or trying to get pregnant  breast-feeding How should I use this medicine? This medicine is for injection into a vein or under the skin. It is usually given by a health care professional in a hospital or clinic setting. If you get this medicine at home, you will be taught how to prepare and give this medicine. Use exactly as directed. Take your medicine at regular intervals. Do not take your medicine more often than directed. It is important that you put your used needles and syringes in a special sharps container. Do not put them in a trash can. If you do not have a sharps container, call your pharmacist or healthcare provider to get one. A special MedGuide will be given to you by the pharmacist with each prescription and refill. Be sure to read this information carefully each time. Talk to your pediatrician regarding the use of this medicine in children. While this drug may be prescribed for selected conditions, precautions do apply. Overdosage: If you think you have taken too much of this medicine contact a poison  control center or emergency room at once. NOTE: This medicine is only for you. Do not share this medicine with others. What if I miss a dose? If you miss a dose, take it as soon as you can. If it is almost time for your next dose, take only that dose. Do not take double or extra doses. What may interact with this medicine? Interactions have not been studied. This list may not describe all possible interactions. Give your health care provider a list of all the medicines, herbs, non-prescription drugs, or dietary supplements you use. Also tell them if you smoke, drink alcohol, or use illegal drugs. Some items may interact with your medicine. What should I watch for while using this medicine? Your condition will be monitored carefully while you are receiving this medicine. You may need blood work done while you are taking this medicine. This medicine may cause a decrease in vitamin B6. You should make sure that you get enough vitamin B6 while you are taking this medicine. Discuss the foods you eat and the vitamins you take with your health care professional. What side effects may I notice from receiving this medicine? Side effects that you should report to your doctor or health care professional as soon as possible:  allergic reactions like skin rash, itching or hives, swelling of the face, lips, or tongue  seizures  signs and symptoms of a blood clot such as breathing problems; changes in vision; chest pain; severe, sudden headache; pain, swelling, warmth in the leg; trouble speaking; sudden numbness or  weakness of the face, arm or leg  signs and symptoms of a stroke like changes in vision; confusion; trouble speaking or understanding; severe headaches; sudden numbness or weakness of the face, arm or leg; trouble walking; dizziness; loss of balance or coordination Side effects that usually do not require medical attention (report to your doctor or health care professional if they continue or are  bothersome):  chills  cough  dizziness  fever  headaches  joint pain  muscle cramps  muscle pain  nausea, vomiting  pain, redness, or irritation at site where injected This list may not describe all possible side effects. Call your doctor for medical advice about side effects. You may report side effects to FDA at 1-800-FDA-1088. Where should I keep my medicine? Keep out of the reach of children. Store in a refrigerator between 2 and 8 degrees C (36 and 46 degrees F). Do not freeze or shake. Throw away any unused portion if using a single-dose vial. Multi-dose vials can be kept in the refrigerator for up to 21 days after the initial dose. Throw away unused medicine. NOTE: This sheet is a summary. It may not cover all possible information. If you have questions about this medicine, talk to your doctor, pharmacist, or health care provider.  2021 Elsevier/Gold Standard (2016-10-12 08:35:19)  Cyanocobalamin, Vitamin B12 injection What is this medicine? CYANOCOBALAMIN (sye an oh koe BAL a min) is a man made form of vitamin B12. Vitamin B12 is used in the growth of healthy blood cells, nerve cells, and proteins in the body. It also helps with the metabolism of fats and carbohydrates. This medicine is used to treat people who can not absorb vitamin B12. This medicine may be used for other purposes; ask your health care provider or pharmacist if you have questions. COMMON BRAND NAME(S): B-12 Compliance Kit, B-12 Injection Kit, Cyomin, LA-12, Nutri-Twelve, Physicians EZ Use B-12, Primabalt What should I tell my health care provider before I take this medicine? They need to know if you have any of these conditions:  kidney disease  Leber's disease  megaloblastic anemia  an unusual or allergic reaction to cyanocobalamin, cobalt, other medicines, foods, dyes, or preservatives  pregnant or trying to get pregnant  breast-feeding How should I use this medicine? This medicine is  injected into a muscle or deeply under the skin. It is usually given by a health care professional in a clinic or doctor's office. However, your doctor may teach you how to inject yourself. Follow all instructions. Talk to your pediatrician regarding the use of this medicine in children. Special care may be needed. Overdosage: If you think you have taken too much of this medicine contact a poison control center or emergency room at once. NOTE: This medicine is only for you. Do not share this medicine with others. What if I miss a dose? If you are given your dose at a clinic or doctor's office, call to reschedule your appointment. If you give your own injections and you miss a dose, take it as soon as you can. If it is almost time for your next dose, take only that dose. Do not take double or extra doses. What may interact with this medicine?  colchicine  heavy alcohol intake This list may not describe all possible interactions. Give your health care provider a list of all the medicines, herbs, non-prescription drugs, or dietary supplements you use. Also tell them if you smoke, drink alcohol, or use illegal drugs. Some items may interact with   your medicine. What should I watch for while using this medicine? Visit your doctor or health care professional regularly. You may need blood work done while you are taking this medicine. You may need to follow a special diet. Talk to your doctor. Limit your alcohol intake and avoid smoking to get the best benefit. What side effects may I notice from receiving this medicine? Side effects that you should report to your doctor or health care professional as soon as possible:  allergic reactions like skin rash, itching or hives, swelling of the face, lips, or tongue  blue tint to skin  chest tightness, pain  difficulty breathing, wheezing  dizziness  red, swollen painful area on the leg Side effects that usually do not require medical attention (report  to your doctor or health care professional if they continue or are bothersome):  diarrhea  headache This list may not describe all possible side effects. Call your doctor for medical advice about side effects. You may report side effects to FDA at 1-800-FDA-1088. Where should I keep my medicine? Keep out of the reach of children. Store at room temperature between 15 and 30 degrees C (59 and 85 degrees F). Protect from light. Throw away any unused medicine after the expiration date. NOTE: This sheet is a summary. It may not cover all possible information. If you have questions about this medicine, talk to your doctor, pharmacist, or health care provider.  2021 Elsevier/Gold Standard (2007-06-16 22:10:20)   

## 2020-06-21 ENCOUNTER — Encounter: Payer: Medicare Other | Attending: Physical Medicine & Rehabilitation | Admitting: Physical Medicine & Rehabilitation

## 2020-06-21 ENCOUNTER — Encounter: Payer: Self-pay | Admitting: Physical Medicine & Rehabilitation

## 2020-06-21 VITALS — BP 120/70 | HR 65 | Temp 98.8°F | Ht 64.0 in | Wt 181.0 lb

## 2020-06-21 DIAGNOSIS — M48062 Spinal stenosis, lumbar region with neurogenic claudication: Secondary | ICD-10-CM | POA: Diagnosis not present

## 2020-06-21 LAB — FERRITIN: Ferritin: 321 ng/mL — ABNORMAL HIGH (ref 11–307)

## 2020-06-21 MED ORDER — PREGABALIN 100 MG PO CAPS
100.0000 mg | ORAL_CAPSULE | Freq: Two times a day (BID) | ORAL | 1 refills | Status: DC
Start: 2020-06-21 — End: 2020-07-01

## 2020-06-21 NOTE — Patient Instructions (Signed)
Ask Dr from [facility] to send copy of MRI and MD notes

## 2020-06-21 NOTE — Progress Notes (Signed)
Subjective:    Patient ID: [name], female    DOB: 1945-05-23, 75 y.o.   MRN: [id number]  HPI 75 year old female with history of multilevel lumbar spinal stenosis with history of lumbar discectomy approximately 15 years ago who has had primarily right lower extremity radiculitis.  She has been treated in this clinic and has undergone in the past epidural steroid injections at the L2-3 level, L4-5 level L5-S1 level as well as S1 transforaminal.  She has been maintained on gabapentin but was switched to pregabalin at 100 mg twice daily.  The patient was last seen in February 2021 but then was lost to follow-up.  The patient states that she was doing well and did not feel like she needed to come back.  She has been off her pregabalin for quite some time. The patient states she has had a fall she has been to the emergency department and also to be [facility].  X-rays of the hip and lumbar spine were unremarkable.  She was given hydromorphone in the ED which was of limited benefit. The patient has had no progressive weakness no new bowel or bladder dysfunction.  The patient has pain with walking bending sitting and standing.  Sleep is poor because of this.  Her pain is described as aching mainly on the right lower extremity.  She does not have any joint swelling.    CLINICAL DATA:  75 y/o F; right lower back pain radiating down the right lower extremity for 3 months. History of lumbar surgery 12 years ago. History of breast cancer post radiation.  EXAM: MRI LUMBAR SPINE WITHOUT CONTRAST  TECHNIQUE: Multiplanar, multisequence MR imaging of the lumbar spine was performed. No intravenous contrast was administered.  COMPARISON:  10/24/2017 CT chest CT chest. 09/17/2017 CT abdomen and pelvis. 09/10/2017 lumbar spine radiographs. 05/29/2014 lumbar spine MRI.  FINDINGS: Segmentation: 12 rib-bearing vertebral bodies and 6 non-rib-bearing vertebral bodies are present.  For convention and consistency with priors the lowest complete vertebral body will be labeled as L5.  Alignment: Mild lumbar levocurvature with apex at L3. L4-5 stable grade 1 anterolisthesis.  Vertebrae: No fracture, evidence of discitis, or focal bone lesion. Marrow signal is diffusely decreased on the T1 weighted sequence.  Conus medullaris and cauda equina: Conus extends to the T12-L1 level. Conus and cauda equina appear normal.  Paraspinal and other soft tissues: Postsurgical changes and paraspinal muscles related to a right L4-5 hemilaminectomy, no fluid collection. Multiple T2 hyperintense structures within the kidneys similar prior lumbar spine MRI are compatible with cysts.  Disc levels:  L1-2: Stable mild disc bulge eccentric to the left combined with mild facet and ligamentum flavum hypertrophy. No significant foraminal or canal stenosis. Mild left lateral recess narrowing.  L2-3: Progression of moderate disc bulge, facet hypertrophy, and ligamentum flavum hypertrophy. Mild bilateral foraminal stenosis. Moderate to severe canal stenosis.  L3-4: Stable disc bulge eccentric to the right with moderate facet and ligamentum flavum hypertrophy. Mild right-greater-than-left foraminal stenosis. No significant canal stenosis.  L4-5: Stable grade 1 anterolisthesis with mild progression of disc bulge, facet hypertrophy, and ligamentum flavum hypertrophy. Post right hemilaminectomy. Mild bilateral foraminal and canal stenosis with partial effacement of lateral recesses.  L5-S1: Stable central disc protrusion. Central annular fissure. Moderate facet hypertrophy. Trace facet effusions, likely degenerative. Mild bilateral foraminal and canal stenosis. The protrusion effaces the lateral recesses with disc contact on the descending S1 nerve roots bilaterally.  IMPRESSION: 1. No acute osseous abnormality. Stable L4-5 grade  1 anterolisthesis with prominent facet  arthropathy. 2. Mild progression of lumbar spine degenerative changes at the L2-3 level. Overall stable degenerative changes at additional lumbar levels. 3. Moderate to severe L2-3, mild L4-5, and mild L5-S1 canal stenosis. 4. L5-S1 central protrusion effaces lateral recesses with contact on descending S1 nerve roots. 5. Multilevel mild neural foraminal stenosis. No high-grade foraminal stenosis. 6. Stable diffusely decreased bone marrow signal is probably related to history of secondary renal hyperparathyroidism and bony sclerosis.   Electronically Signed   By: Kristine Garbe M.D.   On: 12/21/2017 22:33  Pain Inventory Average Pain 10 Pain Right Now 10 My pain is aching  In the last 24 hours, has pain interfered with the following? General activity 10 Relation with others 7 Enjoyment of life 10 What TIME of day is your pain at its worst? morning , daytime, evening and night Sleep (in general) Poor  Pain is worse with: walking, bending, sitting and standing Pain improves with: medication Relief from Meds: no pain meds  Family History  Problem Relation Age of Onset  . Pancreatic cancer Brother 59  . Breast cancer Mother 54  . Lung cancer Maternal Aunt   . Breast cancer Maternal Aunt   . Prostate cancer Maternal Uncle   . Heart attack Maternal Grandmother   . Breast cancer Maternal Grandmother   . Colon cancer Maternal Grandmother   . Clotting disorder Maternal Grandmother   . Diabetes Maternal Grandmother   . Kidney failure Maternal Aunt   . Hypertension Daughter    Social History   Socioeconomic History  . Marital status: Legally Separated    Spouse name: Not on file  . Number of children: 5  . Years of education: Not on file  . Highest education level: Not on file  Occupational History  . Occupation: retired    Fish farm manager: [employer]  Tobacco Use  . Smoking status: Never Smoker  . Smokeless tobacco: Never Used  Vaping Use  .  Vaping Use: Never used  Substance and Sexual Activity  . Alcohol use: No    Alcohol/week: 0.0 standard drinks  . Drug use: No  . Sexual activity: Not on file  Other Topics Concern  . Not on file  Social History Narrative   She is separated 2 sons 3 daughters   She has grandchildren and great-grandchildren   She is retired   Never smoker no tobacco now no drugs no alcohol no caffeine   Social Determinants of Radio broadcast assistant Strain: Not on file  Food Insecurity: Not on file  Transportation Needs: Not on file  Physical Activity: Not on file  Stress: Not on file  Social Connections: Not on file   Past Surgical History:  Procedure Laterality Date  . ABDOMINAL HYSTERECTOMY    . BILATERAL SALPINGOOPHORECTOMY  8/03   Lap BSO (R ovarian fibroma) and adhesion lysis  . BOWEL RESECTION  2003   ileocolectomy with anastomosis 2/2 adhesions  . BREAST BIOPSY Right 2015  . BREAST BIOPSY Right 07/2014  . BREAST LUMPECTOMY Right 04/22/2013  . BREAST LUMPECTOMY WITH NEEDLE LOCALIZATION AND AXILLARY SENTINEL LYMPH NODE BX Right 04/22/2013   Procedure: BREAST LUMPECTOMY WITH NEEDLE LOCALIZATION AND AXILLARY SENTINEL LYMPH NODE BX;  Surgeon: Stark Klein, MD;  Location: [facility];  Service: General;  Laterality: Right;  . CARDIAC CATHETERIZATION     2 stents  . CHOLECYSTECTOMY    . COLONOSCOPY    . HEMICOLECTOMY  R sided hemicolectomy  . HERNIA REPAIR     Ventral hernia repair  . PTCA  4/06   Past Surgical History:  Procedure Laterality Date  . ABDOMINAL HYSTERECTOMY    . BILATERAL SALPINGOOPHORECTOMY  8/03   Lap BSO (R ovarian fibroma) and adhesion lysis  . BOWEL RESECTION  2003   ileocolectomy with anastomosis 2/2 adhesions  . BREAST BIOPSY Right 2015  . BREAST BIOPSY Right 07/2014  . BREAST LUMPECTOMY Right 04/22/2013  . BREAST LUMPECTOMY WITH NEEDLE LOCALIZATION AND AXILLARY SENTINEL LYMPH NODE BX Right 04/22/2013   Procedure: BREAST LUMPECTOMY WITH  NEEDLE LOCALIZATION AND AXILLARY SENTINEL LYMPH NODE BX;  Surgeon: Stark Klein, MD;  Location: [facility];  Service: General;  Laterality: Right;  . CARDIAC CATHETERIZATION     2 stents  . CHOLECYSTECTOMY    . COLONOSCOPY    . HEMICOLECTOMY     R sided hemicolectomy  . HERNIA REPAIR     Ventral hernia repair  . PTCA  4/06   Past Medical History:  Diagnosis Date  . Abdominal discomfort    Chronic N/V/D. Presumptive dx Crohn's dx per elevated p ANCA. Failed Entocort and Pentasa. Sep 2003 - ileocolectomy c anastomosis per Dr Deon Pilling 2/2 adhesions - path was hegative for Crohns. EGD, Sm bowel follow through (11/03), and an eteroclysis (10/03) were unrevealing. Cuases hypomag and hypocalcemia.  . Adnexal mass 8/03   s/p lap BSO (R ovarian fibroma) & lysis of adhesions  . Allergy    Seasonal  . Anemia    Multifactorial. Baseline HgB 10-11 ish. B12 def - 150 in 3/10. Fe Def - ferritin 35 3/10. Both are being repleted.  . Breast cancer ([place]) 03/16/13   right, 5 o'clock  . CAD (coronary artery disease) 1996   1996 - PTCA and angioplasty diagonal branch. 2000 - Rotoblator & angiopllasty of diagonal. 2006 - subendocardial AMI, DES to proximal LAD.Marland Kitchen Also had 90% stenosis in distal apical LAD. EF 55 with apical hypokinesis. Indefinite ASA and Plavix.  . CHF (congestive heart failure) ([place])   . Chronic kidney disease    Chronic renal insuff baseline Cr 1.2 - 1.4 ish.  . Chronic pain    CT 10/10 = Spinal stenosis L2 - S1.  . Diabetes mellitus    Insulin dependent  . Gout   . Hx of radiation therapy 06/02/13- 07/16/13   right rbeast 4500 cGy 25 sessions, right breast boost 1600 cGy in 8 sessions  . Hyperlipidemia    Managed with both a statin and Welchol. Welchol stopped 2014 2/2 cost and started on fenofibrate   . Hypertension    2006 B renal arteries patent. 2003 MRA - no RAS. 2003 pheo W/U Dr Hassell Done reportedly negative.  . Hypoxia 07/23/2017  . Lupus ([place])   . Lymphedema of  breast   . Personal history of radiation therapy 2015  . RBBB   . Renal vein thrombosis ([place])   . SBO (small bowel obstruction) ([place]) 09/17/2017  . Secondary hyperparathyroidism ([place])   . Stroke [facility])    Incidental finding MRI 2002 L lacunar infarct  . Vitamin B12 deficiency   . Vitamin D deficiency   . Wears dentures    top   BP 120/70   Pulse 65   Temp 98.8 F (37.1 C)   Ht 5' 4"  (1.626 m)   Wt 181 lb (82.1 kg)   SpO2 98%   BMI 31.07 kg/m   Opioid Risk Score:   Fall Risk Score:  `  1  Depression screen PHQ 2/9  No flowsheet data found.  Review of Systems  Constitutional: Negative.   HENT: Negative.   Eyes: Negative.   Respiratory: Negative.   Cardiovascular: Negative.   Gastrointestinal: Negative.   Genitourinary: Negative.   Musculoskeletal: Positive for gait problem.  Skin: Negative.   Allergic/Immunologic: Negative.   Hematological: Negative.   Psychiatric/Behavioral: Negative.   All other systems reviewed and are negative.      Objective:   Physical Exam Vitals and nursing note reviewed.  Constitutional:      General: She is not in acute distress.    Appearance: She is obese.  HENT:     Head: Normocephalic and atraumatic.  Eyes:     Extraocular Movements: Extraocular movements intact.     Conjunctiva/sclera: Conjunctivae normal.     Pupils: Pupils are equal, round, and reactive to light.  Musculoskeletal:     Comments: No pain with hip knee or ankle range of motion there is mild tenderness lumbar paraspinals L3-4-5 S1 area right greater than left side. Patient ambulates with a rolling walker no evidence of toe drag or knee instability. No pain over the lateral hip area No evidence of effusion in the knee ankle or foot area  Skin:    General: Skin is warm and dry.  Neurological:     Mental Status: She is alert and oriented to person, place, and time.     Cranial Nerves: No dysarthria or facial asymmetry.     Coordination: Coordination is intact.      Gait: Gait abnormal.     Deep Tendon Reflexes:     Reflex Scores:      Patellar reflexes are 2+ on the right side and 2+ on the left side.      Achilles reflexes are 0 on the right side and 0 on the left side.    Comments: Ambulates with walker  Motor strength is 5/5 bilateral hip flexor knee extensor ankle dorsiflexor. Tone is normal  Psychiatric:        Mood and Affect: Mood normal.        Behavior: Behavior normal.           Assessment & Plan:  #1.  Acute exacerbation of chronic right lower extremity pain.  Prior history of lumbar stenosis multilevel.  The patient was previously maintained on a combination of pregabalin as well as intermittent lumbar epidural injections.  She has another MRI scheduled which will be done in the truck besides [facility].  I have asked her to get a copy of the report for me.  I do not see any sign of cauda equina syndrome or any emergent or urgent situation here.  We will resume pregabalin 100 mg twice daily we discussed that this may take about a week to get in her system.  Further adjustments may need to be made.  We will review repeat MRI prior to making any further recommendations in terms of epidural steroid injections.

## 2020-06-22 DIAGNOSIS — M545 Low back pain, unspecified: Secondary | ICD-10-CM | POA: Diagnosis not present

## 2020-06-28 ENCOUNTER — Other Ambulatory Visit: Payer: Self-pay

## 2020-06-28 ENCOUNTER — Emergency Department (HOSPITAL_BASED_OUTPATIENT_CLINIC_OR_DEPARTMENT_OTHER): Payer: Medicare Other

## 2020-06-28 ENCOUNTER — Encounter (HOSPITAL_BASED_OUTPATIENT_CLINIC_OR_DEPARTMENT_OTHER): Payer: Self-pay

## 2020-06-28 ENCOUNTER — Inpatient Hospital Stay (HOSPITAL_BASED_OUTPATIENT_CLINIC_OR_DEPARTMENT_OTHER)
Admission: EM | Admit: 2020-06-28 | Discharge: 2020-07-01 | DRG: 300 | Disposition: A | Discharge status: Home / Self Care | Payer: Medicare Other | Attending: Student | Admitting: Student

## 2020-06-28 DIAGNOSIS — E1169 Type 2 diabetes mellitus with other specified complication: Secondary | ICD-10-CM | POA: Diagnosis not present

## 2020-06-28 DIAGNOSIS — Z794 Long term (current) use of insulin: Secondary | ICD-10-CM

## 2020-06-28 DIAGNOSIS — W19XXXA Unspecified fall, initial encounter: Secondary | ICD-10-CM | POA: Diagnosis not present

## 2020-06-28 DIAGNOSIS — Z90710 Acquired absence of both cervix and uterus: Secondary | ICD-10-CM | POA: Diagnosis not present

## 2020-06-28 DIAGNOSIS — Z803 Family history of malignant neoplasm of breast: Secondary | ICD-10-CM

## 2020-06-28 DIAGNOSIS — Z8249 Family history of ischemic heart disease and other diseases of the circulatory system: Secondary | ICD-10-CM | POA: Diagnosis not present

## 2020-06-28 DIAGNOSIS — E1122 Type 2 diabetes mellitus with diabetic chronic kidney disease: Secondary | ICD-10-CM | POA: Diagnosis present

## 2020-06-28 DIAGNOSIS — I82451 Acute embolism and thrombosis of right peroneal vein: Secondary | ICD-10-CM | POA: Diagnosis not present

## 2020-06-28 DIAGNOSIS — R0601 Orthopnea: Secondary | ICD-10-CM | POA: Diagnosis not present

## 2020-06-28 DIAGNOSIS — Z91048 Other nonmedicinal substance allergy status: Secondary | ICD-10-CM

## 2020-06-28 DIAGNOSIS — W19XXXD Unspecified fall, subsequent encounter: Secondary | ICD-10-CM | POA: Diagnosis not present

## 2020-06-28 DIAGNOSIS — Z853 Personal history of malignant neoplasm of breast: Secondary | ICD-10-CM | POA: Diagnosis not present

## 2020-06-28 DIAGNOSIS — M5417 Radiculopathy, lumbosacral region: Secondary | ICD-10-CM | POA: Diagnosis not present

## 2020-06-28 DIAGNOSIS — E1159 Type 2 diabetes mellitus with other circulatory complications: Secondary | ICD-10-CM | POA: Diagnosis not present

## 2020-06-28 DIAGNOSIS — I2510 Atherosclerotic heart disease of native coronary artery without angina pectoris: Secondary | ICD-10-CM | POA: Diagnosis present

## 2020-06-28 DIAGNOSIS — Z833 Family history of diabetes mellitus: Secondary | ICD-10-CM

## 2020-06-28 DIAGNOSIS — D649 Anemia, unspecified: Secondary | ICD-10-CM | POA: Diagnosis not present

## 2020-06-28 DIAGNOSIS — Z20822 Contact with and (suspected) exposure to covid-19: Secondary | ICD-10-CM | POA: Diagnosis not present

## 2020-06-28 DIAGNOSIS — I517 Cardiomegaly: Secondary | ICD-10-CM | POA: Diagnosis not present

## 2020-06-28 DIAGNOSIS — M109 Gout, unspecified: Secondary | ICD-10-CM | POA: Diagnosis present

## 2020-06-28 DIAGNOSIS — Z992 Dependence on renal dialysis: Secondary | ICD-10-CM

## 2020-06-28 DIAGNOSIS — R0600 Dyspnea, unspecified: Secondary | ICD-10-CM

## 2020-06-28 DIAGNOSIS — Z9049 Acquired absence of other specified parts of digestive tract: Secondary | ICD-10-CM | POA: Diagnosis not present

## 2020-06-28 DIAGNOSIS — E11649 Type 2 diabetes mellitus with hypoglycemia without coma: Secondary | ICD-10-CM | POA: Diagnosis not present

## 2020-06-28 DIAGNOSIS — D631 Anemia in chronic kidney disease: Secondary | ICD-10-CM | POA: Diagnosis not present

## 2020-06-28 DIAGNOSIS — R0602 Shortness of breath: Secondary | ICD-10-CM | POA: Diagnosis not present

## 2020-06-28 DIAGNOSIS — E1142 Type 2 diabetes mellitus with diabetic polyneuropathy: Secondary | ICD-10-CM

## 2020-06-28 DIAGNOSIS — I130 Hypertensive heart and chronic kidney disease with heart failure and stage 1 through stage 4 chronic kidney disease, or unspecified chronic kidney disease: Secondary | ICD-10-CM | POA: Diagnosis not present

## 2020-06-28 DIAGNOSIS — E875 Hyperkalemia: Secondary | ICD-10-CM | POA: Diagnosis present

## 2020-06-28 DIAGNOSIS — Z923 Personal history of irradiation: Secondary | ICD-10-CM | POA: Diagnosis not present

## 2020-06-28 DIAGNOSIS — I272 Pulmonary hypertension, unspecified: Secondary | ICD-10-CM | POA: Diagnosis present

## 2020-06-28 DIAGNOSIS — N189 Chronic kidney disease, unspecified: Secondary | ICD-10-CM | POA: Diagnosis not present

## 2020-06-28 DIAGNOSIS — D509 Iron deficiency anemia, unspecified: Secondary | ICD-10-CM | POA: Diagnosis not present

## 2020-06-28 DIAGNOSIS — R600 Localized edema: Secondary | ICD-10-CM | POA: Diagnosis not present

## 2020-06-28 DIAGNOSIS — I5032 Chronic diastolic (congestive) heart failure: Secondary | ICD-10-CM | POA: Diagnosis present

## 2020-06-28 DIAGNOSIS — Z885 Allergy status to narcotic agent status: Secondary | ICD-10-CM

## 2020-06-28 DIAGNOSIS — N184 Chronic kidney disease, stage 4 (severe): Secondary | ICD-10-CM | POA: Diagnosis not present

## 2020-06-28 DIAGNOSIS — R7989 Other specified abnormal findings of blood chemistry: Secondary | ICD-10-CM | POA: Diagnosis not present

## 2020-06-28 DIAGNOSIS — Z8739 Personal history of other diseases of the musculoskeletal system and connective tissue: Secondary | ICD-10-CM | POA: Diagnosis not present

## 2020-06-28 DIAGNOSIS — E7800 Pure hypercholesterolemia, unspecified: Secondary | ICD-10-CM | POA: Diagnosis not present

## 2020-06-28 DIAGNOSIS — R0609 Other forms of dyspnea: Secondary | ICD-10-CM | POA: Diagnosis present

## 2020-06-28 DIAGNOSIS — I10 Essential (primary) hypertension: Secondary | ICD-10-CM | POA: Diagnosis not present

## 2020-06-28 DIAGNOSIS — N179 Acute kidney failure, unspecified: Secondary | ICD-10-CM | POA: Diagnosis present

## 2020-06-28 DIAGNOSIS — M549 Dorsalgia, unspecified: Secondary | ICD-10-CM | POA: Diagnosis not present

## 2020-06-28 DIAGNOSIS — Z8673 Personal history of transient ischemic attack (TIA), and cerebral infarction without residual deficits: Secondary | ICD-10-CM

## 2020-06-28 DIAGNOSIS — Z888 Allergy status to other drugs, medicaments and biological substances status: Secondary | ICD-10-CM

## 2020-06-28 DIAGNOSIS — Z9189 Other specified personal risk factors, not elsewhere classified: Secondary | ICD-10-CM | POA: Diagnosis not present

## 2020-06-28 DIAGNOSIS — Z79899 Other long term (current) drug therapy: Secondary | ICD-10-CM

## 2020-06-28 LAB — BASIC METABOLIC PANEL
Anion gap: 9 (ref 5–15)
BUN: 84 mg/dL — ABNORMAL HIGH (ref 8–23)
CO2: 20 mmol/L — ABNORMAL LOW (ref 22–32)
Calcium: 8.7 mg/dL — ABNORMAL LOW (ref 8.9–10.3)
Chloride: 109 mmol/L (ref 98–111)
Creatinine, Ser: 2.65 mg/dL — ABNORMAL HIGH (ref 0.44–1.00)
GFR, Estimated: 19 mL/min — ABNORMAL LOW (ref >60)
Glucose, Bld: 120 mg/dL — ABNORMAL HIGH (ref 70–99)
Potassium: 5.5 mmol/L — ABNORMAL HIGH (ref 3.5–5.1)
Sodium: 138 mmol/L (ref 135–145)

## 2020-06-28 LAB — CBC WITH DIFFERENTIAL/PLATELET
Abs Immature Granulocytes: 0.23 10*3/uL — ABNORMAL HIGH (ref 0.00–0.07)
Basophils Absolute: 0 10*3/uL (ref 0.0–0.1)
Basophils Relative: 0 %
Eosinophils Absolute: 0.1 10*3/uL (ref 0.0–0.5)
Eosinophils Relative: 1 %
HCT: 27.2 % — ABNORMAL LOW (ref 36.0–46.0)
Hemoglobin: 8.5 g/dL — ABNORMAL LOW (ref 12.0–15.0)
Immature Granulocytes: 2 %
Lymphocytes Relative: 12 %
Lymphs Abs: 1.2 10*3/uL (ref 0.7–4.0)
MCH: 28.1 pg (ref 26.0–34.0)
MCHC: 31.3 g/dL (ref 30.0–36.0)
MCV: 90.1 fL (ref 80.0–100.0)
Monocytes Absolute: 0.8 10*3/uL (ref 0.1–1.0)
Monocytes Relative: 8 %
Neutro Abs: 8.1 10*3/uL — ABNORMAL HIGH (ref 1.7–7.7)
Neutrophils Relative %: 77 %
Platelets: 164 10*3/uL (ref 150–400)
RBC: 3.02 MIL/uL — ABNORMAL LOW (ref 3.87–5.11)
RDW: 17.2 % — ABNORMAL HIGH (ref 11.5–15.5)
WBC: 10.5 10*3/uL (ref 4.0–10.5)
nRBC: 0.6 % — ABNORMAL HIGH (ref 0.0–0.2)

## 2020-06-28 LAB — D-DIMER, QUANTITATIVE: D-Dimer, Quant: 4.98 ug/mL-FEU — ABNORMAL HIGH (ref 0.00–0.50)

## 2020-06-28 LAB — TROPONIN I (HIGH SENSITIVITY): Troponin I (High Sensitivity): 12 ng/L (ref <18)

## 2020-06-28 LAB — BRAIN NATRIURETIC PEPTIDE: B Natriuretic Peptide: 59.9 pg/mL (ref 0.0–100.0)

## 2020-06-28 MED ORDER — HEPARIN (PORCINE) 25000 UT/250ML-% IV SOLN
1200.0000 [IU]/h | INTRAVENOUS | Status: DC
  Administered 2020-06-29: 1100 [IU]/h via INTRAVENOUS
  Administered 2020-06-29: 1300 [IU]/h via INTRAVENOUS
  Filled 2020-06-28 (×2): qty 250

## 2020-06-28 MED ORDER — SODIUM ZIRCONIUM CYCLOSILICATE 10 G PO PACK
10.0000 g | PACK | Freq: Once | ORAL | Status: AC
  Administered 2020-06-29: 10 g via ORAL
  Filled 2020-06-28: qty 1

## 2020-06-28 MED ORDER — HEPARIN BOLUS VIA INFUSION
4000.0000 [IU] | Freq: Once | INTRAVENOUS | Status: AC
  Administered 2020-06-29: 4000 [IU] via INTRAVENOUS

## 2020-06-28 NOTE — Progress Notes (Signed)
ANTICOAGULATION CONSULT NOTE - Initial Consult  Pharmacy Consult for Heparin Indication: R/O  pulmonary embolus  Allergies  Allergen Reactions  . Percocet [Oxycodone-Acetaminophen] Other (See Comments)    headaches  . Diazepam Other (See Comments)    REACTION: Agitation  . Haloperidol Lactate Nausea And Vomiting  . Lorazepam Nausea Only  . Morphine Sulfate Other (See Comments)    REACTION: Agitation  . Propoxyphene Hcl Nausea And Vomiting  . Tramadol Hcl Nausea And Vomiting  . Anesthetics, Halogenated     Per patient's daughter, she can not tolerate anesthetics.   . Etomidate     Heart stopped during anesthesia  . Fentanyl     Heart stopped during anesthesia   . Versed [Midazolam]     Heart stopped during anesthesia    Patient Measurements: Height: 5' 5"  (165.1 cm) Weight: 83 kg (183 lb) IBW/kg (Calculated) : 57 Heparin Dosing Weight: 75 kg  Vital Signs: Temp: 98.1 F (36.7 C) (04/12 1926) Temp Source: Oral (04/12 1926) BP: 155/65 (04/12 2200) Pulse Rate: 73 (04/12 2200)  Labs: Recent Labs    06/28/20 2056  HGB 8.5*  HCT 27.2*  PLT 164  CREATININE 2.65*  TROPONINIHS 12    Estimated Creatinine Clearance: 20.4 mL/min (A) (by C-G formula based on SCr of 2.65 mg/dL (H)).   Medical History: Past Medical History:  Diagnosis Date  . Abdominal discomfort    Chronic N/V/D. Presumptive dx Crohn's dx per elevated p ANCA. Failed Entocort and Pentasa. Sep 2003 - ileocolectomy c anastomosis per Dr Deon Pilling 2/2 adhesions - path was hegative for Crohns. EGD, Sm bowel follow through (11/03), and an eteroclysis (10/03) were unrevealing. Cuases hypomag and hypocalcemia.  . Adnexal mass 8/03   s/p lap BSO (R ovarian fibroma) & lysis of adhesions  . Allergy    Seasonal  . Anemia    Multifactorial. Baseline HgB 10-11 ish. B12 def - 150 in 3/10. Fe Def - ferritin 35 3/10. Both are being repleted.  . Breast cancer ([place]) 03/16/13   right, 5 o'clock  . CAD (coronary artery  disease) 1996   1996 - PTCA and angioplasty diagonal branch. 2000 - Rotoblator & angiopllasty of diagonal. 2006 - subendocardial AMI, DES to proximal LAD.Marland Kitchen Also had 90% stenosis in distal apical LAD. EF 55 with apical hypokinesis. Indefinite ASA and Plavix.  . CHF (congestive heart failure) ([place])   . Chronic kidney disease    Chronic renal insuff baseline Cr 1.2 - 1.4 ish.  . Chronic pain    CT 10/10 = Spinal stenosis L2 - S1.  . Diabetes mellitus    Insulin dependent  . Gout   . Hx of radiation therapy 06/02/13- 07/16/13   right rbeast 4500 cGy 25 sessions, right breast boost 1600 cGy in 8 sessions  . Hyperlipidemia    Managed with both a statin and Welchol. Welchol stopped 2014 2/2 cost and started on fenofibrate   . Hypertension    2006 B renal arteries patent. 2003 MRA - no RAS. 2003 pheo W/U Dr Hassell Done reportedly negative.  . Hypoxia 07/23/2017  . Lupus ([place])   . Lymphedema of breast   . Personal history of radiation therapy 2015  . RBBB   . Renal vein thrombosis ([place])   . SBO (small bowel obstruction) ([place]) 09/17/2017  . Secondary hyperparathyroidism ([place])   . Stroke [facility])    Incidental finding MRI 2002 L lacunar infarct  . Vitamin B12 deficiency   . Vitamin D deficiency   . Wears dentures  top    Medications:  No current facility-administered medications on file prior to encounter.   Current Outpatient Medications on File Prior to Encounter  Medication Sig Dispense Refill  . acetaminophen (TYLENOL) 325 MG tablet Take 650 mg by mouth every 6 (six) hours as needed for mild pain.    Marland Kitchen amLODipine (NORVASC) 10 MG tablet Take 1 tablet (10 mg total) by mouth daily. 30 tablet 0  . atorvastatin (LIPITOR) 40 MG tablet Take 40 mg by mouth daily.    . budesonide (ENTOCORT EC) 3 MG 24 hr capsule Take 1 capsule by mouth once daily 30 capsule 3  . carvedilol (COREG) 25 MG tablet Take 25 mg by mouth 2 (two) times daily.    . colestipol (COLESTID) 1 g tablet Take 2 g by mouth at bedtime.     Marland Kitchen desloratadine (CLARINEX) 5 MG tablet Take 5 mg by mouth at bedtime as needed (allergies). Reported on 04/11/2015    . ferrous fumarate (HEMOCYTE - 106 MG FE) 325 (106 Fe) MG TABS tablet Take 1 tablet by mouth as directed.    . hydrALAZINE (APRESOLINE) 100 MG tablet TAKE 1 TABLET BY MOUTH 3  TIMES DAILY 270 tablet 2  . HYDROmorphone (DILAUDID) 2 MG tablet Take 1 tablet (2 mg total) by mouth every 6 (six) hours as needed for severe pain. (Patient not taking: Reported on 06/21/2020) 10 tablet 0  . hydrOXYzine (ATARAX/VISTARIL) 50 MG tablet Take 50 mg by mouth 2 (two) times daily as needed.    . isosorbide mononitrate (IMDUR) 30 MG 24 hr tablet Take 1/2 (one-half) tablet by mouth once daily 45 tablet 6  . LANTUS SOLOSTAR 100 UNIT/ML Solostar Pen SMARTSIG:14 Unit(s) SUB-Q Every Night    . linagliptin (TRADJENTA) 5 MG TABS tablet Take 5 mg by mouth daily.    . Melatonin 5 MG TABS Take 10 mg by mouth at bedtime.    . nitroGLYCERIN (NITROSTAT) 0.4 MG SL tablet DISSOLVE 1 TABLET UNDER THE TONGUE EVERY 5 MINUTES AS  NEEDED FOR CHEST PAIN. MAX  OF 3 TABLETS IN 15 MINUTES. CALL 911 IF PAIN PERSISTS. 100 tablet 3  . pantoprazole (PROTONIX) 40 MG tablet Take 1 tablet (40 mg total) by mouth daily before breakfast. 90 tablet 3  . pregabalin (LYRICA) 100 MG capsule Take 1 capsule (100 mg total) by mouth 2 (two) times daily. 60 capsule 1  . torsemide (DEMADEX) 20 MG tablet Take 20 mg by mouth daily. 20 mg every other day, 40 mg days not taking 20 mg    . ULORIC 40 MG tablet Take 1 tablet by mouth daily.    . Vitamin D, Ergocalciferol, (DRISDOL) 1.25 MG (50000 UNIT) CAPS capsule Take 1 capsule (50,000 Units total) by mouth every 7 (seven) days. 8 capsule 0     Assessment: 75 y.o. female with SOB, elevated D-dimer, possible PE, for heparin Goal of Therapy:  Heparin level 0.3-0.7 units/ml Monitor platelets by anticoagulation protocol: Yes   Plan:  Heparin 4000 units IV bolus, then start heparin 1300  units/hr Check heparin level in 8 hours.   Caryl Pina 06/28/2020,11:43 PM

## 2020-06-28 NOTE — ED Provider Notes (Signed)
[facility] EMERGENCY DEPT Provider Note   CSN: [id number] Arrival date & time: 06/28/20  1857     History Chief Complaint  Patient presents with  . Shortness of Breath    [name] is a 75 y.o. female.  She was here with a complaint of shortness of breath.  She said this began this morning worse with activity.  She denies any prior pulmonary problems.  She is also complaining of a little bit of left-sided chest pain that began on her way over here to the hospital.  No cough no fever no abdominal pain.  She has chronic edema of her lower extremities.  She denies smoking.  She is COVID vaccinated  The history is provided by the patient.  Shortness of Breath Severity:  Moderate Onset quality:  Gradual Duration:  12 hours Timing:  Intermittent Progression:  Unchanged Chronicity:  New Context: activity   Relieved by:  Nothing Worsened by:  Activity Ineffective treatments:  None tried Associated symptoms: chest pain   Associated symptoms: no abdominal pain, no cough, no fever, no headaches, no hemoptysis, no neck pain, no rash, no sore throat, no sputum production, no vomiting and no wheezing   Risk factors: no tobacco use        Past Medical History:  Diagnosis Date  . Abdominal discomfort    Chronic N/V/D. Presumptive dx Crohn's dx per elevated p ANCA. Failed Entocort and Pentasa. Sep 2003 - ileocolectomy c anastomosis per Dr Deon Pilling 2/2 adhesions - path was hegative for Crohns. EGD, Sm bowel follow through (11/03), and an eteroclysis (10/03) were unrevealing. Cuases hypomag and hypocalcemia.  . Adnexal mass 8/03   s/p lap BSO (R ovarian fibroma) & lysis of adhesions  . Allergy    Seasonal  . Anemia    Multifactorial. Baseline HgB 10-11 ish. B12 def - 150 in 3/10. Fe Def - ferritin 35 3/10. Both are being repleted.  . Breast cancer ([place]) 03/16/13   right, 5 o'clock  . CAD (coronary artery disease) 1996   1996 - PTCA and angioplasty diagonal branch.  2000 - Rotoblator & angiopllasty of diagonal. 2006 - subendocardial AMI, DES to proximal LAD.Marland Kitchen Also had 90% stenosis in distal apical LAD. EF 55 with apical hypokinesis. Indefinite ASA and Plavix.  . CHF (congestive heart failure) ([place])   . Chronic kidney disease    Chronic renal insuff baseline Cr 1.2 - 1.4 ish.  . Chronic pain    CT 10/10 = Spinal stenosis L2 - S1.  . Diabetes mellitus    Insulin dependent  . Gout   . Hx of radiation therapy 06/02/13- 07/16/13   right rbeast 4500 cGy 25 sessions, right breast boost 1600 cGy in 8 sessions  . Hyperlipidemia    Managed with both a statin and Welchol. Welchol stopped 2014 2/2 cost and started on fenofibrate   . Hypertension    2006 B renal arteries patent. 2003 MRA - no RAS. 2003 pheo W/U Dr Hassell Done reportedly negative.  . Hypoxia 07/23/2017  . Lupus ([place])   . Lymphedema of breast   . Personal history of radiation therapy 2015  . RBBB   . Renal vein thrombosis ([place])   . SBO (small bowel obstruction) ([place]) 09/17/2017  . Secondary hyperparathyroidism ([place])   . Stroke [facility])    Incidental finding MRI 2002 L lacunar infarct  . Vitamin B12 deficiency   . Vitamin D deficiency   . Wears dentures    top    Patient Active  Problem List   Diagnosis Date Noted  . Chronic diarrhea - responds to budesonide 09/10/2019  . Iron deficiency anemia 08/19/2018  . Spondylosis of lumbar spine 12/24/2017  . Renal vein thrombosis ([place]) 09/17/2017  . Hoarse voice quality 09/09/2017  . Low back pain without sciatica 09/09/2017  . Labile blood glucose   . Sleep disturbance   . Leukocytosis   . Essential hypertension   . Urinary frequency   . Type 2 diabetes mellitus with peripheral neuropathy (HCC)   . Benign essential HTN   . Stage 2 chronic kidney disease   . Dysphagia   . Debility   . Encephalopathy   . Pressure injury of skin 08/04/2017  . Pneumonia of left lung due to infectious organism   . Diffuse pulmonary alveolar hemorrhage   . SOB  (shortness of breath) 10/11/2016  . Hyperparathyroidism, secondary renal ([place]) 05/02/2016  . Gout 04/11/2015  . Diabetic neuropathy associated with type 2 diabetes mellitus ([place]) 01/06/2015  . SBO (small bowel obstruction) ([place]) 09/15/2013  . Breast cancer of lower-inner quadrant of right female breast ([place]) 03/18/2013  . Chronic venous insufficiency 01/06/2013  . Health care maintenance 05/08/2011  . Diabetes mellitus type 2 in obese ([place]) 05/03/2010  . Seasonal allergies 05/03/2010  . Labile hypertension   . CAD (coronary artery disease)   . Abdominal discomfort   . Anemia in chronic kidney disease   . History of stroke without residual deficits   . Hyperlipidemia associated with type 2 diabetes mellitus ([place])   . Chronic pain   . Chronic kidney disease, stage III (moderate) (HCC)   . Adnexal mass   . Spinal stenosis, lumbar region, with neurogenic claudication 01/03/2009    Past Surgical History:  Procedure Laterality Date  . ABDOMINAL HYSTERECTOMY    . BILATERAL SALPINGOOPHORECTOMY  8/03   Lap BSO (R ovarian fibroma) and adhesion lysis  . BOWEL RESECTION  2003   ileocolectomy with anastomosis 2/2 adhesions  . BREAST BIOPSY Right 2015  . BREAST BIOPSY Right 07/2014  . BREAST LUMPECTOMY Right 04/22/2013  . BREAST LUMPECTOMY WITH NEEDLE LOCALIZATION AND AXILLARY SENTINEL LYMPH NODE BX Right 04/22/2013   Procedure: BREAST LUMPECTOMY WITH NEEDLE LOCALIZATION AND AXILLARY SENTINEL LYMPH NODE BX;  Surgeon: Stark Klein, MD;  Location: [facility];  Service: General;  Laterality: Right;  . CARDIAC CATHETERIZATION     2 stents  . CHOLECYSTECTOMY    . COLONOSCOPY    . HEMICOLECTOMY     R sided hemicolectomy  . HERNIA REPAIR     Ventral hernia repair  . PTCA  4/06     OB History   No obstetric history on file.     Family History  Problem Relation Age of Onset  . Pancreatic cancer Brother 67  . Breast cancer Mother 38  . Lung cancer Maternal Aunt   . Breast  cancer Maternal Aunt   . Prostate cancer Maternal Uncle   . Heart attack Maternal Grandmother   . Breast cancer Maternal Grandmother   . Colon cancer Maternal Grandmother   . Clotting disorder Maternal Grandmother   . Diabetes Maternal Grandmother   . Kidney failure Maternal Aunt   . Hypertension Daughter     Social History   Tobacco Use  . Smoking status: Never Smoker  . Smokeless tobacco: Never Used  Vaping Use  . Vaping Use: Never used  Substance Use Topics  . Alcohol use: No    Alcohol/week: 0.0 standard drinks  . Drug  use: No    Home Medications Prior to Admission medications   Medication Sig Start Date End Date Taking? Authorizing Provider  acetaminophen (TYLENOL) 325 MG tablet Take 650 mg by mouth every 6 (six) hours as needed for mild pain.    [provider]  amLODipine (NORVASC) 10 MG tablet Take 1 tablet (10 mg total) by mouth daily. 08/22/17   Angiulli, Lavon Paganini, PA-C  atorvastatin (LIPITOR) 40 MG tablet Take 40 mg by mouth daily. 07/27/19   [provider]  budesonide (ENTOCORT EC) 3 MG 24 hr capsule Take 1 capsule by mouth once daily 04/06/20   Gatha Mayer, MD  carvedilol (COREG) 25 MG tablet Take 25 mg by mouth 2 (two) times daily. 06/08/19   [provider]  colestipol (COLESTID) 1 g tablet Take 2 g by mouth at bedtime. 03/17/19   [provider]  desloratadine (CLARINEX) 5 MG tablet Take 5 mg by mouth at bedtime as needed (allergies). Reported on 04/11/2015 04/10/13   [provider]  ferrous fumarate (HEMOCYTE - 106 MG FE) 325 (106 Fe) MG TABS tablet Take 1 tablet by mouth as directed.    [provider]  hydrALAZINE (APRESOLINE) 100 MG tablet TAKE 1 TABLET BY MOUTH 3  TIMES DAILY 01/20/20   Josue Hector, MD  HYDROmorphone (DILAUDID) 2 MG tablet Take 1 tablet (2 mg total) by mouth every 6 (six) hours as needed for severe pain. Patient not taking: Reported on 06/21/2020 06/14/20   Sherwood Gambler, MD  hydrOXYzine  (ATARAX/VISTARIL) 50 MG tablet Take 50 mg by mouth 2 (two) times daily as needed. 03/16/19   [provider]  isosorbide mononitrate (IMDUR) 30 MG 24 hr tablet Take 1/2 (one-half) tablet by mouth once daily 02/13/19   Isaiah Serge, NP  LANTUS SOLOSTAR 100 UNIT/ML Solostar Pen SMARTSIG:14 Unit(s) SUB-Q Every Night 02/26/19   [provider]  linagliptin (TRADJENTA) 5 MG TABS tablet Take 5 mg by mouth daily.    [provider]  Melatonin 5 MG TABS Take 10 mg by mouth at bedtime.    [provider]  nitroGLYCERIN (NITROSTAT) 0.4 MG SL tablet DISSOLVE 1 TABLET UNDER THE TONGUE EVERY 5 MINUTES AS  NEEDED FOR CHEST PAIN. MAX  OF 3 TABLETS IN 15 MINUTES. CALL 911 IF PAIN PERSISTS. 10/28/19   Josue Hector, MD  pantoprazole (PROTONIX) 40 MG tablet Take 1 tablet (40 mg total) by mouth daily before breakfast. 08/27/19   Gatha Mayer, MD  pregabalin (LYRICA) 100 MG capsule Take 1 capsule (100 mg total) by mouth 2 (two) times daily. 06/21/20   Kirsteins, Luanna Salk, MD  torsemide (DEMADEX) 20 MG tablet Take 20 mg by mouth daily. 20 mg every other day, 40 mg days not taking 20 mg 08/05/19   [provider]  ULORIC 40 MG tablet Take 1 tablet by mouth daily. 06/20/16   [provider]  Vitamin D, Ergocalciferol, (DRISDOL) 1.25 MG (50000 UNIT) CAPS capsule Take 1 capsule (50,000 Units total) by mouth every 7 (seven) days. 08/13/19   Truitt Merle, MD    Allergies    Percocet [oxycodone-acetaminophen]; Diazepam; Haloperidol lactate; Lorazepam; Morphine sulfate; Propoxyphene hcl; Tramadol hcl; Anesthetics, halogenated; Etomidate; Fentanyl; and Versed [midazolam]  Review of Systems   Review of Systems  Constitutional: Negative for fever.  HENT: Negative for sore throat.   Eyes: Negative for visual disturbance.  Respiratory: Positive for shortness of breath. Negative for cough, hemoptysis, sputum production and wheezing.  Cardiovascular: Positive for chest pain and  leg swelling.  Gastrointestinal: Negative for abdominal pain and vomiting.  Genitourinary: Negative for dysuria.  Musculoskeletal: Negative for neck pain.  Skin: Negative for rash.  Neurological: Negative for headaches.    Physical Exam Updated Vital Signs BP 139/79 (BP Location: Right Arm)   Pulse 71   Temp 98.1 F (36.7 C) (Oral)   Ht 5' 5"  (1.651 m)   Wt 83 kg   SpO2 98%   BMI 30.45 kg/m   Physical Exam Vitals and nursing note reviewed.  Constitutional:      General: She is not in acute distress.    Appearance: She is well-developed.  HENT:     Head: Normocephalic and atraumatic.  Eyes:     Conjunctiva/sclera: Conjunctivae normal.  Cardiovascular:     Rate and Rhythm: Normal rate and regular rhythm.     Heart sounds: No murmur heard.   Pulmonary:     Effort: Pulmonary effort is normal. No respiratory distress.     Breath sounds: Normal breath sounds.  Abdominal:     Palpations: Abdomen is soft.     Tenderness: There is no abdominal tenderness.  Musculoskeletal:     Cervical back: Neck supple.     Right lower leg: No tenderness. Edema present.     Left lower leg: No tenderness. Edema present.  Skin:    General: Skin is warm and dry.     Capillary Refill: Capillary refill takes less than 2 seconds.  Neurological:     General: No focal deficit present.     Mental Status: She is alert.     ED Results / Procedures / Treatments   Labs (all labs ordered are listed, but only abnormal results are displayed) Labs Reviewed  BASIC METABOLIC PANEL - Abnormal; Notable for the following components:      Result Value   Potassium 5.5 (*)    CO2 20 (*)    Glucose, Bld 120 (*)    BUN 84 (*)    Creatinine, Ser 2.65 (*)    Calcium 8.7 (*)    GFR, Estimated 19 (*)    All other components within normal limits  CBC WITH DIFFERENTIAL/PLATELET - Abnormal; Notable for the following components:   RBC 3.02 (*)    Hemoglobin 8.5 (*)    HCT 27.2 (*)    RDW 17.2 (*)    nRBC  0.6 (*)    Neutro Abs 8.1 (*)    Abs Immature Granulocytes 0.23 (*)    All other components within normal limits  D-DIMER, QUANTITATIVE - Abnormal; Notable for the following components:   D-Dimer, Quant 4.98 (*)    All other components within normal limits  URINALYSIS, ROUTINE W REFLEX MICROSCOPIC - Abnormal; Notable for the following components:   Protein, ur TRACE (*)    All other components within normal limits  CBG MONITORING, ED - Abnormal; Notable for the following components:   Glucose-Capillary 64 (*)    All other components within normal limits  RESP PANEL BY RT-PCR (FLU A&B, COVID) ARPGX2  BRAIN NATRIURETIC PEPTIDE  HEPARIN LEVEL (UNFRACTIONATED)  TROPONIN I (HIGH SENSITIVITY)  TROPONIN I (HIGH SENSITIVITY)    EKG None  Radiology DG Chest Port 1 View  Result Date: 06/28/2020 CLINICAL DATA:  Shortness of breath. EXAM: PORTABLE CHEST 1 VIEW COMPARISON:  May 23, 2019 FINDINGS: There is mild to moderate severity enlargement of the cardiac silhouette. Both lungs are clear. Radiopaque surgical clips are seen overlying the lateral  aspect of the right chest wall and right upper quadrant. Degenerative changes seen throughout the thoracic spine. IMPRESSION: 1. Mild to moderate severity cardiomegaly without acute or active cardiopulmonary disease. Electronically Signed   By: Virgina Norfolk M.D.   On: 06/28/2020 20:07    Procedures Procedures   Medications Ordered in ED Medications - No data to display  ED Course  I have reviewed the triage vital signs and the nursing notes.  Pertinent labs & imaging results that were available during my care of the patient were reviewed by me and considered in my medical decision making (see chart for details).  Clinical Course as of 06/29/20 1001  Tue Jun 28, 2020  1952 Chest x-ray interpreted by me as cardiomegaly and may be a little bit of cephalization.  Awaiting radiology reading. [MB]  2018 Cardiac echo 6/20 - 1. The left ventricle  has normal systolic function with an ejection  fraction of 60-65%. The cavity size was normal. There is moderate  asymmetric left ventricular hypertrophy. Left ventricular diastolic  Doppler parameters are consistent with impaired  relaxation. Indeterminate filling pressures The E/e' is 8-15. No evidence  of left ventricular regional wall motion abnormalities.  2. The right ventricle has normal systolic function. The cavity was  normal. There is no increase in right ventricular wall thickness.  3. The mitral valve is abnormal. Mild thickening of the mitral valve  leaflet.  4. The tricuspid valve is grossly normal.  5. The aortic valve is grossly normal. No stenosis of the aortic valve.  6. The average left ventricular global longitudinal strain is -19.6 % [MB]  2131 EKG not crossing in epic.  Sinus rhythm rate of 72 right bundle branch block, nonspecific ST-T changes not significantly different from prior 3/21 [MB]  [place] conversation with daughter and patient.  The anemia is been an ongoing problem.  Her creatinine is also been elevated in the past.  They would like her to get a dose of Lokelma.  They asked if we can do a D-dimer and if it is negative then they would be comfortable with her going home.  If it is elevated and they will accept admission to get further testing. [MB]  2315 Daughter called me back in the room and they now would rather her get admitted so we can be sure regarding her shortness of breath. [MB]  2329 Discussed with Dr. Florene Route [facility] hospitalist who recommends starting the patient on heparin and he will admit the patient for further work-up including likely a nuclear study. [MB]    Clinical Course User Index [MB] Hayden Rasmussen, MD   MDM Rules/Calculators/A&P                         This patient complains of shortness of breath and chest pain; this involves an extensive number of treatment Options and is a complaint that carries with it a high risk of  complications and Morbidity. The differential includes pneumonia, CHF, ACS, pneumothorax, vascular, PE  I ordered, reviewed and interpreted labs, which included CBC with normal white count, hemoglobin slightly lower than baseline, chemistries with elevated potassium, elevated BUN and creatinine, urinalysis unremarkable, troponins flat, D-dimer markedly elevated, BNP normal I ordered medication Lokelma for hyperkalemia, heparin infusion for possible PE I ordered imaging studies which included chest x-ray and I independently    visualized and interpreted imaging which showed no acute infiltrates Additional history obtained from patient's 2 daughters Previous records obtained  and reviewed in epic, no recent admissions I consulted [facility] hospitalist Dr. Tonie Griffith And discussed lab and imaging findings  Critical Interventions: None  After the interventions stated above, I reevaluated the patient and found patient to be minimally symptomatic resting in bed.  She is agreeable to admission for further work-up of her shortness of breath.  Likely will need a VQ scan to assess for PE.    Final Clinical Impression(s) / ED Diagnoses Final diagnoses:  DOE (dyspnea on exertion)  Symptomatic anemia  Hyperkalemia  AKI (acute kidney injury) ([place])    Rx / DC Orders ED Discharge Orders    None       Hayden Rasmussen, MD 06/29/20 1004

## 2020-06-28 NOTE — ED Triage Notes (Signed)
Patient states she has had shortness of breath since this morning with movement and talking . Patient is able to speak in complete sentences at this time

## 2020-06-29 ENCOUNTER — Inpatient Hospital Stay (HOSPITAL_COMMUNITY): Payer: Medicare Other

## 2020-06-29 DIAGNOSIS — I5032 Chronic diastolic (congestive) heart failure: Secondary | ICD-10-CM | POA: Diagnosis present

## 2020-06-29 DIAGNOSIS — Z9189 Other specified personal risk factors, not elsewhere classified: Secondary | ICD-10-CM | POA: Diagnosis not present

## 2020-06-29 DIAGNOSIS — M109 Gout, unspecified: Secondary | ICD-10-CM | POA: Diagnosis present

## 2020-06-29 DIAGNOSIS — Z90710 Acquired absence of both cervix and uterus: Secondary | ICD-10-CM | POA: Diagnosis not present

## 2020-06-29 DIAGNOSIS — W19XXXA Unspecified fall, initial encounter: Secondary | ICD-10-CM | POA: Diagnosis not present

## 2020-06-29 DIAGNOSIS — I82451 Acute embolism and thrombosis of right peroneal vein: Secondary | ICD-10-CM | POA: Diagnosis present

## 2020-06-29 DIAGNOSIS — I130 Hypertensive heart and chronic kidney disease with heart failure and stage 1 through stage 4 chronic kidney disease, or unspecified chronic kidney disease: Secondary | ICD-10-CM | POA: Diagnosis present

## 2020-06-29 DIAGNOSIS — R0602 Shortness of breath: Secondary | ICD-10-CM | POA: Diagnosis not present

## 2020-06-29 DIAGNOSIS — Z20822 Contact with and (suspected) exposure to covid-19: Secondary | ICD-10-CM | POA: Diagnosis present

## 2020-06-29 DIAGNOSIS — R0600 Dyspnea, unspecified: Secondary | ICD-10-CM | POA: Diagnosis not present

## 2020-06-29 DIAGNOSIS — R0609 Other forms of dyspnea: Secondary | ICD-10-CM | POA: Diagnosis present

## 2020-06-29 DIAGNOSIS — Z8249 Family history of ischemic heart disease and other diseases of the circulatory system: Secondary | ICD-10-CM | POA: Diagnosis not present

## 2020-06-29 DIAGNOSIS — Z8673 Personal history of transient ischemic attack (TIA), and cerebral infarction without residual deficits: Secondary | ICD-10-CM | POA: Diagnosis not present

## 2020-06-29 DIAGNOSIS — E875 Hyperkalemia: Secondary | ICD-10-CM | POA: Diagnosis present

## 2020-06-29 DIAGNOSIS — Z923 Personal history of irradiation: Secondary | ICD-10-CM | POA: Diagnosis not present

## 2020-06-29 DIAGNOSIS — N179 Acute kidney failure, unspecified: Secondary | ICD-10-CM | POA: Diagnosis present

## 2020-06-29 DIAGNOSIS — Z91048 Other nonmedicinal substance allergy status: Secondary | ICD-10-CM | POA: Diagnosis not present

## 2020-06-29 DIAGNOSIS — Z9049 Acquired absence of other specified parts of digestive tract: Secondary | ICD-10-CM | POA: Diagnosis not present

## 2020-06-29 DIAGNOSIS — Z853 Personal history of malignant neoplasm of breast: Secondary | ICD-10-CM | POA: Diagnosis not present

## 2020-06-29 DIAGNOSIS — N184 Chronic kidney disease, stage 4 (severe): Secondary | ICD-10-CM | POA: Diagnosis present

## 2020-06-29 DIAGNOSIS — E1122 Type 2 diabetes mellitus with diabetic chronic kidney disease: Secondary | ICD-10-CM | POA: Diagnosis present

## 2020-06-29 DIAGNOSIS — R7989 Other specified abnormal findings of blood chemistry: Secondary | ICD-10-CM

## 2020-06-29 DIAGNOSIS — M549 Dorsalgia, unspecified: Secondary | ICD-10-CM | POA: Diagnosis not present

## 2020-06-29 DIAGNOSIS — Z803 Family history of malignant neoplasm of breast: Secondary | ICD-10-CM | POA: Diagnosis not present

## 2020-06-29 DIAGNOSIS — I2510 Atherosclerotic heart disease of native coronary artery without angina pectoris: Secondary | ICD-10-CM | POA: Diagnosis present

## 2020-06-29 DIAGNOSIS — R600 Localized edema: Secondary | ICD-10-CM | POA: Diagnosis present

## 2020-06-29 DIAGNOSIS — D631 Anemia in chronic kidney disease: Secondary | ICD-10-CM | POA: Diagnosis present

## 2020-06-29 DIAGNOSIS — E11649 Type 2 diabetes mellitus with hypoglycemia without coma: Secondary | ICD-10-CM | POA: Diagnosis present

## 2020-06-29 DIAGNOSIS — I272 Pulmonary hypertension, unspecified: Secondary | ICD-10-CM | POA: Diagnosis present

## 2020-06-29 LAB — CBC WITH DIFFERENTIAL/PLATELET
Abs Immature Granulocytes: 0.35 10*3/uL — ABNORMAL HIGH (ref 0.00–0.07)
Basophils Absolute: 0.1 10*3/uL (ref 0.0–0.1)
Basophils Relative: 0 %
Eosinophils Absolute: 0.1 10*3/uL (ref 0.0–0.5)
Eosinophils Relative: 1 %
HCT: 33.2 % — ABNORMAL LOW (ref 36.0–46.0)
Hemoglobin: 10.4 g/dL — ABNORMAL LOW (ref 12.0–15.0)
Immature Granulocytes: 3 %
Lymphocytes Relative: 15 %
Lymphs Abs: 1.7 10*3/uL (ref 0.7–4.0)
MCH: 28.5 pg (ref 26.0–34.0)
MCHC: 31.3 g/dL (ref 30.0–36.0)
MCV: 91 fL (ref 80.0–100.0)
Monocytes Absolute: 0.8 10*3/uL (ref 0.1–1.0)
Monocytes Relative: 7 %
Neutro Abs: 8.7 10*3/uL — ABNORMAL HIGH (ref 1.7–7.7)
Neutrophils Relative %: 74 %
Platelets: 179 10*3/uL (ref 150–400)
RBC: 3.65 MIL/uL — ABNORMAL LOW (ref 3.87–5.11)
RDW: 17.4 % — ABNORMAL HIGH (ref 11.5–15.5)
WBC: 11.7 10*3/uL — ABNORMAL HIGH (ref 4.0–10.5)
nRBC: 0.3 % — ABNORMAL HIGH (ref 0.0–0.2)

## 2020-06-29 LAB — RENAL FUNCTION PANEL
Albumin: 3.2 g/dL — ABNORMAL LOW (ref 3.5–5.0)
Anion gap: 9 (ref 5–15)
BUN: 71 mg/dL — ABNORMAL HIGH (ref 8–23)
CO2: 19 mmol/L — ABNORMAL LOW (ref 22–32)
Calcium: 8.9 mg/dL (ref 8.9–10.3)
Chloride: 115 mmol/L — ABNORMAL HIGH (ref 98–111)
Creatinine, Ser: 2.11 mg/dL — ABNORMAL HIGH (ref 0.44–1.00)
GFR, Estimated: 24 mL/min — ABNORMAL LOW (ref >60)
Glucose, Bld: 119 mg/dL — ABNORMAL HIGH (ref 70–99)
Phosphorus: 3.2 mg/dL (ref 2.5–4.6)
Potassium: 4.9 mmol/L (ref 3.5–5.1)
Sodium: 143 mmol/L (ref 135–145)

## 2020-06-29 LAB — RESP PANEL BY RT-PCR (FLU A&B, COVID) ARPGX2
Influenza A by PCR: NEGATIVE
Influenza B by PCR: NEGATIVE
SARS Coronavirus 2 by RT PCR: NEGATIVE

## 2020-06-29 LAB — URINALYSIS, ROUTINE W REFLEX MICROSCOPIC
Bilirubin Urine: NEGATIVE
Glucose, UA: NEGATIVE mg/dL
Hgb urine dipstick: NEGATIVE
Ketones, ur: NEGATIVE mg/dL
Leukocytes,Ua: NEGATIVE
Nitrite: NEGATIVE
Specific Gravity, Urine: 1.013 (ref 1.005–1.030)
pH: 5 (ref 5.0–8.0)

## 2020-06-29 LAB — HEMOGLOBIN A1C
Hgb A1c MFr Bld: 6.6 % — ABNORMAL HIGH (ref 4.8–5.6)
Mean Plasma Glucose: 142.72 mg/dL

## 2020-06-29 LAB — GLUCOSE, CAPILLARY
Glucose-Capillary: 100 mg/dL — ABNORMAL HIGH (ref 70–99)
Glucose-Capillary: 125 mg/dL — ABNORMAL HIGH (ref 70–99)
Glucose-Capillary: 158 mg/dL — ABNORMAL HIGH (ref 70–99)

## 2020-06-29 LAB — TROPONIN I (HIGH SENSITIVITY): Troponin I (High Sensitivity): 12 ng/L (ref <18)

## 2020-06-29 LAB — CBG MONITORING, ED: Glucose-Capillary: 64 mg/dL — ABNORMAL LOW (ref 70–99)

## 2020-06-29 LAB — HEPARIN LEVEL (UNFRACTIONATED): Heparin Unfractionated: 0.99 IU/mL — ABNORMAL HIGH (ref 0.30–0.70)

## 2020-06-29 MED ORDER — NITROGLYCERIN 0.4 MG SL SUBL: 0.4000 mg | SUBLINGUAL_TABLET | SUBLINGUAL | Status: DC | PRN

## 2020-06-29 MED ORDER — CARVEDILOL 25 MG PO TABS
25.0000 mg | ORAL_TABLET | Freq: Two times a day (BID) | ORAL | Status: DC
  Administered 2020-06-29 – 2020-06-30 (×3): 25 mg via ORAL
  Filled 2020-06-29 (×3): qty 1

## 2020-06-29 MED ORDER — PANTOPRAZOLE SODIUM 40 MG PO TBEC
40.0000 mg | DELAYED_RELEASE_TABLET | Freq: Every day | ORAL | Status: DC
  Administered 2020-06-30 – 2020-07-01 (×2): 40 mg via ORAL
  Filled 2020-06-29 (×2): qty 1

## 2020-06-29 MED ORDER — AMLODIPINE BESYLATE 10 MG PO TABS
10.0000 mg | ORAL_TABLET | Freq: Every day | ORAL | Status: DC
  Administered 2020-06-29 – 2020-07-01 (×3): 10 mg via ORAL
  Filled 2020-06-29 (×3): qty 1

## 2020-06-29 MED ORDER — FERROUS FUMARATE 324 (106 FE) MG PO TABS: 106.0000 mg | ORAL_TABLET | Freq: Every day | ORAL | Status: DC

## 2020-06-29 MED ORDER — TECHNETIUM TO 99M ALBUMIN AGGREGATED
4.0000 | Freq: Once | INTRAVENOUS | Status: AC | PRN
  Administered 2020-06-29: 4 via INTRAVENOUS

## 2020-06-29 MED ORDER — FERROUS FUMARATE 324 (106 FE) MG PO TABS
106.0000 mg | ORAL_TABLET | Freq: Every day | ORAL | Status: DC
  Administered 2020-06-29 – 2020-07-01 (×3): 106 mg via ORAL
  Filled 2020-06-29 (×3): qty 1

## 2020-06-29 MED ORDER — ACETAMINOPHEN 650 MG RE SUPP: 650.0000 mg | Freq: Four times a day (QID) | RECTAL | Status: DC | PRN

## 2020-06-29 MED ORDER — ONDANSETRON HCL 4 MG PO TABS: 4.0000 mg | ORAL_TABLET | Freq: Four times a day (QID) | ORAL | Status: DC | PRN

## 2020-06-29 MED ORDER — FEBUXOSTAT 40 MG PO TABS
40.0000 mg | ORAL_TABLET | Freq: Every day | ORAL | Status: DC
  Administered 2020-06-29 – 2020-06-30 (×2): 40 mg via ORAL
  Filled 2020-06-29 (×2): qty 1

## 2020-06-29 MED ORDER — INSULIN ASPART 100 UNIT/ML ~~LOC~~ SOLN: 0.0000 [IU] | Freq: Three times a day (TID) | SUBCUTANEOUS | Status: DC

## 2020-06-29 MED ORDER — SODIUM CHLORIDE 0.9 % IV SOLN: INTRAVENOUS | Status: AC

## 2020-06-29 MED ORDER — PREGABALIN 50 MG PO CAPS
100.0000 mg | ORAL_CAPSULE | Freq: Every day | ORAL | Status: DC
  Administered 2020-06-29: 100 mg via ORAL
  Filled 2020-06-29: qty 2

## 2020-06-29 MED ORDER — GABAPENTIN 300 MG PO CAPS
300.0000 mg | ORAL_CAPSULE | Freq: Every day | ORAL | Status: DC
  Administered 2020-06-29: 300 mg via ORAL
  Filled 2020-06-29: qty 1

## 2020-06-29 MED ORDER — FERROUS FUMARATE 324 (106 FE) MG PO TABS
325.0000 mg | ORAL_TABLET | Freq: Every day | ORAL | Status: DC
  Filled 2020-06-29: qty 3

## 2020-06-29 MED ORDER — HYDRALAZINE HCL 50 MG PO TABS
100.0000 mg | ORAL_TABLET | Freq: Three times a day (TID) | ORAL | Status: DC
  Administered 2020-06-29 – 2020-07-01 (×7): 100 mg via ORAL
  Filled 2020-06-29 (×7): qty 2

## 2020-06-29 MED ORDER — ALBUTEROL SULFATE (2.5 MG/3ML) 0.083% IN NEBU: 2.5000 mg | INHALATION_SOLUTION | RESPIRATORY_TRACT | Status: DC | PRN

## 2020-06-29 MED ORDER — ONDANSETRON HCL 4 MG/2ML IJ SOLN
4.0000 mg | Freq: Four times a day (QID) | INTRAMUSCULAR | Status: DC | PRN
Start: 2020-06-29 — End: 2020-07-01

## 2020-06-29 MED ORDER — HYDROMORPHONE HCL 2 MG PO TABS
2.0000 mg | ORAL_TABLET | Freq: Four times a day (QID) | ORAL | Status: DC | PRN
  Administered 2020-06-29 – 2020-07-01 (×4): 2 mg via ORAL
  Filled 2020-06-29 (×4): qty 1

## 2020-06-29 MED ORDER — COLESTIPOL HCL 1 G PO TABS
2.0000 g | ORAL_TABLET | Freq: Every day | ORAL | Status: DC
  Administered 2020-06-29 – 2020-06-30 (×2): 2 g via ORAL
  Filled 2020-06-29 (×2): qty 2

## 2020-06-29 MED ORDER — ACETAMINOPHEN 325 MG PO TABS: 650.0000 mg | ORAL_TABLET | Freq: Four times a day (QID) | ORAL | Status: DC | PRN

## 2020-06-29 MED ORDER — ATORVASTATIN CALCIUM 40 MG PO TABS
40.0000 mg | ORAL_TABLET | Freq: Every day | ORAL | Status: DC
  Administered 2020-06-29 – 2020-07-01 (×3): 40 mg via ORAL
  Filled 2020-06-29 (×4): qty 1

## 2020-06-29 MED ORDER — MELATONIN 5 MG PO TABS
10.0000 mg | ORAL_TABLET | Freq: Every day | ORAL | Status: DC
  Administered 2020-06-29 – 2020-06-30 (×2): 10 mg via ORAL
  Filled 2020-06-29 (×2): qty 2

## 2020-06-29 MED ORDER — LOPERAMIDE HCL 2 MG PO CAPS
2.0000 mg | ORAL_CAPSULE | ORAL | Status: DC | PRN
  Administered 2020-06-29 (×2): 2 mg via ORAL
  Filled 2020-06-29 (×2): qty 1

## 2020-06-29 MED ORDER — LORATADINE 10 MG PO TABS
10.0000 mg | ORAL_TABLET | Freq: Every day | ORAL | Status: DC
  Administered 2020-06-29 – 2020-07-01 (×3): 10 mg via ORAL
  Filled 2020-06-29 (×3): qty 1

## 2020-06-29 NOTE — H&P (Signed)
History and Physical    [name] LPF:[id number] DOB: 1945-08-21 DOA: 06/28/2020  PCP: Seward Carol, MD  Patient coming from: Home  Chief Complaint: dyspnea  HPI: [name] is a 75 y.o. female with medical history significant of CKD4, DM2, HTN. Presenting with dyspnea. Hx from family and patient. Her family at bedside reports that the patient was in her normal state of health until yesterday. She was doing some spring cleaning yesterday morning. Her family noted that she was short of breath during and after cleaning. She was unable to speak in full sentences and she was breathing fast. They checked her pulse ox. It was 96%. They checked her HR. It was 65 at rest and 94 w/ ambulation. The patient says she wasn't aware that she was short of breath. She denies any chest pain at the time. The family became concern because she did not appear to be normal. They brought her to the ED for eval. She denies any other alleviating or aggravating factors.     ED Course: In the ED, her BNP was wnl. Her trp were neg x 2. Her EKG was stable. Her d-dimer was elevated at 4.98. Her CXR was neg for acute disease. She was started on a heparin gtt. TRH was called for admission.   Review of Systems:  Denies CP, palpitations, syncopal episodes, increased edema, N/V/D, sick contacts. Review of systems is otherwise negative for all not mentioned in HPI.   PMHx Past Medical History:  Diagnosis Date  . Abdominal discomfort    Chronic N/V/D. Presumptive dx Crohn's dx per elevated p ANCA. Failed Entocort and Pentasa. Sep 2003 - ileocolectomy c anastomosis per Dr Deon Pilling 2/2 adhesions - path was hegative for Crohns. EGD, Sm bowel follow through (11/03), and an eteroclysis (10/03) were unrevealing. Cuases hypomag and hypocalcemia.  . Adnexal mass 8/03   s/p lap BSO (R ovarian fibroma) & lysis of adhesions  . Allergy    Seasonal  . Anemia    Multifactorial. Baseline HgB 10-11 ish. B12 def - 150 in 3/10.  Fe Def - ferritin 35 3/10. Both are being repleted.  . Breast cancer ([place]) 03/16/13   right, 5 o'clock  . CAD (coronary artery disease) 1996   1996 - PTCA and angioplasty diagonal branch. 2000 - Rotoblator & angiopllasty of diagonal. 2006 - subendocardial AMI, DES to proximal LAD.Marland Kitchen Also had 90% stenosis in distal apical LAD. EF 55 with apical hypokinesis. Indefinite ASA and Plavix.  . CHF (congestive heart failure) ([place])   . Chronic kidney disease    Chronic renal insuff baseline Cr 1.2 - 1.4 ish.  . Chronic pain    CT 10/10 = Spinal stenosis L2 - S1.  . Diabetes mellitus    Insulin dependent  . Gout   . Hx of radiation therapy 06/02/13- 07/16/13   right rbeast 4500 cGy 25 sessions, right breast boost 1600 cGy in 8 sessions  . Hyperlipidemia    Managed with both a statin and Welchol. Welchol stopped 2014 2/2 cost and started on fenofibrate   . Hypertension    2006 B renal arteries patent. 2003 MRA - no RAS. 2003 pheo W/U Dr Hassell Done reportedly negative.  . Hypoxia 07/23/2017  . Lupus ([place])   . Lymphedema of breast   . Personal history of radiation therapy 2015  . RBBB   . Renal vein thrombosis ([place])   . SBO (small bowel obstruction) ([place]) 09/17/2017  . Secondary hyperparathyroidism ([place])   . Stroke [facility])  Incidental finding MRI 2002 L lacunar infarct  . Vitamin B12 deficiency   . Vitamin D deficiency   . Wears dentures    top    PSHx Past Surgical History:  Procedure Laterality Date  . ABDOMINAL HYSTERECTOMY    . BILATERAL SALPINGOOPHORECTOMY  8/03   Lap BSO (R ovarian fibroma) and adhesion lysis  . BOWEL RESECTION  2003   ileocolectomy with anastomosis 2/2 adhesions  . BREAST BIOPSY Right 2015  . BREAST BIOPSY Right 07/2014  . BREAST LUMPECTOMY Right 04/22/2013  . BREAST LUMPECTOMY WITH NEEDLE LOCALIZATION AND AXILLARY SENTINEL LYMPH NODE BX Right 04/22/2013   Procedure: BREAST LUMPECTOMY WITH NEEDLE LOCALIZATION AND AXILLARY SENTINEL LYMPH NODE BX;  Surgeon: Stark Klein,  MD;  Location: [facility];  Service: General;  Laterality: Right;  . CARDIAC CATHETERIZATION     2 stents  . CHOLECYSTECTOMY    . COLONOSCOPY    . HEMICOLECTOMY     R sided hemicolectomy  . HERNIA REPAIR     Ventral hernia repair  . PTCA  4/06    SocHx  reports that she has never smoked. She has never used smokeless tobacco. She reports that she does not drink alcohol and does not use drugs.  Allergies  Allergen Reactions  . Percocet [Oxycodone-Acetaminophen] Other (See Comments)    headaches  . Diazepam Other (See Comments)    REACTION: Agitation  . Haloperidol Lactate Nausea And Vomiting  . Lorazepam Nausea Only  . Morphine Sulfate Other (See Comments)    REACTION: Agitation  . Propoxyphene Hcl Nausea And Vomiting  . Tramadol Hcl Nausea And Vomiting  . Anesthetics, Halogenated     Per patient's daughter, she can not tolerate anesthetics.   . Etomidate     Heart stopped during anesthesia  . Fentanyl     Heart stopped during anesthesia   . Versed [Midazolam]     Heart stopped during anesthesia    FamHx Family History  Problem Relation Age of Onset  . Pancreatic cancer Brother 65  . Breast cancer Mother 24  . Lung cancer Maternal Aunt   . Breast cancer Maternal Aunt   . Prostate cancer Maternal Uncle   . Heart attack Maternal Grandmother   . Breast cancer Maternal Grandmother   . Colon cancer Maternal Grandmother   . Clotting disorder Maternal Grandmother   . Diabetes Maternal Grandmother   . Kidney failure Maternal Aunt   . Hypertension Daughter     Prior to Admission medications   Medication Sig Start Date End Date Taking? Authorizing Provider  acetaminophen (TYLENOL) 325 MG tablet Take 650 mg by mouth every 6 (six) hours as needed for mild pain.    [provider]  amLODipine (NORVASC) 10 MG tablet Take 1 tablet (10 mg total) by mouth daily. 08/22/17   Angiulli, Lavon Paganini, PA-C  atorvastatin (LIPITOR) 40 MG tablet Take 40 mg by mouth  daily. 07/27/19   [provider]  budesonide (ENTOCORT EC) 3 MG 24 hr capsule Take 1 capsule by mouth once daily 04/06/20   Gatha Mayer, MD  carvedilol (COREG) 25 MG tablet Take 25 mg by mouth 2 (two) times daily. 06/08/19   [provider]  colestipol (COLESTID) 1 g tablet Take 2 g by mouth at bedtime. 03/17/19   [provider]  desloratadine (CLARINEX) 5 MG tablet Take 5 mg by mouth at bedtime as needed (allergies). Reported on 04/11/2015 04/10/13   [provider]  ferrous fumarate (HEMOCYTE -  106 MG FE) 325 (106 Fe) MG TABS tablet Take 1 tablet by mouth as directed.    [provider]  hydrALAZINE (APRESOLINE) 100 MG tablet TAKE 1 TABLET BY MOUTH 3  TIMES DAILY 01/20/20   Josue Hector, MD  HYDROmorphone (DILAUDID) 2 MG tablet Take 1 tablet (2 mg total) by mouth every 6 (six) hours as needed for severe pain. Patient not taking: Reported on 06/21/2020 06/14/20   Sherwood Gambler, MD  hydrOXYzine (ATARAX/VISTARIL) 50 MG tablet Take 50 mg by mouth 2 (two) times daily as needed. 03/16/19   [provider]  isosorbide mononitrate (IMDUR) 30 MG 24 hr tablet Take 1/2 (one-half) tablet by mouth once daily 02/13/19   Isaiah Serge, NP  LANTUS SOLOSTAR 100 UNIT/ML Solostar Pen SMARTSIG:14 Unit(s) SUB-Q Every Night 02/26/19   [provider]  linagliptin (TRADJENTA) 5 MG TABS tablet Take 5 mg by mouth daily.    [provider]  Melatonin 5 MG TABS Take 10 mg by mouth at bedtime.    [provider]  nitroGLYCERIN (NITROSTAT) 0.4 MG SL tablet DISSOLVE 1 TABLET UNDER THE TONGUE EVERY 5 MINUTES AS  NEEDED FOR CHEST PAIN. MAX  OF 3 TABLETS IN 15 MINUTES. CALL 911 IF PAIN PERSISTS. 10/28/19   Josue Hector, MD  pantoprazole (PROTONIX) 40 MG tablet Take 1 tablet (40 mg total) by mouth daily before breakfast. 08/27/19   Gatha Mayer, MD  pregabalin (LYRICA) 100 MG capsule Take 1 capsule (100 mg total) by mouth 2 (two) times daily.  06/21/20   Kirsteins, Luanna Salk, MD  torsemide (DEMADEX) 20 MG tablet Take 20 mg by mouth daily. 20 mg every other day, 40 mg days not taking 20 mg 08/05/19   [provider]  ULORIC 40 MG tablet Take 1 tablet by mouth daily. 06/20/16   [provider]  Vitamin D, Ergocalciferol, (DRISDOL) 1.25 MG (50000 UNIT) CAPS capsule Take 1 capsule (50,000 Units total) by mouth every 7 (seven) days. 08/13/19   Truitt Merle, MD    Physical Exam: Vitals:   06/29/20 0600 06/29/20 0630 06/29/20 0700 06/29/20 0912  BP: (!) 162/72 (!) 155/66 (!) 143/69 (!) 178/74  Pulse: 87 89 84 94  Resp: 15 16 16 20   Temp:    98.5 F (36.9 C)  TempSrc:    Oral  SpO2: 96% 99% 95% 99%  Weight:      Height:        General: 75 y.o. female resting in bed in NAD Eyes: PERRL, normal sclera ENMT: Nares patent w/o discharge, orophaynx clear, dentition normal, ears w/o discharge/lesions/ulcers Neck: Supple, trachea midline Cardiovascular: RRR, +S1, S2, no m/g/r, equal pulses throughout Respiratory: CTABL, no w/r/r, normal WOB GI: BS+, NDNT, no masses noted, no organomegaly noted MSK: No c/c; ble 1+ edema; LL back spasm/pain Skin: No rashes, bruises, ulcerations noted Neuro: A&O x 3, no focal deficits Psyc: Appropriate interaction and affect, calm/cooperative  Labs on Admission: I have personally reviewed following labs and imaging studies  CBC: Recent Labs  Lab 06/28/20 2056  WBC 10.5  NEUTROABS 8.1*  HGB 8.5*  HCT 27.2*  MCV 90.1  PLT 275   Basic Metabolic Panel: Recent Labs  Lab 06/28/20 2056  NA 138  K 5.5*  CL 109  CO2 20*  GLUCOSE 120*  BUN 84*  CREATININE 2.65*  CALCIUM 8.7*   GFR: Estimated Creatinine Clearance: 20.4 mL/min (A) (by C-G formula based on SCr of 2.65 mg/dL (H)). Liver Function Tests:  No results for input(s): AST, ALT, ALKPHOS, BILITOT, PROT, ALBUMIN in the last 168 hours. No results for input(s): LIPASE, AMYLASE in the last 168 hours. No results for input(s): AMMONIA  in the last 168 hours. Coagulation Profile: No results for input(s): INR, PROTIME in the last 168 hours. Cardiac Enzymes: No results for input(s): CKTOTAL, CKMB, CKMBINDEX, TROPONINI in the last 168 hours. BNP (last 3 results) No results for input(s): PROBNP in the last 8760 hours. HbA1C: No results for input(s): HGBA1C in the last 72 hours. CBG: Recent Labs  Lab 06/29/20 0824  GLUCAP 64*   Lipid Profile: No results for input(s): CHOL, HDL, LDLCALC, TRIG, CHOLHDL, LDLDIRECT in the last 72 hours. Thyroid Function Tests: No results for input(s): TSH, T4TOTAL, FREET4, T3FREE, THYROIDAB in the last 72 hours. Anemia Panel: No results for input(s): VITAMINB12, FOLATE, FERRITIN, TIBC, IRON, RETICCTPCT in the last 72 hours. Urine analysis:    Component Value Date/Time   COLORURINE YELLOW 06/28/2020 2353   APPEARANCEUR CLEAR 06/28/2020 2353   APPEARANCEUR Clear 08/22/2018 1620   LABSPEC 1.013 06/28/2020 2353   LABSPEC 1.020 03/03/2014 1341   PHURINE 5.0 06/28/2020 2353   GLUCOSEU NEGATIVE 06/28/2020 2353   GLUCOSEU Negative 03/03/2014 1341   HGBUR NEGATIVE 06/28/2020 2353   BILIRUBINUR NEGATIVE 06/28/2020 2353   BILIRUBINUR Negative 08/22/2018 1620   BILIRUBINUR Negative 03/03/2014 1341   KETONESUR NEGATIVE 06/28/2020 2353   PROTEINUR TRACE (A) 06/28/2020 2353   UROBILINOGEN 0.2 03/03/2014 1341   NITRITE NEGATIVE 06/28/2020 2353   LEUKOCYTESUR NEGATIVE 06/28/2020 2353   LEUKOCYTESUR Negative 03/03/2014 1341    Radiological Exams on Admission: DG Chest Port 1 View  Result Date: 06/28/2020 CLINICAL DATA:  Shortness of breath. EXAM: PORTABLE CHEST 1 VIEW COMPARISON:  May 23, 2019 FINDINGS: There is mild to moderate severity enlargement of the cardiac silhouette. Both lungs are clear. Radiopaque surgical clips are seen overlying the lateral aspect of the right chest wall and right upper quadrant. Degenerative changes seen throughout the thoracic spine. IMPRESSION: 1. Mild to  moderate severity cardiomegaly without acute or active cardiopulmonary disease. Electronically Signed   By: Virgina Norfolk M.D.   On: 06/28/2020 20:07    EKG: Independently reviewed. Sinus, RBBB as previously seen  Assessment/Plan Dyspnea Elevated d-dimer     - admitted to inpt, tele     - concern for clot; so she was started on heparin gtt     - check V/Q d/t renal function; also check ble dopplers     - heparin to be monitored by pharm     - she denies CP, current dyspnea, palpitations; she is not on an anticoagulant at baseline     - BNP is normal and her edema is at baseline per her report; hold on echo unless PTE seen     - TRP is neg x 2 and she denies CP; will continue tele monitor     - she had COVID in January of this year; ?post inflammatory response? Check AM inflammatory marker; her lungs actually sound good (no wheeze noted and she has good air movement)  AKI on CKD4     - her SCr seems to be up from baseline as well as BUN     - check renal function panel     - hold demadex today and give small amount of fluids; reassess volume status in AM  CAD     - continue BB, statin once med list confirmed  DM2 Hypoglycemia     -  her A1c is ~5.8 during her last PCP visit in March     - she says she is still taking lantus; hold for now     - SSI, DM diet, glucose checks  Back pain     - no CVA tenderness     - she was spring cleaning yesterday; PRN pain control  HTN     - resume home meds once confirmed  Normocytic anemia     - no evidence of bleed; follow  Hyperkalemia     - check renal function panel; add lokelma if still elevated  DVT prophylaxis: heparin gtt  Code Status: FULL  Family Communication: w/ dtr at bedside  Consults called: None   Status is: Inpatient  Remains inpatient appropriate because:Inpatient level of care appropriate due to severity of illness   Dispo: The patient is from: Home              Anticipated d/c is to: Home               Patient currently is not medically stable to d/c.   Difficult to place patient No  Time spent coordinating admission: 75 minutes  [facility]  If 7PM-7AM, please contact night-coverage [email or web address]  06/29/2020, 9:22 AM

## 2020-06-29 NOTE — Progress Notes (Signed)
ANTICOAGULATION CONSULT NOTE - Follow Up Consult  Pharmacy Consult for heparin Indication: acute DVT; r/o PE  Allergies  Allergen Reactions  . Percocet [Oxycodone-Acetaminophen] Other (See Comments)    headaches  . Diazepam Other (See Comments)    REACTION: Agitation  . Haloperidol Lactate Nausea And Vomiting  . Lorazepam Nausea Only  . Morphine Sulfate Other (See Comments)    REACTION: Agitation  . Propoxyphene Hcl Nausea And Vomiting  . Tramadol Hcl Nausea And Vomiting  . Anesthetics, Halogenated     Per patient's daughter, she can not tolerate anesthetics.   . Etomidate     Heart stopped during anesthesia  . Fentanyl     Heart stopped during anesthesia   . Tramadol Hcl     Other reaction(s): nausea & vomitin  . Versed [Midazolam]     Heart stopped during anesthesia    Patient Measurements: Height: 5' 5"  (165.1 cm) Weight: 83 kg (183 lb) IBW/kg (Calculated) : 57 Heparin Dosing Weight: 75 kg  Vital Signs: Temp: 98.5 F (36.9 C) (04/13 0912) Temp Source: Oral (04/13 0912) BP: 178/74 (04/13 0912) Pulse Rate: 94 (04/13 0912)  Labs: Recent Labs    06/28/20 2056 06/29/20 0716 06/29/20 0810 06/29/20 1113  HGB 8.5*  --   --  10.4*  HCT 27.2*  --   --  33.2*  PLT 164  --   --  179  HEPARINUNFRC  --   --  0.99*  --   CREATININE 2.65*  --   --  2.11*  TROPONINIHS 12 12  --   --     Estimated Creatinine Clearance: 25.6 mL/min (A) (by C-G formula based on SCr of 2.11 mg/dL (H)).   Assessment: Patient is a 75 y.o. F with CKD presented to the ED on 4/12 with c/o SOB.  Ddimer was found to be elevated.  Heparin drip started on admission for r/o PE.  LE doppler on 4/13 came back with acute deep vein thrombosis involving the right peroneal veins. V/Q scan results pending.  Today, 06/29/2020: - first heparin level is supra-therapeutic at 0.99 with heparin infusing at 1300 units/hr - per pt's RN, no issues with IV line and no bleeding noted  Goal of Therapy:  Heparin  level 0.3-0.7 units/ml Monitor platelets by anticoagulation protocol: Yes   Plan:  - reduce heparin drip to 1100 units/hr - check 8 hr heparin level - monitor for s/sx bleeding - f/u V/Q scan result  Ilsa Bonello P 06/29/2020,1:29 PM

## 2020-06-29 NOTE — ED Notes (Signed)
Per Md order Pt given 120 mL of orange juice and a pack of peanut butter crackers

## 2020-06-29 NOTE — Progress Notes (Signed)
Bilateral lower extremity venous duplex has been completed. Preliminary results can be found in CV Proc through chart review.  Results were given to the patient's nurse, Jarrett Soho.  06/29/20 11:15 AM Carlos Levering RVT

## 2020-06-30 ENCOUNTER — Inpatient Hospital Stay (HOSPITAL_COMMUNITY): Payer: Medicare Other

## 2020-06-30 DIAGNOSIS — R0602 Shortness of breath: Secondary | ICD-10-CM

## 2020-06-30 DIAGNOSIS — N189 Chronic kidney disease, unspecified: Secondary | ICD-10-CM

## 2020-06-30 DIAGNOSIS — I82451 Acute embolism and thrombosis of right peroneal vein: Principal | ICD-10-CM

## 2020-06-30 DIAGNOSIS — R0600 Dyspnea, unspecified: Secondary | ICD-10-CM

## 2020-06-30 DIAGNOSIS — W19XXXD Unspecified fall, subsequent encounter: Secondary | ICD-10-CM

## 2020-06-30 DIAGNOSIS — M549 Dorsalgia, unspecified: Secondary | ICD-10-CM

## 2020-06-30 DIAGNOSIS — R7989 Other specified abnormal findings of blood chemistry: Secondary | ICD-10-CM

## 2020-06-30 DIAGNOSIS — G8929 Other chronic pain: Secondary | ICD-10-CM

## 2020-06-30 DIAGNOSIS — I152 Hypertension secondary to endocrine disorders: Secondary | ICD-10-CM

## 2020-06-30 DIAGNOSIS — R0609 Other forms of dyspnea: Secondary | ICD-10-CM

## 2020-06-30 DIAGNOSIS — N179 Acute kidney failure, unspecified: Secondary | ICD-10-CM

## 2020-06-30 DIAGNOSIS — E1159 Type 2 diabetes mellitus with other circulatory complications: Secondary | ICD-10-CM

## 2020-06-30 DIAGNOSIS — D631 Anemia in chronic kidney disease: Secondary | ICD-10-CM

## 2020-06-30 DIAGNOSIS — Z9189 Other specified personal risk factors, not elsewhere classified: Secondary | ICD-10-CM

## 2020-06-30 DIAGNOSIS — Z8739 Personal history of other diseases of the musculoskeletal system and connective tissue: Secondary | ICD-10-CM

## 2020-06-30 DIAGNOSIS — R0601 Orthopnea: Secondary | ICD-10-CM

## 2020-06-30 DIAGNOSIS — Y92009 Unspecified place in unspecified non-institutional (private) residence as the place of occurrence of the external cause: Secondary | ICD-10-CM

## 2020-06-30 DIAGNOSIS — Z853 Personal history of malignant neoplasm of breast: Secondary | ICD-10-CM

## 2020-06-30 LAB — CBC WITH DIFFERENTIAL/PLATELET
Abs Immature Granulocytes: 0.2 10*3/uL — ABNORMAL HIGH (ref 0.00–0.07)
Basophils Absolute: 0 10*3/uL (ref 0.0–0.1)
Basophils Relative: 0 %
Eosinophils Absolute: 0.1 10*3/uL (ref 0.0–0.5)
Eosinophils Relative: 1 %
HCT: 31.5 % — ABNORMAL LOW (ref 36.0–46.0)
Hemoglobin: 9.7 g/dL — ABNORMAL LOW (ref 12.0–15.0)
Immature Granulocytes: 2 %
Lymphocytes Relative: 13 %
Lymphs Abs: 1.3 10*3/uL (ref 0.7–4.0)
MCH: 29 pg (ref 26.0–34.0)
MCHC: 30.8 g/dL (ref 30.0–36.0)
MCV: 94.3 fL (ref 80.0–100.0)
Monocytes Absolute: 0.7 10*3/uL (ref 0.1–1.0)
Monocytes Relative: 7 %
Neutro Abs: 7.5 10*3/uL (ref 1.7–7.7)
Neutrophils Relative %: 77 %
Platelets: 163 10*3/uL (ref 150–400)
RBC: 3.34 MIL/uL — ABNORMAL LOW (ref 3.87–5.11)
RDW: 17.7 % — ABNORMAL HIGH (ref 11.5–15.5)
WBC: 9.7 10*3/uL (ref 4.0–10.5)
nRBC: 0.3 % — ABNORMAL HIGH (ref 0.0–0.2)

## 2020-06-30 LAB — RENAL FUNCTION PANEL
Albumin: 3.1 g/dL — ABNORMAL LOW (ref 3.5–5.0)
Anion gap: 7 (ref 5–15)
BUN: 69 mg/dL — ABNORMAL HIGH (ref 8–23)
CO2: 18 mmol/L — ABNORMAL LOW (ref 22–32)
Calcium: 8.2 mg/dL — ABNORMAL LOW (ref 8.9–10.3)
Chloride: 116 mmol/L — ABNORMAL HIGH (ref 98–111)
Creatinine, Ser: 1.95 mg/dL — ABNORMAL HIGH (ref 0.44–1.00)
GFR, Estimated: 27 mL/min — ABNORMAL LOW (ref >60)
Glucose, Bld: 73 mg/dL (ref 70–99)
Phosphorus: 3.4 mg/dL (ref 2.5–4.6)
Potassium: 4.7 mmol/L (ref 3.5–5.1)
Sodium: 141 mmol/L (ref 135–145)

## 2020-06-30 LAB — ECHOCARDIOGRAM COMPLETE
Area-P 1/2: 4.06 cm2
Height: 65 in
S' Lateral: 2.5 cm
Weight: 3135.82 oz

## 2020-06-30 LAB — GLUCOSE, CAPILLARY
Glucose-Capillary: 65 mg/dL — ABNORMAL LOW (ref 70–99)
Glucose-Capillary: 115 mg/dL — ABNORMAL HIGH (ref 70–99)
Glucose-Capillary: 100 mg/dL — ABNORMAL HIGH (ref 70–99)
Glucose-Capillary: 145 mg/dL — ABNORMAL HIGH (ref 70–99)
Glucose-Capillary: 150 mg/dL — ABNORMAL HIGH (ref 70–99)

## 2020-06-30 LAB — HEPARIN LEVEL (UNFRACTIONATED)
Heparin Unfractionated: <0.1 IU/mL — ABNORMAL LOW (ref 0.30–0.70)
Heparin Unfractionated: >1.1 IU/mL — ABNORMAL HIGH (ref 0.30–0.70)

## 2020-06-30 LAB — C-REACTIVE PROTEIN: CRP: 0.5 mg/dL (ref <1.0)

## 2020-06-30 LAB — D-DIMER, QUANTITATIVE: D-Dimer, Quant: 4.33 ug/mL-FEU — ABNORMAL HIGH (ref 0.00–0.50)

## 2020-06-30 LAB — PROCALCITONIN: Procalcitonin: <0.1 ng/mL

## 2020-06-30 LAB — FERRITIN: Ferritin: 148 ng/mL (ref 11–307)

## 2020-06-30 MED ORDER — APIXABAN 5 MG PO TABS: 5.0000 mg | ORAL_TABLET | Freq: Two times a day (BID) | ORAL | Status: DC

## 2020-06-30 MED ORDER — NEBIVOLOL HCL 10 MG PO TABS
10.0000 mg | ORAL_TABLET | Freq: Every day | ORAL | Status: DC
  Administered 2020-06-30 – 2020-07-01 (×2): 10 mg via ORAL
  Filled 2020-06-30 (×2): qty 1

## 2020-06-30 MED ORDER — PREGABALIN 75 MG PO CAPS
75.0000 mg | ORAL_CAPSULE | Freq: Two times a day (BID) | ORAL | Status: DC
  Administered 2020-06-30 – 2020-07-01 (×3): 75 mg via ORAL
  Filled 2020-06-30 (×3): qty 1

## 2020-06-30 MED ORDER — NEBIVOLOL HCL 10 MG PO TABS: 10.0000 mg | ORAL_TABLET | Freq: Every day | ORAL | 0 refills | Status: DC

## 2020-06-30 MED ORDER — ACETAMINOPHEN 500 MG PO TABS
500.0000 mg | ORAL_TABLET | Freq: Three times a day (TID) | ORAL | Status: DC
  Administered 2020-06-30 – 2020-07-01 (×4): 500 mg via ORAL
  Filled 2020-06-30 (×4): qty 1

## 2020-06-30 MED ORDER — APIXABAN (ELIQUIS) VTE STARTER PACK (10MG AND 5MG): ORAL_TABLET | ORAL | 0 refills | Status: DC

## 2020-06-30 MED ORDER — ALLOPURINOL 100 MG PO TABS
50.0000 mg | ORAL_TABLET | ORAL | Status: DC
  Administered 2020-07-01: 50 mg via ORAL
  Filled 2020-06-30: qty 1

## 2020-06-30 MED ORDER — ALLOPURINOL 100 MG PO TABS: 50.0000 mg | ORAL_TABLET | ORAL | 0 refills | Status: DC

## 2020-06-30 MED ORDER — PREGABALIN 50 MG PO CAPS: 50.0000 mg | ORAL_CAPSULE | Freq: Two times a day (BID) | ORAL | 0 refills | Status: DC

## 2020-06-30 MED ORDER — APIXABAN 5 MG PO TABS: 5.0000 mg | ORAL_TABLET | Freq: Two times a day (BID) | ORAL | 2 refills | Status: DC

## 2020-06-30 MED ORDER — HEPARIN BOLUS VIA INFUSION
2000.0000 [IU] | Freq: Once | INTRAVENOUS | Status: AC
  Administered 2020-06-30: 2000 [IU] via INTRAVENOUS
  Filled 2020-06-30: qty 2000

## 2020-06-30 MED ORDER — APIXABAN 5 MG PO TABS
10.0000 mg | ORAL_TABLET | Freq: Two times a day (BID) | ORAL | Status: DC
  Administered 2020-06-30 – 2020-07-01 (×3): 10 mg via ORAL
  Filled 2020-06-30: qty 4
  Filled 2020-06-30 (×2): qty 2

## 2020-06-30 NOTE — Progress Notes (Signed)
Phlebotomist in room requests permission from MD to draw heparin level from right arm, which is a restricted limb. Dr Cyndia Skeeters contacted via secure chat and gave permission to draw from the right arm.

## 2020-06-30 NOTE — Discharge Instructions (Signed)
Information on my medicine - ELIQUIS (apixaban)  Why was Eliquis prescribed for you? Eliquis was prescribed to treat blood clots that may have been found in the veins of your legs (deep vein thrombosis) or in your lungs (pulmonary embolism) and to reduce the risk of them occurring again.  What do You need to know about Eliquis ? The starting dose is 10 mg (two 5 mg tablets) taken TWICE daily for the FIRST SEVEN (7) DAYS, then  on 07/07/2020 the dose is reduced to ONE 5 mg tablet taken TWICE daily.  Eliquis may be taken with or without food.   Try to take the dose about the same time in the morning and in the evening. If you have difficulty swallowing the tablet whole please discuss with your pharmacist how to take the medication safely.  Take Eliquis exactly as prescribed and DO NOT stop taking Eliquis without talking to the doctor who prescribed the medication.  Stopping may increase your risk of developing a new blood clot.  Refill your prescription before you run out.  After discharge, you should have regular check-up appointments with your healthcare provider that is prescribing your Eliquis.    What do you do if you miss a dose? If a dose of ELIQUIS is not taken at the scheduled time, take it as soon as possible on the same day and twice-daily administration should be resumed. The dose should not be doubled to make up for a missed dose.  Important Safety Information A possible side effect of Eliquis is bleeding. You should call your healthcare provider right away if you experience any of the following: ? Bleeding from an injury or your nose that does not stop. ? Unusual colored urine (red or dark brown) or unusual colored stools (red or black). ? Unusual bruising for unknown reasons. ? A serious fall or if you hit your head (even if there is no bleeding).  Some medicines may interact with Eliquis and might increase your risk of bleeding or clotting while on Eliquis. To help  avoid this, consult your healthcare provider or pharmacist prior to using any new prescription or non-prescription medications, including herbals, vitamins, non-steroidal anti-inflammatory drugs (NSAIDs) and supplements.  This website has more information on Eliquis (apixaban): http://www.eliquis.com/eliquis/home

## 2020-06-30 NOTE — Progress Notes (Signed)
ANTICOAGULATION CONSULT NOTE - Follow Up Consult  Pharmacy Consult for heparin Indication: acute DVT; r/o PE  Allergies  Allergen Reactions  . Percocet [Oxycodone-Acetaminophen] Other (See Comments)    headaches  . Diazepam Other (See Comments)    REACTION: Agitation  . Haloperidol Lactate Nausea And Vomiting  . Lorazepam Nausea Only  . Morphine Sulfate Other (See Comments)    REACTION: Agitation  . Propoxyphene Hcl Nausea And Vomiting  . Tramadol Hcl Nausea And Vomiting  . Anesthetics, Halogenated     Per patient's daughter, she can not tolerate anesthetics.   . Etomidate     Heart stopped during anesthesia  . Fentanyl     Heart stopped during anesthesia   . Tramadol Hcl     Other reaction(s): nausea & vomitin  . Versed [Midazolam]     Heart stopped during anesthesia    Patient Measurements: Height: 5' 5"  (165.1 cm) Weight: 83 kg (183 lb) IBW/kg (Calculated) : 57 Heparin Dosing Weight: 75 kg  Vital Signs: Temp: 98.3 F (36.8 C) (04/13 2105) Temp Source: Oral (04/13 2105) BP: 140/65 (04/13 2105) Pulse Rate: 89 (04/13 2105)  Labs: Recent Labs    06/28/20 2056 06/29/20 0716 06/29/20 0810 06/29/20 1113 06/29/20 2216 06/30/20 0040  HGB 8.5*  --   --  10.4*  --  9.7*  HCT 27.2*  --   --  33.2*  --  31.5*  PLT 164  --   --  179  --  163  HEPARINUNFRC  --   --  0.99*  --  <0.10*  --   CREATININE 2.65*  --   --  2.11*  --   --   TROPONINIHS 12 12  --   --   --   --     Estimated Creatinine Clearance: 25.6 mL/min (A) (by C-G formula based on SCr of 2.11 mg/dL (H)).   Assessment: Patient is a 75 y.o. F with CKD presented to the ED on 4/12 with c/o SOB.  Ddimer was found to be elevated.  Heparin drip started on admission for r/o PE.  LE doppler on 4/13 came back with acute deep vein thrombosis involving the right peroneal veins. V/Q scan negative for PE.  Today, 06/30/2020: - Heparin level <0.1- now undetectable with heparin infusing at lowered rate of 1100  units/hr - per pt's RN, no bleeding noted.  She reports no interruptions of infusion, however there is some concern that IV site is infiltrated so IV site will be changed.   Goal of Therapy:  Heparin level 0.3-0.7 units/ml Monitor platelets by anticoagulation protocol: Yes   Plan:  - Change heparin IV infusion site.  - Re-bolus heparin 2000 units IV x1 then increase heparin infusion rate slightly to 1200 units/hr - check 8 hr heparin level - monitor for s/sx bleeding - f/u long-term anticoagulation plans  Netta Cedars PharmD, BCPS 06/30/2020,2:17 AM

## 2020-06-30 NOTE — Evaluation (Signed)
Physical Therapy One Time Evaluation Patient Details Name: [name] MRN: [id number] DOB: April 04, 1945 Today's Date: 06/30/2020   History of Present Illness  75 year old female presenting with shortness of breath and left-sided pleuritic chest pain posteriorly.  Pt found to have elevated D-dimer but VQ scan with low probability.  LE [place] with right peroneal vein DVT.  PMH of CKD-4, DM-2, CAD, left macular CVA, Br cancer in remission, anemia of renal disease, gout, esophagitis, left renal vein thrombosis not on AC and chronic pain  Clinical Impression  Patient evaluated by Physical Therapy with no further acute PT needs identified. All education has been completed and the patient has no further questions.  Pt ambulated in hallway and denies SOB.  SPO2 96-100% on room air.  Pt hopeful for d/c home tomorrow.  See below for any follow-up Physical Therapy or equipment needs. PT is signing off. Thank you for this referral.      Follow Up Recommendations No PT follow up    Equipment Recommendations  None recommended by PT    Recommendations for Other Services       Precautions / Restrictions Precautions Precautions: None      Mobility  Bed Mobility Overal bed mobility: Modified Independent                  Transfers Overall transfer level: Needs assistance Equipment used: Rolling walker (2 wheeled) Transfers: Sit to/from Stand Sit to Stand: Supervision            Ambulation/Gait Ambulation/Gait assistance: Min guard;Supervision Gait Distance (Feet): 200 Feet Assistive device: Rolling walker (2 wheeled) Gait Pattern/deviations: Step-through pattern;Decreased stride length     General Gait Details: SpO2 96-100% on room air, pt reports mild right calf pain with ambulating so encouraged UE weight bearing on RW, no unsteadiness or LOB observed  Stairs            Wheelchair Mobility    Modified Rankin (Stroke Patients Only)       Balance Overall  balance assessment:  (pt denies falls, uses rollator at baseline)                                           Pertinent Vitals/Pain Pain Assessment: Faces Faces Pain Scale: Hurts a little bit Pain Location: right calf Pain Descriptors / Indicators: Sore Pain Intervention(s): Repositioned;Monitored during session    Home Living Family/patient expects to be discharged to:: Private residence Living Arrangements: Spouse/significant other Available Help at Discharge: Available 24 hours/day Type of Home: Apartment Home Access: Level entry     Home Layout: One level Home Equipment: Environmental consultant - 4 wheels      Prior Function Level of Independence: Independent with assistive device(s)   Gait / Transfers Assistance Needed: uses rollator           Hand Dominance        Extremity/Trunk Assessment   Upper Extremity Assessment Upper Extremity Assessment: Overall WFL for tasks assessed    Lower Extremity Assessment Lower Extremity Assessment: Overall WFL for tasks assessed    Cervical / Trunk Assessment Cervical / Trunk Assessment: Normal  Communication   Communication: No difficulties  Cognition Arousal/Alertness: Awake/alert Behavior During Therapy: WFL for tasks assessed/performed Overall Cognitive Status: Within Functional Limits for tasks assessed  General Comments      Exercises     Assessment/Plan    PT Assessment Patent does not need any further PT services  PT Problem List         PT Treatment Interventions      PT Goals (Current goals can be found in the Care Plan section)  Acute Rehab PT Goals Patient Stated Goal: home asap PT Goal Formulation: All assessment and education complete, DC therapy    Frequency     Barriers to discharge        Co-evaluation               AM-PAC PT "6 Clicks" Mobility  Outcome Measure Help needed turning from your back to your side while  in a flat bed without using bedrails?: None Help needed moving from lying on your back to sitting on the side of a flat bed without using bedrails?: None Help needed moving to and from a bed to a chair (including a wheelchair)?: A Little Help needed standing up from a chair using your arms (e.g., wheelchair or bedside chair)?: A Little Help needed to walk in hospital room?: A Little Help needed climbing 3-5 steps with a railing? : A Little 6 Click Score: 20    End of Session Equipment Utilized During Treatment: Gait belt Activity Tolerance: Patient tolerated treatment well Patient left: in bed;with call bell/phone within reach;with family/visitor present   PT Visit Diagnosis: Difficulty in walking, not elsewhere classified (R26.2)    Time: 2481-8590 PT Time Calculation (min) (ACUTE ONLY): 11 min   Charges:   PT Evaluation $PT Eval Low Complexity: 1 Low        Kati PT, DPT Acute Rehabilitation Services Pager: [phone] Office: [phone]  York Ram E 06/30/2020, 4:11 PM

## 2020-06-30 NOTE — Progress Notes (Signed)
  Echocardiogram 2D Echocardiogram has been performed.  [name] 06/30/2020, 1:00 PM

## 2020-06-30 NOTE — Progress Notes (Signed)
PROGRESS NOTE  [name] CNO:[id number] DOB: 03-Oct-1945   PCP: Seward Carol, MD  Patient is from: Home  DOA: 06/28/2020 LOS: 1  Chief complaints: Shortness of breath  Brief Narrative / Interim history: 75 year old F with PMH of CKD-4, DM-2, CAD, left macular CVA, Br cancer in remission, anemia of renal disease, gout, esophagitis, left renal vein thrombosis not on AC and chronic pain presenting with shortness of breath and left-sided pleuritic chest pain posteriorly.  In ED, she had elevated D-dimer up to 4.98.  There was concern about PE but VQ scan with low probability.  LE [place] with right peroneal vein DVT.  She was started on IV heparin, and admitted.  Patient also has had an AKI, orthopnea and lower extremity edema.  However, BNP and CXR without acute finding.  She also had two falls within the last two weeks.   Subjective: Seen and examined earlier this morning.  No major events overnight of this morning.  Reports some shortness of breath with exertion especially in the morning hours, and left-sided mid back pain with deep breathing.  He denies heartburn or reflux.  She denies cough, nausea, vomiting or abdominal pain.  She has some pain in her right leg that she attributes to the blood clots.  She denies fever or chills.  Objective: Vitals:   06/29/20 1336 06/29/20 2105 06/30/20 0500 06/30/20 0531  BP: 135/84 140/65  (!) 146/68  Pulse: 88 89  87  Resp: 18 20  19   Temp: 98.5 F (36.9 C) 98.3 F (36.8 C)  98.7 F (37.1 C)  TempSrc: Oral Oral    SpO2: 100% 100%  97%  Weight:   88.9 kg   Height:        Intake/Output Summary (Last 24 hours) at 06/30/2020 1140 Last data filed at 06/30/2020 0304 Gross per 24 hour  Intake 589.71 ml  Output --  Net 589.71 ml   Filed Weights   06/28/20 1928 06/30/20 0500  Weight: 83 kg 88.9 kg    Examination:  GENERAL: No apparent distress.  Nontoxic. HEENT: MMM.  Vision and hearing grossly intact.  NECK: Supple.  No apparent JVD.   RESP:  No IWOB.  Fair aeration bilaterally. CVS:  RRR. Heart sounds normal.  ABD/GI/GU: BS+. Abd soft, NTND.  MSK/EXT:  Moves extremities. No apparent deformity.  Trace edema bilaterally. SKIN: no apparent skin lesion or wound NEURO: Awake, alert and oriented appropriately.  No apparent focal neuro deficit. PSYCH: Calm. Normal affect.   Procedures:  None  Microbiology summarized: [id number]-[id number] and influenza PCR nonreactive.  Assessment & Plan: Shortness of breath/dyspnea on exertion-I doubt this is related to PE as V/Q does not show large PE.  Could have some element of CHF although BNP and CXR seems to be within normal.  Low suspicion for infectious process without fever, leukocytosis or cough.  Her procalcitonin is within normal arguing against bacterial pneumonia.  EKG and serial troponin negative for ACS.  She has no anginal chest pain either. -Treat DVT and possible peripheral PE as below -Check TTE -Ambulatory saturation assessment -Change her Coreg to Bystolic for cardiac selectivity -PT/OT eval  Acute RLE based DVT: LEU [place] positive for peroneal DVT.  She also has history of renal vein thrombosis in the past.  VQ scan is negative for large central PE but cannot exclude peripheral PEs.  I had extensive discussion with patient's daughter who is a Designer, jewellery about risk and benefits of treating diastolic DVT.  She is at  risk for extension of DVT if untreated.  She is also at risk for bleeding given previous history of GI bleed/esophagitis, age, renal function, diabetes, anemia and remote history of paroxysmal infarct.  Eventually, we have agreed to treat at least for 3 months with his p.o. Eliquis. -Consulted pharmacy to dose Eliquis -Discontinue Mobic  AKI on CKD-4/azotemia: Baseline creatinine seems to range from 1.6-1.8.  She is followed by [facility] Hollie Salk [facility]. She was recently started on Mobic for pain.  She is also on torsemide.  Renal function improving after holding Mobic and  torsemide. Recent Labs    07/21/19 0809 12/21/19 0845 05/23/20 0829 06/28/20 2056 06/29/20 1113 06/30/20 0040  BUN 33* 39* 30* 84* 71* 69*  CREATININE 1.62* 2.51* 1.84* 2.65* 2.11* 1.95*  -I recommended avoiding any NSAIDs including Mobic moving forward. -Continue holding torsemide -Continue monitoring  Anemia of renal disease: H&H is stable.  Followed by oncology and nephrology.  She is on Retacrit and therapy). Recent Labs    01/18/20 0837 02/01/20 0830 02/29/20 0818 03/28/20 0804 04/25/20 0754 05/23/20 0829 06/20/20 1519 06/28/20 2056 06/29/20 1113 06/30/20 0040  HGB 10.7* 11.0* 10.0* 10.4* 9.2* 10.1* 9.5* 8.5* 10.4* 9.7*  -Continue p.o. iron   History of CAD s/p remote PCI and DES to LAD.  Followed by cardiology.  No anginal symptoms.  Not on DAPT probably due to GI bleed -Continue home statin, Imdur, beta-blocker  Controlled IDDM-2: A1c 6.6%.  She is on Tradjenta and Lantus at home. Recent Labs  Lab 06/29/20 1713 06/29/20 2107 06/30/20 0745 06/30/20 0843 06/30/20 1124  GLUCAP 125* 158* 65* 115* 100*  -Continue SSI-renal -Continue CBG monitoring  History of breast cancer in remission:  -Outpatient follow-up with oncologist  Chronic pain-she is followed at [facility] had multiple imaging of her back. I have no access to those.  She seems to be stable from pain standpoint. -Recommend discontinuing gabapentin -Continue Lyrica 75 mg twice daily -Continue home p.o. Dilaudid -May consider scheduling Tylenol every 8 hours -Discontinue Mobic  Accidental fall at home: Patient had 2 falls about 2 weeks prior. She denies prodromes leading to this.  She was recently started on Lyrica and Dilaudid.  She is also on gabapentin. -Adjusted pain medications as above -PT/OT eval  History of gout: On Uloric. -Changed Uloric therapy allopurinol 50 mg every other day given blackbox warning with Uloric  At risk for polypharmacy -I had a lengthy discussion with  patient's daughter who is a Designer, jewellery about medication adjustments as indicated above.  Class I obesity Body mass index is 32.61 kg/m.         DVT prophylaxis:  On Eliquis for DVT  Code Status: Full code Family Communication: Updated patient's daughter at bedside Level of care: Telemetry Status is: Inpatient  Remains inpatient appropriate because:Ongoing diagnostic testing needed not appropriate for outpatient work up and Unsafe d/c plan   Dispo: The patient is from: Home              Anticipated d/c is to: Home on 4/15              Patient currently is not medically stable to d/c.   Difficult to place patient No       Consultants:  None   Sch Meds:  Scheduled Meds: . amLODipine  10 mg Oral Daily  . atorvastatin  40 mg Oral Daily  . colestipol  2 g Oral QHS  . febuxostat  40 mg Oral Daily  .  Ferrous Fumarate  106 mg of iron Oral Daily  . hydrALAZINE  100 mg Oral TID  . insulin aspart  0-6 Units Subcutaneous TID WC  . loratadine  10 mg Oral Daily  . melatonin  10 mg Oral QHS  . nebivolol  10 mg Oral Daily  . pantoprazole  40 mg Oral QAC breakfast  . pregabalin  75 mg Oral BID   Continuous Infusions: . heparin 1,200 Units/hr (06/30/20 0303)   PRN Meds:.acetaminophen **OR** acetaminophen, albuterol, HYDROmorphone, loperamide, nitroGLYCERIN, ondansetron **OR** ondansetron (ZOFRAN) IV  Antimicrobials: Anti-infectives (From admission, onward)   None       I have personally reviewed the following labs and images: CBC: Recent Labs  Lab 06/28/20 2056 06/29/20 1113 06/30/20 0040  WBC 10.5 11.7* 9.7  NEUTROABS 8.1* 8.7* 7.5  HGB 8.5* 10.4* 9.7*  HCT 27.2* 33.2* 31.5*  MCV 90.1 91.0 94.3  PLT 164 179 163   BMP &GFR Recent Labs  Lab 06/28/20 2056 06/29/20 1113 06/30/20 0040  NA 138 143 141  K 5.5* 4.9 4.7  CL 109 115* 116*  CO2 20* 19* 18*  GLUCOSE 120* 119* 73  BUN 84* 71* 69*  CREATININE 2.65* 2.11* 1.95*  CALCIUM 8.7* 8.9 8.2*   PHOS  --  3.2 3.4   Estimated Creatinine Clearance: 28.7 mL/min (A) (by C-G formula based on SCr of 1.95 mg/dL (H)). Liver & Pancreas: Recent Labs  Lab 06/29/20 1113 06/30/20 0040  ALBUMIN 3.2* 3.1*   No results for input(s): LIPASE, AMYLASE in the last 168 hours. No results for input(s): AMMONIA in the last 168 hours. Diabetic: Recent Labs    06/29/20 1113  HGBA1C 6.6*   Recent Labs  Lab 06/29/20 1713 06/29/20 2107 06/30/20 0745 06/30/20 0843 06/30/20 1124  GLUCAP 125* 158* 65* 115* 100*   Cardiac Enzymes: No results for input(s): CKTOTAL, CKMB, CKMBINDEX, TROPONINI in the last 168 hours. No results for input(s): PROBNP in the last 8760 hours. Coagulation Profile: No results for input(s): INR, PROTIME in the last 168 hours. Thyroid Function Tests: No results for input(s): TSH, T4TOTAL, FREET4, T3FREE, THYROIDAB in the last 72 hours. Lipid Profile: No results for input(s): CHOL, HDL, LDLCALC, TRIG, CHOLHDL, LDLDIRECT in the last 72 hours. Anemia Panel: Recent Labs    06/30/20 0040  FERRITIN 148   Urine analysis:    Component Value Date/Time   COLORURINE YELLOW 06/28/2020 2353   APPEARANCEUR CLEAR 06/28/2020 2353   APPEARANCEUR Clear 08/22/2018 1620   LABSPEC 1.013 06/28/2020 2353   LABSPEC 1.020 03/03/2014 1341   PHURINE 5.0 06/28/2020 2353   GLUCOSEU NEGATIVE 06/28/2020 2353   GLUCOSEU Negative 03/03/2014 1341   HGBUR NEGATIVE 06/28/2020 2353   BILIRUBINUR NEGATIVE 06/28/2020 2353   BILIRUBINUR Negative 08/22/2018 1620   BILIRUBINUR Negative 03/03/2014 1341   KETONESUR NEGATIVE 06/28/2020 2353   PROTEINUR TRACE (A) 06/28/2020 2353   UROBILINOGEN 0.2 03/03/2014 1341   NITRITE NEGATIVE 06/28/2020 2353   LEUKOCYTESUR NEGATIVE 06/28/2020 2353   LEUKOCYTESUR Negative 03/03/2014 1341   Sepsis Labs: Invalid input(s): PROCALCITONIN, [place]  Microbiology: Recent Results (from the past 240 hour(s))  Resp Panel by RT-PCR (Flu A&B, Covid)  Nasopharyngeal Swab     Status: None   Collection Time: 06/28/20 12:34 AM   Specimen: Nasopharyngeal Swab; Nasopharyngeal(NP) swabs in vial transport medium  Result Value Ref Range Status   SARS Coronavirus 2 by RT PCR NEGATIVE NEGATIVE Final    Comment: (NOTE) SARS-CoV-2 target nucleic acids are NOT DETECTED.  The SARS-CoV-2 RNA  is generally detectable in upper respiratory specimens during the acute phase of infection. The lowest concentration of SARS-CoV-2 viral copies this assay can detect is 138 copies/mL. A negative result does not preclude SARS-Cov-2 infection and should not be used as the sole basis for treatment or other patient management decisions. A negative result may occur with  improper specimen collection/handling, submission of specimen other than nasopharyngeal swab, presence of viral mutation(s) within the areas targeted by this assay, and inadequate number of viral copies(<138 copies/mL). A negative result must be combined with clinical observations, patient history, and epidemiological information. The expected result is Negative.  Fact Sheet for Patients:  EntrepreneurPulse.com.au  Fact Sheet for Healthcare Providers:  IncredibleEmployment.be  This test is no t yet approved or cleared by the [place] FDA and  has been authorized for detection and/or diagnosis of SARS-CoV-2 by FDA under an Emergency Use Authorization (EUA). This EUA will remain  in effect (meaning this test can be used) for the duration of the COVID-19 declaration under Section 564(b)(1) of the Act, 21 U.S.C.section 360bbb-3(b)(1), unless the authorization is terminated  or revoked sooner.       Influenza A by PCR NEGATIVE NEGATIVE Final   Influenza B by PCR NEGATIVE NEGATIVE Final    Comment: (NOTE) The Xpert Xpress SARS-CoV-2/FLU/RSV plus assay is intended as an aid in the diagnosis of influenza from Nasopharyngeal swab specimens and should not be  used as a sole basis for treatment. Nasal washings and aspirates are unacceptable for Xpert Xpress SARS-CoV-2/FLU/RSV testing.  Fact Sheet for Patients: EntrepreneurPulse.com.au  Fact Sheet for Healthcare Providers: IncredibleEmployment.be  This test is not yet approved or cleared by the [place] FDA and has been authorized for detection and/or diagnosis of SARS-CoV-2 by FDA under an Emergency Use Authorization (EUA). This EUA will remain in effect (meaning this test can be used) for the duration of the COVID-19 declaration under Section 564(b)(1) of the Act, 21 U.S.C. section 360bbb-3(b)(1), unless the authorization is terminated or revoked.  Performed at [facility]     Radiology Studies: NM Pulmonary Perfusion  Result Date: 06/29/2020 CLINICAL DATA:  Shortness of breath with positive D-dimer study EXAM: NUCLEAR MEDICINE PERFUSION LUNG SCAN TECHNIQUE: Perfusion images were obtained in multiple projections after intravenous injection of radiopharmaceutical. Views: Anterior, posterior, left lateral, right lateral, RPO, LPO, RAO, LAO. RADIOPHARMACEUTICALS:  4.0 mCi Tc-46mMAA IV COMPARISON:  Chest radiograph June 28, 2020 FINDINGS: Radiotracer uptake is homogeneous and symmetric bilaterally. No perfusion defects. IMPRESSION: No perfusion defects.  No findings indicative of pulmonary embolus. Electronically Signed   By: WLowella GripIII M.D.   On: 06/29/2020 15:02     Ramiya Delahunty T. [place] If 7PM-7AM, please contact night-coverage [email or web address] 06/30/2020, 11:40 AM

## 2020-07-01 DIAGNOSIS — W19XXXA Unspecified fall, initial encounter: Secondary | ICD-10-CM

## 2020-07-01 DIAGNOSIS — Z794 Long term (current) use of insulin: Secondary | ICD-10-CM

## 2020-07-01 DIAGNOSIS — I272 Pulmonary hypertension, unspecified: Secondary | ICD-10-CM

## 2020-07-01 DIAGNOSIS — M5417 Radiculopathy, lumbosacral region: Secondary | ICD-10-CM

## 2020-07-01 DIAGNOSIS — N184 Chronic kidney disease, stage 4 (severe): Secondary | ICD-10-CM

## 2020-07-01 DIAGNOSIS — E1122 Type 2 diabetes mellitus with diabetic chronic kidney disease: Secondary | ICD-10-CM

## 2020-07-01 LAB — RENAL FUNCTION PANEL
Albumin: 3.1 g/dL — ABNORMAL LOW (ref 3.5–5.0)
Anion gap: 5 (ref 5–15)
BUN: 55 mg/dL — ABNORMAL HIGH (ref 8–23)
CO2: 20 mmol/L — ABNORMAL LOW (ref 22–32)
Calcium: 8.7 mg/dL — ABNORMAL LOW (ref 8.9–10.3)
Chloride: 115 mmol/L — ABNORMAL HIGH (ref 98–111)
Creatinine, Ser: 1.78 mg/dL — ABNORMAL HIGH (ref 0.44–1.00)
GFR, Estimated: 30 mL/min — ABNORMAL LOW (ref >60)
Glucose, Bld: 84 mg/dL (ref 70–99)
Phosphorus: 3.5 mg/dL (ref 2.5–4.6)
Potassium: 4.9 mmol/L (ref 3.5–5.1)
Sodium: 140 mmol/L (ref 135–145)

## 2020-07-01 LAB — CBC
HCT: 32.2 % — ABNORMAL LOW (ref 36.0–46.0)
Hemoglobin: 9.8 g/dL — ABNORMAL LOW (ref 12.0–15.0)
MCH: 28.2 pg (ref 26.0–34.0)
MCHC: 30.4 g/dL (ref 30.0–36.0)
MCV: 92.8 fL (ref 80.0–100.0)
Platelets: 160 10*3/uL (ref 150–400)
RBC: 3.47 MIL/uL — ABNORMAL LOW (ref 3.87–5.11)
RDW: 17.8 % — ABNORMAL HIGH (ref 11.5–15.5)
WBC: 8.5 10*3/uL (ref 4.0–10.5)
nRBC: 0.2 % (ref 0.0–0.2)

## 2020-07-01 LAB — MAGNESIUM: Magnesium: 1.1 mg/dL — ABNORMAL LOW (ref 1.7–2.4)

## 2020-07-01 LAB — GLUCOSE, CAPILLARY
Glucose-Capillary: 76 mg/dL (ref 70–99)
Glucose-Capillary: 119 mg/dL — ABNORMAL HIGH (ref 70–99)

## 2020-07-01 MED ORDER — HYDROMORPHONE HCL 2 MG PO TABS: 2.0000 mg | ORAL_TABLET | Freq: Four times a day (QID) | ORAL | 0 refills | Status: AC | PRN

## 2020-07-01 MED ORDER — MAGNESIUM SULFATE 4 GM/100ML IV SOLN
4.0000 g | Freq: Once | INTRAVENOUS | Status: AC
  Administered 2020-07-01: 4 g via INTRAVENOUS
  Filled 2020-07-01: qty 100

## 2020-07-01 MED ORDER — TORSEMIDE 20 MG PO TABS: 20.0000 mg | ORAL_TABLET | Freq: Every day | ORAL | Status: DC

## 2020-07-01 NOTE — Discharge Summary (Signed)
Physician Discharge Summary  [name] OMV:[id number] DOB: 01/30/1946 DOA: 06/28/2020  PCP: Seward Carol, MD  Admit date: 06/28/2020 Discharge date: 07/01/2020  Admitted From: Home Disposition: Home  Recommendations for Outpatient Follow-up:  1. Follow ups as below. 2. Please obtain CBC/BMP/Mag at follow up 3. Please follow up on the following pending results: None  Home Health: None required Equipment/Devices: None required  Discharge Condition: Stable CODE STATUS: Full code    Hospital Course: 75 year old F with PMH of CKD-4, DM-2, CAD, left macular CVA, Br cancer in remission, anemia of renal disease, gout, esophagitis, left renal vein thrombosis not on AC and chronic pain presenting with shortness of breath and left-sided pleuritic chest pain posteriorly.  In ED, she had elevated D-dimer up to 4.98.  There was concern about PE but VQ scan with low probability.  LE [place] with right peroneal vein DVT.  She was started on IV heparin, and admitted.    On further interview, patient has had chronic orthopnea and lower extremity edema.  However, BNP and CXR without acute finding.  TTE with LVEF of 55 to 60%, G1-DD and RVSP of 41.6 mmHg.  It is possible that her pulmonary hypertension could contribute to her respiratory distress.  Patient has no fever or leukocytosis to suggest infectious process.  Procalcitonin was negative.  Patient was also on high-dose Coreg which could potentially cause bronchospasm.  This was changed to Bystolic.  She was also on multiple sedating medications which could affect her breathing.  We have made adjustment to these medications after discussion with patient's daughter who is a Designer, jewellery.  Patient also had an AKI likely from the Demadex and Mobic she takes at home.  These were held on admission.  Renal function improved.   In regards to DVT, risk and benefits of treatment were discussed with patient and patient's daughter, and she was  transitioned to p.o. Eliquis starter pack, and discharged on this.  Patient was advised to avoid NSAIDs.  Mobic discontinued on discharge.  Patient was evaluated by therapy and no needles identified.  She was also ambulated on room air and maintain saturation above 98%.  See individual problem list below for more on hospital course.  Discharge Diagnoses:  Shortness of breath/dyspnea on exertion-I doubt this is related to PE as V/Q does not show large PE.  Could be related to her pulmonary hypertension although mild. Low suspicion for infectious process without fever, leukocytosis or cough.  Her procalcitonin is within normal arguing against bacterial pneumonia.  EKG and serial troponin negative for ACS.  She has no anginal chest pain either.  Patient's symptoms improved.  She maintained saturation of 96 to 100% on RA. -Treat DVT and possible peripheral PE as below -Changed her Coreg to Bystolic for cardiac selectivity  Acute RLE based DVT: LEU [place] positive for peroneal DVT.  She also has history of renal vein thrombosis in the past.  VQ scan is negative for large central PE but cannot exclude peripheral PEs.  I had extensive discussion with patient's daughter who is a Designer, jewellery about risk and benefits of treating diastolic DVT.  She is at risk for extension of DVT if untreated.  She is also at risk for bleeding given previous history of GI bleed/esophagitis, age, renal function, diabetes, anemia and remote history of paroxysmal infarct.  Eventually, we have agreed to treat at least for 3 months with his p.o. Eliquis. -Started on a starter pack Eliquis -Discontinued Mobic -Advised to avoid NSAID.  AKI on CKD-4/azotemia: Baseline creatinine seems to range from 1.6-1.8.  She is followed by Dr. Hollie Salk at [facility]. She was recently started on Mobic for pain.  She is also on torsemide.  Renal function improving after holding Mobic and torsemide. Recent Labs    07/21/19 0809 12/21/19 0845  05/23/20 0829 06/28/20 2056 06/29/20 1113 06/30/20 0040 07/01/20 0527  BUN 33* 39* 30* 84* 71* 69* 55*  CREATININE 1.62* 2.51* 1.84* 2.65* 2.11* 1.95* 1.78*  -Recommended holding torsemide for the next few days and restarting as needed -We discontinued Mobic -Recheck renal function in 1 week  Anemia of renal disease: H&H is stable.  Followed by oncology and nephrology.  She is on Retacrit and therapy). Recent Labs    02/01/20 0830 02/29/20 0818 03/28/20 0804 04/25/20 0754 05/23/20 0829 06/20/20 1519 06/28/20 2056 06/29/20 1113 06/30/20 0040 07/01/20 0527  HGB 11.0* 10.0* 10.4* 9.2* 10.1* 9.5* 8.5* 10.4* 9.7* 9.8*  -Continue p.o. iron and Retacrit -Check CBC in 1 week   History of CAD s/p remote PCI and DES to LAD.  Followed by cardiology.  No anginal symptoms.  Not on DAPT probably due to GI bleed -Continue home statin, Imdur -Changed Coreg to Bystolic  Controlled IDDM-2: A1c 6.6%.  She is on Tradjenta and Lantus at home. Recent Labs  Lab 06/30/20 1124 06/30/20 1707 06/30/20 2132 07/01/20 0730 07/01/20 1145  GLUCAP 100* 145* 150* 76 119*  -Continue home medications.  History of breast cancer in remission:  -Outpatient follow-up with oncologist  Lumbosacral radiculopathy-she is followed at [facility] had MRI recently.. I have no access to those.   She has ongoing right-sided radiculopathy. -Continue Lyrica 50 mg twice daily.  She may use 100 mg at night if needed -Continue home p.o. Dilaudid 2 mg every 6 as needed severe pain, gave Rx for #20.  Discussed risk and benefits.  Narcotic database reviewed. -May consider scheduling Tylenol every 8 hours -Discontinued Mobic -She could benefit from physical therapy. -She has upcoming appointment with orthopedic surgery in 6 days.  Accidental fall at home: Patient had 2 falls about 2 weeks prior. She denies prodromes leading to this.  She was recently started on Lyrica and Dilaudid.  She is also on gabapentin.   Evaluated by therapy and no need was identified. -Adjusted pain medications as above  History of gout: On Uloric. -Changed Uloric therapy allopurinol 50 mg every other day given blackbox warning with Uloric  At risk for polypharmacy -I had a lengthy discussion with patient's daughter who is a Designer, jewellery about medication adjustments as indicated above.  Hypomagnesemia: Mg 1.1. -Replenished with IV magnesium sulfate 4 g prior to discharge.  Class I obesity Body mass index is 30.23 kg/m.  -Encourage lifestyle change to lose weight.          Discharge Exam: Vitals:   07/01/20 0522 07/01/20 0754  BP: (!) 146/85 126/64  Pulse: 85 83  Resp: 16 16  Temp: 98.7 F (37.1 C) 98.6 F (37 C)  SpO2: 99% 100%    GENERAL: No apparent distress.  Nontoxic. HEENT: MMM.  Vision and hearing grossly intact.  NECK: Supple.  No apparent JVD.  RESP: On RA.  No IWOB.  Fair aeration bilaterally. CVS:  RRR. Heart sounds normal.  ABD/GI/GU: Bowel sounds present. Soft. Non tender.  MSK/EXT:  Moves extremities. No apparent deformity.  Trace edema bilaterally SKIN: no apparent skin lesion or wound NEURO: Awake, alert and oriented appropriately.  No apparent focal neuro deficit. PSYCH: Calm.  Normal affect.  Discharge Instructions  Discharge Instructions    (HEART FAILURE PATIENTS) Call MD:  Anytime you have any of the following symptoms: 1) 3 pound weight gain in 24 hours or 5 pounds in 1 week 2) shortness of breath, with or without a dry hacking cough 3) swelling in the hands, feet or stomach 4) if you have to sleep on extra pillows at night in order to breathe.   Complete by: As directed    Call MD for:  difficulty breathing, headache or visual disturbances   Complete by: As directed    Call MD for:  extreme fatigue   Complete by: As directed    Call MD for:  persistant dizziness or light-headedness   Complete by: As directed    Call MD for:  persistant nausea and vomiting    Complete by: As directed    Call MD for:  severe uncontrolled pain   Complete by: As directed    Call MD for:  temperature >100.4   Complete by: As directed    Diet - low sodium heart healthy   Complete by: As directed    Diet Carb Modified   Complete by: As directed    Discharge instructions   Complete by: As directed    It has been a pleasure taking care of you!  You were hospitalized due to shortness of breath and fall.  Your shortness of breath could be from medications and to some extent from pulmonary hypertension.  You also have blood clot in your right leg but doubt this is the cause of you shortness of breath.  We made adjustment to your home medications to help with your breathing and kidney function.  We have started you on blood thinner (Eliquis) for DVT (blood clot in your leg).  We strongly recommend you avoid any over-the-counter pain medication other than plain Tylenol while taking Eliquis.  We have also stopped you Mobic (meloxicam) as these could increase your risk of kidney injury and bleeding especially with the blood thinner.    Your right leg pain is likely radiculopathic pain (nerve pain) from your back.  You may follow-up with your orthopedic surgeon as previously planned or sooner if needed.  Please follow-up with your primary care doctor in 1 to 2 weeks to have your kidney number and blood level rechecked.   Follow-up with your kidney doctor, cardiologist and hematologist as previously planned or sooner if needed.   Take care,   Increase activity slowly   Complete by: As directed      Allergies as of 07/01/2020      Reactions   Percocet [oxycodone-acetaminophen] Other (See Comments)   headaches   Diazepam Other (See Comments)   REACTION: Agitation   Haloperidol Lactate Nausea And Vomiting   Lorazepam Nausea Only   Morphine Sulfate Other (See Comments)   REACTION: Agitation   Propoxyphene Hcl Nausea And Vomiting   Tramadol Hcl Nausea And Vomiting    Anesthetics, Halogenated    Per patient's daughter, she can not tolerate anesthetics.    Etomidate    Heart stopped during anesthesia   Fentanyl    Heart stopped during anesthesia   Tramadol Hcl    Other reaction(s): nausea & vomitin   Versed [midazolam]    Heart stopped during anesthesia      Medication List    STOP taking these medications   carvedilol 25 MG tablet Commonly known as: COREG   gabapentin 300 MG capsule Commonly known as:  NEURONTIN   meloxicam 15 MG tablet Commonly known as: MOBIC   Uloric 40 MG tablet Generic drug: febuxostat     TAKE these medications   allopurinol 100 MG tablet Commonly known as: ZYLOPRIM Take 0.5 tablets (50 mg total) by mouth every other day.   amLODipine 10 MG tablet Commonly known as: NORVASC Take 1 tablet (10 mg total) by mouth daily.   Apixaban Starter Pack (29m and 557m Commonly known as: ELIQUIS STARTER PACK Take as directed on package: start with two-66m36mablets twice daily for 7 days. On day 8, switch to one-66mg53mblet twice daily.   apixaban 5 MG Tabs tablet Commonly known as: ELIQUIS Take 1 tablet (5 mg total) by mouth 2 (two) times daily. Start after you finish the starter pack Start taking on: Aug 01, 2020   atorvastatin 40 MG tablet Commonly known as: LIPITOR Take 40 mg by mouth daily.   B-12 COMPLIANCE INJECTION IJ Inject 1 vial as directed every 30 (thirty) days.   budesonide 3 MG 24 hr capsule Commonly known as: ENTOCORT EC Take 1 capsule by mouth once daily   colestipol 1 g tablet Commonly known as: COLESTID Take 2 g by mouth at bedtime.   ferrous fumarate 325 (106 Fe) MG Tabs tablet Commonly known as: HEMOCYTE - 106 mg FE Take 325 mg of iron by mouth daily.   hydrALAZINE 100 MG tablet Commonly known as: APRESOLINE TAKE 1 TABLET BY MOUTH 3  TIMES DAILY   HYDROmorphone 2 MG tablet Commonly known as: Dilaudid Take 1 tablet (2 mg total) by mouth every 6 (six) hours as needed for up to 5 days  for severe pain.   isosorbide mononitrate 30 MG 24 hr tablet Commonly known as: IMDUR Take 1/2 (one-half) tablet by mouth once daily   Lantus SoloStar 100 UNIT/ML Solostar Pen Generic drug: insulin glargine Inject 8 Units into the skin daily.   linagliptin 5 MG Tabs tablet Commonly known as: TRADJENTA Take 5 mg by mouth daily.   melatonin 5 MG Tabs Take 10 mg by mouth at bedtime.   nebivolol 10 MG tablet Commonly known as: BYSTOLIC Take 1 tablet (10 mg total) by mouth daily.   nitroGLYCERIN 0.4 MG SL tablet Commonly known as: NITROSTAT DISSOLVE 1 TABLET UNDER THE TONGUE EVERY 5 MINUTES AS  NEEDED FOR CHEST PAIN. MAX  OF 3 TABLETS IN 15 MINUTES. CALL 911 IF PAIN PERSISTS. What changed: See the new instructions.   pantoprazole 40 MG tablet Commonly known as: PROTONIX Take 1 tablet (40 mg total) by mouth daily before breakfast.   pregabalin 50 MG capsule Commonly known as: LYRICA Take 1 capsule (50 mg total) by mouth 2 (two) times daily. What changed:   medication strength  how much to take   RETACRIT IJ Inject 1 vial as directed every 30 (thirty) days.   torsemide 20 MG tablet Commonly known as: DEMADEX Take 1 tablet (20 mg total) by mouth daily. 20mg33mly alternating with 40mg 37mt taking on: July 05, 2020 What changed:   how much to take  These instructions start on July 05, 2020. If you are unsure what to do until then, ask your doctor or other care provider.   Vitamin D (Ergocalciferol) 1.25 MG (50000 UNIT) Caps capsule Commonly known as: DRISDOL Take 1 capsule (50,000 Units total) by mouth every 7 (seven) days.       Consultations:  None  Procedures/Studies: 2D Echo on 06/30/2020 1. Left ventricular ejection fraction, by estimation, is 55  to 60%. The  left ventricle has normal function. The left ventricle has no regional  wall motion abnormalities. There is mild left ventricular hypertrophy.  Left ventricular diastolic parameters  are  consistent with Grade I diastolic dysfunction (impaired relaxation).  2. Right ventricular systolic function is normal. The right ventricular  size is normal. There is mildly elevated pulmonary artery systolic  pressure. The estimated right ventricular systolic pressure is 93.7 mmHg.  3. Left atrial size was mildly dilated.  4. The mitral valve is normal in structure. Mild mitral valve  regurgitation. No evidence of mitral stenosis.  5. The aortic valve is tricuspid. Aortic valve regurgitation is not  visualized. No aortic stenosis is present.  6. The inferior vena cava is dilated in size with >50% respiratory  variability, suggesting right atrial pressure of 8 mmHg.    NM Pulmonary Perfusion  Result Date: 06/29/2020 CLINICAL DATA:  Shortness of breath with positive D-dimer study EXAM: NUCLEAR MEDICINE PERFUSION LUNG SCAN TECHNIQUE: Perfusion images were obtained in multiple projections after intravenous injection of radiopharmaceutical. Views: Anterior, posterior, left lateral, right lateral, RPO, LPO, RAO, LAO. RADIOPHARMACEUTICALS:  4.0 mCi Tc-33mMAA IV COMPARISON:  Chest radiograph June 28, 2020 FINDINGS: Radiotracer uptake is homogeneous and symmetric bilaterally. No perfusion defects. IMPRESSION: No perfusion defects.  No findings indicative of pulmonary embolus. Electronically Signed   By: WLowella GripIII M.D.   On: 06/29/2020 15:02   DG Chest Port 1 View  Result Date: 06/28/2020 CLINICAL DATA:  Shortness of breath. EXAM: PORTABLE CHEST 1 VIEW COMPARISON:  May 23, 2019 FINDINGS: There is mild to moderate severity enlargement of the cardiac silhouette. Both lungs are clear. Radiopaque surgical clips are seen overlying the lateral aspect of the right chest wall and right upper quadrant. Degenerative changes seen throughout the thoracic spine. IMPRESSION: 1. Mild to moderate severity cardiomegaly without acute or active cardiopulmonary disease. Electronically Signed   By:  TVirgina NorfolkM.D.   On: 06/28/2020 20:07   ECHOCARDIOGRAM COMPLETE  Result Date: 06/30/2020    ECHOCARDIOGRAM REPORT   Patient Name:   Declyn JMartiniqueSTARKES Date of Exam: 06/30/2020 Medical Rec #:  [id number]         Height:       65.0 in Accession #:    [id number]        Weight:       196.0 lb Date of Birth:  403/18/1947         BSA:          1.961 m Patient Age:    89years           BP:           146/68 mmHg Patient Gender: F                  HR:           87 bpm. Exam Location:  Inpatient Procedure: 2D Echo, Cardiac Doppler and Color Doppler Indications:    Dyspnea  History:        Patient has prior history of Echocardiogram examinations, most                 recent 09/04/2018. CHF, CAD, Signs/Symptoms:Dyspnea; Risk                 Factors:Hypertension, Diabetes and Dyslipidemia.  Sonographer:    BDustin FlockReferring Phys: [id number] T Sheridyn Canino IMPRESSIONS  1. Left ventricular ejection fraction, by estimation, is 55  to 60%. The left ventricle has normal function. The left ventricle has no regional wall motion abnormalities. There is mild left ventricular hypertrophy. Left ventricular diastolic parameters are consistent with Grade I diastolic dysfunction (impaired relaxation).  2. Right ventricular systolic function is normal. The right ventricular size is normal. There is mildly elevated pulmonary artery systolic pressure. The estimated right ventricular systolic pressure is 65.0 mmHg.  3. Left atrial size was mildly dilated.  4. The mitral valve is normal in structure. Mild mitral valve regurgitation. No evidence of mitral stenosis.  5. The aortic valve is tricuspid. Aortic valve regurgitation is not visualized. No aortic stenosis is present.  6. The inferior vena cava is dilated in size with >50% respiratory variability, suggesting right atrial pressure of 8 mmHg. FINDINGS  Left Ventricle: Left ventricular ejection fraction, by estimation, is 55 to 60%. The left ventricle has normal function. The  left ventricle has no regional wall motion abnormalities. The left ventricular internal cavity size was normal in size. There is  mild left ventricular hypertrophy. Left ventricular diastolic parameters are consistent with Grade I diastolic dysfunction (impaired relaxation). Right Ventricle: The right ventricular size is normal. No increase in right ventricular wall thickness. Right ventricular systolic function is normal. There is mildly elevated pulmonary artery systolic pressure. The tricuspid regurgitant velocity is 2.90  m/s, and with an assumed right atrial pressure of 8 mmHg, the estimated right ventricular systolic pressure is 35.4 mmHg. Left Atrium: Left atrial size was mildly dilated. Right Atrium: Right atrial size was normal in size. Pericardium: Trivial pericardial effusion is present. Mitral Valve: The mitral valve is normal in structure. Mild mitral valve regurgitation. No evidence of mitral valve stenosis. Tricuspid Valve: The tricuspid valve is normal in structure. Tricuspid valve regurgitation is trivial. Aortic Valve: The aortic valve is tricuspid. Aortic valve regurgitation is not visualized. No aortic stenosis is present. Pulmonic Valve: The pulmonic valve was normal in structure. Pulmonic valve regurgitation is not visualized. Aorta: The aortic root is normal in size and structure. Venous: The inferior vena cava is dilated in size with greater than 50% respiratory variability, suggesting right atrial pressure of 8 mmHg. IAS/Shunts: No atrial level shunt detected by color flow Doppler.  LEFT VENTRICLE PLAX 2D LVIDd:         4.20 cm  Diastology LVIDs:         2.50 cm  LV e' medial:    7.07 cm/s LV PW:         1.30 cm  LV E/e' medial:  13.0 LV IVS:        1.30 cm  LV e' lateral:   8.05 cm/s LVOT diam:     2.20 cm  LV E/e' lateral: 11.4 LV SV:         73 LV SV Index:   37 LVOT Area:     3.80 cm  RIGHT VENTRICLE RV Basal diam:  2.40 cm RV S prime:     13.70 cm/s TAPSE (M-mode): 3.1 cm LEFT ATRIUM              Index       RIGHT ATRIUM           Index LA diam:        3.70 cm 1.89 cm/m  RA Area:     17.90 cm LA Vol (A2C):   34.9 ml 17.80 ml/m RA Volume:   46.90 ml  23.91 ml/m LA Vol (A4C):   59.8 ml 30.49 ml/m LA Biplane  Vol: 46.9 ml 23.91 ml/m  AORTIC VALVE LVOT Vmax:   90.10 cm/s LVOT Vmean:  57.800 cm/s LVOT VTI:    0.191 m  AORTA Ao Root diam: 2.90 cm MITRAL VALVE               TRICUSPID VALVE MV Area (PHT): 4.06 cm    TR Peak grad:   33.6 mmHg MV Decel Time: 187 msec    TR Vmax:        290.00 cm/s MV E velocity: 92.10 cm/s MV A velocity: 95.10 cm/s  SHUNTS MV E/A ratio:  0.97        Systemic VTI:  0.19 m                            Systemic Diam: 2.20 cm Loralie Champagne MD Electronically signed by Loralie Champagne MD Signature Date/Time: 06/30/2020/5:41:59 PM    Final    DG Hip Unilat W or Wo Pelvis 2-3 Views Right  Result Date: 06/14/2020 CLINICAL DATA:  RIGHT hip pain post fall 2 weeks ago, pain down RIGHT leg EXAM: DG HIP (WITH OR WITHOUT PELVIS) 2-3V RIGHT COMPARISON:  None FINDINGS: Osseous demineralization. Joint spaces preserved. No acute fracture, dislocation, or bone destruction. Scattered atherosclerotic calcifications. IMPRESSION: No acute osseous abnormalities. Electronically Signed   By: Lavonia Dana M.D.   On: 06/14/2020 12:09   VAS [place] LOWER EXTREMITY VENOUS (DVT)  Result Date: 06/29/2020  Lower Venous DVT Study Indications: Elevated Ddimer.  Risk Factors: None identified. Comparison Study: No prior studies. Performing Technologist: Oliver Hum RVT  Examination Guidelines: A complete evaluation includes B-mode imaging, spectral Doppler, color Doppler, and power Doppler as needed of all accessible portions of each vessel. Bilateral testing is considered an integral part of a complete examination. Limited examinations for reoccurring indications may be performed as noted. The reflux portion of the exam is performed with the patient in reverse Trendelenburg.   +---------+---------------+---------+-----------+----------+--------------+ RIGHT    CompressibilityPhasicitySpontaneityPropertiesThrombus Aging +---------+---------------+---------+-----------+----------+--------------+ CFV      Full           Yes      Yes                                 +---------+---------------+---------+-----------+----------+--------------+ SFJ      Full                                                        +---------+---------------+---------+-----------+----------+--------------+ FV Prox  Full                                                        +---------+---------------+---------+-----------+----------+--------------+ FV Mid   Full                                                        +---------+---------------+---------+-----------+----------+--------------+ FV DistalFull                                                        +---------+---------------+---------+-----------+----------+--------------+  PFV      Full                                                        +---------+---------------+---------+-----------+----------+--------------+ POP      Full           Yes      Yes                                 +---------+---------------+---------+-----------+----------+--------------+ PTV      Full                                                        +---------+---------------+---------+-----------+----------+--------------+ PERO     Partial                                      Acute          +---------+---------------+---------+-----------+----------+--------------+   +---------+---------------+---------+-----------+----------+--------------+ LEFT     CompressibilityPhasicitySpontaneityPropertiesThrombus Aging +---------+---------------+---------+-----------+----------+--------------+ CFV      Full           Yes      Yes                                  +---------+---------------+---------+-----------+----------+--------------+ SFJ      Full                                                        +---------+---------------+---------+-----------+----------+--------------+ FV Prox  Full                                                        +---------+---------------+---------+-----------+----------+--------------+ FV Mid   Full                                                        +---------+---------------+---------+-----------+----------+--------------+ FV DistalFull                                                        +---------+---------------+---------+-----------+----------+--------------+ PFV      Full                                                        +---------+---------------+---------+-----------+----------+--------------+  POP      Full           Yes      Yes                                 +---------+---------------+---------+-----------+----------+--------------+ PTV      Full                                                        +---------+---------------+---------+-----------+----------+--------------+ PERO     Full                                                        +---------+---------------+---------+-----------+----------+--------------+     Summary: RIGHT: - Findings consistent with acute deep vein thrombosis involving the right peroneal veins. - No cystic structure found in the popliteal fossa.  LEFT: - There is no evidence of deep vein thrombosis in the lower extremity.  - No cystic structure found in the popliteal fossa.  *See table(s) above for measurements and observations. Electronically signed by Monica Martinez MD on 06/29/2020 at 2:18:02 PM.    Final         The results of significant diagnostics from this hospitalization (including imaging, microbiology, ancillary and laboratory) are listed below for reference.     Microbiology: Recent Results (from the past  240 hour(s))  Resp Panel by RT-PCR (Flu A&B, Covid) Nasopharyngeal Swab     Status: None   Collection Time: 06/28/20 12:34 AM   Specimen: Nasopharyngeal Swab; Nasopharyngeal(NP) swabs in vial transport medium  Result Value Ref Range Status   SARS Coronavirus 2 by RT PCR NEGATIVE NEGATIVE Final    Comment: (NOTE) SARS-CoV-2 target nucleic acids are NOT DETECTED.  The SARS-CoV-2 RNA is generally detectable in upper respiratory specimens during the acute phase of infection. The lowest concentration of SARS-CoV-2 viral copies this assay can detect is 138 copies/mL. A negative result does not preclude SARS-Cov-2 infection and should not be used as the sole basis for treatment or other patient management decisions. A negative result may occur with  improper specimen collection/handling, submission of specimen other than nasopharyngeal swab, presence of viral mutation(s) within the areas targeted by this assay, and inadequate number of viral copies(<138 copies/mL). A negative result must be combined with clinical observations, patient history, and epidemiological information. The expected result is Negative.  Fact Sheet for Patients:  EntrepreneurPulse.com.au  Fact Sheet for Healthcare Providers:  IncredibleEmployment.be  This test is no t yet approved or cleared by the [place] FDA and  has been authorized for detection and/or diagnosis of SARS-CoV-2 by FDA under an Emergency Use Authorization (EUA). This EUA will remain  in effect (meaning this test can be used) for the duration of the COVID-19 declaration under Section 564(b)(1) of the Act, 21 U.S.C.section 360bbb-3(b)(1), unless the authorization is terminated  or revoked sooner.       Influenza A by PCR NEGATIVE NEGATIVE Final   Influenza B by PCR NEGATIVE NEGATIVE Final    Comment: (NOTE) The Xpert Xpress SARS-CoV-2/FLU/RSV plus assay is intended as an aid in the diagnosis of influenza  from Nasopharyngeal swab specimens and should not be used as a sole basis for treatment. Nasal washings and aspirates are unacceptable for Xpert Xpress SARS-CoV-2/FLU/RSV testing.  Fact Sheet for Patients: EntrepreneurPulse.com.au  Fact Sheet for Healthcare Providers: IncredibleEmployment.be  This test is not yet approved or cleared by the [place] FDA and has been authorized for detection and/or diagnosis of SARS-CoV-2 by FDA under an Emergency Use Authorization (EUA). This EUA will remain in effect (meaning this test can be used) for the duration of the COVID-19 declaration under Section 564(b)(1) of the Act, 21 U.S.C. section 360bbb-3(b)(1), unless the authorization is terminated or revoked.  Performed at [facility]      Labs:  CBC: Recent Labs  Lab 06/28/20 2056 06/29/20 1113 06/30/20 0040 07/01/20 0527  WBC 10.5 11.7* 9.7 8.5  NEUTROABS 8.1* 8.7* 7.5  --   HGB 8.5* 10.4* 9.7* 9.8*  HCT 27.2* 33.2* 31.5* 32.2*  MCV 90.1 91.0 94.3 92.8  PLT 164 179 163 160   BMP &GFR Recent Labs  Lab 06/28/20 2056 06/29/20 1113 06/30/20 0040 07/01/20 0527  NA 138 143 141 140  K 5.5* 4.9 4.7 4.9  CL 109 115* 116* 115*  CO2 20* 19* 18* 20*  GLUCOSE 120* 119* 73 84  BUN 84* 71* 69* 55*  CREATININE 2.65* 2.11* 1.95* 1.78*  CALCIUM 8.7* 8.9 8.2* 8.7*  MG  --   --   --  1.1*  PHOS  --  3.2 3.4 3.5   Estimated Creatinine Clearance: 29.9 mL/min (A) (by C-G formula based on SCr of 1.78 mg/dL (H)). Liver & Pancreas: Recent Labs  Lab 06/29/20 1113 06/30/20 0040 07/01/20 0527  ALBUMIN 3.2* 3.1* 3.1*   No results for input(s): LIPASE, AMYLASE in the last 168 hours. No results for input(s): AMMONIA in the last 168 hours. Diabetic: Recent Labs    06/29/20 1113  HGBA1C 6.6*   Recent Labs  Lab 06/30/20 1124 06/30/20 1707 06/30/20 2132 07/01/20 0730 07/01/20 1145  GLUCAP 100* 145* 150* 76 119*   Cardiac  Enzymes: No results for input(s): CKTOTAL, CKMB, CKMBINDEX, TROPONINI in the last 168 hours. No results for input(s): PROBNP in the last 8760 hours. Coagulation Profile: No results for input(s): INR, PROTIME in the last 168 hours. Thyroid Function Tests: No results for input(s): TSH, T4TOTAL, FREET4, T3FREE, THYROIDAB in the last 72 hours. Lipid Profile: No results for input(s): CHOL, HDL, LDLCALC, TRIG, CHOLHDL, LDLDIRECT in the last 72 hours. Anemia Panel: Recent Labs    06/30/20 0040  FERRITIN 148   Urine analysis:    Component Value Date/Time   COLORURINE YELLOW 06/28/2020 2353   APPEARANCEUR CLEAR 06/28/2020 2353   APPEARANCEUR Clear 08/22/2018 1620   LABSPEC 1.013 06/28/2020 2353   LABSPEC 1.020 03/03/2014 1341   PHURINE 5.0 06/28/2020 2353   GLUCOSEU NEGATIVE 06/28/2020 2353   GLUCOSEU Negative 03/03/2014 1341   HGBUR NEGATIVE 06/28/2020 2353   BILIRUBINUR NEGATIVE 06/28/2020 2353   BILIRUBINUR Negative 08/22/2018 1620   BILIRUBINUR Negative 03/03/2014 1341   KETONESUR NEGATIVE 06/28/2020 2353   PROTEINUR TRACE (A) 06/28/2020 2353   UROBILINOGEN 0.2 03/03/2014 1341   NITRITE NEGATIVE 06/28/2020 2353   LEUKOCYTESUR NEGATIVE 06/28/2020 2353   LEUKOCYTESUR Negative 03/03/2014 1341   Sepsis Labs: Invalid input(s): PROCALCITONIN, LACTICIDVEN   Time coordinating discharge: 45 minutes  SIGNED:  Mercy Riding, MD  [facility] 07/01/2020, 2:35 PM  If 7PM-7AM, please contact night-coverage [email or web address]

## 2020-07-01 NOTE — Plan of Care (Signed)

## 2020-07-01 NOTE — Care Management Important Message (Signed)
Medicare IM printed to give to the patient, by Caitland Porchia 

## 2020-07-01 NOTE — Progress Notes (Incomplete)
Pt. Discharged via wheelchair accompanied by daughter and staff. Pt. Is alert and oriented, no distress. Discharge instruction discussed with family, patient verbalized understanding/. All patient belongings are with the family.

## 2020-07-01 NOTE — Evaluation (Signed)
Occupational Therapy Evaluation Patient Details Name: [name] MRN: [id number] DOB: [birth year] Today's Date: 07/01/2020    History of Present Illness 75 year old female presenting with shortness of breath and left-sided pleuritic chest pain posteriorly.  Pt found to have elevated D-dimer but VQ scan with low probability.  LE [place] with right peroneal vein DVT.  PMH of CKD-4, DM-2, CAD, left macular CVA, Br cancer in remission, anemia of renal disease, gout, esophagitis, left renal vein thrombosis not on AC and chronic pain   Clinical Impression   Patient lives with spouse in a single level apartment, is modified independent at baseline with ambulation using rollator and self care. Patient demonstrates all self care tasks and functional transfers without any physical assistance. Patient reports she is hopeful she is going home today, no needs/concerns regarding ADLs at home. Acute OT will sign off. Please re-consult if new needs arise.    Follow Up Recommendations  No OT follow up    Equipment Recommendations  None recommended by OT       Precautions / Restrictions Precautions Precautions: None Restrictions Weight Bearing Restrictions: No      Mobility Bed Mobility Overal bed mobility: Modified Independent                  Transfers Overall transfer level: Modified independent Equipment used: Rolling walker (2 wheeled) Transfers: Sit to/from Stand Sit to Stand: Modified independent (Device/Increase time)              Balance Overall balance assessment: Modified Independent                                         ADL either performed or assessed with clinical judgement   ADL Overall ADL's : Modified independent                                       General ADL Comments: patient perform LB dressing, functional ambulation with FWW, toilet transfer and sink side g/h without any physical assistance                   Pertinent Vitals/Pain Pain Assessment: Faces Faces Pain Scale: Hurts little more Pain Location: right calf Pain Descriptors / Indicators: Sore Pain Intervention(s): Monitored during session     Hand Dominance  (did not specify)   Extremity/Trunk Assessment Upper Extremity Assessment Upper Extremity Assessment: Overall WFL for tasks assessed   Lower Extremity Assessment Lower Extremity Assessment: Defer to PT evaluation   Cervical / Trunk Assessment Cervical / Trunk Assessment: Normal   Communication Communication Communication: No difficulties   Cognition Arousal/Alertness: Awake/alert Behavior During Therapy: WFL for tasks assessed/performed Overall Cognitive Status: Within Functional Limits for tasks assessed                                                Home Living Family/patient expects to be discharged to:: Private residence Living Arrangements: Spouse/significant other Available Help at Discharge: Available 24 hours/day Type of Home: Apartment Home Access: Level entry     Home Layout: One level     Bathroom Shower/Tub: Occupational psychologist: Standard     Home Equipment:  Walker - 4 wheels          Prior Functioning/Environment Level of Independence: Independent with assistive device(s)  Gait / Transfers Assistance Needed: uses rollator              OT Problem List: Pain         OT Goals(Current goals can be found in the care plan section) Acute Rehab OT Goals Patient Stated Goal: home asap OT Goal Formulation: All assessment and education complete, DC therapy      AM-PAC OT "6 Clicks" Daily Activity     Outcome Measure Help from another person eating meals?: None Help from another person taking care of personal grooming?: None Help from another person toileting, which includes using toliet, bedpan, or urinal?: None Help from another person bathing (including washing, rinsing, drying)?: None Help from  another person to put on and taking off regular upper body clothing?: None Help from another person to put on and taking off regular lower body clothing?: None 6 Click Score: 24   End of Session Equipment Utilized During Treatment: Rolling walker Nurse Communication: Mobility status  Activity Tolerance: Patient tolerated treatment well Patient left: in chair;with call bell/phone within reach  OT Visit Diagnosis: Pain Pain - Right/Left: Right Pain - part of body: Leg                Time: 0938-1829 OT Time Calculation (min): 11 min Charges:  OT General Charges $OT Visit: 1 Visit OT Evaluation $OT Eval Low Complexity: 1 Low  Delbert Phenix OT OT pager: Marceline 07/01/2020, 10:31 AM

## 2020-07-06 DIAGNOSIS — M545 Low back pain, unspecified: Secondary | ICD-10-CM | POA: Diagnosis not present

## 2020-07-07 DIAGNOSIS — M5416 Radiculopathy, lumbar region: Secondary | ICD-10-CM | POA: Diagnosis not present

## 2020-07-08 DIAGNOSIS — M4800 Spinal stenosis, site unspecified: Secondary | ICD-10-CM | POA: Diagnosis not present

## 2020-07-08 DIAGNOSIS — I82452 Acute embolism and thrombosis of left peroneal vein: Secondary | ICD-10-CM | POA: Diagnosis not present

## 2020-07-08 DIAGNOSIS — N184 Chronic kidney disease, stage 4 (severe): Secondary | ICD-10-CM | POA: Diagnosis not present

## 2020-07-11 ENCOUNTER — Ambulatory Visit
Admission: RE | Admit: 2020-07-11 | Discharge: 2020-07-11 | Disposition: A | Discharge status: Home / Self Care | Payer: Medicare Other | Source: Ambulatory Visit | Attending: Nurse Practitioner | Admitting: Nurse Practitioner

## 2020-07-11 ENCOUNTER — Other Ambulatory Visit: Payer: Self-pay

## 2020-07-11 ENCOUNTER — Telehealth: Payer: Self-pay | Admitting: Cardiovascular Disease

## 2020-07-11 ENCOUNTER — Encounter: Payer: Self-pay | Admitting: Hematology

## 2020-07-11 ENCOUNTER — Ambulatory Visit
Admission: RE | Admit: 2020-07-11 | Discharge: 2020-07-11 | Disposition: A | Discharge status: Home / Self Care | Payer: Medicare Other | Source: Ambulatory Visit | Attending: Hematology | Admitting: Hematology

## 2020-07-11 ENCOUNTER — Other Ambulatory Visit: Payer: Self-pay | Admitting: Nurse Practitioner

## 2020-07-11 DIAGNOSIS — R600 Localized edema: Secondary | ICD-10-CM

## 2020-07-11 DIAGNOSIS — N184 Chronic kidney disease, stage 4 (severe): Secondary | ICD-10-CM | POA: Diagnosis not present

## 2020-07-11 DIAGNOSIS — I10 Essential (primary) hypertension: Secondary | ICD-10-CM

## 2020-07-11 DIAGNOSIS — C50311 Malignant neoplasm of lower-inner quadrant of right female breast: Secondary | ICD-10-CM

## 2020-07-11 DIAGNOSIS — I82452 Acute embolism and thrombosis of left peroneal vein: Secondary | ICD-10-CM | POA: Diagnosis not present

## 2020-07-11 DIAGNOSIS — R928 Other abnormal and inconclusive findings on diagnostic imaging of breast: Secondary | ICD-10-CM | POA: Diagnosis not present

## 2020-07-11 DIAGNOSIS — Z170 Estrogen receptor positive status [ER+]: Secondary | ICD-10-CM

## 2020-07-11 DIAGNOSIS — N6311 Unspecified lump in the right breast, upper outer quadrant: Secondary | ICD-10-CM | POA: Diagnosis not present

## 2020-07-11 DIAGNOSIS — I2510 Atherosclerotic heart disease of native coronary artery without angina pectoris: Secondary | ICD-10-CM

## 2020-07-11 DIAGNOSIS — N6313 Unspecified lump in the right breast, lower outer quadrant: Secondary | ICD-10-CM | POA: Diagnosis not present

## 2020-07-11 DIAGNOSIS — Z853 Personal history of malignant neoplasm of breast: Secondary | ICD-10-CM | POA: Diagnosis not present

## 2020-07-11 MED ORDER — TORSEMIDE 20 MG PO TABS: 40.0000 mg | ORAL_TABLET | Freq: Every day | ORAL | 3 refills | Status: DC

## 2020-07-11 NOTE — Telephone Encounter (Signed)
Per Dr. Johnsie Cancel: She had a DVT and they cut back on her demedex for elevated Cr which probably explains her edema and weight gain. Ok to go back to demedex 40 mg daily and f/u her renal function with primary in a week with BMET  Left message for patient to call back regarding advice from Dr. Johnsie Cancel

## 2020-07-11 NOTE — Telephone Encounter (Signed)
Received call directly from operator from patient and [facility] nurse who is also on the line. Patient states she is having increased leg swelling and SOB since her d/c from [facility] on 4/15. Her weight has increased also over the last 4 days.  Thurs 4/21 177 Sat 4/23 181 Sun 4/24 170 Today 4/25 @ 0645 184 lb, then @ 1130 187 lb She takes torsemide 20 mg daily. She has no explanation for the significant difference in weight on Sunday.  She saw PCP 4 days ago and states legs are more swollen since she saw him. He did not make any medication changes. Denies pnd, orthopnea. She notes improvement in leg swelling when she wakes up in the morning and says she is wearing compression stockings.  She takes amlodipine 10 mg daily, states leg swelling occurred prior to starting amlodipine. Last echo 4/14 showed LVEF 55-60%, G1DD. She did not give a clear explanation on dietary sodium but says she cooks at home. She has been less active recently due to a fall. Advised her that I will forward message to Dr. Johnsie Cancel for advice and in the meantime she should elevate legs above heart level. She is aware that our office will call back with Dr. Kyla Balzarine advice. She thanked me for the call.

## 2020-07-11 NOTE — Telephone Encounter (Signed)
Pt c/o swelling: STAT is pt has developed SOB within 24 hours  1) How much weight have you gained and in what time span? 5 lbs  2) If swelling, where is the swelling located? Legs and feet   3) Are you currently taking a fluid pill? Yes   4) Are you currently SOB? yes  5) Do you have a log of your daily weights (if so, list)? Today:184 Sunday:170 Saturday:181 Thrusday:177  6) Have you gained 3 pounds in a day or 5 pounds in a week? Yes 5 lbs in a week   7) Have you traveled recently? No   Pt c/o Shortness Of Breath: STAT if SOB developed within the last 24 hours or pt is noticeably SOB on the phone  1. Are you currently SOB (can you hear that pt is SOB on the phone)? Yes  2. How long have you been experiencing SOB?   3. Are you SOB when sitting or when up moving around? Moving around   4. Are you currently experiencing any other symptoms? None

## 2020-07-11 NOTE — Telephone Encounter (Signed)
Spoke with patient and reviewed Dr. Kyla Balzarine advice with her. She verbalized understanding and agreement to increase torsemide to 40 mg daily. She will come in for lab work on 5/3 because she has other appointments on 5/2. I advised her to call back if her weight does not decrease, her SOB worsens, or her edema worsens. She verbalized understanding and thanked me for the call.

## 2020-07-12 ENCOUNTER — Ambulatory Visit: Payer: Medicare Other | Attending: Orthopedic Surgery | Admitting: Physical Therapy

## 2020-07-12 ENCOUNTER — Other Ambulatory Visit: Payer: Self-pay | Admitting: Cardiology

## 2020-07-12 ENCOUNTER — Encounter: Payer: Self-pay | Admitting: Physical Therapy

## 2020-07-12 DIAGNOSIS — M5441 Lumbago with sciatica, right side: Secondary | ICD-10-CM

## 2020-07-12 DIAGNOSIS — M6281 Muscle weakness (generalized): Secondary | ICD-10-CM | POA: Diagnosis not present

## 2020-07-12 DIAGNOSIS — M25511 Pain in right shoulder: Secondary | ICD-10-CM | POA: Diagnosis not present

## 2020-07-12 DIAGNOSIS — R262 Difficulty in walking, not elsewhere classified: Secondary | ICD-10-CM | POA: Diagnosis not present

## 2020-07-12 DIAGNOSIS — R2681 Unsteadiness on feet: Secondary | ICD-10-CM | POA: Diagnosis not present

## 2020-07-12 DIAGNOSIS — M19012 Primary osteoarthritis, left shoulder: Secondary | ICD-10-CM | POA: Diagnosis not present

## 2020-07-12 DIAGNOSIS — M25512 Pain in left shoulder: Secondary | ICD-10-CM | POA: Diagnosis not present

## 2020-07-12 DIAGNOSIS — Z043 Encounter for examination and observation following other accident: Secondary | ICD-10-CM | POA: Diagnosis not present

## 2020-07-12 DIAGNOSIS — R252 Cramp and spasm: Secondary | ICD-10-CM | POA: Diagnosis not present

## 2020-07-12 NOTE — Therapy (Signed)
[facility] [address], [place], [place], [address] Phone: [phone]   Fax:  [phone]  Physical Therapy Evaluation  Patient Details  Name: [name] MRN: [id number] Date of Birth: 25-Feb-1946 Referring Provider (PT): Marchia Bond, MD   Encounter Date: 07/12/2020   PT End of Session - 07/12/20 1652    Visit Number 1    Date for PT Re-Evaluation 09/09/20    Authorization Type [employer] Medicare    Progress Note Due on Visit 10    PT Start Time 1404    PT Stop Time 1447    PT Time Calculation (min) 43 min    Activity Tolerance Patient tolerated treatment well;No increased pain    Behavior During Therapy WFL for tasks assessed/performed           Past Medical History:  Diagnosis Date  . Abdominal discomfort    Chronic N/V/D. Presumptive dx Crohn's dx per elevated p ANCA. Failed Entocort and Pentasa. Sep 2003 - ileocolectomy c anastomosis per Dr Deon Pilling 2/2 adhesions - path was hegative for Crohns. EGD, Sm bowel follow through (11/03), and an eteroclysis (10/03) were unrevealing. Cuases hypomag and hypocalcemia.  . Adnexal mass 8/03   s/p lap BSO (R ovarian fibroma) & lysis of adhesions  . Allergy    Seasonal  . Anemia    Multifactorial. Baseline HgB 10-11 ish. B12 def - 150 in 3/10. Fe Def - ferritin 35 3/10. Both are being repleted.  . Breast cancer ([place]) 03/16/13   right, 5 o'clock  . CAD (coronary artery disease) 1996   1996 - PTCA and angioplasty diagonal branch. 2000 - Rotoblator & angiopllasty of diagonal. 2006 - subendocardial AMI, DES to proximal LAD.Marland Kitchen Also had 90% stenosis in distal apical LAD. EF 55 with apical hypokinesis. Indefinite ASA and Plavix.  . CHF (congestive heart failure) ([place])   . Chronic kidney disease    Chronic renal insuff baseline Cr 1.2 - 1.4 ish.  . Chronic pain    CT 10/10 = Spinal stenosis L2 - S1.  . Diabetes mellitus    Insulin dependent  . Gout   . Hx of radiation  therapy 06/02/13- 07/16/13   right rbeast 4500 cGy 25 sessions, right breast boost 1600 cGy in 8 sessions  . Hyperlipidemia    Managed with both a statin and Welchol. Welchol stopped 2014 2/2 cost and started on fenofibrate   . Hypertension    2006 B renal arteries patent. 2003 MRA - no RAS. 2003 pheo W/U Dr Hassell Done reportedly negative.  . Hypoxia 07/23/2017  . Lupus ([place])   . Lymphedema of breast   . Personal history of radiation therapy 2015  . RBBB   . Renal vein thrombosis ([place])   . SBO (small bowel obstruction) ([place]) 09/17/2017  . Secondary hyperparathyroidism ([place])   . Stroke [facility])    Incidental finding MRI 2002 L lacunar infarct  . Vitamin B12 deficiency   . Vitamin D deficiency   . Wears dentures    top    Past Surgical History:  Procedure Laterality Date  . ABDOMINAL HYSTERECTOMY    . BILATERAL SALPINGOOPHORECTOMY  8/03   Lap BSO (R ovarian fibroma) and adhesion lysis  . BOWEL RESECTION  2003   ileocolectomy with anastomosis 2/2 adhesions  . BREAST BIOPSY Right 2015  . BREAST BIOPSY Right 07/2014  . BREAST LUMPECTOMY Right 04/22/2013  . BREAST LUMPECTOMY WITH NEEDLE LOCALIZATION AND AXILLARY SENTINEL LYMPH NODE BX Right 04/22/2013  Procedure: BREAST LUMPECTOMY WITH NEEDLE LOCALIZATION AND AXILLARY SENTINEL LYMPH NODE BX;  Surgeon: Stark Klein, MD;  Location: [place];  Service: General;  Laterality: Right;  . CARDIAC CATHETERIZATION     2 stents  . CHOLECYSTECTOMY    . COLONOSCOPY    . HEMICOLECTOMY     R sided hemicolectomy  . HERNIA REPAIR     Ventral hernia repair  . PTCA  4/06    There were no vitals filed for this visit.    Subjective Assessment - 07/12/20 1408    Subjective Patient reports approx 5 week history of low back pain following a fall. Patient reports that she was attempting to ascend porch while carrying groceries. She states that she fell directly onto Rt side. She was able to get up by herself and was able to ambulate without  pain. Three weeks following first fall patient fell again which worsened pain such that pain had to resort to ambulation using rollator. Prior to falling patiet was independent with mobility and did not require use of AD.    Patient is accompained by: Family member    Limitations Walking;House hold activities;Standing    How long can you sit comfortably? 20 minutes    How long can you stand comfortably? 15 minutes    How long can you walk comfortably? 5 minutes    Patient Stated Goals ambulation without AD;  decreased pain    Currently in Pain? Yes    Pain Score 8     Pain Location Back    Pain Orientation Right    Pain Descriptors / Indicators Radiating;Aching    Pain Type Acute pain    Pain Onset More than a month ago    Pain Frequency Constant    Aggravating Factors  reaching over head; bending; carrying    Pain Relieving Factors heat; muscle relaxer; pain medication              [facility] PT Assessment - 07/12/20 0001      Assessment   Medical Diagnosis shoulder pain/lumbar stenosis    Referring Provider (PT) Marchia Bond, MD    Onset Date/Surgical Date --   approx 5 weeks ago   Hand Dominance Right    Next MD Visit 08/05/2020    Prior Therapy Yes - deconditioning      Precautions   Precautions Other (comment)   Rt breast cancer   Precaution Comments no UBE, no ultrasound, no ice/heat Rt UE      Restrictions   Weight Bearing Restrictions No      Balance Screen   Has the patient fallen in the past 6 months --   >2   Has the patient had a decrease in activity level because of a fear of falling?  Yes    Is the patient reluctant to leave their home because of a fear of falling?  No      Home Environment   Living Environment Private residence    [place] to enter    Entrance Stairs-Number of Steps 2    [place] One level      Prior Function   Level of Independence Independent    Vocation Retired      Associate Professor   Overall  Cognitive Status Within Functional Limits for tasks assessed      Posture/Postural Control   Posture Comments Lt weight shift when seated in chair      ROM /  Strength   AROM / PROM / Strength Strength;AROM      AROM   Overall AROM Comments Increased pain with lumbar flexion, extension, Rt side bend      Strength   Strength Assessment Site Hip;Knee;Ankle    Right/Left Hip Right;Left    Right Hip ABduction 4-/5    Right Hip ADduction 4-/5    Left Hip ABduction 5/5    Left Hip ADduction 4+/5    Right/Left Knee Right;Left    Right Knee Flexion 4/5    Right Knee Extension 3+/5    Left Knee Flexion 5/5    Left Knee Extension 5/5    Right/Left Ankle Right;Left    Right Ankle Dorsiflexion 4-/5    Right Ankle Plantar Flexion 4/5    Left Ankle Dorsiflexion 5/5    Left Ankle Plantar Flexion 5/5      Special Tests    Special Tests Lumbar    Lumbar Tests Slump Test      Slump test   Findings Positive    Side Right      Transfers   Transfers Sit to Stand    Sit to Stand 5: Supervision;From bed;Uncontrolled descent    Five time sit to stand comments  18s      Ambulation/Gait   Ambulation/Gait Yes    Ambulation/Gait Assistance 6: Modified independent (Device/Increase time)    Assistive device Rollator    Gait Pattern Decreased weight shift to right;Antalgic   forward flexed                     Objective measurements completed on examination: See above findings.               PT Education - 07/12/20 1619    Education Details Access Code [email or web address]; Supine Lower Trunk Rotation, Hook Lying Single Knee to Chest Stretch with Towel, Hooklying Isometric Hip Abduction with Belt    Person(s) Educated Patient    Methods Explanation;Demonstration;Tactile cues;Verbal cues;Handout    Comprehension Verbalized understanding;Returned demonstration;Verbal cues required;Tactile cues required;Need further instruction            PT Short Term Goals - 07/12/20 1640       PT SHORT TERM GOAL #1   Title Patient will be independent with HEP for continued progression at home.    Time 4    Period Weeks    Status New    Target Date 08/09/20      PT SHORT TERM GOAL #2   Title Patient will demonstrate 4+/5 Rt LE strength    Baseline grossly 4-/5    Time 4    Period Weeks    Status New    Target Date 08/09/20      PT SHORT TERM GOAL #3   Title Patient will report no more than 4/10 pain for two consecutive sessions to indicate improved activity tolerance.    Baseline 8/10    Time 4    Period Weeks    Status New    Target Date 08/09/20             PT Long Term Goals - 07/12/20 1642      PT LONG TERM GOAL #1   Title Patient will be independent with advanced HEP for long term management of symptoms post D/C.    Time 8    Period Weeks    Status New    Target Date 09/06/20      PT LONG TERM GOAL #2  Title Patient will improved FOTO score by 10 points to indicate improved overall function.    Time 8    Period Weeks    Status New    Target Date 09/06/20      PT LONG TERM GOAL #3   Title Patient will perform five times sit to stand in 12s or less with good eccentric control for decreased fall risk    Baseline 18s; poor ecccentric control    Time 8    Period Weeks    Status New    Target Date 09/06/20      PT LONG TERM GOAL #4   Title Patient will lift 10# from floor level using proper mechanics x5 reps without increased pain to more readily complete ADLs.    Time 8    Period Weeks    Status New    Target Date 09/06/20      PT LONG TERM GOAL #5   Title Patient will complete six minute walk test without AD, minimal gait deviations, and without increased pain to indicate readiness for home and community ambulation    Time 8    Period Weeks    Status New    Target Date 09/06/20                  Plan - 07/12/20 1619    Clinical Impression Statement Patient is a 75 y/o female referred due to Rt sided low back pain with  radicular radiation into Rt LE and Rt shoulder pain following two falls at home. PMH includes history of Rt breast cancer s/p lumpectomy (2015), type 2 diabetes, HTN, and Rt LE DVT. Patient reported activity limitations include ambulation as she is unable to ambulate without use of AD, prolonged standing, prolonged walking, prolonged sitting, and bending for floor level items. Prior to falls patient ambulated independently without use of AD. Currently patient demonstrates Rt LE strength impairments. Lumbar AROM observation partially limited due to balance impairments however, patient reports pain with lumbar flexion and Rt rotation. Functional mobility impaired as patient unable to complete sit to stand transfer without UE support and significant off loading of Rt LE. Patient completing five times sit to stand in 18s indicating increased fall risk. Would benefit from continued skilled intervention to address impairments for improved functional mobility and decreased fall risk.    Personal Factors and Comorbidities Comorbidity 3+;Fitness    Comorbidities history of Rt breast cancer s/p lumpectomy (2015), type 2 diabetes, HTN, Rt LE DVT    Examination-Activity Limitations Bend;Carry;Lift;Locomotion Level;Reach Overhead;Sit;Stand;Transfers    Examination-Participation Restrictions Cleaning;Meal Prep;Laundry;Shop    Stability/Clinical Decision Making Evolving/Moderate complexity    Clinical Decision Making Moderate    Rehab Potential Good    PT Frequency 2x / week    PT Duration 8 weeks    PT Treatment/Interventions ADLs/Self Care Home Management;Aquatic Therapy;Cryotherapy;Electrical Stimulation;Iontophoresis 62m/ml Dexamethasone;Moist Heat;Traction;Gait training;Functional mobility training;Therapeutic activities;Therapeutic exercise;Balance training;Neuromuscular re-education;Patient/family education;Manual techniques;Passive range of motion;Dry needling;Taping;Spinal Manipulations;Joint Manipulations     PT Next Visit Plan review HEP;assess Rt shoulder; FOTO; begin gentle glute and core strengthening    PT Home Exercise Plan Access Code [email or web address]    Consulted and Agree with Plan of Care Patient;Family member/caregiver    Family Member Consulted grand daughter           Patient will benefit from skilled therapeutic intervention in order to improve the following deficits and impairments:  Abnormal gait,Decreased activity tolerance,Decreased balance,Decreased endurance,Decreased range of motion,Decreased safety awareness,Decreased strength,Difficulty walking,Hypomobility,Increased fascial restricitons,Increased muscle  spasms,Impaired UE functional use,Improper body mechanics,Postural dysfunction,Pain  Visit Diagnosis: Cramp and spasm - Plan: PT plan of care cert/re-cert  Muscle weakness (generalized) - Plan: PT plan of care cert/re-cert  Difficulty in walking, not elsewhere classified - Plan: PT plan of care cert/re-cert  Acute right-sided low back pain with right-sided sciatica - Plan: PT plan of care cert/re-cert  Acute pain of right shoulder - Plan: PT plan of care cert/re-cert     Problem List Patient Active Problem List   Diagnosis Date Noted  . Dyspnea 06/28/2020  . Chronic diarrhea - responds to budesonide 09/10/2019  . Iron deficiency anemia 08/19/2018  . Spondylosis of lumbar spine 12/24/2017  . Renal vein thrombosis ([place]) 09/17/2017  . Hoarse voice quality 09/09/2017  . Low back pain without sciatica 09/09/2017  . Labile blood glucose   . Sleep disturbance   . Leukocytosis   . Essential hypertension   . Urinary frequency   . Type 2 diabetes mellitus with peripheral neuropathy (HCC)   . Benign essential HTN   . Stage 2 chronic kidney disease   . Dysphagia   . Debility   . Encephalopathy   . Pressure injury of skin 08/04/2017  . Pneumonia of left lung due to infectious organism   . Diffuse pulmonary alveolar hemorrhage   . SOB (shortness of breath) 10/11/2016  .  Hyperparathyroidism, secondary renal ([place]) 05/02/2016  . Gout 04/11/2015  . Diabetic neuropathy associated with type 2 diabetes mellitus ([place]) 01/06/2015  . SBO (small bowel obstruction) ([place]) 09/15/2013  . Breast cancer of lower-inner quadrant of right female breast ([place]) 03/18/2013  . Chronic venous insufficiency 01/06/2013  . Health care maintenance 05/08/2011  . Diabetes mellitus type 2 in obese ([place]) 05/03/2010  . Seasonal allergies 05/03/2010  . Labile hypertension   . CAD (coronary artery disease)   . Abdominal discomfort   . Anemia in chronic kidney disease   . History of stroke without residual deficits   . Hyperlipidemia associated with type 2 diabetes mellitus ([place])   . Chronic pain   . Chronic kidney disease, stage III (moderate) (HCC)   . Adnexal mass   . Spinal stenosis, lumbar region, with neurogenic claudication 01/03/2009   Everardo All PT, DPT  07/12/20 4:58 PM   [facility] [address], [place], [place], [address] Phone: [phone]   Fax:  [phone]  Name: [name] MRN: [id number] Date of Birth: 1945/03/29

## 2020-07-12 NOTE — Patient Instructions (Signed)
Access Code: [email or web address] URL: [email or web address] Date: 07/12/2020 Prepared by: Everardo All  Exercises Supine Lower Trunk Rotation - 2 x daily - 7 x weekly - 1 sets - 3 reps - 10s hold Hook Lying Single Knee to Chest Stretch with Towel - 2 x daily - 7 x weekly - 1 sets - 3 reps - 10s hold Hooklying Isometric Hip Abduction with Belt - 2 x daily - 7 x weekly - 2 sets - 10 reps - 3s hold Supine Posterior Pelvic Tilt - 2 x daily - 7 x weekly - 2 sets - 10 reps

## 2020-07-14 ENCOUNTER — Encounter (HOSPITAL_COMMUNITY): Payer: Self-pay | Admitting: Emergency Medicine

## 2020-07-14 ENCOUNTER — Other Ambulatory Visit: Payer: Self-pay

## 2020-07-14 ENCOUNTER — Ambulatory Visit: Payer: Medicare Other | Admitting: Physical Therapy

## 2020-07-14 ENCOUNTER — Emergency Department (HOSPITAL_COMMUNITY): Payer: Medicare Other

## 2020-07-14 ENCOUNTER — Emergency Department (HOSPITAL_COMMUNITY)
Admission: EM | Admit: 2020-07-14 | Discharge: 2020-07-14 | Disposition: A | Discharge status: Home / Self Care | Payer: Medicare Other | Source: Home / Self Care | Attending: Emergency Medicine | Admitting: Emergency Medicine

## 2020-07-14 DIAGNOSIS — M25512 Pain in left shoulder: Secondary | ICD-10-CM

## 2020-07-14 DIAGNOSIS — W19XXXA Unspecified fall, initial encounter: Secondary | ICD-10-CM | POA: Insufficient documentation

## 2020-07-14 DIAGNOSIS — D631 Anemia in chronic kidney disease: Secondary | ICD-10-CM | POA: Insufficient documentation

## 2020-07-14 DIAGNOSIS — Z043 Encounter for examination and observation following other accident: Secondary | ICD-10-CM | POA: Diagnosis not present

## 2020-07-14 DIAGNOSIS — M5441 Lumbago with sciatica, right side: Secondary | ICD-10-CM

## 2020-07-14 DIAGNOSIS — I130 Hypertensive heart and chronic kidney disease with heart failure and stage 1 through stage 4 chronic kidney disease, or unspecified chronic kidney disease: Secondary | ICD-10-CM | POA: Insufficient documentation

## 2020-07-14 DIAGNOSIS — I509 Heart failure, unspecified: Secondary | ICD-10-CM | POA: Insufficient documentation

## 2020-07-14 DIAGNOSIS — Z79899 Other long term (current) drug therapy: Secondary | ICD-10-CM | POA: Insufficient documentation

## 2020-07-14 DIAGNOSIS — Y92810 Car as the place of occurrence of the external cause: Secondary | ICD-10-CM | POA: Insufficient documentation

## 2020-07-14 DIAGNOSIS — R2681 Unsteadiness on feet: Secondary | ICD-10-CM

## 2020-07-14 DIAGNOSIS — R252 Cramp and spasm: Secondary | ICD-10-CM

## 2020-07-14 DIAGNOSIS — E1140 Type 2 diabetes mellitus with diabetic neuropathy, unspecified: Secondary | ICD-10-CM | POA: Insufficient documentation

## 2020-07-14 DIAGNOSIS — R262 Difficulty in walking, not elsewhere classified: Secondary | ICD-10-CM

## 2020-07-14 DIAGNOSIS — M6281 Muscle weakness (generalized): Secondary | ICD-10-CM

## 2020-07-14 DIAGNOSIS — I2510 Atherosclerotic heart disease of native coronary artery without angina pectoris: Secondary | ICD-10-CM | POA: Insufficient documentation

## 2020-07-14 DIAGNOSIS — Z859 Personal history of malignant neoplasm, unspecified: Secondary | ICD-10-CM | POA: Insufficient documentation

## 2020-07-14 DIAGNOSIS — N183 Chronic kidney disease, stage 3 unspecified: Secondary | ICD-10-CM | POA: Insufficient documentation

## 2020-07-14 DIAGNOSIS — Z7901 Long term (current) use of anticoagulants: Secondary | ICD-10-CM | POA: Insufficient documentation

## 2020-07-14 DIAGNOSIS — M19012 Primary osteoarthritis, left shoulder: Secondary | ICD-10-CM | POA: Diagnosis not present

## 2020-07-14 DIAGNOSIS — M25511 Pain in right shoulder: Secondary | ICD-10-CM

## 2020-07-14 DIAGNOSIS — Z7984 Long term (current) use of oral hypoglycemic drugs: Secondary | ICD-10-CM | POA: Insufficient documentation

## 2020-07-14 LAB — BASIC METABOLIC PANEL
Anion gap: 10 (ref 5–15)
BUN: 72 mg/dL — ABNORMAL HIGH (ref 8–23)
CO2: 20 mmol/L — ABNORMAL LOW (ref 22–32)
Calcium: 8.5 mg/dL — ABNORMAL LOW (ref 8.9–10.3)
Chloride: 105 mmol/L (ref 98–111)
Creatinine, Ser: 2.55 mg/dL — ABNORMAL HIGH (ref 0.44–1.00)
GFR, Estimated: 19 mL/min — ABNORMAL LOW (ref >60)
Glucose, Bld: 163 mg/dL — ABNORMAL HIGH (ref 70–99)
Potassium: 4.5 mmol/L (ref 3.5–5.1)
Sodium: 135 mmol/L (ref 135–145)

## 2020-07-14 LAB — CBC WITH DIFFERENTIAL/PLATELET
Abs Immature Granulocytes: 0.06 10*3/uL (ref 0.00–0.07)
Basophils Absolute: 0 10*3/uL (ref 0.0–0.1)
Basophils Relative: 0 %
Eosinophils Absolute: 0 10*3/uL (ref 0.0–0.5)
Eosinophils Relative: 1 %
HCT: 31 % — ABNORMAL LOW (ref 36.0–46.0)
Hemoglobin: 9.9 g/dL — ABNORMAL LOW (ref 12.0–15.0)
Immature Granulocytes: 1 %
Lymphocytes Relative: 16 %
Lymphs Abs: 1.4 10*3/uL (ref 0.7–4.0)
MCH: 29.7 pg (ref 26.0–34.0)
MCHC: 31.9 g/dL (ref 30.0–36.0)
MCV: 93.1 fL (ref 80.0–100.0)
Monocytes Absolute: 0.6 10*3/uL (ref 0.1–1.0)
Monocytes Relative: 7 %
Neutro Abs: 6.2 10*3/uL (ref 1.7–7.7)
Neutrophils Relative %: 75 %
Platelets: 246 10*3/uL (ref 150–400)
RBC: 3.33 MIL/uL — ABNORMAL LOW (ref 3.87–5.11)
RDW: 14.9 % (ref 11.5–15.5)
WBC: 8.3 10*3/uL (ref 4.0–10.5)
nRBC: 0 % (ref 0.0–0.2)

## 2020-07-14 MED ORDER — HYDROMORPHONE HCL 2 MG PO TABS: 2.0000 mg | ORAL_TABLET | Freq: Four times a day (QID) | ORAL | 0 refills | Status: DC | PRN

## 2020-07-14 MED ORDER — HYDROMORPHONE HCL 2 MG PO TABS
2.0000 mg | ORAL_TABLET | Freq: Once | ORAL | Status: AC
  Administered 2020-07-14: 2 mg via ORAL
  Filled 2020-07-14: qty 1

## 2020-07-14 NOTE — ED Triage Notes (Signed)
Pt had a fall today- no CP/SOB prior to fall. Pt is on blood thinners but did not hit her head. Pt was recently diagnosed with blood clot in right lower leg. Pts left arm hurting from fall.

## 2020-07-14 NOTE — ED Provider Notes (Signed)
Emergency Medicine Provider Triage Evaluation Note  [name], a 75 y.o. female evaluated in triage.  Pt complains of mechanical fall, on thinners. No head trauma. Left arm and shoulder pain. DVT right leg on eliquis. No headache. No numbness in hand.   BP 139/70 (BP Location: Right Arm)   Pulse 84   Temp 98.8 F (37.1 C) (Oral)   Resp 20   Ht 5' 4"  (1.626 m)   Wt 81.6 kg   SpO2 99%   BMI 30.90 kg/m   Patient is alert, no acute distress, normal work of breathing    Medically screening exam initiated at 1:55 PM. Appropriate orders placed.  [name] was informed that the remainder of the evaluation will be completed by another provider, this initial triage assessment does not replace that evaluation, and the importance of remaining in the ED until their evaluation is complete.       Keyira Mondesir, [name] N, PA-C 07/14/20 1357    Carmin Muskrat, MD 07/22/20 1655

## 2020-07-14 NOTE — Patient Instructions (Signed)
Seated Hip Abduction with Resistance - 1 x daily - 7 x weekly - 2 sets - 10 reps Seated Isometric Hip Adduction with Ball - 1 x daily - 7 x weekly - 2 sets - 10 reps Standing Row with Anchored Resistance - 1 x daily - 7 x weekly - 2 sets - 10 reps Shoulder extension with resistance - Neutral - 1 x daily - 7 x weekly - 2 sets - 10 reps

## 2020-07-14 NOTE — ED Provider Notes (Signed)
[facility] EMERGENCY DEPARTMENT Provider Note   CSN: [id number] Arrival date & time: 07/14/20  1348     History No chief complaint on file.   [name] is a 75 y.o. female with past medical history of HTN, CAD, gout, CVA, type II DM, CKD, CHF, and recent hospitalization for exertional dyspnea.  I reviewed her medical record and during her recent hospitalization, ACS work-up was negative.  Suspected to be possibly related to pulmonary hypertension.  She did have right lower extremity with evidence of DVT and was started on Eliquis, however VQ scan was negative for large central pulmonary embolism.  She also had AKI, up from her baseline creatinine of 1.6-1.8.  On my examination, patient reports that she was getting out of the driver seat of her car with her left cane when it "gave out" causing her to fall to the ground landing on her left side.  She complains of pain from her left shoulder down to her left elbow.  She is keeping her arm adducted, flexed.  She denies any precipitating presyncopal prodrome.  She denies any head injury or LOC.  She has been taking her blood thinners, as prescribed.  She also denies any chest pain, chest wall pain, abdominal pain, shortness of breath, or other injuries.  She has a Rollator at home which can use.  She lives independently, but has family close by.  Her daughter, hospitalist NP, is at bedside and will drive her home.  She states that patient gets agitated with oxycodone, but has been doing well at home on her oral Dilaudid medication for chronic pain.    HPI     Past Medical History:  Diagnosis Date  . Abdominal discomfort    Chronic N/V/D. Presumptive dx Crohn's dx per elevated p ANCA. Failed Entocort and Pentasa. Sep 2003 - ileocolectomy c anastomosis per Dr Deon Pilling 2/2 adhesions - path was hegative for Crohns. EGD, Sm bowel follow through (11/03), and an eteroclysis (10/03) were unrevealing. Cuases hypomag and  hypocalcemia.  . Adnexal mass 8/03   s/p lap BSO (R ovarian fibroma) & lysis of adhesions  . Allergy    Seasonal  . Anemia    Multifactorial. Baseline HgB 10-11 ish. B12 def - 150 in 3/10. Fe Def - ferritin 35 3/10. Both are being repleted.  . Breast cancer ([place]) 03/16/13   right, 5 o'clock  . CAD (coronary artery disease) 1996   1996 - PTCA and angioplasty diagonal branch. 2000 - Rotoblator & angiopllasty of diagonal. 2006 - subendocardial AMI, DES to proximal LAD.Marland Kitchen Also had 90% stenosis in distal apical LAD. EF 55 with apical hypokinesis. Indefinite ASA and Plavix.  . CHF (congestive heart failure) ([place])   . Chronic kidney disease    Chronic renal insuff baseline Cr 1.2 - 1.4 ish.  . Chronic pain    CT 10/10 = Spinal stenosis L2 - S1.  . Diabetes mellitus    Insulin dependent  . Gout   . Hx of radiation therapy 06/02/13- 07/16/13   right rbeast 4500 cGy 25 sessions, right breast boost 1600 cGy in 8 sessions  . Hyperlipidemia    Managed with both a statin and Welchol. Welchol stopped 2014 2/2 cost and started on fenofibrate   . Hypertension    2006 B renal arteries patent. 2003 MRA - no RAS. 2003 pheo W/U Dr Hassell Done reportedly negative.  . Hypoxia 07/23/2017  . Lupus ([place])   . Lymphedema of breast   . Personal  history of radiation therapy 2015  . RBBB   . Renal vein thrombosis ([place])   . SBO (small bowel obstruction) ([place]) 09/17/2017  . Secondary hyperparathyroidism ([place])   . Stroke [facility])    Incidental finding MRI 2002 L lacunar infarct  . Vitamin B12 deficiency   . Vitamin D deficiency   . Wears dentures    top    Patient Active Problem List   Diagnosis Date Noted  . Dyspnea 06/28/2020  . Chronic diarrhea - responds to budesonide 09/10/2019  . Iron deficiency anemia 08/19/2018  . Spondylosis of lumbar spine 12/24/2017  . Renal vein thrombosis ([place]) 09/17/2017  . Hoarse voice quality 09/09/2017  . Low back pain without sciatica 09/09/2017  . Labile blood glucose   . Sleep  disturbance   . Leukocytosis   . Essential hypertension   . Urinary frequency   . Type 2 diabetes mellitus with peripheral neuropathy (HCC)   . Benign essential HTN   . Stage 2 chronic kidney disease   . Dysphagia   . Debility   . Encephalopathy   . Pressure injury of skin 08/04/2017  . Pneumonia of left lung due to infectious organism   . Diffuse pulmonary alveolar hemorrhage   . SOB (shortness of breath) 10/11/2016  . Hyperparathyroidism, secondary renal ([place]) 05/02/2016  . Gout 04/11/2015  . Diabetic neuropathy associated with type 2 diabetes mellitus ([place]) 01/06/2015  . SBO (small bowel obstruction) ([place]) 09/15/2013  . Breast cancer of lower-inner quadrant of right female breast ([place]) 03/18/2013  . Chronic venous insufficiency 01/06/2013  . Health care maintenance 05/08/2011  . Diabetes mellitus type 2 in obese ([place]) 05/03/2010  . Seasonal allergies 05/03/2010  . Labile hypertension   . CAD (coronary artery disease)   . Abdominal discomfort   . Anemia in chronic kidney disease   . History of stroke without residual deficits   . Hyperlipidemia associated with type 2 diabetes mellitus ([place])   . Chronic pain   . Chronic kidney disease, stage III (moderate) (HCC)   . Adnexal mass   . Spinal stenosis, lumbar region, with neurogenic claudication 01/03/2009    Past Surgical History:  Procedure Laterality Date  . ABDOMINAL HYSTERECTOMY    . BILATERAL SALPINGOOPHORECTOMY  8/03   Lap BSO (R ovarian fibroma) and adhesion lysis  . BOWEL RESECTION  2003   ileocolectomy with anastomosis 2/2 adhesions  . BREAST BIOPSY Right 2015  . BREAST BIOPSY Right 07/2014  . BREAST LUMPECTOMY Right 04/22/2013  . BREAST LUMPECTOMY WITH NEEDLE LOCALIZATION AND AXILLARY SENTINEL LYMPH NODE BX Right 04/22/2013   Procedure: BREAST LUMPECTOMY WITH NEEDLE LOCALIZATION AND AXILLARY SENTINEL LYMPH NODE BX;  Surgeon: Stark Klein, MD;  Location: [facility];  Service: General;  Laterality:  Right;  . CARDIAC CATHETERIZATION     2 stents  . CHOLECYSTECTOMY    . COLONOSCOPY    . HEMICOLECTOMY     R sided hemicolectomy  . HERNIA REPAIR     Ventral hernia repair  . PTCA  4/06     OB History   No obstetric history on file.     Family History  Problem Relation Age of Onset  . Pancreatic cancer Brother 27  . Breast cancer Mother 24  . Lung cancer Maternal Aunt   . Breast cancer Maternal Aunt   . Prostate cancer Maternal Uncle   . Heart attack Maternal Grandmother   . Breast cancer Maternal Grandmother   . Colon cancer Maternal Grandmother   .  Clotting disorder Maternal Grandmother   . Diabetes Maternal Grandmother   . Kidney failure Maternal Aunt   . Hypertension Daughter     Social History   Tobacco Use  . Smoking status: Never Smoker  . Smokeless tobacco: Never Used  Vaping Use  . Vaping Use: Never used  Substance Use Topics  . Alcohol use: No    Alcohol/week: 0.0 standard drinks  . Drug use: No    Home Medications Prior to Admission medications   Medication Sig Start Date End Date Taking? Authorizing Provider  allopurinol (ZYLOPRIM) 100 MG tablet Take 0.5 tablets (50 mg total) by mouth every other day. 07/01/20  Yes Mercy Riding, MD  amLODipine (NORVASC) 10 MG tablet Take 1 tablet (10 mg total) by mouth daily. 08/22/17  Yes Angiulli, Lavon Paganini, PA-C  apixaban (ELIQUIS) 5 MG TABS tablet Take 1 tablet (5 mg total) by mouth 2 (two) times daily. Start after you finish the starter pack 08/01/20  Yes Gonfa, Taye T, MD  atorvastatin (LIPITOR) 40 MG tablet Take 40 mg by mouth daily. 07/27/19  Yes [provider]  budesonide (ENTOCORT EC) 3 MG 24 hr capsule Take 1 capsule by mouth once daily Patient taking differently: Take 3 mg by mouth daily. 04/06/20  Yes Gatha Mayer, MD  colestipol (COLESTID) 1 g tablet Take 2 g by mouth at bedtime. 03/17/19  Yes [provider]  Cyanocobalamin (B-12 COMPLIANCE INJECTION IJ) Inject 1 vial as directed every  30 (thirty) days.   Yes [provider]  Epoetin Alfa-epbx (RETACRIT IJ) Inject 1 vial as directed every 30 (thirty) days.   Yes [provider]  ferrous fumarate (HEMOCYTE - 106 MG FE) 325 (106 Fe) MG TABS tablet Take 325 mg of iron by mouth daily.   Yes [provider]  hydrALAZINE (APRESOLINE) 100 MG tablet TAKE 1 TABLET BY MOUTH 3  TIMES DAILY Patient taking differently: Take 100 mg by mouth 3 (three) times daily. 01/20/20  Yes Josue Hector, MD  HYDROmorphone (DILAUDID) 2 MG tablet Take 1 tablet (2 mg total) by mouth every 6 (six) hours as needed for severe pain. 07/14/20  Yes Corena Herter, PA-C  isosorbide mononitrate (IMDUR) 30 MG 24 hr tablet TAKE 1/2 (ONE-HALF) TABLET BY MOUTH ONCE DAILY (PLEASE  SCHEDULE  AN  APPOINTMENT  FOR  FURTHER  REFILLS) Patient taking differently: Take 15 mg by mouth daily. 07/12/20  Yes Isaiah Serge, NP  linagliptin (TRADJENTA) 5 MG TABS tablet Take 5 mg by mouth daily.   Yes [provider]  Melatonin 5 MG TABS Take 10 mg by mouth at bedtime.   Yes [provider]  nebivolol (BYSTOLIC) 10 MG tablet Take 1 tablet (10 mg total) by mouth daily. 06/30/20  Yes Mercy Riding, MD  nitroGLYCERIN (NITROSTAT) 0.4 MG SL tablet DISSOLVE 1 TABLET UNDER THE TONGUE EVERY 5 MINUTES AS  NEEDED FOR CHEST PAIN. MAX  OF 3 TABLETS IN 15 MINUTES. CALL 911 IF PAIN PERSISTS. Patient taking differently: Place 0.4 mg under the tongue every 5 (five) minutes as needed for chest pain. 10/28/19  Yes Josue Hector, MD  pantoprazole (PROTONIX) 40 MG tablet Take 1 tablet (40 mg total) by mouth daily before breakfast. 08/27/19  Yes Gatha Mayer, MD  pregabalin (LYRICA) 50 MG capsule Take 1 capsule (50 mg total) by mouth 2 (two) times daily. 06/30/20  Yes Mercy Riding, MD  torsemide (DEMADEX) 20 MG tablet Take 2 tablets (40  mg total) by mouth daily. 07/11/20  Yes Josue Hector, MD  Vitamin D, Ergocalciferol, (DRISDOL) 1.25 MG (50000 UNIT) CAPS  capsule Take 1 capsule (50,000 Units total) by mouth every 7 (seven) days. 08/13/19  Yes Truitt Merle, MD    Allergies    Percocet [oxycodone-acetaminophen]; Diazepam; Haloperidol lactate; Lorazepam; Morphine sulfate; Propoxyphene hcl; Tramadol hcl; Anesthetics, halogenated; Etomidate; Fentanyl; Tramadol hcl; and Versed [midazolam]  Review of Systems   Review of Systems  All other systems reviewed and are negative.   Physical Exam Updated Vital Signs BP (!) 154/75 (BP Location: Right Arm)   Pulse 85   Temp 98 F (36.7 C) (Oral)   Resp 15   Ht 5' 4"  (1.626 m)   Wt 81.6 kg   SpO2 99%   BMI 30.90 kg/m   Physical Exam Vitals and nursing note reviewed. Exam conducted with a chaperone present.  Constitutional:      General: She is not in acute distress.    Appearance: Normal appearance. She is not ill-appearing.  HENT:     Head: Normocephalic and atraumatic.     Comments: No palpable deficits. No evidence of trauma whatsoever. Eyes:     General: No scleral icterus.    Conjunctiva/sclera: Conjunctivae normal.  Neck:     Comments: No midline cervical tenderness. ROM intact. Cardiovascular:     Rate and Rhythm: Normal rate and regular rhythm.     Pulses: Normal pulses.  Pulmonary:     Effort: Pulmonary effort is normal. No respiratory distress.     Comments: Breath sounds intact bilaterally.  No chest wall tenderness.  No overlying skin changes. Abdominal:     General: Abdomen is flat. There is no distension.     Palpations: Abdomen is soft.     Tenderness: There is no abdominal tenderness.  Musculoskeletal:        General: Tenderness present. No deformity.     Cervical back: Normal range of motion.     Comments: Left shoulder: Tenderness along proximal humerus, midshaft humerus, and distal humerus.  No significant clavicular tenderness or deformity noted.  No significant ecchymoses, swelling, or overlying skin changes.  Compartments are soft.  Arm is flexed and adducted, held  close to body.  No tenderness over radius, ulna, wrist, or hand.  Flexion and extension of wrist and elbow remains intact. Peripheral pulses intact and symmetric.  Sensation intact throughout.    Skin:    General: Skin is dry.  Neurological:     General: No focal deficit present.     Mental Status: She is alert and oriented to person, place, and time.     GCS: GCS eye subscore is 4. GCS verbal subscore is 5. GCS motor subscore is 6.  Psychiatric:        Mood and Affect: Mood normal.        Behavior: Behavior normal.        Thought Content: Thought content normal.     ED Results / Procedures / Treatments   Labs (all labs ordered are listed, but only abnormal results are displayed) Labs Reviewed  CBC WITH DIFFERENTIAL/PLATELET - Abnormal; Notable for the following components:      Result Value   RBC 3.33 (*)    Hemoglobin 9.9 (*)    HCT 31.0 (*)    All other components within normal limits  BASIC METABOLIC PANEL - Abnormal; Notable for the following components:   CO2 20 (*)    Glucose, Bld 163 (*)  BUN 72 (*)    Creatinine, Ser 2.55 (*)    Calcium 8.5 (*)    GFR, Estimated 19 (*)    All other components within normal limits    EKG None  Radiology DG Shoulder Left  Result Date: 07/14/2020 CLINICAL DATA:  fall EXAM: LEFT SHOULDER - 2+ VIEW COMPARISON:  None. FINDINGS: There is mild glenohumeral degenerative change. There is mild AC joint degenerative change. There is no evidence of fracture. IMPRESSION: No evidence of acute fracture or dislocation. Mild glenohumeral and AC joint degenerative change. Electronically Signed   By: Maurine Simmering   On: 07/14/2020 15:14   DG Humerus Left  Result Date: 07/14/2020 CLINICAL DATA:  fall EXAM: LEFT HUMERUS - 2+ VIEW COMPARISON:  None. FINDINGS: There is no evidence of fracture.  Soft tissues are unremarkable. IMPRESSION: No evidence of acute left humerus fracture. Electronically Signed   By: Maurine Simmering   On: 07/14/2020 15:13     Procedures Procedures   Medications Ordered in ED Medications  HYDROmorphone (DILAUDID) tablet 2 mg (2 mg Oral Given 07/14/20 1603)    ED Course  I have reviewed the triage vital signs and the nursing notes.  Pertinent labs & imaging results that were available during my care of the patient were reviewed by me and considered in my medical decision making (see chart for details).    MDM Rules/Calculators/A&P                          Dominiqua [name] Sutley was evaluated in Emergency Department on 07/14/2020 for the symptoms described in the history of present illness. She was evaluated in the context of the global COVID-19 pandemic, which necessitated consideration that the patient might be at risk for infection with the SARS-CoV-2 virus that causes COVID-19. Institutional protocols and algorithms that pertain to the evaluation of patients at risk for COVID-19 are in a state of rapid change based on information released by regulatory bodies including the CDC and federal and state organizations. These policies and algorithms were followed during the patient's care in the ED.  I personally reviewed patient's medical chart and all notes from triage and staff during today's encounter. I have also ordered and reviewed all labs and imaging that I felt to be medically necessary in the evaluation of this patient's complaints and with consideration of their physical exam. If needed, translation services were available and utilized.   Plain films of left shoulder and humerus are personally reviewed and demonstrate no acute fracture, dislocation, or other osseous findings.  My physical exam is largely benign and I do not feel as though additional imaging is warranted.  I discussed that ligamentous and tendinous injuries cannot be excluded.  Placed in sling and referred to orthopedics for ongoing evaluation and management.  She has been advised against taking NSAIDs given recent initiation of Eliquis.   Recommending Tylenol for pain, hydromorphone for breakthrough symptoms.  I reviewed patient's PDMP and she was recently prescribed Dilaudid 2 mg oral for at home use for breakthrough pain symptoms.  Patient and daughter states that that medication has been well-tolerated.  She also will take tizanidine intermittently.  I cautioned her about the risks of taking these medications together as they each can make her drowsy and prone to falls.  She also takes Lyrica for chronic pain syndrome.  On subsequent evaluation, patient's pain well controlled with her 2 mg hydromorphone.  This medication does not make her nauseated,  declines antiemetics.  Patient and daughter at bedside state that they will call the office of Dr. Mardelle Matte, her orthopedist, to schedule appointment for evaluation.  While patient has mildly worsening renal function compared to baseline, suspect prerenal and I have lower suspicion for postobstructive or intrarenal cause.  She is denying any flank pain symptoms or obvious hematuria.  Discussed with Dr. Vanita Panda who agrees with assessment and plan.  Final Clinical Impression(s) / ED Diagnoses Final diagnoses:  Accident due to mechanical fall without injury, initial encounter  Acute pain of left shoulder    Rx / DC Orders ED Discharge Orders         Ordered    HYDROmorphone (DILAUDID) 2 MG tablet  Every 6 hours PRN        07/14/20 1848           Corena Herter, PA-C 07/14/20 1853    Carmin Muskrat, MD 07/22/20 1655

## 2020-07-14 NOTE — Progress Notes (Signed)
Orthopedic Tech Progress Note Patient Details:  [name] 05/19/1945 [id number]  Ortho Devices Type of Ortho Device: Arm sling Ortho Device/Splint Location: LUE Ortho Device/Splint Interventions: Ordered,Application,Adjustment   Post Interventions Patient Tolerated: Well Instructions Provided: Adjustment of device,Care of device,Poper ambulation with device   Fitz Matsuo 07/14/2020, 7:02 PM

## 2020-07-14 NOTE — Therapy (Signed)
[facility] [address] [place], [place], [address] Phone: [phone]   Fax:  [phone]  Physical Therapy Treatment  Patient Details  Name: [name] MRN: [id number] Date of Birth: 14-Aug-1945 Referring Provider (PT): Marchia Bond, MD   Encounter Date: 07/14/2020   PT End of Session - 07/14/20 1000    Visit Number 2    Date for PT Re-Evaluation 09/09/20    Authorization Type [employer] Medicare    Progress Note Due on Visit 10    PT Start Time 0845    PT Stop Time 0934    PT Time Calculation (min) 49 min    Activity Tolerance Patient tolerated treatment well;No increased pain    Behavior During Therapy WFL for tasks assessed/performed           Past Medical History:  Diagnosis Date  . Abdominal discomfort    Chronic N/V/D. Presumptive dx Crohn's dx per elevated p ANCA. Failed Entocort and Pentasa. Sep 2003 - ileocolectomy c anastomosis per Dr Deon Pilling 2/2 adhesions - path was hegative for Crohns. EGD, Sm bowel follow through (11/03), and an eteroclysis (10/03) were unrevealing. Cuases hypomag and hypocalcemia.  . Adnexal mass 8/03   s/p lap BSO (R ovarian fibroma) & lysis of adhesions  . Allergy    Seasonal  . Anemia    Multifactorial. Baseline HgB 10-11 ish. B12 def - 150 in 3/10. Fe Def - ferritin 35 3/10. Both are being repleted.  . Breast cancer ([place]) 03/16/13   right, 5 o'clock  . CAD (coronary artery disease) 1996   1996 - PTCA and angioplasty diagonal branch. 2000 - Rotoblator & angiopllasty of diagonal. 2006 - subendocardial AMI, DES to proximal LAD.Marland Kitchen Also had 90% stenosis in distal apical LAD. EF 55 with apical hypokinesis. Indefinite ASA and Plavix.  . CHF (congestive heart failure) ([place])   . Chronic kidney disease    Chronic renal insuff baseline Cr 1.2 - 1.4 ish.  . Chronic pain    CT 10/10 = Spinal stenosis L2 - S1.  . Diabetes mellitus    Insulin dependent  . Gout   . Hx of radiation  therapy 06/02/13- 07/16/13   right rbeast 4500 cGy 25 sessions, right breast boost 1600 cGy in 8 sessions  . Hyperlipidemia    Managed with both a statin and Welchol. Welchol stopped 2014 2/2 cost and started on fenofibrate   . Hypertension    2006 B renal arteries patent. 2003 MRA - no RAS. 2003 pheo W/U Dr Hassell Done reportedly negative.  . Hypoxia 07/23/2017  . Lupus ([place])   . Lymphedema of breast   . Personal history of radiation therapy 2015  . RBBB   . Renal vein thrombosis ([place])   . SBO (small bowel obstruction) ([place]) 09/17/2017  . Secondary hyperparathyroidism ([place])   . Stroke [facility])    Incidental finding MRI 2002 L lacunar infarct  . Vitamin B12 deficiency   . Vitamin D deficiency   . Wears dentures    top    Past Surgical History:  Procedure Laterality Date  . ABDOMINAL HYSTERECTOMY    . BILATERAL SALPINGOOPHORECTOMY  8/03   Lap BSO (R ovarian fibroma) and adhesion lysis  . BOWEL RESECTION  2003   ileocolectomy with anastomosis 2/2 adhesions  . BREAST BIOPSY Right 2015  . BREAST BIOPSY Right 07/2014  . BREAST LUMPECTOMY Right 04/22/2013  . BREAST LUMPECTOMY WITH NEEDLE LOCALIZATION AND AXILLARY SENTINEL LYMPH NODE BX Right 04/22/2013  Procedure: BREAST LUMPECTOMY WITH NEEDLE LOCALIZATION AND AXILLARY SENTINEL LYMPH NODE BX;  Surgeon: Stark Klein, MD;  Location: [facility];  Service: General;  Laterality: Right;  . CARDIAC CATHETERIZATION     2 stents  . CHOLECYSTECTOMY    . COLONOSCOPY    . HEMICOLECTOMY     R sided hemicolectomy  . HERNIA REPAIR     Ventral hernia repair  . PTCA  4/06    There were no vitals filed for this visit.   Subjective Assessment - 07/14/20 0847    Subjective Patient reports that Rt shoulder mostly bothers her when attempting to style her hair and when she twists in certain directions.    Pertinent History history of Rt breast cancer s/p lumpectomy (2015), type 2 diabetes, HTN, Rt LE DVT    Limitations Walking;House hold  activities;Standing    How long can you sit comfortably? 20 minutes    How long can you stand comfortably? 15 minutes    How long can you walk comfortably? 5 minutes    Patient Stated Goals ambulation without AD;  decreased pain    Currently in Pain? Yes    Pain Score 6     Pain Location Back    Pain Orientation Right    Pain Descriptors / Indicators Aching;Radiating    Pain Type Acute pain    Pain Radiating Towards Rt distal LE    Pain Onset More than a month ago    Pain Frequency Constant    Multiple Pain Sites Yes    Pain Score 4    Pain Location Shoulder    Pain Orientation Right    Pain Descriptors / Indicators Sharp    Pain Type Acute pain    Pain Onset More than a month ago    Pain Frequency Intermittent    Aggravating Factors  twisting UE, overhead ADLs              [facility] PT Assessment - 07/14/20 0001      AROM   Right Shoulder Flexion 89 Degrees    Right Shoulder ABduction 79 Degrees      Strength   Right Shoulder Flexion 3+/5   pain!   Right Shoulder ABduction 4-/5    Right Shoulder Internal Rotation 5/5    Right Shoulder External Rotation 3-/5      Special Tests   Rotator Cuff Impingment tests Michel Bickers test;Empty Can test;Full Can test      Hawkins-Kennedy test   Findings Positive    Side Right      Empty Can test   Findings Positive    Side Right      Full Can test   Findings Positive    Side Right                         [facility] Adult PT Treatment/Exercise - 07/14/20 0001      Transfers   Transfers Sit to Stand    Sit to Stand 5: Supervision;With upper extremity assist      Ambulation/Gait   Ambulation/Gait Yes    Ambulation/Gait Assistance --   CGA with quad cane this date   Gait Pattern Decreased stance time - right;Decreased stride length;Decreased hip/knee flexion - right;Decreased hip/knee flexion - left;Antalgic;Lateral trunk lean to left;Poor foot clearance - right    Ambulation Surface Level    Gait velocity  decreased    Gait Comments VC for three point gait pattern using  quad cane; further cuing for maintaining quad cane proximity to body to prevent increased anterior lateral trunk lean; intermittent CGA for increased posterior sway to prevent falling      Lumbar Exercises: Stretches   Single Knee to Chest Stretch Right;3 reps;10 seconds    Single Knee to Chest Stretch Limitations using towel for active assist    Lower Trunk Rotation Limitations 10 reps x 5s hold each side    Piriformis Stretch Limitations attempted but patient unable to tolerate due to increased pain      Lumbar Exercises: Seated   Long Arc Quad on Chair Right;1 set;10 reps    LAQ on Chair Weights (lbs) 1    Other Seated Lumbar Exercises hip flexion; Rt only; x10 repetitions    Other Seated Lumbar Exercises clamshell; green loop; 2 x 10; hip adductions squeeze with towel x10 repetitions      Lumbar Exercises: Supine   Pelvic Tilt 10 reps    Pelvic Tilt Limitations continual verbal cues for decreased valsalva manuever    Glut Set Limitations 2 x 10 reps; verbal cues for decreased valsalva manuever      Lumbar Exercises: Sidelying   Clam Right    Clam Limitations 2 x 10 reps; verbal and tactile cues for decreased pelvic rotation      Shoulder Exercises: Standing   Extension Both;10 reps    Theraband Level (Shoulder Extension) Level 1 (Yellow)    Row Both    Theraband Level (Shoulder Row) Level 1 (Yellow)    Row Weight (lbs) 2 x 10 reps    Row Limitations max verbal and tactile cuing for form and decreased scapular elevation                  PT Education - 07/14/20 0953    Education Details added seated hip abduction; seated hip adduction isometrics, row, shoulder extension    Person(s) Educated Patient    Methods Explanation;Demonstration;Tactile cues;Verbal cues;Handout    Comprehension Verbalized understanding;Returned demonstration;Verbal cues required;Tactile cues required;Need further instruction             PT Short Term Goals - 07/14/20 0960      PT SHORT TERM GOAL #1   Title Patient will be independent with HEP for continued progression at home.    Time 4    Period Weeks    Status On-going   first visit since evaluation   Target Date 08/09/20      PT SHORT TERM GOAL #2   Title Patient will demonstrate 4+/5 Rt LE strength to more readily complete functional transfers    Baseline grossly 4-/5    Time 4    Period Weeks    Status New    Target Date 08/09/20      PT SHORT TERM GOAL #4   Title Patient will demonstrate 120 degrees Rt shoulder flexion and abduction to more readily complete ADLs.    Baseline Flexion 89; Abduction 79    Time 4    Period Weeks    Status New    Target Date 08/12/20             PT Long Term Goals - 07/12/20 1642      PT LONG TERM GOAL #1   Title Patient will be independent with advanced HEP for long term management of symptoms post D/C.    Time 8    Period Weeks    Status New    Target Date 09/06/20  PT LONG TERM GOAL #2   Title Patient will improved FOTO score by 10 points to indicate improved overall function.    Time 8    Period Weeks    Status New    Target Date 09/06/20      PT LONG TERM GOAL #3   Title Patient will perform five times sit to stand in 12s or less with good eccentric control for decreased fall risk    Baseline 18s; poor ecccentric control    Time 8    Period Weeks    Status New    Target Date 09/06/20      PT LONG TERM GOAL #4   Title Patient will lift 10# from floor level using proper mechanics x5 reps without increased pain to more readily complete ADLs.    Time 8    Period Weeks    Status New    Target Date 09/06/20      PT LONG TERM GOAL #5   Title Patient will complete six minute walk test without AD, minimal gait deviations, and without increased pain to indicate readiness for home and community ambulation    Time 8    Period Weeks    Status New    Target Date 09/06/20                  Plan - 07/14/20 0953    Clinical Impression Statement Patient requiring CGA throughout session as she presented using quad cane this date. Demonstrates intermittent posterior sway while ambulating requiring CGA to prevent fall which indicates increased fall risk. Therapist providing max verbal cuing for using step-to gait pattern with quad cane and keeping AD in close proximity to prevent anterior lateral loss of balance. Tactile and verbal cues provided for decreased trunk extension as leverage when performing seated LAQ and hip flexion. Further cuing required to prevent use of valsalva manuever for all other exercises. Shoulder stress testing indicates possible rotator cuff pathology. Patient exhibits significant flexion and external rotation strength impairments. Would benefit from continues skilled therapeutic intervention to address impairments for improved functional mobility, decreased fall risk, and to more readily complete ADLs using dominant Rt UE.    Personal Factors and Comorbidities Comorbidity 3+;Fitness    Comorbidities history of Rt breast cancer s/p lumpectomy (2015), type 2 diabetes, HTN, Rt LE DVT    Examination-Activity Limitations Bend;Carry;Lift;Locomotion Level;Reach Overhead;Sit;Stand;Transfers    Examination-Participation Restrictions Cleaning;Meal Prep;Laundry;Shop    Rehab Potential Good    PT Frequency 2x / week    PT Duration 8 weeks    PT Treatment/Interventions ADLs/Self Care Home Management;Aquatic Therapy;Cryotherapy;Electrical Stimulation;Iontophoresis 9m/ml Dexamethasone;Moist Heat;Traction;Gait training;Functional mobility training;Therapeutic activities;Therapeutic exercise;Balance training;Neuromuscular re-education;Patient/family education;Manual techniques;Passive range of motion;Dry needling;Taping;Spinal Manipulations;Joint Manipulations    PT Next Visit Plan review new HEP; begin scapular stabilization; continue glute and core strengthening;  begin static and dynamic balance    PT Home Exercise Plan Access Code [employer]          Patient will benefit from skilled therapeutic intervention in order to improve the following deficits and impairments:  Abnormal gait,Decreased activity tolerance,Decreased balance,Decreased endurance,Decreased range of motion,Decreased safety awareness,Decreased strength,Difficulty walking,Hypomobility,Increased fascial restricitons,Increased muscle spasms,Impaired UE functional use,Improper body mechanics,Postural dysfunction,Pain  Visit Diagnosis: Cramp and spasm  Muscle weakness (generalized)  Difficulty in walking, not elsewhere classified  Acute right-sided low back pain with right-sided sciatica  Acute pain of right shoulder  Unsteadiness on feet     Problem List Patient Active Problem List   Diagnosis Date  Noted  . Dyspnea 06/28/2020  . Chronic diarrhea - responds to budesonide 09/10/2019  . Iron deficiency anemia 08/19/2018  . Spondylosis of lumbar spine 12/24/2017  . Renal vein thrombosis ([place]) 09/17/2017  . Hoarse voice quality 09/09/2017  . Low back pain without sciatica 09/09/2017  . Labile blood glucose   . Sleep disturbance   . Leukocytosis   . Essential hypertension   . Urinary frequency   . Type 2 diabetes mellitus with peripheral neuropathy (HCC)   . Benign essential HTN   . Stage 2 chronic kidney disease   . Dysphagia   . Debility   . Encephalopathy   . Pressure injury of skin 08/04/2017  . Pneumonia of left lung due to infectious organism   . Diffuse pulmonary alveolar hemorrhage   . SOB (shortness of breath) 10/11/2016  . Hyperparathyroidism, secondary renal ([place]) 05/02/2016  . Gout 04/11/2015  . Diabetic neuropathy associated with type 2 diabetes mellitus ([place]) 01/06/2015  . SBO (small bowel obstruction) ([place]) 09/15/2013  . Breast cancer of lower-inner quadrant of right female breast ([place]) 03/18/2013  . Chronic venous insufficiency 01/06/2013  . Health  care maintenance 05/08/2011  . Diabetes mellitus type 2 in obese ([place]) 05/03/2010  . Seasonal allergies 05/03/2010  . Labile hypertension   . CAD (coronary artery disease)   . Abdominal discomfort   . Anemia in chronic kidney disease   . History of stroke without residual deficits   . Hyperlipidemia associated with type 2 diabetes mellitus ([place])   . Chronic pain   . Chronic kidney disease, stage III (moderate) (HCC)   . Adnexal mass   . Spinal stenosis, lumbar region, with neurogenic claudication 01/03/2009   Everardo All PT, DPT  07/14/20 10:02 AM   [facility] [address] [place], [place], [address] Phone: [phone]   Fax:  [phone]  Name: [name] MRN: [id number] Date of Birth: September 01, 1945

## 2020-07-14 NOTE — ED Notes (Signed)
States pain has decreased to a 6 or 7

## 2020-07-14 NOTE — Discharge Instructions (Addendum)
Your work-up and exam today was reassuring.  However, I am just because there were no obvious fractures or other bony abnormalities, cannot exclude ligamentous or tendinous injury.  Please follow-up with your orthopedist, Dr. Mardelle Matte, for ongoing evaluation and management.    In the interim, continue to wear the sling.  Activity as tolerated.  I have prescribed you a short course of Dilaudid.  Please try to manage her symptoms with Tylenol and only use Dilaudid as needed for breakthrough pain.  Please note that this can make you drowsy. Exercise increased caution if using in conjunction with tizanidine.  Please also notify your primary care provider of today's ED encounter.  Return to the ER or seek immediate medical attention should you experience any new or worsening symptoms.

## 2020-07-17 ENCOUNTER — Encounter: Payer: Self-pay | Admitting: Nurse Practitioner

## 2020-07-18 ENCOUNTER — Other Ambulatory Visit: Payer: Medicare Other

## 2020-07-18 ENCOUNTER — Ambulatory Visit: Payer: Medicare Other

## 2020-07-18 ENCOUNTER — Telehealth: Payer: Self-pay | Admitting: Hematology

## 2020-07-18 DIAGNOSIS — H524 Presbyopia: Secondary | ICD-10-CM | POA: Diagnosis not present

## 2020-07-18 DIAGNOSIS — H35033 Hypertensive retinopathy, bilateral: Secondary | ICD-10-CM | POA: Diagnosis not present

## 2020-07-18 NOTE — Telephone Encounter (Signed)
R/s appts per 5/1 sch msg. Pt aware.

## 2020-07-19 ENCOUNTER — Encounter: Payer: Self-pay | Admitting: Physical Medicine & Rehabilitation

## 2020-07-19 ENCOUNTER — Other Ambulatory Visit: Payer: Self-pay

## 2020-07-19 ENCOUNTER — Other Ambulatory Visit: Payer: Medicare Other | Admitting: *Deleted

## 2020-07-19 ENCOUNTER — Encounter: Payer: Medicare Other | Attending: Physical Medicine & Rehabilitation | Admitting: Physical Medicine & Rehabilitation

## 2020-07-19 VITALS — BP 113/51 | HR 72 | Temp 99.1°F | Ht 64.0 in | Wt 195.0 lb

## 2020-07-19 DIAGNOSIS — R5381 Other malaise: Secondary | ICD-10-CM | POA: Insufficient documentation

## 2020-07-19 DIAGNOSIS — R600 Localized edema: Secondary | ICD-10-CM | POA: Diagnosis not present

## 2020-07-19 DIAGNOSIS — I10 Essential (primary) hypertension: Secondary | ICD-10-CM | POA: Diagnosis not present

## 2020-07-19 DIAGNOSIS — M48062 Spinal stenosis, lumbar region with neurogenic claudication: Secondary | ICD-10-CM

## 2020-07-19 DIAGNOSIS — I2510 Atherosclerotic heart disease of native coronary artery without angina pectoris: Secondary | ICD-10-CM

## 2020-07-19 MED ORDER — PREGABALIN 25 MG PO CAPS: 25.0000 mg | ORAL_CAPSULE | Freq: Two times a day (BID) | ORAL | 1 refills | Status: DC

## 2020-07-19 NOTE — Progress Notes (Signed)
Subjective:    Patient ID: [name], female    DOB: 22-Mar-1945, 76 y.o.   MRN: [id number]  HPI  75 year old female with chronic kidney disease, hypertension, coronary artery disease, diabetes with neuropathy and lumbar spinal stenosis who has had right lower extremity radicular pain that has responded to epidural injections at right L2-3, right L5-S1, right L4-5 and bilateral S1 in the past.  She is here for increasing right lower extremity pain.  She was restarted on Lyrica at 100 mg twice daily but became excessively drowsy on this.  This was her last dose before she was lost to follow-up about 1 year ago.  Her Lyrica dose was reduced to 50 mg twice daily and this is still effective for her pain although she does have some pain that is unrelieved by the Lyrica.  Her daughter is here with her today and feels that since restarting the Lyrica the patient has been asking questions repetitively.  Patient has had lower extremity swelling but it is unclear whether or not this increased since starting the Lyrica. Interval medical history fall while getting out of car causing left shoulder pain.  She was seen in the ED 07/14/2020 x-rays of the shoulder and humerus did not show any fractures.  Nevertheless she is unable to reach above her head due to pain in the shoulder area. RIght lower ext pain- relieved by rest   In terms of her lower extremity swelling, her cardiologist Dr. Johnsie Cancel advised increased furosemide dose  Left shoulder pain increased with overhead reaching, no numbness or tingling in the left hand no neck pain.   Had repeat lumbar MRI at [facility], with follow-up Dr. Ron Agee Pain Inventory Average Pain 10 Pain Right Now 6 My pain is intermittent, sharp, dull and aching  In the last 24 hours, has pain interfered with the following? General activity 1 Relation with others 0 Enjoyment of life 0 What TIME of day is your pain at its worst? evening Sleep (in general)  Fair  Pain is worse with: walking, bending, standing and some activites Pain improves with: rest, therapy/exercise and heat Relief from Meds: Medication make me think I am crazy.  Family History  Problem Relation Age of Onset  . Pancreatic cancer Brother 27  . Breast cancer Mother 35  . Lung cancer Maternal Aunt   . Breast cancer Maternal Aunt   . Prostate cancer Maternal Uncle   . Heart attack Maternal Grandmother   . Breast cancer Maternal Grandmother   . Colon cancer Maternal Grandmother   . Clotting disorder Maternal Grandmother   . Diabetes Maternal Grandmother   . Kidney failure Maternal Aunt   . Hypertension Daughter    Social History   Socioeconomic History  . Marital status: Legally Separated    Spouse name: Not on file  . Number of children: 5  . Years of education: Not on file  . Highest education level: Not on file  Occupational History  . Occupation: retired    Fish farm manager: [employer]  Tobacco Use  . Smoking status: Never Smoker  . Smokeless tobacco: Never Used  Vaping Use  . Vaping Use: Never used  Substance and Sexual Activity  . Alcohol use: No    Alcohol/week: 0.0 standard drinks  . Drug use: No  . Sexual activity: Not on file  Other Topics Concern  . Not on file  Social History Narrative   She is separated 2 sons 3 daughters   She has grandchildren and  great-grandchildren   She is retired   Never smoker no tobacco now no drugs no alcohol no caffeine   Social Determinants of Radio broadcast assistant Strain: Not on file  Food Insecurity: Not on file  Transportation Needs: Not on file  Physical Activity: Not on file  Stress: Not on file  Social Connections: Not on file   Past Surgical History:  Procedure Laterality Date  . ABDOMINAL HYSTERECTOMY    . BILATERAL SALPINGOOPHORECTOMY  8/03   Lap BSO (R ovarian fibroma) and adhesion lysis  . BOWEL RESECTION  2003   ileocolectomy with anastomosis 2/2 adhesions  . BREAST BIOPSY  Right 2015  . BREAST BIOPSY Right 07/2014  . BREAST LUMPECTOMY Right 04/22/2013  . BREAST LUMPECTOMY WITH NEEDLE LOCALIZATION AND AXILLARY SENTINEL LYMPH NODE BX Right 04/22/2013   Procedure: BREAST LUMPECTOMY WITH NEEDLE LOCALIZATION AND AXILLARY SENTINEL LYMPH NODE BX;  Surgeon: Stark Klein, MD;  Location: [facility];  Service: General;  Laterality: Right;  . CARDIAC CATHETERIZATION     2 stents  . CHOLECYSTECTOMY    . COLONOSCOPY    . HEMICOLECTOMY     R sided hemicolectomy  . HERNIA REPAIR     Ventral hernia repair  . PTCA  4/06   Past Surgical History:  Procedure Laterality Date  . ABDOMINAL HYSTERECTOMY    . BILATERAL SALPINGOOPHORECTOMY  8/03   Lap BSO (R ovarian fibroma) and adhesion lysis  . BOWEL RESECTION  2003   ileocolectomy with anastomosis 2/2 adhesions  . BREAST BIOPSY Right 2015  . BREAST BIOPSY Right 07/2014  . BREAST LUMPECTOMY Right 04/22/2013  . BREAST LUMPECTOMY WITH NEEDLE LOCALIZATION AND AXILLARY SENTINEL LYMPH NODE BX Right 04/22/2013   Procedure: BREAST LUMPECTOMY WITH NEEDLE LOCALIZATION AND AXILLARY SENTINEL LYMPH NODE BX;  Surgeon: Stark Klein, MD;  Location: [facility];  Service: General;  Laterality: Right;  . CARDIAC CATHETERIZATION     2 stents  . CHOLECYSTECTOMY    . COLONOSCOPY    . HEMICOLECTOMY     R sided hemicolectomy  . HERNIA REPAIR     Ventral hernia repair  . PTCA  4/06   Past Medical History:  Diagnosis Date  . Abdominal discomfort    Chronic N/V/D. Presumptive dx Crohn's dx per elevated p ANCA. Failed Entocort and Pentasa. Sep 2003 - ileocolectomy c anastomosis per Dr Deon Pilling 2/2 adhesions - path was hegative for Crohns. EGD, Sm bowel follow through (11/03), and an eteroclysis (10/03) were unrevealing. Cuases hypomag and hypocalcemia.  . Adnexal mass 8/03   s/p lap BSO (R ovarian fibroma) & lysis of adhesions  . Allergy    Seasonal  . Anemia    Multifactorial. Baseline HgB 10-11 ish. B12 def - 150  in 3/10. Fe Def - ferritin 35 3/10. Both are being repleted.  . Breast cancer ([place]) 03/16/13   right, 5 o'clock  . CAD (coronary artery disease) 1996   1996 - PTCA and angioplasty diagonal branch. 2000 - Rotoblator & angiopllasty of diagonal. 2006 - subendocardial AMI, DES to proximal LAD.Marland Kitchen Also had 90% stenosis in distal apical LAD. EF 55 with apical hypokinesis. Indefinite ASA and Plavix.  . CHF (congestive heart failure) ([place])   . Chronic kidney disease    Chronic renal insuff baseline Cr 1.2 - 1.4 ish.  . Chronic pain    CT 10/10 = Spinal stenosis L2 - S1.  . Diabetes mellitus    Insulin dependent  . Gout   . Hx  of radiation therapy 06/02/13- 07/16/13   right rbeast 4500 cGy 25 sessions, right breast boost 1600 cGy in 8 sessions  . Hyperlipidemia    Managed with both a statin and Welchol. Welchol stopped 2014 2/2 cost and started on fenofibrate   . Hypertension    2006 B renal arteries patent. 2003 MRA - no RAS. 2003 pheo W/U Dr Hassell Done reportedly negative.  . Hypoxia 07/23/2017  . Lupus ([place])   . Lymphedema of breast   . Personal history of radiation therapy 2015  . RBBB   . Renal vein thrombosis ([place])   . SBO (small bowel obstruction) ([place]) 09/17/2017  . Secondary hyperparathyroidism ([place])   . Stroke [name])    Incidental finding MRI 2002 L lacunar infarct  . Vitamin B12 deficiency   . Vitamin D deficiency   . Wears dentures    top   BP (!) 113/51   Pulse 72   Temp 99.1 F (37.3 C)   Ht 5' 4"  (1.626 m)   Wt 195 lb (88.5 kg)   SpO2 96%   BMI 33.47 kg/m   Opioid Risk Score:   Fall Risk Score:  `1  Depression screen PHQ 2/9  No flowsheet data found. Review of Systems  Musculoskeletal: Positive for back pain and gait problem.       Pain in the right leg and Left upper arm.  All other systems reviewed and are negative.      Objective:   Physical Exam Vitals and nursing note reviewed.  Constitutional:      Appearance: She is obese.  HENT:     Head: Normocephalic  and atraumatic.     Mouth/Throat:     Mouth: Mucous membranes are dry.  Eyes:     Extraocular Movements: Extraocular movements intact.     Conjunctiva/sclera: Conjunctivae normal.     Pupils: Pupils are equal, round, and reactive to light.  Musculoskeletal:     Cervical back: Normal range of motion and neck supple. No rigidity or tenderness.     Right lower leg: Edema present.     Left lower leg: Edema present.     Comments: Positive Michel Bickers on the left side, good passive external rotation There is no tenderness palpation around the [facility] joint or in the subacromial area.  No pain in the elbow or wrist area. There is mild tenderness palpation in the lumbar paraspinals bilaterally) left side  Skin:    General: Skin is warm and dry.     Comments: 2-3+ edema bilateral pretibial and ankle area  Neurological:     Mental Status: She is alert.     Cranial Nerves: No cranial nerve deficit, dysarthria or facial asymmetry.     Sensory: No sensory deficit.     Gait: Gait abnormal.     Comments: Motor strength is 5/5 in the right deltoid bicep tricep grip 3 - left deltoid 4 at the biceps triceps 5 at the grip 5/5 bilateral hip flexor knee extensor ankle dorsiflexor  Uses walker for ambulation   Psychiatric:        Mood and Affect: Mood normal.        Behavior: Behavior normal.           Assessment & Plan:  1.  Debility decline in function multifactorial.  We discussed the complex situation with the patient and her daughter. The patient does have underlying lumbar spinal stenosis and this certainly could be causing intermittent right lower extremity weakness and falls.  She does have a lumbar MRI but results are not available to me at this time.  Daughter will try to get them for me. We discussed that if her lumbar stenosis has progressed she may be a surgical candidate.  If she undergoes surgery she will likely need postoperative inpatient rehabilitation on inpatient rehab at  [facility].  She may benefit from lumbar injections and has had good relief from repeated epidural injections in the past however she is now on Eliquis for a calf DVT.  As discussed with the daughter sometimes resolve spontaneously and a repeat Doppler of the lower extremities may be helpful if epidural injections are planned.  For her decline in function I do think she would benefit from outpatient PT OT as well as speech evaluation.  It is unclear whether memory deficits are related to medication or may be more chronic.  We will try reducing the pregabalin dose to 25 twice daily.  2.  Left shoulder pain we will start OT, will also ask OT to assess and home functioning as this has been compromised by inability to lift left arm.  Concerned that she may have rotator cuff tear but certainly fairly acute at this point may be just a tendinopathy/tendinitis.  If no better in 1 month consider ultrasound versus MRI  Discussed considerations with patient and daughter at length, over 40-minute visit

## 2020-07-19 NOTE — Patient Instructions (Signed)
Neuro rehab will call you

## 2020-07-20 ENCOUNTER — Inpatient Hospital Stay (HOSPITAL_COMMUNITY)
Admission: EM | Admit: 2020-07-20 | Discharge: 2020-07-24 | DRG: 291 | Disposition: A | Discharge status: Home Health | Payer: Medicare Other | Attending: Internal Medicine | Admitting: Internal Medicine

## 2020-07-20 ENCOUNTER — Emergency Department (HOSPITAL_COMMUNITY): Payer: Medicare Other

## 2020-07-20 ENCOUNTER — Ambulatory Visit: Payer: Medicare Other | Attending: Orthopedic Surgery | Admitting: Physical Therapy

## 2020-07-20 ENCOUNTER — Other Ambulatory Visit: Payer: Self-pay

## 2020-07-20 ENCOUNTER — Telehealth: Payer: Self-pay | Admitting: Physical Therapy

## 2020-07-20 DIAGNOSIS — N2581 Secondary hyperparathyroidism of renal origin: Secondary | ICD-10-CM | POA: Diagnosis present

## 2020-07-20 DIAGNOSIS — Z6830 Body mass index (BMI) 30.0-30.9, adult: Secondary | ICD-10-CM | POA: Diagnosis not present

## 2020-07-20 DIAGNOSIS — E669 Obesity, unspecified: Secondary | ICD-10-CM | POA: Diagnosis present

## 2020-07-20 DIAGNOSIS — R252 Cramp and spasm: Secondary | ICD-10-CM | POA: Insufficient documentation

## 2020-07-20 DIAGNOSIS — I5030 Unspecified diastolic (congestive) heart failure: Secondary | ICD-10-CM | POA: Diagnosis not present

## 2020-07-20 DIAGNOSIS — Z9049 Acquired absence of other specified parts of digestive tract: Secondary | ICD-10-CM

## 2020-07-20 DIAGNOSIS — Z7901 Long term (current) use of anticoagulants: Secondary | ICD-10-CM | POA: Diagnosis not present

## 2020-07-20 DIAGNOSIS — Z79890 Hormone replacement therapy (postmenopausal): Secondary | ICD-10-CM

## 2020-07-20 DIAGNOSIS — Z79899 Other long term (current) drug therapy: Secondary | ICD-10-CM

## 2020-07-20 DIAGNOSIS — Z86718 Personal history of other venous thrombosis and embolism: Secondary | ICD-10-CM

## 2020-07-20 DIAGNOSIS — I10 Essential (primary) hypertension: Secondary | ICD-10-CM | POA: Diagnosis not present

## 2020-07-20 DIAGNOSIS — N189 Chronic kidney disease, unspecified: Secondary | ICD-10-CM

## 2020-07-20 DIAGNOSIS — I272 Pulmonary hypertension, unspecified: Secondary | ICD-10-CM | POA: Diagnosis present

## 2020-07-20 DIAGNOSIS — M5441 Lumbago with sciatica, right side: Secondary | ICD-10-CM | POA: Insufficient documentation

## 2020-07-20 DIAGNOSIS — K5090 Crohn's disease, unspecified, without complications: Secondary | ICD-10-CM | POA: Diagnosis not present

## 2020-07-20 DIAGNOSIS — Z9111 Patient's noncompliance with dietary regimen: Secondary | ICD-10-CM | POA: Diagnosis not present

## 2020-07-20 DIAGNOSIS — Z923 Personal history of irradiation: Secondary | ICD-10-CM | POA: Diagnosis not present

## 2020-07-20 DIAGNOSIS — N184 Chronic kidney disease, stage 4 (severe): Secondary | ICD-10-CM | POA: Diagnosis present

## 2020-07-20 DIAGNOSIS — Z885 Allergy status to narcotic agent status: Secondary | ICD-10-CM

## 2020-07-20 DIAGNOSIS — Z20822 Contact with and (suspected) exposure to covid-19: Secondary | ICD-10-CM | POA: Diagnosis present

## 2020-07-20 DIAGNOSIS — M25512 Pain in left shoulder: Secondary | ICD-10-CM | POA: Insufficient documentation

## 2020-07-20 DIAGNOSIS — I82452 Acute embolism and thrombosis of left peroneal vein: Secondary | ICD-10-CM | POA: Diagnosis present

## 2020-07-20 DIAGNOSIS — N281 Cyst of kidney, acquired: Secondary | ICD-10-CM | POA: Diagnosis not present

## 2020-07-20 DIAGNOSIS — N179 Acute kidney failure, unspecified: Secondary | ICD-10-CM | POA: Diagnosis present

## 2020-07-20 DIAGNOSIS — Z8249 Family history of ischemic heart disease and other diseases of the circulatory system: Secondary | ICD-10-CM

## 2020-07-20 DIAGNOSIS — E1122 Type 2 diabetes mellitus with diabetic chronic kidney disease: Secondary | ICD-10-CM | POA: Diagnosis present

## 2020-07-20 DIAGNOSIS — Z888 Allergy status to other drugs, medicaments and biological substances status: Secondary | ICD-10-CM

## 2020-07-20 DIAGNOSIS — Z955 Presence of coronary angioplasty implant and graft: Secondary | ICD-10-CM

## 2020-07-20 DIAGNOSIS — Z794 Long term (current) use of insulin: Secondary | ICD-10-CM

## 2020-07-20 DIAGNOSIS — E785 Hyperlipidemia, unspecified: Secondary | ICD-10-CM | POA: Diagnosis not present

## 2020-07-20 DIAGNOSIS — Z972 Presence of dental prosthetic device (complete) (partial): Secondary | ICD-10-CM

## 2020-07-20 DIAGNOSIS — R41841 Cognitive communication deficit: Secondary | ICD-10-CM | POA: Insufficient documentation

## 2020-07-20 DIAGNOSIS — M6281 Muscle weakness (generalized): Secondary | ICD-10-CM | POA: Insufficient documentation

## 2020-07-20 DIAGNOSIS — I2510 Atherosclerotic heart disease of native coronary artery without angina pectoris: Secondary | ICD-10-CM | POA: Diagnosis present

## 2020-07-20 DIAGNOSIS — M329 Systemic lupus erythematosus, unspecified: Secondary | ICD-10-CM | POA: Diagnosis present

## 2020-07-20 DIAGNOSIS — Z853 Personal history of malignant neoplasm of breast: Secondary | ICD-10-CM

## 2020-07-20 DIAGNOSIS — I5033 Acute on chronic diastolic (congestive) heart failure: Secondary | ICD-10-CM | POA: Diagnosis not present

## 2020-07-20 DIAGNOSIS — R0602 Shortness of breath: Secondary | ICD-10-CM | POA: Diagnosis not present

## 2020-07-20 DIAGNOSIS — R262 Difficulty in walking, not elsewhere classified: Secondary | ICD-10-CM | POA: Insufficient documentation

## 2020-07-20 DIAGNOSIS — I509 Heart failure, unspecified: Secondary | ICD-10-CM

## 2020-07-20 DIAGNOSIS — D649 Anemia, unspecified: Secondary | ICD-10-CM | POA: Diagnosis not present

## 2020-07-20 DIAGNOSIS — Z90710 Acquired absence of both cervix and uterus: Secondary | ICD-10-CM

## 2020-07-20 DIAGNOSIS — I110 Hypertensive heart disease with heart failure: Secondary | ICD-10-CM | POA: Diagnosis not present

## 2020-07-20 DIAGNOSIS — I130 Hypertensive heart and chronic kidney disease with heart failure and stage 1 through stage 4 chronic kidney disease, or unspecified chronic kidney disease: Secondary | ICD-10-CM | POA: Diagnosis not present

## 2020-07-20 DIAGNOSIS — D631 Anemia in chronic kidney disease: Secondary | ICD-10-CM

## 2020-07-20 DIAGNOSIS — Z803 Family history of malignant neoplasm of breast: Secondary | ICD-10-CM

## 2020-07-20 DIAGNOSIS — M109 Gout, unspecified: Secondary | ICD-10-CM | POA: Diagnosis not present

## 2020-07-20 DIAGNOSIS — Z833 Family history of diabetes mellitus: Secondary | ICD-10-CM

## 2020-07-20 DIAGNOSIS — M25511 Pain in right shoulder: Secondary | ICD-10-CM | POA: Insufficient documentation

## 2020-07-20 LAB — BRAIN NATRIURETIC PEPTIDE: B Natriuretic Peptide: 195 pg/mL — ABNORMAL HIGH (ref 0.0–100.0)

## 2020-07-20 LAB — CBC WITH DIFFERENTIAL/PLATELET
Abs Immature Granulocytes: 0.48 10*3/uL — ABNORMAL HIGH (ref 0.00–0.07)
Basophils Absolute: 0 10*3/uL (ref 0.0–0.1)
Basophils Relative: 0 %
Eosinophils Absolute: 0 10*3/uL (ref 0.0–0.5)
Eosinophils Relative: 0 %
HCT: 27.2 % — ABNORMAL LOW (ref 36.0–46.0)
Hemoglobin: 8.2 g/dL — ABNORMAL LOW (ref 12.0–15.0)
Immature Granulocytes: 5 %
Lymphocytes Relative: 14 %
Lymphs Abs: 1.5 10*3/uL (ref 0.7–4.0)
MCH: 28.4 pg (ref 26.0–34.0)
MCHC: 30.1 g/dL (ref 30.0–36.0)
MCV: 94.1 fL (ref 80.0–100.0)
Monocytes Absolute: 0.7 10*3/uL (ref 0.1–1.0)
Monocytes Relative: 6 %
Neutro Abs: 8.1 10*3/uL — ABNORMAL HIGH (ref 1.7–7.7)
Neutrophils Relative %: 75 %
Platelets: 213 10*3/uL (ref 150–400)
RBC: 2.89 MIL/uL — ABNORMAL LOW (ref 3.87–5.11)
RDW: 15.2 % (ref 11.5–15.5)
WBC: 10.8 10*3/uL — ABNORMAL HIGH (ref 4.0–10.5)
nRBC: 0.2 % (ref 0.0–0.2)

## 2020-07-20 LAB — BASIC METABOLIC PANEL
BUN/Creatinine Ratio: 32 — ABNORMAL HIGH (ref 12–28)
BUN: 85 mg/dL (ref 8–27)
CO2: 18 mmol/L — ABNORMAL LOW (ref 20–29)
Calcium: 8.6 mg/dL — ABNORMAL LOW (ref 8.7–10.3)
Chloride: 108 mmol/L — ABNORMAL HIGH (ref 96–106)
Creatinine, Ser: 2.66 mg/dL — ABNORMAL HIGH (ref 0.57–1.00)
Glucose: 135 mg/dL — ABNORMAL HIGH (ref 65–99)
Potassium: 5.1 mmol/L (ref 3.5–5.2)
Sodium: 144 mmol/L (ref 134–144)
eGFR: 18 mL/min/{1.73_m2} — ABNORMAL LOW (ref >59)

## 2020-07-20 LAB — URIC ACID: Uric Acid, Serum: 3.6 mg/dL (ref 2.5–7.1)

## 2020-07-20 LAB — RESP PANEL BY RT-PCR (FLU A&B, COVID) ARPGX2
Influenza A by PCR: NEGATIVE
Influenza B by PCR: NEGATIVE
SARS Coronavirus 2 by RT PCR: NEGATIVE

## 2020-07-20 LAB — COMPREHENSIVE METABOLIC PANEL
ALT: 35 U/L (ref 0–44)
AST: 22 U/L (ref 15–41)
Albumin: 3 g/dL — ABNORMAL LOW (ref 3.5–5.0)
Alkaline Phosphatase: 58 U/L (ref 38–126)
Anion gap: 9 (ref 5–15)
BUN: 97 mg/dL — ABNORMAL HIGH (ref 8–23)
CO2: 20 mmol/L — ABNORMAL LOW (ref 22–32)
Calcium: 8.7 mg/dL — ABNORMAL LOW (ref 8.9–10.3)
Chloride: 112 mmol/L — ABNORMAL HIGH (ref 98–111)
Creatinine, Ser: 3 mg/dL — ABNORMAL HIGH (ref 0.44–1.00)
GFR, Estimated: 16 mL/min — ABNORMAL LOW (ref >60)
Glucose, Bld: 136 mg/dL — ABNORMAL HIGH (ref 70–99)
Potassium: 5 mmol/L (ref 3.5–5.1)
Sodium: 141 mmol/L (ref 135–145)
Total Bilirubin: 0.6 mg/dL (ref 0.3–1.2)
Total Protein: 6.1 g/dL — ABNORMAL LOW (ref 6.5–8.1)

## 2020-07-20 LAB — URINALYSIS, COMPLETE (UACMP) WITH MICROSCOPIC
Bilirubin Urine: NEGATIVE
Glucose, UA: NEGATIVE mg/dL
Hgb urine dipstick: NEGATIVE
Ketones, ur: NEGATIVE mg/dL
Leukocytes,Ua: NEGATIVE
Nitrite: NEGATIVE
Protein, ur: NEGATIVE mg/dL
Specific Gravity, Urine: 1.009 (ref 1.005–1.030)
pH: 5 (ref 5.0–8.0)

## 2020-07-20 LAB — TROPONIN I (HIGH SENSITIVITY)
Troponin I (High Sensitivity): 11 ng/L (ref <18)
Troponin I (High Sensitivity): 11 ng/L (ref <18)

## 2020-07-20 LAB — SODIUM, URINE, RANDOM: Sodium, Ur: 90 mmol/L

## 2020-07-20 LAB — MAGNESIUM: Magnesium: 1.3 mg/dL — ABNORMAL LOW (ref 1.7–2.4)

## 2020-07-20 LAB — CREATININE, URINE, RANDOM: Creatinine, Urine: 39.27 mg/dL

## 2020-07-20 MED ORDER — LINAGLIPTIN 5 MG PO TABS
5.0000 mg | ORAL_TABLET | Freq: Every day | ORAL | Status: DC
  Administered 2020-07-21 – 2020-07-24 (×4): 5 mg via ORAL
  Filled 2020-07-20 (×4): qty 1

## 2020-07-20 MED ORDER — AMLODIPINE BESYLATE 10 MG PO TABS
10.0000 mg | ORAL_TABLET | Freq: Every day | ORAL | Status: DC
  Administered 2020-07-21 – 2020-07-23 (×3): 10 mg via ORAL
  Filled 2020-07-20 (×3): qty 1

## 2020-07-20 MED ORDER — ISOSORBIDE MONONITRATE ER 30 MG PO TB24
15.0000 mg | ORAL_TABLET | Freq: Every day | ORAL | Status: DC
  Administered 2020-07-21 – 2020-07-24 (×4): 15 mg via ORAL
  Filled 2020-07-20 (×4): qty 1

## 2020-07-20 MED ORDER — COLESTIPOL HCL 1 G PO TABS
2.0000 g | ORAL_TABLET | Freq: Every day | ORAL | Status: DC
  Administered 2020-07-21 – 2020-07-22 (×2): 2 g via ORAL
  Filled 2020-07-20 (×4): qty 2

## 2020-07-20 MED ORDER — NEBIVOLOL HCL 10 MG PO TABS
10.0000 mg | ORAL_TABLET | Freq: Every day | ORAL | Status: DC
  Administered 2020-07-21 – 2020-07-24 (×4): 10 mg via ORAL
  Filled 2020-07-20 (×4): qty 1

## 2020-07-20 MED ORDER — SODIUM CHLORIDE 0.9 % IV SOLN: 250.0000 mL | INTRAVENOUS | Status: DC | PRN

## 2020-07-20 MED ORDER — FUROSEMIDE 10 MG/ML IJ SOLN: 40.0000 mg | Freq: Every day | INTRAMUSCULAR | Status: DC

## 2020-07-20 MED ORDER — MELATONIN 5 MG PO TABS
10.0000 mg | ORAL_TABLET | Freq: Every day | ORAL | Status: DC
  Administered 2020-07-21 – 2020-07-22 (×3): 10 mg via ORAL
  Filled 2020-07-20 (×4): qty 2

## 2020-07-20 MED ORDER — FUROSEMIDE 10 MG/ML IJ SOLN
80.0000 mg | Freq: Two times a day (BID) | INTRAMUSCULAR | Status: DC
  Administered 2020-07-20 – 2020-07-21 (×3): 80 mg via INTRAVENOUS
  Filled 2020-07-20 (×3): qty 8

## 2020-07-20 MED ORDER — MAGNESIUM SULFATE 2 GM/50ML IV SOLN
2.0000 g | Freq: Once | INTRAVENOUS | Status: AC
  Administered 2020-07-20: 2 g via INTRAVENOUS
  Filled 2020-07-20: qty 50

## 2020-07-20 MED ORDER — PANTOPRAZOLE SODIUM 40 MG PO TBEC
40.0000 mg | DELAYED_RELEASE_TABLET | Freq: Every day | ORAL | Status: DC
  Administered 2020-07-21 – 2020-07-24 (×4): 40 mg via ORAL
  Filled 2020-07-20 (×4): qty 1

## 2020-07-20 MED ORDER — PREGABALIN 25 MG PO CAPS
25.0000 mg | ORAL_CAPSULE | Freq: Two times a day (BID) | ORAL | Status: DC
  Administered 2020-07-21 – 2020-07-24 (×7): 25 mg via ORAL
  Filled 2020-07-20 (×8): qty 1

## 2020-07-20 MED ORDER — HYDROMORPHONE HCL 2 MG PO TABS
2.0000 mg | ORAL_TABLET | Freq: Four times a day (QID) | ORAL | Status: DC | PRN
  Administered 2020-07-21 – 2020-07-22 (×4): 2 mg via ORAL
  Filled 2020-07-20 (×4): qty 1

## 2020-07-20 MED ORDER — ACETAMINOPHEN 500 MG PO TABS
1000.0000 mg | ORAL_TABLET | Freq: Once | ORAL | Status: AC
  Administered 2020-07-20: 1000 mg via ORAL
  Filled 2020-07-20: qty 2

## 2020-07-20 MED ORDER — FUROSEMIDE 10 MG/ML IJ SOLN
40.0000 mg | Freq: Once | INTRAMUSCULAR | Status: AC
  Administered 2020-07-20: 40 mg via INTRAVENOUS
  Filled 2020-07-20: qty 4

## 2020-07-20 MED ORDER — ACETAMINOPHEN 325 MG PO TABS
650.0000 mg | ORAL_TABLET | ORAL | Status: DC | PRN
  Administered 2020-07-21: 650 mg via ORAL
  Filled 2020-07-20: qty 2

## 2020-07-20 MED ORDER — SODIUM CHLORIDE 0.9% FLUSH
3.0000 mL | Freq: Two times a day (BID) | INTRAVENOUS | Status: DC
  Administered 2020-07-20 – 2020-07-24 (×8): 3 mL via INTRAVENOUS

## 2020-07-20 MED ORDER — BUDESONIDE 3 MG PO CPEP
3.0000 mg | ORAL_CAPSULE | Freq: Every day | ORAL | Status: DC
  Administered 2020-07-21 – 2020-07-24 (×4): 3 mg via ORAL
  Filled 2020-07-20 (×4): qty 1

## 2020-07-20 MED ORDER — FERROUS FUMARATE 324 (106 FE) MG PO TABS
1.0000 | ORAL_TABLET | Freq: Every day | ORAL | Status: DC
  Administered 2020-07-21 – 2020-07-24 (×4): 106 mg via ORAL
  Filled 2020-07-20 (×4): qty 1

## 2020-07-20 MED ORDER — ONDANSETRON HCL 4 MG/2ML IJ SOLN
4.0000 mg | Freq: Four times a day (QID) | INTRAMUSCULAR | Status: DC | PRN
Start: 2020-07-20 — End: 2020-07-24

## 2020-07-20 MED ORDER — ALLOPURINOL 100 MG PO TABS
50.0000 mg | ORAL_TABLET | ORAL | Status: DC
  Administered 2020-07-21 – 2020-07-23 (×3): 50 mg via ORAL
  Filled 2020-07-20 (×3): qty 1

## 2020-07-20 MED ORDER — ATORVASTATIN CALCIUM 40 MG PO TABS
40.0000 mg | ORAL_TABLET | Freq: Every day | ORAL | Status: DC
  Administered 2020-07-21 – 2020-07-24 (×4): 40 mg via ORAL
  Filled 2020-07-20 (×4): qty 1

## 2020-07-20 MED ORDER — HYDRALAZINE HCL 25 MG PO TABS
100.0000 mg | ORAL_TABLET | Freq: Three times a day (TID) | ORAL | Status: DC
  Administered 2020-07-20 – 2020-07-24 (×10): 100 mg via ORAL
  Filled 2020-07-20 (×11): qty 4

## 2020-07-20 MED ORDER — APIXABAN 5 MG PO TABS
5.0000 mg | ORAL_TABLET | Freq: Two times a day (BID) | ORAL | Status: DC
  Administered 2020-07-20 – 2020-07-24 (×7): 5 mg via ORAL
  Filled 2020-07-20 (×8): qty 1

## 2020-07-20 MED ORDER — SODIUM CHLORIDE 0.9% FLUSH: 3.0000 mL | INTRAVENOUS | Status: DC | PRN

## 2020-07-20 NOTE — H&P (Signed)
History and Physical    [name] LDJ:[id number] DOB: 03-13-1946 DOA: 07/20/2020  PCP: Seward Carol, MD (Confirm with patient/family/NH records and if not entered, this has to be entered at [facility] point of entry) Patient coming from: Home  I have personally briefly reviewed patient's old medical records in [place]  Chief Complaint: SOB and swelling  HPI: [name] is a 75 y.o. female with medical history significant of CKD stage IV, chronic diastolic CHF, IIDM, CAD, LLE DVT on Eliquis, pulmonary hypertension, HTN, Gour, Crohn's disease, chronic iron deficiency anemia and chronic anemia secondary to CKD, presented with increasing shortness of breath and leg swelling.  Patient was hospitalized 3 weeks ago for fluid overload, was found to have acute edbat&\spelled Eliquis was started.  During the same hospitalization, patient's torsemide dosage also increased from alternating 20/40 to 40 mg daily.  Family reported the increased dosage of torsemide regimen worked well for patient for about a week.  Starting from 2 weeks ago, patient started noticed decreased urine output and same time she started to feel increasing shortness of breath and leg swelling.  She estimated she gained about 10 pounds compared to 3 weeks ago.  Family also reported that patient has not been strictly compliant with her salt and fluid intake, "has been snaking more".  This week, patient became more lethargic and developed episodic confusion.  Denies any chest pain, no cough no fever chills no dysuria no back pains. ED Course: Significant fluid overload.  Kidney function showed worsening of uremia and azotemia.  Review of Systems: As per HPI otherwise 14 point review of systems negative.    Past Medical History:  Diagnosis Date  . Abdominal discomfort    Chronic N/V/D. Presumptive dx Crohn's dx per elevated p ANCA. Failed Entocort and Pentasa. Sep 2003 - ileocolectomy c anastomosis per Dr Deon Pilling 2/2  adhesions - path was hegative for Crohns. EGD, Sm bowel follow through (11/03), and an eteroclysis (10/03) were unrevealing. Cuases hypomag and hypocalcemia.  . Adnexal mass 8/03   s/p lap BSO (R ovarian fibroma) & lysis of adhesions  . Allergy    Seasonal  . Anemia    Multifactorial. Baseline HgB 10-11 ish. B12 def - 150 in 3/10. Fe Def - ferritin 35 3/10. Both are being repleted.  . Breast cancer ([place]) 03/16/13   right, 5 o'clock  . CAD (coronary artery disease) 1996   1996 - PTCA and angioplasty diagonal branch. 2000 - Rotoblator & angiopllasty of diagonal. 2006 - subendocardial AMI, DES to proximal LAD.Marland Kitchen Also had 90% stenosis in distal apical LAD. EF 55 with apical hypokinesis. Indefinite ASA and Plavix.  . CHF (congestive heart failure) ([place])   . Chronic kidney disease    Chronic renal insuff baseline Cr 1.2 - 1.4 ish.  . Chronic pain    CT 10/10 = Spinal stenosis L2 - S1.  . Diabetes mellitus    Insulin dependent  . Gout   . Hx of radiation therapy 06/02/13- 07/16/13   right rbeast 4500 cGy 25 sessions, right breast boost 1600 cGy in 8 sessions  . Hyperlipidemia    Managed with both a statin and Welchol. Welchol stopped 2014 2/2 cost and started on fenofibrate   . Hypertension    2006 B renal arteries patent. 2003 MRA - no RAS. 2003 pheo W/U Dr Hassell Done reportedly negative.  . Hypoxia 07/23/2017  . Lupus ([place])   . Lymphedema of breast   . Personal history of radiation therapy 2015  .  RBBB   . Renal vein thrombosis ([place])   . SBO (small bowel obstruction) ([place]) 09/17/2017  . Secondary hyperparathyroidism ([place])   . Stroke [facility])    Incidental finding MRI 2002 L lacunar infarct  . Vitamin B12 deficiency   . Vitamin D deficiency   . Wears dentures    top    Past Surgical History:  Procedure Laterality Date  . ABDOMINAL HYSTERECTOMY    . BILATERAL SALPINGOOPHORECTOMY  8/03   Lap BSO (R ovarian fibroma) and adhesion lysis  . BOWEL RESECTION  2003   ileocolectomy with anastomosis  2/2 adhesions  . BREAST BIOPSY Right 2015  . BREAST BIOPSY Right 07/2014  . BREAST LUMPECTOMY Right 04/22/2013  . BREAST LUMPECTOMY WITH NEEDLE LOCALIZATION AND AXILLARY SENTINEL LYMPH NODE BX Right 04/22/2013   Procedure: BREAST LUMPECTOMY WITH NEEDLE LOCALIZATION AND AXILLARY SENTINEL LYMPH NODE BX;  Surgeon: Stark Klein, MD;  Location: [place];  Service: General;  Laterality: Right;  . CARDIAC CATHETERIZATION     2 stents  . CHOLECYSTECTOMY    . COLONOSCOPY    . HEMICOLECTOMY     R sided hemicolectomy  . HERNIA REPAIR     Ventral hernia repair  . PTCA  4/06     reports that she has never smoked. She has never used smokeless tobacco. She reports that she does not drink alcohol and does not use drugs.  Allergies  Allergen Reactions  . Percocet [Oxycodone-Acetaminophen] Other (See Comments)    headaches  . Diazepam Other (See Comments)    REACTION: Agitation Other reaction(s): agitation  . Haloperidol Lactate Nausea And Vomiting  . Lorazepam Nausea Only    Other reaction(s): nausea  . Morphine Sulfate Other (See Comments)    REACTION: Agitation  . Propoxyphene Hcl Nausea And Vomiting  . Tramadol Hcl Nausea And Vomiting  . Anesthetics, Halogenated     Per patient's daughter, she can not tolerate anesthetics.   . Etomidate     Heart stopped during anesthesia  . Fentanyl     Heart stopped during anesthesia   . Haloperidol     Other reaction(s): nausea & vomitin  . Morphine Sulfate     Other reaction(s): nausea  . Oxycodone-Acetaminophen     Other reaction(s): headaches  . Propoxyphene     Other reaction(s): nausea & vomiting  . Tramadol Hcl     Other reaction(s): nausea & vomitin Other reaction(s): nausea & vomitin  . Versed [Midazolam]     Heart stopped during anesthesia    Family History  Problem Relation Age of Onset  . Pancreatic cancer Brother 76  . Breast cancer Mother 3  . Lung cancer Maternal Aunt   . Breast cancer Maternal Aunt   .  Prostate cancer Maternal Uncle   . Heart attack Maternal Grandmother   . Breast cancer Maternal Grandmother   . Colon cancer Maternal Grandmother   . Clotting disorder Maternal Grandmother   . Diabetes Maternal Grandmother   . Kidney failure Maternal Aunt   . Hypertension Daughter      Prior to Admission medications   Medication Sig Start Date End Date Taking? Authorizing Provider  allopurinol (ZYLOPRIM) 100 MG tablet Take 0.5 tablets (50 mg total) by mouth every other day. 07/01/20  Yes Mercy Riding, MD  amLODipine (NORVASC) 10 MG tablet Take 1 tablet (10 mg total) by mouth daily. 08/22/17  Yes Angiulli, Lavon Paganini, PA-C  apixaban (ELIQUIS) 5 MG TABS tablet Take 1 tablet (5 mg total)  by mouth 2 (two) times daily. Start after you finish the starter pack 08/01/20  Yes Gonfa, Taye T, MD  atorvastatin (LIPITOR) 40 MG tablet Take 40 mg by mouth daily. 07/27/19  Yes [provider]  colestipol (COLESTID) 1 g tablet Take 2 g by mouth at bedtime. 03/17/19  Yes [provider]  Cyanocobalamin (B-12 COMPLIANCE INJECTION IJ) Inject 1 vial as directed every 30 (thirty) days.   Yes [provider]  Epoetin Alfa-epbx (RETACRIT IJ) Inject 1 vial as directed every 30 (thirty) days.   Yes [provider]  ferrous fumarate (HEMOCYTE - 106 MG FE) 325 (106 Fe) MG TABS tablet Take 325 mg of iron by mouth daily.   Yes [provider]  hydrALAZINE (APRESOLINE) 100 MG tablet TAKE 1 TABLET BY MOUTH 3  TIMES DAILY Patient taking differently: Take 100 mg by mouth 3 (three) times daily. 01/20/20  Yes Josue Hector, MD  HYDROmorphone (DILAUDID) 2 MG tablet Take 1 tablet (2 mg total) by mouth every 6 (six) hours as needed for severe pain. 07/14/20  Yes Corena Herter, PA-C  isosorbide mononitrate (IMDUR) 30 MG 24 hr tablet TAKE 1/2 (ONE-HALF) TABLET BY MOUTH ONCE DAILY (PLEASE  SCHEDULE  AN  APPOINTMENT  FOR  FURTHER  REFILLS) Patient taking differently: Take 15 mg by mouth  daily. 07/12/20  Yes Isaiah Serge, NP  linagliptin (TRADJENTA) 5 MG TABS tablet Take 5 mg by mouth daily.   Yes [provider]  Melatonin 5 MG TABS Take 10 mg by mouth at bedtime.   Yes [provider]  nebivolol (BYSTOLIC) 10 MG tablet Take 1 tablet (10 mg total) by mouth daily. 06/30/20  Yes Mercy Riding, MD  nitroGLYCERIN (NITROSTAT) 0.4 MG SL tablet DISSOLVE 1 TABLET UNDER THE TONGUE EVERY 5 MINUTES AS  NEEDED FOR CHEST PAIN. MAX  OF 3 TABLETS IN 15 MINUTES. CALL 911 IF PAIN PERSISTS. Patient taking differently: Place 0.4 mg under the tongue every 5 (five) minutes as needed for chest pain. 10/28/19  Yes Josue Hector, MD  pantoprazole (PROTONIX) 40 MG tablet Take 1 tablet (40 mg total) by mouth daily before breakfast. 08/27/19  Yes Gatha Mayer, MD  pregabalin (LYRICA) 25 MG capsule Take 1 capsule (25 mg total) by mouth 2 (two) times daily. 07/19/20  Yes Kirsteins, Luanna Salk, MD  torsemide (DEMADEX) 20 MG tablet Take 2 tablets (40 mg total) by mouth daily. 07/11/20  Yes Josue Hector, MD  budesonide (ENTOCORT EC) 3 MG 24 hr capsule Take 1 capsule by mouth once daily Patient taking differently: Take 3 mg by mouth daily. 04/06/20   Gatha Mayer, MD    Physical Exam: Vitals:   07/20/20 1524 07/20/20 1715 07/20/20 1730 07/20/20 1815  BP:  130/66 (!) 122/50 137/66  Pulse:  69 62 64  Resp:  15 15 12   Temp:      TempSrc:      SpO2:  99% 99% 99%  Weight: 88.9 kg     Height: 5' 4"  (1.626 m)       Constitutional: NAD, calm, comfortable Vitals:   07/20/20 1524 07/20/20 1715 07/20/20 1730 07/20/20 1815  BP:  130/66 (!) 122/50 137/66  Pulse:  69 62 64  Resp:  15 15 12   Temp:      TempSrc:      SpO2:  99% 99% 99%  Weight: 88.9 kg     Height: 5' 4"  (1.626 m)  Eyes: PERRL, lids and conjunctivae normal ENMT: Mucous membranes are moist. Posterior pharynx clear of any exudate or lesions.Normal dentition.  Neck: normal, supple, no masses, no  thyromegaly Respiratory: clear to auscultation bilaterally, no wheezing, fine crackles bilateral bases. Normal respiratory effort. No accessory muscle use.  Cardiovascular: Regular rate and rhythm, no murmurs / rubs / gallops. 2+ extremity edema. 2+ pedal pulses. No carotid bruits.  Abdomen: no tenderness, no masses palpated. No hepatosplenomegaly. Bowel sounds positive.  Musculoskeletal: no clubbing / cyanosis. No joint deformity upper and lower extremities. Good ROM, no contractures. Normal muscle tone.  Skin: no rashes, lesions, ulcers. No induration Neurologic: CN 2-12 grossly intact. Sensation intact, DTR normal. Strength 5/5 in all 4.  Psychiatric: Normal judgment and insight. Alert and oriented x 3. Normal mood.     Labs on Admission: I have personally reviewed following labs and imaging studies  CBC: Recent Labs  Lab 07/14/20 1400 07/20/20 1530  WBC 8.3 10.8*  NEUTROABS 6.2 8.1*  HGB 9.9* 8.2*  HCT 31.0* 27.2*  MCV 93.1 94.1  PLT 246 308   Basic Metabolic Panel: Recent Labs  Lab 07/14/20 1400 07/19/20 1458 07/20/20 1530 07/20/20 1614  NA 135 144 141  --   K 4.5 5.1 5.0  --   CL 105 108* 112*  --   CO2 20* 18* 20*  --   GLUCOSE 163* 135* 136*  --   BUN 72* 85* 97*  --   CREATININE 2.55* 2.66* 3.00*  --   CALCIUM 8.5* 8.6* 8.7*  --   MG  --   --   --  1.3*   GFR: Estimated Creatinine Clearance: 18 mL/min (A) (by C-G formula based on SCr of 3 mg/dL (H)). Liver Function Tests: Recent Labs  Lab 07/20/20 1530  AST 22  ALT 35  ALKPHOS 58  BILITOT 0.6  PROT 6.1*  ALBUMIN 3.0*   No results for input(s): LIPASE, AMYLASE in the last 168 hours. No results for input(s): AMMONIA in the last 168 hours. Coagulation Profile: No results for input(s): INR, PROTIME in the last 168 hours. Cardiac Enzymes: No results for input(s): CKTOTAL, CKMB, CKMBINDEX, TROPONINI in the last 168 hours. BNP (last 3 results) No results for input(s): PROBNP in the last 8760  hours. HbA1C: No results for input(s): HGBA1C in the last 72 hours. CBG: No results for input(s): GLUCAP in the last 168 hours. Lipid Profile: No results for input(s): CHOL, HDL, LDLCALC, TRIG, CHOLHDL, LDLDIRECT in the last 72 hours. Thyroid Function Tests: No results for input(s): TSH, T4TOTAL, FREET4, T3FREE, THYROIDAB in the last 72 hours. Anemia Panel: No results for input(s): VITAMINB12, FOLATE, FERRITIN, TIBC, IRON, RETICCTPCT in the last 72 hours. Urine analysis:    Component Value Date/Time   COLORURINE YELLOW 06/28/2020 2353   APPEARANCEUR CLEAR 06/28/2020 2353   APPEARANCEUR Clear 08/22/2018 1620   LABSPEC 1.013 06/28/2020 2353   LABSPEC 1.020 03/03/2014 1341   PHURINE 5.0 06/28/2020 2353   GLUCOSEU NEGATIVE 06/28/2020 2353   GLUCOSEU Negative 03/03/2014 1341   HGBUR NEGATIVE 06/28/2020 2353   BILIRUBINUR NEGATIVE 06/28/2020 2353   BILIRUBINUR Negative 08/22/2018 1620   BILIRUBINUR Negative 03/03/2014 1341   KETONESUR NEGATIVE 06/28/2020 2353   PROTEINUR TRACE (A) 06/28/2020 2353   UROBILINOGEN 0.2 03/03/2014 1341   NITRITE NEGATIVE 06/28/2020 2353   LEUKOCYTESUR NEGATIVE 06/28/2020 2353   LEUKOCYTESUR Negative 03/03/2014 1341    Radiological Exams on Admission: DG Chest 2 View  Result Date: 07/20/2020 CLINICAL DATA:  Shortness of  breath EXAM: CHEST - 2 VIEW COMPARISON:  June 28, 2020 FINDINGS: Lungs are clear. Heart is mildly enlarged with pulmonary vascularity normal. No adenopathy. Stent noted in left anterior descending coronary artery. There are surgical clips in the right upper quadrant of the abdomen as well as in the right breast and right axillary regions. There is upper lumbar levoscoliosis. IMPRESSION: Heart mildly enlarged. No edema or airspace opacity. Stent in left anterior descending coronary artery. Areas of postoperative change. Electronically Signed   By: Lowella Grip III M.D.   On: 07/20/2020 16:06    EKG: Independently reviewed.  Chronic  RBBB  Assessment/Plan Active Problems:   AKI (acute kidney injury) ([place])   CHF (congestive heart failure) ([place])  (please populate well all problems here in Problem List. (For example, if patient is on BP meds at home and you resume or decide to hold them, it is a problem that needs to be her. Same for CAD, COPD, HLD and so on)  Acute on chronic diastolic CHF decompensation -Significant fluid overload.  Probably secondary from worsening of CKD as well as noncompliant with salt and fluid intake. -Increase to Lasix 77m daily  AKI ON CKD stage IV, with worsening of uremia and azotemia -Fluid overload, concern about cardiorenal syndrome, but straight worsening of intrinsic kidney disease cannot be ruled out, consult nephrologist. -Renal ultrasound to rule out postrenal obstruction. -Check uric acid level to adjust her allopurinol dosage. -Patient sick, although no immediate indication for dialysis, but long-term prognosis guarded.  Worsening of chronic anemia -Denied any obvious bleeding. -Iron level and reticulocyte count.  DVT -Continue Eliquis  HTN -Appears to be fairly controlled  IDDM -Continue Tradjenta, add sliding scale  Crohn's disease -Continue Entocort.  DVT prophylaxis: Eliquis Code Status: Full Code Family Communication: Daughter and grand daughter at bedside Disposition Plan: Patient sick, expect more than 2 midnight hospital stay, add PT evaluation, expect SNF discharge. Consults called: Neprho Admission status: PCU   PLequita HaltMD [facility] Pager [phone] 07/20/2020, 7:13 PM

## 2020-07-20 NOTE — ED Notes (Signed)
RN messaged pharmacy for sending/verfication of due furosemide

## 2020-07-20 NOTE — ED Triage Notes (Signed)
Pt arrives with family member who reports 10-15 lb weight gain in the last week. Endorses shob, BLE edema, and diarrhea.

## 2020-07-20 NOTE — ED Provider Notes (Signed)
Emergency Medicine Provider Triage Evaluation Note  [name] 75 y.o. female was evaluated in triage.  Pt complains of SOB and BLE edema.  History of heart failure (follows with Dr. Johnsie Cancel).  She reports that over the last week or so, she has had increased bilateral lower extremity edema.  She reports that her last 2 to 3 days, she has had shortness of breath.  She is on Lasix and states she has been taking her doses and have not missed any medication.  She feels like over the last week and a week and a half, she has gained about 10 pounds.  She occasionally reports some chest heaviness.  Denies any fevers, cough, abdominal pain, nausea/vomiting.  Review of Systems  Positive: SOB, BLE edema  Negative: Cough, fever   Physical Exam  BP 134/82   Pulse 70   Temp 98.2 F (36.8 C) (Oral)   Resp 18   Ht 5' 4"  (1.626 m)   Wt 65.8 kg   SpO2 100%   BMI 24.89 kg/m  Gen:   Awake, no distress  HEENT:  Atraumatic  Resp:  Normal effort. Able to speak in full sentences without any difficulty.  Cardiac:  Normal rate  Abd:   Nondistended, nontender MSK:   Moves extremities without difficulty. 2+ pitting edema noted to BLE Neuro:  Speech clear   Medical Decision Making  Medically screening exam initiated at 3:55 AM.  Appropriate orders placed.  Alexzandra [name] Phimmasone was informed that the remainder of the evaluation will be completed by another provider, this initial triage assessment does not replace that evaluation, and the importance of remaining in the ED until their evaluation is complete.   Clinical Impression  SOB, EDEMA   Portions of this note were generated with Dragon dictation software. Dictation errors may occur despite best attempts at proofreading.     Volanda Napoleon, PA-C 07/20/20 1525    Arnaldo Natal, MD 07/20/20 [phone]

## 2020-07-20 NOTE — ED Provider Notes (Signed)
[facility] EMERGENCY DEPARTMENT Provider Note   CSN: [id number] Arrival date & time: 07/20/20  1512     History No chief complaint on file.   [name] is a 75 y.o. female.  Pt presents to the ED today with sob.  The pt said she has been sob for the last week.  She has had about a 10-15 lb weight gain in that time.  Pt has had diarrhea as well.  Pt does have a hx of CHF and CKD.  She was admitted from 4/12-15 for the same.  She was found to have a DVT then for which she was put on Eliquis.  Pt has been compliant with her meds.        Past Medical History:  Diagnosis Date  . Abdominal discomfort    Chronic N/V/D. Presumptive dx Crohn's dx per elevated p ANCA. Failed Entocort and Pentasa. Sep 2003 - ileocolectomy c anastomosis per Dr Deon Pilling 2/2 adhesions - path was hegative for Crohns. EGD, Sm bowel follow through (11/03), and an eteroclysis (10/03) were unrevealing. Cuases hypomag and hypocalcemia.  . Adnexal mass 8/03   s/p lap BSO (R ovarian fibroma) & lysis of adhesions  . Allergy    Seasonal  . Anemia    Multifactorial. Baseline HgB 10-11 ish. B12 def - 150 in 3/10. Fe Def - ferritin 35 3/10. Both are being repleted.  . Breast cancer ([place]) 03/16/13   right, 5 o'clock  . CAD (coronary artery disease) 1996   1996 - PTCA and angioplasty diagonal branch. 2000 - Rotoblator & angiopllasty of diagonal. 2006 - subendocardial AMI, DES to proximal LAD.Marland Kitchen Also had 90% stenosis in distal apical LAD. EF 55 with apical hypokinesis. Indefinite ASA and Plavix.  . CHF (congestive heart failure) ([place])   . Chronic kidney disease    Chronic renal insuff baseline Cr 1.2 - 1.4 ish.  . Chronic pain    CT 10/10 = Spinal stenosis L2 - S1.  . Diabetes mellitus    Insulin dependent  . Gout   . Hx of radiation therapy 06/02/13- 07/16/13   right rbeast 4500 cGy 25 sessions, right breast boost 1600 cGy in 8 sessions  . Hyperlipidemia    Managed with both a statin and  Welchol. Welchol stopped 2014 2/2 cost and started on fenofibrate   . Hypertension    2006 B renal arteries patent. 2003 MRA - no RAS. 2003 pheo W/U Dr Hassell Done reportedly negative.  . Hypoxia 07/23/2017  . Lupus ([place])   . Lymphedema of breast   . Personal history of radiation therapy 2015  . RBBB   . Renal vein thrombosis ([place])   . SBO (small bowel obstruction) ([place]) 09/17/2017  . Secondary hyperparathyroidism ([place])   . Stroke [facility]    Incidental finding MRI 2002 L lacunar infarct  . Vitamin B12 deficiency   . Vitamin D deficiency   . Wears dentures    top    Patient Active Problem List   Diagnosis Date Noted  . Dyspnea 06/28/2020  . Chronic diarrhea - responds to budesonide 09/10/2019  . Iron deficiency anemia 08/19/2018  . Spondylosis of lumbar spine 12/24/2017  . Renal vein thrombosis ([place]) 09/17/2017  . Hoarse voice quality 09/09/2017  . Low back pain without sciatica 09/09/2017  . Labile blood glucose   . Sleep disturbance   . Leukocytosis   . Essential hypertension   . Urinary frequency   . Type 2 diabetes mellitus with peripheral neuropathy (HCC)   .  Benign essential HTN   . Stage 2 chronic kidney disease   . Dysphagia   . Debility   . Encephalopathy   . Pressure injury of skin 08/04/2017  . Pneumonia of left lung due to infectious organism   . Diffuse pulmonary alveolar hemorrhage   . SOB (shortness of breath) 10/11/2016  . Hyperparathyroidism, secondary renal ([name]) 05/02/2016  . Gout 04/11/2015  . Diabetic neuropathy associated with type 2 diabetes mellitus ([place]) 01/06/2015  . SBO (small bowel obstruction) ([place]) 09/15/2013  . Breast cancer of lower-inner quadrant of right female breast ([place]) 03/18/2013  . Chronic venous insufficiency 01/06/2013  . Health care maintenance 05/08/2011  . Diabetes mellitus type 2 in obese ([place]) 05/03/2010  . Seasonal allergies 05/03/2010  . Labile hypertension   . CAD (coronary artery disease)   . Abdominal discomfort   .  Anemia in chronic kidney disease   . History of stroke without residual deficits   . Hyperlipidemia associated with type 2 diabetes mellitus ([place])   . Chronic pain   . Chronic kidney disease, stage III (moderate) (HCC)   . Adnexal mass   . Spinal stenosis, lumbar region, with neurogenic claudication 01/03/2009    Past Surgical History:  Procedure Laterality Date  . ABDOMINAL HYSTERECTOMY    . BILATERAL SALPINGOOPHORECTOMY  8/03   Lap BSO (R ovarian fibroma) and adhesion lysis  . BOWEL RESECTION  2003   ileocolectomy with anastomosis 2/2 adhesions  . BREAST BIOPSY Right 2015  . BREAST BIOPSY Right 07/2014  . BREAST LUMPECTOMY Right 04/22/2013  . BREAST LUMPECTOMY WITH NEEDLE LOCALIZATION AND AXILLARY SENTINEL LYMPH NODE BX Right 04/22/2013   Procedure: BREAST LUMPECTOMY WITH NEEDLE LOCALIZATION AND AXILLARY SENTINEL LYMPH NODE BX;  Surgeon: Stark Klein, MD;  Location: [facility];  Service: General;  Laterality: Right;  . CARDIAC CATHETERIZATION     2 stents  . CHOLECYSTECTOMY    . COLONOSCOPY    . HEMICOLECTOMY     R sided hemicolectomy  . HERNIA REPAIR     Ventral hernia repair  . PTCA  4/06     OB History   No obstetric history on file.     Family History  Problem Relation Age of Onset  . Pancreatic cancer Brother 49  . Breast cancer Mother 38  . Lung cancer Maternal Aunt   . Breast cancer Maternal Aunt   . Prostate cancer Maternal Uncle   . Heart attack Maternal Grandmother   . Breast cancer Maternal Grandmother   . Colon cancer Maternal Grandmother   . Clotting disorder Maternal Grandmother   . Diabetes Maternal Grandmother   . Kidney failure Maternal Aunt   . Hypertension Daughter     Social History   Tobacco Use  . Smoking status: Never Smoker  . Smokeless tobacco: Never Used  Vaping Use  . Vaping Use: Never used  Substance Use Topics  . Alcohol use: No    Alcohol/week: 0.0 standard drinks  . Drug use: No    Home Medications Prior  to Admission medications   Medication Sig Start Date End Date Taking? Authorizing Provider  allopurinol (ZYLOPRIM) 100 MG tablet Take 0.5 tablets (50 mg total) by mouth every other day. 07/01/20   Mercy Riding, MD  amLODipine (NORVASC) 10 MG tablet Take 1 tablet (10 mg total) by mouth daily. 08/22/17   Angiulli, Lavon Paganini, PA-C  apixaban (ELIQUIS) 5 MG TABS tablet Take 1 tablet (5 mg total) by mouth 2 (two) times daily. Start  after you finish the starter pack 08/01/20   Mercy Riding, MD  atorvastatin (LIPITOR) 40 MG tablet Take 40 mg by mouth daily. 07/27/19   [provider]  budesonide (ENTOCORT EC) 3 MG 24 hr capsule Take 1 capsule by mouth once daily Patient taking differently: Take 3 mg by mouth daily. 04/06/20   Gatha Mayer, MD  colestipol (COLESTID) 1 g tablet Take 2 g by mouth at bedtime. 03/17/19   [provider]  Cyanocobalamin (B-12 COMPLIANCE INJECTION IJ) Inject 1 vial as directed every 30 (thirty) days.    [provider]  Epoetin Alfa-epbx (RETACRIT IJ) Inject 1 vial as directed every 30 (thirty) days.    [provider]  ferrous fumarate (HEMOCYTE - 106 MG FE) 325 (106 Fe) MG TABS tablet Take 325 mg of iron by mouth daily.    [provider]  hydrALAZINE (APRESOLINE) 100 MG tablet TAKE 1 TABLET BY MOUTH 3  TIMES DAILY Patient taking differently: Take 100 mg by mouth 3 (three) times daily. 01/20/20   Josue Hector, MD  HYDROmorphone (DILAUDID) 2 MG tablet Take 1 tablet (2 mg total) by mouth every 6 (six) hours as needed for severe pain. 07/14/20   Corena Herter, PA-C  isosorbide mononitrate (IMDUR) 30 MG 24 hr tablet TAKE 1/2 (ONE-HALF) TABLET BY MOUTH ONCE DAILY (PLEASE  SCHEDULE  AN  APPOINTMENT  FOR  FURTHER  REFILLS) Patient taking differently: Take 15 mg by mouth daily. 07/12/20   Isaiah Serge, NP  linagliptin (TRADJENTA) 5 MG TABS tablet Take 5 mg by mouth daily.    [provider]  Melatonin 5 MG TABS Take 10 mg by  mouth at bedtime.    [provider]  nebivolol (BYSTOLIC) 10 MG tablet Take 1 tablet (10 mg total) by mouth daily. 06/30/20   Mercy Riding, MD  nitroGLYCERIN (NITROSTAT) 0.4 MG SL tablet DISSOLVE 1 TABLET UNDER THE TONGUE EVERY 5 MINUTES AS  NEEDED FOR CHEST PAIN. MAX  OF 3 TABLETS IN 15 MINUTES. CALL 911 IF PAIN PERSISTS. Patient taking differently: Place 0.4 mg under the tongue every 5 (five) minutes as needed for chest pain. 10/28/19   Josue Hector, MD  pantoprazole (PROTONIX) 40 MG tablet Take 1 tablet (40 mg total) by mouth daily before breakfast. 08/27/19   Gatha Mayer, MD  pregabalin (LYRICA) 25 MG capsule Take 1 capsule (25 mg total) by mouth 2 (two) times daily. 07/19/20   Kirsteins, Luanna Salk, MD  torsemide (DEMADEX) 20 MG tablet Take 2 tablets (40 mg total) by mouth daily. 07/11/20   Josue Hector, MD  Vitamin D, Ergocalciferol, (DRISDOL) 1.25 MG (50000 UNIT) CAPS capsule Take 1 capsule (50,000 Units total) by mouth every 7 (seven) days. 08/13/19   Truitt Merle, MD    Allergies    Percocet [oxycodone-acetaminophen]; Diazepam; Haloperidol lactate; Lorazepam; Morphine sulfate; Propoxyphene hcl; Tramadol hcl; Anesthetics, halogenated; Etomidate; Fentanyl; Haloperidol; Morphine sulfate; Oxycodone-acetaminophen; Propoxyphene; Tramadol hcl; and Versed [midazolam]  Review of Systems   Review of Systems  Respiratory: Positive for shortness of breath.   Cardiovascular: Positive for leg swelling.  Gastrointestinal: Positive for diarrhea.  All other systems reviewed and are negative.   Physical Exam Updated Vital Signs BP 130/66 (BP Location: Left Arm)   Pulse 69   Temp 98 F (36.7 C) (Oral)   Resp 15   Ht 5' 4"  (1.626 m)   Wt 88.9 kg   SpO2 99%   BMI 33.64 kg/m  Physical Exam Vitals and nursing note reviewed.  Constitutional:      Appearance: Normal appearance. She is obese.  HENT:     Head: Normocephalic and atraumatic.     Right Ear: External ear normal.     Left  Ear: External ear normal.     Nose: Nose normal.     Mouth/Throat:     Mouth: Mucous membranes are moist.     Pharynx: Oropharynx is clear.  Eyes:     Extraocular Movements: Extraocular movements intact.     Conjunctiva/sclera: Conjunctivae normal.     Pupils: Pupils are equal, round, and reactive to light.  Cardiovascular:     Rate and Rhythm: Normal rate and regular rhythm.     Pulses: Normal pulses.     Heart sounds: Normal heart sounds.  Pulmonary:     Effort: Pulmonary effort is normal.     Breath sounds: Normal breath sounds.  Abdominal:     General: Abdomen is flat. Bowel sounds are normal.     Palpations: Abdomen is soft.  Musculoskeletal:     Cervical back: Normal range of motion and neck supple.     Right lower leg: Edema present.     Left lower leg: Edema present.  Skin:    General: Skin is warm.     Capillary Refill: Capillary refill takes less than 2 seconds.  Neurological:     General: No focal deficit present.     Mental Status: She is alert and oriented to person, place, and time.  Psychiatric:        Mood and Affect: Mood normal.        Behavior: Behavior normal.        Thought Content: Thought content normal.        Judgment: Judgment normal.     ED Results / Procedures / Treatments   Labs (all labs ordered are listed, but only abnormal results are displayed) Labs Reviewed  COMPREHENSIVE METABOLIC PANEL - Abnormal; Notable for the following components:      Result Value   Chloride 112 (*)    CO2 20 (*)    Glucose, Bld 136 (*)    BUN 97 (*)    Creatinine, Ser 3.00 (*)    Calcium 8.7 (*)    Total Protein 6.1 (*)    Albumin 3.0 (*)    GFR, Estimated 16 (*)    All other components within normal limits  CBC WITH DIFFERENTIAL/PLATELET - Abnormal; Notable for the following components:   WBC 10.8 (*)    RBC 2.89 (*)    Hemoglobin 8.2 (*)    HCT 27.2 (*)    Neutro Abs 8.1 (*)    Abs Immature Granulocytes 0.48 (*)    All other components within  normal limits  BRAIN NATRIURETIC PEPTIDE - Abnormal; Notable for the following components:   B Natriuretic Peptide 195.0 (*)    All other components within normal limits  MAGNESIUM - Abnormal; Notable for the following components:   Magnesium 1.3 (*)    All other components within normal limits  RESP PANEL BY RT-PCR (FLU A&B, COVID) ARPGX2  URINALYSIS, COMPLETE (UACMP) WITH MICROSCOPIC  TROPONIN I (HIGH SENSITIVITY)  TROPONIN I (HIGH SENSITIVITY)    EKG EKG Interpretation  Date/Time:  Wednesday Jul 20 2020 15:28:11 EDT Ventricular Rate:  63 PR Interval:  122 QRS Duration: 132 QT Interval:  430 QTC Calculation: 440 R Axis:   8 Text Interpretation: Normal sinus rhythm Right bundle branch block  Abnormal ECG No significant change since last tracing Confirmed by [name] [phone]) on 07/20/2020 3:52:37 PM   Radiology DG Chest 2 View  Result Date: 07/20/2020 CLINICAL DATA:  Shortness of breath EXAM: CHEST - 2 VIEW COMPARISON:  June 28, 2020 FINDINGS: Lungs are clear. Heart is mildly enlarged with pulmonary vascularity normal. No adenopathy. Stent noted in left anterior descending coronary artery. There are surgical clips in the right upper quadrant of the abdomen as well as in the right breast and right axillary regions. There is upper lumbar levoscoliosis. IMPRESSION: Heart mildly enlarged. No edema or airspace opacity. Stent in left anterior descending coronary artery. Areas of postoperative change. Electronically Signed   By: Lowella Grip III M.D.   On: 07/20/2020 16:06    Procedures Procedures   Medications Ordered in ED Medications  magnesium sulfate IVPB 2 g 50 mL (2 g Intravenous New Bag/Given 07/20/20 1733)  acetaminophen (TYLENOL) tablet 1,000 mg (has no administration in time range)  furosemide (LASIX) injection 40 mg (40 mg Intravenous Given 07/20/20 1731)    ED Course  I have reviewed the triage vital signs and the nursing notes.  Pertinent labs & imaging  results that were available during my care of the patient were reviewed by me and considered in my medical decision making (see chart for details).    MDM Rules/Calculators/A&P                         Mg is low.  This is replaced.  Anemia is chronic, but slightly worse.  Likely due to her renal function.  Kidney function is worsening.  Pt also needs diuresis.  Pt needs strict monitoring of her kidneys to do that.  Pt d/w Dr. Roosevelt Locks (triad) for admission. Final Clinical Impression(s) / ED Diagnoses Final diagnoses:  Acute on chronic congestive heart failure, unspecified heart failure type ([place])  Hypomagnesemia  AKI (acute kidney injury) ([place])  Anemia associated with chronic renal failure    Rx / DC Orders ED Discharge Orders    None       [name], MD 07/20/20 Vernelle Emerald

## 2020-07-20 NOTE — Consult Note (Signed)
Reason for Consult: Renal failure Referring Physician:  Dr. Roosevelt Locks  Chief Complaint: Shortness of breath and swelling  Assessment/Plan: 1. AKI on CKDIV - BL cr ~1.6-2 with recent hospitalization with volume overload and cr down to 1.78 at time of d/c currently on Torsemide 21m daily w/ dietary indiscretions. Acute component is likely from cardiorenal; she is no longer taking Mobic or other NSAIDs.  Renal ultrasound negative for obstruction. - Increase Lasix to 863mIV twice daily and will monitor response. - Strict I&O's and daily weights on a floor scale. - Urine lytes and urinalysis. - Avoid nephrotoxic agents and dose meds for a GFR under 20 ml/min. - No absolute indication for RRT and hopefully she will respond to aggressive diuresis if this is indeed cardiorenal.  2. Anemia - will check iron panel; transfuse as needed. 3. Left peroneal DVT on Eliquis 4. HTN - better controlled since coming to the hospital. 5. DM - on Tradjenta + ISS 6. Crohn's 7. CASHD - appears to be stable 8. HFpEF - decompensated at this time and will need aggressive diuresis as tolerated. Low Na diet as well; has been [place] w/ diet.   HPI: [name] is an [age].o. female with a h/o dCHF (EF 55-60%),  DM CASHD (remote PCI with DES LAD), breast cancer in remission, left renal vein thrombosis, DVT on Eliquis, pulmonary htn, htn, Crohn's, CKD IV w/ a baseline cr of 1.6-2 followed by Dr. UpHollie SalkCKA) presenting with worsening shortness of breath and leg swelling. She was recently hospitalized a few weeks for volume overload and found to have a left peroneal vein DVT with initiation of Eliquis. Torsemide also increased from alternating 20/40 QOD to 4074maily. Her renal function actually improved with diuresis + discontinuation of Mobic during that hospitalization with a decrease in cr from 2.65 to 1.78. Unfortunately patient's urine output decreased about 2 weeks ago with increased weights as well. She has gained ~10-15  lbs w/ reported noncompliance with her diet but has been compliant with her medications. Patient has also become more lethargic and confused per family. She denies any CP, cough, fever, chills, dysuria, hematuria, rashes, hemoptysis. In the ED she was noted to be volume overloaded with worsening renal function as well. She has had diarrhea. She denies any further NSAID use including Mobic or Alleve.    ROS Pertinent items are noted in HPI.  Chemistry and CBC: Creatinine  Date/Time Value Ref Range Status  05/23/2020 08:29 AM 1.84 (H) 0.44 - 1.00 mg/dL Final  12/21/2019 08:45 AM 2.51 (H) 0.44 - 1.00 mg/dL Final  07/21/2019 08:09 AM 1.62 (H) 0.44 - 1.00 mg/dL Final  06/29/2019 10:29 AM 2.55 (H) 0.44 - 1.00 mg/dL Final  01/12/2019 10:42 AM 1.74 (H) 0.44 - 1.00 mg/dL Final  03/14/2017 08:10 AM 1.8 (H) 0.6 - 1.1 mg/dL Final  12/19/2016 02:57 PM 2.3 (H) 0.6 - 1.1 mg/dL Final  06/21/2016 12:34 PM 1.8 (H) 0.6 - 1.1 mg/dL Final  12/12/2015 01:30 PM 2.4 (H) 0.6 - 1.1 mg/dL Final  06/02/2015 02:23 PM 1.8 (H) 0.6 - 1.1 mg/dL Final  12/06/2014 02:47 PM 1.7 (H) 0.6 - 1.1 mg/dL Final  07/22/2014 01:48 PM 2.0 (H) 0.6 - 1.1 mg/dL Final  03/03/2014 01:42 PM 1.3 (H) 0.6 - 1.1 mg/dL Final  11/04/2013 02:19 PM 1.8 (H) 0.6 - 1.1 mg/dL Final  08/06/2013 03:18 PM 1.8 (H) 0.6 - 1.1 mg/dL Final  03/25/2013 12:36 PM 1.4 (H) 0.6 - 1.1 mg/dL Final   Creat  Date/Time Value Ref Range Status  12/17/2013 11:58 AM 1.25 (H) 0.50 - 1.10 mg/dL Final  11/09/2013 04:33 PM 1.39 (H) 0.50 - 1.10 mg/dL Final  12/11/2011 11:25 AM 1.16 (H) 0.50 - 1.10 mg/dL Final  12/13/2010 04:53 PM 1.59 (H) 0.50 - 1.10 mg/dL Final  05/04/2010 02:06 PM 1.17 0.40 - 1.20 mg/dL Final   Creatinine, Ser  Date/Time Value Ref Range Status  07/20/2020 03:30 PM 3.00 (H) 0.44 - 1.00 mg/dL Final  07/19/2020 02:58 PM 2.66 (H) 0.57 - 1.00 mg/dL Final  07/14/2020 02:00 PM 2.55 (H) 0.44 - 1.00 mg/dL Final  07/01/2020 05:27 AM 1.78 (H) 0.44 - 1.00 mg/dL  Final  06/30/2020 12:40 AM 1.95 (H) 0.44 - 1.00 mg/dL Final  06/29/2020 11:13 AM 2.11 (H) 0.44 - 1.00 mg/dL Final  06/28/2020 08:56 PM 2.65 (H) 0.44 - 1.00 mg/dL Final  05/23/2019 06:16 PM 2.35 (H) 0.44 - 1.00 mg/dL Final  03/31/2019 09:55 AM 1.61 (H) 0.57 - 1.00 mg/dL Final  12/29/2018 10:51 AM 2.07 (H) 0.44 - 1.00 mg/dL Final  12/15/2018 09:21 AM 1.85 (H) 0.44 - 1.00 mg/dL Final  11/28/2018 01:30 PM 2.54 (H) 0.44 - 1.00 mg/dL Final  11/14/2018 10:35 AM 2.67 (H) 0.44 - 1.00 mg/dL Final  10/15/2018 10:23 AM 4.42 (HH) 0.44 - 1.00 mg/dL Final    Comment:    CRITICAL RESULT CALLED TO, READ BACK BY AND VERIFIED WITH: CHERYL ANDERSON,RN   09/09/2018 08:33 AM 2.45 (H) 0.44 - 1.00 mg/dL Final  08/18/2018 01:58 PM 2.83 (H) 0.44 - 1.00 mg/dL Final  02/05/2018 07:53 AM 2.12 (H) 0.44 - 1.00 mg/dL Final  10/25/2017 12:33 PM 2.20 (H) 0.40 - 1.20 mg/dL Final  10/16/2017 10:52 AM 1.95 (H) 0.44 - 1.00 mg/dL Final  09/18/2017 07:10 AM 1.12 (H) 0.44 - 1.00 mg/dL Final  09/17/2017 03:23 AM 1.26 (H) 0.44 - 1.00 mg/dL Final  09/16/2017 10:16 PM 1.48 (H) 0.44 - 1.00 mg/dL Final  08/26/2017 03:38 AM 1.20 (H) 0.44 - 1.00 mg/dL Final  08/25/2017 11:43 AM 1.26 (H) 0.44 - 1.00 mg/dL Final  08/19/2017 04:53 AM 1.08 (H) 0.44 - 1.00 mg/dL Final  08/13/2017 04:19 AM 0.99 0.44 - 1.00 mg/dL Final  08/07/2017 06:09 AM 1.14 (H) 0.44 - 1.00 mg/dL Final  08/06/2017 06:56 PM 1.07 (H) 0.44 - 1.00 mg/dL Final  08/06/2017 04:40 AM 1.03 (H) 0.44 - 1.00 mg/dL Final  08/05/2017 04:30 AM 1.14 (H) 0.44 - 1.00 mg/dL Final  08/04/2017 04:45 AM 1.21 (H) 0.44 - 1.00 mg/dL Final  08/03/2017 05:47 AM 1.20 (H) 0.44 - 1.00 mg/dL Final  08/02/2017 04:05 AM 1.38 (H) 0.44 - 1.00 mg/dL Final  08/01/2017 05:00 AM 1.67 (H) 0.44 - 1.00 mg/dL Final  07/31/2017 04:34 AM 1.91 (H) 0.44 - 1.00 mg/dL Final  07/30/2017 05:50 AM 1.94 (H) 0.44 - 1.00 mg/dL Final  07/29/2017 06:37 AM 1.97 (H) 0.44 - 1.00 mg/dL Final  07/28/2017 04:30 AM 2.37 (H) 0.44  - 1.00 mg/dL Final  07/27/2017 06:26 AM 3.03 (H) 0.44 - 1.00 mg/dL Final  07/26/2017 01:47 AM 3.04 (H) 0.44 - 1.00 mg/dL Final  07/25/2017 05:20 AM 2.63 (H) 0.44 - 1.00 mg/dL Final  07/24/2017 02:44 PM 3.08 (H) 0.44 - 1.00 mg/dL Final  07/24/2017 08:05 AM 2.77 (H) 0.44 - 1.00 mg/dL Final  07/24/2017 06:09 AM 2.43 (H) 0.44 - 1.00 mg/dL Final  07/23/2017 12:14 PM 2.31 (H) 0.44 - 1.00 mg/dL Final  06/19/2017 08:10 AM 1.77 (H) 0.60 - 1.10 mg/dL Final  01/02/2017 09:36 AM  2.09 (H) 0.57 - 1.00 mg/dL Final  10/16/2016 03:46 PM 2.02 (H) 0.57 - 1.00 mg/dL Final  10/11/2016 11:24 AM 2.05 (H) 0.44 - 1.00 mg/dL Final  07/20/2016 03:34 AM 1.40 (H) 0.44 - 1.00 mg/dL Final  07/19/2016 08:08 AM 1.78 (H) 0.44 - 1.00 mg/dL Final  05/24/2016 10:42 AM 1.79 (H) 0.57 - 1.00 mg/dL Final   Recent Labs  Lab 07/14/20 1400 07/19/20 1458 07/20/20 1530  NA 135 144 141  K 4.5 5.1 5.0  CL 105 108* 112*  CO2 20* 18* 20*  GLUCOSE 163* 135* 136*  BUN 72* 85* 97*  CREATININE 2.55* 2.66* 3.00*  CALCIUM 8.5* 8.6* 8.7*   Recent Labs  Lab 07/14/20 1400 07/20/20 1530  WBC 8.3 10.8*  NEUTROABS 6.2 8.1*  HGB 9.9* 8.2*  HCT 31.0* 27.2*  MCV 93.1 94.1  PLT 246 213   Liver Function Tests: Recent Labs  Lab 07/20/20 1530  AST 22  ALT 35  ALKPHOS 58  BILITOT 0.6  PROT 6.1*  ALBUMIN 3.0*   No results for input(s): LIPASE, AMYLASE in the last 168 hours. No results for input(s): AMMONIA in the last 168 hours. Cardiac Enzymes: No results for input(s): CKTOTAL, CKMB, CKMBINDEX, TROPONINI in the last 168 hours. Iron Studies: No results for input(s): IRON, TIBC, TRANSFERRIN, FERRITIN in the last 72 hours. PT/INR: @LABRCNTIP (inr:5)  Xrays/Other Studies: ) Results for orders placed or performed during the hospital encounter of 07/20/20 (from the past 48 hour(s))  Comprehensive metabolic panel     Status: Abnormal   Collection Time: 07/20/20  3:30 PM  Result Value Ref Range   Sodium 141 135 - 145 mmol/L    Potassium 5.0 3.5 - 5.1 mmol/L   Chloride 112 (H) 98 - 111 mmol/L   CO2 20 (L) 22 - 32 mmol/L   Glucose, Bld 136 (H) 70 - 99 mg/dL    Comment: Glucose reference range applies only to samples taken after fasting for at least 8 hours.   BUN 97 (H) 8 - 23 mg/dL   Creatinine, Ser 3.00 (H) 0.44 - 1.00 mg/dL   Calcium 8.7 (L) 8.9 - 10.3 mg/dL   Total Protein 6.1 (L) 6.5 - 8.1 g/dL   Albumin 3.0 (L) 3.5 - 5.0 g/dL   AST 22 15 - 41 U/L   ALT 35 0 - 44 U/L   Alkaline Phosphatase 58 38 - 126 U/L   Total Bilirubin 0.6 0.3 - 1.2 mg/dL   GFR, Estimated 16 (L) >60 mL/min    Comment: (NOTE) Calculated using the CKD-EPI Creatinine Equation (2021)    Anion gap 9 5 - 15    Comment: Performed at [facility], [place] [address] [place], [place] [address]  CBC with Differential     Status: Abnormal   Collection Time: 07/20/20  3:30 PM  Result Value Ref Range   WBC 10.8 (H) 4.0 - 10.5 K/uL   RBC 2.89 (L) 3.87 - 5.11 MIL/uL   Hemoglobin 8.2 (L) 12.0 - 15.0 g/dL   HCT 27.2 (L) 36.0 - 46.0 %   MCV 94.1 80.0 - 100.0 fL   MCH 28.4 26.0 - 34.0 pg   MCHC 30.1 30.0 - 36.0 g/dL   RDW 15.2 11.5 - 15.5 %   Platelets 213 150 - 400 K/uL   nRBC 0.2 0.0 - 0.2 %   Neutrophils Relative % 75 %   Neutro Abs 8.1 (H) 1.7 - 7.7 K/uL   Lymphocytes Relative 14 %   Lymphs  Abs 1.5 0.7 - 4.0 K/uL   Monocytes Relative 6 %   Monocytes Absolute 0.7 0.1 - 1.0 K/uL   Eosinophils Relative 0 %   Eosinophils Absolute 0.0 0.0 - 0.5 K/uL   Basophils Relative 0 %   Basophils Absolute 0.0 0.0 - 0.1 K/uL   Immature Granulocytes 5 %   Abs Immature Granulocytes 0.48 (H) 0.00 - 0.07 K/uL    Comment: Performed at [facility] [address], [place], [place] [address]  Troponin I (High Sensitivity)     Status: None   Collection Time: 07/20/20  3:30 PM  Result Value Ref Range   Troponin I (High Sensitivity) 11 <18 ng/L    Comment: (NOTE) Elevated high sensitivity troponin I (hsTnI) values and significant  changes across  serial measurements may suggest ACS but many other  chronic and acute conditions are known to elevate hsTnI results.  Refer to the "Links" section for chest pain algorithms and additional  guidance. Performed at [facility], [place] [address], [place], [place] [address]   Brain natriuretic peptide     Status: Abnormal   Collection Time: 07/20/20  3:30 PM  Result Value Ref Range   B Natriuretic Peptide 195.0 (H) 0.0 - 100.0 pg/mL    Comment: Performed at [facility] [address], [place], [place] [address]  Magnesium     Status: Abnormal   Collection Time: 07/20/20  4:14 PM  Result Value Ref Range   Magnesium 1.3 (L) 1.7 - 2.4 mg/dL    Comment: Performed at [facility] [address], [place],  [address]  Resp Panel by RT-PCR (Flu A&B, Covid) Nasopharyngeal Swab     Status: None   Collection Time: 07/20/20  5:16 PM   Specimen: Nasopharyngeal Swab; Nasopharyngeal(NP) swabs in vial transport medium  Result Value Ref Range   SARS Coronavirus 2 by RT PCR NEGATIVE NEGATIVE    Comment: (NOTE) SARS-CoV-2 target nucleic acids are NOT DETECTED.  The SARS-CoV-2 RNA is generally detectable in upper respiratory specimens during the acute phase of infection. The lowest concentration of SARS-CoV-2 viral copies this assay can detect is 138 copies/mL. A negative result does not preclude SARS-Cov-2 infection and should not be used as the sole basis for treatment or other patient management decisions. A negative result may occur with  improper specimen collection/handling, submission of specimen other than nasopharyngeal swab, presence of viral mutation(s) within the areas targeted by this assay, and inadequate number of viral copies(<138 copies/mL). A negative result must be combined with clinical observations, patient history, and epidemiological information. The expected result is Negative.  Fact Sheet for Patients:   EntrepreneurPulse.com.au  Fact Sheet for Healthcare Providers:  IncredibleEmployment.be  This test is no t yet approved or cleared by the [place] FDA and  has been authorized for detection and/or diagnosis of SARS-CoV-2 by FDA under an Emergency Use Authorization (EUA). This EUA will remain  in effect (meaning this test can be used) for the duration of the COVID-19 declaration under Section 564(b)(1) of the Act, 21 U.S.C.section 360bbb-3(b)(1), unless the authorization is terminated  or revoked sooner.       Influenza A by PCR NEGATIVE NEGATIVE   Influenza B by PCR NEGATIVE NEGATIVE    Comment: (NOTE) The Xpert Xpress SARS-CoV-2/FLU/RSV plus assay is intended as an aid in the diagnosis of influenza from Nasopharyngeal swab specimens and should not be used as a sole basis for treatment. Nasal washings and aspirates  are unacceptable for Xpert Xpress SARS-CoV-2/FLU/RSV testing.  Fact Sheet for Patients: EntrepreneurPulse.com.au  Fact Sheet for Healthcare Providers: IncredibleEmployment.be  This test is not yet approved or cleared by the [place] FDA and has been authorized for detection and/or diagnosis of SARS-CoV-2 by FDA under an Emergency Use Authorization (EUA). This EUA will remain in effect (meaning this test can be used) for the duration of the COVID-19 declaration under Section 564(b)(1) of the Act, 21 U.S.C. section 360bbb-3(b)(1), unless the authorization is terminated or revoked.  Performed at [facility], [place] [address], [place], [place] [address]    *Note: Due to a large number of results and/or encounters for the requested time period, some results have not been displayed. A complete set of results can be found in Results Review.   DG Chest 2 View  Result Date: 07/20/2020 CLINICAL DATA:  Shortness of breath EXAM: CHEST - 2 VIEW COMPARISON:  June 28, 2020 FINDINGS: Lungs  are clear. Heart is mildly enlarged with pulmonary vascularity normal. No adenopathy. Stent noted in left anterior descending coronary artery. There are surgical clips in the right upper quadrant of the abdomen as well as in the right breast and right axillary regions. There is upper lumbar levoscoliosis. IMPRESSION: Heart mildly enlarged. No edema or airspace opacity. Stent in left anterior descending coronary artery. Areas of postoperative change. Electronically Signed   By: Lowella Grip III M.D.   On: 07/20/2020 16:06   US RENAL  Result Date: 07/20/2020 CLINICAL DATA:  75 year old female with history of acute kidney injury. EXAM: RENAL / URINARY TRACT ULTRASOUND COMPLETE COMPARISON:  No priors. FINDINGS: Right Kidney: Renal measurements: 9.8 x 5.2 x 4.9 cm = volume: 129 mL. Echogenicity within normal limits. Multiple anechoic lesions with increased through transmission, largest of which is in the upper pole there is a well-defined 2.1 x 1.8 x 1.8 cm lesion, compatible with a simple cyst. No hydronephrosis visualized. Left Kidney: Renal measurements: 10.1 x 5.2 x 4.2 cm = volume: 116 mL. Echogenicity within normal limits. In the lower pole there is a well-defined 2.1 x 1.6 x 1.7 cm anechoic lesion with increased through transmission, compatible with a simple cyst. No hydronephrosis visualized. Bladder: Appears normal for degree of bladder distention. Other: None. IMPRESSION: 1. No acute findings to account for the patient's presentation. Specifically, no evidence of hydronephrosis. 2. Small simple cysts in the kidneys bilaterally, as above. Electronically Signed   By: Vinnie Langton M.D.   On: 07/20/2020 19:52    PMH:   Past Medical History:  Diagnosis Date  . Abdominal discomfort    Chronic N/V/D. Presumptive dx Crohn's dx per elevated p ANCA. Failed Entocort and Pentasa. Sep 2003 - ileocolectomy c anastomosis per Dr Deon Pilling 2/2 adhesions - path was hegative for Crohns. EGD, Sm bowel follow  through (11/03), and an eteroclysis (10/03) were unrevealing. Cuases hypomag and hypocalcemia.  . Adnexal mass 8/03   s/p lap BSO (R ovarian fibroma) & lysis of adhesions  . Allergy    Seasonal  . Anemia    Multifactorial. Baseline HgB 10-11 ish. B12 def - 150 in 3/10. Fe Def - ferritin 35 3/10. Both are being repleted.  . Breast cancer ([place]) 03/16/13   right, 5 o'clock  . CAD (coronary artery disease) 1996   1996 - PTCA and angioplasty diagonal branch. 2000 - Rotoblator & angiopllasty of diagonal. 2006 - subendocardial AMI, DES to proximal LAD.Marland Kitchen Also had 90% stenosis in distal apical LAD. EF 55 with apical  hypokinesis. Indefinite ASA and Plavix.  . CHF (congestive heart failure) ([place])   . Chronic kidney disease    Chronic renal insuff baseline Cr 1.2 - 1.4 ish.  . Chronic pain    CT 10/10 = Spinal stenosis L2 - S1.  . Diabetes mellitus    Insulin dependent  . Gout   . Hx of radiation therapy 06/02/13- 07/16/13   right rbeast 4500 cGy 25 sessions, right breast boost 1600 cGy in 8 sessions  . Hyperlipidemia    Managed with both a statin and Welchol. Welchol stopped 2014 2/2 cost and started on fenofibrate   . Hypertension    2006 B renal arteries patent. 2003 MRA - no RAS. 2003 pheo W/U Dr Hassell Done reportedly negative.  . Hypoxia 07/23/2017  . Lupus ([place])   . Lymphedema of breast   . Personal history of radiation therapy 2015  . RBBB   . Renal vein thrombosis ([place])   . SBO (small bowel obstruction) ([place]) 09/17/2017  . Secondary hyperparathyroidism ([place])   . Stroke [facility])    Incidental finding MRI 2002 L lacunar infarct  . Vitamin B12 deficiency   . Vitamin D deficiency   . Wears dentures    top    PSH:   Past Surgical History:  Procedure Laterality Date  . ABDOMINAL HYSTERECTOMY    . BILATERAL SALPINGOOPHORECTOMY  8/03   Lap BSO (R ovarian fibroma) and adhesion lysis  . BOWEL RESECTION  2003   ileocolectomy with anastomosis 2/2 adhesions  . BREAST BIOPSY Right 2015  . BREAST  BIOPSY Right 07/2014  . BREAST LUMPECTOMY Right 04/22/2013  . BREAST LUMPECTOMY WITH NEEDLE LOCALIZATION AND AXILLARY SENTINEL LYMPH NODE BX Right 04/22/2013   Procedure: BREAST LUMPECTOMY WITH NEEDLE LOCALIZATION AND AXILLARY SENTINEL LYMPH NODE BX;  Surgeon: Stark Klein, MD;  Location: [facility];  Service: General;  Laterality: Right;  . CARDIAC CATHETERIZATION     2 stents  . CHOLECYSTECTOMY    . COLONOSCOPY    . HEMICOLECTOMY     R sided hemicolectomy  . HERNIA REPAIR     Ventral hernia repair  . PTCA  4/06    Allergies:  Allergies  Allergen Reactions  . Percocet [Oxycodone-Acetaminophen] Other (See Comments)    headaches  . Diazepam Other (See Comments)    REACTION: Agitation Other reaction(s): agitation  . Haloperidol Lactate Nausea And Vomiting  . Lorazepam Nausea Only    Other reaction(s): nausea  . Morphine Sulfate Other (See Comments)    REACTION: Agitation  . Propoxyphene Hcl Nausea And Vomiting  . Tramadol Hcl Nausea And Vomiting  . Anesthetics, Halogenated     Per patient's daughter, she can not tolerate anesthetics.   . Etomidate     Heart stopped during anesthesia  . Fentanyl     Heart stopped during anesthesia   . Haloperidol     Other reaction(s): nausea & vomitin  . Morphine Sulfate     Other reaction(s): nausea  . Oxycodone-Acetaminophen     Other reaction(s): headaches  . Propoxyphene     Other reaction(s): nausea & vomiting  . Tramadol Hcl     Other reaction(s): nausea & vomitin Other reaction(s): nausea & vomitin  . Versed [Midazolam]     Heart stopped during anesthesia    Medications:   Prior to Admission medications   Medication Sig Start Date End Date Taking? Authorizing Provider  allopurinol (ZYLOPRIM) 100 MG tablet Take 0.5 tablets (50 mg total) by mouth every other day.  07/01/20  Yes Mercy Riding, MD  amLODipine (NORVASC) 10 MG tablet Take 1 tablet (10 mg total) by mouth daily. 08/22/17  Yes Angiulli, Lavon Paganini, PA-C   apixaban (ELIQUIS) 5 MG TABS tablet Take 1 tablet (5 mg total) by mouth 2 (two) times daily. Start after you finish the starter pack 08/01/20  Yes Gonfa, Taye T, MD  atorvastatin (LIPITOR) 40 MG tablet Take 40 mg by mouth daily. 07/27/19  Yes [provider]  colestipol (COLESTID) 1 g tablet Take 2 g by mouth at bedtime. 03/17/19  Yes [provider]  Cyanocobalamin (B-12 COMPLIANCE INJECTION IJ) Inject 1 vial as directed every 30 (thirty) days.   Yes [provider]  Epoetin Alfa-epbx (RETACRIT IJ) Inject 1 vial as directed every 30 (thirty) days.   Yes [provider]  ferrous fumarate (HEMOCYTE - 106 MG FE) 325 (106 Fe) MG TABS tablet Take 325 mg of iron by mouth daily.   Yes [provider]  hydrALAZINE (APRESOLINE) 100 MG tablet TAKE 1 TABLET BY MOUTH 3  TIMES DAILY Patient taking differently: Take 100 mg by mouth 3 (three) times daily. 01/20/20  Yes Josue Hector, MD  HYDROmorphone (DILAUDID) 2 MG tablet Take 1 tablet (2 mg total) by mouth every 6 (six) hours as needed for severe pain. 07/14/20  Yes Corena Herter, PA-C  isosorbide mononitrate (IMDUR) 30 MG 24 hr tablet TAKE 1/2 (ONE-HALF) TABLET BY MOUTH ONCE DAILY (PLEASE  SCHEDULE  AN  APPOINTMENT  FOR  FURTHER  REFILLS) Patient taking differently: Take 15 mg by mouth daily. 07/12/20  Yes Isaiah Serge, NP  linagliptin (TRADJENTA) 5 MG TABS tablet Take 5 mg by mouth daily.   Yes [provider]  Melatonin 5 MG TABS Take 10 mg by mouth at bedtime.   Yes [provider]  nebivolol (BYSTOLIC) 10 MG tablet Take 1 tablet (10 mg total) by mouth daily. 06/30/20  Yes Mercy Riding, MD  nitroGLYCERIN (NITROSTAT) 0.4 MG SL tablet DISSOLVE 1 TABLET UNDER THE TONGUE EVERY 5 MINUTES AS  NEEDED FOR CHEST PAIN. MAX  OF 3 TABLETS IN 15 MINUTES. CALL 911 IF PAIN PERSISTS. Patient taking differently: Place 0.4 mg under the tongue every 5 (five) minutes as needed for chest pain. 10/28/19  Yes  Josue Hector, MD  pantoprazole (PROTONIX) 40 MG tablet Take 1 tablet (40 mg total) by mouth daily before breakfast. 08/27/19  Yes Gatha Mayer, MD  pregabalin (LYRICA) 25 MG capsule Take 1 capsule (25 mg total) by mouth 2 (two) times daily. 07/19/20  Yes Kirsteins, Luanna Salk, MD  torsemide (DEMADEX) 20 MG tablet Take 2 tablets (40 mg total) by mouth daily. 07/11/20  Yes Josue Hector, MD  budesonide (ENTOCORT EC) 3 MG 24 hr capsule Take 1 capsule by mouth once daily Patient taking differently: Take 3 mg by mouth daily. 04/06/20   Gatha Mayer, MD    Discontinued Meds:   Medications Discontinued During This Encounter  Medication Reason  . Vitamin D, Ergocalciferol, (DRISDOL) 1.25 MG (50000 UNIT) CAPS capsule Patient Preference    Social History:  reports that she has never smoked. She has never used smokeless tobacco. She reports that she does not drink alcohol and does not use drugs.  Family History:   Family History  Problem Relation Age of Onset  . Pancreatic cancer Brother 55  . Breast cancer Mother 48  . Lung cancer Maternal Aunt   . Breast cancer Maternal Aunt   .  Prostate cancer Maternal Uncle   . Heart attack Maternal Grandmother   . Breast cancer Maternal Grandmother   . Colon cancer Maternal Grandmother   . Clotting disorder Maternal Grandmother   . Diabetes Maternal Grandmother   . Kidney failure Maternal Aunt   . Hypertension Daughter     Blood pressure 137/66, pulse 64, temperature 98 F (36.7 C), temperature source Oral, resp. rate 12, height 5' 4"  (1.626 m), weight 88.9 kg, SpO2 99 %. General appearance: alert, cooperative and appears stated age Head: Normocephalic, without obvious abnormality, atraumatic Eyes: conj pallor Neck: no adenopathy, no carotid bruit, supple, symmetrical, trachea midline, thyroid not enlarged, symmetric, no tenderness/mass/nodules and JVD to 10cm Back: negative Resp: rales bibasilar Cardio: regular rate and rhythm GI:  Obese Extremities: edema 2+ b/l Pulses: 2+ and symmetric Skin: Skin color, texture, turgor normal. No rashes or lesions       Richele Strand, Hunt Oris, MD 07/20/2020, 8:06 PM

## 2020-07-20 NOTE — Telephone Encounter (Signed)
Patient reporting that she has not been feeling well and that she is having trouble with kidney function. Has plans to contact the MD but has not had time. Advised patient to call the clinic if she needs to cancel and encouraged patient to contact MD. Patient verbalizing understanding.

## 2020-07-21 ENCOUNTER — Encounter (HOSPITAL_COMMUNITY): Payer: Self-pay | Admitting: Internal Medicine

## 2020-07-21 ENCOUNTER — Other Ambulatory Visit: Payer: Medicare Other

## 2020-07-21 DIAGNOSIS — D631 Anemia in chronic kidney disease: Secondary | ICD-10-CM

## 2020-07-21 DIAGNOSIS — N189 Chronic kidney disease, unspecified: Secondary | ICD-10-CM

## 2020-07-21 DIAGNOSIS — I509 Heart failure, unspecified: Secondary | ICD-10-CM

## 2020-07-21 LAB — IRON AND TIBC
Iron: 60 ug/dL (ref 28–170)
Saturation Ratios: 19 % (ref 10.4–31.8)
TIBC: 322 ug/dL (ref 250–450)
UIBC: 262 ug/dL

## 2020-07-21 LAB — BASIC METABOLIC PANEL
Anion gap: 9 (ref 5–15)
BUN: 96 mg/dL — ABNORMAL HIGH (ref 8–23)
CO2: 24 mmol/L (ref 22–32)
Calcium: 9.1 mg/dL (ref 8.9–10.3)
Chloride: 108 mmol/L (ref 98–111)
Creatinine, Ser: 2.8 mg/dL — ABNORMAL HIGH (ref 0.44–1.00)
GFR, Estimated: 17 mL/min — ABNORMAL LOW (ref >60)
Glucose, Bld: 94 mg/dL (ref 70–99)
Potassium: 4.8 mmol/L (ref 3.5–5.1)
Sodium: 141 mmol/L (ref 135–145)

## 2020-07-21 LAB — RETICULOCYTES
Immature Retic Fract: 23 % — ABNORMAL HIGH (ref 2.3–15.9)
RBC.: 2.83 MIL/uL — ABNORMAL LOW (ref 3.87–5.11)
Retic Count, Absolute: 95.7 10*3/uL (ref 19.0–186.0)
Retic Ct Pct: 3.4 % — ABNORMAL HIGH (ref 0.4–3.1)

## 2020-07-21 LAB — FERRITIN: Ferritin: 132 ng/mL (ref 11–307)

## 2020-07-21 LAB — MRSA PCR SCREENING: MRSA by PCR: NEGATIVE

## 2020-07-21 NOTE — Progress Notes (Signed)
Progress Note    [name]  FUX:[id number] DOB: [birth year]  DOA: 07/20/2020 PCP: Seward Carol, MD    Brief Narrative:     Medical records reviewed and are as summarized below:  [name] is an 75 y.o. female  with medical history significant of CKD stage IV, chronic diastolic CHF, IIDM, CAD, LLE DVT on Eliquis, pulmonary hypertension, HTN, Gour, Crohn's disease, chronic iron deficiency anemia and chronic anemia secondary to CKD, presented with increasing shortness of breath and leg swelling. Recent hospitalization at [facility] < 1 month ago.    Assessment/Plan:   Active Problems:   AKI (acute kidney injury) ([place])   CHF (congestive heart failure) (HCC)   Acute on chronic diastolic CHF decompensation -echo: Left ventricular ejection fraction, by estimation, is 55 to 60%. The  left ventricle has normal function. The left ventricle has no regional  wall motion abnormalities. There is mild left ventricular hypertrophy.  Left ventricular diastolic parameters  are consistent with Grade I diastolic dysfunction (impaired relaxation).  -Significant fluid overload.  Probably secondary from worsening of CKD as well as noncompliant with salt and fluid intake- per Dr. Roosevelt Locks -IV lasix  AKI ON CKD stage IV, with worsening of uremia and azotemia -nephrologist. -Renal ultrasound unremarkable  chronic anemia -Denied any obvious bleeding. -Iron level and reticulocyte count ok -recheck in AM after diuresis  DVT -Continue Eliquis- plan is for 3 months starting 06/2020   IDDM -Continue Tradjenta, add sliding scale  Crohn's disease -Continue Entocort.  obesity Body mass index is 32.88 kg/m.   OOB, PT eval (fell 2 weeks ago)  Family Communication/Anticipated D/C date and plan/Code Status   DVT prophylaxis: Lovenox ordered. Code Status: Full Code.  Disposition Plan: Status is: Inpatient  Remains inpatient appropriate because:Inpatient level of care appropriate  due to severity of illness   Dispo: The patient is from: Home              Anticipated d/c is to: Home              Patient currently is not medically stable to d/c.   Difficult to place patient No         Medical Consultants:    renal  Subjective:   Would like to get out of bed and use the toilet  Objective:    Vitals:   07/21/20 0721 07/21/20 1048 07/21/20 1156 07/21/20 1213  BP: (!) 146/66 139/77    Pulse: 79 79 78 84  Resp: 16 (!) 21    Temp: 97.7 F (36.5 C) 97.6 F (36.4 C)    TempSrc: Axillary Oral    SpO2: 98% 98% 98% 95%  Weight:      Height:        Intake/Output Summary (Last 24 hours) at 07/21/2020 1222 Last data filed at 07/21/2020 1038 Gross per 24 hour  Intake 43.65 ml  Output 3900 ml  Net -3856.35 ml   Filed Weights   07/20/20 1524 07/21/20 0145  Weight: 88.9 kg 86.9 kg    Exam:  General: Appearance:    Obese female in no acute distress     Lungs:      respirations unlabored  Heart:    Normal heart rate. Normal rhythm. No murmurs, rubs, or gallops.   MS:   All extremities are intact. + edema  Neurologic:   Awake, alert, oriented x 3. No apparent focal neurological           defect.  Data Reviewed:   I have personally reviewed following labs and imaging studies:  Labs: Labs show the following:   Basic Metabolic Panel: Recent Labs  Lab 07/14/20 1400 07/19/20 1458 07/20/20 1530 07/20/20 1614 07/21/20 0418  NA 135 144 141  --  141  K 4.5 5.1 5.0  --  4.8  CL 105 108* 112*  --  108  CO2 20* 18* 20*  --  24  GLUCOSE 163* 135* 136*  --  94  BUN 72* 85* 97*  --  96*  CREATININE 2.55* 2.66* 3.00*  --  2.80*  CALCIUM 8.5* 8.6* 8.7*  --  9.1  MG  --   --   --  1.3*  --    GFR Estimated Creatinine Clearance: 19.1 mL/min (A) (by C-G formula based on SCr of 2.8 mg/dL (H)). Liver Function Tests: Recent Labs  Lab 07/20/20 1530  AST 22  ALT 35  ALKPHOS 58  BILITOT 0.6  PROT 6.1*  ALBUMIN 3.0*   No results for input(s):  LIPASE, AMYLASE in the last 168 hours. No results for input(s): AMMONIA in the last 168 hours. Coagulation profile No results for input(s): INR, PROTIME in the last 168 hours.  CBC: Recent Labs  Lab 07/14/20 1400 07/20/20 1530  WBC 8.3 10.8*  NEUTROABS 6.2 8.1*  HGB 9.9* 8.2*  HCT 31.0* 27.2*  MCV 93.1 94.1  PLT 246 213   Cardiac Enzymes: No results for input(s): CKTOTAL, CKMB, CKMBINDEX, TROPONINI in the last 168 hours. BNP (last 3 results) No results for input(s): PROBNP in the last 8760 hours. CBG: No results for input(s): GLUCAP in the last 168 hours. D-Dimer: No results for input(s): DDIMER in the last 72 hours. Hgb A1c: No results for input(s): HGBA1C in the last 72 hours. Lipid Profile: No results for input(s): CHOL, HDL, LDLCALC, TRIG, CHOLHDL, LDLDIRECT in the last 72 hours. Thyroid function studies: No results for input(s): TSH, T4TOTAL, T3FREE, THYROIDAB in the last 72 hours.  Invalid input(s): FREET3 Anemia work up: Recent Labs    07/21/20 0418  FERRITIN 132  TIBC 322  IRON 60  RETICCTPCT 3.4*   Sepsis Labs: Recent Labs  Lab 07/14/20 1400 07/20/20 1530  WBC 8.3 10.8*    Microbiology Recent Results (from the past 240 hour(s))  Resp Panel by RT-PCR (Flu A&B, Covid) Nasopharyngeal Swab     Status: None   Collection Time: 07/20/20  5:16 PM   Specimen: Nasopharyngeal Swab; Nasopharyngeal(NP) swabs in vial transport medium  Result Value Ref Range Status   SARS Coronavirus 2 by RT PCR NEGATIVE NEGATIVE Final    Comment: (NOTE) SARS-CoV-2 target nucleic acids are NOT DETECTED.  The SARS-CoV-2 RNA is generally detectable in upper respiratory specimens during the acute phase of infection. The lowest concentration of SARS-CoV-2 viral copies this assay can detect is 138 copies/mL. A negative result does not preclude SARS-Cov-2 infection and should not be used as the sole basis for treatment or other patient management decisions. A negative result may  occur with  improper specimen collection/handling, submission of specimen other than nasopharyngeal swab, presence of viral mutation(s) within the areas targeted by this assay, and inadequate number of viral copies(<138 copies/mL). A negative result must be combined with clinical observations, patient history, and epidemiological information. The expected result is Negative.  Fact Sheet for Patients:  EntrepreneurPulse.com.au  Fact Sheet for Healthcare Providers:  IncredibleEmployment.be  This test is no t yet approved or cleared by the [place] FDA and  has  been authorized for detection and/or diagnosis of SARS-CoV-2 by FDA under an Emergency Use Authorization (EUA). This EUA will remain  in effect (meaning this test can be used) for the duration of the COVID-19 declaration under Section 564(b)(1) of the Act, 21 U.S.C.section 360bbb-3(b)(1), unless the authorization is terminated  or revoked sooner.       Influenza A by PCR NEGATIVE NEGATIVE Final   Influenza B by PCR NEGATIVE NEGATIVE Final    Comment: (NOTE) The Xpert Xpress SARS-CoV-2/FLU/RSV plus assay is intended as an aid in the diagnosis of influenza from Nasopharyngeal swab specimens and should not be used as a sole basis for treatment. Nasal washings and aspirates are unacceptable for Xpert Xpress SARS-CoV-2/FLU/RSV testing.  Fact Sheet for Patients: EntrepreneurPulse.com.au  Fact Sheet for Healthcare Providers: IncredibleEmployment.be  This test is not yet approved or cleared by the [place] FDA and has been authorized for detection and/or diagnosis of SARS-CoV-2 by FDA under an Emergency Use Authorization (EUA). This EUA will remain in effect (meaning this test can be used) for the duration of the COVID-19 declaration under Section 564(b)(1) of the Act, 21 U.S.C. section 360bbb-3(b)(1), unless the authorization is terminated  or revoked.  Performed at [facility], [place] [address] [place], [place] [address]   MRSA PCR Screening     Status: None   Collection Time: 07/21/20  1:41 AM   Specimen: Nasal Mucosa; Nasopharyngeal  Result Value Ref Range Status   MRSA by PCR NEGATIVE NEGATIVE Final    Comment:        The GeneXpert MRSA Assay (FDA approved for NASAL specimens only), is one component of a comprehensive MRSA colonization surveillance program. It is not intended to diagnose MRSA infection nor to guide or monitor treatment for MRSA infections. Performed at [facility], [place] [address] [place], [place] [address]     Procedures and diagnostic studies:  DG Chest 2 View  Result Date: 07/20/2020 CLINICAL DATA:  Shortness of breath EXAM: CHEST - 2 VIEW COMPARISON:  June 28, 2020 FINDINGS: Lungs are clear. Heart is mildly enlarged with pulmonary vascularity normal. No adenopathy. Stent noted in left anterior descending coronary artery. There are surgical clips in the right upper quadrant of the abdomen as well as in the right breast and right axillary regions. There is upper lumbar levoscoliosis. IMPRESSION: Heart mildly enlarged. No edema or airspace opacity. Stent in left anterior descending coronary artery. Areas of postoperative change. Electronically Signed   By: Lowella Grip III M.D.   On: 07/20/2020 16:06   US RENAL  Result Date: 07/20/2020 CLINICAL DATA:  75 year old female with history of acute kidney injury. EXAM: RENAL / URINARY TRACT ULTRASOUND COMPLETE COMPARISON:  No priors. FINDINGS: Right Kidney: Renal measurements: 9.8 x 5.2 x 4.9 cm = volume: 129 mL. Echogenicity within normal limits. Multiple anechoic lesions with increased through transmission, largest of which is in the upper pole there is a well-defined 2.1 x 1.8 x 1.8 cm lesion, compatible with a simple cyst. No hydronephrosis visualized. Left Kidney: Renal measurements: 10.1 x 5.2 x 4.2 cm = volume: 116 mL. Echogenicity  within normal limits. In the lower pole there is a well-defined 2.1 x 1.6 x 1.7 cm anechoic lesion with increased through transmission, compatible with a simple cyst. No hydronephrosis visualized. Bladder: Appears normal for degree of bladder distention. Other: None. IMPRESSION: 1. No acute findings to account for the patient's presentation. Specifically, no evidence of hydronephrosis. 2. Small simple cysts in the kidneys  bilaterally, as above. Electronically Signed   By: Vinnie Langton M.D.   On: 07/20/2020 19:52    Medications:   . allopurinol  50 mg Oral QODAY  . amLODipine  10 mg Oral Daily  . apixaban  5 mg Oral BID  . atorvastatin  40 mg Oral Daily  . budesonide  3 mg Oral Daily  . colestipol  2 g Oral QHS  . Ferrous Fumarate  1 tablet Oral Daily  . furosemide  80 mg Intravenous Q12H  . hydrALAZINE  100 mg Oral TID  . isosorbide mononitrate  15 mg Oral Daily  . linagliptin  5 mg Oral Daily  . melatonin  10 mg Oral QHS  . nebivolol  10 mg Oral Daily  . pantoprazole  40 mg Oral QAC breakfast  . pregabalin  25 mg Oral BID  . sodium chloride flush  3 mL Intravenous Q12H   Continuous Infusions: . sodium chloride       LOS: 1 day   Geradine Girt  [facility]   How to contact the [facility] Attending or Consulting provider [place] or covering provider during after hours [place], for this patient?  1. Check the care team in [facility] and look for a) attending/consulting [facility] provider listed and b) the [facility] team listed 2. Log into [email or web address] and use [place]'s universal password to access. If you do not have the password, please contact the hospital operator. 3. Locate the [facility] provider you are looking for under [facility] and page to a number that you can be directly reached. 4. If you still have difficulty reaching the provider, please page the [facility] (Director on Call) for the Hospitalists listed on amion for assistance.  07/21/2020, 12:22 PM

## 2020-07-21 NOTE — Evaluation (Addendum)
Physical Therapy Evaluation Patient Details Name: [name] MRN: [id number] DOB: 05-18-1945 Today's Date: 07/21/2020   History of Present Illness  75 year old female admitted 5/4 with shortness of breath and edema with AKI on CKD. PMHx:, DM-2, CAD, Breast cancer in remission, anemia of renal disease, gout, DVT, Crohn's disease, pulm HTN, HTN, spinal stenosis, chronic pain  Clinical Impression  Pt very pleasant and reports 3 falls in last 6 months. 2 of these falls were carrying bags up a step without RW with fall onto right hip and left shoulder. Pt with painful and weak right hip with trendelenburg gait as well as very limited ROM of left shoulder. Pt needs to use walker at all times with gait and educated for need for assist at home with tasks that involve carrying items particularly on stairs. Pt reports family can assist with shopping and household chores to prevent falls. Pt with decreased strength, ROM, transfers, gait and balance who will benefit from acute therapy to maximize mobility, safety and function. Pt works at a daycare and also educated that she could not currently return to work caring for small children.Encouraged OOB and walking with staff supervision.   SpO2 >95% on RA throughout    Follow Up Recommendations Home health PT;Supervision - Intermittent    Equipment Recommendations  None recommended by PT    Recommendations for Other Services       Precautions / Restrictions Precautions Precautions: Fall      Mobility  Bed Mobility Overal bed mobility: Modified Independent                  Transfers Overall transfer level: Modified independent               General transfer comment: pt able to stand from bed and toilet without physical assist  Ambulation/Gait Ambulation/Gait assistance: Supervision Gait Distance (Feet): 350 Feet Assistive device: Rolling walker (2 wheeled) Gait Pattern/deviations: Step-through pattern;Decreased stride  length;Trendelenburg   Gait velocity interpretation: 1.31 - 2.62 ft/sec, indicative of limited community ambulator General Gait Details: HR 82, SpO2 95% on RA. Pt attempted gait without RW with significant trendelenburg gait and returned to RW after only 5'. significantly improved stability and pelvic stability with use of RW. Cues for direction  Stairs            Wheelchair Mobility    Modified Rankin (Stroke Patients Only)       Balance Overall balance assessment: Needs assistance;History of Falls   Sitting balance-Leahy Scale: Good     Standing balance support: Bilateral upper extremity supported Standing balance-Leahy Scale: Poor Standing balance comment: reliant on single or bil UE support in standing and rW for gait                             Pertinent Vitals/Pain Faces Pain Scale: Hurts little more Pain Location: right hip with gait when unsupported, left shoulder with flex and ABD Pain Intervention(s): Limited activity within patient's tolerance;Monitored during session;Repositioned    Home Living Family/patient expects to be discharged to:: Private residence Living Arrangements: Alone Available Help at Discharge: Available 24 hours/day Type of Home: Apartment Home Access: Stairs to enter   [employer] of Steps: 1 Home Layout: One level Home Equipment: Walker - 4 wheels      Prior Function Level of Independence: Independent with assistive device(s)   Gait / Transfers Assistance Needed: uses rollator     Comments: drives, grocery shopping,  several falls in the last 6 month     Hand Dominance        Extremity/Trunk Assessment   Upper Extremity Assessment Upper Extremity Assessment: LUE deficits/detail LUE Deficits / Details: very limited shoulder ROM flexion grossly 70 degrees and abduction 20 degrees limited by pain with PROM    Lower Extremity Assessment Lower Extremity Assessment: RLE deficits/detail RLE Deficits /  Details: right hip weakness with trendelenburg gait but able to complete standing hip abduction bil without reproduction    Cervical / Trunk Assessment Cervical / Trunk Assessment: Normal  Communication   Communication: No difficulties  Cognition Arousal/Alertness: Awake/alert Behavior During Therapy: WFL for tasks assessed/performed Overall Cognitive Status: Within Functional Limits for tasks assessed                                        General Comments      Exercises General Exercises - Lower Extremity Hip ABduction/ADduction: AROM;Standing;Both;5 reps   Assessment/Plan    PT Assessment Patient needs continued PT services  PT Problem List Decreased strength;Decreased mobility;Decreased range of motion;Decreased activity tolerance;Decreased balance;Decreased knowledge of use of DME;Pain       PT Treatment Interventions Gait training;Balance training;Functional mobility training;Therapeutic activities;Stair training;Patient/family education;DME instruction;Therapeutic exercise    PT Goals (Current goals can be found in the Care Plan section)  Acute Rehab PT Goals Patient Stated Goal: go home and cook PT Goal Formulation: With patient Time For Goal Achievement: 08/04/20 Potential to Achieve Goals: Good    Frequency Min 3X/week   Barriers to discharge Decreased caregiver support      Co-evaluation               AM-PAC PT "6 Clicks" Mobility  Outcome Measure Help needed turning from your back to your side while in a flat bed without using bedrails?: None Help needed moving from lying on your back to sitting on the side of a flat bed without using bedrails?: None Help needed moving to and from a bed to a chair (including a wheelchair)?: None Help needed standing up from a chair using your arms (e.g., wheelchair or bedside chair)?: A Little Help needed to walk in hospital room?: A Little Help needed climbing 3-5 steps with a railing? : A  Little 6 Click Score: 21    End of Session   Activity Tolerance: Patient tolerated treatment well Patient left: in chair;with call bell/phone within reach Nurse Communication: Mobility status PT Visit Diagnosis: Other abnormalities of gait and mobility (R26.89);Muscle weakness (generalized) (M62.81);History of falling (Z91.81)    Time: 7672-0947 PT Time Calculation (min) (ACUTE ONLY): 32 min   Charges:   PT Evaluation $PT Eval Moderate Complexity: 1 Mod PT Treatments $Gait Training: 8-22 mins        Sierrah Luevano P, PT Acute Rehabilitation Services Pager: [phone] Office: [place] 07/21/2020, 1:41 PM

## 2020-07-21 NOTE — Progress Notes (Signed)
Admit: 07/20/2020 LOS: 1  60F CKD4 with hx/ HFpEF admit with weight gaini, [name], SOB  Subjective:  . 3.9L UOP on IV lasix . SOB improved this AM . Stable elevated SCr, K 4.8 . Remains edmatous  05/04 0701 - 05/05 0700 In: 43.7 [IV Piggyback:43.7] Out: 2300 [Urine:2300]  Filed Weights   07/20/20 1524 07/21/20 0145  Weight: 88.9 kg 86.9 kg    Scheduled Meds: . allopurinol  50 mg Oral QODAY  . amLODipine  10 mg Oral Daily  . apixaban  5 mg Oral BID  . atorvastatin  40 mg Oral Daily  . budesonide  3 mg Oral Daily  . colestipol  2 g Oral QHS  . Ferrous Fumarate  1 tablet Oral Daily  . furosemide  80 mg Intravenous Q12H  . hydrALAZINE  100 mg Oral TID  . isosorbide mononitrate  15 mg Oral Daily  . linagliptin  5 mg Oral Daily  . melatonin  10 mg Oral QHS  . nebivolol  10 mg Oral Daily  . pantoprazole  40 mg Oral QAC breakfast  . pregabalin  25 mg Oral BID  . sodium chloride flush  3 mL Intravenous Q12H   Continuous Infusions: . sodium chloride     PRN Meds:.sodium chloride, acetaminophen, HYDROmorphone, ondansetron (ZOFRAN) IV, sodium chloride flush  Current Labs: reviewed  Results for [name], [name] (MRN [id number]) as of 07/21/2020 11:55  Ref. Range 07/21/2020 04:18  Saturation Ratios Latest Ref Range: 10.4 - 31.8 % 19  Ferritin Latest Ref Range: 11 - 307 ng/mL 132    Physical Exam:  Blood pressure 139/77, pulse 79, temperature 97.6 F (36.4 C), temperature source Oral, resp. rate (!) 21, height 5' 4"  (1.626 m), weight 86.9 kg, SpO2 98 %. NAD lying flat RRR Bibasilar crackles, nl wob 1-2+ pitting [name] b/l No rshes/lesions nonfocal AAO x3  A 1. AoCKD4; BL SCr variable ? 1.8-2.6; worsened at admission; UA bland. Renal US w/o acute issues 2. Hypervolemia / [name] / SOB; AoC HFpEF 3. Anemia, TSAT 19% and Ferritin 132 4. Hx/o L peroneal DVT on DOAC 5. HTN, stable 6. Hx/o IBD 7. DM2 8. CAD  P . Cont current diuretic regimen, effective and pt improving  . Cont PO  Fe, might need ESA, trend Hb  . Medication Issues; o Preferred narcotic agents for pain control are hydromorphone, fentanyl, and methadone. Morphine should not be used.  o Baclofen should be avoided o Avoid oral sodium phosphate and magnesium citrate based laxatives / bowel preps    Pearson [name] MD 07/21/2020, 11:55 AM  Recent Labs  Lab 07/19/20 1458 07/20/20 1530 07/21/20 0418  NA 144 141 141  K 5.1 5.0 4.8  CL 108* 112* 108  CO2 18* 20* 24  GLUCOSE 135* 136* 94  BUN 85* 97* 96*  CREATININE 2.66* 3.00* 2.80*  CALCIUM 8.6* 8.7* 9.1   Recent Labs  Lab 07/14/20 1400 07/20/20 1530  WBC 8.3 10.8*  NEUTROABS 6.2 8.1*  HGB 9.9* 8.2*  HCT 31.0* 27.2*  MCV 93.1 94.1  PLT 246 213

## 2020-07-21 NOTE — Progress Notes (Signed)
Heart Failure Nurse Navigator Progress Note  Navigation team to follow this admission. Continue to watch AKI on CKD IV, current SCr 2.8. If renal recovery; will screen for HV TOC readiness.   Pricilla Holm, RN, BSN Heart Failure Nurse Navigator [phone]

## 2020-07-22 ENCOUNTER — Ambulatory Visit: Payer: Medicare Other | Admitting: Physical Therapy

## 2020-07-22 DIAGNOSIS — D631 Anemia in chronic kidney disease: Secondary | ICD-10-CM | POA: Diagnosis not present

## 2020-07-22 DIAGNOSIS — I509 Heart failure, unspecified: Secondary | ICD-10-CM | POA: Diagnosis not present

## 2020-07-22 DIAGNOSIS — N189 Chronic kidney disease, unspecified: Secondary | ICD-10-CM | POA: Diagnosis not present

## 2020-07-22 LAB — BASIC METABOLIC PANEL
Anion gap: 8 (ref 5–15)
BUN: 90 mg/dL — ABNORMAL HIGH (ref 8–23)
CO2: 27 mmol/L (ref 22–32)
Calcium: 9.1 mg/dL (ref 8.9–10.3)
Chloride: 102 mmol/L (ref 98–111)
Creatinine, Ser: 2.61 mg/dL — ABNORMAL HIGH (ref 0.44–1.00)
GFR, Estimated: 19 mL/min — ABNORMAL LOW (ref >60)
Glucose, Bld: 133 mg/dL — ABNORMAL HIGH (ref 70–99)
Potassium: 4.3 mmol/L (ref 3.5–5.1)
Sodium: 137 mmol/L (ref 135–145)

## 2020-07-22 LAB — CBC
HCT: 26.8 % — ABNORMAL LOW (ref 36.0–46.0)
Hemoglobin: 8.4 g/dL — ABNORMAL LOW (ref 12.0–15.0)
MCH: 28.4 pg (ref 26.0–34.0)
MCHC: 31.3 g/dL (ref 30.0–36.0)
MCV: 90.5 fL (ref 80.0–100.0)
Platelets: 218 10*3/uL (ref 150–400)
RBC: 2.96 MIL/uL — ABNORMAL LOW (ref 3.87–5.11)
RDW: 15.1 % (ref 11.5–15.5)
WBC: 10 10*3/uL (ref 4.0–10.5)
nRBC: 0 % (ref 0.0–0.2)

## 2020-07-22 LAB — MAGNESIUM: Magnesium: 1.5 mg/dL — ABNORMAL LOW (ref 1.7–2.4)

## 2020-07-22 MED ORDER — TORSEMIDE 20 MG PO TABS
60.0000 mg | ORAL_TABLET | Freq: Every day | ORAL | Status: DC
  Administered 2020-07-22: 60 mg via ORAL
  Filled 2020-07-22: qty 3

## 2020-07-22 MED ORDER — MAGNESIUM SULFATE 2 GM/50ML IV SOLN
2.0000 g | Freq: Once | INTRAVENOUS | Status: AC
  Administered 2020-07-22: 2 g via INTRAVENOUS
  Filled 2020-07-22: qty 50

## 2020-07-22 NOTE — Progress Notes (Signed)
Inpatient Rehab Admissions Coordinator:   Consult received and chart reviewed.  [employer] Medicare would not approve this diagnosis or mobility level (350' supervision) for CIR.  Agree with PT recs for home health.    Shann Medal, PT, DPT Admissions Coordinator [phone] 07/22/20  12:55 PM

## 2020-07-22 NOTE — Progress Notes (Signed)
Admit: 07/20/2020 LOS: 2  25F CKD4 with hx/ HFpEF admit with weight gaini, [name], SOB  Subjective:  . Feels improved no SOB, OOB to chair this AM . 3.6L UOP yesterday, weights down 6kg . SR improved to 2.6, K 4.3  05/05 0701 - 05/06 0700 In: 363 [P.O.:360; I.V.:3] Out: 3600 [Urine:3600]  Filed Weights   07/20/20 1524 07/21/20 0145 07/22/20 0300  Weight: 88.9 kg 86.9 kg 82.7 kg    Scheduled Meds: . allopurinol  50 mg Oral QODAY  . amLODipine  10 mg Oral Daily  . apixaban  5 mg Oral BID  . atorvastatin  40 mg Oral Daily  . budesonide  3 mg Oral Daily  . colestipol  2 g Oral QHS  . Ferrous Fumarate  1 tablet Oral Daily  . hydrALAZINE  100 mg Oral TID  . isosorbide mononitrate  15 mg Oral Daily  . linagliptin  5 mg Oral Daily  . melatonin  10 mg Oral QHS  . nebivolol  10 mg Oral Daily  . pantoprazole  40 mg Oral QAC breakfast  . pregabalin  25 mg Oral BID  . sodium chloride flush  3 mL Intravenous Q12H  . torsemide  60 mg Oral Daily   Continuous Infusions: . sodium chloride     PRN Meds:.sodium chloride, acetaminophen, HYDROmorphone, ondansetron (ZOFRAN) IV, sodium chloride flush  Current Labs: reviewed  Results for [name], Katie [name] (MRN [id number]) as of 07/21/2020 11:55  Ref. Range 07/21/2020 04:18  Saturation Ratios Latest Ref Range: 10.4 - 31.8 % 19  Ferritin Latest Ref Range: 11 - 307 ng/mL 132    Physical Exam:  Blood pressure (!) 139/54, pulse 72, temperature 98.1 F (36.7 C), temperature source Oral, resp. rate 19, height 5' 4"  (1.626 m), weight 82.7 kg, SpO2 97 %. NAD lying flat RRR Bibasilar crackles, nl wob 1-2+ pitting [name] b/l No rshes/lesions nonfocal AAO x3  A 1. AoCKD4; BL SCr variable ? 1.8-2.6; worsened at admission; UA bland. Renal US w/o acute issues 2. Hypervolemia / [name] / SOB; AoC HFpEF; responding to diuretics 3. Anemia, TSAT 19% and Ferritin 132 4. Hx/o L peroneal DVT on DOAC 5. HTN, stable 6. Hx/o IBD 7. DM2 8. CAD  P . Change to  Torsemide 44m qAM today . If stable GFR and volume status could consider DC as early as tomorrow . Cont PO Fe, might need ESA, trend Hb  . Will go ahed and arrange [name] f/u appt   [name] GrippeMD 07/22/2020, 11:31 AM  Recent Labs  Lab 07/20/20 1530 07/21/20 0418 07/22/20 0050  NA 141 141 137  K 5.0 4.8 4.3  CL 112* 108 102  CO2 20* 24 27  GLUCOSE 136* 94 133*  BUN 97* 96* 90*  CREATININE 3.00* 2.80* 2.61*  CALCIUM 8.7* 9.1 9.1   Recent Labs  Lab 07/20/20 1530 07/22/20 0050  WBC 10.8* 10.0  NEUTROABS 8.1*  --   HGB 8.2* 8.4*  HCT 27.2* 26.8*  MCV 94.1 90.5  PLT 213 218

## 2020-07-22 NOTE — Plan of Care (Signed)
  Problem: Education: Goal: Knowledge of General Education information will improve Description Including pain rating scale, medication(s)/side effects and non-pharmacologic comfort measures Outcome: Progressing   

## 2020-07-22 NOTE — Care Management Important Message (Signed)
Important Message  Patient Details  Name: [name] MRN: [id number] Date of Birth: 1945-12-11   Medicare Important Message Given:  Yes     Orbie Pyo 07/22/2020, 2:00 PM

## 2020-07-22 NOTE — Progress Notes (Signed)
Progress Note    [name]  BZJ:[id number] DOB: 02-Nov-1945  DOA: 07/20/2020 PCP: Seward Carol, MD    Brief Narrative:     Medical records reviewed and are as summarized below:  [name] is an 75 y.o. female  with medical history significant of CKD stage IV, chronic diastolic CHF, IIDM, CAD, LLE DVT on Eliquis, pulmonary hypertension, HTN, Gour, Crohn's disease, chronic iron deficiency anemia and chronic anemia secondary to CKD, presented with increasing shortness of breath and leg swelling. Recent hospitalization at [facility] < 1 month ago.    Assessment/Plan:   Active Problems:   AKI (acute kidney injury) ([place])   CHF (congestive heart failure) (HCC)   Acute on chronic diastolic CHF decompensation -echo: Left ventricular ejection fraction, by estimation, is 55 to 60%. The  left ventricle has normal function. The left ventricle has no regional  wall motion abnormalities. There is mild left ventricular hypertrophy.  Left ventricular diastolic parameters  are consistent with Grade I diastolic dysfunction (impaired relaxation).  -Significant fluid overload.  Probably secondary from worsening of CKD as well as noncompliant with salt and fluid intake- per Dr. Roosevelt Locks -diuretics per renal -nutrition consult  AKI ON CKD stage IV, with worsening of uremia and azotemia -nephrologist. -Renal ultrasound unremarkable  chronic anemia -Denied any obvious bleeding. -Iron level and reticulocyte count ok  DVT -Continue Eliquis- plan is for 3 months starting 06/2020  hypomagnesmia -replete  DM type 2 -Continue Tradjenta, add sliding scale  Crohn's disease -Continue Entocort.  obesity Body mass index is 31.3 kg/m.   PT Eval- home health- daughter requesting CIR-- will place consults  Family Communication/Anticipated D/C date and plan/Code Status    Code Status: Full Code.  Disposition Plan: Status is: Inpatient Spoke with daughter Remains inpatient  appropriate because:Inpatient level of care appropriate due to severity of illness   Dispo: The patient is from: Home              Anticipated d/c is to: Home? -- CIR to eval              Patient currently is not medically stable to d/c.-- possible d/c in AM   Difficult to place patient No         Medical Consultants:    renal  Subjective:   No current complaints - has not been eating right at home  Objective:    Vitals:   07/22/20 0300 07/22/20 0708 07/22/20 0721 07/22/20 1041  BP: (!) 162/67 (!) 157/56  (!) 139/54  Pulse: 76 79  72  Resp: (!) 22 17  19   Temp: 98.3 F (36.8 C) 98.5 F (36.9 C)  98.1 F (36.7 C)  TempSrc: Oral Oral  Oral  SpO2: 97% 97% 97% 97%  Weight: 82.7 kg     Height:        Intake/Output Summary (Last 24 hours) at 07/22/2020 1131 Last data filed at 07/22/2020 0300 Gross per 24 hour  Intake 363 ml  Output 2000 ml  Net -1637 ml   Filed Weights   07/20/20 1524 07/21/20 0145 07/22/20 0300  Weight: 88.9 kg 86.9 kg 82.7 kg    Exam:  General: Appearance:    Obese female in no acute distress     Lungs:      respirations unlabored  Heart:    Normal heart rate. Normal rhythm. No murmurs, rubs, or gallops.   MS:   All extremities are intact. Improved LE edema  Neurologic:  Awake, alert, oriented x 3.     Data Reviewed:   I have personally reviewed following labs and imaging studies:  Labs: Labs show the following:   Basic Metabolic Panel: Recent Labs  Lab 07/19/20 1458 07/20/20 1530 07/20/20 1614 07/21/20 0418 07/22/20 0050  NA 144 141  --  141 137  K 5.1 5.0  --  4.8 4.3  CL 108* 112*  --  108 102  CO2 18* 20*  --  24 27  GLUCOSE 135* 136*  --  94 133*  BUN 85* 97*  --  96* 90*  CREATININE 2.66* 3.00*  --  2.80* 2.61*  CALCIUM 8.6* 8.7*  --  9.1 9.1  MG  --   --  1.3*  --  1.5*   GFR Estimated Creatinine Clearance: 20 mL/min (A) (by C-G formula based on SCr of 2.61 mg/dL (H)). Liver Function Tests: Recent Labs  Lab  07/20/20 1530  AST 22  ALT 35  ALKPHOS 58  BILITOT 0.6  PROT 6.1*  ALBUMIN 3.0*   No results for input(s): LIPASE, AMYLASE in the last 168 hours. No results for input(s): AMMONIA in the last 168 hours. Coagulation profile No results for input(s): INR, PROTIME in the last 168 hours.  CBC: Recent Labs  Lab 07/20/20 1530 07/22/20 0050  WBC 10.8* 10.0  NEUTROABS 8.1*  --   HGB 8.2* 8.4*  HCT 27.2* 26.8*  MCV 94.1 90.5  PLT 213 218   Cardiac Enzymes: No results for input(s): CKTOTAL, CKMB, CKMBINDEX, TROPONINI in the last 168 hours. BNP (last 3 results) No results for input(s): PROBNP in the last 8760 hours. CBG: No results for input(s): GLUCAP in the last 168 hours. D-Dimer: No results for input(s): DDIMER in the last 72 hours. Hgb A1c: No results for input(s): HGBA1C in the last 72 hours. Lipid Profile: No results for input(s): CHOL, HDL, LDLCALC, TRIG, CHOLHDL, LDLDIRECT in the last 72 hours. Thyroid function studies: No results for input(s): TSH, T4TOTAL, T3FREE, THYROIDAB in the last 72 hours.  Invalid input(s): FREET3 Anemia work up: Recent Labs    07/21/20 0418  FERRITIN 132  TIBC 322  IRON 60  RETICCTPCT 3.4*   Sepsis Labs: Recent Labs  Lab 07/20/20 1530 07/22/20 0050  WBC 10.8* 10.0    Microbiology Recent Results (from the past 240 hour(s))  Resp Panel by RT-PCR (Flu A&B, Covid) Nasopharyngeal Swab     Status: None   Collection Time: 07/20/20  5:16 PM   Specimen: Nasopharyngeal Swab; Nasopharyngeal(NP) swabs in vial transport medium  Result Value Ref Range Status   SARS Coronavirus 2 by RT PCR NEGATIVE NEGATIVE Final    Comment: (NOTE) SARS-CoV-2 target nucleic acids are NOT DETECTED.  The SARS-CoV-2 RNA is generally detectable in upper respiratory specimens during the acute phase of infection. The lowest concentration of SARS-CoV-2 viral copies this assay can detect is 138 copies/mL. A negative result does not preclude SARS-Cov-2 infection  and should not be used as the sole basis for treatment or other patient management decisions. A negative result may occur with  improper specimen collection/handling, submission of specimen other than nasopharyngeal swab, presence of viral mutation(s) within the areas targeted by this assay, and inadequate number of viral copies(<138 copies/mL). A negative result must be combined with clinical observations, patient history, and epidemiological information. The expected result is Negative.  Fact Sheet for Patients:  EntrepreneurPulse.com.au  Fact Sheet for Healthcare Providers:  IncredibleEmployment.be  This test is no t yet approved or  cleared by the [place] and  has been authorized for detection and/or diagnosis of SARS-CoV-2 by FDA under an Emergency Use Authorization (EUA). This EUA will remain  in effect (meaning this test can be used) for the duration of the COVID-19 declaration under Section 564(b)(1) of the Act, 21 U.S.C.section 360bbb-3(b)(1), unless the authorization is terminated  or revoked sooner.       Influenza A by PCR NEGATIVE NEGATIVE Final   Influenza B by PCR NEGATIVE NEGATIVE Final    Comment: (NOTE) The Xpert Xpress SARS-CoV-2/FLU/RSV plus assay is intended as an aid in the diagnosis of influenza from Nasopharyngeal swab specimens and should not be used as a sole basis for treatment. Nasal washings and aspirates are unacceptable for Xpert Xpress SARS-CoV-2/FLU/RSV testing.  Fact Sheet for Patients: EntrepreneurPulse.com.au  Fact Sheet for Healthcare Providers: IncredibleEmployment.be  This test is not yet approved or cleared by the [place] FDA and has been authorized for detection and/or diagnosis of SARS-CoV-2 by FDA under an Emergency Use Authorization (EUA). This EUA will remain in effect (meaning this test can be used) for the duration of the COVID-19 declaration  under Section 564(b)(1) of the Act, 21 U.S.C. section 360bbb-3(b)(1), unless the authorization is terminated or revoked.  Performed at [facility], [place] [address], [place], [place] [address]   MRSA PCR Screening     Status: None   Collection Time: 07/21/20  1:41 AM   Specimen: Nasal Mucosa; Nasopharyngeal  Result Value Ref Range Status   MRSA by PCR NEGATIVE NEGATIVE Final    Comment:        The GeneXpert MRSA Assay (FDA approved for NASAL specimens only), is one component of a comprehensive MRSA colonization surveillance program. It is not intended to diagnose MRSA infection nor to guide or monitor treatment for MRSA infections. Performed at [facility], [place] [address], [place], [place] [address]     Procedures and diagnostic studies:  DG Chest 2 View  Result Date: 07/20/2020 CLINICAL DATA:  Shortness of breath EXAM: CHEST - 2 VIEW COMPARISON:  June 28, 2020 FINDINGS: Lungs are clear. Heart is mildly enlarged with pulmonary vascularity normal. No adenopathy. Stent noted in left anterior descending coronary artery. There are surgical clips in the right upper quadrant of the abdomen as well as in the right breast and right axillary regions. There is upper lumbar levoscoliosis. IMPRESSION: Heart mildly enlarged. No edema or airspace opacity. Stent in left anterior descending coronary artery. Areas of postoperative change. Electronically Signed   By: Lowella Grip III M.D.   On: 07/20/2020 16:06   US RENAL  Result Date: 07/20/2020 CLINICAL DATA:  75 year old female with history of acute kidney injury. EXAM: RENAL / URINARY TRACT ULTRASOUND COMPLETE COMPARISON:  No priors. FINDINGS: Right Kidney: Renal measurements: 9.8 x 5.2 x 4.9 cm = volume: 129 mL. Echogenicity within normal limits. Multiple anechoic lesions with increased through transmission, largest of which is in the upper pole there is a well-defined 2.1 x 1.8 x 1.8 cm lesion, compatible with a simple cyst. No  hydronephrosis visualized. Left Kidney: Renal measurements: 10.1 x 5.2 x 4.2 cm = volume: 116 mL. Echogenicity within normal limits. In the lower pole there is a well-defined 2.1 x 1.6 x 1.7 cm anechoic lesion with increased through transmission, compatible with a simple cyst. No hydronephrosis visualized. Bladder: Appears normal for degree of bladder distention. Other: None. IMPRESSION: 1. No acute findings to account for the patient's presentation. Specifically, no evidence  of hydronephrosis. 2. Small simple cysts in the kidneys bilaterally, as above. Electronically Signed   By: Vinnie Langton M.D.   On: 07/20/2020 19:52    Medications:   . allopurinol  50 mg Oral QODAY  . amLODipine  10 mg Oral Daily  . apixaban  5 mg Oral BID  . atorvastatin  40 mg Oral Daily  . budesonide  3 mg Oral Daily  . colestipol  2 g Oral QHS  . Ferrous Fumarate  1 tablet Oral Daily  . hydrALAZINE  100 mg Oral TID  . isosorbide mononitrate  15 mg Oral Daily  . linagliptin  5 mg Oral Daily  . melatonin  10 mg Oral QHS  . nebivolol  10 mg Oral Daily  . pantoprazole  40 mg Oral QAC breakfast  . pregabalin  25 mg Oral BID  . sodium chloride flush  3 mL Intravenous Q12H  . torsemide  60 mg Oral Daily   Continuous Infusions: . sodium chloride       LOS: 2 days   Geradine Girt  [facility]   How to contact the [facility] Attending or Consulting provider [place] or covering provider during after hours [place], for this patient?  1. Check the care team in [facility] and look for a) attending/consulting [facility] provider listed and b) the [facility] team listed 2. Log into [email or web address] and use [place]'s universal password to access. If you do not have the password, please contact the hospital operator. 3. Locate the [facility] provider you are looking for under [facility] and page to a number that you can be directly reached. 4. If you still have difficulty reaching the provider, please page the [facility] (Director on Call) for  the Hospitalists listed on amion for assistance.  07/22/2020, 11:31 AM

## 2020-07-22 NOTE — Progress Notes (Signed)
Initial Nutrition Assessment  DOCUMENTATION CODES:   Not applicable  INTERVENTION:   Liberalize diet to 2g sodium, Carb Modified. Pt does not need Renal diet restriction  Provided "Low Sodium Nutrition Therapy" handout from [facility] and provided verbal education   NUTRITION DIAGNOSIS:   Food and nutrition related knowledge deficit related to  (low sodium diet) as evidenced by  (consult for diet educaiton).  GOAL:   Patient will meet greater than or equal to 90% of their needs  MONITOR:   PO intake,Labs,Weight trends  REASON FOR ASSESSMENT:   Consult Diet education  ASSESSMENT:    75 yo female admitted with acute on chronic CHF, AKI on CKD IV   Pt reports appetite has been good currently and PTA. Pt recorded po intake 90-100% of meals  Current wt 82.7 kg  Lab Results  Component Value Date   HGBA1C 6.6 (H) 06/29/2020   DM relatively well controlled. Not checking CBGs at present  Pt reports 25 pound weight gain in 1 week PTA. Pt reports all related to fluid. Pt reports she weighs herself daily but does not know her "dry weight." Explained to patient that if she gains more than 2 pounds in 1 day or 5 pounds or more in 1 week, she needs to call her provider. Pt reports she did not know this  Labs: reviewed Meds: tradjenta, torsemide    Diet Order:   Diet Order            Diet renal/carb modified with fluid restriction Diet-HS Snack? Nothing; Fluid restriction: 1200 mL Fluid; Room service appropriate? Yes; Fluid consistency: Thin  Diet effective now                 EDUCATION NEEDS:   Education needs have been addressed  Skin:  Skin Assessment: Reviewed RN Assessment  Last BM:  5/5  Height:   Ht Readings from Last 1 Encounters:  07/21/20 5' 4"  (1.626 m)    Weight:   Wt Readings from Last 1 Encounters:  07/22/20 82.7 kg    BMI:  Body mass index is 31.3 kg/m.  Estimated Nutritional Needs:   Kcal:  1800-2000 kcals  Protein:  85-100 g  Fluid:   >/= 1.8 L   Kerman Passey MS, RDN, LDN, CNSC Registered Dietitian III Clinical Nutrition RD Pager and On-Call Pager Number Located in [place]

## 2020-07-23 DIAGNOSIS — N189 Chronic kidney disease, unspecified: Secondary | ICD-10-CM | POA: Diagnosis not present

## 2020-07-23 DIAGNOSIS — I509 Heart failure, unspecified: Secondary | ICD-10-CM | POA: Diagnosis not present

## 2020-07-23 DIAGNOSIS — D631 Anemia in chronic kidney disease: Secondary | ICD-10-CM | POA: Diagnosis not present

## 2020-07-23 LAB — CBC
HCT: 25.9 % — ABNORMAL LOW (ref 36.0–46.0)
Hemoglobin: 8.3 g/dL — ABNORMAL LOW (ref 12.0–15.0)
MCH: 28.4 pg (ref 26.0–34.0)
MCHC: 32 g/dL (ref 30.0–36.0)
MCV: 88.7 fL (ref 80.0–100.0)
Platelets: 223 10*3/uL (ref 150–400)
RBC: 2.92 MIL/uL — ABNORMAL LOW (ref 3.87–5.11)
RDW: 14.9 % (ref 11.5–15.5)
WBC: 10.1 10*3/uL (ref 4.0–10.5)
nRBC: 0 % (ref 0.0–0.2)

## 2020-07-23 LAB — BASIC METABOLIC PANEL
Anion gap: 8 (ref 5–15)
BUN: 91 mg/dL — ABNORMAL HIGH (ref 8–23)
CO2: 27 mmol/L (ref 22–32)
Calcium: 8.9 mg/dL (ref 8.9–10.3)
Chloride: 101 mmol/L (ref 98–111)
Creatinine, Ser: 2.95 mg/dL — ABNORMAL HIGH (ref 0.44–1.00)
GFR, Estimated: 16 mL/min — ABNORMAL LOW (ref >60)
Glucose, Bld: 141 mg/dL — ABNORMAL HIGH (ref 70–99)
Potassium: 4.2 mmol/L (ref 3.5–5.1)
Sodium: 136 mmol/L (ref 135–145)

## 2020-07-23 LAB — MAGNESIUM: Magnesium: 1.7 mg/dL (ref 1.7–2.4)

## 2020-07-23 MED ORDER — AMLODIPINE BESYLATE 5 MG PO TABS
5.0000 mg | ORAL_TABLET | Freq: Every day | ORAL | Status: DC
  Administered 2020-07-24: 5 mg via ORAL
  Filled 2020-07-23: qty 1

## 2020-07-23 MED ORDER — MAGNESIUM SULFATE 2 GM/50ML IV SOLN
2.0000 g | Freq: Once | INTRAVENOUS | Status: AC
  Administered 2020-07-23: 2 g via INTRAVENOUS
  Filled 2020-07-23: qty 50

## 2020-07-23 MED ORDER — TORSEMIDE 20 MG PO TABS
40.0000 mg | ORAL_TABLET | Freq: Every day | ORAL | Status: DC
  Administered 2020-07-23 – 2020-07-24 (×2): 40 mg via ORAL
  Filled 2020-07-23 (×2): qty 2

## 2020-07-23 NOTE — Progress Notes (Signed)
Admit: 07/20/2020 LOS: 3  46F CKD4 with hx/ HFpEF admit with weight gaini, [name], SOB  Subjective:  . Feels well, on room air  . 1.4 L urine, received torsemide 60 mg by mouth yesterday . Creatinine up to 2.95 from 2.6   05/06 0701 - 05/07 0700 In: 560 [P.O.:560] Out: 1375 [Urine:1375]  Filed Weights   07/22/20 0300 07/22/20 2051 07/23/20 0257  Weight: 82.7 kg 109.3 kg 80.5 kg    Scheduled Meds: . allopurinol  50 mg Oral QODAY  . amLODipine  10 mg Oral Daily  . apixaban  5 mg Oral BID  . atorvastatin  40 mg Oral Daily  . budesonide  3 mg Oral Daily  . colestipol  2 g Oral QHS  . Ferrous Fumarate  1 tablet Oral Daily  . hydrALAZINE  100 mg Oral TID  . isosorbide mononitrate  15 mg Oral Daily  . linagliptin  5 mg Oral Daily  . melatonin  10 mg Oral QHS  . nebivolol  10 mg Oral Daily  . pantoprazole  40 mg Oral QAC breakfast  . pregabalin  25 mg Oral BID  . sodium chloride flush  3 mL Intravenous Q12H  . torsemide  40 mg Oral Daily   Continuous Infusions: . sodium chloride     PRN Meds:.sodium chloride, acetaminophen, HYDROmorphone, ondansetron (ZOFRAN) IV, sodium chloride flush  Current Labs: reviewed  Results for Joens, Driana [name] (MRN [id number]) as of 07/21/2020 11:55  Ref. Range 07/21/2020 04:18  Saturation Ratios Latest Ref Range: 10.4 - 31.8 % 19  Ferritin Latest Ref Range: 11 - 307 ng/mL 132    Physical Exam:  Blood pressure (!) 120/50, pulse 79, temperature 98.1 F (36.7 C), temperature source Oral, resp. rate 18, height 5' 4"  (1.626 m), weight 80.5 kg, SpO2 96 %. NAD lying flat RRR Bibasilar crackles, nl wob 1-2+ pitting [name] b/l No rshes/lesions nonfocal AAO x3  A 1. AoCKD4; BL SCr variable ? 1.8-2.6; worsened at admission; UA bland. Renal US w/o acute issues 2. Hypervolemia / [name] / SOB; AoC HFpEF; responding to diuretics 3. Anemia, TSAT 19% and Ferritin 132 4. Hx/o L peroneal DVT on DOAC 5. HTN, stable 6. Hx/o IBD 7. DM2 8. CAD  P . Reduce  torsemide to 40 mg this morning . Okay with discharge if renal function is stable or improving tomorrow . Cont PO Fe, might need ESA, trend Hb  . I have arranged [name] f/u appt  . Daily weights, Daily Renal Panel, Strict I/Os, Avoid nephrotoxins (NSAIDs, judicious IV Contrast)   Pearson [name] MD 07/23/2020, 8:48 AM  Recent Labs  Lab 07/21/20 0418 07/22/20 0050 07/23/20 0103  NA 141 137 136  K 4.8 4.3 4.2  CL 108 102 101  CO2 24 27 27   GLUCOSE 94 133* 141*  BUN 96* 90* 91*  CREATININE 2.80* 2.61* 2.95*  CALCIUM 9.1 9.1 8.9   Recent Labs  Lab 07/20/20 1530 07/22/20 0050 07/23/20 0103  WBC 10.8* 10.0 10.1  NEUTROABS 8.1*  --   --   HGB 8.2* 8.4* 8.3*  HCT 27.2* 26.8* 25.9*  MCV 94.1 90.5 88.7  PLT 213 218 223

## 2020-07-23 NOTE — Evaluation (Signed)
Occupational Therapy Evaluation Patient Details Name: [name] MRN: [id number] DOB: 1945/12/08 Today's Date: 07/23/2020    History of Present Illness 75 year old female admitted 5/4 with shortness of breath and edema with AKI on CKD. 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 Pt very motivated to maintain meaningful occupations. Today she presents at supervision level for transfers with RW (plan to bring rollator next session). OT donned ted hose at max A for BLE dressing. Educated Pt in strategy for donning ted hose using plastic bag they come in. Pt with very limited ROM in L shoulder. Today 20 degree FF and abduction. Increased ROM with PROM (assist from RUE) but very painful and limiting. Hand, wrist, elbow all WFL. Per note on 5/3 by DR Kirsteins "Concerned that she may have rotator cuff tear but certainly fairly acute at this point may be just a tendinopathy/tendinitis. If no better in 1 month consider ultrasound versus MRI" and he ordered OPOT to address LUE deficits. OT will follow acutely and establish HEP for LUE, in addition to adressing ADL deficits.   Of note: Pt is concerned and feels like her family is trying to make her quit all her activities. OT encouraged her to continue meaningful occupations but also verbalized that sometimes taking a break to get back strength, balance, coordination, activity tolerance and getting safer before returning back is a priority.     Follow Up Recommendations  Home health OT;Outpatient OT;Supervision/Assistance - 24 hour    Equipment Recommendations  None recommended by OT (Pt has appropriate DME)    Recommendations for Other Services       Precautions / Restrictions  Precautions Precautions: Fall Restrictions Weight Bearing Restrictions: No      Mobility Bed Mobility               General bed mobility comments: OOB in recliner at beginning and end of session    Transfers Overall transfer level: Needs assistance   Transfers: Sit to/from Stand Sit to Stand: Supervision         General transfer comment: supervision for safety    Balance Overall balance assessment: Needs assistance;History of Falls   Sitting balance-Leahy Scale: Good     Standing balance support: Bilateral upper extremity supported Standing balance-Leahy Scale: Poor Standing balance comment: reliant on single or bil UE support in standing and RW for gait                           ADL either performed or assessed with clinical judgement   ADL Overall ADL's : Needs assistance/impaired Eating/Feeding: Set up   Grooming: Wash/dry hands;Wash/dry face;Min guard;Standing Grooming Details (indicate cue type and reason): Pt with limited ROM in LUE (non dominant) impacting BUE function during grooming tasks Upper Body Bathing: Min guard Upper Body Bathing Details (indicate cue type and reason): Pt reports that she uses tub bench for bathing Lower Body Bathing: Min guard;Sitting/lateral leans   Upper Body Dressing : Minimal assistance;Sitting Upper Body Dressing Details (indicate cue type and reason): to assist with LUE Lower Body Dressing: Maximal assistance Lower Body Dressing Details (indicate cue type and reason): to don new ted hose - educated Pt in plastic bag method Toilet  Transfer: Min guard;Ambulation;RW Toilet Transfer Details (indicate cue type and reason): Pt uses Rollator at baseline PRN Toileting- Clothing Manipulation and Hygiene: Min guard;Sit to/from Nurse, children's Details (indicate cue type and reason): Pt uses tub bench at baseline Functional mobility during ADLs: Min guard;Rolling walker;Cueing for safety General ADL  Comments: decreased function in LUE since fall (4/28) impacting BUE tasks and ADL - painful     Vision         Perception     Praxis      Pertinent Vitals/Pain Pain Assessment: 0-10 Pain Score: 7  Pain Location: L shoulder (also chronic back pain) Pain Descriptors / Indicators: Discomfort;Grimacing;Sore Pain Intervention(s): Monitored during session;Repositioned     Hand Dominance Right   Extremity/Trunk Assessment Upper Extremity Assessment Upper Extremity Assessment: LUE deficits/detail LUE Deficits / Details: AROM of shoulder FF 20 degrees, Abduction 30 degrees, improved with PROM - but still very limited by pain. Functionally can bring LUE to ear, but not back of head. Note 5/4 per Dr Letta Pate requesting OPOT and if no improvement increased imaging (ultrasound or MRI) in about a month LUE: Unable to fully assess due to pain LUE Coordination: decreased gross motor   Lower Extremity Assessment Lower Extremity Assessment: Defer to PT evaluation   Cervical / Trunk Assessment Cervical / Trunk Assessment: Normal   Communication Communication Communication: No difficulties   Cognition Arousal/Alertness: Awake/alert Behavior During Therapy: WFL for tasks assessed/performed Overall Cognitive Status: No family/caregiver present to determine baseline cognitive functioning                                 General Comments: Pt stating that she did not get any imaging on L shoulder at time of fall (imaging done in ED) multiple notes of STM deficits   General Comments       Exercises     Shoulder Instructions      Home Living Family/patient expects to be discharged to:: Private residence Living Arrangements: Alone Available Help at Discharge: Family;Available 24 hours/day (grandaughter has been staying with her recently) Type of Home: Apartment Home Access: Stairs to enter [employer] of Steps: 1   Home Layout: One level     Bathroom  Shower/Tub: Walk-in shower;Tub/shower unit   Bathroom Toilet: Standard Bathroom Accessibility: Yes How Accessible: Accessible via walker Home Equipment: [place] - 4 wheels;Tub bench;Grab bars - toilet;Grab bars - tub/shower;Hand held shower head          Prior Functioning/Environment Level of Independence: Independent with assistive device(s)  Gait / Transfers Assistance Needed: uses rollator     Comments: drives, grocery shopping, several falls in the last 6 months, enjoys church and working at daycare        OT Problem List: Decreased range of motion;Impaired balance (sitting and/or standing);Decreased knowledge of use of DME or AE;Increased edema;Pain      OT Treatment/Interventions: Self-care/ADL training;DME and/or AE instruction;Therapeutic activities;Patient/family education;Balance training;Other (comment) (LUE ROM)    OT Goals(Current goals can be found in the care plan section) Acute Rehab OT Goals Patient Stated Goal: get home and keep going to church OT Goal Formulation: With patient Time For Goal Achievement: 08/06/20 Potential to Achieve Goals: Good ADL Goals Pt Will Perform Grooming: with modified independence;standing Pt Will Perform Upper Body Dressing: with modified independence;sitting Pt Will Perform Lower Body Dressing: with modified independence;sit to/from stand Pt Will Transfer to Toilet: with modified independence;ambulating Pt  Will Perform Toileting - Clothing Manipulation and hygiene: with modified independence;sit to/from stand Pt/caregiver will Perform Home Exercise Program: Increased ROM;Left upper extremity;With Supervision;With written HEP provided Additional ADL Goal #1: Pt will verbalize 3 strategies for energy conservation during ADL routine with no cues  OT Frequency: Min 2X/week   Barriers to D/C:            Co-evaluation              AM-PAC OT "6 Clicks" Daily Activity     Outcome Measure Help from another person eating  meals?: None Help from another person taking care of personal grooming?: A Little Help from another person toileting, which includes using toliet, bedpan, or urinal?: A Little Help from another person bathing (including washing, rinsing, drying)?: A Little Help from another person to put on and taking off regular upper body clothing?: A Little Help from another person to put on and taking off regular lower body clothing?: A Little 6 Click Score: 19   End of Session Equipment Utilized During Treatment: Gait belt;Rolling walker Nurse Communication: Mobility status;Precautions  Activity Tolerance: Patient tolerated treatment well Patient left: in chair;with call bell/phone within reach;with chair alarm set  OT Visit Diagnosis: Repeated falls (R29.6);History of falling (Z91.81);Pain;Other abnormalities of gait and mobility (R26.89) Pain - Right/Left: Left Pain - part of body: Shoulder                Time: 8850-2774 OT Time Calculation (min): 24 min Charges:  OT General Charges $OT Visit: 1 Visit OT Evaluation $OT Eval Moderate Complexity: [place] OTR/L Acute Rehabilitation Services Pager: [phone] Office: [place] 07/23/2020, 6:14 PM

## 2020-07-23 NOTE — Progress Notes (Signed)
Progress Note    [name]  TFT:[id number] DOB: [birth year]  DOA: 07/20/2020 PCP: Seward Carol, MD    Brief Narrative:     Medical records reviewed and are as summarized below:  [name] is an 75 y.o. female  with medical history significant of CKD stage IV, chronic diastolic CHF, IIDM, CAD, LLE DVT on Eliquis, pulmonary hypertension, HTN, Gour, Crohn's disease, chronic iron deficiency anemia and chronic anemia secondary to CKD, presented with increasing shortness of breath and leg swelling. Recent hospitalization at [facility] < 1 month ago.    Assessment/Plan:   Active Problems:   AKI (acute kidney injury) ([place])   CHF (congestive heart failure) ([facility])   Acute on chronic diastolic CHF decompensation -echo: Left ventricular ejection fraction, by estimation, is 55 to 60%. The  left ventricle has normal function. The left ventricle has no regional  wall motion abnormalities. There is mild left ventricular hypertrophy.  Left ventricular diastolic parameters  are consistent with Grade I diastolic dysfunction (impaired relaxation).  -Significant fluid overload.  Probably secondary from worsening of CKD as well as noncompliant with salt and fluid intake- per Dr. Roosevelt Locks -diuretics per renal -nutrition consult  AKI ON CKD stage IV, with worsening of uremia and azotemia -nephrologist. -Renal ultrasound unremarkable  chronic anemia -Denied any obvious bleeding. -Iron level and reticulocyte count ok  DVT -Continue Eliquis- plan is for 3 months starting 06/2020  hypomagnesmia -replete  DM type 2 -Continue Tradjenta, add sliding scale  Crohn's disease -Continue Entocort.  obesity Body mass index is 30.46 kg/m.   OT consult  Family Communication/Anticipated D/C date and plan/Code Status    Code Status: Full Code.  Disposition Plan: Status is: Inpatient Spoke with daughter Remains inpatient appropriate because:Inpatient level of care appropriate due to  severity of illness   Dispo: The patient is from: Home              Anticipated d/c is to: Home              Patient currently is not medically stable to d/c.-- possible d/c in AM   Difficult to place patient No         Medical Consultants:    renal  Subjective:   No current complaints  Objective:    Vitals:   07/22/20 2051 07/22/20 2319 07/23/20 0257 07/23/20 0747  BP: (!) 183/122 (!) 131/55 (!) 149/61 (!) 120/50  Pulse: (!) 107 76 79 79  Resp: 18 16 18 18   Temp: (!) 96.2 F (35.7 C) 98.4 F (36.9 C) 98.3 F (36.8 C) 98.1 F (36.7 C)  TempSrc: Axillary Oral Oral Oral  SpO2:  96% 93% 96%  Weight: 109.3 kg  80.5 kg   Height:        Intake/Output Summary (Last 24 hours) at 07/23/2020 1039 Last data filed at 07/23/2020 1031 Gross per 24 hour  Intake 563 ml  Output 1375 ml  Net -812 ml   Filed Weights   07/22/20 0300 07/22/20 2051 07/23/20 0257  Weight: 82.7 kg 109.3 kg 80.5 kg    Exam:  General: Appearance:    Obese female in no acute distress     Lungs:     respirations unlabored  Heart:    Normal heart rate.  MS:   All extremities are intact. +LE edema  Neurologic:   Awake, alert      Data Reviewed:   I have personally reviewed following labs and imaging studies:  Labs: Labs  show the following:   Basic Metabolic Panel: Recent Labs  Lab 07/19/20 1458 07/20/20 1530 07/20/20 1614 07/21/20 0418 07/22/20 0050 07/23/20 0103  NA 144 141  --  141 137 136  K 5.1 5.0  --  4.8 4.3 4.2  CL 108* 112*  --  108 102 101  CO2 18* 20*  --  24 27 27   GLUCOSE 135* 136*  --  94 133* 141*  BUN 85* 97*  --  96* 90* 91*  CREATININE 2.66* 3.00*  --  2.80* 2.61* 2.95*  CALCIUM 8.6* 8.7*  --  9.1 9.1 8.9  MG  --   --  1.3*  --  1.5* 1.7   GFR Estimated Creatinine Clearance: 17.4 mL/min (A) (by C-G formula based on SCr of 2.95 mg/dL (H)). Liver Function Tests: Recent Labs  Lab 07/20/20 1530  AST 22  ALT 35  ALKPHOS 58  BILITOT 0.6  PROT 6.1*   ALBUMIN 3.0*   No results for input(s): LIPASE, AMYLASE in the last 168 hours. No results for input(s): AMMONIA in the last 168 hours. Coagulation profile No results for input(s): INR, PROTIME in the last 168 hours.  CBC: Recent Labs  Lab 07/20/20 1530 07/22/20 0050 07/23/20 0103  WBC 10.8* 10.0 10.1  NEUTROABS 8.1*  --   --   HGB 8.2* 8.4* 8.3*  HCT 27.2* 26.8* 25.9*  MCV 94.1 90.5 88.7  PLT 213 218 223   Cardiac Enzymes: No results for input(s): CKTOTAL, CKMB, CKMBINDEX, TROPONINI in the last 168 hours. BNP (last 3 results) No results for input(s): PROBNP in the last 8760 hours. CBG: No results for input(s): GLUCAP in the last 168 hours. D-Dimer: No results for input(s): DDIMER in the last 72 hours. Hgb A1c: No results for input(s): HGBA1C in the last 72 hours. Lipid Profile: No results for input(s): CHOL, HDL, LDLCALC, TRIG, CHOLHDL, LDLDIRECT in the last 72 hours. Thyroid function studies: No results for input(s): TSH, T4TOTAL, T3FREE, THYROIDAB in the last 72 hours.  Invalid input(s): FREET3 Anemia work up: Recent Labs    07/21/20 0418  FERRITIN 132  TIBC 322  IRON 60  RETICCTPCT 3.4*   Sepsis Labs: Recent Labs  Lab 07/20/20 1530 07/22/20 0050 07/23/20 0103  WBC 10.8* 10.0 10.1    Microbiology Recent Results (from the past 240 hour(s))  Resp Panel by RT-PCR (Flu A&B, Covid) Nasopharyngeal Swab     Status: None   Collection Time: 07/20/20  5:16 PM   Specimen: Nasopharyngeal Swab; Nasopharyngeal(NP) swabs in vial transport medium  Result Value Ref Range Status   SARS Coronavirus 2 by RT PCR NEGATIVE NEGATIVE Final    Comment: (NOTE) SARS-CoV-2 target nucleic acids are NOT DETECTED.  The SARS-CoV-2 RNA is generally detectable in upper respiratory specimens during the acute phase of infection. The lowest concentration of SARS-CoV-2 viral copies this assay can detect is 138 copies/mL. A negative result does not preclude SARS-Cov-2 infection and  should not be used as the sole basis for treatment or other patient management decisions. A negative result may occur with  improper specimen collection/handling, submission of specimen other than nasopharyngeal swab, presence of viral mutation(s) within the areas targeted by this assay, and inadequate number of viral copies(<138 copies/mL). A negative result must be combined with clinical observations, patient history, and epidemiological information. The expected result is Negative.  Fact Sheet for Patients:  EntrepreneurPulse.com.au  Fact Sheet for Healthcare Providers:  IncredibleEmployment.be  This test is no t yet approved or cleared  by the [place] and  has been authorized for detection and/or diagnosis of SARS-CoV-2 by FDA under an Emergency Use Authorization (EUA). This EUA will remain  in effect (meaning this test can be used) for the duration of the COVID-19 declaration under Section 564(b)(1) of the Act, 21 U.S.C.section 360bbb-3(b)(1), unless the authorization is terminated  or revoked sooner.       Influenza A by PCR NEGATIVE NEGATIVE Final   Influenza B by PCR NEGATIVE NEGATIVE Final    Comment: (NOTE) The Xpert Xpress SARS-CoV-2/FLU/RSV plus assay is intended as an aid in the diagnosis of influenza from Nasopharyngeal swab specimens and should not be used as a sole basis for treatment. Nasal washings and aspirates are unacceptable for Xpert Xpress SARS-CoV-2/FLU/RSV testing.  Fact Sheet for Patients: EntrepreneurPulse.com.au  Fact Sheet for Healthcare Providers: IncredibleEmployment.be  This test is not yet approved or cleared by the [place] FDA and has been authorized for detection and/or diagnosis of SARS-CoV-2 by FDA under an Emergency Use Authorization (EUA). This EUA will remain in effect (meaning this test can be used) for the duration of the COVID-19 declaration  under Section 564(b)(1) of the Act, 21 U.S.C. section 360bbb-3(b)(1), unless the authorization is terminated or revoked.  Performed at [facility], [place] [address] [place], [place] [address]   MRSA PCR Screening     Status: None   Collection Time: 07/21/20  1:41 AM   Specimen: Nasal Mucosa; Nasopharyngeal  Result Value Ref Range Status   MRSA by PCR NEGATIVE NEGATIVE Final    Comment:        The GeneXpert MRSA Assay (FDA approved for NASAL specimens only), is one component of a comprehensive MRSA colonization surveillance program. It is not intended to diagnose MRSA infection nor to guide or monitor treatment for MRSA infections. Performed at [facility], [place] [address] [place],  [address]     Procedures and diagnostic studies:  No results found.  Medications:   . allopurinol  50 mg Oral QODAY  . amLODipine  10 mg Oral Daily  . apixaban  5 mg Oral BID  . atorvastatin  40 mg Oral Daily  . budesonide  3 mg Oral Daily  . colestipol  2 g Oral QHS  . Ferrous Fumarate  1 tablet Oral Daily  . hydrALAZINE  100 mg Oral TID  . isosorbide mononitrate  15 mg Oral Daily  . linagliptin  5 mg Oral Daily  . melatonin  10 mg Oral QHS  . nebivolol  10 mg Oral Daily  . pantoprazole  40 mg Oral QAC breakfast  . pregabalin  25 mg Oral BID  . sodium chloride flush  3 mL Intravenous Q12H  . torsemide  40 mg Oral Daily   Continuous Infusions: . sodium chloride       LOS: 3 days   Geradine Girt  [facility]   How to contact the [facility] Attending or Consulting provider [place] or covering provider during after hours [place], for this patient?  1. Check the care team in [facility] and look for a) attending/consulting TR[facility] provider listed and b) the [facility] team listed 2. Log into [email or web address] and use [place]'s universal password to access. If you do not have the password, please contact the hospital operator. 3. Locate the [facility] provider you are looking for under  [facility] and page to a number that you can be directly reached. 4. If you still have difficulty  reaching the provider, please page the [facility] (Director on Call) for the Hospitalists listed on amion for assistance.  07/23/2020, 10:39 AM

## 2020-07-24 DIAGNOSIS — D631 Anemia in chronic kidney disease: Secondary | ICD-10-CM | POA: Diagnosis not present

## 2020-07-24 DIAGNOSIS — N189 Chronic kidney disease, unspecified: Secondary | ICD-10-CM | POA: Diagnosis not present

## 2020-07-24 DIAGNOSIS — I509 Heart failure, unspecified: Secondary | ICD-10-CM | POA: Diagnosis not present

## 2020-07-24 LAB — BASIC METABOLIC PANEL
Anion gap: 9 (ref 5–15)
BUN: 94 mg/dL — ABNORMAL HIGH (ref 8–23)
CO2: 26 mmol/L (ref 22–32)
Calcium: 8.6 mg/dL — ABNORMAL LOW (ref 8.9–10.3)
Chloride: 101 mmol/L (ref 98–111)
Creatinine, Ser: 2.76 mg/dL — ABNORMAL HIGH (ref 0.44–1.00)
GFR, Estimated: 18 mL/min — ABNORMAL LOW (ref >60)
Glucose, Bld: 142 mg/dL — ABNORMAL HIGH (ref 70–99)
Potassium: 4.1 mmol/L (ref 3.5–5.1)
Sodium: 136 mmol/L (ref 135–145)

## 2020-07-24 MED ORDER — APIXABAN 5 MG PO TABS: 5.0000 mg | ORAL_TABLET | Freq: Two times a day (BID) | ORAL | 2 refills | Status: DC

## 2020-07-24 NOTE — Progress Notes (Addendum)
Admit: 07/20/2020 LOS: 4  35F CKD4 with hx/ HFpEF admit with weight gaini, [name], SOB  Subjective:  . Feels well, on room air, in chair . 2 L urine output, creatinine improved to 2.8 from 2.95, K4.1.   05/07 0701 - 05/08 0700 In: 921 [P.O.:865; I.V.:6; IV Piggyback:50] Out: 2050 [Urine:2050]  Filed Weights   07/22/20 2051 07/23/20 0257 07/24/20 0340  Weight: 109.3 kg 80.5 kg 80.7 kg    Scheduled Meds: . allopurinol  50 mg Oral QODAY  . amLODipine  5 mg Oral Daily  . apixaban  5 mg Oral BID  . atorvastatin  40 mg Oral Daily  . budesonide  3 mg Oral Daily  . colestipol  2 g Oral QHS  . Ferrous Fumarate  1 tablet Oral Daily  . hydrALAZINE  100 mg Oral TID  . isosorbide mononitrate  15 mg Oral Daily  . linagliptin  5 mg Oral Daily  . melatonin  10 mg Oral QHS  . nebivolol  10 mg Oral Daily  . pantoprazole  40 mg Oral QAC breakfast  . pregabalin  25 mg Oral BID  . sodium chloride flush  3 mL Intravenous Q12H  . torsemide  40 mg Oral Daily   Continuous Infusions: . sodium chloride     PRN Meds:.sodium chloride, acetaminophen, HYDROmorphone, ondansetron (ZOFRAN) IV, sodium chloride flush  Current Labs: reviewed  Results for [place], Maryella [name] (MRN [id number]) as of 07/21/2020 11:55  Ref. Range 07/21/2020 04:18  Saturation Ratios Latest Ref Range: 10.4 - 31.8 % 19  Ferritin Latest Ref Range: 11 - 307 ng/mL 132    Physical Exam:  Blood pressure (!) 125/55, pulse 76, temperature 98 F (36.7 C), temperature source Oral, resp. rate 13, height 5' 4"  (1.626 m), weight 80.7 kg, SpO2 99 %. NAD lying flat RRR Bibasilar crackles, nl wob 1-2+ pitting [name] b/l No rshes/lesions nonfocal AAO x3  A 1. AoCKD4; BL SCr variable ? 1.8-2.6; worsened at admission; UA bland. Renal US w/o acute issues 2. Hypervolemia / [name] / SOB; AoC HFpEF; improved and responsive to diuretics 3. Anemia, TSAT 19% and Ferritin 132 4. Hx/o L peroneal DVT on DOAC 5. HTN, stable 6. Hx/o  IBD 7. DM2 8. CAD  P . Continue torsemide at current dosing  . Okay for discharge today  . No further suggestions, will sign off  . I have arranged [name] f/u appt  . Daily weights, Daily Renal Panel, Strict I/Os, Avoid nephrotoxins (NSAIDs, judicious IV Contrast)   Addendum: Was asked to touch base with daughter.  I updated on current status.  Reviewed recent history in regards to kidney function.  Answered questions including rationale for continued diuresis, focusing on dietary sodium restriction, fluid restriction, and that close follow-up is being arranged with Dr. Hollie Salk at our office.  All questions answered.   Pearson [name] MD 07/24/2020, 10:35 AM  Recent Labs  Lab 07/22/20 0050 07/23/20 0103 07/24/20 0033  NA 137 136 136  K 4.3 4.2 4.1  CL 102 101 101  CO2 27 27 26   GLUCOSE 133* 141* 142*  BUN 90* 91* 94*  CREATININE 2.61* 2.95* 2.76*  CALCIUM 9.1 8.9 8.6*   Recent Labs  Lab 07/20/20 1530 07/22/20 0050 07/23/20 0103  WBC 10.8* 10.0 10.1  NEUTROABS 8.1*  --   --   HGB 8.2* 8.4* 8.3*  HCT 27.2* 26.8* 25.9*  MCV 94.1 90.5 88.7  PLT 213 218 223

## 2020-07-24 NOTE — Discharge Summary (Signed)
Physician Discharge Summary  [name] JJK:[id number] DOB: 11/28/1945 DOA: 07/20/2020  PCP: Seward Carol, MD  Admit date: 07/20/2020 Discharge date: 07/24/2020  Admitted From: home Discharge disposition: home   Recommendations for Outpatient Follow-Up:   1. Home health 2. BMP 1 week 3. Needs strict low salt diet   Discharge Diagnosis:   Active Problems:   AKI (acute kidney injury) ([place])   CHF (congestive heart failure) ([place])    Discharge Condition: Improved.  Diet recommendation: Low sodium, heart healthy.  Wound care: None.  Code status: Full.   History of Present Illness:   [name] is a 75 y.o. female with medical history significant of CKD stage IV, chronic diastolic CHF, IIDM, CAD, LLE DVT on Eliquis, pulmonary hypertension, HTN, Gour, Crohn's disease, chronic iron deficiency anemia and chronic anemia secondary to CKD, presented with increasing shortness of breath and leg swelling.  Patient was hospitalized 3 weeks ago for fluid overload, was found to have acute edbat&\spelled Eliquis was started.  During the same hospitalization, patient's torsemide dosage also increased from alternating 20/40 to 40 mg daily.  Family reported the increased dosage of torsemide regimen worked well for patient for about a week.  Starting from 2 weeks ago, patient started noticed decreased urine output and same time she started to feel increasing shortness of breath and leg swelling.  She estimated she gained about 10 pounds compared to 3 weeks ago.  Family also reported that patient has not been strictly compliant with her salt and fluid intake, "has been snaking more".  This week, patient became more lethargic and developed episodic confusion.  Denies any chest pain, no cough no fever chills no dysuria no back pains. ED Course: Significant fluid overload.  Kidney function showed worsening of uremia and azotemia.   Hospital Course by Problem:   Acute on chronic  diastolic CHF decompensation -echo: Left ventricular ejection fraction, by estimation, is 55 to 60%. The  left ventricle has normal function. The left ventricle has no regional  wall motion abnormalities. There is mild left ventricular hypertrophy.  Left ventricular diastolic parameters  are consistent with Grade I diastolic dysfunction (impaired relaxation).  -Significant fluid overload. Probably secondary from worsening of CKD as well as noncompliant with salt and fluid intake- per Dr. Roosevelt Locks -diuretics per renal -nutrition consult  AKI ON CKD stage IV,with worsening of uremia and azotemia -nephrologist. -Renal ultrasound unremarkable  chronic anemia -Denied any obvious bleeding. -Iron level and reticulocyte count ok  DVT -Continue Eliquis- plan is for 3 months starting 06/2020  hypomagnesmia -replete  DM type 2 -Continue Tradjenta, add sliding scale  Crohn's disease -Continue Entocort.  obesity Body mass index is 30.46 kg/m.     Medical Consultants:    renal  Discharge Exam:   Vitals:   07/24/20 1002 07/24/20 1208  BP: (!) 125/55 (!) 115/56  Pulse:  76  Resp:  13  Temp:  98.1 F (36.7 C)  SpO2:  96%   Vitals:   07/24/20 0340 07/24/20 0821 07/24/20 1002 07/24/20 1208  BP: (!) 146/63 (!) 125/55 (!) 125/55 (!) 115/56  Pulse: 76 78  76  Resp: 13 15  13   Temp: 98 F (36.7 C) 98.5 F (36.9 C)  98.1 F (36.7 C)  TempSrc: Oral Oral  Oral  SpO2: 99% 96%  96%  Weight: 80.7 kg     Height:        General exam: Appears calm and comfortable.    The  results of significant diagnostics from this hospitalization (including imaging, microbiology, ancillary and laboratory) are listed below for reference.     Procedures and Diagnostic Studies:   DG Chest 2 View  Result Date: 07/20/2020 CLINICAL DATA:  Shortness of breath EXAM: CHEST - 2 VIEW COMPARISON:  June 28, 2020 FINDINGS: Lungs are clear. Heart is mildly enlarged with pulmonary vascularity  normal. No adenopathy. Stent noted in left anterior descending coronary artery. There are surgical clips in the right upper quadrant of the abdomen as well as in the right breast and right axillary regions. There is upper lumbar levoscoliosis. IMPRESSION: Heart mildly enlarged. No edema or airspace opacity. Stent in left anterior descending coronary artery. Areas of postoperative change. Electronically Signed   By: Lowella Grip III M.D.   On: 07/20/2020 16:06   US RENAL  Result Date: 07/20/2020 CLINICAL DATA:  75 year old female with history of acute kidney injury. EXAM: RENAL / URINARY TRACT ULTRASOUND COMPLETE COMPARISON:  No priors. FINDINGS: Right Kidney: Renal measurements: 9.8 x 5.2 x 4.9 cm = volume: 129 mL. Echogenicity within normal limits. Multiple anechoic lesions with increased through transmission, largest of which is in the upper pole there is a well-defined 2.1 x 1.8 x 1.8 cm lesion, compatible with a simple cyst. No hydronephrosis visualized. Left Kidney: Renal measurements: 10.1 x 5.2 x 4.2 cm = volume: 116 mL. Echogenicity within normal limits. In the lower pole there is a well-defined 2.1 x 1.6 x 1.7 cm anechoic lesion with increased through transmission, compatible with a simple cyst. No hydronephrosis visualized. Bladder: Appears normal for degree of bladder distention. Other: None. IMPRESSION: 1. No acute findings to account for the patient's presentation. Specifically, no evidence of hydronephrosis. 2. Small simple cysts in the kidneys bilaterally, as above. Electronically Signed   By: Vinnie Langton M.D.   On: 07/20/2020 19:52     Labs:   Basic Metabolic Panel: Recent Labs  Lab 07/20/20 1530 07/20/20 1614 07/21/20 0418 07/22/20 0050 07/23/20 0103 07/24/20 0033  NA 141  --  141 137 136 136  K 5.0  --  4.8 4.3 4.2 4.1  CL 112*  --  108 102 101 101  CO2 20*  --  24 27 27 26   GLUCOSE 136*  --  94 133* 141* 142*  BUN 97*  --  96* 90* 91* 94*  CREATININE 3.00*  --   2.80* 2.61* 2.95* 2.76*  CALCIUM 8.7*  --  9.1 9.1 8.9 8.6*  MG  --  1.3*  --  1.5* 1.7  --    GFR Estimated Creatinine Clearance: 18.7 mL/min (A) (by C-G formula based on SCr of 2.76 mg/dL (H)). Liver Function Tests: Recent Labs  Lab 07/20/20 1530  AST 22  ALT 35  ALKPHOS 58  BILITOT 0.6  PROT 6.1*  ALBUMIN 3.0*   No results for input(s): LIPASE, AMYLASE in the last 168 hours. No results for input(s): AMMONIA in the last 168 hours. Coagulation profile No results for input(s): INR, PROTIME in the last 168 hours.  CBC: Recent Labs  Lab 07/20/20 1530 07/22/20 0050 07/23/20 0103  WBC 10.8* 10.0 10.1  NEUTROABS 8.1*  --   --   HGB 8.2* 8.4* 8.3*  HCT 27.2* 26.8* 25.9*  MCV 94.1 90.5 88.7  PLT 213 218 223   Cardiac Enzymes: No results for input(s): CKTOTAL, CKMB, CKMBINDEX, TROPONINI in the last 168 hours. BNP: Invalid input(s): POCBNP CBG: No results for input(s): GLUCAP in the last 168 hours. D-Dimer No  results for input(s): DDIMER in the last 72 hours. Hgb A1c No results for input(s): HGBA1C in the last 72 hours. Lipid Profile No results for input(s): CHOL, HDL, LDLCALC, TRIG, CHOLHDL, LDLDIRECT in the last 72 hours. Thyroid function studies No results for input(s): TSH, T4TOTAL, T3FREE, THYROIDAB in the last 72 hours.  Invalid input(s): FREET3 Anemia work up No results for input(s): VITAMINB12, FOLATE, FERRITIN, TIBC, IRON, RETICCTPCT in the last 72 hours. Microbiology Recent Results (from the past 240 hour(s))  Resp Panel by RT-PCR (Flu A&B, Covid) Nasopharyngeal Swab     Status: None   Collection Time: 07/20/20  5:16 PM   Specimen: Nasopharyngeal Swab; Nasopharyngeal(NP) swabs in vial transport medium  Result Value Ref Range Status   SARS Coronavirus 2 by RT PCR NEGATIVE NEGATIVE Final    Comment: (NOTE) SARS-CoV-2 target nucleic acids are NOT DETECTED.  The SARS-CoV-2 RNA is generally detectable in upper respiratory specimens during the acute phase of  infection. The lowest concentration of SARS-CoV-2 viral copies this assay can detect is 138 copies/mL. A negative result does not preclude SARS-Cov-2 infection and should not be used as the sole basis for treatment or other patient management decisions. A negative result may occur with  improper specimen collection/handling, submission of specimen other than nasopharyngeal swab, presence of viral mutation(s) within the areas targeted by this assay, and inadequate number of viral copies(<138 copies/mL). A negative result must be combined with clinical observations, patient history, and epidemiological information. The expected result is Negative.  Fact Sheet for Patients:  EntrepreneurPulse.com.au  Fact Sheet for Healthcare Providers:  IncredibleEmployment.be  This test is no t yet approved or cleared by the [place] FDA and  has been authorized for detection and/or diagnosis of SARS-CoV-2 by FDA under an Emergency Use Authorization (EUA). This EUA will remain  in effect (meaning this test can be used) for the duration of the COVID-19 declaration under Section 564(b)(1) of the Act, 21 U.S.C.section 360bbb-3(b)(1), unless the authorization is terminated  or revoked sooner.       Influenza A by PCR NEGATIVE NEGATIVE Final   Influenza B by PCR NEGATIVE NEGATIVE Final    Comment: (NOTE) The Xpert Xpress SARS-CoV-2/FLU/RSV plus assay is intended as an aid in the diagnosis of influenza from Nasopharyngeal swab specimens and should not be used as a sole basis for treatment. Nasal washings and aspirates are unacceptable for Xpert Xpress SARS-CoV-2/FLU/RSV testing.  Fact Sheet for Patients: EntrepreneurPulse.com.au  Fact Sheet for Healthcare Providers: IncredibleEmployment.be  This test is not yet approved or cleared by the [place] FDA and has been authorized for detection and/or diagnosis of SARS-CoV-2  by FDA under an Emergency Use Authorization (EUA). This EUA will remain in effect (meaning this test can be used) for the duration of the COVID-19 declaration under Section 564(b)(1) of the Act, 21 U.S.C. section 360bbb-3(b)(1), unless the authorization is terminated or revoked.  Performed at [facility], [place] [address] [place], [place] [address]   MRSA PCR Screening     Status: None   Collection Time: 07/21/20  1:41 AM   Specimen: Nasal Mucosa; Nasopharyngeal  Result Value Ref Range Status   MRSA by PCR NEGATIVE NEGATIVE Final    Comment:        The GeneXpert MRSA Assay (FDA approved for NASAL specimens only), is one component of a comprehensive MRSA colonization surveillance program. It is not intended to diagnose MRSA infection nor to guide or monitor treatment for MRSA infections. Performed at [facility]  [facility], [place] [address] [place], [place] [address]      Discharge Instructions:   Discharge Instructions    (HEART FAILURE PATIENTS) Call MD:  Anytime you have any of the following symptoms: 1) 3 pound weight gain in 24 hours or 5 pounds in 1 week 2) shortness of breath, with or without a dry hacking cough 3) swelling in the hands, feet or stomach 4) if you have to sleep on extra pillows at night in order to breathe.   Complete by: As directed    Diet - low sodium heart healthy   Complete by: As directed    Heart Failure patients record your daily weight using the same scale at the same time of day   Complete by: As directed    Increase activity slowly   Complete by: As directed      Allergies as of 07/24/2020      Reactions   Percocet [oxycodone-acetaminophen] Other (See Comments)   headaches   Diazepam Other (See Comments)   REACTION: Agitation Other reaction(s): agitation   Haloperidol Lactate Nausea And Vomiting   Lorazepam Nausea Only   Other reaction(s): nausea   Morphine Sulfate Other (See Comments)   REACTION: Agitation   Propoxyphene Hcl  Nausea And Vomiting   Tramadol Hcl Nausea And Vomiting   Anesthetics, Halogenated    Per patient's daughter, she can not tolerate anesthetics.    Etomidate    Heart stopped during anesthesia   Fentanyl    Heart stopped during anesthesia   Haloperidol    Other reaction(s): nausea & vomitin   Morphine Sulfate    Other reaction(s): nausea   Oxycodone-acetaminophen    Other reaction(s): headaches   Propoxyphene    Other reaction(s): nausea & vomiting   Tramadol Hcl    Other reaction(s): nausea & vomitin Other reaction(s): nausea & vomitin   Versed [midazolam]    Heart stopped during anesthesia      Medication List    TAKE these medications   allopurinol 100 MG tablet Commonly known as: ZYLOPRIM Take 0.5 tablets (50 mg total) by mouth every other day.   amLODipine 10 MG tablet Commonly known as: NORVASC Take 1 tablet (10 mg total) by mouth daily.   apixaban 5 MG Tabs tablet Commonly known as: ELIQUIS Take 1 tablet (5 mg total) by mouth 2 (two) times daily. Start after you finish the starter pack   atorvastatin 40 MG tablet Commonly known as: LIPITOR Take 40 mg by mouth daily.   B-12 COMPLIANCE INJECTION IJ Inject 1 vial as directed every 30 (thirty) days.   budesonide 3 MG 24 hr capsule Commonly known as: ENTOCORT EC Take 1 capsule by mouth once daily   colestipol 1 g tablet Commonly known as: COLESTID Take 2 g by mouth at bedtime.   ferrous fumarate 325 (106 Fe) MG Tabs tablet Commonly known as: HEMOCYTE - 106 mg FE Take 325 mg of iron by mouth daily.   hydrALAZINE 100 MG tablet Commonly known as: APRESOLINE TAKE 1 TABLET BY MOUTH 3  TIMES DAILY   HYDROmorphone 2 MG tablet Commonly known as: Dilaudid Take 1 tablet (2 mg total) by mouth every 6 (six) hours as needed for severe pain.   isosorbide mononitrate 30 MG 24 hr tablet Commonly known as: IMDUR TAKE 1/2 (ONE-HALF) TABLET BY MOUTH ONCE DAILY (PLEASE  SCHEDULE  AN  APPOINTMENT  FOR  FURTHER   REFILLS) What changed: See the new instructions.   linagliptin 5 MG  Tabs tablet Commonly known as: TRADJENTA Take 5 mg by mouth daily.   melatonin 5 MG Tabs Take 10 mg by mouth at bedtime.   nebivolol 10 MG tablet Commonly known as: BYSTOLIC Take 1 tablet (10 mg total) by mouth daily.   nitroGLYCERIN 0.4 MG SL tablet Commonly known as: NITROSTAT DISSOLVE 1 TABLET UNDER THE TONGUE EVERY 5 MINUTES AS  NEEDED FOR CHEST PAIN. MAX  OF 3 TABLETS IN 15 MINUTES. CALL 911 IF PAIN PERSISTS. What changed: See the new instructions.   pantoprazole 40 MG tablet Commonly known as: PROTONIX Take 1 tablet (40 mg total) by mouth daily before breakfast.   pregabalin 25 MG capsule Commonly known as: Lyrica Take 1 capsule (25 mg total) by mouth 2 (two) times daily.   RETACRIT IJ Inject 1 vial as directed every 30 (thirty) days.   torsemide 20 MG tablet Commonly known as: DEMADEX Take 2 tablets (40 mg total) by mouth daily.       Follow-up Information    Seward Carol, MD Follow up in 1 week(s).   Specialty: Internal Medicine Contact information: [address] [place] [address] [phone]        Josue Hector, MD .   Specialty: Cardiology Contact information: [phone] [address] [place] [address] [phone]        Madelon Lips, MD Follow up.   Specialty: Nephrology Contact information: [place] [address] [phone]        Care, [facility] Follow up.   Specialty: Home Health Services Why: for home health services. They will call you in 1-2 days to set up your first home appointment Contact information: [place] [address] [phone]                Time coordinating discharge: 35 min  Signed:  Geradine Girt DO  [facility] 07/24/2020, 4:42 PM

## 2020-07-24 NOTE — Progress Notes (Signed)
Removed PIV access x 1 and patient's daughter & patient received discharge instructions. [name] took her all belongings. HS [employer]

## 2020-07-24 NOTE — Discharge Instructions (Signed)
dietary sodium restriction, fluid restriction, and close follow-up with Dr. Hollie Salk

## 2020-07-24 NOTE — TOC Transition Note (Signed)
Transition of Care [facility]) - CM/SW Discharge Note   Patient Details  Name: Holley [name] [place] MRN: [id number] Date of Birth: 04-27-1945  Transition of [facility] CM/SW Contact:  [name], RN Phone Number: 07/24/2020, 11:55 AM   Clinical Narrative:    Spoke to patient, she is agreeable for [facility] services. Discussed providers and she has no preference. Referral made to [facility] and accepted. No DME needs.  No other TOC needs identified    Final next level of care: [place] Barriers to Discharge: No Barriers Identified   Patient Goals and CMS Choice Patient states their goals for this hospitalization and ongoing recovery are:: to go home CMS Medicare.gov Compare Post Acute Care list provided to:: Patient Choice offered to / list presented to : Patient  Discharge Placement                       Discharge Plan and Services   Discharge Planning Services: CM Consult            DME Arranged: N/A         HH Arranged: PT,OT [place] Agency: [place] Date [place]: 07/24/20 Time [place]: [id number] Representative spoke with at [place]: [place] ([place]) Interventions     Readmission Risk Interventions No flowsheet data found.

## 2020-07-24 NOTE — TOC Initial Note (Signed)
Transition of Care [facility]) - Initial/Assessment Note    Patient Details  Name: [name] MRN: [id number] Date of Birth: [birth year]  Transition [facility]) CM/SW Contact:    [name], RN Phone Number: 07/24/2020, 9:37 AM  Clinical Narrative:                 Patient from home alone. Admitted with SOB and swelling, Acute on chronic diastolic CHF decompensation Anticipate HH PT OT needs, no ambulatory DME needs  PCP [facility] Cardiologist [name]   Expected Discharge Plan: Home/Self Care Barriers to Discharge: Continued Medical Work up   Patient Goals and CMS Choice        Expected Discharge Plan and Services Expected Discharge Plan: Home/Self Care   Discharge Planning Services: CM Consult   Living arrangements for the past 2 months: Single Family Home                                      Prior Living Arrangements/Services Living arrangements for the past 2 months: Single Family Home Lives with:: Self              Current home services: DME (walker and cane)    Activities of Daily Living Home Assistive Devices/Equipment: Walker (specify type),Cane (specify quad or straight) ADL Screening (condition at time of admission) Patient's cognitive ability adequate to safely complete daily activities?: Yes Is the patient deaf or have difficulty hearing?: No Does the patient have difficulty seeing, even when wearing glasses/contacts?: No Does the patient have difficulty concentrating, remembering, or making decisions?: No Patient able to express need for assistance with ADLs?: Yes Does the patient have difficulty dressing or bathing?: No Independently performs ADLs?: Yes (appropriate for developmental age) Does the patient have difficulty walking or climbing stairs?: No Weakness of Legs: Right Weakness of Arms/Hands: None  Permission Sought/Granted                  Emotional Assessment              Admission diagnosis:   Hypomagnesemia [E83.42] CHF (congestive heart failure) ([place]) [I50.9] Anemia associated with chronic renal failure [N18.9, D63.1] AKI (acute kidney injury) ([place]) [N17.9] Acute on chronic congestive heart failure, unspecified heart failure type [facility]) [I50.9] Patient Active Problem List   Diagnosis Date Noted  . CHF (congestive heart failure) ([place]) 07/20/2020  . Dyspnea 06/28/2020  . Chronic diarrhea - responds to budesonide 09/10/2019  . Iron deficiency anemia 08/19/2018  . Spondylosis of lumbar spine 12/24/2017  . Renal vein thrombosis ([place]) 09/17/2017  . Hoarse voice quality 09/09/2017  . Low back pain without sciatica 09/09/2017  . AKI (acute kidney injury) ([place]) 08/25/2017  . Labile blood glucose   . Sleep disturbance   . Leukocytosis   . Essential hypertension   . Urinary frequency   . Type 2 diabetes mellitus with peripheral neuropathy (HCC)   . Benign essential HTN   . Stage 2 chronic kidney disease   . Dysphagia   . Debility   . Encephalopathy   . Pressure injury of skin 08/04/2017  . Pneumonia of left lung due to infectious organism   . Diffuse pulmonary alveolar hemorrhage   . SOB (shortness of breath) 10/11/2016  . Hyperparathyroidism, secondary renal ([place]) 05/02/2016  . Gout 04/11/2015  . Diabetic neuropathy associated with type 2 diabetes mellitus ([place]) 01/06/2015  . SBO (small bowel obstruction) ([place])  09/15/2013  . Breast cancer of lower-inner quadrant of right female breast ([place]) 03/18/2013  . Chronic venous insufficiency 01/06/2013  . Health care maintenance 05/08/2011  . Diabetes mellitus type 2 in obese ([place]) 05/03/2010  . Seasonal allergies 05/03/2010  . Labile hypertension   . CAD (coronary artery disease)   . Abdominal discomfort   . Anemia in chronic kidney disease   . History of stroke without residual deficits   . Hyperlipidemia associated with type 2 diabetes mellitus ([place])   . Chronic pain   . Chronic kidney disease, stage III (moderate) (HCC)    . Adnexal mass   . Spinal stenosis, lumbar region, with neurogenic claudication 01/03/2009   PCP:  Seward Carol, MD Pharmacy:   [facility] [address] [place]), [place] [address] [place]) [place] [address] Phone: [phone] Fax: [phone]     Social Determinants of Health (SDOH) Interventions    Readmission Risk Interventions No flowsheet data found.

## 2020-07-25 NOTE — Progress Notes (Signed)
[place]   [phone] [phone]   Clinic Follow up Note   Patient Care Team: Seward Carol, MD as PCP - General (Internal Medicine) Josue Hector, MD as PCP - Cardiology (Cardiology) Thelma Comp, [place] as Consulting Physician (Optometry) Stark Klein, MD as Consulting Physician (General Surgery) Truitt Merle, MD as Consulting Physician (Hematology) Madelon Lips, MD as Consulting Physician (Nephrology)  Date of Service:  07/29/2020  CHIEF COMPLAINT: Followup of rightbreast cancer and anemia of chronic disease  SUMMARY OF ONCOLOGIC HISTORY: Oncology History Overview Note  Cancer Staging Breast cancer of lower-inner quadrant of right female breast [facility]) Staging form: Breast, AJCC 7th Edition - Clinical: Stage IA (T1b, N0, cM0) - Unsigned Staging comments: Staged at breast conference 03/25/13.  - Pathologic stage from 05/12/2013: Stage IA (T1b, N0, cM0) - Signed by Truitt Merle, MD on 06/21/2016     Breast cancer of lower-inner quadrant of right female breast ([place])  02/27/2013 Mammogram   Diagnostic mammogram and ultrasound showed a 6 x 5 mm mass at 5:00 position. Axilla was negative by ultrasound.   03/16/2013 Initial Biopsy   Right breast 5:00 position biopsy showed invasive ductal carcinoma, low-grade   03/16/2013 Receptors her2   ER100% positive, PR 100% positive, HER-2 negative, Ki-67 20%   03/18/2013 Initial Diagnosis   Breast cancer of lower-inner quadrant of right female breast ([place])   05/12/2013 Surgery   Right breast lumpectomy with sentinel lymph node biopsy   05/12/2013 Pathology Results   Right breast lumpectomy showed invasive ductal carcinoma, grade 2, 0.9 cm, 6 sentinel lymph nodes were all negative. Margins were negative.   05/12/2013 Oncotype testing   Oncotype recurrence score 19, intermediate risk, 10 year risk of distant recurrence 12% with tamoxifen for 5 years. Patient declined adjuvant chemotherapy.    06/02/2013 - 07/16/2013 Radiation Therapy   Adjuvant breast radiation with boost 1600Gy   08/06/2013 - 07/2017 Anti-estrogen oral therapy   Arimidex 1 mg daily. Stopped in 07/2017 due to acute illness    03/22/2017 Mammogram   IMPRESSION: No mammographic evidence for malignancy.   Iron deficiency anemia     CURRENT THERAPY:  -Retacritinjection starting 09/2018, currently q2weeks. Increased to 20K unitsq2weeksstarting 06/29/19. 40Kunit every 2 weekson 08/13/19. Reduced to every 4 weeks from 02/01/20. Increased to every 2 weeks from 07/29/20.  -IV Feraheme as needed starting 09/15/18. Last on 08/10/19 -Oral Ferrous sulfate daily -B12 injection monthly starting 05/04/19. Increased to every 2 weeks on 06/29/19.Reduced to every 4 weeks from 02/01/20.   INTERVAL HISTORY:  [name] is here for a follow up. She was last seen by me 6 months ago. She presents to the clinic alone. She was recently admitted to hospital for CHF twice. She is doing much better now. She note she is more fatigued lately.     REVIEW OF SYSTEMS:   Constitutional: Denies fevers, chills or abnormal weight loss Eyes: Denies blurriness of vision Ears, nose, mouth, throat, and face: Denies mucositis or sore throat Respiratory: Denies cough, dyspnea or wheezes Cardiovascular: Denies palpitation, chest discomfort or lower extremity swelling Gastrointestinal:  Denies nausea, heartburn or change in bowel habits Skin: Denies abnormal skin rashes Lymphatics: Denies new lymphadenopathy or easy bruising Neurological:Denies numbness, tingling or new weaknesses Behavioral/Psych: Mood is stable, no new changes  All other systems were reviewed with the patient and are negative.  MEDICAL HISTORY:  Past Medical History:  Diagnosis Date  . Abdominal discomfort    Chronic N/V/D. Presumptive dx Crohn's dx  per elevated p ANCA. Failed Entocort and Pentasa. Sep 2003 - ileocolectomy c anastomosis per Dr Deon Pilling 2/2 adhesions - path  was hegative for Crohns. EGD, Sm bowel follow through (11/03), and an eteroclysis (10/03) were unrevealing. Cuases hypomag and hypocalcemia.  . Adnexal mass 8/03   s/p lap BSO (R ovarian fibroma) & lysis of adhesions  . Allergy    Seasonal  . Anemia    Multifactorial. Baseline HgB 10-11 ish. B12 def - 150 in 3/10. Fe Def - ferritin 35 3/10. Both are being repleted.  . Breast cancer ([place]) 03/16/13   right, 5 o'clock  . CAD (coronary artery disease) 1996   1996 - PTCA and angioplasty diagonal branch. 2000 - Rotoblator & angiopllasty of diagonal. 2006 - subendocardial AMI, DES to proximal LAD.Marland Kitchen Also had 90% stenosis in distal apical LAD. EF 55 with apical hypokinesis. Indefinite ASA and Plavix.  . CHF (congestive heart failure) ([place])   . Chronic kidney disease    Chronic renal insuff baseline Cr 1.2 - 1.4 ish.  . Chronic pain    CT 10/10 = Spinal stenosis L2 - S1.  . Diabetes mellitus    Insulin dependent  . Gout   . Hx of radiation therapy 06/02/13- 07/16/13   right rbeast 4500 cGy 25 sessions, right breast boost 1600 cGy in 8 sessions  . Hyperlipidemia    Managed with both a statin and Welchol. Welchol stopped 2014 2/2 cost and started on fenofibrate   . Hypertension    2006 B renal arteries patent. 2003 MRA - no RAS. 2003 pheo W/U Dr Hassell Done reportedly negative.  . Hypoxia 07/23/2017  . Lupus ([place])   . Lymphedema of breast   . Personal history of radiation therapy 2015  . RBBB   . Renal vein thrombosis ([place])   . SBO (small bowel obstruction) ([place]) 09/17/2017  . Secondary hyperparathyroidism ([place])   . Stroke [facility])    Incidental finding MRI 2002 L lacunar infarct  . Vitamin B12 deficiency   . Vitamin D deficiency   . Wears dentures    top    SURGICAL HISTORY: Past Surgical History:  Procedure Laterality Date  . ABDOMINAL HYSTERECTOMY    . BILATERAL SALPINGOOPHORECTOMY  8/03   Lap BSO (R ovarian fibroma) and adhesion lysis  . BOWEL RESECTION  2003   ileocolectomy with  anastomosis 2/2 adhesions  . BREAST BIOPSY Right 2015  . BREAST BIOPSY Right 07/2014  . BREAST LUMPECTOMY Right 04/22/2013  . BREAST LUMPECTOMY WITH NEEDLE LOCALIZATION AND AXILLARY SENTINEL LYMPH NODE BX Right 04/22/2013   Procedure: BREAST LUMPECTOMY WITH NEEDLE LOCALIZATION AND AXILLARY SENTINEL LYMPH NODE BX;  Surgeon: Stark Klein, MD;  Location: [place];  Service: General;  Laterality: Right;  . CARDIAC CATHETERIZATION     2 stents  . CHOLECYSTECTOMY    . COLONOSCOPY    . HEMICOLECTOMY     R sided hemicolectomy  . HERNIA REPAIR     Ventral hernia repair  . PTCA  4/06    I have reviewed the social history and family history with the patient and they are unchanged from previous note.  ALLERGIES:  is allergic to percocet [oxycodone-acetaminophen]; diazepam; haloperidol lactate; lorazepam; morphine sulfate; propoxyphene hcl; tramadol hcl; anesthetics, halogenated; etomidate; fentanyl; haloperidol; morphine sulfate; oxycodone-acetaminophen; propoxyphene; tramadol hcl; and versed [midazolam].  MEDICATIONS:  Current Outpatient Medications  Medication Sig Dispense Refill  . allopurinol (ZYLOPRIM) 100 MG tablet Take 0.5 tablets (50 mg total) by mouth every other day. 45 tablet  0  . amLODipine (NORVASC) 10 MG tablet Take 1 tablet (10 mg total) by mouth daily. 30 tablet 0  . apixaban (ELIQUIS) 5 MG TABS tablet Take 1 tablet (5 mg total) by mouth 2 (two) times daily. Start after you finish the starter pack 60 tablet 2  . atorvastatin (LIPITOR) 40 MG tablet Take 40 mg by mouth daily.    . budesonide (ENTOCORT EC) 3 MG 24 hr capsule Take 1 capsule by mouth once daily (Patient taking differently: Take 3 mg by mouth daily.) 30 capsule 3  . colestipol (COLESTID) 1 g tablet Take 2 g by mouth at bedtime.    . Cyanocobalamin (B-12 COMPLIANCE INJECTION IJ) Inject 1 vial as directed every 30 (thirty) days.    Marland Kitchen Epoetin Alfa-epbx (RETACRIT IJ) Inject 1 vial as directed every 30 (thirty)  days.    . ferrous fumarate (HEMOCYTE - 106 MG FE) 325 (106 Fe) MG TABS tablet Take 325 mg of iron by mouth daily.    . hydrALAZINE (APRESOLINE) 100 MG tablet TAKE 1 TABLET BY MOUTH 3  TIMES DAILY (Patient taking differently: Take 100 mg by mouth 3 (three) times daily.) 270 tablet 2  . HYDROmorphone (DILAUDID) 2 MG tablet Take 1 tablet (2 mg total) by mouth every 6 (six) hours as needed for severe pain. 10 tablet 0  . isosorbide mononitrate (IMDUR) 30 MG 24 hr tablet TAKE 1/2 (ONE-HALF) TABLET BY MOUTH ONCE DAILY (PLEASE  SCHEDULE  AN  APPOINTMENT  FOR  FURTHER  REFILLS) (Patient taking differently: Take 15 mg by mouth daily.) 45 tablet 0  . linagliptin (TRADJENTA) 5 MG TABS tablet Take 5 mg by mouth daily.    . Melatonin 5 MG TABS Take 10 mg by mouth at bedtime.    . nebivolol (BYSTOLIC) 10 MG tablet Take 1 tablet (10 mg total) by mouth daily. 90 tablet 0  . nitroGLYCERIN (NITROSTAT) 0.4 MG SL tablet DISSOLVE 1 TABLET UNDER THE TONGUE EVERY 5 MINUTES AS  NEEDED FOR CHEST PAIN. MAX  OF 3 TABLETS IN 15 MINUTES. CALL 911 IF PAIN PERSISTS. (Patient taking differently: Place 0.4 mg under the tongue every 5 (five) minutes as needed for chest pain.) 100 tablet 3  . pantoprazole (PROTONIX) 40 MG tablet Take 1 tablet (40 mg total) by mouth daily before breakfast. 90 tablet 3  . pregabalin (LYRICA) 25 MG capsule Take 1 capsule (25 mg total) by mouth 2 (two) times daily. 60 capsule 1  . torsemide (DEMADEX) 20 MG tablet Take 2 tablets (40 mg total) by mouth daily. 180 tablet 3   No current facility-administered medications for this visit.    PHYSICAL EXAMINATION: ECOG PERFORMANCE STATUS: 1 - Symptomatic but completely ambulatory  Vitals:   07/29/20 1408  BP: (!) 133/50  Pulse: 79  Resp: 18  Temp: (!) 97.5 F (36.4 C)  SpO2: 98%   Filed Weights   07/29/20 1408  Weight: 177 lb 9.6 oz (80.6 kg)    Due to COVID19 we will limit examination to appearance. Patient had no complaints.  GENERAL:alert,  no distress and comfortable SKIN: skin color normal, no rashes or significant lesions EYES: normal, Conjunctiva are pink and non-injected, sclera clear  NEURO: alert & oriented x 3 with fluent speech   LABORATORY DATA:  I have reviewed the data as listed CBC Latest Ref Rng & Units 07/29/2020 07/23/2020 07/22/2020  WBC 4.0 - 10.5 K/uL 10.1 10.1 10.0  Hemoglobin 12.0 - 15.0 g/dL 8.6(L) 8.3(L) 8.4(L)  Hematocrit 36.0 -  46.0 % 27.7(L) 25.9(L) 26.8(L)  Platelets 150 - 400 K/uL 169 223 218     CMP Latest Ref Rng & Units 07/24/2020 07/23/2020 07/22/2020  Glucose 70 - 99 mg/dL 142(H) 141(H) 133(H)  BUN 8 - 23 mg/dL 94(H) 91(H) 90(H)  Creatinine 0.44 - 1.00 mg/dL 2.76(H) 2.95(H) 2.61(H)  Sodium 135 - 145 mmol/L 136 136 137  Potassium 3.5 - 5.1 mmol/L 4.1 4.2 4.3  Chloride 98 - 111 mmol/L 101 101 102  CO2 22 - 32 mmol/L _0 Calcium 8.9 - 10.3 mg/dL 8.6(L) 8.9 9.1  Total Protein 6.5 - 8.1 g/dL - - -  Total Bilirubin 0.3 - 1.2 mg/dL - - -  Alkaline Phos 38 - 126 U/L - - -  AST 15 - 41 U/L - - -  ALT 0 - 44 U/L - - -      RADIOGRAPHIC STUDIES: I have personally reviewed the radiological images as listed and agreed with the findings in the report. MM CLIP PLACEMENT RIGHT  Result Date: 07/28/2020 CLINICAL DATA:  Status post 2 ultrasound-guided biopsies of the RIGHT breast. EXAM: DIAGNOSTIC RIGHT MAMMOGRAM POST ULTRASOUND BIOPSY COMPARISON:  Previous exam(s). FINDINGS: Mammographic images were obtained following ultrasound guided biopsy of 2 suspicious findings within the outer RIGHT breast. The biopsy marking clips are in expected positions at the sites of biopsy. IMPRESSION: 1. Appropriate positioning of the heart shaped biopsy marking clip at the site of biopsy in the upper-outer quadrant of the RIGHT breast corresponding to the targeted hypoechoic area at the 10 o'clock axis. 2. Appropriate positioning of the ribbon shaped clip within the outer RIGHT breast corresponding to the targeted skin  mass at the 10 o'clock axis. 3. Small-to-moderate sized hematoma is present at the 9 o'clock biopsy site. Hemostasis achieved and bandages applied. Final Assessment: Post Procedure Mammograms for Marker Placement Electronically Signed   By: Franki Cabot M.D.   On: 07/28/2020 13:45   [place] RT BREAST BX W LOC DEV 1ST LESION IMG BX SPEC US GUIDE  Result Date: 07/28/2020 CLINICAL DATA:  Patient with suspicious masses in the RIGHT breast at the 9 o'clock axis (at and just below the skin) and at the 10 o'clock axis presents today for ultrasound-guided biopsies at each site. Patient has a history of RIGHT breast cancer in 2014 status post lumpectomy and radiation therapy, with new skin discoloration and possible early peau d'orange of the outer RIGHT breast. EXAM: ULTRASOUND GUIDED RIGHT BREAST CORE NEEDLE BIOPSY x2 COMPARISON:  Previous exam(s). PROCEDURE: I met with the patient and we discussed the procedure of ultrasound-guided biopsy, including benefits and alternatives. We discussed the high likelihood of a successful procedure. We discussed the risks of the procedure, including infection, bleeding, tissue injury, clip migration, and inadequate sampling. Informed written consent was given. The usual time-out protocol was performed immediately prior to the procedure. Site 1: Lesion quadrant: Upper-outer quadrant Using sterile technique and 1% Lidocaine as local anesthetic, under direct ultrasound visualization, a 12 gauge spring-loaded device was used to perform biopsy of the irregular hypoechoic area in the RIGHT breast at the 10 o'clock axis, 8 cm from the nipple, using a inferolateral approach. At the conclusion of the procedure heart shaped tissue marker clip was deployed into the biopsy cavity. Site 2: Lesion quadrant: 9 o'clock axis Using sterile technique and 1% Lidocaine as local anesthetic, under direct ultrasound visualization, a 12 gauge spring-loaded device was used to perform biopsy of the superficial  mass within and just below the  skin at the 9 o'clock axis using a inferior approach. At the conclusion of the procedure ribbon shaped tissue marker clip was deployed into the biopsy cavity. Follow up 2 view mammogram was performed and dictated separately. IMPRESSION: 1. Ultrasound guided biopsy of the hypoechoic area in the RIGHT breast at the 10 o'clock axis. No apparent complications. 2. Ultrasound guided biopsy of the superficial vascular mass within the skin of the RIGHT breast at the 9 o'clock axis. No apparent complications. Electronically Signed   By: Franki Cabot M.D.   On: 07/28/2020 13:37   [place] RT BREAST BX W LOC DEV EA ADD LESION IMG BX SPEC US GUIDE  Result Date: 07/28/2020 CLINICAL DATA:  Patient with suspicious masses in the RIGHT breast at the 9 o'clock axis (at and just below the skin) and at the 10 o'clock axis presents today for ultrasound-guided biopsies at each site. Patient has a history of RIGHT breast cancer in 2014 status post lumpectomy and radiation therapy, with new skin discoloration and possible early peau d'orange of the outer RIGHT breast. EXAM: ULTRASOUND GUIDED RIGHT BREAST CORE NEEDLE BIOPSY x2 COMPARISON:  Previous exam(s). PROCEDURE: I met with the patient and we discussed the procedure of ultrasound-guided biopsy, including benefits and alternatives. We discussed the high likelihood of a successful procedure. We discussed the risks of the procedure, including infection, bleeding, tissue injury, clip migration, and inadequate sampling. Informed written consent was given. The usual time-out protocol was performed immediately prior to the procedure. Site 1: Lesion quadrant: Upper-outer quadrant Using sterile technique and 1% Lidocaine as local anesthetic, under direct ultrasound visualization, a 12 gauge spring-loaded device was used to perform biopsy of the irregular hypoechoic area in the RIGHT breast at the 10 o'clock axis, 8 cm from the nipple, using a inferolateral  approach. At the conclusion of the procedure heart shaped tissue marker clip was deployed into the biopsy cavity. Site 2: Lesion quadrant: 9 o'clock axis Using sterile technique and 1% Lidocaine as local anesthetic, under direct ultrasound visualization, a 12 gauge spring-loaded device was used to perform biopsy of the superficial mass within and just below the skin at the 9 o'clock axis using a inferior approach. At the conclusion of the procedure ribbon shaped tissue marker clip was deployed into the biopsy cavity. Follow up 2 view mammogram was performed and dictated separately. IMPRESSION: 1. Ultrasound guided biopsy of the hypoechoic area in the RIGHT breast at the 10 o'clock axis. No apparent complications. 2. Ultrasound guided biopsy of the superficial vascular mass within the skin of the RIGHT breast at the 9 o'clock axis. No apparent complications. Electronically Signed   By: Franki Cabot M.D.   On: 07/28/2020 13:37     ASSESSMENT & PLAN:  Zaylin [name] Mccravy is a 75 y.o. female with   1. Anemia of chronic disease (CKDstage IV), Ironand B12deficiencyand GI bleeding -Her 04/2018 Colonoscopy with Dr Carlean Purl showed no evidence of GI bleedingor source of bleeding.Heranemia is probably related to her CKD, however her anemia is out of proportion of her stage IV CKD related anemia. -Her6/23/20 bone marrow biopsyresults showed hypercellular marrow, no evidence of MDS or other concerns.Her Cytogenetics is still pending. -She has been receiving IV iron as needed, for ferritin <100.  -Started receiving monthly B12 injections in our clinic on 05/04/19.Was increased to q2weeks on 06/29/19. -Herinsurancedid not approve Aranesp so shewas switched toRetacrit every 2 weekson 11/14/18. Goal is Hg9-11.Due to worsened anemia, her dose has been increased, currently at40,000 every 4 weeks. -She  continues to have blood transfusion as needed, last given 08/20/19. -Continue OTC iron pill ferrous  sulfate 1-2 tab dailyfor irondeficiency -She is clinically doing well except more fatigued. Labs reviewed, Hg 8.6. I discussed her Anemia has worsened. I will increase Retacrit to every 2 weeks. Proceed with Retacrit today along with B12 -Continue Retacrit to every 2 weeks with lab (goal Hg 9-11). Will giveIV iron if ferritin less than 100. -continue B12 injection every 4 weeks  -F/u in 2 months.   2.Breast cancer of the lower inner quadrant of the right breast, invasive ductal carcinoma with DCIS, pT1bN0Mo, stage IA, ER+ and PR +, HER 2 negative -She was diagnosed in 02/2013 and treated with right lumpectomy, adjuvant radiation andAnastrozolefor 4 years until5/2019.Anastrozole was stopped after she developed septic shock from pneumonia and a cardiac arrest,and we decided not restart due to the risk of cardiac toxicity. -Her 07/11/20 Mammogram showed suspicious right breast mass. She underwent breast biopsy on 07/28/20 which showed fibroadipose and necrosis, no malignancy. However radiologist recommend skin biopsy to rule out malignancy due to her mild edema, will send her to Dr. [name] for skin biopsy, she is agreeable  -She isover6years out since diagnosis. Continue breast cancer surveillance, Next mammogram in 03/2020 -She will proceed with DEXA on 08/08/20. She can repeat in 3 years.   3. DM, HTN, CAD -Continue medications managedby PCPand Dr. Johnsie Cancel the cardiologist. -She was hospitalized for CHF twice in recent weeks (07/2020). She has recovered mostly well.   4.Chronic renal insufficieny, CKD stageIV -She follows up with Dr.Uptonfor herkidney function.   5. Low Backpain, from degenerative arthritis and spinal stenosis -Tolerable and stable.Continue f/u with Orthopedics  6. Genetics results negative  7. Bone Health  -DEXA from 04/2018 shows slightly improved normal bone density with lowest T-score of 0.9 at forearm radius. Plan to repeat in 03/2020.  -Her  VitD level is low. She will continue 1000u daily.  8. Left Renal Vein Thrombosisin 07/2017 -Likely provoked from acute illnessand prior SBO -She stopped Xarelto in 01/2018 -Her hypercoagulopathy work-up in November 2019 was negative  9. History of pneumoniaand cardiac arrestin 07/2017  PLAN: -Proceed with Retacrit and B12 today  -Lab and Retacritevery 2 weeks and B12every 4 weeks, will give IV Iron if Ferritin less than 100. -F/u in53month -I send a message to Dr. BBarry Dienesfor her f/u and skin punch biopsy    No problem-specific Assessment & Plan notes found for this encounter.   No orders of the defined types were placed in this encounter.  All questions were answered. The patient knows to call the clinic with any problems, questions or concerns. No barriers to learning was detected. The total time spent in the appointment was 30 minutes.     YTruitt Merle MD 07/29/2020   I, AJoslyn Devon am acting as scribe for YTruitt Merle MD.   I have reviewed the above documentation for accuracy and completeness, and I agree with the above.

## 2020-07-27 ENCOUNTER — Ambulatory Visit: Payer: Medicare Other | Admitting: Physical Therapy

## 2020-07-27 ENCOUNTER — Other Ambulatory Visit: Payer: Self-pay

## 2020-07-27 DIAGNOSIS — M5441 Lumbago with sciatica, right side: Secondary | ICD-10-CM

## 2020-07-27 DIAGNOSIS — R252 Cramp and spasm: Secondary | ICD-10-CM

## 2020-07-27 DIAGNOSIS — M25511 Pain in right shoulder: Secondary | ICD-10-CM

## 2020-07-27 DIAGNOSIS — M25512 Pain in left shoulder: Secondary | ICD-10-CM | POA: Diagnosis not present

## 2020-07-27 DIAGNOSIS — R41841 Cognitive communication deficit: Secondary | ICD-10-CM | POA: Diagnosis present

## 2020-07-27 DIAGNOSIS — M6281 Muscle weakness (generalized): Secondary | ICD-10-CM

## 2020-07-27 DIAGNOSIS — R262 Difficulty in walking, not elsewhere classified: Secondary | ICD-10-CM

## 2020-07-27 NOTE — Patient Instructions (Signed)
IONTOPHORESIS PATIENT PRECAUTIONS & CONTRAINDICATIONS:  . Redness under one or both electrodes can occur.  This characterized by a uniform redness that usually disappears within 12 hours of treatment. . Small pinhead size blisters may result in response to the drug.  Contact your physician if the problem persists more than 24 hours. . On rare occasions, iontophoresis therapy can result in temporary skin reactions such as rash, inflammation, irritation or burns.  The skin reactions may be the result of individual sensitivity to the ionic solution used, the condition of the skin at the start of treatment, reaction to the materials in the electrodes, allergies or sensitivity to dexamethasone, or a poor connection between the patch and your skin.  Discontinue using iontophoresis if you have any of these reactions and report to your therapist. . Remove the Patch or electrodes if you have any undue sensation of pain or burning during the treatment and report discomfort to your therapist. . Tell your Therapist if you have had known adverse reactions to the application of electrical current. . If using the Patch, the LED light will turn off when treatment is complete and the patch can be removed.  Approximate treatment time is 1-3 hours.  Remove the patch when light goes off or after 6 hours. . The Patch can be worn during normal activity, however excessive motion where the electrodes have been placed can cause poor contact between the skin and the electrode or uneven electrical current resulting in greater risk of skin irritation. . Keep out of the reach of children.   . DO NOT use if you have a cardiac pacemaker or any other electrically sensitive implanted device. . DO NOT use if you have a known sensitivity to dexamethasone. . DO NOT use during Magnetic Resonance Imaging (MRI). . DO NOT use over broken or compromised skin (e.g. sunburn, cuts, or acne) due to the increased risk of skin reaction. . DO  NOT SHAVE over the area to be treated:  To establish good contact between the Patch and the skin, excessive hair may be clipped. . DO NOT place the Patch or electrodes on or over your eyes, directly over your heart, or brain. . DO NOT reuse the Patch or electrodes as this may cause burns to occur.  

## 2020-07-27 NOTE — Therapy (Signed)
[facility] [address], [place], [place], [address] Phone: [phone]   Fax:  [phone]  Physical Therapy Treatment  Patient Details  Name: [name] MRN: [id number] Date of Birth: 06-Jul-1945 Referring Provider (PT): Marchia Bond, MD   Encounter Date: 07/27/2020   PT End of Session - 07/27/20 1746    Visit Number 3    Date for PT Re-Evaluation 09/09/20    Authorization Type [employer] Medicare    Progress Note Due on Visit 10    PT Start Time 1016    PT Stop Time 1057    PT Time Calculation (min) 41 min    Activity Tolerance Patient tolerated treatment well;Patient limited by pain    Behavior During Therapy [facility] for tasks assessed/performed           Past Medical History:  Diagnosis Date  . Abdominal discomfort    Chronic N/V/D. Presumptive dx Crohn's dx per elevated p ANCA. Failed Entocort and Pentasa. Sep 2003 - ileocolectomy c anastomosis per Dr Deon Pilling 2/2 adhesions - path was hegative for Crohns. EGD, Sm bowel follow through (11/03), and an eteroclysis (10/03) were unrevealing. Cuases hypomag and hypocalcemia.  . Adnexal mass 8/03   s/p lap BSO (R ovarian fibroma) & lysis of adhesions  . Allergy    Seasonal  . Anemia    Multifactorial. Baseline HgB 10-11 ish. B12 def - 150 in 3/10. Fe Def - ferritin 35 3/10. Both are being repleted.  . Breast cancer ([place]) 03/16/13   right, 5 o'clock  . CAD (coronary artery disease) 1996   1996 - PTCA and angioplasty diagonal branch. 2000 - Rotoblator & angiopllasty of diagonal. 2006 - subendocardial AMI, DES to proximal LAD.Marland Kitchen Also had 90% stenosis in distal apical LAD. EF 55 with apical hypokinesis. Indefinite ASA and Plavix.  . CHF (congestive heart failure) ([place])   . Chronic kidney disease    Chronic renal insuff baseline Cr 1.2 - 1.4 ish.  . Chronic pain    CT 10/10 = Spinal stenosis L2 - S1.  . Diabetes mellitus    Insulin dependent  . Gout   . Hx of  radiation therapy 06/02/13- 07/16/13   right rbeast 4500 cGy 25 sessions, right breast boost 1600 cGy in 8 sessions  . Hyperlipidemia    Managed with both a statin and Welchol. Welchol stopped 2014 2/2 cost and started on fenofibrate   . Hypertension    2006 B renal arteries patent. 2003 MRA - no RAS. 2003 pheo W/U Dr Hassell Done reportedly negative.  . Hypoxia 07/23/2017  . Lupus ([place])   . Lymphedema of breast   . Personal history of radiation therapy 2015  . RBBB   . Renal vein thrombosis ([place])   . SBO (small bowel obstruction) ([place]) 09/17/2017  . Secondary hyperparathyroidism ([place])   . Stroke [facility])    Incidental finding MRI 2002 L lacunar infarct  . Vitamin B12 deficiency   . Vitamin D deficiency   . Wears dentures    top    Past Surgical History:  Procedure Laterality Date  . ABDOMINAL HYSTERECTOMY    . BILATERAL SALPINGOOPHORECTOMY  8/03   Lap BSO (R ovarian fibroma) and adhesion lysis  . BOWEL RESECTION  2003   ileocolectomy with anastomosis 2/2 adhesions  . BREAST BIOPSY Right 2015  . BREAST BIOPSY Right 07/2014  . BREAST LUMPECTOMY Right 04/22/2013  . BREAST LUMPECTOMY WITH NEEDLE LOCALIZATION AND AXILLARY SENTINEL LYMPH NODE BX Right 04/22/2013  Procedure: BREAST LUMPECTOMY WITH NEEDLE LOCALIZATION AND AXILLARY SENTINEL LYMPH NODE BX;  Surgeon: Stark Klein, MD;  Location: [facility];  Service: General;  Laterality: Right;  . CARDIAC CATHETERIZATION     2 stents  . CHOLECYSTECTOMY    . COLONOSCOPY    . HEMICOLECTOMY     R sided hemicolectomy  . HERNIA REPAIR     Ventral hernia repair  . PTCA  4/06    There were no vitals filed for this visit.   Subjective Assessment - 07/27/20 1741    Subjective Per grand daughter, patient experienced another fall following last session (4/28). Has had significant lt shoulder pain and loss of AROM since that time. Patient also having to be hospitalized due to volume overload since last session. Patient reports that back  and Rt LE feel better.    Patient is accompained by: Family member    Pertinent History history of Rt breast cancer s/p lumpectomy (2015), type 2 diabetes, HTN, Rt LE DVT    Limitations Walking;House hold activities;Standing    How long can you sit comfortably? 20 minutes    How long can you stand comfortably? 15 minutes    How long can you walk comfortably? 5 minutes    Patient Stated Goals ambulation without AD;  decreased pain    Currently in Pain? Yes   Patient unwilling to quantify. Continually states that she is "fine".             [facility] PT Assessment - 07/27/20 0001      AROM   Left Shoulder Flexion 0 Degrees    Left Shoulder ABduction 5 Degrees      Strength   Right Shoulder Flexion 2+/5    Right Shoulder ABduction 2+/5    Right Shoulder Internal Rotation 4-/5    Right Shoulder External Rotation 2/5                         [facility] Adult PT Treatment/Exercise - 07/27/20 0001      Shoulder Exercises: Supine   Protraction Right;AROM    Protraction Weight (lbs) 2 x 10 repetitions; continual tactile cues for decreased scapular elevation    External Rotation AAROM;Both;20 reps    External Rotation Weight (lbs) wand    Flexion AAROM;Both;5 reps    Shoulder Flexion Weight (lbs) wand; unable to complete due to increased Lt shoulder pain    Other Supine Exercises wand chest press; x10 repetitions      Shoulder Exercises: Standing   Extension Both;10 reps    Theraband Level (Shoulder Extension) Level 1 (Yellow)    Row Both;10 reps    Theraband Level (Shoulder Row) Level 2 (Red)      Modalities   Modalities Iontophoresis      Iontophoresis   Type of Iontophoresis Dexamethasone    Location Lt scapula      Manual Therapy   Manual Therapy Passive ROM    Passive ROM Lt shoulder in all directions with gentle over pressure at end range and oscillations to inhibit muscle guarding                    PT Short Term Goals - 07/27/20 1745      PT  SHORT TERM GOAL #2   Title Patient will demonstrate 4+/5 Rt LE strength to more readily complete functional transfers    Baseline grossly 4-/5    Time 4    Period Weeks  Status On-going    Target Date 08/09/20      PT SHORT TERM GOAL #4   Title Patient will demonstrate 120 degrees Rt shoulder flexion and abduction to more readily complete ADLs.    Baseline Flexion 89; Abduction 79    Time 4    Period Weeks    Status On-going   approximately the same as initial evaluation   Target Date 08/12/20             PT Long Term Goals - 07/12/20 1642      PT LONG TERM GOAL #1   Title Patient will be independent with advanced HEP for long term management of symptoms post D/C.    Time 8    Period Weeks    Status New    Target Date 09/06/20      PT LONG TERM GOAL #2   Title Patient will improved FOTO score by 10 points to indicate improved overall function.    Time 8    Period Weeks    Status New    Target Date 09/06/20      PT LONG TERM GOAL #3   Title Patient will perform five times sit to stand in 12s or less with good eccentric control for decreased fall risk    Baseline 18s; poor ecccentric control    Time 8    Period Weeks    Status New    Target Date 09/06/20      PT LONG TERM GOAL #4   Title Patient will lift 10# from floor level using proper mechanics x5 reps without increased pain to more readily complete ADLs.    Time 8    Period Weeks    Status New    Target Date 09/06/20      PT LONG TERM GOAL #5   Title Patient will complete six minute walk test without AD, minimal gait deviations, and without increased pain to indicate readiness for home and community ambulation    Time 8    Period Weeks    Status New    Target Date 09/06/20                 Plan - 07/27/20 1743    Clinical Impression Statement Patient demonstrates significant Lt shoulder AROM and strength impairments. Unable to perform any form of diagnostic testing as patient unabe to attain  any testing position despite physical assistance. Continues to require tactile cuing for decreased scapular elevation of Rt shoulder when performing therex. Would benefit from continued skilled intervention to address impairments for improved activity tolerance and functional use of bil UE.    Personal Factors and Comorbidities Comorbidity 3+;Fitness    Comorbidities history of Rt breast cancer s/p lumpectomy (2015), type 2 diabetes, HTN, Rt LE DVT    Examination-Activity Limitations Bend;Carry;Lift;Locomotion Level;Reach Overhead;Sit;Stand;Transfers    Examination-Participation Restrictions Cleaning;Meal Prep;Laundry;Shop    Rehab Potential Fair    PT Frequency 2x / week    PT Duration 8 weeks    PT Treatment/Interventions ADLs/Self Care Home Management;Aquatic Therapy;Cryotherapy;Electrical Stimulation;Iontophoresis 59m/ml Dexamethasone;Moist Heat;Traction;Gait training;Functional mobility training;Therapeutic activities;Therapeutic exercise;Balance training;Neuromuscular re-education;Patient/family education;Manual techniques;Passive range of motion;Dry needling;Taping;Spinal Manipulations;Joint Manipulations    PT Next Visit Plan continue scapular stabilization for Rt shoulder; manual, PROM, AAROM, isometrics for Lt shoulder; core and glute strength for lumbar    PT Home Exercise Plan Access Code [email or web address]    Consulted and Agree with Plan of Care Patient;Family member/caregiver    Family Member Consulted grand daughter  Patient will benefit from skilled therapeutic intervention in order to improve the following deficits and impairments:  Abnormal gait,Decreased activity tolerance,Decreased balance,Decreased endurance,Decreased range of motion,Decreased safety awareness,Decreased strength,Difficulty walking,Hypomobility,Increased fascial restricitons,Increased muscle spasms,Impaired UE functional use,Improper body mechanics,Postural dysfunction,Pain  Visit Diagnosis: Cramp and  spasm  Muscle weakness (generalized)  Difficulty in walking, not elsewhere classified  Acute right-sided low back pain with right-sided sciatica  Acute pain of right shoulder  Acute pain of left shoulder     Problem List Patient Active Problem List   Diagnosis Date Noted  . CHF (congestive heart failure) ([place]) 07/20/2020  . Dyspnea 06/28/2020  . Chronic diarrhea - responds to budesonide 09/10/2019  . Iron deficiency anemia 08/19/2018  . Spondylosis of lumbar spine 12/24/2017  . Renal vein thrombosis ([place]) 09/17/2017  . Hoarse voice quality 09/09/2017  . Low back pain without sciatica 09/09/2017  . AKI (acute kidney injury) ([place]) 08/25/2017  . Labile blood glucose   . Sleep disturbance   . Leukocytosis   . Essential hypertension   . Urinary frequency   . Type 2 diabetes mellitus with peripheral neuropathy ([facility])   . Benign essential HTN   . Stage 2 chronic kidney disease   . Dysphagia   . Debility   . Encephalopathy   . Pressure injury of skin 08/04/2017  . Pneumonia of left lung due to infectious organism   . Diffuse pulmonary alveolar hemorrhage   . SOB (shortness of breath) 10/11/2016  . Hyperparathyroidism, secondary renal ([place]) 05/02/2016  . Gout 04/11/2015  . Diabetic neuropathy associated with type 2 diabetes mellitus ([place]) 01/06/2015  . SBO (small bowel obstruction) ([place]) 09/15/2013  . Breast cancer of lower-inner quadrant of right female breast ([place]) 03/18/2013  . Chronic venous insufficiency 01/06/2013  . Health care maintenance 05/08/2011  . Diabetes mellitus type 2 in obese ([place]) 05/03/2010  . Seasonal allergies 05/03/2010  . Labile hypertension   . CAD (coronary artery disease)   . Abdominal discomfort   . Anemia in chronic kidney disease   . History of stroke without residual deficits   . Hyperlipidemia associated with type 2 diabetes mellitus ([place])   . Chronic pain   . Chronic kidney disease, stage III (moderate) ([facility])   . Adnexal mass   .  Spinal stenosis, lumbar region, with neurogenic claudication 01/03/2009    Everardo All PT, DPT  07/27/20 5:48 PM   [facility] [address], [place], [place], [address] Phone: [phone]   Fax:  [phone]  Name: Shametra [name] Kraft MRN: [id number] Date of Birth: 11-10-1945

## 2020-07-28 ENCOUNTER — Other Ambulatory Visit: Payer: Self-pay | Admitting: Nurse Practitioner

## 2020-07-28 ENCOUNTER — Ambulatory Visit
Admission: RE | Admit: 2020-07-28 | Discharge: 2020-07-28 | Disposition: A | Discharge status: Home / Self Care | Payer: Medicare Other | Source: Ambulatory Visit | Attending: Nurse Practitioner | Admitting: Nurse Practitioner

## 2020-07-28 DIAGNOSIS — E7800 Pure hypercholesterolemia, unspecified: Secondary | ICD-10-CM | POA: Diagnosis not present

## 2020-07-28 DIAGNOSIS — I10 Essential (primary) hypertension: Secondary | ICD-10-CM | POA: Diagnosis not present

## 2020-07-28 DIAGNOSIS — C50311 Malignant neoplasm of lower-inner quadrant of right female breast: Secondary | ICD-10-CM

## 2020-07-28 DIAGNOSIS — D638 Anemia in other chronic diseases classified elsewhere: Secondary | ICD-10-CM | POA: Diagnosis not present

## 2020-07-28 DIAGNOSIS — D509 Iron deficiency anemia, unspecified: Secondary | ICD-10-CM | POA: Diagnosis not present

## 2020-07-28 DIAGNOSIS — Z7984 Long term (current) use of oral hypoglycemic drugs: Secondary | ICD-10-CM | POA: Diagnosis not present

## 2020-07-28 DIAGNOSIS — Z170 Estrogen receptor positive status [ER+]: Secondary | ICD-10-CM

## 2020-07-28 DIAGNOSIS — E1122 Type 2 diabetes mellitus with diabetic chronic kidney disease: Secondary | ICD-10-CM | POA: Diagnosis not present

## 2020-07-28 DIAGNOSIS — N6489 Other specified disorders of breast: Secondary | ICD-10-CM | POA: Diagnosis not present

## 2020-07-28 DIAGNOSIS — N6311 Unspecified lump in the right breast, upper outer quadrant: Secondary | ICD-10-CM | POA: Diagnosis not present

## 2020-07-28 DIAGNOSIS — N6315 Unspecified lump in the right breast, overlapping quadrants: Secondary | ICD-10-CM | POA: Diagnosis not present

## 2020-07-28 DIAGNOSIS — I82409 Acute embolism and thrombosis of unspecified deep veins of unspecified lower extremity: Secondary | ICD-10-CM | POA: Diagnosis not present

## 2020-07-28 DIAGNOSIS — I823 Embolism and thrombosis of renal vein: Secondary | ICD-10-CM | POA: Diagnosis not present

## 2020-07-28 DIAGNOSIS — I5033 Acute on chronic diastolic (congestive) heart failure: Secondary | ICD-10-CM | POA: Diagnosis not present

## 2020-07-28 DIAGNOSIS — N184 Chronic kidney disease, stage 4 (severe): Secondary | ICD-10-CM | POA: Diagnosis not present

## 2020-07-28 DIAGNOSIS — I2510 Atherosclerotic heart disease of native coronary artery without angina pectoris: Secondary | ICD-10-CM | POA: Diagnosis not present

## 2020-07-29 ENCOUNTER — Other Ambulatory Visit: Payer: Self-pay

## 2020-07-29 ENCOUNTER — Inpatient Hospital Stay: Payer: Medicare Other

## 2020-07-29 ENCOUNTER — Inpatient Hospital Stay: Payer: Medicare Other | Attending: Hematology

## 2020-07-29 ENCOUNTER — Ambulatory Visit: Payer: Medicare Other | Admitting: Physical Therapy

## 2020-07-29 ENCOUNTER — Inpatient Hospital Stay (HOSPITAL_BASED_OUTPATIENT_CLINIC_OR_DEPARTMENT_OTHER): Payer: Medicare Other | Admitting: Hematology

## 2020-07-29 VITALS — BP 133/50 | HR 79 | Temp 97.5°F | Resp 18 | Ht 64.0 in | Wt 177.6 lb

## 2020-07-29 DIAGNOSIS — R252 Cramp and spasm: Secondary | ICD-10-CM

## 2020-07-29 DIAGNOSIS — C50311 Malignant neoplasm of lower-inner quadrant of right female breast: Secondary | ICD-10-CM | POA: Diagnosis not present

## 2020-07-29 DIAGNOSIS — N184 Chronic kidney disease, stage 4 (severe): Secondary | ICD-10-CM | POA: Diagnosis not present

## 2020-07-29 DIAGNOSIS — Z170 Estrogen receptor positive status [ER+]: Secondary | ICD-10-CM | POA: Diagnosis not present

## 2020-07-29 DIAGNOSIS — M6281 Muscle weakness (generalized): Secondary | ICD-10-CM

## 2020-07-29 DIAGNOSIS — R262 Difficulty in walking, not elsewhere classified: Secondary | ICD-10-CM | POA: Diagnosis not present

## 2020-07-29 DIAGNOSIS — D509 Iron deficiency anemia, unspecified: Secondary | ICD-10-CM

## 2020-07-29 DIAGNOSIS — D631 Anemia in chronic kidney disease: Secondary | ICD-10-CM | POA: Insufficient documentation

## 2020-07-29 DIAGNOSIS — E538 Deficiency of other specified B group vitamins: Secondary | ICD-10-CM | POA: Diagnosis not present

## 2020-07-29 DIAGNOSIS — Z853 Personal history of malignant neoplasm of breast: Secondary | ICD-10-CM

## 2020-07-29 DIAGNOSIS — M25512 Pain in left shoulder: Secondary | ICD-10-CM

## 2020-07-29 DIAGNOSIS — M25511 Pain in right shoulder: Secondary | ICD-10-CM | POA: Diagnosis not present

## 2020-07-29 DIAGNOSIS — M5441 Lumbago with sciatica, right side: Secondary | ICD-10-CM | POA: Diagnosis not present

## 2020-07-29 LAB — CBC WITH DIFFERENTIAL (CANCER CENTER ONLY)
Abs Immature Granulocytes: 0.08 10*3/uL — ABNORMAL HIGH (ref 0.00–0.07)
Basophils Absolute: 0 10*3/uL (ref 0.0–0.1)
Basophils Relative: 0 %
Eosinophils Absolute: 0 10*3/uL (ref 0.0–0.5)
Eosinophils Relative: 0 %
HCT: 27.7 % — ABNORMAL LOW (ref 36.0–46.0)
Hemoglobin: 8.6 g/dL — ABNORMAL LOW (ref 12.0–15.0)
Immature Granulocytes: 1 %
Lymphocytes Relative: 19 %
Lymphs Abs: 1.9 10*3/uL (ref 0.7–4.0)
MCH: 28.4 pg (ref 26.0–34.0)
MCHC: 31 g/dL (ref 30.0–36.0)
MCV: 91.4 fL (ref 80.0–100.0)
Monocytes Absolute: 0.7 10*3/uL (ref 0.1–1.0)
Monocytes Relative: 7 %
Neutro Abs: 7.3 10*3/uL (ref 1.7–7.7)
Neutrophils Relative %: 73 %
Platelet Count: 169 10*3/uL (ref 150–400)
RBC: 3.03 MIL/uL — ABNORMAL LOW (ref 3.87–5.11)
RDW: 14.6 % (ref 11.5–15.5)
WBC Count: 10.1 10*3/uL (ref 4.0–10.5)
nRBC: 0 % (ref 0.0–0.2)

## 2020-07-29 LAB — VITAMIN D 25 HYDROXY (VIT D DEFICIENCY, FRACTURES): Vit D, 25-Hydroxy: 15.91 ng/mL — ABNORMAL LOW (ref 30–100)

## 2020-07-29 LAB — FERRITIN: Ferritin: 205 ng/mL (ref 11–307)

## 2020-07-29 MED ORDER — EPOETIN ALFA-EPBX 40000 UNIT/ML IJ SOLN
INTRAMUSCULAR | Status: AC
  Filled 2020-07-29: qty 1

## 2020-07-29 MED ORDER — EPOETIN ALFA-EPBX 40000 UNIT/ML IJ SOLN
40000.0000 [IU] | Freq: Once | INTRAMUSCULAR | Status: AC
  Administered 2020-07-29: 40000 [IU] via SUBCUTANEOUS

## 2020-07-29 MED ORDER — CYANOCOBALAMIN 1000 MCG/ML IJ SOLN
1000.0000 ug | Freq: Once | INTRAMUSCULAR | Status: AC
  Administered 2020-07-29: 1000 ug via INTRAMUSCULAR

## 2020-07-29 NOTE — Patient Instructions (Signed)
Epoetin Alfa injection What is this medicine? EPOETIN ALFA (e POE e tin AL fa) helps your body make more red blood cells. This medicine is used to treat anemia caused by chronic kidney disease, cancer chemotherapy, or HIV-therapy. It may also be used before surgery if you have anemia. This medicine may be used for other purposes; ask your health care provider or pharmacist if you have questions. COMMON BRAND NAME(S): Epogen, Procrit, Retacrit What should I tell my health care provider before I take this medicine? They need to know if you have any of these conditions:  cancer  heart disease  high blood pressure  history of blood clots  history of stroke  low levels of folate, iron, or vitamin B12 in the blood  seizures  an unusual or allergic reaction to erythropoietin, albumin, benzyl alcohol, hamster proteins, other medicines, foods, dyes, or preservatives  pregnant or trying to get pregnant  breast-feeding How should I use this medicine? This medicine is for injection into a vein or under the skin. It is usually given by a health care professional in a hospital or clinic setting. If you get this medicine at home, you will be taught how to prepare and give this medicine. Use exactly as directed. Take your medicine at regular intervals. Do not take your medicine more often than directed. It is important that you put your used needles and syringes in a special sharps container. Do not put them in a trash can. If you do not have a sharps container, call your pharmacist or healthcare provider to get one. A special MedGuide will be given to you by the pharmacist with each prescription and refill. Be sure to read this information carefully each time. Talk to your pediatrician regarding the use of this medicine in children. While this drug may be prescribed for selected conditions, precautions do apply. Overdosage: If you think you have taken too much of this medicine contact a poison  control center or emergency room at once. NOTE: This medicine is only for you. Do not share this medicine with others. What if I miss a dose? If you miss a dose, take it as soon as you can. If it is almost time for your next dose, take only that dose. Do not take double or extra doses. What may interact with this medicine? Interactions have not been studied. This list may not describe all possible interactions. Give your health care provider a list of all the medicines, herbs, non-prescription drugs, or dietary supplements you use. Also tell them if you smoke, drink alcohol, or use illegal drugs. Some items may interact with your medicine. What should I watch for while using this medicine? Your condition will be monitored carefully while you are receiving this medicine. You may need blood work done while you are taking this medicine. This medicine may cause a decrease in vitamin B6. You should make sure that you get enough vitamin B6 while you are taking this medicine. Discuss the foods you eat and the vitamins you take with your health care professional. What side effects may I notice from receiving this medicine? Side effects that you should report to your doctor or health care professional as soon as possible:  allergic reactions like skin rash, itching or hives, swelling of the face, lips, or tongue  seizures  signs and symptoms of a blood clot such as breathing problems; changes in vision; chest pain; severe, sudden headache; pain, swelling, warmth in the leg; trouble speaking; sudden numbness or  weakness of the face, arm or leg  signs and symptoms of a stroke like changes in vision; confusion; trouble speaking or understanding; severe headaches; sudden numbness or weakness of the face, arm or leg; trouble walking; dizziness; loss of balance or coordination Side effects that usually do not require medical attention (report to your doctor or health care professional if they continue or are  bothersome):  chills  cough  dizziness  fever  headaches  joint pain  muscle cramps  muscle pain  nausea, vomiting  pain, redness, or irritation at site where injected This list may not describe all possible side effects. Call your doctor for medical advice about side effects. You may report side effects to FDA at 1-800-FDA-1088. Where should I keep my medicine? Keep out of the reach of children. Store in a refrigerator between 2 and 8 degrees C (36 and 46 degrees F). Do not freeze or shake. Throw away any unused portion if using a single-dose vial. Multi-dose vials can be kept in the refrigerator for up to 21 days after the initial dose. Throw away unused medicine. NOTE: This sheet is a summary. It may not cover all possible information. If you have questions about this medicine, talk to your doctor, pharmacist, or health care provider.  2021 Elsevier/Gold Standard (2016-10-12 08:35:19)  Cyanocobalamin, Vitamin B12 injection What is this medicine? CYANOCOBALAMIN (sye an oh koe BAL a min) is a man made form of vitamin B12. Vitamin B12 is used in the growth of healthy blood cells, nerve cells, and proteins in the body. It also helps with the metabolism of fats and carbohydrates. This medicine is used to treat people who can not absorb vitamin B12. This medicine may be used for other purposes; ask your health care provider or pharmacist if you have questions. COMMON BRAND NAME(S): B-12 Compliance Kit, B-12 Injection Kit, Cyomin, LA-12, Nutri-Twelve, Physicians EZ Use B-12, Primabalt What should I tell my health care provider before I take this medicine? They need to know if you have any of these conditions:  kidney disease  Leber's disease  megaloblastic anemia  an unusual or allergic reaction to cyanocobalamin, cobalt, other medicines, foods, dyes, or preservatives  pregnant or trying to get pregnant  breast-feeding How should I use this medicine? This medicine is  injected into a muscle or deeply under the skin. It is usually given by a health care professional in a clinic or doctor's office. However, your doctor may teach you how to inject yourself. Follow all instructions. Talk to your pediatrician regarding the use of this medicine in children. Special care may be needed. Overdosage: If you think you have taken too much of this medicine contact a poison control center or emergency room at once. NOTE: This medicine is only for you. Do not share this medicine with others. What if I miss a dose? If you are given your dose at a clinic or doctor's office, call to reschedule your appointment. If you give your own injections and you miss a dose, take it as soon as you can. If it is almost time for your next dose, take only that dose. Do not take double or extra doses. What may interact with this medicine?  colchicine  heavy alcohol intake This list may not describe all possible interactions. Give your health care provider a list of all the medicines, herbs, non-prescription drugs, or dietary supplements you use. Also tell them if you smoke, drink alcohol, or use illegal drugs. Some items may interact with   your medicine. What should I watch for while using this medicine? Visit your doctor or health care professional regularly. You may need blood work done while you are taking this medicine. You may need to follow a special diet. Talk to your doctor. Limit your alcohol intake and avoid smoking to get the best benefit. What side effects may I notice from receiving this medicine? Side effects that you should report to your doctor or health care professional as soon as possible:  allergic reactions like skin rash, itching or hives, swelling of the face, lips, or tongue  blue tint to skin  chest tightness, pain  difficulty breathing, wheezing  dizziness  red, swollen painful area on the leg Side effects that usually do not require medical attention (report  to your doctor or health care professional if they continue or are bothersome):  diarrhea  headache This list may not describe all possible side effects. Call your doctor for medical advice about side effects. You may report side effects to FDA at 1-800-FDA-1088. Where should I keep my medicine? Keep out of the reach of children. Store at room temperature between 15 and 30 degrees C (59 and 85 degrees F). Protect from light. Throw away any unused medicine after the expiration date. NOTE: This sheet is a summary. It may not cover all possible information. If you have questions about this medicine, talk to your doctor, pharmacist, or health care provider.  2021 Elsevier/Gold Standard (2007-06-16 22:10:20)   

## 2020-07-29 NOTE — Patient Instructions (Signed)
Access Code: [email or web address] URL: [email or web address] Date: 07/29/2020 Prepared by: Ruben Im  Exercises Supine Lower Trunk Rotation - 2 x daily - 7 x weekly - 1 sets - 3 reps - 10s hold Hook Lying Single Knee to Chest Stretch with Towel - 2 x daily - 7 x weekly - 1 sets - 3 reps - 10s hold Seated Hip Abduction with Resistance - 1 x daily - 7 x weekly - 2 sets - 10 reps Seated Isometric Hip Adduction with Ball - 1 x daily - 7 x weekly - 2 sets - 10 reps Standing Row with Anchored Resistance - 1 x daily - 7 x weekly - 2 sets - 10 reps Shoulder extension with resistance - Neutral - 1 x daily - 7 x weekly - 2 sets - 10 reps Supine Shoulder Flexion PROM to 90 Degrees - 1 x daily - 7 x weekly - 1 sets - 5 reps - 5 hold Seated Isometric Shoulder Flexion - 1 x daily - 7 x weekly - 1 sets - 5 reps - 5 hold Seated Isometric Shoulder Abduction - 1 x daily - 7 x weekly - 1 sets - 5 reps - 5 hold Seated Shoulder Flexion Towel Slide at Table Top - 1 x daily - 7 x weekly - 1 sets - 5 reps - 5 hold

## 2020-07-29 NOTE — Therapy (Addendum)
[facility] - [facility] [address], [place], [place], [address] Phone: [phone]   Fax:  [phone]  Physical Therapy Treatment  Patient Details  Name: [name] MRN: [id number] Date of Birth: 31-Jul-1945 Referring Provider (PT): Marchia Bond, MD   Encounter Date: 07/29/2020   PT End of Session - 07/29/20 1222    Visit Number 4    Date for PT Re-Evaluation 09/09/20    Authorization Type [employer] Medicare    Progress Note Due on Visit 10    PT Start Time 0930    PT Stop Time 1015    PT Time Calculation (min) 45 min    Activity Tolerance Patient tolerated treatment well;Patient limited by pain           Past Medical History:  Diagnosis Date  . Abdominal discomfort    Chronic N/V/D. Presumptive dx Crohn's dx per elevated p ANCA. Failed Entocort and Pentasa. Sep 2003 - ileocolectomy c anastomosis per Dr Deon Pilling 2/2 adhesions - path was hegative for Crohns. EGD, Sm bowel follow through (11/03), and an eteroclysis (10/03) were unrevealing. Cuases hypomag and hypocalcemia.  . Adnexal mass 8/03   s/p lap BSO (R ovarian fibroma) & lysis of adhesions  . Allergy    Seasonal  . Anemia    Multifactorial. Baseline HgB 10-11 ish. B12 def - 150 in 3/10. Fe Def - ferritin 35 3/10. Both are being repleted.  . Breast cancer ([place]) 03/16/13   right, 5 o'clock  . CAD (coronary artery disease) 1996   1996 - PTCA and angioplasty diagonal branch. 2000 - Rotoblator & angiopllasty of diagonal. 2006 - subendocardial AMI, DES to proximal LAD.Marland Kitchen Also had 90% stenosis in distal apical LAD. EF 55 with apical hypokinesis. Indefinite ASA and Plavix.  . CHF (congestive heart failure) ([place])   . Chronic kidney disease    Chronic renal insuff baseline Cr 1.2 - 1.4 ish.  . Chronic pain    CT 10/10 = Spinal stenosis L2 - S1.  . Diabetes mellitus    Insulin dependent  . Gout   . Hx of radiation therapy 06/02/13- 07/16/13   right rbeast 4500 cGy 25  sessions, right breast boost 1600 cGy in 8 sessions  . Hyperlipidemia    Managed with both a statin and Welchol. Welchol stopped 2014 2/2 cost and started on fenofibrate   . Hypertension    2006 B renal arteries patent. 2003 MRA - no RAS. 2003 pheo W/U Dr Hassell Done reportedly negative.  . Hypoxia 07/23/2017  . Lupus ([place])   . Lymphedema of breast   . Personal history of radiation therapy 2015  . RBBB   . Renal vein thrombosis ([place])   . SBO (small bowel obstruction) ([place]) 09/17/2017  . Secondary hyperparathyroidism ([place])   . Stroke [facility])    Incidental finding MRI 2002 L lacunar infarct  . Vitamin B12 deficiency   . Vitamin D deficiency   . Wears dentures    top    Past Surgical History:  Procedure Laterality Date  . ABDOMINAL HYSTERECTOMY    . BILATERAL SALPINGOOPHORECTOMY  8/03   Lap BSO (R ovarian fibroma) and adhesion lysis  . BOWEL RESECTION  2003   ileocolectomy with anastomosis 2/2 adhesions  . BREAST BIOPSY Right 2015  . BREAST BIOPSY Right 07/2014  . BREAST LUMPECTOMY Right 04/22/2013  . BREAST LUMPECTOMY WITH NEEDLE LOCALIZATION AND AXILLARY SENTINEL LYMPH NODE BX Right 04/22/2013   Procedure: BREAST LUMPECTOMY WITH NEEDLE LOCALIZATION AND AXILLARY SENTINEL  LYMPH NODE BX;  Surgeon: Stark Klein, MD;  Location: [place];  Service: General;  Laterality: Right;  . CARDIAC CATHETERIZATION     2 stents  . CHOLECYSTECTOMY    . COLONOSCOPY    . HEMICOLECTOMY     R sided hemicolectomy  . HERNIA REPAIR     Ventral hernia repair  . PTCA  4/06    There were no vitals filed for this visit.   Subjective Assessment - 07/29/20 0929    Subjective Thought the patch helped.  Pain left shoulder.  Right shoulder is fine.  Hurts at rest too.  Some pain at night. Presents with RW.    Pertinent History history of Rt breast cancer s/p lumpectomy (2015), type 2 diabetes, HTN, Rt LE DVT    Patient Stated Goals ambulation without AD;  decreased pain    Currently in Pain? Yes     Pain Score 8     Pain Location Shoulder    Pain Orientation Left                             [facility] Adult PT Treatment/Exercise - 07/29/20 0001      Shoulder Exercises: Supine   Other Supine Exercises assisted elevation to 90 degrees with small arcs 12/6:00 7x and 9/3:00 7x      Shoulder Exercises: Seated   Extension Strengthening;Left;10 reps    Theraband Level (Shoulder Extension) Level 1 (Yellow)    Row Strengthening;Left;10 reps    Theraband Level (Shoulder Row) Level 1 (Yellow)    Other Seated Exercises UE Ranger on floor front/back 15x; circles too painful      Shoulder Exercises: Standing   Other Standing Exercises left UE pillow case slides on railing 8x      Shoulder Exercises: Isometric Strengthening   Flexion 5X5"    Flexion Limitations seated    Extension 5X5"    Extension Limitations seated    ABduction 5X5"    ABduction Limitations seated      Iontophoresis   Type of Iontophoresis Dexamethasone    Location left post shoulder    Dose 1 cc    Time 4-6 hour patch      Manual Therapy   Manual therapy comments active assisted left shoulder elevation 10x; gentle rhythmic stab at 90 degrees 15 sec x2                  PT Education - 07/29/20 1015    Education Details shoulder isometrics; place and hold supine 90 degrees; table slides    Person(s) Educated Patient    Methods Explanation;Demonstration;Handout    Comprehension Verbalized understanding;Returned demonstration            PT Short Term Goals - 07/27/20 1745      PT SHORT TERM GOAL #2   Title Patient will demonstrate 4+/5 Rt LE strength to more readily complete functional transfers    Baseline grossly 4-/5    Time 4    Period Weeks    Status On-going    Target Date 08/09/20      PT SHORT TERM GOAL #4   Title Patient will demonstrate 120 degrees Rt shoulder flexion and abduction to more readily complete ADLs.    Baseline Flexion 89; Abduction 79    Time 4     Period Weeks    Status On-going   approximately the same as initial evaluation   Target Date 08/12/20  PT Long Term Goals - 07/12/20 1642      PT LONG TERM GOAL #1   Title Patient will be independent with advanced HEP for long term management of symptoms post D/C.    Time 8    Period Weeks    Status New    Target Date 09/06/20      PT LONG TERM GOAL #2   Title Patient will improved FOTO score by 10 points to indicate improved overall function.    Time 8    Period Weeks    Status New    Target Date 09/06/20      PT LONG TERM GOAL #3   Title Patient will perform five times sit to stand in 12s or less with good eccentric control for decreased fall risk    Baseline 18s; poor ecccentric control    Time 8    Period Weeks    Status New    Target Date 09/06/20      PT LONG TERM GOAL #4   Title Patient will lift 10# from floor level using proper mechanics x5 reps without increased pain to more readily complete ADLs.    Time 8    Period Weeks    Status New    Target Date 09/06/20      PT LONG TERM GOAL #5   Title Patient will complete six minute walk test without AD, minimal gait deviations, and without increased pain to indicate readiness for home and community ambulation    Time 8    Period Weeks    Status New    Target Date 09/06/20                 Plan - 07/29/20 1223    Clinical Impression Statement The patient's primary complaint is left shoulder pain.  Active and active assisted motions are quite pain limited.  Therapist continually modifying exercises to keep pain at a tolerable level.  Overall fair response to place/holds in supine and seated ROM below 45 degrees.  Treatment focus on left shoulder as this is her primary complaint today.    Comorbidities history of Rt breast cancer s/p lumpectomy (2015), type 2 diabetes, HTN, Rt LE DVT    Examination-Activity Limitations Bend;Carry;Lift;Locomotion Level;Reach Overhead;Sit;Stand;Transfers     Stability/Clinical Decision Making Evolving/Moderate complexity    Rehab Potential Fair    PT Frequency 2x / week    PT Duration 8 weeks    PT Treatment/Interventions ADLs/Self Care Home Management;Aquatic Therapy;Cryotherapy;Electrical Stimulation;Iontophoresis 66m/ml Dexamethasone;Moist Heat;Traction;Gait training;Functional mobility training;Therapeutic activities;Therapeutic exercise;Balance training;Neuromuscular re-education;Patient/family education;Manual techniques;Passive range of motion;Dry needling;Taping;Spinal Manipulations;Joint Manipulations    PT Next Visit Plan left shoulder ROM, isometrics for Lt shoulder; low intensity strengthening; core and glute strength for lumbar    PT Home Exercise [facility]          Patient will benefit from skilled therapeutic intervention in order to improve the following deficits and impairments:  Abnormal gait,Decreased activity tolerance,Decreased balance,Decreased endurance,Decreased range of motion,Decreased safety awareness,Decreased strength,Difficulty walking,Hypomobility,Increased fascial restricitons,Increased muscle spasms,Impaired UE functional use,Improper body mechanics,Postural dysfunction,Pain  Visit Diagnosis: Cramp and spasm  Muscle weakness (generalized)  Acute pain of left shoulder     Problem List Patient Active Problem List   Diagnosis Date Noted  . CHF (congestive heart failure) ([place] 07/20/2020  . Dyspnea 06/28/2020  . Chronic diarrhea - responds to budesonide 09/10/2019  . Iron deficiency anemia 08/19/2018  . Spondylosis of lumbar spine 12/24/2017  . [place]  09/17/2017  . Hoarse voice quality 09/09/2017  . Low back pain without sciatica 09/09/2017  . AKI (acute kidney injury) ([place]) 08/25/2017  . Labile blood glucose   . Sleep disturbance   . Leukocytosis   . Essential hypertension   . Urinary frequency   . Type 2 diabetes mellitus with peripheral neuropathy (H[facility])   .  Benign essential HTN   . Stage 2 chronic kidney disease   . Dysphagia   . Debility   . Encephalopathy   . Pressure injury of skin 08/04/2017  . Pneumonia of left lung due to infectious organism   . Diffuse pulmonary alveolar hemorrhage   . SOB (shortness of breath) 10/11/2016  . Hyperparathyroidism, secondary renal ([place]) 05/02/2016  . Gout 04/11/2015  . Diabetic neuropathy associated with type 2 diabetes mellitus ([place]) 01/06/2015  . SBO (small bowel obstruction) ([place]) 09/15/2013  . Breast cancer of lower-inner quadrant of right female breast ([place]) 03/18/2013  . Chronic venous insufficiency 01/06/2013  . Health care maintenance 05/08/2011  . Diabetes mellitus type 2 in obese ([place]) 05/03/2010  . Seasonal allergies 05/03/2010  . Labile hypertension   . CAD (coronary artery disease)   . Abdominal discomfort   . Anemia in chronic kidney disease   . History of stroke without residual deficits   . Hyperlipidemia associated with type 2 diabetes mellitus ([place])   . Chronic pain   . Chronic kidney disease, stage III (moderate) (HCC)   . Adnexal mass   . Spinal stenosis, lumbar region, with neurogenic claudication 01/03/2009   Ruben Im, PT 07/29/20 12:33 PM Phone: [phone] Fax: [phone] Alvera Singh 07/29/2020, 12:33 PM  [facility] [address] [place], [place], [address] Phone: [phone]   Fax:  [phone]  Name: [name] MRN: [id number] Date of Birth: [birth year]   PHYSICAL THERAPY DISCHARGE SUMMARY  Visits from Start of Care: 4  Current functional level related to goals / functional outcomes: See above   Remaining deficits: See above   Education / Equipment: See above  Plan: Patient agrees to discharge.  Patient goals were not met. Patient is being discharged due to not returning since the last visit.  ?????     Everardo All PT, DPT  08/09/20 11:26 AM

## 2020-07-30 ENCOUNTER — Encounter: Payer: Self-pay | Admitting: Hematology

## 2020-08-01 ENCOUNTER — Other Ambulatory Visit: Payer: Self-pay

## 2020-08-01 ENCOUNTER — Telehealth: Payer: Self-pay | Admitting: Hematology

## 2020-08-01 ENCOUNTER — Encounter: Payer: Self-pay | Admitting: Speech Pathology

## 2020-08-01 ENCOUNTER — Ambulatory Visit: Payer: Medicare Other | Admitting: Speech Pathology

## 2020-08-01 ENCOUNTER — Ambulatory Visit: Payer: Medicare Other | Admitting: Occupational Therapy

## 2020-08-01 ENCOUNTER — Encounter: Payer: Self-pay | Admitting: Occupational Therapy

## 2020-08-01 DIAGNOSIS — R262 Difficulty in walking, not elsewhere classified: Secondary | ICD-10-CM | POA: Diagnosis not present

## 2020-08-01 DIAGNOSIS — M6281 Muscle weakness (generalized): Secondary | ICD-10-CM | POA: Diagnosis not present

## 2020-08-01 DIAGNOSIS — M5441 Lumbago with sciatica, right side: Secondary | ICD-10-CM | POA: Diagnosis not present

## 2020-08-01 DIAGNOSIS — R252 Cramp and spasm: Secondary | ICD-10-CM | POA: Diagnosis not present

## 2020-08-01 DIAGNOSIS — M25512 Pain in left shoulder: Secondary | ICD-10-CM | POA: Diagnosis not present

## 2020-08-01 DIAGNOSIS — M25511 Pain in right shoulder: Secondary | ICD-10-CM | POA: Diagnosis not present

## 2020-08-01 DIAGNOSIS — R41841 Cognitive communication deficit: Secondary | ICD-10-CM

## 2020-08-01 NOTE — Telephone Encounter (Signed)
Scheduled follow-up appointments per 5/13 los. Patient is aware.

## 2020-08-01 NOTE — Therapy (Signed)
[facility] [address] [place], [place], [address] Phone: [phone]   Fax:  [phone]  Speech Language Pathology Evaluation  Patient Details  Name: [name] MRN: [id number] Date of Birth: 13-Dec-1945 Referring Provider (SLP): Dr. Alysia Penna   Encounter Date: 08/01/2020   End of Session - 08/01/20 1137    Visit Number 1    Number of Visits 17    Date for SLP Re-Evaluation 09/26/20    Authorization Type none    SLP Start Time 1015    SLP Stop Time  1100    SLP Time Calculation (min) 45 min    Activity Tolerance Patient tolerated treatment well;Patient limited by pain           Past Medical History:  Diagnosis Date  . Abdominal discomfort    Chronic N/V/D. Presumptive dx Crohn's dx per elevated p ANCA. Failed Entocort and Pentasa. Sep 2003 - ileocolectomy c anastomosis per Dr Deon Pilling 2/2 adhesions - path was hegative for Crohns. EGD, Sm bowel follow through (11/03), and an eteroclysis (10/03) were unrevealing. Cuases hypomag and hypocalcemia.  . Adnexal mass 8/03   s/p lap BSO (R ovarian fibroma) & lysis of adhesions  . Allergy    Seasonal  . Anemia    Multifactorial. Baseline HgB 10-11 ish. B12 def - 150 in 3/10. Fe Def - ferritin 35 3/10. Both are being repleted.  . Breast cancer ([facility]) 03/16/13   right, 5 o'clock  . CAD (coronary artery disease) 1996   1996 - PTCA and angioplasty diagonal branch. 2000 - Rotoblator & angiopllasty of diagonal. 2006 - subendocardial AMI, DES to proximal LAD.Marland Kitchen Also had 90% stenosis in distal apical LAD. EF 55 with apical hypokinesis. Indefinite ASA and Plavix.  . CHF (congestive heart failure) ([facility])   . Chronic kidney disease    Chronic renal insuff baseline Cr 1.2 - 1.4 ish.  . Chronic pain    CT 10/10 = Spinal stenosis L2 - S1.  . Diabetes mellitus    Insulin dependent  . Gout   . Hx of radiation therapy 06/02/13- 07/16/13   right rbeast 4500 cGy 25  sessions, right breast boost 1600 cGy in 8 sessions  . Hyperlipidemia    Managed with both a statin and Welchol. Welchol stopped 2014 2/2 cost and started on fenofibrate   . Hypertension    2006 B renal arteries patent. 2003 MRA - no RAS. 2003 pheo W/U Dr Hassell Done reportedly negative.  . Hypoxia 07/23/2017  . Lupus ([place])   . Lymphedema of breast   . Personal history of radiation therapy 2015  . RBBB   . Renal vein thrombosis ([facility])   . SBO (small bowel obstruction) ([facility]) 09/17/2017  . Secondary hyperparathyroidism ([facility])   . Stroke [facility])    Incidental finding MRI 2002 L lacunar infarct  . Vitamin B12 deficiency   . Vitamin D deficiency   . Wears dentures    top    Past Surgical History:  Procedure Laterality Date  . ABDOMINAL HYSTERECTOMY    . BILATERAL SALPINGOOPHORECTOMY  8/03   Lap BSO (R ovarian fibroma) and adhesion lysis  . BOWEL RESECTION  2003   ileocolectomy with anastomosis 2/2 adhesions  . BREAST BIOPSY Right 2015  . BREAST BIOPSY Right 07/2014  . BREAST LUMPECTOMY Right 04/22/2013  . BREAST LUMPECTOMY WITH NEEDLE LOCALIZATION AND AXILLARY SENTINEL LYMPH NODE BX Right 04/22/2013   Procedure: BREAST LUMPECTOMY WITH NEEDLE LOCALIZATION AND AXILLARY SENTINEL LYMPH NODE BX;  Surgeon: Stark Klein, MD;  Location: [facility];  Service: General;  Laterality: Right;  . CARDIAC CATHETERIZATION     2 stents  . CHOLECYSTECTOMY    . COLONOSCOPY    . HEMICOLECTOMY     R sided hemicolectomy  . HERNIA REPAIR     Ventral hernia repair  . PTCA  4/06    There were no vitals filed for this visit.       SLP Evaluation [facility] - 08/01/20 1024      SLP Visit Information   SLP Received On 08/01/20    Referring Provider (SLP) Dr. Alysia Penna    Onset Date 07/20/20    Medical Diagnosis spinal stensosis      Subjective   Subjective "My daughter had me come here"    Patient/Family Stated Goal "I don't have any"      Pain Assessment   Currently in Pain? No/denies       General Information   HPI [name] is being followed by Dr. Letta Pate for spinal stenosis. He notes functional decine. Recent hospitalizations for fluid overload due to mis managing her sodium in take. Per Dr. Dianna Limbo note, family is concerned re: pt's memory/cognition. She [name] had several falls recently, [name] stopped working and is not leaving the house much. She is receiving PT at [facility].    Mobility Status uses RW outside of home      Balance Screen   [name] the patient fallen in the past 6 months Yes    How many times? 1x    [name] the patient had a decrease in activity level because of a fear of falling?  No    Is the patient reluctant to leave their home because of a fear of falling?  No      Prior Functional Status   Cognitive/Linguistic Baseline Baseline deficits    Baseline deficit details memory    Type of Home Apartment     Lives With Alone    Available Support Family    Vocation Retired      Associate Professor   Overall Cognitive Status Impaired/Different from baseline    Area of Impairment Attention;Memory;Safety/judgement;Awareness;Problem solving    Attention Sustained;Selective    Sustained Attention Appears intact    Selective Attention Impaired    Selective Attention Impairment Verbal basic;Functional basic    Memory Impaired    Memory Impairment Storage deficit;Decreased recall of new information;Decreased short term memory    Decreased Short Term Memory Verbal basic;Functional basic    Awareness Impaired    Awareness Impairment Intellectual impairment    Problem Solving Impaired    Problem Solving Impairment Verbal basic;Functional basic    Corporate treasurer;Sequencing;Organizing;Decision Making;Initiating;Self Monitoring;Self Correcting      Auditory Comprehension   Overall Auditory Comprehension Appears within functional limits for tasks assessed    Interfering Components Attention;Processing speed;Working memory      Reading Comprehension   Reading  Status Not tested      Verbal Expression   Overall Verbal Expression Impaired    Initiation No impairment    Naming Impairment    Responsive 76-100% accurate    Confrontation 75-100% accurate    Convergent Not tested    Divergent 25-49% accurate    Pragmatics No impairment      Written Expression   Dominant Hand Right      Oral Motor/Sensory Function   Overall Oral Motor/Sensory Function Appears within functional limits for tasks assessed      Motor Speech  Overall Motor Speech Appears within functional limits for tasks assessed      Standardized Assessments   Standardized Assessments  Cognitive Linguistic Quick Test      Cognitive Linguistic Quick Test (Ages 18-69)   Attention Moderate    Memory Mild    Executive Function Moderate    Language Moderate    Visuospatial Skills Mild    Severity Rating Total 12    Composite Severity Rating 9.6                             SLP Short Term Goals - 08/01/20 1230      SLP SHORT TERM GOAL #1   Title Pt will use external aids/memory station to recall schedule, to do list and conversations with occasional min A from family over 2 weeks.    Time 4    Period Weeks    Status New      SLP SHORT TERM GOAL #2   Title Pt will use external aids to recall items needed for appointments (glasses, notebook, etc) with occasional min A over 2 sessions    Time 4    Period Weeks    Status New      SLP SHORT TERM GOAL #3   Title Mry will verbalize intellectual awareness of errors by self evaluating performance and outcomes on ADL/IADL's    Time 4    Period Weeks    Status New      SLP SHORT TERM GOAL #4   Title Family will complete memory PRO (goals may be modified following this)    Time 2    Period Weeks    Status New            SLP Long Term Goals - 08/01/20 1234      SLP LONG TERM GOAL #1   Title Pt will use external aids to recall conversations, pertinent information to reduce asking same question or  repeating herself by 50% as subjectively rated by family    Time 8    Period Weeks    Status New      SLP LONG TERM GOAL #2   Title Pt will verbalize 3 safety concerns due to reduced attention and memory and with ST support, generate 3 strategies to improve safety with ADL/IADL's    Time 8    Period Weeks    Status New      SLP LONG TERM GOAL #3   Title Pt will demonstrate emergent awareness of cognitive impairments by carrying over 3 compensatory strategies for cognition in the kitchen and bill paying with occasional min A from family    Time 8    Period Weeks    Status New      SLP LONG TERM GOAL #4   Title Pt will use chart/log or other external adis to complete PT HEP and monitor sodium/liquid intake with occasional min A    Time 8    Period Weeks    Status New            Plan - 08/01/20 1214    Clinical Impression Statement [name] is referred for outpt ST due to family concerns re: decline in cognition and memory. Today, [name] is unaccompanied. She denies cognitive changes or memory impairment. She reports she is managing her appointments, cooking, medications independently without error. She is driving. She was recently hopsitalized for fluid overload due to not managing salt in her  cooking per MD/RD recommendations. She [name] had several falls, today she reported 1 fall. Today, she presents with moderate cognitive linguistic impairments.  The Cognitive Linguistic Quick Test revealed moderate impairments in attention, executive functions, language and clodk drawing. Mild impairments in memory and visuospatial skills. [name] recalled 3/18 details on Story Recall [name] lost something") then she immidately forgot the characters name is [name].  Her clock drawing had numbers in order, but mis-spaced so the 9 was where the 6 should be. She drew a line from the 10 to the 11, rather than starting hand in the middle. [name] did not identify her errors when questioned.  She named  6 animals in 1  minute due to memory and attention, and named 2 "m" words in 1 minute, forgetting instruction to not use proper nouns.   Due to conflicts in pt performance on the CLQT and [name]'s denial of cognitive changes, I, phoned her daughter, [name], who is a Marine scientist at [employer]. [name] endorses her mom will ask the same question over and over, not recalling she just asked. She describes [place] as very forgetful. Family is concerned that [place] may leave the stove on or burn her food. At this time, family is prepping [name]'s meals to help her control her sodium intake. Family is concerned about safety as well. I recommend skilled ST to for cognition and compensatory strategies for cognition to maximize safety, pt's independence and reduce family burden. At this time, I question her safety on the road. ST will be effective if family attends sessions to help [name] carryover strategies. [name] aware and states family can attend sessions after 3:00pm.    Speech Therapy Frequency 2x / week    Duration 8 weeks   17 visits   Treatment/Interventions Language facilitation;Environmental controls;SLP instruction and feedback;Compensatory strategies;Functional tasks;Cognitive reorganization;Compensatory techniques;Patient/family education;Multimodal communcation approach;Internal/external aids;Cueing hierarchy    Potential to Achieve Goals Fair    Potential Considerations Ability to learn/carryover information           Patient will benefit from skilled therapeutic intervention in order to improve the following deficits and impairments:   Cognitive communication deficit - Plan: SLP plan of care cert/re-cert    Problem List Patient Active Problem List   Diagnosis Date Noted  . CHF (congestive heart failure) ([facility]) 07/20/2020  . Dyspnea 06/28/2020  . Chronic diarrhea - responds to budesonide 09/10/2019  . Iron deficiency anemia 08/19/2018  . Spondylosis of lumbar spine 12/24/2017  . Renal vein thrombosis ([place]) 09/17/2017  . Hoarse voice  quality 09/09/2017  . Low back pain without sciatica 09/09/2017  . AKI (acute kidney injury) ([facility]) 08/25/2017  . Labile blood glucose   . Sleep disturbance   . Leukocytosis   . Essential hypertension   . Urinary frequency   . Type 2 diabetes mellitus with peripheral neuropathy ([facility])   . Benign essential HTN   . Stage 2 chronic kidney disease   . Dysphagia   . Debility   . Encephalopathy   . Pressure injury of skin 08/04/2017  . Pneumonia of left lung due to infectious organism   . Diffuse pulmonary alveolar hemorrhage   . SOB (shortness of breath) 10/11/2016  . Hyperparathyroidism, secondary renal ([place]) 05/02/2016  . Gout 04/11/2015  . Diabetic neuropathy associated with type 2 diabetes mellitus ([place]) 01/06/2015  . SBO (small bowel obstruction) ([place]) 09/15/2013  . Breast cancer of lower-inner quadrant of right female breast ([place]) 03/18/2013  . Chronic venous insufficiency 01/06/2013  . Health care maintenance 05/08/2011  .  Diabetes mellitus type 2 in obese ([place]) 05/03/2010  . Seasonal allergies 05/03/2010  . Labile hypertension   . CAD (coronary artery disease)   . Abdominal discomfort   . Anemia in chronic kidney disease   . History of stroke without residual deficits   . Hyperlipidemia associated with type 2 diabetes mellitus ([place])   . Chronic pain   . Chronic kidney disease, stage III (moderate) ([facility])   . Adnexal mass   . Spinal stenosis, lumbar region, with neurogenic claudication 01/03/2009    [name], Annye [place] MS, CCC-SLP 08/01/2020, 12:51 PM  [facility] [address] [place], [place], [address] Phone: [phone]   Fax:  [phone]  Name: [name] MRN: [id number] Date of Birth: 1945/09/27

## 2020-08-01 NOTE — Therapy (Signed)
[facility] [address] [place], [place], [address] Phone: [phone]   Fax:  [phone]  Occupational Therapy Evaluation/One Time Visit  Patient Details  Name: [name] MRN: [id number] Date of Birth: 1945-05-12 Referring Provider (OT): Alysia Penna, MD   Encounter Date: 08/01/2020   OT End of Session - 08/01/20 1052    Visit Number 1   one visit only   Number of Visits 1    Authorization Type [employer] [place]    Authorization Time Period VL: $30 copay    OT Start Time 1100    OT Stop Time 1140    OT Time Calculation (min) 40 min    Activity Tolerance Patient tolerated treatment well    Behavior During Therapy [facility] for tasks assessed/performed           Past Medical History:  Diagnosis Date  . Abdominal discomfort    Chronic N/V/D. Presumptive dx Crohn's dx per elevated p ANCA. Failed Entocort and Pentasa. Sep 2003 - ileocolectomy c anastomosis per Dr Deon Pilling 2/2 adhesions - path was hegative for Crohns. EGD, Sm bowel follow through (11/03), and an eteroclysis (10/03) were unrevealing. Cuases hypomag and hypocalcemia.  . Adnexal mass 8/03   s/p lap BSO (R ovarian fibroma) & lysis of adhesions  . Allergy    Seasonal  . Anemia    Multifactorial. Baseline HgB 10-11 ish. B12 def - 150 in 3/10. Fe Def - ferritin 35 3/10. Both are being repleted.  . Breast cancer ([place]) 03/16/13   right, 5 o'clock  . CAD (coronary artery disease) 1996   1996 - PTCA and angioplasty diagonal branch. 2000 - Rotoblator & angiopllasty of diagonal. 2006 - subendocardial AMI, DES to proximal LAD.Marland Kitchen Also had 90% stenosis in distal apical LAD. EF 55 with apical hypokinesis. Indefinite ASA and Plavix.  . CHF (congestive heart failure) ([place])   . Chronic kidney disease    Chronic renal insuff baseline Cr 1.2 - 1.4 ish.  . Chronic pain    CT 10/10 = Spinal stenosis L2 - S1.  . Diabetes mellitus    Insulin dependent  . Gout    . Hx of radiation therapy 06/02/13- 07/16/13   right rbeast 4500 cGy 25 sessions, right breast boost 1600 cGy in 8 sessions  . Hyperlipidemia    Managed with both a statin and Welchol. Welchol stopped 2014 2/2 cost and started on fenofibrate   . Hypertension    2006 B renal arteries patent. 2003 MRA - no RAS. 2003 pheo W/U Dr Hassell Done reportedly negative.  . Hypoxia 07/23/2017  . Lupus ([place])   . Lymphedema of breast   . Personal history of radiation therapy 2015  . RBBB   . Renal vein thrombosis ([place])   . SBO (small bowel obstruction) ([place]) 09/17/2017  . Secondary hyperparathyroidism ([place])   . Stroke [facility])    Incidental finding MRI 2002 L lacunar infarct  . Vitamin B12 deficiency   . Vitamin D deficiency   . Wears dentures    top    Past Surgical History:  Procedure Laterality Date  . ABDOMINAL HYSTERECTOMY    . BILATERAL SALPINGOOPHORECTOMY  8/03   Lap BSO (R ovarian fibroma) and adhesion lysis  . BOWEL RESECTION  2003   ileocolectomy with anastomosis 2/2 adhesions  . BREAST BIOPSY Right 2015  . BREAST BIOPSY Right 07/2014  . BREAST LUMPECTOMY Right 04/22/2013  . BREAST LUMPECTOMY WITH NEEDLE LOCALIZATION AND AXILLARY SENTINEL LYMPH NODE BX  Right 04/22/2013   Procedure: BREAST LUMPECTOMY WITH NEEDLE LOCALIZATION AND AXILLARY SENTINEL LYMPH NODE BX;  Surgeon: Stark Klein, MD;  Location: [facility];  Service: General;  Laterality: Right;  . CARDIAC CATHETERIZATION     2 stents  . CHOLECYSTECTOMY    . COLONOSCOPY    . HEMICOLECTOMY     R sided hemicolectomy  . HERNIA REPAIR     Ventral hernia repair  . PTCA  4/06    There were no vitals filed for this visit.   Subjective Assessment - 08/01/20 1138    Subjective  Pt is a 75 year old that presents to [facility] ref for Lumbar spinal spenosis and LUE shoulder pain and weakness (possible RTC per MD note on 5/3). Pt is currently seeing PT at [facility] where they are addressing the LUE shoulder ROM and weakness. Pt unable  to report any difficulty with ADLs, IADLs or goals for therapy.    Pertinent History PMHx:, DM-2, CAD, Breast cancer in remission on right, anemia of renal disease, gout, DVT, Crohn's disease, pulm HTN, HTN, spinal stenosis, chronic pain    Limitations History of Falls, Cognitive decline    Patient Stated Goals "my daughter wanted me to come"    Currently in Pain? Yes    Pain Score 8    pt reported, "not bad - like an 8"   Pain Location Shoulder    Pain Orientation Left    Pain Descriptors / Indicators Aching    Pain Type Chronic pain    Pain Onset More than a month ago    Pain Frequency Intermittent    Aggravating Factors  reaching and moving arm             [facility] OT Assessment - 08/01/20 1109      Assessment   Medical Diagnosis Spinal Stenosis    Referring Provider (OT) Alysia Penna, MD    Onset Date/Surgical Date 07/14/20   date of recent fall   Hand Dominance Right    Prior Therapy PT OP @ [facility] addressing LUE shoulder and BLE      Precautions   Precautions Fall;Other (comment)   Rt breast CA     Balance Screen   Has the patient fallen in the past 6 months Yes    How many times? 1    Has the patient had a decrease in activity level because of a fear of falling?  No    Is the patient reluctant to leave their home because of a fear of falling?  No      Home  Environment   Family/patient expects to be discharged to: Private residence    Living Arrangements Alone    Available Help at Discharge Other (Comment)   pt reports she does not have any help at home   Type of [place] Access Level entry    Home Layout One level    Bathroom Shower/Tub Tub/Shower unit    [place] - 4 wheels;Shower seat;Cane -quad      Prior Function   Level of Independence Independent    Vocation Full time employment    Vocation Requirements work at a daycare    Leisure go to [employer], watch TV, cook      ADL   Eating/Feeding Independent    Grooming  Independent    Upper Body Bathing Modified independent   uses shower chair sometimes if not taking a bath   Lower Body Bathing  Modified independent    Upper Body Dressing Independent    Lower Body Dressing Independent    Toilet Transfer Independent    [place] Independent    Tub/Shower Transfer Modified independent   pt reports she gets in and out of the bathtub for a bath independently and in and out of the shower independently.   Tub/Shower Engineer, petroleum with back      IADL   Prior Level of Function Shopping independent prior    Shopping Takes care of all shopping needs independently    [place] alone or with occasional assistance    Meal Prep Plans, prepares and serves adequate meals independently    Programmer, applications own vehicle    Medication Management Takes responsibility if medication is prepared in advance in seperate dosage   daughter sorts them into pill box   Physiological scientist financial matters independently (budgets, writes checks, pays rent, bills goes to bank), collects and keeps track of income      Mobility   Mobility Status History of falls    Mobility Status Comments ambulates with rollator      Written Expression   Dominant Hand Right    Handwriting 100% legible   pt reports changes in the beginning but not anymore     Vision - History   Baseline Vision Wears glasses only for reading    Additional Comments pt denies any changes      Activity Tolerance   Activity Tolerance Tolerate 30+ min activity without fatigue   reports could take a walk for approx 30 minutes without rest break.     Cognition   Overall Cognitive Status No family/caregiver present to determine baseline cognitive functioning    Cognition Comments pt reports no changes but says her daughter reports changes      Sensation   Light Touch Appears Intact    Hot/Cold Appears  Intact      Coordination   9 Hole Peg Test Right;Left    Right 9 Hole Peg Test 24.87s    Left 9 Hole Peg Test 27.88s    Box and Blocks --      AROM   Right Shoulder Flexion 128 Degrees    Right Shoulder ABduction 115 Degrees    Left Shoulder Flexion 40 Degrees   place and hold   Left Shoulder ABduction 30 Degrees   place and hold     Hand Function   Right Hand Gross Grasp Functional    Right Hand Grip (lbs) 35.4    Left Hand Gross Grasp Functional    Left Hand Grip (lbs) 32.8                           OT Education - 08/01/20 1132    Education Details Education on role and purpose of OT    Person(s) Educated Patient    Methods Explanation    Comprehension Verbalized understanding;Need further instruction                      Plan - 08/01/20 1147    Clinical Impression Statement Pt is a 75 year old that presents to [facility] with ref diagnosis of spinal stenosis in the lumbar region. Pt has PMHx:, DM-2, CAD, Breast cancer in remission, anemia of renal disease, gout, DVT, Crohn's disease, pulm HTN, HTN, spinal stenosis, chronic pain.  Pt presents with significant limitations in LUE shoulder with AROM and strength being addressed by PT at [facility]. Pt with significant cognitive deficits and very poor awareness. Pt is being evaluated and treated by ST at this clinic.    OT Occupational Profile and History Problem Focused Assessment - Including review of records relating to presenting problem    Occupational performance deficits (Please refer to evaluation for details): ADL's;IADL's    Body Structure / Function / Physical Skills ADL;Pain;UE functional use;ROM;IADL;GMC;Strength    Cognitive Skills Attention;Memory;Safety Awareness;Understand;Consciousness;Sequencing    Rehab Potential Fair    Clinical Decision Making Limited treatment options, no task modification necessary    Comorbidities Affecting Occupational Performance: None    Modification  or Assistance to Complete Evaluation  No modification of tasks or assist necessary to complete eval    OT Frequency One time visit    OT Treatment/Interventions Self-care/ADL training;Patient/family education    Plan evaluation only. Shoulder being addressed by PT @ [facility] clinic.    Recommended Other Services Pt being seen by PT at [facility] at this clinic.    Consulted and Agree with Plan of Care Patient           Patient will benefit from skilled therapeutic intervention in order to improve the following deficits and impairments:   Body Structure / Function / Physical Skills: ADL,Pain,UE functional use,ROM,IADL,GMC,Strength Cognitive Skills: Attention,Memory,Safety Awareness,Understand,Consciousness,Sequencing     Visit Diagnosis: Acute pain of left shoulder    Problem List Patient Active Problem List   Diagnosis Date Noted  . CHF (congestive heart failure) ([place]) 07/20/2020  . Dyspnea 06/28/2020  . Chronic diarrhea - responds to budesonide 09/10/2019  . Iron deficiency anemia 08/19/2018  . Spondylosis of lumbar spine 12/24/2017  . Renal vein thrombosis ([place]) 09/17/2017  . Hoarse voice quality 09/09/2017  . Low back pain without sciatica 09/09/2017  . AKI (acute kidney injury) ([place]) 08/25/2017  . Labile blood glucose   . Sleep disturbance   . Leukocytosis   . Essential hypertension   . Urinary frequency   . Type 2 diabetes mellitus with peripheral neuropathy (H[facility])   . Benign essential HTN   . Stage 2 chronic kidney disease   . Dysphagia   . Debility   . Encephalopathy   . Pressure injury of skin 08/04/2017  . Pneumonia of left lung due to infectious organism   . Diffuse pulmonary alveolar hemorrhage   . SOB (shortness of breath) 10/11/2016  . Hyperparathyroidism, secondary renal ([place]) 05/02/2016  . Gout 04/11/2015  . Diabetic neuropathy associated with type 2 diabetes mellitus ([place]) 01/06/2015  . SBO (small bowel obstruction) ([place]) 09/15/2013  .  Breast cancer of lower-inner quadrant of right female breast ([place]) 03/18/2013  . Chronic venous insufficiency 01/06/2013  . Health care maintenance 05/08/2011  . Diabetes mellitus type 2 in obese ([place]) 05/03/2010  . Seasonal allergies 05/03/2010  . Labile hypertension   . CAD (coronary artery disease)   . Abdominal discomfort   . Anemia in chronic kidney disease   . History of stroke without residual deficits   . Hyperlipidemia associated with type 2 diabetes mellitus ([place])   . Chronic pain   . Chronic kidney disease, stage III (moderate) (HCC)   . Adnexal mass   . Spinal stenosis, lumbar region, with neurogenic claudication 01/03/2009    Zachery Conch MOT, OTR/L  08/01/2020, 12:07 PM  [facility] [address] [place], [place], [address] Phone:  [phone]   Fax:  [phone]  Name: Gracielynn [name] Gaumer MRN: [id number] Date of Birth: 04-Mar-1946

## 2020-08-02 ENCOUNTER — Other Ambulatory Visit: Payer: Self-pay

## 2020-08-02 ENCOUNTER — Telehealth: Payer: Self-pay

## 2020-08-02 DIAGNOSIS — E559 Vitamin D deficiency, unspecified: Secondary | ICD-10-CM

## 2020-08-02 MED ORDER — ERGOCALCIFEROL 1.25 MG (50000 UT) PO CAPS: 50000.0000 [IU] | ORAL_CAPSULE | ORAL | 1 refills | Status: DC

## 2020-08-02 NOTE — Telephone Encounter (Signed)
-----   Message from Truitt Merle, MD sent at 07/31/2020  7:13 PM EDT ----- Please let pt know her VitD level is very low, and I recommend vitD 50K weekly X8, then take OTC Vit D (double the dose she is on or at lease 2K daily), please call in 50K u Vitd prescription for her, thanks   Truitt Merle  07/31/2020

## 2020-08-02 NOTE — Telephone Encounter (Signed)
I spoke with [name] and reviewed Dr Ernestina Penna comments and recommendations.  She agrees to recommendations. Rx for Vit D 50K units sent to her pharmacy.

## 2020-08-03 ENCOUNTER — Ambulatory Visit: Payer: Medicare Other | Admitting: Physical Therapy

## 2020-08-03 ENCOUNTER — Other Ambulatory Visit: Payer: Self-pay | Admitting: General Surgery

## 2020-08-03 ENCOUNTER — Telehealth: Payer: Self-pay | Admitting: Physical Therapy

## 2020-08-03 NOTE — Telephone Encounter (Signed)
Patient "no show" for today's appointment. Left voicemail informing her of attendance policy and that if she has another no show or cancellation her appointments will be deleted and she will have to schedule one at a time.

## 2020-08-05 ENCOUNTER — Ambulatory Visit: Payer: Medicare Other | Admitting: Physical Therapy

## 2020-08-05 ENCOUNTER — Telehealth: Payer: Self-pay | Admitting: Physical Therapy

## 2020-08-05 NOTE — Telephone Encounter (Signed)
No show for appt today.  Left message on voicemail for pt to call facility.

## 2020-08-08 ENCOUNTER — Ambulatory Visit
Admission: RE | Admit: 2020-08-08 | Discharge: 2020-08-08 | Disposition: A | Discharge status: Home / Self Care | Payer: Medicare Other | Source: Ambulatory Visit | Attending: Hematology | Admitting: Hematology

## 2020-08-08 ENCOUNTER — Other Ambulatory Visit: Payer: Self-pay

## 2020-08-08 DIAGNOSIS — Z780 Asymptomatic menopausal state: Secondary | ICD-10-CM | POA: Diagnosis not present

## 2020-08-08 DIAGNOSIS — E2839 Other primary ovarian failure: Secondary | ICD-10-CM

## 2020-08-09 ENCOUNTER — Ambulatory Visit: Payer: Medicare Other | Admitting: Physical Therapy

## 2020-08-10 DIAGNOSIS — Z853 Personal history of malignant neoplasm of breast: Secondary | ICD-10-CM | POA: Diagnosis not present

## 2020-08-10 DIAGNOSIS — N183 Chronic kidney disease, stage 3 unspecified: Secondary | ICD-10-CM | POA: Diagnosis not present

## 2020-08-10 DIAGNOSIS — E7800 Pure hypercholesterolemia, unspecified: Secondary | ICD-10-CM | POA: Diagnosis not present

## 2020-08-10 DIAGNOSIS — D509 Iron deficiency anemia, unspecified: Secondary | ICD-10-CM | POA: Diagnosis not present

## 2020-08-10 DIAGNOSIS — I5033 Acute on chronic diastolic (congestive) heart failure: Secondary | ICD-10-CM | POA: Diagnosis not present

## 2020-08-10 DIAGNOSIS — E1122 Type 2 diabetes mellitus with diabetic chronic kidney disease: Secondary | ICD-10-CM | POA: Diagnosis not present

## 2020-08-10 DIAGNOSIS — I10 Essential (primary) hypertension: Secondary | ICD-10-CM | POA: Diagnosis not present

## 2020-08-10 DIAGNOSIS — N184 Chronic kidney disease, stage 4 (severe): Secondary | ICD-10-CM | POA: Diagnosis not present

## 2020-08-10 DIAGNOSIS — I2510 Atherosclerotic heart disease of native coronary artery without angina pectoris: Secondary | ICD-10-CM | POA: Diagnosis not present

## 2020-08-11 ENCOUNTER — Other Ambulatory Visit: Payer: Self-pay | Admitting: Internal Medicine

## 2020-08-11 ENCOUNTER — Ambulatory Visit: Payer: Medicare Other | Admitting: Physical Therapy

## 2020-08-11 ENCOUNTER — Telehealth: Payer: Self-pay

## 2020-08-11 DIAGNOSIS — I82451 Acute embolism and thrombosis of right peroneal vein: Secondary | ICD-10-CM | POA: Diagnosis not present

## 2020-08-11 DIAGNOSIS — E1122 Type 2 diabetes mellitus with diabetic chronic kidney disease: Secondary | ICD-10-CM | POA: Diagnosis not present

## 2020-08-11 DIAGNOSIS — I5032 Chronic diastolic (congestive) heart failure: Secondary | ICD-10-CM | POA: Diagnosis not present

## 2020-08-11 DIAGNOSIS — I129 Hypertensive chronic kidney disease with stage 1 through stage 4 chronic kidney disease, or unspecified chronic kidney disease: Secondary | ICD-10-CM | POA: Diagnosis not present

## 2020-08-11 DIAGNOSIS — N184 Chronic kidney disease, stage 4 (severe): Secondary | ICD-10-CM | POA: Diagnosis not present

## 2020-08-11 DIAGNOSIS — D631 Anemia in chronic kidney disease: Secondary | ICD-10-CM | POA: Diagnosis not present

## 2020-08-11 DIAGNOSIS — N189 Chronic kidney disease, unspecified: Secondary | ICD-10-CM | POA: Diagnosis not present

## 2020-08-11 DIAGNOSIS — M109 Gout, unspecified: Secondary | ICD-10-CM | POA: Diagnosis not present

## 2020-08-11 NOTE — Telephone Encounter (Signed)
-----   Message from Truitt Merle, MD sent at 08/10/2020  7:19 AM EDT ----- Please let her know DEXA was normal, good news. Thanks   Truitt Merle  08/10/2020

## 2020-08-11 NOTE — Telephone Encounter (Signed)
I spoke with [name] and relayed Dr Ernestina Penna comment.  She verbalized understanding.

## 2020-08-12 ENCOUNTER — Inpatient Hospital Stay: Payer: Medicare Other

## 2020-08-12 ENCOUNTER — Other Ambulatory Visit: Payer: Self-pay

## 2020-08-12 VITALS — BP 114/54 | HR 77 | Temp 98.9°F | Resp 18

## 2020-08-12 DIAGNOSIS — N184 Chronic kidney disease, stage 4 (severe): Secondary | ICD-10-CM | POA: Diagnosis not present

## 2020-08-12 DIAGNOSIS — D509 Iron deficiency anemia, unspecified: Secondary | ICD-10-CM

## 2020-08-12 DIAGNOSIS — D631 Anemia in chronic kidney disease: Secondary | ICD-10-CM | POA: Diagnosis not present

## 2020-08-12 DIAGNOSIS — E538 Deficiency of other specified B group vitamins: Secondary | ICD-10-CM | POA: Diagnosis not present

## 2020-08-12 LAB — CBC WITH DIFFERENTIAL (CANCER CENTER ONLY)
Abs Immature Granulocytes: 0.04 10*3/uL (ref 0.00–0.07)
Basophils Absolute: 0 10*3/uL (ref 0.0–0.1)
Basophils Relative: 0 %
Eosinophils Absolute: 0 10*3/uL (ref 0.0–0.5)
Eosinophils Relative: 0 %
HCT: 31.2 % — ABNORMAL LOW (ref 36.0–46.0)
Hemoglobin: 9.9 g/dL — ABNORMAL LOW (ref 12.0–15.0)
Immature Granulocytes: 0 %
Lymphocytes Relative: 20 %
Lymphs Abs: 1.9 10*3/uL (ref 0.7–4.0)
MCH: 27.7 pg (ref 26.0–34.0)
MCHC: 31.7 g/dL (ref 30.0–36.0)
MCV: 87.4 fL (ref 80.0–100.0)
Monocytes Absolute: 0.8 10*3/uL (ref 0.1–1.0)
Monocytes Relative: 9 %
Neutro Abs: 6.7 10*3/uL (ref 1.7–7.7)
Neutrophils Relative %: 71 %
Platelet Count: 285 10*3/uL (ref 150–400)
RBC: 3.57 MIL/uL — ABNORMAL LOW (ref 3.87–5.11)
RDW: 14.4 % (ref 11.5–15.5)
WBC Count: 9.5 10*3/uL (ref 4.0–10.5)
nRBC: 0 % (ref 0.0–0.2)

## 2020-08-12 MED ORDER — EPOETIN ALFA-EPBX 40000 UNIT/ML IJ SOLN
INTRAMUSCULAR | Status: AC
  Filled 2020-08-12: qty 1

## 2020-08-12 MED ORDER — EPOETIN ALFA-EPBX 40000 UNIT/ML IJ SOLN
40000.0000 [IU] | Freq: Once | INTRAMUSCULAR | Status: AC
  Administered 2020-08-12: 40000 [IU] via SUBCUTANEOUS

## 2020-08-12 NOTE — Patient Instructions (Signed)

## 2020-08-16 ENCOUNTER — Encounter: Payer: Medicare Other | Admitting: Physical Therapy

## 2020-08-17 ENCOUNTER — Emergency Department (HOSPITAL_COMMUNITY)
Admission: EM | Admit: 2020-08-17 | Discharge: 2020-08-17 | Disposition: A | Discharge status: Home / Self Care | Payer: Medicare Other | Attending: Emergency Medicine | Admitting: Emergency Medicine

## 2020-08-17 ENCOUNTER — Other Ambulatory Visit: Payer: Self-pay

## 2020-08-17 ENCOUNTER — Ambulatory Visit: Payer: Medicare Other

## 2020-08-17 ENCOUNTER — Other Ambulatory Visit: Payer: Medicare Other

## 2020-08-17 ENCOUNTER — Emergency Department (HOSPITAL_COMMUNITY): Payer: Medicare Other

## 2020-08-17 DIAGNOSIS — I509 Heart failure, unspecified: Secondary | ICD-10-CM | POA: Diagnosis not present

## 2020-08-17 DIAGNOSIS — R112 Nausea with vomiting, unspecified: Secondary | ICD-10-CM | POA: Diagnosis not present

## 2020-08-17 DIAGNOSIS — N183 Chronic kidney disease, stage 3 unspecified: Secondary | ICD-10-CM | POA: Diagnosis not present

## 2020-08-17 DIAGNOSIS — Z7901 Long term (current) use of anticoagulants: Secondary | ICD-10-CM | POA: Insufficient documentation

## 2020-08-17 DIAGNOSIS — E1140 Type 2 diabetes mellitus with diabetic neuropathy, unspecified: Secondary | ICD-10-CM | POA: Diagnosis not present

## 2020-08-17 DIAGNOSIS — Z853 Personal history of malignant neoplasm of breast: Secondary | ICD-10-CM | POA: Insufficient documentation

## 2020-08-17 DIAGNOSIS — Z79899 Other long term (current) drug therapy: Secondary | ICD-10-CM | POA: Insufficient documentation

## 2020-08-17 DIAGNOSIS — D631 Anemia in chronic kidney disease: Secondary | ICD-10-CM | POA: Insufficient documentation

## 2020-08-17 DIAGNOSIS — Z7984 Long term (current) use of oral hypoglycemic drugs: Secondary | ICD-10-CM | POA: Insufficient documentation

## 2020-08-17 DIAGNOSIS — K6389 Other specified diseases of intestine: Secondary | ICD-10-CM | POA: Diagnosis not present

## 2020-08-17 DIAGNOSIS — R109 Unspecified abdominal pain: Secondary | ICD-10-CM | POA: Diagnosis not present

## 2020-08-17 DIAGNOSIS — Z20822 Contact with and (suspected) exposure to covid-19: Secondary | ICD-10-CM | POA: Diagnosis not present

## 2020-08-17 DIAGNOSIS — I2510 Atherosclerotic heart disease of native coronary artery without angina pectoris: Secondary | ICD-10-CM | POA: Insufficient documentation

## 2020-08-17 DIAGNOSIS — K449 Diaphragmatic hernia without obstruction or gangrene: Secondary | ICD-10-CM | POA: Diagnosis not present

## 2020-08-17 DIAGNOSIS — R110 Nausea: Secondary | ICD-10-CM

## 2020-08-17 DIAGNOSIS — I130 Hypertensive heart and chronic kidney disease with heart failure and stage 1 through stage 4 chronic kidney disease, or unspecified chronic kidney disease: Secondary | ICD-10-CM | POA: Insufficient documentation

## 2020-08-17 DIAGNOSIS — I700 Atherosclerosis of aorta: Secondary | ICD-10-CM | POA: Diagnosis not present

## 2020-08-17 LAB — COMPREHENSIVE METABOLIC PANEL
ALT: 16 U/L (ref 0–44)
AST: 20 U/L (ref 15–41)
Albumin: 3.4 g/dL — ABNORMAL LOW (ref 3.5–5.0)
Alkaline Phosphatase: 58 U/L (ref 38–126)
Anion gap: 10 (ref 5–15)
BUN: 52 mg/dL — ABNORMAL HIGH (ref 8–23)
CO2: 21 mmol/L — ABNORMAL LOW (ref 22–32)
Calcium: 9.4 mg/dL (ref 8.9–10.3)
Chloride: 106 mmol/L (ref 98–111)
Creatinine, Ser: 2.42 mg/dL — ABNORMAL HIGH (ref 0.44–1.00)
GFR, Estimated: 21 mL/min — ABNORMAL LOW (ref >60)
Glucose, Bld: 121 mg/dL — ABNORMAL HIGH (ref 70–99)
Potassium: 3.7 mmol/L (ref 3.5–5.1)
Sodium: 137 mmol/L (ref 135–145)
Total Bilirubin: 0.4 mg/dL (ref 0.3–1.2)
Total Protein: 7.2 g/dL (ref 6.5–8.1)

## 2020-08-17 LAB — CBC
HCT: 37 % (ref 36.0–46.0)
Hemoglobin: 11.1 g/dL — ABNORMAL LOW (ref 12.0–15.0)
MCH: 27.2 pg (ref 26.0–34.0)
MCHC: 30 g/dL (ref 30.0–36.0)
MCV: 90.7 fL (ref 80.0–100.0)
Platelets: 323 10*3/uL (ref 150–400)
RBC: 4.08 MIL/uL (ref 3.87–5.11)
RDW: 14.2 % (ref 11.5–15.5)
WBC: 9.5 10*3/uL (ref 4.0–10.5)
nRBC: 0.3 % — ABNORMAL HIGH (ref 0.0–0.2)

## 2020-08-17 LAB — URINALYSIS, ROUTINE W REFLEX MICROSCOPIC
Bilirubin Urine: NEGATIVE
Glucose, UA: NEGATIVE mg/dL
Hgb urine dipstick: NEGATIVE
Ketones, ur: NEGATIVE mg/dL
Leukocytes,Ua: NEGATIVE
Nitrite: NEGATIVE
Protein, ur: 30 mg/dL — AB
Specific Gravity, Urine: 1.014 (ref 1.005–1.030)
pH: 5 (ref 5.0–8.0)

## 2020-08-17 LAB — LIPASE, BLOOD: Lipase: 44 U/L (ref 11–51)

## 2020-08-17 LAB — RESP PANEL BY RT-PCR (FLU A&B, COVID) ARPGX2
Influenza A by PCR: NEGATIVE
Influenza B by PCR: NEGATIVE
SARS Coronavirus 2 by RT PCR: NEGATIVE

## 2020-08-17 MED ORDER — LIDOCAINE VISCOUS HCL 2 % MT SOLN
15.0000 mL | Freq: Once | OROMUCOSAL | Status: AC
  Administered 2020-08-17: 15 mL via ORAL
  Filled 2020-08-17: qty 15

## 2020-08-17 MED ORDER — ONDANSETRON 4 MG PO TBDP: 4.0000 mg | ORAL_TABLET | Freq: Three times a day (TID) | ORAL | 0 refills | Status: DC | PRN

## 2020-08-17 MED ORDER — SODIUM CHLORIDE 0.9 % IV BOLUS
500.0000 mL | Freq: Once | INTRAVENOUS | Status: AC
  Administered 2020-08-17: 500 mL via INTRAVENOUS

## 2020-08-17 MED ORDER — ALUM & MAG HYDROXIDE-SIMETH 200-200-20 MG/5ML PO SUSP
30.0000 mL | Freq: Once | ORAL | Status: AC
  Administered 2020-08-17: 30 mL via ORAL
  Filled 2020-08-17: qty 30

## 2020-08-17 NOTE — ED Provider Notes (Signed)
[facility] EMERGENCY DEPARTMENT Provider Note   CSN: [id number] Arrival date & time: 08/17/20  1138     History Chief Complaint  Patient presents with  . Nausea    [name] is a 75 y.o. female with past medical history significant for CKD stage IV, chronic diastolic CHF, type 2 diabetes, CAD, left lower extremity DVT on Eliquis, pulmonary hypertension, hypertension, gout, Crohn's disease, chronic iron deficiency anemia.  HPI Patient presents to emergency department today with chief complaint of nausea.  She states this has been progressively worsening x3 weeks.  Patient states she has a history of bowel obstructions and is concerned she has one now.  Her granddaughter is at the bedside and contributes to history.  Patient states she has been unable to tolerate anything except sips of water since symptom onset.  She is also endorsing intermittent sharp abdominal pain.  When present pain is 6 out of 10 in severity.  She denies any pain at this moment.  Pain does not radiate to her back or groin.  Patient has been unable to tolerate home medications for the last several days which include her Eliquis.  She states her last bowel movement was x4 days ago.  She describes it as loose brown stool.  Denies any blood in stool.  She denies passing flatus.  Also denies any fever, chills, shortness of breath or chest pain, urinary symptoms.  Abdominal surgical history includes abdominal hysterectomy, cholecystectomy, ventral hernia repair, right hemicolectomy.   Past Medical History:  Diagnosis Date  . Abdominal discomfort    Chronic N/V/D. Presumptive dx Crohn's dx per elevated p ANCA. Failed Entocort and Pentasa. Sep 2003 - ileocolectomy c anastomosis per Dr Deon Pilling 2/2 adhesions - path was hegative for Crohns. EGD, Sm bowel follow through (11/03), and an eteroclysis (10/03) were unrevealing. Cuases hypomag and hypocalcemia.  . Adnexal mass 8/03   s/p lap BSO (R ovarian  fibroma) & lysis of adhesions  . Allergy    Seasonal  . Anemia    Multifactorial. Baseline HgB 10-11 ish. B12 def - 150 in 3/10. Fe Def - ferritin 35 3/10. Both are being repleted.  . Breast cancer ([place]) 03/16/13   right, 5 o'clock  . CAD (coronary artery disease) 1996   1996 - PTCA and angioplasty diagonal branch. 2000 - Rotoblator & angiopllasty of diagonal. 2006 - subendocardial AMI, DES to proximal LAD.Marland Kitchen Also had 90% stenosis in distal apical LAD. EF 55 with apical hypokinesis. Indefinite ASA and Plavix.  . CHF (congestive heart failure) ([place])   . Chronic kidney disease    Chronic renal insuff baseline Cr 1.2 - 1.4 ish.  . Chronic pain    CT 10/10 = Spinal stenosis L2 - S1.  . Diabetes mellitus    Insulin dependent  . Gout   . Hx of radiation therapy 06/02/13- 07/16/13   right rbeast 4500 cGy 25 sessions, right breast boost 1600 cGy in 8 sessions  . Hyperlipidemia    Managed with both a statin and Welchol. Welchol stopped 2014 2/2 cost and started on fenofibrate   . Hypertension    2006 B renal arteries patent. 2003 MRA - no RAS. 2003 pheo W/U Dr Hassell Done reportedly negative.  . Hypoxia 07/23/2017  . Lupus ([place])   . Lymphedema of breast   . Personal history of radiation therapy 2015  . RBBB   . Renal vein thrombosis ([place])   . SBO (small bowel obstruction) ([place]) 09/17/2017  . Secondary hyperparathyroidism ([place])   .  Stroke [facility])    Incidental finding MRI 2002 L lacunar infarct  . Vitamin B12 deficiency   . Vitamin D deficiency   . Wears dentures    top    Patient Active Problem List   Diagnosis Date Noted  . CHF (congestive heart failure) ([place]) 07/20/2020  . Dyspnea 06/28/2020  . Chronic diarrhea - responds to budesonide 09/10/2019  . Iron deficiency anemia 08/19/2018  . Spondylosis of lumbar spine 12/24/2017  . Renal vein thrombosis ([place]) 09/17/2017  . Hoarse voice quality 09/09/2017  . Low back pain without sciatica 09/09/2017  . AKI (acute kidney injury) ([place])  08/25/2017  . Labile blood glucose   . Sleep disturbance   . Leukocytosis   . Essential hypertension   . Urinary frequency   . Type 2 diabetes mellitus with peripheral neuropathy (HCC)   . Benign essential HTN   . Stage 2 chronic kidney disease   . Dysphagia   . Debility   . Encephalopathy   . Pressure injury of skin 08/04/2017  . Pneumonia of left lung due to infectious organism   . Diffuse pulmonary alveolar hemorrhage   . SOB (shortness of breath) 10/11/2016  . Hyperparathyroidism, secondary renal ([place]) 05/02/2016  . Gout 04/11/2015  . Diabetic neuropathy associated with type 2 diabetes mellitus ([place]) 01/06/2015  . SBO (small bowel obstruction) ([place]) 09/15/2013  . Breast cancer of lower-inner quadrant of right female breast ([place]) 03/18/2013  . Chronic venous insufficiency 01/06/2013  . Health care maintenance 05/08/2011  . Diabetes mellitus type 2 in obese ([place]) 05/03/2010  . Seasonal allergies 05/03/2010  . Labile hypertension   . CAD (coronary artery disease)   . Abdominal discomfort   . Anemia in chronic kidney disease   . History of stroke without residual deficits   . Hyperlipidemia associated with type 2 diabetes mellitus ([place])   . Chronic pain   . Chronic kidney disease, stage III (moderate) (HCC)   . Adnexal mass   . Spinal stenosis, lumbar region, with neurogenic claudication 01/03/2009    Past Surgical History:  Procedure Laterality Date  . ABDOMINAL HYSTERECTOMY    . BILATERAL SALPINGOOPHORECTOMY  8/03   Lap BSO (R ovarian fibroma) and adhesion lysis  . BOWEL RESECTION  2003   ileocolectomy with anastomosis 2/2 adhesions  . BREAST BIOPSY Right 2015  . BREAST BIOPSY Right 07/2014  . BREAST LUMPECTOMY Right 04/22/2013  . BREAST LUMPECTOMY WITH NEEDLE LOCALIZATION AND AXILLARY SENTINEL LYMPH NODE BX Right 04/22/2013   Procedure: BREAST LUMPECTOMY WITH NEEDLE LOCALIZATION AND AXILLARY SENTINEL LYMPH NODE BX;  Surgeon: Stark Klein, MD;  Location: [facility];  Service: General;  Laterality: Right;  . CARDIAC CATHETERIZATION     2 stents  . CHOLECYSTECTOMY    . COLONOSCOPY    . HEMICOLECTOMY     R sided hemicolectomy  . HERNIA REPAIR     Ventral hernia repair  . PTCA  4/06     OB History   No obstetric history on file.     Family History  Problem Relation Age of Onset  . Pancreatic cancer Brother 12  . Breast cancer Mother 65  . Lung cancer Maternal Aunt   . Breast cancer Maternal Aunt   . Prostate cancer Maternal Uncle   . Heart attack Maternal Grandmother   . Breast cancer Maternal Grandmother   . Colon cancer Maternal Grandmother   . Clotting disorder Maternal Grandmother   . Diabetes Maternal Grandmother   . Kidney failure  Maternal Aunt   . Hypertension Daughter     Social History   Tobacco Use  . Smoking status: Never Smoker  . Smokeless tobacco: Never Used  Vaping Use  . Vaping Use: Never used  Substance Use Topics  . Alcohol use: No    Alcohol/week: 0.0 standard drinks  . Drug use: No    Home Medications Prior to Admission medications   Medication Sig Start Date End Date Taking? Authorizing Provider  allopurinol (ZYLOPRIM) 100 MG tablet Take 0.5 tablets (50 mg total) by mouth every other day. 07/01/20  Yes Mercy Riding, MD  amLODipine (NORVASC) 10 MG tablet Take 1 tablet (10 mg total) by mouth daily. 08/22/17  Yes Angiulli, Lavon Paganini, PA-C  apixaban (ELIQUIS) 5 MG TABS tablet Take 1 tablet (5 mg total) by mouth 2 (two) times daily. Start after you finish the starter pack 07/24/20  Yes Vann, Jessica U, DO  budesonide (ENTOCORT EC) 3 MG 24 hr capsule Take 1 capsule by mouth once daily Patient taking differently: Take 3 mg by mouth daily. 04/06/20  Yes Gatha Mayer, MD  Cyanocobalamin (B-12 COMPLIANCE INJECTION IJ) Inject 1 vial as directed every 30 (thirty) days.   Yes [provider]  Epoetin Alfa-epbx (RETACRIT IJ) Inject 1 vial as directed every 30 (thirty) days.   Yes [provider]  ergocalciferol (VITAMIN D2) 1.25 MG (50000 UT) capsule Take 1 capsule (50,000 Units total) by mouth once a week. 08/02/20  Yes Truitt Merle, MD  ferrous fumarate (HEMOCYTE - 106 MG FE) 325 (106 Fe) MG TABS tablet Take 325 mg of iron by mouth daily.   Yes [provider]  hydrALAZINE (APRESOLINE) 100 MG tablet TAKE 1 TABLET BY MOUTH 3  TIMES DAILY Patient taking differently: Take 100 mg by mouth 3 (three) times daily. 01/20/20  Yes Josue Hector, MD  HYDROmorphone (DILAUDID) 2 MG tablet Take 1 tablet (2 mg total) by mouth every 6 (six) hours as needed for severe pain. 07/14/20  Yes Corena Herter, PA-C  isosorbide mononitrate (IMDUR) 30 MG 24 hr tablet TAKE 1/2 (ONE-HALF) TABLET BY MOUTH ONCE DAILY (PLEASE  SCHEDULE  AN  APPOINTMENT  FOR  FURTHER  REFILLS) Patient taking differently: Take 15 mg by mouth daily. 07/12/20  Yes Isaiah Serge, NP  linagliptin (TRADJENTA) 5 MG TABS tablet Take 5 mg by mouth daily.   Yes [provider]  Melatonin 5 MG TABS Take 10 mg by mouth at bedtime.   Yes [provider]  nebivolol (BYSTOLIC) 10 MG tablet Take 1 tablet (10 mg total) by mouth daily. 06/30/20  Yes Mercy Riding, MD  nitroGLYCERIN (NITROSTAT) 0.4 MG SL tablet DISSOLVE 1 TABLET UNDER THE TONGUE EVERY 5 MINUTES AS  NEEDED FOR CHEST PAIN. MAX  OF 3 TABLETS IN 15 MINUTES. CALL 911 IF PAIN PERSISTS. Patient taking differently: Place 0.4 mg under the tongue every 5 (five) minutes as needed for chest pain. 10/28/19  Yes Josue Hector, MD  ondansetron (ZOFRAN ODT) 4 MG disintegrating tablet Take 1 tablet (4 mg total) by mouth every 8 (eight) hours as needed for nausea or vomiting. 08/17/20  Yes Walisiewicz, Taletha Twiford E, PA-C  pantoprazole (PROTONIX) 40 MG tablet TAKE 1 TABLET BY MOUTH  DAILY BEFORE BREAKFAST Patient taking differently: Take 40 mg by mouth daily. 08/11/20  Yes Gatha Mayer, MD  pregabalin (LYRICA) 25 MG capsule Take 1 capsule (25 mg total) by mouth 2 (two)  times daily. 07/19/20  Yes Kirsteins, Luanna Salk, MD  rosuvastatin (CRESTOR) 5 MG tablet Take 5 mg by mouth daily.   Yes [provider]  torsemide (DEMADEX) 20 MG tablet Take 2 tablets (40 mg total) by mouth daily. 07/11/20  Yes Josue Hector, MD    Allergies    Percocet [oxycodone-acetaminophen]; Diazepam; Haloperidol lactate; Lorazepam; Morphine sulfate; Propoxyphene hcl; Tramadol hcl; Anesthetics, halogenated; Etomidate; Fentanyl; Haloperidol; Morphine sulfate; Oxycodone-acetaminophen; Propoxyphene; Tramadol hcl; and Versed [midazolam]  Review of Systems   Review of Systems All other systems are reviewed and are negative for acute change except as noted in the HPI.  Physical Exam Updated Vital Signs BP 137/67 (BP Location: Right Arm)   Pulse 70   Temp 98.6 F (37 C)   Resp 15   SpO2 100%   Physical Exam Vitals and nursing note reviewed.  Constitutional:      General: She is not in acute distress.    Appearance: She is not ill-appearing.  HENT:     Head: Normocephalic and atraumatic.     Right Ear: Tympanic membrane and external ear normal.     Left Ear: Tympanic membrane and external ear normal.     Nose: Nose normal.     Mouth/Throat:     Mouth: Mucous membranes are dry.     Pharynx: Oropharynx is clear.  Eyes:     General: No scleral icterus.       Right eye: No discharge.        Left eye: No discharge.     Extraocular Movements: Extraocular movements intact.     Conjunctiva/sclera: Conjunctivae normal.     Pupils: Pupils are equal, round, and reactive to light.  Neck:     Vascular: No JVD.  Cardiovascular:     Rate and Rhythm: Normal rate and regular rhythm.     Pulses: Normal pulses.          Radial pulses are 2+ on the right side and 2+ on the left side.     Heart sounds: Normal heart sounds.  Pulmonary:     Comments: Lungs clear to auscultation in all fields. Symmetric chest rise. No wheezing, rales, or rhonchi. Abdominal:     Tenderness: There is no  right CVA tenderness or left CVA tenderness.     Comments: Abdomen is soft, non-distended, and non-tender in all quadrants. No rigidity, no guarding. No peritoneal signs.  Musculoskeletal:        General: Normal range of motion.     Cervical back: Normal range of motion.  Skin:    General: Skin is warm and dry.     Capillary Refill: Capillary refill takes less than 2 seconds.  Neurological:     Mental Status: She is oriented to person, place, and time.     GCS: GCS eye subscore is 4. GCS verbal subscore is 5. GCS motor subscore is 6.     Comments: Fluent speech, no facial droop.  Psychiatric:        Behavior: Behavior normal.     ED Results / Procedures / Treatments   Labs (all labs ordered are listed, but only abnormal results are displayed) Labs Reviewed  COMPREHENSIVE METABOLIC PANEL - Abnormal; Notable for the following components:      Result Value   CO2 21 (*)    Glucose, Bld 121 (*)    BUN 52 (*)    Creatinine, Ser 2.42 (*)    Albumin 3.4 (*)    GFR, Estimated 21 (*)  All other components within normal limits  CBC - Abnormal; Notable for the following components:   Hemoglobin 11.1 (*)    nRBC 0.3 (*)    All other components within normal limits  URINALYSIS, ROUTINE W REFLEX MICROSCOPIC - Abnormal; Notable for the following components:   APPearance HAZY (*)    Protein, ur 30 (*)    Bacteria, UA RARE (*)    All other components within normal limits  RESP PANEL BY RT-PCR (FLU A&B, COVID) ARPGX2  URINE CULTURE  LIPASE, BLOOD    EKG None  Radiology CT ABDOMEN PELVIS WO CONTRAST  Result Date: 08/17/2020 CLINICAL DATA:  Nausea, history of bowel obstructions EXAM: CT ABDOMEN AND PELVIS WITHOUT CONTRAST TECHNIQUE: Multidetector CT imaging of the abdomen and pelvis was performed following the standard protocol without IV contrast. COMPARISON:  July 2020 FINDINGS: Lower chest: Lung bases are clear. There are partially imaged postoperative and probably radiation changes  of the right breast. Hepatobiliary: No focal liver abnormality is seen. Status post cholecystectomy. No biliary dilatation. Pancreas: Unremarkable. Spleen: Unremarkable. Adrenals/Urinary Tract: Adrenals, kidneys, and partially distended bladder are unremarkable. Stomach/Bowel: A small hiatal hernia is present. Stomach is otherwise unremarkable. Bowel is normal in caliber. There are no dilated loops of small bowel or air-fluid levels. There are postsurgical changes of the right colon with an ileocolic anastomosis. Several small bowel loops are closely apposed to the lower abdominal wall ventrally likely reflecting adhesions. Vascular/Lymphatic: Aortic atherosclerosis. No enlarged lymph nodes. Reproductive: Status post hysterectomy. No adnexal masses. Other: No free fluid. Abdominal wall is unremarkable apart from postsurgical changes. Musculoskeletal: Increased density of osseous structures likely reflecting renal osteodystrophy. No acute osseous abnormality. IMPRESSION: No evidence of bowel obstruction. Probable adhesions along the ventral abdominal wall. Small hiatal hernia. Aortic atherosclerosis. Probable renal osteodystrophy. Electronically Signed   By: Macy Mis M.D.   On: 08/17/2020 14:20    Procedures Procedures   Medications Ordered in ED Medications  sodium chloride 0.9 % bolus 500 mL (500 mLs Intravenous New Bag/Given 08/17/20 1332)  alum & mag hydroxide-simeth (MAALOX/MYLANTA) 200-200-20 MG/5ML suspension 30 mL (30 mLs Oral Given 08/17/20 1529)    And  lidocaine (XYLOCAINE) 2 % viscous mouth solution 15 mL (15 mLs Oral Given 08/17/20 1529)    ED Course  I have reviewed the triage vital signs and the nursing notes.  Pertinent labs & imaging results that were available during my care of the patient were reviewed by me and considered in my medical decision making (see chart for details).    MDM Rules/Calculators/A&P                          History provided by patient and granddaughter  with additional history obtained from chart review.    Presenting with abdominal pain and nausea x3 weeks.  Patient is nontoxic-appearing.  She does have dry mucous membranes.  I do appreciate hypoactive bowel sounds in all quadrants.  She has no abdominal tenderness.  She declines need for any antiemetic or analgesic after my initial exam.  Labs were ordered in triage, viewed results.  CBC without leukocytosis, hemoglobin consistent with baseline.  CMP with no significant electrolyte derangement, renal function consistent with baseline.  No anion gap.  Lipase is within normal range. COVID test negative.  CT abdomen pelvis performed without contrast due to national IV contrast shortage as well as patient's GFR of 21. Imaging shows no obvious bowel obstruction.  Urologist comments on probable  adhesions on ventral abdominal wall.  I discussed results with patient and her granddaughters at the bedside. UA without obvious infection, does have rare bacteria.  There is noted to be squamous epithelial cells as well as mucus and budding yeast.  Discussed results with patient.  She is denying any vaginal discharge or pruritus.  Urine culture sent.  We will hold off on any interventions at this time as patient is seemingly asymptomatic for urinary standpoint.  She is agreeable to try GI cocktail for symptoms.  She is tolerating p.o. intake here.  Serial abdominal exams are benign.  Will discharge home with prescription for Zofran.  Patient will need close follow-up with her PCP for recheck.  Discussed strict return precautions.   Discussed HPI, physical exam and plan of care for this patient with attending Dr. Francia Greaves. The attending physician evaluated this patient as part of a shared visit and agrees with plan of care.   Portions of this note were generated with [employer]. Dictation errors may occur despite best attempts at proofreading.   Final Clinical Impression(s) / ED Diagnoses Final  diagnoses:  Nausea    Rx / DC Orders ED Discharge Orders         Ordered    ondansetron (ZOFRAN ODT) 4 MG disintegrating tablet  Every 8 hours PRN        08/17/20 1454           Lewanda Rife 08/17/20 1530    Valarie Merino, MD 08/17/20 1845

## 2020-08-17 NOTE — Discharge Instructions (Addendum)
-  CT scan did not show any signs of an obstruction.  Prescription for Zofran sent to your pharmacy.  Take as needed for nausea.  Follow-up with PCP for recheck as soon as possible.  Return to the ER for any new or worsening symptoms.

## 2020-08-17 NOTE — ED Triage Notes (Signed)
Pt with hx of multiple bowel obstructions presents for eval of nausea x 3 weeks. Unable to tolerate PO intake at all, so has not been eating, drinking, or taking her medications. Last BM Saturday, but endorses intermittent diarrhea over the last three weeks. Denies abdominal pain.

## 2020-08-18 ENCOUNTER — Encounter: Payer: Medicare Other | Admitting: Physical Therapy

## 2020-08-18 LAB — URINE CULTURE: Culture: >=100000 — AB

## 2020-08-19 ENCOUNTER — Ambulatory Visit: Payer: Medicare Other | Admitting: Physical Medicine & Rehabilitation

## 2020-08-19 ENCOUNTER — Telehealth: Payer: Self-pay | Admitting: *Deleted

## 2020-08-19 NOTE — Telephone Encounter (Signed)
Post ED Visit - Positive Culture Follow-up  Culture report reviewed by antimicrobial stewardship pharmacist: [facility] Team []  Elenor Quinones, Pharm.D. []  Heide Guile, Pharm.D., BCPS AQ-ID []  Parks Neptune, Pharm.D., BCPS []  Alycia Rossetti, Pharm.D., BCPS []  [place], Pharm.D., BCPS, AAHIVP []  Legrand Como, Pharm.D., BCPS, AAHIVP []  Salome Arnt, PharmD, BCPS []  Johnnette Gourd, PharmD, BCPS []  Hughes Better, PharmD, BCPS []  Leeroy Cha, PharmD []  Laqueta Linden, PharmD, BCPS []  Albertina Parr, PharmD  [place] Team []  Leodis Sias, PharmD []  Lindell Spar, PharmD []  Royetta Asal, PharmD []  Graylin Shiver, Rph []  Rema Fendt) Glennon Mac, PharmD []  Arlyn Dunning, PharmD []  Netta Cedars, PharmD []  Dia Sitter, PharmD []  Leone Haven, PharmD []  Gretta Arab, PharmD []  Theodis Shove, PharmD []  Peggyann Juba, PharmD []  Reuel Boom, PharmD   Positive urine culture Asymptomatic from urinary standpoint and no further patient follow-up is required at this time. Joetta Manners, PharmD Harlon Flor Talley 08/19/2020, 11:53 AM

## 2020-08-23 ENCOUNTER — Encounter: Payer: Medicare Other | Admitting: Physical Therapy

## 2020-08-23 DIAGNOSIS — R634 Abnormal weight loss: Secondary | ICD-10-CM | POA: Diagnosis not present

## 2020-08-23 DIAGNOSIS — R1013 Epigastric pain: Secondary | ICD-10-CM | POA: Diagnosis not present

## 2020-08-23 DIAGNOSIS — R110 Nausea: Secondary | ICD-10-CM | POA: Diagnosis not present

## 2020-08-24 DIAGNOSIS — R8290 Unspecified abnormal findings in urine: Secondary | ICD-10-CM | POA: Diagnosis not present

## 2020-08-25 ENCOUNTER — Encounter: Payer: Medicare Other | Admitting: Physical Therapy

## 2020-08-26 ENCOUNTER — Other Ambulatory Visit: Payer: Self-pay

## 2020-08-26 ENCOUNTER — Inpatient Hospital Stay: Payer: Medicare Other | Attending: Hematology

## 2020-08-26 ENCOUNTER — Inpatient Hospital Stay: Payer: Medicare Other

## 2020-08-26 VITALS — BP 121/69 | HR 74 | Temp 98.7°F | Resp 16

## 2020-08-26 DIAGNOSIS — E538 Deficiency of other specified B group vitamins: Secondary | ICD-10-CM | POA: Insufficient documentation

## 2020-08-26 DIAGNOSIS — D631 Anemia in chronic kidney disease: Secondary | ICD-10-CM | POA: Insufficient documentation

## 2020-08-26 DIAGNOSIS — D509 Iron deficiency anemia, unspecified: Secondary | ICD-10-CM

## 2020-08-26 DIAGNOSIS — D649 Anemia, unspecified: Secondary | ICD-10-CM

## 2020-08-26 DIAGNOSIS — N184 Chronic kidney disease, stage 4 (severe): Secondary | ICD-10-CM | POA: Insufficient documentation

## 2020-08-26 LAB — CBC WITH DIFFERENTIAL (CANCER CENTER ONLY)
Abs Immature Granulocytes: 0.05 10*3/uL (ref 0.00–0.07)
Basophils Absolute: 0 10*3/uL (ref 0.0–0.1)
Basophils Relative: 0 %
Eosinophils Absolute: 0 10*3/uL (ref 0.0–0.5)
Eosinophils Relative: 0 %
HCT: 34.2 % — ABNORMAL LOW (ref 36.0–46.0)
Hemoglobin: 10.9 g/dL — ABNORMAL LOW (ref 12.0–15.0)
Immature Granulocytes: 1 %
Lymphocytes Relative: 16 %
Lymphs Abs: 1.7 10*3/uL (ref 0.7–4.0)
MCH: 27.3 pg (ref 26.0–34.0)
MCHC: 31.9 g/dL (ref 30.0–36.0)
MCV: 85.5 fL (ref 80.0–100.0)
Monocytes Absolute: 0.7 10*3/uL (ref 0.1–1.0)
Monocytes Relative: 7 %
Neutro Abs: 8.1 10*3/uL — ABNORMAL HIGH (ref 1.7–7.7)
Neutrophils Relative %: 76 %
Platelet Count: 251 10*3/uL (ref 150–400)
RBC: 4 MIL/uL (ref 3.87–5.11)
RDW: 14.5 % (ref 11.5–15.5)
WBC Count: 10.6 10*3/uL — ABNORMAL HIGH (ref 4.0–10.5)
nRBC: 0 % (ref 0.0–0.2)

## 2020-08-26 LAB — VITAMIN B12: Vitamin B-12: 2136 pg/mL — ABNORMAL HIGH (ref 180–914)

## 2020-08-26 LAB — FERRITIN: Ferritin: 89 ng/mL (ref 11–307)

## 2020-08-26 MED ORDER — CYANOCOBALAMIN 1000 MCG/ML IJ SOLN
1000.0000 ug | Freq: Once | INTRAMUSCULAR | Status: AC
  Administered 2020-08-26: 1000 ug via INTRAMUSCULAR

## 2020-08-26 MED ORDER — CYANOCOBALAMIN 1000 MCG/ML IJ SOLN
INTRAMUSCULAR | Status: AC
  Filled 2020-08-26: qty 1

## 2020-08-26 MED ORDER — EPOETIN ALFA-EPBX 40000 UNIT/ML IJ SOLN
INTRAMUSCULAR | Status: AC
  Filled 2020-08-26: qty 1

## 2020-08-26 MED ORDER — EPOETIN ALFA-EPBX 40000 UNIT/ML IJ SOLN
40000.0000 [IU] | Freq: Once | INTRAMUSCULAR | Status: AC
  Administered 2020-08-26: 40000 [IU] via SUBCUTANEOUS

## 2020-08-26 NOTE — Patient Instructions (Addendum)
Epoetin Alfa injection What is this medication? EPOETIN ALFA (e POE e tin AL fa) helps your body make more red blood cells. This medicine is used to treat anemia caused by chronic kidney disease, cancer chemotherapy, or HIV-therapy. It may also be used before surgery if you have anemia. This medicine may be used for other purposes; ask your health care provider or pharmacist if you have questions. COMMON BRAND NAME(S): Epogen, Procrit, Retacrit What should I tell my care team before I take this medication? They need to know if you have any of these conditions: cancer heart disease high blood pressure history of blood clots history of stroke low levels of folate, iron, or vitamin B12 in the blood seizures an unusual or allergic reaction to erythropoietin, albumin, benzyl alcohol, hamster proteins, other medicines, foods, dyes, or preservatives pregnant or trying to get pregnant breast-feeding How should I use this medication? This medicine is for injection into a vein or under the skin. It is usually given by a health care professional in a hospital or clinic setting. If you get this medicine at home, you will be taught how to prepare and give this medicine. Use exactly as directed. Take your medicine at regular intervals. Do not take your medicine more often than directed. It is important that you put your used needles and syringes in a special sharps container. Do not put them in a trash can. If you do not have a sharps container, call your pharmacist or healthcare provider to get one. A special MedGuide will be given to you by the pharmacist with each prescription and refill. Be sure to read this information carefully each time. Talk to your pediatrician regarding the use of this medicine in children. While this drug may be prescribed for selected conditions, precautions do apply. Overdosage: If you think you have taken too much of this medicine contact a poison control center or emergency  room at once. NOTE: This medicine is only for you. Do not share this medicine with others. What if I miss a dose? If you miss a dose, take it as soon as you can. If it is almost time for your next dose, take only that dose. Do not take double or extra doses. What may interact with this medication? Interactions have not been studied. This list may not describe all possible interactions. Give your health care provider a list of all the medicines, herbs, non-prescription drugs, or dietary supplements you use. Also tell them if you smoke, drink alcohol, or use illegal drugs. Some items may interact with your medicine. What should I watch for while using this medication? Your condition will be monitored carefully while you are receiving this medicine. You may need blood work done while you are taking this medicine. This medicine may cause a decrease in vitamin B6. You should make sure that you get enough vitamin B6 while you are taking this medicine. Discuss the foods you eat and the vitamins you take with your health care professional. What side effects may I notice from receiving this medication? Side effects that you should report to your doctor or health care professional as soon as possible: allergic reactions like skin rash, itching or hives, swelling of the face, lips, or tongue seizures signs and symptoms of a blood clot such as breathing problems; changes in vision; chest pain; severe, sudden headache; pain, swelling, warmth in the leg; trouble speaking; sudden numbness or weakness of the face, arm or leg signs and symptoms of a stroke like   changes in vision; confusion; trouble speaking or understanding; severe headaches; sudden numbness or weakness of the face, arm or leg; trouble walking; dizziness; loss of balance or coordination Side effects that usually do not require medical attention (report to your doctor or health care professional if they continue or are  bothersome): chills cough dizziness fever headaches joint pain muscle cramps muscle pain nausea, vomiting pain, redness, or irritation at site where injected This list may not describe all possible side effects. Call your doctor for medical advice about side effects. You may report side effects to FDA at 1-800-FDA-1088. Where should I keep my medication? Keep out of the reach of children. Store in a refrigerator between 2 and 8 degrees C (36 and 46 degrees F). Do not freeze or shake. Throw away any unused portion if using a single-dose vial. Multi-dose vials can be kept in the refrigerator for up to 21 days after the initial dose. Throw away unused medicine. NOTE: This sheet is a summary. It may not cover all possible information. If you have questions about this medicine, talk to your doctor, pharmacist, or health care provider.  2022 Elsevier/Gold Standard (2016-10-12 08:35:19)  

## 2020-08-30 ENCOUNTER — Encounter: Payer: Medicare Other | Admitting: Physical Therapy

## 2020-09-01 ENCOUNTER — Encounter: Payer: Medicare Other | Admitting: Physical Therapy

## 2020-09-02 ENCOUNTER — Telehealth: Payer: Self-pay

## 2020-09-02 NOTE — Telephone Encounter (Signed)
This nurse made patient aware of Katie Rue, NP recommendations.  Patient states that she comes to [facility] to get her B-12 injections and she would like to continue with the injections and is in agreement with doing them every 6 weeks.  Informed patient that  provider will be made aware.  Patient knows to call clinic with any problems, questons or concerns.

## 2020-09-02 NOTE — Telephone Encounter (Signed)
-----   Message from Alla Feeling, NP sent at 08/31/2020  2:25 PM EDT ----- Please let her know B12 is elevated. I believe she does home injections once per month. She can switch to oral B12 daily, or change injection to q6 weeks for now. If B12 drops we will resume monthly injections.  Thanks, Regan Rakers

## 2020-09-03 ENCOUNTER — Other Ambulatory Visit: Payer: Self-pay

## 2020-09-03 ENCOUNTER — Emergency Department (HOSPITAL_BASED_OUTPATIENT_CLINIC_OR_DEPARTMENT_OTHER): Payer: Medicare Other

## 2020-09-03 ENCOUNTER — Inpatient Hospital Stay (HOSPITAL_BASED_OUTPATIENT_CLINIC_OR_DEPARTMENT_OTHER)
Admission: EM | Admit: 2020-09-03 | Discharge: 2020-09-10 | DRG: 683 | Disposition: A | Discharge status: Home / Self Care | Payer: Medicare Other | Attending: Family Medicine | Admitting: Family Medicine

## 2020-09-03 ENCOUNTER — Encounter (HOSPITAL_BASED_OUTPATIENT_CLINIC_OR_DEPARTMENT_OTHER): Payer: Self-pay

## 2020-09-03 DIAGNOSIS — F32A Depression, unspecified: Secondary | ICD-10-CM | POA: Diagnosis not present

## 2020-09-03 DIAGNOSIS — B379 Candidiasis, unspecified: Secondary | ICD-10-CM | POA: Diagnosis present

## 2020-09-03 DIAGNOSIS — R197 Diarrhea, unspecified: Secondary | ICD-10-CM | POA: Diagnosis present

## 2020-09-03 DIAGNOSIS — I272 Pulmonary hypertension, unspecified: Secondary | ICD-10-CM | POA: Diagnosis present

## 2020-09-03 DIAGNOSIS — I2510 Atherosclerotic heart disease of native coronary artery without angina pectoris: Secondary | ICD-10-CM | POA: Diagnosis not present

## 2020-09-03 DIAGNOSIS — R634 Abnormal weight loss: Secondary | ICD-10-CM | POA: Diagnosis not present

## 2020-09-03 DIAGNOSIS — E876 Hypokalemia: Secondary | ICD-10-CM | POA: Diagnosis not present

## 2020-09-03 DIAGNOSIS — I5032 Chronic diastolic (congestive) heart failure: Secondary | ICD-10-CM | POA: Diagnosis present

## 2020-09-03 DIAGNOSIS — Z6826 Body mass index (BMI) 26.0-26.9, adult: Secondary | ICD-10-CM

## 2020-09-03 DIAGNOSIS — R1114 Bilious vomiting: Secondary | ICD-10-CM | POA: Diagnosis not present

## 2020-09-03 DIAGNOSIS — I129 Hypertensive chronic kidney disease with stage 1 through stage 4 chronic kidney disease, or unspecified chronic kidney disease: Secondary | ICD-10-CM | POA: Diagnosis not present

## 2020-09-03 DIAGNOSIS — E1122 Type 2 diabetes mellitus with diabetic chronic kidney disease: Secondary | ICD-10-CM | POA: Diagnosis not present

## 2020-09-03 DIAGNOSIS — Z853 Personal history of malignant neoplasm of breast: Secondary | ICD-10-CM

## 2020-09-03 DIAGNOSIS — Z832 Family history of diseases of the blood and blood-forming organs and certain disorders involving the immune mechanism: Secondary | ICD-10-CM

## 2020-09-03 DIAGNOSIS — Z7901 Long term (current) use of anticoagulants: Secondary | ICD-10-CM | POA: Diagnosis not present

## 2020-09-03 DIAGNOSIS — R627 Adult failure to thrive: Secondary | ICD-10-CM | POA: Diagnosis not present

## 2020-09-03 DIAGNOSIS — R748 Abnormal levels of other serum enzymes: Secondary | ICD-10-CM | POA: Diagnosis present

## 2020-09-03 DIAGNOSIS — N189 Chronic kidney disease, unspecified: Secondary | ICD-10-CM | POA: Diagnosis not present

## 2020-09-03 DIAGNOSIS — Z86718 Personal history of other venous thrombosis and embolism: Secondary | ICD-10-CM

## 2020-09-03 DIAGNOSIS — D509 Iron deficiency anemia, unspecified: Secondary | ICD-10-CM | POA: Diagnosis not present

## 2020-09-03 DIAGNOSIS — N390 Urinary tract infection, site not specified: Secondary | ICD-10-CM | POA: Diagnosis present

## 2020-09-03 DIAGNOSIS — E1140 Type 2 diabetes mellitus with diabetic neuropathy, unspecified: Secondary | ICD-10-CM | POA: Diagnosis present

## 2020-09-03 DIAGNOSIS — N179 Acute kidney failure, unspecified: Secondary | ICD-10-CM | POA: Diagnosis not present

## 2020-09-03 DIAGNOSIS — Z79899 Other long term (current) drug therapy: Secondary | ICD-10-CM

## 2020-09-03 DIAGNOSIS — K449 Diaphragmatic hernia without obstruction or gangrene: Secondary | ICD-10-CM | POA: Diagnosis not present

## 2020-09-03 DIAGNOSIS — R112 Nausea with vomiting, unspecified: Secondary | ICD-10-CM | POA: Diagnosis present

## 2020-09-03 DIAGNOSIS — K3189 Other diseases of stomach and duodenum: Secondary | ICD-10-CM | POA: Diagnosis not present

## 2020-09-03 DIAGNOSIS — E1142 Type 2 diabetes mellitus with diabetic polyneuropathy: Secondary | ICD-10-CM | POA: Diagnosis not present

## 2020-09-03 DIAGNOSIS — G894 Chronic pain syndrome: Secondary | ICD-10-CM

## 2020-09-03 DIAGNOSIS — N281 Cyst of kidney, acquired: Secondary | ICD-10-CM | POA: Diagnosis not present

## 2020-09-03 DIAGNOSIS — N184 Chronic kidney disease, stage 4 (severe): Secondary | ICD-10-CM | POA: Diagnosis not present

## 2020-09-03 DIAGNOSIS — G8929 Other chronic pain: Secondary | ICD-10-CM | POA: Diagnosis present

## 2020-09-03 DIAGNOSIS — I130 Hypertensive heart and chronic kidney disease with heart failure and stage 1 through stage 4 chronic kidney disease, or unspecified chronic kidney disease: Secondary | ICD-10-CM | POA: Diagnosis present

## 2020-09-03 DIAGNOSIS — Z833 Family history of diabetes mellitus: Secondary | ICD-10-CM

## 2020-09-03 DIAGNOSIS — K2970 Gastritis, unspecified, without bleeding: Secondary | ICD-10-CM | POA: Diagnosis not present

## 2020-09-03 DIAGNOSIS — I10 Essential (primary) hypertension: Secondary | ICD-10-CM | POA: Diagnosis present

## 2020-09-03 DIAGNOSIS — K2960 Other gastritis without bleeding: Secondary | ICD-10-CM | POA: Diagnosis present

## 2020-09-03 DIAGNOSIS — Z803 Family history of malignant neoplasm of breast: Secondary | ICD-10-CM

## 2020-09-03 DIAGNOSIS — M48062 Spinal stenosis, lumbar region with neurogenic claudication: Secondary | ICD-10-CM | POA: Diagnosis not present

## 2020-09-03 DIAGNOSIS — Z800 Family history of malignant neoplasm of digestive organs: Secondary | ICD-10-CM

## 2020-09-03 DIAGNOSIS — Z7984 Long term (current) use of oral hypoglycemic drugs: Secondary | ICD-10-CM

## 2020-09-03 DIAGNOSIS — Z20822 Contact with and (suspected) exposure to covid-19: Secondary | ICD-10-CM | POA: Diagnosis not present

## 2020-09-03 DIAGNOSIS — Z8249 Family history of ischemic heart disease and other diseases of the circulatory system: Secondary | ICD-10-CM

## 2020-09-03 DIAGNOSIS — Z801 Family history of malignant neoplasm of trachea, bronchus and lung: Secondary | ICD-10-CM

## 2020-09-03 DIAGNOSIS — K2950 Unspecified chronic gastritis without bleeding: Secondary | ICD-10-CM | POA: Diagnosis not present

## 2020-09-03 DIAGNOSIS — I152 Hypertension secondary to endocrine disorders: Secondary | ICD-10-CM | POA: Diagnosis present

## 2020-09-03 DIAGNOSIS — Z79890 Hormone replacement therapy (postmenopausal): Secondary | ICD-10-CM

## 2020-09-03 LAB — COMPREHENSIVE METABOLIC PANEL
ALT: 28 U/L (ref 0–44)
AST: 31 U/L (ref 15–41)
Albumin: 3.9 g/dL (ref 3.5–5.0)
Alkaline Phosphatase: 63 U/L (ref 38–126)
Anion gap: 14 (ref 5–15)
BUN: 46 mg/dL — ABNORMAL HIGH (ref 8–23)
CO2: 21 mmol/L — ABNORMAL LOW (ref 22–32)
Calcium: 10 mg/dL (ref 8.9–10.3)
Chloride: 103 mmol/L (ref 98–111)
Creatinine, Ser: 3.56 mg/dL — ABNORMAL HIGH (ref 0.44–1.00)
GFR, Estimated: 13 mL/min — ABNORMAL LOW (ref >60)
Glucose, Bld: 105 mg/dL — ABNORMAL HIGH (ref 70–99)
Potassium: 3.8 mmol/L (ref 3.5–5.1)
Sodium: 138 mmol/L (ref 135–145)
Total Bilirubin: 0.5 mg/dL (ref 0.3–1.2)
Total Protein: 7.7 g/dL (ref 6.5–8.1)

## 2020-09-03 LAB — CBC WITH DIFFERENTIAL/PLATELET
Abs Immature Granulocytes: 0.04 10*3/uL (ref 0.00–0.07)
Basophils Absolute: 0 10*3/uL (ref 0.0–0.1)
Basophils Relative: 0 %
Eosinophils Absolute: 0 10*3/uL (ref 0.0–0.5)
Eosinophils Relative: 0 %
HCT: 38.1 % (ref 36.0–46.0)
Hemoglobin: 12.2 g/dL (ref 12.0–15.0)
Immature Granulocytes: 0 %
Lymphocytes Relative: 21 %
Lymphs Abs: 2.1 10*3/uL (ref 0.7–4.0)
MCH: 27.1 pg (ref 26.0–34.0)
MCHC: 32 g/dL (ref 30.0–36.0)
MCV: 84.7 fL (ref 80.0–100.0)
Monocytes Absolute: 0.7 10*3/uL (ref 0.1–1.0)
Monocytes Relative: 7 %
Neutro Abs: 7 10*3/uL (ref 1.7–7.7)
Neutrophils Relative %: 72 %
Platelets: 286 10*3/uL (ref 150–400)
RBC: 4.5 MIL/uL (ref 3.87–5.11)
RDW: 15.2 % (ref 11.5–15.5)
WBC: 9.9 10*3/uL (ref 4.0–10.5)
nRBC: 0 % (ref 0.0–0.2)

## 2020-09-03 LAB — URINALYSIS, ROUTINE W REFLEX MICROSCOPIC
Bilirubin Urine: NEGATIVE
Glucose, UA: NEGATIVE mg/dL
Hgb urine dipstick: NEGATIVE
Ketones, ur: NEGATIVE mg/dL
Nitrite: NEGATIVE
Protein, ur: 100 mg/dL — AB
Specific Gravity, Urine: 1.016 (ref 1.005–1.030)
pH: 5.5 (ref 5.0–8.0)

## 2020-09-03 LAB — RESP PANEL BY RT-PCR (FLU A&B, COVID) ARPGX2
Influenza A by PCR: NEGATIVE
Influenza B by PCR: NEGATIVE
SARS Coronavirus 2 by RT PCR: NEGATIVE

## 2020-09-03 LAB — LIPASE, BLOOD: Lipase: 186 U/L — ABNORMAL HIGH (ref 11–51)

## 2020-09-03 MED ORDER — ACETAMINOPHEN 325 MG PO TABS: 650.0000 mg | ORAL_TABLET | Freq: Four times a day (QID) | ORAL | Status: DC | PRN

## 2020-09-03 MED ORDER — SODIUM CHLORIDE 0.9 % IV BOLUS
1000.0000 mL | Freq: Once | INTRAVENOUS | Status: AC
  Administered 2020-09-03: 1000 mL via INTRAVENOUS

## 2020-09-03 MED ORDER — ISOSORBIDE MONONITRATE ER 30 MG PO TB24
15.0000 mg | ORAL_TABLET | Freq: Every day | ORAL | Status: DC
  Administered 2020-09-04 – 2020-09-10 (×7): 15 mg via ORAL
  Filled 2020-09-03 (×7): qty 1

## 2020-09-03 MED ORDER — INSULIN ASPART 100 UNIT/ML IJ SOLN: 0.0000 [IU] | INTRAMUSCULAR | Status: DC

## 2020-09-03 MED ORDER — BUDESONIDE 3 MG PO CPEP
3.0000 mg | ORAL_CAPSULE | Freq: Every day | ORAL | Status: DC
  Administered 2020-09-04 – 2020-09-10 (×7): 3 mg via ORAL
  Filled 2020-09-03 (×7): qty 1

## 2020-09-03 MED ORDER — ONDANSETRON HCL 4 MG/2ML IJ SOLN
4.0000 mg | Freq: Four times a day (QID) | INTRAMUSCULAR | Status: DC | PRN
  Administered 2020-09-04: 4 mg via INTRAVENOUS
  Filled 2020-09-03: qty 2

## 2020-09-03 MED ORDER — LINAGLIPTIN 5 MG PO TABS: 5.0000 mg | ORAL_TABLET | Freq: Every day | ORAL | Status: DC

## 2020-09-03 MED ORDER — ONDANSETRON HCL 4 MG PO TABS: 4.0000 mg | ORAL_TABLET | Freq: Four times a day (QID) | ORAL | Status: DC | PRN

## 2020-09-03 MED ORDER — HYDROMORPHONE HCL 2 MG PO TABS: 2.0000 mg | ORAL_TABLET | Freq: Four times a day (QID) | ORAL | Status: DC | PRN

## 2020-09-03 MED ORDER — MELATONIN 5 MG PO TABS
10.0000 mg | ORAL_TABLET | Freq: Every day | ORAL | Status: DC
  Administered 2020-09-03 – 2020-09-09 (×7): 10 mg via ORAL
  Filled 2020-09-03 (×8): qty 2

## 2020-09-03 MED ORDER — AMLODIPINE BESYLATE 10 MG PO TABS
10.0000 mg | ORAL_TABLET | Freq: Every day | ORAL | Status: DC
  Administered 2020-09-04 – 2020-09-10 (×7): 10 mg via ORAL
  Filled 2020-09-03 (×7): qty 1

## 2020-09-03 MED ORDER — HYDRALAZINE HCL 50 MG PO TABS
100.0000 mg | ORAL_TABLET | Freq: Three times a day (TID) | ORAL | Status: DC
  Administered 2020-09-03 – 2020-09-10 (×19): 100 mg via ORAL
  Filled 2020-09-03 (×19): qty 2

## 2020-09-03 MED ORDER — LACTATED RINGERS IV SOLN: INTRAVENOUS | Status: DC

## 2020-09-03 MED ORDER — ACETAMINOPHEN 650 MG RE SUPP: 650.0000 mg | Freq: Four times a day (QID) | RECTAL | Status: DC | PRN

## 2020-09-03 MED ORDER — ONDANSETRON HCL 4 MG/2ML IJ SOLN
4.0000 mg | Freq: Once | INTRAMUSCULAR | Status: AC
  Administered 2020-09-03: 4 mg via INTRAVENOUS
  Filled 2020-09-03: qty 2

## 2020-09-03 MED ORDER — NEBIVOLOL HCL 10 MG PO TABS
10.0000 mg | ORAL_TABLET | Freq: Every day | ORAL | Status: DC
  Administered 2020-09-04 – 2020-09-10 (×7): 10 mg via ORAL
  Filled 2020-09-03 (×7): qty 1

## 2020-09-03 MED ORDER — CLOTRIMAZOLE 10 MG MT TROC
10.0000 mg | Freq: Every day | OROMUCOSAL | Status: DC
  Administered 2020-09-04 – 2020-09-10 (×29): 10 mg via ORAL
  Filled 2020-09-03 (×35): qty 1

## 2020-09-03 MED ORDER — APIXABAN 5 MG PO TABS
5.0000 mg | ORAL_TABLET | Freq: Two times a day (BID) | ORAL | Status: DC
  Administered 2020-09-03 – 2020-09-06 (×7): 5 mg via ORAL
  Filled 2020-09-03 (×8): qty 1

## 2020-09-03 MED ORDER — FERROUS FUMARATE 324 (106 FE) MG PO TABS
324.0000 mg | ORAL_TABLET | Freq: Every day | ORAL | Status: DC
  Administered 2020-09-04 – 2020-09-05 (×2): 318 mg via ORAL
  Filled 2020-09-03 (×2): qty 3

## 2020-09-03 NOTE — ED Triage Notes (Signed)
Patient to ED with a complaint of nausea with weight loss since May 24th. Has followed up with GI, placed on acid reflux meds since last Monday, not helping. Now presents with vomiting, which is a new symptom for her. Denies any abd pain.

## 2020-09-03 NOTE — ED Notes (Signed)
DaughterEmily [phone] can be contacted for patient status.

## 2020-09-03 NOTE — ED Provider Notes (Signed)
[facility] EMERGENCY DEPT Provider Note   CSN: [id number] Arrival date & time: 09/03/20  1516     History Chief Complaint  Patient presents with   Nausea   Emesis    [name] is a 75 y.o. female.  HPI 75 year old female presents with vomiting.  She has been having nausea for about 3 or 4 weeks.  Started that last week of May.  Seen in the emergency department sick/1 with a fairly negative CT abdomen and pelvis.  Since then intermittent nausea.  Went to see GI a week or 2 ago and was prescribed Protonix and budesonide.  Has not helped.  Now since yesterday she is actually having vomiting, before it was just nausea.  2 episodes yesterday and 2 today.  No blood.  Some lower abdominal discomfort that has been there for a while.  Just got off antibiotics for a UTI from her PCP, does remember the antibiotic but she was also put on clotrimazole for a yeast infection, starting now.  No fevers, chest pain, new dyspnea (endorses some chronic trouble breathing). Last BM 2 days ago. No significant flatus. Has lost nearly 20 pounds during this time.  Past Medical History:  Diagnosis Date   Abdominal discomfort    Chronic N/V/D. Presumptive dx Crohn's dx per elevated p ANCA. Failed Entocort and Pentasa. Sep 2003 - ileocolectomy c anastomosis per Dr Deon Pilling 2/2 adhesions - path was hegative for Crohns. EGD, Sm bowel follow through (11/03), and an eteroclysis (10/03) were unrevealing. Cuases hypomag and hypocalcemia.   Adnexal mass 8/03   s/p lap BSO (R ovarian fibroma) & lysis of adhesions   Allergy    Seasonal   Anemia    Multifactorial. Baseline HgB 10-11 ish. B12 def - 150 in 3/10. Fe Def - ferritin 35 3/10. Both are being repleted.   Breast cancer ([place]) 03/16/13   right, 5 o'clock   CAD (coronary artery disease) 1996   1996 - PTCA and angioplasty diagonal branch. 2000 - Rotoblator & angiopllasty of diagonal. 2006 - subendocardial AMI, DES to proximal LAD.Marland Kitchen Also had 90%  stenosis in distal apical LAD. EF 55 with apical hypokinesis. Indefinite ASA and Plavix.   CHF (congestive heart failure) (HCC)    Chronic kidney disease    Chronic renal insuff baseline Cr 1.2 - 1.4 ish.   Chronic pain    CT 10/10 = Spinal stenosis L2 - S1.   Diabetes mellitus    Insulin dependent   Gout    Hx of radiation therapy 06/02/13- 07/16/13   right rbeast 4500 cGy 25 sessions, right breast boost 1600 cGy in 8 sessions   Hyperlipidemia    Managed with both a statin and Welchol. Welchol stopped 2014 2/2 cost and started on fenofibrate    Hypertension    2006 B renal arteries patent. 2003 MRA - no RAS. 2003 pheo W/U Dr Hassell Done reportedly negative.   Hypoxia 07/23/2017   Lupus ([place])    Lymphedema of breast    Personal history of radiation therapy 2015   RBBB    Renal vein thrombosis (HCC)    SBO (small bowel obstruction) ([place]) 09/17/2017   Secondary hyperparathyroidism ([place])    Stroke [facility])    Incidental finding MRI 2002 L lacunar infarct   Vitamin B12 deficiency    Vitamin D deficiency    Wears dentures    top    Patient Active Problem List   Diagnosis Date Noted   Acute renal failure superimposed on stage  4 chronic kidney disease ([place]) 09/03/2020   Chronic diastolic CHF (congestive heart failure) ([place]) 09/03/2020   Nausea and vomiting 09/03/2020   CHF (congestive heart failure) ([place]) 07/20/2020   Dyspnea 06/28/2020   Chronic diarrhea - responds to budesonide 09/10/2019   Iron deficiency anemia 08/19/2018   Spondylosis of lumbar spine 12/24/2017   Renal vein thrombosis ([place]) 09/17/2017   Hoarse voice quality 09/09/2017   Low back pain without sciatica 09/09/2017   AKI (acute kidney injury) ([place]) 08/25/2017   Labile blood glucose    Sleep disturbance    Leukocytosis    Essential hypertension    Urinary frequency    Type 2 diabetes mellitus with peripheral neuropathy (HCC)    Benign essential HTN    Stage 2 chronic kidney disease    Dysphagia    Debility     Encephalopathy    Pressure injury of skin 08/04/2017   Pneumonia of left lung due to infectious organism    Diffuse pulmonary alveolar hemorrhage    SOB (shortness of breath) 10/11/2016   Hyperparathyroidism, secondary renal ([place]) 05/02/2016   Gout 04/11/2015   Diabetic neuropathy associated with type 2 diabetes mellitus ([place]) 01/06/2015   SBO (small bowel obstruction) ([place]) 09/15/2013   Breast cancer of lower-inner quadrant of right female breast ([place]) 03/18/2013   Chronic venous insufficiency 01/06/2013   Health care maintenance 05/08/2011   Diabetes mellitus type 2 in obese ([place]) 05/03/2010   Seasonal allergies 05/03/2010   Labile hypertension    CAD (coronary artery disease)    Abdominal discomfort    Anemia in chronic kidney disease    History of stroke without residual deficits    Hyperlipidemia associated with type 2 diabetes mellitus (HCC)    Chronic pain    Chronic kidney disease, stage III (moderate) ([place])    Adnexal mass    Spinal stenosis, lumbar region, with neurogenic claudication 01/03/2009    Past Surgical History:  Procedure Laterality Date   ABDOMINAL HYSTERECTOMY     BILATERAL SALPINGOOPHORECTOMY  8/03   Lap BSO (R ovarian fibroma) and adhesion lysis   BOWEL RESECTION  2003   ileocolectomy with anastomosis 2/2 adhesions   BREAST BIOPSY Right 2015   BREAST BIOPSY Right 07/2014   BREAST LUMPECTOMY Right 04/22/2013   BREAST LUMPECTOMY WITH NEEDLE LOCALIZATION AND AXILLARY SENTINEL LYMPH NODE BX Right 04/22/2013   Procedure: BREAST LUMPECTOMY WITH NEEDLE LOCALIZATION AND AXILLARY SENTINEL LYMPH NODE BX;  Surgeon: Stark Klein, MD;  Location: [facility];  Service: General;  Laterality: Right;   CARDIAC CATHETERIZATION     2 stents   CHOLECYSTECTOMY     COLONOSCOPY     HEMICOLECTOMY     R sided hemicolectomy   HERNIA REPAIR     Ventral hernia repair   PTCA  4/06     OB History   No obstetric history on file.     Family History  Problem  Relation Age of Onset   Pancreatic cancer Brother 68   Breast cancer Mother 80   Lung cancer Maternal Aunt    Breast cancer Maternal Aunt    Prostate cancer Maternal Uncle    Heart attack Maternal Grandmother    Breast cancer Maternal Grandmother    Colon cancer Maternal Grandmother    Clotting disorder Maternal Grandmother    Diabetes Maternal Grandmother    Kidney failure Maternal Aunt    Hypertension Daughter     Social History   Tobacco Use   Smoking status: Never  Smokeless tobacco: Never  Vaping Use   Vaping Use: Never used  Substance Use Topics   Alcohol use: No    Alcohol/week: 0.0 standard drinks   Drug use: No    Home Medications Prior to Admission medications   Medication Sig Start Date End Date Taking? Authorizing Provider  allopurinol (ZYLOPRIM) 100 MG tablet Take 0.5 tablets (50 mg total) by mouth every other day. 07/01/20  Yes Mercy Riding, MD  amLODipine (NORVASC) 10 MG tablet Take 1 tablet (10 mg total) by mouth daily. 08/22/17  Yes Angiulli, Lavon Paganini, PA-C  apixaban (ELIQUIS) 5 MG TABS tablet Take 1 tablet (5 mg total) by mouth 2 (two) times daily. Start after you finish the starter pack 07/24/20  Yes Vann, Jessica U, DO  clotrimazole (MYCELEX) 10 MG troche Take 10 mg by mouth 5 (five) times daily. Yeast in urine, started today   Yes [provider]  Cyanocobalamin (B-12 COMPLIANCE INJECTION IJ) Inject 1 vial as directed every 30 (thirty) days.   Yes [provider]  Epoetin Alfa-epbx (RETACRIT IJ) Inject 1 vial as directed every 30 (thirty) days.   Yes [provider]  ergocalciferol (VITAMIN D2) 1.25 MG (50000 UT) capsule Take 1 capsule (50,000 Units total) by mouth once a week. 08/02/20  Yes Truitt Merle, MD  ferrous fumarate (HEMOCYTE - 106 MG FE) 325 (106 Fe) MG TABS tablet Take 325 mg of iron by mouth daily.   Yes [provider]  hydrALAZINE (APRESOLINE) 100 MG tablet TAKE 1 TABLET BY MOUTH 3  TIMES DAILY Patient taking  differently: Take 100 mg by mouth 3 (three) times daily. 01/20/20  Yes Josue Hector, MD  HYDROmorphone (DILAUDID) 2 MG tablet Take 1 tablet (2 mg total) by mouth every 6 (six) hours as needed for severe pain. 07/14/20  Yes Corena Herter, PA-C  isosorbide mononitrate (IMDUR) 30 MG 24 hr tablet TAKE 1/2 (ONE-HALF) TABLET BY MOUTH ONCE DAILY (PLEASE  SCHEDULE  AN  APPOINTMENT  FOR  FURTHER  REFILLS) Patient taking differently: Take 15 mg by mouth daily. 07/12/20  Yes Isaiah Serge, NP  linagliptin (TRADJENTA) 5 MG TABS tablet Take 5 mg by mouth daily.   Yes [provider]  Melatonin 5 MG TABS Take 10 mg by mouth at bedtime.   Yes [provider]  nebivolol (BYSTOLIC) 10 MG tablet Take 1 tablet (10 mg total) by mouth daily. 06/30/20  Yes Mercy Riding, MD  nitroGLYCERIN (NITROSTAT) 0.4 MG SL tablet DISSOLVE 1 TABLET UNDER THE TONGUE EVERY 5 MINUTES AS  NEEDED FOR CHEST PAIN. MAX  OF 3 TABLETS IN 15 MINUTES. CALL 911 IF PAIN PERSISTS. Patient taking differently: Place 0.4 mg under the tongue every 5 (five) minutes as needed for chest pain. 10/28/19  Yes Josue Hector, MD  ondansetron (ZOFRAN ODT) 4 MG disintegrating tablet Take 1 tablet (4 mg total) by mouth every 8 (eight) hours as needed for nausea or vomiting. 08/17/20  Yes Walisiewicz, Kaitlyn E, PA-C  pantoprazole (PROTONIX) 40 MG tablet TAKE 1 TABLET BY MOUTH  DAILY BEFORE BREAKFAST Patient taking differently: Take 40 mg by mouth daily. 08/11/20  Yes Gatha Mayer, MD  pregabalin (LYRICA) 25 MG capsule Take 1 capsule (25 mg total) by mouth 2 (two) times daily. 07/19/20  Yes Kirsteins, Luanna Salk, MD  rosuvastatin (CRESTOR) 5 MG tablet Take 5 mg by mouth daily.   Yes [provider]  torsemide (DEMADEX) 20 MG tablet Take 2 tablets (40  mg total) by mouth daily. 07/11/20  Yes Josue Hector, MD  budesonide (ENTOCORT EC) 3 MG 24 hr capsule Take 1 capsule by mouth once daily Patient taking differently: Take 3 mg by mouth  daily. 04/06/20   Gatha Mayer, MD    Allergies    Percocet [oxycodone-acetaminophen]; Diazepam; Haloperidol lactate; Lorazepam; Morphine sulfate; Propoxyphene hcl; Tramadol hcl; Anesthetics, halogenated; Etomidate; Fentanyl; Haloperidol; Morphine sulfate; Oxycodone-acetaminophen; Propoxyphene; Tramadol hcl; and Versed [midazolam]  Review of Systems   Review of Systems  Constitutional:  Positive for unexpected weight change. Negative for fever.  Respiratory:  Negative for shortness of breath.   Cardiovascular:  Negative for chest pain.  Gastrointestinal:  Positive for abdominal pain, nausea and vomiting. Negative for diarrhea.  Genitourinary:  Negative for dysuria.  All other systems reviewed and are negative.  Physical Exam Updated Vital Signs BP (!) 144/88 (BP Location: Right Arm)   Pulse 83   Temp 98.2 F (36.8 C) (Oral)   Resp 16   Ht 5' 4"  (1.626 m)   Wt 69.7 kg   SpO2 97%   BMI 26.38 kg/m   Physical Exam Vitals and nursing note reviewed.  Constitutional:      General: She is not in acute distress.    Appearance: She is well-developed. She is not ill-appearing or diaphoretic.  HENT:     Head: Normocephalic and atraumatic.     Right Ear: External ear normal.     Left Ear: External ear normal.     Nose: Nose normal.  Eyes:     General:        Right eye: No discharge.        Left eye: No discharge.  Cardiovascular:     Rate and Rhythm: Normal rate and regular rhythm.     Heart sounds: Normal heart sounds.  Pulmonary:     Effort: Pulmonary effort is normal.     Breath sounds: Normal breath sounds.  Abdominal:     Palpations: Abdomen is soft.     Tenderness: There is abdominal tenderness (mild, hard to localize in lower abdomen). There is no right CVA tenderness or left CVA tenderness.  Skin:    General: Skin is warm and dry.  Neurological:     Mental Status: She is alert.  Psychiatric:        Mood and Affect: Mood is not anxious.    ED Results /  Procedures / Treatments   Labs (all labs ordered are listed, but only abnormal results are displayed) Labs Reviewed  COMPREHENSIVE METABOLIC PANEL - Abnormal; Notable for the following components:      Result Value   CO2 21 (*)    Glucose, Bld 105 (*)    BUN 46 (*)    Creatinine, Ser 3.56 (*)    GFR, Estimated 13 (*)    All other components within normal limits  LIPASE, BLOOD - Abnormal; Notable for the following components:   Lipase 186 (*)    All other components within normal limits  URINALYSIS, ROUTINE W REFLEX MICROSCOPIC - Abnormal; Notable for the following components:   APPearance HAZY (*)    Protein, ur 100 (*)    Leukocytes,Ua SMALL (*)    Bacteria, UA MANY (*)    All other components within normal limits  RESP PANEL BY RT-PCR (FLU A&B, COVID) [id number]  URINE CULTURE  CBC WITH DIFFERENTIAL/PLATELET  RAPID URINE DRUG SCREEN, HOSP PERFORMED  CBC  BASIC METABOLIC PANEL  HEMOGLOBIN A1C    EKG  None  Radiology CT ABDOMEN PELVIS WO CONTRAST  Result Date: 09/03/2020 CLINICAL DATA:  Nausea and vomiting. EXAM: CT ABDOMEN AND PELVIS WITHOUT CONTRAST TECHNIQUE: Multidetector CT imaging of the abdomen and pelvis was performed following the standard protocol without IV contrast. COMPARISON:  August 17, 2020 FINDINGS: Lower chest: Small hiatal hernia. Postsurgical changes in the right breast. Hepatobiliary: No focal liver abnormality is seen. Status post cholecystectomy. No biliary dilatation. Pancreas: Unremarkable. No pancreatic ductal dilatation or surrounding inflammatory changes. Spleen: Normal in size without focal abnormality. Adrenals/Urinary Tract: Adrenal glands are unremarkable. Kidneys are normal, without renal calculi, focal lesion, or hydronephrosis. Bladder is unremarkable. Bilateral renal cysts. Stomach/Bowel: Stomach is within normal limits. Sequela of prior right ileocecal anastomosis. No evidence of bowel wall thickening, distention, or inflammatory changes.  Vascular/Lymphatic: Aortic atherosclerosis. No enlarged abdominal or pelvic lymph nodes. Reproductive: Status post hysterectomy. No adnexal masses. Other: No abdominal wall hernia or abnormality. No abdominopelvic ascites. Musculoskeletal: No acute or significant osseous findings. IMPRESSION: 1. No evidence of acute abnormalities within the abdomen or pelvis. 2. Small hiatal hernia. 3. Aortic atherosclerosis. Aortic Atherosclerosis (ICD10-I70.0). Electronically Signed   By: Fidela Salisbury M.D.   On: 09/03/2020 19:20    Procedures Procedures   Medications Ordered in ED Medications  lactated ringers infusion ( Intravenous New Bag/Given 09/03/20 2032)  apixaban (ELIQUIS) tablet 5 mg (5 mg Oral Given 09/03/20 2310)  nebivolol (BYSTOLIC) tablet 10 mg (has no administration in time range)  amLODipine (NORVASC) tablet 10 mg (has no administration in time range)  isosorbide mononitrate (IMDUR) 24 hr tablet 15 mg (has no administration in time range)  hydrALAZINE (APRESOLINE) tablet 100 mg (100 mg Oral Given 09/03/20 2310)  ferrous fumarate (HEMOCYTE - 106 mg FE) tablet 318 mg of iron (has no administration in time range)  budesonide (ENTOCORT EC) 24 hr capsule 3 mg (has no administration in time range)  HYDROmorphone (DILAUDID) tablet 2 mg (has no administration in time range)  melatonin tablet 10 mg (10 mg Oral Given 09/03/20 2310)  acetaminophen (TYLENOL) tablet 650 mg (has no administration in time range)    Or  acetaminophen (TYLENOL) suppository 650 mg (has no administration in time range)  ondansetron (ZOFRAN) tablet 4 mg (has no administration in time range)    Or  ondansetron (ZOFRAN) injection 4 mg (has no administration in time range)  clotrimazole (MYCELEX) troche 10 mg (has no administration in time range)  insulin aspart (novoLOG) injection 0-9 Units (has no administration in time range)  sodium chloride 0.9 % bolus 1,000 mL (0 mLs Intravenous Stopped 09/03/20 2031)  ondansetron  (ZOFRAN) injection 4 mg (4 mg Intravenous Given 09/03/20 2033)    ED Course  I have reviewed the triage vital signs and the nursing notes.  Pertinent labs & imaging results that were available during my care of the patient were reviewed by me and considered in my medical decision making (see chart for details).    MDM Rules/Calculators/A&P                          Patient has acute on chronic kidney injury.  Likely from the acute vomiting and poor p.o. intake.  CT is fairly unrevealing at this time.  Unclear if this is a gastric pathology but she will probably need GI consultation and needs admission for further fluids and work-up.  Discussed with Dr. Myna Hidalgo for admission. Final Clinical Impression(s) / ED Diagnoses Final diagnoses:  Acute kidney injury superimposed  on chronic kidney disease [facility])    Rx / DC Orders ED Discharge Orders     None        Sherwood Gambler, MD 09/03/20 [phone]

## 2020-09-03 NOTE — H&P (Signed)
History and Physical    [name] XHB:[id number] DOB: 1945-04-07 DOA: 09/03/2020  PCP: Seward Carol, MD  Patient coming from: Home  I have personally briefly reviewed patient's old medical records in [place]  Chief Complaint: N/V, wt loss  HPI: [name] is a 75 y.o. female with medical history significant of DM2, HTN, CKD 4, HFpEF with grade 1 DD, chronic pain, Breast CA in remission.  Prior ? Of Crohn's dz in 2003.  Pt presents to ED at [facility] with c/o ongoing nausea and wt loss for past 3-4 weeks.  Today had vomiting which is a new symptom for her.  Pt seen at ED previously, CT scan AP fairly neg.  Went to GI a week or two ago: protonix and budesonide prescribed which hasnt really helped.  2 episodes of vomiting yesterday and 2 episodes today.  Just got off ABx for UTI from PCP.  Was going to start clotrimazole for yeast infection but hasnt started yet.  20lbs wt loss over past month.  Presents to ED for N/V.  No fevers, CP, new dyspnea.   ED Course: CT A/P again essentially unremarkable.  Concerning however is that pt has AKI on top of her CKD 4.  Creat of 3.5 today is up from 2.4 earlier this month.  Pt started on IVF and transferred to [facility] for admit.   Review of Systems: As per HPI, otherwise all review of systems negative.  Past Medical History:  Diagnosis Date   Abdominal discomfort    Chronic N/V/D. Presumptive dx Crohn's dx per elevated p ANCA. Failed Entocort and Pentasa. Sep 2003 - ileocolectomy c anastomosis per Dr Deon Pilling 2/2 adhesions - path was hegative for Crohns. EGD, Sm bowel follow through (11/03), and an eteroclysis (10/03) were unrevealing. Cuases hypomag and hypocalcemia.   Adnexal mass 8/03   s/p lap BSO (R ovarian fibroma) & lysis of adhesions   Allergy    Seasonal   Anemia    Multifactorial. Baseline HgB 10-11 ish. B12 def - 150 in 3/10. Fe Def - ferritin 35 3/10. Both are being repleted.   Breast cancer ([place]) 03/16/13    right, 5 o'clock   CAD (coronary artery disease) 1996   1996 - PTCA and angioplasty diagonal branch. 2000 - Rotoblator & angiopllasty of diagonal. 2006 - subendocardial AMI, DES to proximal LAD.Marland Kitchen Also had 90% stenosis in distal apical LAD. EF 55 with apical hypokinesis. Indefinite ASA and Plavix.   CHF (congestive heart failure) (HCC)    Chronic kidney disease    Chronic renal insuff baseline Cr 1.2 - 1.4 ish.   Chronic pain    CT 10/10 = Spinal stenosis L2 - S1.   Diabetes mellitus    Insulin dependent   Gout    Hx of radiation therapy 06/02/13- 07/16/13   right rbeast 4500 cGy 25 sessions, right breast boost 1600 cGy in 8 sessions   Hyperlipidemia    Managed with both a statin and Welchol. Welchol stopped 2014 2/2 cost and started on fenofibrate    Hypertension    2006 B renal arteries patent. 2003 MRA - no RAS. 2003 pheo W/U Dr Hassell Done reportedly negative.   Hypoxia 07/23/2017   Lupus ([place])    Lymphedema of breast    Personal history of radiation therapy 2015   RBBB    Renal vein thrombosis (HCC)    SBO (small bowel obstruction) ([place]) 09/17/2017   Secondary hyperparathyroidism ([place])    Stroke ([place])  Incidental finding MRI 2002 L lacunar infarct   Vitamin B12 deficiency    Vitamin D deficiency    Wears dentures    top    Past Surgical History:  Procedure Laterality Date   ABDOMINAL HYSTERECTOMY     BILATERAL SALPINGOOPHORECTOMY  8/03   Lap BSO (R ovarian fibroma) and adhesion lysis   BOWEL RESECTION  2003   ileocolectomy with anastomosis 2/2 adhesions   BREAST BIOPSY Right 2015   BREAST BIOPSY Right 07/2014   BREAST LUMPECTOMY Right 04/22/2013   BREAST LUMPECTOMY WITH NEEDLE LOCALIZATION AND AXILLARY SENTINEL LYMPH NODE BX Right 04/22/2013   Procedure: BREAST LUMPECTOMY WITH NEEDLE LOCALIZATION AND AXILLARY SENTINEL LYMPH NODE BX;  Surgeon: Stark Klein, MD;  Location: [place];  Service: General;  Laterality: Right;   CARDIAC CATHETERIZATION     2 stents    CHOLECYSTECTOMY     COLONOSCOPY     HEMICOLECTOMY     R sided hemicolectomy   HERNIA REPAIR     Ventral hernia repair   PTCA  4/06     reports that she has never smoked. She has never used smokeless tobacco. She reports that she does not drink alcohol and does not use drugs.  Allergies  Allergen Reactions   Percocet [Oxycodone-Acetaminophen] Other (See Comments)    headaches   Diazepam Other (See Comments)    REACTION: Agitation Other reaction(s): agitation   Haloperidol Lactate Nausea And Vomiting   Lorazepam Nausea Only    Other reaction(s): nausea   Morphine Sulfate Other (See Comments)    REACTION: Agitation   Propoxyphene Hcl Nausea And Vomiting   Tramadol Hcl Nausea And Vomiting   Anesthetics, Halogenated     Per patient's daughter, she can not tolerate anesthetics.    Etomidate     Heart stopped during anesthesia   Fentanyl     Heart stopped during anesthesia    Haloperidol     Other reaction(s): nausea & vomitin   Morphine Sulfate     Other reaction(s): nausea   Oxycodone-Acetaminophen     Other reaction(s): headaches   Propoxyphene     Other reaction(s): nausea & vomiting   Tramadol Hcl     Other reaction(s): nausea & vomitin Other reaction(s): nausea & vomitin   Versed [Midazolam]     Heart stopped during anesthesia    Family History  Problem Relation Age of Onset   Pancreatic cancer Brother 110   Breast cancer Mother 109   Lung cancer Maternal Aunt    Breast cancer Maternal Aunt    Prostate cancer Maternal Uncle    Heart attack Maternal Grandmother    Breast cancer Maternal Grandmother    Colon cancer Maternal Grandmother    Clotting disorder Maternal Grandmother    Diabetes Maternal Grandmother    Kidney failure Maternal Aunt    Hypertension Daughter      Prior to Admission medications   Medication Sig Start Date End Date Taking? Authorizing Provider  allopurinol (ZYLOPRIM) 100 MG tablet Take 0.5 tablets (50 mg total) by mouth every other  day. 07/01/20  Yes Mercy Riding, MD  amLODipine (NORVASC) 10 MG tablet Take 1 tablet (10 mg total) by mouth daily. 08/22/17  Yes Angiulli, Lavon Paganini, PA-C  apixaban (ELIQUIS) 5 MG TABS tablet Take 1 tablet (5 mg total) by mouth 2 (two) times daily. Start after you finish the starter pack 07/24/20  Yes Vann, Jessica U, DO  clotrimazole (MYCELEX) 10 MG troche Take 10 mg by  mouth 5 (five) times daily. Yeast in urine, started today   Yes [provider]  Cyanocobalamin (B-12 COMPLIANCE INJECTION IJ) Inject 1 vial as directed every 30 (thirty) days.   Yes [provider]  Epoetin Alfa-epbx (RETACRIT IJ) Inject 1 vial as directed every 30 (thirty) days.   Yes [provider]  ergocalciferol (VITAMIN D2) 1.25 MG (50000 UT) capsule Take 1 capsule (50,000 Units total) by mouth once a week. 08/02/20  Yes Truitt Merle, MD  ferrous fumarate (HEMOCYTE - 106 MG FE) 325 (106 Fe) MG TABS tablet Take 325 mg of iron by mouth daily.   Yes [provider]  hydrALAZINE (APRESOLINE) 100 MG tablet TAKE 1 TABLET BY MOUTH 3  TIMES DAILY Patient taking differently: Take 100 mg by mouth 3 (three) times daily. 01/20/20  Yes Josue Hector, MD  HYDROmorphone (DILAUDID) 2 MG tablet Take 1 tablet (2 mg total) by mouth every 6 (six) hours as needed for severe pain. 07/14/20  Yes Corena Herter, PA-C  isosorbide mononitrate (IMDUR) 30 MG 24 hr tablet TAKE 1/2 (ONE-HALF) TABLET BY MOUTH ONCE DAILY (PLEASE  SCHEDULE  AN  APPOINTMENT  FOR  FURTHER  REFILLS) Patient taking differently: Take 15 mg by mouth daily. 07/12/20  Yes Isaiah Serge, NP  linagliptin (TRADJENTA) 5 MG TABS tablet Take 5 mg by mouth daily.   Yes [provider]  Melatonin 5 MG TABS Take 10 mg by mouth at bedtime.   Yes [provider]  nebivolol (BYSTOLIC) 10 MG tablet Take 1 tablet (10 mg total) by mouth daily. 06/30/20  Yes Mercy Riding, MD  budesonide (ENTOCORT EC) 3 MG 24 hr capsule Take 1 capsule by mouth once  daily Patient taking differently: Take 3 mg by mouth daily. 04/06/20   Gatha Mayer, MD  nitroGLYCERIN (NITROSTAT) 0.4 MG SL tablet DISSOLVE 1 TABLET UNDER THE TONGUE EVERY 5 MINUTES AS  NEEDED FOR CHEST PAIN. MAX  OF 3 TABLETS IN 15 MINUTES. CALL 911 IF PAIN PERSISTS. Patient taking differently: Place 0.4 mg under the tongue every 5 (five) minutes as needed for chest pain. 10/28/19   Josue Hector, MD  ondansetron (ZOFRAN ODT) 4 MG disintegrating tablet Take 1 tablet (4 mg total) by mouth every 8 (eight) hours as needed for nausea or vomiting. 08/17/20   Walisiewicz, Kaitlyn E, PA-C  pantoprazole (PROTONIX) 40 MG tablet TAKE 1 TABLET BY MOUTH  DAILY BEFORE BREAKFAST Patient taking differently: Take 40 mg by mouth daily. 08/11/20   Gatha Mayer, MD  pregabalin (LYRICA) 25 MG capsule Take 1 capsule (25 mg total) by mouth 2 (two) times daily. 07/19/20   Kirsteins, Luanna Salk, MD  rosuvastatin (CRESTOR) 5 MG tablet Take 5 mg by mouth daily.    [provider]  torsemide (DEMADEX) 20 MG tablet Take 2 tablets (40 mg total) by mouth daily. 07/11/20   Josue Hector, MD    Physical Exam: Vitals:   09/03/20 1932 09/03/20 2030 09/03/20 2036 09/03/20 2219  BP: (!) 150/80 (!) 175/75 (!) 159/73 (!) 144/88  Pulse: 82 83 81 83  Resp: 16 19 12 16   Temp:    98.2 F (36.8 C)  TempSrc:    Oral  SpO2: 100% 100% 100% 97%  Weight:    69.7 kg  Height:    5' 4"  (1.626 m)    Constitutional: NAD, calm, comfortable Eyes: PERRL, lids and conjunctivae normal ENMT: Mucous membranes are moist. Posterior pharynx clear of any  exudate or lesions.Normal dentition.  Neck: normal, supple, no masses, no thyromegaly Respiratory: clear to auscultation bilaterally, no wheezing, no crackles. Normal respiratory effort. No accessory muscle use.  Cardiovascular: Regular rate and rhythm, no murmurs / rubs / gallops. No extremity edema. 2+ pedal pulses. No carotid bruits.  Abdomen: no tenderness, no masses palpated. No  hepatosplenomegaly. Bowel sounds positive.  Musculoskeletal: no clubbing / cyanosis. No joint deformity upper and lower extremities. Good ROM, no contractures. Normal muscle tone.  Skin: no rashes, lesions, ulcers. No induration Neurologic: CN 2-12 grossly intact. Sensation intact, DTR normal. Strength 5/5 in all 4.  Psychiatric: Normal judgment and insight. Alert and oriented x 3. Normal mood.    Labs on Admission: I have personally reviewed following labs and imaging studies  CBC: Recent Labs  Lab 09/03/20 1919  WBC 9.9  NEUTROABS 7.0  HGB 12.2  HCT 38.1  MCV 84.7  PLT 536   Basic Metabolic Panel: Recent Labs  Lab 09/03/20 1919  NA 138  K 3.8  CL 103  CO2 21*  GLUCOSE 105*  BUN 46*  CREATININE 3.56*  CALCIUM 10.0   GFR: Estimated Creatinine Clearance: 13.5 mL/min (A) (by C-G formula based on SCr of 3.56 mg/dL (H)). Liver Function Tests: Recent Labs  Lab 09/03/20 1919  AST 31  ALT 28  ALKPHOS 63  BILITOT 0.5  PROT 7.7  ALBUMIN 3.9   Recent Labs  Lab 09/03/20 1919  LIPASE 186*   No results for input(s): AMMONIA in the last 168 hours. Coagulation Profile: No results for input(s): INR, PROTIME in the last 168 hours. Cardiac Enzymes: No results for input(s): CKTOTAL, CKMB, CKMBINDEX, TROPONINI in the last 168 hours. BNP (last 3 results) No results for input(s): PROBNP in the last 8760 hours. HbA1C: No results for input(s): HGBA1C in the last 72 hours. CBG: No results for input(s): GLUCAP in the last 168 hours. Lipid Profile: No results for input(s): CHOL, HDL, LDLCALC, TRIG, CHOLHDL, LDLDIRECT in the last 72 hours. Thyroid Function Tests: No results for input(s): TSH, T4TOTAL, FREET4, T3FREE, THYROIDAB in the last 72 hours. Anemia Panel: No results for input(s): VITAMINB12, FOLATE, FERRITIN, TIBC, IRON, RETICCTPCT in the last 72 hours. Urine analysis:    Component Value Date/Time   COLORURINE YELLOW 09/03/2020 1919   APPEARANCEUR HAZY (A)  09/03/2020 1919   APPEARANCEUR Clear 08/22/2018 1620   LABSPEC 1.016 09/03/2020 1919   LABSPEC 1.020 03/03/2014 1341   PHURINE 5.5 09/03/2020 1919   GLUCOSEU NEGATIVE 09/03/2020 1919   GLUCOSEU Negative 03/03/2014 1341   HGBUR NEGATIVE 09/03/2020 1919   BILIRUBINUR NEGATIVE 09/03/2020 1919   BILIRUBINUR Negative 08/22/2018 1620   BILIRUBINUR Negative 03/03/2014 1341   KETONESUR NEGATIVE 09/03/2020 1919   PROTEINUR 100 (A) 09/03/2020 1919   UROBILINOGEN 0.2 03/03/2014 1341   NITRITE NEGATIVE 09/03/2020 1919   LEUKOCYTESUR SMALL (A) 09/03/2020 1919   LEUKOCYTESUR Negative 03/03/2014 1341    Radiological Exams on Admission: CT ABDOMEN PELVIS WO CONTRAST  Result Date: 09/03/2020 CLINICAL DATA:  Nausea and vomiting. EXAM: CT ABDOMEN AND PELVIS WITHOUT CONTRAST TECHNIQUE: Multidetector CT imaging of the abdomen and pelvis was performed following the standard protocol without IV contrast. COMPARISON:  August 17, 2020 FINDINGS: Lower chest: Small hiatal hernia. Postsurgical changes in the right breast. Hepatobiliary: No focal liver abnormality is seen. Status post cholecystectomy. No biliary dilatation. Pancreas: Unremarkable. No pancreatic ductal dilatation or surrounding inflammatory changes. Spleen: Normal in size without focal abnormality. Adrenals/Urinary Tract: Adrenal glands are unremarkable. Kidneys are normal,  without renal calculi, focal lesion, or hydronephrosis. Bladder is unremarkable. Bilateral renal cysts. Stomach/Bowel: Stomach is within normal limits. Sequela of prior right ileocecal anastomosis. No evidence of bowel wall thickening, distention, or inflammatory changes. Vascular/Lymphatic: Aortic atherosclerosis. No enlarged abdominal or pelvic lymph nodes. Reproductive: Status post hysterectomy. No adnexal masses. Other: No abdominal wall hernia or abnormality. No abdominopelvic ascites. Musculoskeletal: No acute or significant osseous findings. IMPRESSION: 1. No evidence of acute  abnormalities within the abdomen or pelvis. 2. Small hiatal hernia. 3. Aortic atherosclerosis. Aortic Atherosclerosis (ICD10-I70.0). Electronically Signed   By: Fidela Salisbury M.D.   On: 09/03/2020 19:20    EKG: Independently reviewed.  Assessment/Plan Principal Problem:   Acute renal failure superimposed on stage 4 chronic kidney disease (HCC) Active Problems:   Chronic pain   Diabetic neuropathy associated with type 2 diabetes mellitus (HCC)   Essential hypertension   Chronic diastolic CHF (congestive heart failure) (HCC)   Nausea and vomiting    AKI on CKD 4 - ? Dehydration from N, poor PO intake, chronic torsemide for dCHF, and now new onset vomiting, as cause. Gentle hydration with IVF Strict intake and outpt Repeat BMP in AM Hold torsemide Nephro consult in AM most likely N/V, poor PO intake - CT AP again unimpressive Has GI f/u appointment on Monday per pt But given worsening condition without improvement, will send message in Epic for possible AM consult to [facility] Clear liquid diet dCHF - Hold home torsemide Cont home BP meds DM2 - Hold Tradjenta SSI sensitive Q4H HTN - Cont home BP meds CPS - Cont home dilaudid Cont lyrica when med rec completed Yeast infection - Starting mycelex  DVT prophylaxis: Eliquis Code Status: Full Family Communication: No family in room Disposition Plan: Home after AKI resolved, and N/V work up [employer] called: Message sent on Epic to Dr. Therisa Doyne Admission status: Admit to inpatient  Severity of Illness: The appropriate patient status for this patient is INPATIENT. Inpatient status is judged to be reasonable and necessary in order to provide the required intensity of service to ensure the patient's safety. The patient's presenting symptoms, physical exam findings, and initial radiographic and laboratory data in the context of their chronic comorbidities is felt to place them at high risk for further clinical deterioration.  Furthermore, it is not anticipated that the patient will be medically stable for discharge from the hospital within 2 midnights of admission. The following factors support the patient status of inpatient.   Patient has acute kidney injury.  Patient has one of the following: Increase in Serum Creatinine >0.3 mg/dL within 48h Increase in Serum Creatinine > 1.5 times baseline known or presumed to have been within the last 7 days Urine volume < 0.5 ml/kg/hr for 6 hours   * I certify that at the point of admission it is my clinical judgment that the patient will require inpatient hospital care spanning beyond 2 midnights from the point of admission due to high intensity of service, high risk for further deterioration and high frequency of surveillance required.*   Prescott Truex M. DO [facility]  How to contact the [facility] Attending or Consulting provider [place] or covering provider during after hours [place], for this patient?  Check the care team in [facility] and look for a) attending/consulting [facility] provider listed and b) the [facility] team listed Log into [email or web address]  Am[employer] Physician Scheduling and messaging for groups and whole hospitals  On call and physician scheduling software for group practices, residents,  hospitalists and other medical providers for call, clinic, rotation and shift schedules. On[employer] is a hospital-wide system for scheduling doctors and paging doctors on call. EasyPlot is for scientific plotting and data analysis.  [email or web address]  and use [place]'s universal password to access. If you do not have the password, please contact the hospital operator.  Locate the [facility] provider you are looking for under [facility] and page to a number that you can be directly reached. If you still have difficulty reaching the provider, please page the [facility] (Director on Call) for the Hospitalists listed on amion for assistance.  09/03/2020, 11:10 PM

## 2020-09-03 NOTE — ED Notes (Signed)
Patient transported to CT 

## 2020-09-04 LAB — HEPATIC FUNCTION PANEL
ALT: 32 U/L (ref 0–44)
AST: 37 U/L (ref 15–41)
Albumin: 3.1 g/dL — ABNORMAL LOW (ref 3.5–5.0)
Alkaline Phosphatase: 58 U/L (ref 38–126)
Bilirubin, Direct: <0.1 mg/dL (ref 0.0–0.2)
Total Bilirubin: 0.6 mg/dL (ref 0.3–1.2)
Total Protein: 6.6 g/dL (ref 6.5–8.1)

## 2020-09-04 LAB — GLUCOSE, CAPILLARY
Glucose-Capillary: 100 mg/dL — ABNORMAL HIGH (ref 70–99)
Glucose-Capillary: 116 mg/dL — ABNORMAL HIGH (ref 70–99)
Glucose-Capillary: 78 mg/dL (ref 70–99)
Glucose-Capillary: 78 mg/dL (ref 70–99)
Glucose-Capillary: 88 mg/dL (ref 70–99)

## 2020-09-04 LAB — BASIC METABOLIC PANEL
Anion gap: 9 (ref 5–15)
BUN: 45 mg/dL — ABNORMAL HIGH (ref 8–23)
CO2: 22 mmol/L (ref 22–32)
Calcium: 9 mg/dL (ref 8.9–10.3)
Chloride: 109 mmol/L (ref 98–111)
Creatinine, Ser: 2.76 mg/dL — ABNORMAL HIGH (ref 0.44–1.00)
GFR, Estimated: 18 mL/min — ABNORMAL LOW (ref >60)
Glucose, Bld: 79 mg/dL (ref 70–99)
Potassium: 3.2 mmol/L — ABNORMAL LOW (ref 3.5–5.1)
Sodium: 140 mmol/L (ref 135–145)

## 2020-09-04 LAB — CBC
HCT: 33.9 % — ABNORMAL LOW (ref 36.0–46.0)
Hemoglobin: 10.8 g/dL — ABNORMAL LOW (ref 12.0–15.0)
MCH: 27.6 pg (ref 26.0–34.0)
MCHC: 31.9 g/dL (ref 30.0–36.0)
MCV: 86.5 fL (ref 80.0–100.0)
Platelets: 230 10*3/uL (ref 150–400)
RBC: 3.92 MIL/uL (ref 3.87–5.11)
RDW: 15 % (ref 11.5–15.5)
WBC: 7.9 10*3/uL (ref 4.0–10.5)
nRBC: 0 % (ref 0.0–0.2)

## 2020-09-04 LAB — MAGNESIUM: Magnesium: 1.5 mg/dL — ABNORMAL LOW (ref 1.7–2.4)

## 2020-09-04 LAB — LIPASE, BLOOD: Lipase: 105 U/L — ABNORMAL HIGH (ref 11–51)

## 2020-09-04 MED ORDER — FAMOTIDINE 20 MG PO TABS
10.0000 mg | ORAL_TABLET | Freq: Every day | ORAL | Status: DC
  Administered 2020-09-04 – 2020-09-10 (×7): 10 mg via ORAL
  Filled 2020-09-04 (×7): qty 1

## 2020-09-04 MED ORDER — ONDANSETRON HCL 4 MG/2ML IJ SOLN
4.0000 mg | Freq: Three times a day (TID) | INTRAMUSCULAR | Status: DC | PRN
  Administered 2020-09-04 – 2020-09-07 (×4): 4 mg via INTRAVENOUS
  Filled 2020-09-04 (×4): qty 2

## 2020-09-04 MED ORDER — PANTOPRAZOLE SODIUM 40 MG PO TBEC
40.0000 mg | DELAYED_RELEASE_TABLET | Freq: Every day | ORAL | Status: DC
  Administered 2020-09-04 – 2020-09-10 (×7): 40 mg via ORAL
  Filled 2020-09-04 (×7): qty 1

## 2020-09-04 MED ORDER — ROSUVASTATIN CALCIUM 5 MG PO TABS
5.0000 mg | ORAL_TABLET | Freq: Every day | ORAL | Status: DC
  Administered 2020-09-04 – 2020-09-10 (×7): 5 mg via ORAL
  Filled 2020-09-04 (×7): qty 1

## 2020-09-04 MED ORDER — LORAZEPAM 2 MG/ML IJ SOLN
0.2500 mg | Freq: Four times a day (QID) | INTRAMUSCULAR | Status: DC | PRN
  Administered 2020-09-04 – 2020-09-05 (×2): 0.25 mg via INTRAVENOUS
  Filled 2020-09-04 (×2): qty 1

## 2020-09-04 NOTE — Progress Notes (Addendum)
PROGRESS NOTE    [name]  WLS:[id number] DOB: May 07, 1945 DOA: 09/03/2020 PCP: Katie Carol, MD   Chief Complaint  Patient presents with   Nausea   Emesis    Brief Narrative:  [name] is [name] 75 y.o. female with medical history significant of DM2, HTN, CKD 4, HFpEF with grade 1 DD, chronic pain, Breast CA in remission.  Prior ? Of Crohn's dz in 2003.  She's had about 1 month of nausea and began throwing up the day prior to admission.  She was started on PPI and budesonide by GI which has not helped.  Admitted for AKI, persistent nausea, vomiting.  See below for additional details   Assessment & Plan:   Principal Problem:   Acute renal failure superimposed on stage 4 chronic kidney disease (HCC) Active Problems:   Chronic pain   Diabetic neuropathy associated with type 2 diabetes mellitus (HCC)   Essential hypertension   Chronic diastolic CHF (congestive heart failure) (HCC)   Nausea and vomiting  AKI on CKD 4 - ? Dehydration from N, poor PO intake, chronic torsemide for dCHF, and now new onset vomiting, as cause. Gentle hydration with IVF Strict intake and outpt Repeat BMP in AM - creatinine improving Hold torsemide No hydro on CT scan Given improvement near baseline, ok to hold off on renal c/s  N/V, poor PO intake  Mildly Elevated Lipase CT AP again unimpressive Lipase elevated, downtrending, but CT without evidence of pancreatitis on imaging GI c/s, appreciate recs - planning for gastric emptying study Family notes hx of pancreatic cancer in family - consider additional imaging if workup unrevealing or persistent symptoms, but no clear indication for imaging at this time Clear liquid diet Continue PPI, pepcid for now Ativan for nausea, zofran as well cautiously - repeat EKG for QT eval   Prolonged Qtc Repeat EKG, follow  telemetry  dCHF - Hold home torsemide Cont home BP meds (amlodipine, hydralazine, imdur, bystolic)  DM2 - Hold  Tradjenta SSI sensitive Q4H  HTN - Cont home BP meds (amlodipine, hydralazine, imdur, bystolic)  CPS - Daughter says she's no longer taking dilaudid or lyrica   Yeast [name] - Starting mycelex Follow urine culture    DVT prophylaxis: eliquis Code Status: full  Family Communication: (none at bedside Disposition:   Status is: Inpatient  Remains inpatient appropriate because:Inpatient level of care appropriate due to severity of illness  Dispo: The patient is from: Home              Anticipated d/c is to: Home              Patient currently is not medically stable to d/c.   Difficult to place patient No       Consultants:  GI  Procedures:  none  Antimicrobials:  Anti-infectives (From admission, onward)    None          Subjective: No new complaints Notes persistent nausea Mild epigastric discomfort  Objective: Vitals:   09/03/20 2219 09/04/20 0226 09/04/20 0650 09/04/20 1014  BP: (!) 144/88 129/63 (!) 154/67 (!) 154/67  Pulse: 83 81 83   Resp: 16 17 17    Temp: 98.2 F (36.8 C) 97.9 F (36.6 C) 98 F (36.7 C)   TempSrc: Oral Oral Oral   SpO2: 97% 99% 98%   Weight: 69.7 kg     Height: 5' 4"  (1.626 m)       Intake/Output Summary (Last 24 hours) at 09/04/2020 1301 Last data  filed at 09/04/2020 0600 Gross per 24 hour  Intake 503 ml  Output --  Net 503 ml   Filed Weights   09/03/20 1557 09/03/20 2219  Weight: 68.3 kg 69.7 kg    Examination:  General exam: Appears calm and comfortable  Respiratory system: Clear to auscultation. Respiratory effort normal. Cardiovascular system: S1 & S2 heard, RRR Gastrointestinal system:mild epigastric TTP Central nervous system: Alert and oriented. No focal neurological deficits. Extremities: no [name]. Skin: No rashes, lesions or ulcers Psychiatry: Judgement and insight appear normal. Mood & affect appropriate.     Data Reviewed: I have personally reviewed following labs and imaging  studies  CBC: Recent Labs  Lab 09/03/20 1919 09/04/20 0646  WBC 9.9 7.9  NEUTROABS 7.0  --   HGB 12.2 10.8*  HCT 38.1 33.9*  MCV 84.7 86.5  PLT 286 354    Basic Metabolic Panel: Recent Labs  Lab 09/03/20 1919 09/04/20 0646  NA 138 140  K 3.8 3.2*  CL 103 109  CO2 21* 22  GLUCOSE 105* 79  BUN 46* 45*  CREATININE 3.56* 2.76*  CALCIUM 10.0 9.0    GFR: Estimated Creatinine Clearance: 17.4 mL/min ([name]) (by C-G formula based on SCr of 2.76 mg/dL (H)).  Liver Function Tests: Recent Labs  Lab 09/03/20 1919 09/04/20 1135  AST 31 37  ALT 28 32  ALKPHOS 63 58  BILITOT 0.5 0.6  PROT 7.7 6.6  ALBUMIN 3.9 3.1*    CBG: Recent Labs  Lab 09/04/20 0040 09/04/20 0423 09/04/20 0759 09/04/20 1155  GLUCAP 88 78 78 116*     Recent Results (from the past 240 hour(s))  Resp Panel by RT-PCR (Flu [name], Covid) Nasopharyngeal Swab     Status: None   Collection Time: 09/03/20  8:09 PM   Specimen: Nasopharyngeal Swab; Nasopharyngeal(NP) swabs in vial transport medium  [name] Value Ref Range Status   SARS Coronavirus 2 by RT PCR [name] [name] Final    Comment: (NOTE) SARS-CoV-2 target nucleic acids are NOT DETECTED.  The SARS-CoV-2 RNA is generally detectable in upper respiratory specimens during the acute phase of [name]. The lowest concentration of SARS-CoV-2 viral copies this assay can detect is 138 copies/mL. [name] and should not be used as the sole basis for treatment or other patient management decisions. Katie [name] may occur with  improper specimen [name], submission of specimen other than nasopharyngeal swab, presence of viral mutation(s) within the areas targeted by this assay, and inadequate number of viral copies(<138 copies/mL). Danzig Macgregor [name] [name] must be combined with clinical [name], patient history, and epidemiological information. The expected [name] is [name].  Fact Sheet  for Patients:  EntrepreneurPulse.com.au  Fact Sheet for Healthcare Providers:  IncredibleEmployment.be  This test is no t yet approved or cleared by the [place] FDA and  has been authorized for detection and/or diagnosis of SARS-CoV-2 by FDA under an Emergency Use Authorization (EUA). This EUA will remain  in effect (meaning this test can be used) for the duration of the COVID-19 declaration under Section 564(b)(1) of the Act, 21 U.S.C.section 360bbb-3(b)(1), unless the authorization is terminated  or revoked sooner.       Influenza Destani Wamser by PCR [name] [name] Final   Influenza B by PCR [name] [name] Final    Comment: (NOTE) The Xpert Xpress SARS-CoV-2/FLU/RSV plus assay is intended as an aid in the diagnosis of influenza from Nasopharyngeal swab specimens and should not be used as Cortina Vultaggio sole basis for  treatment. Nasal washings and aspirates are unacceptable for Xpert Xpress SARS-CoV-2/FLU/RSV testing.  Fact Sheet for Patients: EntrepreneurPulse.com.au  Fact Sheet for Healthcare Providers: IncredibleEmployment.be  This test is not yet approved or cleared by the [place] FDA and has been authorized for detection and/or diagnosis of SARS-CoV-2 by FDA under an Emergency Use Authorization (EUA). This EUA will remain in effect (meaning this test can be used) for the duration of the COVID-19 declaration under Section 564(b)(1) of the Act, 21 U.S.C. section 360bbb-3(b)(1), unless the authorization is terminated or revoked.  Performed at [facility], [address] [place], [place] [address]          Radiology Studies: CT ABDOMEN PELVIS WO CONTRAST  [name] Date: 09/03/2020 CLINICAL DATA:  Nausea and vomiting. EXAM: CT ABDOMEN AND PELVIS WITHOUT CONTRAST TECHNIQUE: Multidetector CT imaging of the abdomen and pelvis was performed following the standard protocol without IV  contrast. COMPARISON:  August 17, 2020 FINDINGS: Lower chest: Small hiatal hernia. Postsurgical changes in the right breast. Hepatobiliary: No focal liver abnormality is seen. Status post cholecystectomy. No biliary dilatation. Pancreas: Unremarkable. No pancreatic ductal dilatation or surrounding inflammatory changes. Spleen: Normal in size without focal abnormality. Adrenals/Urinary Tract: Adrenal glands are unremarkable. Kidneys are normal, without renal calculi, focal lesion, or hydronephrosis. Bladder is unremarkable. Bilateral renal cysts. Stomach/Bowel: Stomach is within normal limits. Sequela of prior right ileocecal anastomosis. No evidence of bowel wall thickening, distention, or inflammatory changes. Vascular/Lymphatic: Aortic atherosclerosis. No enlarged abdominal or pelvic lymph nodes. Reproductive: Status post hysterectomy. No adnexal masses. Other: No abdominal wall hernia or abnormality. No abdominopelvic ascites. Musculoskeletal: No acute or significant osseous findings. IMPRESSION: 1. No evidence of acute abnormalities within the abdomen or pelvis. 2. Small hiatal hernia. 3. Aortic atherosclerosis. Aortic Atherosclerosis (ICD10-I70.0). Electronically Signed   By: Fidela Salisbury M.D.   On: 09/03/2020 19:20        Scheduled Meds:  amLODipine  10 mg Oral Daily   apixaban  5 mg Oral BID   budesonide  3 mg Oral Daily   clotrimazole  10 mg Oral 5 X Daily   Ferrous Fumarate  318 mg of iron Oral Daily   hydrALAZINE  100 mg Oral TID   insulin aspart  0-9 Units Subcutaneous Q4H   isosorbide mononitrate  15 mg Oral Daily   melatonin  10 mg Oral QHS   nebivolol  10 mg Oral Daily   Continuous Infusions:  lactated ringers 100 mL/hr at 09/04/20 0552     LOS: 1 day    Time spent: over 30 min    Fayrene Helper, MD [facility]   To contact the attending provider between 7A-7P or the covering provider during after hours 7P-7A, please log into the web site [email or web address] and  access using universal [place] password for that web site. If you do not have the password, please call the hospital operator.  09/04/2020, 1:01 PM

## 2020-09-04 NOTE — TOC Initial Note (Signed)
Transition of Care [facility]) - Initial/Assessment Note    Patient Details  Name: [name] MRN: [id number] Date of Birth: 1945/04/16  [facility]) CM/SW Contact:    Iona Beard, [place] Phone Number: 09/04/2020, 12:49 PM  Clinical Narrative:                 Pt is high risk for readmission. CSW spoke with pt to complete assessment. Pt states that she lives alone and is independent in completing her ADLs. Pt also states that she provides her own transportation when needed. Pt has not had [place] services in a "long time". Pt states that she has a cane and walker but does not currently use them. TOC to follow.   Expected Discharge Plan: Home/Self Care Barriers to Discharge: Continued Medical Work up   Patient Goals and CMS Choice Patient states their goals for this hospitalization and ongoing recovery are:: Return home CMS Medicare.gov Compare Post Acute Care list provided to:: Patient Choice offered to / list presented to : Patient  Expected Discharge Plan and Services Expected Discharge Plan: Home/Self Care In-house Referral: NA Discharge Planning Services: NA Post Acute Care Choice: NA Living arrangements for the past 2 months: Single Family Home                 DME Arranged: N/A DME Agency: NA       HH Arranged: NA [place] Agency: NA        Prior Living Arrangements/Services Living arrangements for the past 2 months: Single Family Home Lives with:: Self Patient language and need for interpreter reviewed:: Yes Do you feel safe going back to the place where you live?: Yes      Need for Family Participation in Patient Care: No (Comment) Care giver support system in place?: Yes (comment) Current home services: DME (walker and cane) Criminal Activity/Legal Involvement Pertinent to Current Situation/Hospitalization: No - Comment as needed  Activities of Daily Living Home Assistive Devices/Equipment: Dentures (specify type), Walker (specify type) (upper) ADL  Screening (condition at time of admission) Patient's cognitive ability adequate to safely complete daily activities?: Yes Is the patient deaf or have difficulty hearing?: No Does the patient have difficulty seeing, even when wearing glasses/contacts?: No Does the patient have difficulty concentrating, remembering, or making decisions?: No Patient able to express need for assistance with ADLs?: Yes Does the patient have difficulty dressing or bathing?: No Independently performs ADLs?: Yes (appropriate for developmental age) Does the patient have difficulty walking or climbing stairs?: Yes Weakness of Legs: Both Weakness of Arms/Hands: None  Permission Sought/Granted                  Emotional Assessment Appearance:: Appears stated age Attitude/Demeanor/Rapport: Engaged Affect (typically observed): Accepting Orientation: : Oriented to Self, Oriented to  Time, Oriented to Situation, Oriented to Place Alcohol / Substance Use: Not Applicable Psych Involvement: No (comment)  Admission diagnosis:  Acute renal failure superimposed on stage 4 chronic kidney disease (HCC) [N17.9, N18.4] Acute kidney injury superimposed on chronic kidney disease ([place]) [N17.9, N18.9] Patient Active Problem List   Diagnosis Date Noted   Acute renal failure superimposed on stage 4 chronic kidney disease ([place]) 09/03/2020   Chronic diastolic CHF (congestive heart failure) ([place]) 09/03/2020   Nausea and vomiting 09/03/2020   CHF (congestive heart failure) ([place]) 07/20/2020   Dyspnea 06/28/2020   Chronic diarrhea - responds to budesonide 09/10/2019   Iron deficiency anemia 08/19/2018   Spondylosis of lumbar spine 12/24/2017  Renal vein thrombosis ([place]) 09/17/2017   Hoarse voice quality 09/09/2017   Low back pain without sciatica 09/09/2017   AKI (acute kidney injury) ([place]) 08/25/2017   Labile blood glucose    Sleep disturbance    Leukocytosis    Essential hypertension    Urinary frequency    Type 2  diabetes mellitus with peripheral neuropathy (HCC)    Benign essential HTN    Stage 2 chronic kidney disease    Dysphagia    Debility    Encephalopathy    Pressure injury of skin 08/04/2017   Pneumonia of left lung due to infectious organism    Diffuse pulmonary alveolar hemorrhage    SOB (shortness of breath) 10/11/2016   Hyperparathyroidism, secondary renal ([place]) 05/02/2016   Gout 04/11/2015   Diabetic neuropathy associated with type 2 diabetes mellitus ([place]) 01/06/2015   SBO (small bowel obstruction) ([place]) 09/15/2013   Breast cancer of lower-inner quadrant of right female breast ([place]) 03/18/2013   Chronic venous insufficiency 01/06/2013   Health care maintenance 05/08/2011   Diabetes mellitus type 2 in obese ([place]) 05/03/2010   Seasonal allergies 05/03/2010   Labile hypertension    CAD (coronary artery disease)    Abdominal discomfort    Anemia in chronic kidney disease    History of stroke without residual deficits    Hyperlipidemia associated with type 2 diabetes mellitus (HCC)    Chronic pain    Chronic kidney disease, stage III (moderate) ([place])    Adnexal mass    Spinal stenosis, lumbar region, with neurogenic claudication 01/03/2009   PCP:  Seward Carol, MD Pharmacy:   [facility] [place] [address] [place] ([place]) [place] [address] Phone: [phone] Fax: [phone]  [facility]  ([place], [place], [address] [place], [address] [place] [address] Phone: [phone] Fax: [phone]     Social Determinants of Health (SDOH) Interventions    Readmission Risk Interventions Readmission Risk Prevention Plan 09/04/2020  Transportation Screening Complete  Medication Review (RN Care Manager) Complete  HRI or [place] Complete  SW Recovery Care/Counseling Consult Complete  Palliative Care Screening Not [place]  Not Applicable  Some recent data might be hidden

## 2020-09-04 NOTE — Progress Notes (Signed)
Pt gastric emptying study to be attempted Tuesday 21,2022 as long as patient is no longer receiving opiates . PT must be off opiates 2 days prior to study.   PT must also be NPO and have no stomach altering medication after 12 PM on Monday night.

## 2020-09-04 NOTE — Consult Note (Addendum)
[facility] Consult  Referring Provider: [facility] hospitalist Primary Care Physician:  Seward Carol, MD Primary Gastroenterologist: Dr. Alessandra Bevels  Reason for Consultation: History of Crohn's, nausea vomiting, dehydration  HPI: [name] is a 75 y.o. female was in her usual state of health until 3 days ago when she developed vomiting. History is obtained from the patient as well as her daughter present at bedside. Patient reports chronic nausea, however for the last 3 days, she has also had multiple episodes of bilious vomiting, is not able to eat which prompted her to come to the ER. Patient has lost about 28 pounds since mid May. She complains of early satiety, some difficulty and pain on swallowing and constant nausea. She has mild generalized nonspecific abdominal pain. She was seen in the office in March for diarrhea and recommended to take colestipol, however she is no longer has diarrhea, in fact has not had a bowel movement in 2 days, denies noticing blood in stool or black stools.   In 2003, she had a right hemicolectomy, surgical specimen did not show evidence of Crohn's. In the past as per office documentation, she has not responded well to Entocort, prednisone or Asacol, however was found to have positive ASCA and suspected to have Crohn's. EGD in 6/21 showed esophagitis and a small hiatal hernia. Colonoscopy from 04/2018 performed for rectal bleeding showed external and internal hemorrhoids, normal-appearing ileocolonic anastomosis.    Past Medical History:  Diagnosis Date   Abdominal discomfort    Chronic N/V/D. Presumptive dx Crohn's dx per elevated p ANCA. Failed Entocort and Pentasa. Sep 2003 - ileocolectomy c anastomosis per Dr Deon Pilling 2/2 adhesions - path was hegative for Crohns. EGD, Sm bowel follow through (11/03), and an eteroclysis (10/03) were unrevealing. Cuases hypomag and hypocalcemia.   Adnexal mass 8/03   s/p lap BSO (R ovarian fibroma) &  lysis of adhesions   Allergy    Seasonal   Anemia    Multifactorial. Baseline HgB 10-11 ish. B12 def - 150 in 3/10. Fe Def - ferritin 35 3/10. Both are being repleted.   Breast cancer ([place]) 03/16/13   right, 5 o'clock   CAD (coronary artery disease) 1996   1996 - PTCA and angioplasty diagonal branch. 2000 - Rotoblator & angiopllasty of diagonal. 2006 - subendocardial AMI, DES to proximal LAD.Marland Kitchen Also had 90% stenosis in distal apical LAD. EF 55 with apical hypokinesis. Indefinite ASA and Plavix.   CHF (congestive heart failure) (HCC)    Chronic kidney disease    Chronic renal insuff baseline Cr 1.2 - 1.4 ish.   Chronic pain    CT 10/10 = Spinal stenosis L2 - S1.   Diabetes mellitus    Insulin dependent   Gout    Hx of radiation therapy 06/02/13- 07/16/13   right rbeast 4500 cGy 25 sessions, right breast boost 1600 cGy in 8 sessions   Hyperlipidemia    Managed with both a statin and Welchol. Welchol stopped 2014 2/2 cost and started on fenofibrate    Hypertension    2006 B renal arteries patent. 2003 MRA - no RAS. 2003 pheo W/U Dr Hassell Done reportedly negative.   Hypoxia 07/23/2017   Lupus ([place])    Lymphedema of breast    Personal history of radiation therapy 2015   RBBB    Renal vein thrombosis (HCC)    SBO (small bowel obstruction) ([place]) 09/17/2017   Secondary hyperparathyroidism ([place])    Stroke [facility])    Incidental finding MRI 2002 L lacunar  infarct   Vitamin B12 deficiency    Vitamin D deficiency    Wears dentures    top    Past Surgical History:  Procedure Laterality Date   ABDOMINAL HYSTERECTOMY     BILATERAL SALPINGOOPHORECTOMY  8/03   Lap BSO (R ovarian fibroma) and adhesion lysis   BOWEL RESECTION  2003   ileocolectomy with anastomosis 2/2 adhesions   BREAST BIOPSY Right 2015   BREAST BIOPSY Right 07/2014   BREAST LUMPECTOMY Right 04/22/2013   BREAST LUMPECTOMY WITH NEEDLE LOCALIZATION AND AXILLARY SENTINEL LYMPH NODE BX Right 04/22/2013   Procedure: BREAST LUMPECTOMY  WITH NEEDLE LOCALIZATION AND AXILLARY SENTINEL LYMPH NODE BX;  Surgeon: Stark Klein, MD;  Location: [facility];  Service: General;  Laterality: Right;   CARDIAC CATHETERIZATION     2 stents   CHOLECYSTECTOMY     COLONOSCOPY     HEMICOLECTOMY     R sided hemicolectomy   HERNIA REPAIR     Ventral hernia repair   PTCA  4/06    Prior to Admission medications   Medication Sig Start Date End Date Taking? Authorizing Provider  allopurinol (ZYLOPRIM) 100 MG tablet Take 0.5 tablets (50 mg total) by mouth every other day. 07/01/20  Yes Mercy Riding, MD  amLODipine (NORVASC) 10 MG tablet Take 1 tablet (10 mg total) by mouth daily. 08/22/17  Yes Angiulli, Lavon Paganini, PA-C  apixaban (ELIQUIS) 5 MG TABS tablet Take 1 tablet (5 mg total) by mouth 2 (two) times daily. Start after you finish the starter pack 07/24/20  Yes Vann, Jessica U, DO  clotrimazole (MYCELEX) 10 MG troche Take 10 mg by mouth 5 (five) times daily. Yeast in urine, started today   Yes [provider]  Cyanocobalamin (B-12 COMPLIANCE INJECTION IJ) Inject 1 vial as directed every 30 (thirty) days.   Yes [provider]  Epoetin Alfa-epbx (RETACRIT IJ) Inject 1 vial as directed every 30 (thirty) days.   Yes [provider]  ergocalciferol (VITAMIN D2) 1.25 MG (50000 UT) capsule Take 1 capsule (50,000 Units total) by mouth once a week. 08/02/20  Yes Truitt Merle, MD  ferrous fumarate (HEMOCYTE - 106 MG FE) 325 (106 Fe) MG TABS tablet Take 325 mg of iron by mouth daily.   Yes [provider]  hydrALAZINE (APRESOLINE) 100 MG tablet TAKE 1 TABLET BY MOUTH 3  TIMES DAILY Patient taking differently: Take 100 mg by mouth 3 (three) times daily. 01/20/20  Yes Josue Hector, MD  HYDROmorphone (DILAUDID) 2 MG tablet Take 1 tablet (2 mg total) by mouth every 6 (six) hours as needed for severe pain. 07/14/20  Yes Corena Herter, PA-C  isosorbide mononitrate (IMDUR) 30 MG 24 hr tablet TAKE 1/2 (ONE-HALF) TABLET  BY MOUTH ONCE DAILY (PLEASE  SCHEDULE  AN  APPOINTMENT  FOR  FURTHER  REFILLS) Patient taking differently: Take 15 mg by mouth daily. 07/12/20  Yes Isaiah Serge, NP  linagliptin (TRADJENTA) 5 MG TABS tablet Take 5 mg by mouth daily.   Yes [provider]  Melatonin 5 MG TABS Take 10 mg by mouth at bedtime.   Yes [provider]  nebivolol (BYSTOLIC) 10 MG tablet Take 1 tablet (10 mg total) by mouth daily. 06/30/20  Yes Mercy Riding, MD  nitroGLYCERIN (NITROSTAT) 0.4 MG SL tablet DISSOLVE 1 TABLET UNDER THE TONGUE EVERY 5 MINUTES AS  NEEDED FOR CHEST PAIN. MAX  OF 3 TABLETS IN 15 MINUTES. CALL 911 IF PAIN PERSISTS.  Patient taking differently: Place 0.4 mg under the tongue every 5 (five) minutes as needed for chest pain. 10/28/19  Yes Josue Hector, MD  ondansetron (ZOFRAN ODT) 4 MG disintegrating tablet Take 1 tablet (4 mg total) by mouth every 8 (eight) hours as needed for nausea or vomiting. 08/17/20  Yes Walisiewicz, Kaitlyn E, PA-C  pantoprazole (PROTONIX) 40 MG tablet TAKE 1 TABLET BY MOUTH  DAILY BEFORE BREAKFAST Patient taking differently: Take 40 mg by mouth daily. 08/11/20  Yes Gatha Mayer, MD  pregabalin (LYRICA) 25 MG capsule Take 1 capsule (25 mg total) by mouth 2 (two) times daily. 07/19/20  Yes Kirsteins, Luanna Salk, MD  rosuvastatin (CRESTOR) 5 MG tablet Take 5 mg by mouth daily.   Yes [provider]  torsemide (DEMADEX) 20 MG tablet Take 2 tablets (40 mg total) by mouth daily. 07/11/20  Yes Josue Hector, MD  budesonide (ENTOCORT EC) 3 MG 24 hr capsule Take 1 capsule by mouth once daily Patient taking differently: Take 3 mg by mouth daily. 04/06/20   Gatha Mayer, MD    Current Facility-Administered Medications  Medication Dose Route Frequency Provider Last Rate Last Admin   acetaminophen (TYLENOL) tablet 650 mg  650 mg Oral Q6H PRN Etta Quill, DO       Or   acetaminophen (TYLENOL) suppository 650 mg  650 mg Rectal Q6H PRN Etta Quill, DO        amLODipine (NORVASC) tablet 10 mg  10 mg Oral Daily Alcario Drought, Jared M, DO       apixaban [facility]) tablet 5 mg  5 mg Oral BID Jennette Kettle M, DO   5 mg at 09/03/20 2310   budesonide (ENTOCORT EC) 24 hr capsule 3 mg  3 mg Oral Daily Jennette Kettle M, DO       clotrimazole [facility]) troche 10 mg  10 mg Oral 5 X Daily Jennette Kettle M, DO   10 mg at 09/04/20 0550   ferrous fumarate (HEMOCYTE - 106 mg FE) tablet 318 mg of iron  318 mg of iron Oral Daily Jennette Kettle M, DO       hydrALAZINE (APRESOLINE) tablet 100 mg  100 mg Oral TID Etta Quill, DO   100 mg at 09/03/20 2310   HYDROmorphone (DILAUDID) tablet 2 mg  2 mg Oral Q6H PRN Etta Quill, DO       insulin aspart (novoLOG) injection 0-9 Units  0-9 Units Subcutaneous Q4H Alcario Drought, Jared M, DO       isosorbide mononitrate (IMDUR) 24 hr tablet 15 mg  15 mg Oral Daily Alcario Drought, Jared M, DO       lactated ringers infusion   Intravenous Continuous Sherwood Gambler, MD 100 mL/hr at 09/04/20 0552 New Bag at 09/04/20 0552   melatonin tablet 10 mg  10 mg Oral QHS Etta Quill, DO   10 mg at 09/03/20 2310   nebivolol (BYSTOLIC) tablet 10 mg  10 mg Oral Daily Jennette Kettle M, DO       ondansetron [facility]) tablet 4 mg  4 mg Oral Q6H PRN Etta Quill, DO       Or   ondansetron [facility]) injection 4 mg  4 mg Intravenous Q6H PRN Etta Quill, DO        Allergies as of 09/03/2020 - Review Complete 09/03/2020  Allergen Reaction Noted   Percocet [oxycodone-acetaminophen] Other (See Comments) 05/05/2013   Diazepam Other (See Comments) 04/17/2006   Haloperidol lactate Nausea  And Vomiting    Lorazepam Nausea Only 07/08/2020   Morphine sulfate Other (See Comments) 04/17/2006   Propoxyphene hcl Nausea And Vomiting 05/16/2007   Tramadol hcl Nausea And Vomiting    Anesthetics, halogenated  10/25/2017   Etomidate  05/05/2018   Fentanyl  05/05/2018   Haloperidol  07/08/2020   Morphine sulfate  07/08/2020   Oxycodone-acetaminophen   07/08/2020   Propoxyphene  07/08/2020   Tramadol hcl  06/10/2020   Versed [midazolam]  05/05/2018    Family History  Problem Relation Age of Onset   Pancreatic cancer Brother 97   Breast cancer Mother 9   Lung cancer Maternal Aunt    Breast cancer Maternal Aunt    Prostate cancer Maternal Uncle    Heart attack Maternal Grandmother    Breast cancer Maternal Grandmother    Colon cancer Maternal Grandmother    Clotting disorder Maternal Grandmother    Diabetes Maternal Grandmother    Kidney failure Maternal Aunt    Hypertension Daughter     Social History   Socioeconomic History   Marital status: Legally Separated    Spouse name: Not on file   Number of children: 5   Years of education: Not on file   Highest education level: Not on file  Occupational History   Occupation: retired    Fish farm manager: [employer]  Tobacco Use   Smoking status: Never   Smokeless tobacco: Never  Vaping Use   Vaping Use: Never used  Substance and Sexual Activity   Alcohol use: No    Alcohol/week: 0.0 standard drinks   Drug use: No   Sexual activity: Not on file  Other Topics Concern   Not on file  Social History Narrative   She is separated 2 sons 3 daughters   She has grandchildren and great-grandchildren   She is retired   Never smoker no tobacco now no drugs no alcohol no caffeine   Social Determinants of Radio broadcast assistant Strain: Not on file  Food Insecurity: Not on file  Transportation Needs: Not on file  Physical Activity: Not on file  Stress: Not on file  Social Connections: Not on file  Intimate Partner Violence: Not on file    Review of Systems: Positive for: GI: Described in detail in HPI.    Gen: anorexia, fatigue, weakness, malaise, involuntary weight loss, denies any fever, chills, rigors, night sweats and sleep disorder CV: Denies chest pain, angina, palpitations, syncope, orthopnea, PND, peripheral edema, and claudication. Resp: Denies dyspnea,  cough, sputum, wheezing, coughing up blood. GU : Denies urinary burning, blood in urine, urinary frequency, urinary hesitancy, nocturnal urination, and urinary incontinence. MS: Denies joint pain or swelling.  Denies muscle weakness, cramps, atrophy.  Derm: Denies rash, itching, oral ulcerations, hives, unhealing ulcers.  Psych: Denies depression, anxiety, memory loss, suicidal ideation, hallucinations,  and confusion. Heme: Denies bruising, bleeding, and enlarged lymph nodes. Neuro:  Denies any headaches, dizziness, paresthesias. Endo:  DM, Denies any problems with thyroid, adrenal function.  Physical Exam: Vital signs in last 24 hours: Temp:  [97.9 F (36.6 C)-98.2 F (36.8 C)] 98 F (36.7 C) (06/19 0650) Pulse Rate:  [77-84] 83 (06/19 0650) Resp:  [12-19] 17 (06/19 0650) BP: (129-175)/(63-88) 154/67 (06/19 0650) SpO2:  [97 %-100 %] 98 % (06/19 0650) Weight:  [68.3 kg-69.7 kg] 69.7 kg (06/18 2219) Last BM Date: 09/01/20  General:   Ill-appearing Head:  Normocephalic and atraumatic. Eyes:  Sclera clear, no icterus.   Conjunctiva pink. Ears:  Normal auditory acuity. Nose:  No deformity, discharge,  or lesions. Mouth:  No deformity or lesions.  Oropharynx pink & moist. Neck:  Supple; no masses or thyromegaly. Lungs:  Clear throughout to auscultation.   No wheezes, crackles, or rhonchi. No acute distress. Heart:  Regular rate and rhythm; no murmurs, clicks, rubs,  or gallops. Extremities:  Without clubbing or edema. Neurologic:  Alert and  oriented x4;  grossly normal neurologically. Skin:  Intact without significant lesions or rashes. Psych:  Alert and cooperative. Normal mood and affect. Abdomen:  Soft, nontender and nondistended.  Well-healed surgical scars noted    Lab Results: Recent Labs    09/03/20 1919 09/04/20 0646  WBC 9.9 7.9  HGB 12.2 10.8*  HCT 38.1 33.9*  PLT 286 230   BMET Recent Labs    09/03/20 1919 09/04/20 0646  NA 138 140  K 3.8 3.2*  CL 103  109  CO2 21* 22  GLUCOSE 105* 79  BUN 46* 45*  CREATININE 3.56* 2.76*  CALCIUM 10.0 9.0   LFT Recent Labs    09/03/20 1919  PROT 7.7  ALBUMIN 3.9  AST 31  ALT 28  ALKPHOS 63  BILITOT 0.5   PT/INR No results for input(s): LABPROT, INR in the last 72 hours.  Studies/Results: CT ABDOMEN PELVIS WO CONTRAST  Result Date: 09/03/2020 CLINICAL DATA:  Nausea and vomiting. EXAM: CT ABDOMEN AND PELVIS WITHOUT CONTRAST TECHNIQUE: Multidetector CT imaging of the abdomen and pelvis was performed following the standard protocol without IV contrast. COMPARISON:  August 17, 2020 FINDINGS: Lower chest: Small hiatal hernia. Postsurgical changes in the right breast. Hepatobiliary: No focal liver abnormality is seen. Status post cholecystectomy. No biliary dilatation. Pancreas: Unremarkable. No pancreatic ductal dilatation or surrounding inflammatory changes. Spleen: Normal in size without focal abnormality. Adrenals/Urinary Tract: Adrenal glands are unremarkable. Kidneys are normal, without renal calculi, focal lesion, or hydronephrosis. Bladder is unremarkable. Bilateral renal cysts. Stomach/Bowel: Stomach is within normal limits. Sequela of prior right ileocecal anastomosis. No evidence of bowel wall thickening, distention, or inflammatory changes. Vascular/Lymphatic: Aortic atherosclerosis. No enlarged abdominal or pelvic lymph nodes. Reproductive: Status post hysterectomy. No adnexal masses. Other: No abdominal wall hernia or abnormality. No abdominopelvic ascites. Musculoskeletal: No acute or significant osseous findings. IMPRESSION: 1. No evidence of acute abnormalities within the abdomen or pelvis. 2. Small hiatal hernia. 3. Aortic atherosclerosis. Aortic Atherosclerosis (ICD10-I70.0). Electronically Signed   By: Fidela Salisbury M.D.   On: 09/03/2020 19:20    Impression: Nausea and vomiting-possible diabetic gastroparesis Unintentional weight loss History of right hemicolectomy, suspected Crohn's  due to positive ASCA  Multiple comorbidities including diabetes Worsening renal impairment on admission, BUN 46/creatinine 3.56, history of chronic kidney disease stage IV History of CHF, CAD History of left DVT on Eliquis History of pulmonary hypertension  Labs show baseline hemoglobin after rehydration, hypokalemia, normal LFTs, slightly elevated lipase of 186(normal up to 51) CT showed no acute abnormalities in abdomen or pelvis, small hiatal hernia, ileocecal anastomosis without bowel thickening, distention inflammatory change, postcholecystectomy  Plan: Recommend gastric emptying scan to evaluate for gastroparesis which could be the reason for underlying nausea, vomiting, early satiety, unintentional weight loss  Patient on budesonide 3 mg daily, prior pathology do not show Crohn's, suspected Crohn's based on positive ASCA   LOS: 1 day   Ronnette Juniper, MD  09/04/2020, 9:03 AM

## 2020-09-05 LAB — GLUCOSE, CAPILLARY
Glucose-Capillary: 103 mg/dL — ABNORMAL HIGH (ref 70–99)
Glucose-Capillary: 108 mg/dL — ABNORMAL HIGH (ref 70–99)
Glucose-Capillary: 113 mg/dL — ABNORMAL HIGH (ref 70–99)
Glucose-Capillary: 119 mg/dL — ABNORMAL HIGH (ref 70–99)
Glucose-Capillary: 85 mg/dL (ref 70–99)
Glucose-Capillary: 86 mg/dL (ref 70–99)
Glucose-Capillary: 93 mg/dL (ref 70–99)

## 2020-09-05 LAB — CBC WITH DIFFERENTIAL/PLATELET
Abs Immature Granulocytes: 0.03 10*3/uL (ref 0.00–0.07)
Basophils Absolute: 0 10*3/uL (ref 0.0–0.1)
Basophils Relative: 0 %
Eosinophils Absolute: 0 10*3/uL (ref 0.0–0.5)
Eosinophils Relative: 0 %
HCT: 34.3 % — ABNORMAL LOW (ref 36.0–46.0)
Hemoglobin: 10.9 g/dL — ABNORMAL LOW (ref 12.0–15.0)
Immature Granulocytes: 0 %
Lymphocytes Relative: 15 %
Lymphs Abs: 1.1 10*3/uL (ref 0.7–4.0)
MCH: 27.5 pg (ref 26.0–34.0)
MCHC: 31.8 g/dL (ref 30.0–36.0)
MCV: 86.6 fL (ref 80.0–100.0)
Monocytes Absolute: 0.5 10*3/uL (ref 0.1–1.0)
Monocytes Relative: 7 %
Neutro Abs: 6 10*3/uL (ref 1.7–7.7)
Neutrophils Relative %: 78 %
Platelets: 223 10*3/uL (ref 150–400)
RBC: 3.96 MIL/uL (ref 3.87–5.11)
RDW: 14.8 % (ref 11.5–15.5)
WBC: 7.7 10*3/uL (ref 4.0–10.5)
nRBC: 0 % (ref 0.0–0.2)

## 2020-09-05 LAB — COMPREHENSIVE METABOLIC PANEL
ALT: 33 U/L (ref 0–44)
AST: 39 U/L (ref 15–41)
Albumin: 3.1 g/dL — ABNORMAL LOW (ref 3.5–5.0)
Alkaline Phosphatase: 62 U/L (ref 38–126)
Anion gap: 10 (ref 5–15)
BUN: 33 mg/dL — ABNORMAL HIGH (ref 8–23)
CO2: 23 mmol/L (ref 22–32)
Calcium: 9.2 mg/dL (ref 8.9–10.3)
Chloride: 108 mmol/L (ref 98–111)
Creatinine, Ser: 1.92 mg/dL — ABNORMAL HIGH (ref 0.44–1.00)
GFR, Estimated: 27 mL/min — ABNORMAL LOW (ref >60)
Glucose, Bld: 88 mg/dL (ref 70–99)
Potassium: 3.3 mmol/L — ABNORMAL LOW (ref 3.5–5.1)
Sodium: 141 mmol/L (ref 135–145)
Total Bilirubin: 0.7 mg/dL (ref 0.3–1.2)
Total Protein: 6.3 g/dL — ABNORMAL LOW (ref 6.5–8.1)

## 2020-09-05 LAB — HEMOGLOBIN A1C
Hgb A1c MFr Bld: 5.5 % (ref 4.8–5.6)
Mean Plasma Glucose: 111 mg/dL

## 2020-09-05 LAB — MAGNESIUM: Magnesium: 1.3 mg/dL — ABNORMAL LOW (ref 1.7–2.4)

## 2020-09-05 LAB — PHOSPHORUS: Phosphorus: 2.4 mg/dL — ABNORMAL LOW (ref 2.5–4.6)

## 2020-09-05 LAB — LIPASE, BLOOD: Lipase: 76 U/L — ABNORMAL HIGH (ref 11–51)

## 2020-09-05 MED ORDER — INSULIN ASPART 100 UNIT/ML IJ SOLN
0.0000 [IU] | Freq: Three times a day (TID) | INTRAMUSCULAR | Status: DC
  Administered 2020-09-07: 1 [IU] via SUBCUTANEOUS

## 2020-09-05 MED ORDER — INSULIN ASPART 100 UNIT/ML IJ SOLN: 0.0000 [IU] | Freq: Every day | INTRAMUSCULAR | Status: DC

## 2020-09-05 MED ORDER — POTASSIUM CHLORIDE CRYS ER 20 MEQ PO TBCR
40.0000 meq | EXTENDED_RELEASE_TABLET | Freq: Once | ORAL | Status: AC
  Administered 2020-09-05: 40 meq via ORAL
  Filled 2020-09-05: qty 2

## 2020-09-05 MED ORDER — MAGNESIUM SULFATE 2 GM/50ML IV SOLN
2.0000 g | Freq: Once | INTRAVENOUS | Status: AC
  Administered 2020-09-05: 2 g via INTRAVENOUS
  Filled 2020-09-05: qty 50

## 2020-09-05 NOTE — Progress Notes (Signed)
[facility] Progress Note  [name] 75 y.o. 1946-01-14  CC:  Nausea/vomiting  Subjective: Patient feels well today and has no complaints.  Has not vomited since admission. No current nausea. She just drank about 2/3 of a cup of grape juice.  Denies abdominal pain. No BM since admission.  ROS : Review of Systems  Cardiovascular:  Negative for chest pain and palpitations.  Gastrointestinal:  Negative for abdominal pain, blood in stool, constipation, diarrhea, heartburn, melena, nausea and vomiting.     Objective: Vital signs in last 24 hours: Vitals:   09/04/20 2027 09/05/20 0439  BP: (!) 141/85 (!) 145/59  Pulse: 82 81  Resp: 16 16  Temp: 98.6 F (37 C) 98.1 F (36.7 C)  SpO2: 100% 99%    Physical Exam:  General:  Alert, cooperative, no distress  Head:  Normocephalic, without obvious abnormality, atraumatic  Eyes:  Anicteric sclera, EOMs intact  Lungs:   Clear to auscultation bilaterally, respirations unlabored  Heart:  Regular rate and rhythm, S1, S2 normal  Abdomen:   Soft, non-tender, non-distended, normoactive bowel sounds   Extremities: Extremities normal, atraumatic, no  edema    Lab Results: Recent Labs    09/04/20 0646 09/04/20 1135 09/05/20 0632  NA 140  --  141  K 3.2*  --  3.3*  CL 109  --  108  CO2 22  --  23  GLUCOSE 79  --  88  BUN 45*  --  33*  CREATININE 2.76*  --  1.92*  CALCIUM 9.0  --  9.2  MG  --  1.5* 1.3*  PHOS  --   --  2.4*   Recent Labs    09/04/20 1135 09/05/20 0632  AST 37 39  ALT 32 33  ALKPHOS 58 62  BILITOT 0.6 0.7  PROT 6.6 6.3*  ALBUMIN 3.1* 3.1*   Recent Labs    09/03/20 1919 09/04/20 0646 09/05/20 0632  WBC 9.9 7.9 7.7  NEUTROABS 7.0  --  6.0  HGB 12.2 10.8* 10.9*  HCT 38.1 33.9* 34.3*  MCV 84.7 86.5 86.6  PLT 286 230 223   No results for input(s): LABPROT, INR in the last 72 hours.    Assessment: Nausea and vomiting-possible diabetic gastroparesis -CT 09/03/20 showed no acute  abnormalities in abdomen or pelvis, small hiatal hernia, ileocecal anastomosis without bowel thickening, distention inflammatory change, postcholecystectomy  Unintentional weight loss  History of right hemicolectomy, suspected Crohn's due to positive ASCA   CKD stage IV: BUN 33/ Cr 1.92  History of CHF, pulmonary HTN, CAD  History of left DVT on Eliquis  DM type 2   Plan: Discussed with radiology who recommends outpatient gastric emptying scan if possible, due to limited mobility associated with hospitalization, as well as new medications during admission.  We will try gastroparesis diet. If no improvement, we will need to complete gastric emptying scan while admitted. If she notes improvement, we will defer scan to outpatient.  Gastroparesis diet ordered. Discussed with patient to avoid high fiber or fatty foods. Eat small portions at a time, more frequently. She verbalized understanding.   [facility] will follow.   Salley Slaughter PA-C 09/05/2020, 9:35 AM  Contact #  [phone]

## 2020-09-05 NOTE — Progress Notes (Signed)
PROGRESS NOTE    [name]  FVC:[id number] DOB: 12/20/1945 DOA: 09/03/2020 PCP: Seward Carol, MD   Chief Complaint  Patient presents with   Nausea   Emesis    Brief Narrative:  [name] is [name] 75 y.o. female with medical history significant of DM2, HTN, CKD 4, HFpEF with grade 1 DD, chronic pain, Breast CA in remission.  Prior ? Of Crohn's dz in 2003.  She's had about 1 month of nausea and began throwing up the day prior to admission.  She was started on PPI and budesonide by GI which has not helped.  Admitted for AKI, persistent nausea, vomiting.  See below for additional details  Assessment & Plan:   Principal Problem:   Acute renal failure superimposed on stage 4 chronic kidney disease (HCC) Active Problems:   Chronic pain   Diabetic neuropathy associated with type 2 diabetes mellitus (HCC)   Essential hypertension   Chronic diastolic CHF (congestive heart failure) (HCC)   Nausea and vomiting  AKI on CKD 4 - ? Dehydration from N, poor PO intake, chronic torsemide for dCHF, and now new onset vomiting, as cause. Gentle hydration with IVF Strict intake and outpt Improving renal function Continue to hold torsemide No hydro on CT scan Given improvement near baseline, ok to hold off on renal c/s  N/V, poor PO intake  Mildly Elevated Lipase CT AP again unimpressive Lipase elevated, downtrending, but CT without evidence of pancreatitis on imaging GI c/s, appreciate recs - consider gastric emptying study (on hold per radiology), gastroparesis diet, holding iron  Family notes hx of pancreatic cancer in family - consider additional imaging if workup unrevealing or persistent symptoms, but no clear indication for imaging at this time Clear liquid diet Continue PPI, pepcid for now Ativan for nausea, zofran as well cautiously - repeat EKG for QT eval   Prolonged Qtc Daily EKG Replace lytes telemetry  dCHF - Hold home torsemide Cont home BP meds  (amlodipine, hydralazine, imdur, bystolic)  DM2 - Hold Tradjenta SSI  HTN - Cont home BP meds (amlodipine, hydralazine, imdur, bystolic)  CPS - Daughter says she's no longer taking dilaudid or lyrica   Yeast infection - Starting mycelex Follow urine culture    DVT prophylaxis: eliquis Code Status: full  Family Communication: (none at bedside Disposition:   Status is: Inpatient  Remains inpatient appropriate because:Inpatient level of care appropriate due to severity of illness  Dispo: The patient is from: Home              Anticipated d/c is to: Home              Patient currently is not medically stable to d/c.   Difficult to place patient No       Consultants:  GI  Procedures:  none  Antimicrobials:  Anti-infectives (From admission, onward)    None          Subjective: No new complaints, still not eating much   Objective: Vitals:   09/05/20 0439 09/05/20 1131 09/05/20 1132 09/05/20 1538  BP: (!) 145/59 (!) 174/69 (!) 174/69 (!) 145/60  Pulse: 81   75  Resp: 16   18  Temp: 98.1 F (36.7 C)   98.3 F (36.8 C)  TempSrc: Oral   Oral  SpO2: 99%   97%  Weight:      Height:        Intake/Output Summary (Last 24 hours) at 09/05/2020 1625 Last data filed at 09/05/2020 1400  Gross per 24 hour  Intake 3305.95 ml  Output --  Net 3305.95 ml   Filed Weights   09/03/20 1557 09/03/20 2219  Weight: 68.3 kg 69.7 kg    Examination:  General: No acute distress. Cardiovascular: Heart sounds show Vinh Sachs regular rate, and rhythm.  Lungs: Clear to auscultation bilaterally Abdomen: Soft, mild epigastric ttp Neurological: Alert and oriented 3. Moves all extremities 4 with equal strength. Cranial nerves II through XII grossly intact. Skin: Warm and dry. No rashes or lesions. Extremities: No clubbing or cyanosis. No edema.      Data Reviewed: I have personally reviewed following labs and imaging studies  CBC: Recent Labs  Lab 09/03/20 1919  09/04/20 0646 09/05/20 0632  WBC 9.9 7.9 7.7  NEUTROABS 7.0  --  6.0  HGB 12.2 10.8* 10.9*  HCT 38.1 33.9* 34.3*  MCV 84.7 86.5 86.6  PLT 286 230 448    Basic Metabolic Panel: Recent Labs  Lab 09/03/20 1919 09/04/20 0646 09/04/20 1135 09/05/20 0632  NA 138 140  --  141  K 3.8 3.2*  --  3.3*  CL 103 109  --  108  CO2 21* 22  --  23  GLUCOSE 105* 79  --  88  BUN 46* 45*  --  33*  CREATININE 3.56* 2.76*  --  1.92*  CALCIUM 10.0 9.0  --  9.2  MG  --   --  1.5* 1.3*  PHOS  --   --   --  2.4*    GFR: Estimated Creatinine Clearance: 25 mL/min (Charlie Seda) (by C-G formula based on SCr of 1.92 mg/dL (H)).  Liver Function Tests: Recent Labs  Lab 09/03/20 1919 09/04/20 1135 09/05/20 0632  AST 31 37 39  ALT 28 32 33  ALKPHOS 63 58 62  BILITOT 0.5 0.6 0.7  PROT 7.7 6.6 6.3*  ALBUMIN 3.9 3.1* 3.1*    CBG: Recent Labs  Lab 09/04/20 1952 09/05/20 0015 09/05/20 0423 09/05/20 0737 09/05/20 1153  GLUCAP 100* 93 86 85 119*     Recent Results (from the past 240 hour(s))  Resp Panel by RT-PCR (Flu Ketura Sirek&B, Covid) Nasopharyngeal Swab     Status: None   Collection Time: 09/03/20  8:09 PM   Specimen: Nasopharyngeal Swab; Nasopharyngeal(NP) swabs in vial transport medium  Result Value Ref Range Status   SARS Coronavirus 2 by RT PCR NEGATIVE NEGATIVE Final    Comment: (NOTE) SARS-CoV-2 target nucleic acids are NOT DETECTED.  The SARS-CoV-2 RNA is generally detectable in upper respiratory specimens during the acute phase of infection. The lowest concentration of SARS-CoV-2 viral copies this assay can detect is 138 copies/mL. Mannie Wineland negative result does not preclude SARS-Cov-2 infection and should not be used as the sole basis for treatment or other patient management decisions. Anastassia Noack negative result may occur with  improper specimen collection/handling, submission of specimen other than nasopharyngeal swab, presence of viral mutation(s) within the areas targeted by this assay, and  inadequate number of viral copies(<138 copies/mL). Bethel Sirois negative result must be combined with clinical observations, patient history, and epidemiological information. The expected result is Negative.  Fact Sheet for Patients:  EntrepreneurPulse.com.au  Fact Sheet for Healthcare Providers:  IncredibleEmployment.be  This test is no t yet approved or cleared by the [place] FDA and  has been authorized for detection and/or diagnosis of SARS-CoV-2 by FDA under an Emergency Use Authorization (EUA). This EUA will remain  in effect (meaning this test can be used) for the duration of the  COVID-19 declaration under Section 564(b)(1) of the Act, 21 U.S.C.section 360bbb-3(b)(1), unless the authorization is terminated  or revoked sooner.       Influenza Delsin Copen by PCR NEGATIVE NEGATIVE Final   Influenza B by PCR NEGATIVE NEGATIVE Final    Comment: (NOTE) The Xpert Xpress SARS-CoV-2/FLU/RSV plus assay is intended as an aid in the diagnosis of influenza from Nasopharyngeal swab specimens and should not be used as Braiden Presutti sole basis for treatment. Nasal washings and aspirates are unacceptable for Xpert Xpress SARS-CoV-2/FLU/RSV testing.  Fact Sheet for Patients: EntrepreneurPulse.com.au  Fact Sheet for Healthcare Providers: IncredibleEmployment.be  This test is not yet approved or cleared by the [place] FDA and has been authorized for detection and/or diagnosis of SARS-CoV-2 by FDA under an Emergency Use Authorization (EUA). This EUA will remain in effect (meaning this test can be used) for the duration of the COVID-19 declaration under Section 564(b)(1) of the Act, 21 U.S.C. section 360bbb-3(b)(1), unless the authorization is terminated or revoked.  Performed at [facility], [address] [place], [place] [address]          Radiology Studies: CT ABDOMEN PELVIS WO CONTRAST  Result Date:  09/03/2020 CLINICAL DATA:  Nausea and vomiting. EXAM: CT ABDOMEN AND PELVIS WITHOUT CONTRAST TECHNIQUE: Multidetector CT imaging of the abdomen and pelvis was performed following the standard protocol without IV contrast. COMPARISON:  August 17, 2020 FINDINGS: Lower chest: Small hiatal hernia. Postsurgical changes in the right breast. Hepatobiliary: No focal liver abnormality is seen. Status post cholecystectomy. No biliary dilatation. Pancreas: Unremarkable. No pancreatic ductal dilatation or surrounding inflammatory changes. Spleen: Normal in size without focal abnormality. Adrenals/Urinary Tract: Adrenal glands are unremarkable. Kidneys are normal, without renal calculi, focal lesion, or hydronephrosis. Bladder is unremarkable. Bilateral renal cysts. Stomach/Bowel: Stomach is within normal limits. Sequela of prior right ileocecal anastomosis. No evidence of bowel wall thickening, distention, or inflammatory changes. Vascular/Lymphatic: Aortic atherosclerosis. No enlarged abdominal or pelvic lymph nodes. Reproductive: Status post hysterectomy. No adnexal masses. Other: No abdominal wall hernia or abnormality. No abdominopelvic ascites. Musculoskeletal: No acute or significant osseous findings. IMPRESSION: 1. No evidence of acute abnormalities within the abdomen or pelvis. 2. Small hiatal hernia. 3. Aortic atherosclerosis. Aortic Atherosclerosis (ICD10-I70.0). Electronically Signed   By: Fidela Salisbury M.D.   On: 09/03/2020 19:20        Scheduled Meds:  amLODipine  10 mg Oral Daily   apixaban  5 mg Oral BID   budesonide  3 mg Oral Daily   clotrimazole  10 mg Oral 5 X Daily   famotidine  10 mg Oral Daily   hydrALAZINE  100 mg Oral TID   insulin aspart  0-9 Units Subcutaneous Q4H   isosorbide mononitrate  15 mg Oral Daily   melatonin  10 mg Oral QHS   nebivolol  10 mg Oral Daily   pantoprazole  40 mg Oral Daily   rosuvastatin  5 mg Oral Daily   Continuous Infusions:  lactated ringers 100 mL/hr  at 09/05/20 0432     LOS: 2 days    Time spent: over 30 min    Fayrene Helper, MD [facility]   To contact the attending provider between 7A-7P or the covering provider during after hours 7P-7A, please log into the web site [email or web address] and access using universal [place] password for that web site. If you do not have the password, please call the hospital operator.  09/05/2020, 4:25 PM

## 2020-09-06 ENCOUNTER — Other Ambulatory Visit: Payer: Self-pay

## 2020-09-06 ENCOUNTER — Encounter: Payer: Medicare Other | Admitting: Physical Therapy

## 2020-09-06 LAB — CBC WITH DIFFERENTIAL/PLATELET
Abs Immature Granulocytes: 0.04 10*3/uL (ref 0.00–0.07)
Basophils Absolute: 0 10*3/uL (ref 0.0–0.1)
Basophils Relative: 0 %
Eosinophils Absolute: 0 10*3/uL (ref 0.0–0.5)
Eosinophils Relative: 0 %
HCT: 32.8 % — ABNORMAL LOW (ref 36.0–46.0)
Hemoglobin: 10.5 g/dL — ABNORMAL LOW (ref 12.0–15.0)
Immature Granulocytes: 1 %
Lymphocytes Relative: 15 %
Lymphs Abs: 1.1 10*3/uL (ref 0.7–4.0)
MCH: 27.7 pg (ref 26.0–34.0)
MCHC: 32 g/dL (ref 30.0–36.0)
MCV: 86.5 fL (ref 80.0–100.0)
Monocytes Absolute: 0.5 10*3/uL (ref 0.1–1.0)
Monocytes Relative: 8 %
Neutro Abs: 5.3 10*3/uL (ref 1.7–7.7)
Neutrophils Relative %: 76 %
Platelets: 224 10*3/uL (ref 150–400)
RBC: 3.79 MIL/uL — ABNORMAL LOW (ref 3.87–5.11)
RDW: 15 % (ref 11.5–15.5)
WBC: 7 10*3/uL (ref 4.0–10.5)
nRBC: 0 % (ref 0.0–0.2)

## 2020-09-06 LAB — COMPREHENSIVE METABOLIC PANEL
ALT: 30 U/L (ref 0–44)
AST: 33 U/L (ref 15–41)
Albumin: 3 g/dL — ABNORMAL LOW (ref 3.5–5.0)
Alkaline Phosphatase: 57 U/L (ref 38–126)
Anion gap: 8 (ref 5–15)
BUN: 28 mg/dL — ABNORMAL HIGH (ref 8–23)
CO2: 23 mmol/L (ref 22–32)
Calcium: 9.3 mg/dL (ref 8.9–10.3)
Chloride: 111 mmol/L (ref 98–111)
Creatinine, Ser: 1.67 mg/dL — ABNORMAL HIGH (ref 0.44–1.00)
GFR, Estimated: 32 mL/min — ABNORMAL LOW (ref >60)
Glucose, Bld: 102 mg/dL — ABNORMAL HIGH (ref 70–99)
Potassium: 3.6 mmol/L (ref 3.5–5.1)
Sodium: 142 mmol/L (ref 135–145)
Total Bilirubin: 0.4 mg/dL (ref 0.3–1.2)
Total Protein: 6.4 g/dL — ABNORMAL LOW (ref 6.5–8.1)

## 2020-09-06 LAB — PHOSPHORUS: Phosphorus: 2.3 mg/dL — ABNORMAL LOW (ref 2.5–4.6)

## 2020-09-06 LAB — GLUCOSE, CAPILLARY
Glucose-Capillary: 100 mg/dL — ABNORMAL HIGH (ref 70–99)
Glucose-Capillary: 105 mg/dL — ABNORMAL HIGH (ref 70–99)
Glucose-Capillary: 114 mg/dL — ABNORMAL HIGH (ref 70–99)
Glucose-Capillary: 122 mg/dL — ABNORMAL HIGH (ref 70–99)
Glucose-Capillary: 96 mg/dL (ref 70–99)
Glucose-Capillary: 98 mg/dL (ref 70–99)

## 2020-09-06 LAB — MAGNESIUM: Magnesium: 1.8 mg/dL (ref 1.7–2.4)

## 2020-09-06 MED ORDER — MAGNESIUM SULFATE IN D5W 1-5 GM/100ML-% IV SOLN
1.0000 g | Freq: Once | INTRAVENOUS | Status: AC
  Administered 2020-09-06: 1 g via INTRAVENOUS
  Filled 2020-09-06: qty 100

## 2020-09-06 NOTE — Progress Notes (Signed)
PROGRESS NOTE    [name]  MHD:[id number] DOB: 1945/04/17 DOA: 09/03/2020 PCP: Seward Carol, MD   Chief Complaint  Patient presents with   Nausea   Emesis    Brief Narrative:  [name] is [name] 75 y.o. female with medical history significant of DM2, HTN, CKD 4, HFpEF with grade 1 DD, chronic pain, Breast CA in remission.  Prior ? Of Crohn's dz in 2003.  She's had about 1 month of nausea and began throwing up the day prior to admission.  She was started on PPI and budesonide by GI which has not helped.  Admitted for AKI, persistent nausea, vomiting.  See below for additional details  Assessment & Plan:   Principal Problem:   Acute renal failure superimposed on stage 4 chronic kidney disease (HCC) Active Problems:   Chronic pain   Diabetic neuropathy associated with type 2 diabetes mellitus (HCC)   Essential hypertension   Chronic diastolic CHF (congestive heart failure) (HCC)   Nausea and vomiting  N/V, poor PO intake  Mildly Elevated Lipase CT AP again unimpressive Lipase elevated, downtrending, but CT without evidence of pancreatitis on imaging GI c/s, appreciate recs - consider gastric emptying study (on hold per radiology), gastroparesis diet, holding iron -- if not able to tolerate PO intake, planning for gastric emptying study Family notes hx of pancreatic cancer in family - consider additional imaging if workup unrevealing or persistent symptoms, but no clear indication for imaging at this time Regular diet, gastroparesis diet  Continue PPI, pepcid for now Ativan for nausea, zofran as well cautiously - daily EKG for QT eval   AKI on CKD 4 - ? Dehydration from N, poor PO intake, chronic torsemide for dCHF, and now new onset vomiting, as cause. Hold IVF for now Strict intake and outpt Improving renal function Continue to hold torsemide No hydro on CT scan Given improvement near baseline, ok to hold off on renal c/s  Prolonged Qtc Daily  EKG Replace lytes telemetry  dCHF - Hold home torsemide Cont home BP meds (amlodipine, hydralazine, imdur, bystolic)  DM2 - Hold Tradjenta SSI  HTN - Cont home BP meds (amlodipine, hydralazine, imdur, bystolic)  CPS - Daughter says she's no longer taking dilaudid or lyrica   Yeast infection - Starting mycelex Follow urine culture    DVT prophylaxis: eliquis Code Status: full  Family Communication: (none at bedside Disposition:   Status is: Inpatient  Remains inpatient appropriate because:Inpatient level of care appropriate due to severity of illness  Dispo: The patient is from: Home              Anticipated d/c is to: Home              Patient currently is not medically stable to d/c.   Difficult to place patient No       Consultants:  GI  Procedures:  none  Antimicrobials:  Anti-infectives (From admission, onward)    None          Subjective: Still not eating significant amounts  Objective: Vitals:   09/05/20 2009 09/06/20 0421 09/06/20 0930 09/06/20 1432  BP: (!) 135/52 140/62 (!) 147/64 125/69  Pulse: 75 77  79  Resp: 14 14  18   Temp: 99.1 F (37.3 C) 98.7 F (37.1 C)  97.9 F (36.6 C)  TempSrc: Oral Oral  Oral  SpO2: 99% 99%  95%  Weight:      Height:        Intake/Output Summary (  Last 24 hours) at 09/06/2020 1548 Last data filed at 09/05/2020 1902 Gross per 24 hour  Intake 120 ml  Output --  Net 120 ml   Filed Weights   09/03/20 1557 09/03/20 2219  Weight: 68.3 kg 69.7 kg    Examination:  General: No acute distress. Cardiovascular: Heart sounds show Branon Sabine regular rate, and rhythm.  Lungs: Clear to auscultation bilaterally  Abdomen: Soft, nontender, nondistended Neurological: Alert and oriented 3. Moves all extremities 4. Cranial nerves II through XII grossly intact. Skin: Warm and dry. No rashes or lesions. Extremities: No clubbing or cyanosis. No edema.   Data Reviewed: I have personally reviewed following labs and  imaging studies  CBC: Recent Labs  Lab 09/03/20 1919 09/04/20 0646 09/05/20 0632 09/06/20 0553  WBC 9.9 7.9 7.7 7.0  NEUTROABS 7.0  --  6.0 5.3  HGB 12.2 10.8* 10.9* 10.5*  HCT 38.1 33.9* 34.3* 32.8*  MCV 84.7 86.5 86.6 86.5  PLT 286 230 223 062    Basic Metabolic Panel: Recent Labs  Lab 09/03/20 1919 09/04/20 0646 09/04/20 1135 09/05/20 0632 09/06/20 0553  NA 138 140  --  141 142  K 3.8 3.2*  --  3.3* 3.6  CL 103 109  --  108 111  CO2 21* 22  --  23 23  GLUCOSE 105* 79  --  88 102*  BUN 46* 45*  --  33* 28*  CREATININE 3.56* 2.76*  --  1.92* 1.67*  CALCIUM 10.0 9.0  --  9.2 9.3  MG  --   --  1.5* 1.3* 1.8  PHOS  --   --   --  2.4* 2.3*    GFR: Estimated Creatinine Clearance: 28.7 mL/min (Bonifacio Pruden) (by C-G formula based on SCr of 1.67 mg/dL (H)).  Liver Function Tests: Recent Labs  Lab 09/03/20 1919 09/04/20 1135 09/05/20 0632 09/06/20 0553  AST 31 37 39 33  ALT 28 32 33 30  ALKPHOS 63 58 62 57  BILITOT 0.5 0.6 0.7 0.4  PROT 7.7 6.6 6.3* 6.4*  ALBUMIN 3.9 3.1* 3.1* 3.0*    CBG: Recent Labs  Lab 09/05/20 2011 09/05/20 2358 09/06/20 0423 09/06/20 0716 09/06/20 1144  GLUCAP 108* 105* 98 100* 96     Recent Results (from the past 240 hour(s))  Resp Panel by RT-PCR (Flu Thelonious Kauffmann&B, Covid) Nasopharyngeal Swab     Status: None   Collection Time: 09/03/20  8:09 PM   Specimen: Nasopharyngeal Swab; Nasopharyngeal(NP) swabs in vial transport medium  Result Value Ref Range Status   SARS Coronavirus 2 by RT PCR NEGATIVE NEGATIVE Final    Comment: (NOTE) SARS-CoV-2 target nucleic acids are NOT DETECTED.  The SARS-CoV-2 RNA is generally detectable in upper respiratory specimens during the acute phase of infection. The lowest concentration of SARS-CoV-2 viral copies this assay can detect is 138 copies/mL. Monia Timmers negative result does not preclude SARS-Cov-2 infection and should not be used as the sole basis for treatment or other patient management decisions. Burley Kopka negative  result may occur with  improper specimen collection/handling, submission of specimen other than nasopharyngeal swab, presence of viral mutation(s) within the areas targeted by this assay, and inadequate number of viral copies(<138 copies/mL). Tiara Maultsby negative result must be combined with clinical observations, patient history, and epidemiological information. The expected result is Negative.  Fact Sheet for Patients:  EntrepreneurPulse.com.au  Fact Sheet for Healthcare Providers:  IncredibleEmployment.be  This test is no t yet approved or cleared by the [place] FDA and  has  been authorized for detection and/or diagnosis of SARS-CoV-2 by FDA under an Emergency Use Authorization (EUA). This EUA will remain  in effect (meaning this test can be used) for the duration of the COVID-19 declaration under Section 564(b)(1) of the Act, 21 U.S.C.section 360bbb-3(b)(1), unless the authorization is terminated  or revoked sooner.       Influenza Yanci Bachtell by PCR NEGATIVE NEGATIVE Final   Influenza B by PCR NEGATIVE NEGATIVE Final    Comment: (NOTE) The Xpert Xpress SARS-CoV-2/FLU/RSV plus assay is intended as an aid in the diagnosis of influenza from Nasopharyngeal swab specimens and should not be used as Marlan Steward sole basis for treatment. Nasal washings and aspirates are unacceptable for Xpert Xpress SARS-CoV-2/FLU/RSV testing.  Fact Sheet for Patients: EntrepreneurPulse.com.au  Fact Sheet for Healthcare Providers: IncredibleEmployment.be  This test is not yet approved or cleared by the [place] FDA and has been authorized for detection and/or diagnosis of SARS-CoV-2 by FDA under an Emergency Use Authorization (EUA). This EUA will remain in effect (meaning this test can be used) for the duration of the COVID-19 declaration under Section 564(b)(1) of the Act, 21 U.S.C. section 360bbb-3(b)(1), unless the authorization is  terminated or revoked.  Performed at [facility], [address] [place], [place] [address]          Radiology Studies: No results found.      Scheduled Meds:  amLODipine  10 mg Oral Daily   apixaban  5 mg Oral BID   budesonide  3 mg Oral Daily   clotrimazole  10 mg Oral 5 X Daily   famotidine  10 mg Oral Daily   hydrALAZINE  100 mg Oral TID   insulin aspart  0-5 Units Subcutaneous QHS   insulin aspart  0-9 Units Subcutaneous TID WC   isosorbide mononitrate  15 mg Oral Daily   melatonin  10 mg Oral QHS   nebivolol  10 mg Oral Daily   pantoprazole  40 mg Oral Daily   rosuvastatin  5 mg Oral Daily   Continuous Infusions:  lactated ringers 100 mL/hr at 09/06/20 1501     LOS: 3 days    Time spent: over 30 min    Fayrene Helper, MD [facility]   To contact the attending provider between 7A-7P or the covering provider during after hours 7P-7A, please log into the web site [email or web address] and access using universal [place] password for that web site. If you do not have the password, please call the hospital operator.  09/06/2020, 3:48 PM

## 2020-09-06 NOTE — Progress Notes (Signed)
[facility] Progress Note  [name] 75 y.o. Dec 11, 1945  CC:  Nausea/vomiting  Subjective: Patient feels nauseated, she states she has only had some water and a spoonful of applesauce today.  Denies vomiting. Denies abdominal pain. No BM since admission.  ROS : Review of Systems  Cardiovascular:  Negative for chest pain and palpitations.  Gastrointestinal:  Positive for constipation. Negative for abdominal pain, blood in stool, diarrhea, heartburn, melena, nausea and vomiting.     Objective: Vital signs in last 24 hours: Vitals:   09/06/20 0421 09/06/20 0930  BP: 140/62 (!) 147/64  Pulse: 77   Resp: 14   Temp: 98.7 F (37.1 C)   SpO2: 99%     Physical Exam:  General:  Alert, cooperative, no distress  Head:  Normocephalic, without obvious abnormality, atraumatic  Eyes:  Anicteric sclera, EOMs intact  Lungs:   Breathing comfortably on room air  Abdomen:   Soft, non-tender, non-distended, normoactive bowel sounds   Extremities: Extremities normal, atraumatic, no  edema    Lab Results: Recent Labs    09/05/20 0632 09/06/20 0553  NA 141 142  K 3.3* 3.6  CL 108 111  CO2 23 23  GLUCOSE 88 102*  BUN 33* 28*  CREATININE 1.92* 1.67*  CALCIUM 9.2 9.3  MG 1.3* 1.8  PHOS 2.4* 2.3*    Recent Labs    09/05/20 0632 09/06/20 0553  AST 39 33  ALT 33 30  ALKPHOS 62 57  BILITOT 0.7 0.4  PROT 6.3* 6.4*  ALBUMIN 3.1* 3.0*    Recent Labs    09/05/20 0632 09/06/20 0553  WBC 7.7 7.0  NEUTROABS 6.0 5.3  HGB 10.9* 10.5*  HCT 34.3* 32.8*  MCV 86.6 86.5  PLT 223 224    No results for input(s): LABPROT, INR in the last 72 hours.    Assessment: Nausea and vomiting-possible diabetic gastroparesis -CT 09/03/20 showed no acute abnormalities in abdomen or pelvis, small hiatal hernia, ileocecal anastomosis without bowel thickening, distention inflammatory change, postcholecystectomy  Unintentional weight loss  History of right hemicolectomy,  suspected Crohn's due to positive ASCA   CKD stage IV: BUN 28/ Cr 1.67  History of CHF, pulmonary HTN, CAD  History of left DVT on Eliquis  DM type 2   Plan: Continue gastroparesis diet. Small frequent meals, avoid high fiber or fatty foods.   If patient unable to tolerate PO intake, recommend proceeding with inpatient gastric emptying scan.   [facility] will follow.   Salley Slaughter PA-C 09/06/2020, 12:48 PM  Contact #  [phone]

## 2020-09-06 NOTE — Progress Notes (Signed)
Patient ate minimal portion of her dinner. "A few bites of spaghetti noodles" according to patient. She admits to being a bit nauseous but no vomiting at this time.

## 2020-09-07 DIAGNOSIS — N189 Chronic kidney disease, unspecified: Secondary | ICD-10-CM

## 2020-09-07 LAB — GLUCOSE, CAPILLARY
Glucose-Capillary: 117 mg/dL — ABNORMAL HIGH (ref 70–99)
Glucose-Capillary: 133 mg/dL — ABNORMAL HIGH (ref 70–99)
Glucose-Capillary: 108 mg/dL — ABNORMAL HIGH (ref 70–99)
Glucose-Capillary: 94 mg/dL (ref 70–99)

## 2020-09-07 LAB — C DIFFICILE (CDIFF) QUICK SCRN (NO PCR REFLEX)
C Diff antigen: NEGATIVE
C Diff interpretation: NOT DETECTED
C Diff toxin: NEGATIVE

## 2020-09-07 LAB — URINE CULTURE: Culture: >=100000 — AB

## 2020-09-07 MED ORDER — POTASSIUM CHLORIDE CRYS ER 20 MEQ PO TBCR
20.0000 meq | EXTENDED_RELEASE_TABLET | Freq: Once | ORAL | Status: AC
  Administered 2020-09-07: 20 meq via ORAL
  Filled 2020-09-07: qty 1

## 2020-09-07 NOTE — Progress Notes (Addendum)
[facility]  PROGRESS NOTE  Hatley [name] Fischl YQM:[id number] DOB: 1945/09/07 DOA: 09/03/2020 PCP: Seward Carol, MD   Brief HPI:   75 year old female with medical history of diabetes mellitus type 2, hypertension, CKD stage IV, HFpEF with grade 1 diastolic dysfunction, chronic pain syndrome, breast cancer in remission, prior history of Crohn's disease in 2003 came to hospital with 1 month history of nausea and began vomiting 1 day prior to admission.  She was started on PPI and budesonide by GI which did not help her.  Patient was mated for acute kidney injury, persistent nausea and vomiting.    Subjective   Patient seen and examined, complains of diarrhea since 1 AM last night.  Had 5 episodes of loose bowel movements.   Assessment/Plan:    Nausea/vomiting -Patient has history of poor p.o. intake, had mild elevation of lipase; lipase came back to normal -Gastroenterology was consulted, plan was to obtain gastric emptying study,  however radiologist did not recommend his inpatient.  Likely will need as outpatient. -GI following, plan for EGD in a.m. -Continue Protonix 40 mg daily   Diarrhea -Stool for C. difficile PCR ordered by GI -We will follow result   Acute kidney injury on CKD stage IV -Likely from poor p.o. intake and torsemide for diastolic CHF -Renal function is slowly improving, today creatinine is 1.67; at baseline -No hydronephrosis seen on CT scan abdomen   Prolonged QTC -EKG obtained yesterday showed QTC of 508 -Continue monitoring on telemetry -Magnesium was 1.8 yesterday; she received  magnesium of 1 g mag sulfate IV x1 -Follow serum magnesium level in a.m. -Potassium was 3.6 yesterday, will give additional K- Dur 20 mEq p.o. x1 today   Diastolic CHF -Continue to hold torsemide  Diabetes mellitus type 2 -Hold Tradjenta -Continue sliding scale insulin with NovoLog -CBG well controlled  UTI -Urine culture growing yeast -Patient started on  Mycelex  History of left peroneal vein DVT -Patient is currently on apixaban, started in April 2022 for 3 months -Apixaban on hold for EGD in a.m.  Hypertension -Continue Bystolic, isosorbide, amlodipine  History of right hemicolectomy/?  Crohn's disease -Continue budesonide   Scheduled medications:    amLODipine  10 mg Oral Daily   budesonide  3 mg Oral Daily   clotrimazole  10 mg Oral 5 X Daily   famotidine  10 mg Oral Daily   hydrALAZINE  100 mg Oral TID   insulin aspart  0-5 Units Subcutaneous QHS   insulin aspart  0-9 Units Subcutaneous TID WC   isosorbide mononitrate  15 mg Oral Daily   melatonin  10 mg Oral QHS   nebivolol  10 mg Oral Daily   pantoprazole  40 mg Oral Daily   rosuvastatin  5 mg Oral Daily         Data Reviewed:   CBG:  Recent Labs  Lab 09/06/20 1144 09/06/20 1720 09/06/20 2108 09/07/20 0746 09/07/20 1404  GLUCAP 96 114* 122* 117* 133*    SpO2: 100 %    Vitals:   09/06/20 2115 09/07/20 0532 09/07/20 0949 09/07/20 1458  BP: (!) 149/66 (!) 146/54 (!) 167/78 (!) 145/63  Pulse: 82 80  81  Resp: 18 16  16   Temp: 98.4 F (36.9 C) 98.4 F (36.9 C)  98.2 F (36.8 C)  TempSrc: Oral Oral  Oral  SpO2: 98% 98%  100%  Weight:      Height:         Intake/Output Summary (Last 24 hours) at  09/07/2020 1653 Last data filed at 09/07/2020 1405 Gross per 24 hour  Intake 0 ml  Output --  Net 0 ml    06/20 1901 - 06/22 0700 In: 120 [P.O.:120] Out: -   Filed Weights   09/03/20 1557 09/03/20 2219  Weight: 68.3 kg 69.7 kg    CBC:  Recent Labs  Lab 09/03/20 1919 09/04/20 0646 09/05/20 0632 09/06/20 0553  WBC 9.9 7.9 7.7 7.0  HGB 12.2 10.8* 10.9* 10.5*  HCT 38.1 33.9* 34.3* 32.8*  PLT 286 230 223 224  MCV 84.7 86.5 86.6 86.5  MCH 27.1 27.6 27.5 27.7  MCHC 32.0 31.9 31.8 32.0  RDW 15.2 15.0 14.8 15.0  LYMPHSABS 2.1  --  1.1 1.1  MONOABS 0.7  --  0.5 0.5  EOSABS 0.0  --  0.0 0.0  BASOSABS 0.0  --  0.0 0.0    Complete  metabolic panel:  Recent Labs  Lab 09/03/20 1919 09/04/20 0646 09/04/20 1135 09/05/20 0632 09/06/20 0553  NA 138 140  --  141 142  K 3.8 3.2*  --  3.3* 3.6  CL 103 109  --  108 111  CO2 21* 22  --  23 23  GLUCOSE 105* 79  --  88 102*  BUN 46* 45*  --  33* 28*  CREATININE 3.56* 2.76*  --  1.92* 1.67*  CALCIUM 10.0 9.0  --  9.2 9.3  AST 31  --  37 39 33  ALT 28  --  32 33 30  ALKPHOS 63  --  58 62 57  BILITOT 0.5  --  0.6 0.7 0.4  ALBUMIN 3.9  --  3.1* 3.1* 3.0*  MG  --   --  1.5* 1.3* 1.8  HGBA1C  --  5.5  --   --   --     Recent Labs  Lab 09/03/20 1919 09/04/20 1135 09/05/20 0632  LIPASE 186* 105* 76*    Recent Labs  Lab 09/03/20 2009  SARSCOV2NAA NEGATIVE    ------------------------------------------------------------------------------------------------------------------ No results for input(s): CHOL, HDL, LDLCALC, TRIG, CHOLHDL, LDLDIRECT in the last 72 hours.  Lab Results  Component Value Date   HGBA1C 5.5 09/04/2020   ------------------------------------------------------------------------------------------------------------------ No results for input(s): TSH, T4TOTAL, T3FREE, THYROIDAB in the last 72 hours.  Invalid input(s): FREET3 ------------------------------------------------------------------------------------------------------------------ No results for input(s): VITAMINB12, FOLATE, FERRITIN, TIBC, IRON, RETICCTPCT in the last 72 hours.  Coagulation profile No results for input(s): INR, PROTIME in the last 168 hours. No results for input(s): DDIMER in the last 72 hours.  Cardiac Enzymes No results for input(s): CKTOTAL, CKMB, CKMBINDEX, TROPONINI in the last 168 hours.  ------------------------------------------------------------------------------------------------------------------    Component Value Date/Time   BNP 195.0 (H) 07/20/2020 1530     Antibiotics: Anti-infectives (From admission, onward)    None        Radiology  Reports  No results found.    DVT prophylaxis: Apixaban  Code Status: Full code  Family Communication: No family at bedside   Consultants: Gastroenterology  Procedures:     Objective    Physical Examination:  General-appears in no acute distress Heart-S1-S2, regular, no murmur auscultated Lungs-clear to auscultation bilaterally, no wheezing or crackles auscultated Abdomen-soft, nontender, no organomegaly Extremities-no edema in the lower extremities Neuro-alert, oriented x3, no focal deficit noted  Status is: Inpatient  Dispo: The patient is from: Home              Anticipated d/c is to: Home  Anticipated d/c date is: 09/09/2020              Patient currently not stable for discharge  Barrier to discharge-ongoing evaluation for abdominal pain, nausea /vomiting, diarrhea  COVID-19 Labs  No results for input(s): DDIMER, FERRITIN, LDH, CRP in the last 72 hours.  Lab Results  Component Value Date   [place] NEGATIVE 09/03/2020   [place] NEGATIVE 08/17/2020   [place] NEGATIVE 07/20/2020   [place] NEGATIVE 06/28/2020    Microbiology  Recent Results (from the past 240 hour(s))  Urine culture     Status: Abnormal   Collection Time: 09/03/20  8:06 PM   Specimen: Urine, Clean Catch  Result Value Ref Range Status   Specimen Description   Final    URINE, CLEAN CATCH Performed at [facility], [address] [place], [place] [address]    Special Requests   Final    NONE Performed at [facility], [address], [place], [place] [address]    Culture >=100,000 COLONIES/mL YEAST (A)  Final   Report Status 09/07/2020 FINAL  Final  Resp Panel by RT-PCR (Flu A&B, Covid) Nasopharyngeal Swab     Status: None   Collection Time: 09/03/20  8:09 PM   Specimen: Nasopharyngeal Swab; Nasopharyngeal(NP) swabs in vial transport medium  Result Value Ref Range Status   SARS Coronavirus 2 by RT PCR NEGATIVE  NEGATIVE Final    Comment: (NOTE) SARS-CoV-2 target nucleic acids are NOT DETECTED.  The SARS-CoV-2 RNA is generally detectable in upper respiratory specimens during the acute phase of infection. The lowest concentration of SARS-CoV-2 viral copies this assay can detect is 138 copies/mL. A negative result does not preclude SARS-Cov-2 infection and should not be used as the sole basis for treatment or other patient management decisions. A negative result may occur with  improper specimen collection/handling, submission of specimen other than nasopharyngeal swab, presence of viral mutation(s) within the areas targeted by this assay, and inadequate number of viral copies(<138 copies/mL). A negative result must be combined with clinical observations, patient history, and epidemiological information. The expected result is Negative.  Fact Sheet for Patients:  EntrepreneurPulse.com.au  Fact Sheet for Healthcare Providers:  IncredibleEmployment.be  This test is no t yet approved or cleared by the [place] FDA and  has been authorized for detection and/or diagnosis of SARS-CoV-2 by FDA under an Emergency Use Authorization (EUA). This EUA will remain  in effect (meaning this test can be used) for the duration of the COVID-19 declaration under Section 564(b)(1) of the Act, 21 U.S.C.section 360bbb-3(b)(1), unless the authorization is terminated  or revoked sooner.       Influenza A by PCR NEGATIVE NEGATIVE Final   Influenza B by PCR NEGATIVE NEGATIVE Final    Comment: (NOTE) The Xpert Xpress SARS-CoV-2/FLU/RSV plus assay is intended as an aid in the diagnosis of influenza from Nasopharyngeal swab specimens and should not be used as a sole basis for treatment. Nasal washings and aspirates are unacceptable for Xpert Xpress SARS-CoV-2/FLU/RSV testing.  Fact Sheet for Patients: EntrepreneurPulse.com.au  Fact Sheet for Healthcare  Providers: IncredibleEmployment.be  This test is not yet approved or cleared by the [place] FDA and has been authorized for detection and/or diagnosis of SARS-CoV-2 by FDA under an Emergency Use Authorization (EUA). This EUA will remain in effect (meaning this test can be used) for the duration of the COVID-19 declaration under Section 564(b)(1) of the Act, 21 U.S.C. section 360bbb-3(b)(1), unless the authorization is terminated or revoked.  Performed at [facility], [place], [place] [address]   C Difficile Quick Screen (NO PCR Reflex)     Status: None   Collection Time: 09/07/20  3:46 PM   Specimen: STOOL  Result Value Ref Range Status   C Diff antigen NEGATIVE NEGATIVE Final   C Diff toxin NEGATIVE NEGATIVE Final   C Diff interpretation No C. difficile detected.  Final    Comment: Performed at [facility], [place] [address] [place], [place] [address]             Oswald Hillock   [facility] If 7PM-7AM, please contact night-coverage at [email or web address], Office  [phone]   09/07/2020, 4:53 PM  LOS: 4 days

## 2020-09-07 NOTE — Progress Notes (Signed)
[facility]astroenterology Progress Note  [name] 75 y.o. Dec 26, 1945  CC:  Nausea/vomiting  Subjective: Patient denies nausea or vomiting today, though had 5 episodes of diarrhea since 1am.  Reports some abdominal cramping with diarrhea.  Prior to this, she had not had a BM since admission.   ROS : Review of Systems  Cardiovascular:  Negative for chest pain and palpitations.  Gastrointestinal:  Positive for diarrhea. Negative for abdominal pain, blood in stool, constipation, heartburn, melena, nausea and vomiting.    Objective: Vital signs in last 24 hours: Vitals:   09/06/20 2115 09/07/20 0532  BP: (!) 149/66 (!) 146/54  Pulse: 82 80  Resp: 18 16  Temp: 98.4 F (36.9 C) 98.4 F (36.9 C)  SpO2: 98% 98%    Physical Exam:  General:  Lying in bed comfortably, oriented, cooperative, no distress  Head:  Normocephalic, without obvious abnormality, atraumatic  Eyes:  Anicteric sclera, EOMs intact  Lungs:   Breathing comfortably on room air  Abdomen:   Soft, non-tender, non-distended, normoactive bowel sounds   Extremities: Extremities normal, atraumatic, no  edema    Lab Results: Recent Labs    09/05/20 0632 09/06/20 0553  NA 141 142  K 3.3* 3.6  CL 108 111  CO2 23 23  GLUCOSE 88 102*  BUN 33* 28*  CREATININE 1.92* 1.67*  CALCIUM 9.2 9.3  MG 1.3* 1.8  PHOS 2.4* 2.3*    Recent Labs    09/05/20 0632 09/06/20 0553  AST 39 33  ALT 33 30  ALKPHOS 62 57  BILITOT 0.7 0.4  PROT 6.3* 6.4*  ALBUMIN 3.1* 3.0*    Recent Labs    09/05/20 0632 09/06/20 0553  WBC 7.7 7.0  NEUTROABS 6.0 5.3  HGB 10.9* 10.5*  HCT 34.3* 32.8*  MCV 86.6 86.5  PLT 223 224    No results for input(s): LABPROT, INR in the last 72 hours.    Assessment: Nausea and vomiting-possible diabetic gastroparesis.  -CT 09/03/20 showed no acute abnormalities in abdomen or pelvis, small hiatal hernia, ileocecal anastomosis without bowel thickening, distention inflammatory change,  postcholecystectomy -On clotrimazole troches empirically for candida  Diarrhea, previously had not had a BM since admission 6/18  Unintentional weight loss  History of right hemicolectomy, suspected Crohn's due to positive ASCA   CKD stage IV: BUN 28/ Cr 1.67 as of 6/21  History of CHF, pulmonary HTN, CAD  History of left DVT on Eliquis  DM type 2   Plan: EGD tomorrow 6/23 (1PM) for further evaluation of weight loss and nausea/vomiting. I thoroughly discussed the procedure with the patient to include nature, alternatives, benefits, and risks (including but not limited to bleeding, infection, perforation, anesthesia/cardiac and pulmonary complications). Patient verbalized understanding and gave verbal consent to proceed with EGD.  Continue gastroparesis diet. Small frequent meals, avoid high fiber or fatty foods.   NPO at midnight.  Hold Eliquis in anticipation of EGD (last dose 6/21 PM).  Consider stool studies if persistent diarrhea.  [facility] will follow.   Salley Slaughter PA-C 09/07/2020, 9:50 AM  Contact #  [phone]

## 2020-09-08 ENCOUNTER — Inpatient Hospital Stay (HOSPITAL_COMMUNITY): Payer: Medicare Other | Admitting: Registered Nurse

## 2020-09-08 ENCOUNTER — Encounter (HOSPITAL_COMMUNITY): Payer: Self-pay | Admitting: Family Medicine

## 2020-09-08 ENCOUNTER — Encounter (HOSPITAL_COMMUNITY)
Admission: EM | Disposition: A | Discharge status: Home / Self Care | Payer: Self-pay | Source: Home / Self Care | Attending: Family Medicine

## 2020-09-08 ENCOUNTER — Other Ambulatory Visit: Payer: Self-pay

## 2020-09-08 ENCOUNTER — Encounter: Payer: Medicare Other | Admitting: Physical Therapy

## 2020-09-08 ENCOUNTER — Other Ambulatory Visit: Payer: Self-pay | Admitting: Nurse Practitioner

## 2020-09-08 DIAGNOSIS — D464 Refractory anemia, unspecified: Secondary | ICD-10-CM

## 2020-09-08 HISTORY — PX: BIOPSY: SHX5522

## 2020-09-08 HISTORY — PX: ESOPHAGOGASTRODUODENOSCOPY: SHX5428

## 2020-09-08 LAB — GLUCOSE, CAPILLARY
Glucose-Capillary: 97 mg/dL (ref 70–99)
Glucose-Capillary: 90 mg/dL (ref 70–99)
Glucose-Capillary: 78 mg/dL (ref 70–99)
Glucose-Capillary: 73 mg/dL (ref 70–99)

## 2020-09-08 LAB — BASIC METABOLIC PANEL
Anion gap: 9 (ref 5–15)
BUN: 20 mg/dL (ref 8–23)
CO2: 23 mmol/L (ref 22–32)
Calcium: 9.4 mg/dL (ref 8.9–10.3)
Chloride: 111 mmol/L (ref 98–111)
Creatinine, Ser: 1.97 mg/dL — ABNORMAL HIGH (ref 0.44–1.00)
GFR, Estimated: 26 mL/min — ABNORMAL LOW (ref >60)
Glucose, Bld: 96 mg/dL (ref 70–99)
Potassium: 3.5 mmol/L (ref 3.5–5.1)
Sodium: 143 mmol/L (ref 135–145)

## 2020-09-08 LAB — MAGNESIUM: Magnesium: 1.9 mg/dL (ref 1.7–2.4)

## 2020-09-08 SURGERY — EGD (ESOPHAGOGASTRODUODENOSCOPY)
Anesthesia: Monitor Anesthesia Care

## 2020-09-08 MED ORDER — SODIUM CHLORIDE 0.9 % IV SOLN: INTRAVENOUS | Status: DC

## 2020-09-08 MED ORDER — PROPOFOL 500 MG/50ML IV EMUL
INTRAVENOUS | Status: DC | PRN
  Administered 2020-09-08: 120 ug/kg/min via INTRAVENOUS

## 2020-09-08 MED ORDER — SUCRALFATE 1 GM/10ML PO SUSP
1.0000 g | Freq: Three times a day (TID) | ORAL | Status: DC
  Administered 2020-09-08 – 2020-09-10 (×8): 1 g via ORAL
  Filled 2020-09-08 (×8): qty 10

## 2020-09-08 MED ORDER — POTASSIUM CHLORIDE CRYS ER 20 MEQ PO TBCR
20.0000 meq | EXTENDED_RELEASE_TABLET | Freq: Once | ORAL | Status: AC
  Administered 2020-09-08: 20 meq via ORAL
  Filled 2020-09-08: qty 1

## 2020-09-08 MED ORDER — ESCITALOPRAM OXALATE 10 MG PO TABS: 10.0000 mg | ORAL_TABLET | Freq: Every day | ORAL | Status: DC

## 2020-09-08 MED ORDER — PROPOFOL 10 MG/ML IV BOLUS
INTRAVENOUS | Status: DC | PRN
  Administered 2020-09-08: 20 mg via INTRAVENOUS

## 2020-09-08 NOTE — Anesthesia Postprocedure Evaluation (Signed)
Anesthesia Post Note  Patient: [name]  Procedure(s) Performed: ESOPHAGOGASTRODUODENOSCOPY (EGD) BIOPSY     Patient location during evaluation: Endoscopy Anesthesia Type: MAC Level of consciousness: awake Pain management: pain level controlled Vital Signs Assessment: post-procedure vital signs reviewed and stable Respiratory status: spontaneous breathing, nonlabored ventilation, respiratory function stable and patient connected to nasal cannula oxygen Cardiovascular status: stable and blood pressure returned to baseline Postop Assessment: no apparent nausea or vomiting Anesthetic complications: no   No notable events documented.  Last Vitals:  Vitals:   09/08/20 1420 09/08/20 1443  BP: 112/67 (!) 149/74  Pulse: 84 87  Resp: 17 16  Temp:  36.9 C  SpO2: 99% 98%    Last Pain:  Vitals:   09/08/20 1443  TempSrc: Oral  PainSc:                  [name]

## 2020-09-08 NOTE — Anesthesia Preprocedure Evaluation (Addendum)
Anesthesia Evaluation  Patient identified by MRN, date of birth, ID band Patient awake    Reviewed: Allergy & Precautions, NPO status , Patient's Chart, lab work & pertinent test results  Airway Mallampati: III  TM Distance: >3 FB Neck ROM: Full    Dental  (+) Edentulous Upper, Edentulous Lower   Pulmonary neg pulmonary ROS,    Pulmonary exam normal breath sounds clear to auscultation       Cardiovascular hypertension, Pt. on medications + CAD, + Cardiac Stents and +CHF  Normal cardiovascular exam Rhythm:Regular Rate:Normal  ECG: rate 88    Neuro/Psych CVA, No Residual Symptoms negative psych ROS   GI/Hepatic negative GI ROS, Neg liver ROS,   Endo/Other  negative endocrine ROSdiabetes  Renal/GU CRFRenal disease     Musculoskeletal  (+) Arthritis , Gout   Abdominal   Peds  Hematology  (+) anemia , HLD   Anesthesia Other Findings weight loss, nausea/vomiting  Reproductive/Obstetrics                            Anesthesia Physical Anesthesia Plan  ASA: 2  Anesthesia Plan: MAC   Post-op Pain Management:    Induction: Intravenous  PONV Risk Score and Plan: 2 and Propofol infusion and Treatment may vary due to age or medical condition  Airway Management Planned: Nasal Cannula  Additional Equipment:   Intra-op Plan:   Post-operative Plan:   Informed Consent: I have reviewed the patients History and Physical, chart, labs and discussed the procedure including the risks, benefits and alternatives for the proposed anesthesia with the patient or authorized representative who has indicated his/her understanding and acceptance.       Plan Discussed with: CRNA  Anesthesia Plan Comments:         Anesthesia Quick Evaluation

## 2020-09-08 NOTE — Transfer of Care (Signed)
Immediate Anesthesia Transfer of Care Note  Patient: [name]  Procedure(s) Performed: ESOPHAGOGASTRODUODENOSCOPY (EGD) BIOPSY  Patient Location: PACU and Endoscopy Unit  Anesthesia Type:MAC  Level of Consciousness: awake, alert , oriented and patient cooperative  Airway & Oxygen Therapy: Patient Spontanous Breathing and Patient connected to face mask oxygen  Post-op Assessment: Report given to RN, Post -op Vital signs reviewed and stable and Patient moving all extremities  Post vital signs: Reviewed and stable  Last Vitals:  Vitals Value Taken Time  BP    Temp    Pulse    Resp    SpO2      Last Pain:  Vitals:   09/08/20 1245  TempSrc:   PainSc: 0-No pain         Complications: No notable events documented.

## 2020-09-08 NOTE — Op Note (Signed)
[facility] Patient Name: [name] Procedure Date: 09/08/2020 MRN: [id number] Attending MD: Ronald Lobo , MD Date of Birth: Nov 17, 1945 CSN: [id number] Age: 75 Admit Type: Inpatient Procedure:                Upper GI endoscopy Indications:              Nausea with vomiting, Weight loss, diarrhea Providers:                Ronald Lobo, MD, Carmie End, RN, Lesia Sago, Technician Referring MD:              Medicines:                Monitored Anesthesia Care Complications:            No immediate complications. Estimated Blood Loss:     Estimated blood loss was minimal. Procedure:                Pre-Anesthesia Assessment:                           - Prior to the procedure, a History and Physical                            was performed, and patient medications and                            allergies were reviewed. The patient's tolerance of                            previous anesthesia was also reviewed. The risks                            and benefits of the procedure and the sedation                            options and risks were discussed with the patient.                            All questions were answered, and informed consent                            was obtained. Prior Anticoagulants: The patient has                            taken Eliquis (apixaban), last dose was 2 days                            prior to procedure. ASA Grade Assessment: II - A                            patient with mild systemic disease. After reviewing  the risks and benefits, the patient was deemed in                            satisfactory condition to undergo the procedure.                           After obtaining informed consent, the endoscope was                            passed under direct vision. Throughout the                            procedure, the patient's blood pressure, pulse, and                             oxygen saturations were monitored continuously. The                            GIF-H190 ([id number]) Olympus gastroscope was                            introduced through the mouth, and advanced to the                            second part of duodenum. The upper GI endoscopy was                            accomplished without difficulty. The patient                            tolerated the procedure well. Scope In: Scope Out: Findings:      The examined esophagus was normal.      A 2 cm hiatal hernia was present.      Diffuse mildly erythematous mucosa was found in the entire examined       stomach. Focal mucosal hemorrhages were present.      Bilious fluid was found in the gastric fundus.      The examined duodenum was normal.      Biopsies were taken with a cold forceps in the gastric fundus, at the       incisura and in the gastric antrum for histology. Impression:               - Normal esophagus.                           - 2 cm hiatal hernia.                           - Erythematous mucosa in the stomach.                           - Bilious gastric fluid.                           - Normal examined duodenum.                           -  Biopsies were taken with a cold forceps for                            histology in the gastric fundus, at the incisura                            and in the gastric antrum. Moderate Sedation:      This patient was sedated with monitored anesthesia care, not moderate       sedation. Recommendation:           - Await pathology results.                           - Use sucralfate suspension 1 gram PO QID for                            possible bile reflux gastritis. Procedure Code(s):        --- Professional ---                           [phone], Esophagogastroduodenoscopy, flexible,                            transoral; with biopsy, single or multiple Diagnosis Code(s):        --- Professional ---                           K31.89, Other  diseases of stomach and duodenum                           R11.2, Nausea with vomiting, unspecified                           R63.4, Abnormal weight loss CPT copyright 2019 American Medical Association. All rights reserved. The codes documented in this report are preliminary and upon coder review may  be revised to meet current compliance requirements. Ronald Lobo, MD 09/08/2020 2:20:13 PM This report has been signed electronically. Number of Addenda: 0

## 2020-09-08 NOTE — Progress Notes (Signed)
Patient's endoscopy was well-tolerated.  The patient does have moderate bile reflux, with endoscopic findings suggestive of possible bile reflux gastritis, including mild to moderate gastric mucosal erythema and focal mucosal hemorrhages.  Biopsies were obtained from the stomach and the duodenum.  In the meantime, I will start the patient on sucralfate suspension as treatment for possible bile reflux gastritis, which can certainly cause nausea although not typically vomiting.  Cleotis Nipper, M.D. Pager [phone] If no answer or after 5 PM call [phone]

## 2020-09-08 NOTE — Progress Notes (Signed)
NUTRITION NOTE  Consult received for gastroparesis diet education.  Patient laying in bed with 3 visitors at bedside. One visitor was on the phone in the room for nearly the entire RD visit; feel this was likely disruptive to patient learning and that further enforcement and conversation concerning gastroparesis and diet recommendations for gastroparesis would be of benefit.   Provided patient with "Gastroparesis Nutrition Therapy" handout from the Academy of [employer]etics. Reviewed this handout with patient in detail, discussed rationale for this diet, and provided the opportunity for questions and further discussion. No questions or concerns expressed at this time.   Patient is currently NPO pending gastric emptying study. She reports that while she has been on a diet during hospitalization appetite has been decreased from baseline but she is unsure of anything that has led to this.   BMI of 26.4 indicates overweight status.   Teach back method used during education and expect fair compliance.   If further nutrition-related needs arise, please re-consult RD.      Jarome Matin, MS, RD, LDN, CNSC Inpatient Clinical Dietitian RD pager # available in [place]  After hours/weekend pager # available in [facility]

## 2020-09-08 NOTE — Progress Notes (Signed)
[facility]  PROGRESS NOTE  [name] GMW:[id number] DOB: Jun 21, 1945 DOA: 09/03/2020 PCP: Seward Carol, MD   Brief HPI:   75 year old female with medical history of diabetes mellitus type 2, hypertension, CKD stage IV, HFpEF with grade 1 diastolic dysfunction, chronic pain syndrome, breast cancer in remission, prior history of Crohn's disease in 2003 came to hospital with 1 month history of nausea and began vomiting 1 day prior to admission.  She was started on PPI and budesonide by GI which did not help her.  Patient was mated for acute kidney injury, persistent nausea and vomiting.    Subjective   Patient seen and examined, s/p EGD done today.  Which showed moderate bile reflux with endoscopic findings suggestive of possible bile reflux gastritis including mild to moderate gastric mucosal erythema and focal mucosal hemorrhages.   Assessment/Plan:    Nausea/vomiting -Patient has history of poor p.o. intake, had mild elevation of lipase; lipase came back to normal -Gastroenterology was consulted, plan was to obtain gastric emptying study,  however radiologist did not recommend his inpatient.  Likely will need as outpatient. -EGD showed bile reflux gastritis -Continue Protonix 40 mg daily; sucralfate started by GI today   Diarrhea -Stool for C. difficile PCR was negative   Acute kidney injury on CKD stage IV -Likely from poor p.o. intake and torsemide for diastolic CHF -Renal function is slowly improving, today creatinine is 1.97. -No hydronephrosis seen on CT scan abdomen   Prolonged QTC -EKG obtained today shows QTC 496 -Continue monitoring on telemetry -Magnesium is 1.9 -Potassium is 3.5 today; will give additional K. Dur 20 mEq p.o. x1   Diastolic CHF -Continue to hold torsemide  Diabetes mellitus type 2 -Hold Tradjenta -Continue sliding scale insulin with NovoLog -CBG well controlled  UTI -Urine culture growing yeast -Patient started on  Mycelex  History of left peroneal vein DVT -Patient is currently on apixaban, started in April 2022 for 3 months -Apixaban was held for EGD .Biopsy was obtained; will check with GI when to restart apixaban  Hypertension -Continue Bystolic, isosorbide, amlodipine  History of right hemicolectomy/?  Crohn's disease -Continue budesonide  History of depression -Patient was taking Prozac in the past -Wants to restart SSRI -She came with prolonged QTc interval, SSRIs can prolong QTC -Will monitor QTC on EKG in a.m., if significantly improved we will consider starting Lexapro   Scheduled medications:    amLODipine  10 mg Oral Daily   budesonide  3 mg Oral Daily   clotrimazole  10 mg Oral 5 X Daily   famotidine  10 mg Oral Daily   hydrALAZINE  100 mg Oral TID   insulin aspart  0-5 Units Subcutaneous QHS   insulin aspart  0-9 Units Subcutaneous TID WC   isosorbide mononitrate  15 mg Oral Daily   melatonin  10 mg Oral QHS   nebivolol  10 mg Oral Daily   pantoprazole  40 mg Oral Daily   rosuvastatin  5 mg Oral Daily   sucralfate  1 g Oral TID WC & HS         Data Reviewed:   CBG:  Recent Labs  Lab 09/07/20 1404 09/07/20 1708 09/07/20 2144 09/08/20 0745 09/08/20 1255  GLUCAP 133* 108* 94 97 90    SpO2: 98 %    Vitals:   09/08/20 1408 09/08/20 1410 09/08/20 1420 09/08/20 1443  BP: (!) 109/42 120/89 112/67 (!) 149/74  Pulse: 79 82 84 87  Resp: 16 20 17  16  Temp: 98.8 F (37.1 C)   98.5 F (36.9 C)  TempSrc: Oral   Oral  SpO2: 100% 99% 99% 98%  Weight:      Height:         Intake/Output Summary (Last 24 hours) at 09/08/2020 1623 Last data filed at 09/08/2020 1404 Gross per 24 hour  Intake 424.37 ml  Output --  Net 424.37 ml    No intake/output data recorded.  Filed Weights   09/03/20 1557 09/03/20 2219 09/08/20 1228  Weight: 68.3 kg 69.7 kg 69.7 kg    CBC:  Recent Labs  Lab 09/03/20 1919 09/04/20 0646 09/05/20 0632 09/06/20 0553  WBC 9.9  7.9 7.7 7.0  HGB 12.2 10.8* 10.9* 10.5*  HCT 38.1 33.9* 34.3* 32.8*  PLT 286 230 223 224  MCV 84.7 86.5 86.6 86.5  MCH 27.1 27.6 27.5 27.7  MCHC 32.0 31.9 31.8 32.0  RDW 15.2 15.0 14.8 15.0  LYMPHSABS 2.1  --  1.1 1.1  MONOABS 0.7  --  0.5 0.5  EOSABS 0.0  --  0.0 0.0  BASOSABS 0.0  --  0.0 0.0    Complete metabolic panel:  Recent Labs  Lab 09/03/20 1919 09/04/20 0646 09/04/20 1135 09/05/20 0632 09/06/20 0553 09/08/20 0633  NA 138 140  --  141 142 143  K 3.8 3.2*  --  3.3* 3.6 3.5  CL 103 109  --  108 111 111  CO2 21* 22  --  23 23 23   GLUCOSE 105* 79  --  88 102* 96  BUN 46* 45*  --  33* 28* 20  CREATININE 3.56* 2.76*  --  1.92* 1.67* 1.97*  CALCIUM 10.0 9.0  --  9.2 9.3 9.4  AST 31  --  37 39 33  --   ALT 28  --  32 33 30  --   ALKPHOS 63  --  58 62 57  --   BILITOT 0.5  --  0.6 0.7 0.4  --   ALBUMIN 3.9  --  3.1* 3.1* 3.0*  --   MG  --   --  1.5* 1.3* 1.8 1.9  HGBA1C  --  5.5  --   --   --   --     Recent Labs  Lab 09/03/20 1919 09/04/20 1135 09/05/20 0632  LIPASE 186* 105* 76*    Recent Labs  Lab 09/03/20 2009  SARSCOV2NAA NEGATIVE    ------------------------------------------------------------------------------------------------------------------ No results for input(s): CHOL, HDL, LDLCALC, TRIG, CHOLHDL, LDLDIRECT in the last 72 hours.  Lab Results  Component Value Date   HGBA1C 5.5 09/04/2020   ------------------------------------------------------------------------------------------------------------------ No results for input(s): TSH, T4TOTAL, T3FREE, THYROIDAB in the last 72 hours.  Invalid input(s): FREET3 ------------------------------------------------------------------------------------------------------------------ No results for input(s): VITAMINB12, FOLATE, FERRITIN, TIBC, IRON, RETICCTPCT in the last 72 hours.  Coagulation profile No results for input(s): INR, PROTIME in the last 168 hours. No results for input(s): DDIMER in the  last 72 hours.  Cardiac Enzymes No results for input(s): CKTOTAL, CKMB, CKMBINDEX, TROPONINI in the last 168 hours.  ------------------------------------------------------------------------------------------------------------------    Component Value Date/Time   BNP 195.0 (H) 07/20/2020 1530     Antibiotics: Anti-infectives (From admission, onward)    None        Radiology Reports  No results found.    DVT prophylaxis: Apixaban  Code Status: Full code  Family Communication: No family at bedside   Consultants: Gastroenterology  Procedures:     Objective    Physical Examination:  General-appears  in no acute distress Heart-S1-S2, regular, no murmur auscultated Lungs-clear to auscultation bilaterally, no wheezing or crackles auscultated Abdomen-soft, nontender, no organomegaly Extremities-no edema in the lower extremities Neuro-alert, oriented x3, no focal deficit noted  Status is: Inpatient  Dispo: The patient is from: Home              Anticipated d/c is to: Home              Anticipated d/c date is: 09/09/2020              Patient currently not stable for discharge  Barrier to discharge-ongoing evaluation for abdominal pain, nausea /vomiting, diarrhea  COVID-19 Labs  No results for input(s): DDIMER, FERRITIN, LDH, CRP in the last 72 hours.  Lab Results  Component Value Date   [place] NEGATIVE 09/03/2020   [place] NEGATIVE 08/17/2020   [place] NEGATIVE 07/20/2020   [place] NEGATIVE 06/28/2020    Microbiology  Recent Results (from the past 240 hour(s))  Urine culture     Status: Abnormal   Collection Time: 09/03/20  8:06 PM   Specimen: Urine, Clean Catch  Result Value Ref Range Status   Specimen Description   Final    URINE, CLEAN CATCH Performed at [facility], [address], [place], [place] [address]    Special Requests   Final    NONE Performed at [facility], [address], [place],  [address]    Culture >=100,000 COLONIES/mL YEAST (A)  Final   Report Status 09/07/2020 FINAL  Final  Resp Panel by RT-PCR (Flu A&B, Covid) Nasopharyngeal Swab     Status: None   Collection Time: 09/03/20  8:09 PM   Specimen: Nasopharyngeal Swab; Nasopharyngeal(NP) swabs in vial transport medium  Result Value Ref Range Status   SARS Coronavirus 2 by RT PCR NEGATIVE NEGATIVE Final    Comment: (NOTE) SARS-CoV-2 target nucleic acids are NOT DETECTED.  The SARS-CoV-2 RNA is generally detectable in upper respiratory specimens during the acute phase of infection. The lowest concentration of SARS-CoV-2 viral copies this assay can detect is 138 copies/mL. A negative result does not preclude SARS-Cov-2 infection and should not be used as the sole basis for treatment or other patient management decisions. A negative result may occur with  improper specimen collection/handling, submission of specimen other than nasopharyngeal swab, presence of viral mutation(s) within the areas targeted by this assay, and inadequate number of viral copies(<138 copies/mL). A negative result must be combined with clinical observations, patient history, and epidemiological information. The expected result is Negative.  Fact Sheet for Patients:  EntrepreneurPulse.com.au  Fact Sheet for Healthcare Providers:  IncredibleEmployment.be  This test is no t yet approved or cleared by the [place] FDA and  has been authorized for detection and/or diagnosis of SARS-CoV-2 by FDA under an Emergency Use Authorization (EUA). This EUA will remain  in effect (meaning this test can be used) for the duration of the COVID-19 declaration under Section 564(b)(1) of the Act, 21 U.S.C.section 360bbb-3(b)(1), unless the authorization is terminated  or revoked sooner.       Influenza A by PCR NEGATIVE NEGATIVE Final   Influenza B by PCR NEGATIVE NEGATIVE Final     Comment: (NOTE) The Xpert Xpress SARS-CoV-2/FLU/RSV plus assay is intended as an aid in the diagnosis of influenza from Nasopharyngeal swab specimens and should not be used as a sole basis for treatment. Nasal washings and aspirates are unacceptable for Xpert Xpress SARS-CoV-2/FLU/RSV testing.  Fact Sheet for Patients:  EntrepreneurPulse.com.au  Fact Sheet for Healthcare Providers: IncredibleEmployment.be  This test is not yet approved or cleared by the [place] FDA and has been authorized for detection and/or diagnosis of SARS-CoV-2 by FDA under an Emergency Use Authorization (EUA). This EUA will remain in effect (meaning this test can be used) for the duration of the COVID-19 declaration under Section 564(b)(1) of the Act, 21 U.S.C. section 360bbb-3(b)(1), unless the authorization is terminated or revoked.  Performed at [facility], [place], [place] [address]   C Difficile Quick Screen (NO PCR Reflex)     Status: None   Collection Time: 09/07/20  3:46 PM   Specimen: STOOL  Result Value Ref Range Status   C Diff antigen NEGATIVE NEGATIVE Final   C Diff toxin NEGATIVE NEGATIVE Final   C Diff interpretation No C. difficile detected.  Final    Comment: Performed at [facility], [place] [address], [place],  [address]             Oswald Hillock   [facility] If 7PM-7AM, please contact night-coverage at [email or web address], Office  [phone]   09/08/2020, 4:23 PM  LOS: 5 days

## 2020-09-08 NOTE — Interval H&P Note (Signed)
History and Physical Interval Note:  09/08/2020 1:18 PM  [name]  has presented today for surgery, with the diagnosis of weight loss, nausea/vomiting.  The various methods of treatment have been discussed with the patient and family. After consideration of risks, benefits and other options for treatment, the patient has consented to  Procedure(s): ESOPHAGOGASTRODUODENOSCOPY (EGD) (N/A) as a surgical intervention.  The patient's history has been reviewed, patient examined, no change in status, stable for surgery.  I have reviewed the patient's chart and labs.  Questions were answered to the patient's satisfaction.     [name]

## 2020-09-09 ENCOUNTER — Other Ambulatory Visit: Payer: Medicare Other

## 2020-09-09 ENCOUNTER — Ambulatory Visit: Payer: Medicare Other

## 2020-09-09 DIAGNOSIS — K2960 Other gastritis without bleeding: Secondary | ICD-10-CM

## 2020-09-09 LAB — CBC
HCT: 35.3 % — ABNORMAL LOW (ref 36.0–46.0)
Hemoglobin: 11 g/dL — ABNORMAL LOW (ref 12.0–15.0)
MCH: 27.2 pg (ref 26.0–34.0)
MCHC: 31.2 g/dL (ref 30.0–36.0)
MCV: 87.4 fL (ref 80.0–100.0)
Platelets: 255 10*3/uL (ref 150–400)
RBC: 4.04 MIL/uL (ref 3.87–5.11)
RDW: 15.2 % (ref 11.5–15.5)
WBC: 8.2 10*3/uL (ref 4.0–10.5)
nRBC: 0 % (ref 0.0–0.2)

## 2020-09-09 LAB — GLUCOSE, CAPILLARY
Glucose-Capillary: 68 mg/dL — ABNORMAL LOW (ref 70–99)
Glucose-Capillary: 91 mg/dL (ref 70–99)
Glucose-Capillary: 110 mg/dL — ABNORMAL HIGH (ref 70–99)
Glucose-Capillary: 110 mg/dL — ABNORMAL HIGH (ref 70–99)
Glucose-Capillary: 155 mg/dL — ABNORMAL HIGH (ref 70–99)

## 2020-09-09 LAB — BASIC METABOLIC PANEL
Anion gap: 10 (ref 5–15)
BUN: 18 mg/dL (ref 8–23)
CO2: 21 mmol/L — ABNORMAL LOW (ref 22–32)
Calcium: 9.2 mg/dL (ref 8.9–10.3)
Chloride: 111 mmol/L (ref 98–111)
Creatinine, Ser: 1.55 mg/dL — ABNORMAL HIGH (ref 0.44–1.00)
GFR, Estimated: 35 mL/min — ABNORMAL LOW (ref >60)
Glucose, Bld: 112 mg/dL — ABNORMAL HIGH (ref 70–99)
Potassium: 3.5 mmol/L (ref 3.5–5.1)
Sodium: 142 mmol/L (ref 135–145)

## 2020-09-09 LAB — SURGICAL PATHOLOGY

## 2020-09-09 MED ORDER — LOPERAMIDE HCL 2 MG PO CAPS
2.0000 mg | ORAL_CAPSULE | ORAL | Status: DC | PRN
  Administered 2020-09-09 (×2): 2 mg via ORAL
  Filled 2020-09-09 (×2): qty 1

## 2020-09-09 MED ORDER — POTASSIUM CHLORIDE CRYS ER 20 MEQ PO TBCR
20.0000 meq | EXTENDED_RELEASE_TABLET | Freq: Once | ORAL | Status: AC
  Administered 2020-09-09: 20 meq via ORAL
  Filled 2020-09-09: qty 1

## 2020-09-09 MED ORDER — APIXABAN 5 MG PO TABS
5.0000 mg | ORAL_TABLET | Freq: Two times a day (BID) | ORAL | Status: DC
  Administered 2020-09-09 – 2020-09-10 (×3): 5 mg via ORAL
  Filled 2020-09-09 (×3): qty 1

## 2020-09-09 NOTE — Progress Notes (Signed)
Bx's from yesterday's egd show reactive gastropathy, supporting the diagnosis of bile reflux gastritis. (Pt advised.)  No obvious response to sucralfate so far, although she is only had a few doses.  Pt having diarrhea.  I wonder if this could be a consequence of her remote right hemicolectomy.  Discussed w/ Dr. Darrick Meigs:   C. Diff negative, agree w/ plan for Imodium.  Could also consider trial of cholestyramine, but the dosing convenience and palatability of that medication compared to Imodium are considerably less, so let's see how the Imodium works.  Recommendations: Continue current management.  Dr. Michail Sermon will see patient tomorrow.  Cleotis Nipper, M.D. Pager [phone] If no answer or after 5 PM call [phone]

## 2020-09-09 NOTE — Progress Notes (Signed)
[facility]  PROGRESS NOTE  [name] MLY:[id number] DOB: 22-May-1945 DOA: 09/03/2020 PCP: Seward Carol, MD   Brief HPI:   75 year old female with medical history of diabetes mellitus type 2, hypertension, CKD stage IV, HFpEF with grade 1 diastolic dysfunction, chronic pain syndrome, breast cancer in remission, prior history of Crohn's disease in 2003 came to hospital with 1 month history of nausea and began vomiting 1 day prior to admission.  She was started on PPI and budesonide by GI which did not help her.  Patient was mated for acute kidney injury, persistent nausea and vomiting.  s/p EGD; showed moderate bile reflux with endoscopic findings suggestive of possible bile reflux gastritis including mild to moderate gastric mucosal erythema and focal mucosal hemorrhages.  Subjective   Patient seen and examined, complains of diarrhea.  Stool for C. difficile PCR was negative.  EGD done yesterday shows bile reflux gastritis   Assessment/Plan:    Nausea/vomiting -Patient has history of poor p.o. intake, had mild elevation of lipase; lipase came back to normal -Gastroenterology was consulted, plan was to obtain gastric emptying study,  however radiologist did not recommend his inpatient.  Likely will need as outpatient. -EGD showed bile reflux gastritis -Continue Protonix 40 mg daily; sucralfate started by GI today   Diarrhea -Stool for C. difficile PCR was negative -Start Imodium 2 mg daily as needed   Acute kidney injury on CKD stage IV -Likely from poor p.o. intake and torsemide for diastolic CHF -Renal function is slowly improving, today creatinine is 1.55 -No hydronephrosis seen on CT scan abdomen   Prolonged QTC -EKG obtained today shows QTC 496 -Continue monitoring on telemetry -Magnesium is 1.9 -Potassium is 3.5 today; will give additional K. Dur 20 mEq p.o. x1   Diastolic CHF -Continue to hold torsemide  Diabetes mellitus type 2 -Hold  Tradjenta -Continue sliding scale insulin with NovoLog -CBG well controlled  UTI -Urine culture growing yeast -Patient started on Mycelex  History of left peroneal vein DVT -Patient is currently on apixaban, started in April 2022 for 3 months -Apixaban was held for EGD .Biopsy was obtained; checked with GI, will restart apixaban.  Hypertension -Continue Bystolic, isosorbide, amlodipine  History of right hemicolectomy/?  Crohn's disease -Continue budesonide  History of depression -Patient was taking Prozac in the past -Wants to restart SSRI -She came with prolonged QTc interval, SSRIs can prolong QTC -Will avoid starting Lexapro due to GI issues, diarrhea and prolonged QTC. -Consider starting SSRI as outpatient once patient's active issues have resolved   Scheduled medications:    amLODipine  10 mg Oral Daily   apixaban  5 mg Oral BID   budesonide  3 mg Oral Daily   clotrimazole  10 mg Oral 5 X Daily   famotidine  10 mg Oral Daily   hydrALAZINE  100 mg Oral TID   insulin aspart  0-5 Units Subcutaneous QHS   insulin aspart  0-9 Units Subcutaneous TID WC   isosorbide mononitrate  15 mg Oral Daily   melatonin  10 mg Oral QHS   nebivolol  10 mg Oral Daily   pantoprazole  40 mg Oral Daily   rosuvastatin  5 mg Oral Daily   sucralfate  1 g Oral TID WC & HS         Data Reviewed:   CBG:  Recent Labs  Lab 09/08/20 1709 09/08/20 2026 09/09/20 0756 09/09/20 0832 09/09/20 1210  GLUCAP 78 73 68* 91 110*    SpO2: 97 %  Vitals:   09/08/20 1443 09/08/20 2025 09/09/20 0500 09/09/20 1405  BP: (!) 149/74 133/64 (!) 145/62 (!) 143/75  Pulse: 87 89 82 82  Resp: 16 18 17 16   Temp: 98.5 F (36.9 C) 98.7 F (37.1 C) 99.1 F (37.3 C) 98.1 F (36.7 C)  TempSrc: Oral Oral Oral Oral  SpO2: 98% 98% 99% 97%  Weight:      Height:        No intake or output data in the 24 hours ending 09/09/20 1420   06/22 1901 - 06/24 0700 In: 424.4 [I.V.:424.4] Out: -    Filed Weights   09/03/20 1557 09/03/20 2219 09/08/20 1228  Weight: 68.3 kg 69.7 kg 69.7 kg    CBC:  Recent Labs  Lab 09/03/20 1919 09/04/20 0646 09/05/20 0632 09/06/20 0553 09/09/20 0900  WBC 9.9 7.9 7.7 7.0 8.2  HGB 12.2 10.8* 10.9* 10.5* 11.0*  HCT 38.1 33.9* 34.3* 32.8* 35.3*  PLT 286 230 223 224 255  MCV 84.7 86.5 86.6 86.5 87.4  MCH 27.1 27.6 27.5 27.7 27.2  MCHC 32.0 31.9 31.8 32.0 31.2  RDW 15.2 15.0 14.8 15.0 15.2  LYMPHSABS 2.1  --  1.1 1.1  --   MONOABS 0.7  --  0.5 0.5  --   EOSABS 0.0  --  0.0 0.0  --   BASOSABS 0.0  --  0.0 0.0  --     Complete metabolic panel:  Recent Labs  Lab 09/03/20 1919 09/04/20 0646 09/04/20 1135 09/05/20 0632 09/06/20 0553 09/08/20 0633 09/09/20 0900  NA 138 140  --  141 142 143 142  K 3.8 3.2*  --  3.3* 3.6 3.5 3.5  CL 103 109  --  108 111 111 111  CO2 21* 22  --  23 23 23  21*  GLUCOSE 105* 79  --  88 102* 96 112*  BUN 46* 45*  --  33* 28* 20 18  CREATININE 3.56* 2.76*  --  1.92* 1.67* 1.97* 1.55*  CALCIUM 10.0 9.0  --  9.2 9.3 9.4 9.2  AST 31  --  37 39 33  --   --   ALT 28  --  32 33 30  --   --   ALKPHOS 63  --  58 62 57  --   --   BILITOT 0.5  --  0.6 0.7 0.4  --   --   ALBUMIN 3.9  --  3.1* 3.1* 3.0*  --   --   MG  --   --  1.5* 1.3* 1.8 1.9  --   HGBA1C  --  5.5  --   --   --   --   --     Recent Labs  Lab 09/03/20 1919 09/04/20 1135 09/05/20 0632  LIPASE 186* 105* 76*    Recent Labs  Lab 09/03/20 2009  SARSCOV2NAA NEGATIVE    ------------------------------------------------------------------------------------------------------------------ No results for input(s): CHOL, HDL, LDLCALC, TRIG, CHOLHDL, LDLDIRECT in the last 72 hours.  Lab Results  Component Value Date   HGBA1C 5.5 09/04/2020   ------------------------------------------------------------------------------------------------------------------ No results for input(s): TSH, T4TOTAL, T3FREE, THYROIDAB in the last 72 hours.  Invalid  input(s): FREET3 ------------------------------------------------------------------------------------------------------------------ No results for input(s): VITAMINB12, FOLATE, FERRITIN, TIBC, IRON, RETICCTPCT in the last 72 hours.  Coagulation profile No results for input(s): INR, PROTIME in the last 168 hours. No results for input(s): DDIMER in the last 72 hours.  Cardiac Enzymes No results for input(s): CKTOTAL, CKMB, CKMBINDEX, TROPONINI in the  last 168 hours.  ------------------------------------------------------------------------------------------------------------------    Component Value Date/Time   BNP 195.0 (H) 07/20/2020 1530     Antibiotics: Anti-infectives (From admission, onward)    None        Radiology Reports  No results found.    DVT prophylaxis: Apixaban  Code Status: Full code  Family Communication: No family at bedside   Consultants: Gastroenterology  Procedures:     Objective    Physical Examination:  General-appears in no acute distress Heart-S1-S2, regular, no murmur auscultated Lungs-clear to auscultation bilaterally, no wheezing or crackles auscultated Abdomen-soft, nontender, no organomegaly Extremities-no edema in the lower extremities Neuro-alert, oriented x3, no focal deficit noted  Status is: Inpatient  Dispo: The patient is from: Home              Anticipated d/c is to: Home              Anticipated d/c date is: 09/10/2020              Patient currently not stable for discharge  Barrier to discharge-ongoing evaluation for abdominal pain, nausea /vomiting, diarrhea  COVID-19 Labs  No results for input(s): DDIMER, FERRITIN, LDH, CRP in the last 72 hours.  Lab Results  Component Value Date   [place] NEGATIVE 09/03/2020   [place] NEGATIVE 08/17/2020   [place] NEGATIVE 07/20/2020   [place] NEGATIVE 06/28/2020    Microbiology  Recent Results (from the past 240 hour(s))  Urine culture      Status: Abnormal   Collection Time: 09/03/20  8:06 PM   Specimen: Urine, Clean Catch  Result Value Ref Range Status   Specimen Description   Final    URINE, CLEAN CATCH Performed at [facility], [address], [place], [place] [address]    Special Requests   Final    NONE Performed at [facility], [address] [place], [place] [address]    Culture >=100,000 COLONIES/mL YEAST (A)  Final   Report Status 09/07/2020 FINAL  Final  Resp Panel by RT-PCR (Flu A&B, Covid) Nasopharyngeal Swab     Status: None   Collection Time: 09/03/20  8:09 PM   Specimen: Nasopharyngeal Swab; Nasopharyngeal(NP) swabs in vial transport medium  Result Value Ref Range Status   SARS Coronavirus 2 by RT PCR NEGATIVE NEGATIVE Final    Comment: (NOTE) SARS-CoV-2 target nucleic acids are NOT DETECTED.  The SARS-CoV-2 RNA is generally detectable in upper respiratory specimens during the acute phase of infection. The lowest concentration of SARS-CoV-2 viral copies this assay can detect is 138 copies/mL. A negative result does not preclude SARS-Cov-2 infection and should not be used as the sole basis for treatment or other patient management decisions. A negative result may occur with  improper specimen collection/handling, submission of specimen other than nasopharyngeal swab, presence of viral mutation(s) within the areas targeted by this assay, and inadequate number of viral copies(<138 copies/mL). A negative result must be combined with clinical observations, patient history, and epidemiological information. The expected result is Negative.  Fact Sheet for Patients:  EntrepreneurPulse.com.au  Fact Sheet for Healthcare Providers:  IncredibleEmployment.be  This test is no t yet approved or cleared by the [place] FDA and  has been authorized for detection and/or diagnosis of SARS-CoV-2 by FDA under an Emergency Use  Authorization (EUA). This EUA will remain  in effect (meaning this test can be used) for the duration of the COVID-19 declaration under Section 564(b)(1) of the Act, 21 U.S.C.section 360bbb-3(b)(1), unless the authorization  is terminated  or revoked sooner.       Influenza A by PCR NEGATIVE NEGATIVE Final   Influenza B by PCR NEGATIVE NEGATIVE Final    Comment: (NOTE) The Xpert Xpress SARS-CoV-2/FLU/RSV plus assay is intended as an aid in the diagnosis of influenza from Nasopharyngeal swab specimens and should not be used as a sole basis for treatment. Nasal washings and aspirates are unacceptable for Xpert Xpress SARS-CoV-2/FLU/RSV testing.  Fact Sheet for Patients: EntrepreneurPulse.com.au  Fact Sheet for Healthcare Providers: IncredibleEmployment.be  This test is not yet approved or cleared by the [place] FDA and has been authorized for detection and/or diagnosis of SARS-CoV-2 by FDA under an Emergency Use Authorization (EUA). This EUA will remain in effect (meaning this test can be used) for the duration of the COVID-19 declaration under Section 564(b)(1) of the Act, 21 U.S.C. section 360bbb-3(b)(1), unless the authorization is terminated or revoked.  Performed at [facility], [place], [place] [address]   C Difficile Quick Screen (NO PCR Reflex)     Status: None   Collection Time: 09/07/20  3:46 PM   Specimen: STOOL  Result Value Ref Range Status   C Diff antigen NEGATIVE NEGATIVE Final   C Diff toxin NEGATIVE NEGATIVE Final   C Diff interpretation No C. difficile detected.  Final    Comment: Performed at [facility], [place] [address] [place],  [address]             Oswald Hillock   [facility] If 7PM-7AM, please contact night-coverage at [email or web address], Office  [phone]   09/09/2020, 2:20 PM  LOS: 6 days

## 2020-09-09 NOTE — Progress Notes (Signed)
ANTICOAGULATION CONSULT NOTE - Initial Consult  Pharmacy Consult for apixaban Indication: history of DVT  Allergies  Allergen Reactions   Percocet [Oxycodone-Acetaminophen] Other (See Comments)    headaches   Diazepam Other (See Comments)    REACTION: Agitation Other reaction(s): agitation   Haloperidol Lactate Nausea And Vomiting   Lorazepam Nausea Only    Other reaction(s): nausea   Morphine Sulfate Other (See Comments)    REACTION: Agitation   Propoxyphene Hcl Nausea And Vomiting   Tramadol Hcl Nausea And Vomiting   Anesthetics, Halogenated     Per patient's daughter, she can not tolerate anesthetics.    Etomidate     Heart stopped during anesthesia   Fentanyl     Heart stopped during anesthesia    Haloperidol     Other reaction(s): nausea & vomitin   Morphine Sulfate     Other reaction(s): nausea   Oxycodone-Acetaminophen     Other reaction(s): headaches   Propoxyphene     Other reaction(s): nausea & vomiting   Tramadol Hcl     Other reaction(s): nausea & vomitin Other reaction(s): nausea & vomitin   Versed [Midazolam]     Heart stopped during anesthesia    Patient Measurements: Height: 5' 4"  (162.6 cm) Weight: 69.7 kg (153 lb 10.6 oz) IBW/kg (Calculated) : 54.7  Vital Signs: Temp: 99.1 F (37.3 C) (06/24 0500) Temp Source: Oral (06/24 0500) BP: 145/62 (06/24 0500) Pulse Rate: 82 (06/24 0500)  Labs: Recent Labs    09/08/20 0633 09/09/20 0900  HGB  --  11.0*  HCT  --  35.3*  PLT  --  255  CREATININE 1.97* 1.55*    Estimated Creatinine Clearance: 31 mL/min (A) (by C-G formula based on SCr of 1.55 mg/dL (H)).   Medical History: Past Medical History:  Diagnosis Date   Abdominal discomfort    Chronic N/V/D. Presumptive dx Crohn's dx per elevated p ANCA. Failed Entocort and Pentasa. Sep 2003 - ileocolectomy c anastomosis per Dr Deon Pilling 2/2 adhesions - path was hegative for Crohns. EGD, Sm bowel follow through (11/03), and an eteroclysis (10/03) were  unrevealing. Cuases hypomag and hypocalcemia.   Adnexal mass 8/03   s/p lap BSO (R ovarian fibroma) & lysis of adhesions   Allergy    Seasonal   Anemia    Multifactorial. Baseline HgB 10-11 ish. B12 def - 150 in 3/10. Fe Def - ferritin 35 3/10. Both are being repleted.   Breast cancer ([place]) 03/16/13   right, 5 o'clock   CAD (coronary artery disease) 1996   1996 - PTCA and angioplasty diagonal branch. 2000 - Rotoblator & angiopllasty of diagonal. 2006 - subendocardial AMI, DES to proximal LAD.Marland Kitchen Also had 90% stenosis in distal apical LAD. EF 55 with apical hypokinesis. Indefinite ASA and Plavix.   CHF (congestive heart failure) (HCC)    Chronic kidney disease    Chronic renal insuff baseline Cr 1.2 - 1.4 ish.   Chronic pain    CT 10/10 = Spinal stenosis L2 - S1.   Diabetes mellitus    Insulin dependent   Gout    Hx of radiation therapy 06/02/13- 07/16/13   right rbeast 4500 cGy 25 sessions, right breast boost 1600 cGy in 8 sessions   Hyperlipidemia    Managed with both a statin and Welchol. Welchol stopped 2014 2/2 cost and started on fenofibrate    Hypertension    2006 B renal arteries patent. 2003 MRA - no RAS. 2003 pheo W/U Dr Hassell Done reportedly negative.  Hypoxia 07/23/2017   Lupus ([place])    Lymphedema of breast    Personal history of radiation therapy 2015   RBBB    Renal vein thrombosis (HCC)    SBO (small bowel obstruction) ([place]) 09/17/2017   Secondary hyperparathyroidism ([place])    Stroke ([place])    Incidental finding MRI 2002 L lacunar infarct   Vitamin B12 deficiency    Vitamin D deficiency    Wears dentures    top    Medications: Apixaban 5 mg PO BID home dose  Assessment: Pt is a 22 yoF with PMH significant for DVT diagnosed in April 2022, currently prescribed apixaban prior to admission. Apixaban was held starting 6/22 for endoscopy done on 6/23. Pharmacy has been consulted to resume apixaban.   Today, 09/09/20 CBC: Hgb slightly low but stable; Plt WNL SCr 1.55, CrCl  ~31 mL/min   Plan:  Resume home dose of apixaban 5 mg PO BID  Pharmacy to sign off. Please re-consult if needed.   Lenis Noon, PharmD 09/09/2020,12:11 PM

## 2020-09-09 NOTE — Plan of Care (Signed)
Pt was able to get a comfortable night of sleep; no s/s of acute distress or pain reported or observed; call light within reach and bed at lowest position for safety.

## 2020-09-10 LAB — BASIC METABOLIC PANEL
Anion gap: 8 (ref 5–15)
BUN: 17 mg/dL (ref 8–23)
CO2: 24 mmol/L (ref 22–32)
Calcium: 9.2 mg/dL (ref 8.9–10.3)
Chloride: 109 mmol/L (ref 98–111)
Creatinine, Ser: 1.58 mg/dL — ABNORMAL HIGH (ref 0.44–1.00)
GFR, Estimated: 34 mL/min — ABNORMAL LOW (ref >60)
Glucose, Bld: 88 mg/dL (ref 70–99)
Potassium: 3.5 mmol/L (ref 3.5–5.1)
Sodium: 141 mmol/L (ref 135–145)

## 2020-09-10 LAB — CBC
HCT: 35.8 % — ABNORMAL LOW (ref 36.0–46.0)
Hemoglobin: 11 g/dL — ABNORMAL LOW (ref 12.0–15.0)
MCH: 27 pg (ref 26.0–34.0)
MCHC: 30.7 g/dL (ref 30.0–36.0)
MCV: 88 fL (ref 80.0–100.0)
Platelets: 283 10*3/uL (ref 150–400)
RBC: 4.07 MIL/uL (ref 3.87–5.11)
RDW: 15 % (ref 11.5–15.5)
WBC: 9.7 10*3/uL (ref 4.0–10.5)
nRBC: 0 % (ref 0.0–0.2)

## 2020-09-10 LAB — GLUCOSE, CAPILLARY
Glucose-Capillary: 97 mg/dL (ref 70–99)
Glucose-Capillary: 106 mg/dL — ABNORMAL HIGH (ref 70–99)

## 2020-09-10 MED ORDER — SUCRALFATE 1 GM/10ML PO SUSP: 1.0000 g | Freq: Three times a day (TID) | ORAL | 0 refills | Status: DC

## 2020-09-10 MED ORDER — PANTOPRAZOLE SODIUM 40 MG PO TBEC: 40.0000 mg | DELAYED_RELEASE_TABLET | Freq: Every day | ORAL | 3 refills | Status: DC

## 2020-09-10 MED ORDER — TORSEMIDE 20 MG PO TABS: 20.0000 mg | ORAL_TABLET | Freq: Every day | ORAL | 3 refills | Status: DC

## 2020-09-10 NOTE — Discharge Summary (Signed)
Physician Discharge Summary  [name] GUR:[id number] DOB: Feb 10, 1946 DOA: 09/03/2020  PCP: Seward Carol, MD  Admit date: 09/03/2020 Discharge date: 09/10/2020  Time spent: 60 minutes  Recommendations for Outpatient Follow-up:  Follow-up gastroenterology in 4 weeks   Discharge Diagnoses:  Principal Problem:   Acute renal failure superimposed on stage 4 chronic kidney disease ([place]) Active Problems:   Chronic pain   Diabetic neuropathy associated with type 2 diabetes mellitus (HCC)   Essential hypertension   Chronic diastolic CHF (congestive heart failure) (HCC)   Nausea and vomiting   Discharge Condition: Stable  Diet recommendation: Heart healthy diet  Filed Weights   09/03/20 1557 09/03/20 2219 09/08/20 1228  Weight: 68.3 kg 69.7 kg 69.7 kg    History of present illness:  75 year old female with medical history of diabetes mellitus type 2, hypertension, CKD stage IV, HFpEF with grade 1 diastolic dysfunction, chronic pain syndrome, breast cancer in remission, prior history of Crohn's disease in 2003 came to hospital with 1 month history of nausea and began vomiting 1 day prior to admission.  She was started on PPI and budesonide by GI which did not help her.  Patient was mated for acute kidney injury, persistent nausea and vomiting.   s/p EGD; showed moderate bile reflux with endoscopic findings suggestive of possible bile reflux gastritis including mild to moderate gastric mucosal erythema and focal mucosal hemorrhages.  Hospital Course:   Nausea/vomiting -Patient has history of poor p.o. intake, had mild elevation of lipase; lipase came back to normal -Gastroenterology was consulted, plan was to obtain gastric emptying study,  however radiologist did not recommend his inpatient.  Likely will need as outpatient. -EGD showed bile reflux gastritis -Continue Protonix 40 mg daily; sucralfate started by GI  -Patient will follow up with gastroenterology in 4 to 6  weeks     Diarrhea -Resolved with Imodium -Stool for C. difficile PCR was negative      Acute kidney injury on CKD stage IV -Likely from poor p.o. intake and torsemide 40 mg daily for diastolic CHF -Renal function is slowly improving, today creatinine is 1.58 -No hydronephrosis seen on CT scan abdomen -We will cut down the dose of torsemide to 20 mg p.o. daily     Prolonged QTC -EKG obtained today shows QTC 496 -Continue monitoring on telemetry -Magnesium is 1.9 -Potassium is 3.5      Diastolic CHF -We will cut down the dose of torsemide from 40 mg daily to torsemide 20 mg p.o. daily   Diabetes mellitus type 2 -Continue home medications   UTI -Urine culture growing yeast -Patient completed the course of Mycelex   History of left peroneal vein DVT -Patient is currently on apixaban, started in April 2022 for 3 months -Apixaban was held for EGD  -Biopsy was obtained; checked with GI, will restart apixaban.   Hypertension -Continue Bystolic, isosorbide, amlodipine   History of right hemicolectomy/?  Crohn's disease -Continue budesonide   History of depression -Patient was taking Prozac in the past -Wants to restart SSRI -She came with prolonged QTc interval, SSRIs can prolong QTC -Will avoid starting Lexapro due to GI issues, diarrhea and prolonged QTC. -Consider starting SSRI as outpatient once patient's active issues have resolved  Procedures: EGD  Consultations: Gastroenterology  Discharge Exam: Vitals:   09/09/20 2215 09/10/20 0627  BP: (!) 160/74 (!) 154/85  Pulse: 79 81  Resp: 18 20  Temp: 98.6 F (37 C) 98.1 F (36.7 C)  SpO2: 95% 94%  General: Appears in no acute distress Cardiovascular: S1-S2, regular Respiratory: Clear to auscultation bilaterally  Discharge Instructions   Discharge Instructions     Diet - low sodium heart healthy   Complete by: As directed    Increase activity slowly   Complete by: As directed    Increase  activity slowly   Complete by: As directed       Allergies as of 09/10/2020       Reactions   Percocet [oxycodone-acetaminophen] Other (See Comments)   headaches   Diazepam Other (See Comments)   REACTION: Agitation Other reaction(s): agitation   Haloperidol Lactate Nausea And Vomiting   Lorazepam Nausea Only   Other reaction(s): nausea   Morphine Sulfate Other (See Comments)   REACTION: Agitation   Propoxyphene Hcl Nausea And Vomiting   Tramadol Hcl Nausea And Vomiting   Anesthetics, Halogenated    Per patient's daughter, she can not tolerate anesthetics.    Etomidate    Heart stopped during anesthesia   Fentanyl    Heart stopped during anesthesia   Haloperidol    Other reaction(s): nausea & vomitin   Morphine Sulfate    Other reaction(s): nausea   Oxycodone-acetaminophen    Other reaction(s): headaches   Propoxyphene    Other reaction(s): nausea & vomiting   Tramadol Hcl    Other reaction(s): nausea & vomitin Other reaction(s): nausea & vomitin   Versed [midazolam]    Heart stopped during anesthesia        Medication List     STOP taking these medications    clotrimazole 10 MG troche Commonly known as: MYCELEX       TAKE these medications    allopurinol 100 MG tablet Commonly known as: ZYLOPRIM Take 0.5 tablets (50 mg total) by mouth every other day.   amLODipine 10 MG tablet Commonly known as: NORVASC Take 1 tablet (10 mg total) by mouth daily.   apixaban 5 MG Tabs tablet Commonly known as: ELIQUIS Take 1 tablet (5 mg total) by mouth 2 (two) times daily. Start after you finish the starter pack   B-12 COMPLIANCE INJECTION IJ Inject 1 vial as directed every 30 (thirty) days.   budesonide 3 MG 24 hr capsule Commonly known as: ENTOCORT EC Take 1 capsule by mouth once daily   ergocalciferol 1.25 MG (50000 UT) capsule Commonly known as: VITAMIN D2 Take 1 capsule (50,000 Units total) by mouth once a week.   ferrous fumarate 325 (106 Fe) MG  Tabs tablet Commonly known as: HEMOCYTE - 106 mg FE Take 325 mg of iron by mouth daily.   hydrALAZINE 100 MG tablet Commonly known as: APRESOLINE TAKE 1 TABLET BY MOUTH 3  TIMES DAILY   HYDROmorphone 2 MG tablet Commonly known as: Dilaudid Take 1 tablet (2 mg total) by mouth every 6 (six) hours as needed for severe pain.   isosorbide mononitrate 30 MG 24 hr tablet Commonly known as: IMDUR TAKE 1/2 (ONE-HALF) TABLET BY MOUTH ONCE DAILY (PLEASE  SCHEDULE  AN  APPOINTMENT  FOR  FURTHER  REFILLS) What changed: See the new instructions.   linagliptin 5 MG Tabs tablet Commonly known as: TRADJENTA Take 5 mg by mouth daily.   melatonin 5 MG Tabs Take 10 mg by mouth at bedtime.   nebivolol 10 MG tablet Commonly known as: BYSTOLIC Take 1 tablet (10 mg total) by mouth daily.   nitroGLYCERIN 0.4 MG SL tablet Commonly known as: NITROSTAT DISSOLVE 1 TABLET UNDER THE TONGUE EVERY 5 MINUTES AS  NEEDED FOR CHEST PAIN. MAX  OF 3 TABLETS IN 15 MINUTES. CALL 911 IF PAIN PERSISTS. What changed: See the new instructions.   ondansetron 4 MG disintegrating tablet Commonly known as: Zofran ODT Take 1 tablet (4 mg total) by mouth every 8 (eight) hours as needed for nausea or vomiting.   pantoprazole 40 MG tablet Commonly known as: PROTONIX Take 1 tablet (40 mg total) by mouth daily. Start taking on: September 11, 2020   pregabalin 25 MG capsule Commonly known as: Lyrica Take 1 capsule (25 mg total) by mouth 2 (two) times daily.   RETACRIT IJ Inject 1 vial as directed every 30 (thirty) days.   rosuvastatin 5 MG tablet Commonly known as: CRESTOR Take 5 mg by mouth daily.   sucralfate 1 GM/10ML suspension Commonly known as: CARAFATE Take 10 mLs (1 g total) by mouth 4 (four) times daily -  with meals and at bedtime.   torsemide 20 MG tablet Commonly known as: DEMADEX Take 1 tablet (20 mg total) by mouth daily. What changed: how much to take       Allergies  Allergen Reactions    Percocet [Oxycodone-Acetaminophen] Other (See Comments)    headaches   Diazepam Other (See Comments)    REACTION: Agitation Other reaction(s): agitation   Haloperidol Lactate Nausea And Vomiting   Lorazepam Nausea Only    Other reaction(s): nausea   Morphine Sulfate Other (See Comments)    REACTION: Agitation   Propoxyphene Hcl Nausea And Vomiting   Tramadol Hcl Nausea And Vomiting   Anesthetics, Halogenated     Per patient's daughter, she can not tolerate anesthetics.    Etomidate     Heart stopped during anesthesia   Fentanyl     Heart stopped during anesthesia    Haloperidol     Other reaction(s): nausea & vomitin   Morphine Sulfate     Other reaction(s): nausea   Oxycodone-Acetaminophen     Other reaction(s): headaches   Propoxyphene     Other reaction(s): nausea & vomiting   Tramadol Hcl     Other reaction(s): nausea & vomitin Other reaction(s): nausea & vomitin   Versed [Midazolam]     Heart stopped during anesthesia    Follow-up Information     Seward Carol, MD Follow up in 2 week(s).   Specialty: Internal Medicine Contact information: [address] [place] [address] [phone]         Josue Hector, MD .   Specialty: Cardiology Contact information: [phone] [place] [address] [phone]         Otis Brace, MD Follow up in 4 week(s).   Specialty: Gastroenterology Contact information: [address] [address] [phone]                  The results of significant diagnostics from this hospitalization (including imaging, microbiology, ancillary and laboratory) are listed below for reference.    Significant Diagnostic Studies: CT ABDOMEN PELVIS WO CONTRAST  Result Date: 09/03/2020 CLINICAL DATA:  Nausea and vomiting. EXAM: CT ABDOMEN AND PELVIS WITHOUT CONTRAST TECHNIQUE: Multidetector CT imaging of the abdomen and pelvis was performed following the  standard protocol without IV contrast. COMPARISON:  August 17, 2020 FINDINGS: Lower chest: Small hiatal hernia. Postsurgical changes in the right breast. Hepatobiliary: No focal liver abnormality is seen. Status post cholecystectomy. No biliary dilatation. Pancreas: Unremarkable. No pancreatic ductal dilatation or surrounding inflammatory changes. Spleen: Normal in size  without focal abnormality. Adrenals/Urinary Tract: Adrenal glands are unremarkable. Kidneys are normal, without renal calculi, focal lesion, or hydronephrosis. Bladder is unremarkable. Bilateral renal cysts. Stomach/Bowel: Stomach is within normal limits. Sequela of prior right ileocecal anastomosis. No evidence of bowel wall thickening, distention, or inflammatory changes. Vascular/Lymphatic: Aortic atherosclerosis. No enlarged abdominal or pelvic lymph nodes. Reproductive: Status post hysterectomy. No adnexal masses. Other: No abdominal wall hernia or abnormality. No abdominopelvic ascites. Musculoskeletal: No acute or significant osseous findings. IMPRESSION: 1. No evidence of acute abnormalities within the abdomen or pelvis. 2. Small hiatal hernia. 3. Aortic atherosclerosis. Aortic Atherosclerosis (ICD10-I70.0). Electronically Signed   By: Fidela Salisbury M.D.   On: 09/03/2020 19:20   CT ABDOMEN PELVIS WO CONTRAST  Result Date: 08/17/2020 CLINICAL DATA:  Nausea, history of bowel obstructions EXAM: CT ABDOMEN AND PELVIS WITHOUT CONTRAST TECHNIQUE: Multidetector CT imaging of the abdomen and pelvis was performed following the standard protocol without IV contrast. COMPARISON:  July 2020 FINDINGS: Lower chest: Lung bases are clear. There are partially imaged postoperative and probably radiation changes of the right breast. Hepatobiliary: No focal liver abnormality is seen. Status post cholecystectomy. No biliary dilatation. Pancreas: Unremarkable. Spleen: Unremarkable. Adrenals/Urinary Tract: Adrenals, kidneys, and partially distended bladder  are unremarkable. Stomach/Bowel: A small hiatal hernia is present. Stomach is otherwise unremarkable. Bowel is normal in caliber. There are no dilated loops of small bowel or air-fluid levels. There are postsurgical changes of the right colon with an ileocolic anastomosis. Several small bowel loops are closely apposed to the lower abdominal wall ventrally likely reflecting adhesions. Vascular/Lymphatic: Aortic atherosclerosis. No enlarged lymph nodes. Reproductive: Status post hysterectomy. No adnexal masses. Other: No free fluid. Abdominal wall is unremarkable apart from postsurgical changes. Musculoskeletal: Increased density of osseous structures likely reflecting renal osteodystrophy. No acute osseous abnormality. IMPRESSION: No evidence of bowel obstruction. Probable adhesions along the ventral abdominal wall. Small hiatal hernia. Aortic atherosclerosis. Probable renal osteodystrophy. Electronically Signed   By: Macy Mis M.D.   On: 08/17/2020 14:20    Microbiology: Recent Results (from the past 240 hour(s))  Urine culture     Status: Abnormal   Collection Time: 09/03/20  8:06 PM   Specimen: Urine, Clean Catch  Result Value Ref Range Status   Specimen Description   Final    URINE, CLEAN CATCH Performed at [facility], [address] [place], [place] [address]    Special Requests   Final    NONE Performed at [facility], [address], [place], [place] [address]    Culture >=100,000 COLONIES/mL YEAST (A)  Final   Report Status 09/07/2020 FINAL  Final  Resp Panel by RT-PCR (Flu A&B, Covid) Nasopharyngeal Swab     Status: None   Collection Time: 09/03/20  8:09 PM   Specimen: Nasopharyngeal Swab; Nasopharyngeal(NP) swabs in vial transport medium  Result Value Ref Range Status   SARS Coronavirus 2 by RT PCR NEGATIVE NEGATIVE Final    Comment: (NOTE) SARS-CoV-2 target nucleic acids are NOT DETECTED.  The SARS-CoV-2 RNA is generally detectable  in upper respiratory specimens during the acute phase of infection. The lowest concentration of SARS-CoV-2 viral copies this assay can detect is 138 copies/mL. A negative result does not preclude SARS-Cov-2 infection and should not be used as the sole basis for treatment or other patient management decisions. A negative result may occur with  improper specimen collection/handling, submission of specimen other than nasopharyngeal swab, presence of viral mutation(s) within the areas targeted by this assay, and inadequate  number of viral copies(<138 copies/mL). A negative result must be combined with clinical observations, patient history, and epidemiological information. The expected result is Negative.  Fact Sheet for Patients:  EntrepreneurPulse.com.au  Fact Sheet for Healthcare Providers:  IncredibleEmployment.be  This test is no t yet approved or cleared by the [place] FDA and  has been authorized for detection and/or diagnosis of SARS-CoV-2 by FDA under an Emergency Use Authorization (EUA). This EUA will remain  in effect (meaning this test can be used) for the duration of the COVID-19 declaration under Section 564(b)(1) of the Act, 21 U.S.C.section 360bbb-3(b)(1), unless the authorization is terminated  or revoked sooner.       Influenza A by PCR NEGATIVE NEGATIVE Final   Influenza B by PCR NEGATIVE NEGATIVE Final    Comment: (NOTE) The Xpert Xpress SARS-CoV-2/FLU/RSV plus assay is intended as an aid in the diagnosis of influenza from Nasopharyngeal swab specimens and should not be used as a sole basis for treatment. Nasal washings and aspirates are unacceptable for Xpert Xpress SARS-CoV-2/FLU/RSV testing.  Fact Sheet for Patients: EntrepreneurPulse.com.au  Fact Sheet for Healthcare Providers: IncredibleEmployment.be  This test is not yet approved or cleared by the [place] FDA and has  been authorized for detection and/or diagnosis of SARS-CoV-2 by FDA under an Emergency Use Authorization (EUA). This EUA will remain in effect (meaning this test can be used) for the duration of the COVID-19 declaration under Section 564(b)(1) of the Act, 21 U.S.C. section 360bbb-3(b)(1), unless the authorization is terminated or revoked.  Performed at [facility], [place], [place] [address]   C Difficile Quick Screen (NO PCR Reflex)     Status: None   Collection Time: 09/07/20  3:46 PM   Specimen: STOOL  Result Value Ref Range Status   C Diff antigen NEGATIVE NEGATIVE Final   C Diff toxin NEGATIVE NEGATIVE Final   C Diff interpretation No C. difficile detected.  Final    Comment: Performed at [facility], [place] [address], [place], [place] [address]     Labs: Basic Metabolic Panel: Recent Labs  Lab 09/04/20 1135 09/05/20 4656 09/06/20 0553 09/08/20 0633 09/09/20 0900 09/10/20 0629  NA  --  141 142 143 142 141  K  --  3.3* 3.6 3.5 3.5 3.5  CL  --  108 111 111 111 109  CO2  --  23 23 23  21* 24  GLUCOSE  --  88 102* 96 112* 88  BUN  --  33* 28* 20 18 17   CREATININE  --  1.92* 1.67* 1.97* 1.55* 1.58*  CALCIUM  --  9.2 9.3 9.4 9.2 9.2  MG 1.5* 1.3* 1.8 1.9  --   --   PHOS  --  2.4* 2.3*  --   --   --    Liver Function Tests: Recent Labs  Lab 09/03/20 1919 09/04/20 1135 09/05/20 0632 09/06/20 0553  AST 31 37 39 33  ALT 28 32 33 30  ALKPHOS 63 58 62 57  BILITOT 0.5 0.6 0.7 0.4  PROT 7.7 6.6 6.3* 6.4*  ALBUMIN 3.9 3.1* 3.1* 3.0*   Recent Labs  Lab 09/03/20 1919 09/04/20 1135 09/05/20 0632  LIPASE 186* 105* 76*   No results for input(s): AMMONIA in the last 168 hours. CBC: Recent Labs  Lab 09/03/20 1919 09/04/20 0646 09/05/20 0632 09/06/20 0553 09/09/20 0900 09/10/20 0629  WBC 9.9 7.9 7.7 7.0 8.2 9.7  NEUTROABS 7.0  --  6.0 5.3  --   --  HGB 12.2 10.8* 10.9* 10.5* 11.0* 11.0*  HCT 38.1 33.9*  34.3* 32.8* 35.3* 35.8*  MCV 84.7 86.5 86.6 86.5 87.4 88.0  PLT 286 230 223 224 255 283   Cardiac Enzymes: No results for input(s): CKTOTAL, CKMB, CKMBINDEX, TROPONINI in the last 168 hours. BNP: BNP (last 3 results) Recent Labs    06/28/20 2056 07/20/20 1530  BNP 59.9 195.0*    ProBNP (last 3 results) No results for input(s): PROBNP in the last 8760 hours.  CBG: Recent Labs  Lab 09/09/20 1210 09/09/20 1805 09/09/20 2217 09/10/20 0741 09/10/20 1156  GLUCAP 110* 110* 155* 97 106*       Signed:  Oswald Hillock MD.  [facility] 09/10/2020, 2:35 PM

## 2020-09-10 NOTE — Progress Notes (Signed)
[facility] Gastroenterology Progress Note  [name] 75 y.o. December 08, 1945   Subjective: Feels good. Reports eating better. Sitting in bedside chair.  Objective: Vital signs: Vitals:   09/09/20 2215 09/10/20 0627  BP: (!) 160/74 (!) 154/85  Pulse: 79 81  Resp: 18 20  Temp: 98.6 F (37 C) 98.1 F (36.7 C)  SpO2: 95% 94%    Physical Exam: Gen: alert, no acute distress  HEENT: anicteric sclera CV: RRR Chest: CTA B Abd: soft, nontender, nondistended, +BS Ext: no edema  Lab Results: Recent Labs    09/08/20 0633 09/09/20 0900 09/10/20 0629  NA 143 142 141  K 3.5 3.5 3.5  CL 111 111 109  CO2 23 21* 24  GLUCOSE 96 112* 88  BUN 20 18 17   CREATININE 1.97* 1.55* 1.58*  CALCIUM 9.4 9.2 9.2  MG 1.9  --   --    No results for input(s): AST, ALT, ALKPHOS, BILITOT, PROT, ALBUMIN in the last 72 hours. Recent Labs    09/09/20 0900 09/10/20 0629  WBC 8.2 9.7  HGB 11.0* 11.0*  HCT 35.3* 35.8*  MCV 87.4 88.0  PLT 255 283      Assessment/Plan: N/V resolved. Tolerating diet. C. Diff negative. Stable for d/c today from GI standpoint. F/U with Dr. Alessandra [name] in 4-6 weeks or as previously scheduled. Encouraged increased PO intake as an outpt.   [name] 09/10/2020, 1:36 PM  Questions please call [phone] Patient ID: [name], female   DOB: 1945-07-31, 75 y.o.   MRN: [id number]

## 2020-09-12 ENCOUNTER — Other Ambulatory Visit: Payer: Self-pay

## 2020-09-12 ENCOUNTER — Inpatient Hospital Stay: Payer: Medicare Other

## 2020-09-12 ENCOUNTER — Other Ambulatory Visit: Payer: Self-pay | Admitting: *Deleted

## 2020-09-12 ENCOUNTER — Telehealth: Payer: Self-pay | Admitting: Hematology

## 2020-09-12 ENCOUNTER — Encounter: Payer: Self-pay | Admitting: *Deleted

## 2020-09-12 DIAGNOSIS — N189 Chronic kidney disease, unspecified: Secondary | ICD-10-CM

## 2020-09-12 DIAGNOSIS — N184 Chronic kidney disease, stage 4 (severe): Secondary | ICD-10-CM | POA: Diagnosis not present

## 2020-09-12 DIAGNOSIS — D631 Anemia in chronic kidney disease: Secondary | ICD-10-CM | POA: Diagnosis not present

## 2020-09-12 DIAGNOSIS — D464 Refractory anemia, unspecified: Secondary | ICD-10-CM

## 2020-09-12 DIAGNOSIS — I5032 Chronic diastolic (congestive) heart failure: Secondary | ICD-10-CM

## 2020-09-12 DIAGNOSIS — E538 Deficiency of other specified B group vitamins: Secondary | ICD-10-CM | POA: Diagnosis not present

## 2020-09-12 LAB — CBC WITH DIFFERENTIAL (CANCER CENTER ONLY)
Abs Immature Granulocytes: 0.06 10*3/uL (ref 0.00–0.07)
Basophils Absolute: 0 10*3/uL (ref 0.0–0.1)
Basophils Relative: 0 %
Eosinophils Absolute: 0 10*3/uL (ref 0.0–0.5)
Eosinophils Relative: 1 %
HCT: 34.8 % — ABNORMAL LOW (ref 36.0–46.0)
Hemoglobin: 11 g/dL — ABNORMAL LOW (ref 12.0–15.0)
Immature Granulocytes: 1 %
Lymphocytes Relative: 17 %
Lymphs Abs: 1.5 10*3/uL (ref 0.7–4.0)
MCH: 27 pg (ref 26.0–34.0)
MCHC: 31.6 g/dL (ref 30.0–36.0)
MCV: 85.5 fL (ref 80.0–100.0)
Monocytes Absolute: 0.7 10*3/uL (ref 0.1–1.0)
Monocytes Relative: 8 %
Neutro Abs: 6.3 10*3/uL (ref 1.7–7.7)
Neutrophils Relative %: 73 %
Platelet Count: 253 10*3/uL (ref 150–400)
RBC: 4.07 MIL/uL (ref 3.87–5.11)
RDW: 14.7 % (ref 11.5–15.5)
WBC Count: 8.6 10*3/uL (ref 4.0–10.5)
nRBC: 0 % (ref 0.0–0.2)

## 2020-09-12 NOTE — Patient Outreach (Signed)
[facility]) Care Management  [place]  09/12/2020   [name] 1945/04/11 [id number]    Referral Date: 6/27 Referral Source: Hospital liaison Referral Reason: Post hospital discharge Insurance: [employer] attempt #1, successful.  Identity verified.  This care manager introduced self and stated purpose of call.  [facility] care management services explained.    Social: Report she lives alone but has family (daughters and grandchildren) in the area that are able to help when needed.  She is able to still drive herself for errands and to MD appointments.   Conditions: Per chart, history of HTN, CAD, CHF, HLD, DM, CKD, and breast cancer.  Recently in the hospital twice in the last 6 months for nausea/vomiting.  State this has since resided, but had weight loss.  Per chart, nutrition consult has been placed.  Medications: Reviewed with member, state she is taking as directed.  Denies any concern for financial assistance with medications but does report concern with ability to pay for rent, utilities, and food.  Aware that Care Guide will follow up.  Encounter Medications:  Outpatient Encounter Medications as of 09/12/2020  Medication Sig Note   allopurinol (ZYLOPRIM) 100 MG tablet Take 0.5 tablets (50 mg total) by mouth every other day.    amLODipine (NORVASC) 10 MG tablet Take 1 tablet (10 mg total) by mouth daily.    apixaban (ELIQUIS) 5 MG TABS tablet Take 1 tablet (5 mg total) by mouth 2 (two) times daily. Start after you finish the starter pack    budesonide (ENTOCORT EC) 3 MG 24 hr capsule Take 1 capsule by mouth once daily    Cyanocobalamin (B-12 COMPLIANCE INJECTION IJ) Inject 1 vial as directed every 30 (thirty) days.    Epoetin Alfa-epbx (RETACRIT IJ) Inject 1 vial as directed every 30 (thirty) days.    ergocalciferol (VITAMIN D2) 1.25 MG (50000 UT) capsule Take 1 capsule (50,000 Units total) by mouth once a week.    ferrous fumarate (HEMOCYTE -  106 MG FE) 325 (106 Fe) MG TABS tablet Take 325 mg of iron by mouth daily.    hydrALAZINE (APRESOLINE) 100 MG tablet TAKE 1 TABLET BY MOUTH 3  TIMES DAILY    HYDROmorphone (DILAUDID) 2 MG tablet Take 1 tablet (2 mg total) by mouth every 6 (six) hours as needed for severe pain.    isosorbide mononitrate (IMDUR) 30 MG 24 hr tablet TAKE 1/2 (ONE-HALF) TABLET BY MOUTH ONCE DAILY (PLEASE  SCHEDULE  AN  APPOINTMENT  FOR  FURTHER  REFILLS)    linagliptin (TRADJENTA) 5 MG TABS tablet Take 5 mg by mouth daily.    Melatonin 5 MG TABS Take 10 mg by mouth at bedtime.    nebivolol (BYSTOLIC) 10 MG tablet Take 1 tablet (10 mg total) by mouth daily.    nitroGLYCERIN (NITROSTAT) 0.4 MG SL tablet DISSOLVE 1 TABLET UNDER THE TONGUE EVERY 5 MINUTES AS  NEEDED FOR CHEST PAIN. MAX  OF 3 TABLETS IN 15 MINUTES. CALL 911 IF PAIN PERSISTS.    pantoprazole (PROTONIX) 40 MG tablet Take 1 tablet (40 mg total) by mouth daily.    pregabalin (LYRICA) 25 MG capsule Take 1 capsule (25 mg total) by mouth 2 (two) times daily. 09/03/2020: 2 doses    rosuvastatin (CRESTOR) 5 MG tablet Take 5 mg by mouth daily.    sucralfate (CARAFATE) 1 GM/10ML suspension Take 10 mLs (1 g total) by mouth 4 (four) times daily -  with meals and at  bedtime.    torsemide (DEMADEX) 20 MG tablet Take 1 tablet (20 mg total) by mouth daily.    ondansetron (ZOFRAN ODT) 4 MG disintegrating tablet Take 1 tablet (4 mg total) by mouth every 8 (eight) hours as needed for nausea or vomiting. (Patient not taking: Reported on 09/12/2020)    No facility-administered encounter medications on file as of 09/12/2020.    Functional Status:  In your present state of health, do you have any difficulty performing the following activities: 09/03/2020 07/21/2020  Hearing? N N  Vision? N N  Difficulty concentrating or making decisions? N N  Walking or climbing stairs? Y N  Dressing or bathing? N N  Doing errands, shopping? N N  Some recent data might be hidden     Fall/Depression Screening: Fall Risk  09/12/2020 07/19/2020 06/21/2020  Falls in the past year? 1 0 1  Comment - Fell at home April 2022. Tripped over cane left arm pain. -  Number falls in past yr: 1 0 1  Injury with Fall? 0 0 1  Comment - - exaserbated right leg pain  Risk for fall due to : History of fall(s) - -  Follow up - - -   PHQ 2/9 Scores 09/12/2020 07/19/2020 07/23/2017 04/18/2017 12/20/2016 10/16/2016 10/11/2016  PHQ - 2 Score 0 0 0 0 0 0 0    Assessment:   Care Plan Care Plan : Chronic Kidney (Adult)  Updates made by [name], RN since 09/12/2020 12:00 AM     Problem: Disease Progression      Goal: Disease Progression Prevented or Minimized   Start Date: 09/12/2020  Expected End Date: 10/12/2020  Priority: Medium  Note:   Evidence-based guidance:  Discuss potential for disease progression based on clinical diagnosis and/or causation and other risk factors including race, ethnicity, age, comorbidities, lifestyle.  Encourage lifestyle changes including maintaining a healthy weight, exercise and activity, limiting alcohol consumption, smoking cessation to improve long-term outcomes.  Acknowledge difficulty in making life-long behavior change; provide guidance to incorporate lifestyle change into daily life and social activities; provide emotional support, coaching and praise for success.  Anticipate the use of pharmacologic therapy to prevent disease progression and manage complications or comorbidities, such as hypertension, cardiovascular disease, diabetes, anemia, bone disorders, peripheral vascular disease.  Monitor efficacy of pharmacologic therapy; monitor and manage side effects.  Monitor for nephrotoxic medication or supplement use including NSAIDs (nonsteroidal anti-inflammatory drugs), radio-contrast media, oral phosphate-containing bowel preparations, herbal supplements, protein supplements.  Encourage consultation and counseling with pharmacist.  Anticipate and  prepare patient for early nephrology referral if significant increase of ACR (albumin to creatinine ratio), hard-to-manage complications of decreased GFR (glomerular filtration rate) arise.  Anticipate and prepare patient for laboratory studies to stage and/or monitor CKD and to identify and manage complications and comorbidities.  Address barriers to treatment adherence, such as cognitive function, illness, drug interaction, medication cost, presence of depression or anxiety.  Facilitate coordination between nephrologist and primary care provider to ensure continuation of care for co-morbidities and routine preventive care.   Notes:     Task: Alleviate Barriers to Chronic Kidney Disease Treatment   Note:   Care Management Activities:    - barriers to lifestyle change identified - healthy lifestyle promoted    Notes:     Problem: Malnutrition (Undernutrition)      Long-Range Goal: Malnutrition (Undernutrition) Prevented or Managed   Start Date: 09/12/2020  Expected End Date: 12/11/2020  Priority: High  Note:   Evidence-based guidance:  Perform, or refer for, a nutritional assessment, including a nutrition-focused physical exam to determine calorie, protein, vitamin, mineral and fluid requirements, and micronutrient deficiencies.  Evaluate need for diet modification, including weight loss if applicable, sodium and/or protein reduction; minimize unnecessary dietary restriction to increase oral intake.  Encourage adjustment of diet or meal schedule based on preferences and tolerance.  Encourage patient adherence to nutritional plan.  Consider implementation of bowel elimination program to increase comfort and appetite.  Encourage optimal oral hygiene to enhance desire to eat.  Monitor and track weight over time; watch for unintended weight loss.  Correlate presence of depression or anxiety symptoms with ability or desire to follow nutritional guidance; provide or refer for mental health  services.   Notes:     Task: Alleviate Barriers to Optimal Nutrition Status   Due Date: 12/11/2020  Note:   Care Management Activities:    - barriers to eating addressed - diversity of foods encouraged - support and encouragement provided    Notes:       Goals Addressed             This Visit's Progress    [facility] - Make and Keep All Appointments       Timeframe:  Short-Term Goal Priority:  Medium Start Date:     6/27                        Expected End Date:       7/27                Follow Up Date 09/30/2020  Barriers: Financial Knowledge     - ask family or friend for a ride - keep a calendar with appointment dates    Why is this important?   Part of staying healthy is seeing the doctor for follow-up care.  If you forget your appointments, there are some things you can do to stay on track.    Notes:   6/27 - Conference calls placed to schedule follow up appointments. GI scheduled for 7/1, PCP for 7/12, and cardiology for 8/3.  She will drive self to these appointments      [facility] - Manage My Diet       Timeframe:  Long-Range Goal Priority:  High Start Date:   6/27                        Expected End Date:           9/27            Follow Up Date 09/30/2020    - ask for help if I have trouble affording healthy foods - keep healthy snacks on hand - track weight in diary - weigh myself daily    Why is this important?   A healthy diet is important for mental and physical health.  Healthy food helps repair damaged body tissue and maintains strong bones and muscles.  No single food is just right so eating a variety of proteins, fruits, vegetables and grains is best.  You may need to change what you eat or drink to manage kidney disease.  A dietitian is the best person to guide you.     Notes:   6/27 - Discussed recent weight loss (195 pounds in May, currently 145 pounds).  Has follow up with GI.  Report appetite is increasing, N/V is decreasing.  Referral to  Care Guide for  community resources, including food.         Plan:  Follow-up: Patient agrees to Care Plan and Follow-up. Follow-up in 3 week(s).  Will notify PCP of [facility] involvement.  Will place referral to Care Guide.  [name], [place], MSN [place] [phone]

## 2020-09-12 NOTE — Progress Notes (Signed)
I contacted Dr Burr Medico about hemoglobin results being 11 today and confirmed with doctor that the dose can be skipped today.

## 2020-09-12 NOTE — Telephone Encounter (Signed)
R/s appt per 6/24 sch msg. Pt aware.

## 2020-09-13 ENCOUNTER — Telehealth: Payer: Self-pay | Admitting: *Deleted

## 2020-09-16 ENCOUNTER — Ambulatory Visit: Payer: Medicare Other | Admitting: Hematology

## 2020-09-16 ENCOUNTER — Other Ambulatory Visit: Payer: Self-pay | Admitting: Gastroenterology

## 2020-09-16 ENCOUNTER — Other Ambulatory Visit: Payer: Medicare Other

## 2020-09-16 ENCOUNTER — Other Ambulatory Visit (HOSPITAL_COMMUNITY): Payer: Self-pay | Admitting: Gastroenterology

## 2020-09-16 ENCOUNTER — Ambulatory Visit: Payer: Medicare Other

## 2020-09-16 DIAGNOSIS — R197 Diarrhea, unspecified: Secondary | ICD-10-CM | POA: Diagnosis not present

## 2020-09-16 DIAGNOSIS — R110 Nausea: Secondary | ICD-10-CM

## 2020-09-16 DIAGNOSIS — R634 Abnormal weight loss: Secondary | ICD-10-CM | POA: Diagnosis not present

## 2020-09-23 ENCOUNTER — Inpatient Hospital Stay (HOSPITAL_COMMUNITY): Payer: Medicare Other

## 2020-09-23 ENCOUNTER — Other Ambulatory Visit: Payer: Self-pay

## 2020-09-23 ENCOUNTER — Telehealth: Payer: Self-pay | Admitting: Hematology

## 2020-09-23 ENCOUNTER — Inpatient Hospital Stay (HOSPITAL_COMMUNITY)
Admission: EM | Admit: 2020-09-23 | Discharge: 2020-09-27 | DRG: 683 | Disposition: A | Discharge status: Home / Self Care | Payer: Medicare Other | Attending: Internal Medicine | Admitting: Internal Medicine

## 2020-09-23 ENCOUNTER — Inpatient Hospital Stay: Payer: Medicare Other

## 2020-09-23 ENCOUNTER — Inpatient Hospital Stay: Payer: Medicare Other | Attending: Hematology

## 2020-09-23 ENCOUNTER — Other Ambulatory Visit: Payer: Self-pay | Admitting: Hematology

## 2020-09-23 VITALS — BP 115/67 | HR 77 | Temp 98.6°F | Resp 16

## 2020-09-23 DIAGNOSIS — R7989 Other specified abnormal findings of blood chemistry: Secondary | ICD-10-CM | POA: Diagnosis present

## 2020-09-23 DIAGNOSIS — Z8249 Family history of ischemic heart disease and other diseases of the circulatory system: Secondary | ICD-10-CM | POA: Diagnosis not present

## 2020-09-23 DIAGNOSIS — Z800 Family history of malignant neoplasm of digestive organs: Secondary | ICD-10-CM | POA: Diagnosis not present

## 2020-09-23 DIAGNOSIS — N179 Acute kidney failure, unspecified: Principal | ICD-10-CM

## 2020-09-23 DIAGNOSIS — E872 Acidosis: Secondary | ICD-10-CM | POA: Diagnosis not present

## 2020-09-23 DIAGNOSIS — E860 Dehydration: Secondary | ICD-10-CM | POA: Diagnosis present

## 2020-09-23 DIAGNOSIS — Z803 Family history of malignant neoplasm of breast: Secondary | ICD-10-CM

## 2020-09-23 DIAGNOSIS — R112 Nausea with vomiting, unspecified: Secondary | ICD-10-CM | POA: Diagnosis present

## 2020-09-23 DIAGNOSIS — G8929 Other chronic pain: Secondary | ICD-10-CM | POA: Diagnosis not present

## 2020-09-23 DIAGNOSIS — R7401 Elevation of levels of liver transaminase levels: Secondary | ICD-10-CM | POA: Diagnosis not present

## 2020-09-23 DIAGNOSIS — E876 Hypokalemia: Secondary | ICD-10-CM | POA: Diagnosis not present

## 2020-09-23 DIAGNOSIS — N2581 Secondary hyperparathyroidism of renal origin: Secondary | ICD-10-CM | POA: Diagnosis not present

## 2020-09-23 DIAGNOSIS — Z885 Allergy status to narcotic agent status: Secondary | ICD-10-CM

## 2020-09-23 DIAGNOSIS — E1165 Type 2 diabetes mellitus with hyperglycemia: Secondary | ICD-10-CM | POA: Diagnosis not present

## 2020-09-23 DIAGNOSIS — Z20822 Contact with and (suspected) exposure to covid-19: Secondary | ICD-10-CM | POA: Diagnosis present

## 2020-09-23 DIAGNOSIS — Z7901 Long term (current) use of anticoagulants: Secondary | ICD-10-CM

## 2020-09-23 DIAGNOSIS — D631 Anemia in chronic kidney disease: Secondary | ICD-10-CM | POA: Diagnosis not present

## 2020-09-23 DIAGNOSIS — I5030 Unspecified diastolic (congestive) heart failure: Secondary | ICD-10-CM | POA: Diagnosis not present

## 2020-09-23 DIAGNOSIS — M109 Gout, unspecified: Secondary | ICD-10-CM | POA: Diagnosis present

## 2020-09-23 DIAGNOSIS — K2950 Unspecified chronic gastritis without bleeding: Secondary | ICD-10-CM | POA: Diagnosis present

## 2020-09-23 DIAGNOSIS — E1142 Type 2 diabetes mellitus with diabetic polyneuropathy: Secondary | ICD-10-CM | POA: Diagnosis not present

## 2020-09-23 DIAGNOSIS — Z9049 Acquired absence of other specified parts of digestive tract: Secondary | ICD-10-CM

## 2020-09-23 DIAGNOSIS — N2889 Other specified disorders of kidney and ureter: Secondary | ICD-10-CM | POA: Diagnosis not present

## 2020-09-23 DIAGNOSIS — D509 Iron deficiency anemia, unspecified: Secondary | ICD-10-CM

## 2020-09-23 DIAGNOSIS — N281 Cyst of kidney, acquired: Secondary | ICD-10-CM | POA: Diagnosis not present

## 2020-09-23 DIAGNOSIS — Z170 Estrogen receptor positive status [ER+]: Secondary | ICD-10-CM | POA: Diagnosis not present

## 2020-09-23 DIAGNOSIS — I5032 Chronic diastolic (congestive) heart failure: Secondary | ICD-10-CM | POA: Diagnosis not present

## 2020-09-23 DIAGNOSIS — E1110 Type 2 diabetes mellitus with ketoacidosis without coma: Secondary | ICD-10-CM | POA: Diagnosis not present

## 2020-09-23 DIAGNOSIS — K449 Diaphragmatic hernia without obstruction or gangrene: Secondary | ICD-10-CM | POA: Diagnosis present

## 2020-09-23 DIAGNOSIS — E1122 Type 2 diabetes mellitus with diabetic chronic kidney disease: Secondary | ICD-10-CM | POA: Diagnosis not present

## 2020-09-23 DIAGNOSIS — E559 Vitamin D deficiency, unspecified: Secondary | ICD-10-CM

## 2020-09-23 DIAGNOSIS — I152 Hypertension secondary to endocrine disorders: Secondary | ICD-10-CM | POA: Diagnosis present

## 2020-09-23 DIAGNOSIS — I130 Hypertensive heart and chronic kidney disease with heart failure and stage 1 through stage 4 chronic kidney disease, or unspecified chronic kidney disease: Secondary | ICD-10-CM | POA: Diagnosis not present

## 2020-09-23 DIAGNOSIS — E1140 Type 2 diabetes mellitus with diabetic neuropathy, unspecified: Secondary | ICD-10-CM | POA: Diagnosis not present

## 2020-09-23 DIAGNOSIS — Z801 Family history of malignant neoplasm of trachea, bronchus and lung: Secondary | ICD-10-CM

## 2020-09-23 DIAGNOSIS — F32A Depression, unspecified: Secondary | ICD-10-CM | POA: Diagnosis not present

## 2020-09-23 DIAGNOSIS — Z832 Family history of diseases of the blood and blood-forming organs and certain disorders involving the immune mechanism: Secondary | ICD-10-CM

## 2020-09-23 DIAGNOSIS — Z853 Personal history of malignant neoplasm of breast: Secondary | ICD-10-CM

## 2020-09-23 DIAGNOSIS — Z833 Family history of diabetes mellitus: Secondary | ICD-10-CM

## 2020-09-23 DIAGNOSIS — N184 Chronic kidney disease, stage 4 (severe): Secondary | ICD-10-CM

## 2020-09-23 DIAGNOSIS — I82451 Acute embolism and thrombosis of right peroneal vein: Secondary | ICD-10-CM | POA: Diagnosis not present

## 2020-09-23 DIAGNOSIS — M1A3710 Chronic gout due to renal impairment, right ankle and foot, without tophus (tophi): Secondary | ICD-10-CM | POA: Diagnosis not present

## 2020-09-23 DIAGNOSIS — M329 Systemic lupus erythematosus, unspecified: Secondary | ICD-10-CM | POA: Diagnosis present

## 2020-09-23 DIAGNOSIS — Z635 Disruption of family by separation and divorce: Secondary | ICD-10-CM

## 2020-09-23 DIAGNOSIS — E1169 Type 2 diabetes mellitus with other specified complication: Secondary | ICD-10-CM | POA: Diagnosis present

## 2020-09-23 DIAGNOSIS — Z862 Personal history of diseases of the blood and blood-forming organs and certain disorders involving the immune mechanism: Secondary | ICD-10-CM | POA: Diagnosis present

## 2020-09-23 DIAGNOSIS — Z888 Allergy status to other drugs, medicaments and biological substances status: Secondary | ICD-10-CM

## 2020-09-23 DIAGNOSIS — I4510 Unspecified right bundle-branch block: Secondary | ICD-10-CM | POA: Diagnosis present

## 2020-09-23 DIAGNOSIS — N189 Chronic kidney disease, unspecified: Secondary | ICD-10-CM | POA: Diagnosis present

## 2020-09-23 DIAGNOSIS — E669 Obesity, unspecified: Secondary | ICD-10-CM | POA: Diagnosis present

## 2020-09-23 DIAGNOSIS — D649 Anemia, unspecified: Secondary | ICD-10-CM | POA: Diagnosis not present

## 2020-09-23 DIAGNOSIS — K7689 Other specified diseases of liver: Secondary | ICD-10-CM | POA: Diagnosis not present

## 2020-09-23 DIAGNOSIS — Z8673 Personal history of transient ischemic attack (TIA), and cerebral infarction without residual deficits: Secondary | ICD-10-CM

## 2020-09-23 DIAGNOSIS — C50311 Malignant neoplasm of lower-inner quadrant of right female breast: Secondary | ICD-10-CM | POA: Diagnosis not present

## 2020-09-23 DIAGNOSIS — D464 Refractory anemia, unspecified: Secondary | ICD-10-CM

## 2020-09-23 DIAGNOSIS — I10 Essential (primary) hypertension: Secondary | ICD-10-CM | POA: Diagnosis not present

## 2020-09-23 DIAGNOSIS — E538 Deficiency of other specified B group vitamins: Secondary | ICD-10-CM | POA: Insufficient documentation

## 2020-09-23 DIAGNOSIS — K319 Disease of stomach and duodenum, unspecified: Secondary | ICD-10-CM | POA: Diagnosis present

## 2020-09-23 DIAGNOSIS — Z923 Personal history of irradiation: Secondary | ICD-10-CM

## 2020-09-23 DIAGNOSIS — Z6826 Body mass index (BMI) 26.0-26.9, adult: Secondary | ICD-10-CM

## 2020-09-23 DIAGNOSIS — E440 Moderate protein-calorie malnutrition: Secondary | ICD-10-CM | POA: Insufficient documentation

## 2020-09-23 DIAGNOSIS — Z79899 Other long term (current) drug therapy: Secondary | ICD-10-CM

## 2020-09-23 DIAGNOSIS — Z794 Long term (current) use of insulin: Secondary | ICD-10-CM

## 2020-09-23 LAB — COMPREHENSIVE METABOLIC PANEL
ALT: 268 U/L — ABNORMAL HIGH (ref 0–44)
ALT: 290 U/L — ABNORMAL HIGH (ref 0–44)
AST: 325 U/L (ref 15–41)
AST: 410 U/L — ABNORMAL HIGH (ref 15–41)
Albumin: 3 g/dL — ABNORMAL LOW (ref 3.5–5.0)
Albumin: 3.1 g/dL — ABNORMAL LOW (ref 3.5–5.0)
Alkaline Phosphatase: 119 U/L (ref 38–126)
Alkaline Phosphatase: 107 U/L (ref 38–126)
Anion gap: 14 (ref 5–15)
Anion gap: 12 (ref 5–15)
BUN: 66 mg/dL — ABNORMAL HIGH (ref 8–23)
BUN: 66 mg/dL — ABNORMAL HIGH (ref 8–23)
CO2: 19 mmol/L — ABNORMAL LOW (ref 22–32)
CO2: 18 mmol/L — ABNORMAL LOW (ref 22–32)
Calcium: 10 mg/dL (ref 8.9–10.3)
Calcium: 10 mg/dL (ref 8.9–10.3)
Chloride: 108 mmol/L (ref 98–111)
Chloride: 106 mmol/L (ref 98–111)
Creatinine, Ser: 4.91 mg/dL (ref 0.44–1.00)
Creatinine, Ser: 4.81 mg/dL — ABNORMAL HIGH (ref 0.44–1.00)
GFR, Estimated: 9 mL/min — ABNORMAL LOW (ref >60)
GFR, Estimated: 9 mL/min — ABNORMAL LOW (ref >60)
Glucose, Bld: 150 mg/dL — ABNORMAL HIGH (ref 70–99)
Glucose, Bld: 180 mg/dL — ABNORMAL HIGH (ref 70–99)
Potassium: 4.5 mmol/L (ref 3.5–5.1)
Potassium: 4.6 mmol/L (ref 3.5–5.1)
Sodium: 141 mmol/L (ref 135–145)
Sodium: 136 mmol/L (ref 135–145)
Total Bilirubin: 0.4 mg/dL (ref 0.3–1.2)
Total Bilirubin: 0.8 mg/dL (ref 0.3–1.2)
Total Protein: 7.4 g/dL (ref 6.5–8.1)
Total Protein: 7.3 g/dL (ref 6.5–8.1)

## 2020-09-23 LAB — CBC WITH DIFFERENTIAL/PLATELET
Abs Immature Granulocytes: 0.23 10*3/uL — ABNORMAL HIGH (ref 0.00–0.07)
Basophils Absolute: 0 10*3/uL (ref 0.0–0.1)
Basophils Relative: 0 %
Eosinophils Absolute: 0 10*3/uL (ref 0.0–0.5)
Eosinophils Relative: 0 %
HCT: 36 % (ref 36.0–46.0)
Hemoglobin: 11.1 g/dL — ABNORMAL LOW (ref 12.0–15.0)
Immature Granulocytes: 2 %
Lymphocytes Relative: 16 %
Lymphs Abs: 1.8 10*3/uL (ref 0.7–4.0)
MCH: 26.6 pg (ref 26.0–34.0)
MCHC: 30.8 g/dL (ref 30.0–36.0)
MCV: 86.3 fL (ref 80.0–100.0)
Monocytes Absolute: 0.7 10*3/uL (ref 0.1–1.0)
Monocytes Relative: 6 %
Neutro Abs: 8.7 10*3/uL — ABNORMAL HIGH (ref 1.7–7.7)
Neutrophils Relative %: 76 %
Platelets: 316 10*3/uL (ref 150–400)
RBC: 4.17 MIL/uL (ref 3.87–5.11)
RDW: 14.6 % (ref 11.5–15.5)
WBC: 11.5 10*3/uL — ABNORMAL HIGH (ref 4.0–10.5)
nRBC: 0 % (ref 0.0–0.2)

## 2020-09-23 LAB — CBC WITH DIFFERENTIAL (CANCER CENTER ONLY)
Abs Immature Granulocytes: 0.17 10*3/uL — ABNORMAL HIGH (ref 0.00–0.07)
Basophils Absolute: 0 10*3/uL (ref 0.0–0.1)
Basophils Relative: 0 %
Eosinophils Absolute: 0 10*3/uL (ref 0.0–0.5)
Eosinophils Relative: 0 %
HCT: 33.9 % — ABNORMAL LOW (ref 36.0–46.0)
Hemoglobin: 10.8 g/dL — ABNORMAL LOW (ref 12.0–15.0)
Immature Granulocytes: 2 %
Lymphocytes Relative: 17 %
Lymphs Abs: 1.6 10*3/uL (ref 0.7–4.0)
MCH: 26.8 pg (ref 26.0–34.0)
MCHC: 31.9 g/dL (ref 30.0–36.0)
MCV: 84.1 fL (ref 80.0–100.0)
Monocytes Absolute: 0.5 10*3/uL (ref 0.1–1.0)
Monocytes Relative: 6 %
Neutro Abs: 6.7 10*3/uL (ref 1.7–7.7)
Neutrophils Relative %: 75 %
Platelet Count: 292 10*3/uL (ref 150–400)
RBC: 4.03 MIL/uL (ref 3.87–5.11)
RDW: 14.4 % (ref 11.5–15.5)
WBC Count: 9.1 10*3/uL (ref 4.0–10.5)
nRBC: 0 % (ref 0.0–0.2)

## 2020-09-23 LAB — PHOSPHORUS: Phosphorus: 1.5 mg/dL — ABNORMAL LOW (ref 2.5–4.6)

## 2020-09-23 LAB — MAGNESIUM: Magnesium: 1.4 mg/dL — ABNORMAL LOW (ref 1.7–2.4)

## 2020-09-23 MED ORDER — STERILE WATER FOR INJECTION IV SOLN
INTRAVENOUS | Status: DC
  Filled 2020-09-23 (×6): qty 1000

## 2020-09-23 MED ORDER — INSULIN ASPART 100 UNIT/ML IJ SOLN
0.0000 [IU] | INTRAMUSCULAR | Status: DC
  Administered 2020-09-24: 1 [IU] via SUBCUTANEOUS

## 2020-09-23 MED ORDER — SODIUM CHLORIDE 0.9 % IV BOLUS
1000.0000 mL | Freq: Once | INTRAVENOUS | Status: AC
  Administered 2020-09-23: 1000 mL via INTRAVENOUS

## 2020-09-23 MED ORDER — APIXABAN 5 MG PO TABS
5.0000 mg | ORAL_TABLET | Freq: Two times a day (BID) | ORAL | Status: DC
  Administered 2020-09-24 – 2020-09-27 (×8): 5 mg via ORAL
  Filled 2020-09-23 (×8): qty 1

## 2020-09-23 MED ORDER — LOPERAMIDE HCL 2 MG PO CAPS: 4.0000 mg | ORAL_CAPSULE | Freq: Four times a day (QID) | ORAL | Status: DC | PRN

## 2020-09-23 MED ORDER — NEBIVOLOL HCL 10 MG PO TABS
10.0000 mg | ORAL_TABLET | Freq: Every day | ORAL | Status: DC
  Administered 2020-09-24 – 2020-09-27 (×4): 10 mg via ORAL
  Filled 2020-09-23 (×4): qty 1

## 2020-09-23 MED ORDER — MELATONIN 5 MG PO TABS
10.0000 mg | ORAL_TABLET | Freq: Every day | ORAL | Status: DC
  Administered 2020-09-24 – 2020-09-26 (×4): 10 mg via ORAL
  Filled 2020-09-23 (×4): qty 2

## 2020-09-23 MED ORDER — FAMOTIDINE 20 MG PO TABS
40.0000 mg | ORAL_TABLET | Freq: Every day | ORAL | Status: DC
  Administered 2020-09-24 (×2): 40 mg via ORAL
  Filled 2020-09-23 (×2): qty 2

## 2020-09-23 MED ORDER — FLUOXETINE HCL 20 MG PO CAPS: 20.0000 mg | ORAL_CAPSULE | Freq: Every day | ORAL | Status: DC

## 2020-09-23 MED ORDER — PANCRELIPASE (LIP-PROT-AMYL) 36000-114000 UNITS PO CPEP
36000.0000 [IU] | ORAL_CAPSULE | Freq: Three times a day (TID) | ORAL | Status: DC
  Administered 2020-09-24 – 2020-09-27 (×10): 36000 [IU] via ORAL
  Filled 2020-09-23 (×10): qty 1

## 2020-09-23 MED ORDER — HYDRALAZINE HCL 25 MG PO TABS
100.0000 mg | ORAL_TABLET | Freq: Two times a day (BID) | ORAL | Status: DC
  Administered 2020-09-24 – 2020-09-27 (×8): 100 mg via ORAL
  Filled 2020-09-23 (×8): qty 4

## 2020-09-23 MED ORDER — AMLODIPINE BESYLATE 10 MG PO TABS
10.0000 mg | ORAL_TABLET | Freq: Every day | ORAL | Status: DC
  Administered 2020-09-24 – 2020-09-27 (×4): 10 mg via ORAL
  Filled 2020-09-23 (×4): qty 1

## 2020-09-23 MED ORDER — ACETAMINOPHEN 650 MG RE SUPP: 650.0000 mg | Freq: Four times a day (QID) | RECTAL | Status: DC | PRN

## 2020-09-23 MED ORDER — BUDESONIDE 3 MG PO CPEP
3.0000 mg | ORAL_CAPSULE | Freq: Every day | ORAL | Status: DC
  Administered 2020-09-24 – 2020-09-27 (×4): 3 mg via ORAL
  Filled 2020-09-23 (×4): qty 1

## 2020-09-23 MED ORDER — ONDANSETRON HCL 4 MG PO TABS: 4.0000 mg | ORAL_TABLET | Freq: Four times a day (QID) | ORAL | Status: DC | PRN

## 2020-09-23 MED ORDER — ONDANSETRON HCL 4 MG/2ML IJ SOLN: 4.0000 mg | Freq: Four times a day (QID) | INTRAMUSCULAR | Status: DC | PRN

## 2020-09-23 MED ORDER — EPOETIN ALFA-EPBX 40000 UNIT/ML IJ SOLN
INTRAMUSCULAR | Status: AC
  Filled 2020-09-23: qty 1

## 2020-09-23 MED ORDER — EPOETIN ALFA-EPBX 40000 UNIT/ML IJ SOLN
40000.0000 [IU] | Freq: Once | INTRAMUSCULAR | Status: AC
  Administered 2020-09-23: 40000 [IU] via SUBCUTANEOUS

## 2020-09-23 MED ORDER — ISOSORBIDE MONONITRATE ER 30 MG PO TB24
15.0000 mg | ORAL_TABLET | Freq: Every day | ORAL | Status: DC
  Administered 2020-09-24 – 2020-09-27 (×4): 15 mg via ORAL
  Filled 2020-09-23 (×4): qty 1

## 2020-09-23 MED ORDER — CYPROHEPTADINE HCL 4 MG PO TABS
4.0000 mg | ORAL_TABLET | Freq: Two times a day (BID) | ORAL | Status: DC
  Filled 2020-09-23: qty 1

## 2020-09-23 MED ORDER — PANTOPRAZOLE SODIUM 40 MG PO TBEC
40.0000 mg | DELAYED_RELEASE_TABLET | Freq: Every day | ORAL | Status: DC
  Administered 2020-09-24 – 2020-09-27 (×4): 40 mg via ORAL
  Filled 2020-09-23 (×4): qty 1

## 2020-09-23 MED ORDER — ROSUVASTATIN CALCIUM 5 MG PO TABS
5.0000 mg | ORAL_TABLET | Freq: Every day | ORAL | Status: DC
  Administered 2020-09-24: 5 mg via ORAL
  Filled 2020-09-23: qty 1

## 2020-09-23 MED ORDER — MAGNESIUM SULFATE 2 GM/50ML IV SOLN
2.0000 g | Freq: Once | INTRAVENOUS | Status: AC
  Administered 2020-09-23: 2 g via INTRAVENOUS
  Filled 2020-09-23: qty 50

## 2020-09-23 MED ORDER — FLUOXETINE HCL 20 MG PO CAPS
20.0000 mg | ORAL_CAPSULE | Freq: Every day | ORAL | Status: DC
  Administered 2020-09-24 – 2020-09-26 (×4): 20 mg via ORAL
  Filled 2020-09-23 (×4): qty 1

## 2020-09-23 MED ORDER — PREGABALIN 25 MG PO CAPS
25.0000 mg | ORAL_CAPSULE | Freq: Two times a day (BID) | ORAL | Status: DC
  Administered 2020-09-24 – 2020-09-27 (×8): 25 mg via ORAL
  Filled 2020-09-23 (×7): qty 1

## 2020-09-23 MED ORDER — ACETAMINOPHEN 325 MG PO TABS: 650.0000 mg | ORAL_TABLET | Freq: Four times a day (QID) | ORAL | Status: DC | PRN

## 2020-09-23 MED ORDER — SUCRALFATE 1 G PO TABS
1.0000 g | ORAL_TABLET | Freq: Three times a day (TID) | ORAL | Status: DC
  Administered 2020-09-24 – 2020-09-27 (×14): 1 g via ORAL
  Filled 2020-09-23 (×14): qty 1

## 2020-09-23 NOTE — Patient Instructions (Signed)
Epoetin Alfa injection What is this medication? EPOETIN ALFA (e POE e tin AL fa) helps your body make more red blood cells. This medicine is used to treat anemia caused by chronic kidney disease, cancer chemotherapy, or HIV-therapy. It may also be used before surgery if you have anemia. This medicine may be used for other purposes; ask your health care provider or pharmacist if you have questions. COMMON BRAND NAME(S): Epogen, Procrit, Retacrit What should I tell my care team before I take this medication? They need to know if you have any of these conditions: cancer heart disease high blood pressure history of blood clots history of stroke low levels of folate, iron, or vitamin B12 in the blood seizures an unusual or allergic reaction to erythropoietin, albumin, benzyl alcohol, hamster proteins, other medicines, foods, dyes, or preservatives pregnant or trying to get pregnant breast-feeding How should I use this medication? This medicine is for injection into a vein or under the skin. It is usually given by a health care professional in a hospital or clinic setting. If you get this medicine at home, you will be taught how to prepare and give this medicine. Use exactly as directed. Take your medicine at regular intervals. Do not take your medicine more often than directed. It is important that you put your used needles and syringes in a special sharps container. Do not put them in a trash can. If you do not have a sharps container, call your pharmacist or healthcare provider to get one. A special MedGuide will be given to you by the pharmacist with each prescription and refill. Be sure to read this information carefully each time. Talk to your pediatrician regarding the use of this medicine in children. While this drug may be prescribed for selected conditions, precautions do apply. Overdosage: If you think you have taken too much of this medicine contact a poison control center or emergency  room at once. NOTE: This medicine is only for you. Do not share this medicine with others. What if I miss a dose? If you miss a dose, take it as soon as you can. If it is almost time for your next dose, take only that dose. Do not take double or extra doses. What may interact with this medication? Interactions have not been studied. This list may not describe all possible interactions. Give your health care provider a list of all the medicines, herbs, non-prescription drugs, or dietary supplements you use. Also tell them if you smoke, drink alcohol, or use illegal drugs. Some items may interact with your medicine. What should I watch for while using this medication? Your condition will be monitored carefully while you are receiving this medicine. You may need blood work done while you are taking this medicine. This medicine may cause a decrease in vitamin B6. You should make sure that you get enough vitamin B6 while you are taking this medicine. Discuss the foods you eat and the vitamins you take with your health care professional. What side effects may I notice from receiving this medication? Side effects that you should report to your doctor or health care professional as soon as possible: allergic reactions like skin rash, itching or hives, swelling of the face, lips, or tongue seizures signs and symptoms of a blood clot such as breathing problems; changes in vision; chest pain; severe, sudden headache; pain, swelling, warmth in the leg; trouble speaking; sudden numbness or weakness of the face, arm or leg signs and symptoms of a stroke like   changes in vision; confusion; trouble speaking or understanding; severe headaches; sudden numbness or weakness of the face, arm or leg; trouble walking; dizziness; loss of balance or coordination Side effects that usually do not require medical attention (report to your doctor or health care professional if they continue or are  bothersome): chills cough dizziness fever headaches joint pain muscle cramps muscle pain nausea, vomiting pain, redness, or irritation at site where injected This list may not describe all possible side effects. Call your doctor for medical advice about side effects. You may report side effects to FDA at 1-800-FDA-1088. Where should I keep my medication? Keep out of the reach of children. Store in a refrigerator between 2 and 8 degrees C (36 and 46 degrees F). Do not freeze or shake. Throw away any unused portion if using a single-dose vial. Multi-dose vials can be kept in the refrigerator for up to 21 days after the initial dose. Throw away unused medicine. NOTE: This sheet is a summary. It may not cover all possible information. If you have questions about this medicine, talk to your doctor, pharmacist, or health care provider.  2022 Elsevier/Gold Standard (2016-10-12 08:35:19)  

## 2020-09-23 NOTE — ED Provider Notes (Signed)
Emergency Medicine Provider Triage Evaluation Note  [name] , a 75 y.o. female  was evaluated in triage.  Pt complains of abnormal lab.  Patient has had a history of oscillating kidney function.  Seems that she will have an AKI which will prompt her to present to the ER/hospital and experience improvement, be discharged home and then have a similar episode.  She has had this happen several times.  Per daughter patient had elevated creatinine at her PCP office today.  Patient states she feels well.  Denies any urinary symptoms and states that she is still peeing normally.  Denies any abdominal pain fatigue fevers or chills.  Review of Systems  Positive: Abnormal lab Negative: fever  Physical Exam  BP (!) 111/59   Pulse 81   Resp 16   SpO2 100%  Gen:   Awake, no distress   Resp:  Normal effort  MSK:   Moves extremities without difficulty  Other:    Medical Decision Making  Medically screening exam initiated at 6:32 PM.  Appropriate orders placed.  [name] was informed that the remainder of the evaluation will be completed by another provider, this initial triage assessment does not replace that evaluation, and the importance of remaining in the ED until their evaluation is complete.  Patient is well-appearing 75 year old female with abnormal kidney labs.  We will repeat today although I reviewed EMR it appears that they were drawn earlier today and her last creatinine was just below 5.   [name], [place] 09/23/20 [name]    Katie Chapel, MD 09/23/20 [id number]

## 2020-09-23 NOTE — H&P (Signed)
History and Physical    [name] QQI:[id number] DOB: Aug 11, 1945 DOA: 09/23/2020  PCP: Seward Carol, MD  Patient coming from: Home  I have personally briefly reviewed patient's old medical records in [place]  Chief Complaint: Abn lab  HPI: [name] is a 75 y.o. female with medical history significant of DM2, HTN, breast CA in remission, CKD 3-4, dCHF.  Pt with chronic N/V/D.  Pt recently admitted to hospital 6/18-6/25 for AKF on CKD, ongoing N/V.  AKF was believed to be due to dehydration from N/V + torsemide and slowly improved over course of hospitalization by holding torsemide and IV hydration.  EGD performed during that hospital stay showed bile reflux gastritis.  Pt started on Carafate.  Creat had improved to 1.58 as of 6/25.  Pt seen by Dr. Krista Blue today for retacrit injection and lab follow up.  Lab work today showed creat back up to 4.9 and also showed a new transaminitis which she hasnt had in the past.  Pt sent in to ED.  No abd pain, no fever, no chills.  No NSAID use.   ED Course: Creat 4.8, BUN 66, AST 410 ALT 290.   Review of Systems: As per HPI, otherwise all review of systems negative.  Past Medical History:  Diagnosis Date   Abdominal discomfort    Chronic N/V/D. Presumptive dx Crohn's dx per elevated p ANCA. Failed Entocort and Pentasa. Sep 2003 - ileocolectomy c anastomosis per Dr Deon Pilling 2/2 adhesions - path was hegative for Crohns. EGD, Sm bowel follow through (11/03), and an eteroclysis (10/03) were unrevealing. Cuases hypomag and hypocalcemia.   Adnexal mass 8/03   s/p lap BSO (R ovarian fibroma) & lysis of adhesions   Allergy    Seasonal   Anemia    Multifactorial. Baseline HgB 10-11 ish. B12 def - 150 in 3/10. Fe Def - ferritin 35 3/10. Both are being repleted.   Breast cancer ([place]) 03/16/13   right, 5 o'clock   CAD (coronary artery disease) 1996   1996 - PTCA and angioplasty diagonal branch. 2000 - Rotoblator & angiopllasty  of diagonal. 2006 - subendocardial AMI, DES to proximal LAD.Marland Kitchen Also had 90% stenosis in distal apical LAD. EF 55 with apical hypokinesis. Indefinite ASA and Plavix.   CHF (congestive heart failure) (HCC)    Chronic kidney disease    Chronic renal insuff baseline Cr 1.2 - 1.4 ish.   Chronic pain    CT 10/10 = Spinal stenosis L2 - S1.   Diabetes mellitus    Insulin dependent   Gout    Hx of radiation therapy 06/02/13- 07/16/13   right rbeast 4500 cGy 25 sessions, right breast boost 1600 cGy in 8 sessions   Hyperlipidemia    Managed with both a statin and Welchol. Welchol stopped 2014 2/2 cost and started on fenofibrate    Hypertension    2006 B renal arteries patent. 2003 MRA - no RAS. 2003 pheo W/U Dr Hassell Done reportedly negative.   Hypoxia 07/23/2017   Lupus ([place])    Lymphedema of breast    Personal history of radiation therapy 2015   RBBB    Renal vein thrombosis (HCC)    SBO (small bowel obstruction) ([place]) 09/17/2017   Secondary hyperparathyroidism ([place])    Stroke [facility])    Incidental finding MRI 2002 L lacunar infarct   Vitamin B12 deficiency    Vitamin D deficiency    Wears dentures    top    Past Surgical History:  Procedure Laterality Date   ABDOMINAL HYSTERECTOMY     BILATERAL SALPINGOOPHORECTOMY  8/03   Lap BSO (R ovarian fibroma) and adhesion lysis   BIOPSY  09/08/2020   Procedure: BIOPSY;  Surgeon: Ronald Lobo, MD;  Location: [facility];  Service: Endoscopy;;   BOWEL RESECTION  2003   ileocolectomy with anastomosis 2/2 adhesions   BREAST BIOPSY Right 2015   BREAST BIOPSY Right 07/2014   BREAST LUMPECTOMY Right 04/22/2013   BREAST LUMPECTOMY WITH NEEDLE LOCALIZATION AND AXILLARY SENTINEL LYMPH NODE BX Right 04/22/2013   Procedure: BREAST LUMPECTOMY WITH NEEDLE LOCALIZATION AND AXILLARY SENTINEL LYMPH NODE BX;  Surgeon: Stark Klein, MD;  Location: [place];  Service: General;  Laterality: Right;   CARDIAC CATHETERIZATION     2 stents    CHOLECYSTECTOMY     COLONOSCOPY     ESOPHAGOGASTRODUODENOSCOPY N/A 09/08/2020   Procedure: ESOPHAGOGASTRODUODENOSCOPY (EGD);  Surgeon: Ronald Lobo, MD;  Location: [facility];  Service: Endoscopy;  Laterality: N/A;   HEMICOLECTOMY     R sided hemicolectomy   HERNIA REPAIR     Ventral hernia repair   PTCA  4/06     reports that she has never smoked. She has never used smokeless tobacco. She reports that she does not drink alcohol and does not use drugs.  Allergies  Allergen Reactions   Percocet [Oxycodone-Acetaminophen] Other (See Comments)    headaches   Diazepam Other (See Comments)    REACTION: Agitation Other reaction(s): agitation   Haloperidol Lactate Nausea And Vomiting   Lorazepam Nausea Only    Other reaction(s): nausea   Morphine Sulfate Other (See Comments)    REACTION: Agitation   Propoxyphene Hcl Nausea And Vomiting   Tramadol Hcl Nausea And Vomiting   Anesthetics, Halogenated     Per patient's daughter, she can not tolerate anesthetics.    Etomidate     Heart stopped during anesthesia   Fentanyl     Heart stopped during anesthesia    Haloperidol     Other reaction(s): nausea & vomitin   Morphine Sulfate     Other reaction(s): nausea   Oxycodone-Acetaminophen     Other reaction(s): headaches   Propoxyphene     Other reaction(s): nausea & vomiting   Tramadol Hcl     Other reaction(s): nausea & vomitin Other reaction(s): nausea & vomitin   Versed [Midazolam]     Heart stopped during anesthesia    Family History  Problem Relation Age of Onset   Pancreatic cancer Brother 79   Breast cancer Mother 51   Lung cancer Maternal Aunt    Breast cancer Maternal Aunt    Prostate cancer Maternal Uncle    Heart attack Maternal Grandmother    Breast cancer Maternal Grandmother    Colon cancer Maternal Grandmother    Clotting disorder Maternal Grandmother    Diabetes Maternal Grandmother    Kidney failure Maternal Aunt    Hypertension Daughter       Prior to Admission medications   Medication Sig Start Date End Date Taking? Authorizing Provider  acetaminophen (TYLENOL) 500 MG tablet Take 1,000 mg by mouth every 6 (six) hours as needed for moderate pain or headache.   Yes [provider]  allopurinol (ZYLOPRIM) 100 MG tablet Take 0.5 tablets (50 mg total) by mouth every other day. 07/01/20  Yes Mercy Riding, MD  amLODipine (NORVASC) 10 MG tablet Take 1 tablet (10 mg total) by mouth daily. 08/22/17  Yes Angiulli, Lavon Paganini, PA-C  apixaban Arne [place])  5 MG TABS tablet Take 1 tablet (5 mg total) by mouth 2 (two) times daily. Start after you finish the starter pack 07/24/20  Yes Vann, Jessica U, DO  budesonide (ENTOCORT EC) 3 MG 24 hr capsule Take 1 capsule by mouth once daily Patient taking differently: Take 3 mg by mouth daily. 04/06/20  Yes Gatha Mayer, MD  Cyanocobalamin (B-12 COMPLIANCE INJECTION IJ) Inject 1 vial as directed every 30 (thirty) days.   Yes [provider]  cyproheptadine (PERIACTIN) 4 MG tablet Take 4 mg by mouth 2 (two) times daily.   Yes [provider]  Epoetin Alfa-epbx (RETACRIT IJ) Inject 1 vial as directed every 30 (thirty) days.   Yes [provider]  ergocalciferol (VITAMIN D2) 1.25 MG (50000 UT) capsule Take 1 capsule (50,000 Units total) by mouth once a week. Patient taking differently: Take 50,000 Units by mouth every Monday. 08/02/20  Yes Truitt Merle, MD  famotidine (PEPCID) 40 MG tablet Take 40 mg by mouth at bedtime. 08/23/20  Yes [provider]  FLUoxetine (PROZAC) 20 MG capsule Take 20 mg by mouth at bedtime.   Yes [provider]  hydrALAZINE (APRESOLINE) 100 MG tablet TAKE 1 TABLET BY MOUTH 3  TIMES DAILY Patient taking differently: Take 100 mg by mouth 2 (two) times daily. 01/20/20  Yes Josue Hector, MD  isosorbide mononitrate (IMDUR) 30 MG 24 hr tablet TAKE 1/2 (ONE-HALF) TABLET BY MOUTH ONCE DAILY (PLEASE  SCHEDULE  AN  APPOINTMENT  FOR  FURTHER   REFILLS) Patient taking differently: Take 15 mg by mouth daily. 07/12/20  Yes Isaiah Serge, NP  linagliptin (TRADJENTA) 5 MG TABS tablet Take 5 mg by mouth daily.   Yes [provider]  lipase/protease/amylase (CREON) 36000 UNITS CPEP capsule Take 36,000 Units by mouth 3 (three) times daily with meals.   Yes [provider]  loperamide (IMODIUM A-D) 2 MG tablet Take 4 mg by mouth 4 (four) times daily as needed for diarrhea or loose stools.   Yes [provider]  Melatonin 5 MG TABS Take 10 mg by mouth at bedtime.   Yes [provider]  nebivolol (BYSTOLIC) 10 MG tablet Take 1 tablet (10 mg total) by mouth daily. 06/30/20  Yes Mercy Riding, MD  nitroGLYCERIN (NITROSTAT) 0.4 MG SL tablet DISSOLVE 1 TABLET UNDER THE TONGUE EVERY 5 MINUTES AS  NEEDED FOR CHEST PAIN. MAX  OF 3 TABLETS IN 15 MINUTES. CALL 911 IF PAIN PERSISTS. Patient taking differently: Place 0.4 mg under the tongue every 5 (five) minutes as needed for chest pain. 10/28/19  Yes Josue Hector, MD  ondansetron (ZOFRAN ODT) 4 MG disintegrating tablet Take 1 tablet (4 mg total) by mouth every 8 (eight) hours as needed for nausea or vomiting. 08/17/20  Yes Walisiewicz, Kaitlyn E, PA-C  pantoprazole (PROTONIX) 40 MG tablet Take 1 tablet (40 mg total) by mouth daily. 09/11/20 01/09/21 Yes Oswald Hillock, MD  rosuvastatin (CRESTOR) 5 MG tablet Take 5 mg by mouth daily.   Yes [provider]  sucralfate (CARAFATE) 1 g tablet Take 1 g by mouth 4 (four) times daily -  with meals and at bedtime.   Yes [provider]  torsemide (DEMADEX) 20 MG tablet Take 1 tablet (20 mg total) by mouth daily. 09/10/20  Yes Oswald Hillock, MD  pregabalin (LYRICA) 25 MG capsule Take 1 capsule (25 mg total) by mouth 2 (two) times daily. Patient not taking: No sig reported 07/19/20  Kirsteins, Luanna Salk, MD  sucralfate (CARAFATE) 1 GM/10ML suspension Take 10 mLs (1 g total) by mouth 4 (four) times daily -  with meals and  at bedtime. Patient not taking: No sig reported 09/10/20 10/10/20  Oswald Hillock, MD    Physical Exam: Vitals:   09/23/20 1815 09/23/20 2003 09/23/20 2100 09/23/20 2200  BP: (!) 111/59 131/85 128/64 129/63  Pulse: 81 79 74 77  Resp: 16 15 16 14   SpO2: 100% 100% 100% 99%    Constitutional: NAD, calm, comfortable Eyes: PERRL, lids and conjunctivae normal ENMT: Mucous membranes are moist. Posterior pharynx clear of any exudate or lesions.Normal dentition.  Neck: normal, supple, no masses, no thyromegaly Respiratory: clear to auscultation bilaterally, no wheezing, no crackles. Normal respiratory effort. No accessory muscle use.  Cardiovascular: Regular rate and rhythm, no murmurs / rubs / gallops. No extremity edema. 2+ pedal pulses. No carotid bruits.  Abdomen: no tenderness, no masses palpated. No hepatosplenomegaly. Bowel sounds positive.  Musculoskeletal: no clubbing / cyanosis. No joint deformity upper and lower extremities. Good ROM, no contractures. Normal muscle tone.  Skin: no rashes, lesions, ulcers. No induration Neurologic: CN 2-12 grossly intact. Sensation intact, DTR normal. Strength 5/5 in all 4.  Psychiatric: Normal judgment and insight. Alert and oriented x 3. Normal mood.    Labs on Admission: I have personally reviewed following labs and imaging studies  CBC: Recent Labs  Lab 09/23/20 1525 09/23/20 1839  WBC 9.1 11.5*  NEUTROABS 6.7 8.7*  HGB 10.8* 11.1*  HCT 33.9* 36.0  MCV 84.1 86.3  PLT 292 992   Basic Metabolic Panel: Recent Labs  Lab 09/23/20 1525 09/23/20 1839  NA 141 136  K 4.5 4.6  CL 108 106  CO2 19* 18*  GLUCOSE 150* 180*  BUN 66* 66*  CREATININE 4.91* 4.81*  CALCIUM 10.0 10.0  MG  --  1.4*  PHOS  --  1.5*   GFR: CrCl cannot be calculated (Unknown ideal weight.). Liver Function Tests: Recent Labs  Lab 09/23/20 1525 09/23/20 1839  AST 325* 410*  ALT 268* 290*  ALKPHOS 119 107  BILITOT 0.4 0.8  PROT 7.4 7.3  ALBUMIN 3.0* 3.1*    No results for input(s): LIPASE, AMYLASE in the last 168 hours. No results for input(s): AMMONIA in the last 168 hours. Coagulation Profile: No results for input(s): INR, PROTIME in the last 168 hours. Cardiac Enzymes: No results for input(s): CKTOTAL, CKMB, CKMBINDEX, TROPONINI in the last 168 hours. BNP (last 3 results) No results for input(s): PROBNP in the last 8760 hours. HbA1C: No results for input(s): HGBA1C in the last 72 hours. CBG: No results for input(s): GLUCAP in the last 168 hours. Lipid Profile: No results for input(s): CHOL, HDL, LDLCALC, TRIG, CHOLHDL, LDLDIRECT in the last 72 hours. Thyroid Function Tests: No results for input(s): TSH, T4TOTAL, FREET4, T3FREE, THYROIDAB in the last 72 hours. Anemia Panel: No results for input(s): VITAMINB12, FOLATE, FERRITIN, TIBC, IRON, RETICCTPCT in the last 72 hours. Urine analysis:    Component Value Date/Time   COLORURINE YELLOW 09/03/2020 1919   APPEARANCEUR HAZY (A) 09/03/2020 1919   APPEARANCEUR Clear 08/22/2018 1620   LABSPEC 1.016 09/03/2020 1919   LABSPEC 1.020 03/03/2014 1341   PHURINE 5.5 09/03/2020 1919   GLUCOSEU NEGATIVE 09/03/2020 1919   GLUCOSEU Negative 03/03/2014 1341   HGBUR NEGATIVE 09/03/2020 [place] NEGATIVE 09/03/2020 1919   BILIRUBINUR Negative 08/22/2018 1620   BILIRUBINUR Negative 03/03/2014 1341   [place] 09/03/2020 1919  PROTEINUR 100 (A) 09/03/2020 1919   UROBILINOGEN 0.2 03/03/2014 1341   NITRITE NEGATIVE 09/03/2020 1919   LEUKOCYTESUR SMALL (A) 09/03/2020 1919   LEUKOCYTESUR Negative 03/03/2014 1341    Radiological Exams on Admission: No results found.  EKG: Independently reviewed.  Assessment/Plan Principal Problem:   Acute renal failure superimposed on stage 4 chronic kidney disease (HCC) Active Problems:   Breast cancer of lower-inner quadrant of right female breast ([place])   Diabetic neuropathy associated with type 2 diabetes mellitus (HCC)   Essential  hypertension   Chronic diastolic CHF (congestive heart failure) (HCC)   Nausea and vomiting   Transaminitis    AKF on CKD 3-4 Possibly dehydration from torsemide again as well as N/V?  This is what we think caused it last month (renal fxn improved last month with holding diuretics and IV hydration). Therefore will once again try holding diuretics and hydrating with IVF: 1L NS bolus in ED then Isotonic bicarb at 100 cc/hr overnight Repeat BMP in AM, may need to switch to saline in AM Strict intake and output Repeat BMP in AM Renal US Nephro consult in AM N/V - Actually slightly improved recently per daughter EGD and GI work up last admit showed bile reflux gastritis Transaminitis - New issue for patient, didn't have this last admit. RUQ [place] pending Pt s/p cholecystectomy already Medication induced? Pt recently started on Creon, Prozac, and Periactin newly this past week. Cont creon and Prozac Hold periactin dCHF - Holding diuretics Continue BB, imdur HTN - Cont BB, imdur, norvasc DM2 - Hold home PO hypoglycemics Sensitive SSI Q4H Breast CA - Believed in remission Did have breast mass that was suspicious in April this year: Biopsy was neg for malignancy Got referred for skin biopsy by Dr. Krista Blue to make sure though.  DVT prophylaxis: Eliquis (On for prior DVT) Code Status: Full Family Communication: Daughter at bedside Disposition Plan: Home after renal recovery Consults called: None Admission status: Admit to inpatient  Severity of Illness: The appropriate patient status for this patient is INPATIENT. Inpatient status is judged to be reasonable and necessary in order to provide the required intensity of service to ensure the patient's safety. The patient's presenting symptoms, physical exam findings, and initial radiographic and laboratory data in the context of their chronic comorbidities is felt to place them at high risk for further clinical deterioration. Furthermore,  it is not anticipated that the patient will be medically stable for discharge from the hospital within 2 midnights of admission. The following factors support the patient status of inpatient.   IP status due to AKF with creat of 4.8 up from 1.5 on 2/35   * I certify that at the point of admission it is my clinical judgment that the patient will require inpatient hospital care spanning beyond 2 midnights from the point of admission due to high intensity of service, high risk for further deterioration and high frequency of surveillance required.*   Raea Magallon M. DO [facility]  How to contact the [facility] Attending or Consulting provider [place] or covering provider during after hours [place], for this patient?  Check the care team in [facility] and look for a) attending/consulting [facility] provider listed and b) the [facility] team listed Log into [email or web address]  Amion Physician Scheduling and messaging for groups and whole hospitals  On call and physician scheduling software for group practices, residents, hospitalists and other medical providers for call, clinic, rotation and shift schedules. OnCall [employer] is a hospital-wide system for  scheduling doctors and paging doctors on call. EasyPlot is for scientific plotting and data analysis.  [email or web address]  and use [place]'s universal password to access. If you do not have the password, please contact the hospital operator.  Locate the [facility] provider you are looking for under [facility] and page to a number that you can be directly reached. If you still have difficulty reaching the provider, please page the [facility] (Director on Call) for the Hospitalists listed on amion for assistance.  09/23/2020, 11:44 PM

## 2020-09-23 NOTE — ED Notes (Signed)
Family reports that pt received a call from PCP about elevated creatine level and lipase. Pt denies any pain. Pt has stage 4 kidney disease per daughter. Pt does still produce urine without complications.

## 2020-09-23 NOTE — Telephone Encounter (Signed)
Patient came in today for lab and Retacrit injection, she left after the injection.  I got a call for critical lab results with creatinine 4.9, and AST 325.  Her prior creatinine was 1.58 on September 10, 2020.  I called patient, no answer, but did get a hold of her daughter Ms. Hills.  She states her mother has not been doing well in the past months with recurrent nausea, vomiting, fatigue and weakness.  She was hospitalized for that on June 18.  I recommend patient go to the emergency room for acute on chronic renal failure.  She agrees.   Truitt Merle  09/23/2020 4:47 PM

## 2020-09-24 ENCOUNTER — Encounter (HOSPITAL_COMMUNITY): Payer: Self-pay | Admitting: Internal Medicine

## 2020-09-24 DIAGNOSIS — N179 Acute kidney failure, unspecified: Secondary | ICD-10-CM | POA: Diagnosis not present

## 2020-09-24 DIAGNOSIS — I10 Essential (primary) hypertension: Secondary | ICD-10-CM | POA: Diagnosis not present

## 2020-09-24 DIAGNOSIS — I5032 Chronic diastolic (congestive) heart failure: Secondary | ICD-10-CM | POA: Diagnosis not present

## 2020-09-24 DIAGNOSIS — E1142 Type 2 diabetes mellitus with diabetic polyneuropathy: Secondary | ICD-10-CM | POA: Diagnosis not present

## 2020-09-24 LAB — CBC
HCT: 31.5 % — ABNORMAL LOW (ref 36.0–46.0)
Hemoglobin: 10.1 g/dL — ABNORMAL LOW (ref 12.0–15.0)
MCH: 27.2 pg (ref 26.0–34.0)
MCHC: 32.1 g/dL (ref 30.0–36.0)
MCV: 84.7 fL (ref 80.0–100.0)
Platelets: 239 10*3/uL (ref 150–400)
RBC: 3.72 MIL/uL — ABNORMAL LOW (ref 3.87–5.11)
RDW: 14.4 % (ref 11.5–15.5)
WBC: 8.3 10*3/uL (ref 4.0–10.5)
nRBC: 0 % (ref 0.0–0.2)

## 2020-09-24 LAB — COMPREHENSIVE METABOLIC PANEL
ALT: 234 U/L — ABNORMAL HIGH (ref 0–44)
ALT: 207 U/L — ABNORMAL HIGH (ref 0–44)
AST: 255 U/L — ABNORMAL HIGH (ref 15–41)
AST: 187 U/L — ABNORMAL HIGH (ref 15–41)
Albumin: 2.7 g/dL — ABNORMAL LOW (ref 3.5–5.0)
Albumin: 2.5 g/dL — ABNORMAL LOW (ref 3.5–5.0)
Alkaline Phosphatase: 93 U/L (ref 38–126)
Alkaline Phosphatase: 92 U/L (ref 38–126)
Anion gap: 8 (ref 5–15)
Anion gap: 8 (ref 5–15)
BUN: 57 mg/dL — ABNORMAL HIGH (ref 8–23)
BUN: 57 mg/dL — ABNORMAL HIGH (ref 8–23)
CO2: 21 mmol/L — ABNORMAL LOW (ref 22–32)
CO2: 23 mmol/L (ref 22–32)
Calcium: 9.2 mg/dL (ref 8.9–10.3)
Calcium: 8.9 mg/dL (ref 8.9–10.3)
Chloride: 108 mmol/L (ref 98–111)
Chloride: 107 mmol/L (ref 98–111)
Creatinine, Ser: 3.93 mg/dL — ABNORMAL HIGH (ref 0.44–1.00)
Creatinine, Ser: 3.46 mg/dL — ABNORMAL HIGH (ref 0.44–1.00)
GFR, Estimated: 12 mL/min — ABNORMAL LOW (ref >60)
GFR, Estimated: 13 mL/min — ABNORMAL LOW (ref >60)
Glucose, Bld: 95 mg/dL (ref 70–99)
Glucose, Bld: 110 mg/dL — ABNORMAL HIGH (ref 70–99)
Potassium: 3.6 mmol/L (ref 3.5–5.1)
Potassium: 3.1 mmol/L — ABNORMAL LOW (ref 3.5–5.1)
Sodium: 137 mmol/L (ref 135–145)
Sodium: 138 mmol/L (ref 135–145)
Total Bilirubin: 0.6 mg/dL (ref 0.3–1.2)
Total Bilirubin: 0.7 mg/dL (ref 0.3–1.2)
Total Protein: 6.2 g/dL — ABNORMAL LOW (ref 6.5–8.1)
Total Protein: 5.8 g/dL — ABNORMAL LOW (ref 6.5–8.1)

## 2020-09-24 LAB — CBG MONITORING, ED
Glucose-Capillary: 127 mg/dL — ABNORMAL HIGH (ref 70–99)
Glucose-Capillary: 95 mg/dL (ref 70–99)
Glucose-Capillary: 93 mg/dL (ref 70–99)

## 2020-09-24 LAB — GLUCOSE, CAPILLARY
Glucose-Capillary: 106 mg/dL — ABNORMAL HIGH (ref 70–99)
Glucose-Capillary: 114 mg/dL — ABNORMAL HIGH (ref 70–99)
Glucose-Capillary: 143 mg/dL — ABNORMAL HIGH (ref 70–99)
Glucose-Capillary: 158 mg/dL — ABNORMAL HIGH (ref 70–99)

## 2020-09-24 LAB — SARS CORONAVIRUS 2 (TAT 6-24 HRS): SARS Coronavirus 2: NEGATIVE

## 2020-09-24 MED ORDER — INSULIN ASPART 100 UNIT/ML IJ SOLN
0.0000 [IU] | Freq: Three times a day (TID) | INTRAMUSCULAR | Status: DC
  Administered 2020-09-25: 2 [IU] via SUBCUTANEOUS
  Administered 2020-09-26 – 2020-09-27 (×3): 1 [IU] via SUBCUTANEOUS

## 2020-09-24 NOTE — ED Notes (Signed)
Attempted to call report x 1  

## 2020-09-24 NOTE — ED Notes (Signed)
MS Breakfast Ordered 

## 2020-09-24 NOTE — ED Notes (Signed)
Assisted pt on and off of bedside commode. Pt back in bed at this time. No complaints.

## 2020-09-24 NOTE — Progress Notes (Signed)
Pt received to 5M08 via stretcher accompanied by RN and NT.  Bed placed in low position and call light within reach.  Brakes on bed.  No complaints of pain at this time.  Introduced to staff and to unit routine.  MD alerted of patient's admission to unit.

## 2020-09-24 NOTE — Progress Notes (Addendum)
PROGRESS NOTE    [name]   OFB:[id number]  DOB: [birth year]  DOA: 09/23/2020 PCP: Seward Carol, MD   Brief Narrative:  [name] is a 75 year old female with diabetes mellitus type 2, hypertension, CKD stage III, grade 1 diastolic dysfunction, Crohn's disease, right hemicolectomy chronic pain, breast cancer in remission who presented to the hospital for abnormal lab work.  The patient was admitted from 6/18-6/25 with nausea and vomiting and underwent an EGD which showed Diffuse mildly erythematous mucosa was found in the entire examined stomach. Focal mucosal hemorrhages were present.  2 cm hiatal hernia.  Bile was also noted in the gastric antrum Biopsy showed mild chronic gastritis and reactive gastropathy.  H. pylori was negative.  Admission on 5/4-5/8 was for fluid overload-she was discharged on torsemide 40 mg daily  The patient states that she continued to be nauseated after discharge and had poor oral intake up until 2 days ago when her nausea resolved completely and her oral intake improved.  Blood work done at her oncologist office was found to have a creatinine of 4.9, AST 325.  She was at the oncologist office to receive an iron transfusion however this was not done and she was asked to go to the ED.  In the ED: BUN 66, creatinine 4.81, AST 10 and ALT 290-total bili 0.8    Subjective: She has no complaints today.  She has been eating and drinking well and has no recurrence of nausea.    Assessment & Plan:   Principal Problem:   Acute renal failure superimposed on stage CKD 3B Metabolic acidosis related to AKI -Sent to the hospital for AKI-creatinine was 4.91 in the ED - Baseline creatinine is about 1.5-1.7 -Renal ultrasound reveals bilateral simple cysts, bladder distention appears normal - Creatinine has improved to 3.93 - Continue to hold Demadex which had been reduced from 40 mg to 20 mg when she was discharged on 6/25 -Continue sodium  bicarbonate infusion to reduce rate from 100 to 75 cc an hour  Active Problems: Transaminitis -AST 325> 410> 255  ALT 268> 290> 234 - Liver gallbladder ultrasound reveals a small hepatic cyst -She is on allopurinol which is not indicated in renal failure and can cause hepatotoxicity-this is on hold -We will DC Crestor for now (she did receive a dose this morning)  History of prolonged QT right bundle branch block - The patient has not had an EKG on this admission and Zofran as prescribed - We will obtain an EKG -DC Zofran as she has no complaints of nausea any longer - Addendum: QTc 468 today  Chronic gastritis, hiatal hernia, Crohn's disease, right hemicolectomy -Presented with nausea and vomiting in June-EGD on 09/08/20 -Per GIs note on 6/24, there was a suspicion of bile reflux gastritis - Continue Pepcid, budesonide (oral), sucralfate  Diarrhea on last admission -She is on Creon and as needed Imodium  Chronic diastolic heart failure - After her admission for GI issues, Demadex was decreased from 40 to 20 mg - Continue to hold for today and reassess tomorrow    Breast cancer of lower-inner quadrant of right female breast ([place]) -In remission    Diabetic neuropathy associated with type 2 diabetes mellitus (HCC) -Holding linagliptin - continue Lyrica - Continue sensitive sliding scale insulin    Essential hypertension -Continue hydralazine, isosorbide mononitrate, Bystolic, amlodipine  Right peroneal vein DVT - Discovered on 06/29/2020 on venous duplex - Started on apixaban for 3 months-stop date would be roughly 09/29/2020  Depression - On fluoxetine  Gout - Allopurinol on hold due to renal function     Time spent in minutes: 40 DVT prophylaxis: Eliquis Code Status: Full code Family Communication: Daughter at bedside Level of Care: Level of care: Med-Surg Disposition Plan:  Status is: Inpatient  Remains inpatient appropriate because:IV treatments appropriate  due to intensity of illness or inability to take PO  Dispo: The patient is from: Home              Anticipated d/c is to: Home              Patient currently is not medically stable to d/c.   Difficult to place patient No      Consultants:  None Procedures:  None Antimicrobials:  Anti-infectives (From admission, onward)    None        Objective: Vitals:   09/24/20 0700 09/24/20 0715 09/24/20 0800 09/24/20 0900  BP: (!) 123/53 (!) 114/52 (!) 130/58 138/65  Pulse: 72 75 74 77  Resp: 15 15 12 14   Temp:   97.9 F (36.6 C) 97.9 F (36.6 C)  TempSrc:   Oral Oral  SpO2: 98% 99% 97% 98%  Weight:    67.3 kg  Height:    5' 4"  (1.626 m)    Intake/Output Summary (Last 24 hours) at 09/24/2020 1432 Last data filed at 09/24/2020 0208 Gross per 24 hour  Intake 1000 ml  Output 150 ml  Net 850 ml   Filed Weights   09/24/20 0900  Weight: 67.3 kg    Examination: General exam: Appears comfortable  HEENT: PERRLA, oral mucosa moist, no sclera icterus or thrush Respiratory system: Clear to auscultation. Respiratory effort normal. Cardiovascular system: S1 & S2 heard, RRR.   Gastrointestinal system: Abdomen soft, non-tender, nondistended. Normal bowel sounds. Central nervous system: Alert and oriented. No focal neurological deficits. Extremities: No cyanosis, clubbing or edema Skin: No rashes or ulcers Psychiatry:  Mood & affect appropriate.     Data Reviewed: I have personally reviewed following labs and imaging studies  CBC: Recent Labs  Lab 09/23/20 1525 09/23/20 1839 09/24/20 0500  WBC 9.1 11.5* 8.3  NEUTROABS 6.7 8.7*  --   HGB 10.8* 11.1* 10.1*  HCT 33.9* 36.0 31.5*  MCV 84.1 86.3 84.7  PLT 292 316 759   Basic Metabolic Panel: Recent Labs  Lab 09/23/20 1525 09/23/20 1839 09/24/20 0500  NA 141 136 137  K 4.5 4.6 3.6  CL 108 106 108  CO2 19* 18* 21*  GLUCOSE 150* 180* 95  BUN 66* 66* 57*  CREATININE 4.91* 4.81* 3.93*  CALCIUM 10.0 10.0 9.2  MG  --   1.4*  --   PHOS  --  1.5*  --    GFR: Estimated Creatinine Clearance: 12 mL/min (A) (by C-G formula based on SCr of 3.93 mg/dL (H)). Liver Function Tests: Recent Labs  Lab 09/23/20 1525 09/23/20 1839 09/24/20 0500  AST 325* 410* 255*  ALT 268* 290* 234*  ALKPHOS 119 107 93  BILITOT 0.4 0.8 0.6  PROT 7.4 7.3 6.2*  ALBUMIN 3.0* 3.1* 2.7*   No results for input(s): LIPASE, AMYLASE in the last 168 hours. No results for input(s): AMMONIA in the last 168 hours. Coagulation Profile: No results for input(s): INR, PROTIME in the last 168 hours. Cardiac Enzymes: No results for input(s): CKTOTAL, CKMB, CKMBINDEX, TROPONINI in the last 168 hours. BNP (last 3 results) No results for input(s): PROBNP in the last 8760 hours. HbA1C: No results  for input(s): HGBA1C in the last 72 hours. CBG: Recent Labs  Lab 09/24/20 0137 09/24/20 0356 09/24/20 0751 09/24/20 1146  GLUCAP 127* 95 93 106*   Lipid Profile: No results for input(s): CHOL, HDL, LDLCALC, TRIG, CHOLHDL, LDLDIRECT in the last 72 hours. Thyroid Function Tests: No results for input(s): TSH, T4TOTAL, FREET4, T3FREE, THYROIDAB in the last 72 hours. Anemia Panel: No results for input(s): VITAMINB12, FOLATE, FERRITIN, TIBC, IRON, RETICCTPCT in the last 72 hours. Urine analysis:    Component Value Date/Time   COLORURINE YELLOW 09/03/2020 1919   APPEARANCEUR HAZY (A) 09/03/2020 1919   APPEARANCEUR Clear 08/22/2018 1620   LABSPEC 1.016 09/03/2020 1919   LABSPEC 1.020 03/03/2014 1341   PHURINE 5.5 09/03/2020 1919   GLUCOSEU NEGATIVE 09/03/2020 1919   GLUCOSEU Negative 03/03/2014 1341   HGBUR NEGATIVE 09/03/2020 1919   BILIRUBINUR NEGATIVE 09/03/2020 1919   BILIRUBINUR Negative 08/22/2018 1620   BILIRUBINUR Negative 03/03/2014 1341   KETONESUR NEGATIVE 09/03/2020 1919   PROTEINUR 100 (A) 09/03/2020 1919   UROBILINOGEN 0.2 03/03/2014 1341   NITRITE NEGATIVE 09/03/2020 1919   LEUKOCYTESUR SMALL (A) 09/03/2020 1919    LEUKOCYTESUR Negative 03/03/2014 1341   Sepsis Labs: @LABRCNTIP (procalcitonin:4,lacticidven:4) ) Recent Results (from the past 240 hour(s))  SARS CORONAVIRUS 2 (TAT 6-24 HRS) Nasopharyngeal Nasopharyngeal Swab     Status: None   Collection Time: 09/23/20 10:10 PM   Specimen: Nasopharyngeal Swab  Result Value Ref Range Status   SARS Coronavirus 2 NEGATIVE NEGATIVE Final    Comment: (NOTE) SARS-CoV-2 target nucleic acids are NOT DETECTED.  The SARS-CoV-2 RNA is generally detectable in upper and lower respiratory specimens during the acute phase of infection. Negative results do not preclude SARS-CoV-2 infection, do not rule out co-infections with other pathogens, and should not be used as the sole basis for treatment or other patient management decisions. Negative results must be combined with clinical observations, patient history, and epidemiological information. The expected result is Negative.  Fact Sheet for Patients: SugarRoll.be  Fact Sheet for Healthcare Providers: https://www.woods-mathews.com/  This test is not yet approved or cleared by the [place] FDA and  has been authorized for detection and/or diagnosis of SARS-CoV-2 by FDA under an Emergency Use Authorization (EUA). This EUA will remain  in effect (meaning this test can be used) for the duration of the COVID-19 declaration under Se ction 564(b)(1) of the Act, 21 U.S.C. section 360bbb-3(b)(1), unless the authorization is terminated or revoked sooner.  Performed at [facility], [place] [address] [place], [place] [address]          Radiology Studies: US RENAL  Result Date: 09/24/2020 CLINICAL DATA:  Acute renal failure EXAM: RENAL / URINARY TRACT ULTRASOUND COMPLETE COMPARISON:  09/03/2020, ultrasound from 07/20/2020 FINDINGS: Right Kidney: Renal measurements: 10.3 x 5.9 x 6.4 cm. = volume: 204 mL. Cysts are noted within the right kidney the largest of which  measures 2.3 cm. No mass lesion or hydronephrosis is noted. Diffuse increased echogenicity is seen. Left Kidney: Renal measurements: 11.4 x 5.6 x 5.9 cm. = volume: 198 mL. Cysts are noted on the left the largest of which measures 2.3 cm. Generalized increased echogenicity is noted. Bladder: Appears normal for degree of bladder distention. Other: None. IMPRESSION: Bilateral simple renal cysts. Increased echogenicity consistent with medical renal disease. Electronically Signed   By: Inez Catalina M.D.   On: 09/24/2020 00:19   US Abdomen Limited RUQ (LIVER/GB)  Result Date: 09/24/2020 CLINICAL DATA:  Elevated LFTs EXAM: ULTRASOUND ABDOMEN LIMITED  RIGHT UPPER QUADRANT COMPARISON:  CT from 09/03/2020 FINDINGS: Gallbladder: Surgically removed Common bile duct: Diameter: 5.2 mm. Liver: Small 8 mm cyst is noted in the left lobe of the liver. Within normal limits in parenchymal echogenicity. Portal vein is patent on color Doppler imaging with normal direction of blood flow towards the liver. Other: None. IMPRESSION: Small hepatic cyst. No other focal abnormality is noted. Electronically Signed   By: Inez Catalina M.D.   On: 09/24/2020 00:21      Scheduled Meds:  amLODipine  10 mg Oral Daily   apixaban  5 mg Oral BID   budesonide  3 mg Oral Daily   famotidine  40 mg Oral QHS   FLUoxetine  20 mg Oral QHS   hydrALAZINE  100 mg Oral BID   insulin aspart  0-9 Units Subcutaneous Q4H   isosorbide mononitrate  15 mg Oral Daily   lipase/protease/amylase  36,000 Units Oral TID WC   melatonin  10 mg Oral QHS   nebivolol  10 mg Oral Daily   pantoprazole  40 mg Oral Daily   pregabalin  25 mg Oral BID   rosuvastatin  5 mg Oral Daily   sucralfate  1 g Oral TID WC & HS   Continuous Infusions:   sodium bicarbonate (isotonic) infusion in sterile water 100 mL/hr at 09/24/20 0210     LOS: 1 day      Debbe Odea, MD [facility] Pager: [email or web address] 09/24/2020, 2:32 PM

## 2020-09-24 NOTE — Progress Notes (Addendum)
NEW ADMISSION NOTE New Admission Note:   Arrival Method: stretcher Mental Orientation: A&O4 Telemetry: none Assessment: Completed Skin: intact dry flaky. Lump under right breast IV: LEFT UPPER ARM Pain: 0/10 Tubes: NONE Safety Measures: Safety Fall Prevention Plan has been given, discussed and signed Admission: Completed [facility] Orientation: Patient has been orientated to the room, unit and staff.  Family:none at bedside  Orders have been reviewed and implemented. Will continue to monitor the patient. Call light has been placed within reach and bed alarm has been activated.   Linde Wilensky S Amiria Orrison, RN

## 2020-09-25 DIAGNOSIS — I10 Essential (primary) hypertension: Secondary | ICD-10-CM | POA: Diagnosis not present

## 2020-09-25 DIAGNOSIS — I5032 Chronic diastolic (congestive) heart failure: Secondary | ICD-10-CM | POA: Diagnosis not present

## 2020-09-25 DIAGNOSIS — E1142 Type 2 diabetes mellitus with diabetic polyneuropathy: Secondary | ICD-10-CM | POA: Diagnosis not present

## 2020-09-25 DIAGNOSIS — N179 Acute kidney failure, unspecified: Secondary | ICD-10-CM | POA: Diagnosis not present

## 2020-09-25 LAB — COMPREHENSIVE METABOLIC PANEL
ALT: 183 U/L — ABNORMAL HIGH (ref 0–44)
ALT: 173 U/L — ABNORMAL HIGH (ref 0–44)
AST: 153 U/L — ABNORMAL HIGH (ref 15–41)
AST: 107 U/L — ABNORMAL HIGH (ref 15–41)
Albumin: 2.5 g/dL — ABNORMAL LOW (ref 3.5–5.0)
Albumin: 2.6 g/dL — ABNORMAL LOW (ref 3.5–5.0)
Alkaline Phosphatase: 88 U/L (ref 38–126)
Alkaline Phosphatase: 95 U/L (ref 38–126)
Anion gap: 9 (ref 5–15)
Anion gap: 11 (ref 5–15)
BUN: 52 mg/dL — ABNORMAL HIGH (ref 8–23)
BUN: 47 mg/dL — ABNORMAL HIGH (ref 8–23)
CO2: 25 mmol/L (ref 22–32)
CO2: 27 mmol/L (ref 22–32)
Calcium: 8.7 mg/dL — ABNORMAL LOW (ref 8.9–10.3)
Calcium: 9 mg/dL (ref 8.9–10.3)
Chloride: 102 mmol/L (ref 98–111)
Chloride: 97 mmol/L — ABNORMAL LOW (ref 98–111)
Creatinine, Ser: 3.38 mg/dL — ABNORMAL HIGH (ref 0.44–1.00)
Creatinine, Ser: 3.09 mg/dL — ABNORMAL HIGH (ref 0.44–1.00)
GFR, Estimated: 14 mL/min — ABNORMAL LOW (ref >60)
GFR, Estimated: 15 mL/min — ABNORMAL LOW (ref >60)
Glucose, Bld: 132 mg/dL — ABNORMAL HIGH (ref 70–99)
Glucose, Bld: 108 mg/dL — ABNORMAL HIGH (ref 70–99)
Potassium: 3.1 mmol/L — ABNORMAL LOW (ref 3.5–5.1)
Potassium: 3.4 mmol/L — ABNORMAL LOW (ref 3.5–5.1)
Sodium: 136 mmol/L (ref 135–145)
Sodium: 135 mmol/L (ref 135–145)
Total Bilirubin: 0.8 mg/dL (ref 0.3–1.2)
Total Bilirubin: 0.7 mg/dL (ref 0.3–1.2)
Total Protein: 5.5 g/dL — ABNORMAL LOW (ref 6.5–8.1)
Total Protein: 6.2 g/dL — ABNORMAL LOW (ref 6.5–8.1)

## 2020-09-25 LAB — GLUCOSE, CAPILLARY
Glucose-Capillary: 101 mg/dL — ABNORMAL HIGH (ref 70–99)
Glucose-Capillary: 79 mg/dL (ref 70–99)
Glucose-Capillary: 96 mg/dL (ref 70–99)
Glucose-Capillary: 178 mg/dL — ABNORMAL HIGH (ref 70–99)
Glucose-Capillary: 187 mg/dL — ABNORMAL HIGH (ref 70–99)
Glucose-Capillary: 155 mg/dL — ABNORMAL HIGH (ref 70–99)

## 2020-09-25 LAB — MAGNESIUM: Magnesium: 1.5 mg/dL — ABNORMAL LOW (ref 1.7–2.4)

## 2020-09-25 MED ORDER — POTASSIUM CHLORIDE CRYS ER 20 MEQ PO TBCR
40.0000 meq | EXTENDED_RELEASE_TABLET | ORAL | Status: AC
  Administered 2020-09-25 (×2): 40 meq via ORAL
  Filled 2020-09-25 (×2): qty 2

## 2020-09-25 MED ORDER — MAGNESIUM SULFATE 2 GM/50ML IV SOLN
2.0000 g | Freq: Once | INTRAVENOUS | Status: AC
  Administered 2020-09-25: 2 g via INTRAVENOUS
  Filled 2020-09-25: qty 50

## 2020-09-25 MED ORDER — FAMOTIDINE 20 MG PO TABS
20.0000 mg | ORAL_TABLET | Freq: Every day | ORAL | Status: DC
  Administered 2020-09-25 – 2020-09-26 (×2): 20 mg via ORAL
  Filled 2020-09-25 (×2): qty 1

## 2020-09-25 NOTE — Progress Notes (Signed)
PROGRESS NOTE    [name]   FGH:[id number]  DOB: 1945/08/14  DOA: 09/23/2020 PCP: Seward Carol, MD   Brief Narrative:  [name] is a 75 year old female with diabetes mellitus type 2, hypertension, CKD stage III, grade 1 diastolic dysfunction, Crohn's disease, right hemicolectomy chronic pain, breast cancer in remission who presented to the hospital for abnormal lab work.  The patient was admitted from 6/18-6/25 with nausea and vomiting and underwent an EGD which showed Diffuse mildly erythematous mucosa was found in the entire examined stomach. Focal mucosal hemorrhages were present.  2 cm hiatal hernia.  Bile was also noted in the gastric antrum Biopsy showed mild chronic gastritis and reactive gastropathy.  H. pylori was negative.  Admission on 5/4-5/8 was for fluid overload-she was discharged on torsemide 40 mg daily  The patient states that she continued to be nauseated after discharge and had poor oral intake up until 2 days ago when her nausea resolved completely and her oral intake improved.  Blood work done at her oncologist office was found to have a creatinine of 4.9, AST 325.  She was at the oncologist office to receive an iron transfusion however this was not done and she was asked to go to the ED.  In the ED: BUN 66, creatinine 4.81, AST 10 and ALT 290-total bili 0.8    Subjective: She has no complaints.   Assessment & Plan:   Principal Problem:   Acute renal failure superimposed on stage CKD 3B Metabolic acidosis related to AKI -Sent to the hospital for AKI-creatinine was 4.91 in the ED - Baseline creatinine is about 1.5-1.7 -Renal ultrasound reveals bilateral simple cysts, bladder distention appears normal - Creatinine has improved to 3.93 - Continue to hold Demadex which had been reduced from 40 mg to 20 mg when she was discharged on 6/25 -Continue sodium bicarbonate infusion to reduce rate from 100 to 75 cc an hour - 710- nephrology stopping  IVF- will recheck Cr tomorrow and consider dc if improving  Active Problems: Transaminitis -AST 325> 410>> subsequently improving  ALT 268> 290 subsequently improvingg - Liver gallbladder ultrasound reveals a small hepatic cyst -She is on allopurinol and Periactin which are on hold -We will DC Crestor for now (she did receive a dose this morning)  History of prolonged QT right bundle branch block - QTc 468 ton 7/9  Chronic gastritis, hiatal hernia, Crohn's disease, right hemicolectomy -Presented with nausea and vomiting in June-EGD on 09/08/20 -Per GIs note on 6/24, there was a suspicion of bile reflux gastritis - Continue Pepcid, Creon, budesonide (oral), sucralfate  Chronic diastolic heart failure - After her admission for GI issues, Demadex was decreased from 40 to 20 mg - Continue to hold for today and reassess tomorrow    Breast cancer of lower-inner quadrant of right female breast ([place]) -In remission    Diabetic neuropathy associated with type 2 diabetes mellitus (HCC) -Holding linagliptin - continue Lyrica - Continue sensitive sliding scale insulin    Essential hypertension -Continue hydralazine, isosorbide mononitrate, Bystolic, amlodipine  Right peroneal vein DVT - Discovered on 06/29/2020 on venous duplex - Started on apixaban for 3 months-stop date would be roughly 09/29/2020  Depression - On fluoxetine  Gout - Allopurinol on hold due to renal function     Time spent in minutes: 45 DVT prophylaxis: Eliquis Code Status: Full code Family Communication: Daughter at bedside, [place] Level of Care: Level of care: Med-Surg Disposition Plan:  Status is: Inpatient  Remains  inpatient appropriate because:IV treatments appropriate due to intensity of illness or inability to take PO  Dispo: The patient is from: Home              Anticipated d/c is to: Home              Patient currently is not medically stable to d/c.   Difficult to place patient  No Consultants:  Nephrology Procedures:  None Antimicrobials:  Anti-infectives (From admission, onward)    None        Objective: Vitals:   09/24/20 2100 09/25/20 0425 09/25/20 0427 09/25/20 0848  BP: 136/70 135/65 139/67 (!) 142/67  Pulse: 81 75 76 73  Resp: 17   20  Temp: 98.3 F (36.8 C) 97.7 F (36.5 C) 97.7 F (36.5 C) 98.1 F (36.7 C)  TempSrc: Oral Oral Oral Oral  SpO2: 99% 96% 95% 90%  Weight:      Height:        Intake/Output Summary (Last 24 hours) at 09/25/2020 1716 Last data filed at 09/25/2020 1524 Gross per 24 hour  Intake 4130.05 ml  Output 375 ml  Net 3755.05 ml    Filed Weights   09/24/20 0900  Weight: 67.3 kg    Examination: General exam: Appears comfortable  HEENT: PERRLA, oral mucosa moist, no sclera icterus or thrush Respiratory system: Clear to auscultation. Respiratory effort normal. Cardiovascular system: S1 & S2 heard, regular rate and rhythm Gastrointestinal system: Abdomen soft, non-tender, nondistended. Normal bowel sounds   Central nervous system: Alert and oriented. No focal neurological deficits. Extremities: No cyanosis, clubbing- mild pedal edema Skin: No rashes or ulcers Psychiatry:  Mood & affect appropriate.      Data Reviewed: I have personally reviewed following labs and imaging studies  CBC: Recent Labs  Lab 09/23/20 1525 09/23/20 1839 09/24/20 0500  WBC 9.1 11.5* 8.3  NEUTROABS 6.7 8.7*  --   HGB 10.8* 11.1* 10.1*  HCT 33.9* 36.0 31.5*  MCV 84.1 86.3 84.7  PLT 292 316 585    Basic Metabolic Panel: Recent Labs  Lab 09/23/20 1839 09/24/20 0500 09/24/20 1605 09/25/20 0020 09/25/20 1231  NA 136 137 138 136 135  K 4.6 3.6 3.1* 3.1* 3.4*  CL 106 108 107 102 97*  CO2 18* 21* 23 25 27   GLUCOSE 180* 95 110* 132* 108*  BUN 66* 57* 57* 52* 47*  CREATININE 4.81* 3.93* 3.46* 3.38* 3.09*  CALCIUM 10.0 9.2 8.9 8.7* 9.0  MG 1.4*  --   --  1.5*  --   PHOS 1.5*  --   --   --   --     GFR: Estimated  Creatinine Clearance: 15.3 mL/min (A) (by C-G formula based on SCr of 3.09 mg/dL (H)). Liver Function Tests: Recent Labs  Lab 09/23/20 1839 09/24/20 0500 09/24/20 1605 09/25/20 0020 09/25/20 1231  AST 410* 255* 187* 153* 107*  ALT 290* 234* 207* 183* 173*  ALKPHOS 107 93 92 88 95  BILITOT 0.8 0.6 0.7 0.8 0.7  PROT 7.3 6.2* 5.8* 5.5* 6.2*  ALBUMIN 3.1* 2.7* 2.5* 2.5* 2.6*    No results for input(s): LIPASE, AMYLASE in the last 168 hours. No results for input(s): AMMONIA in the last 168 hours. Coagulation Profile: No results for input(s): INR, PROTIME in the last 168 hours. Cardiac Enzymes: No results for input(s): CKTOTAL, CKMB, CKMBINDEX, TROPONINI in the last 168 hours. BNP (last 3 results) No results for input(s): PROBNP in the last 8760 hours. HbA1C: No results  for input(s): HGBA1C in the last 72 hours. CBG: Recent Labs  Lab 09/25/20 0423 09/25/20 0811 09/25/20 1126 09/25/20 1658 09/25/20 1701  GLUCAP 101* 79 96 187* 178*    Lipid Profile: No results for input(s): CHOL, HDL, LDLCALC, TRIG, CHOLHDL, LDLDIRECT in the last 72 hours. Thyroid Function Tests: No results for input(s): TSH, T4TOTAL, FREET4, T3FREE, THYROIDAB in the last 72 hours. Anemia Panel: No results for input(s): VITAMINB12, FOLATE, FERRITIN, TIBC, IRON, RETICCTPCT in the last 72 hours. Urine analysis:    Component Value Date/Time   COLORURINE YELLOW 09/03/2020 1919   APPEARANCEUR HAZY (A) 09/03/2020 1919   APPEARANCEUR Clear 08/22/2018 1620   LABSPEC 1.016 09/03/2020 1919   LABSPEC 1.020 03/03/2014 1341   PHURINE 5.5 09/03/2020 1919   GLUCOSEU NEGATIVE 09/03/2020 1919   GLUCOSEU Negative 03/03/2014 1341   HGBUR NEGATIVE 09/03/2020 1919   BILIRUBINUR NEGATIVE 09/03/2020 1919   BILIRUBINUR Negative 08/22/2018 1620   BILIRUBINUR Negative 03/03/2014 1341   KETONESUR NEGATIVE 09/03/2020 1919   PROTEINUR 100 (A) 09/03/2020 1919   UROBILINOGEN 0.2 03/03/2014 1341   NITRITE NEGATIVE 09/03/2020  1919   LEUKOCYTESUR SMALL (A) 09/03/2020 1919   LEUKOCYTESUR Negative 03/03/2014 1341   Sepsis Labs: @LABRCNTIP (procalcitonin:4,lacticidven:4) ) Recent Results (from the past 240 hour(s))  SARS CORONAVIRUS 2 (TAT 6-24 HRS) Nasopharyngeal Nasopharyngeal Swab     Status: None   Collection Time: 09/23/20 10:10 PM   Specimen: Nasopharyngeal Swab  Result Value Ref Range Status   SARS Coronavirus 2 NEGATIVE NEGATIVE Final    Comment: (NOTE) SARS-CoV-2 target nucleic acids are NOT DETECTED.  The SARS-CoV-2 RNA is generally detectable in upper and lower respiratory specimens during the acute phase of infection. Negative results do not preclude SARS-CoV-2 infection, do not rule out co-infections with other pathogens, and should not be used as the sole basis for treatment or other patient management decisions. Negative results must be combined with clinical observations, patient history, and epidemiological information. The expected result is Negative.  Fact Sheet for Patients: SugarRoll.be  Fact Sheet for Healthcare Providers: https://www.woods-mathews.com/  This test is not yet approved or cleared by the [place] FDA and  has been authorized for detection and/or diagnosis of SARS-CoV-2 by FDA under an Emergency Use Authorization (EUA). This EUA will remain  in effect (meaning this test can be used) for the duration of the COVID-19 declaration under Se ction 564(b)(1) of the Act, 21 U.S.C. section 360bbb-3(b)(1), unless the authorization is terminated or revoked sooner.  Performed at [facility], [place] [address] [place], [place] [address]          Radiology Studies: US RENAL  Result Date: 09/24/2020 CLINICAL DATA:  Acute renal failure EXAM: RENAL / URINARY TRACT ULTRASOUND COMPLETE COMPARISON:  09/03/2020, ultrasound from 07/20/2020 FINDINGS: Right Kidney: Renal measurements: 10.3 x 5.9 x 6.4 cm. = volume: 204 mL. Cysts are  noted within the right kidney the largest of which measures 2.3 cm. No mass lesion or hydronephrosis is noted. Diffuse increased echogenicity is seen. Left Kidney: Renal measurements: 11.4 x 5.6 x 5.9 cm. = volume: 198 mL. Cysts are noted on the left the largest of which measures 2.3 cm. Generalized increased echogenicity is noted. Bladder: Appears normal for degree of bladder distention. Other: None. IMPRESSION: Bilateral simple renal cysts. Increased echogenicity consistent with medical renal disease. Electronically Signed   By: Inez Catalina M.D.   On: 09/24/2020 00:19   US Abdomen Limited RUQ (LIVER/GB)  Result Date: 09/24/2020 CLINICAL DATA:  Elevated LFTs  EXAM: ULTRASOUND ABDOMEN LIMITED RIGHT UPPER QUADRANT COMPARISON:  CT from 09/03/2020 FINDINGS: Gallbladder: Surgically removed Common bile duct: Diameter: 5.2 mm. Liver: Small 8 mm cyst is noted in the left lobe of the liver. Within normal limits in parenchymal echogenicity. Portal vein is patent on color Doppler imaging with normal direction of blood flow towards the liver. Other: None. IMPRESSION: Small hepatic cyst. No other focal abnormality is noted. Electronically Signed   By: Inez Catalina M.D.   On: 09/24/2020 00:21      Scheduled Meds:  amLODipine  10 mg Oral Daily   apixaban  5 mg Oral BID   budesonide  3 mg Oral Daily   famotidine  20 mg Oral QHS   FLUoxetine  20 mg Oral QHS   hydrALAZINE  100 mg Oral BID   insulin aspart  0-9 Units Subcutaneous TID WC   isosorbide mononitrate  15 mg Oral Daily   lipase/protease/amylase  36,000 Units Oral TID WC   melatonin  10 mg Oral QHS   nebivolol  10 mg Oral Daily   pantoprazole  40 mg Oral Daily   pregabalin  25 mg Oral BID   sucralfate  1 g Oral TID WC & HS   Continuous Infusions:   sodium bicarbonate (isotonic) infusion in sterile water 75 mL/hr at 09/25/20 1703     LOS: 2 days      Debbe Odea, MD [facility] Pager: [email or web address] 09/25/2020, 5:16 PM

## 2020-09-25 NOTE — Consult Note (Signed)
Nephrology Consult   Requesting provider: Debbe Odea, MD   Assessment/Recommendations:   AKI on CKD 4: Improving -AKI secondary to prerenal injury: Nausea/vomiting, decreased p.o. intake in the context of torsemide use -Continue to hold torsemide -Stop IV fluids.  Encouraged her to eat and hydrate to thirst (with water) -UA with sediment exam ordered -No indication for renal replacement therapy -Renal ultrasound without obstruction -Continue to monitor daily Cr, Dose meds for GFR<15 -Monitor Daily I/Os, Daily weight  -Maintain MAP>65 for optimal renal perfusion.  -Agree with holding ACE-I, avoid further nephrotoxins including NSAIDS, Morphine.  Unless absolutely necessary, avoid CT with contrast and/or MRI with gadolinium.     Metabolic acidosis, improved/resolved -Bicarb on presentation was 19.  This likely related to AKI.  Stop bicarb based fluids.  Her bicarb is 27 today  Diastolic CHF -Diurese if needed.  Has some left lower extremity swelling.  Stopping IV fluids  Hypertension: -Resume home meds  Normocytic anemia, anemia of chronic kidney disease: -Transfuse for Hgb<7 g/dL -Hemoglobin okay, monitor for now  Diabetes Mellitus Type 2 with Hyperglycemia -Per primary  Elevated LFTs -per primary.  Statin and allopurinol on hold   Recommendations conveyed to primary service.    [facility] 09/25/2020 1:42 PM   _____________________________________________________________________________________   History of Present Illness: [name] is a/an 75 y.o. female with a past medical history of CKD4, DM2, hypertension, diastolic CHF, Crohn's, right hemicolectomy, breast cancer in remission who presents to [facility] with abnormal blood work.  She was recently admitted from 6/18 6-25 with nausea and vomiting.  She underwent an EGD which showed gastritis.  Prior to that she was admitted in May for fluid overload and discharged on  torsemide. Since her most recent discharge she has had poor p.o. intake and nausea.  Her nausea recently improved/resolved.  She went to her oncologist office on 7/8.  At that time, creatinine was 1.9, EGFR 9, alk phos 119, AST 325, ALT 268.  Since then, her creatinine has been improving. There is a question in regards to fluids and diuretics moving forward. Patient currently reports feeling much better.  Appetite is returning. She does report that her left leg is starting to swell. Denies any fevers, chills, chest pain, shortness of breath, orthopnea, nausea/vomiting, dysgeusia, loss of appetite, brain fog, intractable hiccups or pruritus, dysuria, change in urinary frequency. Follows with Dr. Hollie Salk at our office.  Medications:  Current Facility-Administered Medications  Medication Dose Route Frequency Provider Last Rate Last Admin   acetaminophen (TYLENOL) tablet 650 mg  650 mg Oral Q6H PRN Etta Quill, DO       Or   acetaminophen (TYLENOL) suppository 650 mg  650 mg Rectal Q6H PRN Etta Quill, DO       amLODipine (NORVASC) tablet 10 mg  10 mg Oral Daily Jennette Kettle M, DO   10 mg at 09/25/20 1610   apixaban (ELIQUIS) tablet 5 mg  5 mg Oral BID Etta Quill, DO   5 mg at 09/25/20 0852   budesonide (ENTOCORT EC) 24 hr capsule 3 mg  3 mg Oral Daily Jennette Kettle M, DO   3 mg at 09/25/20 9604   famotidine (PEPCID) tablet 20 mg  20 mg Oral QHS Debbe Odea, MD       FLUoxetine (PROZAC) capsule 20 mg  20 mg Oral QHS Jennette Kettle M, DO   20 mg at 09/24/20 2152   hydrALAZINE (APRESOLINE) tablet 100 mg  100 mg Oral  BID Jennette Kettle M, DO   100 mg at 09/25/20 0851   insulin aspart (novoLOG) injection 0-9 Units  0-9 Units Subcutaneous TID WC Rizwan, Eunice Blase, MD       isosorbide mononitrate (IMDUR) 24 hr tablet 15 mg  15 mg Oral Daily Jennette Kettle M, DO   15 mg at 09/25/20 4163   lipase/protease/amylase (CREON) capsule 36,000 Units  36,000 Units Oral TID WC Etta Quill, DO    36,000 Units at 09/25/20 1219   loperamide (IMODIUM) capsule 4 mg  4 mg Oral QID PRN Etta Quill, DO       melatonin tablet 10 mg  10 mg Oral QHS Jennette Kettle M, DO   10 mg at 09/24/20 2152   nebivolol (BYSTOLIC) tablet 10 mg  10 mg Oral Daily Jennette Kettle M, DO   10 mg at 09/25/20 0853   pantoprazole (PROTONIX) EC tablet 40 mg  40 mg Oral Daily Jennette Kettle M, DO   40 mg at 09/25/20 8453   potassium chloride SA (KLOR-CON) CR tablet 40 mEq  40 mEq Oral Q4H Rizwan, Eunice Blase, MD   40 mEq at 09/25/20 1219   pregabalin (LYRICA) capsule 25 mg  25 mg Oral BID Jennette Kettle M, DO   25 mg at 09/25/20 6468   sodium bicarbonate 150 mEq in sterile water 1,150 mL infusion   Intravenous Continuous Debbe Odea, MD 75 mL/hr at 09/25/20 0856 Infusion Verify at 09/25/20 0856   sucralfate (CARAFATE) tablet 1 g  1 g Oral TID WC & HS Etta Quill, DO   1 g at 09/25/20 1219     ALLERGIES Percocet [oxycodone-acetaminophen]; Diazepam; Haloperidol lactate; Lorazepam; Morphine sulfate; Propoxyphene hcl; Tramadol hcl; Anesthetics, halogenated; Etomidate; Fentanyl; Haloperidol; Morphine sulfate; Oxycodone-acetaminophen; Propoxyphene; Tramadol hcl; and Versed [midazolam]  MEDICAL HISTORY Past Medical History:  Diagnosis Date   Abdominal discomfort    Chronic N/V/D. Presumptive dx Crohn's dx per elevated p ANCA. Failed Entocort and Pentasa. Sep 2003 - ileocolectomy c anastomosis per Dr Deon Pilling 2/2 adhesions - path was hegative for Crohns. EGD, Sm bowel follow through (11/03), and an eteroclysis (10/03) were unrevealing. Cuases hypomag and hypocalcemia.   Adnexal mass 8/03   s/p lap BSO (R ovarian fibroma) & lysis of adhesions   Allergy    Seasonal   Anemia    Multifactorial. Baseline HgB 10-11 ish. B12 def - 150 in 3/10. Fe Def - ferritin 35 3/10. Both are being repleted.   Breast cancer ([place]) 03/16/13   right, 5 o'clock   CAD (coronary artery disease) 1996   1996 - PTCA and angioplasty diagonal branch.  2000 - Rotoblator & angiopllasty of diagonal. 2006 - subendocardial AMI, DES to proximal LAD.Marland Kitchen Also had 90% stenosis in distal apical LAD. EF 55 with apical hypokinesis. Indefinite ASA and Plavix.   CHF (congestive heart failure) (HCC)    Chronic kidney disease    Chronic renal insuff baseline Cr 1.2 - 1.4 ish.   Chronic pain    CT 10/10 = Spinal stenosis L2 - S1.   Diabetes mellitus    Insulin dependent   Gout    Hx of radiation therapy 06/02/13- 07/16/13   right rbeast 4500 cGy 25 sessions, right breast boost 1600 cGy in 8 sessions   Hyperlipidemia    Managed with both a statin and Welchol. Welchol stopped 2014 2/2 cost and started on fenofibrate    Hypertension    2006 B renal arteries patent. 2003 MRA - no RAS. 2003 pheo  W/U Dr Hassell Done reportedly negative.   Hypoxia 07/23/2017   Lupus (HCC)    Lymphedema of breast    Personal history of radiation therapy 2015   RBBB    Renal vein thrombosis (HCC)    SBO (small bowel obstruction) ([place]) 09/17/2017   Secondary hyperparathyroidism ([place])    Stroke ([place])    Incidental finding MRI 2002 L lacunar infarct   Vitamin B12 deficiency    Vitamin D deficiency    Wears dentures    top     SOCIAL HISTORY Social History   Socioeconomic History   Marital status: Legally Separated    Spouse name: Not on file   Number of children: 5   Years of education: Not on file   Highest education level: Not on file  Occupational History   Occupation: retired    Fish farm manager: [employer]  Tobacco Use   Smoking status: Never   Smokeless tobacco: Never  Vaping Use   Vaping Use: Never used  Substance and Sexual Activity   Alcohol use: No    Alcohol/week: 0.0 standard drinks   Drug use: No   Sexual activity: Not on file  Other Topics Concern   Not on file  Social History Narrative   She is separated 2 sons 3 daughters   She has grandchildren and great-grandchildren   She is retired   Never smoker no tobacco now no drugs no alcohol no  caffeine   Social Determinants of Radio broadcast assistant Strain: Not on file  Food Insecurity: Food Insecurity Present   Worried About Charity fundraiser in the Last Year: Sometimes true   Arboriculturist in the Last Year: Sometimes true  Transportation Needs: No Transportation Needs   Lack of Transportation (Medical): No   Lack of Transportation (Non-Medical): No  Physical Activity: Not on file  Stress: Not on file  Social Connections: Not on file  Intimate Partner Violence: Not on file     FAMILY HISTORY Family History  Problem Relation Age of Onset   Pancreatic cancer Brother 85   Breast cancer Mother 72   Lung cancer Maternal Aunt    Breast cancer Maternal Aunt    Prostate cancer Maternal Uncle    Heart attack Maternal Grandmother    Breast cancer Maternal Grandmother    Colon cancer Maternal Grandmother    Clotting disorder Maternal Grandmother    Diabetes Maternal Grandmother    Kidney failure Maternal Aunt    Hypertension Daughter       Review of Systems: 12 systems reviewed Otherwise as per HPI, all other systems reviewed and negative  Physical Exam: Vitals:   09/25/20 0427 09/25/20 0848  BP: 139/67 (!) 142/67  Pulse: 76 73  Resp:  20  Temp: 97.7 F (36.5 C) 98.1 F (36.7 C)  SpO2: 95% 90%   Total I/O In: 2875 [P.O.:575; I.V.:2300] Out: 375 [Urine:375]  Intake/Output Summary (Last 24 hours) at 09/25/2020 1342 Last data filed at 09/25/2020 1330 Gross per 24 hour  Intake 3595 ml  Output 375 ml  Net 3220 ml   General: well-appearing, no acute distress HEENT: anicteric sclera, oropharynx clear without lesions CV: regular rate, normal rhythm, no murmurs, no gallops, no rubs Lungs: clear to auscultation bilaterally, normal work of breathing Abd: soft, non-tender, non-distended Skin: no visible lesions or rashes Psych: alert, engaged, appropriate mood and affect Musculoskeletal: Trace edema left lower extremity Neuro: normal speech, no gross  focal deficits   Test Results  Reviewed Lab Results  Component Value Date   NA 135 09/25/2020   K 3.4 (L) 09/25/2020   CL 97 (L) 09/25/2020   CO2 27 09/25/2020   BUN 47 (H) 09/25/2020   CREATININE 3.09 (H) 09/25/2020   GFR 28.35 (L) 10/25/2017   CALCIUM 9.0 09/25/2020   ALBUMIN 2.6 (L) 09/25/2020   PHOS 1.5 (L) 09/23/2020     I have reviewed all relevant outside healthcare records related to the patient's kidney injury.

## 2020-09-26 DIAGNOSIS — I10 Essential (primary) hypertension: Secondary | ICD-10-CM | POA: Diagnosis not present

## 2020-09-26 DIAGNOSIS — E1142 Type 2 diabetes mellitus with diabetic polyneuropathy: Secondary | ICD-10-CM | POA: Diagnosis not present

## 2020-09-26 DIAGNOSIS — E876 Hypokalemia: Secondary | ICD-10-CM

## 2020-09-26 DIAGNOSIS — N179 Acute kidney failure, unspecified: Secondary | ICD-10-CM | POA: Diagnosis not present

## 2020-09-26 DIAGNOSIS — N184 Chronic kidney disease, stage 4 (severe): Secondary | ICD-10-CM | POA: Diagnosis not present

## 2020-09-26 DIAGNOSIS — D631 Anemia in chronic kidney disease: Secondary | ICD-10-CM

## 2020-09-26 LAB — URINALYSIS, ROUTINE W REFLEX MICROSCOPIC
Bilirubin Urine: NEGATIVE
Glucose, UA: NEGATIVE mg/dL
Hgb urine dipstick: NEGATIVE
Ketones, ur: NEGATIVE mg/dL
Leukocytes,Ua: NEGATIVE
Nitrite: NEGATIVE
Protein, ur: NEGATIVE mg/dL
Specific Gravity, Urine: 1.01 (ref 1.005–1.030)
pH: 7 (ref 5.0–8.0)

## 2020-09-26 LAB — MAGNESIUM: Magnesium: 1.9 mg/dL (ref 1.7–2.4)

## 2020-09-26 LAB — BASIC METABOLIC PANEL
Anion gap: 8 (ref 5–15)
BUN: 40 mg/dL — ABNORMAL HIGH (ref 8–23)
CO2: 31 mmol/L (ref 22–32)
Calcium: 8.6 mg/dL — ABNORMAL LOW (ref 8.9–10.3)
Chloride: 97 mmol/L — ABNORMAL LOW (ref 98–111)
Creatinine, Ser: 2.92 mg/dL — ABNORMAL HIGH (ref 0.44–1.00)
GFR, Estimated: 16 mL/min — ABNORMAL LOW (ref >60)
Glucose, Bld: 121 mg/dL — ABNORMAL HIGH (ref 70–99)
Potassium: 4 mmol/L (ref 3.5–5.1)
Sodium: 136 mmol/L (ref 135–145)

## 2020-09-26 LAB — GLUCOSE, CAPILLARY
Glucose-Capillary: 108 mg/dL — ABNORMAL HIGH (ref 70–99)
Glucose-Capillary: 137 mg/dL — ABNORMAL HIGH (ref 70–99)
Glucose-Capillary: 142 mg/dL — ABNORMAL HIGH (ref 70–99)
Glucose-Capillary: 153 mg/dL — ABNORMAL HIGH (ref 70–99)

## 2020-09-26 MED ORDER — POTASSIUM CHLORIDE CRYS ER 20 MEQ PO TBCR
40.0000 meq | EXTENDED_RELEASE_TABLET | ORAL | Status: DC
  Administered 2020-09-26: 40 meq via ORAL
  Filled 2020-09-26: qty 2

## 2020-09-26 MED ORDER — ADULT MULTIVITAMIN W/MINERALS CH
1.0000 | ORAL_TABLET | Freq: Every day | ORAL | Status: DC
  Administered 2020-09-26 – 2020-09-27 (×2): 1 via ORAL
  Filled 2020-09-26 (×2): qty 1

## 2020-09-26 MED ORDER — ENSURE ENLIVE PO LIQD
237.0000 mL | Freq: Two times a day (BID) | ORAL | Status: DC
  Administered 2020-09-26 – 2020-09-27 (×2): 237 mL via ORAL

## 2020-09-26 NOTE — Consult Note (Signed)
   [facility] CM Inpatient Consult   09/26/2020  [name] 1945-11-16 [id number]  [place] [facility] [ACO] Patient: [name]  Patient is currently active with [facility] for chronic disease management services.  Patient has been engaged by a [facility]..  Our community based plan of care has focused on disease management and community resource support.    Plan: Will follow progress with Inpatient Transition Of Care [TOC] team member to make aware that [facility] [facility] following.   Of note, [facility] Care Management services does not replace or interfere with any services that are needed or arranged by inpatient [facility] care management team.  For additional questions or referrals please contact:  Natividad Brood, RN BSN [facility] Liaison  [phone] business mobile phone Toll free office [phone]  Fax number: [phone] [name]

## 2020-09-26 NOTE — Progress Notes (Signed)
Initial Nutrition Assessment  DOCUMENTATION CODES:   Non-severe (moderate) malnutrition in context of chronic illness  INTERVENTION:  -Recommend liberalizing diet to regular -Ensure Enlive po BID, each supplement provides 350 kcal and 20 grams of protein -MVI with minerals daily  NUTRITION DIAGNOSIS:   Moderate Malnutrition related to chronic illness as evidenced by mild fat depletion, mild muscle depletion, percent weight loss.  GOAL:   Patient will meet greater than or equal to 90% of their needs  MONITOR:   PO intake, Supplement acceptance, Labs, Weight trends, I & O's  REASON FOR ASSESSMENT:   Malnutrition Screening Tool    ASSESSMENT:   Pt with a PMH significant for DM type 2, HTN, CKD stage III, grade 1 diastolic dysfunction, Crohn's disease, R hemicolectomy, chronic pain, and breast cancer in remission admitted with ARF superimposed on stage 4 CKD. Pt recently admitted 6/18-6/25 w/ N/V and underwent EGD which showed diffuse mildly erythematous mucosa in the stomach and focal mucosal hemorrhages were present in addition to a hiatal hernia. Biopsy showed mild chronic gastritis and reactive gastropathy.  Nephrology following, IVF discontinued today. PO intake being encouraged.   Discussed pt with RN.  Pt does not report issues with appetite at this time and states that she is feeling well, but reports that she did struggle with nausea and poor appetite after being discharged last month up until 2 days prior to this admission when she reports her nausea resolved completely. Pt states she had very poor po during that period of having little appetite. Pt agreeable to use of oral nutrition supplements at this time.   PO intake: 50-100% x 6 recorded meals (70% average meal intake)   Per weight readings, pt weighed 82.4 kg on 07/01/20 and now weighs 71.2 kg. This indicates a clinically significant 14% weight loss over 3 months.   UOP: 372m documented x24 hours  Medications:  pepcid, SSI, creon, protonix, carafate Labs: Cr 2.92 (H, trending down) CBGs [phone] NUTRITION - FOCUSED PHYSICAL EXAM:  Flowsheet Row Most Recent Value  Orbital Region Mild depletion  Upper Arm Region Moderate depletion  Thoracic and Lumbar Region Mild depletion  Buccal Region Mild depletion  Temple Region Mild depletion  Clavicle Bone Region Mild depletion  Clavicle and Acromion Bone Region Mild depletion  Scapular Bone Region Mild depletion  Dorsal Hand Mild depletion  Patellar Region No depletion  Anterior Thigh Region Mild depletion  Posterior Calf Region Mild depletion  Edema (RD Assessment) None  Hair Reviewed  Eyes Reviewed  Mouth Reviewed  Skin Reviewed  Nails Reviewed       Diet Order:   Diet Order             Diet heart healthy/carb modified Room service appropriate? Yes; Fluid consistency: Thin  Diet effective now                   EDUCATION NEEDS:   No education needs have been identified at this time  Skin:  Skin Assessment: Reviewed RN Assessment  Last BM:  7/9  Height:   Ht Readings from Last 1 Encounters:  09/24/20 5' 4"  (1.626 m)    Weight:   Wt Readings from Last 10 Encounters:  09/26/20 71.2 kg  09/08/20 69.7 kg  07/29/20 80.6 kg  07/24/20 80.7 kg  07/19/20 88.5 kg  07/14/20 81.6 kg  07/01/20 82.4 kg  06/21/20 82.1 kg  05/23/20 78.2 kg  02/01/20 81.4 kg   BMI:  Body mass index is 26.94 kg/m.  Estimated Nutritional Needs:   Kcal:  1800-2000  Protein:  90-100g  Fluid:  1.8L    Katie Ina, MS, RD, LDN (she/her/hers) RD pager number and weekend/on-call pager number located in [place].

## 2020-09-26 NOTE — Progress Notes (Signed)
[facility] Progress Note   Subjective:   Feeling well today.  Appetite good, no orthopnea.  Some edema in legs.  No uremic symptoms.  UOP 340m yesterday.    Objective Vitals:   09/25/20 1700 09/25/20 2049 09/26/20 0701 09/26/20 0857  BP: 135/62 (!) 128/58 135/81 (!) 137/59  Pulse: 75 76 80 78  Resp: 19 14 14 16   Temp: 98.2 F (36.8 C) 99.5 F (37.5 C) 98.4 F (36.9 C) 98.5 F (36.9 C)  TempSrc: Oral Oral Oral Oral  SpO2: 94% 95% 91% 100%  Weight:   71.2 kg   Height:       Physical Exam General: lying flat in bed comfortably Heart: RRR, no rub Lungs: clear to bases, normal WOB Abdomen: soft Extremities: trace ankle and tibial edema Neuro: conversant  Additional Objective Labs: Basic Metabolic Panel: Recent Labs  Lab 09/23/20 1839 09/24/20 0500 09/24/20 1605 09/25/20 0020 09/25/20 1231  NA 136   < > 138 136 135  K 4.6   < > 3.1* 3.1* 3.4*  CL 106   < > 107 102 97*  CO2 18*   < > 23 25 27   GLUCOSE 180*   < > 110* 132* 108*  BUN 66*   < > 57* 52* 47*  CREATININE 4.81*   < > 3.46* 3.38* 3.09*  CALCIUM 10.0   < > 8.9 8.7* 9.0  PHOS 1.5*  --   --   --   --    < > = values in this interval not displayed.   Liver Function Tests: Recent Labs  Lab 09/24/20 1605 09/25/20 0020 09/25/20 1231  AST 187* 153* 107*  ALT 207* 183* 173*  ALKPHOS 92 88 95  BILITOT 0.7 0.8 0.7  PROT 5.8* 5.5* 6.2*  ALBUMIN 2.5* 2.5* 2.6*   No results for input(s): LIPASE, AMYLASE in the last 168 hours. CBC: Recent Labs  Lab 09/23/20 1525 09/23/20 1839 09/24/20 0500  WBC 9.1 11.5* 8.3  NEUTROABS 6.7 8.7*  --   HGB 10.8* 11.1* 10.1*  HCT 33.9* 36.0 31.5*  MCV 84.1 86.3 84.7  PLT 292 316 239   Blood Culture    Component Value Date/Time   SDES  09/03/2020 2006    URINE, CLEAN CATCH Performed at [facility] [address] [place] [address]   [facility] 09/[facility]    NONE Performed at [facility]  [address] [place] [address]   CULT >=100,000 COLONIES/mL YEAST (A) 09/03/2020 2006   REPTSTATUS 09/07/2020 FINAL 09/03/2020 2006    Cardiac Enzymes: No results for input(s): CKTOTAL, CKMB, CKMBINDEX, TROPONINI in the last 168 hours. CBG: Recent Labs  Lab 09/25/20 1126 09/25/20 1658 09/25/20 1701 09/25/20 2052 09/26/20 0735  GLUCAP 96 187* 178* 155* 108*   Iron Studies: No results for input(s): IRON, TIBC, TRANSFERRIN, FERRITIN in the last 72 hours. @lablastinr3 @ Studies/Results: No results found. Medications:   sodium bicarbonate (isotonic) infusion in sterile water 75 mL/hr at 09/26/20 0907    amLODipine  10 mg Oral Daily   apixaban  5 mg Oral BID   budesonide  3 mg Oral Daily   famotidine  20 mg Oral QHS   FLUoxetine  20 mg Oral QHS   hydrALAZINE  100 mg Oral BID   insulin aspart  0-9 Units Subcutaneous TID WC   isosorbide mononitrate  15 mg Oral Daily   lipase/protease/amylase  36,000 Units Oral TID WC   melatonin  10 mg Oral QHS   nebivolol  10 mg Oral Daily   pantoprazole  40 mg Oral Daily   pregabalin  25 mg Oral BID   sucralfate  1 g Oral TID WC & HS    Assessment/Recommendations:   AKI on CKD 4: Baseline as good as 1.5-1.7 recently.   AKI secondary to prerenal injury: Nausea/vomiting, decreased p.o. intake in the context of torsemide use. Renal US without obstruction.  UA bland. Renal function improving with Cr 2.92 from 3.1 yesterday -Stopped IV fluids today.  Encouraged her to eat and hydrate to thirst -Continue to hold torsemide but will likely resume tomorrow -Continue to monitor daily Cr, Dose meds for GFR<15 -Monitor Daily I/Os, Daily weight -Maintain MAP>65 for optimal renal perfusion. -Agree with holding ACE-I, avoid further nephrotoxins including NSAIDS, Morphine.  Unless absolutely necessary, avoid CT with contrast and/or MRI with gadolinium.      Diastolic CHF: d/c fluids, will continue to hold torsemide for now but likely will  resume tomorrow.    Hypertension: -BP 130s this AM on home meds   Normocytic anemia, anemia of chronic kidney disease: -Transfuse for Hgb<7 g/dL -Hemoglobin okay, monitor for now   Diabetes Mellitus Type 2 with Hyperglycemia -Per primary   Elevated LFTs -per primary.  Statin and allopurinol on hold   Dispo: if continued improvement tomorrow and she's feeling well I think she's may be able to d/c.  She sees Dr. Hollie Salk at [facility] and will make sure close f/u arranged.   Jannifer Hick MD 09/26/2020, 10:27 AM  [facility] Pager: [phone]

## 2020-09-26 NOTE — Plan of Care (Signed)
  Problem: Education: Goal: Knowledge of General Education information will improve Description: Including pain rating scale, medication(s)/side effects and non-pharmacologic comfort measures Outcome: Progressing   Problem: Health Behavior/Discharge Planning: Goal: Ability to manage health-related needs will improve Outcome: Progressing   Problem: Clinical Measurements: Goal: Cardiovascular complication will be avoided Outcome: Progressing   Problem: Nutrition: Goal: Adequate nutrition will be maintained Outcome: Progressing   Problem: Skin Integrity: Goal: Risk for impaired skin integrity will decrease Outcome: Progressing

## 2020-09-26 NOTE — Progress Notes (Addendum)
PROGRESS NOTE    [name]   VZC:[id number]  DOB: 12-13-1945  DOA: 09/23/2020 PCP: Seward Carol, MD   Brief Narrative:  [name] is a 75 year old female with diabetes mellitus type 2, hypertension, CKD stage III, grade 1 diastolic dysfunction, Crohn's disease, right hemicolectomy chronic pain, breast cancer in remission who presented to the hospital for abnormal lab work.  The patient was admitted from 6/18-6/25 with nausea and vomiting and underwent an EGD which showed Diffuse mildly erythematous mucosa was found in the entire examined stomach. Focal mucosal hemorrhages were present.  2 cm hiatal hernia.  Bile was also noted in the gastric antrum Biopsy showed mild chronic gastritis and reactive gastropathy.  H. pylori was negative.  Admission on 5/4-5/8 was for fluid overload-she was discharged on torsemide 40 mg daily  The patient states that she continued to be nauseated after discharge and had poor oral intake up until 2 days ago when her nausea resolved completely and her oral intake improved.  Blood work done at her oncologist office was found to have a creatinine of 4.9, AST 325.  She was at the oncologist office to receive an iron transfusion however this was not done and she was asked to go to the ED.  In the ED: BUN 66, creatinine 4.81, AST 10 and ALT 290-total bili 0.8    Subjective: She has no complaints today.   Assessment & Plan:   Principal Problem:   Acute renal failure superimposed on stage CKD 3B Metabolic acidosis related to AKI - Follows with Dr Hollie Salk [facility]) -Sent to the hospital for AKI-creatinine was 4.91 in the ED - Baseline creatinine is about 1.5-1.7 -Renal ultrasound reveals bilateral simple cysts, bladder distention appears normal - Creatinine has improved to 3.93 - Continue to hold Demadex which had been reduced from 40 mg to 20 mg when she was discharged on 6/25 - Cr steadily is improving- nephrology would like to  monitor her tonight, dc fluids and check Cr tomorrow- recommendations for Demadex will be made prior to dc  Active Problems: Transaminitis -AST 325> 410>> subsequently improving  ALT 268> 290 subsequently improvingg - Liver gallbladder ultrasound reveals a small hepatic cyst -She is on allopurinol and Periactin which are on hold - holding Crestor for now    History of prolonged QT right bundle branch block - QTc 468 ton 7/9  Chronic gastritis, hiatal hernia, Crohn's disease, right hemicolectomy -Presented with nausea and vomiting in June-EGD on 09/08/20 -Per GIs note on 6/24, there was a suspicion of bile reflux gastritis - Continue Pepcid, Creon, budesonide (oral), sucralfate  Hypokalemia - replaced  Chronic diastolic heart failure - After her admission for GI issues, Demadex was decreased from 40 to 20 mg - Continue to hold for today and reassess tomorrow    Breast cancer of lower-inner quadrant of right female breast ([place]) -In remission    Diabetic neuropathy associated with type 2 diabetes mellitus ([place]) -Holding linagliptin - continue Lyrica - Continue sensitive sliding scale insulin    Essential hypertension -Continue hydralazine, isosorbide mononitrate, Bystolic, amlodipine  Right peroneal vein DVT - Discovered on 06/29/2020 on venous duplex - Started on apixaban for 3 months-stop date would be roughly 09/29/2020  Depression - On fluoxetine  Gout - Allopurinol on hold due to renal function     Time spent in minutes: 35 DVT prophylaxis: Eliquis Code Status: Full code Family Communication: Daughter at bedside  Level of Care: Level of care: Med-Surg Disposition Plan:  Status  is: Inpatient  Remains inpatient appropriate because:IV treatments appropriate due to intensity of illness or inability to take PO  Dispo: The patient is from: Home              Anticipated d/c is to: Home              Patient currently is not medically stable to d/c.   Difficult to  place patient No Consultants:  Nephrology Procedures:  None Antimicrobials:  Anti-infectives (From admission, onward)    None        Objective: Vitals:   09/25/20 1700 09/25/20 2049 09/26/20 0701 09/26/20 0857  BP: 135/62 (!) 128/58 135/81 (!) 137/59  Pulse: 75 76 80 78  Resp: 19 14 14 16   Temp: 98.2 F (36.8 C) 99.5 F (37.5 C) 98.4 F (36.9 C) 98.5 F (36.9 C)  TempSrc: Oral Oral Oral Oral  SpO2: 94% 95% 91% 100%  Weight:   71.2 kg   Height:        Intake/Output Summary (Last 24 hours) at 09/26/2020 1322 Last data filed at 09/26/2020 0900 Gross per 24 hour  Intake 1697.85 ml  Output 125 ml  Net 1572.85 ml    Filed Weights   09/24/20 0900 09/26/20 0701  Weight: 67.3 kg 71.2 kg    Examination: General exam: Appears comfortable  HEENT: PERRLA, oral mucosa moist, no sclera icterus or thrush Respiratory system: Clear to auscultation. Respiratory effort normal. Cardiovascular system: S1 & S2 heard, regular rate and rhythm Gastrointestinal system: Abdomen soft, non-tender, nondistended. Normal bowel sounds   Central nervous system: Alert and oriented. No focal neurological deficits. Extremities: No cyanosis, clubbing or edema Skin: No rashes or ulcers Psychiatry:  Mood & affect appropriate.        Data Reviewed: I have personally reviewed following labs and imaging studies  CBC: Recent Labs  Lab 09/23/20 1525 09/23/20 1839 09/24/20 0500  WBC 9.1 11.5* 8.3  NEUTROABS 6.7 8.7*  --   HGB 10.8* 11.1* 10.1*  HCT 33.9* 36.0 31.5*  MCV 84.1 86.3 84.7  PLT 292 316 569    Basic Metabolic Panel: Recent Labs  Lab 09/23/20 1839 09/24/20 0500 09/24/20 1605 09/25/20 0020 09/25/20 1231 09/26/20 0043 09/26/20 1053  NA 136 137 138 136 135  --  136  K 4.6 3.6 3.1* 3.1* 3.4*  --  4.0  CL 106 108 107 102 97*  --  97*  CO2 18* 21* 23 25 27   --  31  GLUCOSE 180* 95 110* 132* 108*  --  121*  BUN 66* 57* 57* 52* 47*  --  40*  CREATININE 4.81* 3.93* 3.46*  3.38* 3.09*  --  2.92*  CALCIUM 10.0 9.2 8.9 8.7* 9.0  --  8.6*  MG 1.4*  --   --  1.5*  --  1.9  --   PHOS 1.5*  --   --   --   --   --   --     GFR: Estimated Creatinine Clearance: 16.6 mL/min (A) (by C-G formula based on SCr of 2.92 mg/dL (H)). Liver Function Tests: Recent Labs  Lab 09/23/20 1839 09/24/20 0500 09/24/20 1605 09/25/20 0020 09/25/20 1231  AST 410* 255* 187* 153* 107*  ALT 290* 234* 207* 183* 173*  ALKPHOS 107 93 92 88 95  BILITOT 0.8 0.6 0.7 0.8 0.7  PROT 7.3 6.2* 5.8* 5.5* 6.2*  ALBUMIN 3.1* 2.7* 2.5* 2.5* 2.6*    No results for input(s): LIPASE, AMYLASE in the last  168 hours. No results for input(s): AMMONIA in the last 168 hours. Coagulation Profile: No results for input(s): INR, PROTIME in the last 168 hours. Cardiac Enzymes: No results for input(s): CKTOTAL, CKMB, CKMBINDEX, TROPONINI in the last 168 hours. BNP (last 3 results) No results for input(s): PROBNP in the last 8760 hours. HbA1C: No results for input(s): HGBA1C in the last 72 hours. CBG: Recent Labs  Lab 09/25/20 1658 09/25/20 1701 09/25/20 2052 09/26/20 0735 09/26/20 1137  GLUCAP 187* 178* 155* 108* 137*    Lipid Profile: No results for input(s): CHOL, HDL, LDLCALC, TRIG, CHOLHDL, LDLDIRECT in the last 72 hours. Thyroid Function Tests: No results for input(s): TSH, T4TOTAL, FREET4, T3FREE, THYROIDAB in the last 72 hours. Anemia Panel: No results for input(s): VITAMINB12, FOLATE, FERRITIN, TIBC, IRON, RETICCTPCT in the last 72 hours. Urine analysis:    Component Value Date/Time   COLORURINE YELLOW 09/26/2020 1023   APPEARANCEUR HAZY (A) 09/26/2020 1023   APPEARANCEUR Clear 08/22/2018 1620   LABSPEC 1.010 09/26/2020 1023   LABSPEC 1.020 03/03/2014 1341   PHURINE 7.0 09/26/2020 1023   GLUCOSEU NEGATIVE 09/26/2020 1023   GLUCOSEU Negative 03/03/2014 1341   HGBUR NEGATIVE 09/26/2020 1023   BILIRUBINUR NEGATIVE 09/26/2020 1023   BILIRUBINUR Negative 08/22/2018 1620    BILIRUBINUR Negative 03/03/2014 1341   KETONESUR NEGATIVE 09/26/2020 1023   PROTEINUR NEGATIVE 09/26/2020 1023   UROBILINOGEN 0.2 03/03/2014 1341   NITRITE NEGATIVE 09/26/2020 1023   LEUKOCYTESUR NEGATIVE 09/26/2020 1023   LEUKOCYTESUR Negative 03/03/2014 1341   Sepsis Labs: @LABRCNTIP (procalcitonin:4,lacticidven:4) ) Recent Results (from the past 240 hour(s))  SARS CORONAVIRUS 2 (TAT 6-24 HRS) Nasopharyngeal Nasopharyngeal Swab     Status: None   Collection Time: 09/23/20 10:10 PM   Specimen: Nasopharyngeal Swab  Result Value Ref Range Status   SARS Coronavirus 2 NEGATIVE NEGATIVE Final    Comment: (NOTE) SARS-CoV-2 target nucleic acids are NOT DETECTED.  The SARS-CoV-2 RNA is generally detectable in upper and lower respiratory specimens during the acute phase of infection. Negative results do not preclude SARS-CoV-2 infection, do not rule out co-infections with other pathogens, and should not be used as the sole basis for treatment or other patient management decisions. Negative results must be combined with clinical observations, patient history, and epidemiological information. The expected result is Negative.  Fact Sheet for Patients: SugarRoll.be  Fact Sheet for Healthcare Providers: https://www.woods-mathews.com/  This test is not yet approved or cleared by the [place] FDA and  has been authorized for detection and/or diagnosis of SARS-CoV-2 by FDA under an Emergency Use Authorization (EUA). This EUA will remain  in effect (meaning this test can be used) for the duration of the COVID-19 declaration under Se ction 564(b)(1) of the Act, 21 U.S.C. section 360bbb-3(b)(1), unless the authorization is terminated or revoked sooner.  Performed at [facility], [place] [address] [place], [place] [address]          Radiology Studies: No results found.    Scheduled Meds:  amLODipine  10 mg Oral Daily   apixaban   5 mg Oral BID   budesonide  3 mg Oral Daily   famotidine  20 mg Oral QHS   FLUoxetine  20 mg Oral QHS   hydrALAZINE  100 mg Oral BID   insulin aspart  0-9 Units Subcutaneous TID WC   isosorbide mononitrate  15 mg Oral Daily   lipase/protease/amylase  36,000 Units Oral TID WC   melatonin  10 mg Oral QHS   nebivolol  10 mg Oral Daily   pantoprazole  40 mg Oral Daily   pregabalin  25 mg Oral BID   sucralfate  1 g Oral TID WC & HS   Continuous Infusions:     LOS: 3 days      Debbe Odea, MD [facility] Pager: [email or web address] 09/26/2020, 1:22 PM

## 2020-09-27 DIAGNOSIS — M1A3710 Chronic gout due to renal impairment, right ankle and foot, without tophus (tophi): Secondary | ICD-10-CM

## 2020-09-27 DIAGNOSIS — N2581 Secondary hyperparathyroidism of renal origin: Secondary | ICD-10-CM

## 2020-09-27 DIAGNOSIS — N179 Acute kidney failure, unspecified: Secondary | ICD-10-CM | POA: Diagnosis not present

## 2020-09-27 DIAGNOSIS — E669 Obesity, unspecified: Secondary | ICD-10-CM

## 2020-09-27 DIAGNOSIS — E1169 Type 2 diabetes mellitus with other specified complication: Secondary | ICD-10-CM

## 2020-09-27 DIAGNOSIS — E440 Moderate protein-calorie malnutrition: Secondary | ICD-10-CM

## 2020-09-27 DIAGNOSIS — N184 Chronic kidney disease, stage 4 (severe): Secondary | ICD-10-CM | POA: Diagnosis not present

## 2020-09-27 DIAGNOSIS — E1142 Type 2 diabetes mellitus with diabetic polyneuropathy: Secondary | ICD-10-CM | POA: Diagnosis not present

## 2020-09-27 DIAGNOSIS — I5032 Chronic diastolic (congestive) heart failure: Secondary | ICD-10-CM | POA: Diagnosis not present

## 2020-09-27 LAB — RENAL FUNCTION PANEL
Albumin: 2.2 g/dL — ABNORMAL LOW (ref 3.5–5.0)
Anion gap: 5 (ref 5–15)
BUN: 41 mg/dL — ABNORMAL HIGH (ref 8–23)
CO2: 34 mmol/L — ABNORMAL HIGH (ref 22–32)
Calcium: 8.5 mg/dL — ABNORMAL LOW (ref 8.9–10.3)
Chloride: 98 mmol/L (ref 98–111)
Creatinine, Ser: 3.04 mg/dL — ABNORMAL HIGH (ref 0.44–1.00)
GFR, Estimated: 16 mL/min — ABNORMAL LOW (ref >60)
Glucose, Bld: 140 mg/dL — ABNORMAL HIGH (ref 70–99)
Phosphorus: 1.4 mg/dL — ABNORMAL LOW (ref 2.5–4.6)
Potassium: 4.1 mmol/L (ref 3.5–5.1)
Sodium: 137 mmol/L (ref 135–145)

## 2020-09-27 LAB — CBC
HCT: 28.8 % — ABNORMAL LOW (ref 36.0–46.0)
Hemoglobin: 9 g/dL — ABNORMAL LOW (ref 12.0–15.0)
MCH: 26.9 pg (ref 26.0–34.0)
MCHC: 31.3 g/dL (ref 30.0–36.0)
MCV: 86 fL (ref 80.0–100.0)
Platelets: 226 10*3/uL (ref 150–400)
RBC: 3.35 MIL/uL — ABNORMAL LOW (ref 3.87–5.11)
RDW: 14.6 % (ref 11.5–15.5)
WBC: 7.3 10*3/uL (ref 4.0–10.5)
nRBC: 0.4 % — ABNORMAL HIGH (ref 0.0–0.2)

## 2020-09-27 LAB — GLUCOSE, CAPILLARY
Glucose-Capillary: 143 mg/dL — ABNORMAL HIGH (ref 70–99)
Glucose-Capillary: 110 mg/dL — ABNORMAL HIGH (ref 70–99)

## 2020-09-27 MED ORDER — FAMOTIDINE 20 MG PO TABS: 20.0000 mg | ORAL_TABLET | Freq: Every day | ORAL | Status: DC

## 2020-09-27 NOTE — Discharge Summary (Signed)
Physician Discharge Summary  [name] FYT:[id number] DOB: 28-Feb-1946 DOA: 09/23/2020  PCP: Seward Carol, MD  Admit date: 09/23/2020 Discharge date: 09/27/2020  Admitted From: home  Disposition:  home   Recommendations for Outpatient Follow-up:  Please f/u [facility] (renal function and hepatic function) Allopurinal, Peractin, Crestor on hold- renal dosing on [place]:  none  Discharge Condition:  stable   CODE STATUS:  Full code   Consultations: nephrology  Procedures/Studies: none   Discharge Diagnoses:  Principal Problem:   Acute renal failure superimposed on stage 4 chronic kidney disease ([place]) Active Problems:   Transaminitis   Anemia in chronic kidney disease   Diabetes mellitus type 2 in obese ([place])   Breast cancer of lower-inner quadrant of right female breast ([place])   Diabetic neuropathy associated with type 2 diabetes mellitus ([place])   Gout   Hyperparathyroidism, secondary renal ([place])   Essential hypertension   Chronic diastolic CHF (congestive heart failure) ([place])   Malnutrition of moderate degree     Brief Summary: Brief Narrative:  [name] is a 74 year old female with diabetes mellitus type 2, hypertension, CKD stage III, grade 1 diastolic dysfunction, Crohn's disease, right hemicolectomy chronic pain, breast cancer in remission who presented to the hospital for abnormal lab work.    She had an admission on 5/4-5/8 was for fluid overload-she was discharged on torsemide 40 mg daily  The patient was admitted from 6/18-6/25 with nausea and vomiting and underwent an EGD which showed Diffuse mildly erythematous mucosa was found in the entire examined stomach. Focal mucosal hemorrhages were present.  2 cm hiatal hernia.  Bile was also noted in the gastric antrum Biopsy showed mild chronic gastritis and reactive gastropathy.  H. pylori was negative.  The patient states that she continued to be nauseated after discharge and had poor oral  intake up until 2 days ago when her nausea resolved completely and her oral intake improved.  Blood work done at her oncologist office was found to have a creatinine of 4.9, AST 325.  She was at the oncologist office to receive an iron transfusion however this was not done and she was asked to go to the ED.  In the ED: BUN 66, creatinine 4.81, AST 10 and ALT 290-total bili 0.8       Hospital Course:  Principal Problem:   Acute renal failure superimposed on stage CKD 3B Metabolic acidosis related to AKI - Follows with Dr Hollie Salk [facility]) -Sent to the hospital for AKI-creatinine was 4.91 in the ED - Baseline creatinine is about 1.5-1.7 -Renal ultrasound reveals bilateral simple cysts, bladder distention appears normal - Sodium Bicarb infusion started 7/8 and stopped on 7/11 -  Demadex was held during this hospital stay - nephrology consult requested - Cr is improved but has stalled and is 2.9-3.0 now - Dr Rosalio Loud is ok with discharge today- she has emailed her office to arrange f/u in 1-2 wks, recommends resuming Demadex with instructions to hold if not drinking-    Active Problems: Transaminitis -AST 325> 410>> subsequently improving  ALT 268> 290 subsequently improvingg - Liver gallbladder ultrasound reveals a small hepatic cyst -Periactin and Allopurinol may be the cause - holding Crestor for now  (until LFTs normalize)   Chronic gastritis, hiatal hernia, Crohn's disease, right hemicolectomy -Presented with nausea and vomiting in [place] on 09/08/20 -Per GIs note on 6/24, there was a suspicion of bile reflux gastritis - Continue Pepcid, Creon, Protonix, budesonide (oral), sucralfate   Hypokalemia -  replaced   History of prolonged QT right bundle branch block - QTc 468 on EKG on 7/9   Chronic diastolic heart failure - Essential hypertension - After her admission for GI issues, Demadex was decreased from 40 to 20 mg - resuming per nephro recommendations - cont  Hydralazine, Imdur, Bystolic, Amlodipine     Breast cancer of lower-inner quadrant of right female breast ([facility]) -In remission     Diabetic neuropathy associated with type 2 diabetes mellitus ([facility]) -Holding linagliptin in hospital - can resume tomorrow- continue Lyrica    Right peroneal vein DVT - Discovered on 06/29/2020 on venous duplex - Started on apixaban for 3 months-stop date would be roughly 09/29/2020   Depression - recently started on fluoxetine   Gout - Allopurinol to resume tomorrow       Discharge Exam: Vitals:   09/27/20 0843 09/27/20 0900  BP: 126/66 (!) 128/53  Pulse: 75 76  Resp:  18  Temp:  98.2 F (36.8 C)  SpO2: 99% 96%   Vitals:   09/26/20 2019 09/27/20 0522 09/27/20 0843 09/27/20 0900  BP: (!) 112/53 133/62 126/66 (!) 128/53  Pulse: 73 73 75 76  Resp: 20 20  18   Temp: 98.1 F (36.7 C) 98.9 F (37.2 C)  98.2 F (36.8 C)  TempSrc: Oral Oral  Oral  SpO2: 97% 99% 99% 96%  Weight:      Height:        General: Pt is alert, awake, not in acute distress Cardiovascular: RRR, S1/S2 +, no rubs, no gallops Respiratory: CTA bilaterally, no wheezing, no rhonchi Abdominal: Soft, NT, ND, bowel sounds + Extremities: no edema, no cyanosis   Discharge Instructions  Discharge Instructions     Diet - low sodium heart healthy   Complete by: As directed    Diet Carb Modified   Complete by: As directed    Increase activity slowly   Complete by: As directed       Allergies as of 09/27/2020       Reactions   Percocet [oxycodone-acetaminophen] Other (See Comments)   headaches   Diazepam Other (See Comments)   REACTION: Agitation Other reaction(s): agitation   Haloperidol Lactate Nausea And Vomiting   Lorazepam Nausea Only   Other reaction(s): nausea   Morphine Sulfate Other (See Comments)   REACTION: Agitation   Propoxyphene Hcl Nausea And Vomiting   Tramadol Hcl Nausea And Vomiting   Anesthetics, Halogenated    Per patient's daughter, she can  not tolerate anesthetics.    Etomidate    Heart stopped during anesthesia   Fentanyl    Heart stopped during anesthesia   Haloperidol    Other reaction(s): nausea & vomitin   Morphine Sulfate    Other reaction(s): nausea   Oxycodone-acetaminophen    Other reaction(s): headaches   Propoxyphene    Other reaction(s): nausea & vomiting   Tramadol Hcl    Other reaction(s): nausea & vomitin Other reaction(s): nausea & vomitin   Versed [midazolam]    Heart stopped during anesthesia        Medication List     STOP taking these medications    allopurinol 100 MG tablet Commonly known as: ZYLOPRIM   cyproheptadine 4 MG tablet Commonly known as: PERIACTIN   rosuvastatin 5 MG tablet Commonly known as: CRESTOR       TAKE these medications    acetaminophen 500 MG tablet Commonly known as: TYLENOL Take 1,000 mg by mouth every 6 (six) hours as needed for  moderate pain or headache.   amLODipine 10 MG tablet Commonly known as: NORVASC Take 1 tablet (10 mg total) by mouth daily.   apixaban 5 MG Tabs tablet Commonly known as: ELIQUIS Take 1 tablet (5 mg total) by mouth 2 (two) times daily. Start after you finish the starter pack   B-12 COMPLIANCE INJECTION IJ Inject 1 vial as directed every 30 (thirty) days.   budesonide 3 MG 24 hr capsule Commonly known as: ENTOCORT EC Take 1 capsule by mouth once daily   ergocalciferol 1.25 MG (50000 UT) capsule Commonly known as: VITAMIN D2 Take 1 capsule (50,000 Units total) by mouth once a week. What changed: when to take this   famotidine 20 MG tablet Commonly known as: PEPCID Take 1 tablet (20 mg total) by mouth at bedtime. What changed:  medication strength how much to take   FLUoxetine 20 MG capsule Commonly known as: PROZAC Take 20 mg by mouth at bedtime.   hydrALAZINE 100 MG tablet Commonly known as: APRESOLINE TAKE 1 TABLET BY MOUTH 3  TIMES DAILY What changed: when to take this   isosorbide mononitrate 30 MG 24  hr tablet Commonly known as: IMDUR TAKE 1/2 (ONE-HALF) TABLET BY MOUTH ONCE DAILY (PLEASE  SCHEDULE  AN  APPOINTMENT  FOR  FURTHER  REFILLS) What changed: See the new instructions.   linagliptin 5 MG Tabs tablet Commonly known as: TRADJENTA Take 5 mg by mouth daily.   lipase/protease/amylase 36000 UNITS Cpep capsule Commonly known as: CREON Take 36,000 Units by mouth 3 (three) times daily with meals.   loperamide 2 MG tablet Commonly known as: IMODIUM A-D Take 4 mg by mouth 4 (four) times daily as needed for diarrhea or loose stools.   melatonin 5 MG Tabs Take 10 mg by mouth at bedtime.   nebivolol 10 MG tablet Commonly known as: BYSTOLIC Take 1 tablet (10 mg total) by mouth daily.   nitroGLYCERIN 0.4 MG SL tablet Commonly known as: NITROSTAT DISSOLVE 1 TABLET UNDER THE TONGUE EVERY 5 MINUTES AS  NEEDED FOR CHEST PAIN. MAX  OF 3 TABLETS IN 15 MINUTES. CALL 911 IF PAIN PERSISTS. What changed: See the new instructions.   ondansetron 4 MG disintegrating tablet Commonly known as: Zofran ODT Take 1 tablet (4 mg total) by mouth every 8 (eight) hours as needed for nausea or vomiting.   pantoprazole 40 MG tablet Commonly known as: PROTONIX Take 1 tablet (40 mg total) by mouth daily.   pregabalin 25 MG capsule Commonly known as: Lyrica Take 1 capsule (25 mg total) by mouth 2 (two) times daily.   RETACRIT IJ Inject 1 vial as directed every 30 (thirty) days.   sucralfate 1 g tablet Commonly known as: CARAFATE Take 1 g by mouth 4 (four) times daily -  with meals and at bedtime. What changed: Another medication with the same name was removed. Continue taking this medication, and follow the directions you see here.   torsemide 20 MG tablet Commonly known as: DEMADEX Take 1 tablet (20 mg total) by mouth daily.        Allergies  Allergen Reactions   Percocet [Oxycodone-Acetaminophen] Other (See Comments)    headaches   Diazepam Other (See Comments)    REACTION:  Agitation Other reaction(s): agitation   Haloperidol Lactate Nausea And Vomiting   Lorazepam Nausea Only    Other reaction(s): nausea   Morphine Sulfate Other (See Comments)    REACTION: Agitation   Propoxyphene Hcl Nausea And Vomiting   Tramadol  Hcl Nausea And Vomiting   Anesthetics, Halogenated     Per patient's daughter, she can not tolerate anesthetics.    Etomidate     Heart stopped during anesthesia   Fentanyl     Heart stopped during anesthesia    Haloperidol     Other reaction(s): nausea & vomitin   Morphine Sulfate     Other reaction(s): nausea   Oxycodone-Acetaminophen     Other reaction(s): headaches   Propoxyphene     Other reaction(s): nausea & vomiting   Tramadol Hcl     Other reaction(s): nausea & vomitin Other reaction(s): nausea & vomitin   Versed [Midazolam]     Heart stopped during anesthesia      CT ABDOMEN PELVIS WO CONTRAST  Result Date: 09/03/2020 CLINICAL DATA:  Nausea and vomiting. EXAM: CT ABDOMEN AND PELVIS WITHOUT CONTRAST TECHNIQUE: Multidetector CT imaging of the abdomen and pelvis was performed following the standard protocol without IV contrast. COMPARISON:  August 17, 2020 FINDINGS: Lower chest: Small hiatal hernia. Postsurgical changes in the right breast. Hepatobiliary: No focal liver abnormality is seen. Status post cholecystectomy. No biliary dilatation. Pancreas: Unremarkable. No pancreatic ductal dilatation or surrounding inflammatory changes. Spleen: Normal in size without focal abnormality. Adrenals/Urinary Tract: Adrenal glands are unremarkable. Kidneys are normal, without renal calculi, focal lesion, or hydronephrosis. Bladder is unremarkable. Bilateral renal cysts. Stomach/Bowel: Stomach is within normal limits. Sequela of prior right ileocecal anastomosis. No evidence of bowel wall thickening, distention, or inflammatory changes. Vascular/Lymphatic: Aortic atherosclerosis. No enlarged abdominal or pelvic lymph nodes. Reproductive: Status  post hysterectomy. No adnexal masses. Other: No abdominal wall hernia or abnormality. No abdominopelvic ascites. Musculoskeletal: No acute or significant osseous findings. IMPRESSION: 1. No evidence of acute abnormalities within the abdomen or pelvis. 2. Small hiatal hernia. 3. Aortic atherosclerosis. Aortic Atherosclerosis (ICD10-I70.0). Electronically Signed   By: Fidela Salisbury M.D.   On: 09/03/2020 19:20   US RENAL  Result Date: 09/24/2020 CLINICAL DATA:  Acute renal failure EXAM: RENAL / URINARY TRACT ULTRASOUND COMPLETE COMPARISON:  09/03/2020, ultrasound from 07/20/2020 FINDINGS: Right Kidney: Renal measurements: 10.3 x 5.9 x 6.4 cm. = volume: 204 mL. Cysts are noted within the right kidney the largest of which measures 2.3 cm. No mass lesion or hydronephrosis is noted. Diffuse increased echogenicity is seen. Left Kidney: Renal measurements: 11.4 x 5.6 x 5.9 cm. = volume: 198 mL. Cysts are noted on the left the largest of which measures 2.3 cm. Generalized increased echogenicity is noted. Bladder: Appears normal for degree of bladder distention. Other: None. IMPRESSION: Bilateral simple renal cysts. Increased echogenicity consistent with medical renal disease. Electronically Signed   By: Inez Catalina M.D.   On: 09/24/2020 00:19   US Abdomen Limited RUQ (LIVER/GB)  Result Date: 09/24/2020 CLINICAL DATA:  Elevated LFTs EXAM: ULTRASOUND ABDOMEN LIMITED RIGHT UPPER QUADRANT COMPARISON:  CT from 09/03/2020 FINDINGS: Gallbladder: Surgically removed Common bile duct: Diameter: 5.2 mm. Liver: Small 8 mm cyst is noted in the left lobe of the liver. Within normal limits in parenchymal echogenicity. Portal vein is patent on color Doppler imaging with normal direction of blood flow towards the liver. Other: None. IMPRESSION: Small hepatic cyst. No other focal abnormality is noted. Electronically Signed   By: Inez Catalina M.D.   On: 09/24/2020 00:21     The results of significant diagnostics from this  hospitalization (including imaging, microbiology, ancillary and laboratory) are listed below for reference.     Microbiology: Recent Results (from the past 240 hour(s))  SARS CORONAVIRUS 2 (TAT 6-24 HRS) Nasopharyngeal Nasopharyngeal Swab     Status: None   Collection Time: 09/23/20 10:10 PM   Specimen: Nasopharyngeal Swab  Result Value Ref Range Status   SARS Coronavirus 2 NEGATIVE NEGATIVE Final    Comment: (NOTE) SARS-CoV-2 target nucleic acids are NOT DETECTED.  The SARS-CoV-2 RNA is generally detectable in upper and lower respiratory specimens during the acute phase of infection. Negative results do not preclude SARS-CoV-2 infection, do not rule out co-infections with other pathogens, and should not be used as the sole basis for treatment or other patient management decisions. Negative results must be combined with clinical observations, patient history, and epidemiological information. The expected result is Negative.  Fact Sheet for Patients: SugarRoll.be  Fact Sheet for Healthcare Providers: https://www.woods-mathews.com/  This test is not yet approved or cleared by the [place] FDA and  has been authorized for detection and/or diagnosis of SARS-CoV-2 by FDA under an Emergency Use Authorization (EUA). This EUA will remain  in effect (meaning this test can be used) for the duration of the COVID-19 declaration under Se ction 564(b)(1) of the Act, 21 U.S.C. section 360bbb-3(b)(1), unless the authorization is terminated or revoked sooner.  Performed at [facility], [place] [address], [place], [place] [address]      Labs: BNP (last 3 results) Recent Labs    06/28/20 2056 07/20/20 1530  BNP 59.9 301.6*   Basic Metabolic Panel: Recent Labs  Lab 09/23/20 1839 09/24/20 0500 09/24/20 1605 09/25/20 0020 09/25/20 1231 09/26/20 0043 09/26/20 1053 09/27/20 0616  NA 136   < > 138 136 135  --  136 137  K 4.6   < >  3.1* 3.1* 3.4*  --  4.0 4.1  CL 106   < > 107 102 97*  --  97* 98  CO2 18*   < > 23 25 27   --  31 34*  GLUCOSE 180*   < > 110* 132* 108*  --  121* 140*  BUN 66*   < > 57* 52* 47*  --  40* 41*  CREATININE 4.81*   < > 3.46* 3.38* 3.09*  --  2.92* 3.04*  CALCIUM 10.0   < > 8.9 8.7* 9.0  --  8.6* 8.5*  MG 1.4*  --   --  1.5*  --  1.9  --   --   PHOS 1.5*  --   --   --   --   --   --  1.4*   < > = values in this interval not displayed.   Liver Function Tests: Recent Labs  Lab 09/23/20 1839 09/24/20 0500 09/24/20 1605 09/25/20 0020 09/25/20 1231 09/27/20 0616  AST 410* 255* 187* 153* 107*  --   ALT 290* 234* 207* 183* 173*  --   ALKPHOS 107 93 92 88 95  --   BILITOT 0.8 0.6 0.7 0.8 0.7  --   PROT 7.3 6.2* 5.8* 5.5* 6.2*  --   ALBUMIN 3.1* 2.7* 2.5* 2.5* 2.6* 2.2*   No results for input(s): LIPASE, AMYLASE in the last 168 hours. No results for input(s): AMMONIA in the last 168 hours. CBC: Recent Labs  Lab 09/23/20 1525 09/23/20 1839 09/24/20 0500 09/27/20 0616  WBC 9.1 11.5* 8.3 7.3  NEUTROABS 6.7 8.7*  --   --   HGB 10.8* 11.1* 10.1* 9.0*  HCT 33.9* 36.0 31.5* 28.8*  MCV 84.1 86.3 84.7 86.0  PLT 292 316 239 226   Cardiac  Enzymes: No results for input(s): CKTOTAL, CKMB, CKMBINDEX, TROPONINI in the last 168 hours. BNP: Invalid input(s): POCBNP CBG: Recent Labs  Lab 09/26/20 1137 09/26/20 1655 09/26/20 2128 09/27/20 0649 09/27/20 1158  GLUCAP 137* 142* 153* 143* 110*   D-Dimer No results for input(s): DDIMER in the last 72 hours. Hgb A1c No results for input(s): HGBA1C in the last 72 hours. Lipid Profile No results for input(s): CHOL, HDL, LDLCALC, TRIG, CHOLHDL, LDLDIRECT in the last 72 hours. Thyroid function studies No results for input(s): TSH, T4TOTAL, T3FREE, THYROIDAB in the last 72 hours.  Invalid input(s): FREET3 Anemia work up No results for input(s): VITAMINB12, FOLATE, FERRITIN, TIBC, IRON, RETICCTPCT in the last 72 hours. Urinalysis     Component Value Date/Time   COLORURINE YELLOW 09/26/2020 1023   APPEARANCEUR HAZY (A) 09/26/2020 1023   APPEARANCEUR Clear 08/22/2018 1620   LABSPEC 1.010 09/26/2020 1023   LABSPEC 1.020 03/03/2014 1341   PHURINE 7.0 09/26/2020 1023   GLUCOSEU NEGATIVE 09/26/2020 1023   GLUCOSEU Negative 03/03/2014 1341   HGBUR NEGATIVE 09/26/2020 1023   BILIRUBINUR NEGATIVE 09/26/2020 1023   BILIRUBINUR Negative 08/22/2018 1620   BILIRUBINUR Negative 03/03/2014 1341   KETONESUR NEGATIVE 09/26/2020 1023   PROTEINUR NEGATIVE 09/26/2020 1023   UROBILINOGEN 0.2 03/03/2014 1341   NITRITE NEGATIVE 09/26/2020 1023   LEUKOCYTESUR NEGATIVE 09/26/2020 1023   LEUKOCYTESUR Negative 03/03/2014 1341   Sepsis Labs Invalid input(s): PROCALCITONIN,  WBC,  LACTICIDVEN Microbiology Recent Results (from the past 240 hour(s))  SARS CORONAVIRUS 2 (TAT 6-24 HRS) Nasopharyngeal Nasopharyngeal Swab     Status: None   Collection Time: 09/23/20 10:10 PM   Specimen: Nasopharyngeal Swab  Result Value Ref Range Status   SARS Coronavirus 2 NEGATIVE NEGATIVE Final    Comment: (NOTE) SARS-CoV-2 target nucleic acids are NOT DETECTED.  The SARS-CoV-2 RNA is generally detectable in upper and lower respiratory specimens during the acute phase of infection. Negative results do not preclude SARS-CoV-2 infection, do not rule out co-infections with other pathogens, and should not be used as the sole basis for treatment or other patient management decisions. Negative results must be combined with clinical observations, patient history, and epidemiological information. The expected result is Negative.  Fact Sheet for Patients: SugarRoll.be  Fact Sheet for Healthcare Providers: https://www.woods-mathews.com/  This test is not yet approved or cleared by the [place] FDA and  has been authorized for detection and/or diagnosis of SARS-CoV-2 by FDA under an Emergency Use Authorization  (EUA). This EUA will remain  in effect (meaning this test can be used) for the duration of the COVID-19 declaration under Se ction 564(b)(1) of the Act, 21 U.S.C. section 360bbb-3(b)(1), unless the authorization is terminated or revoked sooner.  Performed at [facility], [place] [address] [place], [place] [address]      Time coordinating discharge in minutes: 65  SIGNED:   Debbe Odea, MD  [facility] 09/27/2020, 1:06 PM

## 2020-09-27 NOTE — Care Management Important Message (Signed)
Important Message  Patient Details  Name: [name] MRN: [id number] Date of Birth: 1945/03/30   Medicare Important Message Given:  Yes     Barb Merino [place] 09/27/2020, 1:35 PM

## 2020-09-27 NOTE — Progress Notes (Signed)
[facility] Progress Note   Subjective:   Feeling well today - wishing to discharge.  Appetite good, no orthopnea.   No uremic symptoms.  UOP 267m yesterday, already 3027mtoday.  Objective Vitals:   09/26/20 2019 09/27/20 0522 09/27/20 0843 09/27/20 0900  BP: (!) 112/53 133/62 126/66 (!) 128/53  Pulse: 73 73 75 76  Resp: 20 20  18   Temp: 98.1 F (36.7 C) 98.9 F (37.2 C)  98.2 F (36.8 C)  TempSrc: Oral Oral  Oral  SpO2: 97% 99% 99% 96%  Weight:      Height:       Physical Exam General: sitting up eating robust lunch Heart: RRR, no rub Lungs: clear to bases, normal WOB Abdomen: soft Extremities: trace ankle and tibial edema Neuro: conversant  Additional Objective Labs: Basic Metabolic Panel: Recent Labs  Lab 09/23/20 1839 09/24/20 0500 09/25/20 1231 09/26/20 1053 09/27/20 0616  NA 136   < > 135 136 137  K 4.6   < > 3.4* 4.0 4.1  CL 106   < > 97* 97* 98  CO2 18*   < > 27 31 34*  GLUCOSE 180*   < > 108* 121* 140*  BUN 66*   < > 47* 40* 41*  CREATININE 4.81*   < > 3.09* 2.92* 3.04*  CALCIUM 10.0   < > 9.0 8.6* 8.5*  PHOS 1.5*  --   --   --  1.4*   < > = values in this interval not displayed.    Liver Function Tests: Recent Labs  Lab 09/24/20 1605 09/25/20 0020 09/25/20 1231 09/27/20 0616  AST 187* 153* 107*  --   ALT 207* 183* 173*  --   ALKPHOS 92 88 95  --   BILITOT 0.7 0.8 0.7  --   PROT 5.8* 5.5* 6.2*  --   ALBUMIN 2.5* 2.5* 2.6* 2.2*    No results for input(s): LIPASE, AMYLASE in the last 168 hours. CBC: Recent Labs  Lab 09/23/20 1525 09/23/20 1839 09/24/20 0500 09/27/20 0616  WBC 9.1 11.5* 8.3 7.3  NEUTROABS 6.7 8.7*  --   --   HGB 10.8* 11.1* 10.1* 9.0*  HCT 33.9* 36.0 31.5* 28.8*  MCV 84.1 86.3 84.7 86.0  PLT 292 316 239 226    Blood Culture    Component Value Date/Time   SDES  09/03/2020 2006    URINE, CLEAN CATCH [id number] [address] [place] [id number]   [facility]    NONE [facility]formed [id number] [id number]  CULT >=100,000 COLONIES/mL YEAST (A) 09/03/2020 2006   REPTSTATUS 09/07/2020 FINAL 09/03/2020 2006    Cardiac Enzymes: No results for input(s): CKTOTAL, CKMB, CKMBINDEX, TROPONINI in the last 168 hours. CBG: Recent Labs  Lab 09/26/20 0735 09/26/20 1137 09/26/20 1655 09/26/20 2128 09/27/20 0649  GLUCAP 108* 137* 142* 153* 143*    Iron Studies: No results for input(s): IRON, TIBC, TRANSFERRIN, FERRITIN in the last 72 hours. @lablastinr3 @ Studies/Results: No results found. Medications:    amLODipine  10 mg Oral Daily   apixaban  5 mg Oral BID   budesonide  3 mg Oral Daily   famotidine  20 mg Oral QHS   feeding supplement  237 mL Oral BID BM   FLUoxetine  20 mg Oral QHS   hydrALAZINE  100 mg Oral BID   insulin aspart  0-9 Units Subcutaneous TID  WC   isosorbide mononitrate  15 mg Oral Daily   lipase/protease/amylase  36,000 Units Oral TID WC   melatonin  10 mg Oral QHS   multivitamin with minerals  1 tablet Oral Daily   nebivolol  10 mg Oral Daily   pantoprazole  40 mg Oral Daily   pregabalin  25 mg Oral BID   sucralfate  1 g Oral TID WC & HS    Assessment/Recommendations:   AKI on CKD 4: Baseline as good as 1.5-1.7 recently.   AKI secondary to prerenal injury: Nausea/vomiting, decreased p.o. intake in the context of torsemide use. Renal US without obstruction.  UA bland. Renal function stagnant at 3 today after fluids stopped yesterday.  -She looks great today - I think she might have a little ATN too and will need more time to return to baseline which may be a new higher baseline -OK to discharge - emailed my office to arrange close f/u with Dr. Hollie Salk in 1-2 weeks.  -Resume home torsemide at d/c with instructions to hold if not drinking -Continue to monitor daily Cr, Dose meds for GFR<15 -Monitor Daily I/Os, Daily weight -Maintain  MAP>65 for optimal renal perfusion. -Agree with holding ACE-I, avoid further nephrotoxins including NSAIDS, Morphine.  Unless absolutely necessary, avoid CT with contrast and/or MRI with gadolinium.      Diastolic CHF: nearly euvolemic, per above resume diuretic at c/d.    Hypertension: -BP 120-130s this AM on home meds   Normocytic anemia, anemia of chronic kidney disease: -Transfuse for Hgb<7 g/dL -Hemoglobin okay, monitor for now   Diabetes Mellitus Type 2 with Hyperglycemia -Per primary   Elevated LFTs -per primary.  Statin and allopurinol on hold   Dispo: OK to d/c from my perspective - notified [facility] office of need for close f/u, they will contact her with date/time.  Will sign off - please call with concerns.   Jannifer Hick MD 09/27/2020, 11:50 AM  [facility] Pager: [phone]

## 2020-09-28 ENCOUNTER — Encounter (HOSPITAL_COMMUNITY): Payer: Self-pay

## 2020-09-28 ENCOUNTER — Other Ambulatory Visit (HOSPITAL_COMMUNITY): Payer: Medicare Other

## 2020-09-30 ENCOUNTER — Other Ambulatory Visit: Payer: Self-pay | Admitting: *Deleted

## 2020-09-30 NOTE — Patient Outreach (Signed)
[facility]) Care Management  09/30/2020  Lynna [name] [place] [birth year] [id number]   Noted that member discharged from hospital (admitted 7/8-7/12 for chronic kidney disease).  Call placed to member, no answer, HIPAA compliant voice message left.  Will follow up within the next 3-4 business days.  [name], [place], MSN [place] [phone]

## 2020-10-02 ENCOUNTER — Other Ambulatory Visit: Payer: Self-pay | Admitting: Cardiovascular Disease

## 2020-10-03 ENCOUNTER — Other Ambulatory Visit: Payer: Self-pay | Admitting: Internal Medicine

## 2020-10-04 DIAGNOSIS — R7401 Elevation of levels of liver transaminase levels: Secondary | ICD-10-CM | POA: Diagnosis not present

## 2020-10-04 DIAGNOSIS — N179 Acute kidney failure, unspecified: Secondary | ICD-10-CM | POA: Diagnosis not present

## 2020-10-05 ENCOUNTER — Other Ambulatory Visit: Payer: Self-pay | Admitting: *Deleted

## 2020-10-05 NOTE — Patient Outreach (Signed)
[name]) Care Management  10/05/2020  [name] [birth year] [id number]   Outreach attempt #2, successful.  State she is doing well since recent discharged.  Decided not to go home but to stay at a friend's to help with her recovery.  Friend has been helping to manage MD appointments and medications.  Friend not available to review medication changes, advised to have her review what member is taking with discharge instructions. Member verbalizes understanding.  Denies any urgent concerns, encouraged to contact this care manager with questions.  Agrees to follow up within the next month.   Goals Addressed             This Visit's Progress    [facility] - Make and Keep All Appointments   On track    Timeframe:  Short-Term Goal Priority:  Medium Start Date:     6/27                        Expected End Date:       7/27                Follow Up Date 09/30/2020  Barriers: Financial Knowledge     - ask family or friend for a ride - keep a calendar with appointment dates    Why is this important?   Part of staying healthy is seeing the doctor for follow-up care.  If you forget your appointments, there are some things you can do to stay on track.    Notes:   6/27 - Conference calls placed to schedule follow up appointments. GI scheduled for 7/1, PCP for 7/12, and cardiology for 8/3.  She will drive self to these appointments  7/20 - Was readmitted to hospital 7/8-7/12.  Followed up with PCP yesterday, will go to cancer center on 7/22, and have appointment with cardiology on 8/3.  Either her friend or family will provide transportation as she is not currently driving.     [facility] - Manage My Diet   On track    Timeframe:  Long-Range Goal Priority:  High Start Date:   6/27                        Expected End Date:           9/27            Follow Up Date 09/30/2020    - ask for help if I have trouble affording healthy foods - keep healthy snacks on hand - track  weight in diary - weigh myself daily    Why is this important?   A healthy diet is important for mental and physical health.  Healthy food helps repair damaged body tissue and maintains strong bones and muscles.  No single food is just right so eating a variety of proteins, fruits, vegetables and grains is best.  You may need to change what you eat or drink to manage kidney disease.  A dietitian is the best person to guide you.     Notes:   6/27 - Discussed recent weight loss (195 pounds in May, currently 145 pounds).  Has follow up with GI.  Report appetite is increasing, N/V is decreasing.  Referral to Care Guide for community resources, including food.  7/20 - Report she is now staying with a friend, which has helped her concern for food resources as well as ability to cook food during  recovery.         [name], [place], MSN [place] [phone]

## 2020-10-07 ENCOUNTER — Inpatient Hospital Stay: Payer: Medicare Other

## 2020-10-07 ENCOUNTER — Other Ambulatory Visit: Payer: Self-pay

## 2020-10-07 VITALS — BP 127/62 | HR 73 | Temp 98.6°F | Resp 16

## 2020-10-07 DIAGNOSIS — D464 Refractory anemia, unspecified: Secondary | ICD-10-CM

## 2020-10-07 DIAGNOSIS — E538 Deficiency of other specified B group vitamins: Secondary | ICD-10-CM | POA: Diagnosis not present

## 2020-10-07 DIAGNOSIS — N184 Chronic kidney disease, stage 4 (severe): Secondary | ICD-10-CM | POA: Diagnosis not present

## 2020-10-07 DIAGNOSIS — D509 Iron deficiency anemia, unspecified: Secondary | ICD-10-CM

## 2020-10-07 DIAGNOSIS — D631 Anemia in chronic kidney disease: Secondary | ICD-10-CM | POA: Diagnosis not present

## 2020-10-07 LAB — CBC WITH DIFFERENTIAL (CANCER CENTER ONLY)
Abs Immature Granulocytes: 0.01 K/uL (ref 0.00–0.07)
Basophils Absolute: 0 K/uL (ref 0.0–0.1)
Basophils Relative: 0 %
Eosinophils Absolute: 0 K/uL (ref 0.0–0.5)
Eosinophils Relative: 1 %
HCT: 27.5 % — ABNORMAL LOW (ref 36.0–46.0)
Hemoglobin: 8.6 g/dL — ABNORMAL LOW (ref 12.0–15.0)
Immature Granulocytes: 0 %
Lymphocytes Relative: 19 %
Lymphs Abs: 1.3 K/uL (ref 0.7–4.0)
MCH: 27.1 pg (ref 26.0–34.0)
MCHC: 31.3 g/dL (ref 30.0–36.0)
MCV: 86.8 fL (ref 80.0–100.0)
Monocytes Absolute: 0.5 K/uL (ref 0.1–1.0)
Monocytes Relative: 8 %
Neutro Abs: 4.7 K/uL (ref 1.7–7.7)
Neutrophils Relative %: 72 %
Platelet Count: 178 K/uL (ref 150–400)
RBC: 3.17 MIL/uL — ABNORMAL LOW (ref 3.87–5.11)
RDW: 15.9 % — ABNORMAL HIGH (ref 11.5–15.5)
WBC Count: 6.5 K/uL (ref 4.0–10.5)
nRBC: 0 % (ref 0.0–0.2)

## 2020-10-07 MED ORDER — EPOETIN ALFA-EPBX 40000 UNIT/ML IJ SOLN
INTRAMUSCULAR | Status: AC
  Filled 2020-10-07: qty 1

## 2020-10-07 MED ORDER — CYANOCOBALAMIN 1000 MCG/ML IJ SOLN
1000.0000 ug | Freq: Once | INTRAMUSCULAR | Status: AC
  Administered 2020-10-07: 1000 ug via INTRAMUSCULAR

## 2020-10-07 MED ORDER — EPOETIN ALFA-EPBX 40000 UNIT/ML IJ SOLN
40000.0000 [IU] | Freq: Once | INTRAMUSCULAR | Status: AC
  Administered 2020-10-07: 40000 [IU] via SUBCUTANEOUS

## 2020-10-07 MED ORDER — CYANOCOBALAMIN 1000 MCG/ML IJ SOLN
INTRAMUSCULAR | Status: AC
  Filled 2020-10-07: qty 1

## 2020-10-07 NOTE — Patient Instructions (Signed)
Epoetin Alfa injection What is this medication? EPOETIN ALFA (e POE e tin AL fa) helps your body make more red blood cells. This medicine is used to treat anemia caused by chronic kidney disease, cancer chemotherapy, or HIV-therapy. It may also be used before surgery if you haveanemia. This medicine may be used for other purposes; ask your health care provider orpharmacist if you have questions. COMMON BRAND NAME(S): Epogen, Procrit, Retacrit What should I tell my care team before I take this medication? They need to know if you have any of these conditions: cancer heart disease high blood pressure history of blood clots history of stroke low levels of folate, iron, or vitamin B12 in the blood seizures an unusual or allergic reaction to erythropoietin, albumin, benzyl alcohol, hamster proteins, other medicines, foods, dyes, or preservatives pregnant or trying to get pregnant breast-feeding How should I use this medication? This medicine is for injection into a vein or under the skin. It is Gaffer by a health care professional in a hospital or clinic setting. If you get this medicine at home, you will be taught how to prepare and give this medicine. Use exactly as directed. Take your medicine at regularintervals. Do not take your medicine more often than directed. It is important that you put your used needles and syringes in a special sharps container. Do not put them in a trash can. If you do not have a sharpscontainer, call your pharmacist or healthcare provider to get one. A special MedGuide will be given to you by the pharmacist with eachprescription and refill. Be sure to read this information carefully each time. Talk to your pediatrician regarding the use of this medicine in children. Whilethis drug may be prescribed for selected conditions, precautions do apply. Overdosage: If you think you have taken too much of this medicine contact apoison control center or emergency room at  once. NOTE: This medicine is only for you. Do not share this medicine with others. What if I miss a dose? If you miss a dose, take it as soon as you can. If it is almost time for yournext dose, take only that dose. Do not take double or extra doses. What may interact with this medication? Interactions have not been studied. This list may not describe all possible interactions. Give your health care provider a list of all the medicines, herbs, non-prescription drugs, or dietary supplements you use. Also tell them if you smoke, drink alcohol, or use illegaldrugs. Some items may interact with your medicine. What should I watch for while using this medication? Your condition will be monitored carefully while you are receiving thismedicine. You may need blood work done while you are taking this medicine. This medicine may cause a decrease in vitamin B6. You should make sure that you get enough vitamin B6 while you are taking this medicine. Discuss the foods youeat and the vitamins you take with your health care professional. What side effects may I notice from receiving this medication? Side effects that you should report to your doctor or health care professionalas soon as possible: allergic reactions like skin rash, itching or hives, swelling of the face, lips, or tongue seizures signs and symptoms of a blood clot such as breathing problems; changes in vision; chest pain; severe, sudden headache; pain, swelling, warmth in the leg; trouble speaking; sudden numbness or weakness of the face, arm or leg signs and symptoms of a stroke like changes in vision; confusion; trouble speaking or understanding; severe headaches; sudden numbness or  weakness of the face, arm or leg; trouble walking; dizziness; loss of balance or coordination Side effects that usually do not require medical attention (report to yourdoctor or health care professional if they continue or are  bothersome): chills cough dizziness fever headaches joint pain muscle cramps muscle pain nausea, vomiting pain, redness, or irritation at site where injected This list may not describe all possible side effects. Call your doctor for medical advice about side effects. You may report side effects to FDA at1-800-FDA-1088. Where should I keep my medication? Keep out of the reach of children. Store in a refrigerator between 2 and 8 degrees C (36 and 46 degrees F). Do not freeze or shake. Throw away any unused portion if using a single-dose vial. Multi-dose vials can be kept in the refrigerator for up to 21 days after theinitial dose. Throw away unused medicine. NOTE: This sheet is a summary. It may not cover all possible information. If you have questions about this medicine, talk to your doctor, pharmacist, orhealth care provider. Cyanocobalamin, Vitamin B12 injection O que  este medicamento? A CIANOCOBALAMINA  uma forma sinttica de vitamina B12. A vitamina B12  essencial para o desenvolvimento de glbulos sanguneos, clulas nervosas e protenas saudveis pelo organismo. Tambm ajuda no metabolismo das gorduras e carboidratos. Este medicamento  usado para tratar pessoas que no conseguem absorver vitamina B12 suficiente. Este medicamento pode ser usado para outros propsitos; em caso de dvidas, pergunte ao seu profissional de sade ou farmacutico. NOMES DE MARCAS COMUNS: B-12 Compliance Kit, B-12 Injection Kit, Cyomin, LA-12, Nutri-Twelve, Physicians EZ Use B-12, Primabalt O que devo dizer a meu profissional de sade antes de tomar este medicamento? Precisam saber se voc tem algum dos seguintes problemas ou estados de sade: doenas renais sndrome ou doena de Leber anemia megaloblstica reao estranha ou alergia  cianocobalamina ou ao cobalto reao estranha ou alergia a outros medicamentos, alimentos, corantes ou conservantes est grvida ou tentando engravidar est  amamentando Como devo usar este medicamento? Este medicamento  injetado por via intramuscular ou por injeo subcutnea profunda. Costuma ser administrado por um profissional de sade em consultrio ou Chief of Staff. Porm,  possvel que seu mdico lhe ensine como aplicar suas prprias injees. Siga todas as instrues. Fale com seu pediatra a respeito do uso deste medicamento em crianas. Pode ser preciso tomar alguns cuidados especiais. Superdosagem: Se achar que tomou uma superdosagem deste medicamento, entre em contato imediatamente com o [facility] de Intoxicaes ou v a [employer]. OBSERVAO: Este medicamento  s para voc. No compartilhe este medicamento com outras pessoas. E se eu deixar de tomar uma dose? Se toma o medicamento em uma clnica ou no consultrio do seu mdico, ligue para Paramedic a Passenger transport manager. Se aplica as injees por conta prpria e perdeu uma dose, tome-a assim que possvel. Se j estiver quase na hora da sua prxima dose, tome somente essa dose. No tome o remdio em dobro, nem tome uma dose adicional. O que pode interagir com este medicamento? colchicina consumo pesado de lcool Esta lista pode no descrever todas as interaes possveis. D ao seu profissional de sade uma lista de todos os medicamentos, ervas medicinais, remdios de venda livre, ou suplementos alimentares que voc [place]. Diga tambm se voc fuma, bebe, ou [place] drogas ilcitas. Alguns destes podem interagir com o seu medicamento. Ao que devo ficar atento quando estiver [employer] medicamento? Consulte seu mdico ou profissional de sade para acompanhamento regular Museum/gallery curator. Voc precisar fazer exames de sangue peridicos enquanto  estiver [employer]. Voc pode precisar seguir uma dieta especial. Fale com seu mdico. Para conseguir o mximo benefcio deste medicamento, limite o seu consumo de lcool e evite fumar. Que efeitos colaterais posso sentir aps usar este  medicamento? Efeitos colaterais que devem ser informados ao seu mdico ou profissional de sade o mais rpido possvel: reaes alrgicas, como erupo na pele, coceira, urticria, ou inchao do rosto, dos lbios ou da lngua pele azulada dor ou aperto no peito chiado no peito ou dificuldade para respirar tontura rea vermelha, inchada e dolorosa na perna Efeitos colaterais que normalmente no precisam de cuidados mdicos (avise ao seu mdico ou profissional de sade se persistirem ou forem incmodos): diarreia dor de cabea Esta lista pode no descrever todos os efeitos colaterais possveis. Para mais orientaes sobre efeitos colaterais, consulte o seu mdico. Voc pode relatar a ocorrncia de efeitos colaterais  FDA pelo telefone [phone]. Onde devo guardar meu medicamento? [facility] fora do [employer]. Conservar em temperatura ambiente, entre 15 e 30 degreesC [phone] e 86 degreesF). Proteger Administrator, arts. Descartar qualquer medicamento no utilizado aps a data de validade impressa no rtulo ou embalagem. OBSERVAO: Este folheto  um resumo. Pode no cobrir todas as informaes possveis. Se tiver dvidas a respeito deste medicamento, fale com seu mdico, farmacutico ou profissional de sade.  2021 Elsevier/Gold Standard (2009-12-07 00:00:00)   2022 Elsevier/Gold Standard (2016-10-12 08:35:19)

## 2020-10-12 DIAGNOSIS — E1122 Type 2 diabetes mellitus with diabetic chronic kidney disease: Secondary | ICD-10-CM | POA: Diagnosis not present

## 2020-10-12 DIAGNOSIS — D631 Anemia in chronic kidney disease: Secondary | ICD-10-CM | POA: Diagnosis not present

## 2020-10-12 DIAGNOSIS — I129 Hypertensive chronic kidney disease with stage 1 through stage 4 chronic kidney disease, or unspecified chronic kidney disease: Secondary | ICD-10-CM | POA: Diagnosis not present

## 2020-10-12 DIAGNOSIS — C50312 Malignant neoplasm of lower-inner quadrant of left female breast: Secondary | ICD-10-CM | POA: Diagnosis not present

## 2020-10-12 DIAGNOSIS — N2581 Secondary hyperparathyroidism of renal origin: Secondary | ICD-10-CM | POA: Diagnosis not present

## 2020-10-12 DIAGNOSIS — N184 Chronic kidney disease, stage 4 (severe): Secondary | ICD-10-CM | POA: Diagnosis not present

## 2020-10-12 DIAGNOSIS — R7989 Other specified abnormal findings of blood chemistry: Secondary | ICD-10-CM | POA: Diagnosis not present

## 2020-10-12 DIAGNOSIS — N390 Urinary tract infection, site not specified: Secondary | ICD-10-CM | POA: Diagnosis not present

## 2020-10-12 DIAGNOSIS — I82451 Acute embolism and thrombosis of right peroneal vein: Secondary | ICD-10-CM | POA: Diagnosis not present

## 2020-10-12 DIAGNOSIS — I5032 Chronic diastolic (congestive) heart failure: Secondary | ICD-10-CM | POA: Diagnosis not present

## 2020-10-13 NOTE — Progress Notes (Signed)
Cardiology Office Note   Date:  10/19/2020   ID:  [name], DOB 05-18-1945, MRN [id number]  PCP:  Seward Carol, MD  Cardiologist:  Dr. Johnsie Cancel     No chief complaint on file.     History of Present Illness:  75 y.o. with history of CAD rotoblator LAD in 2000. Chronic dyspnea from diastolic dysfunction and anemia. Volume overload from renal failure. She had a poor quality myovue 02/27/18 but no obvious ischemia. TTE done 06/30/20 EF 55-60% mild LVH only impaired relaxation and mild MR    May 2019 admitted with sepsis syndrome pneumonia She had alveolar hemorrhage and brief PEA Arrest post bronchoscopy and was intubated for days and d/c to inpatient rehab    Breast cancer and sees Dr Burr Medico was on Arimidex but hospitalized in July 2019  with SBO pneumonia and pancreatic cyst. Noted to have renal vein thrombosis and placed on xarelto  For 6 months stopped 01/2018   06/29/20 Right perioneal DVT to be on eliquis 3 months  Admitted 07/20/20 with volume overload d/c on Demedex 40 mg daily  Admitted 09/03/20 with nausea/gastritis  and anemia Got dehydrated And Cr elevated to 4.9 admitted 09/23/20 On c/c Cr only rebounded to 3 range And she was d/c on 20 mg Toresemide LFTls elevated and statin held    No cardiac complaints Has lost a lot of weight Seeing Dr Hollie Salk for renal       Past Medical History:  Diagnosis Date   Abdominal discomfort    Chronic N/V/D. Presumptive dx Crohn's dx per elevated p ANCA. Failed Entocort and Pentasa. Sep 2003 - ileocolectomy c anastomosis per Dr Deon Pilling 2/2 adhesions - path was hegative for Crohns. EGD, Sm bowel follow through (11/03), and an eteroclysis (10/03) were unrevealing. Cuases hypomag and hypocalcemia.   Adnexal mass 8/03   s/p lap BSO (R ovarian fibroma) & lysis of adhesions   Allergy    Seasonal   Anemia    Multifactorial. Baseline HgB 10-11 ish. B12 def - 150 in 3/10. Fe Def - ferritin 35 3/10. Both are being repleted.   Breast cancer  ([facility]) 03/16/13   right, 5 o'clock   CAD (coronary artery disease) 1996   1996 - PTCA and angioplasty diagonal branch. 2000 - Rotoblator & angiopllasty of diagonal. 2006 - subendocardial AMI, DES to proximal LAD.Marland Kitchen Also had 90% stenosis in distal apical LAD. EF 55 with apical hypokinesis. Indefinite ASA and Plavix.   CHF (congestive heart failure) (HCC)    Chronic kidney disease    Chronic renal insuff baseline Cr 1.2 - 1.4 ish.   Chronic pain    CT 10/10 = Spinal stenosis L2 - S1.   Diabetes mellitus    Insulin dependent   Gout    Hx of radiation therapy 06/02/13- 07/16/13   right rbeast 4500 cGy 25 sessions, right breast boost 1600 cGy in 8 sessions   Hyperlipidemia    Managed with both a statin and Welchol. Welchol stopped 2014 2/2 cost and started on fenofibrate    Hypertension    2006 B renal arteries patent. 2003 MRA - no RAS. 2003 pheo W/U Dr Hassell Done reportedly negative.   Hypoxia 07/23/2017   Lupus ([place])    Lymphedema of breast    Personal history of radiation therapy 2015   RBBB    Renal vein thrombosis (HCC)    SBO (small bowel obstruction) ([place]) 09/17/2017   Secondary hyperparathyroidism ([place])    Stroke [facility])    Incidental  finding MRI 2002 L lacunar infarct   Vitamin B12 deficiency    Vitamin D deficiency    Wears dentures    top    Past Surgical History:  Procedure Laterality Date   ABDOMINAL HYSTERECTOMY     BILATERAL SALPINGOOPHORECTOMY  8/03   Lap BSO (R ovarian fibroma) and adhesion lysis   BIOPSY  09/08/2020   Procedure: BIOPSY;  Surgeon: Ronald Lobo, MD;  Location: [facility];  Service: Endoscopy;;   BOWEL RESECTION  2003   ileocolectomy with anastomosis 2/2 adhesions   BREAST BIOPSY Right 2015   BREAST BIOPSY Right 07/2014   BREAST LUMPECTOMY Right 04/22/2013   BREAST LUMPECTOMY WITH NEEDLE LOCALIZATION AND AXILLARY SENTINEL LYMPH NODE BX Right 04/22/2013   Procedure: BREAST LUMPECTOMY WITH NEEDLE LOCALIZATION AND AXILLARY SENTINEL LYMPH NODE BX;  Surgeon:  Stark Klein, MD;  Location: [facility];  Service: General;  Laterality: Right;   CARDIAC CATHETERIZATION     2 stents   CHOLECYSTECTOMY     COLONOSCOPY     ESOPHAGOGASTRODUODENOSCOPY N/A 09/08/2020   Procedure: ESOPHAGOGASTRODUODENOSCOPY (EGD);  Surgeon: Ronald Lobo, MD;  Location: [facility];  Service: Endoscopy;  Laterality: N/A;   HEMICOLECTOMY     R sided hemicolectomy   HERNIA REPAIR     Ventral hernia repair   PTCA  4/06     Current Outpatient Medications  Medication Sig Dispense Refill   acetaminophen (TYLENOL) 500 MG tablet Take 1,000 mg by mouth every 6 (six) hours as needed for moderate pain or headache.     amLODipine (NORVASC) 10 MG tablet Take 1 tablet (10 mg total) by mouth daily. 30 tablet 0   apixaban (ELIQUIS) 5 MG TABS tablet Take 1 tablet (5 mg total) by mouth 2 (two) times daily. Start after you finish the starter pack 60 tablet 2   budesonide (ENTOCORT EC) 3 MG 24 hr capsule Take 1 capsule by mouth once daily 30 capsule 0   Cyanocobalamin (B-12 COMPLIANCE INJECTION IJ) Inject 1 vial as directed every 30 (thirty) days.     Epoetin Alfa-epbx (RETACRIT IJ) Inject 1 vial as directed every 30 (thirty) days.     ergocalciferol (VITAMIN D2) 1.25 MG (50000 UT) capsule Take 1 capsule (50,000 Units total) by mouth once a week. 4 capsule 1   famotidine (PEPCID) 20 MG tablet Take 1 tablet (20 mg total) by mouth at bedtime.     FLUoxetine (PROZAC) 20 MG capsule Take 20 mg by mouth at bedtime.     hydrALAZINE (APRESOLINE) 100 MG tablet TAKE 1 TABLET BY MOUTH 3  TIMES DAILY 90 tablet 0   isosorbide mononitrate (IMDUR) 30 MG 24 hr tablet TAKE 1/2 (ONE-HALF) TABLET BY MOUTH ONCE DAILY (PLEASE  SCHEDULE  AN  APPOINTMENT  FOR  FURTHER  REFILLS) 45 tablet 0   linagliptin (TRADJENTA) 5 MG TABS tablet Take 5 mg by mouth daily.     lipase/protease/amylase (CREON) 36000 UNITS CPEP capsule Take 36,000 Units by mouth 3 (three) times daily with meals.     loperamide  (IMODIUM A-D) 2 MG tablet Take 4 mg by mouth 4 (four) times daily as needed for diarrhea or loose stools.     Melatonin 5 MG TABS Take 10 mg by mouth at bedtime.     nebivolol (BYSTOLIC) 10 MG tablet Take 1 tablet (10 mg total) by mouth daily. 90 tablet 0   nitroGLYCERIN (NITROSTAT) 0.4 MG SL tablet DISSOLVE 1 TABLET UNDER THE TONGUE EVERY 5 MINUTES AS  NEEDED FOR  CHEST PAIN. MAX  OF 3 TABLETS IN 15 MINUTES. CALL 911 IF PAIN PERSISTS. 25 tablet 0   ondansetron (ZOFRAN ODT) 4 MG disintegrating tablet Take 1 tablet (4 mg total) by mouth every 8 (eight) hours as needed for nausea or vomiting. 12 tablet 0   pantoprazole (PROTONIX) 40 MG tablet Take 1 tablet (40 mg total) by mouth daily. 30 tablet 3   pregabalin (LYRICA) 25 MG capsule Take 1 capsule (25 mg total) by mouth 2 (two) times daily. 60 capsule 1   sucralfate (CARAFATE) 1 g tablet Take 1 g by mouth 4 (four) times daily -  with meals and at bedtime.     torsemide (DEMADEX) 20 MG tablet Take 1 tablet (20 mg total) by mouth daily. 180 tablet 3   No current facility-administered medications for this visit.    Allergies:   Percocet [oxycodone-acetaminophen]; Diazepam; Haloperidol lactate; Lorazepam; Morphine sulfate; Propoxyphene hcl; Tramadol hcl; Anesthetics, halogenated; Etomidate; Fentanyl; Haloperidol; Morphine sulfate; Oxycodone-acetaminophen; Propoxyphene; Tramadol hcl; and Versed [midazolam]    Social History:  The patient  reports that she has never smoked. She has never used smokeless tobacco. She reports that she does not drink alcohol and does not use drugs.   Family History:  The patient's family history includes Breast cancer in her maternal aunt and maternal grandmother; Breast cancer (age of onset: 71) in her mother; Clotting disorder in her maternal grandmother; Colon cancer in her maternal grandmother; Diabetes in her maternal grandmother; Heart attack in her maternal grandmother; Hypertension in her daughter; Kidney failure in her  maternal aunt; Lung cancer in her maternal aunt; Pancreatic cancer (age of onset: 56) in her brother; Prostate cancer in her maternal uncle.    ROS:  General:no colds or fevers,  Weight down 2 lbs. From last visit.  Skin:no rashes or ulcers HEENT:no blurred vision, no congestion CV:see HPI PUL:see HPI GI:no diarrhea constipation or melena, no indigestion GU:no hematuria, no dysuria MS:no joint pain, no claudication Neuro:no syncope, no lightheadedness Endo:+ diabetes, no thyroid disease  Wt Readings from Last 3 Encounters:  10/19/20 65.3 kg  09/26/20 71.2 kg  09/08/20 69.7 kg     PHYSICAL EXAM: VS:  BP 130/62   Pulse 76   Ht 5' 2"  (1.575 m)   Wt 65.3 kg   SpO2 98%   BMI 26.34 kg/m  , BMI Body mass index is 26.34 kg/m.  Affect appropriate Chronically ill overweight black female  HEENT: normal Neck supple with no adenopathy JVP normal no bruits no thyromegaly Lungs clear with no wheezing and good diaphragmatic motion Heart:  S1/S2 no murmur, no rub, gallop or click PMI normal Abdomen: benighn, BS positve, no tenderness, no AAA no bruit.  No HSM or HJR Distal pulses intact with no bruits No edema Neuro non-focal Skin warm and dry No muscular weakness    EKG:   SR rate 79 RBBB 08/22/18   Recent Labs: 07/20/2020: B Natriuretic Peptide 195.0 09/25/2020: ALT 173 09/26/2020: Magnesium 1.9 09/27/2020: BUN 41; Creatinine, Ser 3.04; Potassium 4.1; Sodium 137 10/07/2020: Hemoglobin 8.6; Platelet Count 178    Lipid Panel    Component Value Date/Time   CHOL 183 07/21/2015 1106   TRIG 107 07/24/2017 1444   HDL 47 07/21/2015 1106   CHOLHDL 3.9 07/21/2015 1106   CHOLHDL 3.8 09/10/2013 1041   VLDL 56 (H) 09/10/2013 1041   LDLCALC 88 07/21/2015 1106   LDLDIRECT 55 07/01/2012 1016       Other studies Reviewed: Additional studies/ records that  were reviewed today include: . Echo 09/04/18   IMPRESSIONS      1. The left ventricle has normal systolic function with an  ejection fraction of 60-65%. The cavity size was normal. There is moderate asymmetric left ventricular hypertrophy. Left ventricular diastolic Doppler parameters are consistent with impaired  relaxation. Indeterminate filling pressures The E/e' is 8-15. No evidence of left ventricular regional wall motion abnormalities.  2. The right ventricle has normal systolic function. The cavity was normal. There is no increase in right ventricular wall thickness.  3. The mitral valve is abnormal. Mild thickening of the mitral valve leaflet.  4. The tricuspid valve is grossly normal.  5. The aortic valve is grossly normal. No stenosis of the aortic valve.  6. The average left ventricular global longitudinal strain is -19.6 %.   SUMMARY   LVEF 60-65%, moderate assymetric septal hypertrophy, grade 1 DD, indeterminate LV filling pressure, normal GLS at -20%, trivial MR, normal LA size, trivial TR, normal RVSP, normal IVC  FINDINGS  Left Ventricle: The left ventricle has normal systolic function, with an ejection fraction of 60-65%. The cavity size was normal. There is moderate asymmetric left ventricular hypertrophy. Left ventricular diastolic Doppler parameters are consistent  with impaired relaxation. Indeterminate filling pressures The E/e' is 8-15. No evidence of left ventricular regional wall motion abnormalities.. The average left ventricular global longitudinal strain is -19.6 %.     Nuc study 02/27/18 Addendum by Sueanne Margarita, MD on Sun Mar 02, 2018  7:58 PM  The left ventricular ejection fraction is moderately decreased (30-44%). Nuclear stress EF: 37%. There was no ST segment deviation noted during stress. Study is of very poor quality due to evere extracardiac tracer uptake Recommend repeat stress images or consider coronary CTA    Finalized by Sueanne Margarita, MD on Sun Mar 02, 2018  7:57 PM  The left ventricular ejection fraction is moderately decreased (30-44%). Nuclear stress EF:  37%. There was no ST segment deviation noted during stress.     ASSESSMENT AND PLAN:  1. Angina resolved on imdur defer cath due to CRF low risk myovue 02/27/18  2. DOE: EF normal by echo 09/04/18  Lungs clear today  Typically more related to anemia and renal failure  3.  Diastolic HF chronic stable.  Her anemia plays a role but with IRON and arenesp this may improve.  4.  Anemia followed by oncology/GI multifactorial due to GI bleed, CRF  5. CRF:  F/u [name]  worsening with Cr 3.04 09/27/20   6. DVT:  right peroneal on eliquis for 3 months      Current medicines are reviewed with the patient today.  The patient Has no concerns regarding medicines.  The following changes have been made:  See above Labs/ tests ordered today include:see above  Disposition:   FU:  6 months   Signed, [name], MD  10/19/2020 8:13 AM    [address], [place] [place], [place] Phone: [phone]; Fax: [phone]

## 2020-10-18 NOTE — ED Provider Notes (Signed)
[facility] Provider Note   CSN: [id number] Arrival date & time: 09/23/20  1805     History Chief Complaint  Patient presents with   abnormal labs    [name] is a 75 y.o. female.  HPI Late chart completion. Presents feeling generally well, but with concern for abnormal labs. Patient was seen, evaluated, admitted about 2 weeks ago following GI like illness. She presents today after follow-up studies demonstrated worsening renal function.  No current physical complaints.    Past Medical History:  Diagnosis Date   Abdominal discomfort    Chronic N/V/D. Presumptive dx Crohn's dx per elevated p ANCA. Failed Entocort and Pentasa. Sep 2003 - ileocolectomy c anastomosis per Dr Deon Pilling 2/2 adhesions - path was hegative for Crohns. EGD, Sm bowel follow through (11/03), and an eteroclysis (10/03) were unrevealing. Cuases hypomag and hypocalcemia.   Adnexal mass 8/03   s/p lap BSO (R ovarian fibroma) & lysis of adhesions   Allergy    Seasonal   Anemia    Multifactorial. Baseline HgB 10-11 ish. B12 def - 150 in 3/10. Fe Def - ferritin 35 3/10. Both are being repleted.   Breast cancer ([place]) 03/16/13   right, 5 o'clock   CAD (coronary artery disease) 1996   1996 - PTCA and angioplasty diagonal branch. 2000 - Rotoblator & angiopllasty of diagonal. 2006 - subendocardial AMI, DES to proximal LAD.Marland Kitchen Also had 90% stenosis in distal apical LAD. EF 55 with apical hypokinesis. Indefinite ASA and Plavix.   CHF (congestive heart failure) (HCC)    Chronic kidney disease    Chronic renal insuff baseline Cr 1.2 - 1.4 ish.   Chronic pain    CT 10/10 = Spinal stenosis L2 - S1.   Diabetes mellitus    Insulin dependent   Gout    Hx of radiation therapy 06/02/13- 07/16/13   right rbeast 4500 cGy 25 sessions, right breast boost 1600 cGy in 8 sessions   Hyperlipidemia    Managed with both a statin and Welchol. Welchol stopped 2014 2/2 cost and started on fenofibrate     Hypertension    2006 B renal arteries patent. 2003 MRA - no RAS. 2003 pheo W/U Dr Hassell Done reportedly negative.   Hypoxia 07/23/2017   Lupus ([place])    Lymphedema of breast    Personal history of radiation therapy 2015   RBBB    Renal vein thrombosis (HCC)    SBO (small bowel obstruction) ([place]) 09/17/2017   Secondary hyperparathyroidism ([place])    Stroke ([place])    Incidental finding MRI 2002 L lacunar infarct   Vitamin B12 deficiency    Vitamin D deficiency    Wears dentures    top    Patient Active Problem List   Diagnosis Date Noted   Malnutrition of moderate degree 09/27/2020   Transaminitis 09/23/2020   Acute renal failure superimposed on stage 4 chronic kidney disease ([place]) 09/03/2020   Chronic diastolic CHF (congestive heart failure) ([place]) 09/03/2020   CHF (congestive heart failure) ([place]) 07/20/2020   Dyspnea 06/28/2020   Chronic diarrhea - responds to budesonide 09/10/2019   Iron deficiency anemia 08/19/2018   Spondylosis of lumbar spine 12/24/2017   Renal vein thrombosis ([place]) 09/17/2017   Hoarse voice quality 09/09/2017   Low back pain without sciatica 09/09/2017   AKI (acute kidney injury) ([place]) 08/25/2017   Labile blood glucose    Sleep disturbance    Leukocytosis    Essential hypertension    Urinary frequency  Type 2 diabetes mellitus with peripheral neuropathy (HCC)    Benign essential HTN    Stage 2 chronic kidney disease    Dysphagia    Debility    Encephalopathy    Pressure injury of skin 08/04/2017   Pneumonia of left lung due to infectious organism    Diffuse pulmonary alveolar hemorrhage    SOB (shortness of breath) 10/11/2016   Hyperparathyroidism, secondary renal ([place]) 05/02/2016   Gout 04/11/2015   Diabetic neuropathy associated with type 2 diabetes mellitus ([place]) 01/06/2015   SBO (small bowel obstruction) ([place]) 09/15/2013   Breast cancer of lower-inner quadrant of right female breast ([place]) 03/18/2013   Chronic venous insufficiency 01/06/2013    Health care maintenance 05/08/2011   Diabetes mellitus type 2 in obese ([place]) 05/03/2010   Seasonal allergies 05/03/2010   Labile hypertension    CAD (coronary artery disease)    Abdominal discomfort    Anemia in chronic kidney disease    History of stroke without residual deficits    Hyperlipidemia associated with type 2 diabetes mellitus (HCC)    Chronic pain    Chronic kidney disease, stage III (moderate) (HCC)    Adnexal mass    Spinal stenosis, lumbar region, with neurogenic claudication 01/03/2009    Past Surgical History:  Procedure Laterality Date   ABDOMINAL HYSTERECTOMY     BILATERAL SALPINGOOPHORECTOMY  8/03   Lap BSO (R ovarian fibroma) and adhesion lysis   BIOPSY  09/08/2020   Procedure: BIOPSY;  Surgeon: Ronald Lobo, MD;  Location: [facility];  Service: Endoscopy;;   BOWEL RESECTION  2003   ileocolectomy with anastomosis 2/2 adhesions   BREAST BIOPSY Right 2015   BREAST BIOPSY Right 07/2014   BREAST LUMPECTOMY Right 04/22/2013   BREAST LUMPECTOMY WITH NEEDLE LOCALIZATION AND AXILLARY SENTINEL LYMPH NODE BX Right 04/22/2013   Procedure: BREAST LUMPECTOMY WITH NEEDLE LOCALIZATION AND AXILLARY SENTINEL LYMPH NODE BX;  Surgeon: Stark Klein, MD;  Location: [facility];  Service: General;  Laterality: Right;   CARDIAC CATHETERIZATION     2 stents   CHOLECYSTECTOMY     COLONOSCOPY     ESOPHAGOGASTRODUODENOSCOPY N/A 09/08/2020   Procedure: ESOPHAGOGASTRODUODENOSCOPY (EGD);  Surgeon: Ronald Lobo, MD;  Location: [facility];  Service: Endoscopy;  Laterality: N/A;   HEMICOLECTOMY     R sided hemicolectomy   HERNIA REPAIR     Ventral hernia repair   PTCA  4/06     OB History   No obstetric history on file.     Family History  Problem Relation Age of Onset   Pancreatic cancer Brother 61   Breast cancer Mother 41   Lung cancer Maternal Aunt    Breast cancer Maternal Aunt    Prostate cancer Maternal Uncle    Heart attack Maternal Grandmother     Breast cancer Maternal Grandmother    Colon cancer Maternal Grandmother    Clotting disorder Maternal Grandmother    Diabetes Maternal Grandmother    Kidney failure Maternal Aunt    Hypertension Daughter     Social History   Tobacco Use   Smoking status: Never   Smokeless tobacco: Never  Vaping Use   Vaping Use: Never used  Substance Use Topics   Alcohol use: No    Alcohol/week: 0.0 standard drinks   Drug use: No    Home Medications Prior to Admission medications   Medication Sig Start Date End Date Taking? Authorizing Provider  acetaminophen (TYLENOL) 500 MG tablet Take 1,000 mg by mouth  every 6 (six) hours as needed for moderate pain or headache.   Yes [provider]  amLODipine (NORVASC) 10 MG tablet Take 1 tablet (10 mg total) by mouth daily. 08/22/17  Yes Angiulli, Lavon Paganini, PA-C  apixaban (ELIQUIS) 5 MG TABS tablet Take 1 tablet (5 mg total) by mouth 2 (two) times daily. Start after you finish the starter pack 07/24/20  Yes Vann, Jessica U, DO  Cyanocobalamin (B-12 COMPLIANCE INJECTION IJ) Inject 1 vial as directed every 30 (thirty) days.   Yes [provider]  Epoetin Alfa-epbx (RETACRIT IJ) Inject 1 vial as directed every 30 (thirty) days.   Yes [provider]  ergocalciferol (VITAMIN D2) 1.25 MG (50000 UT) capsule Take 1 capsule (50,000 Units total) by mouth once a week. 08/02/20  Yes Truitt Merle, MD  FLUoxetine (PROZAC) 20 MG capsule Take 20 mg by mouth at bedtime.   Yes [provider]  isosorbide mononitrate (IMDUR) 30 MG 24 hr tablet TAKE 1/2 (ONE-HALF) TABLET BY MOUTH ONCE DAILY (PLEASE  SCHEDULE  AN  APPOINTMENT  FOR  FURTHER  REFILLS) 07/12/20  Yes Isaiah Serge, NP  linagliptin (TRADJENTA) 5 MG TABS tablet Take 5 mg by mouth daily.   Yes [provider]  lipase/protease/amylase (CREON) 36000 UNITS CPEP capsule Take 36,000 Units by mouth 3 (three) times daily with meals.   Yes [provider]  loperamide (IMODIUM  A-D) 2 MG tablet Take 4 mg by mouth 4 (four) times daily as needed for diarrhea or loose stools.   Yes [provider]  Melatonin 5 MG TABS Take 10 mg by mouth at bedtime.   Yes [provider]  nebivolol (BYSTOLIC) 10 MG tablet Take 1 tablet (10 mg total) by mouth daily. 06/30/20  Yes Mercy Riding, MD  ondansetron (ZOFRAN ODT) 4 MG disintegrating tablet Take 1 tablet (4 mg total) by mouth every 8 (eight) hours as needed for nausea or vomiting. 08/17/20  Yes Walisiewicz, Kaitlyn E, PA-C  pantoprazole (PROTONIX) 40 MG tablet Take 1 tablet (40 mg total) by mouth daily. 09/11/20 01/09/21 Yes Oswald Hillock, MD  sucralfate (CARAFATE) 1 g tablet Take 1 g by mouth 4 (four) times daily -  with meals and at bedtime.   Yes [provider]  torsemide (DEMADEX) 20 MG tablet Take 1 tablet (20 mg total) by mouth daily. 09/10/20  Yes Oswald Hillock, MD  budesonide (ENTOCORT EC) 3 MG 24 hr capsule Take 1 capsule by mouth once daily 10/04/20   Gatha Mayer, MD  famotidine (PEPCID) 20 MG tablet Take 1 tablet (20 mg total) by mouth at bedtime. 09/27/20   Debbe Odea, MD  hydrALAZINE (APRESOLINE) 100 MG tablet TAKE 1 TABLET BY MOUTH 3  TIMES DAILY 10/04/20   Jerline Pain, MD  nitroGLYCERIN (NITROSTAT) 0.4 MG SL tablet DISSOLVE 1 TABLET UNDER THE TONGUE EVERY 5 MINUTES AS  NEEDED FOR CHEST PAIN. MAX  OF 3 TABLETS IN 15 MINUTES. CALL 911 IF PAIN PERSISTS. 10/04/20   Jerline Pain, MD  pregabalin (LYRICA) 25 MG capsule Take 1 capsule (25 mg total) by mouth 2 (two) times daily. 07/19/20   Kirsteins, Luanna Salk, MD    Allergies    Percocet [oxycodone-acetaminophen]; Diazepam; Haloperidol lactate; Lorazepam; Morphine sulfate; Propoxyphene hcl; Tramadol hcl; Anesthetics, halogenated; Etomidate; Fentanyl; Haloperidol; Morphine sulfate; Oxycodone-acetaminophen; Propoxyphene; Tramadol hcl; and Versed [midazolam]  Review of Systems   Review of Systems  Constitutional:        Per HPI,  otherwise negative   HENT:         Per HPI, otherwise negative  Respiratory:         Per HPI, otherwise negative  Cardiovascular:        Per HPI, otherwise negative  Gastrointestinal:  Negative for vomiting.  Endocrine:       Negative aside from HPI  Genitourinary:        Neg aside from HPI   Musculoskeletal:        Per HPI, otherwise negative  Skin: Negative.   Neurological:  Negative for syncope.   Physical Exam Updated Vital Signs BP (!) 128/53 (BP Location: Left Arm)   Pulse 76   Temp 98.2 F (36.8 C) (Oral)   Resp 18   Ht 5' 4"  (1.626 m)   Wt 71.2 kg   SpO2 96%   BMI 26.94 kg/m   Physical Exam Vitals and nursing note reviewed.  Constitutional:      General: She is not in acute distress.    Appearance: She is well-developed.  HENT:     Head: Normocephalic and atraumatic.  Eyes:     Conjunctiva/sclera: Conjunctivae normal.  Cardiovascular:     Rate and Rhythm: Normal rate and regular rhythm.  Pulmonary:     Effort: Pulmonary effort is normal. No respiratory distress.     Breath sounds: Normal breath sounds. No stridor.  Abdominal:     General: There is no distension.  Skin:    General: Skin is warm and dry.  Neurological:     Mental Status: She is alert and oriented to person, place, and time.     Cranial Nerves: No cranial nerve deficit.    ED Results / Procedures / Treatments   Labs (all labs ordered are listed, but only abnormal results are displayed) Labs Reviewed  CBC WITH DIFFERENTIAL/PLATELET - Abnormal; Notable for the following components:      Result Value   WBC 11.5 (*)    Hemoglobin 11.1 (*)    Neutro Abs 8.7 (*)    Abs Immature Granulocytes 0.23 (*)    All other components within normal limits  COMPREHENSIVE METABOLIC PANEL - Abnormal; Notable for the following components:   CO2 18 (*)    Glucose, Bld 180 (*)    BUN 66 (*)    Creatinine, Ser 4.81 (*)    Albumin 3.1 (*)    AST 410 (*)    ALT 290 (*)    GFR, Estimated 9 (*)    All other components  within normal limits  MAGNESIUM - Abnormal; Notable for the following components:   Magnesium 1.4 (*)    All other components within normal limits  PHOSPHORUS - Abnormal; Notable for the following components:   Phosphorus 1.5 (*)    All other components within normal limits  CBC - Abnormal; Notable for the following components:   RBC 3.72 (*)    Hemoglobin 10.1 (*)    HCT 31.5 (*)    All other components within normal limits  COMPREHENSIVE METABOLIC PANEL - Abnormal; Notable for the following components:   CO2 21 (*)    BUN 57 (*)    Creatinine, Ser 3.93 (*)    Total Protein 6.2 (*)    Albumin 2.7 (*)    AST 255 (*)    ALT 234 (*)    GFR, Estimated 12 (*)    All other components within normal limits  GLUCOSE, CAPILLARY - Abnormal; Notable for the following components:  Glucose-Capillary 106 (*)    All other components within normal limits  COMPREHENSIVE METABOLIC PANEL - Abnormal; Notable for the following components:   Potassium 3.1 (*)    Glucose, Bld 110 (*)    BUN 57 (*)    Creatinine, Ser 3.46 (*)    Total Protein 5.8 (*)    Albumin 2.5 (*)    AST 187 (*)    ALT 207 (*)    GFR, Estimated 13 (*)    All other components within normal limits  GLUCOSE, CAPILLARY - Abnormal; Notable for the following components:   Glucose-Capillary 114 (*)    All other components within normal limits  GLUCOSE, CAPILLARY - Abnormal; Notable for the following components:   Glucose-Capillary 158 (*)    All other components within normal limits  COMPREHENSIVE METABOLIC PANEL - Abnormal; Notable for the following components:   Potassium 3.1 (*)    Glucose, Bld 132 (*)    BUN 52 (*)    Creatinine, Ser 3.38 (*)    Calcium 8.7 (*)    Total Protein 5.5 (*)    Albumin 2.5 (*)    AST 153 (*)    ALT 183 (*)    GFR, Estimated 14 (*)    All other components within normal limits  GLUCOSE, CAPILLARY - Abnormal; Notable for the following components:   Glucose-Capillary 143 (*)    All other  components within normal limits  GLUCOSE, CAPILLARY - Abnormal; Notable for the following components:   Glucose-Capillary 101 (*)    All other components within normal limits  MAGNESIUM - Abnormal; Notable for the following components:   Magnesium 1.5 (*)    All other components within normal limits  COMPREHENSIVE METABOLIC PANEL - Abnormal; Notable for the following components:   Potassium 3.4 (*)    Chloride 97 (*)    Glucose, Bld 108 (*)    BUN 47 (*)    Creatinine, Ser 3.09 (*)    Total Protein 6.2 (*)    Albumin 2.6 (*)    AST 107 (*)    ALT 173 (*)    GFR, Estimated 15 (*)    All other components within normal limits  URINALYSIS, ROUTINE W REFLEX MICROSCOPIC - Abnormal; Notable for the following components:   APPearance HAZY (*)    All other components within normal limits  GLUCOSE, CAPILLARY - Abnormal; Notable for the following components:   Glucose-Capillary 187 (*)    All other components within normal limits  GLUCOSE, CAPILLARY - Abnormal; Notable for the following components:   Glucose-Capillary 178 (*)    All other components within normal limits  GLUCOSE, CAPILLARY - Abnormal; Notable for the following components:   Glucose-Capillary 155 (*)    All other components within normal limits  GLUCOSE, CAPILLARY - Abnormal; Notable for the following components:   Glucose-Capillary 108 (*)    All other components within normal limits  BASIC METABOLIC PANEL - Abnormal; Notable for the following components:   Chloride 97 (*)    Glucose, Bld 121 (*)    BUN 40 (*)    Creatinine, Ser 2.92 (*)    Calcium 8.6 (*)    GFR, Estimated 16 (*)    All other components within normal limits  GLUCOSE, CAPILLARY - Abnormal; Notable for the following components:   Glucose-Capillary 137 (*)    All other components within normal limits  GLUCOSE, CAPILLARY - Abnormal; Notable for the following components:   Glucose-Capillary 142 (*)    All other  components within normal limits  RENAL  FUNCTION PANEL - Abnormal; Notable for the following components:   CO2 34 (*)    Glucose, Bld 140 (*)    BUN 41 (*)    Creatinine, Ser 3.04 (*)    Calcium 8.5 (*)    Phosphorus 1.4 (*)    Albumin 2.2 (*)    GFR, Estimated 16 (*)    All other components within normal limits  CBC - Abnormal; Notable for the following components:   RBC 3.35 (*)    Hemoglobin 9.0 (*)    HCT 28.8 (*)    nRBC 0.4 (*)    All other components within normal limits  GLUCOSE, CAPILLARY - Abnormal; Notable for the following components:   Glucose-Capillary 153 (*)    All other components within normal limits  GLUCOSE, CAPILLARY - Abnormal; Notable for the following components:   Glucose-Capillary 143 (*)    All other components within normal limits  GLUCOSE, CAPILLARY - Abnormal; Notable for the following components:   Glucose-Capillary 110 (*)    All other components within normal limits  CBG MONITORING, ED - Abnormal; Notable for the following components:   Glucose-Capillary 127 (*)    All other components within normal limits  SARS CORONAVIRUS 2 (TAT 6-24 HRS)  GLUCOSE, CAPILLARY  GLUCOSE, CAPILLARY  MAGNESIUM  CBG MONITORING, ED  CBG MONITORING, ED    EKG None  Radiology No results found.  Procedures Procedures   Medications Ordered in ED Medications  sodium chloride 0.9 % bolus 1,000 mL (0 mLs Intravenous Stopped 09/24/20 0208)  magnesium sulfate IVPB 2 g 50 mL (0 g Intravenous Stopped 09/24/20 0135)  potassium chloride SA (KLOR-CON) CR tablet 40 mEq (40 mEq Oral Given 09/25/20 1655)  magnesium sulfate IVPB 2 g 50 mL (0 g Intravenous Stopped 09/25/20 1329)    ED Course  I have reviewed the triage vital signs and the nursing notes.  Pertinent labs & imaging results that were available during my care of the patient were reviewed by me and considered in my medical decision making (see chart for details).  Adult female presents after recent GI like illness, now with overall general  improvement, but with labs concerning for worsening renal function.  Given this, patient required admission for further monitoring, management. Final Clinical Impression(s) / ED Diagnoses Final diagnoses:  Acute kidney failure ([place])  Transaminitis  Type 2 diabetes mellitus with peripheral neuropathy ([place])    Rx / DC Orders ED Discharge Orders          Ordered    famotidine (PEPCID) 20 MG tablet  Daily at bedtime        09/27/20 1301    Increase activity slowly        09/27/20 1301    Diet - low sodium heart healthy        09/27/20 1301    Diet Carb Modified        09/27/20 1301             Carmin Muskrat, MD 10/18/20 1821

## 2020-10-19 ENCOUNTER — Ambulatory Visit (INDEPENDENT_AMBULATORY_CARE_PROVIDER_SITE_OTHER): Payer: Medicare Other | Admitting: Cardiovascular Disease

## 2020-10-19 ENCOUNTER — Encounter: Payer: Self-pay | Admitting: Cardiovascular Disease

## 2020-10-19 ENCOUNTER — Other Ambulatory Visit: Payer: Self-pay

## 2020-10-19 VITALS — BP 130/62 | HR 76 | Ht 62.0 in | Wt 144.0 lb

## 2020-10-19 DIAGNOSIS — I5032 Chronic diastolic (congestive) heart failure: Secondary | ICD-10-CM | POA: Diagnosis not present

## 2020-10-19 DIAGNOSIS — N184 Chronic kidney disease, stage 4 (severe): Secondary | ICD-10-CM

## 2020-10-19 DIAGNOSIS — I209 Angina pectoris, unspecified: Secondary | ICD-10-CM

## 2020-10-19 NOTE — Patient Instructions (Signed)
Medication Instructions:  *If you need a refill on your cardiac medications before your next appointment, please call your pharmacy*  Lab Work: If you have labs (blood work) drawn today and your tests are completely normal, you will receive your results only by: [place] (if you have MyChart) OR A paper copy in the mail If you have any lab test that is abnormal or we need to change your treatment, we will call you to review the results.  Follow-Up: At [facility], you and your health needs are our priority.  As part of our continuing mission to provide you with exceptional heart care, we have created designated Provider Care Teams.  These Care Teams include your primary Cardiologist (physician) and Advanced Practice Providers (APPs -  Physician Assistants and Nurse Practitioners) who all work together to provide you with the care you need, when you need it.  We recommend signing up for the patient portal called "MyChart".  Sign up information is provided on this After Visit Summary.  MyChart is used to connect with patients for Virtual Visits (Telemedicine).  Patients are able to view lab/test results, encounter notes, upcoming appointments, etc.  Non-urgent messages can be sent to your provider as well.   To learn more about what you can do with MyChart, go to [email or web address].    Your next appointment:   1 year(s)  The format for your next appointment:   In Person  Provider:   You may see [name], MD or one of the following Advanced Practice Providers on your designated Care Team:   Cecilie Kicks, NP

## 2020-10-21 ENCOUNTER — Other Ambulatory Visit: Payer: Self-pay | Admitting: Cardiology

## 2020-10-24 ENCOUNTER — Other Ambulatory Visit: Payer: Self-pay | Admitting: Internal Medicine

## 2020-10-27 ENCOUNTER — Encounter: Payer: Self-pay | Admitting: Hematology

## 2020-10-31 ENCOUNTER — Telehealth: Payer: Self-pay | Admitting: Hematology

## 2020-10-31 NOTE — Telephone Encounter (Signed)
Scheduled appts per 8/15 sch msg. Called pt, no answer. Left msg with appts dates and times.

## 2020-11-01 ENCOUNTER — Inpatient Hospital Stay (HOSPITAL_BASED_OUTPATIENT_CLINIC_OR_DEPARTMENT_OTHER)
Admission: EM | Admit: 2020-11-01 | Discharge: 2020-11-04 | DRG: 683 | Disposition: A | Discharge status: Home / Self Care | Payer: Medicare Other | Attending: Family Medicine | Admitting: Family Medicine

## 2020-11-01 ENCOUNTER — Emergency Department (HOSPITAL_BASED_OUTPATIENT_CLINIC_OR_DEPARTMENT_OTHER): Payer: Medicare Other

## 2020-11-01 ENCOUNTER — Other Ambulatory Visit: Payer: Self-pay

## 2020-11-01 ENCOUNTER — Encounter (HOSPITAL_BASED_OUTPATIENT_CLINIC_OR_DEPARTMENT_OTHER): Payer: Self-pay

## 2020-11-01 DIAGNOSIS — F32A Depression, unspecified: Secondary | ICD-10-CM | POA: Diagnosis not present

## 2020-11-01 DIAGNOSIS — I2510 Atherosclerotic heart disease of native coronary artery without angina pectoris: Secondary | ICD-10-CM | POA: Diagnosis not present

## 2020-11-01 DIAGNOSIS — R7401 Elevation of levels of liver transaminase levels: Secondary | ICD-10-CM | POA: Diagnosis not present

## 2020-11-01 DIAGNOSIS — Z79899 Other long term (current) drug therapy: Secondary | ICD-10-CM | POA: Diagnosis not present

## 2020-11-01 DIAGNOSIS — M109 Gout, unspecified: Secondary | ICD-10-CM | POA: Diagnosis present

## 2020-11-01 DIAGNOSIS — Z8616 Personal history of COVID-19: Secondary | ICD-10-CM | POA: Diagnosis not present

## 2020-11-01 DIAGNOSIS — Z885 Allergy status to narcotic agent status: Secondary | ICD-10-CM

## 2020-11-01 DIAGNOSIS — I152 Hypertension secondary to endocrine disorders: Secondary | ICD-10-CM | POA: Diagnosis present

## 2020-11-01 DIAGNOSIS — Z86718 Personal history of other venous thrombosis and embolism: Secondary | ICD-10-CM

## 2020-11-01 DIAGNOSIS — K219 Gastro-esophageal reflux disease without esophagitis: Secondary | ICD-10-CM | POA: Diagnosis not present

## 2020-11-01 DIAGNOSIS — F419 Anxiety disorder, unspecified: Secondary | ICD-10-CM | POA: Diagnosis present

## 2020-11-01 DIAGNOSIS — Z8674 Personal history of sudden cardiac arrest: Secondary | ICD-10-CM

## 2020-11-01 DIAGNOSIS — Z800 Family history of malignant neoplasm of digestive organs: Secondary | ICD-10-CM | POA: Diagnosis not present

## 2020-11-01 DIAGNOSIS — E119 Type 2 diabetes mellitus without complications: Secondary | ICD-10-CM | POA: Diagnosis present

## 2020-11-01 DIAGNOSIS — Z20822 Contact with and (suspected) exposure to covid-19: Secondary | ICD-10-CM | POA: Diagnosis present

## 2020-11-01 DIAGNOSIS — Z801 Family history of malignant neoplasm of trachea, bronchus and lung: Secondary | ICD-10-CM

## 2020-11-01 DIAGNOSIS — E872 Acidosis: Secondary | ICD-10-CM | POA: Diagnosis present

## 2020-11-01 DIAGNOSIS — U071 COVID-19: Secondary | ICD-10-CM

## 2020-11-01 DIAGNOSIS — I5032 Chronic diastolic (congestive) heart failure: Secondary | ICD-10-CM | POA: Diagnosis not present

## 2020-11-01 DIAGNOSIS — D638 Anemia in other chronic diseases classified elsewhere: Secondary | ICD-10-CM | POA: Diagnosis not present

## 2020-11-01 DIAGNOSIS — R634 Abnormal weight loss: Secondary | ICD-10-CM | POA: Diagnosis not present

## 2020-11-01 DIAGNOSIS — I130 Hypertensive heart and chronic kidney disease with heart failure and stage 1 through stage 4 chronic kidney disease, or unspecified chronic kidney disease: Secondary | ICD-10-CM | POA: Diagnosis not present

## 2020-11-01 DIAGNOSIS — E1169 Type 2 diabetes mellitus with other specified complication: Secondary | ICD-10-CM | POA: Diagnosis present

## 2020-11-01 DIAGNOSIS — K5090 Crohn's disease, unspecified, without complications: Secondary | ICD-10-CM | POA: Diagnosis present

## 2020-11-01 DIAGNOSIS — Z803 Family history of malignant neoplasm of breast: Secondary | ICD-10-CM | POA: Diagnosis not present

## 2020-11-01 DIAGNOSIS — I10 Essential (primary) hypertension: Secondary | ICD-10-CM | POA: Diagnosis not present

## 2020-11-01 DIAGNOSIS — Z8249 Family history of ischemic heart disease and other diseases of the circulatory system: Secondary | ICD-10-CM

## 2020-11-01 DIAGNOSIS — N184 Chronic kidney disease, stage 4 (severe): Secondary | ICD-10-CM | POA: Diagnosis present

## 2020-11-01 DIAGNOSIS — Z90710 Acquired absence of both cervix and uterus: Secondary | ICD-10-CM

## 2020-11-01 DIAGNOSIS — E869 Volume depletion, unspecified: Secondary | ICD-10-CM | POA: Diagnosis not present

## 2020-11-01 DIAGNOSIS — E1122 Type 2 diabetes mellitus with diabetic chronic kidney disease: Secondary | ICD-10-CM | POA: Diagnosis present

## 2020-11-01 DIAGNOSIS — D649 Anemia, unspecified: Secondary | ICD-10-CM | POA: Diagnosis not present

## 2020-11-01 DIAGNOSIS — Z853 Personal history of malignant neoplasm of breast: Secondary | ICD-10-CM

## 2020-11-01 DIAGNOSIS — E1110 Type 2 diabetes mellitus with ketoacidosis without coma: Secondary | ICD-10-CM | POA: Diagnosis not present

## 2020-11-01 DIAGNOSIS — D631 Anemia in chronic kidney disease: Secondary | ICD-10-CM | POA: Diagnosis not present

## 2020-11-01 DIAGNOSIS — E1142 Type 2 diabetes mellitus with diabetic polyneuropathy: Secondary | ICD-10-CM | POA: Diagnosis not present

## 2020-11-01 DIAGNOSIS — Z955 Presence of coronary angioplasty implant and graft: Secondary | ICD-10-CM

## 2020-11-01 DIAGNOSIS — R531 Weakness: Secondary | ICD-10-CM | POA: Diagnosis not present

## 2020-11-01 DIAGNOSIS — K2950 Unspecified chronic gastritis without bleeding: Secondary | ICD-10-CM | POA: Diagnosis not present

## 2020-11-01 DIAGNOSIS — N179 Acute kidney failure, unspecified: Principal | ICD-10-CM

## 2020-11-01 DIAGNOSIS — I509 Heart failure, unspecified: Secondary | ICD-10-CM | POA: Diagnosis not present

## 2020-11-01 DIAGNOSIS — E7800 Pure hypercholesterolemia, unspecified: Secondary | ICD-10-CM | POA: Diagnosis not present

## 2020-11-01 DIAGNOSIS — Z923 Personal history of irradiation: Secondary | ICD-10-CM

## 2020-11-01 DIAGNOSIS — Z7901 Long term (current) use of anticoagulants: Secondary | ICD-10-CM

## 2020-11-01 DIAGNOSIS — M4807 Spinal stenosis, lumbosacral region: Secondary | ICD-10-CM | POA: Diagnosis present

## 2020-11-01 DIAGNOSIS — Z833 Family history of diabetes mellitus: Secondary | ICD-10-CM

## 2020-11-01 DIAGNOSIS — Z7984 Long term (current) use of oral hypoglycemic drugs: Secondary | ICD-10-CM

## 2020-11-01 DIAGNOSIS — D509 Iron deficiency anemia, unspecified: Secondary | ICD-10-CM | POA: Diagnosis not present

## 2020-11-01 DIAGNOSIS — Z832 Family history of diseases of the blood and blood-forming organs and certain disorders involving the immune mechanism: Secondary | ICD-10-CM

## 2020-11-01 LAB — CBC WITH DIFFERENTIAL/PLATELET
Abs Immature Granulocytes: 0.04 10*3/uL (ref 0.00–0.07)
Basophils Absolute: 0 10*3/uL (ref 0.0–0.1)
Basophils Relative: 0 %
Eosinophils Absolute: 0 10*3/uL (ref 0.0–0.5)
Eosinophils Relative: 0 %
HCT: 33.9 % — ABNORMAL LOW (ref 36.0–46.0)
Hemoglobin: 10.9 g/dL — ABNORMAL LOW (ref 12.0–15.0)
Immature Granulocytes: 0 %
Lymphocytes Relative: 14 %
Lymphs Abs: 1.3 10*3/uL (ref 0.7–4.0)
MCH: 26.7 pg (ref 26.0–34.0)
MCHC: 32.2 g/dL (ref 30.0–36.0)
MCV: 82.9 fL (ref 80.0–100.0)
Monocytes Absolute: 0.8 10*3/uL (ref 0.1–1.0)
Monocytes Relative: 8 %
Neutro Abs: 7.1 10*3/uL (ref 1.7–7.7)
Neutrophils Relative %: 78 %
Platelets: 208 10*3/uL (ref 150–400)
RBC: 4.09 MIL/uL (ref 3.87–5.11)
RDW: 16 % — ABNORMAL HIGH (ref 11.5–15.5)
WBC: 9.3 10*3/uL (ref 4.0–10.5)
nRBC: 0 % (ref 0.0–0.2)

## 2020-11-01 LAB — COMPREHENSIVE METABOLIC PANEL
ALT: 32 U/L (ref 0–44)
AST: 22 U/L (ref 15–41)
Albumin: 3.7 g/dL (ref 3.5–5.0)
Alkaline Phosphatase: 79 U/L (ref 38–126)
Anion gap: 14 (ref 5–15)
BUN: 56 mg/dL — ABNORMAL HIGH (ref 8–23)
CO2: 19 mmol/L — ABNORMAL LOW (ref 22–32)
Calcium: 10 mg/dL (ref 8.9–10.3)
Chloride: 106 mmol/L (ref 98–111)
Creatinine, Ser: 4.47 mg/dL — ABNORMAL HIGH (ref 0.44–1.00)
GFR, Estimated: 10 mL/min — ABNORMAL LOW (ref >60)
Glucose, Bld: 108 mg/dL — ABNORMAL HIGH (ref 70–99)
Potassium: 4.5 mmol/L (ref 3.5–5.1)
Sodium: 139 mmol/L (ref 135–145)
Total Bilirubin: 0.4 mg/dL (ref 0.3–1.2)
Total Protein: 7 g/dL (ref 6.5–8.1)

## 2020-11-01 LAB — RESP PANEL BY RT-PCR (FLU A&B, COVID) ARPGX2
Influenza A by PCR: NEGATIVE
Influenza B by PCR: NEGATIVE
SARS Coronavirus 2 by RT PCR: POSITIVE — AB

## 2020-11-01 MED ORDER — SODIUM CHLORIDE 0.9 % IV BOLUS
1000.0000 mL | Freq: Once | INTRAVENOUS | Status: AC
  Administered 2020-11-01: 1000 mL via INTRAVENOUS

## 2020-11-01 NOTE — ED Triage Notes (Signed)
Patient here POV from Home with Abnormal Labs.  Patient Family states she was at her PCP for a Regular Check-Up and was told her Creatinine was elevated at 4.87 and was instructed to seek ED Evaluation.  No Complaints per Patients besides decreased Appetite. No Urinary Changes per Patient and no Pain.  BIB Wheelchair. GCS 15. A&Ox4. NAD Noted.

## 2020-11-01 NOTE — ED Provider Notes (Signed)
[facility] EMERGENCY DEPT Provider Note   CSN: [id number] Arrival date & time: 11/01/20  2022     History Chief Complaint  Patient presents with   Abnormal Lab    [name] is a 75 y.o. female.  Pt presents to the ED today with an elevated Cr at 4.87.  Pt has a hx of CKD and was admitted from 7/8-12 for an AKI.  She was d/c with a Cr of 3.04.  Her nephrologist decreased her diuretic on 7/26.  Pt went to her pcp today for routine blood work.  She was called today with the elevated Cr level.  Pt has had a poor appetite for the past month.  It has been worse over the last few days.  She has not had any n/v/d.  No f/c.  No sob.      Past Medical History:  Diagnosis Date   Abdominal discomfort    Chronic N/V/D. Presumptive dx Crohn's dx per elevated p ANCA. Failed Entocort and Pentasa. Sep 2003 - ileocolectomy c anastomosis per Dr Deon Pilling 2/2 adhesions - path was hegative for Crohns. EGD, Sm bowel follow through (11/03), and an eteroclysis (10/03) were unrevealing. Cuases hypomag and hypocalcemia.   Adnexal mass 8/03   s/p lap BSO (R ovarian fibroma) & lysis of adhesions   Allergy    Seasonal   Anemia    Multifactorial. Baseline HgB 10-11 ish. B12 def - 150 in 3/10. Fe Def - ferritin 35 3/10. Both are being repleted.   Breast cancer ([place]) 03/16/13   right, 5 o'clock   CAD (coronary artery disease) 1996   1996 - PTCA and angioplasty diagonal branch. 2000 - Rotoblator & angiopllasty of diagonal. 2006 - subendocardial AMI, DES to proximal LAD.Marland Kitchen Also had 90% stenosis in distal apical LAD. EF 55 with apical hypokinesis. Indefinite ASA and Plavix.   CHF (congestive heart failure) (HCC)    Chronic kidney disease    Chronic renal insuff baseline Cr 1.2 - 1.4 ish.   Chronic pain    CT 10/10 = Spinal stenosis L2 - S1.   Diabetes mellitus    Insulin dependent   Gout    Hx of radiation therapy 06/02/13- 07/16/13   right rbeast 4500 cGy 25 sessions, right breast boost  1600 cGy in 8 sessions   Hyperlipidemia    Managed with both a statin and Welchol. Welchol stopped 2014 2/2 cost and started on fenofibrate    Hypertension    2006 B renal arteries patent. 2003 MRA - no RAS. 2003 pheo W/U Dr Hassell Done reportedly negative.   Hypoxia 07/23/2017   Lupus ([place])    Lymphedema of breast    Personal history of radiation therapy 2015   RBBB    Renal vein thrombosis (HCC)    SBO (small bowel obstruction) ([place]) 09/17/2017   Secondary hyperparathyroidism ([place])    Stroke ([place])    Incidental finding MRI 2002 L lacunar infarct   Vitamin B12 deficiency    Vitamin D deficiency    Wears dentures    top    Patient Active Problem List   Diagnosis Date Noted   Malnutrition of moderate degree 09/27/2020   Transaminitis 09/23/2020   Acute renal failure superimposed on stage 4 chronic kidney disease ([place]) 09/03/2020   Chronic diastolic CHF (congestive heart failure) ([place]) 09/03/2020   CHF (congestive heart failure) ([place]) 07/20/2020   Dyspnea 06/28/2020   Chronic diarrhea - responds to budesonide 09/10/2019   Iron deficiency anemia 08/19/2018  Spondylosis of lumbar spine 12/24/2017   Renal vein thrombosis (HCC) 09/17/2017   Hoarse voice quality 09/09/2017   Low back pain without sciatica 09/09/2017   AKI (acute kidney injury) ([place]) 08/25/2017   Labile blood glucose    Sleep disturbance    Leukocytosis    Essential hypertension    Urinary frequency    Type 2 diabetes mellitus with peripheral neuropathy (HCC)    Benign essential HTN    Stage 2 chronic kidney disease    Dysphagia    Debility    Encephalopathy    Pressure injury of skin 08/04/2017   Pneumonia of left lung due to infectious organism    Diffuse pulmonary alveolar hemorrhage    SOB (shortness of breath) 10/11/2016   Hyperparathyroidism, secondary renal ([place]) 05/02/2016   Gout 04/11/2015   Diabetic neuropathy associated with type 2 diabetes mellitus ([place]) 01/06/2015   SBO (small bowel obstruction)  ([place]) 09/15/2013   Breast cancer of lower-inner quadrant of right female breast ([place]) 03/18/2013   Chronic venous insufficiency 01/06/2013   Health care maintenance 05/08/2011   Diabetes mellitus type 2 in obese ([place]) 05/03/2010   Seasonal allergies 05/03/2010   Labile hypertension    CAD (coronary artery disease)    Abdominal discomfort    Anemia in chronic kidney disease    History of stroke without residual deficits    Hyperlipidemia associated with type 2 diabetes mellitus (HCC)    Chronic pain    Chronic kidney disease, stage III (moderate) (HCC)    Adnexal mass    Spinal stenosis, lumbar region, with neurogenic claudication 01/03/2009    Past Surgical History:  Procedure Laterality Date   ABDOMINAL HYSTERECTOMY     BILATERAL SALPINGOOPHORECTOMY  8/03   Lap BSO (R ovarian fibroma) and adhesion lysis   BIOPSY  09/08/2020   Procedure: BIOPSY;  Surgeon: Ronald Lobo, MD;  Location: [facility];  Service: Endoscopy;;   BOWEL RESECTION  2003   ileocolectomy with anastomosis 2/2 adhesions   BREAST BIOPSY Right 2015   BREAST BIOPSY Right 07/2014   BREAST LUMPECTOMY Right 04/22/2013   BREAST LUMPECTOMY WITH NEEDLE LOCALIZATION AND AXILLARY SENTINEL LYMPH NODE BX Right 04/22/2013   Procedure: BREAST LUMPECTOMY WITH NEEDLE LOCALIZATION AND AXILLARY SENTINEL LYMPH NODE BX;  Surgeon: Stark Klein, MD;  Location: [facility];  Service: General;  Laterality: Right;   CARDIAC CATHETERIZATION     2 stents   CHOLECYSTECTOMY     COLONOSCOPY     ESOPHAGOGASTRODUODENOSCOPY N/A 09/08/2020   Procedure: ESOPHAGOGASTRODUODENOSCOPY (EGD);  Surgeon: Ronald Lobo, MD;  Location: [facility];  Service: Endoscopy;  Laterality: N/A;   HEMICOLECTOMY     R sided hemicolectomy   HERNIA REPAIR     Ventral hernia repair   PTCA  4/06     OB History   No obstetric history on file.     Family History  Problem Relation Age of Onset   Pancreatic cancer Brother 102   Breast cancer  Mother 71   Lung cancer Maternal Aunt    Breast cancer Maternal Aunt    Prostate cancer Maternal Uncle    Heart attack Maternal Grandmother    Breast cancer Maternal Grandmother    Colon cancer Maternal Grandmother    Clotting disorder Maternal Grandmother    Diabetes Maternal Grandmother    Kidney failure Maternal Aunt    Hypertension Daughter     Social History   Tobacco Use   Smoking status: Never   Smokeless tobacco: Never  Vaping Use   Vaping Use: Never used  Substance Use Topics   Alcohol use: No    Alcohol/week: 0.0 standard drinks   Drug use: No    Home Medications Prior to Admission medications   Medication Sig Start Date End Date Taking? Authorizing Provider  acetaminophen (TYLENOL) 500 MG tablet Take 1,000 mg by mouth every 6 (six) hours as needed for moderate pain or headache.    [provider]  amLODipine (NORVASC) 10 MG tablet Take 1 tablet (10 mg total) by mouth daily. 08/22/17   Angiulli, Lavon Paganini, PA-C  apixaban (ELIQUIS) 5 MG TABS tablet Take 1 tablet (5 mg total) by mouth 2 (two) times daily. Start after you finish the starter pack 07/24/20   Eulogio Bear U, DO  budesonide (ENTOCORT EC) 3 MG 24 hr capsule Take 1 capsule by mouth once daily 10/04/20   Gatha Mayer, MD  Cyanocobalamin (B-12 COMPLIANCE INJECTION IJ) Inject 1 vial as directed every 30 (thirty) days.    [provider]  Epoetin Alfa-epbx (RETACRIT IJ) Inject 1 vial as directed every 30 (thirty) days.    [provider]  ergocalciferol (VITAMIN D2) 1.25 MG (50000 UT) capsule Take 1 capsule (50,000 Units total) by mouth once a week. 08/02/20   Truitt Merle, MD  famotidine (PEPCID) 20 MG tablet Take 1 tablet (20 mg total) by mouth at bedtime. 09/27/20   Debbe Odea, MD  FLUoxetine (PROZAC) 20 MG capsule Take 20 mg by mouth at bedtime.    [provider]  hydrALAZINE (APRESOLINE) 100 MG tablet TAKE 1 TABLET BY MOUTH 3  TIMES DAILY 10/24/20   Josue Hector, MD   isosorbide mononitrate (IMDUR) 30 MG 24 hr tablet TAKE 1/2 (ONE-HALF) TABLET BY MOUTH ONCE DAILY (PLEASE  SCHEDULE  AN  APPOINTMENT  FOR  FURTHER  REFILLS) 07/12/20   Isaiah Serge, NP  linagliptin (TRADJENTA) 5 MG TABS tablet Take 5 mg by mouth daily.    [provider]  lipase/protease/amylase (CREON) 36000 UNITS CPEP capsule Take 36,000 Units by mouth 3 (three) times daily with meals.    [provider]  loperamide (IMODIUM A-D) 2 MG tablet Take 4 mg by mouth 4 (four) times daily as needed for diarrhea or loose stools.    [provider]  Melatonin 5 MG TABS Take 10 mg by mouth at bedtime.    [provider]  nebivolol (BYSTOLIC) 10 MG tablet Take 1 tablet (10 mg total) by mouth daily. 06/30/20   Mercy Riding, MD  nitroGLYCERIN (NITROSTAT) 0.4 MG SL tablet DISSOLVE 1 TABLET UNDER THE TONGUE EVERY 5 MINUTES AS  NEEDED FOR CHEST PAIN. MAX  OF 3 TABLETS IN 15 MINUTES. CALL 911 IF PAIN PERSISTS. 10/04/20   Jerline Pain, MD  ondansetron (ZOFRAN ODT) 4 MG disintegrating tablet Take 1 tablet (4 mg total) by mouth every 8 (eight) hours as needed for nausea or vomiting. 08/17/20   Walisiewicz, Verline Lema E, PA-C  pantoprazole (PROTONIX) 40 MG tablet TAKE 1 TABLET BY MOUTH  DAILY BEFORE BREAKFAST 10/24/20   Gatha Mayer, MD  pregabalin (LYRICA) 25 MG capsule Take 1 capsule (25 mg total) by mouth 2 (two) times daily. 07/19/20   Kirsteins, Luanna Salk, MD  sucralfate (CARAFATE) 1 g tablet Take 1 g by mouth 4 (four) times daily -  with meals and at bedtime.    [provider]  torsemide (DEMADEX) 20 MG tablet Take 1 tablet (20 mg total) by mouth daily.  09/10/20   Oswald Hillock, MD    Allergies    Percocet [oxycodone-acetaminophen]; Diazepam; Haloperidol lactate; Lorazepam; Morphine sulfate; Propoxyphene hcl; Tramadol hcl; Anesthetics, halogenated; Etomidate; Fentanyl; Haloperidol; Morphine sulfate; Oxycodone-acetaminophen; Propoxyphene; Tramadol hcl; and Versed  [midazolam]  Review of Systems   Review of Systems  Constitutional:  Positive for appetite change.  All other systems reviewed and are negative.  Physical Exam Updated Vital Signs BP (!) 161/81 (BP Location: Left Arm)   Pulse 79   Temp 98.3 F (36.8 C) (Oral)   Resp 14   Ht 5' 2"  (1.575 m)   Wt 61.7 kg   SpO2 99%   BMI 24.87 kg/m   Physical Exam Vitals and nursing note reviewed.  Constitutional:      Appearance: Normal appearance.  HENT:     Head: Normocephalic and atraumatic.     Right Ear: External ear normal.     Left Ear: External ear normal.     Nose: Nose normal.     Mouth/Throat:     Mouth: Mucous membranes are dry.  Eyes:     Extraocular Movements: Extraocular movements intact.     Conjunctiva/sclera: Conjunctivae normal.     Pupils: Pupils are equal, round, and reactive to light.  Cardiovascular:     Rate and Rhythm: Normal rate and regular rhythm.     Pulses: Normal pulses.     Heart sounds: Normal heart sounds.  Pulmonary:     Effort: Pulmonary effort is normal.     Breath sounds: Normal breath sounds.  Abdominal:     General: Abdomen is flat. Bowel sounds are normal.     Palpations: Abdomen is soft.  Musculoskeletal:        General: Normal range of motion.     Cervical back: Normal range of motion and neck supple.  Skin:    General: Skin is warm.     Capillary Refill: Capillary refill takes less than 2 seconds.  Neurological:     General: No focal deficit present.     Mental Status: She is alert and oriented to person, place, and time.  Psychiatric:        Mood and Affect: Mood normal.        Behavior: Behavior normal.        Thought Content: Thought content normal.        Judgment: Judgment normal.    ED Results / Procedures / Treatments   Labs (all labs ordered are listed, but only abnormal results are displayed) Labs Reviewed  RESP PANEL BY RT-PCR (FLU A&B, COVID) ARPGX2 - Abnormal; Notable for the following components:      Result  Value   SARS Coronavirus 2 by RT PCR POSITIVE (*)    All other components within normal limits  COMPREHENSIVE METABOLIC PANEL - Abnormal; Notable for the following components:   CO2 19 (*)    Glucose, Bld 108 (*)    BUN 56 (*)    Creatinine, Ser 4.47 (*)    GFR, Estimated 10 (*)    All other components within normal limits  CBC WITH DIFFERENTIAL/PLATELET - Abnormal; Notable for the following components:   Hemoglobin 10.9 (*)    HCT 33.9 (*)    RDW 16.0 (*)    All other components within normal limits  URINALYSIS, ROUTINE W REFLEX MICROSCOPIC    EKG EKG Interpretation  Date/Time:  Tuesday November 01 2020 20:52:17 EDT Ventricular Rate:  81 PR Interval:  151 QRS Duration: 156 QT Interval:  423 QTC Calculation: 491 R Axis:   9 Text Interpretation: Sinus rhythm Right bundle branch block No significant change since last tracing Confirmed by [name] [phone]) on 11/01/2020 9:27:34 PM  Radiology DG Chest Portable 1 View  Result Date: 11/01/2020 CLINICAL DATA:  Weakness EXAM: PORTABLE CHEST 1 VIEW COMPARISON:  07/20/2020 FINDINGS: Lungs are clear.  No pleural effusion or pneumothorax. The heart is normal in size. Surgical clips along the right lateral chest wall/axilla. Surgical clips in the right upper abdomen. IMPRESSION: No evidence of acute cardiopulmonary disease. Electronically Signed   By: Julian Hy M.D.   On: 11/01/2020 21:02    Procedures Procedures   Medications Ordered in ED Medications  sodium chloride 0.9 % bolus 1,000 mL (1,000 mLs Intravenous New Bag/Given 11/01/20 2121)    ED Course  I have reviewed the triage vital signs and the nursing notes.  Pertinent labs & imaging results that were available during my care of the patient were reviewed by me and considered in my medical decision making (see chart for details).    MDM Rules/Calculators/A&P                           Pt's Cr is 4.47 here with a GFR down to 10.  She is given IVFs.  CXR clear.   She is + for Covid, but is asymptomatic.  She has been fully vaccinated.  Kidney function is too poor for Paxlovid.  I am unable to order Molnupiravir here.  She is d/w Dr. Nevada Crane ([facility]) for admission.  CRITICAL CARE Performed by: [name]   Total critical care time: 30 minutes  Critical care time was exclusive of separately billable procedures and treating other patients.  Critical care was necessary to treat or prevent imminent or life-threatening deterioration.  Critical care was time spent personally by me on the following activities: development of treatment plan with patient and/or surrogate as well as nursing, discussions with consultants, evaluation of patient's response to treatment, examination of patient, obtaining history from patient or surrogate, ordering and performing treatments and interventions, ordering and review of laboratory studies, ordering and review of radiographic studies, pulse oximetry and re-evaluation of patient's condition.   [name] was evaluated in Emergency Department on 11/01/2020 for the symptoms described in the history of present illness. She was evaluated in the context of the global COVID-19 pandemic, which necessitated consideration that the patient might be at risk for infection with the SARS-CoV-2 virus that causes COVID-19. Institutional protocols and algorithms that pertain to the evaluation of patients at risk for COVID-19 are in a state of rapid change based on information released by regulatory bodies including the CDC and federal and state organizations. These policies and algorithms were followed during the patient's care in the ED.  Final Clinical Impression(s) / ED Diagnoses Final diagnoses:  COVID-19  AKI (acute kidney injury) [facility])    Rx / [place] Orders ED Discharge Orders     None        [name], MD 11/01/20 2307

## 2020-11-02 ENCOUNTER — Encounter (HOSPITAL_COMMUNITY): Payer: Self-pay | Admitting: Internal Medicine

## 2020-11-02 DIAGNOSIS — U071 COVID-19: Secondary | ICD-10-CM

## 2020-11-02 DIAGNOSIS — Z20822 Contact with and (suspected) exposure to covid-19: Secondary | ICD-10-CM | POA: Diagnosis present

## 2020-11-02 DIAGNOSIS — K2950 Unspecified chronic gastritis without bleeding: Secondary | ICD-10-CM | POA: Diagnosis present

## 2020-11-02 DIAGNOSIS — D631 Anemia in chronic kidney disease: Secondary | ICD-10-CM | POA: Diagnosis present

## 2020-11-02 DIAGNOSIS — E869 Volume depletion, unspecified: Secondary | ICD-10-CM | POA: Diagnosis present

## 2020-11-02 DIAGNOSIS — Z8616 Personal history of COVID-19: Secondary | ICD-10-CM | POA: Diagnosis not present

## 2020-11-02 DIAGNOSIS — F32A Depression, unspecified: Secondary | ICD-10-CM | POA: Diagnosis present

## 2020-11-02 DIAGNOSIS — N179 Acute kidney failure, unspecified: Secondary | ICD-10-CM | POA: Diagnosis present

## 2020-11-02 DIAGNOSIS — D509 Iron deficiency anemia, unspecified: Secondary | ICD-10-CM | POA: Diagnosis present

## 2020-11-02 DIAGNOSIS — E1142 Type 2 diabetes mellitus with diabetic polyneuropathy: Secondary | ICD-10-CM | POA: Diagnosis present

## 2020-11-02 DIAGNOSIS — Z801 Family history of malignant neoplasm of trachea, bronchus and lung: Secondary | ICD-10-CM | POA: Diagnosis not present

## 2020-11-02 DIAGNOSIS — K219 Gastro-esophageal reflux disease without esophagitis: Secondary | ICD-10-CM | POA: Diagnosis present

## 2020-11-02 DIAGNOSIS — I5032 Chronic diastolic (congestive) heart failure: Secondary | ICD-10-CM | POA: Diagnosis present

## 2020-11-02 DIAGNOSIS — I2510 Atherosclerotic heart disease of native coronary artery without angina pectoris: Secondary | ICD-10-CM | POA: Diagnosis present

## 2020-11-02 DIAGNOSIS — Z800 Family history of malignant neoplasm of digestive organs: Secondary | ICD-10-CM | POA: Diagnosis not present

## 2020-11-02 DIAGNOSIS — M109 Gout, unspecified: Secondary | ICD-10-CM | POA: Diagnosis present

## 2020-11-02 DIAGNOSIS — Z803 Family history of malignant neoplasm of breast: Secondary | ICD-10-CM | POA: Diagnosis not present

## 2020-11-02 DIAGNOSIS — N184 Chronic kidney disease, stage 4 (severe): Secondary | ICD-10-CM | POA: Diagnosis present

## 2020-11-02 DIAGNOSIS — K5090 Crohn's disease, unspecified, without complications: Secondary | ICD-10-CM | POA: Diagnosis present

## 2020-11-02 DIAGNOSIS — Z8249 Family history of ischemic heart disease and other diseases of the circulatory system: Secondary | ICD-10-CM | POA: Diagnosis not present

## 2020-11-02 DIAGNOSIS — I10 Essential (primary) hypertension: Secondary | ICD-10-CM | POA: Diagnosis not present

## 2020-11-02 DIAGNOSIS — E872 Acidosis: Secondary | ICD-10-CM | POA: Diagnosis present

## 2020-11-02 DIAGNOSIS — E1122 Type 2 diabetes mellitus with diabetic chronic kidney disease: Secondary | ICD-10-CM | POA: Diagnosis present

## 2020-11-02 DIAGNOSIS — I130 Hypertensive heart and chronic kidney disease with heart failure and stage 1 through stage 4 chronic kidney disease, or unspecified chronic kidney disease: Secondary | ICD-10-CM | POA: Diagnosis present

## 2020-11-02 DIAGNOSIS — Z79899 Other long term (current) drug therapy: Secondary | ICD-10-CM | POA: Diagnosis not present

## 2020-11-02 DIAGNOSIS — F419 Anxiety disorder, unspecified: Secondary | ICD-10-CM | POA: Diagnosis present

## 2020-11-02 HISTORY — DX: COVID-19: U07.1

## 2020-11-02 LAB — BASIC METABOLIC PANEL
Anion gap: 12 (ref 5–15)
BUN: 49 mg/dL — ABNORMAL HIGH (ref 8–23)
CO2: 20 mmol/L — ABNORMAL LOW (ref 22–32)
Calcium: 9.6 mg/dL (ref 8.9–10.3)
Chloride: 107 mmol/L (ref 98–111)
Creatinine, Ser: 4.03 mg/dL — ABNORMAL HIGH (ref 0.44–1.00)
GFR, Estimated: 11 mL/min — ABNORMAL LOW (ref >60)
Glucose, Bld: 100 mg/dL — ABNORMAL HIGH (ref 70–99)
Potassium: 4.2 mmol/L (ref 3.5–5.1)
Sodium: 139 mmol/L (ref 135–145)

## 2020-11-02 LAB — URINALYSIS, ROUTINE W REFLEX MICROSCOPIC
Bilirubin Urine: NEGATIVE
Glucose, UA: NEGATIVE mg/dL
Hgb urine dipstick: NEGATIVE
Ketones, ur: NEGATIVE mg/dL
Leukocytes,Ua: NEGATIVE
Nitrite: NEGATIVE
Specific Gravity, Urine: 1.009 (ref 1.005–1.030)
pH: 5 (ref 5.0–8.0)

## 2020-11-02 LAB — IRON AND TIBC
Iron: 65 ug/dL (ref 28–170)
Saturation Ratios: 27 % (ref 10.4–31.8)
TIBC: 241 ug/dL — ABNORMAL LOW (ref 250–450)
UIBC: 176 ug/dL

## 2020-11-02 LAB — FERRITIN: Ferritin: 211 ng/mL (ref 11–307)

## 2020-11-02 MED ORDER — AMLODIPINE BESYLATE 10 MG PO TABS
10.0000 mg | ORAL_TABLET | Freq: Every day | ORAL | Status: DC
  Administered 2020-11-02 – 2020-11-04 (×3): 10 mg via ORAL
  Filled 2020-11-02 (×3): qty 1

## 2020-11-02 MED ORDER — ISOSORBIDE MONONITRATE ER 30 MG PO TB24
15.0000 mg | ORAL_TABLET | Freq: Every day | ORAL | Status: DC
  Administered 2020-11-02 – 2020-11-04 (×3): 15 mg via ORAL
  Filled 2020-11-02 (×3): qty 1

## 2020-11-02 MED ORDER — PANCRELIPASE (LIP-PROT-AMYL) 12000-38000 UNITS PO CPEP
36000.0000 [IU] | ORAL_CAPSULE | Freq: Three times a day (TID) | ORAL | Status: DC
  Administered 2020-11-03 – 2020-11-04 (×4): 36000 [IU] via ORAL
  Filled 2020-11-02 (×4): qty 3

## 2020-11-02 MED ORDER — LINAGLIPTIN 5 MG PO TABS
5.0000 mg | ORAL_TABLET | Freq: Every day | ORAL | Status: DC
  Administered 2020-11-02 – 2020-11-04 (×3): 5 mg via ORAL
  Filled 2020-11-02 (×3): qty 1

## 2020-11-02 MED ORDER — PANTOPRAZOLE SODIUM 40 MG PO TBEC
40.0000 mg | DELAYED_RELEASE_TABLET | Freq: Every day | ORAL | Status: DC
  Administered 2020-11-02 – 2020-11-04 (×3): 40 mg via ORAL
  Filled 2020-11-02 (×3): qty 1

## 2020-11-02 MED ORDER — ACETAMINOPHEN 650 MG RE SUPP: 650.0000 mg | Freq: Four times a day (QID) | RECTAL | Status: DC | PRN

## 2020-11-02 MED ORDER — FAMOTIDINE 20 MG PO TABS
20.0000 mg | ORAL_TABLET | Freq: Every day | ORAL | Status: DC
  Administered 2020-11-02 – 2020-11-03 (×2): 20 mg via ORAL
  Filled 2020-11-02 (×2): qty 1

## 2020-11-02 MED ORDER — HEPARIN SODIUM (PORCINE) 5000 UNIT/ML IJ SOLN
5000.0000 [IU] | Freq: Three times a day (TID) | INTRAMUSCULAR | Status: DC
  Administered 2020-11-02: 5000 [IU] via SUBCUTANEOUS
  Filled 2020-11-02: qty 1

## 2020-11-02 MED ORDER — ACETAMINOPHEN 325 MG PO TABS: 650.0000 mg | ORAL_TABLET | Freq: Four times a day (QID) | ORAL | Status: DC | PRN

## 2020-11-02 MED ORDER — APIXABAN 5 MG PO TABS
5.0000 mg | ORAL_TABLET | Freq: Two times a day (BID) | ORAL | Status: DC
  Administered 2020-11-02 – 2020-11-04 (×4): 5 mg via ORAL
  Filled 2020-11-02 (×4): qty 1

## 2020-11-02 MED ORDER — SUCRALFATE 1 G PO TABS
1.0000 g | ORAL_TABLET | Freq: Three times a day (TID) | ORAL | Status: DC
  Administered 2020-11-02 – 2020-11-04 (×4): 1 g via ORAL
  Filled 2020-11-02 (×5): qty 1

## 2020-11-02 MED ORDER — SENNOSIDES-DOCUSATE SODIUM 8.6-50 MG PO TABS: 1.0000 | ORAL_TABLET | Freq: Every evening | ORAL | Status: DC | PRN

## 2020-11-02 MED ORDER — ENSURE ENLIVE PO LIQD
237.0000 mL | Freq: Two times a day (BID) | ORAL | Status: DC
  Administered 2020-11-02: 237 mL via ORAL

## 2020-11-02 MED ORDER — FLUOXETINE HCL 20 MG PO CAPS
20.0000 mg | ORAL_CAPSULE | Freq: Every day | ORAL | Status: DC
  Administered 2020-11-02 – 2020-11-03 (×2): 20 mg via ORAL
  Filled 2020-11-02 (×2): qty 1

## 2020-11-02 MED ORDER — HYDRALAZINE HCL 50 MG PO TABS
100.0000 mg | ORAL_TABLET | Freq: Three times a day (TID) | ORAL | Status: DC
  Administered 2020-11-02 – 2020-11-04 (×7): 100 mg via ORAL
  Filled 2020-11-02 (×7): qty 2

## 2020-11-02 MED ORDER — NEBIVOLOL HCL 10 MG PO TABS
10.0000 mg | ORAL_TABLET | Freq: Every day | ORAL | Status: DC
  Administered 2020-11-02 – 2020-11-04 (×3): 10 mg via ORAL
  Filled 2020-11-02 (×3): qty 1

## 2020-11-02 MED ORDER — BUDESONIDE 3 MG PO CPEP
3.0000 mg | ORAL_CAPSULE | Freq: Every day | ORAL | Status: DC
  Administered 2020-11-02 – 2020-11-04 (×3): 3 mg via ORAL
  Filled 2020-11-02 (×3): qty 1

## 2020-11-02 MED ORDER — MELATONIN 5 MG PO TABS
10.0000 mg | ORAL_TABLET | Freq: Every day | ORAL | Status: DC
  Administered 2020-11-02 – 2020-11-03 (×2): 10 mg via ORAL
  Filled 2020-11-02 (×2): qty 2

## 2020-11-02 MED ORDER — LACTATED RINGERS IV SOLN: INTRAVENOUS | Status: AC

## 2020-11-02 NOTE — ED Notes (Signed)
Pt has been  Transported to [facility] via [facility]link -  Stable

## 2020-11-02 NOTE — Progress Notes (Signed)
PROGRESS NOTE    [name]  TFT:[id number] DOB: 12/10/1945 DOA: 11/01/2020 PCP: Seward Carol, MD   Brief Narrative: [name] is a 75 y.o. female with a history of CAD, chronic diastolic heart failure, breast cancer status post right lumpectomy currently in remission, right lower extremity DVT on Eliquis, CKD stage IV, gout, hypertension, diabetes mellitus type 2, GERD, anxiety, depression, gastritis.  Patient presented secondary to abnormal lab significant for AKI on CKD.  IV fluids started.   Assessment & Plan:   Principal Problem:   Acute renal failure superimposed on stage 4 chronic kidney disease (HCC) Active Problems:   CAD (coronary artery disease)   Type 2 diabetes mellitus with peripheral neuropathy ([place])   Essential hypertension   COVID-19   AKI on CKD stage IV Baseline creatinine difficult to ascertain but appears to be around 3.  Patient presented with a creatinine of 4.47. Nephrology consulted on admission. -Nephrology recommendations: IV fluids.  COVID-19 infection Patient with symptoms of ageusia and anosmia which developed prior to discharge (09/27/2020) from recent admission about 1 month prior.  Currently asymptomatic.  Since symptoms started about 1 month prior, no isolation was initiated.  Chronic anemia Anemia of chronic kidney disease Iron deficiency anemia Stable/slightly improved.  Nephrology planning to trend with volume expansion.  Recommending likely outpatient ESA/IV iron. Patient receives epoetin infusions per oncology.  Diabetes mellitus, type 2 Patient is on [place] as an outpatient.  Hemoglobin A1c of 5.5% from June 2022 which is well controlled. -Continue Tradjenta  Primary hypertension -Continue Imdur, Bystolic, amlodipine, hydralazine  Crohn's disease Chronic gastritis -Continue home Pepcid, Creon, Protonix, budesonide, sucralfate  History of right peroneal vein DVT Previously on Eliquis. Planned end date of July  14th.  History of breast cancer In remission. Patient follows with oncology  DVT prophylaxis: Eliquis Code Status:   Code Status: Full Code Family Communication: None at bedside Disposition Plan: Discharge home likely in 1 to 3 days pending improvement of AKI in addition to nephrology recommendations   Consultants:  Nephrology  Procedures:  None  Antimicrobials: None    Subjective: No dyspnea, chest pain, cough. Urinating.  Objective: Vitals:   11/02/20 0337 11/02/20 0550 11/02/20 0935 11/02/20 1205  BP:  (!) 154/82 127/70 124/83  Pulse:   83 78  Resp:  16  14  Temp:   98 F (36.7 C) 98.3 F (36.8 C)  TempSrc:   Oral Oral  SpO2:      Weight: 62.5 kg     Height: 5' 4"  (1.626 m)       Intake/Output Summary (Last 24 hours) at 11/02/2020 1529 Last data filed at 11/01/2020 2348 Gross per 24 hour  Intake 1000 ml  Output --  Net 1000 ml   Filed Weights   11/01/20 2033 11/02/20 0337  Weight: 61.7 kg 62.5 kg    Examination:  General exam: Appears calm and comfortable  Respiratory system: Clear to auscultation. Respiratory effort normal. Cardiovascular system: S1 & S2 heard, RRR. No murmurs, rubs, gallops or clicks. Gastrointestinal system: Abdomen is nondistended, soft and nontender. No organomegaly or masses felt. Normal bowel sounds heard. Central nervous system: Alert and oriented. No focal neurological deficits. Musculoskeletal: No edema. No calf tenderness Skin: No cyanosis. No rashes Psychiatry: Judgement and insight appear normal. Mood & affect appropriate.     Data Reviewed: I have personally reviewed following labs and imaging studies  CBC Lab Results  Component Value Date   WBC 9.3 11/01/2020   RBC  4.09 11/01/2020   HGB 10.9 (L) 11/01/2020   HCT 33.9 (L) 11/01/2020   MCV 82.9 11/01/2020   MCH 26.7 11/01/2020   PLT 208 11/01/2020   MCHC 32.2 11/01/2020   RDW 16.0 (H) 11/01/2020   LYMPHSABS 1.3 11/01/2020   MONOABS 0.8 11/01/2020   EOSABS  0.0 11/01/2020   BASOSABS 0.0 29/52/8413     Last metabolic panel Lab Results  Component Value Date   NA 139 11/02/2020   K 4.2 11/02/2020   CL 107 11/02/2020   CO2 20 (L) 11/02/2020   BUN 49 (H) 11/02/2020   CREATININE 4.03 (H) 11/02/2020   GLUCOSE 100 (H) 11/02/2020   GFRNONAA 11 (L) 11/02/2020   GFRAA 21 (L) 12/21/2019   CALCIUM 9.6 11/02/2020   PHOS 1.4 (L) 09/27/2020   PROT 7.0 11/01/2020   ALBUMIN 3.7 11/01/2020   LABGLOB 2.9 07/21/2019   AGRATIO 1.1 (L) 02/02/2016   BILITOT 0.4 11/01/2020   ALKPHOS 79 11/01/2020   AST 22 11/01/2020   ALT 32 11/01/2020   ANIONGAP 12 11/02/2020    CBG (last 3)  No results for input(s): GLUCAP in the last 72 hours.   GFR: Estimated Creatinine Clearance: 10.7 mL/min (A) (by C-G formula based on SCr of 4.03 mg/dL (H)).  Coagulation Profile: No results for input(s): INR, PROTIME in the last 168 hours.  Recent Results (from the past 240 hour(s))  Resp Panel by RT-PCR (Flu A&B, Covid) Nasopharyngeal Swab     Status: Abnormal   Collection Time: 11/01/20  9:24 PM   Specimen: Nasopharyngeal Swab; Nasopharyngeal(NP) swabs in vial transport medium  Result Value Ref Range Status   SARS Coronavirus 2 by RT PCR POSITIVE (A) NEGATIVE Final    Comment: RESULT CALLED TO, READ BACK BY AND VERIFIED WITH: RN L. VENEGAS 11/01/20 AT 2239 CH (NOTE) SARS-CoV-2 target nucleic acids are DETECTED.  The SARS-CoV-2 RNA is generally detectable in upper respiratory specimens during the acute phase of infection. Positive results are indicative of the presence of the identified virus, but do not rule out bacterial infection or co-infection with other pathogens not detected by the test. Clinical correlation with patient history and other diagnostic information is necessary to determine patient infection status. The expected result is Negative.  Fact Sheet for Patients: EntrepreneurPulse.com.au  Fact Sheet for Healthcare  Providers: IncredibleEmployment.be  This test is not yet approved or cleared by the [place] FDA and  has been authorized for detection and/or diagnosis of SARS-CoV-2 by FDA under an Emergency Use Authorization (EUA).  This EUA will remain in effect (meaning this test can be  used) for the duration of  the COVID-19 declaration under Section 564(b)(1) of the Act, 21 U.S.C. section 360bbb-3(b)(1), unless the authorization is terminated or revoked sooner.     Influenza A by PCR NEGATIVE NEGATIVE Final   Influenza B by PCR NEGATIVE NEGATIVE Final    Comment: (NOTE) The Xpert Xpress SARS-CoV-2/FLU/RSV plus assay is intended as an aid in the diagnosis of influenza from Nasopharyngeal swab specimens and should not be used as a sole basis for treatment. Nasal washings and aspirates are unacceptable for Xpert Xpress SARS-CoV-2/FLU/RSV testing.  Fact Sheet for Patients: EntrepreneurPulse.com.au  Fact Sheet for Healthcare Providers: IncredibleEmployment.be  This test is not yet approved or cleared by the [place] FDA and has been authorized for detection and/or diagnosis of SARS-CoV-2 by FDA under an Emergency Use Authorization (EUA). This EUA will remain in effect (meaning this test can be used) for the duration  of the COVID-19 declaration under Section 564(b)(1) of the Act, 21 U.S.C. section 360bbb-3(b)(1), unless the authorization is terminated or revoked.  Performed at [facility], [address] [place], [place] [address]         Radiology Studies: DG Chest Portable 1 View  Result Date: 11/01/2020 CLINICAL DATA:  Weakness EXAM: PORTABLE CHEST 1 VIEW COMPARISON:  07/20/2020 FINDINGS: Lungs are clear.  No pleural effusion or pneumothorax. The heart is normal in size. Surgical clips along the right lateral chest wall/axilla. Surgical clips in the right upper abdomen. IMPRESSION: No evidence of  acute cardiopulmonary disease. Electronically Signed   By: Julian Hy M.D.   On: 11/01/2020 21:02        Scheduled Meds:  amLODipine  10 mg Oral Daily   feeding supplement  237 mL Oral BID BM   FLUoxetine  20 mg Oral QHS   heparin  5,000 Units Subcutaneous Q8H   hydrALAZINE  100 mg Oral Q8H   isosorbide mononitrate  15 mg Oral Daily   linagliptin  5 mg Oral Daily   melatonin  10 mg Oral QHS   nebivolol  10 mg Oral Daily   Continuous Infusions:  lactated ringers 100 mL/hr at 11/02/20 1133     LOS: 0 days     Cordelia Poche, MD [facility] 11/02/2020, 3:29 PM  If 7PM-7AM, please contact night-coverage [email or web address]

## 2020-11-02 NOTE — Progress Notes (Signed)
Initial Nutrition Assessment  INTERVENTION:   -Ensure Plus PO BID, each provides 350 kcals and 13g protein  NUTRITION DIAGNOSIS:   Inadequate oral intake related to acute illness, poor appetite as evidenced by per patient/family report.  GOAL:   Patient will meet greater than or equal to 90% of their needs  MONITOR:   PO intake, Supplement acceptance, Labs, Weight trends, I & O's  REASON FOR ASSESSMENT:   Malnutrition Screening Tool    ASSESSMENT:   75 year old African-American woman with past medical history significant for coronary artery disease status post PTCA, hypertension, dyslipidemia, history of congestive heart failure with preserved ejection fraction (55 to 60%), breast cancer status post right lumpectomy/radiation and previously on anastrozole, gout, history of small bowel obstruction and chronic kidney disease stage IV with baseline creatinine that appears to be now 2.5-3.0 following episodes of recurrent acute kidney injury.  Patient reports poor appetite and intakes for almost a month. Currently + for COVID-19 and has a loss of taste and smell. Will order Ensure supplements for additional kcals and protein.  Per weight records, pt has lost 40 lbs since 5/13 (22% wt loss x 3 months, significant for time frame). Suspect some degree of malnutrition but unable to perform NFPE at this time.  Medications: Lactated ringers  Labs reviewed.  NUTRITION - FOCUSED PHYSICAL EXAM:  Unable to complete  Diet Order:   Diet Order             Diet heart healthy/carb modified Room service appropriate? Yes; Fluid consistency: Thin  Diet effective now                   EDUCATION NEEDS:   No education needs have been identified at this time  Skin:  Skin Assessment: Reviewed RN Assessment  Last BM:  8/16  Height:   Ht Readings from Last 1 Encounters:  11/02/20 5' 4"  (1.626 m)    Weight:   Wt Readings from Last 1 Encounters:  11/02/20 62.5 kg    BMI:   Body mass index is 23.65 kg/m.  Estimated Nutritional Needs:   Kcal:  1600-1800  Protein:  75-85g  Fluid:  1.8L/day   Clayton Bibles, MS, RD, LDN Inpatient Clinical Dietitian Contact information available via Amion

## 2020-11-02 NOTE — H&P (Signed)
History and Physical    [name] SWN:[id number] DOB: [birth year] DOA: 11/01/2020  PCP: Seward Carol, MD   Patient coming from: Home via [place]  Chief Complaint: abnormal labs  HPI: [name] is a 75 y.o. female with medical history significant for coronary artery disease, chronic diastolic CHF, right breast cancer (02/2013) post right lumpectomy, on breast cancer surveillance, followed by Dr. Burr Medico, left renal vein thrombosis (07/2017), right lower extremity DVT on Eliquis, CKD 4, gout, essential hypertension, type 2 diabetes, GERD, chronic anxiety/depression, unintentional weight loss, mild chronic gastritis and reactive gastropathy (EGD 09/08/2020), iron deficiency anemia (iron injection), anemia of chronic disease, spinal stenosis, history of cardiac arrest in May 2019 who presented to [facility] ER after being called by her PCP that her renal function labs declined and she should come to the emergency room.  Our nephrologist on July 26 and at that time her diuretic dose was decreased.  She went to the PCP yesterday for routine blood work and follow-up.  She reports she has had a poor appetite for the last month and has had decreased smell and taste sensation for the last month.  She states she has not had any fevers or chills.  She states that she had a fall when she lost her balance a few days ago but did not have any head injury or loss of consciousness and did not suffer any injury other than a bruise on her arm.  She states she has not had any coughing or shortness of breath.  She denies any chest pain or palpitations.  ED Course: Ms. Name was hemodynamically stable in the emergency room with a mildly elevated blood pressure.  She was found of a creatinine of 4.47 from a baseline of 2.9.  She is still able to make urine at her normal rate she states.  She does not have any fever, chills or shortness of breath.  She was found to be COVID-positive but is asymptomatic  except for losing her taste and smell a month ago.  She was sent to [facility] for admission with her AKI on CKD 4.  Labs show WBC of 9300 and hemoglobin 10.9 hematocrit 33.9 platelets 208,000 sodium 139 potassium 4.5 chloride 106 bicarb 19 creatinine 4.47 BUN 56 alkaline phosphatase 79 AST 22 ALT 32 calcium 10.0, urinalysis negative, COVID-19 swab was positive.  Chest x-ray negative.  Hospitalist service admitted patient for further management  Review of Systems:  General: Reports decrease in smell and taste. Denies fever, chills, weight loss, night sweats. Denies dizziness. Has decreased appetite HENT: Denies head trauma, headache, denies change in hearing, tinnitus. Denies nasal congestion.  Denies sore throat.  Denies difficulty swallowing Eyes: Denies blurry vision, pain in eye, drainage.  Denies discoloration of eyes. Neck: Denies pain.  Denies swelling.  Denies pain with movement. Cardiovascular: Denies chest pain, palpitations.  Denies edema.  Denies orthopnea Respiratory: Denies shortness of breath, cough.  Denies wheezing.  Denies sputum production Gastrointestinal: Denies abdominal pain, swelling.  Denies nausea, vomiting, diarrhea.  Denies melena.  Denies hematemesis. Musculoskeletal: Denies limitation of movement.  Denies deformity or swelling.  Denies pain.  Denies arthralgias or myalgias. Genitourinary: Denies pelvic pain.  Denies urinary frequency or hesitancy.  Denies dysuria.  Skin: Denies rash.  Denies petechiae, purpura, ecchymosis. Neurological: Denies headache.  Denies syncope.  Denies seizure activity.  Denies weakness or paresthesia.  Denies slurred speech, drooping face.  Denies visual change. Psychiatric: Denies depression, anxiety.  Denies hallucinations.  Past Medical History:  Diagnosis Date   Abdominal discomfort    Chronic N/V/D. Presumptive dx Crohn's dx per elevated p ANCA. Failed Entocort and Pentasa. Sep 2003 - ileocolectomy c anastomosis per Dr Deon Pilling  2/2 adhesions - path was hegative for Crohns. EGD, Sm bowel follow through (11/03), and an eteroclysis (10/03) were unrevealing. Cuases hypomag and hypocalcemia.   Adnexal mass 8/03   s/p lap BSO (R ovarian fibroma) & lysis of adhesions   Allergy    Seasonal   Anemia    Multifactorial. Baseline HgB 10-11 ish. B12 def - 150 in 3/10. Fe Def - ferritin 35 3/10. Both are being repleted.   Breast cancer ([place]) 03/16/13   right, 5 o'clock   CAD (coronary artery disease) 1996   1996 - PTCA and angioplasty diagonal branch. 2000 - Rotoblator & angiopllasty of diagonal. 2006 - subendocardial AMI, DES to proximal LAD.Marland Kitchen Also had 90% stenosis in distal apical LAD. EF 55 with apical hypokinesis. Indefinite ASA and Plavix.   CHF (congestive heart failure) (HCC)    Chronic kidney disease    Chronic renal insuff baseline Cr 1.2 - 1.4 ish.   Chronic pain    CT 10/10 = Spinal stenosis L2 - S1.   Diabetes mellitus    Insulin dependent   Gout    Hx of radiation therapy 06/02/13- 07/16/13   right rbeast 4500 cGy 25 sessions, right breast boost 1600 cGy in 8 sessions   Hyperlipidemia    Managed with both a statin and Welchol. Welchol stopped 2014 2/2 cost and started on fenofibrate    Hypertension    2006 B renal arteries patent. 2003 MRA - no RAS. 2003 pheo W/U Dr Hassell Done reportedly negative.   Hypoxia 07/23/2017   Lupus ([place])    Lymphedema of breast    Personal history of radiation therapy 2015   RBBB    Renal vein thrombosis (HCC)    SBO (small bowel obstruction) ([place]) 09/17/2017   Secondary hyperparathyroidism ([place])    Stroke ([place])    Incidental finding MRI 2002 L lacunar infarct   Vitamin B12 deficiency    Vitamin D deficiency    Wears dentures    top    Past Surgical History:  Procedure Laterality Date   ABDOMINAL HYSTERECTOMY     BILATERAL SALPINGOOPHORECTOMY  8/03   Lap BSO (R ovarian fibroma) and adhesion lysis   BIOPSY  09/08/2020   Procedure: BIOPSY;  Surgeon: Ronald Lobo, MD;   Location: [facility];  Service: Endoscopy;;   BOWEL RESECTION  2003   ileocolectomy with anastomosis 2/2 adhesions   BREAST BIOPSY Right 2015   BREAST BIOPSY Right 07/2014   BREAST LUMPECTOMY Right 04/22/2013   BREAST LUMPECTOMY WITH NEEDLE LOCALIZATION AND AXILLARY SENTINEL LYMPH NODE BX Right 04/22/2013   Procedure: BREAST LUMPECTOMY WITH NEEDLE LOCALIZATION AND AXILLARY SENTINEL LYMPH NODE BX;  Surgeon: Stark Klein, MD;  Location: [place];  Service: General;  Laterality: Right;   CARDIAC CATHETERIZATION     2 stents   CHOLECYSTECTOMY     COLONOSCOPY     ESOPHAGOGASTRODUODENOSCOPY N/A 09/08/2020   Procedure: ESOPHAGOGASTRODUODENOSCOPY (EGD);  Surgeon: Ronald Lobo, MD;  Location: [facility];  Service: Endoscopy;  Laterality: N/A;   HEMICOLECTOMY     R sided hemicolectomy   HERNIA REPAIR     Ventral hernia repair   PTCA  4/06    Social History  reports that she has never smoked. She has never used smokeless tobacco. She reports that she  does not drink alcohol and does not use drugs.  Allergies  Allergen Reactions   Percocet [Oxycodone-Acetaminophen] Other (See Comments)    headaches   Diazepam Other (See Comments)    REACTION: Agitation Other reaction(s): agitation   Haloperidol Lactate Nausea And Vomiting   Lorazepam Nausea Only    Other reaction(s): nausea   Morphine Sulfate Other (See Comments)    REACTION: Agitation   Propoxyphene Hcl Nausea And Vomiting   Tramadol Hcl Nausea And Vomiting   Anesthetics, Halogenated     Per patient's daughter, she can not tolerate anesthetics.    Etomidate     Heart stopped during anesthesia   Fentanyl     Heart stopped during anesthesia    Haloperidol     Other reaction(s): nausea & vomitin   Morphine Sulfate     Other reaction(s): nausea   Oxycodone-Acetaminophen     Other reaction(s): headaches   Propoxyphene     Other reaction(s): nausea & vomiting   Tramadol Hcl     Other reaction(s): nausea &  vomitin Other reaction(s): nausea & vomitin   Versed [Midazolam]     Heart stopped during anesthesia    Family History  Problem Relation Age of Onset   Pancreatic cancer Brother 21   Breast cancer Mother 47   Lung cancer Maternal Aunt    Breast cancer Maternal Aunt    Prostate cancer Maternal Uncle    Heart attack Maternal Grandmother    Breast cancer Maternal Grandmother    Colon cancer Maternal Grandmother    Clotting disorder Maternal Grandmother    Diabetes Maternal Grandmother    Kidney failure Maternal Aunt    Hypertension Daughter      Prior to Admission medications   Medication Sig Start Date End Date Taking? Authorizing Provider  acetaminophen (TYLENOL) 500 MG tablet Take 1,000 mg by mouth every 6 (six) hours as needed for moderate pain or headache.    [provider]  amLODipine (NORVASC) 10 MG tablet Take 1 tablet (10 mg total) by mouth daily. 08/22/17   Angiulli, Lavon Paganini, PA-C  apixaban (ELIQUIS) 5 MG TABS tablet Take 1 tablet (5 mg total) by mouth 2 (two) times daily. Start after you finish the starter pack 07/24/20   Eulogio Bear U, DO  budesonide (ENTOCORT EC) 3 MG 24 hr capsule Take 1 capsule by mouth once daily 10/04/20   Gatha Mayer, MD  Cyanocobalamin (B-12 COMPLIANCE INJECTION IJ) Inject 1 vial as directed every 30 (thirty) days.    [provider]  Epoetin Alfa-epbx (RETACRIT IJ) Inject 1 vial as directed every 30 (thirty) days.    [provider]  ergocalciferol (VITAMIN D2) 1.25 MG (50000 UT) capsule Take 1 capsule (50,000 Units total) by mouth once a week. 08/02/20   Truitt Merle, MD  famotidine (PEPCID) 20 MG tablet Take 1 tablet (20 mg total) by mouth at bedtime. 09/27/20   Debbe Odea, MD  FLUoxetine (PROZAC) 20 MG capsule Take 20 mg by mouth at bedtime.    [provider]  hydrALAZINE (APRESOLINE) 100 MG tablet TAKE 1 TABLET BY MOUTH 3  TIMES DAILY 10/24/20   Josue Hector, MD  isosorbide mononitrate (IMDUR) 30 MG 24 hr  tablet TAKE 1/2 (ONE-HALF) TABLET BY MOUTH ONCE DAILY (PLEASE  SCHEDULE  AN  APPOINTMENT  FOR  FURTHER  REFILLS) 07/12/20   Isaiah Serge, NP  linagliptin (TRADJENTA) 5 MG TABS tablet Take 5 mg by mouth daily.    [provider]  lipase/protease/amylase (CREON) 36000 UNITS CPEP capsule Take 36,000 Units by mouth 3 (three) times daily with meals.    [provider]  loperamide (IMODIUM A-D) 2 MG tablet Take 4 mg by mouth 4 (four) times daily as needed for diarrhea or loose stools.    [provider]  Melatonin 5 MG TABS Take 10 mg by mouth at bedtime.    [provider]  nebivolol (BYSTOLIC) 10 MG tablet Take 1 tablet (10 mg total) by mouth daily. 06/30/20   Mercy Riding, MD  nitroGLYCERIN (NITROSTAT) 0.4 MG SL tablet DISSOLVE 1 TABLET UNDER THE TONGUE EVERY 5 MINUTES AS  NEEDED FOR CHEST PAIN. MAX  OF 3 TABLETS IN 15 MINUTES. CALL 911 IF PAIN PERSISTS. 10/04/20   Jerline Pain, MD  ondansetron (ZOFRAN ODT) 4 MG disintegrating tablet Take 1 tablet (4 mg total) by mouth every 8 (eight) hours as needed for nausea or vomiting. 08/17/20   Walisiewicz, Verline Lema E, PA-C  pantoprazole (PROTONIX) 40 MG tablet TAKE 1 TABLET BY MOUTH  DAILY BEFORE BREAKFAST 10/24/20   Gatha Mayer, MD  pregabalin (LYRICA) 25 MG capsule Take 1 capsule (25 mg total) by mouth 2 (two) times daily. 07/19/20   Kirsteins, Luanna Salk, MD  sucralfate (CARAFATE) 1 g tablet Take 1 g by mouth 4 (four) times daily -  with meals and at bedtime.    [provider]  torsemide (DEMADEX) 20 MG tablet Take 1 tablet (20 mg total) by mouth daily. 09/10/20   Oswald Hillock, MD    Physical Exam: Vitals:   11/02/20 0215 11/02/20 0245 11/02/20 0331 11/02/20 0337  BP: (!) 141/73 (!) 159/71 (!) 163/86   Pulse: 73 74 80   Resp: 16 (!) 9 14   Temp:   97.9 F (36.6 C)   TempSrc:   Oral   SpO2: 97% 99% 100%   Weight:    62.5 kg  Height:    5' 4"  (1.626 m)    Constitutional: NAD, calm, comfortable Vitals:    11/02/20 0215 11/02/20 0245 11/02/20 0331 11/02/20 0337  BP: (!) 141/73 (!) 159/71 (!) 163/86   Pulse: 73 74 80   Resp: 16 (!) 9 14   Temp:   97.9 F (36.6 C)   TempSrc:   Oral   SpO2: 97% 99% 100%   Weight:    62.5 kg  Height:    5' 4"  (1.626 m)   General: WDWN, Alert and oriented x3.  Eyes: EOMI, PERRL, conjunctivae normal.  Sclera nonicteric HENT:  [place]/AT, external ears normal.  Nares patent without epistasis.  Mucous membranes are dry. Oropharynx without lesions Neck: Soft, normal range of motion, supple, no masses, no thyromegaly. Trachea midline Respiratory: clear to auscultation bilaterally, no wheezing, no crackles. Normal respiratory effort. No accessory muscle use.  Cardiovascular: Regular rate and rhythm, no murmurs / rubs / gallops. No extremity edema. 2+ pedal pulses. No carotid bruits.  Abdomen: Soft, no tenderness, nondistended, no rebound or guarding.  No masses palpated. Bowel sounds normoactive Musculoskeletal: FROM. no cyanosis. No joint deformity upper and lower extremities. Normal muscle tone.  Skin: Warm, dry, intact no rashes, lesions, ulcers. No induration Neurologic: CN 2-12 grossly intact.  Normal speech.  Sensation intact to touch. Strength 5/5 in all extremities.   Psychiatric: Normal judgment and insight.  Normal mood.    Labs on Admission: I have personally reviewed following labs and imaging studies  CBC: Recent Labs  Lab 11/01/20 2124  WBC  9.3  NEUTROABS 7.1  HGB 10.9*  HCT 33.9*  MCV 82.9  PLT 364    Basic Metabolic Panel: Recent Labs  Lab 11/01/20 2124  NA 139  K 4.5  CL 106  CO2 19*  GLUCOSE 108*  BUN 56*  CREATININE 4.47*  CALCIUM 10.0    GFR: Estimated Creatinine Clearance: 9.7 mL/min (A) (by C-G formula based on SCr of 4.47 mg/dL (H)).  Liver Function Tests: Recent Labs  Lab 11/01/20 2124  AST 22  ALT 32  ALKPHOS 79  BILITOT 0.4  PROT 7.0  ALBUMIN 3.7    Urine analysis:    Component Value Date/Time   COLORURINE  COLORLESS (A) 11/02/2020 0027   APPEARANCEUR CLEAR 11/02/2020 0027   APPEARANCEUR Clear 08/22/2018 1620   LABSPEC 1.009 11/02/2020 0027   LABSPEC 1.020 03/03/2014 1341   PHURINE 5.0 11/02/2020 0027   GLUCOSEU NEGATIVE 11/02/2020 0027   GLUCOSEU Negative 03/03/2014 1341   HGBUR NEGATIVE 11/02/2020 0027   BILIRUBINUR NEGATIVE 11/02/2020 0027   BILIRUBINUR Negative 08/22/2018 1620   BILIRUBINUR Negative 03/03/2014 1341   KETONESUR NEGATIVE 11/02/2020 0027   PROTEINUR TRACE (A) 11/02/2020 0027   UROBILINOGEN 0.2 03/03/2014 1341   NITRITE NEGATIVE 11/02/2020 0027   LEUKOCYTESUR NEGATIVE 11/02/2020 0027   LEUKOCYTESUR Negative 03/03/2014 1341    Radiological Exams on Admission: DG Chest Portable 1 View  Result Date: 11/01/2020 CLINICAL DATA:  Weakness EXAM: PORTABLE CHEST 1 VIEW COMPARISON:  07/20/2020 FINDINGS: Lungs are clear.  No pleural effusion or pneumothorax. The heart is normal in size. Surgical clips along the right lateral chest wall/axilla. Surgical clips in the right upper abdomen. IMPRESSION: No evidence of acute cardiopulmonary disease. Electronically Signed   By: Julian Hy M.D.   On: 11/01/2020 21:02    EKG: Independently reviewed.  EKG shows normal sinus rhythm with right bundle branch block.  No acute ST elevation or depression.  QTc 491  Assessment/Plan Principal Problem:   Acute renal failure superimposed on stage 4 chronic kidney disease Ms. Baroni was admitted to medical telemetry floor.  Has increase in creatinine from 2.9-4.47. Gentle IV fluid hydration with LR at 50 ml/hr.  Recheck electrolytes and renal function in am Nephrology will be consulted this morning  Active Problems:   Type 2 diabetes mellitus with peripheral neuropathy  Continue Tradjenta.  Home medication to be verified and reconciled.  Monitor blood sugars    Essential hypertension Home medications to be verified and reconciled by pharmacy and then resumed.  Monitor blood pressure     CAD (coronary artery disease) Home medications will be verified and reconciled by pharmacy and then resumed.    COVID-19 Pt has no symptoms of Covid and was surprised her test came back positive. On questioning she does admit that she developed loss of smell and taste a month ago and it is not back to normal yet. Checked with lab and the Covid Control time is 32.1 which indicates that infection is not acute. Therefore will not treat with remdisivir as most likely has had infection for past month and airborne precautions can be stopped.     DVT prophylaxis: Heparin for DVT prophylaxis.   Code Status:   Full Code  Family Communication:  Diagnosis and plan discussed with patient.  She verbalized understanding agrees with plan.  Further recommendations to follow as clinical indicated Disposition Plan:   Patient is from:  Home  Anticipated DC to:  Home  Anticipated DC date:  Anticipate 2 midnight stay  Consults  called:  Nephrology will need to be called in am  Admission status:  Inpatient   Yevonne Aline Alara Daniel MD [facility]  How to contact the [facility] Attending or Consulting provider [place] or covering provider during after hours [place], for this patient?   Check the care team in [facility] and look for a) attending/consulting [facility] provider listed and b) the [facility] team listed Log into [email or web address] and use [place]'s universal password to access. If you do not have the password, please contact the hospital operator. Locate the [facility] provider you are looking for under [facility] and page to a number that you can be directly reached. If you still have difficulty reaching the provider, please page the [facility] (Director on Call) for the Hospitalists listed on amion for assistance.  11/02/2020, 5:23 AM

## 2020-11-02 NOTE — Consult Note (Signed)
Reason for Consult: Acute kidney injury on chronic kidney disease stage IV Referring Physician: Cordelia Poche MD [facility])  HPI: 75 year old African-American woman with past medical history significant for coronary artery disease status post PTCA, hypertension, dyslipidemia, history of congestive heart failure with preserved ejection fraction (55 to 60%), breast cancer status post right lumpectomy/radiation and previously on anastrozole, gout, history of small bowel obstruction and chronic kidney disease stage IV with baseline creatinine that appears to be now 2.5-3.0 following episodes of recurrent acute kidney injury.  She was last seen by Dr. Hollie Salk on 10/12/2020 at [facility] for ongoing care of her chronic kidney disease and diuretic therapy changed from torsemide 20 mg daily to furosemide 40 mg daily.  She was directed to the emergency room yesterday for management of labs showing creatinine of 4.9 up from 3.0 at Dr. Lina Sar office.  She has had decreasing appetite over the past 4 weeks with concomitant weight loss of about 20 pounds and reports no associated nausea, vomiting or diarrhea.  She reports fluid intake of about 40 ounces a day with continued diuretic use.  She denies any chest pain or shortness of breath and has mild left ankle swelling that appears chronic.  She denies any dysuria, urgency, frequency, flank pain, fever, chills or hematuria.  She denies taking any nonsteroidal anti-inflammatory drugs including meloxicam prior to admission.  She denies any dysgeusia or abnormal limb jerking movements.  Urine output not charted from overnight and labs here in the emergency room yesterday showed creatinine of 4.5.  She had 1 L normal saline bolus overnight.  Past Medical History:  Diagnosis Date   Abdominal discomfort    Chronic N/V/D. Presumptive dx Crohn's dx per elevated p ANCA. Failed Entocort and Pentasa. Sep 2003 - ileocolectomy c anastomosis per Dr Deon Pilling 2/2 adhesions - path was  hegative for Crohns. EGD, Sm bowel follow through (11/03), and an eteroclysis (10/03) were unrevealing. Cuases hypomag and hypocalcemia.   Adnexal mass 8/03   s/p lap BSO (R ovarian fibroma) & lysis of adhesions   Allergy    Seasonal   Anemia    Multifactorial. Baseline HgB 10-11 ish. B12 def - 150 in 3/10. Fe Def - ferritin 35 3/10. Both are being repleted.   Breast cancer ([place]) 03/16/13   right, 5 o'clock   CAD (coronary artery disease) 1996   1996 - PTCA and angioplasty diagonal branch. 2000 - Rotoblator & angiopllasty of diagonal. 2006 - subendocardial AMI, DES to proximal LAD.Marland Kitchen Also had 90% stenosis in distal apical LAD. EF 55 with apical hypokinesis. Indefinite ASA and Plavix.   CHF (congestive heart failure) (HCC)    Chronic kidney disease    Chronic renal insuff baseline Cr 1.2 - 1.4 ish.   Chronic pain    CT 10/10 = Spinal stenosis L2 - S1.   Diabetes mellitus    Insulin dependent   Gout    Hx of radiation therapy 06/02/13- 07/16/13   right rbeast 4500 cGy 25 sessions, right breast boost 1600 cGy in 8 sessions   Hyperlipidemia    Managed with both a statin and Welchol. Welchol stopped 2014 2/2 cost and started on fenofibrate    Hypertension    2006 B renal arteries patent. 2003 MRA - no RAS. 2003 pheo W/U Dr Hassell Done reportedly negative.   Hypoxia 07/23/2017   Lupus ([place])    Lymphedema of breast    Personal history of radiation therapy 2015   RBBB    Renal vein thrombosis ([place])  SBO (small bowel obstruction) ([place]) 09/17/2017   Secondary hyperparathyroidism ([place])    Stroke [facility])    Incidental finding MRI 2002 L lacunar infarct   Vitamin B12 deficiency    Vitamin D deficiency    Wears dentures    top    Past Surgical History:  Procedure Laterality Date   ABDOMINAL HYSTERECTOMY     BILATERAL SALPINGOOPHORECTOMY  8/03   Lap BSO (R ovarian fibroma) and adhesion lysis   BIOPSY  09/08/2020   Procedure: BIOPSY;  Surgeon: Ronald Lobo, MD;  Location: [facility];   Service: Endoscopy;;   BOWEL RESECTION  2003   ileocolectomy with anastomosis 2/2 adhesions   BREAST BIOPSY Right 2015   BREAST BIOPSY Right 07/2014   BREAST LUMPECTOMY Right 04/22/2013   BREAST LUMPECTOMY WITH NEEDLE LOCALIZATION AND AXILLARY SENTINEL LYMPH NODE BX Right 04/22/2013   Procedure: BREAST LUMPECTOMY WITH NEEDLE LOCALIZATION AND AXILLARY SENTINEL LYMPH NODE BX;  Surgeon: Stark Klein, MD;  Location: [place];  Service: General;  Laterality: Right;   CARDIAC CATHETERIZATION     2 stents   CHOLECYSTECTOMY     COLONOSCOPY     ESOPHAGOGASTRODUODENOSCOPY N/A 09/08/2020   Procedure: ESOPHAGOGASTRODUODENOSCOPY (EGD);  Surgeon: Ronald Lobo, MD;  Location: [facility];  Service: Endoscopy;  Laterality: N/A;   HEMICOLECTOMY     R sided hemicolectomy   HERNIA REPAIR     Ventral hernia repair   PTCA  4/06    Family History  Problem Relation Age of Onset   Pancreatic cancer Brother 9   Breast cancer Mother 19   Lung cancer Maternal Aunt    Breast cancer Maternal Aunt    Prostate cancer Maternal Uncle    Heart attack Maternal Grandmother    Breast cancer Maternal Grandmother    Colon cancer Maternal Grandmother    Clotting disorder Maternal Grandmother    Diabetes Maternal Grandmother    Kidney failure Maternal Aunt    Hypertension Daughter     Social History:  reports that she has never smoked. She has never used smokeless tobacco. She reports that she does not drink alcohol and does not use drugs.  Allergies:  Allergies  Allergen Reactions   Percocet [Oxycodone-Acetaminophen] Other (See Comments)    headaches   Diazepam Other (See Comments)    REACTION: Agitation Other reaction(s): agitation   Haloperidol Lactate Nausea And Vomiting   Lorazepam Nausea Only    Other reaction(s): nausea   Morphine Sulfate Other (See Comments)    REACTION: Agitation   Propoxyphene Hcl Nausea And Vomiting   Tramadol Hcl Nausea And Vomiting   Anesthetics,  Halogenated     Per patient's daughter, she can not tolerate anesthetics.    Etomidate     Heart stopped during anesthesia   Fentanyl     Heart stopped during anesthesia    Haloperidol     Other reaction(s): nausea & vomitin   Morphine Sulfate     Other reaction(s): nausea   Oxycodone-Acetaminophen     Other reaction(s): headaches   Propoxyphene     Other reaction(s): nausea & vomiting   Tramadol Hcl     Other reaction(s): nausea & vomitin Other reaction(s): nausea & vomitin   Versed [Midazolam]     Heart stopped during anesthesia    Medications: I have reviewed the patient's current medications. Scheduled:  amLODipine  10 mg Oral Daily   FLUoxetine  20 mg Oral QHS   heparin  5,000 Units Subcutaneous Q8H   hydrALAZINE  100 mg Oral Q8H   isosorbide mononitrate  15 mg Oral Daily   linagliptin  5 mg Oral Daily   melatonin  10 mg Oral QHS   nebivolol  10 mg Oral Daily    CBC Latest Ref Rng & Units 11/01/2020 10/07/2020 09/27/2020  WBC 4.0 - 10.5 K/uL 9.3 6.5 7.3  Hemoglobin 12.0 - 15.0 g/dL 10.9(L) 8.6(L) 9.0(L)  Hematocrit 36.0 - 46.0 % 33.9(L) 27.5(L) 28.8(L)  Platelets 150 - 400 K/uL 208 178 226   BMP Latest Ref Rng & Units 11/01/2020 09/27/2020 09/26/2020  Glucose 70 - 99 mg/dL 108(H) 140(H) 121(H)  BUN 8 - 23 mg/dL 56(H) 41(H) 40(H)  Creatinine 0.44 - 1.00 mg/dL 4.47(H) 3.04(H) 2.92(H)  BUN/Creat Ratio 12 - 28 - - -  Sodium 135 - 145 mmol/L 139 137 136  Potassium 3.5 - 5.1 mmol/L 4.5 4.1 4.0  Chloride 98 - 111 mmol/L 106 98 97(L)  CO2 22 - 32 mmol/L 19(L) 34(H) 31  Calcium 8.9 - 10.3 mg/dL 10.0 8.5(L) 8.6(L)   Urinalysis    Component Value Date/Time   COLORURINE COLORLESS (A) 11/02/2020 0027   APPEARANCEUR CLEAR 11/02/2020 0027   APPEARANCEUR Clear 08/22/2018 1620   LABSPEC 1.009 11/02/2020 0027   LABSPEC 1.020 03/03/2014 1341   PHURINE 5.0 11/02/2020 0027   GLUCOSEU NEGATIVE 11/02/2020 0027   GLUCOSEU Negative 03/03/2014 1341   HGBUR NEGATIVE 11/02/2020 0027    BILIRUBINUR NEGATIVE 11/02/2020 0027   BILIRUBINUR Negative 08/22/2018 1620   BILIRUBINUR Negative 03/03/2014 1341   KETONESUR NEGATIVE 11/02/2020 0027   PROTEINUR TRACE (A) 11/02/2020 0027   UROBILINOGEN 0.2 03/03/2014 1341   NITRITE NEGATIVE 11/02/2020 0027   LEUKOCYTESUR NEGATIVE 11/02/2020 0027   LEUKOCYTESUR Negative 03/03/2014 1341      DG Chest Portable 1 View  Result Date: 11/01/2020 CLINICAL DATA:  Weakness EXAM: PORTABLE CHEST 1 VIEW COMPARISON:  07/20/2020 FINDINGS: Lungs are clear.  No pleural effusion or pneumothorax. The heart is normal in size. Surgical clips along the right lateral chest wall/axilla. Surgical clips in the right upper abdomen. IMPRESSION: No evidence of acute cardiopulmonary disease. Electronically Signed   By: Julian Hy M.D.   On: 11/01/2020 21:02    Review of Systems  Constitutional:  Positive for appetite change and fatigue. Negative for chills and fever.  HENT:  Negative for ear pain, mouth sores, nosebleeds and sore throat.   Eyes:  Negative for photophobia, redness and visual disturbance.  Respiratory:  Negative for cough, chest tightness and shortness of breath.   Cardiovascular:  Negative for chest pain and leg swelling.  Gastrointestinal:  Negative for abdominal pain, blood in stool, diarrhea, nausea and vomiting.  Genitourinary:  Negative for dysuria, flank pain, frequency and hematuria.  Musculoskeletal:  Negative for back pain and myalgias.  Skin:  Negative for pallor, rash and wound.  Neurological:  Positive for weakness. Negative for light-headedness and headaches.  Blood pressure 127/70, pulse 83, temperature 98 F (36.7 C), temperature source Oral, resp. rate 16, height 5' 4"  (1.626 m), weight 62.5 kg, SpO2 100 %. Physical Exam Constitutional:      General: She is not in acute distress.    Appearance: Normal appearance. She is normal weight. She is not ill-appearing.  HENT:     Head: Normocephalic and atraumatic.     Right  Ear: External ear normal.     Left Ear: External ear normal.     Nose: Nose normal.     Mouth/Throat:     Mouth: Mucous  membranes are dry.     Pharynx: Oropharynx is clear.  Eyes:     General: No scleral icterus.    Extraocular Movements: Extraocular movements intact.     Conjunctiva/sclera: Conjunctivae normal.  Cardiovascular:     Rate and Rhythm: Normal rate and regular rhythm.     Heart sounds: Normal heart sounds. No murmur heard. Pulmonary:     Effort: Pulmonary effort is normal.     Breath sounds: Normal breath sounds. No wheezing or rales.  Abdominal:     General: Abdomen is flat. Bowel sounds are normal.     Palpations: Abdomen is soft.     Tenderness: There is no abdominal tenderness. There is no guarding.  Musculoskeletal:     Cervical back: Normal range of motion and neck supple.     Right lower leg: No edema.     Left lower leg: Edema present.     Comments: Trace/nonpitting  Skin:    General: Skin is warm and dry.     Findings: No lesion.  Neurological:     General: No focal deficit present.     Mental Status: She is alert and oriented to person, place, and time.  Psychiatric:        Mood and Affect: Mood normal.    Assessment/Plan: 1.  Acute kidney injury on chronic kidney disease stage IV: Suspected to be hemodynamically mediated acute kidney injury with an unremarkable urine dipstick analysis.  Clinical exam/history suggests prerenal azotemia and I agree with holding furosemide at this time and undertaking cautious intravascular volume expansion with lactated Ringers; will repeat labs this morning.  She does not have any acute electrolyte abnormality or indications for dialysis at this time. Avoid nephrotoxic medications including NSAIDs and iodinated intravenous contrast exposure unless the latter is absolutely indicated.  Preferred narcotic agents for pain control are hydromorphone, fentanyl, and methadone. Morphine should not be used. Avoid Baclofen and avoid  oral sodium phosphate and magnesium citrate based laxatives / bowel preps. Continue strict Input and Output monitoring. Will monitor the patient closely with you and intervene or adjust therapy as indicated by changes in clinical status/labs. 2.  Hypertension: Blood pressures under decent control, continue to hold off of diuretic and monitor with volume expansion. 3.  Anemia of chronic kidney disease: Improvement of hemoglobin and hematocrit noted over the last month is likely reflective of hemoconcentration from volume depletion rather than de novo erythropoiesis.  I will follow trend with volume expansion and she may need outpatient ESA/IV iron. 4.  Anion gap metabolic acidosis: Secondary to acute kidney injury, monitor with volume expansion/lactated Ringer's. 5.  Congestive heart failure with preserved ejection fraction: Appears compensated and will be monitored closely on intravenous fluids.  Jeriyah Granlund K. 11/02/2020, 11:00 AM

## 2020-11-02 NOTE — Plan of Care (Signed)

## 2020-11-02 NOTE — ED Notes (Signed)
Pt went to the bathroom via WC .

## 2020-11-02 NOTE — ED Notes (Signed)
Report given to  [facility] and RN to [facility] room

## 2020-11-03 DIAGNOSIS — E1142 Type 2 diabetes mellitus with diabetic polyneuropathy: Secondary | ICD-10-CM

## 2020-11-03 LAB — RENAL FUNCTION PANEL
Albumin: 2.6 g/dL — ABNORMAL LOW (ref 3.5–5.0)
Anion gap: 7 (ref 5–15)
BUN: 45 mg/dL — ABNORMAL HIGH (ref 8–23)
CO2: 21 mmol/L — ABNORMAL LOW (ref 22–32)
Calcium: 8.9 mg/dL (ref 8.9–10.3)
Chloride: 110 mmol/L (ref 98–111)
Creatinine, Ser: 3.53 mg/dL — ABNORMAL HIGH (ref 0.44–1.00)
GFR, Estimated: 13 mL/min — ABNORMAL LOW (ref >60)
Glucose, Bld: 87 mg/dL (ref 70–99)
Phosphorus: 3.4 mg/dL (ref 2.5–4.6)
Potassium: 4 mmol/L (ref 3.5–5.1)
Sodium: 138 mmol/L (ref 135–145)

## 2020-11-03 LAB — CBC
HCT: 29.1 % — ABNORMAL LOW (ref 36.0–46.0)
Hemoglobin: 9.4 g/dL — ABNORMAL LOW (ref 12.0–15.0)
MCH: 27 pg (ref 26.0–34.0)
MCHC: 32.3 g/dL (ref 30.0–36.0)
MCV: 83.6 fL (ref 80.0–100.0)
Platelets: 167 10*3/uL (ref 150–400)
RBC: 3.48 MIL/uL — ABNORMAL LOW (ref 3.87–5.11)
RDW: 16.1 % — ABNORMAL HIGH (ref 11.5–15.5)
WBC: 6.1 10*3/uL (ref 4.0–10.5)
nRBC: 0 % (ref 0.0–0.2)

## 2020-11-03 NOTE — Progress Notes (Signed)
PROGRESS NOTE    [name]  CXK:[id number] DOB: Jan 31, 1946 DOA: 11/01/2020 PCP: Seward Carol, MD   Brief Narrative: [name] is a 75 y.o. female with a history of CAD, chronic diastolic heart failure, breast cancer status post right lumpectomy currently in remission, right lower extremity DVT on Eliquis, CKD stage IV, gout, hypertension, diabetes mellitus type 2, GERD, anxiety, depression, gastritis.  Patient presented secondary to abnormal lab significant for AKI on CKD.  IV fluids started.   Assessment & Plan:   Principal Problem:   Acute renal failure superimposed on stage 4 chronic kidney disease (HCC) Active Problems:   CAD (coronary artery disease)   Type 2 diabetes mellitus with peripheral neuropathy ([place])   Essential hypertension   COVID-19   AKI on CKD stage IV Baseline creatinine difficult to ascertain but appears to be around 3.  Patient presented with a creatinine of 4.47. Nephrology consulted on admission. Creatinine improving with IV fluids. -Nephrology recommendations: IV fluids; pending today  COVID-19 infection Patient with symptoms of ageusia and anosmia which developed prior to discharge (09/27/2020) from recent admission about 1 month prior.  Currently asymptomatic.  Since symptoms started about 1 month prior, no isolation was initiated.  Chronic anemia Anemia of chronic kidney disease Iron deficiency anemia Stable/slightly improved.  Nephrology planning to trend with volume expansion.  Recommending likely outpatient ESA/IV iron. Patient receives epoetin infusions per oncology.  Diabetes mellitus, type 2 Patient is on [place] as an outpatient.  Hemoglobin A1c of 5.5% from June 2022 which is well controlled. -Continue Tradjenta  Primary hypertension -Continue Imdur, Bystolic, amlodipine, hydralazine  Crohn's disease Chronic gastritis -Continue home Pepcid, Creon, Protonix, budesonide, sucralfate  History of right peroneal vein  DVT Previously on Eliquis. Planned end date of July 14th.  History of breast cancer In remission. Patient follows with oncology  DVT prophylaxis: Eliquis Code Status:   Code Status: Full Code Family Communication: None at bedside Disposition Plan: Discharge home likely in 1 day pending improvement of AKI in addition to nephrology recommendations   Consultants:  Nephrology  Procedures:  None  Antimicrobials: None    Subjective: No issues overnight.  Objective: Vitals:   11/02/20 2140 11/03/20 0457 11/03/20 0637 11/03/20 0700  BP: 133/64 129/80 (!) 146/66 124/72  Pulse:  79  87  Resp:  15  17  Temp:  98.4 F (36.9 C)  97.9 F (36.6 C)  TempSrc:  Oral  Oral  SpO2:  100%    Weight:  63.5 kg    Height:        Intake/Output Summary (Last 24 hours) at 11/03/2020 1031 Last data filed at 11/03/2020 0400 Gross per 24 hour  Intake 1820.66 ml  Output --  Net 1820.66 ml    Filed Weights   11/01/20 2033 11/02/20 0337 11/03/20 0457  Weight: 61.7 kg 62.5 kg 63.5 kg    Examination:  General exam: Appears calm and comfortable Respiratory system: Clear to auscultation. Respiratory effort normal. Cardiovascular system: S1 & S2 heard, RRR. No murmurs, rubs, gallops or clicks. Gastrointestinal system: Abdomen is nondistended, soft and nontender. No organomegaly or masses felt. Normal bowel sounds heard. Central nervous system: Alert and oriented. No focal neurological deficits. Musculoskeletal: No edema. No calf tenderness Skin: No cyanosis. No rashes Psychiatry: Judgement and insight appear normal. Mood & affect appropriate.     Data Reviewed: I have personally reviewed following labs and imaging studies  CBC Lab Results  Component Value Date   WBC 6.1 11/03/2020  RBC 3.48 (L) 11/03/2020   HGB 9.4 (L) 11/03/2020   HCT 29.1 (L) 11/03/2020   MCV 83.6 11/03/2020   MCH 27.0 11/03/2020   PLT 167 11/03/2020   MCHC 32.3 11/03/2020   RDW 16.1 (H) 11/03/2020    LYMPHSABS 1.3 11/01/2020   MONOABS 0.8 11/01/2020   EOSABS 0.0 11/01/2020   BASOSABS 0.0 56/31/4970     Last metabolic panel Lab Results  Component Value Date   NA 138 11/03/2020   K 4.0 11/03/2020   CL 110 11/03/2020   CO2 21 (L) 11/03/2020   BUN 45 (H) 11/03/2020   CREATININE 3.53 (H) 11/03/2020   GLUCOSE 87 11/03/2020   GFRNONAA 13 (L) 11/03/2020   GFRAA 21 (L) 12/21/2019   CALCIUM 8.9 11/03/2020   PHOS 3.4 11/03/2020   PROT 7.0 11/01/2020   ALBUMIN 2.6 (L) 11/03/2020   LABGLOB 2.9 07/21/2019   AGRATIO 1.1 (L) 02/02/2016   BILITOT 0.4 11/01/2020   ALKPHOS 79 11/01/2020   AST 22 11/01/2020   ALT 32 11/01/2020   ANIONGAP 7 11/03/2020    CBG (last 3)  No results for input(s): GLUCAP in the last 72 hours.   GFR: Estimated Creatinine Clearance: 12.3 mL/min (A) (by C-G formula based on SCr of 3.53 mg/dL (H)).  Coagulation Profile: No results for input(s): INR, PROTIME in the last 168 hours.  Recent Results (from the past 240 hour(s))  Resp Panel by RT-PCR (Flu A&B, Covid) Nasopharyngeal Swab     Status: Abnormal   Collection Time: 11/01/20  9:24 PM   Specimen: Nasopharyngeal Swab; Nasopharyngeal(NP) swabs in vial transport medium  Result Value Ref Range Status   SARS Coronavirus 2 by RT PCR POSITIVE (A) NEGATIVE Final    Comment: RESULT CALLED TO, READ BACK BY AND VERIFIED WITH: RN L. VENEGAS 11/01/20 AT 2239 CH (NOTE) SARS-CoV-2 target nucleic acids are DETECTED.  The SARS-CoV-2 RNA is generally detectable in upper respiratory specimens during the acute phase of infection. Positive results are indicative of the presence of the identified virus, but do not rule out bacterial infection or co-infection with other pathogens not detected by the test. Clinical correlation with patient history and other diagnostic information is necessary to determine patient infection status. The expected result is Negative.  Fact Sheet for  Patients: EntrepreneurPulse.com.au  Fact Sheet for Healthcare Providers: IncredibleEmployment.be  This test is not yet approved or cleared by the [place] FDA and  has been authorized for detection and/or diagnosis of SARS-CoV-2 by FDA under an Emergency Use Authorization (EUA).  This EUA will remain in effect (meaning this test can be  used) for the duration of  the COVID-19 declaration under Section 564(b)(1) of the Act, 21 U.S.C. section 360bbb-3(b)(1), unless the authorization is terminated or revoked sooner.     Influenza A by PCR NEGATIVE NEGATIVE Final   Influenza B by PCR NEGATIVE NEGATIVE Final    Comment: (NOTE) The Xpert Xpress SARS-CoV-2/FLU/RSV plus assay is intended as an aid in the diagnosis of influenza from Nasopharyngeal swab specimens and should not be used as a sole basis for treatment. Nasal washings and aspirates are unacceptable for Xpert Xpress SARS-CoV-2/FLU/RSV testing.  Fact Sheet for Patients: EntrepreneurPulse.com.au  Fact Sheet for Healthcare Providers: IncredibleEmployment.be  This test is not yet approved or cleared by the [place] FDA and has been authorized for detection and/or diagnosis of SARS-CoV-2 by FDA under an Emergency Use Authorization (EUA). This EUA will remain in effect (meaning this test can be used) for the  duration of the COVID-19 declaration under Section 564(b)(1) of the Act, 21 U.S.C. section 360bbb-3(b)(1), unless the authorization is terminated or revoked.  Performed at [facility], [address] [place], [place] [address]         Radiology Studies: DG Chest Portable 1 View  Result Date: 11/01/2020 CLINICAL DATA:  Weakness EXAM: PORTABLE CHEST 1 VIEW COMPARISON:  07/20/2020 FINDINGS: Lungs are clear.  No pleural effusion or pneumothorax. The heart is normal in size. Surgical clips along the right lateral chest  wall/axilla. Surgical clips in the right upper abdomen. IMPRESSION: No evidence of acute cardiopulmonary disease. Electronically Signed   By: Julian Hy M.D.   On: 11/01/2020 21:02        Scheduled Meds:  amLODipine  10 mg Oral Daily   apixaban  5 mg Oral BID   budesonide  3 mg Oral Daily   famotidine  20 mg Oral QHS   feeding supplement  237 mL Oral BID BM   FLUoxetine  20 mg Oral QHS   hydrALAZINE  100 mg Oral Q8H   isosorbide mononitrate  15 mg Oral Daily   linagliptin  5 mg Oral Daily   lipase/protease/amylase  36,000 Units Oral TID WC   melatonin  10 mg Oral QHS   nebivolol  10 mg Oral Daily   pantoprazole  40 mg Oral QAC breakfast   sucralfate  1 g Oral TID WC & HS   Continuous Infusions:     LOS: 1 day     Cordelia Poche, MD [facility] 11/03/2020, 10:31 AM  If 7PM-7AM, please contact night-coverage [email or web address]

## 2020-11-03 NOTE — Progress Notes (Signed)
Patient ID: [name], female   DOB: September 02, 1945, 75 y.o.   MRN: [id number] [facility] Progress Note   Assessment/ Plan:   1.  Acute kidney injury on chronic kidney disease stage IV: Suspected to be hemodynamically mediated acute kidney injury with an unremarkable urine dipstick analysis and improvement of renal function with holding diuretics and administering intravenous fluids.  She has been off of intravenous fluids overnight with continued improvement of renal function and I would recommend an additional day of monitoring to assess oral intake and its impact on renal function.  Would not restart furosemide until 8/20 (at her home dose of 40 mg daily). 2.  Hypertension: Blood pressures under decent control, she remains off of furosemide.  Denies orthostatic dizziness. 3.  Anemia of chronic kidney disease: Lower hemoglobin and hematocrit this morning indicated that initial sample was likely reflective of hemoconcentration from volume depletion, she will be followed as an outpatient for IV iron/ESA. 4.  Anion gap metabolic acidosis: Secondary to acute kidney injury and partly improved with intravenous fluids. 5.  Congestive heart failure with preserved ejection fraction: This appears to be compensated at this time-continue to hold furosemide until 8/20.  Subjective:   Reports that she feels the same way as she did before she came in with poor appetite.   Objective:   BP (!) 122/56 (BP Location: Right Arm)   Pulse 87   Temp 98.2 F (36.8 C) (Oral)   Resp 16   Ht 5' 4"  (1.626 m)   Wt 63.5 kg   SpO2 100%   BMI 24.03 kg/m   Intake/Output Summary (Last 24 hours) at 11/03/2020 1137 Last data filed at 11/03/2020 0400 Gross per 24 hour  Intake 1820.66 ml  Output --  Net 1820.66 ml   Weight change: 1.814 kg  Physical Exam: Gen: Comfortably ambulating around room CVS: Pulse regular rhythm, normal rate, S1 and S2 normal Resp: Clear to auscultation bilaterally, no  rales/rhonchi Abd: Soft, flat, nontender, bowel sounds normal Ext: No lower extremity edema  Imaging: DG Chest Portable 1 View  Result Date: 11/01/2020 CLINICAL DATA:  Weakness EXAM: PORTABLE CHEST 1 VIEW COMPARISON:  07/20/2020 FINDINGS: Lungs are clear.  No pleural effusion or pneumothorax. The heart is normal in size. Surgical clips along the right lateral chest wall/axilla. Surgical clips in the right upper abdomen. IMPRESSION: No evidence of acute cardiopulmonary disease. Electronically Signed   By: Julian Hy M.D.   On: 11/01/2020 21:02    Labs: BMET Recent Labs  Lab 11/01/20 2124 11/02/20 1120 11/03/20 0324  NA 139 139 138  K 4.5 4.2 4.0  CL 106 107 110  CO2 19* 20* 21*  GLUCOSE 108* 100* 87  BUN 56* 49* 45*  CREATININE 4.47* 4.03* 3.53*  CALCIUM 10.0 9.6 8.9  PHOS  --   --  3.4   CBC Recent Labs  Lab 11/01/20 2124 11/03/20 0324  WBC 9.3 6.1  NEUTROABS 7.1  --   HGB 10.9* 9.4*  HCT 33.9* 29.1*  MCV 82.9 83.6  PLT 208 167   Medications:     amLODipine  10 mg Oral Daily   apixaban  5 mg Oral BID   budesonide  3 mg Oral Daily   famotidine  20 mg Oral QHS   feeding supplement  237 mL Oral BID BM   FLUoxetine  20 mg Oral QHS   hydrALAZINE  100 mg Oral Q8H   isosorbide mononitrate  15 mg Oral Daily   linagliptin  5 mg Oral Daily   lipase/protease/amylase  36,000 Units Oral TID WC   melatonin  10 mg Oral QHS   nebivolol  10 mg Oral Daily   pantoprazole  40 mg Oral QAC breakfast   sucralfate  1 g Oral TID WC & HS   Elmarie Shiley, MD 11/03/2020, 11:37 AM

## 2020-11-04 ENCOUNTER — Ambulatory Visit: Payer: Medicare Other

## 2020-11-04 ENCOUNTER — Other Ambulatory Visit: Payer: Medicare Other

## 2020-11-04 LAB — RENAL FUNCTION PANEL
Albumin: 2.7 g/dL — ABNORMAL LOW (ref 3.5–5.0)
Anion gap: 9 (ref 5–15)
BUN: 39 mg/dL — ABNORMAL HIGH (ref 8–23)
CO2: 20 mmol/L — ABNORMAL LOW (ref 22–32)
Calcium: 9 mg/dL (ref 8.9–10.3)
Chloride: 109 mmol/L (ref 98–111)
Creatinine, Ser: 3.2 mg/dL — ABNORMAL HIGH (ref 0.44–1.00)
GFR, Estimated: 15 mL/min — ABNORMAL LOW (ref >60)
Glucose, Bld: 69 mg/dL — ABNORMAL LOW (ref 70–99)
Phosphorus: 3.4 mg/dL (ref 2.5–4.6)
Potassium: 3.4 mmol/L — ABNORMAL LOW (ref 3.5–5.1)
Sodium: 138 mmol/L (ref 135–145)

## 2020-11-04 MED ORDER — FUROSEMIDE 40 MG PO TABS: 40.0000 mg | ORAL_TABLET | Freq: Every day | ORAL | Status: DC

## 2020-11-04 MED ORDER — POTASSIUM CHLORIDE CRYS ER 20 MEQ PO TBCR
40.0000 meq | EXTENDED_RELEASE_TABLET | Freq: Once | ORAL | Status: AC
  Administered 2020-11-04: 40 meq via ORAL
  Filled 2020-11-04: qty 2

## 2020-11-04 MED ORDER — ENSURE ENLIVE PO LIQD: 237.0000 mL | Freq: Two times a day (BID) | ORAL | Status: DC

## 2020-11-04 NOTE — Discharge Summary (Signed)
Physician Discharge Summary  [name] WUX:[id number] DOB: 10/01/1945 DOA: 11/01/2020  PCP: Seward Carol, MD  Admit date: 11/01/2020 Discharge date: 11/04/2020  Admitted From: Home Disposition: Home  Recommendations for Outpatient Follow-up:  Follow up with PCP in 1 week Consider depression screening Please obtain BMP/CBC in one week Please follow up on the following pending results: None  Home Health: None Equipment/Devices: None  Discharge Condition: Stable CODE STATUS: Full code Diet recommendation: Renal diet   Brief/Interim Summary:  Admission HPI written by Harrold Donath, MD   HPI: [name] is a 75 y.o. female with medical history significant for coronary artery disease, chronic diastolic CHF, right breast cancer (02/2013) post right lumpectomy, on breast cancer surveillance, followed by Dr. Burr Medico, left renal vein thrombosis (07/2017), right lower extremity DVT on Eliquis, CKD 4, gout, essential hypertension, type 2 diabetes, GERD, chronic anxiety/depression, unintentional weight loss, mild chronic gastritis and reactive gastropathy (EGD 09/08/2020), iron deficiency anemia (iron injection), anemia of chronic disease, spinal stenosis, history of cardiac arrest in May 2019 who presented to [facility] ER after being called by her PCP that her renal function labs declined and she should come to the emergency room.  Our nephrologist on July 26 and at that time her diuretic dose was decreased.  She went to the PCP yesterday for routine blood work and follow-up.  She reports she has had a poor appetite for the last month and has had decreased smell and taste sensation for the last month.  She states she has not had any fevers or chills.  She states that she had a fall when she lost her balance a few days ago but did not have any head injury or loss of consciousness and did not suffer any injury other than a bruise on her arm.  She states she has not had any coughing  or shortness of breath.  She denies any chest pain or palpitations.   Hospital course:  AKI on CKD stage IV Baseline creatinine difficult to ascertain but appears to be around 3.  Patient presented with a creatinine of 4.47. Nephrology consulted on admission. Creatinine improved with IV fluids. Nephrology with recommendation to resume lasix on 8/20. Creatinine of 3.2 on discharge.   COVID-19 infection Patient with symptoms of ageusia and anosmia which developed prior to discharge (09/27/2020) from recent admission about 1 month prior.  Currently asymptomatic.  Since symptoms started about 1 month prior, no isolation was initiated.   Chronic anemia Anemia of chronic kidney disease Iron deficiency anemia Stable/slightly improved.  Nephrology planning to trend with volume expansion.  Recommending likely outpatient ESA/IV iron. Patient receives epoetin infusions per oncology.   Diabetes mellitus, type 2 Patient is on [place] as an outpatient.  Hemoglobin A1c of 5.5% from June 2022 which is well controlled. Continue.  Primary hypertension Continue Imdur, Bystolic, amlodipine, hydralazine   Crohn's disease Chronic gastritis Continue home Pepcid, Creon, Protonix, budesonide, sucralfate   History of right peroneal vein DVT Previously on Eliquis. Planned end date of July 14th.   History of breast cancer In remission. Patient follows with oncology  Discharge Diagnoses:  Principal Problem:   Acute renal failure superimposed on stage 4 chronic kidney disease ([place]) Active Problems:   CAD (coronary artery disease)   Type 2 diabetes mellitus with peripheral neuropathy ([place])   Essential hypertension   COVID-19    Discharge Instructions   Allergies as of 11/04/2020       Reactions   Percocet [oxycodone-acetaminophen] Other (  See Comments)   headaches   Diazepam Other (See Comments)   REACTION: Agitation Other reaction(s): agitation   Haloperidol Lactate Nausea And Vomiting    Lorazepam Nausea Only   Other reaction(s): nausea   Morphine Sulfate Other (See Comments)   REACTION: Agitation   Propoxyphene Hcl Nausea And Vomiting   Tramadol Hcl Nausea And Vomiting   Anesthetics, Halogenated    Per patient's daughter, she can not tolerate anesthetics.    Etomidate    Heart stopped during anesthesia   Fentanyl    Heart stopped during anesthesia   Haloperidol    Other reaction(s): nausea & vomitin   Morphine Sulfate    Other reaction(s): nausea   Oxycodone-acetaminophen    Other reaction(s): headaches   Propoxyphene    Other reaction(s): nausea & vomiting   Tramadol Hcl    Other reaction(s): nausea & vomitin Other reaction(s): nausea & vomitin   Versed [midazolam]    Heart stopped during anesthesia        Medication List     TAKE these medications    acetaminophen 500 MG tablet Commonly known as: TYLENOL Take 1,000 mg by mouth every 6 (six) hours as needed for moderate pain or headache.   amLODipine 10 MG tablet Commonly known as: NORVASC Take 1 tablet (10 mg total) by mouth daily.   apixaban 5 MG Tabs tablet Commonly known as: ELIQUIS Take 1 tablet (5 mg total) by mouth 2 (two) times daily. Start after you finish the starter pack What changed: additional instructions   B-12 COMPLIANCE INJECTION IJ Inject 1 vial as directed every 30 (thirty) days.   budesonide 3 MG 24 hr capsule Commonly known as: ENTOCORT EC Take 1 capsule by mouth once daily   ergocalciferol 1.25 MG (50000 UT) capsule Commonly known as: VITAMIN D2 Take 1 capsule (50,000 Units total) by mouth once a week.   famotidine 20 MG tablet Commonly known as: PEPCID Take 1 tablet (20 mg total) by mouth at bedtime. What changed: how much to take   feeding supplement Liqd Take 237 mLs by mouth 2 (two) times daily between meals.   FLUoxetine 20 MG capsule Commonly known as: PROZAC Take 20 mg by mouth at bedtime.   furosemide 40 MG tablet Commonly known as: LASIX Take 1  tablet (40 mg total) by mouth daily. Start taking on: November 05, 2020   hydrALAZINE 100 MG tablet Commonly known as: APRESOLINE TAKE 1 TABLET BY MOUTH 3  TIMES DAILY What changed:  how much to take when to take this   isosorbide mononitrate 30 MG 24 hr tablet Commonly known as: IMDUR TAKE 1/2 (ONE-HALF) TABLET BY MOUTH ONCE DAILY (PLEASE  SCHEDULE  AN  APPOINTMENT  FOR  FURTHER  REFILLS) What changed: See the new instructions.   linagliptin 5 MG Tabs tablet Commonly known as: TRADJENTA Take 5 mg by mouth daily.   lipase/protease/amylase 36000 UNITS Cpep capsule Commonly known as: CREON Take 36,000 Units by mouth 3 (three) times daily with meals.   loperamide 2 MG tablet Commonly known as: IMODIUM A-D Take 4 mg by mouth 4 (four) times daily as needed for diarrhea or loose stools.   melatonin 5 MG Tabs Take 10 mg by mouth at bedtime as needed (sleep).   nebivolol 10 MG tablet Commonly known as: BYSTOLIC Take 1 tablet (10 mg total) by mouth daily.   nitroGLYCERIN 0.4 MG SL tablet Commonly known as: NITROSTAT DISSOLVE 1 TABLET UNDER THE TONGUE EVERY 5 MINUTES AS  NEEDED  FOR CHEST PAIN. MAX  OF 3 TABLETS IN 15 MINUTES. CALL 911 IF PAIN PERSISTS. What changed: See the new instructions.   ondansetron 4 MG disintegrating tablet Commonly known as: Zofran ODT Take 1 tablet (4 mg total) by mouth every 8 (eight) hours as needed for nausea or vomiting.   pantoprazole 40 MG tablet Commonly known as: PROTONIX TAKE 1 TABLET BY MOUTH  DAILY BEFORE BREAKFAST What changed: when to take this   potassium chloride SA 20 MEQ tablet Commonly known as: KLOR-CON Take 20 mEq by mouth daily.   RETACRIT IJ Inject 1 vial as directed every 30 (thirty) days.   sucralfate 1 g tablet Commonly known as: CARAFATE Take 1 g by mouth 4 (four) times daily -  with meals and at bedtime.        Follow-up Information     Seward Carol, MD. Schedule an appointment as soon as possible for a visit  in 1 week(s).   Specialty: Internal Medicine Why: For hospital follow-up Contact information: [address] [place] [address] [phone]                Allergies  Allergen Reactions   Percocet [Oxycodone-Acetaminophen] Other (See Comments)    headaches   Diazepam Other (See Comments)    REACTION: Agitation Other reaction(s): agitation   Haloperidol Lactate Nausea And Vomiting   Lorazepam Nausea Only    Other reaction(s): nausea   Morphine Sulfate Other (See Comments)    REACTION: Agitation   Propoxyphene Hcl Nausea And Vomiting   Tramadol Hcl Nausea And Vomiting   Anesthetics, Halogenated     Per patient's daughter, she can not tolerate anesthetics.    Etomidate     Heart stopped during anesthesia   Fentanyl     Heart stopped during anesthesia    Haloperidol     Other reaction(s): nausea & vomitin   Morphine Sulfate     Other reaction(s): nausea   Oxycodone-Acetaminophen     Other reaction(s): headaches   Propoxyphene     Other reaction(s): nausea & vomiting   Tramadol Hcl     Other reaction(s): nausea & vomitin Other reaction(s): nausea & vomitin   Versed [Midazolam]     Heart stopped during anesthesia    Consultations: Nephrology   Procedures/Studies: DG Chest Portable 1 View  Result Date: 11/01/2020 CLINICAL DATA:  Weakness EXAM: PORTABLE CHEST 1 VIEW COMPARISON:  07/20/2020 FINDINGS: Lungs are clear.  No pleural effusion or pneumothorax. The heart is normal in size. Surgical clips along the right lateral chest wall/axilla. Surgical clips in the right upper abdomen. IMPRESSION: No evidence of acute cardiopulmonary disease. Electronically Signed   By: Julian Hy M.D.   On: 11/01/2020 21:02      Subjective: No issues. States she is eating but just did not get breakfast.  Discharge Exam: Vitals:   11/04/20 0805 11/04/20 1005  BP:  (!) 130/53  Pulse: 85 88  Resp: 16 16  Temp: 98.8 F (37.1 C)   SpO2: 99% 98%    Vitals:   11/04/20 0454 11/04/20 0615 11/04/20 0805 11/04/20 1005  BP: 138/63   (!) 130/53  Pulse: 79  85 88  Resp: 16  16 16   Temp: 98.7 F (37.1 C)  98.8 F (37.1 C)   TempSrc: Oral  Oral   SpO2:   99% 98%  Weight:  65.2 kg    Height:        General: Pt is alert, awake, not  in acute distress Cardiovascular: RRR, S1/S2 +, no rubs, no gallops Respiratory: CTA bilaterally, no wheezing, no rhonchi Abdominal: Soft, NT, ND, bowel sounds + Extremities: no cyanosis    The results of significant diagnostics from this hospitalization (including imaging, microbiology, ancillary and laboratory) are listed below for reference.     Microbiology: Recent Results (from the past 240 hour(s))  Resp Panel by RT-PCR (Flu A&B, Covid) Nasopharyngeal Swab     Status: Abnormal   Collection Time: 11/01/20  9:24 PM   Specimen: Nasopharyngeal Swab; Nasopharyngeal(NP) swabs in vial transport medium  Result Value Ref Range Status   SARS Coronavirus 2 by RT PCR POSITIVE (A) NEGATIVE Final    Comment: RESULT CALLED TO, READ BACK BY AND VERIFIED WITH: RN L. VENEGAS 11/01/20 AT 2239 [place] (NOTE) SARS-CoV-2 target nucleic acids are DETECTED.  The SARS-CoV-2 RNA is generally detectable in upper respiratory specimens during the acute phase of infection. Positive results are indicative of the presence of the identified virus, but do not rule out bacterial infection or co-infection with other pathogens not detected by the test. Clinical correlation with patient history and other diagnostic information is necessary to determine patient infection status. The expected result is Negative.  Fact Sheet for Patients: EntrepreneurPulse.com.au  Fact Sheet for Healthcare Providers: IncredibleEmployment.be  This test is not yet approved or cleared by the [place] FDA and  has been authorized for detection and/or diagnosis of SARS-CoV-2 by FDA under an Emergency Use  Authorization (EUA).  This EUA will remain in effect (meaning this test can be  used) for the duration of  the COVID-19 declaration under Section 564(b)(1) of the Act, 21 U.S.C. section 360bbb-3(b)(1), unless the authorization is terminated or revoked sooner.     Influenza A by PCR NEGATIVE NEGATIVE Final   Influenza B by PCR NEGATIVE NEGATIVE Final    Comment: (NOTE) The Xpert Xpress SARS-CoV-2/FLU/RSV plus assay is intended as an aid in the diagnosis of influenza from Nasopharyngeal swab specimens and should not be used as a sole basis for treatment. Nasal washings and aspirates are unacceptable for Xpert Xpress SARS-CoV-2/FLU/RSV testing.  Fact Sheet for Patients: EntrepreneurPulse.com.au  Fact Sheet for Healthcare Providers: IncredibleEmployment.be  This test is not yet approved or cleared by the [place] FDA and has been authorized for detection and/or diagnosis of SARS-CoV-2 by FDA under an Emergency Use Authorization (EUA). This EUA will remain in effect (meaning this test can be used) for the duration of the COVID-19 declaration under Section 564(b)(1) of the Act, 21 U.S.C. section 360bbb-3(b)(1), unless the authorization is terminated or revoked.  Performed at [facility], [address], [place], [place] [address]      Labs: BNP (last 3 results) Recent Labs    06/28/20 2056 07/20/20 1530  BNP 59.9 625.6*   Basic Metabolic Panel: Recent Labs  Lab 11/01/20 2124 11/02/20 1120 11/03/20 0324 11/04/20 0140  NA 139 139 138 138  K 4.5 4.2 4.0 3.4*  CL 106 107 110 109  CO2 19* 20* 21* 20*  GLUCOSE 108* 100* 87 69*  BUN 56* 49* 45* 39*  CREATININE 4.47* 4.03* 3.53* 3.20*  CALCIUM 10.0 9.6 8.9 9.0  PHOS  --   --  3.4 3.4   Liver Function Tests: Recent Labs  Lab 11/01/20 2124 11/03/20 0324 11/04/20 0140  AST 22  --   --   ALT 32  --   --   ALKPHOS 79  --   --   BILITOT 0.4  --   --  PROT  7.0  --   --   ALBUMIN 3.7 2.6* 2.7*   No results for input(s): LIPASE, AMYLASE in the last 168 hours. No results for input(s): AMMONIA in the last 168 hours. CBC: Recent Labs  Lab 11/01/20 2124 11/03/20 0324  WBC 9.3 6.1  NEUTROABS 7.1  --   HGB 10.9* 9.4*  HCT 33.9* 29.1*  MCV 82.9 83.6  PLT 208 167   Cardiac Enzymes: No results for input(s): CKTOTAL, CKMB, CKMBINDEX, TROPONINI in the last 168 hours. BNP: Invalid input(s): POCBNP CBG: No results for input(s): GLUCAP in the last 168 hours. D-Dimer No results for input(s): DDIMER in the last 72 hours. Hgb A1c No results for input(s): HGBA1C in the last 72 hours. Lipid Profile No results for input(s): CHOL, HDL, LDLCALC, TRIG, CHOLHDL, LDLDIRECT in the last 72 hours. Thyroid function studies No results for input(s): TSH, T4TOTAL, T3FREE, THYROIDAB in the last 72 hours.  Invalid input(s): FREET3 Anemia work up Recent Labs    11/02/20 1120  FERRITIN 211  TIBC 241*  IRON 65   Urinalysis    Component Value Date/Time   COLORURINE COLORLESS (A) 11/02/2020 0027   APPEARANCEUR CLEAR 11/02/2020 0027   APPEARANCEUR Clear 08/22/2018 1620   LABSPEC 1.009 11/02/2020 0027   LABSPEC 1.020 03/03/2014 1341   PHURINE 5.0 11/02/2020 0027   GLUCOSEU NEGATIVE 11/02/2020 0027   GLUCOSEU Negative 03/03/2014 1341   HGBUR NEGATIVE 11/02/2020 0027   BILIRUBINUR NEGATIVE 11/02/2020 0027   BILIRUBINUR Negative 08/22/2018 1620   BILIRUBINUR Negative 03/03/2014 1341   KETONESUR NEGATIVE 11/02/2020 0027   PROTEINUR TRACE (A) 11/02/2020 0027   UROBILINOGEN 0.2 03/03/2014 1341   NITRITE NEGATIVE 11/02/2020 0027   LEUKOCYTESUR NEGATIVE 11/02/2020 0027   LEUKOCYTESUR Negative 03/03/2014 1341   Sepsis Labs Invalid input(s): PROCALCITONIN,  WBC,  LACTICIDVEN Microbiology Recent Results (from the past 240 hour(s))  Resp Panel by RT-PCR (Flu A&B, Covid) Nasopharyngeal Swab     Status: Abnormal   Collection Time: 11/01/20  9:24 PM    Specimen: Nasopharyngeal Swab; Nasopharyngeal(NP) swabs in vial transport medium  Result Value Ref Range Status   SARS Coronavirus 2 by RT PCR POSITIVE (A) NEGATIVE Final    Comment: RESULT CALLED TO, READ BACK BY AND VERIFIED WITH: RN L. VENEGAS 11/01/20 AT 2239 [place] (NOTE) SARS-CoV-2 target nucleic acids are DETECTED.  The SARS-CoV-2 RNA is generally detectable in upper respiratory specimens during the acute phase of infection. Positive results are indicative of the presence of the identified virus, but do not rule out bacterial infection or co-infection with other pathogens not detected by the test. Clinical correlation with patient history and other diagnostic information is necessary to determine patient infection status. The expected result is Negative.  Fact Sheet for Patients: EntrepreneurPulse.com.au  Fact Sheet for Healthcare Providers: IncredibleEmployment.be  This test is not yet approved or cleared by the [place] FDA and  has been authorized for detection and/or diagnosis of SARS-CoV-2 by FDA under an Emergency Use Authorization (EUA).  This EUA will remain in effect (meaning this test can be  used) for the duration of  the COVID-19 declaration under Section 564(b)(1) of the Act, 21 U.S.C. section 360bbb-3(b)(1), unless the authorization is terminated or revoked sooner.     Influenza A by PCR NEGATIVE NEGATIVE Final   Influenza B by PCR NEGATIVE NEGATIVE Final    Comment: (NOTE) The Xpert Xpress SARS-CoV-2/FLU/RSV plus assay is intended as an aid in the diagnosis of influenza from Nasopharyngeal swab specimens and  should not be used as a sole basis for treatment. Nasal washings and aspirates are unacceptable for Xpert Xpress SARS-CoV-2/FLU/RSV testing.  Fact Sheet for Patients: EntrepreneurPulse.com.au  Fact Sheet for Healthcare Providers: IncredibleEmployment.be  This test is not yet  approved or cleared by the [place] FDA and has been authorized for detection and/or diagnosis of SARS-CoV-2 by FDA under an Emergency Use Authorization (EUA). This EUA will remain in effect (meaning this test can be used) for the duration of the COVID-19 declaration under Section 564(b)(1) of the Act, 21 U.S.C. section 360bbb-3(b)(1), unless the authorization is terminated or revoked.  Performed at [facility], [address], [place], [place] [address]      Time coordinating discharge: 35 minutes  SIGNED:   Cordelia Poche, MD [facility] 11/04/2020, 11:05 AM

## 2020-11-04 NOTE — Discharge Instructions (Signed)
[name],  You were in the hospital with kidney injury likely from dehydration. You will need to keep hydrated and well fed. Please resume your Lasix on Saturday.

## 2020-11-04 NOTE — Consult Note (Signed)
   [facility] CM Inpatient Consult   11/04/2020  [name] 11-06-1945 [id number]  [place] Organization [ACO] Patient: [name]  Primary Care Provider:  Seward Carol, MD, an [facility] Physician that is listed to provide the [facility] calls and follow up.   Patient is currently active with [facility] for chronic disease management services.  Patient has been engaged by a [facility].  Plan: Follow up with [place] Coordinator and update on needs for post hospital care.  Of note, [facility] Care Management services does not replace or interfere with any services that are needed or arranged by inpatient [facility] care management team.  For additional questions or referrals please contact:   Natividad Brood, RN BSN [facility] Liaison  [phone] business mobile phone Toll free office [phone]  Fax number: [phone] [name]

## 2020-11-11 ENCOUNTER — Other Ambulatory Visit: Payer: Self-pay | Admitting: *Deleted

## 2020-11-11 NOTE — Patient Outreach (Signed)
[facility]) Care Management  11/11/2020  [name] 10-Mar-1946 [id number]   Noted member was admitted to hospital 8/16-8/19 for Covid infection.  Call placed to member, no answer, HIPAA compliant voice message left.  Will follow up within the next 3-4 business days.  [name], [place], MSN [place] [phone]

## 2020-11-15 ENCOUNTER — Inpatient Hospital Stay (HOSPITAL_COMMUNITY)
Admission: EM | Admit: 2020-11-15 | Discharge: 2020-11-19 | DRG: 204 | Disposition: A | Discharge status: Home Health | Payer: Medicare Other | Attending: Internal Medicine | Admitting: Internal Medicine

## 2020-11-15 ENCOUNTER — Other Ambulatory Visit: Payer: Self-pay

## 2020-11-15 ENCOUNTER — Encounter (HOSPITAL_COMMUNITY): Payer: Self-pay | Admitting: Internal Medicine

## 2020-11-15 ENCOUNTER — Emergency Department (HOSPITAL_COMMUNITY): Payer: Medicare Other

## 2020-11-15 DIAGNOSIS — Z801 Family history of malignant neoplasm of trachea, bronchus and lung: Secondary | ICD-10-CM

## 2020-11-15 DIAGNOSIS — F411 Generalized anxiety disorder: Secondary | ICD-10-CM | POA: Diagnosis present

## 2020-11-15 DIAGNOSIS — Z7984 Long term (current) use of oral hypoglycemic drugs: Secondary | ICD-10-CM

## 2020-11-15 DIAGNOSIS — Z9049 Acquired absence of other specified parts of digestive tract: Secondary | ICD-10-CM

## 2020-11-15 DIAGNOSIS — Z800 Family history of malignant neoplasm of digestive organs: Secondary | ICD-10-CM

## 2020-11-15 DIAGNOSIS — R064 Hyperventilation: Secondary | ICD-10-CM | POA: Diagnosis not present

## 2020-11-15 DIAGNOSIS — Z885 Allergy status to narcotic agent status: Secondary | ICD-10-CM

## 2020-11-15 DIAGNOSIS — I4510 Unspecified right bundle-branch block: Secondary | ICD-10-CM | POA: Diagnosis not present

## 2020-11-15 DIAGNOSIS — N184 Chronic kidney disease, stage 4 (severe): Secondary | ICD-10-CM

## 2020-11-15 DIAGNOSIS — R0602 Shortness of breath: Secondary | ICD-10-CM | POA: Diagnosis not present

## 2020-11-15 DIAGNOSIS — Z833 Family history of diabetes mellitus: Secondary | ICD-10-CM

## 2020-11-15 DIAGNOSIS — Z8616 Personal history of COVID-19: Secondary | ICD-10-CM | POA: Diagnosis not present

## 2020-11-15 DIAGNOSIS — Z923 Personal history of irradiation: Secondary | ICD-10-CM

## 2020-11-15 DIAGNOSIS — E559 Vitamin D deficiency, unspecified: Secondary | ICD-10-CM | POA: Diagnosis present

## 2020-11-15 DIAGNOSIS — F32A Depression, unspecified: Secondary | ICD-10-CM | POA: Diagnosis not present

## 2020-11-15 DIAGNOSIS — I951 Orthostatic hypotension: Secondary | ICD-10-CM | POA: Diagnosis present

## 2020-11-15 DIAGNOSIS — Z20822 Contact with and (suspected) exposure to covid-19: Secondary | ICD-10-CM | POA: Diagnosis present

## 2020-11-15 DIAGNOSIS — E538 Deficiency of other specified B group vitamins: Secondary | ICD-10-CM | POA: Diagnosis not present

## 2020-11-15 DIAGNOSIS — E876 Hypokalemia: Secondary | ICD-10-CM | POA: Diagnosis not present

## 2020-11-15 DIAGNOSIS — I130 Hypertensive heart and chronic kidney disease with heart failure and stage 1 through stage 4 chronic kidney disease, or unspecified chronic kidney disease: Secondary | ICD-10-CM | POA: Diagnosis present

## 2020-11-15 DIAGNOSIS — T502X5A Adverse effect of carbonic-anhydrase inhibitors, benzothiadiazides and other diuretics, initial encounter: Secondary | ICD-10-CM | POA: Diagnosis not present

## 2020-11-15 DIAGNOSIS — D631 Anemia in chronic kidney disease: Secondary | ICD-10-CM | POA: Diagnosis present

## 2020-11-15 DIAGNOSIS — R609 Edema, unspecified: Secondary | ICD-10-CM | POA: Diagnosis not present

## 2020-11-15 DIAGNOSIS — Z888 Allergy status to other drugs, medicaments and biological substances status: Secondary | ICD-10-CM

## 2020-11-15 DIAGNOSIS — E1169 Type 2 diabetes mellitus with other specified complication: Secondary | ICD-10-CM | POA: Diagnosis not present

## 2020-11-15 DIAGNOSIS — E1140 Type 2 diabetes mellitus with diabetic neuropathy, unspecified: Secondary | ICD-10-CM | POA: Diagnosis present

## 2020-11-15 DIAGNOSIS — R778 Other specified abnormalities of plasma proteins: Secondary | ICD-10-CM | POA: Diagnosis not present

## 2020-11-15 DIAGNOSIS — D509 Iron deficiency anemia, unspecified: Secondary | ICD-10-CM | POA: Diagnosis not present

## 2020-11-15 DIAGNOSIS — Z832 Family history of diseases of the blood and blood-forming organs and certain disorders involving the immune mechanism: Secondary | ICD-10-CM

## 2020-11-15 DIAGNOSIS — E785 Hyperlipidemia, unspecified: Secondary | ICD-10-CM | POA: Diagnosis not present

## 2020-11-15 DIAGNOSIS — E119 Type 2 diabetes mellitus without complications: Secondary | ICD-10-CM | POA: Diagnosis present

## 2020-11-15 DIAGNOSIS — R0689 Other abnormalities of breathing: Secondary | ICD-10-CM | POA: Diagnosis not present

## 2020-11-15 DIAGNOSIS — Z79899 Other long term (current) drug therapy: Secondary | ICD-10-CM

## 2020-11-15 DIAGNOSIS — Z8249 Family history of ischemic heart disease and other diseases of the circulatory system: Secondary | ICD-10-CM

## 2020-11-15 DIAGNOSIS — Z853 Personal history of malignant neoplasm of breast: Secondary | ICD-10-CM | POA: Diagnosis not present

## 2020-11-15 DIAGNOSIS — I2510 Atherosclerotic heart disease of native coronary artery without angina pectoris: Secondary | ICD-10-CM | POA: Diagnosis present

## 2020-11-15 DIAGNOSIS — R9431 Abnormal electrocardiogram [ECG] [EKG]: Secondary | ICD-10-CM | POA: Diagnosis not present

## 2020-11-15 DIAGNOSIS — N179 Acute kidney failure, unspecified: Secondary | ICD-10-CM | POA: Diagnosis present

## 2020-11-15 DIAGNOSIS — Z86718 Personal history of other venous thrombosis and embolism: Secondary | ICD-10-CM

## 2020-11-15 DIAGNOSIS — E46 Unspecified protein-calorie malnutrition: Secondary | ICD-10-CM | POA: Diagnosis present

## 2020-11-15 DIAGNOSIS — R627 Adult failure to thrive: Secondary | ICD-10-CM | POA: Diagnosis not present

## 2020-11-15 DIAGNOSIS — E1122 Type 2 diabetes mellitus with diabetic chronic kidney disease: Secondary | ICD-10-CM | POA: Diagnosis present

## 2020-11-15 DIAGNOSIS — Z7901 Long term (current) use of anticoagulants: Secondary | ICD-10-CM

## 2020-11-15 DIAGNOSIS — R634 Abnormal weight loss: Secondary | ICD-10-CM | POA: Diagnosis not present

## 2020-11-15 DIAGNOSIS — I5032 Chronic diastolic (congestive) heart failure: Secondary | ICD-10-CM | POA: Diagnosis not present

## 2020-11-15 DIAGNOSIS — R202 Paresthesia of skin: Secondary | ICD-10-CM | POA: Diagnosis not present

## 2020-11-15 DIAGNOSIS — Z8673 Personal history of transient ischemic attack (TIA), and cerebral infarction without residual deficits: Secondary | ICD-10-CM

## 2020-11-15 DIAGNOSIS — R531 Weakness: Secondary | ICD-10-CM | POA: Diagnosis not present

## 2020-11-15 DIAGNOSIS — K219 Gastro-esophageal reflux disease without esophagitis: Secondary | ICD-10-CM | POA: Diagnosis present

## 2020-11-15 DIAGNOSIS — R069 Unspecified abnormalities of breathing: Secondary | ICD-10-CM | POA: Diagnosis not present

## 2020-11-15 DIAGNOSIS — R079 Chest pain, unspecified: Secondary | ICD-10-CM | POA: Diagnosis not present

## 2020-11-15 DIAGNOSIS — Z803 Family history of malignant neoplasm of breast: Secondary | ICD-10-CM

## 2020-11-15 DIAGNOSIS — F419 Anxiety disorder, unspecified: Secondary | ICD-10-CM | POA: Diagnosis present

## 2020-11-15 LAB — CBC WITH DIFFERENTIAL/PLATELET
Abs Immature Granulocytes: 0.05 10*3/uL (ref 0.00–0.07)
Basophils Absolute: 0 10*3/uL (ref 0.0–0.1)
Basophils Relative: 0 %
Eosinophils Absolute: 0 10*3/uL (ref 0.0–0.5)
Eosinophils Relative: 0 %
HCT: 33.2 % — ABNORMAL LOW (ref 36.0–46.0)
Hemoglobin: 10.8 g/dL — ABNORMAL LOW (ref 12.0–15.0)
Immature Granulocytes: 1 %
Lymphocytes Relative: 9 %
Lymphs Abs: 0.6 10*3/uL — ABNORMAL LOW (ref 0.7–4.0)
MCH: 26.7 pg (ref 26.0–34.0)
MCHC: 32.5 g/dL (ref 30.0–36.0)
MCV: 82.2 fL (ref 80.0–100.0)
Monocytes Absolute: 0.6 10*3/uL (ref 0.1–1.0)
Monocytes Relative: 8 %
Neutro Abs: 5.9 10*3/uL (ref 1.7–7.7)
Neutrophils Relative %: 82 %
Platelets: 206 10*3/uL (ref 150–400)
RBC: 4.04 MIL/uL (ref 3.87–5.11)
RDW: 16.6 % — ABNORMAL HIGH (ref 11.5–15.5)
WBC: 7.2 10*3/uL (ref 4.0–10.5)
nRBC: 0 % (ref 0.0–0.2)

## 2020-11-15 LAB — COMPREHENSIVE METABOLIC PANEL
ALT: 44 U/L (ref 0–44)
AST: 55 U/L — ABNORMAL HIGH (ref 15–41)
Albumin: 3.2 g/dL — ABNORMAL LOW (ref 3.5–5.0)
Alkaline Phosphatase: 76 U/L (ref 38–126)
Anion gap: 13 (ref 5–15)
BUN: 30 mg/dL — ABNORMAL HIGH (ref 8–23)
CO2: 18 mmol/L — ABNORMAL LOW (ref 22–32)
Calcium: 9.1 mg/dL (ref 8.9–10.3)
Chloride: 108 mmol/L (ref 98–111)
Creatinine, Ser: 2.58 mg/dL — ABNORMAL HIGH (ref 0.44–1.00)
GFR, Estimated: 19 mL/min — ABNORMAL LOW (ref >60)
Glucose, Bld: 124 mg/dL — ABNORMAL HIGH (ref 70–99)
Potassium: 3.4 mmol/L — ABNORMAL LOW (ref 3.5–5.1)
Sodium: 139 mmol/L (ref 135–145)
Total Bilirubin: 0.7 mg/dL (ref 0.3–1.2)
Total Protein: 6.9 g/dL (ref 6.5–8.1)

## 2020-11-15 LAB — RESP PANEL BY RT-PCR (FLU A&B, COVID) ARPGX2
Influenza A by PCR: NEGATIVE
Influenza B by PCR: NEGATIVE
SARS Coronavirus 2 by RT PCR: NEGATIVE

## 2020-11-15 LAB — LACTIC ACID, PLASMA
Lactic Acid, Venous: 2 mmol/L (ref 0.5–1.9)
Lactic Acid, Venous: 1.4 mmol/L (ref 0.5–1.9)

## 2020-11-15 LAB — TROPONIN I (HIGH SENSITIVITY)
Troponin I (High Sensitivity): 47 ng/L — ABNORMAL HIGH (ref <18)
Troponin I (High Sensitivity): 55 ng/L — ABNORMAL HIGH (ref <18)

## 2020-11-15 LAB — TSH: TSH: 1.694 u[IU]/mL (ref 0.350–4.500)

## 2020-11-15 MED ORDER — ONDANSETRON HCL 4 MG PO TABS: 4.0000 mg | ORAL_TABLET | Freq: Four times a day (QID) | ORAL | Status: DC | PRN

## 2020-11-15 MED ORDER — ISOSORBIDE MONONITRATE ER 30 MG PO TB24
15.0000 mg | ORAL_TABLET | Freq: Every day | ORAL | Status: DC
  Administered 2020-11-16: 15 mg via ORAL
  Filled 2020-11-15 (×2): qty 1

## 2020-11-15 MED ORDER — ENSURE ENLIVE PO LIQD: 237.0000 mL | Freq: Two times a day (BID) | ORAL | Status: DC

## 2020-11-15 MED ORDER — FLUOXETINE HCL 20 MG PO CAPS
20.0000 mg | ORAL_CAPSULE | Freq: Every day | ORAL | Status: DC
  Administered 2020-11-15 – 2020-11-18 (×4): 20 mg via ORAL
  Filled 2020-11-15 (×4): qty 1

## 2020-11-15 MED ORDER — SODIUM CHLORIDE 0.9 % IV BOLUS
500.0000 mL | Freq: Once | INTRAVENOUS | Status: AC
  Administered 2020-11-15: 500 mL via INTRAVENOUS

## 2020-11-15 MED ORDER — POTASSIUM CHLORIDE CRYS ER 20 MEQ PO TBCR
20.0000 meq | EXTENDED_RELEASE_TABLET | Freq: Once | ORAL | Status: AC
  Administered 2020-11-16: 20 meq via ORAL
  Filled 2020-11-15: qty 1

## 2020-11-15 MED ORDER — PANCRELIPASE (LIP-PROT-AMYL) 12000-38000 UNITS PO CPEP
36000.0000 [IU] | ORAL_CAPSULE | Freq: Three times a day (TID) | ORAL | Status: DC
  Administered 2020-11-16 – 2020-11-19 (×9): 36000 [IU] via ORAL
  Filled 2020-11-15 (×10): qty 3

## 2020-11-15 MED ORDER — NEBIVOLOL HCL 10 MG PO TABS
10.0000 mg | ORAL_TABLET | Freq: Every day | ORAL | Status: DC
  Administered 2020-11-16 – 2020-11-17 (×2): 10 mg via ORAL
  Filled 2020-11-15 (×3): qty 1

## 2020-11-15 MED ORDER — ENOXAPARIN SODIUM 30 MG/0.3ML IJ SOSY
30.0000 mg | PREFILLED_SYRINGE | Freq: Every day | INTRAMUSCULAR | Status: DC
  Filled 2020-11-15: qty 0.3

## 2020-11-15 MED ORDER — BUDESONIDE 3 MG PO CPEP
3.0000 mg | ORAL_CAPSULE | Freq: Every day | ORAL | Status: DC
  Administered 2020-11-16 – 2020-11-19 (×4): 3 mg via ORAL
  Filled 2020-11-15 (×4): qty 1

## 2020-11-15 MED ORDER — ONDANSETRON HCL 4 MG/2ML IJ SOLN: 4.0000 mg | Freq: Four times a day (QID) | INTRAMUSCULAR | Status: DC | PRN

## 2020-11-15 MED ORDER — HYDRALAZINE HCL 50 MG PO TABS
100.0000 mg | ORAL_TABLET | Freq: Three times a day (TID) | ORAL | Status: DC
  Administered 2020-11-15 – 2020-11-19 (×11): 100 mg via ORAL
  Filled 2020-11-15 (×11): qty 2

## 2020-11-15 MED ORDER — POLYETHYLENE GLYCOL 3350 17 G PO PACK: 17.0000 g | PACK | Freq: Every day | ORAL | Status: DC | PRN

## 2020-11-15 MED ORDER — ACETAMINOPHEN 325 MG PO TABS
650.0000 mg | ORAL_TABLET | Freq: Four times a day (QID) | ORAL | Status: DC | PRN
  Administered 2020-11-18 (×2): 650 mg via ORAL
  Filled 2020-11-15 (×2): qty 2

## 2020-11-15 MED ORDER — FAMOTIDINE 20 MG PO TABS: 40.0000 mg | ORAL_TABLET | Freq: Every day | ORAL | Status: DC

## 2020-11-15 MED ORDER — SUCRALFATE 1 G PO TABS
1.0000 g | ORAL_TABLET | Freq: Three times a day (TID) | ORAL | Status: DC
  Administered 2020-11-16 – 2020-11-19 (×13): 1 g via ORAL
  Filled 2020-11-15 (×14): qty 1

## 2020-11-15 MED ORDER — ASPIRIN 81 MG PO CHEW
324.0000 mg | CHEWABLE_TABLET | Freq: Once | ORAL | Status: AC
  Administered 2020-11-15: 324 mg via ORAL
  Filled 2020-11-15: qty 4

## 2020-11-15 MED ORDER — MELATONIN 5 MG PO TABS
10.0000 mg | ORAL_TABLET | Freq: Every evening | ORAL | Status: DC | PRN
  Administered 2020-11-16 (×2): 10 mg via ORAL
  Filled 2020-11-15 (×2): qty 2

## 2020-11-15 MED ORDER — INSULIN ASPART 100 UNIT/ML IJ SOLN
0.0000 [IU] | Freq: Three times a day (TID) | INTRAMUSCULAR | Status: DC
  Administered 2020-11-16: 3 [IU] via SUBCUTANEOUS
  Administered 2020-11-17 (×2): 2 [IU] via SUBCUTANEOUS
  Administered 2020-11-18: 3 [IU] via SUBCUTANEOUS
  Administered 2020-11-18: 2 [IU] via SUBCUTANEOUS
  Administered 2020-11-19: 3 [IU] via SUBCUTANEOUS

## 2020-11-15 MED ORDER — PANTOPRAZOLE SODIUM 40 MG PO TBEC
40.0000 mg | DELAYED_RELEASE_TABLET | Freq: Every day | ORAL | Status: DC
  Administered 2020-11-16 – 2020-11-19 (×4): 40 mg via ORAL
  Filled 2020-11-15 (×4): qty 1

## 2020-11-15 MED ORDER — ACETAMINOPHEN 650 MG RE SUPP: 650.0000 mg | Freq: Four times a day (QID) | RECTAL | Status: DC | PRN

## 2020-11-15 MED ORDER — LACTATED RINGERS IV SOLN: INTRAVENOUS | Status: DC

## 2020-11-15 NOTE — ED Provider Notes (Signed)
[facility] EMERGENCY DEPARTMENT Provider Note   CSN: [id number] Arrival date & time: 11/15/20  1301     History Chief Complaint  Patient presents with   Shortness of Breath    [name] is a 75 y.o. female.   Shortness of Breath Associated symptoms: no chest pain, no fever, no headaches and no vomiting    Patient presents with shortness of breath x1 day.  Started gradually throughout the day and has been worsening.  Is not associated with movement or rest, it comes intermittently without chronicity.  No alleviating factors.  It is associated with weakness.  States that earlier today she started feeling presyncopal, she felt very lightheaded and had to sit down.  She did not fall or lose consciousness, no associated chest pain or shortness of breath.  Patient had COVID and was hospitalized about a month ago primarily for kidney dysfunction.  Since being discharged from the hospital she has been having decreased appetite and thirst, she is not taking care of her self and has been acting more confused according to the daughter.  She is living with her son because she has not been taking her medicine or remembering to eat or drink.  She has lost roughly 60 pounds in less than 6 months without trying.  They state she has been weak in general and more forgetful.   Past Medical History:  Diagnosis Date   Abdominal discomfort    Chronic N/V/D. Presumptive dx Crohn's dx per elevated p ANCA. Failed Entocort and Pentasa. Sep 2003 - ileocolectomy c anastomosis per Dr Deon Pilling 2/2 adhesions - path was hegative for Crohns. EGD, Sm bowel follow through (11/03), and an eteroclysis (10/03) were unrevealing. Cuases hypomag and hypocalcemia.   Adnexal mass 8/03   s/p lap BSO (R ovarian fibroma) & lysis of adhesions   Allergy    Seasonal   Anemia    Multifactorial. Baseline HgB 10-11 ish. B12 def - 150 in 3/10. Fe Def - ferritin 35 3/10. Both are being repleted.   Breast  cancer ([place]) 03/16/13   right, 5 o'clock   CAD (coronary artery disease) 1996   1996 - PTCA and angioplasty diagonal branch. 2000 - Rotoblator & angiopllasty of diagonal. 2006 - subendocardial AMI, DES to proximal LAD.Marland Kitchen Also had 90% stenosis in distal apical LAD. EF 55 with apical hypokinesis. Indefinite ASA and Plavix.   CHF (congestive heart failure) (HCC)    Chronic kidney disease    Chronic renal insuff baseline Cr 1.2 - 1.4 ish.   Chronic pain    CT 10/10 = Spinal stenosis L2 - S1.   Diabetes mellitus    Insulin dependent   Gout    Hx of radiation therapy 06/02/13- 07/16/13   right rbeast 4500 cGy 25 sessions, right breast boost 1600 cGy in 8 sessions   Hyperlipidemia    Managed with both a statin and Welchol. Welchol stopped 2014 2/2 cost and started on fenofibrate    Hypertension    2006 B renal arteries patent. 2003 MRA - no RAS. 2003 pheo W/U Dr Hassell Done reportedly negative.   Hypoxia 07/23/2017   Lupus ([place])    Lymphedema of breast    Personal history of radiation therapy 2015   RBBB    Renal vein thrombosis (HCC)    SBO (small bowel obstruction) ([place]) 09/17/2017   Secondary hyperparathyroidism ([place])    Stroke ([place])    Incidental finding MRI 2002 L lacunar infarct   Vitamin B12 deficiency  Vitamin D deficiency    Wears dentures    top    Patient Active Problem List   Diagnosis Date Noted   COVID-19 11/02/2020   Malnutrition of moderate degree 09/27/2020   Transaminitis 09/23/2020   Acute renal failure superimposed on stage 4 chronic kidney disease ([place]) 09/03/2020   Chronic diastolic CHF (congestive heart failure) ([place]) 09/03/2020   CHF (congestive heart failure) ([place]) 07/20/2020   Dyspnea 06/28/2020   Chronic diarrhea - responds to budesonide 09/10/2019   Iron deficiency anemia 08/19/2018   Spondylosis of lumbar spine 12/24/2017   Renal vein thrombosis ([place]) 09/17/2017   Hoarse voice quality 09/09/2017   Low back pain without sciatica 09/09/2017   AKI (acute  kidney injury) ([place]) 08/25/2017   Labile blood glucose    Sleep disturbance    Leukocytosis    Essential hypertension    Urinary frequency    Type 2 diabetes mellitus with peripheral neuropathy (HCC)    Benign essential HTN    Stage 2 chronic kidney disease    Dysphagia    Debility    Encephalopathy    Pressure injury of skin 08/04/2017   Pneumonia of left lung due to infectious organism    Diffuse pulmonary alveolar hemorrhage    SOB (shortness of breath) 10/11/2016   Hyperparathyroidism, secondary renal ([place]) 05/02/2016   Gout 04/11/2015   Diabetic neuropathy associated with type 2 diabetes mellitus ([place]) 01/06/2015   SBO (small bowel obstruction) ([place]) 09/15/2013   Breast cancer of lower-inner quadrant of right female breast ([place]) 03/18/2013   Chronic venous insufficiency 01/06/2013   Health care maintenance 05/08/2011   Diabetes mellitus type 2 in obese ([place]) 05/03/2010   Seasonal allergies 05/03/2010   Labile hypertension    CAD (coronary artery disease)    Abdominal discomfort    Anemia in chronic kidney disease    History of stroke without residual deficits    Hyperlipidemia associated with type 2 diabetes mellitus (HCC)    Chronic pain    Chronic kidney disease, stage III (moderate) (HCC)    Adnexal mass    Spinal stenosis, lumbar region, with neurogenic claudication 01/03/2009    Past Surgical History:  Procedure Laterality Date   ABDOMINAL HYSTERECTOMY     BILATERAL SALPINGOOPHORECTOMY  8/03   Lap BSO (R ovarian fibroma) and adhesion lysis   BIOPSY  09/08/2020   Procedure: BIOPSY;  Surgeon: Ronald Lobo, MD;  Location: [facility];  Service: Endoscopy;;   BOWEL RESECTION  2003   ileocolectomy with anastomosis 2/2 adhesions   BREAST BIOPSY Right 2015   BREAST BIOPSY Right 07/2014   BREAST LUMPECTOMY Right 04/22/2013   BREAST LUMPECTOMY WITH NEEDLE LOCALIZATION AND AXILLARY SENTINEL LYMPH NODE BX Right 04/22/2013   Procedure: BREAST LUMPECTOMY WITH NEEDLE  LOCALIZATION AND AXILLARY SENTINEL LYMPH NODE BX;  Surgeon: Stark Klein, MD;  Location: [place];  Service: General;  Laterality: Right;   CARDIAC CATHETERIZATION     2 stents   CHOLECYSTECTOMY     COLONOSCOPY     ESOPHAGOGASTRODUODENOSCOPY N/A 09/08/2020   Procedure: ESOPHAGOGASTRODUODENOSCOPY (EGD);  Surgeon: Ronald Lobo, MD;  Location: [facility];  Service: Endoscopy;  Laterality: N/A;   HEMICOLECTOMY     R sided hemicolectomy   HERNIA REPAIR     Ventral hernia repair   PTCA  4/06     OB History   No obstetric history on file.     Family History  Problem Relation Age of Onset   Pancreatic cancer Brother 30  Breast cancer Mother 46   Lung cancer Maternal Aunt    Breast cancer Maternal Aunt    Prostate cancer Maternal Uncle    Heart attack Maternal Grandmother    Breast cancer Maternal Grandmother    Colon cancer Maternal Grandmother    Clotting disorder Maternal Grandmother    Diabetes Maternal Grandmother    Kidney failure Maternal Aunt    Hypertension Daughter     Social History   Tobacco Use   Smoking status: Never   Smokeless tobacco: Never  Vaping Use   Vaping Use: Never used  Substance Use Topics   Alcohol use: No    Alcohol/week: 0.0 standard drinks   Drug use: No    Home Medications Prior to Admission medications   Medication Sig Start Date End Date Taking? Authorizing Provider  acetaminophen (TYLENOL) 500 MG tablet Take 1,000 mg by mouth every 6 (six) hours as needed for moderate pain or headache.    [provider]  amLODipine (NORVASC) 10 MG tablet Take 1 tablet (10 mg total) by mouth daily. Patient not taking: Reported on 11/02/2020 08/22/17   Angiulli, Lavon Paganini, PA-C  apixaban (ELIQUIS) 5 MG TABS tablet Take 1 tablet (5 mg total) by mouth 2 (two) times daily. Start after you finish the starter pack Patient taking differently: Take 5 mg by mouth 2 (two) times daily. 07/24/20   Geradine Girt, DO  budesonide (ENTOCORT  EC) 3 MG 24 hr capsule Take 1 capsule by mouth once daily Patient taking differently: Take 3 mg by mouth daily. 10/04/20   Gatha Mayer, MD  Cyanocobalamin (B-12 COMPLIANCE INJECTION IJ) Inject 1 vial as directed every 30 (thirty) days.    [provider]  Epoetin Alfa-epbx (RETACRIT IJ) Inject 1 vial as directed every 30 (thirty) days.    [provider]  ergocalciferol (VITAMIN D2) 1.25 MG (50000 UT) capsule Take 1 capsule (50,000 Units total) by mouth once a week. 08/02/20   Truitt Merle, MD  famotidine (PEPCID) 20 MG tablet Take 1 tablet (20 mg total) by mouth at bedtime. Patient taking differently: Take 40 mg by mouth at bedtime. 09/27/20   Debbe Odea, MD  feeding supplement (ENSURE ENLIVE / ENSURE PLUS) LIQD Take 237 mLs by mouth 2 (two) times daily between meals. 11/04/20   Mariel Aloe, MD  FLUoxetine (PROZAC) 20 MG capsule Take 20 mg by mouth at bedtime.    [provider]  furosemide (LASIX) 40 MG tablet Take 1 tablet (40 mg total) by mouth daily. 11/05/20   Mariel Aloe, MD  hydrALAZINE (APRESOLINE) 100 MG tablet TAKE 1 TABLET BY MOUTH 3  TIMES DAILY Patient taking differently: Take 50 mg by mouth 2 (two) times daily. 10/24/20   Josue Hector, MD  isosorbide mononitrate (IMDUR) 30 MG 24 hr tablet TAKE 1/2 (ONE-HALF) TABLET BY MOUTH ONCE DAILY (PLEASE  SCHEDULE  AN  APPOINTMENT  FOR  FURTHER  REFILLS) Patient taking differently: Take 15 mg by mouth daily. 07/12/20   Isaiah Serge, NP  linagliptin (TRADJENTA) 5 MG TABS tablet Take 5 mg by mouth daily.    [provider]  lipase/protease/amylase (CREON) 36000 UNITS CPEP capsule Take 36,000 Units by mouth 3 (three) times daily with meals.    [provider]  loperamide (IMODIUM A-D) 2 MG tablet Take 4 mg by mouth 4 (four) times daily as needed for diarrhea or loose stools.    [provider]  Melatonin 5 MG TABS Take 10  mg by mouth at bedtime as needed (sleep).    [provider]  nebivolol (BYSTOLIC) 10 MG tablet Take 1 tablet (10 mg total) by mouth daily. 06/30/20   Mercy Riding, MD  nitroGLYCERIN (NITROSTAT) 0.4 MG SL tablet DISSOLVE 1 TABLET UNDER THE TONGUE EVERY 5 MINUTES AS  NEEDED FOR CHEST PAIN. MAX  OF 3 TABLETS IN 15 MINUTES. CALL 911 IF PAIN PERSISTS. Patient taking differently: Place 0.4 mg under the tongue every 5 (five) minutes as needed for chest pain. 10/04/20   Jerline Pain, MD  ondansetron (ZOFRAN ODT) 4 MG disintegrating tablet Take 1 tablet (4 mg total) by mouth every 8 (eight) hours as needed for nausea or vomiting. 08/17/20   Walisiewicz, Kaitlyn E, PA-C  pantoprazole (PROTONIX) 40 MG tablet TAKE 1 TABLET BY MOUTH  DAILY BEFORE BREAKFAST Patient taking differently: Take 40 mg by mouth daily. 10/24/20   Gatha Mayer, MD  potassium chloride SA (KLOR-CON) 20 MEQ tablet Take 20 mEq by mouth daily. 09/17/20   [provider]  sucralfate (CARAFATE) 1 g tablet Take 1 g by mouth 4 (four) times daily -  with meals and at bedtime.    [provider]    Allergies    Percocet [oxycodone-acetaminophen]; Diazepam; Haloperidol lactate; Lorazepam; Morphine sulfate; Propoxyphene hcl; Tramadol hcl; Anesthetics, halogenated; Etomidate; Fentanyl; Haloperidol; Morphine sulfate; Oxycodone-acetaminophen; Propoxyphene; Tramadol hcl; and Versed [midazolam]  Review of Systems   Review of Systems  Constitutional:  Positive for appetite change and unexpected weight change. Negative for fever.  Respiratory:  Positive for shortness of breath.   Cardiovascular:  Negative for chest pain and leg swelling.  Gastrointestinal:  Negative for nausea and vomiting.  Genitourinary:  Negative for dysuria and hematuria.  Neurological:  Positive for weakness and light-headedness. Negative for dizziness, syncope and headaches.  Psychiatric/Behavioral:  Positive for confusion.    Physical Exam Updated Vital Signs BP (!) 170/79 (BP Location: Right Arm)    Pulse 82   Temp 99 F (37.2 C) (Oral)   Resp 18   SpO2 95%   Physical Exam Vitals and nursing note reviewed. Exam conducted with a chaperone present.  Constitutional:      Appearance: Normal appearance.     Comments: Patient appears frail, weak.  HENT:     Head: Normocephalic and atraumatic.     Mouth/Throat:     Mouth: Mucous membranes are dry.  Eyes:     General: No scleral icterus.       Right eye: No discharge.        Left eye: No discharge.     Extraocular Movements: Extraocular movements intact.     Pupils: Pupils are equal, round, and reactive to light.  Cardiovascular:     Rate and Rhythm: Normal rate and regular rhythm.     Pulses: Normal pulses.     Heart sounds: Normal heart sounds. No murmur heard.   No friction rub. No gallop.  Pulmonary:     Effort: Pulmonary effort is normal. No tachypnea, accessory muscle usage or respiratory distress.     Breath sounds: Normal breath sounds.  Chest:     Chest wall: No mass.  Abdominal:     General: Abdomen is flat. Bowel sounds are normal. There is no distension.     Palpations: Abdomen is soft.     Tenderness: There is no abdominal tenderness.  Musculoskeletal:     Right lower leg: No edema.     Left lower leg: No  edema.  Skin:    General: Skin is warm and dry.     Coloration: Skin is not jaundiced.  Neurological:     Mental Status: She is alert. Mental status is at baseline.     Coordination: Coordination normal.    ED Results / Procedures / Treatments   Labs (all labs ordered are listed, but only abnormal results are displayed) Labs Reviewed  CBC WITH DIFFERENTIAL/PLATELET  COMPREHENSIVE METABOLIC PANEL  LACTIC ACID, PLASMA  LACTIC ACID, PLASMA  TROPONIN I (HIGH SENSITIVITY)  TROPONIN I (HIGH SENSITIVITY)    EKG None  Radiology DG Chest 2 View  Result Date: 11/15/2020 CLINICAL DATA:  weakness EXAM: CHEST - 2 VIEW COMPARISON:  None. FINDINGS: Vascular stent overlying the heart. The cardiomediastinal  silhouette is within normal limits. No pleural effusion. No pneumothorax. No mass or consolidation. No acute osseous abnormality. Right axillary surgical clips. IMPRESSION: No acute abnormality in the chest. Electronically Signed   By: Albin Felling M.D.   On: 11/15/2020 14:38    Procedures Procedures   Medications Ordered in ED Medications - No data to display  ED Course  I have reviewed the triage vital signs and the nursing notes.  Pertinent labs & imaging results that were available during my care of the patient were reviewed by me and considered in my medical decision making (see chart for details).    MDM Rules/Calculators/A&P                           Patient is not tachycardic or febrile, doubt sepsis.  No hypoxia or tachycardia, low suspicion for PE.  Patient had a transient episode of chest pain while with the nursing staff.  It lasted for seconds and resolved independently.  Patient stopped her anticoagulation in June or July of this year.  She has history of DVT.  Last echocardiogram was done in April.  EF of 50 to 55%.  Although she has not hypoxic or tachycardic, she does have history of DVT and high troponin.  We will order a VQ scan given that in the past her D-dimers have historically been high.  Patient will need admission for possible PE secondary to recent COVID versus cardiac etiology.  I spoken with Dr. Derrell Lolling was happy to admit the patient to the hospitalist service.  Final Clinical Impression(s) / ED Diagnoses Final diagnoses:  None    Rx / DC Orders ED Discharge Orders     None        Sherrill Raring, PA-C 11/15/20 2050    Blanchie Dessert, MD 11/16/20 [id number]

## 2020-11-15 NOTE — ED Notes (Signed)
Notified provider. Pt daughter requested for a dimer to be completed d/t pt hx of DVT.

## 2020-11-15 NOTE — H&P (Signed)
History and Physical    [name] XTG:[id number] DOB: 13-Dec-1945 DOA: 11/15/2020  PCP: Seward Carol, MD  Patient coming from: Home via EMS   Chief Complaint:  Chief Complaint  Patient presents with   Shortness of Breath     HPI:    75 year old female with past medical history of coronary artery disease (cath with rotoablator to LAD in 2703), chronic systolic congestive heart failure (Echo 06/2020 EF 55-60% with G1DD), right breast cancer (Dx 02/2013 S/P lumpectomy and prolonged course of Arimidex), right renal vein thrombosis (07/2017 S/P 6 months Xarelto),  RLE DVT (06/2020 S/P 3 months Eliquis), chronic kidney disease stage IV, gout, hypertension, non-insulin-dependent diabetes mellitus type 2 (hgb A1C 5.5%), gastroesophageal reflux disease, generalized anxiety disorder who presents to [facility] emergency department via EMS due to sudden onset shortness of breath.  Of note, patient was recently hospitalized at [facility] from 8/11 until 8/16.  Patient was hospitalized for acute kidney injury superimposed on chronic kidney disease stage IV.  Patient was evaluated by nephrology and was hydrated gently throughout the hospitalization with improvement in creatinine and eventual discharge on 8/16 with a creatinine of 3.2.  Patient is a somewhat poor historian and therefore history has been obtained from a combination of the patient, the granddaughter at the bedside and the daughter via phone conversation.  Patient explains that she was walking down the hallway in her home earlier today when she developed sudden onset intense lightheadedness.  This lightheadedness was associated with sudden onset of shortness of breath.  Shortness breath is worse with exertion and improved with rest.  Patient denies associated chest pain, leg pain or unilateral leg swelling.  Patient proceeded to walk out to her front porch where she continued to experience ongoing shortness of breath.   She proceeded to contact her family who quickly came to the house.  Additionally, she contacted EMS who promptly came to evaluate the patient.  Of note, family reports that patient has been exhibiting extremely poor oral intake for several months, but particularly within the past week or 2 since her most recent hospitalization at [facility].  They state that she has continued to exhibit rapid weight loss, approximating that she has lost 6 pounds in the past 3 to 4 months.  Patient reports that she absolutely has no appetite and eats very little as a result.  Patient's shortness of breath did not resolve even upon sitting down on her front porch while being assessed by family and EMS.  Patient was extremely tearful and crying throughout the EMS assessment according to EMS notes.  EMS then decided to bring the patient into [facility] emergency room for evaluation.  Upon evaluation in the emergency department patient continued to complain of shortness of breath although patient exhibited no evidence of hypoxia.  Chest x-ray was found to be unremarkable and EKG revealed no evidence of ST segment change.  Repeat COVID-19 testing was found to be negative despite being positive as recently as 8/16.  Troponin was found to be slightly elevated at 47 however patient continued to deny chest pain.  Patient was also found to exhibit a mild lactic acidosis of 2.0.  Patient was administered 500 cc of isotonic fluids.  A VQ scan was ordered by the ER provider that has yet to be completed.  The hospitalist group has now been called to assess the patient for admission to the hospital for continued symptoms of shortness of breath as well  as elevated troponin.  Review of Systems:   Review of Systems  Constitutional:  Positive for malaise/fatigue and weight loss.  Respiratory:  Positive for shortness of breath.   Gastrointestinal:  Positive for diarrhea and nausea.  Neurological:  Positive for weakness.   All other systems reviewed and are negative.  Past Medical History:  Diagnosis Date   Abdominal discomfort    Chronic N/V/D. Presumptive dx Crohn's dx per elevated p ANCA. Failed Entocort and Pentasa. Sep 2003 - ileocolectomy c anastomosis per Dr Deon Pilling 2/2 adhesions - path was hegative for Crohns. EGD, Sm bowel follow through (11/03), and an eteroclysis (10/03) were unrevealing. Cuases hypomag and hypocalcemia.   Adnexal mass 8/03   s/p lap BSO (R ovarian fibroma) & lysis of adhesions   Allergy    Seasonal   Anemia    Multifactorial. Baseline HgB 10-11 ish. B12 def - 150 in 3/10. Fe Def - ferritin 35 3/10. Both are being repleted.   Breast cancer ([place]) 03/16/13   right, 5 o'clock   CAD (coronary artery disease) 1996   1996 - PTCA and angioplasty diagonal branch. 2000 - Rotoblator & angiopllasty of diagonal. 2006 - subendocardial AMI, DES to proximal LAD.Marland Kitchen Also had 90% stenosis in distal apical LAD. EF 55 with apical hypokinesis. Indefinite ASA and Plavix.   CHF (congestive heart failure) (HCC)    Chronic kidney disease    Chronic renal insuff baseline Cr 1.2 - 1.4 ish.   Chronic pain    CT 10/10 = Spinal stenosis L2 - S1.   Diabetes mellitus    Insulin dependent   Gout    Hx of radiation therapy 06/02/13- 07/16/13   right rbeast 4500 cGy 25 sessions, right breast boost 1600 cGy in 8 sessions   Hyperlipidemia    Managed with both a statin and Welchol. Welchol stopped 2014 2/2 cost and started on fenofibrate    Hypertension    2006 B renal arteries patent. 2003 MRA - no RAS. 2003 pheo W/U Dr Hassell Done reportedly negative.   Hypoxia 07/23/2017   Lupus ([place])    Lymphedema of breast    Personal history of radiation therapy 2015   RBBB    Renal vein thrombosis (HCC)    SBO (small bowel obstruction) ([place]) 09/17/2017   Secondary hyperparathyroidism ([place])    Stroke ([place])    Incidental finding MRI 2002 L lacunar infarct   Vitamin B12 deficiency    Vitamin D deficiency    Wears dentures     top    Past Surgical History:  Procedure Laterality Date   ABDOMINAL HYSTERECTOMY     BILATERAL SALPINGOOPHORECTOMY  8/03   Lap BSO (R ovarian fibroma) and adhesion lysis   BIOPSY  09/08/2020   Procedure: BIOPSY;  Surgeon: Ronald Lobo, MD;  Location: [facility];  Service: Endoscopy;;   BOWEL RESECTION  2003   ileocolectomy with anastomosis 2/2 adhesions   BREAST BIOPSY Right 2015   BREAST BIOPSY Right 07/2014   BREAST LUMPECTOMY Right 04/22/2013   BREAST LUMPECTOMY WITH NEEDLE LOCALIZATION AND AXILLARY SENTINEL LYMPH NODE BX Right 04/22/2013   Procedure: BREAST LUMPECTOMY WITH NEEDLE LOCALIZATION AND AXILLARY SENTINEL LYMPH NODE BX;  Surgeon: Stark Klein, MD;  Location: [facility];  Service: General;  Laterality: Right;   CARDIAC CATHETERIZATION     2 stents   CHOLECYSTECTOMY     COLONOSCOPY     ESOPHAGOGASTRODUODENOSCOPY N/A 09/08/2020   Procedure: ESOPHAGOGASTRODUODENOSCOPY (EGD);  Surgeon: Ronald Lobo, MD;  Location: [facility];  Service: Endoscopy;  Laterality: N/A;   HEMICOLECTOMY     R sided hemicolectomy   HERNIA REPAIR     Ventral hernia repair   PTCA  4/06     reports that she has never smoked. She has never used smokeless tobacco. She reports that she does not drink alcohol and does not use drugs.  Allergies  Allergen Reactions   Percocet [Oxycodone-Acetaminophen] Other (See Comments)    headaches   Diazepam Other (See Comments)    REACTION: Agitation Other reaction(s): agitation   Haloperidol Lactate Nausea And Vomiting   Lorazepam Nausea Only    Other reaction(s): nausea   Morphine Sulfate Other (See Comments)    REACTION: Agitation   Propoxyphene Hcl Nausea And Vomiting   Tramadol Hcl Nausea And Vomiting   Anesthetics, Halogenated     Per patient's daughter, she can not tolerate anesthetics.    Etomidate     Heart stopped during anesthesia   Fentanyl     Heart stopped during anesthesia    Haloperidol     Other reaction(s):  nausea & vomitin   Morphine Sulfate     Other reaction(s): nausea   Oxycodone-Acetaminophen     Other reaction(s): headaches   Propoxyphene     Other reaction(s): nausea & vomiting   Tramadol Hcl     Other reaction(s): nausea & vomitin Other reaction(s): nausea & vomitin   Versed [Midazolam]     Heart stopped during anesthesia    Family History  Problem Relation Age of Onset   Pancreatic cancer Brother 72   Breast cancer Mother 63   Lung cancer Maternal Aunt    Breast cancer Maternal Aunt    Prostate cancer Maternal Uncle    Heart attack Maternal Grandmother    Breast cancer Maternal Grandmother    Colon cancer Maternal Grandmother    Clotting disorder Maternal Grandmother    Diabetes Maternal Grandmother    Kidney failure Maternal Aunt    Hypertension Daughter      Prior to Admission medications   Medication Sig Start Date End Date Taking? Authorizing Provider  acetaminophen (TYLENOL) 500 MG tablet Take 1,000 mg by mouth every 6 (six) hours as needed for moderate pain or headache.    [provider]  amLODipine (NORVASC) 10 MG tablet Take 1 tablet (10 mg total) by mouth daily. Patient not taking: Reported on 11/02/2020 08/22/17   Angiulli, Lavon Paganini, PA-C  apixaban (ELIQUIS) 5 MG TABS tablet Take 1 tablet (5 mg total) by mouth 2 (two) times daily. Start after you finish the starter pack Patient taking differently: Take 5 mg by mouth 2 (two) times daily. 07/24/20   Geradine Girt, DO  budesonide (ENTOCORT EC) 3 MG 24 hr capsule Take 1 capsule by mouth once daily Patient taking differently: Take 3 mg by mouth daily. 10/04/20   Gatha Mayer, MD  Cyanocobalamin (B-12 COMPLIANCE INJECTION IJ) Inject 1 vial as directed every 30 (thirty) days.    [provider]  Epoetin Alfa-epbx (RETACRIT IJ) Inject 1 vial as directed every 30 (thirty) days.    [provider]  ergocalciferol (VITAMIN D2) 1.25 MG (50000 UT) capsule Take 1 capsule (50,000 Units total) by  mouth once a week. 08/02/20   Truitt Merle, MD  famotidine (PEPCID) 20 MG tablet Take 1 tablet (20 mg total) by mouth at bedtime. Patient taking differently: Take 40 mg by mouth at bedtime. 09/27/20   Debbe Odea, MD  feeding supplement (ENSURE ENLIVE / ENSURE PLUS)  LIQD Take 237 mLs by mouth 2 (two) times daily between meals. 11/04/20   Mariel Aloe, MD  FLUoxetine (PROZAC) 20 MG capsule Take 20 mg by mouth at bedtime.    [provider]  furosemide (LASIX) 40 MG tablet Take 1 tablet (40 mg total) by mouth daily. 11/05/20   Mariel Aloe, MD  hydrALAZINE (APRESOLINE) 100 MG tablet TAKE 1 TABLET BY MOUTH 3  TIMES DAILY Patient taking differently: Take 50 mg by mouth 2 (two) times daily. 10/24/20   Josue Hector, MD  isosorbide mononitrate (IMDUR) 30 MG 24 hr tablet TAKE 1/2 (ONE-HALF) TABLET BY MOUTH ONCE DAILY (PLEASE  SCHEDULE  AN  APPOINTMENT  FOR  FURTHER  REFILLS) Patient taking differently: Take 15 mg by mouth daily. 07/12/20   Isaiah Serge, NP  linagliptin (TRADJENTA) 5 MG TABS tablet Take 5 mg by mouth daily.    [provider]  lipase/protease/amylase (CREON) 36000 UNITS CPEP capsule Take 36,000 Units by mouth 3 (three) times daily with meals.    [provider]  loperamide (IMODIUM A-D) 2 MG tablet Take 4 mg by mouth 4 (four) times daily as needed for diarrhea or loose stools.    [provider]  Melatonin 5 MG TABS Take 10 mg by mouth at bedtime as needed (sleep).    [provider]  nebivolol (BYSTOLIC) 10 MG tablet Take 1 tablet (10 mg total) by mouth daily. 06/30/20   Mercy Riding, MD  nitroGLYCERIN (NITROSTAT) 0.4 MG SL tablet DISSOLVE 1 TABLET UNDER THE TONGUE EVERY 5 MINUTES AS  NEEDED FOR CHEST PAIN. MAX  OF 3 TABLETS IN 15 MINUTES. CALL 911 IF PAIN PERSISTS. Patient taking differently: Place 0.4 mg under the tongue every 5 (five) minutes as needed for chest pain. 10/04/20   Jerline Pain, MD  ondansetron (ZOFRAN ODT) 4 MG  disintegrating tablet Take 1 tablet (4 mg total) by mouth every 8 (eight) hours as needed for nausea or vomiting. 08/17/20   Walisiewicz, Kaitlyn E, PA-C  pantoprazole (PROTONIX) 40 MG tablet TAKE 1 TABLET BY MOUTH  DAILY BEFORE BREAKFAST Patient taking differently: Take 40 mg by mouth daily. 10/24/20   Gatha Mayer, MD  potassium chloride SA (KLOR-CON) 20 MEQ tablet Take 20 mEq by mouth daily. 09/17/20   [provider]  sucralfate (CARAFATE) 1 g tablet Take 1 g by mouth 4 (four) times daily -  with meals and at bedtime.    [provider]    Physical Exam: Vitals:   11/15/20 2100 11/15/20 2115 11/15/20 2151 11/15/20 2155  BP: (!) 162/93 (!) 156/83  (!) 175/83  Pulse: 87 86  90  Resp: 13 14  18   Temp:    98.7 F (37.1 C)  TempSrc:    Oral  SpO2: 100% 100%  100%  Weight:   59.1 kg   Height:   5' 4"  (1.626 m)     Constitutional: Awake alert and oriented x3, no associated distress.   Skin: no rashes, no lesions, extremely poor skin turgor noted. Eyes: Pupils are equally reactive to light.  No evidence of scleral icterus or conjunctival pallor.  ENMT: Somewhat dry mucous membranes noted.  Patient is edentulous.  Posterior pharynx clear of any exudate or lesions.   Neck: normal, supple, no masses, no thyromegaly.  No evidence of jugular venous distension.   Respiratory: clear to auscultation bilaterally, no wheezing, no crackles. Normal respiratory effort. No accessory muscle use.  Cardiovascular: Regular rate  and rhythm, no murmurs / rubs / gallops. No extremity edema. 2+ pedal pulses. No carotid bruits.  Chest:   Nontender without crepitus or deformity.   Back:   Nontender without crepitus or deformity. Abdomen: Abdomen is soft and nontender.  No evidence of intra-abdominal masses.  Positive bowel sounds noted in all quadrants.   Musculoskeletal: No joint deformity upper and lower extremities. Good ROM, no contractures. Normal muscle tone.  Neurologic: CN 2-12 grossly  intact. Sensation intact.  Patient moving all 4 extremities spontaneously.  Patient is following all commands.  Patient is responsive to verbal stimuli.   Psychiatric: Patient exhibits depressed mood with flat affect.  Patient seems to possess insight as to their current situation.     Labs on Admission: I have personally reviewed following labs and imaging studies -   CBC: Recent Labs  Lab 11/15/20 1330  WBC 7.2  NEUTROABS 5.9  HGB 10.8*  HCT 33.2*  MCV 82.2  PLT 353   Basic Metabolic Panel: Recent Labs  Lab 11/15/20 1330  NA 139  K 3.4*  CL 108  CO2 18*  GLUCOSE 124*  BUN 30*  CREATININE 2.58*  CALCIUM 9.1   GFR: Estimated Creatinine Clearance: 16.8 mL/min (A) (by C-G formula based on SCr of 2.58 mg/dL (H)). Liver Function Tests: Recent Labs  Lab 11/15/20 1330  AST 55*  ALT 44  ALKPHOS 76  BILITOT 0.7  PROT 6.9  ALBUMIN 3.2*   No results for input(s): LIPASE, AMYLASE in the last 168 hours. No results for input(s): AMMONIA in the last 168 hours. Coagulation Profile: No results for input(s): INR, PROTIME in the last 168 hours. Cardiac Enzymes: No results for input(s): CKTOTAL, CKMB, CKMBINDEX, TROPONINI in the last 168 hours. BNP (last 3 results) No results for input(s): PROBNP in the last 8760 hours. HbA1C: No results for input(s): HGBA1C in the last 72 hours. CBG: No results for input(s): GLUCAP in the last 168 hours. Lipid Profile: No results for input(s): CHOL, HDL, LDLCALC, TRIG, CHOLHDL, LDLDIRECT in the last 72 hours. Thyroid Function Tests: Recent Labs    11/15/20 1827  TSH 1.694   Anemia Panel: No results for input(s): VITAMINB12, FOLATE, FERRITIN, TIBC, IRON, RETICCTPCT in the last 72 hours. Urine analysis:    Component Value Date/Time   COLORURINE COLORLESS (A) 11/02/2020 0027   APPEARANCEUR CLEAR 11/02/2020 0027   APPEARANCEUR Clear 08/22/2018 1620   LABSPEC 1.009 11/02/2020 0027   LABSPEC 1.020 03/03/2014 1341   PHURINE 5.0  11/02/2020 0027   GLUCOSEU NEGATIVE 11/02/2020 0027   GLUCOSEU Negative 03/03/2014 1341   HGBUR NEGATIVE 11/02/2020 0027   BILIRUBINUR NEGATIVE 11/02/2020 0027   BILIRUBINUR Negative 08/22/2018 1620   BILIRUBINUR Negative 03/03/2014 1341   KETONESUR NEGATIVE 11/02/2020 0027   PROTEINUR TRACE (A) 11/02/2020 0027   UROBILINOGEN 0.2 03/03/2014 1341   NITRITE NEGATIVE 11/02/2020 0027   LEUKOCYTESUR NEGATIVE 11/02/2020 0027   LEUKOCYTESUR Negative 03/03/2014 1341    Radiological Exams on Admission - Personally Reviewed: DG Chest 2 View  Result Date: 11/15/2020 CLINICAL DATA:  weakness EXAM: CHEST - 2 VIEW COMPARISON:  None. FINDINGS: Vascular stent overlying the heart. The cardiomediastinal silhouette is within normal limits. No pleural effusion. No pneumothorax. No mass or consolidation. No acute osseous abnormality. Right axillary surgical clips. IMPRESSION: No acute abnormality in the chest. Electronically Signed   By: Albin Felling M.D.   On: 11/15/2020 14:38    EKG: Personally reviewed.  Rhythm is normal sinus rhythm with heart rate of  88 bpm.  No dynamic ST segment changes appreciated.  Assessment/Plan Principal Problem:   Shortness of breath  Patient reports sudden onset shortness of breath earlier today.  On my examination, shortness of breath is improved somewhat but is still present. Chest x-ray unremarkable with no evidence of pneumonia or edema.  Patient is not hypoxic or in respiratory distress on my assessment. No clinical evidence of cardiogenic volume overload. That being said, patient has a known history of multiple embolic events bring about concern for sudden recurrence. Due to advanced renal disease, ER provider ordered a VQ scan which probably will not be performed to the morning and will be followed closely.   I do not feel compelled enough to place patient on empiric heparin therapy however.   Continue to monitor patient closely on  telemetry.  Lightheadedness  Patient appears clinically volume depleted Family reports patient's continued extremely poor oral intake.  This combined with ongoing Lasix use is likely resulted in some degree of volume depletion Gentle intravenous hydration overnight Orthostatic vital signs tomorrow morning  Active Problems:   Elevated troponin  Notable slight elevation in troponin, initially being 47 followed by 55 No associated dynamic ST segment change on EKG. Patient has remained chest pain-free throughout the presentation Possibly related to advanced renal disease however troponins during previous hospitalizations seem to have been normal earlier this year. Continuing to cycle cardiac enzymes, if troponins continue to trend upwards throughout the evening or if patient begins to experience EKG changes or chest discomfort will consult cardiology in the morning. Continue home regimen of beta-blocker Daily aspirin Lipid panel in the morning.   Unintentional weight loss  Family reports substantial weight loss of approximately 60 pounds in the past several months with associated generalized malaise and poor appetite Patient seems to have already undergone substantial work-up for potential sources of malignancy up to this point. Patient is undergone 2 CT images of the abdomen and pelvis in the past several months both of which were unremarkable EGD performed in June was relatively unremarkable with biopsies that revealed no evidence of malignancy Right breast biopsy performed in May again revealed no evidence of malignancy Ongoing weight loss seems to be secondary to failure to thrive as patient appears to be quite apathetic about her ongoing malnutrition and lack of appetite Obtaining nutrition evaluation, calorie count Family has inquired about TPN however I do not believe that this is wise this would place patient at unnecessarily high risk of infection. PT eval.    CKD (chronic  kidney disease), stage IV (HCC)  Strict intake and output monitoring Creatinine is actually better than baseline currently at 2.58. Minimizing nephrotoxic agents as much as possible Serial chemistries to monitor renal function and electrolytes    Type 2 diabetes mellitus with stage 4 chronic kidney disease, without long-term current use of insulin (HCC)  Accu-Cheks before every meal and nightly with sliding scale insulin Hemoglobin A1c 5.5% in June Holding home regimen of oral hypoglycemics    Chronic diastolic CHF (congestive heart failure) (HCC)  No evidence of cardiogenic volume overload    GERD without esophagitis  Continue home regimen of PPI   Code Status:  Full code Family Communication: Plan of care discussed with both granddaughter at the bedside and daughter via phone conversation.  Status is: Observation  The patient remains OBS appropriate and will d/c before 2 midnights.  Dispo: The patient is from: Home              Anticipated  d/c is to: Home              Patient currently is not medically stable to d/c.   Difficult to place patient No        Vernelle Emerald MD [facility] Pager [phone]  If 7PM-7AM, please contact night-coverage [email or web address] Use universal [place] password for that web site. If you do not have the password, please call the hospital operator.  11/15/2020, 11:30 PM

## 2020-11-15 NOTE — ED Provider Notes (Signed)
Emergency Medicine Provider Triage Evaluation Note  [name] , a 75 y.o. female  was evaluated in triage.  Pt complains of sob that started today. Family at bedside further reports pt recently dx with covid and has not been eating or drinking recently and has been very weak. They are concerned for sepsis.  Review of Systems  Positive: Sob, weakness Negative: vomiting  Physical Exam  BP (!) 151/70 (BP Location: Left Arm)   Pulse 92   Temp 99 F (37.2 C) (Oral)   Resp 14   SpO2 100%  Gen:   Awake, no distress   Resp:  Normal effort  MSK:   Moves extremities without difficulty  Other:  Mucous membranes are dry  Medical Decision Making  Medically screening exam initiated at 1:18 PM.  Appropriate orders placed.  [name] was informed that the remainder of the evaluation will be completed by another provider, this initial triage assessment does not replace that evaluation, and the importance of remaining in the ED until their evaluation is complete.    Bishop Dublin 11/15/20 1321    Truddie Hidden, MD 11/15/20 [phone]

## 2020-11-15 NOTE — ED Triage Notes (Signed)
Pt arrives EMS from home. Pt sitting on porch with hyperventilation. Pt endorses SOB today. History of DVT. No distress noted during triage.    178/94 P 104 100% RA CBG 214

## 2020-11-15 NOTE — ED Notes (Signed)
Provided pt with snack and drink per MD approval.

## 2020-11-15 NOTE — ED Notes (Signed)
IV team at bedside 

## 2020-11-16 ENCOUNTER — Ambulatory Visit: Payer: Self-pay | Admitting: *Deleted

## 2020-11-16 ENCOUNTER — Observation Stay (HOSPITAL_BASED_OUTPATIENT_CLINIC_OR_DEPARTMENT_OTHER): Payer: Medicare Other

## 2020-11-16 ENCOUNTER — Observation Stay (HOSPITAL_COMMUNITY): Payer: Medicare Other

## 2020-11-16 DIAGNOSIS — I5032 Chronic diastolic (congestive) heart failure: Secondary | ICD-10-CM | POA: Diagnosis not present

## 2020-11-16 DIAGNOSIS — R079 Chest pain, unspecified: Secondary | ICD-10-CM | POA: Diagnosis not present

## 2020-11-16 DIAGNOSIS — R609 Edema, unspecified: Secondary | ICD-10-CM

## 2020-11-16 DIAGNOSIS — N184 Chronic kidney disease, stage 4 (severe): Secondary | ICD-10-CM | POA: Diagnosis not present

## 2020-11-16 DIAGNOSIS — I2510 Atherosclerotic heart disease of native coronary artery without angina pectoris: Secondary | ICD-10-CM | POA: Diagnosis not present

## 2020-11-16 DIAGNOSIS — R0602 Shortness of breath: Secondary | ICD-10-CM | POA: Diagnosis not present

## 2020-11-16 LAB — URINALYSIS, ROUTINE W REFLEX MICROSCOPIC
Bilirubin Urine: NEGATIVE
Glucose, UA: NEGATIVE mg/dL
Hgb urine dipstick: NEGATIVE
Ketones, ur: NEGATIVE mg/dL
Leukocytes,Ua: NEGATIVE
Nitrite: NEGATIVE
Protein, ur: 30 mg/dL — AB
Specific Gravity, Urine: 1.011 (ref 1.005–1.030)
pH: 5 (ref 5.0–8.0)

## 2020-11-16 LAB — COMPREHENSIVE METABOLIC PANEL
ALT: 35 U/L (ref 0–44)
AST: 33 U/L (ref 15–41)
Albumin: 2.8 g/dL — ABNORMAL LOW (ref 3.5–5.0)
Alkaline Phosphatase: 71 U/L (ref 38–126)
Anion gap: 9 (ref 5–15)
BUN: 26 mg/dL — ABNORMAL HIGH (ref 8–23)
CO2: 18 mmol/L — ABNORMAL LOW (ref 22–32)
Calcium: 8.8 mg/dL — ABNORMAL LOW (ref 8.9–10.3)
Chloride: 113 mmol/L — ABNORMAL HIGH (ref 98–111)
Creatinine, Ser: 2.26 mg/dL — ABNORMAL HIGH (ref 0.44–1.00)
GFR, Estimated: 22 mL/min — ABNORMAL LOW (ref >60)
Glucose, Bld: 92 mg/dL (ref 70–99)
Potassium: 2.9 mmol/L — ABNORMAL LOW (ref 3.5–5.1)
Sodium: 140 mmol/L (ref 135–145)
Total Bilirubin: 0.7 mg/dL (ref 0.3–1.2)
Total Protein: 6.1 g/dL — ABNORMAL LOW (ref 6.5–8.1)

## 2020-11-16 LAB — CBC WITH DIFFERENTIAL/PLATELET
Abs Immature Granulocytes: 0.05 10*3/uL (ref 0.00–0.07)
Basophils Absolute: 0 10*3/uL (ref 0.0–0.1)
Basophils Relative: 0 %
Eosinophils Absolute: 0 10*3/uL (ref 0.0–0.5)
Eosinophils Relative: 0 %
HCT: 29.4 % — ABNORMAL LOW (ref 36.0–46.0)
Hemoglobin: 9.7 g/dL — ABNORMAL LOW (ref 12.0–15.0)
Immature Granulocytes: 1 %
Lymphocytes Relative: 18 %
Lymphs Abs: 1.5 10*3/uL (ref 0.7–4.0)
MCH: 26.3 pg (ref 26.0–34.0)
MCHC: 33 g/dL (ref 30.0–36.0)
MCV: 79.7 fL — ABNORMAL LOW (ref 80.0–100.0)
Monocytes Absolute: 0.8 10*3/uL (ref 0.1–1.0)
Monocytes Relative: 9 %
Neutro Abs: 6.2 10*3/uL (ref 1.7–7.7)
Neutrophils Relative %: 72 %
Platelets: 192 10*3/uL (ref 150–400)
RBC: 3.69 MIL/uL — ABNORMAL LOW (ref 3.87–5.11)
RDW: 16.5 % — ABNORMAL HIGH (ref 11.5–15.5)
WBC: 8.6 10*3/uL (ref 4.0–10.5)
nRBC: 0 % (ref 0.0–0.2)

## 2020-11-16 LAB — APTT: aPTT: 93 seconds — ABNORMAL HIGH (ref 24–36)

## 2020-11-16 LAB — LIPID PANEL
Cholesterol: 117 mg/dL (ref 0–200)
HDL: 59 mg/dL (ref >40)
LDL Cholesterol: 40 mg/dL (ref 0–99)
Total CHOL/HDL Ratio: 2 RATIO
Triglycerides: 89 mg/dL (ref <150)
VLDL: 18 mg/dL (ref 0–40)

## 2020-11-16 LAB — GLUCOSE, CAPILLARY
Glucose-Capillary: 109 mg/dL — ABNORMAL HIGH (ref 70–99)
Glucose-Capillary: 97 mg/dL (ref 70–99)
Glucose-Capillary: 174 mg/dL — ABNORMAL HIGH (ref 70–99)
Glucose-Capillary: 70 mg/dL (ref 70–99)

## 2020-11-16 LAB — TROPONIN I (HIGH SENSITIVITY): Troponin I (High Sensitivity): 66 ng/L — ABNORMAL HIGH (ref <18)

## 2020-11-16 LAB — MRSA NEXT GEN BY PCR, NASAL: MRSA by PCR Next Gen: NOT DETECTED

## 2020-11-16 LAB — HEPARIN LEVEL (UNFRACTIONATED): Heparin Unfractionated: >1.1 IU/mL — ABNORMAL HIGH (ref 0.30–0.70)

## 2020-11-16 LAB — D-DIMER, QUANTITATIVE: D-Dimer, Quant: 9.17 ug/mL-FEU — ABNORMAL HIGH (ref 0.00–0.50)

## 2020-11-16 LAB — MAGNESIUM: Magnesium: 1.1 mg/dL — ABNORMAL LOW (ref 1.7–2.4)

## 2020-11-16 LAB — CORTISOL: Cortisol, Plasma: 20.1 ug/dL

## 2020-11-16 MED ORDER — HEPARIN BOLUS VIA INFUSION
4000.0000 [IU] | Freq: Once | INTRAVENOUS | Status: AC
  Administered 2020-11-16: 4000 [IU] via INTRAVENOUS
  Filled 2020-11-16: qty 4000

## 2020-11-16 MED ORDER — BOOST / RESOURCE BREEZE PO LIQD CUSTOM
1.0000 | Freq: Three times a day (TID) | ORAL | Status: DC
  Administered 2020-11-16 – 2020-11-19 (×8): 1 via ORAL

## 2020-11-16 MED ORDER — HEPARIN SODIUM (PORCINE) 5000 UNIT/ML IJ SOLN
5000.0000 [IU] | Freq: Three times a day (TID) | INTRAMUSCULAR | Status: AC
  Administered 2020-11-16 – 2020-11-17 (×5): 5000 [IU] via SUBCUTANEOUS
  Filled 2020-11-16 (×5): qty 1

## 2020-11-16 MED ORDER — ASPIRIN EC 81 MG PO TBEC
81.0000 mg | DELAYED_RELEASE_TABLET | Freq: Every day | ORAL | Status: DC
  Administered 2020-11-16 – 2020-11-17 (×2): 81 mg via ORAL
  Filled 2020-11-16 (×2): qty 1

## 2020-11-16 MED ORDER — MAGNESIUM SULFATE 4 GM/100ML IV SOLN
4.0000 g | Freq: Once | INTRAVENOUS | Status: AC
  Administered 2020-11-16: 4 g via INTRAVENOUS
  Filled 2020-11-16: qty 100

## 2020-11-16 MED ORDER — FUROSEMIDE 10 MG/ML IJ SOLN
60.0000 mg | Freq: Two times a day (BID) | INTRAMUSCULAR | Status: DC
  Administered 2020-11-16: 60 mg via INTRAVENOUS
  Filled 2020-11-16 (×2): qty 6

## 2020-11-16 MED ORDER — HEPARIN (PORCINE) 25000 UT/250ML-% IV SOLN: 850.0000 [IU]/h | INTRAVENOUS | Status: DC

## 2020-11-16 MED ORDER — TECHNETIUM TO 99M ALBUMIN AGGREGATED
4.3000 | Freq: Once | INTRAVENOUS | Status: AC | PRN
  Administered 2020-11-16: 4.3 via INTRAVENOUS

## 2020-11-16 MED ORDER — HEPARIN (PORCINE) 25000 UT/250ML-% IV SOLN
950.0000 [IU]/h | INTRAVENOUS | Status: DC
  Administered 2020-11-16: 950 [IU]/h via INTRAVENOUS
  Filled 2020-11-16: qty 250

## 2020-11-16 MED ORDER — POTASSIUM CHLORIDE CRYS ER 20 MEQ PO TBCR
20.0000 meq | EXTENDED_RELEASE_TABLET | Freq: Once | ORAL | Status: AC
  Administered 2020-11-16: 20 meq via ORAL
  Filled 2020-11-16: qty 1

## 2020-11-16 NOTE — Progress Notes (Addendum)
PROGRESS NOTE        PATIENT DETAILS Name: [name] Age: 75 y.o. Sex: female Date of Birth: 04-11-1945 Admit Date: 11/15/2020 Admitting Physician Vernelle Emerald, MD MBE:[id number], Jori Moll, MD  Brief Narrative: Patient is a 75 y.o. female with history of CAD, HF PEF, right breast cancer-s/p lumpectomy, right renal vein thrombosis (2019), RLE DVT (06/2020-s/p 3 months of Eliquis), CKD stage IV-recent COVID-19 infection-who presented to the ED with shortness of breath and lightheadedness.  She was subsequently admitted to the hospitalist service for further evaluation and treatment.  Significant events: 8/30>> admit for evaluation of SOB/lightheadedness.  Significant studies: 8/30>> CXR: No mass/consolidation.  Antimicrobial therapy: None  Microbiology data: 8/16>> COVID/influenza PCR: Positive 8/30>> COVID/influenza PCR: Negative  Procedures : None  Consults: None  DVT Prophylaxis : IV heparin  Subjective: Think she feels better-has 1+ pitting edema in her legs.  Not on oxygen.  Denies any pleuritic chest pain.  Assessment/Plan: Shortness of breath: Unclear etiology-chest x-ray without obvious infiltrates-given history of VTE-recent COVID-19 infection-significantly elevated D-dimer-empirically on IV heparin until VQ scan/Doppler studies can be obtained.  She does have some mild lower extremity edema as well-and does have a history of chronic diastolic heart failure-if VTE studies are negative-suspect she may have had some mild volume overload issues causing shortness of breath.  She apparently takes Lasix at home-I will go ahead and place on IV Lasix.  Follow volume status closely.  Lightheadedness: Unclear etiology-telemetry negative-obtain orthostatic vital signs.  Recent echo in April 2022 was with preserved EF.  HFpEF with mild exacerbation: See above regarding diuretics.  CKD stage IV: Creatinine close to baseline-watch closely-avoid  nephrotoxic agents.  Hypomagnesemia/hypokalemia: Replete and recheck.  Anemia: Due to CKD-on erythropoietin injections as an outpatient.  DM-2 (A1c 5.21 August 2020): CBGs stable with SSI.  Resume Tradjenta on discharge.  Recent Labs    11/16/20 0728  GLUCAP 109*    HTN: BP stable-continue hydralazine, Imdur, Bystolic  History of VTE (RLE DVT April 2022/right renal vein thrombosis 2019): No longer on anticoagulation  History of ?Crohn's disease s/p multiple colectomy: Stable-continue Enterocort  GERD: Continue PPI  History of breast cancer-s/p right lumpectomy: Follows with oncology  History of anxiety/depression: Continue fluoxetine  Unintentional weight loss: TSH unremarkable-numerous recent imaging studies/endoscopies were all negative-defer further work-up to the outpatient work-up.  Diet: Diet Order             Diet heart healthy/carb modified Room service appropriate? Yes; Fluid consistency: Thin  Diet effective now                    Code Status: Full code   Family Communication: None at bedside-patient will update family herself.  Disposition Plan: Status is: Observation  The patient will require care spanning > 2 midnights and should be moved to inpatient because: Inpatient level of care appropriate due to severity of illness  Dispo: The patient is from: Home              Anticipated d/c is to: Home              Patient currently is not medically stable to d/c.   Difficult to place patient No    Barriers to Discharge: Shortness of breath-HFpEF with exacerbation-ruling out VTE-on IV heparin-see above note  Antimicrobial agents: Anti-infectives (From admission, onward)  None        Time spent: 35 minutes-Greater than 50% of this time was spent in counseling, explanation of diagnosis, planning of further management, and coordination of care.  MEDICATIONS: Scheduled Meds:  aspirin EC  81 mg Oral Daily   budesonide  3 mg Oral Q breakfast    feeding supplement  237 mL Oral BID BM   FLUoxetine  20 mg Oral QHS   hydrALAZINE  100 mg Oral Q8H   insulin aspart  0-15 Units Subcutaneous TID AC & HS   isosorbide mononitrate  15 mg Oral Daily   lipase/protease/amylase  36,000 Units Oral TID WC   nebivolol  10 mg Oral Daily   pantoprazole  40 mg Oral Daily   potassium chloride  20 mEq Oral Once   sucralfate  1 g Oral TID WC & HS   Continuous Infusions:  heparin 950 Units/hr (11/16/20 0222)   lactated ringers 100 mL/hr at 11/15/20 2349   PRN Meds:.acetaminophen **OR** acetaminophen, melatonin, ondansetron **OR** ondansetron (ZOFRAN) IV, polyethylene glycol   PHYSICAL EXAM: Vital signs: Vitals:   11/15/20 2300 11/16/20 0300 11/16/20 0500 11/16/20 0800  BP: (!) 143/76 (!) 121/56 134/83 127/60  Pulse: 83 79 77 78  Resp: 13 17 20 20   Temp: 98.4 F (36.9 C) 98.2 F (36.8 C)  98.1 F (36.7 C)  TempSrc: Oral Oral  Oral  SpO2: 100% 99% 98% 97%  Weight:      Height:       Filed Weights   11/15/20 1621 11/15/20 2151  Weight: 65.2 kg 59.1 kg   Body mass index is 22.36 kg/m.   Gen Exam:Alert awake-not in any distress HEENT:atraumatic, normocephalic Chest: B/L clear to auscultation anteriorly CVS:S1S2 regular Abdomen:soft non tender, non distended Extremities:+ edema Neurology: Non focal Skin: no rash  I have personally reviewed following labs and imaging studies  LABORATORY DATA: CBC: Recent Labs  Lab 11/15/20 1330 11/16/20 0038  WBC 7.2 8.6  NEUTROABS 5.9 6.2  HGB 10.8* 9.7*  HCT 33.2* 29.4*  MCV 82.2 79.7*  PLT 206 163    Basic Metabolic Panel: Recent Labs  Lab 11/15/20 1330 11/16/20 0038  NA 139 140  K 3.4* 2.9*  CL 108 113*  CO2 18* 18*  GLUCOSE 124* 92  BUN 30* 26*  CREATININE 2.58* 2.26*  CALCIUM 9.1 8.8*  MG  --  1.1*    GFR: Estimated Creatinine Clearance: 19.1 mL/min (A) (by C-G formula based on SCr of 2.26 mg/dL (H)).  Liver Function Tests: Recent Labs  Lab 11/15/20 1330  11/16/20 0038  AST 55* 33  ALT 44 35  ALKPHOS 76 71  BILITOT 0.7 0.7  PROT 6.9 6.1*  ALBUMIN 3.2* 2.8*   No results for input(s): LIPASE, AMYLASE in the last 168 hours. No results for input(s): AMMONIA in the last 168 hours.  Coagulation Profile: No results for input(s): INR, PROTIME in the last 168 hours.  Cardiac Enzymes: No results for input(s): CKTOTAL, CKMB, CKMBINDEX, TROPONINI in the last 168 hours.  BNP (last 3 results) No results for input(s): PROBNP in the last 8760 hours.  Lipid Profile: Recent Labs    11/16/20 0119  CHOL 117  HDL 59  LDLCALC 40  TRIG 89  CHOLHDL 2.0    Thyroid Function Tests: Recent Labs    11/15/20 1827  TSH 1.694    Anemia Panel: No results for input(s): VITAMINB12, FOLATE, FERRITIN, TIBC, IRON, RETICCTPCT in the last 72 hours.  Urine analysis:  Component Value Date/Time   COLORURINE COLORLESS (A) 11/02/2020 0027   APPEARANCEUR CLEAR 11/02/2020 0027   APPEARANCEUR Clear 08/22/2018 1620   LABSPEC 1.009 11/02/2020 0027   LABSPEC 1.020 03/03/2014 1341   PHURINE 5.0 11/02/2020 0027   GLUCOSEU NEGATIVE 11/02/2020 0027   GLUCOSEU Negative 03/03/2014 1341   HGBUR NEGATIVE 11/02/2020 0027   BILIRUBINUR NEGATIVE 11/02/2020 0027   BILIRUBINUR Negative 08/22/2018 1620   BILIRUBINUR Negative 03/03/2014 1341   KETONESUR NEGATIVE 11/02/2020 0027   PROTEINUR TRACE (A) 11/02/2020 0027   UROBILINOGEN 0.2 03/03/2014 1341   NITRITE NEGATIVE 11/02/2020 0027   LEUKOCYTESUR NEGATIVE 11/02/2020 0027   LEUKOCYTESUR Negative 03/03/2014 1341    Sepsis Labs: Lactic Acid, Venous    Component Value Date/Time   LATICACIDVEN 1.4 11/15/2020 1835    MICROBIOLOGY: Recent Results (from the past 240 hour(s))  Resp Panel by RT-PCR (Flu A&B, Covid) Nasopharyngeal Swab     Status: None   Collection Time: 11/15/20  6:05 PM   Specimen: Nasopharyngeal Swab; Nasopharyngeal(NP) swabs in vial transport medium  Result Value Ref Range Status   SARS  Coronavirus 2 by RT PCR NEGATIVE NEGATIVE Final    Comment: (NOTE) SARS-CoV-2 target nucleic acids are NOT DETECTED.  The SARS-CoV-2 RNA is generally detectable in upper respiratory specimens during the acute phase of infection. The lowest concentration of SARS-CoV-2 viral copies this assay can detect is 138 copies/mL. A negative result does not preclude SARS-Cov-2 infection and should not be used as the sole basis for treatment or other patient management decisions. A negative result may occur with  improper specimen collection/handling, submission of specimen other than nasopharyngeal swab, presence of viral mutation(s) within the areas targeted by this assay, and inadequate number of viral copies(<138 copies/mL). A negative result must be combined with clinical observations, patient history, and epidemiological information. The expected result is Negative.  Fact Sheet for Patients:  EntrepreneurPulse.com.au  Fact Sheet for Healthcare Providers:  IncredibleEmployment.be  This test is no t yet approved or cleared by the [place] FDA and  has been authorized for detection and/or diagnosis of SARS-CoV-2 by FDA under an Emergency Use Authorization (EUA). This EUA will remain  in effect (meaning this test can be used) for the duration of the COVID-19 declaration under Section 564(b)(1) of the Act, 21 U.S.C.section 360bbb-3(b)(1), unless the authorization is terminated  or revoked sooner.       Influenza A by PCR NEGATIVE NEGATIVE Final   Influenza B by PCR NEGATIVE NEGATIVE Final    Comment: (NOTE) The Xpert Xpress SARS-CoV-2/FLU/RSV plus assay is intended as an aid in the diagnosis of influenza from Nasopharyngeal swab specimens and should not be used as a sole basis for treatment. Nasal washings and aspirates are unacceptable for Xpert Xpress SARS-CoV-2/FLU/RSV testing.  Fact Sheet for  Patients: EntrepreneurPulse.com.au  Fact Sheet for Healthcare Providers: IncredibleEmployment.be  This test is not yet approved or cleared by the [place] FDA and has been authorized for detection and/or diagnosis of SARS-CoV-2 by FDA under an Emergency Use Authorization (EUA). This EUA will remain in effect (meaning this test can be used) for the duration of the COVID-19 declaration under Section 564(b)(1) of the Act, 21 U.S.C. section 360bbb-3(b)(1), unless the authorization is terminated or revoked.  Performed at [facility], [place] [address] [place],  [address]   MRSA Next Gen by PCR, Nasal     Status: None   Collection Time: 11/15/20  9:59 PM   Specimen: Nasal Mucosa; Nasal Swab  Result Value Ref Range Status   MRSA by PCR Next Gen NOT DETECTED NOT DETECTED Final    Comment: (NOTE) The GeneXpert MRSA Assay (FDA approved for NASAL specimens only), is one component of a comprehensive MRSA colonization surveillance program. It is not intended to diagnose MRSA infection nor to guide or monitor treatment for MRSA infections. Test performance is not FDA approved in patients less than 63 years old. Performed at [facility], [place] [address], [place], [place] [address]     RADIOLOGY STUDIES/RESULTS: DG Chest 2 View  Result Date: 11/15/2020 CLINICAL DATA:  weakness EXAM: CHEST - 2 VIEW COMPARISON:  None. FINDINGS: Vascular stent overlying the heart. The cardiomediastinal silhouette is within normal limits. No pleural effusion. No pneumothorax. No mass or consolidation. No acute osseous abnormality. Right axillary surgical clips. IMPRESSION: No acute abnormality in the chest. Electronically Signed   By: Albin Felling M.D.   On: 11/15/2020 14:38     LOS: 0 days   Oren Binet, MD  [facility]    To contact the attending provider between 7A-7P or the covering provider during after hours 7P-7A, please log into  the web site [email or web address] and access using universal [place] password for that web site. If you do not have the password, please call the hospital operator.  11/16/2020, 9:03 AM

## 2020-11-16 NOTE — Plan of Care (Signed)
Pt admitted for SOB, BP (!) 143/76 (BP Location: Left Arm)   Pulse 83   Temp 98.4 F (36.9 C) (Oral)   Resp 13   Ht 5' 4"  (1.626 m)   Wt 59.1 kg   SpO2 100%   BMI 22.36 kg/m  Pt had no s/s of SOB during admission. Pt started on LR@100 , tele NSR. Pt took meds whole with apple sauce. No skin issues noted. No c/o pain. Pt oriented to the floor, call bell near by, bed alarm on, 2/4 rails up. No further needs voiced at this time.  Judith Blonder 11/16/20 12:58 AM

## 2020-11-16 NOTE — Progress Notes (Signed)
Pt is refusing breakfast as well as morning ensure, pt states she doesn't like the taste of boost either but is willing to try a magic cup  MD aware

## 2020-11-16 NOTE — Evaluation (Addendum)
Physical Therapy Evaluation Patient Details Name: [name] MRN: [id number] DOB: 02-18-1946 Today's Date: 11/16/2020   History of Present Illness  Pt is 75 yo female who presented with shortness of breath and lightheadedness on 11/15/20.  Pt with hx including recent COVID 77, CAD, HF, R breast CA s/p lumpectomy, R renal vein thrombosis, R LE DVT, and CKD.  Clinical Impression   Note: Updated note as daughter reached out to therapist and  pt had provided incorrect information and has had frequent falls  Pt admitted with above diagnosis. Up until 4 months ago pt was independent.  She has gradually declined over the past few months with frequent falls and requiring assist with ADLs and walking with RW/rollator.   Had support at home from grandson but he is now back at school.  Today, pt able to ambulate in room with min assist and fatigued easily.  She did have orthostatic hypotension but was asymptomatic. Pt could benefit from further rehab due to decline from baseline and decreased safety. Daughter requesting CIR if possible. Pt currently with functional limitations due to the deficits listed below (see PT Problem List). Pt will benefit from skilled PT to increase their independence and safety with mobility to allow discharge to the venue listed below.    Orthostatic [name]  Supine 132/68  Sitting 112/66 asymptomatic     Standing 107/54 asymptomatic  Return to supine 119/72       Follow Up Recommendations CIR    Equipment Recommendations  Rolling walker with 5" wheels    Recommendations for Other Services Rehab consult     Precautions / Restrictions Precautions Precautions: Fall      Mobility  Bed Mobility Overal bed mobility: Needs Assistance Bed Mobility: Supine to Sit     Supine to sit: Supervision     General bed mobility comments: increased time but no assist needed    Transfers Overall transfer level: Needs assistance Equipment used: None Transfers: Stand  Pivot Transfers Sit to Stand: Min guard Stand pivot transfers: Min guard       General transfer comment: Min guard for safety; performed sit to stand x 4 during sesion  Ambulation/Gait Ambulation/Gait assistance: Herbalist (Feet): 60 Feet Assistive device: None Gait Pattern/deviations: Decreased stride length;Step-through pattern Gait velocity: decreased   General Gait Details: Ambulated in room (did not want to go in hall b/c cold), min assist for safety, fatigued easily, vitals monitored see comments  Stairs            Wheelchair Mobility    Modified Rankin (Stroke Patients Only)       Balance Overall balance assessment: Needs assistance Sitting-balance support: No upper extremity supported Sitting balance-Leahy Scale: Good     Standing balance support: No upper extremity supported Standing balance-Leahy Scale: Fair Standing balance comment: Did ambulate without AD but unsteady and hx of falls                             Pertinent Vitals/Pain Pain Assessment: No/denies pain    Home Living Family/patient expects to be discharged to:: Private residence Living Arrangements: Alone Available Help at Discharge: Family;Available PRN/intermittently Type of Home: House Home Access: Level entry     Home Layout: Two level;Able to live on main level with bedroom/bathroom Home Equipment: Gilford Rile - 2 wheels;Walker - 4 wheels Additional Comments: Daughter provided/corrected hx after session    Prior Function Level of Independence: Needs assistance  Gait / Transfers Assistance Needed: Pt was ambulatory but falling about 1 x  per month; Using a RW or rollator lately.  Lately requiring assist to get up stairs.  ADL's / Homemaking Assistance Needed: Lately: Performs baths, brushing teeth, dressing on her own but needs assist with combing hair and IADLs and toileting.  Comments: Pt was independent until 4 months ago.     Hand Dominance         Extremity/Trunk Assessment   Upper Extremity Assessment Upper Extremity Assessment: LUE deficits/detail;RUE deficits/detail RUE Deficits / Details: ROM : overall WFL, mild limitation in shoulder; MMT: 4+/5 LUE Deficits / Details: ROM: elbow/hand WFL, shoulder - very limited (~10-20 degrees flex) baseline; MMT: elbow/hand 4+/5, shoulder 2/5    Lower Extremity Assessment Lower Extremity Assessment: Overall WFL for tasks assessed (ROM WFL; MMT 5/5)    Cervical / Trunk Assessment Cervical / Trunk Assessment: Normal  Communication      Cognition Arousal/Alertness: Awake/alert Behavior During Therapy: Flat affect Overall Cognitive Status: Impaired/Different from baseline                                 General Comments: Pt with flat affect and slow to respond at times. Oriented x 3.  Spoke with dtr later and pt with confusion and did not provide accurate PLOF/hx      General Comments General comments (skin integrity, edema, etc.): Pt on RA with O2 98%    Exercises     Assessment/Plan    PT Assessment Patient needs continued PT services  PT Problem List Decreased strength;Decreased mobility;Decreased safety awareness;Decreased range of motion;Cardiopulmonary status limiting activity;Decreased balance;Decreased knowledge of use of DME       PT Treatment Interventions DME instruction;Therapeutic activities;Gait training;Therapeutic exercise;Patient/family education;Balance training;Functional mobility training;Stair training    PT Goals (Current goals can be found in the Care Plan section)  Acute Rehab PT Goals Patient Stated Goal: return home (pt); daughter request rehab due to frequent falls, decreased mobility/safety PT Goal Formulation: With patient Time For Goal Achievement: 11/30/20 Potential to Achieve Goals: Good    Frequency Min 3X/week   Barriers to discharge Decreased caregiver support      Co-evaluation               AM-PAC PT "6  Clicks" Mobility  Outcome Measure Help needed turning from your back to your side while in a flat bed without using bedrails?: None Help needed moving from lying on your back to sitting on the side of a flat bed without using bedrails?: A Little Help needed moving to and from a bed to a chair (including a wheelchair)?: A Little Help needed standing up from a chair using your arms (e.g., wheelchair or bedside chair)?: A Little Help needed to walk in hospital room?: A Little Help needed climbing 3-5 steps with a railing? : A Little 6 Click Score: 4    End of Session Equipment Utilized During Treatment: Gait belt Activity Tolerance: Patient tolerated treatment well Patient left: in bed;with call bell/phone within reach;with bed alarm set Nurse Communication: Mobility status (orthostatic bp but asymptomatic) PT Visit Diagnosis: Other abnormalities of gait and mobility (R26.89);Muscle weakness (generalized) (M62.81);Repeated falls (R29.6)    Time: 1601-0932 PT Time Calculation (min) (ACUTE ONLY): 30 min   Charges:   PT Evaluation $PT Eval Low Complexity: 1 Low PT Treatments $Gait Training: 8-22 mins        Kanna Dafoe, PT Acute  Rehab Services Pager [phone] [facility] [place] 11/16/2020, 6:19 PM

## 2020-11-16 NOTE — Progress Notes (Addendum)
[place] for Heparin Indication: possible pulmonary embolism  Allergies  Allergen Reactions   Percocet [Oxycodone-Acetaminophen] Other (See Comments)    headaches   Diazepam Other (See Comments)    REACTION: Agitation Other reaction(s): agitation   Haloperidol Lactate Nausea And Vomiting   Lorazepam Nausea Only    Other reaction(s): nausea   Morphine Sulfate Other (See Comments)    REACTION: Agitation   Propoxyphene Hcl Nausea And Vomiting   Tramadol Hcl Nausea And Vomiting   Anesthetics, Halogenated     Per patient's daughter, she can not tolerate anesthetics.    Etomidate     Heart stopped during anesthesia   Fentanyl     Heart stopped during anesthesia    Haloperidol     Other reaction(s): nausea & vomitin   Morphine Sulfate     Other reaction(s): nausea   Oxycodone-Acetaminophen     Other reaction(s): headaches   Propoxyphene     Other reaction(s): nausea & vomiting   Tramadol Hcl     Other reaction(s): nausea & vomitin Other reaction(s): nausea & vomitin   Versed [Midazolam]     Heart stopped during anesthesia    Patient Measurements: Height: 5' 4"  (162.6 cm) Weight: 59.1 kg (130 lb 4.7 oz) IBW/kg (Calculated) : 54.7  Vital Signs: Temp: 98.1 F (36.7 C) (08/31 0800) Temp Source: Oral (08/31 0800) BP: 127/60 (08/31 0800) Pulse Rate: 78 (08/31 0800)  Labs: Recent Labs    11/15/20 1330 11/15/20 1827 11/16/20 0038 11/16/20 0950  HGB 10.8*  --  9.7*  --   HCT 33.2*  --  29.4*  --   PLT 206  --  192  --   HEPARINUNFRC  --   --   --  >1.10*  CREATININE 2.58*  --  2.26*  --   TROPONINIHS 47* 55* 66*  --      Estimated Creatinine Clearance: 19.1 mL/min (A) (by C-G formula based on SCr of 2.26 mg/dL (H)).  Medical History: Past Medical History:  Diagnosis Date   Abdominal discomfort    Chronic N/V/D. Presumptive dx Crohn's dx per elevated p ANCA. Failed Entocort and Pentasa. Sep 2003 - ileocolectomy c  anastomosis per Dr Deon Pilling 2/2 adhesions - path was hegative for Crohns. EGD, Sm bowel follow through (11/03), and an eteroclysis (10/03) were unrevealing. Cuases hypomag and hypocalcemia.   Adnexal mass 8/03   s/p lap BSO (R ovarian fibroma) & lysis of adhesions   Allergy    Seasonal   Anemia    Multifactorial. Baseline HgB 10-11 ish. B12 def - 150 in 3/10. Fe Def - ferritin 35 3/10. Both are being repleted.   Breast cancer ([place]) 03/16/13   right, 5 o'clock   CAD (coronary artery disease) 1996   1996 - PTCA and angioplasty diagonal branch. 2000 - Rotoblator & angiopllasty of diagonal. 2006 - subendocardial AMI, DES to proximal LAD.Marland Kitchen Also had 90% stenosis in distal apical LAD. EF 55 with apical hypokinesis. Indefinite ASA and Plavix.   CHF (congestive heart failure) (HCC)    Chronic kidney disease    Chronic renal insuff baseline Cr 1.2 - 1.4 ish.   Chronic pain    CT 10/10 = Spinal stenosis L2 - S1.   Diabetes mellitus    Insulin dependent   Gout    Hx of radiation therapy 06/02/13- 07/16/13   right rbeast 4500 cGy 25 sessions, right breast boost 1600 cGy in 8 sessions   Hyperlipidemia    Managed with  both a statin and Welchol. Welchol stopped 2014 2/2 cost and started on fenofibrate    Hypertension    2006 B renal arteries patent. 2003 MRA - no RAS. 2003 pheo W/U Dr Hassell Done reportedly negative.   Hypoxia 07/23/2017   Lupus ([place])    Lymphedema of breast    Personal history of radiation therapy 2015   RBBB    Renal vein thrombosis (HCC)    SBO (small bowel obstruction) ([place]) 09/17/2017   Secondary hyperparathyroidism ([place])    Stroke [facility])    Incidental finding MRI 2002 L lacunar infarct   Vitamin B12 deficiency    Vitamin D deficiency    Wears dentures    top    Medications:  Medications Prior to Admission  Medication Sig Dispense Refill Last Dose   acetaminophen (TYLENOL) 500 MG tablet Take 1,000 mg by mouth every 6 (six) hours as needed for moderate pain or headache.       amLODipine (NORVASC) 10 MG tablet Take 1 tablet (10 mg total) by mouth daily. (Patient not taking: Reported on 11/02/2020) 30 tablet 0    apixaban (ELIQUIS) 5 MG TABS tablet Take 1 tablet (5 mg total) by mouth 2 (two) times daily. Start after you finish the starter pack (Patient taking differently: Take 5 mg by mouth 2 (two) times daily.) 60 tablet 2    budesonide (ENTOCORT EC) 3 MG 24 hr capsule Take 1 capsule by mouth once daily (Patient taking differently: Take 3 mg by mouth daily.) 30 capsule 0    Cyanocobalamin (B-12 COMPLIANCE INJECTION IJ) Inject 1 vial as directed every 30 (thirty) days.      Epoetin Alfa-epbx (RETACRIT IJ) Inject 1 vial as directed every 30 (thirty) days.      ergocalciferol (VITAMIN D2) 1.25 MG (50000 UT) capsule Take 1 capsule (50,000 Units total) by mouth once a week. 4 capsule 1    famotidine (PEPCID) 20 MG tablet Take 1 tablet (20 mg total) by mouth at bedtime. (Patient taking differently: Take 40 mg by mouth at bedtime.)      feeding supplement (ENSURE ENLIVE / ENSURE PLUS) LIQD Take 237 mLs by mouth 2 (two) times daily between meals.      FLUoxetine (PROZAC) 20 MG capsule Take 20 mg by mouth at bedtime.      furosemide (LASIX) 40 MG tablet Take 1 tablet (40 mg total) by mouth daily.      hydrALAZINE (APRESOLINE) 100 MG tablet TAKE 1 TABLET BY MOUTH 3  TIMES DAILY (Patient taking differently: Take 50 mg by mouth 2 (two) times daily.) 90 tablet 11    isosorbide mononitrate (IMDUR) 30 MG 24 hr tablet TAKE 1/2 (ONE-HALF) TABLET BY MOUTH ONCE DAILY (PLEASE  SCHEDULE  AN  APPOINTMENT  FOR  FURTHER  REFILLS) (Patient taking differently: Take 15 mg by mouth daily.) 45 tablet 0    linagliptin (TRADJENTA) 5 MG TABS tablet Take 5 mg by mouth daily.      lipase/protease/amylase (CREON) 36000 UNITS CPEP capsule Take 36,000 Units by mouth 3 (three) times daily with meals.      loperamide (IMODIUM A-D) 2 MG tablet Take 4 mg by mouth 4 (four) times daily as needed for diarrhea or loose  stools.      Melatonin 5 MG TABS Take 10 mg by mouth at bedtime as needed (sleep).      nebivolol (BYSTOLIC) 10 MG tablet Take 1 tablet (10 mg total) by mouth daily. 90 tablet 0    nitroGLYCERIN (NITROSTAT)  0.4 MG SL tablet DISSOLVE 1 TABLET UNDER THE TONGUE EVERY 5 MINUTES AS  NEEDED FOR CHEST PAIN. MAX  OF 3 TABLETS IN 15 MINUTES. CALL 911 IF PAIN PERSISTS. (Patient taking differently: Place 0.4 mg under the tongue every 5 (five) minutes as needed for chest pain.) 25 tablet 0    ondansetron (ZOFRAN ODT) 4 MG disintegrating tablet Take 1 tablet (4 mg total) by mouth every 8 (eight) hours as needed for nausea or vomiting. 12 tablet 0    pantoprazole (PROTONIX) 40 MG tablet TAKE 1 TABLET BY MOUTH  DAILY BEFORE BREAKFAST (Patient taking differently: Take 40 mg by mouth daily.) 90 tablet 3    potassium chloride SA (KLOR-CON) 20 MEQ tablet Take 20 mEq by mouth daily.      sucralfate (CARAFATE) 1 g tablet Take 1 g by mouth 4 (four) times daily -  with meals and at bedtime.       Assessment: 75 y.o. female with SOB and elevated D-Dimer, possible PE, for heparin. Talked with patient and family member - last time took [place] was in June.   Initial heparin level came back undetectably high (>1.1), on 950 units/hr. Hgb 9.7, plt 192. No evidence DVT on duplex. VQ scan showing no evidence of PE.  Goal of Therapy:  Heparin level 0.3-0.7 units/ml Monitor platelets by anticoagulation protocol: Yes   Plan:  Hold heparin for 1 hr Reduce heparin infusion to 850 units/hr upon restart F/u with MD stopping heparin infusion given negative scans for VTE  Antonietta Jewel, PharmD, BCCCP Clinical Pharmacist  Phone: [phone] 11/16/2020 11:48 AM  Please check AMION for all [place] phone numbers After 10:00 PM, call [place] [phone]  ADDENDUM Stopping heparin infusion given no VTE on scans. Discussed with MD.  Antonietta Jewel, PharmD, [place] Clinical Pharmacist

## 2020-11-16 NOTE — Progress Notes (Signed)
Pt started on IV heparin, Bolus of 4,000 given and then started on 9.4m hr. NJudith Blonder08/31/22. 2:46 AM

## 2020-11-16 NOTE — Progress Notes (Signed)
Initial Nutrition Assessment  DOCUMENTATION CODES:   Not applicable  INTERVENTION:  48 hour Calorie count initiated.  Provide Boost Breeze po TID, each supplement provides 250 kcal and 9 grams of protein  Provide Magic cup TID with meals, each supplement provides 290 kcal and 9 grams of protein   Encourage adequate PO intake.   NUTRITION DIAGNOSIS:   Inadequate oral intake related to poor appetite as evidenced by meal completion < 25%.  GOAL:   Patient will meet greater than or equal to 90% of their needs  MONITOR:   PO intake, Supplement acceptance, Labs, Weight trends, Skin, I & O's  REASON FOR ASSESSMENT:   Consult Assessment of nutrition requirement/status, Poor PO, Calorie Count  ASSESSMENT:   75 year old female who presented to the ED on 8/30 with SOB. Pt with recent diagnosis of COVID-19. PMH of CAD, CHF, breast cancer, CKD stage IV, gout, HTN, T2DM, GERD. GAD.  Meal Completion: 10% x 1 documented meal    Pt reports dislike of hospital food thus far on meal trays. Pt reports usual consumption of 2-3 meals a day at home. Pt unable to provide further information of usual foods eaten. Per MD note, family reports poor po intake for several months. Pt with a 28% weight loss in 4 months per weight records. Pt currently has Ensure ordered however has been refusing them. Pt reports Ensure causes abdominal discomfort. RD to order Boost Breeze instead. Pt did report interest in trying magic cup.   Medications reviewed and include: Ensure Enlive BID, IV lasix, SSI, Creon TID, protonix, sucralfate, heparin drip     Labs reviewed: potassium 2.9, BUN 26, creatinine 2.26, magnesium 1.1, hemoglobin 9.7 CBG's: 109-153 x 24 hours  NUTRITION - FOCUSED PHYSICAL EXAM:  Flowsheet Row Most Recent Value  Orbital Region Unable to assess  Upper Arm Region No depletion  Thoracic and Lumbar Region No depletion  Buccal Region Unable to assess  Temple Region Unable to assess  Clavicle  Bone Region Mild depletion  Clavicle and Acromion Bone Region Mild depletion  Scapular Bone Region Unable to assess  Dorsal Hand Unable to assess  Patellar Region No depletion  Anterior Thigh Region No depletion  Posterior Calf Region No depletion  Edema (RD Assessment) None  Hair Reviewed  Eyes Reviewed  Mouth Reviewed  Skin Reviewed  Nails Reviewed      Diet Order:   Diet Order             Diet regular Room service appropriate? Yes; Fluid consistency: Thin  Diet effective now                   EDUCATION NEEDS:   Not appropriate for education at this time  Skin:  Skin Assessment: Reviewed RN Assessment  Last BM:  no documented BM  Height:   Ht Readings from Last 1 Encounters:  11/15/20 5' 4"  (1.626 m)    Weight:   Wt Readings from Last 1 Encounters:  11/15/20 59.1 kg   BMI:  Body mass index is 22.36 kg/m.  Estimated Nutritional Needs:   Kcal:  1650-1850  Protein:  75-85 grams  Fluid:  >/= 1.6 L/day  Corrin Parker, MS, RD, LDN RD pager number/after hours weekend pager number on Amion.

## 2020-11-16 NOTE — Progress Notes (Signed)
ANTICOAGULATION CONSULT NOTE - Initial Consult  Pharmacy Consult for Heparin Indication: possible pulmonary embolism  Allergies  Allergen Reactions   Percocet [Oxycodone-Acetaminophen] Other (See Comments)    headaches   Diazepam Other (See Comments)    REACTION: Agitation Other reaction(s): agitation   Haloperidol Lactate Nausea And Vomiting   Lorazepam Nausea Only    Other reaction(s): nausea   Morphine Sulfate Other (See Comments)    REACTION: Agitation   Propoxyphene Hcl Nausea And Vomiting   Tramadol Hcl Nausea And Vomiting   Anesthetics, Halogenated     Per patient's daughter, she can not tolerate anesthetics.    Etomidate     Heart stopped during anesthesia   Fentanyl     Heart stopped during anesthesia    Haloperidol     Other reaction(s): nausea & vomitin   Morphine Sulfate     Other reaction(s): nausea   Oxycodone-Acetaminophen     Other reaction(s): headaches   Propoxyphene     Other reaction(s): nausea & vomiting   Tramadol Hcl     Other reaction(s): nausea & vomitin Other reaction(s): nausea & vomitin   Versed [Midazolam]     Heart stopped during anesthesia    Patient Measurements: Height: 5' 4"  (162.6 cm) Weight: 59.1 kg (130 lb 4.7 oz) IBW/kg (Calculated) : 54.7  Vital Signs: Temp: 98.4 F (36.9 C) (08/30 2300) Temp Source: Oral (08/30 2300) BP: 143/76 (08/30 2300) Pulse Rate: 83 (08/30 2300)  Labs: Recent Labs    11/15/20 1330 11/15/20 1827 11/16/20 0038  HGB 10.8*  --  9.7*  HCT 33.2*  --  29.4*  PLT 206  --  192  CREATININE 2.58*  --   --   TROPONINIHS 47* 55*  --     Estimated Creatinine Clearance: 16.8 mL/min (A) (by C-G formula based on SCr of 2.58 mg/dL (H)).  Medical History: Past Medical History:  Diagnosis Date   Abdominal discomfort    Chronic N/V/D. Presumptive dx Crohn's dx per elevated p ANCA. Failed Entocort and Pentasa. Sep 2003 - ileocolectomy c anastomosis per Dr Deon Pilling 2/2 adhesions - path was hegative for  Crohns. EGD, Sm bowel follow through (11/03), and an eteroclysis (10/03) were unrevealing. Cuases hypomag and hypocalcemia.   Adnexal mass 8/03   s/p lap BSO (R ovarian fibroma) & lysis of adhesions   Allergy    Seasonal   Anemia    Multifactorial. Baseline HgB 10-11 ish. B12 def - 150 in 3/10. Fe Def - ferritin 35 3/10. Both are being repleted.   Breast cancer ([place]) 03/16/13   right, 5 o'clock   CAD (coronary artery disease) 1996   1996 - PTCA and angioplasty diagonal branch. 2000 - Rotoblator & angiopllasty of diagonal. 2006 - subendocardial AMI, DES to proximal LAD.Marland Kitchen Also had 90% stenosis in distal apical LAD. EF 55 with apical hypokinesis. Indefinite ASA and Plavix.   CHF (congestive heart failure) (HCC)    Chronic kidney disease    Chronic renal insuff baseline Cr 1.2 - 1.4 ish.   Chronic pain    CT 10/10 = Spinal stenosis L2 - S1.   Diabetes mellitus    Insulin dependent   Gout    Hx of radiation therapy 06/02/13- 07/16/13   right rbeast 4500 cGy 25 sessions, right breast boost 1600 cGy in 8 sessions   Hyperlipidemia    Managed with both a statin and Welchol. Welchol stopped 2014 2/2 cost and started on fenofibrate    Hypertension    2006 B  renal arteries patent. 2003 MRA - no RAS. 2003 pheo W/U Dr Hassell Done reportedly negative.   Hypoxia 07/23/2017   Lupus ([place])    Lymphedema of breast    Personal history of radiation therapy 2015   RBBB    Renal vein thrombosis (HCC)    SBO (small bowel obstruction) ([place]) 09/17/2017   Secondary hyperparathyroidism ([place])    Stroke [facility])    Incidental finding MRI 2002 L lacunar infarct   Vitamin B12 deficiency    Vitamin D deficiency    Wears dentures    top    Medications:  Medications Prior to Admission  Medication Sig Dispense Refill Last Dose   acetaminophen (TYLENOL) 500 MG tablet Take 1,000 mg by mouth every 6 (six) hours as needed for moderate pain or headache.      amLODipine (NORVASC) 10 MG tablet Take 1 tablet (10 mg total) by  mouth daily. (Patient not taking: Reported on 11/02/2020) 30 tablet 0    apixaban (ELIQUIS) 5 MG TABS tablet Take 1 tablet (5 mg total) by mouth 2 (two) times daily. Start after you finish the starter pack (Patient taking differently: Take 5 mg by mouth 2 (two) times daily.) 60 tablet 2    budesonide (ENTOCORT EC) 3 MG 24 hr capsule Take 1 capsule by mouth once daily (Patient taking differently: Take 3 mg by mouth daily.) 30 capsule 0    Cyanocobalamin (B-12 COMPLIANCE INJECTION IJ) Inject 1 vial as directed every 30 (thirty) days.      Epoetin Alfa-epbx (RETACRIT IJ) Inject 1 vial as directed every 30 (thirty) days.      ergocalciferol (VITAMIN D2) 1.25 MG (50000 UT) capsule Take 1 capsule (50,000 Units total) by mouth once a week. 4 capsule 1    famotidine (PEPCID) 20 MG tablet Take 1 tablet (20 mg total) by mouth at bedtime. (Patient taking differently: Take 40 mg by mouth at bedtime.)      feeding supplement (ENSURE ENLIVE / ENSURE PLUS) LIQD Take 237 mLs by mouth 2 (two) times daily between meals.      FLUoxetine (PROZAC) 20 MG capsule Take 20 mg by mouth at bedtime.      furosemide (LASIX) 40 MG tablet Take 1 tablet (40 mg total) by mouth daily.      hydrALAZINE (APRESOLINE) 100 MG tablet TAKE 1 TABLET BY MOUTH 3  TIMES DAILY (Patient taking differently: Take 50 mg by mouth 2 (two) times daily.) 90 tablet 11    isosorbide mononitrate (IMDUR) 30 MG 24 hr tablet TAKE 1/2 (ONE-HALF) TABLET BY MOUTH ONCE DAILY (PLEASE  SCHEDULE  AN  APPOINTMENT  FOR  FURTHER  REFILLS) (Patient taking differently: Take 15 mg by mouth daily.) 45 tablet 0    linagliptin (TRADJENTA) 5 MG TABS tablet Take 5 mg by mouth daily.      lipase/protease/amylase (CREON) 36000 UNITS CPEP capsule Take 36,000 Units by mouth 3 (three) times daily with meals.      loperamide (IMODIUM A-D) 2 MG tablet Take 4 mg by mouth 4 (four) times daily as needed for diarrhea or loose stools.      Melatonin 5 MG TABS Take 10 mg by mouth at bedtime  as needed (sleep).      nebivolol (BYSTOLIC) 10 MG tablet Take 1 tablet (10 mg total) by mouth daily. 90 tablet 0    nitroGLYCERIN (NITROSTAT) 0.4 MG SL tablet DISSOLVE 1 TABLET UNDER THE TONGUE EVERY 5 MINUTES AS  NEEDED FOR CHEST PAIN. MAX  OF 3  TABLETS IN 15 MINUTES. CALL 911 IF PAIN PERSISTS. (Patient taking differently: Place 0.4 mg under the tongue every 5 (five) minutes as needed for chest pain.) 25 tablet 0    ondansetron (ZOFRAN ODT) 4 MG disintegrating tablet Take 1 tablet (4 mg total) by mouth every 8 (eight) hours as needed for nausea or vomiting. 12 tablet 0    pantoprazole (PROTONIX) 40 MG tablet TAKE 1 TABLET BY MOUTH  DAILY BEFORE BREAKFAST (Patient taking differently: Take 40 mg by mouth daily.) 90 tablet 3    potassium chloride SA (KLOR-CON) 20 MEQ tablet Take 20 mEq by mouth daily.      sucralfate (CARAFATE) 1 g tablet Take 1 g by mouth 4 (four) times daily -  with meals and at bedtime.       Assessment: 75 y.o. female with SOB and elevated D-Dimer, possible PE, for heparin Goal of Therapy:  Heparin level 0.3-0.7 units/ml Monitor platelets by anticoagulation protocol: Yes   Plan:  Heparin 4000 units IV bolus, then start heparin 950 units/hr Check heparin level in 8 hours.   Caryl Pina 11/16/2020,1:39 AM

## 2020-11-16 NOTE — Progress Notes (Signed)
Rn entered room to find family and staff member assessing pt,  RN was then told by family of concern of facial droop.  NP "unknown at this time whom" assessing pt was notified by family not by RN or current treatment team.  There is no documentation of the assessment.  RN notified MD  Ghimire of concern for facial droop by family.  MD acknowledged event , no new orders at this time.

## 2020-11-16 NOTE — Progress Notes (Signed)
Lower extremity venous has been completed.   Preliminary results in CV Proc.   [name] 11/16/2020 10:43 AM

## 2020-11-17 DIAGNOSIS — Z833 Family history of diabetes mellitus: Secondary | ICD-10-CM | POA: Diagnosis not present

## 2020-11-17 DIAGNOSIS — E46 Unspecified protein-calorie malnutrition: Secondary | ICD-10-CM | POA: Diagnosis not present

## 2020-11-17 DIAGNOSIS — Z86718 Personal history of other venous thrombosis and embolism: Secondary | ICD-10-CM | POA: Diagnosis not present

## 2020-11-17 DIAGNOSIS — Z853 Personal history of malignant neoplasm of breast: Secondary | ICD-10-CM | POA: Diagnosis not present

## 2020-11-17 DIAGNOSIS — Z20822 Contact with and (suspected) exposure to covid-19: Secondary | ICD-10-CM | POA: Diagnosis not present

## 2020-11-17 DIAGNOSIS — E1122 Type 2 diabetes mellitus with diabetic chronic kidney disease: Secondary | ICD-10-CM | POA: Diagnosis present

## 2020-11-17 DIAGNOSIS — I951 Orthostatic hypotension: Secondary | ICD-10-CM | POA: Diagnosis present

## 2020-11-17 DIAGNOSIS — E785 Hyperlipidemia, unspecified: Secondary | ICD-10-CM | POA: Diagnosis present

## 2020-11-17 DIAGNOSIS — F32A Depression, unspecified: Secondary | ICD-10-CM | POA: Diagnosis present

## 2020-11-17 DIAGNOSIS — E1169 Type 2 diabetes mellitus with other specified complication: Secondary | ICD-10-CM | POA: Diagnosis present

## 2020-11-17 DIAGNOSIS — N184 Chronic kidney disease, stage 4 (severe): Secondary | ICD-10-CM | POA: Diagnosis present

## 2020-11-17 DIAGNOSIS — E1140 Type 2 diabetes mellitus with diabetic neuropathy, unspecified: Secondary | ICD-10-CM | POA: Diagnosis not present

## 2020-11-17 DIAGNOSIS — Z8616 Personal history of COVID-19: Secondary | ICD-10-CM | POA: Diagnosis not present

## 2020-11-17 DIAGNOSIS — E876 Hypokalemia: Secondary | ICD-10-CM | POA: Diagnosis not present

## 2020-11-17 DIAGNOSIS — I5032 Chronic diastolic (congestive) heart failure: Secondary | ICD-10-CM | POA: Diagnosis present

## 2020-11-17 DIAGNOSIS — E538 Deficiency of other specified B group vitamins: Secondary | ICD-10-CM | POA: Diagnosis present

## 2020-11-17 DIAGNOSIS — R0602 Shortness of breath: Secondary | ICD-10-CM | POA: Diagnosis not present

## 2020-11-17 DIAGNOSIS — R634 Abnormal weight loss: Secondary | ICD-10-CM | POA: Diagnosis not present

## 2020-11-17 DIAGNOSIS — I130 Hypertensive heart and chronic kidney disease with heart failure and stage 1 through stage 4 chronic kidney disease, or unspecified chronic kidney disease: Secondary | ICD-10-CM | POA: Diagnosis not present

## 2020-11-17 DIAGNOSIS — Z9049 Acquired absence of other specified parts of digestive tract: Secondary | ICD-10-CM | POA: Diagnosis not present

## 2020-11-17 DIAGNOSIS — D509 Iron deficiency anemia, unspecified: Secondary | ICD-10-CM | POA: Diagnosis not present

## 2020-11-17 DIAGNOSIS — F411 Generalized anxiety disorder: Secondary | ICD-10-CM | POA: Diagnosis present

## 2020-11-17 DIAGNOSIS — D631 Anemia in chronic kidney disease: Secondary | ICD-10-CM | POA: Diagnosis not present

## 2020-11-17 DIAGNOSIS — R627 Adult failure to thrive: Secondary | ICD-10-CM | POA: Diagnosis not present

## 2020-11-17 DIAGNOSIS — I2510 Atherosclerotic heart disease of native coronary artery without angina pectoris: Secondary | ICD-10-CM | POA: Diagnosis present

## 2020-11-17 LAB — GLUCOSE, CAPILLARY
Glucose-Capillary: 94 mg/dL (ref 70–99)
Glucose-Capillary: 177 mg/dL — ABNORMAL HIGH (ref 70–99)
Glucose-Capillary: 118 mg/dL — ABNORMAL HIGH (ref 70–99)
Glucose-Capillary: 123 mg/dL — ABNORMAL HIGH (ref 70–99)

## 2020-11-17 LAB — CBC
HCT: 28.7 % — ABNORMAL LOW (ref 36.0–46.0)
Hemoglobin: 9.1 g/dL — ABNORMAL LOW (ref 12.0–15.0)
MCH: 25.9 pg — ABNORMAL LOW (ref 26.0–34.0)
MCHC: 31.7 g/dL (ref 30.0–36.0)
MCV: 81.5 fL (ref 80.0–100.0)
Platelets: 185 10*3/uL (ref 150–400)
RBC: 3.52 MIL/uL — ABNORMAL LOW (ref 3.87–5.11)
RDW: 17 % — ABNORMAL HIGH (ref 11.5–15.5)
WBC: 7.9 10*3/uL (ref 4.0–10.5)
nRBC: 0 % (ref 0.0–0.2)

## 2020-11-17 LAB — BASIC METABOLIC PANEL
Anion gap: 11 (ref 5–15)
BUN: 23 mg/dL (ref 8–23)
CO2: 17 mmol/L — ABNORMAL LOW (ref 22–32)
Calcium: 8.8 mg/dL — ABNORMAL LOW (ref 8.9–10.3)
Chloride: 111 mmol/L (ref 98–111)
Creatinine, Ser: 2.19 mg/dL — ABNORMAL HIGH (ref 0.44–1.00)
GFR, Estimated: 23 mL/min — ABNORMAL LOW (ref >60)
Glucose, Bld: 90 mg/dL (ref 70–99)
Potassium: 3.4 mmol/L — ABNORMAL LOW (ref 3.5–5.1)
Sodium: 139 mmol/L (ref 135–145)

## 2020-11-17 LAB — MAGNESIUM: Magnesium: 2.3 mg/dL (ref 1.7–2.4)

## 2020-11-17 MED ORDER — CYANOCOBALAMIN 1000 MCG/ML IJ SOLN
1000.0000 ug | Freq: Once | INTRAMUSCULAR | Status: AC
  Administered 2020-11-17: 1000 ug via SUBCUTANEOUS
  Filled 2020-11-17: qty 1

## 2020-11-17 MED ORDER — ONDANSETRON HCL 4 MG/2ML IJ SOLN
4.0000 mg | Freq: Three times a day (TID) | INTRAMUSCULAR | Status: DC
  Administered 2020-11-17 – 2020-11-19 (×8): 4 mg via INTRAVENOUS
  Filled 2020-11-17 (×8): qty 2

## 2020-11-17 MED ORDER — DARBEPOETIN ALFA 300 MCG/0.6ML IJ SOSY
300.0000 ug | PREFILLED_SYRINGE | Freq: Once | INTRAMUSCULAR | Status: AC
  Administered 2020-11-17: 300 ug via SUBCUTANEOUS
  Filled 2020-11-17: qty 0.6

## 2020-11-17 MED ORDER — APIXABAN 5 MG PO TABS
5.0000 mg | ORAL_TABLET | Freq: Two times a day (BID) | ORAL | Status: DC
  Administered 2020-11-18 – 2020-11-19 (×3): 5 mg via ORAL
  Filled 2020-11-17 (×3): qty 1

## 2020-11-17 MED ORDER — POTASSIUM CHLORIDE CRYS ER 20 MEQ PO TBCR
40.0000 meq | EXTENDED_RELEASE_TABLET | Freq: Every day | ORAL | Status: DC
  Administered 2020-11-17 – 2020-11-19 (×3): 40 meq via ORAL
  Filled 2020-11-17 (×3): qty 2

## 2020-11-17 MED ORDER — LOPERAMIDE HCL 2 MG PO CAPS
2.0000 mg | ORAL_CAPSULE | ORAL | Status: DC | PRN
  Administered 2020-11-17 – 2020-11-18 (×2): 2 mg via ORAL
  Filled 2020-11-17 (×2): qty 1

## 2020-11-17 MED ORDER — FUROSEMIDE 10 MG/ML IJ SOLN
80.0000 mg | Freq: Two times a day (BID) | INTRAMUSCULAR | Status: DC
  Administered 2020-11-17 (×2): 80 mg via INTRAVENOUS
  Filled 2020-11-17: qty 8

## 2020-11-17 MED ORDER — FUROSEMIDE 10 MG/ML IJ SOLN: 80.0000 mg | Freq: Two times a day (BID) | INTRAMUSCULAR | Status: DC

## 2020-11-17 MED ORDER — FUROSEMIDE 10 MG/ML IJ SOLN
INTRAMUSCULAR | Status: AC
  Filled 2020-11-17: qty 2

## 2020-11-17 NOTE — Progress Notes (Signed)
Mobility Specialist: Progress Note   11/17/20 1526  Mobility  Activity Ambulated in room;Ambulated in hall  Level of Assistance Contact guard assist, steadying assist  Assistive Device None  Distance Ambulated (ft) 132 ft (60'+72')  Mobility Ambulated with assistance in hallway  Mobility Response Tolerated well  Mobility performed by Mobility specialist  Bed Position Chair  $Mobility charge 1 Mobility   Pre-Mobility: 81 HR, 140/98 BP, 100% SpO2 During-Mobility: 135/73 BP Post-Mobility: 83 HR, 129/100 BP, 98% SpO2  Pt began ambulation in the room, asx throughout. Pt stopped for one short seated break d/t fatigue. Pt agreeable to ambulate in the hallway after seated break. Pt a little unsteady in hallway requiring contact guard. Pt to recliner after walk with call bell and phone at her side.   [facility] Jaidin Ugarte Mobility Specialist Mobility Specialist Phone: [phone]

## 2020-11-17 NOTE — Plan of Care (Signed)
  Problem: Health Behavior/Discharge Planning: Goal: Ability to manage health-related needs will improve Outcome: Not Progressing   Problem: Nutrition: Goal: Adequate nutrition will be maintained Outcome: Not Progressing

## 2020-11-17 NOTE — Progress Notes (Addendum)
HEMATOLOGY-ONCOLOGY PROGRESS NOTE  SUBJECTIVE: [name] is a 75 year old female who is followed by our office for anemia of chronic disease, iron deficiency anemia, and vitamin B12 deficiency.  She also has a history of breast cancer and history of left renal vein thrombosis and right lower extremity DVT.  Now admitted with shortness of breath.  She had a VQ scan and Doppler ultrasound with no evidence of VTE.  Overall, she states that she is feeling weak.  She is not eating very well.  She is not really getting out of bed other than to walk to the bathroom back to bed.  She gets very tired doing so.  She does not complain of any chest pain at this time.  No bleeding reported.  Oncology History Overview Note  Cancer Staging Breast cancer of lower-inner quadrant of right female breast [facility]) Staging form: Breast, AJCC 7th Edition - Clinical: Stage IA (T1b, N0, cM0) - Unsigned Staging comments: Staged at breast conference 03/25/13.  - Pathologic stage from 05/12/2013: Stage IA (T1b, N0, cM0) - Signed by Katie Merle, MD on 06/21/2016     Breast cancer of lower-inner quadrant of right female breast ([place])  02/27/2013 Mammogram   Diagnostic mammogram and ultrasound showed a 6 x 5 mm mass at 5:00 position. Axilla was negative by ultrasound.   03/16/2013 Initial Biopsy   Right breast 5:00 position biopsy showed invasive ductal carcinoma, low-grade   03/16/2013 Receptors her2   ER100% positive, PR 100% positive, HER-2 negative, Ki-67 20%   03/18/2013 Initial Diagnosis   Breast cancer of lower-inner quadrant of right female breast ([place])   05/12/2013 Surgery   Right breast lumpectomy with sentinel lymph node biopsy   05/12/2013 Pathology Results   Right breast lumpectomy showed invasive ductal carcinoma, grade 2, 0.9 cm, 6 sentinel lymph nodes were all negative. Margins were negative.   05/12/2013 Oncotype testing   Oncotype recurrence score 19, intermediate risk, 10 year risk of distant recurrence  12% with tamoxifen for 5 years. Patient declined adjuvant chemotherapy.   06/02/2013 - 07/16/2013 Radiation Therapy   Adjuvant breast radiation with boost 1600Gy   08/06/2013 - 07/2017 Anti-estrogen oral therapy   Arimidex 1 mg daily. Stopped in 07/2017 due to acute illness    03/22/2017 Mammogram   IMPRESSION: No mammographic evidence for malignancy.   Iron deficiency anemia     REVIEW OF SYSTEMS:   Constitutional: Reports weakness, fatigue, anorexia, weight loss  Eyes: Denies blurriness of vision Ears, nose, mouth, throat, and face: Denies mucositis or sore throat Respiratory: Reports shortness of breath Cardiovascular: Denies palpitation, chest discomfort Gastrointestinal:  Denies nausea, heartburn or change in bowel habits Skin: Denies abnormal skin rashes Lymphatics: Denies new lymphadenopathy or easy bruising Neurological:Denies numbness, tingling or new weaknesses Behavioral/Psych: Mood is stable, no new changes  All other systems were reviewed with the patient and are negative.  I have reviewed the past medical history, past surgical history, social history and family history with the patient and they are unchanged from previous note.   PHYSICAL EXAMINATION:  Vitals:   11/17/20 0615 11/17/20 0805  BP: 140/64 130/69  Pulse: 77 85  Resp: 12 16  Temp:  98.4 F (36.9 C)  SpO2: 99% 98%   Filed Weights   11/15/20 1621 11/15/20 2151  Weight: 65.2 kg 59.1 kg    Intake/Output from previous day: No intake/output data recorded.  GENERAL: Alert, no distress SKIN: skin color, texture, turgor are normal, no rashes or significant lesions EYES:  normal, Conjunctiva are pink and non-injected, sclera clear LUNGS: clear to auscultation and percussion with normal breathing effort HEART: regular rate & rhythm and no murmurs  ABDOMEN:abdomen soft, non-tender and normal bowel sounds NEURO: alert & oriented x 3 with fluent speech, no focal motor/sensory deficits  LABORATORY DATA:   I have reviewed the data as listed CMP Latest Ref Rng & Units 11/17/2020 11/16/2020 11/15/2020  Glucose 70 - 99 mg/dL 90 92 124(H)  BUN 8 - 23 mg/dL 23 26(H) 30(H)  Creatinine 0.44 - 1.00 mg/dL 2.19(H) 2.26(H) 2.58(H)  Sodium 135 - 145 mmol/L 139 140 139  Potassium 3.5 - 5.1 mmol/L 3.4(L) 2.9(L) 3.4(L)  Chloride 98 - 111 mmol/L 111 113(H) 108  CO2 22 - 32 mmol/L 17(L) 18(L) 18(L)  Calcium 8.9 - 10.3 mg/dL 8.8(L) 8.8(L) 9.1  Total Protein 6.5 - 8.1 g/dL - 6.1(L) 6.9  Total Bilirubin 0.3 - 1.2 mg/dL - 0.7 0.7  Alkaline Phos 38 - 126 U/L - 71 76  AST 15 - 41 U/L - 33 55(H)  ALT 0 - 44 U/L - 35 44    Lab Results  Component Value Date   WBC 7.9 11/17/2020   HGB 9.1 (L) 11/17/2020   HCT 28.7 (L) 11/17/2020   MCV 81.5 11/17/2020   PLT 185 11/17/2020   NEUTROABS 6.2 11/16/2020    DG Chest 2 View  Result Date: 11/15/2020 CLINICAL DATA:  weakness EXAM: CHEST - 2 VIEW COMPARISON:  None. FINDINGS: Vascular stent overlying the heart. The cardiomediastinal silhouette is within normal limits. No pleural effusion. No pneumothorax. No mass or consolidation. No acute osseous abnormality. Right axillary surgical clips. IMPRESSION: No acute abnormality in the chest. Electronically Signed   By: Albin Felling M.D.   On: 11/15/2020 14:38   NM Pulmonary Perfusion  Result Date: 11/16/2020 CLINICAL DATA:  Nonspecific chest pain EXAM: NUCLEAR MEDICINE PERFUSION LUNG SCAN TECHNIQUE: Perfusion images were obtained in multiple projections after intravenous injection of radiopharmaceutical. Ventilation scans intentionally deferred if perfusion scan and chest x-ray adequate for interpretation during COVID 19 epidemic. RADIOPHARMACEUTICALS:  4.3 mCi Tc-25mMAA IV COMPARISON:  06/29/2020 FINDINGS: Slight irregularity of perfusion in the peripheral aspect of the RIGHT lung unchanged, in a nonsegmental pattern. No segmental or subsegmental perfusion defects identified. IMPRESSION: No evidence of pulmonary embolism.  Stable exam versus 06/29/2020 Electronically Signed   By: MLavonia DanaM.D.   On: 11/16/2020 11:33   DG Chest Portable 1 View  Result Date: 11/01/2020 CLINICAL DATA:  Weakness EXAM: PORTABLE CHEST 1 VIEW COMPARISON:  07/20/2020 FINDINGS: Lungs are clear.  No pleural effusion or pneumothorax. The heart is normal in size. Surgical clips along the right lateral chest wall/axilla. Surgical clips in the right upper abdomen. IMPRESSION: No evidence of acute cardiopulmonary disease. Electronically Signed   By: SJulian HyM.D.   On: 11/01/2020 21:02   VAS [place]OWER EXTREMITY VENOUS (DVT)  Result Date: 11/16/2020  Lower Venous DVT Study Patient Name:  [name]  Date of Exam:   11/16/2020 Medical Rec #: [id number]          Accession #:    [id number] of Birth: 409-Apr-1947          Patient Gender: F Patient Age:   89years [facility]:      VAS [place]OWER EXTREMITY VENOUS (DVT) Referring Phys: GInda Merlin--------------------------------------------------------------------------------  Indications: Edema.  Comparison Study: 06/29/20 prior Performing Technologist: MArchie PattenRVS  Examination  Guidelines: A complete evaluation includes B-mode imaging, spectral Doppler, color Doppler, and power Doppler as needed of all accessible portions of each vessel. Bilateral testing is considered an integral part of a complete examination. Limited examinations for reoccurring indications may be performed as noted. The reflux portion of the exam is performed with the patient in reverse Trendelenburg.  +---------+---------------+---------+-----------+----------+-------------------+ RIGHT    CompressibilityPhasicitySpontaneityPropertiesThrombus Aging      +---------+---------------+---------+-----------+----------+-------------------+ CFV      Full           Yes      Yes                                       +---------+---------------+---------+-----------+----------+-------------------+ SFJ      Full                                                             +---------+---------------+---------+-----------+----------+-------------------+ FV Prox  Full                                                             +---------+---------------+---------+-----------+----------+-------------------+ FV Mid   Full                                                             +---------+---------------+---------+-----------+----------+-------------------+ FV DistalFull                                                             +---------+---------------+---------+-----------+----------+-------------------+ PFV      Full                                                             +---------+---------------+---------+-----------+----------+-------------------+ POP      Full           Yes      Yes                                      +---------+---------------+---------+-----------+----------+-------------------+ PTV      Full                                                             +---------+---------------+---------+-----------+----------+-------------------+ PERO  Not well visualized +---------+---------------+---------+-----------+----------+-------------------+   +---------+---------------+---------+-----------+----------+-------------------+ LEFT     CompressibilityPhasicitySpontaneityPropertiesThrombus Aging      +---------+---------------+---------+-----------+----------+-------------------+ CFV      Full           Yes      Yes                                      +---------+---------------+---------+-----------+----------+-------------------+ SFJ      Full                                                             +---------+---------------+---------+-----------+----------+-------------------+ FV  Prox  Full                                                             +---------+---------------+---------+-----------+----------+-------------------+ FV Mid   Full                                                             +---------+---------------+---------+-----------+----------+-------------------+ FV DistalFull                                                             +---------+---------------+---------+-----------+----------+-------------------+ PFV      Full                                                             +---------+---------------+---------+-----------+----------+-------------------+ POP      Full           Yes      Yes                                      +---------+---------------+---------+-----------+----------+-------------------+ PTV      Full                                                             +---------+---------------+---------+-----------+----------+-------------------+ PERO                                                  Not well visualized +---------+---------------+---------+-----------+----------+-------------------+  Summary: BILATERAL: - No evidence of deep vein thrombosis seen in the lower extremities, bilaterally. -No evidence of popliteal cyst, bilaterally.   *See table(s) above for measurements and observations. Electronically signed by Harold Barban MD on 11/16/2020 at 6:29:59 PM.    Final     ASSESSMENT AND PLAN: 1.  Anemia due to chronic disease, iron deficiency, vitamin B12 deficiency 2.  History of left renal vein thrombosis and right lower extremity DVT, elevated D-dimer 3.  HFpEF with exacerbation 4.  CKD stage IV 5.  Diabetes mellitus 6.  Hypertension 7.  History of breast cancer 8.  History of anxiety/depression 9.  History of?  Crohn's disease status post colectomy 10.  Protein calorie malnutrition  -The patient is due for her vitamin B12 injection as well as her EPO.  Will administer  vitamin B12 today as well as Aranesp.  She is scheduled for follow-up in our office in 2 weeks and we will recheck her labs in our office and her next dose of EPO at that time. -Had an extensive discussion today with the patient and her family member at the bedside regarding her history of VTE.  We discussed the risk/benefits of anticoagulation.  Given history for recurrent clotting, she is at risk for developing clots again in the future.  We discussed the risks of bleeding with anticoagulation.  The patient and family are willing to accept these risks.  Therefore, will place her on a renal dose of Eliquis at 5 mg twice daily with plans to decrease to 2.5 mg twice daily in a week -The patient is considering feeding tube placement.  Would also recommend consideration of appetite stimulant.  We will also trial her on scheduled Zofran to treat possible underlying nausea. -Management of other chronic medical conditions per hospitalist.   LOS: 0 days   Mikey Bussing, DNP, AGPCNP-BC, AOCNP 11/17/20   Addendum  I have seen the patient with NP Erasmo Downer. I agree with the assessment and and plan and have edited the notes.   Her anemia is overall stable, will give B12 and Aranesp injection today which are due for her.   I am not sure what the exact cause of her generalized weakness and anorexia, but suspect this is probably multifactorial, secondary to her CHF, acute on chronic renal failure and other medical comorbilities . Family also questions about occlude thrombosis.  Due to her 2 episodes of deep vein thrombosis, I think is very reasonable to continue anticoagulation indefinitely.  However she is elderly, had recent fall, orthostatic hypotension, she is at risk for bleeding also.  Her chronic renal failure also makes anticoagulation challenge.  After lengthy discussion, patient and her family agreed to restart Eliquis, which she previous tolerated well. Due to her CKD and low body weight (<60Kg), will  adjust adequate dose to 5 mg twice daily for 1 week, then changed to 2.5 mg twice daily.   I will f/u after her discharge. She is scheduled with [place] in 2 weeks.  [name]  11/17/2020

## 2020-11-17 NOTE — Progress Notes (Signed)
Calorie Count Note  48 hour calorie count ordered.  Diet: regular Supplements: Boost Breeze po TID, each supplement provides 250 kcal and 9 grams of protein; Magic cup TID with meals, each supplement provides 290 kcal and 9 grams of protein   Spoke with pt and daughters at bedside. They reports poor appetite over the past 3 weeks and reports pt has become weaker and more confused. Pt eating very little despite encouragement. Family has been bringing favorite foods, but only consumes bites. Noted lunch tray and breakfast trays mainly unattempted.   RD broached possibility of enteral nutrition support with family. Family desires this, however, pt currently refusing at this time. RD encouraged pt to think about possible cortrak tube placement. Per family, pt has history of bowel obstructions and has had poor experience with NGT placement in the past; RD explained that cortrak tube is smaller and more pliable, so would be more comfortable. She has received TF in the past and historically did not tolerate Jevity 1.5 formula well. Plan to follow-up with family regarding decision.   Sent secure chat to MD (Dr. Sloan Leiter) regarding possibility of placing cortrak. Per MD, would like to avoid cortrak placement as much as possible.   8/31 Breakfast: 50 kcals, 0 grams protein Lunch: 240 kclas, 0 grams protein Dinner: 240 kcals, 0 grams protein  Total intake: 530 kcal (32% of minimum estimated needs)  0 grams protein (0% of minimum estimated needs)  9/1 Breakfast: 149 kcals, 2 grams protein Lunch: 0 kcals, 0 grams protein Dinner: nothing recorded yet  Total intake: 149 kcal (9% of minimum estimated needs)  2 grams protein (3% of minimum estimated needs)  Nutrition Dx: Inadequate oral intake related to poor appetite as evidenced by meal completion < 25%; ongoing  Goal: Patient will meet greater than or equal to 90% of their needs; progressing  Intervention:   -Continue 48 hour calorie  count -Continue Boost Breeze po TID, each supplement provides 250 kcal and 9 grams of protein  -Continue Magic cup TID with meals, each supplement provides 290 kcal and 9 grams of protein  -Consider initial of oral nutrition support if PO intake remains inadequate:  Initiate Osmolite 1.2 @ 20 ml/hr and increase by 10 ml every 12 hours to goal rate of 60 ml/hr.   110 ml free water flush every 6 hours  Tube feeding regimen provides 1728 kcal (100% of needs), 80 grams of protein, and 1181 ml of H2O.  Total free water: 1841 ml daily  -If TF is pursued, monitor Mg, K, and Phos and replete as needed due to high refeeding risk  Loistine Chance, RD, LDN, [place] Registered Dietitian II Certified Diabetes Care and Education Specialist Please refer to [facility] for RD and/or RD on-call/weekend/after hours pager

## 2020-11-17 NOTE — Progress Notes (Addendum)
PROGRESS NOTE        PATIENT DETAILS Name: [name] Age: 75 y.o. Sex: female Date of Birth: 29-Sep-1945 Admit Date: 11/15/2020 Admitting Physician Vernelle Emerald, MD VOZ:DGUYQI, Jori Moll, MD  Brief Narrative: Patient is a 75 y.o. female with history of CAD, HF PEF, right breast cancer-s/p lumpectomy, right renal vein thrombosis (2019), RLE DVT (06/2020-s/p 3 months of Eliquis), CKD stage IV-recent COVID-19 infection-who presented to the ED with shortness of breath and lightheadedness.  She was subsequently admitted to the hospitalist service for further evaluation and treatment.  Significant events: 8/30>> admit for evaluation of SOB/lightheadedness.  Significant studies: 8/30>> CXR: No mass/consolidation. 8/31>> VQ scan: No PE 8/31>> bilateral lower extremity Doppler: No DVT  Antimicrobial therapy: None  Microbiology data: 8/16>> COVID/influenza PCR: Positive 8/30>> COVID/influenza PCR: Negative  Procedures : None  Consults: Phone consult with oncologist-Dr. Burr Medico  DVT Prophylaxis : Place TED hose Start: 11/17/20 0806 heparin injection 5,000 Units Start: 11/16/20 1400IV heparin  Subjective: No major events overnight-she still has 1-2+ pitting edema.  Orthostatic vital signs positive with physical therapy yesterday.  Assessment/Plan: Shortness of breath: No evidence of VTE on VQ scan/Doppler-suspect given some lower extremity edema-she may have had a small amount of pulmonary edema/HFpEF.  Tolerating dialysis well-still with some pitting edema extending up to the shins.  Per patient-she felt better when she ambulated with physical therapy yesterday without any major shortness of breath.  We will continue IV diuretics for 1 more day-an attempt to transition her to oral diuretic regimen tomorrow.  Spoke with daughter and patient-both agreeable with plan.   Lightheadedness: Unclear etiology-probably related to perhaps orthostatic  mechanism-telemetry continues to be negative.  Recent echo in April 2022 was with preserved EF.  Place thigh-high TED stockings-hold nitrates for now-and reassess tomorrow.    Orthostatic hypotension: Thankfully asymptomatic with physical therapy yesterday.  Unclear whether this is from underlying diabetic neuropathy or from antihypertensive/diuretic use.  We will stop nitrates-continue diuretics/beta-blocker for now.  Compression stockings to be applied today.  HFpEF with exacerbation: Likely the cause of SOB on presentation-continues to have edema up to shins-we will continue diuretics for 1 additional day before consideration of discharge.    CKD stage IV: Creatinine close to baseline-watch closely-avoid nephrotoxic agents.  Hypokalemia: Due to diuretic use-replete and recheck.  Anemia: Due to CKD-on erythropoietin injections as an outpatient.  Spoke with Dr. Blondell Reveal will order vitamin B12 and erythropoietin injections.  DM-2 (A1c 5.21 August 2020): CBGs stable with SSI.  Resume Tradjenta on discharge.  Recent Labs    11/16/20 1628 11/16/20 2122 11/17/20 0618  GLUCAP 174* 70 94     HTN: BP stable-continue hydralazine, Imdur, Bystolic  History of VTE (RLE DVT April 2022/right renal vein thrombosis 2019): No longer on anticoagulation.    Spoke with Dr. Burr Medico on 9/1-given her prior history of GI bleeding/multiple comorbidities-after discussion with the family-anticoagulation was only recommended for 3 months when she had a DVT.  We will defer further discussion to the outpatient setting.  History of ?Crohn's disease s/p multiple colectomy: Stable-continue Enterocort  GERD: Continue PPI  History of breast cancer-s/p right lumpectomy: Follows with oncology  History of anxiety/depression: Continue fluoxetine  Unintentional weight loss: TSH unremarkable-numerous recent imaging studies/endoscopies were all negative-defer further work-up to the outpatient work-up.  Diet: Diet Order  Diet regular Room service appropriate? Yes; Fluid consistency: Thin  Diet effective now                    Code Status: Full code   Family Communication: Daughter at bedside this morning.  Granddaughter's and room yesterday.  Disposition Plan: Status is: Observation  The patient will require care spanning > 2 midnights and should be moved to inpatient because: Inpatient level of care appropriate due to severity of illness  Dispo: The patient is from: Home              Anticipated d/c is to: Home              Patient currently is not medically stable to d/c.   Difficult to place patient No    Barriers to Discharge: Continues to have volume overload-on IV diuretics for another 24 hours before consideration of discharge.  Antimicrobial agents: Anti-infectives (From admission, onward)    None        Time spent: 25 minutes-Greater than 50% of this time was spent in counseling, explanation of diagnosis, planning of further management, and coordination of care.  MEDICATIONS: Scheduled Meds:  aspirin EC  81 mg Oral Daily   budesonide  3 mg Oral Q breakfast   feeding supplement  1 Container Oral TID BM   FLUoxetine  20 mg Oral QHS   furosemide  80 mg Intravenous BID   heparin injection (subcutaneous)  5,000 Units Subcutaneous Q8H   hydrALAZINE  100 mg Oral Q8H   insulin aspart  0-15 Units Subcutaneous TID AC & HS   lipase/protease/amylase  36,000 Units Oral TID WC   nebivolol  10 mg Oral Daily   pantoprazole  40 mg Oral Daily   potassium chloride  40 mEq Oral Daily   sucralfate  1 g Oral TID WC & HS   Continuous Infusions:   PRN Meds:.acetaminophen **OR** acetaminophen, loperamide, melatonin, ondansetron **OR** ondansetron (ZOFRAN) IV, polyethylene glycol   PHYSICAL EXAM: Vital signs: Vitals:   11/17/20 0000 11/17/20 0300 11/17/20 0615 11/17/20 0805  BP: (!) 117/57 (!) 113/55 140/64 130/69  Pulse: 68 75 77 85  Resp: 16 20 12 16   Temp:  98.5  F (36.9 C)  98.4 F (36.9 C)  TempSrc:  Oral  Oral  SpO2: 97% 98% 99% 98%  Weight:      Height:       Filed Weights   11/15/20 1621 11/15/20 2151  Weight: 65.2 kg 59.1 kg   Body mass index is 22.36 kg/m.   Gen Exam:Alert awake-not in any distress HEENT:atraumatic, normocephalic Chest: B/L clear to auscultation anteriorly CVS:S1S2 regular Abdomen:soft non tender, non distended Extremities: Edema up to shins bilaterally. Neurology: Non focal Skin: no rash  I have personally reviewed following labs and imaging studies  LABORATORY DATA: CBC: Recent Labs  Lab 11/15/20 1330 11/16/20 0038 11/17/20 0042  WBC 7.2 8.6 7.9  NEUTROABS 5.9 6.2  --   HGB 10.8* 9.7* 9.1*  HCT 33.2* 29.4* 28.7*  MCV 82.2 79.7* 81.5  PLT 206 192 185     Basic Metabolic Panel: Recent Labs  Lab 11/15/20 1330 11/16/20 0038 11/17/20 0042  NA 139 140 139  K 3.4* 2.9* 3.4*  CL 108 113* 111  CO2 18* 18* 17*  GLUCOSE 124* 92 90  BUN 30* 26* 23  CREATININE 2.58* 2.26* 2.19*  CALCIUM 9.1 8.8* 8.8*  MG  --  1.1* 2.3     GFR: Estimated Creatinine  Clearance: 19.8 mL/min (A) (by C-G formula based on SCr of 2.19 mg/dL (H)).  Liver Function Tests: Recent Labs  Lab 11/15/20 1330 11/16/20 0038  AST 55* 33  ALT 44 35  ALKPHOS 76 71  BILITOT 0.7 0.7  PROT 6.9 6.1*  ALBUMIN 3.2* 2.8*    No results for input(s): LIPASE, AMYLASE in the last 168 hours. No results for input(s): AMMONIA in the last 168 hours.  Coagulation Profile: No results for input(s): INR, PROTIME in the last 168 hours.  Cardiac Enzymes: No results for input(s): CKTOTAL, CKMB, CKMBINDEX, TROPONINI in the last 168 hours.  BNP (last 3 results) No results for input(s): PROBNP in the last 8760 hours.  Lipid Profile: Recent Labs    11/16/20 0119  CHOL 117  HDL 59  LDLCALC 40  TRIG 89  CHOLHDL 2.0     Thyroid Function Tests: Recent Labs    11/15/20 1827  TSH 1.694     Anemia Panel: No results for  input(s): VITAMINB12, FOLATE, FERRITIN, TIBC, IRON, RETICCTPCT in the last 72 hours.  Urine analysis:    Component Value Date/Time   COLORURINE YELLOW 11/16/2020 1901   APPEARANCEUR HAZY (A) 11/16/2020 1901   APPEARANCEUR Clear 08/22/2018 1620   LABSPEC 1.011 11/16/2020 1901   LABSPEC 1.020 03/03/2014 1341   PHURINE 5.0 11/16/2020 1901   GLUCOSEU NEGATIVE 11/16/2020 1901   GLUCOSEU Negative 03/03/2014 1341   HGBUR NEGATIVE 11/16/2020 1901   BILIRUBINUR NEGATIVE 11/16/2020 1901   BILIRUBINUR Negative 08/22/2018 1620   BILIRUBINUR Negative 03/03/2014 1341   KETONESUR NEGATIVE 11/16/2020 1901   PROTEINUR 30 (A) 11/16/2020 1901   UROBILINOGEN 0.2 03/03/2014 1341   NITRITE NEGATIVE 11/16/2020 1901   LEUKOCYTESUR NEGATIVE 11/16/2020 1901   LEUKOCYTESUR Negative 03/03/2014 1341    Sepsis Labs: Lactic Acid, Venous    Component Value Date/Time   LATICACIDVEN 1.4 11/15/2020 1835    MICROBIOLOGY: Recent Results (from the past 240 hour(s))  Resp Panel by RT-PCR (Flu A&B, Covid) Nasopharyngeal Swab     Status: None   Collection Time: 11/15/20  6:05 PM   Specimen: Nasopharyngeal Swab; Nasopharyngeal(NP) swabs in vial transport medium  Result Value Ref Range Status   SARS Coronavirus 2 by RT PCR NEGATIVE NEGATIVE Final    Comment: (NOTE) SARS-CoV-2 target nucleic acids are NOT DETECTED.  The SARS-CoV-2 RNA is generally detectable in upper respiratory specimens during the acute phase of infection. The lowest concentration of SARS-CoV-2 viral copies this assay can detect is 138 copies/mL. A negative result does not preclude SARS-Cov-2 infection and should not be used as the sole basis for treatment or other patient management decisions. A negative result may occur with  improper specimen collection/handling, submission of specimen other than nasopharyngeal swab, presence of viral mutation(s) within the areas targeted by this assay, and inadequate number of viral copies(<138  copies/mL). A negative result must be combined with clinical observations, patient history, and epidemiological information. The expected result is Negative.  Fact Sheet for Patients:  EntrepreneurPulse.com.au  Fact Sheet for Healthcare Providers:  IncredibleEmployment.be  This test is no t yet approved or cleared by the [place] FDA and  has been authorized for detection and/or diagnosis of SARS-CoV-2 by FDA under an Emergency Use Authorization (EUA). This EUA will remain  in effect (meaning this test can be used) for the duration of the COVID-19 declaration under Section 564(b)(1) of the Act, 21 U.S.C.section 360bbb-3(b)(1), unless the authorization is terminated  or revoked sooner.  Influenza A by PCR NEGATIVE NEGATIVE Final   Influenza B by PCR NEGATIVE NEGATIVE Final    Comment: (NOTE) The Xpert Xpress SARS-CoV-2/FLU/RSV plus assay is intended as an aid in the diagnosis of influenza from Nasopharyngeal swab specimens and should not be used as a sole basis for treatment. Nasal washings and aspirates are unacceptable for Xpert Xpress SARS-CoV-2/FLU/RSV testing.  Fact Sheet for Patients: EntrepreneurPulse.com.au  Fact Sheet for Healthcare Providers: IncredibleEmployment.be  This test is not yet approved or cleared by the [place] FDA and has been authorized for detection and/or diagnosis of SARS-CoV-2 by FDA under an Emergency Use Authorization (EUA). This EUA will remain in effect (meaning this test can be used) for the duration of the COVID-19 declaration under Section 564(b)(1) of the Act, 21 U.S.C. section 360bbb-3(b)(1), unless the authorization is terminated or revoked.  Performed at [facility], [place] [address] [place], [place] [address]   MRSA Next Gen by PCR, Nasal     Status: None   Collection Time: 11/15/20  9:59 PM   Specimen: Nasal Mucosa; Nasal Swab  Result  Value Ref Range Status   MRSA by PCR Next Gen NOT DETECTED NOT DETECTED Final    Comment: (NOTE) The GeneXpert MRSA Assay (FDA approved for NASAL specimens only), is one component of a comprehensive MRSA colonization surveillance program. It is not intended to diagnose MRSA infection nor to guide or monitor treatment for MRSA infections. Test performance is not FDA approved in patients less than 68 years old. Performed at [facility], [place] [address] [place], [place] [address]     RADIOLOGY STUDIES/RESULTS: DG Chest 2 View  Result Date: 11/15/2020 CLINICAL DATA:  weakness EXAM: CHEST - 2 VIEW COMPARISON:  None. FINDINGS: Vascular stent overlying the heart. The cardiomediastinal silhouette is within normal limits. No pleural effusion. No pneumothorax. No mass or consolidation. No acute osseous abnormality. Right axillary surgical clips. IMPRESSION: No acute abnormality in the chest. Electronically Signed   By: Albin Felling M.D.   On: 11/15/2020 14:38   NM Pulmonary Perfusion  Result Date: 11/16/2020 CLINICAL DATA:  Nonspecific chest pain EXAM: NUCLEAR MEDICINE PERFUSION LUNG SCAN TECHNIQUE: Perfusion images were obtained in multiple projections after intravenous injection of radiopharmaceutical. Ventilation scans intentionally deferred if perfusion scan and chest x-ray adequate for interpretation during COVID 19 epidemic. RADIOPHARMACEUTICALS:  4.3 mCi Tc-64mMAA IV COMPARISON:  06/29/2020 FINDINGS: Slight irregularity of perfusion in the peripheral aspect of the RIGHT lung unchanged, in a nonsegmental pattern. No segmental or subsegmental perfusion defects identified. IMPRESSION: No evidence of pulmonary embolism. Stable exam versus 06/29/2020 Electronically Signed   By: MLavonia DanaM.D.   On: 11/16/2020 11:33   VAS [place]OWER EXTREMITY VENOUS (DVT)  Result Date: 11/16/2020  Lower Venous DVT Study Patient Name:  Zakirah JMartiniqueSTARKES  Date of Exam:   11/16/2020 Medical Rec #: [id number]           Accession #:    [id number] of Birth: [birth year]          Patient Gender: F Patient Age:   89years [facility]      VAS [place]OWER EXTREMITY VENOUS (DVT) Referring Phys: GInda Merlin--------------------------------------------------------------------------------  Indications: Edema.  Comparison Study: 06/29/20 prior Performing Technologist: MArchie PattenRVS  Examination Guidelines: A complete evaluation includes B-mode imaging, spectral Doppler, color Doppler, and power Doppler as needed of all accessible portions of each vessel. Bilateral testing is considered  an integral part of a complete examination. Limited examinations for reoccurring indications may be performed as noted. The reflux portion of the exam is performed with the patient in reverse Trendelenburg.  +---------+---------------+---------+-----------+----------+-------------------+ RIGHT    CompressibilityPhasicitySpontaneityPropertiesThrombus Aging      +---------+---------------+---------+-----------+----------+-------------------+ CFV      Full           Yes      Yes                                      +---------+---------------+---------+-----------+----------+-------------------+ SFJ      Full                                                             +---------+---------------+---------+-----------+----------+-------------------+ FV Prox  Full                                                             +---------+---------------+---------+-----------+----------+-------------------+ FV Mid   Full                                                             +---------+---------------+---------+-----------+----------+-------------------+ FV DistalFull                                                             +---------+---------------+---------+-----------+----------+-------------------+ PFV      Full                                                              +---------+---------------+---------+-----------+----------+-------------------+ POP      Full           Yes      Yes                                      +---------+---------------+---------+-----------+----------+-------------------+ PTV      Full                                                             +---------+---------------+---------+-----------+----------+-------------------+ PERO  Not well visualized +---------+---------------+---------+-----------+----------+-------------------+   +---------+---------------+---------+-----------+----------+-------------------+ LEFT     CompressibilityPhasicitySpontaneityPropertiesThrombus Aging      +---------+---------------+---------+-----------+----------+-------------------+ CFV      Full           Yes      Yes                                      +---------+---------------+---------+-----------+----------+-------------------+ SFJ      Full                                                             +---------+---------------+---------+-----------+----------+-------------------+ FV Prox  Full                                                             +---------+---------------+---------+-----------+----------+-------------------+ FV Mid   Full                                                             +---------+---------------+---------+-----------+----------+-------------------+ FV DistalFull                                                             +---------+---------------+---------+-----------+----------+-------------------+ PFV      Full                                                             +---------+---------------+---------+-----------+----------+-------------------+ POP      Full           Yes      Yes                                       +---------+---------------+---------+-----------+----------+-------------------+ PTV      Full                                                             +---------+---------------+---------+-----------+----------+-------------------+ PERO                                                  Not well visualized +---------+---------------+---------+-----------+----------+-------------------+  Summary: BILATERAL: - No evidence of deep vein thrombosis seen in the lower extremities, bilaterally. -No evidence of popliteal cyst, bilaterally.   *See table(s) above for measurements and observations. Electronically signed by Harold Barban MD on 11/16/2020 at 6:29:59 PM.    Final      LOS: 0 days   Oren Binet, MD  [facility]    To contact the attending provider between 7A-7P or the covering provider during after hours 7P-7A, please log into the web site [email or web address] and access using universal [place] password for that web site. If you do not have the password, please call the hospital operator.  11/17/2020, 12:07 PM

## 2020-11-17 NOTE — Plan of Care (Signed)
  Problem: Education: Goal: Knowledge of General Education information will improve Description: Including pain rating scale, medication(s)/side effects and non-pharmacologic comfort measures Outcome: Progressing   Problem: Clinical Measurements: Goal: Ability to maintain clinical measurements within normal limits will improve Outcome: Progressing Goal: Will remain free from infection Outcome: Progressing Goal: Respiratory complications will improve Outcome: Progressing Goal: Cardiovascular complication will be avoided Outcome: Progressing   Problem: Pain Managment: Goal: General experience of comfort will improve Outcome: Progressing   Problem: Safety: Goal: Ability to remain free from injury will improve Outcome: Progressing

## 2020-11-17 NOTE — Evaluation (Signed)
Occupational Therapy Evaluation Patient Details Name: [name] MRN: [id number] DOB: 1945/09/29 Today's Date: 11/17/2020    History of Present Illness Pt is 75 yo female who presented with shortness of breath and lightheadedness on 11/15/20.  Pt with hx including recent COVID 22, CAD, HF, R breast CA s/p lumpectomy, R renal vein thrombosis, R LE DVT, and CKD.   Clinical Impression   PTA, pt lives alone and reports Independence in all daily tasks. Per family report, pt with progressive difficulty with IADLs, mobility (using Rollator/RW instead of no AD) and with recent falls. Pt presents now with deficits below and limited eval completed due to pt confusion. Pt overall Supervision for bed mobility but declined further OOB activities, difficulty attending to tasks and slow processing time noted. Hope to further assess OOB ADLs prior to making finalized DC recs (daughter interested in CIR).      Follow Up Recommendations  Other (comment) (TBD; likely postacute rehab venue but will need to further assess OOB ADLs)    Equipment Recommendations  Other (comment) (TBD)    Recommendations for Other Services       Precautions / Restrictions Precautions Precautions: Fall;Other (comment) Precaution Comments: monitor orthostatic BP Restrictions Weight Bearing Restrictions: No      Mobility Bed Mobility Overal bed mobility: Needs Assistance Bed Mobility: Supine to Sit;Sit to Supine     Supine to sit: Supervision;HOB elevated Sit to supine: Supervision   General bed mobility comments: increased time but no assist needed    Transfers                 General transfer comment: declined    Balance Overall balance assessment: Needs assistance Sitting-balance support: No upper extremity supported Sitting balance-Leahy Scale: Good                                     ADL either performed or assessed with clinical judgement   ADL Overall ADL's : Needs  assistance/impaired Eating/Feeding: Independent;Sitting Eating/Feeding Details (indicate cue type and reason): opened OJ container Grooming: Set up;Sitting;Wash/dry face                                 General ADL Comments: Limited eval due to pt confusion and resistance for OOB activities (vague answers on participation and if better time for OT to come back). Noted MIn A with mobility with PT yesterday, so anticipate likely Min A at least for LB ADLs at this time. Will further assess     Vision Ability to See in Adequate Light: 0 Adequate Patient Visual Report: No change from baseline Vision Assessment?: No apparent visual deficits     Perception     Praxis      Pertinent Vitals/Pain Pain Assessment: No/denies pain     Hand Dominance Right   Extremity/Trunk Assessment Upper Extremity Assessment Upper Extremity Assessment: Generalized weakness;LUE deficits/detail RUE Deficits / Details: ROM : overall WFL, mild limitation in shoulder; MMT: 4+/5 LUE Deficits / Details: ROM: elbow/hand WFL, shoulder - very limited (~10-20 degrees flex) baseline; MMT: elbow/hand 4+/5, shoulder 2/5 LUE Coordination: decreased gross motor   Lower Extremity Assessment Lower Extremity Assessment: Defer to PT evaluation   Cervical / Trunk Assessment Cervical / Trunk Assessment: Normal   Communication Communication Communication: No difficulties   Cognition Arousal/Alertness: Awake/alert Behavior During Therapy: Flat affect Overall Cognitive  Status: Impaired/Different from baseline Area of Impairment: Orientation;Memory;Attention;Following commands;Safety/judgement;Awareness;Problem solving                 Orientation Level: Disoriented to;Situation Current Attention Level: Sustained Memory: Decreased short-term memory Following Commands: Follows one step commands with increased time;Follows one step commands inconsistently Safety/Judgement: Decreased awareness of  safety;Decreased awareness of deficits Awareness: Intellectual Problem Solving: Slow processing;Decreased initiation;Difficulty sequencing;Requires verbal cues;Requires tactile cues General Comments: Pt confused, flat affect and noted contradictory info with PLOF from daughter (w/ PT yesterday). Pt initially reported need to use bathroom, initiated bed mobility then pulled tray table over to her and denied need for bathroom use after OT set up clear path for bathroom mobility. very slow processing and response time   General Comments  HR, SpO2 WFL on RA. denies any dizziness sitting EOB (noted orthostatic [name] with PT yesterday)    Exercises     Shoulder Instructions      Home Living Family/patient expects to be discharged to:: Private residence Living Arrangements: Alone Available Help at Discharge: Family;Available PRN/intermittently Type of Home: House Home Access: Level entry     Home Layout: Two level;Able to live on main level with bedroom/bathroom     Bathroom Shower/Tub: Walk-in shower;Tub/shower unit   Bathroom Toilet: Standard Bathroom Accessibility: Yes   Home Equipment: Walker - 2 wheels;Walker - 4 wheels   Additional Comments: Daughter provided/corrected hx after initial PT eval      Prior Functioning/Environment Level of Independence: Needs assistance  Gait / Transfers Assistance Needed: Pt was ambulatory but falling about 1 x  per month; Using a RW or rollator lately.  Lately requiring assist to get up stairs. ADL's / Homemaking Assistance Needed: Lately: Performs baths, brushing teeth, dressing on her own but needs assist with combing hair and IADLs and toileting.   Comments: Pt was independent until 4 months ago. Pt poor historian, denies any falls and reports still completing IADLs/driving        OT Problem List: Decreased strength;Decreased activity tolerance;Impaired balance (sitting and/or standing);Decreased cognition      OT Treatment/Interventions:  Self-care/ADL training;Therapeutic exercise;Energy conservation;DME and/or AE instruction;Therapeutic activities;Patient/family education;Balance training    OT Goals(Current goals can be found in the care plan section) Acute Rehab OT Goals Patient Stated Goal: eat breakfast OT Goal Formulation: Patient unable to participate in goal setting Time For Goal Achievement: 12/01/20 Potential to Achieve Goals: Good ADL Goals Pt Will Perform Grooming: with modified independence;standing Pt Will Perform Lower Body Dressing: with set-up;sit to/from stand;sitting/lateral leans Pt Will Transfer to Toilet: with supervision;ambulating Additional ADL Goal #1: Pt to verbalize at least 2 fall prevention strategies to implement at home.  OT Frequency: Min 2X/week   Barriers to D/C:            Co-evaluation              AM-PAC OT "6 Clicks" Daily Activity     Outcome Measure Help from another person eating meals?: None Help from another person taking care of personal grooming?: A Little Help from another person toileting, which includes using toliet, bedpan, or urinal?: A Little Help from another person bathing (including washing, rinsing, drying)?: A Little Help from another person to put on and taking off regular upper body clothing?: A Little Help from another person to put on and taking off regular lower body clothing?: A Little 6 Click Score: 19   End of Session Nurse Communication: Mobility status  Activity Tolerance: Other (comment) (limited by cognition) Patient  left: in bed;with call bell/phone within reach  OT Visit Diagnosis: Unsteadiness on feet (R26.81);Other abnormalities of gait and mobility (R26.89);Muscle weakness (generalized) (M62.81);Other symptoms and signs involving cognitive function                Time: 9923-4144 OT Time Calculation (min): 20 min Charges:  OT General Charges $OT Visit: 1 Visit OT Evaluation $OT Eval Low Complexity: 1 Low  Malachy Chamber, OTR/L Acute  Rehab Services Office: [phone]   Layla Maw 11/17/2020, 7:40 AM

## 2020-11-17 NOTE — Progress Notes (Signed)
Inpatient Rehab Admissions Coordinator Note:  Per therapy recommendations patient was screened for CIR candidacy by Michel Santee, PT, DPT. At this time, pt does not appear to demonstrate medical necessity to support a CIR admission.  We will not pursue a rehab consult at this time.  Recommend other rehab venues to be pursued.  Please contact me with any questions.  Michel Santee, [place] [phone] @TODAY @ 10:11 AM

## 2020-11-18 ENCOUNTER — Ambulatory Visit: Payer: Medicare Other

## 2020-11-18 ENCOUNTER — Other Ambulatory Visit: Payer: Medicare Other

## 2020-11-18 LAB — BASIC METABOLIC PANEL
Anion gap: 9 (ref 5–15)
BUN: 24 mg/dL — ABNORMAL HIGH (ref 8–23)
CO2: 21 mmol/L — ABNORMAL LOW (ref 22–32)
Calcium: 9.2 mg/dL (ref 8.9–10.3)
Chloride: 108 mmol/L (ref 98–111)
Creatinine, Ser: 2.32 mg/dL — ABNORMAL HIGH (ref 0.44–1.00)
GFR, Estimated: 22 mL/min — ABNORMAL LOW (ref >60)
Glucose, Bld: 73 mg/dL (ref 70–99)
Potassium: 3.6 mmol/L (ref 3.5–5.1)
Sodium: 138 mmol/L (ref 135–145)

## 2020-11-18 LAB — GLUCOSE, CAPILLARY
Glucose-Capillary: 96 mg/dL (ref 70–99)
Glucose-Capillary: 160 mg/dL — ABNORMAL HIGH (ref 70–99)
Glucose-Capillary: 135 mg/dL — ABNORMAL HIGH (ref 70–99)
Glucose-Capillary: 117 mg/dL — ABNORMAL HIGH (ref 70–99)

## 2020-11-18 LAB — BRAIN NATRIURETIC PEPTIDE: B Natriuretic Peptide: 96.5 pg/mL (ref 0.0–100.0)

## 2020-11-18 LAB — MAGNESIUM: Magnesium: 2.1 mg/dL (ref 1.7–2.4)

## 2020-11-18 MED ORDER — NEBIVOLOL HCL 5 MG PO TABS
5.0000 mg | ORAL_TABLET | Freq: Every day | ORAL | Status: DC
  Administered 2020-11-18 – 2020-11-19 (×2): 5 mg via ORAL
  Filled 2020-11-18 (×2): qty 1

## 2020-11-18 MED ORDER — MIRTAZAPINE 7.5 MG PO TABS
7.5000 mg | ORAL_TABLET | Freq: Every day | ORAL | Status: DC
  Administered 2020-11-18: 7.5 mg via ORAL
  Filled 2020-11-18: qty 1

## 2020-11-18 MED ORDER — FUROSEMIDE 40 MG PO TABS
80.0000 mg | ORAL_TABLET | Freq: Every day | ORAL | Status: DC
  Administered 2020-11-18: 80 mg via ORAL
  Filled 2020-11-18: qty 2

## 2020-11-18 NOTE — Progress Notes (Signed)
Occupational Therapy Treatment Patient Details Name: [name] MRN: [id number] DOB: Jun 18, 1945 Today's Date: 11/18/2020    History of present illness Pt is 75 yo female who presented with shortness of breath and lightheadedness on 11/15/20.  Pt with hx including recent COVID 68, CAD, HF, R breast CA s/p lumpectomy, R renal vein thrombosis, R LE DVT, and CKD.   OT comments  Patient seated in chair upon entry, agreeable to OT. Completing mobility in room with HHA and min guard, transfers with min guard.  LB ADLs with min guard and grooming with supervision.  Pt fatigues easily, requires cueing for safety awareness, problem solving.  Believe she will best benefit from [facility] services with 24/7 support at dc, if 24/7 not available will benefit from SNF (noted CIR declined due to medical necessity). Will follow.    Follow Up Recommendations  Home health OT;Supervision/Assistance - 24 hour    Equipment Recommendations  3 in 1 bedside commode    Recommendations for Other Services      Precautions / Restrictions Precautions Precautions: Fall;Other (comment) Precaution Comments: monitor orthostatic BP Restrictions Weight Bearing Restrictions: No       Mobility Bed Mobility               General bed mobility comments: OOB upon entry    Transfers Overall transfer level: Needs assistance Equipment used: None Transfers: Sit to/from Stand Sit to Stand: Min guard         General transfer comment: min guard for safety    Balance Overall balance assessment: Needs assistance Sitting-balance support: No upper extremity supported;Feet supported Sitting balance-Leahy Scale: Good     Standing balance support: No upper extremity supported;Single extremity supported;During functional activity Standing balance-Leahy Scale: Poor Standing balance comment: pt preference to UE support                           ADL either performed or assessed with clinical judgement    ADL Overall ADL's : Needs assistance/impaired     Grooming: Supervision/safety;Oral care;Sitting;Standing Grooming Details (indicate cue type and reason): sitting/standing at sink for oral care with supervision             Lower Body Dressing: Min guard;Sit to/from stand Lower Body Dressing Details (indicate cue type and reason): donning socks in sitting with supervision, min guard sit to stand Toilet Transfer: Min guard;Ambulation Toilet Transfer Details (indicate cue type and reason): hand held assist simulated in room         Functional mobility during ADLs: Min guard General ADL Comments: decreased safety awareness and tolerance, pt in compression socks in room and reports has been transferring to commode without assist     Vision       Perception     Praxis      Cognition Arousal/Alertness: Awake/alert Behavior During Therapy: Flat affect Overall Cognitive Status: Impaired/Different from baseline Area of Impairment: Following commands;Awareness;Problem solving;Safety/judgement;Memory                     Memory: Decreased short-term memory Following Commands: Follows one step commands consistently;Follows one step commands with increased time;Follows multi-step commands inconsistently Safety/Judgement: Decreased awareness of safety;Decreased awareness of deficits Awareness: Emergent Problem Solving: Slow processing;Requires verbal cues General Comments: pt with slow processing, decreased safety awareness and problem sovling.  Pt initally reports feeling at baseline, but then reports feeling weaker then normal.        Exercises  Shoulder Instructions       General Comments VSS on RA, BP 188/59    Pertinent Vitals/ Pain       Pain Assessment: No/denies pain  Home Living                                          Prior Functioning/Environment              Frequency  Min 2X/week        Progress Toward Goals  OT  Goals(current goals can now be found in the care plan section)  Progress towards OT goals: Progressing toward goals  Acute Rehab OT Goals Patient Stated Goal: get better OT Goal Formulation: With patient  Plan Frequency remains appropriate;Discharge plan needs to be updated    Co-evaluation                 AM-PAC OT "6 Clicks" Daily Activity     Outcome Measure   Help from another person eating meals?: None Help from another person taking care of personal grooming?: A Little Help from another person toileting, which includes using toliet, bedpan, or urinal?: A Little Help from another person bathing (including washing, rinsing, drying)?: A Little Help from another person to put on and taking off regular upper body clothing?: A Little Help from another person to put on and taking off regular lower body clothing?: A Little 6 Click Score: 19    End of Session    OT Visit Diagnosis: Unsteadiness on feet (R26.81);Other abnormalities of gait and mobility (R26.89);Muscle weakness (generalized) (M62.81);Other symptoms and signs involving cognitive function   Activity Tolerance Patient tolerated treatment well   Patient Left in chair;with call bell/phone within reach   Nurse Communication Mobility status        Time: 2563-8937 OT Time Calculation (min): 16 min  Charges: OT General Charges $OT Visit: 1 Visit OT Treatments $Self Care/Home Management : 8-22 mins  Jolaine Artist, OT Acute Rehabilitation Services Pager [phone] Office [place] 11/18/2020, 10:10 AM

## 2020-11-18 NOTE — Progress Notes (Signed)
PROGRESS NOTE        PATIENT DETAILS Name: Dyamon [name] Schaffer Age: 75 y.o. Sex: female Date of Birth: 03-18-1946 Admit Date: 11/15/2020 Admitting Physician Evalee Mutton Kristeen Mans, MD VVO:[id number], Jori Moll, MD  Brief Narrative: Patient is a 75 y.o. female with history of CAD, HF PEF, right breast cancer-s/p lumpectomy, right renal vein thrombosis (2019), RLE DVT (06/2020-s/p 3 months of Eliquis), CKD stage IV-recent COVID-19 infection-who presented to the ED with shortness of breath and lightheadedness.  She was subsequently admitted to the hospitalist service for further evaluation and treatment.  Significant events: 8/30>> admit for evaluation of SOB/lightheadedness.  Significant studies: 8/30>> CXR: No mass/consolidation. 8/31>> VQ scan: No PE 8/31>> bilateral lower extremity Doppler: No DVT  Antimicrobial therapy: None  Microbiology data: 8/16>> COVID/influenza PCR: Positive 8/30>> COVID/influenza PCR: Negative  Procedures : None  Consults: Oncologist-Dr. Burr Medico  DVT Prophylaxis : Place TED hose Start: 11/17/20 0806IV heparin apixaban (ELIQUIS) tablet 5 mg   Subjective: Wants to go home-however family feels that she needs to stay 1 more day.  Does not want to pursue PEG tube feeds at all cost.  First cousin at bedside  Assessment/Plan: Shortness of breath: Resolved-no evidence of VTE on VQ scan/Doppler-had some mild lower extremity edema-diuresed with some improvement.  Plan is to change to oral diuretics today.  Evaluated by oncology on 9/1-given the fact that she had to [facility] has been started on Eliquis.   Lightheadedness: Unclear etiology-has resolved-probably related to perhaps orthostatic mechanism-telemetry continues to be negative.  Recent echo in April 2022 was with preserved EF.  Continue thigh-high TED stockings.  Orthostatic hypotension: Asymptomatic-mild-either from diabetic neuropathy or antihypertensive medication use.  Decrease  bisoprolol to 5 mg-remains on Lasix-hydralazine/Imdur held.  Will need to continue close monitoring and optimization is much as possible.  HFpEF with exacerbation: Likely the cause of SOB on presentation-overall improved-changed to oral diuretics.  CKD stage IV: Creatinine close to baseline-watch closely-avoid nephrotoxic agents.  Hypokalemia: Repleted.  Multifactorial anemia: Due to CKD-chronic disease-hemoglobin stable.  Follow oncology recommendation-s/p Aranesp and vitamin B12 injection on 9/1.  Resume outpatient vitamin B12 and erythropoietin injections as previous.    DM-2 (A1c 5.21 August 2020): CBGs stable with SSI.  Resume Tradjenta on discharge.  Recent Labs    11/17/20 1623 11/17/20 2136 11/18/20 0621  GLUCAP 118* 123* 96     HTN: BP stable stable-challenging due to orthostatic mechanism-failure to thrive syndrome-currently only on Bystolic and furosemide.  Continue to hold hydralazine and Imdur.  We will need to follow with PCP for close optimization.  History of VTE (RLE DVT April 2022/right renal vein thrombosis 2019): Reassessed by oncology on 9/1-after extensive discussion with family-restarted on Eliquis.  History of ?Crohn's disease s/p multiple colectomy: Stable-continue Enterocort  GERD: Continue PPI  History of breast cancer-s/p right lumpectomy: Follows with oncology  History of anxiety/depression: Continue fluoxetine  Unintentional weight loss/failure to thrive syndrome/poor appetite: TSH unremarkable-numerous recent imaging studies/endoscopies were all negative-defer further work-up to the outpatient work-up.  Have started Remeron-plan is to liberalize diet is much as possible.  Patient not keen on pursuing feeding tube.  Diet: Diet Order             Diet regular Room service appropriate? Yes; Fluid consistency: Thin  Diet effective now  Code Status: Full code   Family Communication: First cousin at bedside this morning-claims  she will update rest of the family.  Called daughter-[name] Henrene Pastor [phone] this morning-got voicemail.  Disposition Plan: Status is: Inpatient  The patient will require care spanning > 2 midnights and should be moved to inpatient because: Inpatient level of care appropriate due to severity of illness  Dispo: The patient is from: Home              Anticipated d/c is to: Home              Patient currently is not medically stable to d/c.   Difficult to place patient No    Barriers to Discharge: Transitioning to oral diuretic regimen-with plans to discharge home on 9/3.  Antimicrobial agents: Anti-infectives (From admission, onward)    None        Time spent: 25 minutes-Greater than 50% of this time was spent in counseling, explanation of diagnosis, planning of further management, and coordination of care.  MEDICATIONS: Scheduled Meds:  apixaban  5 mg Oral BID   aspirin EC  81 mg Oral Daily   budesonide  3 mg Oral Q breakfast   feeding supplement  1 Container Oral TID BM   FLUoxetine  20 mg Oral QHS   furosemide  80 mg Oral Daily   hydrALAZINE  100 mg Oral Q8H   insulin aspart  0-15 Units Subcutaneous TID AC & HS   lipase/protease/amylase  36,000 Units Oral TID WC   mirtazapine  7.5 mg Oral QHS   nebivolol  5 mg Oral Daily   ondansetron (ZOFRAN) IV  4 mg Intravenous TID AC & HS   pantoprazole  40 mg Oral Daily   potassium chloride  40 mEq Oral Daily   sucralfate  1 g Oral TID WC & HS   Continuous Infusions:   PRN Meds:.acetaminophen **OR** acetaminophen, loperamide, melatonin, polyethylene glycol   PHYSICAL EXAM: Vital signs: Vitals:   11/18/20 0018 11/18/20 0413 11/18/20 0735 11/18/20 1129  BP: 127/67 (!) 124/55 125/65 130/63  Pulse: 70 75 70 83  Resp: 17 13 16 16   Temp: 98.2 F (36.8 C) 98.7 F (37.1 C) 98.6 F (37 C) 98.6 F (37 C)  TempSrc: Oral Oral Oral Oral  SpO2: 99% 98% 97% 99%  Weight:      Height:       Filed Weights   11/15/20 1621  11/15/20 2151  Weight: 65.2 kg 59.1 kg   Body mass index is 22.36 kg/m.   Gen Exam:Alert awake-not in any distress HEENT:atraumatic, normocephalic Chest: B/L clear to auscultation anteriorly CVS:S1S2 regular Abdomen:soft non tender, non distended Extremities: Trace edema Neurology: Non focal Skin: no rash  I have personally reviewed following labs and imaging studies  LABORATORY DATA: CBC: Recent Labs  Lab 11/15/20 1330 11/16/20 0038 11/17/20 0042  WBC 7.2 8.6 7.9  NEUTROABS 5.9 6.2  --   HGB 10.8* 9.7* 9.1*  HCT 33.2* 29.4* 28.7*  MCV 82.2 79.7* 81.5  PLT 206 192 185     Basic Metabolic Panel: Recent Labs  Lab 11/15/20 1330 11/16/20 0038 11/17/20 0042 11/18/20 0038  NA 139 140 139 138  K 3.4* 2.9* 3.4* 3.6  CL 108 113* 111 108  CO2 18* 18* 17* 21*  GLUCOSE 124* 92 90 73  BUN 30* 26* 23 24*  CREATININE 2.58* 2.26* 2.19* 2.32*  CALCIUM 9.1 8.8* 8.8* 9.2  MG  --  1.1* 2.3 2.1  GFR: Estimated Creatinine Clearance: 18.6 mL/min (A) (by C-G formula based on SCr of 2.32 mg/dL (H)).  Liver Function Tests: Recent Labs  Lab 11/15/20 1330 11/16/20 0038  AST 55* 33  ALT 44 35  ALKPHOS 76 71  BILITOT 0.7 0.7  PROT 6.9 6.1*  ALBUMIN 3.2* 2.8*    No results for input(s): LIPASE, AMYLASE in the last 168 hours. No results for input(s): AMMONIA in the last 168 hours.  Coagulation Profile: No results for input(s): INR, PROTIME in the last 168 hours.  Cardiac Enzymes: No results for input(s): CKTOTAL, CKMB, CKMBINDEX, TROPONINI in the last 168 hours.  BNP (last 3 results) No results for input(s): PROBNP in the last 8760 hours.  Lipid Profile: Recent Labs    11/16/20 0119  CHOL 117  HDL 59  LDLCALC 40  TRIG 89  CHOLHDL 2.0     Thyroid Function Tests: Recent Labs    11/15/20 1827  TSH 1.694     Anemia Panel: No results for input(s): VITAMINB12, FOLATE, FERRITIN, TIBC, IRON, RETICCTPCT in the last 72 hours.  Urine analysis:     Component Value Date/Time   COLORURINE YELLOW 11/16/2020 1901   APPEARANCEUR HAZY (A) 11/16/2020 1901   APPEARANCEUR Clear 08/22/2018 1620   LABSPEC 1.011 11/16/2020 1901   LABSPEC 1.020 03/03/2014 1341   PHURINE 5.0 11/16/2020 1901   GLUCOSEU NEGATIVE 11/16/2020 1901   GLUCOSEU Negative 03/03/2014 1341   HGBUR NEGATIVE 11/16/2020 1901   BILIRUBINUR NEGATIVE 11/16/2020 1901   BILIRUBINUR Negative 08/22/2018 1620   BILIRUBINUR Negative 03/03/2014 1341   KETONESUR NEGATIVE 11/16/2020 1901   PROTEINUR 30 (A) 11/16/2020 1901   UROBILINOGEN 0.2 03/03/2014 1341   NITRITE NEGATIVE 11/16/2020 1901   LEUKOCYTESUR NEGATIVE 11/16/2020 1901   LEUKOCYTESUR Negative 03/03/2014 1341    Sepsis Labs: Lactic Acid, Venous    Component Value Date/Time   LATICACIDVEN 1.4 11/15/2020 1835    MICROBIOLOGY: Recent Results (from the past 240 hour(s))  Resp Panel by RT-PCR (Flu A&B, Covid) Nasopharyngeal Swab     Status: None   Collection Time: 11/15/20  6:05 PM   Specimen: Nasopharyngeal Swab; Nasopharyngeal(NP) swabs in vial transport medium  Result Value Ref Range Status   SARS Coronavirus 2 by RT PCR NEGATIVE NEGATIVE Final    Comment: (NOTE) SARS-CoV-2 target nucleic acids are NOT DETECTED.  The SARS-CoV-2 RNA is generally detectable in upper respiratory specimens during the acute phase of infection. The lowest concentration of SARS-CoV-2 viral copies this assay can detect is 138 copies/mL. A negative result does not preclude SARS-Cov-2 infection and should not be used as the sole basis for treatment or other patient management decisions. A negative result may occur with  improper specimen collection/handling, submission of specimen other than nasopharyngeal swab, presence of viral mutation(s) within the areas targeted by this assay, and inadequate number of viral copies(<138 copies/mL). A negative result must be combined with clinical observations, patient history, and  epidemiological information. The expected result is Negative.  Fact Sheet for Patients:  EntrepreneurPulse.com.au  Fact Sheet for Healthcare Providers:  IncredibleEmployment.be  This test is no t yet approved or cleared by the [place] FDA and  has been authorized for detection and/or diagnosis of SARS-CoV-2 by FDA under an Emergency Use Authorization (EUA). This EUA will remain  in effect (meaning this test can be used) for the duration of the COVID-19 declaration under Section 564(b)(1) of the Act, 21 U.S.C.section 360bbb-3(b)(1), unless the authorization is terminated  or revoked sooner.  Influenza A by PCR NEGATIVE NEGATIVE Final   Influenza B by PCR NEGATIVE NEGATIVE Final    Comment: (NOTE) The Xpert Xpress SARS-CoV-2/FLU/RSV plus assay is intended as an aid in the diagnosis of influenza from Nasopharyngeal swab specimens and should not be used as a sole basis for treatment. Nasal washings and aspirates are unacceptable for Xpert Xpress SARS-CoV-2/FLU/RSV testing.  Fact Sheet for Patients: EntrepreneurPulse.com.au  Fact Sheet for Healthcare Providers: IncredibleEmployment.be  This test is not yet approved or cleared by the [place] FDA and has been authorized for detection and/or diagnosis of SARS-CoV-2 by FDA under an Emergency Use Authorization (EUA). This EUA will remain in effect (meaning this test can be used) for the duration of the COVID-19 declaration under Section 564(b)(1) of the Act, 21 U.S.C. section 360bbb-3(b)(1), unless the authorization is terminated or revoked.  Performed at [facility], [place] [address], [place], [place] [address]   MRSA Next Gen by PCR, Nasal     Status: None   Collection Time: 11/15/20  9:59 PM   Specimen: Nasal Mucosa; Nasal Swab  Result Value Ref Range Status   MRSA by PCR Next Gen NOT DETECTED NOT DETECTED Final    Comment:  (NOTE) The GeneXpert MRSA Assay (FDA approved for NASAL specimens only), is one component of a comprehensive MRSA colonization surveillance program. It is not intended to diagnose MRSA infection nor to guide or monitor treatment for MRSA infections. Test performance is not FDA approved in patients less than 42 years old. Performed at [facility], [place] [address] [place], [place] [address]     RADIOLOGY STUDIES/RESULTS: No results found.   LOS: 1 day   Oren Binet, MD  [facility]    To contact the attending provider between 7A-7P or the covering provider during after hours 7P-7A, please log into the web site [email or web address] and access using universal [place] password for that web site. If you do not have the password, please call the hospital operator.  11/18/2020, 12:17 PM

## 2020-11-18 NOTE — Consult Note (Signed)
   [facility] Inpatient Consult   11/18/2020  [name] 1946/01/24 [id number]  [facility] [ACO] Patient: [name]  Patient is currently active with [facility] for chronic disease management services.  Patient has been engaged by a [facility].  Our community based plan of care has focused on disease management and community resource support.    Plan:  Inpatient Transition Of Care [TOC] team member to make aware that [facility] following. Of note, [facility] Care Management services does not replace or interfere with any services that are needed or arranged by inpatient [facility] care management team.  For additional questions or referrals please contact:  Natividad Brood, RN BSN [facility] Liaison  [phone] business mobile phone Toll free office [phone]  Fax number: [phone] [name]

## 2020-11-18 NOTE — Progress Notes (Signed)
Physical Therapy Treatment Patient Details Name: [name] MRN: [id number] DOB: 1945-08-18 Today's Date: 11/18/2020    History of Present Illness Pt is 75 yo female who presented with shortness of breath and lightheadedness on 11/15/20.  Pt with hx including recent COVID 52, CAD, HF, R breast CA s/p lumpectomy, R renal vein thrombosis, R LE DVT, and CKD.    PT Comments    The pt was seen for continued progression of OOB mobility. Despite pt reports that she does not need AD for ambulation, the pt was able to complete hallway ambulation with improved gait speed, stability, reduced assistance, and reports decreased pain in R hip. The pt was able to complete multiple bouts of hallway ambulation with VSS and no overt LOB this session, was educated on strengthening exercises for LE she can complete outside of PT session. Will continue to benefit from skilled PT acutely and following d/c, per  note from [place] on 9/1, the pt does not demonstrate medical complexity needed for CIR, will work towards d/c home with family support and HHPT as pt is currently mobilizing on minG level.    Follow Up Recommendations  Home health PT;Supervision/Assistance - 24 hour     Equipment Recommendations  Rolling walker with 5" wheels    Recommendations for Other Services       Precautions / Restrictions Precautions Precautions: Fall;Other (comment) Precaution Comments: monitor orthostatic BP Restrictions Weight Bearing Restrictions: No    Mobility  Bed Mobility Overal bed mobility: Needs Assistance             General bed mobility comments: OOB upon entry    Transfers Overall transfer level: Needs assistance Equipment used: None Transfers: Sit to/from Stand Sit to Stand: Min guard         General transfer comment: minG with cues for hand placement on lower to chair. no physical assist. x5 without use of UE for strengthening at end of  session  Ambulation/Gait Ambulation/Gait assistance: Min assist;Min guard Gait Distance (Feet): 60 Feet (+ 80 ft) Assistive device: 1 person hand held assist;Rolling walker (2 wheeled) Gait Pattern/deviations: Decreased stride length;Step-through pattern Gait velocity: 0.3 m/s Gait velocity interpretation: <1.31 ft/sec, indicative of household ambulator General Gait Details: pt stating she does not need DME, reaching for UE support through [facility] and furniture/rails through entire hallway ambulation. trialed with RW and pt with improved speed, stability, and decreased R hip pain. no LOB with BUE support      Balance Overall balance assessment: Needs assistance Sitting-balance support: No upper extremity supported;Feet supported Sitting balance-Leahy Scale: Good     Standing balance support: No upper extremity supported;During functional activity Standing balance-Leahy Scale: Fair Standing balance comment: pt able to static stand without UE support, improved stability for dynamic mobility with UE support                            Cognition Arousal/Alertness: Awake/alert Behavior During Therapy: Flat affect Overall Cognitive Status: Impaired/Different from baseline Area of Impairment: Following commands;Memory;Safety/judgement;Problem solving;Awareness                     Memory: Decreased short-term memory Following Commands: Follows one step commands consistently;Follows one step commands with increased time;Follows multi-step commands inconsistently Safety/Judgement: Decreased awareness of safety;Decreased awareness of deficits Awareness: Emergent Problem Solving: Slow processing;Requires verbal cues General Comments: pt able to follow all commands, decreased insight to needs for assist as pt stating  she does not need DME for ambulation but seeks BUE support during ambulation. able to follow commands with increased time.      Exercises General Exercises -  Lower Extremity Long Arc Quad: AROM;10 reps;Seated;Other (comment) (legs crossed for increased resistance) Heel Raises: AROM;Both;10 reps;Seated Other Exercises Other Exercises: sit-stand x5 from recliner without UE support. increased time to power up    General Comments General comments (skin integrity, edema, etc.): VSS on RA. BP stable with map 70s through entire session      Pertinent Vitals/Pain Pain Assessment: No/denies pain     PT Goals (current goals can now be found in the care plan section) Acute Rehab PT Goals Patient Stated Goal: none stated, pt with flat affect PT Goal Formulation: With patient Time For Goal Achievement: 11/30/20 Potential to Achieve Goals: Good Progress towards PT goals: Progressing toward goals    Frequency    Min 3X/week      PT Plan Discharge plan needs to be updated       AM-PAC PT "6 Clicks" Mobility   Outcome Measure  Help needed turning from your back to your side while in a flat bed without using bedrails?: None Help needed moving from lying on your back to sitting on the side of a flat bed without using bedrails?: A Little Help needed moving to and from a bed to a chair (including a wheelchair)?: A Little Help needed standing up from a chair using your arms (e.g., wheelchair or bedside chair)?: A Little Help needed to walk in hospital room?: A Little Help needed climbing 3-5 steps with a railing? : A Little 6 Click Score: 19    End of Session Equipment Utilized During Treatment: Gait belt Activity Tolerance: Patient tolerated treatment well Patient left: with call bell/phone within reach;in chair;with chair alarm set Nurse Communication: Mobility status PT Visit Diagnosis: Other abnormalities of gait and mobility (R26.89);Muscle weakness (generalized) (M62.81);Repeated falls (R29.6)     Time: 7121-9758 PT Time Calculation (min) (ACUTE ONLY): 26 min  Charges:  $Gait Training: 8-22 mins $Therapeutic Exercise: 8-22  mins                     West Carbo, PT, DPT   Acute Rehabilitation Department Pager #: [phone]   Sandra Cockayne 11/18/2020, 10:22 AM

## 2020-11-18 NOTE — Plan of Care (Signed)
  Problem: Education: Goal: Knowledge of General Education information will improve Description: Including pain rating scale, medication(s)/side effects and non-pharmacologic comfort measures Outcome: Progressing   Problem: Clinical Measurements: Goal: Ability to maintain clinical measurements within normal limits will improve Outcome: Progressing Goal: Will remain free from infection Outcome: Progressing Goal: Respiratory complications will improve Outcome: Progressing Goal: Cardiovascular complication will be avoided Outcome: Progressing   Problem: Activity: Goal: Risk for activity intolerance will decrease Outcome: Progressing   Problem: Safety: Goal: Ability to remain free from injury will improve Outcome: Progressing   Problem: Skin Integrity: Goal: Risk for impaired skin integrity will decrease Outcome: Progressing

## 2020-11-18 NOTE — Plan of Care (Signed)
  Problem: Health Behavior/Discharge Planning: Goal: Ability to manage health-related needs will improve 11/18/2020 0815 by Leonie Man, RN Outcome: Not Progressing 11/17/2020 1957 by Leonie Man, RN Outcome: Not Progressing   Problem: Nutrition: Goal: Adequate nutrition will be maintained 11/18/2020 0815 by Leonie Man, RN Outcome: Not Progressing 11/17/2020 1957 by Leonie Man, RN Outcome: Not Progressing

## 2020-11-18 NOTE — Progress Notes (Signed)
Calorie Count Note  48-hour calorie count ordered. Calorie count was started on 8/31 at breakfast meal.  Diet: regular, thin liquids Supplements: - Boost Breeze po TID - Magic Cup TID with meals  Day 1 - 11/16/20: Breakfast: 50 kcal, 0 grams of protein Lunch: 240 kcal, 0 grams of protein Dinner: 240 kcal, 0 grams of protein  Day 1 total intake: 530 kcal (32% of minimum estimated needs)  0 grams of protein (0% of minimum estimated needs)   Day 2 - 11/17/20: Breakfast: 149 kcal, 2 grams of protein Lunch: 0 kcal, 0 grams of protein Dinner: 283 kcal, 8 grams of protein  Day 2 total intake: 432 kcal (26% of minimum estimated needs)  10 grams of protein (13% of minimum estimated needs)   Day 3 - 11/18/20: Breakfast: 141 kcal, 4 grams of protein Lunch: 115 kcal, 1 grams of protein Supplements: 250 kcal, 9 grams of protein  Day 3 total intake: 506 kcal (31% of minimum estimated needs)  14 grams of protein (19% of minimum estimated needs)  Nutrition Diagnosis: Inadequate oral intake related to poor appetite as evidenced by meal completion < 25%.  Goal: Patient will meet greater than or equal to 90% of their needs.  Intervention:  - Pt does not desire nutrition support. RD to maximize nutrition through oral nutrition supplements and liberalized diet. MD to start remeron. - d/c calorie count - "High-Calorie, High-Protein Nutrition Therapy" handout from the Academy of Nutrition and Dietetics provided to pt/family - Continue Boost Breeze TID - Continue Magic Cup TID   Gustavus Bryant, MS, RD, LDN Inpatient Clinical Dietitian Please see AMiON for contact information.

## 2020-11-18 NOTE — Plan of Care (Signed)

## 2020-11-19 LAB — RENAL FUNCTION PANEL
Albumin: 2.5 g/dL — ABNORMAL LOW (ref 3.5–5.0)
Anion gap: 6 (ref 5–15)
BUN: 26 mg/dL — ABNORMAL HIGH (ref 8–23)
CO2: 22 mmol/L (ref 22–32)
Calcium: 9.1 mg/dL (ref 8.9–10.3)
Chloride: 109 mmol/L (ref 98–111)
Creatinine, Ser: 2.82 mg/dL — ABNORMAL HIGH (ref 0.44–1.00)
GFR, Estimated: 17 mL/min — ABNORMAL LOW (ref >60)
Glucose, Bld: 113 mg/dL — ABNORMAL HIGH (ref 70–99)
Phosphorus: 2.3 mg/dL — ABNORMAL LOW (ref 2.5–4.6)
Potassium: 3.7 mmol/L (ref 3.5–5.1)
Sodium: 137 mmol/L (ref 135–145)

## 2020-11-19 LAB — GLUCOSE, CAPILLARY
Glucose-Capillary: 83 mg/dL (ref 70–99)
Glucose-Capillary: 157 mg/dL — ABNORMAL HIGH (ref 70–99)

## 2020-11-19 MED ORDER — HYDRALAZINE HCL 100 MG PO TABS
50.0000 mg | ORAL_TABLET | Freq: Two times a day (BID) | ORAL | Status: DC
Start: 2020-11-19 — End: 2020-12-22

## 2020-11-19 MED ORDER — APIXABAN 5 MG PO TABS: 5.0000 mg | ORAL_TABLET | Freq: Two times a day (BID) | ORAL | 0 refills | Status: DC

## 2020-11-19 MED ORDER — ONDANSETRON 4 MG PO TBDP: 4.0000 mg | ORAL_TABLET | Freq: Three times a day (TID) | ORAL | 0 refills | Status: DC | PRN

## 2020-11-19 MED ORDER — FUROSEMIDE 40 MG PO TABS
60.0000 mg | ORAL_TABLET | Freq: Every day | ORAL | Status: DC
  Administered 2020-11-19: 60 mg via ORAL
  Filled 2020-11-19: qty 1

## 2020-11-19 MED ORDER — MIRTAZAPINE 7.5 MG PO TABS: 7.5000 mg | ORAL_TABLET | Freq: Every day | ORAL | 0 refills | Status: DC

## 2020-11-19 MED ORDER — APIXABAN 2.5 MG PO TABS: 2.5000 mg | ORAL_TABLET | Freq: Two times a day (BID) | ORAL | 0 refills | Status: DC

## 2020-11-19 MED ORDER — MELATONIN 5 MG PO TABS: 5.0000 mg | ORAL_TABLET | Freq: Every evening | ORAL | 0 refills | Status: DC | PRN

## 2020-11-19 NOTE — Progress Notes (Signed)
Discharged home accompanied by her son, discharged instructions given to pt. Belongings taken home.

## 2020-11-19 NOTE — TOC Transition Note (Addendum)
Transition of Care [facility]) - CM/SW Discharge Note   Patient Details  Name: [name] MRN: [id number] Date of Birth: Nov 21, 1945  Transition of Care [facility]) CM/SW Contact:  [name], RN Phone Number: 11/19/2020, 11:23 AM   Clinical Narrative:    [name] w patient at bedside re dispo plan and needs. Patient has RW and rollator at home, declines need for 3/1. Reviewed that she had [place] in 2021, she would like to start [facility] set up with them, and has no preference for agency thereafter.  LVM w daughter [name]. Spoke w daughter [name], reviewed plan. [name] states that patient lives with her son [name]. Informed her that he is not listed on patient contacts, and said that she will call him and her sister and facilitate the patient's ride home.  Notified Stacie who deferred to [place] [facility] at [employer] LVM with [place] re referral. [place] able to accept for [facility] services     Final next level of care: [place] Barriers to Discharge: No Barriers Identified   Patient Goals and CMS Choice Patient states their goals for this hospitalization and ongoing recovery are:: to return home CMS Medicare.gov Compare Post Acute Care list provided to:: Patient Choice offered to / list presented to : Patient  Discharge Placement                       Discharge Plan and Services                          HH Arranged: PT, OT, Nurse's Aide HH Agency: [place] Date [facility] Agency Contacted: 11/19/20 Time [place]: 72 Representative spoke with at [place]: [place]) Interventions     Readmission Risk Interventions Readmission Risk Prevention Plan 09/04/2020  Transportation Screening Complete  Medication Review Press photographer) Complete  HRI or [place] Complete  SW Recovery Care/Counseling Consult Complete  [place] Not Applicable   Some recent data might be hidden

## 2020-11-19 NOTE — Discharge Summary (Signed)
PATIENT DETAILS Name: [name] Age: 75 y.o. Sex: female Date of Birth: 1945/05/12 MRN: [id number]. Admitting Physician: Jonetta Osgood, MD CMK:[id number], Jori Moll, MD  Admit Date: 11/15/2020 Discharge date: 11/19/2020  Recommendations for Outpatient Follow-up:  Follow up with PCP in 1-2 weeks Please obtain CMP/CBC in one week  Admitted From:  Home  Disposition: Home with home health [place]: Yes  Equipment/Devices: None  Discharge Condition: Stable  CODE STATUS: FULL CODE  Diet recommendation:  Diet Order             Diet general           Diet regular Room service appropriate? Yes; Fluid consistency: Thin  Diet effective now                   Brief Narrative: Patient is a 75 y.o. female with history of CAD, HF PEF, right breast cancer-s/p lumpectomy, right renal vein thrombosis (2019), RLE DVT (06/2020-s/p 3 months of Eliquis), CKD stage IV-recent COVID-19 infection-who presented to the ED with shortness of breath and lightheadedness.  She was subsequently admitted to the hospitalist service for further evaluation and treatment.   Significant events: 8/30>> admit for evaluation of SOB/lightheadedness.   Significant studies: 8/30>> CXR: No mass/consolidation. 8/31>> VQ scan: No PE 8/31>> bilateral lower extremity Doppler: No DVT   Antimicrobial therapy: None   Microbiology data: 8/16>> COVID/influenza PCR: Positive 8/30>> COVID/influenza PCR: Negative   Procedures : None   Consults: [place] [facility]: Shortness of breath: Resolved-no evidence of VTE on VQ scan/Doppler-had some mild lower extremity edema-diuresed with some improvement.  Plan is to change to oral diuretics today.  Evaluated by oncology on 9/1-given the fact that she had 2 episodes of VTE-she has been started on Eliquis.    Lightheadedness: Unclear etiology-has resolved-probably related to perhaps orthostatic mechanism-telemetry  continues to be negative.  Recent echo in April 2022 was with preserved EF.  Continue thigh-high TED stockings.   Orthostatic hypotension: Asymptomatic-see below regarding adjustment of her BP medications.     HFpEF with exacerbation: Likely the cause of SOB on presentation-overall improved-initially on IV diuretics-changed to oral diuretics.  Volume status remained stable.   CKD stage IV: Creatinine fluctuating-but still close to baseline-watch closely-avoid nephrotoxic agents.   Hypokalemia: Repleted.   Multifactorial anemia: Due to CKD-chronic disease-hemoglobin stable.  Follow oncology recommendation-s/p Aranesp and vitamin B12 injection on 9/1.  Resume outpatient vitamin B12 and erythropoietin injections as previous.     DM-2 (A1c 5.21 August 2020): CBGs stable with SSI.  Resume Tradjenta on discharge.   Recent Labs (last 2 labs)        Recent Labs    11/17/20 1623 11/17/20 2136 11/18/20 0621  GLUCAP 118* 123* 96       HTN: BP stable stable-challenging due to orthostatic mechanism-failure to thrive syndrome-we will continue Bystolic at a reduced dose/hydralazine and furosemide-continue to hold Imdur.  Follow with PCP for further optimization.     History of VTE (RLE DVT April 2022/right renal vein thrombosis 2019): Reassessed by oncology on 9/1-after extensive discussion with family-restarted on Eliquis.   History of ?Crohn's disease s/p multiple colectomy: Stable-continue Enterocort   GERD: Continue PPI   History of breast cancer-s/p right lumpectomy: Follows with oncology   History of anxiety/depression: Continue fluoxetine   Unintentional weight loss/failure to thrive syndrome/poor appetite: TSH unremarkable-numerous recent imaging studies/endoscopies were all negative-defer further work-up to the outpatient work-up.  Have started  Remeron-plan is to liberalize diet is much as possible.  Patient not keen on pursuing feeding tube.  Per RN staff today-patient had significant  improvement in consumed a significant amount of food yesterday.   Follow-up with outpatient PCP/oncology-for further ongoing work-up/optimization of her medications.  If patient changes her mind about feeding tube-that can be pursued further in the outpatient setting.    Note-daughter-Sharon updated over the phone on 9/3.  Procedures None  Discharge Diagnoses:  Principal Problem:   Shortness of breath Active Problems:   GERD without esophagitis   Coronary artery disease involving native coronary artery of native heart   Hyperlipidemia associated with type 2 diabetes mellitus (HCC)   SOB (shortness of breath)   Type 2 diabetes mellitus with stage 4 chronic kidney disease, without long-term current use of insulin (HCC)   Chronic diastolic CHF (congestive heart failure) (HCC)   Elevated troponin   CKD (chronic kidney disease), stage IV ([place])   Unintentional weight loss   Discharge Instructions:  Activity:  As tolerated   Discharge Instructions     Diet general   Complete by: As directed    Discharge instructions   Complete by: As directed    Follow with Primary MD  Seward Carol, MD in 1-2 weeks  Follow-up with the oncologist as previously scheduled.  Please get a complete blood count and chemistry panel checked by your Primary MD at your next visit, and again as instructed by your Primary MD.  Get Medicines reviewed and adjusted: Please take all your medications with you for your next visit with your Primary MD  Laboratory/radiological data: Please request your Primary MD to go over all hospital tests and procedure/radiological results at the follow up, please ask your Primary MD to get all Hospital records sent to his/her office.  In some cases, they will be blood work, cultures and biopsy results pending at the time of your discharge. Please request that your primary care M.D. follows up on these results.  Also Note the following: If you experience worsening of your  admission symptoms, develop shortness of breath, life threatening emergency, suicidal or homicidal thoughts you must seek medical attention immediately by calling 911 or calling your MD immediately  if symptoms less severe.  You must read complete instructions/literature along with all the possible adverse reactions/side effects for all the Medicines you take and that have been prescribed to you. Take any new Medicines after you have completely understood and accpet all the possible adverse reactions/side effects.   Do not drive when taking Pain medications or sleeping medications (Benzodaizepines)  Do not take more than prescribed Pain, Sleep and Anxiety Medications. It is not advisable to combine anxiety,sleep and pain medications without talking with your primary care practitioner  Special Instructions: If you have smoked or chewed Tobacco  in the last 2 yrs please stop smoking, stop any regular Alcohol  and or any Recreational drug use.  Wear Seat belts while driving.  Please note: You were cared for by a hospitalist during your hospital stay. Once you are discharged, your primary care physician will handle any further medical issues. Please note that NO REFILLS for any discharge medications will be authorized once you are discharged, as it is imperative that you return to your primary care physician (or establish a relationship with a primary care physician if you do not have one) for your post hospital discharge needs so that they can reassess your need for medications and monitor your lab values.  Continue boost breeze (feeding supplement) 3 times daily   Increase activity slowly   Complete by: As directed       Allergies as of 11/19/2020       Reactions   Percocet [oxycodone-acetaminophen] Other (See Comments)   headaches   Diazepam Other (See Comments)   REACTION: Agitation Other reaction(s): agitation   Haloperidol Lactate Nausea And Vomiting   Lorazepam Nausea Only   Other  reaction(s): nausea   Morphine Sulfate Other (See Comments)   REACTION: Agitation   Propoxyphene Hcl Nausea And Vomiting   Tramadol Hcl Nausea And Vomiting   Anesthetics, Halogenated    Per patient's daughter, she can not tolerate anesthetics.    Etomidate    Heart stopped during anesthesia   Fentanyl    Heart stopped during anesthesia   Haloperidol    Other reaction(s): nausea & vomitin   Morphine Sulfate    Other reaction(s): nausea   Oxycodone-acetaminophen    Other reaction(s): headaches   Propoxyphene    Other reaction(s): nausea & vomiting   Tramadol Hcl    Other reaction(s): nausea & vomitin Other reaction(s): nausea & vomitin   Versed [midazolam]    Heart stopped during anesthesia        Medication List     STOP taking these medications    amLODipine 10 MG tablet Commonly known as: NORVASC   feeding supplement Liqd   isosorbide mononitrate 30 MG 24 hr tablet Commonly known as: IMDUR       TAKE these medications    acetaminophen 500 MG tablet Commonly known as: TYLENOL Take 1,000 mg by mouth every 6 (six) hours as needed for moderate pain or headache.   apixaban 5 MG Tabs tablet Commonly known as: ELIQUIS Take 1 tablet (5 mg total) by mouth 2 (two) times daily for 5 days. Last dose of Eliquis 5 mg on 9/8 evening, from 9/9 start new dose of Eliquis 2.5 mg twice daily. What changed: You were already taking a medication with the same name, and this prescription was added. Make sure you understand how and when to take each.   apixaban 2.5 MG Tabs tablet Commonly known as: Eliquis Take 1 tablet (2.5 mg total) by mouth 2 (two) times daily. Start taking on: November 25, 2020 What changed:  medication strength how much to take additional instructions These instructions start on November 25, 2020. If you are unsure what to do until then, ask your doctor or other care provider.   B-12 COMPLIANCE INJECTION IJ Inject 1 vial as directed every 30 (thirty)  days.   budesonide 3 MG 24 hr capsule Commonly known as: ENTOCORT EC Take 1 capsule by mouth once daily   ergocalciferol 1.25 MG (50000 UT) capsule Commonly known as: VITAMIN D2 Take 1 capsule (50,000 Units total) by mouth once a week.   famotidine 20 MG tablet Commonly known as: PEPCID Take 1 tablet (20 mg total) by mouth at bedtime. What changed: how much to take   FLUoxetine 20 MG capsule Commonly known as: PROZAC Take 20 mg by mouth at bedtime.   furosemide 40 MG tablet Commonly known as: LASIX Take 1 tablet (40 mg total) by mouth daily.   hydrALAZINE 100 MG tablet Commonly known as: APRESOLINE Take 0.5 tablets (50 mg total) by mouth 2 (two) times daily.   linagliptin 5 MG Tabs tablet Commonly known as: TRADJENTA Take 5 mg by mouth daily.   lipase/protease/amylase 36000 UNITS Cpep capsule Commonly known as: CREON Take 36,000 Units  by mouth 3 (three) times daily with meals.   loperamide 2 MG tablet Commonly known as: IMODIUM A-D Take 4 mg by mouth 4 (four) times daily as needed for diarrhea or loose stools.   melatonin 5 MG Tabs Take 1 tablet (5 mg total) by mouth at bedtime as needed (sleep). What changed: how much to take   mirtazapine 7.5 MG tablet Commonly known as: REMERON Take 1 tablet (7.5 mg total) by mouth at bedtime.   nebivolol 10 MG tablet Commonly known as: BYSTOLIC Take 1 tablet (10 mg total) by mouth daily.   nitroGLYCERIN 0.4 MG SL tablet Commonly known as: NITROSTAT DISSOLVE 1 TABLET UNDER THE TONGUE EVERY 5 MINUTES AS  NEEDED FOR CHEST PAIN. MAX  OF 3 TABLETS IN 15 MINUTES. CALL 911 IF PAIN PERSISTS. What changed: See the new instructions.   ondansetron 4 MG disintegrating tablet Commonly known as: Zofran ODT Take 1 tablet (4 mg total) by mouth every 8 (eight) hours as needed for nausea or vomiting.   pantoprazole 40 MG tablet Commonly known as: PROTONIX TAKE 1 TABLET BY MOUTH  DAILY BEFORE BREAKFAST What changed: when to take this    potassium chloride SA 20 MEQ tablet Commonly known as: KLOR-CON Take 20 mEq by mouth daily.   RETACRIT IJ Inject 1 vial as directed every 30 (thirty) days.   sucralfate 1 g tablet Commonly known as: CARAFATE Take 1 g by mouth 4 (four) times daily -  with meals and at bedtime.               Durable Medical Equipment  (From admission, onward)           Start     Ordered   11/18/20 1442  For home use only DME Walker rolling  Once       Question Answer Comment  Walker: With [employer] Wheels   Patient needs a walker to treat with the following condition Physical deconditioning      11/18/20 1442            Follow-up Information     Seward Carol, MD. Schedule an appointment as soon as possible for a visit in 1 week(s).   Specialty: Internal Medicine Contact information: [address] [place] [address] [phone]         Josue Hector, MD Follow up in 1 month(s).   Specialty: Cardiology Contact information: [address] [place] [address] [phone]         Truitt Merle, MD Follow up.   Specialties: Hematology, Oncology Why: keep appt as previous Contact information: [place] [address] [phone]                Allergies  Allergen Reactions   Percocet [Oxycodone-Acetaminophen] Other (See Comments)    headaches   Diazepam Other (See Comments)    REACTION: Agitation Other reaction(s): agitation   Haloperidol Lactate Nausea And Vomiting   Lorazepam Nausea Only    Other reaction(s): nausea   Morphine Sulfate Other (See Comments)    REACTION: Agitation   Propoxyphene Hcl Nausea And Vomiting   Tramadol Hcl Nausea And Vomiting   Anesthetics, Halogenated     Per patient's daughter, she can not tolerate anesthetics.    Etomidate     Heart stopped during anesthesia   Fentanyl     Heart stopped during anesthesia    Haloperidol     Other reaction(s): nausea &  vomitin   Morphine Sulfate     Other reaction(s): nausea   Oxycodone-Acetaminophen     Other reaction(s): headaches   Propoxyphene     Other reaction(s): nausea & vomiting   Tramadol Hcl     Other reaction(s): nausea & vomitin Other reaction(s): nausea & vomitin   Versed [Midazolam]     Heart stopped during anesthesia      Consultations:  Oncology   Other Procedures/Studies: DG Chest 2 View  Result Date: 11/15/2020 CLINICAL DATA:  weakness EXAM: CHEST - 2 VIEW COMPARISON:  None. FINDINGS: Vascular stent overlying the heart. The cardiomediastinal silhouette is within normal limits. No pleural effusion. No pneumothorax. No mass or consolidation. No acute osseous abnormality. Right axillary surgical clips. IMPRESSION: No acute abnormality in the chest. Electronically Signed   By: Albin Felling M.D.   On: 11/15/2020 14:38   NM Pulmonary Perfusion  Result Date: 11/16/2020 CLINICAL DATA:  Nonspecific chest pain EXAM: NUCLEAR MEDICINE PERFUSION LUNG SCAN TECHNIQUE: Perfusion images were obtained in multiple projections after intravenous injection of radiopharmaceutical. Ventilation scans intentionally deferred if perfusion scan and chest x-ray adequate for interpretation during COVID 19 epidemic. RADIOPHARMACEUTICALS:  4.3 mCi Tc-87mMAA IV COMPARISON:  06/29/2020 FINDINGS: Slight irregularity of perfusion in the peripheral aspect of the RIGHT lung unchanged, in a nonsegmental pattern. No segmental or subsegmental perfusion defects identified. IMPRESSION: No evidence of pulmonary embolism. Stable exam versus 06/29/2020 Electronically Signed   By: MLavonia DanaM.D.   On: 11/16/2020 11:33   DG Chest Portable 1 View  Result Date: 11/01/2020 CLINICAL DATA:  Weakness EXAM: PORTABLE CHEST 1 VIEW COMPARISON:  07/20/2020 FINDINGS: Lungs are clear.  No pleural effusion or pneumothorax. The heart is normal in size. Surgical clips along the right lateral chest wall/axilla. Surgical clips in the right  upper abdomen. IMPRESSION: No evidence of acute cardiopulmonary disease. Electronically Signed   By: SJulian HyM.D.   On: 11/01/2020 21:02   VAS [place]LOWER EXTREMITY VENOUS (DVT)  Result Date: 11/16/2020  Lower Venous DVT Study Patient Name:  Zavannah JMartiniqueSTARKES  Date of Exam:   11/16/2020 Medical Rec #: [id number]          Accession #:    [id number] of Birth: 402-Aug-1947          Patient Gender: F Patient Age:   81years [facility]      VAS [place]OWER EXTREMITY VENOUS (DVT) Referring Phys: GInda Merlin--------------------------------------------------------------------------------  Indications: Edema.  Comparison Study: 06/29/20 prior Performing Technologist: MArchie PattenRVS  Examination Guidelines: A complete evaluation includes B-mode imaging, spectral Doppler, color Doppler, and power Doppler as needed of all accessible portions of each vessel. Bilateral testing is considered an integral part of a complete examination. Limited examinations for reoccurring indications may be performed as noted. The reflux portion of the exam is performed with the patient in reverse Trendelenburg.  +---------+---------------+---------+-----------+----------+-------------------+ RIGHT    CompressibilityPhasicitySpontaneityPropertiesThrombus Aging      +---------+---------------+---------+-----------+----------+-------------------+ CFV      Full           Yes      Yes                                      +---------+---------------+---------+-----------+----------+-------------------+ SFJ      Full                                                             +---------+---------------+---------+-----------+----------+-------------------+  FV Prox  Full                                                             +---------+---------------+---------+-----------+----------+-------------------+ FV Mid   Full                                                              +---------+---------------+---------+-----------+----------+-------------------+ FV DistalFull                                                             +---------+---------------+---------+-----------+----------+-------------------+ PFV      Full                                                             +---------+---------------+---------+-----------+----------+-------------------+ POP      Full           Yes      Yes                                      +---------+---------------+---------+-----------+----------+-------------------+ PTV      Full                                                             +---------+---------------+---------+-----------+----------+-------------------+ PERO                                                  Not well visualized +---------+---------------+---------+-----------+----------+-------------------+   +---------+---------------+---------+-----------+----------+-------------------+ LEFT     CompressibilityPhasicitySpontaneityPropertiesThrombus Aging      +---------+---------------+---------+-----------+----------+-------------------+ CFV      Full           Yes      Yes                                      +---------+---------------+---------+-----------+----------+-------------------+ SFJ      Full                                                             +---------+---------------+---------+-----------+----------+-------------------+ FV Prox  Full                                                             +---------+---------------+---------+-----------+----------+-------------------+  FV Mid   Full                                                             +---------+---------------+---------+-----------+----------+-------------------+ FV DistalFull                                                              +---------+---------------+---------+-----------+----------+-------------------+ PFV      Full                                                             +---------+---------------+---------+-----------+----------+-------------------+ POP      Full           Yes      Yes                                      +---------+---------------+---------+-----------+----------+-------------------+ PTV      Full                                                             +---------+---------------+---------+-----------+----------+-------------------+ PERO                                                  Not well visualized +---------+---------------+---------+-----------+----------+-------------------+     Summary: BILATERAL: - No evidence of deep vein thrombosis seen in the lower extremities, bilaterally. -No evidence of popliteal cyst, bilaterally.   *See table(s) above for measurements and observations. Electronically signed by Harold Barban MD on 11/16/2020 at 6:29:59 PM.    Final      TODAY-DAY OF DISCHARGE:  Subjective:   [name] today has no headache,no chest abdominal pain,no new weakness tingling or numbness, feels much better wants to go home today.   Objective:   Blood pressure 120/70, pulse 68, temperature 98.7 F (37.1 C), temperature source Oral, resp. rate 15, height 5' 4"  (1.626 m), weight 59 kg, SpO2 96 %.  Intake/Output Summary (Last 24 hours) at 11/19/2020 1022 Last data filed at 11/19/2020 0639 Gross per 24 hour  Intake 367 ml  Output --  Net 367 ml   Filed Weights   11/15/20 1621 11/15/20 2151 11/19/20 0640  Weight: 65.2 kg 59.1 kg 59 kg    Exam: Awake Alert, Oriented *3, No new F.N deficits, Normal affect Coy.AT,PERRAL Supple Neck,No JVD, No cervical lymphadenopathy appriciated.  Symmetrical Chest wall movement, Good air movement bilaterally, CTAB RRR,No Gallops,Rubs or new Murmurs, No Parasternal Heave +ve B.Sounds, Abd Soft, Non tender, No  organomegaly appriciated, No rebound -guarding  or rigidity. No Cyanosis, Clubbing or edema, No new Rash or bruise   PERTINENT RADIOLOGIC STUDIES: No results found.   PERTINENT LAB RESULTS: CBC: Recent Labs    11/17/20 0042  WBC 7.9  HGB 9.1*  HCT 28.7*  PLT 185   CMET CMP     Component Value Date/Time   NA 137 11/19/2020 0029   NA 144 07/19/2020 1458   NA 140 03/14/2017 0810   K 3.7 11/19/2020 0029   K 3.8 03/14/2017 0810   CL 109 11/19/2020 0029   CO2 22 11/19/2020 0029   CO2 27 03/14/2017 0810   GLUCOSE 113 (H) 11/19/2020 0029   GLUCOSE 177 (H) 03/14/2017 0810   BUN 26 (H) 11/19/2020 0029   BUN 85 (HH) 07/19/2020 1458   BUN 35.6 (H) 03/14/2017 0810   CREATININE 2.82 (H) 11/19/2020 0029   CREATININE 1.84 (H) 05/23/2020 0829   CREATININE 1.8 (H) 03/14/2017 0810   CALCIUM 9.1 11/19/2020 0029   CALCIUM 9.6 03/14/2017 0810   PROT 6.1 (L) 11/16/2020 0038   PROT 7.8 03/14/2017 0810   ALBUMIN 2.5 (L) 11/19/2020 0029   ALBUMIN 3.5 03/14/2017 0810   AST 33 11/16/2020 0038   AST 15 05/23/2020 0829   AST 17 03/14/2017 0810   ALT 35 11/16/2020 0038   ALT 18 05/23/2020 0829   ALT 17 03/14/2017 0810   ALKPHOS 71 11/16/2020 0038   ALKPHOS 61 03/14/2017 0810   BILITOT 0.7 11/16/2020 0038   BILITOT 0.4 05/23/2020 0829   BILITOT 0.28 03/14/2017 0810   GFRNONAA 17 (L) 11/19/2020 0029   GFRNONAA 29 (L) 05/23/2020 0829   GFRNONAA 45 (L) 12/17/2013 1158   GFRAA 21 (L) 12/21/2019 0845   GFRAA 52 (L) 12/17/2013 1158    GFR Estimated Creatinine Clearance: 15.3 mL/min (A) (by C-G formula based on SCr of 2.82 mg/dL (H)). No results for input(s): LIPASE, AMYLASE in the last 72 hours. No results for input(s): CKTOTAL, CKMB, CKMBINDEX, TROPONINI in the last 72 hours. Invalid input(s): POCBNP No results for input(s): DDIMER in the last 72 hours. No results for input(s): HGBA1C in the last 72 hours. No results for input(s): CHOL, HDL, LDLCALC, TRIG, CHOLHDL, LDLDIRECT in the  last 72 hours. No results for input(s): TSH, T4TOTAL, T3FREE, THYROIDAB in the last 72 hours.  Invalid input(s): FREET3 No results for input(s): VITAMINB12, FOLATE, FERRITIN, TIBC, IRON, RETICCTPCT in the last 72 hours. Coags: No results for input(s): INR in the last 72 hours.  Invalid input(s): PT Microbiology: Recent Results (from the past 240 hour(s))  Resp Panel by RT-PCR (Flu A&B, Covid) Nasopharyngeal Swab     Status: None   Collection Time: 11/15/20  6:05 PM   Specimen: Nasopharyngeal Swab; Nasopharyngeal(NP) swabs in vial transport medium  Result Value Ref Range Status   SARS Coronavirus 2 by RT PCR NEGATIVE NEGATIVE Final    Comment: (NOTE) SARS-CoV-2 target nucleic acids are NOT DETECTED.  The SARS-CoV-2 RNA is generally detectable in upper respiratory specimens during the acute phase of infection. The lowest concentration of SARS-CoV-2 viral copies this assay can detect is 138 copies/mL. A negative result does not preclude SARS-Cov-2 infection and should not be used as the sole basis for treatment or other patient management decisions. A negative result may occur with  improper specimen collection/handling, submission of specimen other than nasopharyngeal swab, presence of viral mutation(s) within the areas targeted by this assay, and inadequate number of viral copies(<138 copies/mL). A negative result must be combined with clinical observations,  patient history, and epidemiological information. The expected result is Negative.  Fact Sheet for Patients:  EntrepreneurPulse.com.au  Fact Sheet for Healthcare Providers:  IncredibleEmployment.be  This test is no t yet approved or cleared by the [place] FDA and  has been authorized for detection and/or diagnosis of SARS-CoV-2 by FDA under an Emergency Use Authorization (EUA). This EUA will remain  in effect (meaning this test can be used) for the duration of the COVID-19  declaration under Section 564(b)(1) of the Act, 21 U.S.C.section 360bbb-3(b)(1), unless the authorization is terminated  or revoked sooner.       Influenza A by PCR NEGATIVE NEGATIVE Final   Influenza B by PCR NEGATIVE NEGATIVE Final    Comment: (NOTE) The Xpert Xpress SARS-CoV-2/FLU/RSV plus assay is intended as an aid in the diagnosis of influenza from Nasopharyngeal swab specimens and should not be used as a sole basis for treatment. Nasal washings and aspirates are unacceptable for Xpert Xpress SARS-CoV-2/FLU/RSV testing.  Fact Sheet for Patients: EntrepreneurPulse.com.au  Fact Sheet for Healthcare Providers: IncredibleEmployment.be  This test is not yet approved or cleared by the [place] FDA and has been authorized for detection and/or diagnosis of SARS-CoV-2 by FDA under an Emergency Use Authorization (EUA). This EUA will remain in effect (meaning this test can be used) for the duration of the COVID-19 declaration under Section 564(b)(1) of the Act, 21 U.S.C. section 360bbb-3(b)(1), unless the authorization is terminated or revoked.  Performed at [facility], [place] [address] [place], [place] [address]   MRSA Next Gen by PCR, Nasal     Status: None   Collection Time: 11/15/20  9:59 PM   Specimen: Nasal Mucosa; Nasal Swab  Result Value Ref Range Status   MRSA by PCR Next Gen NOT DETECTED NOT DETECTED Final    Comment: (NOTE) The GeneXpert MRSA Assay (FDA approved for NASAL specimens only), is one component of a comprehensive MRSA colonization surveillance program. It is not intended to diagnose MRSA infection nor to guide or monitor treatment for MRSA infections. Test performance is not FDA approved in patients less than 76 years old. Performed at [facility], [place] [address] [place], [place] [address]     FURTHER DISCHARGE INSTRUCTIONS:  Get Medicines reviewed and adjusted: Please take all your medications  with you for your next visit with your Primary MD  Laboratory/radiological data: Please request your Primary MD to go over all hospital tests and procedure/radiological results at the follow up, please ask your Primary MD to get all Hospital records sent to his/her office.  In some cases, they will be blood work, cultures and biopsy results pending at the time of your discharge. Please request that your primary care M.D. goes through all the records of your hospital data and follows up on these results.  Also Note the following: If you experience worsening of your admission symptoms, develop shortness of breath, life threatening emergency, suicidal or homicidal thoughts you must seek medical attention immediately by calling 911 or calling your MD immediately  if symptoms less severe.  You must read complete instructions/literature along with all the possible adverse reactions/side effects for all the Medicines you take and that have been prescribed to you. Take any new Medicines after you have completely understood and accpet all the possible adverse reactions/side effects.   Do not drive when taking Pain medications or sleeping medications (Benzodaizepines)  Do not take more than prescribed Pain, Sleep and Anxiety Medications. It is not advisable to  combine anxiety,sleep and pain medications without talking with your primary care practitioner  Special Instructions: If you have smoked or chewed Tobacco  in the last 2 yrs please stop smoking, stop any regular Alcohol  and or any Recreational drug use.  Wear Seat belts while driving.  Please note: You were cared for by a hospitalist during your hospital stay. Once you are discharged, your primary care physician will handle any further medical issues. Please note that NO REFILLS for any discharge medications will be authorized once you are discharged, as it is imperative that you return to your primary care physician (or establish a relationship  with a primary care physician if you do not have one) for your post hospital discharge needs so that they can reassess your need for medications and monitor your lab values.  Total Time spent coordinating discharge including counseling, education and face to face time equals 35 minutes.  SignedOren Binet 11/19/2020 10:22 AM

## 2020-11-23 ENCOUNTER — Other Ambulatory Visit: Payer: Self-pay | Admitting: *Deleted

## 2020-11-23 NOTE — Patient Outreach (Signed)
[facility]) Care Management  11/23/2020  [name] [birth year] [id number]   Member was readmitted to hospital 8/30-9/3, noted that she discharged home to son [name]'s home.  Call placed to member, no answer, HIPAA compliant voice message left.  Call placed to daughter [name], confirms member was now living with [name], number not provided.  State she was out currently with member, unable to talk at this time but will call back.  Will await call back, if no call back will follow up within the next 3-4 business days.  [name], [place], MSN [place] [phone]

## 2020-11-28 ENCOUNTER — Ambulatory Visit: Payer: Medicare Other | Admitting: Hematology

## 2020-11-28 ENCOUNTER — Other Ambulatory Visit: Payer: Self-pay | Admitting: *Deleted

## 2020-11-28 NOTE — Patient Outreach (Addendum)
[facility]) Care Management  11/28/2020  [name] 11-17-1945 [id number]   Outreach attempt #2, unsuccessful, HIPAA compliant voice message left.  Will send outreach letter and follow up within the next 3-4 business days.    Update:  Incoming call received from [facility] son [name], state daughter [name] is the best person to speak with regarding member's care.  Call placed to [facility], state member [name] been "ok" since discharge.  Frustrated that member was discharged home after recommendations to have 24 hours supervision and hospital staff being told she would not have.  Family was expecting member to be placed in SNF.  She [name] had a fall since discharge, denies any injuries.  Daughter [name] applied for Medicaid, aware of the wait time for approval.  She is looking into other options while waiting on for approval, unable to pay out of pocket.  This care manager will discuss case with CSW for further insight and suggestions.  Denies any urgent concerns, encouraged to contact this care manager with questions.  Agrees to follow up within the next 2 weeks.   Goals Addressed             This Visit's Progress    [facility]N - Make and Keep All Appointments   On track    Timeframe:  Short-Term Goal Priority:  Medium Start Date:     6/27                        Expected End Date:             9/27  Barriers: Financial Knowledge     - ask family or friend for a ride - keep a calendar with appointment dates    Why is this important?   Part of staying healthy is seeing the doctor for follow-up care.  If you forget your appointments, there are some things you can do to stay on track.    Notes:   6/27 - Conference calls placed to schedule follow up appointments. GI scheduled for 7/1, PCP for 7/12, and cardiology for 8/3.  She will drive self to these appointments  7/20 - Was readmitted to hospital 7/8-7/12.  Followed up with PCP yesterday, will go to cancer center on 7/22, and  have appointment with cardiology on 8/3.  Either her friend or family will provide transportation as she is not currently driving.  6/16 - Follow up appointments reviewed with daughter.  [name] visit tomorrow with [facility] (first since discharge on 9/3), Hematology on 9/16, PCP within the next 2 weeks.       THN - Manage My Diet   On track    Timeframe:  Long-Range Goal Priority:  High Start Date:   6/27                        Expected End Date:           9/27            Barriers: Health Behaviors     - ask for help if I have trouble affording healthy foods - keep healthy snacks on hand - track weight in diary - weigh myself daily    Why is this important?   A healthy diet is important for mental and physical health.  Healthy food helps repair damaged body tissue and maintains strong bones and muscles.  No single food is just right so eating a  variety of proteins, fruits, vegetables and grains is best.  You may need to change what you eat or drink to manage kidney disease.  A dietitian is the best person to guide you.     Notes:   6/27 - Discussed recent weight loss (195 pounds in May, currently 145 pounds).  [name] follow up with GI.  Report appetite is increasing, N/V is decreasing.  Referral to Care Guide for community resources, including food.  7/20 - Report she is now staying with a friend, which [name] helped her concern for food resources as well as ability to cook food during recovery.    9/12 - Member now living with son.  Family helping to manage health care.  Per daughter, appetite [name] increased since being on Mirtazepine.  She continues to be weak, family concerned about her being home alone during the day.        [name], [place], MSN [place] [phone]

## 2020-11-29 DIAGNOSIS — D631 Anemia in chronic kidney disease: Secondary | ICD-10-CM | POA: Diagnosis not present

## 2020-11-29 DIAGNOSIS — Z7901 Long term (current) use of anticoagulants: Secondary | ICD-10-CM | POA: Diagnosis not present

## 2020-11-29 DIAGNOSIS — I5032 Chronic diastolic (congestive) heart failure: Secondary | ICD-10-CM | POA: Diagnosis not present

## 2020-11-29 DIAGNOSIS — F32A Depression, unspecified: Secondary | ICD-10-CM | POA: Diagnosis not present

## 2020-11-29 DIAGNOSIS — R634 Abnormal weight loss: Secondary | ICD-10-CM | POA: Diagnosis not present

## 2020-11-29 DIAGNOSIS — I130 Hypertensive heart and chronic kidney disease with heart failure and stage 1 through stage 4 chronic kidney disease, or unspecified chronic kidney disease: Secondary | ICD-10-CM | POA: Diagnosis not present

## 2020-11-29 DIAGNOSIS — E440 Moderate protein-calorie malnutrition: Secondary | ICD-10-CM | POA: Diagnosis not present

## 2020-11-29 DIAGNOSIS — E1151 Type 2 diabetes mellitus with diabetic peripheral angiopathy without gangrene: Secondary | ICD-10-CM | POA: Diagnosis not present

## 2020-11-29 DIAGNOSIS — N184 Chronic kidney disease, stage 4 (severe): Secondary | ICD-10-CM | POA: Diagnosis not present

## 2020-11-29 DIAGNOSIS — E1140 Type 2 diabetes mellitus with diabetic neuropathy, unspecified: Secondary | ICD-10-CM | POA: Diagnosis not present

## 2020-11-29 DIAGNOSIS — I4510 Unspecified right bundle-branch block: Secondary | ICD-10-CM | POA: Diagnosis not present

## 2020-11-29 DIAGNOSIS — L930 Discoid lupus erythematosus: Secondary | ICD-10-CM | POA: Diagnosis not present

## 2020-11-29 DIAGNOSIS — I2510 Atherosclerotic heart disease of native coronary artery without angina pectoris: Secondary | ICD-10-CM | POA: Diagnosis not present

## 2020-11-29 DIAGNOSIS — E785 Hyperlipidemia, unspecified: Secondary | ICD-10-CM | POA: Diagnosis not present

## 2020-11-29 DIAGNOSIS — M48062 Spinal stenosis, lumbar region with neurogenic claudication: Secondary | ICD-10-CM | POA: Diagnosis not present

## 2020-11-29 DIAGNOSIS — I872 Venous insufficiency (chronic) (peripheral): Secondary | ICD-10-CM | POA: Diagnosis not present

## 2020-11-29 DIAGNOSIS — E1169 Type 2 diabetes mellitus with other specified complication: Secondary | ICD-10-CM | POA: Diagnosis not present

## 2020-11-29 DIAGNOSIS — E1122 Type 2 diabetes mellitus with diabetic chronic kidney disease: Secondary | ICD-10-CM | POA: Diagnosis not present

## 2020-11-29 DIAGNOSIS — M109 Gout, unspecified: Secondary | ICD-10-CM | POA: Diagnosis not present

## 2020-11-29 DIAGNOSIS — N2581 Secondary hyperparathyroidism of renal origin: Secondary | ICD-10-CM | POA: Diagnosis not present

## 2020-11-29 DIAGNOSIS — G8929 Other chronic pain: Secondary | ICD-10-CM | POA: Diagnosis not present

## 2020-11-29 DIAGNOSIS — E538 Deficiency of other specified B group vitamins: Secondary | ICD-10-CM | POA: Diagnosis not present

## 2020-11-30 ENCOUNTER — Other Ambulatory Visit: Payer: Self-pay | Admitting: Gastroenterology

## 2020-11-30 ENCOUNTER — Ambulatory Visit
Admission: RE | Admit: 2020-11-30 | Discharge: 2020-11-30 | Disposition: A | Discharge status: Home / Self Care | Payer: Medicare Other | Source: Ambulatory Visit | Attending: Gastroenterology | Admitting: Gastroenterology

## 2020-11-30 ENCOUNTER — Telehealth: Payer: Self-pay | Admitting: Internal Medicine

## 2020-11-30 ENCOUNTER — Other Ambulatory Visit: Payer: Self-pay | Admitting: Internal Medicine

## 2020-11-30 DIAGNOSIS — R195 Other fecal abnormalities: Secondary | ICD-10-CM

## 2020-11-30 DIAGNOSIS — R634 Abnormal weight loss: Secondary | ICD-10-CM | POA: Diagnosis not present

## 2020-11-30 DIAGNOSIS — K5900 Constipation, unspecified: Secondary | ICD-10-CM | POA: Diagnosis not present

## 2020-11-30 NOTE — Telephone Encounter (Signed)
I spoke with [name] and she thinks Mom saw an [address] this AM. I told her I had just sent in the refill for her budesonide. She is going to investigate and see what's going on. She thinks because Mom was in the hospital and seen by [facility] that they did f/u appointment with them. She will call us back if we need to put her on our books.

## 2020-11-30 NOTE — Telephone Encounter (Signed)
Inbound call from patient daughter [name]. Patient need medication refill for budesonide.

## 2020-12-01 ENCOUNTER — Inpatient Hospital Stay (HOSPITAL_COMMUNITY)
Admission: EM | Admit: 2020-12-01 | Discharge: 2020-12-10 | DRG: 683 | Disposition: A | Discharge status: Home Health | Payer: Medicare Other | Source: Ambulatory Visit | Attending: Internal Medicine | Admitting: Internal Medicine

## 2020-12-01 ENCOUNTER — Emergency Department (HOSPITAL_COMMUNITY): Payer: Medicare Other

## 2020-12-01 ENCOUNTER — Other Ambulatory Visit: Payer: Self-pay

## 2020-12-01 ENCOUNTER — Encounter (HOSPITAL_COMMUNITY): Payer: Self-pay | Admitting: Emergency Medicine

## 2020-12-01 DIAGNOSIS — Z8249 Family history of ischemic heart disease and other diseases of the circulatory system: Secondary | ICD-10-CM

## 2020-12-01 DIAGNOSIS — N184 Chronic kidney disease, stage 4 (severe): Secondary | ICD-10-CM | POA: Diagnosis not present

## 2020-12-01 DIAGNOSIS — Z803 Family history of malignant neoplasm of breast: Secondary | ICD-10-CM

## 2020-12-01 DIAGNOSIS — E872 Acidosis: Secondary | ICD-10-CM | POA: Diagnosis not present

## 2020-12-01 DIAGNOSIS — E119 Type 2 diabetes mellitus without complications: Secondary | ICD-10-CM | POA: Diagnosis present

## 2020-12-01 DIAGNOSIS — K219 Gastro-esophageal reflux disease without esophagitis: Secondary | ICD-10-CM | POA: Diagnosis not present

## 2020-12-01 DIAGNOSIS — R531 Weakness: Secondary | ICD-10-CM

## 2020-12-01 DIAGNOSIS — F32A Depression, unspecified: Secondary | ICD-10-CM | POA: Diagnosis not present

## 2020-12-01 DIAGNOSIS — I130 Hypertensive heart and chronic kidney disease with heart failure and stage 1 through stage 4 chronic kidney disease, or unspecified chronic kidney disease: Secondary | ICD-10-CM | POA: Diagnosis present

## 2020-12-01 DIAGNOSIS — D631 Anemia in chronic kidney disease: Secondary | ICD-10-CM | POA: Diagnosis not present

## 2020-12-01 DIAGNOSIS — I5030 Unspecified diastolic (congestive) heart failure: Secondary | ICD-10-CM

## 2020-12-01 DIAGNOSIS — S0990XA Unspecified injury of head, initial encounter: Secondary | ICD-10-CM | POA: Diagnosis not present

## 2020-12-01 DIAGNOSIS — I5032 Chronic diastolic (congestive) heart failure: Secondary | ICD-10-CM | POA: Diagnosis present

## 2020-12-01 DIAGNOSIS — N1330 Unspecified hydronephrosis: Secondary | ICD-10-CM | POA: Diagnosis not present

## 2020-12-01 DIAGNOSIS — Z884 Allergy status to anesthetic agent status: Secondary | ICD-10-CM

## 2020-12-01 DIAGNOSIS — Z86718 Personal history of other venous thrombosis and embolism: Secondary | ICD-10-CM

## 2020-12-01 DIAGNOSIS — Z955 Presence of coronary angioplasty implant and graft: Secondary | ICD-10-CM

## 2020-12-01 DIAGNOSIS — Z853 Personal history of malignant neoplasm of breast: Secondary | ICD-10-CM

## 2020-12-01 DIAGNOSIS — E1140 Type 2 diabetes mellitus with diabetic neuropathy, unspecified: Secondary | ICD-10-CM | POA: Diagnosis not present

## 2020-12-01 DIAGNOSIS — F411 Generalized anxiety disorder: Secondary | ICD-10-CM | POA: Diagnosis present

## 2020-12-01 DIAGNOSIS — Z923 Personal history of irradiation: Secondary | ICD-10-CM

## 2020-12-01 DIAGNOSIS — E1110 Type 2 diabetes mellitus with ketoacidosis without coma: Secondary | ICD-10-CM | POA: Diagnosis not present

## 2020-12-01 DIAGNOSIS — E876 Hypokalemia: Secondary | ICD-10-CM | POA: Diagnosis not present

## 2020-12-01 DIAGNOSIS — Z885 Allergy status to narcotic agent status: Secondary | ICD-10-CM

## 2020-12-01 DIAGNOSIS — N189 Chronic kidney disease, unspecified: Secondary | ICD-10-CM | POA: Diagnosis present

## 2020-12-01 DIAGNOSIS — K2950 Unspecified chronic gastritis without bleeding: Secondary | ICD-10-CM | POA: Diagnosis not present

## 2020-12-01 DIAGNOSIS — I639 Cerebral infarction, unspecified: Secondary | ICD-10-CM | POA: Diagnosis not present

## 2020-12-01 DIAGNOSIS — Z20822 Contact with and (suspected) exposure to covid-19: Secondary | ICD-10-CM | POA: Diagnosis present

## 2020-12-01 DIAGNOSIS — N2581 Secondary hyperparathyroidism of renal origin: Secondary | ICD-10-CM | POA: Diagnosis present

## 2020-12-01 DIAGNOSIS — I959 Hypotension, unspecified: Secondary | ICD-10-CM | POA: Diagnosis not present

## 2020-12-01 DIAGNOSIS — Z7901 Long term (current) use of anticoagulants: Secondary | ICD-10-CM

## 2020-12-01 DIAGNOSIS — I2510 Atherosclerotic heart disease of native coronary artery without angina pectoris: Secondary | ICD-10-CM | POA: Diagnosis not present

## 2020-12-01 DIAGNOSIS — Z832 Family history of diseases of the blood and blood-forming organs and certain disorders involving the immune mechanism: Secondary | ICD-10-CM

## 2020-12-01 DIAGNOSIS — E1122 Type 2 diabetes mellitus with diabetic chronic kidney disease: Secondary | ICD-10-CM | POA: Diagnosis present

## 2020-12-01 DIAGNOSIS — Z801 Family history of malignant neoplasm of trachea, bronchus and lung: Secondary | ICD-10-CM

## 2020-12-01 DIAGNOSIS — I4510 Unspecified right bundle-branch block: Secondary | ICD-10-CM | POA: Diagnosis not present

## 2020-12-01 DIAGNOSIS — I10 Essential (primary) hypertension: Secondary | ICD-10-CM | POA: Diagnosis not present

## 2020-12-01 DIAGNOSIS — R296 Repeated falls: Secondary | ICD-10-CM | POA: Diagnosis present

## 2020-12-01 DIAGNOSIS — W19XXXA Unspecified fall, initial encounter: Secondary | ICD-10-CM

## 2020-12-01 DIAGNOSIS — Z800 Family history of malignant neoplasm of digestive organs: Secondary | ICD-10-CM

## 2020-12-01 DIAGNOSIS — Z7984 Long term (current) use of oral hypoglycemic drugs: Secondary | ICD-10-CM

## 2020-12-01 DIAGNOSIS — Z888 Allergy status to other drugs, medicaments and biological substances status: Secondary | ICD-10-CM

## 2020-12-01 DIAGNOSIS — E538 Deficiency of other specified B group vitamins: Secondary | ICD-10-CM | POA: Diagnosis present

## 2020-12-01 DIAGNOSIS — Z8616 Personal history of COVID-19: Secondary | ICD-10-CM | POA: Diagnosis not present

## 2020-12-01 DIAGNOSIS — E785 Hyperlipidemia, unspecified: Secondary | ICD-10-CM | POA: Diagnosis not present

## 2020-12-01 DIAGNOSIS — N281 Cyst of kidney, acquired: Secondary | ICD-10-CM | POA: Diagnosis not present

## 2020-12-01 DIAGNOSIS — Z79811 Long term (current) use of aromatase inhibitors: Secondary | ICD-10-CM

## 2020-12-01 DIAGNOSIS — Z8673 Personal history of transient ischemic attack (TIA), and cerebral infarction without residual deficits: Secondary | ICD-10-CM

## 2020-12-01 DIAGNOSIS — Z862 Personal history of diseases of the blood and blood-forming organs and certain disorders involving the immune mechanism: Secondary | ICD-10-CM | POA: Diagnosis present

## 2020-12-01 DIAGNOSIS — M329 Systemic lupus erythematosus, unspecified: Secondary | ICD-10-CM | POA: Diagnosis not present

## 2020-12-01 DIAGNOSIS — I700 Atherosclerosis of aorta: Secondary | ICD-10-CM | POA: Diagnosis not present

## 2020-12-01 DIAGNOSIS — I152 Hypertension secondary to endocrine disorders: Secondary | ICD-10-CM | POA: Diagnosis present

## 2020-12-01 DIAGNOSIS — N179 Acute kidney failure, unspecified: Principal | ICD-10-CM | POA: Diagnosis present

## 2020-12-01 DIAGNOSIS — K449 Diaphragmatic hernia without obstruction or gangrene: Secondary | ICD-10-CM | POA: Diagnosis not present

## 2020-12-01 DIAGNOSIS — E1169 Type 2 diabetes mellitus with other specified complication: Secondary | ICD-10-CM | POA: Diagnosis present

## 2020-12-01 DIAGNOSIS — I5189 Other ill-defined heart diseases: Secondary | ICD-10-CM | POA: Diagnosis not present

## 2020-12-01 DIAGNOSIS — I951 Orthostatic hypotension: Secondary | ICD-10-CM | POA: Diagnosis present

## 2020-12-01 DIAGNOSIS — D649 Anemia, unspecified: Secondary | ICD-10-CM | POA: Diagnosis not present

## 2020-12-01 DIAGNOSIS — Z833 Family history of diabetes mellitus: Secondary | ICD-10-CM

## 2020-12-01 DIAGNOSIS — Z79899 Other long term (current) drug therapy: Secondary | ICD-10-CM

## 2020-12-01 DIAGNOSIS — I6381 Other cerebral infarction due to occlusion or stenosis of small artery: Secondary | ICD-10-CM | POA: Diagnosis not present

## 2020-12-01 DIAGNOSIS — N250 Renal osteodystrophy: Secondary | ICD-10-CM | POA: Diagnosis not present

## 2020-12-01 DIAGNOSIS — M898X9 Other specified disorders of bone, unspecified site: Secondary | ICD-10-CM | POA: Diagnosis present

## 2020-12-01 LAB — CBC WITH DIFFERENTIAL/PLATELET
Abs Immature Granulocytes: 0 10*3/uL (ref 0.00–0.07)
Basophils Absolute: 0 10*3/uL (ref 0.0–0.1)
Basophils Relative: 0 %
Eosinophils Absolute: 0 10*3/uL (ref 0.0–0.5)
Eosinophils Relative: 0 %
HCT: 38.1 % (ref 36.0–46.0)
Hemoglobin: 11.4 g/dL — ABNORMAL LOW (ref 12.0–15.0)
Lymphocytes Relative: 12 %
Lymphs Abs: 0.9 10*3/uL (ref 0.7–4.0)
MCH: 26.6 pg (ref 26.0–34.0)
MCHC: 29.9 g/dL — ABNORMAL LOW (ref 30.0–36.0)
MCV: 89 fL (ref 80.0–100.0)
Monocytes Absolute: 0.4 10*3/uL (ref 0.1–1.0)
Monocytes Relative: 5 %
Neutro Abs: 5.9 10*3/uL (ref 1.7–7.7)
Neutrophils Relative %: 83 %
Platelets: 277 10*3/uL (ref 150–400)
RBC: 4.28 MIL/uL (ref 3.87–5.11)
RDW: 22.1 % — ABNORMAL HIGH (ref 11.5–15.5)
WBC: 7.1 10*3/uL (ref 4.0–10.5)
nRBC: 0 % (ref 0.0–0.2)
nRBC: 0 /100 WBC

## 2020-12-01 LAB — COMPREHENSIVE METABOLIC PANEL
ALT: 14 U/L (ref 0–44)
AST: 14 U/L — ABNORMAL LOW (ref 15–41)
Albumin: 3.2 g/dL — ABNORMAL LOW (ref 3.5–5.0)
Alkaline Phosphatase: 66 U/L (ref 38–126)
Anion gap: 10 (ref 5–15)
BUN: 66 mg/dL — ABNORMAL HIGH (ref 8–23)
CO2: 18 mmol/L — ABNORMAL LOW (ref 22–32)
Calcium: 9.5 mg/dL (ref 8.9–10.3)
Chloride: 109 mmol/L (ref 98–111)
Creatinine, Ser: 6.19 mg/dL — ABNORMAL HIGH (ref 0.44–1.00)
GFR, Estimated: 7 mL/min — ABNORMAL LOW (ref >60)
Glucose, Bld: 116 mg/dL — ABNORMAL HIGH (ref 70–99)
Potassium: 5.1 mmol/L (ref 3.5–5.1)
Sodium: 137 mmol/L (ref 135–145)
Total Bilirubin: 0.6 mg/dL (ref 0.3–1.2)
Total Protein: 7 g/dL (ref 6.5–8.1)

## 2020-12-01 NOTE — ED Triage Notes (Signed)
Pt here from home for fall. Pt was walking backwards, got dizzy and fell backwards, granddaughter caught her, did not hit head. Pt is on eliquis. Pt  c/o R eye pain. Pt's daughter also stated PCP called and told her to come in for elevated kidney functions labs.

## 2020-12-01 NOTE — ED Provider Notes (Signed)
Emergency Medicine Provider Triage Evaluation Note  [name] , a 75 y.o. female  was evaluated in triage.  Pt complains of fall. Family also at bedside states that cr drawn by pcp is 6.9.  Review of Systems  Positive: Fall, abnormal labs Negative: No loc  Physical Exam  BP 120/66 (BP Location: Left Arm)   Pulse 85   Temp 98.3 F (36.8 C) (Oral)   Resp 16   Ht 5' 4"  (1.626 m)   Wt 59 kg   SpO2 100%   BMI 22.33 kg/m  Gen:   Awake, no distress   Resp:  Normal effort  MSK:   Moves extremities without difficulty  Other:  No facial droop, clear speech, perrl, 5/5 strength to the bue/ble  Medical Decision Making  Medically screening exam initiated at 5:59 PM.  Appropriate orders placed.  [name] was informed that the remainder of the evaluation will be completed by another provider, this initial triage assessment does not replace that evaluation, and the importance of remaining in the ED until their evaluation is complete.     Rodney Booze, PA-C 12/01/20 1801    Daleen Bo, MD 12/03/20 1151

## 2020-12-01 NOTE — ED Notes (Signed)
Bladder scanned PT with a result of 144 mL

## 2020-12-02 ENCOUNTER — Inpatient Hospital Stay (HOSPITAL_COMMUNITY): Payer: Medicare Other

## 2020-12-02 ENCOUNTER — Ambulatory Visit: Payer: Medicare Other

## 2020-12-02 ENCOUNTER — Other Ambulatory Visit: Payer: Medicare Other

## 2020-12-02 ENCOUNTER — Observation Stay (HOSPITAL_COMMUNITY): Payer: Medicare Other

## 2020-12-02 ENCOUNTER — Ambulatory Visit: Payer: Medicare Other | Admitting: Hematology

## 2020-12-02 ENCOUNTER — Other Ambulatory Visit: Payer: Self-pay | Admitting: *Deleted

## 2020-12-02 DIAGNOSIS — E876 Hypokalemia: Secondary | ICD-10-CM | POA: Diagnosis not present

## 2020-12-02 DIAGNOSIS — F32A Depression, unspecified: Secondary | ICD-10-CM | POA: Diagnosis present

## 2020-12-02 DIAGNOSIS — I5030 Unspecified diastolic (congestive) heart failure: Secondary | ICD-10-CM | POA: Diagnosis not present

## 2020-12-02 DIAGNOSIS — N281 Cyst of kidney, acquired: Secondary | ICD-10-CM | POA: Diagnosis not present

## 2020-12-02 DIAGNOSIS — I130 Hypertensive heart and chronic kidney disease with heart failure and stage 1 through stage 4 chronic kidney disease, or unspecified chronic kidney disease: Secondary | ICD-10-CM | POA: Diagnosis not present

## 2020-12-02 DIAGNOSIS — I5032 Chronic diastolic (congestive) heart failure: Secondary | ICD-10-CM | POA: Diagnosis present

## 2020-12-02 DIAGNOSIS — I4510 Unspecified right bundle-branch block: Secondary | ICD-10-CM | POA: Diagnosis present

## 2020-12-02 DIAGNOSIS — Z20822 Contact with and (suspected) exposure to covid-19: Secondary | ICD-10-CM | POA: Diagnosis present

## 2020-12-02 DIAGNOSIS — D631 Anemia in chronic kidney disease: Secondary | ICD-10-CM | POA: Diagnosis present

## 2020-12-02 DIAGNOSIS — K219 Gastro-esophageal reflux disease without esophagitis: Secondary | ICD-10-CM | POA: Diagnosis present

## 2020-12-02 DIAGNOSIS — M329 Systemic lupus erythematosus, unspecified: Secondary | ICD-10-CM | POA: Diagnosis present

## 2020-12-02 DIAGNOSIS — N250 Renal osteodystrophy: Secondary | ICD-10-CM | POA: Diagnosis not present

## 2020-12-02 DIAGNOSIS — E538 Deficiency of other specified B group vitamins: Secondary | ICD-10-CM | POA: Diagnosis present

## 2020-12-02 DIAGNOSIS — R531 Weakness: Secondary | ICD-10-CM | POA: Diagnosis not present

## 2020-12-02 DIAGNOSIS — N1330 Unspecified hydronephrosis: Secondary | ICD-10-CM | POA: Diagnosis present

## 2020-12-02 DIAGNOSIS — I2510 Atherosclerotic heart disease of native coronary artery without angina pectoris: Secondary | ICD-10-CM | POA: Diagnosis present

## 2020-12-02 DIAGNOSIS — E785 Hyperlipidemia, unspecified: Secondary | ICD-10-CM | POA: Diagnosis present

## 2020-12-02 DIAGNOSIS — F411 Generalized anxiety disorder: Secondary | ICD-10-CM | POA: Diagnosis present

## 2020-12-02 DIAGNOSIS — K2950 Unspecified chronic gastritis without bleeding: Secondary | ICD-10-CM | POA: Diagnosis present

## 2020-12-02 DIAGNOSIS — E872 Acidosis: Secondary | ICD-10-CM | POA: Diagnosis not present

## 2020-12-02 DIAGNOSIS — N179 Acute kidney failure, unspecified: Secondary | ICD-10-CM | POA: Diagnosis not present

## 2020-12-02 DIAGNOSIS — N189 Chronic kidney disease, unspecified: Secondary | ICD-10-CM | POA: Diagnosis not present

## 2020-12-02 DIAGNOSIS — N184 Chronic kidney disease, stage 4 (severe): Secondary | ICD-10-CM | POA: Diagnosis present

## 2020-12-02 DIAGNOSIS — E1140 Type 2 diabetes mellitus with diabetic neuropathy, unspecified: Secondary | ICD-10-CM | POA: Diagnosis present

## 2020-12-02 DIAGNOSIS — Z8616 Personal history of COVID-19: Secondary | ICD-10-CM | POA: Diagnosis not present

## 2020-12-02 DIAGNOSIS — N2581 Secondary hyperparathyroidism of renal origin: Secondary | ICD-10-CM | POA: Diagnosis present

## 2020-12-02 DIAGNOSIS — E1122 Type 2 diabetes mellitus with diabetic chronic kidney disease: Secondary | ICD-10-CM | POA: Diagnosis present

## 2020-12-02 DIAGNOSIS — E1110 Type 2 diabetes mellitus with ketoacidosis without coma: Secondary | ICD-10-CM | POA: Diagnosis not present

## 2020-12-02 DIAGNOSIS — D649 Anemia, unspecified: Secondary | ICD-10-CM | POA: Diagnosis not present

## 2020-12-02 LAB — URINALYSIS, COMPLETE (UACMP) WITH MICROSCOPIC
Bilirubin Urine: NEGATIVE
Glucose, UA: NEGATIVE mg/dL
Hgb urine dipstick: NEGATIVE
Ketones, ur: NEGATIVE mg/dL
Leukocytes,Ua: NEGATIVE
Nitrite: NEGATIVE
Protein, ur: NEGATIVE mg/dL
Specific Gravity, Urine: 1.009 (ref 1.005–1.030)
pH: 5 (ref 5.0–8.0)

## 2020-12-02 LAB — CBC
HCT: 37.2 % (ref 36.0–46.0)
Hemoglobin: 11.5 g/dL — ABNORMAL LOW (ref 12.0–15.0)
MCH: 26.8 pg (ref 26.0–34.0)
MCHC: 30.9 g/dL (ref 30.0–36.0)
MCV: 86.7 fL (ref 80.0–100.0)
Platelets: 260 10*3/uL (ref 150–400)
RBC: 4.29 MIL/uL (ref 3.87–5.11)
RDW: 21.8 % — ABNORMAL HIGH (ref 11.5–15.5)
WBC: 7.8 10*3/uL (ref 4.0–10.5)
nRBC: 0 % (ref 0.0–0.2)

## 2020-12-02 LAB — BASIC METABOLIC PANEL
Anion gap: 12 (ref 5–15)
BUN: 65 mg/dL — ABNORMAL HIGH (ref 8–23)
CO2: 17 mmol/L — ABNORMAL LOW (ref 22–32)
Calcium: 9.5 mg/dL (ref 8.9–10.3)
Chloride: 108 mmol/L (ref 98–111)
Creatinine, Ser: 5.17 mg/dL — ABNORMAL HIGH (ref 0.44–1.00)
GFR, Estimated: 8 mL/min — ABNORMAL LOW (ref >60)
Glucose, Bld: 108 mg/dL — ABNORMAL HIGH (ref 70–99)
Potassium: 4.9 mmol/L (ref 3.5–5.1)
Sodium: 137 mmol/L (ref 135–145)

## 2020-12-02 LAB — CBG MONITORING, ED
Glucose-Capillary: 108 mg/dL — ABNORMAL HIGH (ref 70–99)
Glucose-Capillary: 88 mg/dL (ref 70–99)
Glucose-Capillary: 103 mg/dL — ABNORMAL HIGH (ref 70–99)

## 2020-12-02 LAB — SARS CORONAVIRUS 2 (TAT 6-24 HRS): SARS Coronavirus 2: NEGATIVE

## 2020-12-02 LAB — GLUCOSE, CAPILLARY: Glucose-Capillary: 118 mg/dL — ABNORMAL HIGH (ref 70–99)

## 2020-12-02 LAB — CREATININE, URINE, RANDOM: Creatinine, Urine: 80.4 mg/dL

## 2020-12-02 LAB — SODIUM, URINE, RANDOM: Sodium, Ur: 69 mmol/L

## 2020-12-02 MED ORDER — PANCRELIPASE (LIP-PROT-AMYL) 36000-114000 UNITS PO CPEP
36000.0000 [IU] | ORAL_CAPSULE | Freq: Three times a day (TID) | ORAL | Status: DC
  Administered 2020-12-02 – 2020-12-10 (×23): 36000 [IU] via ORAL
  Filled 2020-12-02 (×28): qty 1

## 2020-12-02 MED ORDER — ONDANSETRON HCL 4 MG PO TABS: 4.0000 mg | ORAL_TABLET | Freq: Four times a day (QID) | ORAL | Status: DC | PRN

## 2020-12-02 MED ORDER — LACTATED RINGERS IV SOLN: INTRAVENOUS | Status: AC

## 2020-12-02 MED ORDER — PANTOPRAZOLE SODIUM 40 MG PO TBEC
40.0000 mg | DELAYED_RELEASE_TABLET | Freq: Every day | ORAL | Status: DC
  Administered 2020-12-02 – 2020-12-10 (×9): 40 mg via ORAL
  Filled 2020-12-02 (×9): qty 1

## 2020-12-02 MED ORDER — APIXABAN 2.5 MG PO TABS
2.5000 mg | ORAL_TABLET | Freq: Two times a day (BID) | ORAL | Status: DC
  Administered 2020-12-02 – 2020-12-10 (×17): 2.5 mg via ORAL
  Filled 2020-12-02 (×17): qty 1

## 2020-12-02 MED ORDER — ONDANSETRON HCL 4 MG/2ML IJ SOLN: 4.0000 mg | Freq: Four times a day (QID) | INTRAMUSCULAR | Status: DC | PRN

## 2020-12-02 MED ORDER — LOPERAMIDE HCL 2 MG PO CAPS: 4.0000 mg | ORAL_CAPSULE | Freq: Four times a day (QID) | ORAL | Status: DC | PRN

## 2020-12-02 MED ORDER — SODIUM CHLORIDE 0.9% FLUSH
3.0000 mL | Freq: Two times a day (BID) | INTRAVENOUS | Status: DC
  Administered 2020-12-02 – 2020-12-09 (×14): 3 mL via INTRAVENOUS

## 2020-12-02 MED ORDER — ACETAMINOPHEN 650 MG RE SUPP: 650.0000 mg | Freq: Four times a day (QID) | RECTAL | Status: DC | PRN

## 2020-12-02 MED ORDER — ROSUVASTATIN CALCIUM 20 MG PO TABS
40.0000 mg | ORAL_TABLET | Freq: Every day | ORAL | Status: DC
  Administered 2020-12-02 – 2020-12-10 (×9): 40 mg via ORAL
  Filled 2020-12-02 (×9): qty 2

## 2020-12-02 MED ORDER — BUDESONIDE 3 MG PO CPEP
3.0000 mg | ORAL_CAPSULE | Freq: Every day | ORAL | Status: DC
  Administered 2020-12-03 – 2020-12-10 (×8): 3 mg via ORAL
  Filled 2020-12-02 (×10): qty 1

## 2020-12-02 MED ORDER — INSULIN ASPART 100 UNIT/ML IJ SOLN
0.0000 [IU] | Freq: Three times a day (TID) | INTRAMUSCULAR | Status: DC
  Administered 2020-12-03: 3 [IU] via SUBCUTANEOUS
  Administered 2020-12-05 – 2020-12-09 (×6): 1 [IU] via SUBCUTANEOUS

## 2020-12-02 MED ORDER — MIRTAZAPINE 15 MG PO TABS
7.5000 mg | ORAL_TABLET | Freq: Every day | ORAL | Status: DC
  Administered 2020-12-02 – 2020-12-09 (×9): 7.5 mg via ORAL
  Filled 2020-12-02 (×10): qty 1

## 2020-12-02 MED ORDER — MELATONIN 5 MG PO TABS
5.0000 mg | ORAL_TABLET | Freq: Every evening | ORAL | Status: DC | PRN
  Administered 2020-12-03: 5 mg via ORAL
  Filled 2020-12-02 (×2): qty 1

## 2020-12-02 MED ORDER — ACETAMINOPHEN 325 MG PO TABS
650.0000 mg | ORAL_TABLET | Freq: Four times a day (QID) | ORAL | Status: DC | PRN
  Administered 2020-12-07: 650 mg via ORAL
  Filled 2020-12-02: qty 2

## 2020-12-02 MED ORDER — SODIUM BICARBONATE 650 MG PO TABS
1300.0000 mg | ORAL_TABLET | Freq: Three times a day (TID) | ORAL | Status: DC
  Administered 2020-12-02 – 2020-12-10 (×24): 1300 mg via ORAL
  Filled 2020-12-02 (×24): qty 2

## 2020-12-02 MED ORDER — SUCRALFATE 1 G PO TABS
1.0000 g | ORAL_TABLET | Freq: Three times a day (TID) | ORAL | Status: DC
  Administered 2020-12-02 – 2020-12-10 (×31): 1 g via ORAL
  Filled 2020-12-02 (×32): qty 1

## 2020-12-02 MED ORDER — FLUOXETINE HCL 20 MG PO CAPS
20.0000 mg | ORAL_CAPSULE | Freq: Every day | ORAL | Status: DC
  Administered 2020-12-02 – 2020-12-09 (×9): 20 mg via ORAL
  Filled 2020-12-02 (×9): qty 1

## 2020-12-02 MED ORDER — SODIUM CHLORIDE 0.9 % IV SOLN: Freq: Once | INTRAVENOUS | Status: DC

## 2020-12-02 NOTE — ED Provider Notes (Signed)
[facility] EMERGENCY DEPARTMENT Provider Note   CSN: [id number] Arrival date & time: 12/01/20  1751     History Chief Complaint  Patient presents with   Fall    [name] is a 75 y.o. female.  [name] had a follow-up with her primary care doctor today after being hospitalized earlier this month.  She recently suffered from COVID-19.  During her primary care office visit, it was noted that her renal function had worsened.  She has baseline CKD, stage IV.  However, she has not been eating and drinking well.  Her blood pressure was also 80 systolic.  She was directed to the emergency department, but before she was able to calm, she had a fall from standing while using her walker.  Her daughter reports that she has been increasingly weak over the past few days.  She woke today, and stated that she was going to die.  The history is provided by the patient and a relative (daughter).  Fall This is a new problem. The current episode started 3 to 5 hours ago. Episode frequency: once. The problem has been resolved. Pertinent negatives include no chest pain, no abdominal pain, no headaches and no shortness of breath. Nothing aggravates the symptoms. Nothing relieves the symptoms. She has tried nothing for the symptoms. The treatment provided no relief.      Past Medical History:  Diagnosis Date   Abdominal discomfort    Chronic N/V/D. Presumptive dx Crohn's dx per elevated p ANCA. Failed Entocort and Pentasa. Sep 2003 - ileocolectomy c anastomosis per Dr Deon Pilling 2/2 adhesions - path was hegative for Crohns. EGD, Sm bowel follow through (11/03), and an eteroclysis (10/03) were unrevealing. Cuases hypomag and hypocalcemia.   Adnexal mass 8/03   s/p lap BSO (R ovarian fibroma) & lysis of adhesions   Allergy    Seasonal   Anemia    Multifactorial. Baseline HgB 10-11 ish. B12 def - 150 in 3/10. Fe Def - ferritin 35 3/10. Both are being repleted.   Breast cancer  ([place]) 03/16/13   right, 5 o'clock   CAD (coronary artery disease) 1996   1996 - PTCA and angioplasty diagonal branch. 2000 - Rotoblator & angiopllasty of diagonal. 2006 - subendocardial AMI, DES to proximal LAD.Marland Kitchen Also had 90% stenosis in distal apical LAD. EF 55 with apical hypokinesis. Indefinite ASA and Plavix.   CHF (congestive heart failure) (HCC)    Chronic kidney disease    Chronic renal insuff baseline Cr 1.2 - 1.4 ish.   Chronic pain    CT 10/10 = Spinal stenosis L2 - S1.   Diabetes mellitus    Insulin dependent   Gout    Hx of radiation therapy 06/02/13- 07/16/13   right rbeast 4500 cGy 25 sessions, right breast boost 1600 cGy in 8 sessions   Hyperlipidemia    Managed with both a statin and Welchol. Welchol stopped 2014 2/2 cost and started on fenofibrate    Hypertension    2006 B renal arteries patent. 2003 MRA - no RAS. 2003 pheo W/U Dr Hassell Done reportedly negative.   Hypoxia 07/23/2017   Lupus ([place])    Lymphedema of breast    Personal history of radiation therapy 2015   RBBB    Renal vein thrombosis (HCC)    SBO (small bowel obstruction) ([place]) 09/17/2017   Secondary hyperparathyroidism ([place])    Stroke ([place])    Incidental finding MRI 2002 L lacunar infarct   Vitamin B12 deficiency  Vitamin D deficiency    Wears dentures    top    Patient Active Problem List   Diagnosis Date Noted   Shortness of breath 11/15/2020   Elevated troponin 11/15/2020   CKD (chronic kidney disease), stage IV ([place]) 11/15/2020   Unintentional weight loss 11/15/2020   COVID-19 11/02/2020   Malnutrition of moderate degree 09/27/2020   Transaminitis 09/23/2020   Acute renal failure superimposed on stage 4 chronic kidney disease ([place]) 09/03/2020   Chronic diastolic CHF (congestive heart failure) ([place]) 09/03/2020   CHF (congestive heart failure) ([place]) 07/20/2020   Dyspnea 06/28/2020   Chronic diarrhea - responds to budesonide 09/10/2019   Iron deficiency anemia 08/19/2018   Spondylosis of  lumbar spine 12/24/2017   Renal vein thrombosis ([place]) 09/17/2017   Hoarse voice quality 09/09/2017   Low back pain without sciatica 09/09/2017   AKI (acute kidney injury) ([place]) 08/25/2017   Labile blood glucose    Sleep disturbance    Leukocytosis    Essential hypertension    Urinary frequency    Type 2 diabetes mellitus with stage 4 chronic kidney disease, without long-term current use of insulin (HCC)    Benign essential HTN    Stage 2 chronic kidney disease    Dysphagia    Debility    Encephalopathy    Pressure injury of skin 08/04/2017   Pneumonia of left lung due to infectious organism    Diffuse pulmonary alveolar hemorrhage    SOB (shortness of breath) 10/11/2016   Hyperparathyroidism, secondary renal ([place]) 05/02/2016   Gout 04/11/2015   Diabetic neuropathy associated with type 2 diabetes mellitus ([place]) 01/06/2015   SBO (small bowel obstruction) ([place]) 09/15/2013   Breast cancer of lower-inner quadrant of right female breast ([place]) 03/18/2013   Chronic venous insufficiency 01/06/2013   Health care maintenance 05/08/2011   Diabetes mellitus type 2 in obese ([place]) 05/03/2010   Seasonal allergies 05/03/2010   Labile hypertension    Coronary artery disease involving native coronary artery of native heart    Abdominal discomfort    Anemia in chronic kidney disease    History of stroke without residual deficits    Hyperlipidemia associated with type 2 diabetes mellitus (HCC)    Chronic pain    Chronic kidney disease, stage III (moderate) (HCC)    Adnexal mass    GERD without esophagitis 01/19/2009   Spinal stenosis, lumbar region, with neurogenic claudication 01/03/2009    Past Surgical History:  Procedure Laterality Date   ABDOMINAL HYSTERECTOMY     BILATERAL SALPINGOOPHORECTOMY  8/03   Lap BSO (R ovarian fibroma) and adhesion lysis   BIOPSY  09/08/2020   Procedure: BIOPSY;  Surgeon: Ronald Lobo, MD;  Location: [facility];  Service: Endoscopy;;   BOWEL RESECTION   2003   ileocolectomy with anastomosis 2/2 adhesions   BREAST BIOPSY Right 2015   BREAST BIOPSY Right 07/2014   BREAST LUMPECTOMY Right 04/22/2013   BREAST LUMPECTOMY WITH NEEDLE LOCALIZATION AND AXILLARY SENTINEL LYMPH NODE BX Right 04/22/2013   Procedure: BREAST LUMPECTOMY WITH NEEDLE LOCALIZATION AND AXILLARY SENTINEL LYMPH NODE BX;  Surgeon: Stark Klein, MD;  Location: [place];  Service: General;  Laterality: Right;   CARDIAC CATHETERIZATION     2 stents   CHOLECYSTECTOMY     COLONOSCOPY     ESOPHAGOGASTRODUODENOSCOPY N/A 09/08/2020   Procedure: ESOPHAGOGASTRODUODENOSCOPY (EGD);  Surgeon: Ronald Lobo, MD;  Location: [facility];  Service: Endoscopy;  Laterality: N/A;   HEMICOLECTOMY     R sided  hemicolectomy   HERNIA REPAIR     Ventral hernia repair   PTCA  4/06     OB History   No obstetric history on file.     Family History  Problem Relation Age of Onset   Pancreatic cancer Brother 42   Breast cancer Mother 34   Lung cancer Maternal Aunt    Breast cancer Maternal Aunt    Prostate cancer Maternal Uncle    Heart attack Maternal Grandmother    Breast cancer Maternal Grandmother    Colon cancer Maternal Grandmother    Clotting disorder Maternal Grandmother    Diabetes Maternal Grandmother    Kidney failure Maternal Aunt    Hypertension Daughter     Social History   Tobacco Use   Smoking status: Never   Smokeless tobacco: Never  Vaping Use   Vaping Use: Never used  Substance Use Topics   Alcohol use: No    Alcohol/week: 0.0 standard drinks   Drug use: No    Home Medications Prior to Admission medications   Medication Sig Start Date End Date Taking? Authorizing Provider  acetaminophen (TYLENOL) 500 MG tablet Take 1,000 mg by mouth every 6 (six) hours as needed for moderate pain or headache.    [provider]  apixaban (ELIQUIS) 2.5 MG TABS tablet Take 1 tablet (2.5 mg total) by mouth 2 (two) times daily. 11/25/20   Ghimire,  Henreitta Leber, MD  apixaban (ELIQUIS) 5 MG TABS tablet Take 1 tablet (5 mg total) by mouth 2 (two) times daily for 5 days. Last dose of Eliquis 5 mg on 9/8 evening, from 9/9 start new dose of Eliquis 2.5 mg twice daily. 11/19/20 11/24/20  Ghimire, Henreitta Leber, MD  budesonide (ENTOCORT EC) 3 MG 24 hr capsule Take 1 capsule by mouth once daily 11/30/20   Gatha Mayer, MD  Cyanocobalamin (B-12 COMPLIANCE INJECTION IJ) Inject 1 vial as directed every 30 (thirty) days.    [provider]  Epoetin Alfa-epbx (RETACRIT IJ) Inject 1 vial as directed every 30 (thirty) days.    [provider]  ergocalciferol (VITAMIN D2) 1.25 MG (50000 UT) capsule Take 1 capsule (50,000 Units total) by mouth once a week. 08/02/20   Truitt Merle, MD  famotidine (PEPCID) 20 MG tablet Take 1 tablet (20 mg total) by mouth at bedtime. Patient taking differently: Take 40 mg by mouth at bedtime. 09/27/20   Debbe Odea, MD  FLUoxetine (PROZAC) 20 MG capsule Take 20 mg by mouth at bedtime.    [provider]  furosemide (LASIX) 40 MG tablet Take 1 tablet (40 mg total) by mouth daily. 11/05/20   Mariel Aloe, MD  hydrALAZINE (APRESOLINE) 100 MG tablet Take 0.5 tablets (50 mg total) by mouth 2 (two) times daily. 11/19/20   Ghimire, Henreitta Leber, MD  linagliptin (TRADJENTA) 5 MG TABS tablet Take 5 mg by mouth daily.    [provider]  lipase/protease/amylase (CREON) 36000 UNITS CPEP capsule Take 36,000 Units by mouth 3 (three) times daily with meals.    [provider]  loperamide (IMODIUM A-D) 2 MG tablet Take 4 mg by mouth 4 (four) times daily as needed for diarrhea or loose stools.    [provider]  melatonin 5 MG TABS Take 1 tablet (5 mg total) by mouth at bedtime as needed (sleep). 11/19/20   Ghimire, Henreitta Leber, MD  mirtazapine (REMERON) 7.5 MG tablet Take 1 tablet (7.5 mg total) by mouth at bedtime. 11/19/20   Ghimire, [employer]  M, MD  nebivolol (BYSTOLIC) 10 MG tablet Take 1 tablet (10 mg total)  by mouth daily. 06/30/20   Mercy Riding, MD  nitroGLYCERIN (NITROSTAT) 0.4 MG SL tablet DISSOLVE 1 TABLET UNDER THE TONGUE EVERY 5 MINUTES AS  NEEDED FOR CHEST PAIN. MAX  OF 3 TABLETS IN 15 MINUTES. CALL 911 IF PAIN PERSISTS. Patient taking differently: Place 0.4 mg under the tongue every 5 (five) minutes as needed for chest pain. 10/04/20   Jerline Pain, MD  ondansetron (ZOFRAN ODT) 4 MG disintegrating tablet Take 1 tablet (4 mg total) by mouth every 8 (eight) hours as needed for nausea or vomiting. 11/19/20   Ghimire, Henreitta Leber, MD  pantoprazole (PROTONIX) 40 MG tablet TAKE 1 TABLET BY MOUTH  DAILY BEFORE BREAKFAST Patient taking differently: Take 40 mg by mouth daily. 10/24/20   Gatha Mayer, MD  potassium chloride SA (KLOR-CON) 20 MEQ tablet Take 20 mEq by mouth daily. 09/17/20   [provider]  sucralfate (CARAFATE) 1 g tablet Take 1 g by mouth 4 (four) times daily -  with meals and at bedtime.    [provider]    Allergies    Percocet [oxycodone-acetaminophen]; Diazepam; Haloperidol lactate; Lorazepam; Morphine sulfate; Propoxyphene hcl; Tramadol hcl; Anesthetics, halogenated; Etomidate; Fentanyl; Haloperidol; Morphine sulfate; Oxycodone-acetaminophen; Propoxyphene; Tramadol hcl; and Versed [midazolam]  Review of Systems   Review of Systems  Constitutional:  Negative for chills and fever.  HENT:  Negative for ear pain and sore throat.   Eyes:  Negative for pain and visual disturbance.  Respiratory:  Negative for cough and shortness of breath.   Cardiovascular:  Negative for chest pain and palpitations.  Gastrointestinal:  Negative for abdominal pain and vomiting.  Genitourinary:  Negative for dysuria and hematuria.  Musculoskeletal:  Negative for arthralgias and back pain.  Skin:  Negative for color change and rash.  Neurological:  Negative for seizures, syncope and headaches.  All other systems reviewed and are negative.  Physical Exam Updated Vital Signs BP  137/65 (BP Location: Right Arm)   Pulse 73   Temp 97.8 F (36.6 C)   Resp 14   Ht 5' 4"  (1.626 m)   Wt 59 kg   SpO2 99%   BMI 22.33 kg/m   Physical Exam Vitals and nursing note reviewed.  Constitutional:      Appearance: She is well-developed.  HENT:     Head: Normocephalic and atraumatic.     Mouth/Throat:     Mouth: Mucous membranes are dry.  Cardiovascular:     Rate and Rhythm: Normal rate and regular rhythm.     Heart sounds: Normal heart sounds.  Pulmonary:     Effort: Pulmonary effort is normal. No tachypnea.     Breath sounds: Normal breath sounds.  Abdominal:     Palpations: Abdomen is soft.     Tenderness: There is no abdominal tenderness.  Musculoskeletal:     Right lower leg: No edema.     Left lower leg: No edema.  Skin:    General: Skin is warm and dry.  Neurological:     General: No focal deficit present.     Mental Status: She is alert and oriented to person, place, and time.  Psychiatric:        Mood and Affect: Mood normal.        Behavior: Behavior normal.    ED Results / Procedures / Treatments   Labs (all labs ordered are listed, but only abnormal results  are displayed) Labs Reviewed  CBC WITH DIFFERENTIAL/PLATELET - Abnormal; Notable for the following components:      Result Value   Hemoglobin 11.4 (*)    MCHC 29.9 (*)    RDW 22.1 (*)    All other components within normal limits  COMPREHENSIVE METABOLIC PANEL - Abnormal; Notable for the following components:   CO2 18 (*)    Glucose, Bld 116 (*)    BUN 66 (*)    Creatinine, Ser 6.19 (*)    Albumin 3.2 (*)    AST 14 (*)    GFR, Estimated 7 (*)    All other components within normal limits    EKG None  Radiology DG Abd 1 View  Result Date: 12/01/2020 CLINICAL DATA:  Loose stools, constipation intermittent diarrhea as well in a 75 year old female. EXAM: ABDOMEN - 1 VIEW COMPARISON:  June 08, 2020 FINDINGS: Incidentally imaged lung bases are unremarkable. Changes of cholecystectomy.  Bowel gas pattern without signs of obstruction. No free air beneath either RIGHT or LEFT hemidiaphragm. No substantial volume of stool visualized in the colon. No sign of organomegaly. Degenerative changes of the spine greatest in the lower lumbar spine. No gross acute abnormality of regional skeletal structures. IMPRESSION: Nonobstructive bowel gas pattern. No substantial volume of stool visualized in the colon. Considerable stool volume seen on prior exam is no longer evident. Electronically Signed   By: Zetta Bills M.D.   On: 12/01/2020 09:42   CT HEAD WO CONTRAST (5MM)  Result Date: 12/01/2020 CLINICAL DATA:  Fall, head trauma, right eye pain, on Eliquis EXAM: CT HEAD WITHOUT CONTRAST TECHNIQUE: Contiguous axial images were obtained from the base of the skull through the vertex without intravenous contrast. COMPARISON:  08/09/2017 FINDINGS: Brain: No evidence of acute infarction, hemorrhage, hydrocephalus, extra-axial collection or mass lesion/mass effect. Subcortical white matter and periventricular small vessel ischemic changes. Left basal ganglia lacunar infarct. Septum cavum pellucidum et vergae. Vascular: Intracranial atherosclerosis. Skull: Normal. Negative for fracture or focal lesion. Sinuses/Orbits: Partial opacification of the left maxillary sinus. Visualized paranasal sinuses and mastoid air cells are otherwise clear. Other: None. IMPRESSION: No evidence of acute intracranial abnormality. Left basal ganglia lacunar infarct. Small vessel ischemic changes. Electronically Signed   By: Julian Hy M.D.   On: 12/01/2020 19:08    Procedures Procedures   Medications Ordered in ED Medications  0.9 %  sodium chloride infusion (has no administration in time range)    ED Course  I have reviewed the triage vital signs and the nursing notes.  Pertinent labs & imaging results that were available during my care of the patient were reviewed by me and considered in my medical decision making  (see chart for details).  Clinical Course as of 12/02/20 0030  Ludwig Clarks Dec 02, 2020  0027 I spoke with Dr. Posey Pronto who will see the patient. [AW]    Clinical Course User Index [AW] Arnaldo Natal, MD   MDM Rules/Calculators/A&P                           Yanelis [name] presents with acute on chronic kidney disease.  This is possibly secondary to dehydration from overall lack of p.o. intake.  She does have underlying renal pathology from her diabetes and hypertension.  She was given IV hydration and will be admitted to the hospital.  She also presented after a fall from standing height.  Despite her use of anticoagulation, she has no evidence  of intracranial trauma. Final Clinical Impression(s) / ED Diagnoses Final diagnoses:  Acute renal failure, unspecified acute renal failure type ([place])  Fall, initial encounter  Long term current use of anticoagulant    Rx / DC Orders ED Discharge Orders     None        Arnaldo Natal, MD 12/02/20 [phone]

## 2020-12-02 NOTE — Consult Note (Signed)
Nephrology Consult    Assessment/Recommendations:  AKI on CKD4 (improving): Likely secondary to pre-renal injury, possible orthostatic dizziness/hypotension -Underlying CKD likely related to hypertension, DM, cardiorenal syndrome, multiple AKI is due to nausea/vomiting/hypokalemia.  Baseline creatinine is overall fluctuant especially this year.  Her latest creatinine from our office on October 12, 2020 was 2.6, EGFR 19 -mild left hydronephrosis on left kidney on u/s 9/16, will check a ct renal stone study -renal diet -Continue to monitor daily Cr, Dose meds for GFR<15 -Monitor Daily I/Os, Daily weight  -Maintain MAP>65 for optimal renal perfusion.  -Avoid further nephrotoxins including NSAIDS, Morphine.  Unless absolutely necessary, avoid CT with contrast and/or MRI with gadolinium.  Fall, suspecting orthostatic dizziness as a culprit -persistent orthostatic hypotension (just checked) agree with holding home meds, resume one at a time -fortunately she is eating a drinking more today, will give her LR 75cc/hr for about 10hrs, monitor for signs of volume overload  Mild left hydronephrosis -will check a CT renal stone study today  Hypertension: -resume home anti-HTN one at a time  Metabolic acidosis -starting nahco3 133m tid  Anemia due to chronic disease: -Transfuse for Hgb<7 g/dL -receiving esa as an outpatient, Hgb at goal currently  Diabetes Mellitus Type 2 -per primary  CKD-MBD -monitor phos and cal   Recommendations conveyed to primary service.    [facility] 12/02/2020 2:11 PM   _____________________________________________________________________________________   History of Present Illness: [name] is a/an 75y.o. female with a past medical history of CKD, DM, hypertension, CAD status post DES, HFpEF, history of VTE on Eliquis, right breast cancer status postlumpectomy, hyperlipidemia, chronic anemia, GERD [facility] and [facility].  Has had numerous admissions this year for CHF, AKI.  She had a fall on 12/01/2020, labs were done which showed significant worsening kidney function.  Apparently over the last several days she has been having frequent spells of lightheadedness/dizziness when she gets up and is active.  Also has been having poor appetite with chronic loose stools.  Creatinine on presentation was 6.2. Patient and family have reported orthostatic dizziness as an ongoing issue for months. Appetite has been an issue as well for months. Lost about 60 pounds since May. Patient reports feeling better. Family was quite surprised that she ate a lot for lunch. Patient denies any fevers, chills, chest pain, SOB, swelling, changes in vision, dysuria, flank pain, hematuria.   Medications:  Current Facility-Administered Medications  Medication Dose Route Frequency Provider Last Rate Last Admin   acetaminophen (TYLENOL) tablet 650 mg  650 mg Oral Q6H PRN PLenore Cordia MD       Or   acetaminophen (TYLENOL) suppository 650 mg  650 mg Rectal Q6H PRN PLenore Cordia MD       apixaban (ELIQUIS) tablet 2.5 mg  2.5 mg Oral BID PZada FindersR, MD   2.5 mg at 12/02/20 1053   budesonide (ENTOCORT EC) 24 hr capsule 3 mg  3 mg Oral Daily PZada FindersR, MD       FLUoxetine (PROZAC) capsule 20 mg  20 mg Oral QHS PZada FindersR, MD   20 mg at 12/02/20 0242   insulin aspart (novoLOG) injection 0-9 Units  0-9 Units Subcutaneous TID WC Patel, Vishal R, MD       lipase/protease/amylase (CREON) capsule 36,000 Units  36,000 Units Oral TID WC Patel, Vishal R, MD       loperamide (IMODIUM) capsule 4 mg  4  mg Oral QID PRN Lenore Cordia, MD       melatonin tablet 5 mg  5 mg Oral QHS PRN Lenore Cordia, MD       mirtazapine (REMERON) tablet 7.5 mg  7.5 mg Oral QHS Zada Finders R, MD   7.5 mg at 12/02/20 0242   ondansetron (ZOFRAN) tablet 4 mg  4 mg Oral Q6H PRN Lenore Cordia, MD       Or   ondansetron (ZOFRAN) injection 4  mg  4 mg Intravenous Q6H PRN Lenore Cordia, MD       pantoprazole (PROTONIX) EC tablet 40 mg  40 mg Oral QAC breakfast Lenore Cordia, MD   40 mg at 12/02/20 1052   rosuvastatin (CRESTOR) tablet 40 mg  40 mg Oral Daily Lenore Cordia, MD   40 mg at 12/02/20 1053   sodium chloride flush (NS) 0.9 % injection 3 mL  3 mL Intravenous Q12H Patel, Cleaster Corin, MD       sucralfate (CARAFATE) tablet 1 g  1 g Oral TID WC & HS Lenore Cordia, MD       Current Outpatient Medications  Medication Sig Dispense Refill   acetaminophen (TYLENOL) 500 MG tablet Take 1,000 mg by mouth every 6 (six) hours as needed for moderate pain or headache.     apixaban (ELIQUIS) 2.5 MG TABS tablet Take 1 tablet (2.5 mg total) by mouth 2 (two) times daily. 60 tablet 0   Artificial Tear Ointment (DRY EYES OP) Place 1 drop into the right eye 2 (two) times daily as needed (dry eye).     budesonide (ENTOCORT EC) 3 MG 24 hr capsule Take 1 capsule by mouth once daily (Patient taking differently: Take 3 mg by mouth daily.) 30 capsule 0   Cyanocobalamin (B-12 COMPLIANCE INJECTION IJ) Inject 1 vial as directed every 30 (thirty) days.     Epoetin Alfa-epbx (RETACRIT IJ) Inject 1 vial as directed every 30 (thirty) days.     ergocalciferol (VITAMIN D2) 1.25 MG (50000 UT) capsule Take 1 capsule (50,000 Units total) by mouth once a week. 4 capsule 1   famotidine (PEPCID) 20 MG tablet Take 1 tablet (20 mg total) by mouth at bedtime. (Patient taking differently: Take 40 mg by mouth at bedtime.)     FLUoxetine (PROZAC) 20 MG capsule Take 20 mg by mouth at bedtime.     hydrALAZINE (APRESOLINE) 100 MG tablet Take 0.5 tablets (50 mg total) by mouth 2 (two) times daily.     linagliptin (TRADJENTA) 5 MG TABS tablet Take 5 mg by mouth daily.     lipase/protease/amylase (CREON) 36000 UNITS CPEP capsule Take 36,000 Units by mouth 3 (three) times daily with meals.     loperamide (IMODIUM A-D) 2 MG tablet Take 4 mg by mouth 4 (four) times daily as needed  for diarrhea or loose stools.     melatonin 5 MG TABS Take 1 tablet (5 mg total) by mouth at bedtime as needed (sleep).  0   mirtazapine (REMERON) 7.5 MG tablet Take 1 tablet (7.5 mg total) by mouth at bedtime. 30 tablet 0   nebivolol (BYSTOLIC) 10 MG tablet Take 1 tablet (10 mg total) by mouth daily. 90 tablet 0   nitroGLYCERIN (NITROSTAT) 0.4 MG SL tablet DISSOLVE 1 TABLET UNDER THE TONGUE EVERY 5 MINUTES AS  NEEDED FOR CHEST PAIN. MAX  OF 3 TABLETS IN 15 MINUTES. CALL 911 IF PAIN PERSISTS. (Patient taking differently: Place 0.4 mg under  the tongue every 5 (five) minutes as needed for chest pain.) 25 tablet 0   ondansetron (ZOFRAN ODT) 4 MG disintegrating tablet Take 1 tablet (4 mg total) by mouth every 8 (eight) hours as needed for nausea or vomiting. 20 tablet 0   pantoprazole (PROTONIX) 40 MG tablet TAKE 1 TABLET BY MOUTH  DAILY BEFORE BREAKFAST (Patient taking differently: Take 40 mg by mouth daily.) 90 tablet 3   potassium chloride SA (KLOR-CON) 20 MEQ tablet Take 20 mEq by mouth daily.     rosuvastatin (CRESTOR) 40 MG tablet Take 40 mg by mouth daily.     sucralfate (CARAFATE) 1 g tablet Take 1 g by mouth 4 (four) times daily -  with meals and at bedtime.     torsemide (DEMADEX) 20 MG tablet Take 20 mg by mouth daily.     apixaban (ELIQUIS) 5 MG TABS tablet Take 1 tablet (5 mg total) by mouth 2 (two) times daily for 5 days. Last dose of Eliquis 5 mg on 9/8 evening, from 9/9 start new dose of Eliquis 2.5 mg twice daily. (Patient not taking: Reported on 12/02/2020) 11 tablet 0   furosemide (LASIX) 40 MG tablet Take 1 tablet (40 mg total) by mouth daily. (Patient not taking: Reported on 12/02/2020)       ALLERGIES Percocet [oxycodone-acetaminophen]; Diazepam; Haloperidol lactate; Lorazepam; Morphine sulfate; Propoxyphene hcl; Tramadol hcl; Anesthetics, halogenated; Etomidate; Fentanyl; Haloperidol; Morphine sulfate; Oxycodone-acetaminophen; Propoxyphene; Tramadol hcl; and Versed  [midazolam]  MEDICAL HISTORY Past Medical History:  Diagnosis Date   Abdominal discomfort    Chronic N/V/D. Presumptive dx Crohn's dx per elevated p ANCA. Failed Entocort and Pentasa. Sep 2003 - ileocolectomy c anastomosis per Dr Deon Pilling 2/2 adhesions - path was hegative for Crohns. EGD, Sm bowel follow through (11/03), and an eteroclysis (10/03) were unrevealing. Cuases hypomag and hypocalcemia.   Adnexal mass 8/03   s/p lap BSO (R ovarian fibroma) & lysis of adhesions   Allergy    Seasonal   Anemia    Multifactorial. Baseline HgB 10-11 ish. B12 def - 150 in 3/10. Fe Def - ferritin 35 3/10. Both are being repleted.   Breast cancer ([place]) 03/16/13   right, 5 o'clock   CAD (coronary artery disease) 1996   1996 - PTCA and angioplasty diagonal branch. 2000 - Rotoblator & angiopllasty of diagonal. 2006 - subendocardial AMI, DES to proximal LAD.Marland Kitchen Also had 90% stenosis in distal apical LAD. EF 55 with apical hypokinesis. Indefinite ASA and Plavix.   CHF (congestive heart failure) (HCC)    Chronic kidney disease    Chronic renal insuff baseline Cr 1.2 - 1.4 ish.   Chronic pain    CT 10/10 = Spinal stenosis L2 - S1.   Diabetes mellitus    Insulin dependent   Gout    Hx of radiation therapy 06/02/13- 07/16/13   right rbeast 4500 cGy 25 sessions, right breast boost 1600 cGy in 8 sessions   Hyperlipidemia    Managed with both a statin and Welchol. Welchol stopped 2014 2/2 cost and started on fenofibrate    Hypertension    2006 B renal arteries patent. 2003 MRA - no RAS. 2003 pheo W/U Dr Hassell Done reportedly negative.   Hypoxia 07/23/2017   Lupus ([place])    Lymphedema of breast    Personal history of radiation therapy 2015   RBBB    Renal vein thrombosis (HCC)    SBO (small bowel obstruction) ([place]) 09/17/2017   Secondary hyperparathyroidism ([place])    Stroke ([place])  Incidental finding MRI 2002 L lacunar infarct   Vitamin B12 deficiency    Vitamin D deficiency    Wears dentures    top     SOCIAL  HISTORY Social History   Socioeconomic History   Marital status: Legally Separated    Spouse name: Not on file   Number of children: 5   Years of education: Not on file   Highest education level: Not on file  Occupational History   Occupation: retired    Fish farm manager: [employer]  Tobacco Use   Smoking status: Never   Smokeless tobacco: Never  Vaping Use   Vaping Use: Never used  Substance and Sexual Activity   Alcohol use: No    Alcohol/week: 0.0 standard drinks   Drug use: No   Sexual activity: Not on file  Other Topics Concern   Not on file  Social History Narrative   She is separated 2 sons 3 daughters   She has grandchildren and great-grandchildren   She is retired   Never smoker no tobacco now no drugs no alcohol no caffeine   Social Determinants of Radio broadcast assistant Strain: Not on file  Food Insecurity: Food Insecurity Present   Worried About Charity fundraiser in the Last Year: Sometimes true   Arboriculturist in the Last Year: Sometimes true  Transportation Needs: No Transportation Needs   Lack of Transportation (Medical): No   Lack of Transportation (Non-Medical): No  Physical Activity: Not on file  Stress: Not on file  Social Connections: Not on file  Intimate Partner Violence: Not on file     FAMILY HISTORY Family History  Problem Relation Age of Onset   Pancreatic cancer Brother 43   Breast cancer Mother 98   Lung cancer Maternal Aunt    Breast cancer Maternal Aunt    Prostate cancer Maternal Uncle    Heart attack Maternal Grandmother    Breast cancer Maternal Grandmother    Colon cancer Maternal Grandmother    Clotting disorder Maternal Grandmother    Diabetes Maternal Grandmother    Kidney failure Maternal Aunt    Hypertension Daughter      Review of Systems: 12 systems reviewed Otherwise as per HPI, all other systems reviewed and negative  Physical Exam: Vitals:   12/02/20 0917 12/02/20 1159  BP: (!) 147/75 (!)  145/68  Pulse: 75 66  Resp: 16 12  Temp:    SpO2: 100% 100%   Total I/O In: 814.9 [I.V.:814.9] Out: -   Intake/Output Summary (Last 24 hours) at 12/02/2020 1411 Last data filed at 12/02/2020 1053 Gross per 24 hour  Intake 814.86 ml  Output --  Net 814.86 ml   General: well-appearing, no acute distress HEENT: anicteric sclera, oropharynx clear without lesions CV: regular rate, normal rhythm, no murmurs, no gallops, no rubs, no peripheral edema Lungs: clear to auscultation bilaterally, normal work of breathing Abd: soft, non-tender, non-distended Skin: no visible lesions or rashes Psych: alert, engaged, appropriate mood and affect Musculoskeletal: no obvious deformities Neuro: normal speech, no gross focal deficits   Test Results Reviewed Lab Results  Component Value Date   NA 137 12/02/2020   K 4.9 12/02/2020   CL 108 12/02/2020   CO2 17 (L) 12/02/2020   BUN 65 (H) 12/02/2020   CREATININE 5.17 (H) 12/02/2020   GFR 28.35 (L) 10/25/2017   CALCIUM 9.5 12/02/2020   ALBUMIN 3.2 (L) 12/01/2020   PHOS 2.3 (L) 11/19/2020  I have reviewed all relevant outside healthcare records related to the patient's kidney injury.

## 2020-12-02 NOTE — Patient Outreach (Signed)
[facility]) Care Management  12/02/2020  [name] Apr 23, 1945 [id number]   Member's case discussed with multidisciplinary team due to readmissions.  Noted member is currently in the ED, awaiting admission.  Request made for inpatient TOC team to discuss disposition as family didn't feel member was safe at home as well as possible palliative care referral.  Will follow up with member's family pending discharge.  [name], [place], MSN [place] [phone]

## 2020-12-02 NOTE — ED Notes (Signed)
Pt transported to MRI 

## 2020-12-02 NOTE — Progress Notes (Signed)
Inpatient Rehab Admissions Coordinator Note:   Per PT recommendations patient was screened for CIR candidacy by Michel Santee, PT. At this time, pt appears to be a potential candidate for CIR. I will place an order for rehab consult for full assessment, per our protocol.  Please contact me any with questions.Shann Medal, PT, DPT [phone] 12/02/20 4:37 PM

## 2020-12-02 NOTE — Progress Notes (Signed)
PROGRESS NOTE    [name]  YIR:[id number] DOB: 1945/08/07 DOA: 12/01/2020 PCP: Seward Carol, MD      Brief Narrative:  [name] is a 75 y.o. 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  Patient recently admitted for SOB, dizziness, Imdur and amlodipine stopped.  She was diuresed and put back on home diuretics and discharged.  At home, she was weak, had recurrent falls, and then PCP called that her renal function was worse, so she came to the ER.        Assessment & Plan:  Acute on chronic renal failure CKD IV Baseline Cr >2, here greater than 6 mg/dL.  No dyspnea, no uremia/confusion, normal K. - Gentle IV - Check CT renal protocol to rule out stone - Consult Nephrology - UA and renal electrolytes   Orthostatic hypotension - IV fluids and check again in AM, may need binder, TED hose  Chronic diastolic CHF -Hold diuretics  History VTE On low dose due to renal function, probably safe, but reasonable to readdress with Nephrology, patient prior to discharge -Continue low dose apixaban  Anxiety -Continue Prozac, mirtaz  Diabetes - Continue SS corrections  Anemia of CKD Hgb stable  Coronary disease -Continue Crestor  Lacunar infarct on CT Incidental finding.  Timing unclear.  No new focal neurological symptoms. - Obtain MRI brain  Chronic gastritis -Continue PPI, sucralfate, Creon        Disposition: Status is: Inpatient  Remains inpatient appropriate because:Ongoing diagnostic testing needed not appropriate for outpatient work up and IV treatments appropriate due to intensity of illness or inability to take PO  Dispo: The patient is from: Home              Anticipated d/c is to:  TBD              Patient currently is not medically stable to d/c.   Difficult to place patient  No              MDM: This is a no charge note.  For further details, please see H&P by my partner Dr. Posey Pronto from earlier today.  The below labs and imaging reports were reviewed and summarized above.    DVT prophylaxis: apixaban (ELIQUIS) tablet 2.5 mg Start: 12/02/20 1000 apixaban (ELIQUIS) tablet 2.5 mg  Code Status: FULL Family Communication: daughter by phone    Consultants:  Nephrology  Procedures:  CT head US renal CT renal MRI brain  Antimicrobials:     Culture data:             Subjective: Feeling well.  No confusion, no focal weakness, numbness, no speech change.        Objective: Vitals:   12/02/20 1515 12/02/20 1534 12/02/20 1558 12/02/20 1615  BP: (!) 148/75 (!) 158/84  (!) 170/81  Pulse: 78 78 78 80  Resp: 19 16 (!) 21 (!) 22  Temp:    97.6 F (36.4 C)  TempSrc:    Oral  SpO2: 97% 100% 100% 100%  Weight:      Height:        Intake/Output Summary (Last 24 hours) at 12/02/2020 1654 Last data filed at 12/02/2020 1053 Gross per 24 hour  Intake 814.86 ml  Output --  Net 814.86 ml   Filed Weights   12/01/20 1759  Weight: 59 kg  Examination: The patient was seen and examined.      Data Reviewed: I have personally reviewed following labs and imaging studies:  CBC: Recent Labs  Lab 12/01/20 1802 12/02/20 0934  WBC 7.1 7.8  NEUTROABS 5.9  --   HGB 11.4* 11.5*  HCT 38.1 37.2  MCV 89.0 86.7  PLT 277 290   Basic Metabolic Panel: Recent Labs  Lab 12/01/20 1802 12/02/20 0934  NA 137 137  K 5.1 4.9  CL 109 108  CO2 18* 17*  GLUCOSE 116* 108*  BUN 66* 65*  CREATININE 6.19* 5.17*  CALCIUM 9.5 9.5   GFR: Estimated Creatinine Clearance: 8.4 mL/min (A) (by C-G formula based on SCr of 5.17 mg/dL (H)). Liver Function Tests: Recent Labs  Lab 12/01/20 1802  AST 14*  ALT 14  ALKPHOS 66  BILITOT 0.6  PROT 7.0  ALBUMIN 3.2*   No results for input(s): LIPASE, AMYLASE in the last 168 hours. No results for  input(s): AMMONIA in the last 168 hours. Coagulation Profile: No results for input(s): INR, PROTIME in the last 168 hours. Cardiac Enzymes: No results for input(s): CKTOTAL, CKMB, CKMBINDEX, TROPONINI in the last 168 hours. BNP (last 3 results) No results for input(s): PROBNP in the last 8760 hours. HbA1C: No results for input(s): HGBA1C in the last 72 hours. CBG: Recent Labs  Lab 12/02/20 0815 12/02/20 1325  GLUCAP 108* 88   Lipid Profile: No results for input(s): CHOL, HDL, LDLCALC, TRIG, CHOLHDL, LDLDIRECT in the last 72 hours. Thyroid Function Tests: No results for input(s): TSH, T4TOTAL, FREET4, T3FREE, THYROIDAB in the last 72 hours. Anemia Panel: No results for input(s): VITAMINB12, FOLATE, FERRITIN, TIBC, IRON, RETICCTPCT in the last 72 hours. Urine analysis:    Component Value Date/Time   COLORURINE YELLOW 12/02/2020 1316   APPEARANCEUR HAZY (A) 12/02/2020 1316   APPEARANCEUR Clear 08/22/2018 1620   LABSPEC 1.009 12/02/2020 1316   LABSPEC 1.020 03/03/2014 1341   PHURINE 5.0 12/02/2020 1316   GLUCOSEU NEGATIVE 12/02/2020 1316   GLUCOSEU Negative 03/03/2014 1341   HGBUR NEGATIVE 12/02/2020 1316   BILIRUBINUR NEGATIVE 12/02/2020 1316   BILIRUBINUR Negative 08/22/2018 1620   BILIRUBINUR Negative 03/03/2014 1341   KETONESUR NEGATIVE 12/02/2020 1316   PROTEINUR NEGATIVE 12/02/2020 1316   UROBILINOGEN 0.2 03/03/2014 1341   NITRITE NEGATIVE 12/02/2020 1316   LEUKOCYTESUR NEGATIVE 12/02/2020 1316   LEUKOCYTESUR Negative 03/03/2014 1341   Sepsis Labs: @LABRCNTIP (procalcitonin:4,lacticacidven:4)  ) Recent Results (from the past 240 hour(s))  SARS CORONAVIRUS 2 (TAT 6-24 HRS) Nasopharyngeal Nasopharyngeal Swab     Status: None   Collection Time: 12/02/20  4:29 AM   Specimen: Nasopharyngeal Swab  Result Value Ref Range Status   SARS Coronavirus 2 NEGATIVE NEGATIVE Final    Comment: (NOTE) SARS-CoV-2 target nucleic acids are NOT DETECTED.  The SARS-CoV-2 RNA is  generally detectable in upper and lower respiratory specimens during the acute phase of infection. Negative results do not preclude SARS-CoV-2 infection, do not rule out co-infections with other pathogens, and should not be used as the sole basis for treatment or other patient management decisions. Negative results must be combined with clinical observations, patient history, and epidemiological information. The expected result is Negative.  Fact Sheet for Patients: SugarRoll.be  Fact Sheet for Healthcare Providers: https://www.woods-mathews.com/  This test is not yet approved or cleared by the [place] FDA and  has been authorized for detection and/or diagnosis of SARS-CoV-2 by FDA under an Emergency Use Authorization (EUA). This EUA will remain  in  effect (meaning this test can be used) for the duration of the COVID-19 declaration under Se ction 564(b)(1) of the Act, 21 U.S.C. section 360bbb-3(b)(1), unless the authorization is terminated or revoked sooner.  Performed at [facility], [place] [address], [place], [place] [address]          Radiology Studies: CT HEAD WO CONTRAST (5MM)  Result Date: 12/01/2020 CLINICAL DATA:  Fall, head trauma, right eye pain, on Eliquis EXAM: CT HEAD WITHOUT CONTRAST TECHNIQUE: Contiguous axial images were obtained from the base of the skull through the vertex without intravenous contrast. COMPARISON:  08/09/2017 FINDINGS: Brain: No evidence of acute infarction, hemorrhage, hydrocephalus, extra-axial collection or mass lesion/mass effect. Subcortical white matter and periventricular small vessel ischemic changes. Left basal ganglia lacunar infarct. Septum cavum pellucidum et vergae. Vascular: Intracranial atherosclerosis. Skull: Normal. Negative for fracture or focal lesion. Sinuses/Orbits: Partial opacification of the left maxillary sinus. Visualized paranasal sinuses and mastoid air cells are  otherwise clear. Other: None. IMPRESSION: No evidence of acute intracranial abnormality. Left basal ganglia lacunar infarct. Small vessel ischemic changes. Electronically Signed   By: Julian Hy M.D.   On: 12/01/2020 19:08   US RENAL  Result Date: 12/02/2020 CLINICAL DATA:  Chronic kidney disease EXAM: RENAL / URINARY TRACT ULTRASOUND COMPLETE COMPARISON:  09/24/2020 FINDINGS: Right Kidney: Renal measurements: 9.6 x 5.6 x 4.1 cm = volume: 48 mL. Echogenic renal parenchyma. 1.8 x 1.7 x 1.6 cm upper pole cyst, simple. No hydronephrosis. Left Kidney: Renal measurements: 10.2 x 5.2 x 3.9 cm = volume: 110 mL. Echogenic renal parenchyma. 1.6 x 1.4 x 1.7 cm lower pole cyst, simple. Mild hydronephrosis. Bladder: Appears normal for degree of bladder distention. Other: None. IMPRESSION: Echogenic renal parenchyma, suggesting medical renal disease. Bilateral simple renal cysts, as above. Mild left hydronephrosis. Electronically Signed   By: Julian Hy M.D.   On: 12/02/2020 03:08   CT RENAL STONE STUDY  Result Date: 12/02/2020 CLINICAL DATA:  Left hydronephrosis seen on renal ultrasound. EXAM: CT ABDOMEN AND PELVIS WITHOUT CONTRAST TECHNIQUE: Multidetector CT imaging of the abdomen and pelvis was performed following the standard protocol without IV contrast. COMPARISON:  Renal ultrasound from same day. CT abdomen pelvis dated September 03, 2020. FINDINGS: Lower chest: No acute abnormality. Hepatobiliary: No focal liver abnormality is seen. Status post cholecystectomy. No biliary dilatation. Pancreas: Unremarkable. No pancreatic ductal dilatation or surrounding inflammatory changes. Spleen: Normal in size without focal abnormality. Adrenals/Urinary Tract: The adrenal glands are unremarkable. Small bilateral renal cysts again noted. No renal calculi or hydronephrosis. The bladder is unremarkable. Stomach/Bowel: Unchanged small hiatal hernia. The stomach is otherwise within normal limits. Prior right ileocectomy  with anastomosis in the right lower quadrant. No bowel wall thickening, distention, or surrounding inflammatory changes. Vascular/Lymphatic: Aortic atherosclerosis. No enlarged abdominal or pelvic lymph nodes. Reproductive: Status post hysterectomy. No adnexal masses. Other: No free fluid or pneumoperitoneum. Musculoskeletal: No acute or significant osseous findings. IMPRESSION: 1. No acute intra-abdominal process. No hydronephrosis. 2. Aortic Atherosclerosis (ICD10-I70.0). Electronically Signed   By: Titus Dubin M.D.   On: 12/02/2020 16:20        Scheduled Meds:  apixaban  2.5 mg Oral BID   budesonide  3 mg Oral Daily   FLUoxetine  20 mg Oral QHS   insulin aspart  0-9 Units Subcutaneous TID WC   lipase/protease/amylase  36,000 Units Oral TID WC   mirtazapine  7.5 mg Oral QHS   pantoprazole  40 mg Oral QAC breakfast   rosuvastatin  40 mg Oral Daily   sodium bicarbonate  1,300 mg Oral TID   sodium chloride flush  3 mL Intravenous Q12H   sucralfate  1 g Oral TID WC & HS   Continuous Infusions:  lactated ringers       LOS: 0 days    Time spent: 20 minutes    Edwin Dada, MD [facility] 12/02/2020, 4:54 PM     Please page though [place] or Epic secure chat:  For password, contact charge nurse

## 2020-12-02 NOTE — Progress Notes (Signed)
Physical Therapy Evaluation Patient Details Name: [name] MRN: [id number] DOB: September 24, 1945 Today's Date: 12/02/2020  History of Present Illness  Pt is 75 yo female presenting to [facility] ED on 9/15 after a fall. Also found to have ARF. Imaging showed Left basal ganglia  lacunar infarct.  PMH: recent COVID, CAD, CHF, R breast CA s/p lumpectomy, anemia, DM, HTN.  Clinical Impression  Pt presents with the impairments above and problems listed below. Pt's daughter and granddaughter present in room and report they are noticing decline in cognition recently such as pt forgets her rollator leading to falls. Pt supervision to min guard for all mobility tasks using RW; however, pt limited secondary to + orthostatics (see table below). Further mobility deferred. Pt family reporting pt with increased difficulty taking care of herself at home with multiple falls since last admission. Recommend CIR-level therapies upon discharge to address current impairments. We will continue to follow her acutely to promote independence with functional mobility.     12/02/20 1526 12/02/20 1534  Orthostatic Lying   BP- Lying 151/82 158/84  Pulse- Lying 78 79  Orthostatic Sitting  BP- Sitting 114/68  --   Pulse- Sitting 77  --   Orthostatic Standing at 0 minutes  BP- Standing at 0 minutes (!) 84/56  --   Pulse- Standing at 0 minutes 78  --        Recommendations for follow up therapy are one component of a multi-disciplinary discharge planning process, led by the attending physician.  Recommendations may be updated based on patient status, additional functional criteria and insurance authorization.  Follow Up Recommendations CIR;Supervision/Assistance - 24 hour    Equipment Recommendations  Rolling walker with 5" wheels (If pt doesn't already have)    Recommendations for Other Services Speech consult (for cog testing)     Precautions / Restrictions Precautions Precautions: Fall;Other (comment) Precaution  Comments: monitor orthostatic BP. Granddaughter reports 3 falls since previous d/c Restrictions Weight Bearing Restrictions: No      Mobility  Bed Mobility Overal bed mobility: Needs Assistance Bed Mobility: Supine to Sit;Sit to Supine     Supine to sit: Supervision Sit to supine: Supervision   General bed mobility comments: Pt required supervision for safety only. Orthostatic vitals taken, see flowsheet.    Transfers Overall transfer level: Needs assistance Equipment used: Rolling walker (2 wheeled) Transfers: Sit to/from Stand Sit to Stand: Min guard         General transfer comment: Pt required min guard for safety only. Orthostatic vitals taken and were +, see flowsheet. Pt returned to supine. Pt asymptomatic throughout.  Ambulation/Gait             General Gait Details: Deferred secondary to + Orthostatics.  Stairs            Wheelchair Mobility    Modified Rankin (Stroke Patients Only)       Balance Overall balance assessment: Needs assistance Sitting-balance support: Feet unsupported;No upper extremity supported Sitting balance-Leahy Scale: Fair     Standing balance support: Bilateral upper extremity supported;During functional activity Standing balance-Leahy Scale: Poor Standing balance comment: Pt reliant on BUE support on RW                             Pertinent Vitals/Pain Pain Assessment: No/denies pain    Home Living Family/patient expects to be discharged to:: Private residence Living Arrangements: Alone (pt reports son, however, per granddaughter, she lives alone) Available  Help at Discharge: Family;Available PRN/intermittently Type of Home: House Home Access: Level entry     Home Layout: Two level;Bed/bath upstairs Home Equipment: Walker - 4 wheels Additional Comments: Daughter [name] corrected home environment information after initial interview.    Prior Function Level of Independence: Needs assistance    Gait / Transfers Assistance Needed: Using rollator since 2019, but lately has been forgetting about it.  ADL's / Homemaking Assistance Needed: Needs help to get up the stairs. Family helps with ADLs PRN. CNA helps with bathing and feeding (sitter type assistance) in the morning for 2 hours a day 2x week.        Hand Dominance   Dominant Hand: Right    Extremity/Trunk Assessment   Upper Extremity Assessment Upper Extremity Assessment: Defer to OT evaluation    Lower Extremity Assessment Lower Extremity Assessment: Generalized weakness    Cervical / Trunk Assessment Cervical / Trunk Assessment: Normal  Communication   Communication: No difficulties  Cognition Arousal/Alertness: Awake/alert Behavior During Therapy: Flat affect Overall Cognitive Status: Impaired/Different from baseline Area of Impairment: Orientation;Attention;Memory;Following commands;Safety/judgement;Awareness                 Orientation Level: Disoriented to;Time;Situation (Reports it is 2022 but the president is Ida Rogue.) Current Attention Level: Sustained Memory: Decreased short-term memory Following Commands: Follows one step commands consistently;Follows one step commands with increased time Safety/Judgement: Decreased awareness of safety;Decreased awareness of deficits Awareness: Emergent Problem Solving: Slow processing;Requires verbal cues General Comments: Pt able to follow simple commands with increased time and multimodal cuing. Pt reports she does not need assistance with ADLs such as stairs but daughter confirmed she does need assistance.      General Comments General comments (skin integrity, edema, etc.): Daughter [name] and granddaughter in room.    Exercises     Assessment/Plan    PT Assessment Patient needs continued PT services  PT Problem List Decreased strength;Decreased mobility;Decreased safety awareness;Decreased range of motion;Cardiopulmonary status limiting  activity;Decreased balance;Decreased knowledge of use of DME;Decreased cognition;Decreased knowledge of precautions;Decreased activity tolerance       PT Treatment Interventions DME instruction;Therapeutic activities;Gait training;Therapeutic exercise;Patient/family education;Balance training;Functional mobility training;Stair training;Cognitive remediation    PT Goals (Current goals can be found in the Care Plan section)  Acute Rehab PT Goals Patient Stated Goal: none stated, pt with flat affect PT Goal Formulation: With patient/family Time For Goal Achievement: 12/16/20 Potential to Achieve Goals: Good    Frequency Min 3X/week   Barriers to discharge Decreased caregiver support      Co-evaluation               AM-PAC PT "6 Clicks" Mobility  Outcome Measure Help needed turning from your back to your side while in a flat bed without using bedrails?: A Little Help needed moving from lying on your back to sitting on the side of a flat bed without using bedrails?: A Little Help needed moving to and from a bed to a chair (including a wheelchair)?: A Lot Help needed standing up from a chair using your arms (e.g., wheelchair or bedside chair)?: A Little Help needed to walk in hospital room?: A Lot Help needed climbing 3-5 steps with a railing? : Total 6 Click Score: 14    End of Session Equipment Utilized During Treatment: Gait belt Activity Tolerance: Treatment limited secondary to medical complications (Comment) (+ orthostatics) Patient left: in bed;with call bell/phone within reach;with family/visitor present Nurse Communication: Mobility status;Other (comment) (+ orthostatics; patient asymptomatic) PT Visit Diagnosis:  Other abnormalities of gait and mobility (R26.89);Muscle weakness (generalized) (M62.81);Repeated falls (R29.6)    Time: 1614-4324 PT Time Calculation (min) (ACUTE ONLY): 15 min   Charges:   PT Evaluation $PT Eval Moderate Complexity: 1 Mod           Dawayne Cirri, SPT Dawayne Cirri 12/02/2020, 4:08 PM

## 2020-12-02 NOTE — H&P (Signed)
History and Physical    [name] YNW:[id number] DOB: 03-18-1946 DOA: 12/01/2020  PCP: Seward Carol, MD  Patient coming from: Home  I have personally briefly reviewed patient's old medical records in [place]  Chief Complaint: Fall at home, abnormal renal function  HPI: [name] is a 75 y.o. female with medical history significant for CAD s/p DES, HFpEF (EF 55-60% by TTE 07/01/2018), history of VTE on Eliquis, CKD stage IV, right breast cancer s/p lumpectomy, type 2 diabetes, hypertension, hyperlipidemia, anemia of chronic disease and iron/B12 deficiencies, GERD, GAD who presented to the ED for evaluation of worsening renal function and fall at home.  Patient with multiple recent admissions since April of this year.  Most recently admitted from 11/15/2020-12/16/2020 for dyspnea felt related to HFpEF exacerbation.  She is managed with diuresis.  VQ scan was negative for evidence of PE.  She was started on chronic anticoagulation with Eliquis due to previous VTE.  She was also noted to have orthostatic hypotension while in hospital.  Patient returns after having a follow-up visit with her primary care physician earlier today (12/01/2020).  She had lab work performed and was noted to have significantly worsened renal function and was advised to come to the ED for further evaluation.  She was also noted to have a low blood pressure.  Patient states that over the last several days she has been having frequent spells of lightheadedness/dizziness when she is up and active.  She did have 2 falls yesterday that did not result in significant injury.  She says these occurred when she was up with her walker, lost control as a walker rolled away, and fell backwards.  She did not lose consciousness.  She also reports poor appetite and has not been eating well recently.  She denies any nausea, vomiting, abdominal pain, dysuria.  She reports chronic loose stools which is unchanged from  baseline.  She denies any obvious bleeding.  She has been feeling generally weak.  ED Course:  Initial vitals showed BP 120/66, pulse 85, RR 16, temp 98.3 F, SPO2 100% on room air.  Labs significant for BUN 66, creatinine 6.19 (previously BUN 26, creatinine 2.82 on 11/19/2020), sodium 137, potassium 5.1, bicarb 18, serum glucose 116, AST 14, LT 14, alk phos 66, total bilirubin 0.6, WBC 7.1, hemoglobin 11.4, platelets 277,000.  CT head without contrast is negative for evidence of acute intracranial abnormality.  Prior left basal ganglia lacunar infarct noted.  Small vessel ischemic changes reported.  Patient was started on maintenance IV fluids with NS@ 125 mL/hour.  The hospitalist service was consulted to admit for further evaluation and management.  Review of Systems: All systems reviewed and are negative except as documented in history of present illness above.   Past Medical History:  Diagnosis Date   Abdominal discomfort    Chronic N/V/D. Presumptive dx Crohn's dx per elevated p ANCA. Failed Entocort and Pentasa. Sep 2003 - ileocolectomy c anastomosis per Dr Deon Pilling 2/2 adhesions - path was hegative for Crohns. EGD, Sm bowel follow through (11/03), and an eteroclysis (10/03) were unrevealing. Cuases hypomag and hypocalcemia.   Adnexal mass 8/03   s/p lap BSO (R ovarian fibroma) & lysis of adhesions   Allergy    Seasonal   Anemia    Multifactorial. Baseline HgB 10-11 ish. B12 def - 150 in 3/10. Fe Def - ferritin 35 3/10. Both are being repleted.   Breast cancer ([place]) 03/16/13   right, 5 o'clock  CAD (coronary artery disease) 1996   1996 - PTCA and angioplasty diagonal branch. 2000 - Rotoblator & angiopllasty of diagonal. 2006 - subendocardial AMI, DES to proximal LAD.Marland Kitchen Also had 90% stenosis in distal apical LAD. EF 55 with apical hypokinesis. Indefinite ASA and Plavix.   CHF (congestive heart failure) (HCC)    Chronic kidney disease    Chronic renal insuff baseline Cr 1.2 - 1.4 ish.    Chronic pain    CT 10/10 = Spinal stenosis L2 - S1.   Diabetes mellitus    Insulin dependent   Gout    Hx of radiation therapy 06/02/13- 07/16/13   right rbeast 4500 cGy 25 sessions, right breast boost 1600 cGy in 8 sessions   Hyperlipidemia    Managed with both a statin and Welchol. Welchol stopped 2014 2/2 cost and started on fenofibrate    Hypertension    2006 B renal arteries patent. 2003 MRA - no RAS. 2003 pheo W/U Dr Hassell Done reportedly negative.   Hypoxia 07/23/2017   Lupus ([place])    Lymphedema of breast    Personal history of radiation therapy 2015   RBBB    Renal vein thrombosis (HCC)    SBO (small bowel obstruction) ([place]) 09/17/2017   Secondary hyperparathyroidism ([place])    Stroke ([place])    Incidental finding MRI 2002 L lacunar infarct   Vitamin B12 deficiency    Vitamin D deficiency    Wears dentures    top    Past Surgical History:  Procedure Laterality Date   ABDOMINAL HYSTERECTOMY     BILATERAL SALPINGOOPHORECTOMY  8/03   Lap BSO (R ovarian fibroma) and adhesion lysis   BIOPSY  09/08/2020   Procedure: BIOPSY;  Surgeon: Ronald Lobo, MD;  Location: [facility];  Service: Endoscopy;;   BOWEL RESECTION  2003   ileocolectomy with anastomosis 2/2 adhesions   BREAST BIOPSY Right 2015   BREAST BIOPSY Right 07/2014   BREAST LUMPECTOMY Right 04/22/2013   BREAST LUMPECTOMY WITH NEEDLE LOCALIZATION AND AXILLARY SENTINEL LYMPH NODE BX Right 04/22/2013   Procedure: BREAST LUMPECTOMY WITH NEEDLE LOCALIZATION AND AXILLARY SENTINEL LYMPH NODE BX;  Surgeon: Stark Klein, MD;  Location: [facility];  Service: General;  Laterality: Right;   CARDIAC CATHETERIZATION     2 stents   CHOLECYSTECTOMY     COLONOSCOPY     ESOPHAGOGASTRODUODENOSCOPY N/A 09/08/2020   Procedure: ESOPHAGOGASTRODUODENOSCOPY (EGD);  Surgeon: Ronald Lobo, MD;  Location: [facility];  Service: Endoscopy;  Laterality: N/A;   HEMICOLECTOMY     R sided hemicolectomy   HERNIA REPAIR     Ventral  hernia repair   PTCA  4/06    Social History:  reports that she has never smoked. She has never used smokeless tobacco. She reports that she does not drink alcohol and does not use drugs.  Allergies  Allergen Reactions   Percocet [Oxycodone-Acetaminophen] Other (See Comments)    headaches   Diazepam Other (See Comments)    REACTION: Agitation Other reaction(s): agitation   Haloperidol Lactate Nausea And Vomiting   Lorazepam Nausea Only    Other reaction(s): nausea   Morphine Sulfate Other (See Comments)    REACTION: Agitation   Propoxyphene Hcl Nausea And Vomiting   Tramadol Hcl Nausea And Vomiting   Anesthetics, Halogenated     Per patient's daughter, she can not tolerate anesthetics.    Etomidate     Heart stopped during anesthesia   Fentanyl     Heart stopped during anesthesia  Haloperidol     Other reaction(s): nausea & vomitin   Morphine Sulfate     Other reaction(s): nausea   Oxycodone-Acetaminophen     Other reaction(s): headaches   Propoxyphene     Other reaction(s): nausea & vomiting   Tramadol Hcl     Other reaction(s): nausea & vomitin Other reaction(s): nausea & vomitin   Versed [Midazolam]     Heart stopped during anesthesia    Family History  Problem Relation Age of Onset   Pancreatic cancer Brother 67   Breast cancer Mother 69   Lung cancer Maternal Aunt    Breast cancer Maternal Aunt    Prostate cancer Maternal Uncle    Heart attack Maternal Grandmother    Breast cancer Maternal Grandmother    Colon cancer Maternal Grandmother    Clotting disorder Maternal Grandmother    Diabetes Maternal Grandmother    Kidney failure Maternal Aunt    Hypertension Daughter      Prior to Admission medications   Medication Sig Start Date End Date Taking? Authorizing Provider  acetaminophen (TYLENOL) 500 MG tablet Take 1,000 mg by mouth every 6 (six) hours as needed for moderate pain or headache.    [provider]  apixaban (ELIQUIS) 2.5 MG TABS  tablet Take 1 tablet (2.5 mg total) by mouth 2 (two) times daily. 11/25/20   Ghimire, Henreitta Leber, MD  apixaban (ELIQUIS) 5 MG TABS tablet Take 1 tablet (5 mg total) by mouth 2 (two) times daily for 5 days. Last dose of Eliquis 5 mg on 9/8 evening, from 9/9 start new dose of Eliquis 2.5 mg twice daily. 11/19/20 11/24/20  Ghimire, Henreitta Leber, MD  budesonide (ENTOCORT EC) 3 MG 24 hr capsule Take 1 capsule by mouth once daily 11/30/20   Gatha Mayer, MD  Cyanocobalamin (B-12 COMPLIANCE INJECTION IJ) Inject 1 vial as directed every 30 (thirty) days.    [provider]  Epoetin Alfa-epbx (RETACRIT IJ) Inject 1 vial as directed every 30 (thirty) days.    [provider]  ergocalciferol (VITAMIN D2) 1.25 MG (50000 UT) capsule Take 1 capsule (50,000 Units total) by mouth once a week. 08/02/20   Truitt Merle, MD  famotidine (PEPCID) 20 MG tablet Take 1 tablet (20 mg total) by mouth at bedtime. Patient taking differently: Take 40 mg by mouth at bedtime. 09/27/20   Debbe Odea, MD  FLUoxetine (PROZAC) 20 MG capsule Take 20 mg by mouth at bedtime.    [provider]  furosemide (LASIX) 40 MG tablet Take 1 tablet (40 mg total) by mouth daily. 11/05/20   Mariel Aloe, MD  hydrALAZINE (APRESOLINE) 100 MG tablet Take 0.5 tablets (50 mg total) by mouth 2 (two) times daily. 11/19/20   Ghimire, Henreitta Leber, MD  linagliptin (TRADJENTA) 5 MG TABS tablet Take 5 mg by mouth daily.    [provider]  lipase/protease/amylase (CREON) 36000 UNITS CPEP capsule Take 36,000 Units by mouth 3 (three) times daily with meals.    [provider]  loperamide (IMODIUM A-D) 2 MG tablet Take 4 mg by mouth 4 (four) times daily as needed for diarrhea or loose stools.    [provider]  melatonin 5 MG TABS Take 1 tablet (5 mg total) by mouth at bedtime as needed (sleep). 11/19/20   Ghimire, Henreitta Leber, MD  mirtazapine (REMERON) 7.5 MG tablet Take 1 tablet (7.5 mg total) by mouth at bedtime. 11/19/20    Ghimire, Henreitta Leber, MD  nebivolol (BYSTOLIC) 10 MG  tablet Take 1 tablet (10 mg total) by mouth daily. 06/30/20   Mercy Riding, MD  nitroGLYCERIN (NITROSTAT) 0.4 MG SL tablet DISSOLVE 1 TABLET UNDER THE TONGUE EVERY 5 MINUTES AS  NEEDED FOR CHEST PAIN. MAX  OF 3 TABLETS IN 15 MINUTES. CALL 911 IF PAIN PERSISTS. Patient taking differently: Place 0.4 mg under the tongue every 5 (five) minutes as needed for chest pain. 10/04/20   Jerline Pain, MD  ondansetron (ZOFRAN ODT) 4 MG disintegrating tablet Take 1 tablet (4 mg total) by mouth every 8 (eight) hours as needed for nausea or vomiting. 11/19/20   Ghimire, Henreitta Leber, MD  pantoprazole (PROTONIX) 40 MG tablet TAKE 1 TABLET BY MOUTH  DAILY BEFORE BREAKFAST Patient taking differently: Take 40 mg by mouth daily. 10/24/20   Gatha Mayer, MD  potassium chloride SA (KLOR-CON) 20 MEQ tablet Take 20 mEq by mouth daily. 09/17/20   [provider]  sucralfate (CARAFATE) 1 g tablet Take 1 g by mouth 4 (four) times daily -  with meals and at bedtime.    [provider]    Physical Exam: Vitals:   12/01/20 1759 12/01/20 1936 12/01/20 2144 12/01/20 2349  BP:  103/64 113/79 137/65  Pulse:  79 80 73  Resp:  _0 Temp:  98.1 F (36.7 C) 97.8 F (36.6 C)   TempSrc:      SpO2:  100% 100% 99%  Weight: 59 kg     Height: 5' 4" (1.626 m)      Constitutional: Elderly woman resting in bed with head elevated, NAD, calm, comfortable Eyes: PERRL, lids and conjunctivae normal ENMT: Mucous membranes are moist. Posterior pharynx clear of any exudate or lesions.Normal dentition.  Neck: normal, supple, no masses. Respiratory: clear to auscultation bilaterally, no wheezing, no crackles. Normal respiratory effort. No accessory muscle use.  Cardiovascular: Regular rate and rhythm, no murmurs / rubs / gallops. No extremity edema. 2+ pedal pulses. Abdomen: no tenderness, no masses palpated. No hepatosplenomegaly. Bowel sounds positive.  Musculoskeletal:  no clubbing / cyanosis. No joint deformity upper and lower extremities. Good ROM, no contractures. Normal muscle tone.  Skin: no rashes, lesions, ulcers. No induration Neurologic: CN 2-12 grossly intact. Sensation intact. Strength 5/5 in all 4.  Psychiatric: Normal judgment and insight. Alert and oriented x 3. Normal mood.   Labs on Admission: I have personally reviewed following labs and imaging studies  CBC: Recent Labs  Lab 12/01/20 1802  WBC 7.1  NEUTROABS 5.9  HGB 11.4*  HCT 38.1  MCV 89.0  PLT 765   Basic Metabolic Panel: Recent Labs  Lab 12/01/20 1802  NA 137  K 5.1  CL 109  CO2 18*  GLUCOSE 116*  BUN 66*  CREATININE 6.19*  CALCIUM 9.5   GFR: Estimated Creatinine Clearance: 7 mL/min (A) (by C-G formula based on SCr of 6.19 mg/dL (H)). Liver Function Tests: Recent Labs  Lab 12/01/20 1802  AST 14*  ALT 14  ALKPHOS 66  BILITOT 0.6  PROT 7.0  ALBUMIN 3.2*   No results for input(s): LIPASE, AMYLASE in the last 168 hours. No results for input(s): AMMONIA in the last 168 hours. Coagulation Profile: No results for input(s): INR, PROTIME in the last 168 hours. Cardiac Enzymes: No results for input(s): CKTOTAL, CKMB, CKMBINDEX, TROPONINI in the last 168 hours. BNP (last 3 results) No results for input(s): PROBNP in the last 8760 hours. HbA1C: No results for input(s): HGBA1C in the last 72 hours. CBG:  No results for input(s): GLUCAP in the last 168 hours. Lipid Profile: No results for input(s): CHOL, HDL, LDLCALC, TRIG, CHOLHDL, LDLDIRECT in the last 72 hours. Thyroid Function Tests: No results for input(s): TSH, T4TOTAL, FREET4, T3FREE, THYROIDAB in the last 72 hours. Anemia Panel: No results for input(s): VITAMINB12, FOLATE, FERRITIN, TIBC, IRON, RETICCTPCT in the last 72 hours. Urine analysis:    Component Value Date/Time   COLORURINE YELLOW 11/16/2020 1901   APPEARANCEUR HAZY (A) 11/16/2020 1901   APPEARANCEUR Clear 08/22/2018 1620   LABSPEC 1.011  11/16/2020 1901   LABSPEC 1.020 03/03/2014 1341   PHURINE 5.0 11/16/2020 1901   GLUCOSEU NEGATIVE 11/16/2020 1901   GLUCOSEU Negative 03/03/2014 1341   HGBUR NEGATIVE 11/16/2020 1901   BILIRUBINUR NEGATIVE 11/16/2020 1901   BILIRUBINUR Negative 08/22/2018 1620   BILIRUBINUR Negative 03/03/2014 1341   KETONESUR NEGATIVE 11/16/2020 1901   PROTEINUR 30 (A) 11/16/2020 1901   UROBILINOGEN 0.2 03/03/2014 1341   NITRITE NEGATIVE 11/16/2020 1901   LEUKOCYTESUR NEGATIVE 11/16/2020 1901   LEUKOCYTESUR Negative 03/03/2014 1341    Radiological Exams on Admission: DG Abd 1 View  Result Date: 12/01/2020 CLINICAL DATA:  Loose stools, constipation intermittent diarrhea as well in a 75 year old female. EXAM: ABDOMEN - 1 VIEW COMPARISON:  June 08, 2020 FINDINGS: Incidentally imaged lung bases are unremarkable. Changes of cholecystectomy. Bowel gas pattern without signs of obstruction. No free air beneath either RIGHT or LEFT hemidiaphragm. No substantial volume of stool visualized in the colon. No sign of organomegaly. Degenerative changes of the spine greatest in the lower lumbar spine. No gross acute abnormality of regional skeletal structures. IMPRESSION: Nonobstructive bowel gas pattern. No substantial volume of stool visualized in the colon. Considerable stool volume seen on prior exam is no longer evident. Electronically Signed   By: Zetta Bills M.D.   On: 12/01/2020 09:42   CT HEAD WO CONTRAST (5MM)  Result Date: 12/01/2020 CLINICAL DATA:  Fall, head trauma, right eye pain, on Eliquis EXAM: CT HEAD WITHOUT CONTRAST TECHNIQUE: Contiguous axial images were obtained from the base of the skull through the vertex without intravenous contrast. COMPARISON:  08/09/2017 FINDINGS: Brain: No evidence of acute infarction, hemorrhage, hydrocephalus, extra-axial collection or mass lesion/mass effect. Subcortical white matter and periventricular small vessel ischemic changes. Left basal ganglia lacunar infarct.  Septum cavum pellucidum et vergae. Vascular: Intracranial atherosclerosis. Skull: Normal. Negative for fracture or focal lesion. Sinuses/Orbits: Partial opacification of the left maxillary sinus. Visualized paranasal sinuses and mastoid air cells are otherwise clear. Other: None. IMPRESSION: No evidence of acute intracranial abnormality. Left basal ganglia lacunar infarct. Small vessel ischemic changes. Electronically Signed   By: Julian Hy M.D.   On: 12/01/2020 19:08    EKG: Personally reviewed. Sinus rhythm, RBBB.  Similar to prior.  Assessment/Plan Principal Problem:   Acute renal failure superimposed on stage 4 chronic kidney disease (HCC) Active Problems:   GERD without esophagitis   Coronary artery disease involving native coronary artery of native heart   Anemia in chronic kidney disease   Type 2 diabetes mellitus with stage 4 chronic kidney disease, without long-term current use of insulin (HCC)   Essential hypertension   Generalized weakness   (HFpEF) heart failure with preserved ejection fraction ([place])   [name] is a 75 y.o. female with medical history significant for CAD s/p DES, HFpEF (EF 55-60% by TTE 07/01/2018), history of VTE on Eliquis, CKD stage IV, right breast cancer s/p lumpectomy, type 2 diabetes, hypertension, hyperlipidemia, anemia of chronic disease and  iron/B12 deficiencies, GERD, GAD who is admitted with acute renal failure superimposed on CKD stage IV.  Acute renal failure superimposed on stage IV CKD: Creatinine 6.19 compared to recent baseline of 2.3-2.8.  Suspect prerenal related to volume depletion in setting of poor oral intake and diuretic use.  Also noted to be hypotensive as an outpatient. -Started on gentle IV fluid hydration with LR_0  mL/hour x10 hours -Obtain urine studies -Renal ultrasound -Hold torsemide for now -Repeat BMP ~ 10 a.m. after receiving adequate hydration -Strict I/O's -Avoid NSAIDs and other nephrotoxic  agent  Generalized weakness/lightheadedness/falls at home: Suspect she is becoming orthostatic resulting in lightheadedness and falls in addition to deconditioning. -Hold home antihypertensives -Check orthostatic vitals -IV fluid hydration as above -PT/OT eval  HFpEF: Overall suspect volume depletion as above.  Holding torsemide and placed on IV fluid hydration.  Monitor strict I/O's and daily weights.  History of VTE: Continue renal dosed Eliquis 2.5 mg twice daily.  CAD s/p DES: Chronic appears stable.  Denies any chest pain.  Continue Eliquis.  Not currently on statin.  Hypertension: Holding hydralazine, nebivolol, torsemide for now due to suspected orthostasis as above.  Type 2 diabetes: Hold home Tradjenta, place on sensitive SSI.  Last A1c 5.5% on 09/04/2020.  Anemia of chronic disease and iron/B12 deficiencies: Chronic and stable without obvious bleeding.  Continue to monitor.  GERD/chronic gastritis: Continue Entocort, Creon, Carafate, Protonix.  Generalized anxiety disorder/appetite/sleep: Continue Prozac, Remeron, melatonin as needed.  DVT prophylaxis: Eliquis Code Status: Full code, confirmed with patient Family Communication: Discussed with patient, she has discussed with family Disposition Plan: From home, dispo pending clinical progress Consults called: None Level of care: Telemetry Medical Admission status:  Status is: Observation  The patient remains OBS appropriate and will d/c before 2 midnights.  Dispo: The patient is from: Home              Anticipated d/c is to:  Home versus SNF              Patient currently is not medically stable to d/c.  Zada Finders MD [facility]  If 7PM-7AM, please contact night-coverage [email or web address]  12/02/2020, 1:09 AM

## 2020-12-02 NOTE — ED Notes (Signed)
Going to CT

## 2020-12-03 DIAGNOSIS — N179 Acute kidney failure, unspecified: Secondary | ICD-10-CM | POA: Diagnosis not present

## 2020-12-03 DIAGNOSIS — N184 Chronic kidney disease, stage 4 (severe): Secondary | ICD-10-CM | POA: Diagnosis not present

## 2020-12-03 LAB — BASIC METABOLIC PANEL
Anion gap: 9 (ref 5–15)
BUN: 60 mg/dL — ABNORMAL HIGH (ref 8–23)
CO2: 19 mmol/L — ABNORMAL LOW (ref 22–32)
Calcium: 9 mg/dL (ref 8.9–10.3)
Chloride: 112 mmol/L — ABNORMAL HIGH (ref 98–111)
Creatinine, Ser: 4.43 mg/dL — ABNORMAL HIGH (ref 0.44–1.00)
GFR, Estimated: 10 mL/min — ABNORMAL LOW (ref >60)
Glucose, Bld: 127 mg/dL — ABNORMAL HIGH (ref 70–99)
Potassium: 3.8 mmol/L (ref 3.5–5.1)
Sodium: 140 mmol/L (ref 135–145)

## 2020-12-03 LAB — GLUCOSE, CAPILLARY
Glucose-Capillary: 136 mg/dL — ABNORMAL HIGH (ref 70–99)
Glucose-Capillary: 92 mg/dL (ref 70–99)
Glucose-Capillary: 120 mg/dL — ABNORMAL HIGH (ref 70–99)
Glucose-Capillary: 105 mg/dL — ABNORMAL HIGH (ref 70–99)

## 2020-12-03 NOTE — Progress Notes (Signed)
PROGRESS NOTE    [name]  ZOX:[id number] DOB: [birth year] DOA: 12/01/2020 PCP: Seward Carol, MD      Brief Narrative:  [name] is a 75 y.o. 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  Patient recently admitted for SOB, dizziness, Imdur and amlodipine stopped.  She was diuresed and put back on home diuretics and discharged.  At home, she was weak, had recurrent falls, and then PCP called that her renal function was worse, so she came to the ER.  Assessment & Plan:  Acute on chronic renal failure CKD IV Baseline Cr >2, here greater than 6 mg/dL.  No dyspnea, no uremia/confusion, normal K. - Given gentle IV fluids.  Slight improvement today. - CT scan of the abdomen pelvis with no evidence of hydronephrosis. - Followed by nephrology.  Recommended conservative management.  Orthostatic hypotension - Significant orthostatic hypotension but asymptomatic today.  She will take all orthostatic precautions.  Chronic diastolic CHF -Hold diuretics  History VTE -Continue low dose apixaban  Anxiety -Continue Prozac, mirtaz  Diabetes - Continue SS corrections  Anemia of CKD Hgb stable  Coronary disease -Continue Crestor  Lacunar infarct on CT Incidental finding.  Timing unclear.  No new focal neurological symptoms. - MRI with no acute findings.  On Eliquis and a statin.  Chronic gastritis -Continue PPI, sucralfate, Creon    Disposition: Status is: Inpatient  Remains inpatient appropriate because:Ongoing diagnostic testing needed not appropriate for outpatient work up and IV treatments appropriate due to intensity of illness or inability to take PO  Dispo: The patient is from: Home              Anticipated d/c is to: Acute inpatient rehab versus skilled nursing facility.              Patient currently is medically stable to  rehab level of care.   Difficult to place patient No    DVT prophylaxis: apixaban (ELIQUIS) tablet 2.5 mg Start: 12/02/20 1000 apixaban (ELIQUIS) tablet 2.5 mg  Code Status: FULL Family Communication: Daughter at the bedside.  Her other daughter on the phone.    Consultants:  Nephrology  Procedures:  CT head US renal CT renal MRI brain  Antimicrobials:  None.  Culture data:             Subjective: Patient herself denies any complaints.  Though her blood pressure dropped on standing, she had no dizziness or lightheadedness.   Objective: Vitals:   12/02/20 1955 12/02/20 2336 12/03/20 0420 12/03/20 1234  BP: (!) 151/78 112/67 (!) 143/68 137/75  Pulse: 82 79 80 90  Resp: 18 16 18 16   Temp: 98.4 F (36.9 C) 97.8 F (36.6 C) 97.9 F (36.6 C) 97.7 F (36.5 C)  TempSrc: Oral Oral Oral Oral  SpO2: 100% 97% 100% 98%  Weight:   58.2 kg   Height:        Intake/Output Summary (Last 24 hours) at 12/03/2020 1339 Last data filed at 12/02/2020 2200 Gross per 24 hour  Intake 240 ml  Output --  Net 240 ml   Filed Weights   12/01/20 1759 12/03/20 0420  Weight: 59 kg 58.2 kg    Examination: General: Frail and debilitated.  Chronically sick looking.  Not in distress. Cardiovascular: S1-S2 normal.  Regular rate rhythm. Respiratory: Bilateral clear.  No added sounds. Gastrointestinal: Soft.  Nontender. Ext: No cyanosis or edema. Neuro: Alert and oriented.  Generalized weakness.  No focal deficits.    Data Reviewed: I have personally reviewed following labs and imaging studies:  CBC: Recent Labs  Lab 12/01/20 1802 12/02/20 0934  WBC 7.1 7.8  NEUTROABS 5.9  --   HGB 11.4* 11.5*  HCT 38.1 37.2  MCV 89.0 86.7  PLT 277 539   Basic Metabolic Panel: Recent Labs  Lab 12/01/20 1802 12/02/20 0934 12/03/20 0249  NA 137 137 140  K 5.1 4.9 3.8  CL 109 108 112*  CO2 18* 17* 19*  GLUCOSE 116* 108* 127*  BUN 66* 65* 60*  CREATININE 6.19* 5.17* 4.43*   CALCIUM 9.5 9.5 9.0   GFR: Estimated Creatinine Clearance: 9.8 mL/min (A) (by C-G formula based on SCr of 4.43 mg/dL (H)). Liver Function Tests: Recent Labs  Lab 12/01/20 1802  AST 14*  ALT 14  ALKPHOS 66  BILITOT 0.6  PROT 7.0  ALBUMIN 3.2*   No results for input(s): LIPASE, AMYLASE in the last 168 hours. No results for input(s): AMMONIA in the last 168 hours. Coagulation Profile: No results for input(s): INR, PROTIME in the last 168 hours. Cardiac Enzymes: No results for input(s): CKTOTAL, CKMB, CKMBINDEX, TROPONINI in the last 168 hours. BNP (last 3 results) No results for input(s): PROBNP in the last 8760 hours. HbA1C: No results for input(s): HGBA1C in the last 72 hours. CBG: Recent Labs  Lab 12/02/20 1325 12/02/20 1719 12/02/20 2106 12/03/20 0609 12/03/20 1103  GLUCAP 88 103* 118* 136* 92   Lipid Profile: No results for input(s): CHOL, HDL, LDLCALC, TRIG, CHOLHDL, LDLDIRECT in the last 72 hours. Thyroid Function Tests: No results for input(s): TSH, T4TOTAL, FREET4, T3FREE, THYROIDAB in the last 72 hours. Anemia Panel: No results for input(s): VITAMINB12, FOLATE, FERRITIN, TIBC, IRON, RETICCTPCT in the last 72 hours. Urine analysis:    Component Value Date/Time   COLORURINE YELLOW 12/02/2020 1316   APPEARANCEUR HAZY (A) 12/02/2020 1316   APPEARANCEUR Clear 08/22/2018 1620   LABSPEC 1.009 12/02/2020 1316   LABSPEC 1.020 03/03/2014 1341   PHURINE 5.0 12/02/2020 1316   GLUCOSEU NEGATIVE 12/02/2020 1316   GLUCOSEU Negative 03/03/2014 1341   HGBUR NEGATIVE 12/02/2020 1316   BILIRUBINUR NEGATIVE 12/02/2020 1316   BILIRUBINUR Negative 08/22/2018 1620   BILIRUBINUR Negative 03/03/2014 1341   KETONESUR NEGATIVE 12/02/2020 1316   PROTEINUR NEGATIVE 12/02/2020 1316   UROBILINOGEN 0.2 03/03/2014 1341   NITRITE NEGATIVE 12/02/2020 1316   LEUKOCYTESUR NEGATIVE 12/02/2020 1316   LEUKOCYTESUR Negative 03/03/2014 1341   Sepsis  Labs: @LABRCNTIP (procalcitonin:4,lacticacidven:4)  ) Recent Results (from the past 240 hour(s))  SARS CORONAVIRUS 2 (TAT 6-24 HRS) Nasopharyngeal Nasopharyngeal Swab     Status: None   Collection Time: 12/02/20  4:29 AM   Specimen: Nasopharyngeal Swab  Result Value Ref Range Status   SARS Coronavirus 2 NEGATIVE NEGATIVE Final    Comment: (NOTE) SARS-CoV-2 target nucleic acids are NOT DETECTED.  The SARS-CoV-2 RNA is generally detectable in upper and lower respiratory specimens during the acute phase of infection. Negative results do not preclude SARS-CoV-2 infection, do not rule out co-infections with other pathogens, and should not be used as the sole basis for treatment or other patient management decisions. Negative results must be combined with clinical observations, patient history, and epidemiological information. The expected result is Negative.  Fact Sheet for Patients: SugarRoll.be  Fact Sheet for Healthcare Providers: https://www.woods-mathews.com/  This test is not yet approved or cleared by the [place]  States FDA and  has been authorized for detection and/or diagnosis of SARS-CoV-2 by FDA under an Emergency Use Authorization (EUA). This EUA will remain  in effect (meaning this test can be used) for the duration of the COVID-19 declaration under Se ction 564(b)(1) of the Act, 21 U.S.C. section 360bbb-3(b)(1), unless the authorization is terminated or revoked sooner.  Performed at [facility], [place] [address], [place], [place] [address]          Radiology Studies: CT HEAD WO CONTRAST (5MM)  Result Date: 12/01/2020 CLINICAL DATA:  Fall, head trauma, right eye pain, on Eliquis EXAM: CT HEAD WITHOUT CONTRAST TECHNIQUE: Contiguous axial images were obtained from the base of the skull through the vertex without intravenous contrast. COMPARISON:  08/09/2017 FINDINGS: Brain: No evidence of acute infarction, hemorrhage,  hydrocephalus, extra-axial collection or mass lesion/mass effect. Subcortical white matter and periventricular small vessel ischemic changes. Left basal ganglia lacunar infarct. Septum cavum pellucidum et vergae. Vascular: Intracranial atherosclerosis. Skull: Normal. Negative for fracture or focal lesion. Sinuses/Orbits: Partial opacification of the left maxillary sinus. Visualized paranasal sinuses and mastoid air cells are otherwise clear. Other: None. IMPRESSION: No evidence of acute intracranial abnormality. Left basal ganglia lacunar infarct. Small vessel ischemic changes. Electronically Signed   By: Julian Hy M.D.   On: 12/01/2020 19:08   MR BRAIN WO CONTRAST  Result Date: 12/02/2020 CLINICAL DATA:  Stroke, follow-up EXAM: MRI HEAD WITHOUT CONTRAST TECHNIQUE: Multiplanar, multiecho pulse sequences of the brain and surrounding structures were obtained without intravenous contrast. COMPARISON:  None. FINDINGS: Brain: There is no acute infarction or intracranial hemorrhage. There is no intracranial mass, mass effect, or edema. There is no hydrocephalus or extra-axial fluid collection. Cavum septum pellucidum et vergae is noted incidentally. Prominence of the ventricles and sulci reflects parenchymal volume loss. Chronic small vessel infarcts of the left basal ganglia and adjacent white matter as well as bilateral corona radiata. Additional patchy T2 hyperintensity in the supratentorial and pontine white matter is nonspecific but may reflect mild chronic microvascular ischemic changes. Punctate focus of susceptibility in the right occipital lobe likely reflects chronic microhemorrhage. Vascular: Major vessel flow voids at the skull base are preserved. Skull and upper cervical spine: Normal marrow signal is preserved. Sinuses/Orbits: Circumferential left maxillary sinus mucosal thickening. Bilateral lens replacements. Other: Sella is unremarkable.  Mastoid air cells are clear. IMPRESSION: No evidence of  recent infarction, hemorrhage, or mass. Mild chronic microvascular ischemic changes. Few small chronic small vessel infarcts. Electronically Signed   By: Macy Mis M.D.   On: 12/02/2020 18:14   US RENAL  Result Date: 12/02/2020 CLINICAL DATA:  Chronic kidney disease EXAM: RENAL / URINARY TRACT ULTRASOUND COMPLETE COMPARISON:  09/24/2020 FINDINGS: Right Kidney: Renal measurements: 9.6 x 5.6 x 4.1 cm = volume: 48 mL. Echogenic renal parenchyma. 1.8 x 1.7 x 1.6 cm upper pole cyst, simple. No hydronephrosis. Left Kidney: Renal measurements: 10.2 x 5.2 x 3.9 cm = volume: 110 mL. Echogenic renal parenchyma. 1.6 x 1.4 x 1.7 cm lower pole cyst, simple. Mild hydronephrosis. Bladder: Appears normal for degree of bladder distention. Other: None. IMPRESSION: Echogenic renal parenchyma, suggesting medical renal disease. Bilateral simple renal cysts, as above. Mild left hydronephrosis. Electronically Signed   By: Julian Hy M.D.   On: 12/02/2020 03:08   CT RENAL STONE STUDY  Result Date: 12/02/2020 CLINICAL DATA:  Left hydronephrosis seen on renal ultrasound. EXAM: CT ABDOMEN AND PELVIS WITHOUT CONTRAST TECHNIQUE: Multidetector CT imaging of the abdomen and pelvis was  performed following the standard protocol without IV contrast. COMPARISON:  Renal ultrasound from same day. CT abdomen pelvis dated September 03, 2020. FINDINGS: Lower chest: No acute abnormality. Hepatobiliary: No focal liver abnormality is seen. Status post cholecystectomy. No biliary dilatation. Pancreas: Unremarkable. No pancreatic ductal dilatation or surrounding inflammatory changes. Spleen: Normal in size without focal abnormality. Adrenals/Urinary Tract: The adrenal glands are unremarkable. Small bilateral renal cysts again noted. No renal calculi or hydronephrosis. The bladder is unremarkable. Stomach/Bowel: Unchanged small hiatal hernia. The stomach is otherwise within normal limits. Prior right ileocectomy with anastomosis in the right  lower quadrant. No bowel wall thickening, distention, or surrounding inflammatory changes. Vascular/Lymphatic: Aortic atherosclerosis. No enlarged abdominal or pelvic lymph nodes. Reproductive: Status post hysterectomy. No adnexal masses. Other: No free fluid or pneumoperitoneum. Musculoskeletal: No acute or significant osseous findings. IMPRESSION: 1. No acute intra-abdominal process. No hydronephrosis. 2. Aortic Atherosclerosis (ICD10-I70.0). Electronically Signed   By: Titus Dubin M.D.   On: 12/02/2020 16:20        Scheduled Meds:  apixaban  2.5 mg Oral BID   budesonide  3 mg Oral Daily   FLUoxetine  20 mg Oral QHS   insulin aspart  0-9 Units Subcutaneous TID WC   lipase/protease/amylase  36,000 Units Oral TID WC   mirtazapine  7.5 mg Oral QHS   pantoprazole  40 mg Oral QAC breakfast   rosuvastatin  40 mg Oral Daily   sodium bicarbonate  1,300 mg Oral TID   sodium chloride flush  3 mL Intravenous Q12H   sucralfate  1 g Oral TID WC & HS   Continuous Infusions:     LOS: 1 day    Time spent: 25 minutes    Barb Merino, MD [facility] 12/03/2020, 1:39 PM

## 2020-12-03 NOTE — Evaluation (Signed)
Occupational Therapy Evaluation Patient Details Name: [name] MRN: [id number] DOB: Feb 27, 1946 Today's Date: 12/03/2020   History of Present Illness Pt is 75 yo female presenting to [facility] ED on 9/15 after a fall. Also found to have ARF. Imaging showed Left basal ganglia  lacunar infarct.  PMH: recent COVID, CAD, CHF, R breast CA s/p lumpectomy, anemia, DM, HTN.   Clinical Impression   Pt admitted with above. She demonstrates the below listed deficits and will benefit from continued OT to maximize safety and independence with BADLs.  Pt presents to OT with generalized weakness, impaired balance, impaired cognition, ? Visual deficits, and decreased safety awareness.  She currently requires close min guard assist for ADLs and functional mobility with RW in the room. She lives alone with family providing as much assist as possible with IADLs and supervision with ADLs.  Family reports pt has been falling frequently.   Daughter is very hopeful that pt will be able to go to CIR and she would benefit from daily therapies to maximize her safety with ADLs and improve her care over with safety awareness.  If CIR not an option, recommend a short SNF stay to allow her maximize her safety and independence.  She will likely need 24 hour supervision at discharge.        Recommendations for follow up therapy are one component of a multi-disciplinary discharge planning process, led by the attending physician.  Recommendations may be updated based on patient status, additional functional criteria and insurance authorization.   Follow Up Recommendations  CIR;Supervision/Assistance - 24 hour    Equipment Recommendations  3 in 1 bedside commode    Recommendations for Other Services Rehab consult     Precautions / Restrictions Precautions Precautions: Fall;Other (comment) Precaution Comments: monitor orthostatic BP. Granddaughter reports 3 falls since previous d/c Restrictions Weight Bearing  Restrictions: No      Mobility Bed Mobility Overal bed mobility: Needs Assistance Bed Mobility: Supine to Sit;Sit to Supine     Supine to sit: Supervision Sit to supine: Supervision   General bed mobility comments: supervision for safety    Transfers Overall transfer level: Needs assistance Equipment used: Rolling walker (2 wheeled) Transfers: Sit to/from Stand Sit to Stand: Min guard         General transfer comment: Pt required min guard for safety only. Orthostatic vitals taken and were +, see flowsheet. Pt returned to supine. Pt asymptomatic throughout.    Balance Overall balance assessment: Needs assistance Sitting-balance support: Feet unsupported;No upper extremity supported Sitting balance-Leahy Scale: Good     Standing balance support: During functional activity Standing balance-Leahy Scale: Fair Standing balance comment: able to maintain standing during bathing with close min guard assist.  Does demonstrates decreased balance when moving outside her BOS                           ADL either performed or assessed with clinical judgement   ADL Overall ADL's : Needs assistance/impaired Eating/Feeding: Modified independent;Sitting   Grooming: Wash/dry hands;Wash/dry face;Oral care;Min guard;Standing   Upper Body Bathing: Min guard;Standing   Lower Body Bathing: Min guard;Sit to/from stand   Upper Body Dressing : Min guard;Standing   Lower Body Dressing: Min guard;Sit to/from stand   Toilet Transfer: Min guard;Ambulation;Comfort height toilet;RW;Grab bars   Toileting- Clothing Manipulation and Hygiene: Min guard;Sit to/from stand       Functional mobility during ADLs: Min guard;Rolling walker General ADL Comments: Pt requires close  min guard assist for ADLs in standing due to instability and balance deficits.  She requires verbal cues for safety and walker safety as she tends to frequently turn and reach outside of her BOS without walker in  front of her.  she attempted to lift her suitcase off the bed and carry it to the closet after multiple cues had been provided to her about need to keep RW in front of her and keep hands on RW at all times when ambulating     Vision Baseline Vision/History: 0 No visual deficits Patient Visual Report: No change from baseline Additional Comments: Pt requires min cues to locate TV buttons on the call bell.  SHe will benefit from further cognitive assessment     Perception Perception Perception Tested?: Yes Perception Deficits: Inattention/neglect Inattention/Neglect: Does not attend to left side of body Spatial deficits: possible mild Lt inattention.  Pt initially started to ambulate with RW without Lt hand on the walker.  She however, located items on her Lt and proceeded to incorporate Lt UE during activity   Praxis Praxis Praxis tested?: Within functional limits    Pertinent Vitals/Pain Pain Assessment: No/denies pain     Hand Dominance Right   Extremity/Trunk Assessment Upper Extremity Assessment Upper Extremity Assessment: LUE deficits/detail RUE Deficits / Details: AROM WFL; strength grossly 4+/5.  Pt demonstrated difficulty opening deodorant bottle LUE Deficits / Details: shoulder AROM limited to ~20-30* - daughter reports this is due to a previous fall;  Elbow distally WFL, however, pt demonstrated difficulty opening a deodorant bottle.  Pt initially failed to place her Lt hand on the RW and required cues to do so LUE Coordination: decreased gross motor   Lower Extremity Assessment Lower Extremity Assessment: Generalized weakness   Cervical / Trunk Assessment Cervical / Trunk Assessment: Normal   Communication Communication Communication: No difficulties   Cognition Arousal/Alertness: Awake/alert Behavior During Therapy: Flat affect Overall Cognitive Status: Impaired/Different from baseline Area of Impairment: Orientation;Attention;Memory;Following  commands;Safety/judgement;Awareness;Problem solving                 Orientation Level: Disoriented to;Time;Situation Current Attention Level: Selective Memory: Decreased short-term memory Following Commands: Follows one step commands consistently;Follows one step commands with increased time;Follows multi-step commands inconsistently Safety/Judgement: Decreased awareness of safety;Decreased awareness of deficits Awareness: Emergent;Intellectual Problem Solving: Slow processing;Requires verbal cues General Comments: Pt requires cues for safety awareness and safe walker use.  She requires cues to relay correct info re: PLOF.  She requires min cues for organization and problem solving during ADLs   General Comments  BP seated 113/70, HR 81; standing 71/56, HR 84; standing at 3 mins  92/59, HR 83.  Pt fully asymptomatic.  MD and RN made aware    Exercises Other Exercises Other Exercises: Pt inisistent on making her bed and did so with min guard assist and min cues for safety   Shoulder Instructions      Home Living Family/patient expects to be discharged to:: Private residence Living Arrangements: Alone (pt reports son, however, per granddaughter, she lives alone) Available Help at Discharge: Family;Available PRN/intermittently Type of Home: House Home Access: Level entry     Home Layout: Two level;Bed/bath upstairs Alternate Level Stairs-Number of Steps: 16 Alternate Level Stairs-Rails: Right (Stairs have a landing halfway) Bathroom Shower/Tub: Teacher, early years/pre: Standard Bathroom Accessibility: Yes   Home Equipment: Walker - 4 wheels   Additional Comments: Pt reports to OT this date that her son lives with her and provides 2  hour assist; however, daughter again reports that pt lives alone, with daughters and family providing as much assist as they are able      Prior Functioning/Environment Level of Independence: Needs assistance  Gait / Transfers  Assistance Needed: Using rollator since 2019, but lately has been forgetting about it. ADL's / Homemaking Assistance Needed: Needs help to get up the stairs. Family helps with ADLs PRN. CNA helps with bathing and feeding (sitter type assistance) in the morning for 2 hours a day 2x week.   Comments: Daughter reports pt has had multiple falls at home, and has several videos of ther falls.   daughter also reports pt has been having  memory difficulties and has lost ~60lbs in the course of several months (pt reports decreased appetite        OT Problem List: Decreased strength;Decreased activity tolerance;Impaired balance (sitting and/or standing);Impaired vision/perception;Decreased coordination;Decreased cognition;Decreased safety awareness;Decreased knowledge of use of DME or AE;Impaired UE functional use      OT Treatment/Interventions: Self-care/ADL training;Therapeutic exercise;Energy conservation;DME and/or AE instruction;Therapeutic activities;Patient/family education;Balance training;Cognitive remediation/compensation;Visual/perceptual remediation/compensation;Neuromuscular education    OT Goals(Current goals can be found in the care plan section) Acute Rehab OT Goals Patient Stated Goal: Daughter is hopeful for CIR to allow pt to maximize her independence and safety with ADLs OT Goal Formulation: With patient/family Time For Goal Achievement: 12/23/20 Potential to Achieve Goals: Good ADL Goals Pt Will Perform Upper Body Bathing: with supervision;standing Pt Will Perform Lower Body Bathing: with supervision;sit to/from stand Pt Will Perform Upper Body Dressing: with supervision;sitting;standing Pt Will Perform Lower Body Dressing: with supervision;sit to/from stand Pt Will Transfer to Toilet: with supervision;ambulating;regular height toilet;grab bars Pt Will Perform Toileting - Clothing Manipulation and hygiene: with supervision;sit to/from stand Additional ADL Goal #1: Pt will  demonstrates good safety awareness/walker safety during ADLs  OT Frequency: Min 2X/week   Barriers to D/C:            Co-evaluation              AM-PAC OT "6 Clicks" Daily Activity     Outcome Measure Help from another person eating meals?: None Help from another person taking care of personal grooming?: A Little Help from another person toileting, which includes using toliet, bedpan, or urinal?: A Little Help from another person bathing (including washing, rinsing, drying)?: A Little Help from another person to put on and taking off regular upper body clothing?: A Little Help from another person to put on and taking off regular lower body clothing?: A Little 6 Click Score: 19   End of Session Equipment Utilized During Treatment: Rolling walker;Gait belt Nurse Communication: Mobility status  Activity Tolerance: Patient tolerated treatment well Patient left: in chair;with call bell/phone within reach;Other (comment) (no chair alarm pads available - RN and CNA mad aware)  OT Visit Diagnosis: Unsteadiness on feet (R26.81);Cognitive communication deficit (R41.841) Symptoms and signs involving cognitive functions: Cerebral infarction                Time: 0911-1004 OT Time Calculation (min): 53 min Charges:  OT General Charges $OT Visit: 1 Visit OT Evaluation $OT Eval Moderate Complexity: 1 Mod OT Treatments $Self Care/Home Management : 38-52 mins  Nilsa Nutting., OTR/L Acute Rehabilitation Services Pager [phone] Office [phone]   Lucille Passy M 12/03/2020, 10:54 AM

## 2020-12-03 NOTE — Progress Notes (Signed)
[place] [facility] Progress Note    Assessment/ Plan:   AKI on CKD4 (improving): Likely secondary to pre-renal injury, possible orthostatic dizziness/hypotension -Underlying CKD likely related to hypertension, DM, cardiorenal syndrome, multiple AKI is due to nausea/vomiting/hypokalemia.  Baseline creatinine is overall fluctuant especially this year.  Her latest creatinine from our office on October 12, 2020 was 2.6, EGFR 19. Follows w/ Dr. Hollie Salk -hold on IVF, patient is drinking and eating. Monitor for now -renal diet -Continue to monitor daily Cr, Dose meds for GFR<15 -Monitor Daily I/Os, Daily weight  -Maintain MAP>65 for optimal renal perfusion.  -Avoid further nephrotoxins including NSAIDS, Morphine.  Unless absolutely necessary, avoid CT with contrast and/or MRI with gadolinium.   Fall, orthostatic dizziness -persistent orthostatic hypotension (just checked) agree with holding home meds, resume one at a time -check orthostats   Mild left hydronephrosis on u/s -CT renal stone study without any hydro 9/17   Hypertension, now with orthostatic hypotension: -resume home anti-HTN one at a time -compression stockings, abd binder   Metabolic acidosis -started nahco3 1347m tid   Anemia due to chronic disease: -Transfuse for Hgb<7 g/dL -receiving esa as an outpatient, Hgb at goal currently -monitor cbc   Diabetes Mellitus Type 2 -per primary   CKD-MBD -monitor phos and cal     Recommendations conveyed to primary service.   Subjective:   No acute events, feels better. Eating and drinking more today. No complaints   Objective:   BP (!) 143/68 (BP Location: Right Arm)   Pulse 80   Temp 97.9 F (36.6 C) (Oral)   Resp 18   Ht 5' 4"  (1.626 m)   Wt 58.2 kg   SpO2 100%   BMI 22.01 kg/m   Intake/Output Summary (Last 24 hours) at 12/03/2020 1128 Last data filed at 12/02/2020 2200 Gross per 24 hour  Intake 240 ml  Output --  Net 240 ml   Weight change: -0.849  kg  Physical Exam: Gen:nad CVS:rrr Resp:normal wob AOYD:XAJOEINO:MVEHMedema BL LE Neuro: no focal deficits  Imaging: CT HEAD WO CONTRAST (5MM)  Result Date: 12/01/2020 CLINICAL DATA:  Fall, head trauma, right eye pain, on Eliquis EXAM: CT HEAD WITHOUT CONTRAST TECHNIQUE: Contiguous axial images were obtained from the base of the skull through the vertex without intravenous contrast. COMPARISON:  08/09/2017 FINDINGS: Brain: No evidence of acute infarction, hemorrhage, hydrocephalus, extra-axial collection or mass lesion/mass effect. Subcortical white matter and periventricular small vessel ischemic changes. Left basal ganglia lacunar infarct. Septum cavum pellucidum et vergae. Vascular: Intracranial atherosclerosis. Skull: Normal. Negative for fracture or focal lesion. Sinuses/Orbits: Partial opacification of the left maxillary sinus. Visualized paranasal sinuses and mastoid air cells are otherwise clear. Other: None. IMPRESSION: No evidence of acute intracranial abnormality. Left basal ganglia lacunar infarct. Small vessel ischemic changes. Electronically Signed   By: SJulian HyM.D.   On: 12/01/2020 19:08   MR BRAIN WO CONTRAST  Result Date: 12/02/2020 CLINICAL DATA:  Stroke, follow-up EXAM: MRI HEAD WITHOUT CONTRAST TECHNIQUE: Multiplanar, multiecho pulse sequences of the brain and surrounding structures were obtained without intravenous contrast. COMPARISON:  None. FINDINGS: Brain: There is no acute infarction or intracranial hemorrhage. There is no intracranial mass, mass effect, or edema. There is no hydrocephalus or extra-axial fluid collection. Cavum septum pellucidum et vergae is noted incidentally. Prominence of the ventricles and sulci reflects parenchymal volume loss. Chronic small vessel infarcts of the left basal ganglia and adjacent white matter as well as bilateral corona radiata. Additional patchy T2 hyperintensity in  the supratentorial and pontine white matter is nonspecific  but may reflect mild chronic microvascular ischemic changes. Punctate focus of susceptibility in the right occipital lobe likely reflects chronic microhemorrhage. Vascular: Major vessel flow voids at the skull base are preserved. Skull and upper cervical spine: Normal marrow signal is preserved. Sinuses/Orbits: Circumferential left maxillary sinus mucosal thickening. Bilateral lens replacements. Other: Sella is unremarkable.  Mastoid air cells are clear. IMPRESSION: No evidence of recent infarction, hemorrhage, or mass. Mild chronic microvascular ischemic changes. Few small chronic small vessel infarcts. Electronically Signed   By: Macy Mis M.D.   On: 12/02/2020 18:14   US RENAL  Result Date: 12/02/2020 CLINICAL DATA:  Chronic kidney disease EXAM: RENAL / URINARY TRACT ULTRASOUND COMPLETE COMPARISON:  09/24/2020 FINDINGS: Right Kidney: Renal measurements: 9.6 x 5.6 x 4.1 cm = volume: 48 mL. Echogenic renal parenchyma. 1.8 x 1.7 x 1.6 cm upper pole cyst, simple. No hydronephrosis. Left Kidney: Renal measurements: 10.2 x 5.2 x 3.9 cm = volume: 110 mL. Echogenic renal parenchyma. 1.6 x 1.4 x 1.7 cm lower pole cyst, simple. Mild hydronephrosis. Bladder: Appears normal for degree of bladder distention. Other: None. IMPRESSION: Echogenic renal parenchyma, suggesting medical renal disease. Bilateral simple renal cysts, as above. Mild left hydronephrosis. Electronically Signed   By: Julian Hy M.D.   On: 12/02/2020 03:08   CT RENAL STONE STUDY  Result Date: 12/02/2020 CLINICAL DATA:  Left hydronephrosis seen on renal ultrasound. EXAM: CT ABDOMEN AND PELVIS WITHOUT CONTRAST TECHNIQUE: Multidetector CT imaging of the abdomen and pelvis was performed following the standard protocol without IV contrast. COMPARISON:  Renal ultrasound from same day. CT abdomen pelvis dated September 03, 2020. FINDINGS: Lower chest: No acute abnormality. Hepatobiliary: No focal liver abnormality is seen. Status post  cholecystectomy. No biliary dilatation. Pancreas: Unremarkable. No pancreatic ductal dilatation or surrounding inflammatory changes. Spleen: Normal in size without focal abnormality. Adrenals/Urinary Tract: The adrenal glands are unremarkable. Small bilateral renal cysts again noted. No renal calculi or hydronephrosis. The bladder is unremarkable. Stomach/Bowel: Unchanged small hiatal hernia. The stomach is otherwise within normal limits. Prior right ileocectomy with anastomosis in the right lower quadrant. No bowel wall thickening, distention, or surrounding inflammatory changes. Vascular/Lymphatic: Aortic atherosclerosis. No enlarged abdominal or pelvic lymph nodes. Reproductive: Status post hysterectomy. No adnexal masses. Other: No free fluid or pneumoperitoneum. Musculoskeletal: No acute or significant osseous findings. IMPRESSION: 1. No acute intra-abdominal process. No hydronephrosis. 2. Aortic Atherosclerosis (ICD10-I70.0). Electronically Signed   By: Titus Dubin M.D.   On: 12/02/2020 16:20    Labs: BMET Recent Labs  Lab 12/01/20 1802 12/02/20 0934 12/03/20 0249  NA 137 137 140  K 5.1 4.9 3.8  CL 109 108 112*  CO2 18* 17* 19*  GLUCOSE 116* 108* 127*  BUN 66* 65* 60*  CREATININE 6.19* 5.17* 4.43*  CALCIUM 9.5 9.5 9.0   CBC Recent Labs  Lab 12/01/20 1802 12/02/20 0934  WBC 7.1 7.8  NEUTROABS 5.9  --   HGB 11.4* 11.5*  HCT 38.1 37.2  MCV 89.0 86.7  PLT 277 260    Medications:     apixaban  2.5 mg Oral BID   budesonide  3 mg Oral Daily   FLUoxetine  20 mg Oral QHS   insulin aspart  0-9 Units Subcutaneous TID WC   lipase/protease/amylase  36,000 Units Oral TID WC   mirtazapine  7.5 mg Oral QHS   pantoprazole  40 mg Oral QAC breakfast   rosuvastatin  40 mg Oral Daily  sodium bicarbonate  1,300 mg Oral TID   sodium chloride flush  3 mL Intravenous Q12H   sucralfate  1 g Oral TID WC & HS      Gean Quint, MD [facility] 12/03/2020, 11:28 AM

## 2020-12-03 NOTE — Progress Notes (Addendum)
Inpatient Rehab Admissions:  Inpatient Rehab Consult received.  I met with patient at the bedside for rehabilitation assessment and to discuss goals and expectations of an inpatient rehab admission.  Pt acknowledged understanding and is interested in pursuing CIR. Pt gave permission to contact daughter, [name]. Spoke with [name] on the telephone. She also acknowledged understanding of CIR goals and expectations. She is supportive of pt pursuing CIR.  Pt and daughter made aware that if pt continues to progress as she has (reaches supervision level) while in acute care, CIR may not be warranted.  Will continue to follow.   Signed: Gayland Curry, [place], [place] Admissions Coordinator [phone]

## 2020-12-04 DIAGNOSIS — N184 Chronic kidney disease, stage 4 (severe): Secondary | ICD-10-CM | POA: Diagnosis not present

## 2020-12-04 DIAGNOSIS — N179 Acute kidney failure, unspecified: Secondary | ICD-10-CM | POA: Diagnosis not present

## 2020-12-04 LAB — CBC
HCT: 33.7 % — ABNORMAL LOW (ref 36.0–46.0)
Hemoglobin: 10.6 g/dL — ABNORMAL LOW (ref 12.0–15.0)
MCH: 27.4 pg (ref 26.0–34.0)
MCHC: 31.5 g/dL (ref 30.0–36.0)
MCV: 87.1 fL (ref 80.0–100.0)
Platelets: 209 10*3/uL (ref 150–400)
RBC: 3.87 MIL/uL (ref 3.87–5.11)
RDW: 21.8 % — ABNORMAL HIGH (ref 11.5–15.5)
WBC: 5.3 10*3/uL (ref 4.0–10.5)
nRBC: 0 % (ref 0.0–0.2)

## 2020-12-04 LAB — GLUCOSE, CAPILLARY
Glucose-Capillary: 81 mg/dL (ref 70–99)
Glucose-Capillary: 108 mg/dL — ABNORMAL HIGH (ref 70–99)
Glucose-Capillary: 117 mg/dL — ABNORMAL HIGH (ref 70–99)
Glucose-Capillary: 114 mg/dL — ABNORMAL HIGH (ref 70–99)

## 2020-12-04 LAB — RENAL FUNCTION PANEL
Albumin: 2.4 g/dL — ABNORMAL LOW (ref 3.5–5.0)
Anion gap: 7 (ref 5–15)
BUN: 47 mg/dL — ABNORMAL HIGH (ref 8–23)
CO2: 24 mmol/L (ref 22–32)
Calcium: 8.8 mg/dL — ABNORMAL LOW (ref 8.9–10.3)
Chloride: 109 mmol/L (ref 98–111)
Creatinine, Ser: 2.85 mg/dL — ABNORMAL HIGH (ref 0.44–1.00)
GFR, Estimated: 17 mL/min — ABNORMAL LOW (ref >60)
Glucose, Bld: 82 mg/dL (ref 70–99)
Phosphorus: 2.8 mg/dL (ref 2.5–4.6)
Potassium: 3.9 mmol/L (ref 3.5–5.1)
Sodium: 140 mmol/L (ref 135–145)

## 2020-12-04 NOTE — Progress Notes (Signed)
PROGRESS NOTE    [name]  TMH:[id number] DOB: 08/27/1945 DOA: 12/01/2020 PCP: Katie Carol, MD      Brief Narrative:  [name] is a 75 y.o. 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  Patient recently admitted for SOB, dizziness, Imdur and amlodipine stopped.  She was diuresed and put back on home diuretics and discharged.  At home, she was weak, had recurrent falls, and then PCP called that her renal function was worse, so she came to the ER.  Assessment & Plan:  Acute on chronic renal failure CKD IV Baseline Cr >2, here greater than 6 mg/dL.  No dyspnea, no uremia/confusion, normal K. - Given gentle IV fluids and kept off IV fluids now. - Creatinine continues to improve.  Today it is 2.8 which is at about her baseline. - CT scan of the abdomen pelvis with no evidence of hydronephrosis. - Followed by nephrology.  Recommended conservative management.  Orthostatic hypotension - Significant orthostatic hypotension but asymptomatic.  Chronic diastolic CHF -Hold diuretics, euvolemic.  History VTE -Continue low dose apixaban  Anxiety -Continue Prozac, mirtaz  Diabetes - Continue SS corrections  Anemia of CKD Hgb stable  Coronary disease -Continue Crestor  Lacunar infarct on CT Incidental finding.  Timing unclear.  No new focal neurological symptoms. - MRI with no acute findings.  On Eliquis and a statin.  Chronic gastritis -Continue PPI, sucralfate, Creon    Disposition: Status is: Inpatient  Remains inpatient appropriate because:Ongoing diagnostic testing needed not appropriate for outpatient work up and IV treatments appropriate due to intensity of illness or inability to take PO  Dispo: The patient is from: Home              Anticipated d/c is to: Acute inpatient rehab versus skilled nursing facility.               Patient currently is medically stable to rehab level of care.   Difficult to place patient No    DVT prophylaxis: apixaban (ELIQUIS) tablet 2.5 mg Start: 12/02/20 1000 apixaban (ELIQUIS) tablet 2.5 mg  Code Status: FULL Family Communication: None today.    Consultants:  Nephrology  Procedures:  CT head US renal CT renal MRI brain  Antimicrobials:  None.  Culture data:             Subjective: Seen and examined.  No overnight events.  Denies any complaints.  No family at the bedside.   Objective: Vitals:   12/03/20 1234 12/03/20 1808 12/03/20 2323 12/04/20 0519  BP: 137/75 (!) 156/83 115/61 (!) 156/76  Pulse: 90 89 78 75  Resp: 16 16 18 19   Temp: 97.7 F (36.5 C) 98.7 F (37.1 C) 98.4 F (36.9 C) 97.7 F (36.5 C)  TempSrc: Oral Oral Oral Oral  SpO2: 98% 99% 100% 100%  Weight:    59 kg  Height:        Intake/Output Summary (Last 24 hours) at 12/04/2020 1148 Last data filed at 12/04/2020 0830 Gross per 24 hour  Intake 720 ml  Output 100 ml  Net 620 ml   Filed Weights   12/01/20 1759 12/03/20 0420 12/04/20 0519  Weight: 59 kg 58.2 kg 59 kg    Examination: General: Frail and debilitated.  Chronically sick looking.  Not in distress. Cardiovascular: S1-S2 normal.  Regular rate rhythm. Respiratory: Bilateral clear.  No  added sounds. Gastrointestinal: Soft.  Nontender. Ext: No cyanosis or edema. Neuro: Alert and oriented.  Generalized weakness.  No focal deficits.    Data Reviewed: I have personally reviewed following labs and imaging studies:  CBC: Recent Labs  Lab 12/01/20 1802 12/02/20 0934 12/04/20 0734  WBC 7.1 7.8 5.3  NEUTROABS 5.9  --   --   HGB 11.4* 11.5* 10.6*  HCT 38.1 37.2 33.7*  MCV 89.0 86.7 87.1  PLT 277 260 400   Basic Metabolic Panel: Recent Labs  Lab 12/01/20 1802 12/02/20 0934 12/03/20 0249 12/04/20 0734  NA 137 137 140 140  K 5.1 4.9 3.8 3.9  CL 109 108 112* 109  CO2 18* 17* 19* 24  GLUCOSE 116* 108*  127* 82  BUN 66* 65* 60* 47*  CREATININE 6.19* 5.17* 4.43* 2.85*  CALCIUM 9.5 9.5 9.0 8.8*  PHOS  --   --   --  2.8   GFR: Estimated Creatinine Clearance: 15.2 mL/min (A) (by C-G formula based on SCr of 2.85 mg/dL (H)). Liver Function Tests: Recent Labs  Lab 12/01/20 1802 12/04/20 0734  AST 14*  --   ALT 14  --   ALKPHOS 66  --   BILITOT 0.6  --   PROT 7.0  --   ALBUMIN 3.2* 2.4*   No results for input(s): LIPASE, AMYLASE in the last 168 hours. No results for input(s): AMMONIA in the last 168 hours. Coagulation Profile: No results for input(s): INR, PROTIME in the last 168 hours. Cardiac Enzymes: No results for input(s): CKTOTAL, CKMB, CKMBINDEX, TROPONINI in the last 168 hours. BNP (last 3 results) No results for input(s): PROBNP in the last 8760 hours. HbA1C: No results for input(s): HGBA1C in the last 72 hours. CBG: Recent Labs  Lab 12/03/20 1103 12/03/20 1628 12/03/20 2119 12/04/20 0610 12/04/20 1141  GLUCAP 92 120* 105* 81 108*   Lipid Profile: No results for input(s): CHOL, HDL, LDLCALC, TRIG, CHOLHDL, LDLDIRECT in the last 72 hours. Thyroid Function Tests: No results for input(s): TSH, T4TOTAL, FREET4, T3FREE, THYROIDAB in the last 72 hours. Anemia Panel: No results for input(s): VITAMINB12, FOLATE, FERRITIN, TIBC, IRON, RETICCTPCT in the last 72 hours. Urine analysis:    Component Value Date/Time   COLORURINE YELLOW 12/02/2020 1316   APPEARANCEUR HAZY (A) 12/02/2020 1316   APPEARANCEUR Clear 08/22/2018 1620   LABSPEC 1.009 12/02/2020 1316   LABSPEC 1.020 03/03/2014 1341   PHURINE 5.0 12/02/2020 1316   GLUCOSEU NEGATIVE 12/02/2020 1316   GLUCOSEU Negative 03/03/2014 1341   HGBUR NEGATIVE 12/02/2020 1316   BILIRUBINUR NEGATIVE 12/02/2020 1316   BILIRUBINUR Negative 08/22/2018 1620   BILIRUBINUR Negative 03/03/2014 1341   KETONESUR NEGATIVE 12/02/2020 1316   PROTEINUR NEGATIVE 12/02/2020 1316   UROBILINOGEN 0.2 03/03/2014 1341   NITRITE NEGATIVE  12/02/2020 1316   LEUKOCYTESUR NEGATIVE 12/02/2020 1316   LEUKOCYTESUR Negative 03/03/2014 1341   Sepsis Labs: @LABRCNTIP (procalcitonin:4,lacticacidven:4)  ) Recent Results (from the past 240 hour(s))  SARS CORONAVIRUS 2 (TAT 6-24 HRS) Nasopharyngeal Nasopharyngeal Swab     Status: None   Collection Time: 12/02/20  4:29 AM   Specimen: Nasopharyngeal Swab  Result Value Ref Range Status   SARS Coronavirus 2 NEGATIVE NEGATIVE Final    Comment: (NOTE) SARS-CoV-2 target nucleic acids are NOT DETECTED.  The SARS-CoV-2 RNA is generally detectable in upper and lower respiratory specimens during the acute phase of infection. Negative results do not preclude SARS-CoV-2 infection, do not rule out co-infections with other pathogens, and should not be  used as the sole basis for treatment or other patient management decisions. Negative results must be combined with clinical observations, patient history, and epidemiological information. The expected result is Negative.  Fact Sheet for Patients: SugarRoll.be  Fact Sheet for Healthcare Providers: https://www.woods-mathews.com/  This test is not yet approved or cleared by the [place] FDA and  has been authorized for detection and/or diagnosis of SARS-CoV-2 by FDA under an Emergency Use Authorization (EUA). This EUA will remain  in effect (meaning this test can be used) for the duration of the COVID-19 declaration under Se ction 564(b)(1) of the Act, 21 U.S.C. section 360bbb-3(b)(1), unless the authorization is terminated or revoked sooner.  Performed at [facility], [place] [address] [place], [place] [address]          Radiology Studies: MR BRAIN WO CONTRAST  Result Date: 12/02/2020 CLINICAL DATA:  Stroke, follow-up EXAM: MRI HEAD WITHOUT CONTRAST TECHNIQUE: Multiplanar, multiecho pulse sequences of the brain and surrounding structures were obtained without intravenous contrast.  COMPARISON:  None. FINDINGS: Brain: There is no acute infarction or intracranial hemorrhage. There is no intracranial mass, mass effect, or edema. There is no hydrocephalus or extra-axial fluid collection. Cavum septum pellucidum et vergae is noted incidentally. Prominence of the ventricles and sulci reflects parenchymal volume loss. Chronic small vessel infarcts of the left basal ganglia and adjacent white matter as well as bilateral corona radiata. Additional patchy T2 hyperintensity in the supratentorial and pontine white matter is nonspecific but may reflect mild chronic microvascular ischemic changes. Punctate focus of susceptibility in the right occipital lobe likely reflects chronic microhemorrhage. Vascular: Major vessel flow voids at the skull base are preserved. Skull and upper cervical spine: Normal marrow signal is preserved. Sinuses/Orbits: Circumferential left maxillary sinus mucosal thickening. Bilateral lens replacements. Other: Sella is unremarkable.  Mastoid air cells are clear. IMPRESSION: No evidence of recent infarction, hemorrhage, or mass. Mild chronic microvascular ischemic changes. Few small chronic small vessel infarcts. Electronically Signed   By: Macy Mis M.D.   On: 12/02/2020 18:14   CT RENAL STONE STUDY  Result Date: 12/02/2020 CLINICAL DATA:  Left hydronephrosis seen on renal ultrasound. EXAM: CT ABDOMEN AND PELVIS WITHOUT CONTRAST TECHNIQUE: Multidetector CT imaging of the abdomen and pelvis was performed following the standard protocol without IV contrast. COMPARISON:  Renal ultrasound from same day. CT abdomen pelvis dated September 03, 2020. FINDINGS: Lower chest: No acute abnormality. Hepatobiliary: No focal liver abnormality is seen. Status post cholecystectomy. No biliary dilatation. Pancreas: Unremarkable. No pancreatic ductal dilatation or surrounding inflammatory changes. Spleen: Normal in size without focal abnormality. Adrenals/Urinary Tract: The adrenal glands are  unremarkable. Small bilateral renal cysts again noted. No renal calculi or hydronephrosis. The bladder is unremarkable. Stomach/Bowel: Unchanged small hiatal hernia. The stomach is otherwise within normal limits. Prior right ileocectomy with anastomosis in the right lower quadrant. No bowel wall thickening, distention, or surrounding inflammatory changes. Vascular/Lymphatic: Aortic atherosclerosis. No enlarged abdominal or pelvic lymph nodes. Reproductive: Status post hysterectomy. No adnexal masses. Other: No free fluid or pneumoperitoneum. Musculoskeletal: No acute or significant osseous findings. IMPRESSION: 1. No acute intra-abdominal process. No hydronephrosis. 2. Aortic Atherosclerosis (ICD10-I70.0). Electronically Signed   By: Titus Dubin M.D.   On: 12/02/2020 16:20        Scheduled Meds:  apixaban  2.5 mg Oral BID   budesonide  3 mg Oral Daily   FLUoxetine  20 mg Oral QHS   insulin aspart  0-9 Units Subcutaneous TID WC   lipase/protease/amylase  36,000 Units Oral TID WC   mirtazapine  7.5 mg Oral QHS   pantoprazole  40 mg Oral QAC breakfast   rosuvastatin  40 mg Oral Daily   sodium bicarbonate  1,300 mg Oral TID   sodium chloride flush  3 mL Intravenous Q12H   sucralfate  1 g Oral TID WC & HS   Continuous Infusions:     LOS: 2 days    Time spent: 25 minutes    Barb Merino, MD [facility] 12/04/2020, 11:48 AM

## 2020-12-04 NOTE — Progress Notes (Signed)
[facility] Progress Note    Assessment/ Plan:   AKI on CKD4 (improving): Likely secondary to pre-renal injury, possible orthostatic dizziness/hypotension -Underlying CKD likely related to hypertension, DM, cardiorenal syndrome, multiple AKI is due to nausea/vomiting/hypokalemia.  Baseline creatinine is overall fluctuant especially this year.  Her latest creatinine from our office on October 12, 2020 was 2.6, EGFR 19. Follows w/ Dr. Hollie Salk -Cr continues to improve even being off fluids -renal diet -Continue to monitor daily Cr, Dose meds for GFR<15 -Monitor Daily I/Os, Daily weight  -Maintain MAP>65 for optimal renal perfusion.  -Avoid further nephrotoxins including NSAIDS, Morphine.  Unless absolutely necessary, avoid CT with contrast and/or MRI with gadolinium.   Fall, orthostatic dizziness -persistent orthostatic hypotension (just checked) agree with holding home meds, resume one at a time -no longer having any symptoms of orthostatic dizziness (has been walking around her room) which is reassuring -monitor orthostats   Mild left hydronephrosis on u/s -CT renal stone study without any hydro 9/17   Hypertension, now with orthostatic hypotension: -resume home anti-HTN one at a time -compression stockings, abd binder   Metabolic acidosis -started nahco3 1333m tid   Anemia due to chronic disease: -Transfuse for Hgb<7 g/dL -receiving esa as an outpatient, Hgb at goal currently -monitor cbc   Diabetes Mellitus Type 2 -per primary   CKD-MBD -phos wnl, renal diet  Dispo: awaiting CIR     Will sign off from a nephrology perspective. Will need follow up at our office once discharged. Please call with any questions/concerns.  Subjective:   No acute events, feels better. Eating and drinking more today. No complaints   Objective:   BP (!) 156/76 (BP Location: Right Arm)   Pulse 75   Temp 97.7 F (36.5 C) (Oral)   Resp 19   Ht 5' 4"  (1.626 m)   Wt 59 kg    SpO2 100%   BMI 22.31 kg/m   Intake/Output Summary (Last 24 hours) at 12/04/2020 0950 Last data filed at 12/04/2020 0830 Gross per 24 hour  Intake 720 ml  Output 100 ml  Net 620 ml   Weight change: 0.816 kg  Physical Exam: Gen:nad CVS:rrr Resp:normal wob AUUV:OZDGEUYQ:IHKVQedema BL LE Neuro: no focal deficits  Imaging: MR BRAIN WO CONTRAST  Result Date: 12/02/2020 CLINICAL DATA:  Stroke, follow-up EXAM: MRI HEAD WITHOUT CONTRAST TECHNIQUE: Multiplanar, multiecho pulse sequences of the brain and surrounding structures were obtained without intravenous contrast. COMPARISON:  None. FINDINGS: Brain: There is no acute infarction or intracranial hemorrhage. There is no intracranial mass, mass effect, or edema. There is no hydrocephalus or extra-axial fluid collection. Cavum septum pellucidum et vergae is noted incidentally. Prominence of the ventricles and sulci reflects parenchymal volume loss. Chronic small vessel infarcts of the left basal ganglia and adjacent white matter as well as bilateral corona radiata. Additional patchy T2 hyperintensity in the supratentorial and pontine white matter is nonspecific but may reflect mild chronic microvascular ischemic changes. Punctate focus of susceptibility in the right occipital lobe likely reflects chronic microhemorrhage. Vascular: Major vessel flow voids at the skull base are preserved. Skull and upper cervical spine: Normal marrow signal is preserved. Sinuses/Orbits: Circumferential left maxillary sinus mucosal thickening. Bilateral lens replacements. Other: Sella is unremarkable.  Mastoid air cells are clear. IMPRESSION: No evidence of recent infarction, hemorrhage, or mass. Mild chronic microvascular ischemic changes. Few small chronic small vessel infarcts. Electronically Signed   By: PMacy MisM.D.   On: 12/02/2020 18:14   CT RENAL STONE  STUDY  Result Date: 12/02/2020 CLINICAL DATA:  Left hydronephrosis seen on renal ultrasound. EXAM: CT  ABDOMEN AND PELVIS WITHOUT CONTRAST TECHNIQUE: Multidetector CT imaging of the abdomen and pelvis was performed following the standard protocol without IV contrast. COMPARISON:  Renal ultrasound from same day. CT abdomen pelvis dated September 03, 2020. FINDINGS: Lower chest: No acute abnormality. Hepatobiliary: No focal liver abnormality is seen. Status post cholecystectomy. No biliary dilatation. Pancreas: Unremarkable. No pancreatic ductal dilatation or surrounding inflammatory changes. Spleen: Normal in size without focal abnormality. Adrenals/Urinary Tract: The adrenal glands are unremarkable. Small bilateral renal cysts again noted. No renal calculi or hydronephrosis. The bladder is unremarkable. Stomach/Bowel: Unchanged small hiatal hernia. The stomach is otherwise within normal limits. Prior right ileocectomy with anastomosis in the right lower quadrant. No bowel wall thickening, distention, or surrounding inflammatory changes. Vascular/Lymphatic: Aortic atherosclerosis. No enlarged abdominal or pelvic lymph nodes. Reproductive: Status post hysterectomy. No adnexal masses. Other: No free fluid or pneumoperitoneum. Musculoskeletal: No acute or significant osseous findings. IMPRESSION: 1. No acute intra-abdominal process. No hydronephrosis. 2. Aortic Atherosclerosis (ICD10-I70.0). Electronically Signed   By: Titus Dubin M.D.   On: 12/02/2020 16:20    Labs: BMET Recent Labs  Lab 12/01/20 1802 12/02/20 0934 12/03/20 0249 12/04/20 0734  NA 137 137 140 140  K 5.1 4.9 3.8 3.9  CL 109 108 112* 109  CO2 18* 17* 19* 24  GLUCOSE 116* 108* 127* 82  BUN 66* 65* 60* 47*  CREATININE 6.19* 5.17* 4.43* 2.85*  CALCIUM 9.5 9.5 9.0 8.8*  PHOS  --   --   --  2.8   CBC Recent Labs  Lab 12/01/20 1802 12/02/20 0934 12/04/20 0734  WBC 7.1 7.8 5.3  NEUTROABS 5.9  --   --   HGB 11.4* 11.5* 10.6*  HCT 38.1 37.2 33.7*  MCV 89.0 86.7 87.1  PLT 277 260 209    Medications:     apixaban  2.5 mg Oral BID    budesonide  3 mg Oral Daily   FLUoxetine  20 mg Oral QHS   insulin aspart  0-9 Units Subcutaneous TID WC   lipase/protease/amylase  36,000 Units Oral TID WC   mirtazapine  7.5 mg Oral QHS   pantoprazole  40 mg Oral QAC breakfast   rosuvastatin  40 mg Oral Daily   sodium bicarbonate  1,300 mg Oral TID   sodium chloride flush  3 mL Intravenous Q12H   sucralfate  1 g Oral TID WC & HS      Gean Quint, MD [facility] 12/04/2020, 9:50 AM

## 2020-12-05 DIAGNOSIS — N184 Chronic kidney disease, stage 4 (severe): Secondary | ICD-10-CM | POA: Diagnosis not present

## 2020-12-05 DIAGNOSIS — N179 Acute kidney failure, unspecified: Secondary | ICD-10-CM | POA: Diagnosis not present

## 2020-12-05 LAB — GLUCOSE, CAPILLARY
Glucose-Capillary: 108 mg/dL — ABNORMAL HIGH (ref 70–99)
Glucose-Capillary: 120 mg/dL — ABNORMAL HIGH (ref 70–99)
Glucose-Capillary: 135 mg/dL — ABNORMAL HIGH (ref 70–99)
Glucose-Capillary: 136 mg/dL — ABNORMAL HIGH (ref 70–99)

## 2020-12-05 NOTE — Care Management Important Message (Signed)
Important Message  Patient Details  Name: [name] MRN: [id number] Date of Birth: 1946-01-30   Medicare Important Message Given:  Yes     Memory Argue 12/05/2020, 12:47 PM

## 2020-12-05 NOTE — Progress Notes (Signed)
PROGRESS NOTE    [name]  JQZ:[id number] DOB: 1945/10/28 DOA: 12/01/2020 PCP: Seward Carol, MD      Brief Narrative:  [name] is a 75 y.o. 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 Patient recently admitted for SOB, dizziness, Imdur and amlodipine stopped.  She was diuresed and put back on home diuretics and discharged. At home, she was weak, had recurrent falls, and then PCP called that her renal function was worse, so she came to the ER.  Assessment & Plan:  Acute on chronic renal failure CKD IV Baseline Cr >2, here greater than 6 mg/dL.  No dyspnea, no uremia/confusion, normal K. - Given gentle IV fluids and kept off IV fluids now. - Creatinine continues to improve.  2.8 on 9/18.  We will recheck tomorrow morning. - CT scan of the abdomen pelvis with no evidence of hydronephrosis. - Followed by nephrology.  Recommended conservative management.  Orthostatic hypotension - Significant orthostatic hypotension but asymptomatic.  Off of all blood pressure medications.  Chronic diastolic CHF -Hold diuretics, euvolemic.  History VTE -Continue low dose apixaban  Anxiety -Continue Prozac, mirtaz  Diabetes - Continue SS corrections  Anemia of CKD Hgb stable  Coronary disease -Continue Crestor  Lacunar infarct on CT Incidental finding.  Timing unclear.  No new focal neurological symptoms. - MRI with no acute findings.  On Eliquis and a statin.  Chronic gastritis -Continue PPI, sucralfate, Creon    Disposition: Status is: Inpatient  Remains inpatient appropriate because:Ongoing diagnostic testing needed not appropriate for outpatient work up and IV treatments appropriate due to intensity of illness or inability to take PO  Dispo: The patient is from: Home              Anticipated d/c is to: Acute inpatient rehab versus  skilled nursing facility.              Patient currently is medically stable to rehab level of care.   Difficult to place patient No    DVT prophylaxis: apixaban (ELIQUIS) tablet 2.5 mg Start: 12/02/20 1000 apixaban (ELIQUIS) tablet 2.5 mg  Code Status: FULL Family Communication: none     Consultants:  Nephrology  Procedures:  CT head US renal CT renal MRI brain  Antimicrobials:  None.  Culture data:    Subjective: Patient seen and examined.  Denies any complaints.  Agreeable to rehab.   Objective: Vitals:   12/04/20 1231 12/04/20 1722 12/04/20 2259 12/05/20 0457  BP: 104/83 113/64 (!) 150/82 128/63  Pulse: 86 84 78 75  Resp: 18 18 16 18   Temp: 97.7 F (36.5 C) 97.9 F (36.6 C) 98.2 F (36.8 C) 98.4 F (36.9 C)  TempSrc: Oral Oral Oral Oral  SpO2: 100% 98% 96% 100%  Weight:    59.1 kg  Height:        Intake/Output Summary (Last 24 hours) at 12/05/2020 1122 Last data filed at 12/05/2020 0507 Gross per 24 hour  Intake 600 ml  Output 100 ml  Net 500 ml   Filed Weights   12/03/20 0420 12/04/20 0519 12/05/20 0457  Weight: 58.2 kg 59 kg 59.1 kg    Examination: General: Frail and debilitated.  Chronically sick looking.  Not in distress. Cardiovascular: S1-S2 normal.  Regular rate rhythm. Respiratory: Bilateral clear.  No added sounds. Gastrointestinal: Soft.  Nontender. Ext: No cyanosis or edema.  Neuro: Alert and oriented.  Generalized weakness.  No focal deficits.    Data Reviewed: I have personally reviewed following labs and imaging studies:  CBC: Recent Labs  Lab 12/01/20 1802 12/02/20 0934 12/04/20 0734  WBC 7.1 7.8 5.3  NEUTROABS 5.9  --   --   HGB 11.4* 11.5* 10.6*  HCT 38.1 37.2 33.7*  MCV 89.0 86.7 87.1  PLT 277 260 497   Basic Metabolic Panel: Recent Labs  Lab 12/01/20 1802 12/02/20 0934 12/03/20 0249 12/04/20 0734  NA 137 137 140 140  K 5.1 4.9 3.8 3.9  CL 109 108 112* 109  CO2 18* 17* 19* 24  GLUCOSE 116* 108* 127* 82   BUN 66* 65* 60* 47*  CREATININE 6.19* 5.17* 4.43* 2.85*  CALCIUM 9.5 9.5 9.0 8.8*  PHOS  --   --   --  2.8   GFR: Estimated Creatinine Clearance: 15.2 mL/min (A) (by C-G formula based on SCr of 2.85 mg/dL (H)). Liver Function Tests: Recent Labs  Lab 12/01/20 1802 12/04/20 0734  AST 14*  --   ALT 14  --   ALKPHOS 66  --   BILITOT 0.6  --   PROT 7.0  --   ALBUMIN 3.2* 2.4*   No results for input(s): LIPASE, AMYLASE in the last 168 hours. No results for input(s): AMMONIA in the last 168 hours. Coagulation Profile: No results for input(s): INR, PROTIME in the last 168 hours. Cardiac Enzymes: No results for input(s): CKTOTAL, CKMB, CKMBINDEX, TROPONINI in the last 168 hours. BNP (last 3 results) No results for input(s): PROBNP in the last 8760 hours. HbA1C: No results for input(s): HGBA1C in the last 72 hours. CBG: Recent Labs  Lab 12/04/20 0610 12/04/20 1141 12/04/20 1551 12/04/20 2103 12/05/20 0613  GLUCAP 81 108* 117* 114* 108*   Lipid Profile: No results for input(s): CHOL, HDL, LDLCALC, TRIG, CHOLHDL, LDLDIRECT in the last 72 hours. Thyroid Function Tests: No results for input(s): TSH, T4TOTAL, FREET4, T3FREE, THYROIDAB in the last 72 hours. Anemia Panel: No results for input(s): VITAMINB12, FOLATE, FERRITIN, TIBC, IRON, RETICCTPCT in the last 72 hours. Urine analysis:    Component Value Date/Time   COLORURINE YELLOW 12/02/2020 1316   APPEARANCEUR HAZY (A) 12/02/2020 1316   APPEARANCEUR Clear 08/22/2018 1620   LABSPEC 1.009 12/02/2020 1316   LABSPEC 1.020 03/03/2014 1341   PHURINE 5.0 12/02/2020 1316   GLUCOSEU NEGATIVE 12/02/2020 1316   GLUCOSEU Negative 03/03/2014 1341   HGBUR NEGATIVE 12/02/2020 1316   BILIRUBINUR NEGATIVE 12/02/2020 1316   BILIRUBINUR Negative 08/22/2018 1620   BILIRUBINUR Negative 03/03/2014 1341   KETONESUR NEGATIVE 12/02/2020 1316   PROTEINUR NEGATIVE 12/02/2020 1316   UROBILINOGEN 0.2 03/03/2014 1341   NITRITE NEGATIVE  12/02/2020 1316   LEUKOCYTESUR NEGATIVE 12/02/2020 1316   LEUKOCYTESUR Negative 03/03/2014 1341   Sepsis Labs: @LABRCNTIP (procalcitonin:4,lacticacidven:4)  ) Recent Results (from the past 240 hour(s))  SARS CORONAVIRUS 2 (TAT 6-24 HRS) Nasopharyngeal Nasopharyngeal Swab     Status: None   Collection Time: 12/02/20  4:29 AM   Specimen: Nasopharyngeal Swab  Result Value Ref Range Status   SARS Coronavirus 2 NEGATIVE NEGATIVE Final    Comment: (NOTE) SARS-CoV-2 target nucleic acids are NOT DETECTED.  The SARS-CoV-2 RNA is generally detectable in upper and lower respiratory specimens during the acute phase of infection. Negative results do not preclude SARS-CoV-2 infection, do not rule out co-infections with other pathogens, and should not be used as the sole basis for treatment or other patient management  decisions. Negative results must be combined with clinical observations, patient history, and epidemiological information. The expected result is Negative.  Fact Sheet for Patients: SugarRoll.be  Fact Sheet for Healthcare Providers: https://www.woods-mathews.com/  This test is not yet approved or cleared by the [place] FDA and  has been authorized for detection and/or diagnosis of SARS-CoV-2 by FDA under an Emergency Use Authorization (EUA). This EUA will remain  in effect (meaning this test can be used) for the duration of the COVID-19 declaration under Se ction 564(b)(1) of the Act, 21 U.S.C. section 360bbb-3(b)(1), unless the authorization is terminated or revoked sooner.  Performed at [facility], [place] [address], [place], [place] [address]          Radiology Studies: No results found.      Scheduled Meds:  apixaban  2.5 mg Oral BID   budesonide  3 mg Oral Daily   FLUoxetine  20 mg Oral QHS   insulin aspart  0-9 Units Subcutaneous TID WC   lipase/protease/amylase  36,000 Units Oral TID WC   mirtazapine   7.5 mg Oral QHS   pantoprazole  40 mg Oral QAC breakfast   rosuvastatin  40 mg Oral Daily   sodium bicarbonate  1,300 mg Oral TID   sodium chloride flush  3 mL Intravenous Q12H   sucralfate  1 g Oral TID WC & HS   Continuous Infusions:     LOS: 3 days    Time spent: 25 minutes    Barb Merino, MD [facility] 12/05/2020, 11:22 AM

## 2020-12-05 NOTE — Consult Note (Signed)
[name] CM Inpatient Consult   12/05/2020  [name] 10-29-1945 [id number]  [facility] [ACO] Patient:  [name]  Primary Care Provider:  Seward Carol, MD  Patient has been discussed in the [facility] Multidisciplinary conference discussions regarding patient readmission and barriers to health care goals.  Patient is currently active with [facility] for chronic disease management services.  Patient has been engaged by a [facility].  Reviewed for progress. Patient is being requested for inpatient rehab from family for transition noted. Met with patient at the bedside patient is up in the chair for lunch.  She states she desires to be home but all of her children work. She states she stays with her son and is asking him to talk with the hospital staff about "everything" tonight.     Plan:  Continue to follow for progress and disposition with Inpatient Transition Of Care [TOC] team member to make aware that [facility] following.   Of note, [facility] Care Management services does not replace or interfere with any services that are needed or arranged by inpatient [facility] care management team.  For additional questions or referrals please contact:  Natividad Brood, RN BSN [facility] Liaison  [phone] business mobile phone Toll free office [phone]  Fax number: [phone] [name]

## 2020-12-05 NOTE — Progress Notes (Signed)
Physical Therapy Treatment Patient Details Name: [name] MRN: [id number] DOB: 09-28-1945 Today's Date: 12/05/2020   History of Present Illness Pt is 75 yo female presenting to [facility] ED on 9/15 after a fall. Also found to have ARF. Imaging showed Left basal ganglia  lacunar infarct.  PMH: recent COVID, CAD, CHF, R breast CA s/p lumpectomy, anemia, DM, HTN.    PT Comments    Pt making gradual progress. She did have orthostatic BP but was asymptomatic and improved with walking.  Does continue to have mild L inattention and decreased safety awareness.  Due to decreased safety, frequently home alone, and 3 falls since last admission (only a couple weeks) - continue to recommend post-acute rehab.  May also need to consider longer term options like ALF in the future due to family reporting declining mental status and falls.     Recommendations for follow up therapy are one component of a multi-disciplinary discharge planning process, led by the attending physician.  Recommendations may be updated based on patient status, additional functional criteria and insurance authorization.  Follow Up Recommendations  CIR;Supervision/Assistance - 24 hour     Equipment Recommendations  Rolling walker with 5" wheels    Recommendations for Other Services       Precautions / Restrictions Precautions Precautions: Fall;Other (comment) Precaution Comments: monitor orthostatic BP. Granddaughter reports 3 falls since previous d/c Restrictions Weight Bearing Restrictions: No     Mobility  Bed Mobility Overal bed mobility: Needs Assistance             General bed mobility comments: In chair at arrival    Transfers Overall transfer level: Needs assistance Equipment used: Rolling walker (2 wheeled) Transfers: Sit to/from Stand Sit to Stand: Min guard         General transfer comment: Pt required min guard for safety only. Asymptomatic throughout.  Ambulation/Gait Ambulation/Gait  assistance: Min guard Gait Distance (Feet): 120 Feet Assistive device: Rolling walker (2 wheeled) Gait Pattern/deviations: Step-through pattern     General Gait Details: Cues for safety and RW use   Stairs             Wheelchair Mobility    Modified Rankin (Stroke Patients Only) Modified Rankin (Stroke Patients Only) Pre-Morbid Rankin Score: Slight disability Modified Rankin: Moderately severe disability     Balance Overall balance assessment: Needs assistance Sitting-balance support: No upper extremity supported;Feet supported Sitting balance-Leahy Scale: Normal     Standing balance support: No upper extremity supported;Bilateral upper extremity supported Standing balance-Leahy Scale: Fair Standing balance comment: RW to ambulate, static stand without UE support but some instability with weight shifting                            Cognition Arousal/Alertness: Awake/alert Behavior During Therapy: Flat affect Overall Cognitive Status: Impaired/Different from baseline Area of Impairment: Orientation;Attention;Memory;Following commands;Safety/judgement;Awareness;Problem solving                 Orientation Level: Disoriented to;Situation Current Attention Level: Selective Memory: Decreased short-term memory Following Commands: Follows one step commands consistently Safety/Judgement: Decreased awareness of safety;Decreased awareness of deficits Awareness: Emergent Problem Solving: Requires verbal cues General Comments: Cues for safety awareness and monitoring of orthostatic bp      Exercises Other Exercises Other Exercises: Patient with request to "straighten up room" and make bed. Close supervision A provided for completion of task. Cues for precaution with change in position.    General Comments General comments (  skin integrity, edema, etc.): BP 116/71 sit, 86/54 stand, 113/57 post walk.  Completely asymptomatic. Pt with mild L inattention       Pertinent Vitals/Pain Pain Assessment: No/denies pain    Home Living                      Prior Function            PT Goals (current goals can now be found in the care plan section) Acute Rehab PT Goals Patient Stated Goal: Daughter is hopeful for CIR to allow pt to maximize her independence and safety with ADLs Progress towards PT goals: Progressing toward goals    Frequency    Min 3X/week      PT Plan Current plan remains appropriate    Co-evaluation              AM-PAC PT "6 Clicks" Mobility   Outcome Measure  Help needed turning from your back to your side while in a flat bed without using bedrails?: A Little Help needed moving from lying on your back to sitting on the side of a flat bed without using bedrails?: A Little Help needed moving to and from a bed to a chair (including a wheelchair)?: A Little Help needed standing up from a chair using your arms (e.g., wheelchair or bedside chair)?: A Little Help needed to walk in hospital room?: A Little Help needed climbing 3-5 steps with a railing? : A Little 6 Click Score: 18    End of Session Equipment Utilized During Treatment: Gait belt Activity Tolerance: Patient tolerated treatment well Patient left: with call bell/phone within reach;with chair alarm set;in chair Nurse Communication: Mobility status PT Visit Diagnosis: Other abnormalities of gait and mobility (R26.89);Muscle weakness (generalized) (M62.81);Repeated falls (R29.6)     Time: 3149-7026 PT Time Calculation (min) (ACUTE ONLY): 18 min  Charges:  $Gait Training: 8-22 mins                     Abran Richard, PT Acute Rehab Services Pager [phone] [facility] 12/05/2020, 1:55 PM

## 2020-12-05 NOTE — Progress Notes (Signed)
Inpatient Rehab Admissions Coordinator:  Saw pt at bedside. Informed her that awaiting insurance authorization. Will continue to follow.   Gayland Curry, [place], [place] Admissions Coordinator [phone]

## 2020-12-05 NOTE — Progress Notes (Signed)
Occupational Therapy Treatment Patient Details Name: [name] MRN: [id number] DOB: [birth year] Today's Date: 12/05/2020   History of present illness Pt is 75 yo female presenting to [facility] ED on 9/15 after a fall. Also found to have ARF. Imaging showed Left basal ganglia  lacunar infarct.  PMH: recent COVID, CAD, CHF, R breast CA s/p lumpectomy, anemia, DM, HTN.   OT comments  Patient met seated EOB. Reports completion of bathing/dressing task on her own although NT instructed patient to wait. Patient up and about room without use of RW indicating decreased safety awareness. Patient also with poor carryover of orthostatic precautions taught at time of OT evaluation yesterday. Min guard grossly for grooming standing at sink level, bed making task and light housekeeping task. Patient with drop in BP from 130's over 80's seated EOB to 119/66 upon standing. Further decrease in BP to 88/47 after standing for several minutes. OT will continue to follow acutely.    Recommendations for follow up therapy are one component of a multi-disciplinary discharge planning process, led by the attending physician.  Recommendations may be updated based on patient status, additional functional criteria and insurance authorization.    Follow Up Recommendations  CIR;Supervision/Assistance - 24 hour    Equipment Recommendations  3 in 1 bedside commode    Recommendations for Other Services Rehab consult    Precautions / Restrictions Precautions Precautions: Fall;Other (comment) Precaution Comments: monitor orthostatic BP. Granddaughter reports 3 falls since previous d/c Restrictions Weight Bearing Restrictions: No       Mobility Bed Mobility Overal bed mobility: Needs Assistance             General bed mobility comments: Patient seated EOB upon entry.    Transfers Overall transfer level: Needs assistance Equipment used: Rolling walker (2 wheeled);None Transfers: Sit to/from Stand Sit to  Stand: Min guard         General transfer comment: Pt required min guard for safety only. Asymptomatic throughout.    Balance Overall balance assessment: Needs assistance Sitting-balance support: Feet unsupported;No upper extremity supported Sitting balance-Leahy Scale: Good     Standing balance support: During functional activity Standing balance-Leahy Scale: Fair                             ADL either performed or assessed with clinical judgement   ADL Overall ADL's : Needs assistance/impaired Eating/Feeding: Modified independent;Sitting   Grooming: Wash/dry hands;Wash/dry face;Oral care;Min guard;Standing   Upper Body Bathing: Min guard;Standing   Lower Body Bathing: Min guard;Sit to/from stand   Upper Body Dressing : Min guard;Standing   Lower Body Dressing: Min guard;Sit to/from stand   Toilet Transfer: Min guard;Ambulation;Comfort height toilet;RW;Grab bars   Toileting- Clothing Manipulation and Hygiene: Min guard;Sit to/from stand       Functional mobility during ADLs: Passenger transport manager     Praxis      Cognition Arousal/Alertness: Awake/alert Behavior During Therapy: Flat affect Overall Cognitive Status: Impaired/Different from baseline Area of Impairment: Orientation;Attention;Memory;Following commands;Safety/judgement;Awareness;Problem solving                 Orientation Level: Disoriented to;Time;Situation Current Attention Level: Selective Memory: Decreased short-term memory Following Commands: Follows one step commands consistently;Follows one step commands with increased time;Follows multi-step commands inconsistently Safety/Judgement: Decreased awareness of safety;Decreased awareness of deficits Awareness: Emergent;Intellectual Problem Solving: Slow processing;Requires verbal cues General Comments: Cues for  safety awareness, problem solving during ADLs and repeat reminders for  orthostatic precautions.        Exercises Other Exercises Other Exercises: Patient with request to "straighten up room" and make bed. Close supervision A provided for completion of task. Cues for precaution with change in position.   Shoulder Instructions       General Comments BP 130's over 80's seated EOB upon entry. BP dropped to 119/66 upon standing and down to 88/47 after several minutes in standing. Patient asymptomatic.    Pertinent Vitals/ Pain       Pain Assessment: No/denies pain  Home Living                                          Prior Functioning/Environment              Frequency  Min 2X/week        Progress Toward Goals  OT Goals(current goals can now be found in the care plan section)  Progress towards OT goals: Progressing toward goals  Acute Rehab OT Goals Patient Stated Goal: Daughter is hopeful for CIR to allow pt to maximize her independence and safety with ADLs OT Goal Formulation: With patient/family Time For Goal Achievement: 12/23/20 Potential to Achieve Goals: Good ADL Goals Pt Will Perform Grooming: with modified independence;standing Pt Will Perform Upper Body Bathing: with supervision;standing Pt Will Perform Lower Body Bathing: with supervision;sit to/from stand Pt Will Perform Upper Body Dressing: with supervision;sitting;standing Pt Will Perform Lower Body Dressing: with supervision;sit to/from stand Pt Will Transfer to Toilet: with supervision;ambulating;regular height toilet;grab bars Pt Will Perform Toileting - Clothing Manipulation and hygiene: with supervision;sit to/from stand Additional ADL Goal #1: Pt will demonstrates good safety awareness/walker safety during ADLs  Plan Frequency remains appropriate;Discharge plan needs to be updated    Co-evaluation                 AM-PAC OT "6 Clicks" Daily Activity     Outcome Measure   Help from another person eating meals?: None Help from another  person taking care of personal grooming?: A Little Help from another person toileting, which includes using toliet, bedpan, or urinal?: A Little Help from another person bathing (including washing, rinsing, drying)?: A Little Help from another person to put on and taking off regular upper body clothing?: A Little Help from another person to put on and taking off regular lower body clothing?: A Little 6 Click Score: 19    End of Session Equipment Utilized During Treatment: Rolling walker;Gait belt  OT Visit Diagnosis: Unsteadiness on feet (R26.81);Cognitive communication deficit (R41.841) Symptoms and signs involving cognitive functions: Cerebral infarction   Activity Tolerance Patient tolerated treatment well   Patient Left in chair;with call bell/phone within reach;with chair alarm set   Nurse Communication Mobility status        Time: 4356-8616 OT Time Calculation (min): 30 min  Charges: OT General Charges $OT Visit: 1 Visit OT Treatments $Self Care/Home Management : 23-37 mins  Gerritt Galentine H. OTR/L Supplemental OT, Department of rehab services [phone]  Tranice Laduke R H. 12/05/2020, 12:19 PM

## 2020-12-06 DIAGNOSIS — N179 Acute kidney failure, unspecified: Principal | ICD-10-CM

## 2020-12-06 DIAGNOSIS — N184 Chronic kidney disease, stage 4 (severe): Secondary | ICD-10-CM

## 2020-12-06 DIAGNOSIS — D631 Anemia in chronic kidney disease: Secondary | ICD-10-CM | POA: Diagnosis not present

## 2020-12-06 LAB — BASIC METABOLIC PANEL
Anion gap: 8 (ref 5–15)
BUN: 42 mg/dL — ABNORMAL HIGH (ref 8–23)
CO2: 26 mmol/L (ref 22–32)
Calcium: 8.5 mg/dL — ABNORMAL LOW (ref 8.9–10.3)
Chloride: 106 mmol/L (ref 98–111)
Creatinine, Ser: 2.12 mg/dL — ABNORMAL HIGH (ref 0.44–1.00)
GFR, Estimated: 24 mL/min — ABNORMAL LOW (ref >60)
Glucose, Bld: 101 mg/dL — ABNORMAL HIGH (ref 70–99)
Potassium: 3.1 mmol/L — ABNORMAL LOW (ref 3.5–5.1)
Sodium: 140 mmol/L (ref 135–145)

## 2020-12-06 LAB — GLUCOSE, CAPILLARY
Glucose-Capillary: 81 mg/dL (ref 70–99)
Glucose-Capillary: 125 mg/dL — ABNORMAL HIGH (ref 70–99)
Glucose-Capillary: 128 mg/dL — ABNORMAL HIGH (ref 70–99)
Glucose-Capillary: 138 mg/dL — ABNORMAL HIGH (ref 70–99)

## 2020-12-06 MED ORDER — POTASSIUM CHLORIDE CRYS ER 20 MEQ PO TBCR
40.0000 meq | EXTENDED_RELEASE_TABLET | Freq: Two times a day (BID) | ORAL | Status: AC
  Administered 2020-12-06 – 2020-12-07 (×4): 40 meq via ORAL
  Filled 2020-12-06 (×4): qty 2

## 2020-12-06 MED ORDER — EPOETIN ALFA-EPBX 10000 UNIT/ML IJ SOLN: 10000.0000 [IU] | Freq: Once | INTRAMUSCULAR | Status: DC

## 2020-12-06 MED ORDER — DARBEPOETIN ALFA 200 MCG/0.4ML IJ SOSY
200.0000 ug | PREFILLED_SYRINGE | Freq: Once | INTRAMUSCULAR | Status: AC
  Administered 2020-12-07: 200 ug via SUBCUTANEOUS
  Filled 2020-12-06: qty 0.4

## 2020-12-06 NOTE — Progress Notes (Signed)
PROGRESS NOTE    [name]  SWV:[id number] DOB: [birth year] DOA: 12/01/2020 PCP: Seward Carol, MD      Brief Narrative:  [name] is a 75 y.o. 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 Patient recently admitted for SOB, dizziness, Imdur and amlodipine stopped.  She was diuresed and put back on home diuretics and discharged. At home, she was weak, had recurrent falls, and then PCP called that her renal function was worse, so she came to the ER.  Assessment & Plan:  Acute on chronic renal failure CKD IV Baseline Cr >2, here greater than 6 mg/dL.  No dyspnea, no uremia/confusion, normal K. - Given gentle IV fluids and kept off IV fluids now. - Creatinine continues to improve.  2.2 today.   - CT scan of the abdomen pelvis with no evidence of hydronephrosis. - Followed by nephrology.  Recommended conservative management. _ replace potassium today.  Orthostatic hypotension - Significant orthostatic hypotension but asymptomatic.  Off of all blood pressure medications.  Chronic diastolic CHF -Hold diuretics, euvolemic.  History VTE -Continue low dose apixaban  Anxiety -Continue Prozac, mirtaz  Diabetes - Continue SS corrections  Anemia of CKD Hgb stable  Coronary disease -Continue Crestor  Lacunar infarct on CT Incidental finding.  Timing unclear.  No new focal neurological symptoms. - MRI with no acute findings.  On Eliquis and a statin.  Chronic gastritis -Continue PPI, sucralfate, Creon    Disposition: Status is: Inpatient  Remains inpatient appropriate because:Ongoing diagnostic testing needed not appropriate for outpatient work up and IV treatments appropriate due to intensity of illness or inability to take PO  Dispo: The patient is from: Home              Anticipated d/c is to: Acute inpatient rehab versus skilled  nursing facility.              Patient currently is medically stable to rehab level of care.   Difficult to place patient No    DVT prophylaxis: apixaban (ELIQUIS) tablet 2.5 mg Start: 12/02/20 1000 apixaban (ELIQUIS) tablet 2.5 mg  Code Status: FULL Family Communication: none     Consultants:  Nephrology  Procedures:  CT head US renal CT renal MRI brain  Antimicrobials:  None.  Culture data:    Subjective: Patient seen and examined.  Denies any complaints.  Independently trying to walk to bathroom and washing . Ambulatory better now .   Objective: Vitals:   12/05/20 1835 12/06/20 0016 12/06/20 0505 12/06/20 1204  BP: 128/69 (!) 155/74 (!) 162/77 125/72  Pulse: 83 74 75 80  Resp: 16 18 18 18   Temp: 98.6 F (37 C) 98.3 F (36.8 C) 98.2 F (36.8 C) 98.3 F (36.8 C)  TempSrc: Oral Oral Oral Oral  SpO2: 99% 100% 100% 98%  Weight:   59.2 kg   Height:        Intake/Output Summary (Last 24 hours) at 12/06/2020 1311 Last data filed at 12/05/2020 2200 Gross per 24 hour  Intake 690 ml  Output 200 ml  Net 490 ml   Filed Weights   12/04/20 0519 12/05/20 0457 12/06/20 0505  Weight: 59 kg 59.1 kg 59.2 kg    Examination: General: Frail and debilitated.  Comfortable. Washing herself. Not in distress. Cardiovascular: S1-S2 normal.  Regular rate rhythm. Respiratory: Bilateral clear.  No added sounds.  Gastrointestinal: Soft.  Nontender. Ext: No cyanosis or edema. Neuro: Alert and oriented.  Generalized weakness.  No focal deficits.    Data Reviewed: I have personally reviewed following labs and imaging studies:  CBC: Recent Labs  Lab 12/01/20 1802 12/02/20 0934 12/04/20 0734  WBC 7.1 7.8 5.3  NEUTROABS 5.9  --   --   HGB 11.4* 11.5* 10.6*  HCT 38.1 37.2 33.7*  MCV 89.0 86.7 87.1  PLT 277 260 702   Basic Metabolic Panel: Recent Labs  Lab 12/01/20 1802 12/02/20 0934 12/03/20 0249 12/04/20 0734 12/06/20 0334  NA 137 137 140 140 140  K 5.1 4.9 3.8  3.9 3.1*  CL 109 108 112* 109 106  CO2 18* 17* 19* 24 26  GLUCOSE 116* 108* 127* 82 101*  BUN 66* 65* 60* 47* 42*  CREATININE 6.19* 5.17* 4.43* 2.85* 2.12*  CALCIUM 9.5 9.5 9.0 8.8* 8.5*  PHOS  --   --   --  2.8  --    GFR: Estimated Creatinine Clearance: 20.4 mL/min (A) (by C-G formula based on SCr of 2.12 mg/dL (H)). Liver Function Tests: Recent Labs  Lab 12/01/20 1802 12/04/20 0734  AST 14*  --   ALT 14  --   ALKPHOS 66  --   BILITOT 0.6  --   PROT 7.0  --   ALBUMIN 3.2* 2.4*   No results for input(s): LIPASE, AMYLASE in the last 168 hours. No results for input(s): AMMONIA in the last 168 hours. Coagulation Profile: No results for input(s): INR, PROTIME in the last 168 hours. Cardiac Enzymes: No results for input(s): CKTOTAL, CKMB, CKMBINDEX, TROPONINI in the last 168 hours. BNP (last 3 results) No results for input(s): PROBNP in the last 8760 hours. HbA1C: No results for input(s): HGBA1C in the last 72 hours. CBG: Recent Labs  Lab 12/05/20 1124 12/05/20 1609 12/05/20 2136 12/06/20 0614 12/06/20 1125  GLUCAP 120* 135* 136* 81 125*   Lipid Profile: No results for input(s): CHOL, HDL, LDLCALC, TRIG, CHOLHDL, LDLDIRECT in the last 72 hours. Thyroid Function Tests: No results for input(s): TSH, T4TOTAL, FREET4, T3FREE, THYROIDAB in the last 72 hours. Anemia Panel: No results for input(s): VITAMINB12, FOLATE, FERRITIN, TIBC, IRON, RETICCTPCT in the last 72 hours. Urine analysis:    Component Value Date/Time   COLORURINE YELLOW 12/02/2020 1316   APPEARANCEUR HAZY (A) 12/02/2020 1316   APPEARANCEUR Clear 08/22/2018 1620   LABSPEC 1.009 12/02/2020 1316   LABSPEC 1.020 03/03/2014 1341   PHURINE 5.0 12/02/2020 1316   GLUCOSEU NEGATIVE 12/02/2020 1316   GLUCOSEU Negative 03/03/2014 1341   HGBUR NEGATIVE 12/02/2020 1316   BILIRUBINUR NEGATIVE 12/02/2020 1316   BILIRUBINUR Negative 08/22/2018 1620   BILIRUBINUR Negative 03/03/2014 1341   KETONESUR NEGATIVE  12/02/2020 1316   PROTEINUR NEGATIVE 12/02/2020 1316   UROBILINOGEN 0.2 03/03/2014 1341   NITRITE NEGATIVE 12/02/2020 1316   LEUKOCYTESUR NEGATIVE 12/02/2020 1316   LEUKOCYTESUR Negative 03/03/2014 1341   Sepsis Labs: @LABRCNTIP (procalcitonin:4,lacticacidven:4)  ) Recent Results (from the past 240 hour(s))  SARS CORONAVIRUS 2 (TAT 6-24 HRS) Nasopharyngeal Nasopharyngeal Swab     Status: None   Collection Time: 12/02/20  4:29 AM   Specimen: Nasopharyngeal Swab  Result Value Ref Range Status   SARS Coronavirus 2 NEGATIVE NEGATIVE Final    Comment: (NOTE) SARS-CoV-2 target nucleic acids are NOT DETECTED.  The SARS-CoV-2 RNA is generally detectable in upper and lower respiratory specimens during the acute phase of infection. Negative results do not preclude SARS-CoV-2 infection, do  not rule out co-infections with other pathogens, and should not be used as the sole basis for treatment or other patient management decisions. Negative results must be combined with clinical observations, patient history, and epidemiological information. The expected result is Negative.  Fact Sheet for Patients: SugarRoll.be  Fact Sheet for Healthcare Providers: https://www.woods-mathews.com/  This test is not yet approved or cleared by the [place] FDA and  has been authorized for detection and/or diagnosis of SARS-CoV-2 by FDA under an Emergency Use Authorization (EUA). This EUA will remain  in effect (meaning this test can be used) for the duration of the COVID-19 declaration under Se ction 564(b)(1) of the Act, 21 U.S.C. section 360bbb-3(b)(1), unless the authorization is terminated or revoked sooner.  Performed at [facility], [place] [address] [place], [place] [address]          Radiology Studies: No results found.      Scheduled Meds:  apixaban  2.5 mg Oral BID   budesonide  3 mg Oral Daily   FLUoxetine  20 mg Oral QHS    insulin aspart  0-9 Units Subcutaneous TID WC   lipase/protease/amylase  36,000 Units Oral TID WC   mirtazapine  7.5 mg Oral QHS   pantoprazole  40 mg Oral QAC breakfast   potassium chloride  40 mEq Oral BID   rosuvastatin  40 mg Oral Daily   sodium bicarbonate  1,300 mg Oral TID   sodium chloride flush  3 mL Intravenous Q12H   sucralfate  1 g Oral TID WC & HS   Continuous Infusions:     LOS: 4 days    Time spent: 25 minutes    Barb Merino, MD [facility] 12/06/2020, 1:11 PM

## 2020-12-06 NOTE — Progress Notes (Signed)
Inpatient Rehabilitation Admissions Coordinator   I met with granddaughter and patient at bedside with daughter via phone. I await insurance Auth for a possible CIR admit.   , RN, MSN Rehab Admissions Coordinator [phone] 12/06/2020 1:04 PM  

## 2020-12-06 NOTE — Progress Notes (Addendum)
HEMATOLOGY-ONCOLOGY PROGRESS NOTE  SUBJECTIVE: [name] is a 75 year old female who is followed by our office for anemia of chronic disease, iron deficiency anemia, and vitamin B12 deficiency.  She also has a history of breast cancer and history of renal vein thrombosis and right lower extremity DVT.  She presented to the emergency department with weakness and was noted to have worsening renal function.  She had one fall at home. On admission, her creatinine was greater than 6.  She has received gentle IV fluids and creatinine has improved.  Nephrology following and recommends conservative management.  She remains on low-dose apixaban for her history of renal vein thrombosis and right lower extremity DVT.  We last saw the patient on 11/17/2020 during her last hospitalization.  She was scheduled to follow-up with [place] on 12/02/2020 but her appointment was canceled secondary to hospitalization.  She was given a vitamin B12 injection on 11/17/2020 as well as Aranesp.  Her hemoglobin as of 12/04/2020 was 10.6.  The patient reports that she is feeling much better.  She is hopeful to be discharged home tomorrow.  She offers no specific complaints today.  Oncology History Overview Note  Cancer Staging Breast cancer of lower-inner quadrant of right female breast [facility]) Staging form: Breast, AJCC 7th Edition - Clinical: Stage IA (T1b, N0, cM0) - Unsigned Staging comments: Staged at breast conference 03/25/13.  - Pathologic stage from 05/12/2013: Stage IA (T1b, N0, cM0) - Signed by Truitt Merle, MD on 06/21/2016     Breast cancer of lower-inner quadrant of right female breast ([place])  02/27/2013 Mammogram   Diagnostic mammogram and ultrasound showed a 6 x 5 mm mass at 5:00 position. Axilla was negative by ultrasound.   03/16/2013 Initial Biopsy   Right breast 5:00 position biopsy showed invasive ductal carcinoma, low-grade   03/16/2013 Receptors her2   ER100% positive, PR 100% positive, HER-2 negative, Ki-67 20%    03/18/2013 Initial Diagnosis   Breast cancer of lower-inner quadrant of right female breast ([place])   05/12/2013 Surgery   Right breast lumpectomy with sentinel lymph node biopsy   05/12/2013 Pathology Results   Right breast lumpectomy showed invasive ductal carcinoma, grade 2, 0.9 cm, 6 sentinel lymph nodes were all negative. Margins were negative.   05/12/2013 Oncotype testing   Oncotype recurrence score 19, intermediate risk, 10 year risk of distant recurrence 12% with tamoxifen for 5 years. Patient declined adjuvant chemotherapy.   06/02/2013 - 07/16/2013 Radiation Therapy   Adjuvant breast radiation with boost 1600Gy   08/06/2013 - 07/2017 Anti-estrogen oral therapy   Arimidex 1 mg daily. Stopped in 07/2017 due to acute illness    03/22/2017 Mammogram   IMPRESSION: No mammographic evidence for malignancy.   Iron deficiency anemia     REVIEW OF SYSTEMS:   Constitutional: Denies fevers, chills Eyes: Denies blurriness of vision Ears, nose, mouth, throat, and face: Denies mucositis or sore throat Respiratory: Denies cough, dyspnea or wheezes Cardiovascular: Denies palpitation, chest discomfort Gastrointestinal:  Denies nausea, heartburn or change in bowel habits Skin: Denies abnormal skin rashes Lymphatics: Denies new lymphadenopathy or easy bruising Neurological:Denies numbness, tingling or new weaknesses Behavioral/Psych: Mood is stable, no new changes  Extremities: No lower extremity edema All other systems were reviewed with the patient and are negative.  I have reviewed the past medical history, past surgical history, social history and family history with the patient and they are unchanged from previous note.   PHYSICAL EXAMINATION: ECOG PERFORMANCE STATUS: 1 - Symptomatic but completely ambulatory  Vitals:   12/06/20 0505 12/06/20 1204  BP: (!) 162/77 125/72  Pulse: 75 80  Resp: 18 18  Temp: 98.2 F (36.8 C) 98.3 F (36.8 C)  SpO2: 100% 98%   Filed Weights    12/04/20 0519 12/05/20 0457 12/06/20 0505  Weight: 59 kg 59.1 kg 59.2 kg    Intake/Output from previous day: 09/19 0701 - 09/20 0700 In: 54 [P.O.:690] Out: 200 [Urine:200]  GENERAL:alert, no distress and comfortable SKIN: skin color, texture, turgor are normal, no rashes or significant lesions EYES: normal, Conjunctiva are pink and non-injected, sclera clear LUNGS: clear to auscultation and percussion with normal breathing effort HEART: regular rate & rhythm and no murmurs and no lower extremity edema ABDOMEN:abdomen soft, non-tender and normal bowel sounds NEURO: alert & oriented x 3 with fluent speech, no focal motor/sensory deficits  LABORATORY DATA:  I have reviewed the data as listed CMP Latest Ref Rng & Units 12/06/2020 12/04/2020 12/03/2020  Glucose 70 - 99 mg/dL 101(H) 82 127(H)  BUN 8 - 23 mg/dL 42(H) 47(H) 60(H)  Creatinine 0.44 - 1.00 mg/dL 2.12(H) 2.85(H) 4.43(H)  Sodium 135 - 145 mmol/L 140 140 140  Potassium 3.5 - 5.1 mmol/L 3.1(L) 3.9 3.8  Chloride 98 - 111 mmol/L 106 109 112(H)  CO2 22 - 32 mmol/L 26 24 19(L)  Calcium 8.9 - 10.3 mg/dL 8.5(L) 8.8(L) 9.0  Total Protein 6.5 - 8.1 g/dL - - -  Total Bilirubin 0.3 - 1.2 mg/dL - - -  Alkaline Phos 38 - 126 U/L - - -  AST 15 - 41 U/L - - -  ALT 0 - 44 U/L - - -    Lab Results  Component Value Date   WBC 5.3 12/04/2020   HGB 10.6 (L) 12/04/2020   HCT 33.7 (L) 12/04/2020   MCV 87.1 12/04/2020   PLT 209 12/04/2020   NEUTROABS 5.9 12/01/2020    DG Chest 2 View  Result Date: 11/15/2020 CLINICAL DATA:  weakness EXAM: CHEST - 2 VIEW COMPARISON:  None. FINDINGS: Vascular stent overlying the heart. The cardiomediastinal silhouette is within normal limits. No pleural effusion. No pneumothorax. No mass or consolidation. No acute osseous abnormality. Right axillary surgical clips. IMPRESSION: No acute abnormality in the chest. Electronically Signed   By: Albin Felling M.D.   On: 11/15/2020 14:38   DG Abd 1 View  Result  Date: 12/01/2020 CLINICAL DATA:  Loose stools, constipation intermittent diarrhea as well in a 75 year old female. EXAM: ABDOMEN - 1 VIEW COMPARISON:  June 08, 2020 FINDINGS: Incidentally imaged lung bases are unremarkable. Changes of cholecystectomy. Bowel gas pattern without signs of obstruction. No free air beneath either RIGHT or LEFT hemidiaphragm. No substantial volume of stool visualized in the colon. No sign of organomegaly. Degenerative changes of the spine greatest in the lower lumbar spine. No gross acute abnormality of regional skeletal structures. IMPRESSION: Nonobstructive bowel gas pattern. No substantial volume of stool visualized in the colon. Considerable stool volume seen on prior exam is no longer evident. Electronically Signed   By: Zetta Bills M.D.   On: 12/01/2020 09:42   CT HEAD WO CONTRAST (5MM)  Result Date: 12/01/2020 CLINICAL DATA:  Fall, head trauma, right eye pain, on Eliquis EXAM: CT HEAD WITHOUT CONTRAST TECHNIQUE: Contiguous axial images were obtained from the base of the skull through the vertex without intravenous contrast. COMPARISON:  08/09/2017 FINDINGS: Brain: No evidence of acute infarction, hemorrhage, hydrocephalus, extra-axial collection or mass lesion/mass effect. Subcortical white matter and periventricular small vessel  ischemic changes. Left basal ganglia lacunar infarct. Septum cavum pellucidum et vergae. Vascular: Intracranial atherosclerosis. Skull: Normal. Negative for fracture or focal lesion. Sinuses/Orbits: Partial opacification of the left maxillary sinus. Visualized paranasal sinuses and mastoid air cells are otherwise clear. Other: None. IMPRESSION: No evidence of acute intracranial abnormality. Left basal ganglia lacunar infarct. Small vessel ischemic changes. Electronically Signed   By: Julian Hy M.D.   On: 12/01/2020 19:08   MR BRAIN WO CONTRAST  Result Date: 12/02/2020 CLINICAL DATA:  Stroke, follow-up EXAM: MRI HEAD WITHOUT CONTRAST  TECHNIQUE: Multiplanar, multiecho pulse sequences of the brain and surrounding structures were obtained without intravenous contrast. COMPARISON:  None. FINDINGS: Brain: There is no acute infarction or intracranial hemorrhage. There is no intracranial mass, mass effect, or edema. There is no hydrocephalus or extra-axial fluid collection. Cavum septum pellucidum et vergae is noted incidentally. Prominence of the ventricles and sulci reflects parenchymal volume loss. Chronic small vessel infarcts of the left basal ganglia and adjacent white matter as well as bilateral corona radiata. Additional patchy T2 hyperintensity in the supratentorial and pontine white matter is nonspecific but may reflect mild chronic microvascular ischemic changes. Punctate focus of susceptibility in the right occipital lobe likely reflects chronic microhemorrhage. Vascular: Major vessel flow voids at the skull base are preserved. Skull and upper cervical spine: Normal marrow signal is preserved. Sinuses/Orbits: Circumferential left maxillary sinus mucosal thickening. Bilateral lens replacements. Other: Sella is unremarkable.  Mastoid air cells are clear. IMPRESSION: No evidence of recent infarction, hemorrhage, or mass. Mild chronic microvascular ischemic changes. Few small chronic small vessel infarcts. Electronically Signed   By: Macy Mis M.D.   On: 12/02/2020 18:14   NM Pulmonary Perfusion  Result Date: 11/16/2020 CLINICAL DATA:  Nonspecific chest pain EXAM: NUCLEAR MEDICINE PERFUSION LUNG SCAN TECHNIQUE: Perfusion images were obtained in multiple projections after intravenous injection of radiopharmaceutical. Ventilation scans intentionally deferred if perfusion scan and chest x-ray adequate for interpretation during COVID 19 epidemic. RADIOPHARMACEUTICALS:  4.3 mCi Tc-33mMAA IV COMPARISON:  06/29/2020 FINDINGS: Slight irregularity of perfusion in the peripheral aspect of the RIGHT lung unchanged, in a nonsegmental pattern. No  segmental or subsegmental perfusion defects identified. IMPRESSION: No evidence of pulmonary embolism. Stable exam versus 06/29/2020 Electronically Signed   By: MLavonia DanaM.D.   On: 11/16/2020 11:33   [place]  Result Date: 12/02/2020 CLINICAL DATA:  Chronic kidney disease EXAM: RENAL / URINARY TRACT ULTRASOUND COMPLETE COMPARISON:  09/24/2020 FINDINGS: Right Kidney: Renal measurements: 9.6 x 5.6 x 4.1 cm = volume: 48 mL. Echogenic renal parenchyma. 1.8 x 1.7 x 1.6 cm upper pole cyst, simple. No hydronephrosis. Left Kidney: Renal measurements: 10.2 x 5.2 x 3.9 cm = volume: 110 mL. Echogenic renal parenchyma. 1.6 x 1.4 x 1.7 cm lower pole cyst, simple. Mild hydronephrosis. Bladder: Appears normal for degree of bladder distention. Other: None. IMPRESSION: Echogenic renal parenchyma, suggesting medical renal disease. Bilateral simple renal cysts, as above. Mild left hydronephrosis. Electronically Signed   By: SJulian HyM.D.   On: 12/02/2020 03:08   CT RENAL STONE STUDY  Result Date: 12/02/2020 CLINICAL DATA:  Left hydronephrosis seen on renal ultrasound. EXAM: CT ABDOMEN AND PELVIS WITHOUT CONTRAST TECHNIQUE: Multidetector CT imaging of the abdomen and pelvis was performed following the standard protocol without IV contrast. COMPARISON:  Renal ultrasound from same day. CT abdomen pelvis dated September 03, 2020. FINDINGS: Lower chest: No acute abnormality. Hepatobiliary: No focal liver abnormality is seen. Status post cholecystectomy. No biliary dilatation. Pancreas: Unremarkable.  No pancreatic ductal dilatation or surrounding inflammatory changes. Spleen: Normal in size without focal abnormality. Adrenals/Urinary Tract: The adrenal glands are unremarkable. Small bilateral renal cysts again noted. No renal calculi or hydronephrosis. The bladder is unremarkable. Stomach/Bowel: Unchanged small hiatal hernia. The stomach is otherwise within normal limits. Prior right ileocectomy with anastomosis in the right  lower quadrant. No bowel wall thickening, distention, or surrounding inflammatory changes. Vascular/Lymphatic: Aortic atherosclerosis. No enlarged abdominal or pelvic lymph nodes. Reproductive: Status post hysterectomy. No adnexal masses. Other: No free fluid or pneumoperitoneum. Musculoskeletal: No acute or significant osseous findings. IMPRESSION: 1. No acute intra-abdominal process. No hydronephrosis. 2. Aortic Atherosclerosis (ICD10-I70.0). Electronically Signed   By: Titus Dubin M.D.   On: 12/02/2020 16:20   VAS [place] LOWER EXTREMITY VENOUS (DVT)  Result Date: 11/16/2020  Lower Venous DVT Study Patient Name:  Reisha [name] Nordquist  Date of Exam:   11/16/2020 Medical Rec #: [id number]           Accession #:    [id number] Date of Birth: 06-20-1945           Patient Gender: F Patient Age:   63 years Exam Location[facility]  [facility] Procedure:      VAS [place] LOWER EXTREMITY VENOUS (DVT) Referring Phys: Inda Merlin --------------------------------------------------------------------------------  Indications: Edema.  Comparison Study: 06/29/20 prior Performing Technologist: Archie Patten RVS  Examination Guidelines: A complete evaluation includes B-mode imaging, spectral Doppler, color Doppler, and power Doppler as needed of all accessible portions of each vessel. Bilateral testing is considered an integral part of a complete examination. Limited examinations for reoccurring indications may be performed as noted. The reflux portion of the exam is performed with the patient in reverse Trendelenburg.  +---------+---------------+---------+-----------+----------+-------------------+ RIGHT    CompressibilityPhasicitySpontaneityPropertiesThrombus Aging      +---------+---------------+---------+-----------+----------+-------------------+ CFV      Full           Yes      Yes                                      +---------+---------------+---------+-----------+----------+-------------------+ SFJ      Full                                                              +---------+---------------+---------+-----------+----------+-------------------+ FV Prox  Full                                                             +---------+---------------+---------+-----------+----------+-------------------+ FV Mid   Full                                                             +---------+---------------+---------+-----------+----------+-------------------+ FV DistalFull                                                             +---------+---------------+---------+-----------+----------+-------------------+  PFV      Full                                                             +---------+---------------+---------+-----------+----------+-------------------+ POP      Full           Yes      Yes                                      +---------+---------------+---------+-----------+----------+-------------------+ PTV      Full                                                             +---------+---------------+---------+-----------+----------+-------------------+ PERO                                                  Not well visualized +---------+---------------+---------+-----------+----------+-------------------+   +---------+---------------+---------+-----------+----------+-------------------+ LEFT     CompressibilityPhasicitySpontaneityPropertiesThrombus Aging      +---------+---------------+---------+-----------+----------+-------------------+ CFV      Full           Yes      Yes                                      +---------+---------------+---------+-----------+----------+-------------------+ SFJ      Full                                                             +---------+---------------+---------+-----------+----------+-------------------+ FV Prox  Full                                                              +---------+---------------+---------+-----------+----------+-------------------+ FV Mid   Full                                                             +---------+---------------+---------+-----------+----------+-------------------+ FV DistalFull                                                             +---------+---------------+---------+-----------+----------+-------------------+ PFV      Full                                                             +---------+---------------+---------+-----------+----------+-------------------+  POP      Full           Yes      Yes                                      +---------+---------------+---------+-----------+----------+-------------------+ PTV      Full                                                             +---------+---------------+---------+-----------+----------+-------------------+ PERO                                                  Not well visualized +---------+---------------+---------+-----------+----------+-------------------+     Summary: BILATERAL: - No evidence of deep vein thrombosis seen in the lower extremities, bilaterally. -No evidence of popliteal cyst, bilaterally.   *See table(s) above for measurements and observations. Electronically signed by Harold Barban MD on 11/16/2020 at 6:29:59 PM.    Final     ASSESSMENT AND PLAN: 1.  Anemia due to chronic disease, iron deficiency, vitamin B12 deficiency 2.  History of left renal vein thrombosis and right lower extremity DVT 3.  Acute on chronic renal failure, improved 4.  Orthostatic hypotension, improved off blood pressure medications 5.  Chronic diastolic CHF 6.  Diabetes mellitus 7.  History of breast cancer 8.  History of anxiety/depression  -The patient's hemoglobin on 12/04/2020 was 10.6.  Previously this hospitalization her hemoglobin was around 11.5.  She received a vitamin B12 injection and Aranesp injection on 11/17/2020 when her  hemoglobin was 9.1.  We will check a CBC in the a.m. to see what her current hemoglobin is and may consider an Aranesp injection prior to hospital discharge.  We will arrange for outpatient follow-up with cancer care for close monitoring of her CBC and to administer future vitamin B12 and Aranesp/Retacrit injections. -Continue low-dose apixaban for history of left renal vein thrombosis and right lower extremity DVT.   Future Appointments  Date Time Provider [place]  12/13/2020 10:30 AM [name], RN [facility] None      LOS: 4 days   Mikey Bussing, [place], AGPCNP-BC, AOCNP 12/06/20  Addendum  I have seen the patient, examined her. I agree with the assessment and and plan and have edited the notes.   Pt was admitted for worsening renal function.  This has improved significantly.  She is feeling better, and plan to go home tomorrow.  She is due for Retacrit injection, I will check her CBC in the morning, and give Aranesp if hemoglobin less than 11.  I will schedule lab, follow-up and injection with me in 2 weeks.  Truitt Merle  12/06/2020

## 2020-12-07 DIAGNOSIS — N179 Acute kidney failure, unspecified: Secondary | ICD-10-CM | POA: Diagnosis not present

## 2020-12-07 DIAGNOSIS — N184 Chronic kidney disease, stage 4 (severe): Secondary | ICD-10-CM | POA: Diagnosis not present

## 2020-12-07 LAB — GLUCOSE, CAPILLARY
Glucose-Capillary: 95 mg/dL (ref 70–99)
Glucose-Capillary: 128 mg/dL — ABNORMAL HIGH (ref 70–99)
Glucose-Capillary: 110 mg/dL — ABNORMAL HIGH (ref 70–99)
Glucose-Capillary: 149 mg/dL — ABNORMAL HIGH (ref 70–99)

## 2020-12-07 LAB — CBC WITH DIFFERENTIAL/PLATELET
Abs Immature Granulocytes: 0.03 10*3/uL (ref 0.00–0.07)
Basophils Absolute: 0 10*3/uL (ref 0.0–0.1)
Basophils Relative: 0 %
Eosinophils Absolute: 0 10*3/uL (ref 0.0–0.5)
Eosinophils Relative: 0 %
HCT: 31.4 % — ABNORMAL LOW (ref 36.0–46.0)
Hemoglobin: 9.7 g/dL — ABNORMAL LOW (ref 12.0–15.0)
Immature Granulocytes: 1 %
Lymphocytes Relative: 23 %
Lymphs Abs: 1.2 10*3/uL (ref 0.7–4.0)
MCH: 27.1 pg (ref 26.0–34.0)
MCHC: 30.9 g/dL (ref 30.0–36.0)
MCV: 87.7 fL (ref 80.0–100.0)
Monocytes Absolute: 0.4 10*3/uL (ref 0.1–1.0)
Monocytes Relative: 8 %
Neutro Abs: 3.7 10*3/uL (ref 1.7–7.7)
Neutrophils Relative %: 68 %
Platelets: 168 10*3/uL (ref 150–400)
RBC: 3.58 MIL/uL — ABNORMAL LOW (ref 3.87–5.11)
RDW: 20.4 % — ABNORMAL HIGH (ref 11.5–15.5)
WBC: 5.4 10*3/uL (ref 4.0–10.5)
nRBC: 0 % (ref 0.0–0.2)

## 2020-12-07 NOTE — Progress Notes (Signed)
PROGRESS NOTE    [name]  XKP:[id number] DOB: 1945-09-26 DOA: 12/01/2020 PCP: Seward Carol, MD    Brief Narrative:  Multiple issues admitted with weakness and worsening renal functions with creatinine of 6.   Assessment & Plan:   Principal Problem:   Acute renal failure superimposed on stage 4 chronic kidney disease (HCC) Active Problems:   GERD without esophagitis   Coronary artery disease involving native coronary artery of native heart   Anemia in chronic kidney disease   Type 2 diabetes mellitus with stage 4 chronic kidney disease, without long-term current use of insulin (HCC)   Essential hypertension   Generalized weakness   (HFpEF) heart failure with preserved ejection fraction (HCC)   Acute renal failure (ARF) ([place])  Medical issues improved.  Waiting for rehab.  Medical issues improved.   Stable.  DVT prophylaxis: apixaban (ELIQUIS) tablet 2.5 mg Start: 12/02/20 1000 apixaban (ELIQUIS) tablet 2.5 mg   Code Status: Full code Family Communication: Daughter Ms [name] on the phone Disposition Plan: Status is: Inpatient  Remains inpatient appropriate because:Unsafe d/c plan  Dispo: The patient is from: Home              Anticipated d/c is to: CIR              Patient currently is medically stable to d/c.   Difficult to place patient No         Consultants:  Nephrology, signed off  Procedures:  None  Antimicrobials:  None   Subjective: No complaints.  No new events.  Objective: Vitals:   12/06/20 1204 12/06/20 1652 12/07/20 0018 12/07/20 0512  BP: 125/72 114/82 (!) 171/81 (!) 172/79  Pulse: 80 80 81 78  Resp: 18 18 18 18   Temp: 98.3 F (36.8 C) 98 F (36.7 C) 98.1 F (36.7 C) 97.9 F (36.6 C)  TempSrc: Oral Oral Oral Oral  SpO2: 98% 100% 99% 100%  Weight:    59.6 kg  Height:        Intake/Output Summary (Last 24 hours) at 12/07/2020 1054 Last data filed at 12/06/2020 1421 Gross per 24 hour  Intake 237 ml  Output --  Net  237 ml   Filed Weights   12/05/20 0457 12/06/20 0505 12/07/20 0512  Weight: 59.1 kg 59.2 kg 59.6 kg    Examination:  Looks comfortable.  Pleasant.  On room air.    Data Reviewed: I have personally reviewed following labs and imaging studies  CBC: Recent Labs  Lab 12/01/20 1802 12/02/20 0934 12/04/20 0734 12/07/20 0301  WBC 7.1 7.8 5.3 5.4  NEUTROABS 5.9  --   --  3.7  HGB 11.4* 11.5* 10.6* 9.7*  HCT 38.1 37.2 33.7* 31.4*  MCV 89.0 86.7 87.1 87.7  PLT 277 260 209 867   Basic Metabolic Panel: Recent Labs  Lab 12/01/20 1802 12/02/20 0934 12/03/20 0249 12/04/20 0734 12/06/20 0334  NA 137 137 140 140 140  K 5.1 4.9 3.8 3.9 3.1*  CL 109 108 112* 109 106  CO2 18* 17* 19* 24 26  GLUCOSE 116* 108* 127* 82 101*  BUN 66* 65* 60* 47* 42*  CREATININE 6.19* 5.17* 4.43* 2.85* 2.12*  CALCIUM 9.5 9.5 9.0 8.8* 8.5*  PHOS  --   --   --  2.8  --    GFR: Estimated Creatinine Clearance: 20.4 mL/min (A) (by C-G formula based on SCr of 2.12 mg/dL (H)). Liver Function Tests: Recent Labs  Lab 12/01/20 1802 12/04/20 0734  AST  14*  --   ALT 14  --   ALKPHOS 66  --   BILITOT 0.6  --   PROT 7.0  --   ALBUMIN 3.2* 2.4*   No results for input(s): LIPASE, AMYLASE in the last 168 hours. No results for input(s): AMMONIA in the last 168 hours. Coagulation Profile: No results for input(s): INR, PROTIME in the last 168 hours. Cardiac Enzymes: No results for input(s): CKTOTAL, CKMB, CKMBINDEX, TROPONINI in the last 168 hours. BNP (last 3 results) No results for input(s): PROBNP in the last 8760 hours. HbA1C: No results for input(s): HGBA1C in the last 72 hours. CBG: Recent Labs  Lab 12/06/20 0614 12/06/20 1125 12/06/20 1604 12/06/20 2126 12/07/20 0604  GLUCAP 81 125* 128* 138* 95   Lipid Profile: No results for input(s): CHOL, HDL, LDLCALC, TRIG, CHOLHDL, LDLDIRECT in the last 72 hours. Thyroid Function Tests: No results for input(s): TSH, T4TOTAL, FREET4, T3FREE,  THYROIDAB in the last 72 hours. Anemia Panel: No results for input(s): VITAMINB12, FOLATE, FERRITIN, TIBC, IRON, RETICCTPCT in the last 72 hours. Sepsis Labs: No results for input(s): PROCALCITON, LATICACIDVEN in the last 168 hours.  Recent Results (from the past 240 hour(s))  SARS CORONAVIRUS 2 (TAT 6-24 HRS) Nasopharyngeal Nasopharyngeal Swab     Status: None   Collection Time: 12/02/20  4:29 AM   Specimen: Nasopharyngeal Swab  Result Value Ref Range Status   SARS Coronavirus 2 NEGATIVE NEGATIVE Final    Comment: (NOTE) SARS-CoV-2 target nucleic acids are NOT DETECTED.  The SARS-CoV-2 RNA is generally detectable in upper and lower respiratory specimens during the acute phase of infection. Negative results do not preclude SARS-CoV-2 infection, do not rule out co-infections with other pathogens, and should not be used as the sole basis for treatment or other patient management decisions. Negative results must be combined with clinical observations, patient history, and epidemiological information. The expected result is Negative.  Fact Sheet for Patients: SugarRoll.be  Fact Sheet for Healthcare Providers: https://www.woods-mathews.com/  This test is not yet approved or cleared by the [place] FDA and  has been authorized for detection and/or diagnosis of SARS-CoV-2 by FDA under an Emergency Use Authorization (EUA). This EUA will remain  in effect (meaning this test can be used) for the duration of the COVID-19 declaration under Se ction 564(b)(1) of the Act, 21 U.S.C. section 360bbb-3(b)(1), unless the authorization is terminated or revoked sooner.  Performed at [facility], [place] [address] [place], [place] [address]          Radiology Studies: No results found.      Scheduled Meds:  apixaban  2.5 mg Oral BID   budesonide  3 mg Oral Daily   FLUoxetine  20 mg Oral QHS   insulin aspart  0-9 Units Subcutaneous  TID WC   lipase/protease/amylase  36,000 Units Oral TID WC   mirtazapine  7.5 mg Oral QHS   pantoprazole  40 mg Oral QAC breakfast   potassium chloride  40 mEq Oral BID   rosuvastatin  40 mg Oral Daily   sodium bicarbonate  1,300 mg Oral TID   sodium chloride flush  3 mL Intravenous Q12H   sucralfate  1 g Oral TID WC & HS   Continuous Infusions:   LOS: 5 days    Time spent: 15 minutes    Barb Merino, MD [facility] Pager [phone]

## 2020-12-07 NOTE — NC FL2 (Signed)
[place] LEVEL OF CARE SCREENING TOOL     IDENTIFICATION  Patient Name: [name] Birthdate: 14-Dec-1945 Sex: female Admission Date (Current Location): 12/01/2020  [facility] [place] Number:  Herbalist and Address:  [facility] [place], [place] [address], [place], [place] [address]      Provider Number: [id number]  Attending Physician Name and Address:  Barb Merino, MD  Relative Name and Phone Number:       Current Level of Care: Hospital Recommended Level of Care: [place] Prior Approval Number:    Date Approved/Denied:   PASRR Number: [id number]  Discharge Plan: SNF    Current Diagnoses: Patient Active Problem List   Diagnosis Date Noted   Generalized weakness 12/02/2020   (HFpEF) heart failure with preserved ejection fraction ([place]) 12/02/2020   Acute renal failure (ARF) ([place]) 12/02/2020   Shortness of breath 11/15/2020   Elevated troponin 11/15/2020   CKD (chronic kidney disease), stage IV ([place]) 11/15/2020   Unintentional weight loss 11/15/2020   COVID-19 11/02/2020   Malnutrition of moderate degree 09/27/2020   Transaminitis 09/23/2020   Acute renal failure superimposed on stage 4 chronic kidney disease ([place]) 09/03/2020   Chronic diastolic CHF (congestive heart failure) ([place]) 09/03/2020   CHF (congestive heart failure) ([place]) 07/20/2020   Dyspnea 06/28/2020   Chronic diarrhea - responds to budesonide 09/10/2019   Iron deficiency anemia 08/19/2018   Spondylosis of lumbar spine 12/24/2017   Renal vein thrombosis ([place]) 09/17/2017   Hoarse voice quality 09/09/2017   Low back pain without sciatica 09/09/2017   AKI (acute kidney injury) ([place]) 08/25/2017   Labile blood glucose    Sleep disturbance    Leukocytosis    Essential hypertension    Urinary frequency    Type 2 diabetes mellitus with stage 4 chronic kidney disease, without long-term current use of insulin (HCC)    Benign essential HTN     Stage 2 chronic kidney disease    Dysphagia    Debility    Encephalopathy    Pressure injury of skin 08/04/2017   Pneumonia of left lung due to infectious organism    Diffuse pulmonary alveolar hemorrhage    SOB (shortness of breath) 10/11/2016   Hyperparathyroidism, secondary renal ([place]) 05/02/2016   Gout 04/11/2015   Diabetic neuropathy associated with type 2 diabetes mellitus ([place]) 01/06/2015   SBO (small bowel obstruction) ([place]) 09/15/2013   Breast cancer of lower-inner quadrant of right female breast ([place]) 03/18/2013   Chronic venous insufficiency 01/06/2013   Health care maintenance 05/08/2011   Diabetes mellitus type 2 in obese ([place]) 05/03/2010   Seasonal allergies 05/03/2010   Labile hypertension    Coronary artery disease involving native coronary artery of native heart    Abdominal discomfort    Anemia in chronic kidney disease    History of stroke without residual deficits    Hyperlipidemia associated with type 2 diabetes mellitus (HCC)    Chronic pain    Chronic kidney disease, stage III (moderate) (HCC)    Adnexal mass    GERD without esophagitis 01/19/2009   Spinal stenosis, lumbar region, with neurogenic claudication 01/03/2009    Orientation RESPIRATION BLADDER Height & Weight     Self, Situation, Place, Time  Normal Continent Weight: 131 lb 8 oz (59.6 kg) Height:  5' 4"  (162.6 cm)  BEHAVIORAL SYMPTOMS/MOOD NEUROLOGICAL BOWEL NUTRITION STATUS      Continent Diet (See DC Summary)  AMBULATORY STATUS COMMUNICATION OF NEEDS Skin  Supervision Verbally                         Personal Care Assistance Level of Assistance  Feeding, Bathing, Dressing Bathing Assistance: Limited assistance Feeding assistance: Independent Dressing Assistance: Limited assistance     Functional Limitations Info  Hearing, Sight, Speech Sight Info: Adequate Hearing Info: Adequate Speech Info: Adequate    SPECIAL CARE FACTORS FREQUENCY  PT (By licensed PT), OT (By  licensed OT)     PT Frequency: 5x a week OT Frequency: 5x a week            Contractures Contractures Info: Not present    Additional Factors Info  Code Status, Allergies Code Status Info: Full Allergies Info: Percocet (Oxycodone-acetaminophen)   Diazepam   Haloperidol Lactate   Lorazepam   Morphine Sulfate   Propoxyphene Hcl   Tramadol Hcl   Anesthetics, Halogenated   Etomidate   Fentanyl   Haloperidol   Morphine Sulfate   Oxycodone-acetaminophen   Propoxyphene   Tramadol Hcl   Versed (Midazolam)           Current Medications (12/07/2020):  This is the current hospital active medication list Current Facility-Administered Medications  Medication Dose Route Frequency Provider Last Rate Last Admin   acetaminophen (TYLENOL) tablet 650 mg  650 mg Oral Q6H PRN Lenore Cordia, MD   650 mg at 12/07/20 1029   Or   acetaminophen (TYLENOL) suppository 650 mg  650 mg Rectal Q6H PRN Lenore Cordia, MD       apixaban (ELIQUIS) tablet 2.5 mg  2.5 mg Oral BID Zada Finders R, MD   2.5 mg at 12/07/20 0850   budesonide (ENTOCORT EC) 24 hr capsule 3 mg  3 mg Oral Daily Zada Finders R, MD   3 mg at 12/07/20 0850   FLUoxetine (PROZAC) capsule 20 mg  20 mg Oral QHS Zada Finders R, MD   20 mg at 12/06/20 2156   insulin aspart (novoLOG) injection 0-9 Units  0-9 Units Subcutaneous TID WC Lenore Cordia, MD   1 Units at 12/07/20 1137   lipase/protease/amylase (CREON) capsule 36,000 Units  36,000 Units Oral TID WC Lenore Cordia, MD   36,000 Units at 12/07/20 1137   loperamide (IMODIUM) capsule 4 mg  4 mg Oral QID PRN Lenore Cordia, MD       melatonin tablet 5 mg  5 mg Oral QHS PRN Zada Finders R, MD   5 mg at 12/03/20 2046   mirtazapine (REMERON) tablet 7.5 mg  7.5 mg Oral QHS Zada Finders R, MD   7.5 mg at 12/06/20 2155   ondansetron (ZOFRAN) tablet 4 mg  4 mg Oral Q6H PRN Lenore Cordia, MD       Or   ondansetron (ZOFRAN) injection 4 mg  4 mg Intravenous Q6H PRN Lenore Cordia, MD        pantoprazole (PROTONIX) EC tablet 40 mg  40 mg Oral QAC breakfast Zada Finders R, MD   40 mg at 12/07/20 0850   potassium chloride SA (KLOR-CON) CR tablet 40 mEq  40 mEq Oral BID Barb Merino, MD   40 mEq at 12/07/20 0850   rosuvastatin (CRESTOR) tablet 40 mg  40 mg Oral Daily Zada Finders R, MD   40 mg at 12/07/20 0849   sodium bicarbonate tablet 1,300 mg  1,300 mg Oral TID Gean Quint, MD   1,300 mg at 12/07/20 0850   sodium  chloride flush (NS) 0.9 % injection 3 mL  3 mL Intravenous Q12H Zada Finders R, MD   3 mL at 12/07/20 0853   sucralfate (CARAFATE) tablet 1 g  1 g Oral TID WC & HS Lenore Cordia, MD   1 g at 12/07/20 1137     Discharge Medications: Please see discharge summary for a list of discharge medications.  Relevant Imaging Results:  Relevant Lab Results:   Additional Information SS#:  [id number]  Emeterio Reeve, LCSW

## 2020-12-07 NOTE — Progress Notes (Addendum)
Inpatient Rehabilitation Admissions Coordinator   I spoke with daughter, [name], by phone to inform her that insurance denied CIR admit for she is not in need of hospital level medical oversight and she is at supervision level with ambulation. [name] states her Mom does what she should do when hospitalized, but does not for family at home which results in frequent falls and admissions. I have alerted acute team and TOC. We will sign off at this time.  Danne Baxter, RN, MSN Rehab Admissions Coordinator [phone] 12/07/2020 1:32 PM  I received call from daughter, [name] and I explained denial with no reason to appeal their determination. She would like to purse SNF, not home with [facility]. I will notify TOC.  Danne Baxter, RN, MSN Rehab Admissions Coordinator [phone] 12/07/2020 4:32 PM

## 2020-12-07 NOTE — Progress Notes (Signed)
Called [facility] for peer to peer review 1255 PM, 9/21. Discussed with Dr Helane Rima. She determines that patient does not have medical complexity to need Physician to see her every day. PT/OT can be done at the same intensity at SNF and recommended to submit for SNF.

## 2020-12-07 NOTE — Progress Notes (Addendum)
Physical Therapy Treatment Patient Details Name: [name] MRN: [id number] DOB: Mar 05, 1946 Today's Date: 12/07/2020   History of Present Illness Pt is 75 yo female [facility] ED [facility] after a fall. Also found to have ARF. Imaging showed Left basal ganglia  lacunar infarct.  PMH: recent COVID, CAD, CHF, R breast CA s/p lumpectomy, anemia, DM, HTN.    PT Comments    Pt progressing well towards her physical therapy goals; demonstrating improved activity tolerance and ambulation distance. Session focused on dynamic standing balance with reaching task (pt able to attend and correctly locate objects in all visual fields) and functional mobility. Pt ambulating household distances with no assistive device at a supervision level. Able to step over obstacles and perform lateral stepping without loss of balance. However, pt does still continue to present as a higher fall risk in setting of decreased gait speed, history of falls, and orthostatic hypotension. Would benefit from follow up HHPT to address.    Recommendations for follow up therapy are one component of a multi-disciplinary discharge planning process, led by the attending physician.  Recommendations may be updated based on patient status, additional functional criteria and insurance authorization.  Follow Up Recommendations  Supervision/Assistance - 24 hour;Home health PT     Equipment Recommendations  Rolling walker with 5" wheels    Recommendations for Other Services       Precautions / Restrictions Precautions Precautions: Fall;Other (comment) Precaution Comments: monitor orthostatic BP. Granddaughter reports 3 falls since previous d/c Restrictions Weight Bearing Restrictions: No     Mobility  Bed Mobility               General bed mobility comments: OOB in recliner    Transfers Overall transfer level: Needs assistance Equipment used: None Transfers: Sit to/from Stand Sit to Stand: Supervision             Ambulation/Gait Ambulation/Gait assistance: Supervision Gait Distance (Feet): 200 Feet Assistive device: None Gait Pattern/deviations: Step-through pattern;Decreased stride length;Antalgic Gait velocity: decreased   General Gait Details: Mild antalgic gait pattern with increased distance, no gross overt LOB noted. Able to participate in side stepping, stepping over obstacles, dynamic standing balance task   Stairs             Wheelchair Mobility    Modified Rankin (Stroke Patients Only)       Balance Overall balance assessment: Needs assistance Sitting-balance support: No upper extremity supported;Feet supported Sitting balance-Leahy Scale: Normal     Standing balance support: No upper extremity supported;Bilateral upper extremity supported Standing balance-Leahy Scale: Good                              Cognition Arousal/Alertness: Awake/alert Behavior During Therapy: Flat affect Overall Cognitive Status: Impaired/Different from baseline Area of Impairment: Orientation;Attention;Memory;Safety/judgement;Awareness;Problem solving                 Orientation Level: Disoriented to;Situation Current Attention Level: Selective Memory: Decreased short-term memory   Safety/Judgement: Decreased awareness of safety;Decreased awareness of deficits Awareness: Emergent Problem Solving: Requires verbal cues General Comments: Pt with flat affect with therapist, however, more engaged with family member in room and cracking jokes      Exercises Other Exercises Other Exercises: Bilateral side stepping x 20 ft, dynamic standing reaching task in all fields    General Comments General comments (skin integrity, edema, etc.): Orthostatic vitals: Supine; 120/70 (85), Standing 73/51 (59), Post walk 123/84 (95).  Pt asymptomatic      Pertinent Vitals/Pain Pain Assessment: Faces Faces Pain Scale: Hurts a little bit Pain Location: left foot Pain  Descriptors / Indicators: Discomfort Pain Intervention(s): Monitored during session    Home Living                      Prior Function            PT Goals (current goals can now be found in the care plan section) Acute Rehab PT Goals Patient Stated Goal: Daughter is hopeful for CIR to allow pt to maximize her independence and safety with ADLs PT Goal Formulation: With patient Potential to Achieve Goals: Good    Frequency    Min 3X/week      PT Plan Discharge plan needs to be updated    Co-evaluation              AM-PAC PT "6 Clicks" Mobility   Outcome Measure  Help needed turning from your back to your side while in a flat bed without using bedrails?: None Help needed moving from lying on your back to sitting on the side of a flat bed without using bedrails?: None Help needed moving to and from a bed to a chair (including a wheelchair)?: A Little Help needed standing up from a chair using your arms (e.g., wheelchair or bedside chair)?: A Little Help needed to walk in hospital room?: A Little Help needed climbing 3-5 steps with a railing? : A Little 6 Click Score: 20    End of Session Equipment Utilized During Treatment: Gait belt Activity Tolerance: Patient tolerated treatment well Patient left: with call bell/phone within reach;in chair Nurse Communication: Mobility status       Time: 3419-3790 PT Time Calculation (min) (ACUTE ONLY): 21 min  Charges:  $Therapeutic Activity: 8-22 mins                     Wyona Almas, PT, DPT [place] Pager [phone] Office [phone]    Deno Etienne 12/07/2020, 3:06 PM

## 2020-12-08 DIAGNOSIS — N179 Acute kidney failure, unspecified: Secondary | ICD-10-CM | POA: Diagnosis not present

## 2020-12-08 DIAGNOSIS — N184 Chronic kidney disease, stage 4 (severe): Secondary | ICD-10-CM | POA: Diagnosis not present

## 2020-12-08 LAB — GLUCOSE, CAPILLARY
Glucose-Capillary: 80 mg/dL (ref 70–99)
Glucose-Capillary: 124 mg/dL — ABNORMAL HIGH (ref 70–99)
Glucose-Capillary: 103 mg/dL — ABNORMAL HIGH (ref 70–99)
Glucose-Capillary: 106 mg/dL — ABNORMAL HIGH (ref 70–99)

## 2020-12-08 NOTE — Care Management (Addendum)
NCM and SW, spoke to patient at bedside. PT recommendation is now HHPT with supervision. Patient lives with daughter, daughter works in morning and then is home. Patient agreeable to home health and states she was referral to home health but does not remember the name of the agency. Patient consented for NCM to call daughter Hattie Perch [phone].   NCM call Ms Henrene Pastor discussed above. Ms Henrene Pastor states she and her siblings do not agree with HHPT and want SNF. NCM explained recommendation had changed to HHPT and her mother ambulated 200 feet. Ms Henrene Pastor wants insurance authorization submitted for SNF. TOC team will discuss with supervisor .   Patient was set up with [facility] , however they were unable to visit patient prior to patient being readmitted to hospital.    1511 Confirmed with Stacie with [facility] patient was active with them prior to admission, will need resumption of care orders for RN,PT,OT,Aide, SW

## 2020-12-08 NOTE — Progress Notes (Signed)
PROGRESS NOTE    [name]  OQH:[id number] DOB: 10-06-1945 DOA: 12/01/2020 PCP: Katie Carol, MD      Brief Narrative:  [name] is a 75 y.o. 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 [name] recently admitted for SOB, dizziness, Imdur and amlodipine was stopped.  She was diuresed and put back on home diuretics and discharged.  At home, she was weak, had recurrent falls, and then PCP called that her renal function was worse, so she came to Katie ER.  Assessment & Plan:  Acute on chronic renal failure CKD IV Baseline Cr >2, here greater than 6 mg/dL.  No dyspnea, no uremia/confusion, normal K. - Given gentle IV fluids and kept off IV fluids now. - Creatinine continues to improve and back to her baseline.  Clinically improved. - CT scan of Katie abdomen pelvis with no evidence of hydronephrosis. - Followed by nephrology.  Recommended conservative management. _Electrolytes are adequate.  Orthostatic hypotension - Significant orthostatic hypotension but asymptomatic.  Off of all blood pressure medications.  Chronic diastolic CHF -Hold diuretics, euvolemic.  History VTE -Continue low dose apixaban  Anxiety -Continue Prozac, mirtaz  Diabetes - Continue SS corrections  Anemia of CKD Hgb stable  Coronary disease -Continue Crestor  Lacunar infarct on CT Incidental finding.  Timing unclear.  No new focal neurological symptoms. - MRI with no acute findings.  On Eliquis and a statin.  Chronic gastritis -Continue PPI, sucralfate, Creon  [name] is medically stable. Family insisted on Katie referral to CIR, insurance declined, peer to peer review done 9/21, declined. [name] with improved mobility, however continues to need rehab.  Pursuing skilled nursing facility placement.    Disposition: Status is: Inpatient  Remains inpatient  appropriate because:Ongoing diagnostic testing needed not appropriate for outpatient work up and IV treatments appropriate due to intensity of illness or inability to take PO  Dispo: [name] is from: Home              Anticipated d/c is to: SNF versus home with home health.              [name] currently is medically stable.   Difficult to place [name] No    DVT prophylaxis: apixaban (ELIQUIS) tablet 2.5 mg Start: 12/02/20 1000 apixaban (ELIQUIS) tablet 2.5 mg  Code Status: FULL Family Communication: none     Consultants:  Nephrology  Procedures:  CT head US renal CT renal MRI brain  Antimicrobials:  None.  Culture data:    Subjective: [name] seen and examined.  No overnight events.  She denies any complaints.   Objective: Vitals:   12/07/20 1827 12/07/20 2232 12/08/20 0456 12/08/20 1154  BP: (!) 108/55 (!) 174/84 (!) 159/81 108/66  Pulse: 82 80 75 85  Resp: 16 18 18 18   Temp: 98.5 F (36.9 C) 98.1 F (36.7 C) 98.4 F (36.9 C) 98.2 F (36.8 C)  TempSrc: Oral Oral Oral Oral  SpO2: 98% 100% 100% 98%  Weight:   60.3 kg   Height:        Intake/Output Summary (Last 24 hours) at 12/08/2020 1324 Last data filed at 12/08/2020 0500 Gross per 24 hour  Intake 240 ml  Output 100 ml  Net 140 ml   Filed Weights   12/06/20 0505 12/07/20 0512 12/08/20 0456  Weight: 59.2 kg 59.6 kg 60.3 kg  Examination: General: Frail and debilitated.  Comfortable.  Sitting in chair.  Without any discomfort. Cardiovascular: S1-S2 normal.  Regular rate rhythm. Respiratory: Bilateral clear.  No added sounds. Gastrointestinal: Soft.  Nontender. Ext: No cyanosis or edema. Neuro: Alert and oriented.  Generalized weakness.  No focal deficits.    Data Reviewed: I have personally reviewed following labs and imaging studies:  CBC: Recent Labs  Lab 12/01/20 1802 12/02/20 0934 12/04/20 0734 12/07/20 0301  WBC 7.1 7.8 5.3 5.4  NEUTROABS 5.9  --   --  3.7  HGB 11.4* 11.5*  10.6* 9.7*  HCT 38.1 37.2 33.7* 31.4*  MCV 89.0 86.7 87.1 87.7  PLT 277 260 209 335   Basic Metabolic Panel: Recent Labs  Lab 12/01/20 1802 12/02/20 0934 12/03/20 0249 12/04/20 0734 12/06/20 0334  NA 137 137 140 140 140  K 5.1 4.9 3.8 3.9 3.1*  CL 109 108 112* 109 106  CO2 18* 17* 19* 24 26  GLUCOSE 116* 108* 127* 82 101*  BUN 66* 65* 60* 47* 42*  CREATININE 6.19* 5.17* 4.43* 2.85* 2.12*  CALCIUM 9.5 9.5 9.0 8.8* 8.5*  PHOS  --   --   --  2.8  --    GFR: Estimated Creatinine Clearance: 20.4 mL/min (A) (by C-G formula based on SCr of 2.12 mg/dL (H)). Liver Function Tests: Recent Labs  Lab 12/01/20 1802 12/04/20 0734  AST 14*  --   ALT 14  --   ALKPHOS 66  --   BILITOT 0.6  --   PROT 7.0  --   ALBUMIN 3.2* 2.4*   No results for input(s): LIPASE, AMYLASE in Katie last 168 hours. No results for input(s): AMMONIA in Katie last 168 hours. Coagulation Profile: No results for input(s): INR, PROTIME in Katie last 168 hours. Cardiac Enzymes: No results for input(s): CKTOTAL, CKMB, CKMBINDEX, TROPONINI in Katie last 168 hours. BNP (last 3 results) No results for input(s): PROBNP in Katie last 8760 hours. HbA1C: No results for input(s): HGBA1C in Katie last 72 hours. CBG: Recent Labs  Lab 12/07/20 1104 12/07/20 1606 12/07/20 2106 12/08/20 0611 12/08/20 1058  GLUCAP 128* 110* 149* 80 124*   Lipid Profile: No results for input(s): CHOL, HDL, LDLCALC, TRIG, CHOLHDL, LDLDIRECT in Katie last 72 hours. Thyroid Function Tests: No results for input(s): TSH, T4TOTAL, FREET4, T3FREE, THYROIDAB in Katie last 72 hours. Anemia Panel: No results for input(s): VITAMINB12, FOLATE, FERRITIN, TIBC, IRON, RETICCTPCT in Katie last 72 hours. Urine analysis:    Component Value Date/Time   COLORURINE YELLOW 12/02/2020 1316   APPEARANCEUR HAZY (A) 12/02/2020 1316   APPEARANCEUR Clear 08/22/2018 1620   LABSPEC 1.009 12/02/2020 1316   LABSPEC 1.020 03/03/2014 1341   PHURINE 5.0 12/02/2020 1316    GLUCOSEU NEGATIVE 12/02/2020 1316   GLUCOSEU Negative 03/03/2014 1341   HGBUR NEGATIVE 12/02/2020 1316   BILIRUBINUR NEGATIVE 12/02/2020 1316   BILIRUBINUR Negative 08/22/2018 1620   BILIRUBINUR Negative 03/03/2014 1341   KETONESUR NEGATIVE 12/02/2020 1316   PROTEINUR NEGATIVE 12/02/2020 1316   UROBILINOGEN 0.2 03/03/2014 1341   NITRITE NEGATIVE 12/02/2020 1316   LEUKOCYTESUR NEGATIVE 12/02/2020 1316   LEUKOCYTESUR Negative 03/03/2014 1341   Sepsis Labs: @LABRCNTIP (procalcitonin:4,lacticacidven:4)  ) Recent Results (from Katie past 240 hour(s))  SARS CORONAVIRUS 2 (TAT 6-24 HRS) Nasopharyngeal Nasopharyngeal Swab     Status: None   Collection Time: 12/02/20  4:29 AM   Specimen: Nasopharyngeal Swab  Result Value Ref Range Status   SARS Coronavirus 2 NEGATIVE NEGATIVE Final  Comment: (NOTE) SARS-CoV-2 target nucleic acids are NOT DETECTED.  Katie SARS-CoV-2 RNA is generally detectable in upper and lower respiratory specimens during Katie acute phase of infection. Negative results do not preclude SARS-CoV-2 infection, do not rule out co-infections with other pathogens, and should not be used as Katie sole basis for treatment or other [name] management decisions. Negative results must be combined with clinical observations, [name] history, and epidemiological information. Katie expected result is Negative.  Fact Sheet for Patients: SugarRoll.be  Fact Sheet for Healthcare Providers: https://www.woods-mathews.com/  This test is not yet approved or cleared by Katie [place] FDA and  has been authorized for detection and/or diagnosis of SARS-CoV-2 by FDA under an Emergency Use Authorization (EUA). This EUA will remain  in effect (meaning this test can be used) for Katie duration of Katie COVID-19 declaration under Se ction 564(b)(1) of Katie Act, 21 U.S.C. section 360bbb-3(b)(1), unless Katie authorization is terminated or revoked  sooner.  Performed at [facility], [place] [address], [place], [place] [address]          Radiology Studies: No results found.      Scheduled Meds:  apixaban  2.5 mg Oral BID   budesonide  3 mg Oral Daily   FLUoxetine  20 mg Oral QHS   insulin aspart  0-9 Units Subcutaneous TID WC   lipase/protease/amylase  36,000 Units Oral TID WC   mirtazapine  7.5 mg Oral QHS   pantoprazole  40 mg Oral QAC breakfast   rosuvastatin  40 mg Oral Daily   sodium bicarbonate  1,300 mg Oral TID   sodium chloride flush  3 mL Intravenous Q12H   sucralfate  1 g Oral TID WC & HS   Continuous Infusions:     LOS: 6 days    Time spent: 20 minutes    Barb Merino, MD [facility] 12/08/2020, 1:24 PM

## 2020-12-09 DIAGNOSIS — N179 Acute kidney failure, unspecified: Secondary | ICD-10-CM | POA: Diagnosis not present

## 2020-12-09 DIAGNOSIS — N184 Chronic kidney disease, stage 4 (severe): Secondary | ICD-10-CM | POA: Diagnosis not present

## 2020-12-09 LAB — RENAL FUNCTION PANEL
Albumin: 2.8 g/dL — ABNORMAL LOW (ref 3.5–5.0)
Anion gap: 8 (ref 5–15)
BUN: 36 mg/dL — ABNORMAL HIGH (ref 8–23)
CO2: 24 mmol/L (ref 22–32)
Calcium: 9.1 mg/dL (ref 8.9–10.3)
Chloride: 108 mmol/L (ref 98–111)
Creatinine, Ser: 2.25 mg/dL — ABNORMAL HIGH (ref 0.44–1.00)
GFR, Estimated: 22 mL/min — ABNORMAL LOW (ref >60)
Glucose, Bld: 120 mg/dL — ABNORMAL HIGH (ref 70–99)
Phosphorus: 1 mg/dL — CL (ref 2.5–4.6)
Potassium: 4.6 mmol/L (ref 3.5–5.1)
Sodium: 140 mmol/L (ref 135–145)

## 2020-12-09 LAB — MAGNESIUM: Magnesium: 1.2 mg/dL — ABNORMAL LOW (ref 1.7–2.4)

## 2020-12-09 LAB — GLUCOSE, CAPILLARY
Glucose-Capillary: 74 mg/dL (ref 70–99)
Glucose-Capillary: 119 mg/dL — ABNORMAL HIGH (ref 70–99)
Glucose-Capillary: 135 mg/dL — ABNORMAL HIGH (ref 70–99)
Glucose-Capillary: 152 mg/dL — ABNORMAL HIGH (ref 70–99)

## 2020-12-09 MED ORDER — SODIUM PHOSPHATES 45 MMOLE/15ML IV SOLN
20.0000 mmol | Freq: Once | INTRAVENOUS | Status: AC
  Administered 2020-12-09: 20 mmol via INTRAVENOUS
  Filled 2020-12-09: qty 6.67

## 2020-12-09 MED ORDER — HYDRALAZINE HCL 25 MG PO TABS
25.0000 mg | ORAL_TABLET | Freq: Three times a day (TID) | ORAL | Status: DC
  Administered 2020-12-09 – 2020-12-10 (×3): 25 mg via ORAL
  Filled 2020-12-09 (×3): qty 1

## 2020-12-09 MED ORDER — MAGNESIUM SULFATE 2 GM/50ML IV SOLN
2.0000 g | Freq: Once | INTRAVENOUS | Status: AC
  Administered 2020-12-09: 2 g via INTRAVENOUS
  Filled 2020-12-09: qty 50

## 2020-12-09 MED ORDER — NEPRO/CARBSTEADY PO LIQD
237.0000 mL | Freq: Two times a day (BID) | ORAL | Status: DC
  Administered 2020-12-09: 237 mL via ORAL

## 2020-12-09 NOTE — Clinical Social Work Note (Signed)
TOC Supervisor spoke with patient daughter and explained the barriers to SNF discharge.  Patient daughter rightfully frustrated but verbalized understanding.  Agreeable to maximize home health.  RNCM to arrange resumption with [facility].  Barbette Or, [place]

## 2020-12-09 NOTE — Progress Notes (Signed)
Patient's phosphorus level is 1.0. Notified MD Berle Mull. Waiting for response or ordered.

## 2020-12-09 NOTE — Progress Notes (Addendum)
[facility] Progress Note  Patient: [name]    WEX:[id number]  DOA: 12/01/2020     Date of Service: the patient was seen and examined on 12/09/2020  Brief hospital course: Past medical history of HFpEF, VT on Eliquis, CKD 4, breast cancer, type II DM, HTN, CAD, recent COVID.  Presents with complaints of fall and weakness.  Had acute kidney injury on chronic kidney disease. Currently plan is monitor for improvement in renal function.  Subjective: No nausea no vomiting.  No fever no chills from diarrhea.  Oral intake improving.  No chest pain.  Assessment and Plan: 1.  Acute kidney injury on chronic kidney disease stage IV. Metabolic acidosis Hypomagnesemia Hypokalemia Hypophosphatemia. Electrolytes currently being replaced. Based on setting.  Around 2.4. On presentation serum creatinine 6.0. Nephrology was consulted. Treated with IV fluids. Started on bicarb infusion. CT scan negative for any significant hydronephrosis or obstruction. Renal function improving. Monitor.  2.  Orthostatic hypotension Recurrent fall. PT OT recommends home with home health.  Generalized hypertension negative. Monitor.  3.  Essential hypertension Blood pressure mildly elevated. Will resume hydralazine at lower dose.  4.  VTE Continue Eliquis.  5.  Anxiety Continue home regimen.  6.  Lacunar infarct on CT scan Appears to be incidental. On Eliquis.  We will continue. Continue statin.  Body mass index is 22.81 kg/m.        DVT Prophylaxis:   Place TED hose Start: 12/09/20 1519 apixaban (ELIQUIS) tablet 2.5 mg Start: 12/02/20 1000 apixaban (ELIQUIS) tablet 2.5 mg    Pt is Full code.  Communication: no family was present at bedside, at the time of interview.   Data Reviewed: I have personally reviewed and interpreted daily labs, tele strips, imaging. Sodium stable.  Potassium improving.  Hypophosphatemia, hypomagnesemia.  Physical Exam:  General: Appear in mild  distress, no Rash; Oral Mucosa Clear, moist. no Abnormal Neck Mass Or lumps, Conjunctiva normal  Cardiovascular: S1 and S2 Present, no Murmur, Respiratory: good respiratory effort, Bilateral Air entry present and CTA, no Crackles, no wheezes Abdomen: Bowel Sound present, Soft and no tenderness Extremities: no Pedal edema Neurology: alert and oriented to time, place, and person affect appropriate. no new focal deficit Gait not checked due to patient safety concerns  Vitals:   12/08/20 1642 12/08/20 2303 12/09/20 0505 12/09/20 1617  BP: 133/60 (!) 145/71 (!) 166/74 (!) 137/59  Pulse: 85 75 78 67  Resp: 19 17 16 18   Temp: 99.5 F (37.5 C) 98.7 F (37.1 C) 98.9 F (37.2 C) 98.6 F (37 C)  TempSrc: Oral Oral Oral Oral  SpO2: 97% 97% 100% 94%  Weight:   60.3 kg   Height:        Disposition:  Status is: Inpatient  Remains inpatient appropriate because:Unsafe d/c plan  Dispo: The patient is from: Home              Anticipated d/c is to: Home              Patient currently is not medically stable to d/c.   Difficult to place patient No        Time spent: 35 minutes. I reviewed all nursing notes, pharmacy notes, vitals, pertinent old records. I have discussed plan of care as described above with RN.  Author: Berle Mull, MD [facility] 12/09/2020 7:28 PM  To reach On-call, see care teams to locate the attending and reach out via [email or web address]. Between 7PM-7AM, please contact night-coverage If you still have  difficulty reaching the attending provider, please page the [facility] (Director on Call) for [facility] on amion for assistance.

## 2020-12-09 NOTE — Care Management (Addendum)
NCM called daughter [name] [phone] back.   [name] wanted NCM to arrange [facility]. NCM explained family is required to arrange private duty. TOC supervisor also discussed this with her. NCM explained will ask MD for orders to maximize home health RN,PT,OT,SW and aide. [facility] will do an admission visit and assessment and discuss frequency of visits. [facility] will not be in home daily. [name] voiced understanding.   [name] with [facility] aware orders entered.  PT recommending walker. 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 NCM left [name] with [facility] a message for rolling walker.

## 2020-12-09 NOTE — Progress Notes (Signed)
Physical Therapy Treatment Patient Details Name: [name] MRN: [id number] DOB: 1945/11/20 Today's Date: 12/09/2020   History of Present Illness Pt is 75 yo female presenting [facility] ED on 9/15 after a fall. Also found to have ARF. Imaging showed Left basal ganglia  lacunar infarct.  PMH: recent COVID, CAD, CHF, R breast CA s/p lumpectomy, anemia, DM, HTN.    PT Comments    Pt tolerates treatment well, ambulating for community distances without loss of balance. Pt demonstrates steady gait with and without use of an assistive device. Pt reports ambulating independently in the hallway multiple times today. Pt will benefit from continued acute therapy services to provide high level gait and balance challenges. PT continues t recommend discharge home with HHPT.   Recommendations for follow up therapy are one component of a multi-disciplinary discharge planning process, led by the attending physician.  Recommendations may be updated based on patient status, additional functional criteria and insurance authorization.  Follow Up Recommendations  Home health PT     Equipment Recommendations  Rolling walker with 5" wheels (pt reports owning a RW, will need to be confirmed)    Recommendations for Other Services       Precautions / Restrictions Precautions Precautions: Fall;Other (comment) Precaution Comments: monitor orthostatic BP. Granddaughter previously reports 3 falls since previous d/c. Pt only reports history of one fall during today's session Restrictions Weight Bearing Restrictions: No     Mobility  Bed Mobility               General bed mobility comments: found standing beside shower adjusting water about to get in the shower, left beside bed applying vasoline and getting dressed    Transfers Overall transfer level: Independent Equipment used: None Transfers: Sit to/from Stand Sit to Stand: Independent Stand pivot transfers: Supervision       General  transfer comment: pt ambulating in room with walker upon PT arrival  Ambulation/Gait Ambulation/Gait assistance: Modified independent (Device/Increase time);Supervision Gait Distance (Feet):  (600) Assistive device: Rolling walker (2 wheeled);None Gait Pattern/deviations: Step-through pattern Gait velocity: functional Gait velocity interpretation: >2.62 ft/sec, indicative of community ambulatory General Gait Details: steady step-through gait, pt ambulates for first 300' with RW, 2nd 300' without device   Stairs             Wheelchair Mobility    Modified Rankin (Stroke Patients Only) Modified Rankin (Stroke Patients Only) Pre-Morbid Rankin Score: Slight disability Modified Rankin: Moderate disability     Balance Overall balance assessment: Mild deficits observed, not formally tested                                          Cognition Arousal/Alertness: Awake/alert Behavior During Therapy: WFL for tasks assessed/performed Overall Cognitive Status: Within Functional Limits for tasks assessed                                        Exercises      General Comments General comments (skin integrity, edema, etc.): pt denies all symptoms of orthostatic BP. PT reinforces orthostatic precautions      Pertinent Vitals/Pain Pain Assessment: No/denies pain    Home Living                      Prior Function  PT Goals (current goals can now be found in the care plan section) Acute Rehab PT Goals Patient Stated Goal: pt goal to go home Progress towards PT goals: Progressing toward goals    Frequency    Min 3X/week      PT Plan Current plan remains appropriate    Co-evaluation              AM-PAC PT "6 Clicks" Mobility   Outcome Measure  Help needed turning from your back to your side while in a flat bed without using bedrails?: None Help needed moving from lying on your back to sitting on the side of  a flat bed without using bedrails?: None Help needed moving to and from a bed to a chair (including a wheelchair)?: None Help needed standing up from a chair using your arms (e.g., wheelchair or bedside chair)?: None Help needed to walk in hospital room?: A Little Help needed climbing 3-5 steps with a railing? : A Little 6 Click Score: 22    End of Session   Activity Tolerance: Patient tolerated treatment well Patient left: in chair;with call bell/phone within reach Nurse Communication: Mobility status PT Visit Diagnosis: Other abnormalities of gait and mobility (R26.89);Muscle weakness (generalized) (M62.81);Repeated falls (R29.6)     Time: 0981-1914 PT Time Calculation (min) (ACUTE ONLY): 9 min  Charges:  $Therapeutic Activity: 8-22 mins                     Zenaida Niece, PT, DPT Acute Rehabilitation Pager: [phone]    Zenaida Niece 12/09/2020, 2:20 PM

## 2020-12-09 NOTE — Progress Notes (Addendum)
Occupational Therapy Treatment Patient Details Name: [name] MRN: [id number] DOB: 08/07/1945 Today's Date: 12/09/2020   History of present illness [name] is 75 yo female presenting to [facility] ED on 9/15 after a fall. Also found to have ARF. Imaging showed Left basal ganglia  lacunar infarct.  PMH: recent COVID, CAD, CHF, R breast CA s/p lumpectomy, anemia, DM, HTN.   OT comments  [name]. Seen for skilled OT treatment session.  Found in b.room preparing for a shower.  Educated on safe items to hold onto during noted furniture walking from b.room to eob.  [name]. Highly motivated and deferred any assistance.  Eager for d/c home when ready.     Recommendations for follow up therapy are one component of a multi-disciplinary discharge planning process, led by the attending physician.  Recommendations may be updated based on patient status, additional functional criteria and insurance authorization.    Follow Up Recommendations       Equipment Recommendations       Recommendations for Other Services      Precautions / Restrictions Precautions Precautions: Fall;Other (comment) Precaution Comments: monitor orthostatic BP. Granddaughter reports 3 falls since previous d/c       Mobility Bed Mobility               General bed mobility comments: found standing beside shower adjusting water about to get in the shower, left beside bed applying vasoline and getting dressed    Transfers Overall transfer level: Needs assistance Equipment used: None Transfers: Sit to/from [facility] Sit to Stand: Supervision Stand pivot transfers: Supervision       General transfer comment: intermittent furniture walking    Balance                                           ADL either performed or assessed with clinical judgement   ADL Overall ADL's : Needs assistance/impaired     Grooming: Wash/dry hands;Wash/dry face;Oral care;Modified independent;Standing    Upper Body Bathing: Supervision/ safety;Sitting   Lower Body Bathing: Supervison/ safety;Sitting/lateral leans   Upper Body Dressing : Modified independent;Sitting;Standing   Lower Body Dressing: Modified independent;Sit to/from stand;Sitting/lateral leans   Toilet Transfer: Modified Independent;Regular Toilet   Toileting- Clothing Manipulation and Hygiene: Modified independent;Sitting/lateral lean   Tub/ Shower Transfer: Walk-in shower;Min guard;Grab bars   Functional mobility during ADLs: Supervision/safety General ADL Comments: [name]. in room ambulating without assistnce getting her shower started.  cues for safe items to hold onto during intermittent furniture walking. no dme in use. rn staff aware of this and [name]. also reports she has been ambulating without assistance and managing all of her self care needs     Vision       Perception     Praxis      Cognition Arousal/Alertness: Awake/alert                                              Exercises     Shoulder Instructions       General Comments      Pertinent Vitals/ Pain       Pain Assessment: No/denies pain  Home Living  Prior Functioning/Environment              Frequency  Min 2X/week        Progress Toward Goals  OT Goals(current goals can now be found in the care plan section)  Progress towards OT goals: Progressing toward goals     Plan Frequency remains appropriate;Discharge plan needs to be updated    Co-evaluation                 AM-PAC OT "6 Clicks" Daily Activity     Outcome Measure   Help from another person eating meals?: None Help from another person taking care of personal grooming?: A Little Help from another person toileting, which includes using toliet, bedpan, or urinal?: A Little Help from another person bathing (including washing, rinsing, drying)?: A Little Help from another person to  put on and taking off regular upper body clothing?: A Little Help from another person to put on and taking off regular lower body clothing?: A Little 6 Click Score: 19    End of Session    OT Visit Diagnosis: Unsteadiness on feet (R26.81);Cognitive communication deficit (R41.841) Symptoms and signs involving cognitive functions: Cerebral infarction   Activity Tolerance Patient tolerated treatment well   Patient Left Other (comment) (standing beside bed getting dressed and applying vasoline)   Nurse Communication Other (comment) (alerted rn per [name]. request that she has had 3 large loose bowel movements and thinks that is too many for one morning)        Time: 9150-5697 OT Time Calculation (min): 19 min  Charges: OT General Charges $OT Visit: 1 Visit OT Treatments $Self Care/Home Management : 8-22 mins  Sonia Baller, COTA/L Acute Rehabilitation [phone]   Tanya Nones 12/09/2020, 1:28 PM

## 2020-12-10 DIAGNOSIS — N184 Chronic kidney disease, stage 4 (severe): Secondary | ICD-10-CM | POA: Diagnosis not present

## 2020-12-10 DIAGNOSIS — N179 Acute kidney failure, unspecified: Secondary | ICD-10-CM | POA: Diagnosis not present

## 2020-12-10 LAB — BASIC METABOLIC PANEL
Anion gap: 8 (ref 5–15)
BUN: 33 mg/dL — ABNORMAL HIGH (ref 8–23)
CO2: 23 mmol/L (ref 22–32)
Calcium: 8.5 mg/dL — ABNORMAL LOW (ref 8.9–10.3)
Chloride: 108 mmol/L (ref 98–111)
Creatinine, Ser: 2.15 mg/dL — ABNORMAL HIGH (ref 0.44–1.00)
GFR, Estimated: 24 mL/min — ABNORMAL LOW (ref >60)
Glucose, Bld: 83 mg/dL (ref 70–99)
Potassium: 3.5 mmol/L (ref 3.5–5.1)
Sodium: 139 mmol/L (ref 135–145)

## 2020-12-10 LAB — GLUCOSE, CAPILLARY
Glucose-Capillary: 81 mg/dL (ref 70–99)
Glucose-Capillary: 115 mg/dL — ABNORMAL HIGH (ref 70–99)

## 2020-12-10 LAB — PHOSPHORUS: Phosphorus: 2.5 mg/dL (ref 2.5–4.6)

## 2020-12-10 LAB — MAGNESIUM: Magnesium: 1.5 mg/dL — ABNORMAL LOW (ref 1.7–2.4)

## 2020-12-10 MED ORDER — SODIUM BICARBONATE 650 MG PO TABS
1300.0000 mg | ORAL_TABLET | Freq: Two times a day (BID) | ORAL | 0 refills | Status: DC
Start: 2020-12-10 — End: 2021-10-20

## 2020-12-10 MED ORDER — NEPRO/CARBSTEADY PO LIQD
237.0000 mL | Freq: Two times a day (BID) | ORAL | 0 refills | Status: DC
Start: 2020-12-10 — End: 2021-10-20

## 2020-12-10 MED ORDER — MAGNESIUM SULFATE 2 GM/50ML IV SOLN
2.0000 g | Freq: Once | INTRAVENOUS | Status: AC
  Administered 2020-12-10: 2 g via INTRAVENOUS
  Filled 2020-12-10: qty 50

## 2020-12-10 MED ORDER — FAMOTIDINE 10 MG PO TABS: 10.0000 mg | ORAL_TABLET | Freq: Every day | ORAL | 0 refills | Status: DC

## 2020-12-10 MED ORDER — FUROSEMIDE 20 MG PO TABS: 20.0000 mg | ORAL_TABLET | Freq: Every day | ORAL | 0 refills | Status: DC | PRN

## 2020-12-10 NOTE — TOC Transition Note (Signed)
Transition of Care [facility]) - CM/SW Discharge Note   Patient Details  Name: [name] MRN: [id number] Date of Birth: 1945-05-24  [facility] CM/SW Contact:  Pollie Friar, RN Phone Number: 12/10/2020, 11:05 AM   Clinical Narrative:    Patient is discharging home with [facility] through [facility]. Stacey with [facility] is aware of d/c home.  Walker for home is at the bedside.  Pt has transport home.    Final next level of care: [place] Barriers to Discharge: No Barriers Identified   Patient Goals and CMS Choice        Discharge Placement                       Discharge Plan and Services                DME Arranged: Walker rolling DME Agency: [employer]     Representative spoke with at DME Agency: Dme in the room HH Arranged: RN, PT, OT, Nurse's Aide, Social Work [employer] Agency: [place], [place]     Representative spoke with at [place]: [place]) Interventions     Readmission Risk Interventions Readmission Risk Prevention Plan 09/04/2020  Transportation Screening Complete  Medication Review Press photographer) Complete  HRI or [place] Complete  SW Recovery Care/Counseling Consult Complete  [place] Not Applicable  Some recent data might be hidden

## 2020-12-11 NOTE — Discharge Summary (Signed)
[facility] Discharge Summary   Patient: [name] ZOX:[id number]  PCP: Seward Carol, MD  Date of admission: 12/01/2020   Date of discharge: 12/10/2020      Discharge Diagnoses:  Principal Problem:   Acute renal failure superimposed on stage 4 chronic kidney disease ([place]) Active Problems:   GERD without esophagitis   Coronary artery disease involving native coronary artery of native heart   Anemia in chronic kidney disease   Type 2 diabetes mellitus with stage 4 chronic kidney disease, without long-term current use of insulin ([place])   Essential hypertension   Generalized weakness   (HFpEF) heart failure with preserved ejection fraction ([place])   Acute renal failure (ARF) ([place])  Admitted From: home Disposition:  Home home health  Recommendations for Outpatient Follow-up:  PCP: Follow-up with PCP in 1 week with a CBC and BMP. Follow-up with nephrology as well. Follow up LABS/TEST: CBC and BMP   Follow-up Information     [facility] [place] Follow up.   Specialty: Home Health Services Contact information: [address] [place] [address] [phone]         Seward Carol, MD. Schedule an appointment as soon as possible for a visit in 1 week(s).   Specialty: Internal Medicine Why: with CBC and BMP Contact information: [address] [place] [address] [phone]         Madelon Lips, MD. Schedule an appointment as soon as possible for a visit in 2 week(s).   Specialty: Nephrology Contact information: [place] [address] [phone]                Discharge Instructions     Ambulatory referral to Hematology / Oncology   Complete by: As directed    Diet renal [id number]   Complete by: As directed    Increase activity slowly   Complete by: As directed        Diet recommendation: Renal diet  Activity: The patient is advised to gradually reintroduce usual activities, as  tolerated  Discharge Condition: stable  Code Status: Full code   History of present illness: As per the H and P dictated on admission, "[name] is a 75 y.o. female with medical history significant for CAD s/p DES, HFpEF (EF 55-60% by TTE 07/01/2018), history of VTE on Eliquis, CKD stage IV, right breast cancer s/p lumpectomy, type 2 diabetes, hypertension, hyperlipidemia, anemia of chronic disease and iron/B12 deficiencies, GERD, GAD who presented to the ED for evaluation of worsening renal function and fall at home.  Patient with multiple recent admissions since April of this year.  Most recently admitted from 11/15/2020-12/16/2020 for dyspnea felt related to HFpEF exacerbation.  She is managed with diuresis.  VQ scan was negative for evidence of PE.  She was started on chronic anticoagulation with Eliquis due to previous VTE.  She was also noted to have orthostatic hypotension while in hospital.  Patient returns after having a follow-up visit with her primary care physician earlier today (12/01/2020).  She had lab work performed and was noted to have significantly worsened renal function and was advised to come to the ED for further evaluation.  She was also noted to have a low blood pressure.  Patient states that over the last several days she has been having frequent spells of lightheadedness/dizziness when she is up and active.  She did have 2 falls yesterday that did not result in significant injury.  She says these  occurred when she was up with her walker, lost control as a walker rolled away, and fell backwards.  She did not lose consciousness.  She also reports poor appetite and has not been eating well recently.  She denies any nausea, vomiting, abdominal pain, dysuria.  She reports chronic loose stools which is unchanged from baseline.  She denies any obvious bleeding.  She has been feeling generally weak.   ED Course:  Initial vitals showed BP 120/66, pulse 85, RR 16, temp 98.3  F, SPO2 100% on room air.   Labs significant for BUN 66, creatinine 6.19 (previously BUN 26, creatinine 2.82 on 11/19/2020), sodium 137, potassium 5.1, bicarb 18, serum glucose 116, AST 14, LT 14, alk phos 66, total bilirubin 0.6, WBC 7.1, hemoglobin 11.4, platelets 277,000.   CT head without contrast is negative for evidence of acute intracranial abnormality.  Prior left basal ganglia lacunar infarct noted.  Small vessel ischemic changes reported.   Patient was started on maintenance IV fluids with NS@ 125 mL/hour.  The hospitalist service was consulted to admit for further evaluation and management."  Hospital Course:  Summary of her active problems in the hospital is as following.   1.  Acute kidney injury on chronic kidney disease stage IV. Metabolic acidosis Hypomagnesemia Hypokalemia Hypophosphatemia. Electrolytes replaced.  Outpatient nutrition supplements recommended which will hopefully continue to replenish electrolytes. Serum creatinine around baseline. On presentation serum creatinine 6.0. Nephrology was consulted. Treated with IV fluids. Started on bicarb infusion.  Continue p.o. bicarb on discharge. CT scan negative for any significant hydronephrosis or obstruction. Renal function improving. Monitor.   2.  Orthostatic hypotension Recurrent fall. PT OT recommends home with home health.  Generalized hypertension negative. Monitor.   3.  Essential hypertension Blood pressure mildly elevated. Will resume hydralazine at lower dose.   4.  VTE Continue Eliquis.   5.  Anxiety Continue home regimen.   6.  Lacunar infarct on CT scan Appears to be incidental. On Eliquis.  We will continue. Continue statin.  Body mass index is 22.82 kg/m.   Patient was seen by physical therapy, who recommended Home Health,. On the day of the discharge the patient's vitals were stable, and no other new acute medical condition were reported. The patient was felt safe to be discharge  at Home with Therapy.  Consultants: Nephrology  Procedures: none  DISCHARGE MEDICATION: Allergies as of 12/10/2020       Reactions   Percocet [oxycodone-acetaminophen] Other (See Comments)   headaches   Diazepam Other (See Comments)   REACTION: Agitation Other reaction(s): agitation   Haloperidol Lactate Nausea And Vomiting   Lorazepam Nausea Only   Other reaction(s): nausea   Morphine Sulfate Other (See Comments)   REACTION: Agitation   Propoxyphene Hcl Nausea And Vomiting   Tramadol Hcl Nausea And Vomiting   Anesthetics, Halogenated    Per patient's daughter, she can not tolerate anesthetics.    Etomidate    Heart stopped during anesthesia   Fentanyl    Heart stopped during anesthesia   Haloperidol    Other reaction(s): nausea & vomitin   Morphine Sulfate    Other reaction(s): nausea   Oxycodone-acetaminophen    Other reaction(s): headaches   Propoxyphene    Other reaction(s): nausea & vomiting   Tramadol Hcl    Other reaction(s): nausea & vomitin Other reaction(s): nausea & vomitin   Versed [midazolam]    Heart stopped during anesthesia        Medication List  STOP taking these medications    linagliptin 5 MG Tabs tablet Commonly known as: TRADJENTA   nebivolol 10 MG tablet Commonly known as: BYSTOLIC   potassium chloride SA 20 MEQ tablet Commonly known as: KLOR-CON   torsemide 20 MG tablet Commonly known as: DEMADEX       TAKE these medications    acetaminophen 500 MG tablet Commonly known as: TYLENOL Take 1,000 mg by mouth every 6 (six) hours as needed for moderate pain or headache.   apixaban 2.5 MG Tabs tablet Commonly known as: Eliquis Take 1 tablet (2.5 mg total) by mouth 2 (two) times daily. What changed: Another medication with the same name was removed. Continue taking this medication, and follow the directions you see here.   B-12 COMPLIANCE INJECTION IJ Inject 1 vial as directed every 30 (thirty) days.   budesonide 3 MG 24  hr capsule Commonly known as: ENTOCORT EC Take 1 capsule by mouth once daily   DRY EYES OP Place 1 drop into the right eye 2 (two) times daily as needed (dry eye).   ergocalciferol 1.25 MG (50000 UT) capsule Commonly known as: VITAMIN D2 Take 1 capsule (50,000 Units total) by mouth once a week.   famotidine 10 MG tablet Commonly known as: PEPCID Take 1 tablet (10 mg total) by mouth at bedtime. What changed:  medication strength how much to take   feeding supplement (NEPRO CARB STEADY) Liqd Take 237 mLs by mouth 2 (two) times daily between meals.   FLUoxetine 20 MG capsule Commonly known as: PROZAC Take 20 mg by mouth at bedtime.   furosemide 20 MG tablet Commonly known as: LASIX Take 1 tablet (20 mg total) by mouth daily as needed (weight gain of >3Lbs in 1 day or >5 Lbs in 2 days.). What changed:  medication strength how much to take when to take this reasons to take this   hydrALAZINE 100 MG tablet Commonly known as: APRESOLINE Take 0.5 tablets (50 mg total) by mouth 2 (two) times daily.   lipase/protease/amylase 36000 UNITS Cpep capsule Commonly known as: CREON Take 36,000 Units by mouth 3 (three) times daily with meals.   loperamide 2 MG tablet Commonly known as: IMODIUM A-D Take 4 mg by mouth 4 (four) times daily as needed for diarrhea or loose stools.   melatonin 5 MG Tabs Take 1 tablet (5 mg total) by mouth at bedtime as needed (sleep).   mirtazapine 7.5 MG tablet Commonly known as: REMERON Take 1 tablet (7.5 mg total) by mouth at bedtime.   nitroGLYCERIN 0.4 MG SL tablet Commonly known as: NITROSTAT DISSOLVE 1 TABLET UNDER THE TONGUE EVERY 5 MINUTES AS  NEEDED FOR CHEST PAIN. MAX  OF 3 TABLETS IN 15 MINUTES. CALL 911 IF PAIN PERSISTS. What changed: See the new instructions.   ondansetron 4 MG disintegrating tablet Commonly known as: Zofran ODT Take 1 tablet (4 mg total) by mouth every 8 (eight) hours as needed for nausea or vomiting.    pantoprazole 40 MG tablet Commonly known as: PROTONIX TAKE 1 TABLET BY MOUTH  DAILY BEFORE BREAKFAST What changed: when to take this   RETACRIT IJ Inject 1 vial as directed every 30 (thirty) days.   rosuvastatin 40 MG tablet Commonly known as: CRESTOR Take 40 mg by mouth daily.   sodium bicarbonate 650 MG tablet Take 2 tablets (1,300 mg total) by mouth 2 (two) times daily.   sucralfate 1 g tablet Commonly known as: CARAFATE Take 1 g by mouth 4 (four)  times daily -  with meals and at bedtime.        Discharge Exam: Filed Weights   12/08/20 0456 12/09/20 0505 12/10/20 0601  Weight: 60.3 kg 60.3 kg 60.3 kg   Vitals:   12/10/20 0601 12/10/20 1227  BP: (!) 168/82 115/64  Pulse: 78 85  Resp: 16 16  Temp: 98.4 F (36.9 C) 98.4 F (36.9 C)  SpO2: 98% 100%   General: Appear in mild distress, no Rash; Oral Mucosa Clear, moist. no Abnormal Neck Mass Or lumps, Conjunctiva normal  Cardiovascular: S1 and S2 Present, no Murmur, Respiratory: good respiratory effort, Bilateral Air entry present and CTA, no Crackles, no wheezes Abdomen: Bowel Sound present, Soft and no tenderness Extremities: no Pedal edema Neurology: alert and oriented to time, place, and person affect appropriate. no new focal deficit Gait not checked due to patient safety concerns  The results of significant diagnostics from this hospitalization (including imaging, microbiology, ancillary and laboratory) are listed below for reference.    Significant Diagnostic Studies: DG Chest 2 View  Result Date: 11/15/2020 CLINICAL DATA:  weakness EXAM: CHEST - 2 VIEW COMPARISON:  None. FINDINGS: Vascular stent overlying the heart. The cardiomediastinal silhouette is within normal limits. No pleural effusion. No pneumothorax. No mass or consolidation. No acute osseous abnormality. Right axillary surgical clips. IMPRESSION: No acute abnormality in the chest. Electronically Signed   By: Albin Felling M.D.   On: 11/15/2020  14:38   DG Abd 1 View  Result Date: 12/01/2020 CLINICAL DATA:  Loose stools, constipation intermittent diarrhea as well in a 75 year old female. EXAM: ABDOMEN - 1 VIEW COMPARISON:  June 08, 2020 FINDINGS: Incidentally imaged lung bases are unremarkable. Changes of cholecystectomy. Bowel gas pattern without signs of obstruction. No free air beneath either RIGHT or LEFT hemidiaphragm. No substantial volume of stool visualized in the colon. No sign of organomegaly. Degenerative changes of the spine greatest in the lower lumbar spine. No gross acute abnormality of regional skeletal structures. IMPRESSION: Nonobstructive bowel gas pattern. No substantial volume of stool visualized in the colon. Considerable stool volume seen on prior exam is no longer evident. Electronically Signed   By: Zetta Bills M.D.   On: 12/01/2020 09:42   CT HEAD WO CONTRAST (5MM)  Result Date: 12/01/2020 CLINICAL DATA:  Fall, head trauma, right eye pain, on Eliquis EXAM: CT HEAD WITHOUT CONTRAST TECHNIQUE: Contiguous axial images were obtained from the base of the skull through the vertex without intravenous contrast. COMPARISON:  08/09/2017 FINDINGS: Brain: No evidence of acute infarction, hemorrhage, hydrocephalus, extra-axial collection or mass lesion/mass effect. Subcortical white matter and periventricular small vessel ischemic changes. Left basal ganglia lacunar infarct. Septum cavum pellucidum et vergae. Vascular: Intracranial atherosclerosis. Skull: Normal. Negative for fracture or focal lesion. Sinuses/Orbits: Partial opacification of the left maxillary sinus. Visualized paranasal sinuses and mastoid air cells are otherwise clear. Other: None. IMPRESSION: No evidence of acute intracranial abnormality. Left basal ganglia lacunar infarct. Small vessel ischemic changes. Electronically Signed   By: Julian Hy M.D.   On: 12/01/2020 19:08   MR BRAIN WO CONTRAST  Result Date: 12/02/2020 CLINICAL DATA:  Stroke, follow-up  EXAM: MRI HEAD WITHOUT CONTRAST TECHNIQUE: Multiplanar, multiecho pulse sequences of the brain and surrounding structures were obtained without intravenous contrast. COMPARISON:  None. FINDINGS: Brain: There is no acute infarction or intracranial hemorrhage. There is no intracranial mass, mass effect, or edema. There is no hydrocephalus or extra-axial fluid collection. Cavum septum pellucidum et vergae is noted incidentally. Prominence of the  ventricles and sulci reflects parenchymal volume loss. Chronic small vessel infarcts of the left basal ganglia and adjacent white matter as well as bilateral corona radiata. Additional patchy T2 hyperintensity in the supratentorial and pontine white matter is nonspecific but may reflect mild chronic microvascular ischemic changes. Punctate focus of susceptibility in the right occipital lobe likely reflects chronic microhemorrhage. Vascular: Major vessel flow voids at the skull base are preserved. Skull and upper cervical spine: Normal marrow signal is preserved. Sinuses/Orbits: Circumferential left maxillary sinus mucosal thickening. Bilateral lens replacements. Other: Sella is unremarkable.  Mastoid air cells are clear. IMPRESSION: No evidence of recent infarction, hemorrhage, or mass. Mild chronic microvascular ischemic changes. Few small chronic small vessel infarcts. Electronically Signed   By: Macy Mis M.D.   On: 12/02/2020 18:14   NM Pulmonary Perfusion  Result Date: 11/16/2020 CLINICAL DATA:  Nonspecific chest pain EXAM: NUCLEAR MEDICINE PERFUSION LUNG SCAN TECHNIQUE: Perfusion images were obtained in multiple projections after intravenous injection of radiopharmaceutical. Ventilation scans intentionally deferred if perfusion scan and chest x-ray adequate for interpretation during COVID 19 epidemic. RADIOPHARMACEUTICALS:  4.3 mCi Tc-79mMAA IV COMPARISON:  06/29/2020 FINDINGS: Slight irregularity of perfusion in the peripheral aspect of the RIGHT lung  unchanged, in a nonsegmental pattern. No segmental or subsegmental perfusion defects identified. IMPRESSION: No evidence of pulmonary embolism. Stable exam versus 06/29/2020 Electronically Signed   By: MLavonia DanaM.D.   On: 11/16/2020 11:33   [place]NAL  Result Date: 12/02/2020 CLINICAL DATA:  Chronic kidney disease EXAM: RENAL / URINARY TRACT ULTRASOUND COMPLETE COMPARISON:  09/24/2020 FINDINGS: Right Kidney: Renal measurements: 9.6 x 5.6 x 4.1 cm = volume: 48 mL. Echogenic renal parenchyma. 1.8 x 1.7 x 1.6 cm upper pole cyst, simple. No hydronephrosis. Left Kidney: Renal measurements: 10.2 x 5.2 x 3.9 cm = volume: 110 mL. Echogenic renal parenchyma. 1.6 x 1.4 x 1.7 cm lower pole cyst, simple. Mild hydronephrosis. Bladder: Appears normal for degree of bladder distention. Other: None. IMPRESSION: Echogenic renal parenchyma, suggesting medical renal disease. Bilateral simple renal cysts, as above. Mild left hydronephrosis. Electronically Signed   By: SJulian HyM.D.   On: 12/02/2020 03:08   CT RENAL STONE STUDY  Result Date: 12/02/2020 CLINICAL DATA:  Left hydronephrosis seen on renal ultrasound. EXAM: CT ABDOMEN AND PELVIS WITHOUT CONTRAST TECHNIQUE: Multidetector CT imaging of the abdomen and pelvis was performed following the standard protocol without IV contrast. COMPARISON:  Renal ultrasound from same day. CT abdomen pelvis dated September 03, 2020. FINDINGS: Lower chest: No acute abnormality. Hepatobiliary: No focal liver abnormality is seen. Status post cholecystectomy. No biliary dilatation. Pancreas: Unremarkable. No pancreatic ductal dilatation or surrounding inflammatory changes. Spleen: Normal in size without focal abnormality. Adrenals/Urinary Tract: The adrenal glands are unremarkable. Small bilateral renal cysts again noted. No renal calculi or hydronephrosis. The bladder is unremarkable. Stomach/Bowel: Unchanged small hiatal hernia. The stomach is otherwise within normal limits. Prior right  ileocectomy with anastomosis in the right lower quadrant. No bowel wall thickening, distention, or surrounding inflammatory changes. Vascular/Lymphatic: Aortic atherosclerosis. No enlarged abdominal or pelvic lymph nodes. Reproductive: Status post hysterectomy. No adnexal masses. Other: No free fluid or pneumoperitoneum. Musculoskeletal: No acute or significant osseous findings. IMPRESSION: 1. No acute intra-abdominal process. No hydronephrosis. 2. Aortic Atherosclerosis (ICD10-I70.0). Electronically Signed   By: WTitus DubinM.D.   On: 12/02/2020 16:20   VAS [place] EXTREMITY VENOUS (DVT)  Result Date: 11/16/2020  Lower Venous DVT Study Patient Name:  [name]  Date of  Exam:   11/16/2020 Medical Rec #: [id number]           Accession #:    [id number] Date of Birth: [birth year]           Patient Gender: F Patient Age:   61 years Exam Location:  [facility] Procedure:      VAS [place] LOWER EXTREMITY VENOUS (DVT) Referring Phys: Inda Merlin --------------------------------------------------------------------------------  Indications: Edema.  Comparison Study: 06/29/20 prior Performing Technologist: Archie Patten RVS  Examination Guidelines: A complete evaluation includes B-mode imaging, spectral Doppler, color Doppler, and power Doppler as needed of all accessible portions of each vessel. Bilateral testing is considered an integral part of a complete examination. Limited examinations for reoccurring indications may be performed as noted. The reflux portion of the exam is performed with the patient in reverse Trendelenburg.  +---------+---------------+---------+-----------+----------+-------------------+ RIGHT    CompressibilityPhasicitySpontaneityPropertiesThrombus Aging      +---------+---------------+---------+-----------+----------+-------------------+ CFV      Full           Yes      Yes                                       +---------+---------------+---------+-----------+----------+-------------------+ SFJ      Full                                                             +---------+---------------+---------+-----------+----------+-------------------+ FV Prox  Full                                                             +---------+---------------+---------+-----------+----------+-------------------+ FV Mid   Full                                                             +---------+---------------+---------+-----------+----------+-------------------+ FV DistalFull                                                             +---------+---------------+---------+-----------+----------+-------------------+ PFV      Full                                                             +---------+---------------+---------+-----------+----------+-------------------+ POP      Full           Yes      Yes                                      +---------+---------------+---------+-----------+----------+-------------------+  PTV      Full                                                             +---------+---------------+---------+-----------+----------+-------------------+ PERO                                                  Not well visualized +---------+---------------+---------+-----------+----------+-------------------+   +---------+---------------+---------+-----------+----------+-------------------+ LEFT     CompressibilityPhasicitySpontaneityPropertiesThrombus Aging      +---------+---------------+---------+-----------+----------+-------------------+ CFV      Full           Yes      Yes                                      +---------+---------------+---------+-----------+----------+-------------------+ SFJ      Full                                                             +---------+---------------+---------+-----------+----------+-------------------+ FV  Prox  Full                                                             +---------+---------------+---------+-----------+----------+-------------------+ FV Mid   Full                                                             +---------+---------------+---------+-----------+----------+-------------------+ FV DistalFull                                                             +---------+---------------+---------+-----------+----------+-------------------+ PFV      Full                                                             +---------+---------------+---------+-----------+----------+-------------------+ POP      Full           Yes      Yes                                      +---------+---------------+---------+-----------+----------+-------------------+ PTV      Full                                                             +---------+---------------+---------+-----------+----------+-------------------+  PERO                                                  Not well visualized +---------+---------------+---------+-----------+----------+-------------------+     Summary: BILATERAL: - No evidence of deep vein thrombosis seen in the lower extremities, bilaterally. -No evidence of popliteal cyst, bilaterally.   *See table(s) above for measurements and observations. Electronically signed by Harold Barban MD on 11/16/2020 at 6:29:59 PM.    Final     Microbiology: Recent Results (from the past 240 hour(s))  SARS CORONAVIRUS 2 (TAT 6-24 HRS) Nasopharyngeal Nasopharyngeal Swab     Status: None   Collection Time: 12/02/20  4:29 AM   Specimen: Nasopharyngeal Swab  Result Value Ref Range Status   SARS Coronavirus 2 NEGATIVE NEGATIVE Final    Comment: (NOTE) SARS-CoV-2 target nucleic acids are NOT DETECTED.  The SARS-CoV-2 RNA is generally detectable in upper and lower respiratory specimens during the acute phase of infection. Negative results do not preclude  SARS-CoV-2 infection, do not rule out co-infections with other pathogens, and should not be used as the sole basis for treatment or other patient management decisions. Negative results must be combined with clinical observations, patient history, and epidemiological information. The expected result is Negative.  Fact Sheet for Patients: SugarRoll.be  Fact Sheet for Healthcare Providers: https://www.woods-mathews.com/  This test is not yet approved or cleared by the [place] FDA and  has been authorized for detection and/or diagnosis of SARS-CoV-2 by FDA under an Emergency Use Authorization (EUA). This EUA will remain  in effect (meaning this test can be used) for the duration of the COVID-19 declaration under Se ction 564(b)(1) of the Act, 21 U.S.C. section 360bbb-3(b)(1), unless the authorization is terminated or revoked sooner.  Performed at [facility], [place] [address], [place], [place] [address]      Labs: CBC: Recent Labs  Lab 12/07/20 0301  WBC 5.4  NEUTROABS 3.7  HGB 9.7*  HCT 31.4*  MCV 87.7  PLT 103   Basic Metabolic Panel: Recent Labs  Lab 12/06/20 0334 12/09/20 0907 12/10/20 0708  NA 140 140 139  K 3.1* 4.6 3.5  CL 106 108 108  CO2 26 24 23   GLUCOSE 101* 120* 83  BUN 42* 36* 33*  CREATININE 2.12* 2.25* 2.15*  CALCIUM 8.5* 9.1 8.5*  MG  --  1.2* 1.5*  PHOS  --  1.0* 2.5   Liver Function Tests: Recent Labs  Lab 12/09/20 0907  ALBUMIN 2.8*   CBG: Recent Labs  Lab 12/09/20 1053 12/09/20 1600 12/09/20 2141 12/10/20 0602 12/10/20 1101  GLUCAP 119* 135* 152* 81 115*    Time spent: 35 minutes  Signed:  Berle Mull  [facility] 12/10/2020

## 2020-12-13 ENCOUNTER — Other Ambulatory Visit: Payer: Self-pay | Admitting: *Deleted

## 2020-12-13 NOTE — Patient Outreach (Signed)
[facility]) Care Management  12/13/2020  [name] Dec 10, 1945 [id number]   Member readmitted to hospital 9/15-9/24 for renal disease and falls following previous discharge.  Outgoing call placed to daughter [name].  She was discharged back home with her son, however they are still concerned about member having adequate supervision.  Discussed application for PCS as well as Aide and Attendance, forms sent to apply for both.  Daughter will notify this care manager should she need additional help with forms.  Denies any urgent concerns, encouraged to contact this care manager with questions.  Agrees to follow up within the next 2 weeks.   Goals Addressed             This Visit's Progress    THN - Make and Keep All Appointments   On track    Timeframe:  Short-Term Goal Priority:  Medium Start Date:     9/27                        Expected End Date:             12/27 (goal reset)  Barriers: Financial Knowledge     - ask family or friend for a ride - keep a calendar with appointment dates    Why is this important?   Part of staying healthy is seeing the doctor for follow-up care.  If you forget your appointments, there are some things you can do to stay on track.    Notes:   6/27 - Conference calls placed to schedule follow up appointments. GI scheduled for 7/1, PCP for 7/12, and cardiology for 8/3.  She will drive self to these appointments  7/20 - Was readmitted to hospital 7/8-7/12.  Followed up with PCP yesterday, will go to cancer center on 7/22, and have appointment with cardiology on 8/3.  Either her friend or family will provide transportation as she is not currently driving.  8/93 - Follow up appointments reviewed with daughter.  [name] [place] visit tomorrow with [facility] (first since discharge on 9/3), Hematology on 9/16, PCP within the next 2 weeks.    9/27 - Conference call placed to PCP, office visit scheduled for 10/6.  Call also placed to  nephrology, notified that they will speak with provider and call daughter directly to schedule.  Call placed to [facility], they spoke to daughter [name], will make home visit tomorrow.     THN - Manage My Diet   Not on track    Timeframe:  Long-Range Goal Priority:  High Start Date:   6/27                        Expected End Date:           12/27            Barriers: Health Behaviors     - ask for help if I have trouble affording healthy foods - keep healthy snacks on hand - track weight in diary - weigh myself daily    Why is this important?   A healthy diet is important for mental and physical health.  Healthy food helps repair damaged body tissue and maintains strong bones and muscles.  No single food is just right so eating a variety of proteins, fruits, vegetables and grains is best.  You may need to change what you eat or drink to manage kidney disease.  A dietitian is the  best person to guide you.     Notes:   6/27 - Discussed recent weight loss (195 pounds in May, currently 145 pounds).  [name] follow up with GI.  Report appetite is increasing, N/V is decreasing.  Referral to Care Guide for community resources, including food.  7/20 - Report she is now staying with a friend, which [name] helped her concern for food resources as well as ability to cook food during recovery.    9/12 - Member now living with son.  Family helping to manage health care.  Per daughter, appetite [name] increased since being on Mirtazepine.  She continues to be weak, family concerned about her being home alone during the day.  9/27 - Nepro ordered at recent discharge, daughter report family will not be able to afford if member is on for long period of time.  They will continue to encouraged increase po intake     THN - Prevent Falls and Injury       Timeframe:  Long-Range Goal Priority:  High Start Date:     9/27                        Expected End Date:      12/27                 Barriers: Health  Behaviors  Follow Up Date 12/30/2020    - keep my cell phone with me always - use a nonslip pad with throw rugs, or remove them completely - use a cane or walker    Why is this important?   Most falls happen when it is hard for you to walk safely. Your balance may be off because of an illness. You may have pain in your knees, hip or other joints.  You may be overly tired or taking medicines that make you sleepy. You may not be able to see or hear clearly.  Falls can lead to broken bones, bruises or other injuries.  There are things you can do to help prevent falling.     Notes:   9/27 - Per daughter, member is refusing to use walker in the home despite at least 3 falls in the last couple weeks requiring readmission.  She was not qualified for CIR or SNF as she was too high functioning, will have [place] starting tomorrow.       [name], [place], MSN [place] [phone]

## 2020-12-14 DIAGNOSIS — F32A Depression, unspecified: Secondary | ICD-10-CM | POA: Diagnosis not present

## 2020-12-14 DIAGNOSIS — E1151 Type 2 diabetes mellitus with diabetic peripheral angiopathy without gangrene: Secondary | ICD-10-CM | POA: Diagnosis not present

## 2020-12-14 DIAGNOSIS — E440 Moderate protein-calorie malnutrition: Secondary | ICD-10-CM | POA: Diagnosis not present

## 2020-12-14 DIAGNOSIS — E785 Hyperlipidemia, unspecified: Secondary | ICD-10-CM | POA: Diagnosis not present

## 2020-12-14 DIAGNOSIS — I2510 Atherosclerotic heart disease of native coronary artery without angina pectoris: Secondary | ICD-10-CM | POA: Diagnosis not present

## 2020-12-14 DIAGNOSIS — N184 Chronic kidney disease, stage 4 (severe): Secondary | ICD-10-CM | POA: Diagnosis not present

## 2020-12-14 DIAGNOSIS — Z7901 Long term (current) use of anticoagulants: Secondary | ICD-10-CM | POA: Diagnosis not present

## 2020-12-14 DIAGNOSIS — D631 Anemia in chronic kidney disease: Secondary | ICD-10-CM | POA: Diagnosis not present

## 2020-12-14 DIAGNOSIS — N2581 Secondary hyperparathyroidism of renal origin: Secondary | ICD-10-CM | POA: Diagnosis not present

## 2020-12-14 DIAGNOSIS — E538 Deficiency of other specified B group vitamins: Secondary | ICD-10-CM | POA: Diagnosis not present

## 2020-12-14 DIAGNOSIS — E1169 Type 2 diabetes mellitus with other specified complication: Secondary | ICD-10-CM | POA: Diagnosis not present

## 2020-12-14 DIAGNOSIS — I130 Hypertensive heart and chronic kidney disease with heart failure and stage 1 through stage 4 chronic kidney disease, or unspecified chronic kidney disease: Secondary | ICD-10-CM | POA: Diagnosis not present

## 2020-12-14 DIAGNOSIS — M48062 Spinal stenosis, lumbar region with neurogenic claudication: Secondary | ICD-10-CM | POA: Diagnosis not present

## 2020-12-14 DIAGNOSIS — I5032 Chronic diastolic (congestive) heart failure: Secondary | ICD-10-CM | POA: Diagnosis not present

## 2020-12-14 DIAGNOSIS — E1140 Type 2 diabetes mellitus with diabetic neuropathy, unspecified: Secondary | ICD-10-CM | POA: Diagnosis not present

## 2020-12-14 DIAGNOSIS — L930 Discoid lupus erythematosus: Secondary | ICD-10-CM | POA: Diagnosis not present

## 2020-12-14 DIAGNOSIS — G8929 Other chronic pain: Secondary | ICD-10-CM | POA: Diagnosis not present

## 2020-12-14 DIAGNOSIS — R634 Abnormal weight loss: Secondary | ICD-10-CM | POA: Diagnosis not present

## 2020-12-14 DIAGNOSIS — E1122 Type 2 diabetes mellitus with diabetic chronic kidney disease: Secondary | ICD-10-CM | POA: Diagnosis not present

## 2020-12-14 DIAGNOSIS — I872 Venous insufficiency (chronic) (peripheral): Secondary | ICD-10-CM | POA: Diagnosis not present

## 2020-12-14 DIAGNOSIS — I4510 Unspecified right bundle-branch block: Secondary | ICD-10-CM | POA: Diagnosis not present

## 2020-12-14 DIAGNOSIS — M109 Gout, unspecified: Secondary | ICD-10-CM | POA: Diagnosis not present

## 2020-12-15 ENCOUNTER — Telehealth: Payer: Self-pay | Admitting: Hematology

## 2020-12-15 NOTE — Telephone Encounter (Signed)
Established pt of Dr. Ernestina Penna. Scheduled f/u per 9/24 referral. Pt is aware of appt date and time.

## 2020-12-16 ENCOUNTER — Other Ambulatory Visit: Payer: Self-pay | Admitting: Hematology

## 2020-12-16 DIAGNOSIS — E559 Vitamin D deficiency, unspecified: Secondary | ICD-10-CM

## 2020-12-19 DIAGNOSIS — E440 Moderate protein-calorie malnutrition: Secondary | ICD-10-CM | POA: Diagnosis not present

## 2020-12-19 DIAGNOSIS — F32A Depression, unspecified: Secondary | ICD-10-CM | POA: Diagnosis not present

## 2020-12-19 DIAGNOSIS — E1140 Type 2 diabetes mellitus with diabetic neuropathy, unspecified: Secondary | ICD-10-CM | POA: Diagnosis not present

## 2020-12-19 DIAGNOSIS — E785 Hyperlipidemia, unspecified: Secondary | ICD-10-CM | POA: Diagnosis not present

## 2020-12-19 DIAGNOSIS — N184 Chronic kidney disease, stage 4 (severe): Secondary | ICD-10-CM | POA: Diagnosis not present

## 2020-12-19 DIAGNOSIS — N2581 Secondary hyperparathyroidism of renal origin: Secondary | ICD-10-CM | POA: Diagnosis not present

## 2020-12-19 DIAGNOSIS — I5032 Chronic diastolic (congestive) heart failure: Secondary | ICD-10-CM | POA: Diagnosis not present

## 2020-12-19 DIAGNOSIS — E1151 Type 2 diabetes mellitus with diabetic peripheral angiopathy without gangrene: Secondary | ICD-10-CM | POA: Diagnosis not present

## 2020-12-19 DIAGNOSIS — E1122 Type 2 diabetes mellitus with diabetic chronic kidney disease: Secondary | ICD-10-CM | POA: Diagnosis not present

## 2020-12-19 DIAGNOSIS — I2510 Atherosclerotic heart disease of native coronary artery without angina pectoris: Secondary | ICD-10-CM | POA: Diagnosis not present

## 2020-12-19 DIAGNOSIS — I872 Venous insufficiency (chronic) (peripheral): Secondary | ICD-10-CM | POA: Diagnosis not present

## 2020-12-19 DIAGNOSIS — D631 Anemia in chronic kidney disease: Secondary | ICD-10-CM | POA: Diagnosis not present

## 2020-12-19 DIAGNOSIS — R634 Abnormal weight loss: Secondary | ICD-10-CM | POA: Diagnosis not present

## 2020-12-19 DIAGNOSIS — I4510 Unspecified right bundle-branch block: Secondary | ICD-10-CM | POA: Diagnosis not present

## 2020-12-19 DIAGNOSIS — Z7901 Long term (current) use of anticoagulants: Secondary | ICD-10-CM | POA: Diagnosis not present

## 2020-12-19 DIAGNOSIS — E1169 Type 2 diabetes mellitus with other specified complication: Secondary | ICD-10-CM | POA: Diagnosis not present

## 2020-12-19 DIAGNOSIS — E538 Deficiency of other specified B group vitamins: Secondary | ICD-10-CM | POA: Diagnosis not present

## 2020-12-19 DIAGNOSIS — I130 Hypertensive heart and chronic kidney disease with heart failure and stage 1 through stage 4 chronic kidney disease, or unspecified chronic kidney disease: Secondary | ICD-10-CM | POA: Diagnosis not present

## 2020-12-19 DIAGNOSIS — G8929 Other chronic pain: Secondary | ICD-10-CM | POA: Diagnosis not present

## 2020-12-19 DIAGNOSIS — M109 Gout, unspecified: Secondary | ICD-10-CM | POA: Diagnosis not present

## 2020-12-19 DIAGNOSIS — M48062 Spinal stenosis, lumbar region with neurogenic claudication: Secondary | ICD-10-CM | POA: Diagnosis not present

## 2020-12-19 DIAGNOSIS — L930 Discoid lupus erythematosus: Secondary | ICD-10-CM | POA: Diagnosis not present

## 2020-12-21 DIAGNOSIS — E538 Deficiency of other specified B group vitamins: Secondary | ICD-10-CM | POA: Diagnosis not present

## 2020-12-21 DIAGNOSIS — E1151 Type 2 diabetes mellitus with diabetic peripheral angiopathy without gangrene: Secondary | ICD-10-CM | POA: Diagnosis not present

## 2020-12-21 DIAGNOSIS — N2581 Secondary hyperparathyroidism of renal origin: Secondary | ICD-10-CM | POA: Diagnosis not present

## 2020-12-21 DIAGNOSIS — G8929 Other chronic pain: Secondary | ICD-10-CM | POA: Diagnosis not present

## 2020-12-21 DIAGNOSIS — F32A Depression, unspecified: Secondary | ICD-10-CM | POA: Diagnosis not present

## 2020-12-21 DIAGNOSIS — E1122 Type 2 diabetes mellitus with diabetic chronic kidney disease: Secondary | ICD-10-CM | POA: Diagnosis not present

## 2020-12-21 DIAGNOSIS — E1169 Type 2 diabetes mellitus with other specified complication: Secondary | ICD-10-CM | POA: Diagnosis not present

## 2020-12-21 DIAGNOSIS — L930 Discoid lupus erythematosus: Secondary | ICD-10-CM | POA: Diagnosis not present

## 2020-12-21 DIAGNOSIS — E1140 Type 2 diabetes mellitus with diabetic neuropathy, unspecified: Secondary | ICD-10-CM | POA: Diagnosis not present

## 2020-12-21 DIAGNOSIS — I4510 Unspecified right bundle-branch block: Secondary | ICD-10-CM | POA: Diagnosis not present

## 2020-12-21 DIAGNOSIS — I5032 Chronic diastolic (congestive) heart failure: Secondary | ICD-10-CM | POA: Diagnosis not present

## 2020-12-21 DIAGNOSIS — M48062 Spinal stenosis, lumbar region with neurogenic claudication: Secondary | ICD-10-CM | POA: Diagnosis not present

## 2020-12-21 DIAGNOSIS — D631 Anemia in chronic kidney disease: Secondary | ICD-10-CM | POA: Diagnosis not present

## 2020-12-21 DIAGNOSIS — E785 Hyperlipidemia, unspecified: Secondary | ICD-10-CM | POA: Diagnosis not present

## 2020-12-21 DIAGNOSIS — I130 Hypertensive heart and chronic kidney disease with heart failure and stage 1 through stage 4 chronic kidney disease, or unspecified chronic kidney disease: Secondary | ICD-10-CM | POA: Diagnosis not present

## 2020-12-21 DIAGNOSIS — I872 Venous insufficiency (chronic) (peripheral): Secondary | ICD-10-CM | POA: Diagnosis not present

## 2020-12-21 DIAGNOSIS — Z7901 Long term (current) use of anticoagulants: Secondary | ICD-10-CM | POA: Diagnosis not present

## 2020-12-21 DIAGNOSIS — N184 Chronic kidney disease, stage 4 (severe): Secondary | ICD-10-CM | POA: Diagnosis not present

## 2020-12-21 DIAGNOSIS — I2510 Atherosclerotic heart disease of native coronary artery without angina pectoris: Secondary | ICD-10-CM | POA: Diagnosis not present

## 2020-12-21 DIAGNOSIS — R634 Abnormal weight loss: Secondary | ICD-10-CM | POA: Diagnosis not present

## 2020-12-21 DIAGNOSIS — E440 Moderate protein-calorie malnutrition: Secondary | ICD-10-CM | POA: Diagnosis not present

## 2020-12-21 DIAGNOSIS — M109 Gout, unspecified: Secondary | ICD-10-CM | POA: Diagnosis not present

## 2020-12-22 ENCOUNTER — Inpatient Hospital Stay: Payer: Medicare Other | Attending: Hematology | Admitting: Hematology

## 2020-12-22 ENCOUNTER — Other Ambulatory Visit: Payer: Self-pay

## 2020-12-22 ENCOUNTER — Inpatient Hospital Stay: Payer: Medicare Other

## 2020-12-22 VITALS — BP 150/68 | HR 83 | Temp 98.4°F | Resp 18 | Ht 64.0 in | Wt 144.6 lb

## 2020-12-22 DIAGNOSIS — C50311 Malignant neoplasm of lower-inner quadrant of right female breast: Secondary | ICD-10-CM

## 2020-12-22 DIAGNOSIS — E538 Deficiency of other specified B group vitamins: Secondary | ICD-10-CM | POA: Insufficient documentation

## 2020-12-22 DIAGNOSIS — N184 Chronic kidney disease, stage 4 (severe): Secondary | ICD-10-CM | POA: Insufficient documentation

## 2020-12-22 DIAGNOSIS — D464 Refractory anemia, unspecified: Secondary | ICD-10-CM

## 2020-12-22 DIAGNOSIS — D509 Iron deficiency anemia, unspecified: Secondary | ICD-10-CM

## 2020-12-22 DIAGNOSIS — Z170 Estrogen receptor positive status [ER+]: Secondary | ICD-10-CM | POA: Diagnosis not present

## 2020-12-22 DIAGNOSIS — D631 Anemia in chronic kidney disease: Secondary | ICD-10-CM | POA: Diagnosis not present

## 2020-12-22 LAB — CMP (CANCER CENTER ONLY)
ALT: 60 U/L — ABNORMAL HIGH (ref 0–44)
AST: 63 U/L — ABNORMAL HIGH (ref 15–41)
Albumin: 2.9 g/dL — ABNORMAL LOW (ref 3.5–5.0)
Alkaline Phosphatase: 79 U/L (ref 38–126)
Anion gap: 9 (ref 5–15)
BUN: 49 mg/dL — ABNORMAL HIGH (ref 8–23)
CO2: 23 mmol/L (ref 22–32)
Calcium: 9.1 mg/dL (ref 8.9–10.3)
Chloride: 111 mmol/L (ref 98–111)
Creatinine: 2.66 mg/dL — ABNORMAL HIGH (ref 0.44–1.00)
GFR, Estimated: 18 mL/min — ABNORMAL LOW (ref >60)
Glucose, Bld: 127 mg/dL — ABNORMAL HIGH (ref 70–99)
Potassium: 3.6 mmol/L (ref 3.5–5.1)
Sodium: 143 mmol/L (ref 135–145)
Total Bilirubin: 0.4 mg/dL (ref 0.3–1.2)
Total Protein: 6.3 g/dL — ABNORMAL LOW (ref 6.5–8.1)

## 2020-12-22 LAB — CBC WITH DIFFERENTIAL (CANCER CENTER ONLY)
Abs Immature Granulocytes: 0.04 10*3/uL (ref 0.00–0.07)
Basophils Absolute: 0 10*3/uL (ref 0.0–0.1)
Basophils Relative: 0 %
Eosinophils Absolute: 0 10*3/uL (ref 0.0–0.5)
Eosinophils Relative: 1 %
HCT: 32.6 % — ABNORMAL LOW (ref 36.0–46.0)
Hemoglobin: 10 g/dL — ABNORMAL LOW (ref 12.0–15.0)
Immature Granulocytes: 1 %
Lymphocytes Relative: 19 %
Lymphs Abs: 1.2 10*3/uL (ref 0.7–4.0)
MCH: 27.5 pg (ref 26.0–34.0)
MCHC: 30.7 g/dL (ref 30.0–36.0)
MCV: 89.6 fL (ref 80.0–100.0)
Monocytes Absolute: 0.6 10*3/uL (ref 0.1–1.0)
Monocytes Relative: 10 %
Neutro Abs: 4.3 10*3/uL (ref 1.7–7.7)
Neutrophils Relative %: 69 %
Platelet Count: 237 10*3/uL (ref 150–400)
RBC: 3.64 MIL/uL — ABNORMAL LOW (ref 3.87–5.11)
RDW: 19.3 % — ABNORMAL HIGH (ref 11.5–15.5)
WBC Count: 6.2 10*3/uL (ref 4.0–10.5)
nRBC: 0 % (ref 0.0–0.2)

## 2020-12-22 LAB — IRON AND TIBC
Iron: 58 ug/dL (ref 41–142)
Saturation Ratios: 25 % (ref 21–57)
TIBC: 231 ug/dL — ABNORMAL LOW (ref 236–444)
UIBC: 174 ug/dL (ref 120–384)

## 2020-12-22 MED ORDER — CYANOCOBALAMIN 1000 MCG/ML IJ SOLN
1000.0000 ug | Freq: Once | INTRAMUSCULAR | Status: AC
  Administered 2020-12-22: 1000 ug via INTRAMUSCULAR
  Filled 2020-12-22: qty 1

## 2020-12-22 MED ORDER — EPOETIN ALFA-EPBX 40000 UNIT/ML IJ SOLN
40000.0000 [IU] | Freq: Once | INTRAMUSCULAR | Status: AC
  Administered 2020-12-22: 40000 [IU] via SUBCUTANEOUS
  Filled 2020-12-22: qty 1

## 2020-12-22 MED ORDER — MIRTAZAPINE 7.5 MG PO TABS: 7.5000 mg | ORAL_TABLET | Freq: Every day | ORAL | 1 refills | Status: DC

## 2020-12-22 MED ORDER — ONDANSETRON 4 MG PO TBDP: 4.0000 mg | ORAL_TABLET | Freq: Three times a day (TID) | ORAL | 0 refills | Status: DC | PRN

## 2020-12-22 NOTE — Patient Instructions (Signed)
Vitamin B12 Injection What is this medication? Vitamin B12 (VAHY tuh min B12) prevents and treats low vitamin B12 levels in your body. It is used in people who do not get enough vitamin B12 from their diet or when their digestive tract does not absorb enough. Vitamin B12 plays an important role in maintaining the health of your nervous system and red blood cells. This medicine may be used for other purposes; ask your health care provider or pharmacist if you have questions. COMMON BRAND NAME(S): B-12 Compliance Kit, B-12 Injection Kit, Cyomin, Dodex, LA-12, Nutri-Twelve, Physicians EZ Use B-12, Primabalt What should I tell my care team before I take this medication? They need to know if you have any of these conditions: Kidney disease Leber's disease Megaloblastic anemia An unusual or allergic reaction to cyanocobalamin, cobalt, other medications, foods, dyes, or preservatives Pregnant or trying to get pregnant Breast-feeding How should I use this medication? This medication is injected into a muscle or deeply under the skin. It is usually given in a clinic or care team's office. However, your care team may teach you how to inject yourself. Follow all instructions. Talk to your care team about the use of this medication in children. Special care may be needed. Overdosage: If you think you have taken too much of this medicine contact a poison control center or emergency room at once. NOTE: This medicine is only for you. Do not share this medicine with others. What if I miss a dose? If you are given your dose at a clinic or care team's office, call to reschedule your appointment. If you give your own injections, and you miss a dose, take it as soon as you can. If it is almost time for your next dose, take only that dose. Do not take double or extra doses. What may interact with this medication? Colchicine Heavy alcohol intake This list may not describe all possible interactions. Give your health  care provider a list of all the medicines, herbs, non-prescription drugs, or dietary supplements you use. Also tell them if you smoke, drink alcohol, or use illegal drugs. Some items may interact with your medicine. What should I watch for while using this medication? Visit your care team regularly. You may need blood work done while you are taking this medication. You may need to follow a special diet. Talk to your care team. Limit your alcohol intake and avoid smoking to get the best benefit. What side effects may I notice from receiving this medication? Side effects that you should report to your care team as soon as possible: Allergic reactions-skin rash, itching, hives, swelling of the face, lips, tongue, or throat Swelling of the ankles, hands, or feet Trouble breathing Side effects that usually do not require medical attention (report to your care team if they continue or are bothersome): Diarrhea This list may not describe all possible side effects. Call your doctor for medical advice about side effects. You may report side effects to FDA at 1-800-FDA-1088. Where should I keep my medication? Keep out of the reach of children. Store at room temperature between 15 and 30 degrees C (59 and 85 degrees F). Protect from light. Throw away any unused medication after the expiration date. NOTE: This sheet is a summary. It may not cover all possible information. If you have questions about this medicine, talk to your doctor, pharmacist, or health care provider.  2022 Elsevier/Gold Standard (2020-04-25 11:47:06)  Epoetin Alfa injection What is this medication? EPOETIN ALFA (e POE  e tin AL fa) helps your body make more red blood cells. This medicine is used to treat anemia caused by chronic kidney disease, cancer chemotherapy, or HIV-therapy. It may also be used before surgery if you have anemia. This medicine may be used for other purposes; ask your health care provider or pharmacist if you have  questions. COMMON BRAND NAME(S): Epogen, Procrit, Retacrit What should I tell my care team before I take this medication? They need to know if you have any of these conditions: cancer heart disease high blood pressure history of blood clots history of stroke low levels of folate, iron, or vitamin B12 in the blood seizures an unusual or allergic reaction to erythropoietin, albumin, benzyl alcohol, hamster proteins, other medicines, foods, dyes, or preservatives pregnant or trying to get pregnant breast-feeding How should I use this medication? This medicine is for injection into a vein or under the skin. It is usually given by a health care professional in a hospital or clinic setting. If you get this medicine at home, you will be taught how to prepare and give this medicine. Use exactly as directed. Take your medicine at regular intervals. Do not take your medicine more often than directed. It is important that you put your used needles and syringes in a special sharps container. Do not put them in a trash can. If you do not have a sharps container, call your pharmacist or healthcare provider to get one. A special MedGuide will be given to you by the pharmacist with each prescription and refill. Be sure to read this information carefully each time. Talk to your pediatrician regarding the use of this medicine in children. While this drug may be prescribed for selected conditions, precautions do apply. Overdosage: If you think you have taken too much of this medicine contact a poison control center or emergency room at once. NOTE: This medicine is only for you. Do not share this medicine with others. What if I miss a dose? If you miss a dose, take it as soon as you can. If it is almost time for your next dose, take only that dose. Do not take double or extra doses. What may interact with this medication? Interactions have not been studied. This list may not describe all possible interactions.  Give your health care provider a list of all the medicines, herbs, non-prescription drugs, or dietary supplements you use. Also tell them if you smoke, drink alcohol, or use illegal drugs. Some items may interact with your medicine. What should I watch for while using this medication? Your condition will be monitored carefully while you are receiving this medicine. You may need blood work done while you are taking this medicine. This medicine may cause a decrease in vitamin B6. You should make sure that you get enough vitamin B6 while you are taking this medicine. Discuss the foods you eat and the vitamins you take with your health care professional. What side effects may I notice from receiving this medication? Side effects that you should report to your doctor or health care professional as soon as possible: allergic reactions like skin rash, itching or hives, swelling of the face, lips, or tongue seizures signs and symptoms of a blood clot such as breathing problems; changes in vision; chest pain; severe, sudden headache; pain, swelling, warmth in the leg; trouble speaking; sudden numbness or weakness of the face, arm or leg signs and symptoms of a stroke like changes in vision; confusion; trouble speaking or understanding; severe headaches;  sudden numbness or weakness of the face, arm or leg; trouble walking; dizziness; loss of balance or coordination Side effects that usually do not require medical attention (report to your doctor or health care professional if they continue or are bothersome): chills cough dizziness fever headaches joint pain muscle cramps muscle pain nausea, vomiting pain, redness, or irritation at site where injected This list may not describe all possible side effects. Call your doctor for medical advice about side effects. You may report side effects to FDA at 1-800-FDA-1088. Where should I keep my medication? Keep out of the reach of children. Store in a  refrigerator between 2 and 8 degrees C (36 and 46 degrees F). Do not freeze or shake. Throw away any unused portion if using a single-dose vial. Multi-dose vials can be kept in the refrigerator for up to 21 days after the initial dose. Throw away unused medicine. NOTE: This sheet is a summary. It may not cover all possible information. If you have questions about this medicine, talk to your doctor, pharmacist, or health care provider.  2022 Elsevier/Gold Standard (2016-10-12 08:35:19)

## 2020-12-22 NOTE — Progress Notes (Signed)
[place]   [phone] [phone]   Clinic Follow up Note   Patient Care Team: Seward Carol, MD as PCP - General (Internal Medicine) Josue Hector, MD as PCP - Cardiology (Cardiology) Thelma Comp, [place] as Consulting Physician (Optometry) Stark Klein, MD as Consulting Physician (General Surgery) Truitt Merle, MD as Consulting Physician (Hematology) Madelon Lips, MD as Consulting Physician (Nephrology) [name], RN as [place] Management  Date of Service:  12/22/2020  CHIEF COMPLAINT: f/u of right breast cancer and anemia of chronic disease  CURRENT THERAPY:  -Retacrit injection starting 09/2018, currently q2weeks. Increased to 20K units q2weeks starting 06/29/19. 40K unit every 2 weeks on 08/13/19. Reduced to every 4 weeks from 02/01/20. Increased to every 2 weeks from 07/29/20.  -IV Feraheme as needed starting 09/15/18. Last on 08/10/19 -Oral Ferrous sulfate daily  -B12 injection monthly starting 05/04/19. Increased to every 2 weeks on 06/29/19. Reduced to every 4 weeks from 02/01/20. currently on every 6 weeks   ASSESSMENT & PLAN:  [name] is a 75 y.o. female with   1. Anemia of chronic disease (CKD stage IV), Iron and B12 deficiency and GI bleeding  -Her 04/2018 Colonoscopy with Dr Carlean Purl showed no evidence of GI bleeding or source of bleeding. Her anemia is probably related to her CKD, however her anemia is out of proportion of her stage IV CKD related anemia.  -Her 09/09/18 bone marrow biopsy results showed hypercellular marrow, no evidence of MDS or other concerns. Her Cytogenetics is still pending.  -She has been receiving IV iron as needed, for ferritin <100.  -Started receiving monthly B12 injections in our clinic on 05/04/19. currently on every 6 weeks  -Her insurance did not approve Aranesp so she was switched to Retacrit every 2 weeks on 11/14/18. Goal is Hg 9-11. Due to worsened anemia, her dose has  been increased, currently at 40,000 every 4 weeks. -labs reviewed, we will proceed with Retacrit today -Continue Retacrit every 2 weeks with lab (goal Hg 9-11). Will give IV iron if ferritin less than 100.  -continue B12 injection every 6 weeks  -F/u in 12 weeks    2. Breast cancer of the lower inner quadrant of the right breast, invasive ductal carcinoma with DCIS, pT1bN0Mo, stage IA,   ER+ and PR +, HER 2 negative -She was diagnosed in 02/2013 and treated with right lumpectomy, adjuvant radiation and Anastrozole for 4 years until 07/2017.  Anastrozole was stopped after she developed septic shock from pneumonia and a cardiac arrest, and we decided not restart due to the risk of cardiac toxicity. -Her 07/11/20 Mammogram showed suspicious right breast mass. She underwent breast biopsy on 07/28/20 which showed fibroadipose and necrosis, no malignancy.  -she is clinically doing well. Next mammogram in 06/2021.   3. Comorbidities: DM, HTN, CAD; Chronic renal insufficieny, CKD stage IV -Continue medications managed by PCP and Dr. Johnsie Cancel the cardiologist.  -She follows up with Dr. Hollie Salk for her kidney function. -She was hospitalized for CHF twice in 07/2020.  4. Genetics results negative    5. Bone Health, Low Back pain, from degenerative arthritis and spinal stenosis -DEXA from 08/08/20 was normal -Her VitD level is low. She will continue 1000u daily.  -back pain is tolerable and stable. Continue f/u with Orthopedics    6. Left Renal Vein Thrombosis in 07/2017 -Likely provoked from acute illness and prior SBO -She stopped Xarelto in 01/2018 -Her hypercoagulopathy work-up in November 2019 was negative  7. History of pneumonia and cardiac arrest in 07/2017  8. Covid-19+ 11/01/20, 03/26/20    PLAN: -Proceed with Retacrit and B12 today  -Lab and Retacrit every 2 weeks and B12 every 6 weeks, will give IV Iron if Ferritin less than 100.  -F/u in 12 weeks   No problem-specific Assessment & Plan  notes found for this encounter.   SUMMARY OF ONCOLOGIC HISTORY: Oncology History Overview Note  Cancer Staging Breast cancer of lower-inner quadrant of right female breast [facility]) Staging form: Breast, AJCC 7th Edition - Clinical: Stage IA (T1b, N0, cM0) - Unsigned Staging comments: Staged at breast conference 03/25/13.  - Pathologic stage from 05/12/2013: Stage IA (T1b, N0, cM0) - Signed by Truitt Merle, MD on 06/21/2016     Breast cancer of lower-inner quadrant of right female breast ([place])  02/27/2013 Mammogram   Diagnostic mammogram and ultrasound showed a 6 x 5 mm mass at 5:00 position. Axilla was negative by ultrasound.   03/16/2013 Initial Biopsy   Right breast 5:00 position biopsy showed invasive ductal carcinoma, low-grade   03/16/2013 Receptors her2   ER100% positive, PR 100% positive, HER-2 negative, Ki-67 20%   03/18/2013 Initial Diagnosis   Breast cancer of lower-inner quadrant of right female breast ([place])   05/12/2013 Surgery   Right breast lumpectomy with sentinel lymph node biopsy   05/12/2013 Pathology Results   Right breast lumpectomy showed invasive ductal carcinoma, grade 2, 0.9 cm, 6 sentinel lymph nodes were all negative. Margins were negative.   05/12/2013 Oncotype testing   Oncotype recurrence score 19, intermediate risk, 10 year risk of distant recurrence 12% with tamoxifen for 5 years. Patient declined adjuvant chemotherapy.   06/02/2013 - 07/16/2013 Radiation Therapy   Adjuvant breast radiation with boost 1600Gy   08/06/2013 - 07/2017 Anti-estrogen oral therapy   Arimidex 1 mg daily. Stopped in 07/2017 due to acute illness    03/22/2017 Mammogram   IMPRESSION: No mammographic evidence for malignancy.   Iron deficiency anemia     INTERVAL HISTORY:  [name] is here for a follow up of anemia. She was last seen by me on 07/29/20. She presents to the clinic accompanied by her daughter.    She was hospitalized on December 01, 2020 for acute on chronic  renal failure, her renal function improved after treatment.  I gave her 1 dose of Aranesp in the hospital.  She is overall feeling better since hospital discharge.  No chest pain, mild fatigue and dyspnea on exertion is stable. All other systems were reviewed with the patient and are negative.  MEDICAL HISTORY:  Past Medical History:  Diagnosis Date   Abdominal discomfort    Chronic N/V/D. Presumptive dx Crohn's dx per elevated p ANCA. Failed Entocort and Pentasa. Sep 2003 - ileocolectomy c anastomosis per Dr Deon Pilling 2/2 adhesions - path was hegative for Crohns. EGD, Sm bowel follow through (11/03), and an eteroclysis (10/03) were unrevealing. Cuases hypomag and hypocalcemia.   Adnexal mass 8/03   s/p lap BSO (R ovarian fibroma) & lysis of adhesions   Allergy    Seasonal   Anemia    Multifactorial. Baseline HgB 10-11 ish. B12 def - 150 in 3/10. Fe Def - ferritin 35 3/10. Both are being repleted.   Breast cancer ([place]) 03/16/13   right, 5 o'clock   CAD (coronary artery disease) 1996   1996 - PTCA and angioplasty diagonal branch. 2000 - Rotoblator & angiopllasty of diagonal. 2006 - subendocardial AMI, DES to proximal LAD.Marland Kitchen Also had  90% stenosis in distal apical LAD. EF 55 with apical hypokinesis. Indefinite ASA and Plavix.   CHF (congestive heart failure) (HCC)    Chronic kidney disease    Chronic renal insuff baseline Cr 1.2 - 1.4 ish.   Chronic pain    CT 10/10 = Spinal stenosis L2 - S1.   Diabetes mellitus    Insulin dependent   Gout    Hx of radiation therapy 06/02/13- 07/16/13   right rbeast 4500 cGy 25 sessions, right breast boost 1600 cGy in 8 sessions   Hyperlipidemia    Managed with both a statin and Welchol. Welchol stopped 2014 2/2 cost and started on fenofibrate    Hypertension    2006 B renal arteries patent. 2003 MRA - no RAS. 2003 pheo W/U Dr Hassell Done reportedly negative.   Hypoxia 07/23/2017   Lupus ([place])    Lymphedema of breast    Personal history of radiation therapy 2015    RBBB    Renal vein thrombosis (HCC)    SBO (small bowel obstruction) ([place]) 09/17/2017   Secondary hyperparathyroidism ([place])    Stroke ([place])    Incidental finding MRI 2002 L lacunar infarct   Vitamin B12 deficiency    Vitamin D deficiency    Wears dentures    top    SURGICAL HISTORY: Past Surgical History:  Procedure Laterality Date   ABDOMINAL HYSTERECTOMY     BILATERAL SALPINGOOPHORECTOMY  8/03   Lap BSO (R ovarian fibroma) and adhesion lysis   BIOPSY  09/08/2020   Procedure: BIOPSY;  Surgeon: Ronald Lobo, MD;  Location: [facility];  Service: Endoscopy;;   BOWEL RESECTION  2003   ileocolectomy with anastomosis 2/2 adhesions   BREAST BIOPSY Right 2015   BREAST BIOPSY Right 07/2014   BREAST LUMPECTOMY Right 04/22/2013   BREAST LUMPECTOMY WITH NEEDLE LOCALIZATION AND AXILLARY SENTINEL LYMPH NODE BX Right 04/22/2013   Procedure: BREAST LUMPECTOMY WITH NEEDLE LOCALIZATION AND AXILLARY SENTINEL LYMPH NODE BX;  Surgeon: Stark Klein, MD;  Location: [place];  Service: General;  Laterality: Right;   CARDIAC CATHETERIZATION     2 stents   CHOLECYSTECTOMY     COLONOSCOPY     ESOPHAGOGASTRODUODENOSCOPY N/A 09/08/2020   Procedure: ESOPHAGOGASTRODUODENOSCOPY (EGD);  Surgeon: Ronald Lobo, MD;  Location: [facility];  Service: Endoscopy;  Laterality: N/A;   HEMICOLECTOMY     R sided hemicolectomy   HERNIA REPAIR     Ventral hernia repair   PTCA  4/06    I have reviewed the social history and family history with the patient and they are unchanged from previous note.  ALLERGIES:  is allergic to percocet [oxycodone-acetaminophen]; diazepam; haloperidol lactate; lorazepam; morphine sulfate; propoxyphene hcl; tramadol hcl; anesthetics, halogenated; etomidate; fentanyl; haloperidol; morphine sulfate; oxycodone-acetaminophen; propoxyphene; tramadol hcl; and versed [midazolam].  MEDICATIONS:  Current Outpatient Medications  Medication Sig Dispense Refill    acetaminophen (TYLENOL) 500 MG tablet Take 1,000 mg by mouth every 6 (six) hours as needed for moderate pain or headache.     apixaban (ELIQUIS) 2.5 MG TABS tablet Take 1 tablet (2.5 mg total) by mouth 2 (two) times daily. 60 tablet 0   Artificial Tear Ointment (DRY EYES OP) Place 1 drop into the right eye 2 (two) times daily as needed (dry eye).     budesonide (ENTOCORT EC) 3 MG 24 hr capsule Take 1 capsule by mouth once daily (Patient taking differently: Take 3 mg by mouth daily.) 30 capsule 0   Cyanocobalamin (B-12 COMPLIANCE INJECTION IJ) Inject  1 vial as directed every 30 (thirty) days.     Epoetin Alfa-epbx (RETACRIT IJ) Inject 1 vial as directed every 30 (thirty) days.     ergocalciferol (VITAMIN D2) 1.25 MG (50000 UT) capsule Take 1 capsule (50,000 Units total) by mouth once a week. 4 capsule 1   famotidine (PEPCID) 10 MG tablet Take 1 tablet (10 mg total) by mouth at bedtime. 30 tablet 0   furosemide (LASIX) 20 MG tablet Take 1 tablet (20 mg total) by mouth daily as needed (weight gain of >3Lbs in 1 day or >5 Lbs in 2 days.). 30 tablet 0   lipase/protease/amylase (CREON) 36000 UNITS CPEP capsule Take 36,000 Units by mouth 3 (three) times daily with meals.     loperamide (IMODIUM A-D) 2 MG tablet Take 4 mg by mouth 4 (four) times daily as needed for diarrhea or loose stools.     melatonin 5 MG TABS Take 1 tablet (5 mg total) by mouth at bedtime as needed (sleep).  0   mirtazapine (REMERON) 7.5 MG tablet Take 1 tablet (7.5 mg total) by mouth at bedtime. 90 tablet 1   nitroGLYCERIN (NITROSTAT) 0.4 MG SL tablet DISSOLVE 1 TABLET UNDER THE TONGUE EVERY 5 MINUTES AS  NEEDED FOR CHEST PAIN. MAX  OF 3 TABLETS IN 15 MINUTES. CALL 911 IF PAIN PERSISTS. (Patient taking differently: Place 0.4 mg under the tongue every 5 (five) minutes as needed for chest pain.) 25 tablet 0   Nutritional Supplements (FEEDING SUPPLEMENT, NEPRO CARB STEADY,) LIQD Take 237 mLs by mouth 2 (two) times daily between meals. 10000  mL 0   ondansetron (ZOFRAN ODT) 4 MG disintegrating tablet Take 1 tablet (4 mg total) by mouth every 8 (eight) hours as needed for nausea or vomiting. 20 tablet 0   pantoprazole (PROTONIX) 40 MG tablet TAKE 1 TABLET BY MOUTH  DAILY BEFORE BREAKFAST (Patient taking differently: Take 40 mg by mouth daily.) 90 tablet 3   rosuvastatin (CRESTOR) 40 MG tablet Take 40 mg by mouth daily.     sodium bicarbonate 650 MG tablet Take 2 tablets (1,300 mg total) by mouth 2 (two) times daily. 90 tablet 0   sucralfate (CARAFATE) 1 g tablet Take 1 g by mouth 4 (four) times daily -  with meals and at bedtime.     No current facility-administered medications for this visit.    PHYSICAL EXAMINATION: ECOG PERFORMANCE STATUS: 2 - Symptomatic, <50% confined to bed  Vitals:   12/22/20 1326  BP: (!) 150/68  Pulse: 83  Resp: 18  Temp: 98.4 F (36.9 C)  SpO2: 99%   Wt Readings from Last 3 Encounters:  12/22/20 144 lb 9.6 oz (65.6 kg)  12/10/20 132 lb 15 oz (60.3 kg)  11/19/20 130 lb 1.1 oz (59 kg)     GENERAL:alert, no distress and comfortable SKIN: skin color, texture, turgor are normal, no rashes or significant lesions EYES: normal, Conjunctiva are pink and non-injected, sclera clear ABDOMEN:abdomen soft, non-tender and normal bowel sounds Musculoskeletal:no cyanosis of digits and no clubbing  NEURO: alert & oriented x 3 with fluent speech, no focal motor/sensory deficits  LABORATORY DATA:  I have reviewed the data as listed CBC Latest Ref Rng & Units 12/22/2020 12/07/2020 12/04/2020  WBC 4.0 - 10.5 K/uL 6.2 5.4 5.3  Hemoglobin 12.0 - 15.0 g/dL 10.0(L) 9.7(L) 10.6(L)  Hematocrit 36.0 - 46.0 % 32.6(L) 31.4(L) 33.7(L)  Platelets 150 - 400 K/uL 237 168 209     CMP Latest Ref Rng & Units  12/22/2020 12/10/2020 12/09/2020  Glucose 70 - 99 mg/dL 127(H) 83 120(H)  BUN 8 - 23 mg/dL 49(H) 33(H) 36(H)  Creatinine 0.44 - 1.00 mg/dL 2.66(H) 2.15(H) 2.25(H)  Sodium 135 - 145 mmol/L 143 139 140  Potassium 3.5 -  5.1 mmol/L 3.6 3.5 4.6  Chloride 98 - 111 mmol/L 111 108 108  CO2 22 - 32 mmol/L _0 Calcium 8.9 - 10.3 mg/dL 9.1 8.5(L) 9.1  Total Protein 6.5 - 8.1 g/dL 6.3(L) - -  Total Bilirubin 0.3 - 1.2 mg/dL 0.4 - -  Alkaline Phos 38 - 126 U/L 79 - -  AST 15 - 41 U/L 63(H) - -  ALT 0 - 44 U/L 60(H) - -      RADIOGRAPHIC STUDIES: I have personally reviewed the radiological images as listed and agreed with the findings in the report. No results found.    No orders of the defined types were placed in this encounter.  All questions were answered. The patient knows to call the clinic with any problems, questions or concerns. No barriers to learning was detected. The total time spent in the appointment was 20 minutes.     Truitt Merle, MD 12/22/2020   I, Wilburn Mylar, am acting as scribe for Truitt Merle, MD.   I have reviewed the above documentation for accuracy and completeness, and I agree with the above.

## 2020-12-23 ENCOUNTER — Other Ambulatory Visit: Payer: Self-pay | Admitting: *Deleted

## 2020-12-23 DIAGNOSIS — E538 Deficiency of other specified B group vitamins: Secondary | ICD-10-CM | POA: Diagnosis not present

## 2020-12-23 DIAGNOSIS — G8929 Other chronic pain: Secondary | ICD-10-CM | POA: Diagnosis not present

## 2020-12-23 DIAGNOSIS — E1151 Type 2 diabetes mellitus with diabetic peripheral angiopathy without gangrene: Secondary | ICD-10-CM | POA: Diagnosis not present

## 2020-12-23 DIAGNOSIS — N184 Chronic kidney disease, stage 4 (severe): Secondary | ICD-10-CM | POA: Diagnosis not present

## 2020-12-23 DIAGNOSIS — E1169 Type 2 diabetes mellitus with other specified complication: Secondary | ICD-10-CM | POA: Diagnosis not present

## 2020-12-23 DIAGNOSIS — F32A Depression, unspecified: Secondary | ICD-10-CM | POA: Diagnosis not present

## 2020-12-23 DIAGNOSIS — E785 Hyperlipidemia, unspecified: Secondary | ICD-10-CM | POA: Diagnosis not present

## 2020-12-23 DIAGNOSIS — M109 Gout, unspecified: Secondary | ICD-10-CM | POA: Diagnosis not present

## 2020-12-23 DIAGNOSIS — E440 Moderate protein-calorie malnutrition: Secondary | ICD-10-CM | POA: Diagnosis not present

## 2020-12-23 DIAGNOSIS — M48062 Spinal stenosis, lumbar region with neurogenic claudication: Secondary | ICD-10-CM | POA: Diagnosis not present

## 2020-12-23 DIAGNOSIS — E1140 Type 2 diabetes mellitus with diabetic neuropathy, unspecified: Secondary | ICD-10-CM | POA: Diagnosis not present

## 2020-12-23 DIAGNOSIS — I2510 Atherosclerotic heart disease of native coronary artery without angina pectoris: Secondary | ICD-10-CM | POA: Diagnosis not present

## 2020-12-23 DIAGNOSIS — L930 Discoid lupus erythematosus: Secondary | ICD-10-CM | POA: Diagnosis not present

## 2020-12-23 DIAGNOSIS — Z7901 Long term (current) use of anticoagulants: Secondary | ICD-10-CM | POA: Diagnosis not present

## 2020-12-23 DIAGNOSIS — D631 Anemia in chronic kidney disease: Secondary | ICD-10-CM | POA: Diagnosis not present

## 2020-12-23 DIAGNOSIS — I872 Venous insufficiency (chronic) (peripheral): Secondary | ICD-10-CM | POA: Diagnosis not present

## 2020-12-23 DIAGNOSIS — E1122 Type 2 diabetes mellitus with diabetic chronic kidney disease: Secondary | ICD-10-CM | POA: Diagnosis not present

## 2020-12-23 DIAGNOSIS — I130 Hypertensive heart and chronic kidney disease with heart failure and stage 1 through stage 4 chronic kidney disease, or unspecified chronic kidney disease: Secondary | ICD-10-CM | POA: Diagnosis not present

## 2020-12-23 DIAGNOSIS — I4510 Unspecified right bundle-branch block: Secondary | ICD-10-CM | POA: Diagnosis not present

## 2020-12-23 DIAGNOSIS — I5032 Chronic diastolic (congestive) heart failure: Secondary | ICD-10-CM | POA: Diagnosis not present

## 2020-12-23 DIAGNOSIS — N2581 Secondary hyperparathyroidism of renal origin: Secondary | ICD-10-CM | POA: Diagnosis not present

## 2020-12-23 DIAGNOSIS — R634 Abnormal weight loss: Secondary | ICD-10-CM | POA: Diagnosis not present

## 2020-12-23 NOTE — Patient Outreach (Signed)
[facility]) Care Management  12/23/2020  [name] 01/25/1946 [id number]   Case discussed with multidisciplinary team due to multiple admissions.  Family working on 24 hour supervision, this RNCM will follow up with member/family within the next week.  [name], [place], MSN [place] [phone]

## 2020-12-27 DIAGNOSIS — F32A Depression, unspecified: Secondary | ICD-10-CM | POA: Diagnosis not present

## 2020-12-27 DIAGNOSIS — R634 Abnormal weight loss: Secondary | ICD-10-CM | POA: Diagnosis not present

## 2020-12-27 DIAGNOSIS — E1122 Type 2 diabetes mellitus with diabetic chronic kidney disease: Secondary | ICD-10-CM | POA: Diagnosis not present

## 2020-12-27 DIAGNOSIS — E785 Hyperlipidemia, unspecified: Secondary | ICD-10-CM | POA: Diagnosis not present

## 2020-12-27 DIAGNOSIS — E1140 Type 2 diabetes mellitus with diabetic neuropathy, unspecified: Secondary | ICD-10-CM | POA: Diagnosis not present

## 2020-12-27 DIAGNOSIS — G8929 Other chronic pain: Secondary | ICD-10-CM | POA: Diagnosis not present

## 2020-12-27 DIAGNOSIS — I2510 Atherosclerotic heart disease of native coronary artery without angina pectoris: Secondary | ICD-10-CM | POA: Diagnosis not present

## 2020-12-27 DIAGNOSIS — I872 Venous insufficiency (chronic) (peripheral): Secondary | ICD-10-CM | POA: Diagnosis not present

## 2020-12-27 DIAGNOSIS — Z7901 Long term (current) use of anticoagulants: Secondary | ICD-10-CM | POA: Diagnosis not present

## 2020-12-27 DIAGNOSIS — Z23 Encounter for immunization: Secondary | ICD-10-CM | POA: Diagnosis not present

## 2020-12-27 DIAGNOSIS — N179 Acute kidney failure, unspecified: Secondary | ICD-10-CM | POA: Diagnosis not present

## 2020-12-27 DIAGNOSIS — I10 Essential (primary) hypertension: Secondary | ICD-10-CM | POA: Diagnosis not present

## 2020-12-27 DIAGNOSIS — E538 Deficiency of other specified B group vitamins: Secondary | ICD-10-CM | POA: Diagnosis not present

## 2020-12-27 DIAGNOSIS — D631 Anemia in chronic kidney disease: Secondary | ICD-10-CM | POA: Diagnosis not present

## 2020-12-27 DIAGNOSIS — I4510 Unspecified right bundle-branch block: Secondary | ICD-10-CM | POA: Diagnosis not present

## 2020-12-27 DIAGNOSIS — I5189 Other ill-defined heart diseases: Secondary | ICD-10-CM | POA: Diagnosis not present

## 2020-12-27 DIAGNOSIS — I130 Hypertensive heart and chronic kidney disease with heart failure and stage 1 through stage 4 chronic kidney disease, or unspecified chronic kidney disease: Secondary | ICD-10-CM | POA: Diagnosis not present

## 2020-12-27 DIAGNOSIS — E440 Moderate protein-calorie malnutrition: Secondary | ICD-10-CM | POA: Diagnosis not present

## 2020-12-27 DIAGNOSIS — D509 Iron deficiency anemia, unspecified: Secondary | ICD-10-CM | POA: Diagnosis not present

## 2020-12-27 DIAGNOSIS — E1169 Type 2 diabetes mellitus with other specified complication: Secondary | ICD-10-CM | POA: Diagnosis not present

## 2020-12-27 DIAGNOSIS — I5032 Chronic diastolic (congestive) heart failure: Secondary | ICD-10-CM | POA: Diagnosis not present

## 2020-12-27 DIAGNOSIS — N2581 Secondary hyperparathyroidism of renal origin: Secondary | ICD-10-CM | POA: Diagnosis not present

## 2020-12-27 DIAGNOSIS — M48062 Spinal stenosis, lumbar region with neurogenic claudication: Secondary | ICD-10-CM | POA: Diagnosis not present

## 2020-12-27 DIAGNOSIS — E1151 Type 2 diabetes mellitus with diabetic peripheral angiopathy without gangrene: Secondary | ICD-10-CM | POA: Diagnosis not present

## 2020-12-27 DIAGNOSIS — N184 Chronic kidney disease, stage 4 (severe): Secondary | ICD-10-CM | POA: Diagnosis not present

## 2020-12-27 DIAGNOSIS — M109 Gout, unspecified: Secondary | ICD-10-CM | POA: Diagnosis not present

## 2020-12-27 DIAGNOSIS — L930 Discoid lupus erythematosus: Secondary | ICD-10-CM | POA: Diagnosis not present

## 2020-12-27 DIAGNOSIS — I82551 Chronic embolism and thrombosis of right peroneal vein: Secondary | ICD-10-CM | POA: Diagnosis not present

## 2020-12-28 DIAGNOSIS — N184 Chronic kidney disease, stage 4 (severe): Secondary | ICD-10-CM | POA: Diagnosis not present

## 2020-12-28 DIAGNOSIS — L930 Discoid lupus erythematosus: Secondary | ICD-10-CM | POA: Diagnosis not present

## 2020-12-28 DIAGNOSIS — E1169 Type 2 diabetes mellitus with other specified complication: Secondary | ICD-10-CM | POA: Diagnosis not present

## 2020-12-28 DIAGNOSIS — I2510 Atherosclerotic heart disease of native coronary artery without angina pectoris: Secondary | ICD-10-CM | POA: Diagnosis not present

## 2020-12-28 DIAGNOSIS — Z7901 Long term (current) use of anticoagulants: Secondary | ICD-10-CM | POA: Diagnosis not present

## 2020-12-28 DIAGNOSIS — F32A Depression, unspecified: Secondary | ICD-10-CM | POA: Diagnosis not present

## 2020-12-28 DIAGNOSIS — G8929 Other chronic pain: Secondary | ICD-10-CM | POA: Diagnosis not present

## 2020-12-28 DIAGNOSIS — E1151 Type 2 diabetes mellitus with diabetic peripheral angiopathy without gangrene: Secondary | ICD-10-CM | POA: Diagnosis not present

## 2020-12-28 DIAGNOSIS — R634 Abnormal weight loss: Secondary | ICD-10-CM | POA: Diagnosis not present

## 2020-12-28 DIAGNOSIS — E440 Moderate protein-calorie malnutrition: Secondary | ICD-10-CM | POA: Diagnosis not present

## 2020-12-28 DIAGNOSIS — D631 Anemia in chronic kidney disease: Secondary | ICD-10-CM | POA: Diagnosis not present

## 2020-12-28 DIAGNOSIS — I130 Hypertensive heart and chronic kidney disease with heart failure and stage 1 through stage 4 chronic kidney disease, or unspecified chronic kidney disease: Secondary | ICD-10-CM | POA: Diagnosis not present

## 2020-12-28 DIAGNOSIS — M109 Gout, unspecified: Secondary | ICD-10-CM | POA: Diagnosis not present

## 2020-12-28 DIAGNOSIS — I5032 Chronic diastolic (congestive) heart failure: Secondary | ICD-10-CM | POA: Diagnosis not present

## 2020-12-28 DIAGNOSIS — E1140 Type 2 diabetes mellitus with diabetic neuropathy, unspecified: Secondary | ICD-10-CM | POA: Diagnosis not present

## 2020-12-28 DIAGNOSIS — E538 Deficiency of other specified B group vitamins: Secondary | ICD-10-CM | POA: Diagnosis not present

## 2020-12-28 DIAGNOSIS — M48062 Spinal stenosis, lumbar region with neurogenic claudication: Secondary | ICD-10-CM | POA: Diagnosis not present

## 2020-12-28 DIAGNOSIS — I872 Venous insufficiency (chronic) (peripheral): Secondary | ICD-10-CM | POA: Diagnosis not present

## 2020-12-28 DIAGNOSIS — E1122 Type 2 diabetes mellitus with diabetic chronic kidney disease: Secondary | ICD-10-CM | POA: Diagnosis not present

## 2020-12-28 DIAGNOSIS — I4510 Unspecified right bundle-branch block: Secondary | ICD-10-CM | POA: Diagnosis not present

## 2020-12-28 DIAGNOSIS — E785 Hyperlipidemia, unspecified: Secondary | ICD-10-CM | POA: Diagnosis not present

## 2020-12-28 DIAGNOSIS — N2581 Secondary hyperparathyroidism of renal origin: Secondary | ICD-10-CM | POA: Diagnosis not present

## 2020-12-29 ENCOUNTER — Other Ambulatory Visit: Payer: Self-pay | Admitting: *Deleted

## 2020-12-29 NOTE — Patient Outreach (Signed)
[facility] Care Management  12/29/2020  [name] 06-Jun-1945 [id number]   Outgoing call placed to member's daughter [name], no answer, HIPAA compliant voice message left.  Will follow up within the next 3-4 business days.  [name], [place], MSN [place] [phone]

## 2020-12-30 ENCOUNTER — Ambulatory Visit: Payer: Self-pay | Admitting: *Deleted

## 2020-12-30 DIAGNOSIS — D631 Anemia in chronic kidney disease: Secondary | ICD-10-CM | POA: Diagnosis not present

## 2020-12-30 DIAGNOSIS — R634 Abnormal weight loss: Secondary | ICD-10-CM | POA: Diagnosis not present

## 2020-12-30 DIAGNOSIS — Z7901 Long term (current) use of anticoagulants: Secondary | ICD-10-CM | POA: Diagnosis not present

## 2020-12-30 DIAGNOSIS — N2581 Secondary hyperparathyroidism of renal origin: Secondary | ICD-10-CM | POA: Diagnosis not present

## 2020-12-30 DIAGNOSIS — L930 Discoid lupus erythematosus: Secondary | ICD-10-CM | POA: Diagnosis not present

## 2020-12-30 DIAGNOSIS — E1140 Type 2 diabetes mellitus with diabetic neuropathy, unspecified: Secondary | ICD-10-CM | POA: Diagnosis not present

## 2020-12-30 DIAGNOSIS — E1169 Type 2 diabetes mellitus with other specified complication: Secondary | ICD-10-CM | POA: Diagnosis not present

## 2020-12-30 DIAGNOSIS — M48062 Spinal stenosis, lumbar region with neurogenic claudication: Secondary | ICD-10-CM | POA: Diagnosis not present

## 2020-12-30 DIAGNOSIS — I5032 Chronic diastolic (congestive) heart failure: Secondary | ICD-10-CM | POA: Diagnosis not present

## 2020-12-30 DIAGNOSIS — I872 Venous insufficiency (chronic) (peripheral): Secondary | ICD-10-CM | POA: Diagnosis not present

## 2020-12-30 DIAGNOSIS — N184 Chronic kidney disease, stage 4 (severe): Secondary | ICD-10-CM | POA: Diagnosis not present

## 2020-12-30 DIAGNOSIS — E538 Deficiency of other specified B group vitamins: Secondary | ICD-10-CM | POA: Diagnosis not present

## 2020-12-30 DIAGNOSIS — I130 Hypertensive heart and chronic kidney disease with heart failure and stage 1 through stage 4 chronic kidney disease, or unspecified chronic kidney disease: Secondary | ICD-10-CM | POA: Diagnosis not present

## 2020-12-30 DIAGNOSIS — F32A Depression, unspecified: Secondary | ICD-10-CM | POA: Diagnosis not present

## 2020-12-30 DIAGNOSIS — I2510 Atherosclerotic heart disease of native coronary artery without angina pectoris: Secondary | ICD-10-CM | POA: Diagnosis not present

## 2020-12-30 DIAGNOSIS — E785 Hyperlipidemia, unspecified: Secondary | ICD-10-CM | POA: Diagnosis not present

## 2020-12-30 DIAGNOSIS — I4510 Unspecified right bundle-branch block: Secondary | ICD-10-CM | POA: Diagnosis not present

## 2020-12-30 DIAGNOSIS — E1122 Type 2 diabetes mellitus with diabetic chronic kidney disease: Secondary | ICD-10-CM | POA: Diagnosis not present

## 2020-12-30 DIAGNOSIS — M109 Gout, unspecified: Secondary | ICD-10-CM | POA: Diagnosis not present

## 2020-12-30 DIAGNOSIS — E1151 Type 2 diabetes mellitus with diabetic peripheral angiopathy without gangrene: Secondary | ICD-10-CM | POA: Diagnosis not present

## 2020-12-30 DIAGNOSIS — E440 Moderate protein-calorie malnutrition: Secondary | ICD-10-CM | POA: Diagnosis not present

## 2020-12-30 DIAGNOSIS — G8929 Other chronic pain: Secondary | ICD-10-CM | POA: Diagnosis not present

## 2021-01-03 DIAGNOSIS — G8929 Other chronic pain: Secondary | ICD-10-CM | POA: Diagnosis not present

## 2021-01-03 DIAGNOSIS — I5032 Chronic diastolic (congestive) heart failure: Secondary | ICD-10-CM | POA: Diagnosis not present

## 2021-01-03 DIAGNOSIS — E538 Deficiency of other specified B group vitamins: Secondary | ICD-10-CM | POA: Diagnosis not present

## 2021-01-03 DIAGNOSIS — N184 Chronic kidney disease, stage 4 (severe): Secondary | ICD-10-CM | POA: Diagnosis not present

## 2021-01-03 DIAGNOSIS — D631 Anemia in chronic kidney disease: Secondary | ICD-10-CM | POA: Diagnosis not present

## 2021-01-03 DIAGNOSIS — E1122 Type 2 diabetes mellitus with diabetic chronic kidney disease: Secondary | ICD-10-CM | POA: Diagnosis not present

## 2021-01-03 DIAGNOSIS — L930 Discoid lupus erythematosus: Secondary | ICD-10-CM | POA: Diagnosis not present

## 2021-01-03 DIAGNOSIS — F32A Depression, unspecified: Secondary | ICD-10-CM | POA: Diagnosis not present

## 2021-01-03 DIAGNOSIS — E1140 Type 2 diabetes mellitus with diabetic neuropathy, unspecified: Secondary | ICD-10-CM | POA: Diagnosis not present

## 2021-01-03 DIAGNOSIS — E1151 Type 2 diabetes mellitus with diabetic peripheral angiopathy without gangrene: Secondary | ICD-10-CM | POA: Diagnosis not present

## 2021-01-03 DIAGNOSIS — I130 Hypertensive heart and chronic kidney disease with heart failure and stage 1 through stage 4 chronic kidney disease, or unspecified chronic kidney disease: Secondary | ICD-10-CM | POA: Diagnosis not present

## 2021-01-03 DIAGNOSIS — E440 Moderate protein-calorie malnutrition: Secondary | ICD-10-CM | POA: Diagnosis not present

## 2021-01-03 DIAGNOSIS — Z7901 Long term (current) use of anticoagulants: Secondary | ICD-10-CM | POA: Diagnosis not present

## 2021-01-03 DIAGNOSIS — I4510 Unspecified right bundle-branch block: Secondary | ICD-10-CM | POA: Diagnosis not present

## 2021-01-03 DIAGNOSIS — M109 Gout, unspecified: Secondary | ICD-10-CM | POA: Diagnosis not present

## 2021-01-03 DIAGNOSIS — I2510 Atherosclerotic heart disease of native coronary artery without angina pectoris: Secondary | ICD-10-CM | POA: Diagnosis not present

## 2021-01-03 DIAGNOSIS — R634 Abnormal weight loss: Secondary | ICD-10-CM | POA: Diagnosis not present

## 2021-01-03 DIAGNOSIS — N2581 Secondary hyperparathyroidism of renal origin: Secondary | ICD-10-CM | POA: Diagnosis not present

## 2021-01-03 DIAGNOSIS — E1169 Type 2 diabetes mellitus with other specified complication: Secondary | ICD-10-CM | POA: Diagnosis not present

## 2021-01-03 DIAGNOSIS — M48062 Spinal stenosis, lumbar region with neurogenic claudication: Secondary | ICD-10-CM | POA: Diagnosis not present

## 2021-01-03 DIAGNOSIS — E785 Hyperlipidemia, unspecified: Secondary | ICD-10-CM | POA: Diagnosis not present

## 2021-01-03 DIAGNOSIS — I872 Venous insufficiency (chronic) (peripheral): Secondary | ICD-10-CM | POA: Diagnosis not present

## 2021-01-04 ENCOUNTER — Other Ambulatory Visit: Payer: Self-pay | Admitting: *Deleted

## 2021-01-04 DIAGNOSIS — N184 Chronic kidney disease, stage 4 (severe): Secondary | ICD-10-CM | POA: Diagnosis not present

## 2021-01-04 DIAGNOSIS — F32A Depression, unspecified: Secondary | ICD-10-CM | POA: Diagnosis not present

## 2021-01-04 DIAGNOSIS — G8929 Other chronic pain: Secondary | ICD-10-CM | POA: Diagnosis not present

## 2021-01-04 DIAGNOSIS — E1169 Type 2 diabetes mellitus with other specified complication: Secondary | ICD-10-CM | POA: Diagnosis not present

## 2021-01-04 DIAGNOSIS — I130 Hypertensive heart and chronic kidney disease with heart failure and stage 1 through stage 4 chronic kidney disease, or unspecified chronic kidney disease: Secondary | ICD-10-CM | POA: Diagnosis not present

## 2021-01-04 DIAGNOSIS — M48062 Spinal stenosis, lumbar region with neurogenic claudication: Secondary | ICD-10-CM | POA: Diagnosis not present

## 2021-01-04 DIAGNOSIS — E785 Hyperlipidemia, unspecified: Secondary | ICD-10-CM | POA: Diagnosis not present

## 2021-01-04 DIAGNOSIS — I4510 Unspecified right bundle-branch block: Secondary | ICD-10-CM | POA: Diagnosis not present

## 2021-01-04 DIAGNOSIS — E440 Moderate protein-calorie malnutrition: Secondary | ICD-10-CM | POA: Diagnosis not present

## 2021-01-04 DIAGNOSIS — M109 Gout, unspecified: Secondary | ICD-10-CM | POA: Diagnosis not present

## 2021-01-04 DIAGNOSIS — L930 Discoid lupus erythematosus: Secondary | ICD-10-CM | POA: Diagnosis not present

## 2021-01-04 DIAGNOSIS — N2581 Secondary hyperparathyroidism of renal origin: Secondary | ICD-10-CM | POA: Diagnosis not present

## 2021-01-04 DIAGNOSIS — I872 Venous insufficiency (chronic) (peripheral): Secondary | ICD-10-CM | POA: Diagnosis not present

## 2021-01-04 DIAGNOSIS — E1151 Type 2 diabetes mellitus with diabetic peripheral angiopathy without gangrene: Secondary | ICD-10-CM | POA: Diagnosis not present

## 2021-01-04 DIAGNOSIS — I2510 Atherosclerotic heart disease of native coronary artery without angina pectoris: Secondary | ICD-10-CM | POA: Diagnosis not present

## 2021-01-04 DIAGNOSIS — R634 Abnormal weight loss: Secondary | ICD-10-CM | POA: Diagnosis not present

## 2021-01-04 DIAGNOSIS — I5032 Chronic diastolic (congestive) heart failure: Secondary | ICD-10-CM | POA: Diagnosis not present

## 2021-01-04 DIAGNOSIS — E1140 Type 2 diabetes mellitus with diabetic neuropathy, unspecified: Secondary | ICD-10-CM | POA: Diagnosis not present

## 2021-01-04 DIAGNOSIS — E1122 Type 2 diabetes mellitus with diabetic chronic kidney disease: Secondary | ICD-10-CM | POA: Diagnosis not present

## 2021-01-04 DIAGNOSIS — E538 Deficiency of other specified B group vitamins: Secondary | ICD-10-CM | POA: Diagnosis not present

## 2021-01-04 DIAGNOSIS — Z7901 Long term (current) use of anticoagulants: Secondary | ICD-10-CM | POA: Diagnosis not present

## 2021-01-04 DIAGNOSIS — D631 Anemia in chronic kidney disease: Secondary | ICD-10-CM | POA: Diagnosis not present

## 2021-01-04 NOTE — Patient Outreach (Signed)
[facility]) Care Management  [place]  01/04/2021   Krisi [name] [place] 04-29-1945 [id number]   Outreach attempt #2, successful to daughter.  She has received email with forms for in home aide support however she is unable to open the email due to security of her computer.  Will mail forms to daughter.  Denies any urgent concerns, encouraged to contact this care manager with questions.    Encounter Medications:  Outpatient Encounter Medications as of 01/04/2021  Medication Sig Note   acetaminophen (TYLENOL) 500 MG tablet Take 1,000 mg by mouth every 6 (six) hours as needed for moderate pain or headache.    apixaban (ELIQUIS) 2.5 MG TABS tablet Take 1 tablet (2.5 mg total) by mouth 2 (two) times daily.    Artificial Tear Ointment (DRY EYES OP) Place 1 drop into the right eye 2 (two) times daily as needed (dry eye).    budesonide (ENTOCORT EC) 3 MG 24 hr capsule Take 1 capsule by mouth once daily (Patient taking differently: Take 3 mg by mouth daily.)    Cyanocobalamin (B-12 COMPLIANCE INJECTION IJ) Inject 1 vial as directed every 30 (thirty) days. 12/02/2020: Injection is due 12/02/20   Epoetin Alfa-epbx (RETACRIT IJ) Inject 1 vial as directed every 30 (thirty) days. 12/02/2020: Injection is due 12/02/20   ergocalciferol (VITAMIN D2) 1.25 MG (50000 UT) capsule Take 1 capsule (50,000 Units total) by mouth once a week.    famotidine (PEPCID) 10 MG tablet Take 1 tablet (10 mg total) by mouth at bedtime.    furosemide (LASIX) 20 MG tablet Take 1 tablet (20 mg total) by mouth daily as needed (weight gain of >3Lbs in 1 day or >5 Lbs in 2 days.).    lipase/protease/amylase (CREON) 36000 UNITS CPEP capsule Take 36,000 Units by mouth 3 (three) times daily with meals.    loperamide (IMODIUM A-D) 2 MG tablet Take 4 mg by mouth 4 (four) times daily as needed for diarrhea or loose stools.    melatonin 5 MG TABS Take 1 tablet (5 mg total) by mouth at bedtime as needed (sleep).     mirtazapine (REMERON) 7.5 MG tablet Take 1 tablet (7.5 mg total) by mouth at bedtime.    nitroGLYCERIN (NITROSTAT) 0.4 MG SL tablet DISSOLVE 1 TABLET UNDER THE TONGUE EVERY 5 MINUTES AS  NEEDED FOR CHEST PAIN. MAX  OF 3 TABLETS IN 15 MINUTES. CALL 911 IF PAIN PERSISTS. (Patient taking differently: Place 0.4 mg under the tongue every 5 (five) minutes as needed for chest pain.)    Nutritional Supplements (FEEDING SUPPLEMENT, NEPRO CARB STEADY,) LIQD Take 237 mLs by mouth 2 (two) times daily between meals.    ondansetron (ZOFRAN ODT) 4 MG disintegrating tablet Take 1 tablet (4 mg total) by mouth every 8 (eight) hours as needed for nausea or vomiting.    pantoprazole (PROTONIX) 40 MG tablet TAKE 1 TABLET BY MOUTH  DAILY BEFORE BREAKFAST (Patient taking differently: Take 40 mg by mouth daily.)    rosuvastatin (CRESTOR) 40 MG tablet Take 40 mg by mouth daily.    sodium bicarbonate 650 MG tablet Take 2 tablets (1,300 mg total) by mouth 2 (two) times daily.    sucralfate (CARAFATE) 1 g tablet Take 1 g by mouth 4 (four) times daily -  with meals and at bedtime.    No facility-administered encounter medications on file as of 01/04/2021.    Functional Status:  In your present state of health, do you have any difficulty performing the  following activities: 12/02/2020 11/02/2020  Hearing? N N  Vision? N N  Difficulty concentrating or making decisions? Y N  Walking or climbing stairs? Y Y  Dressing or bathing? N N  Doing errands, shopping? Y N  Some recent data might be hidden    Fall/Depression Screening: Fall Risk  09/12/2020 07/19/2020 06/21/2020  Falls in the past year? 1 0 1  Comment - Fell at home April 2022. Tripped over cane left arm pain. -  Number falls in past yr: 1 0 1  Injury with Fall? 0 0 1  Comment - - exaserbated right leg pain  Risk for fall due to : History of fall(s) - -  Follow up - - -   PHQ 2/9 Scores 09/12/2020 07/19/2020 07/23/2017 04/18/2017 12/20/2016 10/16/2016 10/11/2016  PHQ - 2  Score 0 0 0 0 0 0 0    Assessment:   Care Plan Care Plan : Chronic Kidney (Adult)  Updates made by [name], RN since 01/04/2021 12:00 AM     Problem: Disease Progression      Goal: Disease Progression Prevented or Minimized   Start Date: 11/28/2020  Expected End Date: 01/27/2021  This Visit's Progress: On track  Recent Progress: Not on track  Priority: Medium  Note:   Evidence-based guidance:  Discuss potential for disease progression based on clinical diagnosis and/or causation and other risk factors including race, ethnicity, age, comorbidities, lifestyle.  Encourage lifestyle changes including maintaining a healthy weight, exercise and activity, limiting alcohol consumption, smoking cessation to improve long-term outcomes.  Acknowledge difficulty in making life-long behavior change; provide guidance to incorporate lifestyle change into daily life and social activities; provide emotional support, coaching and praise for success.  Anticipate the use of pharmacologic therapy to prevent disease progression and manage complications or comorbidities, such as hypertension, cardiovascular disease, diabetes, anemia, bone disorders, peripheral vascular disease.  Monitor efficacy of pharmacologic therapy; monitor and manage side effects.  Monitor for nephrotoxic medication or supplement use including NSAIDs (nonsteroidal anti-inflammatory drugs), radio-contrast media, oral phosphate-containing bowel preparations, herbal supplements, protein supplements.  Encourage consultation and counseling with pharmacist.  Anticipate and prepare patient for early nephrology referral if significant increase of ACR (albumin to creatinine ratio), hard-to-manage complications of decreased GFR (glomerular filtration rate) arise.  Anticipate and prepare patient for laboratory studies to stage and/or monitor CKD and to identify and manage complications and comorbidities.  Address barriers to treatment  adherence, such as cognitive function, illness, drug interaction, medication cost, presence of depression or anxiety.  Facilitate coordination between nephrologist and primary care provider to ensure continuation of care for co-morbidities and routine preventive care.   Notes:   9/27 - Patient readmitted, discharged on 9/24    Problem: Malnutrition (Undernutrition)      Long-Range Goal: Malnutrition (Undernutrition) Prevented or Managed   Start Date: 09/12/2020  Expected End Date: 01/16/2021  This Visit's Progress: On track  Recent Progress: Not on track  Priority: High  Note:   Evidence-based guidance:  Perform, or refer for, a nutritional assessment, including a nutrition-focused physical exam to determine calorie, protein, vitamin, mineral and fluid requirements, and micronutrient deficiencies.  Evaluate need for diet modification, including weight loss if applicable, sodium and/or protein reduction; minimize unnecessary dietary restriction to increase oral intake.  Encourage adjustment of diet or meal schedule based on preferences and tolerance.  Encourage patient adherence to nutritional plan.  Consider implementation of bowel elimination program to increase comfort and appetite.  Encourage optimal oral hygiene to enhance desire  to eat.  Monitor and track weight over time; watch for unintended weight loss.  Correlate presence of depression or anxiety symptoms with ability or desire to follow nutritional guidance; provide or refer for mental health services.   Notes:       Goals Addressed             This Visit's Progress    THN - Make and Keep All Appointments   On track    Timeframe:  Short-Term Goal Priority:  Medium Start Date:     9/27                        Expected End Date:             12/27 (goal reset)  Barriers: Financial Knowledge     - ask family or friend for a ride - keep a calendar with appointment dates    Why is this important?   Part of  staying healthy is seeing the doctor for follow-up care.  If you forget your appointments, there are some things you can do to stay on track.    Notes:   6/27 - Conference calls placed to schedule follow up appointments. GI scheduled for 7/1, PCP for 7/12, and cardiology for 8/3.  She will drive self to these appointments  7/20 - Was readmitted to hospital 7/8-7/12.  Followed up with PCP yesterday, will go to cancer center on 7/22, and have appointment with cardiology on 8/3.  Either her friend or family will provide transportation as she is not currently driving.  0/86 - Follow up appointments reviewed with daughter.  Has [place] visit tomorrow with [facility] (first since discharge on 9/3), Hematology on 9/16, PCP within the next 2 weeks.    9/27 - Conference call placed to PCP, office visit scheduled for 10/6.  Call also placed to nephrology, notified that they will speak with provider and call daughter directly to schedule.  Call placed to [facility], they spoke to daughter [name], will make home visit tomorrow.  10/19 - Member was seen by PCP since last outreach call, will follow up in 4 weeks.  Daughter confirms appointment with nephrology has been scheduled, will look on her calendar to verify date/time.  Report Creatinine last week at PCP office was 2.6.  She has received her flu shot.     THN - Manage My Diet evidenced by increased appetite and healthy weight gain   On track    Timeframe:  Long-Range Goal Priority:  High Start Date:   6/27                        Expected End Date:           12/27            Barriers: Health Behaviors     - ask for help if I have trouble affording healthy foods - keep healthy snacks on hand - track weight in diary - weigh myself daily    Why is this important?   A healthy diet is important for mental and physical health.  Healthy food helps repair damaged body tissue and maintains strong bones and muscles.  No single food is just right so eating a  variety of proteins, fruits, vegetables and grains is best.  You may need to change what you eat or drink to manage kidney disease.  A dietitian is the best person to guide you.  Notes:   6/27 - Discussed recent weight loss (195 pounds in May, currently 145 pounds).  Has follow up with GI.  Report appetite is increasing, N/V is decreasing.  Referral to Care Guide for community resources, including food.  7/20 - Report she is now staying with a friend, which has helped her concern for food resources as well as ability to cook food during recovery.    9/12 - Member now living with son.  Family helping to manage health care.  Per daughter, appetite has increased since being on Mirtazepine.  She continues to be weak, family concerned about her being home alone during the day.  9/27 - Nepro ordered at recent discharge, daughter report family will not be able to afford if member is on for long period of time.  They will continue to encouraged increase po intake  10/19 - Daughter report member has had 12 pound weight gain from 132 to 144 pounds.  Appetite has increased, family monitoring daily weights in effort to manage fluid levels (Furosemide doses PRN based on daily weights)     THN - Prevent Falls and Injury   On track    Timeframe:  Long-Range Goal Priority:  High Start Date:     9/27                        Expected End Date:      12/27                 Barriers: Health Behaviors  Follow Up Date 12/30/2020    - keep my cell phone with me always - use a nonslip pad with throw rugs, or remove them completely - use a cane or walker    Why is this important?   Most falls happen when it is hard for you to walk safely. Your balance may be off because of an illness. You may have pain in your knees, hip or other joints.  You may be overly tired or taking medicines that make you sleepy. You may not be able to see or hear clearly.  Falls can lead to broken bones, bruises or other injuries.   There are things you can do to help prevent falling.     Notes:   9/27 - Per daughter, member is refusing to use walker in the home despite at least 3 falls in the last couple weeks requiring readmission.  She was not qualified for CIR or SNF as she was too high functioning, will have [facility] starting tomorrow.  10/19 - Daughter report member is getting better, including memory.  However she continues to refuse to use walker.  Active with both PT and OT.        Plan:  Follow-up: Patient agrees to Care Plan and Follow-up. Follow-up in 1 month(s).  Will mail forms for PCS and Aid and Attendance.  [name], [place], MSN [place] [phone]

## 2021-01-05 ENCOUNTER — Inpatient Hospital Stay: Payer: Medicare Other

## 2021-01-05 ENCOUNTER — Other Ambulatory Visit: Payer: Self-pay

## 2021-01-05 VITALS — BP 179/82 | HR 82 | Temp 98.9°F | Resp 15

## 2021-01-05 DIAGNOSIS — D509 Iron deficiency anemia, unspecified: Secondary | ICD-10-CM

## 2021-01-05 DIAGNOSIS — D649 Anemia, unspecified: Secondary | ICD-10-CM

## 2021-01-05 DIAGNOSIS — D464 Refractory anemia, unspecified: Secondary | ICD-10-CM

## 2021-01-05 DIAGNOSIS — N184 Chronic kidney disease, stage 4 (severe): Secondary | ICD-10-CM | POA: Diagnosis not present

## 2021-01-05 DIAGNOSIS — D631 Anemia in chronic kidney disease: Secondary | ICD-10-CM | POA: Diagnosis not present

## 2021-01-05 DIAGNOSIS — E538 Deficiency of other specified B group vitamins: Secondary | ICD-10-CM | POA: Diagnosis not present

## 2021-01-05 LAB — CBC WITH DIFFERENTIAL (CANCER CENTER ONLY)
Abs Immature Granulocytes: 0.01 10*3/uL (ref 0.00–0.07)
Basophils Absolute: 0 10*3/uL (ref 0.0–0.1)
Basophils Relative: 0 %
Eosinophils Absolute: 0.1 10*3/uL (ref 0.0–0.5)
Eosinophils Relative: 2 %
HCT: 31.5 % — ABNORMAL LOW (ref 36.0–46.0)
Hemoglobin: 9.6 g/dL — ABNORMAL LOW (ref 12.0–15.0)
Immature Granulocytes: 0 %
Lymphocytes Relative: 14 %
Lymphs Abs: 1 10*3/uL (ref 0.7–4.0)
MCH: 27.5 pg (ref 26.0–34.0)
MCHC: 30.5 g/dL (ref 30.0–36.0)
MCV: 90.3 fL (ref 80.0–100.0)
Monocytes Absolute: 0.5 10*3/uL (ref 0.1–1.0)
Monocytes Relative: 7 %
Neutro Abs: 5.2 10*3/uL (ref 1.7–7.7)
Neutrophils Relative %: 77 %
Platelet Count: 196 10*3/uL (ref 150–400)
RBC: 3.49 MIL/uL — ABNORMAL LOW (ref 3.87–5.11)
RDW: 17.9 % — ABNORMAL HIGH (ref 11.5–15.5)
WBC Count: 6.9 10*3/uL (ref 4.0–10.5)
nRBC: 0 % (ref 0.0–0.2)

## 2021-01-05 LAB — VITAMIN B12: Vitamin B-12: 783 pg/mL (ref 180–914)

## 2021-01-05 MED ORDER — EPOETIN ALFA-EPBX 40000 UNIT/ML IJ SOLN
40000.0000 [IU] | Freq: Once | INTRAMUSCULAR | Status: AC
  Administered 2021-01-05: 40000 [IU] via SUBCUTANEOUS
  Filled 2021-01-05: qty 1

## 2021-01-05 NOTE — Progress Notes (Signed)
Patients B/P was above levels for pharmacy to release injection contacted MD gave permision to give scheduled injection

## 2021-01-09 DIAGNOSIS — M48062 Spinal stenosis, lumbar region with neurogenic claudication: Secondary | ICD-10-CM | POA: Diagnosis not present

## 2021-01-09 DIAGNOSIS — I5032 Chronic diastolic (congestive) heart failure: Secondary | ICD-10-CM | POA: Diagnosis not present

## 2021-01-09 DIAGNOSIS — E538 Deficiency of other specified B group vitamins: Secondary | ICD-10-CM | POA: Diagnosis not present

## 2021-01-09 DIAGNOSIS — F32A Depression, unspecified: Secondary | ICD-10-CM | POA: Diagnosis not present

## 2021-01-09 DIAGNOSIS — L930 Discoid lupus erythematosus: Secondary | ICD-10-CM | POA: Diagnosis not present

## 2021-01-09 DIAGNOSIS — R634 Abnormal weight loss: Secondary | ICD-10-CM | POA: Diagnosis not present

## 2021-01-09 DIAGNOSIS — N2581 Secondary hyperparathyroidism of renal origin: Secondary | ICD-10-CM | POA: Diagnosis not present

## 2021-01-09 DIAGNOSIS — E1140 Type 2 diabetes mellitus with diabetic neuropathy, unspecified: Secondary | ICD-10-CM | POA: Diagnosis not present

## 2021-01-09 DIAGNOSIS — G8929 Other chronic pain: Secondary | ICD-10-CM | POA: Diagnosis not present

## 2021-01-09 DIAGNOSIS — I4510 Unspecified right bundle-branch block: Secondary | ICD-10-CM | POA: Diagnosis not present

## 2021-01-09 DIAGNOSIS — E440 Moderate protein-calorie malnutrition: Secondary | ICD-10-CM | POA: Diagnosis not present

## 2021-01-09 DIAGNOSIS — N184 Chronic kidney disease, stage 4 (severe): Secondary | ICD-10-CM | POA: Diagnosis not present

## 2021-01-09 DIAGNOSIS — E1151 Type 2 diabetes mellitus with diabetic peripheral angiopathy without gangrene: Secondary | ICD-10-CM | POA: Diagnosis not present

## 2021-01-09 DIAGNOSIS — M109 Gout, unspecified: Secondary | ICD-10-CM | POA: Diagnosis not present

## 2021-01-09 DIAGNOSIS — E1122 Type 2 diabetes mellitus with diabetic chronic kidney disease: Secondary | ICD-10-CM | POA: Diagnosis not present

## 2021-01-09 DIAGNOSIS — E1169 Type 2 diabetes mellitus with other specified complication: Secondary | ICD-10-CM | POA: Diagnosis not present

## 2021-01-09 DIAGNOSIS — D631 Anemia in chronic kidney disease: Secondary | ICD-10-CM | POA: Diagnosis not present

## 2021-01-09 DIAGNOSIS — I872 Venous insufficiency (chronic) (peripheral): Secondary | ICD-10-CM | POA: Diagnosis not present

## 2021-01-09 DIAGNOSIS — Z7901 Long term (current) use of anticoagulants: Secondary | ICD-10-CM | POA: Diagnosis not present

## 2021-01-09 DIAGNOSIS — E785 Hyperlipidemia, unspecified: Secondary | ICD-10-CM | POA: Diagnosis not present

## 2021-01-09 DIAGNOSIS — I2510 Atherosclerotic heart disease of native coronary artery without angina pectoris: Secondary | ICD-10-CM | POA: Diagnosis not present

## 2021-01-09 DIAGNOSIS — I130 Hypertensive heart and chronic kidney disease with heart failure and stage 1 through stage 4 chronic kidney disease, or unspecified chronic kidney disease: Secondary | ICD-10-CM | POA: Diagnosis not present

## 2021-01-11 DIAGNOSIS — D631 Anemia in chronic kidney disease: Secondary | ICD-10-CM | POA: Diagnosis not present

## 2021-01-11 DIAGNOSIS — E785 Hyperlipidemia, unspecified: Secondary | ICD-10-CM | POA: Diagnosis not present

## 2021-01-11 DIAGNOSIS — N184 Chronic kidney disease, stage 4 (severe): Secondary | ICD-10-CM | POA: Diagnosis not present

## 2021-01-11 DIAGNOSIS — M109 Gout, unspecified: Secondary | ICD-10-CM | POA: Diagnosis not present

## 2021-01-11 DIAGNOSIS — E1151 Type 2 diabetes mellitus with diabetic peripheral angiopathy without gangrene: Secondary | ICD-10-CM | POA: Diagnosis not present

## 2021-01-11 DIAGNOSIS — E1140 Type 2 diabetes mellitus with diabetic neuropathy, unspecified: Secondary | ICD-10-CM | POA: Diagnosis not present

## 2021-01-11 DIAGNOSIS — E1169 Type 2 diabetes mellitus with other specified complication: Secondary | ICD-10-CM | POA: Diagnosis not present

## 2021-01-11 DIAGNOSIS — N2581 Secondary hyperparathyroidism of renal origin: Secondary | ICD-10-CM | POA: Diagnosis not present

## 2021-01-11 DIAGNOSIS — I4510 Unspecified right bundle-branch block: Secondary | ICD-10-CM | POA: Diagnosis not present

## 2021-01-11 DIAGNOSIS — I872 Venous insufficiency (chronic) (peripheral): Secondary | ICD-10-CM | POA: Diagnosis not present

## 2021-01-11 DIAGNOSIS — I2510 Atherosclerotic heart disease of native coronary artery without angina pectoris: Secondary | ICD-10-CM | POA: Diagnosis not present

## 2021-01-11 DIAGNOSIS — M48062 Spinal stenosis, lumbar region with neurogenic claudication: Secondary | ICD-10-CM | POA: Diagnosis not present

## 2021-01-11 DIAGNOSIS — E440 Moderate protein-calorie malnutrition: Secondary | ICD-10-CM | POA: Diagnosis not present

## 2021-01-11 DIAGNOSIS — R634 Abnormal weight loss: Secondary | ICD-10-CM | POA: Diagnosis not present

## 2021-01-11 DIAGNOSIS — L930 Discoid lupus erythematosus: Secondary | ICD-10-CM | POA: Diagnosis not present

## 2021-01-11 DIAGNOSIS — E1122 Type 2 diabetes mellitus with diabetic chronic kidney disease: Secondary | ICD-10-CM | POA: Diagnosis not present

## 2021-01-11 DIAGNOSIS — I130 Hypertensive heart and chronic kidney disease with heart failure and stage 1 through stage 4 chronic kidney disease, or unspecified chronic kidney disease: Secondary | ICD-10-CM | POA: Diagnosis not present

## 2021-01-11 DIAGNOSIS — I5032 Chronic diastolic (congestive) heart failure: Secondary | ICD-10-CM | POA: Diagnosis not present

## 2021-01-11 DIAGNOSIS — Z7901 Long term (current) use of anticoagulants: Secondary | ICD-10-CM | POA: Diagnosis not present

## 2021-01-11 DIAGNOSIS — E538 Deficiency of other specified B group vitamins: Secondary | ICD-10-CM | POA: Diagnosis not present

## 2021-01-11 DIAGNOSIS — G8929 Other chronic pain: Secondary | ICD-10-CM | POA: Diagnosis not present

## 2021-01-11 DIAGNOSIS — F32A Depression, unspecified: Secondary | ICD-10-CM | POA: Diagnosis not present

## 2021-01-12 DIAGNOSIS — R350 Frequency of micturition: Secondary | ICD-10-CM | POA: Diagnosis not present

## 2021-01-12 DIAGNOSIS — I120 Hypertensive chronic kidney disease with stage 5 chronic kidney disease or end stage renal disease: Secondary | ICD-10-CM | POA: Diagnosis not present

## 2021-01-12 DIAGNOSIS — N184 Chronic kidney disease, stage 4 (severe): Secondary | ICD-10-CM | POA: Diagnosis not present

## 2021-01-12 DIAGNOSIS — C50312 Malignant neoplasm of lower-inner quadrant of left female breast: Secondary | ICD-10-CM | POA: Diagnosis not present

## 2021-01-12 DIAGNOSIS — D631 Anemia in chronic kidney disease: Secondary | ICD-10-CM | POA: Diagnosis not present

## 2021-01-12 DIAGNOSIS — E1122 Type 2 diabetes mellitus with diabetic chronic kidney disease: Secondary | ICD-10-CM | POA: Diagnosis not present

## 2021-01-12 DIAGNOSIS — N2581 Secondary hyperparathyroidism of renal origin: Secondary | ICD-10-CM | POA: Diagnosis not present

## 2021-01-12 DIAGNOSIS — I82451 Acute embolism and thrombosis of right peroneal vein: Secondary | ICD-10-CM | POA: Diagnosis not present

## 2021-01-12 DIAGNOSIS — I5032 Chronic diastolic (congestive) heart failure: Secondary | ICD-10-CM | POA: Diagnosis not present

## 2021-01-18 DIAGNOSIS — G8929 Other chronic pain: Secondary | ICD-10-CM | POA: Diagnosis not present

## 2021-01-18 DIAGNOSIS — N184 Chronic kidney disease, stage 4 (severe): Secondary | ICD-10-CM | POA: Diagnosis not present

## 2021-01-18 DIAGNOSIS — E1169 Type 2 diabetes mellitus with other specified complication: Secondary | ICD-10-CM | POA: Diagnosis not present

## 2021-01-18 DIAGNOSIS — I4510 Unspecified right bundle-branch block: Secondary | ICD-10-CM | POA: Diagnosis not present

## 2021-01-18 DIAGNOSIS — E440 Moderate protein-calorie malnutrition: Secondary | ICD-10-CM | POA: Diagnosis not present

## 2021-01-18 DIAGNOSIS — E1140 Type 2 diabetes mellitus with diabetic neuropathy, unspecified: Secondary | ICD-10-CM | POA: Diagnosis not present

## 2021-01-18 DIAGNOSIS — N2581 Secondary hyperparathyroidism of renal origin: Secondary | ICD-10-CM | POA: Diagnosis not present

## 2021-01-18 DIAGNOSIS — E785 Hyperlipidemia, unspecified: Secondary | ICD-10-CM | POA: Diagnosis not present

## 2021-01-18 DIAGNOSIS — I5032 Chronic diastolic (congestive) heart failure: Secondary | ICD-10-CM | POA: Diagnosis not present

## 2021-01-18 DIAGNOSIS — F32A Depression, unspecified: Secondary | ICD-10-CM | POA: Diagnosis not present

## 2021-01-18 DIAGNOSIS — M48062 Spinal stenosis, lumbar region with neurogenic claudication: Secondary | ICD-10-CM | POA: Diagnosis not present

## 2021-01-18 DIAGNOSIS — D631 Anemia in chronic kidney disease: Secondary | ICD-10-CM | POA: Diagnosis not present

## 2021-01-18 DIAGNOSIS — M109 Gout, unspecified: Secondary | ICD-10-CM | POA: Diagnosis not present

## 2021-01-18 DIAGNOSIS — L930 Discoid lupus erythematosus: Secondary | ICD-10-CM | POA: Diagnosis not present

## 2021-01-18 DIAGNOSIS — I130 Hypertensive heart and chronic kidney disease with heart failure and stage 1 through stage 4 chronic kidney disease, or unspecified chronic kidney disease: Secondary | ICD-10-CM | POA: Diagnosis not present

## 2021-01-18 DIAGNOSIS — E1151 Type 2 diabetes mellitus with diabetic peripheral angiopathy without gangrene: Secondary | ICD-10-CM | POA: Diagnosis not present

## 2021-01-18 DIAGNOSIS — Z7901 Long term (current) use of anticoagulants: Secondary | ICD-10-CM | POA: Diagnosis not present

## 2021-01-18 DIAGNOSIS — E538 Deficiency of other specified B group vitamins: Secondary | ICD-10-CM | POA: Diagnosis not present

## 2021-01-18 DIAGNOSIS — E1122 Type 2 diabetes mellitus with diabetic chronic kidney disease: Secondary | ICD-10-CM | POA: Diagnosis not present

## 2021-01-18 DIAGNOSIS — R634 Abnormal weight loss: Secondary | ICD-10-CM | POA: Diagnosis not present

## 2021-01-18 DIAGNOSIS — I2510 Atherosclerotic heart disease of native coronary artery without angina pectoris: Secondary | ICD-10-CM | POA: Diagnosis not present

## 2021-01-18 DIAGNOSIS — I872 Venous insufficiency (chronic) (peripheral): Secondary | ICD-10-CM | POA: Diagnosis not present

## 2021-01-19 ENCOUNTER — Inpatient Hospital Stay: Payer: Medicare Other

## 2021-01-19 ENCOUNTER — Other Ambulatory Visit: Payer: Self-pay

## 2021-01-19 ENCOUNTER — Inpatient Hospital Stay: Payer: Medicare Other | Attending: Hematology

## 2021-01-19 VITALS — BP 158/58 | HR 88 | Temp 98.7°F | Resp 16

## 2021-01-19 DIAGNOSIS — E538 Deficiency of other specified B group vitamins: Secondary | ICD-10-CM | POA: Diagnosis not present

## 2021-01-19 DIAGNOSIS — D631 Anemia in chronic kidney disease: Secondary | ICD-10-CM | POA: Diagnosis not present

## 2021-01-19 DIAGNOSIS — N184 Chronic kidney disease, stage 4 (severe): Secondary | ICD-10-CM | POA: Insufficient documentation

## 2021-01-19 DIAGNOSIS — D464 Refractory anemia, unspecified: Secondary | ICD-10-CM

## 2021-01-19 DIAGNOSIS — D509 Iron deficiency anemia, unspecified: Secondary | ICD-10-CM

## 2021-01-19 LAB — CBC WITH DIFFERENTIAL (CANCER CENTER ONLY)
Abs Immature Granulocytes: 0.04 10*3/uL (ref 0.00–0.07)
Basophils Absolute: 0 10*3/uL (ref 0.0–0.1)
Basophils Relative: 1 %
Eosinophils Absolute: 0.1 10*3/uL (ref 0.0–0.5)
Eosinophils Relative: 1 %
HCT: 35.9 % — ABNORMAL LOW (ref 36.0–46.0)
Hemoglobin: 11.1 g/dL — ABNORMAL LOW (ref 12.0–15.0)
Immature Granulocytes: 1 %
Lymphocytes Relative: 19 %
Lymphs Abs: 1.5 10*3/uL (ref 0.7–4.0)
MCH: 27.5 pg (ref 26.0–34.0)
MCHC: 30.9 g/dL (ref 30.0–36.0)
MCV: 88.9 fL (ref 80.0–100.0)
Monocytes Absolute: 0.6 10*3/uL (ref 0.1–1.0)
Monocytes Relative: 9 %
Neutro Abs: 5.3 10*3/uL (ref 1.7–7.7)
Neutrophils Relative %: 69 %
Platelet Count: 285 10*3/uL (ref 150–400)
RBC: 4.04 MIL/uL (ref 3.87–5.11)
RDW: 16.5 % — ABNORMAL HIGH (ref 11.5–15.5)
WBC Count: 7.5 10*3/uL (ref 4.0–10.5)
nRBC: 0 % (ref 0.0–0.2)

## 2021-01-19 MED ORDER — EPOETIN ALFA-EPBX 40000 UNIT/ML IJ SOLN: 40000.0000 [IU] | Freq: Once | INTRAMUSCULAR | Status: DC

## 2021-01-19 NOTE — Progress Notes (Signed)
Patient HGB was 11.1 which is outside of injection parameters at 10.9 withheld injection

## 2021-01-20 DIAGNOSIS — I10 Essential (primary) hypertension: Secondary | ICD-10-CM | POA: Diagnosis not present

## 2021-01-20 DIAGNOSIS — E7800 Pure hypercholesterolemia, unspecified: Secondary | ICD-10-CM | POA: Diagnosis not present

## 2021-01-20 DIAGNOSIS — E1122 Type 2 diabetes mellitus with diabetic chronic kidney disease: Secondary | ICD-10-CM | POA: Diagnosis not present

## 2021-01-20 DIAGNOSIS — E1169 Type 2 diabetes mellitus with other specified complication: Secondary | ICD-10-CM | POA: Diagnosis not present

## 2021-01-20 DIAGNOSIS — D631 Anemia in chronic kidney disease: Secondary | ICD-10-CM | POA: Diagnosis not present

## 2021-01-20 DIAGNOSIS — D509 Iron deficiency anemia, unspecified: Secondary | ICD-10-CM | POA: Diagnosis not present

## 2021-01-20 DIAGNOSIS — N184 Chronic kidney disease, stage 4 (severe): Secondary | ICD-10-CM | POA: Diagnosis not present

## 2021-01-23 DIAGNOSIS — R634 Abnormal weight loss: Secondary | ICD-10-CM | POA: Diagnosis not present

## 2021-01-23 DIAGNOSIS — G8929 Other chronic pain: Secondary | ICD-10-CM | POA: Diagnosis not present

## 2021-01-23 DIAGNOSIS — N184 Chronic kidney disease, stage 4 (severe): Secondary | ICD-10-CM | POA: Diagnosis not present

## 2021-01-23 DIAGNOSIS — E1122 Type 2 diabetes mellitus with diabetic chronic kidney disease: Secondary | ICD-10-CM | POA: Diagnosis not present

## 2021-01-23 DIAGNOSIS — D631 Anemia in chronic kidney disease: Secondary | ICD-10-CM | POA: Diagnosis not present

## 2021-01-23 DIAGNOSIS — E785 Hyperlipidemia, unspecified: Secondary | ICD-10-CM | POA: Diagnosis not present

## 2021-01-23 DIAGNOSIS — E538 Deficiency of other specified B group vitamins: Secondary | ICD-10-CM | POA: Diagnosis not present

## 2021-01-23 DIAGNOSIS — I2510 Atherosclerotic heart disease of native coronary artery without angina pectoris: Secondary | ICD-10-CM | POA: Diagnosis not present

## 2021-01-23 DIAGNOSIS — E440 Moderate protein-calorie malnutrition: Secondary | ICD-10-CM | POA: Diagnosis not present

## 2021-01-23 DIAGNOSIS — M48062 Spinal stenosis, lumbar region with neurogenic claudication: Secondary | ICD-10-CM | POA: Diagnosis not present

## 2021-01-23 DIAGNOSIS — I872 Venous insufficiency (chronic) (peripheral): Secondary | ICD-10-CM | POA: Diagnosis not present

## 2021-01-23 DIAGNOSIS — F32A Depression, unspecified: Secondary | ICD-10-CM | POA: Diagnosis not present

## 2021-01-23 DIAGNOSIS — I130 Hypertensive heart and chronic kidney disease with heart failure and stage 1 through stage 4 chronic kidney disease, or unspecified chronic kidney disease: Secondary | ICD-10-CM | POA: Diagnosis not present

## 2021-01-23 DIAGNOSIS — M109 Gout, unspecified: Secondary | ICD-10-CM | POA: Diagnosis not present

## 2021-01-23 DIAGNOSIS — Z7901 Long term (current) use of anticoagulants: Secondary | ICD-10-CM | POA: Diagnosis not present

## 2021-01-23 DIAGNOSIS — E1169 Type 2 diabetes mellitus with other specified complication: Secondary | ICD-10-CM | POA: Diagnosis not present

## 2021-01-23 DIAGNOSIS — I5032 Chronic diastolic (congestive) heart failure: Secondary | ICD-10-CM | POA: Diagnosis not present

## 2021-01-23 DIAGNOSIS — N2581 Secondary hyperparathyroidism of renal origin: Secondary | ICD-10-CM | POA: Diagnosis not present

## 2021-01-23 DIAGNOSIS — E1140 Type 2 diabetes mellitus with diabetic neuropathy, unspecified: Secondary | ICD-10-CM | POA: Diagnosis not present

## 2021-01-23 DIAGNOSIS — E1151 Type 2 diabetes mellitus with diabetic peripheral angiopathy without gangrene: Secondary | ICD-10-CM | POA: Diagnosis not present

## 2021-01-23 DIAGNOSIS — I4510 Unspecified right bundle-branch block: Secondary | ICD-10-CM | POA: Diagnosis not present

## 2021-01-23 DIAGNOSIS — L930 Discoid lupus erythematosus: Secondary | ICD-10-CM | POA: Diagnosis not present

## 2021-01-24 DIAGNOSIS — E785 Hyperlipidemia, unspecified: Secondary | ICD-10-CM | POA: Diagnosis not present

## 2021-01-24 DIAGNOSIS — L930 Discoid lupus erythematosus: Secondary | ICD-10-CM | POA: Diagnosis not present

## 2021-01-24 DIAGNOSIS — E538 Deficiency of other specified B group vitamins: Secondary | ICD-10-CM | POA: Diagnosis not present

## 2021-01-24 DIAGNOSIS — E440 Moderate protein-calorie malnutrition: Secondary | ICD-10-CM | POA: Diagnosis not present

## 2021-01-24 DIAGNOSIS — N184 Chronic kidney disease, stage 4 (severe): Secondary | ICD-10-CM | POA: Diagnosis not present

## 2021-01-24 DIAGNOSIS — E1151 Type 2 diabetes mellitus with diabetic peripheral angiopathy without gangrene: Secondary | ICD-10-CM | POA: Diagnosis not present

## 2021-01-24 DIAGNOSIS — I130 Hypertensive heart and chronic kidney disease with heart failure and stage 1 through stage 4 chronic kidney disease, or unspecified chronic kidney disease: Secondary | ICD-10-CM | POA: Diagnosis not present

## 2021-01-24 DIAGNOSIS — D631 Anemia in chronic kidney disease: Secondary | ICD-10-CM | POA: Diagnosis not present

## 2021-01-24 DIAGNOSIS — E1122 Type 2 diabetes mellitus with diabetic chronic kidney disease: Secondary | ICD-10-CM | POA: Diagnosis not present

## 2021-01-24 DIAGNOSIS — M48062 Spinal stenosis, lumbar region with neurogenic claudication: Secondary | ICD-10-CM | POA: Diagnosis not present

## 2021-01-24 DIAGNOSIS — E1140 Type 2 diabetes mellitus with diabetic neuropathy, unspecified: Secondary | ICD-10-CM | POA: Diagnosis not present

## 2021-01-24 DIAGNOSIS — G8929 Other chronic pain: Secondary | ICD-10-CM | POA: Diagnosis not present

## 2021-01-24 DIAGNOSIS — I5032 Chronic diastolic (congestive) heart failure: Secondary | ICD-10-CM | POA: Diagnosis not present

## 2021-01-24 DIAGNOSIS — R634 Abnormal weight loss: Secondary | ICD-10-CM | POA: Diagnosis not present

## 2021-01-24 DIAGNOSIS — E1169 Type 2 diabetes mellitus with other specified complication: Secondary | ICD-10-CM | POA: Diagnosis not present

## 2021-01-24 DIAGNOSIS — I2510 Atherosclerotic heart disease of native coronary artery without angina pectoris: Secondary | ICD-10-CM | POA: Diagnosis not present

## 2021-01-24 DIAGNOSIS — I4510 Unspecified right bundle-branch block: Secondary | ICD-10-CM | POA: Diagnosis not present

## 2021-01-24 DIAGNOSIS — F32A Depression, unspecified: Secondary | ICD-10-CM | POA: Diagnosis not present

## 2021-01-24 DIAGNOSIS — I872 Venous insufficiency (chronic) (peripheral): Secondary | ICD-10-CM | POA: Diagnosis not present

## 2021-01-24 DIAGNOSIS — Z7901 Long term (current) use of anticoagulants: Secondary | ICD-10-CM | POA: Diagnosis not present

## 2021-01-24 DIAGNOSIS — M109 Gout, unspecified: Secondary | ICD-10-CM | POA: Diagnosis not present

## 2021-01-24 DIAGNOSIS — N2581 Secondary hyperparathyroidism of renal origin: Secondary | ICD-10-CM | POA: Diagnosis not present

## 2021-01-27 DIAGNOSIS — E1140 Type 2 diabetes mellitus with diabetic neuropathy, unspecified: Secondary | ICD-10-CM | POA: Diagnosis not present

## 2021-01-27 DIAGNOSIS — E1151 Type 2 diabetes mellitus with diabetic peripheral angiopathy without gangrene: Secondary | ICD-10-CM | POA: Diagnosis not present

## 2021-01-27 DIAGNOSIS — G8929 Other chronic pain: Secondary | ICD-10-CM | POA: Diagnosis not present

## 2021-01-27 DIAGNOSIS — E785 Hyperlipidemia, unspecified: Secondary | ICD-10-CM | POA: Diagnosis not present

## 2021-01-27 DIAGNOSIS — E1169 Type 2 diabetes mellitus with other specified complication: Secondary | ICD-10-CM | POA: Diagnosis not present

## 2021-01-27 DIAGNOSIS — E440 Moderate protein-calorie malnutrition: Secondary | ICD-10-CM | POA: Diagnosis not present

## 2021-01-27 DIAGNOSIS — D631 Anemia in chronic kidney disease: Secondary | ICD-10-CM | POA: Diagnosis not present

## 2021-01-27 DIAGNOSIS — N184 Chronic kidney disease, stage 4 (severe): Secondary | ICD-10-CM | POA: Diagnosis not present

## 2021-01-27 DIAGNOSIS — E538 Deficiency of other specified B group vitamins: Secondary | ICD-10-CM | POA: Diagnosis not present

## 2021-01-27 DIAGNOSIS — M109 Gout, unspecified: Secondary | ICD-10-CM | POA: Diagnosis not present

## 2021-01-27 DIAGNOSIS — I4510 Unspecified right bundle-branch block: Secondary | ICD-10-CM | POA: Diagnosis not present

## 2021-01-27 DIAGNOSIS — I2510 Atherosclerotic heart disease of native coronary artery without angina pectoris: Secondary | ICD-10-CM | POA: Diagnosis not present

## 2021-01-27 DIAGNOSIS — I5032 Chronic diastolic (congestive) heart failure: Secondary | ICD-10-CM | POA: Diagnosis not present

## 2021-01-27 DIAGNOSIS — R634 Abnormal weight loss: Secondary | ICD-10-CM | POA: Diagnosis not present

## 2021-01-27 DIAGNOSIS — L930 Discoid lupus erythematosus: Secondary | ICD-10-CM | POA: Diagnosis not present

## 2021-01-27 DIAGNOSIS — Z7901 Long term (current) use of anticoagulants: Secondary | ICD-10-CM | POA: Diagnosis not present

## 2021-01-27 DIAGNOSIS — M48062 Spinal stenosis, lumbar region with neurogenic claudication: Secondary | ICD-10-CM | POA: Diagnosis not present

## 2021-01-27 DIAGNOSIS — E1122 Type 2 diabetes mellitus with diabetic chronic kidney disease: Secondary | ICD-10-CM | POA: Diagnosis not present

## 2021-01-27 DIAGNOSIS — I130 Hypertensive heart and chronic kidney disease with heart failure and stage 1 through stage 4 chronic kidney disease, or unspecified chronic kidney disease: Secondary | ICD-10-CM | POA: Diagnosis not present

## 2021-01-27 DIAGNOSIS — F32A Depression, unspecified: Secondary | ICD-10-CM | POA: Diagnosis not present

## 2021-01-27 DIAGNOSIS — N2581 Secondary hyperparathyroidism of renal origin: Secondary | ICD-10-CM | POA: Diagnosis not present

## 2021-01-27 DIAGNOSIS — I872 Venous insufficiency (chronic) (peripheral): Secondary | ICD-10-CM | POA: Diagnosis not present

## 2021-02-01 ENCOUNTER — Other Ambulatory Visit: Payer: Self-pay | Admitting: *Deleted

## 2021-02-01 NOTE — Patient Outreach (Signed)
[facility]) Care Management  02/01/2021  [name] 27-Nov-1945 [id number]   Outgoing call placed to member's daughter [name], no answer, HIPAA compliant voice message left. Will follow up within the next 3-4 business days.  [name], [place], MSN [place] [phone]

## 2021-02-02 ENCOUNTER — Inpatient Hospital Stay: Payer: Medicare Other

## 2021-02-02 ENCOUNTER — Other Ambulatory Visit: Payer: Self-pay

## 2021-02-02 VITALS — BP 187/73 | HR 90 | Temp 98.8°F | Resp 16

## 2021-02-02 DIAGNOSIS — N184 Chronic kidney disease, stage 4 (severe): Secondary | ICD-10-CM | POA: Diagnosis not present

## 2021-02-02 DIAGNOSIS — D464 Refractory anemia, unspecified: Secondary | ICD-10-CM

## 2021-02-02 DIAGNOSIS — D509 Iron deficiency anemia, unspecified: Secondary | ICD-10-CM

## 2021-02-02 DIAGNOSIS — D631 Anemia in chronic kidney disease: Secondary | ICD-10-CM | POA: Diagnosis not present

## 2021-02-02 DIAGNOSIS — E538 Deficiency of other specified B group vitamins: Secondary | ICD-10-CM | POA: Diagnosis not present

## 2021-02-02 LAB — CBC WITH DIFFERENTIAL (CANCER CENTER ONLY)
Abs Immature Granulocytes: 0.02 10*3/uL (ref 0.00–0.07)
Basophils Absolute: 0 10*3/uL (ref 0.0–0.1)
Basophils Relative: 0 %
Eosinophils Absolute: 0.1 10*3/uL (ref 0.0–0.5)
Eosinophils Relative: 1 %
HCT: 32.8 % — ABNORMAL LOW (ref 36.0–46.0)
Hemoglobin: 10.3 g/dL — ABNORMAL LOW (ref 12.0–15.0)
Immature Granulocytes: 0 %
Lymphocytes Relative: 15 %
Lymphs Abs: 1.4 10*3/uL (ref 0.7–4.0)
MCH: 27.2 pg (ref 26.0–34.0)
MCHC: 31.4 g/dL (ref 30.0–36.0)
MCV: 86.5 fL (ref 80.0–100.0)
Monocytes Absolute: 0.6 10*3/uL (ref 0.1–1.0)
Monocytes Relative: 6 %
Neutro Abs: 7.3 10*3/uL (ref 1.7–7.7)
Neutrophils Relative %: 78 %
Platelet Count: 205 10*3/uL (ref 150–400)
RBC: 3.79 MIL/uL — ABNORMAL LOW (ref 3.87–5.11)
RDW: 14.8 % (ref 11.5–15.5)
WBC Count: 9.3 10*3/uL (ref 4.0–10.5)
nRBC: 0 % (ref 0.0–0.2)

## 2021-02-02 MED ORDER — EPOETIN ALFA-EPBX 40000 UNIT/ML IJ SOLN
40000.0000 [IU] | Freq: Once | INTRAMUSCULAR | Status: AC
  Administered 2021-02-02: 16:00:00 40000 [IU] via SUBCUTANEOUS
  Filled 2021-02-02: qty 1

## 2021-02-02 MED ORDER — CYANOCOBALAMIN 1000 MCG/ML IJ SOLN
1000.0000 ug | Freq: Once | INTRAMUSCULAR | Status: AC
  Administered 2021-02-02: 16:00:00 1000 ug via INTRAMUSCULAR
  Filled 2021-02-02: qty 1

## 2021-02-06 ENCOUNTER — Other Ambulatory Visit: Payer: Self-pay | Admitting: *Deleted

## 2021-02-06 NOTE — Patient Outreach (Signed)
[facility]) Care Management  02/06/2021  [name] 04-18-1945 [id number]   Outreach attempt #2, unsuccessful to daughter [name], HIPAA compliant voice message left.  Will send outreach letter and follow up within the next 3-4 business days.  [name], [place], MSN [place] [phone]

## 2021-02-13 ENCOUNTER — Other Ambulatory Visit: Payer: Self-pay | Admitting: *Deleted

## 2021-02-13 DIAGNOSIS — I82551 Chronic embolism and thrombosis of right peroneal vein: Secondary | ICD-10-CM | POA: Diagnosis not present

## 2021-02-13 DIAGNOSIS — D631 Anemia in chronic kidney disease: Secondary | ICD-10-CM | POA: Diagnosis not present

## 2021-02-13 DIAGNOSIS — E1169 Type 2 diabetes mellitus with other specified complication: Secondary | ICD-10-CM | POA: Diagnosis not present

## 2021-02-13 DIAGNOSIS — I5189 Other ill-defined heart diseases: Secondary | ICD-10-CM | POA: Diagnosis not present

## 2021-02-13 DIAGNOSIS — N184 Chronic kidney disease, stage 4 (severe): Secondary | ICD-10-CM | POA: Diagnosis not present

## 2021-02-13 NOTE — Patient Outreach (Signed)
[name]) Care Management  02/13/2021  [name] [birth year] [id number]   Outreach attempt #3, successful to daughter.  State member is doing well, was restarted on Hydralazine due to increased blood pressure, now more stable.  Member still living with son, family would like to have member more involved in activities, encouraged to look into activities at the area senior center.  Denies any urgent concerns, encouraged to contact this care manager with questions.  Advised that member will be followed by chronic care management team at PCP office, daughter verbalizes understanding. Will close case at this time, member will be active with external case management program.   Goals Addressed             This Visit's Progress    COMPLETED: THN - Make and Keep All Appointments   On track    Timeframe:  Short-Term Goal Priority:  Medium Start Date:     9/27                        Expected End Date:             12/27 (goal reset)  Barriers: Financial Knowledge     - ask family or friend for a ride - keep a calendar with appointment dates    Why is this important?   Part of staying healthy is seeing the doctor for follow-up care.  If you forget your appointments, there are some things you can do to stay on track.    Notes:   6/27 - Conference calls placed to schedule follow up appointments. GI scheduled for 7/1, PCP for 7/12, and cardiology for 8/3.  She will drive self to these appointments  7/20 - Was readmitted to [name] 7/8-7/12.  Followed up with PCP yesterday, will go to cancer center on 7/22, and have appointment with cardiology on 8/3.  Either her friend or family will provide transportation as she is not currently driving.  5/42 - Follow up appointments reviewed with daughter.  Has [place] visit tomorrow with [facility] (first since discharge on 9/3), Hematology on 9/16, PCP within the next 2 weeks.    9/27 - Conference call placed to PCP, office  visit scheduled for 10/6.  Call also placed to nephrology, notified that they will speak with provider and call daughter directly to schedule.  Call placed to [name], they spoke to daughter [name], will make home visit tomorrow.  10/19 - Member was seen by PCP since last outreach call, will follow up in 4 weeks.  Daughter confirms appointment with nephrology has been scheduled, will look on her calendar to verify date/time.  Report Creatinine last week at PCP office was 2.6.  She has received her flu shot.  11/28 - Follow up with PCP completed, no urgent concerns     COMPLETED: THN - Manage My Diet evidenced by increased appetite and healthy weight gain   On track    Timeframe:  Long-Range Goal Priority:  High Start Date:   6/27                        Expected End Date:           12/27            Barriers: Health Behaviors     - ask for help if I have trouble affording healthy foods - keep healthy snacks on hand - track weight in diary -  weigh myself daily    Why is this important?   A healthy diet is important for mental and physical health.  Healthy food helps repair damaged body tissue and maintains strong bones and muscles.  No single food is just right so eating a variety of proteins, fruits, vegetables and grains is best.  You may need to change what you eat or drink to manage kidney disease.  A dietitian is the best person to guide you.     Notes:   6/27 - Discussed recent weight loss (195 pounds in May, currently 145 pounds).  Has follow up with GI.  Report appetite is increasing, N/V is decreasing.  Referral to Care Guide for community resources, including food.  7/20 - Report she is now staying with a friend, which has helped her concern for food resources as well as ability to cook food during recovery.    9/12 - Member now living with son.  Family helping to manage health care.  Per daughter, appetite has increased since being on Mirtazepine.  She continues to be  weak, family concerned about her being home alone during the day.  9/27 - Nepro ordered at recent discharge, daughter report family will not be able to afford if member is on for long period of time.  They will continue to encouraged increase po intake  10/19 - Daughter report member has had 12 pound weight gain from 132 to 144 pounds.  Appetite has increased, family monitoring daily weights in effort to manage fluid levels (Furosemide doses PRN based on daily weights)  11/28 - Per daughter, has continued a healthy low sodium diet, weight stable.     COMPLETED: THN - Prevent Falls and Injury   On track    Timeframe:  Long-Range Goal Priority:  High Start Date:     9/27                        Expected End Date:      12/27                 Barriers: Health Behaviors  Follow Up Date 12/30/2020    - keep my cell phone with me always - use a nonslip pad with throw rugs, or remove them completely - use a cane or walker    Why is this important?   Most falls happen when it is hard for you to walk safely. Your balance may be off because of an illness. You may have pain in your knees, hip or other joints.  You may be overly tired or taking medicines that make you sleepy. You may not be able to see or hear clearly.  Falls can lead to broken bones, bruises or other injuries.  There are things you can do to help prevent falling.     Notes:   9/27 - Per daughter, member is refusing to use walker in the home despite at least 3 falls in the last couple weeks requiring readmission.  She was not qualified for CIR or SNF as she was too high functioning, will have [facility] starting tomorrow.  10/19 - Daughter report member is getting better, including memory.  However she continues to refuse to use walker.  Active with both PT and OT.  11/28 - Completed sessions with PT/OT, discharged from home health.       [name], [place], MSN [place] [phone]

## 2021-02-14 DIAGNOSIS — I951 Orthostatic hypotension: Secondary | ICD-10-CM | POA: Diagnosis not present

## 2021-02-14 DIAGNOSIS — E440 Moderate protein-calorie malnutrition: Secondary | ICD-10-CM | POA: Diagnosis not present

## 2021-02-14 DIAGNOSIS — M109 Gout, unspecified: Secondary | ICD-10-CM | POA: Diagnosis not present

## 2021-02-14 DIAGNOSIS — E1140 Type 2 diabetes mellitus with diabetic neuropathy, unspecified: Secondary | ICD-10-CM | POA: Diagnosis not present

## 2021-02-14 DIAGNOSIS — E559 Vitamin D deficiency, unspecified: Secondary | ICD-10-CM | POA: Diagnosis not present

## 2021-02-14 DIAGNOSIS — E538 Deficiency of other specified B group vitamins: Secondary | ICD-10-CM | POA: Diagnosis not present

## 2021-02-14 DIAGNOSIS — M47816 Spondylosis without myelopathy or radiculopathy, lumbar region: Secondary | ICD-10-CM | POA: Diagnosis not present

## 2021-02-14 DIAGNOSIS — I5032 Chronic diastolic (congestive) heart failure: Secondary | ICD-10-CM | POA: Diagnosis not present

## 2021-02-14 DIAGNOSIS — G8929 Other chronic pain: Secondary | ICD-10-CM | POA: Diagnosis not present

## 2021-02-14 DIAGNOSIS — E876 Hypokalemia: Secondary | ICD-10-CM | POA: Diagnosis not present

## 2021-02-14 DIAGNOSIS — I130 Hypertensive heart and chronic kidney disease with heart failure and stage 1 through stage 4 chronic kidney disease, or unspecified chronic kidney disease: Secondary | ICD-10-CM | POA: Diagnosis not present

## 2021-02-14 DIAGNOSIS — I872 Venous insufficiency (chronic) (peripheral): Secondary | ICD-10-CM | POA: Diagnosis not present

## 2021-02-14 DIAGNOSIS — J302 Other seasonal allergic rhinitis: Secondary | ICD-10-CM | POA: Diagnosis not present

## 2021-02-14 DIAGNOSIS — E785 Hyperlipidemia, unspecified: Secondary | ICD-10-CM | POA: Diagnosis not present

## 2021-02-14 DIAGNOSIS — G9340 Encephalopathy, unspecified: Secondary | ICD-10-CM | POA: Diagnosis not present

## 2021-02-14 DIAGNOSIS — D509 Iron deficiency anemia, unspecified: Secondary | ICD-10-CM | POA: Diagnosis not present

## 2021-02-14 DIAGNOSIS — E1122 Type 2 diabetes mellitus with diabetic chronic kidney disease: Secondary | ICD-10-CM | POA: Diagnosis not present

## 2021-02-14 DIAGNOSIS — Z6824 Body mass index (BMI) 24.0-24.9, adult: Secondary | ICD-10-CM | POA: Diagnosis not present

## 2021-02-14 DIAGNOSIS — M48062 Spinal stenosis, lumbar region with neurogenic claudication: Secondary | ICD-10-CM | POA: Diagnosis not present

## 2021-02-14 DIAGNOSIS — E1169 Type 2 diabetes mellitus with other specified complication: Secondary | ICD-10-CM | POA: Diagnosis not present

## 2021-02-14 DIAGNOSIS — L930 Discoid lupus erythematosus: Secondary | ICD-10-CM | POA: Diagnosis not present

## 2021-02-14 DIAGNOSIS — N184 Chronic kidney disease, stage 4 (severe): Secondary | ICD-10-CM | POA: Diagnosis not present

## 2021-02-14 DIAGNOSIS — E1151 Type 2 diabetes mellitus with diabetic peripheral angiopathy without gangrene: Secondary | ICD-10-CM | POA: Diagnosis not present

## 2021-02-16 ENCOUNTER — Inpatient Hospital Stay: Payer: Medicare Other

## 2021-02-16 ENCOUNTER — Inpatient Hospital Stay: Payer: Medicare Other | Attending: Hematology

## 2021-02-16 ENCOUNTER — Other Ambulatory Visit: Payer: Self-pay

## 2021-02-16 VITALS — BP 146/70 | HR 85 | Temp 98.4°F | Resp 18

## 2021-02-16 DIAGNOSIS — N184 Chronic kidney disease, stage 4 (severe): Secondary | ICD-10-CM | POA: Diagnosis not present

## 2021-02-16 DIAGNOSIS — D631 Anemia in chronic kidney disease: Secondary | ICD-10-CM | POA: Diagnosis not present

## 2021-02-16 DIAGNOSIS — D509 Iron deficiency anemia, unspecified: Secondary | ICD-10-CM

## 2021-02-16 DIAGNOSIS — D464 Refractory anemia, unspecified: Secondary | ICD-10-CM

## 2021-02-16 LAB — CBC WITH DIFFERENTIAL (CANCER CENTER ONLY)
Abs Immature Granulocytes: 0.02 10*3/uL (ref 0.00–0.07)
Basophils Absolute: 0 10*3/uL (ref 0.0–0.1)
Basophils Relative: 0 %
Eosinophils Absolute: 0.1 10*3/uL (ref 0.0–0.5)
Eosinophils Relative: 1 %
HCT: 33.9 % — ABNORMAL LOW (ref 36.0–46.0)
Hemoglobin: 10.5 g/dL — ABNORMAL LOW (ref 12.0–15.0)
Immature Granulocytes: 0 %
Lymphocytes Relative: 16 %
Lymphs Abs: 1.4 10*3/uL (ref 0.7–4.0)
MCH: 26.7 pg (ref 26.0–34.0)
MCHC: 31 g/dL (ref 30.0–36.0)
MCV: 86.3 fL (ref 80.0–100.0)
Monocytes Absolute: 0.6 10*3/uL (ref 0.1–1.0)
Monocytes Relative: 7 %
Neutro Abs: 6.3 10*3/uL (ref 1.7–7.7)
Neutrophils Relative %: 76 %
Platelet Count: 240 10*3/uL (ref 150–400)
RBC: 3.93 MIL/uL (ref 3.87–5.11)
RDW: 15 % (ref 11.5–15.5)
WBC Count: 8.4 10*3/uL (ref 4.0–10.5)
nRBC: 0 % (ref 0.0–0.2)

## 2021-02-16 MED ORDER — EPOETIN ALFA-EPBX 40000 UNIT/ML IJ SOLN
40000.0000 [IU] | Freq: Once | INTRAMUSCULAR | Status: AC
  Administered 2021-02-16: 40000 [IU] via SUBCUTANEOUS
  Filled 2021-02-16: qty 1

## 2021-02-16 NOTE — Patient Instructions (Signed)
Epoetin Alfa injection °What is this medication? °EPOETIN ALFA (e POE e tin AL fa) helps your body make more red blood cells. This medicine is used to treat anemia caused by chronic kidney disease, cancer chemotherapy, or HIV-therapy. It may also be used before surgery if you have anemia. °This medicine may be used for other purposes; ask your health care provider or pharmacist if you have questions. °COMMON BRAND NAME(S): Epogen, Procrit, Retacrit °What should I tell my care team before I take this medication? °They need to know if you have any of these conditions: °cancer °heart disease °high blood pressure °history of blood clots °history of stroke °low levels of folate, iron, or vitamin B12 in the blood °seizures °an unusual or allergic reaction to erythropoietin, albumin, benzyl alcohol, hamster proteins, other medicines, foods, dyes, or preservatives °pregnant or trying to get pregnant °breast-feeding °How should I use this medication? °This medicine is for injection into a vein or under the skin. It is usually given by a health care professional in a hospital or clinic setting. °If you get this medicine at home, you will be taught how to prepare and give this medicine. Use exactly as directed. Take your medicine at regular intervals. Do not take your medicine more often than directed. °It is important that you put your used needles and syringes in a special sharps container. Do not put them in a trash can. If you do not have a sharps container, call your pharmacist or healthcare provider to get one. °A special MedGuide will be given to you by the pharmacist with each prescription and refill. Be sure to read this information carefully each time. °Talk to your pediatrician regarding the use of this medicine in children. While this drug may be prescribed for selected conditions, precautions do apply. °Overdosage: If you think you have taken too much of this medicine contact a poison control center or emergency  room at once. °NOTE: This medicine is only for you. Do not share this medicine with others. °What if I miss a dose? °If you miss a dose, take it as soon as you can. If it is almost time for your next dose, take only that dose. Do not take double or extra doses. °What may interact with this medication? °Interactions have not been studied. °This list may not describe all possible interactions. Give your health care provider a list of all the medicines, herbs, non-prescription drugs, or dietary supplements you use. Also tell them if you smoke, drink alcohol, or use illegal drugs. Some items may interact with your medicine. °What should I watch for while using this medication? °Your condition will be monitored carefully while you are receiving this medicine. °You may need blood work done while you are taking this medicine. °This medicine may cause a decrease in vitamin B6. You should make sure that you get enough vitamin B6 while you are taking this medicine. Discuss the foods you eat and the vitamins you take with your health care professional. °What side effects may I notice from receiving this medication? °Side effects that you should report to your doctor or health care professional as soon as possible: °allergic reactions like skin rash, itching or hives, swelling of the face, lips, or tongue °seizures °signs and symptoms of a blood clot such as breathing problems; changes in vision; chest pain; severe, sudden headache; pain, swelling, warmth in the leg; trouble speaking; sudden numbness or weakness of the face, arm or leg °signs and symptoms of a stroke like   changes in vision; confusion; trouble speaking or understanding; severe headaches; sudden numbness or weakness of the face, arm or leg; trouble walking; dizziness; loss of balance or coordination °Side effects that usually do not require medical attention (report to your doctor or health care professional if they continue or are  bothersome): °chills °cough °dizziness °fever °headaches °joint pain °muscle cramps °muscle pain °nausea, vomiting °pain, redness, or irritation at site where injected °This list may not describe all possible side effects. Call your doctor for medical advice about side effects. You may report side effects to FDA at 1-800-FDA-1088. °Where should I keep my medication? °Keep out of the reach of children. °Store in a refrigerator between 2 and 8 degrees C (36 and 46 degrees F). Do not freeze or shake. Throw away any unused portion if using a single-dose vial. Multi-dose vials can be kept in the refrigerator for up to 21 days after the initial dose. Throw away unused medicine. °NOTE: This sheet is a summary. It may not cover all possible information. If you have questions about this medicine, talk to your doctor, pharmacist, or health care provider. °© 2022 [employer]/Gold Standard (2016-11-06 00:00:00) ° °

## 2021-02-28 DIAGNOSIS — I82451 Acute embolism and thrombosis of right peroneal vein: Secondary | ICD-10-CM | POA: Diagnosis not present

## 2021-02-28 DIAGNOSIS — I129 Hypertensive chronic kidney disease with stage 1 through stage 4 chronic kidney disease, or unspecified chronic kidney disease: Secondary | ICD-10-CM | POA: Diagnosis not present

## 2021-02-28 DIAGNOSIS — D631 Anemia in chronic kidney disease: Secondary | ICD-10-CM | POA: Diagnosis not present

## 2021-02-28 DIAGNOSIS — N2581 Secondary hyperparathyroidism of renal origin: Secondary | ICD-10-CM | POA: Diagnosis not present

## 2021-02-28 DIAGNOSIS — N184 Chronic kidney disease, stage 4 (severe): Secondary | ICD-10-CM | POA: Diagnosis not present

## 2021-02-28 DIAGNOSIS — I5032 Chronic diastolic (congestive) heart failure: Secondary | ICD-10-CM | POA: Diagnosis not present

## 2021-02-28 DIAGNOSIS — E1122 Type 2 diabetes mellitus with diabetic chronic kidney disease: Secondary | ICD-10-CM | POA: Diagnosis not present

## 2021-03-02 ENCOUNTER — Inpatient Hospital Stay: Payer: Medicare Other

## 2021-03-02 ENCOUNTER — Other Ambulatory Visit: Payer: Self-pay

## 2021-03-02 VITALS — BP 145/64 | HR 91 | Resp 18

## 2021-03-02 DIAGNOSIS — L930 Discoid lupus erythematosus: Secondary | ICD-10-CM | POA: Diagnosis not present

## 2021-03-02 DIAGNOSIS — E1169 Type 2 diabetes mellitus with other specified complication: Secondary | ICD-10-CM | POA: Diagnosis not present

## 2021-03-02 DIAGNOSIS — I951 Orthostatic hypotension: Secondary | ICD-10-CM | POA: Diagnosis not present

## 2021-03-02 DIAGNOSIS — I5032 Chronic diastolic (congestive) heart failure: Secondary | ICD-10-CM | POA: Diagnosis not present

## 2021-03-02 DIAGNOSIS — I872 Venous insufficiency (chronic) (peripheral): Secondary | ICD-10-CM | POA: Diagnosis not present

## 2021-03-02 DIAGNOSIS — E1122 Type 2 diabetes mellitus with diabetic chronic kidney disease: Secondary | ICD-10-CM | POA: Diagnosis not present

## 2021-03-02 DIAGNOSIS — E440 Moderate protein-calorie malnutrition: Secondary | ICD-10-CM | POA: Diagnosis not present

## 2021-03-02 DIAGNOSIS — E1140 Type 2 diabetes mellitus with diabetic neuropathy, unspecified: Secondary | ICD-10-CM | POA: Diagnosis not present

## 2021-03-02 DIAGNOSIS — D464 Refractory anemia, unspecified: Secondary | ICD-10-CM

## 2021-03-02 DIAGNOSIS — E559 Vitamin D deficiency, unspecified: Secondary | ICD-10-CM | POA: Diagnosis not present

## 2021-03-02 DIAGNOSIS — D509 Iron deficiency anemia, unspecified: Secondary | ICD-10-CM

## 2021-03-02 DIAGNOSIS — E785 Hyperlipidemia, unspecified: Secondary | ICD-10-CM | POA: Diagnosis not present

## 2021-03-02 DIAGNOSIS — E1151 Type 2 diabetes mellitus with diabetic peripheral angiopathy without gangrene: Secondary | ICD-10-CM | POA: Diagnosis not present

## 2021-03-02 DIAGNOSIS — G9340 Encephalopathy, unspecified: Secondary | ICD-10-CM | POA: Diagnosis not present

## 2021-03-02 DIAGNOSIS — Z6824 Body mass index (BMI) 24.0-24.9, adult: Secondary | ICD-10-CM | POA: Diagnosis not present

## 2021-03-02 DIAGNOSIS — I130 Hypertensive heart and chronic kidney disease with heart failure and stage 1 through stage 4 chronic kidney disease, or unspecified chronic kidney disease: Secondary | ICD-10-CM | POA: Diagnosis not present

## 2021-03-02 DIAGNOSIS — G8929 Other chronic pain: Secondary | ICD-10-CM | POA: Diagnosis not present

## 2021-03-02 DIAGNOSIS — M47816 Spondylosis without myelopathy or radiculopathy, lumbar region: Secondary | ICD-10-CM | POA: Diagnosis not present

## 2021-03-02 DIAGNOSIS — E876 Hypokalemia: Secondary | ICD-10-CM | POA: Diagnosis not present

## 2021-03-02 DIAGNOSIS — M48062 Spinal stenosis, lumbar region with neurogenic claudication: Secondary | ICD-10-CM | POA: Diagnosis not present

## 2021-03-02 DIAGNOSIS — E538 Deficiency of other specified B group vitamins: Secondary | ICD-10-CM | POA: Diagnosis not present

## 2021-03-02 DIAGNOSIS — J302 Other seasonal allergic rhinitis: Secondary | ICD-10-CM | POA: Diagnosis not present

## 2021-03-02 DIAGNOSIS — D631 Anemia in chronic kidney disease: Secondary | ICD-10-CM | POA: Diagnosis not present

## 2021-03-02 DIAGNOSIS — N184 Chronic kidney disease, stage 4 (severe): Secondary | ICD-10-CM | POA: Diagnosis not present

## 2021-03-02 DIAGNOSIS — M109 Gout, unspecified: Secondary | ICD-10-CM | POA: Diagnosis not present

## 2021-03-02 LAB — CBC WITH DIFFERENTIAL (CANCER CENTER ONLY)
Abs Immature Granulocytes: 0.04 10*3/uL (ref 0.00–0.07)
Basophils Absolute: 0 10*3/uL (ref 0.0–0.1)
Basophils Relative: 1 %
Eosinophils Absolute: 0 10*3/uL (ref 0.0–0.5)
Eosinophils Relative: 0 %
HCT: 34.8 % — ABNORMAL LOW (ref 36.0–46.0)
Hemoglobin: 10.9 g/dL — ABNORMAL LOW (ref 12.0–15.0)
Immature Granulocytes: 1 %
Lymphocytes Relative: 19 %
Lymphs Abs: 1.4 10*3/uL (ref 0.7–4.0)
MCH: 26.7 pg (ref 26.0–34.0)
MCHC: 31.3 g/dL (ref 30.0–36.0)
MCV: 85.1 fL (ref 80.0–100.0)
Monocytes Absolute: 0.6 10*3/uL (ref 0.1–1.0)
Monocytes Relative: 9 %
Neutro Abs: 5.3 10*3/uL (ref 1.7–7.7)
Neutrophils Relative %: 70 %
Platelet Count: 251 10*3/uL (ref 150–400)
RBC: 4.09 MIL/uL (ref 3.87–5.11)
RDW: 15.6 % — ABNORMAL HIGH (ref 11.5–15.5)
WBC Count: 7.4 10*3/uL (ref 4.0–10.5)
nRBC: 0 % (ref 0.0–0.2)

## 2021-03-02 MED ORDER — EPOETIN ALFA-EPBX 40000 UNIT/ML IJ SOLN
40000.0000 [IU] | Freq: Once | INTRAMUSCULAR | Status: AC
  Administered 2021-03-02: 40000 [IU] via SUBCUTANEOUS
  Filled 2021-03-02: qty 1

## 2021-03-02 NOTE — Patient Instructions (Signed)
Epoetin Alfa injection °What is this medication? °EPOETIN ALFA (e POE e tin AL fa) helps your body make more red blood cells. This medicine is used to treat anemia caused by chronic kidney disease, cancer chemotherapy, or HIV-therapy. It may also be used before surgery if you have anemia. °This medicine may be used for other purposes; ask your health care provider or pharmacist if you have questions. °COMMON BRAND NAME(S): Epogen, Procrit, Retacrit °What should I tell my care team before I take this medication? °They need to know if you have any of these conditions: °cancer °heart disease °high blood pressure °history of blood clots °history of stroke °low levels of folate, iron, or vitamin B12 in the blood °seizures °an unusual or allergic reaction to erythropoietin, albumin, benzyl alcohol, hamster proteins, other medicines, foods, dyes, or preservatives °pregnant or trying to get pregnant °breast-feeding °How should I use this medication? °This medicine is for injection into a vein or under the skin. It is usually given by a health care professional in a hospital or clinic setting. °If you get this medicine at home, you will be taught how to prepare and give this medicine. Use exactly as directed. Take your medicine at regular intervals. Do not take your medicine more often than directed. °It is important that you put your used needles and syringes in a special sharps container. Do not put them in a trash can. If you do not have a sharps container, call your pharmacist or healthcare provider to get one. °A special MedGuide will be given to you by the pharmacist with each prescription and refill. Be sure to read this information carefully each time. °Talk to your pediatrician regarding the use of this medicine in children. While this drug may be prescribed for selected conditions, precautions do apply. °Overdosage: If you think you have taken too much of this medicine contact a poison control center or emergency  room at once. °NOTE: This medicine is only for you. Do not share this medicine with others. °What if I miss a dose? °If you miss a dose, take it as soon as you can. If it is almost time for your next dose, take only that dose. Do not take double or extra doses. °What may interact with this medication? °Interactions have not been studied. °This list may not describe all possible interactions. Give your health care provider a list of all the medicines, herbs, non-prescription drugs, or dietary supplements you use. Also tell them if you smoke, drink alcohol, or use illegal drugs. Some items may interact with your medicine. °What should I watch for while using this medication? °Your condition will be monitored carefully while you are receiving this medicine. °You may need blood work done while you are taking this medicine. °This medicine may cause a decrease in vitamin B6. You should make sure that you get enough vitamin B6 while you are taking this medicine. Discuss the foods you eat and the vitamins you take with your health care professional. °What side effects may I notice from receiving this medication? °Side effects that you should report to your doctor or health care professional as soon as possible: °allergic reactions like skin rash, itching or hives, swelling of the face, lips, or tongue °seizures °signs and symptoms of a blood clot such as breathing problems; changes in vision; chest pain; severe, sudden headache; pain, swelling, warmth in the leg; trouble speaking; sudden numbness or weakness of the face, arm or leg °signs and symptoms of a stroke like   changes in vision; confusion; trouble speaking or understanding; severe headaches; sudden numbness or weakness of the face, arm or leg; trouble walking; dizziness; loss of balance or coordination °Side effects that usually do not require medical attention (report to your doctor or health care professional if they continue or are  bothersome): °chills °cough °dizziness °fever °headaches °joint pain °muscle cramps °muscle pain °nausea, vomiting °pain, redness, or irritation at site where injected °This list may not describe all possible side effects. Call your doctor for medical advice about side effects. You may report side effects to FDA at 1-800-FDA-1088. °Where should I keep my medication? °Keep out of the reach of children. °Store in a refrigerator between 2 and 8 degrees C (36 and 46 degrees F). Do not freeze or shake. Throw away any unused portion if using a single-dose vial. Multi-dose vials can be kept in the refrigerator for up to 21 days after the initial dose. Throw away unused medicine. °NOTE: This sheet is a summary. It may not cover all possible information. If you have questions about this medicine, talk to your doctor, pharmacist, or health care provider. °© 2022 [employer]/Gold Standard (2016-11-06 00:00:00) ° °

## 2021-03-15 DIAGNOSIS — Z6824 Body mass index (BMI) 24.0-24.9, adult: Secondary | ICD-10-CM | POA: Diagnosis not present

## 2021-03-15 DIAGNOSIS — J302 Other seasonal allergic rhinitis: Secondary | ICD-10-CM | POA: Diagnosis not present

## 2021-03-15 DIAGNOSIS — M48062 Spinal stenosis, lumbar region with neurogenic claudication: Secondary | ICD-10-CM | POA: Diagnosis not present

## 2021-03-15 DIAGNOSIS — I951 Orthostatic hypotension: Secondary | ICD-10-CM | POA: Diagnosis not present

## 2021-03-15 DIAGNOSIS — E559 Vitamin D deficiency, unspecified: Secondary | ICD-10-CM | POA: Diagnosis not present

## 2021-03-15 DIAGNOSIS — E538 Deficiency of other specified B group vitamins: Secondary | ICD-10-CM | POA: Diagnosis not present

## 2021-03-15 DIAGNOSIS — L930 Discoid lupus erythematosus: Secondary | ICD-10-CM | POA: Diagnosis not present

## 2021-03-15 DIAGNOSIS — E1151 Type 2 diabetes mellitus with diabetic peripheral angiopathy without gangrene: Secondary | ICD-10-CM | POA: Diagnosis not present

## 2021-03-15 DIAGNOSIS — E876 Hypokalemia: Secondary | ICD-10-CM | POA: Diagnosis not present

## 2021-03-15 DIAGNOSIS — E785 Hyperlipidemia, unspecified: Secondary | ICD-10-CM | POA: Diagnosis not present

## 2021-03-15 DIAGNOSIS — E1122 Type 2 diabetes mellitus with diabetic chronic kidney disease: Secondary | ICD-10-CM | POA: Diagnosis not present

## 2021-03-15 DIAGNOSIS — G9340 Encephalopathy, unspecified: Secondary | ICD-10-CM | POA: Diagnosis not present

## 2021-03-15 DIAGNOSIS — G8929 Other chronic pain: Secondary | ICD-10-CM | POA: Diagnosis not present

## 2021-03-15 DIAGNOSIS — N184 Chronic kidney disease, stage 4 (severe): Secondary | ICD-10-CM | POA: Diagnosis not present

## 2021-03-15 DIAGNOSIS — M47816 Spondylosis without myelopathy or radiculopathy, lumbar region: Secondary | ICD-10-CM | POA: Diagnosis not present

## 2021-03-15 DIAGNOSIS — E1140 Type 2 diabetes mellitus with diabetic neuropathy, unspecified: Secondary | ICD-10-CM | POA: Diagnosis not present

## 2021-03-15 DIAGNOSIS — E440 Moderate protein-calorie malnutrition: Secondary | ICD-10-CM | POA: Diagnosis not present

## 2021-03-15 DIAGNOSIS — I5032 Chronic diastolic (congestive) heart failure: Secondary | ICD-10-CM | POA: Diagnosis not present

## 2021-03-15 DIAGNOSIS — D509 Iron deficiency anemia, unspecified: Secondary | ICD-10-CM | POA: Diagnosis not present

## 2021-03-15 DIAGNOSIS — E1169 Type 2 diabetes mellitus with other specified complication: Secondary | ICD-10-CM | POA: Diagnosis not present

## 2021-03-15 DIAGNOSIS — I130 Hypertensive heart and chronic kidney disease with heart failure and stage 1 through stage 4 chronic kidney disease, or unspecified chronic kidney disease: Secondary | ICD-10-CM | POA: Diagnosis not present

## 2021-03-15 DIAGNOSIS — I872 Venous insufficiency (chronic) (peripheral): Secondary | ICD-10-CM | POA: Diagnosis not present

## 2021-03-15 DIAGNOSIS — M109 Gout, unspecified: Secondary | ICD-10-CM | POA: Diagnosis not present

## 2021-03-16 ENCOUNTER — Ambulatory Visit: Payer: Medicare Other

## 2021-03-16 ENCOUNTER — Ambulatory Visit: Payer: Medicare Other | Admitting: Hematology

## 2021-03-16 ENCOUNTER — Other Ambulatory Visit: Payer: Medicare Other

## 2021-03-16 ENCOUNTER — Other Ambulatory Visit: Payer: Self-pay | Admitting: Hematology

## 2021-03-16 DIAGNOSIS — D509 Iron deficiency anemia, unspecified: Secondary | ICD-10-CM

## 2021-03-17 DIAGNOSIS — J069 Acute upper respiratory infection, unspecified: Secondary | ICD-10-CM | POA: Diagnosis not present

## 2021-03-17 DIAGNOSIS — R197 Diarrhea, unspecified: Secondary | ICD-10-CM | POA: Diagnosis not present

## 2021-03-17 DIAGNOSIS — U071 COVID-19: Secondary | ICD-10-CM | POA: Diagnosis not present

## 2021-03-27 DIAGNOSIS — E1140 Type 2 diabetes mellitus with diabetic neuropathy, unspecified: Secondary | ICD-10-CM | POA: Diagnosis not present

## 2021-03-27 DIAGNOSIS — G8929 Other chronic pain: Secondary | ICD-10-CM | POA: Diagnosis not present

## 2021-03-27 DIAGNOSIS — E1122 Type 2 diabetes mellitus with diabetic chronic kidney disease: Secondary | ICD-10-CM | POA: Diagnosis not present

## 2021-03-27 DIAGNOSIS — I872 Venous insufficiency (chronic) (peripheral): Secondary | ICD-10-CM | POA: Diagnosis not present

## 2021-03-27 DIAGNOSIS — M109 Gout, unspecified: Secondary | ICD-10-CM | POA: Diagnosis not present

## 2021-03-27 DIAGNOSIS — E1169 Type 2 diabetes mellitus with other specified complication: Secondary | ICD-10-CM | POA: Diagnosis not present

## 2021-03-27 DIAGNOSIS — M47816 Spondylosis without myelopathy or radiculopathy, lumbar region: Secondary | ICD-10-CM | POA: Diagnosis not present

## 2021-03-27 DIAGNOSIS — E1151 Type 2 diabetes mellitus with diabetic peripheral angiopathy without gangrene: Secondary | ICD-10-CM | POA: Diagnosis not present

## 2021-03-27 DIAGNOSIS — Z6824 Body mass index (BMI) 24.0-24.9, adult: Secondary | ICD-10-CM | POA: Diagnosis not present

## 2021-03-27 DIAGNOSIS — D509 Iron deficiency anemia, unspecified: Secondary | ICD-10-CM | POA: Diagnosis not present

## 2021-03-27 DIAGNOSIS — I130 Hypertensive heart and chronic kidney disease with heart failure and stage 1 through stage 4 chronic kidney disease, or unspecified chronic kidney disease: Secondary | ICD-10-CM | POA: Diagnosis not present

## 2021-03-27 DIAGNOSIS — E559 Vitamin D deficiency, unspecified: Secondary | ICD-10-CM | POA: Diagnosis not present

## 2021-03-27 DIAGNOSIS — G9340 Encephalopathy, unspecified: Secondary | ICD-10-CM | POA: Diagnosis not present

## 2021-03-27 DIAGNOSIS — I5032 Chronic diastolic (congestive) heart failure: Secondary | ICD-10-CM | POA: Diagnosis not present

## 2021-03-27 DIAGNOSIS — E785 Hyperlipidemia, unspecified: Secondary | ICD-10-CM | POA: Diagnosis not present

## 2021-03-27 DIAGNOSIS — E440 Moderate protein-calorie malnutrition: Secondary | ICD-10-CM | POA: Diagnosis not present

## 2021-03-27 DIAGNOSIS — E876 Hypokalemia: Secondary | ICD-10-CM | POA: Diagnosis not present

## 2021-03-27 DIAGNOSIS — J302 Other seasonal allergic rhinitis: Secondary | ICD-10-CM | POA: Diagnosis not present

## 2021-03-27 DIAGNOSIS — I951 Orthostatic hypotension: Secondary | ICD-10-CM | POA: Diagnosis not present

## 2021-03-27 DIAGNOSIS — M48062 Spinal stenosis, lumbar region with neurogenic claudication: Secondary | ICD-10-CM | POA: Diagnosis not present

## 2021-03-27 DIAGNOSIS — E538 Deficiency of other specified B group vitamins: Secondary | ICD-10-CM | POA: Diagnosis not present

## 2021-03-27 DIAGNOSIS — N184 Chronic kidney disease, stage 4 (severe): Secondary | ICD-10-CM | POA: Diagnosis not present

## 2021-03-27 DIAGNOSIS — L930 Discoid lupus erythematosus: Secondary | ICD-10-CM | POA: Diagnosis not present

## 2021-03-28 ENCOUNTER — Ambulatory Visit: Payer: Medicare Other

## 2021-03-28 ENCOUNTER — Other Ambulatory Visit: Payer: Medicare Other

## 2021-03-28 ENCOUNTER — Ambulatory Visit: Payer: Medicare Other | Admitting: Hematology

## 2021-03-30 ENCOUNTER — Inpatient Hospital Stay (HOSPITAL_BASED_OUTPATIENT_CLINIC_OR_DEPARTMENT_OTHER): Payer: Medicare Other | Admitting: Hematology

## 2021-03-30 ENCOUNTER — Inpatient Hospital Stay: Payer: Medicare Other | Attending: Hematology

## 2021-03-30 ENCOUNTER — Inpatient Hospital Stay: Payer: Medicare Other

## 2021-03-30 ENCOUNTER — Other Ambulatory Visit: Payer: Self-pay

## 2021-03-30 VITALS — BP 146/65 | HR 84 | Temp 99.0°F | Resp 18 | Ht 64.0 in | Wt 134.7 lb

## 2021-03-30 DIAGNOSIS — D509 Iron deficiency anemia, unspecified: Secondary | ICD-10-CM

## 2021-03-30 DIAGNOSIS — Z1231 Encounter for screening mammogram for malignant neoplasm of breast: Secondary | ICD-10-CM

## 2021-03-30 DIAGNOSIS — D631 Anemia in chronic kidney disease: Secondary | ICD-10-CM | POA: Diagnosis not present

## 2021-03-30 DIAGNOSIS — N184 Chronic kidney disease, stage 4 (severe): Secondary | ICD-10-CM | POA: Insufficient documentation

## 2021-03-30 DIAGNOSIS — E538 Deficiency of other specified B group vitamins: Secondary | ICD-10-CM | POA: Insufficient documentation

## 2021-03-30 DIAGNOSIS — Z170 Estrogen receptor positive status [ER+]: Secondary | ICD-10-CM | POA: Diagnosis not present

## 2021-03-30 DIAGNOSIS — C50311 Malignant neoplasm of lower-inner quadrant of right female breast: Secondary | ICD-10-CM | POA: Diagnosis not present

## 2021-03-30 DIAGNOSIS — D464 Refractory anemia, unspecified: Secondary | ICD-10-CM

## 2021-03-30 DIAGNOSIS — D649 Anemia, unspecified: Secondary | ICD-10-CM

## 2021-03-30 LAB — CBC WITH DIFFERENTIAL (CANCER CENTER ONLY)
Abs Immature Granulocytes: 0.02 10*3/uL (ref 0.00–0.07)
Basophils Absolute: 0 10*3/uL (ref 0.0–0.1)
Basophils Relative: 0 %
Eosinophils Absolute: 0 10*3/uL (ref 0.0–0.5)
Eosinophils Relative: 0 %
HCT: 35.4 % — ABNORMAL LOW (ref 36.0–46.0)
Hemoglobin: 11.1 g/dL — ABNORMAL LOW (ref 12.0–15.0)
Immature Granulocytes: 0 %
Lymphocytes Relative: 15 %
Lymphs Abs: 0.9 10*3/uL (ref 0.7–4.0)
MCH: 25.8 pg — ABNORMAL LOW (ref 26.0–34.0)
MCHC: 31.4 g/dL (ref 30.0–36.0)
MCV: 82.1 fL (ref 80.0–100.0)
Monocytes Absolute: 0.5 10*3/uL (ref 0.1–1.0)
Monocytes Relative: 8 %
Neutro Abs: 4.9 10*3/uL (ref 1.7–7.7)
Neutrophils Relative %: 77 %
Platelet Count: 238 10*3/uL (ref 150–400)
RBC: 4.31 MIL/uL (ref 3.87–5.11)
RDW: 15.2 % (ref 11.5–15.5)
WBC Count: 6.4 10*3/uL (ref 4.0–10.5)
nRBC: 0 % (ref 0.0–0.2)

## 2021-03-30 LAB — FERRITIN: Ferritin: 154 ng/mL (ref 11–307)

## 2021-03-30 LAB — VITAMIN B12: Vitamin B-12: 852 pg/mL (ref 180–914)

## 2021-03-30 MED ORDER — CYANOCOBALAMIN 1000 MCG/ML IJ SOLN
1000.0000 ug | Freq: Once | INTRAMUSCULAR | Status: AC
  Administered 2021-03-30: 1000 ug via INTRAMUSCULAR
  Filled 2021-03-30: qty 1

## 2021-03-30 NOTE — Progress Notes (Signed)
[place]   [phone] [phone]   Clinic Follow up Note   Patient Care Team: Seward Carol, MD as PCP - General (Internal Medicine) Josue Hector, MD as PCP - Cardiology (Cardiology) Thelma Comp, [place] as Consulting Physician (Optometry) Stark Klein, MD as Consulting Physician (General Surgery) Truitt Merle, MD as Consulting Physician (Hematology) Madelon Lips, MD as Consulting Physician (Nephrology)  Date of Service:  03/30/2021  CHIEF COMPLAINT: f/u of right breast cancer and anemia of chronic disease  CURRENT THERAPY:  -Retacrit injection starting 09/2018, currently q2weeks. Increased to 20K units q2weeks starting 06/29/19. 40K unit every 2 weeks on 08/13/19. Reduced to every 4 weeks from 02/01/20. Increased to every 2 weeks from 07/29/20.  -IV Feraheme as needed starting 09/15/18. Last on 08/10/19 -Oral Ferrous sulfate daily  -B12 injection monthly starting 05/04/19. Increased to every 2 weeks on 06/29/19. Reduced to every 4 weeks from 02/01/20. currently on every 6 weeks   ASSESSMENT & PLAN:  [name] is a 76 y.o. female with   1. Anemia of chronic disease (CKD stage IV), Iron and B12 deficiency and GI bleeding  -Her 04/2018 Colonoscopy with Dr Carlean Purl showed no evidence of GI bleeding or source of bleeding. Her anemia is probably related to her CKD, however her anemia is out of proportion of her stage IV CKD related anemia.  -Her 09/09/18 bone marrow biopsy results showed hypercellular marrow, no evidence of MDS or other concerns. Her Cytogenetics is still pending.  -She has been receiving IV iron as needed, for ferritin <100.  -Started receiving monthly B12 injections in our clinic on 05/04/19. currently on every 6 weeks  -Her insurance did not approve Aranesp so she was switched to Retacrit every 2 weeks on 11/14/18. Goal is Hg 9-11. Due to worsened anemia, her dose has been increased, currently at 40,000 every 4 weeks. -labs  reviewed, hgb 11.1. We will hold the Retacrit today -Continue Retacrit every 2 weeks with lab (goal Hg 9-11). Will give IV iron if ferritin less than 100.  -continue B12 injection every 6 weeks  -F/u in 12 weeks    2. Breast cancer of the lower inner quadrant of the right breast, invasive ductal carcinoma with DCIS, pT1bN0Mo, stage IA,   ER+ and PR +, HER 2 negative -She was diagnosed in 02/2013 and treated with right lumpectomy, adjuvant radiation and Anastrozole for 4 years until 07/2017.  Anastrozole was stopped after she developed septic shock from pneumonia and a cardiac arrest, and we decided not restart due to the risk of cardiac toxicity. -Her 07/11/20 Mammogram showed suspicious right breast mass. She underwent breast biopsy on 07/28/20 which showed fibroadipose and necrosis, no malignancy. This area is palpable on exam today. We will continue to monitor. -she is clinically doing well. Next mammogram in 06/2021; I ordered today.   3. Comorbidities: DM, HTN, CAD; Chronic renal insufficieny, CKD stage IV -Continue medications managed by PCP and Dr. Johnsie Cancel the cardiologist.  -She follows up with Dr. Hollie Salk for her kidney function. -She was hospitalized for CHF twice in 07/2020.   4. Genetics results negative    5. Bone Health, Low Back pain, from degenerative arthritis and spinal stenosis -DEXA from 08/08/20 was normal -Her VitD level is low. She will continue 1000u daily.  -back pain is tolerable and stable. Continue f/u with Orthopedics    6. Left Renal Vein Thrombosis in 07/2017 -Likely provoked from acute illness and prior SBO -She stopped Xarelto in 01/2018 -Her  hypercoagulopathy work-up in November 2019 was negative   7. History of pneumonia and cardiac arrest in 07/2017   8. Covid-19+ 11/01/20, 03/26/20     PLAN: -Proceed with B12 only today  -Lab and Retacrit every 2 weeks and B12 every 6 weeks, will give IV Iron if Ferritin less than 100.  -F/u in 12 weeks -mammogram due late  06/2021   No problem-specific Assessment & Plan notes found for this encounter.   SUMMARY OF ONCOLOGIC HISTORY: Oncology History Overview Note  Cancer Staging Breast cancer of lower-inner quadrant of right female breast [facility]) Staging form: Breast, AJCC 7th Edition - Clinical: Stage IA (T1b, N0, cM0) - Unsigned Staging comments: Staged at breast conference 03/25/13.  - Pathologic stage from 05/12/2013: Stage IA (T1b, N0, cM0) - Signed by Truitt Merle, MD on 06/21/2016     Breast cancer of lower-inner quadrant of right female breast ([place])  02/27/2013 Mammogram   Diagnostic mammogram and ultrasound showed a 6 x 5 mm mass at 5:00 position. Axilla was negative by ultrasound.   03/16/2013 Initial Biopsy   Right breast 5:00 position biopsy showed invasive ductal carcinoma, low-grade   03/16/2013 Receptors her2   ER100% positive, PR 100% positive, HER-2 negative, Ki-67 20%   03/18/2013 Initial Diagnosis   Breast cancer of lower-inner quadrant of right female breast ([place])   05/12/2013 Surgery   Right breast lumpectomy with sentinel lymph node biopsy   05/12/2013 Pathology Results   Right breast lumpectomy showed invasive ductal carcinoma, grade 2, 0.9 cm, 6 sentinel lymph nodes were all negative. Margins were negative.   05/12/2013 Oncotype testing   Oncotype recurrence score 19, intermediate risk, 10 year risk of distant recurrence 12% with tamoxifen for 5 years. Patient declined adjuvant chemotherapy.   06/02/2013 - 07/16/2013 Radiation Therapy   Adjuvant breast radiation with boost 1600Gy   08/06/2013 - 07/2017 Anti-estrogen oral therapy   Arimidex 1 mg daily. Stopped in 07/2017 due to acute illness    03/22/2017 Mammogram   IMPRESSION: No mammographic evidence for malignancy.   Iron deficiency anemia     INTERVAL HISTORY:  [name] is here for a follow up of breast cancer and anemia. She was last seen by me on 12/22/20. She presents to the clinic alone. She reports she is  doing well overall; she notes her energy level is about the same. She reports she is eating well. She did lost about 10 lbs since her last visit (over 3 months); she notes she has not changed her diet or exercise but adds that her weight does fluctuate. She endorses using Remeron, which she finds helpful.   All other systems were reviewed with the patient and are negative.  MEDICAL HISTORY:  Past Medical History:  Diagnosis Date   Abdominal discomfort    Chronic N/V/D. Presumptive dx Crohn's dx per elevated p ANCA. Failed Entocort and Pentasa. Sep 2003 - ileocolectomy c anastomosis per Dr Deon Pilling 2/2 adhesions - path was hegative for Crohns. EGD, Sm bowel follow through (11/03), and an eteroclysis (10/03) were unrevealing. Cuases hypomag and hypocalcemia.   Adnexal mass 8/03   s/p lap BSO (R ovarian fibroma) & lysis of adhesions   Allergy    Seasonal   Anemia    Multifactorial. Baseline HgB 10-11 ish. B12 def - 150 in 3/10. Fe Def - ferritin 35 3/10. Both are being repleted.   Breast cancer ([place]) 03/16/13   right, 5 o'clock   CAD (coronary artery disease) 1996   1996 -  PTCA and angioplasty diagonal branch. 2000 - Rotoblator & angiopllasty of diagonal. 2006 - subendocardial AMI, DES to proximal LAD.Marland Kitchen Also had 90% stenosis in distal apical LAD. EF 55 with apical hypokinesis. Indefinite ASA and Plavix.   CHF (congestive heart failure) (HCC)    Chronic kidney disease    Chronic renal insuff baseline Cr 1.2 - 1.4 ish.   Chronic pain    CT 10/10 = Spinal stenosis L2 - S1.   Diabetes mellitus    Insulin dependent   Gout    Hx of radiation therapy 06/02/13- 07/16/13   right rbeast 4500 cGy 25 sessions, right breast boost 1600 cGy in 8 sessions   Hyperlipidemia    Managed with both a statin and Welchol. Welchol stopped 2014 2/2 cost and started on fenofibrate    Hypertension    2006 B renal arteries patent. 2003 MRA - no RAS. 2003 pheo W/U Dr Hassell Done reportedly negative.   Hypoxia 07/23/2017    Lupus ([place])    Lymphedema of breast    Personal history of radiation therapy 2015   RBBB    Renal vein thrombosis (H[facility])    SBO (small bowel obstruction) ([place]) 09/17/2017   Secondary hyperparathyroidism ([place])    Stroke ([place])    Incidental finding MRI 2002 L lacunar infarct   Vitamin B12 deficiency    Vitamin D deficiency    Wears dentures    top    SURGICAL HISTORY: Past Surgical History:  Procedure Laterality Date   ABDOMINAL HYSTERECTOMY     BILATERAL SALPINGOOPHORECTOMY  8/03   Lap BSO (R ovarian fibroma) and adhesion lysis   BIOPSY  09/08/2020   Procedure: BIOPSY;  Surgeon: Ronald Lobo, MD;  Location: [facility];  Service: Endoscopy;;   BOWEL RESECTION  2003   ileocolectomy with anastomosis 2/2 adhesions   BREAST BIOPSY Right 2015   BREAST BIOPSY Right 07/2014   BREAST LUMPECTOMY Right 04/22/2013   BREAST LUMPECTOMY WITH NEEDLE LOCALIZATION AND AXILLARY SENTINEL LYMPH NODE BX Right 04/22/2013   Procedure: BREAST LUMPECTOMY WITH NEEDLE LOCALIZATION AND AXILLARY SENTINEL LYMPH NODE BX;  Surgeon: Stark Klein, MD;  Location: [place] [facility];  Service: General;  Laterality: Right;   CARDIAC CATHETERIZATION     2 stents   CHOLECYSTECTOMY     COLONOSCOPY     ESOPHAGOGASTRODUODENOSCOPY N/A 09/08/2020   Procedure: ESOPHAGOGASTRODUODENOSCOPY (EGD);  Surgeon: Ronald Lobo, MD;  Location: [facility];  Service: Endoscopy;  Laterality: N/A;   HEMICOLECTOMY     R sided hemicolectomy   HERNIA REPAIR     Ventral hernia repair   PTCA  4/06    I have reviewed the social history and family history with the patient and they are unchanged from previous note.  ALLERGIES:  is allergic to percocet [oxycodone-acetaminophen]; diazepam; haloperidol lactate; lorazepam; morphine sulfate; propoxyphene hcl; tramadol hcl; anesthetics, halogenated; etomidate; fentanyl; haloperidol; morphine sulfate; oxycodone-acetaminophen; propoxyphene; tramadol hcl; and versed  [midazolam].  MEDICATIONS:  Current Outpatient Medications  Medication Sig Dispense Refill   acetaminophen (TYLENOL) 500 MG tablet Take 1,000 mg by mouth every 6 (six) hours as needed for moderate pain or headache.     apixaban (ELIQUIS) 2.5 MG TABS tablet Take 1 tablet (2.5 mg total) by mouth 2 (two) times daily. 60 tablet 0   Artificial Tear Ointment (DRY EYES OP) Place 1 drop into the right eye 2 (two) times daily as needed (dry eye).     budesonide (ENTOCORT EC) 3 MG 24 hr capsule Take 1 capsule by mouth  once daily (Patient taking differently: Take 3 mg by mouth daily.) 30 capsule 0   Cyanocobalamin (B-12 COMPLIANCE INJECTION IJ) Inject 1 vial as directed every 30 (thirty) days.     Epoetin Alfa-epbx (RETACRIT IJ) Inject 1 vial as directed every 30 (thirty) days.     ergocalciferol (VITAMIN D2) 1.25 MG (50000 UT) capsule Take 1 capsule (50,000 Units total) by mouth once a week. 4 capsule 1   famotidine (PEPCID) 10 MG tablet Take 1 tablet (10 mg total) by mouth at bedtime. 30 tablet 0   furosemide (LASIX) 20 MG tablet Take 1 tablet (20 mg total) by mouth daily as needed (weight gain of >3Lbs in 1 day or >5 Lbs in 2 days.). 30 tablet 0   lipase/protease/amylase (CREON) 36000 UNITS CPEP capsule Take 36,000 Units by mouth 3 (three) times daily with meals.     loperamide (IMODIUM A-D) 2 MG tablet Take 4 mg by mouth 4 (four) times daily as needed for diarrhea or loose stools.     melatonin 5 MG TABS Take 1 tablet (5 mg total) by mouth at bedtime as needed (sleep).  0   mirtazapine (REMERON) 7.5 MG tablet Take 1 tablet (7.5 mg total) by mouth at bedtime. 90 tablet 1   nitroGLYCERIN (NITROSTAT) 0.4 MG SL tablet DISSOLVE 1 TABLET UNDER THE TONGUE EVERY 5 MINUTES AS  NEEDED FOR CHEST PAIN. MAX  OF 3 TABLETS IN 15 MINUTES. CALL 911 IF PAIN PERSISTS. (Patient taking differently: Place 0.4 mg under the tongue every 5 (five) minutes as needed for chest pain.) 25 tablet 0   Nutritional Supplements (FEEDING  SUPPLEMENT, NEPRO CARB STEADY,) LIQD Take 237 mLs by mouth 2 (two) times daily between meals. 10000 mL 0   ondansetron (ZOFRAN ODT) 4 MG disintegrating tablet Take 1 tablet (4 mg total) by mouth every 8 (eight) hours as needed for nausea or vomiting. 20 tablet 0   pantoprazole (PROTONIX) 40 MG tablet TAKE 1 TABLET BY MOUTH  DAILY BEFORE BREAKFAST (Patient taking differently: Take 40 mg by mouth daily.) 90 tablet 3   rosuvastatin (CRESTOR) 40 MG tablet Take 40 mg by mouth daily.     sodium bicarbonate 650 MG tablet Take 2 tablets (1,300 mg total) by mouth 2 (two) times daily. 90 tablet 0   sucralfate (CARAFATE) 1 g tablet Take 1 g by mouth 4 (four) times daily -  with meals and at bedtime.     No current facility-administered medications for this visit.    PHYSICAL EXAMINATION: ECOG PERFORMANCE STATUS: 1 - Symptomatic but completely ambulatory  Vitals:   03/30/21 0933  BP: (!) 146/65  Pulse: 84  Resp: 18  Temp: 99 F (37.2 C)  SpO2: 97%   Wt Readings from Last 3 Encounters:  03/30/21 134 lb 11.2 oz (61.1 kg)  12/22/20 144 lb 9.6 oz (65.6 kg)  12/10/20 132 lb 15 oz (60.3 kg)     GENERAL:alert, no distress and comfortable SKIN: skin color, texture, turgor are normal, no rashes or significant lesions EYES: normal, Conjunctiva are pink and non-injected, sclera clear  NECK: supple, thyroid normal size, non-tender, without nodularity LYMPH:  no palpable lymphadenopathy in the cervical, axillary  LUNGS: clear to auscultation and percussion with normal breathing effort HEART: regular rate & rhythm and no murmurs and no lower extremity edema ABDOMEN:abdomen soft, non-tender and normal bowel sounds Musculoskeletal:no cyanosis of digits and no clubbing  NEURO: alert & oriented x 3 with fluent speech, no focal motor/sensory deficits BREAST: 3 x  1.5 cm lump in right LOQ. No palpable mass, nodules or adenopathy bilaterally. Breast exam benign.   LABORATORY DATA:  I have reviewed the data as  listed CBC Latest Ref Rng & Units 03/30/2021 03/02/2021 02/16/2021  WBC 4.0 - 10.5 K/uL 6.4 7.4 8.4  Hemoglobin 12.0 - 15.0 g/dL 11.1(L) 10.9(L) 10.5(L)  Hematocrit 36.0 - 46.0 % 35.4(L) 34.8(L) 33.9(L)  Platelets 150 - 400 K/uL 238 251 240     CMP Latest Ref Rng & Units 12/22/2020 12/10/2020 12/09/2020  Glucose 70 - 99 mg/dL 127(H) 83 120(H)  BUN 8 - 23 mg/dL 49(H) 33(H) 36(H)  Creatinine 0.44 - 1.00 mg/dL 2.66(H) 2.15(H) 2.25(H)  Sodium 135 - 145 mmol/L 143 139 140  Potassium 3.5 - 5.1 mmol/L 3.6 3.5 4.6  Chloride 98 - 111 mmol/L 111 108 108  CO2 22 - 32 mmol/L 23 23 24   Calcium 8.9 - 10.3 mg/dL 9.1 8.5(L) 9.1  Total Protein 6.5 - 8.1 g/dL 6.3(L) - -  Total Bilirubin 0.3 - 1.2 mg/dL 0.4 - -  Alkaline Phos 38 - 126 U/L 79 - -  AST 15 - 41 U/L 63(H) - -  ALT 0 - 44 U/L 60(H) - -      RADIOGRAPHIC STUDIES: I have personally reviewed the radiological images as listed and agreed with the findings in the report. No results found.    Orders Placed This Encounter  Procedures   MM Digital Screening    Standing Status:   Future    Standing Expiration Date:   03/30/2022    Order Specific Question:   Reason for Exam (SYMPTOM  OR DIAGNOSIS REQUIRED)    Answer:   screening    Order Specific Question:   Preferred imaging location?    Answer:   [facility]   All questions were answered. The patient knows to call the clinic with any problems, questions or concerns. No barriers to learning was detected. The total time spent in the appointment was 30 minutes.     Truitt Merle, MD 03/30/2021   I, Wilburn Mylar, am acting as scribe for Truitt Merle, MD.   I have reviewed the above documentation for accuracy and completeness, and I agree with the above.

## 2021-03-30 NOTE — Patient Instructions (Signed)
Vitamin B12 Injection °What is this medication? °Vitamin B12 (VAHY tuh min B12) prevents and treats low vitamin B12 levels in your body. It is used in people who do not get enough vitamin B12 from their diet or when their digestive tract does not absorb enough. Vitamin B12 plays an important role in maintaining the health of your nervous system and red blood cells. °This medicine may be used for other purposes; ask your health care provider or pharmacist if you have questions. °COMMON BRAND NAME(S): B-12 Compliance Kit, B-12 Injection Kit, Cyomin, Dodex, LA-12, Nutri-Twelve, Physicians EZ Use B-12, Primabalt °What should I tell my care team before I take this medication? °They need to know if you have any of these conditions: °Kidney disease °Leber's disease °Megaloblastic anemia °An unusual or allergic reaction to cyanocobalamin, cobalt, other medications, foods, dyes, or preservatives °Pregnant or trying to get pregnant °Breast-feeding °How should I use this medication? °This medication is injected into a muscle or deeply under the skin. It is usually given in a clinic or care team's office. However, your care team may teach you how to inject yourself. Follow all instructions. °Talk to your care team about the use of this medication in children. Special care may be needed. °Overdosage: If you think you have taken too much of this medicine contact a poison control center or emergency room at once. °NOTE: This medicine is only for you. Do not share this medicine with others. °What if I miss a dose? °If you are given your dose at a clinic or care team's office, call to reschedule your appointment. If you give your own injections, and you miss a dose, take it as soon as you can. If it is almost time for your next dose, take only that dose. Do not take double or extra doses. °What may interact with this medication? °Colchicine °Heavy alcohol intake °This list may not describe all possible interactions. Give your health  care provider a list of all the medicines, herbs, non-prescription drugs, or dietary supplements you use. Also tell them if you smoke, drink alcohol, or use illegal drugs. Some items may interact with your medicine. °What should I watch for while using this medication? °Visit your care team regularly. You may need blood work done while you are taking this medication. °You may need to follow a special diet. Talk to your care team. Limit your alcohol intake and avoid smoking to get the best benefit. °What side effects may I notice from receiving this medication? °Side effects that you should report to your care team as soon as possible: °Allergic reactions--skin rash, itching, hives, swelling of the face, lips, tongue, or throat °Swelling of the ankles, hands, or feet °Trouble breathing °Side effects that usually do not require medical attention (report to your care team if they continue or are bothersome): °Diarrhea °This list may not describe all possible side effects. Call your doctor for medical advice about side effects. You may report side effects to FDA at 1-800-FDA-1088. °Where should I keep my medication? °Keep out of the reach of children. °Store at room temperature between 15 and 30 degrees C (59 and 85 degrees F). Protect from light. Throw away any unused medication after the expiration date. °NOTE: This sheet is a summary. It may not cover all possible information. If you have questions about this medicine, talk to your doctor, pharmacist, or health care provider. °© 2022 [employer] (2020-05-18 00:00:00) ° °

## 2021-04-04 ENCOUNTER — Telehealth: Payer: Self-pay | Admitting: Hematology

## 2021-04-04 NOTE — Telephone Encounter (Signed)
Scheduled follow-up appointments per 1/12 los. Patient is aware.

## 2021-04-13 ENCOUNTER — Inpatient Hospital Stay: Payer: Medicare Other

## 2021-04-13 ENCOUNTER — Other Ambulatory Visit: Payer: Self-pay

## 2021-04-13 VITALS — BP 122/63 | HR 80 | Temp 98.4°F | Resp 16

## 2021-04-13 DIAGNOSIS — E538 Deficiency of other specified B group vitamins: Secondary | ICD-10-CM | POA: Diagnosis not present

## 2021-04-13 DIAGNOSIS — D509 Iron deficiency anemia, unspecified: Secondary | ICD-10-CM

## 2021-04-13 DIAGNOSIS — D631 Anemia in chronic kidney disease: Secondary | ICD-10-CM | POA: Diagnosis not present

## 2021-04-13 DIAGNOSIS — N184 Chronic kidney disease, stage 4 (severe): Secondary | ICD-10-CM | POA: Diagnosis not present

## 2021-04-13 DIAGNOSIS — D464 Refractory anemia, unspecified: Secondary | ICD-10-CM

## 2021-04-13 LAB — CBC WITH DIFFERENTIAL (CANCER CENTER ONLY)
Abs Immature Granulocytes: 0.05 10*3/uL (ref 0.00–0.07)
Basophils Absolute: 0 10*3/uL (ref 0.0–0.1)
Basophils Relative: 0 %
Eosinophils Absolute: 0 10*3/uL (ref 0.0–0.5)
Eosinophils Relative: 0 %
HCT: 29.7 % — ABNORMAL LOW (ref 36.0–46.0)
Hemoglobin: 9.2 g/dL — ABNORMAL LOW (ref 12.0–15.0)
Immature Granulocytes: 1 %
Lymphocytes Relative: 12 %
Lymphs Abs: 1.3 10*3/uL (ref 0.7–4.0)
MCH: 25.3 pg — ABNORMAL LOW (ref 26.0–34.0)
MCHC: 31 g/dL (ref 30.0–36.0)
MCV: 81.8 fL (ref 80.0–100.0)
Monocytes Absolute: 0.7 10*3/uL (ref 0.1–1.0)
Monocytes Relative: 7 %
Neutro Abs: 8.6 10*3/uL — ABNORMAL HIGH (ref 1.7–7.7)
Neutrophils Relative %: 80 %
Platelet Count: 214 10*3/uL (ref 150–400)
RBC: 3.63 MIL/uL — ABNORMAL LOW (ref 3.87–5.11)
RDW: 16.4 % — ABNORMAL HIGH (ref 11.5–15.5)
WBC Count: 10.7 10*3/uL — ABNORMAL HIGH (ref 4.0–10.5)
nRBC: 0 % (ref 0.0–0.2)

## 2021-04-13 MED ORDER — EPOETIN ALFA-EPBX 40000 UNIT/ML IJ SOLN
40000.0000 [IU] | Freq: Once | INTRAMUSCULAR | Status: AC
  Administered 2021-04-13: 40000 [IU] via SUBCUTANEOUS
  Filled 2021-04-13: qty 1

## 2021-04-13 NOTE — Patient Instructions (Signed)
Epoetin Alfa injection °What is this medication? °EPOETIN ALFA (e POE e tin AL fa) helps your body make more red blood cells. This medicine is used to treat anemia caused by chronic kidney disease, cancer chemotherapy, or HIV-therapy. It may also be used before surgery if you have anemia. °This medicine may be used for other purposes; ask your health care provider or pharmacist if you have questions. °COMMON BRAND NAME(S): Epogen, Procrit, Retacrit °What should I tell my care team before I take this medication? °They need to know if you have any of these conditions: °cancer °heart disease °high blood pressure °history of blood clots °history of stroke °low levels of folate, iron, or vitamin B12 in the blood °seizures °an unusual or allergic reaction to erythropoietin, albumin, benzyl alcohol, hamster proteins, other medicines, foods, dyes, or preservatives °pregnant or trying to get pregnant °breast-feeding °How should I use this medication? °This medicine is for injection into a vein or under the skin. It is usually given by a health care professional in a hospital or clinic setting. °If you get this medicine at home, you will be taught how to prepare and give this medicine. Use exactly as directed. Take your medicine at regular intervals. Do not take your medicine more often than directed. °It is important that you put your used needles and syringes in a special sharps container. Do not put them in a trash can. If you do not have a sharps container, call your pharmacist or healthcare provider to get one. °A special MedGuide will be given to you by the pharmacist with each prescription and refill. Be sure to read this information carefully each time. °Talk to your pediatrician regarding the use of this medicine in children. While this drug may be prescribed for selected conditions, precautions do apply. °Overdosage: If you think you have taken too much of this medicine contact a poison control center or emergency  room at once. °NOTE: This medicine is only for you. Do not share this medicine with others. °What if I miss a dose? °If you miss a dose, take it as soon as you can. If it is almost time for your next dose, take only that dose. Do not take double or extra doses. °What may interact with this medication? °Interactions have not been studied. °This list may not describe all possible interactions. Give your health care provider a list of all the medicines, herbs, non-prescription drugs, or dietary supplements you use. Also tell them if you smoke, drink alcohol, or use illegal drugs. Some items may interact with your medicine. °What should I watch for while using this medication? °Your condition will be monitored carefully while you are receiving this medicine. °You may need blood work done while you are taking this medicine. °This medicine may cause a decrease in vitamin B6. You should make sure that you get enough vitamin B6 while you are taking this medicine. Discuss the foods you eat and the vitamins you take with your health care professional. °What side effects may I notice from receiving this medication? °Side effects that you should report to your doctor or health care professional as soon as possible: °allergic reactions like skin rash, itching or hives, swelling of the face, lips, or tongue °seizures °signs and symptoms of a blood clot such as breathing problems; changes in vision; chest pain; severe, sudden headache; pain, swelling, warmth in the leg; trouble speaking; sudden numbness or weakness of the face, arm or leg °signs and symptoms of a stroke like   changes in vision; confusion; trouble speaking or understanding; severe headaches; sudden numbness or weakness of the face, arm or leg; trouble walking; dizziness; loss of balance or coordination °Side effects that usually do not require medical attention (report to your doctor or health care professional if they continue or are  bothersome): °chills °cough °dizziness °fever °headaches °joint pain °muscle cramps °muscle pain °nausea, vomiting °pain, redness, or irritation at site where injected °This list may not describe all possible side effects. Call your doctor for medical advice about side effects. You may report side effects to FDA at 1-800-FDA-1088. °Where should I keep my medication? °Keep out of the reach of children. °Store in a refrigerator between 2 and 8 degrees C (36 and 46 degrees F). Do not freeze or shake. Throw away any unused portion if using a single-dose vial. Multi-dose vials can be kept in the refrigerator for up to 21 days after the initial dose. Throw away unused medicine. °NOTE: This sheet is a summary. It may not cover all possible information. If you have questions about this medicine, talk to your doctor, pharmacist, or health care provider. °© 2022 [employer]/Gold Standard (2016-11-06 00:00:00) ° °

## 2021-04-16 ENCOUNTER — Emergency Department (HOSPITAL_COMMUNITY): Payer: Medicare Other

## 2021-04-16 ENCOUNTER — Inpatient Hospital Stay (HOSPITAL_COMMUNITY)
Admission: EM | Admit: 2021-04-16 | Discharge: 2021-04-19 | DRG: 683 | Disposition: A | Discharge status: Home Health | Payer: Medicare Other | Attending: Internal Medicine | Admitting: Internal Medicine

## 2021-04-16 ENCOUNTER — Other Ambulatory Visit: Payer: Self-pay

## 2021-04-16 ENCOUNTER — Encounter (HOSPITAL_COMMUNITY): Payer: Self-pay | Admitting: Emergency Medicine

## 2021-04-16 DIAGNOSIS — Z20822 Contact with and (suspected) exposure to covid-19: Secondary | ICD-10-CM | POA: Diagnosis present

## 2021-04-16 DIAGNOSIS — I2510 Atherosclerotic heart disease of native coronary artery without angina pectoris: Secondary | ICD-10-CM | POA: Diagnosis not present

## 2021-04-16 DIAGNOSIS — N189 Chronic kidney disease, unspecified: Secondary | ICD-10-CM | POA: Diagnosis present

## 2021-04-16 DIAGNOSIS — R251 Tremor, unspecified: Secondary | ICD-10-CM | POA: Diagnosis not present

## 2021-04-16 DIAGNOSIS — Z7951 Long term (current) use of inhaled steroids: Secondary | ICD-10-CM

## 2021-04-16 DIAGNOSIS — N179 Acute kidney failure, unspecified: Principal | ICD-10-CM | POA: Diagnosis present

## 2021-04-16 DIAGNOSIS — Z8616 Personal history of COVID-19: Secondary | ICD-10-CM | POA: Diagnosis not present

## 2021-04-16 DIAGNOSIS — Z803 Family history of malignant neoplasm of breast: Secondary | ICD-10-CM

## 2021-04-16 DIAGNOSIS — E1122 Type 2 diabetes mellitus with diabetic chronic kidney disease: Secondary | ICD-10-CM | POA: Diagnosis present

## 2021-04-16 DIAGNOSIS — G479 Sleep disorder, unspecified: Secondary | ICD-10-CM | POA: Diagnosis not present

## 2021-04-16 DIAGNOSIS — Z90710 Acquired absence of both cervix and uterus: Secondary | ICD-10-CM

## 2021-04-16 DIAGNOSIS — K219 Gastro-esophageal reflux disease without esophagitis: Secondary | ICD-10-CM | POA: Diagnosis not present

## 2021-04-16 DIAGNOSIS — Z8249 Family history of ischemic heart disease and other diseases of the circulatory system: Secondary | ICD-10-CM

## 2021-04-16 DIAGNOSIS — Z833 Family history of diabetes mellitus: Secondary | ICD-10-CM | POA: Diagnosis not present

## 2021-04-16 DIAGNOSIS — Z853 Personal history of malignant neoplasm of breast: Secondary | ICD-10-CM | POA: Diagnosis not present

## 2021-04-16 DIAGNOSIS — E872 Acidosis, unspecified: Secondary | ICD-10-CM | POA: Diagnosis present

## 2021-04-16 DIAGNOSIS — R531 Weakness: Secondary | ICD-10-CM | POA: Diagnosis not present

## 2021-04-16 DIAGNOSIS — D631 Anemia in chronic kidney disease: Secondary | ICD-10-CM | POA: Diagnosis present

## 2021-04-16 DIAGNOSIS — E785 Hyperlipidemia, unspecified: Secondary | ICD-10-CM

## 2021-04-16 DIAGNOSIS — N184 Chronic kidney disease, stage 4 (severe): Secondary | ICD-10-CM

## 2021-04-16 DIAGNOSIS — I130 Hypertensive heart and chronic kidney disease with heart failure and stage 1 through stage 4 chronic kidney disease, or unspecified chronic kidney disease: Secondary | ICD-10-CM | POA: Diagnosis present

## 2021-04-16 DIAGNOSIS — R0602 Shortness of breath: Secondary | ICD-10-CM | POA: Diagnosis not present

## 2021-04-16 DIAGNOSIS — R29818 Other symptoms and signs involving the nervous system: Secondary | ICD-10-CM | POA: Diagnosis not present

## 2021-04-16 DIAGNOSIS — Z955 Presence of coronary angioplasty implant and graft: Secondary | ICD-10-CM

## 2021-04-16 DIAGNOSIS — K7689 Other specified diseases of liver: Secondary | ICD-10-CM | POA: Diagnosis present

## 2021-04-16 DIAGNOSIS — D72829 Elevated white blood cell count, unspecified: Secondary | ICD-10-CM | POA: Diagnosis not present

## 2021-04-16 DIAGNOSIS — I6381 Other cerebral infarction due to occlusion or stenosis of small artery: Secondary | ICD-10-CM | POA: Diagnosis not present

## 2021-04-16 DIAGNOSIS — K529 Noninfective gastroenteritis and colitis, unspecified: Secondary | ICD-10-CM

## 2021-04-16 DIAGNOSIS — Z8673 Personal history of transient ischemic attack (TIA), and cerebral infarction without residual deficits: Secondary | ICD-10-CM

## 2021-04-16 DIAGNOSIS — R9431 Abnormal electrocardiogram [ECG] [EKG]: Secondary | ICD-10-CM | POA: Diagnosis not present

## 2021-04-16 DIAGNOSIS — Z86718 Personal history of other venous thrombosis and embolism: Secondary | ICD-10-CM

## 2021-04-16 DIAGNOSIS — E1169 Type 2 diabetes mellitus with other specified complication: Secondary | ICD-10-CM | POA: Diagnosis not present

## 2021-04-16 DIAGNOSIS — E860 Dehydration: Principal | ICD-10-CM

## 2021-04-16 DIAGNOSIS — R7989 Other specified abnormal findings of blood chemistry: Secondary | ICD-10-CM | POA: Diagnosis not present

## 2021-04-16 DIAGNOSIS — G4700 Insomnia, unspecified: Secondary | ICD-10-CM | POA: Diagnosis not present

## 2021-04-16 DIAGNOSIS — K2970 Gastritis, unspecified, without bleeding: Secondary | ICD-10-CM | POA: Diagnosis not present

## 2021-04-16 DIAGNOSIS — K8689 Other specified diseases of pancreas: Secondary | ICD-10-CM | POA: Diagnosis not present

## 2021-04-16 DIAGNOSIS — Z888 Allergy status to other drugs, medicaments and biological substances status: Secondary | ICD-10-CM

## 2021-04-16 DIAGNOSIS — G8929 Other chronic pain: Secondary | ICD-10-CM | POA: Diagnosis present

## 2021-04-16 DIAGNOSIS — Z9049 Acquired absence of other specified parts of digestive tract: Secondary | ICD-10-CM

## 2021-04-16 DIAGNOSIS — Z885 Allergy status to narcotic agent status: Secondary | ICD-10-CM

## 2021-04-16 DIAGNOSIS — M109 Gout, unspecified: Secondary | ICD-10-CM | POA: Diagnosis present

## 2021-04-16 DIAGNOSIS — Z923 Personal history of irradiation: Secondary | ICD-10-CM

## 2021-04-16 DIAGNOSIS — I5032 Chronic diastolic (congestive) heart failure: Secondary | ICD-10-CM | POA: Diagnosis not present

## 2021-04-16 DIAGNOSIS — Z862 Personal history of diseases of the blood and blood-forming organs and certain disorders involving the immune mechanism: Secondary | ICD-10-CM | POA: Diagnosis present

## 2021-04-16 DIAGNOSIS — E119 Type 2 diabetes mellitus without complications: Secondary | ICD-10-CM | POA: Diagnosis present

## 2021-04-16 DIAGNOSIS — Z79899 Other long term (current) drug therapy: Secondary | ICD-10-CM

## 2021-04-16 HISTORY — DX: Pneumonia, unspecified organism: J18.9

## 2021-04-16 HISTORY — DX: Chronic kidney disease, stage 3 unspecified: N18.30

## 2021-04-16 LAB — RESP PANEL BY RT-PCR (FLU A&B, COVID) ARPGX2
Influenza A by PCR: NEGATIVE
Influenza B by PCR: NEGATIVE
SARS Coronavirus 2 by RT PCR: NEGATIVE

## 2021-04-16 LAB — URINALYSIS, ROUTINE W REFLEX MICROSCOPIC
Bilirubin Urine: NEGATIVE
Glucose, UA: NEGATIVE mg/dL
Hgb urine dipstick: NEGATIVE
Ketones, ur: NEGATIVE mg/dL
Nitrite: NEGATIVE
Protein, ur: 30 mg/dL — AB
Specific Gravity, Urine: 1.015 (ref 1.005–1.030)
pH: 5.5 (ref 5.0–8.0)

## 2021-04-16 LAB — CBC
HCT: 30 % — ABNORMAL LOW (ref 36.0–46.0)
Hemoglobin: 9.2 g/dL — ABNORMAL LOW (ref 12.0–15.0)
MCH: 25.7 pg — ABNORMAL LOW (ref 26.0–34.0)
MCHC: 30.7 g/dL (ref 30.0–36.0)
MCV: 83.8 fL (ref 80.0–100.0)
Platelets: 212 10*3/uL (ref 150–400)
RBC: 3.58 MIL/uL — ABNORMAL LOW (ref 3.87–5.11)
RDW: 17.2 % — ABNORMAL HIGH (ref 11.5–15.5)
WBC: 13.4 10*3/uL — ABNORMAL HIGH (ref 4.0–10.5)
nRBC: 0.9 % — ABNORMAL HIGH (ref 0.0–0.2)

## 2021-04-16 LAB — BASIC METABOLIC PANEL
Anion gap: 9 (ref 5–15)
BUN: 65 mg/dL — ABNORMAL HIGH (ref 8–23)
CO2: 19 mmol/L — ABNORMAL LOW (ref 22–32)
Calcium: 9.3 mg/dL (ref 8.9–10.3)
Chloride: 110 mmol/L (ref 98–111)
Creatinine, Ser: 3.48 mg/dL — ABNORMAL HIGH (ref 0.44–1.00)
GFR, Estimated: 13 mL/min — ABNORMAL LOW (ref >60)
Glucose, Bld: 149 mg/dL — ABNORMAL HIGH (ref 70–99)
Potassium: 4.7 mmol/L (ref 3.5–5.1)
Sodium: 138 mmol/L (ref 135–145)

## 2021-04-16 LAB — URINALYSIS, MICROSCOPIC (REFLEX): RBC / HPF: NONE SEEN RBC/hpf (ref 0–5)

## 2021-04-16 LAB — TROPONIN I (HIGH SENSITIVITY)
Troponin I (High Sensitivity): 16 ng/L (ref <18)
Troponin I (High Sensitivity): 17 ng/L (ref <18)

## 2021-04-16 LAB — BRAIN NATRIURETIC PEPTIDE: B Natriuretic Peptide: 267.9 pg/mL — ABNORMAL HIGH (ref 0.0–100.0)

## 2021-04-16 LAB — CBG MONITORING, ED: Glucose-Capillary: 133 mg/dL — ABNORMAL HIGH (ref 70–99)

## 2021-04-16 MED ORDER — ACETAMINOPHEN 650 MG RE SUPP: 650.0000 mg | Freq: Four times a day (QID) | RECTAL | Status: DC | PRN

## 2021-04-16 MED ORDER — POLYETHYLENE GLYCOL 3350 17 G PO PACK: 17.0000 g | PACK | Freq: Every day | ORAL | Status: DC | PRN

## 2021-04-16 MED ORDER — MELATONIN 5 MG PO TABS: 5.0000 mg | ORAL_TABLET | Freq: Every evening | ORAL | Status: DC | PRN

## 2021-04-16 MED ORDER — PANTOPRAZOLE SODIUM 40 MG PO TBEC
40.0000 mg | DELAYED_RELEASE_TABLET | Freq: Every day | ORAL | Status: DC
  Administered 2021-04-17 – 2021-04-19 (×3): 40 mg via ORAL
  Filled 2021-04-16 (×3): qty 1

## 2021-04-16 MED ORDER — ROSUVASTATIN CALCIUM 20 MG PO TABS: 40.0000 mg | ORAL_TABLET | Freq: Every day | ORAL | Status: DC

## 2021-04-16 MED ORDER — SODIUM CHLORIDE 0.9% FLUSH
3.0000 mL | Freq: Two times a day (BID) | INTRAVENOUS | Status: DC
  Administered 2021-04-17: 3 mL via INTRAVENOUS

## 2021-04-16 MED ORDER — NITROGLYCERIN 0.4 MG SL SUBL: 0.4000 mg | SUBLINGUAL_TABLET | SUBLINGUAL | Status: DC | PRN

## 2021-04-16 MED ORDER — BUDESONIDE 3 MG PO CPEP
3.0000 mg | ORAL_CAPSULE | Freq: Every day | ORAL | Status: DC
  Administered 2021-04-17 – 2021-04-19 (×3): 3 mg via ORAL
  Filled 2021-04-16 (×3): qty 1

## 2021-04-16 MED ORDER — SODIUM CHLORIDE 0.9 % IV BOLUS
500.0000 mL | Freq: Once | INTRAVENOUS | Status: AC
  Administered 2021-04-16: 500 mL via INTRAVENOUS

## 2021-04-16 MED ORDER — NEPRO/CARBSTEADY PO LIQD
237.0000 mL | Freq: Two times a day (BID) | ORAL | Status: DC
  Administered 2021-04-17 – 2021-04-19 (×4): 237 mL via ORAL
  Filled 2021-04-16: qty 237

## 2021-04-16 MED ORDER — APIXABAN 2.5 MG PO TABS
2.5000 mg | ORAL_TABLET | Freq: Two times a day (BID) | ORAL | Status: DC
  Administered 2021-04-17 – 2021-04-19 (×6): 2.5 mg via ORAL
  Filled 2021-04-16 (×6): qty 1

## 2021-04-16 MED ORDER — INSULIN ASPART 100 UNIT/ML IJ SOLN
0.0000 [IU] | Freq: Three times a day (TID) | INTRAMUSCULAR | Status: DC
  Administered 2021-04-17 (×2): 2 [IU] via SUBCUTANEOUS
  Administered 2021-04-18: 1 [IU] via SUBCUTANEOUS

## 2021-04-16 MED ORDER — ACETAMINOPHEN 325 MG PO TABS: 650.0000 mg | ORAL_TABLET | Freq: Four times a day (QID) | ORAL | Status: DC | PRN

## 2021-04-16 MED ORDER — PANCRELIPASE (LIP-PROT-AMYL) 36000-114000 UNITS PO CPEP
36000.0000 [IU] | ORAL_CAPSULE | Freq: Three times a day (TID) | ORAL | Status: DC
  Administered 2021-04-17 – 2021-04-19 (×7): 36000 [IU] via ORAL
  Filled 2021-04-16 (×7): qty 1

## 2021-04-16 MED ORDER — MIRTAZAPINE 15 MG PO TABS
7.5000 mg | ORAL_TABLET | Freq: Every day | ORAL | Status: DC
  Administered 2021-04-17 – 2021-04-18 (×3): 7.5 mg via ORAL
  Filled 2021-04-16 (×3): qty 1

## 2021-04-16 MED ORDER — SODIUM BICARBONATE 650 MG PO TABS
1300.0000 mg | ORAL_TABLET | Freq: Two times a day (BID) | ORAL | Status: DC
  Administered 2021-04-17 – 2021-04-19 (×6): 1300 mg via ORAL
  Filled 2021-04-16 (×6): qty 2

## 2021-04-16 NOTE — ED Provider Notes (Signed)
[facility] Emergency Department Provider Note MRN:  [id number]  Arrival date & time: 04/16/21     Chief Complaint   Weakness   History of Present Illness   Lenor [name] Swamy is a 76 y.o. year-old female with a history of anemia of chronic disease, CKD 4, breast cancer, hypertension, hyperlipidemia, coronary artery disease presenting to the ED with chief complaint of tremors and shortness of breath  The patient is accompanied by her daughter who provides portions of the history.  When asked what brought her to the emergency department the patient states that her daughter made her come.  The patient states that she has had mild shortness of breath over the past week.  During that time she has had shortness of breath when lying flat and when exerting herself.  Patient states that she walks with a walker at home.  The daughter also states that she wanted her to come in for evaluation of bilateral hand tremors.  The daughter states that these hand tremors are new and were worse yesterday evening.  The daughter checked the patient's blood sugar yesterday evening and it was 350.  At that time the patient took her insulin which she had not been taking prior to this.  Her tremors have improved as her blood sugar has decreased.  She denies that she has had fever, chills, productive cough, vomiting, diarrhea, or new rashes.  Also denies numbness or weakness.  Review of Systems  A thorough review of systems was obtained and all systems are negative except as noted in the HPI and PMH.   Patient's Health History    Past Medical History:  Diagnosis Date   Abdominal discomfort    Chronic N/V/D. Presumptive dx Crohn's dx per elevated p ANCA. Failed Entocort and Pentasa. Sep 2003 - ileocolectomy c anastomosis per Dr Deon Pilling 2/2 adhesions - path was hegative for Crohns. EGD, Sm bowel follow through (11/03), and an eteroclysis (10/03) were unrevealing. Cuases hypomag and  hypocalcemia.   Adnexal mass 8/03   s/p lap BSO (R ovarian fibroma) & lysis of adhesions   Allergy    Seasonal   Anemia    Multifactorial. Baseline HgB 10-11 ish. B12 def - 150 in 3/10. Fe Def - ferritin 35 3/10. Both are being repleted.   Breast cancer ([place]) 03/16/13   right, 5 o'clock   CAD (coronary artery disease) 1996   1996 - PTCA and angioplasty diagonal branch. 2000 - Rotoblator & angiopllasty of diagonal. 2006 - subendocardial AMI, DES to proximal LAD.Marland Kitchen Also had 90% stenosis in distal apical LAD. EF 55 with apical hypokinesis. Indefinite ASA and Plavix.   CHF (congestive heart failure) (HCC)    Chronic kidney disease    Chronic renal insuff baseline Cr 1.2 - 1.4 ish.   Chronic kidney disease, stage III (moderate) (HCC)    Chronic renal insuff baseline Cr 1.2 - 1.4 ish. New baseline about 2 (2018)   Chronic pain    CT 10/10 = Spinal stenosis L2 - S1.   COVID-19 11/02/2020   Diabetes mellitus    Insulin dependent   Diabetes mellitus type 2 in obese ([place]) 05/03/2010   Managed on lantus and novolog. Has diabetic nephropathy. Metformin D/C'd 2012 2/2 creatinine.  No diabetic retinopathy per 3/11.    Gout    Hx of radiation therapy 06/02/13- 07/16/13   right rbeast 4500 cGy 25 sessions, right breast boost 1600 cGy in 8 sessions   Hyperlipidemia    Managed with  both a statin and Welchol. Welchol stopped 2014 2/2 cost and started on fenofibrate    Hypertension    2006 B renal arteries patent. 2003 MRA - no RAS. 2003 pheo W/U Dr Hassell Done reportedly negative.   Hypoxia 07/23/2017   Lupus ([place])    Lymphedema of breast    Personal history of radiation therapy 2015   Pneumonia of left lung due to infectious organism    RBBB    Renal vein thrombosis (HCC)    SBO (small bowel obstruction) ([place]) 09/17/2017   Secondary hyperparathyroidism ([place])    Stroke ([place])    Incidental finding MRI 2002 L lacunar infarct   Vitamin B12 deficiency    Vitamin D deficiency    Wears dentures    top     Past Surgical History:  Procedure Laterality Date   ABDOMINAL HYSTERECTOMY     BILATERAL SALPINGOOPHORECTOMY  8/03   Lap BSO (R ovarian fibroma) and adhesion lysis   BIOPSY  09/08/2020   Procedure: BIOPSY;  Surgeon: Ronald Lobo, MD;  Location: [facility];  Service: Endoscopy;;   BOWEL RESECTION  2003   ileocolectomy with anastomosis 2/2 adhesions   BREAST BIOPSY Right 2015   BREAST BIOPSY Right 07/2014   BREAST LUMPECTOMY Right 04/22/2013   BREAST LUMPECTOMY WITH NEEDLE LOCALIZATION AND AXILLARY SENTINEL LYMPH NODE BX Right 04/22/2013   Procedure: BREAST LUMPECTOMY WITH NEEDLE LOCALIZATION AND AXILLARY SENTINEL LYMPH NODE BX;  Surgeon: Stark Klein, MD;  Location: [facility];  Service: General;  Laterality: Right;   CARDIAC CATHETERIZATION     2 stents   CHOLECYSTECTOMY     COLONOSCOPY     ESOPHAGOGASTRODUODENOSCOPY N/A 09/08/2020   Procedure: ESOPHAGOGASTRODUODENOSCOPY (EGD);  Surgeon: Ronald Lobo, MD;  Location: [facility];  Service: Endoscopy;  Laterality: N/A;   HEMICOLECTOMY     R sided hemicolectomy   HERNIA REPAIR     Ventral hernia repair   PTCA  4/06    Family History  Problem Relation Age of Onset   Pancreatic cancer Brother 70   Breast cancer Mother 61   Lung cancer Maternal Aunt    Breast cancer Maternal Aunt    Prostate cancer Maternal Uncle    Heart attack Maternal Grandmother    Breast cancer Maternal Grandmother    Colon cancer Maternal Grandmother    Clotting disorder Maternal Grandmother    Diabetes Maternal Grandmother    Kidney failure Maternal Aunt    Hypertension Daughter     Social History   Socioeconomic History   Marital status: Legally Separated    Spouse name: Not on file   Number of children: 5   Years of education: Not on file   Highest education level: Not on file  Occupational History   Occupation: retired    Fish farm manager: [employer]  Tobacco Use   Smoking status: Never   Smokeless tobacco: Never   Vaping Use   Vaping Use: Never used  Substance and Sexual Activity   Alcohol use: No    Alcohol/week: 0.0 standard drinks   Drug use: No   Sexual activity: Not on file  Other Topics Concern   Not on file  Social History Narrative   She is separated 2 sons 3 daughters   She has grandchildren and great-grandchildren   She is retired   Never smoker no tobacco now no drugs no alcohol no caffeine   Social Determinants of Radio broadcast assistant Strain: Not on file  Food Insecurity: Food Insecurity  Present   Worried About Charity fundraiser in the Last Year: Sometimes true   Ran Out of Food in the Last Year: Sometimes true  Transportation Needs: No Transportation Needs   Lack of Transportation (Medical): No   Lack of Transportation (Non-Medical): No  Physical Activity: Not on file  Stress: Not on file  Social Connections: Not on file  Intimate Partner Violence: Not on file     Physical Exam   Physical Exam Constitutional:      Appearance: She is well-developed. She is not ill-appearing.  HENT:     Head: Normocephalic and atraumatic.     Right Ear: External ear normal.     Left Ear: External ear normal.     Nose: Nose normal.  Eyes:     Comments: Left pupil irregularly shaped   Cardiovascular:     Rate and Rhythm: Normal rate and regular rhythm.     Pulses: Normal pulses.     Heart sounds: Normal heart sounds.  Pulmonary:     Effort: Pulmonary effort is normal. No respiratory distress.     Breath sounds: Normal breath sounds.  Abdominal:     General: Abdomen is flat. There is no distension.     Palpations: Abdomen is soft. There is no mass.     Tenderness: There is no abdominal tenderness.  Musculoskeletal:     Cervical back: Neck supple.  Skin:    General: Skin is warm and dry.  Neurological:     General: No focal deficit present.     Mental Status: She is alert and oriented to person, place, and time.     Cranial Nerves: No cranial nerve deficit.      Sensory: No sensory deficit.     Motor: No weakness.     Comments: Bilateral hand tremor.       Diagnostic and Interventional Summary    EKG Interpretation  Date/Time:  Sunday April 16 2021 15:41:42 EST Ventricular Rate:  77 PR Interval:  124 QRS Duration: 132 QT Interval:  422 QTC Calculation: 477 R Axis:   -27 Text Interpretation: Normal sinus rhythm Right bundle branch block Septal infarct , age undetermined Abnormal ECG When compared with ECG of 02-Dec-2020 00:33, PREVIOUS ECG IS PRESENT No significant change since last tracing Confirmed by Lacretia Leigh ([id number]) on 04/16/2021 5:09:28 PM       Labs Reviewed  BASIC METABOLIC PANEL - Abnormal; Notable for the following components:      Result Value   CO2 19 (*)    Glucose, Bld 149 (*)    BUN 65 (*)    Creatinine, Ser 3.48 (*)    GFR, Estimated 13 (*)    All other components within normal limits  CBC - Abnormal; Notable for the following components:   WBC 13.4 (*)    RBC 3.58 (*)    Hemoglobin 9.2 (*)    HCT 30.0 (*)    MCH 25.7 (*)    RDW 17.2 (*)    nRBC 0.9 (*)    All other components within normal limits  BRAIN NATRIURETIC PEPTIDE - Abnormal; Notable for the following components:   B Natriuretic Peptide 267.9 (*)    All other components within normal limits  URINALYSIS, ROUTINE W REFLEX MICROSCOPIC - Abnormal; Notable for the following components:   APPearance CLOUDY (*)    Protein, ur 30 (*)    Leukocytes,Ua TRACE (*)    All other components within normal limits  URINALYSIS, MICROSCOPIC (REFLEX) -  Abnormal; Notable for the following components:   Bacteria, UA MANY (*)    All other components within normal limits  CBG MONITORING, ED - Abnormal; Notable for the following components:   Glucose-Capillary 133 (*)    All other components within normal limits  RESP PANEL BY RT-PCR (FLU A&B, COVID) ARPGX2  MAGNESIUM  COMPREHENSIVE METABOLIC PANEL  CBC  HEMOGLOBIN A1C  TROPONIN I (HIGH SENSITIVITY)  TROPONIN  I (HIGH SENSITIVITY)    CT Head Wo Contrast  Final Result    DG Chest 2 View  Final Result      Medications  nitroGLYCERIN (NITROSTAT) SL tablet 0.4 mg (has no administration in time range)  rosuvastatin (CRESTOR) tablet 40 mg (has no administration in time range)  pantoprazole (PROTONIX) EC tablet 40 mg (has no administration in time range)  sodium bicarbonate tablet 1,300 mg (has no administration in time range)  apixaban (ELIQUIS) tablet 2.5 mg (has no administration in time range)  feeding supplement (NEPRO CARB STEADY) liquid 237 mL (has no administration in time range)  sodium chloride flush (NS) 0.9 % injection 3 mL (has no administration in time range)  acetaminophen (TYLENOL) tablet 650 mg (has no administration in time range)    Or  acetaminophen (TYLENOL) suppository 650 mg (has no administration in time range)  polyethylene glycol (MIRALAX / GLYCOLAX) packet 17 g (has no administration in time range)  budesonide (ENTOCORT EC) 24 hr capsule 3 mg (has no administration in time range)  lipase/protease/amylase (CREON) capsule 36,000 Units (has no administration in time range)  melatonin tablet 5 mg (has no administration in time range)  mirtazapine (REMERON) tablet 7.5 mg (has no administration in time range)  insulin aspart (novoLOG) injection 0-9 Units (has no administration in time range)  sodium chloride 0.9 % bolus 500 mL (0 mLs Intravenous Stopped 04/16/21 2130)     Procedures  /  Critical Care Procedures  ED Course and Medical Decision Making  Initial Impression and Ddx This is a 76 year old female presents to emergency department for evaluation of tremors and shortness of breath.  Differential diagnosis includes but is not limited to the following: COPD exacerbation, heart failure exacerbation, pneumonia, COVID/flu.  As for the patient's tremors I suspect that these were present in the setting of her transient hyper glycemia given that they have resolved now.  Will  obtain labs noted to further evaluate.  Past medical/surgical history that increases complexity of ED encounter: CHF, CAD, breast cancer  Interpretation of Diagnostics I personally reviewed the EKG, Chest Xray, and Cardiac Monitor and my interpretation is as follows: Chest x-ray was negative for acute cardiopulmonary process.  EKG was normal sinus rhythm without acute ischemic changes.  Patient was noted to be in normal sinus rhythm on the cardiac monitor.  CT head was negative for acute intracranial process.    Lab work was suspicious for a elevated creatinine which likely resulted in an AKI on top of her already elevated CKD.  I discussed the case with Dr. Sheppard Coil who agrees to admit the patient to their service for further management of her likely dehydration versus intrinsic kidney injury.  Patient Reassessment and Ultimate Disposition/Management Admitted to hospital service.  Patient management required discussion with the following services or consulting groups:  Hospitalist Service  Complexity of Problems Addressed Chronic illness with severe exacerbation  Additional Data Reviewed and Analyzed Further history obtained from: Recent PCP notes and Recent Consult notes  Factors Impacting ED Encounter Risk Consideration of hospitalization  Final Clinical Impressions(s) / ED Diagnoses     ICD-10-CM   1. Dehydration  E86.0       ED Discharge Orders     None        Discharge Instructions Discussed with and Provided to Patient:   Discharge Instructions   None       Zachery Dakins, MD 04/16/21 Arnoldo Lenis    Lacretia Leigh, MD 04/18/21 1318

## 2021-04-16 NOTE — ED Notes (Signed)
2 gold tubes sent to lab for save

## 2021-04-16 NOTE — ED Triage Notes (Signed)
C/o generalized weakness, feeling anxious, and mild SOB x 2-3 days.  Denies pain.

## 2021-04-16 NOTE — ED Notes (Signed)
The pt has not felt well  for 2-3 days  she lives with her son and normally walks with a walker no pain anywhere  weaker than usual

## 2021-04-16 NOTE — ED Provider Notes (Signed)
I saw and evaluated the patient, reviewed the resident's note and I agree with the findings and plan.  EKG Interpretation  Date/Time:  Sunday April 16 2021 15:41:42 EST Ventricular Rate:  77 PR Interval:  124 QRS Duration: 132 QT Interval:  422 QTC Calculation: 477 R Axis:   -27 Text Interpretation: Normal sinus rhythm Right bundle branch block Septal infarct , age undetermined Abnormal ECG When compared with ECG of 02-Dec-2020 00:33, PREVIOUS ECG IS PRESENT No significant change since last tracing Confirmed by Lacretia Leigh ([id number]) on 04/16/2021 5:09:28 PM    Patient presented complaint of generalized weakness and shortness of breath x2 to 3 days.  Patient also had worsening tremor but no fever and does have a history of a resting tremor according to her daughter.  Chest x-ray without acute findings here.  Evidence of worsening renal disease.  Will be admitted to the hospitalist   Lacretia Leigh, MD 04/16/21 [phone]

## 2021-04-16 NOTE — H&P (Signed)
History and Physical   [name] EKC:[id number] DOB: 05-09-1945 DOA: 04/16/2021  PCP: Seward Carol, MD   Patient coming from: Home  Chief Complaint: Shortness of breath  HPI: [name] is a 76 y.o. female with medical history significant of diabetes, CKD 4, spinal stenosis, portal vein thrombosis, hypertension, anemia, hyperlipidemia, stroke, GERD, gastritis, gout, CAD status post stent, venous insufficiency, chronic pain, diastolic CHF, breast cancer in 2015, thrombosis on Eliquis who presents with ongoing shortness of breath.  History obtained with assistance of family and chart review.  Patient has headed degree of mild shortness of breath for the past week or so.  This is worse when lying flat and worse on exertion.  She does walk with a walker at home.  She has had some hand tremors that seem to be worsening recently but improved after recent administration of insulin for glucose of around 350, she reportedly had not been taking her insulin much previous to this.  Patient denies fevers, chills, chest pain, abdominal pain, constipation, diarrhea, nausea, vomiting.  ED Course: Vital signs in the ED significant for blood pressure in the 505W 979 systolic.  Lab work-up showed BMP with creatinine elevated to 3.48 from baseline of 2.2, BUN 65, bicarb 18, glucose 149.  CBC with hemoglobin of 9.2 which is stable from 3 days ago but decreased from 11 2 weeks ago.  Leukocytosis to 13.4.  Troponin normal with repeat pending.  BNP pending.  Respiratory panel for flu and COVID-negative.  Chest x-ray showed no acute normality.  CT of the head showed no acute abnormality.  Patient received 500 cc bolus in the ED.  Review of Systems: As per HPI otherwise all other systems reviewed and are negative.  Past Medical History:  Diagnosis Date   Abdominal discomfort    Chronic N/V/D. Presumptive dx Crohn's dx per elevated p ANCA. Failed Entocort and Pentasa. Sep 2003 - ileocolectomy c  anastomosis per Dr Deon Pilling 2/2 adhesions - path was hegative for Crohns. EGD, Sm bowel follow through (11/03), and an eteroclysis (10/03) were unrevealing. Cuases hypomag and hypocalcemia.   Adnexal mass 8/03   s/p lap BSO (R ovarian fibroma) & lysis of adhesions   Allergy    Seasonal   Anemia    Multifactorial. Baseline HgB 10-11 ish. B12 def - 150 in 3/10. Fe Def - ferritin 35 3/10. Both are being repleted.   Breast cancer ([place]) 03/16/13   right, 5 o'clock   CAD (coronary artery disease) 1996   1996 - PTCA and angioplasty diagonal branch. 2000 - Rotoblator & angiopllasty of diagonal. 2006 - subendocardial AMI, DES to proximal LAD.Marland Kitchen Also had 90% stenosis in distal apical LAD. EF 55 with apical hypokinesis. Indefinite ASA and Plavix.   CHF (congestive heart failure) (HCC)    Chronic kidney disease    Chronic renal insuff baseline Cr 1.2 - 1.4 ish.   Chronic kidney disease, stage III (moderate) (HCC)    Chronic renal insuff baseline Cr 1.2 - 1.4 ish. New baseline about 2 (2018)   Chronic pain    CT 10/10 = Spinal stenosis L2 - S1.   COVID-19 11/02/2020   Diabetes mellitus    Insulin dependent   Diabetes mellitus type 2 in obese ([place]) 05/03/2010   Managed on lantus and novolog. Has diabetic nephropathy. Metformin D/C'd 2012 2/2 creatinine.  No diabetic retinopathy per 3/11.    Gout    Hx of radiation therapy 06/02/13- 07/16/13   right rbeast 4500 cGy 25 sessions,  right breast boost 1600 cGy in 8 sessions   Hyperlipidemia    Managed with both a statin and Welchol. Welchol stopped 2014 2/2 cost and started on fenofibrate    Hypertension    2006 B renal arteries patent. 2003 MRA - no RAS. 2003 pheo W/U Dr Hassell Done reportedly negative.   Hypoxia 07/23/2017   Lupus ([place])    Lymphedema of breast    Personal history of radiation therapy 2015   Pneumonia of left lung due to infectious organism    RBBB    Renal vein thrombosis (HCC)    SBO (small bowel obstruction) ([place]) 09/17/2017   Secondary  hyperparathyroidism ([place])    Stroke ([place])    Incidental finding MRI 2002 L lacunar infarct   Vitamin B12 deficiency    Vitamin D deficiency    Wears dentures    top    Past Surgical History:  Procedure Laterality Date   ABDOMINAL HYSTERECTOMY     BILATERAL SALPINGOOPHORECTOMY  8/03   Lap BSO (R ovarian fibroma) and adhesion lysis   BIOPSY  09/08/2020   Procedure: BIOPSY;  Surgeon: Ronald Lobo, MD;  Location: [facility];  Service: Endoscopy;;   BOWEL RESECTION  2003   ileocolectomy with anastomosis 2/2 adhesions   BREAST BIOPSY Right 2015   BREAST BIOPSY Right 07/2014   BREAST LUMPECTOMY Right 04/22/2013   BREAST LUMPECTOMY WITH NEEDLE LOCALIZATION AND AXILLARY SENTINEL LYMPH NODE BX Right 04/22/2013   Procedure: BREAST LUMPECTOMY WITH NEEDLE LOCALIZATION AND AXILLARY SENTINEL LYMPH NODE BX;  Surgeon: Stark Klein, MD;  Location: [facility];  Service: General;  Laterality: Right;   CARDIAC CATHETERIZATION     2 stents   CHOLECYSTECTOMY     COLONOSCOPY     ESOPHAGOGASTRODUODENOSCOPY N/A 09/08/2020   Procedure: ESOPHAGOGASTRODUODENOSCOPY (EGD);  Surgeon: Ronald Lobo, MD;  Location: [facility];  Service: Endoscopy;  Laterality: N/A;   HEMICOLECTOMY     R sided hemicolectomy   HERNIA REPAIR     Ventral hernia repair   PTCA  4/06    Social History  reports that she has never smoked. She has never used smokeless tobacco. She reports that she does not drink alcohol and does not use drugs.  Allergies  Allergen Reactions   Percocet [Oxycodone-Acetaminophen] Other (See Comments)    headaches   Diazepam Other (See Comments)    REACTION: Agitation Other reaction(s): agitation   Haloperidol Lactate Nausea And Vomiting   Lorazepam Nausea Only    Other reaction(s): nausea   Morphine Sulfate Other (See Comments)    REACTION: Agitation   Propoxyphene Hcl Nausea And Vomiting   Tramadol Hcl Nausea And Vomiting   Anesthetics, Halogenated     Per patient's  daughter, she can not tolerate anesthetics.    Etomidate     Heart stopped during anesthesia   Fentanyl     Heart stopped during anesthesia    Haloperidol     Other reaction(s): nausea & vomitin   Morphine Sulfate     Other reaction(s): nausea   Oxycodone-Acetaminophen     Other reaction(s): headaches   Propoxyphene     Other reaction(s): nausea & vomiting   Tramadol Hcl     Other reaction(s): nausea & vomitin Other reaction(s): nausea & vomitin   Versed [Midazolam]     Heart stopped during anesthesia    Family History  Problem Relation Age of Onset   Pancreatic cancer Brother 24   Breast cancer Mother 68   Lung cancer Maternal Aunt  Breast cancer Maternal Aunt    Prostate cancer Maternal Uncle    Heart attack Maternal Grandmother    Breast cancer Maternal Grandmother    Colon cancer Maternal Grandmother    Clotting disorder Maternal Grandmother    Diabetes Maternal Grandmother    Kidney failure Maternal Aunt    Hypertension Daughter   Reviewed on admission   Prior to Admission medications   Medication Sig Start Date End Date Taking? Authorizing Provider  acetaminophen (TYLENOL) 500 MG tablet Take 1,000 mg by mouth every 6 (six) hours as needed for moderate pain or headache.    [provider]  apixaban (ELIQUIS) 2.5 MG TABS tablet Take 1 tablet (2.5 mg total) by mouth 2 (two) times daily. 11/25/20   Ghimire, Henreitta Leber, MD  Artificial Tear Ointment (DRY EYES OP) Place 1 drop into the right eye 2 (two) times daily as needed (dry eye).    [provider]  budesonide (ENTOCORT EC) 3 MG 24 hr capsule Take 1 capsule by mouth once daily Patient taking differently: Take 3 mg by mouth daily. 11/30/20   Gatha Mayer, MD  Cyanocobalamin (B-12 COMPLIANCE INJECTION IJ) Inject 1 vial as directed every 30 (thirty) days.    [provider]  Epoetin Alfa-epbx (RETACRIT IJ) Inject 1 vial as directed every 30 (thirty) days.    [provider]   ergocalciferol (VITAMIN D2) 1.25 MG (50000 UT) capsule Take 1 capsule (50,000 Units total) by mouth once a week. 08/02/20   Truitt Merle, MD  famotidine (PEPCID) 10 MG tablet Take 1 tablet (10 mg total) by mouth at bedtime. 12/10/20   Lavina Hamman, MD  furosemide (LASIX) 20 MG tablet Take 1 tablet (20 mg total) by mouth daily as needed (weight gain of >3Lbs in 1 day or >5 Lbs in 2 days.). 12/10/20   Lavina Hamman, MD  lipase/protease/amylase (CREON) 36000 UNITS CPEP capsule Take 36,000 Units by mouth 3 (three) times daily with meals.    [provider]  loperamide (IMODIUM A-D) 2 MG tablet Take 4 mg by mouth 4 (four) times daily as needed for diarrhea or loose stools.    [provider]  melatonin 5 MG TABS Take 1 tablet (5 mg total) by mouth at bedtime as needed (sleep). 11/19/20   Ghimire, Henreitta Leber, MD  mirtazapine (REMERON) 7.5 MG tablet Take 1 tablet (7.5 mg total) by mouth at bedtime. 12/22/20   Truitt Merle, MD  nitroGLYCERIN (NITROSTAT) 0.4 MG SL tablet DISSOLVE 1 TABLET UNDER THE TONGUE EVERY 5 MINUTES AS  NEEDED FOR CHEST PAIN. MAX  OF 3 TABLETS IN 15 MINUTES. CALL 911 IF PAIN PERSISTS. Patient taking differently: Place 0.4 mg under the tongue every 5 (five) minutes as needed for chest pain. 10/04/20   Jerline Pain, MD  Nutritional Supplements (FEEDING SUPPLEMENT, NEPRO CARB STEADY,) LIQD Take 237 mLs by mouth 2 (two) times daily between meals. 12/10/20   Lavina Hamman, MD  ondansetron (ZOFRAN ODT) 4 MG disintegrating tablet Take 1 tablet (4 mg total) by mouth every 8 (eight) hours as needed for nausea or vomiting. 12/22/20   Truitt Merle, MD  pantoprazole (PROTONIX) 40 MG tablet TAKE 1 TABLET BY MOUTH  DAILY BEFORE BREAKFAST Patient taking differently: Take 40 mg by mouth daily. 10/24/20   Gatha Mayer, MD  rosuvastatin (CRESTOR) 40 MG tablet Take 40 mg by mouth daily. 08/02/20   [provider]  sodium bicarbonate 650 MG tablet Take 2 tablets (1,300 mg  total) by mouth 2  (two) times daily. 12/10/20   Lavina Hamman, MD  sucralfate (CARAFATE) 1 g tablet Take 1 g by mouth 4 (four) times daily -  with meals and at bedtime.    [provider]    Physical Exam: Vitals:   04/16/21 1532 04/16/21 1630 04/16/21 1815  BP: (!) 130/55 (!) 138/50 (!) 149/64  Pulse: 78 79 80  Resp: 16 16 14   Temp: 98.6 F (37 C)    TempSrc: Oral    SpO2: 99% 100% 99%   Physical Exam Constitutional:      General: She is not in acute distress.    Appearance: Normal appearance.  HENT:     Head: Normocephalic and atraumatic.     Mouth/Throat:     Mouth: Mucous membranes are moist.     Pharynx: Oropharynx is clear.  Eyes:     Extraocular Movements: Extraocular movements intact.     Pupils: Pupils are equal, round, and reactive to light.  Cardiovascular:     Rate and Rhythm: Normal rate and regular rhythm.     Pulses: Normal pulses.     Heart sounds: Normal heart sounds.  Pulmonary:     Effort: Pulmonary effort is normal. No respiratory distress.     Breath sounds: Normal breath sounds.  Abdominal:     General: Bowel sounds are normal. There is no distension.     Palpations: Abdomen is soft.     Tenderness: There is no abdominal tenderness.  Musculoskeletal:        General: No swelling or deformity.  Skin:    General: Skin is warm and dry.  Neurological:     General: No focal deficit present.     Mental Status: Mental status is at baseline.   Labs on Admission: I have personally reviewed following labs and imaging studies  CBC: Recent Labs  Lab 04/13/21 1510 04/16/21 1536  WBC 10.7* 13.4*  NEUTROABS 8.6*  --   HGB 9.2* 9.2*  HCT 29.7* 30.0*  MCV 81.8 83.8  PLT 214 616    Basic Metabolic Panel: Recent Labs  Lab 04/16/21 1536  NA 138  K 4.7  CL 110  CO2 19*  GLUCOSE 149*  BUN 65*  CREATININE 3.48*  CALCIUM 9.3    GFR: CrCl cannot be calculated (Unknown ideal weight.).  Liver Function Tests: No results for input(s): AST, ALT, ALKPHOS,  BILITOT, PROT, ALBUMIN in the last 168 hours.  Urine analysis:    Component Value Date/Time   COLORURINE YELLOW 12/02/2020 1316   APPEARANCEUR HAZY (A) 12/02/2020 1316   APPEARANCEUR Clear 08/22/2018 1620   LABSPEC 1.009 12/02/2020 1316   LABSPEC 1.020 03/03/2014 1341   PHURINE 5.0 12/02/2020 1316   GLUCOSEU NEGATIVE 12/02/2020 1316   GLUCOSEU Negative 03/03/2014 1341   HGBUR NEGATIVE 12/02/2020 1316   BILIRUBINUR NEGATIVE 12/02/2020 1316   BILIRUBINUR Negative 08/22/2018 1620   BILIRUBINUR Negative 03/03/2014 1341   KETONESUR NEGATIVE 12/02/2020 1316   PROTEINUR NEGATIVE 12/02/2020 1316   UROBILINOGEN 0.2 03/03/2014 1341   NITRITE NEGATIVE 12/02/2020 1316   LEUKOCYTESUR NEGATIVE 12/02/2020 1316   LEUKOCYTESUR Negative 03/03/2014 1341    Radiological Exams on Admission: DG Chest 2 View  Result Date: 04/16/2021 CLINICAL DATA:  sob EXAM: CHEST - 2 VIEW COMPARISON:  November 15, 2020 FINDINGS: The cardiomediastinal silhouette is unchanged in contour. No pleural effusion. No pneumothorax. No acute pleuroparenchymal abnormality. Visualized abdomen is unremarkable. Surgical clips project over the RIGHT axilla and breast. IMPRESSION:  No acute cardiopulmonary abnormality. Electronically Signed   By: Valentino Saxon M.D.   On: 04/16/2021 16:38   CT Head Wo Contrast  Result Date: 04/16/2021 CLINICAL DATA:  Acute neurologic deficit. Weakness. EXAM: CT HEAD WITHOUT CONTRAST TECHNIQUE: Contiguous axial images were obtained from the base of the skull through the vertex without intravenous contrast. RADIATION DOSE REDUCTION: This exam was performed according to the departmental dose-optimization program which includes automated exposure control, adjustment of the mA and/or kV according to patient size and/or use of iterative reconstruction technique. COMPARISON:  None. FINDINGS: Brain: No evidence of intracranial hemorrhage, acute infarction, hydrocephalus, extra-axial collection, or mass  lesion/mass effect. Mild chronic small vessel disease is again seen. Old left basal ganglia lacunar infarct is stable. Persistent cavum septum pellucidum and cavum vergae incidentally noted. Vascular:  No hyperdense vessel or other acute findings. Skull: No evidence of fracture or other significant bone abnormality. Sinuses/Orbits:  No acute findings. Other: None. IMPRESSION: No acute intracranial abnormality. Stable mild chronic small vessel disease, and old left basal ganglia lacunar infarct. Electronically Signed   By: Marlaine Hind M.D.   On: 04/16/2021 17:47    EKG: Independently reviewed.  Sinus rhythm at 77 bpm.  Right bundle branch block.  T wave flattening to inversion anterior leads.  Similar to previous.  Assessment/Plan Principal Problem:   Acute renal failure superimposed on stage 4 chronic kidney disease (HCC) Active Problems:   GERD without esophagitis   Coronary artery disease involving native coronary artery of native heart   Anemia in chronic kidney disease   History of stroke without residual deficits   Hyperlipidemia associated with type 2 diabetes mellitus (HCC)   Type 2 diabetes mellitus with stage 4 chronic kidney disease, without long-term current use of insulin (HCC)   Sleep disturbance   Leukocytosis   Chronic diarrhea - responds to budesonide   Chronic diastolic CHF (congestive heart failure) (HCC)  AKI on CKD 4 > Creatinine elevated to 3.48 from baseline of 2.2. > Presenting with shortness of breath unclear if holding onto volume from renal failure versus CHF versus other. > Presumed etiology of AKI is prerenal and has received 500 cc in the ED for possible dehydration, however blood pressure is normal to elevated and does have a history of heart failure with BNP still pending.  Possibility of volume overload from mild CHF exacerbation exist however no physical exam findings for this. - Monitor on telemetry - Continue with IV fluids for now unless BNP returns  elevated - Check magnesium - Trend renal function and electrolytes - Avoid nephrotoxic agents - Continue home bicarb  CHF > Known history of diastolic CHF last echo in 2022 showing EF 39-03%, grade 1 diastolic dysfunction and normal RV function. > Right now is on Lasix as needed only for weight gain from volume overload. > BNP obtained in the ED is currently pending. - Continue to monitor for now follow-up BNP  Leukocytosis > Noted to have leukocytosis of 13.4 of unclear etiology in the ED. > Chest x-ray without acute abnormality CT head without acute abnormality no reported fevers or chills normal temperature in the ED. > We will check urinalysis. - Follow-up urinalysis - Trend fever curve and white count  Anemia > Patient presenting with hemoglobin of 9.2 which is stable from 3 days ago but down from around 11 2 weeks ago. > Reported bleeding, could be secondary to progression of renal disease - Continue to trend CBC  Tremors > Had seem to be  worsening recently but improved after treatment of hyperglycemia. > Took insulin recently for glucose in the 350 range and had not been taking much insulin before that. - Continue to monitor - Treat DM as below  Diabetes - SSI  History of VTE - Continue home Eliquis  GERD Gastritis - Continue home budesonide, Creon, PPI  Hyperlipidemia History of CVA CAD s/p DES - Continue home rosuvastatin  Insomnia - Continue home as needed Remeron and melatonin  DVT prophylaxis: Eliquis  Code Status:   Full  Family Communication:  Daughter updated at bedside Disposition Plan:   Patient is from:  Home  Anticipated DC to:  Home  Anticipated DC date:  1-3 days  Anticipated DC barriers: None  Consults called:  None  Admission status:  Observation, telemetry   Severity of Illness: The appropriate patient status for this patient is OBSERVATION. Observation status is judged to be reasonable and necessary in order to provide the required  intensity of service to ensure the patient's safety. The patient's presenting symptoms, physical exam findings, and initial radiographic and laboratory data in the context of their medical condition is felt to place them at decreased risk for further clinical deterioration. Furthermore, it is anticipated that the patient will be medically stable for discharge from the hospital within 2 midnights of admission.    Marcelyn Bruins MD [facility]  How to contact the [facility] Attending or Consulting provider [place] or covering provider during after hours [place], for this patient?   Check the care team in [facility] and look for a) attending/consulting [facility] provider listed and b) the [facility] team listed Log into [email or web address] and use [place]'s universal password to access. If you do not have the password, please contact the hospital operator. Locate the [facility] provider you are looking for under [facility] and page to a number that you can be directly reached. If you still have difficulty reaching the provider, please page the [facility] (Director on Call) for the Hospitalists listed on amion for assistance.  04/16/2021, 7:40 PM

## 2021-04-17 ENCOUNTER — Observation Stay (HOSPITAL_COMMUNITY): Payer: Medicare Other

## 2021-04-17 DIAGNOSIS — E872 Acidosis, unspecified: Secondary | ICD-10-CM | POA: Diagnosis present

## 2021-04-17 DIAGNOSIS — I130 Hypertensive heart and chronic kidney disease with heart failure and stage 1 through stage 4 chronic kidney disease, or unspecified chronic kidney disease: Secondary | ICD-10-CM | POA: Diagnosis present

## 2021-04-17 DIAGNOSIS — D631 Anemia in chronic kidney disease: Secondary | ICD-10-CM | POA: Diagnosis present

## 2021-04-17 DIAGNOSIS — K7689 Other specified diseases of liver: Secondary | ICD-10-CM | POA: Diagnosis present

## 2021-04-17 DIAGNOSIS — E860 Dehydration: Secondary | ICD-10-CM | POA: Diagnosis present

## 2021-04-17 DIAGNOSIS — I2510 Atherosclerotic heart disease of native coronary artery without angina pectoris: Secondary | ICD-10-CM | POA: Diagnosis present

## 2021-04-17 DIAGNOSIS — K8689 Other specified diseases of pancreas: Secondary | ICD-10-CM | POA: Diagnosis not present

## 2021-04-17 DIAGNOSIS — Z90710 Acquired absence of both cervix and uterus: Secondary | ICD-10-CM | POA: Diagnosis not present

## 2021-04-17 DIAGNOSIS — R531 Weakness: Secondary | ICD-10-CM | POA: Diagnosis not present

## 2021-04-17 DIAGNOSIS — E1169 Type 2 diabetes mellitus with other specified complication: Secondary | ICD-10-CM | POA: Diagnosis present

## 2021-04-17 DIAGNOSIS — G4700 Insomnia, unspecified: Secondary | ICD-10-CM | POA: Diagnosis present

## 2021-04-17 DIAGNOSIS — Z833 Family history of diabetes mellitus: Secondary | ICD-10-CM | POA: Diagnosis not present

## 2021-04-17 DIAGNOSIS — N184 Chronic kidney disease, stage 4 (severe): Secondary | ICD-10-CM | POA: Diagnosis present

## 2021-04-17 DIAGNOSIS — R251 Tremor, unspecified: Secondary | ICD-10-CM | POA: Diagnosis present

## 2021-04-17 DIAGNOSIS — N179 Acute kidney failure, unspecified: Secondary | ICD-10-CM | POA: Diagnosis present

## 2021-04-17 DIAGNOSIS — E785 Hyperlipidemia, unspecified: Secondary | ICD-10-CM | POA: Diagnosis present

## 2021-04-17 DIAGNOSIS — Z853 Personal history of malignant neoplasm of breast: Secondary | ICD-10-CM | POA: Diagnosis not present

## 2021-04-17 DIAGNOSIS — E1122 Type 2 diabetes mellitus with diabetic chronic kidney disease: Secondary | ICD-10-CM | POA: Diagnosis present

## 2021-04-17 DIAGNOSIS — Z20822 Contact with and (suspected) exposure to covid-19: Secondary | ICD-10-CM | POA: Diagnosis present

## 2021-04-17 DIAGNOSIS — R7989 Other specified abnormal findings of blood chemistry: Secondary | ICD-10-CM | POA: Diagnosis not present

## 2021-04-17 DIAGNOSIS — M109 Gout, unspecified: Secondary | ICD-10-CM | POA: Diagnosis present

## 2021-04-17 DIAGNOSIS — Z8616 Personal history of COVID-19: Secondary | ICD-10-CM | POA: Diagnosis not present

## 2021-04-17 DIAGNOSIS — Z923 Personal history of irradiation: Secondary | ICD-10-CM | POA: Diagnosis not present

## 2021-04-17 DIAGNOSIS — K2970 Gastritis, unspecified, without bleeding: Secondary | ICD-10-CM | POA: Diagnosis present

## 2021-04-17 DIAGNOSIS — I5032 Chronic diastolic (congestive) heart failure: Secondary | ICD-10-CM | POA: Diagnosis present

## 2021-04-17 DIAGNOSIS — Z8673 Personal history of transient ischemic attack (TIA), and cerebral infarction without residual deficits: Secondary | ICD-10-CM | POA: Diagnosis not present

## 2021-04-17 LAB — COMPREHENSIVE METABOLIC PANEL
ALT: 300 U/L — ABNORMAL HIGH (ref 0–44)
AST: 199 U/L — ABNORMAL HIGH (ref 15–41)
Albumin: 2.5 g/dL — ABNORMAL LOW (ref 3.5–5.0)
Alkaline Phosphatase: 134 U/L — ABNORMAL HIGH (ref 38–126)
Anion gap: 8 (ref 5–15)
BUN: 60 mg/dL — ABNORMAL HIGH (ref 8–23)
CO2: 21 mmol/L — ABNORMAL LOW (ref 22–32)
Calcium: 8.7 mg/dL — ABNORMAL LOW (ref 8.9–10.3)
Chloride: 112 mmol/L — ABNORMAL HIGH (ref 98–111)
Creatinine, Ser: 3.25 mg/dL — ABNORMAL HIGH (ref 0.44–1.00)
GFR, Estimated: 14 mL/min — ABNORMAL LOW (ref >60)
Glucose, Bld: 255 mg/dL — ABNORMAL HIGH (ref 70–99)
Potassium: 4.9 mmol/L (ref 3.5–5.1)
Sodium: 141 mmol/L (ref 135–145)
Total Bilirubin: 0.4 mg/dL (ref 0.3–1.2)
Total Protein: 5.8 g/dL — ABNORMAL LOW (ref 6.5–8.1)

## 2021-04-17 LAB — GLUCOSE, CAPILLARY
Glucose-Capillary: 146 mg/dL — ABNORMAL HIGH (ref 70–99)
Glucose-Capillary: 168 mg/dL — ABNORMAL HIGH (ref 70–99)
Glucose-Capillary: 152 mg/dL — ABNORMAL HIGH (ref 70–99)
Glucose-Capillary: 154 mg/dL — ABNORMAL HIGH (ref 70–99)

## 2021-04-17 LAB — HEMOGLOBIN A1C
Hgb A1c MFr Bld: 6.8 % — ABNORMAL HIGH (ref 4.8–5.6)
Mean Plasma Glucose: 148.46 mg/dL

## 2021-04-17 LAB — CBC
HCT: 27.3 % — ABNORMAL LOW (ref 36.0–46.0)
Hemoglobin: 8.5 g/dL — ABNORMAL LOW (ref 12.0–15.0)
MCH: 26.1 pg (ref 26.0–34.0)
MCHC: 31.1 g/dL (ref 30.0–36.0)
MCV: 83.7 fL (ref 80.0–100.0)
Platelets: 188 10*3/uL (ref 150–400)
RBC: 3.26 MIL/uL — ABNORMAL LOW (ref 3.87–5.11)
RDW: 17.1 % — ABNORMAL HIGH (ref 11.5–15.5)
WBC: 12.9 10*3/uL — ABNORMAL HIGH (ref 4.0–10.5)
nRBC: 0.2 % (ref 0.0–0.2)

## 2021-04-17 LAB — IRON AND TIBC
Iron: 49 ug/dL (ref 28–170)
Saturation Ratios: 15 % (ref 10.4–31.8)
TIBC: 336 ug/dL (ref 250–450)
UIBC: 287 ug/dL

## 2021-04-17 LAB — RETICULOCYTES
Immature Retic Fract: 34.1 % — ABNORMAL HIGH (ref 2.3–15.9)
RBC.: 3.3 MIL/uL — ABNORMAL LOW (ref 3.87–5.11)
Retic Count, Absolute: 47.9 10*3/uL (ref 19.0–186.0)
Retic Ct Pct: 1.5 % (ref 0.4–3.1)

## 2021-04-17 LAB — VITAMIN B12: Vitamin B-12: 1123 pg/mL — ABNORMAL HIGH (ref 180–914)

## 2021-04-17 LAB — HEPATITIS PANEL, ACUTE
HCV Ab: NONREACTIVE
Hep A IgM: NONREACTIVE
Hep B C IgM: NONREACTIVE
Hepatitis B Surface Ag: NONREACTIVE

## 2021-04-17 LAB — CK: Total CK: 104 U/L (ref 38–234)

## 2021-04-17 LAB — MAGNESIUM: Magnesium: 1.4 mg/dL — ABNORMAL LOW (ref 1.7–2.4)

## 2021-04-17 LAB — FERRITIN: Ferritin: 157 ng/mL (ref 11–307)

## 2021-04-17 LAB — CBG MONITORING, ED: Glucose-Capillary: 115 mg/dL — ABNORMAL HIGH (ref 70–99)

## 2021-04-17 LAB — AMMONIA: Ammonia: 33 umol/L (ref 9–35)

## 2021-04-17 LAB — FOLATE: Folate: 13.7 ng/mL (ref >5.9)

## 2021-04-17 MED ORDER — LACTATED RINGERS IV SOLN: INTRAVENOUS | Status: DC

## 2021-04-17 MED ORDER — MAGNESIUM SULFATE 2 GM/50ML IV SOLN
2.0000 g | Freq: Once | INTRAVENOUS | Status: AC
  Administered 2021-04-17: 2 g via INTRAVENOUS
  Filled 2021-04-17: qty 50

## 2021-04-17 NOTE — ED Notes (Signed)
Singh MD at bedside

## 2021-04-17 NOTE — ED Notes (Signed)
Breakfast orders placed 

## 2021-04-17 NOTE — ED Notes (Signed)
Ambulated to restroom with assistance of NT Tonia Ghent

## 2021-04-17 NOTE — Evaluation (Signed)
Occupational Therapy Evaluation Patient Details Name: [name] MRN: [id number] DOB: Oct 13, 1945 Today's Date: 04/17/2021   History of Present Illness Pt is a 76 y/o female admitted secondary to SOB and tremors. Found to have AKI. PMH includes CAD, HTN, CKD, breast cancer, Covid, DM.   Clinical Impression   PTA patient reports independent with ADLs, mobility and light IADLs, family assists with cooking and med mgmt. Pt admitted for above and limited by problem list below, including generalized weakness, decreased safety awareness, and impaired balance.  Pt with decreased recall of situation, recommendations from PT to use RW, and decreased problem solving.  She currently requires min assist for toilet transfers, min guard for mobility in room and supervision for grooming standing.  She will benefit from continued OT services while admitted and after dc at [facility] level to optimize independence, safety and return to PLOF.  Will follow acutely.      Recommendations for follow up therapy are one component of a multi-disciplinary discharge planning process, led by the attending physician.  Recommendations may be updated based on patient status, additional functional criteria and insurance authorization.   Follow Up Recommendations  Home health OT    Assistance Recommended at Discharge Frequent or constant Supervision/Assistance  Patient can return home with the following A little help with walking and/or transfers;A little help with bathing/dressing/bathroom;Assistance with cooking/housework;Direct supervision/assist for medications management;Direct supervision/assist for financial management;Assist for transportation;Help with stairs or ramp for entrance    Functional Status Assessment  Patient has had a recent decline in their functional status and demonstrates the ability to make significant improvements in function in a reasonable and predictable amount of time.  Equipment  Recommendations  BSC/3in1    Recommendations for Other Services       Precautions / Restrictions Precautions Precautions: Fall Restrictions Weight Bearing Restrictions: No      Mobility Bed Mobility               General bed mobility comments: OOB in recliner upon entry    Transfers                          Balance Overall balance assessment: Needs assistance Sitting-balance support: No upper extremity supported, Feet supported Sitting balance-Leahy Scale: Good     Standing balance support: Single extremity supported, During functional activity Standing balance-Leahy Scale: Poor Standing balance comment: Reliant on at least 1 UE support                           ADL either performed or assessed with clinical judgement   ADL Overall ADL's : Needs assistance/impaired     Grooming: Supervision/safety;Standing           Upper Body Dressing : Set up;Sitting   Lower Body Dressing: Minimal assistance;Sit to/from stand   Toilet Transfer: Minimal assistance;Ambulation;Grab bars Toilet Transfer Details (indicate cue type and reason): heavy reliance on grabbar in bathroom for transfer         Functional mobility during ADLs: Minimal assistance General ADL Comments: mild unsteadiness     Vision         Perception     Praxis      Pertinent Vitals/Pain Pain Assessment Pain Assessment: No/denies pain     Hand Dominance Right   Extremity/Trunk Assessment Upper Extremity Assessment Upper Extremity Assessment: Generalized weakness   Lower Extremity Assessment Lower Extremity Assessment: Defer to PT evaluation  Cervical / Trunk Assessment Cervical / Trunk Assessment: Kyphotic   Communication Communication Communication: No difficulties   Cognition Arousal/Alertness: Awake/alert Behavior During Therapy: WFL for tasks assessed/performed Overall Cognitive Status: Impaired/Different from baseline Area of Impairment: Memory,  Safety/judgement, Awareness, Problem solving                     Memory: Decreased short-term memory   Safety/Judgement: Decreased awareness of safety, Decreased awareness of deficits Awareness: Emergent Problem Solving: Slow processing, Requires verbal cues General Comments: pt with decreased recall with PT recommendations to use RW, poor awareness of safety and deficits (even asking son why she is here)     General Comments  son present    Exercises     Shoulder Instructions      Home Living Family/patient expects to be discharged to:: Private residence Living Arrangements: Children Available Help at Discharge: Family;Available PRN/intermittently Type of Home: House Home Access: Level entry     Home Layout: Two level;Able to live on main level with bedroom/bathroom     Bathroom Shower/Tub: Teacher, early years/pre: Standard     Home Equipment: Conservation officer, nature (2 wheels);Rollator (4 wheels)   Additional Comments: Pt's son present and reports pt lives with him      Prior Functioning/Environment Prior Level of Function : Independent/Modified Independent               ADLs Comments: stands to shower, limited IADLs, not driving; pts daughter manages her meds        OT Problem List: Decreased activity tolerance;Impaired balance (sitting and/or standing);Decreased strength;Decreased cognition;Decreased safety awareness;Decreased knowledge of use of DME or AE;Decreased knowledge of precautions      OT Treatment/Interventions: Self-care/ADL training;Therapeutic exercise;DME and/or AE instruction;Therapeutic activities;Patient/family education;Balance training;Cognitive remediation/compensation    OT Goals(Current goals can be found in the care plan section) Acute Rehab OT Goals Patient Stated Goal: home OT Goal Formulation: With patient Time For Goal Achievement: 05/01/21 Potential to Achieve Goals: Good  OT Frequency: Min 2X/week     Co-evaluation              AM-PAC OT "6 Clicks" Daily Activity     Outcome Measure Help from another person eating meals?: A Little Help from another person taking care of personal grooming?: A Little Help from another person toileting, which includes using toliet, bedpan, or urinal?: A Little Help from another person bathing (including washing, rinsing, drying)?: A Little Help from another person to put on and taking off regular upper body clothing?: A Little Help from another person to put on and taking off regular lower body clothing?: A Little 6 Click Score: 18   End of Session Equipment Utilized During Treatment: Gait belt Nurse Communication: Mobility status  Activity Tolerance: Patient tolerated treatment well Patient left: in chair;with call bell/phone within reach;with chair alarm set;with family/visitor present  OT Visit Diagnosis: Other abnormalities of gait and mobility (R26.89);Muscle weakness (generalized) (M62.81)                Time: 6045-4098 OT Time Calculation (min): 13 min Charges:  OT General Charges $OT Visit: 1 Visit OT Evaluation $OT Eval Low Complexity: 1 Low  [name], OT Acute Rehabilitation Services Pager [phone] Office [phone]   [name] 04/17/2021, 12:34 PM

## 2021-04-17 NOTE — TOC Initial Note (Signed)
Transition of Care [facility]) - Initial/Assessment Note    Patient Details  Name: [name] MRN: [id number] Date of Birth: 05-22-1945  Transition of Care [facility]) CM/SW Contact:    Tom-Johnson, Renea Ee, RN Phone Number: 04/17/2021, 3:55 PM  Clinical Narrative:                 CM spoke with patient at bedside about needs for post hospital transition. Son, Deniro at bedside. Admitted for Acute on Chronic Renal Failure. States her son lives with her and she has four adult children. Independent with care prior to hospitalization. Does not drive, children transports and does errands. Has a cane and walker at home. Bedside commode recommended by OT and order placed with [employer], Freda Munro to deliver at bedside. Home health referral made with [facility] and Amy [phone]) voiced acceptance per patient's choice through [email or web address]. PCP is Seward Carol, MD and uses [facility]. CM will continue to follow with needs.  Expected Discharge Plan: [place] Barriers to Discharge: Continued Medical Work up   Patient Goals and CMS Choice Patient states their goals for this hospitalization and ongoing recovery are:: To return home CMS Medicare.gov Compare Post Acute Care list provided to:: Patient Choice offered to / list presented to : Patient, Adult Children (Deniro)  Expected Discharge Plan and Services Expected Discharge Plan: [place]   Discharge Planning Services: CM Consult Post Acute Care Choice: [place] arrangements for the past 2 months: Single Family Home                 DME Arranged: 3-N-1 DME Agency: [facility] Date DME Agency Contacted: 04/17/21 Time DME Agency Contacted: 33 Representative spoke with at DME Agency: Freda Munro HH Arranged: PT [place]: [place] Date [place]: 04/17/21 Time [place]: 20 Representative spoke with at [place]: [place]  Arrangements/Services Living arrangements for the past 2 months: [place] with:: Adult Children (Son, Deniro) Patient language and need for interpreter reviewed:: Yes Do you feel safe going back to the place where you live?: Yes      Need for Family Participation in Patient Care: Yes (Comment) Care giver support system in place?: Yes (comment)   Criminal Activity/Legal Involvement Pertinent to Current Situation/Hospitalization: No - Comment as needed  Activities of Daily Living      Permission Sought/Granted Permission sought to share information with : Case Manager, Customer service manager, Family Supports Permission granted to share information with : Yes, Verbal Permission Granted  Share Information with NAME: Amy  Permission granted to share info w AGENCY: [facility]        Emotional Assessment Appearance:: Appears stated age Attitude/Demeanor/Rapport: Engaged Affect (typically observed): Accepting, Appropriate, Calm, Hopeful Orientation: : Oriented to Self, Oriented to Place, Oriented to  Time, Oriented to Situation Alcohol / Substance Use: Not Applicable Psych Involvement: No (comment)  Admission diagnosis:  Dehydration [E86.0] AKI (acute kidney injury) ([place]) [N17.9] Patient Active Problem List   Diagnosis Date Noted   AKI (acute kidney injury) ([place]) 04/17/2021   Generalized weakness 12/02/2020   (HFpEF) heart failure with preserved ejection fraction ([place]) 12/02/2020   Acute renal failure (ARF) ([place]) 12/02/2020   Elevated troponin 11/15/2020   CKD (chronic kidney disease), stage IV ([place]) 11/15/2020   Unintentional weight loss 11/15/2020   Malnutrition of moderate degree 09/27/2020   Transaminitis 09/23/2020   Acute renal failure superimposed on stage 4 chronic kidney  disease ([place]) 09/03/2020   Chronic diastolic CHF (congestive heart failure) ([place]) 09/03/2020   Dyspnea 06/28/2020   Chronic diarrhea - responds to budesonide 09/10/2019   Iron  deficiency anemia 08/19/2018   Spondylosis of lumbar spine 12/24/2017   Renal vein thrombosis ([place]) 09/17/2017   Hoarse voice quality 09/09/2017   Low back pain without sciatica 09/09/2017   Sleep disturbance    Leukocytosis    Essential hypertension    Urinary frequency    Type 2 diabetes mellitus with stage 4 chronic kidney disease, without long-term current use of insulin ([place])    Benign essential HTN    Dysphagia    Debility    Encephalopathy    Pressure injury of skin 08/04/2017   Diffuse pulmonary alveolar hemorrhage    SOB (shortness of breath) 10/11/2016   Hyperparathyroidism, secondary renal ([place]) 05/02/2016   Gout 04/11/2015   Diabetic neuropathy associated with type 2 diabetes mellitus ([place]) 01/06/2015   SBO (small bowel obstruction) ([place]) 09/15/2013   Breast cancer of lower-inner quadrant of right female breast ([place]) 03/18/2013   Chronic venous insufficiency 01/06/2013   Health care maintenance 05/08/2011   Seasonal allergies 05/03/2010   Labile hypertension    Coronary artery disease involving native coronary artery of native heart    Abdominal discomfort    Anemia in chronic kidney disease    History of stroke without residual deficits    Hyperlipidemia associated with type 2 diabetes mellitus ([place])    Chronic pain    Adnexal mass    GERD without esophagitis 01/19/2009   Spinal stenosis, lumbar region, with neurogenic claudication 01/03/2009   PCP:  Seward Carol, MD Pharmacy:   [facility] ([place], [place] [facility] [address] [place] [address] [place] [address] Phone: [phone] Fax: [phone]  [place] ([place]), [place] - [address] [place]) [place] [address] Phone: [phone] Fax: [place]), [place] - [address] [place]) [place] [address] Phone: [phone] Fax:  [phone]     Social Determinants of Health (SDOH) Interventions    Readmission Risk Interventions Readmission Risk Prevention Plan 09/04/2020  Transportation Screening Complete  Medication Review (RN Care Manager) Complete  HRI or [place] Complete  SW Recovery Care/Counseling Consult Complete  Palliative Care Screening Not [place] Not Applicable  Some recent data might be hidden

## 2021-04-17 NOTE — Evaluation (Signed)
Clinical/Bedside Swallow Evaluation Patient Details  Name: [name] MRN: [id number] Date of Birth: [birth year]  Today's Date: 04/17/2021 Time: SLP Start Time (ACUTE ONLY): 1330 SLP Stop Time (ACUTE ONLY): 1350 SLP Time Calculation (min) (ACUTE ONLY): 20 min  Past Medical History:  Past Medical History:  Diagnosis Date   Abdominal discomfort    Chronic N/V/D. Presumptive dx Crohn's dx per elevated p ANCA. Failed Entocort and Pentasa. Sep 2003 - ileocolectomy c anastomosis per Dr Deon Pilling 2/2 adhesions - path was hegative for Crohns. EGD, Sm bowel follow through (11/03), and an eteroclysis (10/03) were unrevealing. Cuases hypomag and hypocalcemia.   Adnexal mass 8/03   s/p lap BSO (R ovarian fibroma) & lysis of adhesions   Allergy    Seasonal   Anemia    Multifactorial. Baseline HgB 10-11 ish. B12 def - 150 in 3/10. Fe Def - ferritin 35 3/10. Both are being repleted.   Breast cancer ([place]) 03/16/13   right, 5 o'clock   CAD (coronary artery disease) 1996   1996 - PTCA and angioplasty diagonal branch. 2000 - Rotoblator & angiopllasty of diagonal. 2006 - subendocardial AMI, DES to proximal LAD.Marland Kitchen Also had 90% stenosis in distal apical LAD. EF 55 with apical hypokinesis. Indefinite ASA and Plavix.   CHF (congestive heart failure) (HCC)    Chronic kidney disease    Chronic renal insuff baseline Cr 1.2 - 1.4 ish.   Chronic kidney disease, stage III (moderate) (HCC)    Chronic renal insuff baseline Cr 1.2 - 1.4 ish. New baseline about 2 (2018)   Chronic pain    CT 10/10 = Spinal stenosis L2 - S1.   COVID-19 11/02/2020   Diabetes mellitus    Insulin dependent   Diabetes mellitus type 2 in obese ([place]) 05/03/2010   Managed on lantus and novolog. Has diabetic nephropathy. Metformin D/C'd 2012 2/2 creatinine.  No diabetic retinopathy per 3/11.    Gout    Hx of radiation therapy 06/02/13- 07/16/13   right rbeast 4500 cGy 25 sessions, right breast boost 1600 cGy in 8 sessions   Hyperlipidemia     Managed with both a statin and Welchol. Welchol stopped 2014 2/2 cost and started on fenofibrate    Hypertension    2006 B renal arteries patent. 2003 MRA - no RAS. 2003 pheo W/U Dr Hassell Done reportedly negative.   Hypoxia 07/23/2017   Lupus ([place])    Lymphedema of breast    Personal history of radiation therapy 2015   Pneumonia of left lung due to infectious organism    RBBB    Renal vein thrombosis (HCC)    SBO (small bowel obstruction) ([place]) 09/17/2017   Secondary hyperparathyroidism ([place])    Stroke ([place])    Incidental finding MRI 2002 L lacunar infarct   Vitamin B12 deficiency    Vitamin D deficiency    Wears dentures    top   Past Surgical History:  Past Surgical History:  Procedure Laterality Date   ABDOMINAL HYSTERECTOMY     BILATERAL SALPINGOOPHORECTOMY  8/03   Lap BSO (R ovarian fibroma) and adhesion lysis   BIOPSY  09/08/2020   Procedure: BIOPSY;  Surgeon: Ronald Lobo, MD;  Location: [facility];  Service: Endoscopy;;   BOWEL RESECTION  2003   ileocolectomy with anastomosis 2/2 adhesions   BREAST BIOPSY Right 2015   BREAST BIOPSY Right 07/2014   BREAST LUMPECTOMY Right 04/22/2013   BREAST LUMPECTOMY WITH NEEDLE LOCALIZATION AND AXILLARY SENTINEL LYMPH NODE BX Right 04/22/2013   Procedure:  BREAST LUMPECTOMY WITH NEEDLE LOCALIZATION AND AXILLARY SENTINEL LYMPH NODE BX;  Surgeon: Stark Klein, MD;  Location: [facility];  Service: General;  Laterality: Right;   CARDIAC CATHETERIZATION     2 stents   CHOLECYSTECTOMY     COLONOSCOPY     ESOPHAGOGASTRODUODENOSCOPY N/A 09/08/2020   Procedure: ESOPHAGOGASTRODUODENOSCOPY (EGD);  Surgeon: Ronald Lobo, MD;  [facility];  Service: Endoscopy;  Laterality: N/A;   HEMICOLECTOMY     R sided hemicolectomy   HERNIA REPAIR     Ventral hernia repair   PTCA  4/06   HPI:  76yo female admitted 04/16/21 with SOB. PMH: DM, CKD4, spinal stenosis, portal vein thrombosis, HTN, anemia, HLD, CVA, GERD, gastritis,  gout, CAD s/p stent, venous insufficiency, chronic pain, dCHF, breast cancer (2015), thrombosis. CXR negative.    Assessment / Plan / Recommendation  Clinical Impression  Pt seen at bedside for assessment of swallow function and safety. Pt reports no history of dysphagia. CN exam is unremarkable. She has upper and lower dentures. Pt accepted trials of thin liquid, puree, and solid textures. Timely oral prep and clearing. No overt s/s aspiration observed on any texture. Recommend continuing with current (regular/thin) diet. No further ST intervention recommended at this time.  SLP Visit Diagnosis: Dysphagia, unspecified (R13.10)    Aspiration Risk  Mild aspiration risk    Diet Recommendation Regular;Thin liquid   Liquid Administration via: Cup;Straw Medication Administration: Whole meds with liquid Supervision: Patient able to self feed Compensations: Small sips/bites;Slow rate Postural Changes: Seated upright at 90 degrees;Remain upright for at least 30 minutes after po intake    Other  Recommendations Oral Care Recommendations: Oral care BID    Recommendations for follow up therapy are one component of a multi-disciplinary discharge planning process, led by the attending physician.  Recommendations may be updated based on patient status, additional functional criteria and insurance authorization.  Follow up Recommendations No SLP follow up      Assistance Recommended at Discharge None  Functional Status Assessment Patient has not had a recent decline in their functional status      Prognosis  Good with continued recovery     Swallow Study   General Date of Onset: 04/16/21 HPI: 77yo female admitted 04/16/21 with SOB. PMH: DM, CKD4, spinal stenosis, portal vein thrombosis, HTN, anemia, HLD, CVA, GERD, gastritis, gout, CAD s/p stent, venous insufficiency, chronic pain, dCHF, breast cancer (2015), thrombosis. CXR negative. Type of Study: Bedside Swallow Evaluation Previous Swallow  Assessment: MBS 09/22/19 - no aspiration, transient penetration and delayed oral phase intermittently Diet Prior to this Study: Regular;Thin liquids Respiratory Status: Room air History of Recent Intubation: No Behavior/Cognition: Alert;Cooperative;Pleasant mood Oral Cavity Assessment: Within Functional Limits Oral Care Completed by SLP: No Oral Cavity - Dentition: Dentures, top;Dentures, bottom Vision: Functional for self-feeding Self-Feeding Abilities: Able to feed self Patient Positioning: Upright in chair Baseline Vocal Quality: Normal Volitional Cough: Strong Volitional Swallow: Able to elicit    Oral/Motor/Sensory Function Overall Oral Motor/Sensory Function: Within functional limits   Ice Chips Ice chips: Not tested   Thin Liquid Thin Liquid: Within functional limits Presentation: Straw    Nectar Thick Nectar Thick Liquid: Not tested   Honey Thick Honey Thick Liquid: Not tested   Puree Puree: Within functional limits Presentation: Spoon;Self Fed   Solid     Solid: Within functional limits Presentation: Self Fed;Spoon     Shriyan Arakawa B. Quentin Ore, [facility], [place] Speech Language Pathologist Office: [phone]  Shonna Chock 04/17/2021,1:56 PM

## 2021-04-17 NOTE — ED Notes (Signed)
CBG is 115.

## 2021-04-17 NOTE — Evaluation (Signed)
Physical Therapy Evaluation Patient Details Name: [name] MRN: [id number] DOB: Jun 01, 1945 Today's Date: 04/17/2021  History of Present Illness  Pt is a 76 y/o female admitted secondary to SOB and tremors. Found to have AKI. PMH includes CAD, HTN, CKD, breast cancer, Covid, DM.  Clinical Impression  Pt admitted secondary to problem above with deficits below. Pt requiring min guard to min A for mobility tasks today. Mild unsteadiness and weakness noted throughout. Educated about using RW at home and to avoid stair navigation to increase safety at home. Recommending HHPT at d/c. Will continue to follow acutely.        Recommendations for follow up therapy are one component of a multi-disciplinary discharge planning process, led by the attending physician.  Recommendations may be updated based on patient status, additional functional criteria and insurance authorization.  Follow Up Recommendations Home health PT    Assistance Recommended at Discharge Frequent or constant Supervision/Assistance (initially)  Patient can return home with the following  A little help with walking and/or transfers;A little help with bathing/dressing/bathroom    Equipment Recommendations None recommended by PT  Recommendations for Other Services       Functional Status Assessment Patient has had a recent decline in their functional status and demonstrates the ability to make significant improvements in function in a reasonable and predictable amount of time.     Precautions / Restrictions Precautions Precautions: Fall Restrictions Weight Bearing Restrictions: No      Mobility  Bed Mobility Overal bed mobility: Needs Assistance Bed Mobility: Supine to Sit     Supine to sit: Min assist     General bed mobility comments: Min A for trunk assist to come to sitting.    Transfers Overall transfer level: Needs assistance Equipment used: 1 person hand held assist Transfers: Sit to/from  Stand Sit to Stand: Min guard           General transfer comment: Min guard for safety    Ambulation/Gait Ambulation/Gait assistance: Min guard, Min assist Gait Distance (Feet): 40 Feet Assistive device: 1 person hand held assist Gait Pattern/deviations: Step-through pattern, Decreased stride length Gait velocity: Decreased     General Gait Details: Increased weakness noted in RLE>LLE. Mild buckling noted in RLE. Min guard to min A for steadying. Educated about using RW at home to increase safety.  Stairs            Wheelchair Mobility    Modified Rankin (Stroke Patients Only)       Balance Overall balance assessment: Needs assistance Sitting-balance support: No upper extremity supported, Feet supported Sitting balance-Leahy Scale: Good     Standing balance support: Single extremity supported Standing balance-Leahy Scale: Poor Standing balance comment: Reliant on at least 1 UE support                             Pertinent Vitals/Pain Pain Assessment Pain Assessment: No/denies pain    Home Living Family/patient expects to be discharged to:: Private residence Living Arrangements: Children Available Help at Discharge: Family;Available PRN/intermittently Type of Home: House Home Access: Level entry       Home Layout: Two level;Able to live on main level with bedroom/bathroom Home Equipment: Rolling Walker (2 wheels);Rollator (4 wheels) Additional Comments: Pt's son present and reports pt lives with him    Prior Function Prior Level of Function : Independent/Modified Independent  Hand Dominance   Dominant Hand: Right    Extremity/Trunk Assessment   Upper Extremity Assessment Upper Extremity Assessment: Defer to OT evaluation    Lower Extremity Assessment Lower Extremity Assessment: Generalized weakness    Cervical / Trunk Assessment Cervical / Trunk Assessment: Kyphotic  Communication   Communication:  No difficulties  Cognition Arousal/Alertness: Awake/alert Behavior During Therapy: WFL for tasks assessed/performed Overall Cognitive Status: Impaired/Different from baseline Area of Impairment: Memory                     Memory: Decreased short-term memory         General Comments: Memory deficits noted throughout        General Comments General comments (skin integrity, edema, etc.): Pt's son present throughout    Exercises     Assessment/Plan    PT Assessment Patient needs continued PT services  PT Problem List Decreased strength;Decreased balance;Decreased activity tolerance;Decreased mobility;Decreased cognition;Decreased safety awareness;Decreased knowledge of use of DME       PT Treatment Interventions DME instruction;Gait training;Functional mobility training;Therapeutic activities;Therapeutic exercise;Balance training;Patient/family education    PT Goals (Current goals can be found in the Care Plan section)  Acute Rehab PT Goals Patient Stated Goal: to go home PT Goal Formulation: With patient Time For Goal Achievement: 05/01/21 Potential to Achieve Goals: Good    Frequency Min 3X/week     Co-evaluation               AM-PAC PT "6 Clicks" Mobility  Outcome Measure Help needed turning from your back to your side while in a flat bed without using bedrails?: None Help needed moving from lying on your back to sitting on the side of a flat bed without using bedrails?: A Little Help needed moving to and from a bed to a chair (including a wheelchair)?: A Little Help needed standing up from a chair using your arms (e.g., wheelchair or bedside chair)?: A Little Help needed to walk in hospital room?: A Little Help needed climbing 3-5 steps with a railing? : A Lot 6 Click Score: 18    End of Session Equipment Utilized During Treatment: Gait belt Activity Tolerance: Patient tolerated treatment well Patient left: in chair;with call bell/phone within  reach;with chair alarm set;with family/visitor present Nurse Communication: Mobility status PT Visit Diagnosis: Unsteadiness on feet (R26.81);Muscle weakness (generalized) (M62.81)    Time: 3734-2876 PT Time Calculation (min) (ACUTE ONLY): 18 min   Charges:   PT Evaluation $PT Eval Low Complexity: 1 Low          Lou Miner, DPT  Acute Rehabilitation Services  Pager: [phone] Office: [phone]   Rudean Hitt 04/17/2021, 9:32 AM

## 2021-04-17 NOTE — Progress Notes (Signed)
PROGRESS NOTE                                                                                                                                                                                                             Patient Demographics:    [name], is a 76 y.o. female, DOB - 1946-01-04, LTJ:[id number]  Outpatient Primary MD for the patient is Seward Carol, MD    LOS - 0  Admit date - 04/16/2021    Chief Complaint  Patient presents with   Weakness       Brief Narrative (HPI from H&P)   76 y.o. female with medical history significant of diabetes, CKD 4, spinal stenosis, portal vein thrombosis, hypertension, anemia, hyperlipidemia, stroke, GERD, gastritis, gout, CAD status post stent, venous insufficiency, chronic pain, diastolic CHF, breast cancer in 2015, thrombosis on Eliquis who presents with lysed weakness that started at home, was brought to the ER where she was found to have  AKI on CKD 4, dehydration and was admitted to the hospital   Subjective:    [name] today has, No headache, No chest pain, No abdominal pain - No Nausea, No new weakness tingling or numbness, mild SOB.   Assessment  & Plan :    No notes have been filed under this hospital service. Service: Hospitalist    1.  Generalized weakness, transaminitis, AKI and dehydration.  Likely precipitated by recent addition of statin to her medical regimen, she seems to have generalized body aches and LFTs have gone up.  We will check CK, acute hepatitis panel, unremarkable abdominal ultrasound, hydrate with IV fluids hold statin and monitor.  Initiate PT OT and advance activity.  2.  8 mm incidental liver cyst.  PCP to monitor outpatient.  3.  Dyslipidemia.  Possible statin allergy.  Hold statin and monitor.  4.  AKI on CKD 4.  Hydrate and monitor.  Ultrasound of the abdomen nonacute.  5.  Chronic diastolic CHF on echocardiogram done in 2022 with  a EF of 60%.  Currently dehydrated hydrate with IV fluids  6.  Nonspecific reactionary leukocytosis.  Afebrile, UA chest x-ray stable.  Monitor.  7.  Normocytic anemia.  Check anemia panel.   8.  History of DVT.  Continue Eliquis.   9.  History of CVA in the past and mild tremors.  On Eliquis, statin held due to #1 above.  PT OT.  10. Insomnia - Continue home as needed Remeron and melatonin  11. DM type II.  Sliding scale insulin.  Lab Results  Component Value Date   HGBA1C 6.8 (H) 04/17/2021   CBG (last 3)  Recent Labs    04/16/21 1646 04/17/21 0727 04/17/21 0909  GLUCAP 133* 115* 146*         Condition - Fair  Family Communication  :  daughter [name] [phone] on 04/17/21,   Son [name] [phone] - 04/17/21  Code Status :  Full  Consults  :  None  PUD Prophylaxis :  PPI   Procedures  :      CT -  No acute intracranial abnormality. Stable mild chronic small vessel disease, and old left basal ganglia lacunar infarct.  Abd [place] - 1. Normal appearing liver with an 8 mm incidental cyst noted. 2. Bilateral simple renal cysts with bilateral normal renal echotexture without hydronephrosis. 3. Mildly dilated pancreatic duct. No corresponding abnormality on the recent CT other than mild diffuse pancreatic atrophy. This is of doubtful clinical significance.      Disposition Plan  :    Status is: Observation  DVT Prophylaxis  :    apixaban (ELIQUIS) tablet 2.5 mg Start: 04/16/21 2359 apixaban (ELIQUIS) tablet 2.5 mg   Lab Results  Component Value Date   PLT 188 04/17/2021    Diet :  Diet Order             Diet heart healthy/carb modified Room service appropriate? Yes; Fluid consistency: Thin  Diet effective now                    Inpatient Medications  Scheduled Meds:  apixaban  2.5 mg Oral BID   budesonide  3 mg Oral Daily   feeding supplement (NEPRO CARB STEADY)  237 mL Oral BID BM   insulin aspart  0-9 Units Subcutaneous TID WC    lipase/protease/amylase  36,000 Units Oral TID WC   mirtazapine  7.5 mg Oral QHS   pantoprazole  40 mg Oral Daily   sodium bicarbonate  1,300 mg Oral BID   sodium chloride flush  3 mL Intravenous Q12H   Continuous Infusions:  lactated ringers     magnesium sulfate bolus IVPB     PRN Meds:.acetaminophen **OR** acetaminophen, melatonin, nitroGLYCERIN, polyethylene glycol  Antibiotics  :    Anti-infectives (From admission, onward)    None        Time Spent in minutes  30   Lala Lund M.D on 04/17/2021 at 9:44 AM  To page go to [email or web address]   [facility] -  Office  [phone]  See all Orders from today for further details    Objective:   Vitals:   04/17/21 0700 04/17/21 0800 04/17/21 0802 04/17/21 0906  BP: (!) 158/60 (!) 148/65  (!) 152/62  Pulse: 85 80 80 85  Resp: 17 16 16 19   Temp:   98.5 F (36.9 C) 98.6 F (37 C)  TempSrc:    Oral  SpO2: 99% 97% 97% 100%  Weight:      Height:        Wt Readings from Last 3 Encounters:  04/17/21 61.1 kg  03/30/21 61.1 kg  12/22/20 65.6 kg    No intake or output data in the 24 hours ending 04/17/21 0944   Physical Exam  Awake Alert x 2, No new F.N deficits,   [place].AT,PERRAL  Supple Neck, No JVD,   Symmetrical Chest wall movement, Good air movement bilaterally, CTAB RRR,No Gallops,Rubs or new Murmurs,  +ve B.Sounds, Abd Soft, No tenderness,   No Cyanosis, Clubbing or edema      Data Review:    CBC Recent Labs  Lab 04/13/21 1510 04/16/21 1536 04/17/21 0151  WBC 10.7* 13.4* 12.9*  HGB 9.2* 9.2* 8.5*  HCT 29.7* 30.0* 27.3*  PLT 214 212 188  MCV 81.8 83.8 83.7  MCH 25.3* 25.7* 26.1  MCHC 31.0 30.7 31.1  RDW 16.4* 17.2* 17.1*  LYMPHSABS 1.3  --   --   MONOABS 0.7  --   --   EOSABS 0.0  --   --   BASOSABS 0.0  --   --     Electrolytes Recent Labs  Lab 04/16/21 1536 04/16/21 1832 04/17/21 0151 04/17/21 0645  NA 138  --  141  --   K 4.7  --  4.9  --   CL 110  --  112*  --   CO2 19*   --  21*  --   GLUCOSE 149*  --  255*  --   BUN 65*  --  60*  --   CREATININE 3.48*  --  3.25*  --   CALCIUM 9.3  --  8.7*  --   AST  --   --  199*  --   ALT  --   --  300*  --   ALKPHOS  --   --  134*  --   BILITOT  --   --  0.4  --   ALBUMIN  --   --  2.5*  --   MG  --   --   --  1.4*  HGBA1C  --   --  6.8*  --   AMMONIA  --   --   --  33  BNP  --  267.9*  --   --     ------------------------------------------------------------------------------------------------------------------ No results for input(s): CHOL, HDL, LDLCALC, TRIG, CHOLHDL, LDLDIRECT in the last 72 hours.  Lab Results  Component Value Date   HGBA1C 6.8 (H) 04/17/2021    No results for input(s): TSH, T4TOTAL, T3FREE, THYROIDAB in the last 72 hours.  Invalid input(s): FREET3 ------------------------------------------------------------------------------------------------------------------ ID Labs Recent Labs  Lab 04/13/21 1510 04/16/21 1536 04/17/21 0151  WBC 10.7* 13.4* 12.9*  PLT 214 212 188  CREATININE  --  3.48* 3.25*   Cardiac Enzymes No results for input(s): CKMB, TROPONINI, MYOGLOBIN in the last 168 hours.  Invalid input(s): CK  Radiology Reports DG Chest 2 View  Result Date: 04/16/2021 CLINICAL DATA:  sob EXAM: CHEST - 2 VIEW COMPARISON:  November 15, 2020 FINDINGS: The cardiomediastinal silhouette is unchanged in contour. No pleural effusion. No pneumothorax. No acute pleuroparenchymal abnormality. Visualized abdomen is unremarkable. Surgical clips project over the RIGHT axilla and breast. IMPRESSION: No acute cardiopulmonary abnormality. Electronically Signed   By: Valentino Saxon M.D.   On: 04/16/2021 16:38   CT Head Wo Contrast  Result Date: 04/16/2021 CLINICAL DATA:  Acute neurologic deficit. Weakness. EXAM: CT HEAD WITHOUT CONTRAST TECHNIQUE: Contiguous axial images were obtained from the base of the skull through the vertex without intravenous contrast. RADIATION DOSE REDUCTION: This  exam was performed according to the departmental dose-optimization program which includes automated exposure control, adjustment of the mA and/or kV according to patient size and/or use of iterative reconstruction technique. COMPARISON:  None. FINDINGS: Brain: No evidence of intracranial hemorrhage, acute infarction, hydrocephalus,  extra-axial collection, or mass lesion/mass effect. Mild chronic small vessel disease is again seen. Old left basal ganglia lacunar infarct is stable. Persistent cavum septum pellucidum and cavum vergae incidentally noted. Vascular:  No hyperdense vessel or other acute findings. Skull: No evidence of fracture or other significant bone abnormality. Sinuses/Orbits:  No acute findings. Other: None. IMPRESSION: No acute intracranial abnormality. Stable mild chronic small vessel disease, and old left basal ganglia lacunar infarct. Electronically Signed   By: Marlaine Hind M.D.   On: 04/16/2021 17:47   US Abdomen Complete  Result Date: 04/17/2021 CLINICAL DATA:  Acute kidney injury.  Elevated liver function tests. EXAM: ABDOMEN ULTRASOUND COMPLETE COMPARISON:  Abdomen and pelvis CT dated 12/02/2020 FINDINGS: Gallbladder: Surgically absent. Common bile duct: Diameter: 6.5 mm Liver: 8 mm simple cyst in the left lobe. Normal echotexture. Portal vein is patent on color Doppler imaging with normal direction of blood flow towards the liver. IVC: No abnormality visualized. Pancreas: Mildly dilated pancreatic duct measuring 3.4 mm in maximum diameter. No visible stone or mass. Spleen: Size and appearance within normal limits. Right Kidney: Length: 9.1 cm. Multiple simple cysts. Normal echotexture. No hydronephrosis. Left Kidney: Length: 10.9 cm. Multiple simple cysts. Normal echotexture. No hydronephrosis. Abdominal aorta: No aneurysm visualized. Other findings: None. IMPRESSION: 1. Normal appearing liver with an 8 mm incidental cyst noted. 2. Bilateral simple renal cysts with bilateral normal renal  echotexture without hydronephrosis. 3. Mildly dilated pancreatic duct. No corresponding abnormality on the recent CT other than mild diffuse pancreatic atrophy. This is of doubtful clinical significance. Electronically Signed   By: Claudie Revering M.D.   On: 04/17/2021 09:19

## 2021-04-18 LAB — COMPREHENSIVE METABOLIC PANEL
ALT: 203 U/L — ABNORMAL HIGH (ref 0–44)
AST: 70 U/L — ABNORMAL HIGH (ref 15–41)
Albumin: 2.5 g/dL — ABNORMAL LOW (ref 3.5–5.0)
Alkaline Phosphatase: 133 U/L — ABNORMAL HIGH (ref 38–126)
Anion gap: 8 (ref 5–15)
BUN: 59 mg/dL — ABNORMAL HIGH (ref 8–23)
CO2: 24 mmol/L (ref 22–32)
Calcium: 9.3 mg/dL (ref 8.9–10.3)
Chloride: 109 mmol/L (ref 98–111)
Creatinine, Ser: 2.64 mg/dL — ABNORMAL HIGH (ref 0.44–1.00)
GFR, Estimated: 19 mL/min — ABNORMAL LOW (ref >60)
Glucose, Bld: 93 mg/dL (ref 70–99)
Potassium: 4.8 mmol/L (ref 3.5–5.1)
Sodium: 141 mmol/L (ref 135–145)
Total Bilirubin: 0.5 mg/dL (ref 0.3–1.2)
Total Protein: 6 g/dL — ABNORMAL LOW (ref 6.5–8.1)

## 2021-04-18 LAB — CBC WITH DIFFERENTIAL/PLATELET
Abs Immature Granulocytes: 0.11 10*3/uL — ABNORMAL HIGH (ref 0.00–0.07)
Basophils Absolute: 0 10*3/uL (ref 0.0–0.1)
Basophils Relative: 0 %
Eosinophils Absolute: 0.1 10*3/uL (ref 0.0–0.5)
Eosinophils Relative: 1 %
HCT: 26.7 % — ABNORMAL LOW (ref 36.0–46.0)
Hemoglobin: 8.6 g/dL — ABNORMAL LOW (ref 12.0–15.0)
Immature Granulocytes: 1 %
Lymphocytes Relative: 13 %
Lymphs Abs: 1.5 10*3/uL (ref 0.7–4.0)
MCH: 26.1 pg (ref 26.0–34.0)
MCHC: 32.2 g/dL (ref 30.0–36.0)
MCV: 81.2 fL (ref 80.0–100.0)
Monocytes Absolute: 0.9 10*3/uL (ref 0.1–1.0)
Monocytes Relative: 7 %
Neutro Abs: 9.6 10*3/uL — ABNORMAL HIGH (ref 1.7–7.7)
Neutrophils Relative %: 78 %
Platelets: 193 10*3/uL (ref 150–400)
RBC: 3.29 MIL/uL — ABNORMAL LOW (ref 3.87–5.11)
RDW: 17.2 % — ABNORMAL HIGH (ref 11.5–15.5)
WBC: 12.2 10*3/uL — ABNORMAL HIGH (ref 4.0–10.5)
nRBC: 0.2 % (ref 0.0–0.2)

## 2021-04-18 LAB — BRAIN NATRIURETIC PEPTIDE: B Natriuretic Peptide: 187.7 pg/mL — ABNORMAL HIGH (ref 0.0–100.0)

## 2021-04-18 LAB — GLUCOSE, CAPILLARY
Glucose-Capillary: 84 mg/dL (ref 70–99)
Glucose-Capillary: 115 mg/dL — ABNORMAL HIGH (ref 70–99)
Glucose-Capillary: 126 mg/dL — ABNORMAL HIGH (ref 70–99)
Glucose-Capillary: 152 mg/dL — ABNORMAL HIGH (ref 70–99)

## 2021-04-18 LAB — MAGNESIUM: Magnesium: 1.7 mg/dL (ref 1.7–2.4)

## 2021-04-18 MED ORDER — MAGNESIUM SULFATE IN D5W 1-5 GM/100ML-% IV SOLN
1.0000 g | Freq: Once | INTRAVENOUS | Status: AC
  Administered 2021-04-18: 1 g via INTRAVENOUS
  Filled 2021-04-18 (×2): qty 100

## 2021-04-18 MED ORDER — LACTATED RINGERS IV SOLN: INTRAVENOUS | Status: AC

## 2021-04-18 MED ORDER — LOPERAMIDE HCL 2 MG PO CAPS
4.0000 mg | ORAL_CAPSULE | Freq: Four times a day (QID) | ORAL | Status: DC | PRN
  Administered 2021-04-18: 4 mg via ORAL
  Filled 2021-04-18: qty 2

## 2021-04-18 NOTE — Plan of Care (Signed)
  Problem: Clinical Measurements: Goal: Respiratory complications will improve Outcome: Progressing   Problem: Activity: Goal: Risk for activity intolerance will decrease Outcome: Progressing   

## 2021-04-18 NOTE — Discharge Instructions (Addendum)
Follow with Primary MD Seward Carol, MD in 7 days   Get CBC, CMP, 2 view Chest X ray -  checked next visit within 1 week by Primary MD    Activity: As tolerated with Full fall precautions use walker/cane & assistance as needed  Disposition Home    Diet: Heart Healthy Low Carb  Special Instructions: If you have smoked or chewed Tobacco  in the last 2 yrs please stop smoking, stop any regular Alcohol  and or any Recreational drug use.  On your next visit with your primary care physician please Get Medicines reviewed and adjusted.  Please request your Prim.MD to go over all Hospital Tests and Procedure/Radiological results at the follow up, please get all Hospital records sent to your Prim MD by signing hospital release before you go home.  If you experience worsening of your admission symptoms, develop shortness of breath, life threatening emergency, suicidal or homicidal thoughts you must seek medical attention immediately by calling 911 or calling your MD immediately  if symptoms less severe.  You Must read complete instructions/literature along with all the possible adverse reactions/side effects for all the Medicines you take and that have been prescribed to you. Take any new Medicines after you have completely understood and accpet all the possible adverse reactions/side effects.   Information on my medicine - ELIQUIS (apixaban)  This medication education was reviewed with me or my healthcare representative as part of my discharge preparation.  The pharmacist that spoke with me during my hospital stay was:    Why was Eliquis prescribed for you? Eliquis was prescribed for you to reduce the risk of a blood clot forming that can cause a stroke if you have a medical condition called atrial fibrillation (a type of irregular heartbeat).  What do You need to know about Eliquis ? Take your Eliquis TWICE DAILY - one tablet in the morning and one tablet in the evening with or without  food. If you have difficulty swallowing the tablet whole please discuss with your pharmacist how to take the medication safely.  Take Eliquis exactly as prescribed by your doctor and DO NOT stop taking Eliquis without talking to the doctor who prescribed the medication.  Stopping may increase your risk of developing a stroke.  Refill your prescription before you run out.  After discharge, you should have regular check-up appointments with your healthcare provider that is prescribing your Eliquis.  In the future your dose may need to be changed if your kidney function or weight changes by a significant amount or as you get older.  What do you do if you miss a dose? If you miss a dose, take it as soon as you remember on the same day and resume taking twice daily.  Do not take more than one dose of ELIQUIS at the same time to make up a missed dose.  Important Safety Information A possible side effect of Eliquis is bleeding. You should call your healthcare provider right away if you experience any of the following: Bleeding from an injury or your nose that does not stop. Unusual colored urine (red or dark brown) or unusual colored stools (red or black). Unusual bruising for unknown reasons. A serious fall or if you hit your head (even if there is no bleeding).  Some medicines may interact with Eliquis and might increase your risk of bleeding or clotting while on Eliquis. To help avoid this, consult your healthcare provider or pharmacist prior to using any new prescription  or non-prescription medications, including herbals, vitamins, non-steroidal anti-inflammatory drugs (NSAIDs) and supplements.  This website has more information on Eliquis (apixaban): http://www.eliquis.com/eliquis/home

## 2021-04-18 NOTE — Progress Notes (Signed)
°   04/18/21 1350  Clinical Encounter Type  Visited With Patient  Visit Type Initial  Consult/Referral To None   Chaplain visited patient during time on 36M. The patient, [name] is in good spirits looking forward to returning home and visiting with her grandchildren. She misses her family greatly.   [place] Resident [facility] [phone]

## 2021-04-18 NOTE — Progress Notes (Signed)
PROGRESS NOTE                                                                                                                                                                                                             Patient Demographics:    [name], is a 76 y.o. female, DOB - 1945/11/25, GYF:[id number]  Outpatient Primary MD for the patient is Seward Carol, MD    LOS - 1  Admit date - 04/16/2021    Chief Complaint  Patient presents with   Weakness       Brief Narrative (HPI from H&P)   76 y.o. female with medical history significant of diabetes, CKD 4, spinal stenosis, portal vein thrombosis, hypertension, anemia, hyperlipidemia, stroke, GERD, gastritis, gout, CAD status post stent, venous insufficiency, chronic pain, diastolic CHF, breast cancer in 2015, thrombosis on Eliquis who presents with lysed weakness that started at home, was brought to the ER where she was found to have  AKI on CKD 4, dehydration and was admitted to the hospital   Subjective:   Patient in bed, appears comfortable, denies any headache, no fever, no chest pain or pressure, no shortness of breath , no abdominal pain. No new focal weakness.    Assessment  & Plan :    No notes have been filed under this hospital service. Service: Hospitalist    1.  Generalized weakness, transaminitis, AKI and dehydration.  Likely precipitated by recent addition of statin to her medical regimen, she seems to have generalized body aches and LFTs have gone up.  Stable CK, -ve acute hepatitis panel, unremarkable abdominal ultrasound, continue hydration with IV fluids hold statin and monitor.  Initiate PT OT and advance activity.  2.  8 mm incidental liver cyst.  PCP to monitor outpatient.  3.  Dyslipidemia.  Possible statin allergy.  Hold statin and monitor.  4.  AKI on CKD 4.  Improving with hydration, continue to hydrate with IVF and monitor.  Ultrasound of  the abdomen nonacute.  5.  Chronic diastolic CHF on echocardiogram done in 2022 with a EF of 60%.  Currently dehydrated hydrate with IV fluids  6.  Nonspecific reactionary leukocytosis.  Afebrile, UA chest x-ray stable.  Monitor.  7.  Normocytic anemia. Stable anemia panel.   8.  History of DVT.  Continue Eliquis.   9.  History of CVA in the past and mild tremors.  On Eliquis, statin held due to #1 above.  PT OT.  10. Insomnia - Continue home as needed Remeron and melatonin  11. DM type II.  Sliding scale insulin.  Lab Results  Component Value Date   HGBA1C 6.8 (H) 04/17/2021   CBG (last 3)  Recent Labs    04/17/21 1634 04/17/21 2145 04/18/21 0721  GLUCAP 152* 154* 84         Condition - Fair  Family Communication  :    daughter [name] [phone] on 04/17/21, 04/18/21  Leodis Rains [phone] - 04/17/21, 04/18/21 at 10:06 AM full mailbox  Code Status :  Full  Consults  :  None  PUD Prophylaxis :  PPI   Procedures  :      CT -  No acute intracranial abnormality. Stable mild chronic small vessel disease, and old left basal ganglia lacunar infarct.  Abd [place] - 1. Normal appearing liver with an 8 mm incidental cyst noted. 2. Bilateral simple renal cysts with bilateral normal renal echotexture without hydronephrosis. 3. Mildly dilated pancreatic duct. No corresponding abnormality on the recent CT other than mild diffuse pancreatic atrophy. This is of doubtful clinical significance.      Disposition Plan  :    Status is: Observation  DVT Prophylaxis  :    apixaban (ELIQUIS) tablet 2.5 mg Start: 04/16/21 2359 apixaban (ELIQUIS) tablet 2.5 mg   Lab Results  Component Value Date   PLT 193 04/18/2021    Diet :  Diet Order             Diet heart healthy/carb modified Room service appropriate? Yes; Fluid consistency: Thin  Diet effective now                    Inpatient Medications  Scheduled Meds:  apixaban  2.5 mg Oral BID   budesonide  3 mg Oral  Daily   feeding supplement (NEPRO CARB STEADY)  237 mL Oral BID BM   insulin aspart  0-9 Units Subcutaneous TID WC   lipase/protease/amylase  36,000 Units Oral TID WC   mirtazapine  7.5 mg Oral QHS   pantoprazole  40 mg Oral Daily   sodium bicarbonate  1,300 mg Oral BID   sodium chloride flush  3 mL Intravenous Q12H   Continuous Infusions:  lactated ringers 75 mL/hr at 04/18/21 0233   magnesium sulfate bolus IVPB     PRN Meds:.acetaminophen **OR** acetaminophen, melatonin, nitroGLYCERIN, polyethylene glycol  Antibiotics  :    Anti-infectives (From admission, onward)    None        Time Spent in minutes  30   Lala Lund M.D on 04/18/2021 at 10:05 AM  To page go to [email or web address]   [facility] -  Office  [phone]  See all Orders from today for further details    Objective:   Vitals:   04/17/21 1632 04/17/21 2128 04/18/21 0435 04/18/21 0837  BP: (!) 155/56 (!) 162/73 (!) 155/69 (!) 147/62  Pulse: 86 85 84 83  Resp: 19 16 18 18   Temp: 98.3 F (36.8 C) 98.6 F (37 C) 98.7 F (37.1 C) 98.5 F (36.9 C)  TempSrc: Oral Oral Oral Oral  SpO2: 100% 100% 100% 100%  Weight:   71.1 kg   Height:        Wt Readings from Last 3 Encounters:  04/18/21 71.1 kg  03/30/21 61.1 kg  12/22/20 65.6 kg  Intake/Output Summary (Last 24 hours) at 04/18/2021 1005 Last data filed at 04/18/2021 0800 Gross per 24 hour  Intake 2394.67 ml  Output 0 ml  Net 2394.67 ml     Physical Exam  Awake Alert x2, No new F.N deficits, Normal affect [place].AT,PERRAL Supple Neck, No JVD,   Symmetrical Chest wall movement, Good air movement bilaterally, CTAB RRR,No Gallops, Rubs or new Murmurs,  +ve B.Sounds, Abd Soft, No tenderness,   No Cyanosis, Clubbing or edema       Data Review:    CBC Recent Labs  Lab 04/13/21 1510 04/16/21 1536 04/17/21 0151 04/18/21 0536  WBC 10.7* 13.4* 12.9* 12.2*  HGB 9.2* 9.2* 8.5* 8.6*  HCT 29.7* 30.0* 27.3* 26.7*  PLT 214 212 188 193   MCV 81.8 83.8 83.7 81.2  MCH 25.3* 25.7* 26.1 26.1  MCHC 31.0 30.7 31.1 32.2  RDW 16.4* 17.2* 17.1* 17.2*  LYMPHSABS 1.3  --   --  1.5  MONOABS 0.7  --   --  0.9  EOSABS 0.0  --   --  0.1  BASOSABS 0.0  --   --  0.0    Electrolytes Recent Labs  Lab 04/16/21 1536 04/16/21 1832 04/17/21 0151 04/17/21 0645 04/18/21 0536  NA 138  --  141  --  141  K 4.7  --  4.9  --  4.8  CL 110  --  112*  --  109  CO2 19*  --  21*  --  24  GLUCOSE 149*  --  255*  --  93  BUN 65*  --  60*  --  59*  CREATININE 3.48*  --  3.25*  --  2.64*  CALCIUM 9.3  --  8.7*  --  9.3  AST  --   --  199*  --  70*  ALT  --   --  300*  --  203*  ALKPHOS  --   --  134*  --  133*  BILITOT  --   --  0.4  --  0.5  ALBUMIN  --   --  2.5*  --  2.5*  MG  --   --   --  1.4* 1.7  HGBA1C  --   --  6.8*  --   --   AMMONIA  --   --   --  33  --   BNP  --  267.9*  --   --  187.7*    ------------------------------------------------------------------------------------------------------------------ No results for input(s): CHOL, HDL, LDLCALC, TRIG, CHOLHDL, LDLDIRECT in the last 72 hours.  Lab Results  Component Value Date   HGBA1C 6.8 (H) 04/17/2021    No results for input(s): TSH, T4TOTAL, T3FREE, THYROIDAB in the last 72 hours.  Invalid input(s): FREET3 ------------------------------------------------------------------------------------------------------------------ ID Labs Recent Labs  Lab 04/13/21 1510 04/16/21 1536 04/17/21 0151 04/18/21 0536  WBC 10.7* 13.4* 12.9* 12.2*  PLT 214 212 188 193  CREATININE  --  3.48* 3.25* 2.64*   Cardiac Enzymes No results for input(s): CKMB, TROPONINI, MYOGLOBIN in the last 168 hours.  Invalid input(s): CK  Radiology Reports DG Chest 2 View  Result Date: 04/16/2021 CLINICAL DATA:  sob EXAM: CHEST - 2 VIEW COMPARISON:  November 15, 2020 FINDINGS: The cardiomediastinal silhouette is unchanged in contour. No pleural effusion. No pneumothorax. No acute pleuroparenchymal  abnormality. Visualized abdomen is unremarkable. Surgical clips project over the RIGHT axilla and breast. IMPRESSION: No acute cardiopulmonary abnormality. Electronically Signed   By: Valentino Saxon M.D.  On: 04/16/2021 16:38   CT Head Wo Contrast  Result Date: 04/16/2021 CLINICAL DATA:  Acute neurologic deficit. Weakness. EXAM: CT HEAD WITHOUT CONTRAST TECHNIQUE: Contiguous axial images were obtained from the base of the skull through the vertex without intravenous contrast. RADIATION DOSE REDUCTION: This exam was performed according to the departmental dose-optimization program which includes automated exposure control, adjustment of the mA and/or kV according to patient size and/or use of iterative reconstruction technique. COMPARISON:  None. FINDINGS: Brain: No evidence of intracranial hemorrhage, acute infarction, hydrocephalus, extra-axial collection, or mass lesion/mass effect. Mild chronic small vessel disease is again seen. Old left basal ganglia lacunar infarct is stable. Persistent cavum septum pellucidum and cavum vergae incidentally noted. Vascular:  No hyperdense vessel or other acute findings. Skull: No evidence of fracture or other significant bone abnormality. Sinuses/Orbits:  No acute findings. Other: None. IMPRESSION: No acute intracranial abnormality. Stable mild chronic small vessel disease, and old left basal ganglia lacunar infarct. Electronically Signed   By: Marlaine Hind M.D.   On: 04/16/2021 17:47   US Abdomen Complete  Result Date: 04/17/2021 CLINICAL DATA:  Acute kidney injury.  Elevated liver function tests. EXAM: ABDOMEN ULTRASOUND COMPLETE COMPARISON:  Abdomen and pelvis CT dated 12/02/2020 FINDINGS: Gallbladder: Surgically absent. Common bile duct: Diameter: 6.5 mm Liver: 8 mm simple cyst in the left lobe. Normal echotexture. Portal vein is patent on color Doppler imaging with normal direction of blood flow towards the liver. IVC: No abnormality visualized. Pancreas:  Mildly dilated pancreatic duct measuring 3.4 mm in maximum diameter. No visible stone or mass. Spleen: Size and appearance within normal limits. Right Kidney: Length: 9.1 cm. Multiple simple cysts. Normal echotexture. No hydronephrosis. Left Kidney: Length: 10.9 cm. Multiple simple cysts. Normal echotexture. No hydronephrosis. Abdominal aorta: No aneurysm visualized. Other findings: None. IMPRESSION: 1. Normal appearing liver with an 8 mm incidental cyst noted. 2. Bilateral simple renal cysts with bilateral normal renal echotexture without hydronephrosis. 3. Mildly dilated pancreatic duct. No corresponding abnormality on the recent CT other than mild diffuse pancreatic atrophy. This is of doubtful clinical significance. Electronically Signed   By: Claudie Revering M.D.   On: 04/17/2021 09:19

## 2021-04-19 LAB — CBC WITH DIFFERENTIAL/PLATELET
Abs Immature Granulocytes: 0.12 10*3/uL — ABNORMAL HIGH (ref 0.00–0.07)
Basophils Absolute: 0 10*3/uL (ref 0.0–0.1)
Basophils Relative: 0 %
Eosinophils Absolute: 0.1 10*3/uL (ref 0.0–0.5)
Eosinophils Relative: 1 %
HCT: 27.6 % — ABNORMAL LOW (ref 36.0–46.0)
Hemoglobin: 8.6 g/dL — ABNORMAL LOW (ref 12.0–15.0)
Immature Granulocytes: 1 %
Lymphocytes Relative: 19 %
Lymphs Abs: 2 10*3/uL (ref 0.7–4.0)
MCH: 25.5 pg — ABNORMAL LOW (ref 26.0–34.0)
MCHC: 31.2 g/dL (ref 30.0–36.0)
MCV: 81.9 fL (ref 80.0–100.0)
Monocytes Absolute: 0.8 10*3/uL (ref 0.1–1.0)
Monocytes Relative: 8 %
Neutro Abs: 7.8 10*3/uL — ABNORMAL HIGH (ref 1.7–7.7)
Neutrophils Relative %: 71 %
Platelets: 193 10*3/uL (ref 150–400)
RBC: 3.37 MIL/uL — ABNORMAL LOW (ref 3.87–5.11)
RDW: 17.4 % — ABNORMAL HIGH (ref 11.5–15.5)
WBC: 10.9 10*3/uL — ABNORMAL HIGH (ref 4.0–10.5)
nRBC: 0 % (ref 0.0–0.2)

## 2021-04-19 LAB — COMPREHENSIVE METABOLIC PANEL
ALT: 153 U/L — ABNORMAL HIGH (ref 0–44)
AST: 40 U/L (ref 15–41)
Albumin: 2.6 g/dL — ABNORMAL LOW (ref 3.5–5.0)
Alkaline Phosphatase: 140 U/L — ABNORMAL HIGH (ref 38–126)
Anion gap: 8 (ref 5–15)
BUN: 53 mg/dL — ABNORMAL HIGH (ref 8–23)
CO2: 24 mmol/L (ref 22–32)
Calcium: 9.3 mg/dL (ref 8.9–10.3)
Chloride: 107 mmol/L (ref 98–111)
Creatinine, Ser: 2.56 mg/dL — ABNORMAL HIGH (ref 0.44–1.00)
GFR, Estimated: 19 mL/min — ABNORMAL LOW (ref >60)
Glucose, Bld: 123 mg/dL — ABNORMAL HIGH (ref 70–99)
Potassium: 4.8 mmol/L (ref 3.5–5.1)
Sodium: 139 mmol/L (ref 135–145)
Total Bilirubin: 0.4 mg/dL (ref 0.3–1.2)
Total Protein: 6 g/dL — ABNORMAL LOW (ref 6.5–8.1)

## 2021-04-19 LAB — GLUCOSE, CAPILLARY: Glucose-Capillary: 79 mg/dL (ref 70–99)

## 2021-04-19 LAB — BRAIN NATRIURETIC PEPTIDE: B Natriuretic Peptide: 773.8 pg/mL — ABNORMAL HIGH (ref 0.0–100.0)

## 2021-04-19 LAB — MAGNESIUM: Magnesium: 1.8 mg/dL (ref 1.7–2.4)

## 2021-04-19 NOTE — Discharge Summary (Signed)
[name] GNF:[id number] DOB: February 04, 1946 DOA: 04/16/2021  PCP: Seward Carol, MD  Admit date: 04/16/2021  Discharge date: 04/19/2021  Admitted From: Home   Disposition:  Home   Recommendations for Outpatient Follow-up:   Follow up with PCP in 1-2 weeks  PCP Please obtain BMP/CBC, 2 view CXR in 1week,  (see Discharge instructions)   PCP Please follow up on the following pending results: Monitor LFTs closely, discontinued present statin likely statin allergic.   Home Health: PT-OT   Equipment/Devices: walker  Consultations: None  Discharge Condition: Stable    CODE STATUS: Full    Diet Recommendation: Heart Healthy Low Carb    Chief Complaint  Patient presents with   Weakness     Brief history of present illness from the day of admission and additional interim summary    76 y.o. female with medical history significant of diabetes, CKD 4, spinal stenosis, portal vein thrombosis, hypertension, anemia, hyperlipidemia, stroke, GERD, gastritis, gout, CAD status post stent, venous insufficiency, chronic pain, diastolic CHF, breast cancer in 2015, thrombosis on Eliquis who presents with lysed weakness that started at home, was brought to the ER where she was found to have  AKI on CKD 4, dehydration and was admitted to the hospital                                                                   Hospital Course     1.  Generalized weakness, transaminitis, AKI and dehydration.  Likely precipitated by recent addition of statin to her medical regimen, she seems to have generalized body aches and LFTs have gone up.  Stable CK, -ve acute hepatitis panel, unremarkable abdominal ultrasound, with supportive care and discontinuation of statin her LFTs have come down, she was adequately hydrated with IV fluids, seen by  PT OT, much improved now close to her baseline will be discharged home with home PT OT and a walker.  PCP to monitor LFTs closely, for now I am discontinuing her statin for possible statin allergy.     2.  8 mm incidental liver cyst.  PCP to monitor outpatient.   3.  Dyslipidemia.  Possible statin allergy.  Hold statin and monitor.   4.  AKI on CKD 4.  Resolved and now at baseline after IV fluids, nonacute abdominal ultrasound.   5.  Chronic diastolic CHF on echocardiogram done in 2022 with a EF of 60%.  Was dehydrated this admission, upon discharge as needed Lasix to continue at home.   6.  Nonspecific reactionary leukocytosis.  Afebrile, UA chest x-ray stable.  Monitor.   7.  Normocytic anemia. Stable anemia panel.   8.  History of DVT.  Continue Eliquis.    9.  History of CVA in the past and mild tremors.  On  Eliquis, statin held due to #1 above.  PT OT.   10. Insomnia - Continue home as needed Remeron and melatonin   11. DM type II.  Continue home regimen.  Lab Results  Component Value Date   HGBA1C 6.8 (H) 04/17/2021     Discharge diagnosis     Principal Problem:   Acute renal failure superimposed on stage 4 chronic kidney disease (HCC) Active Problems:   GERD without esophagitis   Coronary artery disease involving native coronary artery of native heart   Anemia in chronic kidney disease   History of stroke without residual deficits   Hyperlipidemia associated with type 2 diabetes mellitus (HCC)   Type 2 diabetes mellitus with stage 4 chronic kidney disease, without long-term current use of insulin (HCC)   Sleep disturbance   Leukocytosis   Chronic diarrhea - responds to budesonide   Chronic diastolic CHF (congestive heart failure) (HCC)   AKI (acute kidney injury) [facility])    Discharge instructions    Discharge Instructions     Discharge instructions   Complete by: As directed    Follow with Primary MD Seward Carol, MD in 7 days   Get CBC, CMP, 2 view  Chest X ray -  checked next visit within 1 week by Primary MD    Activity: As tolerated with Full fall precautions use walker/cane & assistance as needed  Disposition Home    Diet: Heart Healthy Low Carb  Special Instructions: If you have smoked or chewed Tobacco  in the last 2 yrs please stop smoking, stop any regular Alcohol  and or any Recreational drug use.  On your next visit with your primary care physician please Get Medicines reviewed and adjusted.  Please request your Prim.MD to go over all Hospital Tests and Procedure/Radiological results at the follow up, please get all Hospital records sent to your Prim MD by signing hospital release before you go home.  If you experience worsening of your admission symptoms, develop shortness of breath, life threatening emergency, suicidal or homicidal thoughts you must seek medical attention immediately by calling 911 or calling your MD immediately  if symptoms less severe.  You Must read complete instructions/literature along with all the possible adverse reactions/side effects for all the Medicines you take and that have been prescribed to you. Take any new Medicines after you have completely understood and accpet all the possible adverse reactions/side effects.   Increase activity slowly   Complete by: As directed        Discharge Medications   Allergies as of 04/19/2021       Reactions   Percocet [oxycodone-acetaminophen] Other (See Comments)   headaches   Diazepam Other (See Comments)   REACTION: Agitation Other reaction(s): agitation   Haloperidol Lactate Nausea And Vomiting   Lorazepam Nausea Only   Other reaction(s): nausea   Morphine Sulfate Other (See Comments)   REACTION: Agitation   Propoxyphene Hcl Nausea And Vomiting   Tramadol Hcl Nausea And Vomiting   Anesthetics, Halogenated    Per patient's daughter, she can not tolerate anesthetics.    Etomidate    Heart stopped during anesthesia   Fentanyl    Heart stopped  during anesthesia   Haloperidol    Other reaction(s): nausea & vomitin   Morphine Sulfate    Other reaction(s): nausea   Oxycodone-acetaminophen    Other reaction(s): headaches   Propoxyphene    Other reaction(s): nausea & vomiting   Tramadol Hcl    Other reaction(s): nausea &  vomitin Other reaction(s): nausea & vomitin   Versed [midazolam]    Heart stopped during anesthesia        Medication List     STOP taking these medications    rosuvastatin 40 MG tablet Commonly known as: CRESTOR   sucralfate 1 g tablet Commonly known as: CARAFATE       TAKE these medications    acetaminophen 500 MG tablet Commonly known as: TYLENOL Take 1,000 mg by mouth every 6 (six) hours as needed for moderate pain or headache.   apixaban 2.5 MG Tabs tablet Commonly known as: Eliquis Take 1 tablet (2.5 mg total) by mouth 2 (two) times daily.   B-12 COMPLIANCE INJECTION IJ Inject 1 vial as directed every 30 (thirty) days.   budesonide 3 MG 24 hr capsule Commonly known as: ENTOCORT EC Take 1 capsule by mouth once daily   DRY EYES OP Place 1 drop into the right eye 2 (two) times daily as needed (dry eye).   ergocalciferol 1.25 MG (50000 UT) capsule Commonly known as: VITAMIN D2 Take 1 capsule (50,000 Units total) by mouth once a week.   famotidine 10 MG tablet Commonly known as: PEPCID Take 1 tablet (10 mg total) by mouth at bedtime.   feeding supplement (NEPRO CARB STEADY) Liqd Take 237 mLs by mouth 2 (two) times daily between meals.   furosemide 20 MG tablet Commonly known as: LASIX Take 1 tablet (20 mg total) by mouth daily as needed (weight gain of >3Lbs in 1 day or >5 Lbs in 2 days.). What changed: reasons to take this   lipase/protease/amylase 36000 UNITS Cpep capsule Commonly known as: CREON Take 36,000 Units by mouth 3 (three) times daily with meals.   loperamide 2 MG tablet Commonly known as: IMODIUM A-D Take 4 mg by mouth 4 (four) times daily as needed for  diarrhea or loose stools.   melatonin 5 MG Tabs Take 1 tablet (5 mg total) by mouth at bedtime as needed (sleep).   mirtazapine 7.5 MG tablet Commonly known as: REMERON Take 1 tablet (7.5 mg total) by mouth at bedtime.   nitroGLYCERIN 0.4 MG SL tablet Commonly known as: NITROSTAT DISSOLVE 1 TABLET UNDER THE TONGUE EVERY 5 MINUTES AS  NEEDED FOR CHEST PAIN. MAX  OF 3 TABLETS IN 15 MINUTES. CALL 911 IF PAIN PERSISTS. What changed: See the new instructions.   ondansetron 4 MG disintegrating tablet Commonly known as: Zofran ODT Take 1 tablet (4 mg total) by mouth every 8 (eight) hours as needed for nausea or vomiting.   pantoprazole 40 MG tablet Commonly known as: PROTONIX TAKE 1 TABLET BY MOUTH  DAILY BEFORE BREAKFAST What changed: when to take this   sodium bicarbonate 650 MG tablet Take 2 tablets (1,300 mg total) by mouth 2 (two) times daily.               Durable Medical Equipment  (From admission, onward)           Start     Ordered   04/17/21 1548  For home use only DME 3 n 1  Once        04/17/21 1548   Unscheduled  DME Walker  Once       Question Answer Comment  Walker: With 5 Inch Wheels   Patient needs a walker to treat with the following condition Weakness      04/19/21 0851             Follow-up Information  Seward Carol, MD. Schedule an appointment as soon as possible for a visit in 1 week(s).   Specialty: Internal Medicine Contact information: [address] [place] [address] [phone]         Josue Hector, MD .   Specialty: Cardiology Contact information: [phone] [address] [place] [address] [place] [address] [phone]                 Major procedures and Radiology Reports - PLEASE review detailed and final reports thoroughly  -       DG Chest 2 View  Result Date: 04/16/2021 CLINICAL DATA:  sob EXAM: CHEST - 2 VIEW COMPARISON:  November 15, 2020 FINDINGS: The cardiomediastinal silhouette  is unchanged in contour. No pleural effusion. No pneumothorax. No acute pleuroparenchymal abnormality. Visualized abdomen is unremarkable. Surgical clips project over the RIGHT axilla and breast. IMPRESSION: No acute cardiopulmonary abnormality. Electronically Signed   By: Valentino Saxon M.D.   On: 04/16/2021 16:38   CT Head Wo Contrast  Result Date: 04/16/2021 CLINICAL DATA:  Acute neurologic deficit. Weakness. EXAM: CT HEAD WITHOUT CONTRAST TECHNIQUE: Contiguous axial images were obtained from the base of the skull through the vertex without intravenous contrast. RADIATION DOSE REDUCTION: This exam was performed according to the departmental dose-optimization program which includes automated exposure control, adjustment of the mA and/or kV according to patient size and/or use of iterative reconstruction technique. COMPARISON:  None. FINDINGS: Brain: No evidence of intracranial hemorrhage, acute infarction, hydrocephalus, extra-axial collection, or mass lesion/mass effect. Mild chronic small vessel disease is again seen. Old left basal ganglia lacunar infarct is stable. Persistent cavum septum pellucidum and cavum vergae incidentally noted. Vascular:  No hyperdense vessel or other acute findings. Skull: No evidence of fracture or other significant bone abnormality. Sinuses/Orbits:  No acute findings. Other: None. IMPRESSION: No acute intracranial abnormality. Stable mild chronic small vessel disease, and old left basal ganglia lacunar infarct. Electronically Signed   By: Marlaine Hind M.D.   On: 04/16/2021 17:47   US Abdomen Complete  Result Date: 04/17/2021 CLINICAL DATA:  Acute kidney injury.  Elevated liver function tests. EXAM: ABDOMEN ULTRASOUND COMPLETE COMPARISON:  Abdomen and pelvis CT dated 12/02/2020 FINDINGS: Gallbladder: Surgically absent. Common bile duct: Diameter: 6.5 mm Liver: 8 mm simple cyst in the left lobe. Normal echotexture. Portal vein is patent on color Doppler imaging with normal  direction of blood flow towards the liver. IVC: No abnormality visualized. Pancreas: Mildly dilated pancreatic duct measuring 3.4 mm in maximum diameter. No visible stone or mass. Spleen: Size and appearance within normal limits. Right Kidney: Length: 9.1 cm. Multiple simple cysts. Normal echotexture. No hydronephrosis. Left Kidney: Length: 10.9 cm. Multiple simple cysts. Normal echotexture. No hydronephrosis. Abdominal aorta: No aneurysm visualized. Other findings: None. IMPRESSION: 1. Normal appearing liver with an 8 mm incidental cyst noted. 2. Bilateral simple renal cysts with bilateral normal renal echotexture without hydronephrosis. 3. Mildly dilated pancreatic duct. No corresponding abnormality on the recent CT other than mild diffuse pancreatic atrophy. This is of doubtful clinical significance. Electronically Signed   By: Claudie Revering M.D.   On: 04/17/2021 09:19    Today   Subjective    [name] today has no headache,no chest abdominal pain,no new weakness tingling or numbness, feels much better wants to go home today.     Objective   Blood pressure (!) 152/69, pulse 74, temperature 98.7 F (37.1 C), temperature source Oral, resp. rate 17, height 5' 4"  (1.626 m),  weight 71.1 kg, SpO2 99 %.   Intake/Output Summary (Last 24 hours) at 04/19/2021 0851 Last data filed at 04/19/2021 0800 Gross per 24 hour  Intake 977.17 ml  Output 1000 ml  Net -22.83 ml    Exam  Awake Alert, No new F.N deficits,    [place].AT,PERRAL Supple Neck,   Symmetrical Chest wall movement, Good air movement bilaterally, CTAB RRR,No Gallops,   +ve B.Sounds, Abd Soft, Non tender,  No Cyanosis, Clubbing or edema    Data Review   CBC w Diff:  Lab Results  Component Value Date   WBC 10.9 (H) 04/19/2021   HGB 8.6 (L) 04/19/2021   HGB 9.2 (L) 04/13/2021   HGB 9.3 (L) 03/14/2017   HCT 27.6 (L) 04/19/2021   HCT 26.4 (L) 07/21/2019   HCT 29.2 (L) 03/14/2017   PLT 193 04/19/2021   PLT 214 04/13/2021   PLT 292  03/14/2017   PLT 351 10/11/2016   LYMPHOPCT 19 04/19/2021   LYMPHOPCT 22.2 03/14/2017   MONOPCT 8 04/19/2021   MONOPCT 6.4 03/14/2017   EOSPCT 1 04/19/2021   EOSPCT 1.3 03/14/2017   BASOPCT 0 04/19/2021   BASOPCT 0.3 03/14/2017    CMP:  Lab Results  Component Value Date   NA 139 04/19/2021   NA 144 07/19/2020   NA 140 03/14/2017   K 4.8 04/19/2021   K 3.8 03/14/2017   CL 107 04/19/2021   CO2 24 04/19/2021   CO2 27 03/14/2017   BUN 53 (H) 04/19/2021   BUN 85 (HH) 07/19/2020   BUN 35.6 (H) 03/14/2017   CREATININE 2.56 (H) 04/19/2021   CREATININE 2.66 (H) 12/22/2020   CREATININE 1.8 (H) 03/14/2017   PROT 6.0 (L) 04/19/2021   PROT 7.8 03/14/2017   ALBUMIN 2.6 (L) 04/19/2021   ALBUMIN 3.5 03/14/2017   BILITOT 0.4 04/19/2021   BILITOT 0.4 12/22/2020   BILITOT 0.28 03/14/2017   ALKPHOS 140 (H) 04/19/2021   ALKPHOS 61 03/14/2017   AST 40 04/19/2021   AST 63 (H) 12/22/2020   AST 17 03/14/2017   ALT 153 (H) 04/19/2021   ALT 60 (H) 12/22/2020   ALT 17 03/14/2017  .   Total Time in preparing paper work, data evaluation and todays exam - 35 minutes  Lala Lund M.D on 04/19/2021 at 8:51 AM  [facility]

## 2021-04-19 NOTE — Progress Notes (Signed)
Physical Therapy Treatment Patient Details Name: [name] MRN: [id number] DOB: 1945/06/03 Today's Date: 04/19/2021   History of Present Illness Pt is a 76 y/o female admitted secondary to SOB and tremors. Found to have AKI. PMH includes CAD, HTN, CKD, breast cancer, Covid, DM.    PT Comments    Patient OOB in recliner on arrival. Patient ambulating at supervision level with no AD. Patient able to negotiate flight of stairs with L hand rail and supervision. Educated patient on exercises to perform at home (standing marching, mini squats, LAQ, heel/toe raises, seated marching), patient verbalized and demonstrated understanding. D/c plan remains appropriate.    Recommendations for follow up therapy are one component of a multi-disciplinary discharge planning process, led by the attending physician.  Recommendations may be updated based on patient status, additional functional criteria and insurance authorization.  Follow Up Recommendations  Home health PT     Assistance Recommended at Discharge Frequent or constant Supervision/Assistance (initially)  Patient can return home with the following A little help with walking and/or transfers;A little help with bathing/dressing/bathroom   Equipment Recommendations  None recommended by PT    Recommendations for Other Services       Precautions / Restrictions Precautions Precautions: Fall Restrictions Weight Bearing Restrictions: No     Mobility  Bed Mobility               General bed mobility comments: in recliner on arrival    Transfers Overall transfer level: Needs assistance Equipment used: None Transfers: Sit to/from Stand Sit to Stand: Supervision                Ambulation/Gait Ambulation/Gait assistance: Supervision Gait Distance (Feet): 200 Feet Assistive device: None Gait Pattern/deviations: Step-through pattern, Decreased stride length Gait velocity: Decreased Gait velocity interpretation: >2.62  ft/sec, indicative of community ambulatory   General Gait Details: decreased stance time on R which patient reports is baseline   Stairs Stairs: Yes Stairs assistance: Supervision Stair Management: One rail Left, Step to pattern, Forwards Number of Stairs: 10     Wheelchair Mobility    Modified Rankin (Stroke Patients Only)       Balance Overall balance assessment: Mild deficits observed, not formally tested                                          Cognition Arousal/Alertness: Awake/alert Behavior During Therapy: WFL for tasks assessed/performed Overall Cognitive Status: Impaired/Different from baseline Area of Impairment: Memory, Safety/judgement, Awareness, Problem solving                     Memory: Decreased short-term memory   Safety/Judgement: Decreased awareness of safety, Decreased awareness of deficits Awareness: Emergent Problem Solving: Slow processing          Exercises      General Comments        Pertinent Vitals/Pain Pain Assessment Pain Assessment: No/denies pain    Home Living                          Prior Function            PT Goals (current goals can now be found in the care plan section) Acute Rehab PT Goals Patient Stated Goal: to go home PT Goal Formulation: With patient Time For Goal Achievement: 05/01/21 Potential to Achieve Goals: Good  Progress towards PT goals: Progressing toward goals    Frequency    Min 3X/week      PT Plan Current plan remains appropriate    Co-evaluation              AM-PAC PT "6 Clicks" Mobility   Outcome Measure  Help needed turning from your back to your side while in a flat bed without using bedrails?: None Help needed moving from lying on your back to sitting on the side of a flat bed without using bedrails?: A Little Help needed moving to and from a bed to a chair (including a wheelchair)?: A Little Help needed standing up from a chair  using your arms (e.g., wheelchair or bedside chair)?: A Little Help needed to walk in hospital room?: A Little Help needed climbing 3-5 steps with a railing? : A Little 6 Click Score: 19    End of Session   Activity Tolerance: Patient tolerated treatment well Patient left: in chair;with call bell/phone within reach Nurse Communication: Mobility status PT Visit Diagnosis: Unsteadiness on feet (R26.81);Muscle weakness (generalized) (M62.81)     Time: 9357-0177 PT Time Calculation (min) (ACUTE ONLY): 11 min  Charges:  $Gait Training: 8-22 mins                     Chalyn Amescua A. Gilford Rile PT, DPT Acute Rehabilitation Services Pager [phone] Office [phone]    Linna Hoff 04/19/2021, 10:51 AM

## 2021-04-19 NOTE — TOC Transition Note (Signed)
Transition of Care [facility]) - CM/SW Discharge Note   Patient Details  Name: [name] MRN: [id number] Date of Birth: May 15, 1945  [facility] CM/SW Contact:  Tom-Johnson, Renea Ee, RN Phone Number: 04/19/2021, 10:10 AM   Clinical Narrative:     Patient is scheduled for discharge today. Home health referral information on AVS. Bedside commode delivered at bedside. Daughter to transport at discharge. Denies any other needs at this time. No further TOC needs noted.   Final next level of care: [place] Barriers to Discharge: Barriers Resolved   Patient Goals and CMS Choice Patient states their goals for this hospitalization and ongoing recovery are:: To return home CMS Medicare.gov Compare Post Acute Care list provided to:: Patient Choice offered to / list presented to : Patient, Adult Children ([place])  Discharge Placement                Patient to be transferred to facility by: Daughter      Discharge Plan and Services   Discharge Planning Services: CM Consult Post Acute Care Choice: Home Health          DME Arranged: 3-N-1 DME Agency: [employer] Date DME Agency Contacted: 04/17/21 Time DME Agency Contacted: 9 Representative spoke with at DME Agency: Freda Munro HH Arranged: PT [place]: [place] Date [place]: 04/17/21 Time [place]: 38 Representative spoke with at [place]: Amy  Social Determinants of Health ([place]) Interventions     Readmission Risk Interventions Readmission Risk Prevention Plan 09/04/2020  Transportation Screening Complete  Medication Review Press photographer) Complete  HRI or [place] Complete  SW Recovery Care/Counseling Consult Complete  [place] Not Applicable  Some recent data might be hidden

## 2021-04-25 DIAGNOSIS — N179 Acute kidney failure, unspecified: Secondary | ICD-10-CM | POA: Diagnosis not present

## 2021-04-25 DIAGNOSIS — M48061 Spinal stenosis, lumbar region without neurogenic claudication: Secondary | ICD-10-CM | POA: Diagnosis not present

## 2021-04-25 DIAGNOSIS — G720 Drug-induced myopathy: Secondary | ICD-10-CM | POA: Diagnosis not present

## 2021-04-25 DIAGNOSIS — N184 Chronic kidney disease, stage 4 (severe): Secondary | ICD-10-CM | POA: Diagnosis not present

## 2021-04-25 DIAGNOSIS — R7989 Other specified abnormal findings of blood chemistry: Secondary | ICD-10-CM | POA: Diagnosis not present

## 2021-04-25 DIAGNOSIS — E7800 Pure hypercholesterolemia, unspecified: Secondary | ICD-10-CM | POA: Diagnosis not present

## 2021-04-25 DIAGNOSIS — K719 Toxic liver disease, unspecified: Secondary | ICD-10-CM | POA: Diagnosis not present

## 2021-04-26 DIAGNOSIS — Z8673 Personal history of transient ischemic attack (TIA), and cerebral infarction without residual deficits: Secondary | ICD-10-CM | POA: Diagnosis not present

## 2021-04-26 DIAGNOSIS — I5032 Chronic diastolic (congestive) heart failure: Secondary | ICD-10-CM | POA: Diagnosis not present

## 2021-04-26 DIAGNOSIS — I872 Venous insufficiency (chronic) (peripheral): Secondary | ICD-10-CM | POA: Diagnosis not present

## 2021-04-26 DIAGNOSIS — D649 Anemia, unspecified: Secondary | ICD-10-CM | POA: Diagnosis not present

## 2021-04-26 DIAGNOSIS — M109 Gout, unspecified: Secondary | ICD-10-CM | POA: Diagnosis not present

## 2021-04-26 DIAGNOSIS — I129 Hypertensive chronic kidney disease with stage 1 through stage 4 chronic kidney disease, or unspecified chronic kidney disease: Secondary | ICD-10-CM | POA: Diagnosis not present

## 2021-04-26 DIAGNOSIS — I2510 Atherosclerotic heart disease of native coronary artery without angina pectoris: Secondary | ICD-10-CM | POA: Diagnosis not present

## 2021-04-26 DIAGNOSIS — N179 Acute kidney failure, unspecified: Secondary | ICD-10-CM | POA: Diagnosis not present

## 2021-04-26 DIAGNOSIS — E1122 Type 2 diabetes mellitus with diabetic chronic kidney disease: Secondary | ICD-10-CM | POA: Diagnosis not present

## 2021-04-26 DIAGNOSIS — N184 Chronic kidney disease, stage 4 (severe): Secondary | ICD-10-CM | POA: Diagnosis not present

## 2021-04-27 ENCOUNTER — Inpatient Hospital Stay: Payer: Medicare Other

## 2021-04-27 ENCOUNTER — Inpatient Hospital Stay: Payer: Medicare Other | Attending: Hematology

## 2021-04-27 DIAGNOSIS — D631 Anemia in chronic kidney disease: Secondary | ICD-10-CM | POA: Insufficient documentation

## 2021-04-27 DIAGNOSIS — N184 Chronic kidney disease, stage 4 (severe): Secondary | ICD-10-CM | POA: Insufficient documentation

## 2021-04-27 DIAGNOSIS — E538 Deficiency of other specified B group vitamins: Secondary | ICD-10-CM | POA: Insufficient documentation

## 2021-05-01 DIAGNOSIS — D649 Anemia, unspecified: Secondary | ICD-10-CM | POA: Diagnosis not present

## 2021-05-01 DIAGNOSIS — N184 Chronic kidney disease, stage 4 (severe): Secondary | ICD-10-CM | POA: Diagnosis not present

## 2021-05-01 DIAGNOSIS — Z8673 Personal history of transient ischemic attack (TIA), and cerebral infarction without residual deficits: Secondary | ICD-10-CM | POA: Diagnosis not present

## 2021-05-01 DIAGNOSIS — M109 Gout, unspecified: Secondary | ICD-10-CM | POA: Diagnosis not present

## 2021-05-01 DIAGNOSIS — I129 Hypertensive chronic kidney disease with stage 1 through stage 4 chronic kidney disease, or unspecified chronic kidney disease: Secondary | ICD-10-CM | POA: Diagnosis not present

## 2021-05-01 DIAGNOSIS — E1122 Type 2 diabetes mellitus with diabetic chronic kidney disease: Secondary | ICD-10-CM | POA: Diagnosis not present

## 2021-05-01 DIAGNOSIS — I5032 Chronic diastolic (congestive) heart failure: Secondary | ICD-10-CM | POA: Diagnosis not present

## 2021-05-01 DIAGNOSIS — I2510 Atherosclerotic heart disease of native coronary artery without angina pectoris: Secondary | ICD-10-CM | POA: Diagnosis not present

## 2021-05-01 DIAGNOSIS — N179 Acute kidney failure, unspecified: Secondary | ICD-10-CM | POA: Diagnosis not present

## 2021-05-01 DIAGNOSIS — I872 Venous insufficiency (chronic) (peripheral): Secondary | ICD-10-CM | POA: Diagnosis not present

## 2021-05-03 ENCOUNTER — Inpatient Hospital Stay: Admission: RE | Admit: 2021-05-03 | Payer: Medicare Other | Source: Ambulatory Visit

## 2021-05-08 ENCOUNTER — Other Ambulatory Visit: Payer: Self-pay | Admitting: Hematology

## 2021-05-10 DIAGNOSIS — Z8673 Personal history of transient ischemic attack (TIA), and cerebral infarction without residual deficits: Secondary | ICD-10-CM | POA: Diagnosis not present

## 2021-05-10 DIAGNOSIS — D649 Anemia, unspecified: Secondary | ICD-10-CM | POA: Diagnosis not present

## 2021-05-10 DIAGNOSIS — M109 Gout, unspecified: Secondary | ICD-10-CM | POA: Diagnosis not present

## 2021-05-10 DIAGNOSIS — I2510 Atherosclerotic heart disease of native coronary artery without angina pectoris: Secondary | ICD-10-CM | POA: Diagnosis not present

## 2021-05-10 DIAGNOSIS — I5032 Chronic diastolic (congestive) heart failure: Secondary | ICD-10-CM | POA: Diagnosis not present

## 2021-05-10 DIAGNOSIS — E1122 Type 2 diabetes mellitus with diabetic chronic kidney disease: Secondary | ICD-10-CM | POA: Diagnosis not present

## 2021-05-10 DIAGNOSIS — I129 Hypertensive chronic kidney disease with stage 1 through stage 4 chronic kidney disease, or unspecified chronic kidney disease: Secondary | ICD-10-CM | POA: Diagnosis not present

## 2021-05-10 DIAGNOSIS — I872 Venous insufficiency (chronic) (peripheral): Secondary | ICD-10-CM | POA: Diagnosis not present

## 2021-05-10 DIAGNOSIS — N179 Acute kidney failure, unspecified: Secondary | ICD-10-CM | POA: Diagnosis not present

## 2021-05-10 DIAGNOSIS — N184 Chronic kidney disease, stage 4 (severe): Secondary | ICD-10-CM | POA: Diagnosis not present

## 2021-05-11 ENCOUNTER — Inpatient Hospital Stay: Payer: Medicare Other

## 2021-05-11 ENCOUNTER — Other Ambulatory Visit: Payer: Self-pay

## 2021-05-11 VITALS — BP 158/53 | HR 93 | Temp 98.8°F | Resp 18

## 2021-05-11 DIAGNOSIS — D464 Refractory anemia, unspecified: Secondary | ICD-10-CM

## 2021-05-11 DIAGNOSIS — N184 Chronic kidney disease, stage 4 (severe): Secondary | ICD-10-CM | POA: Diagnosis not present

## 2021-05-11 DIAGNOSIS — D509 Iron deficiency anemia, unspecified: Secondary | ICD-10-CM

## 2021-05-11 DIAGNOSIS — E538 Deficiency of other specified B group vitamins: Secondary | ICD-10-CM | POA: Diagnosis not present

## 2021-05-11 DIAGNOSIS — D631 Anemia in chronic kidney disease: Secondary | ICD-10-CM | POA: Diagnosis not present

## 2021-05-11 LAB — CBC WITH DIFFERENTIAL (CANCER CENTER ONLY)
Abs Immature Granulocytes: 0.23 10*3/uL — ABNORMAL HIGH (ref 0.00–0.07)
Basophils Absolute: 0.1 10*3/uL (ref 0.0–0.1)
Basophils Relative: 1 %
Eosinophils Absolute: 0.1 10*3/uL (ref 0.0–0.5)
Eosinophils Relative: 1 %
HCT: 28.2 % — ABNORMAL LOW (ref 36.0–46.0)
Hemoglobin: 9 g/dL — ABNORMAL LOW (ref 12.0–15.0)
Immature Granulocytes: 2 %
Lymphocytes Relative: 23 %
Lymphs Abs: 2.3 10*3/uL (ref 0.7–4.0)
MCH: 26.1 pg (ref 26.0–34.0)
MCHC: 31.9 g/dL (ref 30.0–36.0)
MCV: 81.7 fL (ref 80.0–100.0)
Monocytes Absolute: 0.7 10*3/uL (ref 0.1–1.0)
Monocytes Relative: 7 %
Neutro Abs: 6.7 10*3/uL (ref 1.7–7.7)
Neutrophils Relative %: 66 %
Platelet Count: 273 10*3/uL (ref 150–400)
RBC: 3.45 MIL/uL — ABNORMAL LOW (ref 3.87–5.11)
RDW: 19.8 % — ABNORMAL HIGH (ref 11.5–15.5)
WBC Count: 10 10*3/uL (ref 4.0–10.5)
nRBC: 0 % (ref 0.0–0.2)

## 2021-05-11 MED ORDER — EPOETIN ALFA-EPBX 40000 UNIT/ML IJ SOLN
40000.0000 [IU] | Freq: Once | INTRAMUSCULAR | Status: AC
  Administered 2021-05-11: 40000 [IU] via SUBCUTANEOUS
  Filled 2021-05-11: qty 1

## 2021-05-11 MED ORDER — CYANOCOBALAMIN 1000 MCG/ML IJ SOLN
1000.0000 ug | Freq: Once | INTRAMUSCULAR | Status: AC
  Administered 2021-05-11: 1000 ug via INTRAMUSCULAR
  Filled 2021-05-11: qty 1

## 2021-05-18 DIAGNOSIS — M109 Gout, unspecified: Secondary | ICD-10-CM | POA: Diagnosis not present

## 2021-05-18 DIAGNOSIS — Z8673 Personal history of transient ischemic attack (TIA), and cerebral infarction without residual deficits: Secondary | ICD-10-CM | POA: Diagnosis not present

## 2021-05-18 DIAGNOSIS — N184 Chronic kidney disease, stage 4 (severe): Secondary | ICD-10-CM | POA: Diagnosis not present

## 2021-05-18 DIAGNOSIS — N179 Acute kidney failure, unspecified: Secondary | ICD-10-CM | POA: Diagnosis not present

## 2021-05-18 DIAGNOSIS — I5032 Chronic diastolic (congestive) heart failure: Secondary | ICD-10-CM | POA: Diagnosis not present

## 2021-05-18 DIAGNOSIS — I2510 Atherosclerotic heart disease of native coronary artery without angina pectoris: Secondary | ICD-10-CM | POA: Diagnosis not present

## 2021-05-18 DIAGNOSIS — I129 Hypertensive chronic kidney disease with stage 1 through stage 4 chronic kidney disease, or unspecified chronic kidney disease: Secondary | ICD-10-CM | POA: Diagnosis not present

## 2021-05-18 DIAGNOSIS — I872 Venous insufficiency (chronic) (peripheral): Secondary | ICD-10-CM | POA: Diagnosis not present

## 2021-05-18 DIAGNOSIS — D649 Anemia, unspecified: Secondary | ICD-10-CM | POA: Diagnosis not present

## 2021-05-18 DIAGNOSIS — E1122 Type 2 diabetes mellitus with diabetic chronic kidney disease: Secondary | ICD-10-CM | POA: Diagnosis not present

## 2021-05-25 ENCOUNTER — Other Ambulatory Visit: Payer: Self-pay

## 2021-05-25 ENCOUNTER — Inpatient Hospital Stay: Payer: Medicare Other | Attending: Hematology

## 2021-05-25 ENCOUNTER — Inpatient Hospital Stay: Payer: Medicare Other

## 2021-05-25 VITALS — BP 159/67 | HR 93 | Temp 99.0°F | Resp 18

## 2021-05-25 DIAGNOSIS — N184 Chronic kidney disease, stage 4 (severe): Secondary | ICD-10-CM | POA: Diagnosis not present

## 2021-05-25 DIAGNOSIS — D464 Refractory anemia, unspecified: Secondary | ICD-10-CM

## 2021-05-25 DIAGNOSIS — D631 Anemia in chronic kidney disease: Secondary | ICD-10-CM | POA: Insufficient documentation

## 2021-05-25 DIAGNOSIS — D509 Iron deficiency anemia, unspecified: Secondary | ICD-10-CM

## 2021-05-25 LAB — CBC WITH DIFFERENTIAL (CANCER CENTER ONLY)
Abs Immature Granulocytes: 0.05 10*3/uL (ref 0.00–0.07)
Basophils Absolute: 0 10*3/uL (ref 0.0–0.1)
Basophils Relative: 0 %
Eosinophils Absolute: 0.1 10*3/uL (ref 0.0–0.5)
Eosinophils Relative: 1 %
HCT: 26.9 % — ABNORMAL LOW (ref 36.0–46.0)
Hemoglobin: 8.5 g/dL — ABNORMAL LOW (ref 12.0–15.0)
Immature Granulocytes: 1 %
Lymphocytes Relative: 19 %
Lymphs Abs: 1.5 10*3/uL (ref 0.7–4.0)
MCH: 26.2 pg (ref 26.0–34.0)
MCHC: 31.6 g/dL (ref 30.0–36.0)
MCV: 83 fL (ref 80.0–100.0)
Monocytes Absolute: 0.6 10*3/uL (ref 0.1–1.0)
Monocytes Relative: 8 %
Neutro Abs: 5.5 10*3/uL (ref 1.7–7.7)
Neutrophils Relative %: 71 %
Platelet Count: 203 10*3/uL (ref 150–400)
RBC: 3.24 MIL/uL — ABNORMAL LOW (ref 3.87–5.11)
RDW: 19.7 % — ABNORMAL HIGH (ref 11.5–15.5)
WBC Count: 7.7 10*3/uL (ref 4.0–10.5)
nRBC: 0 % (ref 0.0–0.2)

## 2021-05-25 MED ORDER — EPOETIN ALFA-EPBX 40000 UNIT/ML IJ SOLN
40000.0000 [IU] | Freq: Once | INTRAMUSCULAR | Status: AC
  Administered 2021-05-25: 16:00:00 40000 [IU] via SUBCUTANEOUS
  Filled 2021-05-25: qty 1

## 2021-06-08 ENCOUNTER — Other Ambulatory Visit: Payer: Self-pay

## 2021-06-08 ENCOUNTER — Inpatient Hospital Stay: Payer: Medicare Other

## 2021-06-08 VITALS — BP 127/81 | HR 85 | Temp 99.4°F | Resp 16

## 2021-06-08 DIAGNOSIS — D631 Anemia in chronic kidney disease: Secondary | ICD-10-CM | POA: Diagnosis not present

## 2021-06-08 DIAGNOSIS — D464 Refractory anemia, unspecified: Secondary | ICD-10-CM

## 2021-06-08 DIAGNOSIS — D509 Iron deficiency anemia, unspecified: Secondary | ICD-10-CM

## 2021-06-08 DIAGNOSIS — N184 Chronic kidney disease, stage 4 (severe): Secondary | ICD-10-CM | POA: Diagnosis not present

## 2021-06-08 LAB — CBC WITH DIFFERENTIAL (CANCER CENTER ONLY)
Abs Immature Granulocytes: 0.04 10*3/uL (ref 0.00–0.07)
Basophils Absolute: 0 10*3/uL (ref 0.0–0.1)
Basophils Relative: 0 %
Eosinophils Absolute: 0.1 10*3/uL (ref 0.0–0.5)
Eosinophils Relative: 1 %
HCT: 29.8 % — ABNORMAL LOW (ref 36.0–46.0)
Hemoglobin: 8.9 g/dL — ABNORMAL LOW (ref 12.0–15.0)
Immature Granulocytes: 1 %
Lymphocytes Relative: 18 %
Lymphs Abs: 1.3 10*3/uL (ref 0.7–4.0)
MCH: 26.2 pg (ref 26.0–34.0)
MCHC: 29.9 g/dL — ABNORMAL LOW (ref 30.0–36.0)
MCV: 87.6 fL (ref 80.0–100.0)
Monocytes Absolute: 0.6 10*3/uL (ref 0.1–1.0)
Monocytes Relative: 8 %
Neutro Abs: 5.4 10*3/uL (ref 1.7–7.7)
Neutrophils Relative %: 72 %
Platelet Count: 304 10*3/uL (ref 150–400)
RBC: 3.4 MIL/uL — ABNORMAL LOW (ref 3.87–5.11)
RDW: 18.3 % — ABNORMAL HIGH (ref 11.5–15.5)
WBC Count: 7.5 10*3/uL (ref 4.0–10.5)
nRBC: 0 % (ref 0.0–0.2)

## 2021-06-08 MED ORDER — CYANOCOBALAMIN 1000 MCG/ML IJ SOLN: 1000.0000 ug | Freq: Once | INTRAMUSCULAR | Status: DC

## 2021-06-08 MED ORDER — EPOETIN ALFA-EPBX 40000 UNIT/ML IJ SOLN
40000.0000 [IU] | Freq: Once | INTRAMUSCULAR | Status: AC
  Administered 2021-06-08: 40000 [IU] via SUBCUTANEOUS
  Filled 2021-06-08: qty 1

## 2021-06-16 DIAGNOSIS — N184 Chronic kidney disease, stage 4 (severe): Secondary | ICD-10-CM | POA: Diagnosis not present

## 2021-06-16 DIAGNOSIS — E7800 Pure hypercholesterolemia, unspecified: Secondary | ICD-10-CM | POA: Diagnosis not present

## 2021-06-16 DIAGNOSIS — E1122 Type 2 diabetes mellitus with diabetic chronic kidney disease: Secondary | ICD-10-CM | POA: Diagnosis not present

## 2021-06-16 DIAGNOSIS — I10 Essential (primary) hypertension: Secondary | ICD-10-CM | POA: Diagnosis not present

## 2021-06-21 ENCOUNTER — Other Ambulatory Visit: Payer: Self-pay

## 2021-06-21 DIAGNOSIS — D509 Iron deficiency anemia, unspecified: Secondary | ICD-10-CM

## 2021-06-22 ENCOUNTER — Inpatient Hospital Stay (HOSPITAL_BASED_OUTPATIENT_CLINIC_OR_DEPARTMENT_OTHER): Payer: Medicare Other | Admitting: Hematology

## 2021-06-22 ENCOUNTER — Inpatient Hospital Stay: Payer: Medicare Other | Attending: Hematology

## 2021-06-22 ENCOUNTER — Inpatient Hospital Stay: Payer: Medicare Other

## 2021-06-22 ENCOUNTER — Other Ambulatory Visit: Payer: Self-pay

## 2021-06-22 VITALS — BP 143/62 | HR 85 | Temp 98.8°F | Resp 18 | Ht 64.0 in | Wt 150.7 lb

## 2021-06-22 DIAGNOSIS — D631 Anemia in chronic kidney disease: Secondary | ICD-10-CM

## 2021-06-22 DIAGNOSIS — C50311 Malignant neoplasm of lower-inner quadrant of right female breast: Secondary | ICD-10-CM

## 2021-06-22 DIAGNOSIS — Z170 Estrogen receptor positive status [ER+]: Secondary | ICD-10-CM

## 2021-06-22 DIAGNOSIS — D509 Iron deficiency anemia, unspecified: Secondary | ICD-10-CM

## 2021-06-22 DIAGNOSIS — N184 Chronic kidney disease, stage 4 (severe): Secondary | ICD-10-CM | POA: Insufficient documentation

## 2021-06-22 DIAGNOSIS — E538 Deficiency of other specified B group vitamins: Secondary | ICD-10-CM | POA: Insufficient documentation

## 2021-06-22 DIAGNOSIS — D464 Refractory anemia, unspecified: Secondary | ICD-10-CM

## 2021-06-22 LAB — CBC WITH DIFFERENTIAL (CANCER CENTER ONLY)
Abs Immature Granulocytes: 0.02 10*3/uL (ref 0.00–0.07)
Basophils Absolute: 0 10*3/uL (ref 0.0–0.1)
Basophils Relative: 1 %
Eosinophils Absolute: 0 10*3/uL (ref 0.0–0.5)
Eosinophils Relative: 0 %
HCT: 31.4 % — ABNORMAL LOW (ref 36.0–46.0)
Hemoglobin: 9.7 g/dL — ABNORMAL LOW (ref 12.0–15.0)
Immature Granulocytes: 0 %
Lymphocytes Relative: 18 %
Lymphs Abs: 1.4 10*3/uL (ref 0.7–4.0)
MCH: 25.3 pg — ABNORMAL LOW (ref 26.0–34.0)
MCHC: 30.9 g/dL (ref 30.0–36.0)
MCV: 81.8 fL (ref 80.0–100.0)
Monocytes Absolute: 0.6 10*3/uL (ref 0.1–1.0)
Monocytes Relative: 7 %
Neutro Abs: 5.8 10*3/uL (ref 1.7–7.7)
Neutrophils Relative %: 74 %
Platelet Count: 254 10*3/uL (ref 150–400)
RBC: 3.84 MIL/uL — ABNORMAL LOW (ref 3.87–5.11)
RDW: 17.2 % — ABNORMAL HIGH (ref 11.5–15.5)
WBC Count: 7.8 10*3/uL (ref 4.0–10.5)
nRBC: 0 % (ref 0.0–0.2)

## 2021-06-22 LAB — CMP (CANCER CENTER ONLY)
ALT: 10 U/L (ref 0–44)
AST: 13 U/L — ABNORMAL LOW (ref 15–41)
Albumin: 3.9 g/dL (ref 3.5–5.0)
Alkaline Phosphatase: 87 U/L (ref 38–126)
Anion gap: 10 (ref 5–15)
BUN: 51 mg/dL — ABNORMAL HIGH (ref 8–23)
CO2: 21 mmol/L — ABNORMAL LOW (ref 22–32)
Calcium: 9.3 mg/dL (ref 8.9–10.3)
Chloride: 106 mmol/L (ref 98–111)
Creatinine: 3.51 mg/dL (ref 0.44–1.00)
GFR, Estimated: 13 mL/min — ABNORMAL LOW (ref >60)
Glucose, Bld: 96 mg/dL (ref 70–99)
Potassium: 4.5 mmol/L (ref 3.5–5.1)
Sodium: 137 mmol/L (ref 135–145)
Total Bilirubin: 0.4 mg/dL (ref 0.3–1.2)
Total Protein: 7.9 g/dL (ref 6.5–8.1)

## 2021-06-22 LAB — IRON AND IRON BINDING CAPACITY (CC-WL,HP ONLY)
Iron: 50 ug/dL (ref 28–170)
Saturation Ratios: 12 % (ref 10.4–31.8)
TIBC: 402 ug/dL (ref 250–450)
UIBC: 352 ug/dL (ref 148–442)

## 2021-06-22 MED ORDER — EPOETIN ALFA-EPBX 40000 UNIT/ML IJ SOLN
40000.0000 [IU] | Freq: Once | INTRAMUSCULAR | Status: AC
  Administered 2021-06-22: 40000 [IU] via SUBCUTANEOUS
  Filled 2021-06-22: qty 1

## 2021-06-22 MED ORDER — CYANOCOBALAMIN 1000 MCG/ML IJ SOLN
1000.0000 ug | Freq: Once | INTRAMUSCULAR | Status: AC
  Administered 2021-06-22: 1000 ug via INTRAMUSCULAR
  Filled 2021-06-22: qty 1

## 2021-06-22 NOTE — Progress Notes (Signed)
?[place]   ?[phone]) [phone] [phone]   ?Clinic Follow up Note  ? ?Patient Care Team: ?Seward Carol, MD as PCP - General (Internal Medicine) ?Josue Hector, MD as PCP - Cardiology (Cardiology) ?Thelma Comp, OD as Consulting Physician (Optometry) ?Stark Klein, MD as Consulting Physician (General Surgery) ?Truitt Merle, MD as Consulting Physician (Hematology) ?Madelon Lips, MD as Consulting Physician (Nephrology) ? ?Date of Service:  06/22/2021 ? ?CHIEF COMPLAINT: f/u of anemia of chronic disease, h/o right breast cancer ? ?CURRENT THERAPY:  ?-Retacrit injection starting 09/2018, currently q2weeks ?-B12 injection starting 05/04/19, currently q6weeks ?-Oral Ferrous sulfate daily  ?-IV Feraheme as needed starting 09/15/18. Last on 12/21/19 ? ?ASSESSMENT & PLAN:  ?[name] is a 76 y.o. female with  ? ?1. Anemia of chronic disease (CKD stage IV), Iron and B12 deficiency and GI bleeding  ?-Her 04/2018 Colonoscopy with Dr Carlean Purl showed no evidence of GI bleeding or source of bleeding. Her anemia is probably related to her CKD, however her anemia is out of proportion of her stage IV CKD related anemia.  ?-Her 09/09/18 bone marrow biopsy results showed hypercellular marrow, no evidence of MDS or other concerns.  ?-She has been receiving IV iron as needed, for ferritin <100.  ?-Started receiving monthly B12 injections in our clinic on 05/04/19, currently on every 6 weeks  ?-Her insurance did not approve Aranesp so she was switched to Retacrit every 2 weeks on 11/14/18. Goal is Hg 9-11. Due to worsened anemia, her dose has been increased, currently at 40,000 every 4 weeks. ?-labs reviewed, hgb 9.7. Will proceed with Retacrit as scheduled. ?-Continue Retacrit every 2 weeks with lab (goal Hg 9-11). Will give IV iron if ferritin less than 100.  ?-continue B12 injection every 6 weeks  ?-F/u in 18 weeks  ?  ?2. Breast cancer of the lower inner quadrant of the right breast, invasive  ductal carcinoma with DCIS, pT1bN0Mo, stage IA,   ER+ and PR +, HER 2 negative ?-diagnosed 02/2013, s/p right lumpectomy, adjuvant radiation and Anastrozole for 4 years until 07/2017.  Anastrozole was stopped after she developed septic shock from pneumonia and a cardiac arrest, and we decided not restart due to the risk of cardiac toxicity. ?-Her 07/11/20 Mammogram showed suspicious right breast mass. She underwent breast biopsy on 07/28/20 which showed fibroadipose and necrosis, no malignancy. This area is palpable on exam today. We will continue to monitor. ?-she is clinically doing well. Next mammogram scheduled for 07/12/21. ?  ?3. Comorbidities: DM, HTN, CAD; Chronic renal insufficieny, CKD stage IV ?-Continue medications managed by PCP and Dr. Johnsie Cancel the cardiologist.  ?-She follows up with Dr. Hollie Salk for her kidney function. ?-She was hospitalized for CHF twice in 07/2020. ?  ?4. Genetics results negative  ?  ?5. Bone Health, Low Back pain, from degenerative arthritis and spinal stenosis ?-DEXA from 08/08/20 was normal ?-Her VitD level is low. She will continue 1000u daily.  ?-back pain is tolerable and stable. Continue f/u with Orthopedics  ?  ?6. Left Renal Vein Thrombosis in 07/2017 ?-Likely provoked from acute illness and prior SBO ?-She stopped Xarelto in 01/2018 ?-Her hypercoagulopathy work-up in November 2019 was negative ?  ?7. History of pneumonia and cardiac arrest in 07/2017 ?  ?8. Covid-19+ 11/01/20, 03/26/20 ?  ?  ?PLAN: ?-Proceed with retacrit and B12 today  ?-Lab and Retacrit every 2 weeks and B12 every 6 weeks, will give IV Iron if Ferritin less than 100.  ?-mammogram scheduled for  07/12/21 ?-F/u in 18 weeks ? ? ?No problem-specific Assessment & Plan notes found for this encounter. ? ? ?SUMMARY OF ONCOLOGIC HISTORY: ?Oncology History Overview Note  ?Cancer Staging ?Breast cancer of lower-inner quadrant of right female breast ([place]) ?Staging form: Breast, AJCC 7th Edition ?- Clinical: Stage IA (T1b, N0, cM0)  - Unsigned ?Staging comments: Staged at breast conference 03/25/13. ? ?- Pathologic stage from 05/12/2013: Stage IA (T1b, N0, cM0) - Signed by Truitt Merle, MD on 06/21/2016 ? ? ?  ?Breast cancer of lower-inner quadrant of right female breast ([place])  ?02/27/2013 Mammogram  ? Diagnostic mammogram and ultrasound showed a 6 x 5 mm mass at 5:00 position. Axilla was negative by ultrasound. ?  ?03/16/2013 Initial Biopsy  ? Right breast 5:00 position biopsy showed invasive ductal carcinoma, low-grade ?  ?03/16/2013 Receptors her2  ? ER100% positive, PR 100% positive, HER-2 negative, Ki-67 20% ?  ?03/18/2013 Initial Diagnosis  ? Breast cancer of lower-inner quadrant of right female breast [facility]) ?  ?05/12/2013 Surgery  ? Right breast lumpectomy with sentinel lymph node biopsy ?  ?05/12/2013 Pathology Results  ? Right breast lumpectomy showed invasive ductal carcinoma, grade 2, 0.9 cm, 6 sentinel lymph nodes were all negative. Margins were negative. ?  ?05/12/2013 Oncotype testing  ? Oncotype recurrence score 19, intermediate risk, 10 year risk of distant recurrence 12% with tamoxifen for 5 years. Patient declined adjuvant chemotherapy. ?  ?06/02/2013 - 07/16/2013 Radiation Therapy  ? Adjuvant breast radiation with boost 1600Gy ?  ?08/06/2013 - 07/2017 Anti-estrogen oral therapy  ? Arimidex 1 mg daily. Stopped in 07/2017 due to acute illness  ?  ?03/22/2017 Mammogram  ? IMPRESSION: ?No mammographic evidence for malignancy. ?  ?Iron deficiency anemia  ? ? ? ?INTERVAL HISTORY:  ?[name] is here for a follow up of anemia. She was last seen by me on 03/30/21. She presents to the clinic alone. ?She reports she is doing well overall, no new concerns. ?  ?All other systems were reviewed with the patient and are negative. ? ?MEDICAL HISTORY:  ?Past Medical History:  ?Diagnosis Date  ? Abdominal discomfort   ? Chronic N/V/D. Presumptive dx Crohn's dx per elevated p ANCA. Failed Entocort and Pentasa. Sep 2003 - ileocolectomy c anastomosis per Dr  Deon Pilling 2/2 adhesions - path was hegative for Crohns. EGD, Sm bowel follow through (11/03), and an eteroclysis (10/03) were unrevealing. Cuases hypomag and hypocalcemia.  ? Adnexal mass 8/03  ? s/p lap BSO (R ovarian fibroma) & lysis of adhesions  ? Allergy   ? Seasonal  ? Anemia   ? Multifactorial. Baseline HgB 10-11 ish. B12 def - 150 in 3/10. Fe Def - ferritin 35 3/10. Both are being repleted.  ? Breast cancer ([place]) 03/16/13  ? right, 5 o'clock  ? CAD (coronary artery disease) 1996  ? 1996 - PTCA and angioplasty diagonal branch. 2000 - Rotoblator & angiopllasty of diagonal. 2006 - subendocardial AMI, DES to proximal LAD.Marland Kitchen Also had 90% stenosis in distal apical LAD. EF 55 with apical hypokinesis. Indefinite ASA and Plavix.  ? CHF (congestive heart failure) ([place])   ? Chronic kidney disease   ? Chronic renal insuff baseline Cr 1.2 - 1.4 ish.  ? Chronic kidney disease, stage III (moderate) (H[facility])   ? Chronic renal insuff baseline Cr 1.2 - 1.4 ish. New baseline about 2 (2018)  ? Chronic pain   ? CT 10/10 = Spinal stenosis L2 - S1.  ? COVID-19 11/02/2020  ? Diabetes mellitus   ?  Insulin dependent  ? Diabetes mellitus type 2 in obese ([place]) 05/03/2010  ? Managed on lantus and novolog. Has diabetic nephropathy. Metformin D/C'd 2012 2/2 creatinine.  No diabetic retinopathy per 3/11.   ? Gout   ? Hx of radiation therapy 06/02/13- 07/16/13  ? right rbeast 4500 cGy 25 sessions, right breast boost 1600 cGy in 8 sessions  ? Hyperlipidemia   ? Managed with both a statin and Welchol. Welchol stopped 2014 2/2 cost and started on fenofibrate   ? Hypertension   ? 2006 B renal arteries patent. 2003 MRA - no RAS. 2003 pheo W/U Dr Hassell Done reportedly negative.  ? Hypoxia 07/23/2017  ? Lupus ([place])   ? Lymphedema of breast   ? Personal history of radiation therapy 2015  ? Pneumonia of left lung due to infectious organism   ? RBBB   ? Renal vein thrombosis ([place])   ? SBO (small bowel obstruction) ([place]) 09/17/2017  ? Secondary hyperparathyroidism  ([place])   ? Stroke [facility])   ? Incidental finding MRI 2002 L lacunar infarct  ? Vitamin B12 deficiency   ? Vitamin D deficiency   ? Wears dentures   ? top  ? ? ?SURGICAL HISTORY: ?Past Surgical History:  ?P

## 2021-06-22 NOTE — Patient Instructions (Signed)
Epoetin Alfa injection ?What is this medication? ?EPOETIN ALFA (e POE e tin AL fa) helps your body make more red blood cells. This medicine is used to treat anemia caused by chronic kidney disease, cancer chemotherapy, or HIV-therapy. It may also be used before surgery if you have anemia. ?This medicine may be used for other purposes; ask your health care provider or pharmacist if you have questions. ?COMMON BRAND NAME(S): Epogen, Procrit, Retacrit ?What should I tell my care team before I take this medication? ?They need to know if you have any of these conditions: ?cancer ?heart disease ?high blood pressure ?history of blood clots ?history of stroke ?low levels of folate, iron, or vitamin B12 in the blood ?seizures ?an unusual or allergic reaction to erythropoietin, albumin, benzyl alcohol, hamster proteins, other medicines, foods, dyes, or preservatives ?pregnant or trying to get pregnant ?breast-feeding ?How should I use this medication? ?This medicine is for injection into a vein or under the skin. It is usually given by a health care professional in a hospital or clinic setting. ?If you get this medicine at home, you will be taught how to prepare and give this medicine. Use exactly as directed. Take your medicine at regular intervals. Do not take your medicine more often than directed. ?It is important that you put your used needles and syringes in a special sharps container. Do not put them in a trash can. If you do not have a sharps container, call your pharmacist or healthcare provider to get one. ?A special MedGuide will be given to you by the pharmacist with each prescription and refill. Be sure to read this information carefully each time. ?Talk to your pediatrician regarding the use of this medicine in children. While this drug may be prescribed for selected conditions, precautions do apply. ?Overdosage: If you think you have taken too much of this medicine contact a poison control center or emergency  room at once. ?NOTE: This medicine is only for you. Do not share this medicine with others. ?What if I miss a dose? ?If you miss a dose, take it as soon as you can. If it is almost time for your next dose, take only that dose. Do not take double or extra doses. ?What may interact with this medication? ?Interactions have not been studied. ?This list may not describe all possible interactions. Give your health care provider a list of all the medicines, herbs, non-prescription drugs, or dietary supplements you use. Also tell them if you smoke, drink alcohol, or use illegal drugs. Some items may interact with your medicine. ?What should I watch for while using this medication? ?Your condition will be monitored carefully while you are receiving this medicine. ?You may need blood work done while you are taking this medicine. ?This medicine may cause a decrease in vitamin B6. You should make sure that you get enough vitamin B6 while you are taking this medicine. Discuss the foods you eat and the vitamins you take with your health care professional. ?What side effects may I notice from receiving this medication? ?Side effects that you should report to your doctor or health care professional as soon as possible: ?allergic reactions like skin rash, itching or hives, swelling of the face, lips, or tongue ?seizures ?signs and symptoms of a blood clot such as breathing problems; changes in vision; chest pain; severe, sudden headache; pain, swelling, warmth in the leg; trouble speaking; sudden numbness or weakness of the face, arm or leg ?signs and symptoms of a stroke like  changes in vision; confusion; trouble speaking or understanding; severe headaches; sudden numbness or weakness of the face, arm or leg; trouble walking; dizziness; loss of balance or coordination ?Side effects that usually do not require medical attention (report to your doctor or health care professional if they continue or are  bothersome): ?chills ?cough ?dizziness ?fever ?headaches ?joint pain ?muscle cramps ?muscle pain ?nausea, vomiting ?pain, redness, or irritation at site where injected ?This list may not describe all possible side effects. Call your doctor for medical advice about side effects. You may report side effects to FDA at 1-800-FDA-1088. ?Where should I keep my medication? ?Keep out of the reach of children. ?Store in a refrigerator between 2 and 8 degrees C (36 and 46 degrees F). Do not freeze or shake. Throw away any unused portion if using a single-dose vial. Multi-dose vials can be kept in the refrigerator for up to 21 days after the initial dose. Throw away unused medicine. ?NOTE: This sheet is a summary. It may not cover all possible information. If you have questions about this medicine, talk to your doctor, pharmacist, or health care provider. ? ?Vitamin B12 Deficiency ?Vitamin B12 deficiency occurs when the body does not have enough vitamin B12, which is an important vitamin. The body needs this vitamin: ?To make red blood cells. ?To make DNA. This is the genetic material inside cells. ?To help the nerves work properly so they can carry messages from the brain to the body. ?Vitamin B12 deficiency can cause various health problems, such as a low red blood cell count (anemia) or nerve damage. ?What are the causes? ?This condition may be caused by: ?Not eating enough foods that contain vitamin B12. ?Not having enough stomach acid and digestive fluids to properly absorb vitamin B12 from the food that you eat. ?Certain digestive system diseases that make it hard to absorb vitamin B12. These diseases include Crohn's disease, chronic pancreatitis, and cystic fibrosis. ?A condition in which the body does not make enough of a protein (intrinsic factor), resulting in too few red blood cells (pernicious anemia). ?Having a surgery in which part of the stomach or small intestine is removed. ?Taking certain medicines that make  it hard for the body to absorb vitamin B12. These medicines include: ?Heartburn medicines (antacids and proton pump inhibitors). ?Certain antibiotic medicines. ?Some medicines that are used to treat diabetes, tuberculosis, gout, or high cholesterol. ?What increases the risk? ?The following factors may make you more likely to develop a B12 deficiency: ?Being older than age 19. ?Eating a vegetarian or vegan diet, especially while you are pregnant. ?Eating a poor diet while you are pregnant. ?Taking certain medicines. ?Having alcoholism. ?What are the signs or symptoms? ?In some cases, there are no symptoms of this condition. If the condition leads to anemia or nerve damage, various symptoms can occur, such as: ?Weakness. ?Fatigue. ?Loss of appetite. ?Weight loss. ?Numbness or tingling in your hands and feet. ?Redness and burning of the tongue. ?Confusion or memory problems. ?Depression. ?Sensory problems, such as color blindness, ringing in the ears, or loss of taste. ?Diarrhea or constipation. ?Trouble walking. ?If anemia is severe, symptoms can include: ?Shortness of breath. ?Dizziness. ?Rapid heart rate (tachycardia). ?How is this diagnosed? ?This condition may be diagnosed with a blood test to measure the level of vitamin B12 in your blood. You may also have other tests, including: ?A group of tests that measure certain characteristics of blood cells (complete blood count, CBC). ?A blood test to measure intrinsic factor. ?A  procedure where a thin tube with a camera on the end is used to look into your stomach or intestines (endoscopy). ?Other tests may be needed to discover the cause of B12 deficiency. ?How is this treated? ?Treatment for this condition depends on the cause. This condition may be treated by: ?Changing your eating and drinking habits, such as: ?Eating more foods that contain vitamin B12. ?Drinking less alcohol or no alcohol. ?Getting vitamin B12 injections. ?Taking vitamin B12 supplements. Your  health care provider will tell you which dosage is best for you. ?Follow these instructions at home: ?Eating and drinking ? ?Eat lots of healthy foods that contain vitamin B12, including: ?Meats and poultry. This includes beef, pork, c

## 2021-06-23 LAB — FERRITIN: Ferritin: 36 ng/mL (ref 11–307)

## 2021-06-26 ENCOUNTER — Telehealth: Payer: Self-pay | Admitting: Hematology

## 2021-06-26 NOTE — Telephone Encounter (Signed)
Left message with follow-up appointments per 4/6 los. Mailed calendar. ?

## 2021-07-04 ENCOUNTER — Other Ambulatory Visit: Payer: Self-pay

## 2021-07-04 ENCOUNTER — Encounter: Payer: Self-pay | Admitting: Hematology

## 2021-07-06 ENCOUNTER — Inpatient Hospital Stay: Payer: Medicare Other

## 2021-07-06 ENCOUNTER — Other Ambulatory Visit: Payer: Self-pay

## 2021-07-06 VITALS — BP 161/64 | HR 87 | Temp 99.2°F | Resp 18

## 2021-07-06 DIAGNOSIS — D509 Iron deficiency anemia, unspecified: Secondary | ICD-10-CM

## 2021-07-06 DIAGNOSIS — N184 Chronic kidney disease, stage 4 (severe): Secondary | ICD-10-CM | POA: Diagnosis not present

## 2021-07-06 DIAGNOSIS — D631 Anemia in chronic kidney disease: Secondary | ICD-10-CM | POA: Diagnosis not present

## 2021-07-06 DIAGNOSIS — E538 Deficiency of other specified B group vitamins: Secondary | ICD-10-CM | POA: Diagnosis not present

## 2021-07-06 DIAGNOSIS — D464 Refractory anemia, unspecified: Secondary | ICD-10-CM

## 2021-07-06 LAB — IRON AND IRON BINDING CAPACITY (CC-WL,HP ONLY)
Iron: 36 ug/dL (ref 28–170)
Saturation Ratios: 10 % — ABNORMAL LOW (ref 10.4–31.8)
TIBC: 365 ug/dL (ref 250–450)
UIBC: 329 ug/dL

## 2021-07-06 LAB — CBC WITH DIFFERENTIAL (CANCER CENTER ONLY)
Abs Immature Granulocytes: 0.03 10*3/uL (ref 0.00–0.07)
Basophils Absolute: 0 10*3/uL (ref 0.0–0.1)
Basophils Relative: 0 %
Eosinophils Absolute: 0 10*3/uL (ref 0.0–0.5)
Eosinophils Relative: 0 %
HCT: 31.6 % — ABNORMAL LOW (ref 36.0–46.0)
Hemoglobin: 10 g/dL — ABNORMAL LOW (ref 12.0–15.0)
Immature Granulocytes: 0 %
Lymphocytes Relative: 21 %
Lymphs Abs: 1.5 10*3/uL (ref 0.7–4.0)
MCH: 25.8 pg — ABNORMAL LOW (ref 26.0–34.0)
MCHC: 31.6 g/dL (ref 30.0–36.0)
MCV: 81.4 fL (ref 80.0–100.0)
Monocytes Absolute: 0.5 10*3/uL (ref 0.1–1.0)
Monocytes Relative: 8 %
Neutro Abs: 4.8 10*3/uL (ref 1.7–7.7)
Neutrophils Relative %: 71 %
Platelet Count: 280 10*3/uL (ref 150–400)
RBC: 3.88 MIL/uL (ref 3.87–5.11)
RDW: 17.1 % — ABNORMAL HIGH (ref 11.5–15.5)
WBC Count: 6.9 10*3/uL (ref 4.0–10.5)
nRBC: 0 % (ref 0.0–0.2)

## 2021-07-06 MED ORDER — EPOETIN ALFA-EPBX 40000 UNIT/ML IJ SOLN
40000.0000 [IU] | Freq: Once | INTRAMUSCULAR | Status: AC
  Administered 2021-07-06: 40000 [IU] via SUBCUTANEOUS
  Filled 2021-07-06: qty 1

## 2021-07-06 NOTE — Patient Instructions (Signed)
Epoetin Alfa injection ?What is this medication? ?EPOETIN ALFA (e POE e tin AL fa) helps your body make more red blood cells. This medicine is used to treat anemia caused by chronic kidney disease, cancer chemotherapy, or HIV-therapy. It may also be used before surgery if you have anemia. ?This medicine may be used for other purposes; ask your health care provider or pharmacist if you have questions. ?COMMON BRAND NAME(S): Epogen, Procrit, Retacrit ?What should I tell my care team before I take this medication? ?They need to know if you have any of these conditions: ?cancer ?heart disease ?high blood pressure ?history of blood clots ?history of stroke ?low levels of folate, iron, or vitamin B12 in the blood ?seizures ?an unusual or allergic reaction to erythropoietin, albumin, benzyl alcohol, hamster proteins, other medicines, foods, dyes, or preservatives ?pregnant or trying to get pregnant ?breast-feeding ?How should I use this medication? ?This medicine is for injection into a vein or under the skin. It is usually given by a health care professional in a hospital or clinic setting. ?If you get this medicine at home, you will be taught how to prepare and give this medicine. Use exactly as directed. Take your medicine at regular intervals. Do not take your medicine more often than directed. ?It is important that you put your used needles and syringes in a special sharps container. Do not put them in a trash can. If you do not have a sharps container, call your pharmacist or healthcare provider to get one. ?A special MedGuide will be given to you by the pharmacist with each prescription and refill. Be sure to read this information carefully each time. ?Talk to your pediatrician regarding the use of this medicine in children. While this drug may be prescribed for selected conditions, precautions do apply. ?Overdosage: If you think you have taken too much of this medicine contact a poison control center or emergency  room at once. ?NOTE: This medicine is only for you. Do not share this medicine with others. ?What if I miss a dose? ?If you miss a dose, take it as soon as you can. If it is almost time for your next dose, take only that dose. Do not take double or extra doses. ?What may interact with this medication? ?Interactions have not been studied. ?This list may not describe all possible interactions. Give your health care provider a list of all the medicines, herbs, non-prescription drugs, or dietary supplements you use. Also tell them if you smoke, drink alcohol, or use illegal drugs. Some items may interact with your medicine. ?What should I watch for while using this medication? ?Your condition will be monitored carefully while you are receiving this medicine. ?You may need blood work done while you are taking this medicine. ?This medicine may cause a decrease in vitamin B6. You should make sure that you get enough vitamin B6 while you are taking this medicine. Discuss the foods you eat and the vitamins you take with your health care professional. ?What side effects may I notice from receiving this medication? ?Side effects that you should report to your doctor or health care professional as soon as possible: ?allergic reactions like skin rash, itching or hives, swelling of the face, lips, or tongue ?seizures ?signs and symptoms of a blood clot such as breathing problems; changes in vision; chest pain; severe, sudden headache; pain, swelling, warmth in the leg; trouble speaking; sudden numbness or weakness of the face, arm or leg ?signs and symptoms of a stroke like   changes in vision; confusion; trouble speaking or understanding; severe headaches; sudden numbness or weakness of the face, arm or leg; trouble walking; dizziness; loss of balance or coordination ?Side effects that usually do not require medical attention (report to your doctor or health care professional if they continue or are  bothersome): ?chills ?cough ?dizziness ?fever ?headaches ?joint pain ?muscle cramps ?muscle pain ?nausea, vomiting ?pain, redness, or irritation at site where injected ?This list may not describe all possible side effects. Call your doctor for medical advice about side effects. You may report side effects to FDA at 1-800-FDA-1088. ?Where should I keep my medication? ?Keep out of the reach of children. ?Store in a refrigerator between 2 and 8 degrees C (36 and 46 degrees F). Do not freeze or shake. Throw away any unused portion if using a single-dose vial. Multi-dose vials can be kept in the refrigerator for up to 21 days after the initial dose. Throw away unused medicine. ?NOTE: This sheet is a summary. It may not cover all possible information. If you have questions about this medicine, talk to your doctor, pharmacist, or health care provider. ?? 2023 [employer] Standard (2016-11-06 00:00:00) ? ?

## 2021-07-12 ENCOUNTER — Ambulatory Visit: Payer: Medicare Other

## 2021-07-20 ENCOUNTER — Inpatient Hospital Stay: Payer: Medicare Other | Attending: Hematology

## 2021-07-20 ENCOUNTER — Inpatient Hospital Stay: Payer: Medicare Other

## 2021-07-20 ENCOUNTER — Other Ambulatory Visit: Payer: Self-pay

## 2021-07-20 DIAGNOSIS — D631 Anemia in chronic kidney disease: Secondary | ICD-10-CM | POA: Diagnosis not present

## 2021-07-20 DIAGNOSIS — D509 Iron deficiency anemia, unspecified: Secondary | ICD-10-CM

## 2021-07-20 DIAGNOSIS — D464 Refractory anemia, unspecified: Secondary | ICD-10-CM

## 2021-07-20 DIAGNOSIS — E538 Deficiency of other specified B group vitamins: Secondary | ICD-10-CM | POA: Insufficient documentation

## 2021-07-20 DIAGNOSIS — N184 Chronic kidney disease, stage 4 (severe): Secondary | ICD-10-CM | POA: Diagnosis not present

## 2021-07-20 LAB — CBC WITH DIFFERENTIAL (CANCER CENTER ONLY)
Abs Immature Granulocytes: 0.04 10*3/uL (ref 0.00–0.07)
Basophils Absolute: 0 10*3/uL (ref 0.0–0.1)
Basophils Relative: 0 %
Eosinophils Absolute: 0 10*3/uL (ref 0.0–0.5)
Eosinophils Relative: 0 %
HCT: 36.9 % (ref 36.0–46.0)
Hemoglobin: 11.2 g/dL — ABNORMAL LOW (ref 12.0–15.0)
Immature Granulocytes: 1 %
Lymphocytes Relative: 15 %
Lymphs Abs: 1.3 10*3/uL (ref 0.7–4.0)
MCH: 24.6 pg — ABNORMAL LOW (ref 26.0–34.0)
MCHC: 30.4 g/dL (ref 30.0–36.0)
MCV: 81.1 fL (ref 80.0–100.0)
Monocytes Absolute: 0.7 10*3/uL (ref 0.1–1.0)
Monocytes Relative: 9 %
Neutro Abs: 6.3 10*3/uL (ref 1.7–7.7)
Neutrophils Relative %: 75 %
Platelet Count: 365 10*3/uL (ref 150–400)
RBC: 4.55 MIL/uL (ref 3.87–5.11)
RDW: 16.3 % — ABNORMAL HIGH (ref 11.5–15.5)
WBC Count: 8.4 10*3/uL (ref 4.0–10.5)
nRBC: 0 % (ref 0.0–0.2)

## 2021-07-20 LAB — IRON AND IRON BINDING CAPACITY (CC-WL,HP ONLY)
Iron: 33 ug/dL (ref 28–170)
Saturation Ratios: 9 % — ABNORMAL LOW (ref 10.4–31.8)
TIBC: 386 ug/dL (ref 250–450)
UIBC: 353 ug/dL (ref 148–442)

## 2021-07-20 NOTE — Progress Notes (Signed)
Patients lab results were above the range dictated by her MD her hg was 11.2 and her injection was held as per her MD note. ?

## 2021-07-30 ENCOUNTER — Other Ambulatory Visit: Payer: Self-pay | Admitting: Hematology

## 2021-08-03 ENCOUNTER — Inpatient Hospital Stay: Payer: Medicare Other

## 2021-08-07 ENCOUNTER — Inpatient Hospital Stay: Payer: Medicare Other

## 2021-08-10 ENCOUNTER — Inpatient Hospital Stay: Payer: Medicare Other

## 2021-08-10 ENCOUNTER — Other Ambulatory Visit: Payer: Self-pay

## 2021-08-10 VITALS — BP 123/63 | HR 86 | Temp 98.7°F | Resp 18

## 2021-08-10 DIAGNOSIS — D464 Refractory anemia, unspecified: Secondary | ICD-10-CM

## 2021-08-10 DIAGNOSIS — D509 Iron deficiency anemia, unspecified: Secondary | ICD-10-CM

## 2021-08-10 DIAGNOSIS — E538 Deficiency of other specified B group vitamins: Secondary | ICD-10-CM | POA: Diagnosis not present

## 2021-08-10 DIAGNOSIS — D631 Anemia in chronic kidney disease: Secondary | ICD-10-CM | POA: Diagnosis not present

## 2021-08-10 DIAGNOSIS — N184 Chronic kidney disease, stage 4 (severe): Secondary | ICD-10-CM | POA: Diagnosis not present

## 2021-08-10 LAB — CBC WITH DIFFERENTIAL (CANCER CENTER ONLY)
Abs Immature Granulocytes: 0.03 10*3/uL (ref 0.00–0.07)
Basophils Absolute: 0 10*3/uL (ref 0.0–0.1)
Basophils Relative: 1 %
Eosinophils Absolute: 0 10*3/uL (ref 0.0–0.5)
Eosinophils Relative: 0 %
HCT: 34.5 % — ABNORMAL LOW (ref 36.0–46.0)
Hemoglobin: 10.8 g/dL — ABNORMAL LOW (ref 12.0–15.0)
Immature Granulocytes: 0 %
Lymphocytes Relative: 18 %
Lymphs Abs: 1.4 10*3/uL (ref 0.7–4.0)
MCH: 24.9 pg — ABNORMAL LOW (ref 26.0–34.0)
MCHC: 31.3 g/dL (ref 30.0–36.0)
MCV: 79.7 fL — ABNORMAL LOW (ref 80.0–100.0)
Monocytes Absolute: 0.6 10*3/uL (ref 0.1–1.0)
Monocytes Relative: 7 %
Neutro Abs: 5.7 10*3/uL (ref 1.7–7.7)
Neutrophils Relative %: 74 %
Platelet Count: 279 10*3/uL (ref 150–400)
RBC: 4.33 MIL/uL (ref 3.87–5.11)
RDW: 16.2 % — ABNORMAL HIGH (ref 11.5–15.5)
WBC Count: 7.7 10*3/uL (ref 4.0–10.5)
nRBC: 0 % (ref 0.0–0.2)

## 2021-08-10 MED ORDER — CYANOCOBALAMIN 1000 MCG/ML IJ SOLN
1000.0000 ug | Freq: Once | INTRAMUSCULAR | Status: AC
  Administered 2021-08-10: 1000 ug via INTRAMUSCULAR
  Filled 2021-08-10: qty 1

## 2021-08-10 MED ORDER — EPOETIN ALFA-EPBX 40000 UNIT/ML IJ SOLN
40000.0000 [IU] | Freq: Once | INTRAMUSCULAR | Status: AC
  Administered 2021-08-10: 40000 [IU] via SUBCUTANEOUS
  Filled 2021-08-10: qty 1

## 2021-08-10 NOTE — Patient Instructions (Addendum)
Epoetin Alfa injection What is this medication? EPOETIN ALFA (e POE e tin AL fa) helps your body make more red blood cells. This medicine is used to treat anemia caused by chronic kidney disease, cancer chemotherapy, or HIV-therapy. It may also be used before surgery if you have anemia. This medicine may be used for other purposes; ask your health care provider or pharmacist if you have questions. COMMON BRAND NAME(S): Epogen, Procrit, Retacrit What should I tell my care team before I take this medication? They need to know if you have any of these conditions: cancer heart disease high blood pressure history of blood clots history of stroke low levels of folate, iron, or vitamin B12 in the blood seizures an unusual or allergic reaction to erythropoietin, albumin, benzyl alcohol, hamster proteins, other medicines, foods, dyes, or preservatives pregnant or trying to get pregnant breast-feeding How should I use this medication? This medicine is for injection into a vein or under the skin. It is usually given by a health care professional in a hospital or clinic setting. If you get this medicine at home, you will be taught how to prepare and give this medicine. Use exactly as directed. Take your medicine at regular intervals. Do not take your medicine more often than directed. It is important that you put your used needles and syringes in a special sharps container. Do not put them in a trash can. If you do not have a sharps container, call your pharmacist or healthcare provider to get one. A special MedGuide will be given to you by the pharmacist with each prescription and refill. Be sure to read this information carefully each time. Talk to your pediatrician regarding the use of this medicine in children. While this drug may be prescribed for selected conditions, precautions do apply. Overdosage: If you think you have taken too much of this medicine contact a poison control center or emergency  room at once. NOTE: This medicine is only for you. Do not share this medicine with others. What if I miss a dose? If you miss a dose, take it as soon as you can. If it is almost time for your next dose, take only that dose. Do not take double or extra doses. What may interact with this medication? Interactions have not been studied. This list may not describe all possible interactions. Give your health care provider a list of all the medicines, herbs, non-prescription drugs, or dietary supplements you use. Also tell them if you smoke, drink alcohol, or use illegal drugs. Some items may interact with your medicine. What should I watch for while using this medication? Your condition will be monitored carefully while you are receiving this medicine. You may need blood work done while you are taking this medicine. This medicine may cause a decrease in vitamin B6. You should make sure that you get enough vitamin B6 while you are taking this medicine. Discuss the foods you eat and the vitamins you take with your health care professional. What side effects may I notice from receiving this medication? Side effects that you should report to your doctor or health care professional as soon as possible: allergic reactions like skin rash, itching or hives, swelling of the face, lips, or tongue seizures signs and symptoms of a blood clot such as breathing problems; changes in vision; chest pain; severe, sudden headache; pain, swelling, warmth in the leg; trouble speaking; sudden numbness or weakness of the face, arm or leg signs and symptoms of a stroke like  changes in vision; confusion; trouble speaking or understanding; severe headaches; sudden numbness or weakness of the face, arm or leg; trouble walking; dizziness; loss of balance or coordination Side effects that usually do not require medical attention (report to your doctor or health care professional if they continue or are  bothersome): chills cough dizziness fever headaches joint pain muscle cramps muscle pain nausea, vomiting pain, redness, or irritation at site where injected This list may not describe all possible side effects. Call your doctor for medical advice about side effects. You may report side effects to FDA at 1-800-FDA-1088. Where should I keep my medication? Keep out of the reach of children. Store in a refrigerator between 2 and 8 degrees C (36 and 46 degrees F). Do not freeze or shake. Throw away any unused portion if using a single-dose vial. Multi-dose vials can be kept in the refrigerator for up to 21 days after the initial dose. Throw away unused medicine. NOTE: This sheet is a summary. It may not cover all possible information. If you have questions about this medicine, talk to your doctor, pharmacist, or health care provider.  Vitamin B12 Deficiency Vitamin B12 deficiency occurs when the body does not have enough of this important vitamin. The body needs this vitamin: To make red blood cells. To make DNA. This is the genetic material inside cells. To help the nerves work properly so they can carry messages from the brain to the body. Vitamin B12 deficiency can cause health problems, such as not having enough red blood cells in the blood (anemia). This can lead to nerve damage if untreated. What are the causes? This condition may be caused by: Not eating enough foods that contain vitamin B12. Not having enough stomach acid and digestive fluids to properly absorb vitamin B12 from the food that you eat. Having certain diseases that make it hard to absorb vitamin B12. These diseases include Crohn's disease, chronic pancreatitis, and cystic fibrosis. An autoimmune disorder in which the body does not make enough of a protein (intrinsic factor) within the stomach, resulting in not enough absorption of vitamin B12. Having a surgery in which part of the stomach or small intestine is  removed. Taking certain medicines that make it hard for the body to absorb vitamin B12. These include: Heartburn medicines, such as antacids and proton pump inhibitors. Some medicines that are used to treat diabetes. What increases the risk? The following factors may make you more likely to develop a vitamin B12 deficiency: Being an older adult. Eating a vegetarian or vegan diet that does not include any foods that come from animals. Eating a poor diet while you are pregnant. Taking certain medicines. Having alcoholism. What are the signs or symptoms? In some cases, there are no symptoms of this condition. If the condition leads to anemia or nerve damage, various symptoms may occur, such as: Weakness. Tiredness (fatigue). Loss of appetite. Numbness or tingling in your hands and feet. Redness and burning of the tongue. Depression, confusion, or memory problems. Trouble walking. If anemia is severe, symptoms can include: Shortness of breath. Dizziness. Rapid heart rate. How is this diagnosed? This condition may be diagnosed with a blood test to measure the level of vitamin B12 in your blood. You may also have other tests, including: A group of tests that measure certain characteristics of blood cells (complete blood count, CBC). A blood test to measure intrinsic factor. A procedure where a thin tube with a camera on the end is used to look  into your stomach or intestines (endoscopy). Other tests may be needed to discover the cause of the deficiency. How is this treated? Treatment for this condition depends on the cause. This condition may be treated by: Changing your eating and drinking habits, such as: Eating more foods that contain vitamin B12. Drinking less alcohol or no alcohol. Getting vitamin B12 injections. Taking vitamin B12 supplements by mouth (orally). Your health care provider will tell you which dose is best for you. Follow these instructions at home: Eating and  drinking  Include foods in your diet that come from animals and contain a lot of vitamin B12. These include: Meats and poultry. This includes beef, pork, chicken, [place], and organ meats, such as liver. Seafood. This includes clams, rainbow trout, salmon, tuna, and haddock. Eggs. Dairy foods such as milk, yogurt, and cheese. Eat foods that have vitamin B12 added to them (are fortified), such as ready-to-eat breakfast cereals. Check the label on the package to see if a food is fortified. The items listed above may not be a complete list of foods and beverages you can eat and drink. Contact a dietitian for more information. Alcohol use Do not drink alcohol if: Your health care provider tells you not to drink. You are pregnant, may be pregnant, or are planning to become pregnant. If you drink alcohol: Limit how much you have to: 0-1 drink a day for women. 0-2 drinks a day for men. Know how much alcohol is in your drink. In the [place] one drink equals one 12 oz bottle of beer (355 mL), one 5 oz glass of wine (148 mL), or one 1 oz glass of hard liquor (44 mL). General instructions Get vitamin B12 injections if told to by your health care provider. Take supplements only as told by your health care provider. Follow the directions carefully. Keep all follow-up visits. This is important. Contact a health care provider if: Your symptoms come back. Your symptoms get worse or do not improve with treatment. Get help right away: You develop shortness of breath. You have a rapid heart rate. You have chest pain. You become dizzy or you faint. These symptoms may be an emergency. Get help right away. Call 911. Do not wait to see if the symptoms will go away. Do not drive yourself to the hospital. Summary Vitamin B12 deficiency occurs when the body does not have enough of this important vitamin. Common causes include not eating enough foods that contain vitamin B12, not being able to absorb vitamin  B12 from the food that you eat, having a surgery in which part of the stomach or small intestine is removed, or taking certain medicines. Eat foods that have vitamin B12 in them. Treatment may include making a change in the way you eat and drink, getting vitamin B12 injections, or taking vitamin B12 supplements. This information is not intended to replace advice given to you by your health care provider. Make sure you discuss any questions you have with your health care provider. Document Revised: 10/28/2020 Document Reviewed: 10/28/2020 Elsevier Patient Education  [place].   2023 Elsevier/Gold Standard (2016-11-06 00:00:00)

## 2021-08-16 ENCOUNTER — Encounter: Payer: Self-pay | Admitting: *Deleted

## 2021-08-16 DIAGNOSIS — R197 Diarrhea, unspecified: Secondary | ICD-10-CM | POA: Diagnosis not present

## 2021-08-16 DIAGNOSIS — R634 Abnormal weight loss: Secondary | ICD-10-CM | POA: Diagnosis not present

## 2021-08-16 NOTE — Progress Notes (Signed)
Pt scheduled tomorrow, 6/1 for retacrit and feraheme.  Per pharmacy, it is contraindicated to give both medications on the same day.  They must be separated by 1 week.  Per last office note on 06/22/21, we will give IV iron if ferritin less than 100.  Last ferritin drawn was 06/22/21.  Reached out to S. Trilby Drummer, RN with Dr Burr Medico to confirm retacrit should be given, new ferritin level will be drawn tomorrow at lab appt, and will schedule separate appt next week for IV iron if ferritin results less than 100.

## 2021-08-17 ENCOUNTER — Inpatient Hospital Stay: Payer: Medicare Other

## 2021-08-17 ENCOUNTER — Inpatient Hospital Stay: Payer: Medicare Other | Attending: Hematology

## 2021-08-17 DIAGNOSIS — N184 Chronic kidney disease, stage 4 (severe): Secondary | ICD-10-CM | POA: Insufficient documentation

## 2021-08-17 DIAGNOSIS — D631 Anemia in chronic kidney disease: Secondary | ICD-10-CM | POA: Insufficient documentation

## 2021-08-22 ENCOUNTER — Other Ambulatory Visit: Payer: Self-pay | Admitting: Hematology

## 2021-08-31 ENCOUNTER — Inpatient Hospital Stay: Payer: Medicare Other

## 2021-08-31 ENCOUNTER — Other Ambulatory Visit: Payer: Self-pay

## 2021-08-31 DIAGNOSIS — D464 Refractory anemia, unspecified: Secondary | ICD-10-CM

## 2021-08-31 DIAGNOSIS — D631 Anemia in chronic kidney disease: Secondary | ICD-10-CM | POA: Diagnosis not present

## 2021-08-31 DIAGNOSIS — N184 Chronic kidney disease, stage 4 (severe): Secondary | ICD-10-CM | POA: Diagnosis not present

## 2021-08-31 LAB — CBC WITH DIFFERENTIAL (CANCER CENTER ONLY)
Abs Immature Granulocytes: 0.02 10*3/uL (ref 0.00–0.07)
Basophils Absolute: 0 10*3/uL (ref 0.0–0.1)
Basophils Relative: 0 %
Eosinophils Absolute: 0 10*3/uL (ref 0.0–0.5)
Eosinophils Relative: 0 %
HCT: 32.5 % — ABNORMAL LOW (ref 36.0–46.0)
Hemoglobin: 10.5 g/dL — ABNORMAL LOW (ref 12.0–15.0)
Immature Granulocytes: 0 %
Lymphocytes Relative: 19 %
Lymphs Abs: 1.4 10*3/uL (ref 0.7–4.0)
MCH: 25.2 pg — ABNORMAL LOW (ref 26.0–34.0)
MCHC: 32.3 g/dL (ref 30.0–36.0)
MCV: 77.9 fL — ABNORMAL LOW (ref 80.0–100.0)
Monocytes Absolute: 0.5 10*3/uL (ref 0.1–1.0)
Monocytes Relative: 7 %
Neutro Abs: 5.4 10*3/uL (ref 1.7–7.7)
Neutrophils Relative %: 74 %
Platelet Count: 261 10*3/uL (ref 150–400)
RBC: 4.17 MIL/uL (ref 3.87–5.11)
RDW: 18.1 % — ABNORMAL HIGH (ref 11.5–15.5)
WBC Count: 7.5 10*3/uL (ref 4.0–10.5)
nRBC: 0 % (ref 0.0–0.2)

## 2021-09-02 ENCOUNTER — Other Ambulatory Visit: Payer: Self-pay | Admitting: Cardiovascular Disease

## 2021-09-05 NOTE — Telephone Encounter (Signed)
Left message for patient to call back  

## 2021-09-14 ENCOUNTER — Inpatient Hospital Stay: Payer: Medicare Other

## 2021-09-14 VITALS — BP 152/68 | HR 79 | Temp 98.5°F | Resp 18

## 2021-09-14 DIAGNOSIS — D509 Iron deficiency anemia, unspecified: Secondary | ICD-10-CM

## 2021-09-14 DIAGNOSIS — D631 Anemia in chronic kidney disease: Secondary | ICD-10-CM | POA: Diagnosis not present

## 2021-09-14 DIAGNOSIS — N184 Chronic kidney disease, stage 4 (severe): Secondary | ICD-10-CM | POA: Diagnosis not present

## 2021-09-14 LAB — CBC WITH DIFFERENTIAL (CANCER CENTER ONLY)
Abs Immature Granulocytes: 0.04 10*3/uL (ref 0.00–0.07)
Basophils Absolute: 0 10*3/uL (ref 0.0–0.1)
Basophils Relative: 0 %
Eosinophils Absolute: 0 10*3/uL (ref 0.0–0.5)
Eosinophils Relative: 0 %
HCT: 30.4 % — ABNORMAL LOW (ref 36.0–46.0)
Hemoglobin: 9.8 g/dL — ABNORMAL LOW (ref 12.0–15.0)
Immature Granulocytes: 0 %
Lymphocytes Relative: 15 %
Lymphs Abs: 1.4 10*3/uL (ref 0.7–4.0)
MCH: 25.1 pg — ABNORMAL LOW (ref 26.0–34.0)
MCHC: 32.2 g/dL (ref 30.0–36.0)
MCV: 77.7 fL — ABNORMAL LOW (ref 80.0–100.0)
Monocytes Absolute: 0.6 10*3/uL (ref 0.1–1.0)
Monocytes Relative: 6 %
Neutro Abs: 7.3 10*3/uL (ref 1.7–7.7)
Neutrophils Relative %: 79 %
Platelet Count: 296 10*3/uL (ref 150–400)
RBC: 3.91 MIL/uL (ref 3.87–5.11)
RDW: 19.2 % — ABNORMAL HIGH (ref 11.5–15.5)
WBC Count: 9.4 10*3/uL (ref 4.0–10.5)
nRBC: 0 % (ref 0.0–0.2)

## 2021-09-14 MED ORDER — CYANOCOBALAMIN 1000 MCG/ML IJ SOLN
1000.0000 ug | Freq: Once | INTRAMUSCULAR | Status: AC
  Administered 2021-09-14: 1000 ug via INTRAMUSCULAR
  Filled 2021-09-14: qty 1

## 2021-09-14 MED ORDER — EPOETIN ALFA-EPBX 40000 UNIT/ML IJ SOLN
40000.0000 [IU] | Freq: Once | INTRAMUSCULAR | Status: AC
  Administered 2021-09-14: 40000 [IU] via SUBCUTANEOUS
  Filled 2021-09-14: qty 1

## 2021-09-14 NOTE — Patient Instructions (Signed)
Epoetin Alfa injection What is this medication? EPOETIN ALFA (e POE e tin AL fa) helps your body make more red blood cells. This medicine is used to treat anemia caused by chronic kidney disease, cancer chemotherapy, or HIV-therapy. It may also be used before surgery if you have anemia. This medicine may be used for other purposes; ask your health care provider or pharmacist if you have questions. COMMON BRAND NAME(S): Epogen, Procrit, Retacrit What should I tell my care team before I take this medication? They need to know if you have any of these conditions: cancer heart disease high blood pressure history of blood clots history of stroke low levels of folate, iron, or vitamin B12 in the blood seizures an unusual or allergic reaction to erythropoietin, albumin, benzyl alcohol, hamster proteins, other medicines, foods, dyes, or preservatives pregnant or trying to get pregnant breast-feeding How should I use this medication? This medicine is for injection into a vein or under the skin. It is usually given by a health care professional in a hospital or clinic setting. If you get this medicine at home, you will be taught how to prepare and give this medicine. Use exactly as directed. Take your medicine at regular intervals. Do not take your medicine more often than directed. It is important that you put your used needles and syringes in a special sharps container. Do not put them in a trash can. If you do not have a sharps container, call your pharmacist or healthcare provider to get one. A special MedGuide will be given to you by the pharmacist with each prescription and refill. Be sure to read this information carefully each time. Talk to your pediatrician regarding the use of this medicine in children. While this drug may be prescribed for selected conditions, precautions do apply. Overdosage: If you think you have taken too much of this medicine contact a poison control center or emergency  room at once. NOTE: This medicine is only for you. Do not share this medicine with others. What if I miss a dose? If you miss a dose, take it as soon as you can. If it is almost time for your next dose, take only that dose. Do not take double or extra doses. What may interact with this medication? Interactions have not been studied. This list may not describe all possible interactions. Give your health care provider a list of all the medicines, herbs, non-prescription drugs, or dietary supplements you use. Also tell them if you smoke, drink alcohol, or use illegal drugs. Some items may interact with your medicine. What should I watch for while using this medication? Your condition will be monitored carefully while you are receiving this medicine. You may need blood work done while you are taking this medicine. This medicine may cause a decrease in vitamin B6. You should make sure that you get enough vitamin B6 while you are taking this medicine. Discuss the foods you eat and the vitamins you take with your health care professional. What side effects may I notice from receiving this medication? Side effects that you should report to your doctor or health care professional as soon as possible: allergic reactions like skin rash, itching or hives, swelling of the face, lips, or tongue seizures signs and symptoms of a blood clot such as breathing problems; changes in vision; chest pain; severe, sudden headache; pain, swelling, warmth in the leg; trouble speaking; sudden numbness or weakness of the face, arm or leg signs and symptoms of a stroke like  changes in vision; confusion; trouble speaking or understanding; severe headaches; sudden numbness or weakness of the face, arm or leg; trouble walking; dizziness; loss of balance or coordination Side effects that usually do not require medical attention (report to your doctor or health care professional if they continue or are  bothersome): chills cough dizziness fever headaches joint pain muscle cramps muscle pain nausea, vomiting pain, redness, or irritation at site where injected This list may not describe all possible side effects. Call your doctor for medical advice about side effects. You may report side effects to FDA at 1-800-FDA-1088. Where should I keep my medication? Keep out of the reach of children. Store in a refrigerator between 2 and 8 degrees C (36 and 46 degrees F). Do not freeze or shake. Throw away any unused portion if using a single-dose vial. Multi-dose vials can be kept in the refrigerator for up to 21 days after the initial dose. Throw away unused medicine. NOTE: This sheet is a summary. It may not cover all possible information. If you have questions about this medicine, talk to your doctor, pharmacist, or health care provider.  2023 Elsevier/Gold Standard (2016-11-06 00:00:00) Vitamin B12 Injection What is this medication? Vitamin B12 (VAHY tuh min B12) prevents and treats low vitamin B12 levels in your body. It is used in people who do not get enough vitamin B12 from their diet or when their digestive tract does not absorb enough. Vitamin B12 plays an important role in maintaining the health of your nervous system and red blood cells. This medicine may be used for other purposes; ask your health care provider or pharmacist if you have questions. COMMON BRAND NAME(S): B-12 Compliance Kit, B-12 Injection Kit, Cyomin, Dodex, LA-12, Nutri-Twelve, Physicians EZ Use B-12, Primabalt What should I tell my care team before I take this medication? They need to know if you have any of these conditions: Kidney disease Leber's disease Megaloblastic anemia An unusual or allergic reaction to cyanocobalamin, cobalt, other medications, foods, dyes, or preservatives Pregnant or trying to get pregnant Breast-feeding How should I use this medication? This medication is injected into a muscle or  deeply under the skin. It is usually given in a clinic or care team's office. However, your care team may teach you how to inject yourself. Follow all instructions. Talk to your care team about the use of this medication in children. Special care may be needed. Overdosage: If you think you have taken too much of this medicine contact a poison control center or emergency room at once. NOTE: This medicine is only for you. Do not share this medicine with others. What if I miss a dose? If you are given your dose at a clinic or care team's office, call to reschedule your appointment. If you give your own injections, and you miss a dose, take it as soon as you can. If it is almost time for your next dose, take only that dose. Do not take double or extra doses. What may interact with this medication? Alcohol Colchicine This list may not describe all possible interactions. Give your health care provider a list of all the medicines, herbs, non-prescription drugs, or dietary supplements you use. Also tell them if you smoke, drink alcohol, or use illegal drugs. Some items may interact with your medicine. What should I watch for while using this medication? Visit your care team regularly. You may need blood work done while you are taking this medication. You may need to follow a special diet. Talk  to your care team. Limit your alcohol intake and avoid smoking to get the best benefit. What side effects may I notice from receiving this medication? Side effects that you should report to your care team as soon as possible: Allergic reactions--skin rash, itching, hives, swelling of the face, lips, tongue, or throat Swelling of the ankles, hands, or feet Trouble breathing Side effects that usually do not require medical attention (report to your care team if they continue or are bothersome): Diarrhea This list may not describe all possible side effects. Call your doctor for medical advice about side effects. You  may report side effects to FDA at 1-800-FDA-1088. Where should I keep my medication? Keep out of the reach of children. Store at room temperature between 15 and 30 degrees C (59 and 85 degrees F). Protect from light. Throw away any unused medication after the expiration date. NOTE: This sheet is a summary. It may not cover all possible information. If you have questions about this medicine, talk to your doctor, pharmacist, or health care provider.  2023 Elsevier/Gold Standard (2020-11-15 00:00:00)

## 2021-09-27 ENCOUNTER — Other Ambulatory Visit: Payer: Self-pay

## 2021-09-27 DIAGNOSIS — D509 Iron deficiency anemia, unspecified: Secondary | ICD-10-CM

## 2021-09-27 DIAGNOSIS — D464 Refractory anemia, unspecified: Secondary | ICD-10-CM

## 2021-09-27 DIAGNOSIS — D631 Anemia in chronic kidney disease: Secondary | ICD-10-CM

## 2021-09-28 ENCOUNTER — Inpatient Hospital Stay: Payer: Medicare Other | Attending: Hematology

## 2021-09-28 ENCOUNTER — Other Ambulatory Visit: Payer: Self-pay | Admitting: Hematology

## 2021-09-28 ENCOUNTER — Inpatient Hospital Stay: Payer: Medicare Other

## 2021-09-28 ENCOUNTER — Other Ambulatory Visit: Payer: Self-pay

## 2021-09-28 VITALS — BP 125/62 | HR 78 | Temp 98.6°F | Resp 18

## 2021-09-28 DIAGNOSIS — D631 Anemia in chronic kidney disease: Secondary | ICD-10-CM | POA: Diagnosis not present

## 2021-09-28 DIAGNOSIS — D509 Iron deficiency anemia, unspecified: Secondary | ICD-10-CM

## 2021-09-28 DIAGNOSIS — D464 Refractory anemia, unspecified: Secondary | ICD-10-CM

## 2021-09-28 DIAGNOSIS — N184 Chronic kidney disease, stage 4 (severe): Secondary | ICD-10-CM | POA: Insufficient documentation

## 2021-09-28 LAB — CBC WITH DIFFERENTIAL (CANCER CENTER ONLY)
Abs Immature Granulocytes: 0.04 10*3/uL (ref 0.00–0.07)
Basophils Absolute: 0 10*3/uL (ref 0.0–0.1)
Basophils Relative: 0 %
Eosinophils Absolute: 0 10*3/uL (ref 0.0–0.5)
Eosinophils Relative: 0 %
HCT: 33.5 % — ABNORMAL LOW (ref 36.0–46.0)
Hemoglobin: 10.6 g/dL — ABNORMAL LOW (ref 12.0–15.0)
Immature Granulocytes: 0 %
Lymphocytes Relative: 13 %
Lymphs Abs: 1.3 10*3/uL (ref 0.7–4.0)
MCH: 25.7 pg — ABNORMAL LOW (ref 26.0–34.0)
MCHC: 31.6 g/dL (ref 30.0–36.0)
MCV: 81.1 fL (ref 80.0–100.0)
Monocytes Absolute: 0.7 10*3/uL (ref 0.1–1.0)
Monocytes Relative: 7 %
Neutro Abs: 8.2 10*3/uL — ABNORMAL HIGH (ref 1.7–7.7)
Neutrophils Relative %: 80 %
Platelet Count: 288 10*3/uL (ref 150–400)
RBC: 4.13 MIL/uL (ref 3.87–5.11)
RDW: 21.3 % — ABNORMAL HIGH (ref 11.5–15.5)
WBC Count: 10.3 10*3/uL (ref 4.0–10.5)
nRBC: 0 % (ref 0.0–0.2)

## 2021-09-28 LAB — SAMPLE TO BLOOD BANK

## 2021-09-28 MED ORDER — EPOETIN ALFA-EPBX 40000 UNIT/ML IJ SOLN
40000.0000 [IU] | Freq: Once | INTRAMUSCULAR | Status: AC
  Administered 2021-09-28: 40000 [IU] via SUBCUTANEOUS
  Filled 2021-09-28: qty 1

## 2021-09-28 NOTE — Patient Instructions (Signed)
Epoetin Alfa injection ?What is this medication? ?EPOETIN ALFA (e POE e tin AL fa) helps your body make more red blood cells. This medicine is used to treat anemia caused by chronic kidney disease, cancer chemotherapy, or HIV-therapy. It may also be used before surgery if you have anemia. ?This medicine may be used for other purposes; ask your health care provider or pharmacist if you have questions. ?COMMON BRAND NAME(S): Epogen, Procrit, Retacrit ?What should I tell my care team before I take this medication? ?They need to know if you have any of these conditions: ?cancer ?heart disease ?high blood pressure ?history of blood clots ?history of stroke ?low levels of folate, iron, or vitamin B12 in the blood ?seizures ?an unusual or allergic reaction to erythropoietin, albumin, benzyl alcohol, hamster proteins, other medicines, foods, dyes, or preservatives ?pregnant or trying to get pregnant ?breast-feeding ?How should I use this medication? ?This medicine is for injection into a vein or under the skin. It is usually given by a health care professional in a hospital or clinic setting. ?If you get this medicine at home, you will be taught how to prepare and give this medicine. Use exactly as directed. Take your medicine at regular intervals. Do not take your medicine more often than directed. ?It is important that you put your used needles and syringes in a special sharps container. Do not put them in a trash can. If you do not have a sharps container, call your pharmacist or healthcare provider to get one. ?A special MedGuide will be given to you by the pharmacist with each prescription and refill. Be sure to read this information carefully each time. ?Talk to your pediatrician regarding the use of this medicine in children. While this drug may be prescribed for selected conditions, precautions do apply. ?Overdosage: If you think you have taken too much of this medicine contact a poison control center or emergency  room at once. ?NOTE: This medicine is only for you. Do not share this medicine with others. ?What if I miss a dose? ?If you miss a dose, take it as soon as you can. If it is almost time for your next dose, take only that dose. Do not take double or extra doses. ?What may interact with this medication? ?Interactions have not been studied. ?This list may not describe all possible interactions. Give your health care provider a list of all the medicines, herbs, non-prescription drugs, or dietary supplements you use. Also tell them if you smoke, drink alcohol, or use illegal drugs. Some items may interact with your medicine. ?What should I watch for while using this medication? ?Your condition will be monitored carefully while you are receiving this medicine. ?You may need blood work done while you are taking this medicine. ?This medicine may cause a decrease in vitamin B6. You should make sure that you get enough vitamin B6 while you are taking this medicine. Discuss the foods you eat and the vitamins you take with your health care professional. ?What side effects may I notice from receiving this medication? ?Side effects that you should report to your doctor or health care professional as soon as possible: ?allergic reactions like skin rash, itching or hives, swelling of the face, lips, or tongue ?seizures ?signs and symptoms of a blood clot such as breathing problems; changes in vision; chest pain; severe, sudden headache; pain, swelling, warmth in the leg; trouble speaking; sudden numbness or weakness of the face, arm or leg ?signs and symptoms of a stroke like   changes in vision; confusion; trouble speaking or understanding; severe headaches; sudden numbness or weakness of the face, arm or leg; trouble walking; dizziness; loss of balance or coordination ?Side effects that usually do not require medical attention (report to your doctor or health care professional if they continue or are  bothersome): ?chills ?cough ?dizziness ?fever ?headaches ?joint pain ?muscle cramps ?muscle pain ?nausea, vomiting ?pain, redness, or irritation at site where injected ?This list may not describe all possible side effects. Call your doctor for medical advice about side effects. You may report side effects to FDA at 1-800-FDA-1088. ?Where should I keep my medication? ?Keep out of the reach of children. ?Store in a refrigerator between 2 and 8 degrees C (36 and 46 degrees F). Do not freeze or shake. Throw away any unused portion if using a single-dose vial. Multi-dose vials can be kept in the refrigerator for up to 21 days after the initial dose. Throw away unused medicine. ?NOTE: This sheet is a summary. It may not cover all possible information. If you have questions about this medicine, talk to your doctor, pharmacist, or health care provider. ?? 2023 [employer] Standard (2016-11-06 00:00:00) ? ?

## 2021-10-09 ENCOUNTER — Other Ambulatory Visit: Payer: Self-pay | Admitting: Hematology

## 2021-10-09 ENCOUNTER — Other Ambulatory Visit: Payer: Self-pay | Admitting: Pharmacist

## 2021-10-12 ENCOUNTER — Inpatient Hospital Stay: Payer: Medicare Other

## 2021-10-19 ENCOUNTER — Emergency Department (HOSPITAL_BASED_OUTPATIENT_CLINIC_OR_DEPARTMENT_OTHER): Payer: Medicare Other

## 2021-10-19 ENCOUNTER — Encounter (HOSPITAL_COMMUNITY): Payer: Self-pay

## 2021-10-19 ENCOUNTER — Observation Stay (HOSPITAL_BASED_OUTPATIENT_CLINIC_OR_DEPARTMENT_OTHER)
Admission: EM | Admit: 2021-10-19 | Discharge: 2021-10-20 | Disposition: A | Discharge status: Home / Self Care | Payer: Medicare Other | Attending: Internal Medicine | Admitting: Internal Medicine

## 2021-10-19 ENCOUNTER — Encounter (HOSPITAL_BASED_OUTPATIENT_CLINIC_OR_DEPARTMENT_OTHER): Payer: Self-pay | Admitting: Emergency Medicine

## 2021-10-19 ENCOUNTER — Other Ambulatory Visit: Payer: Self-pay

## 2021-10-19 DIAGNOSIS — Z955 Presence of coronary angioplasty implant and graft: Secondary | ICD-10-CM | POA: Diagnosis not present

## 2021-10-19 DIAGNOSIS — I130 Hypertensive heart and chronic kidney disease with heart failure and stage 1 through stage 4 chronic kidney disease, or unspecified chronic kidney disease: Secondary | ICD-10-CM | POA: Diagnosis not present

## 2021-10-19 DIAGNOSIS — R600 Localized edema: Secondary | ICD-10-CM | POA: Diagnosis not present

## 2021-10-19 DIAGNOSIS — N179 Acute kidney failure, unspecified: Secondary | ICD-10-CM | POA: Diagnosis not present

## 2021-10-19 DIAGNOSIS — E1159 Type 2 diabetes mellitus with other circulatory complications: Secondary | ICD-10-CM | POA: Diagnosis present

## 2021-10-19 DIAGNOSIS — E1122 Type 2 diabetes mellitus with diabetic chronic kidney disease: Secondary | ICD-10-CM | POA: Diagnosis not present

## 2021-10-19 DIAGNOSIS — Z862 Personal history of diseases of the blood and blood-forming organs and certain disorders involving the immune mechanism: Secondary | ICD-10-CM | POA: Diagnosis present

## 2021-10-19 DIAGNOSIS — E785 Hyperlipidemia, unspecified: Secondary | ICD-10-CM | POA: Diagnosis not present

## 2021-10-19 DIAGNOSIS — I152 Hypertension secondary to endocrine disorders: Secondary | ICD-10-CM | POA: Diagnosis present

## 2021-10-19 DIAGNOSIS — Z7901 Long term (current) use of anticoagulants: Secondary | ICD-10-CM | POA: Diagnosis not present

## 2021-10-19 DIAGNOSIS — E1169 Type 2 diabetes mellitus with other specified complication: Secondary | ICD-10-CM | POA: Diagnosis not present

## 2021-10-19 DIAGNOSIS — Z86718 Personal history of other venous thrombosis and embolism: Secondary | ICD-10-CM | POA: Diagnosis not present

## 2021-10-19 DIAGNOSIS — D631 Anemia in chronic kidney disease: Secondary | ICD-10-CM | POA: Diagnosis present

## 2021-10-19 DIAGNOSIS — I2510 Atherosclerotic heart disease of native coronary artery without angina pectoris: Secondary | ICD-10-CM | POA: Diagnosis not present

## 2021-10-19 DIAGNOSIS — I872 Venous insufficiency (chronic) (peripheral): Secondary | ICD-10-CM | POA: Insufficient documentation

## 2021-10-19 DIAGNOSIS — E119 Type 2 diabetes mellitus without complications: Secondary | ICD-10-CM | POA: Diagnosis present

## 2021-10-19 DIAGNOSIS — E8770 Fluid overload, unspecified: Secondary | ICD-10-CM | POA: Insufficient documentation

## 2021-10-19 DIAGNOSIS — I10 Essential (primary) hypertension: Secondary | ICD-10-CM | POA: Diagnosis present

## 2021-10-19 DIAGNOSIS — R531 Weakness: Secondary | ICD-10-CM

## 2021-10-19 DIAGNOSIS — I5032 Chronic diastolic (congestive) heart failure: Secondary | ICD-10-CM | POA: Diagnosis not present

## 2021-10-19 DIAGNOSIS — I5030 Unspecified diastolic (congestive) heart failure: Secondary | ICD-10-CM | POA: Diagnosis present

## 2021-10-19 DIAGNOSIS — N184 Chronic kidney disease, stage 4 (severe): Secondary | ICD-10-CM | POA: Diagnosis not present

## 2021-10-19 DIAGNOSIS — K2950 Unspecified chronic gastritis without bleeding: Secondary | ICD-10-CM | POA: Diagnosis present

## 2021-10-19 DIAGNOSIS — M7989 Other specified soft tissue disorders: Secondary | ICD-10-CM | POA: Diagnosis not present

## 2021-10-19 DIAGNOSIS — Z8673 Personal history of transient ischemic attack (TIA), and cerebral infarction without residual deficits: Secondary | ICD-10-CM | POA: Diagnosis not present

## 2021-10-19 LAB — CBC WITH DIFFERENTIAL/PLATELET
Abs Immature Granulocytes: 0.12 10*3/uL — ABNORMAL HIGH (ref 0.00–0.07)
Basophils Absolute: 0 10*3/uL (ref 0.0–0.1)
Basophils Relative: 0 %
Eosinophils Absolute: 0.1 10*3/uL (ref 0.0–0.5)
Eosinophils Relative: 1 %
HCT: 29.7 % — ABNORMAL LOW (ref 36.0–46.0)
Hemoglobin: 9.3 g/dL — ABNORMAL LOW (ref 12.0–15.0)
Immature Granulocytes: 1 %
Lymphocytes Relative: 11 %
Lymphs Abs: 1.2 10*3/uL (ref 0.7–4.0)
MCH: 26.5 pg (ref 26.0–34.0)
MCHC: 31.3 g/dL (ref 30.0–36.0)
MCV: 84.6 fL (ref 80.0–100.0)
Monocytes Absolute: 0.7 10*3/uL (ref 0.1–1.0)
Monocytes Relative: 7 %
Neutro Abs: 8.9 10*3/uL — ABNORMAL HIGH (ref 1.7–7.7)
Neutrophils Relative %: 80 %
Platelets: 202 10*3/uL (ref 150–400)
RBC: 3.51 MIL/uL — ABNORMAL LOW (ref 3.87–5.11)
RDW: 20.3 % — ABNORMAL HIGH (ref 11.5–15.5)
WBC: 11.1 10*3/uL — ABNORMAL HIGH (ref 4.0–10.5)
nRBC: 0 % (ref 0.0–0.2)

## 2021-10-19 LAB — COMPREHENSIVE METABOLIC PANEL
ALT: 13 U/L (ref 0–44)
AST: 20 U/L (ref 15–41)
Albumin: 3.6 g/dL (ref 3.5–5.0)
Alkaline Phosphatase: 66 U/L (ref 38–126)
Anion gap: 13 (ref 5–15)
BUN: 79 mg/dL — ABNORMAL HIGH (ref 8–23)
CO2: 20 mmol/L — ABNORMAL LOW (ref 22–32)
Calcium: 8.3 mg/dL — ABNORMAL LOW (ref 8.9–10.3)
Chloride: 106 mmol/L (ref 98–111)
Creatinine, Ser: 3.18 mg/dL — ABNORMAL HIGH (ref 0.44–1.00)
GFR, Estimated: 15 mL/min — ABNORMAL LOW (ref >60)
Glucose, Bld: 91 mg/dL (ref 70–99)
Potassium: 3.9 mmol/L (ref 3.5–5.1)
Sodium: 139 mmol/L (ref 135–145)
Total Bilirubin: 0.3 mg/dL (ref 0.3–1.2)
Total Protein: 6.8 g/dL (ref 6.5–8.1)

## 2021-10-19 LAB — TROPONIN I (HIGH SENSITIVITY)
Troponin I (High Sensitivity): 20 ng/L — ABNORMAL HIGH (ref <18)
Troponin I (High Sensitivity): 20 ng/L — ABNORMAL HIGH (ref <18)

## 2021-10-19 LAB — BRAIN NATRIURETIC PEPTIDE: B Natriuretic Peptide: 338.1 pg/mL — ABNORMAL HIGH (ref 0.0–100.0)

## 2021-10-19 MED ORDER — FUROSEMIDE 10 MG/ML IJ SOLN
40.0000 mg | Freq: Once | INTRAMUSCULAR | Status: AC
Start: 2021-10-19 — End: 2021-10-19
  Administered 2021-10-19: 40 mg via INTRAVENOUS
  Filled 2021-10-19: qty 4

## 2021-10-19 NOTE — H&P (Signed)
History and Physical    [name] ZSW:[id number] DOB: 10-23-1945 DOA: 10/19/2021  PCP: Seward Carol, MD  Patient coming from: Home via [place]  I have personally briefly reviewed patient's old medical records in [place]  Chief Complaint: Lower extremity edema  HPI: [name] is a 76 y.o. female with medical history significant for CKD stage IV, chronic diastolic CHF (EF 32-20% by TTE 06/2020), CAD (s/p PTCA 1996, DES to pLAD 2006), Hx of DVT on Eliquis, history of CVA, T2DM, HTN, HLD, anemia of CKD and iron deficiency, chronic gastritis who presented to the ED for evaluation of lower extremity swelling.  Patient reports 3 days of progressive lower extremity edema.  She has had associated exertional dyspnea.  She denies any chest pain, cough, nausea, vomiting.  She reports chronic intermittent abdominal pain and loose stools unchanged from baseline.  She reports fair urine output at home but feels like sometimes she has decreased output than expected.  She says she ran out of her Eliquis 2 weeks ago.  On admitting evaluation, patient states that she has had increased urine output after receiving IV Lasix in the ED.  [facility] ED Course  Labs/Imaging on admission: I have personally reviewed following labs and imaging studies.  Initial vitals showed BP 127/61, pulse 76, RR 14, temp 97.7 F, SPO2 100% on room air.  Labs show sodium 139, potassium 3.9, bicarb 20, BUN 79, creatinine 3.18, serum glucose 91, LFTs within normal limits, WBC 11.1, hemoglobin 9.3, platelets 202,000, BNP 338.1, troponin 20x2.  Portable chest x-ray shows low lung volumes without focal consolidation, edema, effusion.  Bilateral lower extremity venous ultrasound negative for evidence of DVT.  Patient was given IV Lasix 40 mg.  The hospitalist service was consulted to admit for further evaluation and management.  Review of Systems: All systems reviewed and are  negative except as documented in history of present illness above.   Past Medical History:  Diagnosis Date   Abdominal discomfort    Chronic N/V/D. Presumptive dx Crohn's dx per elevated p ANCA. Failed Entocort and Pentasa. Sep 2003 - ileocolectomy c anastomosis per Dr Deon Pilling 2/2 adhesions - path was hegative for Crohns. EGD, Sm bowel follow through (11/03), and an eteroclysis (10/03) were unrevealing. Cuases hypomag and hypocalcemia.   Adnexal mass 8/03   s/p lap BSO (R ovarian fibroma) & lysis of adhesions   Allergy    Seasonal   Anemia    Multifactorial. Baseline HgB 10-11 ish. B12 def - 150 in 3/10. Fe Def - ferritin 35 3/10. Both are being repleted.   Breast cancer ([place]) 03/16/13   right, 5 o'clock   CAD (coronary artery disease) 1996   1996 - PTCA and angioplasty diagonal branch. 2000 - Rotoblator & angiopllasty of diagonal. 2006 - subendocardial AMI, DES to proximal LAD.Marland Kitchen Also had 90% stenosis in distal apical LAD. EF 55 with apical hypokinesis. Indefinite ASA and Plavix.   CHF (congestive heart failure) (HCC)    Chronic kidney disease    Chronic renal insuff baseline Cr 1.2 - 1.4 ish.   Chronic kidney disease, stage III (moderate) (HCC)    Chronic renal insuff baseline Cr 1.2 - 1.4 ish. New baseline about 2 (2018)   Chronic pain    CT 10/10 = Spinal stenosis L2 - S1.   COVID-19 11/02/2020   Diabetes mellitus    Insulin dependent   Diabetes mellitus type 2 in obese ([place]) 05/03/2010   Managed on lantus and novolog.  Has diabetic nephropathy. Metformin D/C'd 2012 2/2 creatinine.  No diabetic retinopathy per 3/11.    Gout    Hx of radiation therapy 06/02/13- 07/16/13   right rbeast 4500 cGy 25 sessions, right breast boost 1600 cGy in 8 sessions   Hyperlipidemia    Managed with both a statin and Welchol. Welchol stopped 2014 2/2 cost and started on fenofibrate    Hypertension    2006 B renal arteries patent. 2003 MRA - no RAS. 2003 pheo W/U Dr Hassell Done reportedly negative.   Hypoxia  07/23/2017   Lupus ([place])    Lymphedema of breast    Personal history of radiation therapy 2015   Pneumonia of left lung due to infectious organism    RBBB    Renal vein thrombosis (HCC)    SBO (small bowel obstruction) ([place]) 09/17/2017   Secondary hyperparathyroidism ([place])    Stroke ([place])    Incidental finding MRI 2002 L lacunar infarct   Vitamin B12 deficiency    Vitamin D deficiency    Wears dentures    top    Past Surgical History:  Procedure Laterality Date   ABDOMINAL HYSTERECTOMY     BILATERAL SALPINGOOPHORECTOMY  8/03   Lap BSO (R ovarian fibroma) and adhesion lysis   BIOPSY  09/08/2020   Procedure: BIOPSY;  Surgeon: Ronald Lobo, MD;  Location: [facility];  Service: Endoscopy;;   BOWEL RESECTION  2003   ileocolectomy with anastomosis 2/2 adhesions   BREAST BIOPSY Right 2015   BREAST BIOPSY Right 07/2014   BREAST LUMPECTOMY Right 04/22/2013   BREAST LUMPECTOMY WITH NEEDLE LOCALIZATION AND AXILLARY SENTINEL LYMPH NODE BX Right 04/22/2013   Procedure: BREAST LUMPECTOMY WITH NEEDLE LOCALIZATION AND AXILLARY SENTINEL LYMPH NODE BX;  Surgeon: Stark Klein, MD;  Location: [facility];  Service: General;  Laterality: Right;   CARDIAC CATHETERIZATION     2 stents   CHOLECYSTECTOMY     COLONOSCOPY     ESOPHAGOGASTRODUODENOSCOPY N/A 09/08/2020   Procedure: ESOPHAGOGASTRODUODENOSCOPY (EGD);  Surgeon: Ronald Lobo, MD;  Location: [facility];  Service: Endoscopy;  Laterality: N/A;   HEMICOLECTOMY     R sided hemicolectomy   HERNIA REPAIR     Ventral hernia repair   PTCA  4/06    Social History:  reports that she has never smoked. She has never used smokeless tobacco. She reports that she does not drink alcohol and does not use drugs.  Allergies  Allergen Reactions   Percocet [Oxycodone-Acetaminophen] Other (See Comments)    headaches   Diazepam Other (See Comments)    REACTION: Agitation Other reaction(s): agitation   Haloperidol Lactate Nausea And  Vomiting   Lorazepam Nausea Only    Other reaction(s): nausea   Morphine Sulfate Other (See Comments)    REACTION: Agitation   Propoxyphene Hcl Nausea And Vomiting   Tramadol Hcl Nausea And Vomiting   Anesthetics, Halogenated     Per patient's daughter, she can not tolerate anesthetics.    Etomidate     Heart stopped during anesthesia   Fentanyl     Heart stopped during anesthesia    Haloperidol     Other reaction(s): nausea & vomitin   Morphine Sulfate     Other reaction(s): nausea   Oxycodone-Acetaminophen     Other reaction(s): headaches   Propoxyphene     Other reaction(s): nausea & vomiting   Tramadol Hcl     Other reaction(s): nausea & vomitin Other reaction(s): nausea & vomitin   Versed [Midazolam]  Heart stopped during anesthesia    Family History  Problem Relation Age of Onset   Pancreatic cancer Brother 21   Breast cancer Mother 65   Lung cancer Maternal Aunt    Breast cancer Maternal Aunt    Prostate cancer Maternal Uncle    Heart attack Maternal Grandmother    Breast cancer Maternal Grandmother    Colon cancer Maternal Grandmother    Clotting disorder Maternal Grandmother    Diabetes Maternal Grandmother    Kidney failure Maternal Aunt    Hypertension Daughter      Prior to Admission medications   Medication Sig Start Date End Date Taking? Authorizing Provider  acetaminophen (TYLENOL) 500 MG tablet Take 1,000 mg by mouth every 6 (six) hours as needed for moderate pain or headache.    [provider]  apixaban (ELIQUIS) 2.5 MG TABS tablet Take 1 tablet (2.5 mg total) by mouth 2 (two) times daily. 11/25/20   Ghimire, Henreitta Leber, MD  Artificial Tear Ointment (DRY EYES OP) Place 1 drop into the right eye 2 (two) times daily as needed (dry eye).    [provider]  budesonide (ENTOCORT EC) 3 MG 24 hr capsule Take 1 capsule by mouth once daily Patient taking differently: Take 3 mg by mouth daily. 11/30/20   Gatha Mayer, MD   Cyanocobalamin (B-12 COMPLIANCE INJECTION IJ) Inject 1 vial as directed every 30 (thirty) days.    [provider]  ergocalciferol (VITAMIN D2) 1.25 MG (50000 UT) capsule Take 1 capsule (50,000 Units total) by mouth once a week. 08/02/20   Truitt Merle, MD  famotidine (PEPCID) 10 MG tablet Take 1 tablet (10 mg total) by mouth at bedtime. 12/10/20   Lavina Hamman, MD  furosemide (LASIX) 20 MG tablet Take 1 tablet (20 mg total) by mouth daily as needed (weight gain of >3Lbs in 1 day or >5 Lbs in 2 days.). Patient taking differently: Take 20 mg by mouth daily as needed for fluid (weight gain of >3Lbs in 1 day or >5 Lbs in 2 days.). 12/10/20   Lavina Hamman, MD  hydrALAZINE (APRESOLINE) 100 MG tablet Take 0.5 tablets (50 mg total) by mouth 2 (two) times daily. 09/24/21   Josue Hector, MD  lipase/protease/amylase (CREON) 36000 UNITS CPEP capsule Take 36,000 Units by mouth 3 (three) times daily with meals.    [provider]  loperamide (IMODIUM A-D) 2 MG tablet Take 4 mg by mouth 4 (four) times daily as needed for diarrhea or loose stools.    [provider]  melatonin 5 MG TABS Take 1 tablet (5 mg total) by mouth at bedtime as needed (sleep). 11/19/20   Ghimire, Henreitta Leber, MD  mirtazapine (REMERON) 7.5 MG tablet Take 1 tablet (7.5 mg total) by mouth at bedtime. 12/22/20   Truitt Merle, MD  nitroGLYCERIN (NITROSTAT) 0.4 MG SL tablet DISSOLVE 1 TABLET UNDER THE TONGUE EVERY 5 MINUTES AS  NEEDED FOR CHEST PAIN. MAX  OF 3 TABLETS IN 15 MINUTES. CALL 911 IF PAIN PERSISTS. Patient taking differently: Place 0.4 mg under the tongue every 5 (five) minutes as needed for chest pain. 10/04/20   Jerline Pain, MD  Nutritional Supplements (FEEDING SUPPLEMENT, NEPRO CARB STEADY,) LIQD Take 237 mLs by mouth 2 (two) times daily between meals. 12/10/20   Lavina Hamman, MD  ondansetron (ZOFRAN-ODT) 4 MG disintegrating tablet DISSOLVE 1 TABLET IN MOUTH EVERY 8 HOURS AS NEEDED FOR NAUSEA OR VOMITING  09/28/21   Truitt Merle,  MD  pantoprazole (PROTONIX) 40 MG tablet TAKE 1 TABLET BY MOUTH  DAILY BEFORE BREAKFAST Patient taking differently: Take 40 mg by mouth daily. 10/24/20   Gatha Mayer, MD  sodium bicarbonate 650 MG tablet Take 2 tablets (1,300 mg total) by mouth 2 (two) times daily. Patient not taking: Reported on 04/16/2021 12/10/20   Lavina Hamman, MD    Physical Exam: Vitals:   10/19/21 2100 10/19/21 2130 10/19/21 2221 10/19/21 2311  BP: 124/65 127/67 132/64 135/65  Pulse: 78 75 75 79  Resp: 15 12 16 17   Temp:   98.3 F (36.8 C) 98 F (36.7 C)  TempSrc:   Oral Oral  SpO2: 99% 97% 100% 98%  Weight:    62.3 kg  Height:    5' 4"  (1.626 m)   Constitutional: Resting in bed, NAD, calm, comfortable Eyes: EOMI, lids and conjunctivae normal ENMT: Mucous membranes are moist. Posterior pharynx clear of any exudate or lesions.Normal dentition.  Neck: normal, supple, no masses. Respiratory: clear to auscultation bilaterally, no wheezing, no crackles. Normal respiratory effort. No accessory muscle use.  Cardiovascular: Regular rate and rhythm, no murmurs / rubs / gallops.  +2 bilateral lower extremity edema. 2+ pedal pulses. Abdomen: no tenderness, no masses palpated.  Musculoskeletal: no clubbing / cyanosis. No joint deformity upper and lower extremities. Good ROM, no contractures. Normal muscle tone.  Skin: Stasis dermatitis changes around the ankles Neurologic: Sensation intact. Strength 5/5 in all 4.  Psychiatric: Alert and oriented x 3. Normal mood.   EKG: Personally reviewed. Sinus rhythm, rate 75, RBBB, no acute ischemic changes.  Similar to prior.  Assessment/Plan Principal Problem:   Acute renal failure superimposed on stage 4 chronic kidney disease (HCC) Active Problems:   (HFpEF) heart failure with preserved ejection fraction (HCC)   Coronary artery disease involving native coronary artery of native heart   Hypertension associated with diabetes ([place])   Type 2 diabetes  mellitus with stage 4 chronic kidney disease, without long-term current use of insulin (HCC)   History of stroke without residual deficits   Anemia in chronic kidney disease   Hyperlipidemia associated with type 2 diabetes mellitus ([place])   Chronic gastritis   [name] is a 76 y.o. female with medical history significant for CKD stage IV, chronic diastolic CHF (EF 04-54% by TTE 06/2020), CAD (s/p PTCA 1996, DES to pLAD 2006), Hx of DVT on Eliquis, history of CVA, T2DM, HTN, HLD, anemia of CKD and iron deficiency, chronic gastritis who is admitted with AKI on CKD stage IV.  Assessment and Plan: * Acute renal failure superimposed on stage 4 chronic kidney disease (HCC) Creatinine 3.18 with GFR 15 on admission.  Fluctuating renal function however appears at baseline CKD stage IV.  Appears volume overloaded on admission.  Prescribed Lasix prn, not sure if she is taking consistently. -S/p IV Lasix 40 mg -Monitor response and diurese further as needed -Repeat labs in a.m. -May be approaching CKD stage V  (HFpEF) heart failure with preserved ejection fraction ([place]) TTE 06/2020 showed EF 55-60% with G1DD.  Appears volume overloaded on admission as above. -Monitor response to Lasix and diurese further as needed -Strict I/O's and daily weights  Coronary artery disease involving native coronary artery of native heart Denies any chest pain.  Troponin flat at 20x2.  EKG without acute ischemic changes.  Not on antiplatelets as she is on Eliquis.  Not on statin due to concern for statin associated transaminitis earlier this year.  Hypertension associated  with diabetes ([place]) Continue hydralazine.  Type 2 diabetes mellitus with stage 4 chronic kidney disease, without long-term current use of insulin (HCC) Not currently on medical management.  Last A1c 6.8% 04/17/2021.  Placed on very sensitive SSI and check A1c.  History of stroke without residual deficits Continue Eliquis.  Chronic  gastritis Continue home budesonide, Creon, Protonix.  Hyperlipidemia associated with type 2 diabetes mellitus ([place]) No longer on statin due to concern for statin associated transaminitis.  Anemia in chronic kidney disease Hemoglobin slightly decreased from recent baseline at 9.3.  No obvious bleeding.  Continue to monitor.  DVT prophylaxis: apixaban (ELIQUIS) tablet 2.5 mg Start: 10/20/21 1000 apixaban (ELIQUIS) tablet 2.5 mg   Code Status: Full code, confirmed with patient on admission Family Communication: Discussed with patient, she has discussed with family. Disposition Plan: From home, dispo pending clinical progress Consults called: None Severity of Illness: The appropriate patient status for this patient is OBSERVATION. Observation status is judged to be reasonable and necessary in order to provide the required intensity of service to ensure the patient's safety. The patient's presenting symptoms, physical exam findings, and initial radiographic and laboratory data in the context of their medical condition is felt to place them at decreased risk for further clinical deterioration. Furthermore, it is anticipated that the patient will be medically stable for discharge from the hospital within 2 midnights of admission.   Zada Finders MD [facility]  If 7PM-7AM, please contact night-coverage [email or web address]  10/20/2021, 12:38 AM

## 2021-10-19 NOTE — ED Triage Notes (Signed)
Pt has had 2 weeks of swelling of both legs, red and itchy. Hadnt got to see md yet. Today feeling weak and "off".

## 2021-10-19 NOTE — ED Provider Notes (Signed)
[facility] Provider Note   CSN: [id number] Arrival date & time: 10/19/21  1715     History  Chief Complaint  Patient presents with   Weakness    [name] is a 76 y.o. female.  Patient here for weakness, leg swelling.  Maybe some shortness of breath.  Has not taken Eliquis for the last 2 weeks.  History of DVT.  Legs have been swelling here recently.  History of heart failure, hypertension, chronic kidney disease.  Nothing makes it worse or better.  Denies any chest pain, active shortness of breath, fever, chills, cough.  The history is provided by the patient.       Home Medications Prior to Admission medications   Medication Sig Start Date End Date Taking? Authorizing Provider  acetaminophen (TYLENOL) 500 MG tablet Take 1,000 mg by mouth every 6 (six) hours as needed for moderate pain or headache.    [provider]  apixaban (ELIQUIS) 2.5 MG TABS tablet Take 1 tablet (2.5 mg total) by mouth 2 (two) times daily. 11/25/20   Ghimire, Henreitta Leber, MD  Artificial Tear Ointment (DRY EYES OP) Place 1 drop into the right eye 2 (two) times daily as needed (dry eye).    [provider]  budesonide (ENTOCORT EC) 3 MG 24 hr capsule Take 1 capsule by mouth once daily Patient taking differently: Take 3 mg by mouth daily. 11/30/20   Gatha Mayer, MD  Cyanocobalamin (B-12 COMPLIANCE INJECTION IJ) Inject 1 vial as directed every 30 (thirty) days.    [provider]  ergocalciferol (VITAMIN D2) 1.25 MG (50000 UT) capsule Take 1 capsule (50,000 Units total) by mouth once a week. 08/02/20   Truitt Merle, MD  famotidine (PEPCID) 10 MG tablet Take 1 tablet (10 mg total) by mouth at bedtime. 12/10/20   Lavina Hamman, MD  furosemide (LASIX) 20 MG tablet Take 1 tablet (20 mg total) by mouth daily as needed (weight gain of >3Lbs in 1 day or >5 Lbs in 2 days.). Patient taking differently: Take 20 mg by mouth daily as needed for fluid (weight  gain of >3Lbs in 1 day or >5 Lbs in 2 days.). 12/10/20   Lavina Hamman, MD  hydrALAZINE (APRESOLINE) 100 MG tablet Take 0.5 tablets (50 mg total) by mouth 2 (two) times daily. 09/24/21   Josue Hector, MD  lipase/protease/amylase (CREON) 36000 UNITS CPEP capsule Take 36,000 Units by mouth 3 (three) times daily with meals.    [provider]  loperamide (IMODIUM A-D) 2 MG tablet Take 4 mg by mouth 4 (four) times daily as needed for diarrhea or loose stools.    [provider]  melatonin 5 MG TABS Take 1 tablet (5 mg total) by mouth at bedtime as needed (sleep). 11/19/20   Ghimire, Henreitta Leber, MD  mirtazapine (REMERON) 7.5 MG tablet Take 1 tablet (7.5 mg total) by mouth at bedtime. 12/22/20   Truitt Merle, MD  nitroGLYCERIN (NITROSTAT) 0.4 MG SL tablet DISSOLVE 1 TABLET UNDER THE TONGUE EVERY 5 MINUTES AS  NEEDED FOR CHEST PAIN. MAX  OF 3 TABLETS IN 15 MINUTES. CALL 911 IF PAIN PERSISTS. Patient taking differently: Place 0.4 mg under the tongue every 5 (five) minutes as needed for chest pain. 10/04/20   Jerline Pain, MD  Nutritional Supplements (FEEDING SUPPLEMENT, NEPRO CARB STEADY,) LIQD Take 237 mLs by mouth 2 (two) times daily between meals. 12/10/20   Lavina Hamman, MD  ondansetron (ZOFRAN-ODT) 4  MG disintegrating tablet DISSOLVE 1 TABLET IN MOUTH EVERY 8 HOURS AS NEEDED FOR NAUSEA OR VOMITING 09/28/21   Truitt Merle, MD  pantoprazole (PROTONIX) 40 MG tablet TAKE 1 TABLET BY MOUTH  DAILY BEFORE BREAKFAST Patient taking differently: Take 40 mg by mouth daily. 10/24/20   Gatha Mayer, MD  sodium bicarbonate 650 MG tablet Take 2 tablets (1,300 mg total) by mouth 2 (two) times daily. Patient not taking: Reported on 04/16/2021 12/10/20   Lavina Hamman, MD      Allergies    Percocet [oxycodone-acetaminophen]; Diazepam; Haloperidol lactate; Lorazepam; Morphine sulfate; Propoxyphene hcl; Tramadol hcl; Anesthetics, halogenated; Etomidate; Fentanyl; Haloperidol; Morphine sulfate;  Oxycodone-acetaminophen; Propoxyphene; Tramadol hcl; and Versed [midazolam]    Review of Systems   Review of Systems  Physical Exam Updated Vital Signs BP 126/62 (BP Location: Left Arm)   Pulse 77   Temp 98 F (36.7 C) (Oral)   Resp 16   SpO2 100%  Physical Exam Vitals and nursing note reviewed.  Constitutional:      General: She is not in acute distress.    Appearance: She is well-developed. She is not ill-appearing.  HENT:     Head: Normocephalic and atraumatic.     Nose: Nose normal.     Mouth/Throat:     Mouth: Mucous membranes are moist.  Eyes:     Extraocular Movements: Extraocular movements intact.     Conjunctiva/sclera: Conjunctivae normal.     Pupils: Pupils are equal, round, and reactive to light.  Cardiovascular:     Rate and Rhythm: Normal rate and regular rhythm.     Pulses: Normal pulses.     Heart sounds: No murmur heard. Pulmonary:     Effort: Pulmonary effort is normal. No respiratory distress.     Breath sounds: Normal breath sounds.  Abdominal:     Palpations: Abdomen is soft.     Tenderness: There is no abdominal tenderness.  Musculoskeletal:        General: No swelling.     Cervical back: Normal range of motion and neck supple.     Right lower leg: Edema (3+) present.     Left lower leg: Edema (2+) present.  Skin:    General: Skin is warm and dry.     Capillary Refill: Capillary refill takes less than 2 seconds.  Neurological:     General: No focal deficit present.     Mental Status: She is alert and oriented to person, place, and time.     Cranial Nerves: No cranial nerve deficit.     Sensory: No sensory deficit.     Motor: No weakness.     Coordination: Coordination normal.     Comments: 5+ out of 5 strength throughout, normal sensation, no drift, normal finger-to-nose.  Psychiatric:        Mood and Affect: Mood normal.     ED Results / Procedures / Treatments   Labs (all labs ordered are listed, but only abnormal results are  displayed) Labs Reviewed  CBC WITH DIFFERENTIAL/PLATELET - Abnormal; Notable for the following components:      Result Value   WBC 11.1 (*)    RBC 3.51 (*)    Hemoglobin 9.3 (*)    HCT 29.7 (*)    RDW 20.3 (*)    Neutro Abs 8.9 (*)    Abs Immature Granulocytes 0.12 (*)    All other components within normal limits  COMPREHENSIVE METABOLIC PANEL - Abnormal; Notable for the following components:  CO2 20 (*)    BUN 79 (*)    Creatinine, Ser 3.18 (*)    Calcium 8.3 (*)    GFR, Estimated 15 (*)    All other components within normal limits  BRAIN NATRIURETIC PEPTIDE - Abnormal; Notable for the following components:   B Natriuretic Peptide 338.1 (*)    All other components within normal limits  TROPONIN I (HIGH SENSITIVITY) - Abnormal; Notable for the following components:   Troponin I (High Sensitivity) 20 (*)    All other components within normal limits  TROPONIN I (HIGH SENSITIVITY)    EKG EKG Interpretation  Date/Time:  Thursday October 19 2021 18:12:43 EDT Ventricular Rate:  75 PR Interval:  142 QRS Duration: 144 QT Interval:  450 QTC Calculation: 503 R Axis:   -23 Text Interpretation: Sinus rhythm Right bundle branch block Left ventricular hypertrophy Confirmed by Lennice Sites ([id number]) on 10/19/2021 6:16:03 PM  Radiology US Venous Img Lower Bilateral (DVT)  Result Date: 10/19/2021 CLINICAL DATA:  Pain and swelling, prior history of DVT EXAM: BILATERAL LOWER EXTREMITY VENOUS DOPPLER ULTRASOUND TECHNIQUE: Gray-scale sonography with graded compression, as well as color Doppler and duplex ultrasound were performed to evaluate the lower extremity deep venous systems from the level of the common femoral vein and including the common femoral, femoral, profunda femoral, popliteal and calf veins including the posterior tibial, peroneal and gastrocnemius veins when visible. The superficial great saphenous vein was also interrogated. Spectral Doppler was utilized to evaluate flow at rest  and with distal augmentation maneuvers in the common femoral, femoral and popliteal veins. COMPARISON:  None Available. FINDINGS: RIGHT LOWER EXTREMITY Common Femoral Vein: No evidence of thrombus. Normal compressibility, respiratory phasicity and response to augmentation. Saphenofemoral Junction: No evidence of thrombus. Normal compressibility and flow on color Doppler imaging. Profunda Femoral Vein: No evidence of thrombus. Normal compressibility and flow on color Doppler imaging. Femoral Vein: No evidence of thrombus. Normal compressibility, respiratory phasicity and response to augmentation. Popliteal Vein: No evidence of thrombus. Normal compressibility, respiratory phasicity and response to augmentation. Calf Veins: No evidence of thrombus. Normal compressibility and flow on color Doppler imaging. Superficial Great Saphenous Vein: No evidence of thrombus. Normal compressibility. Venous Reflux:  None. Other Findings:  There is edema in subcutaneous plane. LEFT LOWER EXTREMITY Common Femoral Vein: No evidence of thrombus. Normal compressibility, respiratory phasicity and response to augmentation. Saphenofemoral Junction: No evidence of thrombus. Normal compressibility and flow on color Doppler imaging. Profunda Femoral Vein: No evidence of thrombus. Normal compressibility and flow on color Doppler imaging. Femoral Vein: No evidence of thrombus. Normal compressibility, respiratory phasicity and response to augmentation. Popliteal Vein: No evidence of thrombus. Normal compressibility, respiratory phasicity and response to augmentation. Calf Veins: No evidence of thrombus. Normal compressibility and flow on color Doppler imaging. Superficial Great Saphenous Vein: No evidence of thrombus. Normal compressibility. Venous Reflux:  None. Other Findings:  There is edema in subcutaneous plane. IMPRESSION: No evidence of deep venous thrombosis in either lower extremity. Electronically Signed   By: Elmer Picker M.D.    On: 10/19/2021 19:20   DG Chest Portable 1 View  Result Date: 10/19/2021 CLINICAL DATA:  Two weeks of swelling of both legs. EXAM: PORTABLE CHEST 1 VIEW COMPARISON:  April 16, 2021 FINDINGS: The heart size and mediastinal contours are within normal limits. Low lung volumes are noted. Both lungs are clear. Radiopaque surgical clips are seen along the right axilla and within the right upper quadrant. Multilevel degenerative changes are seen throughout the thoracic  spine. IMPRESSION: Low lung volumes without evidence of acute or active cardiopulmonary disease. Electronically Signed   By: Virgina Norfolk M.D.   On: 10/19/2021 18:56    Procedures Procedures    Medications Ordered in ED Medications  furosemide (LASIX) injection 40 mg (40 mg Intravenous Given 10/19/21 2006)    ED Course/ Medical Decision Making/ A&P                           Medical Decision Making Amount and/or Complexity of Data Reviewed Labs: ordered. Radiology: ordered.  Risk Prescription drug management. Decision regarding hospitalization.   Pranavi [name] Harnack is here with weakness and leg swelling.  History of CKD, heart failure, CAD, hypertension, DVT, stroke.  She has not been compliant with her Eliquis.  Worsening leg swelling and weakness over the last few days.  Right leg appears more swollen than the left.  Differential diagnosis is possibly DVT versus heart failure exacerbation versus worsening of CKD/uremia, infectious process.  We will get CBC, CMP, BNP, troponin, chest x-ray, ultrasound of her lower legs.  EKG per my review and interpretation shows sinus rhythm.  No ischemic changes.  Chest x-ray does not show any signs of obvious volume overload per my review and interpretation.  Troponin is 20, BNP is 340.  Creatinine slightly above baseline at 3.18.  BUN is 79.  Otherwise no significant leukocytosis or anemia.  Overall suspect a combination of issues causing volume overload.  Suspect worsening of renal  function and may be some heart failure.  Will admit for further care.  DVT study is pending.  DVT study negative.  Talked with hospitalist and will give her a dose IV Lasix to help with volume overload.  Overall suspect her symptoms are due to worsening renal failure and uremia.  She will be admitted for further care.  This chart was dictated using voice recognition software.  Despite best efforts to proofread,  errors can occur which can change the documentation meaning.         Final Clinical Impression(s) / ED Diagnoses Final diagnoses:  Weakness  Hypervolemia, unspecified hypervolemia type    Rx / DC Orders ED Discharge Orders     None         Lennice Sites, DO 10/19/21 2009

## 2021-10-19 NOTE — ED Notes (Signed)
Pt is prescribed eliquis, but has been out for 2 weeks.

## 2021-10-20 DIAGNOSIS — Z86718 Personal history of other venous thrombosis and embolism: Secondary | ICD-10-CM | POA: Diagnosis not present

## 2021-10-20 DIAGNOSIS — Z8673 Personal history of transient ischemic attack (TIA), and cerebral infarction without residual deficits: Secondary | ICD-10-CM | POA: Diagnosis not present

## 2021-10-20 DIAGNOSIS — Z955 Presence of coronary angioplasty implant and graft: Secondary | ICD-10-CM | POA: Diagnosis not present

## 2021-10-20 DIAGNOSIS — I872 Venous insufficiency (chronic) (peripheral): Secondary | ICD-10-CM | POA: Diagnosis not present

## 2021-10-20 DIAGNOSIS — E1122 Type 2 diabetes mellitus with diabetic chronic kidney disease: Secondary | ICD-10-CM | POA: Diagnosis not present

## 2021-10-20 DIAGNOSIS — E1169 Type 2 diabetes mellitus with other specified complication: Secondary | ICD-10-CM | POA: Diagnosis not present

## 2021-10-20 DIAGNOSIS — D631 Anemia in chronic kidney disease: Secondary | ICD-10-CM | POA: Diagnosis not present

## 2021-10-20 DIAGNOSIS — I130 Hypertensive heart and chronic kidney disease with heart failure and stage 1 through stage 4 chronic kidney disease, or unspecified chronic kidney disease: Secondary | ICD-10-CM | POA: Diagnosis not present

## 2021-10-20 DIAGNOSIS — I2510 Atherosclerotic heart disease of native coronary artery without angina pectoris: Secondary | ICD-10-CM | POA: Diagnosis not present

## 2021-10-20 DIAGNOSIS — I5032 Chronic diastolic (congestive) heart failure: Secondary | ICD-10-CM | POA: Diagnosis not present

## 2021-10-20 DIAGNOSIS — E785 Hyperlipidemia, unspecified: Secondary | ICD-10-CM | POA: Diagnosis not present

## 2021-10-20 DIAGNOSIS — N179 Acute kidney failure, unspecified: Secondary | ICD-10-CM | POA: Diagnosis not present

## 2021-10-20 DIAGNOSIS — N184 Chronic kidney disease, stage 4 (severe): Secondary | ICD-10-CM | POA: Diagnosis not present

## 2021-10-20 LAB — CBC
HCT: 26.8 % — ABNORMAL LOW (ref 36.0–46.0)
Hemoglobin: 8.7 g/dL — ABNORMAL LOW (ref 12.0–15.0)
MCH: 27.3 pg (ref 26.0–34.0)
MCHC: 32.5 g/dL (ref 30.0–36.0)
MCV: 84 fL (ref 80.0–100.0)
Platelets: 168 10*3/uL (ref 150–400)
RBC: 3.19 MIL/uL — ABNORMAL LOW (ref 3.87–5.11)
RDW: 20.2 % — ABNORMAL HIGH (ref 11.5–15.5)
WBC: 7.5 10*3/uL (ref 4.0–10.5)
nRBC: 0 % (ref 0.0–0.2)

## 2021-10-20 LAB — MAGNESIUM
Magnesium: 1 mg/dL — ABNORMAL LOW (ref 1.7–2.4)
Magnesium: 1.2 mg/dL — ABNORMAL LOW (ref 1.7–2.4)

## 2021-10-20 LAB — BASIC METABOLIC PANEL
Anion gap: 11 (ref 5–15)
BUN: 83 mg/dL — ABNORMAL HIGH (ref 8–23)
CO2: 21 mmol/L — ABNORMAL LOW (ref 22–32)
Calcium: 7.8 mg/dL — ABNORMAL LOW (ref 8.9–10.3)
Chloride: 108 mmol/L (ref 98–111)
Creatinine, Ser: 2.95 mg/dL — ABNORMAL HIGH (ref 0.44–1.00)
GFR, Estimated: 16 mL/min — ABNORMAL LOW (ref >60)
Glucose, Bld: 73 mg/dL (ref 70–99)
Potassium: 3.3 mmol/L — ABNORMAL LOW (ref 3.5–5.1)
Sodium: 140 mmol/L (ref 135–145)

## 2021-10-20 LAB — GLUCOSE, CAPILLARY
Glucose-Capillary: 65 mg/dL — ABNORMAL LOW (ref 70–99)
Glucose-Capillary: 140 mg/dL — ABNORMAL HIGH (ref 70–99)
Glucose-Capillary: 123 mg/dL — ABNORMAL HIGH (ref 70–99)

## 2021-10-20 LAB — HEMOGLOBIN A1C
Hgb A1c MFr Bld: 5.5 % (ref 4.8–5.6)
Mean Plasma Glucose: 111.15 mg/dL

## 2021-10-20 MED ORDER — APIXABAN 2.5 MG PO TABS
2.5000 mg | ORAL_TABLET | Freq: Two times a day (BID) | ORAL | Status: DC
  Administered 2021-10-20: 2.5 mg via ORAL
  Filled 2021-10-20: qty 1

## 2021-10-20 MED ORDER — BUDESONIDE 3 MG PO CPEP: 3.0000 mg | ORAL_CAPSULE | Freq: Every day | ORAL | Status: DC

## 2021-10-20 MED ORDER — ONDANSETRON HCL 4 MG PO TABS: 4.0000 mg | ORAL_TABLET | Freq: Four times a day (QID) | ORAL | Status: DC | PRN

## 2021-10-20 MED ORDER — HYDRALAZINE HCL 50 MG PO TABS
50.0000 mg | ORAL_TABLET | Freq: Two times a day (BID) | ORAL | Status: DC
  Filled 2021-10-20: qty 1

## 2021-10-20 MED ORDER — ACETAMINOPHEN 650 MG RE SUPP: 650.0000 mg | Freq: Four times a day (QID) | RECTAL | Status: DC | PRN

## 2021-10-20 MED ORDER — PANTOPRAZOLE SODIUM 40 MG PO TBEC
40.0000 mg | DELAYED_RELEASE_TABLET | Freq: Every day | ORAL | Status: DC
  Administered 2021-10-20: 40 mg via ORAL
  Filled 2021-10-20: qty 1

## 2021-10-20 MED ORDER — MELATONIN 5 MG PO TABS: 5.0000 mg | ORAL_TABLET | Freq: Every evening | ORAL | Status: DC | PRN

## 2021-10-20 MED ORDER — MIRTAZAPINE 15 MG PO TABS: 7.5000 mg | ORAL_TABLET | Freq: Every day | ORAL | Status: DC

## 2021-10-20 MED ORDER — ONDANSETRON HCL 4 MG/2ML IJ SOLN: 4.0000 mg | Freq: Four times a day (QID) | INTRAMUSCULAR | Status: DC | PRN

## 2021-10-20 MED ORDER — POTASSIUM CHLORIDE CRYS ER 10 MEQ PO TBCR
40.0000 meq | EXTENDED_RELEASE_TABLET | ORAL | Status: AC
  Administered 2021-10-20 (×2): 40 meq via ORAL
  Filled 2021-10-20 (×2): qty 4

## 2021-10-20 MED ORDER — ACETAMINOPHEN 325 MG PO TABS
650.0000 mg | ORAL_TABLET | Freq: Four times a day (QID) | ORAL | Status: DC | PRN
  Administered 2021-10-20: 650 mg via ORAL
  Filled 2021-10-20: qty 2

## 2021-10-20 MED ORDER — SENNOSIDES-DOCUSATE SODIUM 8.6-50 MG PO TABS
1.0000 | ORAL_TABLET | Freq: Every evening | ORAL | Status: DC | PRN
Start: 2021-10-20 — End: 2021-10-21

## 2021-10-20 MED ORDER — LOPERAMIDE HCL 2 MG PO CAPS
4.0000 mg | ORAL_CAPSULE | Freq: Four times a day (QID) | ORAL | Status: DC | PRN
Start: 2021-10-20 — End: 2021-10-21

## 2021-10-20 MED ORDER — T.E.D. BELOW KNEE/MEDIUM MISC: 1.0000 | Freq: Every day | 0 refills | Status: DC

## 2021-10-20 MED ORDER — MAGNESIUM SULFATE 4 GM/100ML IV SOLN
4.0000 g | Freq: Once | INTRAVENOUS | Status: AC
  Administered 2021-10-20: 4 g via INTRAVENOUS
  Filled 2021-10-20: qty 100

## 2021-10-20 MED ORDER — SODIUM CHLORIDE 0.9% FLUSH
3.0000 mL | Freq: Two times a day (BID) | INTRAVENOUS | Status: DC
  Administered 2021-10-20 (×2): 3 mL via INTRAVENOUS

## 2021-10-20 MED ORDER — INSULIN ASPART 100 UNIT/ML IJ SOLN: 0.0000 [IU] | Freq: Three times a day (TID) | INTRAMUSCULAR | Status: DC

## 2021-10-20 MED ORDER — PANCRELIPASE (LIP-PROT-AMYL) 12000-38000 UNITS PO CPEP
36000.0000 [IU] | ORAL_CAPSULE | Freq: Three times a day (TID) | ORAL | Status: DC
  Administered 2021-10-20 (×2): 36000 [IU] via ORAL
  Filled 2021-10-20 (×2): qty 3

## 2021-10-20 NOTE — Assessment & Plan Note (Signed)
Not currently on medical management.  Last A1c 6.8% 04/17/2021.  Placed on very sensitive SSI and check A1c.

## 2021-10-20 NOTE — Progress Notes (Signed)
8/4 Pt is concerned about bilateral LE rash (ankle area). States it has been there a week and it itches. Asked pt to make MD aware of concerns. Pt resting with call bell within reach.  Will continue to monitor. Payton Emerald, RN

## 2021-10-20 NOTE — Care Management Obs Status (Signed)
[facility] NOTIFICATION   Patient Details  Name: Tannya [name] Takemoto MRN: [id number] Date of Birth: Jul 22, 1945   Medicare Observation Status Notification Given:  Yes    Dawayne Patricia, RN 10/20/2021, 2:43 PM

## 2021-10-20 NOTE — Hospital Course (Signed)
[name] is a 76 y.o. female with medical history significant for CKD stage IV, chronic diastolic CHF (EF 92-76% by TTE 06/2020), CAD (s/p PTCA 1996, DES to pLAD 2006), Hx of DVT on Eliquis, history of CVA, T2DM, HTN, HLD, anemia of CKD and iron deficiency, chronic gastritis who is admitted with AKI on CKD stage IV.

## 2021-10-20 NOTE — Assessment & Plan Note (Signed)
Creatinine 3.18 with GFR 15 on admission.  Fluctuating renal function however appears at baseline CKD stage IV.  Appears volume overloaded on admission.  Prescribed Lasix prn, not sure if she is taking consistently. -S/p IV Lasix 40 mg -Monitor response and diurese further as needed -Repeat labs in a.m. -May be approaching CKD stage V

## 2021-10-20 NOTE — Assessment & Plan Note (Signed)
-   Continue Eliquis 

## 2021-10-20 NOTE — Discharge Summary (Addendum)
Physician Discharge Summary  [name] ZOX:[id number] DOB: 1945/12/25 DOA: 10/19/2021  PCP: Seward Carol, MD  Admit date: 10/19/2021 Discharge date: 10/20/2021 Discharging to: Home Recommendations for Outpatient Follow-up:  Recommend referral to nephrology F/u venous insufficiency   Consults:  none   Discharge Diagnoses:   Principal Problem:   Acute renal failure superimposed on stage 4 chronic kidney disease ([place]) Active Problems:   Chronic venous insufficiency of lower extremity Chronic (HFpEF) heart failure with preserved ejection fraction (HCC)   Coronary artery disease involving native coronary artery of native heart   Hypertension associated with diabetes ([place])   Type 2 diabetes mellitus with stage 4 chronic kidney disease, without long-term current use of insulin (HCC)   History of stroke without residual deficits   Anemia in chronic kidney disease   Hyperlipidemia associated with type 2 diabetes mellitus (HCC)   Chronic gastritis     Hospital Course:  76 year old female with CKD stage IV, chronic diastolic heart failure, CAD status post stenting, DVT, CVA, type 2 diabetes mellitus, hypertension, anemia of chronic disease and iron deficiency, chronic gastritis who presented to the hospital for progressive lower extremity edema. In the ED: Noted to have a creatinine of 3.19 with her baseline being about 2.6-2.8.  A venous duplex was negative for DVT She was given IV Lasix and admitted to the hospital.  Principal Problem:   Acute renal failure superimposed on stage 4 chronic kidney disease  -Chest x-ray showed clear lungs and there was no hypoxia -Not sure if she was fluid overloaded however, creatinine has improved to 2.95 -She is taking as needed Lasix at home which she can continue - I have discussed with her that she should establish care with a nephrologist as she does not already have one  Active Problems:   Chronic venous insufficiency of lower  extremity -I have ordered TED hose and giving her prescription for TED hose as well - I have explained in a couple of different ways about elevating her legs at rest when she is sitting or in bed    (HFpEF) heart failure with preserved ejection fraction (HCC)   Coronary artery disease involving native coronary artery of native heart   Hypertension associated with diabetes ([place])   Type 2 diabetes mellitus with stage 4 chronic kidney disease, without long-term current use of insulin ([place])   History of stroke without residual deficits   Anemia in chronic kidney disease   Hyperlipidemia associated with type 2 diabetes mellitus ([place])   Chronic gastritis  Of note, the patient is on budesonide and does not know what she takes this for          Discharge Instructions  Discharge Instructions     Diet - low sodium heart healthy   Complete by: As directed    Increase activity slowly   Complete by: As directed       Allergies as of 10/20/2021       Reactions   Percocet [oxycodone-acetaminophen] Other (See Comments)   headaches   Diazepam Other (See Comments)   Agitation   Haloperidol Lactate Nausea And Vomiting   Lorazepam Nausea Only   Morphine Sulfate Other (See Comments)   Agitation   Propoxyphene Hcl Nausea And Vomiting   Tramadol Hcl Nausea And Vomiting   Anesthetics, Halogenated Other (See Comments)   Per patient's daughter, she can not tolerate anesthetics.    Etomidate Other (See Comments)   Heart stopped during anesthesia   Fentanyl Other (See Comments)  Heart stopped during anesthesia   Haloperidol Nausea And Vomiting   Morphine Sulfate Nausea Only   Oxycodone-acetaminophen Other (See Comments)   headaches   Propoxyphene Nausea And Vomiting   Tramadol Hcl Nausea And Vomiting   Versed [midazolam] Other (See Comments)   Heart stopped during anesthesia        Medication List     TAKE these medications    acetaminophen 500 MG tablet Commonly known as:  TYLENOL Take 1,000 mg by mouth every 6 (six) hours as needed for moderate pain or headache.   apixaban 2.5 MG Tabs tablet Commonly known as: Eliquis Take 1 tablet (2.5 mg total) by mouth 2 (two) times daily.   B-12 COMPLIANCE INJECTION IJ Inject 1 vial as directed every 30 (thirty) days.   budesonide 3 MG 24 hr capsule Commonly known as: ENTOCORT EC Take 1 capsule by mouth once daily   DRY EYES OP Place 1 drop into the right eye 2 (two) times daily as needed (dry eye).   ergocalciferol 1.25 MG (50000 UT) capsule Commonly known as: VITAMIN D2 Take 1 capsule (50,000 Units total) by mouth once a week.   famotidine 20 MG tablet Commonly known as: PEPCID Take 20 mg by mouth at bedtime.   FLUoxetine 20 MG capsule Commonly known as: PROZAC Take 20 mg by mouth daily.   furosemide 20 MG tablet Commonly known as: LASIX Take 1 tablet (20 mg total) by mouth daily as needed (weight gain of >3Lbs in 1 day or >5 Lbs in 2 days.). What changed: reasons to take this   hydrALAZINE 100 MG tablet Commonly known as: APRESOLINE Take 0.5 tablets (50 mg total) by mouth 2 (two) times daily.   lipase/protease/amylase 36000 UNITS Cpep capsule Commonly known as: CREON Take 36,000 Units by mouth 3 (three) times daily with meals.   loperamide 2 MG tablet Commonly known as: IMODIUM A-D Take 4 mg by mouth 4 (four) times daily as needed for diarrhea or loose stools.   mirtazapine 7.5 MG tablet Commonly known as: REMERON Take 1 tablet (7.5 mg total) by mouth at bedtime.   nitroGLYCERIN 0.4 MG SL tablet Commonly known as: NITROSTAT DISSOLVE 1 TABLET UNDER THE TONGUE EVERY 5 MINUTES AS  NEEDED FOR CHEST PAIN. MAX  OF 3 TABLETS IN 15 MINUTES. CALL 911 IF PAIN PERSISTS. What changed: See the new instructions.   ondansetron 4 MG disintegrating tablet Commonly known as: ZOFRAN-ODT DISSOLVE 1 TABLET IN MOUTH EVERY 8 HOURS AS NEEDED FOR NAUSEA OR VOMITING What changed: See the new instructions.    pantoprazole 40 MG tablet Commonly known as: PROTONIX TAKE 1 TABLET BY MOUTH  DAILY BEFORE BREAKFAST What changed: when to take this   T.E.D. Below Knee/Medium Misc 1 each by Does not apply route daily. Measure legs and give appropriate size.        Follow-up Information     Seward Carol, MD Follow up.   Specialty: Internal Medicine Why: Please see Dr. Delfina Redwood in 1 week to have the following blood work done: Basic metabolic panel Contact information: [address] [place] [address] [phone]                    The results of significant diagnostics from this hospitalization (including imaging, microbiology, ancillary and laboratory) are listed below for reference.    US Venous Img Lower Bilateral (DVT)  Result Date: 10/19/2021 CLINICAL DATA:  Pain and swelling, prior history of DVT EXAM: BILATERAL LOWER  EXTREMITY VENOUS DOPPLER ULTRASOUND TECHNIQUE: Gray-scale sonography with graded compression, as well as color Doppler and duplex ultrasound were performed to evaluate the lower extremity deep venous systems from the level of the common femoral vein and including the common femoral, femoral, profunda femoral, popliteal and calf veins including the posterior tibial, peroneal and gastrocnemius veins when visible. The superficial great saphenous vein was also interrogated. Spectral Doppler was utilized to evaluate flow at rest and with distal augmentation maneuvers in the common femoral, femoral and popliteal veins. COMPARISON:  None Available. FINDINGS: RIGHT LOWER EXTREMITY Common Femoral Vein: No evidence of thrombus. Normal compressibility, respiratory phasicity and response to augmentation. Saphenofemoral Junction: No evidence of thrombus. Normal compressibility and flow on color Doppler imaging. Profunda Femoral Vein: No evidence of thrombus. Normal compressibility and flow on color Doppler imaging. Femoral Vein: No evidence of thrombus. Normal  compressibility, respiratory phasicity and response to augmentation. Popliteal Vein: No evidence of thrombus. Normal compressibility, respiratory phasicity and response to augmentation. Calf Veins: No evidence of thrombus. Normal compressibility and flow on color Doppler imaging. Superficial Great Saphenous Vein: No evidence of thrombus. Normal compressibility. Venous Reflux:  None. Other Findings:  There is edema in subcutaneous plane. LEFT LOWER EXTREMITY Common Femoral Vein: No evidence of thrombus. Normal compressibility, respiratory phasicity and response to augmentation. Saphenofemoral Junction: No evidence of thrombus. Normal compressibility and flow on color Doppler imaging. Profunda Femoral Vein: No evidence of thrombus. Normal compressibility and flow on color Doppler imaging. Femoral Vein: No evidence of thrombus. Normal compressibility, respiratory phasicity and response to augmentation. Popliteal Vein: No evidence of thrombus. Normal compressibility, respiratory phasicity and response to augmentation. Calf Veins: No evidence of thrombus. Normal compressibility and flow on color Doppler imaging. Superficial Great Saphenous Vein: No evidence of thrombus. Normal compressibility. Venous Reflux:  None. Other Findings:  There is edema in subcutaneous plane. IMPRESSION: No evidence of deep venous thrombosis in either lower extremity. Electronically Signed   By: Elmer Picker M.D.   On: 10/19/2021 19:20   DG Chest Portable 1 View  Result Date: 10/19/2021 CLINICAL DATA:  Two weeks of swelling of both legs. EXAM: PORTABLE CHEST 1 VIEW COMPARISON:  April 16, 2021 FINDINGS: The heart size and mediastinal contours are within normal limits. Low lung volumes are noted. Both lungs are clear. Radiopaque surgical clips are seen along the right axilla and within the right upper quadrant. Multilevel degenerative changes are seen throughout the thoracic spine. IMPRESSION: Low lung volumes without evidence of  acute or active cardiopulmonary disease. Electronically Signed   By: Virgina Norfolk M.D.   On: 10/19/2021 18:56   Labs:   Basic Metabolic Panel: Recent Labs  Lab 10/19/21 1819 10/20/21 0319 10/20/21 1132  NA 139 140  --   K 3.9 3.3*  --   CL 106 108  --   CO2 20* 21*  --   GLUCOSE 91 73  --   BUN 79* 83*  --   CREATININE 3.18* 2.95*  --   CALCIUM 8.3* 7.8*  --   MG  --  1.0* 1.2*     CBC: Recent Labs  Lab 10/19/21 1819 10/20/21 0319  WBC 11.1* 7.5  NEUTROABS 8.9*  --   HGB 9.3* 8.7*  HCT 29.7* 26.8*  MCV 84.6 84.0  PLT 202 168         SIGNED:   Debbe Odea, MD  [facility] 10/20/2021, 3:58 PM

## 2021-10-20 NOTE — Assessment & Plan Note (Signed)
Denies any chest pain.  Troponin flat at 20x2.  EKG without acute ischemic changes.  Not on antiplatelets as she is on Eliquis.  Not on statin due to concern for statin associated transaminitis earlier this year.

## 2021-10-20 NOTE — Assessment & Plan Note (Signed)
Continue hydralazine.

## 2021-10-20 NOTE — Assessment & Plan Note (Signed)
Continue home budesonide, Creon, Protonix.

## 2021-10-20 NOTE — Discharge Instructions (Signed)
Please keep feet propped up to the level of you abdomen when sitting and keep 2 pillows under ankles when laying in bed.  Wear compression stockings at all time when you are walking or you have to sit with your legs hanging down.   Please review all of you discharge paperwork on the day of discharge and be sure you have all of your prescribed medications.  Please request your Primary MD to go over all Hospital Tests and Procedure/Radiological results at the follow up Please get all Hospital records sent to your primary MD by signing hospital release before you go home.   In some cases, there will be blood work, cultures and biopsy results pending at the time of your discharge. Please request that your primary care M.D. goes through all the records of your hospital data and follows up on these results.  Please take all your medications with you for your next visit with your Primary MD   Please request your Primary MD to go over all hospital tests and procedure/radiological results at the follow up, please ask your Primary MD to get all Hospital records sent to his/her office.   You must read complete instructions/literature along with all the possible adverse reactions/side effects for all the Medicines you take and that have been prescribed to you. Take any new Medicines after you have completely understood and accpet all the possible adverse reactions/side effects.    Do not drive or operate heavy machinery when taking Pain medications.    Do not take more than prescribed Pain, Sleep and Anxiety Medications  If you have smoked or chewed Tobacco  in the last 2 yrs please stop smoking, stop any regular Alcohol  and or any Recreational drug use.   Wear Seat belts while driving.   If you had Pneumonia or Lung problems at the Hospital: Please get a 2 view Chest X ray done in 6-8 weeks after hospital discharge or sooner if instructed by your Primary MD.   If you have Congestive Heart  Failure: Please call your Cardiologist or Primary MD anytime you have any of the following symptoms:  1) 3 pound weight gain in 24 hours or 5 pounds in 1 week  2) shortness of breath, with or without a dry hacking cough  3) swelling in the hands, feet or stomach  4) if you have to sleep on extra pillows at night in order to breathe 5) Follow cardiac low salt diet and 1.5 lit/day fluid restriction.   If you have Diabetes Accuchecks 4 times/day- once on AM empty stomach and then before each meal. Log in all results and show them to your primary doctor at your next visit. If any glucose reading is under 60 or above 400 call your primary MD immediately.   If you have Seizure/Convulsions/Epilepsy: Please do not drive, operate heavy machinery, participate in activities at heights or participate in high speed sports until you have seen by Primary MD or a Neurologist and advised to do so again. Per [facility] statutes, patients with seizures are not allowed to drive until they have been seizure-free for six months.  Use caution when using heavy equipment or power tools. Avoid working on ladders or at heights. Take showers instead of baths. Ensure the water temperature is not too high on the home water heater. Do not go swimming alone. Do not lock yourself in a room alone (i.e. bathroom). When caring for infants or small children, sit down when holding, feeding,  or changing them to minimize risk of injury to the child in the event you have a seizure. Maintain good sleep hygiene. Avoid alcohol.    If you had Gastrointestinal Bleeding: Please ask your Primary MD to check a complete blood count within one week of discharge or at your next visit. Your endoscopic/colonoscopic biopsies that are pending at the time of discharge, will also need to followed by your Primary MD.  Please note You were cared for by a hospitalist during your hospital stay. If you have any questions about your discharge  medications or the care you received while you were in the hospital after you are discharged, you can call the unit and asked to speak with the hospitalist on call if the hospitalist that took care of you is not available. Once you are discharged, your primary care physician will handle any further medical issues. Please note that NO REFILLS for any discharge medications will be authorized once you are discharged, as it is imperative that you return to your primary care physician (or establish a relationship with a primary care physician if you do not have one) for your aftercare needs so that they can reassess your need for medications and monitor your lab values.   You can reach the hospitalist office at phone [phone] or fax [phone]   If you do not have a primary care physician, you can call [phone] for a physician referral.

## 2021-10-20 NOTE — Assessment & Plan Note (Addendum)
No longer on statin due to concern for statin associated transaminitis.

## 2021-10-20 NOTE — Assessment & Plan Note (Signed)
Hemoglobin slightly decreased from recent baseline at 9.3.  No obvious bleeding.  Continue to monitor.

## 2021-10-20 NOTE — Assessment & Plan Note (Signed)
TTE 06/2020 showed EF 55-60% with G1DD.  Appears volume overloaded on admission as above. -Monitor response to Lasix and diurese further as needed -Strict I/O's and daily weights

## 2021-10-23 ENCOUNTER — Emergency Department (HOSPITAL_BASED_OUTPATIENT_CLINIC_OR_DEPARTMENT_OTHER): Payer: Medicare Other | Admitting: Radiology

## 2021-10-23 ENCOUNTER — Encounter (HOSPITAL_BASED_OUTPATIENT_CLINIC_OR_DEPARTMENT_OTHER): Payer: Self-pay

## 2021-10-23 ENCOUNTER — Inpatient Hospital Stay (HOSPITAL_BASED_OUTPATIENT_CLINIC_OR_DEPARTMENT_OTHER)
Admission: EM | Admit: 2021-10-23 | Discharge: 2021-10-30 | DRG: 291 | Disposition: A | Discharge status: Home / Self Care | Payer: Medicare Other | Attending: Internal Medicine | Admitting: Internal Medicine

## 2021-10-23 DIAGNOSIS — E538 Deficiency of other specified B group vitamins: Secondary | ICD-10-CM | POA: Diagnosis present

## 2021-10-23 DIAGNOSIS — N2581 Secondary hyperparathyroidism of renal origin: Secondary | ICD-10-CM | POA: Diagnosis present

## 2021-10-23 DIAGNOSIS — I10 Essential (primary) hypertension: Secondary | ICD-10-CM | POA: Diagnosis present

## 2021-10-23 DIAGNOSIS — E8770 Fluid overload, unspecified: Secondary | ICD-10-CM | POA: Diagnosis not present

## 2021-10-23 DIAGNOSIS — I248 Other forms of acute ischemic heart disease: Secondary | ICD-10-CM | POA: Diagnosis not present

## 2021-10-23 DIAGNOSIS — F32A Depression, unspecified: Secondary | ICD-10-CM | POA: Diagnosis present

## 2021-10-23 DIAGNOSIS — I823 Embolism and thrombosis of renal vein: Secondary | ICD-10-CM | POA: Diagnosis not present

## 2021-10-23 DIAGNOSIS — N179 Acute kidney failure, unspecified: Secondary | ICD-10-CM | POA: Diagnosis not present

## 2021-10-23 DIAGNOSIS — Z7901 Long term (current) use of anticoagulants: Secondary | ICD-10-CM

## 2021-10-23 DIAGNOSIS — K2930 Chronic superficial gastritis without bleeding: Secondary | ICD-10-CM | POA: Diagnosis not present

## 2021-10-23 DIAGNOSIS — D509 Iron deficiency anemia, unspecified: Secondary | ICD-10-CM | POA: Diagnosis present

## 2021-10-23 DIAGNOSIS — E1122 Type 2 diabetes mellitus with diabetic chronic kidney disease: Secondary | ICD-10-CM | POA: Diagnosis present

## 2021-10-23 DIAGNOSIS — Z8616 Personal history of COVID-19: Secondary | ICD-10-CM | POA: Diagnosis not present

## 2021-10-23 DIAGNOSIS — Z90710 Acquired absence of both cervix and uterus: Secondary | ICD-10-CM

## 2021-10-23 DIAGNOSIS — I4510 Unspecified right bundle-branch block: Secondary | ICD-10-CM | POA: Diagnosis present

## 2021-10-23 DIAGNOSIS — Z635 Disruption of family by separation and divorce: Secondary | ICD-10-CM

## 2021-10-23 DIAGNOSIS — I130 Hypertensive heart and chronic kidney disease with heart failure and stage 1 through stage 4 chronic kidney disease, or unspecified chronic kidney disease: Principal | ICD-10-CM | POA: Diagnosis present

## 2021-10-23 DIAGNOSIS — E876 Hypokalemia: Secondary | ICD-10-CM | POA: Diagnosis present

## 2021-10-23 DIAGNOSIS — E7800 Pure hypercholesterolemia, unspecified: Secondary | ICD-10-CM | POA: Diagnosis not present

## 2021-10-23 DIAGNOSIS — I5033 Acute on chronic diastolic (congestive) heart failure: Secondary | ICD-10-CM | POA: Diagnosis not present

## 2021-10-23 DIAGNOSIS — Z8249 Family history of ischemic heart disease and other diseases of the circulatory system: Secondary | ICD-10-CM

## 2021-10-23 DIAGNOSIS — E871 Hypo-osmolality and hyponatremia: Secondary | ICD-10-CM | POA: Diagnosis not present

## 2021-10-23 DIAGNOSIS — E785 Hyperlipidemia, unspecified: Secondary | ICD-10-CM | POA: Diagnosis present

## 2021-10-23 DIAGNOSIS — Z832 Family history of diseases of the blood and blood-forming organs and certain disorders involving the immune mechanism: Secondary | ICD-10-CM

## 2021-10-23 DIAGNOSIS — D631 Anemia in chronic kidney disease: Secondary | ICD-10-CM | POA: Diagnosis present

## 2021-10-23 DIAGNOSIS — Z801 Family history of malignant neoplasm of trachea, bronchus and lung: Secondary | ICD-10-CM

## 2021-10-23 DIAGNOSIS — N184 Chronic kidney disease, stage 4 (severe): Secondary | ICD-10-CM | POA: Diagnosis present

## 2021-10-23 DIAGNOSIS — I152 Hypertension secondary to endocrine disorders: Secondary | ICD-10-CM | POA: Diagnosis not present

## 2021-10-23 DIAGNOSIS — E559 Vitamin D deficiency, unspecified: Secondary | ICD-10-CM | POA: Diagnosis not present

## 2021-10-23 DIAGNOSIS — I272 Pulmonary hypertension, unspecified: Secondary | ICD-10-CM | POA: Diagnosis not present

## 2021-10-23 DIAGNOSIS — I2510 Atherosclerotic heart disease of native coronary artery without angina pectoris: Secondary | ICD-10-CM | POA: Diagnosis not present

## 2021-10-23 DIAGNOSIS — Z955 Presence of coronary angioplasty implant and graft: Secondary | ICD-10-CM

## 2021-10-23 DIAGNOSIS — E1169 Type 2 diabetes mellitus with other specified complication: Secondary | ICD-10-CM | POA: Diagnosis not present

## 2021-10-23 DIAGNOSIS — Z7902 Long term (current) use of antithrombotics/antiplatelets: Secondary | ICD-10-CM

## 2021-10-23 DIAGNOSIS — Z8673 Personal history of transient ischemic attack (TIA), and cerebral infarction without residual deficits: Secondary | ICD-10-CM

## 2021-10-23 DIAGNOSIS — K2950 Unspecified chronic gastritis without bleeding: Secondary | ICD-10-CM | POA: Diagnosis present

## 2021-10-23 DIAGNOSIS — R0602 Shortness of breath: Secondary | ICD-10-CM | POA: Diagnosis not present

## 2021-10-23 DIAGNOSIS — Z833 Family history of diabetes mellitus: Secondary | ICD-10-CM

## 2021-10-23 DIAGNOSIS — E1159 Type 2 diabetes mellitus with other circulatory complications: Secondary | ICD-10-CM | POA: Diagnosis not present

## 2021-10-23 DIAGNOSIS — Z923 Personal history of irradiation: Secondary | ICD-10-CM | POA: Diagnosis not present

## 2021-10-23 DIAGNOSIS — G4700 Insomnia, unspecified: Secondary | ICD-10-CM | POA: Diagnosis not present

## 2021-10-23 DIAGNOSIS — G479 Sleep disorder, unspecified: Secondary | ICD-10-CM | POA: Diagnosis present

## 2021-10-23 DIAGNOSIS — N189 Chronic kidney disease, unspecified: Secondary | ICD-10-CM | POA: Diagnosis not present

## 2021-10-23 DIAGNOSIS — Z79899 Other long term (current) drug therapy: Secondary | ICD-10-CM

## 2021-10-23 DIAGNOSIS — Z86718 Personal history of other venous thrombosis and embolism: Secondary | ICD-10-CM

## 2021-10-23 DIAGNOSIS — Z803 Family history of malignant neoplasm of breast: Secondary | ICD-10-CM

## 2021-10-23 DIAGNOSIS — Z800 Family history of malignant neoplasm of digestive organs: Secondary | ICD-10-CM

## 2021-10-23 DIAGNOSIS — E119 Type 2 diabetes mellitus without complications: Secondary | ICD-10-CM | POA: Diagnosis present

## 2021-10-23 LAB — CBC WITH DIFFERENTIAL/PLATELET
Abs Immature Granulocytes: 0.36 10*3/uL — ABNORMAL HIGH (ref 0.00–0.07)
Basophils Absolute: 0.1 10*3/uL (ref 0.0–0.1)
Basophils Relative: 0 %
Eosinophils Absolute: 0.2 10*3/uL (ref 0.0–0.5)
Eosinophils Relative: 1 %
HCT: 30.1 % — ABNORMAL LOW (ref 36.0–46.0)
Hemoglobin: 9.3 g/dL — ABNORMAL LOW (ref 12.0–15.0)
Immature Granulocytes: 3 %
Lymphocytes Relative: 11 %
Lymphs Abs: 1.5 10*3/uL (ref 0.7–4.0)
MCH: 26.9 pg (ref 26.0–34.0)
MCHC: 30.9 g/dL (ref 30.0–36.0)
MCV: 87 fL (ref 80.0–100.0)
Monocytes Absolute: 0.8 10*3/uL (ref 0.1–1.0)
Monocytes Relative: 5 %
Neutro Abs: 11.2 10*3/uL — ABNORMAL HIGH (ref 1.7–7.7)
Neutrophils Relative %: 80 %
Platelets: 244 10*3/uL (ref 150–400)
RBC: 3.46 MIL/uL — ABNORMAL LOW (ref 3.87–5.11)
RDW: 20.8 % — ABNORMAL HIGH (ref 11.5–15.5)
WBC: 14 10*3/uL — ABNORMAL HIGH (ref 4.0–10.5)
nRBC: 0 % (ref 0.0–0.2)

## 2021-10-23 LAB — COMPREHENSIVE METABOLIC PANEL
ALT: 12 U/L (ref 0–44)
AST: 14 U/L — ABNORMAL LOW (ref 15–41)
Albumin: 3.9 g/dL (ref 3.5–5.0)
Alkaline Phosphatase: 96 U/L (ref 38–126)
Anion gap: 16 — ABNORMAL HIGH (ref 5–15)
BUN: 83 mg/dL — ABNORMAL HIGH (ref 8–23)
CO2: 20 mmol/L — ABNORMAL LOW (ref 22–32)
Calcium: 10 mg/dL (ref 8.9–10.3)
Chloride: 106 mmol/L (ref 98–111)
Creatinine, Ser: 3.36 mg/dL — ABNORMAL HIGH (ref 0.44–1.00)
GFR, Estimated: 14 mL/min — ABNORMAL LOW (ref >60)
Glucose, Bld: 126 mg/dL — ABNORMAL HIGH (ref 70–99)
Potassium: 4.3 mmol/L (ref 3.5–5.1)
Sodium: 142 mmol/L (ref 135–145)
Total Bilirubin: 0.3 mg/dL (ref 0.3–1.2)
Total Protein: 7.7 g/dL (ref 6.5–8.1)

## 2021-10-23 LAB — BRAIN NATRIURETIC PEPTIDE: B Natriuretic Peptide: 352.1 pg/mL — ABNORMAL HIGH (ref 0.0–100.0)

## 2021-10-23 MED ORDER — FUROSEMIDE 10 MG/ML IJ SOLN
40.0000 mg | Freq: Once | INTRAMUSCULAR | Status: AC
  Administered 2021-10-24: 40 mg via INTRAVENOUS
  Filled 2021-10-23: qty 4

## 2021-10-23 NOTE — ED Triage Notes (Signed)
Pt returning here today with increased SHOB.   Dc'd from hospital on Friday. Difficulty even talking Bilateral LE 2+edema Not on any diuretics

## 2021-10-24 ENCOUNTER — Other Ambulatory Visit: Payer: Self-pay

## 2021-10-24 ENCOUNTER — Observation Stay (HOSPITAL_BASED_OUTPATIENT_CLINIC_OR_DEPARTMENT_OTHER): Payer: Medicare Other

## 2021-10-24 ENCOUNTER — Encounter (HOSPITAL_COMMUNITY): Payer: Self-pay

## 2021-10-24 ENCOUNTER — Encounter (HOSPITAL_COMMUNITY): Payer: Self-pay | Admitting: Internal Medicine

## 2021-10-24 DIAGNOSIS — Z8616 Personal history of COVID-19: Secondary | ICD-10-CM | POA: Diagnosis not present

## 2021-10-24 DIAGNOSIS — E559 Vitamin D deficiency, unspecified: Secondary | ICD-10-CM | POA: Diagnosis not present

## 2021-10-24 DIAGNOSIS — D509 Iron deficiency anemia, unspecified: Secondary | ICD-10-CM | POA: Diagnosis not present

## 2021-10-24 DIAGNOSIS — I130 Hypertensive heart and chronic kidney disease with heart failure and stage 1 through stage 4 chronic kidney disease, or unspecified chronic kidney disease: Secondary | ICD-10-CM | POA: Diagnosis present

## 2021-10-24 DIAGNOSIS — I5033 Acute on chronic diastolic (congestive) heart failure: Secondary | ICD-10-CM | POA: Diagnosis not present

## 2021-10-24 DIAGNOSIS — I152 Hypertension secondary to endocrine disorders: Secondary | ICD-10-CM | POA: Diagnosis not present

## 2021-10-24 DIAGNOSIS — I248 Other forms of acute ischemic heart disease: Secondary | ICD-10-CM | POA: Diagnosis not present

## 2021-10-24 DIAGNOSIS — Z923 Personal history of irradiation: Secondary | ICD-10-CM | POA: Diagnosis not present

## 2021-10-24 DIAGNOSIS — E1169 Type 2 diabetes mellitus with other specified complication: Secondary | ICD-10-CM | POA: Diagnosis not present

## 2021-10-24 DIAGNOSIS — E785 Hyperlipidemia, unspecified: Secondary | ICD-10-CM | POA: Diagnosis not present

## 2021-10-24 DIAGNOSIS — I823 Embolism and thrombosis of renal vein: Secondary | ICD-10-CM | POA: Diagnosis not present

## 2021-10-24 DIAGNOSIS — I4510 Unspecified right bundle-branch block: Secondary | ICD-10-CM | POA: Diagnosis not present

## 2021-10-24 DIAGNOSIS — N2581 Secondary hyperparathyroidism of renal origin: Secondary | ICD-10-CM | POA: Diagnosis not present

## 2021-10-24 DIAGNOSIS — I2510 Atherosclerotic heart disease of native coronary artery without angina pectoris: Secondary | ICD-10-CM | POA: Diagnosis not present

## 2021-10-24 DIAGNOSIS — E876 Hypokalemia: Secondary | ICD-10-CM | POA: Diagnosis not present

## 2021-10-24 DIAGNOSIS — F32A Depression, unspecified: Secondary | ICD-10-CM | POA: Diagnosis not present

## 2021-10-24 DIAGNOSIS — E871 Hypo-osmolality and hyponatremia: Secondary | ICD-10-CM | POA: Diagnosis not present

## 2021-10-24 DIAGNOSIS — D631 Anemia in chronic kidney disease: Secondary | ICD-10-CM | POA: Diagnosis not present

## 2021-10-24 DIAGNOSIS — N184 Chronic kidney disease, stage 4 (severe): Secondary | ICD-10-CM | POA: Diagnosis not present

## 2021-10-24 DIAGNOSIS — E538 Deficiency of other specified B group vitamins: Secondary | ICD-10-CM | POA: Diagnosis not present

## 2021-10-24 DIAGNOSIS — G4700 Insomnia, unspecified: Secondary | ICD-10-CM | POA: Diagnosis not present

## 2021-10-24 DIAGNOSIS — E1122 Type 2 diabetes mellitus with diabetic chronic kidney disease: Secondary | ICD-10-CM | POA: Diagnosis not present

## 2021-10-24 DIAGNOSIS — N179 Acute kidney failure, unspecified: Secondary | ICD-10-CM | POA: Diagnosis not present

## 2021-10-24 LAB — BASIC METABOLIC PANEL
Anion gap: 11 (ref 5–15)
BUN: 82 mg/dL — ABNORMAL HIGH (ref 8–23)
CO2: 24 mmol/L (ref 22–32)
Calcium: 9.2 mg/dL (ref 8.9–10.3)
Chloride: 108 mmol/L (ref 98–111)
Creatinine, Ser: 2.99 mg/dL — ABNORMAL HIGH (ref 0.44–1.00)
GFR, Estimated: 16 mL/min — ABNORMAL LOW (ref >60)
Glucose, Bld: 88 mg/dL (ref 70–99)
Potassium: 4.5 mmol/L (ref 3.5–5.1)
Sodium: 143 mmol/L (ref 135–145)

## 2021-10-24 LAB — CBC
HCT: 28.8 % — ABNORMAL LOW (ref 36.0–46.0)
Hemoglobin: 8.8 g/dL — ABNORMAL LOW (ref 12.0–15.0)
MCH: 26.4 pg (ref 26.0–34.0)
MCHC: 30.6 g/dL (ref 30.0–36.0)
MCV: 86.5 fL (ref 80.0–100.0)
Platelets: 224 10*3/uL (ref 150–400)
RBC: 3.33 MIL/uL — ABNORMAL LOW (ref 3.87–5.11)
RDW: 20.5 % — ABNORMAL HIGH (ref 11.5–15.5)
WBC: 13.4 10*3/uL — ABNORMAL HIGH (ref 4.0–10.5)
nRBC: 0 % (ref 0.0–0.2)

## 2021-10-24 LAB — ECHOCARDIOGRAM COMPLETE
AR max vel: 1.85 cm2
AV Peak grad: 8.3 mmHg
Ao pk vel: 1.44 m/s
Area-P 1/2: 5.55 cm2
Height: 64 in
S' Lateral: 2.2 cm
Weight: 2403.2 oz

## 2021-10-24 LAB — GLUCOSE, CAPILLARY
Glucose-Capillary: 89 mg/dL (ref 70–99)
Glucose-Capillary: 116 mg/dL — ABNORMAL HIGH (ref 70–99)
Glucose-Capillary: 106 mg/dL — ABNORMAL HIGH (ref 70–99)

## 2021-10-24 LAB — MAGNESIUM: Magnesium: 1.7 mg/dL (ref 1.7–2.4)

## 2021-10-24 LAB — TROPONIN I (HIGH SENSITIVITY)
Troponin I (High Sensitivity): 18 ng/L — ABNORMAL HIGH (ref <18)
Troponin I (High Sensitivity): 16 ng/L (ref <18)

## 2021-10-24 MED ORDER — POLYETHYLENE GLYCOL 3350 17 G PO PACK: 17.0000 g | PACK | Freq: Every day | ORAL | Status: DC | PRN

## 2021-10-24 MED ORDER — HYDRALAZINE HCL 20 MG/ML IJ SOLN: 5.0000 mg | INTRAMUSCULAR | Status: DC | PRN

## 2021-10-24 MED ORDER — HYDRALAZINE HCL 50 MG PO TABS
50.0000 mg | ORAL_TABLET | Freq: Two times a day (BID) | ORAL | Status: DC
  Administered 2021-10-24 – 2021-10-25 (×2): 50 mg via ORAL
  Filled 2021-10-24 (×2): qty 1

## 2021-10-24 MED ORDER — PANCRELIPASE (LIP-PROT-AMYL) 12000-38000 UNITS PO CPEP
36000.0000 [IU] | ORAL_CAPSULE | Freq: Three times a day (TID) | ORAL | Status: DC
  Administered 2021-10-24 – 2021-10-30 (×18): 36000 [IU] via ORAL
  Filled 2021-10-24 (×18): qty 3

## 2021-10-24 MED ORDER — FUROSEMIDE 10 MG/ML IJ SOLN
80.0000 mg | Freq: Two times a day (BID) | INTRAMUSCULAR | Status: DC
  Administered 2021-10-24 – 2021-10-29 (×10): 80 mg via INTRAVENOUS
  Filled 2021-10-24 (×10): qty 8

## 2021-10-24 MED ORDER — ONDANSETRON HCL 4 MG/2ML IJ SOLN: 4.0000 mg | Freq: Four times a day (QID) | INTRAMUSCULAR | Status: DC | PRN

## 2021-10-24 MED ORDER — ONDANSETRON HCL 4 MG PO TABS: 4.0000 mg | ORAL_TABLET | Freq: Four times a day (QID) | ORAL | Status: DC | PRN

## 2021-10-24 MED ORDER — ACETAMINOPHEN 650 MG RE SUPP: 650.0000 mg | Freq: Four times a day (QID) | RECTAL | Status: DC | PRN

## 2021-10-24 MED ORDER — ACETAMINOPHEN 325 MG PO TABS
650.0000 mg | ORAL_TABLET | Freq: Four times a day (QID) | ORAL | Status: DC | PRN
  Administered 2021-10-25 – 2021-10-29 (×4): 650 mg via ORAL
  Filled 2021-10-24 (×4): qty 2

## 2021-10-24 MED ORDER — FLUOXETINE HCL 20 MG PO CAPS
20.0000 mg | ORAL_CAPSULE | Freq: Every day | ORAL | Status: DC
  Administered 2021-10-24 – 2021-10-30 (×7): 20 mg via ORAL
  Filled 2021-10-24 (×7): qty 1

## 2021-10-24 MED ORDER — MIRTAZAPINE 15 MG PO TABS
7.5000 mg | ORAL_TABLET | Freq: Every day | ORAL | Status: DC
  Administered 2021-10-25 – 2021-10-29 (×6): 7.5 mg via ORAL
  Filled 2021-10-24 (×6): qty 1

## 2021-10-24 MED ORDER — SODIUM CHLORIDE 0.9% FLUSH
3.0000 mL | Freq: Two times a day (BID) | INTRAVENOUS | Status: DC
  Administered 2021-10-24 – 2021-10-30 (×10): 3 mL via INTRAVENOUS

## 2021-10-24 MED ORDER — INSULIN ASPART 100 UNIT/ML IJ SOLN: 0.0000 [IU] | Freq: Three times a day (TID) | INTRAMUSCULAR | Status: DC

## 2021-10-24 MED ORDER — FUROSEMIDE 10 MG/ML IJ SOLN
40.0000 mg | Freq: Two times a day (BID) | INTRAMUSCULAR | Status: DC
  Administered 2021-10-24: 40 mg via INTRAVENOUS
  Filled 2021-10-24: qty 4

## 2021-10-24 MED ORDER — APIXABAN 2.5 MG PO TABS
2.5000 mg | ORAL_TABLET | Freq: Two times a day (BID) | ORAL | Status: DC
  Administered 2021-10-24 – 2021-10-30 (×13): 2.5 mg via ORAL
  Filled 2021-10-24 (×13): qty 1

## 2021-10-24 MED ORDER — LOPERAMIDE HCL 2 MG PO CAPS
4.0000 mg | ORAL_CAPSULE | Freq: Four times a day (QID) | ORAL | Status: DC | PRN
Start: 2021-10-24 — End: 2021-10-28

## 2021-10-24 MED ORDER — TRAZODONE HCL 50 MG PO TABS
25.0000 mg | ORAL_TABLET | Freq: Every evening | ORAL | Status: DC | PRN
  Administered 2021-10-25 (×2): 25 mg via ORAL
  Filled 2021-10-24 (×5): qty 1

## 2021-10-24 MED ORDER — BISACODYL 5 MG PO TBEC: 5.0000 mg | DELAYED_RELEASE_TABLET | Freq: Every day | ORAL | Status: DC | PRN

## 2021-10-24 MED ORDER — FAMOTIDINE 20 MG PO TABS
20.0000 mg | ORAL_TABLET | Freq: Every day | ORAL | Status: DC
  Administered 2021-10-25 – 2021-10-29 (×6): 20 mg via ORAL
  Filled 2021-10-24 (×6): qty 1

## 2021-10-24 MED ORDER — PANTOPRAZOLE SODIUM 40 MG PO TBEC
40.0000 mg | DELAYED_RELEASE_TABLET | Freq: Every day | ORAL | Status: DC
  Administered 2021-10-24 – 2021-10-30 (×7): 40 mg via ORAL
  Filled 2021-10-24 (×7): qty 1

## 2021-10-24 MED ORDER — BUDESONIDE 3 MG PO CPEP
3.0000 mg | ORAL_CAPSULE | Freq: Every day | ORAL | Status: DC
  Administered 2021-10-24 – 2021-10-30 (×7): 3 mg via ORAL
  Filled 2021-10-24 (×7): qty 1

## 2021-10-24 NOTE — Evaluation (Signed)
Physical Therapy Evaluation & Discharge Patient Details Name: [name] MRN: [id number] DOB: [birth year] Today's Date: 10/24/2021  History of Present Illness  Pt is a 76 y.o. female admitted 10/23/21 with continued SOB, BLE swelling and orthopnea. Of note, recent admission 8/3-10/20/21 with AKI and CHF, then d/c home. Other PMH includes CKD IV, HTN, CHF, CAD, DVT, CVA, DM2, gout, chronic gastritis, breast CA.   Clinical Impression  Patient evaluated by Physical Therapy with no further acute PT needs identified. PTA, pt independent, lives with son, does not drive. Today, pt moving well, indep with mobility and ADL tasks; no DOE noted, but pt endorses still feeling more SOB with activity than baseline. All education has been completed and the patient has no further questions. Acute PT is signing off. Thank you for this referral.    SpO2 98% on RA, HR 89    Recommendations for follow up therapy are one component of a multi-disciplinary discharge planning process, led by the attending physician.  Recommendations may be updated based on patient status, additional functional criteria and insurance authorization.  Follow Up Recommendations No PT follow up      Assistance Recommended at Discharge PRN  Patient can return home with the following  Assistance with cooking/housework;Assist for transportation    Equipment Recommendations None recommended by PT  Recommendations for Other Services       Functional Status Assessment Patient has not had a recent decline in their functional status     Precautions / Restrictions Precautions Precautions: None Restrictions Weight Bearing Restrictions: No      Mobility  Bed Mobility               General bed mobility comments: received sitting EOB    Transfers Overall transfer level: Independent Equipment used: None                    Ambulation/Gait Ambulation/Gait assistance: Independent Gait Distance (Feet): 180  Feet Assistive device: None Gait Pattern/deviations: Step-through pattern, Decreased stride length Gait velocity: Decreased     General Gait Details: slow, mostly steady gait indep without DME; no SOB noted, pt declines further distance, no reason specified  Stairs Stairs:  (pt declines stair training)          Wheelchair Mobility    Modified Rankin (Stroke Patients Only)       Balance Overall balance assessment: No apparent balance deficits (not formally assessed)                           High level balance activites: Side stepping, Direction changes, Turns, Sudden stops, Head turns High Level Balance Comments: slight instability noted with initial turning, pt self-corrected; stability improving with ambulation distance             Pertinent Vitals/Pain Pain Assessment Pain Assessment: No/denies pain    Home Living Family/patient expects to be discharged to:: Private residence Living Arrangements: Children Available Help at Discharge: Family;Available PRN/intermittently Type of Home: House Home Access: Level entry     Alternate Level Stairs-Number of Steps: 12 Home Layout: Two level;Able to live on main level with bedroom/bathroom Home Equipment: Rolling Walker (2 wheels);Rollator (4 wheels)      Prior Function Prior Level of Function : Independent/Modified Independent             Mobility Comments: Independent without DME; does not drive. Reports increased difficulty doing more strenuous things around house, like vacuuming ADLs Comments: [employer] Givens  to shower; family assists with iADLS as needed. Daughter does med Retail buyer        Extremity/Trunk Assessment   Upper Extremity Assessment Upper Extremity Assessment: Overall WFL for tasks assessed    Lower Extremity Assessment Lower Extremity Assessment: Overall WFL for tasks assessed    Cervical / Trunk Assessment Cervical / Trunk Assessment: Normal  Communication    Communication: No difficulties  Cognition Arousal/Alertness: Awake/alert Behavior During Therapy: WFL for tasks assessed/performed, Flat affect Overall Cognitive Status: Within Functional Limits for tasks assessed                                          General Comments General comments (skin integrity, edema, etc.): educ re: activity recommendations, energy conservation strategies with mobility    Exercises     Assessment/Plan    PT Assessment Patient does not need any further PT services  PT Problem List         PT Treatment Interventions      PT Goals (Current goals can be found in the Care Plan section)  Acute Rehab PT Goals PT Goal Formulation: All assessment and education complete, DC therapy    Frequency       Co-evaluation               AM-PAC PT "6 Clicks" Mobility  Outcome Measure Help needed turning from your back to your side while in a flat bed without using bedrails?: None Help needed moving from lying on your back to sitting on the side of a flat bed without using bedrails?: None Help needed moving to and from a bed to a chair (including a wheelchair)?: None Help needed standing up from a chair using your arms (e.g., wheelchair or bedside chair)?: None Help needed to walk in hospital room?: None Help needed climbing 3-5 steps with a railing? : None 6 Click Score: 24    End of Session   Activity Tolerance: Patient tolerated treatment well Patient left: in chair;with call bell/phone within reach Nurse Communication: Mobility status;Other (comment) (RN ok with no chair alarm, pt mobilizing indep) PT Visit Diagnosis: Other abnormalities of gait and mobility (R26.89)    Time: 2800-3491 PT Time Calculation (min) (ACUTE ONLY): 13 min   Charges:   PT Evaluation $PT Eval Low Complexity: 1 Low        Mabeline Caras, PT, DPT Acute Rehabilitation Services  Personal: [facility] Office: [place] 10/24/2021, 9:03 AM

## 2021-10-24 NOTE — Progress Notes (Addendum)
Patient arrived to unit, [facility] notified. Telemetry initiated and verified. VSS. Patient resting and awaiting orders.  Patient has a sore in mouth that she would like addressed.

## 2021-10-24 NOTE — Consult Note (Addendum)
Cardiology Consultation:   Patient ID: [name] MRN: [id number]; DOB: 06-12-1945  Admit date: 10/23/2021 Date of Consult: 10/24/2021  PCP:  Katie Carol, MD   [name] Providers Cardiologist:  Katie Rouge, MD        Patient Profile:   [name] is a 76 y.o. female with a hx of ARF on CKD IV (d/c 10/20/2021), HFpEF, DM2, HTN, HLD, CAD w/ PTCA Diag 1996 & 2006 DES LAD 2014, Crohn's dz s/p ileocolectomy w/ anastomosis, lupus, CVA, 2nd hyperparathyroidism,IDA and chronic dz anemia, who is being seen 10/24/2021 for the evaluation of CHF at the request of [name].   History of CAD with [facility] of the LAD and balloon angioplasty of  diagonal branch in 1996 and subsequent rotablation and angioplasty of the diagonal in 2000, DES LAD 2006. Chronic dyspnea from diastolic dysfunction and anemia. Volume overload from renal failure. She had a poor quality myovue 02/27/18 but no obvious ischemia. TTE [name] 06/30/20 EF 55-60% mild LVH only impaired relaxation and mild MR    May 2019 admitted with sepsis syndrome pneumonia She had alveolar hemorrhage and brief PEA Arrest post bronchoscopy and was intubated for days and d/c to inpatient rehab    Breast cancer and sees [name] was on Arimidex but hospitalized in July 2019  with SBO pneumonia and pancreatic cyst. Noted to have renal vein thrombosis and placed on xarelto for 6 months stopped 01/2018    06/29/20 Right perioneal DVT to be on eliquis 3 months  Admitted 07/20/20 with volume overload d/c on Demedex 40 mg daily  Admitted 09/03/20 with nausea/gastritis  and anemia Got dehydrated And Cr elevated to 4.9  Admitted 09/23/20 w/ acute on chronic RF, Cr only rebounded to 3 range, she was d/c on 20 mg Toresemide LFTls elevated and statin held  Admitted twice 11/2020, the first time with near-syncope, possible dehydration (d/c on Lasix 40 mg qd) and the 2nd time w/ volume overload, Acute on Chronic CKD (d/c on Lasix 20 mg qd prn) Admitted twice  in 2023, d/c dose of Lasix 20 mg prn was continued Cards was not consulted on any of these admits.   History of Present Illness:   [name] was admitted 08/03-08/04 w/ ARF on CKD IV, Cr had been 3.51 at 04/06 [facility] office visit, was 3.18 on admit, 2.95 at d/c. Pt had seen Katie [name] in the past, was to follow up with Nephrology. D/c wt 68 kg.  Lasix dose at d/c was 20 mg daily prn. This has been her dose for a while.  Nephrology saw her several times in the hospital in 2022, last time 12/02/2020 in the hospital.   Katie [name] saw her in the office 10/19/2020 and mentioned Katie [name] as her Nephrologist.   She was admitted 08/07 with SOB and LE edema, Cards asked to see.   She does not weigh daily but can. PTA, she was taking Lasix 20 mg qd.   When she left the hospital, she thought she was doing ok, lives w/ dtr-in-law and family. She was having some SOB w/ ambulation but felt ok.  Her daughter visited her every day, pt feels her daughter made her come it. She was described as unable to speak full sentences and w/ increased LE edema.   She does not feel any different than she did at d/c and says no change from this admission, does not remember being too SOB to talk.   There are 4 people in  the house, her daughter-in-law, 2 grandchildren and her.  She feels like she eats a low-sodium diet.  She notices that her legs are enlarged, and asks if there is any medicine that will help them.   Past Medical History:  Diagnosis Date   Abdominal discomfort    Chronic N/V/D. Presumptive dx Crohn's dx per elevated p ANCA. Failed Entocort and Pentasa. Sep 2003 - ileocolectomy c anastomosis per [name] 2/2 adhesions - path was hegative for Crohns. EGD, Sm bowel follow through (11/03), and an eteroclysis (10/03) were unrevealing. Cuases hypomag and hypocalcemia.   Adnexal mass 8/03   s/p lap BSO (R ovarian fibroma) & lysis of adhesions   Allergy    Seasonal   Anemia    Multifactorial. Baseline  HgB 10-11 ish. B12 def - 150 in 3/10. Fe Def - ferritin 35 3/10. Both are being repleted.   Breast cancer ([place]) 03/16/13   right, 5 o'clock   CAD (coronary artery disease) 1996   1996 - PTCA and angioplasty diagonal branch. 2000 - Rotoblator & angiopllasty of diagonal. 2006 - subendocardial AMI, DES to proximal LAD.Marland Kitchen Also had 90% stenosis in distal apical LAD. EF 55 with apical hypokinesis. Indefinite ASA and Plavix.   CHF (congestive heart failure) (HCC)    Chronic kidney disease    Chronic renal insuff baseline Cr 1.2 - 1.4 ish.   Chronic kidney disease, stage III (moderate) (HCC)    Chronic renal insuff baseline Cr 1.2 - 1.4 ish. New baseline about 2 (2018)   Chronic pain    CT 10/10 = Spinal stenosis L2 - S1.   COVID-19 11/02/2020   Diabetes mellitus    Insulin dependent   Diabetes mellitus type 2 in obese ([place]) 05/03/2010   Managed on lantus and novolog. Has diabetic nephropathy. Metformin D/C'd 2012 2/2 creatinine.  No diabetic retinopathy per 3/11.    Gout    Hx of radiation therapy 06/02/13- 07/16/13   right rbeast 4500 cGy 25 sessions, right breast boost 1600 cGy in 8 sessions   Hyperlipidemia    Managed with both a statin and Welchol. Welchol stopped 2014 2/2 cost and started on fenofibrate    Hypertension    2006 B renal arteries patent. 2003 MRA - no RAS. 2003 pheo W/U [name] reportedly negative.   Hypoxia 07/23/2017   Lupus ([place])    Lymphedema of breast    Personal history of radiation therapy 2015   Pneumonia of left lung due to infectious organism    RBBB    Renal vein thrombosis (HCC)    SBO (small bowel obstruction) ([place]) 09/17/2017   Secondary hyperparathyroidism ([place])    Stroke ([place])    Incidental finding MRI 2002 L lacunar infarct   Vitamin B12 deficiency    Vitamin D deficiency    Wears dentures    top    Past Surgical History:  Procedure Laterality Date   ABDOMINAL HYSTERECTOMY     BILATERAL SALPINGOOPHORECTOMY  8/03   Lap BSO (R ovarian fibroma) and  adhesion lysis   BIOPSY  09/08/2020   Procedure: BIOPSY;  Surgeon: Ronald Lobo, MD;  Location: [facility];  Service: Endoscopy;;   BOWEL RESECTION  2003   ileocolectomy with anastomosis 2/2 adhesions   BREAST BIOPSY Right 2015   BREAST BIOPSY Right 07/2014   BREAST LUMPECTOMY Right 04/22/2013   BREAST LUMPECTOMY WITH NEEDLE LOCALIZATION AND AXILLARY SENTINEL LYMPH NODE BX Right 04/22/2013   Procedure: BREAST LUMPECTOMY WITH NEEDLE LOCALIZATION AND AXILLARY SENTINEL LYMPH NODE  BX;  Surgeon: Stark Klein, MD;  Location: [place];  Service: General;  Laterality: Right;   CARDIAC CATHETERIZATION     2 stents   CHOLECYSTECTOMY     COLONOSCOPY     ESOPHAGOGASTRODUODENOSCOPY N/A 09/08/2020   Procedure: ESOPHAGOGASTRODUODENOSCOPY (EGD);  Surgeon: Ronald Lobo, MD;  Location: [facility];  Service: Endoscopy;  Laterality: N/A;   HEMICOLECTOMY     R sided hemicolectomy   HERNIA REPAIR     Ventral hernia repair   PTCA  4/06     Home Medications:  Prior to Admission medications   Medication Sig Start Date End Date Taking? Authorizing Provider  acetaminophen (TYLENOL) 500 MG tablet Take 1,000 mg by mouth every 6 (six) hours as needed for moderate pain or headache.   Yes [provider]  Artificial Tear Ointment (DRY EYES OP) Place 1 drop into the right eye 2 (two) times daily as needed (dry eye).   Yes [provider]  budesonide (ENTOCORT EC) 3 MG 24 hr capsule Take 1 capsule by mouth once daily Patient taking differently: Take 3 mg by mouth daily. 11/30/20  Yes Gatha Mayer, MD  Cyanocobalamin (B-12 COMPLIANCE INJECTION IJ) Inject 1 vial as directed every 30 (thirty) days.   Yes [provider]  ergocalciferol (VITAMIN D2) 1.25 MG (50000 UT) capsule Take 1 capsule (50,000 Units total) by mouth once a week. 08/02/20  Yes Truitt Merle, MD  famotidine (PEPCID) 20 MG tablet Take 20 mg by mouth at bedtime. 10/14/21  Yes [provider]   FLUoxetine (PROZAC) 20 MG capsule Take 20 mg by mouth daily. 10/12/21  Yes [provider]  furosemide (LASIX) 20 MG tablet Take 1 tablet (20 mg total) by mouth daily as needed (weight gain of >3Lbs in 1 day or >5 Lbs in 2 days.). Patient taking differently: Take 20 mg by mouth daily as needed for fluid (weight gain of >3Lbs in 1 day or >5 Lbs in 2 days.). 12/10/20  Yes Lavina Hamman, MD  hydrALAZINE (APRESOLINE) 100 MG tablet Take 0.5 tablets (50 mg total) by mouth 2 (two) times daily. 09/24/21  Yes Josue Hector, MD  lipase/protease/amylase (CREON) 36000 UNITS CPEP capsule Take 36,000 Units by mouth 3 (three) times daily with meals.   Yes [provider]  loperamide (IMODIUM A-D) 2 MG tablet Take 4 mg by mouth 4 (four) times daily as needed for diarrhea or loose stools.   Yes [provider]  mirtazapine (REMERON) 7.5 MG tablet Take 1 tablet (7.5 mg total) by mouth at bedtime. 12/22/20  Yes Truitt Merle, MD  nitroGLYCERIN (NITROSTAT) 0.4 MG SL tablet DISSOLVE 1 TABLET UNDER THE TONGUE EVERY 5 MINUTES AS  NEEDED FOR CHEST PAIN. MAX  OF 3 TABLETS IN 15 MINUTES. CALL 911 IF PAIN PERSISTS. Patient taking differently: Place 0.4 mg under the tongue every 5 (five) minutes as needed for chest pain. 10/04/20  Yes Jerline Pain, MD  ondansetron (ZOFRAN-ODT) 4 MG disintegrating tablet DISSOLVE 1 TABLET IN MOUTH EVERY 8 HOURS AS NEEDED FOR NAUSEA OR VOMITING Patient taking differently: Take 4 mg by mouth every 8 (eight) hours as needed for nausea or vomiting. 09/28/21  Yes Truitt Merle, MD  pantoprazole (PROTONIX) 40 MG tablet TAKE 1 TABLET BY MOUTH  DAILY BEFORE BREAKFAST Patient taking differently: Take 40 mg by mouth daily. 10/24/20  Yes Gatha Mayer, MD  apixaban (ELIQUIS) 2.5 MG TABS tablet Take 1 tablet (2.5 mg total) by mouth 2 (two)  times daily. Patient not taking: Reported on 10/20/2021 11/25/20   Jonetta Osgood, MD    Inpatient Medications: Scheduled Meds:  apixaban  2.5 mg  Oral BID   budesonide  3 mg Oral Daily   famotidine  20 mg Oral QHS   FLUoxetine  20 mg Oral Daily   furosemide  40 mg Intravenous BID   hydrALAZINE  50 mg Oral BID   lipase/protease/amylase  36,000 Units Oral TID WC   mirtazapine  7.5 mg Oral QHS   pantoprazole  40 mg Oral Daily   sodium chloride flush  3 mL Intravenous Q12H   Continuous Infusions:  PRN Meds: acetaminophen **OR** acetaminophen, bisacodyl, hydrALAZINE, loperamide, ondansetron **OR** ondansetron (ZOFRAN) IV, polyethylene glycol, traZODone  Allergies:    Allergies  Allergen Reactions   Percocet [Oxycodone-Acetaminophen] Other (See Comments)    headaches   Diazepam Other (See Comments)    Agitation    Haloperidol Lactate Nausea And Vomiting   Lorazepam Nausea Only   Morphine Sulfate Other (See Comments)    Agitation   Propoxyphene Hcl Nausea And Vomiting   Tramadol Hcl Nausea And Vomiting   Anesthetics, Halogenated Other (See Comments)    Per patient's daughter, she can not tolerate anesthetics.    Etomidate Other (See Comments)    Heart stopped during anesthesia   Fentanyl Other (See Comments)    Heart stopped during anesthesia    Haloperidol Nausea And Vomiting   Morphine Sulfate Nausea Only   Oxycodone-Acetaminophen Other (See Comments)    headaches   Propoxyphene Nausea And Vomiting   Tramadol Hcl Nausea And Vomiting   Versed [Midazolam] Other (See Comments)    Heart stopped during anesthesia    Social History:   Social History   Socioeconomic History   Marital status: Legally Separated    Spouse name: Not on file   Number of children: 5   Years of education: Not on file   Highest education level: Not on file  Occupational History   Occupation: retired    Fish farm manager: [employer]  Tobacco Use   Smoking status: Never   Smokeless tobacco: Never  Vaping Use   Vaping Use: Never used  Substance and Sexual Activity   Alcohol use: No    Alcohol/week: 0.0 standard drinks of alcohol    Drug use: No   Sexual activity: Not on file  Other Topics Concern   Not on file  Social History Narrative   She is separated 2 sons 3 daughters   She has grandchildren and great-grandchildren   She is retired   Never smoker no tobacco now no drugs no alcohol no caffeine   Social Determinants of Radio broadcast assistant Strain: Not on file  Food Insecurity: Food Insecurity Present (09/12/2020)   Hunger Vital Sign    Worried About Running Out of Food in the Last Year: Sometimes true    [place] in the Last Year: Sometimes true  Transportation Needs: No Transportation Needs (09/12/2020)   PRAPARE - Hydrologist (Medical): No    Lack of Transportation (Non-Medical): No  Physical Activity: Not on file  Stress: Not on file  Social Connections: Not on file  Intimate Partner Violence: Not on file    Family History:   Family History  Problem Relation Age of Onset   Pancreatic cancer Brother 77   Breast cancer Mother 35   Lung cancer Maternal Aunt    Breast cancer Maternal Aunt  Prostate cancer Maternal Uncle    Heart attack Maternal Grandmother    Breast cancer Maternal Grandmother    Colon cancer Maternal Grandmother    Clotting disorder Maternal Grandmother    Diabetes Maternal Grandmother    Kidney failure Maternal Aunt    Hypertension Daughter      ROS:  Please see the history of present illness.  All other ROS reviewed and negative.     Physical Exam/Data:   Vitals:   10/24/21 0441 10/24/21 0442 10/24/21 0728 10/24/21 1051  BP:  (!) 142/90 (!) 143/70 135/61  Pulse:  93 91 85  Resp:  14 17 18   Temp:  98.4 F (36.9 C) 98.2 F (36.8 C) 98 F (36.7 C)  TempSrc:  Oral Oral Oral  SpO2:  98% 96% 97%  Weight: 68.1 kg     Height: 5' 4"  (1.626 m)       Intake/Output Summary (Last 24 hours) at 10/24/2021 1515 Last data filed at 10/24/2021 1317 Gross per 24 hour  Intake 357 ml  Output 1250 ml  Net -893 ml      10/24/2021    4:41  AM 10/23/2021    8:07 PM 10/20/2021    4:11 AM  Last 3 Weights  Weight (lbs) 150 lb 3.2 oz 150 lb 149 lb 14.6 oz  Weight (kg) 68.13 kg 68.04 kg 68 kg     Body mass index is 25.78 kg/m.  General:  Well nourished, well developed, in no acute distress HEENT: normal Neck: JVD 8-9 cm Vascular: No carotid bruits; Distal pulses 2+ bilaterally Cardiac:  normal S1, S2; RRR; 2/6 murmur  Lungs:  rales bases bilaterally, no wheezing, rhonchi Abd: soft, nontender, no hepatomegaly  Ext: 1-2+ LE edema Musculoskeletal:  No deformities, BUE and BLE strength normal and equal Skin: warm and dry  Neuro:  CNs 2-12 intact, no focal abnormalities noted Psych:  Normal affect   EKG:  The EKG was personally reviewed and demonstrates:  SR, HR 92, RBBB is old, QRS duration 136 ms is not sig different Telemetry:  Telemetry was personally reviewed and demonstrates:  SR  Relevant CV Studies:  ECHO: 06/30/2020  1. Left ventricular ejection fraction, by estimation, is 55 to 60%. The  left ventricle has normal function. The left ventricle has no regional  wall motion abnormalities. There is mild left ventricular hypertrophy.  Left ventricular diastolic parameters  are consistent with Grade I diastolic dysfunction (impaired relaxation).   2. Right ventricular systolic function is normal. The right ventricular  size is normal. There is mildly elevated pulmonary artery systolic  pressure. The estimated right ventricular systolic pressure is 00.3 mmHg.   3. Left atrial size was mildly dilated.   4. The mitral valve is normal in structure. Mild mitral valve  regurgitation. No evidence of mitral stenosis.   5. The aortic valve is tricuspid. Aortic valve regurgitation is not  visualized. No aortic stenosis is present.   6. The inferior vena cava is dilated in size with >50% respiratory  variability, suggesting right atrial pressure of 8 mmHg.   PCI/CARDIAC CATH: 06/30/2004 PCI:  IMPRESSION/PLAN:  Successful  drug-eluting stent placement in the proximal  LAD.  The patient will be continued on Plavix for one year.  Aspirin will be continued indefinitely.  She will be ambulated in two hours.  Attention will be given to improved hypertension control. CARDIAC CATH: 06/30/2004  ANGIOGRAPHIC FINDINGS:  1.  The left main coronary artery is free of significant __________ coronary atherosclerosis.  2.  The left anterior descending is a medium caliber vessel with a large      bifurcating proximal diagonal branch and two smaller distal branches.      There is approximately 80% hypodense stenosis in the proximal left      anterior descending followed by 40% vessel stenosis, 30% distal stenosis and 40% apical stenosis. The large proximal bifurcating diagonal branch has a 50% ostial stenosis and 50% proximal stenosis but no major flow limiting disease.  3.  The circumflex coronary artery is a medium to large caliber vessel with four obtuse marginal branches. The first obtuse marginal branch has a 30% stenosis and there are other minor Luminal irregularities within the      circumflex proper including a 20% mid vessel stenosis.  4.  The right coronary artery is a dominant vessel that is medium in      caliber. There is 20% proximal vessel stenosis and a 50% mid vessel      stenosis.  5.  Selective renal angiography was performed showing patent bilateral renal arteries without significant flow limiting obstruction.  6.  Left ventriculography is performed in the RAO projection revealing an      ejection fraction greater than 55% in the setting of ventricular ectopy      with 1+ mitral regurgitation. There also looks to be some degree of      apical hypokinesis.    DIAGNOSES:  1.  Single vessel obstructive coronary artery disease of the left anterior      descending in the proximal vessel presumably at or near the previous      arthrectomy site. The proximal diagonal branch has disease that is      nonobstructive.  No other major flow limiting disease is noted in the      remaining coronaries.  2.  Patent bilateral renal arteries.  3.  __________ ejection fraction greater than 55% with apical hypokinesis      and 1+ mitral regurgitation in the setting of ectopy. Left ventricular      diastolic pressure is 16 mmHg.  4.  Systemic hypertension.   Laboratory Data:  High Sensitivity Troponin:   Recent Labs  Lab 10/19/21 1819 10/19/21 2009 10/23/21 2012 10/24/21 0214  TROPONINIHS 20* 20* 18* 16     Chemistry Recent Labs  Lab 10/20/21 0319 10/20/21 1132 10/23/21 2012 10/24/21 0735  NA 140  --  142 143  K 3.3*  --  4.3 4.5  CL 108  --  106 108  CO2 21*  --  20* 24  GLUCOSE 73  --  126* 88  BUN 83*  --  83* 82*  CREATININE 2.95*  --  3.36* 2.99*  CALCIUM 7.8*  --  10.0 9.2  MG 1.0* 1.2*  --  1.7  GFRNONAA 16*  --  14* 16*  ANIONGAP 11  --  16* 11    Recent Labs  Lab 10/19/21 1819 10/23/21 2012  PROT 6.8 7.7  ALBUMIN 3.6 3.9  AST 20 14*  ALT 13 12  ALKPHOS 66 96  BILITOT 0.3 0.3   Lipids No results for input(s): "CHOL", "TRIG", "HDL", "LABVLDL", "LDLCALC", "CHOLHDL" in the last 168 hours.  Hematology Recent Labs  Lab 10/20/21 0319 10/23/21 2012 10/24/21 0735  WBC 7.5 14.0* 13.4*  RBC 3.19* 3.46* 3.33*  HGB 8.7* 9.3* 8.8*  HCT 26.8* 30.1* 28.8*  MCV 84.0 87.0 86.5  MCH 27.3 26.9 26.4  MCHC 32.5 30.9 30.6  RDW 20.2*  20.8* 20.5*  PLT 168 244 224   Thyroid No results for input(s): "TSH", "FREET4" in the last 168 hours.  BNP Recent Labs  Lab 10/19/21 1819 10/23/21 2012  BNP 338.1* 352.1*    DDimer No results for input(s): "DDIMER" in the last 168 hours. Lab Results  Component Value Date   IRON 33 07/20/2021   TIBC 386 07/20/2021   FERRITIN 36 06/22/2021     Radiology/Studies:  DG Chest 2 View  Result Date: 10/23/2021 CLINICAL DATA:  Shortness of breath.  Lower extremity edema. EXAM: CHEST - 2 VIEW COMPARISON:  10/19/2021 FINDINGS: Cardiomegaly. Aortic  tortuosity. Coronary artery stent. Pulmonary venous hypertension with mild edema. Very small amount of pleural fluid. No lobar consolidation or collapse. IMPRESSION: Pulmonary venous hypertension with mild edema consistent with fluid overload/congestive heart failure. Electronically Signed   By: Nelson Chimes M.D.   On: 10/23/2021 20:45     Assessment and Plan:   Acute on chronic diastolic CHF - +volume overload on exam, on CXR and BNP higher than previous admit - she got 1 dose of Lasix 40 mg IV early this am, another at 10 am, with 1.2 L UPO after that, wt no change - Cr 3.36 >> 2.96 after the first dose - suspect she was not at baseline at recent d/c, she does not report sx consistently and has poor insight into what is going on. - suspect she would benefit from higher dose Lasix, possibly 80 mg bid - consider Nephrology consult to help manage diuretic therapy  2. CKD IV - GFR now < 20 at 18 - did not have RAS at her cath in 2006 and by [place] 2019 - if not seen by Nephrology in-hosp, make sure she follows up after d/c  3. CAD - s/p DES LAD 2006, she had mild-mod dz in multiple other vessels at that time. - feel mild trop elevation more c/w demand ischemia from CHF - She reports no ischemic sx - she was on nevibolol 12/02/2020 admit, d/c'd because of being weak and light-headed. All hypertensives held, Nevibolol never restarted - no bradycardia, SBP 122-143 since admit, no bradycardia. - discuss w/ MD restarting the BB, only hypertensive med right now is hydralazine 50 mg bid  Otherwise, per IM   Risk Assessment/Risk Scores:       New York Heart Association (NYHA) Functional Class NYHA Class III   For questions or updates, please contact [facility] Please consult [email or web address] for contact info under    Signed, Rosaria Ferries, PA-C  10/24/2021 3:15 PM

## 2021-10-24 NOTE — Progress Notes (Signed)
Mobility Specialist - Progress Note   10/24/21 1512  Mobility  Activity Ambulated with assistance in hallway  Level of Assistance Modified independent, requires aide device or extra time  Assistive Device Other (Comment) (Handrail)  Distance Ambulated (ft) 600 ft  Activity Response Tolerated well  $Mobility charge 1 Mobility    Pt was received in chair and agreeable to mobility. Pt used handrails to maintain balance but no other faults throughout ambulation. Pt was returned to chair with all needs met.    Larey Seat

## 2021-10-24 NOTE — H&P (Signed)
History and Physical    Patient: [name] OHY:[id number] DOB: September 27, 1945 DOA: 10/23/2021 DOS: the patient was seen and examined on 10/24/2021 PCP: Seward Carol, MD  Patient coming from: Home - lives with daughter-in-law and grandchildren; NOK: Cherlyn Syring, [phone]   Chief Complaint: SOB  HPI: [name] is a 76 y.o. female with medical history significant of stage 4 CKD; chronic diastolic CHF; CAD s/p stent; DVT; CVA: DM; HTN; and chronic gastritis presenting with SOB. She was last admitted with similar presentation from 8/3-4 and improved with Lasix.  She thought she was doing well, was shiopping yesterday.  Her granddaughter called 911, said she was breathing too hard. She feels good today.  LE were twice as swollen yesterday but better today.  She has been taking her medications without difficulty. Lungs feel good today.  She has not seen the nephrologists yet and has not yet called for an appointment.    I spoke with her daughter.  She has significant tachypnea with exertion.  She was changed from torsemide -> furosemide -> off.  Family is very concerned.  She also has limited ROM of LUE, not wiping well; would like OT evaluation.    ER Course:  [facility] transfer, per Dr. Alcario Drought:  Dene Gentry for HFpEF, volume overload, worsening renal function.  CXR shows pulm edema.  Creat 3.3  EDP spoke with Dr. Joylene Grapes who said he'd be happy to help if we felt services needed.  Although it looks like creat improved with lasix diuresis during overnight admit thurs-Friday x4 days ago.  Got lasix in ED again.     Review of Systems: As mentioned in the history of present illness. All other systems reviewed and are negative. Past Medical History:  Diagnosis Date   Abdominal discomfort    Chronic N/V/D. Presumptive dx Crohn's dx per elevated p ANCA. Failed Entocort and Pentasa. Sep 2003 - ileocolectomy c anastomosis per Dr Deon Pilling 2/2 adhesions - path was hegative  for Crohns. EGD, Sm bowel follow through (11/03), and an eteroclysis (10/03) were unrevealing. Cuases hypomag and hypocalcemia.   Adnexal mass 8/03   s/p lap BSO (R ovarian fibroma) & lysis of adhesions   Allergy    Seasonal   Anemia    Multifactorial. Baseline HgB 10-11 ish. B12 def - 150 in 3/10. Fe Def - ferritin 35 3/10. Both are being repleted.   Breast cancer ([place]) 03/16/13   right, 5 o'clock   CAD (coronary artery disease) 1996   1996 - PTCA and angioplasty diagonal branch. 2000 - Rotoblator & angiopllasty of diagonal. 2006 - subendocardial AMI, DES to proximal LAD.Marland Kitchen Also had 90% stenosis in distal apical LAD. EF 55 with apical hypokinesis. Indefinite ASA and Plavix.   CHF (congestive heart failure) (HCC)    Chronic kidney disease    Chronic renal insuff baseline Cr 1.2 - 1.4 ish.   Chronic kidney disease, stage III (moderate) (HCC)    Chronic renal insuff baseline Cr 1.2 - 1.4 ish. New baseline about 2 (2018)   Chronic pain    CT 10/10 = Spinal stenosis L2 - S1.   COVID-19 11/02/2020   Diabetes mellitus    Insulin dependent   Diabetes mellitus type 2 in obese ([place]) 05/03/2010   Managed on lantus and novolog. Has diabetic nephropathy. Metformin D/C'd 2012 2/2 creatinine.  No diabetic retinopathy per 3/11.    Gout    Hx of radiation therapy 06/02/13- 07/16/13   right rbeast 4500 cGy 25 sessions, right  breast boost 1600 cGy in 8 sessions   Hyperlipidemia    Managed with both a statin and Welchol. Welchol stopped 2014 2/2 cost and started on fenofibrate    Hypertension    2006 B renal arteries patent. 2003 MRA - no RAS. 2003 pheo W/U Dr Hassell Done reportedly negative.   Hypoxia 07/23/2017   Lupus ([place])    Lymphedema of breast    Personal history of radiation therapy 2015   Pneumonia of left lung due to infectious organism    RBBB    Renal vein thrombosis (HCC)    SBO (small bowel obstruction) ([place]) 09/17/2017   Secondary hyperparathyroidism ([place])    Stroke ([place])    Incidental finding  MRI 2002 L lacunar infarct   Vitamin B12 deficiency    Vitamin D deficiency    Wears dentures    top   Past Surgical History:  Procedure Laterality Date   ABDOMINAL HYSTERECTOMY     BILATERAL SALPINGOOPHORECTOMY  8/03   Lap BSO (R ovarian fibroma) and adhesion lysis   BIOPSY  09/08/2020   Procedure: BIOPSY;  Surgeon: Ronald Lobo, MD;  Location: [facility];  Service: Endoscopy;;   BOWEL RESECTION  2003   ileocolectomy with anastomosis 2/2 adhesions   BREAST BIOPSY Right 2015   BREAST BIOPSY Right 07/2014   BREAST LUMPECTOMY Right 04/22/2013   BREAST LUMPECTOMY WITH NEEDLE LOCALIZATION AND AXILLARY SENTINEL LYMPH NODE BX Right 04/22/2013   Procedure: BREAST LUMPECTOMY WITH NEEDLE LOCALIZATION AND AXILLARY SENTINEL LYMPH NODE BX;  Surgeon: Stark Klein, MD;  Location: [place];  Service: General;  Laterality: Right;   CARDIAC CATHETERIZATION     2 stents   CHOLECYSTECTOMY     COLONOSCOPY     ESOPHAGOGASTRODUODENOSCOPY N/A 09/08/2020   Procedure: ESOPHAGOGASTRODUODENOSCOPY (EGD);  Surgeon: Ronald Lobo, MD;  Location: [facility];  Service: Endoscopy;  Laterality: N/A;   HEMICOLECTOMY     R sided hemicolectomy   HERNIA REPAIR     Ventral hernia repair   PTCA  4/06   Social History:  reports that she has never smoked. She has never used smokeless tobacco. She reports that she does not drink alcohol and does not use drugs.  Allergies  Allergen Reactions   Percocet [Oxycodone-Acetaminophen] Other (See Comments)    headaches   Diazepam Other (See Comments)    Agitation    Haloperidol Lactate Nausea And Vomiting   Lorazepam Nausea Only   Morphine Sulfate Other (See Comments)    Agitation   Propoxyphene Hcl Nausea And Vomiting   Tramadol Hcl Nausea And Vomiting   Anesthetics, Halogenated Other (See Comments)    Per patient's daughter, she can not tolerate anesthetics.    Etomidate Other (See Comments)    Heart stopped during anesthesia   Fentanyl Other  (See Comments)    Heart stopped during anesthesia    Haloperidol Nausea And Vomiting   Morphine Sulfate Nausea Only   Oxycodone-Acetaminophen Other (See Comments)    headaches   Propoxyphene Nausea And Vomiting   Tramadol Hcl Nausea And Vomiting   Versed [Midazolam] Other (See Comments)    Heart stopped during anesthesia    Family History  Problem Relation Age of Onset   Pancreatic cancer Brother 17   Breast cancer Mother 40   Lung cancer Maternal Aunt    Breast cancer Maternal Aunt    Prostate cancer Maternal Uncle    Heart attack Maternal Grandmother    Breast cancer Maternal Grandmother    Colon cancer Maternal  Grandmother    Clotting disorder Maternal Grandmother    Diabetes Maternal Grandmother    Kidney failure Maternal Aunt    Hypertension Daughter     Prior to Admission medications   Medication Sig Start Date End Date Taking? Authorizing Provider  acetaminophen (TYLENOL) 500 MG tablet Take 1,000 mg by mouth every 6 (six) hours as needed for moderate pain or headache.    [provider]  apixaban (ELIQUIS) 2.5 MG TABS tablet Take 1 tablet (2.5 mg total) by mouth 2 (two) times daily. Patient not taking: Reported on 10/20/2021 11/25/20   Jonetta Osgood, MD  Artificial Tear Ointment (DRY EYES OP) Place 1 drop into the right eye 2 (two) times daily as needed (dry eye).    [provider]  budesonide (ENTOCORT EC) 3 MG 24 hr capsule Take 1 capsule by mouth once daily Patient taking differently: Take 3 mg by mouth daily. 11/30/20   Gatha Mayer, MD  Cyanocobalamin (B-12 COMPLIANCE INJECTION IJ) Inject 1 vial as directed every 30 (thirty) days.    [provider]  Elastic Bandages & Supports (T.E.D. BELOW KNEE/MEDIUM) MISC 1 each by Does not apply route daily. Measure legs and give appropriate size. 10/20/21   Debbe Odea, MD  ergocalciferol (VITAMIN D2) 1.25 MG (50000 UT) capsule Take 1 capsule (50,000 Units total) by mouth once a week. 08/02/20    Truitt Merle, MD  famotidine (PEPCID) 20 MG tablet Take 20 mg by mouth at bedtime. 10/14/21   [provider]  FLUoxetine (PROZAC) 20 MG capsule Take 20 mg by mouth daily. 10/12/21   [provider]  furosemide (LASIX) 20 MG tablet Take 1 tablet (20 mg total) by mouth daily as needed (weight gain of >3Lbs in 1 day or >5 Lbs in 2 days.). Patient taking differently: Take 20 mg by mouth daily as needed for fluid (weight gain of >3Lbs in 1 day or >5 Lbs in 2 days.). 12/10/20   Lavina Hamman, MD  hydrALAZINE (APRESOLINE) 100 MG tablet Take 0.5 tablets (50 mg total) by mouth 2 (two) times daily. 09/24/21   Josue Hector, MD  lipase/protease/amylase (CREON) 36000 UNITS CPEP capsule Take 36,000 Units by mouth 3 (three) times daily with meals.    [provider]  loperamide (IMODIUM A-D) 2 MG tablet Take 4 mg by mouth 4 (four) times daily as needed for diarrhea or loose stools.    [provider]  mirtazapine (REMERON) 7.5 MG tablet Take 1 tablet (7.5 mg total) by mouth at bedtime. 12/22/20   Truitt Merle, MD  nitroGLYCERIN (NITROSTAT) 0.4 MG SL tablet DISSOLVE 1 TABLET UNDER THE TONGUE EVERY 5 MINUTES AS  NEEDED FOR CHEST PAIN. MAX  OF 3 TABLETS IN 15 MINUTES. CALL 911 IF PAIN PERSISTS. Patient taking differently: Place 0.4 mg under the tongue every 5 (five) minutes as needed for chest pain. 10/04/20   Jerline Pain, MD  ondansetron (ZOFRAN-ODT) 4 MG disintegrating tablet DISSOLVE 1 TABLET IN MOUTH EVERY 8 HOURS AS NEEDED FOR NAUSEA OR VOMITING Patient taking differently: Take 4 mg by mouth every 8 (eight) hours as needed for nausea or vomiting. 09/28/21   Truitt Merle, MD  pantoprazole (PROTONIX) 40 MG tablet TAKE 1 TABLET BY MOUTH  DAILY BEFORE BREAKFAST Patient taking differently: Take 40 mg by mouth daily. 10/24/20   Gatha Mayer, MD    Physical Exam: Vitals:   10/24/21 0442 10/24/21 0728 10/24/21 1051 10/24/21 1616  BP: (!) 142/90 (!) 143/70  135/61 (!) 134/59  Pulse: 93 91  85 85  Resp: 14 17 18 17   Temp: 98.4 F (36.9 C) 98.2 F (36.8 C) 98 F (36.7 C) 97.6 F (36.4 C)  TempSrc: Oral Oral Oral Oral  SpO2: 98% 96% 97% 98%  Weight:      Height:       General:  Appears calm and comfortable and is in NAD, sitting at bedside on RA Eyes:   EOMI, normal lids, iris ENT:  grossly normal hearing, lips & tongue, mmm Neck:  no LAD, masses or thyromegaly Cardiovascular:  RRR, no m/r/g. 2+ LE edema.  Respiratory:   Fine bibasilar crackles.  Normal respiratory effort. Abdomen:  soft, NT, mildly distended Skin:  no rash or induration seen on limited exam Musculoskeletal:  grossly normal tone BUE/BLE, good ROM, no bony abnormality Psychiatric:  grossly normal mood and affect, speech fluent and appropriate, AOx3 Neurologic:  CN 2-12 grossly intact, moves all extremities in coordinated fashion   Radiological Exams on Admission: Independently reviewed - see discussion in A/P where applicable  ECHOCARDIOGRAM COMPLETE  Result Date: 10/24/2021    ECHOCARDIOGRAM REPORT   Patient Name:   Eleni [name] Weitman Date of Exam: 10/24/2021 Medical Rec #:  [id number]          Height:       64.0 in Accession #:    [id number]         Weight:       150.2 lb Date of Birth:  30-Nov-1945          BSA:          1.732 m Patient Age:    57 years           BP:           135/61 mmHg Patient Gender: F                  HR:           87 bpm. Exam Location:  Inpatient Procedure: 2D Echo, Cardiac Doppler and Color Doppler Indications:    CHF  History:        Patient has prior history of Echocardiogram examinations, most                 recent 06/30/2020. CHF, CAD; Risk Factors:Diabetes, Hypertension                 and Dyslipidemia.  Sonographer:    Jefferey Pica Referring Phys: [place]  1. Left ventricular ejection fraction, by estimation, is 60 to 65%. The left ventricle has normal function. The left ventricle has no regional wall motion abnormalities. Left ventricular diastolic  parameters are indeterminate.  2. Right ventricular systolic function is normal. The right ventricular size is normal. There is moderately elevated pulmonary artery systolic pressure.  3. Left atrial size was mildly dilated.  4. The mitral valve is normal in structure. Mild to moderate mitral valve regurgitation.  5. Tricuspid valve regurgitation is mild to moderate.  6. The aortic valve is tricuspid. Aortic valve regurgitation is not visualized. No aortic stenosis is present.  7. The inferior vena cava is dilated in size with <50% respiratory variability, suggesting right atrial pressure of 15 mmHg. Comparison(s): Prior images reviewed side by side. The left ventricular function is unchanged. There is further increase in estimated right atrial and pulmonary artery pressure. Left ventricular diastolic parameters and mitral insufficiency appear largely unchanged. FINDINGS  Left Ventricle: Left ventricular ejection  fraction, by estimation, is 60 to 65%. The left ventricle has normal function. The left ventricle has no regional wall motion abnormalities. The left ventricular internal cavity size was normal in size. There is  no left ventricular hypertrophy. Left ventricular diastolic parameters are indeterminate. Indeterminate filling pressures. Right Ventricle: The right ventricular size is normal. No increase in right ventricular wall thickness. Right ventricular systolic function is normal. There is moderately elevated pulmonary artery systolic pressure. The tricuspid regurgitant velocity is 3.06 m/s, and with an assumed right atrial pressure of 15 mmHg, the estimated right ventricular systolic pressure is 44.9 mmHg. Left Atrium: Left atrial size was mildly dilated. Right Atrium: Right atrial size was normal in size. Pericardium: There is no evidence of pericardial effusion. Mitral Valve: The mitral valve is normal in structure. Mild to moderate mitral valve regurgitation. Tricuspid Valve: The tricuspid valve is  normal in structure. Tricuspid valve regurgitation is mild to moderate. Aortic Valve: The aortic valve is tricuspid. Aortic valve regurgitation is not visualized. No aortic stenosis is present. Aortic valve peak gradient measures 8.3 mmHg. Pulmonic Valve: The pulmonic valve was normal in structure. Pulmonic valve regurgitation is trivial. Aorta: The aortic root and ascending aorta are structurally normal, with no evidence of dilitation. Venous: The inferior vena cava is dilated in size with less than 50% respiratory variability, suggesting right atrial pressure of 15 mmHg. IAS/Shunts: No atrial level shunt detected by color flow Doppler.  LEFT VENTRICLE PLAX 2D LVIDd:         4.20 cm   Diastology LVIDs:         2.20 cm   LV e' medial:    5.57 cm/s LV PW:         1.10 cm   LV E/e' medial:  16.6 LV IVS:        1.10 cm   LV e' lateral:   7.51 cm/s LVOT diam:     1.90 cm   LV E/e' lateral: 12.3 LV SV:         63 LV SV Index:   37 LVOT Area:     2.84 cm  RIGHT VENTRICLE             IVC RV Basal diam:  2.60 cm     IVC diam: 2.60 cm RV S prime:     13.30 cm/s TAPSE (M-mode): 3.0 cm LEFT ATRIUM             Index        RIGHT ATRIUM           Index LA diam:        4.00 cm 2.31 cm/m   RA Area:     10.70 cm LA Vol (A2C):   59.7 ml 34.46 ml/m  RA Volume:   24.20 ml  13.97 ml/m LA Vol (A4C):   52.1 ml 30.08 ml/m LA Biplane Vol: 58.3 ml 33.66 ml/m  AORTIC VALVE                 PULMONIC VALVE AV Area (Vmax): 1.85 cm     PV Vmax:       0.66 m/s AV Vmax:        144.00 cm/s  PV Peak grad:  1.8 mmHg AV Peak Grad:   8.3 mmHg LVOT Vmax:      94.20 cm/s LVOT Vmean:     57.600 cm/s LVOT VTI:       0.223 m  AORTA Ao Root diam: 3.00 cm Ao  Asc diam:  3.10 cm MITRAL VALVE               TRICUSPID VALVE MV Area (PHT): 5.55 cm    TR Peak grad:   37.5 mmHg MV Decel Time: 137 msec    TR Vmax:        306.00 cm/s MV E velocity: 92.42 cm/s MV A velocity: 93.53 cm/s  SHUNTS MV E/A ratio:  0.99        Systemic VTI:  0.22 m                             Systemic Diam: 1.90 cm Dani Gobble Croitoru MD Electronically signed by Sanda Klein MD Signature Date/Time: 10/24/2021/3:32:23 PM    Final    DG Chest 2 View  Result Date: 10/23/2021 CLINICAL DATA:  Shortness of breath.  Lower extremity edema. EXAM: CHEST - 2 VIEW COMPARISON:  10/19/2021 FINDINGS: Cardiomegaly. Aortic tortuosity. Coronary artery stent. Pulmonary venous hypertension with mild edema. Very small amount of pleural fluid. No lobar consolidation or collapse. IMPRESSION: Pulmonary venous hypertension with mild edema consistent with fluid overload/congestive heart failure. Electronically Signed   By: Nelson Chimes M.D.   On: 10/23/2021 20:45    EKG: Independently reviewed.   2011 - NSR with rate 92; fusion complexes; RBBB; nonspecific ST changes with no evidence of acute ischemia 0021 - NSR with rate 91; RBBB; nonspecific ST changes with no evidence of acute ischemia   Labs on Admission: I have personally reviewed the available labs and imaging studies at the time of the admission.  Pertinent labs:    Glucose 126 BUN 83/Creatinine 3.36/GFR 14; 83/2.95/16 on 8/4 Anion gap 16 BNP 352.1 HS troponin 18, 16 WBC 14 Hgb 9.3   Assessment and Plan: Principal Problem:   Acute on chronic heart failure with preserved ejection fraction (HFpEF) (HCC) Active Problems:   CKD (chronic kidney disease), stage IV (HCC)   Coronary artery disease involving native coronary artery of native heart   Hypertension associated with diabetes ([place])   Type 2 diabetes mellitus with stage 4 chronic kidney disease, without long-term current use of insulin (HCC)   Hyperlipidemia associated with type 2 diabetes mellitus (HCC)   Sleep disturbance   Renal vein thrombosis (HCC)   Chronic gastritis    Acute on chronic diastolic CHF -Patient with known h/o chronic diastolic CHF presenting with worsening SOB and edema -This is a repeat admission, as she was here for the same issue from 8/3-4 -CXR consistent  with mild pulmonary edema -Mildly elevated BNP, similar to last admission -With elevated BNP and abnl CXR, acute decompensated CHF seems probable as diagnosis -Will place in observation status with telemetry, as there are no current findings necessitating admission (hemodynamic instability, severe electrolyte abnormalities, cardiac arrhythmias, ACS, severe pulmonary edema requiring new O2 therapy, AMS) -Will request echocardiogram -Cardiology consulted at family request and because of bounceback admission -CHF order set utilized -Was given Lasix 40 mg x 1 in ER and will repeat with 40 mg IV BID  ?AKI on stage 4 CKD -Renal function is poor at baseline, marginally worse currently -Will follow with diuresis -Needs outpatient nephrology f/u  HTN -Continue PO hydralazine -Will also add prn IV hydralazine  HLD -No longer on statin due to concern for statin associated transaminitis.  DM -Last A1c was 5.5 -Will cover with sensitive-scale SSI for now  CAD -No apparent concern for ACS currently -Negative troponin  Chronic gastritis -  Continue home budesonide, Creon, Protonix, Pepcid  Insomnia -Continue Remeron   H/o DVT, renal vein thrombosis -Continue Eliquis   Advance Care Planning:   Code Status: Full Code   Consults: Cardiology; PT/OT; nutrition; TOC team; CHF navigator  DVT Prophylaxis: Eliquis  Family Communication: None present; I spoke with her daughter Janessa Mickle, NP from psychiatry) at the time of admission  Severity of Illness: The appropriate patient status for this patient is OBSERVATION. Observation status is judged to be reasonable and necessary in order to provide the required intensity of service to ensure the patient's safety. The patient's presenting symptoms, physical exam findings, and initial radiographic and laboratory data in the context of their medical condition is felt to place them at decreased risk for further clinical deterioration. Furthermore,  it is anticipated that the patient will be medically stable for discharge from the hospital within 2 midnights of admission.   Author: Karmen Bongo, MD 10/24/2021 6:29 PM  For on call review [email or web address].

## 2021-10-24 NOTE — ED Notes (Signed)
Per pt call "jaz" (granddaughter) with any updates.

## 2021-10-24 NOTE — Care Management Obs Status (Signed)
[facility] NOTIFICATION   Patient Details  Name: [name] MRN: [id number] Date of Birth: 1945/07/19   Medicare Observation Status Notification Given:  Yes    Zenon Mayo, RN 10/24/2021, 5:22 PM

## 2021-10-24 NOTE — Plan of Care (Signed)
  Problem: Education: Goal: Knowledge of General Education information will improve Description: Including pain rating scale, medication(s)/side effects and non-pharmacologic comfort measures Outcome: Progressing   Problem: Health Behavior/Discharge Planning: Goal: Ability to manage health-related needs will improve Outcome: Progressing   Problem: Activity: Goal: Risk for activity intolerance will decrease Outcome: Progressing   Problem: Clinical Measurements: Goal: Cardiovascular complication will be avoided Outcome: Progressing

## 2021-10-24 NOTE — ED Provider Notes (Signed)
[facility] Emergency Department Provider Note MRN:  [id number]  Arrival date & time: 10/24/21     Chief Complaint   Shortness of Breath   History of Present Illness   [name] is a 76 y.o. year-old female with a history of CHF, CKD presenting to the ED with chief complaint of shortness of breath.  Return of shortness of breath over the past few days.  Was recently in the hospital for diuretics.  Her legs are very swollen again, she is short of breath with any exertion, she cannot lay flat.  No fever, no cough, minimal intermittent chest pain.  No abdominal pain.  18 pound weight gain.  Review of Systems  A thorough review of systems was obtained and all systems are negative except as noted in the HPI and PMH.   Patient's Health History    Past Medical History:  Diagnosis Date   Abdominal discomfort    Chronic N/V/D. Presumptive dx Crohn's dx per elevated p ANCA. Failed Entocort and Pentasa. Sep 2003 - ileocolectomy c anastomosis per Dr Deon Pilling 2/2 adhesions - path was hegative for Crohns. EGD, Sm bowel follow through (11/03), and an eteroclysis (10/03) were unrevealing. Cuases hypomag and hypocalcemia.   Adnexal mass 8/03   s/p lap BSO (R ovarian fibroma) & lysis of adhesions   Allergy    Seasonal   Anemia    Multifactorial. Baseline HgB 10-11 ish. B12 def - 150 in 3/10. Fe Def - ferritin 35 3/10. Both are being repleted.   Breast cancer ([place]) 03/16/13   right, 5 o'clock   CAD (coronary artery disease) 1996   1996 - PTCA and angioplasty diagonal branch. 2000 - Rotoblator & angiopllasty of diagonal. 2006 - subendocardial AMI, DES to proximal LAD.Marland Kitchen Also had 90% stenosis in distal apical LAD. EF 55 with apical hypokinesis. Indefinite ASA and Plavix.   CHF (congestive heart failure) (HCC)    Chronic kidney disease    Chronic renal insuff baseline Cr 1.2 - 1.4 ish.   Chronic kidney disease, stage III (moderate) (HCC)    Chronic renal insuff baseline  Cr 1.2 - 1.4 ish. New baseline about 2 (2018)   Chronic pain    CT 10/10 = Spinal stenosis L2 - S1.   COVID-19 11/02/2020   Diabetes mellitus    Insulin dependent   Diabetes mellitus type 2 in obese ([place]) 05/03/2010   Managed on lantus and novolog. Has diabetic nephropathy. Metformin D/C'd 2012 2/2 creatinine.  No diabetic retinopathy per 3/11.    Gout    Hx of radiation therapy 06/02/13- 07/16/13   right rbeast 4500 cGy 25 sessions, right breast boost 1600 cGy in 8 sessions   Hyperlipidemia    Managed with both a statin and Welchol. Welchol stopped 2014 2/2 cost and started on fenofibrate    Hypertension    2006 B renal arteries patent. 2003 MRA - no RAS. 2003 pheo W/U Dr Hassell Done reportedly negative.   Hypoxia 07/23/2017   Lupus ([place])    Lymphedema of breast    Personal history of radiation therapy 2015   Pneumonia of left lung due to infectious organism    RBBB    Renal vein thrombosis (HCC)    SBO (small bowel obstruction) ([place]) 09/17/2017   Secondary hyperparathyroidism ([place])    Stroke ([place])    Incidental finding MRI 2002 L lacunar infarct   Vitamin B12 deficiency    Vitamin D deficiency    Wears dentures    top  Past Surgical History:  Procedure Laterality Date   ABDOMINAL HYSTERECTOMY     BILATERAL SALPINGOOPHORECTOMY  8/03   Lap BSO (R ovarian fibroma) and adhesion lysis   BIOPSY  09/08/2020   Procedure: BIOPSY;  Surgeon: Ronald Lobo, MD;  Location: [facility];  Service: Endoscopy;;   BOWEL RESECTION  2003   ileocolectomy with anastomosis 2/2 adhesions   BREAST BIOPSY Right 2015   BREAST BIOPSY Right 07/2014   BREAST LUMPECTOMY Right 04/22/2013   BREAST LUMPECTOMY WITH NEEDLE LOCALIZATION AND AXILLARY SENTINEL LYMPH NODE BX Right 04/22/2013   Procedure: BREAST LUMPECTOMY WITH NEEDLE LOCALIZATION AND AXILLARY SENTINEL LYMPH NODE BX;  Surgeon: Stark Klein, MD;  Location: [facility];  Service: General;  Laterality: Right;   CARDIAC CATHETERIZATION     2  stents   CHOLECYSTECTOMY     COLONOSCOPY     ESOPHAGOGASTRODUODENOSCOPY N/A 09/08/2020   Procedure: ESOPHAGOGASTRODUODENOSCOPY (EGD);  Surgeon: Ronald Lobo, MD;  Location: [facility];  Service: Endoscopy;  Laterality: N/A;   HEMICOLECTOMY     R sided hemicolectomy   HERNIA REPAIR     Ventral hernia repair   PTCA  4/06    Family History  Problem Relation Age of Onset   Pancreatic cancer Brother 52   Breast cancer Mother 30   Lung cancer Maternal Aunt    Breast cancer Maternal Aunt    Prostate cancer Maternal Uncle    Heart attack Maternal Grandmother    Breast cancer Maternal Grandmother    Colon cancer Maternal Grandmother    Clotting disorder Maternal Grandmother    Diabetes Maternal Grandmother    Kidney failure Maternal Aunt    Hypertension Daughter     Social History   Socioeconomic History   Marital status: Legally Separated    Spouse name: Not on file   Number of children: 5   Years of education: Not on file   Highest education level: Not on file  Occupational History   Occupation: retired    Fish farm manager: [employer]  Tobacco Use   Smoking status: Never   Smokeless tobacco: Never  Vaping Use   Vaping Use: Never used  Substance and Sexual Activity   Alcohol use: No    Alcohol/week: 0.0 standard drinks of alcohol   Drug use: No   Sexual activity: Not on file  Other Topics Concern   Not on file  Social History Narrative   She is separated 2 sons 3 daughters   She has grandchildren and great-grandchildren   She is retired   Never smoker no tobacco now no drugs no alcohol no caffeine   Social Determinants of Radio broadcast assistant Strain: Not on file  Food Insecurity: Food Insecurity Present (09/12/2020)   Hunger Vital Sign    Worried About Running Out of Food in the Last Year: Sometimes true    Ran Out of Food in the Last Year: Sometimes true  Transportation Needs: No Transportation Needs (09/12/2020)   PRAPARE - Civil engineer, contracting (Medical): No    Lack of Transportation (Non-Medical): No  Physical Activity: Not on file  Stress: Not on file  Social Connections: Not on file  Intimate Partner Violence: Not on file     Physical Exam   Vitals:   10/24/21 0330 10/24/21 0442  BP: (!) 122/59 (!) 142/90  Pulse: 85 93  Resp: 16 14  Temp: 98.4 F (36.9 C) 98.4 F (36.9 C)  SpO2: 96% 98%  CONSTITUTIONAL: Well-appearing, NAD NEURO/PSYCH:  Alert and oriented x 3, no focal deficits EYES:  eyes equal and reactive ENT/NECK:  no LAD, mild JVD CARDIO: Regular rate, well-perfused, normal S1 and S2 PULM:  CTAB no wheezing or rhonchi GI/GU:  non-distended, non-tender MSK/SPINE:  No gross deformities, pitting edema to the bilateral lower extremities SKIN:  no rash, atraumatic   *Additional and/or pertinent findings included in MDM below  Diagnostic and Interventional Summary    EKG Interpretation  Date/Time:  October 24, 2021 and 00:21:06 Ventricular Rate:   91 PR Interval:   132 QRS Duration:  136 QT Interval:  396  QTC Calculation:  488 R Axis:     Text Interpretation: Sinus rhythm, bundle branch block       Labs Reviewed  CBC WITH DIFFERENTIAL/PLATELET - Abnormal; Notable for the following components:      Result Value   WBC 14.0 (*)    RBC 3.46 (*)    Hemoglobin 9.3 (*)    HCT 30.1 (*)    RDW 20.8 (*)    Neutro Abs 11.2 (*)    Abs Immature Granulocytes 0.36 (*)    All other components within normal limits  COMPREHENSIVE METABOLIC PANEL - Abnormal; Notable for the following components:   CO2 20 (*)    Glucose, Bld 126 (*)    BUN 83 (*)    Creatinine, Ser 3.36 (*)    AST 14 (*)    GFR, Estimated 14 (*)    Anion gap 16 (*)    All other components within normal limits  BRAIN NATRIURETIC PEPTIDE - Abnormal; Notable for the following components:   B Natriuretic Peptide 352.1 (*)    All other components within normal limits  TROPONIN I (HIGH SENSITIVITY) - Abnormal; Notable for  the following components:   Troponin I (High Sensitivity) 18 (*)    All other components within normal limits  BASIC METABOLIC PANEL  MAGNESIUM  CBC  TROPONIN I (HIGH SENSITIVITY)    DG Chest 2 View  Final Result      Medications  furosemide (LASIX) injection 40 mg (40 mg Intravenous Given 10/24/21 0024)     Procedures  /  Critical Care Procedures  ED Course and Medical Decision Making  Initial Impression and Ddx Differential diagnosis includes CHF exacerbation, fluid overloaded state in the setting of renal failure, venous insufficiency.  Lasix at home does not seem to be working, awaiting repeat labs, chest x-ray, may need readmission.  Past medical/surgical history that increases complexity of ED encounter: CHF, CKD  Interpretation of Diagnostics I personally reviewed the EKG and my interpretation is as follows: Sinus rhythm, no concerning changes  Labs reveal worsening of kidney function, mild BNP elevation.  Chest x-ray with some evidence of edema  Patient Reassessment and Ultimate Disposition/Management     Admitted to medicine.  Patient management required discussion with the following services or consulting groups:  Hospitalist Service  Complexity of Problems Addressed Acute illness or injury that poses threat of life of bodily function  Additional Data Reviewed and Analyzed Further history obtained from: Further history from spouse/family member  Additional Factors Impacting ED Encounter Risk Consideration of hospitalization  Barth Kirks. [place]onia Small, [place] [email or web address]  Final Clinical Impressions(s) / ED Diagnoses     ICD-10-CM   1. Hypervolemia, unspecified hypervolemia type  E87.70       ED Discharge Orders     None  Discharge Instructions Discussed with and Provided to Patient:   Discharge Instructions   None      Maudie Flakes, MD 10/24/21 [phone]

## 2021-10-24 NOTE — Progress Notes (Signed)
Initial Nutrition Assessment  DOCUMENTATION CODES:   Not applicable  INTERVENTION:  Liberalize diet from a heart healthy/carb modified to a 2 gram sodium diet to provide widest variety of menu options to enhance nutritional adequacy Order snacks BID to optimize nutritional intake MVI with minerals daily  NUTRITION DIAGNOSIS:   Increased nutrient needs related to acute illness as evidenced by estimated needs  GOAL:   Patient will meet greater than or equal to 90% of their needs  MONITOR:   PO intake, Supplement acceptance, Labs, Weight trends, I & O's  REASON FOR ASSESSMENT:   Consult Other (Comment) (nutrition goals)  ASSESSMENT:   Pt admitted from home with SOB r/t acute on chronic CHF. PMH significant for CKD stage IV, chronic diastolic CHF, CAD s/p stent, DVT, CVA, DM, HTN, lupus, Crohn's disease s/p ileocolectomy w/anastomosis and chronic gastritis.  Pt sitting up in chair at time of visit. She reports having a good appetite. She recalls eating 2 and a half meals at home. She typically has breakfast around 11 A which includes grits, eggs and cereal. She usually has a fruit in the afternoon such as a banana or pears. Dinner around 8 is usually something light such as crackers and peanut butter. Pt states that her intake has been decreased lately r/t fluid retention around her lower extremities and stomach leading to early satiety. Since admission her appetite and intake has improve though states that given meal times she is still hungry. She would prefer to have snacks ordered BID to increase nutritional intake rather than Ensure as she does not enjoy these and feels as though these increase her diarrhea.   Pt noted to take creon but she was uncertain why. When asked if she has h/o pancreatic complications, she says "yes" and that she was prescribed this medication during last admission. After review of chart, noted pt has been on creon long term, likely d/t chronic gastritis  versus Chron's. She is unsure how helpful it has been with her digestion. Pt endorses long standing history of diarrhea. States that she has had about 6-7 episodes of diarrhea daily for which she takes imodium with very minimal improvement.   Meal completions:  8/8: 95%-breakfast, 80%-lunch, 100%-dinner 8/9: 100%-breakfast  Pt states that her wt is usually no more than ~135 lbs but states that when she was admitted it was 145 lbs d/t CHF and fluid retention. She had not been weighing herself regularly d/t no batteries in her scale which recently has been replaced.   Reviewed wt history. Pt's wt noted to fluctuate between 60.3-68.4 kg. Current admit wt noted to be 67.3 kg. No significant recent wt loss noted. Suspect dry wt loss could be masked by edema.  Edema: mild pitting BLE  Medications: pepcid, lasix, creon, remeron, protonix  Labs: BUN 83, Cr 3.36, anion gap 16, AST 14, GFR 14, CBG's 75-116 x24 hours  UOP: 2369m x24 hours I/O's: -12770msince admission  NUTRITION - FOCUSED PHYSICAL EXAM: BLE edema could be masking muscle losses.   Flowsheet Row Most Recent Value  Orbital Region No depletion  Upper Arm Region No depletion  Thoracic and Lumbar Region No depletion  Buccal Region No depletion  Temple Region Mild depletion  Clavicle Bone Region Moderate depletion  Clavicle and Acromion Bone Region Mild depletion  Scapular Bone Region No depletion  Dorsal Hand No depletion  Patellar Region No depletion  Anterior Thigh Region No depletion  Posterior Calf Region No depletion  Edema (RD Assessment) None  Hair  Reviewed  Eyes Reviewed  Mouth Reviewed  Skin Reviewed  Nails Reviewed       Diet Order:   Diet Order             Diet 2 gram sodium Room service appropriate? Yes; Fluid consistency: Thin  Diet effective now                   EDUCATION NEEDS:   Education needs have been addressed  Skin:  Skin Assessment: Reviewed RN Assessment  Last BM:   8/8  Height:   Ht Readings from Last 1 Encounters:  10/24/21 5' 4"  (1.626 m)    Weight:   Wt Readings from Last 1 Encounters:  10/25/21 67.3 kg   BMI:  Body mass index is 25.46 kg/m.  Estimated Nutritional Needs:   Kcal:  1700-1900  Protein:  85-100g  Fluid:  >/=1.7L  Clayborne Dana, RDN, LDN Clinical Nutrition

## 2021-10-24 NOTE — ED Notes (Signed)
[facility] arrived to transport pt. Pt stable at time of departure ?

## 2021-10-24 NOTE — Progress Notes (Signed)
Heart Failure Navigator Progress Note  Following this hospitalization to assess for HV TOC readiness.   ECHO pending? Last 55-60% HFpEF  ( 06/2020) CKD 4, Creat 3.36 ( baseline 2.6-2.8)  Earnestine Leys, BSN, RN Heart Failure Transport planner Only

## 2021-10-25 ENCOUNTER — Encounter (HOSPITAL_COMMUNITY): Payer: Self-pay | Admitting: Internal Medicine

## 2021-10-25 DIAGNOSIS — E1169 Type 2 diabetes mellitus with other specified complication: Secondary | ICD-10-CM | POA: Diagnosis present

## 2021-10-25 DIAGNOSIS — E538 Deficiency of other specified B group vitamins: Secondary | ICD-10-CM | POA: Diagnosis present

## 2021-10-25 DIAGNOSIS — I2510 Atherosclerotic heart disease of native coronary artery without angina pectoris: Secondary | ICD-10-CM | POA: Diagnosis present

## 2021-10-25 DIAGNOSIS — E7800 Pure hypercholesterolemia, unspecified: Secondary | ICD-10-CM | POA: Diagnosis not present

## 2021-10-25 DIAGNOSIS — Z923 Personal history of irradiation: Secondary | ICD-10-CM | POA: Diagnosis not present

## 2021-10-25 DIAGNOSIS — E876 Hypokalemia: Secondary | ICD-10-CM | POA: Diagnosis present

## 2021-10-25 DIAGNOSIS — Z8616 Personal history of COVID-19: Secondary | ICD-10-CM | POA: Diagnosis not present

## 2021-10-25 DIAGNOSIS — I152 Hypertension secondary to endocrine disorders: Secondary | ICD-10-CM | POA: Diagnosis present

## 2021-10-25 DIAGNOSIS — I130 Hypertensive heart and chronic kidney disease with heart failure and stage 1 through stage 4 chronic kidney disease, or unspecified chronic kidney disease: Secondary | ICD-10-CM | POA: Diagnosis present

## 2021-10-25 DIAGNOSIS — E1122 Type 2 diabetes mellitus with diabetic chronic kidney disease: Secondary | ICD-10-CM | POA: Diagnosis present

## 2021-10-25 DIAGNOSIS — I4510 Unspecified right bundle-branch block: Secondary | ICD-10-CM | POA: Diagnosis present

## 2021-10-25 DIAGNOSIS — F32A Depression, unspecified: Secondary | ICD-10-CM | POA: Diagnosis present

## 2021-10-25 DIAGNOSIS — I823 Embolism and thrombosis of renal vein: Secondary | ICD-10-CM | POA: Diagnosis present

## 2021-10-25 DIAGNOSIS — E785 Hyperlipidemia, unspecified: Secondary | ICD-10-CM | POA: Diagnosis present

## 2021-10-25 DIAGNOSIS — N179 Acute kidney failure, unspecified: Secondary | ICD-10-CM | POA: Diagnosis present

## 2021-10-25 DIAGNOSIS — N2581 Secondary hyperparathyroidism of renal origin: Secondary | ICD-10-CM | POA: Diagnosis present

## 2021-10-25 DIAGNOSIS — E1159 Type 2 diabetes mellitus with other circulatory complications: Secondary | ICD-10-CM | POA: Diagnosis not present

## 2021-10-25 DIAGNOSIS — G4700 Insomnia, unspecified: Secondary | ICD-10-CM | POA: Diagnosis present

## 2021-10-25 DIAGNOSIS — D631 Anemia in chronic kidney disease: Secondary | ICD-10-CM | POA: Diagnosis present

## 2021-10-25 DIAGNOSIS — K2930 Chronic superficial gastritis without bleeding: Secondary | ICD-10-CM | POA: Diagnosis not present

## 2021-10-25 DIAGNOSIS — N184 Chronic kidney disease, stage 4 (severe): Secondary | ICD-10-CM | POA: Diagnosis present

## 2021-10-25 DIAGNOSIS — I5033 Acute on chronic diastolic (congestive) heart failure: Secondary | ICD-10-CM | POA: Diagnosis present

## 2021-10-25 DIAGNOSIS — E559 Vitamin D deficiency, unspecified: Secondary | ICD-10-CM | POA: Diagnosis present

## 2021-10-25 DIAGNOSIS — E871 Hypo-osmolality and hyponatremia: Secondary | ICD-10-CM | POA: Diagnosis present

## 2021-10-25 DIAGNOSIS — D509 Iron deficiency anemia, unspecified: Secondary | ICD-10-CM | POA: Diagnosis present

## 2021-10-25 DIAGNOSIS — I248 Other forms of acute ischemic heart disease: Secondary | ICD-10-CM | POA: Diagnosis present

## 2021-10-25 LAB — BASIC METABOLIC PANEL
Anion gap: 9 (ref 5–15)
BUN: 84 mg/dL — ABNORMAL HIGH (ref 8–23)
CO2: 29 mmol/L (ref 22–32)
Calcium: 9.2 mg/dL (ref 8.9–10.3)
Chloride: 101 mmol/L (ref 98–111)
Creatinine, Ser: 2.55 mg/dL — ABNORMAL HIGH (ref 0.44–1.00)
GFR, Estimated: 19 mL/min — ABNORMAL LOW (ref >60)
Glucose, Bld: 77 mg/dL (ref 70–99)
Potassium: 4.3 mmol/L (ref 3.5–5.1)
Sodium: 139 mmol/L (ref 135–145)

## 2021-10-25 LAB — CBC
HCT: 29 % — ABNORMAL LOW (ref 36.0–46.0)
Hemoglobin: 8.9 g/dL — ABNORMAL LOW (ref 12.0–15.0)
MCH: 26.3 pg (ref 26.0–34.0)
MCHC: 30.7 g/dL (ref 30.0–36.0)
MCV: 85.8 fL (ref 80.0–100.0)
Platelets: 193 10*3/uL (ref 150–400)
RBC: 3.38 MIL/uL — ABNORMAL LOW (ref 3.87–5.11)
RDW: 20.1 % — ABNORMAL HIGH (ref 11.5–15.5)
WBC: 9.9 10*3/uL (ref 4.0–10.5)
nRBC: 0 % (ref 0.0–0.2)

## 2021-10-25 LAB — GLUCOSE, CAPILLARY
Glucose-Capillary: 75 mg/dL (ref 70–99)
Glucose-Capillary: 84 mg/dL (ref 70–99)
Glucose-Capillary: 142 mg/dL — ABNORMAL HIGH (ref 70–99)

## 2021-10-25 MED ORDER — ADULT MULTIVITAMIN W/MINERALS CH
1.0000 | ORAL_TABLET | Freq: Every day | ORAL | Status: DC
  Administered 2021-10-25 – 2021-10-30 (×6): 1 via ORAL
  Filled 2021-10-25 (×6): qty 1

## 2021-10-25 MED ORDER — HYDRALAZINE HCL 25 MG PO TABS
25.0000 mg | ORAL_TABLET | Freq: Two times a day (BID) | ORAL | Status: DC
  Administered 2021-10-25 – 2021-10-30 (×11): 25 mg via ORAL
  Filled 2021-10-25 (×11): qty 1

## 2021-10-25 NOTE — Progress Notes (Signed)
Heart Failure Nurse Navigator Progress Note    Patient stated she would like to just stay with her Dr and not come to HF TOC. Patient was educated on the sign and symptoms of HF, Daily weights, when to call the doctor or go to the ER. Diet/ fluid restrictions, taking all medications as prescribed and attending all doctor appointments. She stated her daughters take  her to all appointments. Patient verbalized her understanding of the education and stated she was going to call Dr. Johnsie Cancel office and make a appointment.    Earnestine Leys, BSN, Clinical cytogeneticist Only

## 2021-10-25 NOTE — Plan of Care (Signed)
  Problem: Clinical Measurements: Goal: Respiratory complications will improve Outcome: Progressing   Problem: Activity: Goal: Risk for activity intolerance will decrease Outcome: Progressing   Problem: Coping: Goal: Level of anxiety will decrease Outcome: Progressing   

## 2021-10-25 NOTE — Progress Notes (Signed)
  Transition of Care [facility]) Screening Note   Patient Details  Name: [name] Date of Birth: 26-Nov-1945   Transition of Care [facility] CM/SW Contact:    Bethann Berkshire, [place] Phone Number: 10/25/2021, 3:26 PM    Transition of Care Department [facility]) has reviewed patient and no TOC needs have been identified at this time. We will continue to monitor patient advancement through interdisciplinary progression rounds. If new patient transition needs arise, please place a TOC consult.

## 2021-10-25 NOTE — Progress Notes (Signed)
Progress Note  Patient Name: [name] Date of Encounter: 10/25/2021  [facility] Cardiologist: [name], MD   Subjective   No acute events overnight. Slept well. Granddaughter present in room today.  Inpatient Medications    Scheduled Meds:  apixaban  2.5 mg Oral BID   budesonide  3 mg Oral Daily   famotidine  20 mg Oral QHS   FLUoxetine  20 mg Oral Daily   furosemide  80 mg Intravenous BID   hydrALAZINE  25 mg Oral BID   lipase/protease/amylase  36,000 Units Oral TID WC   mirtazapine  7.5 mg Oral QHS   pantoprazole  40 mg Oral Daily   sodium chloride flush  3 mL Intravenous Q12H   Continuous Infusions:  PRN Meds: acetaminophen **OR** acetaminophen, bisacodyl, hydrALAZINE, loperamide, ondansetron **OR** ondansetron (ZOFRAN) IV, polyethylene glycol, traZODone   Vital Signs    Vitals:   10/24/21 1616 10/24/21 1928 10/25/21 0000 10/25/21 0625  BP: (!) 134/59 (!) 154/82 132/62 (!) 131/58  Pulse: 85 88 83 76  Resp: 17 18 18    Temp: 97.6 F (36.4 C) (!) 97.5 F (36.4 C) 97.7 F (36.5 C) 98.2 F (36.8 C)  TempSrc: Oral Oral Oral Oral  SpO2: 98% 99% 100% 94%  Weight:    67.3 kg  Height:        Intake/Output Summary (Last 24 hours) at 10/25/2021 1042 Last data filed at 10/25/2021 0841 Gross per 24 hour  Intake 954 ml  Output 1550 ml  Net -596 ml      10/25/2021    6:25 AM 10/24/2021    4:41 AM 10/23/2021    8:07 PM  Last 3 Weights  Weight (lbs) 148 lb 4.8 oz 150 lb 3.2 oz 150 lb  Weight (kg) 67.268 kg 68.13 kg 68.04 kg      Telemetry    SR - Personally Reviewed  ECG    10/24/21 SR, RBBB - Personally Reviewed  Physical Exam   GEN: No acute distress.   Neck: JVD at clavicle at 90 degrees Cardiac: RRR, no rubs, or gallops. 2/6 systolic murmur Respiratory: Clear to auscultation bilaterally in upper fields, improving at bases GI: Soft, nontender, non-distended  MS: bilateral LE pitting edema, still 2+ but improved from yesterday/less  tense Neuro:  Nonfocal  Psych: Normal affect   Labs    High Sensitivity Troponin:   Recent Labs  Lab 10/19/21 1819 10/19/21 2009 10/23/21 2012 10/24/21 0214  TROPONINIHS 20* 20* 18* 16     Chemistry Recent Labs  Lab 10/19/21 1819 10/20/21 0319 10/20/21 1132 10/23/21 2012 10/24/21 0735 10/25/21 0731  NA 139 140  --  142 143 139  K 3.9 3.3*  --  4.3 4.5 4.3  CL 106 108  --  106 108 101  CO2 20* 21*  --  20* 24 29  GLUCOSE 91 73  --  126* 88 77  BUN 79* 83*  --  83* 82* 84*  CREATININE 3.18* 2.95*  --  3.36* 2.99* 2.55*  CALCIUM 8.3* 7.8*  --  10.0 9.2 9.2  MG  --  1.0* 1.2*  --  1.7  --   PROT 6.8  --   --  7.7  --   --   ALBUMIN 3.6  --   --  3.9  --   --   AST 20  --   --  14*  --   --   ALT 13  --   --  12  --   --   ALKPHOS 66  --   --  96  --   --   BILITOT 0.3  --   --  0.3  --   --   GFRNONAA 15* 16*  --  14* 16* 19*  ANIONGAP 13 11  --  16* 11 9    Lipids No results for input(s): "CHOL", "TRIG", "HDL", "LABVLDL", "LDLCALC", "CHOLHDL" in the last 168 hours.  Hematology Recent Labs  Lab 10/23/21 2012 10/24/21 0735 10/25/21 0731  WBC 14.0* 13.4* 9.9  RBC 3.46* 3.33* 3.38*  HGB 9.3* 8.8* 8.9*  HCT 30.1* 28.8* 29.0*  MCV 87.0 86.5 85.8  MCH 26.9 26.4 26.3  MCHC 30.9 30.6 30.7  RDW 20.8* 20.5* 20.1*  PLT 244 224 193   Thyroid No results for input(s): "TSH", "FREET4" in the last 168 hours.  BNP Recent Labs  Lab 10/19/21 1819 10/23/21 2012  BNP 338.1* 352.1*    DDimer No results for input(s): "DDIMER" in the last 168 hours.   Radiology    ECHOCARDIOGRAM COMPLETE  Result Date: 10/24/2021    ECHOCARDIOGRAM REPORT   Patient Name:   [name] Date of Exam: 10/24/2021 Medical Rec #:  [id number]          Height:       64.0 in Accession #:    [id number]         Weight:       150.2 lb Date of Birth:  [birth year]          BSA:          1.732 m Patient Age:    75 years           BP:           135/61 mmHg Patient Gender: F                  HR:            87 bpm. Exam Location:  Inpatient Procedure: 2D Echo, Cardiac Doppler and Color Doppler Indications:    CHF  History:        Patient has prior history of Echocardiogram examinations, most                 recent 06/30/2020. CHF, CAD; Risk Factors:Diabetes, Hypertension                 and Dyslipidemia.  Sonographer:    Jefferey Pica Referring Phys: [place]  1. Left ventricular ejection fraction, by estimation, is 60 to 65%. The left ventricle has normal function. The left ventricle has no regional wall motion abnormalities. Left ventricular diastolic parameters are indeterminate.  2. Right ventricular systolic function is normal. The right ventricular size is normal. There is moderately elevated pulmonary artery systolic pressure.  3. Left atrial size was mildly dilated.  4. The mitral valve is normal in structure. Mild to moderate mitral valve regurgitation.  5. Tricuspid valve regurgitation is mild to moderate.  6. The aortic valve is tricuspid. Aortic valve regurgitation is not visualized. No aortic stenosis is present.  7. The inferior vena cava is dilated in size with <50% respiratory variability, suggesting right atrial pressure of 15 mmHg. Comparison(s): Prior images reviewed side by side. The left ventricular function is unchanged. There is further increase in estimated right atrial and pulmonary artery pressure. Left ventricular diastolic parameters and mitral insufficiency appear largely unchanged. FINDINGS  Left Ventricle: Left ventricular ejection fraction,  by estimation, is 60 to 65%. The left ventricle has normal function. The left ventricle has no regional wall motion abnormalities. The left ventricular internal cavity size was normal in size. There is  no left ventricular hypertrophy. Left ventricular diastolic parameters are indeterminate. Indeterminate filling pressures. Right Ventricle: The right ventricular size is normal. No increase in right ventricular wall thickness.  Right ventricular systolic function is normal. There is moderately elevated pulmonary artery systolic pressure. The tricuspid regurgitant velocity is 3.06 m/s, and with an assumed right atrial pressure of 15 mmHg, the estimated right ventricular systolic pressure is 68.3 mmHg. Left Atrium: Left atrial size was mildly dilated. Right Atrium: Right atrial size was normal in size. Pericardium: There is no evidence of pericardial effusion. Mitral Valve: The mitral valve is normal in structure. Mild to moderate mitral valve regurgitation. Tricuspid Valve: The tricuspid valve is normal in structure. Tricuspid valve regurgitation is mild to moderate. Aortic Valve: The aortic valve is tricuspid. Aortic valve regurgitation is not visualized. No aortic stenosis is present. Aortic valve peak gradient measures 8.3 mmHg. Pulmonic Valve: The pulmonic valve was normal in structure. Pulmonic valve regurgitation is trivial. Aorta: The aortic root and ascending aorta are structurally normal, with no evidence of dilitation. Venous: The inferior vena cava is dilated in size with less than 50% respiratory variability, suggesting right atrial pressure of 15 mmHg. IAS/Shunts: No atrial level shunt detected by color flow Doppler.  LEFT VENTRICLE PLAX 2D LVIDd:         4.20 cm   Diastology LVIDs:         2.20 cm   LV e' medial:    5.57 cm/s LV PW:         1.10 cm   LV E/e' medial:  16.6 LV IVS:        1.10 cm   LV e' lateral:   7.51 cm/s LVOT diam:     1.90 cm   LV E/e' lateral: 12.3 LV SV:         63 LV SV Index:   37 LVOT Area:     2.84 cm  RIGHT VENTRICLE             IVC RV Basal diam:  2.60 cm     IVC diam: 2.60 cm RV S prime:     13.30 cm/s TAPSE (M-mode): 3.0 cm LEFT ATRIUM             Index        RIGHT ATRIUM           Index LA diam:        4.00 cm 2.31 cm/m   RA Area:     10.70 cm LA Vol (A2C):   59.7 ml 34.46 ml/m  RA Volume:   24.20 ml  13.97 ml/m LA Vol (A4C):   52.1 ml 30.08 ml/m LA Biplane Vol: 58.3 ml 33.66 ml/m  AORTIC  VALVE                 PULMONIC VALVE AV Area (Vmax): 1.85 cm     PV Vmax:       0.66 m/s AV Vmax:        144.00 cm/s  PV Peak grad:  1.8 mmHg AV Peak Grad:   8.3 mmHg LVOT Vmax:      94.20 cm/s LVOT Vmean:     57.600 cm/s LVOT VTI:       0.223 m  AORTA Ao Root diam: 3.00 cm Ao  Asc diam:  3.10 cm MITRAL VALVE               TRICUSPID VALVE MV Area (PHT): 5.55 cm    TR Peak grad:   37.5 mmHg MV Decel Time: 137 msec    TR Vmax:        306.00 cm/s MV E velocity: 92.42 cm/s MV A velocity: 93.53 cm/s  SHUNTS MV E/A ratio:  0.99        Systemic VTI:  0.22 m                            Systemic Diam: 1.90 cm Dani Gobble Croitoru MD Electronically signed by Sanda Klein MD Signature Date/Time: 10/24/2021/3:32:23 PM    Final    DG Chest 2 View  Result Date: 10/23/2021 CLINICAL DATA:  Shortness of breath.  Lower extremity edema. EXAM: CHEST - 2 VIEW COMPARISON:  10/19/2021 FINDINGS: Cardiomegaly. Aortic tortuosity. Coronary artery stent. Pulmonary venous hypertension with mild edema. Very small amount of pleural fluid. No lobar consolidation or collapse. IMPRESSION: Pulmonary venous hypertension with mild edema consistent with fluid overload/congestive heart failure. Electronically Signed   By: Nelson Chimes M.D.   On: 10/23/2021 20:45    Cardiac Studies   ECHO: 06/30/2020  1. Left ventricular ejection fraction, by estimation, is 55 to 60%. The  left ventricle has normal function. The left ventricle has no regional  wall motion abnormalities. There is mild left ventricular hypertrophy.  Left ventricular diastolic parameters  are consistent with Grade I diastolic dysfunction (impaired relaxation).   2. Right ventricular systolic function is normal. The right ventricular  size is normal. There is mildly elevated pulmonary artery systolic  pressure. The estimated right ventricular systolic pressure is 78.6 mmHg.   3. Left atrial size was mildly dilated.   4. The mitral valve is normal in structure. Mild mitral valve   regurgitation. No evidence of mitral stenosis.   5. The aortic valve is tricuspid. Aortic valve regurgitation is not  visualized. No aortic stenosis is present.   6. The inferior vena cava is dilated in size with >50% respiratory  variability, suggesting right atrial pressure of 8 mmHg.    PCI/CARDIAC CATH: 06/30/2004 PCI:  IMPRESSION/PLAN:  Successful drug-eluting stent placement in the proximal  LAD.  The patient will be continued on Plavix for one year.  Aspirin will be continued indefinitely.  She will be ambulated in two hours.  Attention will be given to improved hypertension control. CARDIAC CATH: 06/30/2004  ANGIOGRAPHIC FINDINGS:  1.  The left main coronary artery is free of significant __________ coronary atherosclerosis.  2.  The left anterior descending is a medium caliber vessel with a large      bifurcating proximal diagonal branch and two smaller distal branches.      There is approximately 80% hypodense stenosis in the proximal left      anterior descending followed by 40% vessel stenosis, 30% distal stenosis and 40% apical stenosis. The large proximal bifurcating diagonal branch has a 50% ostial stenosis and 50% proximal stenosis but no major flow limiting disease.  3.  The circumflex coronary artery is a medium to large caliber vessel with four obtuse marginal branches. The first obtuse marginal branch has a 30% stenosis and there are other minor Luminal irregularities within the      circumflex proper including a 20% mid vessel stenosis.  4.  The right coronary artery is a dominant vessel that  is medium in      caliber. There is 20% proximal vessel stenosis and a 50% mid vessel      stenosis.  5.  Selective renal angiography was performed showing patent bilateral renal arteries without significant flow limiting obstruction.  6.  Left ventriculography is performed in the RAO projection revealing an      ejection fraction greater than 55% in the setting of ventricular  ectopy      with 1+ mitral regurgitation. There also looks to be some degree of      apical hypokinesis.    DIAGNOSES:  1.  Single vessel obstructive coronary artery disease of the left anterior      descending in the proximal vessel presumably at or near the previous      arthrectomy site. The proximal diagonal branch has disease that is      nonobstructive. No other major flow limiting disease is noted in the      remaining coronaries.  2.  Patent bilateral renal arteries.  3.  __________ ejection fraction greater than 55% with apical hypokinesis      and 1+ mitral regurgitation in the setting of ectopy. Left ventricular      diastolic pressure is 16 mmHg.  4.  Systemic hypertension.  Patient Profile     76 y.o. female with complex PMH, including chronic kidney disease stage 4 (follows with [facility]/Dr. Hollie Salk), chronic diastolic heart failure, CAD with prior PCI, statin intolerance who presented with acute on chronic diastolic heart failure.  Assessment & Plan    Acute on chronic diastolic heart failure, with stage 4 CKD -now on 80 mg IV lasix BID. Responding to this with stable renal function -admission weight 68.1 kg, weight today 67.3 kg. Net negative ~1 L -Cr improved slightly, from 2.99 to 2.55 -If she does not diurese well or her Cr increases, would recommend getting assistance from [facility] as they follow her as an outpatient. -avoid hypotension with diuresis. Only on hydralazine at this time. Monitor BP closely   History of renal vein thrombosis and DVT, on long term apixaban: per primary team  CAD s/p prior PCI Elevated troponin (peak 20) Statin adverse reaction -hsTn most consistent with demand, given heart failure and CKD -asymptomatic. Felt poorly on beta blocker, no indication to restart at this time -no aspirin as she is on apixaban -was on statin previously, stopped earlier this year for concern for statin induced transaminitis/myalgia (see  admission 03/2021, was on rosuvastatin 40 mg)  For questions or updates, please contact [facility] Please consult [email or web address] for contact info under     Signed, Buford Dresser, MD  10/25/2021, 10:42 AM

## 2021-10-25 NOTE — Progress Notes (Addendum)
PROGRESS NOTE    [name]  RCB:[id number] DOB: May 08, 1945 DOA: 10/23/2021 PCP: Seward Carol, MD   76/F with chronic diastolic CHF, CKD 4, CAD s/p stent; DVT; CVA: DM; HTN; and chronic gastritis presenting with SOB. She was last admitted with similar presentation from 8/3-4 and improved with Lasix.  Discharged on PRN diuretics, presented to the ED 8/8 with dyspnea on exertion, swelling. -CXR shows pulm edema. Creat 3.3   Subjective: Feels better, breathing is improving  Assessment and Plan:  Acute on chronic diastolic CHF Moderate PAH -repeat admission, for same -Echo with EF 60-65%, indeterminate diastolic parameters, moderate PAH -CXR consistent with mild pulmonary edema -Diuresing with IV Lasix, she is 1.5 L negative, creatinine down to 2.5 today -Continue Lasix 80 Mg twice daily today -GDMT limited by CKD 4   AKI on stage 4 CKD -Baseline creatinine around 2.5-3,, Creatinine was 3.3 on admission -Likely cardiorenal, improving, monitor, avoid hypotension -Followed by Dr. Hollie Salk of [place] [facility]   HTN -Continue PO hydralazine will decrease dose   Normocytic anemia -Likely from CKD, check anemia panel in a.m.   DM -Last A1c was 5.5 -Will discontinue insulin, CBGs less than 100   CAD -No apparent concern for ACS currently -Negative troponin   Chronic gastritis -Continue home budesonide, Creon, Protonix, Pepcid   Insomnia -Continue Remeron    H/o DVT, renal vein thrombosis -Continue Eliquis     Advance Care Planning:   Full code   Consults: Cardiology; PT/OT; nutrition; TOC team; CHF navigator   DVT Prophylaxis: Eliquis Family Communication: No family at bedside, left msg for daughter [name] Disposition Plan: Home likely 48 hours if stable  Consultants:    Procedures:   Antimicrobials:    Objective: Vitals:   10/24/21 1616 10/24/21 1928 10/25/21 0000 10/25/21 0625  BP: (!) 134/59 (!) 154/82 132/62 (!) 131/58  Pulse: 85 88 83 76  Resp:  17 18 18    Temp: 97.6 F (36.4 C) (!) 97.5 F (36.4 C) 97.7 F (36.5 C) 98.2 F (36.8 C)  TempSrc: Oral Oral Oral Oral  SpO2: 98% 99% 100% 94%  Weight:    67.3 kg  Height:        Intake/Output Summary (Last 24 hours) at 10/25/2021 1026 Last data filed at 10/25/2021 0841 Gross per 24 hour  Intake 954 ml  Output 1950 ml  Net -996 ml   Filed Weights   10/23/21 2007 10/24/21 0441 10/25/21 0625  Weight: 68 kg 68.1 kg 67.3 kg    Examination:  General exam: Pleasant chronically ill female sitting up in the chair, AAOx3, no distress HEENT: Positive JVD CVS: S1-S2, regular rhythm Lungs: Few basilar rales Abdomen: Soft, obese, nontender, bowel sounds present Extremities: 1+ edema, chronic skin changes and hyperpigmentatio Psychiatry:  Mood & affect appropriate.     Data Reviewed:   CBC: Recent Labs  Lab 10/19/21 1819 10/20/21 0319 10/23/21 2012 10/24/21 0735 10/25/21 0731  WBC 11.1* 7.5 14.0* 13.4* 9.9  NEUTROABS 8.9*  --  11.2*  --   --   HGB 9.3* 8.7* 9.3* 8.8* 8.9*  HCT 29.7* 26.8* 30.1* 28.8* 29.0*  MCV 84.6 84.0 87.0 86.5 85.8  PLT 202 168 244 224 803   Basic Metabolic Panel: Recent Labs  Lab 10/19/21 1819 10/20/21 0319 10/20/21 1132 10/23/21 2012 10/24/21 0735 10/25/21 0731  NA 139 140  --  142 143 139  K 3.9 3.3*  --  4.3 4.5 4.3  CL 106 108  --  106 108 101  CO2 20* 21*  --  20* 24 29  GLUCOSE 91 73  --  126* 88 77  BUN 79* 83*  --  83* 82* 84*  CREATININE 3.18* 2.95*  --  3.36* 2.99* 2.55*  CALCIUM 8.3* 7.8*  --  10.0 9.2 9.2  MG  --  1.0* 1.2*  --  1.7  --    GFR: Estimated Creatinine Clearance: 18.2 mL/min (A) (by C-G formula based on SCr of 2.55 mg/dL (H)). Liver Function Tests: Recent Labs  Lab 10/19/21 1819 10/23/21 2012  AST 20 14*  ALT 13 12  ALKPHOS 66 96  BILITOT 0.3 0.3  PROT 6.8 7.7  ALBUMIN 3.6 3.9   No results for input(s): "LIPASE", "AMYLASE" in the last 168 hours. No results for input(s): "AMMONIA" in the last 168  hours. Coagulation Profile: No results for input(s): "INR", "PROTIME" in the last 168 hours. Cardiac Enzymes: No results for input(s): "CKTOTAL", "CKMB", "CKMBINDEX", "TROPONINI" in the last 168 hours. BNP (last 3 results) No results for input(s): "PROBNP" in the last 8760 hours. HbA1C: No results for input(s): "HGBA1C" in the last 72 hours. CBG: Recent Labs  Lab 10/20/21 1656 10/24/21 1048 10/24/21 1613 10/24/21 2111 10/25/21 0622  GLUCAP 123* 89 116* 106* 75   Lipid Profile: No results for input(s): "CHOL", "HDL", "LDLCALC", "TRIG", "CHOLHDL", "LDLDIRECT" in the last 72 hours. Thyroid Function Tests: No results for input(s): "TSH", "T4TOTAL", "FREET4", "T3FREE", "THYROIDAB" in the last 72 hours. Anemia Panel: No results for input(s): "VITAMINB12", "FOLATE", "FERRITIN", "TIBC", "IRON", "RETICCTPCT" in the last 72 hours. Urine analysis:    Component Value Date/Time   COLORURINE YELLOW 04/16/2021 1947   APPEARANCEUR CLOUDY (A) 04/16/2021 1947   APPEARANCEUR Clear 08/22/2018 1620   LABSPEC 1.015 04/16/2021 1947   LABSPEC 1.020 03/03/2014 1341   PHURINE 5.5 04/16/2021 1947   GLUCOSEU NEGATIVE 04/16/2021 1947   GLUCOSEU Negative 03/03/2014 1341   HGBUR NEGATIVE 04/16/2021 [place] 04/16/2021 1947   BILIRUBINUR Negative 08/22/2018 1620   BILIRUBINUR Negative 03/03/2014 1341   KETONESUR NEGATIVE 04/16/2021 1947   PROTEINUR 30 (A) 04/16/2021 1947   UROBILINOGEN 0.2 03/03/2014 1341   NITRITE NEGATIVE 04/16/2021 1947   LEUKOCYTESUR TRACE (A) 04/16/2021 1947   LEUKOCYTESUR Negative 03/03/2014 1341   Sepsis Labs: @LABRCNTIP (procalcitonin:4,lacticidven:4)  )No results found for this or any previous visit (from the past 240 hour(s)).   Radiology Studies: ECHOCARDIOGRAM COMPLETE  Result Date: 10/24/2021    ECHOCARDIOGRAM REPORT   Patient Name:   [name] Date of Exam: 10/24/2021 Medical Rec #:  [id number]          Height:       64.0 in Accession #:     [id number]         Weight:       150.2 lb Date of Birth:  [birth year]          BSA:          1.732 m Patient Age:    75 years           BP:           135/61 mmHg Patient Gender: F                  HR:           87 bpm. Exam Location:  Inpatient Procedure: 2D Echo, Cardiac Doppler and Color Doppler Indications:    CHF  History:        Patient has  prior history of Echocardiogram examinations, most                 recent 06/30/2020. CHF, CAD; Risk Factors:Diabetes, Hypertension                 and Dyslipidemia.  Sonographer:    Jefferey Pica Referring Phys: [place]  1. Left ventricular ejection fraction, by estimation, is 60 to 65%. The left ventricle has normal function. The left ventricle has no regional wall motion abnormalities. Left ventricular diastolic parameters are indeterminate.  2. Right ventricular systolic function is normal. The right ventricular size is normal. There is moderately elevated pulmonary artery systolic pressure.  3. Left atrial size was mildly dilated.  4. The mitral valve is normal in structure. Mild to moderate mitral valve regurgitation.  5. Tricuspid valve regurgitation is mild to moderate.  6. The aortic valve is tricuspid. Aortic valve regurgitation is not visualized. No aortic stenosis is present.  7. The inferior vena cava is dilated in size with <50% respiratory variability, suggesting right atrial pressure of 15 mmHg. Comparison(s): Prior images reviewed side by side. The left ventricular function is unchanged. There is further increase in estimated right atrial and pulmonary artery pressure. Left ventricular diastolic parameters and mitral insufficiency appear largely unchanged. FINDINGS  Left Ventricle: Left ventricular ejection fraction, by estimation, is 60 to 65%. The left ventricle has normal function. The left ventricle has no regional wall motion abnormalities. The left ventricular internal cavity size was normal in size. There is  no left  ventricular hypertrophy. Left ventricular diastolic parameters are indeterminate. Indeterminate filling pressures. Right Ventricle: The right ventricular size is normal. No increase in right ventricular wall thickness. Right ventricular systolic function is normal. There is moderately elevated pulmonary artery systolic pressure. The tricuspid regurgitant velocity is 3.06 m/s, and with an assumed right atrial pressure of 15 mmHg, the estimated right ventricular systolic pressure is 35.7 mmHg. Left Atrium: Left atrial size was mildly dilated. Right Atrium: Right atrial size was normal in size. Pericardium: There is no evidence of pericardial effusion. Mitral Valve: The mitral valve is normal in structure. Mild to moderate mitral valve regurgitation. Tricuspid Valve: The tricuspid valve is normal in structure. Tricuspid valve regurgitation is mild to moderate. Aortic Valve: The aortic valve is tricuspid. Aortic valve regurgitation is not visualized. No aortic stenosis is present. Aortic valve peak gradient measures 8.3 mmHg. Pulmonic Valve: The pulmonic valve was normal in structure. Pulmonic valve regurgitation is trivial. Aorta: The aortic root and ascending aorta are structurally normal, with no evidence of dilitation. Venous: The inferior vena cava is dilated in size with less than 50% respiratory variability, suggesting right atrial pressure of 15 mmHg. IAS/Shunts: No atrial level shunt detected by color flow Doppler.  LEFT VENTRICLE PLAX 2D LVIDd:         4.20 cm   Diastology LVIDs:         2.20 cm   LV e' medial:    5.57 cm/s LV PW:         1.10 cm   LV E/e' medial:  16.6 LV IVS:        1.10 cm   LV e' lateral:   7.51 cm/s LVOT diam:     1.90 cm   LV E/e' lateral: 12.3 LV SV:         63 LV SV Index:   37 LVOT Area:     2.84 cm  RIGHT VENTRICLE  IVC RV Basal diam:  2.60 cm     IVC diam: 2.60 cm RV S prime:     13.30 cm/s TAPSE (M-mode): 3.0 cm LEFT ATRIUM             Index        RIGHT ATRIUM            Index LA diam:        4.00 cm 2.31 cm/m   RA Area:     10.70 cm LA Vol (A2C):   59.7 ml 34.46 ml/m  RA Volume:   24.20 ml  13.97 ml/m LA Vol (A4C):   52.1 ml 30.08 ml/m LA Biplane Vol: 58.3 ml 33.66 ml/m  AORTIC VALVE                 PULMONIC VALVE AV Area (Vmax): 1.85 cm     PV Vmax:       0.66 m/s AV Vmax:        144.00 cm/s  PV Peak grad:  1.8 mmHg AV Peak Grad:   8.3 mmHg LVOT Vmax:      94.20 cm/s LVOT Vmean:     57.600 cm/s LVOT VTI:       0.223 m  AORTA Ao Root diam: 3.00 cm Ao Asc diam:  3.10 cm MITRAL VALVE               TRICUSPID VALVE MV Area (PHT): 5.55 cm    TR Peak grad:   37.5 mmHg MV Decel Time: 137 msec    TR Vmax:        306.00 cm/s MV E velocity: 92.42 cm/s MV A velocity: 93.53 cm/s  SHUNTS MV E/A ratio:  0.99        Systemic VTI:  0.22 m                            Systemic Diam: 1.90 cm Dani Gobble Croitoru MD Electronically signed by Sanda Klein MD Signature Date/Time: 10/24/2021/3:32:23 PM    Final    DG Chest 2 View  Result Date: 10/23/2021 CLINICAL DATA:  Shortness of breath.  Lower extremity edema. EXAM: CHEST - 2 VIEW COMPARISON:  10/19/2021 FINDINGS: Cardiomegaly. Aortic tortuosity. Coronary artery stent. Pulmonary venous hypertension with mild edema. Very small amount of pleural fluid. No lobar consolidation or collapse. IMPRESSION: Pulmonary venous hypertension with mild edema consistent with fluid overload/congestive heart failure. Electronically Signed   By: Nelson Chimes M.D.   On: 10/23/2021 20:45     Scheduled Meds:  apixaban  2.5 mg Oral BID   budesonide  3 mg Oral Daily   famotidine  20 mg Oral QHS   FLUoxetine  20 mg Oral Daily   furosemide  80 mg Intravenous BID   hydrALAZINE  25 mg Oral BID   insulin aspart  0-9 Units Subcutaneous TID WC   lipase/protease/amylase  36,000 Units Oral TID WC   mirtazapine  7.5 mg Oral QHS   pantoprazole  40 mg Oral Daily   sodium chloride flush  3 mL Intravenous Q12H   Continuous Infusions:   LOS: 0 days    Time spent:  20mn  PDomenic Polite MD [facility]   10/25/2021, 10:26 AM

## 2021-10-25 NOTE — Progress Notes (Signed)
OT Cancellation Note  Patient Details Name: Tavi [name] Tocco MRN: [id number] DOB: 08-May-1945   Cancelled Treatment:    Reason Eval/Treat Not Completed: OT screened, no needs identified, will sign off Patient seen by PT yesterday with patient at independent level for ambulation and independent in ADLs, therefore no acute OT needs are required at this time. All education with regard to energy conservation completed by PT during evaluation. Please re-consult if further issues arise.   Corinne Ports E. Makela Niehoff, OTR/L Acute Rehabilitation Services [phone]   Ascencion Dike 10/25/2021, 7:33 AM

## 2021-10-25 NOTE — Consult Note (Signed)
   [facility] Inpatient Consult   10/25/2021  [name] 04/03/1945 [id number]  [facility] [ACO] Patient: [name] Medical   Primary Care Provider:  Seward Carol, MD with [name] at [place] [facility], is an embedded provider with a Chronic Care Management team and program, and is listed for the transition of care follow up and appointments.  Patient was screened for  high risk score for unplanned readmission risk with readmission in less than 7 days noted.  Embedded provider practice service needs were reviewed for care coordination.   Plan: If additional care needs are note the Embedded Care Management can be referred. Continue to follow.  Please contact for further questions,  Natividad Brood, RN BSN [facility] Liaison  [phone] business mobile phone Toll free office [phone]  Fax number: [phone] [name]

## 2021-10-25 NOTE — Plan of Care (Signed)
  Problem: Activity: Goal: Risk for activity intolerance will decrease Outcome: Progressing   Problem: Elimination: Goal: Will not experience complications related to urinary retention Outcome: Progressing

## 2021-10-26 ENCOUNTER — Inpatient Hospital Stay: Payer: Medicare Other

## 2021-10-26 ENCOUNTER — Inpatient Hospital Stay: Payer: Medicare Other | Admitting: Hematology

## 2021-10-26 DIAGNOSIS — K2930 Chronic superficial gastritis without bleeding: Secondary | ICD-10-CM

## 2021-10-26 DIAGNOSIS — N184 Chronic kidney disease, stage 4 (severe): Secondary | ICD-10-CM | POA: Diagnosis not present

## 2021-10-26 DIAGNOSIS — E1169 Type 2 diabetes mellitus with other specified complication: Secondary | ICD-10-CM

## 2021-10-26 DIAGNOSIS — I823 Embolism and thrombosis of renal vein: Secondary | ICD-10-CM

## 2021-10-26 DIAGNOSIS — I5033 Acute on chronic diastolic (congestive) heart failure: Secondary | ICD-10-CM | POA: Diagnosis not present

## 2021-10-26 DIAGNOSIS — I2510 Atherosclerotic heart disease of native coronary artery without angina pectoris: Secondary | ICD-10-CM | POA: Diagnosis not present

## 2021-10-26 DIAGNOSIS — E1159 Type 2 diabetes mellitus with other circulatory complications: Secondary | ICD-10-CM | POA: Diagnosis not present

## 2021-10-26 DIAGNOSIS — E785 Hyperlipidemia, unspecified: Secondary | ICD-10-CM

## 2021-10-26 LAB — CBC
HCT: 25.7 % — ABNORMAL LOW (ref 36.0–46.0)
Hemoglobin: 8.2 g/dL — ABNORMAL LOW (ref 12.0–15.0)
MCH: 27.2 pg (ref 26.0–34.0)
MCHC: 31.9 g/dL (ref 30.0–36.0)
MCV: 85.1 fL (ref 80.0–100.0)
Platelets: 202 10*3/uL (ref 150–400)
RBC: 3.02 MIL/uL — ABNORMAL LOW (ref 3.87–5.11)
RDW: 19.9 % — ABNORMAL HIGH (ref 11.5–15.5)
WBC: 8.6 10*3/uL (ref 4.0–10.5)
nRBC: 0 % (ref 0.0–0.2)

## 2021-10-26 LAB — BASIC METABOLIC PANEL
Anion gap: 10 (ref 5–15)
BUN: 89 mg/dL — ABNORMAL HIGH (ref 8–23)
CO2: 26 mmol/L (ref 22–32)
Calcium: 8.8 mg/dL — ABNORMAL LOW (ref 8.9–10.3)
Chloride: 103 mmol/L (ref 98–111)
Creatinine, Ser: 2.99 mg/dL — ABNORMAL HIGH (ref 0.44–1.00)
GFR, Estimated: 16 mL/min — ABNORMAL LOW (ref >60)
Glucose, Bld: 94 mg/dL (ref 70–99)
Potassium: 3.3 mmol/L — ABNORMAL LOW (ref 3.5–5.1)
Sodium: 139 mmol/L (ref 135–145)

## 2021-10-26 LAB — GLUCOSE, CAPILLARY
Glucose-Capillary: 87 mg/dL (ref 70–99)
Glucose-Capillary: 110 mg/dL — ABNORMAL HIGH (ref 70–99)
Glucose-Capillary: 119 mg/dL — ABNORMAL HIGH (ref 70–99)
Glucose-Capillary: 161 mg/dL — ABNORMAL HIGH (ref 70–99)

## 2021-10-26 LAB — VITAMIN B12: Vitamin B-12: 401 pg/mL (ref 180–914)

## 2021-10-26 LAB — RETICULOCYTES
Immature Retic Fract: 9.2 % (ref 2.3–15.9)
RBC.: 3.02 MIL/uL — ABNORMAL LOW (ref 3.87–5.11)
Retic Count, Absolute: 41.1 10*3/uL (ref 19.0–186.0)
Retic Ct Pct: 1.4 % (ref 0.4–3.1)

## 2021-10-26 LAB — FOLATE: Folate: 10.2 ng/mL (ref >5.9)

## 2021-10-26 LAB — IRON AND TIBC
Iron: 34 ug/dL (ref 28–170)
Saturation Ratios: 11 % (ref 10.4–31.8)
TIBC: 308 ug/dL (ref 250–450)
UIBC: 274 ug/dL

## 2021-10-26 LAB — FERRITIN: Ferritin: 26 ng/mL (ref 11–307)

## 2021-10-26 MED ORDER — MAGNESIUM SULFATE 2 GM/50ML IV SOLN
2.0000 g | Freq: Once | INTRAVENOUS | Status: AC
  Administered 2021-10-26: 2 g via INTRAVENOUS
  Filled 2021-10-26: qty 50

## 2021-10-26 MED ORDER — POTASSIUM CHLORIDE CRYS ER 20 MEQ PO TBCR
40.0000 meq | EXTENDED_RELEASE_TABLET | Freq: Once | ORAL | Status: AC
  Administered 2021-10-26: 40 meq via ORAL
  Filled 2021-10-26: qty 2

## 2021-10-26 MED ORDER — METOLAZONE 2.5 MG PO TABS
2.5000 mg | ORAL_TABLET | Freq: Once | ORAL | Status: AC
  Administered 2021-10-26: 2.5 mg via ORAL
  Filled 2021-10-26: qty 1

## 2021-10-26 NOTE — Progress Notes (Signed)
Mobility Specialist Progress Note:   10/26/21 1435  Mobility  Activity Ambulated with assistance in hallway  Level of Assistance Standby assist, set-up cues, supervision of patient - no hands on  Assistive Device None  Distance Ambulated (ft) 400 ft  Activity Response Tolerated well  $Mobility charge 1 Mobility   Second walk of the [name] per pt request. No complaints of pain. Left in chair in room with all needs met.   [facility] Judith Demps Mobility Specialist

## 2021-10-26 NOTE — Assessment & Plan Note (Signed)
Continue anticoagulation with apixaban.

## 2021-10-26 NOTE — Assessment & Plan Note (Addendum)
Uncontrolled hypertension,  Continue blood pressure control with amlodipine and hydralazine. No RAS inhibition due to reduced GFR.

## 2021-10-26 NOTE — Progress Notes (Signed)
Progress Note   Patient: [name] TIR:[id number] DOB: January 31, 1946 DOA: 10/23/2021     1 DOS: the patient was seen and examined on 10/26/2021   Brief hospital course: [name] was admitted to the hospital with the working diagnosis of decompensated heart failure.   76 yo female with the past medical history of CKD stage 4, diastolic heart failure, coronary artery disease, DVT, CVA, hypertension, T2DM, and chronic gastritis who presented with dyspnea. Recent hospitalization 08/3 to 08/4 for decompensated heart failure. At home patient had recurrent dyspnea and lower extremity edema, 911 was called and patient has brought back to the hospital. On her initial physical examination her blood pressure was 142/90, HR 93, RR 14 and 02 saturation 98%, lungs with bibasilar rales but not wheezing, heart with S1 and S2 present and rhythmic, abdomen not distended, positive lower extremity edema ++.   Na 142, K 4,3 CL 106 bicarbonate 20, glucose 126, bun 83 cr 3,36  BNP 352  High sensitive troponin 18 and 16  Wbc 14, hgb 9,3 plt 244   Chest radiograph with bilateral hilar vascular congestion, fluid in the right fissure, and cephalization of the vasculature.   EKG 91 bpm, normal axis, right bundle branch block, sinus rhythm with no significant ST segment or T wave changes.   Patient was placed on furosemide for diuresis.   Assessment and Plan: * Acute on chronic heart failure with preserved ejection fraction (HFpEF) (HCC) Echocardiogram with preserved LV systolic function 60 to 67%, RV with preserved RV systolic function. Mild to moderate MR, mild to moderate TR.   Elevated troponin due to heart failure exacerbation.  Demand ischemia.   Patient continue with edema at her lower extremities.  Documented urine output 200 cc.  Systolic blood pressure 619 to 144 mmHg.   Plan to continue diuresis with furosemide 80 mg q12 hrs After load reduction with hydralazine. Not able to use RAS inhibition  due to low GFR.  Plan to add one dose of metolazone today.         CKD (chronic kidney disease), stage IV (HCC) Hypokalemia. Hypomagnesemia   Patient continue edematous. Renal function with serum cr at 2,99 with K at 3,3 and serum bicarbonate at 26.   Plan to add 40 meq Kcl and continue diuresis with furosemide.  Add one dose of metolazone.   Coronary artery disease involving native coronary artery of native heart No clinical signs of acute coronary syndrome.   Hypertension associated with diabetes ([place]) Continue close blood pressure monitoring, continue diuresis.   Type 2 diabetes mellitus with hyperlipidemia (HCC) Fasting glucose this am 94. Plan to continue insulin sliding scale for glucose cover and monitoring.   Chronic gastritis Continue with PPI  Renal vein thrombosis (HCC) Continue anticoagulation with apixaban.         Subjective: Patient continue to have edema at her lower extremities, dyspnea has improved.   Physical Exam: Vitals:   10/25/21 1958 10/26/21 0623 10/26/21 0828 10/26/21 1108  BP: (!) 129/58 (!) 144/63 136/64   Pulse: 87 75 78   Resp: 18 18 18    Temp: 98.6 F (37 C) 97.9 F (36.6 C) 97.9 F (36.6 C)   TempSrc: Oral Oral Oral   SpO2: 97% 98% 99%   Weight:  64.6 kg  62.4 kg  Height:       Neurology awake and alert ENT with mild pallor Cardiovascular with S1 and S2 present and rhythmic, no gallops, positive murmur at the apex  Respiratory  with no rales or wheezing Abdomen not distended Positive bilateral lower extremity edema +++ pitting bilaterally  Data Reviewed:    Family Communication: no family at the bedside   Disposition: Status is: Inpatient Remains inpatient appropriate because: volume overload and renal failure   Planned Discharge Destination: Home     Author: Tawni Millers, MD 10/26/2021 2:25 PM  For on call review [email or web address].

## 2021-10-26 NOTE — Assessment & Plan Note (Addendum)
Hypokalemia. Hypomagnesemia, hyponatremia.  Edema slowly improving, but not back to baseline.  Renal function with serum cr at 2,80 with K at 3,7 and serum bicarbonate at 24, Na is 134.    Plan to continue diuresis with oral furosemide and metolazone.  Add unna boots. [place] lower extremities negative for DVT.  US abdomen with bilateral simple renal cysts, with no hydronephrosis.  Follow up renal function in am.  Check urine analysis.  Anemia of chronic renal disease.

## 2021-10-26 NOTE — Assessment & Plan Note (Addendum)
Her glucose remained stable during her hospitalization.

## 2021-10-26 NOTE — Assessment & Plan Note (Signed)
No clinical signs of acute coronary syndrome.

## 2021-10-26 NOTE — Hospital Course (Addendum)
[name] was admitted to the hospital with the working diagnosis of decompensated heart failure.   76 yo female with the past medical history of CKD stage 4, diastolic heart failure, coronary artery disease, DVT, CVA, hypertension, T2DM, and chronic gastritis who presented with dyspnea. Recent hospitalization 08/3 to 08/4 for decompensated heart failure. At home patient had recurrent dyspnea and lower extremity edema, 911 was called and patient has brought back to the hospital. On her initial physical examination her blood pressure was 142/90, HR 93, RR 14 and 02 saturation 98%, lungs with bibasilar rales but not wheezing, heart with S1 and S2 present and rhythmic, abdomen not distended, positive lower extremity edema ++.   Na 142, K 4,3 CL 106 bicarbonate 20, glucose 126, bun 83 cr 3,36  BNP 352  High sensitive troponin 18 and 16  Wbc 14, hgb 9,3 plt 244   Chest radiograph with bilateral hilar vascular congestion, fluid in the right fissure, and cephalization of the vasculature.   EKG 91 bpm, normal axis, right bundle branch block, sinus rhythm with no significant ST segment or T wave changes.   Patient was placed on furosemide for diuresis.  Added intermittent metolazone.  Slow diuresis due to low GFR.

## 2021-10-26 NOTE — Assessment & Plan Note (Addendum)
Echocardiogram with preserved LV systolic function 60 to 11%, RV with preserved RV systolic function. Mild to moderate MR, mild to moderate TR.   Elevated troponin due to heart failure exacerbation.  Demand ischemia.   Patient was placed on IV furosemide and intermittent metolazone for diuresis, negative fluid balance was achieved, -5.936 with improvement in her symptoms.   Continue with hydralazine and amlodipine for better blood pressure control.  Not able to use RAS inhibition due to low GFR.   At home she is not able to cook anymore and has been eating snacks, likely high salt content.  Patient will follow up as outpatient.

## 2021-10-26 NOTE — Assessment & Plan Note (Signed)
Continue with PPI

## 2021-10-26 NOTE — Progress Notes (Signed)
Progress Note  Patient Name: [name] Date of Encounter: 10/26/2021  [facility] Cardiologist: [name], MD   Subjective   No acute events overnight. Still has some LE edema.  Inpatient Medications    Scheduled Meds:  apixaban  2.5 mg Oral BID   budesonide  3 mg Oral Daily   famotidine  20 mg Oral QHS   FLUoxetine  20 mg Oral Daily   furosemide  80 mg Intravenous BID   hydrALAZINE  25 mg Oral BID   lipase/protease/amylase  36,000 Units Oral TID WC   mirtazapine  7.5 mg Oral QHS   multivitamin with minerals  1 tablet Oral Daily   pantoprazole  40 mg Oral Daily   potassium chloride  40 mEq Oral Once   sodium chloride flush  3 mL Intravenous Q12H   Continuous Infusions:  PRN Meds: acetaminophen **OR** acetaminophen, bisacodyl, hydrALAZINE, loperamide, ondansetron **OR** ondansetron (ZOFRAN) IV, polyethylene glycol, traZODone   Vital Signs    Vitals:   10/25/21 1958 10/26/21 0623 10/26/21 0828 10/26/21 1108  BP: (!) 129/58 (!) 144/63 136/64   Pulse: 87 75 78   Resp: 18 18 18    Temp: 98.6 F (37 C) 97.9 F (36.6 C) 97.9 F (36.6 C)   TempSrc: Oral Oral Oral   SpO2: 97% 98% 99%   Weight:  64.6 kg  62.4 kg  Height:        Intake/Output Summary (Last 24 hours) at 10/26/2021 1156 Last data filed at 10/26/2021 0946 Gross per 24 hour  Intake 960 ml  Output 800 ml  Net 160 ml      10/26/2021   11:08 AM 10/26/2021    6:23 AM 10/25/2021    6:25 AM  Last 3 Weights  Weight (lbs) 137 lb 8 oz 142 lb 6.7 oz 148 lb 4.8 oz  Weight (kg) 62.37 kg 64.6 kg 67.268 kg      Telemetry    SR - Personally Reviewed  ECG    10/24/21 SR, RBBB - Personally Reviewed  Physical Exam   GEN: Well nourished, well developed in no acute distress NECK: No JVD CARDIAC: regular rhythm, normal S1 and S2, no rubs or gallops. 2/6 systolic murmur. VASCULAR: Radial pulses 2+ bilaterally.  RESPIRATORY:  Clear to auscultation without rales, wheezing or rhonchi  ABDOMEN: Soft,  non-tender, non-distended MUSCULOSKELETAL:  Moves all 4 limbs independently SKIN: Warm and dry, bilateral 2+ pitting LE edema, slightly less firm than yesterday NEUROLOGIC:  No focal neuro deficits noted. PSYCHIATRIC:  Normal affect    Labs    High Sensitivity Troponin:   Recent Labs  Lab 10/19/21 1819 10/19/21 2009 10/23/21 2012 10/24/21 0214  TROPONINIHS 20* 20* 18* 16     Chemistry Recent Labs  Lab 10/19/21 1819 10/20/21 0319 10/20/21 1132 10/23/21 2012 10/24/21 0735 10/25/21 0731 10/26/21 0442  NA 139 140  --  142 143 139 139  K 3.9 3.3*  --  4.3 4.5 4.3 3.3*  CL 106 108  --  106 108 101 103  CO2 20* 21*  --  20* 24 29 26   GLUCOSE 91 73  --  126* 88 77 94  BUN 79* 83*  --  83* 82* 84* 89*  CREATININE 3.18* 2.95*  --  3.36* 2.99* 2.55* 2.99*  CALCIUM 8.3* 7.8*  --  10.0 9.2 9.2 8.8*  MG  --  1.0* 1.2*  --  1.7  --   --   PROT 6.8  --   --  7.7  --   --   --   ALBUMIN 3.6  --   --  3.9  --   --   --   AST 20  --   --  14*  --   --   --   ALT 13  --   --  12  --   --   --   ALKPHOS 66  --   --  96  --   --   --   BILITOT 0.3  --   --  0.3  --   --   --   GFRNONAA 15* 16*  --  14* 16* 19* 16*  ANIONGAP 13 11  --  16* 11 9 10     Lipids No results for input(s): "CHOL", "TRIG", "HDL", "LABVLDL", "LDLCALC", "CHOLHDL" in the last 168 hours.  Hematology Recent Labs  Lab 10/24/21 0735 10/25/21 0731 10/26/21 0442  WBC 13.4* 9.9 8.6  RBC 3.33* 3.38* 3.02*  3.02*  HGB 8.8* 8.9* 8.2*  HCT 28.8* 29.0* 25.7*  MCV 86.5 85.8 85.1  MCH 26.4 26.3 27.2  MCHC 30.6 30.7 31.9  RDW 20.5* 20.1* 19.9*  PLT 224 193 202   Thyroid No results for input(s): "TSH", "FREET4" in the last 168 hours.  BNP Recent Labs  Lab 10/19/21 1819 10/23/21 2012  BNP 338.1* 352.1*    DDimer No results for input(s): "DDIMER" in the last 168 hours.   Radiology    ECHOCARDIOGRAM COMPLETE  Result Date: 10/24/2021    ECHOCARDIOGRAM REPORT   Patient Name:   Tamiko [name] Jimenez Date of Exam:  10/24/2021 Medical Rec #:  [id number]          Height:       64.0 in Accession #:    [id number]         Weight:       150.2 lb Date of Birth:  12-09-1945          BSA:          1.732 m Patient Age:    89 years           BP:           135/61 mmHg Patient Gender: F                  HR:           87 bpm. Exam Location:  Inpatient Procedure: 2D Echo, Cardiac Doppler and Color Doppler Indications:    CHF  History:        Patient has prior history of Echocardiogram examinations, most                 recent 06/30/2020. CHF, CAD; Risk Factors:Diabetes, Hypertension                 and Dyslipidemia.  Sonographer:    Jefferey Pica Referring Phys: [place]  1. Left ventricular ejection fraction, by estimation, is 60 to 65%. The left ventricle has normal function. The left ventricle has no regional wall motion abnormalities. Left ventricular diastolic parameters are indeterminate.  2. Right ventricular systolic function is normal. The right ventricular size is normal. There is moderately elevated pulmonary artery systolic pressure.  3. Left atrial size was mildly dilated.  4. The mitral valve is normal in structure. Mild to moderate mitral valve regurgitation.  5. Tricuspid valve regurgitation is mild to moderate.  6. The aortic valve is tricuspid. Aortic valve regurgitation is not  visualized. No aortic stenosis is present.  7. The inferior vena cava is dilated in size with <50% respiratory variability, suggesting right atrial pressure of 15 mmHg. Comparison(s): Prior images reviewed side by side. The left ventricular function is unchanged. There is further increase in estimated right atrial and pulmonary artery pressure. Left ventricular diastolic parameters and mitral insufficiency appear largely unchanged. FINDINGS  Left Ventricle: Left ventricular ejection fraction, by estimation, is 60 to 65%. The left ventricle has normal function. The left ventricle has no regional wall motion abnormalities. The  left ventricular internal cavity size was normal in size. There is  no left ventricular hypertrophy. Left ventricular diastolic parameters are indeterminate. Indeterminate filling pressures. Right Ventricle: The right ventricular size is normal. No increase in right ventricular wall thickness. Right ventricular systolic function is normal. There is moderately elevated pulmonary artery systolic pressure. The tricuspid regurgitant velocity is 3.06 m/s, and with an assumed right atrial pressure of 15 mmHg, the estimated right ventricular systolic pressure is 41.6 mmHg. Left Atrium: Left atrial size was mildly dilated. Right Atrium: Right atrial size was normal in size. Pericardium: There is no evidence of pericardial effusion. Mitral Valve: The mitral valve is normal in structure. Mild to moderate mitral valve regurgitation. Tricuspid Valve: The tricuspid valve is normal in structure. Tricuspid valve regurgitation is mild to moderate. Aortic Valve: The aortic valve is tricuspid. Aortic valve regurgitation is not visualized. No aortic stenosis is present. Aortic valve peak gradient measures 8.3 mmHg. Pulmonic Valve: The pulmonic valve was normal in structure. Pulmonic valve regurgitation is trivial. Aorta: The aortic root and ascending aorta are structurally normal, with no evidence of dilitation. Venous: The inferior vena cava is dilated in size with less than 50% respiratory variability, suggesting right atrial pressure of 15 mmHg. IAS/Shunts: No atrial level shunt detected by color flow Doppler.  LEFT VENTRICLE PLAX 2D LVIDd:         4.20 cm   Diastology LVIDs:         2.20 cm   LV e' medial:    5.57 cm/s LV PW:         1.10 cm   LV E/e' medial:  16.6 LV IVS:        1.10 cm   LV e' lateral:   7.51 cm/s LVOT diam:     1.90 cm   LV E/e' lateral: 12.3 LV SV:         63 LV SV Index:   37 LVOT Area:     2.84 cm  RIGHT VENTRICLE             IVC RV Basal diam:  2.60 cm     IVC diam: 2.60 cm RV S prime:     13.30 cm/s TAPSE  (M-mode): 3.0 cm LEFT ATRIUM             Index        RIGHT ATRIUM           Index LA diam:        4.00 cm 2.31 cm/m   RA Area:     10.70 cm LA Vol (A2C):   59.7 ml 34.46 ml/m  RA Volume:   24.20 ml  13.97 ml/m LA Vol (A4C):   52.1 ml 30.08 ml/m LA Biplane Vol: 58.3 ml 33.66 ml/m  AORTIC VALVE                 PULMONIC VALVE AV Area (Vmax): 1.85 cm  PV Vmax:       0.66 m/s AV Vmax:        144.00 cm/s  PV Peak grad:  1.8 mmHg AV Peak Grad:   8.3 mmHg LVOT Vmax:      94.20 cm/s LVOT Vmean:     57.600 cm/s LVOT VTI:       0.223 m  AORTA Ao Root diam: 3.00 cm Ao Asc diam:  3.10 cm MITRAL VALVE               TRICUSPID VALVE MV Area (PHT): 5.55 cm    TR Peak grad:   37.5 mmHg MV Decel Time: 137 msec    TR Vmax:        306.00 cm/s MV E velocity: 92.42 cm/s MV A velocity: 93.53 cm/s  SHUNTS MV E/A ratio:  0.99        Systemic VTI:  0.22 m                            Systemic Diam: 1.90 cm Sanda Klein MD Electronically signed by Sanda Klein MD Signature Date/Time: 10/24/2021/3:32:23 PM    Final     Cardiac Studies   ECHO: 06/30/2020  1. Left ventricular ejection fraction, by estimation, is 55 to 60%. The  left ventricle has normal function. The left ventricle has no regional  wall motion abnormalities. There is mild left ventricular hypertrophy.  Left ventricular diastolic parameters  are consistent with Grade I diastolic dysfunction (impaired relaxation).   2. Right ventricular systolic function is normal. The right ventricular  size is normal. There is mildly elevated pulmonary artery systolic  pressure. The estimated right ventricular systolic pressure is 62.7 mmHg.   3. Left atrial size was mildly dilated.   4. The mitral valve is normal in structure. Mild mitral valve  regurgitation. No evidence of mitral stenosis.   5. The aortic valve is tricuspid. Aortic valve regurgitation is not  visualized. No aortic stenosis is present.   6. The inferior vena cava is dilated in size with >50%  respiratory  variability, suggesting right atrial pressure of 8 mmHg.    PCI/CARDIAC CATH: 06/30/2004 PCI:  IMPRESSION/PLAN:  Successful drug-eluting stent placement in the proximal  LAD.  The patient will be continued on Plavix for one year.  Aspirin will be continued indefinitely.  She will be ambulated in two hours.  Attention will be given to improved hypertension control. CARDIAC CATH: 06/30/2004  ANGIOGRAPHIC FINDINGS:  1.  The left main coronary artery is free of significant __________ coronary atherosclerosis.  2.  The left anterior descending is a medium caliber vessel with a large      bifurcating proximal diagonal branch and two smaller distal branches.      There is approximately 80% hypodense stenosis in the proximal left      anterior descending followed by 40% vessel stenosis, 30% distal stenosis and 40% apical stenosis. The large proximal bifurcating diagonal branch has a 50% ostial stenosis and 50% proximal stenosis but no major flow limiting disease.  3.  The circumflex coronary artery is a medium to large caliber vessel with four obtuse marginal branches. The first obtuse marginal branch has a 30% stenosis and there are other minor Luminal irregularities within the      circumflex proper including a 20% mid vessel stenosis.  4.  The right coronary artery is a dominant vessel that is medium in      caliber.  There is 20% proximal vessel stenosis and a 50% mid vessel      stenosis.  5.  Selective renal angiography was performed showing patent bilateral renal arteries without significant flow limiting obstruction.  6.  Left ventriculography is performed in the RAO projection revealing an      ejection fraction greater than 55% in the setting of ventricular ectopy      with 1+ mitral regurgitation. There also looks to be some degree of      apical hypokinesis.    DIAGNOSES:  1.  Single vessel obstructive coronary artery disease of the left anterior      descending in the  proximal vessel presumably at or near the previous      arthrectomy site. The proximal diagonal branch has disease that is      nonobstructive. No other major flow limiting disease is noted in the      remaining coronaries.  2.  Patent bilateral renal arteries.  3.  __________ ejection fraction greater than 55% with apical hypokinesis      and 1+ mitral regurgitation in the setting of ectopy. Left ventricular      diastolic pressure is 16 mmHg.  4.  Systemic hypertension.  Patient Profile     76 y.o. female with complex PMH, including chronic kidney disease stage 4 (follows with [facility]/Dr. Hollie Salk), chronic diastolic heart failure, CAD with prior PCI, statin intolerance who presented with acute on chronic diastolic heart failure.  Assessment & Plan    Acute on chronic diastolic heart failure, with stage 4 CKD -now on 80 mg IV lasix BID.  -admission weight 68.1 kg, weight today 62.4 kg. Net negative ~1 L, which doesn't agree with weights -Cr variable, from 2.99 to 2.55 to 2.99 again today -If she stops responding to lasix or her Cr increases, would recommend getting assistance from [facility] as they follow her as an outpatient. -avoid hypotension with diuresis. Only on hydralazine at this time. Monitor BP closely  Hypokalemic: K 3.3. Given continued diuresis, will replete with 40 meq K today   History of renal vein thrombosis and DVT, on long term apixaban: per primary team  CAD s/p prior PCI Elevated troponin (peak 20) Statin adverse reaction -hsTn most consistent with demand, given heart failure and CKD -asymptomatic. Felt poorly on beta blocker, no indication to restart at this time -no aspirin as she is on apixaban -was on statin previously, stopped earlier this year for concern for statin induced transaminitis/myalgia (see admission 03/2021, was on rosuvastatin 40 mg)  For questions or updates, please contact [place] Please consult [email or web address] for contact  info under     Signed, Buford Dresser, MD  10/26/2021, 11:56 AM

## 2021-10-26 NOTE — Progress Notes (Signed)
Mobility Specialist Progress Note:   10/26/21 0912  Mobility  Activity Ambulated with assistance in hallway  Level of Assistance Standby assist, set-up cues, supervision of patient - no hands on  Assistive Device None  Distance Ambulated (ft) 300 ft  Activity Response Tolerated well  $Mobility charge 1 Mobility   Pt received in chair willing to participate in mobility. No complaints of pain. Left in chair with call bell in reach and all needs met.   [facility] Larayne Baxley Mobility Specialist

## 2021-10-27 DIAGNOSIS — I2510 Atherosclerotic heart disease of native coronary artery without angina pectoris: Secondary | ICD-10-CM | POA: Diagnosis not present

## 2021-10-27 DIAGNOSIS — E7800 Pure hypercholesterolemia, unspecified: Secondary | ICD-10-CM | POA: Diagnosis not present

## 2021-10-27 DIAGNOSIS — I5033 Acute on chronic diastolic (congestive) heart failure: Secondary | ICD-10-CM | POA: Diagnosis not present

## 2021-10-27 DIAGNOSIS — E1159 Type 2 diabetes mellitus with other circulatory complications: Secondary | ICD-10-CM | POA: Diagnosis not present

## 2021-10-27 DIAGNOSIS — E1169 Type 2 diabetes mellitus with other specified complication: Secondary | ICD-10-CM | POA: Diagnosis not present

## 2021-10-27 DIAGNOSIS — N184 Chronic kidney disease, stage 4 (severe): Secondary | ICD-10-CM | POA: Diagnosis not present

## 2021-10-27 LAB — BASIC METABOLIC PANEL
Anion gap: 13 (ref 5–15)
BUN: 92 mg/dL — ABNORMAL HIGH (ref 8–23)
CO2: 24 mmol/L (ref 22–32)
Calcium: 9 mg/dL (ref 8.9–10.3)
Chloride: 97 mmol/L — ABNORMAL LOW (ref 98–111)
Creatinine, Ser: 2.8 mg/dL — ABNORMAL HIGH (ref 0.44–1.00)
GFR, Estimated: 17 mL/min — ABNORMAL LOW (ref >60)
Glucose, Bld: 95 mg/dL (ref 70–99)
Potassium: 3.7 mmol/L (ref 3.5–5.1)
Sodium: 134 mmol/L — ABNORMAL LOW (ref 135–145)

## 2021-10-27 LAB — URINALYSIS, ROUTINE W REFLEX MICROSCOPIC
Bilirubin Urine: NEGATIVE
Glucose, UA: NEGATIVE mg/dL
Hgb urine dipstick: NEGATIVE
Ketones, ur: NEGATIVE mg/dL
Nitrite: NEGATIVE
Protein, ur: NEGATIVE mg/dL
Specific Gravity, Urine: 1.009 (ref 1.005–1.030)
pH: 5 (ref 5.0–8.0)

## 2021-10-27 LAB — GLUCOSE, CAPILLARY
Glucose-Capillary: 101 mg/dL — ABNORMAL HIGH (ref 70–99)
Glucose-Capillary: 111 mg/dL — ABNORMAL HIGH (ref 70–99)
Glucose-Capillary: 133 mg/dL — ABNORMAL HIGH (ref 70–99)

## 2021-10-27 LAB — MAGNESIUM: Magnesium: 2 mg/dL (ref 1.7–2.4)

## 2021-10-27 MED ORDER — METOLAZONE 2.5 MG PO TABS
2.5000 mg | ORAL_TABLET | Freq: Once | ORAL | Status: AC
  Administered 2021-10-27: 2.5 mg via ORAL
  Filled 2021-10-27: qty 1

## 2021-10-27 NOTE — Progress Notes (Signed)
Orthopedic Tech Progress Note Patient Details:  [name] February 25, 1946 [id number]  Ortho Devices Type of Ortho Device: Haematologist Ortho Device/Splint Location: bi-lateral Ortho Device/Splint Interventions: Ordered, Application, Adjustment   Post Interventions Patient Tolerated: Well Instructions Provided: Care of device, Adjustment of device  [name] 10/27/2021, 8:29 PM

## 2021-10-27 NOTE — Progress Notes (Signed)
Mobility Specialist Progress Note:   10/27/21 0903  Mobility  Activity Ambulated with assistance in hallway  Level of Assistance Standby assist, set-up cues, supervision of patient - no hands on  Assistive Device None  Distance Ambulated (ft) 250 ft  Activity Response Tolerated well  $Mobility charge 1 Mobility   Pt received up in room willing to participate in mobility. Complaints of legs feeling "tight". Left in chair with call bell in reach and all needs met.   [facility] Mercedez Boule Mobility Specialist

## 2021-10-27 NOTE — Care Management Important Message (Signed)
Important Message  Patient Details  Name: [name] MRN: [id number] Date of Birth: 22-Oct-1945   Medicare Important Message Given:  Yes     Shelda Altes 10/27/2021, 7:55 AM

## 2021-10-27 NOTE — Progress Notes (Addendum)
Progress Note  Patient Name: Jaquaya [name] Courser Date of Encounter: 10/27/2021  [facility] Cardiologist: [name], MD   Subjective   Denies any chest pain or SOB.  Inpatient Medications    Scheduled Meds:  apixaban  2.5 mg Oral BID   budesonide  3 mg Oral Daily   famotidine  20 mg Oral QHS   FLUoxetine  20 mg Oral Daily   furosemide  80 mg Intravenous BID   hydrALAZINE  25 mg Oral BID   lipase/protease/amylase  36,000 Units Oral TID WC   mirtazapine  7.5 mg Oral QHS   multivitamin with minerals  1 tablet Oral Daily   pantoprazole  40 mg Oral Daily   sodium chloride flush  3 mL Intravenous Q12H   Continuous Infusions:  PRN Meds: acetaminophen **OR** acetaminophen, bisacodyl, hydrALAZINE, loperamide, ondansetron **OR** ondansetron (ZOFRAN) IV, polyethylene glycol, traZODone   Vital Signs    Vitals:   10/26/21 1108 10/26/21 1554 10/27/21 0650 10/27/21 0844  BP:  (!) 109/56 (!) 156/81 (!) 132/56  Pulse:  79 85   Resp:  18 18 18   Temp:  97.9 F (36.6 C) 98.1 F (36.7 C) 98.4 F (36.9 C)  TempSrc:  Oral Oral Oral  SpO2:  98% 98% 98%  Weight: 62.4 kg  64.3 kg   Height:        Intake/Output Summary (Last 24 hours) at 10/27/2021 0904 Last data filed at 10/26/2021 2300 Gross per 24 hour  Intake 770 ml  Output 1300 ml  Net -530 ml       10/27/2021    6:50 AM 10/26/2021   11:08 AM 10/26/2021    6:23 AM  Last 3 Weights  Weight (lbs) 141 lb 12.1 oz 137 lb 8 oz 142 lb 6.7 oz  Weight (kg) 64.3 kg 62.37 kg 64.6 kg      Telemetry    NSR - Personally Reviewed  ECG    NO new EKG to review - Personally Reviewed  Physical Exam   GEN: Well nourished, well developed in no acute distress HEENT: Normal NECK: No JVD; No carotid bruits LYMPHATICS: No lymphadenopathy CARDIAC:RRR, no murmurs, rubs, gallops RESPIRATORY:  Clear to auscultation without rales, wheezing or rhonchi  ABDOMEN: Soft, non-tender, non-distended MUSCULOSKELETAL:  1+ BLE edema; No  deformity  SKIN: Warm and dry NEUROLOGIC:  Alert and oriented x 3 PSYCHIATRIC:  Normal affect   Labs    High Sensitivity Troponin:   Recent Labs  Lab 10/19/21 1819 10/19/21 2009 10/23/21 2012 10/24/21 0214  TROPONINIHS 20* 20* 18* 16      Chemistry Recent Labs  Lab 10/20/21 1132 10/23/21 2012 10/23/21 2012 10/24/21 0735 10/25/21 0731 10/26/21 0442 10/27/21 0407  NA  --  142   < > 143 139 139 134*  K  --  4.3   < > 4.5 4.3 3.3* 3.7  CL  --  106   < > 108 101 103 97*  CO2  --  20*   < > 24 29 26 24   GLUCOSE  --  126*   < > 88 77 94 95  BUN  --  83*   < > 82* 84* 89* 92*  CREATININE  --  3.36*   < > 2.99* 2.55* 2.99* 2.80*  CALCIUM  --  10.0   < > 9.2 9.2 8.8* 9.0  MG 1.2*  --   --  1.7  --   --  2.0  PROT  --  7.7  --   --   --   --   --  ALBUMIN  --  3.9  --   --   --   --   --   AST  --  14*  --   --   --   --   --   ALT  --  12  --   --   --   --   --   ALKPHOS  --  96  --   --   --   --   --   BILITOT  --  0.3  --   --   --   --   --   GFRNONAA  --  14*   < > 16* 19* 16* 17*  ANIONGAP  --  16*   < > 11 9 10 13    < > = values in this interval not displayed.     Lipids No results for input(s): "CHOL", "TRIG", "HDL", "LABVLDL", "LDLCALC", "CHOLHDL" in the last 168 hours.  Hematology Recent Labs  Lab 10/24/21 0735 10/25/21 0731 10/26/21 0442  WBC 13.4* 9.9 8.6  RBC 3.33* 3.38* 3.02*  3.02*  HGB 8.8* 8.9* 8.2*  HCT 28.8* 29.0* 25.7*  MCV 86.5 85.8 85.1  MCH 26.4 26.3 27.2  MCHC 30.6 30.7 31.9  RDW 20.5* 20.1* 19.9*  PLT 224 193 202    Thyroid No results for input(s): "TSH", "FREET4" in the last 168 hours.  BNP Recent Labs  Lab 10/23/21 2012  BNP 352.1*     DDimer No results for input(s): "DDIMER" in the last 168 hours.   Radiology    No results found.  Cardiac Studies   ECHO: 06/30/2020  1. Left ventricular ejection fraction, by estimation, is 55 to 60%. The  left ventricle has normal function. The left ventricle has no regional  wall  motion abnormalities. There is mild left ventricular hypertrophy.  Left ventricular diastolic parameters  are consistent with Grade I diastolic dysfunction (impaired relaxation).   2. Right ventricular systolic function is normal. The right ventricular  size is normal. There is mildly elevated pulmonary artery systolic  pressure. The estimated right ventricular systolic pressure is 97.5 mmHg.   3. Left atrial size was mildly dilated.   4. The mitral valve is normal in structure. Mild mitral valve  regurgitation. No evidence of mitral stenosis.   5. The aortic valve is tricuspid. Aortic valve regurgitation is not  visualized. No aortic stenosis is present.   6. The inferior vena cava is dilated in size with >50% respiratory  variability, suggesting right atrial pressure of 8 mmHg.    PCI/CARDIAC CATH: 06/30/2004 PCI:  IMPRESSION/PLAN:  Successful drug-eluting stent placement in the proximal  LAD.  The patient will be continued on Plavix for one year.  Aspirin will be continued indefinitely.  She will be ambulated in two hours.  Attention will be given to improved hypertension control. CARDIAC CATH: 06/30/2004  ANGIOGRAPHIC FINDINGS:  1.  The left main coronary artery is free of significant __________ coronary atherosclerosis.  2.  The left anterior descending is a medium caliber vessel with a large      bifurcating proximal diagonal branch and two smaller distal branches.      There is approximately 80% hypodense stenosis in the proximal left      anterior descending followed by 40% vessel stenosis, 30% distal stenosis and 40% apical stenosis. The large proximal bifurcating diagonal branch has a 50% ostial stenosis and 50% proximal stenosis but no major flow limiting disease.  3.  The circumflex coronary  artery is a medium to large caliber vessel with four obtuse marginal branches. The first obtuse marginal branch has a 30% stenosis and there are other minor Luminal irregularities within the       circumflex proper including a 20% mid vessel stenosis.  4.  The right coronary artery is a dominant vessel that is medium in      caliber. There is 20% proximal vessel stenosis and a 50% mid vessel      stenosis.  5.  Selective renal angiography was performed showing patent bilateral renal arteries without significant flow limiting obstruction.  6.  Left ventriculography is performed in the RAO projection revealing an      ejection fraction greater than 55% in the setting of ventricular ectopy      with 1+ mitral regurgitation. There also looks to be some degree of      apical hypokinesis.    DIAGNOSES:  1.  Single vessel obstructive coronary artery disease of the left anterior      descending in the proximal vessel presumably at or near the previous      arthrectomy site. The proximal diagonal branch has disease that is      nonobstructive. No other major flow limiting disease is noted in the      remaining coronaries.  2.  Patent bilateral renal arteries.  3.  __________ ejection fraction greater than 55% with apical hypokinesis      and 1+ mitral regurgitation in the setting of ectopy. Left ventricular      diastolic pressure is 16 mmHg.  4.  Systemic hypertension.  Patient Profile     76 y.o. female with complex PMH, including chronic kidney disease stage 4 (follows with [facility]/Dr. Hollie Salk), chronic diastolic heart failure, CAD with prior PCI, statin intolerance who presented with acute on chronic diastolic heart failure.  Assessment & Plan    Acute on chronic diastolic heart failure, with stage 4 CKD -now on 80 mg IV lasix BID.  -admission weight 68.1 kg, weight today 64.3 kg.  -she put out 1.3L yesterday and is net neg 1.3L since admit -Cr variable, from 2.55 to 2.99 and down to 2.8 today -Recommend renal consult to help with diuretic management -avoid hypotension with diuresis. Only on hydralazine 58m BID at this time. Monitor BP closely  Hypokalemic:  -K 3.3  yesterday and 3.7 today -continue to follow and replete as needed while diuresing   History of renal vein thrombosis and DVT -on long term apixaban: per primary team  CAD s/p prior PCI Elevated troponin (peak 20) Statin adverse reaction -hsTn most consistent with demand, given heart failure and CKD -asymptomatic. Felt poorly on beta blocker, no indication to restart at this time -no aspirin as she is on apixaban -was on statin previously, stopped earlier this year for concern for statin induced transaminitis/myalgia (see admission 03/2021, was on rosuvastatin 40 mg) -would benefit from outpt lipid clinic to consider addition of PCSK9i   I have spent a total of 30 minutes with patient reviewing 2D echo , telemetry, EKGs, labs and examining patient as well as establishing an assessment and plan that was discussed with the patient.  > 50% of time was spent in direct patient care.     For questions or updates, please contact [place]stadtPlease consult [email or web address] for contact info under     Signed, TFransico Him MD  10/27/2021, 9:04 AM

## 2021-10-27 NOTE — Progress Notes (Addendum)
Progress Note   Patient: [name] GHW:[id number] DOB: 1945-10-26 DOA: 10/23/2021     2 DOS: the patient was seen and examined on 10/27/2021   Brief hospital course: [name] was admitted to the hospital with the working diagnosis of decompensated heart failure.   76 yo female with the past medical history of CKD stage 4, diastolic heart failure, coronary artery disease, DVT, CVA, hypertension, T2DM, and chronic gastritis who presented with dyspnea. Recent hospitalization 08/3 to 08/4 for decompensated heart failure. At home patient had recurrent dyspnea and lower extremity edema, 911 was called and patient has brought back to the hospital. On her initial physical examination her blood pressure was 142/90, HR 93, RR 14 and 02 saturation 98%, lungs with bibasilar rales but not wheezing, heart with S1 and S2 present and rhythmic, abdomen not distended, positive lower extremity edema ++.   Na 142, K 4,3 CL 106 bicarbonate 20, glucose 126, bun 83 cr 3,36  BNP 352  High sensitive troponin 18 and 16  Wbc 14, hgb 9,3 plt 244   Chest radiograph with bilateral hilar vascular congestion, fluid in the right fissure, and cephalization of the vasculature.   EKG 91 bpm, normal axis, right bundle branch block, sinus rhythm with no significant ST segment or T wave changes.   Patient was placed on furosemide for diuresis.  Added intermittent metolazone.  Slow diuresis due to low GFR.   Assessment and Plan: * Acute on chronic heart failure with preserved ejection fraction (HFpEF) (HCC) Echocardiogram with preserved LV systolic function 60 to 78%, RV with preserved RV systolic function. Mild to moderate MR, mild to moderate TR.   Elevated troponin due to heart failure exacerbation.  Demand ischemia.   Patient continue with edema at her lower extremities.  Documented urine output 1.300 cc.  Systolic blood pressure 938 to 117 mmHg.   Plan to continue diuresis with furosemide 80 mg q12  hrs After load reduction with hydralazine. Will repeat dose of metolazone today (had 2,5 mg on 08/10).  Not able to use RAS inhibition due to low GFR.      CKD (chronic kidney disease), stage IV (HCC) Hypokalemia. Hypomagnesemia, hyponatremia.  Edema slowly improving, but not back to baseline.  Renal function with serum cr at 2,80 with K at 3,7 and serum bicarbonate at 24, Na is 134.    Plan to continue diuresis with oral furosemide and metolazone.  Add unna boots. [place] lower extremities negative for DVT.  US abdomen with bilateral simple renal cysts, with no hydronephrosis.  Follow up renal function in am.  Check urine analysis.  Anemia of chronic renal disease.   Coronary artery disease involving native coronary artery of native heart No clinical signs of acute coronary syndrome.   Hypertension associated with diabetes ([name]) Continue close blood pressure monitoring, continue diuresis.   Type 2 diabetes mellitus with hyperlipidemia (HCC) Fasting glucose this am 95. Plan to continue insulin sliding scale for glucose cover and monitoring.   Chronic gastritis Continue with PPI  Renal vein thrombosis (HCC) Continue anticoagulation with apixaban.         Subjective: Patient with improvement in dyspnea, and edema but not back to baseline, no chest pain,   Physical Exam: Vitals:   10/26/21 1554 10/27/21 0650 10/27/21 0844 10/27/21 1300  BP: (!) 109/56 (!) 156/81 (!) 132/56 (!) 117/50  Pulse: 79 85  86  Resp: 18 18 18 18   Temp: 97.9 F (36.6 C) 98.1 F (36.7 C) 98.4 F (  36.9 C) 98.6 F (37 C)  TempSrc: Oral Oral Oral Oral  SpO2: 98% 98% 98% 99%  Weight:  64.3 kg    Height:       Neurology awake and alert ENT with mild pallor Cardiovascular with S1 and S2 present and rhythmic Respiratory with no rales or wheezing Abdomen not distended Positive lower extremity edema +++ pitting bilaterally  Data Reviewed:    Family Communication: I wad not able to reach her  daughter, I left a message,   Disposition: Status is: Inpatient Remains inpatient appropriate because: aggressive diuresis.   Planned Discharge Destination: Home    Author: Tawni Millers, MD 10/27/2021 1:50 PM  For on call review [email or web address].

## 2021-10-27 NOTE — TOC Progression Note (Signed)
Transition of Care [facility]) - Progression Note    Patient Details  Name: [name] MRN: [id number] Date of Birth: Aug 24, 1945  [facility]) CM/SW Contact  Zenon Mayo, RN Phone Number: 10/27/2021, 4:14 PM  Clinical Narrative:    from home with family, CHF, IV lasix indep, room air. TOC following.        Expected Discharge Plan and Services                                                 Social Determinants of Health (SDOH) Interventions Food Insecurity Interventions: Intervention Not Indicated Financial Strain Interventions: Intervention Not Indicated Housing Interventions: Intervention Not Indicated Transportation Interventions: Intervention Not Indicated  Readmission Risk Interventions    09/04/2020   12:47 PM  Readmission Risk Prevention Plan  Transportation Screening Complete  Medication Review (RN Care Manager) Complete  HRI or [place] Complete  SW Recovery Care/Counseling Consult Complete  Palliative Care Screening Not [place] Not Applicable

## 2021-10-28 DIAGNOSIS — N179 Acute kidney failure, unspecified: Secondary | ICD-10-CM | POA: Diagnosis not present

## 2021-10-28 DIAGNOSIS — I2510 Atherosclerotic heart disease of native coronary artery without angina pectoris: Secondary | ICD-10-CM | POA: Diagnosis not present

## 2021-10-28 DIAGNOSIS — I5033 Acute on chronic diastolic (congestive) heart failure: Secondary | ICD-10-CM | POA: Diagnosis not present

## 2021-10-28 DIAGNOSIS — E1169 Type 2 diabetes mellitus with other specified complication: Secondary | ICD-10-CM | POA: Diagnosis not present

## 2021-10-28 DIAGNOSIS — N189 Chronic kidney disease, unspecified: Secondary | ICD-10-CM

## 2021-10-28 LAB — BASIC METABOLIC PANEL
Anion gap: 12 (ref 5–15)
BUN: 105 mg/dL — ABNORMAL HIGH (ref 8–23)
CO2: 26 mmol/L (ref 22–32)
Calcium: 9.1 mg/dL (ref 8.9–10.3)
Chloride: 100 mmol/L (ref 98–111)
Creatinine, Ser: 2.88 mg/dL — ABNORMAL HIGH (ref 0.44–1.00)
GFR, Estimated: 17 mL/min — ABNORMAL LOW (ref >60)
Glucose, Bld: 124 mg/dL — ABNORMAL HIGH (ref 70–99)
Potassium: 3.8 mmol/L (ref 3.5–5.1)
Sodium: 138 mmol/L (ref 135–145)

## 2021-10-28 LAB — GLUCOSE, CAPILLARY
Glucose-Capillary: 119 mg/dL — ABNORMAL HIGH (ref 70–99)
Glucose-Capillary: 123 mg/dL — ABNORMAL HIGH (ref 70–99)
Glucose-Capillary: 191 mg/dL — ABNORMAL HIGH (ref 70–99)
Glucose-Capillary: 105 mg/dL — ABNORMAL HIGH (ref 70–99)

## 2021-10-28 MED ORDER — LOPERAMIDE HCL 2 MG PO CAPS
2.0000 mg | ORAL_CAPSULE | Freq: Four times a day (QID) | ORAL | Status: DC | PRN
  Administered 2021-10-28 – 2021-10-29 (×2): 2 mg via ORAL
  Filled 2021-10-28 (×2): qty 1

## 2021-10-28 NOTE — Progress Notes (Addendum)
Progress Note   Patient: [name] TOI:[id number] DOB: Dec 08, 1945 DOA: 10/23/2021     3 DOS: the patient was seen and examined on 10/28/2021   Brief hospital course: [name] was admitted to the hospital with the working diagnosis of decompensated heart failure.   76 yo female with the past medical history of CKD stage 4, diastolic heart failure, coronary artery disease, DVT, CVA, hypertension, T2DM, and chronic gastritis who presented with dyspnea. Recent hospitalization 08/3 to 08/4 for decompensated heart failure. At home patient had recurrent dyspnea and lower extremity edema, 911 was called and patient has brought back to the hospital. On her initial physical examination her blood pressure was 142/90, HR 93, RR 14 and 02 saturation 98%, lungs with bibasilar rales but not wheezing, heart with S1 and S2 present and rhythmic, abdomen not distended, positive lower extremity edema ++.   Na 142, K 4,3 CL 106 bicarbonate 20, glucose 126, bun 83 cr 3,36  BNP 352  High sensitive troponin 18 and 16  Wbc 14, hgb 9,3 plt 244   Chest radiograph with bilateral hilar vascular congestion, fluid in the right fissure, and cephalization of the vasculature.   EKG 91 bpm, normal axis, right bundle branch block, sinus rhythm with no significant ST segment or T wave changes.   Patient was placed on furosemide for diuresis.  Added intermittent metolazone.  Slow diuresis due to low GFR.   Assessment and Plan: * Acute on chronic heart failure with preserved ejection fraction (HFpEF) (HCC) Echocardiogram with preserved LV systolic function 60 to 83%, RV with preserved RV systolic function. Mild to moderate MR, mild to moderate TR.   Elevated troponin due to heart failure exacerbation.  Demand ischemia.   Patient continue with edema at her lower extremities.  Documented urine output 1.300 cc.  Systolic blood pressure 382 to 111 mmHg.   Plan to continue diuresis with furosemide 80 mg q12 hrs  changed to IV After load reduction with hydralazine. 08/10 and 08/11 metolazone 2,5 mg.  Not able to use RAS inhibition due to low GFR.      Acute kidney injury superimposed on chronic kidney disease ([place]) CKD stage IV with a base cr around 2,5  Hypertensive nephropathy.  Hypokalemia. Hypomagnesemia, hyponatremia.  US abdomen with bilateral simple renal cysts, with no hydronephrosis.  Urine analysis SG 1,009, negative protein, negative leukocytes.  Serum cr is 2,8 with K at 3,8 and serum bicarbonate at 26.  Lower extremity edema is improving, unna boots in place.   Plan to continue diuresis (42m Iv q12 hrs) with furosemide and follow up renal function in am.   Coronary artery disease involving native coronary artery of native heart No clinical signs of acute coronary syndrome.   Hypertension associated with diabetes ([place] Continue close blood pressure monitoring, continue diuresis.   Type 2 diabetes mellitus with hyperlipidemia (HCC) Fasting glucose this am 124 Plan to continue insulin sliding scale for glucose cover and monitoring.   Chronic gastritis Continue with PPI  Renal vein thrombosis (HCC) Continue anticoagulation with apixaban.       Subjective: Patient with improvement in edema and dyspnea, but not back to baseline   Physical Exam: Vitals:   10/27/21 2113 10/28/21 0504 10/28/21 0727 10/28/21 1215  BP: (!) 138/57 (!) 151/64 (!) 153/71 (!) 111/48  Pulse:   85 82  Resp: 18 16 18 19   Temp: 98.4 F (36.9 C) 98.1 F (36.7 C) 98.7 F (37.1 C)   TempSrc: Oral Oral Oral  SpO2: 98% 97% 98% 99%  Weight:  64.1 kg    Height:       Neurology awake and alert ENT with mild pallor Cardiovascular with S1 and S2 present and rhythmic with no gallops, rubs or murmurs Respiratory with no rales or wheezing Abdomen not distended Positive lower extremity edema + pitting, unna boots in place.  Data Reviewed:    Family Communication: I spoke over the phone with  the patient's daughter about patient's  condition, plan of care, prognosis and all questions were addressed.   Disposition: Status is: Inpatient Remains inpatient appropriate because: heart failure   Planned Discharge Destination: Home      Author: Tawni Millers, MD 10/28/2021 12:50 PM  For on call review [email or web address].

## 2021-10-28 NOTE — Evaluation (Signed)
Physical Therapy Re- Evaluation Patient Details Name: [name] MRN: [id number] DOB: 03/22/1945 Today's Date: 10/28/2021  History of Present Illness  Pt is a 76 y.o. female admitted 10/23/21 with continued SOB, BLE swelling and orthopnea. Of note, recent admission 8/3-10/20/21 with AKI and CHF, then d/c home. Other PMH includes CKD IV, HTN, CHF, CAD, DVT, CVA, DM2, gout, chronic gastritis, breast CA.  Clinical Impression  Pt admitted with above diagnosis. PT asked to re-evaluate pt. Pt has been mobilizing independently in hallway but did note that she has been having increasing difficulty getting up and down the stairs in her home to shower and do her laundry. She ambulates with trendelenburg pattern due to likely R glut medius weakness and pain from R low back down lateral thigh past knee to foot. Recommend further investigation of this with ortho MD as outpt as well as oupt PT. Pt was able to ambulate with more normal gait pattern and less pain when given R sided unilateral support and she is agreeable to using her cane at home. Practiced stairs and worked on different patterns and use of rail to make them less painful. Also recommend her using a backpack to carry clothes up and down stairs or ask family for assistance so she can hold rail on stairs. Continue to recommend return home.  Pt currently with functional limitations due to the deficits listed below (see PT Problem List). Pt will benefit from skilled PT to increase their independence and safety with mobility to allow discharge to the venue listed below.          Recommendations for follow up therapy are one component of a multi-disciplinary discharge planning process, led by the attending physician.  Recommendations may be updated based on patient status, additional functional criteria and insurance authorization.  Follow Up Recommendations Outpatient PT      Assistance Recommended at Discharge PRN  Patient can return home with the  following  Assistance with cooking/housework;Assist for transportation    Equipment Recommendations None recommended by PT  Recommendations for Other Services       Functional Status Assessment Patient has had a recent decline in their functional status and demonstrates the ability to make significant improvements in function in a reasonable and predictable amount of time.     Precautions / Restrictions Precautions Precautions: None Restrictions Weight Bearing Restrictions: No      Mobility  Bed Mobility               General bed mobility comments: received sitting in chair    Transfers Overall transfer level: Needs assistance Equipment used: None Transfers: Sit to/from Stand Sit to Stand: Supervision           General transfer comment: pt with obvious discomfort R hip with standing but no LOB    Ambulation/Gait Ambulation/Gait assistance: Supervision Gait Distance (Feet): 200 Feet Assistive device: None, 1 person hand held assist Gait Pattern/deviations: Step-through pattern, Decreased stride length, Trendelenburg Gait velocity: Decreased Gait velocity interpretation: 1.31 - 2.62 ft/sec, indicative of limited community ambulator   General Gait Details: pt with trendlenburg pattern due to R hip pain and likely glut medius weakness. ABle to ambulate without AD but when allowed to hold therapist's hand with her R hand like a cane normalized gait and decreased pain. Pt willing to use her SPC at home  Stairs Stairs: Yes Stairs assistance: Min guard Stair Management: One rail Right, Step to pattern, Sideways, Forwards Number of Stairs: 5 General stair comments: practiced  fwd and sideways for pain control with vc's for sequencing. Discussed using backpack to take clothes up and down steps to free up her hands or have grandchildren assist carrying them up and down for her  Wheelchair Mobility    Modified Rankin (Stroke Patients Only)       Balance Overall  balance assessment: Mild deficits observed, not formally tested                             High Level Balance Comments: slight instability noted with initial turning, pt self-corrected; stability improving with ambulation distance             Pertinent Vitals/Pain Pain Assessment Pain Assessment: Faces Faces Pain Scale: Hurts even more Pain Location: R hip down to foot Pain Descriptors / Indicators: Shooting Pain Intervention(s): Limited activity within patient's tolerance, Monitored during session    Home Living Family/patient expects to be discharged to:: Private residence Living Arrangements: Children Available Help at Discharge: Family;Available PRN/intermittently Type of Home: House Home Access: Level entry     Alternate Level Stairs-Number of Steps: 12 Home Layout: Two level;1/2 bath on main level (laundry upstairs) Home Equipment: Conservation officer, nature (2 wheels);Rollator (4 wheels);Cane - single point Additional Comments: pt reports she has had some difficult getting up and down the stairs to shower and do her laundry    Prior Function Prior Level of Function : Independent/Modified Independent             Mobility Comments: Independent without DME; does not drive. Reports increased difficulty doing more strenuous things around house, like vacuuming ADLs Comments: Stands to shower; family assists with iADLS as needed. Daughter does med Restaurant manager, fast food [employer]   Dominant Hand: Right    Extremity/Trunk Assessment   Upper Extremity Assessment Upper Extremity Assessment: Overall WFL for tasks assessed    Lower Extremity Assessment Lower Extremity Assessment: RLE deficits/detail RLE Deficits / Details: pt with pain R hip that seems to originate in R sided low back and extends down lateral thigh past lateral knee to foot. Trendelenburg gait pattern noted RLE Sensation: WNL RLE Coordination: WNL    Cervical / Trunk Assessment Cervical / Trunk  Assessment: Normal  Communication   Communication: No difficulties  Cognition Arousal/Alertness: Awake/alert Behavior During Therapy: WFL for tasks assessed/performed, Flat affect Overall Cognitive Status: Within Functional Limits for tasks assessed                                          General Comments General comments (skin integrity, edema, etc.): VSS    Exercises     Assessment/Plan    PT Assessment Patient needs continued PT services  PT Problem List Decreased strength;Decreased mobility;Pain       PT Treatment Interventions DME instruction;Gait training;Stair training;Functional mobility training;Therapeutic activities;Therapeutic exercise;Patient/family education;Neuromuscular re-education    PT Goals (Current goals can be found in the Care Plan section)  Acute Rehab PT Goals Patient Stated Goal: return home PT Goal Formulation: With patient Time For Goal Achievement: 11/11/21 Potential to Achieve Goals: Good    Frequency Min 3X/week     Co-evaluation               AM-PAC PT "6 Clicks" Mobility  Outcome Measure Help needed turning from your back to your side while in a flat bed without using  bedrails?: None Help needed moving from lying on your back to sitting on the side of a flat bed without using bedrails?: None Help needed moving to and from a bed to a chair (including a wheelchair)?: None Help needed standing up from a chair using your arms (e.g., wheelchair or bedside chair)?: None Help needed to walk in hospital room?: A Little Help needed climbing 3-5 steps with a railing? : A Little 6 Click Score: 22    End of Session   Activity Tolerance: Patient tolerated treatment well Patient left: in chair;with call bell/phone within reach Nurse Communication: Mobility status PT Visit Diagnosis: Other abnormalities of gait and mobility (R26.89)    Time: 6161-2240 PT Time Calculation (min) (ACUTE ONLY): 20 min   Charges:   PT  Evaluation $PT Re-evaluation: 1 Re-eval          Leighton Roach, PT  Acute Rehab Services Secure chat preferred Office [place] 10/28/2021, 4:28 PM

## 2021-10-28 NOTE — Progress Notes (Signed)
Progress Note  Patient Name: [name] Date of Encounter: 10/28/2021  [facility] Cardiologist: [name], MD   Subjective   No CP.  No SOB at rest.  LE swelling has improved.  Has persistent DOE (also improved).  Inpatient Medications    Scheduled Meds:  apixaban  2.5 mg Oral BID   budesonide  3 mg Oral Daily   famotidine  20 mg Oral QHS   FLUoxetine  20 mg Oral Daily   furosemide  80 mg Intravenous BID   hydrALAZINE  25 mg Oral BID   lipase/protease/amylase  36,000 Units Oral TID WC   mirtazapine  7.5 mg Oral QHS   multivitamin with minerals  1 tablet Oral Daily   pantoprazole  40 mg Oral Daily   sodium chloride flush  3 mL Intravenous Q12H   Continuous Infusions:  PRN Meds: acetaminophen **OR** acetaminophen, bisacodyl, hydrALAZINE, loperamide, ondansetron **OR** ondansetron (ZOFRAN) IV, polyethylene glycol, traZODone   Vital Signs    Vitals:   10/27/21 1300 10/27/21 2113 10/28/21 0504 10/28/21 0727  BP: (!) 117/50 (!) 138/57 (!) 151/64 (!) 153/71  Pulse: 86   85  Resp: 18 18 16 18   Temp: 98.6 F (37 C) 98.4 F (36.9 C) 98.1 F (36.7 C) 98.7 F (37.1 C)  TempSrc: Oral Oral Oral Oral  SpO2: 99% 98% 97% 98%  Weight:   64.1 kg   Height:        Intake/Output Summary (Last 24 hours) at 10/28/2021 1003 Last data filed at 10/28/2021 2800 Gross per 24 hour  Intake 240 ml  Output 1800 ml  Net -1560 ml      10/28/2021    5:04 AM 10/27/2021    6:50 AM 10/26/2021   11:08 AM  Last 3 Weights  Weight (lbs) 141 lb 6.4 oz 141 lb 12.1 oz 137 lb 8 oz  Weight (kg) 64.139 kg 64.3 kg 62.37 kg      Telemetry    NSR - Personally Reviewed  Physical Exam   GEN: Well nourished, well developed in no acute distress HEENT: Normal NECK: No JVD; No carotid bruits LYMPHATICS: No lymphadenopathy CARDIAC:RRR, no murmurs, rubs, gallops RESPIRATORY:  Clear to auscultation without rales, wheezing or rhonchi  ABDOMEN: Soft, non-tender,  non-distended MUSCULOSKELETAL:  1+ BLE edema; legs are wrapped today; No deformity  SKIN: Warm and dry NEUROLOGIC:  Alert and oriented x 3 PSYCHIATRIC:  Normal affect    Labs    High Sensitivity Troponin:   Recent Labs  Lab 10/19/21 1819 10/19/21 2009 10/23/21 2012 10/24/21 0214  TROPONINIHS 20* 20* 18* 16     Chemistry Recent Labs  Lab 10/23/21 2012 10/24/21 0735 10/25/21 0731 10/26/21 0442 10/27/21 0407  NA 142 143 139 139 134*  K 4.3 4.5 4.3 3.3* 3.7  CL 106 108 101 103 97*  CO2 20* 24 29 26 24   GLUCOSE 126* 88 77 94 95  BUN 83* 82* 84* 89* 92*  CREATININE 3.36* 2.99* 2.55* 2.99* 2.80*  CALCIUM 10.0 9.2 9.2 8.8* 9.0  MG  --  1.7  --   --  2.0  PROT 7.7  --   --   --   --   ALBUMIN 3.9  --   --   --   --   AST 14*  --   --   --   --   ALT 12  --   --   --   --   ALKPHOS 96  --   --   --   --  BILITOT 0.3  --   --   --   --   GFRNONAA 14* 16* 19* 16* 17*  ANIONGAP 16* 11 9 10 13     Lipids No results for input(s): "CHOL", "TRIG", "HDL", "LABVLDL", "LDLCALC", "CHOLHDL" in the last 168 hours.  Hematology Recent Labs  Lab 10/24/21 0735 10/25/21 0731 10/26/21 0442  WBC 13.4* 9.9 8.6  RBC 3.33* 3.38* 3.02*  3.02*  HGB 8.8* 8.9* 8.2*  HCT 28.8* 29.0* 25.7*  MCV 86.5 85.8 85.1  MCH 26.4 26.3 27.2  MCHC 30.6 30.7 31.9  RDW 20.5* 20.1* 19.9*  PLT 224 193 202   Thyroid No results for input(s): "TSH", "FREET4" in the last 168 hours.  BNP Recent Labs  Lab 10/23/21 2012  BNP 352.1*    DDimer No results for input(s): "DDIMER" in the last 168 hours.   Radiology    No results found.  Cardiac Studies   ECHO: 06/30/2020  1. Left ventricular ejection fraction, by estimation, is 55 to 60%. The  left ventricle has normal function. The left ventricle has no regional  wall motion abnormalities. There is mild left ventricular hypertrophy.  Left ventricular diastolic parameters  are consistent with Grade I diastolic dysfunction (impaired relaxation).   2.  Right ventricular systolic function is normal. The right ventricular  size is normal. There is mildly elevated pulmonary artery systolic  pressure. The estimated right ventricular systolic pressure is 63.8 mmHg.   3. Left atrial size was mildly dilated.   4. The mitral valve is normal in structure. Mild mitral valve  regurgitation. No evidence of mitral stenosis.   5. The aortic valve is tricuspid. Aortic valve regurgitation is not  visualized. No aortic stenosis is present.   6. The inferior vena cava is dilated in size with >50% respiratory  variability, suggesting right atrial pressure of 8 mmHg.    PCI/CARDIAC CATH: 06/30/2004 PCI:  IMPRESSION/PLAN:  Successful drug-eluting stent placement in the proximal  LAD.  The patient will be continued on Plavix for one year.  Aspirin will be continued indefinitely.  She will be ambulated in two hours.  Attention will be given to improved hypertension control. CARDIAC CATH: 06/30/2004  ANGIOGRAPHIC FINDINGS:  1.  The left main coronary artery is free of significant __________ coronary atherosclerosis.  2.  The left anterior descending is a medium caliber vessel with a large      bifurcating proximal diagonal branch and two smaller distal branches.      There is approximately 80% hypodense stenosis in the proximal left      anterior descending followed by 40% vessel stenosis, 30% distal stenosis and 40% apical stenosis. The large proximal bifurcating diagonal branch has a 50% ostial stenosis and 50% proximal stenosis but no major flow limiting disease.  3.  The circumflex coronary artery is a medium to large caliber vessel with four obtuse marginal branches. The first obtuse marginal branch has a 30% stenosis and there are other minor Luminal irregularities within the      circumflex proper including a 20% mid vessel stenosis.  4.  The right coronary artery is a dominant vessel that is medium in      caliber. There is 20% proximal vessel stenosis and  a 50% mid vessel      stenosis.  5.  Selective renal angiography was performed showing patent bilateral renal arteries without significant flow limiting obstruction.  6.  Left ventriculography is performed in the RAO projection revealing an  ejection fraction greater than 55% in the setting of ventricular ectopy      with 1+ mitral regurgitation. There also looks to be some degree of      apical hypokinesis.    DIAGNOSES:  1.  Single vessel obstructive coronary artery disease of the left anterior      descending in the proximal vessel presumably at or near the previous      arthrectomy site. The proximal diagonal branch has disease that is      nonobstructive. No other major flow limiting disease is noted in the      remaining coronaries.  2.  Patent bilateral renal arteries.  3.  __________ ejection fraction greater than 55% with apical hypokinesis      and 1+ mitral regurgitation in the setting of ectopy. Left ventricular      diastolic pressure is 16 mmHg.  4.  Systemic hypertension.  Patient Profile     76 y.o. female with complex PMH, including chronic kidney disease stage 4 (follows with [facility]/Dr. Hollie Salk), chronic diastolic heart failure, CAD with prior PCI, statin intolerance who presented with acute on chronic diastolic heart failure.  Assessment & Plan    Acute on chronic diastolic heart failure, with stage 4 CKD Complicated by hypokalemia -now on 80 mg IV lasix BID.  - no BMP checked today; ordering BMP for today and going forward - BNP for tomorrow   History of renal vein thrombosis and DVT -on long term apixaban: per primary team  CAD s/p prior PCI Elevated troponin (peak 20) Statin myalgia - demand ischemia presently asymptomatic -no aspirin- DOAC as above -would benefit from outpt lipid clinic to consider addition of [id number]    For questions or updates, please contact [facility] Please consult [email or web address] for contact info under      Signed, Werner Lean, MD  10/28/2021, 10:03 AM

## 2021-10-29 ENCOUNTER — Other Ambulatory Visit: Payer: Self-pay | Admitting: Physician Assistant

## 2021-10-29 DIAGNOSIS — N179 Acute kidney failure, unspecified: Secondary | ICD-10-CM | POA: Diagnosis not present

## 2021-10-29 DIAGNOSIS — F32A Depression, unspecified: Secondary | ICD-10-CM | POA: Diagnosis present

## 2021-10-29 DIAGNOSIS — I2510 Atherosclerotic heart disease of native coronary artery without angina pectoris: Secondary | ICD-10-CM | POA: Diagnosis not present

## 2021-10-29 DIAGNOSIS — E1169 Type 2 diabetes mellitus with other specified complication: Secondary | ICD-10-CM | POA: Diagnosis not present

## 2021-10-29 DIAGNOSIS — I5033 Acute on chronic diastolic (congestive) heart failure: Secondary | ICD-10-CM | POA: Diagnosis not present

## 2021-10-29 DIAGNOSIS — I5032 Chronic diastolic (congestive) heart failure: Secondary | ICD-10-CM

## 2021-10-29 LAB — BRAIN NATRIURETIC PEPTIDE: B Natriuretic Peptide: 81.8 pg/mL (ref 0.0–100.0)

## 2021-10-29 LAB — BASIC METABOLIC PANEL
Anion gap: 11 (ref 5–15)
BUN: 111 mg/dL — ABNORMAL HIGH (ref 8–23)
CO2: 30 mmol/L (ref 22–32)
Calcium: 9.1 mg/dL (ref 8.9–10.3)
Chloride: 101 mmol/L (ref 98–111)
Creatinine, Ser: 3.05 mg/dL — ABNORMAL HIGH (ref 0.44–1.00)
GFR, Estimated: 16 mL/min — ABNORMAL LOW (ref >60)
Glucose, Bld: 112 mg/dL — ABNORMAL HIGH (ref 70–99)
Potassium: 3.5 mmol/L (ref 3.5–5.1)
Sodium: 142 mmol/L (ref 135–145)

## 2021-10-29 LAB — GLUCOSE, CAPILLARY
Glucose-Capillary: 86 mg/dL (ref 70–99)
Glucose-Capillary: 117 mg/dL — ABNORMAL HIGH (ref 70–99)

## 2021-10-29 MED ORDER — POTASSIUM CHLORIDE 20 MEQ PO PACK
20.0000 meq | PACK | Freq: Every day | ORAL | Status: DC
  Administered 2021-10-29 – 2021-10-30 (×2): 20 meq via ORAL
  Filled 2021-10-29 (×2): qty 1

## 2021-10-29 MED ORDER — TORSEMIDE 20 MG PO TABS
40.0000 mg | ORAL_TABLET | Freq: Every day | ORAL | Status: DC
  Administered 2021-10-29 – 2021-10-30 (×2): 40 mg via ORAL
  Filled 2021-10-29 (×2): qty 2

## 2021-10-29 MED ORDER — AMLODIPINE BESYLATE 10 MG PO TABS
10.0000 mg | ORAL_TABLET | Freq: Every day | ORAL | Status: DC
  Administered 2021-10-29 – 2021-10-30 (×2): 10 mg via ORAL
  Filled 2021-10-29 (×2): qty 1

## 2021-10-29 MED ORDER — SODIUM CHLORIDE 0.9 % IV SOLN
510.0000 mg | Freq: Once | INTRAVENOUS | Status: AC
  Administered 2021-10-29: 510 mg via INTRAVENOUS
  Filled 2021-10-29: qty 17

## 2021-10-29 MED ORDER — PHENOL 1.4 % MT LIQD
1.0000 | OROMUCOSAL | Status: DC | PRN
  Administered 2021-10-29: 1 via OROMUCOSAL
  Filled 2021-10-29: qty 177

## 2021-10-29 NOTE — Progress Notes (Addendum)
Progress Note   Patient: [name] PJK:[id number] DOB: February 08, 1946 DOA: 10/23/2021     4 DOS: the patient was seen and examined on 10/29/2021   Brief hospital course: [name] was admitted to the hospital with the working diagnosis of decompensated heart failure.   76 yo female with the past medical history of CKD stage 4, diastolic heart failure, coronary artery disease, DVT, CVA, hypertension, T2DM, and chronic gastritis who presented with dyspnea. Recent hospitalization 08/3 to 08/4 for decompensated heart failure. At home patient had recurrent dyspnea and lower extremity edema, 911 was called and patient has brought back to the hospital. On her initial physical examination her blood pressure was 142/90, HR 93, RR 14 and 02 saturation 98%, lungs with bibasilar rales but not wheezing, heart with S1 and S2 present and rhythmic, abdomen not distended, positive lower extremity edema ++.   Na 142, K 4,3 CL 106 bicarbonate 20, glucose 126, bun 83 cr 3,36  BNP 352  High sensitive troponin 18 and 16  Wbc 14, hgb 9,3 plt 244   Chest radiograph with bilateral hilar vascular congestion, fluid in the right fissure, and cephalization of the vasculature.   EKG 91 bpm, normal axis, right bundle branch block, sinus rhythm with no significant ST segment or T wave changes.   Patient was placed on furosemide for diuresis.  Added intermittent metolazone.  Slow diuresis due to low GFR.   08/13 renal function is more stable at 3,0.  Transitioned to oral torsemide.   Assessment and Plan: * Acute on chronic heart failure with preserved ejection fraction (HFpEF) (HCC) Echocardiogram with preserved LV systolic function 60 to 80%, RV with preserved RV systolic function. Mild to moderate MR, mild to moderate TR.   Elevated troponin due to heart failure exacerbation.  Demand ischemia.   Patient continue with edema at her lower extremities.  Documented urine output 2,950 ml.  Systolic blood pressure  998PJ 170 mmHg.    08/10 and 08/11 metolazone 2,5 mg.  Transitioned to oral torsemide.  Continue with hydralazine for after load reduction and will add amlodipine for better blood pressure control.  Not able to use RAS inhibition due to low GFR.   At home she is not able to cook anymore and has been eating snacks, likely high salt content.    Acute kidney injury superimposed on chronic kidney disease ([place]) CKD stage IV with a base cr around 2,5  Hypertensive nephropathy.  Hypokalemia. Hypomagnesemia, hyponatremia.  US abdomen with bilateral simple renal cysts, with no hydronephrosis.  Urine analysis SG 1,009, negative protein, negative leukocytes.  Renal function with serum cr at 3,0, with K at 3,5 and serum bicarbonate at 30. Plan to continue diuresis with oral torsemide and add K supplements.  Will need close follow up as outpatient.  Continue blood pressure control with hydralazine and will add amlodipine.   Anemia of chronic disease with iron deficiency Iron 34, tibc 308, transferrin saturation 11 and ferritin 26.  Plan to add one dose of IV Iron today.   Coronary artery disease involving native coronary artery of native heart No clinical signs of acute coronary syndrome.   Hypertension associated with diabetes ([place]) Uncontrolled hypertension, will add amlodipine to hydralazine and continue close monitoring. No RAS inhibition due to reduced GFR.   Type 2 diabetes mellitus with hyperlipidemia (HCC) Fasting glucose this am 112 mg/dl  Plan to continue insulin sliding scale for glucose cover and monitoring.   Chronic gastritis Continue with PPI  Renal  vein thrombosis (HCC) Continue anticoagulation with apixaban.   Depression Continue with mirtazapine and fluoxetine.         Subjective: Patient with no chest pain or dyspnea, edema continue to improve,.   Physical Exam: Vitals:   10/28/21 1727 10/28/21 1921 10/29/21 0540 10/29/21 0716  BP: 125/60 (!) 138/56 (!)  171/72 (!) 165/67  Pulse:   80 80  Resp:  18 18 16   Temp:  98.9 F (37.2 C) 98.5 F (36.9 C)   TempSrc:  Oral Oral   SpO2:  98% 99% 95%  Weight:   62.2 kg   Height:       Neurology awake and alert ENT with mild pallor Cardiovascular with S1 and S2 present and rhythmic with no gallops or murmurs Respiratory with no rales or wheezing Abdomen not distended Positive lower extremity edema + bilaterally, unna boots in place.  Data Reviewed:    Family Communication: I spoke over the phone with the patient's daughter about patient's  condition, plan of care, prognosis and all questions were addressed.   Disposition: Status is: Inpatient Remains inpatient appropriate because: heart failure and renal failure, possible dc home tomorrow   Planned Discharge Destination: Home     Author: Tawni Millers, MD 10/29/2021 3:14 PM  For on call review [email or web address].

## 2021-10-29 NOTE — Assessment & Plan Note (Signed)
Continue with mirtazapine and fluoxetine.

## 2021-10-29 NOTE — Progress Notes (Signed)
Progress Note  Patient Name: [name] Date of Encounter: 10/29/2021  [facility] Cardiologist: [name], MD   Subjective   BNP has normalized. She was able to walk though the halls today without DOE.  No resting SOB  Inpatient Medications    Scheduled Meds:  apixaban  2.5 mg Oral BID   budesonide  3 mg Oral Daily   famotidine  20 mg Oral QHS   FLUoxetine  20 mg Oral Daily   furosemide  80 mg Intravenous BID   hydrALAZINE  25 mg Oral BID   lipase/protease/amylase  36,000 Units Oral TID WC   mirtazapine  7.5 mg Oral QHS   multivitamin with minerals  1 tablet Oral Daily   pantoprazole  40 mg Oral Daily   sodium chloride flush  3 mL Intravenous Q12H   Continuous Infusions:  PRN Meds: acetaminophen **OR** acetaminophen, bisacodyl, hydrALAZINE, loperamide, ondansetron **OR** ondansetron (ZOFRAN) IV, polyethylene glycol, traZODone   Vital Signs    Vitals:   10/28/21 1727 10/28/21 1921 10/29/21 0540 10/29/21 0716  BP: 125/60 (!) 138/56 (!) 171/72 (!) 165/67  Pulse:   80   Resp:  18 18 16   Temp:  98.9 F (37.2 C) 98.5 F (36.9 C)   TempSrc:  Oral Oral   SpO2:  98% 99% 95%  Weight:   62.2 kg   Height:        Intake/Output Summary (Last 24 hours) at 10/29/2021 0802 Last data filed at 10/29/2021 0800 Gross per 24 hour  Intake 400 ml  Output 3350 ml  Net -2950 ml      10/29/2021    5:40 AM 10/28/2021    5:04 AM 10/27/2021    6:50 AM  Last 3 Weights  Weight (lbs) 137 lb 3.2 oz 141 lb 6.4 oz 141 lb 12.1 oz  Weight (kg) 62.234 kg 64.139 kg 64.3 kg      Telemetry    SR - Personally Reviewed  Physical Exam   GEN: Well nourished, well developed in no acute distress HEENT: Normal NECK: No JVD; No carotid bruits LYMPHATICS: No lymphadenopathy CARDIAC:RRR, no murmurs, rubs, gallops RESPIRATORY:  Clear to auscultation without rales, wheezing or rhonchi  ABDOMEN: Soft, non-tender, non-distended MUSCULOSKELETAL:  Trace edema over wrapped legs SKIN:  Warm and dry NEUROLOGIC:  Alert and oriented x 3 PSYCHIATRIC:  Normal affect    Labs    High Sensitivity Troponin:   Recent Labs  Lab 10/19/21 1819 10/19/21 2009 10/23/21 2012 10/24/21 0214  TROPONINIHS 20* 20* 18* 16     Chemistry Recent Labs  Lab 10/23/21 2012 10/24/21 0735 10/25/21 0731 10/27/21 0407 10/28/21 1059 10/29/21 0247  NA 142 143   < > 134* 138 142  K 4.3 4.5   < > 3.7 3.8 3.5  CL 106 108   < > 97* 100 101  CO2 20* 24   < > 24 26 30   GLUCOSE 126* 88   < > 95 124* 112*  BUN 83* 82*   < > 92* 105* 111*  CREATININE 3.36* 2.99*   < > 2.80* 2.88* 3.05*  CALCIUM 10.0 9.2   < > 9.0 9.1 9.1  MG  --  1.7  --  2.0  --   --   PROT 7.7  --   --   --   --   --   ALBUMIN 3.9  --   --   --   --   --   AST 14*  --   --   --   --   --  ALT 12  --   --   --   --   --   ALKPHOS 96  --   --   --   --   --   BILITOT 0.3  --   --   --   --   --   GFRNONAA 14* 16*   < > 17* 17* 16*  ANIONGAP 16* 11   < > 13 12 11    < > = values in this interval not displayed.    Lipids No results for input(s): "CHOL", "TRIG", "HDL", "LABVLDL", "LDLCALC", "CHOLHDL" in the last 168 hours.  Hematology Recent Labs  Lab 10/24/21 0735 10/25/21 0731 10/26/21 0442  WBC 13.4* 9.9 8.6  RBC 3.33* 3.38* 3.02*  3.02*  HGB 8.8* 8.9* 8.2*  HCT 28.8* 29.0* 25.7*  MCV 86.5 85.8 85.1  MCH 26.4 26.3 27.2  MCHC 30.6 30.7 31.9  RDW 20.5* 20.1* 19.9*  PLT 224 193 202   Thyroid No results for input(s): "TSH", "FREET4" in the last 168 hours.  BNP Recent Labs  Lab 10/23/21 2012 10/29/21 0247  BNP 352.1* 81.8    DDimer No results for input(s): "DDIMER" in the last 168 hours.   Radiology    No results found.  Cardiac Studies   ECHO: 06/30/2020  1. Left ventricular ejection fraction, by estimation, is 55 to 60%. The  left ventricle has normal function. The left ventricle has no regional  wall motion abnormalities. There is mild left ventricular hypertrophy.  Left ventricular diastolic  parameters  are consistent with Grade I diastolic dysfunction (impaired relaxation).   2. Right ventricular systolic function is normal. The right ventricular  size is normal. There is mildly elevated pulmonary artery systolic  pressure. The estimated right ventricular systolic pressure is 50.0 mmHg.   3. Left atrial size was mildly dilated.   4. The mitral valve is normal in structure. Mild mitral valve  regurgitation. No evidence of mitral stenosis.   5. The aortic valve is tricuspid. Aortic valve regurgitation is not  visualized. No aortic stenosis is present.   6. The inferior vena cava is dilated in size with >50% respiratory  variability, suggesting right atrial pressure of 8 mmHg.    PCI/CARDIAC CATH: 06/30/2004 PCI:  IMPRESSION/PLAN:  Successful drug-eluting stent placement in the proximal  LAD.  The patient will be continued on Plavix for one year.  Aspirin will be continued indefinitely.  She will be ambulated in two hours.  Attention will be given to improved hypertension control. CARDIAC CATH: 06/30/2004  ANGIOGRAPHIC FINDINGS:  1.  The left main coronary artery is free of significant __________ coronary atherosclerosis.  2.  The left anterior descending is a medium caliber vessel with a large      bifurcating proximal diagonal branch and two smaller distal branches.      There is approximately 80% hypodense stenosis in the proximal left      anterior descending followed by 40% vessel stenosis, 30% distal stenosis and 40% apical stenosis. The large proximal bifurcating diagonal branch has a 50% ostial stenosis and 50% proximal stenosis but no major flow limiting disease.  3.  The circumflex coronary artery is a medium to large caliber vessel with four obtuse marginal branches. The first obtuse marginal branch has a 30% stenosis and there are other minor Luminal irregularities within the      circumflex proper including a 20% mid vessel stenosis.  4.  The right coronary artery is  a dominant vessel that  is medium in      caliber. There is 20% proximal vessel stenosis and a 50% mid vessel      stenosis.  5.  Selective renal angiography was performed showing patent bilateral renal arteries without significant flow limiting obstruction.  6.  Left ventriculography is performed in the RAO projection revealing an      ejection fraction greater than 55% in the setting of ventricular ectopy      with 1+ mitral regurgitation. There also looks to be some degree of      apical hypokinesis.    DIAGNOSES:  1.  Single vessel obstructive coronary artery disease of the left anterior      descending in the proximal vessel presumably at or near the previous      arthrectomy site. The proximal diagonal branch has disease that is      nonobstructive. No other major flow limiting disease is noted in the      remaining coronaries.  2.  Patent bilateral renal arteries.  3.  __________ ejection fraction greater than 55% with apical hypokinesis      and 1+ mitral regurgitation in the setting of ectopy. Left ventricular      diastolic pressure is 16 mmHg.  4.  Systemic hypertension.  Patient Profile     76 y.o. female with complex PMH, including chronic kidney disease stage 4 (follows with [facility]/Dr. Hollie Salk), chronic diastolic heart failure, CAD with prior PCI, statin intolerance who presented with acute on chronic diastolic heart failure.  Assessment & Plan    Acute on chronic diastolic heart failure, with stage 4 CKD Complicated by hypokalemia - will send home with torsemide 40 mg PO daily and Potassium of 20 meq Po daily - will need Bmp in one week    History of renal vein thrombosis and DVT -on long term apixaban: per primary team  CAD s/p prior PCI Elevated troponin (peak 20) Statin myalgia - demand ischemia presently asymptomatic -no aspirin- DOAC as above -would benefit from outpt lipid clinic to consider addition of [place] will sign off.    Medication Recommendations:  torsemide and potassium Other recommendations (labs, testing, etc):  BNP and BMP in 5-7 days Follow up as an outpatient:  Will arrange f/u with Dr. Johnsie Cancel or APP    For questions or updates, please contact [facility] Please consult [email or web address] for contact info under     Signed, Werner Lean, MD  10/29/2021, 8:02 AM

## 2021-10-29 NOTE — Evaluation (Signed)
Occupational Therapy Evaluation Patient Details Name: [name] MRN: [id number] DOB: [birth year] Today's Date: 10/29/2021   History of Present Illness Pt is a 76 y.o. female admitted 10/23/21 with continued SOB, BLE swelling and orthopnea. Of note, recent admission 8/3-10/20/21 with AKI and CHF, then d/c home. Other PMH includes CKD IV, HTN, CHF, CAD, DVT, CVA, DM2, gout, chronic gastritis, breast CA.   Clinical Impression   Pt independent at baseline with ADLs and functional mobility, lives with family who can assist at d/c, daughter currently assists with med mgmt and transportation needs. Pt currently mod I -minA for ADLs, mod I for bed mobility,and supervision for transfers without AD. Pt with mild unsteadiness, using railing /coutertops when walking in room/hall. Pt presenting with impairments listed below, will follow acutely. Recommend d/c home with family assistance.     Recommendations for follow up therapy are one component of a multi-disciplinary discharge planning process, led by the attending physician.  Recommendations may be updated based on patient status, additional functional criteria and insurance authorization.   Follow Up Recommendations  No OT follow up    Assistance Recommended at Discharge Set up Supervision/Assistance  Patient can return home with the following A little help with walking and/or transfers;A little help with bathing/dressing/bathroom;Assist for transportation;Help with stairs or ramp for entrance;Assistance with cooking/housework    Functional Status Assessment  Patient has had a recent decline in their functional status and demonstrates the ability to make significant improvements in function in a reasonable and predictable amount of time.  Equipment Recommendations  None recommended by OT    Recommendations for Other Services PT consult     Precautions / Restrictions Precautions Precautions: None Restrictions Weight Bearing Restrictions:  No      Mobility Bed Mobility Overal bed mobility: Modified Independent                  Transfers Overall transfer level: Needs assistance Equipment used: None Transfers: Sit to/from Stand Sit to Stand: Supervision                  Balance Overall balance assessment: Mild deficits observed, not formally tested                                         ADL either performed or assessed with clinical judgement   ADL Overall ADL's : Needs assistance/impaired Eating/Feeding: Modified independent   Grooming: Modified independent   Upper Body Bathing: Minimal assistance   Lower Body Bathing: Minimal assistance   Upper Body Dressing : Modified independent   Lower Body Dressing: Modified independent Lower Body Dressing Details (indicate cue type and reason): to don socks Toilet Transfer: Modified Independent   Toileting- Clothing Manipulation and Hygiene: Modified independent       Functional mobility during ADLs: Modified independent       Vision   Vision Assessment?: No apparent visual deficits     Perception     Praxis      Pertinent Vitals/Pain Pain Assessment Pain Assessment: No/denies pain Pain Intervention(s): Limited activity within patient's tolerance     Hand Dominance Right   Extremity/Trunk Assessment Upper Extremity Assessment Upper Extremity Assessment: LUE deficits/detail LUE Deficits / Details: unable to actively flex shoulder, reporting due to arthritis. WFL for ADL tasks   Lower Extremity Assessment Lower Extremity Assessment: Defer to PT evaluation   Cervical / Trunk Assessment Cervical /  Trunk Assessment: Normal   Communication Communication Communication: No difficulties   Cognition Arousal/Alertness: Awake/alert Behavior During Therapy: WFL for tasks assessed/performed, Flat affect Overall Cognitive Status: Within Functional Limits for tasks assessed                                        General Comments  VSS on RA    Exercises     Shoulder Instructions      Home Living Family/patient expects to be discharged to:: Private residence Living Arrangements: Children Available Help at Discharge: Family;Available PRN/intermittently Type of Home: House Home Access: Level entry     Home Layout: Two level;1/2 bath on main level (laundry upstairs) Alternate Level Stairs-Number of Steps: 12 Alternate Level Stairs-Rails: Can reach both Bathroom Shower/Tub: Teacher, early years/pre: Standard Bathroom Accessibility: Yes   Home Equipment: Conservation officer, nature (2 wheels);Rollator (4 wheels);Cane - single point          Prior Functioning/Environment Prior Level of Function : Independent/Modified Independent             Mobility Comments: ind without AD ADLs Comments: daughter assists with med mgmt and transportation        OT Problem List: Decreased strength;Decreased range of motion;Decreased activity tolerance      OT Treatment/Interventions: Energy conservation;Self-care/ADL training;Therapeutic exercise;Therapeutic activities;Patient/family education;DME and/or AE instruction    OT Goals(Current goals can be found in the care plan section) Acute Rehab OT Goals Patient Stated Goal: none stated OT Goal Formulation: With patient Time For Goal Achievement: 11/12/21 Potential to Achieve Goals: Good ADL Goals Pt Will Perform Tub/Shower Transfer: Independently;ambulating;Tub transfer;Shower transfer Additional ADL Goal #1: pt will verbalize 3 energy conservation strategies in prep for  ADLs  OT Frequency: Min 2X/week    Co-evaluation              AM-PAC OT "6 Clicks" Daily Activity     Outcome Measure Help from another person eating meals?: None Help from another person taking care of personal grooming?: None Help from another person toileting, which includes using toliet, bedpan, or urinal?: None Help from another person bathing (including  washing, rinsing, drying)?: A Little Help from another person to put on and taking off regular upper body clothing?: None Help from another person to put on and taking off regular lower body clothing?: None 6 Click Score: 23   End of Session Nurse Communication: Mobility status  Activity Tolerance: Patient tolerated treatment well Patient left: in chair;with call bell/phone within reach  OT Visit Diagnosis: Unsteadiness on feet (R26.81);Other abnormalities of gait and mobility (R26.89);Muscle weakness (generalized) (M62.81)                Time: 7425-9563 OT Time Calculation (min): 14 min Charges:  OT General Charges $OT Visit: 1 Visit OT Evaluation $OT Eval Low Complexity: 1 Low  Lynnda Child, OTD, OTR/L Acute Rehab [phone] - Edith Endave 10/29/2021, 10:21 AM

## 2021-10-30 ENCOUNTER — Telehealth: Payer: Self-pay

## 2021-10-30 ENCOUNTER — Encounter: Payer: Self-pay | Admitting: Hematology

## 2021-10-30 ENCOUNTER — Other Ambulatory Visit (HOSPITAL_COMMUNITY): Payer: Self-pay

## 2021-10-30 DIAGNOSIS — E1169 Type 2 diabetes mellitus with other specified complication: Secondary | ICD-10-CM | POA: Diagnosis not present

## 2021-10-30 DIAGNOSIS — I2510 Atherosclerotic heart disease of native coronary artery without angina pectoris: Secondary | ICD-10-CM | POA: Diagnosis not present

## 2021-10-30 DIAGNOSIS — N179 Acute kidney failure, unspecified: Secondary | ICD-10-CM | POA: Diagnosis not present

## 2021-10-30 DIAGNOSIS — F32A Depression, unspecified: Secondary | ICD-10-CM

## 2021-10-30 DIAGNOSIS — I5033 Acute on chronic diastolic (congestive) heart failure: Secondary | ICD-10-CM | POA: Diagnosis not present

## 2021-10-30 LAB — RENAL FUNCTION PANEL
Albumin: 2.9 g/dL — ABNORMAL LOW (ref 3.5–5.0)
Anion gap: 14 (ref 5–15)
BUN: 115 mg/dL — ABNORMAL HIGH (ref 8–23)
CO2: 29 mmol/L (ref 22–32)
Calcium: 9.4 mg/dL (ref 8.9–10.3)
Chloride: 95 mmol/L — ABNORMAL LOW (ref 98–111)
Creatinine, Ser: 3.33 mg/dL — ABNORMAL HIGH (ref 0.44–1.00)
GFR, Estimated: 14 mL/min — ABNORMAL LOW (ref >60)
Glucose, Bld: 87 mg/dL (ref 70–99)
Phosphorus: 5.2 mg/dL — ABNORMAL HIGH (ref 2.5–4.6)
Potassium: 3.3 mmol/L — ABNORMAL LOW (ref 3.5–5.1)
Sodium: 138 mmol/L (ref 135–145)

## 2021-10-30 LAB — GLUCOSE, CAPILLARY: Glucose-Capillary: 90 mg/dL (ref 70–99)

## 2021-10-30 MED ORDER — POTASSIUM CHLORIDE CRYS ER 20 MEQ PO TBCR: 20.0000 meq | EXTENDED_RELEASE_TABLET | Freq: Once | ORAL | Status: DC

## 2021-10-30 MED ORDER — AMLODIPINE BESYLATE 10 MG PO TABS
5.0000 mg | ORAL_TABLET | Freq: Every day | ORAL | 0 refills | Status: DC
  Filled 2021-10-30: qty 15, 30d supply, fill #0

## 2021-10-30 MED ORDER — APIXABAN 2.5 MG PO TABS
2.5000 mg | ORAL_TABLET | Freq: Two times a day (BID) | ORAL | 0 refills | Status: DC
  Filled 2021-10-30 – 2022-04-11 (×3): qty 60, 30d supply, fill #0

## 2021-10-30 MED ORDER — HYDRALAZINE HCL 25 MG PO TABS
25.0000 mg | ORAL_TABLET | Freq: Once | ORAL | Status: DC
Start: 2021-10-30 — End: 2021-10-30

## 2021-10-30 MED ORDER — HYDRALAZINE HCL 50 MG PO TABS: 50.0000 mg | ORAL_TABLET | Freq: Two times a day (BID) | ORAL | Status: DC

## 2021-10-30 MED ORDER — POTASSIUM CHLORIDE 20 MEQ PO PACK
20.0000 meq | PACK | Freq: Every day | ORAL | 0 refills | Status: DC
  Filled 2021-10-30: qty 30, 30d supply, fill #0

## 2021-10-30 MED ORDER — TORSEMIDE 20 MG PO TABS
40.0000 mg | ORAL_TABLET | Freq: Every day | ORAL | 0 refills | Status: DC
  Filled 2021-10-30: qty 60, 30d supply, fill #0

## 2021-10-30 NOTE — Discharge Summary (Addendum)
Physician Discharge Summary   Patient: [name] MRN: [id number] DOB: 20-Jul-1945  Admit date:     10/23/2021  Discharge date: 10/30/21  Discharge Physician: [name]   PCP: Katie Carol, MD   Recommendations at discharge:    Continue diuresis with oral torsemide 40 mg daily, continue K supplementation.  Follow up renal function in 5 to 7 days. Salt restricted diet.  Added amlodipine for blood pressure control Follow up with [name] in 7 to 10 days.  Follow up with nephrology Katie [name] and Cardiology Katie [name].   I spoke over the phone with the patient's daughter about patient's  condition, plan of care, prognosis and all questions were addressed.   Discharge Diagnoses: Principal Problem:   Acute on chronic heart failure with preserved ejection fraction (HFpEF) (HCC) Active Problems:   Acute kidney injury superimposed on chronic kidney disease ([place])   Coronary artery disease involving native coronary artery of native heart   Hypertension associated with diabetes ([place])   Type 2 diabetes mellitus with hyperlipidemia (HCC)   Chronic gastritis   Renal vein thrombosis ([place])   Depression  Resolved Problems:   * No resolved hospital problems. [facility] Course: [name] was admitted to the hospital with the working diagnosis of decompensated heart failure, in the setting of CKD stage IV.  76 yo female with the past medical history of CKD stage 4, diastolic heart failure, coronary artery disease, DVT, CVA, hypertension, T2DM, and chronic gastritis who presented with dyspnea. Recent hospitalization 08/3 to 08/4 for decompensated heart failure. At home patient had recurrent dyspnea and lower extremity edema, apparently she is not able to cook for herself anymore and has been eating salty snacks. Because persistent symptoms, 911 was called and patient has brought back to the hospital. On her initial physical examination her blood pressure was 142/90, HR 93, RR 14  and 02 saturation 98%, lungs with bibasilar rales but not wheezing, heart with S1 and S2 present and rhythmic, abdomen not distended, positive lower extremity edema ++.   Na 142, K 4,3 CL 106 bicarbonate 20, glucose 126, bun 83 cr 3,36  BNP 352  High sensitive troponin 18 and 16  Wbc 14, hgb 9,3 plt 244   Chest radiograph with bilateral hilar vascular congestion, fluid in the right fissure, and cephalization of the vasculature.   EKG 91 bpm, normal axis, right bundle branch block, sinus rhythm with no significant ST segment or T wave changes.   Patient was placed on furosemide for diuresis.  Added intermittent metolazone.  Slow diuresis due to low GFR.   08/13 renal function is more stable at 3,0.  Transitioned to oral torsemide.  Patient received IV iron for iron deficiency  08/14 renal function continue to be stable at 3.3. her baseline cr is 2,5 to 3,5  Plan to follow up as outpatient.   Assessment and Plan: * Acute on chronic heart failure with preserved ejection fraction (HFpEF) (HCC) Echocardiogram with preserved LV systolic function 60 to 25%, RV with preserved RV systolic function. Mild to moderate MR, mild to moderate TR.   Elevated troponin due to heart failure exacerbation.  Demand ischemia.   Patient was placed on IV furosemide and intermittent metolazone for diuresis, negative fluid balance was achieved, -5.936 with improvement in her symptoms.   Continue with hydralazine and amlodipine for better blood pressure control.  Not able to use RAS inhibition due to low GFR.   At home she is not able to  cook anymore and has been eating snacks, likely high salt content.  Patient will follow up as outpatient.    Acute kidney injury superimposed on chronic kidney disease ([place]) CKD stage IV with a base cr around 2,5 to 3.5  Hypertensive nephropathy.  Hypokalemia. Hypomagnesemia, hyponatremia.  US abdomen with bilateral simple renal cysts, with no hydronephrosis.  Urine  analysis SG 1,009, negative protein, negative leukocytes.   At the time of her discharge her serum cr is 3,3 with K at 3,3 and serum bicarbonate at 29. Patient will get extra 40 kcl before discharge and plan to follow up as outpatient.    Anemia of chronic disease with iron deficiency Iron 34, tibc 308, transferrin saturation 11 and ferritin 26.  SP IV iron.   Patient will follow up bpm as outpatient in 5 to 7 days.   Coronary artery disease involving native coronary artery of native heart No clinical signs of acute coronary syndrome.   Hypertension associated with diabetes ([place]) Uncontrolled hypertension,  Continue blood pressure control with amlodipine and hydralazine. No RAS inhibition due to reduced GFR.   Type 2 diabetes mellitus with hyperlipidemia (HCC) Her glucose remained stable during her hospitalization.   Chronic gastritis Continue with PPI  Renal vein thrombosis (HCC) Continue anticoagulation with apixaban.   Depression Continue with mirtazapine and fluoxetine.          Consultants: cardiology  Procedures performed: none   Disposition: Home Diet recommendation:  Cardiac diet DISCHARGE MEDICATION: Allergies as of 10/30/2021       Reactions   Percocet [oxycodone-acetaminophen] Other (See Comments)   headaches   Diazepam Other (See Comments)   Agitation   Haloperidol Lactate Nausea And Vomiting   Lorazepam Nausea Only   Morphine Sulfate Other (See Comments)   Agitation   Propoxyphene Hcl Nausea And Vomiting   Tramadol Hcl Nausea And Vomiting   Anesthetics, Halogenated Other (See Comments)   Per patient's daughter, she can not tolerate anesthetics.    Etomidate Other (See Comments)   Heart stopped during anesthesia   Fentanyl Other (See Comments)   Heart stopped during anesthesia   Haloperidol Nausea And Vomiting   Morphine Sulfate Nausea Only   Oxycodone-acetaminophen Other (See Comments)   headaches   Propoxyphene Nausea And Vomiting    Tramadol Hcl Nausea And Vomiting   Versed [midazolam] Other (See Comments)   Heart stopped during anesthesia        Medication List     STOP taking these medications    furosemide 20 MG tablet Commonly known as: LASIX       TAKE these medications    acetaminophen 500 MG tablet Commonly known as: TYLENOL Take 1,000 mg by mouth every 6 (six) hours as needed for moderate pain or headache.   amLODipine 10 MG tablet Commonly known as: NORVASC Take 0.5 tablets (5 mg total) by mouth daily. Start taking on: October 31, 2021   apixaban 2.5 MG Tabs tablet Commonly known as: Eliquis Take 1 tablet (2.5 mg total) by mouth 2 (two) times daily.   B-12 COMPLIANCE INJECTION IJ Inject 1 vial as directed every 30 (thirty) days.   budesonide 3 MG 24 hr capsule Commonly known as: ENTOCORT EC Take 1 capsule by mouth once daily   DRY EYES OP Place 1 drop into the right eye 2 (two) times daily as needed (dry eye).   ergocalciferol 1.25 MG (50000 UT) capsule Commonly known as: VITAMIN D2 Take 1 capsule (50,000 Units total) by mouth once a  week.   famotidine 20 MG tablet Commonly known as: PEPCID Take 20 mg by mouth at bedtime.   FLUoxetine 20 MG capsule Commonly known as: PROZAC Take 20 mg by mouth daily.   hydrALAZINE 100 MG tablet Commonly known as: APRESOLINE Take 0.5 tablets (50 mg total) by mouth 2 (two) times daily.   lipase/protease/amylase 36000 UNITS Cpep capsule Commonly known as: CREON Take 36,000 Units by mouth 3 (three) times daily with meals.   loperamide 2 MG tablet Commonly known as: IMODIUM A-D Take 4 mg by mouth 4 (four) times daily as needed for diarrhea or loose stools.   mirtazapine 7.5 MG tablet Commonly known as: REMERON Take 1 tablet (7.5 mg total) by mouth at bedtime.   nitroGLYCERIN 0.4 MG SL tablet Commonly known as: NITROSTAT DISSOLVE 1 TABLET UNDER THE TONGUE EVERY 5 MINUTES AS  NEEDED FOR CHEST PAIN. MAX  OF 3 TABLETS IN 15 MINUTES. CALL  911 IF PAIN PERSISTS. What changed: See the new instructions.   ondansetron 4 MG disintegrating tablet Commonly known as: ZOFRAN-ODT DISSOLVE 1 TABLET IN MOUTH EVERY 8 HOURS AS NEEDED FOR NAUSEA OR VOMITING What changed: See the new instructions.   pantoprazole 40 MG tablet Commonly known as: PROTONIX TAKE 1 TABLET BY MOUTH  DAILY BEFORE BREAKFAST What changed: when to take this   potassium chloride 20 MEQ packet Commonly known as: KLOR-CON Take 20 mEq by mouth daily. Start taking on: October 31, 2021   Torsemide 40 MG Tabs Take 40 mg by mouth daily. Start taking on: October 31, 2021        Follow-up Information     Katie Carol, MD. Go on 11/02/2021.   Specialty: Internal Medicine Why: @1 :45pm Contact information: [address] [place] [address] [phone]         Josue Hector, MD .   Specialty: Cardiology Contact information: [phone] [place] [address] [phone]                Discharge Exam: Danley Danker Weights   10/28/21 0504 10/29/21 0540 10/30/21 0537  Weight: 64.1 kg 62.2 kg 60.5 kg   BP (!) 163/72 (BP Location: Left Arm)   Pulse 73   Temp 97.7 F (36.5 C) (Oral)   Resp 16   Ht 5' 4"  (1.626 m)   Wt 60.5 kg   SpO2 98%   BMI 22.88 kg/m   Patient is feeling better, her dyspnea and edema have resolved.   Neurology awake and alert ENT with no pallor Cardiovascular with S1 and S2 present and rhythmic with no gallops, rubs or murmurs Respiratory with  no rales or wheezing Abdomen with no distention   Condition at discharge: stable  The results of significant diagnostics from this hospitalization (including imaging, microbiology, ancillary and laboratory) are listed below for reference.   Imaging Studies: ECHOCARDIOGRAM COMPLETE  Result Date: 10/24/2021    ECHOCARDIOGRAM REPORT   Patient Name:   Jeffifer [name] Mcconaughey Date of Exam: 10/24/2021 Medical Rec #:  [id number]          Height:        64.0 in Accession #:    [id number]         Weight:       150.2 lb Date of Birth:  [birth year]          BSA:          1.732 m Patient Age:    89 years  BP:           135/61 mmHg Patient Gender: F                  HR:           87 bpm. Exam Location:  Inpatient Procedure: 2D Echo, Cardiac Doppler and Color Doppler Indications:    CHF  History:        Patient has prior history of Echocardiogram examinations, most                 recent 06/30/2020. CHF, CAD; Risk Factors:Diabetes, Hypertension                 and Dyslipidemia.  Sonographer:    Jefferey Pica Referring Phys: [place]  1. Left ventricular ejection fraction, by estimation, is 60 to 65%. The left ventricle has normal function. The left ventricle has no regional wall motion abnormalities. Left ventricular diastolic parameters are indeterminate.  2. Right ventricular systolic function is normal. The right ventricular size is normal. There is moderately elevated pulmonary artery systolic pressure.  3. Left atrial size was mildly dilated.  4. The mitral valve is normal in structure. Mild to moderate mitral valve regurgitation.  5. Tricuspid valve regurgitation is mild to moderate.  6. The aortic valve is tricuspid. Aortic valve regurgitation is not visualized. No aortic stenosis is present.  7. The inferior vena cava is dilated in size with <50% respiratory variability, suggesting right atrial pressure of 15 mmHg. Comparison(s): Prior images reviewed side by side. The left ventricular function is unchanged. There is further increase in estimated right atrial and pulmonary artery pressure. Left ventricular diastolic parameters and mitral insufficiency appear largely unchanged. FINDINGS  Left Ventricle: Left ventricular ejection fraction, by estimation, is 60 to 65%. The left ventricle has normal function. The left ventricle has no regional wall motion abnormalities. The left ventricular internal cavity size was normal in size.  There is  no left ventricular hypertrophy. Left ventricular diastolic parameters are indeterminate. Indeterminate filling pressures. Right Ventricle: The right ventricular size is normal. No increase in right ventricular wall thickness. Right ventricular systolic function is normal. There is moderately elevated pulmonary artery systolic pressure. The tricuspid regurgitant velocity is 3.06 m/s, and with an assumed right atrial pressure of 15 mmHg, the estimated right ventricular systolic pressure is 28.4 mmHg. Left Atrium: Left atrial size was mildly dilated. Right Atrium: Right atrial size was normal in size. Pericardium: There is no evidence of pericardial effusion. Mitral Valve: The mitral valve is normal in structure. Mild to moderate mitral valve regurgitation. Tricuspid Valve: The tricuspid valve is normal in structure. Tricuspid valve regurgitation is mild to moderate. Aortic Valve: The aortic valve is tricuspid. Aortic valve regurgitation is not visualized. No aortic stenosis is present. Aortic valve peak gradient measures 8.3 mmHg. Pulmonic Valve: The pulmonic valve was normal in structure. Pulmonic valve regurgitation is trivial. Aorta: The aortic root and ascending aorta are structurally normal, with no evidence of dilitation. Venous: The inferior vena cava is dilated in size with less than 50% respiratory variability, suggesting right atrial pressure of 15 mmHg. IAS/Shunts: No atrial level shunt detected by color flow Doppler.  LEFT VENTRICLE PLAX 2D LVIDd:         4.20 cm   Diastology LVIDs:         2.20 cm   LV e' medial:    5.57 cm/s LV PW:         1.10 cm  LV E/e' medial:  16.6 LV IVS:        1.10 cm   LV e' lateral:   7.51 cm/s LVOT diam:     1.90 cm   LV E/e' lateral: 12.3 LV SV:         63 LV SV Index:   37 LVOT Area:     2.84 cm  RIGHT VENTRICLE             IVC RV Basal diam:  2.60 cm     IVC diam: 2.60 cm RV S prime:     13.30 cm/s TAPSE (M-mode): 3.0 cm LEFT ATRIUM             Index         RIGHT ATRIUM           Index LA diam:        4.00 cm 2.31 cm/m   RA Area:     10.70 cm LA Vol (A2C):   59.7 ml 34.46 ml/m  RA Volume:   24.20 ml  13.97 ml/m LA Vol (A4C):   52.1 ml 30.08 ml/m LA Biplane Vol: 58.3 ml 33.66 ml/m  AORTIC VALVE                 PULMONIC VALVE AV Area (Vmax): 1.85 cm     PV Vmax:       0.66 m/s AV Vmax:        144.00 cm/s  PV Peak grad:  1.8 mmHg AV Peak Grad:   8.3 mmHg LVOT Vmax:      94.20 cm/s LVOT Vmean:     57.600 cm/s LVOT VTI:       0.223 m  AORTA Ao Root diam: 3.00 cm Ao Asc diam:  3.10 cm MITRAL VALVE               TRICUSPID VALVE MV Area (PHT): 5.55 cm    TR Peak grad:   37.5 mmHg MV Decel Time: 137 msec    TR Vmax:        306.00 cm/s MV E velocity: 92.42 cm/s MV A velocity: 93.53 cm/s  SHUNTS MV E/A ratio:  0.99        Systemic VTI:  0.22 m                            Systemic Diam: 1.90 cm Dani Gobble Croitoru MD Electronically signed by Sanda Klein MD Signature Date/Time: 10/24/2021/3:32:23 PM    Final    DG Chest 2 View  Result Date: 10/23/2021 CLINICAL DATA:  Shortness of breath.  Lower extremity edema. EXAM: CHEST - 2 VIEW COMPARISON:  10/19/2021 FINDINGS: Cardiomegaly. Aortic tortuosity. Coronary artery stent. Pulmonary venous hypertension with mild edema. Very small amount of pleural fluid. No lobar consolidation or collapse. IMPRESSION: Pulmonary venous hypertension with mild edema consistent with fluid overload/congestive heart failure. Electronically Signed   By: Nelson Chimes M.D.   On: 10/23/2021 20:45   US Venous Img Lower Bilateral (DVT)  Result Date: 10/19/2021 CLINICAL DATA:  Pain and swelling, prior history of DVT EXAM: BILATERAL LOWER EXTREMITY VENOUS DOPPLER ULTRASOUND TECHNIQUE: Gray-scale sonography with graded compression, as well as color Doppler and duplex ultrasound were performed to evaluate the lower extremity deep venous systems from the level of the common femoral vein and including the common femoral, femoral, profunda femoral, popliteal  and calf veins including the posterior tibial, peroneal and gastrocnemius veins when visible. The  superficial great saphenous vein was also interrogated. Spectral Doppler was utilized to evaluate flow at rest and with distal augmentation maneuvers in the common femoral, femoral and popliteal veins. COMPARISON:  None Available. FINDINGS: RIGHT LOWER EXTREMITY Common Femoral Vein: No evidence of thrombus. Normal compressibility, respiratory phasicity and response to augmentation. Saphenofemoral Junction: No evidence of thrombus. Normal compressibility and flow on color Doppler imaging. Profunda Femoral Vein: No evidence of thrombus. Normal compressibility and flow on color Doppler imaging. Femoral Vein: No evidence of thrombus. Normal compressibility, respiratory phasicity and response to augmentation. Popliteal Vein: No evidence of thrombus. Normal compressibility, respiratory phasicity and response to augmentation. Calf Veins: No evidence of thrombus. Normal compressibility and flow on color Doppler imaging. Superficial Great Saphenous Vein: No evidence of thrombus. Normal compressibility. Venous Reflux:  None. Other Findings:  There is edema in subcutaneous plane. LEFT LOWER EXTREMITY Common Femoral Vein: No evidence of thrombus. Normal compressibility, respiratory phasicity and response to augmentation. Saphenofemoral Junction: No evidence of thrombus. Normal compressibility and flow on color Doppler imaging. Profunda Femoral Vein: No evidence of thrombus. Normal compressibility and flow on color Doppler imaging. Femoral Vein: No evidence of thrombus. Normal compressibility, respiratory phasicity and response to augmentation. Popliteal Vein: No evidence of thrombus. Normal compressibility, respiratory phasicity and response to augmentation. Calf Veins: No evidence of thrombus. Normal compressibility and flow on color Doppler imaging. Superficial Great Saphenous Vein: No evidence of thrombus. Normal  compressibility. Venous Reflux:  None. Other Findings:  There is edema in subcutaneous plane. IMPRESSION: No evidence of deep venous thrombosis in either lower extremity. Electronically Signed   By: Elmer Picker M.D.   On: 10/19/2021 19:20   DG Chest Portable 1 View  Result Date: 10/19/2021 CLINICAL DATA:  Two weeks of swelling of both legs. EXAM: PORTABLE CHEST 1 VIEW COMPARISON:  April 16, 2021 FINDINGS: The heart size and mediastinal contours are within normal limits. Low lung volumes are noted. Both lungs are clear. Radiopaque surgical clips are seen along the right axilla and within the right upper quadrant. Multilevel degenerative changes are seen throughout the thoracic spine. IMPRESSION: Low lung volumes without evidence of acute or active cardiopulmonary disease. Electronically Signed   By: Virgina Norfolk M.D.   On: 10/19/2021 18:56    Microbiology: Results for orders placed or performed during the hospital encounter of 04/16/21  Resp Panel by RT-PCR (Flu A&B, Covid) Nasopharyngeal Swab     Status: None   Collection Time: 04/16/21  4:45 PM   Specimen: Nasopharyngeal Swab; Nasopharyngeal(NP) swabs in vial transport medium  Result Value Ref Range Status   SARS Coronavirus 2 by RT PCR NEGATIVE NEGATIVE Final    Comment: (NOTE) SARS-CoV-2 target nucleic acids are NOT DETECTED.  The SARS-CoV-2 RNA is generally detectable in upper respiratory specimens during the acute phase of infection. The lowest concentration of SARS-CoV-2 viral copies this assay can detect is 138 copies/mL. A negative result does not preclude SARS-Cov-2 infection and should not be used as the sole basis for treatment or other patient management decisions. A negative result may occur with  improper specimen collection/handling, submission of specimen other than nasopharyngeal swab, presence of viral mutation(s) within the areas targeted by this assay, and inadequate number of viral copies(<138 copies/mL). A  negative result must be combined with clinical observations, patient history, and epidemiological information. The expected result is Negative.  Fact Sheet for Patients:  EntrepreneurPulse.com.au  Fact Sheet for Healthcare Providers:  IncredibleEmployment.be  This test is no t yet approved or cleared  by the [place] and  has been authorized for detection and/or diagnosis of SARS-CoV-2 by FDA under an Emergency Use Authorization (EUA). This EUA will remain  in effect (meaning this test can be used) for the duration of the COVID-19 declaration under Section 564(b)(1) of the Act, 21 U.S.C.section 360bbb-3(b)(1), unless the authorization is terminated  or revoked sooner.       Influenza A by PCR NEGATIVE NEGATIVE Final   Influenza B by PCR NEGATIVE NEGATIVE Final    Comment: (NOTE) The Xpert Xpress SARS-CoV-2/FLU/RSV plus assay is intended as an aid in the diagnosis of influenza from Nasopharyngeal swab specimens and should not be used as a sole basis for treatment. Nasal washings and aspirates are unacceptable for Xpert Xpress SARS-CoV-2/FLU/RSV testing.  Fact Sheet for Patients: EntrepreneurPulse.com.au  Fact Sheet for Healthcare Providers: IncredibleEmployment.be  This test is not yet approved or cleared by the [place] FDA and has been authorized for detection and/or diagnosis of SARS-CoV-2 by FDA under an Emergency Use Authorization (EUA). This EUA will remain in effect (meaning this test can be used) for the duration of the COVID-19 declaration under Section 564(b)(1) of the Act, 21 U.S.C. section 360bbb-3(b)(1), unless the authorization is terminated or revoked.  Performed at [facility], [place] [address] [place], [place] [address]    *Note: Due to a large number of results and/or encounters for the requested time period, some results have not been displayed. A complete set of  results can be found in Results Review.    Labs: CBC: Recent Labs  Lab 10/23/21 2012 10/24/21 0735 10/25/21 0731 10/26/21 0442  WBC 14.0* 13.4* 9.9 8.6  NEUTROABS 11.2*  --   --   --   HGB 9.3* 8.8* 8.9* 8.2*  HCT 30.1* 28.8* 29.0* 25.7*  MCV 87.0 86.5 85.8 85.1  PLT 244 224 193 604   Basic Metabolic Panel: Recent Labs  Lab 10/24/21 0735 10/25/21 0731 10/26/21 0442 10/27/21 0407 10/28/21 1059 10/29/21 0247 10/30/21 0621  NA 143   < > 139 134* 138 142 138  K 4.5   < > 3.3* 3.7 3.8 3.5 3.3*  CL 108   < > 103 97* 100 101 95*  CO2 24   < > 26 24 26 30 29   GLUCOSE 88   < > 94 95 124* 112* 87  BUN 82*   < > 89* 92* 105* 111* 115*  CREATININE 2.99*   < > 2.99* 2.80* 2.88* 3.05* 3.33*  CALCIUM 9.2   < > 8.8* 9.0 9.1 9.1 9.4  MG 1.7  --   --  2.0  --   --   --   PHOS  --   --   --   --   --   --  5.2*   < > = values in this interval not displayed.   Liver Function Tests: Recent Labs  Lab 10/23/21 2012 10/30/21 0621  AST 14*  --   ALT 12  --   ALKPHOS 96  --   BILITOT 0.3  --   PROT 7.7  --   ALBUMIN 3.9 2.9*   CBG: Recent Labs  Lab 10/28/21 1608 10/28/21 2035 10/29/21 0547 10/29/21 1120 10/30/21 0614  GLUCAP 191* 105* 86 117* 90    Discharge time spent: greater than 30 minutes.  Signed: Tawni Millers, MD [facility] 10/30/2021

## 2021-10-30 NOTE — TOC Initial Note (Signed)
Transition of Care [facility]) - Initial/Assessment Note    Patient Details  Name: [name] MRN: [id number] Date of Birth: 01/06/1946  Transition of Care [facility]) CM/SW Contact:    Zenon Mayo, RN Phone Number: 10/30/2021, 10:28 AM  Clinical Narrative:                 Patient is for dc today, NCM asked her is she would like to be set up with outpatient Physical therapy, she states yes.  She would like to go to the Outpatient Rehab on [facility].  NCM made referral thru epic. She states she has transportation home today.  Expected Discharge Plan: Home/Self Care Barriers to Discharge: No Barriers Identified   Patient Goals and CMS Choice Patient states their goals for this hospitalization and ongoing recovery are:: return home   Choice offered to / list presented to : NA  Expected Discharge Plan and Services Expected Discharge Plan: Home/Self Care   Discharge Planning Services: CM Consult Post Acute Care Choice: NA Living arrangements for the past 2 months: Single Family Home Expected Discharge Date: 10/30/21                 DME Agency: NA       HH Arranged: NA          Prior Living Arrangements/Services Living arrangements for the past 2 months: Single Family Home Lives with:: Relatives Patient language and need for interpreter reviewed:: Yes Do you feel safe going back to the place where you live?: Yes      Need for Family Participation in Patient Care: Yes (Comment) Care giver support system in place?: Yes (comment) Current home services:  (walker and cane) Criminal Activity/Legal Involvement Pertinent to Current Situation/Hospitalization: No - Comment as needed  Activities of Daily Living Home Assistive Devices/Equipment: None ADL Screening (condition at time of admission) Patient's cognitive ability adequate to safely complete daily activities?: Yes Is the patient deaf or have difficulty hearing?: No Does the patient have difficulty seeing, even  when wearing glasses/contacts?: No Does the patient have difficulty concentrating, remembering, or making decisions?: Yes Patient able to express need for assistance with ADLs?: Yes Does the patient have difficulty dressing or bathing?: Yes Independently performs ADLs?: Yes (appropriate for developmental age) Does the patient have difficulty walking or climbing stairs?: Yes Weakness of Legs: Both Weakness of Arms/Hands: Both  Permission Sought/Granted                  Emotional Assessment Appearance:: Appears stated age Attitude/Demeanor/Rapport: Engaged Affect (typically observed): Appropriate Orientation: : Oriented to Self, Oriented to Place, Oriented to  Time, Oriented to Situation Alcohol / Substance Use: Not Applicable Psych Involvement: No (comment)  Admission diagnosis:  Acute on chronic heart failure with preserved ejection fraction (HFpEF) ([place]) [I50.33] Patient Active Problem List   Diagnosis Date Noted   Depression 10/29/2021   Acute on chronic heart failure with preserved ejection fraction (HFpEF) ([place]) 10/24/2021   Chronic gastritis 10/19/2021   AKI (acute kidney injury) ([place]) 04/17/2021   Generalized weakness 12/02/2020   (HFpEF) heart failure with preserved ejection fraction ([place]) 12/02/2020   Acute renal failure (ARF) ([place]) 12/02/2020   Elevated troponin 11/15/2020   Acute kidney injury superimposed on chronic kidney disease ([place]) 11/15/2020   Unintentional weight loss 11/15/2020   Malnutrition of moderate degree 09/27/2020   Transaminitis 09/23/2020   Acute renal failure superimposed on stage 4 chronic kidney disease ([place]) 09/03/2020   Chronic diastolic CHF (congestive heart  failure) ([place]) 09/03/2020   Dyspnea 06/28/2020   Chronic diarrhea - responds to budesonide 09/10/2019   Iron deficiency anemia 08/19/2018   Spondylosis of lumbar spine 12/24/2017   Renal vein thrombosis ([place]) 09/17/2017   Hoarse voice quality 09/09/2017   Low back pain without  sciatica 09/09/2017   Leukocytosis    Hypertension associated with diabetes ([place])    Urinary frequency    Type 2 diabetes mellitus with hyperlipidemia ([place])    Benign essential HTN    Dysphagia    Debility    Encephalopathy    Pressure injury of skin 08/04/2017   Diffuse pulmonary alveolar hemorrhage    SOB (shortness of breath) 10/11/2016   Hyperparathyroidism, secondary renal ([place]) 05/02/2016   Gout 04/11/2015   Diabetic neuropathy associated with type 2 diabetes mellitus ([place]) 01/06/2015   SBO (small bowel obstruction) ([place]) 09/15/2013   Breast cancer of lower-inner quadrant of right female breast ([place]) 03/18/2013   Chronic venous insufficiency of lower extremity 01/06/2013   Health care maintenance 05/08/2011   Seasonal allergies 05/03/2010   Labile hypertension    Coronary artery disease involving native coronary artery of native heart    Abdominal discomfort    Anemia in chronic kidney disease    History of stroke without residual deficits    Chronic pain    Adnexal mass    GERD without esophagitis 01/19/2009   Spinal stenosis, lumbar region, with neurogenic claudication 01/03/2009   PCP:  Seward Carol, MD Pharmacy:   [facility] ([place], [place] - [address] [facility] [facility] [address] [place] [address] Phone: [phone] Fax: [phone]  [place] ([place]), [place] - [address] [place] [address] Phone: [phone] Fax: [place] [address] ([address]), [place] - [place] [address] [place] ([place] [address] Phone: [phone] Fax: [phone]  [facility] ([facility]) - [place], [place] [address] Phone: [phone] Fax: [phone]  [facility] [address]  [address] Phone: [phone] Fax: [phone]     Social Determinants of Health (SDOH) Interventions Food Insecurity Interventions: Intervention Not Indicated Financial Strain Interventions: Intervention Not Indicated Housing Interventions: Intervention Not Indicated Transportation Interventions: Intervention Not Indicated  Readmission Risk Interventions    10/30/2021   10:20 AM 09/04/2020   12:47 PM  Readmission Risk Prevention Plan  Transportation Screening Complete Complete  Medication Review Press photographer) Complete Complete  PCP or Specialist appointment within 3-5 days of discharge Complete   HRI or [place] Complete Complete  SW Recovery Care/Counseling Consult Complete Complete  Palliative Care Screening Not Applicable Not [place] Not Applicable Not Applicable

## 2021-10-30 NOTE — Progress Notes (Signed)
Spoke to daughter [name] in regards to the discharge. She states patients son Jiles Prows will take patient home after work at [employer]. Pt will be going to discharge lounge, Seth Bake, [place] made aware of patients discharge plan and Tyr[place]'s phone number.

## 2021-10-30 NOTE — Progress Notes (Signed)
Patient given discharge instructions and stated understanding., TOC medications have been delivered.

## 2021-10-30 NOTE — Telephone Encounter (Signed)
**Note De-Identified  Obfuscation** I s/w the pts daughter and DPR, Hattie Perch who advised me that the pt is currently in the discharge process at [facility] and requested that we talk at a later time.  I advised her that we will call them back tomorrow to do this TCM Call.  [name] states that it is okay to call the pt but that sometimes her Mom gets confused so she requested [facility] phone number so she [name]) can call us back to do this TCM call tomorrow.  I provided her our phone number and advised her to ask to s/w a Triage nurse.  She thanked me for my call.

## 2021-10-30 NOTE — Telephone Encounter (Signed)
**Note De-identified  Obfuscation** -----  **Note De-Identified  Obfuscation** Message from Liliane Shi, [place] sent at 10/29/2021  9:59 AM EDT ----- Regarding: [facility] F/U Primary Cardiologist:  Johnsie Cancel DC from hospital on 10/29/2021  Please arrange FU in 1 week with Dr. Johnsie Cancel or APP. This is a TCM appointment. The patient needs a BMET, BNP in 1 week as well. Order is in [place]. Please arrange.  Signed,  Richardson Dopp, PA-C   10/29/2021 9:59 AM

## 2021-10-30 NOTE — Discharge Instructions (Signed)

## 2021-10-30 NOTE — TOC Transition Note (Signed)
Transition of Care [facility]) - CM/SW Discharge Note   Patient Details  Name: [name] MRN: [id number] Date of Birth: 07/16/1945  Transition of Care [facility]) CM/SW Contact:  Zenon Mayo, RN Phone Number: 10/30/2021, 10:31 AM   Clinical Narrative:    She is for dc today, she is set up with OP physical therapy.    Final next level of care: Home/Self Care Barriers to Discharge: No Barriers Identified   Patient Goals and CMS Choice Patient states their goals for this hospitalization and ongoing recovery are:: return home   Choice offered to / list presented to : NA  Discharge Placement                       Discharge Plan and Services   Discharge Planning Services: CM Consult Post Acute Care Choice: NA            DME Agency: NA       HH Arranged: NA          Social Determinants of Health (SDOH) Interventions Food Insecurity Interventions: Intervention Not Indicated Financial Strain Interventions: Intervention Not Indicated Housing Interventions: Intervention Not Indicated Transportation Interventions: Intervention Not Indicated   Readmission Risk Interventions    10/30/2021   10:20 AM 09/04/2020   12:47 PM  Readmission Risk Prevention Plan  Transportation Screening Complete Complete  Medication Review Press photographer) Complete Complete  PCP or Specialist appointment within 3-5 days of discharge Complete   HRI or [place] Complete Complete  SW Recovery Care/Counseling Consult Complete Complete  Palliative Care Screening Not Applicable Not [place] Not Applicable Not Applicable

## 2021-10-31 NOTE — Telephone Encounter (Signed)
Transition Care Management Unsuccessful Follow-up Telephone Call  Date of discharge and from where:  10/30/21 from [facility]  Attempts:  1st Attempt  Reason for unsuccessful TCM follow-up call:  Left voice message

## 2021-11-01 NOTE — Telephone Encounter (Signed)
Transition Care Management Unsuccessful Follow-up Telephone Call  Date of discharge and from where:  10/30/21 from [facility]  Attempts:  2nd Attempt  Reason for unsuccessful TCM follow-up call:  Left voice message

## 2021-11-02 DIAGNOSIS — I10 Essential (primary) hypertension: Secondary | ICD-10-CM | POA: Diagnosis not present

## 2021-11-02 DIAGNOSIS — N179 Acute kidney failure, unspecified: Secondary | ICD-10-CM | POA: Diagnosis not present

## 2021-11-02 DIAGNOSIS — N184 Chronic kidney disease, stage 4 (severe): Secondary | ICD-10-CM | POA: Diagnosis not present

## 2021-11-02 DIAGNOSIS — R413 Other amnesia: Secondary | ICD-10-CM | POA: Diagnosis not present

## 2021-11-03 NOTE — Telephone Encounter (Signed)
Transition Care Management Unsuccessful Follow-up Telephone Call  Date of discharge and from where:  10/30/21 from [facility]  Attempts:  3rd Attempt  Reason for unsuccessful TCM follow-up call:  Left voice message

## 2021-11-05 ENCOUNTER — Encounter (HOSPITAL_COMMUNITY): Payer: Self-pay

## 2021-11-05 ENCOUNTER — Other Ambulatory Visit: Payer: Self-pay

## 2021-11-05 ENCOUNTER — Inpatient Hospital Stay (HOSPITAL_COMMUNITY)
Admission: EM | Admit: 2021-11-05 | Discharge: 2021-11-08 | DRG: 683 | Disposition: A | Discharge status: Home Health | Payer: Medicare Other | Attending: Internal Medicine | Admitting: Internal Medicine

## 2021-11-05 ENCOUNTER — Emergency Department (HOSPITAL_COMMUNITY): Payer: Medicare Other

## 2021-11-05 DIAGNOSIS — R531 Weakness: Secondary | ICD-10-CM

## 2021-11-05 DIAGNOSIS — Z8673 Personal history of transient ischemic attack (TIA), and cerebral infarction without residual deficits: Secondary | ICD-10-CM

## 2021-11-05 DIAGNOSIS — Z86718 Personal history of other venous thrombosis and embolism: Secondary | ICD-10-CM | POA: Diagnosis not present

## 2021-11-05 DIAGNOSIS — E1122 Type 2 diabetes mellitus with diabetic chronic kidney disease: Secondary | ICD-10-CM | POA: Diagnosis present

## 2021-11-05 DIAGNOSIS — I130 Hypertensive heart and chronic kidney disease with heart failure and stage 1 through stage 4 chronic kidney disease, or unspecified chronic kidney disease: Secondary | ICD-10-CM | POA: Diagnosis present

## 2021-11-05 DIAGNOSIS — Z8616 Personal history of COVID-19: Secondary | ICD-10-CM | POA: Diagnosis not present

## 2021-11-05 DIAGNOSIS — N179 Acute kidney failure, unspecified: Secondary | ICD-10-CM | POA: Diagnosis not present

## 2021-11-05 DIAGNOSIS — N184 Chronic kidney disease, stage 4 (severe): Secondary | ICD-10-CM | POA: Diagnosis present

## 2021-11-05 DIAGNOSIS — E785 Hyperlipidemia, unspecified: Secondary | ICD-10-CM | POA: Diagnosis present

## 2021-11-05 DIAGNOSIS — Z884 Allergy status to anesthetic agent status: Secondary | ICD-10-CM

## 2021-11-05 DIAGNOSIS — Z888 Allergy status to other drugs, medicaments and biological substances status: Secondary | ICD-10-CM | POA: Diagnosis not present

## 2021-11-05 DIAGNOSIS — D72829 Elevated white blood cell count, unspecified: Secondary | ICD-10-CM | POA: Diagnosis present

## 2021-11-05 DIAGNOSIS — M109 Gout, unspecified: Secondary | ICD-10-CM | POA: Diagnosis present

## 2021-11-05 DIAGNOSIS — R112 Nausea with vomiting, unspecified: Secondary | ICD-10-CM

## 2021-11-05 DIAGNOSIS — Z862 Personal history of diseases of the blood and blood-forming organs and certain disorders involving the immune mechanism: Secondary | ICD-10-CM | POA: Diagnosis present

## 2021-11-05 DIAGNOSIS — N3000 Acute cystitis without hematuria: Secondary | ICD-10-CM

## 2021-11-05 DIAGNOSIS — D631 Anemia in chronic kidney disease: Secondary | ICD-10-CM | POA: Diagnosis present

## 2021-11-05 DIAGNOSIS — Z923 Personal history of irradiation: Secondary | ICD-10-CM

## 2021-11-05 DIAGNOSIS — I10 Essential (primary) hypertension: Secondary | ICD-10-CM | POA: Diagnosis not present

## 2021-11-05 DIAGNOSIS — G8929 Other chronic pain: Secondary | ICD-10-CM | POA: Diagnosis present

## 2021-11-05 DIAGNOSIS — N189 Chronic kidney disease, unspecified: Secondary | ICD-10-CM | POA: Diagnosis present

## 2021-11-05 DIAGNOSIS — E1169 Type 2 diabetes mellitus with other specified complication: Secondary | ICD-10-CM | POA: Diagnosis present

## 2021-11-05 DIAGNOSIS — K219 Gastro-esophageal reflux disease without esophagitis: Secondary | ICD-10-CM | POA: Diagnosis present

## 2021-11-05 DIAGNOSIS — K2950 Unspecified chronic gastritis without bleeding: Secondary | ICD-10-CM | POA: Diagnosis not present

## 2021-11-05 DIAGNOSIS — Z803 Family history of malignant neoplasm of breast: Secondary | ICD-10-CM

## 2021-11-05 DIAGNOSIS — I5032 Chronic diastolic (congestive) heart failure: Secondary | ICD-10-CM | POA: Diagnosis not present

## 2021-11-05 DIAGNOSIS — Z853 Personal history of malignant neoplasm of breast: Secondary | ICD-10-CM | POA: Diagnosis not present

## 2021-11-05 DIAGNOSIS — E876 Hypokalemia: Secondary | ICD-10-CM

## 2021-11-05 DIAGNOSIS — E119 Type 2 diabetes mellitus without complications: Secondary | ICD-10-CM | POA: Diagnosis present

## 2021-11-05 DIAGNOSIS — N390 Urinary tract infection, site not specified: Secondary | ICD-10-CM | POA: Diagnosis not present

## 2021-11-05 DIAGNOSIS — D509 Iron deficiency anemia, unspecified: Secondary | ICD-10-CM | POA: Diagnosis not present

## 2021-11-05 DIAGNOSIS — I2510 Atherosclerotic heart disease of native coronary artery without angina pectoris: Secondary | ICD-10-CM | POA: Diagnosis present

## 2021-11-05 DIAGNOSIS — Z885 Allergy status to narcotic agent status: Secondary | ICD-10-CM | POA: Diagnosis not present

## 2021-11-05 DIAGNOSIS — D649 Anemia, unspecified: Secondary | ICD-10-CM | POA: Diagnosis not present

## 2021-11-05 DIAGNOSIS — T502X5A Adverse effect of carbonic-anhydrase inhibitors, benzothiadiazides and other diuretics, initial encounter: Secondary | ICD-10-CM | POA: Diagnosis not present

## 2021-11-05 DIAGNOSIS — Z955 Presence of coronary angioplasty implant and graft: Secondary | ICD-10-CM

## 2021-11-05 DIAGNOSIS — R109 Unspecified abdominal pain: Secondary | ICD-10-CM | POA: Diagnosis not present

## 2021-11-05 DIAGNOSIS — Z7901 Long term (current) use of anticoagulants: Secondary | ICD-10-CM

## 2021-11-05 DIAGNOSIS — Z833 Family history of diabetes mellitus: Secondary | ICD-10-CM

## 2021-11-05 DIAGNOSIS — M329 Systemic lupus erythematosus, unspecified: Secondary | ICD-10-CM | POA: Diagnosis present

## 2021-11-05 DIAGNOSIS — E1129 Type 2 diabetes mellitus with other diabetic kidney complication: Secondary | ICD-10-CM | POA: Diagnosis not present

## 2021-11-05 DIAGNOSIS — Z79899 Other long term (current) drug therapy: Secondary | ICD-10-CM

## 2021-11-05 DIAGNOSIS — R0602 Shortness of breath: Secondary | ICD-10-CM | POA: Diagnosis not present

## 2021-11-05 DIAGNOSIS — Z8249 Family history of ischemic heart disease and other diseases of the circulatory system: Secondary | ICD-10-CM

## 2021-11-05 DIAGNOSIS — I5030 Unspecified diastolic (congestive) heart failure: Secondary | ICD-10-CM | POA: Diagnosis not present

## 2021-11-05 LAB — CBC
HCT: 29 % — ABNORMAL LOW (ref 36.0–46.0)
Hemoglobin: 9.2 g/dL — ABNORMAL LOW (ref 12.0–15.0)
MCH: 28 pg (ref 26.0–34.0)
MCHC: 31.7 g/dL (ref 30.0–36.0)
MCV: 88.1 fL (ref 80.0–100.0)
Platelets: 274 10*3/uL (ref 150–400)
RBC: 3.29 MIL/uL — ABNORMAL LOW (ref 3.87–5.11)
RDW: 18.6 % — ABNORMAL HIGH (ref 11.5–15.5)
WBC: 12.3 10*3/uL — ABNORMAL HIGH (ref 4.0–10.5)
nRBC: 0 % (ref 0.0–0.2)

## 2021-11-05 LAB — RENAL FUNCTION PANEL
Albumin: 3.1 g/dL — ABNORMAL LOW (ref 3.5–5.0)
Anion gap: 11 (ref 5–15)
BUN: 120 mg/dL — ABNORMAL HIGH (ref 8–23)
CO2: 23 mmol/L (ref 22–32)
Calcium: 9.3 mg/dL (ref 8.9–10.3)
Chloride: 107 mmol/L (ref 98–111)
Creatinine, Ser: 4.12 mg/dL — ABNORMAL HIGH (ref 0.44–1.00)
GFR, Estimated: 11 mL/min — ABNORMAL LOW (ref >60)
Glucose, Bld: 118 mg/dL — ABNORMAL HIGH (ref 70–99)
Phosphorus: 4.1 mg/dL (ref 2.5–4.6)
Potassium: 3.6 mmol/L (ref 3.5–5.1)
Sodium: 141 mmol/L (ref 135–145)

## 2021-11-05 LAB — CREATININE, URINE, RANDOM: Creatinine, Urine: 68 mg/dL

## 2021-11-05 LAB — PROTIME-INR
INR: 1.3 — ABNORMAL HIGH (ref 0.8–1.2)
Prothrombin Time: 15.7 seconds — ABNORMAL HIGH (ref 11.4–15.2)

## 2021-11-05 LAB — URINALYSIS, MICROSCOPIC (REFLEX)

## 2021-11-05 LAB — URINALYSIS, ROUTINE W REFLEX MICROSCOPIC
Bilirubin Urine: NEGATIVE
Glucose, UA: NEGATIVE mg/dL
Hgb urine dipstick: NEGATIVE
Ketones, ur: NEGATIVE mg/dL
Nitrite: NEGATIVE
Protein, ur: NEGATIVE mg/dL
Specific Gravity, Urine: 1.01 (ref 1.005–1.030)
pH: 5.5 (ref 5.0–8.0)

## 2021-11-05 LAB — BASIC METABOLIC PANEL
Anion gap: 13 (ref 5–15)
BUN: 122 mg/dL — ABNORMAL HIGH (ref 8–23)
CO2: 22 mmol/L (ref 22–32)
Calcium: 9.1 mg/dL (ref 8.9–10.3)
Chloride: 104 mmol/L (ref 98–111)
Creatinine, Ser: 4.25 mg/dL — ABNORMAL HIGH (ref 0.44–1.00)
GFR, Estimated: 10 mL/min — ABNORMAL LOW (ref >60)
Glucose, Bld: 132 mg/dL — ABNORMAL HIGH (ref 70–99)
Potassium: 3.2 mmol/L — ABNORMAL LOW (ref 3.5–5.1)
Sodium: 139 mmol/L (ref 135–145)

## 2021-11-05 LAB — SODIUM, URINE, RANDOM: Sodium, Ur: 49 mmol/L

## 2021-11-05 MED ORDER — LOPERAMIDE HCL 2 MG PO CAPS
4.0000 mg | ORAL_CAPSULE | Freq: Four times a day (QID) | ORAL | Status: DC | PRN
  Administered 2021-11-05 – 2021-11-06 (×2): 4 mg via ORAL
  Filled 2021-11-05 (×2): qty 2

## 2021-11-05 MED ORDER — ALBUTEROL SULFATE (2.5 MG/3ML) 0.083% IN NEBU
2.5000 mg | INHALATION_SOLUTION | Freq: Four times a day (QID) | RESPIRATORY_TRACT | Status: DC | PRN
Start: 2021-11-05 — End: 2021-11-08

## 2021-11-05 MED ORDER — SACCHAROMYCES BOULARDII 250 MG PO CAPS
250.0000 mg | ORAL_CAPSULE | Freq: Two times a day (BID) | ORAL | Status: DC
  Administered 2021-11-06 – 2021-11-08 (×5): 250 mg via ORAL
  Filled 2021-11-05 (×8): qty 1

## 2021-11-05 MED ORDER — FLUOXETINE HCL 20 MG PO CAPS
20.0000 mg | ORAL_CAPSULE | Freq: Every day | ORAL | Status: DC
  Administered 2021-11-05 – 2021-11-08 (×4): 20 mg via ORAL
  Filled 2021-11-05 (×4): qty 1

## 2021-11-05 MED ORDER — PANTOPRAZOLE SODIUM 40 MG PO TBEC
40.0000 mg | DELAYED_RELEASE_TABLET | Freq: Every day | ORAL | Status: DC
  Administered 2021-11-05 – 2021-11-08 (×4): 40 mg via ORAL
  Filled 2021-11-05 (×4): qty 1

## 2021-11-05 MED ORDER — FAMOTIDINE 20 MG PO TABS
10.0000 mg | ORAL_TABLET | Freq: Every day | ORAL | Status: DC
  Administered 2021-11-05 – 2021-11-07 (×3): 10 mg via ORAL
  Filled 2021-11-05 (×3): qty 1

## 2021-11-05 MED ORDER — ACETAMINOPHEN 325 MG PO TABS
650.0000 mg | ORAL_TABLET | Freq: Four times a day (QID) | ORAL | Status: DC | PRN
  Administered 2021-11-05 – 2021-11-06 (×2): 650 mg via ORAL
  Filled 2021-11-05 (×2): qty 2

## 2021-11-05 MED ORDER — ACETAMINOPHEN 650 MG RE SUPP: 650.0000 mg | Freq: Four times a day (QID) | RECTAL | Status: DC | PRN

## 2021-11-05 MED ORDER — SODIUM CHLORIDE 0.9 % IV SOLN: INTRAVENOUS | Status: DC

## 2021-11-05 MED ORDER — SODIUM CHLORIDE 0.9 % IV SOLN
1.0000 g | INTRAVENOUS | Status: DC
  Administered 2021-11-05 – 2021-11-07 (×3): 1 g via INTRAVENOUS
  Filled 2021-11-05 (×3): qty 10

## 2021-11-05 MED ORDER — MIRTAZAPINE 15 MG PO TABS
7.5000 mg | ORAL_TABLET | Freq: Every day | ORAL | Status: DC
  Administered 2021-11-05 – 2021-11-07 (×3): 7.5 mg via ORAL
  Filled 2021-11-05 (×4): qty 1

## 2021-11-05 MED ORDER — SODIUM CHLORIDE 0.9% FLUSH
3.0000 mL | Freq: Two times a day (BID) | INTRAVENOUS | Status: DC
  Administered 2021-11-05 – 2021-11-07 (×5): 3 mL via INTRAVENOUS

## 2021-11-05 MED ORDER — PANCRELIPASE (LIP-PROT-AMYL) 36000-114000 UNITS PO CPEP
36000.0000 [IU] | ORAL_CAPSULE | Freq: Three times a day (TID) | ORAL | Status: DC
  Administered 2021-11-06 – 2021-11-08 (×6): 36000 [IU] via ORAL
  Filled 2021-11-05 (×9): qty 1

## 2021-11-05 MED ORDER — SODIUM CHLORIDE 0.9 % IV BOLUS
500.0000 mL | Freq: Once | INTRAVENOUS | Status: AC
  Administered 2021-11-05: 500 mL via INTRAVENOUS

## 2021-11-05 MED ORDER — SODIUM CHLORIDE 0.9 % IV SOLN: INTRAVENOUS | Status: AC

## 2021-11-05 MED ORDER — BUDESONIDE 3 MG PO CPEP
3.0000 mg | ORAL_CAPSULE | Freq: Every day | ORAL | Status: DC
  Administered 2021-11-05 – 2021-11-08 (×4): 3 mg via ORAL
  Filled 2021-11-05 (×4): qty 1

## 2021-11-05 MED ORDER — ACETAMINOPHEN 500 MG PO TABS
1000.0000 mg | ORAL_TABLET | Freq: Once | ORAL | Status: AC
  Administered 2021-11-05: 1000 mg via ORAL
  Filled 2021-11-05: qty 2

## 2021-11-05 MED ORDER — POTASSIUM CHLORIDE CRYS ER 20 MEQ PO TBCR
30.0000 meq | EXTENDED_RELEASE_TABLET | ORAL | Status: AC
  Administered 2021-11-05: 30 meq via ORAL
  Filled 2021-11-05: qty 1

## 2021-11-05 MED ORDER — APIXABAN 2.5 MG PO TABS
2.5000 mg | ORAL_TABLET | Freq: Two times a day (BID) | ORAL | Status: DC
  Administered 2021-11-05 – 2021-11-08 (×7): 2.5 mg via ORAL
  Filled 2021-11-05 (×9): qty 1
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
## 2021-11-05 NOTE — ED Triage Notes (Signed)
Patient reports that she developed shortness of breath with some right arm pain last night. Just had recent admission to the hospital for chf. Denies chest pain. Speaking in complete sentences, no cough, no fever

## 2021-11-05 NOTE — H&P (Addendum)
History and Physical    Patient: [name] PXT:[id number] DOB: 1946-03-12 DOA: 11/05/2021 DOS: the patient was seen and examined on 11/05/2021 PCP: Seward Carol, MD  Patient coming from: Home  Chief Complaint:  Chief Complaint  Patient presents with   Shortness of Breath   HPI: [name] is a 76 y.o. female with medical history significant of HTN, CKD stage IV, chronic diastolic CHF, CAD s/p stent, DVT, CVA, DM type II, and chronic gastritis who presented with complaints of weakness.   She had been admitted in the hospital 8/3-4 due to weakness thought due to acute on chronic heart with preserved EF which improved with Lasix.  Subsequently admitted 8/7-14 again acute on chronic heart failure with preserved EF.  Patient was again treated with furosemide furosemide.  Prior to discharge creatinine was noted to be trending up to 3.3.  After getting home patient stated that she was doing okay.  Patient had been holding her diuretics over the last 3 days due to rising kidney function.  Yesterday, patient reported staying in the bed all day due to feeling globally weak.  She does report having some urinary frequency which is not normal for her, intermittent substernal chest discomfort, intermittent lower abdominal pain, and left arm pain.  Denied having any nausea or vomiting episodes until today after eating lunch in the hospital.  Denies having any fever.  She had intermittently been taking Tylenol for pain symptoms with some improvement.  On admission into the emergency department patient was noted to have stable vital signs.  Labs significant for WBC 12.3, hemoglobin 9.2, potassium 3.2, BUN 122, and creatinine 4.25.  Chest x-ray showed no acute abnormality.  Urinalysis noted small leukocytes, many bacteria, 11-20 WBCs and 6-10 WBCs.  Patient was given 500 mL bolus of IV fluids.   Review of Systems: As mentioned in the history of present illness. All other systems reviewed and are  negative. Past Medical History:  Diagnosis Date   Abdominal discomfort    Chronic N/V/D. Presumptive dx Crohn's dx per elevated p ANCA. Failed Entocort and Pentasa. Sep 2003 - ileocolectomy c anastomosis per Dr Deon Pilling 2/2 adhesions - path was hegative for Crohns. EGD, Sm bowel follow through (11/03), and an eteroclysis (10/03) were unrevealing. Cuases hypomag and hypocalcemia.   Adnexal mass 8/03   s/p lap BSO (R ovarian fibroma) & lysis of adhesions   Allergy    Seasonal   Anemia    Multifactorial. Baseline HgB 10-11 ish. B12 def - 150 in 3/10. Fe Def - ferritin 35 3/10. Both are being repleted.   Breast cancer ([place]) 03/16/13   right, 5 o'clock   CAD (coronary artery disease) 1996   1996 - PTCA and angioplasty diagonal branch. 2000 - Rotoblator & angiopllasty of diagonal. 2006 - subendocardial AMI, DES to proximal LAD.Marland Kitchen Also had 90% stenosis in distal apical LAD. EF 55 with apical hypokinesis. Indefinite ASA and Plavix.   CHF (congestive heart failure) (HCC)    Chronic kidney disease    Chronic renal insuff baseline Cr 1.2 - 1.4 ish.   Chronic kidney disease, stage III (moderate) (HCC)    Chronic renal insuff baseline Cr 1.2 - 1.4 ish. New baseline about 2 (2018)   Chronic pain    CT 10/10 = Spinal stenosis L2 - S1.   COVID-19 11/02/2020   Diabetes mellitus    Insulin dependent   Diabetes mellitus type 2 in obese ([place]) 05/03/2010   Managed on lantus and novolog. Has diabetic  nephropathy. Metformin D/C'd 2012 2/2 creatinine.  No diabetic retinopathy per 3/11.    Gout    Hx of radiation therapy 06/02/13- 07/16/13   right rbeast 4500 cGy 25 sessions, right breast boost 1600 cGy in 8 sessions   Hyperlipidemia    Managed with both a statin and Welchol. Welchol stopped 2014 2/2 cost and started on fenofibrate    Hypertension    2006 B renal arteries patent. 2003 MRA - no RAS. 2003 pheo W/U Dr Hassell Done reportedly negative.   Hypoxia 07/23/2017   Lupus ([place])    Lymphedema of breast    Personal  history of radiation therapy 2015   Pneumonia of left lung due to infectious organism    RBBB    Renal vein thrombosis (HCC)    SBO (small bowel obstruction) ([place]) 09/17/2017   Secondary hyperparathyroidism ([place])    Stroke ([place])    Incidental finding MRI 2002 L lacunar infarct   Vitamin B12 deficiency    Vitamin D deficiency    Wears dentures    top   Past Surgical History:  Procedure Laterality Date   ABDOMINAL HYSTERECTOMY     BILATERAL SALPINGOOPHORECTOMY  8/03   Lap BSO (R ovarian fibroma) and adhesion lysis   BIOPSY  09/08/2020   Procedure: BIOPSY;  Surgeon: Ronald Lobo, MD;  Location: [facility];  Service: Endoscopy;;   BOWEL RESECTION  2003   ileocolectomy with anastomosis 2/2 adhesions   BREAST BIOPSY Right 2015   BREAST BIOPSY Right 07/2014   BREAST LUMPECTOMY Right 04/22/2013   BREAST LUMPECTOMY WITH NEEDLE LOCALIZATION AND AXILLARY SENTINEL LYMPH NODE BX Right 04/22/2013   Procedure: BREAST LUMPECTOMY WITH NEEDLE LOCALIZATION AND AXILLARY SENTINEL LYMPH NODE BX;  Surgeon: Stark Klein, MD;  Location: [facility];  Service: General;  Laterality: Right;   CARDIAC CATHETERIZATION     2 stents   CHOLECYSTECTOMY     COLONOSCOPY     ESOPHAGOGASTRODUODENOSCOPY N/A 09/08/2020   Procedure: ESOPHAGOGASTRODUODENOSCOPY (EGD);  Surgeon: Ronald Lobo, MD;  Location: [facility];  Service: Endoscopy;  Laterality: N/A;   HEMICOLECTOMY     R sided hemicolectomy   HERNIA REPAIR     Ventral hernia repair   PTCA  4/06   Social History:  reports that she has never smoked. She has never used smokeless tobacco. She reports that she does not drink alcohol and does not use drugs.  Allergies  Allergen Reactions   Percocet [Oxycodone-Acetaminophen] Other (See Comments)    headaches   Diazepam Other (See Comments)    Agitation    Haloperidol Lactate Nausea And Vomiting   Lorazepam Nausea Only   Morphine Sulfate Other (See Comments)    Agitation   Propoxyphene  Hcl Nausea And Vomiting   Tramadol Hcl Nausea And Vomiting   Anesthetics, Halogenated Other (See Comments)    Per patient's daughter, she can not tolerate anesthetics.    Etomidate Other (See Comments)    Heart stopped during anesthesia   Fentanyl Other (See Comments)    Heart stopped during anesthesia    Haloperidol Nausea And Vomiting   Morphine Sulfate Nausea Only   Oxycodone-Acetaminophen Other (See Comments)    headaches   Propoxyphene Nausea And Vomiting   Tramadol Hcl Nausea And Vomiting   Versed [Midazolam] Other (See Comments)    Heart stopped during anesthesia    Family History  Problem Relation Age of Onset   Pancreatic cancer Brother 44   Breast cancer Mother 74   Lung cancer Maternal Aunt  Breast cancer Maternal Aunt    Prostate cancer Maternal Uncle    Heart attack Maternal Grandmother    Breast cancer Maternal Grandmother    Colon cancer Maternal Grandmother    Clotting disorder Maternal Grandmother    Diabetes Maternal Grandmother    Kidney failure Maternal Aunt    Hypertension Daughter     Prior to Admission medications   Medication Sig Start Date End Date Taking? Authorizing Provider  acetaminophen (TYLENOL) 500 MG tablet Take 1,000 mg by mouth every 6 (six) hours as needed for moderate pain or headache.    [provider]  amLODipine (NORVASC) 10 MG tablet Take 1/2 tablets (5 mg total) by mouth daily. 10/31/21 11/30/21  Arrien, Jimmy Picket, MD  apixaban (ELIQUIS) 2.5 MG TABS tablet Take 1 tablet (2.5 mg total) by mouth 2 (two) times daily. 10/30/21   Arrien, Jimmy Picket, MD  Artificial Tear Ointment (DRY EYES OP) Place 1 drop into the right eye 2 (two) times daily as needed (dry eye).    [provider]  budesonide (ENTOCORT EC) 3 MG 24 hr capsule Take 1 capsule by mouth once daily Patient taking differently: Take 3 mg by mouth daily. 11/30/20   Gatha Mayer, MD  Cyanocobalamin (B-12 COMPLIANCE INJECTION IJ) Inject 1 vial as  directed every 30 (thirty) days.    [provider]  ergocalciferol (VITAMIN D2) 1.25 MG (50000 UT) capsule Take 1 capsule (50,000 Units total) by mouth once a week. 08/02/20   Truitt Merle, MD  famotidine (PEPCID) 20 MG tablet Take 20 mg by mouth at bedtime. 10/14/21   [provider]  FLUoxetine (PROZAC) 20 MG capsule Take 20 mg by mouth daily. 10/12/21   [provider]  hydrALAZINE (APRESOLINE) 100 MG tablet Take 0.5 tablets (50 mg total) by mouth 2 (two) times daily. 09/24/21   Josue Hector, MD  lipase/protease/amylase (CREON) 36000 UNITS CPEP capsule Take 36,000 Units by mouth 3 (three) times daily with meals.    [provider]  loperamide (IMODIUM A-D) 2 MG tablet Take 4 mg by mouth 4 (four) times daily as needed for diarrhea or loose stools.    [provider]  mirtazapine (REMERON) 7.5 MG tablet Take 1 tablet (7.5 mg total) by mouth at bedtime. 12/22/20   Truitt Merle, MD  nitroGLYCERIN (NITROSTAT) 0.4 MG SL tablet DISSOLVE 1 TABLET UNDER THE TONGUE EVERY 5 MINUTES AS  NEEDED FOR CHEST PAIN. MAX  OF 3 TABLETS IN 15 MINUTES. CALL 911 IF PAIN PERSISTS. Patient taking differently: Place 0.4 mg under the tongue every 5 (five) minutes as needed for chest pain. 10/04/20   Jerline Pain, MD  ondansetron (ZOFRAN-ODT) 4 MG disintegrating tablet DISSOLVE 1 TABLET IN MOUTH EVERY 8 HOURS AS NEEDED FOR NAUSEA OR VOMITING Patient taking differently: Take 4 mg by mouth every 8 (eight) hours as needed for nausea or vomiting. 09/28/21   Truitt Merle, MD  pantoprazole (PROTONIX) 40 MG tablet TAKE 1 TABLET BY MOUTH  DAILY BEFORE BREAKFAST Patient taking differently: Take 40 mg by mouth daily. 10/24/20   Gatha Mayer, MD  potassium chloride (KLOR-CON) 20 MEQ packet Take 1 tablet (20 mEq) by mouth daily. 10/31/21 11/30/21  Arrien, Jimmy Picket, MD  torsemide (DEMADEX) 20 MG tablet Take 2 tablets (40 mg total) by mouth daily. 10/31/21 11/30/21  Tawni Millers, MD     Physical Exam: Vitals:   11/05/21 0909  BP: 110/65  Pulse: 77  Resp: 15  Temp: 97.8  F (36.6 C)  TempSrc: Oral  SpO2: 98%   Exam  Constitutional: Elderly female who does not appear to feel well.  Able to follow all commands eyes: PERRL, lids and conjunctivae normal ENMT: Mucous membranes are moist. Posterior pharynx clear of any exudate or lesions.  Neck: normal, supple, no masses, no thyromegaly Respiratory: clear to auscultation bilaterally, no wheezing, no crackles. Normal respiratory effort. No accessory muscle use.  Cardiovascular: Regular rate and rhythm, with positive murmur.  Trace lower extremity edema.  Abdomen: no tenderness, no masses palpated.   Bowel sounds positive.  Musculoskeletal: no clubbing / cyanosis. No joint deformity upper and lower extremities. Good ROM, no contractures. Normal muscle tone.  Skin: no rashes, lesions, ulcers. No induration Neurologic: CN 2-12 grossly intact.  Appears to be 4+/5 in all 4 extremities Psychiatric: Normal judgment and insight. Alert and oriented x 3. Normal mood.    Data Reviewed:  EKG normal sinus rhythm at 76 bpm with QTc prolonged at 495.  Otherwise noted similar to previous.  Labs, imaging, and pertinent records reviewed as noted above in HPI  Assessment and Plan:  Acute kidney injury superimposed on chronic kidney disease stage IV Patient presents with creatinine elevated up to 4.125 with BUN 122.  Prior to discharge creatinine has been 3.33.  Patient does not have a fistula in place, but is followed by neurology in the outpatient setting.  She had been given 500 mL of IV fluids in the ED. -Admit to a medical telemetry bed -Check urine creatinine, sodium, urine urea FeNa 2.2% concerning for intrinsic cause -Normal saline IV fluids at 50 mL/h for total 1 L -Recheck kidney function in a.m. and reassess for continued need of IV hydration -Consider formally consulting nephrology if kidney function does not appear to  improve  Urinary tract infection Acute.  Patient reported increased urinary frequency.  Urinalysis noted small leukocytes, many bacteria, 11-20 RBCs per hpf, and 6-10 WBCs. -Check urine culture -Rocephin IV  Leukocytosis Acute.  WBC elevated at 12.3 but patient without any signs of fever.  Chest x-ray was otherwise clear.  Urinalysis gave concern for possible UTI. -Antibiotics as noted above  Weakness Acute on chronic.  Patient presented with complaints of worsening weakness.  Questioned if she was over diuresed as the cause of her symptoms or if BUN rising greater than 100. -PT/OT to eval and treat -Transitions of care consulted  Nausea and vomiting diarrhea Acute.  Patient noted to have episode of nausea and vomiting while in the ED.  No reports of nausea vomiting prior to the day.  She does report intermittently having loose stools -Aspiration precautions with elevation head of bed -Monitor intake and output.  Consider further work-up if diarrhea persist  Hypokalemia Acute.  Initial potassium noted to be 3.2. -Give potassium chloride 30 mEq p.o. -Continue to monitor and replace as needed  CAD Patient had reported some intermittent chest discomfort.  EKG appears similar to previous.  Echocardiogram from 8/8 did not note any acute change to heart function. -Check high-sensitivity troponin  Essential hypertension On admission blood pressure noted to be 110/65.  Home blood pressure regimen includes amlodipine 5 mg daily and hydralazine 50 mg twice daily -Hold blood pressure medications until systolic pressures greater than 110-115.  Consider resuming in a.m. if blood pressures are improved  Iron deficiency anemia/anemia of chronic kidney disease Hemoglobin 9.2 g/dL which appears around patient's recent values. -Continue to monitor  Chronic diastolic congestive heart failure Patient does not appear to  be acutely fluid overloaded at this time.  Echocardiogram revealed EF 60 to  65% with indeterminate diastolic parameters on 04/25/10. -Strict I&O's and daily weights  Diet-controlled diabetes mellitus type 2 Last hemoglobin A1c 5.5 on 8/4 -Renal/carb modified diet ordered  History of DVT, renal vein thrombosis -Continue Eliquis  Chronic gastritis GERD -Continue current regimen budesonide, Creon, Protonix, Pepcid  Advance Care Planning:   Code Status: Full Code  Consults:   Family Communication: Daughter updated at bedside and her other daughter over the phone Severity of Illness: The appropriate patient status for this patient is INPATIENT. Inpatient status is judged to be reasonable and necessary in order to provide the required intensity of service to ensure the patient's safety. The patient's presenting symptoms, physical exam findings, and initial radiographic and laboratory data in the context of their chronic comorbidities is felt to place them at high risk for further clinical deterioration. Furthermore, it is not anticipated that the patient will be medically stable for discharge from the hospital within 2 midnights of admission.   * I certify that at the point of admission it is my clinical judgment that the patient will require inpatient hospital care spanning beyond 2 midnights from the point of admission due to high intensity of service, high risk for further deterioration and high frequency of surveillance required.*  Author: Norval Morton, MD 11/05/2021 11:44 AM  For on call review [email or web address].

## 2021-11-05 NOTE — ED Provider Notes (Signed)
[facility] EMERGENCY DEPARTMENT Provider Note   CSN: [id number] Arrival date & time: 11/05/21  8850     History  Chief complaint: Weakness  [name] is a 76 y.o. female.  HPI   Patient has history of acute on chronic heart failure, acute on chronic kidney injury, coronary artery disease, hypertension, diabetes, renal vein thrombosis, depression.  Patient was recently admitted to the hospital on August 7 and discharged on August 14.  At that time patient was treated for primarily a CHF exacerbation.  Patient underwent diuresis during her hospital stay.  Patient's renal function was noted to be elevated with a creatinine of 3.3.  This morning the patient started having some increasing shortness of breath.  She was also having increasing weakness.  Patient had difficulty standing or walking.  She felt overall weak and trembling.  Not had any fevers.  No falls.  No headache or neck pain.  No chest pain.  Home Medications Prior to Admission medications   Medication Sig Start Date End Date Taking? Authorizing Provider  acetaminophen (TYLENOL) 500 MG tablet Take 1,000 mg by mouth every 6 (six) hours as needed for moderate pain or headache.    [provider]  amLODipine (NORVASC) 10 MG tablet Take 1/2 tablets (5 mg total) by mouth daily. 10/31/21 11/30/21  Arrien, Jimmy Picket, MD  apixaban (ELIQUIS) 2.5 MG TABS tablet Take 1 tablet (2.5 mg total) by mouth 2 (two) times daily. 10/30/21   Arrien, Jimmy Picket, MD  Artificial Tear Ointment (DRY EYES OP) Place 1 drop into the right eye 2 (two) times daily as needed (dry eye).    [provider]  budesonide (ENTOCORT EC) 3 MG 24 hr capsule Take 1 capsule by mouth once daily Patient taking differently: Take 3 mg by mouth daily. 11/30/20   Gatha Mayer, MD  Cyanocobalamin (B-12 COMPLIANCE INJECTION IJ) Inject 1 vial as directed every 30 (thirty) days.    [provider]  ergocalciferol  (VITAMIN D2) 1.25 MG (50000 UT) capsule Take 1 capsule (50,000 Units total) by mouth once a week. 08/02/20   Truitt Merle, MD  famotidine (PEPCID) 20 MG tablet Take 20 mg by mouth at bedtime. 10/14/21   [provider]  FLUoxetine (PROZAC) 20 MG capsule Take 20 mg by mouth daily. 10/12/21   [provider]  hydrALAZINE (APRESOLINE) 100 MG tablet Take 0.5 tablets (50 mg total) by mouth 2 (two) times daily. 09/24/21   Josue Hector, MD  lipase/protease/amylase (CREON) 36000 UNITS CPEP capsule Take 36,000 Units by mouth 3 (three) times daily with meals.    [provider]  loperamide (IMODIUM A-D) 2 MG tablet Take 4 mg by mouth 4 (four) times daily as needed for diarrhea or loose stools.    [provider]  mirtazapine (REMERON) 7.5 MG tablet Take 1 tablet (7.5 mg total) by mouth at bedtime. 12/22/20   Truitt Merle, MD  nitroGLYCERIN (NITROSTAT) 0.4 MG SL tablet DISSOLVE 1 TABLET UNDER THE TONGUE EVERY 5 MINUTES AS  NEEDED FOR CHEST PAIN. MAX  OF 3 TABLETS IN 15 MINUTES. CALL 911 IF PAIN PERSISTS. Patient taking differently: Place 0.4 mg under the tongue every 5 (five) minutes as needed for chest pain. 10/04/20   Jerline Pain, MD  ondansetron (ZOFRAN-ODT) 4 MG disintegrating tablet DISSOLVE 1 TABLET IN MOUTH EVERY 8 HOURS AS NEEDED FOR NAUSEA OR VOMITING Patient taking differently: Take 4 mg by mouth every 8 (eight) hours as needed for  nausea or vomiting. 09/28/21   Truitt Merle, MD  pantoprazole (PROTONIX) 40 MG tablet TAKE 1 TABLET BY MOUTH  DAILY BEFORE BREAKFAST Patient taking differently: Take 40 mg by mouth daily. 10/24/20   Gatha Mayer, MD  potassium chloride (KLOR-CON) 20 MEQ packet Take 1 tablet (20 mEq) by mouth daily. 10/31/21 11/30/21  Arrien, Jimmy Picket, MD  torsemide (DEMADEX) 20 MG tablet Take 2 tablets (40 mg total) by mouth daily. 10/31/21 11/30/21  Arrien, Jimmy Picket, MD      Allergies    Percocet [oxycodone-acetaminophen]; Diazepam; Haloperidol  lactate; Lorazepam; Morphine sulfate; Propoxyphene hcl; Tramadol hcl; Anesthetics, halogenated; Etomidate; Fentanyl; Haloperidol; Morphine sulfate; Oxycodone-acetaminophen; Propoxyphene; Tramadol hcl; and Versed [midazolam]    Review of Systems   Review of Systems  Physical Exam Updated Vital Signs BP 110/65 (BP Location: Right Arm)   Pulse 77   Temp 97.8 F (36.6 C) (Oral)   Resp 15   SpO2 98%  Physical Exam Vitals and nursing note reviewed.  Constitutional:      Appearance: She is well-developed. She is ill-appearing.  HENT:     Head: Normocephalic and atraumatic.     Right Ear: External ear normal.     Left Ear: External ear normal.  Eyes:     General: No scleral icterus.       Right eye: No discharge.        Left eye: No discharge.     Conjunctiva/sclera: Conjunctivae normal.  Neck:     Trachea: No tracheal deviation.  Cardiovascular:     Rate and Rhythm: Normal rate and regular rhythm.  Pulmonary:     Effort: Pulmonary effort is normal. No respiratory distress.     Breath sounds: Normal breath sounds. No stridor. No wheezing or rales.  Abdominal:     General: Bowel sounds are normal. There is no distension.     Palpations: Abdomen is soft.     Tenderness: There is no abdominal tenderness. There is no guarding or rebound.  Musculoskeletal:        General: No tenderness or deformity.     Cervical back: Neck supple.  Skin:    General: Skin is warm and dry.     Findings: No rash.  Neurological:     General: No focal deficit present.     Mental Status: She is alert.     Cranial Nerves: No cranial nerve deficit (no facial droop, extraocular movements intact, no slurred speech).     Sensory: No sensory deficit.     Motor: Weakness present. No abnormal muscle tone or seizure activity.     Coordination: Coordination normal.     Comments: Patient has difficulty lifting both arms off the bed.  She also has difficulty lifting both legs off the bed, no facial droop, no  sensory deficits, tremor noted with effort  Psychiatric:        Mood and Affect: Mood normal.     ED Results / Procedures / Treatments   Labs (all labs ordered are listed, but only abnormal results are displayed) Labs Reviewed  BASIC METABOLIC PANEL - Abnormal; Notable for the following components:      Result Value   Potassium 3.2 (*)    Glucose, Bld 132 (*)    BUN 122 (*)    Creatinine, Ser 4.25 (*)    GFR, Estimated 10 (*)    All other components within normal limits  CBC - Abnormal; Notable for the following components:   WBC 12.3 (*)  RBC 3.29 (*)    Hemoglobin 9.2 (*)    HCT 29.0 (*)    RDW 18.6 (*)    All other components within normal limits  PROTIME-INR  URINALYSIS, ROUTINE W REFLEX MICROSCOPIC    EKG None  Radiology DG Chest 2 View  Result Date: 11/05/2021 CLINICAL DATA:  Shortness of breath. EXAM: CHEST - 2 VIEW COMPARISON:  10/23/2021 FINDINGS: The lungs are clear without focal pneumonia, edema, pneumothorax or pleural effusion. The cardiopericardial silhouette is within normal limits for size. The visualized bony structures of the thorax are unremarkable. IMPRESSION: No active cardiopulmonary disease. Electronically Signed   By: Misty Stanley M.D.   On: 11/05/2021 09:46    Procedures Procedures    Medications Ordered in ED Medications  sodium chloride 0.9 % bolus 500 mL (has no administration in time range)    ED Course/ Medical Decision Making/ A&P Clinical Course as of 11/05/21 1148  Sun Nov 05, 2021  9373 Basic metabolic panel(!) Renal function worsening hypokalemia noted [JK]  1118 CBC(!) White blood cell count elevated.  Hemoglobin is stable [JK]  1118 DG Chest 2 View Chest x-ray without acute findings [JK]  1148 Discussed case discussed with Dr. Tamala Julian [JK]    Clinical Course User Index [JK] Dorie Rank, MD                           Medical Decision Making Problems Addressed: AKI (acute kidney injury) [facility]): chronic illness or injury  with exacerbation, progression, or side effects of treatment Weakness: acute illness or injury  Amount and/or Complexity of Data Reviewed Labs: ordered. Decision-making details documented in ED Course. Radiology: ordered. Decision-making details documented in ED Course.  Risk Decision regarding hospitalization.   Patient presents with increasing global weakness.  Patient also complained of some shortness of breath.  No signs of pulmonary edema on chest x-ray.  Her oxygen saturation is normal.  Her respiratory rate is normal.  No signs of pneumonia or CHF exacerbation at this time.  Patient is globally weak and she has difficulty sitting up in bed.  Her labs are notable for significantly elevated BUN and worsening creatinine.  Suspect this is contributing to her weakness.  She has a difficult case of chronic heart failure associate with chronic kidney disease.  Suspect her renal Function worsening is related to her diuresis.  I will give a small fluid bolus.  Plan on consult the medical service for admission.        Final Clinical Impression(s) / ED Diagnoses Final diagnoses:  AKI (acute kidney injury) ([place])  Weakness    Rx / DC Orders ED Discharge Orders     None         Dorie Rank, MD 11/05/21 1148

## 2021-11-06 DIAGNOSIS — N189 Chronic kidney disease, unspecified: Secondary | ICD-10-CM | POA: Diagnosis not present

## 2021-11-06 DIAGNOSIS — N179 Acute kidney failure, unspecified: Secondary | ICD-10-CM | POA: Diagnosis not present

## 2021-11-06 LAB — CBC
HCT: 27.1 % — ABNORMAL LOW (ref 36.0–46.0)
Hemoglobin: 8.3 g/dL — ABNORMAL LOW (ref 12.0–15.0)
MCH: 27.4 pg (ref 26.0–34.0)
MCHC: 30.6 g/dL (ref 30.0–36.0)
MCV: 89.4 fL (ref 80.0–100.0)
Platelets: 245 10*3/uL (ref 150–400)
RBC: 3.03 MIL/uL — ABNORMAL LOW (ref 3.87–5.11)
RDW: 18.9 % — ABNORMAL HIGH (ref 11.5–15.5)
WBC: 11.7 10*3/uL — ABNORMAL HIGH (ref 4.0–10.5)
nRBC: 0 % (ref 0.0–0.2)

## 2021-11-06 LAB — UREA NITROGEN, URINE: Urea Nitrogen, Ur: 413 mg/dL

## 2021-11-06 NOTE — Plan of Care (Signed)
  Problem: Health Behavior/Discharge Planning: Goal: Ability to manage health-related needs will improve Outcome: Progressing   Problem: Clinical Measurements: Goal: Will remain free from infection Outcome: Progressing   Problem: Clinical Measurements: Goal: Diagnostic test results will improve Outcome: Progressing   

## 2021-11-06 NOTE — Evaluation (Signed)
Physical Therapy Evaluation Patient Details Name: [name] MRN: [id number] DOB: 06/23/1945 Today's Date: 11/06/2021  History of Present Illness  Pt is a 76 y/o female admitted secondary to increased weakness. Found to have AKI. Recent admission in early August for CHF. PMH includes CAD s/p stent, DM, HTN, dCHF, CKD.  Clinical Impression  Pt admitted secondary to problem above with deficits below. Pt with increased weakness and required min A for bed mobility and min guard A to stand and take steps to chair. Educated about using RW at home to increase safety. Recommending HHPT to address deficits. Will continue to follow acutely.        Recommendations for follow up therapy are one component of a multi-disciplinary discharge planning process, led by the attending physician.  Recommendations may be updated based on patient status, additional functional criteria and insurance authorization.  Follow Up Recommendations Home health PT      Assistance Recommended at Discharge Intermittent Supervision/Assistance  Patient can return home with the following  Assistance with cooking/housework;Assist for transportation    Equipment Recommendations None recommended by PT  Recommendations for Other Services       Functional Status Assessment Patient has had a recent decline in their functional status and demonstrates the ability to make significant improvements in function in a reasonable and predictable amount of time.     Precautions / Restrictions Precautions Precautions: Fall Restrictions Weight Bearing Restrictions: No      Mobility  Bed Mobility Overal bed mobility: Needs Assistance Bed Mobility: Supine to Sit     Supine to sit: Min assist     General bed mobility comments: Min A for trunk assist to come to sitting    Transfers Overall transfer level: Needs assistance Equipment used: 1 person hand held assist Transfers: Sit to/from Stand, Bed to  chair/wheelchair/BSC Sit to Stand: Min guard Stand pivot transfers: Min guard         General transfer comment: Min guard for safety to stand and take steps from stretcher to recliner. Very slow and guarded. Educated about using RW at home    Ambulation/Gait                  Stairs            Wheelchair Mobility    Modified Rankin (Stroke Patients Only)       Balance Overall balance assessment: Needs assistance Sitting-balance support: Feet supported, No upper extremity supported Sitting balance-Leahy Scale: Fair     Standing balance support: Bilateral upper extremity supported Standing balance-Leahy Scale: Poor Standing balance comment: Reliant on at least 1 UE support                             Pertinent Vitals/Pain Pain Assessment Pain Assessment: Faces Faces Pain Scale: Hurts little more Pain Location: back Pain Descriptors / Indicators: Grimacing, Guarding Pain Intervention(s): Limited activity within patient's tolerance, Monitored during session, Repositioned    Home Living Family/patient expects to be discharged to:: Private residence Living Arrangements: Children Available Help at Discharge: Family;Available PRN/intermittently Type of Home: House Home Access: Level entry     Alternate Level Stairs-Number of Steps: 12 Home Layout: Two level;1/2 bath on main level Home Equipment: Rolling Walker (2 wheels);Rollator (4 wheels);Cane - single point      Prior Function Prior Level of Function : Independent/Modified Independent  Hand Dominance        Extremity/Trunk Assessment   Upper Extremity Assessment Upper Extremity Assessment: LUE deficits/detail LUE Deficits / Details: Limited LUE shoulder ROM    Lower Extremity Assessment Lower Extremity Assessment: Generalized weakness    Cervical / Trunk Assessment Cervical / Trunk Assessment: Normal  Communication   Communication: No difficulties   Cognition Arousal/Alertness: Awake/alert Behavior During Therapy: WFL for tasks assessed/performed, Flat affect Overall Cognitive Status: Within Functional Limits for tasks assessed                                          General Comments      Exercises     Assessment/Plan    PT Assessment Patient needs continued PT services  PT Problem List Decreased strength;Decreased mobility;Pain;Decreased activity tolerance;Decreased balance       PT Treatment Interventions DME instruction;Gait training;Stair training;Functional mobility training;Therapeutic activities;Therapeutic exercise;Patient/family education;Neuromuscular re-education    PT Goals (Current goals can be found in the Care Plan section)  Acute Rehab PT Goals Patient Stated Goal: to get stronger PT Goal Formulation: With patient Time For Goal Achievement: 11/20/21 Potential to Achieve Goals: Good    Frequency Min 3X/week     Co-evaluation               AM-PAC PT "6 Clicks" Mobility  Outcome Measure Help needed turning from your back to your side while in a flat bed without using bedrails?: None Help needed moving from lying on your back to sitting on the side of a flat bed without using bedrails?: A Little Help needed moving to and from a bed to a chair (including a wheelchair)?: A Little Help needed standing up from a chair using your arms (e.g., wheelchair or bedside chair)?: A Little Help needed to walk in hospital room?: A Little Help needed climbing 3-5 steps with a railing? : A Lot 6 Click Score: 18    End of Session   Activity Tolerance: Patient tolerated treatment well Patient left: in chair;with call bell/phone within reach (in recliner in ED) Nurse Communication: Mobility status PT Visit Diagnosis: Unsteadiness on feet (R26.81);Muscle weakness (generalized) (M62.81);Difficulty in walking, not elsewhere classified (R26.2)    Time: 1610-9604 PT Time Calculation (min) (ACUTE  ONLY): 20 min   Charges:   PT Evaluation $PT Eval Moderate Complexity: 1 Mod          Reuel Derby, PT, DPT  Acute Rehabilitation Services  Office: [phone]   Rudean Hitt 11/06/2021, 11:24 AM

## 2021-11-06 NOTE — ED Notes (Signed)
Daughter [name] updated.

## 2021-11-06 NOTE — Consult Note (Signed)
[name] Admit Date: 11/05/2021 11/06/2021 [name] Requesting Physician:  Dwyane Dee MD  Reason for Consult:  AoCKD4 HPI:  10F presented to the ER overnight with abdominal pain, weakness.  PMH includes HFpEF with recent admissions requiring diuresis, CKD4 followed by Dr. Hollie Salk of CKD, hypertension, CAD with history of PCI, history of DVT, history of CVA, DM 2.  Work-up here showing creatinine worsened to 4.25, UA with findings consistent with bladder infection.  Patient admitted, placed on ceftriaxone with cultures pending.  Diuretics held, placed on gentle hydration.  Patient notes more subacute to chronic symptoms including anorexia, nausea.  No edema or dyspnea.  With daughter [name] by phone discussed current status.  Discussed that some of her symptoms might be related to uremia and a creatinine of 4.2 is not terribly high, her BUN is greater than 100 and her degree of sarcopenia means that her GFR is quite low.  Discussed that we might be nearing a time to start dialysis which they are understanding of but hope to avoid.   Creatinine (mg/dL)  Date Value  06/22/2021 3.51 (HH)  12/22/2020 2.66 (H)  05/23/2020 1.84 (H)  12/21/2019 2.51 (H)  07/21/2019 1.62 (H)  06/29/2019 2.55 (H)  01/12/2019 1.74 (H)  03/14/2017 1.8 (H)  12/19/2016 2.3 (H)  06/21/2016 1.8 (H)  12/12/2015 2.4 (H)  06/02/2015 1.8 (H)  12/06/2014 1.7 (H)  07/22/2014 2.0 (H)  03/03/2014 1.3 (H)  11/04/2013 1.8 (H)  08/06/2013 1.8 (H)   Creat (mg/dL)  Date Value  12/17/2013 1.25 (H)  11/09/2013 1.39 (H)   Creatinine, Ser (mg/dL)  Date Value  11/05/2021 4.12 (H)  11/05/2021 4.25 (H)  10/30/2021 3.33 (H)  10/29/2021 3.05 (H)  10/28/2021 2.88 (H)  10/27/2021 2.80 (H)  10/26/2021 2.99 (H)  10/25/2021 2.55 (H)  10/24/2021 2.99 (H)  10/23/2021 3.36 (H)  ] I/Os: No intake/output data recorded.   ROS NSAIDS: no exposure IV Contrast no exposure TMP/SMX no use Hypotension not present Balance  of 12 systems is negative w/ exceptions as above  PMH  Past Medical History:  Diagnosis Date   Abdominal discomfort    Chronic N/V/D. Presumptive dx Crohn's dx per elevated p ANCA. Failed Entocort and Pentasa. Sep 2003 - ileocolectomy c anastomosis per Dr Deon Pilling 2/2 adhesions - path was hegative for Crohns. EGD, Sm bowel follow through (11/03), and an eteroclysis (10/03) were unrevealing. Cuases hypomag and hypocalcemia.   Adnexal mass 8/03   s/p lap BSO (R ovarian fibroma) & lysis of adhesions   Allergy    Seasonal   Anemia    Multifactorial. Baseline HgB 10-11 ish. B12 def - 150 in 3/10. Fe Def - ferritin 35 3/10. Both are being repleted.   Breast cancer ([place]) 03/16/13   right, 5 o'clock   CAD (coronary artery disease) 1996   1996 - PTCA and angioplasty diagonal branch. 2000 - Rotoblator & angiopllasty of diagonal. 2006 - subendocardial AMI, DES to proximal LAD.Marland Kitchen Also had 90% stenosis in distal apical LAD. EF 55 with apical hypokinesis. Indefinite ASA and Plavix.   CHF (congestive heart failure) (HCC)    Chronic kidney disease    Chronic renal insuff baseline Cr 1.2 - 1.4 ish.   Chronic kidney disease, stage III (moderate) (HCC)    Chronic renal insuff baseline Cr 1.2 - 1.4 ish. New baseline about 2 (2018)   Chronic pain    CT 10/10 = Spinal stenosis L2 - S1.   COVID-19 11/02/2020   Diabetes mellitus  Insulin dependent   Diabetes mellitus type 2 in obese ([place]) 05/03/2010   Managed on lantus and novolog. [name] diabetic nephropathy. Metformin D/C'd 2012 2/2 creatinine.  No diabetic retinopathy per 3/11.    Gout    Hx of radiation therapy 06/02/13- 07/16/13   right rbeast 4500 cGy 25 sessions, right breast boost 1600 cGy in 8 sessions   Hyperlipidemia    Managed with both a statin and Welchol. Welchol stopped 2014 2/2 cost and started on fenofibrate    Hypertension    2006 B renal arteries patent. 2003 MRA - no RAS. 2003 pheo W/U Dr Hassell Done reportedly negative.   Hypoxia 07/23/2017    Lupus ([place])    Lymphedema of breast    Personal history of radiation therapy 2015   Pneumonia of left lung due to infectious organism    RBBB    Renal vein thrombosis (HCC)    SBO (small bowel obstruction) ([place]) 09/17/2017   Secondary hyperparathyroidism ([place])    Stroke ([place])    Incidental finding MRI 2002 L lacunar infarct   Vitamin B12 deficiency    Vitamin D deficiency    Wears dentures    top   PSH  Past Surgical History:  Procedure Laterality Date   ABDOMINAL HYSTERECTOMY     BILATERAL SALPINGOOPHORECTOMY  8/03   Lap BSO (R ovarian fibroma) and adhesion lysis   BIOPSY  09/08/2020   Procedure: BIOPSY;  Surgeon: Ronald Lobo, MD;  Location: [facility];  Service: Endoscopy;;   BOWEL RESECTION  2003   ileocolectomy with anastomosis 2/2 adhesions   BREAST BIOPSY Right 2015   BREAST BIOPSY Right 07/2014   BREAST LUMPECTOMY Right 04/22/2013   BREAST LUMPECTOMY WITH NEEDLE LOCALIZATION AND AXILLARY SENTINEL LYMPH NODE BX Right 04/22/2013   Procedure: BREAST LUMPECTOMY WITH NEEDLE LOCALIZATION AND AXILLARY SENTINEL LYMPH NODE BX;  Surgeon: Stark Klein, MD;  Location: [facility];  Service: General;  Laterality: Right;   CARDIAC CATHETERIZATION     2 stents   CHOLECYSTECTOMY     COLONOSCOPY     ESOPHAGOGASTRODUODENOSCOPY N/A 09/08/2020   Procedure: ESOPHAGOGASTRODUODENOSCOPY (EGD);  Surgeon: Ronald Lobo, MD;  Location: [facility];  Service: Endoscopy;  Laterality: N/A;   HEMICOLECTOMY     R sided hemicolectomy   HERNIA REPAIR     Ventral hernia repair   PTCA  4/06   FH  Family History  Problem Relation Age of Onset   Pancreatic cancer Brother 63   Breast cancer Mother 68   Lung cancer Maternal Aunt    Breast cancer Maternal Aunt    Prostate cancer Maternal Uncle    Heart attack Maternal Grandmother    Breast cancer Maternal Grandmother    Colon cancer Maternal Grandmother    Clotting disorder Maternal Grandmother    Diabetes Maternal Grandmother     Kidney failure Maternal Aunt    Hypertension Daughter    SH  reports that she [name] never smoked. She [name] never used smokeless tobacco. She reports that she does not drink alcohol and does not use drugs. Allergies  Allergies  Allergen Reactions   Percocet [Oxycodone-Acetaminophen] Other (See Comments)    headaches   Haloperidol Lactate Nausea And Vomiting   Lorazepam Nausea Only   Morphine Sulfate Other (See Comments)    Agitation   Propoxyphene Hcl Nausea And Vomiting   Tramadol Hcl Nausea And Vomiting   Valium [Diazepam] Other (See Comments)    Agitation    Anesthetics, Halogenated Other (See Comments)  Per patient's daughter, she can not tolerate anesthetics.    Etomidate Other (See Comments)    Heart stopped during anesthesia   Fentanyl Other (See Comments)    Heart stopped during anesthesia    Haloperidol Nausea And Vomiting   Morphine Sulfate Nausea Only   Propoxyphene Nausea And Vomiting   Tramadol Hcl Nausea And Vomiting   Versed [Midazolam] Other (See Comments)    Heart stopped during anesthesia   Home medications Prior to Admission medications   Medication Sig Start Date End Date Taking? Authorizing Provider  acetaminophen (TYLENOL) 500 MG tablet Take 1,000 mg by mouth every 6 (six) hours as needed for moderate pain or headache.   Yes [provider]  amLODipine (NORVASC) 10 MG tablet Take 1/2 tablets (5 mg total) by mouth daily. 10/31/21 11/30/21 Yes Arrien, Jimmy Picket, MD  apixaban (ELIQUIS) 2.5 MG TABS tablet Take 1 tablet (2.5 mg total) by mouth 2 (two) times daily. 10/30/21  Yes Arrien, Jimmy Picket, MD  budesonide (ENTOCORT EC) 3 MG 24 hr capsule Take 1 capsule by mouth once daily Patient taking differently: Take 3 mg by mouth daily. 11/30/20  Yes Gatha Mayer, MD  Cyanocobalamin (B-12 COMPLIANCE INJECTION IJ) Inject 1 vial as directed every 30 (thirty) days.   Yes [provider]  ergocalciferol (VITAMIN D2) 1.25 MG (50000 UT)  capsule Take 1 capsule (50,000 Units total) by mouth once a week. 08/02/20  Yes Truitt Merle, MD  famotidine (PEPCID) 20 MG tablet Take 10 mg by mouth at bedtime. 10/14/21  Yes [provider]  FLUoxetine (PROZAC) 20 MG capsule Take 20 mg by mouth daily. 10/12/21  Yes [provider]  hydrALAZINE (APRESOLINE) 100 MG tablet Take 0.5 tablets (50 mg total) by mouth 2 (two) times daily. 09/24/21  Yes Josue Hector, MD  lipase/protease/amylase (CREON) 36000 UNITS CPEP capsule Take 36,000 Units by mouth 3 (three) times daily with meals.   Yes [provider]  loperamide (IMODIUM A-D) 2 MG tablet Take 4 mg by mouth 4 (four) times daily as needed for diarrhea or loose stools.   Yes [provider]  mirtazapine (REMERON) 7.5 MG tablet Take 1 tablet (7.5 mg total) by mouth at bedtime. 12/22/20  Yes Truitt Merle, MD  nitroGLYCERIN (NITROSTAT) 0.4 MG SL tablet DISSOLVE 1 TABLET UNDER THE TONGUE EVERY 5 MINUTES AS  NEEDED FOR CHEST PAIN. MAX  OF 3 TABLETS IN 15 MINUTES. CALL 911 IF PAIN PERSISTS. Patient taking differently: Place 0.4 mg under the tongue every 5 (five) minutes as needed for chest pain. 10/04/20  Yes Jerline Pain, MD  pantoprazole (PROTONIX) 40 MG tablet TAKE 1 TABLET BY MOUTH  DAILY BEFORE BREAKFAST Patient taking differently: Take 40 mg by mouth daily. 10/24/20  Yes Gatha Mayer, MD  potassium chloride (KLOR-CON) 20 MEQ packet Take 1 tablet (20 mEq) by mouth daily. 10/31/21 11/30/21 Yes Arrien, Jimmy Picket, MD  torsemide (DEMADEX) 20 MG tablet Take 2 tablets (40 mg total) by mouth daily. 10/31/21 11/30/21 Yes Arrien, Jimmy Picket, MD  ondansetron (ZOFRAN-ODT) 4 MG disintegrating tablet DISSOLVE 1 TABLET IN MOUTH EVERY 8 HOURS AS NEEDED FOR NAUSEA OR VOMITING Patient not taking: Reported on 11/05/2021 09/28/21   Truitt Merle, MD    Current Medications Scheduled Meds:  apixaban  2.5 mg Oral BID   budesonide  3 mg Oral Daily   famotidine  10 mg Oral QHS   FLUoxetine   20 mg Oral Daily   lipase/protease/amylase  36,000 Units  Oral TID WC   mirtazapine  7.5 mg Oral QHS   pantoprazole  40 mg Oral Daily   saccharomyces boulardii  250 mg Oral BID   sodium chloride flush  3 mL Intravenous Q12H   Continuous Infusions:  cefTRIAXone (ROCEPHIN)  IV Stopped (11/05/21 1717)   PRN Meds:.acetaminophen **OR** acetaminophen, albuterol, loperamide  CBC Recent Labs  Lab 11/05/21 0914 11/06/21 0357  WBC 12.3* 11.7*  HGB 9.2* 8.3*  HCT 29.0* 27.1*  MCV 88.1 89.4  PLT 274 327   Basic Metabolic Panel Recent Labs  Lab 11/05/21 0914 11/05/21 1515  NA 139 141  K 3.2* 3.6  CL 104 107  CO2 22 23  GLUCOSE 132* 118*  BUN 122* 120*  CREATININE 4.25* 4.12*  CALCIUM 9.1 9.3  PHOS  --  4.1    Physical Exam   Blood pressure (!) 151/59, pulse 82, temperature 98.9 F (37.2 C), resp. rate 10, SpO2 100 %. GEN: NAD, sitting in chair ENT: NCAT EYES: EOMI CV: Regular, normal S1 and S2, no rub PULM: Clear bilaterally, diminished in the bases ABD: Soft, nontender SKIN: No rashes or lesions EXT: No peripheral edema NEURO: No asterixis  Assessment 38F AoCKD4, recent HFpEF exacerbation, UTI, ? Uremia  AoCKD4 Follows with Hollie Salk at [facility] Perhaps some overdiuresis, hydrating, will see how she responds I think probably having some uremic symptoms as well, have broached the idea of dialysis, discuss again tomorrow UTI on Ceftriaxone per [facility] HFpEF, not decompensated DM2 CAD/hx/o PCI  Plan As above, hydrate, reassess in AM Might require HD Daily weights, Daily Renal Panel, Strict I/Os, Avoid nephrotoxins (NSAIDs, judicious IV Contrast)   [name]  11/06/2021, 3:31 PM

## 2021-11-06 NOTE — ED Notes (Signed)
ED TO INPATIENT HANDOFF REPORT  ED Nurse Name and Phone #: [phone]  S Name/Age/Gender [name] 76 y.o. female Room/Bed: 012C/012C  Code Status   Code Status: Full Code  Home/SNF/Other Home Patient oriented to: self, place, time, and situation Is this baseline? Yes   Triage Complete: Triage complete  Chief Complaint Acute kidney injury superimposed on chronic kidney disease ([place]) [N17.9, N18.9]  Triage Note Patient reports that she developed shortness of breath with some right arm pain last night. Just had recent admission to the hospital for chf. Denies chest pain. Speaking in complete sentences, no cough, no fever   Allergies Allergies  Allergen Reactions   Percocet [Oxycodone-Acetaminophen] Other (See Comments)    headaches   Haloperidol Lactate Nausea And Vomiting   Lorazepam Nausea Only   Morphine Sulfate Other (See Comments)    Agitation   Propoxyphene Hcl Nausea And Vomiting   Tramadol Hcl Nausea And Vomiting   Valium [Diazepam] Other (See Comments)    Agitation    Anesthetics, Halogenated Other (See Comments)    Per patient's daughter, she can not tolerate anesthetics.    Etomidate Other (See Comments)    Heart stopped during anesthesia   Fentanyl Other (See Comments)    Heart stopped during anesthesia    Haloperidol Nausea And Vomiting   Morphine Sulfate Nausea Only   Propoxyphene Nausea And Vomiting   Tramadol Hcl Nausea And Vomiting   Versed [Midazolam] Other (See Comments)    Heart stopped during anesthesia    Level of Care/Admitting Diagnosis ED Disposition     ED Disposition  Admit   Condition  --   [place]: [place] [[id number]]  Level of Care: Telemetry Medical [[id number]]  May admit patient to [facility] or [facility] if equivalent level of care is available:: No  Covid Evaluation: Asymptomatic - no recent exposure (last 10 days) testing not required  Diagnosis: Acute kidney injury superimposed on  chronic kidney disease [facility]) [[id number]]  Admitting Physician: Norval Morton [[id number]]  Attending Physician: Norval Morton [[id number]]  Certification:: I certify this patient will need inpatient services for at least 2 midnights  Estimated Length of Stay: 4          B Medical/Surgery History Past Medical History:  Diagnosis Date   Abdominal discomfort    Chronic N/V/D. Presumptive dx Crohn's dx per elevated p ANCA. Failed Entocort and Pentasa. Sep 2003 - ileocolectomy c anastomosis per Dr Deon Pilling 2/2 adhesions - path was hegative for Crohns. EGD, Sm bowel follow through (11/03), and an eteroclysis (10/03) were unrevealing. Cuases hypomag and hypocalcemia.   Adnexal mass 8/03   s/p lap BSO (R ovarian fibroma) & lysis of adhesions   Allergy    Seasonal   Anemia    Multifactorial. Baseline HgB 10-11 ish. B12 def - 150 in 3/10. Fe Def - ferritin 35 3/10. Both are being repleted.   Breast cancer ([place]) 03/16/13   right, 5 o'clock   CAD (coronary artery disease) 1996   1996 - PTCA and angioplasty diagonal branch. 2000 - Rotoblator & angiopllasty of diagonal. 2006 - subendocardial AMI, DES to proximal LAD.Marland Kitchen Also had 90% stenosis in distal apical LAD. EF 55 with apical hypokinesis. Indefinite ASA and Plavix.   CHF (congestive heart failure) (HCC)    Chronic kidney disease    Chronic renal insuff baseline Cr 1.2 - 1.4 ish.   Chronic kidney disease, stage III (moderate) (HCC)    Chronic renal insuff  baseline Cr 1.2 - 1.4 ish. New baseline about 2 (2018)   Chronic pain    CT 10/10 = Spinal stenosis L2 - S1.   COVID-19 11/02/2020   Diabetes mellitus    Insulin dependent   Diabetes mellitus type 2 in obese ([place]) 05/03/2010   Managed on lantus and novolog. Has diabetic nephropathy. Metformin D/C'd 2012 2/2 creatinine.  No diabetic retinopathy per 3/11.    Gout    Hx of radiation therapy 06/02/13- 07/16/13   right rbeast 4500 cGy 25 sessions, right breast boost 1600 cGy in 8 sessions    Hyperlipidemia    Managed with both a statin and Welchol. Welchol stopped 2014 2/2 cost and started on fenofibrate    Hypertension    2006 B renal arteries patent. 2003 MRA - no RAS. 2003 pheo W/U Dr Hassell Done reportedly negative.   Hypoxia 07/23/2017   Lupus ([place])    Lymphedema of breast    Personal history of radiation therapy 2015   Pneumonia of left lung due to infectious organism    RBBB    Renal vein thrombosis (HCC)    SBO (small bowel obstruction) ([place]) 09/17/2017   Secondary hyperparathyroidism ([place])    Stroke ([place])    Incidental finding MRI 2002 L lacunar infarct   Vitamin B12 deficiency    Vitamin D deficiency    Wears dentures    top   Past Surgical History:  Procedure Laterality Date   ABDOMINAL HYSTERECTOMY     BILATERAL SALPINGOOPHORECTOMY  8/03   Lap BSO (R ovarian fibroma) and adhesion lysis   BIOPSY  09/08/2020   Procedure: BIOPSY;  Surgeon: Ronald Lobo, MD;  Location: [facility];  Service: Endoscopy;;   BOWEL RESECTION  2003   ileocolectomy with anastomosis 2/2 adhesions   BREAST BIOPSY Right 2015   BREAST BIOPSY Right 07/2014   BREAST LUMPECTOMY Right 04/22/2013   BREAST LUMPECTOMY WITH NEEDLE LOCALIZATION AND AXILLARY SENTINEL LYMPH NODE BX Right 04/22/2013   Procedure: BREAST LUMPECTOMY WITH NEEDLE LOCALIZATION AND AXILLARY SENTINEL LYMPH NODE BX;  Surgeon: Stark Klein, MD;  Location: [facility];  Service: General;  Laterality: Right;   CARDIAC CATHETERIZATION     2 stents   CHOLECYSTECTOMY     COLONOSCOPY     ESOPHAGOGASTRODUODENOSCOPY N/A 09/08/2020   Procedure: ESOPHAGOGASTRODUODENOSCOPY (EGD);  Surgeon: Ronald Lobo, MD;  Location: [facility];  Service: Endoscopy;  Laterality: N/A;   HEMICOLECTOMY     R sided hemicolectomy   HERNIA REPAIR     Ventral hernia repair   PTCA  4/06     A IV Location/Drains/Wounds Patient Lines/Drains/Airways Status     Active Line/Drains/Airways     Name Placement date Placement time Site Days    Peripheral IV 11/06/21 20 G 1" Anterior;Left;Proximal Forearm 11/06/21  1118  Forearm  less than 1            Intake/Output Last 24 hours No intake or output data in the 24 hours ending 11/06/21 1431  Labs/Imaging Results for orders placed or performed during the hospital encounter of 11/05/21 (from the past 48 hour(s))  Basic metabolic panel     Status: Abnormal   Collection Time: 11/05/21  9:14 AM  Result Value Ref Range   Sodium 139 135 - 145 mmol/L   Potassium 3.2 (L) 3.5 - 5.1 mmol/L   Chloride 104 98 - 111 mmol/L   CO2 22 22 - 32 mmol/L   Glucose, Bld 132 (H) 70 - 99 mg/dL  Comment: Glucose reference range applies only to samples taken after fasting for at least 8 hours.   BUN 122 (H) 8 - 23 mg/dL   Creatinine, Ser 4.25 (H) 0.44 - 1.00 mg/dL   Calcium 9.1 8.9 - 10.3 mg/dL   GFR, Estimated 10 (L) >60 mL/min    Comment: (NOTE) Calculated using the CKD-EPI Creatinine Equation (2021)    Anion gap 13 5 - 15    Comment: Performed at [facility] [address], [place], [place] [address]  CBC     Status: Abnormal   Collection Time: 11/05/21  9:14 AM  Result Value Ref Range   WBC 12.3 (H) 4.0 - 10.5 K/uL   RBC 3.29 (L) 3.87 - 5.11 MIL/uL   Hemoglobin 9.2 (L) 12.0 - 15.0 g/dL   HCT 29.0 (L) 36.0 - 46.0 %   MCV 88.1 80.0 - 100.0 fL   MCH 28.0 26.0 - 34.0 pg   MCHC 31.7 30.0 - 36.0 g/dL   RDW 18.6 (H) 11.5 - 15.5 %   Platelets 274 150 - 400 K/uL   nRBC 0.0 0.0 - 0.2 %    Comment: Performed at [facility], [place] [address], [place], [place] [address]  Urinalysis, Routine w reflex microscopic Urine, Clean Catch     Status: Abnormal   Collection Time: 11/05/21 11:49 AM  Result Value Ref Range   Color, Urine YELLOW YELLOW   APPearance CLEAR CLEAR   Specific Gravity, Urine 1.010 1.005 - 1.030   pH 5.5 5.0 - 8.0   Glucose, UA NEGATIVE NEGATIVE mg/dL   Hgb urine dipstick NEGATIVE NEGATIVE   Bilirubin Urine NEGATIVE NEGATIVE   Ketones, ur NEGATIVE NEGATIVE  mg/dL   Protein, ur NEGATIVE NEGATIVE mg/dL   Nitrite NEGATIVE NEGATIVE   Leukocytes,Ua SMALL (A) NEGATIVE    Comment: Performed at [facility] [address] [place], [place] [address]  Urinalysis, Microscopic (reflex)     Status: Abnormal   Collection Time: 11/05/21 11:49 AM  Result Value Ref Range   RBC / HPF 11-20 0 - 5 RBC/hpf   WBC, UA 6-10 0 - 5 WBC/hpf   Bacteria, UA MANY (A) NONE SEEN   Squamous Epithelial / LPF 0-5 0 - 5   Mucus PRESENT    Budding Yeast PRESENT    Hyaline Casts, UA PRESENT     Comment: Performed at [facility], [place] [address], [place], [place] [address]  Creatinine, urine, random     Status: None   Collection Time: 11/05/21 12:07 PM  Result Value Ref Range   Creatinine, Urine 68 mg/dL    Comment: Performed at [facility] [address] [place], [place] [address]  Sodium, urine, random     Status: None   Collection Time: 11/05/21 12:07 PM  Result Value Ref Range   Sodium, Ur 49 mmol/L    Comment: Performed at [facility] [address], [place], [place] [address]  Urea nitrogen, urine     Status: None   Collection Time: 11/05/21 12:07 PM  Result Value Ref Range   Urea Nitrogen, Ur 413 Not Estab. mg/dL    Comment: (NOTE) Performed At: [facility] [place], [place] [id number] Rush Farmer MD [id number]   Protime-INR     Status: Abnormal   Collection Time: 11/05/21  3:14 PM  Result Value Ref Range   Prothrombin Time 15.7 (H) 11.4 - 15.2 seconds   INR 1.3 (  H) 0.8 - 1.2    Comment: (NOTE) INR goal varies based on device and disease states. Performed at [facility], [place] [address] [place], [place] [address]   Renal function panel     Status: Abnormal   Collection Time: 11/05/21  3:15 PM  Result Value Ref Range   Sodium 141 135 - 145 mmol/L   Potassium 3.6 3.5 - 5.1 mmol/L   Chloride 107 98 - 111 mmol/L   CO2 23 22 - 32 mmol/L   Glucose, Bld 118 (H) 70 - 99 mg/dL    Comment:  Glucose reference range applies only to samples taken after fasting for at least 8 hours.   BUN 120 (H) 8 - 23 mg/dL   Creatinine, Ser 4.12 (H) 0.44 - 1.00 mg/dL   Calcium 9.3 8.9 - 10.3 mg/dL   Phosphorus 4.1 2.5 - 4.6 mg/dL   Albumin 3.1 (L) 3.5 - 5.0 g/dL   GFR, Estimated 11 (L) >60 mL/min    Comment: (NOTE) Calculated using the CKD-EPI Creatinine Equation (2021)    Anion gap 11 5 - 15    Comment: Performed at [facility] [address] [place], [place] [address]  CBC     Status: Abnormal   Collection Time: 11/06/21  3:57 AM  Result Value Ref Range   WBC 11.7 (H) 4.0 - 10.5 K/uL   RBC 3.03 (L) 3.87 - 5.11 MIL/uL   Hemoglobin 8.3 (L) 12.0 - 15.0 g/dL   HCT 27.1 (L) 36.0 - 46.0 %   MCV 89.4 80.0 - 100.0 fL   MCH 27.4 26.0 - 34.0 pg   MCHC 30.6 30.0 - 36.0 g/dL   RDW 18.9 (H) 11.5 - 15.5 %   Platelets 245 150 - 400 K/uL   nRBC 0.0 0.0 - 0.2 %    Comment: Performed at [facility], [place] [address], [place], [place] [address]   *Note: Due to a large number of results and/or encounters for the requested time period, some results have not been displayed. A complete set of results can be found in Results Review.   DG Chest 2 View  Result Date: 11/05/2021 CLINICAL DATA:  Shortness of breath. EXAM: CHEST - 2 VIEW COMPARISON:  10/23/2021 FINDINGS: The lungs are clear without focal pneumonia, edema, pneumothorax or pleural effusion. The cardiopericardial silhouette is within normal limits for size. The visualized bony structures of the thorax are unremarkable. IMPRESSION: No active cardiopulmonary disease. Electronically Signed   By: Misty Stanley M.D.   On: 11/05/2021 09:46    Pending Labs Unresulted Labs (From admission, onward)     Start     Ordered   11/07/21 0500  CBC  Tomorrow morning,   R       Question:  Specimen collection method  Answer:  IV Team=IV Team collect   11/06/21 1131   11/07/21 3532  Basic metabolic panel  Tomorrow morning,   R       Question:   Specimen collection method  Answer:  IV Team=IV Team collect   11/06/21 1131   11/05/21 1551  Urine Culture  (Urine Culture)  Once,   R       Question:  Indication  Answer:  Urgency/frequency   11/05/21 1550            Vitals/Pain Today's Vitals   11/06/21 0800 11/06/21 1023 11/06/21 1135 11/06/21 1221  BP: (!) 127/57  122/76   Pulse: 83  80   Resp:  14  13   Temp: 98.1 F (36.7 C)   98.9 F (37.2 C)  TempSrc: Oral     SpO2: 98%  100%   PainSc:  8       Isolation Precautions No active isolations  Medications Medications  apixaban (ELIQUIS) tablet 2.5 mg (2.5 mg Oral Given 11/06/21 1016)  sodium chloride flush (NS) 0.9 % injection 3 mL (3 mLs Intravenous Not Given 11/06/21 1023)  acetaminophen (TYLENOL) tablet 650 mg (650 mg Oral Given 11/06/21 1023)    Or  acetaminophen (TYLENOL) suppository 650 mg ( Rectal See Alternative 11/06/21 1023)  albuterol (PROVENTIL) (2.5 MG/3ML) 0.083% nebulizer solution 2.5 mg (has no administration in time range)  0.9 %  sodium chloride infusion ( Intravenous Rate/Dose Verify 11/05/21 2322)  cefTRIAXone (ROCEPHIN) 1 g in sodium chloride 0.9 % 100 mL IVPB (0 g Intravenous Stopped 11/05/21 1717)  FLUoxetine (PROZAC) capsule 20 mg (20 mg Oral Given 11/06/21 1016)  mirtazapine (REMERON) tablet 7.5 mg (7.5 mg Oral Given 11/05/21 2125)  budesonide (ENTOCORT EC) 24 hr capsule 3 mg (3 mg Oral Given 11/06/21 1017)  famotidine (PEPCID) tablet 10 mg (10 mg Oral Given 11/05/21 2125)  lipase/protease/amylase (CREON) capsule 36,000 Units (36,000 Units Oral Given 11/06/21 1016)  loperamide (IMODIUM) capsule 4 mg (4 mg Oral Given 11/06/21 1418)  pantoprazole (PROTONIX) EC tablet 40 mg (40 mg Oral Given 11/06/21 1016)  saccharomyces boulardii (FLORASTOR) capsule 250 mg (250 mg Oral Given 11/06/21 1017)  sodium chloride 0.9 % bolus 500 mL (0 mLs Intravenous Stopped 11/05/21 1700)  potassium chloride (KLOR-CON M) CR tablet 30 mEq (30 mEq Oral Given 11/05/21 1247)   acetaminophen (TYLENOL) tablet 1,000 mg (1,000 mg Oral Given 11/05/21 2125)    Mobility walks Low fall risk   R Recommendations: See Admitting Provider Note  Report given to: Mable Paris  Additional Notes:

## 2021-11-06 NOTE — Progress Notes (Signed)
PROGRESS NOTE    [name]  VZD:[id number] DOB: 1945/12/07 DOA: 11/05/2021  PCP: Seward Carol, MD   Brief Narrative: This 76 years old female with PMH significant for HTN, CKD stage IV, chronic diastolic CHF, CAD s/p stents, DVT, CVA, DM type II, and chronic gastritis who presented in the ED with generalized weakness.  Patient was admitted twice in the hospital for acute on chronic diastolic CHF exacerbation and was treated with furosemide.  Prior to last discharge her creatinine was 3.3, as per patient after she is discharged she was doing fine.  She has held her diuretics in last 3 days due to rising kidney functions.  Patient report feeling very weak associated with increased urinary frequency, intermittent substernal chest pain, was not able to get out of bed.  Work-up in the ED reveals white cell count 12.3, Hb 9.2, serum creatinine 4.25.  UA: Small LE, many bacteria, WBC+.  Patient is admitted for further evaluation and management.  Assessment & Plan:   Principal Problem:   Acute kidney injury superimposed on chronic kidney disease (HCC) Active Problems:   UTI (urinary tract infection)   Leukocytosis   Weakness   Nausea & vomiting   Hypokalemia   Benign essential HTN   Coronary artery disease involving native coronary artery of native heart   Anemia in chronic kidney disease   Chronic diastolic CHF (congestive heart failure) (HCC)   GERD without esophagitis   Type 2 diabetes mellitus with hyperlipidemia (HCC)   History of DVT (deep vein thrombosis)  AKI on CKD stage IV: Patient presented with creatinine elevated up to 4.25,  Prior to discharge her serum creatinine was 3.33, likely due to overdiuresis. Patient follows up with Nephrology in the outpatient setting. Continue IV hydration, avoid nephrotoxic medications. Continue to monitor serum creatinine, Nephrology is consulted.  Urinary tract infection: Patient reported increased urinary frequency, nocturia and  burning. UA+ LE, WBC, bacteria, follow-up urine culture. Continue IV ceftriaxone.   Leukocytosis: Likely due to UTI.  Patient denies any fever. Chest x-ray is unremarkable.   Generalized weakness: Patient presented with worsening weakness likely due to overdiuresis. Continue IV hydration, no focal weakness. PT and OT evaluation   Nausea vomiting and diarrhea: Patient reports intermittent episodes of nausea and vomiting She also reports intermittent loose stools. Monitor intake output charting.  She reports diarrhea has improved. Continue IV Zofran as needed for nausea and vomiting   Hypokalemia: Replaced and resolved.  Coronary artery disease: Patient reports intermittent chest discomfort. EKG: unchanged as compared with prior EKG. Echo 10/24/2021 did not reveal any acute change in heart function  Essential hypertension: Hold amlodipine and hydralazine since blood pressure is on soft side.  Iron deficiency anemia /Anemia of chronic disease. Hemoglobin 9.1 which is around his baseline. Hb down to 8.3 could be due to IV hydration. No signs of any obvious bleeding noted  Chronic diastolic CHF: Patient does not appear to be acutely fluid overloaded at this time. Recent echo shows LVEF 60 to 65%. Will hold IV diuresis at this time.  Type 2 diabetes: Last HbA1c 5.5 in August 23. Continue carb modified diet.   History of DVT, renal vein thrombosis Continue Eliquis   Chronic gastritis GERD Continue budesonide, Creon, Protonix, Pepcid   DVT prophylaxis: Eliquis Code Status: Full code Family Communication: No family at bed side. Disposition Plan:   Status is: Inpatient Remains inpatient appropriate because: Admitted for AKI on CKD stage IV, nephrology is consulted, continuing on IV hydration.  Monitor  serum creatinine  Anticipated discharge home in 1 to 2 days.   Consultants:  Nephrology  Procedures: None  Antimicrobials: Ceftriaxone  Subjective: Patient was  seen and examined at bedside.   She reports doing better,  she denies any pain, chest pain,  reports feeling much improved.  Objective: Vitals:   11/06/21 0445 11/06/21 0500 11/06/21 0700 11/06/21 0800  BP:  (!) 105/49 (!) 126/56 (!) 127/57  Pulse: 76 71 71 83  Resp: 15 15 14 14   Temp:    98.1 F (36.7 C)  TempSrc:    Oral  SpO2: 98% 97% 99% 98%   No intake or output data in the 24 hours ending 11/06/21 1128 There were no vitals filed for this visit.  Examination:  General exam: Appears comfortable, not in any acute distress.  Deconditioned Respiratory system: CTA bilaterally, respiratory effort normal, RR 15 Cardiovascular system: S1 & S2 heard, regular rate and rhythm, no murmur.   Gastrointestinal system: Abdomen is soft, non tender, non distended, BS+ Central nervous system: Alert and oriented x 3. No focal neurological deficits. Extremities: No edema, no cyanosis, no clubbing Skin: No rashes, lesions or ulcers Psychiatry: Judgement and insight appear normal. Mood & affect appropriate.     Data Reviewed: I have personally reviewed following labs and imaging studies  CBC: Recent Labs  Lab 11/05/21 0914 11/06/21 0357  WBC 12.3* 11.7*  HGB 9.2* 8.3*  HCT 29.0* 27.1*  MCV 88.1 89.4  PLT 274 321   Basic Metabolic Panel: Recent Labs  Lab 11/05/21 0914 11/05/21 1515  NA 139 141  K 3.2* 3.6  CL 104 107  CO2 22 23  GLUCOSE 132* 118*  BUN 122* 120*  CREATININE 4.25* 4.12*  CALCIUM 9.1 9.3  PHOS  --  4.1   GFR: Estimated Creatinine Clearance: 10.3 mL/min (A) (by C-G formula based on SCr of 4.12 mg/dL (H)). Liver Function Tests: Recent Labs  Lab 11/05/21 1515  ALBUMIN 3.1*   No results for input(s): "LIPASE", "AMYLASE" in the last 168 hours. No results for input(s): "AMMONIA" in the last 168 hours. Coagulation Profile: Recent Labs  Lab 11/05/21 1514  INR 1.3*   Cardiac Enzymes: No results for input(s): "CKTOTAL", "CKMB", "CKMBINDEX", "TROPONINI" in  the last 168 hours. BNP (last 3 results) No results for input(s): "PROBNP" in the last 8760 hours. HbA1C: No results for input(s): "HGBA1C" in the last 72 hours. CBG: No results for input(s): "GLUCAP" in the last 168 hours. Lipid Profile: No results for input(s): "CHOL", "HDL", "LDLCALC", "TRIG", "CHOLHDL", "LDLDIRECT" in the last 72 hours. Thyroid Function Tests: No results for input(s): "TSH", "T4TOTAL", "FREET4", "T3FREE", "THYROIDAB" in the last 72 hours. Anemia Panel: No results for input(s): "VITAMINB12", "FOLATE", "FERRITIN", "TIBC", "IRON", "RETICCTPCT" in the last 72 hours. Sepsis Labs: No results for input(s): "PROCALCITON", "LATICACIDVEN" in the last 168 hours.  No results found for this or any previous visit (from the past 240 hour(s)).   Radiology Studies: DG Chest 2 View  Result Date: 11/05/2021 CLINICAL DATA:  Shortness of breath. EXAM: CHEST - 2 VIEW COMPARISON:  10/23/2021 FINDINGS: The lungs are clear without focal pneumonia, edema, pneumothorax or pleural effusion. The cardiopericardial silhouette is within normal limits for size. The visualized bony structures of the thorax are unremarkable. IMPRESSION: No active cardiopulmonary disease. Electronically Signed   By: Misty Stanley M.D.   On: 11/05/2021 09:46    Scheduled Meds:  apixaban  2.5 mg Oral BID   budesonide  3 mg Oral Daily  famotidine  10 mg Oral QHS   FLUoxetine  20 mg Oral Daily   lipase/protease/amylase  36,000 Units Oral TID WC   mirtazapine  7.5 mg Oral QHS   pantoprazole  40 mg Oral Daily   saccharomyces boulardii  250 mg Oral BID   sodium chloride flush  3 mL Intravenous Q12H   Continuous Infusions:  cefTRIAXone (ROCEPHIN)  IV Stopped (11/05/21 1717)     LOS: 1 day    Time spent: 50 mins    Raghad Lorenz, MD [facility]   If 7PM-7AM, please contact night-coverage

## 2021-11-07 ENCOUNTER — Ambulatory Visit: Payer: Medicare Other | Admitting: Physician Assistant

## 2021-11-07 ENCOUNTER — Other Ambulatory Visit: Payer: Medicare Other

## 2021-11-07 DIAGNOSIS — N189 Chronic kidney disease, unspecified: Secondary | ICD-10-CM | POA: Diagnosis not present

## 2021-11-07 DIAGNOSIS — N179 Acute kidney failure, unspecified: Secondary | ICD-10-CM | POA: Diagnosis not present

## 2021-11-07 LAB — BASIC METABOLIC PANEL
Anion gap: 11 (ref 5–15)
BUN: 93 mg/dL — ABNORMAL HIGH (ref 8–23)
CO2: 21 mmol/L — ABNORMAL LOW (ref 22–32)
Calcium: 9.1 mg/dL (ref 8.9–10.3)
Chloride: 114 mmol/L — ABNORMAL HIGH (ref 98–111)
Creatinine, Ser: 2.64 mg/dL — ABNORMAL HIGH (ref 0.44–1.00)
GFR, Estimated: 18 mL/min — ABNORMAL LOW (ref >60)
Glucose, Bld: 93 mg/dL (ref 70–99)
Potassium: 3.8 mmol/L (ref 3.5–5.1)
Sodium: 146 mmol/L — ABNORMAL HIGH (ref 135–145)

## 2021-11-07 LAB — CBC
HCT: 27.1 % — ABNORMAL LOW (ref 36.0–46.0)
Hemoglobin: 8.2 g/dL — ABNORMAL LOW (ref 12.0–15.0)
MCH: 27.1 pg (ref 26.0–34.0)
MCHC: 30.3 g/dL (ref 30.0–36.0)
MCV: 89.4 fL (ref 80.0–100.0)
Platelets: 243 10*3/uL (ref 150–400)
RBC: 3.03 MIL/uL — ABNORMAL LOW (ref 3.87–5.11)
RDW: 18.7 % — ABNORMAL HIGH (ref 11.5–15.5)
WBC: 9.2 10*3/uL (ref 4.0–10.5)
nRBC: 0 % (ref 0.0–0.2)

## 2021-11-07 LAB — URINE CULTURE: Culture: 60000 — AB

## 2021-11-07 NOTE — Progress Notes (Signed)
Physical Therapy Treatment Patient Details Name: [name] MRN: [id number] DOB: 1945-09-20 Today's Date: 11/07/2021   History of Present Illness Pt is a 76 y/o female admitted secondary to increased weakness. Found to have AKI. Recent admission in early August for CHF. PMH includes CVA, CAD s/p stent, DM, HTN, dCHF, CKD.    PT Comments    Pt received in chair, agreeable to therapy session with good participation and tolerance for transfer, gait and stair training. Pt with multiple loss of balance needing up to minA for correction for gait progression in hallway without AD and recommend she utilize RW or 4WW at home initially for improved balance and as pt c/o LLE pain today. Pt ascended/descended flight of steps with R rail per home setup and no loss of balance, needing min safety cues only. Pt continues to benefit from PT services to progress toward functional mobility goals. Continue to recommend HHPT as pt remains at an increased risk of falls with LLE pain and loss of balance during session.  Recommendations for follow up therapy are one component of a multi-disciplinary discharge planning process, led by the attending physician.  Recommendations may be updated based on patient status, additional functional criteria and insurance authorization.  Follow Up Recommendations  Home health PT     Assistance Recommended at Discharge Intermittent Supervision/Assistance  Patient can return home with the following Assistance with cooking/housework;Assist for transportation   Equipment Recommendations  None recommended by PT (pt has RW, cane, 4WW)    Recommendations for Other Services       Precautions / Restrictions Precautions Precautions: Fall Restrictions Weight Bearing Restrictions: No     Mobility  Bed Mobility Overal bed mobility: Needs Assistance             General bed mobility comments: pt received up in recliner/remains up in chair    Transfers Overall  transfer level: Needs assistance Equipment used: None Transfers: Sit to/from Stand, Bed to chair/wheelchair/BSC Sit to Stand: Supervision, Min guard           General transfer comment: cues for safe UE placement; min guard for stand>sit as pt sitting without reaching back and chair sliding away from her as she sits    Ambulation/Gait Ambulation/Gait assistance: Min guard, Min assist Gait Distance (Feet): 300 Feet Assistive device: None Gait Pattern/deviations: Step-through pattern, Decreased stride length, Antalgic, Staggering left, Staggering right       General Gait Details: multiple lateral LOB to L/R sides (~3) needing up to minA to correct balance with gait belt, otherwise min guard for safety. Recommend pt utilize cane or RW at home due to increased fall risk with LOB.   Stairs Stairs: Yes Stairs assistance: Min guard Stair Management: One rail Right, Forwards, Step to pattern, Alternating pattern Number of Stairs: 12 General stair comments: Cues for safe sequencing due to LLE pain, pt with decreased carryover of instructions on ascending but improved technique with descending. no overt LOB.      Balance Overall balance assessment: Needs assistance Sitting-balance support: Feet supported, No upper extremity supported Sitting balance-Leahy Scale: Fair     Standing balance support: Bilateral upper extremity supported Standing balance-Leahy Scale: Poor Standing balance comment: Reliant on at least 1 UE support                            Cognition Arousal/Alertness: Awake/alert Behavior During Therapy: WFL for tasks assessed/performed, Flat affect Overall Cognitive Status: Within Functional Limits  for tasks assessed                                          Exercises Other Exercises Other Exercises: seated BLE AROM: hip flexion, LAQ with 5 sec hold, ankle pumps x10 reps ea Other Exercises: reviewed standing exercises to be performed  with supervision in room/at home and supine/sidelying exercises pt can perform at home independently with handout to reinforce Other Exercises: pt given "Falls at [place]" as per chart review she is refusing HHPT and encouraged her to consider reviewing list at home in case modifications need to be made to reduce her fall risk.    General Comments General comments (skin integrity, edema, etc.): SpO2 WFL and HR 80's bpm on RA and no acute s/sx distress other than LLE pain      Pertinent Vitals/Pain Pain Assessment Pain Assessment: Faces Faces Pain Scale: Hurts little more Pain Location: back and LLE Pain Descriptors / Indicators: Discomfort Pain Intervention(s): Monitored during session, Repositioned (pt defers ice pack)           PT Goals (current goals can now be found in the care plan section) Acute Rehab PT Goals Patient Stated Goal: to get stronger PT Goal Formulation: With patient Time For Goal Achievement: 11/20/21 Progress towards PT goals: Progressing toward goals    Frequency    Min 3X/week      PT Plan Current plan remains appropriate       AM-PAC PT "6 Clicks" Mobility   Outcome Measure  Help needed turning from your back to your side while in a flat bed without using bedrails?: None Help needed moving from lying on your back to sitting on the side of a flat bed without using bedrails?: A Little Help needed moving to and from a bed to a chair (including a wheelchair)?: A Little Help needed standing up from a chair using your arms (e.g., wheelchair or bedside chair)?: A Little Help needed to walk in hospital room?: A Little Help needed climbing 3-5 steps with a railing? : A Little 6 Click Score: 19    End of Session Equipment Utilized During Treatment: Gait belt Activity Tolerance: Patient tolerated treatment well Patient left: in chair;with call bell/phone within reach (per RN OK to remain with no chair alarm, no loss of balance in room, pt  encouraged to use call bell for mobility OOB for safety) Nurse Communication: Mobility status;Other (comment) (lateral loss of balance in hallway, pt L hip pain) PT Visit Diagnosis: Unsteadiness on feet (R26.81);Muscle weakness (generalized) (M62.81);Difficulty in walking, not elsewhere classified (R26.2)     Time: 1454-1510 PT Time Calculation (min) (ACUTE ONLY): 16 min  Charges:  $Gait Training: 8-22 mins                     Katie Dorner P., PTA Acute Rehabilitation Services Secure Chat Preferred 9a-5:30pm Office: [place] 11/07/2021, 4:21 PM

## 2021-11-07 NOTE — Progress Notes (Signed)
Admit: 11/05/2021 LOS: 2  58F AoCKD4, recent HFpEF exacerbation, UTI, ? Uremia  Subjective:  [name] stable on RA No UOP reported SCr improved considerably with hydration, BUN as well Home dosing torsemide 36m po QAM Good appetite, no WOB, No [name]  No intake/output data recorded.  There were no vitals filed for this visit.  Scheduled Meds:  apixaban  2.5 mg Oral BID   budesonide  3 mg Oral Daily   famotidine  10 mg Oral QHS   FLUoxetine  20 mg Oral Daily   lipase/protease/amylase  36,000 Units Oral TID WC   mirtazapine  7.5 mg Oral QHS   pantoprazole  40 mg Oral Daily   saccharomyces boulardii  250 mg Oral BID   sodium chloride flush  3 mL Intravenous Q12H   Continuous Infusions:  cefTRIAXone (ROCEPHIN)  IV 1 g (11/06/21 2001)   PRN Meds:.acetaminophen **OR** acetaminophen, albuterol, loperamide  Current Labs: reviewed    Physical Exam:  Blood pressure (!) 141/59, pulse 85, temperature 98.6 F (37 C), temperature source Oral, resp. rate 16, SpO2 100 %. GEN: NAD, sitting in chair ENT: NCAT EYES: EOMI CV: Regular, normal S1 and S2, no rub PULM: Clear bilaterally, diminished in the bases ABD: Soft, nontender SKIN: No rashes or lesions EXT: No peripheral edema NEURO: No asterixis   A AoCKD4 Follows with [name] at [name] Rapid improvement in SCr / GFR with hydration, she was overdiuresed WIll not req HD during this admission WIll req close f/u with [name] and Cardiology UTI on Ceftriaxone per [facility]; Cx pending HFpEF, not decompensated; follows with [name] DM2 CAD/hx/o PCI  P Hold IVFs for today Oral hydrate If stable ok for DC tomorrow, probably will start low dose diuretics upon discharge and arrange close f/u Daily weights, Daily Renal Panel, Strict I/Os, Avoid nephrotoxins (NSAIDs, judicious IV Contrast)    [name] GrippeMD 11/07/2021, 12:37 PM  Recent Labs  Lab 11/05/21 0914 11/05/21 1515 11/07/21 0309  NA 139 141 146*  K 3.2* 3.6 3.8  CL 104 107 114*  CO2  22 23 21*  GLUCOSE 132* 118* 93  BUN 122* 120* 93*  CREATININE 4.25* 4.12* 2.64*  CALCIUM 9.1 9.3 9.1  PHOS  --  4.1  --    Recent Labs  Lab 11/05/21 0914 11/06/21 0357 11/07/21 0309  WBC 12.3* 11.7* 9.2  HGB 9.2* 8.3* 8.2*  HCT 29.0* 27.1* 27.1*  MCV 88.1 89.4 89.4  PLT 274 245 243

## 2021-11-07 NOTE — Progress Notes (Signed)
PROGRESS NOTE    [name]  OHY:[id number] DOB: [birth year] DOA: 11/05/2021  PCP: Seward Carol, MD   Brief Narrative: This 76 years old female with PMH significant for HTN, CKD stage IV, chronic diastolic CHF, CAD s/p stents, DVT, CVA, DM type II, and chronic gastritis who presented in the ED with generalized weakness.  Patient was admitted twice in the hospital for acute on chronic diastolic CHF exacerbation and was treated with furosemide.  Prior to last discharge her creatinine was 3.3, as per patient after she is discharged she was doing fine.  She has held her diuretics in last 3 days due to rising kidney functions.  Patient report feeling very weak associated with increased urinary frequency, intermittent substernal chest pain, was not able to get out of bed.  Work-up in the ED reveals white cell count 12.3, Hb 9.2, serum creatinine 4.25.  UA: Small LE, many bacteria, WBC+.  Patient is admitted for further evaluation and management.  Assessment & Plan:   Principal Problem:   Acute kidney injury superimposed on chronic kidney disease (HCC) Active Problems:   UTI (urinary tract infection)   Leukocytosis   Weakness   Nausea & vomiting   Hypokalemia   Benign essential HTN   Coronary artery disease involving native coronary artery of native heart   Anemia in chronic kidney disease   Chronic diastolic CHF (congestive heart failure) (HCC)   GERD without esophagitis   Type 2 diabetes mellitus with hyperlipidemia (HCC)   History of DVT (deep vein thrombosis)  AKI on CKD stage IV: Patient presented with creatinine elevated up to 4.25,  Prior to discharge her serum creatinine was 3.33, likely due to overdiuresis. Patient follows up with Nephrology in the outpatient setting. Continue IV hydration, avoid nephrotoxic medications. Nephrology consulted recommended to continue IV hydration. Serum creatinine has significantly improved  4.25> 2.64 Hold IV fluids for today and  encourage oral hydration. Anticipate discharge home tomorrow if serum creatinine improves.  Urinary tract infection: Patient reported increased urinary frequency, nocturia and burning. UA+ LE, WBC, bacteria, follow-up urine culture. Continue IV ceftriaxone.   Leukocytosis: Likely due to UTI.  Patient denies any fever. Chest x-ray is unremarkable.   Generalized weakness: Patient presented with worsening weakness likely due to overdiuresis. Continue IV hydration, no focal weakness. PT/OT: Home health services.   Nausea vomiting and diarrhea: > Resolved. Patient reports intermittent episodes of nausea and vomiting She also reports intermittent loose stools. Monitor intake output charting.  She reports diarrhea has improved. Continue IV Zofran as needed for nausea and vomiting   Hypokalemia: Replaced and resolved.  Coronary artery disease: Patient reports intermittent chest discomfort. EKG: unchanged as compared with prior EKG. Echo 10/24/2021 did not reveal any acute change in heart function  Essential hypertension: Hold amlodipine and hydralazine since blood pressure is on soft side.  Iron deficiency anemia /Anemia of chronic disease. Hemoglobin 9.1 which is around his baseline. Hb down to 8.3 could be due to IV hydration. No signs of any obvious bleeding noted  Chronic diastolic CHF: Patient does not appear to be acutely fluid overloaded at this time. Recent echo shows LVEF 60 to 65%. Will hold IV diuresis at this time. Discharge on low-dose of diuretics.  Type 2 diabetes: Last HbA1c 5.5 in August 23. Continue carb modified diet.   History of DVT, renal vein thrombosis Continue Eliquis   Chronic gastritis GERD Continue budesonide, Creon, Protonix, Pepcid   DVT prophylaxis: Eliquis Code Status: Full code Family Communication: No  family at bed side. Disposition Plan:   Status is: Inpatient Remains inpatient appropriate because: Admitted for AKI on CKD stage  IV, nephrology is consulted, continuing on IV hydration.  Monitor serum creatinine  Anticipated discharge home in 1 to 2 days.   Consultants:  Nephrology  Procedures: None  Antimicrobials: Ceftriaxone  Subjective: Patient was seen and examined at bedside.   Patient reports doing very much improved.   She reports weakness is improved,  nausea and vomiting has resolved.   Renal functions are improving.  She wants to be discharged.   Objective: Vitals:   11/07/21 0000 11/07/21 0425 11/07/21 0900 11/07/21 1255  BP: (!) 140/68 (!) 124/50 (!) 141/59 (!) 153/69  Pulse: 73  85 84  Resp: 13 14 16 15   Temp: 98.7 F (37.1 C) 98.6 F (37 C)  97.8 F (36.6 C)  TempSrc: Oral Oral  Oral  SpO2: 100%   97%    Intake/Output Summary (Last 24 hours) at 11/07/2021 1458 Last data filed at 11/07/2021 1100 Gross per 24 hour  Intake 3 ml  Output --  Net 3 ml   There were no vitals filed for this visit.  Examination:  General exam: Appears comfortable, not in any acute distress.  Deconditioned Respiratory system: CTA bilaterally, respiratory effort normal, RR 15 Cardiovascular system: S1 & S2 heard, regular rate and rhythm, no murmur.   Gastrointestinal system: Abdominal soft, nontender, nondistended, BS +. Central nervous system: Alert and oriented x 3. No focal neurological deficits. Extremities: No edema, no cyanosis, no clubbing Skin: No rashes, lesions or ulcers Psychiatry: Judgement and insight appear normal. Mood & affect appropriate.     Data Reviewed: I have personally reviewed following labs and imaging studies  CBC: Recent Labs  Lab 11/05/21 0914 11/06/21 0357 11/07/21 0309  WBC 12.3* 11.7* 9.2  HGB 9.2* 8.3* 8.2*  HCT 29.0* 27.1* 27.1*  MCV 88.1 89.4 89.4  PLT 274 245 527   Basic Metabolic Panel: Recent Labs  Lab 11/05/21 0914 11/05/21 1515 11/07/21 0309  NA 139 141 146*  K 3.2* 3.6 3.8  CL 104 107 114*  CO2 22 23 21*  GLUCOSE 132* 118* 93  BUN 122* 120*  93*  CREATININE 4.25* 4.12* 2.64*  CALCIUM 9.1 9.3 9.1  PHOS  --  4.1  --    GFR: Estimated Creatinine Clearance: 16.1 mL/min (A) (by C-G formula based on SCr of 2.64 mg/dL (H)). Liver Function Tests: Recent Labs  Lab 11/05/21 1515  ALBUMIN 3.1*   No results for input(s): "LIPASE", "AMYLASE" in the last 168 hours. No results for input(s): "AMMONIA" in the last 168 hours. Coagulation Profile: Recent Labs  Lab 11/05/21 1514  INR 1.3*   Cardiac Enzymes: No results for input(s): "CKTOTAL", "CKMB", "CKMBINDEX", "TROPONINI" in the last 168 hours. BNP (last 3 results) No results for input(s): "PROBNP" in the last 8760 hours. HbA1C: No results for input(s): "HGBA1C" in the last 72 hours. CBG: No results for input(s): "GLUCAP" in the last 168 hours. Lipid Profile: No results for input(s): "CHOL", "HDL", "LDLCALC", "TRIG", "CHOLHDL", "LDLDIRECT" in the last 72 hours. Thyroid Function Tests: No results for input(s): "TSH", "T4TOTAL", "FREET4", "T3FREE", "THYROIDAB" in the last 72 hours. Anemia Panel: No results for input(s): "VITAMINB12", "FOLATE", "FERRITIN", "TIBC", "IRON", "RETICCTPCT" in the last 72 hours. Sepsis Labs: No results for input(s): "PROCALCITON", "LATICACIDVEN" in the last 168 hours.  Recent Results (from the past 240 hour(s))  Urine Culture     Status: Abnormal   Collection  Time: 11/05/21  3:51 PM   Specimen: Urine, Clean Catch  Result Value Ref Range Status   Specimen Description URINE, CLEAN CATCH  Final   Special Requests   Final    NONE Performed at [facility], [address] [place], [place] [address]    Culture 60,000 COLONIES/mL YEAST (A)  Final   Report Status 11/07/2021 FINAL  Final     Radiology Studies: No results found.  Scheduled Meds:  apixaban  2.5 mg Oral BID   budesonide  3 mg Oral Daily   famotidine  10 mg Oral QHS   FLUoxetine  20 mg Oral Daily   lipase/protease/amylase  36,000 Units Oral TID WC   mirtazapine  7.5 mg Oral  QHS   pantoprazole  40 mg Oral Daily   saccharomyces boulardii  250 mg Oral BID   sodium chloride flush  3 mL Intravenous Q12H   Continuous Infusions:  cefTRIAXone (ROCEPHIN)  IV 1 g (11/06/21 2001)     LOS: 2 days    Time spent: 35 mins    Aino Heckert, MD [facility]   If 7PM-7AM, please contact night-coverage

## 2021-11-07 NOTE — TOC Initial Note (Signed)
Transition of Care [facility]) - Initial/Assessment Note    Patient Details  Name: [name] MRN: [id number] Date of Birth: Aug 10, 1945  Transition of Care [facility]) CM/SW Contact:    [name], RN Phone Number: 11/07/2021, 11:30 AM  Clinical Narrative:            [name] w patient at bedside. She was up sitting in chair. States she has been ambulating in room w/o RW. She states she has RW at home if she needs one once discharged.  We discussed plans for DC, she is looking forward to returning home. She had [place] services earlier this year and is familiar with services. We reviewed recs for [facility] PT and she feels she does not want [place] services after this hospitalization.      Plan for DC to home, self care, once cleared from nephrology.    Expected Discharge Plan: Home/Self Care Barriers to Discharge: Continued Medical Work up   Patient Goals and CMS Choice Patient states their goals for this hospitalization and ongoing recovery are:: to return home CMS Medicare.gov Compare Post Acute Care list provided to:: Patient Choice offered to / list presented to : Patient  Expected Discharge Plan and Services Expected Discharge Plan: Home/Self Care   Discharge Planning Services: CM Consult Post Acute Care Choice: NA Living arrangements for the past 2 months: Single Family Home                 DME Arranged: N/A         HH Arranged: Refused HH          Prior Living Arrangements/Services Living arrangements for the past 2 months: Single Family Home Lives with:: Relatives   Do you feel safe going back to the place where you live?: Yes               Activities of Daily Living Home Assistive Devices/Equipment: CBG Meter, Walker (specify type), Eyeglasses, Dentures (specify type) ADL Screening (condition at time of admission) Patient's cognitive ability adequate to safely complete daily activities?: Yes Is the patient deaf or have difficulty hearing?: No Does the patient have  difficulty seeing, even when wearing glasses/contacts?: Yes Does the patient have difficulty concentrating, remembering, or making decisions?: Yes Patient able to express need for assistance with ADLs?: Yes Does the patient have difficulty dressing or bathing?: No Independently performs ADLs?: Yes (appropriate for developmental age) Does the patient have difficulty walking or climbing stairs?: No Weakness of Legs: Both Weakness of Arms/Hands: None  Permission Sought/Granted                  Emotional Assessment              Admission diagnosis:  Weakness [R53.1] AKI (acute kidney injury) ([place]) [N17.9] Acute kidney injury superimposed on chronic kidney disease ([place]) [N17.9, N18.9] Patient Active Problem List   Diagnosis Date Noted   UTI (urinary tract infection) 11/05/2021   History of DVT (deep vein thrombosis) 11/05/2021   Depression 10/29/2021   Acute on chronic heart failure with preserved ejection fraction (HFpEF) ([place]) 10/24/2021   Chronic gastritis 10/19/2021   AKI (acute kidney injury) ([place]) 04/17/2021   Weakness 12/02/2020   (HFpEF) heart failure with preserved ejection fraction ([place]) 12/02/2020   Acute renal failure (ARF) ([place]) 12/02/2020   Elevated troponin 11/15/2020   Acute kidney injury superimposed on chronic kidney disease ([place]) 11/15/2020   Unintentional weight loss 11/15/2020   Malnutrition of moderate degree 09/27/2020   Transaminitis 09/23/2020  Acute renal failure superimposed on stage 4 chronic kidney disease (HCC) 09/03/2020   Chronic diastolic CHF (congestive heart failure) ([place]) 09/03/2020   Nausea & vomiting 09/03/2020   Dyspnea 06/28/2020   Chronic diarrhea - responds to budesonide 09/10/2019   Iron deficiency anemia 08/19/2018   Spondylosis of lumbar spine 12/24/2017   Renal vein thrombosis ([place]) 09/17/2017   Hoarse voice quality 09/09/2017   Low back pain without sciatica 09/09/2017   Hypokalemia    Leukocytosis    Hypertension  associated with diabetes ([place])    Urinary frequency    Type 2 diabetes mellitus with hyperlipidemia ([place])    Benign essential HTN    Dysphagia    Debility    Encephalopathy    Pressure injury of skin 08/04/2017   Diffuse pulmonary alveolar hemorrhage    SOB (shortness of breath) 10/11/2016   Hyperparathyroidism, secondary renal ([place]) 05/02/2016   Gout 04/11/2015   Diabetic neuropathy associated with type 2 diabetes mellitus ([place]) 01/06/2015   SBO (small bowel obstruction) ([place]) 09/15/2013   Breast cancer of lower-inner quadrant of right female breast ([place]) 03/18/2013   Chronic venous insufficiency of lower extremity 01/06/2013   Health care maintenance 05/08/2011   Seasonal allergies 05/03/2010   Labile hypertension    Coronary artery disease involving native coronary artery of native heart    Abdominal discomfort    Anemia in chronic kidney disease    History of stroke without residual deficits    Chronic pain    Adnexal mass    GERD without esophagitis 01/19/2009   Spinal stenosis, lumbar region, with neurogenic claudication 01/03/2009   PCP:  Seward Carol, MD Pharmacy:   [facility] ([place], [place] - [address] [place] [address] Phone: [phone] Fax: [phone]  [place] ([place]), [place] - [address] [place] [address] Phone: [phone] Fax: [place] [address]), [place] [address] [place] ([place] [address] Phone: [phone] Fax: [phone]  [facility] ([facility]) - [place], [place] [address] Phone: [phone] Fax: [phone]  [facility] [address] [place] [address] Phone: [phone] Fax: [phone]     Social  Determinants of Health (SDOH) Interventions    Readmission Risk Interventions    10/30/2021   10:20 AM 09/04/2020   12:47 PM  Readmission Risk Prevention Plan  Transportation Screening Complete Complete  Medication Review ([place]) Complete Complete  PCP or Specialist appointment within 3-5 days of discharge Complete   HRI or [place] Complete Complete  SW Recovery Care/Counseling Consult Complete Complete  Palliative Care Screening Not Applicable Not [place] Not Applicable Not Applicable

## 2021-11-08 DIAGNOSIS — N184 Chronic kidney disease, stage 4 (severe): Secondary | ICD-10-CM | POA: Diagnosis not present

## 2021-11-08 DIAGNOSIS — I10 Essential (primary) hypertension: Secondary | ICD-10-CM | POA: Diagnosis not present

## 2021-11-08 DIAGNOSIS — I5032 Chronic diastolic (congestive) heart failure: Secondary | ICD-10-CM | POA: Diagnosis not present

## 2021-11-08 DIAGNOSIS — N179 Acute kidney failure, unspecified: Secondary | ICD-10-CM | POA: Diagnosis not present

## 2021-11-08 LAB — BASIC METABOLIC PANEL
Anion gap: 7 (ref 5–15)
BUN: 77 mg/dL — ABNORMAL HIGH (ref 8–23)
CO2: 23 mmol/L (ref 22–32)
Calcium: 9.4 mg/dL (ref 8.9–10.3)
Chloride: 114 mmol/L — ABNORMAL HIGH (ref 98–111)
Creatinine, Ser: 2.29 mg/dL — ABNORMAL HIGH (ref 0.44–1.00)
GFR, Estimated: 22 mL/min — ABNORMAL LOW (ref >60)
Glucose, Bld: 80 mg/dL (ref 70–99)
Potassium: 3.6 mmol/L (ref 3.5–5.1)
Sodium: 144 mmol/L (ref 135–145)

## 2021-11-08 LAB — CBC
HCT: 26.9 % — ABNORMAL LOW (ref 36.0–46.0)
Hemoglobin: 8.3 g/dL — ABNORMAL LOW (ref 12.0–15.0)
MCH: 27.9 pg (ref 26.0–34.0)
MCHC: 30.9 g/dL (ref 30.0–36.0)
MCV: 90.3 fL (ref 80.0–100.0)
Platelets: 231 10*3/uL (ref 150–400)
RBC: 2.98 MIL/uL — ABNORMAL LOW (ref 3.87–5.11)
RDW: 18.2 % — ABNORMAL HIGH (ref 11.5–15.5)
WBC: 9.9 10*3/uL (ref 4.0–10.5)
nRBC: 0.2 % (ref 0.0–0.2)

## 2021-11-08 LAB — MAGNESIUM: Magnesium: 1.5 mg/dL — ABNORMAL LOW (ref 1.7–2.4)

## 2021-11-08 LAB — PHOSPHORUS: Phosphorus: 3.5 mg/dL (ref 2.5–4.6)

## 2021-11-08 MED ORDER — FUROSEMIDE 40 MG PO TABS: 40.0000 mg | ORAL_TABLET | Freq: Every day | ORAL | 0 refills | Status: DC

## 2021-11-08 MED ORDER — ACETAMINOPHEN 325 MG PO TABS: 650.0000 mg | ORAL_TABLET | Freq: Four times a day (QID) | ORAL | Status: DC | PRN

## 2021-11-08 NOTE — Progress Notes (Signed)
Admit: 11/05/2021 LOS: 3  69F AoCKD4, recent HFpEF exacerbation, UTI  Subjective:  Feels well, for discharge today [name] stable on RA No UOP reported SCr improved furhter, BUN as well Home dosing torsemide 51m po QAM Good appetite, no WOB, No [name]  08/22 0701 - 08/23 0700 In: 3 [I.V.:3] Out: -   Filed Weights   11/08/21 0500  Weight: 64.9 kg    Scheduled Meds:  apixaban  2.5 mg Oral BID   budesonide  3 mg Oral Daily   famotidine  10 mg Oral QHS   FLUoxetine  20 mg Oral Daily   lipase/protease/amylase  36,000 Units Oral TID WC   mirtazapine  7.5 mg Oral QHS   pantoprazole  40 mg Oral Daily   saccharomyces boulardii  250 mg Oral BID   sodium chloride flush  3 mL Intravenous Q12H   Continuous Infusions:  cefTRIAXone (ROCEPHIN)  IV 1 g (11/07/21 1715)   PRN Meds:.acetaminophen **OR** acetaminophen, albuterol, loperamide  Current Labs: reviewed    Physical Exam:  Blood pressure (!) 157/72, pulse 77, temperature 98.3 F (36.8 C), temperature source Oral, resp. rate 14, weight 64.9 kg, SpO2 95 %. GEN: NAD, sitting in chair ENT: NCAT EYES: EOMI CV: Regular, normal S1 and S2, no rub PULM: Clear bilaterally, diminished in the bases ABD: Soft, nontender SKIN: No rashes or lesions EXT: No peripheral edema NEURO: No asterixis   [name] G[facility] with hydration, she was overdiuresed WIll not req HD during this admission WIll req close f/u with [name] and Cardiology; UTI on Ceftriaxone per [facility]; Cx pending HFpEF, not decompensated; follows with [name] DM2 CAD/hx/o PCI  P OK For DC Furosemide 477mPO/d with daily weights upon DC [facility] Daily weights, Daily Renal Panel, Strict I/Os, Avoid nephrotoxins (NSAIDs, judicious IV Contrast)    RyPearson GrippeD 11/08/2021, 11:47 AM  Recent Labs  Lab 11/05/21 1515 11/07/21 0309 11/08/21 0508  NA 141 146* 144  K 3.6 3.8 3.6  CL  107 114* 114*  CO2 23 21* 23  GLUCOSE 118* 93 80  BUN 120* 93* 77*  CREATININE 4.12* 2.64* 2.29*  CALCIUM 9.3 9.1 9.4  PHOS 4.1  --  3.5    Recent Labs  Lab 11/06/21 0357 11/07/21 0309 11/08/21 0508  WBC 11.7* 9.2 9.9  HGB 8.3* 8.2* 8.3*  HCT 27.1* 27.1* 26.9*  MCV 89.4 89.4 90.3  PLT 245 243 231

## 2021-11-08 NOTE — Plan of Care (Signed)

## 2021-11-08 NOTE — Care Management Important Message (Signed)
Important Message  Patient Details  Name: [name] MRN: [id number] Date of Birth: 1946-02-14   Medicare Important Message Given:  Yes     [name] 11/08/2021, 1:50 PM

## 2021-11-08 NOTE — TOC Transition Note (Signed)
Transition of Care [facility]) - CM/SW Discharge Note   Patient Details  Name: [name] MRN: [id number] Date of Birth: 23-Mar-1945  Transition of Care [facility]) CM/SW Contact:  Cyndi Bender, RN Phone Number: 11/08/2021, 12:57 PM   Clinical Narrative:    Patient stable for discharge. Spoke to patient at bedside regarding home health. Patient does want Home health and has used [facility] in the past. Spoke to [place] with [facility] and they are unable to accept. Patient defers to [facility] to find Home health. Spoke to [place] with [facility] and referral accepted. Patient 's daughter will transport home. No other TOC needs at this time.   Final next level of care: [place] Barriers to Discharge: Barriers Resolved   Patient Goals and CMS Choice Patient states their goals for this hospitalization and ongoing recovery are:: to return home CMS Medicare.gov Compare Post Acute Care list provided to:: Patient Choice offered to / list presented to : Patient  Discharge Placement            Home          Discharge Plan and Services   Discharge Planning Services: CM Consult Post Acute Care Choice: NA          DME Arranged: N/A         HH Arranged: PT HH Agency: [place] Date [place]: 11/08/21 Time [place]: 1211 Representative spoke with at [place]: [place] ([place]) Interventions     Readmission Risk Interventions    11/08/2021   12:56 PM 10/30/2021   10:20 AM 09/04/2020   12:47 PM  Readmission Risk Prevention Plan  Transportation Screening Complete Complete Complete  Medication Review Press photographer) Complete Complete Complete  PCP or Specialist appointment within 3-5 days of discharge Complete Complete   HRI or Home Care Consult  Complete Complete  SW Recovery Care/Counseling Consult Complete Complete Complete  Palliative Care Screening Not Applicable Not Applicable Not [place] Not Applicable Not Applicable Not Applicable

## 2021-11-08 NOTE — Discharge Instructions (Signed)
Follow with Primary MD Seward Carol, MD in 7 days  - [place] weight yourself dauly  Get CBC, CMP, checked  by Primary MD next visit.    Activity: As tolerated with Full fall precautions use walker/cane & assistance as needed   Disposition Home    Diet: Heart Healthy , LOW SALT , with feeding assistance and aspiration precautions.  For Heart failure patients - Check your Weight same time everyday, if you gain over 2 pounds, or you develop in leg swelling, experience more shortness of breath or chest pain, call your Primary MD immediately. Follow Cardiac Low Salt Diet and 1.5 lit/day fluid restriction.   On your next visit with your primary care physician please Get Medicines reviewed and adjusted.   Please request your Prim.MD to go over all Hospital Tests and Procedure/Radiological results at the follow up, please get all Hospital records sent to your Prim MD by signing hospital release before you go home.   If you experience worsening of your admission symptoms, develop shortness of breath, life threatening emergency, suicidal or homicidal thoughts you must seek medical attention immediately by calling 911 or calling your MD immediately  if symptoms less severe.  You Must read complete instructions/literature along with all the possible adverse reactions/side effects for all the Medicines you take and that have been prescribed to you. Take any new Medicines after you have completely understood and accpet all the possible adverse reactions/side effects.   Do not drive, operating heavy machinery, perform activities at heights, swimming or participation in water activities or provide baby sitting services if your were admitted for syncope or siezures until you have seen by Primary MD or a Neurologist and advised to do so again.  Do not drive when taking Pain medications.    Do not take more than prescribed Pain, Sleep and Anxiety Medications  Special Instructions: If you have smoked  or chewed Tobacco  in the last 2 yrs please stop smoking, stop any regular Alcohol  and or any Recreational drug use.  Wear Seat belts while driving.   Please note  You were cared for by a hospitalist during your hospital stay. If you have any questions about your discharge medications or the care you received while you were in the hospital after you are discharged, you can call the unit and asked to speak with the hospitalist on call if the hospitalist that took care of you is not available. Once you are discharged, your primary care physician will handle any further medical issues. Please note that NO REFILLS for any discharge medications will be authorized once you are discharged, as it is imperative that you return to your primary care physician (or establish a relationship with a primary care physician if you do not have one) for your aftercare needs so that they can reassess your need for medications and monitor your lab values.

## 2021-11-08 NOTE — Consult Note (Signed)
   [name] Inpatient Consult   11/08/2021  [name] 08-27-1945 [id number]   [facility] [ACO] Patient:    Primary Care Provider: Seward Carol, MD [name] at [facility] is an Independent Embedded provider with a Care Management team and program and is listed for the [facility] follow up needs  Referral request for review:  [facility] Readmission Report   Patient screened for readmission hospitalization with noted extreme high risk score for unplanned readmission risk.  Reviewed to assess for potential [facility] Management service needs for post hospital transition for readmission prevention needs.  Review of patient's medical record reveals patient has an outside care management team with [facility] available.   Plan: Will reach out to [facility] to check status. Report for readmission request sent. For questions contact:    Natividad Brood, RN BSN [facility] Liaison  [phone] business mobile phone Toll free office [phone]  Fax number: [phone] [name]

## 2021-11-08 NOTE — Discharge Summary (Signed)
Physician Discharge Summary  [name] BOF:[id number] DOB: 07/11/1945 DOA: 11/05/2021  PCP: Seward Carol, MD  Admit date: 11/05/2021 Discharge date: 11/08/2021  Admitted From: Home Disposition:  Home  Recommendations for Outpatient Follow-up:  Follow up with PCP in 1-2 weeks Please obtain BMP/CBC in one week Follow-up with cardiology as an outpatient Patient to follow-up with renal as an outpatient  Home Health:YES   Discharge Condition:Stable CODE STATUS:FULL Diet recommendation: Heart Healthy / Carb Modified   Brief/Interim Summary: This 76 years old female with PMH significant for HTN, CKD stage IV, chronic diastolic CHF, CAD s/p stents, DVT, CVA, DM type II, and chronic gastritis who presented in the ED with generalized weakness.  Patient was admitted twice in the hospital for acute on chronic diastolic CHF exacerbation and was treated with furosemide.  Prior to last discharge her creatinine was 3.3, as per patient after she is discharged she was doing fine.  She has held her diuretics in last 3 days due to rising kidney functions.  Patient report feeling very weak associated with increased urinary frequency, intermittent substernal chest pain, was not able to get out of bed.  Work-up in the ED reveals white cell count 12.3, Hb 9.2, serum creatinine 4.25.  UA: Small LE, many bacteria, WBC+.  Patient is admitted for further evaluation and management.    AKI on CKD stage IV: Patient presented with creatinine elevated up to 4.25,  Prior to discharge her serum creatinine was 3.33, likely due to overdiuresis. Patient follows up with Nephrology in the outpatient setting. Patient was treated with IV hydration during hospital stay, her creatinine significantly improved, down from 4.2 on admission to 2.2 on discharge She will be resumed on low-dose Lasix on discharge 40 mg oral daily, she is instructed to weight herself daily.  Urinary tract infection: Treated with Rocephin x3  days   Leukocytosis: Likely due to UTI.  Patient denies any fever. Chest x-ray is unremarkable.   Generalized weakness: Health PT/OT   Nausea vomiting and diarrhea: > Resolved.   Hypokalemia: Replaced and resolved.   Coronary artery disease: Patient reports intermittent chest discomfort. EKG: unchanged as compared with prior EKG. Echo 10/24/2021 did not reveal any acute change in heart function   Essential hypertension: Pressure has been soft on admission, remains acceptable on discharge, will resume only on Norvasc, will discontinue hydralazine   Iron deficiency anemia /Anemia of chronic disease.   Chronic diastolic CHF: - volume depleted on admission, received IV fluids, she will be discharged on Lasix 40 mg oral daily, instructions to weight herself daily.  Instructed to follow with her cardiology as an outpatient Recent echo shows LVEF 60 to 65%.  Type 2 diabetes: Last HbA1c 5.5 in August 23. Continue carb modified diet.   History of DVT, renal vein thrombosis Continue Eliquis   Chronic gastritis GERD Continue budesonide, Creon, Protonix, Pepcid      Discharge Diagnoses:  Principal Problem:   Acute kidney injury superimposed on chronic kidney disease ([place]) Active Problems:   UTI (urinary tract infection)   Leukocytosis   Weakness   Nausea & vomiting   Hypokalemia   Benign essential HTN   Coronary artery disease involving native coronary artery of native heart   Anemia in chronic kidney disease   Chronic diastolic CHF (congestive heart failure) (HCC)   GERD without esophagitis   Type 2 diabetes mellitus with hyperlipidemia ([place])   History of DVT (deep vein thrombosis)    Discharge Instructions  Discharge Instructions  Diet - low sodium heart healthy   Complete by: As directed    Discharge instructions   Complete by: As directed    Follow with Primary MD Seward Carol, MD in 7 days  - Pease weight yourself dauly  Get CBC, CMP, checked  by  Primary MD next visit.    Activity: As tolerated with Full fall precautions use walker/cane & assistance as needed   Disposition Home    Diet: Heart Healthy , LOW SALT , with feeding assistance and aspiration precautions.  For Heart failure patients - Check your Weight same time everyday, if you gain over 2 pounds, or you develop in leg swelling, experience more shortness of breath or chest pain, call your Primary MD immediately. Follow Cardiac Low Salt Diet and 1.5 lit/day fluid restriction.   On your next visit with your primary care physician please Get Medicines reviewed and adjusted.   Please request your Prim.MD to go over all Hospital Tests and Procedure/Radiological results at the follow up, please get all Hospital records sent to your Prim MD by signing hospital release before you go home.   If you experience worsening of your admission symptoms, develop shortness of breath, life threatening emergency, suicidal or homicidal thoughts you must seek medical attention immediately by calling 911 or calling your MD immediately  if symptoms less severe.  You Must read complete instructions/literature along with all the possible adverse reactions/side effects for all the Medicines you take and that have been prescribed to you. Take any new Medicines after you have completely understood and accpet all the possible adverse reactions/side effects.   Do not drive, operating heavy machinery, perform activities at heights, swimming or participation in water activities or provide baby sitting services if your were admitted for syncope or siezures until you have seen by Primary MD or a Neurologist and advised to do so again.  Do not drive when taking Pain medications.    Do not take more than prescribed Pain, Sleep and Anxiety Medications  Special Instructions: If you have smoked or chewed Tobacco  in the last 2 yrs please stop smoking, stop any regular Alcohol  and or any Recreational drug  use.  Wear Seat belts while driving.   Please note  You were cared for by a hospitalist during your hospital stay. If you have any questions about your discharge medications or the care you received while you were in the hospital after you are discharged, you can call the unit and asked to speak with the hospitalist on call if the hospitalist that took care of you is not available. Once you are discharged, your primary care physician will handle any further medical issues. Please note that NO REFILLS for any discharge medications will be authorized once you are discharged, as it is imperative that you return to your primary care physician (or establish a relationship with a primary care physician if you do not have one) for your aftercare needs so that they can reassess your need for medications and monitor your lab values.   Increase activity slowly   Complete by: As directed       Allergies as of 11/08/2021       Reactions   Percocet [oxycodone-acetaminophen] Other (See Comments)   headaches   Haloperidol Lactate Nausea And Vomiting   Lorazepam Nausea Only   Morphine Sulfate Other (See Comments)   Agitation   Propoxyphene Hcl Nausea And Vomiting   Tramadol Hcl Nausea And Vomiting   Valium [diazepam] Other (  See Comments)   Agitation   Anesthetics, Halogenated Other (See Comments)   Per patient's daughter, she can not tolerate anesthetics.    Etomidate Other (See Comments)   Heart stopped during anesthesia   Fentanyl Other (See Comments)   Heart stopped during anesthesia   Haloperidol Nausea And Vomiting   Morphine Sulfate Nausea Only   Propoxyphene Nausea And Vomiting   Tramadol Hcl Nausea And Vomiting   Versed [midazolam] Other (See Comments)   Heart stopped during anesthesia        Medication List     STOP taking these medications    hydrALAZINE 100 MG tablet Commonly known as: APRESOLINE   torsemide 20 MG tablet Commonly known as: DEMADEX       TAKE these  medications    acetaminophen 325 MG tablet Commonly known as: TYLENOL Take 2 tablets (650 mg total) by mouth every 6 (six) hours as needed for mild pain (or Fever >/= 101). What changed:  medication strength how much to take reasons to take this   amLODipine 10 MG tablet Commonly known as: NORVASC Take 1/2 tablets (5 mg total) by mouth daily.   apixaban 2.5 MG Tabs tablet Commonly known as: Eliquis Take 1 tablet (2.5 mg total) by mouth 2 (two) times daily.   B-12 COMPLIANCE INJECTION IJ Inject 1 vial as directed every 30 (thirty) days.   budesonide 3 MG 24 hr capsule Commonly known as: ENTOCORT EC Take 1 capsule by mouth once daily   ergocalciferol 1.25 MG (50000 UT) capsule Commonly known as: VITAMIN D2 Take 1 capsule (50,000 Units total) by mouth once a week.   famotidine 20 MG tablet Commonly known as: PEPCID Take 10 mg by mouth at bedtime.   FLUoxetine 20 MG capsule Commonly known as: PROZAC Take 20 mg by mouth daily.   furosemide 40 MG tablet Commonly known as: Lasix Take 1 tablet (40 mg total) by mouth daily.   lipase/protease/amylase 36000 UNITS Cpep capsule Commonly known as: CREON Take 36,000 Units by mouth 3 (three) times daily with meals.   loperamide 2 MG tablet Commonly known as: IMODIUM A-D Take 4 mg by mouth 4 (four) times daily as needed for diarrhea or loose stools.   mirtazapine 7.5 MG tablet Commonly known as: REMERON Take 1 tablet (7.5 mg total) by mouth at bedtime.   nitroGLYCERIN 0.4 MG SL tablet Commonly known as: NITROSTAT DISSOLVE 1 TABLET UNDER THE TONGUE EVERY 5 MINUTES AS  NEEDED FOR CHEST PAIN. MAX  OF 3 TABLETS IN 15 MINUTES. CALL 911 IF PAIN PERSISTS. What changed: See the new instructions.   ondansetron 4 MG disintegrating tablet Commonly known as: ZOFRAN-ODT DISSOLVE 1 TABLET IN MOUTH EVERY 8 HOURS AS NEEDED FOR NAUSEA OR VOMITING   pantoprazole 40 MG tablet Commonly known as: PROTONIX TAKE 1 TABLET BY MOUTH  DAILY  BEFORE BREAKFAST What changed: when to take this   potassium chloride 20 MEQ packet Commonly known as: KLOR-CON Take 1 tablet (20 mEq) by mouth daily.        Follow-up Information     [facility] Follow up.   Specialty: Home Health Services Why: Home health has been arranged. They will contact you within 48 hours after discharge. Contact information: [address] [place] [address] [phone]         Madelon Lips, MD Follow up.   Specialty: Nephrology Contact information: [address] [place] [address] [phone]  Josue Hector, MD Follow up.   Specialty: Cardiology Contact information: [address] [place] [address] [phone]         Seward Carol, MD. Schedule an appointment as soon as possible for a visit.   Specialty: Internal Medicine Why: Hospital follow up Contact information: [address] [place] [address] [phone]         Josue Hector, MD .   Specialty: Cardiology Contact information: [phone] [place] [address] [phone]                Allergies  Allergen Reactions   Percocet [Oxycodone-Acetaminophen] Other (See Comments)    headaches   Haloperidol Lactate Nausea And Vomiting   Lorazepam Nausea Only   Morphine Sulfate Other (See Comments)    Agitation   Propoxyphene Hcl Nausea And Vomiting   Tramadol Hcl Nausea And Vomiting   Valium [Diazepam] Other (See Comments)    Agitation    Anesthetics, Halogenated Other (See Comments)    Per patient's daughter, she can not tolerate anesthetics.    Etomidate Other (See Comments)    Heart stopped during anesthesia   Fentanyl Other (See Comments)    Heart stopped during anesthesia    Haloperidol Nausea And Vomiting   Morphine Sulfate Nausea Only   Propoxyphene Nausea And Vomiting   Tramadol Hcl Nausea And Vomiting   Versed [Midazolam] Other (See  Comments)    Heart stopped during anesthesia    Consultations: Renal   Procedures/Studies: DG Chest 2 View  Result Date: 11/05/2021 CLINICAL DATA:  Shortness of breath. EXAM: CHEST - 2 VIEW COMPARISON:  10/23/2021 FINDINGS: The lungs are clear without focal pneumonia, edema, pneumothorax or pleural effusion. The cardiopericardial silhouette is within normal limits for size. The visualized bony structures of the thorax are unremarkable. IMPRESSION: No active cardiopulmonary disease. Electronically Signed   By: Misty Stanley M.D.   On: 11/05/2021 09:46   ECHOCARDIOGRAM COMPLETE  Result Date: 10/24/2021    ECHOCARDIOGRAM REPORT   Patient Name:   [name] Date of Exam: 10/24/2021 Medical Rec #:  [id number]          Height:       64.0 in Accession #:    [id number]         Weight:       150.2 lb Date of Birth:  Jun 08, 1945          BSA:          1.732 m Patient Age:    57 years           BP:           135/61 mmHg Patient Gender: F                  HR:           87 bpm. Exam Location:  Inpatient Procedure: 2D Echo, Cardiac Doppler and Color Doppler Indications:    CHF  History:        Patient has prior history of Echocardiogram examinations, most                 recent 06/30/2020. CHF, CAD; Risk Factors:Diabetes, Hypertension                 and Dyslipidemia.  Sonographer:    Jefferey Pica Referring Phys: [place]  1. Left ventricular ejection fraction, by estimation, is 60 to 65%. The  left ventricle has normal function. The left ventricle has no regional wall motion abnormalities. Left ventricular diastolic parameters are indeterminate.  2. Right ventricular systolic function is normal. The right ventricular size is normal. There is moderately elevated pulmonary artery systolic pressure.  3. Left atrial size was mildly dilated.  4. The mitral valve is normal in structure. Mild to moderate mitral valve regurgitation.  5. Tricuspid valve regurgitation is mild to moderate.  6. The  aortic valve is tricuspid. Aortic valve regurgitation is not visualized. No aortic stenosis is present.  7. The inferior vena cava is dilated in size with <50% respiratory variability, suggesting right atrial pressure of 15 mmHg. Comparison(s): Prior images reviewed side by side. The left ventricular function is unchanged. There is further increase in estimated right atrial and pulmonary artery pressure. Left ventricular diastolic parameters and mitral insufficiency appear largely unchanged. FINDINGS  Left Ventricle: Left ventricular ejection fraction, by estimation, is 60 to 65%. The left ventricle has normal function. The left ventricle has no regional wall motion abnormalities. The left ventricular internal cavity size was normal in size. There is  no left ventricular hypertrophy. Left ventricular diastolic parameters are indeterminate. Indeterminate filling pressures. Right Ventricle: The right ventricular size is normal. No increase in right ventricular wall thickness. Right ventricular systolic function is normal. There is moderately elevated pulmonary artery systolic pressure. The tricuspid regurgitant velocity is 3.06 m/s, and with an assumed right atrial pressure of 15 mmHg, the estimated right ventricular systolic pressure is 80.2 mmHg. Left Atrium: Left atrial size was mildly dilated. Right Atrium: Right atrial size was normal in size. Pericardium: There is no evidence of pericardial effusion. Mitral Valve: The mitral valve is normal in structure. Mild to moderate mitral valve regurgitation. Tricuspid Valve: The tricuspid valve is normal in structure. Tricuspid valve regurgitation is mild to moderate. Aortic Valve: The aortic valve is tricuspid. Aortic valve regurgitation is not visualized. No aortic stenosis is present. Aortic valve peak gradient measures 8.3 mmHg. Pulmonic Valve: The pulmonic valve was normal in structure. Pulmonic valve regurgitation is trivial. Aorta: The aortic root and ascending  aorta are structurally normal, with no evidence of dilitation. Venous: The inferior vena cava is dilated in size with less than 50% respiratory variability, suggesting right atrial pressure of 15 mmHg. IAS/Shunts: No atrial level shunt detected by color flow Doppler.  LEFT VENTRICLE PLAX 2D LVIDd:         4.20 cm   Diastology LVIDs:         2.20 cm   LV e' medial:    5.57 cm/s LV PW:         1.10 cm   LV E/e' medial:  16.6 LV IVS:        1.10 cm   LV e' lateral:   7.51 cm/s LVOT diam:     1.90 cm   LV E/e' lateral: 12.3 LV SV:         63 LV SV Index:   37 LVOT Area:     2.84 cm  RIGHT VENTRICLE             IVC RV Basal diam:  2.60 cm     IVC diam: 2.60 cm RV S prime:     13.30 cm/s TAPSE (M-mode): 3.0 cm LEFT ATRIUM             Index        RIGHT ATRIUM           Index LA diam:  4.00 cm 2.31 cm/m   RA Area:     10.70 cm LA Vol (A2C):   59.7 ml 34.46 ml/m  RA Volume:   24.20 ml  13.97 ml/m LA Vol (A4C):   52.1 ml 30.08 ml/m LA Biplane Vol: 58.3 ml 33.66 ml/m  AORTIC VALVE                 PULMONIC VALVE AV Area (Vmax): 1.85 cm     PV Vmax:       0.66 m/s AV Vmax:        144.00 cm/s  PV Peak grad:  1.8 mmHg AV Peak Grad:   8.3 mmHg LVOT Vmax:      94.20 cm/s LVOT Vmean:     57.600 cm/s LVOT VTI:       0.223 m  AORTA Ao Root diam: 3.00 cm Ao Asc diam:  3.10 cm MITRAL VALVE               TRICUSPID VALVE MV Area (PHT): 5.55 cm    TR Peak grad:   37.5 mmHg MV Decel Time: 137 msec    TR Vmax:        306.00 cm/s MV E velocity: 92.42 cm/s MV A velocity: 93.53 cm/s  SHUNTS MV E/A ratio:  0.99        Systemic VTI:  0.22 m                            Systemic Diam: 1.90 cm Dani Gobble Croitoru MD Electronically signed by Sanda Klein MD Signature Date/Time: 10/24/2021/3:32:23 PM    Final    DG Chest 2 View  Result Date: 10/23/2021 CLINICAL DATA:  Shortness of breath.  Lower extremity edema. EXAM: CHEST - 2 VIEW COMPARISON:  10/19/2021 FINDINGS: Cardiomegaly. Aortic tortuosity. Coronary artery stent. Pulmonary venous  hypertension with mild edema. Very small amount of pleural fluid. No lobar consolidation or collapse. IMPRESSION: Pulmonary venous hypertension with mild edema consistent with fluid overload/congestive heart failure. Electronically Signed   By: Nelson Chimes M.D.   On: 10/23/2021 20:45   US Venous Img Lower Bilateral (DVT)  Result Date: 10/19/2021 CLINICAL DATA:  Pain and swelling, prior history of DVT EXAM: BILATERAL LOWER EXTREMITY VENOUS DOPPLER ULTRASOUND TECHNIQUE: Gray-scale sonography with graded compression, as well as color Doppler and duplex ultrasound were performed to evaluate the lower extremity deep venous systems from the level of the common femoral vein and including the common femoral, femoral, profunda femoral, popliteal and calf veins including the posterior tibial, peroneal and gastrocnemius veins when visible. The superficial great saphenous vein was also interrogated. Spectral Doppler was utilized to evaluate flow at rest and with distal augmentation maneuvers in the common femoral, femoral and popliteal veins. COMPARISON:  None Available. FINDINGS: RIGHT LOWER EXTREMITY Common Femoral Vein: No evidence of thrombus. Normal compressibility, respiratory phasicity and response to augmentation. Saphenofemoral Junction: No evidence of thrombus. Normal compressibility and flow on color Doppler imaging. Profunda Femoral Vein: No evidence of thrombus. Normal compressibility and flow on color Doppler imaging. Femoral Vein: No evidence of thrombus. Normal compressibility, respiratory phasicity and response to augmentation. Popliteal Vein: No evidence of thrombus. Normal compressibility, respiratory phasicity and response to augmentation. Calf Veins: No evidence of thrombus. Normal compressibility and flow on color Doppler imaging. Superficial Great Saphenous Vein: No evidence of thrombus. Normal compressibility. Venous Reflux:  None. Other Findings:  There is edema in subcutaneous plane. LEFT LOWER  EXTREMITY Common Femoral  Vein: No evidence of thrombus. Normal compressibility, respiratory phasicity and response to augmentation. Saphenofemoral Junction: No evidence of thrombus. Normal compressibility and flow on color Doppler imaging. Profunda Femoral Vein: No evidence of thrombus. Normal compressibility and flow on color Doppler imaging. Femoral Vein: No evidence of thrombus. Normal compressibility, respiratory phasicity and response to augmentation. Popliteal Vein: No evidence of thrombus. Normal compressibility, respiratory phasicity and response to augmentation. Calf Veins: No evidence of thrombus. Normal compressibility and flow on color Doppler imaging. Superficial Great Saphenous Vein: No evidence of thrombus. Normal compressibility. Venous Reflux:  None. Other Findings:  There is edema in subcutaneous plane. IMPRESSION: No evidence of deep venous thrombosis in either lower extremity. Electronically Signed   By: Elmer Picker M.D.   On: 10/19/2021 19:20   DG Chest Portable 1 View  Result Date: 10/19/2021 CLINICAL DATA:  Two weeks of swelling of both legs. EXAM: PORTABLE CHEST 1 VIEW COMPARISON:  April 16, 2021 FINDINGS: The heart size and mediastinal contours are within normal limits. Low lung volumes are noted. Both lungs are clear. Radiopaque surgical clips are seen along the right axilla and within the right upper quadrant. Multilevel degenerative changes are seen throughout the thoracic spine. IMPRESSION: Low lung volumes without evidence of acute or active cardiopulmonary disease. Electronically Signed   By: Virgina Norfolk M.D.   On: 10/19/2021 18:56      Subjective:  She denies any complaints today,no dyspnea, no chest pain Discharge Exam: Vitals:   11/08/21 0400 11/08/21 0700  BP: (!) 160/71 (!) 157/72  Pulse: 75 77  Resp: 12 14  Temp: 98.7 F (37.1 C) 98.3 F (36.8 C)  SpO2: 93% 95%   Vitals:   11/07/21 2358 11/08/21 0400 11/08/21 0500 11/08/21 0700  BP: (!)  170/76 (!) 160/71  (!) 157/72  Pulse: 79 75  77  Resp: 13 12  14   Temp: 98 F (36.7 C) 98.7 F (37.1 C)  98.3 F (36.8 C)  TempSrc: Oral Oral  Oral  SpO2: 95% 93%  95%  Weight:   64.9 kg     General: Pt is alert, awake, not in acute distress Cardiovascular: RRR, S1/S2 +, no rubs, no gallops Respiratory: CTA bilaterally, no wheezing, no rhonchi Abdominal: Soft, NT, ND, bowel sounds + Extremities: no edema, no cyanosis    The results of significant diagnostics from this hospitalization (including imaging, microbiology, ancillary and laboratory) are listed below for reference.     Microbiology: Recent Results (from the past 240 hour(s))  Urine Culture     Status: Abnormal   Collection Time: 11/05/21  3:51 PM   Specimen: Urine, Clean Catch  Result Value Ref Range Status   Specimen Description URINE, CLEAN CATCH  Final   Special Requests   Final    NONE Performed at [facility], [place] [address], [place], [place] [address]    Culture 60,000 COLONIES/mL YEAST (A)  Final   Report Status 11/07/2021 FINAL  Final     Labs: BNP (last 3 results) Recent Labs    10/19/21 1819 10/23/21 2012 10/29/21 0247  BNP 338.1* 352.1* 47.6   Basic Metabolic Panel: Recent Labs  Lab 11/05/21 0914 11/05/21 1515 11/07/21 0309 11/08/21 0508  NA 139 141 146* 144  K 3.2* 3.6 3.8 3.6  CL 104 107 114* 114*  CO2 22 23 21* 23  GLUCOSE 132* 118* 93 80  BUN 122* 120* 93* 77*  CREATININE 4.25* 4.12* 2.64* 2.29*  CALCIUM 9.1 9.3 9.1 9.4  MG  --   --   --  1.5*  PHOS  --  4.1  --  3.5   Liver Function Tests: Recent Labs  Lab 11/05/21 1515  ALBUMIN 3.1*   No results for input(s): "LIPASE", "AMYLASE" in the last 168 hours. No results for input(s): "AMMONIA" in the last 168 hours. CBC: Recent Labs  Lab 11/05/21 0914 11/06/21 0357 11/07/21 0309 11/08/21 0508  WBC 12.3* 11.7* 9.2 9.9  HGB 9.2* 8.3* 8.2* 8.3*  HCT 29.0* 27.1* 27.1* 26.9*  MCV 88.1 89.4 89.4 90.3  PLT 274 245  243 231   Cardiac Enzymes: No results for input(s): "CKTOTAL", "CKMB", "CKMBINDEX", "TROPONINI" in the last 168 hours. BNP: Invalid input(s): "POCBNP" CBG: No results for input(s): "GLUCAP" in the last 168 hours. D-Dimer No results for input(s): "DDIMER" in the last 72 hours. Hgb A1c No results for input(s): "HGBA1C" in the last 72 hours. Lipid Profile No results for input(s): "CHOL", "HDL", "LDLCALC", "TRIG", "CHOLHDL", "LDLDIRECT" in the last 72 hours. Thyroid function studies No results for input(s): "TSH", "T4TOTAL", "T3FREE", "THYROIDAB" in the last 72 hours.  Invalid input(s): "FREET3" Anemia work up No results for input(s): "VITAMINB12", "FOLATE", "FERRITIN", "TIBC", "IRON", "RETICCTPCT" in the last 72 hours. Urinalysis    Component Value Date/Time   COLORURINE YELLOW 11/05/2021 1149   APPEARANCEUR CLEAR 11/05/2021 1149   APPEARANCEUR Clear 08/22/2018 1620   LABSPEC 1.010 11/05/2021 1149   LABSPEC 1.020 03/03/2014 1341   PHURINE 5.5 11/05/2021 1149   GLUCOSEU NEGATIVE 11/05/2021 1149   GLUCOSEU Negative 03/03/2014 1341   HGBUR NEGATIVE 11/05/2021 1149   BILIRUBINUR NEGATIVE 11/05/2021 1149   BILIRUBINUR Negative 08/22/2018 1620   BILIRUBINUR Negative 03/03/2014 1341   KETONESUR NEGATIVE 11/05/2021 1149   PROTEINUR NEGATIVE 11/05/2021 1149   UROBILINOGEN 0.2 03/03/2014 1341   NITRITE NEGATIVE 11/05/2021 1149   LEUKOCYTESUR SMALL (A) 11/05/2021 1149   LEUKOCYTESUR Negative 03/03/2014 1341   Sepsis Labs Recent Labs  Lab 11/05/21 0914 11/06/21 0357 11/07/21 0309 11/08/21 0508  WBC 12.3* 11.7* 9.2 9.9   Microbiology Recent Results (from the past 240 hour(s))  Urine Culture     Status: Abnormal   Collection Time: 11/05/21  3:51 PM   Specimen: Urine, Clean Catch  Result Value Ref Range Status   Specimen Description URINE, CLEAN CATCH  Final   Special Requests   Final    NONE Performed at [facility], [place] [address], [place], [place] [address]     Culture 60,000 COLONIES/mL YEAST (A)  Final   Report Status 11/07/2021 FINAL  Final     Time coordinating discharge: Over 30 minutes  SIGNED:   Phillips Climes, MD  [facility] 11/08/2021, 1:01 PM Pager   If 7PM-7AM, please contact night-coverage [email or web address]

## 2021-11-09 ENCOUNTER — Other Ambulatory Visit: Payer: Self-pay

## 2021-11-09 ENCOUNTER — Inpatient Hospital Stay: Payer: Medicare Other

## 2021-11-09 ENCOUNTER — Inpatient Hospital Stay: Payer: Medicare Other | Attending: Hematology

## 2021-11-09 VITALS — BP 129/54 | HR 82 | Temp 98.7°F | Resp 18

## 2021-11-09 DIAGNOSIS — I10 Essential (primary) hypertension: Secondary | ICD-10-CM | POA: Diagnosis not present

## 2021-11-09 DIAGNOSIS — I823 Embolism and thrombosis of renal vein: Secondary | ICD-10-CM | POA: Diagnosis not present

## 2021-11-09 DIAGNOSIS — I4519 Other right bundle-branch block: Secondary | ICD-10-CM | POA: Diagnosis not present

## 2021-11-09 DIAGNOSIS — D509 Iron deficiency anemia, unspecified: Secondary | ICD-10-CM

## 2021-11-09 DIAGNOSIS — D631 Anemia in chronic kidney disease: Secondary | ICD-10-CM | POA: Insufficient documentation

## 2021-11-09 DIAGNOSIS — K219 Gastro-esophageal reflux disease without esophagitis: Secondary | ICD-10-CM | POA: Diagnosis not present

## 2021-11-09 DIAGNOSIS — G8929 Other chronic pain: Secondary | ICD-10-CM | POA: Diagnosis not present

## 2021-11-09 DIAGNOSIS — E559 Vitamin D deficiency, unspecified: Secondary | ICD-10-CM | POA: Diagnosis not present

## 2021-11-09 DIAGNOSIS — M109 Gout, unspecified: Secondary | ICD-10-CM | POA: Diagnosis not present

## 2021-11-09 DIAGNOSIS — E1169 Type 2 diabetes mellitus with other specified complication: Secondary | ICD-10-CM | POA: Diagnosis not present

## 2021-11-09 DIAGNOSIS — N184 Chronic kidney disease, stage 4 (severe): Secondary | ICD-10-CM

## 2021-11-09 DIAGNOSIS — E876 Hypokalemia: Secondary | ICD-10-CM | POA: Diagnosis not present

## 2021-11-09 DIAGNOSIS — I5033 Acute on chronic diastolic (congestive) heart failure: Secondary | ICD-10-CM | POA: Diagnosis not present

## 2021-11-09 DIAGNOSIS — E538 Deficiency of other specified B group vitamins: Secondary | ICD-10-CM | POA: Insufficient documentation

## 2021-11-09 DIAGNOSIS — N2581 Secondary hyperparathyroidism of renal origin: Secondary | ICD-10-CM | POA: Diagnosis not present

## 2021-11-09 DIAGNOSIS — M48062 Spinal stenosis, lumbar region with neurogenic claudication: Secondary | ICD-10-CM | POA: Diagnosis not present

## 2021-11-09 DIAGNOSIS — N390 Urinary tract infection, site not specified: Secondary | ICD-10-CM | POA: Diagnosis not present

## 2021-11-09 DIAGNOSIS — D464 Refractory anemia, unspecified: Secondary | ICD-10-CM

## 2021-11-09 DIAGNOSIS — I2510 Atherosclerotic heart disease of native coronary artery without angina pectoris: Secondary | ICD-10-CM | POA: Diagnosis not present

## 2021-11-09 DIAGNOSIS — N179 Acute kidney failure, unspecified: Secondary | ICD-10-CM | POA: Diagnosis not present

## 2021-11-09 DIAGNOSIS — Z6823 Body mass index (BMI) 23.0-23.9, adult: Secondary | ICD-10-CM | POA: Diagnosis not present

## 2021-11-09 DIAGNOSIS — E785 Hyperlipidemia, unspecified: Secondary | ICD-10-CM | POA: Diagnosis not present

## 2021-11-09 DIAGNOSIS — I130 Hypertensive heart and chronic kidney disease with heart failure and stage 1 through stage 4 chronic kidney disease, or unspecified chronic kidney disease: Secondary | ICD-10-CM | POA: Diagnosis not present

## 2021-11-09 DIAGNOSIS — E1122 Type 2 diabetes mellitus with diabetic chronic kidney disease: Secondary | ICD-10-CM | POA: Diagnosis not present

## 2021-11-09 DIAGNOSIS — E7800 Pure hypercholesterolemia, unspecified: Secondary | ICD-10-CM | POA: Diagnosis not present

## 2021-11-09 DIAGNOSIS — K2950 Unspecified chronic gastritis without bleeding: Secondary | ICD-10-CM | POA: Diagnosis not present

## 2021-11-09 LAB — CBC WITH DIFFERENTIAL (CANCER CENTER ONLY)
Abs Immature Granulocytes: 0.07 10*3/uL (ref 0.00–0.07)
Basophils Absolute: 0 10*3/uL (ref 0.0–0.1)
Basophils Relative: 0 %
Eosinophils Absolute: 0.1 10*3/uL (ref 0.0–0.5)
Eosinophils Relative: 1 %
HCT: 26.5 % — ABNORMAL LOW (ref 36.0–46.0)
Hemoglobin: 8.5 g/dL — ABNORMAL LOW (ref 12.0–15.0)
Immature Granulocytes: 1 %
Lymphocytes Relative: 12 %
Lymphs Abs: 1.2 10*3/uL (ref 0.7–4.0)
MCH: 27.9 pg (ref 26.0–34.0)
MCHC: 32.1 g/dL (ref 30.0–36.0)
MCV: 86.9 fL (ref 80.0–100.0)
Monocytes Absolute: 0.7 10*3/uL (ref 0.1–1.0)
Monocytes Relative: 7 %
Neutro Abs: 8.1 10*3/uL — ABNORMAL HIGH (ref 1.7–7.7)
Neutrophils Relative %: 79 %
Platelet Count: 224 10*3/uL (ref 150–400)
RBC: 3.05 MIL/uL — ABNORMAL LOW (ref 3.87–5.11)
RDW: 18.3 % — ABNORMAL HIGH (ref 11.5–15.5)
WBC Count: 10.1 10*3/uL (ref 4.0–10.5)
nRBC: 0 % (ref 0.0–0.2)

## 2021-11-09 LAB — SAMPLE TO BLOOD BANK

## 2021-11-09 MED ORDER — CYANOCOBALAMIN 1000 MCG/ML IJ SOLN
1000.0000 ug | Freq: Once | INTRAMUSCULAR | Status: AC
  Administered 2021-11-09: 1000 ug via INTRAMUSCULAR
  Filled 2021-11-09: qty 1

## 2021-11-09 MED ORDER — DARBEPOETIN ALFA 200 MCG/0.4ML IJ SOSY
200.0000 ug | PREFILLED_SYRINGE | Freq: Once | INTRAMUSCULAR | Status: AC
  Administered 2021-11-09: 200 ug via SUBCUTANEOUS
  Filled 2021-11-09: qty 0.4

## 2021-11-10 LAB — FERRITIN: Ferritin: 513 ng/mL — ABNORMAL HIGH (ref 11–307)

## 2021-11-13 ENCOUNTER — Telehealth: Payer: Self-pay | Admitting: Hematology

## 2021-11-13 NOTE — Telephone Encounter (Signed)
Contacted patient to scheduled appointments. Patient is aware of appointments that are scheduled.

## 2021-11-15 DIAGNOSIS — E538 Deficiency of other specified B group vitamins: Secondary | ICD-10-CM | POA: Diagnosis not present

## 2021-11-15 DIAGNOSIS — E876 Hypokalemia: Secondary | ICD-10-CM | POA: Diagnosis not present

## 2021-11-15 DIAGNOSIS — G8929 Other chronic pain: Secondary | ICD-10-CM | POA: Diagnosis not present

## 2021-11-15 DIAGNOSIS — D509 Iron deficiency anemia, unspecified: Secondary | ICD-10-CM | POA: Diagnosis not present

## 2021-11-15 DIAGNOSIS — N2581 Secondary hyperparathyroidism of renal origin: Secondary | ICD-10-CM | POA: Diagnosis not present

## 2021-11-15 DIAGNOSIS — M109 Gout, unspecified: Secondary | ICD-10-CM | POA: Diagnosis not present

## 2021-11-15 DIAGNOSIS — E559 Vitamin D deficiency, unspecified: Secondary | ICD-10-CM | POA: Diagnosis not present

## 2021-11-15 DIAGNOSIS — D631 Anemia in chronic kidney disease: Secondary | ICD-10-CM | POA: Diagnosis not present

## 2021-11-15 DIAGNOSIS — N390 Urinary tract infection, site not specified: Secondary | ICD-10-CM | POA: Diagnosis not present

## 2021-11-15 DIAGNOSIS — I2510 Atherosclerotic heart disease of native coronary artery without angina pectoris: Secondary | ICD-10-CM | POA: Diagnosis not present

## 2021-11-15 DIAGNOSIS — N179 Acute kidney failure, unspecified: Secondary | ICD-10-CM | POA: Diagnosis not present

## 2021-11-15 DIAGNOSIS — K219 Gastro-esophageal reflux disease without esophagitis: Secondary | ICD-10-CM | POA: Diagnosis not present

## 2021-11-15 DIAGNOSIS — I823 Embolism and thrombosis of renal vein: Secondary | ICD-10-CM | POA: Diagnosis not present

## 2021-11-15 DIAGNOSIS — I4519 Other right bundle-branch block: Secondary | ICD-10-CM | POA: Diagnosis not present

## 2021-11-15 DIAGNOSIS — E785 Hyperlipidemia, unspecified: Secondary | ICD-10-CM | POA: Diagnosis not present

## 2021-11-15 DIAGNOSIS — M48062 Spinal stenosis, lumbar region with neurogenic claudication: Secondary | ICD-10-CM | POA: Diagnosis not present

## 2021-11-15 DIAGNOSIS — K2950 Unspecified chronic gastritis without bleeding: Secondary | ICD-10-CM | POA: Diagnosis not present

## 2021-11-15 DIAGNOSIS — Z6823 Body mass index (BMI) 23.0-23.9, adult: Secondary | ICD-10-CM | POA: Diagnosis not present

## 2021-11-15 DIAGNOSIS — N184 Chronic kidney disease, stage 4 (severe): Secondary | ICD-10-CM | POA: Diagnosis not present

## 2021-11-15 DIAGNOSIS — I130 Hypertensive heart and chronic kidney disease with heart failure and stage 1 through stage 4 chronic kidney disease, or unspecified chronic kidney disease: Secondary | ICD-10-CM | POA: Diagnosis not present

## 2021-11-15 DIAGNOSIS — E1169 Type 2 diabetes mellitus with other specified complication: Secondary | ICD-10-CM | POA: Diagnosis not present

## 2021-11-15 DIAGNOSIS — I5033 Acute on chronic diastolic (congestive) heart failure: Secondary | ICD-10-CM | POA: Diagnosis not present

## 2021-11-15 DIAGNOSIS — E1122 Type 2 diabetes mellitus with diabetic chronic kidney disease: Secondary | ICD-10-CM | POA: Diagnosis not present

## 2021-11-17 ENCOUNTER — Encounter: Payer: Self-pay | Admitting: Hematology

## 2021-11-21 DIAGNOSIS — N184 Chronic kidney disease, stage 4 (severe): Secondary | ICD-10-CM | POA: Diagnosis not present

## 2021-11-21 DIAGNOSIS — I10 Essential (primary) hypertension: Secondary | ICD-10-CM | POA: Diagnosis not present

## 2021-11-21 DIAGNOSIS — I700 Atherosclerosis of aorta: Secondary | ICD-10-CM | POA: Diagnosis not present

## 2021-11-21 DIAGNOSIS — N179 Acute kidney failure, unspecified: Secondary | ICD-10-CM | POA: Diagnosis not present

## 2021-11-21 DIAGNOSIS — E7800 Pure hypercholesterolemia, unspecified: Secondary | ICD-10-CM | POA: Diagnosis not present

## 2021-11-21 DIAGNOSIS — Z853 Personal history of malignant neoplasm of breast: Secondary | ICD-10-CM | POA: Diagnosis not present

## 2021-11-24 ENCOUNTER — Inpatient Hospital Stay: Payer: Medicare HMO | Attending: Hematology

## 2021-11-24 ENCOUNTER — Other Ambulatory Visit: Payer: Self-pay

## 2021-11-24 ENCOUNTER — Inpatient Hospital Stay (HOSPITAL_BASED_OUTPATIENT_CLINIC_OR_DEPARTMENT_OTHER): Payer: Medicare HMO | Admitting: Physician Assistant

## 2021-11-24 ENCOUNTER — Other Ambulatory Visit: Payer: Self-pay | Admitting: Hematology

## 2021-11-24 ENCOUNTER — Inpatient Hospital Stay: Payer: Medicare HMO

## 2021-11-24 VITALS — BP 121/53 | HR 76 | Temp 97.9°F | Resp 16 | Ht 64.0 in | Wt 144.5 lb

## 2021-11-24 DIAGNOSIS — Z79899 Other long term (current) drug therapy: Secondary | ICD-10-CM | POA: Insufficient documentation

## 2021-11-24 DIAGNOSIS — D631 Anemia in chronic kidney disease: Secondary | ICD-10-CM | POA: Insufficient documentation

## 2021-11-24 DIAGNOSIS — D509 Iron deficiency anemia, unspecified: Secondary | ICD-10-CM

## 2021-11-24 DIAGNOSIS — E538 Deficiency of other specified B group vitamins: Secondary | ICD-10-CM | POA: Insufficient documentation

## 2021-11-24 DIAGNOSIS — N184 Chronic kidney disease, stage 4 (severe): Secondary | ICD-10-CM | POA: Diagnosis present

## 2021-11-24 DIAGNOSIS — D464 Refractory anemia, unspecified: Secondary | ICD-10-CM

## 2021-11-24 LAB — CBC WITH DIFFERENTIAL (CANCER CENTER ONLY)
Abs Immature Granulocytes: 0.04 10*3/uL (ref 0.00–0.07)
Basophils Absolute: 0 10*3/uL (ref 0.0–0.1)
Basophils Relative: 0 %
Eosinophils Absolute: 0 10*3/uL (ref 0.0–0.5)
Eosinophils Relative: 0 %
HCT: 30.3 % — ABNORMAL LOW (ref 36.0–46.0)
Hemoglobin: 9.4 g/dL — ABNORMAL LOW (ref 12.0–15.0)
Immature Granulocytes: 1 %
Lymphocytes Relative: 16 %
Lymphs Abs: 1.1 10*3/uL (ref 0.7–4.0)
MCH: 28.8 pg (ref 26.0–34.0)
MCHC: 31 g/dL (ref 30.0–36.0)
MCV: 92.9 fL (ref 80.0–100.0)
Monocytes Absolute: 0.6 10*3/uL (ref 0.1–1.0)
Monocytes Relative: 9 %
Neutro Abs: 5.2 10*3/uL (ref 1.7–7.7)
Neutrophils Relative %: 74 %
Platelet Count: 179 10*3/uL (ref 150–400)
RBC: 3.26 MIL/uL — ABNORMAL LOW (ref 3.87–5.11)
RDW: 19.1 % — ABNORMAL HIGH (ref 11.5–15.5)
WBC Count: 7 10*3/uL (ref 4.0–10.5)
nRBC: 0 % (ref 0.0–0.2)

## 2021-11-24 LAB — SAMPLE TO BLOOD BANK

## 2021-11-24 MED ORDER — DARBEPOETIN ALFA 200 MCG/0.4ML IJ SOSY
200.0000 ug | PREFILLED_SYRINGE | Freq: Once | INTRAMUSCULAR | Status: AC
  Administered 2021-11-24: 200 ug via SUBCUTANEOUS
  Filled 2021-11-24: qty 0.4

## 2021-11-26 ENCOUNTER — Encounter: Payer: Self-pay | Admitting: Hematology

## 2021-11-26 MED ORDER — FUROSEMIDE 40 MG PO TABS: 40.0000 mg | ORAL_TABLET | Freq: Every day | ORAL | 0 refills | Status: DC

## 2021-11-26 NOTE — Progress Notes (Signed)
[facility]   [phone] [phone]   Clinic Follow up Note   Patient Care Team: Seward Carol, MD as PCP - General (Internal Medicine) Josue Hector, MD as PCP - Cardiology (Cardiology) Thelma Comp, [place] as Consulting Physician (Optometry) Stark Klein, MD as Consulting Physician (General Surgery) Truitt Merle, MD as Consulting Physician (Hematology) Madelon Lips, MD as Consulting Physician (Nephrology)  Date of Service:  11/24/2021  CHIEF COMPLAINT: f/u of anemia of chronic disease, h/o right breast cancer  CURRENT THERAPY:  -Retacrit injection starting 09/2018, currently q2weeks -B12 injection starting 05/04/19, currently q6weeks -Oral Ferrous sulfate daily  -IV Feraheme as needed starting 09/15/18. Last on 12/21/19  SUMMARY OF ONCOLOGIC HISTORY: Oncology History Overview Note  Cancer Staging Breast cancer of lower-inner quadrant of right female breast [facility]) Staging form: Breast, AJCC 7th Edition - Clinical: Stage IA (T1b, N0, cM0) - Unsigned Staging comments: Staged at breast conference 03/25/13.  - Pathologic stage from 05/12/2013: Stage IA (T1b, N0, cM0) - Signed by Truitt Merle, MD on 06/21/2016     Breast cancer of lower-inner quadrant of right female breast ([place])  02/27/2013 Mammogram   Diagnostic mammogram and ultrasound showed a 6 x 5 mm mass at 5:00 position. Axilla was negative by ultrasound.   03/16/2013 Initial Biopsy   Right breast 5:00 position biopsy showed invasive ductal carcinoma, low-grade   03/16/2013 Receptors her2   ER100% positive, PR 100% positive, HER-2 negative, Ki-67 20%   03/18/2013 Initial Diagnosis   Breast cancer of lower-inner quadrant of right female breast ([place])   05/12/2013 Surgery   Right breast lumpectomy with sentinel lymph node biopsy   05/12/2013 Pathology Results   Right breast lumpectomy showed invasive ductal carcinoma, grade 2, 0.9 cm, 6 sentinel lymph nodes were all negative. Margins were  negative.   05/12/2013 Oncotype testing   Oncotype recurrence score 19, intermediate risk, 10 year risk of distant recurrence 12% with tamoxifen for 5 years. Patient declined adjuvant chemotherapy.   06/02/2013 - 07/16/2013 Radiation Therapy   Adjuvant breast radiation with boost 1600Gy   08/06/2013 - 07/2017 Anti-estrogen oral therapy   Arimidex 1 mg daily. Stopped in 07/2017 due to acute illness    03/22/2017 Mammogram   IMPRESSION: No mammographic evidence for malignancy.   Iron deficiency anemia     INTERVAL HISTORY:  [name] is here for a follow up of anemia. She was last seen by Dr. Burr Medico on 06/22/2021.   [name] reports that she is doing well without any new symptoms. She reports that her energy levels are stable. She is eating well without any significant weight changes. She denies nausea, vomiting or abdominal pain. Her bowel habits are unchanged without any diarrhea or constipation. She denies easy bruising or signs of bleeding. She has swelling in her lower extremities that is well controlled with lasix once daily. She denies fevers, chills, sweats, shortness of breath, chest pain or cough. She has no other complaints.     All other systems were reviewed with the patient and are negative.  MEDICAL HISTORY:  Past Medical History:  Diagnosis Date   Abdominal discomfort    Chronic N/V/D. Presumptive dx Crohn's dx per elevated p ANCA. Failed Entocort and Pentasa. Sep 2003 - ileocolectomy c anastomosis per Dr Deon Pilling 2/2 adhesions - path was hegative for Crohns. EGD, Sm bowel follow through (11/03), and an eteroclysis (10/03) were unrevealing. Cuases hypomag and hypocalcemia.   Adnexal mass 8/03   s/p lap BSO (R ovarian  fibroma) & lysis of adhesions   Allergy    Seasonal   Anemia    Multifactorial. Baseline HgB 10-11 ish. B12 def - 150 in 3/10. Fe Def - ferritin 35 3/10. Both are being repleted.   Breast cancer ([place]) 03/16/13   right, 5 o'clock   CAD (coronary artery  disease) 1996   1996 - PTCA and angioplasty diagonal branch. 2000 - Rotoblator & angiopllasty of diagonal. 2006 - subendocardial AMI, DES to proximal LAD.Marland Kitchen Also had 90% stenosis in distal apical LAD. EF 55 with apical hypokinesis. Indefinite ASA and Plavix.   CHF (congestive heart failure) (HCC)    Chronic kidney disease    Chronic renal insuff baseline Cr 1.2 - 1.4 ish.   Chronic kidney disease, stage III (moderate) (HCC)    Chronic renal insuff baseline Cr 1.2 - 1.4 ish. New baseline about 2 (2018)   Chronic pain    CT 10/10 = Spinal stenosis L2 - S1.   COVID-19 11/02/2020   Diabetes mellitus    Insulin dependent   Diabetes mellitus type 2 in obese ([place]) 05/03/2010   Managed on lantus and novolog. Has diabetic nephropathy. Metformin D/C'd 2012 2/2 creatinine.  No diabetic retinopathy per 3/11.    Gout    Hx of radiation therapy 06/02/13- 07/16/13   right rbeast 4500 cGy 25 sessions, right breast boost 1600 cGy in 8 sessions   Hyperlipidemia    Managed with both a statin and Welchol. Welchol stopped 2014 2/2 cost and started on fenofibrate    Hypertension    2006 B renal arteries patent. 2003 MRA - no RAS. 2003 pheo W/U Dr Hassell Done reportedly negative.   Hypoxia 07/23/2017   Lupus ([place])    Lymphedema of breast    Personal history of radiation therapy 2015   Pneumonia of left lung due to infectious organism    RBBB    Renal vein thrombosis (HCC)    SBO (small bowel obstruction) ([place]) 09/17/2017   Secondary hyperparathyroidism ([place])    Stroke ([place])    Incidental finding MRI 2002 L lacunar infarct   Vitamin B12 deficiency    Vitamin D deficiency    Wears dentures    top    SURGICAL HISTORY: Past Surgical History:  Procedure Laterality Date   ABDOMINAL HYSTERECTOMY     BILATERAL SALPINGOOPHORECTOMY  8/03   Lap BSO (R ovarian fibroma) and adhesion lysis   BIOPSY  09/08/2020   Procedure: BIOPSY;  Surgeon: Ronald Lobo, MD;  Location: [facility];  Service: Endoscopy;;   BOWEL  RESECTION  2003   ileocolectomy with anastomosis 2/2 adhesions   BREAST BIOPSY Right 2015   BREAST BIOPSY Right 07/2014   BREAST LUMPECTOMY Right 04/22/2013   BREAST LUMPECTOMY WITH NEEDLE LOCALIZATION AND AXILLARY SENTINEL LYMPH NODE BX Right 04/22/2013   Procedure: BREAST LUMPECTOMY WITH NEEDLE LOCALIZATION AND AXILLARY SENTINEL LYMPH NODE BX;  Surgeon: Stark Klein, MD;  Location: [facility];  Service: General;  Laterality: Right;   CARDIAC CATHETERIZATION     2 stents   CHOLECYSTECTOMY     COLONOSCOPY     ESOPHAGOGASTRODUODENOSCOPY N/A 09/08/2020   Procedure: ESOPHAGOGASTRODUODENOSCOPY (EGD);  Surgeon: Ronald Lobo, MD;  Location: [facility];  Service: Endoscopy;  Laterality: N/A;   HEMICOLECTOMY     R sided hemicolectomy   HERNIA REPAIR     Ventral hernia repair   PTCA  4/06    ALLERGIES:  is allergic to percocet [oxycodone-acetaminophen]; haloperidol lactate; lorazepam; morphine sulfate; propoxyphene hcl; tramadol hcl;  valium [diazepam]; anesthetics, halogenated; etomidate; fentanyl; haloperidol; morphine sulfate; propoxyphene; tramadol hcl; and versed [midazolam].  MEDICATIONS:  Current Outpatient Medications  Medication Sig Dispense Refill   acetaminophen (TYLENOL) 325 MG tablet Take 2 tablets (650 mg total) by mouth every 6 (six) hours as needed for mild pain (or Fever >/= 101).     amLODipine (NORVASC) 10 MG tablet Take 1/2 tablets (5 mg total) by mouth daily. 15 tablet 0   apixaban (ELIQUIS) 2.5 MG TABS tablet Take 1 tablet (2.5 mg total) by mouth 2 (two) times daily. 60 tablet 0   budesonide (ENTOCORT EC) 3 MG 24 hr capsule Take 1 capsule by mouth once daily (Patient taking differently: Take 3 mg by mouth daily.) 30 capsule 0   ergocalciferol (VITAMIN D2) 1.25 MG (50000 UT) capsule Take 1 capsule (50,000 Units total) by mouth once a week. 4 capsule 1   famotidine (PEPCID) 20 MG tablet Take 10 mg by mouth at bedtime.     FLUoxetine (PROZAC) 20 MG capsule  Take 20 mg by mouth daily.     furosemide (LASIX) 40 MG tablet Take 1 tablet (40 mg total) by mouth daily. 30 tablet 0   lipase/protease/amylase (CREON) 36000 UNITS CPEP capsule Take 36,000 Units by mouth 3 (three) times daily with meals.     loperamide (IMODIUM A-D) 2 MG tablet Take 4 mg by mouth 4 (four) times daily as needed for diarrhea or loose stools.     mirtazapine (REMERON) 7.5 MG tablet Take 1 tablet (7.5 mg total) by mouth at bedtime. 90 tablet 1   nitroGLYCERIN (NITROSTAT) 0.4 MG SL tablet DISSOLVE 1 TABLET UNDER THE TONGUE EVERY 5 MINUTES AS  NEEDED FOR CHEST PAIN. MAX  OF 3 TABLETS IN 15 MINUTES. CALL 911 IF PAIN PERSISTS. (Patient taking differently: Place 0.4 mg under the tongue every 5 (five) minutes as needed for chest pain.) 25 tablet 0   ondansetron (ZOFRAN-ODT) 4 MG disintegrating tablet DISSOLVE 1 TABLET IN MOUTH EVERY 8 HOURS AS NEEDED FOR NAUSEA OR VOMITING 20 tablet 0   pantoprazole (PROTONIX) 40 MG tablet TAKE 1 TABLET BY MOUTH  DAILY BEFORE BREAKFAST (Patient taking differently: Take 40 mg by mouth daily.) 90 tablet 3   potassium chloride (KLOR-CON) 20 MEQ packet Take 1 tablet (20 mEq) by mouth daily. 30 packet 0   Cyanocobalamin (B-12 COMPLIANCE INJECTION IJ) Inject 1 vial as directed every 30 (thirty) days. (Patient not taking: Reported on 11/24/2021)     No current facility-administered medications for this visit.    PHYSICAL EXAMINATION: ECOG PERFORMANCE STATUS: 1 - Symptomatic but completely ambulatory  Vitals:   11/24/21 1344  BP: (!) 121/53  Pulse: 76  Resp: 16  Temp: 97.9 F (36.6 C)  SpO2: 97%   Wt Readings from Last 3 Encounters:  11/24/21 144 lb 8 oz (65.5 kg)  11/08/21 143 lb 1.3 oz (64.9 kg)  10/30/21 133 lb 4.8 oz (60.5 kg)     GENERAL:alert, no distress and comfortable SKIN: skin color normal, no rashes or significant lesions EYES: normal, Conjunctiva are pink and non-injected, sclera clear  NEURO: alert & oriented x 3 with fluent  speech  LABORATORY DATA:  I have reviewed the data as listed    Latest Ref Rng & Units 11/24/2021    1:29 PM 11/09/2021    3:07 PM 11/08/2021    5:08 AM  CBC  WBC 4.0 - 10.5 K/uL 7.0  10.1  9.9   Hemoglobin 12.0 - 15.0 g/dL 9.4  8.5  8.3   Hematocrit 36.0 - 46.0 % 30.3  26.5  26.9   Platelets 150 - 400 K/uL 179  224  231         Latest Ref Rng & Units 11/08/2021    5:08 AM 11/07/2021    3:09 AM 11/05/2021    3:15 PM  CMP  Glucose 70 - 99 mg/dL 80  93  118   BUN 8 - 23 mg/dL 77  93  120   Creatinine 0.44 - 1.00 mg/dL 2.29  2.64  4.12   Sodium 135 - 145 mmol/L 144  146  141   Potassium 3.5 - 5.1 mmol/L 3.6  3.8  3.6   Chloride 98 - 111 mmol/L 114  114  107   CO2 22 - 32 mmol/L 23  21  23    Calcium 8.9 - 10.3 mg/dL 9.4  9.1  9.3       RADIOGRAPHIC STUDIES: I have personally reviewed the radiological images as listed and agreed with the findings in the report. No results found.    ASSESSMENT & PLAN:  [name] is a 76 y.o. female with   1. Anemia of chronic disease (CKD stage IV), Iron and B12 deficiency and GI bleeding  -Her 04/2018 Colonoscopy with Dr Carlean Purl showed no evidence of GI bleeding or source of bleeding. Her anemia is probably related to her CKD, however her anemia is out of proportion of her stage IV CKD related anemia.  -Her 09/09/18 bone marrow biopsy results showed hypercellular marrow, no evidence of MDS or other concerns.  -Current supportive therapy includes IV iron as needed for ferritin <100,  B12 injections every 6 weeks and Aranesp every 2 weeks.   2. Breast cancer of the lower inner quadrant of the right breast, invasive ductal carcinoma with DCIS, pT1bN0Mo, stage IA,   ER+ and PR +, HER 2 negative -diagnosed 02/2013, s/p right lumpectomy, adjuvant radiation and Anastrozole for 4 years until 07/2017.  Anastrozole was stopped after she developed septic shock from pneumonia and a cardiac arrest, and we decided not restart due to the risk of cardiac  toxicity. -Her 07/11/20 Mammogram showed suspicious right breast mass. She underwent breast biopsy on 07/28/20 which showed fibroadipose and necrosis, no malignancy. This area is palpable on exam today. We will continue to monitor. -she is clinically doing well. Next mammogram scheduled for 07/12/21.   3. Comorbidities: DM, HTN, CAD; Chronic renal insufficieny, CKD stage IV -Continue medications managed by PCP and Dr. Johnsie Cancel the cardiologist.  -She follows up with Dr. Hollie Salk for her kidney function. -She was hospitalized for CHF twice in 07/2020.   4. Genetics results negative    5. Bone Health, Low Back pain, from degenerative arthritis and spinal stenosis -DEXA from 08/08/20 was normal -Her VitD level is low. She will continue 1000u daily.  -back pain is tolerable and stable. Continue f/u with Orthopedics    6. Left Renal Vein Thrombosis in 07/2017 -Likely provoked from acute illness and prior SBO -She stopped Xarelto in 01/2018 -Her hypercoagulopathy work-up in November 2019 was negative   7. History of pneumonia and cardiac arrest in 07/2017   8. Covid-19+ 11/01/20, 03/26/20     PLAN: -Proceed with Aranesp today -Lab and Aranesp every 2 weeks and B12 every 6 weeks, will give IV Iron if Ferritin less than 100.  -mammogram scheduled for 07/12/21 -F/u in 12 weeks to see Dr. Burr Medico  All questions were answered. The patient knows to call the clinic  with any problems, questions or concerns. No barriers to learning was detected.  .I have spent a total of 30 minutes minutes of face-to-face and non-face-to-face time, preparing to see the patient, performing a medically appropriate examination, counseling and educating the patient, ordering medications, documenting clinical information in the electronic health record,  and care coordination.   Dede Query PA-C Dept of Hematology and [place] at [facility] Phone: [phone]

## 2021-11-28 ENCOUNTER — Telehealth: Payer: Self-pay | Admitting: Hematology

## 2021-11-28 NOTE — Telephone Encounter (Signed)
Per 9/8 los called and left message for pt

## 2021-12-01 DIAGNOSIS — N184 Chronic kidney disease, stage 4 (severe): Secondary | ICD-10-CM | POA: Diagnosis not present

## 2021-12-01 DIAGNOSIS — R413 Other amnesia: Secondary | ICD-10-CM | POA: Diagnosis not present

## 2021-12-08 ENCOUNTER — Inpatient Hospital Stay: Payer: Medicare HMO

## 2021-12-08 ENCOUNTER — Other Ambulatory Visit: Payer: Self-pay

## 2021-12-08 DIAGNOSIS — D464 Refractory anemia, unspecified: Secondary | ICD-10-CM

## 2021-12-08 DIAGNOSIS — D631 Anemia in chronic kidney disease: Secondary | ICD-10-CM

## 2021-12-08 DIAGNOSIS — E538 Deficiency of other specified B group vitamins: Secondary | ICD-10-CM

## 2021-12-08 DIAGNOSIS — Z79899 Other long term (current) drug therapy: Secondary | ICD-10-CM | POA: Diagnosis not present

## 2021-12-08 DIAGNOSIS — N184 Chronic kidney disease, stage 4 (severe): Secondary | ICD-10-CM | POA: Diagnosis not present

## 2021-12-08 DIAGNOSIS — D509 Iron deficiency anemia, unspecified: Secondary | ICD-10-CM

## 2021-12-08 LAB — CBC WITH DIFFERENTIAL (CANCER CENTER ONLY)
Abs Immature Granulocytes: 0.02 10*3/uL (ref 0.00–0.07)
Basophils Absolute: 0 10*3/uL (ref 0.0–0.1)
Basophils Relative: 1 %
Eosinophils Absolute: 0 10*3/uL (ref 0.0–0.5)
Eosinophils Relative: 0 %
HCT: 36.9 % (ref 36.0–46.0)
Hemoglobin: 11.6 g/dL — ABNORMAL LOW (ref 12.0–15.0)
Immature Granulocytes: 0 %
Lymphocytes Relative: 15 %
Lymphs Abs: 0.9 10*3/uL (ref 0.7–4.0)
MCH: 28.8 pg (ref 26.0–34.0)
MCHC: 31.4 g/dL (ref 30.0–36.0)
MCV: 91.6 fL (ref 80.0–100.0)
Monocytes Absolute: 0.5 10*3/uL (ref 0.1–1.0)
Monocytes Relative: 9 %
Neutro Abs: 4.7 10*3/uL (ref 1.7–7.7)
Neutrophils Relative %: 75 %
Platelet Count: 267 10*3/uL (ref 150–400)
RBC: 4.03 MIL/uL (ref 3.87–5.11)
RDW: 16.9 % — ABNORMAL HIGH (ref 11.5–15.5)
WBC Count: 6.2 10*3/uL (ref 4.0–10.5)
nRBC: 0 % (ref 0.0–0.2)

## 2021-12-08 LAB — SAMPLE TO BLOOD BANK

## 2021-12-08 LAB — VITAMIN B12: Vitamin B-12: 1182 pg/mL — ABNORMAL HIGH (ref 180–914)

## 2021-12-08 NOTE — Progress Notes (Signed)
Pt is here for Aranesp injection today. Her Hgb is 11.6 therefore she does not meet the parameters to receive this injection today. Her B12 injection is Q6 weeks and isn't due for another 2 weeks. No shots to give today. Pt has labs and verbalized understanding.

## 2021-12-22 ENCOUNTER — Inpatient Hospital Stay: Payer: Medicare HMO | Attending: Hematology

## 2021-12-22 ENCOUNTER — Other Ambulatory Visit: Payer: Self-pay

## 2021-12-22 ENCOUNTER — Inpatient Hospital Stay: Payer: Medicare HMO

## 2021-12-22 DIAGNOSIS — N184 Chronic kidney disease, stage 4 (severe): Secondary | ICD-10-CM | POA: Insufficient documentation

## 2021-12-22 DIAGNOSIS — D631 Anemia in chronic kidney disease: Secondary | ICD-10-CM | POA: Diagnosis not present

## 2021-12-22 DIAGNOSIS — D509 Iron deficiency anemia, unspecified: Secondary | ICD-10-CM

## 2021-12-22 DIAGNOSIS — Z853 Personal history of malignant neoplasm of breast: Secondary | ICD-10-CM | POA: Diagnosis not present

## 2021-12-22 DIAGNOSIS — E538 Deficiency of other specified B group vitamins: Secondary | ICD-10-CM | POA: Insufficient documentation

## 2021-12-22 DIAGNOSIS — D464 Refractory anemia, unspecified: Secondary | ICD-10-CM

## 2021-12-22 LAB — CBC WITH DIFFERENTIAL (CANCER CENTER ONLY)
Abs Immature Granulocytes: 0.13 10*3/uL — ABNORMAL HIGH (ref 0.00–0.07)
Basophils Absolute: 0 10*3/uL (ref 0.0–0.1)
Basophils Relative: 0 %
Eosinophils Absolute: 0 10*3/uL (ref 0.0–0.5)
Eosinophils Relative: 0 %
HCT: 34.7 % — ABNORMAL LOW (ref 36.0–46.0)
Hemoglobin: 11.2 g/dL — ABNORMAL LOW (ref 12.0–15.0)
Immature Granulocytes: 1 %
Lymphocytes Relative: 12 %
Lymphs Abs: 1.2 10*3/uL (ref 0.7–4.0)
MCH: 28.9 pg (ref 26.0–34.0)
MCHC: 32.3 g/dL (ref 30.0–36.0)
MCV: 89.4 fL (ref 80.0–100.0)
Monocytes Absolute: 0.7 10*3/uL (ref 0.1–1.0)
Monocytes Relative: 7 %
Neutro Abs: 7.8 10*3/uL — ABNORMAL HIGH (ref 1.7–7.7)
Neutrophils Relative %: 80 %
Platelet Count: 245 10*3/uL (ref 150–400)
RBC: 3.88 MIL/uL (ref 3.87–5.11)
RDW: 16.2 % — ABNORMAL HIGH (ref 11.5–15.5)
WBC Count: 9.9 10*3/uL (ref 4.0–10.5)
nRBC: 0 % (ref 0.0–0.2)

## 2021-12-22 LAB — SAMPLE TO BLOOD BANK

## 2021-12-22 MED ORDER — CYANOCOBALAMIN 1000 MCG/ML IJ SOLN
1000.0000 ug | Freq: Once | INTRAMUSCULAR | Status: AC
  Administered 2021-12-22: 1000 ug via INTRAMUSCULAR
  Filled 2021-12-22: qty 1

## 2021-12-22 NOTE — Progress Notes (Signed)
Patient here for Aranesp and B12 injections. Her Hgb is 11.2 therefore she does not meet the parameters to receive Aranesp today. B12 injection given. AVS and labs printed.

## 2021-12-23 ENCOUNTER — Other Ambulatory Visit: Payer: Self-pay

## 2021-12-23 ENCOUNTER — Encounter (HOSPITAL_BASED_OUTPATIENT_CLINIC_OR_DEPARTMENT_OTHER): Payer: Self-pay

## 2021-12-23 ENCOUNTER — Emergency Department (HOSPITAL_BASED_OUTPATIENT_CLINIC_OR_DEPARTMENT_OTHER): Payer: Medicare HMO

## 2021-12-23 ENCOUNTER — Emergency Department (HOSPITAL_BASED_OUTPATIENT_CLINIC_OR_DEPARTMENT_OTHER)
Admission: EM | Admit: 2021-12-23 | Discharge: 2021-12-23 | Disposition: A | Discharge status: Home / Self Care | Payer: Medicare HMO | Source: Home / Self Care | Attending: Emergency Medicine | Admitting: Emergency Medicine

## 2021-12-23 DIAGNOSIS — I130 Hypertensive heart and chronic kidney disease with heart failure and stage 1 through stage 4 chronic kidney disease, or unspecified chronic kidney disease: Secondary | ICD-10-CM | POA: Insufficient documentation

## 2021-12-23 DIAGNOSIS — N179 Acute kidney failure, unspecified: Secondary | ICD-10-CM | POA: Diagnosis not present

## 2021-12-23 DIAGNOSIS — Z853 Personal history of malignant neoplasm of breast: Secondary | ICD-10-CM | POA: Insufficient documentation

## 2021-12-23 DIAGNOSIS — R1084 Generalized abdominal pain: Secondary | ICD-10-CM

## 2021-12-23 DIAGNOSIS — N189 Chronic kidney disease, unspecified: Secondary | ICD-10-CM | POA: Insufficient documentation

## 2021-12-23 DIAGNOSIS — Z79899 Other long term (current) drug therapy: Secondary | ICD-10-CM | POA: Insufficient documentation

## 2021-12-23 DIAGNOSIS — Z7901 Long term (current) use of anticoagulants: Secondary | ICD-10-CM | POA: Insufficient documentation

## 2021-12-23 DIAGNOSIS — R0602 Shortness of breath: Secondary | ICD-10-CM | POA: Diagnosis not present

## 2021-12-23 DIAGNOSIS — I509 Heart failure, unspecified: Secondary | ICD-10-CM | POA: Insufficient documentation

## 2021-12-23 DIAGNOSIS — N289 Disorder of kidney and ureter, unspecified: Secondary | ICD-10-CM | POA: Diagnosis not present

## 2021-12-23 DIAGNOSIS — E119 Type 2 diabetes mellitus without complications: Secondary | ICD-10-CM | POA: Insufficient documentation

## 2021-12-23 LAB — COMPREHENSIVE METABOLIC PANEL
ALT: 10 U/L (ref 0–44)
AST: 14 U/L — ABNORMAL LOW (ref 15–41)
Albumin: 4 g/dL (ref 3.5–5.0)
Alkaline Phosphatase: 83 U/L (ref 38–126)
Anion gap: 12 (ref 5–15)
BUN: 54 mg/dL — ABNORMAL HIGH (ref 8–23)
CO2: 23 mmol/L (ref 22–32)
Calcium: 9.8 mg/dL (ref 8.9–10.3)
Chloride: 104 mmol/L (ref 98–111)
Creatinine, Ser: 2.17 mg/dL — ABNORMAL HIGH (ref 0.44–1.00)
GFR, Estimated: 23 mL/min — ABNORMAL LOW (ref >60)
Glucose, Bld: 87 mg/dL (ref 70–99)
Potassium: 4.7 mmol/L (ref 3.5–5.1)
Sodium: 139 mmol/L (ref 135–145)
Total Bilirubin: 0.3 mg/dL (ref 0.3–1.2)
Total Protein: 8 g/dL (ref 6.5–8.1)

## 2021-12-23 LAB — URINALYSIS, ROUTINE W REFLEX MICROSCOPIC
Bilirubin Urine: NEGATIVE
Glucose, UA: NEGATIVE mg/dL
Hgb urine dipstick: NEGATIVE
Ketones, ur: NEGATIVE mg/dL
Leukocytes,Ua: NEGATIVE
Nitrite: NEGATIVE
Protein, ur: 30 mg/dL — AB
Specific Gravity, Urine: 1.01 (ref 1.005–1.030)
pH: 5 (ref 5.0–8.0)

## 2021-12-23 LAB — CBC
HCT: 37 % (ref 36.0–46.0)
Hemoglobin: 11.8 g/dL — ABNORMAL LOW (ref 12.0–15.0)
MCH: 28.2 pg (ref 26.0–34.0)
MCHC: 31.9 g/dL (ref 30.0–36.0)
MCV: 88.5 fL (ref 80.0–100.0)
Platelets: 261 10*3/uL (ref 150–400)
RBC: 4.18 MIL/uL (ref 3.87–5.11)
RDW: 15.9 % — ABNORMAL HIGH (ref 11.5–15.5)
WBC: 11.9 10*3/uL — ABNORMAL HIGH (ref 4.0–10.5)
nRBC: 0 % (ref 0.0–0.2)

## 2021-12-23 LAB — LIPASE, BLOOD: Lipase: 24 U/L (ref 11–51)

## 2021-12-23 MED ORDER — MORPHINE SULFATE (PF) 4 MG/ML IV SOLN: 4.0000 mg | Freq: Once | INTRAVENOUS | Status: DC

## 2021-12-23 MED ORDER — MORPHINE SULFATE (PF) 2 MG/ML IV SOLN
2.0000 mg | Freq: Once | INTRAVENOUS | Status: AC
  Administered 2021-12-23: 2 mg via INTRAVENOUS
  Filled 2021-12-23: qty 1

## 2021-12-23 MED ORDER — ONDANSETRON HCL 4 MG/2ML IJ SOLN
4.0000 mg | Freq: Once | INTRAMUSCULAR | Status: AC
  Administered 2021-12-23: 4 mg via INTRAVENOUS
  Filled 2021-12-23: qty 2

## 2021-12-23 MED ORDER — PANTOPRAZOLE SODIUM 40 MG PO TBEC: 40.0000 mg | DELAYED_RELEASE_TABLET | Freq: Two times a day (BID) | ORAL | 0 refills | Status: DC

## 2021-12-23 MED ORDER — ONDANSETRON 4 MG PO TBDP: 4.0000 mg | ORAL_TABLET | Freq: Three times a day (TID) | ORAL | 0 refills | Status: DC | PRN

## 2021-12-23 NOTE — ED Notes (Signed)
Provider made aware of Pt's BP during her visit. Pt did stated she did not take her BP medication this morning.

## 2021-12-23 NOTE — ED Triage Notes (Signed)
Pt advised bilateral lower abd pain 7/10 on pain scale that began around 2:30am this morning. Pt endorses nausea but denies vomiting/diarrhea. Pt also c/o urinary frequency starting the same time. Denies pain/difficulty with urination

## 2021-12-23 NOTE — Discharge Instructions (Addendum)
Increase the Protonix to twice a day for 1 week to see if that helps with the abd pain.

## 2021-12-23 NOTE — ED Triage Notes (Signed)
Granddaughter advised hx of bowel obstructions. Granddaughter also concerned for pt's hx of CHF after pt told her yesterday she had gained nearly 15 lb over the past month

## 2021-12-23 NOTE — ED Notes (Signed)
Ambulated pt. O2 SAT 99% HR 81.

## 2021-12-23 NOTE — ED Provider Notes (Signed)
[facility] DEPT Provider Note   CSN: [id number] Arrival date & time: 12/23/21  1046     History  Chief Complaint  Patient presents with   Abdominal Pain   Nausea    [name] is a 76 y.o. female.  Pt is a 76 yo female with a pmhx significant for CHF, CKD, HTN, GERD, anemia, DM, HLD, breast cancer, lupus, sbo and renal vein thrombosis/DVT (on Eliquis).  Pt is here for abd pain and possible CHF exac.  Pt started having abd pain early this am around 0300.  Pt's granddaughter also said she's gained 15 lbs over the past month.  They did give pt an extra lasix yesterday.  Pt has had nausea, but no diarrhea.  She has been urinating a lot, but no pain with urination.  Pt denies sob or cp.       Home Medications Prior to Admission medications   Medication Sig Start Date End Date Taking? Authorizing Provider  ondansetron (ZOFRAN-ODT) 4 MG disintegrating tablet Take 1 tablet (4 mg total) by mouth every 8 (eight) hours as needed for nausea or vomiting. 12/23/21  Yes [name], MD  acetaminophen (TYLENOL) 325 MG tablet Take 2 tablets (650 mg total) by mouth every 6 (six) hours as needed for mild pain (or Fever >/= 101). 11/08/21   Elgergawy, Silver Huguenin, MD  amLODipine (NORVASC) 10 MG tablet Take 1/2 tablets (5 mg total) by mouth daily. 10/31/21 11/30/21  Arrien, Jimmy Picket, MD  apixaban (ELIQUIS) 2.5 MG TABS tablet Take 1 tablet (2.5 mg total) by mouth 2 (two) times daily. 10/30/21   Arrien, Jimmy Picket, MD  budesonide (ENTOCORT EC) 3 MG 24 hr capsule Take 1 capsule by mouth once daily Patient taking differently: Take 3 mg by mouth daily. 11/30/20   Gatha Mayer, MD  ergocalciferol (VITAMIN D2) 1.25 MG (50000 UT) capsule Take 1 capsule (50,000 Units total) by mouth once a week. 08/02/20   Truitt Merle, MD  famotidine (PEPCID) 20 MG tablet Take 10 mg by mouth at bedtime. 10/14/21   [provider]  FLUoxetine (PROZAC) 20 MG capsule Take 20 mg by  mouth daily. 10/12/21   [provider]  furosemide (LASIX) 40 MG tablet Take 1 tablet (40 mg total) by mouth daily. 11/26/21 12/26/21  Lincoln Brigham, PA-C  lipase/protease/amylase (CREON) 36000 UNITS CPEP capsule Take 36,000 Units by mouth 3 (three) times daily with meals.    [provider]  loperamide (IMODIUM A-D) 2 MG tablet Take 4 mg by mouth 4 (four) times daily as needed for diarrhea or loose stools.    [provider]  mirtazapine (REMERON) 7.5 MG tablet Take 1 tablet (7.5 mg total) by mouth at bedtime. 12/22/20   Truitt Merle, MD  nitroGLYCERIN (NITROSTAT) 0.4 MG SL tablet DISSOLVE 1 TABLET UNDER THE TONGUE EVERY 5 MINUTES AS  NEEDED FOR CHEST PAIN. MAX  OF 3 TABLETS IN 15 MINUTES. CALL 911 IF PAIN PERSISTS. Patient taking differently: Place 0.4 mg under the tongue every 5 (five) minutes as needed for chest pain. 10/04/20   Jerline Pain, MD  pantoprazole (PROTONIX) 40 MG tablet Take 1 tablet (40 mg total) by mouth 2 (two) times daily. 12/23/21   [name], MD  potassium chloride (KLOR-CON) 20 MEQ packet Take 1 tablet (20 mEq) by mouth daily. 10/31/21 11/30/21  Arrien, Jimmy Picket, MD      Allergies    Percocet [oxycodone-acetaminophen]; Haloperidol lactate; Lorazepam; Morphine sulfate; Propoxyphene hcl; Tramadol  hcl; Valium [diazepam]; Anesthetics, halogenated; Etomidate; Fentanyl; Haloperidol; Morphine sulfate; Propoxyphene; Tramadol hcl; and Versed [midazolam]    Review of Systems   Review of Systems  Gastrointestinal:  Positive for abdominal pain and nausea.  All other systems reviewed and are negative.   Physical Exam Updated Vital Signs BP (!) 201/92   Pulse 79   Temp 98.3 F (36.8 C) (Oral)   Resp 16   Ht 5' 1"  (1.549 m)   Wt 68.8 kg   SpO2 100%   BMI 28.64 kg/m  Physical Exam Vitals and nursing note reviewed.  Constitutional:      Appearance: She is well-developed.  HENT:     Head: Normocephalic and atraumatic.     Mouth/Throat:      Mouth: Mucous membranes are moist.     Pharynx: Oropharynx is clear.  Eyes:     Extraocular Movements: Extraocular movements intact.     Pupils: Pupils are equal, round, and reactive to light.  Cardiovascular:     Rate and Rhythm: Normal rate and regular rhythm.     Heart sounds: Normal heart sounds.  Pulmonary:     Effort: Pulmonary effort is normal.     Breath sounds: Normal breath sounds.  Abdominal:     General: Abdomen is flat. Bowel sounds are normal.     Palpations: Abdomen is soft.     Tenderness: There is generalized abdominal tenderness.  Skin:    General: Skin is warm.     Capillary Refill: Capillary refill takes less than 2 seconds.  Neurological:     General: No focal deficit present.     Mental Status: She is alert and oriented to person, place, and time.  Psychiatric:        Mood and Affect: Mood normal.        Behavior: Behavior normal.     ED Results / Procedures / Treatments   Labs (all labs ordered are listed, but only abnormal results are displayed) Labs Reviewed  COMPREHENSIVE METABOLIC PANEL - Abnormal; Notable for the following components:      Result Value   BUN 54 (*)    Creatinine, Ser 2.17 (*)    AST 14 (*)    GFR, Estimated 23 (*)    All other components within normal limits  CBC - Abnormal; Notable for the following components:   WBC 11.9 (*)    Hemoglobin 11.8 (*)    RDW 15.9 (*)    All other components within normal limits  URINALYSIS, ROUTINE W REFLEX MICROSCOPIC - Abnormal; Notable for the following components:   Color, Urine COLORLESS (*)    Protein, ur 30 (*)    Bacteria, UA RARE (*)    All other components within normal limits  LIPASE, BLOOD    EKG None  Radiology CT ABDOMEN PELVIS WO CONTRAST  Result Date: 12/23/2021 CLINICAL DATA:  Lower abdominal pain, nausea EXAM: CT ABDOMEN AND PELVIS WITHOUT CONTRAST TECHNIQUE: Multidetector CT imaging of the abdomen and pelvis was performed following the standard protocol without IV  contrast. RADIATION DOSE REDUCTION: This exam was performed according to the departmental dose-optimization program which includes automated exposure control, adjustment of the mA and/or kV according to patient size and/or use of iterative reconstruction technique. COMPARISON:  CT done on 12/02/2020, abdomen sonogram done on 04/17/2021 FINDINGS: Lower chest: Visualized lower lung fields are clear. Minimal pericardial effusion is present. Possible coronary artery calcifications are seen. Hepatobiliary: There is small subcentimeter low-density focus in the left lobe, possibly cyst  or hemangioma. There is no dilation of intrahepatic bile ducts. Surgical clips are seen in gallbladder fossa. Pancreas: No focal abnormalities are seen. Evaluation is limited in this noncontrast study. Spleen: Unremarkable. Adrenals/Urinary Tract: Adrenals are unremarkable. There is no hydronephrosis. There are no renal or ureteral stones. There are a few low-density lesions in the renal cortex on both sides largest measuring 2.5 cm in the midportion of right kidney. Findings suggest bilateral renal cysts. No follow-up imaging is recommended. Ureters are not dilated. Urinary bladder is not distended. Stomach/Bowel: Small hiatal hernia is seen. Small bowel loops are not dilated. Appendix is not seen. There is no pericecal inflammation. There is no significant wall thickening in colon. There is no pericolic stranding. Vascular/Lymphatic: Scattered arterial calcifications are seen in aorta and its major branches. Reproductive: Uterus is not seen. Other: There is no ascites or pneumoperitoneum. Musculoskeletal: Degenerative changes are noted in lumbar spine with encroachment of neural foramina at multiple levels, more severe at L4-L5 level. IMPRESSION: There is no evidence of intestinal obstruction or pneumoperitoneum. There is no hydronephrosis. Small pericardial effusion. Small hiatal hernia. Arteriosclerosis. Lumbar spondylosis. Other  findings as described in the body of the report. Electronically Signed   By: Elmer Picker M.D.   On: 12/23/2021 15:34   DG Chest Portable 1 View  Result Date: 12/23/2021 CLINICAL DATA:  Shortness of breath EXAM: PORTABLE CHEST 1 VIEW COMPARISON:  Chest x-ray November 05, 2021 FINDINGS: The heart size and mediastinal contours are within normal limits. Both lungs are clear. No large pleural effusion or pneumothorax. Surgical clips overlying the right axilla and right upper quadrant. No acute osseous abnormality. IMPRESSION: No acute cardiopulmonary abnormality. Electronically Signed   By: Beryle Flock M.D.   On: 12/23/2021 15:27    Procedures Procedures    Medications Ordered in ED Medications  ondansetron (ZOFRAN) injection 4 mg (4 mg Intravenous Given 12/23/21 1607)  morphine (PF) 2 MG/ML injection 2 mg (2 mg Intravenous Given 12/23/21 1607)    ED Course/ Medical Decision Making/ A&P                           Medical Decision Making Amount and/or Complexity of Data Reviewed Labs: ordered. Radiology: ordered.  Risk Prescription drug management.   This patient presents to the ED for concern of abd pain, this involves an extensive number of treatment options, and is a complaint that carries with it a high risk of complications and morbidity.  The differential diagnosis includes pancreatitis, gastritis, uti, other infection   Co morbidities that complicate the patient evaluation  CHF, CKD, HTN, GERD, anemia, DM, HLD, breast cancer, lupus, sbo and renal vein thrombosis/DVT (on Eliquis)   Additional history obtained:  Additional history obtained from epic chart review External records from outside source obtained and reviewed including family   Lab Tests:  I Ordered, and personally interpreted labs.  The pertinent results include:  ua neg; lip nl; cmp with cr 2.17 (cr 2.29 on 8/23), cbc 11.9, hgb 11.8   Imaging Studies ordered:  I ordered imaging studies including  CXR and CT abd/pelvis  I independently visualized and interpreted imaging which showed  CXR: IMPRESSION:  No acute cardiopulmonary abnormality.  CT abd/pelvis: IMPRESSION:  There is no evidence of intestinal obstruction or pneumoperitoneum.  There is no hydronephrosis.    Small pericardial effusion. Small hiatal hernia. Arteriosclerosis.  Lumbar spondylosis.    Other findings as described in the body of the report.  I agree with the radiologist interpretation   Cardiac Monitoring:  The patient was maintained on a cardiac monitor.  I personally viewed and interpreted the cardiac monitored which showed an underlying rhythm of: nsr   Medicines ordered and prescription drug management:  I ordered medication including morphine and zofran  for pain and nausea  Reevaluation of the patient after these medicines showed that the patient improved I have reviewed the patients home medicines and have made adjustments as needed   Test Considered:  ct   Critical Interventions:  Pain control   Problem List / ED Course:  Abd pain:  labs without anything acute.  CT neg for anything acute.  Pt is feeling better.  I will increase protonix to bid.  Pt is to return if worse.  F/u with pcp/GI. ? CHF:  no evidence of CHF.  Pt able to ambulate with good O2 sats.   Reevaluation:  After the interventions noted above, I reevaluated the patient and found that they have :improved   Social Determinants of Health:  Lives at home   Dispostion:  After consideration of the diagnostic results and the patients response to treatment, I feel that the patent would benefit from discharge with outpatient f/u.          Final Clinical Impression(s) / ED Diagnoses Final diagnoses:  Generalized abdominal pain    Rx / DC Orders ED Discharge Orders          Ordered    ondansetron (ZOFRAN-ODT) 4 MG disintegrating tablet  Every 8 hours PRN        12/23/21 1720    pantoprazole  (PROTONIX) 40 MG tablet  2 times daily       Note to Pharmacy: Requesting 1 year supply   12/23/21 Geraldine Solar, MD 12/23/21 1726

## 2021-12-25 ENCOUNTER — Other Ambulatory Visit: Payer: Self-pay

## 2021-12-25 ENCOUNTER — Emergency Department (HOSPITAL_BASED_OUTPATIENT_CLINIC_OR_DEPARTMENT_OTHER): Payer: Medicare HMO | Admitting: Radiology

## 2021-12-25 ENCOUNTER — Inpatient Hospital Stay (HOSPITAL_BASED_OUTPATIENT_CLINIC_OR_DEPARTMENT_OTHER)
Admission: EM | Admit: 2021-12-25 | Discharge: 2021-12-28 | DRG: 683 | Disposition: A | Discharge status: Home / Self Care | Payer: Medicare HMO | Attending: Family Medicine | Admitting: Family Medicine

## 2021-12-25 ENCOUNTER — Encounter (HOSPITAL_COMMUNITY): Payer: Self-pay

## 2021-12-25 ENCOUNTER — Encounter (HOSPITAL_BASED_OUTPATIENT_CLINIC_OR_DEPARTMENT_OTHER): Payer: Self-pay | Admitting: *Deleted

## 2021-12-25 DIAGNOSIS — I3139 Other pericardial effusion (noninflammatory): Secondary | ICD-10-CM | POA: Diagnosis present

## 2021-12-25 DIAGNOSIS — Z7901 Long term (current) use of anticoagulants: Secondary | ICD-10-CM

## 2021-12-25 DIAGNOSIS — E559 Vitamin D deficiency, unspecified: Secondary | ICD-10-CM | POA: Diagnosis present

## 2021-12-25 DIAGNOSIS — T502X5A Adverse effect of carbonic-anhydrase inhibitors, benzothiadiazides and other diuretics, initial encounter: Secondary | ICD-10-CM | POA: Diagnosis present

## 2021-12-25 DIAGNOSIS — I2510 Atherosclerotic heart disease of native coronary artery without angina pectoris: Secondary | ICD-10-CM | POA: Diagnosis present

## 2021-12-25 DIAGNOSIS — Z800 Family history of malignant neoplasm of digestive organs: Secondary | ICD-10-CM

## 2021-12-25 DIAGNOSIS — Z20822 Contact with and (suspected) exposure to covid-19: Secondary | ICD-10-CM | POA: Diagnosis present

## 2021-12-25 DIAGNOSIS — Z888 Allergy status to other drugs, medicaments and biological substances status: Secondary | ICD-10-CM

## 2021-12-25 DIAGNOSIS — E1122 Type 2 diabetes mellitus with diabetic chronic kidney disease: Secondary | ICD-10-CM | POA: Diagnosis present

## 2021-12-25 DIAGNOSIS — F32A Depression, unspecified: Secondary | ICD-10-CM | POA: Diagnosis present

## 2021-12-25 DIAGNOSIS — D631 Anemia in chronic kidney disease: Secondary | ICD-10-CM | POA: Diagnosis present

## 2021-12-25 DIAGNOSIS — Z90710 Acquired absence of both cervix and uterus: Secondary | ICD-10-CM

## 2021-12-25 DIAGNOSIS — N2581 Secondary hyperparathyroidism of renal origin: Secondary | ICD-10-CM | POA: Diagnosis present

## 2021-12-25 DIAGNOSIS — N179 Acute kidney failure, unspecified: Principal | ICD-10-CM

## 2021-12-25 DIAGNOSIS — M109 Gout, unspecified: Secondary | ICD-10-CM | POA: Diagnosis present

## 2021-12-25 DIAGNOSIS — I10 Essential (primary) hypertension: Secondary | ICD-10-CM | POA: Diagnosis present

## 2021-12-25 DIAGNOSIS — Z833 Family history of diabetes mellitus: Secondary | ICD-10-CM

## 2021-12-25 DIAGNOSIS — I130 Hypertensive heart and chronic kidney disease with heart failure and stage 1 through stage 4 chronic kidney disease, or unspecified chronic kidney disease: Secondary | ICD-10-CM | POA: Diagnosis present

## 2021-12-25 DIAGNOSIS — R0609 Other forms of dyspnea: Principal | ICD-10-CM

## 2021-12-25 DIAGNOSIS — R42 Dizziness and giddiness: Secondary | ICD-10-CM | POA: Diagnosis not present

## 2021-12-25 DIAGNOSIS — K219 Gastro-esophageal reflux disease without esophagitis: Secondary | ICD-10-CM | POA: Diagnosis present

## 2021-12-25 DIAGNOSIS — Z803 Family history of malignant neoplasm of breast: Secondary | ICD-10-CM

## 2021-12-25 DIAGNOSIS — M25562 Pain in left knee: Secondary | ICD-10-CM | POA: Diagnosis present

## 2021-12-25 DIAGNOSIS — Z8673 Personal history of transient ischemic attack (TIA), and cerebral infarction without residual deficits: Secondary | ICD-10-CM

## 2021-12-25 DIAGNOSIS — I5032 Chronic diastolic (congestive) heart failure: Secondary | ICD-10-CM | POA: Diagnosis present

## 2021-12-25 DIAGNOSIS — Z86718 Personal history of other venous thrombosis and embolism: Secondary | ICD-10-CM

## 2021-12-25 DIAGNOSIS — E785 Hyperlipidemia, unspecified: Secondary | ICD-10-CM | POA: Diagnosis present

## 2021-12-25 DIAGNOSIS — N184 Chronic kidney disease, stage 4 (severe): Secondary | ICD-10-CM | POA: Diagnosis present

## 2021-12-25 DIAGNOSIS — Z801 Family history of malignant neoplasm of trachea, bronchus and lung: Secondary | ICD-10-CM

## 2021-12-25 DIAGNOSIS — Z853 Personal history of malignant neoplasm of breast: Secondary | ICD-10-CM

## 2021-12-25 DIAGNOSIS — E538 Deficiency of other specified B group vitamins: Secondary | ICD-10-CM | POA: Diagnosis present

## 2021-12-25 DIAGNOSIS — E119 Type 2 diabetes mellitus without complications: Secondary | ICD-10-CM | POA: Diagnosis present

## 2021-12-25 DIAGNOSIS — I951 Orthostatic hypotension: Secondary | ICD-10-CM

## 2021-12-25 DIAGNOSIS — Z862 Personal history of diseases of the blood and blood-forming organs and certain disorders involving the immune mechanism: Secondary | ICD-10-CM | POA: Diagnosis present

## 2021-12-25 DIAGNOSIS — Z8249 Family history of ischemic heart disease and other diseases of the circulatory system: Secondary | ICD-10-CM

## 2021-12-25 DIAGNOSIS — Z885 Allergy status to narcotic agent status: Secondary | ICD-10-CM

## 2021-12-25 DIAGNOSIS — E869 Volume depletion, unspecified: Secondary | ICD-10-CM | POA: Diagnosis present

## 2021-12-25 DIAGNOSIS — Z79899 Other long term (current) drug therapy: Secondary | ICD-10-CM

## 2021-12-25 DIAGNOSIS — Z923 Personal history of irradiation: Secondary | ICD-10-CM

## 2021-12-25 DIAGNOSIS — R0602 Shortness of breath: Secondary | ICD-10-CM | POA: Diagnosis not present

## 2021-12-25 DIAGNOSIS — M329 Systemic lupus erythematosus, unspecified: Secondary | ICD-10-CM | POA: Diagnosis present

## 2021-12-25 DIAGNOSIS — E1169 Type 2 diabetes mellitus with other specified complication: Secondary | ICD-10-CM | POA: Diagnosis present

## 2021-12-25 LAB — BASIC METABOLIC PANEL
Anion gap: 12 (ref 5–15)
BUN: 67 mg/dL — ABNORMAL HIGH (ref 8–23)
CO2: 23 mmol/L (ref 22–32)
Calcium: 9.1 mg/dL (ref 8.9–10.3)
Chloride: 103 mmol/L (ref 98–111)
Creatinine, Ser: 3.54 mg/dL — ABNORMAL HIGH (ref 0.44–1.00)
GFR, Estimated: 13 mL/min — ABNORMAL LOW (ref >60)
Glucose, Bld: 104 mg/dL — ABNORMAL HIGH (ref 70–99)
Potassium: 3.4 mmol/L — ABNORMAL LOW (ref 3.5–5.1)
Sodium: 138 mmol/L (ref 135–145)

## 2021-12-25 LAB — BRAIN NATRIURETIC PEPTIDE: B Natriuretic Peptide: 57.3 pg/mL (ref 0.0–100.0)

## 2021-12-25 LAB — CBC
HCT: 37.2 % (ref 36.0–46.0)
Hemoglobin: 11.7 g/dL — ABNORMAL LOW (ref 12.0–15.0)
MCH: 28.3 pg (ref 26.0–34.0)
MCHC: 31.5 g/dL (ref 30.0–36.0)
MCV: 89.9 fL (ref 80.0–100.0)
Platelets: 259 10*3/uL (ref 150–400)
RBC: 4.14 MIL/uL (ref 3.87–5.11)
RDW: 16 % — ABNORMAL HIGH (ref 11.5–15.5)
WBC: 9.2 10*3/uL (ref 4.0–10.5)
nRBC: 0 % (ref 0.0–0.2)

## 2021-12-25 LAB — TROPONIN I (HIGH SENSITIVITY)
Troponin I (High Sensitivity): 11 ng/L (ref <18)
Troponin I (High Sensitivity): 10 ng/L (ref <18)

## 2021-12-25 LAB — D-DIMER, QUANTITATIVE: D-Dimer, Quant: 3.47 ug/mL-FEU — ABNORMAL HIGH (ref 0.00–0.50)

## 2021-12-25 LAB — SARS CORONAVIRUS 2 BY RT PCR: SARS Coronavirus 2 by RT PCR: NEGATIVE

## 2021-12-25 MED ORDER — POTASSIUM CHLORIDE 20 MEQ PO PACK
20.0000 meq | PACK | Freq: Two times a day (BID) | ORAL | Status: DC
  Administered 2021-12-25 – 2021-12-26 (×2): 20 meq via ORAL
  Filled 2021-12-25 (×2): qty 1

## 2021-12-25 MED ORDER — SODIUM CHLORIDE 0.9 % IV SOLN: Freq: Once | INTRAVENOUS | Status: AC

## 2021-12-25 MED ORDER — SODIUM CHLORIDE 0.9 % IV BOLUS
500.0000 mL | Freq: Once | INTRAVENOUS | Status: AC
  Administered 2021-12-25: 500 mL via INTRAVENOUS

## 2021-12-25 NOTE — ED Triage Notes (Signed)
Pt is here for evaluation of SOB.  Pt has had dizziness and increasing sob since she was seen Saturday.  Pt has been hypertensive and granddaughter reports that pt is retaining fluid.  Granddaughter also reports that pt sounds hoarse. No CP at this time.

## 2021-12-25 NOTE — ED Provider Notes (Signed)
Assumed care of the patient from Dr. Mayra Neer.  Patient with worsening weakness, shortness of breath and fatigue.  Patient has not taken her blood pressure medication or Lasix in the last 3 days due to feeling poorly.  Her weight has not fluctuated in the last few days.  Patient denies any chest pain or abdominal pain.  She reports she has been eating.  She denies any infectious symptoms.  Patient's labs today are significant for recurrent AKI with creatinine of 3.5.  When patient left the hospital she was at 2.2.  She also has a blood pressure at 120/46 which family reports is significantly unusual because usually when she does not take her blood pressure medication her blood pressure is very elevated.  With standing blood pressure drops to 90 and patient feels lightheaded.  Patient's BNP is normal today at 50.  Concerned that she may be over diuresed.  We will give gentle fluid as patient does not appear overloaded today.  Bedside ultrasound does not show any sign of pericardial effusion that is significant in nature.  Findings discussed with the family.  Patient has been so weak at home family is concerned and did not feel that she is safe at home at this time.  We will hydrate and admit to ensure that renal function improves.  May need adjustment of her medications.   Blanchie Dessert, MD 12/25/21 [phone]

## 2021-12-25 NOTE — Discharge Instructions (Signed)
Thank you for coming to [facility] Emergency Department. You were seen for shortness of breath with exertion. We did an exam, labs, and imaging, and these showed no acute findings. Please follow up with your primary care provider within 1 week.   Do not hesitate to return to the ED or call 911 if you experience: -Worsening symptoms -Lightheadedness, passing out -Fevers/chills -Anything else that concerns you

## 2021-12-25 NOTE — ED Notes (Signed)
RT note: Order was placed for ambulating pulse ox, RN made this RT aware and upon arriving at room family member notified that pts, BP had dropped from previous reading after repositioning, RT to follow.

## 2021-12-25 NOTE — Progress Notes (Signed)
Plan of Care Note for accepted transfer   Patient: [name] MRN: [id number]   DOA: 12/25/2021  Facility requesting transfer: [facility] Requesting Provider: Dr. Maryan Rued Reason for transfer: orthostatic hypotension  Facility course: 76 year old female with chronic diastolic heart failure, hypertension, CKD 4, type 2 diabetes and hyperlipidemia presented with 2 days of increasing weakness, dizziness and shortness of breath.  Patient recently hospitalized towards the end of August with AKI on CKD suspected due to over diuresis.  Discharge creatinine of 3.33 at that time and presents with creatinine of 3.54 today.  Does not appear volume overloaded on exam per EDP.  BNP of 57, troponin of 11 with chest x-ray negative for any pulmonary edema.  She has discontinued her antihypertensive and Lasix for the past 3 days since she was feeling unwell.  Her daughter who is a psych PA says its not normal for her blood pressure to remain normotensive at 120/50 like it is today when she misses the medication.  SBP does drop to 90s on standing.  EDP believes she is over-diuresis and has given gentle fluid/   Plan of care: The patient is accepted for admission to [place]  unit, at [facility] or [facility] depending on bed availability EDP to continue care of patient while she remains in the  Author: Orene Desanctis, DO 12/25/2021  Check [email or web address] for on-call coverage.  Nursing staff, Please call [place] number on Amion as soon as patient's arrival, so appropriate admitting provider can evaluate the pt.

## 2021-12-26 ENCOUNTER — Inpatient Hospital Stay (HOSPITAL_COMMUNITY): Payer: Medicare HMO

## 2021-12-26 DIAGNOSIS — I2510 Atherosclerotic heart disease of native coronary artery without angina pectoris: Secondary | ICD-10-CM | POA: Diagnosis not present

## 2021-12-26 DIAGNOSIS — I3139 Other pericardial effusion (noninflammatory): Secondary | ICD-10-CM | POA: Diagnosis not present

## 2021-12-26 DIAGNOSIS — N184 Chronic kidney disease, stage 4 (severe): Secondary | ICD-10-CM | POA: Diagnosis not present

## 2021-12-26 DIAGNOSIS — Z800 Family history of malignant neoplasm of digestive organs: Secondary | ICD-10-CM | POA: Diagnosis not present

## 2021-12-26 DIAGNOSIS — I5032 Chronic diastolic (congestive) heart failure: Secondary | ICD-10-CM | POA: Diagnosis not present

## 2021-12-26 DIAGNOSIS — D631 Anemia in chronic kidney disease: Secondary | ICD-10-CM | POA: Diagnosis not present

## 2021-12-26 DIAGNOSIS — M79605 Pain in left leg: Secondary | ICD-10-CM | POA: Diagnosis not present

## 2021-12-26 DIAGNOSIS — K219 Gastro-esophageal reflux disease without esophagitis: Secondary | ICD-10-CM | POA: Diagnosis not present

## 2021-12-26 DIAGNOSIS — G8929 Other chronic pain: Secondary | ICD-10-CM | POA: Diagnosis not present

## 2021-12-26 DIAGNOSIS — Z7901 Long term (current) use of anticoagulants: Secondary | ICD-10-CM | POA: Diagnosis not present

## 2021-12-26 DIAGNOSIS — M7989 Other specified soft tissue disorders: Secondary | ICD-10-CM | POA: Diagnosis not present

## 2021-12-26 DIAGNOSIS — N179 Acute kidney failure, unspecified: Secondary | ICD-10-CM | POA: Diagnosis not present

## 2021-12-26 DIAGNOSIS — E869 Volume depletion, unspecified: Secondary | ICD-10-CM | POA: Diagnosis not present

## 2021-12-26 DIAGNOSIS — M329 Systemic lupus erythematosus, unspecified: Secondary | ICD-10-CM | POA: Diagnosis not present

## 2021-12-26 DIAGNOSIS — Z79899 Other long term (current) drug therapy: Secondary | ICD-10-CM | POA: Diagnosis not present

## 2021-12-26 DIAGNOSIS — E785 Hyperlipidemia, unspecified: Secondary | ICD-10-CM | POA: Diagnosis not present

## 2021-12-26 DIAGNOSIS — T502X5A Adverse effect of carbonic-anhydrase inhibitors, benzothiadiazides and other diuretics, initial encounter: Secondary | ICD-10-CM | POA: Diagnosis not present

## 2021-12-26 DIAGNOSIS — M25562 Pain in left knee: Secondary | ICD-10-CM | POA: Diagnosis not present

## 2021-12-26 DIAGNOSIS — E1169 Type 2 diabetes mellitus with other specified complication: Secondary | ICD-10-CM | POA: Diagnosis not present

## 2021-12-26 DIAGNOSIS — I130 Hypertensive heart and chronic kidney disease with heart failure and stage 1 through stage 4 chronic kidney disease, or unspecified chronic kidney disease: Secondary | ICD-10-CM | POA: Diagnosis not present

## 2021-12-26 DIAGNOSIS — Z853 Personal history of malignant neoplasm of breast: Secondary | ICD-10-CM | POA: Diagnosis not present

## 2021-12-26 DIAGNOSIS — Z20822 Contact with and (suspected) exposure to covid-19: Secondary | ICD-10-CM | POA: Diagnosis not present

## 2021-12-26 DIAGNOSIS — Z86718 Personal history of other venous thrombosis and embolism: Secondary | ICD-10-CM | POA: Diagnosis not present

## 2021-12-26 DIAGNOSIS — M109 Gout, unspecified: Secondary | ICD-10-CM | POA: Diagnosis not present

## 2021-12-26 DIAGNOSIS — E1122 Type 2 diabetes mellitus with diabetic chronic kidney disease: Secondary | ICD-10-CM | POA: Diagnosis not present

## 2021-12-26 DIAGNOSIS — F32A Depression, unspecified: Secondary | ICD-10-CM | POA: Diagnosis not present

## 2021-12-26 DIAGNOSIS — N2581 Secondary hyperparathyroidism of renal origin: Secondary | ICD-10-CM | POA: Diagnosis not present

## 2021-12-26 LAB — CBC WITH DIFFERENTIAL/PLATELET
Abs Immature Granulocytes: 0.07 10*3/uL (ref 0.00–0.07)
Basophils Absolute: 0 10*3/uL (ref 0.0–0.1)
Basophils Relative: 0 %
Eosinophils Absolute: 0.1 10*3/uL (ref 0.0–0.5)
Eosinophils Relative: 1 %
HCT: 32.8 % — ABNORMAL LOW (ref 36.0–46.0)
Hemoglobin: 10.4 g/dL — ABNORMAL LOW (ref 12.0–15.0)
Immature Granulocytes: 1 %
Lymphocytes Relative: 10 %
Lymphs Abs: 1 10*3/uL (ref 0.7–4.0)
MCH: 28.8 pg (ref 26.0–34.0)
MCHC: 31.7 g/dL (ref 30.0–36.0)
MCV: 90.9 fL (ref 80.0–100.0)
Monocytes Absolute: 0.7 10*3/uL (ref 0.1–1.0)
Monocytes Relative: 7 %
Neutro Abs: 7.7 10*3/uL (ref 1.7–7.7)
Neutrophils Relative %: 81 %
Platelets: 202 10*3/uL (ref 150–400)
RBC: 3.61 MIL/uL — ABNORMAL LOW (ref 3.87–5.11)
RDW: 16.2 % — ABNORMAL HIGH (ref 11.5–15.5)
WBC: 9.5 10*3/uL (ref 4.0–10.5)
nRBC: 0 % (ref 0.0–0.2)

## 2021-12-26 LAB — BASIC METABOLIC PANEL
Anion gap: 10 (ref 5–15)
BUN: 65 mg/dL — ABNORMAL HIGH (ref 8–23)
CO2: 23 mmol/L (ref 22–32)
Calcium: 8.7 mg/dL — ABNORMAL LOW (ref 8.9–10.3)
Chloride: 107 mmol/L (ref 98–111)
Creatinine, Ser: 3.67 mg/dL — ABNORMAL HIGH (ref 0.44–1.00)
GFR, Estimated: 12 mL/min — ABNORMAL LOW (ref >60)
Glucose, Bld: 90 mg/dL (ref 70–99)
Potassium: 3.9 mmol/L (ref 3.5–5.1)
Sodium: 140 mmol/L (ref 135–145)

## 2021-12-26 LAB — BRAIN NATRIURETIC PEPTIDE: B Natriuretic Peptide: 35.2 pg/mL (ref 0.0–100.0)

## 2021-12-26 LAB — MAGNESIUM: Magnesium: 1.3 mg/dL — ABNORMAL LOW (ref 1.7–2.4)

## 2021-12-26 LAB — GLUCOSE, CAPILLARY: Glucose-Capillary: 123 mg/dL — ABNORMAL HIGH (ref 70–99)

## 2021-12-26 MED ORDER — APIXABAN 2.5 MG PO TABS
2.5000 mg | ORAL_TABLET | Freq: Two times a day (BID) | ORAL | Status: DC
  Administered 2021-12-26 – 2021-12-28 (×5): 2.5 mg via ORAL
  Filled 2021-12-26 (×5): qty 1

## 2021-12-26 MED ORDER — ACETAMINOPHEN 650 MG RE SUPP: 650.0000 mg | Freq: Four times a day (QID) | RECTAL | Status: DC | PRN

## 2021-12-26 MED ORDER — AMLODIPINE BESYLATE 5 MG PO TABS
5.0000 mg | ORAL_TABLET | Freq: Every day | ORAL | Status: DC
  Administered 2021-12-27 – 2021-12-28 (×2): 5 mg via ORAL
  Filled 2021-12-26 (×2): qty 1

## 2021-12-26 MED ORDER — INFLUENZA VAC A&B SA ADJ QUAD 0.5 ML IM PRSY
0.5000 mL | PREFILLED_SYRINGE | INTRAMUSCULAR | Status: DC
  Filled 2021-12-26: qty 0.5

## 2021-12-26 MED ORDER — PROCHLORPERAZINE EDISYLATE 10 MG/2ML IJ SOLN: 5.0000 mg | INTRAMUSCULAR | Status: DC | PRN

## 2021-12-26 MED ORDER — MIRTAZAPINE 15 MG PO TABS
7.5000 mg | ORAL_TABLET | Freq: Every day | ORAL | Status: DC
  Administered 2021-12-26 – 2021-12-27 (×2): 7.5 mg via ORAL
  Filled 2021-12-26 (×3): qty 1

## 2021-12-26 MED ORDER — SODIUM CHLORIDE 0.9 % IV SOLN: INTRAVENOUS | Status: DC | PRN

## 2021-12-26 MED ORDER — PANTOPRAZOLE SODIUM 40 MG PO TBEC
40.0000 mg | DELAYED_RELEASE_TABLET | Freq: Two times a day (BID) | ORAL | Status: DC
  Administered 2021-12-26 – 2021-12-28 (×5): 40 mg via ORAL
  Filled 2021-12-26 (×5): qty 1

## 2021-12-26 MED ORDER — FAMOTIDINE 20 MG PO TABS
10.0000 mg | ORAL_TABLET | Freq: Every day | ORAL | Status: DC
  Administered 2021-12-26 – 2021-12-27 (×2): 10 mg via ORAL
  Filled 2021-12-26 (×2): qty 1

## 2021-12-26 MED ORDER — MAGNESIUM SULFATE 2 GM/50ML IV SOLN
2.0000 g | Freq: Once | INTRAVENOUS | Status: AC
  Administered 2021-12-26: 2 g via INTRAVENOUS
  Filled 2021-12-26: qty 50

## 2021-12-26 MED ORDER — ACETAMINOPHEN 325 MG PO TABS
650.0000 mg | ORAL_TABLET | Freq: Four times a day (QID) | ORAL | Status: DC | PRN
  Administered 2021-12-26 – 2021-12-28 (×5): 650 mg via ORAL
  Filled 2021-12-26 (×5): qty 2

## 2021-12-26 MED ORDER — POTASSIUM CHLORIDE 20 MEQ PO PACK
20.0000 meq | PACK | Freq: Every day | ORAL | Status: DC
  Administered 2021-12-27 – 2021-12-28 (×2): 20 meq via ORAL
  Filled 2021-12-26 (×2): qty 1

## 2021-12-26 MED ORDER — FLUOXETINE HCL 20 MG PO CAPS
20.0000 mg | ORAL_CAPSULE | Freq: Every day | ORAL | Status: DC
  Administered 2021-12-26 – 2021-12-28 (×3): 20 mg via ORAL
  Filled 2021-12-26: qty 2
  Filled 2021-12-26 (×2): qty 1

## 2021-12-26 MED ORDER — ACETAMINOPHEN 325 MG PO TABS
650.0000 mg | ORAL_TABLET | Freq: Four times a day (QID) | ORAL | Status: DC | PRN
  Administered 2021-12-26: 650 mg via ORAL
  Filled 2021-12-26: qty 2

## 2021-12-26 MED ORDER — MAGNESIUM OXIDE -MG SUPPLEMENT 400 (240 MG) MG PO TABS
800.0000 mg | ORAL_TABLET | Freq: Once | ORAL | Status: AC
  Administered 2021-12-26: 800 mg via ORAL
  Filled 2021-12-26: qty 2

## 2021-12-26 MED ORDER — SODIUM CHLORIDE 0.45 % IV SOLN: INTRAVENOUS | Status: DC

## 2021-12-26 NOTE — H&P (Signed)
History and Physical    Patient: [name] OBS:[id number] DOB: 02/13/1946 DOA: 12/25/2021 DOS: the patient was seen and examined on 12/26/2021 PCP: Seward Carol, MD  Patient coming from: Home  Chief Complaint:  Chief Complaint  Patient presents with   Shortness of Breath   HPI: [name] is a 76 y.o. female with medical history significant of chronic nausea vomiting and diarrhea, and insulin mass, right ovarian fibroma, seasonal allergy, anemia renal disease, history of breast cancer, CAD, chronic diastolic heart failure, type 2 diabetes, overweight, hyperlipidemia, hypertension, SLE, lymphedema, history of pneumonia, history of DVT, history of renal vein thrombosis, small bowel obstruction, history of incidental CVA finding on MRI, vitamin B12 deficiency, vitamin D deficiency who was brought to the emergency department due to dyspnea associated with dizziness since Saturday.  The patient and family believe that she may have had too much furosemide becoming dehydrated.  She and her daughter stated that her blood pressure was very low, which is unusual for her.  No fever, chills or night sweats. No sore throat, rhinorrhea, dyspnea, wheezing or hemoptysis.  No chest pain, palpitations, diaphoresis, PND, orthopnea, there has been improvement on her pitting edema of the lower extremities.  No appetite changes, abdominal pain, diarrhea, constipation, melena or hematochezia.  No flank pain, dysuria, frequency or hematuria.  No polyuria, polydipsia, polyphagia or blurred vision.  ED course: Initial vital signs were temperature 98.4 F, pulse 89, respiration 13, BP 133/60 mmHg O2 sat 99% on room air.  The patient received 5000 mL normal saline bolus, magnesium sulfate 2 g IVPB and magnesium oxide 800 mg p.o.  Lab work: Her CBC showed a white count of 9.2, hemoglobin 11.7 g/dL platelets 259.  Repeat CBC this morning with a hemoglobin now 10.4 g/dL after hydration.  D-dimer was 3.47.   Troponin x2 and BNP normal.  BMP with a sodium 138, potassium 3.4, chloride 103 and CO2 23 mmol/L.  Glucose 104, BUN 67 creatinine 3.54 mg/dL.  Her creatinine level has risen to 3.67 mg/deciliter today.  3 days ago when discharged from the hospital creatinine level was 2.17 mg/dL.  Imaging: A 2 view chest radiograph shows no active cardiopulmonary disease.  Review of Systems: As mentioned in the history of present illness. All other systems reviewed and are negative. Past Medical History:  Diagnosis Date   Abdominal discomfort    Chronic N/V/D. Presumptive dx Crohn's dx per elevated p ANCA. Failed Entocort and Pentasa. Sep 2003 - ileocolectomy c anastomosis per Dr Deon Pilling 2/2 adhesions - path was hegative for Crohns. EGD, Sm bowel follow through (11/03), and an eteroclysis (10/03) were unrevealing. Cuases hypomag and hypocalcemia.   Adnexal mass 8/03   s/p lap BSO (R ovarian fibroma) & lysis of adhesions   Allergy    Seasonal   Anemia    Multifactorial. Baseline HgB 10-11 ish. B12 def - 150 in 3/10. Fe Def - ferritin 35 3/10. Both are being repleted.   Breast cancer ([place]) 03/16/13   right, 5 o'clock   CAD (coronary artery disease) 1996   1996 - PTCA and angioplasty diagonal branch. 2000 - Rotoblator & angiopllasty of diagonal. 2006 - subendocardial AMI, DES to proximal LAD.Marland Kitchen Also had 90% stenosis in distal apical LAD. EF 55 with apical hypokinesis. Indefinite ASA and Plavix.   CHF (congestive heart failure) (HCC)    Chronic kidney disease    Chronic renal insuff baseline Cr 1.2 - 1.4 ish.   Chronic kidney disease, stage III (moderate) (HCC)  Chronic renal insuff baseline Cr 1.2 - 1.4 ish. New baseline about 2 (2018)   Chronic pain    CT 10/10 = Spinal stenosis L2 - S1.   COVID-19 11/02/2020   Diabetes mellitus    Insulin dependent   Diabetes mellitus type 2 in obese ([place]) 05/03/2010   Managed on lantus and novolog. Has diabetic nephropathy. Metformin D/C'd 2012 2/2 creatinine.  No  diabetic retinopathy per 3/11.    Gout    Hx of radiation therapy 06/02/13- 07/16/13   right rbeast 4500 cGy 25 sessions, right breast boost 1600 cGy in 8 sessions   Hyperlipidemia    Managed with both a statin and Welchol. Welchol stopped 2014 2/2 cost and started on fenofibrate    Hypertension    2006 B renal arteries patent. 2003 MRA - no RAS. 2003 pheo W/U Dr Hassell Done reportedly negative.   Hypoxia 07/23/2017   Lupus ([place])    Lymphedema of breast    Personal history of radiation therapy 2015   Pneumonia of left lung due to infectious organism    RBBB    Renal vein thrombosis (HCC)    SBO (small bowel obstruction) ([place]) 09/17/2017   Secondary hyperparathyroidism ([place])    Stroke ([place])    Incidental finding MRI 2002 L lacunar infarct   Vitamin B12 deficiency    Vitamin D deficiency    Wears dentures    top   Past Surgical History:  Procedure Laterality Date   ABDOMINAL HYSTERECTOMY     BILATERAL SALPINGOOPHORECTOMY  8/03   Lap BSO (R ovarian fibroma) and adhesion lysis   BIOPSY  09/08/2020   Procedure: BIOPSY;  Surgeon: Ronald Lobo, MD;  Location: [facility];  Service: Endoscopy;;   BOWEL RESECTION  2003   ileocolectomy with anastomosis 2/2 adhesions   BREAST BIOPSY Right 2015   BREAST BIOPSY Right 07/2014   BREAST LUMPECTOMY Right 04/22/2013   BREAST LUMPECTOMY WITH NEEDLE LOCALIZATION AND AXILLARY SENTINEL LYMPH NODE BX Right 04/22/2013   Procedure: BREAST LUMPECTOMY WITH NEEDLE LOCALIZATION AND AXILLARY SENTINEL LYMPH NODE BX;  Surgeon: Stark Klein, MD;  Location: [place];  Service: General;  Laterality: Right;   CARDIAC CATHETERIZATION     2 stents   CHOLECYSTECTOMY     COLONOSCOPY     ESOPHAGOGASTRODUODENOSCOPY N/A 09/08/2020   Procedure: ESOPHAGOGASTRODUODENOSCOPY (EGD);  Surgeon: Ronald Lobo, MD;  Location: [facility];  Service: Endoscopy;  Laterality: N/A;   HEMICOLECTOMY     R sided hemicolectomy   HERNIA REPAIR     Ventral hernia repair    PTCA  4/06   Social History:  reports that she has never smoked. She has never used smokeless tobacco. She reports that she does not drink alcohol and does not use drugs.  Allergies  Allergen Reactions   Percocet [Oxycodone-Acetaminophen] Other (See Comments)    headaches   Haloperidol Lactate Nausea And Vomiting   Lorazepam Nausea Only   Morphine Sulfate Other (See Comments)    Agitation   Propoxyphene Hcl Nausea And Vomiting   Tramadol Hcl Nausea And Vomiting   Valium [Diazepam] Other (See Comments)    Agitation    Anesthetics, Halogenated Other (See Comments)    Per patient's daughter, she can not tolerate anesthetics.    Etomidate Other (See Comments)    Heart stopped during anesthesia   Fentanyl Other (See Comments)    Heart stopped during anesthesia    Haloperidol Nausea And Vomiting   Morphine Sulfate Nausea Only   Propoxyphene Nausea  And Vomiting   Tramadol Hcl Nausea And Vomiting   Versed [Midazolam] Other (See Comments)    Heart stopped during anesthesia    Family History  Problem Relation Age of Onset   Pancreatic cancer Brother 39   Breast cancer Mother 40   Lung cancer Maternal Aunt    Breast cancer Maternal Aunt    Prostate cancer Maternal Uncle    Heart attack Maternal Grandmother    Breast cancer Maternal Grandmother    Colon cancer Maternal Grandmother    Clotting disorder Maternal Grandmother    Diabetes Maternal Grandmother    Kidney failure Maternal Aunt    Hypertension Daughter     Prior to Admission medications   Medication Sig Start Date End Date Taking? Authorizing Provider  acetaminophen (TYLENOL) 325 MG tablet Take 2 tablets (650 mg total) by mouth every 6 (six) hours as needed for mild pain (or Fever >/= 101). 11/08/21   Elgergawy, Silver Huguenin, MD  amLODipine (NORVASC) 10 MG tablet Take 1/2 tablets (5 mg total) by mouth daily. 10/31/21 11/30/21  Arrien, Jimmy Picket, MD  apixaban (ELIQUIS) 2.5 MG TABS tablet Take 1 tablet (2.5 mg total) by  mouth 2 (two) times daily. 10/30/21   Arrien, Jimmy Picket, MD  budesonide (ENTOCORT EC) 3 MG 24 hr capsule Take 1 capsule by mouth once daily Patient taking differently: Take 3 mg by mouth daily. 11/30/20   Gatha Mayer, MD  ergocalciferol (VITAMIN D2) 1.25 MG (50000 UT) capsule Take 1 capsule (50,000 Units total) by mouth once a week. 08/02/20   Truitt Merle, MD  famotidine (PEPCID) 20 MG tablet Take 10 mg by mouth at bedtime. 10/14/21   [provider]  FLUoxetine (PROZAC) 20 MG capsule Take 20 mg by mouth daily. 10/12/21   [provider]  furosemide (LASIX) 40 MG tablet Take 1 tablet (40 mg total) by mouth daily. 11/26/21 12/26/21  Lincoln Brigham, PA-C  lipase/protease/amylase (CREON) 36000 UNITS CPEP capsule Take 36,000 Units by mouth 3 (three) times daily with meals.    [provider]  loperamide (IMODIUM A-D) 2 MG tablet Take 4 mg by mouth 4 (four) times daily as needed for diarrhea or loose stools.    [provider]  mirtazapine (REMERON) 7.5 MG tablet Take 1 tablet (7.5 mg total) by mouth at bedtime. 12/22/20   Truitt Merle, MD  nitroGLYCERIN (NITROSTAT) 0.4 MG SL tablet DISSOLVE 1 TABLET UNDER THE TONGUE EVERY 5 MINUTES AS  NEEDED FOR CHEST PAIN. MAX  OF 3 TABLETS IN 15 MINUTES. CALL 911 IF PAIN PERSISTS. Patient taking differently: Place 0.4 mg under the tongue every 5 (five) minutes as needed for chest pain. 10/04/20   Jerline Pain, MD  ondansetron (ZOFRAN-ODT) 4 MG disintegrating tablet Take 1 tablet (4 mg total) by mouth every 8 (eight) hours as needed for nausea or vomiting. 12/23/21   [name], MD  pantoprazole (PROTONIX) 40 MG tablet Take 1 tablet (40 mg total) by mouth 2 (two) times daily. 12/23/21   [name], MD  potassium chloride (KLOR-CON) 20 MEQ packet Take 1 tablet (20 mEq) by mouth daily. 10/31/21 11/30/21  Tawni Millers, MD    Physical Exam: Vitals:   12/26/21 0515 12/26/21 0545 12/26/21 0600 12/26/21 0854  BP: (!)  149/58 (!) 159/65 (!) 158/64 (!) 161/56  Pulse: 82  82 78  Resp: 16 15 17 14   Temp:    98 F (36.7 C)  TempSrc:    Oral  SpO2: 100%  100% 99%   Physical Exam Vitals and nursing note reviewed.  Constitutional:      General: She is awake. She is not in acute distress.    Appearance: She is well-developed and overweight.  HENT:     Head: Normocephalic.     Nose: No rhinorrhea.     Mouth/Throat:     Mouth: Mucous membranes are dry.  Eyes:     General: No scleral icterus.    Pupils: Pupils are equal, round, and reactive to light.  Neck:     Vascular: No JVD.  Cardiovascular:     Rate and Rhythm: Normal rate and regular rhythm.  Pulmonary:     Effort: Pulmonary effort is normal.     Breath sounds: No decreased breath sounds, wheezing, rhonchi or rales.  Abdominal:     General: Bowel sounds are normal. There is no distension.     Palpations: Abdomen is soft.     Tenderness: There is no abdominal tenderness.  Musculoskeletal:     Cervical back: Neck supple.     Left knee: Swelling present. Decreased range of motion. Tenderness present.     Right lower leg: No edema.     Left lower leg: No edema.  Skin:    General: Skin is warm and dry.  Neurological:     General: No focal deficit present.     Mental Status: She is alert and oriented to person, place, and time.  Psychiatric:        Mood and Affect: Mood normal.        Behavior: Behavior normal. Behavior is cooperative.    Data Reviewed:  There are no new results to review at this time.  10/24/2021 echocardiogram IMPRESSIONS     1. Left ventricular ejection fraction, by estimation, is 60 to 65%. The  left ventricle has normal function. The left ventricle has no regional  wall motion abnormalities. Left ventricular diastolic parameters are  indeterminate.   2. Right ventricular systolic function is normal. The right ventricular  size is normal. There is moderately elevated pulmonary artery systolic  pressure.   3.  Left atrial size was mildly dilated.   4. The mitral valve is normal in structure. Mild to moderate mitral valve  regurgitation.   5. Tricuspid valve regurgitation is mild to moderate.   6. The aortic valve is tricuspid. Aortic valve regurgitation is not  visualized. No aortic stenosis is present.   7. The inferior vena cava is dilated in size with <50% respiratory  variability, suggesting right atrial pressure of 15 mmHg.   Assessment and Plan: Principal Problem:   AKI (acute kidney injury) ([place]) MedSurg/inpatient. Continue IV fluids. Hold diuretic. Avoid hypotension. Avoid nephrotoxins. Monitor intake and output. Monitor renal function electrolytes.  Active Problems:   Hypomagnesemia Replacement given.   Follow level as needed.    Left knee pain No fractures on the x-ray. Analgesics as needed. PT evaluation.    Benign essential HTN Continue amlodipine 10 mg p.o. daily. Monitor blood pressure and heart rate.    Coronary artery disease involving  native coronary artery of native heart On apixaban and amlodipine. Nitroglycerin as needed Follow-up with cardiology as an outpatient.    Anemia in chronic kidney disease Monitor hematocrit and hemoglobin. Follow-up with Dr. Hollie Salk as scheduled. Erythropoietin per nephrology as needed.    Depression Continue fluoxetine 20 mg p.o. daily. Continue mirtazapine 7.5 mg p.o. bedtime.    Chronic diastolic CHF (congestive heart failure) (HCC) No signs of decompensation.  Patient is volume depleted. Hold furosemide for now.    GERD without esophagitis Continue famotidine 10 mg p.o. daily.  Continue pantoprazole 40 mg p.o. twice a day    Gout Currently not on medical therapy.    Type 2 diabetes mellitus with hyperlipidemia (HCC) Currently on diet only. Continue carbohydrate modified diet. CBG monitoring before meals and bedtime.    History of DVT (deep vein thrombosis) On apixaban 2.5 mg p.o. twice daily. `     Advance Care Planning:   Code Status: Full Code   Consults:   Family Communication:   Severity of Illness: The appropriate patient status for this patient is INPATIENT. Inpatient status is judged to be reasonable and necessary in order to provide the required intensity of service to ensure the patient's safety. The patient's presenting symptoms, physical exam findings, and initial radiographic and laboratory data in the context of their chronic comorbidities is felt to place them at high risk for further clinical deterioration. Furthermore, it is not anticipated that the patient will be medically stable for discharge from the hospital within 2 midnights of admission.   * I certify that at the point of admission it is my clinical judgment that the patient will require inpatient hospital care spanning beyond 2 midnights from the point of admission due to high intensity of service, high risk for further deterioration and high frequency of surveillance required.*  Author: Reubin Milan, MD 12/26/2021 10:46 AM  For on call review [email or web address].   This document was prepared using Dragon voice recognition software and may contain some unintended transcription errors.

## 2021-12-26 NOTE — ED Notes (Signed)
[facility] called for patient transfer to [facility]

## 2021-12-26 NOTE — ED Notes (Signed)
[facility] at bedside 

## 2021-12-26 NOTE — ED Notes (Signed)
Assisted patient from bed to bedside toilet and back to bed.  Patient unstable on her feet and unable to ambulate without assistance.  Patient too unstable to walk at this time.

## 2021-12-26 NOTE — ED Provider Notes (Signed)
Patient has been boarding in the ED overnight for orthostatic hypotension and AKI thought to be related to overdiuresis. Her BP's have stabilized. Will recheck labs. Will restart appropriate home medications holding antihypertensives and Lasix for the time being. It does appear that a D dimer was significantly elevated as it was in the past. As it appears her CC was Dyspnea and she hadn't taken her eliquis in awhile I can only assume this was to rule out PE so will still likely need a VQ scan or some other rule out test requiring transfer to a different facility. This can be done later in the morning if she still doesn't have a bed ready.    Deshaun Weisinger, Corene Cornea, MD 12/26/21 [phone]

## 2021-12-26 NOTE — Plan of Care (Signed)
  Problem: Activity: Goal: Risk for activity intolerance will decrease Outcome: Progressing   Problem: Nutrition: Goal: Adequate nutrition will be maintained Outcome: Progressing   Problem: Elimination: Goal: Will not experience complications related to bowel motility Outcome: Progressing Goal: Will not experience complications related to urinary retention Outcome: Progressing   Problem: Safety: Goal: Ability to remain free from injury will improve Outcome: Progressing   Problem: Skin Integrity: Goal: Risk for impaired skin integrity will decrease Outcome: Progressing

## 2021-12-26 NOTE — ED Notes (Signed)
[facility] called 10 min for arrival

## 2021-12-27 ENCOUNTER — Inpatient Hospital Stay (HOSPITAL_COMMUNITY): Payer: Medicare HMO

## 2021-12-27 DIAGNOSIS — M25562 Pain in left knee: Secondary | ICD-10-CM

## 2021-12-27 DIAGNOSIS — M7989 Other specified soft tissue disorders: Secondary | ICD-10-CM

## 2021-12-27 DIAGNOSIS — M79605 Pain in left leg: Secondary | ICD-10-CM

## 2021-12-27 DIAGNOSIS — N184 Chronic kidney disease, stage 4 (severe): Secondary | ICD-10-CM

## 2021-12-27 LAB — COMPREHENSIVE METABOLIC PANEL
ALT: 13 U/L (ref 0–44)
AST: 14 U/L — ABNORMAL LOW (ref 15–41)
Albumin: 2.8 g/dL — ABNORMAL LOW (ref 3.5–5.0)
Alkaline Phosphatase: 74 U/L (ref 38–126)
Anion gap: 8 (ref 5–15)
BUN: 66 mg/dL — ABNORMAL HIGH (ref 8–23)
CO2: 21 mmol/L — ABNORMAL LOW (ref 22–32)
Calcium: 8.6 mg/dL — ABNORMAL LOW (ref 8.9–10.3)
Chloride: 110 mmol/L (ref 98–111)
Creatinine, Ser: 2.63 mg/dL — ABNORMAL HIGH (ref 0.44–1.00)
GFR, Estimated: 19 mL/min — ABNORMAL LOW (ref >60)
Glucose, Bld: 90 mg/dL (ref 70–99)
Potassium: 4.2 mmol/L (ref 3.5–5.1)
Sodium: 139 mmol/L (ref 135–145)
Total Bilirubin: 0.6 mg/dL (ref 0.3–1.2)
Total Protein: 6.4 g/dL — ABNORMAL LOW (ref 6.5–8.1)

## 2021-12-27 LAB — CBC
HCT: 33.5 % — ABNORMAL LOW (ref 36.0–46.0)
Hemoglobin: 10.3 g/dL — ABNORMAL LOW (ref 12.0–15.0)
MCH: 28.1 pg (ref 26.0–34.0)
MCHC: 30.7 g/dL (ref 30.0–36.0)
MCV: 91.5 fL (ref 80.0–100.0)
Platelets: 209 10*3/uL (ref 150–400)
RBC: 3.66 MIL/uL — ABNORMAL LOW (ref 3.87–5.11)
RDW: 16 % — ABNORMAL HIGH (ref 11.5–15.5)
WBC: 10.1 10*3/uL (ref 4.0–10.5)
nRBC: 0 % (ref 0.0–0.2)

## 2021-12-27 LAB — GLUCOSE, CAPILLARY
Glucose-Capillary: 91 mg/dL (ref 70–99)
Glucose-Capillary: 108 mg/dL — ABNORMAL HIGH (ref 70–99)
Glucose-Capillary: 83 mg/dL (ref 70–99)
Glucose-Capillary: 122 mg/dL — ABNORMAL HIGH (ref 70–99)

## 2021-12-27 NOTE — Hospital Course (Addendum)
76 year old complicated past medical history presented with dizziness and shortness of breath, family felt patient may have been over diuresed from Lasix.  Blood pressure lower than normal.  Admitted for acute kidney injury.

## 2021-12-27 NOTE — Progress Notes (Signed)
LLE venous duplex has been completed.   Results can be found under chart review under CV PROC. 12/27/2021 4:39 PM Jun Osment RVT, RDMS

## 2021-12-27 NOTE — Progress Notes (Addendum)
  Progress Note   Patient: [name] FMB:[id number] DOB: 05/14/1945 DOA: 12/25/2021     1 DOS: the patient was seen and examined on 12/27/2021   Brief hospital course: 76 year old complicated past medical history presented with dizziness and shortness of breath, family felt patient may have been over diuresed from Lasix.  Blood pressure lower than normal.  Admitted for acute kidney injury.  Assessment and Plan: AKI ([place]) with baseline ~2.4, CKD stage IV -- Secondary to overdiuresis.  Appears to be close to baseline today with hydration. --hold diuretic. Close follow-up with PCP or Dr. Hollie Salk   Hypomagnesemia --Repleted   Left knee pain --Chronic degenerative changes seen on x-ray.  Benign essential HTN --Stable   Coronary artery disease involving  native coronary artery of native heart --Continue    Anemia in chronic kidney disease --Mild, stable, follow-up as an outpatient.   Depression --Continue fluoxetine, mirtazapine     Chronic diastolic CHF (congestive heart failure) (HCC) --Appears dry.   GERD without esophagitis --Continue famotidine, pantoprazole     Type 2 diabetes mellitus with hyperlipidemia (HCC) -- Diet controlled, stable   History of DVT (deep vein thrombosis) --On apixaban 2.5 mg p.o. twice daily. --Of note patient has chronically elevated D-dimer seen in April and August last year  Overall improved, will continue IVF, check BMP in AM, if stable/improved, will plan discharge home     Subjective:  Feels better Has intermittent dizziness for months, none currently Ambulating without difficulty (no assist devices at home) Chronic SOB Multiple family members at home  Physical Exam: Vitals:   12/27/21 0152 12/27/21 0533 12/27/21 0900 12/27/21 1350  BP: (!) 178/70 (!) 152/67 (!) 172/73 (!) 164/72  Pulse: 78 73 78 75  Resp: 18 18 18 18   Temp: 98.5 F (36.9 C) 98.3 F (36.8 C) 98.3 F (36.8 C) 98.1 F (36.7 C)  TempSrc: Oral Oral Oral  Oral  SpO2: 97% 96% 100% 100%  Weight:      Height:       Physical Exam Vitals reviewed.  Constitutional:      General: She is not in acute distress.    Appearance: She is not ill-appearing or toxic-appearing.  Cardiovascular:     Rate and Rhythm: Normal rate and regular rhythm.     Heart sounds: No murmur heard. Pulmonary:     Effort: Pulmonary effort is normal. No respiratory distress.     Breath sounds: No wheezing, rhonchi or rales.  Neurological:     Mental Status: She is alert.  Psychiatric:        Mood and Affect: Mood normal.        Behavior: Behavior normal.     Data Reviewed:  CBG stable BUN 66 no change Creatinine down to 2.63 Hgb stable 10.3 Ddimer chronic elevation  Family Communication: none  Disposition: Status is: Inpatient Remains inpatient appropriate because: AKI  Planned Discharge Destination: Home    Time spent: 35 minutes  Author: Murray Hodgkins, MD 12/27/2021 7:02 PM  For on call review [email or web address].

## 2021-12-27 NOTE — Evaluation (Signed)
Physical Therapy Evaluation Patient Details Name: [name] MRN: [id number] DOB: 12-Aug-1945 Today's Date: 12/27/2021  History of Present Illness  76 y.o. female with medical history significant of chronic nausea vomiting and diarrhea, and insulin mass, right ovarian fibroma, seasonal allergy, anemia renal disease, history of breast cancer, CAD, chronic diastolic heart failure, type 2 diabetes, overweight, hyperlipidemia, hypertension, SLE, lymphedema, history of pneumonia, history of DVT, history of renal vein thrombosis, small bowel obstruction, history of incidental CVA finding on MRI, vitamin B12 deficiency, vitamin D deficiency who was brought to the emergency department due to dyspnea associated with dizziness. Dx of AKI, hypomagnesemia.  Clinical Impression  Pt admitted with above diagnosis. Pt ambulated 3' with RW, no loss of balance, distance limited by 9/10 L knee pain. At baseline, pt ambulates without an assistive device, she denies falls in past 6 months.  Pt currently with functional limitations due to the deficits listed below (see PT Problem List). Pt will benefit from skilled PT to increase their independence and safety with mobility to allow discharge to the venue listed below.          Recommendations for follow up therapy are one component of a multi-disciplinary discharge planning process, led by the attending physician.  Recommendations may be updated based on patient status, additional functional criteria and insurance authorization.  Follow Up Recommendations No PT follow up      Assistance Recommended at Discharge Set up Supervision/Assistance  Patient can return home with the following  A little help with bathing/dressing/bathroom;Assistance with cooking/housework;Help with stairs or ramp for entrance    Equipment Recommendations None recommended by PT  Recommendations for Other Services       Functional Status Assessment Patient has had a recent decline  in their functional status and demonstrates the ability to make significant improvements in function in a reasonable and predictable amount of time.     Precautions / Restrictions Precautions Precautions: Fall Precaution Comments: pt denies falls in past 6 months Restrictions Weight Bearing Restrictions: No      Mobility  Bed Mobility               General bed mobility comments: up in recliner    Transfers Overall transfer level: Modified independent Equipment used: Rolling walker (2 wheels) Transfers: Sit to/from Stand Sit to Stand: Modified independent (Device/Increase time)                Ambulation/Gait Ambulation/Gait assistance: Supervision Gait Distance (Feet): 50 Feet Assistive device: Rolling walker (2 wheels) Gait Pattern/deviations: Step-through pattern, Decreased stride length, Decreased weight shift to left, Antalgic Gait velocity: decr     General Gait Details: steady, no loss of balance, distance limited by L knee pain  Stairs            Wheelchair Mobility    Modified Rankin (Stroke Patients Only)       Balance Overall balance assessment: Modified Independent                                           Pertinent Vitals/Pain Pain Assessment Pain Assessment: 0-10 Pain Score: 9  Pain Location: L knee Pain Descriptors / Indicators: Sore Pain Intervention(s): Monitored during session, RN gave pain meds during session, Limited activity within patient's tolerance    Home Living Family/patient expects to be discharged to:: Private residence Living Arrangements: Children Available Help at Discharge: Family;Available  PRN/intermittently Type of Home: House Home Access: Level entry     Alternate Level Stairs-Number of Steps: 12 Home Layout: Two level;1/2 bath on main level Home Equipment: Rolling Walker (2 wheels);Rollator (4 wheels);Cane - single point Additional Comments: lives with children who work during the  day    Prior Function Prior Level of Function : Independent/Modified Independent             Mobility Comments: ind without AD, denies falls in 6 months ADLs Comments: daughter assists with med mgmt and transportation     Hand Dominance        Extremity/Trunk Assessment   Upper Extremity Assessment Upper Extremity Assessment: Overall WFL for tasks assessed    Lower Extremity Assessment Lower Extremity Assessment: LLE deficits/detail LLE Deficits / Details: knee ext -3/5 limited by pain LLE Sensation: WNL LLE Coordination: WNL    Cervical / Trunk Assessment Cervical / Trunk Assessment: Normal  Communication   Communication: No difficulties  Cognition Arousal/Alertness: Awake/alert Behavior During Therapy: WFL for tasks assessed/performed Overall Cognitive Status: Within Functional Limits for tasks assessed                                          General Comments      Exercises     Assessment/Plan    PT Assessment Patient needs continued PT services  PT Problem List Decreased activity tolerance;Decreased balance;Decreased mobility;Pain       PT Treatment Interventions Gait training;Therapeutic exercise;Functional mobility training;DME instruction;Therapeutic activities    PT Goals (Current goals can be found in the Care Plan section)  Acute Rehab PT Goals Patient Stated Goal: decr knee pain PT Goal Formulation: With patient Time For Goal Achievement: 01/10/22 Potential to Achieve Goals: Good    Frequency Min 3X/week     Co-evaluation               AM-PAC PT "6 Clicks" Mobility  Outcome Measure Help needed turning from your back to your side while in a flat bed without using bedrails?: None Help needed moving from lying on your back to sitting on the side of a flat bed without using bedrails?: None Help needed moving to and from a bed to a chair (including a wheelchair)?: None Help needed standing up from a chair using your  arms (e.g., wheelchair or bedside chair)?: None Help needed to walk in hospital room?: A Little Help needed climbing 3-5 steps with a railing? : A Little 6 Click Score: 22    End of Session Equipment Utilized During Treatment: Gait belt Activity Tolerance: Patient tolerated treatment well Patient left: in chair;with call bell/phone within reach Nurse Communication: Mobility status PT Visit Diagnosis: Difficulty in walking, not elsewhere classified (R26.2);Pain Pain - Right/Left: Left Pain - part of body: Knee    Time: 9798-9211 PT Time Calculation (min) (ACUTE ONLY): 13 min   Charges:   PT Evaluation $PT Eval Moderate Complexity: 1 Mod         Philomena Doheny PT 12/27/2021  Acute Rehabilitation Services  Office [phone]

## 2021-12-27 NOTE — Progress Notes (Signed)
  Transition of Care [facility]) Screening Note   Patient Details  Name: [name] Date of Birth: 02/26/1946   Transition of Care [facility]) CM/SW Contact:    Lennart Pall, LCSW Phone Number: 12/27/2021, 2:10 PM    Transition of Care Department [facility]) has reviewed patient and no TOC needs have been identified at this time. We will continue to monitor patient advancement through interdisciplinary progression rounds. If new patient transition needs arise, please place a TOC consult.

## 2021-12-28 ENCOUNTER — Telehealth: Payer: Self-pay

## 2021-12-28 DIAGNOSIS — M25562 Pain in left knee: Secondary | ICD-10-CM | POA: Diagnosis not present

## 2021-12-28 DIAGNOSIS — I5032 Chronic diastolic (congestive) heart failure: Secondary | ICD-10-CM

## 2021-12-28 DIAGNOSIS — Z86718 Personal history of other venous thrombosis and embolism: Secondary | ICD-10-CM

## 2021-12-28 DIAGNOSIS — E785 Hyperlipidemia, unspecified: Secondary | ICD-10-CM

## 2021-12-28 DIAGNOSIS — E1169 Type 2 diabetes mellitus with other specified complication: Secondary | ICD-10-CM

## 2021-12-28 DIAGNOSIS — G8929 Other chronic pain: Secondary | ICD-10-CM

## 2021-12-28 DIAGNOSIS — N179 Acute kidney failure, unspecified: Secondary | ICD-10-CM | POA: Diagnosis not present

## 2021-12-28 LAB — GLUCOSE, CAPILLARY
Glucose-Capillary: 138 mg/dL — ABNORMAL HIGH (ref 70–99)
Glucose-Capillary: 155 mg/dL — ABNORMAL HIGH (ref 70–99)
Glucose-Capillary: 80 mg/dL (ref 70–99)
Glucose-Capillary: 100 mg/dL — ABNORMAL HIGH (ref 70–99)

## 2021-12-28 LAB — BASIC METABOLIC PANEL
Anion gap: 8 (ref 5–15)
BUN: 58 mg/dL — ABNORMAL HIGH (ref 8–23)
CO2: 20 mmol/L — ABNORMAL LOW (ref 22–32)
Calcium: 9 mg/dL (ref 8.9–10.3)
Chloride: 112 mmol/L — ABNORMAL HIGH (ref 98–111)
Creatinine, Ser: 2.32 mg/dL — ABNORMAL HIGH (ref 0.44–1.00)
GFR, Estimated: 22 mL/min — ABNORMAL LOW (ref >60)
Glucose, Bld: 87 mg/dL (ref 70–99)
Potassium: 4.3 mmol/L (ref 3.5–5.1)
Sodium: 140 mmol/L (ref 135–145)

## 2021-12-28 MED ORDER — HYDRALAZINE HCL 20 MG/ML IJ SOLN
10.0000 mg | Freq: Once | INTRAMUSCULAR | Status: AC
  Administered 2021-12-28: 10 mg via INTRAVENOUS
  Filled 2021-12-28: qty 1

## 2021-12-28 NOTE — Telephone Encounter (Signed)
     Patient  visit on 10/7  at [facility] you been able to follow up with your primary care physician? na  The patient was or was not able to obtain any needed medicine or equipment.na  Are there diet recommendations that you are having difficulty following?na  Patient expresses understanding of discharge instructions and education provided has no other needs at this time. yes     [facility], Care Management  [phone] [address], [place],  [address] Phone: [phone] Email: [email or web address]

## 2021-12-28 NOTE — Discharge Summary (Signed)
Physician Discharge Summary   Patient: [name] MRN: [id number] DOB: 1945/09/07  Admit date:     12/25/2021  Discharge date: 12/28/21  Discharge Physician: Murray Hodgkins MD   PCP: Seward Carol, MD   Recommendations at discharge:   AKI [facility]) with baseline ~2.4, CKD stage IV --See below  Consider discontinuation of amlodipine for LE edema    Discharge Diagnoses: Principal Problem:   AKI (acute kidney injury) ([place]) Active Problems:   Benign essential HTN   Coronary artery disease involving native coronary artery of native heart   Anemia in chronic kidney disease   Depression   Chronic diastolic CHF (congestive heart failure) ([place])   GERD without esophagitis   Gout   Type 2 diabetes mellitus with hyperlipidemia (HCC)   History of DVT (deep vein thrombosis)   Hypomagnesemia   Left knee pain   Hospital Course: 76 year old complicated past medical history presented with dizziness and shortness of breath, family felt patient may have been over diuresed from Lasix.  Blood pressure lower than normal.  Admitted for acute kidney injury.  AKI ([place]) with baseline ~2.4, CKD stage IV --Probably a combination of poor fluid intake more so than overdiuresis.  Patient mostly just drinks Pepsi.  Encouraged hydration. --Can continue as needed diuretic, but I did discuss with her daughter who is a Designer, jewellery within the system suspicion for lower extremity edema from amlodipine.  Close follow-up with PCP.  We will also send a message to Dr. Hollie Salk apprising her of the hospitalization.  [name] will also touch base with her.   Hypomagnesemia --Repleted   Left knee pain --Chronic degenerative changes seen on x-ray.   Benign essential HTN --Stable   Coronary artery disease involving  native coronary artery of native heart --Continue    Anemia in chronic kidney disease --Mild, stable, follow-up as an outpatient.   Depression --Continue fluoxetine,  mirtazapine     Chronic diastolic CHF (congestive heart failure) (HCC) --stable --Suspect BLE edema L>R secondary to amlodipine   GERD without esophagitis --Continue famotidine, pantoprazole     Type 2 diabetes mellitus with hyperlipidemia (HCC) -- Diet controlled, stable   History of DVT (deep vein thrombosis) --On apixaban 2.5 mg p.o. twice daily. --Of note patient has chronically elevated D-dimer seen in April and August last year. LLE venous duplex negative. No further evaluation suggested      Consultants:  None  Procedures performed:  None   Disposition: Home Diet recommendation:  Cardiac and Carb modified diet DISCHARGE MEDICATION: Allergies as of 12/28/2021       Reactions   Percocet [oxycodone-acetaminophen] Other (See Comments)   headaches   Haloperidol Lactate Nausea And Vomiting   Lorazepam Nausea Only   Morphine Sulfate Other (See Comments)   Agitation   Propoxyphene Hcl Nausea And Vomiting   Tramadol Hcl Nausea And Vomiting   Valium [diazepam] Other (See Comments)   Agitation   Anesthetics, Halogenated Other (See Comments)   Per patient's daughter, she can not tolerate anesthetics.    Etomidate Other (See Comments)   Heart stopped during anesthesia   Fentanyl Other (See Comments)   Heart stopped during anesthesia   Haloperidol Nausea And Vomiting   Morphine Sulfate Nausea Only   Propoxyphene Nausea And Vomiting   Tramadol Hcl Nausea And Vomiting   Versed [midazolam] Other (See Comments)   Heart stopped during anesthesia        Medication List     TAKE these medications    acetaminophen 325  MG tablet Commonly known as: TYLENOL Take 2 tablets (650 mg total) by mouth every 6 (six) hours as needed for mild pain (or Fever >/= 101). What changed: reasons to take this   amLODipine 10 MG tablet Commonly known as: NORVASC Take 1/2 tablets (5 mg total) by mouth daily.   apixaban 2.5 MG Tabs tablet Commonly known as: Eliquis Take 1 tablet  (2.5 mg total) by mouth 2 (two) times daily.   budesonide 3 MG 24 hr capsule Commonly known as: ENTOCORT EC Take 1 capsule by mouth once daily   ergocalciferol 1.25 MG (50000 UT) capsule Commonly known as: VITAMIN D2 Take 1 capsule (50,000 Units total) by mouth once a week.   famotidine 20 MG tablet Commonly known as: PEPCID Take 10 mg by mouth at bedtime.   FLUoxetine 20 MG capsule Commonly known as: PROZAC Take 20 mg by mouth daily.   furosemide 40 MG tablet Commonly known as: Lasix Take 1 tablet (40 mg total) by mouth daily. What changed:  when to take this reasons to take this   lipase/protease/amylase 36000 UNITS Cpep capsule Commonly known as: CREON Take 36,000 Units by mouth 3 (three) times daily with meals.   loperamide 2 MG tablet Commonly known as: IMODIUM A-D Take 4 mg by mouth 4 (four) times daily as needed for diarrhea or loose stools.   mirtazapine 7.5 MG tablet Commonly known as: REMERON Take 1 tablet (7.5 mg total) by mouth at bedtime.   nitroGLYCERIN 0.4 MG SL tablet Commonly known as: NITROSTAT DISSOLVE 1 TABLET UNDER THE TONGUE EVERY 5 MINUTES AS  NEEDED FOR CHEST PAIN. MAX  OF 3 TABLETS IN 15 MINUTES. CALL 911 IF PAIN PERSISTS. What changed: See the new instructions.   ondansetron 4 MG disintegrating tablet Commonly known as: ZOFRAN-ODT Take 1 tablet (4 mg total) by mouth every 8 (eight) hours as needed for nausea or vomiting.   pantoprazole 40 MG tablet Commonly known as: PROTONIX Take 1 tablet (40 mg total) by mouth 2 (two) times daily.   potassium chloride 20 MEQ packet Commonly known as: KLOR-CON Take 1 tablet (20 mEq) by mouth daily.        Follow-up Information     Seward Carol, MD. Schedule an appointment as soon as possible for a visit in 1 week.   Specialty: Internal Medicine Why: For follow-up Contact information: [address] [place] [address] [phone]         [place]  [facility]. Go to .   Specialty: Emergency Medicine Why: As needed, If symptoms worsen Contact information: [place] [id number] [phone]               Feels better A little dizziness this AM, but now resolved Ambulating in room without assistance  Discharge Exam: Filed Weights   12/26/21 1300  Weight: 68.8 kg   Physical Exam Vitals reviewed.  Constitutional:      General: She is not in acute distress.    Appearance: She is not ill-appearing or toxic-appearing.  Cardiovascular:     Rate and Rhythm: Normal rate and regular rhythm.     Heart sounds: No murmur heard. Pulmonary:     Effort: Pulmonary effort is normal. No respiratory distress.     Breath sounds: No wheezing, rhonchi or rales.  Musculoskeletal:     Right lower leg: Edema (minimal) present.     Left lower leg: Edema present.  Neurological:  Mental Status: She is alert.  Psychiatric:        Mood and Affect: Mood normal.        Behavior: Behavior normal.    UOP 1150 CBG stable Creatinine 3.67 > 2.63 > 2.32  Condition at discharge: good  The results of significant diagnostics from this hospitalization (including imaging, microbiology, ancillary and laboratory) are listed below for reference.   Imaging Studies: VAS [place] LOWER EXTREMITY VENOUS (DVT)  Result Date: 12/27/2021  Lower Venous DVT Study Patient Name:  [name]  Date of Exam:   12/27/2021 Medical Rec #: [id number]           Accession #:    [id number] Date of Birth: 14-Sep-1945           Patient Gender: F Patient Age:   4 years Exam Location:  [facility] Procedure:      VAS [place] LOWER EXTREMITY VENOUS (DVT) Referring Phys: Murray Hodgkins --------------------------------------------------------------------------------  Indications: Pain, and Swelling.  Risk Factors: HX of DVT. Anticoagulation: Eliquis. Comparison Study: Previous exam on 10/19/21 was negative for DVT Performing  Technologist: Rogelia Rohrer RVT, RDMS  Examination Guidelines: A complete evaluation includes B-mode imaging, spectral Doppler, color Doppler, and power Doppler as needed of all accessible portions of each vessel. Bilateral testing is considered an integral part of a complete examination. Limited examinations for reoccurring indications may be performed as noted. The reflux portion of the exam is performed with the patient in reverse Trendelenburg.  +-----+---------------+---------+-----------+----------+--------------+ RIGHTCompressibilityPhasicitySpontaneityPropertiesThrombus Aging +-----+---------------+---------+-----------+----------+--------------+ CFV  Full           Yes      Yes                                 +-----+---------------+---------+-----------+----------+--------------+   +---------+---------------+---------+-----------+----------+--------------+ LEFT     CompressibilityPhasicitySpontaneityPropertiesThrombus Aging +---------+---------------+---------+-----------+----------+--------------+ CFV      Full           Yes      Yes                                 +---------+---------------+---------+-----------+----------+--------------+ SFJ      Full                                                        +---------+---------------+---------+-----------+----------+--------------+ FV Prox  Full           Yes      Yes                                 +---------+---------------+---------+-----------+----------+--------------+ FV Mid   Full           Yes      Yes                                 +---------+---------------+---------+-----------+----------+--------------+ FV DistalFull           Yes      Yes                                 +---------+---------------+---------+-----------+----------+--------------+  PFV      Full                                                         +---------+---------------+---------+-----------+----------+--------------+ POP      Full           Yes      Yes                                 +---------+---------------+---------+-----------+----------+--------------+ PTV      Full                                                        +---------+---------------+---------+-----------+----------+--------------+ PERO     Full                                                        +---------+---------------+---------+-----------+----------+--------------+    Summary: RIGHT: - No evidence of common femoral vein obstruction.  LEFT: - There is no evidence of deep vein thrombosis in the lower extremity.  - No cystic structure found in the popliteal fossa.  *See table(s) above for measurements and observations. Electronically signed by Harold Barban MD on 12/27/2021 at 9:32:03 PM.    Final    DG Knee 3 Views Left  Result Date: 12/26/2021 CLINICAL DATA:  Left knee pain EXAM: LEFT KNEE - 3 VIEW COMPARISON:  Left knee 05/17/2011 FINDINGS: Mild lateral subluxation of the tibia unchanged. Chondrocalcinosis in the medial and lateral meniscus. This has progressed in the interval. Mild joint space narrowing medially has also progressed. Negative for fracture.  Moderate joint effusion. Atherosclerotic calcification popliteal artery. IMPRESSION: Progression of degenerative change and chondrocalcinosis. Moderate joint effusion. No fracture. Electronically Signed   By: Franchot Gallo M.D.   On: 12/26/2021 13:48   DG Chest 2 View  Result Date: 12/25/2021 CLINICAL DATA:  Shortness of breath and dizziness EXAM: CHEST - 2 VIEW COMPARISON:  Chest radiograph 12/23/2021 FINDINGS: Stable cardiac and mediastinal contours. No large area pulmonary consolidation. No pleural effusion or pneumothorax. Right axillary surgical clips. Right upper quadrant surgical clips. Thoracic spine degenerative changes. IMPRESSION: No active cardiopulmonary disease. Electronically  Signed   By: Lovey Newcomer M.D.   On: 12/25/2021 14:54   CT ABDOMEN PELVIS WO CONTRAST  Result Date: 12/23/2021 CLINICAL DATA:  Lower abdominal pain, nausea EXAM: CT ABDOMEN AND PELVIS WITHOUT CONTRAST TECHNIQUE: Multidetector CT imaging of the abdomen and pelvis was performed following the standard protocol without IV contrast. RADIATION DOSE REDUCTION: This exam was performed according to the departmental dose-optimization program which includes automated exposure control, adjustment of the mA and/or kV according to patient size and/or use of iterative reconstruction technique. COMPARISON:  CT done on 12/02/2020, abdomen sonogram done on 04/17/2021 FINDINGS: Lower chest: Visualized lower lung fields are clear. Minimal pericardial effusion is present. Possible coronary artery calcifications are seen. Hepatobiliary: There is small subcentimeter low-density focus in the left lobe, possibly cyst  or hemangioma. There is no dilation of intrahepatic bile ducts. Surgical clips are seen in gallbladder fossa. Pancreas: No focal abnormalities are seen. Evaluation is limited in this noncontrast study. Spleen: Unremarkable. Adrenals/Urinary Tract: Adrenals are unremarkable. There is no hydronephrosis. There are no renal or ureteral stones. There are a few low-density lesions in the renal cortex on both sides largest measuring 2.5 cm in the midportion of right kidney. Findings suggest bilateral renal cysts. No follow-up imaging is recommended. Ureters are not dilated. Urinary bladder is not distended. Stomach/Bowel: Small hiatal hernia is seen. Small bowel loops are not dilated. Appendix is not seen. There is no pericecal inflammation. There is no significant wall thickening in colon. There is no pericolic stranding. Vascular/Lymphatic: Scattered arterial calcifications are seen in aorta and its major branches. Reproductive: Uterus is not seen. Other: There is no ascites or pneumoperitoneum. Musculoskeletal: Degenerative  changes are noted in lumbar spine with encroachment of neural foramina at multiple levels, more severe at L4-L5 level. IMPRESSION: There is no evidence of intestinal obstruction or pneumoperitoneum. There is no hydronephrosis. Small pericardial effusion. Small hiatal hernia. Arteriosclerosis. Lumbar spondylosis. Other findings as described in the body of the report. Electronically Signed   By: Elmer Picker M.D.   On: 12/23/2021 15:34   DG Chest Portable 1 View  Result Date: 12/23/2021 CLINICAL DATA:  Shortness of breath EXAM: PORTABLE CHEST 1 VIEW COMPARISON:  Chest x-ray November 05, 2021 FINDINGS: The heart size and mediastinal contours are within normal limits. Both lungs are clear. No large pleural effusion or pneumothorax. Surgical clips overlying the right axilla and right upper quadrant. No acute osseous abnormality. IMPRESSION: No acute cardiopulmonary abnormality. Electronically Signed   By: Beryle Flock M.D.   On: 12/23/2021 15:27    Microbiology: Results for orders placed or performed during the hospital encounter of 12/25/21  SARS Coronavirus 2 by RT PCR (hospital order, performed in [facility] hospital lab) *cepheid single result test* Anterior Nasal Swab     Status: None   Collection Time: 12/25/21  3:29 PM   Specimen: Anterior Nasal Swab  Result Value Ref Range Status   SARS Coronavirus 2 by RT PCR NEGATIVE NEGATIVE Final    Comment: (NOTE) SARS-CoV-2 target nucleic acids are NOT DETECTED.  The SARS-CoV-2 RNA is generally detectable in upper and lower respiratory specimens during the acute phase of infection. The lowest concentration of SARS-CoV-2 viral copies this assay can detect is 250 copies / mL. A negative result does not preclude SARS-CoV-2 infection and should not be used as the sole basis for treatment or other patient management decisions.  A negative result may occur with improper specimen collection / handling, submission of specimen other than  nasopharyngeal swab, presence of viral mutation(s) within the areas targeted by this assay, and inadequate number of viral copies (<250 copies / mL). A negative result must be combined with clinical observations, patient history, and epidemiological information.  Fact Sheet for Patients:   https://www.patel.info/  Fact Sheet for Healthcare Providers: https://hall.com/  This test is not yet approved or  cleared by the [place] FDA and has been authorized for detection and/or diagnosis of SARS-CoV-2 by FDA under an Emergency Use Authorization (EUA).  This EUA will remain in effect (meaning this test can be used) for the duration of the COVID-19 declaration under Section 564(b)(1) of the Act, 21 U.S.C. section 360bbb-3(b)(1), unless the authorization is terminated or revoked sooner.  Performed at [facility], [address] [place], [place] [address]    *  Note: Due to a large number of results and/or encounters for the requested time period, some results have not been displayed. A complete set of results can be found in Results Review.    Labs: CBC: Recent Labs  Lab 12/22/21 1323 12/23/21 1148 12/25/21 1419 12/26/21 0602 12/27/21 0424  WBC 9.9 11.9* 9.2 9.5 10.1  NEUTROABS 7.8*  --   --  7.7  --   HGB 11.2* 11.8* 11.7* 10.4* 10.3*  HCT 34.7* 37.0 37.2 32.8* 33.5*  MCV 89.4 88.5 89.9 90.9 91.5  PLT 245 261 259 202 469   Basic Metabolic Panel: Recent Labs  Lab 12/23/21 1148 12/25/21 1516 12/26/21 0602 12/27/21 0424 12/28/21 0429  NA 139 138 140 139 140  K 4.7 3.4* 3.9 4.2 4.3  CL 104 103 107 110 112*  CO2 23 23 23  21* 20*  GLUCOSE 87 104* 90 90 87  BUN 54* 67* 65* 66* 58*  CREATININE 2.17* 3.54* 3.67* 2.63* 2.32*  CALCIUM 9.8 9.1 8.7* 8.6* 9.0  MG  --   --  1.3*  --   --    Liver Function Tests: Recent Labs  Lab 12/23/21 1148 12/27/21 0424  AST 14* 14*  ALT 10 13  ALKPHOS 83 74  BILITOT  0.3 0.6  PROT 8.0 6.4*  ALBUMIN 4.0 2.8*   CBG: Recent Labs  Lab 12/27/21 0643 12/27/21 1158 12/27/21 1648 12/27/21 2111 12/28/21 0712  GLUCAP 83 122* 138* 155* 80    Discharge time spent: greater than 30 minutes.  Signed: Murray Hodgkins, MD [facility] 12/28/2021

## 2022-01-05 ENCOUNTER — Other Ambulatory Visit: Payer: Self-pay

## 2022-01-05 ENCOUNTER — Telehealth: Payer: Self-pay

## 2022-01-05 ENCOUNTER — Inpatient Hospital Stay: Payer: Medicare HMO

## 2022-01-05 VITALS — BP 162/69 | HR 85 | Temp 98.5°F | Resp 18

## 2022-01-05 DIAGNOSIS — Z853 Personal history of malignant neoplasm of breast: Secondary | ICD-10-CM | POA: Diagnosis not present

## 2022-01-05 DIAGNOSIS — D464 Refractory anemia, unspecified: Secondary | ICD-10-CM

## 2022-01-05 DIAGNOSIS — D631 Anemia in chronic kidney disease: Secondary | ICD-10-CM

## 2022-01-05 DIAGNOSIS — E538 Deficiency of other specified B group vitamins: Secondary | ICD-10-CM | POA: Diagnosis not present

## 2022-01-05 DIAGNOSIS — N184 Chronic kidney disease, stage 4 (severe): Secondary | ICD-10-CM | POA: Diagnosis not present

## 2022-01-05 DIAGNOSIS — D509 Iron deficiency anemia, unspecified: Secondary | ICD-10-CM

## 2022-01-05 LAB — CBC WITH DIFFERENTIAL (CANCER CENTER ONLY)
Abs Immature Granulocytes: 0.08 10*3/uL — ABNORMAL HIGH (ref 0.00–0.07)
Basophils Absolute: 0 10*3/uL (ref 0.0–0.1)
Basophils Relative: 0 %
Eosinophils Absolute: 0.1 10*3/uL (ref 0.0–0.5)
Eosinophils Relative: 1 %
HCT: 33.7 % — ABNORMAL LOW (ref 36.0–46.0)
Hemoglobin: 10.9 g/dL — ABNORMAL LOW (ref 12.0–15.0)
Immature Granulocytes: 1 %
Lymphocytes Relative: 14 %
Lymphs Abs: 1.3 10*3/uL (ref 0.7–4.0)
MCH: 28.6 pg (ref 26.0–34.0)
MCHC: 32.3 g/dL (ref 30.0–36.0)
MCV: 88.5 fL (ref 80.0–100.0)
Monocytes Absolute: 0.6 10*3/uL (ref 0.1–1.0)
Monocytes Relative: 6 %
Neutro Abs: 7.3 10*3/uL (ref 1.7–7.7)
Neutrophils Relative %: 78 %
Platelet Count: 255 10*3/uL (ref 150–400)
RBC: 3.81 MIL/uL — ABNORMAL LOW (ref 3.87–5.11)
RDW: 15.6 % — ABNORMAL HIGH (ref 11.5–15.5)
WBC Count: 9.3 10*3/uL (ref 4.0–10.5)
nRBC: 0 % (ref 0.0–0.2)

## 2022-01-05 LAB — SAMPLE TO BLOOD BANK

## 2022-01-05 MED ORDER — DARBEPOETIN ALFA 200 MCG/0.4ML IJ SOSY
200.0000 ug | PREFILLED_SYRINGE | Freq: Once | INTRAMUSCULAR | Status: AC
  Administered 2022-01-05: 200 ug via SUBCUTANEOUS
  Filled 2022-01-05: qty 0.4

## 2022-01-05 MED ORDER — CYANOCOBALAMIN 1000 MCG/ML IJ SOLN: 1000.0000 ug | Freq: Once | INTRAMUSCULAR | Status: DC

## 2022-01-05 NOTE — Telephone Encounter (Signed)
CRITICAL VALUE STICKER  CRITICAL VALUE: HGB 10.9  RECEIVER (on-site recipient of call): Hughes Wyndham P. LPN  DATE & TIME NOTIFIED: 10/20/ 23  2:05  MESSENGER (representative from lab): [name]  MD NOTIFIED: Dr. Burr Medico

## 2022-01-08 ENCOUNTER — Ambulatory Visit: Payer: Medicare HMO

## 2022-01-09 ENCOUNTER — Other Ambulatory Visit (HOSPITAL_COMMUNITY): Payer: Self-pay

## 2022-01-19 ENCOUNTER — Inpatient Hospital Stay: Payer: Medicare HMO | Attending: Hematology

## 2022-01-19 ENCOUNTER — Inpatient Hospital Stay: Payer: Medicare HMO

## 2022-01-19 ENCOUNTER — Other Ambulatory Visit: Payer: Self-pay

## 2022-01-19 DIAGNOSIS — E538 Deficiency of other specified B group vitamins: Secondary | ICD-10-CM | POA: Insufficient documentation

## 2022-01-19 DIAGNOSIS — N184 Chronic kidney disease, stage 4 (severe): Secondary | ICD-10-CM | POA: Insufficient documentation

## 2022-01-19 DIAGNOSIS — D509 Iron deficiency anemia, unspecified: Secondary | ICD-10-CM

## 2022-01-19 DIAGNOSIS — D631 Anemia in chronic kidney disease: Secondary | ICD-10-CM | POA: Diagnosis not present

## 2022-01-19 DIAGNOSIS — D464 Refractory anemia, unspecified: Secondary | ICD-10-CM

## 2022-01-19 LAB — CBC WITH DIFFERENTIAL (CANCER CENTER ONLY)
Abs Immature Granulocytes: 0.04 10*3/uL (ref 0.00–0.07)
Basophils Absolute: 0 10*3/uL (ref 0.0–0.1)
Basophils Relative: 0 %
Eosinophils Absolute: 0 10*3/uL (ref 0.0–0.5)
Eosinophils Relative: 1 %
HCT: 37.9 % (ref 36.0–46.0)
Hemoglobin: 12.2 g/dL (ref 12.0–15.0)
Immature Granulocytes: 1 %
Lymphocytes Relative: 17 %
Lymphs Abs: 1 10*3/uL (ref 0.7–4.0)
MCH: 29.1 pg (ref 26.0–34.0)
MCHC: 32.2 g/dL (ref 30.0–36.0)
MCV: 90.5 fL (ref 80.0–100.0)
Monocytes Absolute: 0.4 10*3/uL (ref 0.1–1.0)
Monocytes Relative: 8 %
Neutro Abs: 4.3 10*3/uL (ref 1.7–7.7)
Neutrophils Relative %: 73 %
Platelet Count: 267 10*3/uL (ref 150–400)
RBC: 4.19 MIL/uL (ref 3.87–5.11)
RDW: 15.8 % — ABNORMAL HIGH (ref 11.5–15.5)
WBC Count: 5.8 10*3/uL (ref 4.0–10.5)
nRBC: 0 % (ref 0.0–0.2)

## 2022-01-19 LAB — SAMPLE TO BLOOD BANK

## 2022-01-19 NOTE — Progress Notes (Signed)
Pt is here for Aranesp injection. Hgb is 12.2 today therefore she does not meet the parameters to receive Aranesp today. B12 injection is Q6 weeks which is not due today. Labs printed, and patient verbalized understanding.

## 2022-02-02 ENCOUNTER — Telehealth: Payer: Self-pay

## 2022-02-02 ENCOUNTER — Inpatient Hospital Stay: Payer: Medicare HMO

## 2022-02-02 ENCOUNTER — Inpatient Hospital Stay (HOSPITAL_COMMUNITY)
Admission: EM | Admit: 2022-02-02 | Discharge: 2022-02-06 | DRG: 683 | Disposition: A | Discharge status: Home / Self Care | Payer: Medicare HMO | Attending: Internal Medicine | Admitting: Internal Medicine

## 2022-02-02 ENCOUNTER — Other Ambulatory Visit: Payer: Self-pay

## 2022-02-02 ENCOUNTER — Encounter (HOSPITAL_COMMUNITY): Payer: Self-pay

## 2022-02-02 ENCOUNTER — Other Ambulatory Visit (HOSPITAL_COMMUNITY): Payer: Self-pay

## 2022-02-02 DIAGNOSIS — N189 Chronic kidney disease, unspecified: Secondary | ICD-10-CM

## 2022-02-02 DIAGNOSIS — I10 Essential (primary) hypertension: Secondary | ICD-10-CM | POA: Diagnosis present

## 2022-02-02 DIAGNOSIS — I129 Hypertensive chronic kidney disease with stage 1 through stage 4 chronic kidney disease, or unspecified chronic kidney disease: Secondary | ICD-10-CM | POA: Diagnosis not present

## 2022-02-02 DIAGNOSIS — N179 Acute kidney failure, unspecified: Secondary | ICD-10-CM | POA: Diagnosis not present

## 2022-02-02 DIAGNOSIS — Z862 Personal history of diseases of the blood and blood-forming organs and certain disorders involving the immune mechanism: Secondary | ICD-10-CM

## 2022-02-02 DIAGNOSIS — Z8249 Family history of ischemic heart disease and other diseases of the circulatory system: Secondary | ICD-10-CM

## 2022-02-02 DIAGNOSIS — E1122 Type 2 diabetes mellitus with diabetic chronic kidney disease: Secondary | ICD-10-CM | POA: Diagnosis present

## 2022-02-02 DIAGNOSIS — E538 Deficiency of other specified B group vitamins: Secondary | ICD-10-CM | POA: Diagnosis not present

## 2022-02-02 DIAGNOSIS — Z832 Family history of diseases of the blood and blood-forming organs and certain disorders involving the immune mechanism: Secondary | ICD-10-CM

## 2022-02-02 DIAGNOSIS — R8281 Pyuria: Secondary | ICD-10-CM | POA: Diagnosis present

## 2022-02-02 DIAGNOSIS — Z86718 Personal history of other venous thrombosis and embolism: Secondary | ICD-10-CM

## 2022-02-02 DIAGNOSIS — D509 Iron deficiency anemia, unspecified: Secondary | ICD-10-CM

## 2022-02-02 DIAGNOSIS — I4510 Unspecified right bundle-branch block: Secondary | ICD-10-CM | POA: Diagnosis present

## 2022-02-02 DIAGNOSIS — Z885 Allergy status to narcotic agent status: Secondary | ICD-10-CM

## 2022-02-02 DIAGNOSIS — Z853 Personal history of malignant neoplasm of breast: Secondary | ICD-10-CM

## 2022-02-02 DIAGNOSIS — N184 Chronic kidney disease, stage 4 (severe): Secondary | ICD-10-CM | POA: Diagnosis present

## 2022-02-02 DIAGNOSIS — Z833 Family history of diabetes mellitus: Secondary | ICD-10-CM

## 2022-02-02 DIAGNOSIS — I5032 Chronic diastolic (congestive) heart failure: Secondary | ICD-10-CM | POA: Diagnosis present

## 2022-02-02 DIAGNOSIS — Z800 Family history of malignant neoplasm of digestive organs: Secondary | ICD-10-CM

## 2022-02-02 DIAGNOSIS — Z79899 Other long term (current) drug therapy: Secondary | ICD-10-CM

## 2022-02-02 DIAGNOSIS — I2510 Atherosclerotic heart disease of native coronary artery without angina pectoris: Secondary | ICD-10-CM | POA: Diagnosis present

## 2022-02-02 DIAGNOSIS — D631 Anemia in chronic kidney disease: Secondary | ICD-10-CM

## 2022-02-02 DIAGNOSIS — Z7901 Long term (current) use of anticoagulants: Secondary | ICD-10-CM

## 2022-02-02 DIAGNOSIS — E1142 Type 2 diabetes mellitus with diabetic polyneuropathy: Secondary | ICD-10-CM | POA: Diagnosis present

## 2022-02-02 DIAGNOSIS — Z888 Allergy status to other drugs, medicaments and biological substances status: Secondary | ICD-10-CM

## 2022-02-02 DIAGNOSIS — D464 Refractory anemia, unspecified: Secondary | ICD-10-CM

## 2022-02-02 DIAGNOSIS — Z801 Family history of malignant neoplasm of trachea, bronchus and lung: Secondary | ICD-10-CM

## 2022-02-02 DIAGNOSIS — E785 Hyperlipidemia, unspecified: Secondary | ICD-10-CM | POA: Diagnosis present

## 2022-02-02 DIAGNOSIS — Z803 Family history of malignant neoplasm of breast: Secondary | ICD-10-CM

## 2022-02-02 DIAGNOSIS — E119 Type 2 diabetes mellitus without complications: Secondary | ICD-10-CM

## 2022-02-02 DIAGNOSIS — I130 Hypertensive heart and chronic kidney disease with heart failure and stage 1 through stage 4 chronic kidney disease, or unspecified chronic kidney disease: Secondary | ICD-10-CM | POA: Diagnosis present

## 2022-02-02 DIAGNOSIS — Z8616 Personal history of COVID-19: Secondary | ICD-10-CM

## 2022-02-02 DIAGNOSIS — Z923 Personal history of irradiation: Secondary | ICD-10-CM

## 2022-02-02 LAB — CBC WITH DIFFERENTIAL (CANCER CENTER ONLY)
Abs Immature Granulocytes: 0.04 10*3/uL (ref 0.00–0.07)
Basophils Absolute: 0 10*3/uL (ref 0.0–0.1)
Basophils Relative: 0 %
Eosinophils Absolute: 0 10*3/uL (ref 0.0–0.5)
Eosinophils Relative: 0 %
HCT: 35.2 % — ABNORMAL LOW (ref 36.0–46.0)
Hemoglobin: 11.5 g/dL — ABNORMAL LOW (ref 12.0–15.0)
Immature Granulocytes: 1 %
Lymphocytes Relative: 14 %
Lymphs Abs: 1.1 10*3/uL (ref 0.7–4.0)
MCH: 28.9 pg (ref 26.0–34.0)
MCHC: 32.7 g/dL (ref 30.0–36.0)
MCV: 88.4 fL (ref 80.0–100.0)
Monocytes Absolute: 0.6 10*3/uL (ref 0.1–1.0)
Monocytes Relative: 7 %
Neutro Abs: 5.9 10*3/uL (ref 1.7–7.7)
Neutrophils Relative %: 78 %
Platelet Count: 205 10*3/uL (ref 150–400)
RBC: 3.98 MIL/uL (ref 3.87–5.11)
RDW: 15.4 % (ref 11.5–15.5)
WBC Count: 7.6 10*3/uL (ref 4.0–10.5)
nRBC: 0 % (ref 0.0–0.2)

## 2022-02-02 LAB — URINALYSIS, ROUTINE W REFLEX MICROSCOPIC
Bilirubin Urine: NEGATIVE
Glucose, UA: NEGATIVE mg/dL
Hgb urine dipstick: NEGATIVE
Ketones, ur: NEGATIVE mg/dL
Nitrite: NEGATIVE
Protein, ur: 100 mg/dL — AB
Specific Gravity, Urine: 1.013 (ref 1.005–1.030)
pH: 5 (ref 5.0–8.0)

## 2022-02-02 LAB — CMP (CANCER CENTER ONLY)
ALT: 14 U/L (ref 0–44)
AST: 20 U/L (ref 15–41)
Albumin: 4.1 g/dL (ref 3.5–5.0)
Alkaline Phosphatase: 76 U/L (ref 38–126)
Anion gap: 11 (ref 5–15)
BUN: 98 mg/dL — ABNORMAL HIGH (ref 8–23)
CO2: 26 mmol/L (ref 22–32)
Calcium: 9.8 mg/dL (ref 8.9–10.3)
Chloride: 102 mmol/L (ref 98–111)
Creatinine: 5.35 mg/dL (ref 0.44–1.00)
GFR, Estimated: 8 mL/min — ABNORMAL LOW (ref >60)
Glucose, Bld: 90 mg/dL (ref 70–99)
Potassium: 4.7 mmol/L (ref 3.5–5.1)
Sodium: 139 mmol/L (ref 135–145)
Total Bilirubin: 0.4 mg/dL (ref 0.3–1.2)
Total Protein: 8.1 g/dL (ref 6.5–8.1)

## 2022-02-02 LAB — MAGNESIUM: Magnesium: 1.4 mg/dL — ABNORMAL LOW (ref 1.7–2.4)

## 2022-02-02 LAB — SAMPLE TO BLOOD BANK

## 2022-02-02 LAB — FERRITIN: Ferritin: 58 ng/mL (ref 11–307)

## 2022-02-02 MED ORDER — CEPHALEXIN 500 MG PO CAPS
500.0000 mg | ORAL_CAPSULE | Freq: Once | ORAL | Status: AC
  Administered 2022-02-02: 500 mg via ORAL
  Filled 2022-02-02: qty 1

## 2022-02-02 MED ORDER — CYANOCOBALAMIN 1000 MCG/ML IJ SOLN
1000.0000 ug | Freq: Once | INTRAMUSCULAR | Status: AC
  Administered 2022-02-02: 1000 ug via INTRAMUSCULAR
  Filled 2022-02-02: qty 1

## 2022-02-02 MED ORDER — SODIUM CHLORIDE 0.9 % IV BOLUS
500.0000 mL | Freq: Once | INTRAVENOUS | Status: AC
  Administered 2022-02-02: 500 mL via INTRAVENOUS

## 2022-02-02 MED ORDER — MAGNESIUM OXIDE -MG SUPPLEMENT 400 (240 MG) MG PO TABS
400.0000 mg | ORAL_TABLET | Freq: Once | ORAL | Status: AC
  Administered 2022-02-02: 400 mg via ORAL
  Filled 2022-02-02: qty 1

## 2022-02-02 NOTE — Progress Notes (Signed)
Patient is here for Aranesp and B12 injections. Her Hgb is 11.5 today therefore she does not need the Aranesp injection. B12 injection was given. Patient verbalized understanding and labs were printed and given.

## 2022-02-02 NOTE — ED Provider Triage Note (Signed)
Emergency Medicine Provider Triage Evaluation Note  Barbie [name] Toepfer , a 76 y.o. female  was evaluated in triage.  Patient reports that she had lab work done at her PCP that showed an elevated serum creatinine.  Says that she has been "creeping towards" having kidney disease.   Review of Systems  Positive:  Negative: Shortness of breath, chest pain, palpitations, lower extremity swelling  Physical Exam  BP (!) 173/123 (BP Location: Right Arm)   Pulse 95   Temp 98.3 F (36.8 C) (Oral)   Resp 16   SpO2 99%  Gen:   Awake, no distress   Resp:  Normal effort  MSK:   Moves extremities without difficulty  Other:    Medical Decision Making  Medically screening exam initiated at 8:15 PM.  Appropriate orders placed.  Pranavi [name] Fleig was informed that the remainder of the evaluation will be completed by another provider, this initial triage assessment does not replace that evaluation, and the importance of remaining in the ED until their evaluation is complete.     Rhae Hammock, PA-C 02/02/22 2017

## 2022-02-02 NOTE — ED Notes (Signed)
Attempted IV x1 without success. Patient states she is a very hard stick and usually has to have ultrasound.

## 2022-02-02 NOTE — ED Provider Notes (Signed)
[place] DEPT Provider Note   CSN: [id number] Arrival date & time: 02/02/22  1837     History  Chief Complaint  Patient presents with   Abnormal Labs    [name] is a 76 y.o. female with a past medical history of type 2 diabetes and congestive heart failure presenting today with an abnormal lab.  Reports serum creatinine with PCP was 5.35.  History of kidney disease.  Denies any lower extremity edema, shortness of breath, chest pain, palpitations, nausea, vomiting or diarrhea.  Does say that she has some mild back pain is not new.  HPI     Home Medications Prior to Admission medications   Medication Sig Start Date End Date Taking? Authorizing Provider  acetaminophen (TYLENOL) 325 MG tablet Take 2 tablets (650 mg total) by mouth every 6 (six) hours as needed for mild pain (or Fever >/= 101). Patient taking differently: Take 650 mg by mouth every 6 (six) hours as needed for mild pain. 11/08/21   Elgergawy, Silver Huguenin, MD  amLODipine (NORVASC) 10 MG tablet Take 1/2 tablets (5 mg total) by mouth daily. 10/31/21 11/30/21  Arrien, Jimmy Picket, MD  apixaban (ELIQUIS) 2.5 MG TABS tablet Take 1 tablet (2.5 mg total) by mouth 2 (two) times daily. 10/30/21   Arrien, Jimmy Picket, MD  budesonide (ENTOCORT EC) 3 MG 24 hr capsule Take 1 capsule by mouth once daily Patient taking differently: Take 3 mg by mouth daily. 11/30/20   Gatha Mayer, MD  ergocalciferol (VITAMIN D2) 1.25 MG (50000 UT) capsule Take 1 capsule (50,000 Units total) by mouth once a week. 08/02/20   Truitt Merle, MD  famotidine (PEPCID) 20 MG tablet Take 10 mg by mouth at bedtime. 10/14/21   [provider]  FLUoxetine (PROZAC) 20 MG capsule Take 20 mg by mouth daily. 10/12/21   [provider]  furosemide (LASIX) 40 MG tablet Take 1 tablet (40 mg total) by mouth daily. Patient taking differently: Take 40 mg by mouth daily as needed for fluid. 11/26/21 12/26/21  Lincoln Brigham, PA-C  lipase/protease/amylase (CREON) 36000 UNITS CPEP capsule Take 36,000 Units by mouth 3 (three) times daily with meals.    [provider]  loperamide (IMODIUM A-D) 2 MG tablet Take 4 mg by mouth 4 (four) times daily as needed for diarrhea or loose stools.    [provider]  mirtazapine (REMERON) 7.5 MG tablet Take 1 tablet (7.5 mg total) by mouth at bedtime. 12/22/20   Truitt Merle, MD  nitroGLYCERIN (NITROSTAT) 0.4 MG SL tablet DISSOLVE 1 TABLET UNDER THE TONGUE EVERY 5 MINUTES AS  NEEDED FOR CHEST PAIN. MAX  OF 3 TABLETS IN 15 MINUTES. CALL 911 IF PAIN PERSISTS. Patient taking differently: Place 0.4 mg under the tongue every 5 (five) minutes as needed for chest pain. 10/04/20   Jerline Pain, MD  ondansetron (ZOFRAN-ODT) 4 MG disintegrating tablet Take 1 tablet (4 mg total) by mouth every 8 (eight) hours as needed for nausea or vomiting. 12/23/21   [name], MD  pantoprazole (PROTONIX) 40 MG tablet Take 1 tablet (40 mg total) by mouth 2 (two) times daily. 12/23/21   [name], MD  potassium chloride (KLOR-CON) 20 MEQ packet Take 1 tablet (20 mEq) by mouth daily. 10/31/21 11/30/21  Arrien, Jimmy Picket, MD      Allergies    Percocet [oxycodone-acetaminophen]; Haloperidol lactate; Lorazepam; Morphine sulfate; Propoxyphene hcl; Tramadol hcl; Valium [diazepam]; Anesthetics, halogenated; Etomidate; Fentanyl; Haloperidol; Morphine sulfate;  Propoxyphene; Tramadol hcl; and Versed [midazolam]    Review of Systems   Review of Systems  Physical Exam Updated Vital Signs BP (!) 173/123 (BP Location: Right Arm)   Pulse 95   Temp 98.3 F (36.8 C) (Oral)   Resp 16   SpO2 99%  Physical Exam Vitals and nursing note reviewed.  Constitutional:      General: She is not in acute distress.    Appearance: Normal appearance. She is not ill-appearing.  HENT:     Head: Normocephalic and atraumatic.  Eyes:     General: No scleral icterus.    Conjunctiva/sclera:  Conjunctivae normal.  Cardiovascular:     Rate and Rhythm: Normal rate and regular rhythm.  Pulmonary:     Effort: Pulmonary effort is normal. No respiratory distress.  Abdominal:     General: Abdomen is flat.     Palpations: Abdomen is soft.     Tenderness: There is no abdominal tenderness. There is no right CVA tenderness or left CVA tenderness.  Musculoskeletal:     Right lower leg: No edema.     Left lower leg: No edema.  Skin:    General: Skin is warm and dry.     Findings: No rash.  Neurological:     Mental Status: She is alert.  Psychiatric:        Mood and Affect: Mood normal.        Behavior: Behavior normal.     ED Results / Procedures / Treatments   Labs (all labs ordered are listed, but only abnormal results are displayed) Labs Reviewed  URINALYSIS, ROUTINE W REFLEX MICROSCOPIC - Abnormal; Notable for the following components:      Result Value   APPearance CLOUDY (*)    Protein, ur 100 (*)    Leukocytes,Ua MODERATE (*)    Bacteria, UA MANY (*)    All other components within normal limits  MAGNESIUM - Abnormal; Notable for the following components:   Magnesium 1.4 (*)    All other components within normal limits    EKG None  Radiology No results found.  Procedures Procedures   Medications Ordered in ED Medications  cephALEXin (KEFLEX) capsule 500 mg (500 mg Oral Given 02/02/22 2247)  sodium chloride 0.9 % bolus 500 mL (0 mLs Intravenous Stopped 02/02/22 2347)  magnesium oxide (MAG-OX) tablet 400 mg (400 mg Oral Given 02/02/22 2247)    ED Course/ Medical Decision Making/ A&P                           Medical Decision Making Amount and/or Complexity of Data Reviewed Labs: ordered.  Risk OTC drugs. Prescription drug management. Decision regarding hospitalization.   76 year old female presenting with increased serum creatinine.  Differential includes but is not limited to acute on chronic renal disease, outlet obstruction, UTI,  pyelonephritis, dehydration.  This is not an exhaustive   Past Medical History / Co-morbidities / Social History: Hypertension, type 2 diabetes, history of DVT on Eliquis, CHF   Additional history: Per chart review patient was seen at the cancer center for B12 injections and Aranesp.  Routine labs were ordered and she was notified via telephone about her creatinine.   Physical Exam: Pertinent physical exam findings include Mild suprapubic tenderness No CVA tenderness' Paraspinals of the lumbar spine tender to palpation  Lab Tests: I ordered, and personally interpreted labs.  The pertinent results include: Creatinine 5.35, up from 2.32 1 month ago Normal potassium  UA with WBCs, bacteria and leukocytes.   Imaging Studies: Considered but patient has a nontender abdomen and no CVA tenderness.  Do not believe this is indicated.  Bladder scan was performed.    Medications: I ordered medication including fluid bolus, replacement magnesium and Keflex for first dose of antibiotics   MDM/Disposition: This is a 76 year old female presenting from PCP due to creatinine of 5.35.  History of chronic kidney disease however there this is a large jump from her baseline in the 2 range.  Magnesium low but potassium within normal limits.  No EKG changes.  Ultimately, patient requires admission to the hospital for acute on chronic renal failure.  Her UTI can be treated during her hospitalization.    I discussed this case with my attending physician Dr. Tomi Bamberger who cosigned this note including patient's presenting symptoms, physical exam, and planned diagnostics and interventions. Attending physician stated agreement with plan or made changes to plan which were implemented.      Final Clinical Impression(s) / ED Diagnoses Final diagnoses:  Acute renal failure superimposed on stage 4 chronic kidney disease, unspecified acute renal failure type [facility])    Rx / DC Orders ED Discharge Orders     None       Admit to Dr. Candis Musa, Cecilio Asper, PA-C 02/02/22 2357    Dorie Rank, MD 02/05/22 [phone]

## 2022-02-02 NOTE — ED Triage Notes (Signed)
Patient said she went to an appointment today and got a result that her creatinine was 5.35. Having lower back pain. Had a fall before her appointment. Did not hit her head. Takes eloquis.

## 2022-02-02 NOTE — Telephone Encounter (Signed)
Critical Lab Value reported: Scr+5.35  Notified Dr. Burr Medico

## 2022-02-03 ENCOUNTER — Encounter (HOSPITAL_COMMUNITY): Payer: Self-pay | Admitting: Internal Medicine

## 2022-02-03 ENCOUNTER — Inpatient Hospital Stay (HOSPITAL_COMMUNITY): Payer: Medicare HMO

## 2022-02-03 DIAGNOSIS — Z862 Personal history of diseases of the blood and blood-forming organs and certain disorders involving the immune mechanism: Secondary | ICD-10-CM

## 2022-02-03 DIAGNOSIS — D631 Anemia in chronic kidney disease: Secondary | ICD-10-CM | POA: Diagnosis not present

## 2022-02-03 DIAGNOSIS — Z833 Family history of diabetes mellitus: Secondary | ICD-10-CM | POA: Diagnosis not present

## 2022-02-03 DIAGNOSIS — N189 Chronic kidney disease, unspecified: Secondary | ICD-10-CM | POA: Diagnosis not present

## 2022-02-03 DIAGNOSIS — E1122 Type 2 diabetes mellitus with diabetic chronic kidney disease: Secondary | ICD-10-CM | POA: Diagnosis not present

## 2022-02-03 DIAGNOSIS — I6381 Other cerebral infarction due to occlusion or stenosis of small artery: Secondary | ICD-10-CM | POA: Diagnosis not present

## 2022-02-03 DIAGNOSIS — Z801 Family history of malignant neoplasm of trachea, bronchus and lung: Secondary | ICD-10-CM | POA: Diagnosis not present

## 2022-02-03 DIAGNOSIS — Z803 Family history of malignant neoplasm of breast: Secondary | ICD-10-CM | POA: Diagnosis not present

## 2022-02-03 DIAGNOSIS — R8281 Pyuria: Secondary | ICD-10-CM | POA: Diagnosis not present

## 2022-02-03 DIAGNOSIS — E785 Hyperlipidemia, unspecified: Secondary | ICD-10-CM | POA: Diagnosis not present

## 2022-02-03 DIAGNOSIS — Z8249 Family history of ischemic heart disease and other diseases of the circulatory system: Secondary | ICD-10-CM | POA: Diagnosis not present

## 2022-02-03 DIAGNOSIS — Z86718 Personal history of other venous thrombosis and embolism: Secondary | ICD-10-CM | POA: Diagnosis not present

## 2022-02-03 DIAGNOSIS — I5032 Chronic diastolic (congestive) heart failure: Secondary | ICD-10-CM

## 2022-02-03 DIAGNOSIS — I10 Essential (primary) hypertension: Secondary | ICD-10-CM

## 2022-02-03 DIAGNOSIS — N179 Acute kidney failure, unspecified: Secondary | ICD-10-CM | POA: Diagnosis not present

## 2022-02-03 DIAGNOSIS — Z853 Personal history of malignant neoplasm of breast: Secondary | ICD-10-CM | POA: Diagnosis not present

## 2022-02-03 DIAGNOSIS — I2510 Atherosclerotic heart disease of native coronary artery without angina pectoris: Secondary | ICD-10-CM | POA: Diagnosis not present

## 2022-02-03 DIAGNOSIS — N184 Chronic kidney disease, stage 4 (severe): Secondary | ICD-10-CM

## 2022-02-03 DIAGNOSIS — I4510 Unspecified right bundle-branch block: Secondary | ICD-10-CM | POA: Diagnosis not present

## 2022-02-03 DIAGNOSIS — E119 Type 2 diabetes mellitus without complications: Secondary | ICD-10-CM

## 2022-02-03 DIAGNOSIS — I130 Hypertensive heart and chronic kidney disease with heart failure and stage 1 through stage 4 chronic kidney disease, or unspecified chronic kidney disease: Secondary | ICD-10-CM | POA: Diagnosis not present

## 2022-02-03 DIAGNOSIS — Z923 Personal history of irradiation: Secondary | ICD-10-CM | POA: Diagnosis not present

## 2022-02-03 DIAGNOSIS — Z888 Allergy status to other drugs, medicaments and biological substances status: Secondary | ICD-10-CM | POA: Diagnosis not present

## 2022-02-03 DIAGNOSIS — N281 Cyst of kidney, acquired: Secondary | ICD-10-CM | POA: Diagnosis not present

## 2022-02-03 DIAGNOSIS — Z800 Family history of malignant neoplasm of digestive organs: Secondary | ICD-10-CM | POA: Diagnosis not present

## 2022-02-03 DIAGNOSIS — Z832 Family history of diseases of the blood and blood-forming organs and certain disorders involving the immune mechanism: Secondary | ICD-10-CM | POA: Diagnosis not present

## 2022-02-03 DIAGNOSIS — E1142 Type 2 diabetes mellitus with diabetic polyneuropathy: Secondary | ICD-10-CM | POA: Diagnosis not present

## 2022-02-03 DIAGNOSIS — N170 Acute kidney failure with tubular necrosis: Secondary | ICD-10-CM

## 2022-02-03 DIAGNOSIS — Z8616 Personal history of COVID-19: Secondary | ICD-10-CM | POA: Diagnosis not present

## 2022-02-03 DIAGNOSIS — R519 Headache, unspecified: Secondary | ICD-10-CM | POA: Diagnosis not present

## 2022-02-03 DIAGNOSIS — Z885 Allergy status to narcotic agent status: Secondary | ICD-10-CM | POA: Diagnosis not present

## 2022-02-03 DIAGNOSIS — Z7901 Long term (current) use of anticoagulants: Secondary | ICD-10-CM | POA: Diagnosis not present

## 2022-02-03 LAB — COMPREHENSIVE METABOLIC PANEL
ALT: 16 U/L (ref 0–44)
AST: 18 U/L (ref 15–41)
Albumin: 3.4 g/dL — ABNORMAL LOW (ref 3.5–5.0)
Alkaline Phosphatase: 74 U/L (ref 38–126)
Anion gap: 10 (ref 5–15)
BUN: 108 mg/dL — ABNORMAL HIGH (ref 8–23)
CO2: 25 mmol/L (ref 22–32)
Calcium: 9.1 mg/dL (ref 8.9–10.3)
Chloride: 107 mmol/L (ref 98–111)
Creatinine, Ser: 4.8 mg/dL — ABNORMAL HIGH (ref 0.44–1.00)
GFR, Estimated: 9 mL/min — ABNORMAL LOW (ref >60)
Glucose, Bld: 110 mg/dL — ABNORMAL HIGH (ref 70–99)
Potassium: 3.9 mmol/L (ref 3.5–5.1)
Sodium: 142 mmol/L (ref 135–145)
Total Bilirubin: 0.6 mg/dL (ref 0.3–1.2)
Total Protein: 7.4 g/dL (ref 6.5–8.1)

## 2022-02-03 LAB — CBC WITH DIFFERENTIAL/PLATELET
Abs Immature Granulocytes: 0.04 10*3/uL (ref 0.00–0.07)
Basophils Absolute: 0 10*3/uL (ref 0.0–0.1)
Basophils Relative: 0 %
Eosinophils Absolute: 0 10*3/uL (ref 0.0–0.5)
Eosinophils Relative: 0 %
HCT: 35.5 % — ABNORMAL LOW (ref 36.0–46.0)
Hemoglobin: 10.8 g/dL — ABNORMAL LOW (ref 12.0–15.0)
Immature Granulocytes: 1 %
Lymphocytes Relative: 14 %
Lymphs Abs: 1.1 10*3/uL (ref 0.7–4.0)
MCH: 27.9 pg (ref 26.0–34.0)
MCHC: 30.4 g/dL (ref 30.0–36.0)
MCV: 91.7 fL (ref 80.0–100.0)
Monocytes Absolute: 0.6 10*3/uL (ref 0.1–1.0)
Monocytes Relative: 7 %
Neutro Abs: 6.3 10*3/uL (ref 1.7–7.7)
Neutrophils Relative %: 78 %
Platelets: 204 10*3/uL (ref 150–400)
RBC: 3.87 MIL/uL (ref 3.87–5.11)
RDW: 15.5 % (ref 11.5–15.5)
WBC: 8.1 10*3/uL (ref 4.0–10.5)
nRBC: 0 % (ref 0.0–0.2)

## 2022-02-03 LAB — GLUCOSE, CAPILLARY
Glucose-Capillary: 79 mg/dL (ref 70–99)
Glucose-Capillary: 117 mg/dL — ABNORMAL HIGH (ref 70–99)
Glucose-Capillary: 119 mg/dL — ABNORMAL HIGH (ref 70–99)
Glucose-Capillary: 138 mg/dL — ABNORMAL HIGH (ref 70–99)

## 2022-02-03 LAB — PHOSPHORUS: Phosphorus: 5.1 mg/dL — ABNORMAL HIGH (ref 2.5–4.6)

## 2022-02-03 LAB — PROTEIN / CREATININE RATIO, URINE
Creatinine, Urine: 120 mg/dL
Protein Creatinine Ratio: 1.08 mg/mg{Cre} — ABNORMAL HIGH (ref 0.00–0.15)
Total Protein, Urine: 129 mg/dL

## 2022-02-03 LAB — PROTIME-INR
INR: 1.3 — ABNORMAL HIGH (ref 0.8–1.2)
Prothrombin Time: 16.5 seconds — ABNORMAL HIGH (ref 11.4–15.2)

## 2022-02-03 LAB — SODIUM, URINE, RANDOM: Sodium, Ur: 37 mmol/L

## 2022-02-03 LAB — MAGNESIUM: Magnesium: 1.9 mg/dL (ref 1.7–2.4)

## 2022-02-03 LAB — BRAIN NATRIURETIC PEPTIDE: B Natriuretic Peptide: 83.8 pg/mL (ref 0.0–100.0)

## 2022-02-03 LAB — CREATININE, URINE, RANDOM: Creatinine, Urine: 124 mg/dL

## 2022-02-03 MED ORDER — FLUOXETINE HCL 20 MG PO CAPS
20.0000 mg | ORAL_CAPSULE | Freq: Every day | ORAL | Status: DC
  Administered 2022-02-03 – 2022-02-06 (×4): 20 mg via ORAL
  Filled 2022-02-03 (×4): qty 1

## 2022-02-03 MED ORDER — INSULIN ASPART 100 UNIT/ML IJ SOLN
0.0000 [IU] | Freq: Three times a day (TID) | INTRAMUSCULAR | Status: DC
  Administered 2022-02-04 – 2022-02-05 (×2): 1 [IU] via SUBCUTANEOUS

## 2022-02-03 MED ORDER — BUDESONIDE 3 MG PO CPEP
3.0000 mg | ORAL_CAPSULE | Freq: Every day | ORAL | Status: DC
  Administered 2022-02-03 – 2022-02-06 (×4): 3 mg via ORAL
  Filled 2022-02-03 (×4): qty 1

## 2022-02-03 MED ORDER — PANTOPRAZOLE SODIUM 40 MG PO TBEC
40.0000 mg | DELAYED_RELEASE_TABLET | Freq: Two times a day (BID) | ORAL | Status: DC
  Administered 2022-02-03 – 2022-02-06 (×8): 40 mg via ORAL
  Filled 2022-02-03 (×8): qty 1

## 2022-02-03 MED ORDER — AMLODIPINE BESYLATE 5 MG PO TABS
5.0000 mg | ORAL_TABLET | Freq: Once | ORAL | Status: AC
  Administered 2022-02-03: 5 mg via ORAL
  Filled 2022-02-03: qty 1

## 2022-02-03 MED ORDER — FAMOTIDINE 20 MG PO TABS
10.0000 mg | ORAL_TABLET | Freq: Every day | ORAL | Status: DC
  Administered 2022-02-03 – 2022-02-05 (×4): 10 mg via ORAL
  Filled 2022-02-03 (×4): qty 1

## 2022-02-03 MED ORDER — LACTATED RINGERS IV SOLN: INTRAVENOUS | Status: DC

## 2022-02-03 MED ORDER — MAGNESIUM SULFATE 2 GM/50ML IV SOLN
2.0000 g | Freq: Once | INTRAVENOUS | Status: AC
  Administered 2022-02-03: 2 g via INTRAVENOUS
  Filled 2022-02-03: qty 50

## 2022-02-03 MED ORDER — AMLODIPINE BESYLATE 5 MG PO TABS
5.0000 mg | ORAL_TABLET | Freq: Every day | ORAL | Status: DC
  Administered 2022-02-03 – 2022-02-04 (×2): 5 mg via ORAL
  Filled 2022-02-03 (×2): qty 1

## 2022-02-03 MED ORDER — APIXABAN 2.5 MG PO TABS
2.5000 mg | ORAL_TABLET | Freq: Two times a day (BID) | ORAL | Status: DC
  Administered 2022-02-03 – 2022-02-06 (×8): 2.5 mg via ORAL
  Filled 2022-02-03 (×8): qty 1

## 2022-02-03 MED ORDER — ACETAMINOPHEN 650 MG RE SUPP: 650.0000 mg | Freq: Four times a day (QID) | RECTAL | Status: DC | PRN

## 2022-02-03 MED ORDER — MELATONIN 3 MG PO TABS
3.0000 mg | ORAL_TABLET | Freq: Every evening | ORAL | Status: DC | PRN
  Administered 2022-02-03: 3 mg via ORAL
  Filled 2022-02-03: qty 1

## 2022-02-03 MED ORDER — PANCRELIPASE (LIP-PROT-AMYL) 12000-38000 UNITS PO CPEP
36000.0000 [IU] | ORAL_CAPSULE | Freq: Three times a day (TID) | ORAL | Status: DC
  Administered 2022-02-03 – 2022-02-06 (×11): 36000 [IU] via ORAL
  Filled 2022-02-03: qty 1
  Filled 2022-02-03 (×11): qty 3

## 2022-02-03 MED ORDER — HYDRALAZINE HCL 20 MG/ML IJ SOLN
10.0000 mg | INTRAMUSCULAR | Status: DC | PRN
  Administered 2022-02-05: 10 mg via INTRAVENOUS
  Filled 2022-02-03 (×2): qty 1

## 2022-02-03 MED ORDER — ACETAMINOPHEN 325 MG PO TABS
650.0000 mg | ORAL_TABLET | Freq: Four times a day (QID) | ORAL | Status: DC | PRN
  Administered 2022-02-03 – 2022-02-05 (×2): 650 mg via ORAL
  Filled 2022-02-03 (×2): qty 2

## 2022-02-03 MED ORDER — TRAMADOL HCL 50 MG PO TABS
50.0000 mg | ORAL_TABLET | Freq: Two times a day (BID) | ORAL | Status: DC | PRN
  Administered 2022-02-03 – 2022-02-05 (×2): 50 mg via ORAL
  Filled 2022-02-03 (×2): qty 1

## 2022-02-03 NOTE — Progress Notes (Signed)
Patient seen and examined.  Admitted by nighttime hospitalist early morning hours.  H&P and assessment and plan reviewed. In brief, 76 year old with CKD stage IV with baseline creatinine about 2.3-3 and followed by outpatient nephrology who was in a routine visit to hematology clinic for aranesp and routine lab test showed creatinine of 5.8.  She was sent to the emergency room.  Patient was without any complaints.  Overnight on gentle IV fluid hydration and her creatinine already trending down.  Urine output is adequate and unmeasured.  Patient tells me she feels fine and denies any complaints at this time. I curbside consulted with nephrology, no acute intervention needed.  We will continue gentle IV fluids tomorrow to try to bring her renal functions to her baseline.  Resume all care.  Mobilize. Called and updated patient's daughter who is also one of psychiatry provider.   Total time spent: 35 minutes.  Same-day visit.  No charge.

## 2022-02-03 NOTE — H&P (Addendum)
History and Physical      [name] GEZ:[id number] DOB: [birth year] DOA: 02/02/2022  PCP: Seward Carol, MD  Patient coming from: home   I have personally briefly reviewed patient's old medical records in [place]  Chief Complaint: Outpatient laboratory abnormality  HPI: [name] is a 76 y.o. female with medical history significant for stage IV CKD with baseline creatinine approximately 6.5-0.3, chronic diastolic heart failure, type 2 diabetes mellitus, hypertension, renal vein thrombosis chronically anticoagulated on Eliquis, anemia of chronic kidney disease, who is admitted to [facility] on 02/02/2022 with acute kidney injury superimposed on stage IV CKD after presenting from home to [facility] ED for further evaluation and management of interval increase in serum creatinine per routine outpatient labs.  Patient with a documented history of stage IV CKD associated with baseline creatinine range of 2.3-3.0.  On 02/02/2022, she underwent routine outpatient labs, which reportedly reflected interval increase in serum creatinine to greater than 5, prompting the ordering provider to recommend to the patient that she present to the local emergency department for further evaluation and management thereof.  Patient conveys that she is surprised at this interval increase in her serum creatinine, as she denies any acute symptoms.  Denies any recent change in appetite, reports good oral intake.  Denies any recent generalized weakness, fatigue, somnolence.  No recent nausea, vomiting, diarrhea, rash.  Continues to report good urine output, without any recent change thereof.  Denies any recent dysuria, gross hematuria, or change in urinary urgency/frequency.  Denies any recent chest pain, palpitations, diaphoresis, shortness of breath, dizziness, presyncope, or syncope.  No recent orthopnea, PND, or worsening of peripheral edema.  Denies any significant recent changes in her home  medication regimen.   Follows with Dr. Hollie Salk As her outpatient nephrologist, noting her next scheduled follow-up appointment with Dr. Hollie Salk will occur in February 2024.   She also has a history of chronic diastolic heart failure, with most recent echocardiogram, which was performed on 10/24/2021, and was notable for LVEF 66 5%, no focal noninsured rallies, indeterminate diastolic parameters, normal right ventricular systolic function.  She conveys that she is on Lasix as an outpatient, but that it is ordered on a as needed basis only.  She denies any recent weight gain to prompt her to take any prn doses of Lasix, states that she has not recently any Lasix nor any medications.  She also has a history of type 2 diabetes mellitus, which she states is managed via lifestyle modifications, with chart review revealing most recent hemoglobin A1c to be 5.5% when checked at in August 2023.     ED Course:  Vital signs in the ED were notable for the following: Afebrile; heart rate initially in the 90s, [place] improving into the low 80s following initiation of IV fluids; systolic blood pressures in the 160s to 170s; respiratory rate 14-18, with oxygen saturation 96 to 100% on room air.  Labs were notable for the following: CMP notable for the following: Sodium 139, potassium 4.7, bicarbonate 26, anion gap 11, creatinine 5.35 compared to most recent prior value of 2.3 210 1223, liver enzymes within normal limits.  Serum magnesium level 1.4.  CBC notable for white blood count 7600, hemoglobin 11.5 associated Neuraceq/normochromic findings and relative to most recent prior value of 12.2 on 01/19/2022, and platelet count 205.  Urinalysis shows 11-20 white blood cells, nitrate negative, 11-20 squamous epithelial cells, 100 protein, and no RBCs.  Per my interpretation, EKG in ED  demonstrated the following: Sinus rhythm with right bundle branch block, heart rate 89, no evidence of T wave or ST changes, including no  evidence of ST elevation.  While in the ED, the following were administered: Keflex, oral magnesium, Norvasc 5 mg p.o. x1, normal saline x500 cc bolus.  Subsequently, the patient was admitted for further evaluation management of her presenting acute kidney injury superimposed on CKD stage IV, with presentation also notable for hypomagnesemia.    Review of Systems: As per HPI otherwise 10 point review of systems negative.   Past Medical History:  Diagnosis Date   Abdominal discomfort    Chronic N/V/D. Presumptive dx Crohn's dx per elevated p ANCA. Failed Entocort and Pentasa. Sep 2003 - ileocolectomy c anastomosis per Dr Deon Pilling 2/2 adhesions - path was hegative for Crohns. EGD, Sm bowel follow through (11/03), and an eteroclysis (10/03) were unrevealing. Cuases hypomag and hypocalcemia.   Adnexal mass 8/03   s/p lap BSO (R ovarian fibroma) & lysis of adhesions   Allergy    Seasonal   Anemia    Multifactorial. Baseline HgB 10-11 ish. B12 def - 150 in 3/10. Fe Def - ferritin 35 3/10. Both are being repleted.   Breast cancer ([place]) 03/16/13   right, 5 o'clock   CAD (coronary artery disease) 1996   1996 - PTCA and angioplasty diagonal branch. 2000 - Rotoblator & angiopllasty of diagonal. 2006 - subendocardial AMI, DES to proximal LAD.Marland Kitchen Also had 90% stenosis in distal apical LAD. EF 55 with apical hypokinesis. Indefinite ASA and Plavix.   CHF (congestive heart failure) (HCC)    Chronic kidney disease    Chronic renal insuff baseline Cr 1.2 - 1.4 ish.   Chronic kidney disease, stage III (moderate) (HCC)    Chronic renal insuff baseline Cr 1.2 - 1.4 ish. New baseline about 2 (2018)   Chronic pain    CT 10/10 = Spinal stenosis L2 - S1.   COVID-19 11/02/2020   Diabetes mellitus    Insulin dependent   Diabetes mellitus type 2 in obese ([place]) 05/03/2010   Managed on lantus and novolog. Has diabetic nephropathy. Metformin D/C'd 2012 2/2 creatinine.  No diabetic retinopathy per 3/11.    Gout    Hx  of radiation therapy 06/02/13- 07/16/13   right rbeast 4500 cGy 25 sessions, right breast boost 1600 cGy in 8 sessions   Hyperlipidemia    Managed with both a statin and Welchol. Welchol stopped 2014 2/2 cost and started on fenofibrate    Hypertension    2006 B renal arteries patent. 2003 MRA - no RAS. 2003 pheo W/U Dr Hassell Done reportedly negative.   Hypoxia 07/23/2017   Lupus ([place])    Lymphedema of breast    Personal history of radiation therapy 2015   Pneumonia of left lung due to infectious organism    RBBB    Renal vein thrombosis (HCC)    SBO (small bowel obstruction) ([place]) 09/17/2017   Secondary hyperparathyroidism ([place])    Stroke [facility])    Incidental finding MRI 2002 L lacunar infarct   Vitamin B12 deficiency    Vitamin D deficiency    Wears dentures    top    Past Surgical History:  Procedure Laterality Date   ABDOMINAL HYSTERECTOMY     BILATERAL SALPINGOOPHORECTOMY  8/03   Lap BSO (R ovarian fibroma) and adhesion lysis   BIOPSY  09/08/2020   Procedure: BIOPSY;  Surgeon: Ronald Lobo, MD;  Location: [facility];  Service: Endoscopy;;   BOWEL  RESECTION  2003   ileocolectomy with anastomosis 2/2 adhesions   BREAST BIOPSY Right 2015   BREAST BIOPSY Right 07/2014   BREAST LUMPECTOMY Right 04/22/2013   BREAST LUMPECTOMY WITH NEEDLE LOCALIZATION AND AXILLARY SENTINEL LYMPH NODE BX Right 04/22/2013   Procedure: BREAST LUMPECTOMY WITH NEEDLE LOCALIZATION AND AXILLARY SENTINEL LYMPH NODE BX;  Surgeon: Stark Klein, MD;  Location: [facility];  Service: General;  Laterality: Right;   CARDIAC CATHETERIZATION     2 stents   CHOLECYSTECTOMY     COLONOSCOPY     ESOPHAGOGASTRODUODENOSCOPY N/A 09/08/2020   Procedure: ESOPHAGOGASTRODUODENOSCOPY (EGD);  Surgeon: Ronald Lobo, MD;  Location: [facility];  Service: Endoscopy;  Laterality: N/A;   HEMICOLECTOMY     R sided hemicolectomy   HERNIA REPAIR     Ventral hernia repair   PTCA  4/06    Social History:  reports  that she has never smoked. She has never used smokeless tobacco. She reports that she does not drink alcohol and does not use drugs.   Allergies  Allergen Reactions   Percocet [Oxycodone-Acetaminophen] Other (See Comments)    headaches   Haloperidol Lactate Nausea And Vomiting   Lorazepam Nausea Only   Morphine Sulfate Other (See Comments)    Agitation   Propoxyphene Hcl Nausea And Vomiting   Tramadol Hcl Nausea And Vomiting   Valium [Diazepam] Other (See Comments)    Agitation    Anesthetics, Halogenated Other (See Comments)    Per patient's daughter, she can not tolerate anesthetics.    Etomidate Other (See Comments)    Heart stopped during anesthesia   Fentanyl Other (See Comments)    Heart stopped during anesthesia    Haloperidol Nausea And Vomiting   Morphine Sulfate Nausea Only   Propoxyphene Nausea And Vomiting   Tramadol Hcl Nausea And Vomiting   Versed [Midazolam] Other (See Comments)    Heart stopped during anesthesia    Family History  Problem Relation Age of Onset   Pancreatic cancer Brother 3   Breast cancer Mother 69   Lung cancer Maternal Aunt    Breast cancer Maternal Aunt    Prostate cancer Maternal Uncle    Heart attack Maternal Grandmother    Breast cancer Maternal Grandmother    Colon cancer Maternal Grandmother    Clotting disorder Maternal Grandmother    Diabetes Maternal Grandmother    Kidney failure Maternal Aunt    Hypertension Daughter     Family history reviewed and not pertinent    Prior to Admission medications   Medication Sig Start Date End Date Taking? Authorizing Provider  acetaminophen (TYLENOL) 325 MG tablet Take 2 tablets (650 mg total) by mouth every 6 (six) hours as needed for mild pain (or Fever >/= 101). Patient taking differently: Take 650 mg by mouth every 6 (six) hours as needed for mild pain. 11/08/21  Yes Elgergawy, Silver Huguenin, MD  amLODipine (NORVASC) 10 MG tablet Take 1/2 tablets (5 mg total) by mouth daily. 10/31/21  02/02/22 Yes Arrien, Jimmy Picket, MD  apixaban (ELIQUIS) 2.5 MG TABS tablet Take 1 tablet (2.5 mg total) by mouth 2 (two) times daily. 10/30/21  Yes Arrien, Jimmy Picket, MD  budesonide (ENTOCORT EC) 3 MG 24 hr capsule Take 1 capsule by mouth once daily Patient taking differently: Take 3 mg by mouth daily. 11/30/20  Yes Gatha Mayer, MD  ergocalciferol (VITAMIN D2) 1.25 MG (50000 UT) capsule Take 1 capsule (50,000 Units total) by mouth once a week. 08/02/20  Yes  Truitt Merle, MD  famotidine (PEPCID) 20 MG tablet Take 10 mg by mouth at bedtime. 10/14/21  Yes [provider]  FLUoxetine (PROZAC) 20 MG capsule Take 20 mg by mouth daily. 10/12/21  Yes [provider]  furosemide (LASIX) 40 MG tablet Take 1 tablet (40 mg total) by mouth daily. Patient taking differently: Take 40 mg by mouth daily as needed for fluid. 11/26/21 02/02/22 Yes Lincoln Brigham, PA-C  lipase/protease/amylase (CREON) 36000 UNITS CPEP capsule Take 36,000 Units by mouth 3 (three) times daily with meals.   Yes [provider]  loperamide (IMODIUM A-D) 2 MG tablet Take 4 mg by mouth 4 (four) times daily as needed for diarrhea or loose stools.   Yes [provider]  mirtazapine (REMERON) 7.5 MG tablet Take 1 tablet (7.5 mg total) by mouth at bedtime. 12/22/20  Yes Truitt Merle, MD  nitroGLYCERIN (NITROSTAT) 0.4 MG SL tablet DISSOLVE 1 TABLET UNDER THE TONGUE EVERY 5 MINUTES AS  NEEDED FOR CHEST PAIN. MAX  OF 3 TABLETS IN 15 MINUTES. CALL 911 IF PAIN PERSISTS. Patient taking differently: Place 0.4 mg under the tongue every 5 (five) minutes as needed for chest pain. 10/04/20  Yes Jerline Pain, MD  ondansetron (ZOFRAN-ODT) 4 MG disintegrating tablet Take 1 tablet (4 mg total) by mouth every 8 (eight) hours as needed for nausea or vomiting. 12/23/21  Yes [name], MD  pantoprazole (PROTONIX) 40 MG tablet Take 1 tablet (40 mg total) by mouth 2 (two) times daily. 12/23/21  Yes [name], MD   potassium chloride (KLOR-CON) 20 MEQ packet Take 1 tablet (20 mEq) by mouth daily. 10/31/21 02/02/22 Yes Arrien, Jimmy Picket, MD     Objective    Physical Exam: Vitals:   02/02/22 2315 02/02/22 2345 02/03/22 0000 02/03/22 0145  BP: (!) 189/79 (!) 190/80 (!) 175/79 (!) 155/80  Pulse: 84 85 81 82  Resp:  18 18   Temp:    97.7 F (36.5 C)  TempSrc:    Oral  SpO2: 99% 97% 97% 96%    General: appears to be stated age; alert, oriented Skin: warm, dry, no rash Head:  AT/[place] Mouth:  Oral mucosa membranes appear dry, normal dentition Neck: supple; trachea midline Heart:  RRR; did not appreciate any M/R/G Lungs: CTAB, did not appreciate any wheezes, rales, or rhonchi Abdomen: + BS; soft, ND, NT Vascular: 2+ pedal pulses b/l; 2+ radial pulses b/l Extremities: no peripheral edema, no muscle wasting Neuro: strength and sensation intact in upper and lower extremities b/l     Labs on Admission: I have personally reviewed following labs and imaging studies  CBC: Recent Labs  Lab 02/02/22 1351  WBC 7.6  NEUTROABS 5.9  HGB 11.5*  HCT 35.2*  MCV 88.4  PLT 161   Basic Metabolic Panel: Recent Labs  Lab 02/02/22 1351 02/02/22 1932  NA 139  --   K 4.7  --   CL 102  --   CO2 26  --   GLUCOSE 90  --   BUN 98*  --   CREATININE 5.35*  --   CALCIUM 9.8  --   MG  --  1.4*   GFR: CrCl cannot be calculated (Unknown ideal weight.). Liver Function Tests: Recent Labs  Lab 02/02/22 1351  AST 20  ALT 14  ALKPHOS 76  BILITOT 0.4  PROT 8.1  ALBUMIN 4.1   No results for input(s): "LIPASE", "AMYLASE" in the last 168 hours. No results for input(s): "AMMONIA" in  the last 168 hours. Coagulation Profile: No results for input(s): "INR", "PROTIME" in the last 168 hours. Cardiac Enzymes: No results for input(s): "CKTOTAL", "CKMB", "CKMBINDEX", "TROPONINI" in the last 168 hours. BNP (last 3 results) No results for input(s): "PROBNP" in the last 8760 hours. HbA1C: No results  for input(s): "HGBA1C" in the last 72 hours. CBG: No results for input(s): "GLUCAP" in the last 168 hours. Lipid Profile: No results for input(s): "CHOL", "HDL", "LDLCALC", "TRIG", "CHOLHDL", "LDLDIRECT" in the last 72 hours. Thyroid Function Tests: No results for input(s): "TSH", "T4TOTAL", "FREET4", "T3FREE", "THYROIDAB" in the last 72 hours. Anemia Panel: Recent Labs    02/02/22 1351  FERRITIN 58   Urine analysis:    Component Value Date/Time   COLORURINE YELLOW 02/02/2022 1953   APPEARANCEUR CLOUDY (A) 02/02/2022 1953   APPEARANCEUR Clear 08/22/2018 1620   LABSPEC 1.013 02/02/2022 1953   LABSPEC 1.020 03/03/2014 1341   PHURINE 5.0 02/02/2022 1953   GLUCOSEU NEGATIVE 02/02/2022 1953   GLUCOSEU Negative 03/03/2014 1341   HGBUR NEGATIVE 02/02/2022 1953   BILIRUBINUR NEGATIVE 02/02/2022 1953   BILIRUBINUR Negative 08/22/2018 1620   BILIRUBINUR Negative 03/03/2014 1341   KETONESUR NEGATIVE 02/02/2022 1953   PROTEINUR 100 (A) 02/02/2022 1953   UROBILINOGEN 0.2 03/03/2014 1341   NITRITE NEGATIVE 02/02/2022 1953   LEUKOCYTESUR MODERATE (A) 02/02/2022 1953   LEUKOCYTESUR Negative 03/03/2014 1341    Radiological Exams on Admission: No results found.    Assessment/Plan    Principal Problem:   Acute renal failure superimposed on stage 4 chronic kidney disease (HCC) Active Problems:   Essential hypertension   History of anemia due to chronic kidney disease   Chronic diastolic CHF (congestive heart failure) (HCC)   DM2 (diabetes mellitus, type 2) (HCC)   History of renal vein thrombosis   Hypomagnesemia   Pyuria, sterile     #) Acute Kidney Injury superimposed on CKD stage IV: Relative to baseline creatinine range approximate 2.3-3.0 routine outpatient labs reflect interval increase in serum creatinine to greater than 5.  Appears asymptomatic as relates to this interval increase in her serum creatinine.  Unclear source of this acute exacerbation.  No clinical criteria  to warrant urgent/emergent hemodialysis, including serum potassium level that is within normal limits, no evidence of anion gap metabolic acidosis, no evidence of uremia, and no evidence of volume overload.  Urinalysis with microscopy notable for 11-20 white blood cells, 100 protein, and no RBCs.  In general, will further expand work-up as detailed below, while providing interval gentle IV fluids overnight, with consideration for discussing with nephrology if no interval improvement in patient's renal function with these measures.  The patient's mild initial tachycardia improved with IV fluids in the emergency department, suggesting potential contribution from relative dehydration.  Provide additional gentle IV fluids overnight, will closely monitoring ensuing renal function trend in response to this, as further detailed below.   Plan: monitor strict I's & O's and daily weights. Attempt to avoid nephrotoxic agents. Refrain from NSAIDs. Repeat CMP in the morning.  Repeat serum magnesium level in the a.m, and check serum phosphorus level at that time as well. Add-on random urine sodium, random urine creatinine, and random urine protein to creatinine ratio.  Renal ultrasound ordered for the morning.  Add on BNP.  Holding home scheduled potassium supplementation.                #) Hypomagnesemia: Presenting serum magnesium level of 1.4.  Plan: Magnesium sulfate 2 g IV retears x1 dose  now.  Repeat serum magnesium level in the morning.  Monitor on telemetry.            #) Asymptomatic pyuria: Presenting urinalysis with microscopy included finding of 11-20 white blood cells.  Denies any acute urinary symptoms.  Additionally, the presence of squamous cells is suggestive of a contaminated specimen.  Overall, in the absence of any acute urinary symptoms, with urinary white blood cells not meeting quantitative threshold for significance in a female, and in the context of concern for  contaminated specimen, clinically, this patient does not appear to be consistent with an acute urinary tract infection.  She does not meet SIRS criteria for sepsis, and appears hemodynamically stable.  Consequently, we will refrain from additional antibiotics at this time.  Plan: Repeat CBC with differential in the morning.  Refraining from additional antibiotics for now.                #) Chronic diastolic heart failure: documented history of such, with most recent echocardiogram performed in August 2023, which is notable for LVEF 60 to 65%, indeterminate diastolic parameters, and additional details as conveyed above. No clinical evidence to suggest acutely decompensated heart failure at this time. home diuretic regimen reportedly consists of the following: Lasix, which she takes on a as needed basis only.    Plan: monitor strict I's & O's and daily weights. Repeat CMP in AM.  Recheck serum mag level in the morning.  Holding home Lasix for now.              #) Essential Hypertension: documented h/o such, with outpatient antihypertensive regimen including amlodipine.  SBP's in the ED today: 160s to 170s mmHg. however, patient has not yet had her daily amlodipine.  In terms of prn the options, we will elect to pursue hydralazine as opposed to labetalol as they will avoid increased risk for hyper kalemia this patient is already at risk for development/given AKI on CKD 4.  Plan: Close monitoring of subsequent BP via routine VS. resume home amlodipine.  As needed IV hydralazine for systolic blood pressure greater than 517 or diastolic blood pressure greater than 120.            #) History of renal vein thrombosis: Per chart review, she has a documented history of this, noted to have occurred in 2019, prompting initiation of chronic anticoagulation on Eliquis.   Plan: Continue home Eliquis.  Repeat CBC in the morning.          #) Anemia of chronic kidney disease:  Documented history of such, associated baseline hemoglobin range 8-12, with presenting hemoglobin consistent with this baseline range, and in the absence of any evidence of active bleed.  Plan: Repeat CBC in the morning.  Check INR.        DVT prophylaxis: SCD's + continue home Eliquis Code Status: Full code Family Communication: none Disposition Plan: Per Rounding Team Consults called: none;  Admission status: Inpatient     Crump DO [facility] [place]   02/03/2022, 2:26 AM

## 2022-02-04 DIAGNOSIS — N184 Chronic kidney disease, stage 4 (severe): Secondary | ICD-10-CM | POA: Diagnosis not present

## 2022-02-04 DIAGNOSIS — N179 Acute kidney failure, unspecified: Secondary | ICD-10-CM | POA: Diagnosis not present

## 2022-02-04 LAB — BASIC METABOLIC PANEL
Anion gap: 8 (ref 5–15)
BUN: 96 mg/dL — ABNORMAL HIGH (ref 8–23)
CO2: 25 mmol/L (ref 22–32)
Calcium: 9.2 mg/dL (ref 8.9–10.3)
Chloride: 107 mmol/L (ref 98–111)
Creatinine, Ser: 4 mg/dL — ABNORMAL HIGH (ref 0.44–1.00)
GFR, Estimated: 11 mL/min — ABNORMAL LOW (ref >60)
Glucose, Bld: 94 mg/dL (ref 70–99)
Potassium: 4.3 mmol/L (ref 3.5–5.1)
Sodium: 140 mmol/L (ref 135–145)

## 2022-02-04 LAB — GLUCOSE, CAPILLARY
Glucose-Capillary: 84 mg/dL (ref 70–99)
Glucose-Capillary: 77 mg/dL (ref 70–99)
Glucose-Capillary: 200 mg/dL — ABNORMAL HIGH (ref 70–99)
Glucose-Capillary: 131 mg/dL — ABNORMAL HIGH (ref 70–99)

## 2022-02-04 MED ORDER — SODIUM CHLORIDE 0.9 % IV SOLN: INTRAVENOUS | Status: DC

## 2022-02-04 NOTE — Plan of Care (Signed)

## 2022-02-04 NOTE — Progress Notes (Signed)
PROGRESS NOTE  [name]  JME:[id number] DOB: 1945/11/07 DOA: 02/02/2022 PCP: Seward Carol, MD   Brief Narrative:  76 year old with CKD stage IV with baseline creatinine about 2.5-3.5 and followed by outpatient nephrology who was in a routine visit to hematology clinic for aranesp and routine lab test showed creatinine of 5.8.  She was sent to the emergency room.  Admitted for further management of AKI on CKD stage IV.  Started on gentle IV fluids.  Kidney function slowly improving with IV fluids.  Planning to continue 1 more day of IV fluid and discharge home tomorrow if further improvement in the renal function  Assessment & Plan:  Principal Problem:   Acute renal failure superimposed on stage 4 chronic kidney disease ([place]) Active Problems:   Essential hypertension   History of anemia due to chronic kidney disease   Chronic diastolic CHF (congestive heart failure) (HCC)   DM2 (diabetes mellitus, type 2) (HCC)   History of renal vein thrombosis   Hypomagnesemia   Pyuria, sterile   AKI on CKD stage IV: Baseline creatinine is around 2.5-3.5 upon review of previous lab works. Follow-up [place] [facility].  Creatinine was 5.8 on presentation.  Kidney function improving with IV fluids.  Continue gentle IV fluids for today.    Symptomatic pyuria: No urinary symptoms.  UA showed 11-20 WBCs.  No indication for any treatment.  Chronic diastolic CHF: Echo on August 2023 showed EF of 60 to 60%, indeterminate diastolic parameters.  Currently she appears euvolemic.  Continue gentle IV fluid.  Home Lasix on hold  Hypertension: Currently normotensive.  Takes amlodipine at home  History of renal vein thrombosis: Continue home Eliquis  Anemia of chronic kidney disease: Currently hemoglobin stable      DVT prophylaxis:SCDs Start: 02/03/22 0224 apixaban (ELIQUIS) tablet 2.5 mg Start: 02/03/22 0015 apixaban (ELIQUIS) tablet 2.5 mg     Code Status: Full Code  Family Communication:  daughter on phone on 11/19  Patient status:Inpatient  Patient is from :Home  Anticipated discharge IW:LNLG  Estimated DC date:tomorrow   Consultants: None  Procedures:None  Antimicrobials:  Anti-infectives (From admission, onward)    Start     Dose/Rate Route Frequency Ordered Stop   02/02/22 2145  cephALEXin (KEFLEX) capsule 500 mg        500 mg Oral  Once 02/02/22 2132 02/02/22 2247       Subjective: Patient seen and examined at bedside today.  Hemodynamically stable.  Comfortable.  Ambulating inside the room.  Having good urine output without any complaints.  Objective: Vitals:   02/03/22 0615 02/03/22 1317 02/03/22 2128 02/04/22 0516  BP: (!) 151/87 (!) 120/50 (!) 151/63 (!) 181/104  Pulse: (!) 57 91 83 88  Resp: 18 17 17 17   Temp: 98.2 F (36.8 C) 98.4 F (36.9 C) 97.7 F (36.5 C) 98 F (36.7 C)  TempSrc: Oral Oral Oral Oral  SpO2: 90% 98% 95% 97%  Weight:        Intake/Output Summary (Last 24 hours) at 02/04/2022 0955 Last data filed at 02/03/2022 1500 Gross per 24 hour  Intake 588.8 ml  Output --  Net 588.8 ml   Filed Weights   02/03/22 0500  Weight: 67.7 kg    Examination:  General exam: Overall comfortable, not in distress HEENT: PERRL Respiratory system:  no wheezes or crackles  Cardiovascular system: S1 & S2 heard, RRR.  Gastrointestinal system: Abdomen is nondistended, soft and nontender. Central nervous system: Alert and oriented Extremities: No edema, no clubbing ,  no cyanosis Skin: No rashes, no ulcers,no icterus     Data Reviewed: I have personally reviewed following labs and imaging studies  CBC: Recent Labs  Lab 02/02/22 1351 02/03/22 0406  WBC 7.6 8.1  NEUTROABS 5.9 6.3  HGB 11.5* 10.8*  HCT 35.2* 35.5*  MCV 88.4 91.7  PLT 205 656   Basic Metabolic Panel: Recent Labs  Lab 02/02/22 1351 02/02/22 1932 02/03/22 0406 02/04/22 0346  NA 139  --  142 140  K 4.7  --  3.9 4.3  CL 102  --  107 107  CO2 26  --  25 25   GLUCOSE 90  --  110* 94  BUN 98*  --  108* 96*  CREATININE 5.35*  --  4.80* 4.00*  CALCIUM 9.8  --  9.1 9.2  MG  --  1.4* 1.9  --   PHOS  --   --  5.1*  --      No results found for this or any previous visit (from the past 240 hour(s)).   Radiology Studies: US RENAL  Result Date: 02/03/2022 CLINICAL DATA:  Acute renal insufficiency EXAM: RENAL / URINARY TRACT ULTRASOUND COMPLETE COMPARISON:  CT scan December 23, 2021 FINDINGS: Right Kidney: Renal measurements: 9.9 x 5.3 x 5.0 cm = volume: 137 mL. Two cysts are identified in the right kidney with the largest measuring 3 cm. No follow-up imaging recommended for the cyst. Left Kidney: Renal measurements: 10.4 x 5.2 x 4.7 cm = volume: 132.4 mL. Two cysts are identified in the left kidney with the largest measuring 2.4 cm. No follow-up imaging recommended for the cysts. Bladder: Appears normal for degree of bladder distention. Other: None. IMPRESSION: No significant abnormalities. No cause for acute renal insufficiency identified. Electronically Signed   By: Dorise Bullion III M.D.   On: 02/03/2022 09:25    Scheduled Meds:  amLODipine  5 mg Oral Daily   apixaban  2.5 mg Oral BID   budesonide  3 mg Oral Daily   famotidine  10 mg Oral QHS   FLUoxetine  20 mg Oral Daily   insulin aspart  0-6 Units Subcutaneous TID WC   lipase/protease/amylase  36,000 Units Oral TID WC   pantoprazole  40 mg Oral BID   Continuous Infusions:  lactated ringers 75 mL/hr at 02/04/22 0554     LOS: 1 day   Shelly Coss, MD [facility] P11/19/2023, 9:55 AM

## 2022-02-05 ENCOUNTER — Telehealth: Payer: Self-pay

## 2022-02-05 ENCOUNTER — Inpatient Hospital Stay (HOSPITAL_COMMUNITY): Payer: Medicare HMO

## 2022-02-05 ENCOUNTER — Other Ambulatory Visit: Payer: Self-pay

## 2022-02-05 DIAGNOSIS — N179 Acute kidney failure, unspecified: Secondary | ICD-10-CM | POA: Diagnosis not present

## 2022-02-05 DIAGNOSIS — N184 Chronic kidney disease, stage 4 (severe): Secondary | ICD-10-CM | POA: Diagnosis not present

## 2022-02-05 LAB — TROPONIN I (HIGH SENSITIVITY)
Troponin I (High Sensitivity): 20 ng/L — ABNORMAL HIGH (ref <18)
Troponin I (High Sensitivity): 20 ng/L — ABNORMAL HIGH (ref <18)

## 2022-02-05 LAB — BASIC METABOLIC PANEL
Anion gap: 8 (ref 5–15)
BUN: 78 mg/dL — ABNORMAL HIGH (ref 8–23)
CO2: 26 mmol/L (ref 22–32)
Calcium: 9.3 mg/dL (ref 8.9–10.3)
Chloride: 110 mmol/L (ref 98–111)
Creatinine, Ser: 3.01 mg/dL — ABNORMAL HIGH (ref 0.44–1.00)
GFR, Estimated: 16 mL/min — ABNORMAL LOW (ref >60)
Glucose, Bld: 91 mg/dL (ref 70–99)
Potassium: 4.5 mmol/L (ref 3.5–5.1)
Sodium: 144 mmol/L (ref 135–145)

## 2022-02-05 LAB — GLUCOSE, CAPILLARY
Glucose-Capillary: 95 mg/dL (ref 70–99)
Glucose-Capillary: 102 mg/dL — ABNORMAL HIGH (ref 70–99)
Glucose-Capillary: 157 mg/dL — ABNORMAL HIGH (ref 70–99)
Glucose-Capillary: 145 mg/dL — ABNORMAL HIGH (ref 70–99)
Glucose-Capillary: 125 mg/dL — ABNORMAL HIGH (ref 70–99)

## 2022-02-05 MED ORDER — LOPERAMIDE HCL 2 MG PO CAPS
2.0000 mg | ORAL_CAPSULE | Freq: Four times a day (QID) | ORAL | Status: DC | PRN
  Administered 2022-02-05: 2 mg via ORAL
  Filled 2022-02-05: qty 1

## 2022-02-05 MED ORDER — AMLODIPINE BESYLATE 10 MG PO TABS
10.0000 mg | ORAL_TABLET | Freq: Every day | ORAL | Status: DC
  Administered 2022-02-05 – 2022-02-06 (×2): 10 mg via ORAL
  Filled 2022-02-05 (×2): qty 1

## 2022-02-05 MED ORDER — HYDRALAZINE HCL 25 MG PO TABS
25.0000 mg | ORAL_TABLET | Freq: Three times a day (TID) | ORAL | Status: DC
  Administered 2022-02-05: 25 mg via ORAL
  Filled 2022-02-05: qty 1

## 2022-02-05 MED ORDER — HYDRALAZINE HCL 50 MG PO TABS
50.0000 mg | ORAL_TABLET | Freq: Three times a day (TID) | ORAL | Status: DC
  Administered 2022-02-05 – 2022-02-06 (×4): 50 mg via ORAL
  Filled 2022-02-05 (×4): qty 1

## 2022-02-05 MED ORDER — LABETALOL HCL 5 MG/ML IV SOLN
10.0000 mg | INTRAVENOUS | Status: DC | PRN
  Filled 2022-02-05: qty 4

## 2022-02-05 NOTE — Care Management Important Message (Signed)
Important Message  Patient Details IM Letter given. Name: [name] MRN: [id number] Date of Birth: 1946-03-13   Medicare Important Message Given:  Yes     Kerin Salen 02/05/2022, 12:35 PM

## 2022-02-05 NOTE — Telephone Encounter (Addendum)
Per Dr. Burr Medico made patient aware of message below    ----- Message from Truitt Merle, MD sent at 02/03/2022 10:20 AM EST ----- Please let pt know her iron level is below goal, and please schedule her for iv feraheme once in next 2-3 weeks (not on same day of her aranesp injection), thanks   Truitt Merle

## 2022-02-05 NOTE — Progress Notes (Signed)
PROGRESS NOTE  [name]  JTT:[id number] DOB: 22-Apr-1945 DOA: 02/02/2022 PCP: Seward Carol, MD   Brief Narrative:  76 year old with CKD stage IV with baseline creatinine about 2.5-3.5 and followed by outpatient nephrology who was in a routine visit to hematology clinic for aranesp and routine lab test showed creatinine of 5.8.  She was sent to the emergency room.  Admitted for further management of AKI on CKD stage IV.  Started on gentle IV fluids.  Kidney function improved with IV fluids and close to baseline now.  Blood pressure on the higher side.  Monitoring her for 1 more day  Assessment & Plan:  Principal Problem:   Acute renal failure superimposed on stage 4 chronic kidney disease (HCC) Active Problems:   Essential hypertension   History of anemia due to chronic kidney disease   Chronic diastolic CHF (congestive heart failure) (HCC)   DM2 (diabetes mellitus, type 2) (HCC)   History of renal vein thrombosis   Hypomagnesemia   Pyuria, sterile   AKI on CKD stage IV: Baseline creatinine is around 2.5-3.5 upon review of previous lab works. Follow-up [place] [facility].  Creatinine was 5.8 on presentation.  Kidney function improved with IV fluid.  Now back to baseline.  Fluid stopped  Symptomatic pyuria: No urinary symptoms.  UA showed 11-20 WBCs.  No indication for any treatment.  Chronic diastolic CHF: Echo on August 2023 showed EF of 60 to 60%, indeterminate diastolic parameters.  Currently she appears near euvolemic.  Home Lasix on hold,will resume on dc  Hypertension: Remains hypertensive today.  Amlodipine increased to 10 mg daily.  Started on hydralazine  History of renal vein thrombosis: Continue home Eliquis  Anemia of chronic kidney disease: Currently hemoglobin stable  Chest pain: Likely atypical chest pain.  Not associated with exertion.  Troponin very mildly elevated, will continue to monitor.  EKG without any ischemic changes.  Numbness of hands/dizziness:  No focal deficits, CT head without any acute findings.  Orthostasis: Her blood pressure slightly dropped when she stood.  But she remains hypertensive.  No indication for IV fluids       DVT prophylaxis:SCDs Start: 02/03/22 0224 apixaban (ELIQUIS) tablet 2.5 mg Start: 02/03/22 0015 apixaban (ELIQUIS) tablet 2.5 mg     Code Status: Full Code  Family Communication: daughter at bedside  Patient status:Inpatient  Patient is from :Home  Anticipated discharge ES:PQZR  Estimated DC date:tomorrow   Consultants: None  Procedures:None  Antimicrobials:  Anti-infectives (From admission, onward)    Start     Dose/Rate Route Frequency Ordered Stop   02/02/22 2145  cephALEXin (KEFLEX) capsule 500 mg        500 mg Oral  Once 02/02/22 2132 02/02/22 2247       Subjective: Patient seen and examined at bedside today.  She appears anxious today.  She remains hypertensive.  Complains of some dizziness.  She was also found to be slightly orthostatic when checked.  Denies any shortness of breath or cough.  Had some chest pain earlier this morning but it was resolved when I saw her at the bedside .  Objective: Vitals:   02/05/22 0729 02/05/22 0749 02/05/22 1037 02/05/22 1245  BP:  (!) 164/87 (!) 176/78 (!) 170/81  Pulse:    86  Resp:    16  Temp:    98.2 F (36.8 C)  TempSrc:    Oral  SpO2:    100%  Weight:      Height: 5' 1"  (1.549 m)  Intake/Output Summary (Last 24 hours) at 02/05/2022 1326 Last data filed at 02/05/2022 0912 Gross per 24 hour  Intake 1010.64 ml  Output --  Net 1010.64 ml   Filed Weights   02/03/22 0500 02/05/22 0500  Weight: 67.7 kg 68.5 kg    Examination:  General exam: Overall comfortable, not in distress,slightly anxious HEENT: PERRL Respiratory system:  no wheezes or crackles  Cardiovascular system: S1 & S2 heard, RRR.  Gastrointestinal system: Abdomen is nondistended, soft and nontender. Central nervous system: Alert and  oriented Extremities:mild LE edema, no clubbing ,no cyanosis Skin: No rashes, no ulcers,no icterus       Data Reviewed: I have personally reviewed following labs and imaging studies  CBC: Recent Labs  Lab 02/02/22 1351 02/03/22 0406  WBC 7.6 8.1  NEUTROABS 5.9 6.3  HGB 11.5* 10.8*  HCT 35.2* 35.5*  MCV 88.4 91.7  PLT 205 751   Basic Metabolic Panel: Recent Labs  Lab 02/02/22 1351 02/02/22 1932 02/03/22 0406 02/04/22 0346 02/05/22 0430  NA 139  --  142 140 144  K 4.7  --  3.9 4.3 4.5  CL 102  --  107 107 110  CO2 26  --  25 25 26   GLUCOSE 90  --  110* 94 91  BUN 98*  --  108* 96* 78*  CREATININE 5.35*  --  4.80* 4.00* 3.01*  CALCIUM 9.8  --  9.1 9.2 9.3  MG  --  1.4* 1.9  --   --   PHOS  --   --  5.1*  --   --      No results found for this or any previous visit (from the past 240 hour(s)).   Radiology Studies: CT HEAD WO CONTRAST (5MM)  Result Date: 02/05/2022 CLINICAL DATA:  Headache EXAM: CT HEAD WITHOUT CONTRAST TECHNIQUE: Contiguous axial images were obtained from the base of the skull through the vertex without intravenous contrast. RADIATION DOSE REDUCTION: This exam was performed according to the departmental dose-optimization program which includes automated exposure control, adjustment of the mA and/or kV according to patient size and/or use of iterative reconstruction technique. COMPARISON:  6/29/23CT Brain FINDINGS: Brain: No evidence of acute infarction, hemorrhage, hydrocephalus, extra-axial collection or mass lesion/mass effect. Chronic left caudate head lacunar infarct. Cavum septum pellucidum. Sequela of moderate chronic microvascular ischemic change. Advanced generalized volume. Vascular: No hyperdense vessel or unexpected calcification. Skull: Normal. Negative for fracture or focal lesion. Sinuses/Orbits: No acute finding. Other: None. IMPRESSION: No acute intracranial abnormality. Electronically Signed   By: Marin Roberts M.D.   On: 02/05/2022 11:09     Scheduled Meds:  amLODipine  10 mg Oral Daily   apixaban  2.5 mg Oral BID   budesonide  3 mg Oral Daily   famotidine  10 mg Oral QHS   FLUoxetine  20 mg Oral Daily   hydrALAZINE  50 mg Oral Q8H   insulin aspart  0-6 Units Subcutaneous TID WC   lipase/protease/amylase  36,000 Units Oral TID WC   pantoprazole  40 mg Oral BID   Continuous Infusions:     LOS: 2 days   Shelly Coss, MD [facility] P11/20/2023, 1:26 PM

## 2022-02-05 NOTE — Progress Notes (Signed)
At 6 am, Pt's BP was 191/82, no complaints of dizziness. Prn Hydralazine given via IV. Placed on high Fowler's position. Then pt complained of pain on her upper abdomen, PRN tramadol given. Blood sugar checked and it was 95.  At 6:30, pt complained of chest pain. EKG done (see chart), vital signs taken, BP 176/75, Pulse rate 100. On call NP notified. Endorsed to Morning RN.

## 2022-02-05 NOTE — Progress Notes (Signed)
Mobility Specialist - Progress Note   02/05/22 1441  Mobility  Activity Ambulated independently in hallway  Level of Assistance Independent  Assistive Device None  Distance Ambulated (ft) 350 ft  Range of Motion/Exercises Active  Activity Response Tolerated well  Mobility Referral Yes  $Mobility charge 1 Mobility   Pt received EOB and agreeable to mobility. No complaints during mobility. Pt to EOB after session with all needs met.      [facility]

## 2022-02-05 NOTE — Telephone Encounter (Signed)
Spoke with [name] Haw at the switchboard regarding Dr. Ernestina Penna fax from Friday about pt's Scr+.  [name] Haw confirmed receipt of the fax and it has been forwarded to Dr. Johnette Abraham. Hollie Salk for review.

## 2022-02-06 ENCOUNTER — Encounter: Payer: Self-pay | Admitting: Hematology

## 2022-02-06 ENCOUNTER — Other Ambulatory Visit (HOSPITAL_COMMUNITY): Payer: Self-pay

## 2022-02-06 DIAGNOSIS — N184 Chronic kidney disease, stage 4 (severe): Secondary | ICD-10-CM | POA: Diagnosis not present

## 2022-02-06 DIAGNOSIS — N179 Acute kidney failure, unspecified: Secondary | ICD-10-CM | POA: Diagnosis not present

## 2022-02-06 LAB — GLUCOSE, CAPILLARY
Glucose-Capillary: 97 mg/dL (ref 70–99)
Glucose-Capillary: 149 mg/dL — ABNORMAL HIGH (ref 70–99)

## 2022-02-06 LAB — BASIC METABOLIC PANEL
Anion gap: 9 (ref 5–15)
BUN: 69 mg/dL — ABNORMAL HIGH (ref 8–23)
CO2: 25 mmol/L (ref 22–32)
Calcium: 9.3 mg/dL (ref 8.9–10.3)
Chloride: 108 mmol/L (ref 98–111)
Creatinine, Ser: 2.69 mg/dL — ABNORMAL HIGH (ref 0.44–1.00)
GFR, Estimated: 18 mL/min — ABNORMAL LOW (ref >60)
Glucose, Bld: 89 mg/dL (ref 70–99)
Potassium: 3.8 mmol/L (ref 3.5–5.1)
Sodium: 142 mmol/L (ref 135–145)

## 2022-02-06 MED ORDER — AMLODIPINE BESYLATE 10 MG PO TABS
10.0000 mg | ORAL_TABLET | Freq: Every day | ORAL | 0 refills | Status: DC
  Filled 2022-02-06: qty 30, 30d supply, fill #0

## 2022-02-06 MED ORDER — HYDRALAZINE HCL 50 MG PO TABS
50.0000 mg | ORAL_TABLET | Freq: Three times a day (TID) | ORAL | 0 refills | Status: DC
  Filled 2022-02-06: qty 90, 30d supply, fill #0

## 2022-02-06 NOTE — Discharge Summary (Signed)
Physician Discharge Summary  [name] HLK:[id number] DOB: 03/25/1945 DOA: 02/02/2022  PCP: Seward Carol, MD  Admit date: 02/02/2022 Discharge date: 02/06/2022  Admitted From: Home Disposition:  Home  Discharge Condition:Stable CODE STATUS:FULL Diet recommendation: Heart Healthy   Brief/Interim Summary: 76 year old with CKD stage IV with baseline creatinine about 2.5-3.5 and followed by outpatient nephrology who was in a routine visit to hematology clinic for aranesp and routine lab test showed creatinine of 5.8.  She was sent to the emergency room.  Admitted for further management of AKI on CKD stage IV.  Started on gentle IV fluids.  Kidney function improved with IV fluids and close to baseline now. She is medically stable for discharge today.  Following problems were addressed during her hospitalization:  AKI on CKD stage IV: Baseline creatinine is around 2.5-3.5 upon review of previous lab works. Follow-up [place] [facility].  Creatinine was 5.8 on presentation.  Kidney function improved with IV fluid.  Now back to baseline.  Fluid stopped   Symptomatic pyuria: No urinary symptoms.  UA showed 11-20 WBCs.  No indication for any treatment.   Chronic diastolic CHF: Echo on August 2023 showed EF of 60 to 60%, indeterminate diastolic parameters.  Currently she appears near euvolemic.On lasix at home as needed   Hypertension:  Amlodipine increased to 10 mg daily.  Started on hydralazine.BP better   History of renal vein thrombosis: Continue home Eliquis   Anemia of chronic kidney disease: Currently hemoglobin stable   Chest pain: Likely atypical chest pain.  Not associated with exertion.  Troponin very mildly elevated.  EKG without any ischemic changes.  No further work-up indicated   Numbness of hands/dizziness: No focal deficits, CT head without any acute findings.  Most likely secondary to diabetic polyneuropathy   Orthostasis: Her blood pressure slightly dropped when she  stood yesterday.  But she bp on the higher side. Needs to be careful at home to prevent falls     Discharge Diagnoses:  Principal Problem:   Acute renal failure superimposed on stage 4 chronic kidney disease ([place]) Active Problems:   Essential hypertension   History of anemia due to chronic kidney disease   Chronic diastolic CHF (congestive heart failure) (HCC)   DM2 (diabetes mellitus, type 2) (HCC)   History of renal vein thrombosis   Hypomagnesemia   Pyuria, sterile    Discharge Instructions  Discharge Instructions     Diet - low sodium heart healthy   Complete by: As directed    Discharge instructions   Complete by: As directed    1)Please take prescribed medications as instructed 2)Follow up with your PCP and nephrologist as an outpatient 3)Monitor your blood pressure at home   Increase activity slowly   Complete by: As directed       Allergies as of 02/06/2022       Reactions   Percocet [oxycodone-acetaminophen] Other (See Comments)   headaches   Haloperidol Lactate Nausea And Vomiting   Lorazepam Nausea Only   Morphine Sulfate Other (See Comments)   Agitation   Propoxyphene Hcl Nausea And Vomiting   Tramadol Hcl Nausea And Vomiting   Valium [diazepam] Other (See Comments)   Agitation   Anesthetics, Halogenated Other (See Comments)   Per patient's daughter, she can not tolerate anesthetics.    Etomidate Other (See Comments)   Heart stopped during anesthesia   Fentanyl Other (See Comments)   Heart stopped during anesthesia   Haloperidol Nausea And Vomiting   Morphine Sulfate Nausea Only  Propoxyphene Nausea And Vomiting   Tramadol Hcl Nausea And Vomiting   Versed [midazolam] Other (See Comments)   Heart stopped during anesthesia        Medication List     TAKE these medications    acetaminophen 325 MG tablet Commonly known as: TYLENOL Take 2 tablets (650 mg total) by mouth every 6 (six) hours as needed for mild pain (or Fever >/= 101). What  changed: reasons to take this   amLODipine 10 MG tablet Commonly known as: NORVASC Take 1 tablet (10 mg total) by mouth daily. Start taking on: February 07, 2022 What changed: how much to take   apixaban 2.5 MG Tabs tablet Commonly known as: Eliquis Take 1 tablet (2.5 mg total) by mouth 2 (two) times daily.   budesonide 3 MG 24 hr capsule Commonly known as: ENTOCORT EC Take 1 capsule by mouth once daily   ergocalciferol 1.25 MG (50000 UT) capsule Commonly known as: VITAMIN D2 Take 1 capsule (50,000 Units total) by mouth once a week.   famotidine 20 MG tablet Commonly known as: PEPCID Take 10 mg by mouth at bedtime.   FLUoxetine 20 MG capsule Commonly known as: PROZAC Take 20 mg by mouth daily.   furosemide 40 MG tablet Commonly known as: Lasix Take 1 tablet (40 mg total) by mouth daily. What changed:  when to take this reasons to take this   hydrALAZINE 50 MG tablet Commonly known as: APRESOLINE Take 1 tablet (50 mg total) by mouth every 8 (eight) hours.   lipase/protease/amylase 36000 UNITS Cpep capsule Commonly known as: CREON Take 36,000 Units by mouth 3 (three) times daily with meals.   loperamide 2 MG tablet Commonly known as: IMODIUM A-D Take 4 mg by mouth 4 (four) times daily as needed for diarrhea or loose stools.   mirtazapine 7.5 MG tablet Commonly known as: REMERON Take 1 tablet (7.5 mg total) by mouth at bedtime.   nitroGLYCERIN 0.4 MG SL tablet Commonly known as: NITROSTAT DISSOLVE 1 TABLET UNDER THE TONGUE EVERY 5 MINUTES AS  NEEDED FOR CHEST PAIN. MAX  OF 3 TABLETS IN 15 MINUTES. CALL 911 IF PAIN PERSISTS. What changed: See the new instructions.   ondansetron 4 MG disintegrating tablet Commonly known as: ZOFRAN-ODT Take 1 tablet (4 mg total) by mouth every 8 (eight) hours as needed for nausea or vomiting.   pantoprazole 40 MG tablet Commonly known as: PROTONIX Take 1 tablet (40 mg total) by mouth 2 (two) times daily.   potassium chloride  20 MEQ packet Commonly known as: KLOR-CON Take 1 tablet (20 mEq) by mouth daily.        Follow-up Information     Seward Carol, MD. Schedule an appointment as soon as possible for a visit in 1 week(s).   Specialty: Internal Medicine Contact information: [address] [place] [address] [phone]                Allergies  Allergen Reactions   Percocet [Oxycodone-Acetaminophen] Other (See Comments)    headaches   Haloperidol Lactate Nausea And Vomiting   Lorazepam Nausea Only   Morphine Sulfate Other (See Comments)    Agitation   Propoxyphene Hcl Nausea And Vomiting   Tramadol Hcl Nausea And Vomiting   Valium [Diazepam] Other (See Comments)    Agitation    Anesthetics, Halogenated Other (See Comments)    Per patient's daughter, she can not tolerate anesthetics.    Etomidate Other (See Comments)  Heart stopped during anesthesia   Fentanyl Other (See Comments)    Heart stopped during anesthesia    Haloperidol Nausea And Vomiting   Morphine Sulfate Nausea Only   Propoxyphene Nausea And Vomiting   Tramadol Hcl Nausea And Vomiting   Versed [Midazolam] Other (See Comments)    Heart stopped during anesthesia    Consultations: None   Procedures/Studies: CT HEAD WO CONTRAST (5MM)  Result Date: 02/05/2022 CLINICAL DATA:  Headache EXAM: CT HEAD WITHOUT CONTRAST TECHNIQUE: Contiguous axial images were obtained from the base of the skull through the vertex without intravenous contrast. RADIATION DOSE REDUCTION: This exam was performed according to the departmental dose-optimization program which includes automated exposure control, adjustment of the mA and/or kV according to patient size and/or use of iterative reconstruction technique. COMPARISON:  6/29/23CT Brain FINDINGS: Brain: No evidence of acute infarction, hemorrhage, hydrocephalus, extra-axial collection or mass lesion/mass effect. Chronic left caudate head lacunar infarct. Cavum septum  pellucidum. Sequela of moderate chronic microvascular ischemic change. Advanced generalized volume. Vascular: No hyperdense vessel or unexpected calcification. Skull: Normal. Negative for fracture or focal lesion. Sinuses/Orbits: No acute finding. Other: None. IMPRESSION: No acute intracranial abnormality. Electronically Signed   By: Marin Roberts M.D.   On: 02/05/2022 11:09   US RENAL  Result Date: 02/03/2022 CLINICAL DATA:  Acute renal insufficiency EXAM: RENAL / URINARY TRACT ULTRASOUND COMPLETE COMPARISON:  CT scan December 23, 2021 FINDINGS: Right Kidney: Renal measurements: 9.9 x 5.3 x 5.0 cm = volume: 137 mL. Two cysts are identified in the right kidney with the largest measuring 3 cm. No follow-up imaging recommended for the cyst. Left Kidney: Renal measurements: 10.4 x 5.2 x 4.7 cm = volume: 132.4 mL. Two cysts are identified in the left kidney with the largest measuring 2.4 cm. No follow-up imaging recommended for the cysts. Bladder: Appears normal for degree of bladder distention. Other: None. IMPRESSION: No significant abnormalities. No cause for acute renal insufficiency identified. Electronically Signed   By: Dorise Bullion III M.D.   On: 02/03/2022 09:25      Subjective: Patient seen and examined at bedside today.  Hemodynamically stable.  Feels much better today.  Blood pressure better.  Denies any dizziness or lightheadedness today on standing.  Complains of some numbness in hands  Discharge Exam: Vitals:   02/06/22 0246 02/06/22 0433  BP: (!) 174/65 (!) 159/75  Pulse: 82   Resp: 20   Temp: 98.8 F (37.1 C)   SpO2: 100%    Vitals:   02/05/22 2126 02/06/22 0246 02/06/22 0433 02/06/22 0500  BP: (!) 141/63 (!) 174/65 (!) 159/75   Pulse: 83 82    Resp: 20 20    Temp: 98 F (36.7 C) 98.8 F (37.1 C)    TempSrc: Oral Oral    SpO2: 100% 100%    Weight:    68.7 kg  Height:        General: Pt is alert, awake, not in acute distress Cardiovascular: RRR, S1/S2 +, no rubs, no  gallops Respiratory: CTA bilaterally, no wheezing, no rhonchi Abdominal: Soft, NT, ND, bowel sounds + Extremities: no edema, no cyanosis    The results of significant diagnostics from this hospitalization (including imaging, microbiology, ancillary and laboratory) are listed below for reference.     Microbiology: No results found for this or any previous visit (from the past 240 hour(s)).   Labs: BNP (last 3 results) Recent Labs    12/25/21 1419 12/26/21 0602 02/03/22 0406  BNP 57.3 35.2 83.8  Basic Metabolic Panel: Recent Labs  Lab 02/02/22 1351 02/02/22 1932 02/03/22 0406 02/04/22 0346 02/05/22 0430 02/06/22 0406  NA 139  --  142 140 144 142  K 4.7  --  3.9 4.3 4.5 3.8  CL 102  --  107 107 110 108  CO2 26  --  25 25 26 25   GLUCOSE 90  --  110* 94 91 89  BUN 98*  --  108* 96* 78* 69*  CREATININE 5.35*  --  4.80* 4.00* 3.01* 2.69*  CALCIUM 9.8  --  9.1 9.2 9.3 9.3  MG  --  1.4* 1.9  --   --   --   PHOS  --   --  5.1*  --   --   --    Liver Function Tests: Recent Labs  Lab 02/02/22 1351 02/03/22 0406  AST 20 18  ALT 14 16  ALKPHOS 76 74  BILITOT 0.4 0.6  PROT 8.1 7.4  ALBUMIN 4.1 3.4*   No results for input(s): "LIPASE", "AMYLASE" in the last 168 hours. No results for input(s): "AMMONIA" in the last 168 hours. CBC: Recent Labs  Lab 02/02/22 1351 02/03/22 0406  WBC 7.6 8.1  NEUTROABS 5.9 6.3  HGB 11.5* 10.8*  HCT 35.2* 35.5*  MCV 88.4 91.7  PLT 205 204   Cardiac Enzymes: No results for input(s): "CKTOTAL", "CKMB", "CKMBINDEX", "TROPONINI" in the last 168 hours. BNP: Invalid input(s): "POCBNP" CBG: Recent Labs  Lab 02/05/22 0740 02/05/22 1205 02/05/22 1653 02/05/22 2124 02/06/22 0738  GLUCAP 102* 157* 145* 125* 97   D-Dimer No results for input(s): "DDIMER" in the last 72 hours. Hgb A1c No results for input(s): "HGBA1C" in the last 72 hours. Lipid Profile No results for input(s): "CHOL", "HDL", "LDLCALC", "TRIG", "CHOLHDL",  "LDLDIRECT" in the last 72 hours. Thyroid function studies No results for input(s): "TSH", "T4TOTAL", "T3FREE", "THYROIDAB" in the last 72 hours.  Invalid input(s): "FREET3" Anemia work up No results for input(s): "VITAMINB12", "FOLATE", "FERRITIN", "TIBC", "IRON", "RETICCTPCT" in the last 72 hours. Urinalysis    Component Value Date/Time   COLORURINE YELLOW 02/02/2022 1953   APPEARANCEUR CLOUDY (A) 02/02/2022 1953   APPEARANCEUR Clear 08/22/2018 1620   LABSPEC 1.013 02/02/2022 1953   LABSPEC 1.020 03/03/2014 1341   PHURINE 5.0 02/02/2022 1953   GLUCOSEU NEGATIVE 02/02/2022 1953   GLUCOSEU Negative 03/03/2014 1341   HGBUR NEGATIVE 02/02/2022 1953   BILIRUBINUR NEGATIVE 02/02/2022 1953   BILIRUBINUR Negative 08/22/2018 1620   BILIRUBINUR Negative 03/03/2014 1341   KETONESUR NEGATIVE 02/02/2022 1953   PROTEINUR 100 (A) 02/02/2022 1953   UROBILINOGEN 0.2 03/03/2014 1341   NITRITE NEGATIVE 02/02/2022 1953   LEUKOCYTESUR MODERATE (A) 02/02/2022 1953   LEUKOCYTESUR Negative 03/03/2014 1341   Sepsis Labs Recent Labs  Lab 02/02/22 1351 02/03/22 0406  WBC 7.6 8.1   Microbiology No results found for this or any previous visit (from the past 240 hour(s)).  Please note: You were cared for by a hospitalist during your hospital stay. Once you are discharged, your primary care physician will handle any further medical issues. Please note that NO REFILLS for any discharge medications will be authorized once you are discharged, as it is imperative that you return to your primary care physician (or establish a relationship with a primary care physician if you do not have one) for your post hospital discharge needs so that they can reassess your need for medications and monitor your lab values.    Time coordinating discharge: 40 minutes  SIGNED:   Shelly Coss, MD  [facility] 02/06/2022, 11:05 AM Pager [phone]  If 7PM-7AM, please contact  night-coverage [email or web address] Password [email or web address]

## 2022-02-06 NOTE — Progress Notes (Signed)
Mobility Specialist - Progress Note   02/06/22 0954  Mobility  Activity Ambulated independently in hallway  Level of Assistance Independent  Assistive Device None  Distance Ambulated (ft) 350 ft  Activity Response Tolerated well  Mobility Referral Yes  $Mobility charge 1 Mobility   Pt received in room standing and agreeable to mobility. No complaints during mobility.  Pt to EOB for breakfast after session with all needs met.    [facility]

## 2022-02-14 DIAGNOSIS — N184 Chronic kidney disease, stage 4 (severe): Secondary | ICD-10-CM | POA: Diagnosis not present

## 2022-02-14 DIAGNOSIS — N179 Acute kidney failure, unspecified: Secondary | ICD-10-CM | POA: Diagnosis not present

## 2022-02-14 DIAGNOSIS — Z23 Encounter for immunization: Secondary | ICD-10-CM | POA: Diagnosis not present

## 2022-02-14 DIAGNOSIS — R7989 Other specified abnormal findings of blood chemistry: Secondary | ICD-10-CM | POA: Diagnosis not present

## 2022-02-16 ENCOUNTER — Other Ambulatory Visit: Payer: Self-pay | Admitting: Hematology

## 2022-02-16 ENCOUNTER — Inpatient Hospital Stay: Payer: Medicare HMO

## 2022-02-16 ENCOUNTER — Telehealth: Payer: Self-pay | Admitting: Medical Oncology

## 2022-02-16 ENCOUNTER — Inpatient Hospital Stay: Payer: Medicare HMO | Attending: Hematology

## 2022-02-16 ENCOUNTER — Other Ambulatory Visit: Payer: Self-pay

## 2022-02-16 ENCOUNTER — Telehealth: Payer: Self-pay

## 2022-02-16 VITALS — BP 137/53 | HR 82 | Temp 98.9°F | Resp 18

## 2022-02-16 DIAGNOSIS — N184 Chronic kidney disease, stage 4 (severe): Secondary | ICD-10-CM | POA: Diagnosis not present

## 2022-02-16 DIAGNOSIS — D631 Anemia in chronic kidney disease: Secondary | ICD-10-CM | POA: Insufficient documentation

## 2022-02-16 DIAGNOSIS — D464 Refractory anemia, unspecified: Secondary | ICD-10-CM

## 2022-02-16 DIAGNOSIS — I129 Hypertensive chronic kidney disease with stage 1 through stage 4 chronic kidney disease, or unspecified chronic kidney disease: Secondary | ICD-10-CM | POA: Diagnosis not present

## 2022-02-16 DIAGNOSIS — D509 Iron deficiency anemia, unspecified: Secondary | ICD-10-CM

## 2022-02-16 DIAGNOSIS — E1122 Type 2 diabetes mellitus with diabetic chronic kidney disease: Secondary | ICD-10-CM | POA: Diagnosis not present

## 2022-02-16 DIAGNOSIS — N2581 Secondary hyperparathyroidism of renal origin: Secondary | ICD-10-CM | POA: Diagnosis not present

## 2022-02-16 DIAGNOSIS — I82451 Acute embolism and thrombosis of right peroneal vein: Secondary | ICD-10-CM | POA: Diagnosis not present

## 2022-02-16 LAB — CBC WITH DIFFERENTIAL (CANCER CENTER ONLY)
Abs Immature Granulocytes: 0.08 10*3/uL — ABNORMAL HIGH (ref 0.00–0.07)
Basophils Absolute: 0 10*3/uL (ref 0.0–0.1)
Basophils Relative: 0 %
Eosinophils Absolute: 0 10*3/uL (ref 0.0–0.5)
Eosinophils Relative: 0 %
HCT: 32.4 % — ABNORMAL LOW (ref 36.0–46.0)
Hemoglobin: 10.2 g/dL — ABNORMAL LOW (ref 12.0–15.0)
Immature Granulocytes: 1 %
Lymphocytes Relative: 15 %
Lymphs Abs: 1.5 10*3/uL (ref 0.7–4.0)
MCH: 28.4 pg (ref 26.0–34.0)
MCHC: 31.5 g/dL (ref 30.0–36.0)
MCV: 90.3 fL (ref 80.0–100.0)
Monocytes Absolute: 0.7 10*3/uL (ref 0.1–1.0)
Monocytes Relative: 6 %
Neutro Abs: 8.2 10*3/uL — ABNORMAL HIGH (ref 1.7–7.7)
Neutrophils Relative %: 78 %
Platelet Count: 250 10*3/uL (ref 150–400)
RBC: 3.59 MIL/uL — ABNORMAL LOW (ref 3.87–5.11)
RDW: 15.6 % — ABNORMAL HIGH (ref 11.5–15.5)
WBC Count: 10.6 10*3/uL — ABNORMAL HIGH (ref 4.0–10.5)
nRBC: 0 % (ref 0.0–0.2)

## 2022-02-16 LAB — CMP (CANCER CENTER ONLY)
ALT: 18 U/L (ref 0–44)
AST: 15 U/L (ref 15–41)
Albumin: 4.1 g/dL (ref 3.5–5.0)
Alkaline Phosphatase: 86 U/L (ref 38–126)
Anion gap: 10 (ref 5–15)
BUN: 92 mg/dL — ABNORMAL HIGH (ref 8–23)
CO2: 28 mmol/L (ref 22–32)
Calcium: 9.5 mg/dL (ref 8.9–10.3)
Chloride: 101 mmol/L (ref 98–111)
Creatinine: 3.73 mg/dL (ref 0.44–1.00)
GFR, Estimated: 12 mL/min — ABNORMAL LOW (ref >60)
Glucose, Bld: 118 mg/dL — ABNORMAL HIGH (ref 70–99)
Potassium: 3.9 mmol/L (ref 3.5–5.1)
Sodium: 139 mmol/L (ref 135–145)
Total Bilirubin: 0.3 mg/dL (ref 0.3–1.2)
Total Protein: 8 g/dL (ref 6.5–8.1)

## 2022-02-16 LAB — SAMPLE TO BLOOD BANK

## 2022-02-16 LAB — FERRITIN: Ferritin: 102 ng/mL (ref 11–307)

## 2022-02-16 MED ORDER — DARBEPOETIN ALFA 200 MCG/0.4ML IJ SOSY
200.0000 ug | PREFILLED_SYRINGE | Freq: Once | INTRAMUSCULAR | Status: AC
  Administered 2022-02-16: 200 ug via SUBCUTANEOUS
  Filled 2022-02-16: qty 0.4

## 2022-02-16 NOTE — Telephone Encounter (Signed)
CRITICAL VALUE STICKER  CRITICAL VALUE:3.73 Creatinine  RECEIVER (on-site recipient of call):Aaryanna Hyden Landen [phone]  [place] (representative from lab):Deidre Ala  MD NOTIFIED: Dr. Burr Medico  TIME OF NOTIFICATION:1422  RESPONSE: Notified Dr. Burr Medico

## 2022-02-16 NOTE — Telephone Encounter (Signed)
IV iron switched to venofer.  LVM that if she needs iv iron in future, she will get venofer 469m, not feraheme. This is her insurance preferred iron.

## 2022-02-18 DIAGNOSIS — D519 Vitamin B12 deficiency anemia, unspecified: Secondary | ICD-10-CM | POA: Insufficient documentation

## 2022-02-18 DIAGNOSIS — D649 Anemia, unspecified: Secondary | ICD-10-CM | POA: Insufficient documentation

## 2022-02-18 NOTE — Assessment & Plan Note (Deleted)
--  diagnosed 02/2013, s/p right lumpectomy, adjuvant radiation and Anastrozole for 4 years until 07/2017.  Anastrozole was stopped after she developed septic shock from pneumonia and a cardiac arrest, and we decided not restart due to the risk of cardiac toxicity.  -Continue annual screening mammogram, no evidence of recurrence so far.

## 2022-02-18 NOTE — Assessment & Plan Note (Deleted)
-  secondary to small bowel resection  -on B12 IM monthly

## 2022-02-18 NOTE — Assessment & Plan Note (Deleted)
-  she has anemia of chronic disease from CKD -on EPO, started Retacrit every 2 weeks on 11/14/18. Goal is Hg 9-11. Due to worsened anemia, her dose has been increased. She is on Aranesp 250mg every 2 weeks now

## 2022-02-18 NOTE — Assessment & Plan Note (Deleted)
-  on iv feraheme as needed

## 2022-02-19 ENCOUNTER — Inpatient Hospital Stay: Payer: Medicare HMO

## 2022-02-19 ENCOUNTER — Ambulatory Visit: Payer: Medicare HMO

## 2022-02-19 ENCOUNTER — Inpatient Hospital Stay: Payer: Medicare HMO | Admitting: Hematology

## 2022-02-19 ENCOUNTER — Other Ambulatory Visit: Payer: Self-pay

## 2022-02-19 ENCOUNTER — Telehealth: Payer: Self-pay

## 2022-02-19 DIAGNOSIS — D518 Other vitamin B12 deficiency anemias: Secondary | ICD-10-CM

## 2022-02-19 DIAGNOSIS — R7989 Other specified abnormal findings of blood chemistry: Secondary | ICD-10-CM | POA: Diagnosis not present

## 2022-02-19 DIAGNOSIS — D509 Iron deficiency anemia, unspecified: Secondary | ICD-10-CM

## 2022-02-19 DIAGNOSIS — D631 Anemia in chronic kidney disease: Secondary | ICD-10-CM

## 2022-02-19 DIAGNOSIS — C50311 Malignant neoplasm of lower-inner quadrant of right female breast: Secondary | ICD-10-CM

## 2022-02-19 NOTE — Progress Notes (Deleted)
[place]   [phone] Fax:([phone]) [phone]   Clinic Follow up Note   Patient Care Team: Seward Carol, MD as PCP - General (Internal Medicine) Josue Hector, MD as PCP - Cardiology (Cardiology) Thelma Comp, Altha as Consulting Physician (Optometry) Stark Klein, MD as Consulting Physician (General Surgery) Truitt Merle, MD as Consulting Physician (Hematology) Madelon Lips, MD as Consulting Physician (Nephrology)  Date of Service:  02/19/2022  CHIEF COMPLAINT: f/u of  anemia of chronic disease, h/o right breast cancer    CURRENT THERAPY:   -Retacrit injection starting 09/2018, currently q2weeks -B12 injection starting 05/04/19, currently q6weeks -Oral Ferrous sulfate daily  -IV Feraheme as needed starting 09/15/18. Last on 12/21/19    ASSESSMENT: *** [name] is a 76 y.o. female with   No problem-specific Assessment & Plan notes found for this encounter.  ***   PLAN:  SUMMARY OF ONCOLOGIC HISTORY: Oncology History Overview Note  Cancer Staging Breast cancer of lower-inner quadrant of right female breast ([place]) Staging form: Breast, AJCC 7th Edition - Clinical: Stage IA (T1b, N0, cM0) - Unsigned Staging comments: Staged at breast conference 03/25/13.  - Pathologic stage from 05/12/2013: Stage IA (T1b, N0, cM0) - Signed by Truitt Merle, MD on 06/21/2016     Breast cancer of lower-inner quadrant of right female breast ([place])  02/27/2013 Mammogram   Diagnostic mammogram and ultrasound showed a 6 x 5 mm mass at 5:00 position. Axilla was negative by ultrasound.   03/16/2013 Initial Biopsy   Right breast 5:00 position biopsy showed invasive ductal carcinoma, low-grade   03/16/2013 Receptors her2   ER100% positive, PR 100% positive, HER-2 negative, Ki-67 20%   03/18/2013 Initial Diagnosis   Breast cancer of lower-inner quadrant of right female breast ([place])   05/12/2013 Surgery   Right breast lumpectomy with sentinel lymph node  biopsy   05/12/2013 Pathology Results   Right breast lumpectomy showed invasive ductal carcinoma, grade 2, 0.9 cm, 6 sentinel lymph nodes were all negative. Margins were negative.   05/12/2013 Oncotype testing   Oncotype recurrence score 19, intermediate risk, 10 year risk of distant recurrence 12% with tamoxifen for 5 years. Patient declined adjuvant chemotherapy.   06/02/2013 - 07/16/2013 Radiation Therapy   Adjuvant breast radiation with boost 1600Gy   08/06/2013 - 07/2017 Anti-estrogen oral therapy   Arimidex 1 mg daily. Stopped in 07/2017 due to acute illness    03/22/2017 Mammogram   IMPRESSION: No mammographic evidence for malignancy.   Iron deficiency anemia     INTERVAL HISTORY: *** [name] is here for a follow up of anemia of chronic disease, h/o right breast cancer  She was last seen by Debbe Odea on [phone] She presents to the clinic      All other systems were reviewed with the patient and are negative.  MEDICAL HISTORY:  Past Medical History:  Diagnosis Date   Abdominal discomfort    Chronic N/V/D. Presumptive dx Crohn's dx per elevated p ANCA. Failed Entocort and Pentasa. Sep 2003 - ileocolectomy c anastomosis per Dr Deon Pilling 2/2 adhesions - path was hegative for Crohns. EGD, Sm bowel follow through (11/03), and an eteroclysis (10/03) were unrevealing. Cuases hypomag and hypocalcemia.   Adnexal mass 8/03   s/p lap BSO (R ovarian fibroma) & lysis of adhesions   Allergy    Seasonal   Anemia    Multifactorial. Baseline HgB 10-11 ish. B12 def - 150 in 3/10. Fe Def - ferritin 35 3/10. Both are being repleted.  Breast cancer ([place]) 03/16/13   right, 5 o'clock   CAD (coronary artery disease) 1996   1996 - PTCA and angioplasty diagonal branch. 2000 - Rotoblator & angiopllasty of diagonal. 2006 - subendocardial AMI, DES to proximal LAD.Marland Kitchen Also had 90% stenosis in distal apical LAD. EF 55 with apical hypokinesis. Indefinite ASA and Plavix.   CHF (congestive heart  failure) (HCC)    Chronic kidney disease    Chronic renal insuff baseline Cr 1.2 - 1.4 ish.   Chronic kidney disease, stage III (moderate) (HCC)    Chronic renal insuff baseline Cr 1.2 - 1.4 ish. New baseline about 2 (2018)   Chronic pain    CT 10/10 = Spinal stenosis L2 - S1.   COVID-19 11/02/2020   Diabetes mellitus    Insulin dependent   Diabetes mellitus type 2 in obese ([place]) 05/03/2010   Managed on lantus and novolog. Has diabetic nephropathy. Metformin D/C'd 2012 2/2 creatinine.  No diabetic retinopathy per 3/11.    Gout    Hx of radiation therapy 06/02/13- 07/16/13   right rbeast 4500 cGy 25 sessions, right breast boost 1600 cGy in 8 sessions   Hyperlipidemia    Managed with both a statin and Welchol. Welchol stopped 2014 2/2 cost and started on fenofibrate    Hypertension    2006 B renal arteries patent. 2003 MRA - no RAS. 2003 pheo W/U Dr [facility] Done reportedly negative.   Hypoxia 07/23/2017   Lupus ([place])    Lymphedema of breast    Personal history of radiation therapy 2015   Pneumonia of left lung due to infectious organism    RBBB    Renal vein thrombosis (HCC)    SBO (small bowel obstruction) ([place]) 09/17/2017   Secondary hyperparathyroidism ([place])    Stroke ([place])    Incidental finding MRI 2002 L lacunar infarct   Vitamin B12 deficiency    Vitamin D deficiency    Wears dentures    top    SURGICAL HISTORY: Past Surgical History:  Procedure Laterality Date   ABDOMINAL HYSTERECTOMY     BILATERAL SALPINGOOPHORECTOMY  8/03   Lap BSO (R ovarian fibroma) and adhesion lysis   BIOPSY  09/08/2020   Procedure: BIOPSY;  Surgeon: Ronald Lobo, MD;  Location: [facility];  Service: Endoscopy;;   BOWEL RESECTION  2003   ileocolectomy with anastomosis 2/2 adhesions   BREAST BIOPSY Right 2015   BREAST BIOPSY Right 07/2014   BREAST LUMPECTOMY Right 04/22/2013   BREAST LUMPECTOMY WITH NEEDLE LOCALIZATION AND AXILLARY SENTINEL LYMPH NODE BX Right 04/22/2013   Procedure: BREAST LUMPECTOMY  WITH NEEDLE LOCALIZATION AND AXILLARY SENTINEL LYMPH NODE BX;  Surgeon: Stark Klein, MD;  Location: [facility];  Service: General;  Laterality: Right;   CARDIAC CATHETERIZATION     2 stents   CHOLECYSTECTOMY     COLONOSCOPY     ESOPHAGOGASTRODUODENOSCOPY N/A 09/08/2020   Procedure: ESOPHAGOGASTRODUODENOSCOPY (EGD);  Surgeon: Ronald Lobo, MD;  Location: [facility];  Service: Endoscopy;  Laterality: N/A;   HEMICOLECTOMY     R sided hemicolectomy   HERNIA REPAIR     Ventral hernia repair   PTCA  4/06    I have reviewed the social history and family history with the patient and they are unchanged from previous note.  ALLERGIES:  is allergic to percocet [oxycodone-acetaminophen]; haloperidol lactate; lorazepam; morphine sulfate; propoxyphene hcl; tramadol hcl; valium [diazepam]; anesthetics, halogenated; etomidate; fentanyl; haloperidol; morphine sulfate; propoxyphene; tramadol hcl; and versed [midazolam].  MEDICATIONS:  Current Outpatient Medications  Medication Sig Dispense Refill   acetaminophen (TYLENOL) 325 MG tablet Take 2 tablets (650 mg total) by mouth every 6 (six) hours as needed for mild pain (or Fever >/= 101). (Patient taking differently: Take 650 mg by mouth every 6 (six) hours as needed for mild pain.)     amLODipine (NORVASC) 10 MG tablet Take 1 tablet (10 mg total) by mouth daily. 30 tablet 0   apixaban (ELIQUIS) 2.5 MG TABS tablet Take 1 tablet (2.5 mg total) by mouth 2 (two) times daily. 60 tablet 0   budesonide (ENTOCORT EC) 3 MG 24 hr capsule Take 1 capsule by mouth once daily (Patient taking differently: Take 3 mg by mouth daily.) 30 capsule 0   ergocalciferol (VITAMIN D2) 1.25 MG (50000 UT) capsule Take 1 capsule (50,000 Units total) by mouth once a week. 4 capsule 1   famotidine (PEPCID) 20 MG tablet Take 10 mg by mouth at bedtime.     FLUoxetine (PROZAC) 20 MG capsule Take 20 mg by mouth daily.     furosemide (LASIX) 40 MG tablet Take 1 tablet (40  mg total) by mouth daily. (Patient taking differently: Take 40 mg by mouth daily as needed for fluid.) 30 tablet 0   hydrALAZINE (APRESOLINE) 50 MG tablet Take 1 tablet (50 mg total) by mouth every 8 (eight) hours. 90 tablet 0   lipase/protease/amylase (CREON) 36000 UNITS CPEP capsule Take 36,000 Units by mouth 3 (three) times daily with meals.     loperamide (IMODIUM A-D) 2 MG tablet Take 4 mg by mouth 4 (four) times daily as needed for diarrhea or loose stools.     mirtazapine (REMERON) 7.5 MG tablet Take 1 tablet (7.5 mg total) by mouth at bedtime. 90 tablet 1   nitroGLYCERIN (NITROSTAT) 0.4 MG SL tablet DISSOLVE 1 TABLET UNDER THE TONGUE EVERY 5 MINUTES AS  NEEDED FOR CHEST PAIN. MAX  OF 3 TABLETS IN 15 MINUTES. CALL 911 IF PAIN PERSISTS. (Patient taking differently: Place 0.4 mg under the tongue every 5 (five) minutes as needed for chest pain.) 25 tablet 0   ondansetron (ZOFRAN-ODT) 4 MG disintegrating tablet Take 1 tablet (4 mg total) by mouth every 8 (eight) hours as needed for nausea or vomiting. 20 tablet 0   pantoprazole (PROTONIX) 40 MG tablet Take 1 tablet (40 mg total) by mouth 2 (two) times daily. 30 tablet 0   potassium chloride (KLOR-CON) 20 MEQ packet Take 1 tablet (20 mEq) by mouth daily. 30 packet 0   No current facility-administered medications for this visit.    PHYSICAL EXAMINATION: ECOG PERFORMANCE STATUS: {CHL ONC ECOG PS:[id number]}  There were no vitals filed for this visit. Wt Readings from Last 3 Encounters:  02/06/22 151 lb 7.3 oz (68.7 kg)  12/26/21 151 lb 9.1 oz (68.8 kg)  12/23/21 151 lb 9.1 oz (68.8 kg)    {Only keep what was examined. If exam not performed, can use .CEXAM } GENERAL:alert, no distress and comfortable SKIN: skin color, texture, turgor are normal, no rashes or significant lesions EYES: normal, Conjunctiva are pink and non-injected, sclera clear {OROPHARYNX:no exudate, no erythema and lips, buccal mucosa, and tongue normal}  NECK: supple,  thyroid normal size, non-tender, without nodularity LYMPH:  no palpable lymphadenopathy in the cervical, axillary {or inguinal} LUNGS: clear to auscultation and percussion with normal breathing effort HEART: regular rate & rhythm and no murmurs and no lower extremity edema ABDOMEN:abdomen soft, non-tender and normal bowel sounds Musculoskeletal:no cyanosis of digits and no clubbing  NEURO: alert &  oriented x 3 with fluent speech, no focal motor/sensory deficits  LABORATORY DATA:  I have reviewed the data as listed    Latest Ref Rng & Units 02/16/2022    1:21 PM 02/03/2022    4:06 AM 02/02/2022    1:51 PM  CBC  WBC 4.0 - 10.5 K/uL 10.6  8.1  7.6   Hemoglobin 12.0 - 15.0 g/dL 10.2  10.8  11.5   Hematocrit 36.0 - 46.0 % 32.4  35.5  35.2   Platelets 150 - 400 K/uL 250  204  205         Latest Ref Rng & Units 02/16/2022    1:21 PM 02/06/2022    4:06 AM 02/05/2022    4:30 AM  CMP  Glucose 70 - 99 mg/dL 118  89  91   BUN 8 - 23 mg/dL 92  69  78   Creatinine 0.44 - 1.00 mg/dL 3.73  2.69  3.01   Sodium 135 - 145 mmol/L 139  142  144   Potassium 3.5 - 5.1 mmol/L 3.9  3.8  4.5   Chloride 98 - 111 mmol/L 101  108  110   CO2 22 - 32 mmol/L _0 Calcium 8.9 - 10.3 mg/dL 9.5  9.3  9.3   Total Protein 6.5 - 8.1 g/dL 8.0     Total Bilirubin 0.3 - 1.2 mg/dL 0.3     Alkaline Phos 38 - 126 U/L 86     AST 15 - 41 U/L 15     ALT 0 - 44 U/L 18         RADIOGRAPHIC STUDIES: I have personally reviewed the radiological images as listed and agreed with the findings in the report. No results found.    No orders of the defined types were placed in this encounter.  All questions were answered. The patient knows to call the clinic with any problems, questions or concerns. No barriers to learning was detected. The total time spent in the appointment was {CHL ONC TIME VISIT - [id number]}.     Baldemar Friday, CMA 02/19/2022   I, Audry Riles, CMA, am acting as scribe for  Truitt Merle, MD.   {Add scribe attestation statement}

## 2022-02-19 NOTE — Patient Outreach (Signed)
Received a referral from Ina Homes from [facility] Team for medication management and  meals- mom's meals.   Ms. Hoban Primary Care Physician is using [facility] services.  I have sent an email referral [employer].   [facility], [place], [place] [facility] [phone]

## 2022-02-21 DIAGNOSIS — I10 Essential (primary) hypertension: Secondary | ICD-10-CM | POA: Diagnosis not present

## 2022-02-21 DIAGNOSIS — N184 Chronic kidney disease, stage 4 (severe): Secondary | ICD-10-CM | POA: Diagnosis not present

## 2022-02-21 DIAGNOSIS — I5033 Acute on chronic diastolic (congestive) heart failure: Secondary | ICD-10-CM | POA: Diagnosis not present

## 2022-02-21 DIAGNOSIS — E7800 Pure hypercholesterolemia, unspecified: Secondary | ICD-10-CM | POA: Diagnosis not present

## 2022-02-21 DIAGNOSIS — E1169 Type 2 diabetes mellitus with other specified complication: Secondary | ICD-10-CM | POA: Diagnosis not present

## 2022-03-08 ENCOUNTER — Other Ambulatory Visit (HOSPITAL_COMMUNITY): Payer: Self-pay

## 2022-03-22 ENCOUNTER — Other Ambulatory Visit: Payer: Self-pay | Admitting: Physician Assistant

## 2022-03-24 NOTE — Telephone Encounter (Signed)
Per Dr. Burr Medico, pt will need to contact her PCP to refill her Lasix.

## 2022-03-26 ENCOUNTER — Other Ambulatory Visit (HOSPITAL_COMMUNITY): Payer: Self-pay

## 2022-03-28 ENCOUNTER — Other Ambulatory Visit (HOSPITAL_BASED_OUTPATIENT_CLINIC_OR_DEPARTMENT_OTHER): Payer: Self-pay

## 2022-03-28 ENCOUNTER — Other Ambulatory Visit (HOSPITAL_COMMUNITY): Payer: Self-pay

## 2022-03-28 MED ORDER — FUROSEMIDE 40 MG PO TABS
40.0000 mg | ORAL_TABLET | Freq: Every day | ORAL | 3 refills | Status: DC
  Filled 2022-03-28 – 2022-04-05 (×2): qty 90, 90d supply, fill #0
  Filled 2022-07-08: qty 90, 90d supply, fill #1
  Filled 2022-10-27: qty 90, 90d supply, fill #2
  Filled 2023-02-02: qty 90, 90d supply, fill #3

## 2022-03-28 MED ORDER — AMLODIPINE BESYLATE 10 MG PO TABS
10.0000 mg | ORAL_TABLET | Freq: Every day | ORAL | 1 refills | Status: DC
  Filled 2022-03-28 – 2022-04-13 (×2): qty 90, 90d supply, fill #0
  Filled 2022-07-08: qty 90, 90d supply, fill #1

## 2022-04-05 ENCOUNTER — Other Ambulatory Visit: Payer: Self-pay

## 2022-04-05 ENCOUNTER — Other Ambulatory Visit (HOSPITAL_COMMUNITY): Payer: Self-pay

## 2022-04-06 ENCOUNTER — Telehealth: Payer: Self-pay | Admitting: Hematology

## 2022-04-06 NOTE — Telephone Encounter (Signed)
Per 1/19 IB, pt has been called and confirmed  

## 2022-04-07 ENCOUNTER — Other Ambulatory Visit (HOSPITAL_COMMUNITY): Payer: Self-pay

## 2022-04-09 ENCOUNTER — Other Ambulatory Visit: Payer: Self-pay

## 2022-04-10 ENCOUNTER — Other Ambulatory Visit: Payer: Self-pay | Admitting: Physician Assistant

## 2022-04-11 ENCOUNTER — Other Ambulatory Visit (HOSPITAL_COMMUNITY): Payer: Self-pay

## 2022-04-11 ENCOUNTER — Other Ambulatory Visit: Payer: Self-pay

## 2022-04-13 ENCOUNTER — Other Ambulatory Visit (HOSPITAL_COMMUNITY): Payer: Self-pay

## 2022-04-18 ENCOUNTER — Other Ambulatory Visit: Payer: Self-pay

## 2022-04-18 ENCOUNTER — Telehealth: Payer: Self-pay | Admitting: Hematology

## 2022-04-18 DIAGNOSIS — D464 Refractory anemia, unspecified: Secondary | ICD-10-CM

## 2022-04-18 DIAGNOSIS — C50311 Malignant neoplasm of lower-inner quadrant of right female breast: Secondary | ICD-10-CM

## 2022-04-18 DIAGNOSIS — E538 Deficiency of other specified B group vitamins: Secondary | ICD-10-CM

## 2022-04-18 DIAGNOSIS — D509 Iron deficiency anemia, unspecified: Secondary | ICD-10-CM

## 2022-04-18 DIAGNOSIS — D631 Anemia in chronic kidney disease: Secondary | ICD-10-CM

## 2022-04-18 NOTE — Telephone Encounter (Signed)
Per secure chat

## 2022-04-21 ENCOUNTER — Encounter: Payer: Self-pay | Admitting: Hematology

## 2022-04-24 ENCOUNTER — Other Ambulatory Visit: Payer: Self-pay | Admitting: Hematology

## 2022-04-25 DIAGNOSIS — I129 Hypertensive chronic kidney disease with stage 1 through stage 4 chronic kidney disease, or unspecified chronic kidney disease: Secondary | ICD-10-CM | POA: Diagnosis not present

## 2022-04-25 DIAGNOSIS — N390 Urinary tract infection, site not specified: Secondary | ICD-10-CM | POA: Diagnosis not present

## 2022-04-25 DIAGNOSIS — D631 Anemia in chronic kidney disease: Secondary | ICD-10-CM | POA: Diagnosis not present

## 2022-04-25 DIAGNOSIS — N184 Chronic kidney disease, stage 4 (severe): Secondary | ICD-10-CM | POA: Diagnosis not present

## 2022-04-25 DIAGNOSIS — N2581 Secondary hyperparathyroidism of renal origin: Secondary | ICD-10-CM | POA: Diagnosis not present

## 2022-04-25 DIAGNOSIS — E1122 Type 2 diabetes mellitus with diabetic chronic kidney disease: Secondary | ICD-10-CM | POA: Diagnosis not present

## 2022-04-25 DIAGNOSIS — N189 Chronic kidney disease, unspecified: Secondary | ICD-10-CM | POA: Diagnosis not present

## 2022-04-26 ENCOUNTER — Inpatient Hospital Stay: Payer: Medicare PPO

## 2022-04-26 ENCOUNTER — Inpatient Hospital Stay (HOSPITAL_BASED_OUTPATIENT_CLINIC_OR_DEPARTMENT_OTHER): Payer: Medicare PPO | Admitting: Hematology

## 2022-04-26 ENCOUNTER — Inpatient Hospital Stay: Payer: Medicare PPO | Attending: Hematology

## 2022-04-26 ENCOUNTER — Other Ambulatory Visit: Payer: Self-pay | Admitting: Hematology

## 2022-04-26 ENCOUNTER — Other Ambulatory Visit: Payer: Self-pay

## 2022-04-26 VITALS — BP 152/68 | HR 91 | Temp 98.7°F | Resp 18 | Ht 61.0 in | Wt 164.2 lb

## 2022-04-26 DIAGNOSIS — D509 Iron deficiency anemia, unspecified: Secondary | ICD-10-CM | POA: Diagnosis not present

## 2022-04-26 DIAGNOSIS — C50311 Malignant neoplasm of lower-inner quadrant of right female breast: Secondary | ICD-10-CM | POA: Diagnosis not present

## 2022-04-26 DIAGNOSIS — Z170 Estrogen receptor positive status [ER+]: Secondary | ICD-10-CM | POA: Diagnosis not present

## 2022-04-26 DIAGNOSIS — N184 Chronic kidney disease, stage 4 (severe): Secondary | ICD-10-CM

## 2022-04-26 DIAGNOSIS — D631 Anemia in chronic kidney disease: Secondary | ICD-10-CM | POA: Diagnosis not present

## 2022-04-26 DIAGNOSIS — D518 Other vitamin B12 deficiency anemias: Secondary | ICD-10-CM | POA: Diagnosis not present

## 2022-04-26 DIAGNOSIS — E538 Deficiency of other specified B group vitamins: Secondary | ICD-10-CM | POA: Insufficient documentation

## 2022-04-26 DIAGNOSIS — D464 Refractory anemia, unspecified: Secondary | ICD-10-CM

## 2022-04-26 LAB — SAMPLE TO BLOOD BANK

## 2022-04-26 LAB — CBC WITH DIFFERENTIAL (CANCER CENTER ONLY)
Abs Immature Granulocytes: 0.24 10*3/uL — ABNORMAL HIGH (ref 0.00–0.07)
Basophils Absolute: 0 10*3/uL (ref 0.0–0.1)
Basophils Relative: 0 %
Eosinophils Absolute: 0 10*3/uL (ref 0.0–0.5)
Eosinophils Relative: 0 %
HCT: 28 % — ABNORMAL LOW (ref 36.0–46.0)
Hemoglobin: 9.3 g/dL — ABNORMAL LOW (ref 12.0–15.0)
Immature Granulocytes: 2 %
Lymphocytes Relative: 9 %
Lymphs Abs: 1.1 10*3/uL (ref 0.7–4.0)
MCH: 29.9 pg (ref 26.0–34.0)
MCHC: 33.2 g/dL (ref 30.0–36.0)
MCV: 90 fL (ref 80.0–100.0)
Monocytes Absolute: 0.8 10*3/uL (ref 0.1–1.0)
Monocytes Relative: 6 %
Neutro Abs: 9.9 10*3/uL — ABNORMAL HIGH (ref 1.7–7.7)
Neutrophils Relative %: 83 %
Platelet Count: 236 10*3/uL (ref 150–400)
RBC: 3.11 MIL/uL — ABNORMAL LOW (ref 3.87–5.11)
RDW: 16.1 % — ABNORMAL HIGH (ref 11.5–15.5)
WBC Count: 12 10*3/uL — ABNORMAL HIGH (ref 4.0–10.5)
nRBC: 0.2 % (ref 0.0–0.2)

## 2022-04-26 LAB — IRON AND IRON BINDING CAPACITY (CC-WL,HP ONLY)
Iron: 83 ug/dL (ref 28–170)
Saturation Ratios: 24 % (ref 10.4–31.8)
TIBC: 347 ug/dL (ref 250–450)
UIBC: 264 ug/dL (ref 148–442)

## 2022-04-26 LAB — FERRITIN: Ferritin: 78 ng/mL (ref 11–307)

## 2022-04-26 LAB — VITAMIN B12: Vitamin B-12: 566 pg/mL (ref 180–914)

## 2022-04-26 MED ORDER — EPOETIN ALFA-EPBX 40000 UNIT/ML IJ SOLN
40000.0000 [IU] | Freq: Once | INTRAMUSCULAR | Status: AC
  Administered 2022-04-26: 40000 [IU] via SUBCUTANEOUS
  Filled 2022-04-26: qty 1

## 2022-04-26 MED ORDER — CYANOCOBALAMIN 1000 MCG/ML IJ SOLN
1000.0000 ug | Freq: Once | INTRAMUSCULAR | Status: AC
  Administered 2022-04-26: 1000 ug via INTRAMUSCULAR
  Filled 2022-04-26: qty 1

## 2022-04-26 NOTE — Assessment & Plan Note (Signed)
-  Her insurance did not approve Aranesp so she was switched to Retacrit every 2 weeks on 11/14/18. Goal is Hg 9-11. Due to worsened anemia, her dose has been increased, currently at 40,000 every 4 weeks.

## 2022-04-26 NOTE — Assessment & Plan Note (Signed)
Started receiving monthly B12 injections in our clinic on 05/04/19, currently on every 6 weeks

## 2022-04-26 NOTE — Progress Notes (Signed)
[facility]   [phone] [phone]   Clinic Follow up Note   Patient Care Team: Seward Carol, MD as PCP - General (Internal Medicine) Josue Hector, MD as PCP - Cardiology (Cardiology) Thelma Comp, [place] as Consulting Physician (Optometry) Stark Klein, MD as Consulting Physician (General Surgery) Truitt Merle, MD as Consulting Physician (Hematology) Madelon Lips, MD as Consulting Physician (Nephrology)  Date of Service:  04/26/2022  CHIEF COMPLAINT: f/u of anemia of chronic disease, h/o right breast cancer   CURRENT THERAPY:  -Retacrit injection starting 09/2018, currently q2weeks, hold if Hg>10.5 -B12 injection starting 05/04/19, currently q6weeks -Oral Ferrous sulfate daily  -IV Feraheme as needed starting 09/15/18. Last on 12/21/19    ASSESSMENT:  [name] is a 77 y.o. female with   Breast cancer of lower-inner quadrant of right female breast ([place]) invasive ductal carcinoma with DCIS, pT1bN0Mo, stage IA,   ER+ and PR +, HER 2 negative -diagnosed 02/2013, s/p right lumpectomy, adjuvant radiation and Anastrozole for 4 years until 07/2017.  Anastrozole was stopped after she developed septic shock from pneumonia and a cardiac arrest, and we decided not restart due to the risk of cardiac toxicity. -Her 07/11/20 Mammogram showed suspicious right breast mass. She underwent breast biopsy on 07/28/20 which showed fibroadipose and necrosis, no malignancy. This area is palpable on exam today. We will continue to monitor. -she is clinically doing well.   B12 deficiency anemia Started receiving monthly B12 injections in our clinic on 05/04/19, currently on every 6 weeks    Anemia of chronic kidney failure, stage 4 (severe) ([place]) -Her insurance did not approve Aranesp so she was switched to Retacrit every 2 weeks on 11/14/18. Goal is Hg 9-11. Due to worsened anemia, her dose has been increased, currently at 40,000 every 4 weeks.   Iron  deficiency anemia -Probably secondary to GI bleeding, also endoscopy was negative. -Her previous bone marrow biopsy in 2020 was negative. -She has been receiving IV iron as needed for ferritin less than 100.    PLAN: -lab reviewed -proceed with Retacrit injection, B12 injection -Give Iron if Ferritin<100 -request IV Iron in 2 weeks -lab ,f/u in 8 weeks  SUMMARY OF ONCOLOGIC HISTORY: Oncology History Overview Note  Cancer Staging Breast cancer of lower-inner quadrant of right female breast ([place]) Staging form: Breast, AJCC 7th Edition - Clinical: Stage IA (T1b, N0, cM0) - Unsigned Staging comments: Staged at breast conference 03/25/13.  - Pathologic stage from 05/12/2013: Stage IA (T1b, N0, cM0) - Signed by Truitt Merle, MD on 06/21/2016     Breast cancer of lower-inner quadrant of right female breast ([place])  02/27/2013 Mammogram   Diagnostic mammogram and ultrasound showed a 6 x 5 mm mass at 5:00 position. Axilla was negative by ultrasound.   03/16/2013 Initial Biopsy   Right breast 5:00 position biopsy showed invasive ductal carcinoma, low-grade   03/16/2013 Receptors her2   ER100% positive, PR 100% positive, HER-2 negative, Ki-67 20%   03/18/2013 Initial Diagnosis   Breast cancer of lower-inner quadrant of right female breast ([place])   05/12/2013 Surgery   Right breast lumpectomy with sentinel lymph node biopsy   05/12/2013 Pathology Results   Right breast lumpectomy showed invasive ductal carcinoma, grade 2, 0.9 cm, 6 sentinel lymph nodes were all negative. Margins were negative.   05/12/2013 Oncotype testing   Oncotype recurrence score 19, intermediate risk, 10 year risk of distant recurrence 12% with tamoxifen for 5 years. Patient declined adjuvant chemotherapy.   06/02/2013 -  07/16/2013 Radiation Therapy   Adjuvant breast radiation with boost 1600Gy   08/06/2013 - 07/2017 Anti-estrogen oral therapy   Arimidex 1 mg daily. Stopped in 07/2017 due to acute illness    03/22/2017  Mammogram   IMPRESSION: No mammographic evidence for malignancy.   Iron deficiency anemia     INTERVAL HISTORY:  [name] is here for a follow up of  anemia of chronic disease, h/o right breast cancer She was last seen by  Elsie Lincoln on 11/24/2021 She presents to the clinic alone.Pt states that her energy level is ok and she has some SOB when doing activities.   All other systems were reviewed with the patient and are negative.  MEDICAL HISTORY:  Past Medical History:  Diagnosis Date   Abdominal discomfort    Chronic N/V/D. Presumptive dx Crohn's dx per elevated p ANCA. Failed Entocort and Pentasa. Sep 2003 - ileocolectomy c anastomosis per Dr Deon Pilling 2/2 adhesions - path was hegative for Crohns. EGD, Sm bowel follow through (11/03), and an eteroclysis (10/03) were unrevealing. Cuases hypomag and hypocalcemia.   Adnexal mass 8/03   s/p lap BSO (R ovarian fibroma) & lysis of adhesions   Allergy    Seasonal   Anemia    Multifactorial. Baseline HgB 10-11 ish. B12 def - 150 in 3/10. Fe Def - ferritin 35 3/10. Both are being repleted.   Breast cancer ([place]) 03/16/13   right, 5 o'clock   CAD (coronary artery disease) 1996   1996 - PTCA and angioplasty diagonal branch. 2000 - Rotoblator & angiopllasty of diagonal. 2006 - subendocardial AMI, DES to proximal LAD.Marland Kitchen Also had 90% stenosis in distal apical LAD. EF 55 with apical hypokinesis. Indefinite ASA and Plavix.   CHF (congestive heart failure) (HCC)    Chronic kidney disease    Chronic renal insuff baseline Cr 1.2 - 1.4 ish.   Chronic kidney disease, stage III (moderate) (HCC)    Chronic renal insuff baseline Cr 1.2 - 1.4 ish. New baseline about 2 (2018)   Chronic pain    CT 10/10 = Spinal stenosis L2 - S1.   COVID-19 11/02/2020   Diabetes mellitus    Insulin dependent   Diabetes mellitus type 2 in obese ([place]) 05/03/2010   Managed on lantus and novolog. Has diabetic nephropathy. Metformin D/C'd 2012 2/2 creatinine.  No  diabetic retinopathy per 3/11.    Gout    Hx of radiation therapy 06/02/13- 07/16/13   right rbeast 4500 cGy 25 sessions, right breast boost 1600 cGy in 8 sessions   Hyperlipidemia    Managed with both a statin and Welchol. Welchol stopped 2014 2/2 cost and started on fenofibrate    Hypertension    2006 B renal arteries patent. 2003 MRA - no RAS. 2003 pheo W/U Dr Hassell Done reportedly negative.   Hypoxia 07/23/2017   Lupus ([place])    Lymphedema of breast    Personal history of radiation therapy 2015   Pneumonia of left lung due to infectious organism    RBBB    Renal vein thrombosis (HCC)    SBO (small bowel obstruction) ([place]) 09/17/2017   Secondary hyperparathyroidism ([place])    Stroke ([place])    Incidental finding MRI 2002 L lacunar infarct   Vitamin B12 deficiency    Vitamin D deficiency    Wears dentures    top    SURGICAL HISTORY: Past Surgical History:  Procedure Laterality Date   ABDOMINAL HYSTERECTOMY     BILATERAL SALPINGOOPHORECTOMY  8/03  Lap BSO (R ovarian fibroma) and adhesion lysis   BIOPSY  09/08/2020   Procedure: BIOPSY;  Surgeon: Ronald Lobo, MD;  Location: [facility];  Service: Endoscopy;;   BOWEL RESECTION  2003   ileocolectomy with anastomosis 2/2 adhesions   BREAST BIOPSY Right 2015   BREAST BIOPSY Right 07/2014   BREAST LUMPECTOMY Right 04/22/2013   BREAST LUMPECTOMY WITH NEEDLE LOCALIZATION AND AXILLARY SENTINEL LYMPH NODE BX Right 04/22/2013   Procedure: BREAST LUMPECTOMY WITH NEEDLE LOCALIZATION AND AXILLARY SENTINEL LYMPH NODE BX;  Surgeon: Stark Klein, MD;  Location: [facility];  Service: General;  Laterality: Right;   CARDIAC CATHETERIZATION     2 stents   CHOLECYSTECTOMY     COLONOSCOPY     ESOPHAGOGASTRODUODENOSCOPY N/A 09/08/2020   Procedure: ESOPHAGOGASTRODUODENOSCOPY (EGD);  Surgeon: Ronald Lobo, MD;  Location: [facility];  Service: Endoscopy;  Laterality: N/A;   HEMICOLECTOMY     R sided hemicolectomy   HERNIA REPAIR      Ventral hernia repair   PTCA  4/06    I have reviewed the social history and family history with the patient and they are unchanged from previous note.  ALLERGIES:  is allergic to percocet [oxycodone-acetaminophen]; haloperidol lactate; lorazepam; morphine sulfate; propoxyphene hcl; tramadol hcl; valium [diazepam]; anesthetics, halogenated; etomidate; fentanyl; haloperidol; morphine sulfate; propoxyphene; tramadol hcl; and versed [midazolam].  MEDICATIONS:  Current Outpatient Medications  Medication Sig Dispense Refill   acetaminophen (TYLENOL) 325 MG tablet Take 2 tablets (650 mg total) by mouth every 6 (six) hours as needed for mild pain (or Fever >/= 101). (Patient taking differently: Take 650 mg by mouth every 6 (six) hours as needed for mild pain.)     amLODipine (NORVASC) 10 MG tablet Take 1 tablet (10 mg total) by mouth daily. 90 tablet 1   apixaban (ELIQUIS) 2.5 MG TABS tablet Take 1 tablet (2.5 mg total) by mouth 2 (two) times daily. 60 tablet 0   budesonide (ENTOCORT EC) 3 MG 24 hr capsule Take 1 capsule by mouth once daily (Patient taking differently: Take 3 mg by mouth daily.) 30 capsule 0   ergocalciferol (VITAMIN D2) 1.25 MG (50000 UT) capsule Take 1 capsule (50,000 Units total) by mouth once a week. 4 capsule 1   famotidine (PEPCID) 20 MG tablet Take 10 mg by mouth at bedtime.     FLUoxetine (PROZAC) 20 MG capsule Take 20 mg by mouth daily.     furosemide (LASIX) 40 MG tablet Take 1 tablet (40 mg total) by mouth daily. (Patient taking differently: Take 40 mg by mouth daily as needed for fluid.) 30 tablet 0   furosemide (LASIX) 40 MG tablet Take 1 tablet (40 mg total) by mouth daily. 90 tablet 3   hydrALAZINE (APRESOLINE) 50 MG tablet Take 1 tablet (50 mg total) by mouth every 8 (eight) hours. 90 tablet 0   lipase/protease/amylase (CREON) 36000 UNITS CPEP capsule Take 36,000 Units by mouth 3 (three) times daily with meals.     loperamide (IMODIUM A-D) 2 MG tablet Take 4 mg by  mouth 4 (four) times daily as needed for diarrhea or loose stools.     mirtazapine (REMERON) 7.5 MG tablet Take 1 tablet (7.5 mg total) by mouth at bedtime. 90 tablet 1   nitroGLYCERIN (NITROSTAT) 0.4 MG SL tablet DISSOLVE 1 TABLET UNDER THE TONGUE EVERY 5 MINUTES AS  NEEDED FOR CHEST PAIN. MAX  OF 3 TABLETS IN 15 MINUTES. CALL 911 IF PAIN PERSISTS. (Patient taking differently: Place 0.4 mg under  the tongue every 5 (five) minutes as needed for chest pain.) 25 tablet 0   ondansetron (ZOFRAN-ODT) 4 MG disintegrating tablet Take 1 tablet (4 mg total) by mouth every 8 (eight) hours as needed for nausea or vomiting. 20 tablet 0   pantoprazole (PROTONIX) 40 MG tablet Take 1 tablet (40 mg total) by mouth 2 (two) times daily. 30 tablet 0   potassium chloride (KLOR-CON) 20 MEQ packet Take 1 tablet (20 mEq) by mouth daily. 30 packet 0   No current facility-administered medications for this visit.    PHYSICAL EXAMINATION: ECOG PERFORMANCE STATUS: 1 - Symptomatic but completely ambulatory  There were no vitals filed for this visit. Wt Readings from Last 3 Encounters:  02/06/22 151 lb 7.3 oz (68.7 kg)  12/26/21 151 lb 9.1 oz (68.8 kg)  12/23/21 151 lb 9.1 oz (68.8 kg)     GENERAL:alert, no distress and comfortable SKIN: skin color normal, no rashes or significant lesions EYES: normal, Conjunctiva are pink and non-injected, sclera clear  NEURO: alert & oriented x 3 with fluent speech  LABORATORY DATA:  I have reviewed the data as listed    Latest Ref Rng & Units 04/26/2022    1:28 PM 02/16/2022    1:21 PM 02/03/2022    4:06 AM  CBC  WBC 4.0 - 10.5 K/uL 12.0  10.6  8.1   Hemoglobin 12.0 - 15.0 g/dL 9.3  10.2  10.8   Hematocrit 36.0 - 46.0 % 28.0  32.4  35.5   Platelets 150 - 400 K/uL 236  250  204         Latest Ref Rng & Units 02/16/2022    1:21 PM 02/06/2022    4:06 AM 02/05/2022    4:30 AM  CMP  Glucose 70 - 99 mg/dL 118  89  91   BUN 8 - 23 mg/dL 92  69  78   Creatinine 0.44 - 1.00  mg/dL 3.73  2.69  3.01   Sodium 135 - 145 mmol/L 139  142  144   Potassium 3.5 - 5.1 mmol/L 3.9  3.8  4.5   Chloride 98 - 111 mmol/L 101  108  110   CO2 22 - 32 mmol/L 28  25  26   $ Calcium 8.9 - 10.3 mg/dL 9.5  9.3  9.3   Total Protein 6.5 - 8.1 g/dL 8.0     Total Bilirubin 0.3 - 1.2 mg/dL 0.3     Alkaline Phos 38 - 126 U/L 86     AST 15 - 41 U/L 15     ALT 0 - 44 U/L 18         RADIOGRAPHIC STUDIES: I have personally reviewed the radiological images as listed and agreed with the findings in the report. No results found.    No orders of the defined types were placed in this encounter.  All questions were answered. The patient knows to call the clinic with any problems, questions or concerns. No barriers to learning was detected. The total time spent in the appointment was 25 minutes.     Baldemar Friday, CMA 04/26/2022   I, Audry Riles, CMA, am acting as scribe for Truitt Merle, MD.   I have reviewed the above documentation for accuracy and completeness, and I agree with the above.

## 2022-04-26 NOTE — Assessment & Plan Note (Signed)
-  Probably secondary to GI bleeding, also endoscopy was negative. -Her previous bone marrow biopsy in 2020 was negative. -She has been receiving IV iron as needed for ferritin less than 100.

## 2022-04-26 NOTE — Assessment & Plan Note (Signed)
invasive ductal carcinoma with DCIS, pT1bN0Mo, stage IA,   ER+ and PR +, HER 2 negative -diagnosed 02/2013, s/p right lumpectomy, adjuvant radiation and Anastrozole for 4 years until 07/2017.  Anastrozole was stopped after she developed septic shock from pneumonia and a cardiac arrest, and we decided not restart due to the risk of cardiac toxicity. -Her 07/11/20 Mammogram showed suspicious right breast mass. She underwent breast biopsy on 07/28/20 which showed fibroadipose and necrosis, no malignancy. This area is palpable on exam today. We will continue to monitor. -she is clinically doing well.

## 2022-04-28 ENCOUNTER — Encounter: Payer: Self-pay | Admitting: Hematology

## 2022-05-08 ENCOUNTER — Other Ambulatory Visit (HOSPITAL_COMMUNITY): Payer: Self-pay

## 2022-05-10 ENCOUNTER — Other Ambulatory Visit: Payer: Self-pay

## 2022-05-10 ENCOUNTER — Emergency Department (HOSPITAL_COMMUNITY)
Admission: EM | Admit: 2022-05-10 | Discharge: 2022-05-10 | Disposition: A | Discharge status: Home / Self Care | Payer: Medicare PPO | Attending: Emergency Medicine | Admitting: Emergency Medicine

## 2022-05-10 ENCOUNTER — Inpatient Hospital Stay: Payer: Medicare PPO

## 2022-05-10 ENCOUNTER — Other Ambulatory Visit: Payer: Self-pay | Admitting: Hematology

## 2022-05-10 ENCOUNTER — Emergency Department (HOSPITAL_BASED_OUTPATIENT_CLINIC_OR_DEPARTMENT_OTHER)
Admit: 2022-05-10 | Discharge: 2022-05-10 | Disposition: A | Discharge status: Home / Self Care | Payer: Medicare PPO | Attending: Emergency Medicine | Admitting: Emergency Medicine

## 2022-05-10 VITALS — BP 200/98 | HR 88 | Temp 98.4°F | Resp 17

## 2022-05-10 DIAGNOSIS — R609 Edema, unspecified: Secondary | ICD-10-CM | POA: Diagnosis not present

## 2022-05-10 DIAGNOSIS — I130 Hypertensive heart and chronic kidney disease with heart failure and stage 1 through stage 4 chronic kidney disease, or unspecified chronic kidney disease: Secondary | ICD-10-CM | POA: Insufficient documentation

## 2022-05-10 DIAGNOSIS — Z79899 Other long term (current) drug therapy: Secondary | ICD-10-CM | POA: Diagnosis not present

## 2022-05-10 DIAGNOSIS — D631 Anemia in chronic kidney disease: Secondary | ICD-10-CM | POA: Diagnosis not present

## 2022-05-10 DIAGNOSIS — I509 Heart failure, unspecified: Secondary | ICD-10-CM | POA: Diagnosis not present

## 2022-05-10 DIAGNOSIS — N189 Chronic kidney disease, unspecified: Secondary | ICD-10-CM | POA: Insufficient documentation

## 2022-05-10 DIAGNOSIS — I160 Hypertensive urgency: Secondary | ICD-10-CM | POA: Insufficient documentation

## 2022-05-10 DIAGNOSIS — D509 Iron deficiency anemia, unspecified: Secondary | ICD-10-CM

## 2022-05-10 DIAGNOSIS — D464 Refractory anemia, unspecified: Secondary | ICD-10-CM

## 2022-05-10 DIAGNOSIS — E1122 Type 2 diabetes mellitus with diabetic chronic kidney disease: Secondary | ICD-10-CM | POA: Diagnosis not present

## 2022-05-10 DIAGNOSIS — Z7901 Long term (current) use of anticoagulants: Secondary | ICD-10-CM | POA: Insufficient documentation

## 2022-05-10 DIAGNOSIS — E538 Deficiency of other specified B group vitamins: Secondary | ICD-10-CM | POA: Diagnosis not present

## 2022-05-10 DIAGNOSIS — R2242 Localized swelling, mass and lump, left lower limb: Secondary | ICD-10-CM | POA: Diagnosis not present

## 2022-05-10 DIAGNOSIS — N184 Chronic kidney disease, stage 4 (severe): Secondary | ICD-10-CM

## 2022-05-10 LAB — CBC WITH DIFFERENTIAL (CANCER CENTER ONLY)
Abs Immature Granulocytes: 0.03 10*3/uL (ref 0.00–0.07)
Basophils Absolute: 0 10*3/uL (ref 0.0–0.1)
Basophils Relative: 0 %
Eosinophils Absolute: 0 10*3/uL (ref 0.0–0.5)
Eosinophils Relative: 0 %
HCT: 28 % — ABNORMAL LOW (ref 36.0–46.0)
Hemoglobin: 9.3 g/dL — ABNORMAL LOW (ref 12.0–15.0)
Immature Granulocytes: 0 %
Lymphocytes Relative: 12 %
Lymphs Abs: 1 10*3/uL (ref 0.7–4.0)
MCH: 29.6 pg (ref 26.0–34.0)
MCHC: 33.2 g/dL (ref 30.0–36.0)
MCV: 89.2 fL (ref 80.0–100.0)
Monocytes Absolute: 0.5 10*3/uL (ref 0.1–1.0)
Monocytes Relative: 7 %
Neutro Abs: 6.5 10*3/uL (ref 1.7–7.7)
Neutrophils Relative %: 81 %
Platelet Count: 245 10*3/uL (ref 150–400)
RBC: 3.14 MIL/uL — ABNORMAL LOW (ref 3.87–5.11)
RDW: 16 % — ABNORMAL HIGH (ref 11.5–15.5)
WBC Count: 8.1 10*3/uL (ref 4.0–10.5)
nRBC: 0 % (ref 0.0–0.2)

## 2022-05-10 LAB — CBC WITH DIFFERENTIAL/PLATELET
Abs Immature Granulocytes: 0.04 10*3/uL (ref 0.00–0.07)
Basophils Absolute: 0 10*3/uL (ref 0.0–0.1)
Basophils Relative: 0 %
Eosinophils Absolute: 0 10*3/uL (ref 0.0–0.5)
Eosinophils Relative: 0 %
HCT: 29.9 % — ABNORMAL LOW (ref 36.0–46.0)
Hemoglobin: 9.3 g/dL — ABNORMAL LOW (ref 12.0–15.0)
Immature Granulocytes: 1 %
Lymphocytes Relative: 15 %
Lymphs Abs: 1.2 10*3/uL (ref 0.7–4.0)
MCH: 28.5 pg (ref 26.0–34.0)
MCHC: 31.1 g/dL (ref 30.0–36.0)
MCV: 91.7 fL (ref 80.0–100.0)
Monocytes Absolute: 0.5 10*3/uL (ref 0.1–1.0)
Monocytes Relative: 7 %
Neutro Abs: 6.1 10*3/uL (ref 1.7–7.7)
Neutrophils Relative %: 77 %
Platelets: 224 10*3/uL (ref 150–400)
RBC: 3.26 MIL/uL — ABNORMAL LOW (ref 3.87–5.11)
RDW: 15.9 % — ABNORMAL HIGH (ref 11.5–15.5)
WBC: 8 10*3/uL (ref 4.0–10.5)
nRBC: 0 % (ref 0.0–0.2)

## 2022-05-10 LAB — COMPREHENSIVE METABOLIC PANEL
ALT: 11 U/L (ref 0–44)
AST: 12 U/L — ABNORMAL LOW (ref 15–41)
Albumin: 3.6 g/dL (ref 3.5–5.0)
Alkaline Phosphatase: 64 U/L (ref 38–126)
Anion gap: 11 (ref 5–15)
BUN: 43 mg/dL — ABNORMAL HIGH (ref 8–23)
CO2: 19 mmol/L — ABNORMAL LOW (ref 22–32)
Calcium: 9 mg/dL (ref 8.9–10.3)
Chloride: 106 mmol/L (ref 98–111)
Creatinine, Ser: 2.35 mg/dL — ABNORMAL HIGH (ref 0.44–1.00)
GFR, Estimated: 21 mL/min — ABNORMAL LOW (ref >60)
Glucose, Bld: 113 mg/dL — ABNORMAL HIGH (ref 70–99)
Potassium: 3.6 mmol/L (ref 3.5–5.1)
Sodium: 136 mmol/L (ref 135–145)
Total Bilirubin: 0.5 mg/dL (ref 0.3–1.2)
Total Protein: 7.9 g/dL (ref 6.5–8.1)

## 2022-05-10 LAB — SAMPLE TO BLOOD BANK

## 2022-05-10 LAB — TROPONIN I (HIGH SENSITIVITY): Troponin I (High Sensitivity): 12 ng/L (ref <18)

## 2022-05-10 MED ORDER — LORATADINE 10 MG PO TABS
10.0000 mg | ORAL_TABLET | Freq: Once | ORAL | Status: AC
  Administered 2022-05-10: 10 mg via ORAL
  Filled 2022-05-10: qty 1

## 2022-05-10 MED ORDER — SODIUM CHLORIDE 0.9 % IV SOLN
200.0000 mg | Freq: Once | INTRAVENOUS | Status: AC
  Administered 2022-05-10: 200 mg via INTRAVENOUS
  Filled 2022-05-10: qty 200

## 2022-05-10 MED ORDER — ACETAMINOPHEN 500 MG PO TABS
1000.0000 mg | ORAL_TABLET | Freq: Once | ORAL | Status: AC
  Administered 2022-05-10: 1000 mg via ORAL
  Filled 2022-05-10: qty 2

## 2022-05-10 MED ORDER — SODIUM CHLORIDE 0.9 % IV SOLN: Freq: Once | INTRAVENOUS | Status: AC

## 2022-05-10 MED ORDER — HYDRALAZINE HCL 25 MG PO TABS
50.0000 mg | ORAL_TABLET | Freq: Once | ORAL | Status: AC
  Administered 2022-05-10: 50 mg via ORAL
  Filled 2022-05-10: qty 2

## 2022-05-10 NOTE — ED Triage Notes (Signed)
Pt came in from Cancer center. She was there for an iron infusion. Vitals WDL pre-procedure BP 155/70. Post-procedure pt became hypertensive 200/70.

## 2022-05-10 NOTE — Progress Notes (Signed)
Left lower extremity venous duplex has been completed. Preliminary results can be found in CV Proc through chart review.  Results were given to Dr. Regenia Skeeter.  05/10/22 6:43 PM Carlos Levering RVT

## 2022-05-10 NOTE — Progress Notes (Signed)
Pt post infusion BP elevated 200s/90s, no chest pain, dizzziness or headache.  Dr Chryl Heck (on-call MD) called and orders received for hydralize IV or pt can go to ED.  After discussing with pt and family, they chose to go to the ED.  Rapid Response paged and notified ED charge RN.  Pt was taken to room 23 via wheelchair in stable condition with family at bedside  Upon arrival to ED, report given at bedside to ED charge nurse.

## 2022-05-10 NOTE — Discharge Instructions (Signed)
Be sure to log what your blood pressures do.  Take your blood pressure in the morning and keep a record so he can show Dr. Delfina Redwood.  If you develop headache, chest pain, trouble breathing, abdominal or back pain, weakness or numbness, speech difficulty, vision changes, or any other new/concerning symptoms then return to the ER for evaluation.

## 2022-05-10 NOTE — ED Provider Notes (Signed)
[facility] Provider Note   CSN: [id number] Arrival date & time: 05/10/22  1730     History  No chief complaint on file.   [name] is a 77 y.o. female.  HPI 77 year old female with a history of CHF, CKD, diabetes, hypertension, anemia, and multiple other comorbidities presents with hypertension.  She was in the cancer center getting an iron infusion and upon discharge her blood pressure was around 220/90 so they sent her down here.  She states she is having a little bit of bilateral neck discomfort that is been there for couple days that hurts when she twists but no actual stiffness.  However no headache, chest pain, vision complaints, weakness or numbness or abdominal/back pain.  She has been having some left foot discomfort for couple days and it seems swollen to her.  No trauma.  Patient states that she checks her blood pressure every day but does remember the numbers.  She tells me that she took her meds this morning.  When going through meds and asking about hydralazine, she is under the understanding it is a as needed medicine though she did take it this morning but not since.  Home Medications Prior to Admission medications   Medication Sig Start Date End Date Taking? Authorizing Provider  acetaminophen (TYLENOL) 325 MG tablet Take 2 tablets (650 mg total) by mouth every 6 (six) hours as needed for mild pain (or Fever >/= 101). Patient taking differently: Take 650 mg by mouth every 6 (six) hours as needed for mild pain. 11/08/21  Yes Elgergawy, Silver Huguenin, MD  amLODipine (NORVASC) 10 MG tablet Take 1 tablet (10 mg total) by mouth daily. 03/28/22  Yes   apixaban (ELIQUIS) 2.5 MG TABS tablet Take 1 tablet (2.5 mg total) by mouth 2 (two) times daily. 10/30/21  Yes Arrien, Jimmy Picket, MD  budesonide (ENTOCORT EC) 3 MG 24 hr capsule Take 1 capsule by mouth once daily Patient taking differently: Take 3 mg by mouth 3 (three) times  daily. 11/30/20  Yes Gatha Mayer, MD  famotidine (PEPCID) 20 MG tablet Take 20 mg by mouth daily as needed for heartburn or indigestion. 10/14/21  Yes [provider]  FLUoxetine (PROZAC) 20 MG capsule Take 20 mg by mouth daily. 10/12/21  Yes [provider]  furosemide (LASIX) 40 MG tablet Take 1 tablet (40 mg total) by mouth daily. Patient taking differently: Take 40 mg by mouth in the morning. 03/28/22  Yes   hydrALAZINE (APRESOLINE) 50 MG tablet Take 50 mg by mouth in the morning and at bedtime.   Yes [provider]  mirtazapine (REMERON) 7.5 MG tablet Take 1 tablet (7.5 mg total) by mouth at bedtime. 12/22/20  Yes Truitt Merle, MD  nitroGLYCERIN (NITROSTAT) 0.4 MG SL tablet DISSOLVE 1 TABLET UNDER THE TONGUE EVERY 5 MINUTES AS  NEEDED FOR CHEST PAIN. MAX  OF 3 TABLETS IN 15 MINUTES. CALL 911 IF PAIN PERSISTS. Patient taking differently: Place 0.4 mg under the tongue every 5 (five) minutes as needed for chest pain. 10/04/20  Yes Jerline Pain, MD  pantoprazole (PROTONIX) 40 MG tablet Take 1 tablet (40 mg total) by mouth 2 (two) times daily. 12/23/21  Yes Katie Pence, MD  ergocalciferol (VITAMIN D2) 1.25 MG (50000 UT) capsule Take 1 capsule (50,000 Units total) by mouth once a week. Patient not taking: Reported on 05/10/2022 08/02/20   Truitt Merle, MD  furosemide (LASIX) 40 MG tablet Take 1  tablet (40 mg total) by mouth daily. Patient not taking: Reported on 05/10/2022 11/26/21 05/10/22  [name], PA-C  ondansetron (ZOFRAN-ODT) 4 MG disintegrating tablet Take 1 tablet (4 mg total) by mouth every 8 (eight) hours as needed for nausea or vomiting. Patient not taking: Reported on 05/10/2022 12/23/21   Katie Pence, MD  potassium chloride (KLOR-CON) 20 MEQ packet Take 1 tablet (20 mEq) by mouth daily. Patient not taking: Reported on 05/10/2022 10/31/21 11/30/21  Arrien, Jimmy Picket, MD      Allergies    Anesthetics, halogenated; Etomidate; Fentanyl; Percocet  [oxycodone-acetaminophen]; Diazepam; Haloperidol lactate; Lorazepam; Morphine sulfate; Propoxyphene hcl; Tramadol hcl; Haloperidol; Morphine sulfate; Oxycodone-acetaminophen; Propoxyphene; Tramadol hcl; and Versed [midazolam]    Review of Systems   Review of Systems  Eyes:  Negative for visual disturbance.  Respiratory:  Negative for shortness of breath.   Cardiovascular:  Positive for leg swelling. Negative for chest pain.  Gastrointestinal:  Negative for abdominal pain.  Musculoskeletal:  Positive for neck pain.  Neurological:  Negative for weakness, numbness and headaches.    Physical Exam Updated Vital Signs BP (!) 175/73   Pulse 93   Temp 98.4 F (36.9 C) (Oral)   Resp 20   SpO2 97%  Physical Exam Vitals and nursing note reviewed.  Constitutional:      General: She is not in acute distress.    Appearance: She is well-developed. She is not ill-appearing or diaphoretic.  HENT:     Head: Normocephalic and atraumatic.  Eyes:     Extraocular Movements: Extraocular movements intact.  Neck:     Comments: Mild bilateral posterior neck/trapezius tenderness. No midline tenderness. No stiffness/decreased ROM Cardiovascular:     Rate and Rhythm: Normal rate and regular rhythm.     Pulses:          Dorsalis pedis pulses are 2+ on the left side.     Heart sounds: Normal heart sounds.  Pulmonary:     Effort: Pulmonary effort is normal.     Breath sounds: Normal breath sounds.  Abdominal:     Palpations: Abdomen is soft.     Tenderness: There is no abdominal tenderness.  Musculoskeletal:     Cervical back: Normal range of motion. No rigidity.     Left lower leg: Edema present.     Comments: There is some mild pedal swelling and mild calf swelling in LLE. No erythema/skin color change compared to right. No increased warmth. No significant tenderness or decreased ROM of ankle/leg  Skin:    General: Skin is warm and dry.  Neurological:     Mental Status: She is alert.      Comments: CN 3-12 grossly intact. 5/5 strength in all 4 extremities. Grossly normal sensation. Normal finger to nose.      ED Results / Procedures / Treatments   Labs (all labs ordered are listed, but only abnormal results are displayed) Labs Reviewed  COMPREHENSIVE METABOLIC PANEL - Abnormal; Notable for the following components:      Result Value   CO2 19 (*)    Glucose, Bld 113 (*)    BUN 43 (*)    Creatinine, Ser 2.35 (*)    AST 12 (*)    GFR, Estimated 21 (*)    All other components within normal limits  CBC WITH DIFFERENTIAL/PLATELET - Abnormal; Notable for the following components:   RBC 3.26 (*)    Hemoglobin 9.3 (*)    HCT 29.9 (*)    RDW 15.9 (*)  All other components within normal limits  TROPONIN I (HIGH SENSITIVITY)    EKG EKG Interpretation  Date/Time:  Thursday May 10 2022 18:08:37 EST Ventricular Rate:  91 PR Interval:  152 QRS Duration: 141 QT Interval:  407 QTC Calculation: 501 R Axis:   -34 Text Interpretation: Sinus rhythm Right bundle branch block no significant change since Nov 2023 Confirmed by Sherwood Gambler [id number]) on 05/10/2022 6:53:43 PM  Radiology VAS [place] LOWER EXTREMITY VENOUS (DVT) (7a-7p)  Result Date: 05/10/2022  Lower Venous DVT Study Patient Name:  Maty [name] Hiley  Date of Exam:   05/10/2022 Medical Rec #: [id number]           Accession #:    [id number] Date of Birth: [birth year]           Patient Gender: F Patient Age:   59 years Exam Location:  [facility] Procedure:      VAS [place] LOWER EXTREMITY VENOUS (DVT) Referring Phys: Nicki Reaper Kimiko Common --------------------------------------------------------------------------------  Indications: Edema.  Risk Factors: None identified. Comparison Study: No prior studies. Performing Technologist: Oliver Hum RVT  Examination Guidelines: A complete evaluation includes B-mode imaging, spectral Doppler, color Doppler, and power Doppler as needed of all accessible portions of each vessel.  Bilateral testing is considered an integral part of a complete examination. Limited examinations for reoccurring indications may be performed as noted. The reflux portion of the exam is performed with the patient in reverse Trendelenburg.  +-----+---------------+---------+-----------+----------+--------------+ RIGHTCompressibilityPhasicitySpontaneityPropertiesThrombus Aging +-----+---------------+---------+-----------+----------+--------------+ CFV  Full           Yes      Yes                                 +-----+---------------+---------+-----------+----------+--------------+   +---------+---------------+---------+-----------+----------+--------------+ LEFT     CompressibilityPhasicitySpontaneityPropertiesThrombus Aging +---------+---------------+---------+-----------+----------+--------------+ CFV      Full           Yes      Yes                                 +---------+---------------+---------+-----------+----------+--------------+ SFJ      Full                                                        +---------+---------------+---------+-----------+----------+--------------+ FV Prox  Full                                                        +---------+---------------+---------+-----------+----------+--------------+ FV Mid   Full           Yes      Yes                                 +---------+---------------+---------+-----------+----------+--------------+ FV DistalFull                                                        +---------+---------------+---------+-----------+----------+--------------+  PFV      Full                                                        +---------+---------------+---------+-----------+----------+--------------+ POP      Full           Yes      Yes                                 +---------+---------------+---------+-----------+----------+--------------+ PTV      Full                                                         +---------+---------------+---------+-----------+----------+--------------+ PERO     Full                                                        +---------+---------------+---------+-----------+----------+--------------+     Summary: RIGHT: - No evidence of common femoral vein obstruction.  LEFT: - There is no evidence of deep vein thrombosis in the lower extremity.  - No cystic structure found in the popliteal fossa.  *See table(s) above for measurements and observations.    Preliminary     Procedures Procedures    Medications Ordered in ED Medications  hydrALAZINE (APRESOLINE) tablet 50 mg (50 mg Oral Given 05/10/22 1801)  acetaminophen (TYLENOL) tablet 1,000 mg (1,000 mg Oral Given 05/10/22 1801)    ED Course/ Medical Decision Making/ A&P                             Medical Decision Making Amount and/or Complexity of Data Reviewed Labs: ordered.    Details: CKD but improved from December.  Anemia is chronic and stable.  Troponin is normal. ECG/medicine tests: ordered and independent interpretation performed.    Details: No change from baseline, no acute ischemia.  Risk OTC drugs. Prescription drug management.   Presents with asymptomatic hypertension.  She was given her home dose of hydralazine which she has not yet taken as her second out of 3 doses each day.  This has brought her blood pressure down to the 170s.  She remains asymptomatic.  She has had some nonspecific neck pain that feels more like a muscular type pain given some mild tenderness and pain with range of motion.  No trauma.  I do not think imaging is warranted.  Neuroexam is unremarkable.  I do not think she has a hypertensive emergency.  I doubt ACS.  Given multiple days of neck pain and a negative troponin I do not think further testing is needed.  As far as her leg swelling/foot swelling, it is pretty mild and her DVT ultrasound is negative.  No evidence of cellulitis.  She appears stable for  discharge home to follow-up with PCP.        Final Clinical Impression(s) / ED Diagnoses Final diagnoses:  Hypertensive urgency  Rx / DC Orders ED Discharge Orders     None         Sherwood Gambler, MD 05/10/22 2235

## 2022-05-11 ENCOUNTER — Other Ambulatory Visit: Payer: Self-pay

## 2022-05-11 ENCOUNTER — Encounter (HOSPITAL_COMMUNITY): Payer: Self-pay

## 2022-05-11 ENCOUNTER — Emergency Department (HOSPITAL_COMMUNITY)
Admission: EM | Admit: 2022-05-11 | Discharge: 2022-05-11 | Disposition: A | Discharge status: Home / Self Care | Payer: Medicare PPO | Attending: Emergency Medicine | Admitting: Emergency Medicine

## 2022-05-11 ENCOUNTER — Other Ambulatory Visit (HOSPITAL_COMMUNITY): Payer: Self-pay

## 2022-05-11 ENCOUNTER — Other Ambulatory Visit: Payer: Self-pay | Admitting: Physician Assistant

## 2022-05-11 ENCOUNTER — Encounter: Payer: Self-pay | Admitting: Hematology

## 2022-05-11 DIAGNOSIS — M62838 Other muscle spasm: Secondary | ICD-10-CM | POA: Diagnosis not present

## 2022-05-11 DIAGNOSIS — M542 Cervicalgia: Secondary | ICD-10-CM | POA: Diagnosis present

## 2022-05-11 DIAGNOSIS — N189 Chronic kidney disease, unspecified: Secondary | ICD-10-CM | POA: Diagnosis not present

## 2022-05-11 DIAGNOSIS — Z7901 Long term (current) use of anticoagulants: Secondary | ICD-10-CM | POA: Insufficient documentation

## 2022-05-11 MED ORDER — TIZANIDINE HCL 4 MG PO CAPS
4.0000 mg | ORAL_CAPSULE | Freq: Three times a day (TID) | ORAL | 0 refills | Status: DC
  Filled 2022-05-11: qty 15, 5d supply, fill #0

## 2022-05-11 MED ORDER — ACETAMINOPHEN 325 MG PO TABS
650.0000 mg | ORAL_TABLET | Freq: Once | ORAL | Status: AC
  Administered 2022-05-11: 650 mg via ORAL
  Filled 2022-05-11: qty 2

## 2022-05-11 MED ORDER — LIDOCAINE 5 % EX PTCH
1.0000 | MEDICATED_PATCH | CUTANEOUS | Status: DC
  Administered 2022-05-11: 1 via TRANSDERMAL
  Filled 2022-05-11: qty 1

## 2022-05-11 MED ORDER — ACETAMINOPHEN 500 MG PO TABS
500.0000 mg | ORAL_TABLET | Freq: Four times a day (QID) | ORAL | 0 refills | Status: DC | PRN
  Filled 2022-05-11: qty 30, 8d supply, fill #0

## 2022-05-11 MED ORDER — TIZANIDINE HCL 4 MG PO TABS
4.0000 mg | ORAL_TABLET | Freq: Once | ORAL | Status: AC
  Administered 2022-05-11: 4 mg via ORAL
  Filled 2022-05-11: qty 1

## 2022-05-11 NOTE — ED Notes (Signed)
Patient verbalizes understanding of discharge instructions. Opportunity for questioning and answers were provided. Pt discharged from ED. 

## 2022-05-11 NOTE — Telephone Encounter (Signed)
Rx was sent in on 03/28/22 by Seward Carol to [facility]

## 2022-05-11 NOTE — ED Provider Notes (Signed)
[facility] Provider Note   CSN: [id number] Arrival date & time: 05/11/22  0900     History  Chief Complaint  Patient presents with   Neck Pain   Shoulder Pain    [name] is a 77 y.o. female.  HPI     77 year old patient comes in with chief complaint of neck pain and shoulder pain. [name] is at the bedside, provides meaningful history.  According to the daughter, patient complained of pain to her shoulder and neck yesterday, while she was in the ER.  She was sent to the emergency room from her infusion center because of elevated blood pressure.  Patient states that she had neck pain that is well, but the pain is worse today.  She indicates that the pain is worse with any kind of movement of her head and also of her extremity.  She denies any specific traumatic incidents.  Patient has no history of similar pain in the past.  Review of system is negative for any severe headache, one-sided weakness, numbness, balance issues, vision change, slurred speech.  Home Medications Prior to Admission medications   Medication Sig Start Date End Date Taking? Authorizing Provider  acetaminophen (TYLENOL) 325 MG tablet Take 2 tablets (650 mg total) by mouth every 6 (six) hours as needed for mild pain (or Fever >/= 101). Patient taking differently: Take 650 mg by mouth every 6 (six) hours as needed for mild pain. 11/08/21   Elgergawy, Silver Huguenin, MD  amLODipine (NORVASC) 10 MG tablet Take 1 tablet (10 mg total) by mouth daily. 03/28/22     apixaban (ELIQUIS) 2.5 MG TABS tablet Take 1 tablet (2.5 mg total) by mouth 2 (two) times daily. 10/30/21   Arrien, Jimmy Picket, MD  budesonide (ENTOCORT EC) 3 MG 24 hr capsule Take 1 capsule by mouth once daily Patient taking differently: Take 3 mg by mouth 3 (three) times daily. 11/30/20   Gatha Mayer, MD  ergocalciferol (VITAMIN D2) 1.25 MG (50000 UT) capsule Take 1 capsule (50,000 Units  total) by mouth once a week. Patient not taking: Reported on 05/10/2022 08/02/20   Truitt Merle, MD  famotidine (PEPCID) 20 MG tablet Take 20 mg by mouth daily as needed for heartburn or indigestion. 10/14/21   [provider]  FLUoxetine (PROZAC) 20 MG capsule Take 20 mg by mouth daily. 10/12/21   [provider]  furosemide (LASIX) 40 MG tablet Take 1 tablet (40 mg total) by mouth daily. Patient not taking: Reported on 05/10/2022 11/26/21 05/10/22  [name], PA-C  furosemide (LASIX) 40 MG tablet Take 1 tablet (40 mg total) by mouth daily. Patient taking differently: Take 40 mg by mouth in the morning. 03/28/22     hydrALAZINE (APRESOLINE) 50 MG tablet Take 50 mg by mouth in the morning and at bedtime.    [provider]  mirtazapine (REMERON) 7.5 MG tablet Take 1 tablet (7.5 mg total) by mouth at bedtime. 12/22/20   Truitt Merle, MD  nitroGLYCERIN (NITROSTAT) 0.4 MG SL tablet DISSOLVE 1 TABLET UNDER THE TONGUE EVERY 5 MINUTES AS  NEEDED FOR CHEST PAIN. MAX  OF 3 TABLETS IN 15 MINUTES. CALL 911 IF PAIN PERSISTS. Patient taking differently: Place 0.4 mg under the tongue every 5 (five) minutes as needed for chest pain. 10/04/20   Jerline Pain, MD  ondansetron (ZOFRAN-ODT) 4 MG disintegrating tablet Take 1 tablet (4 mg total) by mouth every 8 (eight) hours as  needed for nausea or vomiting. Patient not taking: Reported on 05/10/2022 12/23/21   [name], MD  pantoprazole (PROTONIX) 40 MG tablet Take 1 tablet (40 mg total) by mouth 2 (two) times daily. 12/23/21   [name], MD  potassium chloride (KLOR-CON) 20 MEQ packet Take 1 tablet (20 mEq) by mouth daily. Patient not taking: Reported on 05/10/2022 10/31/21 11/30/21  Arrien, Jimmy Picket, MD      Allergies    Anesthetics, halogenated; Etomidate; Fentanyl; Percocet [oxycodone-acetaminophen]; Diazepam; Haloperidol lactate; Lorazepam; Morphine sulfate; Propoxyphene hcl; Tramadol hcl; Haloperidol; Morphine sulfate;  Oxycodone-acetaminophen; Propoxyphene; Tramadol hcl; and Versed [midazolam]    Review of Systems   Review of Systems  All other systems reviewed and are negative.   Physical Exam Updated Vital Signs BP (!) 192/78   Pulse 78   Temp 98.6 F (37 C) (Oral)   Resp 18   Ht '5\' 4"'$  (1.626 m)   Wt 72.6 kg   SpO2 98%   BMI 27.46 kg/m  Physical Exam Vitals and nursing note reviewed.  Constitutional:      Appearance: She is well-developed.  HENT:     Head: Atraumatic.  Eyes:     Extraocular Movements: Extraocular movements intact.     Pupils: Pupils are equal, round, and reactive to light.  Neck:     Comments: Range of motion limited Cardiovascular:     Rate and Rhythm: Normal rate.  Pulmonary:     Effort: Pulmonary effort is normal.  Musculoskeletal:     Cervical back: Neck supple. No rigidity.     Comments: Patient has tenderness with palpation of the right paraspinal region.  She also has tenderness over the scapular region with palpation.  Tenderness is reproduced when patient abducts and forward flexes her right upper extremity.  Symptoms also reproduced with patient trying to turn her head to the right side. Patient's neck range of motion is limited, but she does not have meningismus.  Skin:    General: Skin is warm and dry.  Neurological:     Mental Status: She is alert and oriented to person, place, and time.     Cranial Nerves: No cranial nerve deficit.     Sensory: No sensory deficit.     Motor: No weakness.     Coordination: Coordination normal.     ED Results / Procedures / Treatments   Labs (all labs ordered are listed, but only abnormal results are displayed) Labs Reviewed - No data to display  EKG None  Radiology VAS [place] LOWER EXTREMITY VENOUS (DVT) (7a-7p)  Result Date: 05/10/2022  Lower Venous DVT Study Patient Name:  Bradley [name] Scialdone  Date of Exam:   05/10/2022 Medical Rec #: [id number]           Accession #:    [id number] Date of Birth: 1945-07-24            Patient Gender: F Patient Age:   89 years Exam Location:  [facility] Procedure:      VAS [place] LOWER EXTREMITY VENOUS (DVT) Referring Phys: Nicki Reaper [place] --------------------------------------------------------------------------------  Indications: Edema.  Risk Factors: None identified. Comparison Study: No prior studies. Performing Technologist: Oliver Hum RVT  Examination Guidelines: A complete evaluation includes B-mode imaging, spectral Doppler, color Doppler, and power Doppler as needed of all accessible portions of each vessel. Bilateral testing is considered an integral part of a complete examination. Limited examinations for reoccurring indications may be performed as noted. The reflux portion of the exam is performed with  the patient in reverse Trendelenburg.  +-----+---------------+---------+-----------+----------+--------------+ RIGHTCompressibilityPhasicitySpontaneityPropertiesThrombus Aging +-----+---------------+---------+-----------+----------+--------------+ CFV  Full           Yes      Yes                                 +-----+---------------+---------+-----------+----------+--------------+   +---------+---------------+---------+-----------+----------+--------------+ LEFT     CompressibilityPhasicitySpontaneityPropertiesThrombus Aging +---------+---------------+---------+-----------+----------+--------------+ CFV      Full           Yes      Yes                                 +---------+---------------+---------+-----------+----------+--------------+ SFJ      Full                                                        +---------+---------------+---------+-----------+----------+--------------+ FV Prox  Full                                                        +---------+---------------+---------+-----------+----------+--------------+ FV Mid   Full           Yes      Yes                                  +---------+---------------+---------+-----------+----------+--------------+ FV DistalFull                                                        +---------+---------------+---------+-----------+----------+--------------+ PFV      Full                                                        +---------+---------------+---------+-----------+----------+--------------+ POP      Full           Yes      Yes                                 +---------+---------------+---------+-----------+----------+--------------+ PTV      Full                                                        +---------+---------------+---------+-----------+----------+--------------+ PERO     Full                                                        +---------+---------------+---------+-----------+----------+--------------+  Summary: RIGHT: - No evidence of common femoral vein obstruction.  LEFT: - There is no evidence of deep vein thrombosis in the lower extremity.  - No cystic structure found in the popliteal fossa.  *See table(s) above for measurements and observations.    Preliminary     Procedures Procedures    Medications Ordered in ED Medications  acetaminophen (TYLENOL) tablet 650 mg (650 mg Oral Given 05/11/22 1153)  tiZANidine (ZANAFLEX) tablet 4 mg (4 mg Oral Given 05/11/22 1153)    ED Course/ Medical Decision Making/ A&P                             Medical Decision Making 77 year old female with history of CKD, stroke in with chief complaint of worsening neck pain.  There is no specific trauma that has triggered this episode.  Pain has been present now for 2 days according to the daughter, but it is worse today.  Patient was seen yesterday in the ER, she was advised to return if the symptoms continued or worsen.  On exam, patient does have limitation of her neck range of motion, particularly with lateral movement to the right side.  She also has tenderness with right upper extremity  forward flexion, abduction.  She has tenderness in the scapular region and paraspinal region of her neck with palpation.  Spasms also appreciated during evaluation.  Differential diagnosis considered for severe neck pain includes meningitis, degenerative spine disease of the cervical spine, cervical sprain/strain, vertebral artery dissection.  Patient has no neurocomplaints.  She had carotid Dopplers in 2019 which showed patent vertebral arteries.  My overall suspicion for vertebral dissection is low, given the exam findings, but at the same time we do not have a preceding event that caused the current symptoms.  I discussed with the patient, that given her CKD, she cannot get CT angiogram right now, and I do not think the pretest probability for vertebral dissection is high enough to do MRI.  Instead we have decided that she will start the medications are prescribed.  We have discussed some stretching exercises, deep tissue massage, cryotherapy for her to initiate.  I also advised the patient and family to call PCP for an appointment next week.  If the symptoms resolve, then patient can discontinue the follow-up.  Otherwise she will be assessed by PCP and they can decide on advanced imaging versus physical therapy.  I also discussed return precautions with the patient and family.  They will return to the ER if she starts having worsening pain, headache, new neurologic symptoms including dizziness, vision change, speech disturbance and unilateral weakness or numbness.  Risk OTC drugs. Prescription drug management.    Final Clinical Impression(s) / ED Diagnoses Final diagnoses:  Cervical paraspinal muscle spasm    Rx / DC Orders ED Discharge Orders     None         Varney Biles, MD 05/11/22 1316

## 2022-05-11 NOTE — Discharge Instructions (Addendum)
You are seen in the ER for neck pain.  At this time, it appears that your pain is because of cervical spasms and cervical strain.  Start taking the medications that are prescribed.  Please return to the emergency room if your symptoms get worse, you start having numbness or tingling in your arms, balance issues, vision change or severe headaches.

## 2022-05-11 NOTE — ED Triage Notes (Addendum)
Pt. Stated, pt had a iron infusion yesterday at [facility] and was told if she has pain to come to ER  Pt is having rt. Sided neck pain and shoulder. Pt was seen at [facility] for hypertension and discharged.

## 2022-05-24 ENCOUNTER — Other Ambulatory Visit: Payer: Self-pay | Admitting: Hematology

## 2022-05-24 ENCOUNTER — Inpatient Hospital Stay: Payer: Medicare PPO | Attending: Hematology

## 2022-05-24 ENCOUNTER — Inpatient Hospital Stay: Payer: Medicare PPO

## 2022-05-24 ENCOUNTER — Other Ambulatory Visit: Payer: Self-pay

## 2022-05-24 VITALS — BP 156/59 | HR 73 | Temp 97.6°F | Resp 17 | Ht 64.0 in

## 2022-05-24 DIAGNOSIS — D631 Anemia in chronic kidney disease: Secondary | ICD-10-CM

## 2022-05-24 DIAGNOSIS — E538 Deficiency of other specified B group vitamins: Secondary | ICD-10-CM

## 2022-05-24 DIAGNOSIS — N184 Chronic kidney disease, stage 4 (severe): Secondary | ICD-10-CM | POA: Insufficient documentation

## 2022-05-24 DIAGNOSIS — C50311 Malignant neoplasm of lower-inner quadrant of right female breast: Secondary | ICD-10-CM

## 2022-05-24 DIAGNOSIS — D509 Iron deficiency anemia, unspecified: Secondary | ICD-10-CM

## 2022-05-24 DIAGNOSIS — D464 Refractory anemia, unspecified: Secondary | ICD-10-CM

## 2022-05-24 LAB — CMP (CANCER CENTER ONLY)
ALT: 16 U/L (ref 0–44)
AST: 16 U/L (ref 15–41)
Albumin: 3.8 g/dL (ref 3.5–5.0)
Alkaline Phosphatase: 62 U/L (ref 38–126)
Anion gap: 13 (ref 5–15)
BUN: 95 mg/dL — ABNORMAL HIGH (ref 8–23)
CO2: 21 mmol/L — ABNORMAL LOW (ref 22–32)
Calcium: 9.2 mg/dL (ref 8.9–10.3)
Chloride: 105 mmol/L (ref 98–111)
Creatinine: 3.3 mg/dL — ABNORMAL HIGH (ref 0.44–1.00)
GFR, Estimated: 14 mL/min — ABNORMAL LOW (ref >60)
Glucose, Bld: 171 mg/dL — ABNORMAL HIGH (ref 70–99)
Potassium: 3.1 mmol/L — ABNORMAL LOW (ref 3.5–5.1)
Sodium: 139 mmol/L (ref 135–145)
Total Bilirubin: 0.3 mg/dL (ref 0.3–1.2)
Total Protein: 7.5 g/dL (ref 6.5–8.1)

## 2022-05-24 LAB — CBC WITH DIFFERENTIAL (CANCER CENTER ONLY)
Abs Immature Granulocytes: 1.1 10*3/uL — ABNORMAL HIGH (ref 0.00–0.07)
Basophils Absolute: 0.1 10*3/uL (ref 0.0–0.1)
Basophils Relative: 0 %
Eosinophils Absolute: 0 10*3/uL (ref 0.0–0.5)
Eosinophils Relative: 0 %
HCT: 27.5 % — ABNORMAL LOW (ref 36.0–46.0)
Hemoglobin: 9.1 g/dL — ABNORMAL LOW (ref 12.0–15.0)
Immature Granulocytes: 6 %
Lymphocytes Relative: 6 %
Lymphs Abs: 1.1 10*3/uL (ref 0.7–4.0)
MCH: 29.6 pg (ref 26.0–34.0)
MCHC: 33.1 g/dL (ref 30.0–36.0)
MCV: 89.6 fL (ref 80.0–100.0)
Monocytes Absolute: 0.8 10*3/uL (ref 0.1–1.0)
Monocytes Relative: 5 %
Neutro Abs: 14.3 10*3/uL — ABNORMAL HIGH (ref 1.7–7.7)
Neutrophils Relative %: 83 %
Platelet Count: 319 10*3/uL (ref 150–400)
RBC: 3.07 MIL/uL — ABNORMAL LOW (ref 3.87–5.11)
RDW: 16.3 % — ABNORMAL HIGH (ref 11.5–15.5)
Smear Review: NORMAL
WBC Count: 17.3 10*3/uL — ABNORMAL HIGH (ref 4.0–10.5)
nRBC: 0.1 % (ref 0.0–0.2)

## 2022-05-24 LAB — SAMPLE TO BLOOD BANK

## 2022-05-24 LAB — FERRITIN: Ferritin: 140 ng/mL (ref 11–307)

## 2022-05-24 LAB — VITAMIN B12: Vitamin B-12: 819 pg/mL (ref 180–914)

## 2022-05-24 MED ORDER — EPOETIN ALFA-EPBX 40000 UNIT/ML IJ SOLN
40000.0000 [IU] | Freq: Once | INTRAMUSCULAR | Status: AC
  Administered 2022-05-24: 40000 [IU] via SUBCUTANEOUS
  Filled 2022-05-24: qty 1

## 2022-06-04 DIAGNOSIS — R109 Unspecified abdominal pain: Secondary | ICD-10-CM | POA: Diagnosis not present

## 2022-06-04 DIAGNOSIS — D509 Iron deficiency anemia, unspecified: Secondary | ICD-10-CM | POA: Diagnosis not present

## 2022-06-04 DIAGNOSIS — K529 Noninfective gastroenteritis and colitis, unspecified: Secondary | ICD-10-CM | POA: Diagnosis not present

## 2022-06-04 DIAGNOSIS — K8689 Other specified diseases of pancreas: Secondary | ICD-10-CM | POA: Diagnosis not present

## 2022-06-04 DIAGNOSIS — A09 Infectious gastroenteritis and colitis, unspecified: Secondary | ICD-10-CM | POA: Diagnosis not present

## 2022-06-07 ENCOUNTER — Inpatient Hospital Stay: Payer: Medicare PPO

## 2022-06-07 ENCOUNTER — Other Ambulatory Visit: Payer: Self-pay

## 2022-06-07 ENCOUNTER — Emergency Department (HOSPITAL_COMMUNITY)
Admission: EM | Admit: 2022-06-07 | Discharge: 2022-06-07 | Discharge status: Against Medical Advice | Payer: Medicare PPO | Attending: Emergency Medicine | Admitting: Emergency Medicine

## 2022-06-07 ENCOUNTER — Encounter (HOSPITAL_COMMUNITY): Payer: Self-pay

## 2022-06-07 DIAGNOSIS — R519 Headache, unspecified: Secondary | ICD-10-CM | POA: Insufficient documentation

## 2022-06-07 DIAGNOSIS — M542 Cervicalgia: Secondary | ICD-10-CM | POA: Insufficient documentation

## 2022-06-07 DIAGNOSIS — M25512 Pain in left shoulder: Secondary | ICD-10-CM | POA: Diagnosis not present

## 2022-06-07 DIAGNOSIS — Z5321 Procedure and treatment not carried out due to patient leaving prior to being seen by health care provider: Secondary | ICD-10-CM | POA: Diagnosis not present

## 2022-06-07 DIAGNOSIS — R410 Disorientation, unspecified: Secondary | ICD-10-CM | POA: Diagnosis not present

## 2022-06-07 NOTE — ED Provider Triage Note (Signed)
Emergency Medicine Provider Triage Evaluation Note  [name] , a 77 y.o. female  was evaluated in triage.  Pt complains of left-sided neck tenderness.  Patient is alert and oriented x 4.  Denies any falls or trauma.  States her neck began hurting this morning.  Left side of her neck is exquisitely painful to touch.  Review of Systems  Positive: As above Negative: As above  Physical Exam  BP (!) 185/74 (BP Location: Right Arm)   Pulse 89   Temp 98.6 F (37 C) (Oral)   Resp 18   SpO2 99%  Gen:   Awake, no distress   Resp:  Normal effort  MSK:   Moves extremities without difficulty  Other:  Light palpation of the left side of the patient's neck causes significant pain response  Medical Decision Making  Medically screening exam initiated at 1:42 PM.  Appropriate orders placed.  [name] was informed that the remainder of the evaluation will be completed by another provider, this initial triage assessment does not replace that evaluation, and the importance of remaining in the ED until their evaluation is complete.     Dorothyann Peng, PA-C 06/07/22 1345

## 2022-06-07 NOTE — ED Triage Notes (Signed)
Pt c/o left facial and left shoulder pain. Pt states she doesn't remember when the pain started. Pt's daughter states pt has been having int confusion for year and her PCP did a memory test on her. Pt denies injury. Pt has decreased ROM of left shoulder. Pt has 2+ left radial pulse, warm to touch, cap refill less than 3 sec, 4/5 left grip strength.

## 2022-06-07 NOTE — ED Notes (Signed)
Pt family member approached this NT at the sort desk and said "I am just going to check my mom out." Pt and pt family member exited the ED.

## 2022-06-21 ENCOUNTER — Other Ambulatory Visit: Payer: Self-pay

## 2022-06-21 ENCOUNTER — Inpatient Hospital Stay: Payer: Medicare HMO | Attending: Hematology

## 2022-06-21 ENCOUNTER — Inpatient Hospital Stay: Payer: Medicare HMO

## 2022-06-21 VITALS — BP 158/49 | HR 79 | Temp 98.9°F | Resp 16

## 2022-06-21 DIAGNOSIS — D464 Refractory anemia, unspecified: Secondary | ICD-10-CM

## 2022-06-21 DIAGNOSIS — D509 Iron deficiency anemia, unspecified: Secondary | ICD-10-CM

## 2022-06-21 DIAGNOSIS — Z170 Estrogen receptor positive status [ER+]: Secondary | ICD-10-CM

## 2022-06-21 DIAGNOSIS — E538 Deficiency of other specified B group vitamins: Secondary | ICD-10-CM | POA: Diagnosis not present

## 2022-06-21 DIAGNOSIS — N184 Chronic kidney disease, stage 4 (severe): Secondary | ICD-10-CM | POA: Diagnosis not present

## 2022-06-21 DIAGNOSIS — D631 Anemia in chronic kidney disease: Secondary | ICD-10-CM | POA: Diagnosis not present

## 2022-06-21 DIAGNOSIS — C50311 Malignant neoplasm of lower-inner quadrant of right female breast: Secondary | ICD-10-CM

## 2022-06-21 LAB — CBC WITH DIFFERENTIAL (CANCER CENTER ONLY)
Abs Immature Granulocytes: 0.21 10*3/uL — ABNORMAL HIGH (ref 0.00–0.07)
Basophils Absolute: 0 10*3/uL (ref 0.0–0.1)
Basophils Relative: 0 %
Eosinophils Absolute: 0 10*3/uL (ref 0.0–0.5)
Eosinophils Relative: 0 %
HCT: 29.3 % — ABNORMAL LOW (ref 36.0–46.0)
Hemoglobin: 9.6 g/dL — ABNORMAL LOW (ref 12.0–15.0)
Immature Granulocytes: 2 %
Lymphocytes Relative: 12 %
Lymphs Abs: 1.3 10*3/uL (ref 0.7–4.0)
MCH: 28.9 pg (ref 26.0–34.0)
MCHC: 32.8 g/dL (ref 30.0–36.0)
MCV: 88.3 fL (ref 80.0–100.0)
Monocytes Absolute: 0.7 10*3/uL (ref 0.1–1.0)
Monocytes Relative: 7 %
Neutro Abs: 8.1 10*3/uL — ABNORMAL HIGH (ref 1.7–7.7)
Neutrophils Relative %: 79 %
Platelet Count: 289 10*3/uL (ref 150–400)
RBC: 3.32 MIL/uL — ABNORMAL LOW (ref 3.87–5.11)
RDW: 15.6 % — ABNORMAL HIGH (ref 11.5–15.5)
WBC Count: 10.3 10*3/uL (ref 4.0–10.5)
nRBC: 0 % (ref 0.0–0.2)

## 2022-06-21 LAB — FERRITIN: Ferritin: 89 ng/mL (ref 11–307)

## 2022-06-21 LAB — SAMPLE TO BLOOD BANK

## 2022-06-21 LAB — VITAMIN B12: Vitamin B-12: 584 pg/mL (ref 180–914)

## 2022-06-21 MED ORDER — CYANOCOBALAMIN 1000 MCG/ML IJ SOLN
1000.0000 ug | Freq: Once | INTRAMUSCULAR | Status: AC
  Administered 2022-06-21: 1000 ug via INTRAMUSCULAR
  Filled 2022-06-21: qty 1

## 2022-06-21 MED ORDER — EPOETIN ALFA-EPBX 40000 UNIT/ML IJ SOLN
40000.0000 [IU] | Freq: Once | INTRAMUSCULAR | Status: AC
  Administered 2022-06-21: 40000 [IU] via SUBCUTANEOUS
  Filled 2022-06-21: qty 1

## 2022-06-21 NOTE — Patient Instructions (Signed)
Vitamin B12 Deficiency Vitamin B12 deficiency means that your body does not have enough vitamin B12. The body needs this important vitamin: To make red blood cells. To make genes (DNA). To help the nerves work. If you do not have enough vitamin B12 in your body, you can have health problems, such as not having enough red blood cells in the blood (anemia). What are the causes? Not eating enough foods that contain vitamin B12. Not being able to take in (absorb) vitamin B12 from the food that you eat. Certain diseases. A condition in which the body does not make enough of a certain protein. This results in your body not taking in enough vitamin B12. Having a surgery in which part of the stomach or small intestine is taken out. Taking medicines that make it hard for the body to take in vitamin B12. These include: Heartburn medicines. Some medicines that are used to treat diabetes. What increases the risk? Being an older adult. Eating a vegetarian or vegan diet that does not include any foods that come from animals. Not eating enough foods that contain vitamin B12 while you are pregnant. Taking certain medicines. Having alcoholism. What are the signs or symptoms? In some cases, there are no symptoms. If the condition leads to too few blood cells or nerve damage, symptoms can occur, such as: Feeling weak or tired. Not being hungry. Losing feeling (numbness) or tingling in your hands and feet. Redness and burning of the tongue. Feeling sad (depressed). Confusion or memory problems. Trouble walking. If anemia is very bad, symptoms can include: Being short of breath. Being dizzy. Having a very fast heartbeat. How is this treated? Changing the way you eat and drink, such as: Eating more foods that contain vitamin B12. Drinking little or no alcohol. Getting vitamin B12 shots. Taking vitamin B12 supplements by mouth (orally). Your doctor will tell you the dose that is best for you. Follow  these instructions at home: Eating and drinking  Eat foods that come from animals and have a lot of vitamin B12 in them. These include: Meats and poultry. This includes beef, pork, chicken, turkey, and organ meats, such as liver. Seafood, such as clams, rainbow trout, salmon, tuna, and haddock. Eggs. Dairy foods such as milk, yogurt, and cheese. Eat breakfast cereals that have vitamin B12 added to them (are fortified). Check the label. The items listed above may not be a complete list of foods and beverages you can eat and drink. Contact a dietitian for more information. Alcohol use Do not drink alcohol if: Your doctor tells you not to drink. You are pregnant, may be pregnant, or are planning to become pregnant. If you drink alcohol: Limit how much you have to: 0-1 drink a day for women. 0-2 drinks a day for men. Know how much alcohol is in your drink. In the [place] one drink equals one 12 oz bottle of beer (355 mL), one 5 oz glass of wine (148 mL), or one 1 oz glass of hard liquor (44 mL). General instructions Get any vitamin B12 shots if told by your doctor. Take supplements only as told by your doctor. Follow the directions. Keep all follow-up visits. Contact a doctor if: Your symptoms come back. Your symptoms get worse or do not get better with treatment. Get help right away if: You have trouble breathing. You have a very fast heartbeat. You have chest pain. You get dizzy. You faint. These symptoms may be an emergency. Get help right away. Call 911.   Do not wait to see if the symptoms will go away. Do not drive yourself to the hospital. Summary Vitamin B12 deficiency means that your body is not getting enough of the vitamin. In some cases, there are no symptoms of this condition. Treatment may include making a change in the way you eat and drink, getting shots, or taking supplements. Eat foods that have vitamin B12 in them. This information is not intended to replace advice  given to you by your health care provider. Make sure you discuss any questions you have with your health care provider.  Epoetin Alfa Injection What is this medication? EPOETIN ALFA (e POE e tin AL fa) treats low levels of red blood cells (anemia) caused by kidney disease, chemotherapy, or HIV medications. It can also be used in people who are at risk for blood loss during surgery. It works by helping your body make more red blood cells, which reduces the need for blood transfusions. This medicine may be used for other purposes; ask your health care provider or pharmacist if you have questions. COMMON BRAND NAME(S): Epogen, Procrit, Retacrit What should I tell my care team before I take this medication? They need to know if you have any of these conditions: Blood clots Cancer Heart disease High blood pressure On dialysis Seizures Stroke An unusual or allergic reaction to epoetin alfa, albumin, benzyl alcohol, other medications, foods, dyes, or preservatives Pregnant or trying to get pregnant Breast-feeding How should I use this medication? This medication is injected into a vein or under the skin. It is usually given by your care team in a hospital or clinic setting. It may also be given at home. If you get this medication at home, you will be taught how to prepare and give it. Use exactly as directed. Take it as directed on the prescription label at the same time every day. Keep taking it unless your care team tells you to stop. It is important that you put your used needles and syringes in a special sharps container. Do not put them in a trash can. If you do not have a sharps container, call your pharmacist or care team to get one. A special MedGuide will be given to you by the pharmacist with each prescription and refill. Be sure to read this information carefully each time. Talk to your care team about the use of this medication in children. While this medication may be used in children  as young as 1 month of age for selected conditions, precautions do apply. Overdosage: If you think you have taken too much of this medicine contact a poison control center or emergency room at once. NOTE: This medicine is only for you. Do not share this medicine with others. What if I miss a dose? If you miss a dose, take it as soon as you can. If it is almost time for your next dose, take only that dose. Do not take double or extra doses. What may interact with this medication? Darbepoetin alfa Methoxy polyethylene glycol-epoetin beta This list may not describe all possible interactions. Give your health care provider a list of all the medicines, herbs, non-prescription drugs, or dietary supplements you use. Also tell them if you smoke, drink alcohol, or use illegal drugs. Some items may interact with your medicine. What should I watch for while using this medication? Visit your care team for regular checks on your progress. Check your blood pressure as directed. Know what your blood pressure should be and when   to contact your care team. Your condition will be monitored carefully while you are receiving this medication. You may need blood work while taking this medication. What side effects may I notice from receiving this medication? Side effects that you should report to your care team as soon as possible: Allergic reactions--skin rash, itching, hives, swelling of the face, lips, tongue, or throat Blood clot--pain, swelling, or warmth in the leg, shortness of breath, chest pain Heart attack--pain or tightness in the chest, shoulders, arms, or jaw, nausea, shortness of breath, cold or clammy skin, feeling faint or lightheaded Increase in blood pressure Rash, fever, and swollen lymph nodes Redness, blistering, peeling, or loosening of the skin, including inside the mouth Seizures Stroke--sudden numbness or weakness of the face, arm, or leg, trouble speaking, confusion, trouble walking, loss of  balance or coordination, dizziness, severe headache, change in vision Side effects that usually do not require medical attention (report to your care team if they continue or are bothersome): Bone, joint, or muscle pain Cough Headache Nausea Pain, redness, or irritation at injection site This list may not describe all possible side effects. Call your doctor for medical advice about side effects. You may report side effects to FDA at 1-800-FDA-1088. Where should I keep my medication? Keep out of the reach of children and pets. Store in a refrigerator. Do not freeze. Do not shake. Protect from light. Keep this medication in the original container until you are ready to take it. See product for storage information. Get rid of any unused medication after the expiration date. To get rid of medications that are no longer needed or have expired: Take the medication to a medication take-back program. Check with your pharmacy or law enforcement to find a location. If you cannot return the medication, ask your pharmacist or care team how to get rid of the medication safely. NOTE: This sheet is a summary. It may not cover all possible information. If you have questions about this medicine, talk to your doctor, pharmacist, or health care provider.  2023 Elsevier/Gold Standard (2021-06-13 00:00:00)  Document Revised: 10/28/2020 Document Reviewed: 10/28/2020 Elsevier Patient Education  2023 [employer]  

## 2022-06-22 ENCOUNTER — Telehealth: Payer: Self-pay

## 2022-06-22 ENCOUNTER — Telehealth: Payer: Self-pay | Admitting: Hematology

## 2022-06-22 NOTE — Telephone Encounter (Signed)
Contacted patient to scheduled appointments. Left message with appointment details and a call back number if patient had any questions or could not accommodate the time we provided.   

## 2022-06-22 NOTE — Telephone Encounter (Addendum)
Forwarded message to [facility] to get these scheduled.   ----- Message from Malachy Mood, MD sent at 06/21/2022 10:36 PM EDT ----- Please schedule Iv venofer 200mg  X3 or 300mg  X2 with each retacrit injection and let pt know, thanks  Malachy Mood

## 2022-06-26 ENCOUNTER — Other Ambulatory Visit: Payer: Self-pay

## 2022-06-26 ENCOUNTER — Telehealth: Payer: Self-pay

## 2022-06-26 NOTE — Telephone Encounter (Signed)
Spoke with pt via telephone regarding recent lab results.  Informed pt that Dr. Mosetta Putt would like for the pt to get IV Iron on the same days as her Retacrit injection due to pt's iron is low.  Informed pt that Dr. Mosetta Putt would like for the pt to get 3 doses of IV Iron.  Pt verbalized understanding and had no further questions or concerns.   Sent scheduling message to Lynda Rainwater in Scheduling to contact pt to get her scheduled for 3rd dose of IV Iron infusion.

## 2022-06-30 ENCOUNTER — Encounter: Payer: Self-pay | Admitting: Hematology

## 2022-07-05 ENCOUNTER — Inpatient Hospital Stay: Payer: Medicare HMO

## 2022-07-05 DIAGNOSIS — E1169 Type 2 diabetes mellitus with other specified complication: Secondary | ICD-10-CM | POA: Diagnosis not present

## 2022-07-05 DIAGNOSIS — I5032 Chronic diastolic (congestive) heart failure: Secondary | ICD-10-CM | POA: Diagnosis not present

## 2022-07-05 DIAGNOSIS — N185 Chronic kidney disease, stage 5: Secondary | ICD-10-CM | POA: Diagnosis not present

## 2022-07-05 DIAGNOSIS — E1122 Type 2 diabetes mellitus with diabetic chronic kidney disease: Secondary | ICD-10-CM | POA: Diagnosis not present

## 2022-07-05 DIAGNOSIS — I120 Hypertensive chronic kidney disease with stage 5 chronic kidney disease or end stage renal disease: Secondary | ICD-10-CM | POA: Diagnosis not present

## 2022-07-05 DIAGNOSIS — N2581 Secondary hyperparathyroidism of renal origin: Secondary | ICD-10-CM | POA: Diagnosis not present

## 2022-07-05 DIAGNOSIS — E785 Hyperlipidemia, unspecified: Secondary | ICD-10-CM | POA: Diagnosis not present

## 2022-07-06 DIAGNOSIS — N185 Chronic kidney disease, stage 5: Secondary | ICD-10-CM | POA: Diagnosis not present

## 2022-07-06 DIAGNOSIS — M1850 Other unilateral secondary osteoarthritis of first carpometacarpal joint, unspecified hand: Secondary | ICD-10-CM | POA: Diagnosis not present

## 2022-07-09 ENCOUNTER — Other Ambulatory Visit (HOSPITAL_COMMUNITY): Payer: Self-pay

## 2022-07-09 ENCOUNTER — Encounter: Payer: Self-pay | Admitting: Hematology

## 2022-07-16 ENCOUNTER — Other Ambulatory Visit (HOSPITAL_COMMUNITY): Payer: Self-pay

## 2022-07-19 ENCOUNTER — Inpatient Hospital Stay: Payer: Medicare HMO | Attending: Hematology | Admitting: Hematology

## 2022-07-19 ENCOUNTER — Other Ambulatory Visit: Payer: Self-pay

## 2022-07-19 ENCOUNTER — Inpatient Hospital Stay: Payer: Medicare HMO

## 2022-07-19 ENCOUNTER — Encounter: Payer: Self-pay | Admitting: Hematology

## 2022-07-19 VITALS — BP 154/65 | HR 76 | Temp 98.7°F | Resp 18 | Ht 64.0 in | Wt 162.2 lb

## 2022-07-19 VITALS — BP 161/63 | HR 71 | Resp 16

## 2022-07-19 DIAGNOSIS — E538 Deficiency of other specified B group vitamins: Secondary | ICD-10-CM

## 2022-07-19 DIAGNOSIS — N184 Chronic kidney disease, stage 4 (severe): Secondary | ICD-10-CM

## 2022-07-19 DIAGNOSIS — D519 Vitamin B12 deficiency anemia, unspecified: Secondary | ICD-10-CM | POA: Insufficient documentation

## 2022-07-19 DIAGNOSIS — D509 Iron deficiency anemia, unspecified: Secondary | ICD-10-CM

## 2022-07-19 DIAGNOSIS — D518 Other vitamin B12 deficiency anemias: Secondary | ICD-10-CM | POA: Diagnosis not present

## 2022-07-19 DIAGNOSIS — D631 Anemia in chronic kidney disease: Secondary | ICD-10-CM | POA: Insufficient documentation

## 2022-07-19 DIAGNOSIS — C50311 Malignant neoplasm of lower-inner quadrant of right female breast: Secondary | ICD-10-CM | POA: Diagnosis not present

## 2022-07-19 DIAGNOSIS — Z170 Estrogen receptor positive status [ER+]: Secondary | ICD-10-CM | POA: Diagnosis not present

## 2022-07-19 DIAGNOSIS — D464 Refractory anemia, unspecified: Secondary | ICD-10-CM

## 2022-07-19 LAB — CBC WITH DIFFERENTIAL (CANCER CENTER ONLY)
Abs Immature Granulocytes: 0.05 10*3/uL (ref 0.00–0.07)
Basophils Absolute: 0 10*3/uL (ref 0.0–0.1)
Basophils Relative: 0 %
Eosinophils Absolute: 0.1 10*3/uL (ref 0.0–0.5)
Eosinophils Relative: 1 %
HCT: 29.3 % — ABNORMAL LOW (ref 36.0–46.0)
Hemoglobin: 9.6 g/dL — ABNORMAL LOW (ref 12.0–15.0)
Immature Granulocytes: 1 %
Lymphocytes Relative: 17 %
Lymphs Abs: 1.3 10*3/uL (ref 0.7–4.0)
MCH: 29 pg (ref 26.0–34.0)
MCHC: 32.8 g/dL (ref 30.0–36.0)
MCV: 88.5 fL (ref 80.0–100.0)
Monocytes Absolute: 0.5 10*3/uL (ref 0.1–1.0)
Monocytes Relative: 7 %
Neutro Abs: 5.6 10*3/uL (ref 1.7–7.7)
Neutrophils Relative %: 74 %
Platelet Count: 262 10*3/uL (ref 150–400)
RBC: 3.31 MIL/uL — ABNORMAL LOW (ref 3.87–5.11)
RDW: 15.4 % (ref 11.5–15.5)
WBC Count: 7.5 10*3/uL (ref 4.0–10.5)
nRBC: 0 % (ref 0.0–0.2)

## 2022-07-19 LAB — FERRITIN: Ferritin: 68 ng/mL (ref 11–307)

## 2022-07-19 LAB — VITAMIN B12: Vitamin B-12: 917 pg/mL — ABNORMAL HIGH (ref 180–914)

## 2022-07-19 LAB — SAMPLE TO BLOOD BANK

## 2022-07-19 MED ORDER — SODIUM CHLORIDE 0.9 % IV SOLN
300.0000 mg | Freq: Once | INTRAVENOUS | Status: AC
  Administered 2022-07-19: 300 mg via INTRAVENOUS
  Filled 2022-07-19: qty 300

## 2022-07-19 NOTE — Progress Notes (Signed)
[facility]   [phone] [phone]   Clinic Follow up Note   Patient Care Team: Renford Dills, MD as PCP - General (Internal Medicine) Wendall Stade, MD as PCP - Cardiology (Cardiology) Blair Promise, OD as Consulting Physician (Optometry) Almond Lint, MD as Consulting Physician (General Surgery) Malachy Mood, MD as Consulting Physician (Hematology) Bufford Buttner, MD as Consulting Physician (Nephrology)  Date of Service:  07/19/2022  CHIEF COMPLAINT: f/u of anemia of chronic disease, h/o right breast cancer   CURRENT THERAPY:   -Retacrit injection starting 09/2018, currently q2weeks, hold if Hg>10.5 -B12 injection starting 05/04/19, currently q6weeks -Oral Ferrous sulfate daily  -IV Feraheme as needed starting 09/15/18. Last on 12/21/19  ASSESSMENT:  [name] is a 77 y.o. female with   Breast cancer of lower-inner quadrant of right female breast (HCC) invasive ductal carcinoma with DCIS, pT1bN0Mo, stage IA,   ER+ and PR +, HER 2 negative -diagnosed 02/2013, s/p right lumpectomy, adjuvant radiation and Anastrozole for 4 years until 07/2017.  Anastrozole was stopped after she developed septic shock from pneumonia and a cardiac arrest, and we decided not restart due to the risk of cardiac toxicity. -Her 07/11/20 Mammogram showed suspicious right breast mass. She underwent breast biopsy on 07/28/20 which showed fibroadipose and necrosis, no malignancy.  -she is clinically doing well.  She is overdue for mammogram, I ordered for her today, and encouraged her to call breast center to get it scheduled.  B12 deficiency anemia Started receiving monthly B12 injections in our clinic on 05/04/19, currently on every 6 weeks    Anemia of chronic kidney failure, stage 4 (severe) (HCC) -Her insurance did not approve Aranesp so she was switched to Retacrit every 2 weeks on 11/14/18. Goal is Hg 9-11. Due to worsened anemia, her dose has been increased,  currently at 40,000 every 4 weeks.     Iron deficiency anemia -Probably secondary to GI bleeding, also endoscopy was negative. -Her previous bone marrow biopsy in 2020 was negative. -She has been receiving IV iron as needed for ferritin less than 100.      PLAN: -lab reviewed hg 9.6 -I order Mammogram 5/22 -Iv Iron as scheduled, will give Venofer 300 mg twice -continue Retacrit injection q2 weeks -b12 injections q6 wks -lab and f/u in 3 months  SUMMARY OF ONCOLOGIC HISTORY: Oncology History Overview Note  Cancer Staging Breast cancer of lower-inner quadrant of right female breast (HCC) Staging form: Breast, AJCC 7th Edition - Clinical: Stage IA (T1b, N0, cM0) - Unsigned Staging comments: Staged at breast conference 03/25/13.  - Pathologic stage from 05/12/2013: Stage IA (T1b, N0, cM0) - Signed by Malachy Mood, MD on 06/21/2016     Breast cancer of lower-inner quadrant of right female breast (HCC)  02/27/2013 Mammogram   Diagnostic mammogram and ultrasound showed a 6 x 5 mm mass at 5:00 position. Axilla was negative by ultrasound.   03/16/2013 Initial Biopsy   Right breast 5:00 position biopsy showed invasive ductal carcinoma, low-grade   03/16/2013 Receptors her2   ER100% positive, PR 100% positive, HER-2 negative, Ki-67 20%   03/18/2013 Initial Diagnosis   Breast cancer of lower-inner quadrant of right female breast (HCC)   05/12/2013 Surgery   Right breast lumpectomy with sentinel lymph node biopsy   05/12/2013 Pathology Results   Right breast lumpectomy showed invasive ductal carcinoma, grade 2, 0.9 cm, 6 sentinel lymph nodes were all negative. Margins were negative.   05/12/2013 Oncotype testing   Oncotype recurrence  score 19, intermediate risk, 10 year risk of distant recurrence 12% with tamoxifen for 5 years. Patient declined adjuvant chemotherapy.   06/02/2013 - 07/16/2013 Radiation Therapy   Adjuvant breast radiation with boost 1600Gy   08/06/2013 - 07/2017  Anti-estrogen oral therapy   Arimidex 1 mg daily. Stopped in 07/2017 due to acute illness    03/22/2017 Mammogram   IMPRESSION: No mammographic evidence for malignancy.   Iron deficiency anemia     INTERVAL HISTORY:  [name] is here for a follow up of 3 She was last seen by anemia of chronic disease, h/o right breast cancer me on 04/26/2022 She presents to the clinic alone. Pt state that she has no issues. Pt is overall doing well.      All other systems were reviewed with the patient and are negative.  MEDICAL HISTORY:  Past Medical History:  Diagnosis Date   Abdominal discomfort    Chronic N/V/D. Presumptive dx Crohn's dx per elevated p ANCA. Failed Entocort and Pentasa. Sep 2003 - ileocolectomy c anastomosis per Dr Orson Slick 2/2 adhesions - path was hegative for Crohns. EGD, Sm bowel follow through (11/03), and an eteroclysis (10/03) were unrevealing. Cuases hypomag and hypocalcemia.   Adnexal mass 8/03   s/p lap BSO (R ovarian fibroma) & lysis of adhesions   Allergy    Seasonal   Anemia    Multifactorial. Baseline HgB 10-11 ish. B12 def - 150 in 3/10. Fe Def - ferritin 35 3/10. Both are being repleted.   Breast cancer (HCC) 03/16/13   right, 5 o'clock   CAD (coronary artery disease) 1996   1996 - PTCA and angioplasty diagonal branch. 2000 - Rotoblator & angiopllasty of diagonal. 2006 - subendocardial AMI, DES to proximal LAD.Marland Kitchen Also had 90% stenosis in distal apical LAD. EF 55 with apical hypokinesis. Indefinite ASA and Plavix.   CHF (congestive heart failure) (HCC)    Chronic kidney disease    Chronic renal insuff baseline Cr 1.2 - 1.4 ish.   Chronic kidney disease, stage III (moderate) (HCC)    Chronic renal insuff baseline Cr 1.2 - 1.4 ish. New baseline about 2 (2018)   Chronic pain    CT 10/10 = Spinal stenosis L2 - S1.   COVID-19 11/02/2020   Diabetes mellitus    Insulin dependent   Diabetes mellitus type 2 in obese 05/03/2010   Managed on lantus and novolog. Has  diabetic nephropathy. Metformin D/C'd 2012 2/2 creatinine.  No diabetic retinopathy per 3/11.    Gout    Hx of radiation therapy 06/02/13- 07/16/13   right rbeast 4500 cGy 25 sessions, right breast boost 1600 cGy in 8 sessions   Hyperlipidemia    Managed with both a statin and Welchol. Welchol stopped 2014 2/2 cost and started on fenofibrate    Hypertension    2006 B renal arteries patent. 2003 MRA - no RAS. 2003 pheo W/U Dr Caryn Section reportedly negative.   Hypoxia 07/23/2017   Lupus (HCC)    Lymphedema of breast    Personal history of radiation therapy 2015   Pneumonia of left lung due to infectious organism    RBBB    Renal vein thrombosis (HCC)    SBO (small bowel obstruction) (HCC) 09/17/2017   Secondary hyperparathyroidism (HCC)    Stroke (HCC)    Incidental finding MRI 2002 L lacunar infarct   Vitamin B12 deficiency    Vitamin D deficiency    Wears dentures    top    SURGICAL HISTORY:  Past Surgical History:  Procedure Laterality Date   ABDOMINAL HYSTERECTOMY     BILATERAL SALPINGOOPHORECTOMY  8/03   Lap BSO (R ovarian fibroma) and adhesion lysis   BIOPSY  09/08/2020   Procedure: BIOPSY;  Surgeon: Bernette Redbird, MD;  Location: [facility];  Service: Endoscopy;;   BOWEL RESECTION  2003   ileocolectomy with anastomosis 2/2 adhesions   BREAST BIOPSY Right 2015   BREAST BIOPSY Right 07/2014   BREAST LUMPECTOMY Right 04/22/2013   BREAST LUMPECTOMY WITH NEEDLE LOCALIZATION AND AXILLARY SENTINEL LYMPH NODE BX Right 04/22/2013   Procedure: BREAST LUMPECTOMY WITH NEEDLE LOCALIZATION AND AXILLARY SENTINEL LYMPH NODE BX;  Surgeon: Almond Lint, MD;  Location: [facility];  Service: General;  Laterality: Right;   CARDIAC CATHETERIZATION     2 stents   CHOLECYSTECTOMY     COLONOSCOPY     ESOPHAGOGASTRODUODENOSCOPY N/A 09/08/2020   Procedure: ESOPHAGOGASTRODUODENOSCOPY (EGD);  Surgeon: Bernette Redbird, MD;  Location: [facility];  Service: Endoscopy;  Laterality: N/A;    HEMICOLECTOMY     R sided hemicolectomy   HERNIA REPAIR     Ventral hernia repair   PTCA  4/06    I have reviewed the social history and family history with the patient and they are unchanged from previous note.  ALLERGIES:  is allergic to anesthetics, halogenated; etomidate; fentanyl; percocet [oxycodone-acetaminophen]; diazepam; haloperidol lactate; lorazepam; morphine sulfate; propoxyphene hcl; tramadol hcl; haloperidol; morphine sulfate; oxycodone-acetaminophen; propoxyphene; tramadol hcl; and versed [midazolam].  MEDICATIONS:  Current Outpatient Medications  Medication Sig Dispense Refill   acetaminophen (TYLENOL) 500 MG tablet Take 1 tablet (500 mg total) by mouth every 6 (six) hours as needed. 30 tablet 0   amLODipine (NORVASC) 10 MG tablet Take 1 tablet (10 mg total) by mouth daily. 90 tablet 1   apixaban (ELIQUIS) 2.5 MG TABS tablet Take 1 tablet (2.5 mg total) by mouth 2 (two) times daily. 60 tablet 0   budesonide (ENTOCORT EC) 3 MG 24 hr capsule Take 1 capsule by mouth once daily (Patient taking differently: Take 3 mg by mouth 3 (three) times daily.) 30 capsule 0   ergocalciferol (VITAMIN D2) 1.25 MG (50000 UT) capsule Take 1 capsule (50,000 Units total) by mouth once a week. (Patient not taking: Reported on 05/10/2022) 4 capsule 1   famotidine (PEPCID) 20 MG tablet Take 20 mg by mouth daily as needed for heartburn or indigestion.     FLUoxetine (PROZAC) 20 MG capsule Take 20 mg by mouth daily.     furosemide (LASIX) 40 MG tablet Take 1 tablet (40 mg total) by mouth daily. (Patient not taking: Reported on 05/10/2022) 30 tablet 0   furosemide (LASIX) 40 MG tablet Take 1 tablet (40 mg total) by mouth daily. (Patient taking differently: Take 40 mg by mouth in the morning.) 90 tablet 3   hydrALAZINE (APRESOLINE) 50 MG tablet Take 50 mg by mouth in the morning and at bedtime.     mirtazapine (REMERON) 7.5 MG tablet Take 1 tablet (7.5 mg total) by mouth at bedtime. 90 tablet 1    nitroGLYCERIN (NITROSTAT) 0.4 MG SL tablet DISSOLVE 1 TABLET UNDER THE TONGUE EVERY 5 MINUTES AS  NEEDED FOR CHEST PAIN. MAX  OF 3 TABLETS IN 15 MINUTES. CALL 911 IF PAIN PERSISTS. (Patient taking differently: Place 0.4 mg under the tongue every 5 (five) minutes as needed for chest pain.) 25 tablet 0   ondansetron (ZOFRAN-ODT) 4 MG disintegrating tablet Take 1 tablet (4 mg total) by mouth every 8 (eight)  hours as needed for nausea or vomiting. (Patient not taking: Reported on 05/10/2022) 20 tablet 0   pantoprazole (PROTONIX) 40 MG tablet Take 1 tablet (40 mg total) by mouth 2 (two) times daily. 30 tablet 0   potassium chloride (KLOR-CON) 20 MEQ packet Take 1 tablet (20 mEq) by mouth daily. (Patient not taking: Reported on 05/10/2022) 30 packet 0   tiZANidine (ZANAFLEX) 4 MG capsule Take 1 capsule (4 mg total) by mouth 3 (three) times daily. 15 capsule 0   No current facility-administered medications for this visit.    PHYSICAL EXAMINATION: ECOG PERFORMANCE STATUS: 1 - Symptomatic but completely ambulatory  Vitals:   07/19/22 1329  BP: (!) 154/65  Pulse: 76  Resp: 18  Temp: 98.7 F (37.1 C)  SpO2: 100%   Wt Readings from Last 3 Encounters:  07/19/22 162 lb 3.2 oz (73.6 kg)  06/07/22 160 lb 0.9 oz (72.6 kg)  05/11/22 160 lb (72.6 kg)      GENERAL:alert, no distress and comfortable SKIN: skin color normal, no rashes or significant lesions EYES: normal, Conjunctiva are pink and non-injected, sclera clear  NEURO: alert & oriented x 3 with fluent speech  LABORATORY DATA:  I have reviewed the data as listed    Latest Ref Rng & Units 07/19/2022   12:57 PM 06/21/2022    2:28 PM 05/24/2022    2:05 PM  CBC  WBC 4.0 - 10.5 K/uL 7.5  10.3  17.3   Hemoglobin 12.0 - 15.0 g/dL 9.6  9.6  9.1   Hematocrit 36.0 - 46.0 % 29.3  29.3  27.5   Platelets 150 - 400 K/uL 262  289  319         Latest Ref Rng & Units 05/24/2022    2:05 PM 05/10/2022    6:14 PM 02/16/2022    1:21 PM  CMP  Glucose 70 - 99  mg/dL 161  096  045   BUN 8 - 23 mg/dL 95  43  92   Creatinine 0.44 - 1.00 mg/dL 4.09  8.11  9.14   Sodium 135 - 145 mmol/L 139  136  139   Potassium 3.5 - 5.1 mmol/L 3.1  3.6  3.9   Chloride 98 - 111 mmol/L 105  106  101   CO2 22 - 32 mmol/L 21  19  28    Calcium 8.9 - 10.3 mg/dL 9.2  9.0  9.5   Total Protein 6.5 - 8.1 g/dL 7.5  7.9  8.0   Total Bilirubin 0.3 - 1.2 mg/dL 0.3  0.5  0.3   Alkaline Phos 38 - 126 U/L 62  64  86   AST 15 - 41 U/L 16  12  15    ALT 0 - 44 U/L 16  11  18        RADIOGRAPHIC STUDIES: I have personally reviewed the radiological images as listed and agreed with the findings in the report. No results found.    Orders Placed This Encounter  Procedures   MM Digital Screening    Standing Status:   Future    Standing Expiration Date:   07/19/2023    Order Specific Question:   Reason for Exam (SYMPTOM  OR DIAGNOSIS REQUIRED)    Answer:   routine screening    Order Specific Question:   Preferred imaging location?    Answer:   [facility]    Order Specific Question:   Release to patient    Answer:   Immediate  Ferritin    Standing Status:   Standing    Number of Occurrences:   20    Standing Expiration Date:   07/19/2023   All questions were answered. The patient knows to call the clinic with any problems, questions or concerns. No barriers to learning was detected. The total time spent in the appointment was 25 minutes.     Malachy Mood, MD 07/19/2022   Carolin Coy, CMA, am acting as scribe for Malachy Mood, MD.   I have reviewed the above documentation for accuracy and completeness, and I agree with the above.

## 2022-07-19 NOTE — Assessment & Plan Note (Signed)
invasive ductal carcinoma with DCIS, pT1bN0Mo, stage IA,   ER+ and PR +, HER 2 negative -diagnosed 02/2013, s/p right lumpectomy, adjuvant radiation and Anastrozole for 4 years until 07/2017.  Anastrozole was stopped after she developed septic shock from pneumonia and a cardiac arrest, and we decided not restart due to the risk of cardiac toxicity. -Her 07/11/20 Mammogram showed suspicious right breast mass. She underwent breast biopsy on 07/28/20 which showed fibroadipose and necrosis, no malignancy. This area is palpable on exam today. We will continue to monitor. -she is clinically doing well.  

## 2022-07-19 NOTE — Assessment & Plan Note (Signed)
-  Her insurance did not approve Aranesp so she was switched to Retacrit every 2 weeks on 11/14/18. Goal is Hg 9-11. Due to worsened anemia, her dose has been increased, currently at 40,000 every 4 weeks.  

## 2022-07-19 NOTE — Patient Instructions (Signed)

## 2022-07-19 NOTE — Progress Notes (Signed)
Per Dr Mosetta Putt, no injections today. Venofer dose increased to 300mg .

## 2022-07-19 NOTE — Assessment & Plan Note (Signed)
-  Probably secondary to GI bleeding, also endoscopy was negative. -Her previous bone marrow biopsy in 2020 was negative. -She has been receiving IV iron as needed for ferritin less than 100. 

## 2022-07-19 NOTE — Assessment & Plan Note (Signed)
Started receiving monthly B12 injections in our clinic on 05/04/19, currently on every 6 weeks   

## 2022-07-20 ENCOUNTER — Encounter: Payer: Self-pay | Admitting: Hematology

## 2022-07-20 ENCOUNTER — Telehealth: Payer: Self-pay | Admitting: Hematology

## 2022-07-20 NOTE — Telephone Encounter (Signed)
Contacted patient to scheduled appointments. Patient is aware of appointments that are scheduled.   

## 2022-07-25 ENCOUNTER — Telehealth: Payer: Self-pay | Admitting: Hematology

## 2022-07-26 ENCOUNTER — Inpatient Hospital Stay: Payer: Medicare HMO

## 2022-07-26 VITALS — BP 163/60 | HR 77 | Temp 99.1°F | Resp 16

## 2022-07-26 DIAGNOSIS — D509 Iron deficiency anemia, unspecified: Secondary | ICD-10-CM

## 2022-07-26 DIAGNOSIS — N184 Chronic kidney disease, stage 4 (severe): Secondary | ICD-10-CM | POA: Diagnosis not present

## 2022-07-26 DIAGNOSIS — D631 Anemia in chronic kidney disease: Secondary | ICD-10-CM

## 2022-07-26 DIAGNOSIS — D519 Vitamin B12 deficiency anemia, unspecified: Secondary | ICD-10-CM | POA: Diagnosis not present

## 2022-07-26 DIAGNOSIS — D464 Refractory anemia, unspecified: Secondary | ICD-10-CM

## 2022-07-26 LAB — CBC WITH DIFFERENTIAL (CANCER CENTER ONLY)
Abs Immature Granulocytes: 0.07 10*3/uL (ref 0.00–0.07)
Basophils Absolute: 0 10*3/uL (ref 0.0–0.1)
Basophils Relative: 0 %
Eosinophils Absolute: 0 10*3/uL (ref 0.0–0.5)
Eosinophils Relative: 0 %
HCT: 30.8 % — ABNORMAL LOW (ref 36.0–46.0)
Hemoglobin: 10 g/dL — ABNORMAL LOW (ref 12.0–15.0)
Immature Granulocytes: 1 %
Lymphocytes Relative: 11 %
Lymphs Abs: 0.9 10*3/uL (ref 0.7–4.0)
MCH: 28.2 pg (ref 26.0–34.0)
MCHC: 32.5 g/dL (ref 30.0–36.0)
MCV: 86.8 fL (ref 80.0–100.0)
Monocytes Absolute: 0.6 10*3/uL (ref 0.1–1.0)
Monocytes Relative: 7 %
Neutro Abs: 6.7 10*3/uL (ref 1.7–7.7)
Neutrophils Relative %: 81 %
Platelet Count: 249 10*3/uL (ref 150–400)
RBC: 3.55 MIL/uL — ABNORMAL LOW (ref 3.87–5.11)
RDW: 15.1 % (ref 11.5–15.5)
WBC Count: 8.4 10*3/uL (ref 4.0–10.5)
nRBC: 0 % (ref 0.0–0.2)

## 2022-07-26 LAB — FERRITIN: Ferritin: 457 ng/mL — ABNORMAL HIGH (ref 11–307)

## 2022-07-26 LAB — SAMPLE TO BLOOD BANK

## 2022-07-26 MED ORDER — EPOETIN ALFA-EPBX 40000 UNIT/ML IJ SOLN
40000.0000 [IU] | Freq: Once | INTRAMUSCULAR | Status: AC
  Administered 2022-07-26: 40000 [IU] via SUBCUTANEOUS
  Filled 2022-07-26: qty 1

## 2022-07-26 MED ORDER — CYANOCOBALAMIN 1000 MCG/ML IJ SOLN
1000.0000 ug | Freq: Once | INTRAMUSCULAR | Status: AC
  Administered 2022-07-26: 1000 ug via INTRAMUSCULAR
  Filled 2022-07-26: qty 1

## 2022-07-26 NOTE — Patient Instructions (Addendum)
Vitamin B12 Deficiency Vitamin B12 deficiency occurs when the body does not have enough of this important vitamin. The body needs this vitamin: To make red blood cells. To make DNA. This is the genetic material inside cells. To help the nerves work properly so they can carry messages from the brain to the body. Vitamin B12 deficiency can cause health problems, such as not having enough red blood cells in the blood (anemia). This can lead to nerve damage if untreated. What are the causes? This condition may be caused by: Not eating enough foods that contain vitamin B12. Not having enough stomach acid and digestive fluids to properly absorb vitamin B12 from the food that you eat. Having certain diseases that make it hard to absorb vitamin B12. These diseases include Crohn's disease, chronic pancreatitis, and cystic fibrosis. An autoimmune disorder in which the body does not make enough of a protein (intrinsic factor) within the stomach, resulting in not enough absorption of vitamin B12. Having a surgery in which part of the stomach or small intestine is removed. Taking certain medicines that make it hard for the body to absorb vitamin B12. These include: Heartburn medicines, such as antacids and proton pump inhibitors. Some medicines that are used to treat diabetes. What increases the risk? The following factors may make you more likely to develop a vitamin B12 deficiency: Being an older adult. Eating a vegetarian or vegan diet that does not include any foods that come from animals. Eating a poor diet while you are pregnant. Taking certain medicines. Having alcoholism. What are the signs or symptoms? In some cases, there are no symptoms of this condition. If the condition leads to anemia or nerve damage, various symptoms may occur, such as: Weakness. Tiredness (fatigue). Loss of appetite. Numbness or tingling in your hands and feet. Redness and burning of the tongue. Depression,  confusion, or memory problems. Trouble walking. If anemia is severe, symptoms can include: Shortness of breath. Dizziness. Rapid heart rate. How is this diagnosed? This condition may be diagnosed with a blood test to measure the level of vitamin B12 in your blood. You may also have other tests, including: A group of tests that measure certain characteristics of blood cells (complete blood count, CBC). A blood test to measure intrinsic factor. A procedure where a thin tube with a camera on the end is used to look into your stomach or intestines (endoscopy). Other tests may be needed to discover the cause of the deficiency. How is this treated? Treatment for this condition depends on the cause. This condition may be treated by: Changing your eating and drinking habits, such as: Eating more foods that contain vitamin B12. Drinking less alcohol or no alcohol. Getting vitamin B12 injections. Taking vitamin B12 supplements by mouth (orally). Your health care provider will tell you which dose is best for you. Follow these instructions at home: Eating and drinking  Include foods in your diet that come from animals and contain a lot of vitamin B12. These include: Meats and poultry. This includes beef, pork, chicken, turkey, and organ meats, such as liver. Seafood. This includes clams, rainbow trout, salmon, tuna, and haddock. Eggs. Dairy foods such as milk, yogurt, and cheese. Eat foods that have vitamin B12 added to them (are fortified), such as ready-to-eat breakfast cereals. Check the label on the package to see if a food is fortified. The items listed above may not be a complete list of foods and beverages you can eat and drink. Contact a dietitian for   more information. Alcohol use Do not drink alcohol if: Your health care provider tells you not to drink. You are pregnant, may be pregnant, or are planning to become pregnant. If you drink alcohol: Limit how much you have to: 0-1 drink a  day for women. 0-2 drinks a day for men. Know how much alcohol is in your drink. In the [place] one drink equals one 12 oz bottle of beer (355 mL), one 5 oz glass of wine (148 mL), or one 1 oz glass of hard liquor (44 mL). General instructions Get vitamin B12 injections if told to by your health care provider. Take supplements only as told by your health care provider. Follow the directions carefully. Keep all follow-up visits. This is important. Contact a health care provider if: Your symptoms come back. Your symptoms get worse or do not improve with treatment. Get help right away: You develop shortness of breath. You have a rapid heart rate. You have chest pain. You become dizzy or you faint. These symptoms may be an emergency. Get help right away. Call 911. Do not wait to see if the symptoms will go away. Do not drive yourself to the hospital. Summary Vitamin B12 deficiency occurs when the body does not have enough of this important vitamin. Common causes include not eating enough foods that contain vitamin B12, not being able to absorb vitamin B12 from the food that you eat, having a surgery in which part of the stomach or small intestine is removed, or taking certain medicines. Eat foods that have vitamin B12 in them. Treatment may include making a change in the way you eat and drink, getting vitamin B12 injections, or taking vitamin B12 supplements. This information is not intended to replace advice given to you by your health care provider. Make sure you discuss any questions you have with your health care provider. Document Revised: 10/28/2020 Document Reviewed: 10/28/2020 Elsevier Patient Education  2023 [employer] Epoetin Alfa Injection What is this medication? EPOETIN ALFA (e POE e tin AL fa) treats low levels of red blood cells (anemia) caused by kidney disease, chemotherapy, or HIV medications. It can also be used in people who are at risk for blood loss during surgery. It  works by helping your body make more red blood cells, which reduces the need for blood transfusions. This medicine may be used for other purposes; ask your health care provider or pharmacist if you have questions. COMMON BRAND NAME(S): Epogen, Procrit, Retacrit What should I tell my care team before I take this medication? They need to know if you have any of these conditions: Blood clots Cancer Heart disease High blood pressure On dialysis Seizures Stroke An unusual or allergic reaction to epoetin alfa, albumin, benzyl alcohol, other medications, foods, dyes, or preservatives Pregnant or trying to get pregnant Breast-feeding How should I use this medication? This medication is injected into a vein or under the skin. It is usually given by your care team in a hospital or clinic setting. It may also be given at home. If you get this medication at home, you will be taught how to prepare and give it. Use exactly as directed. Take it as directed on the prescription label at the same time every day. Keep taking it unless your care team tells you to stop. It is important that you put your used needles and syringes in a special sharps container. Do not put them in a trash can. If you do not have a sharps container, call   your pharmacist or care team to get one. A special MedGuide will be given to you by the pharmacist with each prescription and refill. Be sure to read this information carefully each time. Talk to your care team about the use of this medication in children. While this medication may be used in children as young as 1 month of age for selected conditions, precautions do apply. Overdosage: If you think you have taken too much of this medicine contact a poison control center or emergency room at once. NOTE: This medicine is only for you. Do not share this medicine with others. What if I miss a dose? If you miss a dose, take it as soon as you can. If it is almost time for your next dose,  take only that dose. Do not take double or extra doses. What may interact with this medication? Darbepoetin alfa Methoxy polyethylene glycol-epoetin beta This list may not describe all possible interactions. Give your health care provider a list of all the medicines, herbs, non-prescription drugs, or dietary supplements you use. Also tell them if you smoke, drink alcohol, or use illegal drugs. Some items may interact with your medicine. What should I watch for while using this medication? Visit your care team for regular checks on your progress. Check your blood pressure as directed. Know what your blood pressure should be and when to contact your care team. Your condition will be monitored carefully while you are receiving this medication. You may need blood work while taking this medication. What side effects may I notice from receiving this medication? Side effects that you should report to your care team as soon as possible: Allergic reactions--skin rash, itching, hives, swelling of the face, lips, tongue, or throat Blood clot--pain, swelling, or warmth in the leg, shortness of breath, chest pain Heart attack--pain or tightness in the chest, shoulders, arms, or jaw, nausea, shortness of breath, cold or clammy skin, feeling faint or lightheaded Increase in blood pressure Rash, fever, and swollen lymph nodes Redness, blistering, peeling, or loosening of the skin, including inside the mouth Seizures Stroke--sudden numbness or weakness of the face, arm, or leg, trouble speaking, confusion, trouble walking, loss of balance or coordination, dizziness, severe headache, change in vision Side effects that usually do not require medical attention (report to your care team if they continue or are bothersome): Bone, joint, or muscle pain Cough Headache Nausea Pain, redness, or irritation at injection site This list may not describe all possible side effects. Call your doctor for medical advice about  side effects. You may report side effects to FDA at 1-800-FDA-1088. Where should I keep my medication? Keep out of the reach of children and pets. Store in a refrigerator. Do not freeze. Do not shake. Protect from light. Keep this medication in the original container until you are ready to take it. See product for storage information. Get rid of any unused medication after the expiration date. To get rid of medications that are no longer needed or have expired: Take the medication to a medication take-back program. Check with your pharmacy or law enforcement to find a location. If you cannot return the medication, ask your pharmacist or care team how to get rid of the medication safely. NOTE: This sheet is a summary. It may not cover all possible information. If you have questions about this medicine, talk to your doctor, pharmacist, or health care provider.  2023 [employer]vier/[employer] Standard (2021-06-13 00:00:00)   

## 2022-07-28 ENCOUNTER — Emergency Department (HOSPITAL_COMMUNITY): Payer: Medicare HMO

## 2022-07-28 ENCOUNTER — Other Ambulatory Visit: Payer: Self-pay

## 2022-07-28 ENCOUNTER — Encounter (HOSPITAL_COMMUNITY): Payer: Self-pay

## 2022-07-28 ENCOUNTER — Observation Stay (HOSPITAL_COMMUNITY)
Admission: EM | Admit: 2022-07-28 | Discharge: 2022-07-29 | Disposition: A | Discharge status: Home / Self Care | Payer: Medicare HMO | Attending: Family Medicine | Admitting: Family Medicine

## 2022-07-28 DIAGNOSIS — E1140 Type 2 diabetes mellitus with diabetic neuropathy, unspecified: Secondary | ICD-10-CM | POA: Diagnosis not present

## 2022-07-28 DIAGNOSIS — E1122 Type 2 diabetes mellitus with diabetic chronic kidney disease: Secondary | ICD-10-CM | POA: Diagnosis not present

## 2022-07-28 DIAGNOSIS — Z86718 Personal history of other venous thrombosis and embolism: Secondary | ICD-10-CM | POA: Insufficient documentation

## 2022-07-28 DIAGNOSIS — I2510 Atherosclerotic heart disease of native coronary artery without angina pectoris: Secondary | ICD-10-CM | POA: Insufficient documentation

## 2022-07-28 DIAGNOSIS — E876 Hypokalemia: Secondary | ICD-10-CM | POA: Diagnosis not present

## 2022-07-28 DIAGNOSIS — Z743 Need for continuous supervision: Secondary | ICD-10-CM | POA: Diagnosis not present

## 2022-07-28 DIAGNOSIS — I5020 Unspecified systolic (congestive) heart failure: Secondary | ICD-10-CM | POA: Insufficient documentation

## 2022-07-28 DIAGNOSIS — R0902 Hypoxemia: Secondary | ICD-10-CM | POA: Diagnosis not present

## 2022-07-28 DIAGNOSIS — Z8616 Personal history of COVID-19: Secondary | ICD-10-CM | POA: Diagnosis not present

## 2022-07-28 DIAGNOSIS — G9340 Encephalopathy, unspecified: Secondary | ICD-10-CM | POA: Diagnosis not present

## 2022-07-28 DIAGNOSIS — N390 Urinary tract infection, site not specified: Secondary | ICD-10-CM | POA: Diagnosis not present

## 2022-07-28 DIAGNOSIS — I10 Essential (primary) hypertension: Secondary | ICD-10-CM | POA: Diagnosis present

## 2022-07-28 DIAGNOSIS — R000 Tachycardia, unspecified: Secondary | ICD-10-CM | POA: Diagnosis not present

## 2022-07-28 DIAGNOSIS — Z853 Personal history of malignant neoplasm of breast: Secondary | ICD-10-CM | POA: Diagnosis not present

## 2022-07-28 DIAGNOSIS — K219 Gastro-esophageal reflux disease without esophagitis: Secondary | ICD-10-CM | POA: Diagnosis not present

## 2022-07-28 DIAGNOSIS — Z8673 Personal history of transient ischemic attack (TIA), and cerebral infarction without residual deficits: Secondary | ICD-10-CM | POA: Insufficient documentation

## 2022-07-28 DIAGNOSIS — N189 Chronic kidney disease, unspecified: Secondary | ICD-10-CM | POA: Diagnosis not present

## 2022-07-28 DIAGNOSIS — R0602 Shortness of breath: Secondary | ICD-10-CM | POA: Diagnosis present

## 2022-07-28 DIAGNOSIS — Z0389 Encounter for observation for other suspected diseases and conditions ruled out: Secondary | ICD-10-CM | POA: Diagnosis not present

## 2022-07-28 DIAGNOSIS — I130 Hypertensive heart and chronic kidney disease with heart failure and stage 1 through stage 4 chronic kidney disease, or unspecified chronic kidney disease: Secondary | ICD-10-CM | POA: Diagnosis not present

## 2022-07-28 DIAGNOSIS — R609 Edema, unspecified: Secondary | ICD-10-CM | POA: Diagnosis not present

## 2022-07-28 DIAGNOSIS — Z7901 Long term (current) use of anticoagulants: Secondary | ICD-10-CM | POA: Diagnosis not present

## 2022-07-28 DIAGNOSIS — G9341 Metabolic encephalopathy: Secondary | ICD-10-CM | POA: Diagnosis not present

## 2022-07-28 DIAGNOSIS — I6381 Other cerebral infarction due to occlusion or stenosis of small artery: Secondary | ICD-10-CM | POA: Diagnosis not present

## 2022-07-28 DIAGNOSIS — I700 Atherosclerosis of aorta: Secondary | ICD-10-CM | POA: Diagnosis not present

## 2022-07-28 DIAGNOSIS — R531 Weakness: Secondary | ICD-10-CM | POA: Diagnosis not present

## 2022-07-28 DIAGNOSIS — N3090 Cystitis, unspecified without hematuria: Secondary | ICD-10-CM | POA: Diagnosis not present

## 2022-07-28 DIAGNOSIS — R4182 Altered mental status, unspecified: Secondary | ICD-10-CM | POA: Diagnosis not present

## 2022-07-28 DIAGNOSIS — N2889 Other specified disorders of kidney and ureter: Secondary | ICD-10-CM | POA: Diagnosis not present

## 2022-07-28 LAB — CBC WITH DIFFERENTIAL/PLATELET
Abs Immature Granulocytes: 0.15 10*3/uL — ABNORMAL HIGH (ref 0.00–0.07)
Basophils Absolute: 0 10*3/uL (ref 0.0–0.1)
Basophils Relative: 0 %
Eosinophils Absolute: 0 10*3/uL (ref 0.0–0.5)
Eosinophils Relative: 0 %
HCT: 33.5 % — ABNORMAL LOW (ref 36.0–46.0)
Hemoglobin: 10.1 g/dL — ABNORMAL LOW (ref 12.0–15.0)
Immature Granulocytes: 2 %
Lymphocytes Relative: 16 %
Lymphs Abs: 1.5 10*3/uL (ref 0.7–4.0)
MCH: 27.3 pg (ref 26.0–34.0)
MCHC: 30.1 g/dL (ref 30.0–36.0)
MCV: 90.5 fL (ref 80.0–100.0)
Monocytes Absolute: 0.6 10*3/uL (ref 0.1–1.0)
Monocytes Relative: 7 %
Neutro Abs: 7 10*3/uL (ref 1.7–7.7)
Neutrophils Relative %: 75 %
Platelets: 283 10*3/uL (ref 150–400)
RBC: 3.7 MIL/uL — ABNORMAL LOW (ref 3.87–5.11)
RDW: 16 % — ABNORMAL HIGH (ref 11.5–15.5)
WBC: 9.3 10*3/uL (ref 4.0–10.5)
nRBC: 0.4 % — ABNORMAL HIGH (ref 0.0–0.2)

## 2022-07-28 LAB — COMPREHENSIVE METABOLIC PANEL
ALT: 14 U/L (ref 0–44)
AST: 19 U/L (ref 15–41)
Albumin: 3.4 g/dL — ABNORMAL LOW (ref 3.5–5.0)
Alkaline Phosphatase: 75 U/L (ref 38–126)
Anion gap: 13 (ref 5–15)
BUN: 33 mg/dL — ABNORMAL HIGH (ref 8–23)
CO2: 17 mmol/L — ABNORMAL LOW (ref 22–32)
Calcium: 8.7 mg/dL — ABNORMAL LOW (ref 8.9–10.3)
Chloride: 106 mmol/L (ref 98–111)
Creatinine, Ser: 3.08 mg/dL — ABNORMAL HIGH (ref 0.44–1.00)
GFR, Estimated: 15 mL/min — ABNORMAL LOW (ref >60)
Glucose, Bld: 122 mg/dL — ABNORMAL HIGH (ref 70–99)
Potassium: 3.5 mmol/L (ref 3.5–5.1)
Sodium: 136 mmol/L (ref 135–145)
Total Bilirubin: 0.5 mg/dL (ref 0.3–1.2)
Total Protein: 7.9 g/dL (ref 6.5–8.1)

## 2022-07-28 LAB — RESP PANEL BY RT-PCR (RSV, FLU A&B, COVID)  RVPGX2
Influenza A by PCR: NEGATIVE
Influenza B by PCR: NEGATIVE
Resp Syncytial Virus by PCR: NEGATIVE
SARS Coronavirus 2 by RT PCR: NEGATIVE

## 2022-07-28 LAB — URINALYSIS, W/ REFLEX TO CULTURE (INFECTION SUSPECTED)
Bilirubin Urine: NEGATIVE
Glucose, UA: NEGATIVE mg/dL
Hgb urine dipstick: NEGATIVE
Ketones, ur: NEGATIVE mg/dL
Nitrite: NEGATIVE
Protein, ur: 100 mg/dL — AB
Specific Gravity, Urine: 1.013 (ref 1.005–1.030)
pH: 5 (ref 5.0–8.0)

## 2022-07-28 LAB — I-STAT CHEM 8, ED
BUN: 38 mg/dL — ABNORMAL HIGH (ref 8–23)
Calcium, Ion: 1 mmol/L — ABNORMAL LOW (ref 1.15–1.40)
Chloride: 109 mmol/L (ref 98–111)
Creatinine, Ser: 3.6 mg/dL — ABNORMAL HIGH (ref 0.44–1.00)
Glucose, Bld: 123 mg/dL — ABNORMAL HIGH (ref 70–99)
HCT: 32 % — ABNORMAL LOW (ref 36.0–46.0)
Hemoglobin: 10.9 g/dL — ABNORMAL LOW (ref 12.0–15.0)
Potassium: 3.7 mmol/L (ref 3.5–5.1)
Sodium: 138 mmol/L (ref 135–145)
TCO2: 20 mmol/L — ABNORMAL LOW (ref 22–32)

## 2022-07-28 LAB — I-STAT VENOUS BLOOD GAS, ED
Acid-base deficit: 4 mmol/L — ABNORMAL HIGH (ref 0.0–2.0)
Bicarbonate: 17.7 mmol/L — ABNORMAL LOW (ref 20.0–28.0)
Calcium, Ion: 1.02 mmol/L — ABNORMAL LOW (ref 1.15–1.40)
HCT: 31 % — ABNORMAL LOW (ref 36.0–46.0)
Hemoglobin: 10.5 g/dL — ABNORMAL LOW (ref 12.0–15.0)
O2 Saturation: 99 %
Potassium: 3.6 mmol/L (ref 3.5–5.1)
Sodium: 138 mmol/L (ref 135–145)
TCO2: 18 mmol/L — ABNORMAL LOW (ref 22–32)
pCO2, Ven: 23.2 mmHg — ABNORMAL LOW (ref 44–60)
pH, Ven: 7.489 — ABNORMAL HIGH (ref 7.25–7.43)
pO2, Ven: 115 mmHg — ABNORMAL HIGH (ref 32–45)

## 2022-07-28 LAB — BRAIN NATRIURETIC PEPTIDE: B Natriuretic Peptide: 88 pg/mL (ref 0.0–100.0)

## 2022-07-28 LAB — TROPONIN I (HIGH SENSITIVITY)
Troponin I (High Sensitivity): 11 ng/L (ref <18)
Troponin I (High Sensitivity): 13 ng/L (ref <18)

## 2022-07-28 LAB — LACTIC ACID, PLASMA: Lactic Acid, Venous: 1.5 mmol/L (ref 0.5–1.9)

## 2022-07-28 LAB — PROTIME-INR
INR: 1.5 — ABNORMAL HIGH (ref 0.8–1.2)
Prothrombin Time: 18.1 seconds — ABNORMAL HIGH (ref 11.4–15.2)

## 2022-07-28 LAB — MAGNESIUM: Magnesium: 1 mg/dL — ABNORMAL LOW (ref 1.7–2.4)

## 2022-07-28 MED ORDER — PANCRELIPASE (LIP-PROT-AMYL) 36000-114000 UNITS PO CPEP
36000.0000 [IU] | ORAL_CAPSULE | Freq: Three times a day (TID) | ORAL | Status: DC
  Administered 2022-07-29 (×2): 36000 [IU] via ORAL
  Filled 2022-07-28 (×3): qty 1

## 2022-07-28 MED ORDER — SODIUM CHLORIDE 0.9 % IV SOLN: INTRAVENOUS | Status: DC

## 2022-07-28 MED ORDER — HYDRALAZINE HCL 50 MG PO TABS
50.0000 mg | ORAL_TABLET | Freq: Two times a day (BID) | ORAL | Status: DC
  Administered 2022-07-28 – 2022-07-29 (×2): 50 mg via ORAL
  Filled 2022-07-28 (×2): qty 1

## 2022-07-28 MED ORDER — MIRTAZAPINE 15 MG PO TABS
7.5000 mg | ORAL_TABLET | Freq: Every day | ORAL | Status: DC
  Administered 2022-07-28: 7.5 mg via ORAL
  Filled 2022-07-28: qty 1

## 2022-07-28 MED ORDER — FAMOTIDINE 20 MG PO TABS: 20.0000 mg | ORAL_TABLET | Freq: Every day | ORAL | Status: DC | PRN

## 2022-07-28 MED ORDER — SODIUM CHLORIDE 0.9 % IV SOLN
1.0000 g | Freq: Once | INTRAVENOUS | Status: AC
  Administered 2022-07-28: 1 g via INTRAVENOUS
  Filled 2022-07-28: qty 10

## 2022-07-28 MED ORDER — COLESTIPOL HCL 1 G PO TABS
2.0000 g | ORAL_TABLET | Freq: Every day | ORAL | Status: DC
  Filled 2022-07-28: qty 2

## 2022-07-28 MED ORDER — NITROGLYCERIN 0.4 MG SL SUBL: 0.4000 mg | SUBLINGUAL_TABLET | SUBLINGUAL | Status: DC | PRN

## 2022-07-28 MED ORDER — LOPERAMIDE HCL 2 MG PO CAPS: 4.0000 mg | ORAL_CAPSULE | Freq: Four times a day (QID) | ORAL | Status: DC | PRN

## 2022-07-28 MED ORDER — TRAZODONE HCL 50 MG PO TABS
25.0000 mg | ORAL_TABLET | Freq: Every evening | ORAL | Status: DC | PRN
  Administered 2022-07-28: 25 mg via ORAL
  Filled 2022-07-28: qty 1

## 2022-07-28 MED ORDER — MAGNESIUM HYDROXIDE 400 MG/5ML PO SUSP: 30.0000 mL | Freq: Every day | ORAL | Status: DC | PRN

## 2022-07-28 MED ORDER — BUDESONIDE 3 MG PO CPEP
3.0000 mg | ORAL_CAPSULE | Freq: Three times a day (TID) | ORAL | Status: DC
  Administered 2022-07-29 (×2): 3 mg via ORAL
  Filled 2022-07-28 (×5): qty 1

## 2022-07-28 MED ORDER — LACTATED RINGERS IV BOLUS: 250.0000 mL | Freq: Once | INTRAVENOUS | Status: DC

## 2022-07-28 MED ORDER — ONDANSETRON HCL 4 MG PO TABS: 4.0000 mg | ORAL_TABLET | Freq: Four times a day (QID) | ORAL | Status: DC | PRN

## 2022-07-28 MED ORDER — ACETAMINOPHEN 650 MG RE SUPP: 650.0000 mg | Freq: Four times a day (QID) | RECTAL | Status: DC | PRN

## 2022-07-28 MED ORDER — AMLODIPINE BESYLATE 10 MG PO TABS
10.0000 mg | ORAL_TABLET | Freq: Every day | ORAL | Status: DC
  Administered 2022-07-29: 10 mg via ORAL
  Filled 2022-07-28: qty 1

## 2022-07-28 MED ORDER — ACETAMINOPHEN 325 MG PO TABS: 650.0000 mg | ORAL_TABLET | Freq: Four times a day (QID) | ORAL | Status: DC | PRN

## 2022-07-28 MED ORDER — TIZANIDINE HCL 4 MG PO TABS
4.0000 mg | ORAL_TABLET | Freq: Every day | ORAL | Status: DC | PRN
  Administered 2022-07-29: 4 mg via ORAL
  Filled 2022-07-28: qty 1

## 2022-07-28 MED ORDER — LACTATED RINGERS IV BOLUS
500.0000 mL | Freq: Once | INTRAVENOUS | Status: AC
  Administered 2022-07-28: 500 mL via INTRAVENOUS

## 2022-07-28 MED ORDER — FLUOXETINE HCL 20 MG PO CAPS
20.0000 mg | ORAL_CAPSULE | Freq: Every day | ORAL | Status: DC
  Administered 2022-07-28: 20 mg via ORAL
  Filled 2022-07-28: qty 1

## 2022-07-28 MED ORDER — ONDANSETRON HCL 4 MG/2ML IJ SOLN: 4.0000 mg | Freq: Four times a day (QID) | INTRAMUSCULAR | Status: DC | PRN

## 2022-07-28 MED ORDER — MAGNESIUM SULFATE 2 GM/50ML IV SOLN
2.0000 g | Freq: Once | INTRAVENOUS | Status: AC
  Administered 2022-07-28: 2 g via INTRAVENOUS
  Filled 2022-07-28: qty 50

## 2022-07-28 MED ORDER — PANTOPRAZOLE SODIUM 40 MG PO TBEC
40.0000 mg | DELAYED_RELEASE_TABLET | Freq: Two times a day (BID) | ORAL | Status: DC
  Administered 2022-07-28 – 2022-07-29 (×2): 40 mg via ORAL
  Filled 2022-07-28 (×2): qty 1

## 2022-07-28 MED ORDER — MAGNESIUM SULFATE 2 GM/50ML IV SOLN
2.0000 g | Freq: Once | INTRAVENOUS | Status: AC
  Administered 2022-07-29: 2 g via INTRAVENOUS
  Filled 2022-07-28: qty 50

## 2022-07-28 MED ORDER — APIXABAN 2.5 MG PO TABS
2.5000 mg | ORAL_TABLET | Freq: Two times a day (BID) | ORAL | Status: DC
  Administered 2022-07-28 – 2022-07-29 (×2): 2.5 mg via ORAL
  Filled 2022-07-28 (×2): qty 1

## 2022-07-28 NOTE — ED Notes (Signed)
Daughter [name] requests you call her with updates [phone]

## 2022-07-28 NOTE — H&P (Signed)
[place]   PATIENT NAME: [name]    MR#:  [id number]  DATE OF BIRTH:  June 25, 1945  DATE OF ADMISSION:  07/28/2022  PRIMARY CARE PHYSICIAN: Renford Dills, MD   Patient is coming from: Home  REQUESTING/REFERRING PHYSICIAN: Gloris Manchester, MD  CHIEF COMPLAINT:   Chief Complaint  Patient presents with   Shortness of Breath    HISTORY OF PRESENT ILLNESS:  [name] is a 77 y.o. female with medical history significant for coronary artery disease, stage III chronic kidney disease, type II diabetes mellitus, SLE, CVA, peripheral neuropathy, HFpEF, history of DVT and renal vein thrombosis on Eliquis, dyslipidemia and hypertension, who presented to the emergency room with a [place] of altered mental status with confusion as well as generalized weakness.  She admits to urinary frequency and urgency as well as dysuria.  She has been having dyspnea without orthopnea or paroxysmal nocturnal dyspnea or worsening lower extremity edema.  She denies any fever or chills.  She denies any chest pain or palpitations.  No cough or wheezing.  ED Course: When she came to the ER, BP was 167/64 with otherwise normal vital signs.  Labs revealedVBG with pH 7.48 and HCO3 of 17.7 and BMP revealed a BUN of 38 and creatinine 3.6 above previous levels high sensitive troponin I was 30.  Lactic acid level was 1.5 and CBC showed anemia, to previous levels.  INR was 1.5 and PT 18.1. EKG as reviewed by me : EKG showed sinus tachycardia with a rate of 101 with right bundle branch block and left anterior fascicular block with probable left ventricular hypertrophy. Imaging: Portable chest x-ray showed no acute cardiopulmonary disease.  The patient was given IV Rocephin, 2 g of IV magnesium sulfate and 500 mill IV lactated Ringer.  She will be admitted to a medical telemetry observation bed for further evaluation and management. PAST MEDICAL HISTORY:   Past Medical History:  Diagnosis Date   Abdominal  discomfort    Chronic N/V/D. Presumptive dx Crohn's dx per elevated p ANCA. Failed Entocort and Pentasa. Sep 2003 - ileocolectomy c anastomosis per Dr Orson Slick 2/2 adhesions - path was hegative for Crohns. EGD, Sm bowel follow through (11/03), and an eteroclysis (10/03) were unrevealing. Cuases hypomag and hypocalcemia.   Adnexal mass 8/03   s/p lap BSO (R ovarian fibroma) & lysis of adhesions   Allergy    Seasonal   Anemia    Multifactorial. Baseline HgB 10-11 ish. B12 def - 150 in 3/10. Fe Def - ferritin 35 3/10. Both are being repleted.   Breast cancer (HCC) 03/16/13   right, 5 o'clock   CAD (coronary artery disease) 1996   1996 - PTCA and angioplasty diagonal branch. 2000 - Rotoblator & angiopllasty of diagonal. 2006 - subendocardial AMI, DES to proximal LAD.Marland Kitchen Also had 90% stenosis in distal apical LAD. EF 55 with apical hypokinesis. Indefinite ASA and Plavix.   CHF (congestive heart failure) (HCC)    Chronic kidney disease    Chronic renal insuff baseline Cr 1.2 - 1.4 ish.   Chronic kidney disease, stage III (moderate) (HCC)    Chronic renal insuff baseline Cr 1.2 - 1.4 ish. New baseline about 2 (2018)   Chronic pain    CT 10/10 = Spinal stenosis L2 - S1.   COVID-19 11/02/2020   Diabetes mellitus    Insulin dependent   Diabetes mellitus type 2 in obese 05/03/2010   Managed on lantus and novolog. Has diabetic nephropathy. Metformin  D/C'd 2012 2/2 creatinine.  No diabetic retinopathy per 3/11.    Gout    Hx of radiation therapy 06/02/13- 07/16/13   right rbeast 4500 cGy 25 sessions, right breast boost 1600 cGy in 8 sessions   Hyperlipidemia    Managed with both a statin and Welchol. Welchol stopped 2014 2/2 cost and started on fenofibrate    Hypertension    2006 B renal arteries patent. 2003 MRA - no RAS. 2003 pheo W/U Dr Caryn Section reportedly negative.   Hypoxia 07/23/2017   Lupus (HCC)    Lymphedema of breast    Personal history of radiation therapy 2015   Pneumonia of left lung due to  infectious organism    RBBB    Renal vein thrombosis (HCC)    SBO (small bowel obstruction) (HCC) 09/17/2017   Secondary hyperparathyroidism (HCC)    Stroke [facility])    Incidental finding MRI 2002 L lacunar infarct   Vitamin B12 deficiency    Vitamin D deficiency    Wears dentures    top  History of renal vein thrombosis and DVT, on Eliquis.  PAST SURGICAL HISTORY:   Past Surgical History:  Procedure Laterality Date   ABDOMINAL HYSTERECTOMY     BILATERAL SALPINGOOPHORECTOMY  8/03   Lap BSO (R ovarian fibroma) and adhesion lysis   BIOPSY  09/08/2020   Procedure: BIOPSY;  Surgeon: Bernette Redbird, MD;  Location: [facility];  Service: Endoscopy;;   BOWEL RESECTION  2003   ileocolectomy with anastomosis 2/2 adhesions   BREAST BIOPSY Right 2015   BREAST BIOPSY Right 07/2014   BREAST LUMPECTOMY Right 04/22/2013   BREAST LUMPECTOMY WITH NEEDLE LOCALIZATION AND AXILLARY SENTINEL LYMPH NODE BX Right 04/22/2013   Procedure: BREAST LUMPECTOMY WITH NEEDLE LOCALIZATION AND AXILLARY SENTINEL LYMPH NODE BX;  Surgeon: Almond Lint, MD;  Location: [facility];  Service: General;  Laterality: Right;   CARDIAC CATHETERIZATION     2 stents   CHOLECYSTECTOMY     COLONOSCOPY     ESOPHAGOGASTRODUODENOSCOPY N/A 09/08/2020   Procedure: ESOPHAGOGASTRODUODENOSCOPY (EGD);  Surgeon: Bernette Redbird, MD;  Location: [facility];  Service: Endoscopy;  Laterality: N/A;   HEMICOLECTOMY     R sided hemicolectomy   HERNIA REPAIR     Ventral hernia repair   PTCA  4/06    SOCIAL HISTORY:   Social History   Tobacco Use   Smoking status: Never   Smokeless tobacco: Never  Substance Use Topics   Alcohol use: No    Alcohol/week: 0.0 standard drinks of alcohol    FAMILY HISTORY:   Family History  Problem Relation Age of Onset   Pancreatic cancer Brother 4   Breast cancer Mother 37   Lung cancer Maternal Aunt    Breast cancer Maternal Aunt    Prostate cancer Maternal Uncle    Heart  attack Maternal Grandmother    Breast cancer Maternal Grandmother    Colon cancer Maternal Grandmother    Clotting disorder Maternal Grandmother    Diabetes Maternal Grandmother    Kidney failure Maternal Aunt    Hypertension Daughter     DRUG ALLERGIES:   Allergies  Allergen Reactions   Anesthetics, Halogenated Other (See Comments)    Per patient's daughter, she can not tolerate anesthetics.    Etomidate Other (See Comments)    Heart stopped during anesthesia   Fentanyl Other (See Comments)    Heart stopped during anesthesia    Percocet [Oxycodone-Acetaminophen] Other (See Comments)    Headaches  Diazepam Other (See Comments)    Agitation   Lorazepam Nausea Only   Tramadol Hcl Nausea And Vomiting   Haloperidol Nausea And Vomiting    **Haldol**   Morphine Sulfate Nausea Only    agitation   Propoxyphene Nausea And Vomiting    **Darvocet** no longer available   Versed [Midazolam] Other (See Comments)    Heart stopped during anesthesia    REVIEW OF SYSTEMS:   ROS As per history of present illness. All pertinent systems were reviewed above. Constitutional, HEENT, cardiovascular, respiratory, GI, GU, musculoskeletal, neuro, psychiatric, endocrine, integumentary and hematologic systems were reviewed and are otherwise negative/unremarkable except for positive findings mentioned above in the HPI.   MEDICATIONS AT HOME:   Prior to Admission medications   Medication Sig Start Date End Date Taking? Authorizing Provider  acetaminophen (TYLENOL) 500 MG tablet Take 1 tablet (500 mg total) by mouth every 6 (six) hours as needed. Patient taking differently: Take 500 mg by mouth every 6 (six) hours as needed for mild pain. 05/11/22  Yes Nanavati, Ankit, MD  amLODipine (NORVASC) 10 MG tablet Take 1 tablet (10 mg total) by mouth daily. 03/28/22  Yes   apixaban (ELIQUIS) 2.5 MG TABS tablet Take 1 tablet (2.5 mg total) by mouth 2 (two) times daily. 10/30/21  Yes Arrien, York Ram,  MD  budesonide (ENTOCORT EC) 3 MG 24 hr capsule Take 1 capsule by mouth once daily Patient taking differently: Take 3 mg by mouth 3 (three) times daily. 11/30/20  Yes Iva Boop, MD  colestipol (COLESTID) 1 g tablet Take 2 g by mouth at bedtime. 07/06/22  Yes [provider]  CREON 36000-114000 units CPEP capsule Take 36,000 Units by mouth 3 (three) times daily with meals. 06/05/22  Yes [provider]  famotidine (PEPCID) 20 MG tablet Take 20 mg by mouth daily as needed for heartburn or indigestion. 10/14/21  Yes [provider]  FLUoxetine (PROZAC) 20 MG capsule Take 20 mg by mouth at bedtime. 10/12/21  Yes [provider]  furosemide (LASIX) 40 MG tablet Take 1 tablet (40 mg total) by mouth daily. Patient taking differently: Take 40 mg by mouth in the morning. 03/28/22  Yes   hydrALAZINE (APRESOLINE) 50 MG tablet Take 50 mg by mouth in the morning and at bedtime.   Yes [provider]  loperamide (IMODIUM) 2 MG capsule Take 4 mg by mouth every 6 (six) hours as needed for diarrhea or loose stools. 07/21/22  Yes [provider]  mirtazapine (REMERON) 7.5 MG tablet Take 1 tablet (7.5 mg total) by mouth at bedtime. 12/22/20  Yes Malachy Mood, MD  nitroGLYCERIN (NITROSTAT) 0.4 MG SL tablet DISSOLVE 1 TABLET UNDER THE TONGUE EVERY 5 MINUTES AS  NEEDED FOR CHEST PAIN. MAX  OF 3 TABLETS IN 15 MINUTES. CALL 911 IF PAIN PERSISTS. Patient taking differently: Place 0.4 mg under the tongue every 5 (five) minutes as needed for chest pain. 10/04/20  Yes Jake Bathe, MD  pantoprazole (PROTONIX) 40 MG tablet Take 1 tablet (40 mg total) by mouth 2 (two) times daily. 12/23/21  Yes Jacalyn Lefevre, MD  tiZANidine (ZANAFLEX) 4 MG capsule Take 1 capsule (4 mg total) by mouth 3 (three) times daily. Patient taking differently: Take 4 mg by mouth daily as needed for muscle spasms. 05/11/22  Yes Derwood Kaplan, MD      VITAL SIGNS:  Blood pressure (!) 146/52, pulse 79,  temperature 98.1 F (36.7 C), temperature source Oral, resp. rate 16, height  5\' 4"  (1.626 m), weight 72.9 kg, SpO2 97 %.  PHYSICAL EXAMINATION:  Physical Exam  GENERAL:  77 y.o.-year-old African-American female patient lying in the bed with no acute distress.  EYES: Pupils equal, round, reactive to light and accommodation. No scleral icterus. Extraocular muscles intact.  HEENT: Head atraumatic, normocephalic. Oropharynx and nasopharynx clear.  NECK:  Supple, no jugular venous distention. No thyroid enlargement, no tenderness.  LUNGS: Normal breath sounds bilaterally, no wheezing, rales,rhonchi or crepitation. No use of accessory muscles of respiration.  CARDIOVASCULAR: Regular rate and rhythm, S1, S2 normal. No murmurs, rubs, or gallops.  ABDOMEN: Soft, nondistended, nontender. Bowel sounds present. No organomegaly or mass.  EXTREMITIES: No pedal edema, cyanosis, or clubbing.  NEUROLOGIC: Cranial nerves II through XII are intact. Muscle strength 5/5 in all extremities. Sensation intact. Gait not checked.  PSYCHIATRIC: The patient is alert and oriented x 3.  Normal affect and good eye contact. SKIN: No obvious rash, lesion, or ulcer.   LABORATORY PANEL:   CBC Recent Labs  Lab 07/29/22 0233  WBC 8.6  HGB 8.8*  HCT 28.2*  PLT 245   ------------------------------------------------------------------------------------------------------------------  Chemistries  Recent Labs  Lab 07/28/22 1615 07/28/22 1644 07/29/22 0233  NA 136   < > 138  K 3.5   < > 2.9*  CL 106   < > 107  CO2 17*  --  19*  GLUCOSE 122*   < > 108*  BUN 33*   < > 31*  CREATININE 3.08*   < > 2.65*  CALCIUM 8.7*  --  8.3*  MG 1.0*  --   --   AST 19  --   --   ALT 14  --   --   ALKPHOS 75  --   --   BILITOT 0.5  --   --    < > = values in this interval not displayed.   ------------------------------------------------------------------------------------------------------------------  Cardiac Enzymes No  results for input(s): "TROPONINI" in the last 168 hours. ------------------------------------------------------------------------------------------------------------------  RADIOLOGY:  MR BRAIN WO CONTRAST  Result Date: 07/28/2022 CLINICAL DATA:  Altered mental status EXAM: MRI HEAD WITHOUT CONTRAST TECHNIQUE: Multiplanar, multiecho pulse sequences of the brain and surrounding structures were obtained without intravenous contrast. COMPARISON:  12/02/2020 FINDINGS: Brain: No acute infarct, mass effect or extra-axial collection. No acute or chronic hemorrhage. There is multifocal hyperintense T2-weighted signal within the white matter. Generalized volume loss. Cavum septum pellucidum et vergae. Old small vessel infarcts of the deep gray and white matter. Vascular: Major flow voids are preserved. Skull and upper cervical spine: Normal calvarium and skull base. Visualized upper cervical spine and soft tissues are normal. Sinuses/Orbits:No paranasal sinus fluid levels or advanced mucosal thickening. No mastoid or middle ear effusion. Normal orbits. IMPRESSION: 1. No acute intracranial abnormality. 2. Old small vessel infarcts of the deep gray and white matter. 3. Chronic microvascular ischemia. Electronically Signed   By: Deatra Robinson M.D.   On: 07/28/2022 20:39   DG Chest Port 1 View  Result Date: 07/28/2022 CLINICAL DATA:  Possible sepsis EXAM: PORTABLE CHEST 1 VIEW COMPARISON:  CT from 1 hour previous FINDINGS: The heart size and mediastinal contours are within normal limits. Both lungs are clear. The visualized skeletal structures are unremarkable. Postsurgical changes are noted in the right breast. IMPRESSION: No active disease. Electronically Signed   By: Alcide Clever M.D.   On: 07/28/2022 18:40   CT CHEST ABDOMEN PELVIS WO CONTRAST  Result Date: 07/28/2022 CLINICAL DATA:  Possible sepsis EXAM: CT  CHEST, ABDOMEN AND PELVIS WITHOUT CONTRAST TECHNIQUE: Multidetector CT imaging of the chest, abdomen and  pelvis was performed following the standard protocol without IV contrast. RADIATION DOSE REDUCTION: This exam was performed according to the departmental dose-optimization program which includes automated exposure control, adjustment of the mA and/or kV according to patient size and/or use of iterative reconstruction technique. COMPARISON:  10/23/2021 FINDINGS: CT CHEST FINDINGS Cardiovascular: Somewhat limited due to lack of IV contrast. Atherosclerotic calcifications of the thoracic aorta are noted without aneurysmal dilatation. No cardiac enlargement is noted. Coronary calcifications and stenting are seen. Pulmonary artery is not significantly enlarged. Mediastinum/Nodes: Mild sliding-type hiatal hernia is noted. The esophagus is within normal limits. Thoracic inlet is unremarkable. No hilar or mediastinal adenopathy is noted. Lungs/Pleura: Lungs are well aerated bilaterally. No focal infiltrate or sizable effusion is seen. Musculoskeletal: Degenerative changes of the thoracic spine are noted. No acute bony abnormality is noted. Postsurgical changes are noted in the right breast. CT ABDOMEN PELVIS FINDINGS Hepatobiliary: No focal liver abnormality is seen. Status post cholecystectomy. No biliary dilatation. Pancreas: Unremarkable. No pancreatic ductal dilatation or surrounding inflammatory changes. Spleen: Normal in size without focal abnormality. Adrenals/Urinary Tract: Adrenal glands are within normal limits. Kidneys are well visualized bilaterally. No renal calculi or obstructive changes are seen. Small cystic changes are noted within the kidney similar to that noted on the prior examination. No further follow-up is recommended. No obstructive changes are seen. The bladder is partially distended. Stomach/Bowel: Right hemicolectomy is noted. The colon is otherwise within normal limits. Small bowel is within normal limits. Stomach is unremarkable with the exception of a small sliding-type hiatal hernia.  Vascular/Lymphatic: Aortic atherosclerosis. No enlarged abdominal or pelvic lymph nodes. Reproductive: Status post hysterectomy. No adnexal masses. Other: No abdominal wall hernia or abnormality. No abdominopelvic ascites. Musculoskeletal: Degenerative changes of lumbar spine are noted. IMPRESSION: No acute abnormality is noted to correspond with the given clinical history. Chronic changes are noted. Electronically Signed   By: Alcide Clever M.D.   On: 07/28/2022 17:41   CT Head Wo Contrast  Result Date: 07/28/2022 CLINICAL DATA:  Mental status change. EXAM: CT HEAD WITHOUT CONTRAST TECHNIQUE: Contiguous axial images were obtained from the base of the skull through the vertex without intravenous contrast. RADIATION DOSE REDUCTION: This exam was performed according to the departmental dose-optimization program which includes automated exposure control, adjustment of the mA and/or kV according to patient size and/or use of iterative reconstruction technique. COMPARISON:  November 20th 2023 FINDINGS: Brain: No subdural, epidural, or subarachnoid hemorrhage cerebellum, brainstem, and basal cisterns normal limits. Ventricles and sulci are stable. White matter changes including a lacunar infarct in the left basal stable. No acute cortical ischemia or infarct identified. Vascular: No hyperdense vessel or unexpected calcification. Skull: Normal. Negative for fracture or focal lesion. Sinuses/Orbits: Opacification of the inferior left maxillary sinus, chronic in appearance. No evidence of acute sinusitis. Other: None. IMPRESSION: 1. Chronic changes. No acute intracranial abnormalities. 2. Chronic opacification of the inferior left maxillary sinus. Electronically Signed   By: Gerome Sam III M.D.   On: 07/28/2022 17:40      IMPRESSION AND PLAN:  Assessment and Plan: * Acute metabolic encephalopathy - This likely secondary to her UTI. - The patient will be admitted to a medical telemetry observation bed. - We  will follow neurochecks every 4 hours for 24 hours. - Management otherwise as below.  Acute lower UTI - The patient will be placed on IV Rocephin. - Will follow urine culture.  Essential hypertension - We will continue her antihypertensives.  History of renal vein thrombosis - We will continue Eliquis.  GERD without esophagitis - We will continue PPI therapy.     DVT prophylaxis: Eliquis Advanced Care Planning:  Code Status: full code. Family Communication:  The plan of care was discussed in details with the patient (and family). I answered all questions. The patient agreed to proceed with the above mentioned plan. Further management will depend upon hospital course. Disposition Plan: Back to previous home environment Consults called: none. All the records are reviewed and case discussed with ED provider.  Status is: Observation  I certify that at the time of admission, it is my clinical judgment that the patient will require  hospital care extending less than 2 midnights.                            Dispo: The patient is from: Home              Anticipated d/c is to: Home              Patient currently is not medically stable to d/c.              Difficult to place patient: No  Hannah Beat M.D on 07/29/2022 at 3:25 AM  [facility]   From 7 PM-7 AM, contact night-coverage [email or web address]  CC: Primary care physician; Renford Dills, MD

## 2022-07-28 NOTE — ED Notes (Signed)
In and out cath complete of urine obtained. Pending follow up bladder scan

## 2022-07-28 NOTE — ED Provider Notes (Signed)
[place] EMERGENCY DEPARTMENT AT [facility] Provider Note   CSN: [id number] Arrival date & time: 07/28/22  1553     History  Chief Complaint  Patient presents with   Shortness of Breath    [name] is a 77 y.o. female.  HPI Patient presents for generalized weakness, fatigue, shortness of breath.  Medical history includes HTN, CVA, breast CA, DM, neuropathy, CHF, GERD, CAD, CKD, gout, lupus, HLD, anemia.  Family reports that she was in her normal state of health yesterday.  She woke up this morning feeling unwell.  Because of this, she stayed in bed for most of the morning.  When she got up to walk, she endorsed pain in both legs.  Family feels that legs are more swollen than normal.  Family noticed rapid breathing at home.  Patient seemed more confused today than she normally does.  When initial responders came to scene, patient was in the high 80s SpO2.  She was placed on supplemental oxygen.  She does not wear oxygen at baseline.    Home Medications Prior to Admission medications   Medication Sig Start Date End Date Taking? Authorizing Provider  acetaminophen (TYLENOL) 500 MG tablet Take 1 tablet (500 mg total) by mouth every 6 (six) hours as needed. 05/11/22   Derwood Kaplan, MD  amLODipine (NORVASC) 10 MG tablet Take 1 tablet (10 mg total) by mouth daily. 03/28/22     apixaban (ELIQUIS) 2.5 MG TABS tablet Take 1 tablet (2.5 mg total) by mouth 2 (two) times daily. 10/30/21   Arrien, York Ram, MD  budesonide (ENTOCORT EC) 3 MG 24 hr capsule Take 1 capsule by mouth once daily Patient taking differently: Take 3 mg by mouth 3 (three) times daily. 11/30/20   Iva Boop, MD  ergocalciferol (VITAMIN D2) 1.25 MG (50000 UT) capsule Take 1 capsule (50,000 Units total) by mouth once a week. Patient not taking: Reported on 05/10/2022 08/02/20   Malachy Mood, MD  famotidine (PEPCID) 20 MG tablet Take 20 mg by mouth daily as needed for heartburn or indigestion. 10/14/21    [provider]  FLUoxetine (PROZAC) 20 MG capsule Take 20 mg by mouth daily. 10/12/21   [provider]  furosemide (LASIX) 40 MG tablet Take 1 tablet (40 mg total) by mouth daily. Patient not taking: Reported on 05/10/2022 11/26/21 05/10/22  Georga Kaufmann T, PA-C  furosemide (LASIX) 40 MG tablet Take 1 tablet (40 mg total) by mouth daily. Patient taking differently: Take 40 mg by mouth in the morning. 03/28/22     hydrALAZINE (APRESOLINE) 50 MG tablet Take 50 mg by mouth in the morning and at bedtime.    [provider]  mirtazapine (REMERON) 7.5 MG tablet Take 1 tablet (7.5 mg total) by mouth at bedtime. 12/22/20   Malachy Mood, MD  nitroGLYCERIN (NITROSTAT) 0.4 MG SL tablet DISSOLVE 1 TABLET UNDER THE TONGUE EVERY 5 MINUTES AS  NEEDED FOR CHEST PAIN. MAX  OF 3 TABLETS IN 15 MINUTES. CALL 911 IF PAIN PERSISTS. Patient taking differently: Place 0.4 mg under the tongue every 5 (five) minutes as needed for chest pain. 10/04/20   Jake Bathe, MD  ondansetron (ZOFRAN-ODT) 4 MG disintegrating tablet Take 1 tablet (4 mg total) by mouth every 8 (eight) hours as needed for nausea or vomiting. Patient not taking: Reported on 05/10/2022 12/23/21   Jacalyn Lefevre, MD  pantoprazole (PROTONIX) 40 MG tablet Take 1 tablet (40 mg total) by mouth 2 (two) times daily.  12/23/21   Jacalyn Lefevre, MD  potassium chloride (KLOR-CON) 20 MEQ packet Take 1 tablet (20 mEq) by mouth daily. Patient not taking: Reported on 05/10/2022 10/31/21 11/30/21  Arrien, York Ram, MD  tiZANidine (ZANAFLEX) 4 MG capsule Take 1 capsule (4 mg total) by mouth 3 (three) times daily. 05/11/22   Derwood Kaplan, MD      Allergies    Anesthetics, halogenated; Etomidate; Fentanyl; Percocet [oxycodone-acetaminophen]; Diazepam; Haloperidol lactate; Lorazepam; Morphine sulfate; Propoxyphene hcl; Tramadol hcl; Haloperidol; Morphine sulfate; Oxycodone-acetaminophen; Propoxyphene; Tramadol hcl; and Versed [midazolam]     Review of Systems   Review of Systems  Unable to perform ROS: Dementia  Constitutional:  Positive for fatigue.  Respiratory:  Positive for shortness of breath.   Neurological:  Positive for weakness (Generalized).    Physical Exam Updated Vital Signs BP (!) 160/63   Pulse 88   Temp 97.9 F (36.6 C) (Oral)   Resp 13   SpO2 99%  Physical Exam Vitals and nursing note reviewed.  Constitutional:      General: She is not in acute distress.    Appearance: She is well-developed. She is ill-appearing. She is not toxic-appearing or diaphoretic.  HENT:     Head: Normocephalic and atraumatic.     Right Ear: External ear normal.     Left Ear: External ear normal.     Nose: Nose normal.     Mouth/Throat:     Mouth: Mucous membranes are moist.  Eyes:     Extraocular Movements: Extraocular movements intact.     Conjunctiva/sclera: Conjunctivae normal.  Cardiovascular:     Rate and Rhythm: Normal rate and regular rhythm.     Heart sounds: No murmur heard. Pulmonary:     Effort: Pulmonary effort is normal. No respiratory distress.     Breath sounds: Normal breath sounds. No wheezing, rhonchi or rales.  Chest:     Chest wall: No tenderness.  Abdominal:     General: There is no distension.     Palpations: Abdomen is soft.     Tenderness: There is no abdominal tenderness.  Musculoskeletal:        General: No swelling.     Cervical back: Normal range of motion and neck supple.     Right lower leg: Edema present.     Left lower leg: Edema present.  Skin:    General: Skin is warm and dry.     Coloration: Skin is not jaundiced or pale.  Neurological:     General: No focal deficit present.     Mental Status: She is alert. She is disoriented.     Cranial Nerves: No cranial nerve deficit.     Sensory: No sensory deficit.     Motor: No weakness.     Coordination: Coordination normal.  Psychiatric:        Mood and Affect: Mood normal.        Behavior: Behavior normal.     ED  Results / Procedures / Treatments   Labs (all labs ordered are listed, but only abnormal results are displayed) Labs Reviewed  COMPREHENSIVE METABOLIC PANEL - Abnormal; Notable for the following components:      Result Value   CO2 17 (*)    Glucose, Bld 122 (*)    BUN 33 (*)    Creatinine, Ser 3.08 (*)    Calcium 8.7 (*)    Albumin 3.4 (*)    GFR, Estimated 15 (*)    All other components within normal limits  CBC WITH DIFFERENTIAL/PLATELET - Abnormal; Notable for the following components:   RBC 3.70 (*)    Hemoglobin 10.1 (*)    HCT 33.5 (*)    RDW 16.0 (*)    nRBC 0.4 (*)    Abs Immature Granulocytes 0.15 (*)    All other components within normal limits  PROTIME-INR - Abnormal; Notable for the following components:   Prothrombin Time 18.1 (*)    INR 1.5 (*)    All other components within normal limits  URINALYSIS, W/ REFLEX TO CULTURE (INFECTION SUSPECTED) - Abnormal; Notable for the following components:   APPearance HAZY (*)    Protein, ur 100 (*)    Leukocytes,Ua TRACE (*)    Bacteria, UA MANY (*)    All other components within normal limits  MAGNESIUM - Abnormal; Notable for the following components:   Magnesium 1.0 (*)    All other components within normal limits  I-STAT CHEM 8, ED - Abnormal; Notable for the following components:   BUN 38 (*)    Creatinine, Ser 3.60 (*)    Glucose, Bld 123 (*)    Calcium, Ion 1.00 (*)    TCO2 20 (*)    Hemoglobin 10.9 (*)    HCT 32.0 (*)    All other components within normal limits  I-STAT VENOUS BLOOD GAS, ED - Abnormal; Notable for the following components:   pH, Ven 7.489 (*)    pCO2, Ven 23.2 (*)    pO2, Ven 115 (*)    Bicarbonate 17.7 (*)    TCO2 18 (*)    Acid-base deficit 4.0 (*)    Calcium, Ion 1.02 (*)    HCT 31.0 (*)    Hemoglobin 10.5 (*)    All other components within normal limits  RESP PANEL BY RT-PCR (RSV, FLU A&B, COVID)  [id number]  CULTURE, BLOOD (ROUTINE X 2)  CULTURE, BLOOD (ROUTINE X 2)  LACTIC ACID,  PLASMA  BRAIN NATRIURETIC PEPTIDE  LACTIC ACID, PLASMA  BRAIN NATRIURETIC PEPTIDE  TROPONIN I (HIGH SENSITIVITY)  TROPONIN I (HIGH SENSITIVITY)    EKG None  Radiology MR BRAIN WO CONTRAST  Result Date: 07/28/2022 CLINICAL DATA:  Altered mental status EXAM: MRI HEAD WITHOUT CONTRAST TECHNIQUE: Multiplanar, multiecho pulse sequences of the brain and surrounding structures were obtained without intravenous contrast. COMPARISON:  12/02/2020 FINDINGS: Brain: No acute infarct, mass effect or extra-axial collection. No acute or chronic hemorrhage. There is multifocal hyperintense T2-weighted signal within the white matter. Generalized volume loss. Cavum septum pellucidum et vergae. Old small vessel infarcts of the deep gray and white matter. Vascular: Major flow voids are preserved. Skull and upper cervical spine: Normal calvarium and skull base. Visualized upper cervical spine and soft tissues are normal. Sinuses/Orbits:No paranasal sinus fluid levels or advanced mucosal thickening. No mastoid or middle ear effusion. Normal orbits. IMPRESSION: 1. No acute intracranial abnormality. 2. Old small vessel infarcts of the deep gray and white matter. 3. Chronic microvascular ischemia. Electronically Signed   By: Deatra Robinson M.D.   On: 07/28/2022 20:39   DG Chest Port 1 View  Result Date: 07/28/2022 CLINICAL DATA:  Possible sepsis EXAM: PORTABLE CHEST 1 VIEW COMPARISON:  CT from 1 hour previous FINDINGS: The heart size and mediastinal contours are within normal limits. Both lungs are clear. The visualized skeletal structures are unremarkable. Postsurgical changes are noted in the right breast. IMPRESSION: No active disease. Electronically Signed   By: Alcide Clever M.D.   On: 07/28/2022 18:40   CT CHEST ABDOMEN  PELVIS WO CONTRAST  Result Date: 07/28/2022 CLINICAL DATA:  Possible sepsis EXAM: CT CHEST, ABDOMEN AND PELVIS WITHOUT CONTRAST TECHNIQUE: Multidetector CT imaging of the chest, abdomen and pelvis  was performed following the standard protocol without IV contrast. RADIATION DOSE REDUCTION: This exam was performed according to the departmental dose-optimization program which includes automated exposure control, adjustment of the mA and/or kV according to patient size and/or use of iterative reconstruction technique. COMPARISON:  10/23/2021 FINDINGS: CT CHEST FINDINGS Cardiovascular: Somewhat limited due to lack of IV contrast. Atherosclerotic calcifications of the thoracic aorta are noted without aneurysmal dilatation. No cardiac enlargement is noted. Coronary calcifications and stenting are seen. Pulmonary artery is not significantly enlarged. Mediastinum/Nodes: Mild sliding-type hiatal hernia is noted. The esophagus is within normal limits. Thoracic inlet is unremarkable. No hilar or mediastinal adenopathy is noted. Lungs/Pleura: Lungs are well aerated bilaterally. No focal infiltrate or sizable effusion is seen. Musculoskeletal: Degenerative changes of the thoracic spine are noted. No acute bony abnormality is noted. Postsurgical changes are noted in the right breast. CT ABDOMEN PELVIS FINDINGS Hepatobiliary: No focal liver abnormality is seen. Status post cholecystectomy. No biliary dilatation. Pancreas: Unremarkable. No pancreatic ductal dilatation or surrounding inflammatory changes. Spleen: Normal in size without focal abnormality. Adrenals/Urinary Tract: Adrenal glands are within normal limits. Kidneys are well visualized bilaterally. No renal calculi or obstructive changes are seen. Small cystic changes are noted within the kidney similar to that noted on the prior examination. No further follow-up is recommended. No obstructive changes are seen. The bladder is partially distended. Stomach/Bowel: Right hemicolectomy is noted. The colon is otherwise within normal limits. Small bowel is within normal limits. Stomach is unremarkable with the exception of a small sliding-type hiatal hernia.  Vascular/Lymphatic: Aortic atherosclerosis. No enlarged abdominal or pelvic lymph nodes. Reproductive: Status post hysterectomy. No adnexal masses. Other: No abdominal wall hernia or abnormality. No abdominopelvic ascites. Musculoskeletal: Degenerative changes of lumbar spine are noted. IMPRESSION: No acute abnormality is noted to correspond with the given clinical history. Chronic changes are noted. Electronically Signed   By: Alcide Clever M.D.   On: 07/28/2022 17:41   CT Head Wo Contrast  Result Date: 07/28/2022 CLINICAL DATA:  Mental status change. EXAM: CT HEAD WITHOUT CONTRAST TECHNIQUE: Contiguous axial images were obtained from the base of the skull through the vertex without intravenous contrast. RADIATION DOSE REDUCTION: This exam was performed according to the departmental dose-optimization program which includes automated exposure control, adjustment of the mA and/or kV according to patient size and/or use of iterative reconstruction technique. COMPARISON:  November 20th 2023 FINDINGS: Brain: No subdural, epidural, or subarachnoid hemorrhage cerebellum, brainstem, and basal cisterns normal limits. Ventricles and sulci are stable. White matter changes including a lacunar infarct in the left basal stable. No acute cortical ischemia or infarct identified. Vascular: No hyperdense vessel or unexpected calcification. Skull: Normal. Negative for fracture or focal lesion. Sinuses/Orbits: Opacification of the inferior left maxillary sinus, chronic in appearance. No evidence of acute sinusitis. Other: None. IMPRESSION: 1. Chronic changes. No acute intracranial abnormalities. 2. Chronic opacification of the inferior left maxillary sinus. Electronically Signed   By: Gerome Sam III M.D.   On: 07/28/2022 17:40    Procedures Procedures    Medications Ordered in ED Medications  magnesium sulfate IVPB 2 g 50 mL (2 g Intravenous New Bag/Given 07/28/22 2029)  cefTRIAXone (ROCEPHIN) 1 g in sodium chloride  0.9 % 100 mL IVPB (has no administration in time range)  lactated ringers bolus 500 mL (0 mLs  Intravenous Stopped 07/28/22 2105)    ED Course/ Medical Decision Making/ A&P                             Medical Decision Making Amount and/or Complexity of Data Reviewed Labs: ordered. Radiology: ordered. ECG/medicine tests: ordered.  Risk Prescription drug management.   This patient presents to the ED for concern of shortness of breath, generalized weakness, confusion, this involves an extensive number of treatment options, and is a complaint that carries with it a high risk of complications and morbidity.  The differential diagnosis includes infection, metabolic derangements, CHF exacerbation, polypharmacy, dehydration, deconditioning, CVA, TIA, ICH, seizure   Co morbidities that complicate the patient evaluation  HTN, CVA, breast CA, DM, neuropathy, CHF, GERD, CAD, CKD, gout, lupus, HLD, anemia   Additional history obtained:  Additional history obtained from patient's son External records from outside source obtained and reviewed including EMR   Lab Tests:  I Ordered, and personally interpreted labs.  The pertinent results include: Hypomagnesemia with otherwise normal electrolytes, baseline CKD, worsened non-anion gap metabolic acidosis; baseline anemia, no leukocytosis, normal BNP, normal lactate, normal troponin.  Urinalysis shows mild bacteria and pyuria.   Imaging Studies ordered:  I ordered imaging studies including chest x-ray, CT of head, chest, abdomen, pelvis; MRI brain I independently visualized and interpreted imaging which showed no acute findings I agree with the radiologist interpretation   Cardiac Monitoring: / EKG:  The patient was maintained on a cardiac monitor.  I personally viewed and interpreted the cardiac monitored which showed an underlying rhythm of: Sinus rhythm   Problem List / ED Course / Critical interventions / Medication  management  Patient presents for multiple complaints.  History is provided primarily by family.  Family reports that patient was in her normal state of health yesterday.  She had fatigue, generalized weakness, shortness of breath, and acute on chronic confusion starting this morning.  On arrival in the ED, patient is awake and alert.  She is not able to provide history.  She is able to tell me her name.  She denies any pain currently.  Abdomen is soft and nontender.  Lungs are clear to auscultation.  On room air, patient still drops to the 80s and nasal cannula was replaced.  She is not on oxygen at baseline.  On bedside ultrasound, no pericardial effusion or pulmonary edema is identified.  Broad diagnostic workup was initiated.  Patient was given gentle IV fluids.  Initial lab work was notable for hypomagnesemia.  Replacing magnesium was ordered.  CKD appears to be baseline.  She does have a worsened metabolic acidosis which may have contributed to her shortness of breath.  Anemia is baseline.  No leukocytosis is present.  No acute findings were identified on the CT imaging.  Patient underwent MRI which was also negative for acute findings.  Urinalysis does show bacteria and pyuria.  She was treated empirically for UTI.  Patient's breathing improved.  She was taken off of supplemental oxygen and was able to maintain SpO2 in the high 90s.  Her confusion remains.  Patient to be admitted for encephalopathy and observation. I ordered medication including IV fluids for hydration; magnesium sulfate for hypomagnesemia; ceftriaxone for UTI Reevaluation of the patient after these medicines showed that the patient improved I have reviewed the patients home medicines and have made adjustments as needed   Social Determinants of Health:  Lives at home with family  Final Clinical Impression(s) / ED Diagnoses Final diagnoses:  Encephalopathy acute  Hypomagnesemia  Cystitis    Rx / DC Orders ED  Discharge Orders     None         Gloris Manchester, MD 07/28/22 2128

## 2022-07-28 NOTE — ED Notes (Signed)
ED TO INPATIENT HANDOFF REPORT  ED Nurse Name and Phone #: Grant Fontana PM [phone]  S Name/Age/Gender [name] 77 y.o. female Room/Bed: 008C/008C  Code Status   Code Status: Prior  Home/SNF/Other Home Patient oriented to: self, place, and situation Is this baseline? No   Triage Complete: Triage complete  Chief Complaint Acute metabolic encephalopathy [G93.41]  Triage Note Pt to ED via EMS from home with c/o SOB. Per EMS pt stated she has been feeling exacerbation of SOB today hx of CHF and takes prn lasix. EMS stated on arrival pt O2 sats 87% on RA pt placed on 4L [place]. Pt arrives to ED Aox3 reports SOB, denies CP.    Allergies Allergies  Allergen Reactions   Anesthetics, Halogenated Other (See Comments)    Per patient's daughter, she can not tolerate anesthetics.    Etomidate Other (See Comments)    Heart stopped during anesthesia   Fentanyl Other (See Comments)    Heart stopped during anesthesia    Percocet [Oxycodone-Acetaminophen] Other (See Comments)    Headaches    Diazepam Other (See Comments)    Agitation   Haloperidol Lactate Nausea And Vomiting   Lorazepam Nausea Only   Morphine Sulfate Other (See Comments)    Agitation   Propoxyphene Hcl Nausea And Vomiting   Tramadol Hcl Nausea And Vomiting   Haloperidol Nausea And Vomiting   Morphine Sulfate Nausea Only   Oxycodone-Acetaminophen Other (See Comments)    Headaches    Propoxyphene Nausea And Vomiting   Tramadol Hcl Nausea And Vomiting   Versed [Midazolam] Other (See Comments)    Heart stopped during anesthesia    Level of Care/Admitting Diagnosis ED Disposition     ED Disposition  Admit   Condition  --   Comment  Hospital Area: [facility] [[id number]]  Level of Care: Telemetry Medical [104]  May place patient in observation at [facility] or [facility] if equivalent level of care is available:: No  Covid Evaluation: Asymptomatic - no recent exposure (last 10 days)  testing not required  Diagnosis: Acute metabolic encephalopathy [[id number]]  Admitting Physician: Hannah Beat [[id number]]  Attending Physician: Hannah Beat [[id number]]          B Medical/Surgery History Past Medical History:  Diagnosis Date   Abdominal discomfort    Chronic N/V/D. Presumptive dx Crohn's dx per elevated p ANCA. Failed Entocort and Pentasa. Sep 2003 - ileocolectomy c anastomosis per Dr Orson Slick 2/2 adhesions - path was hegative for Crohns. EGD, Sm bowel follow through (11/03), and an eteroclysis (10/03) were unrevealing. Cuases hypomag and hypocalcemia.   Adnexal mass 8/03   s/p lap BSO (R ovarian fibroma) & lysis of adhesions   Allergy    Seasonal   Anemia    Multifactorial. Baseline HgB 10-11 ish. B12 def - 150 in 3/10. Fe Def - ferritin 35 3/10. Both are being repleted.   Breast cancer (HCC) 03/16/13   right, 5 o'clock   CAD (coronary artery disease) 1996   1996 - PTCA and angioplasty diagonal Dolce Sylvia. 2000 - Rotoblator & angiopllasty of diagonal. 2006 - subendocardial AMI, DES to proximal LAD.Marland Kitchen Also had 90% stenosis in distal apical LAD. EF 55 with apical hypokinesis. Indefinite ASA and Plavix.   CHF (congestive heart failure) (HCC)    Chronic kidney disease    Chronic renal insuff baseline Cr 1.2 - 1.4 ish.   Chronic kidney disease, stage III (moderate) (HCC)    Chronic renal insuff baseline Cr  1.2 - 1.4 ish. New baseline about 2 (2018)   Chronic pain    CT 10/10 = Spinal stenosis L2 - S1.   COVID-19 11/02/2020   Diabetes mellitus    Insulin dependent   Diabetes mellitus type 2 in obese 05/03/2010   Managed on lantus and novolog. Has diabetic nephropathy. Metformin D/C'd 2012 2/2 creatinine.  No diabetic retinopathy per 3/11.    Gout    Hx of radiation therapy 06/02/13- 07/16/13   right rbeast 4500 cGy 25 sessions, right breast boost 1600 cGy in 8 sessions   Hyperlipidemia    Managed with both a statin and Welchol. Welchol stopped 2014 2/2 cost and started on  fenofibrate    Hypertension    2006 B renal arteries patent. 2003 MRA - no RAS. 2003 pheo W/U Dr Caryn Section reportedly negative.   Hypoxia 07/23/2017   Lupus (HCC)    Lymphedema of breast    Personal history of radiation therapy 2015   Pneumonia of left lung due to infectious organism    RBBB    Renal vein thrombosis (HCC)    SBO (small bowel obstruction) (HCC) 09/17/2017   Secondary hyperparathyroidism (HCC)    Stroke (HCC)    Incidental finding MRI 2002 L lacunar infarct   Vitamin B12 deficiency    Vitamin D deficiency    Wears dentures    top   Past Surgical History:  Procedure Laterality Date   ABDOMINAL HYSTERECTOMY     BILATERAL SALPINGOOPHORECTOMY  8/03   Lap BSO (R ovarian fibroma) and adhesion lysis   BIOPSY  09/08/2020   Procedure: BIOPSY;  Surgeon: Bernette Redbird, MD;  Location: [facility];  Service: Endoscopy;;   BOWEL RESECTION  2003   ileocolectomy with anastomosis 2/2 adhesions   BREAST BIOPSY Right 2015   BREAST BIOPSY Right 07/2014   BREAST LUMPECTOMY Right 04/22/2013   BREAST LUMPECTOMY WITH NEEDLE LOCALIZATION AND AXILLARY SENTINEL LYMPH NODE BX Right 04/22/2013   Procedure: BREAST LUMPECTOMY WITH NEEDLE LOCALIZATION AND AXILLARY SENTINEL LYMPH NODE BX;  Surgeon: Almond Lint, MD;  Location: [facility];  Service: General;  Laterality: Right;   CARDIAC CATHETERIZATION     2 stents   CHOLECYSTECTOMY     COLONOSCOPY     ESOPHAGOGASTRODUODENOSCOPY N/A 09/08/2020   Procedure: ESOPHAGOGASTRODUODENOSCOPY (EGD);  Surgeon: Bernette Redbird, MD;  Location: [facility];  Service: Endoscopy;  Laterality: N/A;   HEMICOLECTOMY     R sided hemicolectomy   HERNIA REPAIR     Ventral hernia repair   PTCA  4/06     A IV Location/Drains/Wounds Patient Lines/Drains/Airways Status     Active Line/Drains/Airways     Name Placement date Placement time Site Days   Peripheral IV 07/28/22 22 G 1.75" Anterior;Right Forearm 07/28/22  2019  Forearm  less than 1             Intake/Output Last 24 hours  Intake/Output Summary (Last 24 hours) at 07/28/2022 2145 Last data filed at 07/28/2022 2130 Gross per 24 hour  Intake 550 ml  Output --  Net 550 ml    Labs/Imaging Results for orders placed or performed during the hospital encounter of 07/28/22 (from the past 48 hour(s))  Comprehensive metabolic panel     Status: Abnormal   Collection Time: 07/28/22  4:15 PM  Result Value Ref Range   Sodium 136 135 - 145 mmol/L   Potassium 3.5 3.5 - 5.1 mmol/L   Chloride 106 98 - 111 mmol/L   CO2 17 (  L) 22 - 32 mmol/L   Glucose, Bld 122 (H) 70 - 99 mg/dL    Comment: Glucose reference range applies only to samples taken after fasting for at least 8 hours.   BUN 33 (H) 8 - 23 mg/dL   Creatinine, Ser 1.61 (H) 0.44 - 1.00 mg/dL   Calcium 8.7 (L) 8.9 - 10.3 mg/dL   Total Protein 7.9 6.5 - 8.1 g/dL   Albumin 3.4 (L) 3.5 - 5.0 g/dL   AST 19 15 - 41 U/L   ALT 14 0 - 44 U/L   Alkaline Phosphatase 75 38 - 126 U/L   Total Bilirubin 0.5 0.3 - 1.2 mg/dL   GFR, Estimated 15 (L) >60 mL/min    Comment: (NOTE) Calculated using the CKD-EPI Creatinine Equation (2021)    Anion gap 13 5 - 15    Comment: Performed at [facility], [address] [place], [place] [address]  CBC with Differential     Status: Abnormal   Collection Time: 07/28/22  4:15 PM  Result Value Ref Range   WBC 9.3 4.0 - 10.5 K/uL   RBC 3.70 (L) 3.87 - 5.11 MIL/uL   Hemoglobin 10.1 (L) 12.0 - 15.0 g/dL   HCT 54.0 (L) 98.1 - 19.1 %   MCV 90.5 80.0 - 100.0 fL   MCH 27.3 26.0 - 34.0 pg   MCHC 30.1 30.0 - 36.0 g/dL   RDW 47.8 (H) 29.5 - 62.1 %   Platelets 283 150 - 400 K/uL   nRBC 0.4 (H) 0.0 - 0.2 %   Neutrophils Relative % 75 %   Neutro Abs 7.0 1.7 - 7.7 K/uL   Lymphocytes Relative 16 %   Lymphs Abs 1.5 0.7 - 4.0 K/uL   Monocytes Relative 7 %   Monocytes Absolute 0.6 0.1 - 1.0 K/uL   Eosinophils Relative 0 %   Eosinophils Absolute 0.0 0.0 - 0.5 K/uL   Basophils Relative 0 %    Basophils Absolute 0.0 0.0 - 0.1 K/uL   Immature Granulocytes 2 %   Abs Immature Granulocytes 0.15 (H) 0.00 - 0.07 K/uL    Comment: Performed at [facility], [address] [place], [place] [address]  Protime-INR     Status: Abnormal   Collection Time: 07/28/22  4:15 PM  Result Value Ref Range   Prothrombin Time 18.1 (H) 11.4 - 15.2 seconds   INR 1.5 (H) 0.8 - 1.2    Comment: (NOTE) INR goal varies based on device and disease states. Performed at [facility], [address] [place], [place] [address]   Troponin I (High Sensitivity)     Status: None   Collection Time: 07/28/22  4:15 PM  Result Value Ref Range   Troponin I (High Sensitivity) 11 <18 ng/L    Comment: (NOTE) Elevated high sensitivity troponin I (hsTnI) values and significant  changes across serial measurements may suggest ACS but many other  chronic and acute conditions are known to elevate hsTnI results.  Refer to the "Links" section for chest pain algorithms and additional  guidance. Performed at [facility], [address] [place], [place] [address]   Brain natriuretic peptide     Status: None   Collection Time: 07/28/22  4:15 PM  Result Value Ref Range   B Natriuretic Peptide 88.0 0.0 - 100.0 pg/mL    Comment: Performed at [facility], [address] [place], [place] [address]  Magnesium     Status:  Abnormal   Collection Time: 07/28/22  4:15 PM  Result Value Ref Range   Magnesium 1.0 (L) 1.7 - 2.4 mg/dL    Comment: Performed at [facility], [address] [place], [place] [address]  Lactic acid, plasma     Status: None   Collection Time: 07/28/22  4:18 PM  Result Value Ref Range   Lactic Acid, Venous 1.5 0.5 - 1.9 mmol/L    Comment: Performed at [facility], [address], [place], [place] [address]  Urinalysis, w/ Reflex to Culture (Infection Suspected) -Urine, Clean Catch     Status: Abnormal   Collection Time: 07/28/22  4:18 PM  Result Value Ref Range    Specimen Source URINE, CLEAN CATCH    Color, Urine YELLOW YELLOW   APPearance HAZY (A) CLEAR   Specific Gravity, Urine 1.013 1.005 - 1.030   pH 5.0 5.0 - 8.0   Glucose, UA NEGATIVE NEGATIVE mg/dL   Hgb urine dipstick NEGATIVE NEGATIVE   Bilirubin Urine NEGATIVE NEGATIVE   Ketones, ur NEGATIVE NEGATIVE mg/dL   Protein, ur 098 (A) NEGATIVE mg/dL   Nitrite NEGATIVE NEGATIVE   Leukocytes,Ua TRACE (A) NEGATIVE   RBC / HPF 6-10 0 - 5 RBC/hpf   WBC, UA 6-10 0 - 5 WBC/hpf    Comment:        Reflex urine culture not performed if WBC <=10, OR if Squamous epithelial cells >5. If Squamous epithelial cells >5 suggest recollection.    Bacteria, UA MANY (A) NONE SEEN   Squamous Epithelial / HPF 0-5 0 - 5 /HPF   Mucus PRESENT    Budding Yeast PRESENT    Hyphae Yeast PRESENT    Hyaline Casts, UA PRESENT     Comment: Performed at [facility], [address], [place], [place] [address]  I-Stat venous blood gas, ED ([facility])     Status: Abnormal   Collection Time: 07/28/22  4:44 PM  Result Value Ref Range   pH, Ven 7.489 (H) 7.25 - 7.43   pCO2, Ven 23.2 (L) 44 - 60 mmHg   pO2, Ven 115 (H) 32 - 45 mmHg   Bicarbonate 17.7 (L) 20.0 - 28.0 mmol/L   TCO2 18 (L) 22 - 32 mmol/L   O2 Saturation 99 %   Acid-base deficit 4.0 (H) 0.0 - 2.0 mmol/L   Sodium 138 135 - 145 mmol/L   Potassium 3.6 3.5 - 5.1 mmol/L   Calcium, Ion 1.02 (L) 1.15 - 1.40 mmol/L   HCT 31.0 (L) 36.0 - 46.0 %   Hemoglobin 10.5 (L) 12.0 - 15.0 g/dL   Sample type VENOUS   I-Stat Chem 8, ED     Status: Abnormal   Collection Time: 07/28/22  4:45 PM  Result Value Ref Range   Sodium 138 135 - 145 mmol/L   Potassium 3.7 3.5 - 5.1 mmol/L   Chloride 109 98 - 111 mmol/L   BUN 38 (H) 8 - 23 mg/dL   Creatinine, Ser 7.82 (H) 0.44 - 1.00 mg/dL   Glucose, Bld 956 (H) 70 - 99 mg/dL    Comment: Glucose reference range applies only to samples taken after fasting for at least 8 hours.   Calcium, Ion 1.00 (L) 1.15 - 1.40 mmol/L   TCO2 20  (L) 22 - 32 mmol/L   Hemoglobin 10.9 (L) 12.0 - 15.0 g/dL   HCT 21.3 (L) 08.6 - 57.8 %  Resp panel by RT-PCR (RSV, Flu A&B, Covid) Anterior Nasal Swab  Status: None   Collection Time: 07/28/22  5:55 PM   Specimen: Anterior Nasal Swab  Result Value Ref Range   SARS Coronavirus 2 by RT PCR NEGATIVE NEGATIVE   Influenza A by PCR NEGATIVE NEGATIVE   Influenza B by PCR NEGATIVE NEGATIVE    Comment: (NOTE) The Xpert Xpress SARS-CoV-2/FLU/RSV plus assay is intended as an aid in the diagnosis of influenza from Nasopharyngeal swab specimens and should not be used as a sole basis for treatment. Nasal washings and aspirates are unacceptable for Xpert Xpress SARS-CoV-2/FLU/RSV testing.  Fact Sheet for Patients: BloggerCourse.com  Fact Sheet for Healthcare Providers: SeriousBroker.it  This test is not yet approved or cleared by the [place] FDA and has been authorized for detection and/or diagnosis of SARS-CoV-2 by FDA under an Emergency Use Authorization (EUA). This EUA will remain in effect (meaning this test can be used) for the duration of the COVID-19 declaration under Section 564(b)(1) of the Act, 21 U.S.C. section 360bbb-3(b)(1), unless the authorization is terminated or revoked.     Resp Syncytial Virus by PCR NEGATIVE NEGATIVE    Comment: (NOTE) Fact Sheet for Patients: BloggerCourse.com  Fact Sheet for Healthcare Providers: SeriousBroker.it  This test is not yet approved or cleared by the [place] FDA and has been authorized for detection and/or diagnosis of SARS-CoV-2 by FDA under an Emergency Use Authorization (EUA). This EUA will remain in effect (meaning this test can be used) for the duration of the COVID-19 declaration under Section 564(b)(1) of the Act, 21 U.S.C. section 360bbb-3(b)(1), unless the authorization is terminated or revoked.  Performed at [facility], [address], [place], [place] [address]   Troponin I (High Sensitivity)     Status: None   Collection Time: 07/28/22  8:05 PM  Result Value Ref Range   Troponin I (High Sensitivity) 13 <18 ng/L    Comment: (NOTE) Elevated high sensitivity troponin I (hsTnI) values and significant  changes across serial measurements may suggest ACS but many other  chronic and acute conditions are known to elevate hsTnI results.  Refer to the "Links" section for chest pain algorithms and additional  guidance. Performed at [facility], [address], [place], [place] [address]    *Note: Due to a large number of results and/or encounters for the requested time period, some results have not been displayed. A complete set of results can be found in Results Review.   MR BRAIN WO CONTRAST  Result Date: 07/28/2022 CLINICAL DATA:  Altered mental status EXAM: MRI HEAD WITHOUT CONTRAST TECHNIQUE: Multiplanar, multiecho pulse sequences of the brain and surrounding structures were obtained without intravenous contrast. COMPARISON:  12/02/2020 FINDINGS: Brain: No acute infarct, mass effect or extra-axial collection. No acute or chronic hemorrhage. There is multifocal hyperintense T2-weighted signal within the white matter. Generalized volume loss. Cavum septum pellucidum et vergae. Old small vessel infarcts of the deep gray and white matter. Vascular: Major flow voids are preserved. Skull and upper cervical spine: Normal calvarium and skull base. Visualized upper cervical spine and soft tissues are normal. Sinuses/Orbits:No paranasal sinus fluid levels or advanced mucosal thickening. No mastoid or middle ear effusion. Normal orbits. IMPRESSION: 1. No acute intracranial abnormality. 2. Old small vessel infarcts of the deep gray and white matter. 3. Chronic microvascular ischemia. Electronically Signed   By: Deatra Robinson M.D.   On: 07/28/2022 20:39   DG Chest Port 1 View  Result Date:  07/28/2022 CLINICAL DATA:  Possible sepsis EXAM: PORTABLE CHEST  1 VIEW COMPARISON:  CT from 1 hour previous FINDINGS: The heart size and mediastinal contours are within normal limits. Both lungs are clear. The visualized skeletal structures are unremarkable. Postsurgical changes are noted in the right breast. IMPRESSION: No active disease. Electronically Signed   By: Alcide Clever M.D.   On: 07/28/2022 18:40   CT CHEST ABDOMEN PELVIS WO CONTRAST  Result Date: 07/28/2022 CLINICAL DATA:  Possible sepsis EXAM: CT CHEST, ABDOMEN AND PELVIS WITHOUT CONTRAST TECHNIQUE: Multidetector CT imaging of the chest, abdomen and pelvis was performed following the standard protocol without IV contrast. RADIATION DOSE REDUCTION: This exam was performed according to the departmental dose-optimization program which includes automated exposure control, adjustment of the mA and/or kV according to patient size and/or use of iterative reconstruction technique. COMPARISON:  10/23/2021 FINDINGS: CT CHEST FINDINGS Cardiovascular: Somewhat limited due to lack of IV contrast. Atherosclerotic calcifications of the thoracic aorta are noted without aneurysmal dilatation. No cardiac enlargement is noted. Coronary calcifications and stenting are seen. Pulmonary artery is not significantly enlarged. Mediastinum/Nodes: Mild sliding-type hiatal hernia is noted. The esophagus is within normal limits. Thoracic inlet is unremarkable. No hilar or mediastinal adenopathy is noted. Lungs/Pleura: Lungs are well aerated bilaterally. No focal infiltrate or sizable effusion is seen. Musculoskeletal: Degenerative changes of the thoracic spine are noted. No acute bony abnormality is noted. Postsurgical changes are noted in the right breast. CT ABDOMEN PELVIS FINDINGS Hepatobiliary: No focal liver abnormality is seen. Status post cholecystectomy. No biliary dilatation. Pancreas: Unremarkable. No pancreatic ductal dilatation or surrounding inflammatory changes.  Spleen: Normal in size without focal abnormality. Adrenals/Urinary Tract: Adrenal glands are within normal limits. Kidneys are well visualized bilaterally. No renal calculi or obstructive changes are seen. Small cystic changes are noted within the kidney similar to that noted on the prior examination. No further follow-up is recommended. No obstructive changes are seen. The bladder is partially distended. Stomach/Bowel: Right hemicolectomy is noted. The colon is otherwise within normal limits. Small bowel is within normal limits. Stomach is unremarkable with the exception of a small sliding-type hiatal hernia. Vascular/Lymphatic: Aortic atherosclerosis. No enlarged abdominal or pelvic lymph nodes. Reproductive: Status post hysterectomy. No adnexal masses. Other: No abdominal wall hernia or abnormality. No abdominopelvic ascites. Musculoskeletal: Degenerative changes of lumbar spine are noted. IMPRESSION: No acute abnormality is noted to correspond with the given clinical history. Chronic changes are noted. Electronically Signed   By: Alcide Clever M.D.   On: 07/28/2022 17:41   CT Head Wo Contrast  Result Date: 07/28/2022 CLINICAL DATA:  Mental status change. EXAM: CT HEAD WITHOUT CONTRAST TECHNIQUE: Contiguous axial images were obtained from the base of the skull through the vertex without intravenous contrast. RADIATION DOSE REDUCTION: This exam was performed according to the departmental dose-optimization program which includes automated exposure control, adjustment of the mA and/or kV according to patient size and/or use of iterative reconstruction technique. COMPARISON:  November 20th 2023 FINDINGS: Brain: No subdural, epidural, or subarachnoid hemorrhage cerebellum, brainstem, and basal cisterns normal limits. Ventricles and sulci are stable. White matter changes including a lacunar infarct in the left basal stable. No acute cortical ischemia or infarct identified. Vascular: No hyperdense vessel or  unexpected calcification. Skull: Normal. Negative for fracture or focal lesion. Sinuses/Orbits: Opacification of the inferior left maxillary sinus, chronic in appearance. No evidence of acute sinusitis. Other: None. IMPRESSION: 1. Chronic changes. No acute intracranial abnormalities. 2. Chronic opacification of the inferior left maxillary sinus. Electronically Signed   By: Gerome Sam III M.D.  On: 07/28/2022 17:40    Pending Labs Unresulted Labs (From admission, onward)     Start     Ordered   07/28/22 1617  Lactic acid, plasma  (Undifferentiated presentation (screening labs and basic nursing orders))  Now then every 2 hours,   R (with STAT occurrences)      07/28/22 1618   07/28/22 1617  Blood Culture (routine x 2)  (Undifferentiated presentation (screening labs and basic nursing orders))  BLOOD CULTURE X 2,   STAT      07/28/22 1618   07/28/22 1617  Brain natriuretic peptide  (Undifferentiated presentation (screening labs and basic nursing orders))  ONCE - URGENT,   URGENT        07/28/22 1618            Vitals/Pain Today's Vitals   07/28/22 1830 07/28/22 1845 07/28/22 2030 07/28/22 2034  BP: (!) 167/80 (!) 155/70 (!) 160/63   Pulse:   88   Resp: 17 19 13    Temp:    97.9 F (36.6 C)  TempSrc:    Oral  SpO2:   99%   PainSc:        Isolation Precautions No active isolations  Medications Medications  cefTRIAXone (ROCEPHIN) 1 g in sodium chloride 0.9 % 100 mL IVPB (1 g Intravenous New Bag/Given 07/28/22 2129)  magnesium sulfate IVPB 2 g 50 mL (0 g Intravenous Stopped 07/28/22 2130)  lactated ringers bolus 500 mL (0 mLs Intravenous Stopped 07/28/22 2105)    Mobility walks     Focused Assessments     R Recommendations: See Admitting Provider Note  Report given to:   Additional Notes:

## 2022-07-28 NOTE — ED Triage Notes (Signed)
Pt to ED via EMS from home with c/o SOB. Per EMS pt stated she has been feeling exacerbation of SOB today hx of CHF and takes prn lasix. EMS stated on arrival pt O2 sats 87% on RA pt placed on 4L [place]. Pt arrives to ED Aox3 reports SOB, denies CP.

## 2022-07-29 DIAGNOSIS — G9341 Metabolic encephalopathy: Secondary | ICD-10-CM | POA: Diagnosis not present

## 2022-07-29 LAB — BASIC METABOLIC PANEL
Anion gap: 12 (ref 5–15)
BUN: 31 mg/dL — ABNORMAL HIGH (ref 8–23)
CO2: 19 mmol/L — ABNORMAL LOW (ref 22–32)
Calcium: 8.3 mg/dL — ABNORMAL LOW (ref 8.9–10.3)
Chloride: 107 mmol/L (ref 98–111)
Creatinine, Ser: 2.65 mg/dL — ABNORMAL HIGH (ref 0.44–1.00)
GFR, Estimated: 18 mL/min — ABNORMAL LOW (ref >60)
Glucose, Bld: 108 mg/dL — ABNORMAL HIGH (ref 70–99)
Potassium: 2.9 mmol/L — ABNORMAL LOW (ref 3.5–5.1)
Sodium: 138 mmol/L (ref 135–145)

## 2022-07-29 LAB — LACTIC ACID, PLASMA: Lactic Acid, Venous: 0.6 mmol/L (ref 0.5–1.9)

## 2022-07-29 LAB — CBC
HCT: 28.2 % — ABNORMAL LOW (ref 36.0–46.0)
Hemoglobin: 8.8 g/dL — ABNORMAL LOW (ref 12.0–15.0)
MCH: 27.1 pg (ref 26.0–34.0)
MCHC: 31.2 g/dL (ref 30.0–36.0)
MCV: 86.8 fL (ref 80.0–100.0)
Platelets: 245 10*3/uL (ref 150–400)
RBC: 3.25 MIL/uL — ABNORMAL LOW (ref 3.87–5.11)
RDW: 15.8 % — ABNORMAL HIGH (ref 11.5–15.5)
WBC: 8.6 10*3/uL (ref 4.0–10.5)
nRBC: 0.5 % — ABNORMAL HIGH (ref 0.0–0.2)

## 2022-07-29 LAB — BRAIN NATRIURETIC PEPTIDE: B Natriuretic Peptide: 114.5 pg/mL — ABNORMAL HIGH (ref 0.0–100.0)

## 2022-07-29 MED ORDER — FOSFOMYCIN TROMETHAMINE 3 G PO PACK
3.0000 g | PACK | Freq: Once | ORAL | Status: AC
  Administered 2022-07-29: 3 g via ORAL
  Filled 2022-07-29: qty 3

## 2022-07-29 MED ORDER — POTASSIUM CHLORIDE CRYS ER 20 MEQ PO TBCR
40.0000 meq | EXTENDED_RELEASE_TABLET | ORAL | Status: AC
  Administered 2022-07-29 (×2): 40 meq via ORAL
  Filled 2022-07-29 (×2): qty 2

## 2022-07-29 MED ORDER — SODIUM CHLORIDE 0.9 % IV SOLN: 1.0000 g | Freq: Once | INTRAVENOUS | Status: DC

## 2022-07-29 MED ORDER — TIZANIDINE HCL 4 MG PO CAPS: 4.0000 mg | ORAL_CAPSULE | Freq: Three times a day (TID) | ORAL | 0 refills | Status: DC

## 2022-07-29 NOTE — Assessment & Plan Note (Signed)
-   This likely secondary to her UTI. - The patient will be admitted to a medical telemetry observation bed. - We will follow neurochecks every 4 hours for 24 hours. - Management otherwise as below.

## 2022-07-29 NOTE — Progress Notes (Signed)
DISCHARGE NOTE HOME [name] to be discharged Home per MD order. Discussed prescriptions and follow up appointments with the patient. Prescriptions given to patient; medication list explained in detail. Patient verbalized understanding.  Skin clean, dry and intact without evidence of skin break down, no evidence of skin tears noted. IV catheter discontinued intact. Site without signs and symptoms of complications. Dressing and pressure applied. Pt denies pain at the site currently. No complaints noted.  Patient free of lines, drains, and wounds.   An After Visit Summary (AVS) was printed and given to the patient. Patient escorted via wheelchair, and discharged home via private auto. Pt. Picked up by Malachy Chamber, daughter  Margarita Grizzle, RN

## 2022-07-29 NOTE — Discharge Summary (Addendum)
Physician Discharge Summary  [name] WUJ:[id number] DOB: [birth year] DOA: 07/28/2022  PCP: Renford Dills, MD  Admit date: 07/28/2022 Discharge date: 07/29/2022    Admitted From: Home Disposition: Home  Recommendations for Outpatient Follow-up:  Follow up with PCP in 1-2 weeks Please obtain BMP/CBC in one week Please follow up with your PCP on the following pending results: Unresulted Labs (From admission, onward)     Start     Ordered   07/28/22 1617  Blood Culture (routine x 2)  (Undifferentiated presentation (screening labs and basic nursing orders))  BLOOD CULTURE X 2,   STAT      07/28/22 1618              Home Health: None Equipment/Devices: None  Discharge Condition: Stable CODE STATUS: Full code Diet recommendation: Cardiac  Subjective: Seen and examined.  Patient alert and oriented x 2.  She missed current month and the year.  Son was at the bedside.  He told me that she often misses dose.  Based on that information, patient appears to be back at her baseline.  She does appear to have underlying dementia for which she has not been tested yet.  Discussed with the daughter, recommended outpatient neurology follow-up for dementia testing.  Her daughter who is also physician assistant working in this hospital.  I have discussed with her in length.  She prefers for her mother to be discharged and she believes that the patient is at baseline.  Brief/Interim Summary: [name] is a 77 y.o. female with medical history significant for coronary artery disease, stage III chronic kidney disease, type II diabetes mellitus, SLE, CVA, peripheral neuropathy, HFpEF, history of DVT and renal vein thrombosis on Eliquis, dyslipidemia and hypertension, who presented to the emergency room with altered mental status as well as generalized weakness.  She admits to urinary frequency and urgency as well as dysuria.  She has been having dyspnea without orthopnea or paroxysmal  nocturnal dyspnea or worsening lower extremity edema.  She denied any fever or chills.  She denies any chest pain or palpitations.  No cough or wheezing.  Upon arrival to ED, she was hemodynamically stable.  UA was mildly suggestive of UTI.  She received 1 dose of Rocephin and admitted for further management.  When seen this morning, she is fully alert and oriented x 2 which is her baseline.  She has no leukocytosis and no fever.  Urine culture is in process.  Unsure whether her acute encephalopathy was secondary to UTI but there was no other source either.  I will treat her with 1 dose of fosfomycin and no antibiotics at discharge.  She is medically stable for discharge today.  She has mild hypokalemia.  We will replenish that before discharge.  Discharge plan was discussed with patient and/or family member and they verbalized understanding and agreed with it.  Discharge Diagnoses:  Principal Problem:   Acute metabolic encephalopathy Active Problems:   Acute lower UTI   Essential hypertension   History of renal vein thrombosis   GERD without esophagitis   Hypokalemia    Discharge Instructions   Allergies as of 07/29/2022       Reactions   Anesthetics, Halogenated Other (See Comments)   Per patient's daughter, she can not tolerate anesthetics.    Etomidate Other (See Comments)   Heart stopped during anesthesia   Fentanyl Other (See Comments)   Heart stopped during anesthesia   Percocet [oxycodone-acetaminophen] Other (See Comments)   Headaches  Diazepam Other (See Comments)   Agitation   Lorazepam Nausea Only   Tramadol Hcl Nausea And Vomiting   Haloperidol Nausea And Vomiting   **Haldol**   Morphine Sulfate Nausea Only   agitation   Propoxyphene Nausea And Vomiting   **Darvocet** no longer available   Versed [midazolam] Other (See Comments)   Heart stopped during anesthesia        Medication List     TAKE these medications    acetaminophen 500 MG  tablet Commonly known as: TYLENOL Take 1 tablet (500 mg total) by mouth every 6 (six) hours as needed. What changed: reasons to take this   amLODipine 10 MG tablet Commonly known as: NORVASC Take 1 tablet (10 mg total) by mouth daily.   budesonide 3 MG 24 hr capsule Commonly known as: ENTOCORT EC Take 1 capsule by mouth once daily What changed: when to take this   colestipol 1 g tablet Commonly known as: COLESTID Take 2 g by mouth at bedtime.   Creon 36000 UNITS Cpep capsule Generic drug: lipase/protease/amylase Take 36,000 Units by mouth 3 (three) times daily with meals.   Eliquis 2.5 MG Tabs tablet Generic drug: apixaban Take 1 tablet (2.5 mg total) by mouth 2 (two) times daily.   famotidine 20 MG tablet Commonly known as: PEPCID Take 20 mg by mouth daily as needed for heartburn or indigestion.   FLUoxetine 20 MG capsule Commonly known as: PROZAC Take 20 mg by mouth at bedtime.   furosemide 40 MG tablet Commonly known as: LASIX Take 1 tablet (40 mg total) by mouth daily. What changed: when to take this   hydrALAZINE 50 MG tablet Commonly known as: APRESOLINE Take 50 mg by mouth in the morning and at bedtime.   loperamide 2 MG capsule Commonly known as: IMODIUM Take 4 mg by mouth every 6 (six) hours as needed for diarrhea or loose stools.   mirtazapine 7.5 MG tablet Commonly known as: REMERON Take 1 tablet (7.5 mg total) by mouth at bedtime.   nitroGLYCERIN 0.4 MG SL tablet Commonly known as: NITROSTAT DISSOLVE 1 TABLET UNDER THE TONGUE EVERY 5 MINUTES AS  NEEDED FOR CHEST PAIN. MAX  OF 3 TABLETS IN 15 MINUTES. CALL 911 IF PAIN PERSISTS. What changed: See the new instructions.   pantoprazole 40 MG tablet Commonly known as: PROTONIX Take 1 tablet (40 mg total) by mouth 2 (two) times daily.   tiZANidine 4 MG capsule Commonly known as: Zanaflex Take 1 capsule (4 mg total) by mouth 3 (three) times daily. What changed:  when to take this reasons to take  this        Follow-up Information     Renford Dills, MD Follow up in 1 week(s).   Specialty: Internal Medicine Contact information: [address] [place] [address] [phone]                Allergies  Allergen Reactions   Anesthetics, Halogenated Other (See Comments)    Per patient's daughter, she can not tolerate anesthetics.    Etomidate Other (See Comments)    Heart stopped during anesthesia   Fentanyl Other (See Comments)    Heart stopped during anesthesia    Percocet [Oxycodone-Acetaminophen] Other (See Comments)    Headaches    Diazepam Other (See Comments)    Agitation   Lorazepam Nausea Only   Tramadol Hcl Nausea And Vomiting   Haloperidol Nausea And Vomiting    **Haldol**   Morphine Sulfate Nausea Only  agitation   Propoxyphene Nausea And Vomiting    **Darvocet** no longer available   Versed [Midazolam] Other (See Comments)    Heart stopped during anesthesia    Consultations: None   Procedures/Studies: MR BRAIN WO CONTRAST  Result Date: 07/28/2022 CLINICAL DATA:  Altered mental status EXAM: MRI HEAD WITHOUT CONTRAST TECHNIQUE: Multiplanar, multiecho pulse sequences of the brain and surrounding structures were obtained without intravenous contrast. COMPARISON:  12/02/2020 FINDINGS: Brain: No acute infarct, mass effect or extra-axial collection. No acute or chronic hemorrhage. There is multifocal hyperintense T2-weighted signal within the white matter. Generalized volume loss. Cavum septum pellucidum et vergae. Old small vessel infarcts of the deep gray and white matter. Vascular: Major flow voids are preserved. Skull and upper cervical spine: Normal calvarium and skull base. Visualized upper cervical spine and soft tissues are normal. Sinuses/Orbits:No paranasal sinus fluid levels or advanced mucosal thickening. No mastoid or middle ear effusion. Normal orbits. IMPRESSION: 1. No acute intracranial abnormality. 2. Old small vessel  infarcts of the deep gray and white matter. 3. Chronic microvascular ischemia. Electronically Signed   By: Deatra Robinson M.D.   On: 07/28/2022 20:39   DG Chest Port 1 View  Result Date: 07/28/2022 CLINICAL DATA:  Possible sepsis EXAM: PORTABLE CHEST 1 VIEW COMPARISON:  CT from 1 hour previous FINDINGS: The heart size and mediastinal contours are within normal limits. Both lungs are clear. The visualized skeletal structures are unremarkable. Postsurgical changes are noted in the right breast. IMPRESSION: No active disease. Electronically Signed   By: Alcide Clever M.D.   On: 07/28/2022 18:40   CT CHEST ABDOMEN PELVIS WO CONTRAST  Result Date: 07/28/2022 CLINICAL DATA:  Possible sepsis EXAM: CT CHEST, ABDOMEN AND PELVIS WITHOUT CONTRAST TECHNIQUE: Multidetector CT imaging of the chest, abdomen and pelvis was performed following the standard protocol without IV contrast. RADIATION DOSE REDUCTION: This exam was performed according to the departmental dose-optimization program which includes automated exposure control, adjustment of the mA and/or kV according to patient size and/or use of iterative reconstruction technique. COMPARISON:  10/23/2021 FINDINGS: CT CHEST FINDINGS Cardiovascular: Somewhat limited due to lack of IV contrast. Atherosclerotic calcifications of the thoracic aorta are noted without aneurysmal dilatation. No cardiac enlargement is noted. Coronary calcifications and stenting are seen. Pulmonary artery is not significantly enlarged. Mediastinum/Nodes: Mild sliding-type hiatal hernia is noted. The esophagus is within normal limits. Thoracic inlet is unremarkable. No hilar or mediastinal adenopathy is noted. Lungs/Pleura: Lungs are well aerated bilaterally. No focal infiltrate or sizable effusion is seen. Musculoskeletal: Degenerative changes of the thoracic spine are noted. No acute bony abnormality is noted. Postsurgical changes are noted in the right breast. CT ABDOMEN PELVIS FINDINGS  Hepatobiliary: No focal liver abnormality is seen. Status post cholecystectomy. No biliary dilatation. Pancreas: Unremarkable. No pancreatic ductal dilatation or surrounding inflammatory changes. Spleen: Normal in size without focal abnormality. Adrenals/Urinary Tract: Adrenal glands are within normal limits. Kidneys are well visualized bilaterally. No renal calculi or obstructive changes are seen. Small cystic changes are noted within the kidney similar to that noted on the prior examination. No further follow-up is recommended. No obstructive changes are seen. The bladder is partially distended. Stomach/Bowel: Right hemicolectomy is noted. The colon is otherwise within normal limits. Small bowel is within normal limits. Stomach is unremarkable with the exception of a small sliding-type hiatal hernia. Vascular/Lymphatic: Aortic atherosclerosis. No enlarged abdominal or pelvic lymph nodes. Reproductive: Status post hysterectomy. No adnexal masses. Other: No abdominal wall hernia or abnormality. No abdominopelvic ascites. Musculoskeletal:  Degenerative changes of lumbar spine are noted. IMPRESSION: No acute abnormality is noted to correspond with the given clinical history. Chronic changes are noted. Electronically Signed   By: Alcide Clever M.D.   On: 07/28/2022 17:41   CT Head Wo Contrast  Result Date: 07/28/2022 CLINICAL DATA:  Mental status change. EXAM: CT HEAD WITHOUT CONTRAST TECHNIQUE: Contiguous axial images were obtained from the base of the skull through the vertex without intravenous contrast. RADIATION DOSE REDUCTION: This exam was performed according to the departmental dose-optimization program which includes automated exposure control, adjustment of the mA and/or kV according to patient size and/or use of iterative reconstruction technique. COMPARISON:  November 20th 2023 FINDINGS: Brain: No subdural, epidural, or subarachnoid hemorrhage cerebellum, brainstem, and basal cisterns normal limits.  Ventricles and sulci are stable. White matter changes including a lacunar infarct in the left basal stable. No acute cortical ischemia or infarct identified. Vascular: No hyperdense vessel or unexpected calcification. Skull: Normal. Negative for fracture or focal lesion. Sinuses/Orbits: Opacification of the inferior left maxillary sinus, chronic in appearance. No evidence of acute sinusitis. Other: None. IMPRESSION: 1. Chronic changes. No acute intracranial abnormalities. 2. Chronic opacification of the inferior left maxillary sinus. Electronically Signed   By: Gerome Sam III M.D.   On: 07/28/2022 17:40     Discharge Exam: Vitals:   07/29/22 0239 07/29/22 0851  BP: (!) 146/52 (!) 149/56  Pulse: 79 81  Resp: 16 18  Temp: 98.1 F (36.7 C) 99.1 F (37.3 C)  SpO2: 97% 98%   Vitals:   07/28/22 2200 07/28/22 2239 07/29/22 0239 07/29/22 0851  BP: (!) 173/70 (!) 159/60 (!) 146/52 (!) 149/56  Pulse: 82 88 79 81  Resp: 13 17 16 18   Temp:  98.8 F (37.1 C) 98.1 F (36.7 C) 99.1 F (37.3 C)  TempSrc:  Oral Oral Oral  SpO2: 98% 97% 97% 98%  Weight:  72.9 kg    Height:  5\' 4"  (1.626 m)      General: Pt is alert, awake, not in acute distress Cardiovascular: RRR, S1/S2 +, no rubs, no gallops Respiratory: CTA bilaterally, no wheezing, no rhonchi Abdominal: Soft, NT, ND, bowel sounds + Extremities: no edema, no cyanosis    The results of significant diagnostics from this hospitalization (including imaging, microbiology, ancillary and laboratory) are listed below for reference.     Microbiology: Recent Results (from the past 240 hour(s))  Resp panel by RT-PCR (RSV, Flu A&B, Covid) Anterior Nasal Swab     Status: None   Collection Time: 07/28/22  5:55 PM   Specimen: Anterior Nasal Swab  Result Value Ref Range Status   SARS Coronavirus 2 by RT PCR NEGATIVE NEGATIVE Final   Influenza A by PCR NEGATIVE NEGATIVE Final   Influenza B by PCR NEGATIVE NEGATIVE Final    Comment: (NOTE) The  Xpert Xpress SARS-CoV-2/FLU/RSV plus assay is intended as an aid in the diagnosis of influenza from Nasopharyngeal swab specimens and should not be used as a sole basis for treatment. Nasal washings and aspirates are unacceptable for Xpert Xpress SARS-CoV-2/FLU/RSV testing.  Fact Sheet for Patients: BloggerCourse.com  Fact Sheet for Healthcare Providers: SeriousBroker.it  This test is not yet approved or cleared by the [place] FDA and has been authorized for detection and/or diagnosis of SARS-CoV-2 by FDA under an Emergency Use Authorization (EUA). This EUA will remain in effect (meaning this test can be used) for the duration of the COVID-19 declaration under Section 564(b)(1) of the Act, 21 U.S.C.  section 360bbb-3(b)(1), unless the authorization is terminated or revoked.     Resp Syncytial Virus by PCR NEGATIVE NEGATIVE Final    Comment: (NOTE) Fact Sheet for Patients: BloggerCourse.com  Fact Sheet for Healthcare Providers: SeriousBroker.it  This test is not yet approved or cleared by the [place] FDA and has been authorized for detection and/or diagnosis of SARS-CoV-2 by FDA under an Emergency Use Authorization (EUA). This EUA will remain in effect (meaning this test can be used) for the duration of the COVID-19 declaration under Section 564(b)(1) of the Act, 21 U.S.C. section 360bbb-3(b)(1), unless the authorization is terminated or revoked.  Performed at [facility], [address] [place], [place] [address]      Labs: BNP (last 3 results) Recent Labs    02/03/22 0406 07/28/22 1615 07/29/22 0233  BNP 83.8 88.0 114.5*   Basic Metabolic Panel: Recent Labs  Lab 07/28/22 1615 07/28/22 1644 07/28/22 1645 07/29/22 0233  NA 136 138 138 138  K 3.5 3.6 3.7 2.9*  CL 106  --  109 107  CO2 17*  --   --  19*  GLUCOSE 122*  --  123* 108*  BUN 33*   --  38* 31*  CREATININE 3.08*  --  3.60* 2.65*  CALCIUM 8.7*  --   --  8.3*  MG 1.0*  --   --   --    Liver Function Tests: Recent Labs  Lab 07/28/22 1615  AST 19  ALT 14  ALKPHOS 75  BILITOT 0.5  PROT 7.9  ALBUMIN 3.4*   No results for input(s): "LIPASE", "AMYLASE" in the last 168 hours. No results for input(s): "AMMONIA" in the last 168 hours. CBC: Recent Labs  Lab 07/26/22 1337 07/28/22 1615 07/28/22 1644 07/28/22 1645 07/29/22 0233  WBC 8.4 9.3  --   --  8.6  NEUTROABS 6.7 7.0  --   --   --   HGB 10.0* 10.1* 10.5* 10.9* 8.8*  HCT 30.8* 33.5* 31.0* 32.0* 28.2*  MCV 86.8 90.5  --   --  86.8  PLT 249 283  --   --  245   Cardiac Enzymes: No results for input(s): "CKTOTAL", "CKMB", "CKMBINDEX", "TROPONINI" in the last 168 hours. BNP: Invalid input(s): "POCBNP" CBG: No results for input(s): "GLUCAP" in the last 168 hours. D-Dimer No results for input(s): "DDIMER" in the last 72 hours. Hgb A1c No results for input(s): "HGBA1C" in the last 72 hours. Lipid Profile No results for input(s): "CHOL", "HDL", "LDLCALC", "TRIG", "CHOLHDL", "LDLDIRECT" in the last 72 hours. Thyroid function studies No results for input(s): "TSH", "T4TOTAL", "T3FREE", "THYROIDAB" in the last 72 hours.  Invalid input(s): "FREET3" Anemia work up Recent Labs    07/26/22 1338  FERRITIN 457*   Urinalysis    Component Value Date/Time   COLORURINE YELLOW 07/28/2022 1618   APPEARANCEUR HAZY (A) 07/28/2022 1618   APPEARANCEUR Clear 08/22/2018 1620   LABSPEC 1.013 07/28/2022 1618   LABSPEC 1.020 03/03/2014 1341   PHURINE 5.0 07/28/2022 1618   GLUCOSEU NEGATIVE 07/28/2022 1618   GLUCOSEU Negative 03/03/2014 1341   HGBUR NEGATIVE 07/28/2022 1618   BILIRUBINUR NEGATIVE 07/28/2022 1618   BILIRUBINUR Negative 08/22/2018 1620   BILIRUBINUR Negative 03/03/2014 1341   KETONESUR NEGATIVE 07/28/2022 1618   PROTEINUR 100 (A) 07/28/2022 1618   UROBILINOGEN 0.2 03/03/2014 1341   NITRITE NEGATIVE  07/28/2022 1618   LEUKOCYTESUR TRACE (A) 07/28/2022 1618   LEUKOCYTESUR Negative 03/03/2014 1341   Sepsis Labs Recent Labs  Lab 07/26/22 1337 07/28/22 1615 07/29/22 0233  WBC 8.4 9.3 8.6   Microbiology Recent Results (from the past 240 hour(s))  Resp panel by RT-PCR (RSV, Flu A&B, Covid) Anterior Nasal Swab     Status: None   Collection Time: 07/28/22  5:55 PM   Specimen: Anterior Nasal Swab  Result Value Ref Range Status   SARS Coronavirus 2 by RT PCR NEGATIVE NEGATIVE Final   Influenza A by PCR NEGATIVE NEGATIVE Final   Influenza B by PCR NEGATIVE NEGATIVE Final    Comment: (NOTE) The Xpert Xpress SARS-CoV-2/FLU/RSV plus assay is intended as an aid in the diagnosis of influenza from Nasopharyngeal swab specimens and should not be used as a sole basis for treatment. Nasal washings and aspirates are unacceptable for Xpert Xpress SARS-CoV-2/FLU/RSV testing.  Fact Sheet for Patients: BloggerCourse.com  Fact Sheet for Healthcare Providers: SeriousBroker.it  This test is not yet approved or cleared by the [place] FDA and has been authorized for detection and/or diagnosis of SARS-CoV-2 by FDA under an Emergency Use Authorization (EUA). This EUA will remain in effect (meaning this test can be used) for the duration of the COVID-19 declaration under Section 564(b)(1) of the Act, 21 U.S.C. section 360bbb-3(b)(1), unless the authorization is terminated or revoked.     Resp Syncytial Virus by PCR NEGATIVE NEGATIVE Final    Comment: (NOTE) Fact Sheet for Patients: BloggerCourse.com  Fact Sheet for Healthcare Providers: SeriousBroker.it  This test is not yet approved or cleared by the [place] FDA and has been authorized for detection and/or diagnosis of SARS-CoV-2 by FDA under an Emergency Use Authorization (EUA). This EUA will remain in effect (meaning this test can  be used) for the duration of the COVID-19 declaration under Section 564(b)(1) of the Act, 21 U.S.C. section 360bbb-3(b)(1), unless the authorization is terminated or revoked.  Performed at [facility], [address], [place], [place] [address]      Time coordinating discharge: Over 30 minutes  SIGNED:   Hughie Closs, MD  [facility] 07/29/2022, 12:15 PM *Please note that this is a verbal dictation therefore any spelling or grammatical errors are due to the "Dragon Medical One" system interpretation. If 7PM-7AM, please contact night-coverage [email or web address]

## 2022-07-29 NOTE — Assessment & Plan Note (Signed)
-   We will continue Eliquis. 

## 2022-07-29 NOTE — Progress Notes (Signed)
NEW ADMISSION NOTE   Arrival Method: ED stretcher Mental Orientation: AAOx2 Telemetry: [phone] Assessment: Completed Skin: See flowsheet IV: RUE Pain: 0/10 Tubes: n/a Safety Measures: Safety Fall Prevention Plan has been given, discussed and signed Admission: Completed [facility] Orientation: Patient has been orientated to the room, unit and staff.  Family: none at bedside   Orders have been reviewed and implemented. Will continue to monitor the patient. Call light has been placed within reach and bed alarm has been activated.

## 2022-07-29 NOTE — Assessment & Plan Note (Signed)
-   The patient will be placed on IV Rocephin. - Will follow urine culture.

## 2022-07-29 NOTE — Assessment & Plan Note (Signed)
-   We will continue her antihypertensives. 

## 2022-07-29 NOTE — Plan of Care (Signed)

## 2022-07-29 NOTE — Assessment & Plan Note (Signed)
-   We will continue PPI therapy 

## 2022-07-30 LAB — CULTURE, BLOOD (ROUTINE X 2): Special Requests: ADEQUATE

## 2022-07-31 LAB — CULTURE, BLOOD (ROUTINE X 2)

## 2022-08-01 LAB — CULTURE, BLOOD (ROUTINE X 2)

## 2022-08-02 ENCOUNTER — Inpatient Hospital Stay: Payer: Medicare HMO

## 2022-08-02 ENCOUNTER — Other Ambulatory Visit: Payer: Medicare PPO

## 2022-08-02 LAB — CULTURE, BLOOD (ROUTINE X 2)
Culture: NO GROWTH
Special Requests: ADEQUATE

## 2022-08-03 LAB — CULTURE, BLOOD (ROUTINE X 2): Culture: NO GROWTH

## 2022-08-07 DIAGNOSIS — E1122 Type 2 diabetes mellitus with diabetic chronic kidney disease: Secondary | ICD-10-CM | POA: Diagnosis not present

## 2022-08-07 DIAGNOSIS — I700 Atherosclerosis of aorta: Secondary | ICD-10-CM | POA: Diagnosis not present

## 2022-08-07 DIAGNOSIS — I10 Essential (primary) hypertension: Secondary | ICD-10-CM | POA: Diagnosis not present

## 2022-08-07 DIAGNOSIS — I82551 Chronic embolism and thrombosis of right peroneal vein: Secondary | ICD-10-CM | POA: Diagnosis not present

## 2022-08-07 DIAGNOSIS — G3184 Mild cognitive impairment, so stated: Secondary | ICD-10-CM | POA: Diagnosis not present

## 2022-08-07 DIAGNOSIS — N184 Chronic kidney disease, stage 4 (severe): Secondary | ICD-10-CM | POA: Diagnosis not present

## 2022-08-07 DIAGNOSIS — N390 Urinary tract infection, site not specified: Secondary | ICD-10-CM | POA: Diagnosis not present

## 2022-08-07 DIAGNOSIS — Z853 Personal history of malignant neoplasm of breast: Secondary | ICD-10-CM | POA: Diagnosis not present

## 2022-08-08 DIAGNOSIS — D509 Iron deficiency anemia, unspecified: Secondary | ICD-10-CM | POA: Diagnosis not present

## 2022-08-08 DIAGNOSIS — K8689 Other specified diseases of pancreas: Secondary | ICD-10-CM | POA: Diagnosis not present

## 2022-08-08 DIAGNOSIS — K219 Gastro-esophageal reflux disease without esophagitis: Secondary | ICD-10-CM | POA: Diagnosis not present

## 2022-08-08 DIAGNOSIS — R109 Unspecified abdominal pain: Secondary | ICD-10-CM | POA: Diagnosis not present

## 2022-08-08 DIAGNOSIS — K529 Noninfective gastroenteritis and colitis, unspecified: Secondary | ICD-10-CM | POA: Diagnosis not present

## 2022-08-08 DIAGNOSIS — N390 Urinary tract infection, site not specified: Secondary | ICD-10-CM | POA: Diagnosis not present

## 2022-08-09 ENCOUNTER — Other Ambulatory Visit: Payer: Self-pay

## 2022-08-09 ENCOUNTER — Inpatient Hospital Stay: Payer: Medicare HMO

## 2022-08-09 VITALS — BP 170/65 | HR 95 | Temp 98.4°F | Resp 16

## 2022-08-09 DIAGNOSIS — N184 Chronic kidney disease, stage 4 (severe): Secondary | ICD-10-CM | POA: Diagnosis not present

## 2022-08-09 DIAGNOSIS — E538 Deficiency of other specified B group vitamins: Secondary | ICD-10-CM

## 2022-08-09 DIAGNOSIS — D464 Refractory anemia, unspecified: Secondary | ICD-10-CM

## 2022-08-09 DIAGNOSIS — D509 Iron deficiency anemia, unspecified: Secondary | ICD-10-CM | POA: Diagnosis not present

## 2022-08-09 DIAGNOSIS — D631 Anemia in chronic kidney disease: Secondary | ICD-10-CM | POA: Diagnosis not present

## 2022-08-09 DIAGNOSIS — D519 Vitamin B12 deficiency anemia, unspecified: Secondary | ICD-10-CM | POA: Diagnosis not present

## 2022-08-09 DIAGNOSIS — Z170 Estrogen receptor positive status [ER+]: Secondary | ICD-10-CM

## 2022-08-09 LAB — CBC WITH DIFFERENTIAL (CANCER CENTER ONLY)
Abs Immature Granulocytes: 0.04 10*3/uL (ref 0.00–0.07)
Basophils Absolute: 0 10*3/uL (ref 0.0–0.1)
Basophils Relative: 0 %
Eosinophils Absolute: 0.1 10*3/uL (ref 0.0–0.5)
Eosinophils Relative: 1 %
HCT: 31 % — ABNORMAL LOW (ref 36.0–46.0)
Hemoglobin: 9.8 g/dL — ABNORMAL LOW (ref 12.0–15.0)
Immature Granulocytes: 1 %
Lymphocytes Relative: 14 %
Lymphs Abs: 1 10*3/uL (ref 0.7–4.0)
MCH: 28 pg (ref 26.0–34.0)
MCHC: 31.6 g/dL (ref 30.0–36.0)
MCV: 88.6 fL (ref 80.0–100.0)
Monocytes Absolute: 0.5 10*3/uL (ref 0.1–1.0)
Monocytes Relative: 7 %
Neutro Abs: 6 10*3/uL (ref 1.7–7.7)
Neutrophils Relative %: 77 %
Platelet Count: 243 10*3/uL (ref 150–400)
RBC: 3.5 MIL/uL — ABNORMAL LOW (ref 3.87–5.11)
RDW: 16.9 % — ABNORMAL HIGH (ref 11.5–15.5)
WBC Count: 7.6 10*3/uL (ref 4.0–10.5)
nRBC: 0 % (ref 0.0–0.2)

## 2022-08-09 LAB — VITAMIN B12: Vitamin B-12: 1100 pg/mL — ABNORMAL HIGH (ref 180–914)

## 2022-08-09 LAB — SAMPLE TO BLOOD BANK

## 2022-08-09 MED ORDER — EPOETIN ALFA-EPBX 40000 UNIT/ML IJ SOLN: 40000.0000 [IU] | Freq: Once | INTRAMUSCULAR | Status: DC

## 2022-08-09 MED ORDER — SODIUM CHLORIDE 0.9 % IV SOLN: Freq: Once | INTRAVENOUS | Status: AC

## 2022-08-09 MED ORDER — SODIUM CHLORIDE 0.9 % IV SOLN
300.0000 mg | Freq: Once | INTRAVENOUS | Status: AC
  Administered 2022-08-09: 300 mg via INTRAVENOUS
  Filled 2022-08-09: qty 300

## 2022-08-09 NOTE — Patient Instructions (Signed)

## 2022-08-15 DIAGNOSIS — E1159 Type 2 diabetes mellitus with other circulatory complications: Secondary | ICD-10-CM | POA: Diagnosis not present

## 2022-08-15 DIAGNOSIS — E1122 Type 2 diabetes mellitus with diabetic chronic kidney disease: Secondary | ICD-10-CM | POA: Diagnosis not present

## 2022-08-15 DIAGNOSIS — D84821 Immunodeficiency due to drugs: Secondary | ICD-10-CM | POA: Diagnosis not present

## 2022-08-15 DIAGNOSIS — Z008 Encounter for other general examination: Secondary | ICD-10-CM | POA: Diagnosis not present

## 2022-08-15 DIAGNOSIS — I130 Hypertensive heart and chronic kidney disease with heart failure and stage 1 through stage 4 chronic kidney disease, or unspecified chronic kidney disease: Secondary | ICD-10-CM | POA: Diagnosis not present

## 2022-08-15 DIAGNOSIS — I25119 Atherosclerotic heart disease of native coronary artery with unspecified angina pectoris: Secondary | ICD-10-CM | POA: Diagnosis not present

## 2022-08-15 DIAGNOSIS — E785 Hyperlipidemia, unspecified: Secondary | ICD-10-CM | POA: Diagnosis not present

## 2022-08-15 DIAGNOSIS — G3184 Mild cognitive impairment, so stated: Secondary | ICD-10-CM | POA: Diagnosis not present

## 2022-08-15 DIAGNOSIS — M1990 Unspecified osteoarthritis, unspecified site: Secondary | ICD-10-CM | POA: Diagnosis not present

## 2022-08-15 DIAGNOSIS — E1142 Type 2 diabetes mellitus with diabetic polyneuropathy: Secondary | ICD-10-CM | POA: Diagnosis not present

## 2022-08-15 DIAGNOSIS — M62838 Other muscle spasm: Secondary | ICD-10-CM | POA: Diagnosis not present

## 2022-08-15 DIAGNOSIS — K219 Gastro-esophageal reflux disease without esophagitis: Secondary | ICD-10-CM | POA: Diagnosis not present

## 2022-08-15 DIAGNOSIS — I509 Heart failure, unspecified: Secondary | ICD-10-CM | POA: Diagnosis not present

## 2022-08-16 ENCOUNTER — Inpatient Hospital Stay: Payer: Medicare HMO

## 2022-08-16 VITALS — BP 156/64 | HR 82 | Temp 98.7°F | Resp 16

## 2022-08-16 DIAGNOSIS — D509 Iron deficiency anemia, unspecified: Secondary | ICD-10-CM

## 2022-08-16 DIAGNOSIS — N184 Chronic kidney disease, stage 4 (severe): Secondary | ICD-10-CM | POA: Diagnosis not present

## 2022-08-16 DIAGNOSIS — D464 Refractory anemia, unspecified: Secondary | ICD-10-CM

## 2022-08-16 DIAGNOSIS — D631 Anemia in chronic kidney disease: Secondary | ICD-10-CM

## 2022-08-16 DIAGNOSIS — D519 Vitamin B12 deficiency anemia, unspecified: Secondary | ICD-10-CM | POA: Diagnosis not present

## 2022-08-16 LAB — CBC WITH DIFFERENTIAL (CANCER CENTER ONLY)
Abs Immature Granulocytes: 0.04 10*3/uL (ref 0.00–0.07)
Basophils Absolute: 0.1 10*3/uL (ref 0.0–0.1)
Basophils Relative: 1 %
Eosinophils Absolute: 0 10*3/uL (ref 0.0–0.5)
Eosinophils Relative: 0 %
HCT: 32.4 % — ABNORMAL LOW (ref 36.0–46.0)
Hemoglobin: 10.5 g/dL — ABNORMAL LOW (ref 12.0–15.0)
Immature Granulocytes: 0 %
Lymphocytes Relative: 11 %
Lymphs Abs: 1 10*3/uL (ref 0.7–4.0)
MCH: 28.7 pg (ref 26.0–34.0)
MCHC: 32.4 g/dL (ref 30.0–36.0)
MCV: 88.5 fL (ref 80.0–100.0)
Monocytes Absolute: 0.5 10*3/uL (ref 0.1–1.0)
Monocytes Relative: 5 %
Neutro Abs: 7.7 10*3/uL (ref 1.7–7.7)
Neutrophils Relative %: 83 %
Platelet Count: 267 10*3/uL (ref 150–400)
RBC: 3.66 MIL/uL — ABNORMAL LOW (ref 3.87–5.11)
RDW: 16.4 % — ABNORMAL HIGH (ref 11.5–15.5)
WBC Count: 9.2 10*3/uL (ref 4.0–10.5)
nRBC: 0 % (ref 0.0–0.2)

## 2022-08-16 LAB — SAMPLE TO BLOOD BANK

## 2022-08-16 MED ORDER — CYANOCOBALAMIN 1000 MCG/ML IJ SOLN
1000.0000 ug | Freq: Once | INTRAMUSCULAR | Status: AC
  Administered 2022-08-16: 1000 ug via INTRAMUSCULAR
  Filled 2022-08-16: qty 1

## 2022-08-16 MED ORDER — EPOETIN ALFA-EPBX 40000 UNIT/ML IJ SOLN
40000.0000 [IU] | Freq: Once | INTRAMUSCULAR | Status: AC
  Administered 2022-08-16: 40000 [IU] via SUBCUTANEOUS
  Filled 2022-08-16: qty 1

## 2022-08-23 ENCOUNTER — Inpatient Hospital Stay: Payer: Medicare HMO

## 2022-08-29 ENCOUNTER — Encounter: Payer: Self-pay | Admitting: Physician Assistant

## 2022-08-29 ENCOUNTER — Ambulatory Visit
Admission: RE | Admit: 2022-08-29 | Discharge: 2022-08-29 | Disposition: A | Discharge status: Home / Self Care | Payer: Medicare HMO | Source: Ambulatory Visit | Attending: Hematology | Admitting: Hematology

## 2022-08-29 DIAGNOSIS — Z170 Estrogen receptor positive status [ER+]: Secondary | ICD-10-CM

## 2022-08-30 ENCOUNTER — Other Ambulatory Visit: Payer: Self-pay | Admitting: Nurse Practitioner

## 2022-08-30 ENCOUNTER — Other Ambulatory Visit: Payer: Self-pay

## 2022-08-30 ENCOUNTER — Telehealth: Payer: Self-pay

## 2022-08-30 ENCOUNTER — Ambulatory Visit: Payer: Medicare HMO

## 2022-08-30 DIAGNOSIS — Z170 Estrogen receptor positive status [ER+]: Secondary | ICD-10-CM

## 2022-08-30 NOTE — Telephone Encounter (Signed)
I spoke with LaTonya at the [facility] regarding pt's mammogram appt being cancelled on yesterday.  Adair Laundry stated the pt did not f/u with the surgeon Dr. Donell Beers regarding the results of the mammogram done on 07/28/2020 which states "Surgical consult for possible skin punch biopsy of new skin discoloration and possible early peau d'orange change, right breast.  If skin punch biopsy is benign, recommendation for a 6 month right diagnostic mammogram and ultrasound."  Based on LaTonya, the pt had a surgical consultation with Dr. Donell Beers on 09/05/2020 but pt did not show up to the appt.  Adair Laundry stated that the order placed by Dr. Mosetta Putt for Mammogram Screening is not correct.  Adair Laundry stated they need orders for a diagnostic mammogram and ultrasound of the right breast.  Adair Laundry stated they will schedule the pt once the correct orders are placed.    I spoke with Lacie about the outcome of my conversation with Adair Laundry and Lacie gave me a verbal order to place the orders needed for [facility].  I contacted the [facility] and asked them to contact the pt's daughter to get the pt rescheduled for her mammogram and [place].  They will be contacting the pt's daughter today or tomorrow regarding getting the pt scheduled.

## 2022-08-31 ENCOUNTER — Inpatient Hospital Stay: Payer: Medicare HMO

## 2022-08-31 ENCOUNTER — Other Ambulatory Visit: Payer: Self-pay

## 2022-08-31 ENCOUNTER — Inpatient Hospital Stay: Payer: Medicare HMO | Attending: Hematology

## 2022-08-31 DIAGNOSIS — D509 Iron deficiency anemia, unspecified: Secondary | ICD-10-CM

## 2022-08-31 DIAGNOSIS — D631 Anemia in chronic kidney disease: Secondary | ICD-10-CM | POA: Insufficient documentation

## 2022-08-31 DIAGNOSIS — N184 Chronic kidney disease, stage 4 (severe): Secondary | ICD-10-CM | POA: Diagnosis not present

## 2022-08-31 DIAGNOSIS — D464 Refractory anemia, unspecified: Secondary | ICD-10-CM

## 2022-08-31 DIAGNOSIS — E538 Deficiency of other specified B group vitamins: Secondary | ICD-10-CM | POA: Insufficient documentation

## 2022-08-31 LAB — SAMPLE TO BLOOD BANK

## 2022-08-31 LAB — CBC WITH DIFFERENTIAL (CANCER CENTER ONLY)
Abs Immature Granulocytes: 0.05 10*3/uL (ref 0.00–0.07)
Basophils Absolute: 0 10*3/uL (ref 0.0–0.1)
Basophils Relative: 0 %
Eosinophils Absolute: 0 10*3/uL (ref 0.0–0.5)
Eosinophils Relative: 0 %
HCT: 34.2 % — ABNORMAL LOW (ref 36.0–46.0)
Hemoglobin: 10.6 g/dL — ABNORMAL LOW (ref 12.0–15.0)
Immature Granulocytes: 1 %
Lymphocytes Relative: 13 %
Lymphs Abs: 1 10*3/uL (ref 0.7–4.0)
MCH: 27.8 pg (ref 26.0–34.0)
MCHC: 31 g/dL (ref 30.0–36.0)
MCV: 89.8 fL (ref 80.0–100.0)
Monocytes Absolute: 0.5 10*3/uL (ref 0.1–1.0)
Monocytes Relative: 7 %
Neutro Abs: 6.1 10*3/uL (ref 1.7–7.7)
Neutrophils Relative %: 79 %
Platelet Count: 263 10*3/uL (ref 150–400)
RBC: 3.81 MIL/uL — ABNORMAL LOW (ref 3.87–5.11)
RDW: 16.8 % — ABNORMAL HIGH (ref 11.5–15.5)
WBC Count: 7.8 10*3/uL (ref 4.0–10.5)
nRBC: 0 % (ref 0.0–0.2)

## 2022-08-31 NOTE — Progress Notes (Signed)
Pt did not meet parameters to receive injection 10.6 hgb

## 2022-09-04 ENCOUNTER — Ambulatory Visit: Admission: RE | Admit: 2022-09-04 | Payer: Medicare HMO | Source: Ambulatory Visit

## 2022-09-04 ENCOUNTER — Ambulatory Visit
Admission: RE | Admit: 2022-09-04 | Discharge: 2022-09-04 | Disposition: A | Discharge status: Home / Self Care | Payer: Medicare HMO | Source: Ambulatory Visit | Attending: Nurse Practitioner | Admitting: Nurse Practitioner

## 2022-09-04 DIAGNOSIS — Z853 Personal history of malignant neoplasm of breast: Secondary | ICD-10-CM | POA: Diagnosis not present

## 2022-09-04 DIAGNOSIS — C50311 Malignant neoplasm of lower-inner quadrant of right female breast: Secondary | ICD-10-CM

## 2022-09-06 ENCOUNTER — Inpatient Hospital Stay: Payer: Medicare HMO

## 2022-09-06 ENCOUNTER — Telehealth: Payer: Self-pay

## 2022-09-06 NOTE — Telephone Encounter (Addendum)
Called patient and relayed message below as per Santiago Glad NP. Patient voiced full understanding.   ----- Message from Pollyann Samples, NP sent at 09/06/2022  8:08 AM EDT ----- Please let pt know mammogram is negative, no concerns/abnormalities, and review schedule.  Thanks, Clayborn Heron NP

## 2022-09-13 ENCOUNTER — Inpatient Hospital Stay: Payer: Medicare HMO

## 2022-09-13 ENCOUNTER — Other Ambulatory Visit: Payer: Self-pay

## 2022-09-13 VITALS — BP 155/70 | HR 86 | Temp 98.7°F | Resp 16

## 2022-09-13 DIAGNOSIS — D509 Iron deficiency anemia, unspecified: Secondary | ICD-10-CM

## 2022-09-13 DIAGNOSIS — D631 Anemia in chronic kidney disease: Secondary | ICD-10-CM | POA: Diagnosis not present

## 2022-09-13 DIAGNOSIS — Z170 Estrogen receptor positive status [ER+]: Secondary | ICD-10-CM

## 2022-09-13 DIAGNOSIS — N184 Chronic kidney disease, stage 4 (severe): Secondary | ICD-10-CM

## 2022-09-13 DIAGNOSIS — D464 Refractory anemia, unspecified: Secondary | ICD-10-CM

## 2022-09-13 DIAGNOSIS — E538 Deficiency of other specified B group vitamins: Secondary | ICD-10-CM | POA: Diagnosis not present

## 2022-09-13 LAB — SAMPLE TO BLOOD BANK

## 2022-09-13 LAB — CBC WITH DIFFERENTIAL (CANCER CENTER ONLY)
Abs Immature Granulocytes: 0.07 10*3/uL (ref 0.00–0.07)
Basophils Absolute: 0 10*3/uL (ref 0.0–0.1)
Basophils Relative: 0 %
Eosinophils Absolute: 0 10*3/uL (ref 0.0–0.5)
Eosinophils Relative: 0 %
HCT: 32.6 % — ABNORMAL LOW (ref 36.0–46.0)
Hemoglobin: 10.2 g/dL — ABNORMAL LOW (ref 12.0–15.0)
Immature Granulocytes: 1 %
Lymphocytes Relative: 8 %
Lymphs Abs: 0.6 10*3/uL — ABNORMAL LOW (ref 0.7–4.0)
MCH: 27.6 pg (ref 26.0–34.0)
MCHC: 31.3 g/dL (ref 30.0–36.0)
MCV: 88.3 fL (ref 80.0–100.0)
Monocytes Absolute: 0.3 10*3/uL (ref 0.1–1.0)
Monocytes Relative: 3 %
Neutro Abs: 7 10*3/uL (ref 1.7–7.7)
Neutrophils Relative %: 88 %
Platelet Count: 252 10*3/uL (ref 150–400)
RBC: 3.69 MIL/uL — ABNORMAL LOW (ref 3.87–5.11)
RDW: 15.9 % — ABNORMAL HIGH (ref 11.5–15.5)
WBC Count: 8 10*3/uL (ref 4.0–10.5)
nRBC: 0 % (ref 0.0–0.2)

## 2022-09-13 LAB — VITAMIN B12: Vitamin B-12: 787 pg/mL (ref 180–914)

## 2022-09-13 MED ORDER — EPOETIN ALFA-EPBX 40000 UNIT/ML IJ SOLN
40000.0000 [IU] | Freq: Once | INTRAMUSCULAR | Status: AC
  Administered 2022-09-13: 40000 [IU] via SUBCUTANEOUS
  Filled 2022-09-13: qty 1

## 2022-09-21 ENCOUNTER — Inpatient Hospital Stay: Payer: Medicare HMO

## 2022-09-21 ENCOUNTER — Inpatient Hospital Stay (HOSPITAL_COMMUNITY)
Admission: EM | Admit: 2022-09-21 | Discharge: 2022-09-25 | DRG: 389 | Disposition: A | Discharge status: Home / Self Care | Payer: Medicare HMO | Attending: Internal Medicine | Admitting: Internal Medicine

## 2022-09-21 ENCOUNTER — Other Ambulatory Visit: Payer: Self-pay

## 2022-09-21 DIAGNOSIS — R9431 Abnormal electrocardiogram [ECG] [EKG]: Secondary | ICD-10-CM | POA: Diagnosis present

## 2022-09-21 DIAGNOSIS — K219 Gastro-esophageal reflux disease without esophagitis: Secondary | ICD-10-CM | POA: Diagnosis present

## 2022-09-21 DIAGNOSIS — Z9049 Acquired absence of other specified parts of digestive tract: Secondary | ICD-10-CM

## 2022-09-21 DIAGNOSIS — I5032 Chronic diastolic (congestive) heart failure: Secondary | ICD-10-CM | POA: Diagnosis present

## 2022-09-21 DIAGNOSIS — I071 Rheumatic tricuspid insufficiency: Secondary | ICD-10-CM

## 2022-09-21 DIAGNOSIS — N184 Chronic kidney disease, stage 4 (severe): Secondary | ICD-10-CM | POA: Diagnosis present

## 2022-09-21 DIAGNOSIS — K56609 Unspecified intestinal obstruction, unspecified as to partial versus complete obstruction: Principal | ICD-10-CM

## 2022-09-21 DIAGNOSIS — E785 Hyperlipidemia, unspecified: Secondary | ICD-10-CM | POA: Diagnosis present

## 2022-09-21 DIAGNOSIS — I081 Rheumatic disorders of both mitral and tricuspid valves: Secondary | ICD-10-CM | POA: Diagnosis present

## 2022-09-21 DIAGNOSIS — M329 Systemic lupus erythematosus, unspecified: Secondary | ICD-10-CM | POA: Diagnosis present

## 2022-09-21 DIAGNOSIS — M109 Gout, unspecified: Secondary | ICD-10-CM | POA: Diagnosis present

## 2022-09-21 DIAGNOSIS — Z832 Family history of diseases of the blood and blood-forming organs and certain disorders involving the immune mechanism: Secondary | ICD-10-CM

## 2022-09-21 DIAGNOSIS — Z8616 Personal history of COVID-19: Secondary | ICD-10-CM

## 2022-09-21 DIAGNOSIS — I2510 Atherosclerotic heart disease of native coronary artery without angina pectoris: Secondary | ICD-10-CM | POA: Diagnosis present

## 2022-09-21 DIAGNOSIS — Z86718 Personal history of other venous thrombosis and embolism: Secondary | ICD-10-CM

## 2022-09-21 DIAGNOSIS — E119 Type 2 diabetes mellitus without complications: Secondary | ICD-10-CM

## 2022-09-21 DIAGNOSIS — Z8249 Family history of ischemic heart disease and other diseases of the circulatory system: Secondary | ICD-10-CM

## 2022-09-21 DIAGNOSIS — E1122 Type 2 diabetes mellitus with diabetic chronic kidney disease: Secondary | ICD-10-CM | POA: Diagnosis present

## 2022-09-21 DIAGNOSIS — Z7901 Long term (current) use of anticoagulants: Secondary | ICD-10-CM

## 2022-09-21 DIAGNOSIS — Z923 Personal history of irradiation: Secondary | ICD-10-CM

## 2022-09-21 DIAGNOSIS — R140 Abdominal distension (gaseous): Secondary | ICD-10-CM | POA: Diagnosis present

## 2022-09-21 DIAGNOSIS — Z888 Allergy status to other drugs, medicaments and biological substances status: Secondary | ICD-10-CM

## 2022-09-21 DIAGNOSIS — N189 Chronic kidney disease, unspecified: Secondary | ICD-10-CM | POA: Diagnosis present

## 2022-09-21 DIAGNOSIS — N2581 Secondary hyperparathyroidism of renal origin: Secondary | ICD-10-CM | POA: Diagnosis present

## 2022-09-21 DIAGNOSIS — N179 Acute kidney failure, unspecified: Secondary | ICD-10-CM | POA: Diagnosis present

## 2022-09-21 DIAGNOSIS — Z90710 Acquired absence of both cervix and uterus: Secondary | ICD-10-CM

## 2022-09-21 DIAGNOSIS — Z8673 Personal history of transient ischemic attack (TIA), and cerebral infarction without residual deficits: Secondary | ICD-10-CM

## 2022-09-21 DIAGNOSIS — E1142 Type 2 diabetes mellitus with diabetic polyneuropathy: Secondary | ICD-10-CM | POA: Diagnosis present

## 2022-09-21 DIAGNOSIS — Z794 Long term (current) use of insulin: Secondary | ICD-10-CM

## 2022-09-21 DIAGNOSIS — Z79899 Other long term (current) drug therapy: Secondary | ICD-10-CM

## 2022-09-21 DIAGNOSIS — I130 Hypertensive heart and chronic kidney disease with heart failure and stage 1 through stage 4 chronic kidney disease, or unspecified chronic kidney disease: Secondary | ICD-10-CM | POA: Diagnosis present

## 2022-09-21 DIAGNOSIS — Z801 Family history of malignant neoplasm of trachea, bronchus and lung: Secondary | ICD-10-CM

## 2022-09-21 DIAGNOSIS — Z886 Allergy status to analgesic agent status: Secondary | ICD-10-CM

## 2022-09-21 DIAGNOSIS — I340 Nonrheumatic mitral (valve) insufficiency: Secondary | ICD-10-CM

## 2022-09-21 DIAGNOSIS — Z833 Family history of diabetes mellitus: Secondary | ICD-10-CM

## 2022-09-21 DIAGNOSIS — K5090 Crohn's disease, unspecified, without complications: Secondary | ICD-10-CM | POA: Diagnosis present

## 2022-09-21 DIAGNOSIS — Z885 Allergy status to narcotic agent status: Secondary | ICD-10-CM

## 2022-09-21 DIAGNOSIS — K56699 Other intestinal obstruction unspecified as to partial versus complete obstruction: Principal | ICD-10-CM | POA: Diagnosis present

## 2022-09-21 DIAGNOSIS — Z803 Family history of malignant neoplasm of breast: Secondary | ICD-10-CM

## 2022-09-21 DIAGNOSIS — I252 Old myocardial infarction: Secondary | ICD-10-CM

## 2022-09-21 DIAGNOSIS — Z853 Personal history of malignant neoplasm of breast: Secondary | ICD-10-CM

## 2022-09-21 DIAGNOSIS — R000 Tachycardia, unspecified: Secondary | ICD-10-CM | POA: Diagnosis not present

## 2022-09-21 DIAGNOSIS — Z800 Family history of malignant neoplasm of digestive organs: Secondary | ICD-10-CM

## 2022-09-21 NOTE — ED Triage Notes (Signed)
Patient reports pain across her abdomen onset yesterday with emesis and fatigue , denies fever or chills .

## 2022-09-22 ENCOUNTER — Inpatient Hospital Stay (HOSPITAL_COMMUNITY): Payer: Medicare HMO

## 2022-09-22 ENCOUNTER — Emergency Department (HOSPITAL_COMMUNITY): Payer: Medicare HMO

## 2022-09-22 ENCOUNTER — Other Ambulatory Visit: Payer: Self-pay

## 2022-09-22 ENCOUNTER — Encounter (HOSPITAL_COMMUNITY): Payer: Self-pay | Admitting: Internal Medicine

## 2022-09-22 DIAGNOSIS — I700 Atherosclerosis of aorta: Secondary | ICD-10-CM | POA: Diagnosis not present

## 2022-09-22 DIAGNOSIS — I071 Rheumatic tricuspid insufficiency: Secondary | ICD-10-CM

## 2022-09-22 DIAGNOSIS — Z794 Long term (current) use of insulin: Secondary | ICD-10-CM | POA: Diagnosis not present

## 2022-09-22 DIAGNOSIS — E1142 Type 2 diabetes mellitus with diabetic polyneuropathy: Secondary | ICD-10-CM | POA: Diagnosis not present

## 2022-09-22 DIAGNOSIS — Z923 Personal history of irradiation: Secondary | ICD-10-CM | POA: Diagnosis not present

## 2022-09-22 DIAGNOSIS — K5669 Other partial intestinal obstruction: Secondary | ICD-10-CM | POA: Diagnosis not present

## 2022-09-22 DIAGNOSIS — Z8249 Family history of ischemic heart disease and other diseases of the circulatory system: Secondary | ICD-10-CM | POA: Diagnosis not present

## 2022-09-22 DIAGNOSIS — Z832 Family history of diseases of the blood and blood-forming organs and certain disorders involving the immune mechanism: Secondary | ICD-10-CM | POA: Diagnosis not present

## 2022-09-22 DIAGNOSIS — I081 Rheumatic disorders of both mitral and tricuspid valves: Secondary | ICD-10-CM | POA: Diagnosis present

## 2022-09-22 DIAGNOSIS — Z8616 Personal history of COVID-19: Secondary | ICD-10-CM | POA: Diagnosis not present

## 2022-09-22 DIAGNOSIS — Z4682 Encounter for fitting and adjustment of non-vascular catheter: Secondary | ICD-10-CM | POA: Diagnosis not present

## 2022-09-22 DIAGNOSIS — Z801 Family history of malignant neoplasm of trachea, bronchus and lung: Secondary | ICD-10-CM | POA: Diagnosis not present

## 2022-09-22 DIAGNOSIS — K56609 Unspecified intestinal obstruction, unspecified as to partial versus complete obstruction: Secondary | ICD-10-CM

## 2022-09-22 DIAGNOSIS — E1122 Type 2 diabetes mellitus with diabetic chronic kidney disease: Secondary | ICD-10-CM | POA: Diagnosis not present

## 2022-09-22 DIAGNOSIS — Z886 Allergy status to analgesic agent status: Secondary | ICD-10-CM | POA: Diagnosis not present

## 2022-09-22 DIAGNOSIS — M109 Gout, unspecified: Secondary | ICD-10-CM | POA: Diagnosis present

## 2022-09-22 DIAGNOSIS — I5032 Chronic diastolic (congestive) heart failure: Secondary | ICD-10-CM | POA: Diagnosis not present

## 2022-09-22 DIAGNOSIS — M329 Systemic lupus erythematosus, unspecified: Secondary | ICD-10-CM | POA: Diagnosis not present

## 2022-09-22 DIAGNOSIS — K5090 Crohn's disease, unspecified, without complications: Secondary | ICD-10-CM | POA: Diagnosis present

## 2022-09-22 DIAGNOSIS — Z833 Family history of diabetes mellitus: Secondary | ICD-10-CM | POA: Diagnosis not present

## 2022-09-22 DIAGNOSIS — Z803 Family history of malignant neoplasm of breast: Secondary | ICD-10-CM | POA: Diagnosis not present

## 2022-09-22 DIAGNOSIS — N2581 Secondary hyperparathyroidism of renal origin: Secondary | ICD-10-CM | POA: Diagnosis not present

## 2022-09-22 DIAGNOSIS — Z800 Family history of malignant neoplasm of digestive organs: Secondary | ICD-10-CM | POA: Diagnosis not present

## 2022-09-22 DIAGNOSIS — R109 Unspecified abdominal pain: Secondary | ICD-10-CM | POA: Diagnosis not present

## 2022-09-22 DIAGNOSIS — I130 Hypertensive heart and chronic kidney disease with heart failure and stage 1 through stage 4 chronic kidney disease, or unspecified chronic kidney disease: Secondary | ICD-10-CM | POA: Diagnosis not present

## 2022-09-22 DIAGNOSIS — N184 Chronic kidney disease, stage 4 (severe): Secondary | ICD-10-CM | POA: Diagnosis not present

## 2022-09-22 DIAGNOSIS — N179 Acute kidney failure, unspecified: Secondary | ICD-10-CM | POA: Diagnosis not present

## 2022-09-22 DIAGNOSIS — K56699 Other intestinal obstruction unspecified as to partial versus complete obstruction: Secondary | ICD-10-CM | POA: Diagnosis not present

## 2022-09-22 DIAGNOSIS — I340 Nonrheumatic mitral (valve) insufficiency: Secondary | ICD-10-CM

## 2022-09-22 DIAGNOSIS — E785 Hyperlipidemia, unspecified: Secondary | ICD-10-CM | POA: Diagnosis present

## 2022-09-22 LAB — COMPREHENSIVE METABOLIC PANEL
ALT: 14 U/L (ref 0–44)
AST: 19 U/L (ref 15–41)
Albumin: 3.5 g/dL (ref 3.5–5.0)
Alkaline Phosphatase: 85 U/L (ref 38–126)
Anion gap: 20 — ABNORMAL HIGH (ref 5–15)
BUN: 41 mg/dL — ABNORMAL HIGH (ref 8–23)
CO2: 18 mmol/L — ABNORMAL LOW (ref 22–32)
Calcium: 9.9 mg/dL (ref 8.9–10.3)
Chloride: 103 mmol/L (ref 98–111)
Creatinine, Ser: 3.04 mg/dL — ABNORMAL HIGH (ref 0.44–1.00)
GFR, Estimated: 15 mL/min — ABNORMAL LOW (ref >60)
Glucose, Bld: 269 mg/dL — ABNORMAL HIGH (ref 70–99)
Potassium: 4.4 mmol/L (ref 3.5–5.1)
Sodium: 141 mmol/L (ref 135–145)
Total Bilirubin: 0.7 mg/dL (ref 0.3–1.2)
Total Protein: 7.9 g/dL (ref 6.5–8.1)

## 2022-09-22 LAB — CBC
HCT: 36.6 % (ref 36.0–46.0)
Hemoglobin: 11.3 g/dL — ABNORMAL LOW (ref 12.0–15.0)
MCH: 27.4 pg (ref 26.0–34.0)
MCHC: 30.9 g/dL (ref 30.0–36.0)
MCV: 88.8 fL (ref 80.0–100.0)
Platelets: 325 10*3/uL (ref 150–400)
RBC: 4.12 MIL/uL (ref 3.87–5.11)
RDW: 17.1 % — ABNORMAL HIGH (ref 11.5–15.5)
WBC: 9.7 10*3/uL (ref 4.0–10.5)
nRBC: 0 % (ref 0.0–0.2)

## 2022-09-22 LAB — CBG MONITORING, ED
Glucose-Capillary: 187 mg/dL — ABNORMAL HIGH (ref 70–99)
Glucose-Capillary: 115 mg/dL — ABNORMAL HIGH (ref 70–99)

## 2022-09-22 LAB — BRAIN NATRIURETIC PEPTIDE: B Natriuretic Peptide: 80 pg/mL (ref 0.0–100.0)

## 2022-09-22 LAB — HEMOGLOBIN A1C
Hgb A1c MFr Bld: 5.3 % (ref 4.8–5.6)
Mean Plasma Glucose: 105.41 mg/dL

## 2022-09-22 LAB — BASIC METABOLIC PANEL
Anion gap: 11 (ref 5–15)
BUN: 36 mg/dL — ABNORMAL HIGH (ref 8–23)
CO2: 21 mmol/L — ABNORMAL LOW (ref 22–32)
Calcium: 9.1 mg/dL (ref 8.9–10.3)
Chloride: 105 mmol/L (ref 98–111)
Creatinine, Ser: 2.62 mg/dL — ABNORMAL HIGH (ref 0.44–1.00)
GFR, Estimated: 18 mL/min — ABNORMAL LOW (ref >60)
Glucose, Bld: 198 mg/dL — ABNORMAL HIGH (ref 70–99)
Potassium: 3.8 mmol/L (ref 3.5–5.1)
Sodium: 137 mmol/L (ref 135–145)

## 2022-09-22 LAB — GLUCOSE, CAPILLARY
Glucose-Capillary: 107 mg/dL — ABNORMAL HIGH (ref 70–99)
Glucose-Capillary: 100 mg/dL — ABNORMAL HIGH (ref 70–99)
Glucose-Capillary: 100 mg/dL — ABNORMAL HIGH (ref 70–99)

## 2022-09-22 LAB — LIPASE, BLOOD: Lipase: 39 U/L (ref 11–51)

## 2022-09-22 LAB — MAGNESIUM: Magnesium: 1.3 mg/dL — ABNORMAL LOW (ref 1.7–2.4)

## 2022-09-22 MED ORDER — SODIUM CHLORIDE 0.9 % IV SOLN: INTRAVENOUS | Status: AC

## 2022-09-22 MED ORDER — NALOXONE HCL 0.4 MG/ML IJ SOLN: 0.4000 mg | INTRAMUSCULAR | Status: DC | PRN

## 2022-09-22 MED ORDER — MAGNESIUM SULFATE 4 GM/100ML IV SOLN
4.0000 g | Freq: Once | INTRAVENOUS | Status: AC
  Administered 2022-09-22: 4 g via INTRAVENOUS
  Filled 2022-09-22: qty 100

## 2022-09-22 MED ORDER — HYDROMORPHONE HCL 1 MG/ML IJ SOLN: 0.2500 mg | INTRAMUSCULAR | Status: DC | PRN

## 2022-09-22 MED ORDER — SODIUM CHLORIDE 0.9 % IV BOLUS
1000.0000 mL | Freq: Once | INTRAVENOUS | Status: AC
  Administered 2022-09-22: 1000 mL via INTRAVENOUS

## 2022-09-22 MED ORDER — MORPHINE SULFATE (PF) 4 MG/ML IV SOLN
4.0000 mg | Freq: Once | INTRAVENOUS | Status: DC
  Filled 2022-09-22: qty 1

## 2022-09-22 MED ORDER — INSULIN ASPART 100 UNIT/ML IJ SOLN
0.0000 [IU] | INTRAMUSCULAR | Status: DC
  Administered 2022-09-22: 2 [IU] via SUBCUTANEOUS
  Administered 2022-09-23: 1 [IU] via SUBCUTANEOUS
  Administered 2022-09-23: 2 [IU] via SUBCUTANEOUS
  Administered 2022-09-24: 1 [IU] via SUBCUTANEOUS

## 2022-09-22 MED ORDER — MORPHINE SULFATE (PF) 2 MG/ML IV SOLN
2.0000 mg | Freq: Once | INTRAVENOUS | Status: AC
  Administered 2022-09-22: 2 mg via INTRAVENOUS
  Filled 2022-09-22: qty 1

## 2022-09-22 MED ORDER — ONDANSETRON HCL 4 MG/2ML IJ SOLN
4.0000 mg | Freq: Once | INTRAMUSCULAR | Status: AC
  Administered 2022-09-22: 4 mg via INTRAVENOUS
  Filled 2022-09-22: qty 2

## 2022-09-22 MED ORDER — MORPHINE SULFATE (PF) 2 MG/ML IV SOLN
1.0000 mg | INTRAVENOUS | Status: DC | PRN
  Administered 2022-09-22 (×3): 1 mg via INTRAVENOUS
  Filled 2022-09-22 (×3): qty 1

## 2022-09-22 MED ORDER — DIATRIZOATE MEGLUMINE & SODIUM 66-10 % PO SOLN
90.0000 mL | Freq: Once | ORAL | Status: AC
  Administered 2022-09-22: 90 mL via NASOGASTRIC
  Filled 2022-09-22 (×2): qty 90

## 2022-09-22 MED ORDER — SODIUM CHLORIDE 0.9 % IV SOLN: INTRAVENOUS | Status: DC

## 2022-09-22 MED ORDER — ONDANSETRON HCL 4 MG/2ML IJ SOLN
4.0000 mg | Freq: Once | INTRAMUSCULAR | Status: DC
  Filled 2022-09-22: qty 2

## 2022-09-22 NOTE — ED Provider Notes (Signed)
[place] EMERGENCY DEPARTMENT AT [facility] Provider Note   CSN: [id number] Arrival date & time: 09/21/22  2354     History  Chief Complaint  Patient presents with   Abdominal Pain    [name] is a 77 y.o. female.  77 yo F with a cc of not feeling well.  She has trouble describing this.  Is been going on for couple days.  Has not really been able to get up and move around how she normally is.  Started with some left-sided neck pain reportedly.  She is unable to describe this any further.  Tells me she has some periumbilical abdominal pain.  Has been able to eat and drink.  No fevers.  No vomiting.  Some nausea.  No diarrhea.  No urinary symptoms.   Abdominal Pain      Home Medications Prior to Admission medications   Medication Sig Start Date End Date Taking? Authorizing Provider  acetaminophen (TYLENOL) 500 MG tablet Take 1 tablet (500 mg total) by mouth every 6 (six) hours as needed. Patient taking differently: Take 500 mg by mouth every 6 (six) hours as needed for mild pain. 05/11/22   Derwood Kaplan, MD  amLODipine (NORVASC) 10 MG tablet Take 1 tablet (10 mg total) by mouth daily. 03/28/22     apixaban (ELIQUIS) 2.5 MG TABS tablet Take 1 tablet (2.5 mg total) by mouth 2 (two) times daily. 10/30/21   Arrien, York Ram, MD  budesonide (ENTOCORT EC) 3 MG 24 hr capsule Take 1 capsule by mouth once daily Patient taking differently: Take 3 mg by mouth 3 (three) times daily. 11/30/20   Iva Boop, MD  colestipol (COLESTID) 1 g tablet Take 2 g by mouth at bedtime. 07/06/22   [provider]  CREON 36000-114000 units CPEP capsule Take 36,000 Units by mouth 3 (three) times daily with meals. 06/05/22   [provider]  famotidine (PEPCID) 20 MG tablet Take 20 mg by mouth daily as needed for heartburn or indigestion. 10/14/21   [provider]  FLUoxetine (PROZAC) 20 MG capsule Take 20 mg by mouth at bedtime. 10/12/21   [provider]  furosemide (LASIX) 40 MG tablet Take 1 tablet (40 mg total) by mouth daily. Patient taking differently: Take 40 mg by mouth in the morning. 03/28/22     hydrALAZINE (APRESOLINE) 50 MG tablet Take 50 mg by mouth in the morning and at bedtime.    [provider]  loperamide (IMODIUM) 2 MG capsule Take 4 mg by mouth every 6 (six) hours as needed for diarrhea or loose stools. 07/21/22   [provider]  mirtazapine (REMERON) 7.5 MG tablet Take 1 tablet (7.5 mg total) by mouth at bedtime. 12/22/20   Malachy Mood, MD  nitroGLYCERIN (NITROSTAT) 0.4 MG SL tablet DISSOLVE 1 TABLET UNDER THE TONGUE EVERY 5 MINUTES AS  NEEDED FOR CHEST PAIN. MAX  OF 3 TABLETS IN 15 MINUTES. CALL 911 IF PAIN PERSISTS. Patient taking differently: Place 0.4 mg under the tongue every 5 (five) minutes as needed for chest pain. 10/04/20   Jake Bathe, MD  pantoprazole (PROTONIX) 40 MG tablet Take 1 tablet (40 mg total) by mouth 2 (two) times daily. 12/23/21   Jacalyn Lefevre, MD  tiZANidine (ZANAFLEX) 4 MG capsule Take 1 capsule (4 mg total) by mouth 3 (three) times daily. 07/29/22   Hughie Closs, MD      Allergies    Anesthetics, halogenated; Etomidate; Fentanyl; Percocet [oxycodone-acetaminophen];  Diazepam; Lorazepam; Tramadol hcl; Haloperidol; Morphine sulfate; Propoxyphene; and Versed [midazolam]    Review of Systems   Review of Systems  Gastrointestinal:  Positive for abdominal pain.    Physical Exam Updated Vital Signs BP (!) 145/94   Pulse (!) 102   Temp 98.3 F (36.8 C)   Resp 20   SpO2 100%  Physical Exam Vitals and nursing note reviewed.  Constitutional:      General: She is not in acute distress.    Appearance: She is well-developed. She is not diaphoretic.  HENT:     Head: Normocephalic and atraumatic.  Eyes:     Pupils: Pupils are equal, round, and reactive to light.  Cardiovascular:     Rate and Rhythm: Normal rate and regular rhythm.     Heart sounds: No murmur  heard.    No friction rub. No gallop.  Pulmonary:     Effort: Pulmonary effort is normal.     Breath sounds: No wheezing or rales.  Abdominal:     General: There is no distension.     Palpations: Abdomen is soft.     Tenderness: There is no abdominal tenderness.  Musculoskeletal:        General: No tenderness.     Cervical back: Normal range of motion and neck supple.  Skin:    General: Skin is warm and dry.  Neurological:     Mental Status: She is alert and oriented to person, place, and time.  Psychiatric:        Behavior: Behavior normal.     ED Results / Procedures / Treatments   Labs (all labs ordered are listed, but only abnormal results are displayed) Labs Reviewed  COMPREHENSIVE METABOLIC PANEL - Abnormal; Notable for the following components:      Result Value   CO2 18 (*)    Glucose, Bld 269 (*)    BUN 41 (*)    Creatinine, Ser 3.04 (*)    GFR, Estimated 15 (*)    Anion gap 20 (*)    All other components within normal limits  CBC - Abnormal; Notable for the following components:   Hemoglobin 11.3 (*)    RDW 17.1 (*)    All other components within normal limits  LIPASE, BLOOD  URINALYSIS, ROUTINE W REFLEX MICROSCOPIC    EKG EKG Interpretation Date/Time:  Saturday September 22 2022 00:04:32 EDT Ventricular Rate:  102 PR Interval:  136 QRS Duration:  136 QT Interval:  394 QTC Calculation: 513 R Axis:   -76  Text Interpretation: Sinus tachycardia Left axis deviation Right bundle branch block Septal infarct , age undetermined Abnormal ECG No significant change since last tracing Confirmed by Melene Plan [phone]) on 09/22/2022 12:12:37 AM  Radiology CT ABDOMEN PELVIS WO CONTRAST  Addendum Date: 09/22/2022   ADDENDUM REPORT: 09/22/2022 01:37 ADDENDUM: Critical Value/emergent results were called by telephone at the time of interpretation on 09/22/2022 at 1:37 am to provider Lanie Schelling , who verbally acknowledged these results. Electronically Signed   By: Thornell Sartorius  M.D.   On: 09/22/2022 01:37   Result Date: 09/22/2022 CLINICAL DATA:  Abdominal pain, acute, nonlocalized. EXAM: CT ABDOMEN AND PELVIS WITHOUT CONTRAST TECHNIQUE: Multidetector CT imaging of the abdomen and pelvis was performed following the standard protocol without IV contrast. RADIATION DOSE REDUCTION: This exam was performed according to the departmental dose-optimization program which includes automated exposure control, adjustment of the mA and/or kV according to patient size and/or use of iterative reconstruction technique. COMPARISON:  07/28/2022. FINDINGS: Lower chest: Multi-vessel coronary artery calcifications are noted. There is a trace pericardial effusion. No acute abnormality. Hepatobiliary: No focal liver abnormality is seen. Status post cholecystectomy. No biliary dilatation. Pancreas: Unremarkable. No pancreatic ductal dilatation or surrounding inflammatory changes. Spleen: Normal in size without focal abnormality. Adrenals/Urinary Tract: The adrenal glands are within normal limits. A cyst is noted in the mid right kidney. Additional hypodensities are present in the right kidney which are too small to further characterize. No renal calculus or hydronephrosis bilaterally. The bladder is unremarkable. Stomach/Bowel: There is a small hiatal hernia. The stomach is within normal limits. There are mildly distended loops of small bowel in the abdomen with air-fluid levels measuring up to 4.2 cm in diameter in the right lower quadrant. There is distended loop of small bowel with fecalization in the pelvis with surrounding fat stranding with possible transition point in the pelvis in the left lower quadrant,, best seen on coronal image 40, with decompression of the distal ileum. Partial colectomy changes are noted on the right with anastomotic site in the right lower quadrant. A few scattered diverticula are present along the colon without evidence of diverticulitis. Vascular/Lymphatic: Aortic  atherosclerosis. No enlarged abdominal or pelvic lymph nodes. Reproductive: Status post hysterectomy. No adnexal masses. Other: No abdominopelvic ascites. Musculoskeletal: There is soft tissue thickening in the anterior right breast with surgical clips in place. Degenerative changes are present in the thoracolumbar spine. Diffuse increased density is noted in the bones which may be associated with metabolic disease such as renal osteodystrophy. A sclerotic lesion is noted in the femoral head on the left, possible avascular necrosis. No acute osseous abnormality. IMPRESSION: 1. Distended loops of small bowel in the abdomen measuring up to 4.2 cm with fecalization of the small bowel in the pelvis and possible transition point in the left lower quadrant, concerning for small bowel obstruction. Surgical consultation is recommended. 2. Multi-vessel coronary artery calcifications. 3. Aortic atherosclerosis. 4. Remaining incidental findings as described above. Electronically Signed: By: Thornell Sartorius M.D. On: 09/22/2022 01:34    Procedures Procedures    Medications Ordered in ED Medications  morphine (PF) 4 MG/ML injection 4 mg (has no administration in time range)  ondansetron (ZOFRAN) injection 4 mg (has no administration in time range)  sodium chloride 0.9 % bolus 1,000 mL (1,000 mLs Intravenous New Bag/Given 09/22/22 0137)  ondansetron (ZOFRAN) injection 4 mg (4 mg Intravenous Given 09/22/22 0131)  morphine (PF) 2 MG/ML injection 2 mg (2 mg Intravenous Given 09/22/22 0132)    ED Course/ Medical Decision Making/ A&P                             Medical Decision Making Amount and/or Complexity of Data Reviewed Labs: ordered. Radiology: ordered.  Risk Prescription drug management.   77 yo F with a chief complaints of abdominal pain.  Patient has multiple complaints and she does not really feel like discussing it with me.  Most of the history is given to me by the family.  Sounds like she has been in  the bed for the past couple days and they were unable to describe why.  Complaining mostly of abdominal discomfort.  Will obtain a laboratory evaluation and treat pain and nausea CT imaging.  Reassess.  CT scan with concern for small bowel obstruction thought to be a transition point in the left lower quadrant.  This was discussed with the radiologist.   I went  back to asked the patient about prior abdominal surgeries.  She notes she has had them before but is unsure why.  Old records reviewed it appears the patient had a ileocolectomy that ended up testing negative for Crohn's.  Subsequently she had developed a small bowel obstruction and required a right hemicolectomy.  She is also had a hysterectomy and cholecystectomy.  Been many years since she had an abdominal surgery.  Discussed with Dr. Dossie Der, gen surgery will evaluate at bedside.  Will discuss with medicine for admission.   The patients results and plan were reviewed and discussed.   Any x-rays performed were independently reviewed by myself.   Differential diagnosis were considered with the presenting HPI.  Medications  morphine (PF) 4 MG/ML injection 4 mg (has no administration in time range)  ondansetron (ZOFRAN) injection 4 mg (has no administration in time range)  sodium chloride 0.9 % bolus 1,000 mL (1,000 mLs Intravenous New Bag/Given 09/22/22 0137)  ondansetron (ZOFRAN) injection 4 mg (4 mg Intravenous Given 09/22/22 0131)  morphine (PF) 2 MG/ML injection 2 mg (2 mg Intravenous Given 09/22/22 0132)    Vitals:   09/21/22 2358  BP: (!) 145/94  Pulse: (!) 102  Resp: 20  Temp: 98.3 F (36.8 C)  SpO2: 100%    Final diagnoses:  SBO (small bowel obstruction) (HCC)    Admission/ observation were discussed with the admitting physician, patient and/or family and they are comfortable with the plan.          Final Clinical Impression(s) / ED Diagnoses Final diagnoses:  SBO (small bowel obstruction) [facility])    Rx /  DC Orders ED Discharge Orders     None         Melene Plan, DO 09/22/22 [phone]

## 2022-09-22 NOTE — ED Notes (Signed)
ED TO INPATIENT HANDOFF REPORT  ED Nurse Name and Phone #: Baker Pierini  RN [phone]  S Name/Age/Gender [name] 77 y.o. female Room/Bed: 010C/010C  Code Status   Code Status: Full Code  Home/SNF/Other Home Patient oriented to: self, place, time, and situation Is this baseline? Yes   Triage Complete: Triage complete  Chief Complaint SBO (small bowel obstruction) (HCC) [K56.609]  Triage Note Patient reports pain across her abdomen onset yesterday with emesis and fatigue , denies fever or chills .    Allergies Allergies  Allergen Reactions   Anesthetics, Halogenated Other (See Comments)    Per patient's daughter, she can not tolerate anesthetics.    Etomidate Other (See Comments)    Heart stopped during anesthesia   Fentanyl Other (See Comments)    Heart stopped during anesthesia    Percocet [Oxycodone-Acetaminophen] Other (See Comments)    Headaches    Diazepam Other (See Comments)    Agitation   Lorazepam Nausea Only   Tramadol Hcl Nausea And Vomiting   Haloperidol Nausea And Vomiting    **Haldol**   Morphine Sulfate Nausea Only    agitation   Propoxyphene Nausea And Vomiting    **Darvocet** no longer available   Versed [Midazolam] Other (See Comments)    Heart stopped during anesthesia    Level of Care/Admitting Diagnosis ED Disposition     ED Disposition  Admit   Condition  --   Comment  Hospital Area: [facility] [[id number]]  Level of Care: Telemetry Cardiac [[id number]]  May admit patient to [facility] or [facility] if equivalent level of care is available:: Yes  Covid Evaluation: Asymptomatic - no recent exposure (last 10 days) testing not required  Diagnosis: SBO (small bowel obstruction) [facility]) [[id number]]  Admitting Physician: John Giovanni [[id number]]  Attending Physician: John Giovanni [[id number]]  Certification:: I certify this patient will need inpatient services for at least 2 midnights  Estimated Length of Stay:  2          B Medical/Surgery History Past Medical History:  Diagnosis Date   Abdominal discomfort    Chronic N/V/D. Presumptive dx Crohn's dx per elevated p ANCA. Failed Entocort and Pentasa. Sep 2003 - ileocolectomy c anastomosis per Dr Orson Slick 2/2 adhesions - path was hegative for Crohns. EGD, Sm bowel follow through (11/03), and an eteroclysis (10/03) were unrevealing. Cuases hypomag and hypocalcemia.   Adnexal mass 8/03   s/p lap BSO (R ovarian fibroma) & lysis of adhesions   Allergy    Seasonal   Anemia    Multifactorial. Baseline HgB 10-11 ish. B12 def - 150 in 3/10. Fe Def - ferritin 35 3/10. Both are being repleted.   Breast cancer (HCC) 03/16/13   right, 5 o'clock   CAD (coronary artery disease) 1996   1996 - PTCA and angioplasty diagonal branch. 2000 - Rotoblator & angiopllasty of diagonal. 2006 - subendocardial AMI, DES to proximal LAD.Marland Kitchen Also had 90% stenosis in distal apical LAD. EF 55 with apical hypokinesis. Indefinite ASA and Plavix.   CHF (congestive heart failure) (HCC)    Chronic kidney disease    Chronic renal insuff baseline Cr 1.2 - 1.4 ish.   Chronic kidney disease, stage III (moderate) (HCC)    Chronic renal insuff baseline Cr 1.2 - 1.4 ish. New baseline about 2 (2018)   Chronic pain    CT 10/10 = Spinal stenosis L2 - S1.   COVID-19 11/02/2020   Diabetes mellitus    Insulin dependent  Diabetes mellitus type 2 in obese 05/03/2010   Managed on lantus and novolog. Has diabetic nephropathy. Metformin D/C'd 2012 2/2 creatinine.  No diabetic retinopathy per 3/11.    Gout    Hx of radiation therapy 06/02/13- 07/16/13   right rbeast 4500 cGy 25 sessions, right breast boost 1600 cGy in 8 sessions   Hyperlipidemia    Managed with both a statin and Welchol. Welchol stopped 2014 2/2 cost and started on fenofibrate    Hypertension    2006 B renal arteries patent. 2003 MRA - no RAS. 2003 pheo W/U Dr Caryn Section reportedly negative.   Hypoxia 07/23/2017   Lupus (HCC)     Lymphedema of breast    Personal history of radiation therapy 2015   Pneumonia of left lung due to infectious organism    RBBB    Renal vein thrombosis (HCC)    SBO (small bowel obstruction) (HCC) 09/17/2017   Secondary hyperparathyroidism (HCC)    Stroke (HCC)    Incidental finding MRI 2002 L lacunar infarct   Vitamin B12 deficiency    Vitamin D deficiency    Wears dentures    top   Past Surgical History:  Procedure Laterality Date   ABDOMINAL HYSTERECTOMY     BILATERAL SALPINGOOPHORECTOMY  8/03   Lap BSO (R ovarian fibroma) and adhesion lysis   BIOPSY  09/08/2020   Procedure: BIOPSY;  Surgeon: Bernette Redbird, MD;  Location: [facility];  Service: Endoscopy;;   BOWEL RESECTION  2003   ileocolectomy with anastomosis 2/2 adhesions   BREAST BIOPSY Right 2015   BREAST BIOPSY Right 07/2014   BREAST LUMPECTOMY Right 04/22/2013   BREAST LUMPECTOMY WITH NEEDLE LOCALIZATION AND AXILLARY SENTINEL LYMPH NODE BX Right 04/22/2013   Procedure: BREAST LUMPECTOMY WITH NEEDLE LOCALIZATION AND AXILLARY SENTINEL LYMPH NODE BX;  Surgeon: Almond Lint, MD;  Location: [facility];  Service: General;  Laterality: Right;   CARDIAC CATHETERIZATION     2 stents   CHOLECYSTECTOMY     COLONOSCOPY     ESOPHAGOGASTRODUODENOSCOPY N/A 09/08/2020   Procedure: ESOPHAGOGASTRODUODENOSCOPY (EGD);  Surgeon: Bernette Redbird, MD;  Location: [facility];  Service: Endoscopy;  Laterality: N/A;   HEMICOLECTOMY     R sided hemicolectomy   HERNIA REPAIR     Ventral hernia repair   PTCA  4/06     A IV Location/Drains/Wounds Patient Lines/Drains/Airways Status     Active Line/Drains/Airways     Name Placement date Placement time Site Days   Peripheral IV 09/22/22 22 G Posterior;Right Hand 09/22/22  0130  Hand  less than 1   NG/OG Vented/Dual Lumen 16 Fr. Right nare Marking at nare/corner of mouth 09/22/22  0310  Right nare  less than 1            Intake/Output Last 24 hours No intake or  output data in the 24 hours ending 09/22/22 1214  Labs/Imaging Results for orders placed or performed during the hospital encounter of 09/21/22 (from the past 48 hour(s))  Lipase, blood     Status: None   Collection Time: 09/22/22 12:04 AM  Result Value Ref Range   Lipase 39 11 - 51 U/L    Comment: Performed at [facility], [address], [place], [place] [address]  Comprehensive metabolic panel     Status: Abnormal   Collection Time: 09/22/22 12:04 AM  Result Value Ref Range   Sodium 141 135 - 145 mmol/L   Potassium 4.4 3.5 - 5.1 mmol/L   Chloride  103 98 - 111 mmol/L   CO2 18 (L) 22 - 32 mmol/L   Glucose, Bld 269 (H) 70 - 99 mg/dL    Comment: Glucose reference range applies only to samples taken after fasting for at least 8 hours.   BUN 41 (H) 8 - 23 mg/dL   Creatinine, Ser 4.09 (H) 0.44 - 1.00 mg/dL   Calcium 9.9 8.9 - 81.1 mg/dL   Total Protein 7.9 6.5 - 8.1 g/dL   Albumin 3.5 3.5 - 5.0 g/dL   AST 19 15 - 41 U/L   ALT 14 0 - 44 U/L   Alkaline Phosphatase 85 38 - 126 U/L   Total Bilirubin 0.7 0.3 - 1.2 mg/dL   GFR, Estimated 15 (L) >60 mL/min    Comment: (NOTE) Calculated using the CKD-EPI Creatinine Equation (2021)    Anion gap 20 (H) 5 - 15    Comment: Performed at [facility], [address], [place], [place] [address]  CBC     Status: Abnormal   Collection Time: 09/22/22 12:04 AM  Result Value Ref Range   WBC 9.7 4.0 - 10.5 K/uL   RBC 4.12 3.87 - 5.11 MIL/uL   Hemoglobin 11.3 (L) 12.0 - 15.0 g/dL   HCT 29.5 62.1 - 30.8 %   MCV 88.8 80.0 - 100.0 fL   MCH 27.4 26.0 - 34.0 pg   MCHC 30.9 30.0 - 36.0 g/dL   RDW 65.7 (H) 84.6 - 96.2 %   Platelets 325 150 - 400 K/uL   nRBC 0.0 0.0 - 0.2 %    Comment: Performed at [facility], [address] [place], [place] [address]  CBG monitoring, ED     Status: Abnormal   Collection Time: 09/22/22  4:27 AM  Result Value Ref Range   Glucose-Capillary 187 (H) 70 - 99 mg/dL    Comment: Glucose reference range  applies only to samples taken after fasting for at least 8 hours.  Basic metabolic panel     Status: Abnormal   Collection Time: 09/22/22  4:42 AM  Result Value Ref Range   Sodium 137 135 - 145 mmol/L   Potassium 3.8 3.5 - 5.1 mmol/L   Chloride 105 98 - 111 mmol/L   CO2 21 (L) 22 - 32 mmol/L   Glucose, Bld 198 (H) 70 - 99 mg/dL    Comment: Glucose reference range applies only to samples taken after fasting for at least 8 hours.   BUN 36 (H) 8 - 23 mg/dL   Creatinine, Ser 1.32 (H) 0.44 - 1.00 mg/dL   Calcium 9.1 8.9 - 44.0 mg/dL   GFR, Estimated 18 (L) >60 mL/min    Comment: (NOTE) Calculated using the CKD-EPI Creatinine Equation (2021)    Anion gap 11 5 - 15    Comment: Performed at [facility], [address], [place], [place] [address]  Hemoglobin A1c     Status: None   Collection Time: 09/22/22  4:42 AM  Result Value Ref Range   Hgb A1c MFr Bld 5.3 4.8 - 5.6 %    Comment: (NOTE) Pre diabetes:          5.7%-6.4%  Diabetes:              >6.4%  Glycemic control for   <7.0% adults with diabetes    Mean Plasma Glucose 105.41 mg/dL    Comment: Performed at [facility], [address] [place], [place] [address]  Magnesium  Status: Abnormal   Collection Time: 09/22/22  4:42 AM  Result Value Ref Range   Magnesium 1.3 (L) 1.7 - 2.4 mg/dL    Comment: Performed at [facility], [address] [place], [place] [address]  Brain natriuretic peptide     Status: None   Collection Time: 09/22/22  4:42 AM  Result Value Ref Range   B Natriuretic Peptide 80.0 0.0 - 100.0 pg/mL    Comment: Performed at [facility], [address], [place], [place] [address]  CBG monitoring, ED     Status: Abnormal   Collection Time: 09/22/22  7:44 AM  Result Value Ref Range   Glucose-Capillary 115 (H) 70 - 99 mg/dL    Comment: Glucose reference range applies only to samples taken after fasting for at least 8 hours.   Comment 1 Notify RN    *Note: Due to a large number of  results and/or encounters for the requested time period, some results have not been displayed. A complete set of results can be found in Results Review.   DG Abd Portable 1V-Small Bowel Protocol-Position Verification  Result Date: 09/22/2022 CLINICAL DATA:  Confirm nasogastric tube placement EXAM: PORTABLE ABDOMEN - 1 VIEW COMPARISON:  Earlier today FINDINGS: Enteric tube tip and side-port overlaps the stomach. Normal bowel gas pattern. Over penetrated lung parenchyma on the view over the chest. Normal heart size. IMPRESSION: Enteric tube with tip and side port at the stomach. Electronically Signed   By: Tiburcio Pea M.D.   On: 09/22/2022 08:06   DG Abd Portable 1 View  Result Date: 09/22/2022 CLINICAL DATA:  NG tube placement EXAM: PORTABLE ABDOMEN - 1 VIEW COMPARISON:  11/30/2020 FINDINGS: NG tube tip is in the stomach with the side port near the GE junction. IMPRESSION: NG tube tip in the stomach. Electronically Signed   By: Charlett Nose M.D.   On: 09/22/2022 03:56   CT ABDOMEN PELVIS WO CONTRAST  Addendum Date: 09/22/2022   ADDENDUM REPORT: 09/22/2022 01:37 ADDENDUM: Critical Value/emergent results were called by telephone at the time of interpretation on 09/22/2022 at 1:37 am to provider DAN FLOYD , who verbally acknowledged these results. Electronically Signed   By: Thornell Sartorius M.D.   On: 09/22/2022 01:37   Result Date: 09/22/2022 CLINICAL DATA:  Abdominal pain, acute, nonlocalized. EXAM: CT ABDOMEN AND PELVIS WITHOUT CONTRAST TECHNIQUE: Multidetector CT imaging of the abdomen and pelvis was performed following the standard protocol without IV contrast. RADIATION DOSE REDUCTION: This exam was performed according to the departmental dose-optimization program which includes automated exposure control, adjustment of the mA and/or kV according to patient size and/or use of iterative reconstruction technique. COMPARISON:  07/28/2022. FINDINGS: Lower chest: Multi-vessel coronary artery calcifications  are noted. There is a trace pericardial effusion. No acute abnormality. Hepatobiliary: No focal liver abnormality is seen. Status post cholecystectomy. No biliary dilatation. Pancreas: Unremarkable. No pancreatic ductal dilatation or surrounding inflammatory changes. Spleen: Normal in size without focal abnormality. Adrenals/Urinary Tract: The adrenal glands are within normal limits. A cyst is noted in the mid right kidney. Additional hypodensities are present in the right kidney which are too small to further characterize. No renal calculus or hydronephrosis bilaterally. The bladder is unremarkable. Stomach/Bowel: There is a small hiatal hernia. The stomach is within normal limits. There are mildly distended loops of small bowel in the abdomen with air-fluid levels measuring up to 4.2 cm in diameter in the right lower quadrant. There is distended loop of small bowel with  fecalization in the pelvis with surrounding fat stranding with possible transition point in the pelvis in the left lower quadrant,, best seen on coronal image 40, with decompression of the distal ileum. Partial colectomy changes are noted on the right with anastomotic site in the right lower quadrant. A few scattered diverticula are present along the colon without evidence of diverticulitis. Vascular/Lymphatic: Aortic atherosclerosis. No enlarged abdominal or pelvic lymph nodes. Reproductive: Status post hysterectomy. No adnexal masses. Other: No abdominopelvic ascites. Musculoskeletal: There is soft tissue thickening in the anterior right breast with surgical clips in place. Degenerative changes are present in the thoracolumbar spine. Diffuse increased density is noted in the bones which may be associated with metabolic disease such as renal osteodystrophy. A sclerotic lesion is noted in the femoral head on the left, possible avascular necrosis. No acute osseous abnormality. IMPRESSION: 1. Distended loops of small bowel in the abdomen measuring up  to 4.2 cm with fecalization of the small bowel in the pelvis and possible transition point in the left lower quadrant, concerning for small bowel obstruction. Surgical consultation is recommended. 2. Multi-vessel coronary artery calcifications. 3. Aortic atherosclerosis. 4. Remaining incidental findings as described above. Electronically Signed: By: Thornell Sartorius M.D. On: 09/22/2022 01:34    Pending Labs Unresulted Labs (From admission, onward)     Start     Ordered   09/22/22 0002  Urinalysis, Routine w reflex microscopic -Urine, Clean Catch  Once,   URGENT       Question:  Specimen Source  Answer:  Urine, Clean Catch   09/22/22 0001            Vitals/Pain Today's Vitals   09/22/22 0845 09/22/22 0900 09/22/22 1200 09/22/22 1214  BP: (!) 157/84 (!) 173/80 (!) 169/75   Pulse: 91 88 91   Resp: 11 (!) 9 17   Temp:    98.3 F (36.8 C)  TempSrc:    Oral  SpO2: 98% 97% 99%   Weight:      Height:      PainSc:        Isolation Precautions No active isolations  Medications Medications  0.9 %  sodium chloride infusion ( Intravenous New Bag/Given 09/22/22 0416)  insulin aspart (novoLOG) injection 0-9 Units (0 Units Subcutaneous Hold 09/22/22 0747)  morphine (PF) 2 MG/ML injection 1 mg (1 mg Intravenous Given 09/22/22 1209)  naloxone [facility]) injection 0.4 mg (has no administration in time range)  sodium chloride 0.9 % bolus 1,000 mL (0 mLs Intravenous Stopped 09/22/22 0418)  ondansetron (ZOFRAN) injection 4 mg (4 mg Intravenous Given 09/22/22 0131)  morphine (PF) 2 MG/ML injection 2 mg (2 mg Intravenous Given 09/22/22 0132)  diatrizoate meglumine-sodium (GASTROGRAFIN) 66-10 % solution 90 mL (90 mLs Per NG tube Given 09/22/22 1016)    Mobility walks     Focused Assessments Ng tube in place set to wall suction. Pt tolerating well.     R Recommendations: See Admitting Provider Note  Report given to:   Additional Notes: pt given contrast via NG tube for xray in 8 hours meds given at  1025.

## 2022-09-22 NOTE — H&P (Signed)
History and Physical    Nikeya [place] Nicolaou ZOX:[id number] DOB: 1945-09-22 DOA: 09/21/2022  PCP: Renford Dills, MD  Patient coming from: Home  Chief Complaint: Abdominal pain  HPI: Quetzal [place] Magro is a 77 y.o. female with medical history significant of CAD, CKD stage IV, type 2 diabetes, lupus, CVA, peripheral neuropathy, chronic HFpEF, history of DVT and renal vein thrombosis on Eliquis, hyperlipidemia, hypertension, GERD, gout, history of prior abdominal surgeries and SBO presented to the ED with abdominal pain.  Vital signs on arrival: Temperature 98.3 F, pulse 102, respiratory rate 20, blood pressure 145/94, and SpO2 100% on room air.  Labs showing no leukocytosis, hemoglobin 11.3 (stable), bicarb 18 (chronically low and stable), anion gap 20, glucose 269, BUN 41, creatinine 3.0 (baseline 2.3-2.6), lipase and LFTs normal, UA pending.  CT concerning for SBO with possible transition point in the left lower quadrant.  General surgery consulted (Dr. Dossie Der) and NG tube placed.  Patient received morphine, Zofran, and 1 L normal saline bolus in ED.  [facility] called to admit.  History provided by the patient and her son at bedside.  She started having abdominal pain and distention yesterday and had multiple episodes of vomiting at home.  Last bowel movement was 2 days ago.  Patient is reporting history of previous abdominal surgeries and bowel obstruction.  She reports relief of her symptoms after placement of NG tube.  No other complaints.  Review of Systems:  Review of Systems  All other systems reviewed and are negative.   Past Medical History:  Diagnosis Date   Abdominal discomfort    Chronic N/V/D. Presumptive dx Crohn's dx per elevated p ANCA. Failed Entocort and Pentasa. Sep 2003 - ileocolectomy c anastomosis per Dr Orson Slick 2/2 adhesions - path was hegative for Crohns. EGD, Sm bowel follow through (11/03), and an eteroclysis (10/03) were unrevealing. Cuases hypomag and hypocalcemia.    Adnexal mass 8/03   s/p lap BSO (R ovarian fibroma) & lysis of adhesions   Allergy    Seasonal   Anemia    Multifactorial. Baseline HgB 10-11 ish. B12 def - 150 in 3/10. Fe Def - ferritin 35 3/10. Both are being repleted.   Breast cancer (HCC) 03/16/13   right, 5 o'clock   CAD (coronary artery disease) 1996   1996 - PTCA and angioplasty diagonal branch. 2000 - Rotoblator & angiopllasty of diagonal. 2006 - subendocardial AMI, DES to proximal LAD.Marland Kitchen Also had 90% stenosis in distal apical LAD. EF 55 with apical hypokinesis. Indefinite ASA and Plavix.   CHF (congestive heart failure) (HCC)    Chronic kidney disease    Chronic renal insuff baseline Cr 1.2 - 1.4 ish.   Chronic kidney disease, stage III (moderate) (HCC)    Chronic renal insuff baseline Cr 1.2 - 1.4 ish. New baseline about 2 (2018)   Chronic pain    CT 10/10 = Spinal stenosis L2 - S1.   COVID-19 11/02/2020   Diabetes mellitus    Insulin dependent   Diabetes mellitus type 2 in obese 05/03/2010   Managed on lantus and novolog. Has diabetic nephropathy. Metformin D/C'd 2012 2/2 creatinine.  No diabetic retinopathy per 3/11.    Gout    Hx of radiation therapy 06/02/13- 07/16/13   right rbeast 4500 cGy 25 sessions, right breast boost 1600 cGy in 8 sessions   Hyperlipidemia    Managed with both a statin and Welchol. Welchol stopped 2014 2/2 cost and started on fenofibrate    Hypertension    2006  B renal arteries patent. 2003 MRA - no RAS. 2003 pheo W/U Dr Caryn Section reportedly negative.   Hypoxia 07/23/2017   Lupus (HCC)    Lymphedema of breast    Personal history of radiation therapy 2015   Pneumonia of left lung due to infectious organism    RBBB    Renal vein thrombosis (HCC)    SBO (small bowel obstruction) (HCC) 09/17/2017   Secondary hyperparathyroidism (HCC)    Stroke (HCC)    Incidental finding MRI 2002 L lacunar infarct   Vitamin B12 deficiency    Vitamin D deficiency    Wears dentures    top    Past Surgical History:   Procedure Laterality Date   ABDOMINAL HYSTERECTOMY     BILATERAL SALPINGOOPHORECTOMY  8/03   Lap BSO (R ovarian fibroma) and adhesion lysis   BIOPSY  09/08/2020   Procedure: BIOPSY;  Surgeon: Bernette Redbird, MD;  Location: [facility];  Service: Endoscopy;;   BOWEL RESECTION  2003   ileocolectomy with anastomosis 2/2 adhesions   BREAST BIOPSY Right 2015   BREAST BIOPSY Right 07/2014   BREAST LUMPECTOMY Right 04/22/2013   BREAST LUMPECTOMY WITH NEEDLE LOCALIZATION AND AXILLARY SENTINEL LYMPH NODE BX Right 04/22/2013   Procedure: BREAST LUMPECTOMY WITH NEEDLE LOCALIZATION AND AXILLARY SENTINEL LYMPH NODE BX;  Surgeon: Almond Lint, MD;  Location: [facility];  Service: General;  Laterality: Right;   CARDIAC CATHETERIZATION     2 stents   CHOLECYSTECTOMY     COLONOSCOPY     ESOPHAGOGASTRODUODENOSCOPY N/A 09/08/2020   Procedure: ESOPHAGOGASTRODUODENOSCOPY (EGD);  Surgeon: Bernette Redbird, MD;  Location: [facility];  Service: Endoscopy;  Laterality: N/A;   HEMICOLECTOMY     R sided hemicolectomy   HERNIA REPAIR     Ventral hernia repair   PTCA  4/06     reports that she has never smoked. She has never used smokeless tobacco. She reports that she does not drink alcohol and does not use drugs.  Allergies  Allergen Reactions   Anesthetics, Halogenated Other (See Comments)    Per patient's daughter, she can not tolerate anesthetics.    Etomidate Other (See Comments)    Heart stopped during anesthesia   Fentanyl Other (See Comments)    Heart stopped during anesthesia    Percocet [Oxycodone-Acetaminophen] Other (See Comments)    Headaches    Diazepam Other (See Comments)    Agitation   Lorazepam Nausea Only   Tramadol Hcl Nausea And Vomiting   Haloperidol Nausea And Vomiting    **Haldol**   Morphine Sulfate Nausea Only    agitation   Propoxyphene Nausea And Vomiting    **Darvocet** no longer available   Versed [Midazolam] Other (See Comments)    Heart stopped  during anesthesia    Family History  Problem Relation Age of Onset   Pancreatic cancer Brother 52   Breast cancer Mother 90   Lung cancer Maternal Aunt    Breast cancer Maternal Aunt    Prostate cancer Maternal Uncle    Heart attack Maternal Grandmother    Breast cancer Maternal Grandmother    Colon cancer Maternal Grandmother    Clotting disorder Maternal Grandmother    Diabetes Maternal Grandmother    Kidney failure Maternal Aunt    Hypertension Daughter     Prior to Admission medications   Medication Sig Start Date End Date Taking? Authorizing Provider  acetaminophen (TYLENOL) 500 MG tablet Take 1 tablet (500 mg total) by mouth every 6 (six) hours as  needed. Patient taking differently: Take 500 mg by mouth every 6 (six) hours as needed for mild pain. 05/11/22   Derwood Kaplan, MD  amLODipine (NORVASC) 10 MG tablet Take 1 tablet (10 mg total) by mouth daily. 03/28/22     apixaban (ELIQUIS) 2.5 MG TABS tablet Take 1 tablet (2.5 mg total) by mouth 2 (two) times daily. 10/30/21   Arrien, York Ram, MD  budesonide (ENTOCORT EC) 3 MG 24 hr capsule Take 1 capsule by mouth once daily Patient taking differently: Take 3 mg by mouth 3 (three) times daily. 11/30/20   Iva Boop, MD  colestipol (COLESTID) 1 g tablet Take 2 g by mouth at bedtime. 07/06/22   [provider]  CREON 36000-114000 units CPEP capsule Take 36,000 Units by mouth 3 (three) times daily with meals. 06/05/22   [provider]  famotidine (PEPCID) 20 MG tablet Take 20 mg by mouth daily as needed for heartburn or indigestion. 10/14/21   [provider]  FLUoxetine (PROZAC) 20 MG capsule Take 20 mg by mouth at bedtime. 10/12/21   [provider]  furosemide (LASIX) 40 MG tablet Take 1 tablet (40 mg total) by mouth daily. Patient taking differently: Take 40 mg by mouth in the morning. 03/28/22     hydrALAZINE (APRESOLINE) 50 MG tablet Take 50 mg by mouth in the morning and at bedtime.     [provider]  loperamide (IMODIUM) 2 MG capsule Take 4 mg by mouth every 6 (six) hours as needed for diarrhea or loose stools. 07/21/22   [provider]  mirtazapine (REMERON) 7.5 MG tablet Take 1 tablet (7.5 mg total) by mouth at bedtime. 12/22/20   Malachy Mood, MD  nitroGLYCERIN (NITROSTAT) 0.4 MG SL tablet DISSOLVE 1 TABLET UNDER THE TONGUE EVERY 5 MINUTES AS  NEEDED FOR CHEST PAIN. MAX  OF 3 TABLETS IN 15 MINUTES. CALL 911 IF PAIN PERSISTS. Patient taking differently: Place 0.4 mg under the tongue every 5 (five) minutes as needed for chest pain. 10/04/20   Jake Bathe, MD  pantoprazole (PROTONIX) 40 MG tablet Take 1 tablet (40 mg total) by mouth 2 (two) times daily. 12/23/21   Jacalyn Lefevre, MD  tiZANidine (ZANAFLEX) 4 MG capsule Take 1 capsule (4 mg total) by mouth 3 (three) times daily. 07/29/22   Hughie Closs, MD    Physical Exam: Vitals:   09/21/22 2358  BP: (!) 145/94  Pulse: (!) 102  Resp: 20  Temp: 98.3 F (36.8 C)  SpO2: 100%    Physical Exam Vitals reviewed.  Constitutional:      General: She is not in acute distress. HENT:     Head: Normocephalic and atraumatic.  Eyes:     Extraocular Movements: Extraocular movements intact.  Cardiovascular:     Rate and Rhythm: Normal rate and regular rhythm.     Pulses: Normal pulses.  Pulmonary:     Effort: Pulmonary effort is normal. No respiratory distress.     Breath sounds: Normal breath sounds. No wheezing or rales.  Abdominal:     General: There is distension.     Palpations: Abdomen is soft.     Tenderness: There is no abdominal tenderness. There is no guarding.     Comments: Bowel sounds present  Musculoskeletal:     Cervical back: Normal range of motion.     Right lower leg: No edema.     Left lower leg: No edema.  Skin:    General: Skin is warm and  dry.  Neurological:     General: No focal deficit present.     Mental Status: She is alert and oriented to person, place, and time.     Labs  on Admission: I have personally reviewed following labs and imaging studies  CBC: Recent Labs  Lab 09/22/22 0004  WBC 9.7  HGB 11.3*  HCT 36.6  MCV 88.8  PLT 325   Basic Metabolic Panel: Recent Labs  Lab 09/22/22 0004  NA 141  K 4.4  CL 103  CO2 18*  GLUCOSE 269*  BUN 41*  CREATININE 3.04*  CALCIUM 9.9   GFR: CrCl cannot be calculated (Unknown ideal weight.). Liver Function Tests: Recent Labs  Lab 09/22/22 0004  AST 19  ALT 14  ALKPHOS 85  BILITOT 0.7  PROT 7.9  ALBUMIN 3.5   Recent Labs  Lab 09/22/22 0004  LIPASE 39   No results for input(s): "AMMONIA" in the last 168 hours. Coagulation Profile: No results for input(s): "INR", "PROTIME" in the last 168 hours. Cardiac Enzymes: No results for input(s): "CKTOTAL", "CKMB", "CKMBINDEX", "TROPONINI" in the last 168 hours. BNP (last 3 results) No results for input(s): "PROBNP" in the last 8760 hours. HbA1C: No results for input(s): "HGBA1C" in the last 72 hours. CBG: No results for input(s): "GLUCAP" in the last 168 hours. Lipid Profile: No results for input(s): "CHOL", "HDL", "LDLCALC", "TRIG", "CHOLHDL", "LDLDIRECT" in the last 72 hours. Thyroid Function Tests: No results for input(s): "TSH", "T4TOTAL", "FREET4", "T3FREE", "THYROIDAB" in the last 72 hours. Anemia Panel: No results for input(s): "VITAMINB12", "FOLATE", "FERRITIN", "TIBC", "IRON", "RETICCTPCT" in the last 72 hours. Urine analysis:    Component Value Date/Time   COLORURINE YELLOW 07/28/2022 1618   APPEARANCEUR HAZY (A) 07/28/2022 1618   APPEARANCEUR Clear 08/22/2018 1620   LABSPEC 1.013 07/28/2022 1618   LABSPEC 1.020 03/03/2014 1341   PHURINE 5.0 07/28/2022 1618   GLUCOSEU NEGATIVE 07/28/2022 1618   GLUCOSEU Negative 03/03/2014 1341   HGBUR NEGATIVE 07/28/2022 1618   BILIRUBINUR NEGATIVE 07/28/2022 1618   BILIRUBINUR Negative 08/22/2018 1620   BILIRUBINUR Negative 03/03/2014 1341   KETONESUR NEGATIVE 07/28/2022 1618   PROTEINUR  100 (A) 07/28/2022 1618   UROBILINOGEN 0.2 03/03/2014 1341   NITRITE NEGATIVE 07/28/2022 1618   LEUKOCYTESUR TRACE (A) 07/28/2022 1618   LEUKOCYTESUR Negative 03/03/2014 1341    Radiological Exams on Admission: CT ABDOMEN PELVIS WO CONTRAST  Addendum Date: 09/22/2022   ADDENDUM REPORT: 09/22/2022 01:37 ADDENDUM: Critical Value/emergent results were called by telephone at the time of interpretation on 09/22/2022 at 1:37 am to provider DAN FLOYD , who verbally acknowledged these results. Electronically Signed   By: Thornell Sartorius M.D.   On: 09/22/2022 01:37   Result Date: 09/22/2022 CLINICAL DATA:  Abdominal pain, acute, nonlocalized. EXAM: CT ABDOMEN AND PELVIS WITHOUT CONTRAST TECHNIQUE: Multidetector CT imaging of the abdomen and pelvis was performed following the standard protocol without IV contrast. RADIATION DOSE REDUCTION: This exam was performed according to the departmental dose-optimization program which includes automated exposure control, adjustment of the mA and/or kV according to patient size and/or use of iterative reconstruction technique. COMPARISON:  07/28/2022. FINDINGS: Lower chest: Multi-vessel coronary artery calcifications are noted. There is a trace pericardial effusion. No acute abnormality. Hepatobiliary: No focal liver abnormality is seen. Status post cholecystectomy. No biliary dilatation. Pancreas: Unremarkable. No pancreatic ductal dilatation or surrounding inflammatory changes. Spleen: Normal in size without focal abnormality. Adrenals/Urinary Tract: The adrenal glands are within normal limits. A cyst is noted in the mid  right kidney. Additional hypodensities are present in the right kidney which are too small to further characterize. No renal calculus or hydronephrosis bilaterally. The bladder is unremarkable. Stomach/Bowel: There is a small hiatal hernia. The stomach is within normal limits. There are mildly distended loops of small bowel in the abdomen with air-fluid levels  measuring up to 4.2 cm in diameter in the right lower quadrant. There is distended loop of small bowel with fecalization in the pelvis with surrounding fat stranding with possible transition point in the pelvis in the left lower quadrant,, best seen on coronal image 40, with decompression of the distal ileum. Partial colectomy changes are noted on the right with anastomotic site in the right lower quadrant. A few scattered diverticula are present along the colon without evidence of diverticulitis. Vascular/Lymphatic: Aortic atherosclerosis. No enlarged abdominal or pelvic lymph nodes. Reproductive: Status post hysterectomy. No adnexal masses. Other: No abdominopelvic ascites. Musculoskeletal: There is soft tissue thickening in the anterior right breast with surgical clips in place. Degenerative changes are present in the thoracolumbar spine. Diffuse increased density is noted in the bones which may be associated with metabolic disease such as renal osteodystrophy. A sclerotic lesion is noted in the femoral head on the left, possible avascular necrosis. No acute osseous abnormality. IMPRESSION: 1. Distended loops of small bowel in the abdomen measuring up to 4.2 cm with fecalization of the small bowel in the pelvis and possible transition point in the left lower quadrant, concerning for small bowel obstruction. Surgical consultation is recommended. 2. Multi-vessel coronary artery calcifications. 3. Aortic atherosclerosis. 4. Remaining incidental findings as described above. Electronically Signed: By: Thornell Sartorius M.D. On: 09/22/2022 01:34    EKG: Independently reviewed.  Sinus tachycardia, RBBB, LAFB, QTc 513.  No significant change since prior tracing.  Assessment and Plan  SBO Patient with history of prior abdominal surgeries and SBO presenting with complaints of abdominal pain/distention and vomiting since yesterday.  CT concerning for SBO with possible transition point in the left lower quadrant.  General  surgery consulted and NG tube placed.  Keep n.p.o., gentle IV fluid hydration, pain management.  CAD Stable, not endorsing any anginal symptoms and EKG without acute ischemic changes.  AKI on CKD stage IV Creatinine currently 3.0 and baseline appears to be 2.3-2.6.  Continue gentle IV fluid hydration and monitor renal function.  Avoid nephrotoxic agents/IV contrast.  Type 2 diabetes A1c 5.5 in August 2023, will repeat.  Sensitive sliding scale insulin every 4 hours.  Chronic HFpEF Mild to moderate MVR Per echo done in August 2023.  No signs of volume overload, check BNP.  Mild to moderate TVR Per echo done in August 2023.  History of DVT in 2022 and renal vein thrombosis in 2019 She is chronically anticoagulated with Eliquis.  Hold anticoagulation until patient is evaluated by general surgery.  QT prolongation Potassium within normal range, check magnesium level.  Avoid QT prolonging drugs.  Lupus Hyperlipidemia Hypertension GERD Gout Pharmacy med rec pending.  DVT prophylaxis: SCDs Code Status: Full Code (discussed with the patient and her family) Family Communication: Patient's son and daughter-in-law at bedside. Consults called: General surgery Level of care: Telemetry bed Admission status: It is my clinical opinion that admission to INPATIENT is reasonable and necessary because of the expectation that this patient will require hospital care that crosses at least 2 midnights to treat this condition based on the medical complexity of the problems presented.  Given the aforementioned information, the predictability of an adverse outcome is  felt to be significant.   John Giovanni MD [facility]  If 7PM-7AM, please contact night-coverage [email or web address]  09/22/2022, 2:55 AM

## 2022-09-22 NOTE — Progress Notes (Addendum)
PROGRESS NOTE    Neena [place] Stopa  ZOX:[id number] DOB: [birth year] DOA: 09/21/2022 PCP: Renford Dills, MD  Outpatient Specialists:     Brief Narrative:  Patient is a 77 year old African-American female with past medical history significant for chronic kidney disease stage IV, coronary artery disease, type 2 diabetes mellitus, CVA, peripheral neuropathy, chronic heart failure, DVT, hypertension, hyperlipidemia, GERD, gout, renal vein thrombosis on Eliquis.  Patient has had prior abdominal surgery.  Patient was admitted with abdominal pain and distention.  Workup revealed SBO.  Patient currently has NG tube to low suction.  Surgical input is appreciated.  Patient is being managed conservatively for now.  09/22/2022: Patient was admitted earlier today.  Patient seen alongside patient's cousin.  No new complaints.  Above documentation noted.   Assessment & Plan:   Principal Problem:   SBO (small bowel obstruction) (HCC) Active Problems:   CAD (coronary artery disease)   Type 2 diabetes mellitus (HCC)   Acute kidney injury superimposed on chronic kidney disease (HCC)   Chronic heart failure with preserved ejection fraction (HFpEF) (HCC)   Mitral regurgitation   Tricuspid regurgitation   SBO Patient with history of prior abdominal surgeries and SBO presenting with complaints of abdominal pain/distention and vomiting since yesterday.  CT concerning for SBO with possible transition point in the left lower quadrant.  General surgery consulted and NG tube placed.  Keep n.p.o., gentle IV fluid hydration, pain management. 09/22/2022: See above documentation.  Can-9.   CAD Stable, not endorsing any anginal symptoms and EKG without acute ischemic changes.   AKI on CKD stage IV Creatinine currently 3.0 and baseline appears to be 2.3-2.6.  Continue gentle IV fluid hydration and monitor renal function.  Avoid nephrotoxic agents/IV contrast. 09/22/2022: Serum creatinine has improved from 3.04-2.62.   Continue IV fluids.   Type 2 diabetes A1c 5.5 in August 2023, will repeat.  Sensitive sliding scale insulin every 4 hours.   Chronic HFpEF Mild to moderate MVR Per echo done in August 2023.  No signs of volume overload, check BNP.   Mild to moderate TVR Per echo done in August 2023.   History of DVT in 2022 and renal vein thrombosis in 2019 She is chronically anticoagulated with Eliquis.  Hold anticoagulation until patient is evaluated by general surgery.   QT prolongation Potassium within normal range, check magnesium level.  Avoid QT prolonging drugs. 09/22/2022: Magnesium was 1.3.  IV magnesium sulfate 4 g x 1 dose ordered.  Check magnesium level in the morning.  Check electrolytes in the morning.  EKG in the morning.  QTc interval has improved to 513 ms.     Lupus Hyperlipidemia Hypertension GERD Gout   DVT prophylaxis: SCDs Code Status: Full code Family Communication: Cousin Disposition Plan: This will depend on hospital course.   Consultants:  Surgery  Procedures:  NG tube to low suction  Antimicrobials:  None   Subjective: -Admitted with SBO. -Abdominal pain has resolved.  Objective: Vitals:   09/22/22 0900 09/22/22 1200 09/22/22 1214 09/22/22 1253  BP: (!) 173/80 (!) 169/75  (!) 176/81  Pulse: 88 91  (!) 101  Resp: (!) 9 17  (!) 21  Temp:   98.3 F (36.8 C) 98.3 F (36.8 C)  TempSrc:   Oral Oral  SpO2: 97% 99%  99%  Weight:      Height:       No intake or output data in the 24 hours ending 09/22/22 1517 Filed Weights   09/22/22 0519  Weight: 63.5  kg    Examination:  General exam: Appears calm and comfortable.  Patient has NG tube to low suction. Respiratory system: Clear to auscultation.  Cardiovascular system: S1 & S2 heard Gastrointestinal system: Abdomen is soft and nontender.   Central nervous system: Alert and oriented.  Extremities: No leg edema.  Data Reviewed: I have personally reviewed following labs and imaging  studies  CBC: Recent Labs  Lab 09/22/22 0004  WBC 9.7  HGB 11.3*  HCT 36.6  MCV 88.8  PLT 325   Basic Metabolic Panel: Recent Labs  Lab 09/22/22 0004 09/22/22 0442  NA 141 137  K 4.4 3.8  CL 103 105  CO2 18* 21*  GLUCOSE 269* 198*  BUN 41* 36*  CREATININE 3.04* 2.62*  CALCIUM 9.9 9.1  MG  --  1.3*   GFR: Estimated Creatinine Clearance: 17.4 mL/min (A) (by C-G formula based on SCr of 2.62 mg/dL (H)). Liver Function Tests: Recent Labs  Lab 09/22/22 0004  AST 19  ALT 14  ALKPHOS 85  BILITOT 0.7  PROT 7.9  ALBUMIN 3.5   Recent Labs  Lab 09/22/22 0004  LIPASE 39   No results for input(s): "AMMONIA" in the last 168 hours. Coagulation Profile: No results for input(s): "INR", "PROTIME" in the last 168 hours. Cardiac Enzymes: No results for input(s): "CKTOTAL", "CKMB", "CKMBINDEX", "TROPONINI" in the last 168 hours. BNP (last 3 results) No results for input(s): "PROBNP" in the last 8760 hours. HbA1C: Recent Labs    09/22/22 0442  HGBA1C 5.3   CBG: Recent Labs  Lab 09/22/22 0427 09/22/22 0744 09/22/22 1248  GLUCAP 187* 115* 107*   Lipid Profile: No results for input(s): "CHOL", "HDL", "LDLCALC", "TRIG", "CHOLHDL", "LDLDIRECT" in the last 72 hours. Thyroid Function Tests: No results for input(s): "TSH", "T4TOTAL", "FREET4", "T3FREE", "THYROIDAB" in the last 72 hours. Anemia Panel: No results for input(s): "VITAMINB12", "FOLATE", "FERRITIN", "TIBC", "IRON", "RETICCTPCT" in the last 72 hours. Urine analysis:    Component Value Date/Time   COLORURINE YELLOW 07/28/2022 1618   APPEARANCEUR HAZY (A) 07/28/2022 1618   APPEARANCEUR Clear 08/22/2018 1620   LABSPEC 1.013 07/28/2022 1618   LABSPEC 1.020 03/03/2014 1341   PHURINE 5.0 07/28/2022 1618   GLUCOSEU NEGATIVE 07/28/2022 1618   GLUCOSEU Negative 03/03/2014 1341   HGBUR NEGATIVE 07/28/2022 1618   BILIRUBINUR NEGATIVE 07/28/2022 1618   BILIRUBINUR Negative 08/22/2018 1620   BILIRUBINUR Negative  03/03/2014 1341   KETONESUR NEGATIVE 07/28/2022 1618   PROTEINUR 100 (A) 07/28/2022 1618   UROBILINOGEN 0.2 03/03/2014 1341   NITRITE NEGATIVE 07/28/2022 1618   LEUKOCYTESUR TRACE (A) 07/28/2022 1618   LEUKOCYTESUR Negative 03/03/2014 1341   Sepsis Labs: @LABRCNTIP (procalcitonin:4,lacticidven:4)  )No results found for this or any previous visit (from the past 240 hour(s)).       Radiology Studies: DG Abd Portable 1V-Small Bowel Protocol-Position Verification  Result Date: 09/22/2022 CLINICAL DATA:  Confirm nasogastric tube placement EXAM: PORTABLE ABDOMEN - 1 VIEW COMPARISON:  Earlier today FINDINGS: Enteric tube tip and side-port overlaps the stomach. Normal bowel gas pattern. Over penetrated lung parenchyma on the view over the chest. Normal heart size. IMPRESSION: Enteric tube with tip and side port at the stomach. Electronically Signed   By: Tiburcio Pea M.D.   On: 09/22/2022 08:06   DG Abd Portable 1 View  Result Date: 09/22/2022 CLINICAL DATA:  NG tube placement EXAM: PORTABLE ABDOMEN - 1 VIEW COMPARISON:  11/30/2020 FINDINGS: NG tube tip is in the stomach with the side port near the GE  junction. IMPRESSION: NG tube tip in the stomach. Electronically Signed   By: Charlett Nose M.D.   On: 09/22/2022 03:56   CT ABDOMEN PELVIS WO CONTRAST  Addendum Date: 09/22/2022   ADDENDUM REPORT: 09/22/2022 01:37 ADDENDUM: Critical Value/emergent results were called by telephone at the time of interpretation on 09/22/2022 at 1:37 am to provider DAN FLOYD , who verbally acknowledged these results. Electronically Signed   By: Thornell Sartorius M.D.   On: 09/22/2022 01:37   Result Date: 09/22/2022 CLINICAL DATA:  Abdominal pain, acute, nonlocalized. EXAM: CT ABDOMEN AND PELVIS WITHOUT CONTRAST TECHNIQUE: Multidetector CT imaging of the abdomen and pelvis was performed following the standard protocol without IV contrast. RADIATION DOSE REDUCTION: This exam was performed according to the departmental  dose-optimization program which includes automated exposure control, adjustment of the mA and/or kV according to patient size and/or use of iterative reconstruction technique. COMPARISON:  07/28/2022. FINDINGS: Lower chest: Multi-vessel coronary artery calcifications are noted. There is a trace pericardial effusion. No acute abnormality. Hepatobiliary: No focal liver abnormality is seen. Status post cholecystectomy. No biliary dilatation. Pancreas: Unremarkable. No pancreatic ductal dilatation or surrounding inflammatory changes. Spleen: Normal in size without focal abnormality. Adrenals/Urinary Tract: The adrenal glands are within normal limits. A cyst is noted in the mid right kidney. Additional hypodensities are present in the right kidney which are too small to further characterize. No renal calculus or hydronephrosis bilaterally. The bladder is unremarkable. Stomach/Bowel: There is a small hiatal hernia. The stomach is within normal limits. There are mildly distended loops of small bowel in the abdomen with air-fluid levels measuring up to 4.2 cm in diameter in the right lower quadrant. There is distended loop of small bowel with fecalization in the pelvis with surrounding fat stranding with possible transition point in the pelvis in the left lower quadrant,, best seen on coronal image 40, with decompression of the distal ileum. Partial colectomy changes are noted on the right with anastomotic site in the right lower quadrant. A few scattered diverticula are present along the colon without evidence of diverticulitis. Vascular/Lymphatic: Aortic atherosclerosis. No enlarged abdominal or pelvic lymph nodes. Reproductive: Status post hysterectomy. No adnexal masses. Other: No abdominopelvic ascites. Musculoskeletal: There is soft tissue thickening in the anterior right breast with surgical clips in place. Degenerative changes are present in the thoracolumbar spine. Diffuse increased density is noted in the bones  which may be associated with metabolic disease such as renal osteodystrophy. A sclerotic lesion is noted in the femoral head on the left, possible avascular necrosis. No acute osseous abnormality. IMPRESSION: 1. Distended loops of small bowel in the abdomen measuring up to 4.2 cm with fecalization of the small bowel in the pelvis and possible transition point in the left lower quadrant, concerning for small bowel obstruction. Surgical consultation is recommended. 2. Multi-vessel coronary artery calcifications. 3. Aortic atherosclerosis. 4. Remaining incidental findings as described above. Electronically Signed: By: Thornell Sartorius M.D. On: 09/22/2022 01:34        Scheduled Meds:  insulin aspart  0-9 Units Subcutaneous Q4H   Continuous Infusions:  sodium chloride 75 mL/hr at 09/22/22 1339   magnesium sulfate bolus IVPB       LOS: 0 days    Time spent: 35 minutes.    Berton Mount, MD  [facility] Pager #: [phone] 7PM-7AM contact night coverage as above

## 2022-09-22 NOTE — ED Notes (Signed)
Pt given contrast at 1025 via ng tube. Radiology aware.

## 2022-09-22 NOTE — Consult Note (Signed)
Consulting Physician: Hyman Hopes Matthew Pais  Referring Provider: Dr. Adela Lank - ER provider  Chief Complaint: Abdominal pain  Reason for Consult: Bowel obstruction   Subjective   HPI: [name] is an 77 y.o. female who is here for abdominal pain, nausea, vomiting.  The pain started on the 4th of July and was accompanied by bloating. She was vomiting at home.  Her last bowel movement was 2 days ago.  She had previous ileocecectomy for bowel obstruction on 12/09/2001 with Dr. Orson Slick, multiple other abdominal surgeries, and previous admissions for bowel obstructions, 2019, 2018, 2015. Obstruction usually looks to be distal on previous images.  Past Medical History:  Diagnosis Date   Abdominal discomfort    Chronic N/V/D. Presumptive dx Crohn's dx per elevated p ANCA. Failed Entocort and Pentasa. Sep 2003 - ileocolectomy c anastomosis per Dr Orson Slick 2/2 adhesions - path was hegative for Crohns. EGD, Sm bowel follow through (11/03), and an eteroclysis (10/03) were unrevealing. Cuases hypomag and hypocalcemia.   Adnexal mass 8/03   s/p lap BSO (R ovarian fibroma) & lysis of adhesions   Allergy    Seasonal   Anemia    Multifactorial. Baseline HgB 10-11 ish. B12 def - 150 in 3/10. Fe Def - ferritin 35 3/10. Both are being repleted.   Breast cancer (HCC) 03/16/13   right, 5 o'clock   CAD (coronary artery disease) 1996   1996 - PTCA and angioplasty diagonal branch. 2000 - Rotoblator & angiopllasty of diagonal. 2006 - subendocardial AMI, DES to proximal LAD.Marland Kitchen Also had 90% stenosis in distal apical LAD. EF 55 with apical hypokinesis. Indefinite ASA and Plavix.   CHF (congestive heart failure) (HCC)    Chronic kidney disease    Chronic renal insuff baseline Cr 1.2 - 1.4 ish.   Chronic kidney disease, stage III (moderate) (HCC)    Chronic renal insuff baseline Cr 1.2 - 1.4 ish. New baseline about 2 (2018)   Chronic pain    CT 10/10 = Spinal stenosis L2 - S1.   COVID-19 11/02/2020    Diabetes mellitus    Insulin dependent   Diabetes mellitus type 2 in obese 05/03/2010   Managed on lantus and novolog. Has diabetic nephropathy. Metformin D/C'd 2012 2/2 creatinine.  No diabetic retinopathy per 3/11.    Gout    Hx of radiation therapy 06/02/13- 07/16/13   right rbeast 4500 cGy 25 sessions, right breast boost 1600 cGy in 8 sessions   Hyperlipidemia    Managed with both a statin and Welchol. Welchol stopped 2014 2/2 cost and started on fenofibrate    Hypertension    2006 B renal arteries patent. 2003 MRA - no RAS. 2003 pheo W/U Dr Caryn Section reportedly negative.   Hypoxia 07/23/2017   Lupus (HCC)    Lymphedema of breast    Personal history of radiation therapy 2015   Pneumonia of left lung due to infectious organism    RBBB    Renal vein thrombosis (HCC)    SBO (small bowel obstruction) (HCC) 09/17/2017   Secondary hyperparathyroidism (HCC)    Stroke (HCC)    Incidental finding MRI 2002 L lacunar infarct   Vitamin B12 deficiency    Vitamin D deficiency    Wears dentures    top    Past Surgical History:  Procedure Laterality Date   ABDOMINAL HYSTERECTOMY     BILATERAL SALPINGOOPHORECTOMY  8/03   Lap BSO (R ovarian fibroma) and adhesion lysis   BIOPSY  09/08/2020   Procedure: BIOPSY;  Surgeon: Bernette Redbird, MD;  Location: [facility];  Service: Endoscopy;;   BOWEL RESECTION  2003   ileocolectomy with anastomosis 2/2 adhesions   BREAST BIOPSY Right 2015   BREAST BIOPSY Right 07/2014   BREAST LUMPECTOMY Right 04/22/2013   BREAST LUMPECTOMY WITH NEEDLE LOCALIZATION AND AXILLARY SENTINEL LYMPH NODE BX Right 04/22/2013   Procedure: BREAST LUMPECTOMY WITH NEEDLE LOCALIZATION AND AXILLARY SENTINEL LYMPH NODE BX;  Surgeon: Almond Lint, MD;  Location: [facility];  Service: General;  Laterality: Right;   CARDIAC CATHETERIZATION     2 stents   CHOLECYSTECTOMY     COLONOSCOPY     ESOPHAGOGASTRODUODENOSCOPY N/A 09/08/2020   Procedure: ESOPHAGOGASTRODUODENOSCOPY  (EGD);  Surgeon: Bernette Redbird, MD;  Location: [facility];  Service: Endoscopy;  Laterality: N/A;   HEMICOLECTOMY     R sided hemicolectomy   HERNIA REPAIR     Ventral hernia repair   PTCA  4/06    Family History  Problem Relation Age of Onset   Pancreatic cancer Brother 84   Breast cancer Mother 80   Lung cancer Maternal Aunt    Breast cancer Maternal Aunt    Prostate cancer Maternal Uncle    Heart attack Maternal Grandmother    Breast cancer Maternal Grandmother    Colon cancer Maternal Grandmother    Clotting disorder Maternal Grandmother    Diabetes Maternal Grandmother    Kidney failure Maternal Aunt    Hypertension Daughter     Social:  reports that she has never smoked. She has never used smokeless tobacco. She reports that she does not drink alcohol and does not use drugs.  Allergies:  Allergies  Allergen Reactions   Anesthetics, Halogenated Other (See Comments)    Per patient's daughter, she can not tolerate anesthetics.    Etomidate Other (See Comments)    Heart stopped during anesthesia   Fentanyl Other (See Comments)    Heart stopped during anesthesia    Percocet [Oxycodone-Acetaminophen] Other (See Comments)    Headaches    Diazepam Other (See Comments)    Agitation   Lorazepam Nausea Only   Tramadol Hcl Nausea And Vomiting   Haloperidol Nausea And Vomiting    **Haldol**   Morphine Sulfate Nausea Only    agitation   Propoxyphene Nausea And Vomiting    **Darvocet** no longer available   Versed [Midazolam] Other (See Comments)    Heart stopped during anesthesia    Medications: Current Outpatient Medications  Medication Instructions   acetaminophen (TYLENOL) 500 mg, Oral, Every 6 hours PRN   amLODipine (NORVASC) 10 mg, Oral, Daily   budesonide (ENTOCORT EC) 3 MG 24 hr capsule Take 1 capsule by mouth once daily   colestipol (COLESTID) 2 g, Oral, Daily at bedtime   CREON 36000-114000 units CPEP capsule 36,000 Units, Oral, 3 times daily with  meals   Eliquis 2.5 mg, Oral, 2 times daily   famotidine (PEPCID) 20 mg, Oral, Daily PRN   FLUoxetine (PROZAC) 20 mg, Oral, Daily at bedtime   furosemide (LASIX) 40 mg, Oral, Daily   hydrALAZINE (APRESOLINE) 50 mg, Oral, 2 times daily   loperamide (IMODIUM) 4 mg, Oral, Every 6 hours PRN   mirtazapine (REMERON) 7.5 mg, Oral, Daily at bedtime   nitroGLYCERIN (NITROSTAT) 0.4 MG SL tablet DISSOLVE 1 TABLET UNDER THE TONGUE EVERY 5 MINUTES AS  NEEDED FOR CHEST PAIN. MAX  OF 3 TABLETS IN 15 MINUTES. CALL 911 IF PAIN PERSISTS.   pantoprazole (PROTONIX) 40 mg, Oral, 2 times daily  tiZANidine (ZANAFLEX) 4 mg, Oral, 3 times daily    ROS - all of the below systems have been reviewed with the patient and positives are indicated with bold text General: chills, fever or night sweats Eyes: blurry vision or double vision ENT: epistaxis or sore throat Allergy/Immunology: itchy/watery eyes or nasal congestion Hematologic/Lymphatic: bleeding problems, blood clots or swollen lymph nodes Endocrine: temperature intolerance or unexpected weight changes Breast: new or changing breast lumps or nipple discharge Resp: cough, shortness of breath, or wheezing CV: chest pain or dyspnea on exertion GI: as per HPI GU: dysuria, trouble voiding, or hematuria MSK: joint pain or joint stiffness Neuro: TIA or stroke symptoms Derm: pruritus and skin lesion changes Psych: anxiety and depression  Objective   PE Blood pressure (!) 165/75, pulse 85, temperature 98.2 F (36.8 C), temperature source Oral, resp. rate 18, height 5\' 6"  (1.676 m), weight 63.5 kg, SpO2 97 %. Constitutional: NAD; conversant; no deformities Eyes: Moist conjunctiva; no lid lag; anicteric; PERRL Neck: Trachea midline; no thyromegaly Lungs: Normal respiratory effort; no tactile fremitus CV: RRR; no palpable thrills; no pitting edema GI: Abd Distention, tender; no palpable hepatosplenomegaly MSK: Normal range of motion of extremities; no  clubbing/cyanosis Psychiatric: Appropriate affect; alert and oriented x3 Lymphatic: No palpable cervical or axillary lymphadenopathy  Results for orders placed or performed during the hospital encounter of 09/21/22 (from the past 24 hour(s))  Lipase, blood     Status: None   Collection Time: 09/22/22 12:04 AM  Result Value Ref Range   Lipase 39 11 - 51 U/L  Comprehensive metabolic panel     Status: Abnormal   Collection Time: 09/22/22 12:04 AM  Result Value Ref Range   Sodium 141 135 - 145 mmol/L   Potassium 4.4 3.5 - 5.1 mmol/L   Chloride 103 98 - 111 mmol/L   CO2 18 (L) 22 - 32 mmol/L   Glucose, Bld 269 (H) 70 - 99 mg/dL   BUN 41 (H) 8 - 23 mg/dL   Creatinine, Ser 6.96 (H) 0.44 - 1.00 mg/dL   Calcium 9.9 8.9 - 29.5 mg/dL   Total Protein 7.9 6.5 - 8.1 g/dL   Albumin 3.5 3.5 - 5.0 g/dL   AST 19 15 - 41 U/L   ALT 14 0 - 44 U/L   Alkaline Phosphatase 85 38 - 126 U/L   Total Bilirubin 0.7 0.3 - 1.2 mg/dL   GFR, Estimated 15 (L) >60 mL/min   Anion gap 20 (H) 5 - 15  CBC     Status: Abnormal   Collection Time: 09/22/22 12:04 AM  Result Value Ref Range   WBC 9.7 4.0 - 10.5 K/uL   RBC 4.12 3.87 - 5.11 MIL/uL   Hemoglobin 11.3 (L) 12.0 - 15.0 g/dL   HCT 28.4 13.2 - 44.0 %   MCV 88.8 80.0 - 100.0 fL   MCH 27.4 26.0 - 34.0 pg   MCHC 30.9 30.0 - 36.0 g/dL   RDW 10.2 (H) 72.5 - 36.6 %   Platelets 325 150 - 400 K/uL   nRBC 0.0 0.0 - 0.2 %  CBG monitoring, ED     Status: Abnormal   Collection Time: 09/22/22  4:27 AM  Result Value Ref Range   Glucose-Capillary 187 (H) 70 - 99 mg/dL  Basic metabolic panel     Status: Abnormal   Collection Time: 09/22/22  4:42 AM  Result Value Ref Range   Sodium 137 135 - 145 mmol/L   Potassium 3.8 3.5 - 5.1  mmol/L   Chloride 105 98 - 111 mmol/L   CO2 21 (L) 22 - 32 mmol/L   Glucose, Bld 198 (H) 70 - 99 mg/dL   BUN 36 (H) 8 - 23 mg/dL   Creatinine, Ser 4.78 (H) 0.44 - 1.00 mg/dL   Calcium 9.1 8.9 - 29.5 mg/dL   GFR, Estimated 18 (L) >60 mL/min    Anion gap 11 5 - 15  Hemoglobin A1c     Status: None   Collection Time: 09/22/22  4:42 AM  Result Value Ref Range   Hgb A1c MFr Bld 5.3 4.8 - 5.6 %   Mean Plasma Glucose 105.41 mg/dL  Magnesium     Status: Abnormal   Collection Time: 09/22/22  4:42 AM  Result Value Ref Range   Magnesium 1.3 (L) 1.7 - 2.4 mg/dL  Brain natriuretic peptide     Status: None   Collection Time: 09/22/22  4:42 AM  Result Value Ref Range   B Natriuretic Peptide 80.0 0.0 - 100.0 pg/mL   *Note: Due to a large number of results and/or encounters for the requested time period, some results have not been displayed. A complete set of results can be found in Results Review.     Imaging Orders         CT ABDOMEN PELVIS WO CONTRAST         DG Abd Portable 1 View     1. Distended loops of small bowel in the abdomen measuring up to 4.2 cm with fecalization of the small bowel in the pelvis and possible transition point in the left lower quadrant, concerning for small bowel obstruction. Surgical consultation is recommended. 2. Multi-vessel coronary artery calcifications. 3. Aortic atherosclerosis. 4. Remaining incidental findings as described above.  Assessment and Plan   [name] is an 77 y.o. female with a small bowel obstruction.  I recommend completing the small bowel obstruction protocol.  Contrast may help resolve the obstruction.  If it does not pass through, we may need to consider surgery.  Surgery team will follow closely.      ICD-10-CM   1. SBO (small bowel obstruction) (HCC)  K56.609        Quentin Ore, MD  [facility] Use [email or web address] to contact on call provider  New Patient Billing: [id number] - High MDM

## 2022-09-23 ENCOUNTER — Inpatient Hospital Stay (HOSPITAL_COMMUNITY): Payer: Medicare HMO

## 2022-09-23 DIAGNOSIS — K56609 Unspecified intestinal obstruction, unspecified as to partial versus complete obstruction: Secondary | ICD-10-CM | POA: Diagnosis not present

## 2022-09-23 LAB — RENAL FUNCTION PANEL
Albumin: 2.7 g/dL — ABNORMAL LOW (ref 3.5–5.0)
Anion gap: 10 (ref 5–15)
BUN: 31 mg/dL — ABNORMAL HIGH (ref 8–23)
CO2: 22 mmol/L (ref 22–32)
Calcium: 8.8 mg/dL — ABNORMAL LOW (ref 8.9–10.3)
Chloride: 112 mmol/L — ABNORMAL HIGH (ref 98–111)
Creatinine, Ser: 2.16 mg/dL — ABNORMAL HIGH (ref 0.44–1.00)
GFR, Estimated: 23 mL/min — ABNORMAL LOW (ref >60)
Glucose, Bld: 102 mg/dL — ABNORMAL HIGH (ref 70–99)
Phosphorus: 3.3 mg/dL (ref 2.5–4.6)
Potassium: 3.6 mmol/L (ref 3.5–5.1)
Sodium: 144 mmol/L (ref 135–145)

## 2022-09-23 LAB — GLUCOSE, CAPILLARY
Glucose-Capillary: 141 mg/dL — ABNORMAL HIGH (ref 70–99)
Glucose-Capillary: 144 mg/dL — ABNORMAL HIGH (ref 70–99)
Glucose-Capillary: 76 mg/dL (ref 70–99)
Glucose-Capillary: 78 mg/dL (ref 70–99)
Glucose-Capillary: 85 mg/dL (ref 70–99)
Glucose-Capillary: 86 mg/dL (ref 70–99)
Glucose-Capillary: 99 mg/dL (ref 70–99)

## 2022-09-23 LAB — CBC WITH DIFFERENTIAL/PLATELET
Abs Immature Granulocytes: 0.06 10*3/uL (ref 0.00–0.07)
Basophils Absolute: 0 10*3/uL (ref 0.0–0.1)
Basophils Relative: 0 %
Eosinophils Absolute: 0 10*3/uL (ref 0.0–0.5)
Eosinophils Relative: 0 %
HCT: 32.8 % — ABNORMAL LOW (ref 36.0–46.0)
Hemoglobin: 10 g/dL — ABNORMAL LOW (ref 12.0–15.0)
Immature Granulocytes: 1 %
Lymphocytes Relative: 8 %
Lymphs Abs: 0.9 10*3/uL (ref 0.7–4.0)
MCH: 26.8 pg (ref 26.0–34.0)
MCHC: 30.5 g/dL (ref 30.0–36.0)
MCV: 87.9 fL (ref 80.0–100.0)
Monocytes Absolute: 0.6 10*3/uL (ref 0.1–1.0)
Monocytes Relative: 6 %
Neutro Abs: 8.6 10*3/uL — ABNORMAL HIGH (ref 1.7–7.7)
Neutrophils Relative %: 85 %
Platelets: 284 10*3/uL (ref 150–400)
RBC: 3.73 MIL/uL — ABNORMAL LOW (ref 3.87–5.11)
RDW: 17.2 % — ABNORMAL HIGH (ref 11.5–15.5)
WBC: 10.2 10*3/uL (ref 4.0–10.5)
nRBC: 0 % (ref 0.0–0.2)

## 2022-09-23 LAB — URINALYSIS, ROUTINE W REFLEX MICROSCOPIC
Bilirubin Urine: NEGATIVE
Glucose, UA: NEGATIVE mg/dL
Hgb urine dipstick: NEGATIVE
Ketones, ur: NEGATIVE mg/dL
Leukocytes,Ua: NEGATIVE
Nitrite: NEGATIVE
Protein, ur: 100 mg/dL — AB
Specific Gravity, Urine: 1.014 (ref 1.005–1.030)
pH: 5 (ref 5.0–8.0)

## 2022-09-23 LAB — MAGNESIUM: Magnesium: 2.7 mg/dL — ABNORMAL HIGH (ref 1.7–2.4)

## 2022-09-23 MED ORDER — PANTOPRAZOLE SODIUM 40 MG PO TBEC
40.0000 mg | DELAYED_RELEASE_TABLET | Freq: Two times a day (BID) | ORAL | Status: DC
  Administered 2022-09-23 – 2022-09-25 (×5): 40 mg via ORAL
  Filled 2022-09-23 (×5): qty 1

## 2022-09-23 MED ORDER — POLYETHYLENE GLYCOL 3350 17 G PO PACK
17.0000 g | PACK | Freq: Every day | ORAL | Status: DC
  Administered 2022-09-23 – 2022-09-24 (×2): 17 g via ORAL
  Filled 2022-09-23 (×2): qty 1

## 2022-09-23 MED ORDER — HEPARIN SODIUM (PORCINE) 5000 UNIT/ML IJ SOLN
5000.0000 [IU] | Freq: Two times a day (BID) | INTRAMUSCULAR | Status: DC
  Administered 2022-09-23 – 2022-09-25 (×5): 5000 [IU] via SUBCUTANEOUS
  Filled 2022-09-23 (×5): qty 1

## 2022-09-23 MED ORDER — AMLODIPINE BESYLATE 10 MG PO TABS
10.0000 mg | ORAL_TABLET | Freq: Every day | ORAL | Status: DC
  Administered 2022-09-23 – 2022-09-25 (×3): 10 mg via ORAL
  Filled 2022-09-23 (×3): qty 1

## 2022-09-23 MED ORDER — HYDRALAZINE HCL 20 MG/ML IJ SOLN
10.0000 mg | INTRAMUSCULAR | Status: DC | PRN
  Administered 2022-09-23: 10 mg via INTRAVENOUS
  Filled 2022-09-23: qty 1

## 2022-09-23 MED ORDER — HYDRALAZINE HCL 50 MG PO TABS
50.0000 mg | ORAL_TABLET | Freq: Two times a day (BID) | ORAL | Status: DC
  Administered 2022-09-23 – 2022-09-25 (×5): 50 mg via ORAL
  Filled 2022-09-23 (×5): qty 1

## 2022-09-23 MED ORDER — BISACODYL 10 MG RE SUPP: 10.0000 mg | Freq: Every day | RECTAL | Status: DC | PRN

## 2022-09-23 MED ORDER — FAMOTIDINE 20 MG PO TABS: 20.0000 mg | ORAL_TABLET | Freq: Every day | ORAL | Status: DC | PRN

## 2022-09-23 MED ORDER — BUDESONIDE 3 MG PO CPEP
3.0000 mg | ORAL_CAPSULE | Freq: Three times a day (TID) | ORAL | Status: DC
  Administered 2022-09-23 – 2022-09-25 (×7): 3 mg via ORAL
  Filled 2022-09-23 (×9): qty 1

## 2022-09-23 MED ORDER — MENTHOL 3 MG MT LOZG
1.0000 | LOZENGE | OROMUCOSAL | Status: DC | PRN
  Administered 2022-09-23: 3 mg via ORAL
  Filled 2022-09-23: qty 9

## 2022-09-23 MED ORDER — DOCUSATE SODIUM 100 MG PO CAPS
100.0000 mg | ORAL_CAPSULE | Freq: Two times a day (BID) | ORAL | Status: DC
  Administered 2022-09-23 – 2022-09-24 (×4): 100 mg via ORAL
  Filled 2022-09-23 (×4): qty 1

## 2022-09-23 MED ORDER — PHENOL 1.4 % MT LIQD
1.0000 | OROMUCOSAL | Status: DC | PRN
  Administered 2022-09-23: 1 via OROMUCOSAL
  Filled 2022-09-23: qty 177

## 2022-09-23 NOTE — Progress Notes (Signed)
Progress Note     Subjective: Pt reports some generalized abdominal soreness but overall feels a little better. Passing flatus, but no BM yet.   Objective: Vital signs in last 24 hours: Temp:  [97.8 F (36.6 C)-98.5 F (36.9 C)] 97.8 F (36.6 C) (07/07 0732) Pulse Rate:  [72-101] 92 (07/07 0732) Resp:  [18-21] 20 (07/07 0732) BP: (160-180)/(70-81) 176/70 (07/07 0732) SpO2:  [95 %-99 %] 98 % (07/07 0732) Last BM Date : 09/21/22  Intake/Output from previous day: 07/06 0701 - 07/07 0700 In: 1647.9 [I.V.:1557.7; NG/GT:60; IV Piggyback:30.2] Out: 600 [Urine:200; Emesis/NG output:400] Intake/Output this shift: Total I/O In: 30 [NG/GT:30] Out: 300 [Urine:300]  PE: General: pleasant, WD, WN female who is laying in bed in NAD Heart: regular, rate, and rhythm. Lungs:Respiratory effort nonlabored Abd:  soft, mild generalized ttp without peritonitis, mild to mod distention, BS hypoactive, NGT with bilious drainage, multiple surgical scars      Lab Results:  Recent Labs    09/22/22 0004 09/23/22 0219  WBC 9.7 10.2  HGB 11.3* 10.0*  HCT 36.6 32.8*  PLT 325 284   BMET Recent Labs    09/22/22 0442 09/23/22 0219  NA 137 144  K 3.8 3.6  CL 105 112*  CO2 21* 22  GLUCOSE 198* 102*  BUN 36* 31*  CREATININE 2.62* 2.16*  CALCIUM 9.1 8.8*   PT/INR No results for input(s): "LABPROT", "INR" in the last 72 hours. CMP     Component Value Date/Time   NA 144 09/23/2022 0219   NA 144 07/19/2020 1458   NA 140 03/14/2017 0810   K 3.6 09/23/2022 0219   K 3.8 03/14/2017 0810   CL 112 (H) 09/23/2022 0219   CO2 22 09/23/2022 0219   CO2 27 03/14/2017 0810   GLUCOSE 102 (H) 09/23/2022 0219   GLUCOSE 177 (H) 03/14/2017 0810   BUN 31 (H) 09/23/2022 0219   BUN 85 (HH) 07/19/2020 1458   BUN 35.6 (H) 03/14/2017 0810   CREATININE 2.16 (H) 09/23/2022 0219   CREATININE 3.30 (H) 05/24/2022 1405   CREATININE 1.8 (H) 03/14/2017 0810   CALCIUM 8.8 (L) 09/23/2022 0219   CALCIUM 9.6  03/14/2017 0810   PROT 7.9 09/22/2022 0004   PROT 7.8 03/14/2017 0810   ALBUMIN 2.7 (L) 09/23/2022 0219   ALBUMIN 3.5 03/14/2017 0810   AST 19 09/22/2022 0004   AST 16 05/24/2022 1405   AST 17 03/14/2017 0810   ALT 14 09/22/2022 0004   ALT 16 05/24/2022 1405   ALT 17 03/14/2017 0810   ALKPHOS 85 09/22/2022 0004   ALKPHOS 61 03/14/2017 0810   BILITOT 0.7 09/22/2022 0004   BILITOT 0.3 05/24/2022 1405   BILITOT 0.28 03/14/2017 0810   GFRNONAA 23 (L) 09/23/2022 0219   GFRNONAA 14 (L) 05/24/2022 1405   GFRNONAA 45 (L) 12/17/2013 1158   GFRAA 21 (L) 12/21/2019 0845   GFRAA 52 (L) 12/17/2013 1158   Lipase     Component Value Date/Time   LIPASE 39 09/22/2022 0004       Studies/Results: DG Abd Portable 1V-Small Bowel Obstruction Protocol-initial, 8 hr delay  Result Date: 09/22/2022 CLINICAL DATA:  Small bowel obstruction EXAM: PORTABLE ABDOMEN - 1 VIEW COMPARISON:  09/22/2022, 7:34 a.m. FINDINGS: Esophagogastric tube with tip and side port below the diaphragm. Enteric contrast within the gastric fundus. Suspect that there has been additional oral enteric contrast administered, which is diluted throughout the small bowel and colon. General paucity of small bowel gas without obvious bowel  distention. Scattered stool and most likely admixed contrast throughout the colon, present to the distal colon, with scattered gas present to at least the sigmoid. No free air. IMPRESSION: 1. Esophagogastric tube with tip and side port below the diaphragm. Enteric contrast within the gastric fundus. Suspect that there has been additional oral enteric contrast administered, which is diluted throughout the small bowel and colon. 2. General paucity of small bowel gas without obvious bowel distention. Scattered stool and most likely admixed contrast throughout the colon, present to the distal colon, with scattered gas present to at least the sigmoid. Electronically Signed   By: Jearld Lesch M.D.   On: 09/22/2022  18:37   DG Abd Portable 1V-Small Bowel Protocol-Position Verification  Result Date: 09/22/2022 CLINICAL DATA:  Confirm nasogastric tube placement EXAM: PORTABLE ABDOMEN - 1 VIEW COMPARISON:  Earlier today FINDINGS: Enteric tube tip and side-port overlaps the stomach. Normal bowel gas pattern. Over penetrated lung parenchyma on the view over the chest. Normal heart size. IMPRESSION: Enteric tube with tip and side port at the stomach. Electronically Signed   By: Tiburcio Pea M.D.   On: 09/22/2022 08:06   DG Abd Portable 1 View  Result Date: 09/22/2022 CLINICAL DATA:  NG tube placement EXAM: PORTABLE ABDOMEN - 1 VIEW COMPARISON:  11/30/2020 FINDINGS: NG tube tip is in the stomach with the side port near the GE junction. IMPRESSION: NG tube tip in the stomach. Electronically Signed   By: Charlett Nose M.D.   On: 09/22/2022 03:56   CT ABDOMEN PELVIS WO CONTRAST  Addendum Date: 09/22/2022   ADDENDUM REPORT: 09/22/2022 01:37 ADDENDUM: Critical Value/emergent results were called by telephone at the time of interpretation on 09/22/2022 at 1:37 am to provider DAN FLOYD , who verbally acknowledged these results. Electronically Signed   By: Thornell Sartorius M.D.   On: 09/22/2022 01:37   Result Date: 09/22/2022 CLINICAL DATA:  Abdominal pain, acute, nonlocalized. EXAM: CT ABDOMEN AND PELVIS WITHOUT CONTRAST TECHNIQUE: Multidetector CT imaging of the abdomen and pelvis was performed following the standard protocol without IV contrast. RADIATION DOSE REDUCTION: This exam was performed according to the departmental dose-optimization program which includes automated exposure control, adjustment of the mA and/or kV according to patient size and/or use of iterative reconstruction technique. COMPARISON:  07/28/2022. FINDINGS: Lower chest: Multi-vessel coronary artery calcifications are noted. There is a trace pericardial effusion. No acute abnormality. Hepatobiliary: No focal liver abnormality is seen. Status post  cholecystectomy. No biliary dilatation. Pancreas: Unremarkable. No pancreatic ductal dilatation or surrounding inflammatory changes. Spleen: Normal in size without focal abnormality. Adrenals/Urinary Tract: The adrenal glands are within normal limits. A cyst is noted in the mid right kidney. Additional hypodensities are present in the right kidney which are too small to further characterize. No renal calculus or hydronephrosis bilaterally. The bladder is unremarkable. Stomach/Bowel: There is a small hiatal hernia. The stomach is within normal limits. There are mildly distended loops of small bowel in the abdomen with air-fluid levels measuring up to 4.2 cm in diameter in the right lower quadrant. There is distended loop of small bowel with fecalization in the pelvis with surrounding fat stranding with possible transition point in the pelvis in the left lower quadrant,, best seen on coronal image 40, with decompression of the distal ileum. Partial colectomy changes are noted on the right with anastomotic site in the right lower quadrant. A few scattered diverticula are present along the colon without evidence of diverticulitis. Vascular/Lymphatic: Aortic atherosclerosis. No enlarged abdominal or pelvic  lymph nodes. Reproductive: Status post hysterectomy. No adnexal masses. Other: No abdominopelvic ascites. Musculoskeletal: There is soft tissue thickening in the anterior right breast with surgical clips in place. Degenerative changes are present in the thoracolumbar spine. Diffuse increased density is noted in the bones which may be associated with metabolic disease such as renal osteodystrophy. A sclerotic lesion is noted in the femoral head on the left, possible avascular necrosis. No acute osseous abnormality. IMPRESSION: 1. Distended loops of small bowel in the abdomen measuring up to 4.2 cm with fecalization of the small bowel in the pelvis and possible transition point in the left lower quadrant, concerning for  small bowel obstruction. Surgical consultation is recommended. 2. Multi-vessel coronary artery calcifications. 3. Aortic atherosclerosis. 4. Remaining incidental findings as described above. Electronically Signed: By: Thornell Sartorius M.D. On: 09/22/2022 01:34    Anti-infectives: Anti-infectives (From admission, onward)    None        Assessment/Plan  SBO - CT yesterday with distended loops of small bowel with fecalization and possible transition in the LLQ, concern for SBO, partial colectomy changes noted on the right  - prior abdominal sx: abdominal hysterectomy, lap BSO and LOA, ventral hernia repair, R hemicolectomy, cholecystectomy  - SBO protocol ordered - 8h delay with contrast in colon and improvement in small bowel distention  - passing some flatus  - clamp NGT and allow CLD today, ok to sip some miralax and would encourage ambulation today - possibly DC NGT later today vs tomorrow AM pending more bowel function  - no indication for emergent surgical intervention   FEN: NGT clamped, CLD, decrease IVF VTE:SQH ID: no current abx  LOS: 1 day   I reviewed last 24 h vitals and pain scores, last 48 h intake and output, last 24 h labs and trends, and last 24 h imaging results.    Juliet Rude, [facility] Surgery 09/23/2022, 12:38 PM Please see Amion for pager number during day hours 7:00am-4:30pm

## 2022-09-23 NOTE — Progress Notes (Signed)
PROGRESS NOTE    Jordin [place] Querry  ZOX:[id number] DOB: 01-Jun-1945 DOA: 09/21/2022 PCP: Renford Dills, MD  Outpatient Specialists:     Brief Narrative:  Patient is a 77 year old African-American female with past medical history significant for chronic kidney disease stage IV, coronary artery disease, type 2 diabetes mellitus, CVA, peripheral neuropathy, chronic heart failure, DVT, hypertension, hyperlipidemia, GERD, gout, renal vein thrombosis on Eliquis.  Patient has had prior abdominal surgery.  Patient was admitted with abdominal pain and distention.  Workup revealed SBO.  Patient currently has NG tube to low suction.  Surgical input is appreciated.  Patient is being managed conservatively for now.  09/22/2022: Patient was admitted earlier today.  Patient seen alongside patient's cousin.  No new complaints.  Above documentation noted.   Assessment & Plan:   Principal Problem:   SBO (small bowel obstruction) (HCC) Active Problems:   CAD (coronary artery disease)   Type 2 diabetes mellitus (HCC)   Acute kidney injury superimposed on chronic kidney disease (HCC)   Chronic heart failure with preserved ejection fraction (HFpEF) (HCC)   Mitral regurgitation   Tricuspid regurgitation   SBO Patient with history of prior abdominal surgeries and SBO presenting with complaints of abdominal pain/distention and vomiting since yesterday.  CT concerning for SBO with possible transition point in the left lower quadrant.  General surgery consulted and NG tube placed.  Keep n.p.o., gentle IV fluid hydration, pain management. 09/22/2022: See above documentation. 09/23/2022: Improving.  Clear liquid diet as per surgical team.   CAD Stable, not endorsing any anginal symptoms and EKG without acute ischemic changes.   AKI on CKD stage IV Creatinine currently 3.0 and baseline appears to be 2.3-2.6.  Continue gentle IV fluid hydration and monitor renal function.  Avoid nephrotoxic agents/IV  contrast. 09/22/2022: Serum creatinine has improved from 3.04-2.62.  Continue IV fluids.   Type 2 diabetes A1c 5.5 in August 2023, will repeat.  Sensitive sliding scale insulin every 4 hours.   Chronic HFpEF Mild to moderate MVR Per echo done in August 2023.  No signs of volume overload, check BNP.   Mild to moderate TVR Per echo done in August 2023.   History of DVT in 2022 and renal vein thrombosis in 2019 She is chronically anticoagulated with Eliquis.  Hold anticoagulation until patient is evaluated by general surgery.   QT prolongation Potassium within normal range, check magnesium level.  Avoid QT prolonging drugs. 09/22/2022: Magnesium was 1.3.  IV magnesium sulfate 4 g x 1 dose ordered.  Check magnesium level in the morning.  Check electrolytes in the morning.  EKG in the morning.  QTc interval has improved to 513 ms.   09/23/2022: QTc interval today is 501 ms.   Lupus Hyperlipidemia Hypertension GERD Gout   DVT prophylaxis: SCDs Code Status: Full code Family Communication: Cousin Disposition Plan: This will depend on hospital course.   Consultants:  Surgery  Procedures:  NG tube to low suction  Antimicrobials:  None   Subjective: -Admitted with SBO. -Abdominal pain has resolved.  Objective: Vitals:   09/23/22 0356 09/23/22 0427 09/23/22 0732 09/23/22 1246  BP: (!) 180/72 (!) 177/74 (!) 176/70 (!) 161/80  Pulse: 78  92   Resp: 18  20   Temp: 98.2 F (36.8 C)  97.8 F (36.6 C)   TempSrc: Axillary  Oral   SpO2: 97%  98%   Weight:      Height:        Intake/Output Summary (Last 24 hours) at 09/23/2022  1257 Last data filed at 09/23/2022 0800 Gross per 24 hour  Intake 1677.88 ml  Output 900 ml  Net 777.88 ml   Filed Weights   09/22/22 0519  Weight: 63.5 kg    Examination:  General exam: Appears calm and comfortable.  Patient has NG tube to low suction. Respiratory system: Clear to auscultation.  Cardiovascular system: S1 & S2  heard Gastrointestinal system: Abdomen is soft and nontender.   Central nervous system: Alert and oriented.  Extremities: No leg edema.  Data Reviewed: I have personally reviewed following labs and imaging studies  CBC: Recent Labs  Lab 09/22/22 0004 09/23/22 0219  WBC 9.7 10.2  NEUTROABS  --  8.6*  HGB 11.3* 10.0*  HCT 36.6 32.8*  MCV 88.8 87.9  PLT 325 284    Basic Metabolic Panel: Recent Labs  Lab 09/22/22 0004 09/22/22 0442 09/23/22 0219  NA 141 137 144  K 4.4 3.8 3.6  CL 103 105 112*  CO2 18* 21* 22  GLUCOSE 269* 198* 102*  BUN 41* 36* 31*  CREATININE 3.04* 2.62* 2.16*  CALCIUM 9.9 9.1 8.8*  MG  --  1.3* 2.7*  PHOS  --   --  3.3    GFR: Estimated Creatinine Clearance: 21.1 mL/min (A) (by C-G formula based on SCr of 2.16 mg/dL (H)). Liver Function Tests: Recent Labs  Lab 09/22/22 0004 09/23/22 0219  AST 19  --   ALT 14  --   ALKPHOS 85  --   BILITOT 0.7  --   PROT 7.9  --   ALBUMIN 3.5 2.7*    Recent Labs  Lab 09/22/22 0004  LIPASE 39    No results for input(s): "AMMONIA" in the last 168 hours. Coagulation Profile: No results for input(s): "INR", "PROTIME" in the last 168 hours. Cardiac Enzymes: No results for input(s): "CKTOTAL", "CKMB", "CKMBINDEX", "TROPONINI" in the last 168 hours. BNP (last 3 results) No results for input(s): "PROBNP" in the last 8760 hours. HbA1C: Recent Labs    09/22/22 0442  HGBA1C 5.3    CBG: Recent Labs  Lab 09/22/22 2351 09/23/22 0354 09/23/22 0731 09/23/22 1149 09/23/22 1220  GLUCAP 99 85 78 86 76    Lipid Profile: No results for input(s): "CHOL", "HDL", "LDLCALC", "TRIG", "CHOLHDL", "LDLDIRECT" in the last 72 hours. Thyroid Function Tests: No results for input(s): "TSH", "T4TOTAL", "FREET4", "T3FREE", "THYROIDAB" in the last 72 hours. Anemia Panel: No results for input(s): "VITAMINB12", "FOLATE", "FERRITIN", "TIBC", "IRON", "RETICCTPCT" in the last 72 hours. Urine analysis:    Component Value  Date/Time   COLORURINE YELLOW 09/23/2022 0005   APPEARANCEUR CLEAR 09/23/2022 0005   APPEARANCEUR Clear 08/22/2018 1620   LABSPEC 1.014 09/23/2022 0005   LABSPEC 1.020 03/03/2014 1341   PHURINE 5.0 09/23/2022 0005   GLUCOSEU NEGATIVE 09/23/2022 0005   GLUCOSEU Negative 03/03/2014 1341   HGBUR NEGATIVE 09/23/2022 0005   BILIRUBINUR NEGATIVE 09/23/2022 0005   BILIRUBINUR Negative 08/22/2018 1620   BILIRUBINUR Negative 03/03/2014 1341   KETONESUR NEGATIVE 09/23/2022 0005   PROTEINUR 100 (A) 09/23/2022 0005   UROBILINOGEN 0.2 03/03/2014 1341   NITRITE NEGATIVE 09/23/2022 0005   LEUKOCYTESUR NEGATIVE 09/23/2022 0005   LEUKOCYTESUR Negative 03/03/2014 1341   Sepsis Labs: @LABRCNTIP (procalcitonin:4,lacticidven:4)  )No results found for this or any previous visit (from the past 240 hour(s)).       Radiology Studies: DG Abd Portable 1V-Small Bowel Obstruction Protocol-initial, 8 hr delay  Result Date: 09/22/2022 CLINICAL DATA:  Small bowel obstruction EXAM: PORTABLE ABDOMEN - 1  VIEW COMPARISON:  09/22/2022, 7:34 a.m. FINDINGS: Esophagogastric tube with tip and side port below the diaphragm. Enteric contrast within the gastric fundus. Suspect that there has been additional oral enteric contrast administered, which is diluted throughout the small bowel and colon. General paucity of small bowel gas without obvious bowel distention. Scattered stool and most likely admixed contrast throughout the colon, present to the distal colon, with scattered gas present to at least the sigmoid. No free air. IMPRESSION: 1. Esophagogastric tube with tip and side port below the diaphragm. Enteric contrast within the gastric fundus. Suspect that there has been additional oral enteric contrast administered, which is diluted throughout the small bowel and colon. 2. General paucity of small bowel gas without obvious bowel distention. Scattered stool and most likely admixed contrast throughout the colon, present to the  distal colon, with scattered gas present to at least the sigmoid. Electronically Signed   By: Jearld Lesch M.D.   On: 09/22/2022 18:37   DG Abd Portable 1V-Small Bowel Protocol-Position Verification  Result Date: 09/22/2022 CLINICAL DATA:  Confirm nasogastric tube placement EXAM: PORTABLE ABDOMEN - 1 VIEW COMPARISON:  Earlier today FINDINGS: Enteric tube tip and side-port overlaps the stomach. Normal bowel gas pattern. Over penetrated lung parenchyma on the view over the chest. Normal heart size. IMPRESSION: Enteric tube with tip and side port at the stomach. Electronically Signed   By: Tiburcio Pea M.D.   On: 09/22/2022 08:06   DG Abd Portable 1 View  Result Date: 09/22/2022 CLINICAL DATA:  NG tube placement EXAM: PORTABLE ABDOMEN - 1 VIEW COMPARISON:  11/30/2020 FINDINGS: NG tube tip is in the stomach with the side port near the GE junction. IMPRESSION: NG tube tip in the stomach. Electronically Signed   By: Charlett Nose M.D.   On: 09/22/2022 03:56   CT ABDOMEN PELVIS WO CONTRAST  Addendum Date: 09/22/2022   ADDENDUM REPORT: 09/22/2022 01:37 ADDENDUM: Critical Value/emergent results were called by telephone at the time of interpretation on 09/22/2022 at 1:37 am to provider DAN FLOYD , who verbally acknowledged these results. Electronically Signed   By: Thornell Sartorius M.D.   On: 09/22/2022 01:37   Result Date: 09/22/2022 CLINICAL DATA:  Abdominal pain, acute, nonlocalized. EXAM: CT ABDOMEN AND PELVIS WITHOUT CONTRAST TECHNIQUE: Multidetector CT imaging of the abdomen and pelvis was performed following the standard protocol without IV contrast. RADIATION DOSE REDUCTION: This exam was performed according to the departmental dose-optimization program which includes automated exposure control, adjustment of the mA and/or kV according to patient size and/or use of iterative reconstruction technique. COMPARISON:  07/28/2022. FINDINGS: Lower chest: Multi-vessel coronary artery calcifications are noted. There is  a trace pericardial effusion. No acute abnormality. Hepatobiliary: No focal liver abnormality is seen. Status post cholecystectomy. No biliary dilatation. Pancreas: Unremarkable. No pancreatic ductal dilatation or surrounding inflammatory changes. Spleen: Normal in size without focal abnormality. Adrenals/Urinary Tract: The adrenal glands are within normal limits. A cyst is noted in the mid right kidney. Additional hypodensities are present in the right kidney which are too small to further characterize. No renal calculus or hydronephrosis bilaterally. The bladder is unremarkable. Stomach/Bowel: There is a small hiatal hernia. The stomach is within normal limits. There are mildly distended loops of small bowel in the abdomen with air-fluid levels measuring up to 4.2 cm in diameter in the right lower quadrant. There is distended loop of small bowel with fecalization in the pelvis with surrounding fat stranding with possible transition point in the pelvis in the left  lower quadrant,, best seen on coronal image 40, with decompression of the distal ileum. Partial colectomy changes are noted on the right with anastomotic site in the right lower quadrant. A few scattered diverticula are present along the colon without evidence of diverticulitis. Vascular/Lymphatic: Aortic atherosclerosis. No enlarged abdominal or pelvic lymph nodes. Reproductive: Status post hysterectomy. No adnexal masses. Other: No abdominopelvic ascites. Musculoskeletal: There is soft tissue thickening in the anterior right breast with surgical clips in place. Degenerative changes are present in the thoracolumbar spine. Diffuse increased density is noted in the bones which may be associated with metabolic disease such as renal osteodystrophy. A sclerotic lesion is noted in the femoral head on the left, possible avascular necrosis. No acute osseous abnormality. IMPRESSION: 1. Distended loops of small bowel in the abdomen measuring up to 4.2 cm with  fecalization of the small bowel in the pelvis and possible transition point in the left lower quadrant, concerning for small bowel obstruction. Surgical consultation is recommended. 2. Multi-vessel coronary artery calcifications. 3. Aortic atherosclerosis. 4. Remaining incidental findings as described above. Electronically Signed: By: Thornell Sartorius M.D. On: 09/22/2022 01:34        Scheduled Meds:  amLODipine  10 mg Oral Daily   budesonide  3 mg Oral TID   docusate sodium  100 mg Oral BID   heparin injection (subcutaneous)  5,000 Units Subcutaneous Q12H   hydrALAZINE  50 mg Oral BID   insulin aspart  0-9 Units Subcutaneous Q4H   pantoprazole  40 mg Oral BID   polyethylene glycol  17 g Oral Daily   Continuous Infusions:  sodium chloride 50 mL/hr at 09/23/22 1249     LOS: 1 day    Time spent: 35 minutes.    Berton Mount, MD  [facility] Pager #: [phone] 7PM-7AM contact night coverage as above

## 2022-09-24 ENCOUNTER — Other Ambulatory Visit (HOSPITAL_COMMUNITY): Payer: Self-pay

## 2022-09-24 DIAGNOSIS — K56609 Unspecified intestinal obstruction, unspecified as to partial versus complete obstruction: Secondary | ICD-10-CM | POA: Diagnosis not present

## 2022-09-24 LAB — RENAL FUNCTION PANEL
Albumin: 3.1 g/dL — ABNORMAL LOW (ref 3.5–5.0)
Anion gap: 11 (ref 5–15)
BUN: 24 mg/dL — ABNORMAL HIGH (ref 8–23)
CO2: 19 mmol/L — ABNORMAL LOW (ref 22–32)
Calcium: 9.1 mg/dL (ref 8.9–10.3)
Chloride: 111 mmol/L (ref 98–111)
Creatinine, Ser: 1.82 mg/dL — ABNORMAL HIGH (ref 0.44–1.00)
GFR, Estimated: 29 mL/min — ABNORMAL LOW (ref >60)
Glucose, Bld: 112 mg/dL — ABNORMAL HIGH (ref 70–99)
Phosphorus: 1.7 mg/dL — ABNORMAL LOW (ref 2.5–4.6)
Potassium: 3.6 mmol/L (ref 3.5–5.1)
Sodium: 141 mmol/L (ref 135–145)

## 2022-09-24 LAB — GLUCOSE, CAPILLARY
Glucose-Capillary: 107 mg/dL — ABNORMAL HIGH (ref 70–99)
Glucose-Capillary: 113 mg/dL — ABNORMAL HIGH (ref 70–99)
Glucose-Capillary: 123 mg/dL — ABNORMAL HIGH (ref 70–99)
Glucose-Capillary: 88 mg/dL (ref 70–99)
Glucose-Capillary: 92 mg/dL (ref 70–99)
Glucose-Capillary: 97 mg/dL (ref 70–99)

## 2022-09-24 NOTE — Progress Notes (Signed)
Progress Note     Subjective: Doing well.  + flatus and BM.  Tolerating NGT clamped and CLD  Objective: Vital signs in last 24 hours: Temp:  [97.7 F (36.5 C)-98.3 F (36.8 C)] 98.2 F (36.8 C) (07/08 0405) Pulse Rate:  [79-88] 88 (07/08 0405) Resp:  [18-20] 18 (07/08 0405) BP: (152-161)/(65-80) 157/75 (07/08 0843) SpO2:  [98 %-99 %] 99 % (07/08 0405) Last BM Date : 09/23/22  Intake/Output from previous day: 07/07 0701 - 07/08 0700 In: 1738.1 [P.O.:200; I.V.:1448.1; NG/GT:90] Out: 950 [Urine:750; Emesis/NG output:200] Intake/Output this shift: Total I/O In: -  Out: 250 [Urine:250]  PE: General: pleasant, WD, WN female who is laying in bed in NAD Abd:  soft, NT, ND, +BS, NGT clamped   Lab Results:  Recent Labs    09/22/22 0004 09/23/22 0219  WBC 9.7 10.2  HGB 11.3* 10.0*  HCT 36.6 32.8*  PLT 325 284   BMET Recent Labs    09/22/22 0442 09/23/22 0219  NA 137 144  K 3.8 3.6  CL 105 112*  CO2 21* 22  GLUCOSE 198* 102*  BUN 36* 31*  CREATININE 2.62* 2.16*  CALCIUM 9.1 8.8*   PT/INR No results for input(s): "LABPROT", "INR" in the last 72 hours. CMP     Component Value Date/Time   NA 144 09/23/2022 0219   NA 144 07/19/2020 1458   NA 140 03/14/2017 0810   K 3.6 09/23/2022 0219   K 3.8 03/14/2017 0810   CL 112 (H) 09/23/2022 0219   CO2 22 09/23/2022 0219   CO2 27 03/14/2017 0810   GLUCOSE 102 (H) 09/23/2022 0219   GLUCOSE 177 (H) 03/14/2017 0810   BUN 31 (H) 09/23/2022 0219   BUN 85 (HH) 07/19/2020 1458   BUN 35.6 (H) 03/14/2017 0810   CREATININE 2.16 (H) 09/23/2022 0219   CREATININE 3.30 (H) 05/24/2022 1405   CREATININE 1.8 (H) 03/14/2017 0810   CALCIUM 8.8 (L) 09/23/2022 0219   CALCIUM 9.6 03/14/2017 0810   PROT 7.9 09/22/2022 0004   PROT 7.8 03/14/2017 0810   ALBUMIN 2.7 (L) 09/23/2022 0219   ALBUMIN 3.5 03/14/2017 0810   AST 19 09/22/2022 0004   AST 16 05/24/2022 1405   AST 17 03/14/2017 0810   ALT 14 09/22/2022 0004   ALT 16  05/24/2022 1405   ALT 17 03/14/2017 0810   ALKPHOS 85 09/22/2022 0004   ALKPHOS 61 03/14/2017 0810   BILITOT 0.7 09/22/2022 0004   BILITOT 0.3 05/24/2022 1405   BILITOT 0.28 03/14/2017 0810   GFRNONAA 23 (L) 09/23/2022 0219   GFRNONAA 14 (L) 05/24/2022 1405   GFRNONAA 45 (L) 12/17/2013 1158   GFRAA 21 (L) 12/21/2019 0845   GFRAA 52 (L) 12/17/2013 1158   Lipase     Component Value Date/Time   LIPASE 39 09/22/2022 0004       Studies/Results: DG Abd Portable 1V-Small Bowel Obstruction Protocol-24 hr delay  Result Date: 09/23/2022 CLINICAL DATA:  NG tube EXAM: PORTABLE ABDOMEN - 1 VIEW COMPARISON:  Abdominal x-ray 09/22/2022 FINDINGS: Nasogastric tube tip is in the body of the stomach, unchanged. Cholecystectomy clips are present. No dilated bowel loops are seen. No suspicious calcifications. There are degenerative changes of the lower lumbar spine. IMPRESSION: Nasogastric tube tip in the body of the stomach. Electronically Signed   By: Darliss Cheney M.D.   On: 09/23/2022 20:07   DG Abd Portable 1V-Small Bowel Obstruction Protocol-initial, 8 hr delay  Result Date: 09/22/2022 CLINICAL DATA:  Small  bowel obstruction EXAM: PORTABLE ABDOMEN - 1 VIEW COMPARISON:  09/22/2022, 7:34 a.m. FINDINGS: Esophagogastric tube with tip and side port below the diaphragm. Enteric contrast within the gastric fundus. Suspect that there has been additional oral enteric contrast administered, which is diluted throughout the small bowel and colon. General paucity of small bowel gas without obvious bowel distention. Scattered stool and most likely admixed contrast throughout the colon, present to the distal colon, with scattered gas present to at least the sigmoid. No free air. IMPRESSION: 1. Esophagogastric tube with tip and side port below the diaphragm. Enteric contrast within the gastric fundus. Suspect that there has been additional oral enteric contrast administered, which is diluted throughout the small bowel  and colon. 2. General paucity of small bowel gas without obvious bowel distention. Scattered stool and most likely admixed contrast throughout the colon, present to the distal colon, with scattered gas present to at least the sigmoid. Electronically Signed   By: Jearld Lesch M.D.   On: 09/22/2022 18:37    Anti-infectives: Anti-infectives (From admission, onward)    None        Assessment/Plan  SBO - resolving - tolerating CLD and NGT clamped -DC NGT, soft diet today.  If tolerates may DC home later today vs tomorrow -d/w primary service  FEN: soft diet VTE:SQH ID: no current abx  LOS: 2 days   I reviewed last 24 h vitals and pain scores, last 48 h intake and output, last 24 h labs and trends, and last 24 h imaging results.    Letha Cape, [facility] 09/24/2022, 11:55 AM Please see Amion for pager number during day hours 7:00am-4:30pm

## 2022-09-24 NOTE — Progress Notes (Signed)
PROGRESS NOTE    [name]  ZOX:[id number] DOB: 07-Oct-1945 DOA: 09/21/2022 PCP: Renford Dills, MD  Outpatient Specialists:     Brief Narrative:  Patient is a 77 year old African-American female with past medical history significant for chronic kidney disease stage IV, coronary artery disease, type 2 diabetes mellitus, CVA, peripheral neuropathy, chronic heart failure, DVT, hypertension, hyperlipidemia, GERD, gout, renal vein thrombosis on Eliquis.  Patient has had prior abdominal surgery.  Patient was admitted with abdominal pain and distention.  Workup revealed SBO.  Patient currently has NG tube to low suction.  Surgical input is appreciated.  Patient is being managed conservatively for now.  09/22/2022: Patient was admitted earlier today.  Patient seen alongside patient's cousin.  No new complaints.  Above documentation noted.  09/24/2022: Patient reported 2 bowel movements today.  Surgical input is appreciated.  Will discharge patient, once cleared for discharge by the surgical team.   Assessment & Plan:   Principal Problem:   SBO (small bowel obstruction) (HCC) Active Problems:   CAD (coronary artery disease)   Type 2 diabetes mellitus (HCC)   Acute kidney injury superimposed on chronic kidney disease (HCC)   Chronic heart failure with preserved ejection fraction (HFpEF) (HCC)   Mitral regurgitation   Tricuspid regurgitation   SBO Patient with history of prior abdominal surgeries and SBO presenting with complaints of abdominal pain/distention and vomiting since yesterday.  CT concerning for SBO with possible transition point in the left lower quadrant.  General surgery consulted and NG tube placed.  Keep n.p.o., gentle IV fluid hydration, pain management. 09/22/2022: See above documentation. 09/23/2022: Improving.  Clear liquid diet as per surgical team. 09/24/2022: Patient continues to improve.  2 bowel movements reported.  Discharge patient was cleared for discharge by the  surgical team.   CAD Stable, not endorsing any anginal symptoms and EKG without acute ischemic changes.   AKI on CKD stage IV Creatinine currently 3.0 and baseline appears to be 2.3-2.6.  Continue gentle IV fluid hydration and monitor renal function.  Avoid nephrotoxic agents/IV contrast. 09/22/2022: Serum creatinine has improved from 3.04-2.62.  Continue IV fluids.   Type 2 diabetes A1c 5.5 in August 2023, will repeat.  Sensitive sliding scale insulin every 4 hours.   Chronic HFpEF Mild to moderate MVR Per echo done in August 2023.  No signs of volume overload, check BNP.   Mild to moderate TVR Per echo done in August 2023.   History of DVT in 2022 and renal vein thrombosis in 2019 She is chronically anticoagulated with Eliquis.  Hold anticoagulation until patient is evaluated by general surgery.   QT prolongation Potassium within normal range, check magnesium level.  Avoid QT prolonging drugs. 09/22/2022: Magnesium was 1.3.  IV magnesium sulfate 4 g x 1 dose ordered.  Check magnesium level in the morning.  Check electrolytes in the morning.  EKG in the morning.  QTc interval has improved to 513 ms.   09/23/2022: QTc interval today is 501 ms.  Hypomagnesemia: -Magnesium of 1.3. -IV magnesium 4 g x 1 dose given.    Lupus Hyperlipidemia Hypertension GERD Gout   DVT prophylaxis: SCDs Code Status: Full code Family Communication: Cousin Disposition Plan: This will depend on hospital course.   Consultants:  Surgery  Procedures:  NG tube to low suction  Antimicrobials:  None   Subjective: -No new complaints. -Patient had 2 bowel movements today.  Objective: Vitals:   09/24/22 0357 09/24/22 0405 09/24/22 0843 09/24/22 1204  BP:  (!) 156/70 Marland Kitchen)  157/75 (!) 175/77  Pulse: 81 88  86  Resp:  18  17  Temp:  98.2 F (36.8 C)  98.5 F (36.9 C)  TempSrc:  Oral  Oral  SpO2: 98% 99%    Weight:      Height:        Intake/Output Summary (Last 24 hours) at 09/24/2022  1901 Last data filed at 09/24/2022 0842 Gross per 24 hour  Intake 1678.1 ml  Output 700 ml  Net 978.1 ml    Filed Weights   09/22/22 0519  Weight: 63.5 kg    Examination:  General exam: Appears calm and comfortable.  Patient has NG tube to low suction. Respiratory system: Clear to auscultation.  Cardiovascular system: S1 & S2 heard Gastrointestinal system: Abdomen is soft and nontender.   Central nervous system: Alert and oriented.  Extremities: No leg edema.  Data Reviewed: I have personally reviewed following labs and imaging studies  CBC: Recent Labs  Lab 09/22/22 0004 09/23/22 0219  WBC 9.7 10.2  NEUTROABS  --  8.6*  HGB 11.3* 10.0*  HCT 36.6 32.8*  MCV 88.8 87.9  PLT 325 284    Basic Metabolic Panel: Recent Labs  Lab 09/22/22 0004 09/22/22 0442 09/23/22 0219 09/24/22 1219  NA 141 137 144 141  K 4.4 3.8 3.6 3.6  CL 103 105 112* 111  CO2 18* 21* 22 19*  GLUCOSE 269* 198* 102* 112*  BUN 41* 36* 31* 24*  CREATININE 3.04* 2.62* 2.16* 1.82*  CALCIUM 9.9 9.1 8.8* 9.1  MG  --  1.3* 2.7*  --   PHOS  --   --  3.3 1.7*    GFR: Estimated Creatinine Clearance: 25 mL/min (A) (by C-G formula based on SCr of 1.82 mg/dL (H)). Liver Function Tests: Recent Labs  Lab 09/22/22 0004 09/23/22 0219 09/24/22 1219  AST 19  --   --   ALT 14  --   --   ALKPHOS 85  --   --   BILITOT 0.7  --   --   PROT 7.9  --   --   ALBUMIN 3.5 2.7* 3.1*    Recent Labs  Lab 09/22/22 0004  LIPASE 39    No results for input(s): "AMMONIA" in the last 168 hours. Coagulation Profile: No results for input(s): "INR", "PROTIME" in the last 168 hours. Cardiac Enzymes: No results for input(s): "CKTOTAL", "CKMB", "CKMBINDEX", "TROPONINI" in the last 168 hours. BNP (last 3 results) No results for input(s): "PROBNP" in the last 8760 hours. HbA1C: Recent Labs    09/22/22 0442  HGBA1C 5.3    CBG: Recent Labs  Lab 09/24/22 0000 09/24/22 0406 09/24/22 0728 09/24/22 1206  09/24/22 1648  GLUCAP 88 92 97 123* 113*    Lipid Profile: No results for input(s): "CHOL", "HDL", "LDLCALC", "TRIG", "CHOLHDL", "LDLDIRECT" in the last 72 hours. Thyroid Function Tests: No results for input(s): "TSH", "T4TOTAL", "FREET4", "T3FREE", "THYROIDAB" in the last 72 hours. Anemia Panel: No results for input(s): "VITAMINB12", "FOLATE", "FERRITIN", "TIBC", "IRON", "RETICCTPCT" in the last 72 hours. Urine analysis:    Component Value Date/Time   COLORURINE YELLOW 09/23/2022 0005   APPEARANCEUR CLEAR 09/23/2022 0005   APPEARANCEUR Clear 08/22/2018 1620   LABSPEC 1.014 09/23/2022 0005   LABSPEC 1.020 03/03/2014 1341   PHURINE 5.0 09/23/2022 0005   GLUCOSEU NEGATIVE 09/23/2022 0005   GLUCOSEU Negative 03/03/2014 1341   HGBUR NEGATIVE 09/23/2022 0005   BILIRUBINUR NEGATIVE 09/23/2022 0005   BILIRUBINUR Negative 08/22/2018 1620  BILIRUBINUR Negative 03/03/2014 1341   KETONESUR NEGATIVE 09/23/2022 0005   PROTEINUR 100 (A) 09/23/2022 0005   UROBILINOGEN 0.2 03/03/2014 1341   NITRITE NEGATIVE 09/23/2022 0005   LEUKOCYTESUR NEGATIVE 09/23/2022 0005   LEUKOCYTESUR Negative 03/03/2014 1341   Sepsis Labs: @LABRCNTIP (procalcitonin:4,lacticidven:4)  )No results found for this or any previous visit (from the past 240 hour(s)).       Radiology Studies: DG Abd Portable 1V-Small Bowel Obstruction Protocol-24 hr delay  Result Date: 09/23/2022 CLINICAL DATA:  NG tube EXAM: PORTABLE ABDOMEN - 1 VIEW COMPARISON:  Abdominal x-ray 09/22/2022 FINDINGS: Nasogastric tube tip is in the body of the stomach, unchanged. Cholecystectomy clips are present. No dilated bowel loops are seen. No suspicious calcifications. There are degenerative changes of the lower lumbar spine. IMPRESSION: Nasogastric tube tip in the body of the stomach. Electronically Signed   By: Darliss Cheney M.D.   On: 09/23/2022 20:07        Scheduled Meds:  amLODipine  10 mg Oral Daily   budesonide  3 mg Oral TID    docusate sodium  100 mg Oral BID   heparin injection (subcutaneous)  5,000 Units Subcutaneous Q12H   hydrALAZINE  50 mg Oral BID   insulin aspart  0-9 Units Subcutaneous Q4H   pantoprazole  40 mg Oral BID   polyethylene glycol  17 g Oral Daily   Continuous Infusions:  sodium chloride 50 mL/hr at 09/24/22 0348     LOS: 2 days    Time spent: 35 minutes.    Berton Mount, MD  [facility] Pager #: [phone] 7PM-7AM contact night coverage as above

## 2022-09-25 DIAGNOSIS — K56609 Unspecified intestinal obstruction, unspecified as to partial versus complete obstruction: Secondary | ICD-10-CM | POA: Diagnosis not present

## 2022-09-25 LAB — GLUCOSE, CAPILLARY
Glucose-Capillary: 97 mg/dL (ref 70–99)
Glucose-Capillary: 94 mg/dL (ref 70–99)
Glucose-Capillary: 105 mg/dL — ABNORMAL HIGH (ref 70–99)

## 2022-09-25 MED ORDER — DOCUSATE SODIUM 100 MG PO CAPS: 100.0000 mg | ORAL_CAPSULE | Freq: Two times a day (BID) | ORAL | 0 refills | Status: DC

## 2022-09-25 MED ORDER — BUDESONIDE 3 MG PO CPEP: 3.0000 mg | ORAL_CAPSULE | Freq: Three times a day (TID) | ORAL | 1 refills | Status: AC

## 2022-09-25 MED ORDER — HYDRALAZINE HCL 50 MG PO TABS: 50.0000 mg | ORAL_TABLET | Freq: Two times a day (BID) | ORAL | 1 refills | Status: DC

## 2022-09-25 NOTE — Progress Notes (Signed)
Mobility Specialist Progress Note:   09/25/22 1030  Mobility  Activity Ambulated with assistance in hallway  Level of Assistance Contact guard assist, steadying assist  Assistive Device None  Distance Ambulated (ft) 450 ft  Activity Response Tolerated well  Mobility Referral Yes  $Mobility charge 1 Mobility  Mobility Specialist Start Time (ACUTE ONLY) 1030  Mobility Specialist Stop Time (ACUTE ONLY) 1040  Mobility Specialist Time Calculation (min) (ACUTE ONLY) 10 min   Pt agreeable to mobility session. Required only minG assist throughout. No c/o during ambulation. Back in chair with all needs met.  Addison Lank Mobility Specialist Please contact via SecureChat or  Rehab office at [phone]

## 2022-09-25 NOTE — Progress Notes (Signed)
Progress Note     Subjective: Doing well.  + flatus and BM.  Tolerating Soft diet   Objective: Vital signs in last 24 hours: Temp:  [97.9 F (36.6 C)-98.9 F (37.2 C)] 98.9 F (37.2 C) (07/09 0757) Pulse Rate:  [79-86] 86 (07/09 0757) Resp:  [16-18] 16 (07/09 0757) BP: (158-182)/(65-77) 158/65 (07/09 0757) SpO2:  [98 %-100 %] 100 % (07/09 0757) Last BM Date : 09/24/22  Intake/Output from previous day: 07/08 0701 - 07/09 0700 In: -  Out: 250 [Urine:250] Intake/Output this shift: No intake/output data recorded.  PE: General: pleasant, WD, WN female who is sitting up in chair Resp: non labored  Abd:  soft, NT, ND, +BS   Lab Results:  Recent Labs    09/23/22 0219  WBC 10.2  HGB 10.0*  HCT 32.8*  PLT 284    BMET Recent Labs    09/23/22 0219 09/24/22 1219  NA 144 141  K 3.6 3.6  CL 112* 111  CO2 22 19*  GLUCOSE 102* 112*  BUN 31* 24*  CREATININE 2.16* 1.82*  CALCIUM 8.8* 9.1    PT/INR No results for input(s): "LABPROT", "INR" in the last 72 hours. CMP     Component Value Date/Time   NA 141 09/24/2022 1219   NA 144 07/19/2020 1458   NA 140 03/14/2017 0810   K 3.6 09/24/2022 1219   K 3.8 03/14/2017 0810   CL 111 09/24/2022 1219   CO2 19 (L) 09/24/2022 1219   CO2 27 03/14/2017 0810   GLUCOSE 112 (H) 09/24/2022 1219   GLUCOSE 177 (H) 03/14/2017 0810   BUN 24 (H) 09/24/2022 1219   BUN 85 (HH) 07/19/2020 1458   BUN 35.6 (H) 03/14/2017 0810   CREATININE 1.82 (H) 09/24/2022 1219   CREATININE 3.30 (H) 05/24/2022 1405   CREATININE 1.8 (H) 03/14/2017 0810   CALCIUM 9.1 09/24/2022 1219   CALCIUM 9.6 03/14/2017 0810   PROT 7.9 09/22/2022 0004   PROT 7.8 03/14/2017 0810   ALBUMIN 3.1 (L) 09/24/2022 1219   ALBUMIN 3.5 03/14/2017 0810   AST 19 09/22/2022 0004   AST 16 05/24/2022 1405   AST 17 03/14/2017 0810   ALT 14 09/22/2022 0004   ALT 16 05/24/2022 1405   ALT 17 03/14/2017 0810   ALKPHOS 85 09/22/2022 0004   ALKPHOS 61 03/14/2017 0810    BILITOT 0.7 09/22/2022 0004   BILITOT 0.3 05/24/2022 1405   BILITOT 0.28 03/14/2017 0810   GFRNONAA 29 (L) 09/24/2022 1219   GFRNONAA 14 (L) 05/24/2022 1405   GFRNONAA 45 (L) 12/17/2013 1158   GFRAA 21 (L) 12/21/2019 0845   GFRAA 52 (L) 12/17/2013 1158   Lipase     Component Value Date/Time   LIPASE 39 09/22/2022 0004       Studies/Results: DG Abd Portable 1V-Small Bowel Obstruction Protocol-24 hr delay  Result Date: 09/23/2022 CLINICAL DATA:  NG tube EXAM: PORTABLE ABDOMEN - 1 VIEW COMPARISON:  Abdominal x-ray 09/22/2022 FINDINGS: Nasogastric tube tip is in the body of the stomach, unchanged. Cholecystectomy clips are present. No dilated bowel loops are seen. No suspicious calcifications. There are degenerative changes of the lower lumbar spine. IMPRESSION: Nasogastric tube tip in the body of the stomach. Electronically Signed   By: Darliss Cheney M.D.   On: 09/23/2022 20:07    Anti-infectives: Anti-infectives (From admission, onward)    None        Assessment/Plan  SBO - resolving - tolerating soft diet - stable for DC from  surgery standpoint, recommend DC imodium at home - d/w primary service  FEN: soft diet VTE:SQH ID: no current abx  LOS: 3 days   I reviewed hospitalist notes, last 24 h vitals and pain scores, last 48 h intake and output, and last 24 h labs and trends.    Juliet Rude, [facility] Surgery 09/25/2022, 10:30 AM Please see Amion for pager number during day hours 7:00am-4:30pm

## 2022-09-25 NOTE — Discharge Summary (Signed)
Physician Discharge Summary  Patient ID: [name] MRN: [id number] DOB/AGE: 75-Mar-1947 75 y.o.  Admit date: 09/21/2022 Discharge date: 09/25/2022  Admission Diagnoses:  Discharge Diagnoses:  Principal Problem:   SBO (small bowel obstruction) (HCC) Active Problems:   CAD (coronary artery disease)   Type 2 diabetes mellitus (HCC)   Acute kidney injury superimposed on chronic kidney disease (HCC)   Chronic heart failure with preserved ejection fraction (HFpEF) (HCC)   Mitral regurgitation   Tricuspid regurgitation   Discharged Condition: stable  Hospital Course:  Patient is a 77 year old African-American female with past medical history significant for chronic kidney disease stage IV, coronary artery disease, type 2 diabetes mellitus, CVA, peripheral neuropathy, chronic heart failure, DVT, hypertension, hyperlipidemia, GERD, gout, renal vein thrombosis on Eliquis.  Patient has had prior abdominal surgery.  Patient was admitted with abdominal pain and distention.  Workup revealed small bowel obstruction.  Patient was admitted and managed conservatively.  Small bowel obstruction has resolved.  Patient has been cleared for discharge by the surgical team.    SBO -Patient has history of abdominal surgeries and small bowel obstruction. -Patient presented with abdominal pain and distention. -CT scan of the abdomen and pelvis was concerning for small bowel of obstruction with possible transition point at the left lower quadrant.   -General surgery consulted and NG tube placed.  Patient was kept NPO.  Patient was gently hydrated.  Patient's pain was optimized.  With conservative management, patient improved significantly. -Patient has tolerated diet orally.  Surgical team is cleared patient for discharge.    CAD Stable, not endorsing any anginal symptoms and EKG without acute ischemic changes.   AKI on CKD stage IV Creatinine currently 3.0 and baseline appears to be 2.3-2.6.  Continue  gentle IV fluid hydration and monitor renal function.  Avoid nephrotoxic agents/IV contrast. 09/22/2022: Serum creatinine has improved from 3.04-2.62.  Continue IV fluids.   Type 2 diabetes A1c 5.5 in August 2023, will repeat.  Sensitive sliding scale insulin every 4 hours.   Chronic HFpEF Mild to moderate MVR Per echo done in August 2023.  No signs of volume overload, check BNP.   Mild to moderate TVR Per echo done in August 2023.   History of DVT in 2022 and renal vein thrombosis in 2019 She is chronically anticoagulated with Eliquis.  Hold anticoagulation until patient is evaluated by general surgery.   QT prolongation Potassium within normal range, check magnesium level.  Avoid QT prolonging drugs. 09/22/2022: Magnesium was 1.3.  IV magnesium sulfate 4 g x 1 dose ordered.  Check magnesium level in the morning.  Check electrolytes in the morning.  EKG in the morning.  QTc interval has improved to 513 ms.   09/23/2022: QTc interval today is 501 ms.   Hypomagnesemia: -Magnesium of 1.3. -IV magnesium 4 g x 1 dose given.     Lupus Hyperlipidemia Hypertension GERD Gout  Consults: general surgery  Significant Diagnostic Studies:  CT abdomen and pelvis without contrast revealed: 1. Distended loops of small bowel in the abdomen measuring up to 4.2 cm with fecalization of the small bowel in the pelvis and possible transition point in the left lower quadrant, concerning for small bowel obstruction. Surgical consultation is recommended. 2. Multi-vessel coronary artery calcifications. 3. Aortic atherosclerosis.  Discharge Exam: Blood pressure (!) 158/65, pulse 86, temperature 98.9 F (37.2 C), temperature source Oral, resp. rate 16, height 5\' 6"  (1.676 m), weight 63.5 kg, SpO2 100 %.   Disposition: Discharge disposition: 01-Home or Self  Care       Discharge Instructions     Diet - low sodium heart healthy   Complete by: As directed    Increase activity slowly   Complete  by: As directed       Allergies as of 09/25/2022       Reactions   Anesthetics, Halogenated Other (See Comments)   Per patient's daughter, she can not tolerate anesthetics.    Etomidate Other (See Comments)   Heart stopped during anesthesia   Fentanyl Other (See Comments)   Heart stopped during anesthesia   Percocet [oxycodone-acetaminophen] Other (See Comments)   Headaches   Diazepam Other (See Comments)   Agitation   Lorazepam Nausea Only   Tramadol Hcl Nausea And Vomiting   Haloperidol Nausea And Vomiting   **Haldol**   Morphine Sulfate Nausea Only   agitation   Propoxyphene Nausea And Vomiting   **Darvocet** no longer available   Versed [midazolam] Other (See Comments)   Heart stopped during anesthesia        Medication List     STOP taking these medications    FLUoxetine 20 MG capsule Commonly known as: PROZAC   loperamide 2 MG capsule Commonly known as: IMODIUM       TAKE these medications    acetaminophen 500 MG tablet Commonly known as: TYLENOL Take 1 tablet (500 mg total) by mouth every 6 (six) hours as needed. What changed: reasons to take this   amLODipine 10 MG tablet Commonly known as: NORVASC Take 1 tablet (10 mg total) by mouth daily.   budesonide 3 MG 24 hr capsule Commonly known as: ENTOCORT EC Take 1 capsule (3 mg total) by mouth 3 (three) times daily.   colestipol 1 g tablet Commonly known as: COLESTID Take 2 g by mouth at bedtime.   Creon 36000 UNITS Cpep capsule Generic drug: lipase/protease/amylase Take 36,000 Units by mouth 3 (three) times daily with meals.   docusate sodium 100 MG capsule Commonly known as: COLACE Take 1 capsule (100 mg total) by mouth 2 (two) times daily.   Eliquis 2.5 MG Tabs tablet Generic drug: apixaban Take 1 tablet (2.5 mg total) by mouth 2 (two) times daily.   famotidine 20 MG tablet Commonly known as: PEPCID Take 20 mg by mouth daily as needed for heartburn or indigestion.   furosemide 40  MG tablet Commonly known as: LASIX Take 1 tablet (40 mg total) by mouth daily. What changed: when to take this   hydrALAZINE 50 MG tablet Commonly known as: APRESOLINE Take 1 tablet (50 mg total) by mouth in the morning and at bedtime.   mirtazapine 7.5 MG tablet Commonly known as: REMERON Take 1 tablet (7.5 mg total) by mouth at bedtime.   nitroGLYCERIN 0.4 MG SL tablet Commonly known as: NITROSTAT DISSOLVE 1 TABLET UNDER THE TONGUE EVERY 5 MINUTES AS  NEEDED FOR CHEST PAIN. MAX  OF 3 TABLETS IN 15 MINUTES. CALL 911 IF PAIN PERSISTS. What changed: See the new instructions.   pantoprazole 40 MG tablet Commonly known as: PROTONIX Take 1 tablet (40 mg total) by mouth 2 (two) times daily.   tiZANidine 4 MG capsule Commonly known as: Zanaflex Take 1 capsule (4 mg total) by mouth 3 (three) times daily.       Time spent: 35 minutes.  SignedBarnetta Chapel 09/25/2022, 3:04 PM

## 2022-09-25 NOTE — Care Management Important Message (Signed)
Important Message  Patient Details  Name: [name] MRN: [id number] Date of Birth: 1945/05/12   Medicare Important Message Given:  Yes     Renie Ora 09/25/2022, 8:36 AM

## 2022-09-27 ENCOUNTER — Inpatient Hospital Stay: Payer: Medicare HMO

## 2022-09-27 ENCOUNTER — Inpatient Hospital Stay: Payer: Medicare HMO | Attending: Hematology

## 2022-09-27 VITALS — BP 169/69 | HR 82 | Temp 99.0°F | Resp 18

## 2022-09-27 DIAGNOSIS — N184 Chronic kidney disease, stage 4 (severe): Secondary | ICD-10-CM | POA: Insufficient documentation

## 2022-09-27 DIAGNOSIS — D509 Iron deficiency anemia, unspecified: Secondary | ICD-10-CM

## 2022-09-27 DIAGNOSIS — E538 Deficiency of other specified B group vitamins: Secondary | ICD-10-CM | POA: Insufficient documentation

## 2022-09-27 DIAGNOSIS — D631 Anemia in chronic kidney disease: Secondary | ICD-10-CM | POA: Diagnosis present

## 2022-09-27 DIAGNOSIS — D464 Refractory anemia, unspecified: Secondary | ICD-10-CM

## 2022-09-27 LAB — CBC WITH DIFFERENTIAL (CANCER CENTER ONLY)
Abs Immature Granulocytes: 0.09 10*3/uL — ABNORMAL HIGH (ref 0.00–0.07)
Basophils Absolute: 0 10*3/uL (ref 0.0–0.1)
Basophils Relative: 0 %
Eosinophils Absolute: 0 10*3/uL (ref 0.0–0.5)
Eosinophils Relative: 0 %
HCT: 33.7 % — ABNORMAL LOW (ref 36.0–46.0)
Hemoglobin: 10.8 g/dL — ABNORMAL LOW (ref 12.0–15.0)
Immature Granulocytes: 1 %
Lymphocytes Relative: 10 %
Lymphs Abs: 1.2 10*3/uL (ref 0.7–4.0)
MCH: 28.3 pg (ref 26.0–34.0)
MCHC: 32 g/dL (ref 30.0–36.0)
MCV: 88.2 fL (ref 80.0–100.0)
Monocytes Absolute: 0.6 10*3/uL (ref 0.1–1.0)
Monocytes Relative: 5 %
Neutro Abs: 9.7 10*3/uL — ABNORMAL HIGH (ref 1.7–7.7)
Neutrophils Relative %: 84 %
Platelet Count: 240 10*3/uL (ref 150–400)
RBC: 3.82 MIL/uL — ABNORMAL LOW (ref 3.87–5.11)
RDW: 17 % — ABNORMAL HIGH (ref 11.5–15.5)
WBC Count: 11.6 10*3/uL — ABNORMAL HIGH (ref 4.0–10.5)
nRBC: 0 % (ref 0.0–0.2)

## 2022-09-27 LAB — FERRITIN: Ferritin: 231 ng/mL (ref 11–307)

## 2022-09-27 LAB — SAMPLE TO BLOOD BANK

## 2022-09-27 MED ORDER — CYANOCOBALAMIN 1000 MCG/ML IJ SOLN
1000.0000 ug | Freq: Once | INTRAMUSCULAR | Status: AC
  Administered 2022-09-27: 1000 ug via INTRAMUSCULAR
  Filled 2022-09-27: qty 1

## 2022-09-27 MED ORDER — EPOETIN ALFA-EPBX 40000 UNIT/ML IJ SOLN
40000.0000 [IU] | Freq: Once | INTRAMUSCULAR | Status: AC
  Administered 2022-09-27: 40000 [IU] via SUBCUTANEOUS
  Filled 2022-09-27: qty 1

## 2022-09-29 ENCOUNTER — Telehealth: Payer: Self-pay

## 2022-10-04 ENCOUNTER — Inpatient Hospital Stay: Payer: Medicare HMO

## 2022-10-04 DIAGNOSIS — E1169 Type 2 diabetes mellitus with other specified complication: Secondary | ICD-10-CM | POA: Diagnosis not present

## 2022-10-04 DIAGNOSIS — I120 Hypertensive chronic kidney disease with stage 5 chronic kidney disease or end stage renal disease: Secondary | ICD-10-CM | POA: Diagnosis not present

## 2022-10-04 DIAGNOSIS — R413 Other amnesia: Secondary | ICD-10-CM | POA: Diagnosis not present

## 2022-10-04 DIAGNOSIS — N185 Chronic kidney disease, stage 5: Secondary | ICD-10-CM | POA: Diagnosis not present

## 2022-10-04 DIAGNOSIS — E1122 Type 2 diabetes mellitus with diabetic chronic kidney disease: Secondary | ICD-10-CM | POA: Diagnosis not present

## 2022-10-04 DIAGNOSIS — E785 Hyperlipidemia, unspecified: Secondary | ICD-10-CM | POA: Diagnosis not present

## 2022-10-04 DIAGNOSIS — I5032 Chronic diastolic (congestive) heart failure: Secondary | ICD-10-CM | POA: Diagnosis not present

## 2022-10-04 DIAGNOSIS — M109 Gout, unspecified: Secondary | ICD-10-CM | POA: Diagnosis not present

## 2022-10-04 DIAGNOSIS — N2581 Secondary hyperparathyroidism of renal origin: Secondary | ICD-10-CM | POA: Diagnosis not present

## 2022-10-08 ENCOUNTER — Encounter: Payer: Self-pay | Admitting: Hematology

## 2022-10-11 ENCOUNTER — Inpatient Hospital Stay: Payer: Medicare HMO

## 2022-10-11 DIAGNOSIS — D509 Iron deficiency anemia, unspecified: Secondary | ICD-10-CM

## 2022-10-11 DIAGNOSIS — D464 Refractory anemia, unspecified: Secondary | ICD-10-CM

## 2022-10-11 DIAGNOSIS — E538 Deficiency of other specified B group vitamins: Secondary | ICD-10-CM

## 2022-10-11 DIAGNOSIS — N184 Chronic kidney disease, stage 4 (severe): Secondary | ICD-10-CM | POA: Diagnosis not present

## 2022-10-11 DIAGNOSIS — D631 Anemia in chronic kidney disease: Secondary | ICD-10-CM | POA: Diagnosis not present

## 2022-10-11 DIAGNOSIS — Z170 Estrogen receptor positive status [ER+]: Secondary | ICD-10-CM

## 2022-10-11 LAB — SAMPLE TO BLOOD BANK

## 2022-10-11 LAB — CBC WITH DIFFERENTIAL (CANCER CENTER ONLY)
Abs Immature Granulocytes: 0.05 10*3/uL (ref 0.00–0.07)
Basophils Absolute: 0 10*3/uL (ref 0.0–0.1)
Basophils Relative: 1 %
Eosinophils Absolute: 0 10*3/uL (ref 0.0–0.5)
Eosinophils Relative: 0 %
HCT: 34.4 % — ABNORMAL LOW (ref 36.0–46.0)
Hemoglobin: 10.6 g/dL — ABNORMAL LOW (ref 12.0–15.0)
Immature Granulocytes: 1 %
Lymphocytes Relative: 13 %
Lymphs Abs: 1.1 10*3/uL (ref 0.7–4.0)
MCH: 27.4 pg (ref 26.0–34.0)
MCHC: 30.8 g/dL (ref 30.0–36.0)
MCV: 88.9 fL (ref 80.0–100.0)
Monocytes Absolute: 0.6 10*3/uL (ref 0.1–1.0)
Monocytes Relative: 7 %
Neutro Abs: 6.7 10*3/uL (ref 1.7–7.7)
Neutrophils Relative %: 78 %
Platelet Count: 309 10*3/uL (ref 150–400)
RBC: 3.87 MIL/uL (ref 3.87–5.11)
RDW: 18.1 % — ABNORMAL HIGH (ref 11.5–15.5)
WBC Count: 8.5 10*3/uL (ref 4.0–10.5)
nRBC: 0 % (ref 0.0–0.2)

## 2022-10-11 LAB — VITAMIN B12: Vitamin B-12: 829 pg/mL (ref 180–914)

## 2022-10-11 MED ORDER — EPOETIN ALFA-EPBX 40000 UNIT/ML IJ SOLN: 40000.0000 [IU] | Freq: Once | INTRAMUSCULAR | Status: DC

## 2022-10-11 NOTE — Progress Notes (Signed)
Patient does not meet retacrit parameters Hgb 10.6 today. Labs printed and given to patient.

## 2022-10-18 ENCOUNTER — Inpatient Hospital Stay: Payer: Medicare (Managed Care)

## 2022-10-20 ENCOUNTER — Encounter: Payer: Self-pay | Admitting: Hematology

## 2022-10-25 ENCOUNTER — Inpatient Hospital Stay: Payer: Medicare (Managed Care)

## 2022-10-25 ENCOUNTER — Other Ambulatory Visit: Payer: Self-pay

## 2022-10-25 ENCOUNTER — Inpatient Hospital Stay: Payer: Medicare (Managed Care) | Attending: Hematology

## 2022-10-25 ENCOUNTER — Encounter: Payer: Self-pay | Admitting: Hematology

## 2022-10-25 ENCOUNTER — Inpatient Hospital Stay: Payer: Medicare (Managed Care) | Admitting: Hematology

## 2022-10-25 VITALS — BP 161/55 | HR 81 | Temp 98.1°F | Resp 15 | Ht 66.0 in | Wt 160.8 lb

## 2022-10-25 VITALS — BP 155/72 | HR 78 | Temp 98.3°F | Resp 18

## 2022-10-25 DIAGNOSIS — N184 Chronic kidney disease, stage 4 (severe): Secondary | ICD-10-CM | POA: Diagnosis present

## 2022-10-25 DIAGNOSIS — D509 Iron deficiency anemia, unspecified: Secondary | ICD-10-CM

## 2022-10-25 DIAGNOSIS — D464 Refractory anemia, unspecified: Secondary | ICD-10-CM

## 2022-10-25 DIAGNOSIS — C50311 Malignant neoplasm of lower-inner quadrant of right female breast: Secondary | ICD-10-CM

## 2022-10-25 DIAGNOSIS — D631 Anemia in chronic kidney disease: Secondary | ICD-10-CM | POA: Insufficient documentation

## 2022-10-25 DIAGNOSIS — Z170 Estrogen receptor positive status [ER+]: Secondary | ICD-10-CM | POA: Diagnosis not present

## 2022-10-25 DIAGNOSIS — Z79899 Other long term (current) drug therapy: Secondary | ICD-10-CM | POA: Insufficient documentation

## 2022-10-25 DIAGNOSIS — D518 Other vitamin B12 deficiency anemias: Secondary | ICD-10-CM | POA: Diagnosis not present

## 2022-10-25 LAB — CBC WITH DIFFERENTIAL (CANCER CENTER ONLY)
Abs Immature Granulocytes: 0.08 10*3/uL — ABNORMAL HIGH (ref 0.00–0.07)
Basophils Absolute: 0 10*3/uL (ref 0.0–0.1)
Basophils Relative: 0 %
Eosinophils Absolute: 0.1 10*3/uL (ref 0.0–0.5)
Eosinophils Relative: 1 %
HCT: 31.6 % — ABNORMAL LOW (ref 36.0–46.0)
Hemoglobin: 9.9 g/dL — ABNORMAL LOW (ref 12.0–15.0)
Immature Granulocytes: 1 %
Lymphocytes Relative: 14 %
Lymphs Abs: 1.4 10*3/uL (ref 0.7–4.0)
MCH: 27.4 pg (ref 26.0–34.0)
MCHC: 31.3 g/dL (ref 30.0–36.0)
MCV: 87.5 fL (ref 80.0–100.0)
Monocytes Absolute: 0.7 10*3/uL (ref 0.1–1.0)
Monocytes Relative: 6 %
Neutro Abs: 8 10*3/uL — ABNORMAL HIGH (ref 1.7–7.7)
Neutrophils Relative %: 78 %
Platelet Count: 236 10*3/uL (ref 150–400)
RBC: 3.61 MIL/uL — ABNORMAL LOW (ref 3.87–5.11)
RDW: 16.6 % — ABNORMAL HIGH (ref 11.5–15.5)
WBC Count: 10.3 10*3/uL (ref 4.0–10.5)
nRBC: 0 % (ref 0.0–0.2)

## 2022-10-25 LAB — CMP (CANCER CENTER ONLY)
ALT: 9 U/L (ref 0–44)
AST: 12 U/L — ABNORMAL LOW (ref 15–41)
Albumin: 3.8 g/dL (ref 3.5–5.0)
Alkaline Phosphatase: 74 U/L (ref 38–126)
Anion gap: 6 (ref 5–15)
BUN: 55 mg/dL — ABNORMAL HIGH (ref 8–23)
CO2: 26 mmol/L (ref 22–32)
Calcium: 9.2 mg/dL (ref 8.9–10.3)
Chloride: 106 mmol/L (ref 98–111)
Creatinine: 2.31 mg/dL — ABNORMAL HIGH (ref 0.44–1.00)
GFR, Estimated: 22 mL/min — ABNORMAL LOW (ref >60)
Glucose, Bld: 117 mg/dL — ABNORMAL HIGH (ref 70–99)
Potassium: 4.7 mmol/L (ref 3.5–5.1)
Sodium: 138 mmol/L (ref 135–145)
Total Bilirubin: 0.3 mg/dL (ref 0.3–1.2)
Total Protein: 7.7 g/dL (ref 6.5–8.1)

## 2022-10-25 LAB — SAMPLE TO BLOOD BANK

## 2022-10-25 MED ORDER — CYANOCOBALAMIN 1000 MCG/ML IJ SOLN: 1000.0000 ug | Freq: Once | INTRAMUSCULAR | Status: DC

## 2022-10-25 MED ORDER — EPOETIN ALFA-EPBX 40000 UNIT/ML IJ SOLN
40000.0000 [IU] | Freq: Once | INTRAMUSCULAR | Status: AC
  Administered 2022-10-25: 40000 [IU] via SUBCUTANEOUS
  Filled 2022-10-25: qty 1

## 2022-10-25 MED ORDER — SODIUM CHLORIDE 0.9 % IV SOLN
300.0000 mg | Freq: Once | INTRAVENOUS | Status: DC
Start: 2022-10-25 — End: 2022-10-25

## 2022-10-25 NOTE — Progress Notes (Signed)
[facility]   [phone] Fax:([phone]) [phone]   Clinic Follow up Note   Patient Care Team: Renford Dills, MD as PCP - General (Internal Medicine) Wendall Stade, MD as PCP - Cardiology (Cardiology) Blair Promise, OD as Consulting Physician (Optometry) Almond Lint, MD as Consulting Physician (General Surgery) Malachy Mood, MD as Consulting Physician (Hematology) Bufford Buttner, MD as Consulting Physician (Nephrology)  Date of Service:  10/25/2022  CHIEF COMPLAINT: f/u of anemia of chronic disease, h/o right breast cancer   CURRENT THERAPY:  -Retacrit injection starting 09/2018, currently q2weeks, hold if Hg>10.5 -B12 injection starting 05/04/19, currently q6weeks -Oral Ferrous sulfate daily  -IV Feraheme as needed starting 09/15/18 for ferritin<100  ASSESSMENT:  [name] is a 77 y.o. female with   Breast cancer of lower-inner quadrant of right female breast (HCC) invasive ductal carcinoma with DCIS, pT1bN0Mo, stage IA,   ER+ and PR +, HER 2 negative -diagnosed 02/2013, s/p right lumpectomy, adjuvant radiation and Anastrozole for 4 years until 07/2017.  Anastrozole was stopped after she developed septic shock from pneumonia and a cardiac arrest, and we decided not restart due to the risk of cardiac toxicity. -Last mammogram on September 04, 2022 was negative.  B12 deficiency anemia Started receiving monthly B12 injections in our clinic on 05/04/19, currently on every 6 weeks    Anemia of chronic kidney failure, stage 4 (severe) (HCC) -Her insurance did not approve Aranesp so she was switched to Retacrit every 2 weeks on 11/14/18. Goal is Hg 9-11. Due to worsened anemia, her dose has been increased, currently at 40,000 every 4 weeks.     Iron deficiency anemia -Probably secondary to GI bleeding, also endoscopy was negative. -Her previous bone marrow biopsy in 2020 was negative. -She has been receiving IV iron as needed for ferritin less than 100.       PLAN: -Lab reviewed, hemoglobin 9.9, will proceed Retacrit injection today and continue every 2 weeks as needed -Will continue B12 injection every 6 weeks  -Will monitor iron and B12 level periodically -Follow-up in 6 months.   SUMMARY OF ONCOLOGIC HISTORY: Oncology History Overview Note  Cancer Staging Breast cancer of lower-inner quadrant of right female breast [facility]) Staging form: Breast, AJCC 7th Edition - Clinical: Stage IA (T1b, N0, cM0) - Unsigned Staging comments: Staged at breast conference 03/25/13.  - Pathologic stage from 05/12/2013: Stage IA (T1b, N0, cM0) - Signed by Malachy Mood, MD on 06/21/2016     Breast cancer of lower-inner quadrant of right female breast (HCC)  02/27/2013 Mammogram   Diagnostic mammogram and ultrasound showed a 6 x 5 mm mass at 5:00 position. Axilla was negative by ultrasound.   03/16/2013 Initial Biopsy   Right breast 5:00 position biopsy showed invasive ductal carcinoma, low-grade   03/16/2013 Receptors her2   ER100% positive, PR 100% positive, HER-2 negative, Ki-67 20%   03/18/2013 Initial Diagnosis   Breast cancer of lower-inner quadrant of right female breast (HCC)   05/12/2013 Surgery   Right breast lumpectomy with sentinel lymph node biopsy   05/12/2013 Pathology Results   Right breast lumpectomy showed invasive ductal carcinoma, grade 2, 0.9 cm, 6 sentinel lymph nodes were all negative. Margins were negative.   05/12/2013 Oncotype testing   Oncotype recurrence score 19, intermediate risk, 10 year risk of distant recurrence 12% with tamoxifen for 5 years. Patient declined adjuvant chemotherapy.   06/02/2013 - 07/16/2013 Radiation Therapy   Adjuvant breast radiation with boost 1600Gy   08/06/2013 - 07/2017  Anti-estrogen oral therapy   Arimidex 1 mg daily. Stopped in 07/2017 due to acute illness    03/22/2017 Mammogram   IMPRESSION: No mammographic evidence for malignancy.   Iron deficiency anemia     INTERVAL HISTORY:  Katie [place]  [name] is here for a follow up of 3 She was last seen by anemia of chronic disease, h/o right breast cancer me on 07/19/2022. She presents to the clinic alone.  She is doing well overall, mild fatigue, but able to function well.  She denies any new pain or other symptoms.  She is tolerating injections well, her last iron was in May 2024, she did not notice much change after that.    All other systems were reviewed with the patient and are negative.  MEDICAL HISTORY:  Past Medical History:  Diagnosis Date   Abdominal discomfort    Chronic N/V/D. Presumptive dx Crohn's dx per elevated p ANCA. Failed Entocort and Pentasa. Sep 2003 - ileocolectomy c anastomosis per Dr Orson Slick 2/2 adhesions - path was hegative for Crohns. EGD, Sm bowel follow through (11/03), and an eteroclysis (10/03) were unrevealing. Cuases hypomag and hypocalcemia.   Adnexal mass 8/03   s/p lap BSO (R ovarian fibroma) & lysis of adhesions   Allergy    Seasonal   Anemia    Multifactorial. Baseline HgB 10-11 ish. B12 def - 150 in 3/10. Fe Def - ferritin 35 3/10. Both are being repleted.   Breast cancer (HCC) 03/16/13   right, 5 o'clock   CAD (coronary artery disease) 1996   1996 - PTCA and angioplasty diagonal branch. 2000 - Rotoblator & angiopllasty of diagonal. 2006 - subendocardial AMI, DES to proximal LAD.Marland Kitchen Also had 90% stenosis in distal apical LAD. EF 55 with apical hypokinesis. Indefinite ASA and Plavix.   CHF (congestive heart failure) (HCC)    Chronic kidney disease    Chronic renal insuff baseline Cr 1.2 - 1.4 ish.   Chronic kidney disease, stage III (moderate) (HCC)    Chronic renal insuff baseline Cr 1.2 - 1.4 ish. New baseline about 2 (2018)   Chronic pain    CT 10/10 = Spinal stenosis L2 - S1.   COVID-19 11/02/2020   Diabetes mellitus    Insulin dependent   Diabetes mellitus type 2 in obese 05/03/2010   Managed on lantus and novolog. Has diabetic nephropathy. Metformin D/C'd 2012 2/2 creatinine.  No diabetic  retinopathy per 3/11.    Gout    Hx of radiation therapy 06/02/13- 07/16/13   right rbeast 4500 cGy 25 sessions, right breast boost 1600 cGy in 8 sessions   Hyperlipidemia    Managed with both a statin and Welchol. Welchol stopped 2014 2/2 cost and started on fenofibrate    Hypertension    2006 B renal arteries patent. 2003 MRA - no RAS. 2003 pheo W/U Dr Caryn Section reportedly negative.   Hypoxia 07/23/2017   Lupus (HCC)    Lymphedema of breast    Personal history of radiation therapy 2015   Pneumonia of left lung due to infectious organism    RBBB    Renal vein thrombosis (HCC)    SBO (small bowel obstruction) (HCC) 09/17/2017   Secondary hyperparathyroidism (HCC)    Stroke [facility])    Incidental finding MRI 2002 L lacunar infarct   Vitamin B12 deficiency    Vitamin D deficiency    Wears dentures    top    SURGICAL HISTORY: Past Surgical History:  Procedure Laterality Date   ABDOMINAL HYSTERECTOMY  BILATERAL SALPINGOOPHORECTOMY  8/03   Lap BSO (R ovarian fibroma) and adhesion lysis   BIOPSY  09/08/2020   Procedure: BIOPSY;  Surgeon: Bernette Redbird, MD;  Location: [facility];  Service: Endoscopy;;   BOWEL RESECTION  2003   ileocolectomy with anastomosis 2/2 adhesions   BREAST BIOPSY Right 2015   BREAST BIOPSY Right 07/2014   BREAST LUMPECTOMY Right 04/22/2013   BREAST LUMPECTOMY WITH NEEDLE LOCALIZATION AND AXILLARY SENTINEL LYMPH NODE BX Right 04/22/2013   Procedure: BREAST LUMPECTOMY WITH NEEDLE LOCALIZATION AND AXILLARY SENTINEL LYMPH NODE BX;  Surgeon: Almond Lint, MD;  Location:  SURGERY CENTER;  Service: General;  Laterality: Right;   CARDIAC CATHETERIZATION     2 stents   CHOLECYSTECTOMY     COLONOSCOPY     ESOPHAGOGASTRODUODENOSCOPY N/A 09/08/2020   Procedure: ESOPHAGOGASTRODUODENOSCOPY (EGD);  Surgeon: Bernette Redbird, MD;  Location: [facility];  Service: Endoscopy;  Laterality: N/A;   HEMICOLECTOMY     R sided hemicolectomy   HERNIA REPAIR     Ventral  hernia repair   PTCA  4/06    I have reviewed the social history and family history with the patient and they are unchanged from previous note.  ALLERGIES:  is allergic to anesthetics, halogenated; etomidate; fentanyl; percocet [oxycodone-acetaminophen]; diazepam; lorazepam; tramadol hcl; haloperidol; morphine sulfate; propoxyphene; and versed [midazolam].  MEDICATIONS:  Current Outpatient Medications  Medication Sig Dispense Refill   acetaminophen (TYLENOL) 500 MG tablet Take 1 tablet (500 mg total) by mouth every 6 (six) hours as needed. (Patient taking differently: Take 500 mg by mouth every 6 (six) hours as needed for mild pain.) 30 tablet 0   amLODipine (NORVASC) 10 MG tablet Take 1 tablet (10 mg total) by mouth daily. 90 tablet 1   apixaban (ELIQUIS) 2.5 MG TABS tablet Take 1 tablet (2.5 mg total) by mouth 2 (two) times daily. 60 tablet 0   budesonide (ENTOCORT EC) 3 MG 24 hr capsule Take 1 capsule (3 mg total) by mouth 3 (three) times daily. 90 capsule 1   colestipol (COLESTID) 1 g tablet Take 2 g by mouth at bedtime.     CREON 36000-114000 units CPEP capsule Take 36,000 Units by mouth 3 (three) times daily with meals.     docusate sodium (COLACE) 100 MG capsule Take 1 capsule (100 mg total) by mouth 2 (two) times daily. 10 capsule 0   famotidine (PEPCID) 20 MG tablet Take 20 mg by mouth daily as needed for heartburn or indigestion.     furosemide (LASIX) 40 MG tablet Take 1 tablet (40 mg total) by mouth daily. (Patient taking differently: Take 40 mg by mouth in the morning.) 90 tablet 3   hydrALAZINE (APRESOLINE) 50 MG tablet Take 1 tablet (50 mg total) by mouth in the morning and at bedtime. 60 tablet 1   mirtazapine (REMERON) 7.5 MG tablet Take 1 tablet (7.5 mg total) by mouth at bedtime. 90 tablet 1   nitroGLYCERIN (NITROSTAT) 0.4 MG SL tablet DISSOLVE 1 TABLET UNDER THE TONGUE EVERY 5 MINUTES AS  NEEDED FOR CHEST PAIN. MAX  OF 3 TABLETS IN 15 MINUTES. CALL 911 IF PAIN PERSISTS.  (Patient taking differently: Place 0.4 mg under the tongue every 5 (five) minutes as needed for chest pain.) 25 tablet 0   pantoprazole (PROTONIX) 40 MG tablet Take 1 tablet (40 mg total) by mouth 2 (two) times daily. 30 tablet 0   tiZANidine (ZANAFLEX) 4 MG capsule Take 1 capsule (4 mg total) by mouth 3 (three) times  daily. 15 capsule 0   No current facility-administered medications for this visit.    PHYSICAL EXAMINATION: ECOG PERFORMANCE STATUS: 1 - Symptomatic but completely ambulatory  Vitals:   10/25/22 1427  BP: (!) 161/55  Pulse: 81  Resp: 15  Temp: 98.1 F (36.7 C)  SpO2: 100%    Wt Readings from Last 3 Encounters:  10/25/22 160 lb 12.8 oz (72.9 kg)  09/22/22 140 lb (63.5 kg)  07/28/22 160 lb 11.5 oz (72.9 kg)      GENERAL:alert, no distress and comfortable SKIN: skin color normal, no rashes or significant lesions EYES: normal, Conjunctiva are pink and non-injected, sclera clear  NEURO: alert & oriented x 3 with fluent speech  LABORATORY DATA:  I have reviewed the data as listed    Latest Ref Rng & Units 10/25/2022    1:55 PM 10/11/2022    2:29 PM 09/27/2022    1:54 PM  CBC  WBC 4.0 - 10.5 K/uL 10.3  8.5  11.6   Hemoglobin 12.0 - 15.0 g/dL 9.9  16.1  09.6   Hematocrit 36.0 - 46.0 % 31.6  34.4  33.7   Platelets 150 - 400 K/uL 236  309  240         Latest Ref Rng & Units 10/25/2022    1:55 PM 09/24/2022   12:19 PM 09/23/2022    2:19 AM  CMP  Glucose 70 - 99 mg/dL 045  409  811   BUN 8 - 23 mg/dL 55  24  31   Creatinine 0.44 - 1.00 mg/dL 9.14  7.82  9.56   Sodium 135 - 145 mmol/L 138  141  144   Potassium 3.5 - 5.1 mmol/L 4.7  3.6  3.6   Chloride 98 - 111 mmol/L 106  111  112   CO2 22 - 32 mmol/L 26  19  22    Calcium 8.9 - 10.3 mg/dL 9.2  9.1  8.8   Total Protein 6.5 - 8.1 g/dL 7.7     Total Bilirubin 0.3 - 1.2 mg/dL 0.3     Alkaline Phos 38 - 126 U/L 74     AST 15 - 41 U/L 12     ALT 0 - 44 U/L 9         RADIOGRAPHIC STUDIES: I have personally  reviewed the radiological images as listed and agreed with the findings in the report. No results found.    No orders of the defined types were placed in this encounter.  All questions were answered. The patient knows to call the clinic with any problems, questions or concerns. No barriers to learning was detected. The total time spent in the appointment was 25 minutes.     Malachy Mood, MD 10/25/2022

## 2022-10-26 ENCOUNTER — Encounter: Payer: Self-pay | Admitting: Hematology

## 2022-10-26 ENCOUNTER — Telehealth: Payer: Self-pay | Admitting: Hematology

## 2022-10-26 ENCOUNTER — Other Ambulatory Visit (HOSPITAL_COMMUNITY): Payer: Self-pay

## 2022-10-26 MED ORDER — AMLODIPINE BESYLATE 10 MG PO TABS
10.0000 mg | ORAL_TABLET | Freq: Every day | ORAL | 3 refills | Status: DC
  Filled 2022-10-26: qty 90, 90d supply, fill #0
  Filled 2023-01-16: qty 90, 90d supply, fill #1
  Filled 2023-04-06: qty 90, 90d supply, fill #2

## 2022-10-27 ENCOUNTER — Other Ambulatory Visit (HOSPITAL_COMMUNITY): Payer: Self-pay

## 2022-10-28 ENCOUNTER — Other Ambulatory Visit: Payer: Self-pay

## 2022-10-28 ENCOUNTER — Other Ambulatory Visit (HOSPITAL_COMMUNITY): Payer: Self-pay

## 2022-10-29 ENCOUNTER — Other Ambulatory Visit: Payer: Self-pay

## 2022-10-31 ENCOUNTER — Encounter: Payer: Self-pay | Admitting: Hematology

## 2022-10-31 ENCOUNTER — Other Ambulatory Visit (HOSPITAL_COMMUNITY): Payer: Self-pay

## 2022-10-31 NOTE — Telephone Encounter (Signed)
Error

## 2022-11-01 ENCOUNTER — Inpatient Hospital Stay: Payer: Medicare (Managed Care) | Admitting: Hematology

## 2022-11-01 ENCOUNTER — Inpatient Hospital Stay: Payer: Medicare (Managed Care)

## 2022-11-08 ENCOUNTER — Inpatient Hospital Stay: Payer: Medicare (Managed Care)

## 2022-11-08 VITALS — BP 161/65 | HR 76 | Temp 98.8°F | Resp 18

## 2022-11-08 DIAGNOSIS — C50311 Malignant neoplasm of lower-inner quadrant of right female breast: Secondary | ICD-10-CM

## 2022-11-08 DIAGNOSIS — E538 Deficiency of other specified B group vitamins: Secondary | ICD-10-CM

## 2022-11-08 DIAGNOSIS — D509 Iron deficiency anemia, unspecified: Secondary | ICD-10-CM

## 2022-11-08 DIAGNOSIS — N184 Chronic kidney disease, stage 4 (severe): Secondary | ICD-10-CM | POA: Diagnosis not present

## 2022-11-08 DIAGNOSIS — D464 Refractory anemia, unspecified: Secondary | ICD-10-CM

## 2022-11-08 DIAGNOSIS — D631 Anemia in chronic kidney disease: Secondary | ICD-10-CM

## 2022-11-08 LAB — CBC WITH DIFFERENTIAL (CANCER CENTER ONLY)
Abs Immature Granulocytes: 0.07 10*3/uL (ref 0.00–0.07)
Basophils Absolute: 0 10*3/uL (ref 0.0–0.1)
Basophils Relative: 0 %
Eosinophils Absolute: 0 10*3/uL (ref 0.0–0.5)
Eosinophils Relative: 0 %
HCT: 32.1 % — ABNORMAL LOW (ref 36.0–46.0)
Hemoglobin: 10 g/dL — ABNORMAL LOW (ref 12.0–15.0)
Immature Granulocytes: 1 %
Lymphocytes Relative: 13 %
Lymphs Abs: 1.2 10*3/uL (ref 0.7–4.0)
MCH: 27.9 pg (ref 26.0–34.0)
MCHC: 31.2 g/dL (ref 30.0–36.0)
MCV: 89.7 fL (ref 80.0–100.0)
Monocytes Absolute: 0.6 10*3/uL (ref 0.1–1.0)
Monocytes Relative: 7 %
Neutro Abs: 7.4 10*3/uL (ref 1.7–7.7)
Neutrophils Relative %: 79 %
Platelet Count: 203 10*3/uL (ref 150–400)
RBC: 3.58 MIL/uL — ABNORMAL LOW (ref 3.87–5.11)
RDW: 17.8 % — ABNORMAL HIGH (ref 11.5–15.5)
WBC Count: 9.4 10*3/uL (ref 4.0–10.5)
nRBC: 0 % (ref 0.0–0.2)

## 2022-11-08 LAB — SAMPLE TO BLOOD BANK

## 2022-11-08 LAB — VITAMIN B12: Vitamin B-12: 532 pg/mL (ref 180–914)

## 2022-11-08 MED ORDER — EPOETIN ALFA-EPBX 40000 UNIT/ML IJ SOLN
40000.0000 [IU] | Freq: Once | INTRAMUSCULAR | Status: AC
  Administered 2022-11-08: 40000 [IU] via SUBCUTANEOUS
  Filled 2022-11-08: qty 1

## 2022-11-08 MED ORDER — CYANOCOBALAMIN 1000 MCG/ML IJ SOLN
1000.0000 ug | Freq: Once | INTRAMUSCULAR | Status: AC
  Administered 2022-11-08: 1000 ug via INTRAMUSCULAR
  Filled 2022-11-08: qty 1

## 2022-11-16 DIAGNOSIS — E785 Hyperlipidemia, unspecified: Secondary | ICD-10-CM | POA: Diagnosis not present

## 2022-11-16 DIAGNOSIS — E1169 Type 2 diabetes mellitus with other specified complication: Secondary | ICD-10-CM | POA: Diagnosis not present

## 2022-11-16 DIAGNOSIS — N185 Chronic kidney disease, stage 5: Secondary | ICD-10-CM | POA: Diagnosis not present

## 2022-11-16 DIAGNOSIS — N2581 Secondary hyperparathyroidism of renal origin: Secondary | ICD-10-CM | POA: Diagnosis not present

## 2022-11-16 DIAGNOSIS — I5032 Chronic diastolic (congestive) heart failure: Secondary | ICD-10-CM | POA: Diagnosis not present

## 2022-11-16 DIAGNOSIS — I120 Hypertensive chronic kidney disease with stage 5 chronic kidney disease or end stage renal disease: Secondary | ICD-10-CM | POA: Diagnosis not present

## 2022-11-16 DIAGNOSIS — E1122 Type 2 diabetes mellitus with diabetic chronic kidney disease: Secondary | ICD-10-CM | POA: Diagnosis not present

## 2022-11-22 ENCOUNTER — Other Ambulatory Visit: Payer: Self-pay

## 2022-11-22 ENCOUNTER — Inpatient Hospital Stay: Payer: Medicare HMO | Attending: Hematology

## 2022-11-22 ENCOUNTER — Inpatient Hospital Stay: Payer: Medicare HMO

## 2022-11-22 ENCOUNTER — Encounter: Payer: Self-pay | Admitting: Hematology

## 2022-11-22 VITALS — BP 123/73 | HR 74 | Temp 98.2°F | Resp 16

## 2022-11-22 DIAGNOSIS — D509 Iron deficiency anemia, unspecified: Secondary | ICD-10-CM

## 2022-11-22 DIAGNOSIS — D631 Anemia in chronic kidney disease: Secondary | ICD-10-CM | POA: Insufficient documentation

## 2022-11-22 DIAGNOSIS — N184 Chronic kidney disease, stage 4 (severe): Secondary | ICD-10-CM | POA: Insufficient documentation

## 2022-11-22 DIAGNOSIS — D464 Refractory anemia, unspecified: Secondary | ICD-10-CM

## 2022-11-22 LAB — CBC WITH DIFFERENTIAL (CANCER CENTER ONLY)
Abs Immature Granulocytes: 0.08 10*3/uL — ABNORMAL HIGH (ref 0.00–0.07)
Basophils Absolute: 0 10*3/uL (ref 0.0–0.1)
Basophils Relative: 0 %
Eosinophils Absolute: 0.1 10*3/uL (ref 0.0–0.5)
Eosinophils Relative: 1 %
HCT: 32.3 % — ABNORMAL LOW (ref 36.0–46.0)
Hemoglobin: 10.1 g/dL — ABNORMAL LOW (ref 12.0–15.0)
Immature Granulocytes: 1 %
Lymphocytes Relative: 15 %
Lymphs Abs: 1.3 10*3/uL (ref 0.7–4.0)
MCH: 28 pg (ref 26.0–34.0)
MCHC: 31.3 g/dL (ref 30.0–36.0)
MCV: 89.5 fL (ref 80.0–100.0)
Monocytes Absolute: 0.7 10*3/uL (ref 0.1–1.0)
Monocytes Relative: 7 %
Neutro Abs: 6.6 10*3/uL (ref 1.7–7.7)
Neutrophils Relative %: 76 %
Platelet Count: 245 10*3/uL (ref 150–400)
RBC: 3.61 MIL/uL — ABNORMAL LOW (ref 3.87–5.11)
RDW: 17.9 % — ABNORMAL HIGH (ref 11.5–15.5)
WBC Count: 8.8 10*3/uL (ref 4.0–10.5)
nRBC: 0 % (ref 0.0–0.2)

## 2022-11-22 LAB — SAMPLE TO BLOOD BANK

## 2022-11-22 MED ORDER — EPOETIN ALFA-EPBX 40000 UNIT/ML IJ SOLN
40000.0000 [IU] | Freq: Once | INTRAMUSCULAR | Status: AC
  Administered 2022-11-22: 40000 [IU] via SUBCUTANEOUS
  Filled 2022-11-22: qty 1

## 2022-11-26 ENCOUNTER — Emergency Department (HOSPITAL_BASED_OUTPATIENT_CLINIC_OR_DEPARTMENT_OTHER)
Admission: EM | Admit: 2022-11-26 | Discharge: 2022-11-26 | Disposition: A | Discharge status: Home / Self Care | Payer: Commercial Managed Care - HMO

## 2022-11-26 ENCOUNTER — Other Ambulatory Visit: Payer: Self-pay

## 2022-11-26 DIAGNOSIS — M5459 Other low back pain: Secondary | ICD-10-CM | POA: Diagnosis not present

## 2022-11-26 DIAGNOSIS — I2510 Atherosclerotic heart disease of native coronary artery without angina pectoris: Secondary | ICD-10-CM | POA: Diagnosis not present

## 2022-11-26 DIAGNOSIS — Z7901 Long term (current) use of anticoagulants: Secondary | ICD-10-CM | POA: Diagnosis not present

## 2022-11-26 DIAGNOSIS — I509 Heart failure, unspecified: Secondary | ICD-10-CM | POA: Insufficient documentation

## 2022-11-26 DIAGNOSIS — E1122 Type 2 diabetes mellitus with diabetic chronic kidney disease: Secondary | ICD-10-CM | POA: Insufficient documentation

## 2022-11-26 DIAGNOSIS — N189 Chronic kidney disease, unspecified: Secondary | ICD-10-CM | POA: Diagnosis not present

## 2022-11-26 DIAGNOSIS — M545 Low back pain, unspecified: Secondary | ICD-10-CM | POA: Diagnosis not present

## 2022-11-26 DIAGNOSIS — Z853 Personal history of malignant neoplasm of breast: Secondary | ICD-10-CM | POA: Insufficient documentation

## 2022-11-26 DIAGNOSIS — I130 Hypertensive heart and chronic kidney disease with heart failure and stage 1 through stage 4 chronic kidney disease, or unspecified chronic kidney disease: Secondary | ICD-10-CM | POA: Insufficient documentation

## 2022-11-26 DIAGNOSIS — Z79899 Other long term (current) drug therapy: Secondary | ICD-10-CM | POA: Diagnosis not present

## 2022-11-26 MED ORDER — LIDOCAINE 5 % EX PTCH
1.0000 | MEDICATED_PATCH | Freq: Once | CUTANEOUS | Status: DC
  Administered 2022-11-26: 1 via TRANSDERMAL
  Filled 2022-11-26: qty 1

## 2022-11-26 MED ORDER — METHOCARBAMOL 500 MG PO TABS: 500.0000 mg | ORAL_TABLET | Freq: Two times a day (BID) | ORAL | 0 refills | Status: DC | PRN

## 2022-11-26 NOTE — ED Triage Notes (Signed)
Pt via pov from home with back pain that started on Friday, got somewhat better over the weekend, but came back last night. Pt points to lumbar region in center and left side. Denies any urinary symptoms, including hematuria, as well as loss of bladder or bowel control. Pt denies any recent injury. A&O x 4; nad noted.

## 2022-11-26 NOTE — Discharge Instructions (Signed)
You have been seen today for your complaint of back pain. Your discharge medications include tylenol. Take up to 650 mg every 6 hours.  Robaxin. This is a muscle relaxer. It may cause drowsiness. Do not drive, operate heavy machinery or make important decisions when taking this medication. Only take it at night until you know how it affects you. Only take it as needed and take other medications such as ibuprofen or tylenol prior to trying this medication.  . Home care instructions are as follows:  Use a walker or cane until your symptoms improve Follow up with: your PCP within the next 2 to 3 days Please seek immediate medical care if you develop any of the following symptoms: You develop new bowel or bladder control problems. You have unusual weakness or numbness in your arms or legs. You feel faint. At this time there does not appear to be the presence of an emergent medical condition, however there is always the potential for conditions to change. Please read and follow the below instructions.  Do not take your medicine if  develop an itchy rash, swelling in your mouth or lips, or difficulty breathing; call 911 and seek immediate emergency medical attention if this occurs.  You may review your lab tests and imaging results in their entirety on your MyChart account.  Please discuss all results of fully with your primary care provider and other specialist at your follow-up visit.  Note: Portions of this text may have been transcribed using voice recognition software. Every effort was made to ensure accuracy; however, inadvertent computerized transcription errors may still be present.

## 2022-11-26 NOTE — ED Notes (Signed)
Discharge instructions, prescriptions, pain management and follow up care reviewed and explained, pt verbalized understanding and had no further questions on d/c.

## 2022-11-26 NOTE — ED Provider Notes (Signed)
[place] [facility]GENCY DEPARTMENT AT [facility] Provider Note   CSN: [id number] Arrival date & time: 11/26/22  1256     History  Chief Complaint  Patient presents with   Back Pain    [name] is a 77 y.o. female.  With history of hypertension, chronic pain, CHF, CAD, CKD, gout, type 2 diabetes, B12 deficiency, Malignant neoplasm of the right breast who presents to the ED for evaluation of low back pain.  Symptoms began 4 days ago.  She states her pain improved over the last 2 days but got significantly worse overnight.  It is localized to the low back.  Does not radiate.  No saddle paresthesias, numbness, weakness, tingling, urinary or fecal incontinence, fevers or chills, history of injection drug use.  Typically does not need to use any walking assistive devices but has needed to today due to the pain.  She has been taking 650 mg of Tylenol with minimal improvement.  Pain is worsened with activity and moderately improved with rest.  She denies any falls or trauma.  Describes the pain as a spasm.  Denies any rashes or skin changes.   Back Pain      Home Medications Prior to Admission medications   Medication Sig Start Date End Date Taking? Authorizing Provider  methocarbamol (ROBAXIN) 500 MG tablet Take 1 tablet (500 mg total) by mouth 2 (two) times daily as needed for muscle spasms. 11/26/22  Yes Daxx Tiggs, Edsel Petrin, PA-C  acetaminophen (TYLENOL) 500 MG tablet Take 1 tablet (500 mg total) by mouth every 6 (six) hours as needed. Patient taking differently: Take 500 mg by mouth every 6 (six) hours as needed for mild pain. 05/11/22   Derwood Kaplan, MD  amLODipine (NORVASC) 10 MG tablet Take 1 tablet (10 mg total) by mouth daily. 10/26/22     apixaban (ELIQUIS) 2.5 MG TABS tablet Take 1 tablet (2.5 mg total) by mouth 2 (two) times daily. 10/30/21   Arrien, York Ram, MD  colestipol (COLESTID) 1 g tablet Take 2 g by mouth at bedtime. 07/06/22   [provider]   CREON 36000-114000 units CPEP capsule Take 36,000 Units by mouth 3 (three) times daily with meals. 06/05/22   [provider]  docusate sodium (COLACE) 100 MG capsule Take 1 capsule (100 mg total) by mouth 2 (two) times daily. 09/25/22   Barnetta Chapel, MD  famotidine (PEPCID) 20 MG tablet Take 20 mg by mouth daily as needed for heartburn or indigestion. 10/14/21   [provider]  furosemide (LASIX) 40 MG tablet Take 1 tablet (40 mg total) by mouth daily. Patient taking differently: Take 40 mg by mouth in the morning. 03/28/22     hydrALAZINE (APRESOLINE) 50 MG tablet Take 1 tablet (50 mg total) by mouth in the morning and at bedtime. 09/25/22 10/25/22  Berton Mount I, MD  mirtazapine (REMERON) 7.5 MG tablet Take 1 tablet (7.5 mg total) by mouth at bedtime. 12/22/20   Malachy Mood, MD  nitroGLYCERIN (NITROSTAT) 0.4 MG SL tablet DISSOLVE 1 TABLET UNDER THE TONGUE EVERY 5 MINUTES AS  NEEDED FOR CHEST PAIN. MAX  OF 3 TABLETS IN 15 MINUTES. CALL 911 IF PAIN PERSISTS. Patient taking differently: Place 0.4 mg under the tongue every 5 (five) minutes as needed for chest pain. 10/04/20   Jake Bathe, MD  pantoprazole (PROTONIX) 40 MG tablet Take 1 tablet (40 mg total) by mouth 2 (two) times daily. 12/23/21   Jacalyn Lefevre, MD  tiZANidine (ZANAFLEX)  4 MG capsule Take 1 capsule (4 mg total) by mouth 3 (three) times daily. 07/29/22   Hughie Closs, MD      Allergies    Anesthetics, halogenated; Etomidate; Fentanyl; Percocet [oxycodone-acetaminophen]; Diazepam; Lorazepam; Tramadol hcl; Haloperidol; Morphine sulfate; Propoxyphene; and Versed [midazolam]    Review of Systems   Review of Systems  Musculoskeletal:  Positive for back pain.  All other systems reviewed and are negative.   Physical Exam Updated Vital Signs BP (!) 155/60 (BP Location: Left Arm)   Pulse 76   Temp 98.7 F (37.1 C) (Oral)   Resp 18   Ht 5\' 6"  (1.676 m)   Wt 72.9 kg   SpO2 100%   BMI 25.94 kg/m  Physical  Exam Vitals and nursing note reviewed.  Constitutional:      General: She is not in acute distress.    Appearance: Normal appearance. She is normal weight. She is not ill-appearing.     Comments: Sitting upright in chair  HENT:     Head: Normocephalic and atraumatic.  Pulmonary:     Effort: Pulmonary effort is normal. No respiratory distress.  Abdominal:     General: Abdomen is flat.  Musculoskeletal:        General: Normal range of motion.     Cervical back: Neck supple.     Comments: Normal flexion of bilateral hips.  Normal flexion extension of bilateral knees.  Normal dorsiflexion and plantarflexion.  Sensation intact in all digits.  Significant TTP to the bilateral lumbar region.  No specific midline TTP, step-offs, deformities  Skin:    General: Skin is warm and dry.  Neurological:     General: No focal deficit present.     Mental Status: She is alert and oriented to person, place, and time.  Psychiatric:        Mood and Affect: Mood normal.        Behavior: Behavior normal.     ED Results / Procedures / Treatments   Labs (all labs ordered are listed, but only abnormal results are displayed) Labs Reviewed - No data to display  EKG None  Radiology No results found.  Procedures Procedures    Medications Ordered in ED Medications  lidocaine (LIDODERM) 5 % 1 patch (1 patch Transdermal Patch Applied 11/26/22 1625)    ED Course/ Medical Decision Making/ A&P                                 Medical Decision Making  This patient presents to the ED for concern of back pain, this involves an extensive number of treatment options, and is a complaint that carries with it a high risk of complications and morbidity.  The emergent differential diagnosis for back pain includes but is not limited to fracture, muscle strain, cauda equina, spinal stenosis. DDD, ankylosing spondylitis, acute ligamentous injury, disk herniation, spondylolisthesis, Epidural compression syndrome,  metastatic cancer, transverse myelitis, vertebral osteomyelitis, diskitis, kidney stone, pyelonephritis, AAA, Perforated ulcer, Retrocecal appendicitis, pancreatitis, bowel obstruction, retroperitoneal hemorrhage or mass, meningitis.   Co morbidities that complicate the patient evaluation   hypertension, chronic pain, CHF, CAD, CKD, gout, type 2 diabetes, B12 deficiency, Malignant neoplasm of the right breast  Additional history obtained from: Nursing notes from this visit. Family daughter is at bedside and provides a portion of the history  Afebrile, hemodynamically stable.  77 year old female presenting to the ED for evaluation of low back pain.  This is bilateral and localized to the musculature.  She denies any trauma.  She has had some difficulty walking secondary to the pain and has to walk leaning forward.  She does have assistive walking devices at home that she will use.  She also lives with her son who is her primary caretaker.  She has been using Tylenol with minimal improvement in her symptoms.  She was encouraged to continue taking Tylenol.  She was sent a short course of Robaxin and thoroughly educated on potential side effects including increased risk of falls.  She was also encouraged to use lidocaine patches as needed.  She states she will be able to follow-up with her primary care provider within the next 2 to 3 days.  Overall strongly suspect this is a musculoskeletal injury.  Imaging was offered, however do not believe this would be beneficial.  She is without red flag symptoms of cauda equina.  She was given strict return precautions.  Stable for discharge.  At this time there does not appear to be any evidence of an acute emergency medical condition and the patient appears stable for discharge with appropriate outpatient follow up. Diagnosis was discussed with patient who verbalizes understanding of care plan and is agreeable to discharge. I have discussed return precautions with  patient and daughter who verbalizes understanding. Patient encouraged to follow-up with their PCP within 2 to 3 days. All questions answered.  Note: Portions of this report may have been transcribed using voice recognition software. Every effort was made to ensure accuracy; however, inadvertent computerized transcription errors may still be present.        Final Clinical Impression(s) / ED Diagnoses Final diagnoses:  Acute bilateral low back pain without sciatica    Rx / DC Orders ED Discharge Orders          Ordered    methocarbamol (ROBAXIN) 500 MG tablet  2 times daily PRN        11/26/22 1613              Michelle Piper, PA-C 11/26/22 1639    Coral Spikes, DO 11/26/22 2334

## 2022-11-28 DIAGNOSIS — I272 Pulmonary hypertension, unspecified: Secondary | ICD-10-CM | POA: Diagnosis not present

## 2022-11-28 DIAGNOSIS — M545 Low back pain, unspecified: Secondary | ICD-10-CM | POA: Diagnosis not present

## 2022-11-28 DIAGNOSIS — Z23 Encounter for immunization: Secondary | ICD-10-CM | POA: Diagnosis not present

## 2022-11-28 DIAGNOSIS — N2581 Secondary hyperparathyroidism of renal origin: Secondary | ICD-10-CM | POA: Diagnosis not present

## 2022-11-29 ENCOUNTER — Other Ambulatory Visit: Payer: Self-pay | Admitting: Internal Medicine

## 2022-11-29 ENCOUNTER — Ambulatory Visit
Admission: RE | Admit: 2022-11-29 | Discharge: 2022-11-29 | Disposition: A | Discharge status: Home / Self Care | Source: Ambulatory Visit | Attending: Internal Medicine | Admitting: Internal Medicine

## 2022-11-29 DIAGNOSIS — M545 Low back pain, unspecified: Secondary | ICD-10-CM

## 2022-12-02 ENCOUNTER — Encounter: Payer: Self-pay | Admitting: Hematology

## 2022-12-06 ENCOUNTER — Inpatient Hospital Stay: Payer: Medicare HMO

## 2022-12-06 VITALS — BP 161/66 | HR 83 | Temp 98.4°F | Resp 18

## 2022-12-06 DIAGNOSIS — D509 Iron deficiency anemia, unspecified: Secondary | ICD-10-CM

## 2022-12-06 DIAGNOSIS — D631 Anemia in chronic kidney disease: Secondary | ICD-10-CM | POA: Diagnosis not present

## 2022-12-06 DIAGNOSIS — D464 Refractory anemia, unspecified: Secondary | ICD-10-CM

## 2022-12-06 DIAGNOSIS — N184 Chronic kidney disease, stage 4 (severe): Secondary | ICD-10-CM | POA: Diagnosis not present

## 2022-12-06 DIAGNOSIS — E538 Deficiency of other specified B group vitamins: Secondary | ICD-10-CM

## 2022-12-06 DIAGNOSIS — C50311 Malignant neoplasm of lower-inner quadrant of right female breast: Secondary | ICD-10-CM

## 2022-12-06 LAB — CBC WITH DIFFERENTIAL (CANCER CENTER ONLY)
Abs Immature Granulocytes: 0.83 10*3/uL — ABNORMAL HIGH (ref 0.00–0.07)
Basophils Absolute: 0.1 10*3/uL (ref 0.0–0.1)
Basophils Relative: 1 %
Eosinophils Absolute: 0.1 10*3/uL (ref 0.0–0.5)
Eosinophils Relative: 0 %
HCT: 31.7 % — ABNORMAL LOW (ref 36.0–46.0)
Hemoglobin: 10.4 g/dL — ABNORMAL LOW (ref 12.0–15.0)
Immature Granulocytes: 6 %
Lymphocytes Relative: 8 %
Lymphs Abs: 1.2 10*3/uL (ref 0.7–4.0)
MCH: 28.8 pg (ref 26.0–34.0)
MCHC: 32.8 g/dL (ref 30.0–36.0)
MCV: 87.8 fL (ref 80.0–100.0)
Monocytes Absolute: 1 10*3/uL (ref 0.1–1.0)
Monocytes Relative: 6 %
Neutro Abs: 11.8 10*3/uL — ABNORMAL HIGH (ref 1.7–7.7)
Neutrophils Relative %: 79 %
Platelet Count: 272 10*3/uL (ref 150–400)
RBC: 3.61 MIL/uL — ABNORMAL LOW (ref 3.87–5.11)
RDW: 16.8 % — ABNORMAL HIGH (ref 11.5–15.5)
Smear Review: NORMAL
WBC Count: 14.9 10*3/uL — ABNORMAL HIGH (ref 4.0–10.5)
nRBC: 0 % (ref 0.0–0.2)

## 2022-12-06 LAB — SAMPLE TO BLOOD BANK

## 2022-12-06 LAB — VITAMIN B12: Vitamin B-12: 624 pg/mL (ref 180–914)

## 2022-12-06 MED ORDER — EPOETIN ALFA-EPBX 40000 UNIT/ML IJ SOLN
40000.0000 [IU] | Freq: Once | INTRAMUSCULAR | Status: AC
  Administered 2022-12-06: 40000 [IU] via SUBCUTANEOUS
  Filled 2022-12-06: qty 1

## 2022-12-08 ENCOUNTER — Other Ambulatory Visit (HOSPITAL_COMMUNITY): Payer: Self-pay

## 2022-12-09 DIAGNOSIS — M545 Low back pain, unspecified: Secondary | ICD-10-CM | POA: Diagnosis not present

## 2022-12-10 DIAGNOSIS — M5416 Radiculopathy, lumbar region: Secondary | ICD-10-CM | POA: Diagnosis not present

## 2022-12-11 ENCOUNTER — Encounter: Payer: Self-pay | Admitting: Hematology

## 2022-12-15 ENCOUNTER — Emergency Department (HOSPITAL_COMMUNITY)
Admission: EM | Admit: 2022-12-15 | Discharge: 2022-12-15 | Disposition: A | Discharge status: Home / Self Care | Payer: Medicare HMO | Attending: Emergency Medicine | Admitting: Emergency Medicine

## 2022-12-15 ENCOUNTER — Emergency Department (HOSPITAL_COMMUNITY): Payer: Medicare HMO

## 2022-12-15 ENCOUNTER — Other Ambulatory Visit: Payer: Self-pay

## 2022-12-15 ENCOUNTER — Encounter: Payer: Self-pay | Admitting: Hematology

## 2022-12-15 DIAGNOSIS — R0602 Shortness of breath: Secondary | ICD-10-CM | POA: Diagnosis not present

## 2022-12-15 DIAGNOSIS — R600 Localized edema: Secondary | ICD-10-CM | POA: Diagnosis not present

## 2022-12-15 DIAGNOSIS — I10 Essential (primary) hypertension: Secondary | ICD-10-CM | POA: Diagnosis not present

## 2022-12-15 DIAGNOSIS — M7989 Other specified soft tissue disorders: Secondary | ICD-10-CM | POA: Diagnosis not present

## 2022-12-15 DIAGNOSIS — R630 Anorexia: Secondary | ICD-10-CM | POA: Insufficient documentation

## 2022-12-15 DIAGNOSIS — I159 Secondary hypertension, unspecified: Secondary | ICD-10-CM

## 2022-12-15 DIAGNOSIS — Z7901 Long term (current) use of anticoagulants: Secondary | ICD-10-CM | POA: Diagnosis not present

## 2022-12-15 DIAGNOSIS — I509 Heart failure, unspecified: Secondary | ICD-10-CM | POA: Insufficient documentation

## 2022-12-15 DIAGNOSIS — R5383 Other fatigue: Secondary | ICD-10-CM | POA: Diagnosis not present

## 2022-12-15 DIAGNOSIS — N184 Chronic kidney disease, stage 4 (severe): Secondary | ICD-10-CM | POA: Diagnosis not present

## 2022-12-15 DIAGNOSIS — D631 Anemia in chronic kidney disease: Secondary | ICD-10-CM | POA: Diagnosis not present

## 2022-12-15 LAB — CBC
HCT: 34.7 % — ABNORMAL LOW (ref 36.0–46.0)
Hemoglobin: 10.6 g/dL — ABNORMAL LOW (ref 12.0–15.0)
MCH: 27.7 pg (ref 26.0–34.0)
MCHC: 30.5 g/dL (ref 30.0–36.0)
MCV: 90.6 fL (ref 80.0–100.0)
Platelets: 243 10*3/uL (ref 150–400)
RBC: 3.83 MIL/uL — ABNORMAL LOW (ref 3.87–5.11)
RDW: 18 % — ABNORMAL HIGH (ref 11.5–15.5)
WBC: 13 10*3/uL — ABNORMAL HIGH (ref 4.0–10.5)
nRBC: 0.2 % (ref 0.0–0.2)

## 2022-12-15 LAB — BASIC METABOLIC PANEL
Anion gap: 10 (ref 5–15)
BUN: 58 mg/dL — ABNORMAL HIGH (ref 8–23)
CO2: 19 mmol/L — ABNORMAL LOW (ref 22–32)
Calcium: 9.1 mg/dL (ref 8.9–10.3)
Chloride: 109 mmol/L (ref 98–111)
Creatinine, Ser: 2.44 mg/dL — ABNORMAL HIGH (ref 0.44–1.00)
GFR, Estimated: 20 mL/min — ABNORMAL LOW (ref >60)
Glucose, Bld: 163 mg/dL — ABNORMAL HIGH (ref 70–99)
Potassium: 4.6 mmol/L (ref 3.5–5.1)
Sodium: 138 mmol/L (ref 135–145)

## 2022-12-15 LAB — URINALYSIS, ROUTINE W REFLEX MICROSCOPIC
Bilirubin Urine: NEGATIVE
Glucose, UA: NEGATIVE mg/dL
Hgb urine dipstick: NEGATIVE
Ketones, ur: NEGATIVE mg/dL
Leukocytes,Ua: NEGATIVE
Nitrite: NEGATIVE
Protein, ur: 100 mg/dL — AB
Specific Gravity, Urine: 1.01 (ref 1.005–1.030)
pH: 5 (ref 5.0–8.0)

## 2022-12-15 LAB — TROPONIN I (HIGH SENSITIVITY)
Troponin I (High Sensitivity): 12 ng/L (ref <18)
Troponin I (High Sensitivity): 12 ng/L (ref <18)

## 2022-12-15 LAB — BRAIN NATRIURETIC PEPTIDE: B Natriuretic Peptide: 182.7 pg/mL — ABNORMAL HIGH (ref 0.0–100.0)

## 2022-12-15 MED ORDER — FUROSEMIDE 10 MG/ML IJ SOLN
20.0000 mg | Freq: Once | INTRAMUSCULAR | Status: AC
  Administered 2022-12-15: 20 mg via INTRAVENOUS
  Filled 2022-12-15: qty 4

## 2022-12-15 MED ORDER — HYDRALAZINE HCL 25 MG PO TABS
25.0000 mg | ORAL_TABLET | Freq: Once | ORAL | Status: AC
  Administered 2022-12-15: 25 mg via ORAL
  Filled 2022-12-15: qty 1

## 2022-12-15 MED ORDER — HYDRALAZINE HCL 10 MG PO TABS
10.0000 mg | ORAL_TABLET | Freq: Once | ORAL | Status: AC
  Administered 2022-12-15: 10 mg via ORAL
  Filled 2022-12-15: qty 1

## 2022-12-15 MED ORDER — HYDRALAZINE HCL 50 MG PO TABS
50.0000 mg | ORAL_TABLET | Freq: Two times a day (BID) | ORAL | 1 refills | Status: DC
  Filled 2022-12-15 – 2023-01-18 (×2): qty 60, 30d supply, fill #0
  Filled 2023-02-15 – 2023-02-27 (×2): qty 60, 30d supply, fill #1

## 2022-12-15 NOTE — Discharge Instructions (Addendum)
You have been seen and discharged from the emergency department.  Your heart workup was normal.  Your blood pressure improved with a dose of hydralazine.  I have represcribed your hydralazine prescription for 50 mg to be taken twice a day in the morning and at night.  Take this in addition to the Norvasc and follow-up with your primary doctor for repeat blood pressure readings.  Follow-up with your primary provider for further evaluation and further care. Take home medications as prescribed. If you have any worsening symptoms or further concerns for your health please return to an emergency department for further evaluation.

## 2022-12-15 NOTE — ED Provider Notes (Signed)
[place] EMERGENCY DEPARTMENT AT [facility] Provider Note   CSN: [id number] Arrival date & time: 12/15/22  1428     History  Chief Complaint  Patient presents with   Leg Swelling   Shortness of Breath    [name] is a 77 y.o. female.  HPI   77 year old female with past medical history of CHF and chronic leg edema presents emergency department with concern for worsening leg swelling.  Patient states that she has been feeling fatigued and slightly short of breath over the past couple days and noted that her swelling in her legs has increased.  She typically takes Lasix daily and has tried to double her dose last couple days without significant improvement.  Denies any sharp pain, cough, fever.  Mainly here to make sure that her kidney function has not drastically changed.  States she has had a decrease in appetite but is otherwise been well.  Home Medications Prior to Admission medications   Medication Sig Start Date End Date Taking? Authorizing Provider  acetaminophen (TYLENOL) 500 MG tablet Take 1 tablet (500 mg total) by mouth every 6 (six) hours as needed. Patient taking differently: Take 500 mg by mouth every 6 (six) hours as needed for mild pain. 05/11/22   Derwood Kaplan, MD  amLODipine (NORVASC) 10 MG tablet Take 1 tablet (10 mg total) by mouth daily. 10/26/22     apixaban (ELIQUIS) 2.5 MG TABS tablet Take 1 tablet (2.5 mg total) by mouth 2 (two) times daily. 10/30/21   Arrien, York Ram, MD  colestipol (COLESTID) 1 g tablet Take 2 g by mouth at bedtime. 07/06/22   [provider]  CREON 36000-114000 units CPEP capsule Take 36,000 Units by mouth 3 (three) times daily with meals. 06/05/22   [provider]  docusate sodium (COLACE) 100 MG capsule Take 1 capsule (100 mg total) by mouth 2 (two) times daily. 09/25/22   Barnetta Chapel, MD  famotidine (PEPCID) 20 MG tablet Take 20 mg by mouth daily as needed for heartburn or indigestion.  10/14/21   [provider]  furosemide (LASIX) 40 MG tablet Take 1 tablet (40 mg total) by mouth daily. Patient taking differently: Take 40 mg by mouth in the morning. 03/28/22     hydrALAZINE (APRESOLINE) 50 MG tablet Take 1 tablet (50 mg total) by mouth in the morning and at bedtime. 09/25/22 10/25/22  Barnetta Chapel, MD  methocarbamol (ROBAXIN) 500 MG tablet Take 1 tablet (500 mg total) by mouth 2 (two) times daily as needed for muscle spasms. 11/26/22   Schutt, Edsel Petrin, PA-C  mirtazapine (REMERON) 7.5 MG tablet Take 1 tablet (7.5 mg total) by mouth at bedtime. 12/22/20   Malachy Mood, MD  nitroGLYCERIN (NITROSTAT) 0.4 MG SL tablet DISSOLVE 1 TABLET UNDER THE TONGUE EVERY 5 MINUTES AS  NEEDED FOR CHEST PAIN. MAX  OF 3 TABLETS IN 15 MINUTES. CALL 911 IF PAIN PERSISTS. Patient taking differently: Place 0.4 mg under the tongue every 5 (five) minutes as needed for chest pain. 10/04/20   Jake Bathe, MD  pantoprazole (PROTONIX) 40 MG tablet Take 1 tablet (40 mg total) by mouth 2 (two) times daily. 12/23/21   Jacalyn Lefevre, MD  tiZANidine (ZANAFLEX) 4 MG capsule Take 1 capsule (4 mg total) by mouth 3 (three) times daily. 07/29/22   Hughie Closs, MD      Allergies    Anesthetics, halogenated; Etomidate; Fentanyl; Percocet [oxycodone-acetaminophen]; Diazepam; Lorazepam; Tramadol hcl; Haloperidol; Morphine sulfate; Propoxyphene;  and Versed [midazolam]    Review of Systems   Review of Systems  Constitutional:  Positive for appetite change and fatigue. Negative for fever.  Respiratory:  Positive for shortness of breath.   Cardiovascular:  Positive for leg swelling. Negative for chest pain.  Gastrointestinal:  Negative for abdominal pain, diarrhea, nausea and vomiting.  Genitourinary:  Negative for difficulty urinating, dysuria and flank pain.  Skin:  Negative for rash.  Neurological:  Negative for headaches.    Physical Exam Updated Vital Signs BP (!) 162/78 (BP Location: Left Arm)    Pulse 87   Temp 98.6 F (37 C) (Oral)   Resp 16   Ht 5\' 6"  (1.676 m)   Wt 78.9 kg   SpO2 96%   BMI 28.08 kg/m  Physical Exam Vitals and nursing note reviewed.  Constitutional:      General: She is not in acute distress.    Appearance: Normal appearance.  HENT:     Head: Normocephalic.     Mouth/Throat:     Mouth: Mucous membranes are moist.  Cardiovascular:     Rate and Rhythm: Normal rate.  Pulmonary:     Effort: Pulmonary effort is normal. No tachypnea or respiratory distress.     Breath sounds: Examination of the right-lower field reveals decreased breath sounds. Examination of the left-lower field reveals decreased breath sounds. Decreased breath sounds present. No wheezing or rales.  Abdominal:     Palpations: Abdomen is soft.     Tenderness: There is no abdominal tenderness.  Musculoskeletal:     Right lower leg: Edema present.     Left lower leg: Edema present.     Comments: 2+ edema b/l without redness or warmth  Skin:    General: Skin is warm.  Neurological:     Mental Status: She is alert and oriented to person, place, and time. Mental status is at baseline.  Psychiatric:        Mood and Affect: Mood normal.     ED Results / Procedures / Treatments   Labs (all labs ordered are listed, but only abnormal results are displayed) Labs Reviewed  BASIC METABOLIC PANEL  CBC  BRAIN NATRIURETIC PEPTIDE  TROPONIN I (HIGH SENSITIVITY)    EKG None  Radiology DG Chest 2 View  Result Date: 12/15/2022 CLINICAL DATA:  Shortness of breath. EXAM: CHEST - 2 VIEW COMPARISON:  07/28/2022 FINDINGS: The lungs are clear without focal pneumonia, edema, pneumothorax or pleural effusion. Cardiopericardial silhouette is at upper limits of normal for size. No acute bony abnormality. IMPRESSION: No active cardiopulmonary disease. Electronically Signed   By: Kennith Center M.D.   On: 12/15/2022 15:26    Procedures Procedures    Medications Ordered in ED Medications - No data to  display  ED Course/ Medical Decision Making/ A&P                                 Medical Decision Making Amount and/or Complexity of Data Reviewed Labs: ordered. Radiology: ordered.  Risk Prescription drug management.   77 year old female presents emergency department with fatigue and intermittent shortness over the past couple days with acute on chronic bilateral leg edema.  Compliant with Lasix.  She is hypertensive on arrival.  Takes Norvasc daily but was recently on hydralazine twice a day that she now only takes once a day.  EKG is unchanged for the patient.  Chest x-ray shows no acute finding.  Troponins are negative with no delta, kidney function is baseline and no other acute findings on workup.  Patient was given an additional IV dose of Lasix with diuresis.  After dose of hydralazine her blood pressure improved.  Discussed putting her back on the twice daily dosing of hydralazine till she can be reevaluated by her primary doctor and patient and family understand and agree.  Otherwise she feels well, no acute chest pain, she is baseline and not short of breath.  Patient at this time appears safe and stable for discharge and close outpatient follow up. Discharge plan and strict return to ED precautions discussed, patient verbalizes understanding and agreement.        Final Clinical Impression(s) / ED Diagnoses Final diagnoses:  None    Rx / DC Orders ED Discharge Orders     None         Rozelle Logan, DO 12/15/22 2245

## 2022-12-15 NOTE — ED Triage Notes (Signed)
Pt arrived via POV. C/o leg swelling with 12lb weight gain over past 3x days, w/SOB.  Hx CHF and kidney failure.  AOX4

## 2022-12-16 ENCOUNTER — Other Ambulatory Visit (HOSPITAL_COMMUNITY): Payer: Self-pay

## 2022-12-16 ENCOUNTER — Encounter: Payer: Self-pay | Admitting: Hematology

## 2022-12-16 DIAGNOSIS — I159 Secondary hypertension, unspecified: Secondary | ICD-10-CM | POA: Diagnosis not present

## 2022-12-17 ENCOUNTER — Other Ambulatory Visit (HOSPITAL_COMMUNITY): Payer: Self-pay

## 2022-12-18 DIAGNOSIS — M545 Low back pain, unspecified: Secondary | ICD-10-CM | POA: Diagnosis not present

## 2022-12-20 ENCOUNTER — Inpatient Hospital Stay: Payer: Medicare HMO | Attending: Hematology

## 2022-12-20 ENCOUNTER — Inpatient Hospital Stay: Payer: Medicare HMO

## 2022-12-20 ENCOUNTER — Encounter: Payer: Self-pay | Admitting: Hematology

## 2022-12-20 VITALS — BP 119/88 | HR 88 | Temp 98.6°F | Resp 16

## 2022-12-20 DIAGNOSIS — D631 Anemia in chronic kidney disease: Secondary | ICD-10-CM | POA: Diagnosis not present

## 2022-12-20 DIAGNOSIS — D509 Iron deficiency anemia, unspecified: Secondary | ICD-10-CM

## 2022-12-20 DIAGNOSIS — E538 Deficiency of other specified B group vitamins: Secondary | ICD-10-CM | POA: Diagnosis not present

## 2022-12-20 DIAGNOSIS — N184 Chronic kidney disease, stage 4 (severe): Secondary | ICD-10-CM | POA: Insufficient documentation

## 2022-12-20 DIAGNOSIS — D464 Refractory anemia, unspecified: Secondary | ICD-10-CM

## 2022-12-20 LAB — CBC WITH DIFFERENTIAL (CANCER CENTER ONLY)
Abs Immature Granulocytes: 0.09 10*3/uL — ABNORMAL HIGH (ref 0.00–0.07)
Basophils Absolute: 0 10*3/uL (ref 0.0–0.1)
Basophils Relative: 0 %
Eosinophils Absolute: 0 10*3/uL (ref 0.0–0.5)
Eosinophils Relative: 0 %
HCT: 31.2 % — ABNORMAL LOW (ref 36.0–46.0)
Hemoglobin: 10 g/dL — ABNORMAL LOW (ref 12.0–15.0)
Immature Granulocytes: 1 %
Lymphocytes Relative: 4 %
Lymphs Abs: 0.4 10*3/uL — ABNORMAL LOW (ref 0.7–4.0)
MCH: 28.3 pg (ref 26.0–34.0)
MCHC: 32.1 g/dL (ref 30.0–36.0)
MCV: 88.4 fL (ref 80.0–100.0)
Monocytes Absolute: 0.4 10*3/uL (ref 0.1–1.0)
Monocytes Relative: 4 %
Neutro Abs: 9.9 10*3/uL — ABNORMAL HIGH (ref 1.7–7.7)
Neutrophils Relative %: 91 %
Platelet Count: 191 10*3/uL (ref 150–400)
RBC: 3.53 MIL/uL — ABNORMAL LOW (ref 3.87–5.11)
RDW: 17.7 % — ABNORMAL HIGH (ref 11.5–15.5)
WBC Count: 10.9 10*3/uL — ABNORMAL HIGH (ref 4.0–10.5)
nRBC: 0 % (ref 0.0–0.2)

## 2022-12-20 LAB — SAMPLE TO BLOOD BANK

## 2022-12-20 MED ORDER — CYANOCOBALAMIN 1000 MCG/ML IJ SOLN
1000.0000 ug | Freq: Once | INTRAMUSCULAR | Status: AC
  Administered 2022-12-20: 1000 ug via INTRAMUSCULAR
  Filled 2022-12-20: qty 1

## 2022-12-20 MED ORDER — EPOETIN ALFA-EPBX 40000 UNIT/ML IJ SOLN
40000.0000 [IU] | Freq: Once | INTRAMUSCULAR | Status: AC
  Administered 2022-12-20: 40000 [IU] via SUBCUTANEOUS
  Filled 2022-12-20: qty 1

## 2022-12-27 ENCOUNTER — Ambulatory Visit: Payer: Medicare HMO | Admitting: Neurology

## 2022-12-27 ENCOUNTER — Encounter: Payer: Self-pay | Admitting: Neurology

## 2022-12-27 VITALS — BP 130/68 | Ht 64.0 in | Wt 173.0 lb

## 2022-12-27 DIAGNOSIS — G3184 Mild cognitive impairment, so stated: Secondary | ICD-10-CM | POA: Diagnosis not present

## 2022-12-27 DIAGNOSIS — F32A Depression, unspecified: Secondary | ICD-10-CM | POA: Diagnosis not present

## 2022-12-27 DIAGNOSIS — F4321 Adjustment disorder with depressed mood: Secondary | ICD-10-CM | POA: Diagnosis not present

## 2022-12-27 MED ORDER — MIRTAZAPINE 15 MG PO TABS: 15.0000 mg | ORAL_TABLET | Freq: Every day | ORAL | 3 refills | Status: DC

## 2022-12-27 MED ORDER — RIVASTIGMINE TARTRATE 1.5 MG PO CAPS: 1.5000 mg | ORAL_CAPSULE | Freq: Two times a day (BID) | ORAL | 6 refills | Status: DC

## 2022-12-27 NOTE — Patient Instructions (Addendum)
ATN profile to rule out Alzheimer disease biomarkers, will add B1 level  Increase Mirtazapine to 15 mg nightly  Start Exelon 1.5 mg twice daily, will increase to 3 mg BID as tolerated  Will consider increasing her Fluoxetine in 4 to 6 weeks in symptoms not improved  Continue your other medications  Return in 6 months or sooner if worse   There are well-accepted and sensible ways to reduce risk for Alzheimers disease and other degenerative brain disorders .  Exercise Daily Walk A daily 20 minute walk should be part of your routine. Disease related apathy can be a significant roadblock to exercise and the only way to overcome this is to make it a daily routine and perhaps have a reward at the end (something your loved one loves to eat or drink perhaps) or a personal trainer coming to the home can also be very useful. Most importantly, the patient is much more likely to exercise if the caregiver / spouse does it with him/her. In general a structured, repetitive schedule is best.  General Health: Any diseases which effect your body will effect your brain such as a pneumonia, urinary infection, blood clot, heart attack or stroke. Keep contact with your primary care doctor for regular follow ups.  Sleep. A good nights sleep is healthy for the brain. Seven hours is recommended. If you have insomnia or poor sleep habits we can give you some instructions. If you have sleep apnea wear your mask.  Diet: Eating a heart healthy diet is also a good idea; fish and poultry instead of red meat, nuts (mostly non-peanuts), vegetables, fruits, olive oil or canola oil (instead of butter), minimal salt (use other spices to flavor foods), whole grain rice, bread, cereal and pasta and wine in moderation.Research is now showing that the MIND diet, which is a combination of The Mediterranean diet and the DASH diet, is beneficial for cognitive processing and longevity. Information about this diet can be found in The MIND Diet,  a book by Alonna Minium, MS, RDN, and online at [employer]  Finances, Power of [address] and Advance Directives: You should consider putting legal safeguards in place with regard to financial and medical decision making. While the spouse always has power of attorney for medical and financial issues in the absence of any form, you should consider what you want in case the spouse / caregiver is no longer around or capable of making decisions.

## 2022-12-27 NOTE — Progress Notes (Signed)
[facility]  PATIENT: [name] DOB: Nov 03, 1945  REQUESTING CLINICIAN: Renford Dills, MD HISTORY FROM: Patient/Daughter  REASON FOR VISIT: Memory loss    HISTORICAL  CHIEF COMPLAINT:  Chief Complaint  Patient presents with   New Patient (Initial Visit)    Rm 12, with daughter, moca    HISTORY OF PRESENT ILLNESS:  This is 77 year old woman with medical history including hypertension, hyperlipidemia, CAD, CHF, depression, who is presenting with her daughter for memory loss.  Patient tells me that her memory is not what it used to be.  She is forgetful, misplacing items but she is independent.   Daughter tells me that patient memory starting declining in the past few years.  Two years ago, they moved her to live with her son.  She is independent but she is forgetful, forgets about recent conversation, repeat herself.  She has not been driving for the past 2 years.   Daughter also tells me that patient has been picking up calls, random calls, has been changing her medical plan, changed it 3 times in the past year.  So far no report of losing large amount of money.  She was diagnosed with depression and started on mirtazapine and fluoxetine and she is doing okay.  In the past couple weeks, patient lost his son and depression and memory loss have been worse since then.    TBI:  No past history of TBI Stroke:   no past history of stroke Seizures:  no past history of seizures Sleep: no history of sleep apnea.  Mood: Yes ,with depression, on Mirtazapine and Fluoxetine Family history of Dementia:  Denies  Functional status: independent in most ADLs and IADLs Patient lives alone. Cooking: yes, no issues Cleaning: yes Shopping: yes Bathing: no issues  Toileting: no issues  Driving: not driven in the past 2 years ago Bills: daughter  Medications: no issues  Ever left the stove on by accident?: Denies  Forget how to use items around the house?:  no Getting lost going to familiar places?: no  Forgetting loved ones names?: yes Word finding difficulty? Yes  Sleep: Usually 8 hours, but not good recently due to loss of son   OTHER MEDICAL CONDITIONS: Hypertension, Hyperlipidemia, Coronary artery disease, CHF, Depression    REVIEW OF SYSTEMS: Full 14 system review of systems performed and negative with exception of: As noted in the HPI   ALLERGIES: Allergies  Allergen Reactions   Anesthetics, Halogenated Other (See Comments)    Per patient's daughter, she can not tolerate anesthetics.    Etomidate Other (See Comments)    Heart stopped during anesthesia   Fentanyl Other (See Comments)    Heart stopped during anesthesia    Percocet [Oxycodone-Acetaminophen] Other (See Comments)    Headaches    Diazepam Other (See Comments)    Agitation   Lorazepam Nausea Only   Tramadol Hcl Nausea And Vomiting   Haloperidol Nausea And Vomiting    **Haldol**   Morphine Sulfate Nausea Only    agitation   Propoxyphene Nausea And Vomiting    **Darvocet** no longer available   Versed [Midazolam] Other (See Comments)    Heart stopped during anesthesia    HOME MEDICATIONS: Outpatient Medications Prior to Visit  Medication Sig Dispense Refill   acetaminophen (TYLENOL) 500 MG tablet Take 1 tablet (500 mg total) by mouth every 6 (six) hours as needed. (Patient taking differently: Take 500 mg by mouth every 6 (six) hours as needed for mild pain.) 30  tablet 0   amLODipine (NORVASC) 10 MG tablet Take 1 tablet (10 mg total) by mouth daily. 90 tablet 3   apixaban (ELIQUIS) 2.5 MG TABS tablet Take 1 tablet (2.5 mg total) by mouth 2 (two) times daily. 60 tablet 0   colestipol (COLESTID) 1 g tablet Take 2 g by mouth at bedtime.     CREON 36000-114000 units CPEP capsule Take 36,000 Units by mouth 3 (three) times daily with meals.     docusate sodium (COLACE) 100 MG capsule Take 1 capsule (100 mg total) by mouth 2 (two) times daily. 10 capsule 0    famotidine (PEPCID) 20 MG tablet Take 20 mg by mouth daily as needed for heartburn or indigestion.     furosemide (LASIX) 40 MG tablet Take 1 tablet (40 mg total) by mouth daily. (Patient taking differently: Take 40 mg by mouth in the morning.) 90 tablet 3   hydrALAZINE (APRESOLINE) 50 MG tablet Take 1 tablet (50 mg total) by mouth in the morning and at bedtime. 60 tablet 1   methocarbamol (ROBAXIN) 500 MG tablet Take 1 tablet (500 mg total) by mouth 2 (two) times daily as needed for muscle spasms. 10 tablet 0   nitroGLYCERIN (NITROSTAT) 0.4 MG SL tablet DISSOLVE 1 TABLET UNDER THE TONGUE EVERY 5 MINUTES AS  NEEDED FOR CHEST PAIN. MAX  OF 3 TABLETS IN 15 MINUTES. CALL 911 IF PAIN PERSISTS. (Patient taking differently: Place 0.4 mg under the tongue every 5 (five) minutes as needed for chest pain.) 25 tablet 0   pantoprazole (PROTONIX) 40 MG tablet Take 1 tablet (40 mg total) by mouth 2 (two) times daily. 30 tablet 0   tiZANidine (ZANAFLEX) 4 MG capsule Take 1 capsule (4 mg total) by mouth 3 (three) times daily. 15 capsule 0   mirtazapine (REMERON) 7.5 MG tablet Take 1 tablet (7.5 mg total) by mouth at bedtime. 90 tablet 1   No facility-administered medications prior to visit.    PAST MEDICAL HISTORY: Past Medical History:  Diagnosis Date   Abdominal discomfort    Chronic N/V/D. Presumptive dx Crohn's dx per elevated p ANCA. Failed Entocort and Pentasa. Sep 2003 - ileocolectomy c anastomosis per Dr Orson Slick 2/2 adhesions - path was hegative for Crohns. EGD, Sm bowel follow through (11/03), and an eteroclysis (10/03) were unrevealing. Cuases hypomag and hypocalcemia.   Adnexal mass 8/03   s/p lap BSO (R ovarian fibroma) & lysis of adhesions   Allergy    Seasonal   Anemia    Multifactorial. Baseline HgB 10-11 ish. B12 def - 150 in 3/10. Fe Def - ferritin 35 3/10. Both are being repleted.   Breast cancer (HCC) 03/16/13   right, 5 o'clock   CAD (coronary artery disease) 1996   1996 - PTCA and  angioplasty diagonal branch. 2000 - Rotoblator & angiopllasty of diagonal. 2006 - subendocardial AMI, DES to proximal LAD.Marland Kitchen Also had 90% stenosis in distal apical LAD. EF 55 with apical hypokinesis. Indefinite ASA and Plavix.   CHF (congestive heart failure) (HCC)    Chronic kidney disease    Chronic renal insuff baseline Cr 1.2 - 1.4 ish.   Chronic kidney disease, stage III (moderate) (HCC)    Chronic renal insuff baseline Cr 1.2 - 1.4 ish. New baseline about 2 (2018)   Chronic pain    CT 10/10 = Spinal stenosis L2 - S1.   COVID-19 11/02/2020   Diabetes mellitus    Insulin dependent   Diabetes mellitus type 2 in obese 05/03/2010  Managed on lantus and novolog. Has diabetic nephropathy. Metformin D/C'd 2012 2/2 creatinine.  No diabetic retinopathy per 3/11.    Gout    Hx of radiation therapy 06/02/13- 07/16/13   right rbeast 4500 cGy 25 sessions, right breast boost 1600 cGy in 8 sessions   Hyperlipidemia    Managed with both a statin and Welchol. Welchol stopped 2014 2/2 cost and started on fenofibrate    Hypertension    2006 B renal arteries patent. 2003 MRA - no RAS. 2003 pheo W/U Dr Caryn Section reportedly negative.   Hypoxia 07/23/2017   Lupus    Lymphedema of breast    Personal history of radiation therapy 2015   Pneumonia of left lung due to infectious organism    RBBB    Renal vein thrombosis (HCC)    SBO (small bowel obstruction) (HCC) 09/17/2017   Secondary hyperparathyroidism (HCC)    Stroke (HCC)    Incidental finding MRI 2002 L lacunar infarct   Vitamin B12 deficiency    Vitamin D deficiency    Wears dentures    top    PAST SURGICAL HISTORY: Past Surgical History:  Procedure Laterality Date   ABDOMINAL HYSTERECTOMY     BILATERAL SALPINGOOPHORECTOMY  8/03   Lap BSO (R ovarian fibroma) and adhesion lysis   BIOPSY  09/08/2020   Procedure: BIOPSY;  Surgeon: Bernette Redbird, MD;  Location: [facility];  Service: Endoscopy;;   BOWEL RESECTION  2003   ileocolectomy with  anastomosis 2/2 adhesions   BREAST BIOPSY Right 2015   BREAST BIOPSY Right 07/2014   BREAST LUMPECTOMY Right 04/22/2013   BREAST LUMPECTOMY WITH NEEDLE LOCALIZATION AND AXILLARY SENTINEL LYMPH NODE BX Right 04/22/2013   Procedure: BREAST LUMPECTOMY WITH NEEDLE LOCALIZATION AND AXILLARY SENTINEL LYMPH NODE BX;  Surgeon: Almond Lint, MD;  Location: [name];  Service: General;  Laterality: Right;   CARDIAC CATHETERIZATION     2 stents   CHOLECYSTECTOMY     COLONOSCOPY     ESOPHAGOGASTRODUODENOSCOPY N/A 09/08/2020   Procedure: ESOPHAGOGASTRODUODENOSCOPY (EGD);  Surgeon: Bernette Redbird, MD;  Location: [facility];  Service: Endoscopy;  Laterality: N/A;   HEMICOLECTOMY     R sided hemicolectomy   HERNIA REPAIR     Ventral hernia repair   PTCA  4/06    FAMILY HISTORY: Family History  Problem Relation Age of Onset   Breast cancer Mother 50   Pancreatic cancer Brother 46   Lung cancer Maternal Aunt    Breast cancer Maternal Aunt    Kidney failure Maternal Aunt    Prostate cancer Maternal Uncle    Heart attack Maternal Grandmother    Breast cancer Maternal Grandmother    Colon cancer Maternal Grandmother    Clotting disorder Maternal Grandmother    Diabetes Maternal Grandmother    Hypertension Daughter    Pancreatic cancer Other     SOCIAL HISTORY: Social History   Socioeconomic History   Marital status: Widowed    Spouse name: Not on file   Number of children: 5   Years of education: Not on file   Highest education level: High school graduate  Occupational History   Occupation: retired    Associate Professor: [employer]  Tobacco Use   Smoking status: Never   Smokeless tobacco: Never  Vaping Use   Vaping status: Never Used  Substance and Sexual Activity   Alcohol use: No    Alcohol/week: 0.0 standard drinks of alcohol   Drug use: No   Sexual activity: Not  on file  Other Topics Concern   Not on file  Social History Narrative   She is separated 2  sons 3 daughters   She has grandchildren and great-grandchildren   She is retired   Never smoker no tobacco now no drugs no alcohol no caffeine   Right handed   Social Determinants of Health   Financial Resource Strain: Low Risk  (10/25/2021)   Overall Financial Resource Strain (CARDIA)    Difficulty of Paying Living Expenses: Not very hard  Food Insecurity: No Food Insecurity (09/22/2022)   Hunger Vital Sign    Worried About Running Out of Food in the Last Year: Never true    Ran Out of Food in the Last Year: Never true  Transportation Needs: No Transportation Needs (09/22/2022)   PRAPARE - Administrator, Civil Service (Medical): No    Lack of Transportation (Non-Medical): No  Physical Activity: Not on file  Stress: Not on file  Social Connections: Not on file  Intimate Partner Violence: Not At Risk (09/22/2022)   Humiliation, Afraid, Rape, and Kick questionnaire    Fear of Current or Ex-Partner: No    Emotionally Abused: No    Physically Abused: No    Sexually Abused: No    PHYSICAL EXAM  GENERAL EXAM/CONSTITUTIONAL: Vitals:  Vitals:   12/27/22 1456  BP: 130/68  Weight: 173 lb (78.5 kg)  Height: 5\' 4"  (1.626 m)   Body mass index is 29.7 kg/m. Wt Readings from Last 3 Encounters:  12/27/22 173 lb (78.5 kg)  12/15/22 174 lb (78.9 kg)  11/26/22 160 lb 11.5 oz (72.9 kg)   Patient is in no distress; well developed, nourished and groomed; neck is supple  MUSCULOSKELETAL: Gait, strength, tone, movements noted in Neurologic exam below  NEUROLOGIC: MENTAL STATUS:     12/27/2022    3:01 PM  MMSE - Mini Mental State Exam  Orientation to time 3  Orientation to Place 5  Registration 3  Attention/ Calculation 1  Recall 0  Language- name 2 objects 2  Language- repeat 0  Language- follow 3 step command 3  Language- read & follow direction 1  Write a sentence 1  Copy design 0  Total score 19   CRANIAL NERVE:  2nd, 3rd, 4th, 6th- visual fields full to  confrontation, extraocular muscles intact, no nystagmus 5th - facial sensation symmetric 7th - facial strength symmetric 8th - hearing intact 9th - palate elevates symmetrically, uvula midline 11th - shoulder shrug symmetric 12th - tongue protrusion midline  MOTOR:  normal bulk and tone, full strength in the BUE, BLE  SENSORY:  normal and symmetric to light touch  COORDINATION:  finger-nose-finger, fine finger movements normal  GAIT/STATION:  normal   DIAGNOSTIC DATA (LABS, IMAGING, TESTING) - I reviewed patient records, labs, notes, testing and imaging myself where available.  Lab Results  Component Value Date   WBC 10.9 (H) 12/20/2022   HGB 10.0 (L) 12/20/2022   HCT 31.2 (L) 12/20/2022   MCV 88.4 12/20/2022   PLT 191 12/20/2022      Component Value Date/Time   NA 138 12/15/2022 1802   NA 144 07/19/2020 1458   NA 140 03/14/2017 0810   K 4.6 12/15/2022 1802   K 3.8 03/14/2017 0810   CL 109 12/15/2022 1802   CO2 19 (L) 12/15/2022 1802   CO2 27 03/14/2017 0810   GLUCOSE 163 (H) 12/15/2022 1802   GLUCOSE 177 (H) 03/14/2017 0810   BUN 58 (H) 12/15/2022  1802   BUN 85 (HH) 07/19/2020 1458   BUN 35.6 (H) 03/14/2017 0810   CREATININE 2.44 (H) 12/15/2022 1802   CREATININE 2.31 (H) 10/25/2022 1355   CREATININE 1.8 (H) 03/14/2017 0810   CALCIUM 9.1 12/15/2022 1802   CALCIUM 9.6 03/14/2017 0810   PROT 7.7 10/25/2022 1355   PROT 7.8 03/14/2017 0810   ALBUMIN 3.8 10/25/2022 1355   ALBUMIN 3.5 03/14/2017 0810   AST 12 (L) 10/25/2022 1355   AST 17 03/14/2017 0810   ALT 9 10/25/2022 1355   ALT 17 03/14/2017 0810   ALKPHOS 74 10/25/2022 1355   ALKPHOS 61 03/14/2017 0810   BILITOT 0.3 10/25/2022 1355   BILITOT 0.28 03/14/2017 0810   GFRNONAA 20 (L) 12/15/2022 1802   GFRNONAA 22 (L) 10/25/2022 1355   GFRNONAA 45 (L) 12/17/2013 1158   GFRAA 21 (L) 12/21/2019 0845   GFRAA 52 (L) 12/17/2013 1158   Lab Results  Component Value Date   CHOL 117 11/16/2020   HDL 59  11/16/2020   LDLCALC 40 11/16/2020   LDLDIRECT 55 07/01/2012   TRIG 89 11/16/2020   CHOLHDL 2.0 11/16/2020   Lab Results  Component Value Date   HGBA1C 5.3 09/22/2022   Lab Results  Component Value Date   VITAMINB12 624 12/06/2022   Lab Results  Component Value Date   TSH 1.694 11/15/2020    MRI Brain 07/28/2022 1. No acute intracranial abnormality. 2. Old small vessel infarcts of the deep gray and white matter. 3. Chronic microvascular ischemia.    ASSESSMENT AND PLAN  77 y.o. year old female with with medical history including hypertension, hyperlipidemia, CAD, CHF, depression who is presenting with memory loss, repeating herself, being forgetful for past few years and getting worse.  On top of that, patient recently lost her son and her depression and memory loss got worse.  Today she scored a 19 out of 30 on MMSE while 2 months ago her score was 25 on the MoCA.  I have informed patient and daughter that I do believe that her depression and grief is making her memory worse.  She is already on mirtazapine and fluoxetine, plan will be to increase the mirtazapine to 15 mg nightly because she reports she is not sleeping well and if her symptoms do not improve in 4 to 6 weeks, we can also increase the fluoxetine.  I do believe that at baseline she does have some cognitive impairment, we will obtain ATN profile to look rule out Alzheimer disease biomarker, and start the patient on Exelon 1.5 mg twice daily.  If able to tolerate will increase it to 3 mg twice daily.  I will see her in 6 months for follow-up at that time I will repeat the MMSE.  She voiced understanding.   1. Mild cognitive impairment   2. Depression, unspecified depression type   3. Grief      Patient Instructions  ATN profile to rule out Alzheimer disease biomarkers, will add B1 level  Increase Mirtazapine to 15 mg nightly  Start Exelon 1.5 mg twice daily, will increase to 3 mg BID as tolerated  Will consider  increasing her Fluoxetine in 4 to 6 weeks in symptoms not improved  Continue your other medications  Return in 6 months or sooner if worse   There are well-accepted and sensible ways to reduce risk for Alzheimers disease and other degenerative brain disorders .  Exercise Daily Walk A daily 20 minute walk should be part of your routine.  Disease related apathy can be a significant roadblock to exercise and the only way to overcome this is to make it a daily routine and perhaps have a reward at the end (something your loved one loves to eat or drink perhaps) or a personal trainer coming to the home can also be very useful. Most importantly, the patient is much more likely to exercise if the caregiver / spouse does it with him/her. In general a structured, repetitive schedule is best.  General Health: Any diseases which effect your body will effect your brain such as a pneumonia, urinary infection, blood clot, heart attack or stroke. Keep contact with your primary care doctor for regular follow ups.  Sleep. A good nights sleep is healthy for the brain. Seven hours is recommended. If you have insomnia or poor sleep habits we can give you some instructions. If you have sleep apnea wear your mask.  Diet: Eating a heart healthy diet is also a good idea; fish and poultry instead of red meat, nuts (mostly non-peanuts), vegetables, fruits, olive oil or canola oil (instead of butter), minimal salt (use other spices to flavor foods), whole grain rice, bread, cereal and pasta and wine in moderation.Research is now showing that the MIND diet, which is a combination of The Mediterranean diet and the DASH diet, is beneficial for cognitive processing and longevity. Information about this diet can be found in The MIND Diet, a book by Alonna Minium, MS, RDN, and online at [employer]  Finances, Power of [address] and Advance Directives: You should consider putting legal safeguards in place  with regard to financial and medical decision making. While the spouse always has power of attorney for medical and financial issues in the absence of any form, you should consider what you want in case the spouse / caregiver is no longer around or capable of making decisions.     Orders Placed This Encounter  Procedures   ATN PROFILE   Vitamin B1    Meds ordered this encounter  Medications   mirtazapine (REMERON) 15 MG tablet    Sig: Take 1 tablet (15 mg total) by mouth at bedtime.    Dispense:  90 tablet    Refill:  3   rivastigmine (EXELON) 1.5 MG capsule    Sig: Take 1 capsule (1.5 mg total) by mouth 2 (two) times daily.    Dispense:  60 capsule    Refill:  6    Return in about 6 months (around 06/27/2023).  I have spent a total of 65 minutes dedicated to this patient today, preparing to see patient, performing a medically appropriate examination and evaluation, ordering tests and/or medications and procedures, and counseling and educating the patient/family/caregiver; independently interpreting result and communicating results to the family/patient/caregiver; and documenting clinical information in the electronic medical record.   Windell Norfolk, MD 12/29/2022, 8:41 AM  [facility] [address], [address] [place], [place] [address] [phone]

## 2023-01-02 ENCOUNTER — Other Ambulatory Visit: Payer: Self-pay

## 2023-01-02 ENCOUNTER — Emergency Department (HOSPITAL_BASED_OUTPATIENT_CLINIC_OR_DEPARTMENT_OTHER)
Admission: EM | Admit: 2023-01-02 | Discharge: 2023-01-02 | Disposition: A | Discharge status: Home / Self Care | Payer: Medicare HMO | Attending: Emergency Medicine | Admitting: Emergency Medicine

## 2023-01-02 ENCOUNTER — Emergency Department (HOSPITAL_BASED_OUTPATIENT_CLINIC_OR_DEPARTMENT_OTHER): Payer: Medicare HMO | Admitting: Radiology

## 2023-01-02 ENCOUNTER — Encounter (HOSPITAL_BASED_OUTPATIENT_CLINIC_OR_DEPARTMENT_OTHER): Payer: Self-pay | Admitting: Emergency Medicine

## 2023-01-02 DIAGNOSIS — I2510 Atherosclerotic heart disease of native coronary artery without angina pectoris: Secondary | ICD-10-CM | POA: Diagnosis not present

## 2023-01-02 DIAGNOSIS — M545 Low back pain, unspecified: Secondary | ICD-10-CM | POA: Diagnosis not present

## 2023-01-02 DIAGNOSIS — E1122 Type 2 diabetes mellitus with diabetic chronic kidney disease: Secondary | ICD-10-CM | POA: Insufficient documentation

## 2023-01-02 DIAGNOSIS — N189 Chronic kidney disease, unspecified: Secondary | ICD-10-CM | POA: Insufficient documentation

## 2023-01-02 DIAGNOSIS — Z7901 Long term (current) use of anticoagulants: Secondary | ICD-10-CM | POA: Insufficient documentation

## 2023-01-02 DIAGNOSIS — I509 Heart failure, unspecified: Secondary | ICD-10-CM | POA: Insufficient documentation

## 2023-01-02 DIAGNOSIS — I110 Hypertensive heart disease with heart failure: Secondary | ICD-10-CM | POA: Diagnosis not present

## 2023-01-02 DIAGNOSIS — I130 Hypertensive heart and chronic kidney disease with heart failure and stage 1 through stage 4 chronic kidney disease, or unspecified chronic kidney disease: Secondary | ICD-10-CM | POA: Insufficient documentation

## 2023-01-02 DIAGNOSIS — R0602 Shortness of breath: Secondary | ICD-10-CM | POA: Diagnosis not present

## 2023-01-02 DIAGNOSIS — Z79899 Other long term (current) drug therapy: Secondary | ICD-10-CM | POA: Diagnosis not present

## 2023-01-02 LAB — BASIC METABOLIC PANEL
Anion gap: 11 (ref 5–15)
BUN: 59 mg/dL — ABNORMAL HIGH (ref 8–23)
CO2: 19 mmol/L — ABNORMAL LOW (ref 22–32)
Calcium: 9.2 mg/dL (ref 8.9–10.3)
Chloride: 108 mmol/L (ref 98–111)
Creatinine, Ser: 2.69 mg/dL — ABNORMAL HIGH (ref 0.44–1.00)
GFR, Estimated: 18 mL/min — ABNORMAL LOW (ref >60)
Glucose, Bld: 121 mg/dL — ABNORMAL HIGH (ref 70–99)
Potassium: 4.3 mmol/L (ref 3.5–5.1)
Sodium: 138 mmol/L (ref 135–145)

## 2023-01-02 LAB — CBC
HCT: 35.2 % — ABNORMAL LOW (ref 36.0–46.0)
Hemoglobin: 11.2 g/dL — ABNORMAL LOW (ref 12.0–15.0)
MCH: 27.9 pg (ref 26.0–34.0)
MCHC: 31.8 g/dL (ref 30.0–36.0)
MCV: 87.8 fL (ref 80.0–100.0)
Platelets: 283 10*3/uL (ref 150–400)
RBC: 4.01 MIL/uL (ref 3.87–5.11)
RDW: 18.2 % — ABNORMAL HIGH (ref 11.5–15.5)
WBC: 9.4 10*3/uL (ref 4.0–10.5)
nRBC: 0 % (ref 0.0–0.2)

## 2023-01-02 LAB — TROPONIN I (HIGH SENSITIVITY)
Troponin I (High Sensitivity): 17 ng/L (ref <18)
Troponin I (High Sensitivity): 18 ng/L — ABNORMAL HIGH (ref <18)

## 2023-01-02 LAB — BRAIN NATRIURETIC PEPTIDE: B Natriuretic Peptide: 215.9 pg/mL — ABNORMAL HIGH (ref 0.0–100.0)

## 2023-01-02 MED ORDER — FUROSEMIDE 10 MG/ML IJ SOLN
40.0000 mg | Freq: Once | INTRAMUSCULAR | Status: AC
  Administered 2023-01-02: 40 mg via INTRAVENOUS
  Filled 2023-01-02: qty 4

## 2023-01-02 MED ORDER — ALBUTEROL SULFATE HFA 108 (90 BASE) MCG/ACT IN AERS: 2.0000 | INHALATION_SPRAY | RESPIRATORY_TRACT | Status: DC | PRN

## 2023-01-02 NOTE — ED Provider Triage Note (Signed)
Emergency Medicine Provider Triage Evaluation Note  [name] , a 77 y.o. female  was evaluated in triage.  Pt complains of increasing shortness of breath.  Reports 6 pound weight increase over the past couple of days.  She has a history of congestive heart failure on 40mg  Lasix twice daily.  No recent changes to her regimen.  She has swelling of her lower remedies.  Denies orthostasis.  Review of Systems  Positive: Shortness of breath Negative: Chest pain  Physical Exam  BP (!) 188/82 (BP Location: Right Arm)   Pulse 89   Temp (!) 96.8 F (36 C)   Resp 19   SpO2 95%  Gen:   Awake, no distress   Resp:  Normal effort  MSK:   Moves extremities without difficulty  Other:    Medical Decision Making  Medically screening exam initiated at 5:13 PM.  Appropriate orders placed.  [name] was informed that the remainder of the evaluation will be completed by another provider, this initial triage assessment does not replace that evaluation, and the importance of remaining in the ED until their evaluation is complete.     Renne Crigler, PA-C 01/02/23 1714

## 2023-01-02 NOTE — ED Triage Notes (Signed)
Increased work of breathing while walking, 175 lbs, increased 6 lbs I npast 3 days.

## 2023-01-02 NOTE — ED Notes (Signed)
Pt's O2 while ambulating in hallway was 96-97. Her HR was in the 80's.

## 2023-01-02 NOTE — Discharge Instructions (Addendum)
You were seen the emergency department for shortness of breath We believe the most likely cause of your shortness of breath this heart failure The chest x-ray showed some congestion but not a pneumonia or severe volume overload We gave you a dose of Lasix here and you were able to walk without your oxygen dropping or becoming short of breath We feel comfortable with you going home as long as you follow-up with your primary care doctor and with your nephrology team as scheduled Return to the emerged part for chest pain, trouble breathing or any other concerns

## 2023-01-02 NOTE — ED Notes (Signed)
 RN reviewed discharge instructions with pt. Pt verbalized understanding and had no further questions. VSS upon discharge.  

## 2023-01-02 NOTE — ED Provider Notes (Signed)
[place] EMERGENCY DEPARTMENT AT [facility] Provider Note   CSN: [id number] Arrival date & time: 01/02/23  1648     History  No chief complaint on file.   [name] is a 77 y.o. female.  With a past medical history of heart failure, CKD, CAD, type 2 diabetes and hypertension who presents to the ED for shortness of breath.  Patient reports 4 days of worsening shortness of breath exacerbated with exertion walking short distances.  Daughter noted her to be extremity short of breath when walking a short distance to the car earlier today.  Daughter also reports 6 pound weight gain over the last 3 days as well as increased peripheral edema.  Patient denies chest pain, fevers, chills, productive coughing, nausea, vomiting and abdominal pain.  She has been compliant with her home regimen including 40 mg Lasix daily  HPI     Home Medications Prior to Admission medications   Medication Sig Start Date End Date Taking? Authorizing Provider  acetaminophen (TYLENOL) 500 MG tablet Take 1 tablet (500 mg total) by mouth every 6 (six) hours as needed. Patient taking differently: Take 500 mg by mouth every 6 (six) hours as needed for mild pain. 05/11/22   Derwood Kaplan, MD  amLODipine (NORVASC) 10 MG tablet Take 1 tablet (10 mg total) by mouth daily. 10/26/22     apixaban (ELIQUIS) 2.5 MG TABS tablet Take 1 tablet (2.5 mg total) by mouth 2 (two) times daily. 10/30/21   Arrien, York Ram, MD  colestipol (COLESTID) 1 g tablet Take 2 g by mouth at bedtime. 07/06/22   [provider]  CREON 36000-114000 units CPEP capsule Take 36,000 Units by mouth 3 (three) times daily with meals. 06/05/22   [provider]  docusate sodium (COLACE) 100 MG capsule Take 1 capsule (100 mg total) by mouth 2 (two) times daily. 09/25/22   Barnetta Chapel, MD  famotidine (PEPCID) 20 MG tablet Take 20 mg by mouth daily as needed for heartburn or indigestion. 10/14/21   [provider]  furosemide (LASIX) 40 MG tablet Take 1 tablet (40 mg total) by mouth daily. Patient taking differently: Take 40 mg by mouth in the morning. 03/28/22     hydrALAZINE (APRESOLINE) 50 MG tablet Take 1 tablet (50 mg total) by mouth in the morning and at bedtime. 12/15/22 01/14/23  Horton, Clabe Seal, DO  methocarbamol (ROBAXIN) 500 MG tablet Take 1 tablet (500 mg total) by mouth 2 (two) times daily as needed for muscle spasms. 11/26/22   Schutt, Edsel Petrin, PA-C  mirtazapine (REMERON) 15 MG tablet Take 1 tablet (15 mg total) by mouth at bedtime. 12/27/22 12/22/23  Windell Norfolk, MD  nitroGLYCERIN (NITROSTAT) 0.4 MG SL tablet DISSOLVE 1 TABLET UNDER THE TONGUE EVERY 5 MINUTES AS  NEEDED FOR CHEST PAIN. MAX  OF 3 TABLETS IN 15 MINUTES. CALL 911 IF PAIN PERSISTS. Patient taking differently: Place 0.4 mg under the tongue every 5 (five) minutes as needed for chest pain. 10/04/20   Jake Bathe, MD  pantoprazole (PROTONIX) 40 MG tablet Take 1 tablet (40 mg total) by mouth 2 (two) times daily. 12/23/21   Jacalyn Lefevre, MD  rivastigmine (EXELON) 1.5 MG capsule Take 1 capsule (1.5 mg total) by mouth 2 (two) times daily. 12/27/22 07/25/23  Windell Norfolk, MD  tiZANidine (ZANAFLEX) 4 MG capsule Take 1 capsule (4 mg total) by mouth 3 (three) times daily. 07/29/22   Hughie Closs, MD      Allergies  Anesthetics, halogenated; Etomidate; Fentanyl; Percocet [oxycodone-acetaminophen]; Diazepam; Lorazepam; Tramadol hcl; Haloperidol; Morphine sulfate; Propoxyphene; and Versed [midazolam]    Review of Systems   Review of Systems  Physical Exam Updated Vital Signs BP (!) 179/66   Pulse 79   Temp 97.9 F (36.6 C) (Oral)   Resp 18   SpO2 99%  Physical Exam Vitals and nursing note reviewed.  HENT:     Head: Normocephalic and atraumatic.  Eyes:     Pupils: Pupils are equal, round, and reactive to light.  Cardiovascular:     Rate and Rhythm: Normal rate and regular rhythm.  Pulmonary:     Effort: Pulmonary  effort is normal.     Breath sounds: Normal breath sounds.  Abdominal:     Palpations: Abdomen is soft.     Tenderness: There is no abdominal tenderness.  Musculoskeletal:     Comments: 1+ bilateral lower extremity edema extending up through pretibial region  Skin:    General: Skin is warm and dry.  Neurological:     Mental Status: She is alert.  Psychiatric:        Mood and Affect: Mood normal.     ED Results / Procedures / Treatments   Labs (all labs ordered are listed, but only abnormal results are displayed) Labs Reviewed  BASIC METABOLIC PANEL - Abnormal; Notable for the following components:      Result Value   CO2 19 (*)    Glucose, Bld 121 (*)    BUN 59 (*)    Creatinine, Ser 2.69 (*)    GFR, Estimated 18 (*)    All other components within normal limits  CBC - Abnormal; Notable for the following components:   Hemoglobin 11.2 (*)    HCT 35.2 (*)    RDW 18.2 (*)    All other components within normal limits  BRAIN NATRIURETIC PEPTIDE - Abnormal; Notable for the following components:   B Natriuretic Peptide 215.9 (*)    All other components within normal limits  TROPONIN I (HIGH SENSITIVITY) - Abnormal; Notable for the following components:   Troponin I (High Sensitivity) 18 (*)    All other components within normal limits  CBG MONITORING, ED  TROPONIN I (HIGH SENSITIVITY)    EKG None  Radiology DG Chest 2 View  Result Date: 01/02/2023 CLINICAL DATA:  Shortness of breath with exertion. EXAM: CHEST - 2 VIEW COMPARISON:  December 15, 2022 FINDINGS: The heart size and mediastinal contours are within normal limits. Mild diffusely increased interstitial lung markings are seen, slightly more prominent in appearance when compared to the prior exam. No pleural effusion or pneumothorax is identified. Radiopaque surgical clips are seen overlying the right axilla and right upper quadrant. Multilevel degenerative changes are noted within the mid and lower thoracic spine.  IMPRESSION: Findings which may represent mild interstitial edema. Electronically Signed   By: Aram Candela M.D.   On: 01/02/2023 20:48    Procedures Procedures    Medications Ordered in ED Medications  albuterol (VENTOLIN HFA) 108 (90 Base) MCG/ACT inhaler 2 puff (has no administration in time range)  furosemide (LASIX) injection 40 mg (40 mg Intravenous Given 01/02/23 2022)    ED Course/ Medical Decision Making/ A&P Clinical Course as of 01/02/23 2312  Wed Jan 02, 2023  2021 Pulmonary congestion but no effusion on chest x-ray.  BNP elevated from baseline.  CKD at baseline with renal function today.  High-sensitivity troponin of 17 with delta at 18.  Low suspicion for ACS.  Will provide IV Lasix and reevaluate to ascertain need for admission versus discharge home with close PCP follow-up [MP]  2309 Patient has urinated several times in the emergency department.  She was able to ambulate without becoming short of breath or hypoxic.  Daughter at bedside is a medical provider and feels comfortable with plan for discharge home.  She does have an outpatient nephrology appointment scheduled for tomorrow which her daughter will attend with her.  Stable for discharge at this time [MP]    Clinical Course User Index [MP] Royanne Foots, DO                                 Medical Decision Making 77 year old female with history as above presenting for worsening dyspnea on exertion, increased peripheral edema and 6 pound weight gain the last 3 days.  Afebrile and hypertensive but satting well on room air.  No overt respiratory distress.  No adventitious lung sounds.  Presentation most concerning for heart failure exacerbation but will evaluate for ACS, pneumonia and acute kidney injury as well  Amount and/or Complexity of Data Reviewed Radiology: ordered.  Risk Prescription drug management.           Final Clinical Impression(s) / ED Diagnoses Final diagnoses:  Acute on chronic  congestive heart failure, unspecified heart failure type [facility])    Rx / DC Orders ED Discharge Orders     None         Royanne Foots, DO 01/02/23 2312

## 2023-01-03 ENCOUNTER — Inpatient Hospital Stay: Payer: Medicare HMO

## 2023-01-03 ENCOUNTER — Other Ambulatory Visit: Payer: Self-pay | Admitting: Neurology

## 2023-01-03 ENCOUNTER — Other Ambulatory Visit: Payer: Self-pay

## 2023-01-03 DIAGNOSIS — N184 Chronic kidney disease, stage 4 (severe): Secondary | ICD-10-CM | POA: Diagnosis not present

## 2023-01-03 DIAGNOSIS — D464 Refractory anemia, unspecified: Secondary | ICD-10-CM

## 2023-01-03 DIAGNOSIS — E1169 Type 2 diabetes mellitus with other specified complication: Secondary | ICD-10-CM | POA: Diagnosis not present

## 2023-01-03 DIAGNOSIS — I120 Hypertensive chronic kidney disease with stage 5 chronic kidney disease or end stage renal disease: Secondary | ICD-10-CM | POA: Diagnosis not present

## 2023-01-03 DIAGNOSIS — D509 Iron deficiency anemia, unspecified: Secondary | ICD-10-CM

## 2023-01-03 DIAGNOSIS — N2581 Secondary hyperparathyroidism of renal origin: Secondary | ICD-10-CM | POA: Diagnosis not present

## 2023-01-03 DIAGNOSIS — D631 Anemia in chronic kidney disease: Secondary | ICD-10-CM

## 2023-01-03 DIAGNOSIS — N185 Chronic kidney disease, stage 5: Secondary | ICD-10-CM | POA: Diagnosis not present

## 2023-01-03 DIAGNOSIS — I509 Heart failure, unspecified: Secondary | ICD-10-CM | POA: Diagnosis not present

## 2023-01-03 DIAGNOSIS — E785 Hyperlipidemia, unspecified: Secondary | ICD-10-CM | POA: Diagnosis not present

## 2023-01-03 DIAGNOSIS — I5032 Chronic diastolic (congestive) heart failure: Secondary | ICD-10-CM | POA: Diagnosis not present

## 2023-01-03 DIAGNOSIS — I823 Embolism and thrombosis of renal vein: Secondary | ICD-10-CM | POA: Diagnosis not present

## 2023-01-03 DIAGNOSIS — E1122 Type 2 diabetes mellitus with diabetic chronic kidney disease: Secondary | ICD-10-CM | POA: Diagnosis not present

## 2023-01-03 DIAGNOSIS — E538 Deficiency of other specified B group vitamins: Secondary | ICD-10-CM | POA: Diagnosis not present

## 2023-01-03 DIAGNOSIS — G309 Alzheimer's disease, unspecified: Secondary | ICD-10-CM

## 2023-01-03 DIAGNOSIS — C50311 Malignant neoplasm of lower-inner quadrant of right female breast: Secondary | ICD-10-CM

## 2023-01-03 LAB — CBC WITH DIFFERENTIAL (CANCER CENTER ONLY)
Abs Immature Granulocytes: 0.16 10*3/uL — ABNORMAL HIGH (ref 0.00–0.07)
Basophils Absolute: 0 10*3/uL (ref 0.0–0.1)
Basophils Relative: 1 %
Eosinophils Absolute: 0 10*3/uL (ref 0.0–0.5)
Eosinophils Relative: 1 %
HCT: 35.4 % — ABNORMAL LOW (ref 36.0–46.0)
Hemoglobin: 11 g/dL — ABNORMAL LOW (ref 12.0–15.0)
Immature Granulocytes: 2 %
Lymphocytes Relative: 8 %
Lymphs Abs: 0.7 10*3/uL (ref 0.7–4.0)
MCH: 27.6 pg (ref 26.0–34.0)
MCHC: 31.1 g/dL (ref 30.0–36.0)
MCV: 88.7 fL (ref 80.0–100.0)
Monocytes Absolute: 0.6 10*3/uL (ref 0.1–1.0)
Monocytes Relative: 7 %
Neutro Abs: 6.7 10*3/uL (ref 1.7–7.7)
Neutrophils Relative %: 81 %
Platelet Count: 244 10*3/uL (ref 150–400)
RBC: 3.99 MIL/uL (ref 3.87–5.11)
RDW: 17.9 % — ABNORMAL HIGH (ref 11.5–15.5)
WBC Count: 8.2 10*3/uL (ref 4.0–10.5)
nRBC: 0 % (ref 0.0–0.2)

## 2023-01-03 LAB — ATN PROFILE
A -- Beta-amyloid 42/40 Ratio: 0.094 — ABNORMAL LOW (ref >0.102)
Beta-amyloid 40: 436.77 pg/mL
Beta-amyloid 42: 40.89 pg/mL
N -- NfL, Plasma: 10.3 pg/mL — ABNORMAL HIGH (ref 0.00–7.64)
T -- p-tau181: 1.62 pg/mL — ABNORMAL HIGH (ref 0.00–0.97)

## 2023-01-03 LAB — SAMPLE TO BLOOD BANK

## 2023-01-03 LAB — VITAMIN B12: Vitamin B-12: 1004 pg/mL — ABNORMAL HIGH (ref 180–914)

## 2023-01-03 LAB — VITAMIN B1: Thiamine: 104.2 nmol/L (ref 66.5–200.0)

## 2023-01-03 LAB — FERRITIN: Ferritin: 61 ng/mL (ref 11–307)

## 2023-01-03 NOTE — Progress Notes (Signed)
Pt here for aranesp injection.  Hgb 11 today.  Meets parameters and no need for injection.  Labs printed.

## 2023-01-08 ENCOUNTER — Telehealth: Payer: Self-pay | Admitting: Neurology

## 2023-01-08 NOTE — Telephone Encounter (Signed)
[facility]here Berkley Harvey: [phone] exp. 01/08/23-03/09/23 sent to [facility] [phone]

## 2023-01-08 NOTE — Telephone Encounter (Signed)
Referral for neuropsychology fax to [facility]. Phone:[phone], Fax: [phone]

## 2023-01-10 DIAGNOSIS — N185 Chronic kidney disease, stage 5: Secondary | ICD-10-CM | POA: Diagnosis not present

## 2023-01-10 DIAGNOSIS — M47816 Spondylosis without myelopathy or radiculopathy, lumbar region: Secondary | ICD-10-CM | POA: Diagnosis not present

## 2023-01-15 ENCOUNTER — Telehealth: Payer: Self-pay | Admitting: Hematology

## 2023-01-16 ENCOUNTER — Other Ambulatory Visit (HOSPITAL_COMMUNITY): Payer: Self-pay

## 2023-01-17 ENCOUNTER — Encounter: Payer: Self-pay | Admitting: Hematology

## 2023-01-17 ENCOUNTER — Inpatient Hospital Stay: Payer: Medicare HMO

## 2023-01-17 ENCOUNTER — Other Ambulatory Visit: Payer: Self-pay

## 2023-01-17 VITALS — BP 143/57 | HR 77 | Temp 97.8°F | Resp 18

## 2023-01-17 DIAGNOSIS — D631 Anemia in chronic kidney disease: Secondary | ICD-10-CM

## 2023-01-17 DIAGNOSIS — D509 Iron deficiency anemia, unspecified: Secondary | ICD-10-CM

## 2023-01-17 DIAGNOSIS — E538 Deficiency of other specified B group vitamins: Secondary | ICD-10-CM | POA: Diagnosis not present

## 2023-01-17 DIAGNOSIS — I5032 Chronic diastolic (congestive) heart failure: Secondary | ICD-10-CM | POA: Diagnosis not present

## 2023-01-17 DIAGNOSIS — E1122 Type 2 diabetes mellitus with diabetic chronic kidney disease: Secondary | ICD-10-CM | POA: Diagnosis not present

## 2023-01-17 DIAGNOSIS — I130 Hypertensive heart and chronic kidney disease with heart failure and stage 1 through stage 4 chronic kidney disease, or unspecified chronic kidney disease: Secondary | ICD-10-CM | POA: Diagnosis not present

## 2023-01-17 DIAGNOSIS — I823 Embolism and thrombosis of renal vein: Secondary | ICD-10-CM | POA: Diagnosis not present

## 2023-01-17 DIAGNOSIS — N185 Chronic kidney disease, stage 5: Secondary | ICD-10-CM | POA: Diagnosis not present

## 2023-01-17 DIAGNOSIS — N184 Chronic kidney disease, stage 4 (severe): Secondary | ICD-10-CM | POA: Diagnosis not present

## 2023-01-17 DIAGNOSIS — I82451 Acute embolism and thrombosis of right peroneal vein: Secondary | ICD-10-CM | POA: Diagnosis not present

## 2023-01-17 DIAGNOSIS — D464 Refractory anemia, unspecified: Secondary | ICD-10-CM

## 2023-01-17 LAB — CBC WITH DIFFERENTIAL (CANCER CENTER ONLY)
Abs Immature Granulocytes: 0.27 10*3/uL — ABNORMAL HIGH (ref 0.00–0.07)
Basophils Absolute: 0 10*3/uL (ref 0.0–0.1)
Basophils Relative: 0 %
Eosinophils Absolute: 0.1 10*3/uL (ref 0.0–0.5)
Eosinophils Relative: 1 %
HCT: 31.4 % — ABNORMAL LOW (ref 36.0–46.0)
Hemoglobin: 10.2 g/dL — ABNORMAL LOW (ref 12.0–15.0)
Immature Granulocytes: 2 %
Lymphocytes Relative: 9 %
Lymphs Abs: 1 10*3/uL (ref 0.7–4.0)
MCH: 28.3 pg (ref 26.0–34.0)
MCHC: 32.5 g/dL (ref 30.0–36.0)
MCV: 87 fL (ref 80.0–100.0)
Monocytes Absolute: 0.9 10*3/uL (ref 0.1–1.0)
Monocytes Relative: 8 %
Neutro Abs: 8.9 10*3/uL — ABNORMAL HIGH (ref 1.7–7.7)
Neutrophils Relative %: 80 %
Platelet Count: 240 10*3/uL (ref 150–400)
RBC: 3.61 MIL/uL — ABNORMAL LOW (ref 3.87–5.11)
RDW: 16.1 % — ABNORMAL HIGH (ref 11.5–15.5)
WBC Count: 11.1 10*3/uL — ABNORMAL HIGH (ref 4.0–10.5)
nRBC: 0 % (ref 0.0–0.2)

## 2023-01-17 LAB — SAMPLE TO BLOOD BANK

## 2023-01-17 MED ORDER — CYANOCOBALAMIN 1000 MCG/ML IJ SOLN
1000.0000 ug | Freq: Once | INTRAMUSCULAR | Status: AC
  Administered 2023-01-17: 1000 ug via INTRAMUSCULAR
  Filled 2023-01-17: qty 1

## 2023-01-17 MED ORDER — EPOETIN ALFA-EPBX 40000 UNIT/ML IJ SOLN
40000.0000 [IU] | Freq: Once | INTRAMUSCULAR | Status: AC
  Administered 2023-01-17: 40000 [IU] via SUBCUTANEOUS
  Filled 2023-01-17: qty 1

## 2023-01-17 NOTE — Patient Instructions (Signed)

## 2023-01-18 ENCOUNTER — Other Ambulatory Visit (HOSPITAL_COMMUNITY): Payer: Self-pay

## 2023-01-19 ENCOUNTER — Other Ambulatory Visit (HOSPITAL_COMMUNITY): Payer: Self-pay

## 2023-01-21 DIAGNOSIS — M47816 Spondylosis without myelopathy or radiculopathy, lumbar region: Secondary | ICD-10-CM | POA: Diagnosis not present

## 2023-01-28 DIAGNOSIS — M47816 Spondylosis without myelopathy or radiculopathy, lumbar region: Secondary | ICD-10-CM | POA: Diagnosis not present

## 2023-01-31 ENCOUNTER — Other Ambulatory Visit: Payer: Self-pay

## 2023-01-31 ENCOUNTER — Inpatient Hospital Stay: Payer: Medicare HMO | Attending: Hematology

## 2023-01-31 ENCOUNTER — Inpatient Hospital Stay: Payer: Medicare HMO

## 2023-01-31 VITALS — BP 138/61 | HR 65 | Resp 16

## 2023-01-31 DIAGNOSIS — N184 Chronic kidney disease, stage 4 (severe): Secondary | ICD-10-CM | POA: Diagnosis not present

## 2023-01-31 DIAGNOSIS — Z170 Estrogen receptor positive status [ER+]: Secondary | ICD-10-CM

## 2023-01-31 DIAGNOSIS — Z79620 Long term (current) use of immunosuppressive biologic: Secondary | ICD-10-CM | POA: Diagnosis not present

## 2023-01-31 DIAGNOSIS — E538 Deficiency of other specified B group vitamins: Secondary | ICD-10-CM | POA: Diagnosis not present

## 2023-01-31 DIAGNOSIS — D631 Anemia in chronic kidney disease: Secondary | ICD-10-CM | POA: Diagnosis not present

## 2023-01-31 DIAGNOSIS — D509 Iron deficiency anemia, unspecified: Secondary | ICD-10-CM

## 2023-01-31 DIAGNOSIS — D464 Refractory anemia, unspecified: Secondary | ICD-10-CM

## 2023-01-31 LAB — CBC WITH DIFFERENTIAL (CANCER CENTER ONLY)
Abs Immature Granulocytes: 0.08 10*3/uL — ABNORMAL HIGH (ref 0.00–0.07)
Basophils Absolute: 0 10*3/uL (ref 0.0–0.1)
Basophils Relative: 0 %
Eosinophils Absolute: 0 10*3/uL (ref 0.0–0.5)
Eosinophils Relative: 0 %
HCT: 32.9 % — ABNORMAL LOW (ref 36.0–46.0)
Hemoglobin: 10.2 g/dL — ABNORMAL LOW (ref 12.0–15.0)
Immature Granulocytes: 1 %
Lymphocytes Relative: 8 %
Lymphs Abs: 0.8 10*3/uL (ref 0.7–4.0)
MCH: 27.2 pg (ref 26.0–34.0)
MCHC: 31 g/dL (ref 30.0–36.0)
MCV: 87.7 fL (ref 80.0–100.0)
Monocytes Absolute: 0.7 10*3/uL (ref 0.1–1.0)
Monocytes Relative: 7 %
Neutro Abs: 8.4 10*3/uL — ABNORMAL HIGH (ref 1.7–7.7)
Neutrophils Relative %: 84 %
Platelet Count: 251 10*3/uL (ref 150–400)
RBC: 3.75 MIL/uL — ABNORMAL LOW (ref 3.87–5.11)
RDW: 16.7 % — ABNORMAL HIGH (ref 11.5–15.5)
WBC Count: 10 10*3/uL (ref 4.0–10.5)
nRBC: 0 % (ref 0.0–0.2)

## 2023-01-31 LAB — VITAMIN B12: Vitamin B-12: 1098 pg/mL — ABNORMAL HIGH (ref 180–914)

## 2023-01-31 LAB — SAMPLE TO BLOOD BANK

## 2023-01-31 MED ORDER — EPOETIN ALFA-EPBX 40000 UNIT/ML IJ SOLN
40000.0000 [IU] | Freq: Once | INTRAMUSCULAR | Status: AC
  Administered 2023-01-31: 40000 [IU] via SUBCUTANEOUS
  Filled 2023-01-31: qty 1

## 2023-01-31 NOTE — Patient Instructions (Signed)

## 2023-01-31 NOTE — Progress Notes (Signed)
Per Mosetta Putt, MD note, retacrit to be given for Hgb less than 10.5. Hgb 10.2 today. Retacrit to be given.

## 2023-02-02 ENCOUNTER — Other Ambulatory Visit (HOSPITAL_COMMUNITY): Payer: Self-pay

## 2023-02-13 ENCOUNTER — Inpatient Hospital Stay: Payer: Medicare HMO

## 2023-02-13 DIAGNOSIS — D464 Refractory anemia, unspecified: Secondary | ICD-10-CM

## 2023-02-13 DIAGNOSIS — Z79620 Long term (current) use of immunosuppressive biologic: Secondary | ICD-10-CM | POA: Diagnosis not present

## 2023-02-13 DIAGNOSIS — E538 Deficiency of other specified B group vitamins: Secondary | ICD-10-CM | POA: Diagnosis not present

## 2023-02-13 DIAGNOSIS — D631 Anemia in chronic kidney disease: Secondary | ICD-10-CM | POA: Diagnosis not present

## 2023-02-13 DIAGNOSIS — N184 Chronic kidney disease, stage 4 (severe): Secondary | ICD-10-CM | POA: Diagnosis not present

## 2023-02-13 DIAGNOSIS — D509 Iron deficiency anemia, unspecified: Secondary | ICD-10-CM

## 2023-02-13 LAB — CBC WITH DIFFERENTIAL (CANCER CENTER ONLY)
Abs Immature Granulocytes: 0.07 10*3/uL (ref 0.00–0.07)
Basophils Absolute: 0 10*3/uL (ref 0.0–0.1)
Basophils Relative: 1 %
Eosinophils Absolute: 0.1 10*3/uL (ref 0.0–0.5)
Eosinophils Relative: 1 %
HCT: 34 % — ABNORMAL LOW (ref 36.0–46.0)
Hemoglobin: 10.6 g/dL — ABNORMAL LOW (ref 12.0–15.0)
Immature Granulocytes: 1 %
Lymphocytes Relative: 11 %
Lymphs Abs: 0.9 10*3/uL (ref 0.7–4.0)
MCH: 27.6 pg (ref 26.0–34.0)
MCHC: 31.2 g/dL (ref 30.0–36.0)
MCV: 88.5 fL (ref 80.0–100.0)
Monocytes Absolute: 0.7 10*3/uL (ref 0.1–1.0)
Monocytes Relative: 8 %
Neutro Abs: 6.9 10*3/uL (ref 1.7–7.7)
Neutrophils Relative %: 78 %
Platelet Count: 300 10*3/uL (ref 150–400)
RBC: 3.84 MIL/uL — ABNORMAL LOW (ref 3.87–5.11)
RDW: 17.1 % — ABNORMAL HIGH (ref 11.5–15.5)
WBC Count: 8.8 10*3/uL (ref 4.0–10.5)
nRBC: 0 % (ref 0.0–0.2)

## 2023-02-13 LAB — SAMPLE TO BLOOD BANK

## 2023-02-13 NOTE — Progress Notes (Signed)
Per Mosetta Putt, MD note, retacrit to be given for Hgb less than 10.5. Hgb 10.6 today, so retacrit not given. Patient updated and provided copy of CBC results. Patient verbalized understanding and appreciation of update

## 2023-02-15 ENCOUNTER — Other Ambulatory Visit (HOSPITAL_COMMUNITY): Payer: Self-pay

## 2023-02-19 DIAGNOSIS — M47816 Spondylosis without myelopathy or radiculopathy, lumbar region: Secondary | ICD-10-CM | POA: Diagnosis not present

## 2023-02-21 DIAGNOSIS — N185 Chronic kidney disease, stage 5: Secondary | ICD-10-CM | POA: Diagnosis not present

## 2023-02-21 DIAGNOSIS — I823 Embolism and thrombosis of renal vein: Secondary | ICD-10-CM | POA: Diagnosis not present

## 2023-02-21 DIAGNOSIS — I5032 Chronic diastolic (congestive) heart failure: Secondary | ICD-10-CM | POA: Diagnosis not present

## 2023-02-21 DIAGNOSIS — I130 Hypertensive heart and chronic kidney disease with heart failure and stage 1 through stage 4 chronic kidney disease, or unspecified chronic kidney disease: Secondary | ICD-10-CM | POA: Diagnosis not present

## 2023-02-21 DIAGNOSIS — I639 Cerebral infarction, unspecified: Secondary | ICD-10-CM | POA: Diagnosis not present

## 2023-02-21 DIAGNOSIS — E1122 Type 2 diabetes mellitus with diabetic chronic kidney disease: Secondary | ICD-10-CM | POA: Diagnosis not present

## 2023-02-25 ENCOUNTER — Other Ambulatory Visit (HOSPITAL_COMMUNITY): Payer: Self-pay

## 2023-02-27 ENCOUNTER — Other Ambulatory Visit (HOSPITAL_COMMUNITY): Payer: Self-pay

## 2023-02-28 ENCOUNTER — Inpatient Hospital Stay: Payer: Medicare HMO

## 2023-02-28 ENCOUNTER — Inpatient Hospital Stay: Payer: Medicare HMO | Attending: Hematology

## 2023-02-28 ENCOUNTER — Other Ambulatory Visit (HOSPITAL_COMMUNITY): Payer: Self-pay

## 2023-02-28 VITALS — BP 155/63 | HR 71 | Temp 98.0°F | Resp 18

## 2023-02-28 DIAGNOSIS — D631 Anemia in chronic kidney disease: Secondary | ICD-10-CM | POA: Insufficient documentation

## 2023-02-28 DIAGNOSIS — E538 Deficiency of other specified B group vitamins: Secondary | ICD-10-CM | POA: Diagnosis not present

## 2023-02-28 DIAGNOSIS — N184 Chronic kidney disease, stage 4 (severe): Secondary | ICD-10-CM | POA: Diagnosis not present

## 2023-02-28 DIAGNOSIS — D509 Iron deficiency anemia, unspecified: Secondary | ICD-10-CM

## 2023-02-28 DIAGNOSIS — D464 Refractory anemia, unspecified: Secondary | ICD-10-CM

## 2023-02-28 DIAGNOSIS — Z170 Estrogen receptor positive status [ER+]: Secondary | ICD-10-CM

## 2023-02-28 LAB — CBC WITH DIFFERENTIAL (CANCER CENTER ONLY)
Abs Immature Granulocytes: 0.66 10*3/uL — ABNORMAL HIGH (ref 0.00–0.07)
Basophils Absolute: 0.1 10*3/uL (ref 0.0–0.1)
Basophils Relative: 1 %
Eosinophils Absolute: 0 10*3/uL (ref 0.0–0.5)
Eosinophils Relative: 0 %
HCT: 29.9 % — ABNORMAL LOW (ref 36.0–46.0)
Hemoglobin: 9.9 g/dL — ABNORMAL LOW (ref 12.0–15.0)
Immature Granulocytes: 4 %
Lymphocytes Relative: 9 %
Lymphs Abs: 1.4 10*3/uL (ref 0.7–4.0)
MCH: 28.2 pg (ref 26.0–34.0)
MCHC: 33.1 g/dL (ref 30.0–36.0)
MCV: 85.2 fL (ref 80.0–100.0)
Monocytes Absolute: 1 10*3/uL (ref 0.1–1.0)
Monocytes Relative: 6 %
Neutro Abs: 13 10*3/uL — ABNORMAL HIGH (ref 1.7–7.7)
Neutrophils Relative %: 80 %
Platelet Count: 306 10*3/uL (ref 150–400)
RBC: 3.51 MIL/uL — ABNORMAL LOW (ref 3.87–5.11)
RDW: 16.6 % — ABNORMAL HIGH (ref 11.5–15.5)
WBC Count: 16.2 10*3/uL — ABNORMAL HIGH (ref 4.0–10.5)
nRBC: 0 % (ref 0.0–0.2)

## 2023-02-28 LAB — SAMPLE TO BLOOD BANK

## 2023-02-28 LAB — VITAMIN B12: Vitamin B-12: 833 pg/mL (ref 180–914)

## 2023-02-28 MED ORDER — EPOETIN ALFA-EPBX 40000 UNIT/ML IJ SOLN
40000.0000 [IU] | Freq: Once | INTRAMUSCULAR | Status: AC
  Administered 2023-02-28: 40000 [IU] via SUBCUTANEOUS
  Filled 2023-02-28: qty 1

## 2023-02-28 MED ORDER — CYANOCOBALAMIN 1000 MCG/ML IJ SOLN
1000.0000 ug | Freq: Once | INTRAMUSCULAR | Status: AC
  Administered 2023-02-28: 1000 ug via INTRAMUSCULAR
  Filled 2023-02-28: qty 1

## 2023-03-01 ENCOUNTER — Other Ambulatory Visit (HOSPITAL_COMMUNITY): Payer: Self-pay

## 2023-03-01 DIAGNOSIS — M47816 Spondylosis without myelopathy or radiculopathy, lumbar region: Secondary | ICD-10-CM | POA: Diagnosis not present

## 2023-03-05 ENCOUNTER — Other Ambulatory Visit (HOSPITAL_COMMUNITY): Payer: Self-pay

## 2023-03-06 ENCOUNTER — Other Ambulatory Visit (HOSPITAL_COMMUNITY): Payer: Self-pay

## 2023-03-14 ENCOUNTER — Other Ambulatory Visit: Payer: Self-pay

## 2023-03-14 ENCOUNTER — Inpatient Hospital Stay: Payer: Medicare HMO

## 2023-03-14 ENCOUNTER — Inpatient Hospital Stay: Payer: Medicare HMO | Admitting: Nurse Practitioner

## 2023-03-14 VITALS — BP 163/64 | HR 94 | Temp 98.0°F | Resp 16

## 2023-03-14 DIAGNOSIS — D509 Iron deficiency anemia, unspecified: Secondary | ICD-10-CM

## 2023-03-14 DIAGNOSIS — D631 Anemia in chronic kidney disease: Secondary | ICD-10-CM

## 2023-03-14 DIAGNOSIS — E538 Deficiency of other specified B group vitamins: Secondary | ICD-10-CM | POA: Diagnosis not present

## 2023-03-14 DIAGNOSIS — N184 Chronic kidney disease, stage 4 (severe): Secondary | ICD-10-CM | POA: Diagnosis not present

## 2023-03-14 DIAGNOSIS — D464 Refractory anemia, unspecified: Secondary | ICD-10-CM

## 2023-03-14 LAB — CBC WITH DIFFERENTIAL (CANCER CENTER ONLY)
Abs Immature Granulocytes: 0.13 10*3/uL — ABNORMAL HIGH (ref 0.00–0.07)
Basophils Absolute: 0.1 10*3/uL (ref 0.0–0.1)
Basophils Relative: 0 %
Eosinophils Absolute: 0 10*3/uL (ref 0.0–0.5)
Eosinophils Relative: 0 %
HCT: 31.9 % — ABNORMAL LOW (ref 36.0–46.0)
Hemoglobin: 10.2 g/dL — ABNORMAL LOW (ref 12.0–15.0)
Immature Granulocytes: 1 %
Lymphocytes Relative: 9 %
Lymphs Abs: 1.1 10*3/uL (ref 0.7–4.0)
MCH: 27.6 pg (ref 26.0–34.0)
MCHC: 32 g/dL (ref 30.0–36.0)
MCV: 86.2 fL (ref 80.0–100.0)
Monocytes Absolute: 0.7 10*3/uL (ref 0.1–1.0)
Monocytes Relative: 6 %
Neutro Abs: 9.9 10*3/uL — ABNORMAL HIGH (ref 1.7–7.7)
Neutrophils Relative %: 84 %
Platelet Count: 252 10*3/uL (ref 150–400)
RBC: 3.7 MIL/uL — ABNORMAL LOW (ref 3.87–5.11)
RDW: 18.6 % — ABNORMAL HIGH (ref 11.5–15.5)
WBC Count: 11.9 10*3/uL — ABNORMAL HIGH (ref 4.0–10.5)
nRBC: 0 % (ref 0.0–0.2)

## 2023-03-14 LAB — SAMPLE TO BLOOD BANK

## 2023-03-14 MED ORDER — EPOETIN ALFA-EPBX 40000 UNIT/ML IJ SOLN
40000.0000 [IU] | Freq: Once | INTRAMUSCULAR | Status: AC
Start: 2023-03-14 — End: 2023-03-14
  Administered 2023-03-14: 40000 [IU] via SUBCUTANEOUS
  Filled 2023-03-14: qty 1

## 2023-03-14 NOTE — Progress Notes (Signed)
Erythropoietin injection today. Continue labs and injections as scheduled

## 2023-03-16 DIAGNOSIS — M47816 Spondylosis without myelopathy or radiculopathy, lumbar region: Secondary | ICD-10-CM | POA: Diagnosis not present

## 2023-03-16 DIAGNOSIS — M25551 Pain in right hip: Secondary | ICD-10-CM | POA: Diagnosis not present

## 2023-03-25 ENCOUNTER — Encounter: Payer: Self-pay | Admitting: Hematology

## 2023-03-25 NOTE — Telephone Encounter (Signed)
 error

## 2023-04-03 ENCOUNTER — Other Ambulatory Visit: Payer: Self-pay

## 2023-04-03 DIAGNOSIS — D509 Iron deficiency anemia, unspecified: Secondary | ICD-10-CM

## 2023-04-03 DIAGNOSIS — D631 Anemia in chronic kidney disease: Secondary | ICD-10-CM

## 2023-04-04 ENCOUNTER — Inpatient Hospital Stay: Payer: Medicare HMO

## 2023-04-04 ENCOUNTER — Inpatient Hospital Stay: Payer: Medicare HMO | Attending: Hematology

## 2023-04-04 VITALS — BP 138/59 | HR 72 | Temp 98.5°F | Resp 18

## 2023-04-04 DIAGNOSIS — N184 Chronic kidney disease, stage 4 (severe): Secondary | ICD-10-CM | POA: Insufficient documentation

## 2023-04-04 DIAGNOSIS — D509 Iron deficiency anemia, unspecified: Secondary | ICD-10-CM

## 2023-04-04 DIAGNOSIS — D631 Anemia in chronic kidney disease: Secondary | ICD-10-CM | POA: Diagnosis not present

## 2023-04-04 DIAGNOSIS — E538 Deficiency of other specified B group vitamins: Secondary | ICD-10-CM | POA: Insufficient documentation

## 2023-04-04 LAB — CBC WITH DIFFERENTIAL (CANCER CENTER ONLY)
Abs Immature Granulocytes: 0.27 10*3/uL — ABNORMAL HIGH (ref 0.00–0.07)
Basophils Absolute: 0.1 10*3/uL (ref 0.0–0.1)
Basophils Relative: 0 %
Eosinophils Absolute: 0 10*3/uL (ref 0.0–0.5)
Eosinophils Relative: 0 %
HCT: 31.6 % — ABNORMAL LOW (ref 36.0–46.0)
Hemoglobin: 9.9 g/dL — ABNORMAL LOW (ref 12.0–15.0)
Immature Granulocytes: 2 %
Lymphocytes Relative: 8 %
Lymphs Abs: 1.1 10*3/uL (ref 0.7–4.0)
MCH: 26.9 pg (ref 26.0–34.0)
MCHC: 31.3 g/dL (ref 30.0–36.0)
MCV: 85.9 fL (ref 80.0–100.0)
Monocytes Absolute: 0.9 10*3/uL (ref 0.1–1.0)
Monocytes Relative: 7 %
Neutro Abs: 11 10*3/uL — ABNORMAL HIGH (ref 1.7–7.7)
Neutrophils Relative %: 83 %
Platelet Count: 231 10*3/uL (ref 150–400)
RBC: 3.68 MIL/uL — ABNORMAL LOW (ref 3.87–5.11)
RDW: 18 % — ABNORMAL HIGH (ref 11.5–15.5)
WBC Count: 13.3 10*3/uL — ABNORMAL HIGH (ref 4.0–10.5)
nRBC: 0 % (ref 0.0–0.2)

## 2023-04-04 LAB — SAMPLE TO BLOOD BANK

## 2023-04-04 LAB — VITAMIN B12: Vitamin B-12: 700 pg/mL (ref 180–914)

## 2023-04-04 MED ORDER — EPOETIN ALFA-EPBX 40000 UNIT/ML IJ SOLN
40000.0000 [IU] | Freq: Once | INTRAMUSCULAR | Status: AC
  Administered 2023-04-04: 40000 [IU] via SUBCUTANEOUS
  Filled 2023-04-04: qty 1

## 2023-04-05 LAB — FERRITIN: Ferritin: 153 ng/mL (ref 11–307)

## 2023-04-06 ENCOUNTER — Other Ambulatory Visit (HOSPITAL_COMMUNITY): Payer: Self-pay

## 2023-04-07 ENCOUNTER — Other Ambulatory Visit: Payer: Self-pay

## 2023-04-09 ENCOUNTER — Other Ambulatory Visit (HOSPITAL_COMMUNITY): Payer: Self-pay

## 2023-04-09 DIAGNOSIS — M48061 Spinal stenosis, lumbar region without neurogenic claudication: Secondary | ICD-10-CM | POA: Diagnosis not present

## 2023-04-09 MED ORDER — FUROSEMIDE 40 MG PO TABS
40.0000 mg | ORAL_TABLET | Freq: Every day | ORAL | 2 refills | Status: DC
  Filled 2023-04-09 – 2023-04-11 (×2): qty 90, 90d supply, fill #0

## 2023-04-11 ENCOUNTER — Other Ambulatory Visit: Payer: Self-pay

## 2023-04-16 ENCOUNTER — Other Ambulatory Visit: Payer: Self-pay

## 2023-04-16 ENCOUNTER — Emergency Department (HOSPITAL_BASED_OUTPATIENT_CLINIC_OR_DEPARTMENT_OTHER)
Admission: EM | Admit: 2023-04-16 | Discharge: 2023-04-16 | Disposition: A | Discharge status: Home / Self Care | Payer: Medicare HMO | Attending: Emergency Medicine | Admitting: Emergency Medicine

## 2023-04-16 ENCOUNTER — Emergency Department (HOSPITAL_BASED_OUTPATIENT_CLINIC_OR_DEPARTMENT_OTHER): Payer: Medicare HMO

## 2023-04-16 ENCOUNTER — Encounter (HOSPITAL_BASED_OUTPATIENT_CLINIC_OR_DEPARTMENT_OTHER): Payer: Self-pay | Admitting: Emergency Medicine

## 2023-04-16 DIAGNOSIS — N184 Chronic kidney disease, stage 4 (severe): Secondary | ICD-10-CM | POA: Insufficient documentation

## 2023-04-16 DIAGNOSIS — R1111 Vomiting without nausea: Secondary | ICD-10-CM | POA: Diagnosis not present

## 2023-04-16 DIAGNOSIS — Z8673 Personal history of transient ischemic attack (TIA), and cerebral infarction without residual deficits: Secondary | ICD-10-CM | POA: Diagnosis not present

## 2023-04-16 DIAGNOSIS — R112 Nausea with vomiting, unspecified: Secondary | ICD-10-CM | POA: Diagnosis not present

## 2023-04-16 DIAGNOSIS — I509 Heart failure, unspecified: Secondary | ICD-10-CM | POA: Insufficient documentation

## 2023-04-16 DIAGNOSIS — I130 Hypertensive heart and chronic kidney disease with heart failure and stage 1 through stage 4 chronic kidney disease, or unspecified chronic kidney disease: Secondary | ICD-10-CM | POA: Insufficient documentation

## 2023-04-16 DIAGNOSIS — Z03818 Encounter for observation for suspected exposure to other biological agents ruled out: Secondary | ICD-10-CM | POA: Diagnosis not present

## 2023-04-16 DIAGNOSIS — I10 Essential (primary) hypertension: Secondary | ICD-10-CM | POA: Diagnosis not present

## 2023-04-16 DIAGNOSIS — Z9049 Acquired absence of other specified parts of digestive tract: Secondary | ICD-10-CM | POA: Diagnosis not present

## 2023-04-16 DIAGNOSIS — I2510 Atherosclerotic heart disease of native coronary artery without angina pectoris: Secondary | ICD-10-CM | POA: Insufficient documentation

## 2023-04-16 DIAGNOSIS — K5730 Diverticulosis of large intestine without perforation or abscess without bleeding: Secondary | ICD-10-CM | POA: Diagnosis not present

## 2023-04-16 DIAGNOSIS — Z853 Personal history of malignant neoplasm of breast: Secondary | ICD-10-CM | POA: Diagnosis not present

## 2023-04-16 DIAGNOSIS — R197 Diarrhea, unspecified: Secondary | ICD-10-CM | POA: Diagnosis not present

## 2023-04-16 DIAGNOSIS — E119 Type 2 diabetes mellitus without complications: Secondary | ICD-10-CM | POA: Diagnosis not present

## 2023-04-16 DIAGNOSIS — R109 Unspecified abdominal pain: Secondary | ICD-10-CM | POA: Diagnosis not present

## 2023-04-16 DIAGNOSIS — K449 Diaphragmatic hernia without obstruction or gangrene: Secondary | ICD-10-CM | POA: Diagnosis not present

## 2023-04-16 LAB — COMPREHENSIVE METABOLIC PANEL
ALT: 13 U/L (ref 0–44)
AST: 13 U/L — ABNORMAL LOW (ref 15–41)
Albumin: 3.6 g/dL (ref 3.5–5.0)
Alkaline Phosphatase: 55 U/L (ref 38–126)
Anion gap: 15 (ref 5–15)
BUN: 91 mg/dL — ABNORMAL HIGH (ref 8–23)
CO2: 18 mmol/L — ABNORMAL LOW (ref 22–32)
Calcium: 7.9 mg/dL — ABNORMAL LOW (ref 8.9–10.3)
Chloride: 109 mmol/L (ref 98–111)
Creatinine, Ser: 4.14 mg/dL — ABNORMAL HIGH (ref 0.44–1.00)
GFR, Estimated: 11 mL/min — ABNORMAL LOW (ref >60)
Glucose, Bld: 169 mg/dL — ABNORMAL HIGH (ref 70–99)
Potassium: 3.2 mmol/L — ABNORMAL LOW (ref 3.5–5.1)
Sodium: 142 mmol/L (ref 135–145)
Total Bilirubin: 0.5 mg/dL (ref 0.0–1.2)
Total Protein: 7.1 g/dL (ref 6.5–8.1)

## 2023-04-16 LAB — LIPASE, BLOOD: Lipase: 14 U/L (ref 11–51)

## 2023-04-16 MED ORDER — LACTATED RINGERS IV BOLUS
1000.0000 mL | Freq: Once | INTRAVENOUS | Status: AC
  Administered 2023-04-16: 1000 mL via INTRAVENOUS

## 2023-04-16 NOTE — ED Notes (Signed)
Patient began drinking oral contrast at this time.

## 2023-04-16 NOTE — ED Notes (Signed)
ED Provider at bedside.

## 2023-04-16 NOTE — ED Provider Notes (Signed)
[place] EMERGENCY DEPARTMENT AT [facility] Provider Note   CSN: [id number] Arrival date & time: 04/16/23  1921     History Chief Complaint  Patient presents with   Abdominal Pain    HPI Chancie [place] Fulwider is a 78 y.o. female presenting for a chief complaint of abdominal pain.  She has an exquisitely complex history and is presenting with her daughter who is a Publishing rights manager.  Has a history of recurrent SBO's, encephalopathy, diabetes, hypertension, CKD stage IV, heart failure, CAD, diabetes, breast cancer, CVA  Patient had nausea vomiting and diarrhea earlier today, brought in by her daughter because of a history of small bowel obstructions presenting as this.  They called the general surgeon who follows the patient in the outpatient setting recommend she come in and have a CT scan with oral contrast. Her symptoms are already improving on arrival but given her history family proceeded to emergency room.    Patient's recorded medical, surgical, social, medication list and allergies were reviewed in the Snapshot window as part of the initial history.   Review of Systems   Review of Systems  Constitutional:  Negative for chills and fever.  HENT:  Negative for ear pain and sore throat.   Eyes:  Negative for pain and visual disturbance.  Respiratory:  Negative for cough and shortness of breath.   Cardiovascular:  Negative for chest pain and palpitations.  Gastrointestinal:  Positive for abdominal pain, nausea and vomiting.  Genitourinary:  Negative for dysuria and hematuria.  Musculoskeletal:  Negative for arthralgias and back pain.  Skin:  Negative for color change and rash.  Neurological:  Negative for seizures and syncope.  All other systems reviewed and are negative.   Physical Exam Updated Vital Signs BP (!) 165/82   Pulse 89   Temp 98.4 F (36.9 C) (Oral)   Resp 16   SpO2 98%  Physical Exam Vitals and nursing note reviewed.  Constitutional:       General: She is not in acute distress.    Appearance: She is well-developed.  HENT:     Head: Normocephalic and atraumatic.  Eyes:     Conjunctiva/sclera: Conjunctivae normal.  Cardiovascular:     Rate and Rhythm: Normal rate and regular rhythm.     Heart sounds: No murmur heard. Pulmonary:     Effort: Pulmonary effort is normal. No respiratory distress.     Breath sounds: Normal breath sounds.  Abdominal:     General: There is no distension.     Palpations: Abdomen is soft.     Tenderness: There is abdominal tenderness. There is no right CVA tenderness, left CVA tenderness or guarding.  Musculoskeletal:        General: No swelling or tenderness. Normal range of motion.     Cervical back: Neck supple.  Skin:    General: Skin is warm and dry.  Neurological:     General: No focal deficit present.     Mental Status: She is alert and oriented to person, place, and time. Mental status is at baseline.     Cranial Nerves: No cranial nerve deficit.      ED Course/ Medical Decision Making/ A&P    Procedures Procedures   Medications Ordered in ED Medications  lactated ringers bolus 1,000 mL (1,000 mLs Intravenous New Bag/Given 04/16/23 2216)   Medical Decision Making:   Mystique [place] Topor is a 78 y.o. female who presented to the ED today with abdominal pain, detailed above.  Patient placed on continuous vitals and telemetry monitoring while in ED which was reviewed periodically.  Complete initial physical exam performed, notably the patient  was HDS in NAD.     Reviewed and confirmed nursing documentation for past medical history, family history, social history.    Initial Assessment:   With the patient's presentation of abdominal pain, most likely diagnosis is nonspecific etiology. Other diagnoses were considered including (but not limited to) gastroenteritis, colitis, small bowel obstruction, appendicitis, cholecystitis, pancreatitis, nephrolithiasis, UTI, pyleonephritis,  ruptured ectopic pregnancy, PID, ovarian torsion. These are considered less likely due to history of present illness and physical exam findings.   This is most consistent with an acute life/limb threatening illness complicated by underlying chronic conditions.   Initial Plan:  CBC/CMP to evaluate for underlying infectious/metabolic etiology for patient's abdominal pain  Lipase to evaluate for pancreatitis  EKG to evaluate for cardiac source of pain  CTAB/Pelvis with PO contrast to evaluate for structural/surgical etiology of patients' severe abdominal pain.  Urinalysis and repeat physical assessment to evaluate for UTI/Pyelonpehritis  Empiric management of symptoms with escalating pain control and antiemetics as needed.   Initial Study Results:   Laboratory  All laboratory results reviewed without evidence of clinically relevant pathology.    EKG EKG was reviewed independently. Rate, rhythm, axis, intervals all examined and without medically relevant abnormality. ST segments without concerns for elevations.    Radiology All images reviewed independently. Agree with radiology report at this time.   CT ABDOMEN PELVIS WO CONTRAST Result Date: 04/16/2023 CLINICAL DATA:  Acute abdominal pain EXAM: CT ABDOMEN AND PELVIS WITHOUT CONTRAST TECHNIQUE: Multidetector CT imaging of the abdomen and pelvis was performed following the standard protocol without IV contrast. RADIATION DOSE REDUCTION: This exam was performed according to the departmental dose-optimization program which includes automated exposure control, adjustment of the mA and/or kV according to patient size and/or use of iterative reconstruction technique. COMPARISON:  09/22/2022 FINDINGS: Lower chest: No acute abnormality. Hepatobiliary: No focal liver abnormality is seen. Status post cholecystectomy. No biliary dilatation. Pancreas: Unremarkable. No pancreatic ductal dilatation or surrounding inflammatory changes. Spleen: Normal in size  without focal abnormality. Adrenals/Urinary Tract: Adrenal glands are within normal limits. Kidneys show bilateral renal cystic change stable from the prior exam. No further follow-up is recommended. No renal calculi or obstructive changes are seen. The bladder is partially distended. Stomach/Bowel: Scattered diverticular change of the colon is noted without evidence of diverticulitis. Postsurgical changes are noted in the ascending colon with ileocolonic anastomosis. The anastomosis is patent. No obstructive changes are seen. The stomach is within normal limits with the exception of a sliding-type hiatal hernia. Vascular/Lymphatic: Aortic atherosclerosis. No enlarged abdominal or pelvic lymph nodes. Reproductive: Status post hysterectomy. No adnexal masses. Other: No abdominal wall hernia or abnormality. No abdominopelvic ascites. Musculoskeletal: No acute or significant osseous findings. IMPRESSION: Postsurgical changes in the right colon without complicating factors. Small sliding-type hiatal hernia. Diverticulosis without diverticulitis. Electronically Signed   By: Alcide Clever M.D.   On: 04/16/2023 22:11   Final Reassessment and Plan:   Patient's history of present illness and physical exam findings are not consistent with any acute findings.  She does appear mildly dehydrated.  Likely mild gastroenteritis. Treated with IV fluids, antiemetics and able to tolerate p.o. intake at this time.  Shared medical decision making with daughter who is medical and background at bedside. OfferedAdmission for further care and management given patient's very complicated background.  However they have already arranged for follow-up of her  labs tomorrow morning for reassessment.  No challenge in emergency department was successful and patient feels comfortable with outpatient care management overnight tonight.  Disposition:  I have considered need for hospitalization, however, considering all of the above, I believe  this patient is stable for discharge at this time.  Patient/family educated about specific return precautions for given chief complaint and symptoms.  Patient/family educated about follow-up with PCP.     Patient/family expressed understanding of return precautions and need for follow-up. Patient spoken to regarding all imaging and laboratory results and appropriate follow up for these results. All education provided in verbal form with additional information in written form. Time was allowed for answering of patient questions. Patient discharged.    Emergency Department Medication Summary:   Medications  lactated ringers bolus 1,000 mL (1,000 mLs Intravenous New Bag/Given 04/16/23 2216)         Clinical Impression:  1. Vomiting without nausea, unspecified vomiting type      Data Unavailable   Final Clinical Impression(s) / ED Diagnoses Final diagnoses:  Vomiting without nausea, unspecified vomiting type    Rx / DC Orders ED Discharge Orders     None         Glyn Ade, MD 04/16/23 2303

## 2023-04-16 NOTE — ED Triage Notes (Addendum)
Sudden onset emesis starting last night-abd pain-hx obstructions, Crohn's, IBS. Last BM yesterday.   [facility] labs seen in chart from this morning- Swabs negative. CMP, CBC available.

## 2023-04-17 ENCOUNTER — Other Ambulatory Visit: Payer: Self-pay

## 2023-04-17 DIAGNOSIS — D509 Iron deficiency anemia, unspecified: Secondary | ICD-10-CM

## 2023-04-17 DIAGNOSIS — M5416 Radiculopathy, lumbar region: Secondary | ICD-10-CM | POA: Diagnosis not present

## 2023-04-17 DIAGNOSIS — C50311 Malignant neoplasm of lower-inner quadrant of right female breast: Secondary | ICD-10-CM

## 2023-04-17 DIAGNOSIS — D518 Other vitamin B12 deficiency anemias: Secondary | ICD-10-CM

## 2023-04-18 ENCOUNTER — Inpatient Hospital Stay: Payer: Medicare HMO

## 2023-04-18 ENCOUNTER — Other Ambulatory Visit (HOSPITAL_COMMUNITY): Payer: Self-pay

## 2023-04-18 VITALS — BP 179/70 | HR 87 | Temp 98.2°F | Resp 16

## 2023-04-18 DIAGNOSIS — D509 Iron deficiency anemia, unspecified: Secondary | ICD-10-CM

## 2023-04-18 DIAGNOSIS — C50311 Malignant neoplasm of lower-inner quadrant of right female breast: Secondary | ICD-10-CM

## 2023-04-18 DIAGNOSIS — E538 Deficiency of other specified B group vitamins: Secondary | ICD-10-CM

## 2023-04-18 DIAGNOSIS — N184 Chronic kidney disease, stage 4 (severe): Secondary | ICD-10-CM | POA: Diagnosis not present

## 2023-04-18 DIAGNOSIS — D631 Anemia in chronic kidney disease: Secondary | ICD-10-CM | POA: Diagnosis not present

## 2023-04-18 DIAGNOSIS — D518 Other vitamin B12 deficiency anemias: Secondary | ICD-10-CM

## 2023-04-18 LAB — CBC WITH DIFFERENTIAL (CANCER CENTER ONLY)
Abs Immature Granulocytes: 0.17 10*3/uL — ABNORMAL HIGH (ref 0.00–0.07)
Basophils Absolute: 0 10*3/uL (ref 0.0–0.1)
Basophils Relative: 0 %
Eosinophils Absolute: 0 10*3/uL (ref 0.0–0.5)
Eosinophils Relative: 0 %
HCT: 29.9 % — ABNORMAL LOW (ref 36.0–46.0)
Hemoglobin: 9.4 g/dL — ABNORMAL LOW (ref 12.0–15.0)
Immature Granulocytes: 1 %
Lymphocytes Relative: 4 %
Lymphs Abs: 0.8 10*3/uL (ref 0.7–4.0)
MCH: 27.2 pg (ref 26.0–34.0)
MCHC: 31.4 g/dL (ref 30.0–36.0)
MCV: 86.4 fL (ref 80.0–100.0)
Monocytes Absolute: 0.9 10*3/uL (ref 0.1–1.0)
Monocytes Relative: 5 %
Neutro Abs: 16.2 10*3/uL — ABNORMAL HIGH (ref 1.7–7.7)
Neutrophils Relative %: 90 %
Platelet Count: 235 10*3/uL (ref 150–400)
RBC: 3.46 MIL/uL — ABNORMAL LOW (ref 3.87–5.11)
RDW: 19.4 % — ABNORMAL HIGH (ref 11.5–15.5)
WBC Count: 18.1 10*3/uL — ABNORMAL HIGH (ref 4.0–10.5)
nRBC: 0 % (ref 0.0–0.2)

## 2023-04-18 LAB — SAMPLE TO BLOOD BANK

## 2023-04-18 LAB — VITAMIN B12: Vitamin B-12: 835 pg/mL (ref 180–914)

## 2023-04-18 MED ORDER — EPOETIN ALFA-EPBX 40000 UNIT/ML IJ SOLN
40000.0000 [IU] | Freq: Once | INTRAMUSCULAR | Status: AC
  Administered 2023-04-18: 40000 [IU] via SUBCUTANEOUS
  Filled 2023-04-18: qty 1

## 2023-04-18 MED ORDER — CYANOCOBALAMIN 1000 MCG/ML IJ SOLN
1000.0000 ug | Freq: Once | INTRAMUSCULAR | Status: AC
  Administered 2023-04-18: 1000 ug via INTRAMUSCULAR
  Filled 2023-04-18: qty 1

## 2023-04-19 LAB — FERRITIN: Ferritin: 226 ng/mL (ref 11–307)

## 2023-04-29 DIAGNOSIS — I700 Atherosclerosis of aorta: Secondary | ICD-10-CM | POA: Diagnosis not present

## 2023-04-29 DIAGNOSIS — R413 Other amnesia: Secondary | ICD-10-CM | POA: Diagnosis not present

## 2023-04-29 DIAGNOSIS — E7800 Pure hypercholesterolemia, unspecified: Secondary | ICD-10-CM | POA: Diagnosis not present

## 2023-04-29 DIAGNOSIS — I823 Embolism and thrombosis of renal vein: Secondary | ICD-10-CM | POA: Diagnosis not present

## 2023-04-29 DIAGNOSIS — E1169 Type 2 diabetes mellitus with other specified complication: Secondary | ICD-10-CM | POA: Diagnosis not present

## 2023-04-29 DIAGNOSIS — Z0000 Encounter for general adult medical examination without abnormal findings: Secondary | ICD-10-CM | POA: Diagnosis not present

## 2023-04-29 DIAGNOSIS — M79601 Pain in right arm: Secondary | ICD-10-CM | POA: Diagnosis not present

## 2023-04-29 DIAGNOSIS — N185 Chronic kidney disease, stage 5: Secondary | ICD-10-CM | POA: Diagnosis not present

## 2023-04-29 DIAGNOSIS — I10 Essential (primary) hypertension: Secondary | ICD-10-CM | POA: Diagnosis not present

## 2023-04-30 ENCOUNTER — Other Ambulatory Visit: Payer: Self-pay | Admitting: Internal Medicine

## 2023-04-30 ENCOUNTER — Ambulatory Visit
Admission: RE | Admit: 2023-04-30 | Discharge: 2023-04-30 | Disposition: A | Discharge status: Home / Self Care | Payer: Medicare HMO | Source: Ambulatory Visit | Attending: Internal Medicine | Admitting: Internal Medicine

## 2023-04-30 DIAGNOSIS — M25521 Pain in right elbow: Secondary | ICD-10-CM | POA: Diagnosis not present

## 2023-04-30 DIAGNOSIS — M79601 Pain in right arm: Secondary | ICD-10-CM

## 2023-04-30 DIAGNOSIS — M7989 Other specified soft tissue disorders: Secondary | ICD-10-CM | POA: Diagnosis not present

## 2023-05-02 ENCOUNTER — Inpatient Hospital Stay: Payer: Medicare HMO

## 2023-05-02 ENCOUNTER — Telehealth: Payer: Self-pay | Admitting: Nurse Practitioner

## 2023-05-02 ENCOUNTER — Inpatient Hospital Stay: Payer: Medicare HMO | Admitting: Hematology

## 2023-05-02 DIAGNOSIS — M48061 Spinal stenosis, lumbar region without neurogenic claudication: Secondary | ICD-10-CM | POA: Diagnosis not present

## 2023-05-02 DIAGNOSIS — M5416 Radiculopathy, lumbar region: Secondary | ICD-10-CM | POA: Diagnosis not present

## 2023-05-02 NOTE — Assessment & Plan Note (Deleted)
 invasive ductal carcinoma with DCIS, [id number], stage IA,   ER+ and PR +, HER 2 negative -diagnosed 02/2013, s/p right lumpectomy, adjuvant radiation and Anastrozole for 4 years until 07/2017.  Anastrozole was stopped after she developed septic shock from pneumonia and a cardiac arrest, and we decided not restart due to the risk of cardiac toxicity. -Her 07/11/20 Mammogram showed suspicious right breast mass. She underwent breast biopsy on 07/28/20 which showed fibroadipose and necrosis, no malignancy. This area is palpable on exam today. We will continue to monitor. -she is clinically doing well.

## 2023-05-02 NOTE — Assessment & Plan Note (Deleted)
 Started receiving monthly B12 injections in our clinic on 05/04/19, currently on every 6 weeks

## 2023-05-02 NOTE — Telephone Encounter (Signed)
[name] called on behalf of patient due to them being sick, patient's daughter is aware of cancelled appointment and also is aware of rescheduled appointment times/dates for future appointments

## 2023-05-02 NOTE — Assessment & Plan Note (Deleted)
 -  Her insurance did not approve Aranesp so she was switched to Retacrit every 2 weeks on 11/14/18. Goal is Hg 9-11. Due to worsened anemia, her dose has been increased, currently at 40,000 every 4 weeks.

## 2023-05-05 ENCOUNTER — Other Ambulatory Visit: Payer: Self-pay

## 2023-05-05 ENCOUNTER — Emergency Department (HOSPITAL_BASED_OUTPATIENT_CLINIC_OR_DEPARTMENT_OTHER): Payer: Medicare HMO | Admitting: Radiology

## 2023-05-05 ENCOUNTER — Encounter (HOSPITAL_BASED_OUTPATIENT_CLINIC_OR_DEPARTMENT_OTHER): Payer: Self-pay

## 2023-05-05 ENCOUNTER — Emergency Department (HOSPITAL_BASED_OUTPATIENT_CLINIC_OR_DEPARTMENT_OTHER): Payer: Medicare HMO

## 2023-05-05 ENCOUNTER — Emergency Department (HOSPITAL_BASED_OUTPATIENT_CLINIC_OR_DEPARTMENT_OTHER)
Admission: EM | Admit: 2023-05-05 | Discharge: 2023-05-05 | Disposition: A | Discharge status: Home / Self Care | Payer: Medicare HMO | Source: Home / Self Care | Attending: Emergency Medicine | Admitting: Emergency Medicine

## 2023-05-05 DIAGNOSIS — R1310 Dysphagia, unspecified: Secondary | ICD-10-CM | POA: Diagnosis not present

## 2023-05-05 DIAGNOSIS — Z86718 Personal history of other venous thrombosis and embolism: Secondary | ICD-10-CM | POA: Diagnosis not present

## 2023-05-05 DIAGNOSIS — M79605 Pain in left leg: Secondary | ICD-10-CM | POA: Diagnosis not present

## 2023-05-05 DIAGNOSIS — E876 Hypokalemia: Secondary | ICD-10-CM | POA: Diagnosis not present

## 2023-05-05 DIAGNOSIS — F32A Depression, unspecified: Secondary | ICD-10-CM | POA: Diagnosis present

## 2023-05-05 DIAGNOSIS — E119 Type 2 diabetes mellitus without complications: Secondary | ICD-10-CM | POA: Diagnosis not present

## 2023-05-05 DIAGNOSIS — R197 Diarrhea, unspecified: Secondary | ICD-10-CM | POA: Diagnosis not present

## 2023-05-05 DIAGNOSIS — E860 Dehydration: Secondary | ICD-10-CM | POA: Diagnosis not present

## 2023-05-05 DIAGNOSIS — Z923 Personal history of irradiation: Secondary | ICD-10-CM | POA: Diagnosis not present

## 2023-05-05 DIAGNOSIS — Z8673 Personal history of transient ischemic attack (TIA), and cerebral infarction without residual deficits: Secondary | ICD-10-CM | POA: Diagnosis not present

## 2023-05-05 DIAGNOSIS — M7989 Other specified soft tissue disorders: Secondary | ICD-10-CM | POA: Diagnosis not present

## 2023-05-05 DIAGNOSIS — I10 Essential (primary) hypertension: Secondary | ICD-10-CM | POA: Diagnosis not present

## 2023-05-05 DIAGNOSIS — N179 Acute kidney failure, unspecified: Secondary | ICD-10-CM | POA: Diagnosis not present

## 2023-05-05 DIAGNOSIS — Z8616 Personal history of COVID-19: Secondary | ICD-10-CM | POA: Diagnosis not present

## 2023-05-05 DIAGNOSIS — N184 Chronic kidney disease, stage 4 (severe): Secondary | ICD-10-CM | POA: Diagnosis not present

## 2023-05-05 DIAGNOSIS — D631 Anemia in chronic kidney disease: Secondary | ICD-10-CM | POA: Diagnosis not present

## 2023-05-05 DIAGNOSIS — M7122 Synovial cyst of popliteal space [Baker], left knee: Secondary | ICD-10-CM | POA: Insufficient documentation

## 2023-05-05 DIAGNOSIS — Z8249 Family history of ischemic heart disease and other diseases of the circulatory system: Secondary | ICD-10-CM | POA: Diagnosis not present

## 2023-05-05 DIAGNOSIS — M1712 Unilateral primary osteoarthritis, left knee: Secondary | ICD-10-CM | POA: Diagnosis not present

## 2023-05-05 DIAGNOSIS — I5032 Chronic diastolic (congestive) heart failure: Secondary | ICD-10-CM | POA: Diagnosis not present

## 2023-05-05 DIAGNOSIS — I130 Hypertensive heart and chronic kidney disease with heart failure and stage 1 through stage 4 chronic kidney disease, or unspecified chronic kidney disease: Secondary | ICD-10-CM | POA: Diagnosis not present

## 2023-05-05 DIAGNOSIS — Z7901 Long term (current) use of anticoagulants: Secondary | ICD-10-CM | POA: Insufficient documentation

## 2023-05-05 DIAGNOSIS — Z794 Long term (current) use of insulin: Secondary | ICD-10-CM | POA: Diagnosis not present

## 2023-05-05 DIAGNOSIS — E1122 Type 2 diabetes mellitus with diabetic chronic kidney disease: Secondary | ICD-10-CM | POA: Diagnosis not present

## 2023-05-05 DIAGNOSIS — E874 Mixed disorder of acid-base balance: Secondary | ICD-10-CM | POA: Diagnosis not present

## 2023-05-05 DIAGNOSIS — Z833 Family history of diabetes mellitus: Secondary | ICD-10-CM | POA: Diagnosis not present

## 2023-05-05 DIAGNOSIS — R079 Chest pain, unspecified: Secondary | ICD-10-CM | POA: Diagnosis not present

## 2023-05-05 DIAGNOSIS — I252 Old myocardial infarction: Secondary | ICD-10-CM | POA: Diagnosis not present

## 2023-05-05 DIAGNOSIS — N2581 Secondary hyperparathyroidism of renal origin: Secondary | ICD-10-CM | POA: Diagnosis present

## 2023-05-05 DIAGNOSIS — E785 Hyperlipidemia, unspecified: Secondary | ICD-10-CM | POA: Diagnosis present

## 2023-05-05 DIAGNOSIS — I2510 Atherosclerotic heart disease of native coronary artery without angina pectoris: Secondary | ICD-10-CM | POA: Diagnosis present

## 2023-05-05 DIAGNOSIS — Z853 Personal history of malignant neoplasm of breast: Secondary | ICD-10-CM | POA: Diagnosis not present

## 2023-05-05 DIAGNOSIS — R0602 Shortness of breath: Secondary | ICD-10-CM | POA: Diagnosis not present

## 2023-05-05 DIAGNOSIS — M25562 Pain in left knee: Secondary | ICD-10-CM | POA: Diagnosis not present

## 2023-05-05 NOTE — Discharge Instructions (Addendum)
Please read and follow all provided instructions.  Your diagnoses today include:  1. Left leg pain   2. Synovial cyst of left popliteal space    Tests performed today include: X-ray of the chest: No signs of lung problem X-ray of the knee: Shows arthritis, no fractures Ultrasound of the lower extremity: Shows a Baker's cyst, no blood clots Vital signs. See below for your results today.   Medications prescribed:  None  Take any prescribed medications only as directed.  Home care instructions:  Follow any educational materials contained in this packet.  BE VERY CAREFUL not to take multiple medicines containing Tylenol (also called acetaminophen). Doing so can lead to an overdose which can damage your liver and cause liver failure and possibly death.   Follow-up instructions: Please follow-up with your primary care provider in the next 7 days for further evaluation of your symptoms.   Return instructions:  Please return to the Emergency Department if you experience worsening symptoms.  Return with worsening chest pain, shortness of breath, fever, worsening leg swelling Please return if you have any other emergent concerns.  Additional Information:  Your vital signs today were: BP (!) 162/67   Pulse 85   Temp 98 F (36.7 C) (Oral)   Resp 12   Ht 5\' 4"  (1.626 m)   Wt 68 kg   SpO2 99%   BMI 25.75 kg/m  If your blood pressure (BP) was elevated above 135/85 this visit, please have this repeated by your doctor within one month. --------------

## 2023-05-05 NOTE — ED Provider Notes (Signed)
[place] EMERGENCY DEPARTMENT AT [facility] Provider Note   CSN: [id number] Arrival date & time: 05/05/23  1148     History  Chief Complaint  Patient presents with   Shortness of Breath   Leg Pain    [name] is a 78 y.o. female.  Patient with history of DVT on chronic anticoagulation presents with family member today for evaluation of left lower extremity pain.  Patient was seen at [facility] walk-in clinic earlier today.  She was referred to the emergency department for DVT study.  Patient reportedly had lower extremity pain yesterday, worse with bearing weight.  No falls or injuries reported.  Today was she was able to ambulate with a walker but continued to have some pain in the calf and knee area.  This prompted evaluation.  Patient has had some shortness of breath but no chest pain.  No cough, fever, chills.       Home Medications Prior to Admission medications   Medication Sig Start Date End Date Taking? Authorizing Provider  acetaminophen (TYLENOL) 500 MG tablet Take 1 tablet (500 mg total) by mouth every 6 (six) hours as needed. Patient taking differently: Take 500 mg by mouth every 6 (six) hours as needed for mild pain. 05/11/22   Derwood Kaplan, MD  amLODipine (NORVASC) 10 MG tablet Take 1 tablet (10 mg total) by mouth daily. 10/26/22     apixaban (ELIQUIS) 2.5 MG TABS tablet Take 1 tablet (2.5 mg total) by mouth 2 (two) times daily. 10/30/21   Arrien, York Ram, MD  colestipol (COLESTID) 1 g tablet Take 2 g by mouth at bedtime. 07/06/22   [provider]  CREON 36000-114000 units CPEP capsule Take 36,000 Units by mouth 3 (three) times daily with meals. 06/05/22   [provider]  docusate sodium (COLACE) 100 MG capsule Take 1 capsule (100 mg total) by mouth 2 (two) times daily. 09/25/22   Barnetta Chapel, MD  famotidine (PEPCID) 20 MG tablet Take 20 mg by mouth daily as needed for heartburn or indigestion. 10/14/21   [provider]  furosemide (LASIX) 40 MG tablet Take 1 tablet (40 mg total) by mouth daily. 04/09/23     hydrALAZINE (APRESOLINE) 50 MG tablet Take 1 tablet (50 mg total) by mouth in the morning and at bedtime. 12/15/22 03/30/23  Horton, Clabe Seal, DO  methocarbamol (ROBAXIN) 500 MG tablet Take 1 tablet (500 mg total) by mouth 2 (two) times daily as needed for muscle spasms. 11/26/22   Schutt, Edsel Petrin, PA-C  mirtazapine (REMERON) 15 MG tablet Take 1 tablet (15 mg total) by mouth at bedtime. 12/27/22 12/22/23  Windell Norfolk, MD  nitroGLYCERIN (NITROSTAT) 0.4 MG SL tablet DISSOLVE 1 TABLET UNDER THE TONGUE EVERY 5 MINUTES AS  NEEDED FOR CHEST PAIN. MAX  OF 3 TABLETS IN 15 MINUTES. CALL 911 IF PAIN PERSISTS. Patient taking differently: Place 0.4 mg under the tongue every 5 (five) minutes as needed for chest pain. 10/04/20   Jake Bathe, MD  pantoprazole (PROTONIX) 40 MG tablet Take 1 tablet (40 mg total) by mouth 2 (two) times daily. 12/23/21   Jacalyn Lefevre, MD  rivastigmine (EXELON) 1.5 MG capsule Take 1 capsule (1.5 mg total) by mouth 2 (two) times daily. 12/27/22 07/25/23  Windell Norfolk, MD  tiZANidine (ZANAFLEX) 4 MG capsule Take 1 capsule (4 mg total) by mouth 3 (three) times daily. 07/29/22   Hughie Closs, MD      Allergies    Anesthetics,  halogenated; Etomidate; Fentanyl; Percocet [oxycodone-acetaminophen]; Diazepam; Lorazepam; Tramadol hcl; Haloperidol; Morphine sulfate; Propoxyphene; and Versed [midazolam]    Review of Systems   Review of Systems  Physical Exam Updated Vital Signs BP 135/75 (BP Location: Left Arm)   Pulse 84   Temp (!) 97.5 F (36.4 C)   Resp 18   Ht 5\' 4"  (1.626 m)   Wt 68 kg   SpO2 100%   BMI 25.75 kg/m   Physical Exam Vitals and nursing note reviewed.  Constitutional:      Appearance: She is well-developed.  HENT:     Head: Normocephalic and atraumatic.  Eyes:     Conjunctiva/sclera: Conjunctivae normal.  Cardiovascular:     Comments: 2+ DP pulses  bilaterally Pulmonary:     Effort: No respiratory distress.  Musculoskeletal:     Cervical back: Normal range of motion and neck supple.     Right lower leg: No tenderness. Edema present.     Left lower leg: Tenderness (Proximal calf) present. Edema present.     Comments: 1+ pitting edema bilaterally.  Patient does have some tenderness in the left knee with flexion.  Skin:    General: Skin is warm and dry.  Neurological:     Mental Status: She is alert.     ED Results / Procedures / Treatments   Labs (all labs ordered are listed, but only abnormal results are displayed) Labs Reviewed - No data to display  EKG None  Radiology DG Chest 2 View Result Date: 05/05/2023 CLINICAL DATA:  Shortness of breath.  Chest pain for 2 weeks. EXAM: CHEST - 2 VIEW COMPARISON:  01/02/2023. FINDINGS: Bilateral lung fields are clear. Bilateral costophrenic angles are clear. Normal cardio-mediastinal silhouette. No acute osseous abnormalities. The soft tissues are within normal limits. Redemonstration of multiple surgical staples overlying the right axillary region and right breast region. IMPRESSION: No active cardiopulmonary disease. Electronically Signed   By: Jules Schick M.D.   On: 05/05/2023 13:04    Procedures Procedures    Medications Ordered in ED Medications - No data to display  ED Course/ Medical Decision Making/ A&P    Patient seen and examined. History obtained directly from patient and family member at bedside.  Labs/EKG: None ordered  Imaging: Ordered chest x-ray, left knee x-ray, lower extremity DVT study.  Medications/Fluids: None ordered  Most recent vital signs reviewed and are as follows: BP 135/75 (BP Location: Left Arm)   Pulse 84   Temp (!) 97.5 F (36.4 C)   Resp 18   Ht 5\' 4"  (1.626 m)   Wt 68 kg   SpO2 100%   BMI 25.75 kg/m   Initial impression: Left knee and lower extremity pain, here to be evaluated for DVT.  5:57 PM Reassessment performed. Patient  appears stable, comfortable.  She is excited to go home.  Imaging personally visualized and interpreted including: Chest x-ray agree negative, x-ray of the knee, agree no fracture or dislocation.  Reviewed results of DVT study, Baker's cyst noted.  No DVT.  Reviewed pertinent lab work and imaging with patient and family at bedside. Questions answered.   Most current vital signs reviewed and are as follows: BP (!) 162/67   Pulse 85   Temp 98 F (36.7 C) (Oral)   Resp 12   Ht 5\' 4"  (1.626 m)   Wt 68 kg   SpO2 99%   BMI 25.75 kg/m   Plan: Discharge to home.   Prescriptions written for: None  Other home  care instructions discussed: Continue OTC meds, ice heat, rest, compression  ED return instructions discussed: New or worsening symptoms  Follow-up instructions discussed: Patient encouraged to follow-up with their PCP in 5-7 days for recheck if not improved.                                 Medical Decision Making Amount and/or Complexity of Data Reviewed Radiology: ordered.   Patient with left leg pain and knee pain starting yesterday.  She has a history of DVT, anticoagulated and compliant.  Sent in to evaluate for DVT today.  X-ray of the knee was negative.  Baker's cyst noted on DVT study.  This is likely etiology of her symptoms.  Patient has been able to bear weight today and was feeling better than yesterday.  Will continue routine care at home.        Final Clinical Impression(s) / ED Diagnoses Final diagnoses:  Left leg pain  Synovial cyst of left popliteal space    Rx / DC Orders ED Discharge Orders     None         Renne Crigler, PA-C 05/05/23 1800    Derwood Kaplan, MD 05/06/23 1145

## 2023-05-05 NOTE — ED Triage Notes (Signed)
Patient was recently evaluated at [facility] for intermittent shortness of breath and L leg pain that started two days ago. Pulses and sensation present in L lower leg. Denies injury or trauma to leg. Denies chest pain, fever, chills.

## 2023-05-08 ENCOUNTER — Other Ambulatory Visit: Payer: Self-pay

## 2023-05-08 ENCOUNTER — Inpatient Hospital Stay (HOSPITAL_COMMUNITY)
Admission: EM | Admit: 2023-05-08 | Discharge: 2023-05-14 | DRG: 683 | Disposition: A | Discharge status: Home Health | Payer: Medicare HMO | Attending: Internal Medicine | Admitting: Internal Medicine

## 2023-05-08 ENCOUNTER — Encounter (HOSPITAL_COMMUNITY): Payer: Self-pay | Admitting: *Deleted

## 2023-05-08 DIAGNOSIS — E1122 Type 2 diabetes mellitus with diabetic chronic kidney disease: Secondary | ICD-10-CM | POA: Diagnosis present

## 2023-05-08 DIAGNOSIS — F32A Depression, unspecified: Secondary | ICD-10-CM | POA: Diagnosis present

## 2023-05-08 DIAGNOSIS — I252 Old myocardial infarction: Secondary | ICD-10-CM | POA: Diagnosis not present

## 2023-05-08 DIAGNOSIS — Z923 Personal history of irradiation: Secondary | ICD-10-CM | POA: Diagnosis not present

## 2023-05-08 DIAGNOSIS — Z885 Allergy status to narcotic agent status: Secondary | ICD-10-CM

## 2023-05-08 DIAGNOSIS — Z833 Family history of diabetes mellitus: Secondary | ICD-10-CM

## 2023-05-08 DIAGNOSIS — Z853 Personal history of malignant neoplasm of breast: Secondary | ICD-10-CM | POA: Diagnosis not present

## 2023-05-08 DIAGNOSIS — D631 Anemia in chronic kidney disease: Secondary | ICD-10-CM | POA: Diagnosis present

## 2023-05-08 DIAGNOSIS — E874 Mixed disorder of acid-base balance: Secondary | ICD-10-CM | POA: Diagnosis present

## 2023-05-08 DIAGNOSIS — I2510 Atherosclerotic heart disease of native coronary artery without angina pectoris: Secondary | ICD-10-CM | POA: Diagnosis present

## 2023-05-08 DIAGNOSIS — M109 Gout, unspecified: Secondary | ICD-10-CM | POA: Diagnosis present

## 2023-05-08 DIAGNOSIS — Z8673 Personal history of transient ischemic attack (TIA), and cerebral infarction without residual deficits: Secondary | ICD-10-CM | POA: Diagnosis not present

## 2023-05-08 DIAGNOSIS — K449 Diaphragmatic hernia without obstruction or gangrene: Secondary | ICD-10-CM | POA: Diagnosis present

## 2023-05-08 DIAGNOSIS — N184 Chronic kidney disease, stage 4 (severe): Secondary | ICD-10-CM | POA: Diagnosis present

## 2023-05-08 DIAGNOSIS — Z8616 Personal history of COVID-19: Secondary | ICD-10-CM

## 2023-05-08 DIAGNOSIS — E876 Hypokalemia: Secondary | ICD-10-CM | POA: Diagnosis present

## 2023-05-08 DIAGNOSIS — I5032 Chronic diastolic (congestive) heart failure: Secondary | ICD-10-CM | POA: Diagnosis present

## 2023-05-08 DIAGNOSIS — R5381 Other malaise: Secondary | ICD-10-CM | POA: Diagnosis present

## 2023-05-08 DIAGNOSIS — Z803 Family history of malignant neoplasm of breast: Secondary | ICD-10-CM

## 2023-05-08 DIAGNOSIS — N2581 Secondary hyperparathyroidism of renal origin: Secondary | ICD-10-CM | POA: Diagnosis present

## 2023-05-08 DIAGNOSIS — I10 Essential (primary) hypertension: Secondary | ICD-10-CM | POA: Diagnosis present

## 2023-05-08 DIAGNOSIS — Z800 Family history of malignant neoplasm of digestive organs: Secondary | ICD-10-CM

## 2023-05-08 DIAGNOSIS — Z888 Allergy status to other drugs, medicaments and biological substances status: Secondary | ICD-10-CM

## 2023-05-08 DIAGNOSIS — E785 Hyperlipidemia, unspecified: Secondary | ICD-10-CM | POA: Diagnosis present

## 2023-05-08 DIAGNOSIS — Z794 Long term (current) use of insulin: Secondary | ICD-10-CM

## 2023-05-08 DIAGNOSIS — R1310 Dysphagia, unspecified: Secondary | ICD-10-CM | POA: Diagnosis present

## 2023-05-08 DIAGNOSIS — Z86718 Personal history of other venous thrombosis and embolism: Secondary | ICD-10-CM

## 2023-05-08 DIAGNOSIS — N179 Acute kidney failure, unspecified: Principal | ICD-10-CM | POA: Diagnosis present

## 2023-05-08 DIAGNOSIS — Z79899 Other long term (current) drug therapy: Secondary | ICD-10-CM

## 2023-05-08 DIAGNOSIS — Z801 Family history of malignant neoplasm of trachea, bronchus and lung: Secondary | ICD-10-CM

## 2023-05-08 DIAGNOSIS — Z8249 Family history of ischemic heart disease and other diseases of the circulatory system: Secondary | ICD-10-CM

## 2023-05-08 DIAGNOSIS — I130 Hypertensive heart and chronic kidney disease with heart failure and stage 1 through stage 4 chronic kidney disease, or unspecified chronic kidney disease: Secondary | ICD-10-CM | POA: Diagnosis present

## 2023-05-08 DIAGNOSIS — Z832 Family history of diseases of the blood and blood-forming organs and certain disorders involving the immune mechanism: Secondary | ICD-10-CM

## 2023-05-08 DIAGNOSIS — E860 Dehydration: Secondary | ICD-10-CM | POA: Diagnosis present

## 2023-05-08 DIAGNOSIS — Z604 Social exclusion and rejection: Secondary | ICD-10-CM | POA: Diagnosis present

## 2023-05-08 DIAGNOSIS — E119 Type 2 diabetes mellitus without complications: Secondary | ICD-10-CM

## 2023-05-08 DIAGNOSIS — K219 Gastro-esophageal reflux disease without esophagitis: Secondary | ICD-10-CM | POA: Diagnosis present

## 2023-05-08 DIAGNOSIS — Z7901 Long term (current) use of anticoagulants: Secondary | ICD-10-CM | POA: Diagnosis not present

## 2023-05-08 DIAGNOSIS — M7122 Synovial cyst of popliteal space [Baker], left knee: Secondary | ICD-10-CM | POA: Diagnosis present

## 2023-05-08 DIAGNOSIS — R413 Other amnesia: Secondary | ICD-10-CM | POA: Diagnosis present

## 2023-05-08 LAB — CBC
HCT: 34 % — ABNORMAL LOW (ref 36.0–46.0)
Hemoglobin: 10.5 g/dL — ABNORMAL LOW (ref 12.0–15.0)
MCH: 26.6 pg (ref 26.0–34.0)
MCHC: 30.9 g/dL (ref 30.0–36.0)
MCV: 86.3 fL (ref 80.0–100.0)
Platelets: 465 10*3/uL — ABNORMAL HIGH (ref 150–400)
RBC: 3.94 MIL/uL (ref 3.87–5.11)
RDW: 19.1 % — ABNORMAL HIGH (ref 11.5–15.5)
WBC: 16.8 10*3/uL — ABNORMAL HIGH (ref 4.0–10.5)
nRBC: 0.1 % (ref 0.0–0.2)

## 2023-05-08 LAB — COMPREHENSIVE METABOLIC PANEL
ALT: 13 U/L (ref 0–44)
AST: 13 U/L — ABNORMAL LOW (ref 15–41)
Albumin: 2.9 g/dL — ABNORMAL LOW (ref 3.5–5.0)
Alkaline Phosphatase: 60 U/L (ref 38–126)
Anion gap: 21 — ABNORMAL HIGH (ref 5–15)
BUN: 119 mg/dL — ABNORMAL HIGH (ref 8–23)
CO2: 12 mmol/L — ABNORMAL LOW (ref 22–32)
Calcium: 6.9 mg/dL — ABNORMAL LOW (ref 8.9–10.3)
Chloride: 100 mmol/L (ref 98–111)
Creatinine, Ser: 5.98 mg/dL — ABNORMAL HIGH (ref 0.44–1.00)
GFR, Estimated: 7 mL/min — ABNORMAL LOW (ref >60)
Glucose, Bld: 93 mg/dL (ref 70–99)
Potassium: 3 mmol/L — ABNORMAL LOW (ref 3.5–5.1)
Sodium: 133 mmol/L — ABNORMAL LOW (ref 135–145)
Total Bilirubin: 0.5 mg/dL (ref 0.0–1.2)
Total Protein: 7.2 g/dL (ref 6.5–8.1)

## 2023-05-08 LAB — SAMPLE TO BLOOD BANK

## 2023-05-08 LAB — LIPASE, BLOOD: Lipase: 132 U/L — ABNORMAL HIGH (ref 11–51)

## 2023-05-08 MED ORDER — MIRTAZAPINE 15 MG PO TABS
15.0000 mg | ORAL_TABLET | Freq: Every day | ORAL | Status: DC
  Administered 2023-05-08 – 2023-05-13 (×6): 15 mg via ORAL
  Filled 2023-05-08 (×6): qty 1

## 2023-05-08 MED ORDER — LACTATED RINGERS IV BOLUS
1500.0000 mL | Freq: Once | INTRAVENOUS | Status: AC
  Administered 2023-05-08: 1500 mL via INTRAVENOUS

## 2023-05-08 MED ORDER — ACETAMINOPHEN 325 MG PO TABS
650.0000 mg | ORAL_TABLET | Freq: Four times a day (QID) | ORAL | Status: DC | PRN
  Administered 2023-05-12: 650 mg via ORAL
  Filled 2023-05-08: qty 2

## 2023-05-08 MED ORDER — INSULIN ASPART 100 UNIT/ML IJ SOLN
0.0000 [IU] | Freq: Three times a day (TID) | INTRAMUSCULAR | Status: DC
  Administered 2023-05-10 – 2023-05-12 (×4): 1 [IU] via SUBCUTANEOUS
  Administered 2023-05-13: 2 [IU] via SUBCUTANEOUS
  Administered 2023-05-14: 1 [IU] via SUBCUTANEOUS

## 2023-05-08 MED ORDER — APIXABAN 2.5 MG PO TABS
2.5000 mg | ORAL_TABLET | Freq: Two times a day (BID) | ORAL | Status: DC
  Administered 2023-05-08 – 2023-05-14 (×12): 2.5 mg via ORAL
  Filled 2023-05-08 (×13): qty 1

## 2023-05-08 MED ORDER — POTASSIUM CHLORIDE 10 MEQ/100ML IV SOLN
10.0000 meq | Freq: Once | INTRAVENOUS | Status: AC
  Administered 2023-05-08: 10 meq via INTRAVENOUS
  Filled 2023-05-08: qty 100

## 2023-05-08 MED ORDER — PANTOPRAZOLE SODIUM 40 MG PO TBEC
40.0000 mg | DELAYED_RELEASE_TABLET | Freq: Two times a day (BID) | ORAL | Status: DC
  Administered 2023-05-08 – 2023-05-14 (×12): 40 mg via ORAL
  Filled 2023-05-08 (×13): qty 1

## 2023-05-08 MED ORDER — AMLODIPINE BESYLATE 10 MG PO TABS
10.0000 mg | ORAL_TABLET | Freq: Every day | ORAL | Status: DC
  Administered 2023-05-09 – 2023-05-14 (×6): 10 mg via ORAL
  Filled 2023-05-08: qty 2
  Filled 2023-05-08 (×6): qty 1

## 2023-05-08 MED ORDER — STERILE WATER FOR INJECTION IV SOLN
INTRAVENOUS | Status: AC
  Filled 2023-05-08: qty 150
  Filled 2023-05-08: qty 1000

## 2023-05-08 MED ORDER — ACETAMINOPHEN 650 MG RE SUPP: 650.0000 mg | Freq: Four times a day (QID) | RECTAL | Status: DC | PRN

## 2023-05-08 NOTE — ED Notes (Signed)
Brief changed. New linens provided. [place] footwear and hospital gown provided. Pt comforting measures implemented. VS updated.

## 2023-05-08 NOTE — ED Provider Notes (Signed)
[place] EMERGENCY DEPARTMENT AT [facility] Provider Note   CSN: [id number] Arrival date & time: 05/08/23  1756     History Chief Complaint  Patient presents with   GI Bleeding    HPI Sekai [place] Goodwine is a 78 y.o. female presenting for ongoing diarrhea, fatigue and altered mental status. Notably this is a repeat visit for me.  I evaluated Ms. Starks approximately 3 weeks ago at the [facility] emergency room. At that time I saw her for similar symptoms, she was sent in by her general surgeon for diarrheal episodes recommended a oral contrasted CT scan.  It was performed with no focal pathology.  However her labs at that time did demonstrate an AKI.  As per my documentation, we had discussed admitting her due to the degree of complexity of her situation.  However by the end of the evening, patient was tolerating p.o. intake, daughter was at bedside and an appointment had already been scheduled for the next morning with their PCP so they elected to follow-up the AKI/dehydration with a primary care provider.  I expected lab work improvement since patient had been aggressively rehydrated and had had gross improvement of her mental status while in the emergency room.  Unfortunately I cannot see the results of the follow-up visit with the PCP. However on visit today, daughter is endorsing progressive diarrheal symptoms, worsening at the time.  Last month she had been unable to produce a sample while she was in the emergency room and has not had a sample collected since discharge from the emergency room.  Patient's recorded medical, surgical, social, medication list and allergies were reviewed in the Snapshot window as part of the initial history.   Review of Systems   Review of Systems  Constitutional:  Negative for chills and fever.  HENT:  Negative for ear pain and sore throat.   Eyes:  Negative for pain and visual disturbance.  Respiratory:  Negative for cough and shortness of  breath.   Cardiovascular:  Negative for chest pain and palpitations.  Gastrointestinal:  Positive for blood in stool, diarrhea and nausea. Negative for abdominal pain and vomiting.  Genitourinary:  Negative for dysuria and hematuria.  Musculoskeletal:  Negative for arthralgias and back pain.  Skin:  Negative for color change and rash.  Neurological:  Negative for seizures and syncope.  All other systems reviewed and are negative.   Physical Exam Updated Vital Signs BP 113/66 (BP Location: Right Arm)   Pulse 88   Temp 97.7 F (36.5 C) (Oral)   Resp 15   Ht 5\' 4"  (1.626 m)   Wt 68 kg   SpO2 100%   BMI 25.73 kg/m  Physical Exam Vitals and nursing note reviewed.  Constitutional:      General: She is not in acute distress.    Appearance: She is well-developed.  HENT:     Head: Normocephalic and atraumatic.  Eyes:     Conjunctiva/sclera: Conjunctivae normal.  Cardiovascular:     Rate and Rhythm: Normal rate and regular rhythm.     Heart sounds: No murmur heard. Pulmonary:     Effort: Pulmonary effort is normal. No respiratory distress.     Breath sounds: Normal breath sounds.  Abdominal:     General: There is no distension.     Palpations: Abdomen is soft.     Tenderness: There is no abdominal tenderness. There is no right CVA tenderness or left CVA tenderness.  Musculoskeletal:  General: No swelling or tenderness. Normal range of motion.     Cervical back: Neck supple.  Skin:    General: Skin is warm and dry.  Neurological:     General: No focal deficit present.     Mental Status: She is alert and oriented to person, place, and time. Mental status is at baseline.     Cranial Nerves: No cranial nerve deficit.      ED Course/ Medical Decision Making/ A&P    Procedures Procedures   Medications Ordered in ED Medications  potassium chloride 10 mEq in 100 mL IVPB (10 mEq Intravenous New Bag/Given 05/08/23 2133)  lactated ringers bolus 1,500 mL (1,500 mLs  Intravenous New Bag/Given 05/08/23 2133)    Medical Decision Making:   This is a 78 year old female presenting with a myriad of complaints. Her history of present illness and physical exam findings raise concern for progressive diarrheal illness. Last month, CT scan was without focal pathology and she was unable to produce a diarrhea sample for [place] to send off for infectious disease testing.  However given a full month of ongoing symptoms, she appears to have had clinical worsening. I am uncertain and unable to see records for what happened at her follow up appointment that had been discussed at time of disposition at the last visit. Family states that she did indeed follow up at that time and were referred to nephrology. I have ordered a C. difficile and GI path panel but patient is again unable to provide it at this moment. Regardless, her blood work was performed, I fear an ongoing prerenal pathology due to the substantial nature of her rectal output. This was confirmed on her blood work. Discussed with family at bedside.  Patient would be best managed in the inpatient setting, aggressive hydration and monitoring of her renal response to IV hydration.  30 cc/kg ordered in the emergency room, stool samples ordered and patient arranged for admission to medicine for further care management and workup of her AKI.  Disposition:   Based on the above findings, I believe this patient is stable for admission.    Patient/family educated about specific findings on our evaluation and explained exact reasons for admission.  Patient/family educated about clinical situation and time was allowed to answer questions.   Admission team communicated with and agreed with need for admission. Patient admitted. Patient ready to move at this time.     Emergency Department Medication Summary:   Medications  potassium chloride 10 mEq in 100 mL IVPB (10 mEq Intravenous New Bag/Given 05/08/23 2133)  lactated ringers bolus  1,500 mL (1,500 mLs Intravenous New Bag/Given 05/08/23 2133)         Clinical Impression:  1. Dehydration   2. AKI (acute kidney injury) (HCC)      Admit   Final Clinical Impression(s) / ED Diagnoses Final diagnoses:  Dehydration  AKI (acute kidney injury) [facility])    Rx / DC Orders ED Discharge Orders     None         Glyn Ade, MD 05/08/23 2145

## 2023-05-08 NOTE — H&P (Signed)
History and Physical    [name] RUE:[id number] DOB: 01/10/1946 DOA: 05/08/2023  PCP: Renford Dills, MD   Patient coming from: Home   Chief Complaint: Diarrhea, fatigue   HPI: [name] is a 78 y.o. 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  Patient developed diarrhea approximately 1 month ago.  She reports too numerous to count episodes every day.  There has not been any abdominal pain per se associated with this but she has generalized cramping discomfort at times.  There has not been any nausea, vomiting, fever, or chills.  There was no travel or sick contacts that she recalls prior to onset.  She had been taking Exelon for roughly 2 months prior to symptom onset.  ED Course: Upon arrival to the ED, patient is found to be afebrile and saturating well on room air with normal heart rate and stable blood pressure.  Labs are most notable for potassium 3.0, serum bicarbonate 12, BUN 119, creatinine 5.98, anion gap 21, albumin 2.9, WBC 16,800, and hemoglobin 10.5.  C. difficile testing and GI pathogen panels were ordered from the ED and the patient was treated with 1.5 L of LR and 10 mEq IV potassium.  Review of Systems:  All other systems reviewed and apart from HPI, are negative.  Past Medical History:  Diagnosis Date   Abdominal discomfort    Chronic N/V/D. Presumptive dx Crohn's dx per elevated p ANCA. Failed Entocort and Pentasa. Sep 2003 - ileocolectomy c anastomosis per Dr Orson Slick 2/2 adhesions - path was hegative for Crohns. EGD, Sm bowel follow through (11/03), and an eteroclysis (10/03) were unrevealing. Cuases hypomag and hypocalcemia.   Adnexal mass 8/03   s/p lap BSO (R ovarian fibroma) & lysis of adhesions   Allergy    Seasonal   Anemia    Multifactorial.  Baseline HgB 10-11 ish. B12 def - 150 in 3/10. Fe Def - ferritin 35 3/10. Both are being repleted.   Breast cancer (HCC) 03/16/13   right, 5 o'clock   CAD (coronary artery disease) 1996   1996 - PTCA and angioplasty diagonal branch. 2000 - Rotoblator & angiopllasty of diagonal. 2006 - subendocardial AMI, DES to proximal LAD.Marland Kitchen Also had 90% stenosis in distal apical LAD. EF 55 with apical hypokinesis. Indefinite ASA and Plavix.   CHF (congestive heart failure) (HCC)    Chronic kidney disease    Chronic renal insuff baseline Cr 1.2 - 1.4 ish.   Chronic kidney disease, stage III (moderate) (HCC)    Chronic renal insuff baseline Cr 1.2 - 1.4 ish. New baseline about 2 (2018)   Chronic pain    CT 10/10 = Spinal stenosis L2 - S1.   COVID-19 11/02/2020   Diabetes mellitus    Insulin dependent   Diabetes mellitus type 2 in obese 05/03/2010   Managed on lantus and novolog. Has diabetic nephropathy. Metformin D/C'd 2012 2/2 creatinine.  No diabetic retinopathy per 3/11.    Gout    Hx of radiation therapy 06/02/13- 07/16/13   right rbeast 4500 cGy 25 sessions, right breast boost 1600 cGy in 8 sessions   Hyperlipidemia    Managed with both a statin and Welchol. Welchol stopped 2014 2/2 cost and started on fenofibrate    Hypertension    2006 B renal arteries patent. 2003 MRA - no RAS. 2003 pheo  W/U Dr Caryn Section reportedly negative.   Hypoxia 07/23/2017   Lupus    Lymphedema of breast    Personal history of radiation therapy 2015   Pneumonia of left lung due to infectious organism    RBBB    Renal vein thrombosis (HCC)    SBO (small bowel obstruction) (HCC) 09/17/2017   Secondary hyperparathyroidism (HCC)    Stroke (HCC)    Incidental finding MRI 2002 L lacunar infarct   Vitamin B12 deficiency    Vitamin D deficiency    Wears dentures    top    Past Surgical History:  Procedure Laterality Date   ABDOMINAL HYSTERECTOMY     BILATERAL SALPINGOOPHORECTOMY  8/03   Lap BSO (R ovarian fibroma) and  adhesion lysis   BIOPSY  09/08/2020   Procedure: BIOPSY;  Surgeon: Bernette Redbird, MD;  Location: [facility];  Service: Endoscopy;;   BOWEL RESECTION  2003   ileocolectomy with anastomosis 2/2 adhesions   BREAST BIOPSY Right 2015   BREAST BIOPSY Right 07/2014   BREAST LUMPECTOMY Right 04/22/2013   BREAST LUMPECTOMY WITH NEEDLE LOCALIZATION AND AXILLARY SENTINEL LYMPH NODE BX Right 04/22/2013   Procedure: BREAST LUMPECTOMY WITH NEEDLE LOCALIZATION AND AXILLARY SENTINEL LYMPH NODE BX;  Surgeon: Almond Lint, MD;  Location: [facility];  Service: General;  Laterality: Right;   CARDIAC CATHETERIZATION     2 stents   CHOLECYSTECTOMY     COLONOSCOPY     ESOPHAGOGASTRODUODENOSCOPY N/A 09/08/2020   Procedure: ESOPHAGOGASTRODUODENOSCOPY (EGD);  Surgeon: Bernette Redbird, MD;  Location: [facility];  Service: Endoscopy;  Laterality: N/A;   HEMICOLECTOMY     R sided hemicolectomy   HERNIA REPAIR     Ventral hernia repair   PTCA  4/06    Social History:   reports that she has never smoked. She has never used smokeless tobacco. She reports that she does not drink alcohol and does not use drugs.  Allergies  Allergen Reactions   Anesthetics, Halogenated Other (See Comments)    Per patient's daughter, she can not tolerate anesthetics.    Etomidate Other (See Comments)    Heart stopped during anesthesia   Fentanyl Other (See Comments)    Heart stopped during anesthesia    Percocet [Oxycodone-Acetaminophen] Other (See Comments)    Headaches    Diazepam Other (See Comments)    Agitation   Lorazepam Nausea Only   Tramadol Hcl Nausea And Vomiting   Haloperidol Nausea And Vomiting    **Haldol**   Morphine Sulfate Nausea Only    agitation   Propoxyphene Nausea And Vomiting    **Darvocet** no longer available   Versed [Midazolam] Other (See Comments)    Heart stopped during anesthesia    Family History  Problem Relation Age of Onset   Breast cancer Mother 51   Pancreatic  cancer Brother 38   Lung cancer Maternal Aunt    Breast cancer Maternal Aunt    Kidney failure Maternal Aunt    Prostate cancer Maternal Uncle    Heart attack Maternal Grandmother    Breast cancer Maternal Grandmother    Colon cancer Maternal Grandmother    Clotting disorder Maternal Grandmother    Diabetes Maternal Grandmother    Hypertension Daughter    Pancreatic cancer Other      Prior to Admission medications   Medication Sig Start Date End Date Taking? Authorizing Provider  acetaminophen (TYLENOL) 500 MG tablet Take 1 tablet (500 mg total) by mouth every 6 (six) hours as needed. Patient  taking differently: Take 500 mg by mouth every 6 (six) hours as needed for mild pain. 05/11/22   Derwood Kaplan, MD  amLODipine (NORVASC) 10 MG tablet Take 1 tablet (10 mg total) by mouth daily. 10/26/22     apixaban (ELIQUIS) 2.5 MG TABS tablet Take 1 tablet (2.5 mg total) by mouth 2 (two) times daily. 10/30/21   Arrien, York Ram, MD  colestipol (COLESTID) 1 g tablet Take 2 g by mouth at bedtime. 07/06/22   [provider]  CREON 36000-114000 units CPEP capsule Take 36,000 Units by mouth 3 (three) times daily with meals. 06/05/22   [provider]  docusate sodium (COLACE) 100 MG capsule Take 1 capsule (100 mg total) by mouth 2 (two) times daily. 09/25/22   Barnetta Chapel, MD  famotidine (PEPCID) 20 MG tablet Take 20 mg by mouth daily as needed for heartburn or indigestion. 10/14/21   [provider]  furosemide (LASIX) 40 MG tablet Take 1 tablet (40 mg total) by mouth daily. 04/09/23     hydrALAZINE (APRESOLINE) 50 MG tablet Take 1 tablet (50 mg total) by mouth in the morning and at bedtime. 12/15/22 03/30/23  Horton, Clabe Seal, DO  methocarbamol (ROBAXIN) 500 MG tablet Take 1 tablet (500 mg total) by mouth 2 (two) times daily as needed for muscle spasms. 11/26/22   Schutt, Edsel Petrin, PA-C  mirtazapine (REMERON) 15 MG tablet Take 1 tablet (15 mg total) by mouth at  bedtime. 12/27/22 12/22/23  Windell Norfolk, MD  nitroGLYCERIN (NITROSTAT) 0.4 MG SL tablet DISSOLVE 1 TABLET UNDER THE TONGUE EVERY 5 MINUTES AS  NEEDED FOR CHEST PAIN. MAX  OF 3 TABLETS IN 15 MINUTES. CALL 911 IF PAIN PERSISTS. Patient taking differently: Place 0.4 mg under the tongue every 5 (five) minutes as needed for chest pain. 10/04/20   Jake Bathe, MD  pantoprazole (PROTONIX) 40 MG tablet Take 1 tablet (40 mg total) by mouth 2 (two) times daily. 12/23/21   Jacalyn Lefevre, MD  rivastigmine (EXELON) 1.5 MG capsule Take 1 capsule (1.5 mg total) by mouth 2 (two) times daily. 12/27/22 07/25/23  Windell Norfolk, MD  tiZANidine (ZANAFLEX) 4 MG capsule Take 1 capsule (4 mg total) by mouth 3 (three) times daily. 07/29/22   Hughie Closs, MD    Physical Exam: Vitals:   05/08/23 1759 05/08/23 1827  BP: 113/66   Pulse: 88   Resp: 15   Temp: 97.7 F (36.5 C)   TempSrc: Oral   SpO2: 100%   Weight:  68 kg  Height:  5\' 4"  (1.626 m)    Constitutional: NAD, no pallor or diaphoresis   Eyes: PERTLA, lids and conjunctivae normal ENMT: Mucous membranes are moist. Posterior pharynx clear of any exudate or lesions.   Neck: supple, no masses  Respiratory: no wheezing, no crackles. No accessory muscle use.  Cardiovascular: S1 & S2 heard, regular rate and rhythm. Lower extremity edema.  Abdomen: no tenderness, soft. Bowel sounds active.  Musculoskeletal: no clubbing / cyanosis. No joint deformity upper and lower extremities.   Skin: no significant rashes, lesions, ulcers. Warm, dry, well-perfused. Neurologic: CN 2-12 grossly intact. Moving all extremities. Alert and oriented.  Psychiatric: Calm. Cooperative.    Labs and Imaging on Admission: I have personally reviewed following labs and imaging studies  CBC: Recent Labs  Lab 05/08/23 1833  WBC 16.8*  HGB 10.5*  HCT 34.0*  MCV 86.3  PLT 465*   Basic Metabolic Panel: Recent Labs  Lab 05/08/23 1833  NA  133*  K 3.0*  CL 100  CO2 12*   GLUCOSE 93  BUN 119*  CREATININE 5.98*  CALCIUM 6.9*   GFR: Estimated Creatinine Clearance: 7.7 mL/min (A) (by C-G formula based on SCr of 5.98 mg/dL (H)). Liver Function Tests: Recent Labs  Lab 05/08/23 1833  AST 13*  ALT 13  ALKPHOS 60  BILITOT 0.5  PROT 7.2  ALBUMIN 2.9*   Recent Labs  Lab 05/08/23 1833  LIPASE 132*   No results for input(s): "AMMONIA" in the last 168 hours. Coagulation Profile: No results for input(s): "INR", "PROTIME" in the last 168 hours. Cardiac Enzymes: No results for input(s): "CKTOTAL", "CKMB", "CKMBINDEX", "TROPONINI" in the last 168 hours. BNP (last 3 results) No results for input(s): "PROBNP" in the last 8760 hours. HbA1C: No results for input(s): "HGBA1C" in the last 72 hours. CBG: No results for input(s): "GLUCAP" in the last 168 hours. Lipid Profile: No results for input(s): "CHOL", "HDL", "LDLCALC", "TRIG", "CHOLHDL", "LDLDIRECT" in the last 72 hours. Thyroid Function Tests: No results for input(s): "TSH", "T4TOTAL", "FREET4", "T3FREE", "THYROIDAB" in the last 72 hours. Anemia Panel: No results for input(s): "VITAMINB12", "FOLATE", "FERRITIN", "TIBC", "IRON", "RETICCTPCT" in the last 72 hours. Urine analysis:    Component Value Date/Time   COLORURINE STRAW (A) 12/15/2022 1720   APPEARANCEUR HAZY (A) 12/15/2022 1720   APPEARANCEUR Clear 08/22/2018 1620   LABSPEC 1.010 12/15/2022 1720   LABSPEC 1.020 03/03/2014 1341   PHURINE 5.0 12/15/2022 1720   GLUCOSEU NEGATIVE 12/15/2022 1720   GLUCOSEU Negative 03/03/2014 1341   HGBUR NEGATIVE 12/15/2022 1720   BILIRUBINUR NEGATIVE 12/15/2022 1720   BILIRUBINUR Negative 08/22/2018 1620   BILIRUBINUR Negative 03/03/2014 1341   KETONESUR NEGATIVE 12/15/2022 1720   PROTEINUR 100 (A) 12/15/2022 1720   UROBILINOGEN 0.2 03/03/2014 1341   NITRITE NEGATIVE 12/15/2022 1720   LEUKOCYTESUR NEGATIVE 12/15/2022 1720   LEUKOCYTESUR Negative 03/03/2014 1341   Sepsis  Labs: @LABRCNTIP (procalcitonin:4,lacticidven:4) )No results found for this or any previous visit (from the past 240 hours).   Radiological Exams on Admission: No results found.  EKG: Independently reviewed. Sinus rhythm, RBBB.   Assessment/Plan   1. AKI superimposed on CKD IV; metabolic acidosis  - BUN is 119 and SCr 5.98 with serum bicarbonate 12 and AG 21 in setting of severe diarrhea  - Hold Lasix, hydrate with isotonic bicarbonate infusion, repeat chem panel in am    2. Diarrhea  - Unclear etiology  - Hold Exelon, check C diff and GI pathogen panel, continue IVF hydration, monitor and correct electrolytes   3. Hx of LE DVT and renal artery thrombosis  - Eliquis    4. Hypertension   - Norvasc    6. Chronic HFpEF  - Hold Lasix, monitor weight and I/Os    7. Type 2 DM  - A1c was 5.3% in July 2024  - Check CBGs and use low-intensity SSI for now    8. Depression; memory loss  - Continue Remeron, hold Exelon for now in light of diarrhea or unclear etiology    DVT prophylaxis: Eliquis  Code Status: Full  Level of Care: Level of care: Med-Surg Family Communication: Daughter at bedside   Disposition Plan:  Patient is from: home  Anticipated d/c is to: TBD Anticipated d/c date is: 05/12/23  Patient currently: Pending improved renal function  Consults called: None  Admission status: Inpatient     Briscoe Deutscher, MD [facility]  05/08/2023, 9:50 PM

## 2023-05-08 NOTE — ED Triage Notes (Signed)
The pt is c/o painful bright red bloody stools and pain in her  rt shoulder  all for 24 hours

## 2023-05-09 DIAGNOSIS — E876 Hypokalemia: Secondary | ICD-10-CM | POA: Diagnosis not present

## 2023-05-09 DIAGNOSIS — N184 Chronic kidney disease, stage 4 (severe): Secondary | ICD-10-CM | POA: Diagnosis not present

## 2023-05-09 DIAGNOSIS — N179 Acute kidney failure, unspecified: Secondary | ICD-10-CM | POA: Diagnosis not present

## 2023-05-09 LAB — BASIC METABOLIC PANEL
Anion gap: 17 — ABNORMAL HIGH (ref 5–15)
Anion gap: 18 — ABNORMAL HIGH (ref 5–15)
BUN: 122 mg/dL — ABNORMAL HIGH (ref 8–23)
BUN: 122 mg/dL — ABNORMAL HIGH (ref 8–23)
CO2: 16 mmol/L — ABNORMAL LOW (ref 22–32)
CO2: 16 mmol/L — ABNORMAL LOW (ref 22–32)
Calcium: 6.8 mg/dL — ABNORMAL LOW (ref 8.9–10.3)
Calcium: 7.1 mg/dL — ABNORMAL LOW (ref 8.9–10.3)
Chloride: 102 mmol/L (ref 98–111)
Chloride: 102 mmol/L (ref 98–111)
Creatinine, Ser: 5.21 mg/dL — ABNORMAL HIGH (ref 0.44–1.00)
Creatinine, Ser: 5.1 mg/dL — ABNORMAL HIGH (ref 0.44–1.00)
GFR, Estimated: 8 mL/min — ABNORMAL LOW (ref >60)
GFR, Estimated: 8 mL/min — ABNORMAL LOW (ref >60)
Glucose, Bld: 76 mg/dL (ref 70–99)
Glucose, Bld: 150 mg/dL — ABNORMAL HIGH (ref 70–99)
Potassium: 2.6 mmol/L — CL (ref 3.5–5.1)
Potassium: 3.2 mmol/L — ABNORMAL LOW (ref 3.5–5.1)
Sodium: 135 mmol/L (ref 135–145)
Sodium: 136 mmol/L (ref 135–145)

## 2023-05-09 LAB — CBC
HCT: 30.1 % — ABNORMAL LOW (ref 36.0–46.0)
Hemoglobin: 9.6 g/dL — ABNORMAL LOW (ref 12.0–15.0)
MCH: 27.2 pg (ref 26.0–34.0)
MCHC: 31.9 g/dL (ref 30.0–36.0)
MCV: 85.3 fL (ref 80.0–100.0)
Platelets: 345 10*3/uL (ref 150–400)
RBC: 3.53 MIL/uL — ABNORMAL LOW (ref 3.87–5.11)
RDW: 18.8 % — ABNORMAL HIGH (ref 11.5–15.5)
WBC: 13.8 10*3/uL — ABNORMAL HIGH (ref 4.0–10.5)
nRBC: 0 % (ref 0.0–0.2)

## 2023-05-09 LAB — GLUCOSE, CAPILLARY
Glucose-Capillary: 76 mg/dL (ref 70–99)
Glucose-Capillary: 95 mg/dL (ref 70–99)
Glucose-Capillary: 113 mg/dL — ABNORMAL HIGH (ref 70–99)

## 2023-05-09 LAB — MAGNESIUM
Magnesium: 0.9 mg/dL — CL (ref 1.7–2.4)
Magnesium: 1.6 mg/dL — ABNORMAL LOW (ref 1.7–2.4)
Magnesium: 1.4 mg/dL — ABNORMAL LOW (ref 1.7–2.4)

## 2023-05-09 LAB — C DIFFICILE QUICK SCREEN W PCR REFLEX
C Diff antigen: NEGATIVE
C Diff interpretation: NOT DETECTED
C Diff toxin: NEGATIVE

## 2023-05-09 LAB — HEMOGLOBIN A1C
Hgb A1c MFr Bld: 6.2 % — ABNORMAL HIGH (ref 4.8–5.6)
Mean Plasma Glucose: 131.24 mg/dL

## 2023-05-09 LAB — CBG MONITORING, ED
Glucose-Capillary: 67 mg/dL — ABNORMAL LOW (ref 70–99)
Glucose-Capillary: 69 mg/dL — ABNORMAL LOW (ref 70–99)
Glucose-Capillary: 87 mg/dL (ref 70–99)

## 2023-05-09 LAB — PHOSPHORUS: Phosphorus: 6.8 mg/dL — ABNORMAL HIGH (ref 2.5–4.6)

## 2023-05-09 MED ORDER — MAGNESIUM SULFATE 2 GM/50ML IV SOLN
2.0000 g | Freq: Once | INTRAVENOUS | Status: AC
  Administered 2023-05-09: 2 g via INTRAVENOUS
  Filled 2023-05-09: qty 50

## 2023-05-09 MED ORDER — POTASSIUM CHLORIDE 10 MEQ/100ML IV SOLN
10.0000 meq | INTRAVENOUS | Status: AC
  Filled 2023-05-09: qty 100

## 2023-05-09 MED ORDER — POTASSIUM CHLORIDE 10 MEQ/100ML IV SOLN
10.0000 meq | INTRAVENOUS | Status: AC
Start: 2023-05-09 — End: 2023-05-09
  Administered 2023-05-09 (×2): 10 meq via INTRAVENOUS
  Filled 2023-05-09 (×2): qty 100

## 2023-05-09 MED ORDER — POTASSIUM CHLORIDE CRYS ER 20 MEQ PO TBCR
40.0000 meq | EXTENDED_RELEASE_TABLET | Freq: Once | ORAL | Status: AC
  Administered 2023-05-09: 40 meq via ORAL
  Filled 2023-05-09: qty 2

## 2023-05-09 MED ORDER — MAGNESIUM OXIDE -MG SUPPLEMENT 400 (240 MG) MG PO TABS
800.0000 mg | ORAL_TABLET | Freq: Two times a day (BID) | ORAL | Status: DC
  Administered 2023-05-09 – 2023-05-10 (×3): 800 mg via ORAL
  Filled 2023-05-09 (×4): qty 2

## 2023-05-09 MED ORDER — MAGNESIUM CHLORIDE 64 MG PO TBEC
2.0000 | DELAYED_RELEASE_TABLET | Freq: Two times a day (BID) | ORAL | Status: AC
  Administered 2023-05-09 (×2): 128 mg via ORAL
  Filled 2023-05-09 (×3): qty 2

## 2023-05-09 NOTE — ED Notes (Signed)
[name], daughter called for an update  [phone]

## 2023-05-09 NOTE — ED Notes (Signed)
Assisted to restroom via wheelchair. Encouraged to use towelette prior to providing urine but was unsuccessful at teaching.   Specimen of urine and bowel unable to be collected.

## 2023-05-09 NOTE — ED Notes (Signed)
Pt otf in no new onset distress, meds/fluids sent with pt.

## 2023-05-09 NOTE — Progress Notes (Signed)
  Progress Note   Patient: [name] JXB:[id number] DOB: 02-14-1946 DOA: 05/08/2023     1 DOS: the patient was seen and examined on 05/09/2023   Brief hospital course:  78 y.o. 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   Assessment and Plan:  Acute kidney injury on CKD 4 - Baseline creatinine as low was 2.56 months ago.  Nearly 6 on presentation.  Likely prerenal etiology.  Currently on bicarb drip.  Continue to monitor urine output and recheck panel in AM.  Mixed acid-base disorder - Likely secondary to chronic diarrhea and superimposed metabolic acidosis from CKD.  Currently on bicarb drip, showing improvement.  Hypokalemia with severe hypomagnesemia - Likely secondary to GI loss.  Will begin an aggressive IV magnesium supplementation.  Potassium supplementation on board as well.  Order telemetry until electrolyte abnormalities resolved.  Intractable diarrhea - Patient stated she has had loose stools for upwards of 2 weeks.  Since presentation however states she has not had a bowel movement.  C. difficile and GI pathogen profile pending.  Will hold off on diet diarrhea loss at this time.  History of lower extremity DVT and renal artery thrombosis - Continue Eliquis  Chronic HFpEF - Does not appear to be in exacerbation.  Will monitor closely given IV fluid resuscitation.  Diabetes mellitus - Insulin sliding scale on board.  Depression/memory loss - Remeron on board.  Physical debilitation and muscle weakness - Exacerbated by severe electrolyte depletion.  Will evaluate for PT after electrolytes corrected.     Subjective: Patient laying in bed comfortably this morning.  States she has not had a bowel movement yet since presentation but still feels profoundly weak.  No  other symptoms such as fever, shortness of breath, chest pain, nausea, vomiting, abdominal pain.  She has had this happen before many years ago and it resolved on its own.  Physical Exam: Vitals:   05/09/23 0700 05/09/23 1009 05/09/23 1102 05/09/23 1130  BP: (!) 156/75   138/66  Pulse: 79 74 75 76  Resp: 13 (!) 8 10 11   Temp:      TempSrc:      SpO2: 99% 98% 98% 99%  Weight:      Height:       GENERAL:  Alert, pleasant, no acute distress  HEENT:  EOMI CARDIOVASCULAR:  RRR, no murmurs appreciated RESPIRATORY:  Clear to auscultation, no wheezing, rales, or rhonchi GASTROINTESTINAL:  Soft, nontender, nondistended EXTREMITIES:  No LE edema bilaterally NEURO:  No new focal deficits appreciated SKIN:  No rashes noted PSYCH:  Appropriate mood and affect   Data Reviewed:  There are no new results to review at this time.  Family Communication: None at bedside  Disposition: Status is: Inpatient Remains inpatient appropriate because: Severe hypokalemia/hypomagnesemia and AKI  Planned Discharge Destination:  To be determined by PT for electrolyte replenishment    Time spent: 35 minutes  Author: Deanna Artis, DO 05/09/2023 12:07 PM  For on call review [email or web address].

## 2023-05-09 NOTE — ED Notes (Addendum)
Pt cbg found to be 71 this am 0750 prior to breakfast, 4 oz orange juice given at bed side, repeat cbg at 0821 found to be 89. RN will continue to monitor.

## 2023-05-10 ENCOUNTER — Other Ambulatory Visit: Payer: Self-pay

## 2023-05-10 DIAGNOSIS — N184 Chronic kidney disease, stage 4 (severe): Secondary | ICD-10-CM | POA: Diagnosis not present

## 2023-05-10 DIAGNOSIS — N179 Acute kidney failure, unspecified: Secondary | ICD-10-CM | POA: Diagnosis not present

## 2023-05-10 DIAGNOSIS — D509 Iron deficiency anemia, unspecified: Secondary | ICD-10-CM

## 2023-05-10 LAB — GASTROINTESTINAL PANEL BY PCR, STOOL (REPLACES STOOL CULTURE)

## 2023-05-10 LAB — GLUCOSE, CAPILLARY
Glucose-Capillary: 81 mg/dL (ref 70–99)
Glucose-Capillary: 111 mg/dL — ABNORMAL HIGH (ref 70–99)
Glucose-Capillary: 183 mg/dL — ABNORMAL HIGH (ref 70–99)
Glucose-Capillary: 154 mg/dL — ABNORMAL HIGH (ref 70–99)

## 2023-05-10 LAB — CBC
HCT: 27.5 % — ABNORMAL LOW (ref 36.0–46.0)
Hemoglobin: 9.1 g/dL — ABNORMAL LOW (ref 12.0–15.0)
MCH: 27.3 pg (ref 26.0–34.0)
MCHC: 33.1 g/dL (ref 30.0–36.0)
MCV: 82.6 fL (ref 80.0–100.0)
Platelets: 343 10*3/uL (ref 150–400)
RBC: 3.33 MIL/uL — ABNORMAL LOW (ref 3.87–5.11)
RDW: 19.1 % — ABNORMAL HIGH (ref 11.5–15.5)
WBC: 14 10*3/uL — ABNORMAL HIGH (ref 4.0–10.5)
nRBC: 0.1 % (ref 0.0–0.2)

## 2023-05-10 LAB — RENAL FUNCTION PANEL
Albumin: 2.3 g/dL — ABNORMAL LOW (ref 3.5–5.0)
Anion gap: 18 — ABNORMAL HIGH (ref 5–15)
BUN: 115 mg/dL — ABNORMAL HIGH (ref 8–23)
CO2: 16 mmol/L — ABNORMAL LOW (ref 22–32)
Calcium: 7.4 mg/dL — ABNORMAL LOW (ref 8.9–10.3)
Chloride: 105 mmol/L (ref 98–111)
Creatinine, Ser: 4.53 mg/dL — ABNORMAL HIGH (ref 0.44–1.00)
GFR, Estimated: 10 mL/min — ABNORMAL LOW (ref >60)
Glucose, Bld: 84 mg/dL (ref 70–99)
Phosphorus: 5.3 mg/dL — ABNORMAL HIGH (ref 2.5–4.6)
Potassium: 2.9 mmol/L — ABNORMAL LOW (ref 3.5–5.1)
Sodium: 139 mmol/L (ref 135–145)

## 2023-05-10 LAB — MAGNESIUM: Magnesium: 1.6 mg/dL — ABNORMAL LOW (ref 1.7–2.4)

## 2023-05-10 MED ORDER — POTASSIUM CHLORIDE 20 MEQ PO PACK
40.0000 meq | PACK | Freq: Once | ORAL | Status: AC
  Administered 2023-05-10: 40 meq via ORAL
  Filled 2023-05-10: qty 2

## 2023-05-10 MED ORDER — POTASSIUM CHLORIDE 10 MEQ/100ML IV SOLN
10.0000 meq | INTRAVENOUS | Status: AC
  Administered 2023-05-10 (×3): 10 meq via INTRAVENOUS
  Filled 2023-05-10 (×3): qty 100

## 2023-05-10 MED ORDER — SODIUM BICARBONATE 8.4 % IV SOLN
INTRAVENOUS | Status: DC
  Filled 2023-05-10 (×3): qty 1000

## 2023-05-10 MED ORDER — COLESTIPOL HCL 1 G PO TABS
1.0000 g | ORAL_TABLET | Freq: Three times a day (TID) | ORAL | Status: DC
  Administered 2023-05-10 – 2023-05-14 (×13): 1 g via ORAL
  Filled 2023-05-10 (×16): qty 1

## 2023-05-10 MED ORDER — DIPHENOXYLATE-ATROPINE 2.5-0.025 MG PO TABS
1.0000 | ORAL_TABLET | Freq: Two times a day (BID) | ORAL | Status: DC
  Administered 2023-05-10 – 2023-05-13 (×8): 1 via ORAL
  Filled 2023-05-10 (×8): qty 1

## 2023-05-10 MED ORDER — PANCRELIPASE (LIP-PROT-AMYL) 36000-114000 UNITS PO CPEP
36000.0000 [IU] | ORAL_CAPSULE | Freq: Three times a day (TID) | ORAL | Status: DC
Start: 2023-05-10 — End: 2023-05-14
  Administered 2023-05-10 – 2023-05-14 (×12): 36000 [IU] via ORAL
  Filled 2023-05-10 (×12): qty 1

## 2023-05-10 MED ORDER — MAGNESIUM SULFATE 2 GM/50ML IV SOLN
2.0000 g | Freq: Once | INTRAVENOUS | Status: AC
  Administered 2023-05-10: 2 g via INTRAVENOUS
  Filled 2023-05-10: qty 50

## 2023-05-10 MED ORDER — LOPERAMIDE HCL 2 MG PO CAPS
2.0000 mg | ORAL_CAPSULE | ORAL | Status: DC | PRN
Start: 2023-05-10 — End: 2023-05-10

## 2023-05-10 NOTE — Consult Note (Signed)
[facility] Consult  Referring Provider: Dr.Derek Sharlene Dory Primary Care Physician:  Renford Dills, MD Primary Gastroenterologist: Dr.Brahmbhatt  Reason for Consultation: Intractable diarrhea  HPI: [name] is a 78 y.o. female was admitted on 05/08/2023 with progressive fatigue and persistent diarrhea.  Her daughter who is a Publishing rights manager is present at bedside who provided most of the history as per documentation patient has history of memory loss.  Patient was in the ER on 04/16/2023 with abdominal pain, nausea and vomiting and had a CAT scan of the abdomen pelvis without contrast which showed small sliding-type hiatal hernia and diverticulosis without diverticulitis otherwise unremarkable.  She was treated with IV fluids and discharged home. As per daughter she continued to have 4-5 episodes of diarrhea during the day but this contained pinkish bloody output and her daughter was concerned about a GI bleed. Patient also complained of back pain. She denies unintentional weight loss or loss of appetite. She has some symptoms of indigestion but denies difficulty swallowing or pain on swallowing.  In the ER she was found to have acute on chronic kidney disease thought to be related to dehydration, prerenal with very low potassium and low magnesium.  Last seen in office by Dr. Levora Angel on 08/08/2022 for chronic diarrhea and advised to continue colestipol and Creon and decreased dosage of Dexilant for chronic GERD.  Previous GI workup: History of chronic diarrhea since 2002 with questionable history of Crohn's ileitis s/p right hemocolectomy and ileotransverse colonic anastomosis. Tissue specimen from biopsy and surgical margin did not show any evidence of Crohn's disease. Positive P ASCA but no improvement in diarrhea with Entocort, prednisone, and ASA compounds making Crohn's less likely. Diarrhea has been controlled in the past with Colestid.  EGD, Dr.Gessner 08/2019:  esophagitis  Colonoscopy 05/05/2018 (with [place]) - Patent end-to-end colonic anastomosis, healthy appearing mucosa. Internal and external hemorrhoids. No repeat due to age.  EGD 09/08/2020 (inpatient, Dr. Matthias Hughs) - 2 cm hiatal hernia, mild chronic gastritis, negative for H. Pylori and celiac  Colonoscopy, 06/2008, Dr. Leone Payor: Changes of prior right hemicolectomy, repeat recommended in 10 years, biopsies from terminal ileum unremarkable, random colon biopsies unremarkable.  EGD, 2003, Dr. Russella Dar: Esophagitis, hiatal hernia, no evidence of celiac, no evidence of H. Pylori  Pathology 12/06/2001: 32 cm of distal ileum and 2 cm of cecum removed.  Terminal ileum, cecum, appendix, segmental resection showed small bowel obstruction associated with adhesions, no acute inflammation or malignancy  Pathology 12/2000: Unremarkable biopsies from terminal ileum and random colon  Past Medical History:  Diagnosis Date   Abdominal discomfort    Chronic N/V/D. Presumptive dx Crohn's dx per elevated p ANCA. Failed Entocort and Pentasa. Sep 2003 - ileocolectomy c anastomosis per Dr Orson Slick 2/2 adhesions - path was hegative for Crohns. EGD, Sm bowel follow through (11/03), and an eteroclysis (10/03) were unrevealing. Cuases hypomag and hypocalcemia.   Adnexal mass 8/03   s/p lap BSO (R ovarian fibroma) & lysis of adhesions   Allergy    Seasonal   Anemia    Multifactorial. Baseline HgB 10-11 ish. B12 def - 150 in 3/10. Fe Def - ferritin 35 3/10. Both are being repleted.   Breast cancer (HCC) 03/16/13   right, 5 o'clock   CAD (coronary artery disease) 1996   1996 - PTCA and angioplasty diagonal branch. 2000 - Rotoblator & angiopllasty of diagonal. 2006 - subendocardial AMI, DES to proximal LAD.Marland Kitchen Also had 90% stenosis in distal apical LAD. EF 55 with apical hypokinesis. Indefinite ASA and  Plavix.   CHF (congestive heart failure) (HCC)    Chronic kidney disease    Chronic renal insuff baseline Cr 1.2 - 1.4 ish.    Chronic kidney disease, stage III (moderate) (HCC)    Chronic renal insuff baseline Cr 1.2 - 1.4 ish. New baseline about 2 (2018)   Chronic pain    CT 10/10 = Spinal stenosis L2 - S1.   COVID-19 11/02/2020   Diabetes mellitus    Insulin dependent   Diabetes mellitus type 2 in obese 05/03/2010   Managed on lantus and novolog. Has diabetic nephropathy. Metformin D/C'd 2012 2/2 creatinine.  No diabetic retinopathy per 3/11.    Gout    Hx of radiation therapy 06/02/13- 07/16/13   right rbeast 4500 cGy 25 sessions, right breast boost 1600 cGy in 8 sessions   Hyperlipidemia    Managed with both a statin and Welchol. Welchol stopped 2014 2/2 cost and started on fenofibrate    Hypertension    2006 B renal arteries patent. 2003 MRA - no RAS. 2003 pheo W/U Dr Caryn Section reportedly negative.   Hypoxia 07/23/2017   Lupus    Lymphedema of breast    Personal history of radiation therapy 2015   Pneumonia of left lung due to infectious organism    RBBB    Renal vein thrombosis (HCC)    SBO (small bowel obstruction) (HCC) 09/17/2017   Secondary hyperparathyroidism (HCC)    Stroke (HCC)    Incidental finding MRI 2002 L lacunar infarct   Vitamin B12 deficiency    Vitamin D deficiency    Wears dentures    top    Past Surgical History:  Procedure Laterality Date   ABDOMINAL HYSTERECTOMY     BILATERAL SALPINGOOPHORECTOMY  8/03   Lap BSO (R ovarian fibroma) and adhesion lysis   BIOPSY  09/08/2020   Procedure: BIOPSY;  Surgeon: Bernette Redbird, MD;  Location: [facility];  Service: Endoscopy;;   BOWEL RESECTION  2003   ileocolectomy with anastomosis 2/2 adhesions   BREAST BIOPSY Right 2015   BREAST BIOPSY Right 07/2014   BREAST LUMPECTOMY Right 04/22/2013   BREAST LUMPECTOMY WITH NEEDLE LOCALIZATION AND AXILLARY SENTINEL LYMPH NODE BX Right 04/22/2013   Procedure: BREAST LUMPECTOMY WITH NEEDLE LOCALIZATION AND AXILLARY SENTINEL LYMPH NODE BX;  Surgeon: Almond Lint, MD;  Location: [facility];   Service: General;  Laterality: Right;   CARDIAC CATHETERIZATION     2 stents   CHOLECYSTECTOMY     COLONOSCOPY     ESOPHAGOGASTRODUODENOSCOPY N/A 09/08/2020   Procedure: ESOPHAGOGASTRODUODENOSCOPY (EGD);  Surgeon: Bernette Redbird, MD;  Location: [facility];  Service: Endoscopy;  Laterality: N/A;   HEMICOLECTOMY     R sided hemicolectomy   HERNIA REPAIR     Ventral hernia repair   PTCA  4/06    Prior to Admission medications   Medication Sig Start Date End Date Taking? Authorizing Provider  acetaminophen (TYLENOL) 500 MG tablet Take 1 tablet (500 mg total) by mouth every 6 (six) hours as needed. Patient taking differently: Take 1,000 mg by mouth every 6 (six) hours as needed for mild pain (pain score 1-3), headache or fever. 05/11/22  Yes Nanavati, Ankit, MD  amLODipine (NORVASC) 10 MG tablet Take 1 tablet (10 mg total) by mouth daily. 10/26/22  Yes   apixaban (ELIQUIS) 2.5 MG TABS tablet Take 1 tablet (2.5 mg total) by mouth 2 (two) times daily. 10/30/21  Yes Arrien, York Ram, MD  famotidine (PEPCID) 20 MG tablet Take 20  mg by mouth daily as needed for heartburn or indigestion. 10/14/21  Yes [provider]  FLUoxetine (PROZAC) 20 MG capsule Take 20 mg by mouth daily. 05/05/23  Yes [provider]  furosemide (LASIX) 40 MG tablet Take 1 tablet (40 mg total) by mouth daily. 04/09/23  Yes   gabapentin (NEURONTIN) 300 MG capsule Take 300 mg by mouth at bedtime. 05/02/23  Yes [provider]  loperamide (IMODIUM) 2 MG capsule Take 2 mg by mouth as needed for diarrhea or loose stools. 02/01/23  Yes [provider]  mirtazapine (REMERON) 7.5 MG tablet Take 7.5 mg by mouth at bedtime. 04/22/23  Yes [provider]  nitroGLYCERIN (NITROSTAT) 0.4 MG SL tablet DISSOLVE 1 TABLET UNDER THE TONGUE EVERY 5 MINUTES AS  NEEDED FOR CHEST PAIN. MAX  OF 3 TABLETS IN 15 MINUTES. CALL 911 IF PAIN PERSISTS. 10/04/20  Yes Jake Bathe, MD  pantoprazole (PROTONIX) 40  MG tablet Take 1 tablet (40 mg total) by mouth 2 (two) times daily. 12/23/21  Yes Jacalyn Lefevre, MD  predniSONE (DELTASONE) 10 MG tablet Take 30 mg by mouth daily. 05/01/23  Yes [provider]  rivastigmine (EXELON) 1.5 MG capsule Take 1 capsule (1.5 mg total) by mouth 2 (two) times daily. 12/27/22 07/25/23 Yes Camara, Amalia Hailey, MD  tiZANidine (ZANAFLEX) 2 MG tablet Take 2 mg by mouth at bedtime as needed. 04/09/23  Yes [provider]  hydrALAZINE (APRESOLINE) 50 MG tablet Take 1 tablet (50 mg total) by mouth in the morning and at bedtime. Patient not taking: Reported on 05/08/2023 12/15/22 03/30/23  Horton, Clabe Seal, DO    Current Facility-Administered Medications  Medication Dose Route Frequency Provider Last Rate Last Admin   acetaminophen (TYLENOL) tablet 650 mg  650 mg Oral Q6H PRN Opyd, Lavone Neri, MD       Or   acetaminophen (TYLENOL) suppository 650 mg  650 mg Rectal Q6H PRN Opyd, Lavone Neri, MD       amLODipine (NORVASC) tablet 10 mg  10 mg Oral Daily Opyd, Lavone Neri, MD   10 mg at 05/09/23 1257   apixaban (ELIQUIS) tablet 2.5 mg  2.5 mg Oral BID Opyd, Lavone Neri, MD   2.5 mg at 05/09/23 2300   diphenoxylate-atropine (LOMOTIL) 2.5-0.025 MG per tablet 1 tablet  1 tablet Oral BID Toni Amend W, DO       insulin aspart (novoLOG) injection 0-6 Units  0-6 Units Subcutaneous TID WC Opyd, Lavone Neri, MD       loperamide (IMODIUM) capsule 2 mg  2 mg Oral PRN Deanna Artis, DO       magnesium oxide (MAG-OX) tablet 800 mg  800 mg Oral BID Toni Amend W, DO   800 mg at 05/09/23 2300   magnesium sulfate IVPB 2 g 50 mL  2 g Intravenous Once Calkins, Derek W, DO       mirtazapine (REMERON) tablet 15 mg  15 mg Oral QHS Opyd, Lavone Neri, MD   15 mg at 05/09/23 2300   pantoprazole (PROTONIX) EC tablet 40 mg  40 mg Oral BID Opyd, Lavone Neri, MD   40 mg at 05/09/23 2300   potassium chloride (KLOR-CON) packet 40 mEq  40 mEq Oral Once Toni Amend W, DO       potassium chloride 10 mEq in  100 mL IVPB  10 mEq Intravenous Q1 Hr x 3 Calkins, Derek W, DO 100 mL/hr at 05/10/23 0900 10 mEq at 05/10/23 0900   sodium  bicarbonate 150 mEq in dextrose 5 % 1,150 mL infusion   Intravenous Continuous Deanna Artis, DO        Allergies as of 05/08/2023 - Review Complete 05/08/2023  Allergen Reaction Noted   Anesthetics, halogenated Other (See Comments) 10/25/2017   Etomidate Other (See Comments) 05/05/2018   Fentanyl Other (See Comments) 05/05/2018   Percocet [oxycodone-acetaminophen] Other (See Comments) 05/05/2013   Diazepam Other (See Comments) 04/17/2006   Lorazepam Nausea Only 07/08/2020   Tramadol hcl Nausea And Vomiting 06/10/2020   Haloperidol Nausea And Vomiting 07/08/2020   Morphine sulfate Nausea Only 07/08/2020   Propoxyphene Nausea And Vomiting 07/08/2020   Versed [midazolam] Other (See Comments) 05/05/2018    Family History  Problem Relation Age of Onset   Breast cancer Mother 79   Pancreatic cancer Brother 39   Lung cancer Maternal Aunt    Breast cancer Maternal Aunt    Kidney failure Maternal Aunt    Prostate cancer Maternal Uncle    Heart attack Maternal Grandmother    Breast cancer Maternal Grandmother    Colon cancer Maternal Grandmother    Clotting disorder Maternal Grandmother    Diabetes Maternal Grandmother    Hypertension Daughter    Pancreatic cancer Other     Social History   Socioeconomic History   Marital status: Widowed    Spouse name: Not on file   Number of children: 5   Years of education: Not on file   Highest education level: High school graduate  Occupational History   Occupation: retired    Associate Professor: [employer]  Tobacco Use   Smoking status: Never   Smokeless tobacco: Never  Vaping Use   Vaping status: Never Used  Substance and Sexual Activity   Alcohol use: No    Alcohol/week: 0.0 standard drinks of alcohol   Drug use: No   Sexual activity: Not on file  Other Topics Concern   Not on file  Social History  Narrative   She is separated 2 sons 3 daughters   She has grandchildren and great-grandchildren   She is retired   Never smoker no tobacco now no drugs no alcohol no caffeine   Right handed   Social Drivers of Corporate investment banker Strain: Low Risk  (10/25/2021)   Overall Financial Resource Strain (CARDIA)    Difficulty of Paying Living Expenses: Not very hard  Food Insecurity: No Food Insecurity (05/09/2023)   Hunger Vital Sign    Worried About Running Out of Food in the Last Year: Never true    Ran Out of Food in the Last Year: Never true  Transportation Needs: No Transportation Needs (05/09/2023)   PRAPARE - Administrator, Civil Service (Medical): No    Lack of Transportation (Non-Medical): No  Physical Activity: Not on file  Stress: Not on file  Social Connections: Socially Isolated (05/09/2023)   Social Connection and Isolation Panel [NHANES]    Frequency of Communication with Friends and Family: More than three times a week    Frequency of Social Gatherings with Friends and Family: Three times a week    Attends Religious Services: Never    Active Member of Clubs or Organizations: No    Attends Banker Meetings: Never    Marital Status: Separated  Intimate Partner Violence: Not At Risk (05/09/2023)   Humiliation, Afraid, Rape, and Kick questionnaire    Fear of Current or Ex-Partner: No    Emotionally Abused: No    Physically  Abused: No    Sexually Abused: No    Review of Systems: As per HPI Physical Exam: Vital signs in last 24 hours: Temp:  [98 F (36.7 C)-98.2 F (36.8 C)] 98 F (36.7 C) (02/21 0601) Pulse Rate:  [74-89] 85 (02/21 0801) Resp:  [8-18] 18 (02/21 0601) BP: (132-171)/(66-90) 171/71 (02/21 0801) SpO2:  [96 %-100 %] 96 % (02/21 0801) Last BM Date : 05/09/23  General:   Alert,  Well-developed, well-nourished, pleasant and cooperative in NAD Head:  Normocephalic and atraumatic. Eyes:  Sclera clear, no icterus.  Mild  pallor Ears:  Normal auditory acuity. Nose:  No deformity, discharge,  or lesions. Mouth:  No deformity or lesions.  Oropharynx pink & moist. Neck:  Supple; no masses or thyromegaly. Lungs:  Clear throughout to auscultation.   No wheezes, crackles, or rhonchi. No acute distress. Heart:  Regular rate and rhythm; no murmurs, clicks, rubs,  or gallops. Extremities:  Without clubbing or edema. Neurologic:  Alert and  oriented x4;  grossly normal neurologically. Skin:  Intact without significant lesions or rashes. Psych:  Alert and cooperative. Normal mood and affect. Abdomen:  Soft, nontender and nondistended. No masses, hepatosplenomegaly or hernias noted. Normal bowel sounds, without guarding, and without rebound.         Lab Results: Recent Labs    05/08/23 1833 05/09/23 0442 05/10/23 0527  WBC 16.8* 13.8* 14.0*  HGB 10.5* 9.6* 9.1*  HCT 34.0* 30.1* 27.5*  PLT 465* 345 343   BMET Recent Labs    05/09/23 0442 05/09/23 2132 05/10/23 0527  NA 135 136 139  K 2.6* 3.2* 2.9*  CL 102 102 105  CO2 16* 16* 16*  GLUCOSE 76 150* 84  BUN 122* 122* 115*  CREATININE 5.21* 5.10* 4.53*  CALCIUM 6.8* 7.1* 7.4*   LFT Recent Labs    05/08/23 1833 05/10/23 0527  PROT 7.2  --   ALBUMIN 2.9* 2.3*  AST 13*  --   ALT 13  --   ALKPHOS 60  --   BILITOT 0.5  --    PT/INR No results for input(s): "LABPROT", "INR" in the last 72 hours.  Studies/Results: No results found.  Impression: Chronic diarrhea, stool studies negative for C. difficile and GI pathogen panel As per documentation patient has had multiple EGDs with small bowel biopsies, colonoscopy with random colon biopsies, without an obvious cause for diarrhea and was advised to take colestipol and Creon as an outpatient.   There is reported history of blood in stool however patient's hemoglobin has been stable at 9.6/9.1 and recent CAT scan of the abdomen and pelvis are unremarkable. After a colonoscopy in 2020, she was  recommended no further follow-up colonoscopy needed due to age.  Hypokalemia, potassium 2.9 Hypomagnesemia, 1.6 Renal impairment, BUN 115/creatinine 4.53, GFR 10 Mild acidosis, bicarb 16 Elevated lipase of 132 without obvious changes of pancreas noted on recent imaging  Plan: I will restart patient on colestipol 1 g 3 times a day, which can be increased if needed. Primary team has started her on Lomotil 2.5/0.025 mg 1 tablet twice a day. I will also resume Creon. She has been started on oral magnesium, which may actually worsen diarrhea and recommend IV magnesium for electrolyte replacement. Dr. Dulce Sellar to follow in a.m..   LOS: 2 days   Kerin Salen, MD  05/10/2023, 9:58 AM

## 2023-05-10 NOTE — Progress Notes (Signed)
  Progress Note   Patient: [name] ZOX:[id number] DOB: 1945-05-17 DOA: 05/08/2023     2 DOS: the patient was seen and examined on 05/10/2023   Brief hospital course:  78 y.o. 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   Assessment and Plan:  Acute kidney injury on CKD 4 - Baseline creatinine as low was 2.56 months ago.  Nearly 6 on presentation.  Likely prerenal etiology.  Continuing on bicarb drip.  Continue to monitor urine output and recheck panel in AM.  Mixed acid-base disorder - Likely secondary to chronic diarrhea and superimposed metabolic acidosis from CKD.  Currently on bicarb drip, showing improvement.  Will recheck BMP in AM.  Hypokalemia with severe hypomagnesemia - Likely secondary to GI loss.  Continue aggressive IV magnesium supplementation.  Potassium supplementation on board as well.  Order telemetry until electrolyte abnormalities resolved.  Intractable acute on chronic diarrhea - Patient stated she has had loose stools for upwards of 2 weeks.  C. difficile and GI pathogen profile negative.  GI consulted and following closely.  Patient with known history of chronic diarrhea since 2002 after right hemicolectomy and ileotransverse colonic anastomosis, status post multiple EGDs/colonoscopies.  Initiated Lomotil twice daily.  Per GI recommendations, restarting Creon and colestipol.  Hold off on p.o. magnesium as to not exacerbate diarrhea.  History of lower extremity DVT and renal artery thrombosis - Continue Eliquis  Chronic HFpEF - Does not appear to be in exacerbation.  Will monitor closely given IV fluid resuscitation.  Diabetes mellitus - Insulin sliding scale on board.  Depression/memory loss - Remeron on board.  Physical debilitation and muscle weakness -  Exacerbated by severe electrolyte depletion.  Will evaluate for PT after electrolytes corrected.     Subjective: Patient laying in bed comfortably this morning.  States she multiple bowel movements since I visited her yesterday, nearly 57.  No other symptoms such as fever, shortness of breath, chest pain, nausea, vomiting, abdominal pain.    Physical Exam: Vitals:   05/09/23 1740 05/09/23 2046 05/10/23 0601 05/10/23 0801  BP: 132/67 137/69 (!) 151/80 (!) 171/71  Pulse: 89 86 87 85  Resp:  18 18   Temp:  98.2 F (36.8 C) 98 F (36.7 C)   TempSrc:  Oral Oral   SpO2: 99% 100% 100% 96%  Weight:      Height:       GENERAL:  Alert, pleasant, no acute distress  HEENT:  EOMI CARDIOVASCULAR:  RRR, no murmurs appreciated RESPIRATORY:  Clear to auscultation, no wheezing, rales, or rhonchi GASTROINTESTINAL:  Soft, nontender, nondistended EXTREMITIES:  No LE edema bilaterally NEURO:  No new focal deficits appreciated SKIN:  No rashes noted PSYCH:  Appropriate mood and affect   Data Reviewed:  There are no new results to review at this time.  Family Communication: None at bedside  Disposition: Status is: Inpatient Remains inpatient appropriate because: Severe hypokalemia/hypomagnesemia and AKI  Planned Discharge Destination:  To be determined by PT for electrolyte replenishment    Time spent: 36 minutes  Author: Deanna Artis, DO 05/10/2023 2:58 PM  For on call review [email or web address].

## 2023-05-10 NOTE — Plan of Care (Signed)

## 2023-05-11 DIAGNOSIS — N184 Chronic kidney disease, stage 4 (severe): Secondary | ICD-10-CM | POA: Diagnosis not present

## 2023-05-11 DIAGNOSIS — N179 Acute kidney failure, unspecified: Secondary | ICD-10-CM | POA: Diagnosis not present

## 2023-05-11 DIAGNOSIS — E860 Dehydration: Secondary | ICD-10-CM | POA: Diagnosis not present

## 2023-05-11 LAB — BASIC METABOLIC PANEL
Anion gap: 18 — ABNORMAL HIGH (ref 5–15)
BUN: 96 mg/dL — ABNORMAL HIGH (ref 8–23)
CO2: 23 mmol/L (ref 22–32)
Calcium: 8.9 mg/dL (ref 8.9–10.3)
Chloride: 100 mmol/L (ref 98–111)
Creatinine, Ser: 3.59 mg/dL — ABNORMAL HIGH (ref 0.44–1.00)
GFR, Estimated: 13 mL/min — ABNORMAL LOW (ref >60)
Glucose, Bld: 135 mg/dL — ABNORMAL HIGH (ref 70–99)
Potassium: 3.7 mmol/L (ref 3.5–5.1)
Sodium: 141 mmol/L (ref 135–145)

## 2023-05-11 LAB — CBC
HCT: 29.4 % — ABNORMAL LOW (ref 36.0–46.0)
Hemoglobin: 9.7 g/dL — ABNORMAL LOW (ref 12.0–15.0)
MCH: 27.6 pg (ref 26.0–34.0)
MCHC: 33 g/dL (ref 30.0–36.0)
MCV: 83.5 fL (ref 80.0–100.0)
Platelets: 341 10*3/uL (ref 150–400)
RBC: 3.52 MIL/uL — ABNORMAL LOW (ref 3.87–5.11)
RDW: 19.2 % — ABNORMAL HIGH (ref 11.5–15.5)
WBC: 13.8 10*3/uL — ABNORMAL HIGH (ref 4.0–10.5)
nRBC: 0.2 % (ref 0.0–0.2)

## 2023-05-11 LAB — GLUCOSE, CAPILLARY
Glucose-Capillary: 106 mg/dL — ABNORMAL HIGH (ref 70–99)
Glucose-Capillary: 155 mg/dL — ABNORMAL HIGH (ref 70–99)
Glucose-Capillary: 184 mg/dL — ABNORMAL HIGH (ref 70–99)

## 2023-05-11 LAB — MAGNESIUM: Magnesium: 1.9 mg/dL (ref 1.7–2.4)

## 2023-05-11 NOTE — Plan of Care (Signed)

## 2023-05-11 NOTE — Progress Notes (Signed)
 Progress Note   Patient: [name] ZOX:[id number] DOB: [birth year] DOA: 05/08/2023     76 DOS: the patient was seen and examined on 05/11/2023   Brief hospital course:  78 y.o. 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   Assessment and Plan:  Acute kidney injury on CKD 4 - Baseline creatinine as low was 2.56 months ago.  Nearly 6 on presentation.  Likely prerenal etiology.  Today we will discontinue bicarb drip.  Patient tolerating p.o.  Continue to monitor urine output and recheck panel in AM.  Mixed acid-base disorder - Likely secondary to chronic diarrhea and superimposed metabolic acidosis from CKD.  Acidemia appears resolved so we will discontinue bicarb drip.  Patient tolerating p.o.  Will recheck BMP in AM.  Hypokalemia with severe hypomagnesemia - Likely secondary to GI loss.  Received aggressive IV magnesium supplementation.  Potassium supplementation on board as well.  Appears to show marked improvement this morning.  Will discontinue telemetry for now.  Intractable acute on chronic diarrhea - Patient stated she has had loose stools for upwards of 2 weeks.  C. difficile and GI pathogen profile negative.  GI consulted and following closely.  Patient with known history of chronic diarrhea since 2002 after right hemicolectomy and ileotransverse colonic anastomosis, status post multiple EGDs/colonoscopies.  Continues on Lomotil twice daily.  Per GI recommendations, restarting Creon and colestipol.  Hold off on p.o. magnesium as to not exacerbate diarrhea.  Appears to be showing improvement.  History of lower extremity DVT and renal artery thrombosis - Continue Eliquis  Chronic HFpEF - Does not appear to be in exacerbation.  Will monitor closely given IV fluid  resuscitation.  Diabetes mellitus - Insulin sliding scale on board.  Depression/memory loss - Remeron on board.  Physical debilitation and muscle weakness - Exacerbated by severe electrolyte depletion.  Now that electrolyte abnormalities appear to be mostly resolved, will order PT eval.     Subjective: Patient laying in bed comfortably this morning.  States she minimal bowel movements since I visited her yesterday, and none today this morning.  No other symptoms such as fever, shortness of breath, chest pain, nausea, vomiting, abdominal pain.  Feeling improved.  Physical Exam: Vitals:   05/10/23 0801 05/10/23 1644 05/10/23 2231 05/11/23 0425  BP: (!) 171/71 (!) 145/75 (!) 149/63 (!) 156/69  Pulse: 85 89 88 89  Resp:      Temp:   97.8 F (36.6 C) 98.2 F (36.8 C)  TempSrc:   Oral Oral  SpO2: 96% 100% 100% 99%  Weight:    70.3 kg  Height:       GENERAL:  Alert, pleasant, no acute distress  HEENT:  EOMI CARDIOVASCULAR:  RRR, no murmurs appreciated RESPIRATORY:  Clear to auscultation, no wheezing, rales, or rhonchi GASTROINTESTINAL:  Soft, nontender, nondistended EXTREMITIES:  No LE edema bilaterally NEURO:  No new focal deficits appreciated SKIN:  No rashes noted PSYCH:  Appropriate mood and affect   Data Reviewed:  There are no new results to review at this time.  Family Communication: None at bedside  Disposition: Status is: Inpatient Remains inpatient appropriate because: Severe hypokalemia/hypomagnesemia and AKI  Planned Discharge Destination:  To be determined by PT for electrolyte replenishment    Time spent: 32 minutes  Author: Deanna Artis, DO 05/11/2023 1:43 PM  For on call  review [email or web address].

## 2023-05-11 NOTE — Progress Notes (Signed)
 Subjective: Diarrhea resolved.  Objective: Vital signs in last 24 hours: Temp:  [97.8 F (36.6 C)-98.2 F (36.8 C)] 98.2 F (36.8 C) (02/22 0425) Pulse Rate:  [88-89] 89 (02/22 0425) BP: (145-156)/(63-75) 156/69 (02/22 0425) SpO2:  [99 %-100 %] 99 % (02/22 0425) Weight:  [70.3 kg] 70.3 kg (02/22 0425) Weight change:  Last BM Date : 05/10/23  PE: GEN:  NAD  Lab Results: CBC    Component Value Date/Time   WBC 13.8 (H) 05/11/2023 0412   RBC 3.52 (L) 05/11/2023 0412   HGB 9.7 (L) 05/11/2023 0412   HGB 9.4 (L) 04/18/2023 1434   HGB 9.3 (L) 03/14/2017 0810   HCT 29.4 (L) 05/11/2023 0412   HCT 26.4 (L) 07/21/2019 0809   HCT 29.2 (L) 03/14/2017 0810   PLT 341 05/11/2023 0412   PLT 235 04/18/2023 1434   PLT 292 03/14/2017 0810   PLT 351 10/11/2016 1139   MCV 83.5 05/11/2023 0412   MCV 81.3 03/14/2017 0810   MCH 27.6 05/11/2023 0412   MCHC 33.0 05/11/2023 0412   RDW 19.2 (H) 05/11/2023 0412   RDW 14.0 03/14/2017 0810   LYMPHSABS 0.8 04/18/2023 1434   LYMPHSABS 1.4 03/14/2017 0810   MONOABS 0.9 04/18/2023 1434   MONOABS 0.4 03/14/2017 0810   EOSABS 0.0 04/18/2023 1434   EOSABS 0.1 03/14/2017 0810   BASOSABS 0.0 04/18/2023 1434   BASOSABS 0.0 03/14/2017 0810  CMP     Component Value Date/Time   NA 141 05/11/2023 0412   NA 144 07/19/2020 1458   NA 140 03/14/2017 0810   K 3.7 05/11/2023 0412   K 3.8 03/14/2017 0810   CL 100 05/11/2023 0412   CO2 23 05/11/2023 0412   CO2 27 03/14/2017 0810   GLUCOSE 135 (H) 05/11/2023 0412   GLUCOSE 177 (H) 03/14/2017 0810   BUN 96 (H) 05/11/2023 0412   BUN 85 (HH) 07/19/2020 1458   BUN 35.6 (H) 03/14/2017 0810   CREATININE 3.59 (H) 05/11/2023 0412   CREATININE 2.31 (H) 10/25/2022 1355   CREATININE 1.8 (H) 03/14/2017 0810   CALCIUM 8.9 05/11/2023 0412   CALCIUM 9.6 03/14/2017 0810   PROT 7.2 05/08/2023 1833   PROT 7.8 03/14/2017 0810   ALBUMIN 2.3 (L) 05/10/2023 0527   ALBUMIN 3.5 03/14/2017 0810   AST 13 (L) 05/08/2023 1833    AST 12 (L) 10/25/2022 1355   AST 17 03/14/2017 0810   ALT 13 05/08/2023 1833   ALT 9 10/25/2022 1355   ALT 17 03/14/2017 0810   ALKPHOS 60 05/08/2023 1833   ALKPHOS 61 03/14/2017 0810   BILITOT 0.5 05/08/2023 1833   BILITOT 0.3 10/25/2022 1355   BILITOT 0.28 03/14/2017 0810   GFR 28.35 (L) 10/25/2017 1233   EGFR 18 (L) 07/19/2020 1458   GFRNONAA 13 (L) 05/11/2023 0412   GFRNONAA 22 (L) 10/25/2022 1355   GFRNONAA 45 (L) 12/17/2013 1158    Assessment:   Diarrhea, resolved. Acute on chronic kidney disease.  Plan:   Creon and colestipol at current doses upon discharge. No further GI work-up needed; further management of acute kidney disease, etc, per primary team. Advance diet as tolerated. [name] will sign-off; patient can follow-up with her primary gastroenterologist [name] upon hospital discharge; thank you for the consultation.   [name] 05/11/2023, 2:39 PM   Cell [phone] If no answer or after 5 PM call [phone]

## 2023-05-12 DIAGNOSIS — E860 Dehydration: Secondary | ICD-10-CM | POA: Diagnosis not present

## 2023-05-12 DIAGNOSIS — N179 Acute kidney failure, unspecified: Secondary | ICD-10-CM | POA: Diagnosis not present

## 2023-05-12 DIAGNOSIS — E876 Hypokalemia: Secondary | ICD-10-CM | POA: Diagnosis not present

## 2023-05-12 DIAGNOSIS — N184 Chronic kidney disease, stage 4 (severe): Secondary | ICD-10-CM | POA: Diagnosis not present

## 2023-05-12 LAB — CBC
HCT: 29.8 % — ABNORMAL LOW (ref 36.0–46.0)
Hemoglobin: 9.3 g/dL — ABNORMAL LOW (ref 12.0–15.0)
MCH: 27 pg (ref 26.0–34.0)
MCHC: 31.2 g/dL (ref 30.0–36.0)
MCV: 86.4 fL (ref 80.0–100.0)
Platelets: 360 10*3/uL (ref 150–400)
RBC: 3.45 MIL/uL — ABNORMAL LOW (ref 3.87–5.11)
RDW: 19.7 % — ABNORMAL HIGH (ref 11.5–15.5)
WBC: 14.2 10*3/uL — ABNORMAL HIGH (ref 4.0–10.5)
nRBC: 0 % (ref 0.0–0.2)

## 2023-05-12 LAB — GLUCOSE, CAPILLARY
Glucose-Capillary: 94 mg/dL (ref 70–99)
Glucose-Capillary: 162 mg/dL — ABNORMAL HIGH (ref 70–99)
Glucose-Capillary: 103 mg/dL — ABNORMAL HIGH (ref 70–99)
Glucose-Capillary: 184 mg/dL — ABNORMAL HIGH (ref 70–99)

## 2023-05-12 LAB — MAGNESIUM: Magnesium: 1.8 mg/dL (ref 1.7–2.4)

## 2023-05-12 LAB — BASIC METABOLIC PANEL
Anion gap: 9 (ref 5–15)
BUN: 81 mg/dL — ABNORMAL HIGH (ref 8–23)
CO2: 27 mmol/L (ref 22–32)
Calcium: 8.7 mg/dL — ABNORMAL LOW (ref 8.9–10.3)
Chloride: 105 mmol/L (ref 98–111)
Creatinine, Ser: 3.48 mg/dL — ABNORMAL HIGH (ref 0.44–1.00)
GFR, Estimated: 13 mL/min — ABNORMAL LOW (ref >60)
Glucose, Bld: 106 mg/dL — ABNORMAL HIGH (ref 70–99)
Potassium: 4 mmol/L (ref 3.5–5.1)
Sodium: 141 mmol/L (ref 135–145)

## 2023-05-12 NOTE — Assessment & Plan Note (Deleted)
 Started receiving monthly B12 injections in our clinic on 05/04/19, currently on every 6 weeks

## 2023-05-12 NOTE — Assessment & Plan Note (Deleted)
 invasive ductal carcinoma with DCIS, [id number], stage IA,   ER+ and PR +, HER 2 negative -diagnosed 02/2013, s/p right lumpectomy, adjuvant radiation and Anastrozole for 4 years until 07/2017.  Anastrozole was stopped after she developed septic shock from pneumonia and a cardiac arrest, and we decided not restart due to the risk of cardiac toxicity. -Her 07/11/20 Mammogram showed suspicious right breast mass. She underwent breast biopsy on 07/28/20 which showed fibroadipose and necrosis, no malignancy. This area is palpable on exam today. We will continue to monitor. -she is clinically doing well.

## 2023-05-12 NOTE — Progress Notes (Signed)
 Progress Note   Patient: [name] WUJ:[id number] DOB: 1945/05/02 DOA: 05/08/2023     4 DOS: the patient was seen and examined on 05/12/2023   Brief hospital course:  78 y.o. 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   Assessment and Plan:  Acute kidney injury on CKD 4 - Baseline creatinine as low was 2.56 months ago.  Nearly 6 on presentation.  Likely prerenal etiology.  No longer requiring bicarb drip.  Patient tolerating p.o.  Continue to monitor urine output and recheck panel in AM.  Continues to show improvement.  Will recheck BMP and mag in AM.  Mixed acid-base disorder - Likely secondary to chronic diarrhea and superimposed metabolic acidosis from CKD.  Acidemia appears resolved, discontinued bicarb drip.  Patient tolerating p.o.  Will recheck BMP in AM.  Hypokalemia with severe hypomagnesemia - Likely secondary to GI loss.  Received aggressive IV magnesium supplementation.  Potassium supplementation on board as well.  Appears to show marked improvement this morning.  Will discontinue telemetry for now.  Intractable acute on chronic diarrhea - Patient stated she has had loose stools for upwards of 2 weeks.  C. difficile and GI pathogen profile negative.  GI consulted and following closely.  Patient with known history of chronic diarrhea since 2002 after right hemicolectomy and ileotransverse colonic anastomosis, status post multiple EGDs/colonoscopies.  Continues on Lomotil twice daily.  Per GI recommendations, restarting Creon and colestipol.  Hold off on p.o. magnesium as to not exacerbate diarrhea.  Appears to be showing improvement.  Diarrhea appears to be resolved.  GI signing off.  History of lower extremity DVT and renal artery thrombosis - Continue Eliquis  Chronic  HFpEF - Does not appear to be in exacerbation.  Will monitor closely given IV fluid resuscitation.  Diabetes mellitus - Insulin sliding scale on board.  Depression/memory loss - Remeron on board.  Dysphagia - Reported by family.  Apparently patient admits to having food feels stuck when she tries to swallow.  Will order swallow eval.  No coughing or aspiration noted so far.  Physical debilitation and muscle weakness - Exacerbated by severe electrolyte depletion.  Now that electrolyte abnormalities appear to be mostly resolved, will order PT eval.     Subjective: Patient laying in bed comfortably this morning.  States she minimal bowel movements can, only 1.  Diarrhea appears resolved.  No other symptoms such as fever, shortness of breath, chest pain, nausea, vomiting, abdominal pain.  Feeling improved.  Family in the room at bedside, reporting that patient has intermittent difficulty with eating.  Stating that food sometimes gets stuck in her throat, limits how much she eats.  Physical Exam: Vitals:   05/11/23 1636 05/11/23 2203 05/12/23 0540 05/12/23 0827  BP: 138/65 (!) 143/65 (!) 159/60 (!) 182/70  Pulse: 89 82 77 79  Resp: 18 16 19 18   Temp: 98 F (36.7 C) 99.6 F (37.6 C) 98.1 F (36.7 C) 98.9 F (37.2 C)  TempSrc: Oral Oral Oral Oral  SpO2: 100% 99% 96% 97%  Weight:   69.4 kg   Height:       GENERAL:  Alert, pleasant, no acute distress  HEENT:  EOMI CARDIOVASCULAR:  RRR, no murmurs appreciated RESPIRATORY:  Clear to auscultation, no wheezing, rales, or rhonchi GASTROINTESTINAL:  Soft, nontender, nondistended EXTREMITIES:  No LE edema bilaterally NEURO:  No new focal deficits appreciated SKIN:  No rashes noted PSYCH:  Appropriate mood and affect   Data Reviewed:  There are no new results to review at this time.  Family Communication: None at bedside  Disposition: Status is: Inpatient Remains inpatient appropriate because: Severe hypokalemia/hypomagnesemia and  AKI  Planned Discharge Destination:  To be determined by PT for electrolyte replenishment    Time spent: 33 minutes  Author: Deanna Artis, DO 05/12/2023 2:54 PM  For on call review [email or web address].

## 2023-05-12 NOTE — Progress Notes (Deleted)
 Patient Care Team: Renford Dills, MD as PCP - General (Internal Medicine) Wendall Stade, MD as PCP - Cardiology (Cardiology) Blair Promise, OD as Consulting Physician (Optometry) Almond Lint, MD as Consulting Physician (General Surgery) Malachy Mood, MD as Consulting Physician (Hematology) Bufford Buttner, MD as Consulting Physician (Nephrology)  Clinic Day:  05/12/2023  Referring physician: Renford Dills, MD  ASSESSMENT & PLAN:   Assessment & Plan: Breast cancer of lower-inner quadrant of right female breast [facility]) invasive ductal carcinoma with DCIS, pT1bN0Mo, stage IA,   ER+ and PR +, HER 2 negative -diagnosed 02/2013, s/p right lumpectomy, adjuvant radiation and Anastrozole for 4 years until 07/2017.  Anastrozole was stopped after she developed septic shock from pneumonia and a cardiac arrest, and we decided not restart due to the risk of cardiac toxicity. -Her 07/11/20 Mammogram showed suspicious right breast mass. She underwent breast biopsy on 07/28/20 which showed fibroadipose and necrosis, no malignancy. This area is palpable on exam today. We will continue to monitor. -she is clinically doing well.   Iron deficiency anemia -Probably secondary to GI bleeding, also endoscopy was negative. -Her previous bone marrow biopsy in 2020 was negative. -She has been receiving IV iron as needed for ferritin less than 100.    B12 deficiency anemia Started receiving monthly B12 injections in our clinic on 05/04/19, currently on every 6 weeks      The patient understands the plans discussed today and is in agreement with them.  She knows to contact our office if she develops concerns prior to her next appointment.  I provided *** minutes of face-to-face time during this encounter and > 50% was spent counseling as documented under my assessment and plan.    Carlean Jews, NP  [facility] - A DEPT OF [facility] [address]  [address] [place] [address] Dept: [phone] Dept Fax: [phone]   No orders of the defined types were placed in this encounter.     CHIEF COMPLAINT:  CC: Right breast cancer, estrogen receptor positive, iron deficiency anemia  Current Treatment: Retacrit injection every 2 weeks as needed, B12 injection every 6 weeks.  Breast cancer surveillance  INTERVAL HISTORY:  Vidhi is here today for repeat clinical assessment. She denies fevers or chills. She denies pain. Her appetite is good. Her weight {Weight change:10426}.  I have reviewed the past medical history, past surgical history, social history and family history with the patient and they are unchanged from previous note.  ALLERGIES:  is allergic to anesthetics, halogenated; etomidate; fentanyl; percocet [oxycodone-acetaminophen]; diazepam; lorazepam; tramadol hcl; haloperidol; morphine sulfate; propoxyphene; and versed [midazolam].  MEDICATIONS:  No current facility-administered medications for this visit.   No current outpatient medications on file.   Facility-Administered Medications Ordered in Other Visits  Medication Dose Route Frequency Provider Last Rate Last Admin   acetaminophen (TYLENOL) tablet 650 mg  650 mg Oral Q6H PRN Opyd, Lavone Neri, MD       Or   acetaminophen (TYLENOL) suppository 650 mg  650 mg Rectal Q6H PRN Opyd, Lavone Neri, MD       amLODipine (NORVASC) tablet 10 mg  10 mg Oral Daily Opyd, Lavone Neri, MD   10 mg at 05/12/23 1025   apixaban (ELIQUIS) tablet 2.5 mg  2.5 mg Oral BID Opyd, Lavone Neri, MD   2.5 mg at 05/12/23 1025   colestipol (COLESTID) tablet 1 g  1 g Oral TID Kerin Salen, MD  1 g at 05/12/23 1709   diphenoxylate-atropine (LOMOTIL) 2.5-0.025 MG per tablet 1 tablet  1 tablet Oral BID Deanna Artis, DO   1 tablet at 05/12/23 1025   insulin aspart (novoLOG) injection 0-6 Units  0-6 Units Subcutaneous TID WC Opyd, Lavone Neri, MD   1 Units at 05/12/23 1306   lipase/protease/amylase (CREON)  capsule 36,000 Units  36,000 Units Oral TID Nash Shearer, MD   36,000 Units at 05/12/23 1709   mirtazapine (REMERON) tablet 15 mg  15 mg Oral QHS Opyd, Lavone Neri, MD   15 mg at 05/11/23 2230   pantoprazole (PROTONIX) EC tablet 40 mg  40 mg Oral BID Briscoe Deutscher, MD   40 mg at 05/12/23 1025    HISTORY OF PRESENT ILLNESS:   Oncology History Overview Note  Cancer Staging Breast cancer of lower-inner quadrant of right female breast [facility]) Staging form: Breast, AJCC 7th Edition - Clinical: Stage IA (T1b, N0, cM0) - Unsigned Staging comments: Staged at breast conference 03/25/13.  - Pathologic stage from 05/12/2013: Stage IA (T1b, N0, cM0) - Signed by Malachy Mood, MD on 06/21/2016     Breast cancer of lower-inner quadrant of right female breast (HCC)  02/27/2013 Mammogram   Diagnostic mammogram and ultrasound showed a 6 x 5 mm mass at 5:00 position. Axilla was negative by ultrasound.   03/16/2013 Initial Biopsy   Right breast 5:00 position biopsy showed invasive ductal carcinoma, low-grade   03/16/2013 Receptors her2   ER100% positive, PR 100% positive, HER-2 negative, Ki-67 20%   03/18/2013 Initial Diagnosis   Breast cancer of lower-inner quadrant of right female breast (HCC)   05/12/2013 Surgery   Right breast lumpectomy with sentinel lymph node biopsy   05/12/2013 Pathology Results   Right breast lumpectomy showed invasive ductal carcinoma, grade 2, 0.9 cm, 6 sentinel lymph nodes were all negative. Margins were negative.   05/12/2013 Oncotype testing   Oncotype recurrence score 19, intermediate risk, 10 year risk of distant recurrence 12% with tamoxifen for 5 years. Patient declined adjuvant chemotherapy.   06/02/2013 - 07/16/2013 Radiation Therapy   Adjuvant breast radiation with boost 1600Gy   08/06/2013 - 07/2017 Anti-estrogen oral therapy   Arimidex 1 mg daily. Stopped in 07/2017 due to acute illness    03/22/2017 Mammogram   IMPRESSION: No mammographic evidence for malignancy.    Iron deficiency anemia      REVIEW OF SYSTEMS:   Constitutional: Denies fevers, chills or abnormal weight loss Eyes: Denies blurriness of vision Ears, nose, mouth, throat, and face: Denies mucositis or sore throat Respiratory: Denies cough, dyspnea or wheezes Cardiovascular: Denies palpitation, chest discomfort or lower extremity swelling Gastrointestinal:  Denies nausea, heartburn or change in bowel habits Skin: Denies abnormal skin rashes Lymphatics: Denies new lymphadenopathy or easy bruising Neurological:Denies numbness, tingling or new weaknesses Behavioral/Psych: Mood is stable, no new changes  All other systems were reviewed with the patient and are negative.   VITALS:  There were no vitals taken for this visit.  Wt Readings from Last 3 Encounters:  05/12/23 153 lb (69.4 kg)  05/05/23 150 lb (68 kg)  12/27/22 173 lb (78.5 kg)    There is no height or weight on file to calculate BMI.  Performance status (ECOG): {CHL ONC [id number]  PHYSICAL EXAM:   GENERAL:alert, no distress and comfortable SKIN: skin color, texture, turgor are normal, no rashes or significant lesions EYES: normal, Conjunctiva are pink and non-injected, sclera clear OROPHARYNX:no exudate, no erythema and lips, buccal  mucosa, and tongue normal  NECK: supple, thyroid normal size, non-tender, without nodularity LYMPH:  no palpable lymphadenopathy in the cervical, axillary or inguinal LUNGS: clear to auscultation and percussion with normal breathing effort HEART: regular rate & rhythm and no murmurs and no lower extremity edema ABDOMEN:abdomen soft, non-tender and normal bowel sounds Musculoskeletal:no cyanosis of digits and no clubbing  NEURO: alert & oriented x 3 with fluent speech, no focal motor/sensory deficits  LABORATORY DATA:  I have reviewed the data as listed    Component Value Date/Time   NA 141 05/12/2023 0557   NA 144 07/19/2020 1458   NA 140 03/14/2017 0810   K 4.0 05/12/2023  0557   K 3.8 03/14/2017 0810   CL 105 05/12/2023 0557   CO2 27 05/12/2023 0557   CO2 27 03/14/2017 0810   GLUCOSE 106 (H) 05/12/2023 0557   GLUCOSE 177 (H) 03/14/2017 0810   BUN 81 (H) 05/12/2023 0557   BUN 85 (HH) 07/19/2020 1458   BUN 35.6 (H) 03/14/2017 0810   CREATININE 3.48 (H) 05/12/2023 0557   CREATININE 2.31 (H) 10/25/2022 1355   CREATININE 1.8 (H) 03/14/2017 0810   CALCIUM 8.7 (L) 05/12/2023 0557   CALCIUM 9.6 03/14/2017 0810   PROT 7.2 05/08/2023 1833   PROT 7.8 03/14/2017 0810   ALBUMIN 2.3 (L) 05/10/2023 0527   ALBUMIN 3.5 03/14/2017 0810   AST 13 (L) 05/08/2023 1833   AST 12 (L) 10/25/2022 1355   AST 17 03/14/2017 0810   ALT 13 05/08/2023 1833   ALT 9 10/25/2022 1355   ALT 17 03/14/2017 0810   ALKPHOS 60 05/08/2023 1833   ALKPHOS 61 03/14/2017 0810   BILITOT 0.5 05/08/2023 1833   BILITOT 0.3 10/25/2022 1355   BILITOT 0.28 03/14/2017 0810   GFRNONAA 13 (L) 05/12/2023 0557   GFRNONAA 22 (L) 10/25/2022 1355   GFRNONAA 45 (L) 12/17/2013 1158   GFRAA 21 (L) 12/21/2019 0845   GFRAA 52 (L) 12/17/2013 1158    No results found for: "SPEP", "UPEP"  Lab Results  Component Value Date   WBC 14.2 (H) 05/12/2023   NEUTROABS 16.2 (H) 04/18/2023   HGB 9.3 (L) 05/12/2023   HCT 29.8 (L) 05/12/2023   MCV 86.4 05/12/2023   PLT 360 05/12/2023      Chemistry      Component Value Date/Time   NA 141 05/12/2023 0557   NA 144 07/19/2020 1458   NA 140 03/14/2017 0810   K 4.0 05/12/2023 0557   K 3.8 03/14/2017 0810   CL 105 05/12/2023 0557   CO2 27 05/12/2023 0557   CO2 27 03/14/2017 0810   BUN 81 (H) 05/12/2023 0557   BUN 85 (HH) 07/19/2020 1458   BUN 35.6 (H) 03/14/2017 0810   CREATININE 3.48 (H) 05/12/2023 0557   CREATININE 2.31 (H) 10/25/2022 1355   CREATININE 1.8 (H) 03/14/2017 0810      Component Value Date/Time   CALCIUM 8.7 (L) 05/12/2023 0557   CALCIUM 9.6 03/14/2017 0810   ALKPHOS 60 05/08/2023 1833   ALKPHOS 61 03/14/2017 0810   AST 13 (L) 05/08/2023  1833   AST 12 (L) 10/25/2022 1355   AST 17 03/14/2017 0810   ALT 13 05/08/2023 1833   ALT 9 10/25/2022 1355   ALT 17 03/14/2017 0810   BILITOT 0.5 05/08/2023 1833   BILITOT 0.3 10/25/2022 1355   BILITOT 0.28 03/14/2017 0810       RADIOGRAPHIC STUDIES: I have personally reviewed the radiological images as listed and  agreed with the findings in the report. US Venous Img Lower  Left (DVT Study) Result Date: 05/05/2023 CLINICAL DATA:  Left knee pain for 2 days.  No known injury. EXAM: LEFT LOWER EXTREMITY VENOUS DOPPLER ULTRASOUND TECHNIQUE: Gray-scale sonography with compression, as well as color and duplex ultrasound, were performed to evaluate the deep venous system(s) from the level of the common femoral vein through the popliteal and proximal calf veins. COMPARISON:  None Available. FINDINGS: VENOUS Normal compressibility of the common femoral, superficial femoral, and popliteal veins, as well as the visualized calf veins. Visualized portions of profunda femoral vein and great saphenous vein unremarkable. No filling defects to suggest DVT on grayscale or color Doppler imaging. Doppler waveforms show normal direction of venous flow, normal respiratory plasticity and response to augmentation. Limited views of the contralateral common femoral vein are unremarkable. OTHER There is a 1.1 x 1.2 x 3.1 cm complex collection measured in the left medial popliteal fossa, which may represent popliteal/Baker cyst. Limitations: none IMPRESSION: *No acute deep vein thrombosis in the left lower extremity venous system. *Probable left popliteal/Baker's cyst measuring 1.1 x 1.2 x 3.1 cm. Electronically Signed   By: Jules Schick M.D.   On: 05/05/2023 16:46   DG Knee Complete 4 Views Left Result Date: 05/05/2023 CLINICAL DATA:  Left knee pain, no known injury. EXAM: LEFT KNEE - COMPLETE 4+ VIEW COMPARISON:  None Available. FINDINGS: Medial tibiofemoral joint space narrowing. Mild tricompartmental peripheral  spurring. Small quadriceps tendon enthesophyte. Chondrocalcinosis. No fracture, erosion, or focal bone abnormality. No significant knee joint effusion. There are vascular calcifications. IMPRESSION: 1. Mild tricompartmental osteoarthritis. 2. Chondrocalcinosis. Electronically Signed   By: Narda Rutherford M.D.   On: 05/05/2023 16:05   DG Chest 2 View Result Date: 05/05/2023 CLINICAL DATA:  Shortness of breath.  Chest pain for 2 weeks. EXAM: CHEST - 2 VIEW COMPARISON:  01/02/2023. FINDINGS: Bilateral lung fields are clear. Bilateral costophrenic angles are clear. Normal cardio-mediastinal silhouette. No acute osseous abnormalities. The soft tissues are within normal limits. Redemonstration of multiple surgical staples overlying the right axillary region and right breast region. IMPRESSION: No active cardiopulmonary disease. Electronically Signed   By: Jules Schick M.D.   On: 05/05/2023 13:04   DG Elbow 2 Views Right Result Date: 05/01/2023 CLINICAL DATA:  Right elbow pain EXAM: RIGHT ELBOW - 2 VIEW COMPARISON:  None Available. FINDINGS: No fractures There is mild degenerative changes involving the medial and lateral compartments with marginal calcifications adjacent to the medial humeral epicondyle. There is minimal soft tissue swelling involving the triceps insertion region as well as adjacent subcutaneous tissues of the olecranon. IMPRESSION: *Mild degenerative changes involving the medial and lateral compartments with marginal calcifications adjacent to the medial humeral epicondyle. *Minimal soft tissue swelling involving the triceps insertion region as well as adjacent subcutaneous tissues of the olecranon. Electronically Signed   By: Shaaron Adler M.D.   On: 05/01/2023 10:26   CT ABDOMEN PELVIS WO CONTRAST Result Date: 04/16/2023 CLINICAL DATA:  Acute abdominal pain EXAM: CT ABDOMEN AND PELVIS WITHOUT CONTRAST TECHNIQUE: Multidetector CT imaging of the abdomen and pelvis was performed following the  standard protocol without IV contrast. RADIATION DOSE REDUCTION: This exam was performed according to the departmental dose-optimization program which includes automated exposure control, adjustment of the mA and/or kV according to patient size and/or use of iterative reconstruction technique. COMPARISON:  09/22/2022 FINDINGS: Lower chest: No acute abnormality. Hepatobiliary: No focal liver abnormality is seen. Status post cholecystectomy. No biliary dilatation. Pancreas: Unremarkable.  No pancreatic ductal dilatation or surrounding inflammatory changes. Spleen: Normal in size without focal abnormality. Adrenals/Urinary Tract: Adrenal glands are within normal limits. Kidneys show bilateral renal cystic change stable from the prior exam. No further follow-up is recommended. No renal calculi or obstructive changes are seen. The bladder is partially distended. Stomach/Bowel: Scattered diverticular change of the colon is noted without evidence of diverticulitis. Postsurgical changes are noted in the ascending colon with ileocolonic anastomosis. The anastomosis is patent. No obstructive changes are seen. The stomach is within normal limits with the exception of a sliding-type hiatal hernia. Vascular/Lymphatic: Aortic atherosclerosis. No enlarged abdominal or pelvic lymph nodes. Reproductive: Status post hysterectomy. No adnexal masses. Other: No abdominal wall hernia or abnormality. No abdominopelvic ascites. Musculoskeletal: No acute or significant osseous findings. IMPRESSION: Postsurgical changes in the right colon without complicating factors. Small sliding-type hiatal hernia. Diverticulosis without diverticulitis. Electronically Signed   By: Alcide Clever M.D.   On: 04/16/2023 22:11

## 2023-05-12 NOTE — Assessment & Plan Note (Deleted)
-  Probably secondary to GI bleeding, also endoscopy was negative. -Her previous bone marrow biopsy in 2020 was negative. -She has been receiving IV iron as needed for ferritin less than 100. 

## 2023-05-12 NOTE — Evaluation (Signed)
 Physical Therapy Evaluation Patient Details Name: [name] MRN: [id number] DOB: 1945/05/21 Today's Date: 05/12/2023  History of Present Illness  Pt is a 78 y.o. female admitted 05/08/23 with diarrhea and fatigue. Was in ED 3 days prior with LLE pain and SOB.  Pt with acute renal failure. PMH:  CKD IV, HTN, CHF, CAD, DVT, CVA, DM2, gout, chronic gastritis, breast CA, memory loss, depression.  Clinical Impression  Pt admitted with above diagnosis. Pt from home with family, STM very limited so home info obtained from chart. Pt feeling well today. Ambulated 200' with RW with steady gait and supervision for safety. Will follow acutely but no recs for therapy after d/c.  Pt currently with functional limitations due to the deficits listed below (see PT Problem List). Pt will benefit from acute skilled PT to increase their independence and safety with mobility to allow discharge.           If plan is discharge home, recommend the following: Assistance with cooking/housework;Assist for transportation;Help with stairs or ramp for entrance   Can travel by private vehicle        Equipment Recommendations None recommended by PT  Recommendations for Other Services       Functional Status Assessment Patient has had a recent decline in their functional status and demonstrates the ability to make significant improvements in function in a reasonable and predictable amount of time.     Precautions / Restrictions Precautions Precautions: Fall Recall of Precautions/Restrictions: Impaired Precaution/Restrictions Comments: memory impairment/ decreased attention Restrictions Weight Bearing Restrictions Per Provider Order: No      Mobility  Bed Mobility               General bed mobility comments: received in chair    Transfers Overall transfer level: Needs assistance Equipment used: Rolling walker (2 wheels) Transfers: Sit to/from Stand Sit to Stand: Supervision            General transfer comment: supervision for safety    Ambulation/Gait Ambulation/Gait assistance: Supervision Gait Distance (Feet): 200 Feet Assistive device: Rolling walker (2 wheels) Gait Pattern/deviations: Step-through pattern Gait velocity: decreased Gait velocity interpretation: >2.62 ft/sec, indicative of community ambulatory   General Gait Details: stable with RW, supervision for safety  Stairs            Wheelchair Mobility     Tilt Bed    Modified Rankin (Stroke Patients Only)       Balance Overall balance assessment: Mild deficits observed, not formally tested                                           Pertinent Vitals/Pain Pain Assessment Pain Assessment: No/denies pain    Home Living Family/patient expects to be discharged to:: Private residence Living Arrangements: Other relatives (sister in law and niece) Available Help at Discharge: Family;Available PRN/intermittently Type of Home: House Home Access: Level entry     Alternate Level Stairs-Number of Steps: 12 Home Layout: Two level;1/2 bath on main level Home Equipment: Rolling Walker (2 wheels);Rollator (4 wheels);Cane - single point Additional Comments: pt poor historian, cannot accurately give PLOF or home set up, info obtained from chart from last admission    Prior Function Prior Level of Function : Independent/Modified Independent             Mobility Comments: independent, usually does not use AD, denies falls ADLs  Comments: daughter assists with med mgmt and driving per chart     Extremity/Trunk Assessment   Upper Extremity Assessment Upper Extremity Assessment: Generalized weakness    Lower Extremity Assessment Lower Extremity Assessment: Generalized weakness    Cervical / Trunk Assessment Cervical / Trunk Assessment: Normal  Communication   Communication Communication: No apparent difficulties    Cognition Arousal: Alert Behavior During Therapy:  WFL for tasks assessed/performed   PT - Cognitive impairments: History of cognitive impairments, Memory, Attention                       PT - Cognition Comments: significant STM loss Following commands: Intact       Cueing Cueing Techniques: Verbal cues     General Comments General comments (skin integrity, edema, etc.): HR 97 bpm, SPO2 97% on RA    Exercises     Assessment/Plan    PT Assessment Patient needs continued PT services  PT Problem List Decreased strength;Decreased activity tolerance;Decreased mobility;Decreased balance;Decreased cognition       PT Treatment Interventions DME instruction;Gait training;Stair training;Functional mobility training;Therapeutic activities;Therapeutic exercise;Balance training;Patient/family education    PT Goals (Current goals can be found in the Care Plan section)  Acute Rehab PT Goals Patient Stated Goal: return home PT Goal Formulation: With patient Time For Goal Achievement: 05/26/23 Potential to Achieve Goals: Good    Frequency Min 1X/week     Co-evaluation               AM-PAC PT "6 Clicks" Mobility  Outcome Measure Help needed turning from your back to your side while in a flat bed without using bedrails?: None Help needed moving from lying on your back to sitting on the side of a flat bed without using bedrails?: None Help needed moving to and from a bed to a chair (including a wheelchair)?: A Little Help needed standing up from a chair using your arms (e.g., wheelchair or bedside chair)?: A Little Help needed to walk in hospital room?: A Little Help needed climbing 3-5 steps with a railing? : A Little 6 Click Score: 20    End of Session Equipment Utilized During Treatment: Gait belt Activity Tolerance: Patient tolerated treatment well Patient left: in chair;with call bell/phone within reach Nurse Communication: Mobility status PT Visit Diagnosis: Muscle weakness (generalized) (M62.81);Difficulty in  walking, not elsewhere classified (R26.2)    Time: 1610-9604 PT Time Calculation (min) (ACUTE ONLY): 15 min   Charges:   PT Evaluation $PT Eval Moderate Complexity: 1 Mod   PT General Charges $$ ACUTE PT VISIT: 1 Visit         Lyanne Co, PT  Acute Rehab Services Secure chat preferred Office [phone]   Lawana Chambers Misaki Sozio 05/12/2023, 3:21 PM

## 2023-05-12 NOTE — Evaluation (Signed)
 Clinical/Bedside Swallow Evaluation Patient Details  Name: [name] MRN: [id number] Date of Birth: [birth year]  Today's Date: 05/12/2023 Time: SLP Start Time (ACUTE ONLY): 1811 SLP Stop Time (ACUTE ONLY): 1823 SLP Time Calculation (min) (ACUTE ONLY): 12 min  Past Medical History:  Past Medical History:  Diagnosis Date   Abdominal discomfort    Chronic N/V/D. Presumptive dx Crohn's dx per elevated p ANCA. Failed Entocort and Pentasa. Sep 2003 - ileocolectomy c anastomosis per Dr Orson Slick 2/2 adhesions - path was hegative for Crohns. EGD, Sm bowel follow through (11/03), and an eteroclysis (10/03) were unrevealing. Cuases hypomag and hypocalcemia.   Adnexal mass 8/03   s/p lap BSO (R ovarian fibroma) & lysis of adhesions   Allergy    Seasonal   Anemia    Multifactorial. Baseline HgB 10-11 ish. B12 def - 150 in 3/10. Fe Def - ferritin 35 3/10. Both are being repleted.   Breast cancer (HCC) 03/16/13   right, 5 o'clock   CAD (coronary artery disease) 1996   1996 - PTCA and angioplasty diagonal branch. 2000 - Rotoblator & angiopllasty of diagonal. 2006 - subendocardial AMI, DES to proximal LAD.Marland Kitchen Also had 90% stenosis in distal apical LAD. EF 55 with apical hypokinesis. Indefinite ASA and Plavix.   CHF (congestive heart failure) (HCC)    Chronic kidney disease    Chronic renal insuff baseline Cr 1.2 - 1.4 ish.   Chronic kidney disease, stage III (moderate) (HCC)    Chronic renal insuff baseline Cr 1.2 - 1.4 ish. New baseline about 2 (2018)   Chronic pain    CT 10/10 = Spinal stenosis L2 - S1.   COVID-19 11/02/2020   Diabetes mellitus    Insulin dependent   Diabetes mellitus type 2 in obese 05/03/2010   Managed on lantus and novolog. Has diabetic nephropathy. Metformin D/C'd 2012 2/2 creatinine.  No diabetic retinopathy per 3/11.    Gout    Hx of radiation therapy 06/02/13- 07/16/13   right rbeast 4500 cGy 25 sessions, right breast boost 1600 cGy in 8 sessions   Hyperlipidemia     Managed with both a statin and Welchol. Welchol stopped 2014 2/2 cost and started on fenofibrate    Hypertension    2006 B renal arteries patent. 2003 MRA - no RAS. 2003 pheo W/U Dr Caryn Section reportedly negative.   Hypoxia 07/23/2017   Lupus    Lymphedema of breast    Personal history of radiation therapy 2015   Pneumonia of left lung due to infectious organism    RBBB    Renal vein thrombosis (HCC)    SBO (small bowel obstruction) (HCC) 09/17/2017   Secondary hyperparathyroidism (HCC)    Stroke (HCC)    Incidental finding MRI 2002 L lacunar infarct   Vitamin B12 deficiency    Vitamin D deficiency    Wears dentures    top   Past Surgical History:  Past Surgical History:  Procedure Laterality Date   ABDOMINAL HYSTERECTOMY     BILATERAL SALPINGOOPHORECTOMY  8/03   Lap BSO (R ovarian fibroma) and adhesion lysis   BIOPSY  09/08/2020   Procedure: BIOPSY;  Surgeon: Bernette Redbird, MD;  Location: [facility];  Service: Endoscopy;;   BOWEL RESECTION  2003   ileocolectomy with anastomosis 2/2 adhesions   BREAST BIOPSY Right 2015   BREAST BIOPSY Right 07/2014   BREAST LUMPECTOMY Right 04/22/2013   BREAST LUMPECTOMY WITH NEEDLE LOCALIZATION AND AXILLARY SENTINEL LYMPH NODE BX Right 04/22/2013   Procedure: BREAST LUMPECTOMY  WITH NEEDLE LOCALIZATION AND AXILLARY SENTINEL LYMPH NODE BX;  Surgeon: Almond Lint, MD;  Location: [facility];  Service: General;  Laterality: Right;   CARDIAC CATHETERIZATION     2 stents   CHOLECYSTECTOMY     COLONOSCOPY     ESOPHAGOGASTRODUODENOSCOPY N/A 09/08/2020   Procedure: ESOPHAGOGASTRODUODENOSCOPY (EGD);  Surgeon: Bernette Redbird, MD;  Location: [facility];  Service: Endoscopy;  Laterality: N/A;   HEMICOLECTOMY     R sided hemicolectomy   HERNIA REPAIR     Ventral hernia repair   PTCA  4/06   HPI:  Pt is a 78 y.o. female admitted 05/08/23 with diarrhea and fatigue. Was in ED 3 days prior with LLE pain and SOB.  Pt with acute renal failure.  PMH:  CKD IV, HTN, CHF, CAD, DVT, CVA, DM2, gout, chronic gastritis, breast CA, memory loss, depression. Reports food gettng "stuck", no memory of GERD d/x    Assessment / Plan / Recommendation  Clinical Impression  Pt seen for skilled ST services for swallow evaluation. The pt is currently on a regular/thin liquid diet and was assessed with thin liquid, puree, and regular solids. OME was [facility]. The pt reports food getting stuck in her chest and coughing after a meal and no coughing while eating. Pt consumed all three consistencies with no s/sx of aspiration and no report of food getting stuck. Pt likely presenting with an esophageal concern suspected GERD as opposed to a pharyngeal dysphagia. Pt educated on diet modifications, compensatory strategies and options for medication following a conversation with an MD about her concerns. Pt verbally acknowledged understanding. Safest diet recs remain regular/thin liquid with general safe swallow precautions. No further acute ST needs at this time, SLP signing off. Please reconsult if ST needs change, thank you! SLP Visit Diagnosis: Dysphagia, unspecified (R13.10)    Aspiration Risk  No limitations    Diet Recommendation Regular;Thin liquid    Liquid Administration via: Straw;Cup Medication Administration: Whole meds with liquid Supervision: Patient able to self feed Compensations: Slow rate;Small sips/bites Postural Changes: Seated upright at 90 degrees;Remain upright for at least 30 minutes after po intake    Other  Recommendations Recommended Consults: Consider GI evaluation Oral Care Recommendations: Oral care BID    Recommendations for follow up therapy are one component of a multi-disciplinary discharge planning process, led by the attending physician.  Recommendations may be updated based on patient status, additional functional criteria and insurance authorization.  Follow up Recommendations No SLP follow up      Assistance Recommended at  Discharge    Functional Status Assessment Patient has had a recent decline in their functional status and demonstrates the ability to make significant improvements in function in a reasonable and predictable amount of time.  Frequency and Duration            Prognosis        Swallow Study   General Date of Onset: 05/05/23 HPI: Pt is a 78 y.o. female admitted 05/08/23 with diarrhea and fatigue. Was in ED 3 days prior with LLE pain and SOB.  Pt with acute renal failure. PMH:  CKD IV, HTN, CHF, CAD, DVT, CVA, DM2, gout, chronic gastritis, breast CA, memory loss, depression. Reports food gettng "stuck", no memory of GERD d/x Type of Study: Bedside Swallow Evaluation Diet Prior to this Study: Regular;Thin liquids (Level 0) Temperature Spikes Noted: No Respiratory Status: Room air History of Recent Intubation: No Behavior/Cognition: Alert;Cooperative;Pleasant mood Oral Cavity Assessment: Within Functional Limits Oral Care  Completed by SLP: No Oral Cavity - Dentition: Dentures, bottom;Dentures, top Vision: Functional for self-feeding Self-Feeding Abilities: Able to feed self Patient Positioning: Upright in chair Baseline Vocal Quality: Normal Volitional Cough: Strong Volitional Swallow: Able to elicit    Oral/Motor/Sensory Function Overall Oral Motor/Sensory Function: Within functional limits   Ice Chips Ice chips: Not tested   Thin Liquid Thin Liquid: Within functional limits    Nectar Thick Nectar Thick Liquid: Not tested   Honey Thick Honey Thick Liquid: Not tested   Puree Puree: Within functional limits   Solid     Solid: Within functional limits      Dione Housekeeper M.S. CCC-SLP

## 2023-05-13 ENCOUNTER — Inpatient Hospital Stay: Payer: Medicare HMO | Admitting: Nurse Practitioner

## 2023-05-13 ENCOUNTER — Inpatient Hospital Stay: Payer: Medicare HMO

## 2023-05-13 DIAGNOSIS — N184 Chronic kidney disease, stage 4 (severe): Secondary | ICD-10-CM | POA: Diagnosis not present

## 2023-05-13 DIAGNOSIS — D518 Other vitamin B12 deficiency anemias: Secondary | ICD-10-CM

## 2023-05-13 DIAGNOSIS — D500 Iron deficiency anemia secondary to blood loss (chronic): Secondary | ICD-10-CM

## 2023-05-13 DIAGNOSIS — Z170 Estrogen receptor positive status [ER+]: Secondary | ICD-10-CM

## 2023-05-13 DIAGNOSIS — N179 Acute kidney failure, unspecified: Secondary | ICD-10-CM | POA: Diagnosis not present

## 2023-05-13 LAB — FOLATE: Folate: 5.3 ng/mL — ABNORMAL LOW (ref >5.9)

## 2023-05-13 LAB — MAGNESIUM: Magnesium: 1.5 mg/dL — ABNORMAL LOW (ref 1.7–2.4)

## 2023-05-13 LAB — FERRITIN: Ferritin: 220 ng/mL (ref 11–307)

## 2023-05-13 LAB — BASIC METABOLIC PANEL
Anion gap: 12 (ref 5–15)
BUN: 79 mg/dL — ABNORMAL HIGH (ref 8–23)
CO2: 26 mmol/L (ref 22–32)
Calcium: 8.9 mg/dL (ref 8.9–10.3)
Chloride: 103 mmol/L (ref 98–111)
Creatinine, Ser: 3.75 mg/dL — ABNORMAL HIGH (ref 0.44–1.00)
GFR, Estimated: 12 mL/min — ABNORMAL LOW (ref >60)
Glucose, Bld: 120 mg/dL — ABNORMAL HIGH (ref 70–99)
Potassium: 4 mmol/L (ref 3.5–5.1)
Sodium: 141 mmol/L (ref 135–145)

## 2023-05-13 LAB — IRON AND TIBC
Iron: 46 ug/dL (ref 28–170)
Saturation Ratios: 22 % (ref 10.4–31.8)
TIBC: 206 ug/dL — ABNORMAL LOW (ref 250–450)
UIBC: 160 ug/dL

## 2023-05-13 LAB — CBC
HCT: 27.2 % — ABNORMAL LOW (ref 36.0–46.0)
Hemoglobin: 8.6 g/dL — ABNORMAL LOW (ref 12.0–15.0)
MCH: 27.9 pg (ref 26.0–34.0)
MCHC: 31.6 g/dL (ref 30.0–36.0)
MCV: 88.3 fL (ref 80.0–100.0)
Platelets: 303 10*3/uL (ref 150–400)
RBC: 3.08 MIL/uL — ABNORMAL LOW (ref 3.87–5.11)
RDW: 19.5 % — ABNORMAL HIGH (ref 11.5–15.5)
WBC: 12.3 10*3/uL — ABNORMAL HIGH (ref 4.0–10.5)
nRBC: 0 % (ref 0.0–0.2)

## 2023-05-13 LAB — VITAMIN B12: Vitamin B-12: 1040 pg/mL — ABNORMAL HIGH (ref 180–914)

## 2023-05-13 LAB — RETICULOCYTES
Immature Retic Fract: 15.2 % (ref 2.3–15.9)
RBC.: 3.17 MIL/uL — ABNORMAL LOW (ref 3.87–5.11)
Retic Count, Absolute: 48.2 10*3/uL (ref 19.0–186.0)
Retic Ct Pct: 1.5 % (ref 0.4–3.1)

## 2023-05-13 LAB — GLUCOSE, CAPILLARY
Glucose-Capillary: 178 mg/dL — ABNORMAL HIGH (ref 70–99)
Glucose-Capillary: 150 mg/dL — ABNORMAL HIGH (ref 70–99)
Glucose-Capillary: 137 mg/dL — ABNORMAL HIGH (ref 70–99)
Glucose-Capillary: 220 mg/dL — ABNORMAL HIGH (ref 70–99)
Glucose-Capillary: 145 mg/dL — ABNORMAL HIGH (ref 70–99)

## 2023-05-13 MED ORDER — FUROSEMIDE 40 MG PO TABS
40.0000 mg | ORAL_TABLET | Freq: Every day | ORAL | Status: DC
  Administered 2023-05-13 – 2023-05-14 (×2): 40 mg via ORAL
  Filled 2023-05-13 (×2): qty 1

## 2023-05-13 MED ORDER — FUROSEMIDE 10 MG/ML IJ SOLN
40.0000 mg | Freq: Once | INTRAMUSCULAR | Status: AC
  Administered 2023-05-13: 40 mg via INTRAVENOUS
  Filled 2023-05-13: qty 4

## 2023-05-13 MED ORDER — ORAL CARE MOUTH RINSE: 15.0000 mL | OROMUCOSAL | Status: DC | PRN

## 2023-05-13 MED ORDER — FLUOXETINE HCL 20 MG PO CAPS
20.0000 mg | ORAL_CAPSULE | Freq: Every day | ORAL | Status: DC
  Administered 2023-05-13 – 2023-05-14 (×2): 20 mg via ORAL
  Filled 2023-05-13 (×2): qty 1

## 2023-05-13 MED ORDER — RIVASTIGMINE TARTRATE 1.5 MG PO CAPS
1.5000 mg | ORAL_CAPSULE | Freq: Two times a day (BID) | ORAL | Status: DC
  Administered 2023-05-13 – 2023-05-14 (×3): 1.5 mg via ORAL
  Filled 2023-05-13 (×4): qty 1

## 2023-05-13 NOTE — Progress Notes (Signed)
 Mobility Specialist Progress Note:    05/13/23 0900  Mobility  Activity Ambulated with assistance in hallway  Level of Assistance Standby assist, set-up cues, supervision of patient - no hands on  Assistive Device Front wheel walker (Simultaneous filing. User may not have seen previous data.)  Distance Ambulated (ft) 200 ft  Activity Response Tolerated well  Mobility Referral Yes  Mobility visit 1 Mobility  Mobility Specialist Start Time (ACUTE ONLY) [id number]  Mobility Specialist Stop Time (ACUTE ONLY) 0851  Mobility Specialist Time Calculation (min) (ACUTE ONLY) 8 min   Received pt in chair having no complaints and agreeable to mobility. Pt was asymptomatic throughout ambulation and returned to room w/o fault. Left in chair w/ call bell in reach and all needs met.   D'Vante Earlene Plater Mobility Specialist Please contact via Special educational needs teacher or Rehab office at [phone]

## 2023-05-13 NOTE — TOC CM/SW Note (Signed)
 Transition of Care [facility]) - Inpatient Brief Assessment   Patient Details  Name: [name] MRN: [id number] Date of Birth: 1945/10/07  Transition of Care [facility]) CM/SW Contact:    Tom-Johnson, Hershal Coria, RN Phone Number: 05/13/2023, 4:50 PM   Clinical Narrative:  Patient presented to the ED with persistent Diarrhea and progressive Fatigue. Admitted with Acute Renal Failure on CKD 4. Has hx of Hypertension, LE DVT, Memory Loss, CAD, CKD stage IV, Chronic HFpEF and Depression. Patient on Eliquis, Colestid, Lomotil and Creon. GI consulted, signed off.  From home with daughter, has four supportive children. Family transport to and from appointments. Has necessary DME's at home. PT recommended RW, patient has one at home.  PCP is Renford Dills, MD and uses [facility] on [address].   Home health recommended, patient and daughter, [name] has no preference. CM called in referral to [facility] and [facility] voiced acceptance. SOC on 05/16/23, info on AVS.   Patient not Medically ready for discharge.  CM will continue to follow as patient progresses with care towards discharge.               Transition of Care Asessment: Insurance and Status: Insurance coverage has been reviewed Patient has primary care physician: Yes Home environment has been reviewed: Yes Prior level of function:: Modified Independent Prior/Current Home Services: No current home services Social Drivers of Health Review: SDOH reviewed no interventions necessary Readmission risk has been reviewed: Yes Transition of care needs: transition of care needs identified, TOC will continue to follow

## 2023-05-13 NOTE — Progress Notes (Signed)
 Progress Note   Patient: [name] UVO:[id number] DOB: 1946-01-12 DOA: 05/08/2023     5 DOS: the patient was seen and examined on 05/13/2023   Brief hospital course:  78 y.o. 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   Assessment and Plan:  Acute kidney injury on CKD 4 - Baseline creatinine as low was 2.56 months ago.  Nearly 6 on presentation.  Likely prerenal etiology.  No longer requiring bicarb drip.  Patient tolerating p.o.  Continue to monitor urine output and recheck panel in AM.  Continues to show improvement.  Will recheck BMP and mag in AM.  Mixed acid-base disorder - Likely secondary to chronic diarrhea and superimposed metabolic acidosis from CKD.  Acidemia appears resolved, discontinued bicarb drip.  Patient tolerating p.o.  Will recheck BMP in AM.  Hypokalemia with severe hypomagnesemia - Likely secondary to GI loss.  Received aggressive IV magnesium supplementation.  Potassium supplementation on board as well.  Appears to show marked improvement this morning.  Will discontinue telemetry for now.  Intractable acute on chronic diarrhea - Patient stated she has had loose stools for upwards of 2 weeks.  C. difficile and GI pathogen profile negative.  GI consulted and following closely.  Patient with known history of chronic diarrhea since 2002 after right hemicolectomy and ileotransverse colonic anastomosis, status post multiple EGDs/colonoscopies.  Continues on Lomotil twice daily.  Per GI recommendations, restarting Creon and colestipol.  Hold off on p.o. magnesium as to not exacerbate diarrhea.  Appears to be showing improvement.  Diarrhea appears to be resolved.  GI signing off.  Acute on chronic normocytic anemia - Hemoglobin showing downtrend for the last 2 days.  No  active bleeding appreciated.  BRBPR on presentation but nothing since.  No dark stools, hematemesis, melena.  Family concerned about possible insidious GI bleed.  Will order iron studies, stool occult.  Will recheck hemoglobin in AM.  If stable, unlikely ongoing GI bleed.  If hemoglobin continues to drop, will reach back out to GI.  History of lower extremity DVT and renal artery thrombosis - Continue Eliquis  Chronic HFpEF - Does not appear to be in exacerbation.    Diabetes mellitus - Insulin sliding scale on board.  Depression/memory loss - Remeron on board.  Dysphagia - Reported by family.  Apparently patient admits to having food feels stuck when she tries to swallow.  Swallow evaluation performed with no dysfunction appreciated.  Physical debilitation and muscle weakness - Exacerbated by severe electrolyte depletion.  Evaluated by physical therapy.  Will transition home with home health when appropriate for discharge.     Subjective: Patient laying in bed comfortably this morning.  States she minimal bowel movements can, only 1.  Diarrhea appears resolved.  No other symptoms such as fever, shortness of breath, chest pain, nausea, vomiting, abdominal pain.  Feeling improved.    Talk to the patient's daughter over the phone, patient's daughter concerned about the patient's dropping hemoglobin.  No active bleeding that she is appreciated nor dark stools.  However admits that the patient herself has memory issues.  Physical Exam: Vitals:   05/12/23 1641 05/12/23 2035 05/13/23 0553 05/13/23 0812  BP: 138/65 (!) 170/62 (!) 169/68 (!) 182/80  Pulse: 87 91 74 90  Resp:  17 17 18   Temp: 98.8 F (37.1 C) 98.9 F (37.2 C)  98.7 F (37.1 C)   TempSrc: Oral Oral Oral   SpO2: 100% 99% 97% 96%  Weight:   71 kg   Height:   5\' 4"  (1.626 m)    GENERAL:  Alert, pleasant, no acute distress  HEENT:  EOMI CARDIOVASCULAR:  RRR, no murmurs appreciated RESPIRATORY:  Clear to auscultation, no  wheezing, rales, or rhonchi GASTROINTESTINAL:  Soft, nontender, nondistended EXTREMITIES:  No LE edema bilaterally NEURO:  No new focal deficits appreciated SKIN:  No rashes noted PSYCH:  Appropriate mood and affect   Data Reviewed:  There are no new results to review at this time.  Family Communication: None at bedside  Disposition: Status is: Inpatient Remains inpatient appropriate because: Severe hypokalemia/hypomagnesemia and AKI  Planned Discharge Destination:  To be determined by PT for electrolyte replenishment    Time spent: 36 minutes  Author: Deanna Artis, DO 05/13/2023 2:00 PM  For on call review [email or web address].

## 2023-05-13 NOTE — Care Management Important Message (Signed)
 Important Message  Patient Details  Name: [name] MRN: [id number] Date of Birth: [birth year]   Important Message Given:  Yes - Medicare IM     Dorena Bodo 05/13/2023, 3:23 PM

## 2023-05-13 NOTE — Progress Notes (Signed)
 Physical Therapy Treatment Patient Details Name: [name] MRN: [id number] DOB: 29-May-1945 Today's Date: 05/13/2023   History of Present Illness Pt is a 78 y.o. female admitted 05/08/23 with diarrhea and fatigue. Was in ED 3 days prior with LLE pain and SOB.  Pt with acute renal failure. PMH:  CKD IV, HTN, CHF, CAD, DVT, CVA, DM2, gout, chronic gastritis, breast CA, memory loss, depression.    PT Comments  Pt seen for PT tx with pt agreeable to tx, granddaughter Shanda Bumps) present in room with daughter Berneda Rose) on [place] throughout session. Per family, pt with diagnosis of dementia, living with family in a 2 level home with full bath on 2nd level with flight of stairs with R rail to access, 2 steps without rails to enter home. Pt was furniture walking in the home, family notes 1 fall in October. On this date, PT educates pt on use of RW vs furniture walking to prevent falls. Pt is able to complete STS with BUE support with supervision, ambulate around unit with AD with supervision without LOB. Pt negotiates flight of stairs with R rail & CGA. Pt would benefit from ongoing acute PT services to address balance, strengthening, safety awareness.    If plan is discharge home, recommend the following: Supervision due to cognitive status;Direct supervision/assist for financial management;Assistance with cooking/housework;Help with stairs or ramp for entrance;Direct supervision/assist for medications management;Assist for transportation   Can travel by private vehicle        Equipment Recommendations  Rolling walker (2 wheels)    Recommendations for Other Services       Precautions / Restrictions Precautions Precautions: Fall Restrictions Weight Bearing Restrictions Per Provider Order: No     Mobility  Bed Mobility               General bed mobility comments: not tested, pt received & left sitting in recliner    Transfers Overall transfer level: Needs  assistance Equipment used: Rolling walker (2 wheels) Transfers: Sit to/from Stand Sit to Stand: Supervision           General transfer comment: STS from recliner with BUE to push to standing.    Ambulation/Gait Ambulation/Gait assistance: Supervision Gait Distance (Feet):  (>150 ft) Assistive device: Rolling walker (2 wheels) Gait Pattern/deviations: Step-through pattern Gait velocity: slightly decreased     General Gait Details: no LOB   Stairs Stairs: Yes Stairs assistance: Contact guard assist Stair Management: One rail Right, Step to pattern Number of Stairs: 11 (6") General stair comments: Pt negotiates flight of stairs with R rail & with CGA, pt leaning on wall with L shoulder when descending.   Wheelchair Mobility     Tilt Bed    Modified Rankin (Stroke Patients Only)       Balance Overall balance assessment: Needs assistance   Sitting balance-Leahy Scale: Good     Standing balance support: During functional activity, Reliant on assistive device for balance Standing balance-Leahy Scale: Fair                              Hotel manager: No apparent difficulties  Cognition Arousal: Alert Behavior During Therapy: WFL for tasks assessed/performed   PT - Cognitive impairments: History of cognitive impairments, Memory, Attention                       PT - Cognition Comments: pt with hx of dementia per daughter, follows  simple commands throughout session with extra time PRN. Following commands: Intact      Cueing Cueing Techniques: Verbal cues  Exercises Other Exercises Other Exercises: Pt performs 5x STS from recliner without BUE support with min assist with focus on BLE strengthening & endurance training.    General Comments General comments (skin integrity, edema, etc.): pt denies falls in the past 6 months, daughter reports 1 fall in October, pt furniture walks at baseline; lives in 2 level home  with full bathroom on 2nd level, bedroom on first level (flight of stairs with R rail to access upstairs, 2 steps without rails to enter home)      Pertinent Vitals/Pain Pain Assessment Pain Assessment: No/denies pain    Home Living                          Prior Function            PT Goals (current goals can now be found in the care plan section) Acute Rehab PT Goals Patient Stated Goal: return home PT Goal Formulation: With patient Time For Goal Achievement: 05/26/23 Potential to Achieve Goals: Good Progress towards PT goals: Progressing toward goals    Frequency    Min 1X/week      PT Plan      Co-evaluation              AM-PAC PT "6 Clicks" Mobility   Outcome Measure  Help needed turning from your back to your side while in a flat bed without using bedrails?: None Help needed moving from lying on your back to sitting on the side of a flat bed without using bedrails?: None Help needed moving to and from a bed to a chair (including a wheelchair)?: A Little Help needed standing up from a chair using your arms (e.g., wheelchair or bedside chair)?: A Little Help needed to walk in hospital room?: A Little Help needed climbing 3-5 steps with a railing? : A Little 6 Click Score: 20    End of Session   Activity Tolerance: Patient tolerated treatment well Patient left: in chair;with call bell/phone within reach;with chair alarm set;with family/visitor present Nurse Communication: Mobility status PT Visit Diagnosis: Muscle weakness (generalized) (M62.81);Difficulty in walking, not elsewhere classified (R26.2)     Time: 0981-1914 PT Time Calculation (min) (ACUTE ONLY): 23 min  Charges:    $Therapeutic Activity: 23-37 mins PT General Charges $$ ACUTE PT VISIT: 1 Visit                     Aleda Grana, PT, DPT 05/13/23, 12:04 PM   Sandi Mariscal 05/13/2023, 12:02 PM

## 2023-05-14 DIAGNOSIS — N179 Acute kidney failure, unspecified: Secondary | ICD-10-CM | POA: Diagnosis not present

## 2023-05-14 DIAGNOSIS — E876 Hypokalemia: Secondary | ICD-10-CM | POA: Diagnosis not present

## 2023-05-14 LAB — CBC
HCT: 29.8 % — ABNORMAL LOW (ref 36.0–46.0)
Hemoglobin: 9.1 g/dL — ABNORMAL LOW (ref 12.0–15.0)
MCH: 27.2 pg (ref 26.0–34.0)
MCHC: 30.5 g/dL (ref 30.0–36.0)
MCV: 89.2 fL (ref 80.0–100.0)
Platelets: 300 10*3/uL (ref 150–400)
RBC: 3.34 MIL/uL — ABNORMAL LOW (ref 3.87–5.11)
RDW: 19.3 % — ABNORMAL HIGH (ref 11.5–15.5)
WBC: 12.8 10*3/uL — ABNORMAL HIGH (ref 4.0–10.5)
nRBC: 0 % (ref 0.0–0.2)

## 2023-05-14 LAB — COMPREHENSIVE METABOLIC PANEL
ALT: 9 U/L (ref 0–44)
AST: 12 U/L — ABNORMAL LOW (ref 15–41)
Albumin: 2.3 g/dL — ABNORMAL LOW (ref 3.5–5.0)
Alkaline Phosphatase: 55 U/L (ref 38–126)
Anion gap: 9 (ref 5–15)
BUN: 75 mg/dL — ABNORMAL HIGH (ref 8–23)
CO2: 25 mmol/L (ref 22–32)
Calcium: 8.7 mg/dL — ABNORMAL LOW (ref 8.9–10.3)
Chloride: 105 mmol/L (ref 98–111)
Creatinine, Ser: 3.95 mg/dL — ABNORMAL HIGH (ref 0.44–1.00)
GFR, Estimated: 11 mL/min — ABNORMAL LOW (ref >60)
Glucose, Bld: 151 mg/dL — ABNORMAL HIGH (ref 70–99)
Potassium: 4.4 mmol/L (ref 3.5–5.1)
Sodium: 139 mmol/L (ref 135–145)
Total Bilirubin: 0.4 mg/dL (ref 0.0–1.2)
Total Protein: 6.3 g/dL — ABNORMAL LOW (ref 6.5–8.1)

## 2023-05-14 LAB — GLUCOSE, CAPILLARY
Glucose-Capillary: 115 mg/dL — ABNORMAL HIGH (ref 70–99)
Glucose-Capillary: 165 mg/dL — ABNORMAL HIGH (ref 70–99)

## 2023-05-14 LAB — MAGNESIUM: Magnesium: 1.3 mg/dL — ABNORMAL LOW (ref 1.7–2.4)

## 2023-05-14 MED ORDER — PANCRELIPASE (LIP-PROT-AMYL) 36000-114000 UNITS PO CPEP: 36000.0000 [IU] | ORAL_CAPSULE | Freq: Three times a day (TID) | ORAL | 2 refills | Status: DC

## 2023-05-14 MED ORDER — LOPERAMIDE HCL 2 MG PO CAPS: 2.0000 mg | ORAL_CAPSULE | ORAL | 0 refills | Status: DC | PRN

## 2023-05-14 MED ORDER — COLESTIPOL HCL 1 G PO TABS: 1.0000 g | ORAL_TABLET | Freq: Three times a day (TID) | ORAL | 2 refills | Status: AC

## 2023-05-14 MED ORDER — FOLIC ACID 1 MG PO TABS: 1.0000 mg | ORAL_TABLET | Freq: Every day | ORAL | 2 refills | Status: DC

## 2023-05-14 MED ORDER — DIPHENOXYLATE-ATROPINE 2.5-0.025 MG PO TABS: 1.0000 | ORAL_TABLET | Freq: Two times a day (BID) | ORAL | 1 refills | Status: DC | PRN

## 2023-05-14 MED ORDER — MAGNESIUM SULFATE 4 GM/100ML IV SOLN
4.0000 g | Freq: Once | INTRAVENOUS | Status: AC
  Administered 2023-05-14: 4 g via INTRAVENOUS
  Filled 2023-05-14: qty 100

## 2023-05-14 NOTE — Plan of Care (Signed)
  Problem: Education: Goal: Individualized Educational Video(s) Outcome: Adequate for Discharge   Problem: Fluid Volume: Goal: Ability to maintain a balanced intake and output will improve Outcome: Adequate for Discharge   Problem: Health Behavior/Discharge Planning: Goal: Ability to identify and utilize available resources and services will improve Outcome: Adequate for Discharge Goal: Ability to manage health-related needs will improve Outcome: Adequate for Discharge   Problem: Metabolic: Goal: Ability to maintain appropriate glucose levels will improve Outcome: Adequate for Discharge   Problem: Nutritional: Goal: Maintenance of adequate nutrition will improve Outcome: Adequate for Discharge Goal: Progress toward achieving an optimal weight will improve Outcome: Adequate for Discharge   Problem: Skin Integrity: Goal: Risk for impaired skin integrity will decrease Outcome: Adequate for Discharge   Problem: Tissue Perfusion: Goal: Adequacy of tissue perfusion will improve Outcome: Adequate for Discharge   Problem: Education: Goal: Knowledge of General Education information will improve Description: Including pain rating scale, medication(s)/side effects and non-pharmacologic comfort measures Outcome: Adequate for Discharge   Problem: Health Behavior/Discharge Planning: Goal: Ability to manage health-related needs will improve Outcome: Adequate for Discharge   Problem: Clinical Measurements: Goal: Ability to maintain clinical measurements within normal limits will improve Outcome: Adequate for Discharge Goal: Will remain free from infection Outcome: Adequate for Discharge Goal: Diagnostic test results will improve Outcome: Adequate for Discharge Goal: Respiratory complications will improve Outcome: Adequate for Discharge Goal: Cardiovascular complication will be avoided Outcome: Adequate for Discharge   Problem: Activity: Goal: Risk for activity intolerance will  decrease Outcome: Adequate for Discharge   Problem: Nutrition: Goal: Adequate nutrition will be maintained Outcome: Adequate for Discharge   Problem: Coping: Goal: Level of anxiety will decrease Outcome: Adequate for Discharge   Problem: Elimination: Goal: Will not experience complications related to bowel motility Outcome: Adequate for Discharge Goal: Will not experience complications related to urinary retention Outcome: Adequate for Discharge   Problem: Pain Managment: Goal: General experience of comfort will improve and/or be controlled Outcome: Adequate for Discharge   Problem: Safety: Goal: Ability to remain free from injury will improve Outcome: Adequate for Discharge   Problem: Skin Integrity: Goal: Risk for impaired skin integrity will decrease Outcome: Adequate for Discharge

## 2023-05-14 NOTE — TOC Transition Note (Signed)
 Transition of Care [facility]) - Discharge Note   Patient Details  Name: [name] MRN: [id number] Date of Birth: Dec 28, 1945  [facility] CM/SW Contact:  Tom-Johnson, Hershal Coria, RN Phone Number: 05/14/2023, 1:12 PM   Clinical Narrative:     Patient is scheduled for discharge today.  Readmission Risk Assessment done. Home health info, hospital f/u and discharge instructions on AVS. Family to transport at discharge.  No further TOC needs noted.          Final next level of care: Home w Home Health Services Barriers to Discharge: Barriers Resolved   Patient Goals and CMS Choice Patient states their goals for this hospitalization and ongoing recovery are:: To return home CMS Medicare.gov Compare Post Acute Care list provided to:: Patient Choice offered to / list presented to : Patient, Adult Children      Discharge Placement                Patient to be transferred to facility by: Family      Discharge Plan and Services Additional resources added to the After Visit Summary for                  DME Arranged: N/A DME Agency: NA       HH Arranged: PT, OT, Nurse's Aide, Social Work [employer] Agency: [facility] Date HH Agency Contacted: 05/13/23 Time HH Agency Contacted: 1630 Representative spoke with at [facility] Agency: [facility]  Social Drivers of Health (SDOH) Interventions SDOH Screenings   Food Insecurity: No Food Insecurity (05/09/2023)  Housing: Low Risk  (05/09/2023)  Transportation Needs: No Transportation Needs (05/09/2023)  Utilities: Not At Risk (05/09/2023)  Alcohol Screen: Low Risk  (10/25/2021)  Depression (PHQ2-9): Low Risk  (09/12/2020)  Financial Resource Strain: Low Risk  (10/25/2021)  Social Connections: Socially Isolated (05/09/2023)  Tobacco Use: Low Risk  (05/08/2023)     Readmission Risk Interventions    05/13/2023    4:50 PM 12/27/2021    2:10 PM 11/08/2021   12:56 PM  Readmission Risk Prevention Plan   Transportation Screening Complete Complete Complete  Medication Review (RN Care Manager) Referral to Pharmacy Complete Complete  PCP or Specialist appointment within 3-5 days of discharge Complete Complete Complete  HRI or Home Care Consult Complete Complete   SW Recovery Care/Counseling Consult Complete Complete Complete  Palliative Care Screening Not Applicable  Not Applicable  Skilled Nursing Facility Not Applicable Not Applicable Not Applicable

## 2023-05-14 NOTE — Progress Notes (Signed)
 DISCHARGE NOTE HOME [name] to be discharged Home per MD order. Discussed prescriptions and follow up appointments with the patient. Prescriptions given to patient; medication list explained in detail. Patient verbalized understanding.  Skin clean, dry and intact without evidence of skin break down, no evidence of skin tears noted. IV catheter discontinued intact. Site without signs and symptoms of complications. Dressing and pressure applied. Pt denies pain at the site currently. No complaints noted.  Patient free of lines, drains, and wounds.   An After Visit Summary (AVS) was printed and given to the patient. Patient escorted via wheelchair, and discharged home via private auto.  Irwin Brakeman, RN

## 2023-05-14 NOTE — Progress Notes (Signed)
 Mobility Specialist Progress Note:    05/14/23 0932  Therapy Vitals  Temp 98.5 F (36.9 C)  Temp Source Oral  Pulse Rate 87  Resp 18  BP (!) 158/76  Patient Position (if appropriate) Lying  Oxygen Therapy  SpO2 98 %  O2 Device Room Air  Mobility  Activity Ambulated with assistance in hallway  Level of Assistance Standby assist, set-up cues, supervision of patient - no hands on  Assistive Device Front wheel walker  Distance Ambulated (ft) 200 ft  Activity Response Tolerated well  Mobility Referral Yes  Mobility visit 1 Mobility  Mobility Specialist Start Time (ACUTE ONLY) [id number]  Mobility Specialist Stop Time (ACUTE ONLY) [id number]  Mobility Specialist Time Calculation (min) (ACUTE ONLY) 5 min   Received pt in chair having no complaints and agreeable to mobility. Pt was asymptomatic throughout ambulation and returned to room w/o fault. Left in chair w/ call bell in reach and all needs met. Chair alarm on.   [place]ante Earlene Plater Mobility Specialist Please contact via Special educational needs teacher or Rehab office at [phone]

## 2023-05-14 NOTE — Discharge Summary (Signed)
 Physician Discharge Summary   Patient: [name] MRN: [id number] DOB: 27-Jan-1946  Admit date:     05/08/2023  Discharge date: 05/14/23  Discharge Physician: Deanna Artis   PCP: Renford Dills, MD   Recommendations at discharge:   At this time patient will be discharged home.  If you experience any symptoms such as fever, vomiting, shortness of breath, chest pain, abdominal pain, or other concerning symptoms, please call your primary care provider or go to the emergency department immediately.  Discharge Diagnoses: Principal Problem:   Acute renal failure superimposed on stage 4 chronic kidney disease (HCC) Active Problems:   Chronic heart failure with preserved ejection fraction (HFpEF) (HCC)   History of DVT (deep vein thrombosis)   History of stroke without residual deficits   Hypokalemia due to excessive gastrointestinal loss of potassium   Benign essential HTN   CAD (coronary artery disease)   Type 2 diabetes mellitus (HCC)   Dehydration  Resolved Problems:   * No resolved hospital problems. *  Hospital Course: 78 y.o. 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   Assessment and Plan: Acute kidney injury on CKD 4 - Baseline creatinine as low was 2.56 months ago.  Nearly 6 on presentation.  Likely prerenal etiology.  No longer requiring bicarb drip.  Patient tolerating p.o.  Continues to show improvement.  Would recommend continued follow-up in the outpatient setting.  Mixed acid-base disorder - Likely secondary to chronic diarrhea and superimposed metabolic acidosis from CKD.  Acidemia appears resolved, discontinued bicarb drip.  Resolved  Hypokalemia with severe hypomagnesemia - Likely secondary to GI loss.  Received aggressive IV magnesium supplementation.   Potassium supplementation on board as well.  Appears to show marked improvement this morning.  He was resolved   Intractable acute on chronic diarrhea - Patient stated she has had loose stools for upwards of 2 weeks.  C. difficile and GI pathogen profile negative.  GI consulted and following closely.  Patient with known history of chronic diarrhea since 2002 after right hemicolectomy and ileotransverse colonic anastomosis, status post multiple EGDs/colonoscopies.  Showed marked improvement on scheduled Lomotil twice daily.  Per GI recommendations, restarting Creon and colestipol.  Hold off on p.o. magnesium as to not exacerbate diarrhea.  Diarrhea appears to be resolved.  GI signing off.  Upon discharge will provide prescription for Lomotil twice daily as needed as well as prescriptions for Creon and colestipol to take as directed.  Recommend continued follow-up with GI in the outpatient setting.   Acute on chronic normocytic anemia - Hemoglobin showing downtrend for the last 2 days.  No active bleeding appreciated.  BRBPR on presentation but nothing since.  No dark stools, hematemesis, melena.  Family concerned about possible insidious GI bleed.  Iron studies show sufficient iron stores however folate low.  Reticulocyte count inappropriately normal.  Hemoglobin trending upwards last day.  No active bleeding appreciated.  Will give prescription for p.o. folate.   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     Diabetes mellitus - A1c 6.2 suggesting excellent control.  Resume previous regimen.    Depression/memory loss - Resume previous home regiment   Dysphagia - Reported by family.  Apparently patient admits to having food feels stuck when  she tries to swallow.  Swallow evaluation performed with no dysfunction appreciated.   Physical debilitation and muscle weakness - Exacerbated by severe electrolyte  depletion.  Evaluated by physical therapy.  Will transition home with home health upon discharge.         Pain control - [employer] Controlled Substance Reporting System database was reviewed. and patient was instructed, not to drive, operate heavy machinery, perform activities at heights, swimming or participation in water activities or provide baby-sitting services while on Pain, Sleep and Anxiety Medications; until their outpatient Physician has advised to do so again. Also recommended to not to take more than prescribed Pain, Sleep and Anxiety Medications.  Consultants: Gastroenterology Procedures performed: None Disposition: Home health Diet recommendation:  Discharge Diet Orders (From admission, onward)     Start     Ordered   05/14/23 0000  Diet - low sodium heart healthy        05/14/23 1053           Cardiac and Carb modified diet DISCHARGE MEDICATION: Allergies as of 05/14/2023       Reactions   Anesthetics, Halogenated Other (See Comments)   Per patient's daughter, she can not tolerate anesthetics.    Etomidate Other (See Comments)   Heart stopped during anesthesia   Fentanyl Other (See Comments)   Heart stopped during anesthesia   Percocet [oxycodone-acetaminophen] Other (See Comments)   Headaches   Diazepam Other (See Comments)   Agitation   Lorazepam Nausea Only   Tramadol Hcl Nausea And Vomiting   Haloperidol Nausea And Vomiting   **Haldol**   Morphine Sulfate Nausea Only   agitation   Propoxyphene Nausea And Vomiting   **Darvocet** no longer available   Versed [midazolam] Other (See Comments)   Heart stopped during anesthesia        Medication List     STOP taking these medications    hydrALAZINE 50 MG tablet Commonly known as: APRESOLINE   predniSONE 10 MG tablet Commonly known as: DELTASONE       TAKE these medications    acetaminophen 500 MG tablet Commonly known as: TYLENOL Take 1 tablet (500 mg total) by mouth every 6 (six)  hours as needed. What changed:  how much to take reasons to take this   amLODipine 10 MG tablet Commonly known as: NORVASC Take 1 tablet (10 mg total) by mouth daily.   colestipol 1 g tablet Commonly known as: COLESTID Take 1 tablet (1 g total) by mouth 3 (three) times daily.   diphenoxylate-atropine 2.5-0.025 MG tablet Commonly known as: Lomotil Take 1 tablet by mouth 2 (two) times daily as needed for diarrhea or loose stools.   Eliquis 2.5 MG Tabs tablet Generic drug: apixaban Take 1 tablet (2.5 mg total) by mouth 2 (two) times daily.   famotidine 20 MG tablet Commonly known as: PEPCID Take 20 mg by mouth daily as needed for heartburn or indigestion.   FLUoxetine 20 MG capsule Commonly known as: PROZAC Take 20 mg by mouth daily.   folic acid 1 MG tablet Commonly known as: FOLVITE Take 1 tablet (1 mg total) by mouth daily.   furosemide 40 MG tablet Commonly known as: LASIX Take 1 tablet (40 mg total) by mouth daily.   gabapentin 300 MG capsule Commonly known as: NEURONTIN Take 300 mg by mouth at bedtime.   lipase/protease/amylase 16109 UNITS Cpep capsule Commonly known as: CREON Take 1 capsule (36,000 Units total) by mouth 3 (three) times daily before meals.  loperamide 2 MG capsule Commonly known as: IMODIUM Take 1 capsule (2 mg total) by mouth as needed for diarrhea or loose stools.   mirtazapine 7.5 MG tablet Commonly known as: REMERON Take 7.5 mg by mouth at bedtime.   nitroGLYCERIN 0.4 MG SL tablet Commonly known as: NITROSTAT DISSOLVE 1 TABLET UNDER THE TONGUE EVERY 5 MINUTES AS  NEEDED FOR CHEST PAIN. MAX  OF 3 TABLETS IN 15 MINUTES. CALL 911 IF PAIN PERSISTS.   pantoprazole 40 MG tablet Commonly known as: PROTONIX Take 1 tablet (40 mg total) by mouth 2 (two) times daily.   rivastigmine 1.5 MG capsule Commonly known as: EXELON Take 1 capsule (1.5 mg total) by mouth 2 (two) times daily.   tiZANidine 2 MG tablet Commonly known as:  ZANAFLEX Take 2 mg by mouth at bedtime as needed.        Follow-up Information     Schedule an appointment as soon as possible for a visit  with [facility] with your PCP/Specialist as discussed.   Contact information: [email or web address] Call our physician referral line at [phone].               Discharge Exam: Filed Weights   05/12/23 0540 05/13/23 0553 05/14/23 0500  Weight: 69.4 kg 71 kg 71.1 kg   GENERAL:  Alert, pleasant, no acute distress  HEENT:  EOMI CARDIOVASCULAR:  RRR, no murmurs appreciated RESPIRATORY:  Clear to auscultation, no wheezing, rales, or rhonchi GASTROINTESTINAL:  Soft, nontender, nondistended EXTREMITIES:  No LE edema bilaterally NEURO:  No new focal deficits appreciated SKIN:  No rashes noted PSYCH:  Appropriate mood and affect   Condition at discharge: good  The results of significant diagnostics from this hospitalization (including imaging, microbiology, ancillary and laboratory) are listed below for reference.   Imaging Studies: US Venous Img Lower  Left (DVT Study) Result Date: 05/05/2023 CLINICAL DATA:  Left knee pain for 2 days.  No known injury. EXAM: LEFT LOWER EXTREMITY VENOUS DOPPLER ULTRASOUND TECHNIQUE: Gray-scale sonography with compression, as well as color and duplex ultrasound, were performed to evaluate the deep venous system(s) from the level of the common femoral vein through the popliteal and proximal calf veins. COMPARISON:  None Available. FINDINGS: VENOUS Normal compressibility of the common femoral, superficial femoral, and popliteal veins, as well as the visualized calf veins. Visualized portions of profunda femoral vein and great saphenous vein unremarkable. No filling defects to suggest DVT on grayscale or color Doppler imaging. Doppler waveforms show normal direction of venous flow, normal respiratory plasticity and response to augmentation. Limited views of the contralateral common femoral vein  are unremarkable. OTHER There is a 1.1 x 1.2 x 3.1 cm complex collection measured in the left medial popliteal fossa, which may represent popliteal/Baker cyst. Limitations: none IMPRESSION: *No acute deep vein thrombosis in the left lower extremity venous system. *Probable left popliteal/Baker's cyst measuring 1.1 x 1.2 x 3.1 cm. Electronically Signed   By: Jules Schick M.D.   On: 05/05/2023 16:46   DG Knee Complete 4 Views Left Result Date: 05/05/2023 CLINICAL DATA:  Left knee pain, no known injury. EXAM: LEFT KNEE - COMPLETE 4+ VIEW COMPARISON:  None Available. FINDINGS: Medial tibiofemoral joint space narrowing. Mild tricompartmental peripheral spurring. Small quadriceps tendon enthesophyte. Chondrocalcinosis. No fracture, erosion, or focal bone abnormality. No significant knee joint effusion. There are vascular calcifications. IMPRESSION: 1. Mild tricompartmental osteoarthritis. 2. Chondrocalcinosis. Electronically Signed   By: Narda Rutherford M.D.   On: 05/05/2023 16:05   DG Chest 2 View  Result Date: 05/05/2023 CLINICAL DATA:  Shortness of breath.  Chest pain for 2 weeks. EXAM: CHEST - 2 VIEW COMPARISON:  01/02/2023. FINDINGS: Bilateral lung fields are clear. Bilateral costophrenic angles are clear. Normal cardio-mediastinal silhouette. No acute osseous abnormalities. The soft tissues are within normal limits. Redemonstration of multiple surgical staples overlying the right axillary region and right breast region. IMPRESSION: No active cardiopulmonary disease. Electronically Signed   By: Jules Schick M.D.   On: 05/05/2023 13:04   DG Elbow 2 Views Right Result Date: 05/01/2023 CLINICAL DATA:  Right elbow pain EXAM: RIGHT ELBOW - 2 VIEW COMPARISON:  None Available. FINDINGS: No fractures There is mild degenerative changes involving the medial and lateral compartments with marginal calcifications adjacent to the medial humeral epicondyle. There is minimal soft tissue swelling involving the triceps  insertion region as well as adjacent subcutaneous tissues of the olecranon. IMPRESSION: *Mild degenerative changes involving the medial and lateral compartments with marginal calcifications adjacent to the medial humeral epicondyle. *Minimal soft tissue swelling involving the triceps insertion region as well as adjacent subcutaneous tissues of the olecranon. Electronically Signed   By: Shaaron Adler M.D.   On: 05/01/2023 10:26   CT ABDOMEN PELVIS WO CONTRAST Result Date: 04/16/2023 CLINICAL DATA:  Acute abdominal pain EXAM: CT ABDOMEN AND PELVIS WITHOUT CONTRAST TECHNIQUE: Multidetector CT imaging of the abdomen and pelvis was performed following the standard protocol without IV contrast. RADIATION DOSE REDUCTION: This exam was performed according to the departmental dose-optimization program which includes automated exposure control, adjustment of the mA and/or kV according to patient size and/or use of iterative reconstruction technique. COMPARISON:  09/22/2022 FINDINGS: Lower chest: No acute abnormality. Hepatobiliary: No focal liver abnormality is seen. Status post cholecystectomy. No biliary dilatation. Pancreas: Unremarkable. No pancreatic ductal dilatation or surrounding inflammatory changes. Spleen: Normal in size without focal abnormality. Adrenals/Urinary Tract: Adrenal glands are within normal limits. Kidneys show bilateral renal cystic change stable from the prior exam. No further follow-up is recommended. No renal calculi or obstructive changes are seen. The bladder is partially distended. Stomach/Bowel: Scattered diverticular change of the colon is noted without evidence of diverticulitis. Postsurgical changes are noted in the ascending colon with ileocolonic anastomosis. The anastomosis is patent. No obstructive changes are seen. The stomach is within normal limits with the exception of a sliding-type hiatal hernia. Vascular/Lymphatic: Aortic atherosclerosis. No enlarged abdominal or pelvic lymph  nodes. Reproductive: Status post hysterectomy. No adnexal masses. Other: No abdominal wall hernia or abnormality. No abdominopelvic ascites. Musculoskeletal: No acute or significant osseous findings. IMPRESSION: Postsurgical changes in the right colon without complicating factors. Small sliding-type hiatal hernia. Diverticulosis without diverticulitis. Electronically Signed   By: Alcide Clever M.D.   On: 04/16/2023 22:11    Microbiology: Results for orders placed or performed during the hospital encounter of 05/08/23  Gastrointestinal Panel by PCR , Stool     Status: None   Collection Time: 05/08/23  9:26 PM   Specimen: STOOL  Result Value Ref Range Status   Campylobacter species NOT DETECTED NOT DETECTED Final   Plesimonas shigelloides NOT DETECTED NOT DETECTED Final   Salmonella species NOT DETECTED NOT DETECTED Final   Yersinia enterocolitica NOT DETECTED NOT DETECTED Final   Vibrio species NOT DETECTED NOT DETECTED Final   Vibrio cholerae NOT DETECTED NOT DETECTED Final   Enteroaggregative E coli (EAEC) NOT DETECTED NOT DETECTED Final   Enteropathogenic E coli (EPEC) NOT DETECTED NOT DETECTED Final   Enterotoxigenic E coli (ETEC) NOT DETECTED NOT DETECTED Final  Shiga like toxin producing E coli (STEC) NOT DETECTED NOT DETECTED Final   Shigella/Enteroinvasive E coli (EIEC) NOT DETECTED NOT DETECTED Final   Cryptosporidium NOT DETECTED NOT DETECTED Final   Cyclospora cayetanensis NOT DETECTED NOT DETECTED Final   Entamoeba histolytica NOT DETECTED NOT DETECTED Final   Giardia lamblia NOT DETECTED NOT DETECTED Final   Adenovirus F40/41 NOT DETECTED NOT DETECTED Final   Astrovirus NOT DETECTED NOT DETECTED Final   Norovirus GI/GII NOT DETECTED NOT DETECTED Final   Rotavirus A NOT DETECTED NOT DETECTED Final   Sapovirus (I, II, IV, and V) NOT DETECTED NOT DETECTED Final    Comment: Performed at [facility], [address] [place], [place] [address]  C Difficile Quick Screen  w PCR reflex     Status: None   Collection Time: 05/08/23  9:26 PM   Specimen: STOOL  Result Value Ref Range Status   C Diff antigen NEGATIVE NEGATIVE Final   C Diff toxin NEGATIVE NEGATIVE Final   C Diff interpretation No C. difficile detected.  Final    Comment: Performed at [facility], [address] [place], [place] [address]   *Note: Due to a large number of results and/or encounters for the requested time period, some results have not been displayed. A complete set of results can be found in Results Review.    Labs: CBC: Recent Labs  Lab 05/10/23 0527 05/11/23 0412 05/12/23 0557 05/13/23 0355 05/14/23 0802  WBC 14.0* 13.8* 14.2* 12.3* 12.8*  HGB 9.1* 9.7* 9.3* 8.6* 9.1*  HCT 27.5* 29.4* 29.8* 27.2* 29.8*  MCV 82.6 83.5 86.4 88.3 89.2  PLT 343 341 360 303 300   Basic Metabolic Panel: Recent Labs  Lab 05/09/23 0442 05/09/23 1423 05/10/23 0527 05/11/23 0412 05/12/23 0557 05/13/23 0355 05/14/23 0802  NA 135   < > 139 141 141 141 139  K 2.6*   < > 2.9* 3.7 4.0 4.0 4.4  CL 102   < > 105 100 105 103 105  CO2 16*   < > 16* 23 27 26 25   GLUCOSE 76   < > 84 135* 106* 120* 151*  BUN 122*   < > 115* 96* 81* 79* 75*  CREATININE 5.21*   < > 4.53* 3.59* 3.48* 3.75* 3.95*  CALCIUM 6.8*   < > 7.4* 8.9 8.7* 8.9 8.7*  MG 0.9*   < > 1.6* 1.9 1.8 1.5* 1.3*  PHOS 6.8*  --  5.3*  --   --   --   --    < > = values in this interval not displayed.   Liver Function Tests: Recent Labs  Lab 05/08/23 1833 05/10/23 0527 05/14/23 0802  AST 13*  --  12*  ALT 13  --  9  ALKPHOS 60  --  55  BILITOT 0.5  --  0.4  PROT 7.2  --  6.3*  ALBUMIN 2.9* 2.3* 2.3*   CBG: Recent Labs  Lab 05/13/23 0815 05/13/23 1138 05/13/23 1655 05/13/23 2012 05/14/23 0731  GLUCAP 150* 137* 220* 145* 115*    Discharge time spent: less than 30 minutes.  Signed: Deanna Artis, DO [facility] 05/14/2023

## 2023-05-17 ENCOUNTER — Other Ambulatory Visit: Payer: Self-pay

## 2023-05-17 ENCOUNTER — Emergency Department (HOSPITAL_COMMUNITY)
Admission: EM | Admit: 2023-05-17 | Discharge: 2023-05-17 | Disposition: A | Discharge status: Home / Self Care | Payer: Medicare HMO | Attending: Emergency Medicine | Admitting: Emergency Medicine

## 2023-05-17 ENCOUNTER — Emergency Department (HOSPITAL_COMMUNITY): Payer: Medicare HMO

## 2023-05-17 ENCOUNTER — Encounter (HOSPITAL_COMMUNITY): Payer: Self-pay

## 2023-05-17 ENCOUNTER — Other Ambulatory Visit (HOSPITAL_COMMUNITY): Payer: Self-pay

## 2023-05-17 DIAGNOSIS — Z7901 Long term (current) use of anticoagulants: Secondary | ICD-10-CM | POA: Diagnosis not present

## 2023-05-17 DIAGNOSIS — N189 Chronic kidney disease, unspecified: Secondary | ICD-10-CM | POA: Diagnosis not present

## 2023-05-17 DIAGNOSIS — I130 Hypertensive heart and chronic kidney disease with heart failure and stage 1 through stage 4 chronic kidney disease, or unspecified chronic kidney disease: Secondary | ICD-10-CM | POA: Insufficient documentation

## 2023-05-17 DIAGNOSIS — M19021 Primary osteoarthritis, right elbow: Secondary | ICD-10-CM | POA: Diagnosis not present

## 2023-05-17 DIAGNOSIS — I509 Heart failure, unspecified: Secondary | ICD-10-CM | POA: Insufficient documentation

## 2023-05-17 DIAGNOSIS — R2232 Localized swelling, mass and lump, left upper limb: Secondary | ICD-10-CM | POA: Diagnosis not present

## 2023-05-17 DIAGNOSIS — M7989 Other specified soft tissue disorders: Secondary | ICD-10-CM | POA: Diagnosis not present

## 2023-05-17 DIAGNOSIS — Y9389 Activity, other specified: Secondary | ICD-10-CM | POA: Diagnosis not present

## 2023-05-17 DIAGNOSIS — M7021 Olecranon bursitis, right elbow: Secondary | ICD-10-CM | POA: Insufficient documentation

## 2023-05-17 DIAGNOSIS — E1122 Type 2 diabetes mellitus with diabetic chronic kidney disease: Secondary | ICD-10-CM | POA: Diagnosis not present

## 2023-05-17 DIAGNOSIS — M25521 Pain in right elbow: Secondary | ICD-10-CM | POA: Diagnosis not present

## 2023-05-17 DIAGNOSIS — I2510 Atherosclerotic heart disease of native coronary artery without angina pectoris: Secondary | ICD-10-CM | POA: Diagnosis not present

## 2023-05-17 DIAGNOSIS — Z853 Personal history of malignant neoplasm of breast: Secondary | ICD-10-CM | POA: Insufficient documentation

## 2023-05-17 LAB — BASIC METABOLIC PANEL
Anion gap: 13 (ref 5–15)
BUN: 66 mg/dL — ABNORMAL HIGH (ref 8–23)
CO2: 22 mmol/L (ref 22–32)
Calcium: 9 mg/dL (ref 8.9–10.3)
Chloride: 104 mmol/L (ref 98–111)
Creatinine, Ser: 3.94 mg/dL — ABNORMAL HIGH (ref 0.44–1.00)
GFR, Estimated: 11 mL/min — ABNORMAL LOW (ref >60)
Glucose, Bld: 97 mg/dL (ref 70–99)
Potassium: 4.2 mmol/L (ref 3.5–5.1)
Sodium: 139 mmol/L (ref 135–145)

## 2023-05-17 LAB — CBC WITH DIFFERENTIAL/PLATELET
Abs Immature Granulocytes: 0.3 10*3/uL — ABNORMAL HIGH (ref 0.00–0.07)
Basophils Absolute: 0 10*3/uL (ref 0.0–0.1)
Basophils Relative: 0 %
Eosinophils Absolute: 0 10*3/uL (ref 0.0–0.5)
Eosinophils Relative: 0 %
HCT: 29.2 % — ABNORMAL LOW (ref 36.0–46.0)
Hemoglobin: 8.8 g/dL — ABNORMAL LOW (ref 12.0–15.0)
Immature Granulocytes: 2 %
Lymphocytes Relative: 7 %
Lymphs Abs: 1 10*3/uL (ref 0.7–4.0)
MCH: 27.4 pg (ref 26.0–34.0)
MCHC: 30.1 g/dL (ref 30.0–36.0)
MCV: 91 fL (ref 80.0–100.0)
Monocytes Absolute: 0.8 10*3/uL (ref 0.1–1.0)
Monocytes Relative: 6 %
Neutro Abs: 11.6 10*3/uL — ABNORMAL HIGH (ref 1.7–7.7)
Neutrophils Relative %: 85 %
Platelets: 230 10*3/uL (ref 150–400)
RBC: 3.21 MIL/uL — ABNORMAL LOW (ref 3.87–5.11)
RDW: 18.4 % — ABNORMAL HIGH (ref 11.5–15.5)
WBC: 13.7 10*3/uL — ABNORMAL HIGH (ref 4.0–10.5)
nRBC: 0 % (ref 0.0–0.2)

## 2023-05-17 MED ORDER — CEPHALEXIN 500 MG PO CAPS
500.0000 mg | ORAL_CAPSULE | Freq: Every day | ORAL | 0 refills | Status: DC
  Filled 2023-05-17: qty 14, 14d supply, fill #0

## 2023-05-17 MED ORDER — CEPHALEXIN 500 MG PO CAPS: 500.0000 mg | ORAL_CAPSULE | Freq: Every day | ORAL | 0 refills | Status: DC

## 2023-05-17 MED ORDER — DOXYCYCLINE HYCLATE 100 MG PO CAPS: 100.0000 mg | ORAL_CAPSULE | Freq: Two times a day (BID) | ORAL | 0 refills | Status: DC

## 2023-05-17 MED ORDER — DOXYCYCLINE HYCLATE 100 MG PO CAPS
100.0000 mg | ORAL_CAPSULE | Freq: Two times a day (BID) | ORAL | 0 refills | Status: DC
  Filled 2023-05-17: qty 20, 10d supply, fill #0

## 2023-05-17 MED ORDER — HYDROCODONE-ACETAMINOPHEN 5-325 MG PO TABS: 2.0000 | ORAL_TABLET | Freq: Four times a day (QID) | ORAL | 0 refills | Status: DC | PRN

## 2023-05-17 NOTE — Discharge Instructions (Addendum)
 [facility] [place] [facility]:  Thank you for allowing [place] to take care of you today.  We hope you begin feeling better soon.  To-Do: Please follow-up with your primary doctor to discuss any findings from today's emergency department visit. If your symptoms persist, please follow up with orthopedic surgery using the attached information. Please return to the Emergency Department or call 911 if you experience chest pain, shortness of breath, severe pain, severe fever, altered mental status, or have any reason to think that you need emergency medical care.  Thank you again.  Hope you feel better soon.  Department of Emergency Medicine [facility]

## 2023-05-17 NOTE — ED Triage Notes (Signed)
 Pt was sent by [facility] for possible possible septic right elbow. Pt c/o right elbow pain that radiates to right shoulder and swelling started this morning upon waking at 0300.Pt has 3+ swelling of right elbow and it's hot to touch

## 2023-05-17 NOTE — ED Provider Notes (Signed)
 [place] EMERGENCY DEPARTMENT AT [facility] Provider Note  Arrival date/time:05/17/2023 5:37 PM  HPI/ROS   [name] is a 78 y.o. female with PMH significant for T2DM, HFrEF, CAD, GERD, gout, hx DVT's on Eliquis who presents for right elbow pain  History is provided by patient and family.  Patient was recently discharged from the hospital last Tuesday.  She*complaining of right-sided elbow pain on Wednesday.  She woke up this morning and felt that it was much worse.  She went to urgent care and was sent here due to concern for infected elbow. Denies fevers, chills, nausea, emesis, abdominal pain. Is any trauma to the elbow.  A complete ROS was performed with pertinent positives/negatives noted above.   ED Course and Medical Decision Making   I personally reviewed the patient's vitals.  ER provider interpretation of Labs: CBC reveals very mild leukocytosis to 13.7, hemoglobin is 8.8 which is baseline for her in the setting of ESRD BMP shows no significant metabolic derangement, BUN is 66, which improved from recent discharge.  Her creatinine is 3.94 which is stable from her discharge.  Normal anion gap.  Assessment/Plan: 78 year old patient presents today from urgent care with right elbow pain.  Patient was recently discharged from the hospital on 05/14/2023 due to dehydration, AKI, diarrhea.  Today, patient is well-appearing on exam.  She does have pain on active and passive range of motion of the right elbow. She also has erythema and warmth to the elbow concerning for olecranon bursitis.  Her lab workup is reassuring with only very mild leukocytosis.  Her vital signs are also within normal limits and she does not meet SIRS/sepsis criteria. X-ray of her right elbow-reveals soft tissue swelling of the posterior elbow and proximal forearm.  I spoke with the emergency pharmacist to discuss antibiotics for bursitis in the setting of CKD. We agreed on Keflex and  doxycycline. With her decreased renal fxn Keflex 500 mg q 24h, Doxy no adjustment needed.  Discussed plan with patient and family and they voiced agreement with plan to discharge home with antibiotics. I did give them information to follow-up with orthopedics if symptoms persist and/or worsen.   Disposition:  I discussed the plan for discharge with the patient and/or their surrogate at bedside prior to discharge and they were in agreement with the plan and verbalized understanding of the return precautions provided. All questions answered to the best of my ability. Ultimately, the patient was discharged in stable condition with stable vital signs. I am reassured that they are capable of close follow up and good social support at home.   Clinical Impression:  1. Olecranon bursitis of right elbow     Rx / DC Orders ED Discharge Orders          Ordered    doxycycline (VIBRAMYCIN) 100 MG capsule  2 times daily,   Status:  Discontinued        05/17/23 1654    cephALEXin (KEFLEX) 500 MG capsule  Daily,   Status:  Discontinued        05/17/23 1654    cephALEXin (KEFLEX) 500 MG capsule  Daily        05/17/23 1718    doxycycline (VIBRAMYCIN) 100 MG capsule  2 times daily        05/17/23 1718    HYDROcodone-acetaminophen (NORCO/VICODIN) 5-325 MG tablet  Every 6 hours PRN        05/17/23 1737  The plan for this patient was discussed with Dr. Rush Landmark, who voiced agreement and who oversaw evaluation and treatment of this patient.   Clinical Complexity A medically appropriate history, review of systems, and physical exam was performed.  Patient's presentation is most consistent with acute presentation with potential threat to life or bodily function.  Medical Decision Making Amount and/or Complexity of Data Reviewed Labs: ordered. Radiology: ordered.  Risk Prescription drug management.    Physical Exam and Medical History   Vitals:   05/17/23 1129 05/17/23 1132  05/17/23 1556  BP: 110/77  98/72  Pulse: 67  79  Resp: 18  16  Temp: 98.2 F (36.8 C)  98.3 F (36.8 C)  TempSrc:   Oral  SpO2: 100%  100%  Weight:  71.1 kg   Height:  5\' 4"  (1.626 m)     Physical Exam Vitals and nursing note reviewed.  Constitutional:      General: She is not in acute distress.    Appearance: She is well-developed.  HENT:     Head: Normocephalic and atraumatic.  Eyes:     Conjunctiva/sclera: Conjunctivae normal.  Cardiovascular:     Rate and Rhythm: Normal rate.  Musculoskeletal:     Cervical back: Neck supple.     Comments: Edema and warmth to right elbow. Pain on passive and active ROM of right elbow  Skin:    General: Skin is warm and dry.     Capillary Refill: Capillary refill takes less than 2 seconds.  Neurological:     Mental Status: She is alert.  Psychiatric:        Mood and Affect: Mood normal.     Medical History: Allergies  Allergen Reactions   Anesthetics, Halogenated Other (See Comments)    Per patient's daughter, she can not tolerate anesthetics.    Etomidate Other (See Comments)    Heart stopped during anesthesia   Fentanyl Other (See Comments)    Heart stopped during anesthesia    Percocet [Oxycodone-Acetaminophen] Other (See Comments)    Headaches    Diazepam Other (See Comments)    Agitation   Lorazepam Nausea Only   Tramadol Hcl Nausea And Vomiting   Haloperidol Nausea And Vomiting    **Haldol**   Morphine Sulfate Nausea Only    agitation   Propoxyphene Nausea And Vomiting    **Darvocet** no longer available   Versed [Midazolam] Other (See Comments)    Heart stopped during anesthesia   Past Medical History:  Diagnosis Date   Abdominal discomfort    Chronic N/V/D. Presumptive dx Crohn's dx per elevated p ANCA. Failed Entocort and Pentasa. Sep 2003 - ileocolectomy c anastomosis per Dr Orson Slick 2/2 adhesions - path was hegative for Crohns. EGD, Sm bowel follow through (11/03), and an eteroclysis (10/03) were  unrevealing. Cuases hypomag and hypocalcemia.   Adnexal mass 8/03   s/p lap BSO (R ovarian fibroma) & lysis of adhesions   Allergy    Seasonal   Anemia    Multifactorial. Baseline HgB 10-11 ish. B12 def - 150 in 3/10. Fe Def - ferritin 35 3/10. Both are being repleted.   Breast cancer (HCC) 03/16/13   right, 5 o'clock   CAD (coronary artery disease) 1996   1996 - PTCA and angioplasty diagonal branch. 2000 - Rotoblator & angiopllasty of diagonal. 2006 - subendocardial AMI, DES to proximal LAD.Marland Kitchen Also had 90% stenosis in distal apical LAD. EF 55 with apical hypokinesis. Indefinite ASA and Plavix.   CHF (congestive heart failure) (  HCC)    Chronic kidney disease    Chronic renal insuff baseline Cr 1.2 - 1.4 ish.   Chronic kidney disease, stage III (moderate) (HCC)    Chronic renal insuff baseline Cr 1.2 - 1.4 ish. New baseline about 2 (2018)   Chronic pain    CT 10/10 = Spinal stenosis L2 - S1.   COVID-19 11/02/2020   Diabetes mellitus    Insulin dependent   Diabetes mellitus type 2 in obese 05/03/2010   Managed on lantus and novolog. Has diabetic nephropathy. Metformin D/C'd 2012 2/2 creatinine.  No diabetic retinopathy per 3/11.    Gout    Hx of radiation therapy 06/02/13- 07/16/13   right rbeast 4500 cGy 25 sessions, right breast boost 1600 cGy in 8 sessions   Hyperlipidemia    Managed with both a statin and Welchol. Welchol stopped 2014 2/2 cost and started on fenofibrate    Hypertension    2006 B renal arteries patent. 2003 MRA - no RAS. 2003 pheo W/U Dr Caryn Section reportedly negative.   Hypoxia 07/23/2017   Lupus    Lymphedema of breast    Personal history of radiation therapy 2015   Pneumonia of left lung due to infectious organism    RBBB    Renal vein thrombosis (HCC)    SBO (small bowel obstruction) (HCC) 09/17/2017   Secondary hyperparathyroidism (HCC)    Stroke (HCC)    Incidental finding MRI 2002 L lacunar infarct   Vitamin B12 deficiency    Vitamin D deficiency    Wears  dentures    top    Past Surgical History:  Procedure Laterality Date   ABDOMINAL HYSTERECTOMY     BILATERAL SALPINGOOPHORECTOMY  8/03   Lap BSO (R ovarian fibroma) and adhesion lysis   BIOPSY  09/08/2020   Procedure: BIOPSY;  Surgeon: Bernette Redbird, MD;  Location: [facility];  Service: Endoscopy;;   BOWEL RESECTION  2003   ileocolectomy with anastomosis 2/2 adhesions   BREAST BIOPSY Right 2015   BREAST BIOPSY Right 07/2014   BREAST LUMPECTOMY Right 04/22/2013   BREAST LUMPECTOMY WITH NEEDLE LOCALIZATION AND AXILLARY SENTINEL LYMPH NODE BX Right 04/22/2013   Procedure: BREAST LUMPECTOMY WITH NEEDLE LOCALIZATION AND AXILLARY SENTINEL LYMPH NODE BX;  Surgeon: Almond Lint, MD;  Location: [facility];  Service: General;  Laterality: Right;   CARDIAC CATHETERIZATION     2 stents   CHOLECYSTECTOMY     COLONOSCOPY     ESOPHAGOGASTRODUODENOSCOPY N/A 09/08/2020   Procedure: ESOPHAGOGASTRODUODENOSCOPY (EGD);  Surgeon: Bernette Redbird, MD;  Location: [facility];  Service: Endoscopy;  Laterality: N/A;   HEMICOLECTOMY     R sided hemicolectomy   HERNIA REPAIR     Ventral hernia repair   PTCA  4/06   Family History  Problem Relation Age of Onset   Breast cancer Mother 66   Pancreatic cancer Brother 40   Lung cancer Maternal Aunt    Breast cancer Maternal Aunt    Kidney failure Maternal Aunt    Prostate cancer Maternal Uncle    Heart attack Maternal Grandmother    Breast cancer Maternal Grandmother    Colon cancer Maternal Grandmother    Clotting disorder Maternal Grandmother    Diabetes Maternal Grandmother    Hypertension Daughter    Pancreatic cancer Other     Social History   Tobacco Use   Smoking status: Never   Smokeless tobacco: Never  Vaping Use   Vaping status: Never Used  Substance Use Topics  Alcohol use: No    Alcohol/week: 0.0 standard drinks of alcohol   Drug use: No    Procedures   If procedures were preformed on this patient, they are  listed below:  Procedures   -------- HPI and MDM generated using voice dictation software and may contain dictation errors. Please contact me for any clarification or with any questions.   Cephus Slater, MD Emergency Medicine PGY-2    Caron Presume, MD 05/17/23 1737    Tegeler, Canary Brim, MD 05/19/23 1426

## 2023-05-18 ENCOUNTER — Other Ambulatory Visit (HOSPITAL_COMMUNITY): Payer: Self-pay

## 2023-05-18 DIAGNOSIS — M329 Systemic lupus erythematosus, unspecified: Secondary | ICD-10-CM | POA: Diagnosis not present

## 2023-05-18 DIAGNOSIS — E1169 Type 2 diabetes mellitus with other specified complication: Secondary | ICD-10-CM | POA: Diagnosis not present

## 2023-05-18 DIAGNOSIS — E669 Obesity, unspecified: Secondary | ICD-10-CM | POA: Diagnosis not present

## 2023-05-18 DIAGNOSIS — N179 Acute kidney failure, unspecified: Secondary | ICD-10-CM | POA: Diagnosis not present

## 2023-05-18 DIAGNOSIS — N184 Chronic kidney disease, stage 4 (severe): Secondary | ICD-10-CM | POA: Diagnosis not present

## 2023-05-18 DIAGNOSIS — I130 Hypertensive heart and chronic kidney disease with heart failure and stage 1 through stage 4 chronic kidney disease, or unspecified chronic kidney disease: Secondary | ICD-10-CM | POA: Diagnosis not present

## 2023-05-18 DIAGNOSIS — E211 Secondary hyperparathyroidism, not elsewhere classified: Secondary | ICD-10-CM | POA: Diagnosis not present

## 2023-05-18 DIAGNOSIS — I5032 Chronic diastolic (congestive) heart failure: Secondary | ICD-10-CM | POA: Diagnosis not present

## 2023-05-18 DIAGNOSIS — E1122 Type 2 diabetes mellitus with diabetic chronic kidney disease: Secondary | ICD-10-CM | POA: Diagnosis not present

## 2023-05-22 DIAGNOSIS — M329 Systemic lupus erythematosus, unspecified: Secondary | ICD-10-CM | POA: Diagnosis not present

## 2023-05-22 DIAGNOSIS — I5032 Chronic diastolic (congestive) heart failure: Secondary | ICD-10-CM | POA: Diagnosis not present

## 2023-05-22 DIAGNOSIS — I130 Hypertensive heart and chronic kidney disease with heart failure and stage 1 through stage 4 chronic kidney disease, or unspecified chronic kidney disease: Secondary | ICD-10-CM | POA: Diagnosis not present

## 2023-05-22 DIAGNOSIS — E211 Secondary hyperparathyroidism, not elsewhere classified: Secondary | ICD-10-CM | POA: Diagnosis not present

## 2023-05-22 DIAGNOSIS — E1122 Type 2 diabetes mellitus with diabetic chronic kidney disease: Secondary | ICD-10-CM | POA: Diagnosis not present

## 2023-05-22 DIAGNOSIS — E1169 Type 2 diabetes mellitus with other specified complication: Secondary | ICD-10-CM | POA: Diagnosis not present

## 2023-05-22 DIAGNOSIS — N179 Acute kidney failure, unspecified: Secondary | ICD-10-CM | POA: Diagnosis not present

## 2023-05-22 DIAGNOSIS — E669 Obesity, unspecified: Secondary | ICD-10-CM | POA: Diagnosis not present

## 2023-05-22 DIAGNOSIS — N184 Chronic kidney disease, stage 4 (severe): Secondary | ICD-10-CM | POA: Diagnosis not present

## 2023-05-23 DIAGNOSIS — I5032 Chronic diastolic (congestive) heart failure: Secondary | ICD-10-CM | POA: Diagnosis not present

## 2023-05-23 DIAGNOSIS — E211 Secondary hyperparathyroidism, not elsewhere classified: Secondary | ICD-10-CM | POA: Diagnosis not present

## 2023-05-23 DIAGNOSIS — M329 Systemic lupus erythematosus, unspecified: Secondary | ICD-10-CM | POA: Diagnosis not present

## 2023-05-23 DIAGNOSIS — M7021 Olecranon bursitis, right elbow: Secondary | ICD-10-CM | POA: Diagnosis not present

## 2023-05-23 DIAGNOSIS — E1122 Type 2 diabetes mellitus with diabetic chronic kidney disease: Secondary | ICD-10-CM | POA: Diagnosis not present

## 2023-05-23 DIAGNOSIS — E669 Obesity, unspecified: Secondary | ICD-10-CM | POA: Diagnosis not present

## 2023-05-23 DIAGNOSIS — N184 Chronic kidney disease, stage 4 (severe): Secondary | ICD-10-CM | POA: Diagnosis not present

## 2023-05-23 DIAGNOSIS — E1169 Type 2 diabetes mellitus with other specified complication: Secondary | ICD-10-CM | POA: Diagnosis not present

## 2023-05-23 DIAGNOSIS — I130 Hypertensive heart and chronic kidney disease with heart failure and stage 1 through stage 4 chronic kidney disease, or unspecified chronic kidney disease: Secondary | ICD-10-CM | POA: Diagnosis not present

## 2023-05-23 DIAGNOSIS — N179 Acute kidney failure, unspecified: Secondary | ICD-10-CM | POA: Diagnosis not present

## 2023-05-24 DIAGNOSIS — M25561 Pain in right knee: Secondary | ICD-10-CM | POA: Diagnosis not present

## 2023-05-25 ENCOUNTER — Encounter (HOSPITAL_COMMUNITY): Payer: Self-pay

## 2023-05-25 ENCOUNTER — Emergency Department (HOSPITAL_COMMUNITY)

## 2023-05-25 ENCOUNTER — Inpatient Hospital Stay (HOSPITAL_COMMUNITY)
Admission: EM | Admit: 2023-05-25 | Discharge: 2023-06-01 | DRG: 291 | Disposition: A | Discharge status: Home Health | Attending: Internal Medicine | Admitting: Internal Medicine

## 2023-05-25 ENCOUNTER — Other Ambulatory Visit: Payer: Self-pay

## 2023-05-25 DIAGNOSIS — E119 Type 2 diabetes mellitus without complications: Secondary | ICD-10-CM

## 2023-05-25 DIAGNOSIS — D529 Folate deficiency anemia, unspecified: Secondary | ICD-10-CM | POA: Diagnosis present

## 2023-05-25 DIAGNOSIS — Z980 Intestinal bypass and anastomosis status: Secondary | ICD-10-CM

## 2023-05-25 DIAGNOSIS — Z8673 Personal history of transient ischemic attack (TIA), and cerebral infarction without residual deficits: Secondary | ICD-10-CM

## 2023-05-25 DIAGNOSIS — I5021 Acute systolic (congestive) heart failure: Secondary | ICD-10-CM | POA: Diagnosis not present

## 2023-05-25 DIAGNOSIS — K449 Diaphragmatic hernia without obstruction or gangrene: Secondary | ICD-10-CM | POA: Diagnosis present

## 2023-05-25 DIAGNOSIS — K2970 Gastritis, unspecified, without bleeding: Secondary | ICD-10-CM | POA: Diagnosis not present

## 2023-05-25 DIAGNOSIS — D122 Benign neoplasm of ascending colon: Secondary | ICD-10-CM | POA: Diagnosis not present

## 2023-05-25 DIAGNOSIS — K222 Esophageal obstruction: Secondary | ICD-10-CM | POA: Diagnosis present

## 2023-05-25 DIAGNOSIS — F32A Depression, unspecified: Secondary | ICD-10-CM | POA: Diagnosis present

## 2023-05-25 DIAGNOSIS — K5730 Diverticulosis of large intestine without perforation or abscess without bleeding: Secondary | ICD-10-CM | POA: Diagnosis present

## 2023-05-25 DIAGNOSIS — M7989 Other specified soft tissue disorders: Secondary | ICD-10-CM | POA: Diagnosis present

## 2023-05-25 DIAGNOSIS — F0393 Unspecified dementia, unspecified severity, with mood disturbance: Secondary | ICD-10-CM | POA: Diagnosis present

## 2023-05-25 DIAGNOSIS — N179 Acute kidney failure, unspecified: Secondary | ICD-10-CM | POA: Diagnosis present

## 2023-05-25 DIAGNOSIS — N189 Chronic kidney disease, unspecified: Secondary | ICD-10-CM | POA: Diagnosis present

## 2023-05-25 DIAGNOSIS — Z884 Allergy status to anesthetic agent status: Secondary | ICD-10-CM

## 2023-05-25 DIAGNOSIS — Z9861 Coronary angioplasty status: Secondary | ICD-10-CM

## 2023-05-25 DIAGNOSIS — G8929 Other chronic pain: Secondary | ICD-10-CM | POA: Diagnosis present

## 2023-05-25 DIAGNOSIS — I2510 Atherosclerotic heart disease of native coronary artery without angina pectoris: Secondary | ICD-10-CM | POA: Diagnosis present

## 2023-05-25 DIAGNOSIS — N184 Chronic kidney disease, stage 4 (severe): Secondary | ICD-10-CM | POA: Diagnosis not present

## 2023-05-25 DIAGNOSIS — M898X9 Other specified disorders of bone, unspecified site: Secondary | ICD-10-CM | POA: Diagnosis present

## 2023-05-25 DIAGNOSIS — E440 Moderate protein-calorie malnutrition: Secondary | ICD-10-CM | POA: Diagnosis present

## 2023-05-25 DIAGNOSIS — N280 Ischemia and infarction of kidney: Secondary | ICD-10-CM | POA: Diagnosis present

## 2023-05-25 DIAGNOSIS — N185 Chronic kidney disease, stage 5: Secondary | ICD-10-CM | POA: Diagnosis present

## 2023-05-25 DIAGNOSIS — K2950 Unspecified chronic gastritis without bleeding: Secondary | ICD-10-CM | POA: Diagnosis present

## 2023-05-25 DIAGNOSIS — Z888 Allergy status to other drugs, medicaments and biological substances status: Secondary | ICD-10-CM

## 2023-05-25 DIAGNOSIS — Z885 Allergy status to narcotic agent status: Secondary | ICD-10-CM

## 2023-05-25 DIAGNOSIS — N2581 Secondary hyperparathyroidism of renal origin: Secondary | ICD-10-CM | POA: Diagnosis present

## 2023-05-25 DIAGNOSIS — Z794 Long term (current) use of insulin: Secondary | ICD-10-CM | POA: Diagnosis not present

## 2023-05-25 DIAGNOSIS — K648 Other hemorrhoids: Secondary | ICD-10-CM | POA: Diagnosis present

## 2023-05-25 DIAGNOSIS — Z833 Family history of diabetes mellitus: Secondary | ICD-10-CM

## 2023-05-25 DIAGNOSIS — I2489 Other forms of acute ischemic heart disease: Secondary | ICD-10-CM | POA: Diagnosis present

## 2023-05-25 DIAGNOSIS — D631 Anemia in chronic kidney disease: Secondary | ICD-10-CM | POA: Diagnosis present

## 2023-05-25 DIAGNOSIS — I132 Hypertensive heart and chronic kidney disease with heart failure and with stage 5 chronic kidney disease, or end stage renal disease: Principal | ICD-10-CM | POA: Diagnosis present

## 2023-05-25 DIAGNOSIS — I4510 Unspecified right bundle-branch block: Secondary | ICD-10-CM | POA: Diagnosis present

## 2023-05-25 DIAGNOSIS — Z853 Personal history of malignant neoplasm of breast: Secondary | ICD-10-CM

## 2023-05-25 DIAGNOSIS — R0602 Shortness of breath: Secondary | ICD-10-CM | POA: Diagnosis present

## 2023-05-25 DIAGNOSIS — Z923 Personal history of irradiation: Secondary | ICD-10-CM

## 2023-05-25 DIAGNOSIS — Z90710 Acquired absence of both cervix and uterus: Secondary | ICD-10-CM

## 2023-05-25 DIAGNOSIS — Z7901 Long term (current) use of anticoagulants: Secondary | ICD-10-CM

## 2023-05-25 DIAGNOSIS — D509 Iron deficiency anemia, unspecified: Secondary | ICD-10-CM | POA: Diagnosis not present

## 2023-05-25 DIAGNOSIS — I509 Heart failure, unspecified: Principal | ICD-10-CM

## 2023-05-25 DIAGNOSIS — Z8616 Personal history of COVID-19: Secondary | ICD-10-CM

## 2023-05-25 DIAGNOSIS — I5032 Chronic diastolic (congestive) heart failure: Secondary | ICD-10-CM | POA: Diagnosis not present

## 2023-05-25 DIAGNOSIS — R1310 Dysphagia, unspecified: Secondary | ICD-10-CM | POA: Diagnosis not present

## 2023-05-25 DIAGNOSIS — E1122 Type 2 diabetes mellitus with diabetic chronic kidney disease: Secondary | ICD-10-CM | POA: Diagnosis present

## 2023-05-25 DIAGNOSIS — R609 Edema, unspecified: Secondary | ICD-10-CM | POA: Diagnosis not present

## 2023-05-25 DIAGNOSIS — D649 Anemia, unspecified: Secondary | ICD-10-CM | POA: Diagnosis present

## 2023-05-25 DIAGNOSIS — Z79899 Other long term (current) drug therapy: Secondary | ICD-10-CM

## 2023-05-25 DIAGNOSIS — E785 Hyperlipidemia, unspecified: Secondary | ICD-10-CM | POA: Diagnosis present

## 2023-05-25 DIAGNOSIS — Z803 Family history of malignant neoplasm of breast: Secondary | ICD-10-CM

## 2023-05-25 DIAGNOSIS — N281 Cyst of kidney, acquired: Secondary | ICD-10-CM | POA: Diagnosis not present

## 2023-05-25 DIAGNOSIS — K635 Polyp of colon: Secondary | ICD-10-CM | POA: Diagnosis not present

## 2023-05-25 DIAGNOSIS — I110 Hypertensive heart disease with heart failure: Secondary | ICD-10-CM | POA: Diagnosis not present

## 2023-05-25 DIAGNOSIS — Z955 Presence of coronary angioplasty implant and graft: Secondary | ICD-10-CM | POA: Diagnosis not present

## 2023-05-25 DIAGNOSIS — I10 Essential (primary) hypertension: Secondary | ICD-10-CM | POA: Diagnosis present

## 2023-05-25 DIAGNOSIS — I5033 Acute on chronic diastolic (congestive) heart failure: Secondary | ICD-10-CM | POA: Diagnosis present

## 2023-05-25 DIAGNOSIS — Z9049 Acquired absence of other specified parts of digestive tract: Secondary | ICD-10-CM

## 2023-05-25 DIAGNOSIS — E872 Acidosis, unspecified: Secondary | ICD-10-CM | POA: Diagnosis present

## 2023-05-25 DIAGNOSIS — Z8249 Family history of ischemic heart disease and other diseases of the circulatory system: Secondary | ICD-10-CM

## 2023-05-25 DIAGNOSIS — R079 Chest pain, unspecified: Secondary | ICD-10-CM | POA: Diagnosis not present

## 2023-05-25 DIAGNOSIS — I130 Hypertensive heart and chronic kidney disease with heart failure and stage 1 through stage 4 chronic kidney disease, or unspecified chronic kidney disease: Secondary | ICD-10-CM | POA: Diagnosis not present

## 2023-05-25 DIAGNOSIS — I252 Old myocardial infarction: Secondary | ICD-10-CM

## 2023-05-25 DIAGNOSIS — K922 Gastrointestinal hemorrhage, unspecified: Secondary | ICD-10-CM | POA: Diagnosis not present

## 2023-05-25 DIAGNOSIS — Z86718 Personal history of other venous thrombosis and embolism: Secondary | ICD-10-CM

## 2023-05-25 DIAGNOSIS — I493 Ventricular premature depolarization: Secondary | ICD-10-CM | POA: Diagnosis present

## 2023-05-25 DIAGNOSIS — R197 Diarrhea, unspecified: Secondary | ICD-10-CM | POA: Diagnosis not present

## 2023-05-25 DIAGNOSIS — E8779 Other fluid overload: Secondary | ICD-10-CM | POA: Diagnosis not present

## 2023-05-25 DIAGNOSIS — I823 Embolism and thrombosis of renal vein: Secondary | ICD-10-CM | POA: Diagnosis present

## 2023-05-25 DIAGNOSIS — K2951 Unspecified chronic gastritis with bleeding: Secondary | ICD-10-CM | POA: Diagnosis not present

## 2023-05-25 DIAGNOSIS — Z6826 Body mass index (BMI) 26.0-26.9, adult: Secondary | ICD-10-CM

## 2023-05-25 HISTORY — DX: Chronic kidney disease, stage 4 (severe): N18.4

## 2023-05-25 LAB — TYPE AND SCREEN
ABO/RH(D): B NEG
Antibody Screen: NEGATIVE

## 2023-05-25 LAB — HEPATIC FUNCTION PANEL
ALT: 11 U/L (ref 0–44)
AST: 14 U/L — ABNORMAL LOW (ref 15–41)
Albumin: 2.5 g/dL — ABNORMAL LOW (ref 3.5–5.0)
Alkaline Phosphatase: 91 U/L (ref 38–126)
Bilirubin, Direct: <0.1 mg/dL (ref 0.0–0.2)
Total Bilirubin: 0.4 mg/dL (ref 0.0–1.2)
Total Protein: 6.5 g/dL (ref 6.5–8.1)

## 2023-05-25 LAB — CBC
HCT: 25.8 % — ABNORMAL LOW (ref 36.0–46.0)
Hemoglobin: 7.9 g/dL — ABNORMAL LOW (ref 12.0–15.0)
MCH: 27.7 pg (ref 26.0–34.0)
MCHC: 30.6 g/dL (ref 30.0–36.0)
MCV: 90.5 fL (ref 80.0–100.0)
Platelets: 258 10*3/uL (ref 150–400)
RBC: 2.85 MIL/uL — ABNORMAL LOW (ref 3.87–5.11)
RDW: 18.9 % — ABNORMAL HIGH (ref 11.5–15.5)
WBC: 9.9 10*3/uL (ref 4.0–10.5)
nRBC: 0 % (ref 0.0–0.2)

## 2023-05-25 LAB — TROPONIN I (HIGH SENSITIVITY)
Troponin I (High Sensitivity): 19 ng/L — ABNORMAL HIGH (ref <18)
Troponin I (High Sensitivity): 23 ng/L — ABNORMAL HIGH (ref <18)

## 2023-05-25 LAB — MAGNESIUM: Magnesium: 0.9 mg/dL — CL (ref 1.7–2.4)

## 2023-05-25 LAB — BASIC METABOLIC PANEL
Anion gap: 13 (ref 5–15)
BUN: 88 mg/dL — ABNORMAL HIGH (ref 8–23)
CO2: 15 mmol/L — ABNORMAL LOW (ref 22–32)
Calcium: 8 mg/dL — ABNORMAL LOW (ref 8.9–10.3)
Chloride: 112 mmol/L — ABNORMAL HIGH (ref 98–111)
Creatinine, Ser: 4.36 mg/dL — ABNORMAL HIGH (ref 0.44–1.00)
GFR, Estimated: 10 mL/min — ABNORMAL LOW (ref >60)
Glucose, Bld: 94 mg/dL (ref 70–99)
Potassium: 3.9 mmol/L (ref 3.5–5.1)
Sodium: 140 mmol/L (ref 135–145)

## 2023-05-25 LAB — BRAIN NATRIURETIC PEPTIDE: B Natriuretic Peptide: 336.3 pg/mL — ABNORMAL HIGH (ref 0.0–100.0)

## 2023-05-25 MED ORDER — FUROSEMIDE 10 MG/ML IJ SOLN
40.0000 mg | Freq: Every day | INTRAMUSCULAR | Status: AC
  Administered 2023-05-26: 40 mg via INTRAVENOUS
  Filled 2023-05-25: qty 4

## 2023-05-25 MED ORDER — SODIUM CHLORIDE 0.9% FLUSH
3.0000 mL | Freq: Two times a day (BID) | INTRAVENOUS | Status: DC
  Administered 2023-05-25 – 2023-06-01 (×13): 3 mL via INTRAVENOUS

## 2023-05-25 MED ORDER — NITROGLYCERIN 0.4 MG SL SUBL: 0.4000 mg | SUBLINGUAL_TABLET | SUBLINGUAL | Status: DC | PRN

## 2023-05-25 MED ORDER — PANCRELIPASE (LIP-PROT-AMYL) 12000-38000 UNITS PO CPEP
36000.0000 [IU] | ORAL_CAPSULE | Freq: Three times a day (TID) | ORAL | Status: DC
  Administered 2023-05-25 – 2023-06-01 (×18): 36000 [IU] via ORAL
  Filled 2023-05-25: qty 3
  Filled 2023-05-25: qty 1
  Filled 2023-05-25 (×5): qty 3
  Filled 2023-05-25: qty 1
  Filled 2023-05-25 (×12): qty 3

## 2023-05-25 MED ORDER — PANTOPRAZOLE SODIUM 40 MG IV SOLR
40.0000 mg | Freq: Two times a day (BID) | INTRAVENOUS | Status: DC
  Administered 2023-05-25 – 2023-05-26 (×3): 40 mg via INTRAVENOUS
  Filled 2023-05-25 (×3): qty 10

## 2023-05-25 MED ORDER — FUROSEMIDE 10 MG/ML IJ SOLN
40.0000 mg | Freq: Once | INTRAMUSCULAR | Status: AC
  Administered 2023-05-25: 40 mg via INTRAVENOUS
  Filled 2023-05-25: qty 4

## 2023-05-25 MED ORDER — SODIUM CHLORIDE 0.9 % IV SOLN
1.0000 mg | Freq: Once | INTRAVENOUS | Status: AC
  Administered 2023-05-25: 1 mg via INTRAVENOUS
  Filled 2023-05-25: qty 0.2

## 2023-05-25 MED ORDER — FLUOXETINE HCL 20 MG PO CAPS
20.0000 mg | ORAL_CAPSULE | Freq: Every day | ORAL | Status: DC
  Administered 2023-05-26 – 2023-06-01 (×6): 20 mg via ORAL
  Filled 2023-05-25 (×6): qty 1

## 2023-05-25 MED ORDER — APIXABAN 2.5 MG PO TABS
2.5000 mg | ORAL_TABLET | Freq: Two times a day (BID) | ORAL | Status: DC
  Administered 2023-05-25 – 2023-05-27 (×4): 2.5 mg via ORAL
  Filled 2023-05-25 (×4): qty 1

## 2023-05-25 MED ORDER — HYDRALAZINE HCL 20 MG/ML IJ SOLN
5.0000 mg | INTRAMUSCULAR | Status: DC | PRN
  Administered 2023-05-28 – 2023-05-30 (×3): 5 mg via INTRAVENOUS
  Filled 2023-05-25 (×3): qty 1

## 2023-05-25 MED ORDER — MIRTAZAPINE 15 MG PO TABS
7.5000 mg | ORAL_TABLET | Freq: Every day | ORAL | Status: DC
  Administered 2023-05-25 – 2023-05-31 (×7): 7.5 mg via ORAL
  Filled 2023-05-25 (×7): qty 1

## 2023-05-25 MED ORDER — MAGNESIUM SULFATE 4 GM/100ML IV SOLN
4.0000 g | Freq: Once | INTRAVENOUS | Status: AC
  Administered 2023-05-26: 4 g via INTRAVENOUS
  Filled 2023-05-25: qty 100

## 2023-05-25 MED ORDER — RIVASTIGMINE TARTRATE 1.5 MG PO CAPS
1.5000 mg | ORAL_CAPSULE | Freq: Two times a day (BID) | ORAL | Status: DC
  Administered 2023-05-25 – 2023-05-31 (×12): 1.5 mg via ORAL
  Filled 2023-05-25 (×16): qty 1

## 2023-05-25 NOTE — Assessment & Plan Note (Signed)
 Soft diet / cardiac / carb consistent diet.

## 2023-05-25 NOTE — Progress Notes (Signed)
 Date and time results received: 05/25/23 2330 (use smartphrase ".now" to insert current time)  Test: Mg Critical Value: 0.9  Name of Provider Notified: [place]  Orders Received? Or Actions Taken?:

## 2023-05-25 NOTE — ED Provider Notes (Signed)
 [place] EMERGENCY DEPARTMENT AT [facility] Provider Note   CSN: [id number] Arrival date & time: 05/25/23  1156     History  No chief complaint on file.   [name] is a 78 y.o. female.  78 year old female presents with her daughter for concern of volume overload.  They are concerned that she has gained 11 pounds in 1 week.  Bilateral lower extremity edema.  Took an extra dose of Lasix last night but did not have any urine output.  Reported some chest pain in triage.  Denies it currently.  According to daughter patient does have history of dementia and occasionally is not accurate with her details.  The history is provided by the patient. No language interpreter was used.       Home Medications Prior to Admission medications   Medication Sig Start Date End Date Taking? Authorizing Provider  acetaminophen (TYLENOL) 500 MG tablet Take 1 tablet (500 mg total) by mouth every 6 (six) hours as needed. Patient taking differently: Take 1,000 mg by mouth every 6 (six) hours as needed for mild pain (pain score 1-3), headache or fever. 05/11/22   Derwood Kaplan, MD  amLODipine (NORVASC) 10 MG tablet Take 1 tablet (10 mg total) by mouth daily. 10/26/22     apixaban (ELIQUIS) 2.5 MG TABS tablet Take 1 tablet (2.5 mg total) by mouth 2 (two) times daily. 10/30/21   Arrien, York Ram, MD  cephALEXin (KEFLEX) 500 MG capsule Take 1 capsule (500 mg total) by mouth daily for 14 days. 05/17/23 05/31/23  Caron Presume, MD  colestipol (COLESTID) 1 g tablet Take 1 tablet (1 g total) by mouth 3 (three) times daily. 05/14/23   Deanna Artis, DO  diphenoxylate-atropine (LOMOTIL) 2.5-0.025 MG tablet Take 1 tablet by mouth 2 (two) times daily as needed for diarrhea or loose stools. 05/14/23 05/13/24  Deanna Artis, DO  doxycycline (VIBRAMYCIN) 100 MG capsule Take 1 capsule (100 mg total) by mouth 2 (two) times daily for 10 days. 05/17/23 05/27/23  Caron Presume, MD  famotidine  (PEPCID) 20 MG tablet Take 20 mg by mouth daily as needed for heartburn or indigestion. 10/14/21   [provider]  FLUoxetine (PROZAC) 20 MG capsule Take 20 mg by mouth daily. 05/05/23   [provider]  folic acid (FOLVITE) 1 MG tablet Take 1 tablet (1 mg total) by mouth daily. 05/14/23 05/13/24  Deanna Artis, DO  furosemide (LASIX) 40 MG tablet Take 1 tablet (40 mg total) by mouth daily. 04/09/23     gabapentin (NEURONTIN) 300 MG capsule Take 300 mg by mouth at bedtime. 05/02/23   [provider]  HYDROcodone-acetaminophen (NORCO/VICODIN) 5-325 MG tablet Take 2 tablets by mouth every 6 (six) hours as needed. 05/17/23   Caron Presume, MD  lipase/protease/amylase (CREON) 36000 UNITS CPEP capsule Take 1 capsule (36,000 Units total) by mouth 3 (three) times daily before meals. 05/14/23   Deanna Artis, DO  loperamide (IMODIUM) 2 MG capsule Take 1 capsule (2 mg total) by mouth as needed for diarrhea or loose stools. 05/14/23   Deanna Artis, DO  mirtazapine (REMERON) 7.5 MG tablet Take 7.5 mg by mouth at bedtime. 04/22/23   [provider]  nitroGLYCERIN (NITROSTAT) 0.4 MG SL tablet DISSOLVE 1 TABLET UNDER THE TONGUE EVERY 5 MINUTES AS  NEEDED FOR CHEST PAIN. MAX  OF 3 TABLETS IN 15 MINUTES. CALL 911 IF PAIN PERSISTS. 10/04/20   Jake Bathe, MD  pantoprazole (PROTONIX) 40 MG tablet Take 1 tablet (40 mg total) by mouth 2 (two) times daily. 12/23/21   Jacalyn Lefevre, MD  rivastigmine (EXELON) 1.5 MG capsule Take 1 capsule (1.5 mg total) by mouth 2 (two) times daily. 12/27/22 07/25/23  Windell Norfolk, MD  tiZANidine (ZANAFLEX) 2 MG tablet Take 2 mg by mouth at bedtime as needed. 04/09/23   [provider]      Allergies    Anesthetics, halogenated; Etomidate; Fentanyl; Percocet [oxycodone-acetaminophen]; Diazepam; Lorazepam; Tramadol hcl; Haloperidol; Morphine sulfate; Propoxyphene; and Versed [midazolam]    Review of Systems   Review of Systems   Constitutional:  Negative for chills and fever.  Respiratory:  Positive for shortness of breath.   Cardiovascular:  Positive for chest pain and leg swelling. Negative for palpitations.  Neurological:  Negative for light-headedness.  All other systems reviewed and are negative.   Physical Exam Updated Vital Signs BP (!) 153/76   Pulse 86   Temp 98.3 F (36.8 C)   Resp 14   Ht 5\' 4"  (1.626 m)   Wt 71.1 kg   SpO2 100%   BMI 26.91 kg/m  Physical Exam Vitals and nursing note reviewed.  Constitutional:      General: She is not in acute distress.    Appearance: Normal appearance. She is not ill-appearing.  HENT:     Head: Normocephalic and atraumatic.     Nose: Nose normal.  Eyes:     General: No scleral icterus.    Extraocular Movements: Extraocular movements intact.     Conjunctiva/sclera: Conjunctivae normal.  Cardiovascular:     Rate and Rhythm: Normal rate and regular rhythm.  Pulmonary:     Effort: Pulmonary effort is normal. No respiratory distress.     Breath sounds: Normal breath sounds. No wheezing or rales.  Abdominal:     General: There is no distension.     Tenderness: There is no abdominal tenderness. There is no guarding.  Musculoskeletal:        General: Normal range of motion.     Cervical back: Normal range of motion.     Right lower leg: Edema present.     Left lower leg: Edema present.  Skin:    General: Skin is warm and dry.  Neurological:     General: No focal deficit present.     Mental Status: She is alert. Mental status is at baseline.     ED Results / Procedures / Treatments   Labs (all labs ordered are listed, but only abnormal results are displayed) Labs Reviewed  BASIC METABOLIC PANEL - Abnormal; Notable for the following components:      Result Value   Chloride 112 (*)    CO2 15 (*)    BUN 88 (*)    Creatinine, Ser 4.36 (*)    Calcium 8.0 (*)    GFR, Estimated 10 (*)    All other components within normal limits  CBC - Abnormal;  Notable for the following components:   RBC 2.85 (*)    Hemoglobin 7.9 (*)    HCT 25.8 (*)    RDW 18.9 (*)    All other components within normal limits  BRAIN NATRIURETIC PEPTIDE - Abnormal; Notable for the following components:   B Natriuretic Peptide 336.3 (*)    All other components within normal limits  TROPONIN I (HIGH SENSITIVITY) - Abnormal; Notable for the following components:   Troponin I (High Sensitivity) 19 (*)    All other components within  normal limits  TROPONIN I (HIGH SENSITIVITY)    EKG EKG Interpretation Date/Time:  Saturday May 25 2023 12:53:47 EST Ventricular Rate:  89 PR Interval:  138 QRS Duration:  143 QT Interval:  390 QTC Calculation: 475 R Axis:   -1  Text Interpretation: Sinus rhythm Ventricular bigeminy Right bundle branch block PVCs otherwise no significant change Confirmed by Elayne Snare ([id number]) on 05/25/2023 2:12:32 PM  Radiology DG Chest 2 View Result Date: 05/25/2023 CLINICAL DATA:  Chest pain EXAM: CHEST - 2 VIEW COMPARISON:  X-ray 05/05/2023 and older FINDINGS: Stable cardiopericardial silhouette. No consolidation, pneumothorax or effusion. No edema. Curvature and degenerative changes of the spine. Surgical changes in the right axillary region and right upper abdomen, lower chest. Coronary stent. IMPRESSION: Chronic changes.  No acute cardiopulmonary disease. Electronically Signed   By: Karen Kays M.D.   On: 05/25/2023 13:01    Procedures Procedures    Medications Ordered in ED Medications  furosemide (LASIX) injection 40 mg (has no administration in time range)    ED Course/ Medical Decision Making/ A&P                                 Medical Decision Making Amount and/or Complexity of Data Reviewed Labs: ordered. Radiology: ordered.  Risk Prescription drug management.   Medical Decision Making / ED Course   This patient presents to the ED for concern of volume overload, shortness of breath, this involves an  extensive number of treatment options, and is a complaint that carries with it a high risk of complications and morbidity.  The differential diagnosis includes ACS, PE, pneumonia, viral URI  MDM: 79 year old female presents today for concern of shortness of breath and peripheral edema.  Has history of CHF.  Reports compliance with home medications.  Admission considered but will reevaluate after labs and imaging.  CBC without leukocytosis.  Hemoglobin 7.9 which is likely chronic with a component of dilution due to volume overload.  BMP with creatinine of 4.36 which has slight uptrend compared to recent.  BNP of 336.  Troponin of 19.  Troponin is likely elevated secondary due to demand.  Will continue to trend.  Chest x-ray without acute cardiopulmonary process.  No signs of volume overload.  EKG without acute ischemic change.  Dose of Lasix ordered.  Given uptrend in creatinine, patient's age she would likely benefit from admission for diuresis with close observation of her creatinine.  Discussed with hospitalist who agrees and will admit patient.    Lab Tests: -I ordered, reviewed, and interpreted labs.   The pertinent results include:   Labs Reviewed  BASIC METABOLIC PANEL - Abnormal; Notable for the following components:      Result Value   Chloride 112 (*)    CO2 15 (*)    BUN 88 (*)    Creatinine, Ser 4.36 (*)    Calcium 8.0 (*)    GFR, Estimated 10 (*)    All other components within normal limits  CBC - Abnormal; Notable for the following components:   RBC 2.85 (*)    Hemoglobin 7.9 (*)    HCT 25.8 (*)    RDW 18.9 (*)    All other components within normal limits  BRAIN NATRIURETIC PEPTIDE - Abnormal; Notable for the following components:   B Natriuretic Peptide 336.3 (*)    All other components within normal limits  TROPONIN I (HIGH SENSITIVITY) - Abnormal; Notable for  the following components:   Troponin I (High Sensitivity) 19 (*)    All other components within normal  limits  TROPONIN I (HIGH SENSITIVITY)      EKG  EKG Interpretation Date/Time:  Saturday May 25 2023 12:53:47 EST Ventricular Rate:  89 PR Interval:  138 QRS Duration:  143 QT Interval:  390 QTC Calculation: 475 R Axis:   -1  Text Interpretation: Sinus rhythm Ventricular bigeminy Right bundle branch block PVCs otherwise no significant change Confirmed by Elayne Snare ([id number]) on 05/25/2023 2:12:32 PM         Imaging Studies ordered: I ordered imaging studies including chest x-ray I independently visualized and interpreted imaging. I agree with the radiologist interpretation   Medicines ordered and prescription drug management: Meds ordered this encounter  Medications   furosemide (LASIX) injection 40 mg    -I have reviewed the patients home medicines and have made adjustments as needed  Cardiac Monitoring: The patient was maintained on a cardiac monitor.  I personally viewed and interpreted the cardiac monitored which showed an underlying rhythm of: Normal sinus rhythm   Reevaluation: After the interventions noted above, I reevaluated the patient and found that they have :stayed the same  Co morbidities that complicate the patient evaluation  Past Medical History:  Diagnosis Date   Abdominal discomfort    Chronic N/V/D. Presumptive dx Crohn's dx per elevated p ANCA. Failed Entocort and Pentasa. Sep 2003 - ileocolectomy c anastomosis per Dr Orson Slick 2/2 adhesions - path was hegative for Crohns. EGD, Sm bowel follow through (11/03), and an eteroclysis (10/03) were unrevealing. Cuases hypomag and hypocalcemia.   Adnexal mass 8/03   s/p lap BSO (R ovarian fibroma) & lysis of adhesions   Allergy    Seasonal   Anemia    Multifactorial. Baseline HgB 10-11 ish. B12 def - 150 in 3/10. Fe Def - ferritin 35 3/10. Both are being repleted.   Breast cancer (HCC) 03/16/13   right, 5 o'clock   CAD (coronary artery disease) 1996   1996 - PTCA and angioplasty diagonal branch.  2000 - Rotoblator & angiopllasty of diagonal. 2006 - subendocardial AMI, DES to proximal LAD.Marland Kitchen Also had 90% stenosis in distal apical LAD. EF 55 with apical hypokinesis. Indefinite ASA and Plavix.   CHF (congestive heart failure) (HCC)    Chronic kidney disease    Chronic renal insuff baseline Cr 1.2 - 1.4 ish.   Chronic kidney disease, stage III (moderate) (HCC)    Chronic renal insuff baseline Cr 1.2 - 1.4 ish. New baseline about 2 (2018)   Chronic pain    CT 10/10 = Spinal stenosis L2 - S1.   COVID-19 11/02/2020   Diabetes mellitus    Insulin dependent   Diabetes mellitus type 2 in obese 05/03/2010   Managed on lantus and novolog. Has diabetic nephropathy. Metformin D/C'd 2012 2/2 creatinine.  No diabetic retinopathy per 3/11.    Gout    Hx of radiation therapy 06/02/13- 07/16/13   right rbeast 4500 cGy 25 sessions, right breast boost 1600 cGy in 8 sessions   Hyperlipidemia    Managed with both a statin and Welchol. Welchol stopped 2014 2/2 cost and started on fenofibrate    Hypertension    2006 B renal arteries patent. 2003 MRA - no RAS. 2003 pheo W/U Dr Caryn Section reportedly negative.   Hypoxia 07/23/2017   Lupus    Lymphedema of breast    Personal history of radiation therapy 2015   Pneumonia of left lung  due to infectious organism    RBBB    Renal vein thrombosis (HCC)    SBO (small bowel obstruction) (HCC) 09/17/2017   Secondary hyperparathyroidism (HCC)    Stroke [facility])    Incidental finding MRI 2002 L lacunar infarct   Vitamin B12 deficiency    Vitamin D deficiency    Wears dentures    top      Dispostion: Discussed with hospitalist will evaluate patient for admission.   Final Clinical Impression(s) / ED Diagnoses Final diagnoses:  Acute on chronic congestive heart failure, unspecified heart failure type [facility])    Rx / DC Orders ED Discharge Orders     None         Marita Kansas, PA-C 05/25/23 1448    Elayne Snare K, DO 05/25/23 1552

## 2023-05-25 NOTE — Assessment & Plan Note (Signed)
 2/2 to CHF. Supplemental oxygen for any hypoxia or respiratory distress.

## 2023-05-25 NOTE — H&P (Signed)
 History and Physical    Patient: [name] ZOX:[id number] DOB: 1945-05-27 DOA: 05/25/2023 DOS: the patient was seen and examined on 05/25/2023 PCP: Renford Dills, MD  Patient coming from: Home Chief complaint: No chief complaint on file.  HPI:  [name] is a 78 y.o. 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  Daughter at bedside.  Patient does report dark black stools intermittently and no gross blood.  Patient does have a GI doctor has been doing chronic diarrhea for a while advised to contact GI for evaluation.  Patient otherwise does not have any complaints of chest pain palpitations does not report any headaches dizziness or falls. Chart review shows Upper endoscopy :- Normal esophagus. - 2 cm hiatal hernia. - Erythematous mucosa in the stomach.  ED Course: Vital signs in the ED were notable for the following:  Vitals:   05/25/23 1633 05/25/23 1754 05/25/23 1800 05/25/23 1934  BP: (!) 170/75  (!) 177/67 (!) 154/79  Pulse: 88  98 91  Temp: 98.1 F (36.7 C)  99.3 F (37.4 C) 98.2 F (36.8 C)  Resp: 16  15 16   Height:  5\' 4"  (1.626 m)    Weight:  73.4 kg    SpO2: 100%  100% 100%  TempSrc:   Oral Oral  BMI (Calculated):  27.76     Labs were notable for the following:  Metabolic panel showing chloride of 112 bicarb of 15 AKI with a creatinine of 4.36 and GFR of 10 BUN of 88 calcium 8.0. BNP of 336.3, troponin of 19. CBC shows anemia with a hemoglobin of 7.9 which is lower than previously 8.8 which again is lower than her previous 1 of 9.9 with a 2 to 3 g drop in the past few weeks. Folate of 5.3 EKG: sinus rhythm 89 PR 138 and ventricular bigeminy right bundle branch block QRS of  143 QTc of 475  Imaging and additional notable ED work-up: Chest x-ray clear with no consolidation or effusion.   While in the ED, the following were administered: Lasix 40 mg.   Review of Systems  Respiratory:  Positive for shortness of breath.   Cardiovascular:  Positive for leg swelling.   Past Medical History:  Diagnosis Date   Abdominal discomfort    Chronic N/V/D. Presumptive dx Crohn's dx per elevated p ANCA. Failed Entocort and Pentasa. Sep 2003 - ileocolectomy c anastomosis per Dr Orson Slick 2/2 adhesions - path was hegative for Crohns. EGD, Sm bowel follow through (11/03), and an eteroclysis (10/03) were unrevealing. Cuases hypomag and hypocalcemia.   Adnexal mass 8/03   s/p lap BSO (R ovarian fibroma) & lysis of adhesions   Allergy    Seasonal   Anemia    Multifactorial. Baseline HgB 10-11 ish. B12 def - 150 in 3/10. Fe Def - ferritin 35 3/10. Both are being repleted.   Breast cancer (HCC) 03/16/13   right, 5 o'clock   CAD (coronary artery disease) 1996   1996 - PTCA and angioplasty diagonal branch. 2000 - Rotoblator & angiopllasty of diagonal. 2006 - subendocardial AMI, DES to proximal LAD.Marland Kitchen Also had 90% stenosis in distal apical LAD. EF 55 with apical hypokinesis. Indefinite ASA and Plavix.   CHF (congestive heart failure) (HCC)  Chronic kidney disease    Chronic renal insuff baseline Cr 1.2 - 1.4 ish.   Chronic kidney disease, stage III (moderate) (HCC)    Chronic renal insuff baseline Cr 1.2 - 1.4 ish. New baseline about 2 (2018)   Chronic pain    CT 10/10 = Spinal stenosis L2 - S1.   COVID-19 11/02/2020   Diabetes mellitus    Insulin dependent   Diabetes mellitus type 2 in obese 05/03/2010   Managed on lantus and novolog. Has diabetic nephropathy. Metformin D/C'd 2012 2/2 creatinine.  No diabetic retinopathy per 3/11.    Gout    Hx of radiation therapy 06/02/13- 07/16/13   right rbeast 4500 cGy 25 sessions, right breast boost 1600 cGy in 8 sessions   Hyperlipidemia     Managed with both a statin and Welchol. Welchol stopped 2014 2/2 cost and started on fenofibrate    Hypertension    2006 B renal arteries patent. 2003 MRA - no RAS. 2003 pheo W/U Dr Caryn Section reportedly negative.   Hypoxia 07/23/2017   Lupus    Lymphedema of breast    Personal history of radiation therapy 2015   Pneumonia of left lung due to infectious organism    RBBB    Renal vein thrombosis (HCC)    SBO (small bowel obstruction) (HCC) 09/17/2017   Secondary hyperparathyroidism (HCC)    Stroke (HCC)    Incidental finding MRI 2002 L lacunar infarct   Vitamin B12 deficiency    Vitamin D deficiency    Wears dentures    top   Past Surgical History:  Procedure Laterality Date   ABDOMINAL HYSTERECTOMY     BILATERAL SALPINGOOPHORECTOMY  8/03   Lap BSO (R ovarian fibroma) and adhesion lysis   BIOPSY  09/08/2020   Procedure: BIOPSY;  Surgeon: Bernette Redbird, MD;  Location: [facility];  Service: Endoscopy;;   BOWEL RESECTION  2003   ileocolectomy with anastomosis 2/2 adhesions   BREAST BIOPSY Right 2015   BREAST BIOPSY Right 07/2014   BREAST LUMPECTOMY Right 04/22/2013   BREAST LUMPECTOMY WITH NEEDLE LOCALIZATION AND AXILLARY SENTINEL LYMPH NODE BX Right 04/22/2013   Procedure: BREAST LUMPECTOMY WITH NEEDLE LOCALIZATION AND AXILLARY SENTINEL LYMPH NODE BX;  Surgeon: Almond Lint, MD;  Location: [facility];  Service: General;  Laterality: Right;   CARDIAC CATHETERIZATION     2 stents   CHOLECYSTECTOMY     COLONOSCOPY     ESOPHAGOGASTRODUODENOSCOPY N/A 09/08/2020   Procedure: ESOPHAGOGASTRODUODENOSCOPY (EGD);  Surgeon: Bernette Redbird, MD;  Location: [facility];  Service: Endoscopy;  Laterality: N/A;   HEMICOLECTOMY     R sided hemicolectomy   HERNIA REPAIR     Ventral hernia repair   PTCA  4/06    reports that she has never smoked. She has never used smokeless tobacco. She reports that she does not drink alcohol and does not use drugs.  Allergies  Allergen  Reactions   Anesthetics, Halogenated Other (See Comments)    Per patient's daughter, she can not tolerate anesthetics.    Etomidate Other (See Comments)    Heart stopped during anesthesia   Fentanyl Other (See Comments)    Heart stopped during anesthesia    Percocet [Oxycodone-Acetaminophen] Other (See Comments)    Headaches    Diazepam Other (See Comments)    Agitation   Lorazepam Nausea Only   Tramadol Hcl Nausea And Vomiting   Haloperidol Nausea And Vomiting    **Haldol**   Morphine Sulfate Nausea Only  agitation   Propoxyphene Nausea And Vomiting    **Darvocet** no longer available   Versed [Midazolam] Other (See Comments)    Heart stopped during anesthesia    Family History  Problem Relation Age of Onset   Breast cancer Mother 58   Pancreatic cancer Brother 51   Lung cancer Maternal Aunt    Breast cancer Maternal Aunt    Kidney failure Maternal Aunt    Prostate cancer Maternal Uncle    Heart attack Maternal Grandmother    Breast cancer Maternal Grandmother    Colon cancer Maternal Grandmother    Clotting disorder Maternal Grandmother    Diabetes Maternal Grandmother    Hypertension Daughter    Pancreatic cancer Other     Prior to Admission medications   Medication Sig Start Date End Date Taking? Authorizing Provider  acetaminophen (TYLENOL) 500 MG tablet Take 1 tablet (500 mg total) by mouth every 6 (six) hours as needed. Patient taking differently: Take 1,000 mg by mouth every 6 (six) hours as needed for mild pain (pain score 1-3), headache or fever. 05/11/22   Derwood Kaplan, MD  amLODipine (NORVASC) 10 MG tablet Take 1 tablet (10 mg total) by mouth daily. 10/26/22     apixaban (ELIQUIS) 2.5 MG TABS tablet Take 1 tablet (2.5 mg total) by mouth 2 (two) times daily. 10/30/21   Arrien, York Ram, MD  cephALEXin (KEFLEX) 500 MG capsule Take 1 capsule (500 mg total) by mouth daily for 14 days. 05/17/23 05/31/23  Caron Presume, MD  colestipol (COLESTID) 1 g  tablet Take 1 tablet (1 g total) by mouth 3 (three) times daily. 05/14/23   Deanna Artis, DO  diphenoxylate-atropine (LOMOTIL) 2.5-0.025 MG tablet Take 1 tablet by mouth 2 (two) times daily as needed for diarrhea or loose stools. 05/14/23 05/13/24  Deanna Artis, DO  doxycycline (VIBRAMYCIN) 100 MG capsule Take 1 capsule (100 mg total) by mouth 2 (two) times daily for 10 days. 05/17/23 05/27/23  Caron Presume, MD  famotidine (PEPCID) 20 MG tablet Take 20 mg by mouth daily as needed for heartburn or indigestion. 10/14/21   [provider]  FLUoxetine (PROZAC) 20 MG capsule Take 20 mg by mouth daily. 05/05/23   [provider]  folic acid (FOLVITE) 1 MG tablet Take 1 tablet (1 mg total) by mouth daily. 05/14/23 05/13/24  Deanna Artis, DO  furosemide (LASIX) 40 MG tablet Take 1 tablet (40 mg total) by mouth daily. 04/09/23     gabapentin (NEURONTIN) 300 MG capsule Take 300 mg by mouth at bedtime. 05/02/23   [provider]  HYDROcodone-acetaminophen (NORCO/VICODIN) 5-325 MG tablet Take 2 tablets by mouth every 6 (six) hours as needed. 05/17/23   Caron Presume, MD  lipase/protease/amylase (CREON) 36000 UNITS CPEP capsule Take 1 capsule (36,000 Units total) by mouth 3 (three) times daily before meals. 05/14/23   Deanna Artis, DO  loperamide (IMODIUM) 2 MG capsule Take 1 capsule (2 mg total) by mouth as needed for diarrhea or loose stools. 05/14/23   Deanna Artis, DO  mirtazapine (REMERON) 7.5 MG tablet Take 7.5 mg by mouth at bedtime. 04/22/23   [provider]  nitroGLYCERIN (NITROSTAT) 0.4 MG SL tablet DISSOLVE 1 TABLET UNDER THE TONGUE EVERY 5 MINUTES AS  NEEDED FOR CHEST PAIN. MAX  OF 3 TABLETS IN 15 MINUTES. CALL 911 IF PAIN PERSISTS. 10/04/20   Jake Bathe, MD  pantoprazole (PROTONIX) 40 MG tablet Take 1 tablet (40 mg total)  by mouth 2 (two) times daily. 12/23/21   Jacalyn Lefevre, MD  rivastigmine (EXELON) 1.5 MG capsule Take 1 capsule (1.5 mg total) by  mouth 2 (two) times daily. 12/27/22 07/25/23  Windell Norfolk, MD  tiZANidine (ZANAFLEX) 2 MG tablet Take 2 mg by mouth at bedtime as needed. 04/09/23   [provider]                                                                                   Vitals:   05/25/23 1633 05/25/23 1754 05/25/23 1800 05/25/23 1934  BP: (!) 170/75  (!) 177/67 (!) 154/79  Pulse: 88  98 91  Resp: 16  15 16   Temp: 98.1 F (36.7 C)  99.3 F (37.4 C) 98.2 F (36.8 C)  TempSrc:   Oral Oral  SpO2: 100%  100% 100%  Weight:  73.4 kg    Height:  5\' 4"  (1.626 m)     Physical Exam Vitals and nursing note reviewed.  Constitutional:      General: She is not in acute distress. HENT:     Head: Normocephalic and atraumatic.     Right Ear: Hearing normal.     Left Ear: Hearing normal.     Nose: Nose normal. No nasal deformity.     Mouth/Throat:     Lips: Pink.     Tongue: No lesions.     Pharynx: Oropharynx is clear.  Eyes:     General: Lids are normal.     Extraocular Movements: Extraocular movements intact.  Cardiovascular:     Rate and Rhythm: Normal rate and regular rhythm.     Heart sounds: Normal heart sounds.  Pulmonary:     Effort: Pulmonary effort is normal.     Breath sounds: Normal breath sounds.  Abdominal:     General: Bowel sounds are normal. There is no distension.     Palpations: Abdomen is soft. There is no mass.     Tenderness: There is no abdominal tenderness.  Musculoskeletal:     Right lower leg: Edema present.     Left lower leg: Edema present.  Skin:    General: Skin is warm.  Neurological:     General: No focal deficit present.     Mental Status: She is alert and oriented to person, place, and time.     Cranial Nerves: Cranial nerves 2-12 are intact.  Psychiatric:        Attention and Perception: Attention normal.        Mood and Affect: Mood normal.        Speech: Speech normal.        Behavior: Behavior normal. Behavior is cooperative.      Labs on Admission:  I have personally reviewed following labs and imaging studies  CBC: Recent Labs  Lab 05/25/23 1219  WBC 9.9  HGB 7.9*  HCT 25.8*  MCV 90.5  PLT 258   Basic Metabolic Panel: Recent Labs  Lab 05/25/23 1219  NA 140  K 3.9  CL 112*  CO2 15*  GLUCOSE 94  BUN 88*  CREATININE 4.36*  CALCIUM 8.0*   GFR: Estimated Creatinine Clearance: 10.9 mL/min (A) (by  C-G formula based on SCr of 4.36 mg/dL (H)). Liver Function Tests: Recent Labs  Lab 05/25/23 1535  AST 14*  ALT 11  ALKPHOS 91  BILITOT 0.4  PROT 6.5  ALBUMIN 2.5*   No results for input(s): "LIPASE", "AMYLASE" in the last 168 hours. No results for input(s): "AMMONIA" in the last 168 hours. Coagulation Profile: No results for input(s): "INR", "PROTIME" in the last 168 hours. Cardiac Enzymes: No results for input(s): "CKTOTAL", "CKMB", "CKMBINDEX", "TROPONINI" in the last 168 hours. BNP (last 3 results) No results for input(s): "PROBNP" in the last 8760 hours. HbA1C: No results for input(s): "HGBA1C" in the last 72 hours. CBG: No results for input(s): "GLUCAP" in the last 168 hours. Lipid Profile: No results for input(s): "CHOL", "HDL", "LDLCALC", "TRIG", "CHOLHDL", "LDLDIRECT" in the last 72 hours. Thyroid Function Tests: No results for input(s): "TSH", "T4TOTAL", "FREET4", "T3FREE", "THYROIDAB" in the last 72 hours. Anemia Panel: No results for input(s): "VITAMINB12", "FOLATE", "FERRITIN", "TIBC", "IRON", "RETICCTPCT" in the last 72 hours. Urine analysis:    Component Value Date/Time   COLORURINE STRAW (A) 12/15/2022 1720   APPEARANCEUR HAZY (A) 12/15/2022 1720   APPEARANCEUR Clear 08/22/2018 1620   LABSPEC 1.010 12/15/2022 1720   LABSPEC 1.020 03/03/2014 1341   PHURINE 5.0 12/15/2022 1720   GLUCOSEU NEGATIVE 12/15/2022 1720   GLUCOSEU Negative 03/03/2014 1341   HGBUR NEGATIVE 12/15/2022 1720   BILIRUBINUR NEGATIVE 12/15/2022 1720   BILIRUBINUR Negative 08/22/2018 1620   BILIRUBINUR Negative 03/03/2014  1341   KETONESUR NEGATIVE 12/15/2022 1720   PROTEINUR 100 (A) 12/15/2022 1720   UROBILINOGEN 0.2 03/03/2014 1341   NITRITE NEGATIVE 12/15/2022 1720   LEUKOCYTESUR NEGATIVE 12/15/2022 1720   LEUKOCYTESUR Negative 03/03/2014 1341    Radiological Exams on Admission: DG Chest 2 View Result Date: 05/25/2023 CLINICAL DATA:  Chest pain EXAM: CHEST - 2 VIEW COMPARISON:  X-ray 05/05/2023 and older FINDINGS: Stable cardiopericardial silhouette. No consolidation, pneumothorax or effusion. No edema. Curvature and degenerative changes of the spine. Surgical changes in the right axillary region and right upper abdomen, lower chest. Coronary stent. IMPRESSION: Chronic changes.  No acute cardiopulmonary disease. Electronically Signed   By: Karen Kays M.D.   On: 05/25/2023 13:01   Data Reviewed: Relevant notes from primary care and specialist visits, past discharge summaries as available in EHR, including Care Everywhere. Prior diagnostic testing as pertinent to current admission diagnoses, Updated medications and problem lists for reconciliation ED course, including vitals, labs, imaging, treatment and response to treatment,Triage notes, nursing and pharmacy notes and ED provider's notes Notable results as noted in HPI.Discussed case with EDMD/ ED APP/ or Specialty MD on call and as needed.  Assessment & Plan SOB (shortness of breath) 2/2 to CHF. Supplemental oxygen for any hypoxia or respiratory distress.   Acute on chronic heart failure with preserved ejection fraction (HFpEF) (HCC) Clinically patient appears euvolemic, except for bilateral lower extremity edema. Monitor strict I's & O's and daily weights. Repeat BMP in the morning. Check serum magnesium level.  Continue lasix 40 mg iv q12h.   2 d echo.  I/O last 3 completed shifts: In: 50.2 [IV Piggyback:50.2] Out: 600 [Urine:600] Total I/O In: -  Out: 275 [Urine:275]   Benign essential HTN Vitals:   05/25/23 1204 05/25/23 1253 05/25/23 1315  05/25/23 1430  BP: (!) 161/69 (!) 169/72 (!) 153/76 (!) 161/77   05/25/23 1515 05/25/23 1633 05/25/23 1800 05/25/23 1934  BP: (!) 154/77 (!) 170/75 (!) 177/67 (!) 154/79  Continue patient on Lasix  scheduled for 3 doses and in the interim hold amlodipine.  CAD (coronary artery disease) Stable we will cont Eliquis and statin.  Renal vein thrombosis (HCC) Cont eliquis.   Type 2 diabetes mellitus (HCC) Soft diet / cardiac / carb consistent diet.  Anemia    Latest Ref Rng & Units 05/25/2023   12:19 PM 05/17/2023   11:49 AM 05/14/2023    8:02 AM  CBC  WBC 4.0 - 10.5 K/uL 9.9  13.7  12.8   Hemoglobin 12.0 - 15.0 g/dL 7.9  8.8  9.1   Hematocrit 36.0 - 46.0 % 25.8  29.2  29.8   Platelets 150 - 400 K/uL 258  230  300   Discussed with patient about suspicion of GI bleed that is chronic also causing her diarrhea and the need for possible transfusion. Also the need for possible GI evaluation while inpatient.  If her hemoglobin drops or she is guaiac positive. Folate replaced iv x 1.  B12 wnl   Acute kidney injury superimposed on chronic kidney disease (HCC) Lab Results  Component Value Date   CREATININE 4.36 (H) 05/25/2023   CREATININE 3.94 (H) 05/17/2023   CREATININE 3.95 (H) 05/14/2023  Creatinine trend is rising. Requested nephrology consult. Cautious diuresis. Strict I/O.   DVT prophylaxis:  Eliquis. Consults:  Nephrology  Advance Care Planning:    Code Status: Full Code   Family Communication:  Daughter  Disposition Plan:  TBD. Severity of Illness: The appropriate patient status for this patient is INPATIENT. Inpatient status is judged to be reasonable and necessary in order to provide the required intensity of service to ensure the patient's safety. The patient's presenting symptoms, physical exam findings, and initial radiographic and laboratory data in the context of their chronic comorbidities is felt to place them at high risk for further clinical deterioration.  Furthermore, it is not anticipated that the patient will be medically stable for discharge from the hospital within 2 midnights of admission.   * I certify that at the point of admission it is my clinical judgment that the patient will require inpatient hospital care spanning beyond 2 midnights from the point of admission due to high intensity of service, high risk for further deterioration and high frequency of surveillance required.*  Author: Gertha Calkin, MD 05/25/2023 8:04 PM  For on call review [email or web address].   Unresulted Labs (From admission, onward)     Start     Ordered   05/26/23 0500  Magnesium  Tomorrow morning,   R        05/25/23 1959   05/25/23 2000  Magnesium  Add-on,   AD        05/25/23 1959   Unscheduled  Occult blood card to lab, stool  As needed,   R      05/25/23 1435            Orders Placed This Encounter  Procedures   DG Chest 2 View   Basic metabolic panel   CBC   Brain natriuretic peptide   Occult blood card to lab, stool   Hepatic function panel   Magnesium   Magnesium   Diet heart healthy/carb modified Room service appropriate? Yes; Fluid consistency: Thin; Fluid restriction: 1500 mL Fluid   Document Height and Actual Weight   Notify physician (specify)   Initiate Heart Failure Care Plan   Daily weights   Strict intake and output   In and Out Cath   Patient Education:   Apply Heart Failure  Care Plan   [facility] and [facility] only) Obtain REDS clips reading Every morning   Cardiac Monitoring Continuous x 24 hours Indications for use: Sub-acute heart failure   Maintain IV access   Vital signs   Notify physician (specify)   Mobility Protocol: No Restrictions RN to initiate protocols based on patient's level of care   Refer to Sidebar Report Refer to ICU, Med-Surg, Progressive, and Step-Down Mobility Protocol Sidebars   Initiate Adult Central Line Maintenance and Catheter Protocol for patients with central line (CVC, PICC, Port, Hemodialysis, Trialysis)    If patient diabetic or glucose greater than 140 notify physician for Sliding Scale Insulin Orders   Do not place and if present remove PureWick   Initiate Oral Care Protocol   Initiate Carrier Fluid Protocol   RN may order General Admission PRN Orders utilizing "General Admission PRN medications" (through manage orders) for the following patient needs: allergy symptoms (Claritin), cold sores (Carmex), cough (Robitussin DM), eye irritation (Liquifilm Tears), hemorrhoids (Tucks), indigestion (Maalox), minor skin irritation (Hydrocortisone Cream), muscle pain (Ben Gay), nose irritation (saline nasal spray) and sore throat (Chloraseptic spray).   Apply Diabetes Mellitus Care Plan   STAT CBG when hypoglycemia is suspected. If treated, recheck every 15 minutes after each treatment until CBG >/= 70 mg/dl   Refer to Hypoglycemia Protocol Sidebar Report for treatment of CBG < 70 mg/dl   Full code   Consult to hospitalist   Inpatient consult to Cardiology Consult Timeframe: ROUTINE - requires response within 24 hours; Reason for Consult? CHF Already called   Consult to Transition of Care Team   Consult to Heart Failure Navigation Team [facility], [facility], and [facility])   Nutritional services consult   Consult to nephrology Consult Timeframe: ROUTINE - requires response within 24 hours; Reason for Consult? AKI  on CKD>   OT eval and treat   PT eval and treat   Pulse oximetry check with vital signs   Oxygen therapy Mode or (Route): Nasal cannula; Liters Per Minute: 2; Keep O2 saturation between: greater than 92 %   Incentive spirometry   ED EKG   EKG 12-Lead   EKG 12-Lead   ECHOCARDIOGRAM COMPLETE   Type and screen   Insert peripheral IV   Admit to Inpatient (patient's expected length of stay will be greater than 2 midnights or inpatient only procedure)   Aspiration precautions   Fall precautions

## 2023-05-25 NOTE — Assessment & Plan Note (Signed)
 Cont eliquis

## 2023-05-25 NOTE — Consult Note (Addendum)
 Renal Service Consult Note [facility]  Glenn Swaziland Crandell 05/25/2023 Maree Krabbe, MD Requesting Physician: Dr. Renaldo Reel  Reason for Consult: Renal failure  HPI: The patient is a 78 y.o. year-old w/ PMH as below who presented to the emergency room earlier today.  Her daughter states that she has gained 11 pounds in 1 week and has significant swelling in her legs.  Also complains of shortness of breath.  Patient was recently discharged after having a diarrhea illness.  The ED labs showed creatinine 4.3 BUN 88 calcium 8.0.  Hemoglobin was 7.9.  X-ray was unremarkable.  The patient received IV Lasix 40 mg and was admitted to the hospital.  We are asked to see for renal failure.  Pt seen in room.  Pt is followed by Dr. [facility].  She has been admitted here many times previously with AKI on CKD.  Patient has no complaints at this time, no shortness of breath and no chest pain.  ROS - denies CP, no joint pain, no HA, no blurry vision, no rash, no diarrhea, no nausea/ vomiting  PMH Anemia History of breast cancer CAD status post PCI CKD 4 Diabetes mellitus type 2 Gout  HL HTN History of radiation therapy History of stroke  Past Medical History  Past Medical History:  Diagnosis Date   Abdominal discomfort    Chronic N/V/D. Presumptive dx Crohn's dx per elevated p ANCA. Failed Entocort and Pentasa. Sep 2003 - ileocolectomy c anastomosis per Dr Orson Slick 2/2 adhesions - path was hegative for Crohns. EGD, Sm bowel follow through (11/03), and an eteroclysis (10/03) were unrevealing. Cuases hypomag and hypocalcemia.   Adnexal mass 8/03   s/p lap BSO (R ovarian fibroma) & lysis of adhesions   Allergy    Seasonal   Anemia    Multifactorial. Baseline HgB 10-11 ish. B12 def - 150 in 3/10. Fe Def - ferritin 35 3/10. Both are being repleted.   Breast cancer (HCC) 03/16/13   right, 5 o'clock   CAD (coronary artery disease) 1996   1996 - PTCA and  angioplasty diagonal branch. 2000 - Rotoblator & angiopllasty of diagonal. 2006 - subendocardial AMI, DES to proximal LAD.Marland Kitchen Also had 90% stenosis in distal apical LAD. EF 55 with apical hypokinesis. Indefinite ASA and Plavix.   CHF (congestive heart failure) (HCC)    Chronic kidney disease    Chronic renal insuff baseline Cr 1.2 - 1.4 ish.   Chronic kidney disease, stage III (moderate) (HCC)    Chronic renal insuff baseline Cr 1.2 - 1.4 ish. New baseline about 2 (2018)   Chronic pain    CT 10/10 = Spinal stenosis L2 - S1.   COVID-19 11/02/2020   Diabetes mellitus    Insulin dependent   Diabetes mellitus type 2 in obese 05/03/2010   Managed on lantus and novolog. Has diabetic nephropathy. Metformin D/C'd 2012 2/2 creatinine.  No diabetic retinopathy per 3/11.    Gout    Hx of radiation therapy 06/02/13- 07/16/13   right rbeast 4500 cGy 25 sessions, right breast boost 1600 cGy in 8 sessions   Hyperlipidemia    Managed with both a statin and Welchol. Welchol stopped 2014 2/2 cost and started on fenofibrate    Hypertension    2006 B renal arteries patent. 2003 MRA - no RAS. 2003 pheo W/U Dr Caryn Section reportedly negative.   Hypoxia 07/23/2017   Lupus    Lymphedema of breast    Personal history of radiation therapy  2015   Pneumonia of left lung due to infectious organism    RBBB    Renal vein thrombosis (HCC)    SBO (small bowel obstruction) (HCC) 09/17/2017   Secondary hyperparathyroidism (HCC)    Stroke [facility])    Incidental finding MRI 2002 L lacunar infarct   Vitamin B12 deficiency    Vitamin D deficiency    Wears dentures    top   Past Surgical History  Past Surgical History:  Procedure Laterality Date   ABDOMINAL HYSTERECTOMY     BILATERAL SALPINGOOPHORECTOMY  8/03   Lap BSO (R ovarian fibroma) and adhesion lysis   BIOPSY  09/08/2020   Procedure: BIOPSY;  Surgeon: Bernette Redbird, MD;  Location: [facility];  Service: Endoscopy;;   BOWEL RESECTION  2003   ileocolectomy with  anastomosis 2/2 adhesions   BREAST BIOPSY Right 2015   BREAST BIOPSY Right 07/2014   BREAST LUMPECTOMY Right 04/22/2013   BREAST LUMPECTOMY WITH NEEDLE LOCALIZATION AND AXILLARY SENTINEL LYMPH NODE BX Right 04/22/2013   Procedure: BREAST LUMPECTOMY WITH NEEDLE LOCALIZATION AND AXILLARY SENTINEL LYMPH NODE BX;  Surgeon: Almond Lint, MD;  Location: [facility];  Service: General;  Laterality: Right;   CARDIAC CATHETERIZATION     2 stents   CHOLECYSTECTOMY     COLONOSCOPY     ESOPHAGOGASTRODUODENOSCOPY N/A 09/08/2020   Procedure: ESOPHAGOGASTRODUODENOSCOPY (EGD);  Surgeon: Bernette Redbird, MD;  Location: [facility];  Service: Endoscopy;  Laterality: N/A;   HEMICOLECTOMY     R sided hemicolectomy   HERNIA REPAIR     Ventral hernia repair   PTCA  4/06   Family History  Family History  Problem Relation Age of Onset   Breast cancer Mother 110   Pancreatic cancer Brother 39   Lung cancer Maternal Aunt    Breast cancer Maternal Aunt    Kidney failure Maternal Aunt    Prostate cancer Maternal Uncle    Heart attack Maternal Grandmother    Breast cancer Maternal Grandmother    Colon cancer Maternal Grandmother    Clotting disorder Maternal Grandmother    Diabetes Maternal Grandmother    Hypertension Daughter    Pancreatic cancer Other    Social History  reports that she has never smoked. She has never used smokeless tobacco. She reports that she does not drink alcohol and does not use drugs. Allergies  Allergies  Allergen Reactions   Anesthetics, Halogenated Other (See Comments)    Per patient's daughter, she can not tolerate anesthetics.    Etomidate Other (See Comments)    Heart stopped during anesthesia   Fentanyl Other (See Comments)    Heart stopped during anesthesia    Percocet [Oxycodone-Acetaminophen] Other (See Comments)    Headaches    Diazepam Other (See Comments)    Agitation   Lorazepam Nausea Only   Tramadol Hcl Nausea And Vomiting   Haloperidol  Nausea And Vomiting    **Haldol**   Morphine Sulfate Nausea Only    agitation   Propoxyphene Nausea And Vomiting    **Darvocet** no longer available   Versed [Midazolam] Other (See Comments)    Heart stopped during anesthesia   Home medications Prior to Admission medications   Medication Sig Start Date End Date Taking? Authorizing Provider  acetaminophen (TYLENOL) 650 MG CR tablet Take 1,300 mg by mouth every 8 (eight) hours as needed for pain.   Yes [provider]  amLODipine (NORVASC) 10 MG tablet Take 1 tablet (10 mg total) by mouth daily. 10/26/22  Yes   apixaban (ELIQUIS) 2.5 MG TABS tablet Take 1 tablet (2.5 mg total) by mouth 2 (two) times daily. 10/30/21  Yes Arrien, York Ram, MD  cephALEXin (KEFLEX) 500 MG capsule Take 1 capsule (500 mg total) by mouth daily for 14 days. 05/17/23 05/31/23 Yes Caron Presume, MD  colestipol (COLESTID) 1 g tablet Take 1 tablet (1 g total) by mouth 3 (three) times daily. 05/14/23  Yes Deanna Artis, DO  diclofenac Sodium (VOLTAREN) 1 % GEL Apply 1 Application topically 4 (four) times daily as needed (knee pain).   Yes [provider]  diphenoxylate-atropine (LOMOTIL) 2.5-0.025 MG tablet Take 1 tablet by mouth 2 (two) times daily as needed for diarrhea or loose stools. 05/14/23 05/13/24 Yes Deanna Artis, DO  doxycycline (VIBRAMYCIN) 100 MG capsule Take 1 capsule (100 mg total) by mouth 2 (two) times daily for 10 days. 05/17/23 05/27/23 Yes Caron Presume, MD  famotidine (PEPCID) 20 MG tablet Take 20 mg by mouth daily as needed for heartburn or indigestion. 10/14/21  Yes [provider]  FLUoxetine (PROZAC) 20 MG capsule Take 20 mg by mouth daily. 05/05/23  Yes [provider]  folic acid (FOLVITE) 1 MG tablet Take 1 tablet (1 mg total) by mouth daily. 05/14/23 05/13/24 Yes Deanna Artis, DO  furosemide (LASIX) 40 MG tablet Take 1 tablet (40 mg total) by mouth daily. 04/09/23  Yes   gabapentin (NEURONTIN) 300 MG  capsule Take 300 mg by mouth at bedtime. 05/02/23  Yes [provider]  HYDROcodone-acetaminophen (NORCO/VICODIN) 5-325 MG tablet Take 2 tablets by mouth every 6 (six) hours as needed. 05/17/23  Yes Caron Presume, MD  lipase/protease/amylase (CREON) 36000 UNITS CPEP capsule Take 1 capsule (36,000 Units total) by mouth 3 (three) times daily before meals. 05/14/23  Yes Deanna Artis, DO  loperamide (IMODIUM) 2 MG capsule Take 1 capsule (2 mg total) by mouth as needed for diarrhea or loose stools. 05/14/23  Yes Deanna Artis, DO  mirtazapine (REMERON) 7.5 MG tablet Take 7.5 mg by mouth at bedtime. 04/22/23  Yes [provider]  nitroGLYCERIN (NITROSTAT) 0.4 MG SL tablet DISSOLVE 1 TABLET UNDER THE TONGUE EVERY 5 MINUTES AS  NEEDED FOR CHEST PAIN. MAX  OF 3 TABLETS IN 15 MINUTES. CALL 911 IF PAIN PERSISTS. 10/04/20  Yes Jake Bathe, MD  pantoprazole (PROTONIX) 40 MG tablet Take 1 tablet (40 mg total) by mouth 2 (two) times daily. Patient taking differently: Take 40 mg by mouth daily. 12/23/21  Yes Jacalyn Lefevre, MD  rivastigmine (EXELON) 1.5 MG capsule Take 1 capsule (1.5 mg total) by mouth 2 (two) times daily. 12/27/22 07/25/23 Yes Camara, Amalia Hailey, MD  tiZANidine (ZANAFLEX) 2 MG tablet Take 2 mg by mouth at bedtime as needed. 04/09/23  Yes [provider]     Vitals:   05/25/23 1633 05/25/23 1754 05/25/23 1800 05/25/23 1934  BP: (!) 170/75  (!) 177/67 (!) 154/79  Pulse: 88  98 91  Resp: 16  15 16   Temp: 98.1 F (36.7 C)  99.3 F (37.4 C) 98.2 F (36.8 C)  TempSrc:   Oral Oral  SpO2: 100%  100% 100%  Weight:  73.4 kg    Height:  5\' 4"  (1.626 m)     Exam Gen alert, no distress No rash, cyanosis or gangrene Sclera anicteric, throat clear  No jvd or bruits Chest clear bilat to bases, no rales/ wheezing RRR no MRG Abd soft ntnd no mass or  ascites +bs GU defer MS no joint effusions or deformity Ext 2+ bilateral pretibial edema, no other edema Neuro is alert,  Ox 3 , nf     Renal-related home meds: - norvasc 10 - lasix 40 Mg daily - others: apixaban, colestipol, famotidine, fluoxetine, hydrocodone, mirtazapine, sl nitroglycerin, rivastigmine  Date   Creat  eGFR (ml/min) 2007- 2009   0.87-1.05 2010    0.78-1.90 2011-2013  1.03- 1.59 2015   0.95- 1.80 2016- 2017  1.11- 2.40 2018   1.40- 2.05 2019   1.08- 3.08 2020   1.74- 4.42 2021   1.61- 2.55 2022   1.78- 6.19 2023   2.17 - 5.35 8 --> 23 ml/min  2024   1.82- 3.60 1/28   4.14  11 ml/min 2/19- 05/17/23  5.98 >> 3.48 7 --> 13 ml/min 05/25/23  4.36   Na 140 K3.9 BUN 88 creatinine 4.3 albumin 2.5 BNP 336 hemoglobin 7.9 CXR - chronic changes, no acute cardiopulmonary disease   Assessment/ Plan: AKI on CKD 5 - b/l creat 3.4- 4.1 from jan - feb 2025, eGFR 11-13 ml/min. Creat here is 4.3, not that far from her baseline. She has sig LE edema, but CXR is clear and she is on RA. Rx is to diurese. Will follow.  Acute on chronic HF w/ preserved EF - lasix IV 40mg  q 12 hrs HTN - cont home norvasc, diuresing H/o renal vein thrombosis - on eliquis DM2 CAD       Vinson Moselle  MD [name] 05/25/2023, 8:57 PM  Recent Labs  Lab 05/25/23 1219 05/25/23 1535  HGB 7.9*  --   ALBUMIN  --  2.5*  CALCIUM 8.0*  --   CREATININE 4.36*  --   K 3.9  --    Inpatient medications:  apixaban  2.5 mg Oral BID   [START ON 05/26/2023] FLUoxetine  20 mg Oral Daily   [START ON 05/26/2023] furosemide  40 mg Intravenous Daily   lipase/protease/amylase  36,000 Units Oral TID AC   mirtazapine  7.5 mg Oral QHS   pantoprazole (PROTONIX) IV  40 mg Intravenous Q12H   rivastigmine  1.5 mg Oral BID   sodium chloride flush  3 mL Intravenous Q12H    hydrALAZINE, nitroGLYCERIN

## 2023-05-25 NOTE — Assessment & Plan Note (Signed)
    Latest Ref Rng & Units 05/25/2023   12:19 PM 05/17/2023   11:49 AM 05/14/2023    8:02 AM  CBC  WBC 4.0 - 10.5 K/uL 9.9  13.7  12.8   Hemoglobin 12.0 - 15.0 g/dL 7.9  8.8  9.1   Hematocrit 36.0 - 46.0 % 25.8  29.2  29.8   Platelets 150 - 400 K/uL 258  230  300   Discussed with patient about suspicion of GI bleed that is chronic also causing her diarrhea and the need for possible transfusion. Also the need for possible GI evaluation while inpatient.  If her hemoglobin drops or she is guaiac positive. Folate replaced iv x 1.  B12 wnl

## 2023-05-25 NOTE — Assessment & Plan Note (Addendum)
 Vitals:   05/25/23 1204 05/25/23 1253 05/25/23 1315 05/25/23 1430  BP: (!) 161/69 (!) 169/72 (!) 153/76 (!) 161/77   05/25/23 1515 05/25/23 1633 05/25/23 1800 05/25/23 1934  BP: (!) 154/77 (!) 170/75 (!) 177/67 (!) 154/79  Continue patient on Lasix scheduled for 3 doses and in the interim hold amlodipine.

## 2023-05-25 NOTE — ED Triage Notes (Signed)
 Pt's daughter states pt has gained 11 lbs in 1 wk. Pt has 3+ edema bilat. Pt c/o SOB

## 2023-05-25 NOTE — Assessment & Plan Note (Addendum)
 Clinically patient appears euvolemic, except for bilateral lower extremity edema. Monitor strict I's & O's and daily weights. Repeat BMP in the morning. Check serum magnesium level.  Continue lasix 40 mg iv q12h.   2 d echo.  I/O last 3 completed shifts: In: 50.2 [IV Piggyback:50.2] Out: 600 [Urine:600] Total I/O In: -  Out: 275 [Urine:275]

## 2023-05-25 NOTE — Assessment & Plan Note (Signed)
 Stable we will cont Eliquis and statin.

## 2023-05-25 NOTE — Assessment & Plan Note (Signed)
 Lab Results  Component Value Date   CREATININE 4.36 (H) 05/25/2023   CREATININE 3.94 (H) 05/17/2023   CREATININE 3.95 (H) 05/14/2023  Creatinine trend is rising. Requested nephrology consult. Cautious diuresis. Strict I/O.

## 2023-05-26 ENCOUNTER — Inpatient Hospital Stay (HOSPITAL_COMMUNITY)

## 2023-05-26 DIAGNOSIS — I5021 Acute systolic (congestive) heart failure: Secondary | ICD-10-CM

## 2023-05-26 DIAGNOSIS — R0602 Shortness of breath: Secondary | ICD-10-CM | POA: Diagnosis not present

## 2023-05-26 LAB — ECHOCARDIOGRAM COMPLETE
AR max vel: 2.36 cm2
AV Area VTI: 2.02 cm2
AV Area mean vel: 2.64 cm2
AV Mean grad: 3.1 mmHg
AV Peak grad: 7.1 mmHg
Ao pk vel: 1.33 m/s
Area-P 1/2: 3.53 cm2
Height: 64 in
S' Lateral: 2.7 cm
Weight: 2536.17 [oz_av]

## 2023-05-26 LAB — CBC
HCT: 27.2 % — ABNORMAL LOW (ref 36.0–46.0)
Hemoglobin: 8.4 g/dL — ABNORMAL LOW (ref 12.0–15.0)
MCH: 27.5 pg (ref 26.0–34.0)
MCHC: 30.9 g/dL (ref 30.0–36.0)
MCV: 88.9 fL (ref 80.0–100.0)
Platelets: 300 10*3/uL (ref 150–400)
RBC: 3.06 MIL/uL — ABNORMAL LOW (ref 3.87–5.11)
RDW: 18.8 % — ABNORMAL HIGH (ref 11.5–15.5)
WBC: 8.7 10*3/uL (ref 4.0–10.5)
nRBC: 0 % (ref 0.0–0.2)

## 2023-05-26 LAB — RENAL FUNCTION PANEL
Albumin: 2.4 g/dL — ABNORMAL LOW (ref 3.5–5.0)
Anion gap: 11 (ref 5–15)
BUN: 82 mg/dL — ABNORMAL HIGH (ref 8–23)
CO2: 17 mmol/L — ABNORMAL LOW (ref 22–32)
Calcium: 8.5 mg/dL — ABNORMAL LOW (ref 8.9–10.3)
Chloride: 112 mmol/L — ABNORMAL HIGH (ref 98–111)
Creatinine, Ser: 3.95 mg/dL — ABNORMAL HIGH (ref 0.44–1.00)
GFR, Estimated: 11 mL/min — ABNORMAL LOW (ref >60)
Glucose, Bld: 97 mg/dL (ref 70–99)
Phosphorus: 4.5 mg/dL (ref 2.5–4.6)
Potassium: 4.1 mmol/L (ref 3.5–5.1)
Sodium: 140 mmol/L (ref 135–145)

## 2023-05-26 LAB — URINALYSIS, ROUTINE W REFLEX MICROSCOPIC
Bilirubin Urine: NEGATIVE
Glucose, UA: NEGATIVE mg/dL
Ketones, ur: NEGATIVE mg/dL
Leukocytes,Ua: NEGATIVE
Nitrite: NEGATIVE
Protein, ur: 100 mg/dL — AB
Specific Gravity, Urine: 1.008 (ref 1.005–1.030)
pH: 5 (ref 5.0–8.0)

## 2023-05-26 LAB — CREATININE, URINE, RANDOM: Creatinine, Urine: 28 mg/dL

## 2023-05-26 LAB — OCCULT BLOOD X 1 CARD TO LAB, STOOL: Fecal Occult Bld: NEGATIVE

## 2023-05-26 LAB — SODIUM, URINE, RANDOM: Sodium, Ur: 116 mmol/L

## 2023-05-26 LAB — MAGNESIUM: Magnesium: 1.9 mg/dL (ref 1.7–2.4)

## 2023-05-26 MED ORDER — METOLAZONE 5 MG PO TABS
2.5000 mg | ORAL_TABLET | Freq: Every day | ORAL | Status: DC
  Administered 2023-05-26 – 2023-05-27 (×2): 2.5 mg via ORAL
  Filled 2023-05-26 (×2): qty 1

## 2023-05-26 MED ORDER — PANTOPRAZOLE SODIUM 40 MG PO TBEC
40.0000 mg | DELAYED_RELEASE_TABLET | Freq: Every day | ORAL | Status: DC
  Administered 2023-05-27 – 2023-06-01 (×5): 40 mg via ORAL
  Filled 2023-05-26 (×5): qty 1

## 2023-05-26 MED ORDER — FOLIC ACID 1 MG PO TABS
1.0000 mg | ORAL_TABLET | Freq: Every day | ORAL | Status: DC
  Administered 2023-05-26 – 2023-06-01 (×6): 1 mg via ORAL
  Filled 2023-05-26 (×6): qty 1

## 2023-05-26 MED ORDER — FUROSEMIDE 10 MG/ML IJ SOLN
80.0000 mg | Freq: Two times a day (BID) | INTRAMUSCULAR | Status: DC
  Administered 2023-05-26 – 2023-05-27 (×2): 80 mg via INTRAVENOUS
  Filled 2023-05-26 (×2): qty 8

## 2023-05-26 NOTE — Progress Notes (Signed)
  Echocardiogram 2D Echocardiogram has been performed.  Theotis Burrow 05/26/2023, 11:47 AM

## 2023-05-26 NOTE — Evaluation (Signed)
 Occupational Therapy Evaluation Patient Details Name: [name] MRN: [id number] DOB: May 19, 1945 Today's Date: 05/26/2023   History of Present Illness   Patient is a 78 yo female presenting to the ED with weight gain of 11 pounds in 1 week on 05/25/23. Admitted with SoB and AKI. Patient with multiple admissions PMH:  CKD IV, HTN, CHF, CAD, DVT, CVA, DM2, gout, chronic gastritis, breast CA, dementia, depression.     Clinical Impressions Prior to this admission, patient living with her family in a two story home, with full bath on the second level. Per chart review, patient has assistance with medication management and recall, but is typically able to climb the flight of stairs independently and complete ADLs. Currently, patient with edema in legs, and needing CGA to stand at EOB to take a few steps to the [facility]. Patient's breakfast arriving and patient politely refusing more mobility. Patient able to complete grooming ADLs at set up level, and is CGA for other ADLs. OT recommending HHOT currently, but may progress past needing. OT will follow acutely.     If plan is discharge home, recommend the following:   A little help with walking and/or transfers;A little help with bathing/dressing/bathroom;Direct supervision/assist for medications management;Direct supervision/assist for financial management;Help with stairs or ramp for entrance;Supervision due to cognitive status;Assist for transportation     Functional Status Assessment   Patient has had a recent decline in their functional status and demonstrates the ability to make significant improvements in function in a reasonable and predictable amount of time.     Equipment Recommendations   None recommended by OT     Recommendations for Other Services         Precautions/Restrictions   Precautions Precautions: Fall Recall of Precautions/Restrictions: Impaired Precaution/Restrictions Comments: memory impairment/ decreased  attention Restrictions Weight Bearing Restrictions Per Provider Order: No     Mobility Bed Mobility Overal bed mobility: Needs Assistance Bed Mobility: Sidelying to Sit   Sidelying to sit: Supervision       General bed mobility comments: minimal increased time    Transfers Overall transfer level: Needs assistance Equipment used: 1 person hand held assist Transfers: Sit to/from Stand Sit to Stand: Contact guard assist           General transfer comment: CGA for safety, able to take a few steps towards the [facility]      Balance Overall balance assessment: Needs assistance Sitting-balance support: Feet supported Sitting balance-Leahy Scale: Good     Standing balance support: During functional activity, Reliant on assistive device for balance Standing balance-Leahy Scale: Fair                             ADL either performed or assessed with clinical judgement   ADL Overall ADL's : Needs assistance/impaired Eating/Feeding: Set up;Sitting   Grooming: Wash/dry hands;Wash/dry face;Oral care;Set up;Sitting   Upper Body Bathing: Contact guard assist;Sitting   Lower Body Bathing: Minimal assistance;Sitting/lateral leans;Sit to/from stand   Upper Body Dressing : Contact guard assist;Sitting   Lower Body Dressing: Minimal assistance;Sitting/lateral leans;Sit to/from stand   Toilet Transfer: Minimal assistance;BSC/3in1;Stand-pivot Statistician Details (indicate cue type and reason): simulated Toileting- Clothing Manipulation and Hygiene: Contact guard assist;Sit to/from stand;Sitting/lateral lean       Functional mobility during ADLs: Contact guard assist;Cueing for safety;Cueing for sequencing General ADL Comments: Prior to this admission, patient living with her family in a two story home, with full bath on the  second level. Per chart review, patient has assistance with medication management and recall, but is typically able to climb the flight of stairs  independently and complete ADLs. Currently, patient with edema in legs, and needing CGA to stand at EOB to take a few steps to the [facility]. Patient's breakfast arriving and patient politely refusing more mobility. Patient able to complete grooming ADLs at set up level, and is CGA for other ADLs. OT recommending HHOT currently, but may progress past needing. OT will follow acutely.     Vision Baseline Vision/History: 0 No visual deficits Ability to See in Adequate Light: 0 Adequate Patient Visual Report: No change from baseline Vision Assessment?: No apparent visual deficits     Perception Perception: Not tested       Praxis Praxis: Not tested       Pertinent Vitals/Pain Pain Assessment Pain Assessment: No/denies pain     Extremity/Trunk Assessment Upper Extremity Assessment Upper Extremity Assessment: Generalized weakness;LUE deficits/detail LUE Deficits / Details: L shoulder weakness, patient states this is her baseline LUE Coordination: decreased gross motor   Lower Extremity Assessment Lower Extremity Assessment: Defer to PT evaluation   Cervical / Trunk Assessment Cervical / Trunk Assessment: Normal   Communication Communication Communication: No apparent difficulties   Cognition Arousal: Alert Behavior During Therapy: WFL for tasks assessed/performed Cognition: History of cognitive impairments             OT - Cognition Comments: Patient with dementia at baseline, minimally disoriented stating it was April, knew where she was and why she was in the hospital                 Following commands: Intact       Cueing  General Comments   Cueing Techniques: Verbal cues  VSS on RA   Exercises     Shoulder Instructions      Home Living Family/patient expects to be discharged to:: Private residence Living Arrangements: Other relatives (sister in law and niece) Available Help at Discharge: Family;Available PRN/intermittently Type of Home: House Home  Access: Level entry     Home Layout: Two level;1/2 bath on main level Alternate Level Stairs-Number of Steps: 12 Alternate Level Stairs-Rails: Right Bathroom Shower/Tub: Chief Strategy Officer: Standard Bathroom Accessibility: Yes   Home Equipment: Agricultural consultant (2 wheels);Rollator (4 wheels);Cane - single point   Additional Comments: information obtained from chart, patient with dementia at baseline      Prior Functioning/Environment Prior Level of Function : Independent/Modified Independent             Mobility Comments: independent, usually does not use AD, denies falls ADLs Comments: daughter assists with med mgmt and driving per chart    OT Problem List: Decreased strength;Impaired balance (sitting and/or standing);Decreased activity tolerance;Cardiopulmonary status limiting activity;Increased edema   OT Treatment/Interventions: Self-care/ADL training;Therapeutic exercise;Energy conservation;DME and/or AE instruction;Manual therapy;Therapeutic activities;Patient/family education;Balance training      OT Goals(Current goals can be found in the care plan section)   Acute Rehab OT Goals Patient Stated Goal: to get better OT Goal Formulation: With patient Time For Goal Achievement: 06/09/23 Potential to Achieve Goals: Good   OT Frequency:  Min 2X/week    Co-evaluation              AM-PAC OT "6 Clicks" Daily Activity     Outcome Measure Help from another person eating meals?: A Little Help from another person taking care of personal grooming?: A Little Help from another person toileting,  which includes using toliet, bedpan, or urinal?: A Little Help from another person bathing (including washing, rinsing, drying)?: A Little Help from another person to put on and taking off regular upper body clothing?: A Little Help from another person to put on and taking off regular lower body clothing?: A Little 6 Click Score: 18   End of Session Nurse  Communication: Mobility status  Activity Tolerance: Patient tolerated treatment well Patient left: in bed;with call bell/phone within reach;with bed alarm set (sitting EOB eating breakfast)  OT Visit Diagnosis: Unsteadiness on feet (R26.81);Muscle weakness (generalized) (M62.81);History of falling (Z91.81)                Time: 4098-1191 OT Time Calculation (min): 20 min Charges:  OT General Charges $OT Visit: 1 Visit OT Evaluation $OT Eval Moderate Complexity: 1 Mod  Pollyann Glen E. Xylah Early, OTR/L Acute Rehabilitation Services [phone]   Cherlyn Cushing 05/26/2023, 11:07 AM

## 2023-05-26 NOTE — Hospital Course (Addendum)
 78 year old female with history of HTN, CAD, CKD stage IV lower extremity DVT/renal artery thrombosis on Eliquis, chronic HFpEF, depression/memory loss, chronic diarrhea following up with GI presented to the ED with 11 pound weight gain in [redacted] week along with lower leg edema and shortness of breath despite taking extra Lasix dose. In the ED-hemodynamically stable, on room air afebrile- felt to have volume overload BNP 336 troponin 19, hemoglobin 7.9- chest x-ray without acute cardiopulmonary finding, patient was given Lasix, nephrology consulted and admitted for further management.  Patient was diuresed by nephrology with improvement in fluid overload. Seen by GI due to anemia concern for GI bleeding acute on chronic diarrhea EGD colonoscopy no acute finding okay to resume on Eliquis.  Patient has anemia of CKD iron saturation 30% Aranesp, will need outpatient follow-up with nephrology for iron has/iron-has missed previous appointments-nephrology or hemonc will arrange. Discussion approved this morning and okay for discharge home   Significant imaging/procedures: 3/13>EGD: nonobstructive lower esophageal ring, hiatal hernia and gastritis. Colonoscopy: fair prep, ileocolonic anastomosis in the right colon, small polyp which was removed with cold snare but not retrieved.  Normal mucosa.  Biopsies taken for colitis.  No evidence of active bleeding.  Brown stool in the colon> GI recommending no further workup, okay to resume Eliquis 3/14, and continue colestipol, Creon and signed off.  Consultation: Nephrology Gastroenterology Cardiology

## 2023-05-26 NOTE — Evaluation (Signed)
 Physical Therapy Evaluation Patient Details Name: [name] MRN: [id number] DOB: 1945/12/22 Today's Date: 05/26/2023  History of Present Illness  Patient is a 78 yo female presenting to the ED with weight gain of 11 pounds in 1 week on 05/25/23. Admitted with SoB and AKI. Patient with multiple admissions PMH:  CKD IV, HTN, CHF, CAD, DVT, CVA, DM2, gout, chronic gastritis, breast CA, dementia, depression.  Clinical Impression  Pt presents with admitting diagnosis above. Pt today was able to ambulate in hallway with RW CGA. PTA pt reports that she was independent with no AD however family member present reports that she is Mod I with rollator. Recommend HHPT upon DC. PT will continue to follow.         If plan is discharge home, recommend the following: Supervision due to cognitive status;Direct supervision/assist for financial management;Assistance with cooking/housework;Help with stairs or ramp for entrance;Direct supervision/assist for medications management;Assist for transportation   Can travel by private vehicle        Equipment Recommendations None recommended by PT  Recommendations for Other Services       Functional Status Assessment Patient has had a recent decline in their functional status and demonstrates the ability to make significant improvements in function in a reasonable and predictable amount of time.     Precautions / Restrictions Precautions Precautions: Fall Recall of Precautions/Restrictions: Impaired Precaution/Restrictions Comments: memory impairment/ decreased attention Restrictions Weight Bearing Restrictions Per Provider Order: No      Mobility  Bed Mobility Overal bed mobility: Needs Assistance Bed Mobility: Sidelying to Sit   Sidelying to sit: Supervision       General bed mobility comments: minimal increased time    Transfers Overall transfer level: Needs assistance Equipment used: Rolling walker (2 wheels) Transfers: Sit to/from  Stand Sit to Stand: Contact guard assist           General transfer comment: CGA for safety. Cues for hand placement.    Ambulation/Gait Ambulation/Gait assistance: Contact guard assist Gait Distance (Feet): 100 Feet Assistive device: Rolling walker (2 wheels) Gait Pattern/deviations: Step-through pattern Gait velocity: slightly decreased     General Gait Details: no LOB  Stairs            Wheelchair Mobility     Tilt Bed    Modified Rankin (Stroke Patients Only)       Balance Overall balance assessment: Needs assistance Sitting-balance support: Feet supported Sitting balance-Leahy Scale: Good     Standing balance support: During functional activity, Reliant on assistive device for balance Standing balance-Leahy Scale: Fair                               Pertinent Vitals/Pain Pain Assessment Pain Assessment: No/denies pain    Home Living Family/patient expects to be discharged to:: Private residence Living Arrangements: Other relatives (sister in law and niece) Available Help at Discharge: Family;Available PRN/intermittently Type of Home: House Home Access: Level entry     Alternate Level Stairs-Number of Steps: 12 Home Layout: Two level;1/2 bath on main level Home Equipment: Rolling Walker (2 wheels);Rollator (4 wheels);Cane - single point Additional Comments: information obtained from chart, patient with dementia at baseline    Prior Function Prior Level of Function : Independent/Modified Independent             Mobility Comments: Per niece who was present, pt uses a rollator at baseline. ADLs Comments: daughter assists with med mgmt and driving  per chart     Extremity/Trunk Assessment   Upper Extremity Assessment Upper Extremity Assessment: Generalized weakness LUE Deficits / Details: L shoulder weakness, patient states this is her baseline LUE Coordination: decreased gross motor    Lower Extremity Assessment Lower  Extremity Assessment: Generalized weakness    Cervical / Trunk Assessment Cervical / Trunk Assessment: Normal  Communication   Communication Communication: No apparent difficulties    Cognition Arousal: Alert Behavior During Therapy: WFL for tasks assessed/performed                             Following commands: Intact       Cueing Cueing Techniques: Verbal cues     General Comments General comments (skin integrity, edema, etc.): VSS on RA    Exercises     Assessment/Plan    PT Assessment Patient needs continued PT services  PT Problem List Decreased strength;Decreased activity tolerance;Decreased mobility;Decreased balance;Decreased cognition       PT Treatment Interventions DME instruction;Gait training;Stair training;Functional mobility training;Therapeutic activities;Therapeutic exercise;Balance training;Patient/family education    PT Goals (Current goals can be found in the Care Plan section)  Acute Rehab PT Goals Patient Stated Goal: return home PT Goal Formulation: With patient Time For Goal Achievement: 06/09/23 Potential to Achieve Goals: Good    Frequency Min 1X/week     Co-evaluation               AM-PAC PT "6 Clicks" Mobility  Outcome Measure Help needed turning from your back to your side while in a flat bed without using bedrails?: A Little Help needed moving from lying on your back to sitting on the side of a flat bed without using bedrails?: A Little Help needed moving to and from a bed to a chair (including a wheelchair)?: A Little Help needed standing up from a chair using your arms (e.g., wheelchair or bedside chair)?: A Little Help needed to walk in hospital room?: A Little Help needed climbing 3-5 steps with a railing? : A Little 6 Click Score: 18    End of Session Equipment Utilized During Treatment: Gait belt Activity Tolerance: Patient tolerated treatment well Patient left: in chair;with call bell/phone within  reach;with chair alarm set;with family/visitor present Nurse Communication: Mobility status PT Visit Diagnosis: Muscle weakness (generalized) (M62.81);Difficulty in walking, not elsewhere classified (R26.2)    Time: 1610-9604 PT Time Calculation (min) (ACUTE ONLY): 10 min   Charges:   PT Evaluation $PT Eval Moderate Complexity: 1 Mod   PT General Charges $$ ACUTE PT VISIT: 1 Visit         Shela Nevin, PT, DPT Acute Rehab Services [phone]   Gladys Damme 05/26/2023, 3:42 PM

## 2023-05-26 NOTE — Plan of Care (Signed)

## 2023-05-26 NOTE — Progress Notes (Signed)
 PROGRESS NOTE [name]  YQM:[id number] DOB: [birth year] DOA: 05/25/2023 PCP: Renford Dills, MD  Brief Narrative/Hospital Course: 78 year old female with history of HTN, CAD, CKD stage IV lower extremity DVT/renal artery thrombosis on Eliquis, chronic HFpEF, depression/memory loss, chronic diarrhea following up with GI presented to the ED with 11 pound weight gain in [redacted] week along with lower leg edema and shortness of breath despite taking extra Lasix dose. In the ED-hemodynamically stable, on room air afebrile- felt to have volume overload BNP 336 troponin 19, hemoglobin 7.9- chest x-ray without acute cardiopulmonary finding, patient was given Lasix, nephrology consulted and admitted for further management.     Subjective: Seen this am Daughter at bedside Still leg swollen but no shortness of breath Has semisolid brown stool Overnight vitals stable afebrile on room air Weight 158.5 from 161.8 lb UOP 2475cc Labs reviewed hypomagnesemia resolved from 0.9-1.9.  CBC and BMP pending :resulted later with hb up 7.9> 8.4, creat down 4.3> 3.9..  Assessment and Plan: Principal Problem:   SOB (shortness of breath) Active Problems:   Acute kidney injury superimposed on chronic kidney disease (HCC)   Acute on chronic heart failure with preserved ejection fraction (HFpEF) (HCC)   Benign essential HTN   CAD (coronary artery disease)   Renal vein thrombosis (HCC)   Type 2 diabetes mellitus (HCC)   Anemia   AKI on CKD stage V Metabolic acidosis: B/l creat 3.4-4.1 from jan-feb 2025 per nephrology with EGFR 11-13, patient with volume overload although chest x-ray clear and doing well on room air.  Nephrology consulted for diuresis.  Monitor intake output Daily weight electrolytes and continue current IV Lasix Recent Labs    05/08/23 1833 05/09/23 0442 05/09/23 2132 05/10/23 0527 05/11/23 0412 05/12/23 0557 05/13/23 0355 05/14/23 0802 05/17/23 1149 05/25/23 1219  BUN 119* 122* 122*  115* 96* 81* 79* 75* 66* 88*  CREATININE 5.98* 5.21* 5.10* 4.53* 3.59* 3.48* 3.75* 3.95* 3.94* 4.36*  CO2 12* 16* 16* 16* 23 27 26 25 22  15*  K 3.0* 2.6* 3.2* 2.9* 3.7 4.0 4.0 4.4 4.2 3.9    Acute on chronic CHF with preserved EF Demand ischemia: BNP 336 on admission with troponin 19> 23-indicating demand ischemia from CHF.  Continue on diuresis as above monitor fluid status.  Last echo 10/24/2021 with EF 60-65%, moderately elevated pulmonary artery systolic pressure. Repeat echo pending .  Chronic anemia Anemia from chronic kidney disease and folate deficiency anemia: Hemoglobin baseline around 8 to 10 g in last few months.  Last anemia panel reviewed from 05/09/2023 with normal B12 and folate deficiency. Stable iron panel.  Hemoccult test is negative.  Stool appears brown no evidence of GI bleeding occult or active  Added folate supplementation. Recent Labs  Lab 05/25/23 1219  HGB 7.9*  HCT 25.8*    History of renal thrombosis History of DVT: Eliquis.  Monitor hemoglobin closely  CAD Hypertension: BP fairly stable.  No chest pain.  Allow  BP to be on higher side for diuresis.  PTA on amlodipine-on hold for now  Diabetes mellitus: Last A1c 6.2 05/08/2023. PTA not on meds.  Monitor CBG  Chronic diarrhea: On colestipol.  Appears better.  Followed by [facility], currently no active GI bleeding advised outpatient follow-up  Hypomagnesemia: Resolved   DVT prophylaxis: apixaban (ELIQUIS) tablet 2.5 mg Start: 05/25/23 2200 Code Status:   Code Status: Full Code Family Communication: plan of care discussed with patient/daughter at bedside. Patient status is: Remains hospitalized because of severity of illness  Level of care: Telemetry Cardiac   Dispo: The patient is from: Home            Anticipated disposition: TBD Objective: Vitals last 24 hrs: Vitals:   05/25/23 1934 05/25/23 2325 05/26/23 0621 05/26/23 0829  BP: (!) 154/79 (!) 159/66 (!) 157/68 125/71  Pulse: 91 82 84   Resp:  16 18 16 16   Temp: 98.2 F (36.8 C) 98.5 F (36.9 C) 98.6 F (37 C) 97.6 F (36.4 C)  TempSrc: Oral Oral Oral Oral  SpO2: 100% 100% 99%   Weight:   71.9 kg   Height:       Weight change:   Physical Examination: General exam: alert awake,at baseline, older than stated age HEENT:Oral mucosa moist, Ear/Nose WNL grossly Respiratory system: Bilaterally clear BS,no use of accessory muscle Cardiovascular system: S1 & S2 +, No JVD. Gastrointestinal system: Abdomen soft,NT,ND, BS+ Nervous System: Alert, awake, moving all extremities,and following commands. Extremities: LE edema + b/l ,distal peripheral pulses palpable and warm.  Skin: No rashes,no icterus. MSK: Normal muscle bulk,tone, power   Medications reviewed:  Scheduled Meds:  apixaban  2.5 mg Oral BID   FLUoxetine  20 mg Oral Daily   folic acid  1 mg Oral Daily   lipase/protease/amylase  36,000 Units Oral TID AC   mirtazapine  7.5 mg Oral QHS   pantoprazole (PROTONIX) IV  40 mg Intravenous Q12H   rivastigmine  1.5 mg Oral BID   sodium chloride flush  3 mL Intravenous Q12H   Continuous Infusions:    Diet Order             Diet heart healthy/carb modified Room service appropriate? Yes; Fluid consistency: Thin; Fluid restriction: 1500 mL Fluid  Diet effective now                            Intake/Output Summary (Last 24 hours) at 05/26/2023 1049 Last data filed at 05/26/2023 4098 Gross per 24 hour  Intake 273.2 ml  Output 2475 ml  Net -2201.8 ml   Net IO Since Admission: -2,201.8 mL [05/26/23 1049]  Wt Readings from Last 3 Encounters:  05/26/23 71.9 kg  05/17/23 71.1 kg  05/14/23 71.1 kg     Unresulted Labs (From admission, onward)     Start     Ordered   05/27/23 0500  CBC  Daily,   R      05/26/23 0820   05/26/23 0820  Occult blood card to lab, stool  ONCE - STAT,   STAT        05/26/23 0820   05/26/23 0820  CBC  ONCE - URGENT,   URGENT        05/26/23 0820   05/26/23 0735  Renal function panel   Daily,   R      05/26/23 0734   05/26/23 0733  Creatinine, urine, random  Once,   R        05/26/23 0732   05/26/23 0733  Sodium, urine, random  Once,   R        05/26/23 0732   05/26/23 0733  Urinalysis, Routine w reflex microscopic -Urine, Unspecified Source  Once,   R       Question:  Specimen Source  Answer:  Urine, Unspecified Source   05/26/23 0732           Data Reviewed: I have personally reviewed following labs and imaging studies CBC: Recent Labs  Lab 05/25/23 1219  WBC 9.9  HGB 7.9*  HCT 25.8*  MCV 90.5  PLT 258   Basic Metabolic Panel:  Recent Labs  Lab 05/25/23 1219 05/25/23 2240 05/26/23 0616  NA 140  --   --   K 3.9  --   --   CL 112*  --   --   CO2 15*  --   --   GLUCOSE 94  --   --   BUN 88*  --   --   CREATININE 4.36*  --   --   CALCIUM 8.0*  --   --   MG  --  0.9* 1.9   GFR: Estimated Creatinine Clearance: 10.8 mL/min (A) (by C-G formula based on SCr of 4.36 mg/dL (H)). Liver Function Tests:  Recent Labs  Lab 05/25/23 1535  AST 14*  ALT 11  ALKPHOS 91  BILITOT 0.4  PROT 6.5  ALBUMIN 2.5*  Sepsis Labs: No results for input(s): "PROCALCITON", "LATICACIDVEN" in the last 168 hours. No results found for this or any previous visit (from the past 240 hours).  Antimicrobials/Microbiology: Anti-infectives (From admission, onward)    None         Component Value Date/Time   SDES BLOOD BLOOD LEFT ARM 07/29/2022 0236   SPECREQUEST  07/29/2022 0236    BOTTLES DRAWN AEROBIC AND ANAEROBIC Blood Culture adequate volume   CULT  07/29/2022 0236    NO GROWTH 5 DAYS Performed at [facility], [address], [place], [place] [address]    REPTSTATUS 08/03/2022 FINAL 07/29/2022 0236     Radiology Studies: DG Chest 2 View Result Date: 05/25/2023 CLINICAL DATA:  Chest pain EXAM: CHEST - 2 VIEW COMPARISON:  X-ray 05/05/2023 and older FINDINGS: Stable cardiopericardial silhouette. No consolidation, pneumothorax or effusion. No edema. Curvature  and degenerative changes of the spine. Surgical changes in the right axillary region and right upper abdomen, lower chest. Coronary stent. IMPRESSION: Chronic changes.  No acute cardiopulmonary disease. Electronically Signed   By: Karen Kays M.D.   On: 05/25/2023 13:01     LOS: 1 day   Total time spent in review of labs and imaging, patient evaluation, formulation of plan, documentation and communication with family: 35 minutes  Lanae Boast, MD  [facility]  05/26/2023, 10:49 AM

## 2023-05-26 NOTE — Progress Notes (Signed)
 Pt received 4 grams IV magnesium per orders.

## 2023-05-26 NOTE — Progress Notes (Signed)
 Patient currently oriented to self and place, reoriented to year and situation.  Patient currently going in and out of the bathroom to urinate due to lasix given.  PT assisted back to bed SR/ST with PVC.  Pt asymptomatic.  Will continue to monitor closely.

## 2023-05-26 NOTE — Progress Notes (Signed)
 PT Cancellation Note  Patient Details Name: [name] MRN: [id number] DOB: August 31, 1945   Cancelled Treatment:    Reason Eval/Treat Not Completed: Patient at procedure or test/unavailable (Pt receiving ECHO. Will follow up if time allows.)   Gladys Damme 05/26/2023, 10:49 AM

## 2023-05-26 NOTE — Progress Notes (Addendum)
 [facility] Progress Note  Subjective:  Seen in room Urine output 1.5 L yesterday and 900 cc today Creat down 3.9 today Doesn't feel any better or worse, legs still swollen  Vitals:   05/25/23 1800 05/25/23 1934 05/25/23 2325 05/26/23 0621  BP: (!) 177/67 (!) 154/79 (!) 159/66 (!) 157/68  Pulse: 98 91 82 84  Resp: 15 16 18 16   Temp: 99.3 F (37.4 C) 98.2 F (36.8 C) 98.5 F (36.9 C) 98.6 F (37 C)  TempSrc: Oral Oral Oral Oral  SpO2: 100% 100% 100% 99%  Weight:    71.9 kg  Height:        Exam: Gen alert, no distress Sclera anicteric, throat clear  No jvd or bruits Chest clear bilat to bases RRR no MRG Abd soft ntnd no mass or ascites +bs Ext 2+ bilateral pretibial edema Neuro is alert, Ox 3 , nf      Renal-related home meds: - norvasc 10 - lasix 40 mg daily - others: apixaban, colestipol, famotidine, fluoxetine, hydrocodone, mirtazapine, sl nitroglycerin, rivastigmine   Date                             Creat               eGFR (ml/min) 2007- 2009                  0.87-1.05 2010                            0.78-1.90 2011-2013                   1.03- 1.59 2015                            0.95- 1.80 2016- 2017                  1.11- 2.40 2018                            1.40- 2.05 2019                            1.08- 3.08 2020                            1.74- 4.42 2021                            1.61- 2.55 2022                            1.78- 6.19 2023                            2.17 - 5.35       8 --> 23 ml/min             2024                            1.82- 3.60 1/28  4.14                 11 ml/min 2/19- 05/17/23               5.98 >> 3.48    7 --> 13 ml/min 05/25/23                        4.36        BNP 336  CXR - chronic changes, no acute cardiopulmonary disease     Assessment/ Plan: AKI on CKD 5 - b/l creat 3.4- 4.1 from jan - feb 2025, eGFR 11-13 ml/min, f/b Dr Signe Colt at [facility]. Creat here is 4.3, not that far  from her baseline. She had significant LE edema, but clear CXR and was on RA on presentation. Pt started on IV lasix 40 bid and making ~ 1 L UOP. Will ^lasix to 80 bid IV and add zaroxolyn 2.5 daily. Will follow.  Acute on chronic HF w/ preserved EF - diuresis as above HTN - diuresing, can resume home norvasc if needed H/o renal vein thrombosis - on eliquis DM2 CAD           Vinson Moselle MD  [name] 05/26/2023, 7:30 AM  Recent Labs  Lab 05/25/23 1219 05/25/23 1535  HGB 7.9*  --   ALBUMIN  --  2.5*  CALCIUM 8.0*  --   CREATININE 4.36*  --   K 3.9  --    No results for input(s): "IRON", "TIBC", "FERRITIN" in the last 168 hours. Inpatient medications:  apixaban  2.5 mg Oral BID   FLUoxetine  20 mg Oral Daily   furosemide  40 mg Intravenous Daily   lipase/protease/amylase  36,000 Units Oral TID AC   mirtazapine  7.5 mg Oral QHS   pantoprazole (PROTONIX) IV  40 mg Intravenous Q12H   rivastigmine  1.5 mg Oral BID   sodium chloride flush  3 mL Intravenous Q12H    hydrALAZINE, nitroGLYCERIN

## 2023-05-27 DIAGNOSIS — R0602 Shortness of breath: Secondary | ICD-10-CM | POA: Diagnosis not present

## 2023-05-27 LAB — CBC
HCT: 24.6 % — ABNORMAL LOW (ref 36.0–46.0)
Hemoglobin: 7.6 g/dL — ABNORMAL LOW (ref 12.0–15.0)
MCH: 27.2 pg (ref 26.0–34.0)
MCHC: 30.9 g/dL (ref 30.0–36.0)
MCV: 88.2 fL (ref 80.0–100.0)
Platelets: 263 10*3/uL (ref 150–400)
RBC: 2.79 MIL/uL — ABNORMAL LOW (ref 3.87–5.11)
RDW: 18.9 % — ABNORMAL HIGH (ref 11.5–15.5)
WBC: 8.6 10*3/uL (ref 4.0–10.5)
nRBC: 0 % (ref 0.0–0.2)

## 2023-05-27 LAB — RENAL FUNCTION PANEL
Albumin: 2.1 g/dL — ABNORMAL LOW (ref 3.5–5.0)
Anion gap: 10 (ref 5–15)
BUN: 89 mg/dL — ABNORMAL HIGH (ref 8–23)
CO2: 19 mmol/L — ABNORMAL LOW (ref 22–32)
Calcium: 8.5 mg/dL — ABNORMAL LOW (ref 8.9–10.3)
Chloride: 111 mmol/L (ref 98–111)
Creatinine, Ser: 4.24 mg/dL — ABNORMAL HIGH (ref 0.44–1.00)
GFR, Estimated: 10 mL/min — ABNORMAL LOW (ref >60)
Glucose, Bld: 93 mg/dL (ref 70–99)
Phosphorus: 5.6 mg/dL — ABNORMAL HIGH (ref 2.5–4.6)
Potassium: 4 mmol/L (ref 3.5–5.1)
Sodium: 140 mmol/L (ref 135–145)

## 2023-05-27 MED ORDER — LOPERAMIDE HCL 2 MG PO CAPS
2.0000 mg | ORAL_CAPSULE | ORAL | Status: DC | PRN
  Administered 2023-05-27 (×2): 2 mg via ORAL
  Filled 2023-05-27 (×2): qty 1

## 2023-05-27 MED ORDER — RENA-VITE PO TABS
1.0000 | ORAL_TABLET | Freq: Every day | ORAL | Status: DC
  Administered 2023-05-27 – 2023-05-31 (×5): 1 via ORAL
  Filled 2023-05-27 (×5): qty 1

## 2023-05-27 MED ORDER — COLESTIPOL HCL 1 G PO TABS
1.0000 g | ORAL_TABLET | Freq: Three times a day (TID) | ORAL | Status: DC
  Administered 2023-05-27 – 2023-06-01 (×15): 1 g via ORAL
  Filled 2023-05-27 (×18): qty 1

## 2023-05-27 MED ORDER — FUROSEMIDE 10 MG/ML IJ SOLN
40.0000 mg | Freq: Two times a day (BID) | INTRAMUSCULAR | Status: DC
  Administered 2023-05-27 – 2023-05-28 (×2): 40 mg via INTRAVENOUS
  Filled 2023-05-27 (×2): qty 4

## 2023-05-27 MED ORDER — ADULT MULTIVITAMIN W/MINERALS CH: 1.0000 | ORAL_TABLET | Freq: Every day | ORAL | Status: DC

## 2023-05-27 MED ORDER — SODIUM BICARBONATE 650 MG PO TABS
650.0000 mg | ORAL_TABLET | Freq: Two times a day (BID) | ORAL | Status: DC
  Administered 2023-05-27 – 2023-06-01 (×10): 650 mg via ORAL
  Filled 2023-05-27 (×10): qty 1

## 2023-05-27 MED ORDER — THIAMINE MONONITRATE 100 MG PO TABS
100.0000 mg | ORAL_TABLET | Freq: Every day | ORAL | Status: AC
  Administered 2023-05-27 – 2023-05-31 (×4): 100 mg via ORAL
  Filled 2023-05-27 (×4): qty 1

## 2023-05-27 MED ORDER — ENSURE ENLIVE PO LIQD
237.0000 mL | Freq: Two times a day (BID) | ORAL | Status: DC
  Administered 2023-05-29 – 2023-06-01 (×4): 237 mL via ORAL

## 2023-05-27 NOTE — Progress Notes (Signed)
 Mobility Specialist Progress Note;   05/27/23 1200  Mobility  Activity Ambulated with assistance in hallway  Level of Assistance Contact guard assist, steadying assist  Assistive Device Front wheel walker  Distance Ambulated (ft) 200 ft  Activity Response Tolerated well  Mobility Referral Yes  Mobility visit 1 Mobility  Mobility Specialist Start Time (ACUTE ONLY) 1200  Mobility Specialist Stop Time (ACUTE ONLY) 1210  Mobility Specialist Time Calculation (min) (ACUTE ONLY) 10 min   Pt agreeable to mobility. Requested assistance to BR at BOS, void successful. Required MinG assistance during ambulation for safety. VSS throughout. Only c/o LLE pain while ambulating. Pt returned back to chair with all needs met.   [name] Mobility Specialist Please contact via SecureChat or [employer] [phone]

## 2023-05-27 NOTE — Plan of Care (Signed)
  Problem: Fluid Volume: Goal: Ability to maintain a balanced intake and output will improve Outcome: Progressing   Problem: Nutritional: Goal: Maintenance of adequate nutrition will improve Outcome: Progressing   Problem: Skin Integrity: Goal: Risk for impaired skin integrity will decrease Outcome: Progressing   Problem: Clinical Measurements: Goal: Respiratory complications will improve Outcome: Progressing Goal: Cardiovascular complication will be avoided Outcome: Progressing   Problem: Activity: Goal: Risk for activity intolerance will decrease Outcome: Progressing   Problem: Pain Managment: Goal: General experience of comfort will improve and/or be controlled Outcome: Progressing   Problem: Health Behavior/Discharge Planning: Goal: Ability to manage health-related needs will improve Outcome: Not Progressing

## 2023-05-27 NOTE — Progress Notes (Signed)
 Heart Failure Navigator Progress Note  Assessed for Heart & Vascular TOC clinic readiness.  Patient does not meet criteria due to  - EF 60-65%, AKI on CKD V , No HF TOC .  Navigator will sign off at this time.   Rhae Hammock, BSN, Scientist, clinical (histocompatibility and immunogenetics) Only

## 2023-05-27 NOTE — Progress Notes (Signed)
 PROGRESS NOTE [name]  WUJ:[id number] DOB: 1945-08-22 DOA: 05/25/2023 PCP: Renford Dills, MD  Brief Narrative/Hospital Course: 78 year old female with history of HTN, CAD, CKD stage IV lower extremity DVT/renal artery thrombosis on Eliquis, chronic HFpEF, depression/memory loss, chronic diarrhea following up with GI presented to the ED with 11 pound weight gain in [redacted] week along with lower leg edema and shortness of breath despite taking extra Lasix dose. In the ED-hemodynamically stable, on room air afebrile- felt to have volume overload BNP 336 troponin 19, hemoglobin 7.9- chest x-ray without acute cardiopulmonary finding, patient was given Lasix, nephrology consulted and admitted for further management.  Consultation: Nephrology Gastroenterology  Subjective: Seen and examined this morning  Daughter at the bedside leg still slightly edematous, hemoglobin down to 7.6 g creatinine 4.2 Overall feels better  Assessment and Plan: Principal Problem:   SOB (shortness of breath) Active Problems:   Acute kidney injury superimposed on chronic kidney disease (HCC)   Acute on chronic heart failure with preserved ejection fraction (HFpEF) (HCC)   Benign essential HTN   CAD (coronary artery disease)   Renal vein thrombosis (HCC)   Type 2 diabetes mellitus (HCC)   Anemia   AKI on CKD stage V Metabolic acidosis: B/l creat 3.4-4.1 from jan-feb 2025 per nephrology with EGFR 11-13, patient with volume overload although chest x-ray clear and doing well on room air.  Nephrology consulted managing with IV Lasix high-dose 80 twice daily monitor intake output Daily weight and monitoring renal function.  Continue plan per nephrology Recent Labs    05/09/23 2132 05/10/23 0527 05/11/23 0412 05/12/23 0557 05/13/23 0355 05/14/23 0802 05/17/23 1149 05/25/23 1219 05/26/23 1205 05/27/23 0516  BUN 122* 115* 96* 81* 79* 75* 66* 88* 82* 89*  CREATININE 5.10* 4.53* 3.59* 3.48* 3.75* 3.95* 3.94*  4.36* 3.95* 4.24*  CO2 16* 16* 23 27 26 25 22  15* 17* 19*  K 3.2* 2.9* 3.7 4.0 4.0 4.4 4.2 3.9 4.1 4.0    Acute on chronic CHF with preserved EF Demand ischemia: BNP 336 on admission with troponin 19> 23-indicating demand ischemia from CHF.  Continue on diuresis as above per nephrology and monitor fluid status. Last echo 10/24/2021 with EF 60-65%, moderately elevated pulmonary artery systolic pressure. Repeat echo pending .  Chronic anemia Anemia from chronic kidney disease and folate deficiency anemia: Hemoglobin baseline around 8 to 10 g in last few months.  Last anemia panel reviewed from 05/09/2023 with normal B12 and folate deficiency-folate added.  Hemoccult test negative, due to ongoing anemia GI consulted they are planning for EGD/colonoscopy Wednesday Thursday and holding Eliquis for now Recent Labs  Lab 05/25/23 1219 05/26/23 1205 05/27/23 0516  HGB 7.9* 8.4* 7.6*  HCT 25.8* 27.2* 24.6*    History of renal thrombosis History of DVT: Holding Eliquis as above.  CAD Hypertension: BP controlled.  No chest pain.Allow  BP to be on higher side for diuresis.  PTA on amlodipine-on hold for now  Diabetes mellitus: Last A1c 6.2 05/08/2023. PTA not on meds.  Monitor CBG No results for input(s): "GLUCAP", "HGBA1C" in the last 168 hours.   Chronic diarrhea: Continue Imodium and colestipol.  Followed by GI as outpatient  Hypomagnesemia: Resolved  DVT prophylaxis:  Code Status:   Code Status: Full Code Family Communication: plan of care discussed with patient/daughter at bedside. Patient status is: Remains hospitalized because of severity of illness Level of care: Telemetry Cardiac   Dispo: The patient is from: Home  Anticipated disposition: TBD Objective: Vitals last 24 hrs: Vitals:   05/26/23 0829 05/26/23 2005 05/27/23 0350 05/27/23 0600  BP: 125/71 (!) 156/79 (!) 163/74   Pulse:  97 81   Resp: 16 20    Temp: 97.6 F (36.4 C) 98.3 F (36.8 C) 98.3 F (36.8 C)    TempSrc: Oral Oral Oral   SpO2:  100% 94%   Weight:    71.1 kg  Height:       Weight change: 0 kg  Physical Examination: General exam: alert awake,  HEENT:Oral mucosa moist, Ear/Nose WNL grossly Respiratory system: Bilaterally clear BS,no use of accessory muscle Cardiovascular system: S1 & S2 +, No JVD. Gastrointestinal system: Abdomen soft,NT,ND, BS+ Nervous System: Alert, awake, moving all extremities,and following commands. Extremities: LE edema ++ distal peripheral pulses palpable and warm.  Skin: No rashes,no icterus. MSK: Normal muscle bulk,tone, power   Medications reviewed:  Scheduled Meds:  colestipol  1 g Oral TID   FLUoxetine  20 mg Oral Daily   folic acid  1 mg Oral Daily   furosemide  80 mg Intravenous BID   lipase/protease/amylase  36,000 Units Oral TID AC   metolazone  2.5 mg Oral Daily   mirtazapine  7.5 mg Oral QHS   pantoprazole  40 mg Oral Daily   rivastigmine  1.5 mg Oral BID   sodium chloride flush  3 mL Intravenous Q12H  Continuous Infusions:   Diet Order             Diet heart healthy/carb modified Room service appropriate? Yes; Fluid consistency: Thin; Fluid restriction: 1200 mL Fluid  Diet effective now                  Intake/Output Summary (Last 24 hours) at 05/27/2023 1147 Last data filed at 05/26/2023 2201 Gross per 24 hour  Intake --  Output 400 ml  Net -400 ml   Net IO Since Admission: -2,461.8 mL [05/27/23 1147]  Wt Readings from Last 3 Encounters:  05/27/23 71.1 kg  05/17/23 71.1 kg  05/14/23 71.1 kg     Unresulted Labs (From admission, onward)     Start     Ordered   05/27/23 1137  Calprotectin, Fecal  Once,   R        05/27/23 1136   05/27/23 0500  CBC  Daily,   R      05/26/23 0820   05/26/23 0735  Renal function panel  Daily,   R      05/26/23 0734          Data Reviewed: I have personally reviewed following labs and imaging studies CBC: Recent Labs  Lab 05/25/23 1219 05/26/23 1205 05/27/23 0516  WBC  9.9 8.7 8.6  HGB 7.9* 8.4* 7.6*  HCT 25.8* 27.2* 24.6*  MCV 90.5 88.9 88.2  PLT 258 300 263   Basic Metabolic Panel:  Recent Labs  Lab 05/25/23 1219 05/25/23 2240 05/26/23 0616 05/26/23 1205 05/27/23 0516  NA 140  --   --  140 140  K 3.9  --   --  4.1 4.0  CL 112*  --   --  112* 111  CO2 15*  --   --  17* 19*  GLUCOSE 94  --   --  97 93  BUN 88*  --   --  82* 89*  CREATININE 4.36*  --   --  3.95* 4.24*  CALCIUM 8.0*  --   --  8.5* 8.5*  MG  --  0.9* 1.9  --   --   PHOS  --   --   --  4.5 5.6*   GFR: Estimated Creatinine Clearance: 11.1 mL/min (A) (by C-G formula based on SCr of 4.24 mg/dL (H)). Liver Function Tests:  Recent Labs  Lab 05/25/23 1535 05/26/23 1205 05/27/23 0516  AST 14*  --   --   ALT 11  --   --   ALKPHOS 91  --   --   BILITOT 0.4  --   --   PROT 6.5  --   --   ALBUMIN 2.5* 2.4* 2.1*  Sepsis Labs: No results for input(s): "PROCALCITON", "LATICACIDVEN" in the last 168 hours. No results found for this or any previous visit (from the past 240 hours).  Antimicrobials/Microbiology: Anti-infectives (From admission, onward)    None         Component Value Date/Time   SDES BLOOD BLOOD LEFT ARM 07/29/2022 0236   SPECREQUEST  07/29/2022 0236    BOTTLES DRAWN AEROBIC AND ANAEROBIC Blood Culture adequate volume   CULT  07/29/2022 0236    NO GROWTH 5 DAYS Performed at [facility], [address], [place], [place] [address]    REPTSTATUS 08/03/2022 FINAL 07/29/2022 0236     Radiology Studies: ECHOCARDIOGRAM COMPLETE Result Date: 05/26/2023    ECHOCARDIOGRAM REPORT   Patient Name:   [name] Date of Exam: 05/26/2023 Medical Rec #:  [id number]          Height:       64.0 in Accession #:    [id number]         Weight:       158.5 lb Date of Birth:  1945/03/29          BSA:          1.772 m Patient Age:    77 years           BP:           157/68 mmHg Patient Gender: F                  HR:           85 bpm. Exam Location:  Inpatient Procedure: 2D  Echo, Color Doppler and Cardiac Doppler (Both Spectral and Color            Flow Doppler were utilized during procedure). Indications:    CHF-Acute Systolic  History:        Patient has prior history of Echocardiogram examinations, most                 recent 10/24/2021. CHF, CAD, Signs/Symptoms:Shortness of Breath;                 Risk Factors:Hypertension and Diabetes.  Sonographer:    Rosaland Lao Sonographer#2:  Delcie Roch RDCS Referring Phys: [phone] EKTA V PATEL IMPRESSIONS  1. Left ventricular ejection fraction, by estimation, is 60 to 65%. The left ventricle has normal function. The left ventricle has no regional wall motion abnormalities. There is moderate left ventricular hypertrophy. Left ventricular diastolic parameters are consistent with Grade I diastolic dysfunction (impaired relaxation).  2. Right ventricular systolic function is normal. The right ventricular size is normal. There is normal pulmonary artery systolic pressure.  3. The mitral valve is abnormal. Mild mitral valve regurgitation. No evidence of mitral stenosis.  4. The aortic valve is tricuspid. Aortic valve regurgitation is not visualized. No aortic stenosis is present.  5.  The inferior vena cava is normal in size with greater than 50% respiratory variability, suggesting right atrial pressure of 3 mmHg. FINDINGS  Left Ventricle: Left ventricular ejection fraction, by estimation, is 60 to 65%. The left ventricle has normal function. The left ventricle has no regional wall motion abnormalities. The left ventricular internal cavity size was normal in size. There is  moderate left ventricular hypertrophy. Left ventricular diastolic parameters are consistent with Grade I diastolic dysfunction (impaired relaxation). Right Ventricle: The right ventricular size is normal. Right vetricular wall thickness was not well visualized. Right ventricular systolic function is normal. There is normal pulmonary artery systolic pressure. The tricuspid  regurgitant velocity is 2.78 m/s, and with an assumed right atrial pressure of 3 mmHg, the estimated right ventricular systolic pressure is 33.9 mmHg. Left Atrium: Left atrial size was normal in size. Right Atrium: Right atrial size was normal in size. Pericardium: Trivial pericardial effusion is present. The pericardial effusion is circumferential. Mitral Valve: The mitral valve is abnormal. Mild mitral valve regurgitation. No evidence of mitral valve stenosis. Tricuspid Valve: The tricuspid valve is normal in structure. Tricuspid valve regurgitation is mild . No evidence of tricuspid stenosis. Aortic Valve: The aortic valve is tricuspid. Aortic valve regurgitation is not visualized. No aortic stenosis is present. Aortic valve mean gradient measures 3.1 mmHg. Aortic valve peak gradient measures 7.1 mmHg. Aortic valve area, by VTI measures 2.02 cm. Pulmonic Valve: The pulmonic valve was not well visualized. Pulmonic valve regurgitation is not visualized. No evidence of pulmonic stenosis. Aorta: The aortic root and ascending aorta are structurally normal, with no evidence of dilitation. Venous: The inferior vena cava is normal in size with greater than 50% respiratory variability, suggesting right atrial pressure of 3 mmHg. IAS/Shunts: No atrial level shunt detected by color flow Doppler.  LEFT VENTRICLE PLAX 2D LVIDd:         4.40 cm LVIDs:         2.70 cm LV PW:         1.30 cm LV IVS:        1.20 cm LVOT diam:     2.10 cm LV SV:         58 LV SV Index:   33 LVOT Area:     3.46 cm  RIGHT VENTRICLE             IVC RV Basal diam:  3.90 cm     IVC diam: 1.40 cm RV S prime:     15.60 cm/s TAPSE (M-mode): 2.2 cm LEFT ATRIUM           Index        RIGHT ATRIUM           Index LA diam:      3.90 cm 2.20 cm/m   RA Area:     12.70 cm LA Vol (A2C): 42.3 ml 23.87 ml/m  RA Volume:   25.10 ml  14.16 ml/m LA Vol (A4C): 58.3 ml 32.90 ml/m  AORTIC VALVE AV Area (Vmax):    2.36 cm AV Area (Vmean):   2.64 cm AV Area (VTI):      2.02 cm AV Vmax:           133.45 cm/s AV Vmean:          81.657 cm/s AV VTI:            0.288 m AV Peak Grad:      7.1 mmHg AV Mean Grad:  3.1 mmHg LVOT Vmax:         91.10 cm/s LVOT Vmean:        62.300 cm/s LVOT VTI:          0.168 m LVOT/AV VTI ratio: 0.58  AORTA Ao Root diam: 2.60 cm Ao Asc diam:  3.10 cm MITRAL VALVE               TRICUSPID VALVE MV Area (PHT): 3.53 cm    TR Peak grad:   30.9 mmHg MV Decel Time: 215 msec    TR Vmax:        278.00 cm/s MV E velocity: 70.20 cm/s MV A velocity: 97.50 cm/s  SHUNTS MV E/A ratio:  0.72        Systemic VTI:  0.17 m                            Systemic Diam: 2.10 cm Dina Rich MD Electronically signed by Dina Rich MD Signature Date/Time: 05/26/2023/2:46:41 PM    Final    DG Chest 2 View Result Date: 05/25/2023 CLINICAL DATA:  Chest pain EXAM: CHEST - 2 VIEW COMPARISON:  X-ray 05/05/2023 and older FINDINGS: Stable cardiopericardial silhouette. No consolidation, pneumothorax or effusion. No edema. Curvature and degenerative changes of the spine. Surgical changes in the right axillary region and right upper abdomen, lower chest. Coronary stent. IMPRESSION: Chronic changes.  No acute cardiopulmonary disease. Electronically Signed   By: Karen Kays M.D.   On: 05/25/2023 13:01     LOS: 2 days   Total time spent in review of labs and imaging, patient evaluation, formulation of plan, documentation and communication with family: 35 minutes  Lanae Boast, MD  [facility]  05/27/2023, 11:47 AM

## 2023-05-27 NOTE — TOC Initial Note (Signed)
 Transition of Care [facility]) - Initial/Assessment Note    Patient Details  Name: [name] MRN: [id number] Date of Birth: 1945/05/10  Transition of Care [facility]) CM/SW Contact:    Alesia Richards, RN Phone Number: [phone] 05/27/2023, 1:07 PM  Clinical Narrative:                   CM to patient's room regarding TOC screening assessment. Patient's daughters, Porfirio Mylar and Fredonia Highland at patient's bedside. Patient's daughters stated patient was diagnosed with Dementia. Per patient's daughter, patient is active with [facility] for PT/OT/HHA and SW. Per patient's daughters, Porfirio Mylar and Tamika discharge plan for patient to return home when medically ready with [facility]. CM call to Clifton Custard, point of contact, [facility], phone: [phone] regarding inpatient admission. Per Clifton Custard, patient is active with PT/OT/HHA and SW. Per Trudi Ida [facility] will continue to follow for resumption of care services. Per patient and patient's daughter, patient has rolling walker and cane. [name], Porfirio Mylar stated patient was hospitalized 2 weeks ago due to blood in stool. Per patient's daughter, Porfirio Mylar, plan is for patient to have EGD and colonoscopy prior to discharge.  Expected Discharge Plan: Home w Home Health Services (Patient is active with [facility] for PT/HHA) Barriers to Discharge: Continued Medical Work up   Patient Goals and CMS Choice Patient states their goals for this hospitalization and ongoing recovery are:: per patient's daughters, Porfirio Mylar and Fredonia Highland to return home with resumption of home health services.   Expected Discharge Plan and Services   Discharge Planning Services: CM Consult Post Acute Care Choice: Home Health Living arrangements for the past 2 months: Single Family Home       HH Arranged: PT, Nurse's Aide, Social Work, OT [employer] Agency:  [employer] [facility] phone [phone] POC: Clifton Custard) Date [facility] Agency Contacted: 05/27/23 Time HH  Agency Contacted: 1306 Representative spoke with at [facility] Agency: Clifton Custard  Prior Living Arrangements/Services Living arrangements for the past 2 months: Single Family Home Lives with:: Adult Children Patient language and need for interpreter reviewed:: Yes (no language interpreter needed) Do you feel safe going back to the place where you live?:  (AMS: Dementia)      Need for Family Participation in Patient Care: Yes (Comment) Care giver support system in place?: Yes (comment) Current home services: Homehealth aide, Home PT Criminal Activity/Legal Involvement Pertinent to Current Situation/Hospitalization: No - Comment as needed  Activities of Daily Living   ADL Screening (condition at time of admission) Independently performs ADLs?: Yes (appropriate for developmental age) Is the patient deaf or have difficulty hearing?: No Does the patient have difficulty seeing, even when wearing glasses/contacts?: No Does the patient have difficulty concentrating, remembering, or making decisions?: No  Permission Sought/Granted Permission sought to share information with : Case Manager Permission granted to share information with : No (Contact information on chart)  Share Information with NAME: [name]     Permission granted to share info w Relationship: Daughter  Permission granted to share info w Contact Information: [phone]  Emotional Assessment Appearance:: Appears stated age Attitude/Demeanor/Rapport: Engaged Affect (typically observed): Calm Orientation: : Fluctuating Orientation (Suspected and/or reported Sundowners) (Dementia per patient's daughets, Porfirio Mylar and Tamika) Alcohol / Substance Use: Not Applicable Psych Involvement: No (comment)  Admission diagnosis:  SOB (shortness of breath) [R06.02] Acute on chronic congestive heart failure, unspecified heart failure type [facility]) [I50.9] Patient Active Problem List   Diagnosis Date Noted   Mitral regurgitation 09/22/2022   Tricuspid  regurgitation  09/22/2022   Acute metabolic encephalopathy 07/28/2022   Anemia 02/18/2022   Pyuria, sterile 02/03/2022   Hypomagnesemia 12/26/2021   Acute lower UTI 11/05/2021   History of DVT (deep vein thrombosis) 11/05/2021   Depression 10/29/2021   Acute on chronic heart failure with preserved ejection fraction (HFpEF) (HCC) 10/24/2021   Chronic gastritis 10/19/2021   Weakness 12/02/2020   Chronic heart failure with preserved ejection fraction (HFpEF) (HCC) 12/02/2020   Elevated troponin 11/15/2020   Acute kidney injury superimposed on chronic kidney disease (HCC) 11/15/2020   Unintentional weight loss 11/15/2020   Malnutrition of moderate degree 09/27/2020   Transaminitis 09/23/2020   Acute renal failure superimposed on stage 4 chronic kidney disease (HCC) 09/03/2020   Chronic diastolic CHF (congestive heart failure) (HCC) 09/03/2020   Nausea & vomiting 09/03/2020   Dyspnea 06/28/2020   Chronic diarrhea - responds to budesonide 09/10/2019   Iron deficiency anemia 08/19/2018   Spondylosis of lumbar spine 12/24/2017   Renal vein thrombosis (HCC) 09/17/2017   Hoarse voice quality 09/09/2017   Low back pain without sciatica 09/09/2017   Hypokalemia due to excessive gastrointestinal loss of potassium    Leukocytosis    Essential hypertension    Urinary frequency    Type 2 diabetes mellitus (HCC)    Benign essential HTN    Dysphagia    Debility    Encephalopathy    Pressure injury of skin 08/04/2017   Diffuse pulmonary alveolar hemorrhage    SOB (shortness of breath) 10/11/2016   Hyperparathyroidism, secondary renal (HCC) 05/02/2016   Gout 04/11/2015   Diabetic neuropathy associated with type 2 diabetes mellitus (HCC) 01/06/2015   SBO (small bowel obstruction) (HCC) 09/15/2013   Breast cancer of lower-inner quadrant of right female breast (HCC) 03/18/2013   Chronic venous insufficiency of lower extremity 01/06/2013   Health care maintenance 05/08/2011   Seasonal allergies  05/03/2010   Labile hypertension    CAD (coronary artery disease)    Abdominal discomfort    Anemia of chronic kidney failure, stage 4 (severe) (HCC)    History of stroke without residual deficits    Chronic pain    Adnexal mass    GERD without esophagitis 01/19/2009   Spinal stenosis, lumbar region, with neurogenic claudication 01/03/2009   PCP:  Renford Dills, MD Pharmacy:   [facility] [address] [place] [address] Phone: [phone] Fax: [phone]  [facility] [place]), [place] - [address] [place] [address] Phone: [phone] Fax: [phone]  [facility] [address] [place] [address] Phone: [phone] Fax: [phone]  Social Drivers of Health (SDOH) Social History: SDOH Screenings   Food Insecurity: No Food Insecurity (05/25/2023)  Housing: Low Risk  (05/25/2023)  Transportation Needs: No Transportation Needs (05/25/2023)  Utilities: Not At Risk (05/25/2023)  Alcohol Screen: Low Risk  (10/25/2021)  Depression (PHQ2-9): Low Risk  (09/12/2020)  Financial Resource Strain: Low Risk  (10/25/2021)  Social Connections: Socially Isolated (05/25/2023)  Tobacco Use: Low Risk  (05/25/2023)   SDOH Interventions: Social Connections Interventions: Community Resources Provided, Inpatient TOC   Readmission Risk Interventions    05/13/2023    4:50 PM 12/27/2021    2:10 PM 11/08/2021   12:56 PM  Readmission Risk Prevention Plan  Transportation Screening Complete Complete Complete  Medication Review (RN Care Manager) Referral to Pharmacy Complete Complete  PCP or Specialist appointment within 3-5 days of discharge Complete Complete  Complete  HRI or Home Care Consult Complete Complete   SW Recovery Care/Counseling Consult Complete Complete Complete  Palliative Care Screening Not Applicable  Not Applicable  Skilled Nursing  Facility Not Applicable Not Applicable Not Applicable

## 2023-05-27 NOTE — Progress Notes (Signed)
 Occupational Therapy Treatment Patient Details Name: [name] MRN: [id number] DOB: May 06, 1945 Today's Date: 05/27/2023   History of present illness Patient is a 78 yo female presenting to the ED with weight gain of 11 pounds in 1 week on 05/25/23. Admitted with SoB and AKI. Patient with multiple admissions PMH:  CKD IV, HTN, CHF, CAD, DVT, CVA, DM2, gout, chronic gastritis, breast CA, dementia, depression.   OT comments  Pt making good progress with functional goals. Pt's daughter present and very supportive. OT will continue to follow acutely to maximize level of function and safety      If plan is discharge home, recommend the following:  A little help with walking and/or transfers;A little help with bathing/dressing/bathroom;Direct supervision/assist for medications management;Direct supervision/assist for financial management;Help with stairs or ramp for entrance;Supervision due to cognitive status;Assist for transportation   Equipment Recommendations  None recommended by OT    Recommendations for Other Services      Precautions / Restrictions Precautions Precautions: Fall Recall of Precautions/Restrictions: Impaired Precaution/Restrictions Comments: memory impairment Restrictions Weight Bearing Restrictions Per Provider Order: No       Mobility Bed Mobility               General bed mobility comments: pt in recliner upon arrival    Transfers Overall transfer level: Needs assistance Equipment used: Rolling walker (2 wheels) Transfers: Sit to/from Stand Sit to Stand: Contact guard assist, Supervision           General transfer comment: CGA for safety. Cues for hand placement.     Balance Overall balance assessment: Needs assistance Sitting-balance support: Feet supported Sitting balance-Leahy Scale: Good     Standing balance support: During functional activity, Reliant on assistive device for balance Standing balance-Leahy Scale: Fair                              ADL either performed or assessed with clinical judgement   ADL Overall ADL's : Needs assistance/impaired     Grooming: Wash/dry hands;Wash/dry face;Supervision/safety;Standing       Lower Body Bathing: Contact guard assist Lower Body Bathing Details (indicate cue type and reason): simulated     Lower Body Dressing: Contact guard assist   Toilet Transfer: Contact guard assist;Supervision/safety;Cueing for safety   Toileting- Clothing Manipulation and Hygiene: Supervision/safety;Sit to/from stand       Functional mobility during ADLs: Contact guard assist;Supervision/safety;Cueing for safety      Extremity/Trunk Assessment Upper Extremity Assessment Upper Extremity Assessment: Generalized weakness LUE Deficits / Details: L shoulder weakness, patient states this is her baseline LUE Coordination: decreased gross motor       Cervical / Trunk Assessment Cervical / Trunk Assessment: Normal    Vision Baseline Vision/History: 0 No visual deficits Ability to See in Adequate Light: 0 Adequate Patient Visual Report: No change from baseline     Perception     Praxis     Communication Communication Communication: No apparent difficulties   Cognition Arousal: Alert Behavior During Therapy: WFL for tasks assessed/performed Cognition: History of cognitive impairments             OT - Cognition Comments: Patient with dementia at baseline                 Following commands: Intact        Cueing   Cueing Techniques: Verbal cues  Exercises      Shoulder Instructions  General Comments      Pertinent Vitals/ Pain       Pain Assessment Pain Assessment: No/denies pain  Home Living Family/patient expects to be discharged to:: Private residence Living Arrangements: Alone;Other relatives (aunts and cousins are there at times)                                      Prior Functioning/Environment               Frequency  Min 2X/week        Progress Toward Goals  OT Goals(current goals can now be found in the care plan section)  Progress towards OT goals: Progressing toward goals     Plan      Co-evaluation                 AM-PAC OT "6 Clicks" Daily Activity     Outcome Measure   Help from another person eating meals?: None Help from another person taking care of personal grooming?: A Little Help from another person toileting, which includes using toliet, bedpan, or urinal?: A Little Help from another person bathing (including washing, rinsing, drying)?: A Little Help from another person to put on and taking off regular upper body clothing?: A Little Help from another person to put on and taking off regular lower body clothing?: A Little 6 Click Score: 19    End of Session Equipment Utilized During Treatment: Gait belt;Rolling walker (2 wheels)  OT Visit Diagnosis: Unsteadiness on feet (R26.81);Muscle weakness (generalized) (M62.81);History of falling (Z91.81)   Activity Tolerance Patient tolerated treatment well   Patient Left with call bell/phone within reach;in chair;with chair alarm set;with family/visitor present   Nurse Communication          Time: 1021-1040 OT Time Calculation (min): 19 min  Charges: OT General Charges $OT Visit: 1 Visit OT Treatments $Self Care/Home Management : 8-22 mins    Galen Manila 05/27/2023, 12:51 PM

## 2023-05-27 NOTE — Progress Notes (Addendum)
 Initial Nutrition Assessment  DOCUMENTATION CODES:   Non-severe (moderate) malnutrition in context of acute illness/injury  INTERVENTION:   Liberalize diet to promote PO intake  Ensure Enlive po BID, each supplement provides 350 kcal and 20 grams of protein. Rena-Vite daily  Snacks 100 mg Thiamine x 5 days   NUTRITION DIAGNOSIS:   Moderate Malnutrition related to acute illness as evidenced by mild fat depletion, mild muscle depletion, energy intake < 75% for > 7 days.   GOAL:   Patient will meet greater than or equal to 90% of their needs   MONITOR:   PO intake, Supplement acceptance, I & O's, Labs  REASON FOR ASSESSMENT:   Consult Assessment of nutrition requirement/status  ASSESSMENT:  78 y.o female with PMH of HTN, CAD, CKD stage 5, DVT, chronic HFpEF, memory loss, T2DM, HLD, gout, SBO. Presented with lower extremity swelling, shortness of breath, and ongoing dark loose stools for 3 weeks, . Found to have worsening kidney function and possible GI bleed.   Patient resting in chair with granddaughter at bedside. Patient can be a poor historian at times and granddaughter provided most of her history. Patient has been having ongoing dark loose stools with some maroon-colored blood for 3 weeks. Recently seen by outpatient GI. Was C diff and GI pathogen negative. Having 4-6 loose stools per day.  Family reports patient had a good appetite before her acute illness. Within the last few weeks patients intake has declined. Only eating 2 small meals per day. Eggs, bacon, and fruit for breakfast, skips lunch, and fettuccine alfredo for dinner. Drinks soda at home. They endorse no weight loss however patient on lasix and still has lower extremity edema, which RD suspects is masking true weight loss. UBW to be around 150-155 lbs.   Tentative plan for EGD and colonoscopy for evaluation of GI bleed and diarrhea on Wednesday or Thursday. RD encouraged PO intake and use of oral nutritional  supplements. 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 Granddaughter ok with trying Ensure and snacks. Had 75% of her breakfast   Admit weight: 73.4 kg - Fluid  Current weight: 71.1 kg    Average Meal Intake: 3/9-3/10: 75% intake x 2 recorded meals  Nutritionally Relevant Medications: Scheduled Meds:  colestipol  1 g Oral TID   [START ON 05/28/2023] feeding supplement  237 mL Oral BID BM   FLUoxetine  20 mg Oral Daily   folic acid  1 mg Oral Daily   furosemide  40 mg Intravenous BID   lipase/protease/amylase  36,000 Units Oral TID AC   mirtazapine  7.5 mg Oral QHS   multivitamin with minerals  1 tablet Oral Daily   pantoprazole  40 mg Oral Daily   rivastigmine  1.5 mg Oral BID   sodium bicarbonate  650 mg Oral BID   sodium chloride flush  3 mL Intravenous Q12H   thiamine  100 mg Oral Daily    Labs Reviewed: BUN 89, Creatinine 4.24, Calcium 8.5, Phosphorus 5.6, folate 5.3, HGB 7.6 CBG ranges from 115-165 mg/dL over the last 24 hours HgbA1c 6.2  NUTRITION - FOCUSED PHYSICAL EXAM:  Flowsheet Row Most Recent Value  Orbital Region No depletion  Upper Arm Region Mild depletion  Thoracic and Lumbar Region Mild depletion  Buccal Region Mild depletion  Temple Region Mild depletion  Clavicle Bone Region Mild depletion  Clavicle and Acromion Bone Region Mild depletion  Scapular  Bone Region Mild depletion  Dorsal Hand Mild depletion  Patellar Region Unable to assess  [Fluid]  Anterior Thigh Region Unable to assess  Posterior Calf Region Unable to assess  Edema (RD Assessment) Mild  Hair Reviewed  Eyes Reviewed  Mouth Reviewed  Skin Reviewed  Nails Reviewed       Diet Order:   Diet Order             Diet Heart Room service appropriate? Yes with Assist; Fluid consistency: Thin; Fluid restriction: 1200 mL Fluid  Diet effective now                   EDUCATION NEEDS:   Education  needs have been addressed  Skin:  Skin Assessment: Reviewed RN Assessment  Last BM:  05/27/23 type 7  Height:   Ht Readings from Last 1 Encounters:  05/25/23 5\' 4"  (1.626 m)    Weight:   Wt Readings from Last 1 Encounters:  05/27/23 71.1 kg    Ideal Body Weight:  54.5 kg  BMI:  Body mass index is 26.91 kg/m.  Estimated Nutritional Needs:   Kcal:  1600-1800 kcal  Protein:  80-100 gm  Fluid:  >1.6 L/day   Elliot Dally, RD Registered Dietitian  See Amion for more information

## 2023-05-27 NOTE — Discharge Instructions (Signed)
 [facility]   [facility]  - Address: [address] [place], [place] [address] - General office #:  [phone] - Enrollment Phone #: [phone]  [facility]  - [facility] Line: call toll free, available 24 hours a day, at [phone]   [address]

## 2023-05-27 NOTE — Consult Note (Signed)
 Referring Provider: TH Primary Care Physician:  Renford Dills, MD Primary Gastroenterologist:  Dr. Levora Angel  Reason for Consultation: Anemia, diarrhea, acute on chronic kidney disease  HPI: [name] is a 78 y.o. 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  Was found to have worsening of kidney function with creatinine of 4.36.  Was also found to have mild drop in hemoglobin to 7.9.  Patient was complaining of intermittent dark black stools.  Occult blood negative.  GI is consulted for further evaluation.  Patient was seen by GI team and inpatient on May 10, 2023 for worsening diarrhea.  C. difficile and GI pathogen panel was negative.  She was advised not to take magnesium supplement.  Colestipol was increased.  Creon was continued.  Currently patient's daughter, she had some improvement in diarrhea for few days but then subsequently started having diarrhea again.  Having 4-6 loose stools per day.  They have been seeing some dark maroon-colored blood in the stool.   Previous GI workup: History of chronic diarrhea since 2002 with questionable history of Crohn's ileitis s/p right hemocolectomy and ileotransverse colonic anastomosis. Tissue specimen from biopsy and surgical margin did not show any evidence of Crohn's disease. Positive P ASCA but no improvement in diarrhea with Entocort, prednisone, and ASA compounds making Crohn's less likely. Diarrhea has been controlled in the past with Colestid.   EGD, Dr.Gessner 08/2019: esophagitis   Colonoscopy 05/05/2018 (with [facility] [place]) - Patent end-to-end colonic anastomosis, healthy appearing mucosa. Internal and external hemorrhoids. No repeat due to age.   EGD 09/08/2020 (inpatient,  Dr. Matthias Hughs) - 2 cm hiatal hernia, mild chronic gastritis, negative for H. Pylori and celiac   Colonoscopy, 06/2008, Dr. Leone Payor: Changes of prior right hemicolectomy, repeat recommended in 10 years, biopsies from terminal ileum unremarkable, random colon biopsies unremarkable.   EGD, 2003, Dr. Russella Dar: Esophagitis, hiatal hernia, no evidence of celiac, no evidence of H. Pylori   Pathology 12/06/2001: 32 cm of distal ileum and 2 cm of cecum removed.  Terminal ileum, cecum, appendix, segmental resection showed small bowel obstruction associated with adhesions, no acute inflammation or malignancy   Pathology 12/2000: Unremarkable biopsies from terminal ileum and random colon  Past Medical History:  Diagnosis Date   Abdominal discomfort    Chronic N/V/D. Presumptive dx Crohn's dx per elevated p ANCA. Failed Entocort and Pentasa. Sep 2003 - ileocolectomy c anastomosis per Dr Orson Slick 2/2 adhesions - path was hegative for Crohns. EGD, Sm bowel follow through (11/03), and an eteroclysis (10/03) were unrevealing. Cuases hypomag and hypocalcemia.   Adnexal mass 10/2001   s/p lap BSO (R ovarian fibroma) & lysis of adhesions   Allergy    Seasonal   Anemia    Multifactorial. Baseline HgB 10-11 ish. B12 def - 150 in 3/10. Fe Def - ferritin 35 3/10. Both are being repleted.   Breast cancer (HCC) 03/16/2013   right, 5 o'clock   CAD (coronary artery disease) 1996   1996 - PTCA and angioplasty diagonal branch. 2000 - Rotoblator & angiopllasty of diagonal. 2006 - subendocardial AMI, DES to proximal LAD.Marland Kitchen Also had 90% stenosis in distal apical LAD. EF 55 with apical hypokinesis. Indefinite ASA and Plavix.   CHF (congestive heart failure) (HCC)    Chronic kidney disease, stage 4 (severe) (  HCC)    Chronic pain    CT 10/10 = Spinal stenosis L2 - S1.   COVID-19 11/02/2020   Diabetes mellitus    Insulin dependent   Diabetes mellitus type 2 in obese 05/03/2010   Managed on lantus and novolog. Has diabetic  nephropathy. Metformin D/C'd 2012 2/2 creatinine.  No diabetic retinopathy per 3/11.    Gout    Hx of radiation therapy 06/02/13- 07/16/13   right rbeast 4500 cGy 25 sessions, right breast boost 1600 cGy in 8 sessions   Hyperlipidemia    Managed with both a statin and Welchol. Welchol stopped 2014 2/2 cost and started on fenofibrate    Hypertension    2006 B renal arteries patent. 2003 MRA - no RAS. 2003 pheo W/U Dr Caryn Section reportedly negative.   Hypoxia 07/23/2017   Lupus    Lymphedema of breast    Personal history of radiation therapy 2015   Pneumonia of left lung due to infectious organism    RBBB    Renal vein thrombosis (HCC)    SBO (small bowel obstruction) (HCC) 09/17/2017   Secondary hyperparathyroidism (HCC)    Stroke (HCC)    Incidental finding MRI 2002 L lacunar infarct   Vitamin B12 deficiency    Vitamin D deficiency    Wears dentures    top    Past Surgical History:  Procedure Laterality Date   ABDOMINAL HYSTERECTOMY     BILATERAL SALPINGOOPHORECTOMY  8/03   Lap BSO (R ovarian fibroma) and adhesion lysis   BIOPSY  09/08/2020   Procedure: BIOPSY;  Surgeon: Bernette Redbird, MD;  Location: [facility];  Service: Endoscopy;;   BOWEL RESECTION  2003   ileocolectomy with anastomosis 2/2 adhesions   BREAST BIOPSY Right 2015   BREAST BIOPSY Right 07/2014   BREAST LUMPECTOMY Right 04/22/2013   BREAST LUMPECTOMY WITH NEEDLE LOCALIZATION AND AXILLARY SENTINEL LYMPH NODE BX Right 04/22/2013   Procedure: BREAST LUMPECTOMY WITH NEEDLE LOCALIZATION AND AXILLARY SENTINEL LYMPH NODE BX;  Surgeon: Almond Lint, MD;  Location: [facility];  Service: General;  Laterality: Right;   CARDIAC CATHETERIZATION     2 stents   CHOLECYSTECTOMY     COLONOSCOPY     ESOPHAGOGASTRODUODENOSCOPY N/A 09/08/2020   Procedure: ESOPHAGOGASTRODUODENOSCOPY (EGD);  Surgeon: Bernette Redbird, MD;  Location: [facility];  Service: Endoscopy;  Laterality: N/A;   HEMICOLECTOMY     R sided  hemicolectomy   HERNIA REPAIR     Ventral hernia repair   PTCA  4/06    Prior to Admission medications   Medication Sig Start Date End Date Taking? Authorizing Provider  acetaminophen (TYLENOL) 650 MG CR tablet Take 1,300 mg by mouth every 8 (eight) hours as needed for pain.   Yes [provider]  amLODipine (NORVASC) 10 MG tablet Take 1 tablet (10 mg total) by mouth daily. 10/26/22  Yes   apixaban (ELIQUIS) 2.5 MG TABS tablet Take 1 tablet (2.5 mg total) by mouth 2 (two) times daily. 10/30/21  Yes Arrien, York Ram, MD  cephALEXin (KEFLEX) 500 MG capsule Take 1 capsule (500 mg total) by mouth daily for 14 days. 05/17/23 05/31/23 Yes Caron Presume, MD  colestipol (COLESTID) 1 g tablet Take 1 tablet (1 g total) by mouth 3 (three) times daily. 05/14/23  Yes Deanna Artis, DO  diclofenac Sodium (VOLTAREN) 1 % GEL Apply 1 Application topically 4 (four) times daily as needed (knee pain).   Yes [provider]  diphenoxylate-atropine (LOMOTIL) 2.5-0.025 MG tablet  Take 1 tablet by mouth 2 (two) times daily as needed for diarrhea or loose stools. 05/14/23 05/13/24 Yes Deanna Artis, DO  doxycycline (VIBRAMYCIN) 100 MG capsule Take 1 capsule (100 mg total) by mouth 2 (two) times daily for 10 days. 05/17/23 05/27/23 Yes Caron Presume, MD  famotidine (PEPCID) 20 MG tablet Take 20 mg by mouth daily as needed for heartburn or indigestion. 10/14/21  Yes [provider]  FLUoxetine (PROZAC) 20 MG capsule Take 20 mg by mouth daily. 05/05/23  Yes [provider]  folic acid (FOLVITE) 1 MG tablet Take 1 tablet (1 mg total) by mouth daily. 05/14/23 05/13/24 Yes Deanna Artis, DO  furosemide (LASIX) 40 MG tablet Take 1 tablet (40 mg total) by mouth daily. 04/09/23  Yes   gabapentin (NEURONTIN) 300 MG capsule Take 300 mg by mouth at bedtime. 05/02/23  Yes [provider]  HYDROcodone-acetaminophen (NORCO/VICODIN) 5-325 MG tablet Take 2 tablets by mouth every 6 (six)  hours as needed. 05/17/23  Yes Caron Presume, MD  lipase/protease/amylase (CREON) 36000 UNITS CPEP capsule Take 1 capsule (36,000 Units total) by mouth 3 (three) times daily before meals. 05/14/23  Yes Deanna Artis, DO  loperamide (IMODIUM) 2 MG capsule Take 1 capsule (2 mg total) by mouth as needed for diarrhea or loose stools. 05/14/23  Yes Deanna Artis, DO  mirtazapine (REMERON) 7.5 MG tablet Take 7.5 mg by mouth at bedtime. 04/22/23  Yes [provider]  nitroGLYCERIN (NITROSTAT) 0.4 MG SL tablet DISSOLVE 1 TABLET UNDER THE TONGUE EVERY 5 MINUTES AS  NEEDED FOR CHEST PAIN. MAX  OF 3 TABLETS IN 15 MINUTES. CALL 911 IF PAIN PERSISTS. 10/04/20  Yes Jake Bathe, MD  pantoprazole (PROTONIX) 40 MG tablet Take 1 tablet (40 mg total) by mouth 2 (two) times daily. Patient taking differently: Take 40 mg by mouth daily. 12/23/21  Yes Jacalyn Lefevre, MD  rivastigmine (EXELON) 1.5 MG capsule Take 1 capsule (1.5 mg total) by mouth 2 (two) times daily. 12/27/22 07/25/23 Yes Camara, Amalia Hailey, MD  tiZANidine (ZANAFLEX) 2 MG tablet Take 2 mg by mouth at bedtime as needed. 04/09/23  Yes [provider]    Scheduled Meds:  apixaban  2.5 mg Oral BID   colestipol  1 g Oral TID   FLUoxetine  20 mg Oral Daily   folic acid  1 mg Oral Daily   furosemide  80 mg Intravenous BID   lipase/protease/amylase  36,000 Units Oral TID AC   metolazone  2.5 mg Oral Daily   mirtazapine  7.5 mg Oral QHS   pantoprazole  40 mg Oral Daily   rivastigmine  1.5 mg Oral BID   sodium chloride flush  3 mL Intravenous Q12H   Continuous Infusions: PRN Meds:.hydrALAZINE, loperamide, nitroGLYCERIN  Allergies as of 05/25/2023 - Review Complete 05/25/2023  Allergen Reaction Noted   Anesthetics, halogenated Other (See Comments) 10/25/2017   Etomidate Other (See Comments) 05/05/2018   Fentanyl Other (See Comments) 05/05/2018   Percocet [oxycodone-acetaminophen] Other (See Comments) 05/05/2013   Diazepam Other (See  Comments) 04/17/2006   Lorazepam Nausea Only 07/08/2020   Tramadol hcl Nausea And Vomiting 06/10/2020   Haloperidol Nausea And Vomiting 07/08/2020   Morphine sulfate Nausea Only 07/08/2020   Propoxyphene Nausea And Vomiting 07/08/2020   Versed [midazolam] Other (See Comments) 05/05/2018    Family History  Problem Relation Age of Onset   Breast cancer Mother 3   Pancreatic cancer Brother 31   Lung cancer Maternal  Aunt    Breast cancer Maternal Aunt    Kidney failure Maternal Aunt    Prostate cancer Maternal Uncle    Heart attack Maternal Grandmother    Breast cancer Maternal Grandmother    Colon cancer Maternal Grandmother    Clotting disorder Maternal Grandmother    Diabetes Maternal Grandmother    Hypertension Daughter    Pancreatic cancer Other     Social History   Socioeconomic History   Marital status: Widowed    Spouse name: Not on file   Number of children: 5   Years of education: Not on file   Highest education level: High school graduate  Occupational History   Occupation: retired    Associate Professor: [employer]  Tobacco Use   Smoking status: Never   Smokeless tobacco: Never  Vaping Use   Vaping status: Never Used  Substance and Sexual Activity   Alcohol use: No    Alcohol/week: 0.0 standard drinks of alcohol   Drug use: No   Sexual activity: Not on file  Other Topics Concern   Not on file  Social History Narrative   She is separated 2 sons 3 daughters   She has grandchildren and great-grandchildren   She is retired   Never smoker no tobacco now no drugs no alcohol no caffeine   Right handed   Social Drivers of Corporate investment banker Strain: Low Risk  (10/25/2021)   Overall Financial Resource Strain (CARDIA)    Difficulty of Paying Living Expenses: Not very hard  Food Insecurity: No Food Insecurity (05/25/2023)   Hunger Vital Sign    Worried About Running Out of Food in the Last Year: Never true    Ran Out of Food in the Last Year: Never  true  Transportation Needs: No Transportation Needs (05/25/2023)   PRAPARE - Administrator, Civil Service (Medical): No    Lack of Transportation (Non-Medical): No  Physical Activity: Not on file  Stress: Not on file  Social Connections: Socially Isolated (05/25/2023)   Social Connection and Isolation Panel [NHANES]    Frequency of Communication with Friends and Family: More than three times a week    Frequency of Social Gatherings with Friends and Family: Three times a week    Attends Religious Services: Never    Active Member of Clubs or Organizations: No    Attends Banker Meetings: Never    Marital Status: Separated  Intimate Partner Violence: Not At Risk (05/25/2023)   Humiliation, Afraid, Rape, and Kick questionnaire    Fear of Current or Ex-Partner: No    Emotionally Abused: No    Physically Abused: No    Sexually Abused: No    Review of Systems: All negative except as stated above in HPI.  Physical Exam: Vital signs: Vitals:   05/26/23 2005 05/27/23 0350  BP: (!) 156/79 (!) 163/74  Pulse: 97 81  Resp: 20   Temp: 98.3 F (36.8 C) 98.3 F (36.8 C)  SpO2: 100% 94%   Last BM Date : 05/26/23 General:   Alert,  Well-developed, well-nourished, pleasant and cooperative in NAD Lungs: No visible respiratory distress Heart:  Regular rate and rhythm; no murmurs, clicks, rubs,  or gallops. Abdomen: Soft, nontender, nondistended, bowel sound present, no peritoneal signs Lower extremity edema noted Mood and affect normal Alert and oriented x 3 Rectal:  Deferred  GI:  Lab Results: Recent Labs    05/25/23 1219 05/26/23 1205 05/27/23 0516  WBC 9.9 8.7  8.6  HGB 7.9* 8.4* 7.6*  HCT 25.8* 27.2* 24.6*  PLT 258 300 263   BMET Recent Labs    05/25/23 1219 05/26/23 1205 05/27/23 0516  NA 140 140 140  K 3.9 4.1 4.0  CL 112* 112* 111  CO2 15* 17* 19*  GLUCOSE 94 97 93  BUN 88* 82* 89*  CREATININE 4.36* 3.95* 4.24*  CALCIUM 8.0* 8.5* 8.5*    LFT Recent Labs    05/25/23 1535 05/26/23 1205 05/27/23 0516  PROT 6.5  --   --   ALBUMIN 2.5*   < > 2.1*  AST 14*  --   --   ALT 11  --   --   ALKPHOS 91  --   --   BILITOT 0.4  --   --   BILIDIR <0.1  --   --   IBILI NOT CALCULATED  --   --    < > = values in this interval not displayed.   PT/INR No results for input(s): "LABPROT", "INR" in the last 72 hours.   Studies/Results: ECHOCARDIOGRAM COMPLETE Result Date: 05/26/2023    ECHOCARDIOGRAM REPORT   Patient Name:   [name] Date of Exam: 05/26/2023 Medical Rec #:  [id number]          Height:       64.0 in Accession #:    [id number]         Weight:       158.5 lb Date of Birth:  12/08/1945          BSA:          1.772 m Patient Age:    77 years           BP:           157/68 mmHg Patient Gender: F                  HR:           85 bpm. Exam Location:  Inpatient Procedure: 2D Echo, Color Doppler and Cardiac Doppler (Both Spectral and Color            Flow Doppler were utilized during procedure). Indications:    CHF-Acute Systolic  History:        Patient has prior history of Echocardiogram examinations, most                 recent 10/24/2021. CHF, CAD, Signs/Symptoms:Shortness of Breath;                 Risk Factors:Hypertension and Diabetes.  Sonographer:    Rosaland Lao Sonographer#2:  Delcie Roch RDCS Referring Phys: [phone] EKTA V PATEL IMPRESSIONS  1. Left ventricular ejection fraction, by estimation, is 60 to 65%. The left ventricle has normal function. The left ventricle has no regional wall motion abnormalities. There is moderate left ventricular hypertrophy. Left ventricular diastolic parameters are consistent with Grade I diastolic dysfunction (impaired relaxation).  2. Right ventricular systolic function is normal. The right ventricular size is normal. There is normal pulmonary artery systolic pressure.  3. The mitral valve is abnormal. Mild mitral valve regurgitation. No evidence of mitral stenosis.  4. The aortic  valve is tricuspid. Aortic valve regurgitation is not visualized. No aortic stenosis is present.  5. The inferior vena cava is normal in size with greater than 50% respiratory variability, suggesting right atrial pressure of 3 mmHg. FINDINGS  Left Ventricle: Left ventricular ejection fraction, by estimation, is 60 to 65%.  The left ventricle has normal function. The left ventricle has no regional wall motion abnormalities. The left ventricular internal cavity size was normal in size. There is  moderate left ventricular hypertrophy. Left ventricular diastolic parameters are consistent with Grade I diastolic dysfunction (impaired relaxation). Right Ventricle: The right ventricular size is normal. Right vetricular wall thickness was not well visualized. Right ventricular systolic function is normal. There is normal pulmonary artery systolic pressure. The tricuspid regurgitant velocity is 2.78 m/s, and with an assumed right atrial pressure of 3 mmHg, the estimated right ventricular systolic pressure is 33.9 mmHg. Left Atrium: Left atrial size was normal in size. Right Atrium: Right atrial size was normal in size. Pericardium: Trivial pericardial effusion is present. The pericardial effusion is circumferential. Mitral Valve: The mitral valve is abnormal. Mild mitral valve regurgitation. No evidence of mitral valve stenosis. Tricuspid Valve: The tricuspid valve is normal in structure. Tricuspid valve regurgitation is mild . No evidence of tricuspid stenosis. Aortic Valve: The aortic valve is tricuspid. Aortic valve regurgitation is not visualized. No aortic stenosis is present. Aortic valve mean gradient measures 3.1 mmHg. Aortic valve peak gradient measures 7.1 mmHg. Aortic valve area, by VTI measures 2.02 cm. Pulmonic Valve: The pulmonic valve was not well visualized. Pulmonic valve regurgitation is not visualized. No evidence of pulmonic stenosis. Aorta: The aortic root and ascending aorta are structurally normal, with  no evidence of dilitation. Venous: The inferior vena cava is normal in size with greater than 50% respiratory variability, suggesting right atrial pressure of 3 mmHg. IAS/Shunts: No atrial level shunt detected by color flow Doppler.  LEFT VENTRICLE PLAX 2D LVIDd:         4.40 cm LVIDs:         2.70 cm LV PW:         1.30 cm LV IVS:        1.20 cm LVOT diam:     2.10 cm LV SV:         58 LV SV Index:   33 LVOT Area:     3.46 cm  RIGHT VENTRICLE             IVC RV Basal diam:  3.90 cm     IVC diam: 1.40 cm RV S prime:     15.60 cm/s TAPSE (M-mode): 2.2 cm LEFT ATRIUM           Index        RIGHT ATRIUM           Index LA diam:      3.90 cm 2.20 cm/m   RA Area:     12.70 cm LA Vol (A2C): 42.3 ml 23.87 ml/m  RA Volume:   25.10 ml  14.16 ml/m LA Vol (A4C): 58.3 ml 32.90 ml/m  AORTIC VALVE AV Area (Vmax):    2.36 cm AV Area (Vmean):   2.64 cm AV Area (VTI):     2.02 cm AV Vmax:           133.45 cm/s AV Vmean:          81.657 cm/s AV VTI:            0.288 m AV Peak Grad:      7.1 mmHg AV Mean Grad:      3.1 mmHg LVOT Vmax:         91.10 cm/s LVOT Vmean:        62.300 cm/s LVOT VTI:  0.168 m LVOT/AV VTI ratio: 0.58  AORTA Ao Root diam: 2.60 cm Ao Asc diam:  3.10 cm MITRAL VALVE               TRICUSPID VALVE MV Area (PHT): 3.53 cm    TR Peak grad:   30.9 mmHg MV Decel Time: 215 msec    TR Vmax:        278.00 cm/s MV E velocity: 70.20 cm/s MV A velocity: 97.50 cm/s  SHUNTS MV E/A ratio:  0.72        Systemic VTI:  0.17 m                            Systemic Diam: 2.10 cm Dina Rich MD Electronically signed by Dina Rich MD Signature Date/Time: 05/26/2023/2:46:41 PM    Final    DG Chest 2 View Result Date: 05/25/2023 CLINICAL DATA:  Chest pain EXAM: CHEST - 2 VIEW COMPARISON:  X-ray 05/05/2023 and older FINDINGS: Stable cardiopericardial silhouette. No consolidation, pneumothorax or effusion. No edema. Curvature and degenerative changes of the spine. Surgical changes in the right axillary region and  right upper abdomen, lower chest. Coronary stent. IMPRESSION: Chronic changes.  No acute cardiopulmonary disease. Electronically Signed   By: Karen Kays M.D.   On: 05/25/2023 13:01    Impression/Plan: -Acute on chronic anemia with maroon-colored blood per stool. -Chronic diarrhea with recent worsening of symptoms.  Currently on colestipol.  Not sure if she is getting Creon supplements. -History of lower extremity DVT and renal artery stenosis.  Currently on Eliquis. -Acute on chronic kidney disease.  Followed by nephrology.    Recommendations ------------------------- -Tentative plan for EGD and colonoscopy for evaluation of GI bleed and diarrhea on Wednesday or Thursday. -Hold Eliquis for now. -Check Fecal calprotectin -Continue other supportive care with colestipol 1 g 3 times a day, Creon as well as loperamide. -Long discussion with multiple family members at bedside today.  Kathi Der MD, FACP 05/27/2023, 11:42 AM  Contact #  [phone]     LOS: 2 days   Kathi Der  MD, FACP 05/27/2023, 9:55 AM  Contact #  [phone]

## 2023-05-27 NOTE — TOC Initial Note (Signed)
 Transition of Care [facility]) - Initial/Assessment Note    Patient Details  Name: [name] MRN: [id number] Date of Birth: 1945-05-27  Transition of Care [facility]) CM/SW Contact:    Marliss Coots, LCSW Phone Number: 05/27/2023, 1:00 PM  Clinical Narrative:                  1:00 PM Per chart review, patient has SDOH needs (social connections). CSW added sdoh resources to AVS.  Expected Discharge Plan: Home w Home Health Services Barriers to Discharge: Continued Medical Work up   Patient Goals and CMS Choice            Expected Discharge Plan and Services       Living arrangements for the past 2 months: Single Family Home                                      Prior Living Arrangements/Services Living arrangements for the past 2 months: Single Family Home Lives with:: Self Patient language and need for interpreter reviewed:: Yes        Need for Family Participation in Patient Care: Yes (Comment) Care giver support system in place?: Yes (comment)   Criminal Activity/Legal Involvement Pertinent to Current Situation/Hospitalization: No - Comment as needed  Activities of Daily Living   ADL Screening (condition at time of admission) Independently performs ADLs?: Yes (appropriate for developmental age) Is the patient deaf or have difficulty hearing?: No Does the patient have difficulty seeing, even when wearing glasses/contacts?: No Does the patient have difficulty concentrating, remembering, or making decisions?: No  Permission Sought/Granted Permission sought to share information with : Family Supports Permission granted to share information with : No (Contact information on chart)  Share Information with NAME: [name]     Permission granted to share info w Relationship: Daughter  Permission granted to share info w Contact Information: [phone]  Emotional Assessment       Orientation: : Oriented to Self, Oriented to Place, Oriented to  Situation Alcohol / Substance Use: Not Applicable Psych Involvement: No (comment)  Admission diagnosis:  SOB (shortness of breath) [R06.02] Acute on chronic congestive heart failure, unspecified heart failure type (HCC) [I50.9] Patient Active Problem List   Diagnosis Date Noted   Mitral regurgitation 09/22/2022   Tricuspid regurgitation 09/22/2022   Acute metabolic encephalopathy 07/28/2022   Anemia 02/18/2022   Pyuria, sterile 02/03/2022   Hypomagnesemia 12/26/2021   Acute lower UTI 11/05/2021   History of DVT (deep vein thrombosis) 11/05/2021   Depression 10/29/2021   Acute on chronic heart failure with preserved ejection fraction (HFpEF) (HCC) 10/24/2021   Chronic gastritis 10/19/2021   Weakness 12/02/2020   Chronic heart failure with preserved ejection fraction (HFpEF) (HCC) 12/02/2020   Elevated troponin 11/15/2020   Acute kidney injury superimposed on chronic kidney disease (HCC) 11/15/2020   Unintentional weight loss 11/15/2020   Malnutrition of moderate degree 09/27/2020   Transaminitis 09/23/2020   Acute renal failure superimposed on stage 4 chronic kidney disease (HCC) 09/03/2020   Chronic diastolic CHF (congestive heart failure) (HCC) 09/03/2020   Nausea & vomiting 09/03/2020   Dyspnea 06/28/2020   Chronic diarrhea - responds to budesonide 09/10/2019   Iron deficiency anemia 08/19/2018   Spondylosis of lumbar spine 12/24/2017   Renal vein thrombosis (HCC) 09/17/2017   Hoarse voice quality 09/09/2017   Low back pain without sciatica 09/09/2017   Hypokalemia due to excessive  gastrointestinal loss of potassium    Leukocytosis    Essential hypertension    Urinary frequency    Type 2 diabetes mellitus (HCC)    Benign essential HTN    Dysphagia    Debility    Encephalopathy    Pressure injury of skin 08/04/2017   Diffuse pulmonary alveolar hemorrhage    SOB (shortness of breath) 10/11/2016   Hyperparathyroidism, secondary renal (HCC) 05/02/2016   Gout 04/11/2015    Diabetic neuropathy associated with type 2 diabetes mellitus (HCC) 01/06/2015   SBO (small bowel obstruction) (HCC) 09/15/2013   Breast cancer of lower-inner quadrant of right female breast (HCC) 03/18/2013   Chronic venous insufficiency of lower extremity 01/06/2013   Health care maintenance 05/08/2011   Seasonal allergies 05/03/2010   Labile hypertension    CAD (coronary artery disease)    Abdominal discomfort    Anemia of chronic kidney failure, stage 4 (severe) (HCC)    History of stroke without residual deficits    Chronic pain    Adnexal mass    GERD without esophagitis 01/19/2009   Spinal stenosis, lumbar region, with neurogenic claudication 01/03/2009   PCP:  Renford Dills, MD Pharmacy:   [facility] [address] [place] [address] Phone: [phone] Fax: [phone]  [id number] ([place]), [place] - [address] [place] ([place]) [place] [address] Phone: [phone] Fax: [phone]  [facility] [address] [place] [address] Phone: [phone] Fax: [phone]     Social Drivers of Health (SDOH) Social History: SDOH Screenings   Food Insecurity: No Food Insecurity (05/25/2023)  Housing: Low Risk  (05/25/2023)  Transportation Needs: No Transportation Needs (05/25/2023)  Utilities: Not At Risk (05/25/2023)  Alcohol Screen: Low Risk  (10/25/2021)  Depression (PHQ2-9): Low Risk  (09/12/2020)  Financial Resource Strain: Low Risk  (10/25/2021)  Social Connections: Socially Isolated (05/25/2023)  Tobacco Use: Low Risk  (05/25/2023)   SDOH Interventions:     Readmission Risk Interventions    05/13/2023    4:50 PM 12/27/2021    2:10 PM 11/08/2021   12:56 PM  Readmission Risk Prevention Plan  Transportation Screening Complete Complete Complete  Medication Review (RN Care Manager) Referral to Pharmacy Complete Complete  PCP or  Specialist appointment within 3-5 days of discharge Complete Complete Complete  HRI or Home Care Consult Complete Complete   SW Recovery Care/Counseling Consult Complete Complete Complete  Palliative Care Screening Not Applicable  Not Applicable  Skilled Nursing Facility Not Applicable Not Applicable Not Applicable

## 2023-05-27 NOTE — Progress Notes (Signed)
 [facility] NEPHROLOGY PROGRESS NOTE  Assessment/ Plan: Pt is a 78 y.o. yo female  with AKI on CKD5 secondary from AoC HFpEF exacerbation.   # AKI on CKD 5 Baseline creat 3.4- 4.1 from jan - feb 2025, eGFR 11-13 ml/min, f/b Dr Signe Colt at [facility]. Creat here is 4.3, not that far from her baseline. She had significant LE edema, but clear CXR and was on RA on presentation. Pt started on IV lasix 40 bid but changed to IV 80 mg BID and added metolazone 2.5 mg daily.  - renal function unchanged from yesterday, charted UOP yesterday only 500 ml - weight improved, similar to discharge weight in Feburary  - decrease IV lasix 80 BID to 40 BID - stop metolazone 2.5 daily  #Acute on chronic HF w/ preserved EF - PTA on lasix 40, diuresis as above #HTN - diuresing as above, can resume home norvasc if needed #H/o renal vein thrombosis - on eliquis #DM2: per primary  #CAD: per primary    Subjective:   Sitting up in chair. Denies SOB except episodes when ambulating. Reports LE swelling has improved but still there.   Objective Vital signs in last 24 hours: Vitals:   05/26/23 0829 05/26/23 2005 05/27/23 0350 05/27/23 0600  BP: 125/71 (!) 156/79 (!) 163/74   Pulse:  97 81   Resp: 16 20    Temp: 97.6 F (36.4 C) 98.3 F (36.8 C) 98.3 F (36.8 C)   TempSrc: Oral Oral Oral   SpO2:  100% 94%   Weight:    71.1 kg  Height:       Weight change: 0 kg  Intake/Output Summary (Last 24 hours) at 05/27/2023 0947 Last data filed at 05/26/2023 2201 Gross per 24 hour  Intake --  Output 400 ml  Net -400 ml       Labs: RENAL PANEL Recent Labs  Lab 05/25/23 1219 05/25/23 1535 05/25/23 2240 05/26/23 0616 05/26/23 1205 05/27/23 0516  NA 140  --   --   --  140 140  K 3.9  --   --   --  4.1 4.0  CL 112*  --   --   --  112* 111  CO2 15*  --   --   --  17* 19*  GLUCOSE 94  --   --   --  97 93  BUN 88*  --   --   --  82* 89*  CREATININE 4.36*  --   --   --  3.95* 4.24*  CALCIUM 8.0*  --    --   --  8.5* 8.5*  MG  --   --  0.9* 1.9  --   --   PHOS  --   --   --   --  4.5 5.6*  ALBUMIN  --  2.5*  --   --  2.4* 2.1*    Liver Function Tests: Recent Labs  Lab 05/25/23 1535 05/26/23 1205 05/27/23 0516  AST 14*  --   --   ALT 11  --   --   ALKPHOS 91  --   --   BILITOT 0.4  --   --   PROT 6.5  --   --   ALBUMIN 2.5* 2.4* 2.1*   No results for input(s): "LIPASE", "AMYLASE" in the last 168 hours. No results for input(s): "AMMONIA" in the last 168 hours. CBC: Recent Labs    09/27/22 1354 10/11/22 1429 01/03/23 1308 01/17/23 1238 01/31/23  1437 02/13/23 1332 02/28/23 1411 03/14/23 1450 04/04/23 1445 04/04/23 1446 04/18/23 1433 04/18/23 1434 05/08/23 1833 05/13/23 0358 05/13/23 0535 05/14/23 0802 05/17/23 1149 05/25/23 1219 05/26/23 1205 05/27/23 0516  HGB 10.8*   < > 11.0*   < > 10.2*   < > 9.9*   < > 9.9*  --   --  9.4*   < >  --   --  9.1* 8.8* 7.9* 8.4* 7.6*  MCV 88.2   < > 88.7   < > 87.7   < > 85.2   < > 85.9  --   --  86.4   < >  --   --  89.2 91.0 90.5 88.9 88.2  VITAMINB12  --    < > 1,004*  --  1,098*  --  833  --  700  --   --  835  --  1,040*  --   --   --   --   --   --   FOLATE  --   --   --   --   --   --   --   --   --   --   --   --   --   --  5.3*  --   --   --   --   --   FERRITIN 231  --  61  --   --   --   --   --   --  153 226  --   --  220  --   --   --   --   --   --   TIBC  --   --   --   --   --   --   --   --   --   --   --   --   --  206*  --   --   --   --   --   --   IRON  --   --   --   --   --   --   --   --   --   --   --   --   --  46  --   --   --   --   --   --   RETICCTPCT  --   --   --   --   --   --   --   --   --   --   --   --   --  1.5  --   --   --   --   --   --    < > = values in this interval not displayed.    Cardiac Enzymes: No results for input(s): "CKTOTAL", "CKMB", "CKMBINDEX", "TROPONINI" in the last 168 hours. CBG: No results for input(s): "GLUCAP" in the last 168 hours.  Iron Studies: No results for  input(s): "IRON", "TIBC", "TRANSFERRIN", "FERRITIN" in the last 72 hours. Studies/Results: ECHOCARDIOGRAM COMPLETE Result Date: 05/26/2023    ECHOCARDIOGRAM REPORT   Patient Name:   [name] Date of Exam: 05/26/2023 Medical Rec #:  [id number]          Height:       64.0 in Accession #:    [id number]         Weight:       158.5 lb Date of Birth:  12-03-1945  BSA:          1.772 m Patient Age:    77 years           BP:           157/68 mmHg Patient Gender: F                  HR:           85 bpm. Exam Location:  Inpatient Procedure: 2D Echo, Color Doppler and Cardiac Doppler (Both Spectral and Color            Flow Doppler were utilized during procedure). Indications:    CHF-Acute Systolic  History:        Patient has prior history of Echocardiogram examinations, most                 recent 10/24/2021. CHF, CAD, Signs/Symptoms:Shortness of Breath;                 Risk Factors:Hypertension and Diabetes.  Sonographer:    [name] Sonographer#2:  [name] RDCS Referring Phys: [phone] [name] IMPRESSIONS  1. Left ventricular ejection fraction, by estimation, is 60 to 65%. The left ventricle has normal function. The left ventricle has no regional wall motion abnormalities. There is moderate left ventricular hypertrophy. Left ventricular diastolic parameters are consistent with Grade I diastolic dysfunction (impaired relaxation).  2. Right ventricular systolic function is normal. The right ventricular size is normal. There is normal pulmonary artery systolic pressure.  3. The mitral valve is abnormal. Mild mitral valve regurgitation. No evidence of mitral stenosis.  4. The aortic valve is tricuspid. Aortic valve regurgitation is not visualized. No aortic stenosis is present.  5. The inferior vena cava is normal in size with greater than 50% respiratory variability, suggesting right atrial pressure of 3 mmHg. FINDINGS  Left Ventricle: Left ventricular ejection fraction, by estimation, is 60  to 65%. The left ventricle has normal function. The left ventricle has no regional wall motion abnormalities. The left ventricular internal cavity size was normal in size. There is  moderate left ventricular hypertrophy. Left ventricular diastolic parameters are consistent with Grade I diastolic dysfunction (impaired relaxation). Right Ventricle: The right ventricular size is normal. Right vetricular wall thickness was not well visualized. Right ventricular systolic function is normal. There is normal pulmonary artery systolic pressure. The tricuspid regurgitant velocity is 2.78 m/s, and with an assumed right atrial pressure of 3 mmHg, the estimated right ventricular systolic pressure is 33.9 mmHg. Left Atrium: Left atrial size was normal in size. Right Atrium: Right atrial size was normal in size. Pericardium: Trivial pericardial effusion is present. The pericardial effusion is circumferential. Mitral Valve: The mitral valve is abnormal. Mild mitral valve regurgitation. No evidence of mitral valve stenosis. Tricuspid Valve: The tricuspid valve is normal in structure. Tricuspid valve regurgitation is mild . No evidence of tricuspid stenosis. Aortic Valve: The aortic valve is tricuspid. Aortic valve regurgitation is not visualized. No aortic stenosis is present. Aortic valve mean gradient measures 3.1 mmHg. Aortic valve peak gradient measures 7.1 mmHg. Aortic valve area, by VTI measures 2.02 cm. Pulmonic Valve: The pulmonic valve was not well visualized. Pulmonic valve regurgitation is not visualized. No evidence of pulmonic stenosis. Aorta: The aortic root and ascending aorta are structurally normal, with no evidence of dilitation. Venous: The inferior vena cava is normal in size with greater than 50% respiratory variability, suggesting right atrial pressure of 3 mmHg. IAS/Shunts: No atrial level shunt  detected by color flow Doppler.  LEFT VENTRICLE PLAX 2D LVIDd:         4.40 cm LVIDs:         2.70 cm LV PW:          1.30 cm LV IVS:        1.20 cm LVOT diam:     2.10 cm LV SV:         58 LV SV Index:   33 LVOT Area:     3.46 cm  RIGHT VENTRICLE             IVC RV Basal diam:  3.90 cm     IVC diam: 1.40 cm RV S prime:     15.60 cm/s TAPSE (M-mode): 2.2 cm LEFT ATRIUM           Index        RIGHT ATRIUM           Index LA diam:      3.90 cm 2.20 cm/m   RA Area:     12.70 cm LA Vol (A2C): 42.3 ml 23.87 ml/m  RA Volume:   25.10 ml  14.16 ml/m LA Vol (A4C): 58.3 ml 32.90 ml/m  AORTIC VALVE AV Area (Vmax):    2.36 cm AV Area (Vmean):   2.64 cm AV Area (VTI):     2.02 cm AV Vmax:           133.45 cm/s AV Vmean:          81.657 cm/s AV VTI:            0.288 m AV Peak Grad:      7.1 mmHg AV Mean Grad:      3.1 mmHg LVOT Vmax:         91.10 cm/s LVOT Vmean:        62.300 cm/s LVOT VTI:          0.168 m LVOT/AV VTI ratio: 0.58  AORTA Ao Root diam: 2.60 cm Ao Asc diam:  3.10 cm MITRAL VALVE               TRICUSPID VALVE MV Area (PHT): 3.53 cm    TR Peak grad:   30.9 mmHg MV Decel Time: 215 msec    TR Vmax:        278.00 cm/s MV E velocity: 70.20 cm/s MV A velocity: 97.50 cm/s  SHUNTS MV E/A ratio:  0.72        Systemic VTI:  0.17 m                            Systemic Diam: 2.10 cm Dina Rich MD Electronically signed by Dina Rich MD Signature Date/Time: 05/26/2023/2:46:41 PM    Final    DG Chest 2 View Result Date: 05/25/2023 CLINICAL DATA:  Chest pain EXAM: CHEST - 2 VIEW COMPARISON:  X-ray 05/05/2023 and older FINDINGS: Stable cardiopericardial silhouette. No consolidation, pneumothorax or effusion. No edema. Curvature and degenerative changes of the spine. Surgical changes in the right axillary region and right upper abdomen, lower chest. Coronary stent. IMPRESSION: Chronic changes.  No acute cardiopulmonary disease. Electronically Signed   By: Karen Kays M.D.   On: 05/25/2023 13:01    Medications: Infusions:   Scheduled Medications:  apixaban  2.5 mg Oral BID   colestipol  1 g Oral TID   FLUoxetine  20  mg Oral Daily   folic acid  1 mg Oral Daily   furosemide  80 mg Intravenous BID   lipase/protease/amylase  36,000 Units Oral TID AC   metolazone  2.5 mg Oral Daily   mirtazapine  7.5 mg Oral QHS   pantoprazole  40 mg Oral Daily   rivastigmine  1.5 mg Oral BID   sodium chloride flush  3 mL Intravenous Q12H    have reviewed scheduled and prn medications.  Physical Exam: General:NAD, comfortable sitting up in chair Heart:RRR, no JVD appreciated  Lungs:clear bilaterally, normal effort on room air  Abdomen: soft, non-distended Extremities: bilateral pitting LE edema   Rana Snare 05/27/2023,9:47 AM  LOS: 2 days

## 2023-05-28 ENCOUNTER — Inpatient Hospital Stay (HOSPITAL_COMMUNITY)

## 2023-05-28 DIAGNOSIS — R0602 Shortness of breath: Secondary | ICD-10-CM | POA: Diagnosis not present

## 2023-05-28 LAB — CBC
HCT: 24.9 % — ABNORMAL LOW (ref 36.0–46.0)
Hemoglobin: 7.9 g/dL — ABNORMAL LOW (ref 12.0–15.0)
MCH: 27.6 pg (ref 26.0–34.0)
MCHC: 31.7 g/dL (ref 30.0–36.0)
MCV: 87.1 fL (ref 80.0–100.0)
Platelets: 282 10*3/uL (ref 150–400)
RBC: 2.86 MIL/uL — ABNORMAL LOW (ref 3.87–5.11)
RDW: 18.6 % — ABNORMAL HIGH (ref 11.5–15.5)
WBC: 9.2 10*3/uL (ref 4.0–10.5)
nRBC: 0 % (ref 0.0–0.2)

## 2023-05-28 LAB — RENAL FUNCTION PANEL
Albumin: 2.4 g/dL — ABNORMAL LOW (ref 3.5–5.0)
Anion gap: 7 (ref 5–15)
BUN: 100 mg/dL — ABNORMAL HIGH (ref 8–23)
CO2: 18 mmol/L — ABNORMAL LOW (ref 22–32)
Calcium: 8.1 mg/dL — ABNORMAL LOW (ref 8.9–10.3)
Chloride: 112 mmol/L — ABNORMAL HIGH (ref 98–111)
Creatinine, Ser: 4.65 mg/dL — ABNORMAL HIGH (ref 0.44–1.00)
GFR, Estimated: 9 mL/min — ABNORMAL LOW (ref >60)
Glucose, Bld: 87 mg/dL (ref 70–99)
Phosphorus: 6.3 mg/dL — ABNORMAL HIGH (ref 2.5–4.6)
Potassium: 3.8 mmol/L (ref 3.5–5.1)
Sodium: 137 mmol/L (ref 135–145)

## 2023-05-28 LAB — IRON AND TIBC
Iron: 73 ug/dL (ref 28–170)
Saturation Ratios: 35 % — ABNORMAL HIGH (ref 10.4–31.8)
TIBC: 207 ug/dL — ABNORMAL LOW (ref 250–450)
UIBC: 134 ug/dL

## 2023-05-28 LAB — FERRITIN: Ferritin: 246 ng/mL (ref 11–307)

## 2023-05-28 MED ORDER — HEPARIN (PORCINE) 25000 UT/250ML-% IV SOLN: 1000.0000 [IU]/h | INTRAVENOUS | Status: DC

## 2023-05-28 MED ORDER — HEPARIN (PORCINE) 25000 UT/250ML-% IV SOLN
1150.0000 [IU]/h | INTRAVENOUS | Status: DC
  Administered 2023-05-28: 1000 [IU]/h via INTRAVENOUS
  Filled 2023-05-28: qty 250

## 2023-05-28 NOTE — Progress Notes (Signed)
 [facility] Progress Note  [name] 78 y.o. 12-11-1945  CC: Anemia, diarrhea   Subjective: Patient seen and examined at bedside.  Diarrhea has improved.  Only 1 bowel movement today.  Not able to collect stool sample for fecal calprotectin.  ROS : Afebrile, negative for chest pain   Objective: Vital signs in last 24 hours: Vitals:   05/28/23 0509 05/28/23 0816  BP: (!) 168/75 138/62  Pulse: 83 85  Resp: 18 17  Temp: 98.9 F (37.2 C) 98.5 F (36.9 C)  SpO2: 97%     Physical Exam:  General:   Alert,  Well-developed, well-nourished, pleasant and cooperative in NAD Lungs: No visible respiratory distress Heart:  Regular rate and rhythm; no murmurs, clicks, rubs,  or gallops. Abdomen: Soft, nontender, nondistended, bowel sound present, no peritoneal signs Lower extremity edema noted Mood and affect normal Alert and oriented x 3   Lab Results: Recent Labs    05/25/23 2240 05/26/23 0616 05/26/23 1205 05/27/23 0516 05/28/23 0459  NA  --   --    < > 140 137  K  --   --    < > 4.0 3.8  CL  --   --    < > 111 112*  CO2  --   --    < > 19* 18*  GLUCOSE  --   --    < > 93 87  BUN  --   --    < > 89* 100*  CREATININE  --   --    < > 4.24* 4.65*  CALCIUM  --   --    < > 8.5* 8.1*  MG 0.9* 1.9  --   --   --   PHOS  --   --    < > 5.6* 6.3*   < > = values in this interval not displayed.   Recent Labs    05/25/23 1535 05/26/23 1205 05/27/23 0516 05/28/23 0459  AST 14*  --   --   --   ALT 11  --   --   --   ALKPHOS 91  --   --   --   BILITOT 0.4  --   --   --   PROT 6.5  --   --   --   ALBUMIN 2.5*   < > 2.1* 2.4*   < > = values in this interval not displayed.   Recent Labs    05/27/23 0516 05/28/23 0459  WBC 8.6 9.2  HGB 7.6* 7.9*  HCT 24.6* 24.9*  MCV 88.2 87.1  PLT 263 282   No results for input(s): "LABPROT", "INR" in the last 72 hours.    Assessment/Plan: -Acute on chronic anemia with maroon-colored blood per stool. -Chronic  diarrhea with recent worsening of symptoms.  Currently on colestipol.  Not sure if she is getting Creon supplements. -History of lower extremity DVT and renal artery stenosis.  Currently on Eliquis. -Acute on chronic kidney disease.  Followed by nephrology.     Recommendations ------------------------- -Tentative plan for EGD and colonoscopy for evaluation of GI bleed and diarrhea on Thursday.  It would allow enough time for Eliquis to wean off from the system given her underlying chronic kidney disease. -Hold Eliquis for now. -Check Fecal calprotectin -Continue other supportive care with colestipol 1 g 3 times a day, Creon as well as loperamide.    Kathi Der MD, FACP 05/28/2023, 9:35 AM  Contact #  [phone]

## 2023-05-28 NOTE — Progress Notes (Signed)
 PHARMACY - ANTICOAGULATION CONSULT NOTE  Pharmacy Consult for Heparin Indication: h/o DVT/renal artery thrombosis (Eliquis pta)  Allergies  Allergen Reactions   Anesthetics, Halogenated Other (See Comments)    Per patient's daughter, she can not tolerate anesthetics.    Etomidate Other (See Comments)    Heart stopped during anesthesia   Fentanyl Other (See Comments)    Heart stopped during anesthesia    Percocet [Oxycodone-Acetaminophen] Other (See Comments)    Headaches    Diazepam Other (See Comments)    Agitation   Lorazepam Nausea Only   Tramadol Hcl Nausea And Vomiting   Haloperidol Nausea And Vomiting    **Haldol**   Morphine Sulfate Nausea Only    agitation   Propoxyphene Nausea And Vomiting    **Darvocet** no longer available   Versed [Midazolam] Other (See Comments)    Heart stopped during anesthesia    Patient Measurements: Height: 5\' 4"  (162.6 cm) Weight: 68.1 kg (150 lb 3.2 oz) IBW/kg (Calculated) : 54.7 Heparin Dosing Weight: 68 kg  Vital Signs: Temp: 98.5 F (36.9 C) (03/11 0816) Temp Source: Oral (03/11 0816) BP: 138/62 (03/11 0816) Pulse Rate: 85 (03/11 0816)  Labs: Recent Labs    05/25/23 1535 05/26/23 1205 05/26/23 1205 05/27/23 0516 05/28/23 0459  HGB  --  8.4*   < > 7.6* 7.9*  HCT  --  27.2*  --  24.6* 24.9*  PLT  --  300  --  263 282  CREATININE  --  3.95*  --  4.24* 4.65*  TROPONINIHS 23*  --   --   --   --    < > = values in this interval not displayed.    Estimated Creatinine Clearance: 9.9 mL/min (A) (by C-G formula based on SCr of 4.65 mg/dL (H)).   Medical History: Past Medical History:  Diagnosis Date   Abdominal discomfort    Chronic N/V/D. Presumptive dx Crohn's dx per elevated p ANCA. Failed Entocort and Pentasa. Sep 2003 - ileocolectomy c anastomosis per Dr Orson Slick 2/2 adhesions - path was hegative for Crohns. EGD, Sm bowel follow through (11/03), and an eteroclysis (10/03) were unrevealing. Cuases hypomag and  hypocalcemia.   Adnexal mass 10/2001   s/p lap BSO (R ovarian fibroma) & lysis of adhesions   Allergy    Seasonal   Anemia    Multifactorial. Baseline HgB 10-11 ish. B12 def - 150 in 3/10. Fe Def - ferritin 35 3/10. Both are being repleted.   Breast cancer (HCC) 03/16/2013   right, 5 o'clock   CAD (coronary artery disease) 1996   1996 - PTCA and angioplasty diagonal branch. 2000 - Rotoblator & angiopllasty of diagonal. 2006 - subendocardial AMI, DES to proximal LAD.Marland Kitchen Also had 90% stenosis in distal apical LAD. EF 55 with apical hypokinesis. Indefinite ASA and Plavix.   CHF (congestive heart failure) (HCC)    Chronic kidney disease, stage 4 (severe) (HCC)    Chronic pain    CT 10/10 = Spinal stenosis L2 - S1.   COVID-19 11/02/2020   Diabetes mellitus    Insulin dependent   Diabetes mellitus type 2 in obese 05/03/2010   Managed on lantus and novolog. Has diabetic nephropathy. Metformin D/C'd 2012 2/2 creatinine.  No diabetic retinopathy per 3/11.    Gout    Hx of radiation therapy 06/02/13- 07/16/13   right rbeast 4500 cGy 25 sessions, right breast boost 1600 cGy in 8 sessions   Hyperlipidemia    Managed with both a statin and Welchol. Welchol  stopped 2014 2/2 cost and started on fenofibrate    Hypertension    2006 B renal arteries patent. 2003 MRA - no RAS. 2003 pheo W/U Dr Caryn Section reportedly negative.   Hypoxia 07/23/2017   Lupus    Lymphedema of breast    Personal history of radiation therapy 2015   Pneumonia of left lung due to infectious organism    RBBB    Renal vein thrombosis (HCC)    SBO (small bowel obstruction) (HCC) 09/17/2017   Secondary hyperparathyroidism (HCC)    Stroke [facility])    Incidental finding MRI 2002 L lacunar infarct   Vitamin B12 deficiency    Vitamin D deficiency    Wears dentures    top    Medications:  Medications Prior to Admission  Medication Sig Dispense Refill Last Dose/Taking   acetaminophen (TYLENOL) 650 MG CR tablet Take 1,300 mg by mouth every  8 (eight) hours as needed for pain.   05/24/2023 Evening   amLODipine (NORVASC) 10 MG tablet Take 1 tablet (10 mg total) by mouth daily. 90 tablet 3 05/25/2023   apixaban (ELIQUIS) 2.5 MG TABS tablet Take 1 tablet (2.5 mg total) by mouth 2 (two) times daily. 60 tablet 0 05/25/2023 at  8:30 AM   cephALEXin (KEFLEX) 500 MG capsule Take 1 capsule (500 mg total) by mouth daily for 14 days. 14 capsule 0 05/25/2023 Morning   colestipol (COLESTID) 1 g tablet Take 1 tablet (1 g total) by mouth 3 (three) times daily. 90 tablet 2 05/25/2023 Morning   diclofenac Sodium (VOLTAREN) 1 % GEL Apply 1 Application topically 4 (four) times daily as needed (knee pain).   05/24/2023   diphenoxylate-atropine (LOMOTIL) 2.5-0.025 MG tablet Take 1 tablet by mouth 2 (two) times daily as needed for diarrhea or loose stools. 30 tablet 1 Past Week   [EXPIRED] doxycycline (VIBRAMYCIN) 100 MG capsule Take 1 capsule (100 mg total) by mouth 2 (two) times daily for 10 days. 20 capsule 0 05/25/2023 Morning   famotidine (PEPCID) 20 MG tablet Take 20 mg by mouth daily as needed for heartburn or indigestion.   Taking As Needed   FLUoxetine (PROZAC) 20 MG capsule Take 20 mg by mouth daily.   05/25/2023 Morning   folic acid (FOLVITE) 1 MG tablet Take 1 tablet (1 mg total) by mouth daily. 30 tablet 2 05/25/2023 Morning   furosemide (LASIX) 40 MG tablet Take 1 tablet (40 mg total) by mouth daily. 90 tablet 2 05/25/2023 Morning   gabapentin (NEURONTIN) 300 MG capsule Take 300 mg by mouth at bedtime.   05/24/2023 Bedtime   HYDROcodone-acetaminophen (NORCO/VICODIN) 5-325 MG tablet Take 2 tablets by mouth every 6 (six) hours as needed. 10 tablet 0 Past Month   lipase/protease/amylase (CREON) 36000 UNITS CPEP capsule Take 1 capsule (36,000 Units total) by mouth 3 (three) times daily before meals. 180 capsule 2 05/25/2023 Morning   loperamide (IMODIUM) 2 MG capsule Take 1 capsule (2 mg total) by mouth as needed for diarrhea or loose stools. 30 capsule 0 Past Week    mirtazapine (REMERON) 7.5 MG tablet Take 7.5 mg by mouth at bedtime.   05/24/2023 Bedtime   nitroGLYCERIN (NITROSTAT) 0.4 MG SL tablet DISSOLVE 1 TABLET UNDER THE TONGUE EVERY 5 MINUTES AS  NEEDED FOR CHEST PAIN. MAX  OF 3 TABLETS IN 15 MINUTES. CALL 911 IF PAIN PERSISTS. 25 tablet 0 Taking   pantoprazole (PROTONIX) 40 MG tablet Take 1 tablet (40 mg total) by mouth 2 (two) times daily. (Patient taking  differently: Take 40 mg by mouth daily.) 30 tablet 0 05/25/2023 Morning   rivastigmine (EXELON) 1.5 MG capsule Take 1 capsule (1.5 mg total) by mouth 2 (two) times daily. 60 capsule 6 05/25/2023 Morning   tiZANidine (ZANAFLEX) 2 MG tablet Take 2 mg by mouth at bedtime as needed.   Past Month    Assessment: 78 y.o. F presents with maroon colored stool. Pt on Eliquis pta for h/o lower extremity DVT and renal artery stenosis which was continued here. Last dose taken 3/10 0830. GI following and Eliquis to be on hold. To start heparin bridge and plan for EGD/colonoscopy on Thursday. Eliquis will be affecting heparin level so will utilize aPTT for monitoring until levels correlate. Hgb low but relatively stable this admission.  Goal of Therapy:  Heparin level 0.3-0.7 units/ml aPTT 66-102 seconds Monitor platelets by anticoagulation protocol: Yes   Plan:  Start heparin infusion at 1000 units/hr. No bolus. Will f/u 8 hr heparin level and aPTT Daily heparin level, aPTT, and CBC  Christoper Fabian, PharmD, BCPS Please see amion for complete clinical pharmacist phone list 05/28/2023,2:57 PM

## 2023-05-28 NOTE — TOC Progression Note (Signed)
 Transition of Care [facility]) - Progression Note    Patient Details  Name: [name] MRN: [id number] Date of Birth: [birth year]  Transition of [facility]) CM/SW Contact  Alesia Richards, RN Phone Number: [phone] 05/28/2023, 10:20 AM  Clinical Narrative:     CM alert to Dr. Dayna Barker regarding adding [facility] to home health services.   Expected Discharge Plan: Home w Home Health Services (Patient is active with [facility] for PT/HHA) Barriers to Discharge: Continued Medical Work up  Expected Discharge Plan and Services   Discharge Planning Services: CM Consult Post Acute Care Choice: Home Health Living arrangements for the past 2 months: Single Family Home   HH Arranged: PT, Nurse's Aide, Social Work, OT [employer] Agency:  [employer] [facility] phone [phone] POC: [place] Custard) Date [facility] Agency Contacted: 05/27/23 Time HH Agency Contacted: [id number] Representative spoke with at [facility] Agency: Clifton Custard   Social Determinants of Health (SDOH) Interventions SDOH Screenings   Food Insecurity: No Food Insecurity (05/25/2023)  Housing: Low Risk  (05/25/2023)  Transportation Needs: No Transportation Needs (05/25/2023)  Utilities: Not At Risk (05/25/2023)  Alcohol Screen: Low Risk  (10/25/2021)  Depression (PHQ2-9): Low Risk  (09/12/2020)  Financial Resource Strain: Low Risk  (10/25/2021)  Social Connections: Socially Isolated (05/25/2023)  Tobacco Use: Low Risk  (05/25/2023)    Readmission Risk Interventions    05/13/2023    4:50 PM 12/27/2021    2:10 PM 11/08/2021   12:56 PM  Readmission Risk Prevention Plan  Transportation Screening Complete Complete Complete  Medication Review (RN Care Manager) Referral to Pharmacy Complete Complete  PCP or Specialist appointment within 3-5 days of discharge Complete Complete Complete  HRI or Home Care Consult Complete Complete   SW Recovery Care/Counseling Consult Complete Complete Complete  Palliative Care Screening Not Applicable  Not Applicable  Skilled  Nursing Facility Not Applicable Not Applicable Not Applicable

## 2023-05-28 NOTE — Progress Notes (Signed)
 Physical Therapy Treatment Patient Details Name: [name] MRN: [id number] DOB: 1945-11-08 Today's Date: 05/28/2023   History of Present Illness Patient is a 78 yo female presenting to the ED with weight gain of 11 pounds in 1 week on 05/25/23. Admitted with SoB and AKI. Patient with multiple admissions PMH:  CKD IV, HTN, CHF, CAD, DVT, CVA, DM2, gout, chronic gastritis, breast CA, dementia, depression.    PT Comments  Pt with dementia at baseline resulting in decreased insight to deficits and safety requiring 24/7 supervision for safe function at home. Pt with improved ambulation tolerance this date at contact guard. Pt with noted bilat LE edema. Acute PT to cont to follow. Pt remains appropriate for HHPT upon d/c with use of RW and 24/7 supervision due to cognitive impairments.     If plan is discharge home, recommend the following: Supervision due to cognitive status;Direct supervision/assist for financial management;Assistance with cooking/housework;Help with stairs or ramp for entrance;Direct supervision/assist for medications management;Assist for transportation   Can travel by private vehicle        Equipment Recommendations  None recommended by PT    Recommendations for Other Services       Precautions / Restrictions Precautions Precautions: Fall Recall of Precautions/Restrictions: Impaired Precaution/Restrictions Comments: memory impairment Restrictions Weight Bearing Restrictions Per Provider Order: No     Mobility  Bed Mobility               General bed mobility comments: pt in recliner upon arrival    Transfers Overall transfer level: Needs assistance Equipment used: Rolling walker (2 wheels) Transfers: Sit to/from Stand Sit to Stand: Contact guard assist           General transfer comment: CGA for safety. Cues for hand placement. pt used hand rails in bathroom to pull self up    Ambulation/Gait Ambulation/Gait assistance: Contact guard  assist Gait Distance (Feet): 200 Feet Assistive device: Rolling walker (2 wheels) Gait Pattern/deviations: Step-through pattern Gait velocity: slightly decreased     General Gait Details: no LOB, verbal cues to stay in walker   Stairs             Wheelchair Mobility     Tilt Bed    Modified Rankin (Stroke Patients Only)       Balance Overall balance assessment: Needs assistance Sitting-balance support: Feet supported Sitting balance-Leahy Scale: Good     Standing balance support: During functional activity, Reliant on assistive device for balance Standing balance-Leahy Scale: Fair Standing balance comment: pt leaned on sink while washing hands                            Communication Communication Communication: No apparent difficulties  Cognition Arousal: Alert Behavior During Therapy: WFL for tasks assessed/performed   PT - Cognitive impairments: History of cognitive impairments, Memory, Attention                       PT - Cognition Comments: pt with hx of dementia per daughter, follows simple commands throughout session with extra time PRN. Following commands: Intact      Cueing Cueing Techniques: Verbal cues  Exercises      General Comments General comments (skin integrity, edema, etc.): VSS on RA, pt assisted to bathroom, pt indep with hygiene s/p BM. Pt furniture walked into bathroom despite verbal cues to safely take walker in bathroom      Pertinent Vitals/Pain Pain Assessment  Pain Assessment: Faces Faces Pain Scale: Hurts little more Pain Location: posterior hips during ambulation Pain Descriptors / Indicators: Sore Pain Intervention(s): Monitored during session    Home Living                          Prior Function            PT Goals (current goals can now be found in the care plan section) Acute Rehab PT Goals Patient Stated Goal: return home PT Goal Formulation: With patient Time For Goal  Achievement: 06/09/23 Potential to Achieve Goals: Good Progress towards PT goals: Progressing toward goals    Frequency    Min 2X/week      PT Plan      Co-evaluation              AM-PAC PT "6 Clicks" Mobility   Outcome Measure  Help needed turning from your back to your side while in a flat bed without using bedrails?: A Little Help needed moving from lying on your back to sitting on the side of a flat bed without using bedrails?: A Little Help needed moving to and from a bed to a chair (including a wheelchair)?: A Little Help needed standing up from a chair using your arms (e.g., wheelchair or bedside chair)?: A Little Help needed to walk in hospital room?: A Little Help needed climbing 3-5 steps with a railing? : A Little 6 Click Score: 18    End of Session Equipment Utilized During Treatment: Gait belt Activity Tolerance: Patient tolerated treatment well Patient left: in chair;with call bell/phone within reach;with chair alarm set;with family/visitor present Nurse Communication: Mobility status PT Visit Diagnosis: Muscle weakness (generalized) (M62.81);Difficulty in walking, not elsewhere classified (R26.2)     Time: 6213-0865 PT Time Calculation (min) (ACUTE ONLY): 16 min  Charges:    $Gait Training: 8-22 mins PT General Charges $$ ACUTE PT VISIT: 1 Visit                     [name], PT, DPT Acute Rehabilitation Services Secure chat preferred Office #: [phone]    [name] 05/28/2023, 2:01 PM

## 2023-05-28 NOTE — Progress Notes (Signed)
 Mobility Specialist Progress Note;   05/28/23 0930  Mobility  Activity Transferred from bed to chair  Level of Assistance Contact guard assist, steadying assist  Assistive Device Front wheel walker  Distance Ambulated (ft) 10 ft  Activity Response Tolerated well  Mobility Referral Yes  Mobility visit 1 Mobility  Mobility Specialist Start Time (ACUTE ONLY) 0930  Mobility Specialist Stop Time (ACUTE ONLY) [id number]  Mobility Specialist Time Calculation (min) (ACUTE ONLY) 8 min   Pt agreeable to mobility. Required light MinG assistance to ambulate around bed to chair. VSS throughout. C/o LLE pain while ambulating, otherwise asx. Pt left in chair with all needs met, alarm on.   Caesar Bookman Mobility Specialist Please contact via SecureChat or [employer] [phone]

## 2023-05-28 NOTE — Progress Notes (Signed)
 PROGRESS NOTE [name]  ZOX:[id number] DOB: [birth year] DOA: 05/25/2023 PCP: Renford Dills, MD  Brief Narrative/Hospital Course: 78 year old female with history of HTN, CAD, CKD stage IV lower extremity DVT/renal artery thrombosis on Eliquis, chronic HFpEF, depression/memory loss, chronic diarrhea following up with GI presented to the ED with 11 pound weight gain in [redacted] week along with lower leg edema and shortness of breath despite taking extra Lasix dose. In the ED-hemodynamically stable, on room air afebrile- felt to have volume overload BNP 336 troponin 19, hemoglobin 7.9- chest x-ray without acute cardiopulmonary finding, patient was given Lasix, nephrology consulted and admitted for further management.  Consultation: Nephrology Gastroenterology  Subjective: Seen and examined Overnight afebrile BP 130s-160s systolic, on room air  Labs with BUN/creatinine 100/4.6, hemoglobin at 7.9 up from 7.6 Weight further down to 150 pound from 156.  Assessment and Plan: Principal Problem:   SOB (shortness of breath) Active Problems:   Acute kidney injury superimposed on chronic kidney disease (HCC)   Acute on chronic heart failure with preserved ejection fraction (HFpEF) (HCC)   Benign essential HTN   CAD (coronary artery disease)   Renal vein thrombosis (HCC)   Type 2 diabetes mellitus (HCC)   Anemia   AKI on CKD stage V Metabolic acidosis: B/l creat 3.4-4.1 from jan-feb 2025 per nephrology with EGFR 11-13, patient with volume overload although chest x-ray clear and doing well on room air.  Nephrology consulted managing with IV Lasix and overall weight and swelling is improving nicely- down to 150 pound from 156. BUN/creatinine remains elevated monitor closely appreciate nephrology input, monitor daily weight. Recent Labs    05/10/23 0527 05/11/23 0412 05/12/23 0557 05/13/23 0355 05/14/23 0802 05/17/23 1149 05/25/23 1219 05/26/23 1205 05/27/23 0516 05/28/23 0459  BUN 115*  96* 81* 79* 75* 66* 88* 82* 89* 100*  CREATININE 4.53* 3.59* 3.48* 3.75* 3.95* 3.94* 4.36* 3.95* 4.24* 4.65*  CO2 16* 23 27 26 25 22  15* 17* 19* 18*  K 2.9* 3.7 4.0 4.0 4.4 4.2 3.9 4.1 4.0 3.8    Acute on chronic CHF with preserved EF Demand ischemia: BNP 336 on admission with troponin 19> 23-indicating demand ischemia from CHF.  Continue on diuresis as above per nephrology and monitor fluid status. Last echo 10/24/2021 with EF 60-65%, moderately elevated pulmonary artery systolic pressure. Repeat echo -EF 60-65%, G1 DD normal RV systolic function.  Family requesting cardiology evaluation.  Acute on chronic anemia with maroon-colored blood per stool Chronic diarrhea with recent worsening of symptoms Anemia from chronic kidney disease and folate deficiency anemia: Hb baseline around 8 to 10 g in last few months.  Last anemia panel reviewed from 05/09/2023 with normal B12 and folate deficiency-folate added.  Hemoccult test negative, due to ongoing anemia GI consulted they are planning for EGD/colonoscopy and requested to hold Eliquis 3/10. Continue colestipol and Imodium checking fecal Cal protectant  Recent Labs    01/03/23 1308 01/17/23 1238 01/31/23 1437 02/13/23 1332 02/28/23 1411 03/14/23 1450 04/04/23 1445 04/04/23 1446 04/18/23 1433 04/18/23 1434 05/08/23 1833 05/13/23 0358 05/13/23 0535 05/14/23 0802 05/17/23 1149 05/25/23 1219 05/26/23 1205 05/27/23 0516 05/28/23 0459  HGB 11.0*   < > 10.2*   < > 9.9*   < > 9.9*  --   --  9.4*   < >  --   --    < > 8.8* 7.9* 8.4* 7.6* 7.9*  MCV 88.7   < > 87.7   < > 85.2   < > 85.9  --   --  86.4   < >  --   --    < > 91.0 90.5 88.9 88.2 87.1  VITAMINB12 1,004*  --  1,098*  --  833  --  700  --   --  835  --  1,040*  --   --   --   --   --   --   --   FOLATE  --   --   --   --   --   --   --   --   --   --   --   --  5.3*  --   --   --   --   --   --   FERRITIN 61  --   --   --   --   --   --  153 226  --   --  220  --   --   --   --   --    --  246  TIBC  --   --   --   --   --   --   --   --   --   --   --  206*  --   --   --   --   --   --  207*  IRON  --   --   --   --   --   --   --   --   --   --   --  46  --   --   --   --   --   --  73  RETICCTPCT  --   --   --   --   --   --   --   --   --   --   --  1.5  --   --   --   --   --   --   --    < > = values in this interval not displayed.    History of renal thrombosis History of DVT: Holding Eliquis as above. Add heparin drip gi ok- discussed with daughter and patient too  CAD Hypertension: BP  is stable. no chest pain. Allow  BP to be on higher side for diuresis.  PTA on amlodipine-on hold for now  Diabetes mellitus: Last A1c 6.2 05/08/2023. PTA not on meds.  Monitor CBG- stable at 87. No results for input(s): "GLUCAP", "HGBA1C" in the last 168 hours.   Hypomagnesemia: Resolved  Moderate malnutrition: Augment diet as tolerated. Nutrition Problem: Moderate Malnutrition Etiology: acute illness Signs/Symptoms: mild fat depletion, mild muscle depletion, energy intake < 75% for > 7 days Interventions: Refer to RD note for recommendations   DVT prophylaxis: eluquis on hold Code Status:   Code Status: Full Code Family Communication: plan of care discussed with patient/daughter at bedside. Patient status is: Remains hospitalized because of severity of illness Level of care: Telemetry Cardiac   Dispo: The patient is from: Home            Anticipated disposition:TBD  Objective: Vitals last 24 hrs: Vitals:   05/27/23 1504 05/27/23 2023 05/28/23 0509 05/28/23 0816  BP: (!) 162/91 (!) 153/73 (!) 168/75 138/62  Pulse: 91 85 83 85  Resp: 18 18 18 17   Temp: 98.1 F (36.7 C) 98.3 F (36.8 C) 98.9 F (37.2 C) 98.5 F (36.9 C)  TempSrc: Oral Oral Oral Oral  SpO2: 100% 98% 97%  Weight:   68.1 kg   Height:       Weight change: -2.97 kg  Physical Examination: General exam: alert awake,older than stated age HEENT:Oral mucosa moist, Ear/Nose WNL  grossly Respiratory system: Bilaterally DIMINISHED BS,no use of accessory muscle Cardiovascular system: S1 & S2 +, No JVD. Gastrointestinal system: Abdomen soft,NT,ND, BS+ Nervous System: Alert, awake, moving all extremities,and following commands. Extremities: LE edema +,distal peripheral pulses palpable and warm.  Skin: No rashes,no icterus. MSK: Normal muscle bulk,tone, power   Medications reviewed:  Scheduled Meds:  colestipol  1 g Oral TID   feeding supplement  237 mL Oral BID BM   FLUoxetine  20 mg Oral Daily   folic acid  1 mg Oral Daily   furosemide  40 mg Intravenous BID   lipase/protease/amylase  36,000 Units Oral TID AC   mirtazapine  7.5 mg Oral QHS   multivitamin  1 tablet Oral QHS   pantoprazole  40 mg Oral Daily   rivastigmine  1.5 mg Oral BID   sodium bicarbonate  650 mg Oral BID   sodium chloride flush  3 mL Intravenous Q12H   thiamine  100 mg Oral Daily  Continuous Infusions:   Diet Order             Diet Heart Room service appropriate? Yes with Assist; Fluid consistency: Thin; Fluid restriction: 1200 mL Fluid  Diet effective now                  Intake/Output Summary (Last 24 hours) at 05/28/2023 1036 Last data filed at 05/28/2023 0730 Gross per 24 hour  Intake --  Output 300 ml  Net -300 ml   Net IO Since Admission: -2,521.8 mL [05/28/23 1036]  Wt Readings from Last 3 Encounters:  05/28/23 68.1 kg  05/17/23 71.1 kg  05/14/23 71.1 kg     Unresulted Labs (From admission, onward)     Start     Ordered   05/27/23 1137  Calprotectin, Fecal  Once,   R        05/27/23 1136   05/27/23 0500  CBC  Daily,   R      05/26/23 0820          Data Reviewed: I have personally reviewed following labs and imaging studies CBC: Recent Labs  Lab 05/25/23 1219 05/26/23 1205 05/27/23 0516 05/28/23 0459  WBC 9.9 8.7 8.6 9.2  HGB 7.9* 8.4* 7.6* 7.9*  HCT 25.8* 27.2* 24.6* 24.9*  MCV 90.5 88.9 88.2 87.1  PLT 258 300 263 282   Basic Metabolic Panel:   Recent Labs  Lab 05/25/23 1219 05/25/23 2240 05/26/23 0616 05/26/23 1205 05/27/23 0516 05/28/23 0459  NA 140  --   --  140 140 137  K 3.9  --   --  4.1 4.0 3.8  CL 112*  --   --  112* 111 112*  CO2 15*  --   --  17* 19* 18*  GLUCOSE 94  --   --  97 93 87  BUN 88*  --   --  82* 89* 100*  CREATININE 4.36*  --   --  3.95* 4.24* 4.65*  CALCIUM 8.0*  --   --  8.5* 8.5* 8.1*  MG  --  0.9* 1.9  --   --   --   PHOS  --   --   --  4.5 5.6* 6.3*  GFR: Estimated Creatinine Clearance: 9.9 mL/min (A) (by C-G formula based on SCr  of 4.65 mg/dL (H)).  Liver Function Tests:  Recent Labs  Lab 05/25/23 1535 05/26/23 1205 05/27/23 0516 05/28/23 0459  AST 14*  --   --   --   ALT 11  --   --   --   ALKPHOS 91  --   --   --   BILITOT 0.4  --   --   --   PROT 6.5  --   --   --   ALBUMIN 2.5* 2.4* 2.1* 2.4*   Antimicrobials/Microbiology: Anti-infectives (From admission, onward)    None         Component Value Date/Time   SDES BLOOD BLOOD LEFT ARM 07/29/2022 0236   SPECREQUEST  07/29/2022 0236    BOTTLES DRAWN AEROBIC AND ANAEROBIC Blood Culture adequate volume   CULT  07/29/2022 0236    NO GROWTH 5 DAYS Performed at [facility], [address], [place], [place] [address]    REPTSTATUS 08/03/2022 FINAL 07/29/2022 0236  Radiology Studies: ECHOCARDIOGRAM COMPLETE Result Date: 05/26/2023    ECHOCARDIOGRAM REPORT   Patient Name:   [name] Date of Exam: 05/26/2023 Medical Rec #:  [id number]          Height:       64.0 in Accession #:    [id number]         Weight:       158.5 lb Date of Birth:  1946-02-05          BSA:          1.772 m Patient Age:    77 years           BP:           157/68 mmHg Patient Gender: F                  HR:           85 bpm. Exam Location:  Inpatient Procedure: 2D Echo, Color Doppler and Cardiac Doppler (Both Spectral and Color            Flow Doppler were utilized during procedure). Indications:    CHF-Acute Systolic  History:        Patient has prior  history of Echocardiogram examinations, most                 recent 10/24/2021. CHF, CAD, Signs/Symptoms:Shortness of Breath;                 Risk Factors:Hypertension and Diabetes.  Sonographer:    Rosaland Lao Sonographer#2:  Delcie Roch RDCS Referring Phys: [phone] EKTA V PATEL IMPRESSIONS  1. Left ventricular ejection fraction, by estimation, is 60 to 65%. The left ventricle has normal function. The left ventricle has no regional wall motion abnormalities. There is moderate left ventricular hypertrophy. Left ventricular diastolic parameters are consistent with Grade I diastolic dysfunction (impaired relaxation).  2. Right ventricular systolic function is normal. The right ventricular size is normal. There is normal pulmonary artery systolic pressure.  3. The mitral valve is abnormal. Mild mitral valve regurgitation. No evidence of mitral stenosis.  4. The aortic valve is tricuspid. Aortic valve regurgitation is not visualized. No aortic stenosis is present.  5. The inferior vena cava is normal in size with greater than 50% respiratory variability, suggesting right atrial pressure of 3 mmHg. FINDINGS  Left Ventricle: Left ventricular ejection fraction, by estimation, is 60 to 65%. The left ventricle has normal function. The left ventricle has no regional  wall motion abnormalities. The left ventricular internal cavity size was normal in size. There is  moderate left ventricular hypertrophy. Left ventricular diastolic parameters are consistent with Grade I diastolic dysfunction (impaired relaxation). Right Ventricle: The right ventricular size is normal. Right vetricular wall thickness was not well visualized. Right ventricular systolic function is normal. There is normal pulmonary artery systolic pressure. The tricuspid regurgitant velocity is 2.78 m/s, and with an assumed right atrial pressure of 3 mmHg, the estimated right ventricular systolic pressure is 33.9 mmHg. Left Atrium: Left atrial size was normal  in size. Right Atrium: Right atrial size was normal in size. Pericardium: Trivial pericardial effusion is present. The pericardial effusion is circumferential. Mitral Valve: The mitral valve is abnormal. Mild mitral valve regurgitation. No evidence of mitral valve stenosis. Tricuspid Valve: The tricuspid valve is normal in structure. Tricuspid valve regurgitation is mild . No evidence of tricuspid stenosis. Aortic Valve: The aortic valve is tricuspid. Aortic valve regurgitation is not visualized. No aortic stenosis is present. Aortic valve mean gradient measures 3.1 mmHg. Aortic valve peak gradient measures 7.1 mmHg. Aortic valve area, by VTI measures 2.02 cm. Pulmonic Valve: The pulmonic valve was not well visualized. Pulmonic valve regurgitation is not visualized. No evidence of pulmonic stenosis. Aorta: The aortic root and ascending aorta are structurally normal, with no evidence of dilitation. Venous: The inferior vena cava is normal in size with greater than 50% respiratory variability, suggesting right atrial pressure of 3 mmHg. IAS/Shunts: No atrial level shunt detected by color flow Doppler.  LEFT VENTRICLE PLAX 2D LVIDd:         4.40 cm LVIDs:         2.70 cm LV PW:         1.30 cm LV IVS:        1.20 cm LVOT diam:     2.10 cm LV SV:         58 LV SV Index:   33 LVOT Area:     3.46 cm  RIGHT VENTRICLE             IVC RV Basal diam:  3.90 cm     IVC diam: 1.40 cm RV S prime:     15.60 cm/s TAPSE (M-mode): 2.2 cm LEFT ATRIUM           Index        RIGHT ATRIUM           Index LA diam:      3.90 cm 2.20 cm/m   RA Area:     12.70 cm LA Vol (A2C): 42.3 ml 23.87 ml/m  RA Volume:   25.10 ml  14.16 ml/m LA Vol (A4C): 58.3 ml 32.90 ml/m  AORTIC VALVE AV Area (Vmax):    2.36 cm AV Area (Vmean):   2.64 cm AV Area (VTI):     2.02 cm AV Vmax:           133.45 cm/s AV Vmean:          81.657 cm/s AV VTI:            0.288 m AV Peak Grad:      7.1 mmHg AV Mean Grad:      3.1 mmHg LVOT Vmax:         91.10 cm/s LVOT  Vmean:        62.300 cm/s LVOT VTI:          0.168 m LVOT/AV VTI ratio: 0.58  AORTA Ao Root  diam: 2.60 cm Ao Asc diam:  3.10 cm MITRAL VALVE               TRICUSPID VALVE MV Area (PHT): 3.53 cm    TR Peak grad:   30.9 mmHg MV Decel Time: 215 msec    TR Vmax:        278.00 cm/s MV E velocity: 70.20 cm/s MV A velocity: 97.50 cm/s  SHUNTS MV E/A ratio:  0.72        Systemic VTI:  0.17 m                            Systemic Diam: 2.10 cm Dina Rich MD Electronically signed by Dina Rich MD Signature Date/Time: 05/26/2023/2:46:41 PM    Final    LOS: 3 days  Total time spent in review of labs and imaging, patient evaluation, formulation of plan, documentation and communication with family: 35 minutes  Lanae Boast, MD  [facility]  05/28/2023, 10:36 AM

## 2023-05-28 NOTE — Plan of Care (Signed)
 Alert and oriented x4. Out of bed to chair with assistance.  Able to send off stool sample. Heparin gtt initiated today.   Problem: Education: Goal: Ability to describe self-care measures that may prevent or decrease complications (Diabetes Survival Skills Education) will improve Outcome: Progressing Goal: Individualized Educational Video(s) Outcome: Progressing   Problem: Coping: Goal: Ability to adjust to condition or change in health will improve Outcome: Progressing   Problem: Fluid Volume: Goal: Ability to maintain a balanced intake and output will improve Outcome: Progressing   Problem: Health Behavior/Discharge Planning: Goal: Ability to identify and utilize available resources and services will improve Outcome: Progressing Goal: Ability to manage health-related needs will improve Outcome: Progressing   Problem: Metabolic: Goal: Ability to maintain appropriate glucose levels will improve Outcome: Progressing

## 2023-05-28 NOTE — Progress Notes (Signed)
 [name] NEPHROLOGY PROGRESS NOTE  Assessment/ Plan: Pt is a 78 y.o. yo female  with AKI on CKD5 secondary from AoC HFpEF exacerbation.   # AKI on CKD 5 Baseline creat 3.4- 4.1 from jan - feb 2025, eGFR 11-13 ml/min, f/b Dr Signe Colt at [facility]. Creat here is 4.3, not that far from her baseline. She had significant LE edema, but clear CXR and was on RA on presentation. Pt started on IV lasix 40 bid but changed to IV 80 mg BID and added metolazone 2.5 mg daily.   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 Has SOB at times. Reports LE swelling has improved.   Objective Vital signs in last 24 hours: Vitals:   05/27/23 1504 05/27/23 2023 05/28/23 0509 05/28/23 0816  BP: (!) 162/91 (!) 153/73 (!) 168/75 138/62  Pulse: 91 85 83 85  Resp: 18 18 18 17   Temp: 98.1 F (36.7 C) 98.3 F (36.8 C) 98.9 F (37.2 C) 98.5 F (36.9 C)  TempSrc: Oral Oral Oral Oral  SpO2: 100% 98% 97%   Weight:   68.1 kg   Height:       Weight change: -2.97 kg  Intake/Output Summary (Last 24 hours) at 05/28/2023 0918 Last data filed at 05/28/2023 0730 Gross per 24 hour  Intake --  Output 300 ml  Net -300 ml       Labs: RENAL PANEL Recent Labs  Lab 05/25/23 1219 05/25/23 1535 05/25/23 2240 05/26/23 0616 05/26/23 1205 05/27/23 0516 05/28/23 0459  NA 140  --   --   --  140 140 137  K 3.9  --   --   --  4.1 4.0 3.8  CL 112*  --   --   --  112* 111 112*  CO2 15*  --   --   --  17* 19* 18*  GLUCOSE 94  --   --   --  97 93  87  BUN 88*  --   --   --  82* 89* 100*  CREATININE 4.36*  --   --   --  3.95* 4.24* 4.65*  CALCIUM 8.0*  --   --   --  8.5* 8.5* 8.1*  MG  --   --  0.9* 1.9  --   --   --   PHOS  --   --   --   --  4.5 5.6* 6.3*  ALBUMIN  --  2.5*  --   --  2.4* 2.1* 2.4*    Liver Function Tests: Recent Labs  Lab 05/25/23 1535 05/26/23 1205 05/27/23 0516 05/28/23 0459  AST 14*  --   --   --   ALT 11  --   --   --   ALKPHOS 91  --   --   --   BILITOT 0.4  --   --   --   PROT 6.5  --   --   --  ALBUMIN 2.5* 2.4* 2.1* 2.4*   No results for input(s): "LIPASE", "AMYLASE" in the last 168 hours. No results for input(s): "AMMONIA" in the last 168 hours. CBC: Recent Labs    01/03/23 1308 01/17/23 1238 01/31/23 1437 02/13/23 1332 02/28/23 1411 03/14/23 1450 04/04/23 1445 04/04/23 1446 04/18/23 1433 04/18/23 1434 05/08/23 1833 05/13/23 0358 05/13/23 0535 05/14/23 0802 05/17/23 1149 05/25/23 1219 05/26/23 1205 05/27/23 0516 05/28/23 0459  HGB 11.0*   < > 10.2*   < > 9.9*   < > 9.9*  --   --  9.4*   < >  --   --    < > 8.8* 7.9* 8.4* 7.6* 7.9*  MCV 88.7   < > 87.7   < > 85.2   < > 85.9  --   --  86.4   < >  --   --    < > 91.0 90.5 88.9 88.2 87.1  VITAMINB12 1,004*  --  1,098*  --  833  --  700  --   --  835  --  1,040*  --   --   --   --   --   --   --   FOLATE  --   --   --   --   --   --   --   --   --   --   --   --  5.3*  --   --   --   --   --   --   FERRITIN 61  --   --   --   --   --   --  153 226  --   --  220  --   --   --   --   --   --  246  TIBC  --   --   --   --   --   --   --   --   --   --   --  206*  --   --   --   --   --   --  207*  IRON  --   --   --   --   --   --   --   --   --   --   --  46  --   --   --   --   --   --  73  RETICCTPCT  --   --   --   --   --   --   --   --   --   --   --  1.5  --   --   --   --   --   --   --    < > = values in this interval not displayed.    Cardiac Enzymes: No results for input(s): "CKTOTAL", "CKMB", "CKMBINDEX", "TROPONINI" in  the last 168 hours. CBG: No results for input(s): "GLUCAP" in the last 168 hours.  Iron Studies:  Recent Labs    05/28/23 0459  IRON 73  TIBC 207*  FERRITIN 246   Studies/Results: ECHOCARDIOGRAM COMPLETE Result Date: 05/26/2023    ECHOCARDIOGRAM REPORT   Patient Name:   [name] Date of Exam: 05/26/2023 Medical Rec #:  [id number]          Height:       64.0 in Accession #:    [id number]  Weight:       158.5 lb Date of Birth:  10/04/1945          BSA:          1.772 m Patient Age:    77 years           BP:           157/68 mmHg Patient Gender: F                  HR:           85 bpm. Exam Location:  Inpatient Procedure: 2D Echo, Color Doppler and Cardiac Doppler (Both Spectral and Color            Flow Doppler were utilized during procedure). Indications:    CHF-Acute Systolic  History:        Patient has prior history of Echocardiogram examinations, most                 recent 10/24/2021. CHF, CAD, Signs/Symptoms:Shortness of Breath;                 Risk Factors:Hypertension and Diabetes.  Sonographer:    Rosaland Lao Sonographer#2:  Delcie Roch RDCS Referring Phys: [phone] EKTA V PATEL IMPRESSIONS  1. Left ventricular ejection fraction, by estimation, is 60 to 65%. The left ventricle has normal function. The left ventricle has no regional wall motion abnormalities. There is moderate left ventricular hypertrophy. Left ventricular diastolic parameters are consistent with Grade I diastolic dysfunction (impaired relaxation).  2. Right ventricular systolic function is normal. The right ventricular size is normal. There is normal pulmonary artery systolic pressure.  3. The mitral valve is abnormal. Mild mitral valve regurgitation. No evidence of mitral stenosis.  4. The aortic valve is tricuspid. Aortic valve regurgitation is not visualized. No aortic stenosis is present.  5. The inferior vena cava is normal in size with greater than 50% respiratory variability, suggesting right atrial  pressure of 3 mmHg. FINDINGS  Left Ventricle: Left ventricular ejection fraction, by estimation, is 60 to 65%. The left ventricle has normal function. The left ventricle has no regional wall motion abnormalities. The left ventricular internal cavity size was normal in size. There is  moderate left ventricular hypertrophy. Left ventricular diastolic parameters are consistent with Grade I diastolic dysfunction (impaired relaxation). Right Ventricle: The right ventricular size is normal. Right vetricular wall thickness was not well visualized. Right ventricular systolic function is normal. There is normal pulmonary artery systolic pressure. The tricuspid regurgitant velocity is 2.78 m/s, and with an assumed right atrial pressure of 3 mmHg, the estimated right ventricular systolic pressure is 33.9 mmHg. Left Atrium: Left atrial size was normal in size. Right Atrium: Right atrial size was normal in size. Pericardium: Trivial pericardial effusion is present. The pericardial effusion is circumferential. Mitral Valve: The mitral valve is abnormal. Mild mitral valve regurgitation. No evidence of mitral valve stenosis. Tricuspid Valve: The tricuspid valve is normal in structure. Tricuspid valve regurgitation is mild . No evidence of tricuspid stenosis. Aortic Valve: The aortic valve is tricuspid. Aortic valve regurgitation is not visualized. No aortic stenosis is present. Aortic valve mean gradient measures 3.1 mmHg. Aortic valve peak gradient measures 7.1 mmHg. Aortic valve area, by VTI measures 2.02 cm. Pulmonic Valve: The pulmonic valve was not well visualized. Pulmonic valve regurgitation is not visualized. No evidence of pulmonic stenosis. Aorta: The aortic root and ascending aorta are structurally normal, with no evidence of dilitation. Venous: The inferior vena  cava is normal in size with greater than 50% respiratory variability, suggesting right atrial pressure of 3 mmHg. IAS/Shunts: No atrial level shunt detected by  color flow Doppler.  LEFT VENTRICLE PLAX 2D LVIDd:         4.40 cm LVIDs:         2.70 cm LV PW:         1.30 cm LV IVS:        1.20 cm LVOT diam:     2.10 cm LV SV:         58 LV SV Index:   33 LVOT Area:     3.46 cm  RIGHT VENTRICLE             IVC RV Basal diam:  3.90 cm     IVC diam: 1.40 cm RV S prime:     15.60 cm/s TAPSE (M-mode): 2.2 cm LEFT ATRIUM           Index        RIGHT ATRIUM           Index LA diam:      3.90 cm 2.20 cm/m   RA Area:     12.70 cm LA Vol (A2C): 42.3 ml 23.87 ml/m  RA Volume:   25.10 ml  14.16 ml/m LA Vol (A4C): 58.3 ml 32.90 ml/m  AORTIC VALVE AV Area (Vmax):    2.36 cm AV Area (Vmean):   2.64 cm AV Area (VTI):     2.02 cm AV Vmax:           133.45 cm/s AV Vmean:          81.657 cm/s AV VTI:            0.288 m AV Peak Grad:      7.1 mmHg AV Mean Grad:      3.1 mmHg LVOT Vmax:         91.10 cm/s LVOT Vmean:        62.300 cm/s LVOT VTI:          0.168 m LVOT/AV VTI ratio: 0.58  AORTA Ao Root diam: 2.60 cm Ao Asc diam:  3.10 cm MITRAL VALVE               TRICUSPID VALVE MV Area (PHT): 3.53 cm    TR Peak grad:   30.9 mmHg MV Decel Time: 215 msec    TR Vmax:        278.00 cm/s MV E velocity: 70.20 cm/s MV A velocity: 97.50 cm/s  SHUNTS MV E/A ratio:  0.72        Systemic VTI:  0.17 m                            Systemic Diam: 2.10 cm Dina Rich MD Electronically signed by Dina Rich MD Signature Date/Time: 05/26/2023/2:46:41 PM    Final     Medications: Infusions:   Scheduled Medications:  colestipol  1 g Oral TID   feeding supplement  237 mL Oral BID BM   FLUoxetine  20 mg Oral Daily   folic acid  1 mg Oral Daily   furosemide  40 mg Intravenous BID   lipase/protease/amylase  36,000 Units Oral TID AC   mirtazapine  7.5 mg Oral QHS   multivitamin  1 tablet Oral QHS   pantoprazole  40 mg Oral Daily   rivastigmine  1.5 mg Oral BID   sodium  bicarbonate  650 mg Oral BID   sodium chloride flush  3 mL Intravenous Q12H   thiamine  100 mg Oral Daily    have  reviewed scheduled and prn medications.  Physical Exam: General:NAD, comfortable sitting up in bed Heart:RRR Lungs:clear bilaterally, normal effort on room air  Abdomen: soft, non-distended Extremities: b/l LE edema   Rana Snare 05/28/2023,9:18 AM  LOS: 3 days

## 2023-05-28 NOTE — Consult Note (Addendum)
 Cardiology Consultation   Patient ID: [name] MRN: [id number]; DOB: 17-Sep-1945  Admit date: 05/25/2023 Date of Consult: 05/28/2023  PCP:  Renford Dills, MD   [facility] Providers Cardiologist:  Charlton Haws, MD   {    Patient Profile:   [name] is a 78 y.o. female with a hx of HTN, CAD, DM, CKD stage V lower extremity DVT/renal artery thrombosis on Eliquis, chronic HFpEF, depression/memory loss, chronic diarrhea who is being seen 05/28/2023 for the evaluation of CHF at the request of Dr. Lanae Boast.  History of Present Illness:   [name] 3 YOF with above medical history.  Last seen 10/29/2021 during hospitalization by cardiology for acute on chronic diastolic heart failure complicated by hyperkalemia. She was d/c with torsemide 40 mg, Potassium , and follow up BMP in 1 week. She has been lost to follow up since.   Presented to the ED 05/25/23 with 11 pounds weight gain in [redacted] week along with lower leg edema and SOB despite taking extra Lasix doses.  On exam in the ED he was hemodynamically stable, on room air, afebrile, and volume overloaded. Patient was given IV lasix. .  Na 140, K 3.9, CL 112, CO2 15, Gluc 94, BUN 88, Ca 8 CR: 4.36 >3.95 > 4.24 > 4.65  BNP 336, hsTN 19 > 23 Anemia w/ Hgb of 7.9 & HCT 25.8 EKG: NSR, HR 89 with RBBB and PVC's (Similar to previous EKG) CXR: No acute cardiopulmonary disease.  ECHO 05/26/23: LVEF 60 to 65%.  No RWMA. moderate LVH.  G1 DD.  Mild MVR. (Unchanged from previous)  Nephrology was consulted. They treated with IV diuretics and metolazone with increased urine output on the first day but marginal urine output after that.  Creatinine and BUN remained elevated.  After continuing to review labs and discussion with daughter, nephrology is planning to initiate dialysis and discontinued Lasix.   Cardiology was consulted. Patient is laying in bed with daughter bedside, Porfirio Mylar, who called other daughter on phone,  [name]. According to daughters, she had apt w/ PCP on 05/23/23 and she was fine without edema or SOB that there noticed. Per Daughters, patient is cognitively impaired and limited on information she can provide. They noticed 3+ pitting edema and SOB start on 05/24/23. SOB is assocated with minimial exertion and not relieved by rest. Daughter showed me a picture of edema and I agree with 3+ pitting, which has significantly improved compared to today's exam. Denies orthopnea, PND, chest pain, dizziness. She has been compliant with medication. Admits to poor diet and denies smoking, ETOH, or drug use. Daughters have spoken with Nephrology  and agree to proceed with Dialysis. However, they wanted to follow up with cardiology to see if anything cardiac was contributing to her recent s/s and changes renal function labs.   Past Medical History:  Diagnosis Date   Abdominal discomfort    Chronic N/V/D. Presumptive dx Crohn's dx per elevated p ANCA. Failed Entocort and Pentasa. Sep 2003 - ileocolectomy c anastomosis per Dr Orson Slick 2/2 adhesions - path was hegative for Crohns. EGD, Sm bowel follow through (11/03), and an eteroclysis (10/03) were unrevealing. Cuases hypomag and hypocalcemia.   Adnexal mass 10/2001   s/p lap BSO (R ovarian fibroma) & lysis of adhesions   Allergy    Seasonal   Anemia    Multifactorial. Baseline HgB 10-11 ish. B12 def - 150 in 3/10. Fe Def - ferritin 35 3/10. Both are being repleted.  Breast cancer (HCC) 03/16/2013   right, 5 o'clock   CAD (coronary artery disease) 1996   1996 - PTCA and angioplasty diagonal branch. 2000 - Rotoblator & angiopllasty of diagonal. 2006 - subendocardial AMI, DES to proximal LAD.Marland Kitchen Also had 90% stenosis in distal apical LAD. EF 55 with apical hypokinesis. Indefinite ASA and Plavix.   CHF (congestive heart failure) (HCC)    Chronic kidney disease, stage 4 (severe) (HCC)    Chronic pain    CT 10/10 = Spinal stenosis L2 - S1.   COVID-19 11/02/2020    Diabetes mellitus    Insulin dependent   Diabetes mellitus type 2 in obese 05/03/2010   Managed on lantus and novolog. Has diabetic nephropathy. Metformin D/C'd 2012 2/2 creatinine.  No diabetic retinopathy per 3/11.    Gout    Hx of radiation therapy 06/02/13- 07/16/13   right rbeast 4500 cGy 25 sessions, right breast boost 1600 cGy in 8 sessions   Hyperlipidemia    Managed with both a statin and Welchol. Welchol stopped 2014 2/2 cost and started on fenofibrate    Hypertension    2006 B renal arteries patent. 2003 MRA - no RAS. 2003 pheo W/U Dr Caryn Section reportedly negative.   Hypoxia 07/23/2017   Lupus    Lymphedema of breast    Personal history of radiation therapy 2015   Pneumonia of left lung due to infectious organism    RBBB    Renal vein thrombosis (HCC)    SBO (small bowel obstruction) (HCC) 09/17/2017   Secondary hyperparathyroidism (HCC)    Stroke (HCC)    Incidental finding MRI 2002 L lacunar infarct   Vitamin B12 deficiency    Vitamin D deficiency    Wears dentures    top    Past Surgical History:  Procedure Laterality Date   ABDOMINAL HYSTERECTOMY     BILATERAL SALPINGOOPHORECTOMY  8/03   Lap BSO (R ovarian fibroma) and adhesion lysis   BIOPSY  09/08/2020   Procedure: BIOPSY;  Surgeon: Bernette Redbird, MD;  Location: [facility];  Service: Endoscopy;;   BOWEL RESECTION  2003   ileocolectomy with anastomosis 2/2 adhesions   BREAST BIOPSY Right 2015   BREAST BIOPSY Right 07/2014   BREAST LUMPECTOMY Right 04/22/2013   BREAST LUMPECTOMY WITH NEEDLE LOCALIZATION AND AXILLARY SENTINEL LYMPH NODE BX Right 04/22/2013   Procedure: BREAST LUMPECTOMY WITH NEEDLE LOCALIZATION AND AXILLARY SENTINEL LYMPH NODE BX;  Surgeon: Almond Lint, MD;  Location: [facility];  Service: General;  Laterality: Right;   CARDIAC CATHETERIZATION     2 stents   CHOLECYSTECTOMY     COLONOSCOPY     ESOPHAGOGASTRODUODENOSCOPY N/A 09/08/2020   Procedure: ESOPHAGOGASTRODUODENOSCOPY (EGD);   Surgeon: Bernette Redbird, MD;  Location: [facility];  Service: Endoscopy;  Laterality: N/A;   HEMICOLECTOMY     R sided hemicolectomy   HERNIA REPAIR     Ventral hernia repair   PTCA  4/06     Home Medications:  Prior to Admission medications   Medication Sig Start Date End Date Taking? Authorizing Provider  acetaminophen (TYLENOL) 650 MG CR tablet Take 1,300 mg by mouth every 8 (eight) hours as needed for pain.   Yes [provider]  amLODipine (NORVASC) 10 MG tablet Take 1 tablet (10 mg total) by mouth daily. 10/26/22  Yes   apixaban (ELIQUIS) 2.5 MG TABS tablet Take 1 tablet (2.5 mg total) by mouth 2 (two) times daily. 10/30/21  Yes Arrien, York Ram, MD  [facility]) 500  MG capsule Take 1 capsule (500 mg total) by mouth daily for 14 days. 05/17/23 05/31/23 Yes Caron Presume, MD  colestipol (COLESTID) 1 g tablet Take 1 tablet (1 g total) by mouth 3 (three) times daily. 05/14/23  Yes Deanna Artis, DO  diclofenac Sodium (VOLTAREN) 1 % GEL Apply 1 Application topically 4 (four) times daily as needed (knee pain).   Yes [provider]  diphenoxylate-atropine (LOMOTIL) 2.5-0.025 MG tablet Take 1 tablet by mouth 2 (two) times daily as needed for diarrhea or loose stools. 05/14/23 05/13/24 Yes Deanna Artis, DO  famotidine (PEPCID) 20 MG tablet Take 20 mg by mouth daily as needed for heartburn or indigestion. 10/14/21  Yes [provider]  FLUoxetine (PROZAC) 20 MG capsule Take 20 mg by mouth daily. 05/05/23  Yes [provider]  folic acid (FOLVITE) 1 MG tablet Take 1 tablet (1 mg total) by mouth daily. 05/14/23 05/13/24 Yes Deanna Artis, DO  furosemide (LASIX) 40 MG tablet Take 1 tablet (40 mg total) by mouth daily. 04/09/23  Yes   gabapentin (NEURONTIN) 300 MG capsule Take 300 mg by mouth at bedtime. 05/02/23  Yes [provider]  HYDROcodone-acetaminophen (NORCO/VICODIN) 5-325 MG tablet Take 2 tablets by mouth every 6 (six) hours as  needed. 05/17/23  Yes Caron Presume, MD  lipase/protease/amylase (CREON) 36000 UNITS CPEP capsule Take 1 capsule (36,000 Units total) by mouth 3 (three) times daily before meals. 05/14/23  Yes Deanna Artis, DO  loperamide (IMODIUM) 2 MG capsule Take 1 capsule (2 mg total) by mouth as needed for diarrhea or loose stools. 05/14/23  Yes Deanna Artis, DO  mirtazapine (REMERON) 7.5 MG tablet Take 7.5 mg by mouth at bedtime. 04/22/23  Yes [provider]  nitroGLYCERIN (NITROSTAT) 0.4 MG SL tablet DISSOLVE 1 TABLET UNDER THE TONGUE EVERY 5 MINUTES AS  NEEDED FOR CHEST PAIN. MAX  OF 3 TABLETS IN 15 MINUTES. CALL 911 IF PAIN PERSISTS. 10/04/20  Yes Jake Bathe, MD  pantoprazole (PROTONIX) 40 MG tablet Take 1 tablet (40 mg total) by mouth 2 (two) times daily. Patient taking differently: Take 40 mg by mouth daily. 12/23/21  Yes Jacalyn Lefevre, MD  rivastigmine (EXELON) 1.5 MG capsule Take 1 capsule (1.5 mg total) by mouth 2 (two) times daily. 12/27/22 07/25/23 Yes Camara, Amalia Hailey, MD  tiZANidine (ZANAFLEX) 2 MG tablet Take 2 mg by mouth at bedtime as needed. 04/09/23  Yes [provider]    Inpatient Medications: Scheduled Meds:  colestipol  1 g Oral TID   feeding supplement  237 mL Oral BID BM   FLUoxetine  20 mg Oral Daily   folic acid  1 mg Oral Daily   lipase/protease/amylase  36,000 Units Oral TID AC   mirtazapine  7.5 mg Oral QHS   multivitamin  1 tablet Oral QHS   pantoprazole  40 mg Oral Daily   rivastigmine  1.5 mg Oral BID   sodium bicarbonate  650 mg Oral BID   sodium chloride flush  3 mL Intravenous Q12H   thiamine  100 mg Oral Daily   Continuous Infusions:  PRN Meds: hydrALAZINE, loperamide, nitroGLYCERIN  Allergies:    Allergies  Allergen Reactions   Anesthetics, Halogenated Other (See Comments)    Per patient's daughter, she can not tolerate anesthetics.    Etomidate Other (See Comments)    Heart stopped during anesthesia   Fentanyl Other (See  Comments)    Heart stopped during anesthesia  Percocet [Oxycodone-Acetaminophen] Other (See Comments)    Headaches    Diazepam Other (See Comments)    Agitation   Lorazepam Nausea Only   Tramadol Hcl Nausea And Vomiting   Haloperidol Nausea And Vomiting    **Haldol**   Morphine Sulfate Nausea Only    agitation   Propoxyphene Nausea And Vomiting    **Darvocet** no longer available   Versed [Midazolam] Other (See Comments)    Heart stopped during anesthesia    Social History:   Social History   Socioeconomic History   Marital status: Widowed    Spouse name: Not on file   Number of children: 5   Years of education: Not on file   Highest education level: High school graduate  Occupational History   Occupation: retired    Associate Professor: [employer]  Tobacco Use   Smoking status: Never   Smokeless tobacco: Never  Vaping Use   Vaping status: Never Used  Substance and Sexual Activity   Alcohol use: No    Alcohol/week: 0.0 standard drinks of alcohol   Drug use: No   Sexual activity: Not on file  Other Topics Concern   Not on file  Social History Narrative   She is separated 2 sons 3 daughters   She has grandchildren and great-grandchildren   She is retired   Never smoker no tobacco now no drugs no alcohol no caffeine   Right handed   Social Drivers of Corporate investment banker Strain: Low Risk  (10/25/2021)   Overall Financial Resource Strain (CARDIA)    Difficulty of Paying Living Expenses: Not very hard  Food Insecurity: No Food Insecurity (05/25/2023)   Hunger Vital Sign    Worried About Running Out of Food in the Last Year: Never true    Ran Out of Food in the Last Year: Never true  Transportation Needs: No Transportation Needs (05/25/2023)   PRAPARE - Administrator, Civil Service (Medical): No    Lack of Transportation (Non-Medical): No  Physical Activity: Not on file  Stress: Not on file  Social Connections: Socially Isolated (05/25/2023)    Social Connection and Isolation Panel [NHANES]    Frequency of Communication with Friends and Family: More than three times a week    Frequency of Social Gatherings with Friends and Family: Three times a week    Attends Religious Services: Never    Active Member of Clubs or Organizations: No    Attends Banker Meetings: Never    Marital Status: Separated  Intimate Partner Violence: Not At Risk (05/25/2023)   Humiliation, Afraid, Rape, and Kick questionnaire    Fear of Current or Ex-Partner: No    Emotionally Abused: No    Physically Abused: No    Sexually Abused: No    Family History:    Family History  Problem Relation Age of Onset   Breast cancer Mother 69   Pancreatic cancer Brother 16   Lung cancer Maternal Aunt    Breast cancer Maternal Aunt    Kidney failure Maternal Aunt    Prostate cancer Maternal Uncle    Heart attack Maternal Grandmother    Breast cancer Maternal Grandmother    Colon cancer Maternal Grandmother    Clotting disorder Maternal Grandmother    Diabetes Maternal Grandmother    Hypertension Daughter    Pancreatic cancer Other      ROS:  Please see the history of present illness.   All other ROS reviewed and negative.  Physical Exam/Data:   Vitals:   05/27/23 1504 05/27/23 2023 05/28/23 0509 05/28/23 0816  BP: (!) 162/91 (!) 153/73 (!) 168/75 138/62  Pulse: 91 85 83 85  Resp: 18 18 18 17   Temp: 98.1 F (36.7 C) 98.3 F (36.8 C) 98.9 F (37.2 C) 98.5 F (36.9 C)  TempSrc: Oral Oral Oral Oral  SpO2: 100% 98% 97%   Weight:   68.1 kg   Height:        Intake/Output Summary (Last 24 hours) at 05/28/2023 1452 Last data filed at 05/28/2023 1406 Gross per 24 hour  Intake 3 ml  Output 750 ml  Net -747 ml      05/28/2023    5:09 AM 05/27/2023    6:00 AM 05/26/2023    6:21 AM  Last 3 Weights  Weight (lbs) 150 lb 3.2 oz 156 lb 12 oz 158 lb 8.2 oz  Weight (kg) 68.13 kg 71.1 kg 71.9 kg     Body mass index is 25.78 kg/m.  General:   Laying in bed in no acute distress with daughter at bedside. HEENT: normal Neck: no JVD Vascular: No carotid bruits; Distal pulses 2+ bilaterally Cardiac:  normal S1, S2; RRR; no murmur; +2 radial  Lungs:  clear to auscultation bilaterally, no wheezing, rhonchi or rales  Abd: soft, nontender, no hepatomegaly  Ext: +1  ededma Musculoskeletal:  No deformities, BUE and BLE strength normal and equal Skin: warm and dry  Neuro:  CNs 2-12 intact, no focal abnormalities noted Psych:  Normal affect   EKG:  The EKG was personally reviewed and demonstrates:  NSR, HR 89 with RBBB and PVC's  Telemetry:  Telemetry was personally reviewed and demonstrates:  NSR 80-90's with runs of trigeminy and occasional PVC.    Relevant CV Studies:  Nuc study 02/27/18 Addendum by Quintella Reichert, MD on Sun Mar 02, 2018  7:58 PM  The left ventricular ejection fraction is moderately decreased (30-44%). Nuclear stress EF: 37%. There was no ST segment deviation noted during stress. Study is of very poor quality due to evere extracardiac tracer uptake Recommend repeat stress images or consider coronary CTA    Finalized by Quintella Reichert, MD on Sun Mar 02, 2018  7:57 PM  The left ventricular ejection fraction is moderately decreased (30-44%). Nuclear stress EF: 37%. There was no ST segment deviation noted during stress.    Echo 09/04/18   IMPRESSIONS  1. The left ventricle has normal systolic function with an ejection fraction of 60-65%. The cavity size was normal. There is moderate asymmetric left ventricular hypertrophy. Left ventricular diastolic Doppler parameters are consistent with impaired  relaxation. Indeterminate filling pressures The E/e' is 8-15. No evidence of left ventricular regional wall motion abnormalities.  2. The right ventricle has normal systolic function. The cavity was normal. There is no increase in right ventricular wall thickness.  3. The mitral valve is abnormal. Mild thickening of the  mitral valve leaflet.  4. The tricuspid valve is grossly normal.  5. The aortic valve is grossly normal. No stenosis of the aortic valve.  6. The average left ventricular global longitudinal strain is -19.6 %.  ECHO IMPRESSIONS 05/26/2023  1. Left ventricular ejection fraction, by estimation, is 60 to 65%. The  left ventricle has normal function. The left ventricle has no regional  wall motion abnormalities. There is moderate left ventricular hypertrophy.  Left ventricular diastolic parameters are consistent with Grade I diastolic dysfunction (impaired relaxation).   2. Right ventricular systolic  function is normal. The right ventricular  size is normal. There is normal pulmonary artery systolic pressure.   3. The mitral valve is abnormal. Mild mitral valve regurgitation. No  evidence of mitral stenosis.   4. The aortic valve is tricuspid. Aortic valve regurgitation is not  visualized. No aortic stenosis is present.   5. The inferior vena cava is normal in size with greater than 50%  respiratory variability, suggesting right atrial pressure of 3 mmHg.    Laboratory Data:  High Sensitivity Troponin:   Recent Labs  Lab 05/25/23 1219 05/25/23 1535  TROPONINIHS 19* 23*     Chemistry Recent Labs  Lab 05/25/23 2240 05/26/23 0616 05/26/23 1205 05/27/23 0516 05/28/23 0459  NA  --   --  140 140 137  K  --   --  4.1 4.0 3.8  CL  --   --  112* 111 112*  CO2  --   --  17* 19* 18*  GLUCOSE  --   --  97 93 87  BUN  --   --  82* 89* 100*  CREATININE  --   --  3.95* 4.24* 4.65*  CALCIUM  --   --  8.5* 8.5* 8.1*  MG 0.9* 1.9  --   --   --   GFRNONAA  --   --  11* 10* 9*  ANIONGAP  --   --  11 10 7     Recent Labs  Lab 05/25/23 1535 05/26/23 1205 05/27/23 0516 05/28/23 0459  PROT 6.5  --   --   --   ALBUMIN 2.5* 2.4* 2.1* 2.4*  AST 14*  --   --   --   ALT 11  --   --   --   ALKPHOS 91  --   --   --   BILITOT 0.4  --   --   --    Lipids No results for input(s): "CHOL", "TRIG",  "HDL", "LABVLDL", "LDLCALC", "CHOLHDL" in the last 168 hours.  Hematology Recent Labs  Lab 05/26/23 1205 05/27/23 0516 05/28/23 0459  WBC 8.7 8.6 9.2  RBC 3.06* 2.79* 2.86*  HGB 8.4* 7.6* 7.9*  HCT 27.2* 24.6* 24.9*  MCV 88.9 88.2 87.1  MCH 27.5 27.2 27.6  MCHC 30.9 30.9 31.7  RDW 18.8* 18.9* 18.6*  PLT 300 263 282   Thyroid No results for input(s): "TSH", "FREET4" in the last 168 hours.  BNP Recent Labs  Lab 05/25/23 1219  BNP 336.3*    DDimer No results for input(s): "DDIMER" in the last 168 hours.   Radiology/Studies:  ECHOCARDIOGRAM COMPLETE Result Date: 05/26/2023    ECHOCARDIOGRAM REPORT   Patient Name:   [name] Date of Exam: 05/26/2023 Medical Rec #:  [id number]          Height:       64.0 in Accession #:    [id number]         Weight:       158.5 lb Date of Birth:  1945/11/16          BSA:          1.772 m Patient Age:    77 years           BP:           157/68 mmHg Patient Gender: F                  HR:  85 bpm. Exam Location:  Inpatient Procedure: 2D Echo, Color Doppler and Cardiac Doppler (Both Spectral and Color            Flow Doppler were utilized during procedure). Indications:    CHF-Acute Systolic  History:        Patient has prior history of Echocardiogram examinations, most                 recent 10/24/2021. CHF, CAD, Signs/Symptoms:Shortness of Breath;                 Risk Factors:Hypertension and Diabetes.  Sonographer:    Rosaland Lao Sonographer#2:  Delcie Roch RDCS Referring Phys: [phone] EKTA V PATEL IMPRESSIONS  1. Left ventricular ejection fraction, by estimation, is 60 to 65%. The left ventricle has normal function. The left ventricle has no regional wall motion abnormalities. There is moderate left ventricular hypertrophy. Left ventricular diastolic parameters are consistent with Grade I diastolic dysfunction (impaired relaxation).  2. Right ventricular systolic function is normal. The right ventricular size is normal. There is normal  pulmonary artery systolic pressure.  3. The mitral valve is abnormal. Mild mitral valve regurgitation. No evidence of mitral stenosis.  4. The aortic valve is tricuspid. Aortic valve regurgitation is not visualized. No aortic stenosis is present.  5. The inferior vena cava is normal in size with greater than 50% respiratory variability, suggesting right atrial pressure of 3 mmHg. FINDINGS  Left Ventricle: Left ventricular ejection fraction, by estimation, is 60 to 65%. The left ventricle has normal function. The left ventricle has no regional wall motion abnormalities. The left ventricular internal cavity size was normal in size. There is  moderate left ventricular hypertrophy. Left ventricular diastolic parameters are consistent with Grade I diastolic dysfunction (impaired relaxation). Right Ventricle: The right ventricular size is normal. Right vetricular wall thickness was not well visualized. Right ventricular systolic function is normal. There is normal pulmonary artery systolic pressure. The tricuspid regurgitant velocity is 2.78 m/s, and with an assumed right atrial pressure of 3 mmHg, the estimated right ventricular systolic pressure is 33.9 mmHg. Left Atrium: Left atrial size was normal in size. Right Atrium: Right atrial size was normal in size. Pericardium: Trivial pericardial effusion is present. The pericardial effusion is circumferential. Mitral Valve: The mitral valve is abnormal. Mild mitral valve regurgitation. No evidence of mitral valve stenosis. Tricuspid Valve: The tricuspid valve is normal in structure. Tricuspid valve regurgitation is mild . No evidence of tricuspid stenosis. Aortic Valve: The aortic valve is tricuspid. Aortic valve regurgitation is not visualized. No aortic stenosis is present. Aortic valve mean gradient measures 3.1 mmHg. Aortic valve peak gradient measures 7.1 mmHg. Aortic valve area, by VTI measures 2.02 cm. Pulmonic Valve: The pulmonic valve was not well visualized.  Pulmonic valve regurgitation is not visualized. No evidence of pulmonic stenosis. Aorta: The aortic root and ascending aorta are structurally normal, with no evidence of dilitation. Venous: The inferior vena cava is normal in size with greater than 50% respiratory variability, suggesting right atrial pressure of 3 mmHg. IAS/Shunts: No atrial level shunt detected by color flow Doppler.  LEFT VENTRICLE PLAX 2D LVIDd:         4.40 cm LVIDs:         2.70 cm LV PW:         1.30 cm LV IVS:        1.20 cm LVOT diam:     2.10 cm LV SV:  58 LV SV Index:   33 LVOT Area:     3.46 cm  RIGHT VENTRICLE             IVC RV Basal diam:  3.90 cm     IVC diam: 1.40 cm RV S prime:     15.60 cm/s TAPSE (M-mode): 2.2 cm LEFT ATRIUM           Index        RIGHT ATRIUM           Index LA diam:      3.90 cm 2.20 cm/m   RA Area:     12.70 cm LA Vol (A2C): 42.3 ml 23.87 ml/m  RA Volume:   25.10 ml  14.16 ml/m LA Vol (A4C): 58.3 ml 32.90 ml/m  AORTIC VALVE AV Area (Vmax):    2.36 cm AV Area (Vmean):   2.64 cm AV Area (VTI):     2.02 cm AV Vmax:           133.45 cm/s AV Vmean:          81.657 cm/s AV VTI:            0.288 m AV Peak Grad:      7.1 mmHg AV Mean Grad:      3.1 mmHg LVOT Vmax:         91.10 cm/s LVOT Vmean:        62.300 cm/s LVOT VTI:          0.168 m LVOT/AV VTI ratio: 0.58  AORTA Ao Root diam: 2.60 cm Ao Asc diam:  3.10 cm MITRAL VALVE               TRICUSPID VALVE MV Area (PHT): 3.53 cm    TR Peak grad:   30.9 mmHg MV Decel Time: 215 msec    TR Vmax:        278.00 cm/s MV E velocity: 70.20 cm/s MV A velocity: 97.50 cm/s  SHUNTS MV E/A ratio:  0.72        Systemic VTI:  0.17 m                            Systemic Diam: 2.10 cm Dina Rich MD Electronically signed by Dina Rich MD Signature Date/Time: 05/26/2023/2:46:41 PM    Final    DG Chest 2 View Result Date: 05/25/2023 CLINICAL DATA:  Chest pain EXAM: CHEST - 2 VIEW COMPARISON:  X-ray 05/05/2023 and older FINDINGS: Stable cardiopericardial  silhouette. No consolidation, pneumothorax or effusion. No edema. Curvature and degenerative changes of the spine. Surgical changes in the right axillary region and right upper abdomen, lower chest. Coronary stent. IMPRESSION: Chronic changes.  No acute cardiopulmonary disease. Electronically Signed   By: Karen Kays M.D.   On: 05/25/2023 13:01     Assessment and Plan:   Acute on Chronic HFpEF  SOB  - Multiple hospitalization for volume overload: 07/20/20, 09/23/20, 10/19/2020 - ECHO 05/26/23: LVEF 60 to 65%.  No RWMA. moderate LVH.  G1 DD.  Mild MVR. - Treated with IV lasix. Net I/O: -3,218.  - D/C from lasix due to renal function lab.  - Patient is laying in bed with daughter bedside, Porfirio Mylar, who called other daughter on phone, [name]. According to daughters, she had apt w/ PCP on 05/23/23 and she was fine without edema or SOB that there noticed. Per Daughters, patient is cognitively impaired and limited on information she  can provide. They noticed 3+ pitting edema and SOB start on 05/24/23. SOB is assocated with minimial exertion and not relieved by rest. Daughter showed me a picture of edema and I agree with 3+ pitting, which has significantly improved compared to today's exam. Denies orthopnea, PND, chest pain, dizziness. She has been compliant with medication.  - Admits to poor diet and denies smoking, ETOH, or drug use.  - Daughters have spoken with Nephrology  and agree to proceed with Dialysis. However, they wanted to follow up with cardiology to see if anything cardiac was contributing to her recent s/s and changes renal function labs.  - Family is aware that ECHO is unchanged from previous study. From cardiac standpoint, there are no cardiac contributing factors to patients current status. We agree with nephrology to start dialysis. With patient starting dialysis and poor renal function, no new or alternative cardiac medications are currently indicated at this time. Also,chronic Anemia is a  contributing factor to SOB.   AKI on CKD S4 - CR: 4.36 >3.95 > 4.24 > 4.65  - Nephrology was consulted. They treated with IV diuretics and metolazone with increased urine output on the first day but marginal urine output after that.  Creatinine and BUN remained elevated.  After continuing to review labs and discussion with daughter, nephrology is planning to initiate dialysis and discontinued Lasix - [facility] by [facility]   HTN  - Allow BP to be on higher side for diuresis - Amlodipine on hold  CAD  - Myoview 2019: Per Dr. Eden Emms, poor quality study but low risk study, Normal EF  - Intolerable to statins due to related myalgias - Can consider Lipid clinic referral  Hx of LE DVT Renal Arterty Thrombosis  - Holding  Eliquis for EGD/colonoscopy - Currently on Heparin gtt   DM - A1C 05/08/2023: 6.2  Acute on Chronic Anemia  -  Hgb of 7.9 & HCT 25.8 -  Hemoccult negative  -  GI consulted and planning for EGD/colonoscopy  - Anemia followed by oncology/GI multifactorial due to GI bleed, CRF   Risk Assessment/Risk Scores:     New York Heart Association (NYHA) Functional Class NYHA Class IV   For questions or updates, please contact [facility] Please consult [email or web address] for contact info under   Signed, Basilio Cairo, PA-C  05/28/2023 2:52 PM   Patient seen and examined, note reviewed with the signed Advanced Practice Provider. I personally reviewed laboratory data, imaging studies and relevant notes. I independently examined the patient and formulated the important aspects of the plan. I have personally discussed the plan with the patient and/or family. Comments or changes to the note/plan are indicated below.  She was seen and examined at her bedside. Her daughter and sister were both are bedside. She reported complaints at this time to  Coronary artery disease Hypertension Diabetes mellitus, CKD stage IV pending likely  dialysis Renal artery stent boluses now on heparin drip Chronic heart failure with preserved ejection fraction   I was able to review the patient echocardiogram with the patient and her daughter by the bedside.  Echo EF normal 66 5% with moderate LVH.  She does not appear to be fluid overloaded.  Recent changes with worsening kidney function discussion with nephrology she has agreed to start dialysis soon. In terms of her blood pressure we will continue to monitor and adjust antihypertensives as appropriate. CAD no angina symptoms She is on heparin drip but this is due to  renal artery thrombosis. She has agreed to proceed with EGD/colonoscopy in the setting of her anemia.  The patient does not have any unstable cardiac conditions.  Upon evaluation today, she can achieve 4 METs or greater without anginal symptoms.  According to [facility] and AHA guidelines, she requires no further cardiac workup prior to her noncardiac surgery and should be at acceptable risk.  Our service is available as necessary in the perioperative period.   Thomasene Ripple DO, MS [facility] Attending Cardiologist [facility]  [address] [place], [place] [address] [phone] Website: [email or web address]

## 2023-05-29 DIAGNOSIS — R0602 Shortness of breath: Secondary | ICD-10-CM | POA: Diagnosis not present

## 2023-05-29 LAB — HEPARIN LEVEL (UNFRACTIONATED)
Heparin Unfractionated: 1.06 [IU]/mL — ABNORMAL HIGH (ref 0.30–0.70)
Heparin Unfractionated: >1.1 [IU]/mL — ABNORMAL HIGH (ref 0.30–0.70)

## 2023-05-29 LAB — CBC
HCT: 23.9 % — ABNORMAL LOW (ref 36.0–46.0)
Hemoglobin: 7.6 g/dL — ABNORMAL LOW (ref 12.0–15.0)
MCH: 27.4 pg (ref 26.0–34.0)
MCHC: 31.8 g/dL (ref 30.0–36.0)
MCV: 86.3 fL (ref 80.0–100.0)
Platelets: 267 10*3/uL (ref 150–400)
RBC: 2.77 MIL/uL — ABNORMAL LOW (ref 3.87–5.11)
RDW: 18.5 % — ABNORMAL HIGH (ref 11.5–15.5)
WBC: 8.6 10*3/uL (ref 4.0–10.5)
nRBC: 0 % (ref 0.0–0.2)

## 2023-05-29 LAB — RENAL FUNCTION PANEL
Albumin: 2.3 g/dL — ABNORMAL LOW (ref 3.5–5.0)
Anion gap: 11 (ref 5–15)
BUN: 105 mg/dL — ABNORMAL HIGH (ref 8–23)
CO2: 22 mmol/L (ref 22–32)
Calcium: 8.6 mg/dL — ABNORMAL LOW (ref 8.9–10.3)
Chloride: 107 mmol/L (ref 98–111)
Creatinine, Ser: 4.45 mg/dL — ABNORMAL HIGH (ref 0.44–1.00)
GFR, Estimated: 10 mL/min — ABNORMAL LOW (ref >60)
Glucose, Bld: 92 mg/dL (ref 70–99)
Phosphorus: 5.5 mg/dL — ABNORMAL HIGH (ref 2.5–4.6)
Potassium: 4 mmol/L (ref 3.5–5.1)
Sodium: 140 mmol/L (ref 135–145)

## 2023-05-29 LAB — APTT
aPTT: 40 s — ABNORMAL HIGH (ref 24–36)
aPTT: 53 s — ABNORMAL HIGH (ref 24–36)

## 2023-05-29 MED ORDER — ACETAMINOPHEN 325 MG PO TABS
650.0000 mg | ORAL_TABLET | Freq: Four times a day (QID) | ORAL | Status: DC | PRN
  Administered 2023-05-29 – 2023-05-31 (×3): 650 mg via ORAL
  Filled 2023-05-29 (×3): qty 2

## 2023-05-29 MED ORDER — HEPARIN (PORCINE) 25000 UT/250ML-% IV SOLN: 1150.0000 [IU]/h | INTRAVENOUS | Status: DC

## 2023-05-29 MED ORDER — HEPARIN (PORCINE) 25000 UT/250ML-% IV SOLN
1150.0000 [IU]/h | INTRAVENOUS | Status: AC
  Administered 2023-05-29: 1150 [IU]/h via INTRAVENOUS
  Filled 2023-05-29: qty 250

## 2023-05-29 MED ORDER — PEG 3350-KCL-NA BICARB-NACL 420 G PO SOLR
4000.0000 mL | Freq: Once | ORAL | Status: AC
  Administered 2023-05-29: 4000 mL via ORAL
  Filled 2023-05-29: qty 4000

## 2023-05-29 MED ORDER — DARBEPOETIN ALFA 60 MCG/0.3ML IJ SOSY
60.0000 ug | PREFILLED_SYRINGE | Freq: Once | INTRAMUSCULAR | Status: AC
  Administered 2023-05-29: 60 ug via SUBCUTANEOUS
  Filled 2023-05-29: qty 0.3

## 2023-05-29 MED ORDER — SEVELAMER CARBONATE 800 MG PO TABS
800.0000 mg | ORAL_TABLET | Freq: Three times a day (TID) | ORAL | Status: DC
  Administered 2023-05-29 – 2023-06-01 (×6): 800 mg via ORAL
  Filled 2023-05-29 (×6): qty 1

## 2023-05-29 NOTE — Progress Notes (Signed)
 PHARMACY - ANTICOAGULATION CONSULT NOTE  Pharmacy Consult for Heparin Indication: h/o DVT/renal artery thrombosis (Eliquis pta)  Allergies  Allergen Reactions   Anesthetics, Halogenated Other (See Comments)    Per patient's daughter, she can not tolerate anesthetics.    Etomidate Other (See Comments)    Heart stopped during anesthesia   Fentanyl Other (See Comments)    Heart stopped during anesthesia    Percocet [Oxycodone-Acetaminophen] Other (See Comments)    Headaches    Diazepam Other (See Comments)    Agitation   Lorazepam Nausea Only   Tramadol Hcl Nausea And Vomiting   Haloperidol Nausea And Vomiting    **Haldol**   Morphine Sulfate Nausea Only    agitation   Propoxyphene Nausea And Vomiting    **Darvocet** no longer available   Versed [Midazolam] Other (See Comments)    Heart stopped during anesthesia    Patient Measurements: Height: 5\' 4"  (162.6 cm) Weight: 68.1 kg (150 lb 3.2 oz) IBW/kg (Calculated) : 54.7 Heparin Dosing Weight: 68 kg  Vital Signs: Temp: 98.1 F (36.7 C) (03/11 1956) Temp Source: Oral (03/11 1956) BP: 135/59 (03/11 2148) Pulse Rate: 83 (03/11 1956)  Labs: Recent Labs    05/26/23 1205 05/27/23 0516 05/28/23 0459 05/29/23 0011  HGB 8.4* 7.6* 7.9*  --   HCT 27.2* 24.6* 24.9*  --   PLT 300 263 282  --   APTT  --   --   --  40*  HEPARINUNFRC  --   --   --  1.06*  CREATININE 3.95* 4.24* 4.65*  --     Estimated Creatinine Clearance: 9.9 mL/min (A) (by C-G formula based on SCr of 4.65 mg/dL (H)).   Medical History: Past Medical History:  Diagnosis Date   Abdominal discomfort    Chronic N/V/D. Presumptive dx Crohn's dx per elevated p ANCA. Failed Entocort and Pentasa. Sep 2003 - ileocolectomy c anastomosis per Dr Orson Slick 2/2 adhesions - path was hegative for Crohns. EGD, Sm bowel follow through (11/03), and an eteroclysis (10/03) were unrevealing. Cuases hypomag and hypocalcemia.   Adnexal mass 10/2001   s/p lap BSO (R ovarian  fibroma) & lysis of adhesions   Allergy    Seasonal   Anemia    Multifactorial. Baseline HgB 10-11 ish. B12 def - 150 in 3/10. Fe Def - ferritin 35 3/10. Both are being repleted.   Breast cancer (HCC) 03/16/2013   right, 5 o'clock   CAD (coronary artery disease) 1996   1996 - PTCA and angioplasty diagonal branch. 2000 - Rotoblator & angiopllasty of diagonal. 2006 - subendocardial AMI, DES to proximal LAD.Marland Kitchen Also had 90% stenosis in distal apical LAD. EF 55 with apical hypokinesis. Indefinite ASA and Plavix.   CHF (congestive heart failure) (HCC)    Chronic kidney disease, stage 4 (severe) (HCC)    Chronic pain    CT 10/10 = Spinal stenosis L2 - S1.   COVID-19 11/02/2020   Diabetes mellitus    Insulin dependent   Diabetes mellitus type 2 in obese 05/03/2010   Managed on lantus and novolog. Has diabetic nephropathy. Metformin D/C'd 2012 2/2 creatinine.  No diabetic retinopathy per 3/11.    Gout    Hx of radiation therapy 06/02/13- 07/16/13   right rbeast 4500 cGy 25 sessions, right breast boost 1600 cGy in 8 sessions   Hyperlipidemia    Managed with both a statin and Welchol. Welchol stopped 2014 2/2 cost and started on fenofibrate    Hypertension    2006 B  renal arteries patent. 2003 MRA - no RAS. 2003 pheo W/U Dr Caryn Section reportedly negative.   Hypoxia 07/23/2017   Lupus    Lymphedema of breast    Personal history of radiation therapy 2015   Pneumonia of left lung due to infectious organism    RBBB    Renal vein thrombosis (HCC)    SBO (small bowel obstruction) (HCC) 09/17/2017   Secondary hyperparathyroidism (HCC)    Stroke [facility])    Incidental finding MRI 2002 L lacunar infarct   Vitamin B12 deficiency    Vitamin D deficiency    Wears dentures    top    Medications:  Medications Prior to Admission  Medication Sig Dispense Refill Last Dose/Taking   acetaminophen (TYLENOL) 650 MG CR tablet Take 1,300 mg by mouth every 8 (eight) hours as needed for pain.   05/24/2023 Evening    amLODipine (NORVASC) 10 MG tablet Take 1 tablet (10 mg total) by mouth daily. 90 tablet 3 05/25/2023   apixaban (ELIQUIS) 2.5 MG TABS tablet Take 1 tablet (2.5 mg total) by mouth 2 (two) times daily. 60 tablet 0 05/25/2023 at  8:30 AM   cephALEXin (KEFLEX) 500 MG capsule Take 1 capsule (500 mg total) by mouth daily for 14 days. 14 capsule 0 05/25/2023 Morning   colestipol (COLESTID) 1 g tablet Take 1 tablet (1 g total) by mouth 3 (three) times daily. 90 tablet 2 05/25/2023 Morning   diclofenac Sodium (VOLTAREN) 1 % GEL Apply 1 Application topically 4 (four) times daily as needed (knee pain).   05/24/2023   diphenoxylate-atropine (LOMOTIL) 2.5-0.025 MG tablet Take 1 tablet by mouth 2 (two) times daily as needed for diarrhea or loose stools. 30 tablet 1 Past Week   [EXPIRED] doxycycline (VIBRAMYCIN) 100 MG capsule Take 1 capsule (100 mg total) by mouth 2 (two) times daily for 10 days. 20 capsule 0 05/25/2023 Morning   famotidine (PEPCID) 20 MG tablet Take 20 mg by mouth daily as needed for heartburn or indigestion.   Taking As Needed   FLUoxetine (PROZAC) 20 MG capsule Take 20 mg by mouth daily.   05/25/2023 Morning   folic acid (FOLVITE) 1 MG tablet Take 1 tablet (1 mg total) by mouth daily. 30 tablet 2 05/25/2023 Morning   furosemide (LASIX) 40 MG tablet Take 1 tablet (40 mg total) by mouth daily. 90 tablet 2 05/25/2023 Morning   gabapentin (NEURONTIN) 300 MG capsule Take 300 mg by mouth at bedtime.   05/24/2023 Bedtime   HYDROcodone-acetaminophen (NORCO/VICODIN) 5-325 MG tablet Take 2 tablets by mouth every 6 (six) hours as needed. 10 tablet 0 Past Month   lipase/protease/amylase (CREON) 36000 UNITS CPEP capsule Take 1 capsule (36,000 Units total) by mouth 3 (three) times daily before meals. 180 capsule 2 05/25/2023 Morning   loperamide (IMODIUM) 2 MG capsule Take 1 capsule (2 mg total) by mouth as needed for diarrhea or loose stools. 30 capsule 0 Past Week   mirtazapine (REMERON) 7.5 MG tablet Take 7.5 mg by mouth at  bedtime.   05/24/2023 Bedtime   nitroGLYCERIN (NITROSTAT) 0.4 MG SL tablet DISSOLVE 1 TABLET UNDER THE TONGUE EVERY 5 MINUTES AS  NEEDED FOR CHEST PAIN. MAX  OF 3 TABLETS IN 15 MINUTES. CALL 911 IF PAIN PERSISTS. 25 tablet 0 Taking   pantoprazole (PROTONIX) 40 MG tablet Take 1 tablet (40 mg total) by mouth 2 (two) times daily. (Patient taking differently: Take 40 mg by mouth daily.) 30 tablet 0 05/25/2023 Morning   rivastigmine (EXELON) 1.5  MG capsule Take 1 capsule (1.5 mg total) by mouth 2 (two) times daily. 60 capsule 6 05/25/2023 Morning   tiZANidine (ZANAFLEX) 2 MG tablet Take 2 mg by mouth at bedtime as needed.   Past Month    Assessment: 78 y.o. F presents with maroon colored stool. Pt on Eliquis pta for h/o lower extremity DVT and renal artery stenosis which was continued here. Last dose taken 3/10 0830. GI following and Eliquis to be on hold. To start heparin bridge and plan for EGD/colonoscopy on Thursday. Eliquis will be affecting heparin level so will utilize aPTT for monitoring until levels correlate. Hgb low but relatively stable this admission.  3/12 AM update:  aPTT sub-therapeutic   Goal of Therapy:  Heparin level 0.3-0.7 units/ml aPTT 66-102 seconds Monitor platelets by anticoagulation protocol: Yes   Plan:  Inc heparin to 1150 units/hr Heparin level and aPTT in 8 hours  Abran Duke, PharmD, BCPS Clinical Pharmacist Phone: [phone]

## 2023-05-29 NOTE — Care Management Important Message (Signed)
 Important Message  Patient Details  Name: [name] MRN: [id number] Date of Birth: 11-Oct-1945   Important Message Given:  Yes - Medicare IM     Renie Ora 05/29/2023, 10:46 AM

## 2023-05-29 NOTE — Progress Notes (Signed)
 PROGRESS NOTE [name]  ZOX:[id number] DOB: [birth year] DOA: 05/25/2023 PCP: Renford Dills, MD  Brief Narrative/Hospital Course: 78 year old female with history of HTN, CAD, CKD stage IV lower extremity DVT/renal artery thrombosis on Eliquis, chronic HFpEF, depression/memory loss, chronic diarrhea following up with GI presented to the ED with 11 pound weight gain in [redacted] week along with lower leg edema and shortness of breath despite taking extra Lasix dose. In the ED-hemodynamically stable, on room air afebrile- felt to have volume overload BNP 336 troponin 19, hemoglobin 7.9- chest x-ray without acute cardiopulmonary finding, patient was given Lasix, nephrology consulted and admitted for further management.  Consultation: Nephrology Gastroenterology Cardiology  Subjective: Patient seen and examined this morning  Her daughter [name] at bedside Patient feeling hungry appears forgetful at baseline Weight further down 156>150>148  Assessment and Plan: Principal Problem:   SOB (shortness of breath) Active Problems:   Acute kidney injury superimposed on chronic kidney disease (HCC)   Acute on chronic heart failure with preserved ejection fraction (HFpEF) (HCC)   Benign essential HTN   CAD (coronary artery disease)   Renal vein thrombosis (HCC)   Type 2 diabetes mellitus (HCC)   Anemia   AKI on CKD stage V vs progressive CKD Metabolic acidosis Fluid overload: B/l creat 3.4-4.1 from jan-feb 2025 per nephrology with EGFR 11-13, patient with volume overload clinically, nephrology following managing with high-dose IV Lasix- although chest x-ray clear and doing well on room air.  Nephrology consulted managing with IV Lasix -holding diuretics for now monitor renal function.  Nephrology discussed above potentially starting dialysis-no obvious signs of uremia-daughter hesitant on dialysis and at this time monitoring closely by nephrology as below. Recent Labs    05/11/23 0412 05/12/23 0557  05/13/23 0355 05/14/23 0802 05/17/23 1149 05/25/23 1219 05/26/23 1205 05/27/23 0516 05/28/23 0459 05/29/23 0735  BUN 96* 81* 79* 75* 66* 88* 82* 89* 100* 105*  CREATININE 3.59* 3.48* 3.75* 3.95* 3.94* 4.36* 3.95* 4.24* 4.65* 4.45*  CO2 23 27 26 25 22  15* 17* 19* 18* 22  K 3.7 4.0 4.0 4.4 4.2 3.9 4.1 4.0 3.8 4.0    Acute on chronic CHF with preserved EF-EF on Lasix 40 daily Demand ischemia: BNP 336 on admission with troponin 19> 23-indicating demand ischemia from CHF. Received high-dose Lasix and currently on hold Last echo 10/24/2021 with EF 60-65%, moderately elevated pulmonary artery systolic pressure. Repeat echo -EF 60-65%, G1 DD normal RV systolic function.  Cardiology on board  Acute on chronic anemia with maroon-colored blood per stool Chronic diarrhea with recent worsening of symptoms Anemia from chronic kidney disease and folate deficiency anemia r/o GI bleeding: Hb baseline around 8 to 10 g in last few months. Last anemia panel from 05/09/2023 with normal B12 and folate deficiency-folate added., on aranesp. Hemoccult test negative but due to ongoing anemia GI consulted they are planning for EGD/colonoscopy and holding Eliquis 3/10. Cont home  colestipol and Imodium checking fecal Cal protectant  Recent Labs    01/03/23 1308 01/17/23 1238 01/31/23 1437 02/13/23 1332 02/28/23 1411 03/14/23 1450 04/04/23 1445 04/04/23 1446 04/18/23 1433 04/18/23 1434 05/08/23 1833 05/13/23 0358 05/13/23 0535 05/14/23 0802 05/25/23 1219 05/26/23 1205 05/27/23 0516 05/28/23 0459 05/29/23 0735  HGB 11.0*   < > 10.2*   < > 9.9*   < > 9.9*  --   --  9.4*   < >  --   --    < > 7.9* 8.4* 7.6* 7.9* 7.6*  MCV 88.7   < >  87.7   < > 85.2   < > 85.9  --   --  86.4   < >  --   --    < > 90.5 88.9 88.2 87.1 86.3  VITAMINB12 1,004*  --  1,098*  --  833  --  700  --   --  835  --  1,040*  --   --   --   --   --   --   --   FOLATE  --   --   --   --   --   --   --   --   --   --   --   --  5.3*   --   --   --   --   --   --   FERRITIN 61  --   --   --   --   --   --  153 226  --   --  220  --   --   --   --   --  246  --   TIBC  --   --   --   --   --   --   --   --   --   --   --  206*  --   --   --   --   --  207*  --   IRON  --   --   --   --   --   --   --   --   --   --   --  46  --   --   --   --   --  73  --   RETICCTPCT  --   --   --   --   --   --   --   --   --   --   --  1.5  --   --   --   --   --   --   --    < > = values in this interval not displayed.    History of renal thrombosis History of DVT: Holding Eliquis for procedure and transitioned to heparin drip pharmacy monitoring.  CAD s/p remote PCI to LAD in 2000 Hypertension HLD: No chest pain.  BP well-controlled.Allow  BP to be on higher side for diuresis/ckd.PTA on amlodipine-on hold for now Intolerant to statin, no aspirin due to need for anticoagulation. Cardiology following.  Diabetes mellitus: Last A1c 6.2 05/08/2023. PTA not on meds.  Monitor intermittently No results for input(s): "GLUCAP", "HGBA1C" in the last 168 hours.   Hypomagnesemia: Resolved  Depression Memory loss: Patient followed by neuropsychiatry team has appointment coming at [facility].  Continue her rivastigmine, folic acid, Prozac thiamine and remeron.  Moderate malnutrition: Augment diet as tolerated. Nutrition Problem: Moderate Malnutrition Etiology: acute illness Signs/Symptoms: mild fat depletion, mild muscle depletion, energy intake < 75% for > 7 days Interventions: Refer to RD note for recommendations   DVT prophylaxis: eluquis on hold Code Status:   Code Status: Full Code Family Communication: plan of care discussed with patient/daughter at bedside. Patient status is: Remains hospitalized because of severity of illness Level of care: Telemetry Cardiac   Dispo: The patient is from: Home            Anticipated disposition:TBD  Objective: Vitals last 24 hrs: Vitals:   05/28/23 2110 05/28/23 2148 05/29/23 3235  05/29/23 0540  BP: (!) 155/64 (!) 135/59 (!) 172/74 (!) 121/58  Pulse:   92   Resp:   18   Temp:   99.1 F (37.3 C)   TempSrc:   Oral   SpO2:   99%   Weight:   67.3 kg   Height:       Weight change: -0.862 kg  Physical Examination: General exam: alert awake, oriented to self place-but forgetful HEENT:Oral mucosa moist, Ear/Nose WNL grossly Respiratory system: Bilaterally clear BS,no use of accessory muscle Cardiovascular system: S1 & S2 +, No JVD. Gastrointestinal system: Abdomen soft,NT,ND, BS+ Nervous System: Alert, awake, moving all extremities,and following commands. Extremities: LE edema +,distal peripheral pulses palpable and warm.  Skin: No rashes,no icterus. MSK: Normal muscle bulk,tone, power   Medications reviewed:  Scheduled Meds:  colestipol  1 g Oral TID   darbepoetin (ARANESP) injection - NON-DIALYSIS  60 mcg Subcutaneous Once   feeding supplement  237 mL Oral BID BM   FLUoxetine  20 mg Oral Daily   folic acid  1 mg Oral Daily   lipase/protease/amylase  36,000 Units Oral TID AC   mirtazapine  7.5 mg Oral QHS   multivitamin  1 tablet Oral QHS   pantoprazole  40 mg Oral Daily   rivastigmine  1.5 mg Oral BID   sodium bicarbonate  650 mg Oral BID   sodium chloride flush  3 mL Intravenous Q12H   thiamine  100 mg Oral Daily  Continuous Infusions:  heparin 1,150 Units/hr (05/29/23 0242)     Diet Order             Diet clear liquid Room service appropriate? Yes; Fluid consistency: Thin  Diet effective now                  Intake/Output Summary (Last 24 hours) at 05/29/2023 1142 Last data filed at 05/29/2023 0730 Gross per 24 hour  Intake --  Output 1050 ml  Net -1050 ml   Net IO Since Admission: -3,768.8 mL [05/29/23 1142]  Wt Readings from Last 3 Encounters:  05/29/23 67.3 kg  05/17/23 71.1 kg  05/14/23 71.1 kg     Unresulted Labs (From admission, onward)     Start     Ordered   05/30/23 0500  APTT  Daily,   R     Question:  Specimen  collection method  Answer:  Lab=Lab collect   05/28/23 1504   05/30/23 0500  Heparin level (unfractionated)  Daily,   R     Question:  Specimen collection method  Answer:  Lab=Lab collect   05/28/23 1504   05/30/23 0500  Renal function panel  Daily,   R     Question:  Specimen collection method  Answer:  Lab=Lab collect   05/29/23 1024   05/27/23 1137  Calprotectin, Fecal  Once,   R        05/27/23 1136   05/27/23 0500  CBC  Daily,   R      05/26/23 0820          Data Reviewed: I have personally reviewed following labs and imaging studies CBC: Recent Labs  Lab 05/25/23 1219 05/26/23 1205 05/27/23 0516 05/28/23 0459 05/29/23 0735  WBC 9.9 8.7 8.6 9.2 8.6  HGB 7.9* 8.4* 7.6* 7.9* 7.6*  HCT 25.8* 27.2* 24.6* 24.9* 23.9*  MCV 90.5 88.9 88.2 87.1 86.3  PLT 258 300 263 282 267   Basic Metabolic Panel:  Recent Labs  Lab 05/25/23  1219 05/25/23 2240 05/26/23 0616 05/26/23 1205 05/27/23 0516 05/28/23 0459 05/29/23 0735  NA 140  --   --  140 140 137 140  K 3.9  --   --  4.1 4.0 3.8 4.0  CL 112*  --   --  112* 111 112* 107  CO2 15*  --   --  17* 19* 18* 22  GLUCOSE 94  --   --  97 93 87 92  BUN 88*  --   --  82* 89* 100* 105*  CREATININE 4.36*  --   --  3.95* 4.24* 4.65* 4.45*  CALCIUM 8.0*  --   --  8.5* 8.5* 8.1* 8.6*  MG  --  0.9* 1.9  --   --   --   --   PHOS  --   --   --  4.5 5.6* 6.3* 5.5*  GFR: Estimated Creatinine Clearance: 10.3 mL/min (A) (by C-G formula based on SCr of 4.45 mg/dL (H)).  Liver Function Tests:  Recent Labs  Lab 05/25/23 1535 05/26/23 1205 05/27/23 0516 05/28/23 0459 05/29/23 0735  AST 14*  --   --   --   --   ALT 11  --   --   --   --   ALKPHOS 91  --   --   --   --   BILITOT 0.4  --   --   --   --   PROT 6.5  --   --   --   --   ALBUMIN 2.5* 2.4* 2.1* 2.4* 2.3*   Antimicrobials/Microbiology: Anti-infectives (From admission, onward)    None         Component Value Date/Time   SDES BLOOD BLOOD LEFT ARM 07/29/2022 0236    SPECREQUEST  07/29/2022 0236    BOTTLES DRAWN AEROBIC AND ANAEROBIC Blood Culture adequate volume   CULT  07/29/2022 0236    NO GROWTH 5 DAYS Performed at [facility], [address], [place], [place] [address]    REPTSTATUS 08/03/2022 FINAL 07/29/2022 0236  Radiology Studies: US RENAL Result Date: 05/28/2023 CLINICAL DATA:  Chronic kidney disease. EXAM: RENAL / URINARY TRACT ULTRASOUND COMPLETE COMPARISON:  04/08/2023, 02/03/2022. FINDINGS: Right Kidney: Renal measurements: 10.3 x 5.5 x 5.5 cm = volume: 165.4 mL. Increased echogenicity is noted. Cysts are present measuring 1.0 x 0.9 x 0.8 cm and 1.2 x 1.0 x 1.1 cm. No mass or hydronephrosis visualized. Left Kidney: Renal measurements: 10.1 x 4.9 x 5.9 cm = volume: 151.7 mL. Increased echogenicity is noted. Cysts are present measuring 2.3 x 1.5 x 2.2 cm and 1.0 x 0.9 x 0.9 cm. No mass or hydronephrosis visualized. Bladder: Appears normal for degree of bladder distention. Bilateral ureteral jets are noted. Other: None. IMPRESSION: Increased renal parenchymal echogenicity and bilateral renal cysts, compatible with medical renal disease. Electronically Signed   By: Thornell Sartorius M.D.   On: 05/28/2023 21:01   LOS: 4 days  Total time spent in review of labs and imaging, patient evaluation, formulation of plan, documentation and communication with family: 35 minutes  Lanae Boast, MD  [facility]  05/29/2023, 11:42 AM

## 2023-05-29 NOTE — Plan of Care (Signed)
   Problem: Skin Integrity: Goal: Risk for impaired skin integrity will decrease Outcome: Progressing   Problem: Education: Goal: Knowledge of General Education information will improve Description: Including pain rating scale, medication(s)/side effects and non-pharmacologic comfort measures Outcome: Progressing   Problem: Clinical Measurements: Goal: Ability to maintain clinical measurements within normal limits will improve Outcome: Progressing

## 2023-05-29 NOTE — Progress Notes (Signed)
 [facility] Gastroenterology Progress Note  [name] 78 y.o. 1945-09-01  CC: Anemia, diarrhea   Subjective: Patient seen and examined at bedside.  Family at bedside.  Patient denies any further diarrhea.  Denies any further bleeding.  Currently on heparin drip.    ROS : Afebrile, negative for chest pain   Objective: Vital signs in last 24 hours: Vitals:   05/29/23 0437 05/29/23 0540  BP: (!) 172/74 (!) 121/58  Pulse: 92   Resp: 18   Temp: 99.1 F (37.3 C)   SpO2: 99%     Physical Exam:  General:   Alert,  Well-developed, well-nourished, pleasant and cooperative in NAD Lungs: No visible respiratory distress Heart:  Regular rate and rhythm; no murmurs, clicks, rubs,  or gallops. Abdomen: Soft, nontender, nondistended, bowel sound present, no peritoneal signs Lower extremity edema noted Mood and affect normal Alert and oriented x 3   Lab Results: Recent Labs    05/28/23 0459 05/29/23 0735  NA 137 140  K 3.8 4.0  CL 112* 107  CO2 18* 22  GLUCOSE 87 92  BUN 100* 105*  CREATININE 4.65* 4.45*  CALCIUM 8.1* 8.6*  PHOS 6.3* 5.5*   Recent Labs    05/28/23 0459 05/29/23 0735  ALBUMIN 2.4* 2.3*   Recent Labs    05/28/23 0459 05/29/23 0735  WBC 9.2 8.6  HGB 7.9* 7.6*  HCT 24.9* 23.9*  MCV 87.1 86.3  PLT 282 267   No results for input(s): "LABPROT", "INR" in the last 72 hours.    Assessment/Plan: -Acute on chronic anemia with maroon-colored blood per stool. -Chronic diarrhea with recent worsening of symptoms.  Currently on colestipol.  Not sure if she is getting Creon supplements. -History of lower extremity DVT and renal artery stenosis.  Eliquis on hold.  Currently on heparin drip. -Acute on chronic kidney disease.  Followed by nephrology.     Recommendations ------------------------- -Patient denies any further diarrhea.  No further bleeding.  Currently on heparin drip. -Plan for EGD and colonoscopy tomorrow.  Okay to have clear liquids diet  today.  N.p.o. past midnight. -Recommend to hold heparin for 4 to 6 hours prior to procedure tomorrow which is scheduled for 11 AM.  Risks (bleeding, infection, bowel perforation that could require surgery, sedation-related changes in cardiopulmonary systems), benefits (identification and possible treatment of source of symptoms, exclusion of certain causes of symptoms), and alternatives (watchful waiting, radiographic imaging studies, empiric medical treatment)  were explained to patient and family in detail and patient wishes to proceed.     Kathi Der MD, FACP 05/29/2023, 10:14 AM  Contact #  [phone]

## 2023-05-29 NOTE — Progress Notes (Signed)
 PHARMACY - ANTICOAGULATION CONSULT NOTE  Pharmacy Consult for Heparin Indication: h/o DVT/renal artery thrombosis (Eliquis pta)  Allergies  Allergen Reactions   Anesthetics, Halogenated Other (See Comments)    Per patient's daughter, she can not tolerate anesthetics.    Etomidate Other (See Comments)    Heart stopped during anesthesia   Fentanyl Other (See Comments)    Heart stopped during anesthesia    Percocet [Oxycodone-Acetaminophen] Other (See Comments)    Headaches    Diazepam Other (See Comments)    Agitation   Lorazepam Nausea Only   Tramadol Hcl Nausea And Vomiting   Haloperidol Nausea And Vomiting    **Haldol**   Morphine Sulfate Nausea Only    agitation   Propoxyphene Nausea And Vomiting    **Darvocet** no longer available   Versed [Midazolam] Other (See Comments)    Heart stopped during anesthesia    Patient Measurements: Height: 5\' 4"  (162.6 cm) Weight: 67.3 kg (148 lb 4.8 oz) IBW/kg (Calculated) : 54.7 Heparin Dosing Weight: 68 kg  Vital Signs: Temp: 99.1 F (37.3 C) (03/12 0437) Temp Source: Oral (03/12 0437) BP: 121/58 (03/12 0540) Pulse Rate: 92 (03/12 0437)  Labs: Recent Labs    05/27/23 0516 05/28/23 0459 05/29/23 0011 05/29/23 0735 05/29/23 0855  HGB 7.6* 7.9*  --  7.6*  --   HCT 24.6* 24.9*  --  23.9*  --   PLT 263 282  --  267  --   APTT  --   --  40*  --  53*  HEPARINUNFRC  --   --  1.06*  --  >1.10*  CREATININE 4.24* 4.65*  --  4.45*  --     Estimated Creatinine Clearance: 10.3 mL/min (A) (by C-G formula based on SCr of 4.45 mg/dL (H)).   Medical History: Past Medical History:  Diagnosis Date   Abdominal discomfort    Chronic N/V/D. Presumptive dx Crohn's dx per elevated p ANCA. Failed Entocort and Pentasa. Sep 2003 - ileocolectomy c anastomosis per Dr Orson Slick 2/2 adhesions - path was hegative for Crohns. EGD, Sm bowel follow through (11/03), and an eteroclysis (10/03) were unrevealing. Cuases hypomag and hypocalcemia.    Adnexal mass 10/2001   s/p lap BSO (R ovarian fibroma) & lysis of adhesions   Allergy    Seasonal   Anemia    Multifactorial. Baseline HgB 10-11 ish. B12 def - 150 in 3/10. Fe Def - ferritin 35 3/10. Both are being repleted.   Breast cancer (HCC) 03/16/2013   right, 5 o'clock   CAD (coronary artery disease) 1996   1996 - PTCA and angioplasty diagonal branch. 2000 - Rotoblator & angiopllasty of diagonal. 2006 - subendocardial AMI, DES to proximal LAD.Marland Kitchen Also had 90% stenosis in distal apical LAD. EF 55 with apical hypokinesis. Indefinite ASA and Plavix.   CHF (congestive heart failure) (HCC)    Chronic kidney disease, stage 4 (severe) (HCC)    Chronic pain    CT 10/10 = Spinal stenosis L2 - S1.   COVID-19 11/02/2020   Diabetes mellitus    Insulin dependent   Diabetes mellitus type 2 in obese 05/03/2010   Managed on lantus and novolog. Has diabetic nephropathy. Metformin D/C'd 2012 2/2 creatinine.  No diabetic retinopathy per 3/11.    Gout    Hx of radiation therapy 06/02/13- 07/16/13   right rbeast 4500 cGy 25 sessions, right breast boost 1600 cGy in 8 sessions   Hyperlipidemia    Managed with both a statin and Welchol. Welchol stopped  2014 2/2 cost and started on fenofibrate    Hypertension    2006 B renal arteries patent. 2003 MRA - no RAS. 2003 pheo W/U Dr Caryn Section reportedly negative.   Hypoxia 07/23/2017   Lupus    Lymphedema of breast    Personal history of radiation therapy 2015   Pneumonia of left lung due to infectious organism    RBBB    Renal vein thrombosis (HCC)    SBO (small bowel obstruction) (HCC) 09/17/2017   Secondary hyperparathyroidism (HCC)    Stroke [facility])    Incidental finding MRI 2002 L lacunar infarct   Vitamin B12 deficiency    Vitamin D deficiency    Wears dentures    top    Medications:  Medications Prior to Admission  Medication Sig Dispense Refill Last Dose/Taking   acetaminophen (TYLENOL) 650 MG CR tablet Take 1,300 mg by mouth every 8 (eight) hours  as needed for pain.   05/24/2023 Evening   amLODipine (NORVASC) 10 MG tablet Take 1 tablet (10 mg total) by mouth daily. 90 tablet 3 05/25/2023   apixaban (ELIQUIS) 2.5 MG TABS tablet Take 1 tablet (2.5 mg total) by mouth 2 (two) times daily. 60 tablet 0 05/25/2023 at  8:30 AM   cephALEXin (KEFLEX) 500 MG capsule Take 1 capsule (500 mg total) by mouth daily for 14 days. 14 capsule 0 05/25/2023 Morning   colestipol (COLESTID) 1 g tablet Take 1 tablet (1 g total) by mouth 3 (three) times daily. 90 tablet 2 05/25/2023 Morning   diclofenac Sodium (VOLTAREN) 1 % GEL Apply 1 Application topically 4 (four) times daily as needed (knee pain).   05/24/2023   diphenoxylate-atropine (LOMOTIL) 2.5-0.025 MG tablet Take 1 tablet by mouth 2 (two) times daily as needed for diarrhea or loose stools. 30 tablet 1 Past Week   [EXPIRED] doxycycline (VIBRAMYCIN) 100 MG capsule Take 1 capsule (100 mg total) by mouth 2 (two) times daily for 10 days. 20 capsule 0 05/25/2023 Morning   famotidine (PEPCID) 20 MG tablet Take 20 mg by mouth daily as needed for heartburn or indigestion.   Taking As Needed   FLUoxetine (PROZAC) 20 MG capsule Take 20 mg by mouth daily.   05/25/2023 Morning   folic acid (FOLVITE) 1 MG tablet Take 1 tablet (1 mg total) by mouth daily. 30 tablet 2 05/25/2023 Morning   furosemide (LASIX) 40 MG tablet Take 1 tablet (40 mg total) by mouth daily. 90 tablet 2 05/25/2023 Morning   gabapentin (NEURONTIN) 300 MG capsule Take 300 mg by mouth at bedtime.   05/24/2023 Bedtime   HYDROcodone-acetaminophen (NORCO/VICODIN) 5-325 MG tablet Take 2 tablets by mouth every 6 (six) hours as needed. 10 tablet 0 Past Month   lipase/protease/amylase (CREON) 36000 UNITS CPEP capsule Take 1 capsule (36,000 Units total) by mouth 3 (three) times daily before meals. 180 capsule 2 05/25/2023 Morning   loperamide (IMODIUM) 2 MG capsule Take 1 capsule (2 mg total) by mouth as needed for diarrhea or loose stools. 30 capsule 0 Past Week   mirtazapine (REMERON)  7.5 MG tablet Take 7.5 mg by mouth at bedtime.   05/24/2023 Bedtime   nitroGLYCERIN (NITROSTAT) 0.4 MG SL tablet DISSOLVE 1 TABLET UNDER THE TONGUE EVERY 5 MINUTES AS  NEEDED FOR CHEST PAIN. MAX  OF 3 TABLETS IN 15 MINUTES. CALL 911 IF PAIN PERSISTS. 25 tablet 0 Taking   pantoprazole (PROTONIX) 40 MG tablet Take 1 tablet (40 mg total) by mouth 2 (two) times daily. (Patient taking differently:  Take 40 mg by mouth daily.) 30 tablet 0 05/25/2023 Morning   rivastigmine (EXELON) 1.5 MG capsule Take 1 capsule (1.5 mg total) by mouth 2 (two) times daily. 60 capsule 6 05/25/2023 Morning   tiZANidine (ZANAFLEX) 2 MG tablet Take 2 mg by mouth at bedtime as needed.   Past Month    Assessment: 78 y.o. F presents with maroon colored stool. Pt on Eliquis pta for h/o lower extremity DVT and renal artery stenosis which was initially continued here. Last dose taken 3/10 0830. GI following and Eliquis to be on hold. To start heparin bridge and plan for EGD/colonoscopy on Thursday. Eliquis will be affecting heparin level so will utilize aPTT for monitoring until levels correlate. Hgb low but relatively stable this admission.  Heparin drip rate 1150 uts/hr as expected from apixaban heparin level > 1.1 but aptt 53sec < goal - with ongoing bleeding will not be overly aggressive in dose titrations prior to endoscopy Patient has history of VTE hgb low but stable 7.6 with maroon stool  Plan for heparin to stop at 0500 3/13 and endoscopy at 11am  Goal of Therapy:  Heparin level 0.3-0.7 units/ml aPTT 66-102 seconds Monitor platelets by anticoagulation protocol: Yes   Plan:  Continue heparin infusion at 1050 units/hr. No bolus. Daily heparin level, aPTT, and CBC Follow up plan after procedure 3/13     Leota Sauers Pharm.D. CPP, BCPS Clinical Pharmacist [phone] 05/29/2023 1:28 PM

## 2023-05-29 NOTE — Progress Notes (Signed)
 Mobility Specialist Progress Note;   05/29/23 0955  Mobility  Activity Ambulated with assistance in hallway  Level of Assistance Standby assist, set-up cues, supervision of patient - no hands on  Assistive Device Front wheel walker  Distance Ambulated (ft) 175 ft  Activity Response Tolerated well  Mobility Referral Yes  Mobility visit 1 Mobility  Mobility Specialist Start Time (ACUTE ONLY) 0955  Mobility Specialist Stop Time (ACUTE ONLY) 1005  Mobility Specialist Time Calculation (min) (ACUTE ONLY) 10 min   Pt agreeable to mobility. Required no physical assistance during ambulation, SV. VSS throughout. Only c/o feeling nauseas, otherwise asx. Pt returned to sitting on EOB with all needs met.   Caesar Bookman Mobility Specialist Please contact via SecureChat or [employer] [phone]

## 2023-05-29 NOTE — Progress Notes (Addendum)
 Rounding Note    Patient Name: [name] Peri Date of Encounter: 05/29/2023  [facility] Cardiologist: Charlton Haws, MD   Subjective   No significant overnight events. Patient denies any shortness of breath or chest pain today. She does have some mild edema but states this looks good for her. No cardiac complaints this morning.  Inpatient Medications    Scheduled Meds:  colestipol  1 g Oral TID   feeding supplement  237 mL Oral BID BM   FLUoxetine  20 mg Oral Daily   folic acid  1 mg Oral Daily   lipase/protease/amylase  36,000 Units Oral TID AC   mirtazapine  7.5 mg Oral QHS   multivitamin  1 tablet Oral QHS   pantoprazole  40 mg Oral Daily   rivastigmine  1.5 mg Oral BID   sodium bicarbonate  650 mg Oral BID   sodium chloride flush  3 mL Intravenous Q12H   thiamine  100 mg Oral Daily   Continuous Infusions:  heparin 1,150 Units/hr (05/29/23 0242)   PRN Meds: hydrALAZINE, loperamide, nitroGLYCERIN   Vital Signs    Vitals:   05/28/23 2110 05/28/23 2148 05/29/23 0437 05/29/23 0540  BP: (!) 155/64 (!) 135/59 (!) 172/74 (!) 121/58  Pulse:   92   Resp:   18   Temp:   99.1 F (37.3 C)   TempSrc:   Oral   SpO2:   99%   Weight:   67.3 kg   Height:        Intake/Output Summary (Last 24 hours) at 05/29/2023 0742 Last data filed at 05/29/2023 0418 Gross per 24 hour  Intake 3 ml  Output 1150 ml  Net -1147 ml      05/29/2023    4:37 AM 05/28/2023    5:09 AM 05/27/2023    6:00 AM  Last 3 Weights  Weight (lbs) 148 lb 4.8 oz 150 lb 3.2 oz 156 lb 12 oz  Weight (kg) 67.268 kg 68.13 kg 71.1 kg      Telemetry    Normal sinus rhythm with rates in the 80s. PVCs noted. - Personally Reviewed  ECG    No new ECG tracings today. - Personally Reviewed  Physical Exam   GEN: African-American female resting comfortably in no acute distress.   Neck: No JVD. Cardiac: RRR. No murmurs, rubs, or gallops.  Respiratory: No increased work of breathing. Clear to  auscultation bilaterally. No wheezes, rhonchi, or rales. MS: 2+ lower extremity edema of bilateral lower extremities (left chronically larger than right). No deformity. Skin: Warm and dry. Neuro:  No focal deficits.  Psych: Normal affect. Responds appropriately.  Labs    High Sensitivity Troponin:   Recent Labs  Lab 05/25/23 1219 05/25/23 1535  TROPONINIHS 19* 23*     Chemistry Recent Labs  Lab 05/25/23 1535 05/25/23 2240 05/26/23 0616 05/26/23 1205 05/27/23 0516 05/28/23 0459  NA  --   --   --  140 140 137  K  --   --   --  4.1 4.0 3.8  CL  --   --   --  112* 111 112*  CO2  --   --   --  17* 19* 18*  GLUCOSE  --   --   --  97 93 87  BUN  --   --   --  82* 89* 100*  CREATININE  --   --   --  3.95* 4.24* 4.65*  CALCIUM  --   --   --  8.5* 8.5* 8.1*  MG  --  0.9* 1.9  --   --   --   PROT 6.5  --   --   --   --   --   ALBUMIN 2.5*  --   --  2.4* 2.1* 2.4*  AST 14*  --   --   --   --   --   ALT 11  --   --   --   --   --   ALKPHOS 91  --   --   --   --   --   BILITOT 0.4  --   --   --   --   --   GFRNONAA  --   --   --  11* 10* 9*  ANIONGAP  --   --   --  11 10 7     Lipids No results for input(s): "CHOL", "TRIG", "HDL", "LABVLDL", "LDLCALC", "CHOLHDL" in the last 168 hours.  Hematology Recent Labs  Lab 05/26/23 1205 05/27/23 0516 05/28/23 0459  WBC 8.7 8.6 9.2  RBC 3.06* 2.79* 2.86*  HGB 8.4* 7.6* 7.9*  HCT 27.2* 24.6* 24.9*  MCV 88.9 88.2 87.1  MCH 27.5 27.2 27.6  MCHC 30.9 30.9 31.7  RDW 18.8* 18.9* 18.6*  PLT 300 263 282   Thyroid No results for input(s): "TSH", "FREET4" in the last 168 hours.  BNP Recent Labs  Lab 05/25/23 1219  BNP 336.3*    DDimer No results for input(s): "DDIMER" in the last 168 hours.   Radiology    US RENAL Result Date: 05/28/2023 CLINICAL DATA:  Chronic kidney disease. EXAM: RENAL / URINARY TRACT ULTRASOUND COMPLETE COMPARISON:  04/08/2023, 02/03/2022. FINDINGS: Right Kidney: Renal measurements: 10.3 x 5.5 x 5.5 cm =  volume: 165.4 mL. Increased echogenicity is noted. Cysts are present measuring 1.0 x 0.9 x 0.8 cm and 1.2 x 1.0 x 1.1 cm. No mass or hydronephrosis visualized. Left Kidney: Renal measurements: 10.1 x 4.9 x 5.9 cm = volume: 151.7 mL. Increased echogenicity is noted. Cysts are present measuring 2.3 x 1.5 x 2.2 cm and 1.0 x 0.9 x 0.9 cm. No mass or hydronephrosis visualized. Bladder: Appears normal for degree of bladder distention. Bilateral ureteral jets are noted. Other: None. IMPRESSION: Increased renal parenchymal echogenicity and bilateral renal cysts, compatible with medical renal disease. Electronically Signed   By: Thornell Sartorius M.D.   On: 05/28/2023 21:01    Cardiac Studies   Echocardiogram 05/26/2023: Impressions:  1. Left ventricular ejection fraction, by estimation, is 60 to 65%. The  left ventricle has normal function. The left ventricle has no regional  wall motion abnormalities. There is moderate left ventricular hypertrophy.  Left ventricular diastolic  parameters are consistent with Grade I diastolic dysfunction (impaired  relaxation).   2. Right ventricular systolic function is normal. The right ventricular  size is normal. There is normal pulmonary artery systolic pressure.   3. The mitral valve is abnormal. Mild mitral valve regurgitation. No  evidence of mitral stenosis.   4. The aortic valve is tricuspid. Aortic valve regurgitation is not  visualized. No aortic stenosis is present.   5. The inferior vena cava is normal in size with greater than 50%  respiratory variability, suggesting right atrial pressure of 3 mmHg.    Patient Profile     78 y.o. female with a history of CAD s/p remote PCI of LAD in 2000, chronic HFpEF, CVA (noted on brain MRI in 2002), prior lower extremity  DVT/ renal artery thrombosis on Eliquis, hypertension, hyperlipidemia, type 2 diabetes mellitus, CKD stage IV, chronic diarrhea, depression, and memory loss who was admitted on 05/25/2023 for acute on  chronic CHF after presenting with lower extremity edema, dyspnea on exertion, and weight gain.  Assessment & Plan    Acute on Chronic HFpEF Patient presented with dyspnea on exertion, lower extremity edema, and weight gain. BNP elevated at 336. Chest x-ray showed no acute findings. Echo showed LVEF of 60-65% with moderate LVH and grade 1 diastolic dysfunction. He was diuresed with IV Lasix and Metolazone per Nephrology. Net negative 3.6 L this admission. Weight down 13 lbs this admission. Creatinine stable at 4.45 today. - Mild lower extremity edema but otherwise does not look significantly volume overloaded.  - Will defer volume management to Nephrology given renal function. Per their note from yesterday, plan is to likely start dialysis today.  - No Spironolactone of SGLT2 inhibitor given renal function. - Continue to monitor daily weights, strict I/Os, and renal function.  CAD S/p remote PCI of LAD in 2000.  - No chest pain.  - No aspirin due to need for full anticoagulation. - Intolerant to statins in the past.  - Consider outpatient referral to [facility] lipid clinic for consideration of PCSK9 inhibitor.  Hypertension BP somewhat labile. Systolic BP has ranged from 121 to 178 within the last 24 hours. - Home Amlodipine on hold. - Will hold off on adding any antihypertensive right now given plans to start dialysis.   Hyperlipidemia LDL 40 in 2022.  - Intolerant to statins int he past.  - Can consider outpatient referral to Pharm[facility] lipid clinic for consideration of PCSK9 inhibitor.  AKI on CKD Stage IV Recent baseline creatinine around 3.4 to 4.1. Creatinine 4.36 on admission and peaked at 4.65 yesterday. Renal ultrasound showed increased renal parenchymal echogenicity and bilateral renal cysts compatible with medical renal disease. - Creatinine stable at 4.45 today. - Nephrology following. It sounds like they are likely going to start dialysis. However, patient will need tunneled HD  cath.  History of Lower Extremity DVT History of Renal Artery Thrombosis - On chronic anticoagulation with Eliquis at home. However, this has been held and he is currently on IV Heparin given plans for EGD/ colonoscopy tomorrow.  Chronic Diarrhea Acute on Chronic Anemia Patient has chronic diarrhea with worsening symptoms recently. He also has acute on chronic anemia (hemoglobin as low as 7.6) with reports of maroon colored stools. Hemoccult was positive.  - Hemoglobin stable at 7.9 yesterday. Today's labs pending. - Management per GI. Plan is for EGD/ colonoscopy tomorrow. Ok to proceed with this from a cardiac standpoint.  Otherwise, per primary team: - Type 2 diabetes mellitus - Depression - Memory loss For questions or updates, please contact [facility] Please consult [email or web address] for contact info under     Signed, Corrin Parker, PA-C  05/29/2023, 7:42 AM    Patient seen and examined, note reviewed with the signed Advanced Practice Provider. I personally reviewed laboratory data, imaging studies and relevant notes. I independently examined the patient and formulated the important aspects of the plan. I have personally discussed the plan with the patient and/or family. Comments or changes to the note/plan are indicated below.  She was seen and examined at her bedside.   Coronary artery disease Hypertension Diabetes mellitus, CKD stage IV pending likely dialysis Renal artery stent boluses now on heparin drip Chronic heart failure with preserved ejection fraction     She has  no complaints today from a cardiovascular standpoint.  Echo EF normal 66-65% with moderate LVH.  From a heart failure standpoint as well as kidney dysfunction I will defer to nephrology at this point.  With recent changes with worsening kidney function discussion with nephrology she has agreed to start dialysis soon.  In terms of her blood pressure her blood pressure is fluctuating with the  start of hemodialysis will hold off on adding her amlodipine.  And monitor for now.  Postdialysis we can reassess.  she is on heparin drip but this is due to renal artery thrombosis. She has agreed to proceed with EGD/colonoscopy in the setting of her anemia.   The patient does not have any unstable cardiac conditions.  Upon evaluation today, she can achieve 4 METs or greater without anginal symptoms.  According to [facility] and AHA guidelines, she requires no further cardiac workup prior to her noncardiac surgery and should be at acceptable risk.  Our service is available as necessary in the perioperative period.    Thomasene Ripple DO, MS [facility] Attending Cardiologist [facility]  [address] [place], [place] [address] [phone] Website: [email or web address]

## 2023-05-29 NOTE — Plan of Care (Signed)
  Problem: Fluid Volume: Goal: Ability to maintain a balanced intake and output will improve Outcome: Progressing   Problem: Metabolic: Goal: Ability to maintain appropriate glucose levels will improve Outcome: Progressing   Problem: Education: Goal: Knowledge of General Education information will improve Description: Including pain rating scale, medication(s)/side effects and non-pharmacologic comfort measures Outcome: Progressing   Problem: Clinical Measurements: Goal: Ability to maintain clinical measurements within normal limits will improve Outcome: Progressing Goal: Will remain free from infection Outcome: Progressing Goal: Diagnostic test results will improve Outcome: Progressing Goal: Cardiovascular complication will be avoided Outcome: Progressing   Problem: Nutrition: Goal: Adequate nutrition will be maintained Outcome: Progressing   Problem: Coping: Goal: Level of anxiety will decrease Outcome: Progressing   Problem: Pain Managment: Goal: General experience of comfort will improve and/or be controlled Outcome: Progressing   Problem: Safety: Goal: Ability to remain free from injury will improve Outcome: Progressing   Problem: Cardiac: Goal: Ability to achieve and maintain adequate cardiopulmonary perfusion will improve Outcome: Progressing

## 2023-05-29 NOTE — TOC Progression Note (Signed)
 Transition of Care [facility]) - Progression Note    Patient Details  Name: [name] MRN: [id number] Date of Birth: 1945-11-12  Transition of Care [facility]) CM/SW Contact  Alesia Richards, RN Phone Number:[phone] 05/29/2023, 11:10 AM  Clinical Narrative:     CM discussed case at progression of care rounds. Hospital day 4 with diagnosis of shortness of breath. Nephrology following for history of CKD 5 with acute on chronic CHF. Per Dr. Sherrilee Gilles, Nephrology, diet advanced to clear liquid diet, will continue to monitor labs and strict I&O. Discharge plan home with home health RN/PT/OT/HHA/SW and family/caregiver support. [facility] is active in home. CM will continue to for medical stability and discharge care planning needs.   Expected Discharge Plan: Home w Home Health Services (Patient is active with [facility] for PT/HHA) Barriers to Discharge: Continued Medical Work up  Expected Discharge Plan and Services   Discharge Planning Services: CM Consult Post Acute Care Choice: Home Health Living arrangements for the past 2 months: Single Family Home      HH Arranged: PT, Nurse's Aide, Social Work, OT [employer] Agency:  [employer] [facility] phone [phone] POC: Clifton Custard) Date [facility] Agency Contacted: 05/27/23 Time HH Agency Contacted: [phone] Representative spoke with at [facility] Agency: Clifton Custard   Social Determinants of Health (SDOH) Interventions SDOH Screenings   Food Insecurity: No Food Insecurity (05/25/2023)  Housing: Low Risk  (05/25/2023)  Transportation Needs: No Transportation Needs (05/25/2023)  Utilities: Not At Risk (05/25/2023)  Alcohol Screen: Low Risk  (10/25/2021)  Depression (PHQ2-9): Low Risk  (09/12/2020)  Financial Resource Strain: Low Risk  (10/25/2021)  Social Connections: Socially Isolated (05/25/2023)  Tobacco Use: Low Risk  (05/25/2023)    Readmission Risk Interventions    05/13/2023    4:50 PM 12/27/2021    2:10 PM 11/08/2021   12:56 PM  Readmission Risk  Prevention Plan  Transportation Screening Complete Complete Complete  Medication Review (RN Care Manager) Referral to Pharmacy Complete Complete  PCP or Specialist appointment within 3-5 days of discharge Complete Complete Complete  HRI or Home Care Consult Complete Complete   SW Recovery Care/Counseling Consult Complete Complete Complete  Palliative Care Screening Not Applicable  Not Applicable  Skilled Nursing Facility Not Applicable Not Applicable Not Applicable

## 2023-05-29 NOTE — Progress Notes (Signed)
 Occupational Therapy Treatment Patient Details Name: [name] MRN: [id number] DOB: 08/03/1945 Today's Date: 05/29/2023   History of present illness Patient is a 78 yo female presenting to the ED with weight gain of 11 pounds in 1 week on 05/25/23. Admitted with SoB and AKI. Patient with multiple admissions PMH:  CKD IV, HTN, CHF, CAD, DVT, CVA, DM2, gout, chronic gastritis, breast CA, dementia, depression.   OT comments  Pt doing well in OT sessions, ambulating with close supervision using RW and CGA no AD, simulating some home tasks such as baking. Some notes indicated that pt has PRN assist from family and others state that it is 24/7, given pt's hx of cognitive deficits she would need 24/7 supervision at time of DC. She is forgetful, needed cues for topographical orientation to return to room. OT to continue to progress pt as able, DC plans remain appropriate for [facility].       If plan is discharge home, recommend the following:  Supervision due to cognitive status;Direct supervision/assist for financial management;Direct supervision/assist for medications management;Assist for transportation   Equipment Recommendations  None recommended by OT    Recommendations for Other Services      Precautions / Restrictions Precautions Precautions: Fall Restrictions Weight Bearing Restrictions Per Provider Order: No       Mobility Bed Mobility Overal bed mobility: Needs Assistance Bed Mobility: Supine to Sit     Supine to sit: Supervision          Transfers Overall transfer level: Needs assistance Equipment used: Rolling walker (2 wheels) Transfers: Sit to/from Stand Sit to Stand: Contact guard assist                 Balance Overall balance assessment: Needs assistance Sitting-balance support: Feet supported Sitting balance-Leahy Scale: Good     Standing balance support: During functional activity Standing balance-Leahy Scale: Fair Standing balance comment:  ambulatory in room no AD                           ADL either performed or assessed with clinical judgement   ADL                                       Functional mobility during ADLs: Supervision/safety;Rolling walker (2 wheels) (close supervision) General ADL Comments: Pt declined ADLs, reports washing herself in the shower without any assist. Educated pt on the importance of continued mobility and elevation to assist with BLE edema, she varbalized understanding. Simulated pt ambulating while holding baking pan no AD- she was CGA and maintained a steady balance while holding tray.    Extremity/Trunk Assessment              Vision       Restaurant manager, fast food Communication: No apparent difficulties   Cognition Arousal: Alert Behavior During Therapy: WFL for tasks assessed/performed Cognition: History of cognitive impairments             OT - Cognition Comments: Patient with dementia at baseline. Able to recall the year with increased time but not able to state the day of the week and claims she got into the hospital yesterday-she's been here for 4 days                 Following commands: Intact  Cueing   Cueing Techniques: Verbal cues  Exercises      Shoulder Instructions       General Comments VSS on RA    Pertinent Vitals/ Pain       Pain Assessment Pain Assessment: Faces Faces Pain Scale: Hurts little more Pain Location: back during ambulation Pain Descriptors / Indicators: Aching Pain Intervention(s): Monitored during session, Repositioned  Home Living                                          Prior Functioning/Environment              Frequency  Min 2X/week        Progress Toward Goals  OT Goals(current goals can now be found in the care plan section)  Progress towards OT goals: Progressing toward goals  Acute Rehab OT Goals Patient Stated  Goal: to get better OT Goal Formulation: With patient Time For Goal Achievement: 06/09/23 Potential to Achieve Goals: Good  Plan      Co-evaluation                 AM-PAC OT "6 Clicks" Daily Activity     Outcome Measure   Help from another person eating meals?: None Help from another person taking care of personal grooming?: A Little Help from another person toileting, which includes using toliet, bedpan, or urinal?: A Little Help from another person bathing (including washing, rinsing, drying)?: A Little Help from another person to put on and taking off regular upper body clothing?: A Little Help from another person to put on and taking off regular lower body clothing?: A Little 6 Click Score: 19    End of Session Equipment Utilized During Treatment: Gait belt;Rolling walker (2 wheels)  OT Visit Diagnosis: Unsteadiness on feet (R26.81);Muscle weakness (generalized) (M62.81);History of falling (Z91.81)   Activity Tolerance Patient tolerated treatment well   Patient Left in bed;with call bell/phone within reach (sitting EOB to eat dinner)   Nurse Communication Mobility status;Other (comment) (messaged RN/NT, needs bed alarm placed)        Time: 1700-1720 OT Time Calculation (min): 20 min  Charges: OT General Charges $OT Visit: 1 Visit OT Treatments $Therapeutic Activity: 8-22 mins  05/29/2023  AB, OTR/L  Acute Rehabilitation Services  Office: [phone]   Tristan Schroeder 05/29/2023, 6:02 PM

## 2023-05-29 NOTE — Progress Notes (Signed)
 [name] PROGRESS NOTE  Assessment/ Plan: Pt is a 78 y.o. yo female  with AKI on CKD5 secondary from AoC HFpEF exacerbation.   # AKI on CKD 5 Baseline creat 3.4- 4.1 from jan - feb 2025, eGFR 11-13, f/b Dr Signe Colt at [facility]. Creat here is 4.3, not that far from her baseline. She had significant LE edema, but clear CXR and was on RA on presentation. Pt started on IV lasix 40 bid but changed to IV 80 mg BID and added metolazone 2.5 mg daily which has been d/c.   - renal function has plateau, UOP charted at 1.25 L yesterday  - stopped diuresis 3/11  - trend renal function - strict I&Os, monitor UOP - consideration of initiation of dialysis, will need tunneled HD cath   #Acute on chronic HF w/ preserved EF - PTA on lasix 40  #Anemia on CKD, c/f GIB: GI following, plan for scope after Eliquis washout likely later this week, given Aranesp  #HTN - can resume home norvasc if needed for elevated BP  #H/o renal vein thrombosis - on eliquis, held  #DM2: per primary  #CAD: per primary    Subjective:   Sitting up in bed. States feeling well. Leg swelling improving and SOB at times.   Objective Vital signs in last 24 hours: Vitals:   05/28/23 2110 05/28/23 2148 05/29/23 0437 05/29/23 0540  BP: (!) 155/64 (!) 135/59 (!) 172/74 (!) 121/58  Pulse:   92   Resp:   18   Temp:   99.1 F (37.3 C)   TempSrc:   Oral   SpO2:   99%   Weight:   67.3 kg   Height:       Weight change: -0.862 kg  Intake/Output Summary (Last 24 hours) at 05/29/2023 0801 Last data filed at 05/29/2023 0418 Gross per 24 hour  Intake 3 ml  Output 1150 ml  Net -1147 ml       Labs: RENAL PANEL Recent Labs  Lab 05/25/23 1219 05/25/23 1535 05/25/23 2240 05/26/23 0616 05/26/23 1205 05/27/23 0516 05/28/23 0459  NA 140  --   --   --  140 140 137  K 3.9  --   --   --  4.1 4.0 3.8  CL 112*  --   --   --  112* 111 112*  CO2 15*  --   --   --  17* 19* 18*  GLUCOSE 94  --   --   --  97 93 87   BUN 88*  --   --   --  82* 89* 100*  CREATININE 4.36*  --   --   --  3.95* 4.24* 4.65*  CALCIUM 8.0*  --   --   --  8.5* 8.5* 8.1*  MG  --   --  0.9* 1.9  --   --   --   PHOS  --   --   --   --  4.5 5.6* 6.3*  ALBUMIN  --  2.5*  --   --  2.4* 2.1* 2.4*    Liver Function Tests: Recent Labs  Lab 05/25/23 1535 05/26/23 1205 05/27/23 0516 05/28/23 0459  AST 14*  --   --   --   ALT 11  --   --   --   ALKPHOS 91  --   --   --   BILITOT 0.4  --   --   --   PROT 6.5  --   --   --  ALBUMIN 2.5* 2.4* 2.1* 2.4*   No results for input(s): "LIPASE", "AMYLASE" in the last 168 hours. No results for input(s): "AMMONIA" in the last 168 hours. CBC: Recent Labs    01/03/23 1308 01/17/23 1238 01/31/23 1437 02/13/23 1332 02/28/23 1411 03/14/23 1450 04/04/23 1445 04/04/23 1446 04/18/23 1433 04/18/23 1434 05/08/23 1833 05/13/23 0358 05/13/23 0535 05/14/23 0802 05/25/23 1219 05/26/23 1205 05/27/23 0516 05/28/23 0459 05/29/23 0735  HGB 11.0*   < > 10.2*   < > 9.9*   < > 9.9*  --   --  9.4*   < >  --   --    < > 7.9* 8.4* 7.6* 7.9* 7.6*  MCV 88.7   < > 87.7   < > 85.2   < > 85.9  --   --  86.4   < >  --   --    < > 90.5 88.9 88.2 87.1 86.3  VITAMINB12 1,004*  --  1,098*  --  833  --  700  --   --  835  --  1,040*  --   --   --   --   --   --   --   FOLATE  --   --   --   --   --   --   --   --   --   --   --   --  5.3*  --   --   --   --   --   --   FERRITIN 61  --   --   --   --   --   --  153 226  --   --  220  --   --   --   --   --  246  --   TIBC  --   --   --   --   --   --   --   --   --   --   --  206*  --   --   --   --   --  207*  --   IRON  --   --   --   --   --   --   --   --   --   --   --  46  --   --   --   --   --  73  --   RETICCTPCT  --   --   --   --   --   --   --   --   --   --   --  1.5  --   --   --   --   --   --   --    < > = values in this interval not displayed.    Cardiac Enzymes: No results for input(s): "CKTOTAL", "CKMB", "CKMBINDEX", "TROPONINI" in  the last 168 hours. CBG: No results for input(s): "GLUCAP" in the last 168 hours.  Iron Studies:  Recent Labs    05/28/23 0459  IRON 73  TIBC 207*  FERRITIN 246   Studies/Results: US RENAL Result Date: 05/28/2023 CLINICAL DATA:  Chronic kidney disease. EXAM: RENAL / URINARY TRACT ULTRASOUND COMPLETE COMPARISON:  04/08/2023, 02/03/2022. FINDINGS: Right Kidney: Renal measurements: 10.3 x 5.5 x 5.5 cm = volume: 165.4 mL. Increased echogenicity is noted. Cysts are present measuring 1.0 x 0.9 x 0.8 cm and 1.2 x 1.0 x  1.1 cm. No mass or hydronephrosis visualized. Left Kidney: Renal measurements: 10.1 x 4.9 x 5.9 cm = volume: 151.7 mL. Increased echogenicity is noted. Cysts are present measuring 2.3 x 1.5 x 2.2 cm and 1.0 x 0.9 x 0.9 cm. No mass or hydronephrosis visualized. Bladder: Appears normal for degree of bladder distention. Bilateral ureteral jets are noted. Other: None. IMPRESSION: Increased renal parenchymal echogenicity and bilateral renal cysts, compatible with medical renal disease. Electronically Signed   By: Thornell Sartorius M.D.   On: 05/28/2023 21:01    Medications: Infusions:  heparin 1,150 Units/hr (05/29/23 0242)    Scheduled Medications:  colestipol  1 g Oral TID   feeding supplement  237 mL Oral BID BM   FLUoxetine  20 mg Oral Daily   folic acid  1 mg Oral Daily   lipase/protease/amylase  36,000 Units Oral TID AC   mirtazapine  7.5 mg Oral QHS   multivitamin  1 tablet Oral QHS   pantoprazole  40 mg Oral Daily   rivastigmine  1.5 mg Oral BID   sodium bicarbonate  650 mg Oral BID   sodium chloride flush  3 mL Intravenous Q12H   thiamine  100 mg Oral Daily    have reviewed scheduled and prn medications.  Physical Exam: General:NAD, comfortable sitting up in bed Heart:RRR Lungs:clear bilaterally, normal effort on room air  Abdomen: soft, non-distended Extremities: decreased b/l LE edema   Rana Snare 05/29/2023,8:01 AM  LOS: 4 days

## 2023-05-30 ENCOUNTER — Inpatient Hospital Stay (HOSPITAL_COMMUNITY): Admitting: Anesthesiology

## 2023-05-30 ENCOUNTER — Encounter (HOSPITAL_COMMUNITY)
Admission: EM | Disposition: A | Discharge status: Home Health | Payer: Self-pay | Source: Home / Self Care | Attending: Internal Medicine

## 2023-05-30 DIAGNOSIS — I2510 Atherosclerotic heart disease of native coronary artery without angina pectoris: Secondary | ICD-10-CM

## 2023-05-30 DIAGNOSIS — R1310 Dysphagia, unspecified: Secondary | ICD-10-CM | POA: Diagnosis not present

## 2023-05-30 DIAGNOSIS — K2951 Unspecified chronic gastritis with bleeding: Secondary | ICD-10-CM

## 2023-05-30 DIAGNOSIS — K5730 Diverticulosis of large intestine without perforation or abscess without bleeding: Secondary | ICD-10-CM

## 2023-05-30 DIAGNOSIS — D122 Benign neoplasm of ascending colon: Secondary | ICD-10-CM | POA: Diagnosis not present

## 2023-05-30 DIAGNOSIS — D509 Iron deficiency anemia, unspecified: Secondary | ICD-10-CM

## 2023-05-30 DIAGNOSIS — R0602 Shortness of breath: Secondary | ICD-10-CM | POA: Diagnosis not present

## 2023-05-30 HISTORY — PX: ESOPHAGOGASTRODUODENOSCOPY: SHX5428

## 2023-05-30 HISTORY — PX: COLONOSCOPY: SHX5424

## 2023-05-30 LAB — RENAL FUNCTION PANEL
Albumin: 2.2 g/dL — ABNORMAL LOW (ref 3.5–5.0)
Anion gap: 14 (ref 5–15)
BUN: 93 mg/dL — ABNORMAL HIGH (ref 8–23)
CO2: 18 mmol/L — ABNORMAL LOW (ref 22–32)
Calcium: 8.5 mg/dL — ABNORMAL LOW (ref 8.9–10.3)
Chloride: 108 mmol/L (ref 98–111)
Creatinine, Ser: 4.21 mg/dL — ABNORMAL HIGH (ref 0.44–1.00)
GFR, Estimated: 10 mL/min — ABNORMAL LOW (ref >60)
Glucose, Bld: 79 mg/dL (ref 70–99)
Phosphorus: 5.4 mg/dL — ABNORMAL HIGH (ref 2.5–4.6)
Potassium: 3.5 mmol/L (ref 3.5–5.1)
Sodium: 140 mmol/L (ref 135–145)

## 2023-05-30 LAB — CBC
HCT: 23.3 % — ABNORMAL LOW (ref 36.0–46.0)
Hemoglobin: 7.4 g/dL — ABNORMAL LOW (ref 12.0–15.0)
MCH: 27.6 pg (ref 26.0–34.0)
MCHC: 31.8 g/dL (ref 30.0–36.0)
MCV: 86.9 fL (ref 80.0–100.0)
Platelets: 289 10*3/uL (ref 150–400)
RBC: 2.68 MIL/uL — ABNORMAL LOW (ref 3.87–5.11)
RDW: 18.6 % — ABNORMAL HIGH (ref 11.5–15.5)
WBC: 9.2 10*3/uL (ref 4.0–10.5)
nRBC: 0 % (ref 0.0–0.2)

## 2023-05-30 SURGERY — EGD (ESOPHAGOGASTRODUODENOSCOPY)
Anesthesia: Monitor Anesthesia Care

## 2023-05-30 MED ORDER — TRAMADOL HCL 50 MG PO TABS
50.0000 mg | ORAL_TABLET | Freq: Four times a day (QID) | ORAL | Status: DC | PRN
  Administered 2023-05-30 – 2023-05-31 (×3): 50 mg via ORAL
  Filled 2023-05-30 (×4): qty 1

## 2023-05-30 MED ORDER — APIXABAN 2.5 MG PO TABS
2.5000 mg | ORAL_TABLET | Freq: Two times a day (BID) | ORAL | Status: DC
  Administered 2023-05-30 – 2023-06-01 (×4): 2.5 mg via ORAL
  Filled 2023-05-30 (×4): qty 1

## 2023-05-30 MED ORDER — AMLODIPINE BESYLATE 5 MG PO TABS
5.0000 mg | ORAL_TABLET | Freq: Every day | ORAL | Status: DC
  Administered 2023-05-30 – 2023-06-01 (×3): 5 mg via ORAL
  Filled 2023-05-30 (×3): qty 1

## 2023-05-30 MED ORDER — PROPOFOL 500 MG/50ML IV EMUL
INTRAVENOUS | Status: DC | PRN
  Administered 2023-05-30: 75 ug/kg/min via INTRAVENOUS

## 2023-05-30 MED ORDER — LIDOCAINE 2% (20 MG/ML) 5 ML SYRINGE
INTRAMUSCULAR | Status: DC | PRN
  Administered 2023-05-30 (×2): 20 mg via INTRAVENOUS

## 2023-05-30 MED ORDER — PROPOFOL 10 MG/ML IV BOLUS
INTRAVENOUS | Status: DC | PRN
  Administered 2023-05-30 (×2): 30 mg via INTRAVENOUS
  Administered 2023-05-30 (×2): 20 mg via INTRAVENOUS
  Administered 2023-05-30: 30 mg via INTRAVENOUS

## 2023-05-30 MED ORDER — ONDANSETRON HCL 4 MG/2ML IJ SOLN
4.0000 mg | Freq: Four times a day (QID) | INTRAMUSCULAR | Status: DC | PRN
  Administered 2023-05-30 – 2023-05-31 (×3): 4 mg via INTRAVENOUS
  Filled 2023-05-30 (×4): qty 2

## 2023-05-30 MED ORDER — SODIUM CHLORIDE 0.9 % IV SOLN
INTRAVENOUS | Status: AC | PRN
  Administered 2023-05-30: 500 mL via INTRAMUSCULAR

## 2023-05-30 NOTE — Op Note (Signed)
 [facility] Patient Name: [name] Procedure Date : 05/30/2023 MRN: [id number] Attending MD: Kathi Der , MD, [id number] Date of Birth: 1946/01/29 CSN: [id number] Age: 78 Admit Type: Inpatient Procedure:                Colonoscopy Indications:              Gastrointestinal bleeding, Incidental diarrhea noted Providers:                Kathi Der, MD, Martha Clan, RN, Alan Ripper, Technician Referring MD:              Medicines:                Sedation Administered by an Anesthesia Professional Complications:            No immediate complications. Estimated Blood Loss:     Estimated blood loss was minimal. Procedure:                Pre-Anesthesia Assessment:                           - Prior to the procedure, a History and Physical                            was performed, and patient medications and                            allergies were reviewed. The patient's tolerance of                            previous anesthesia was also reviewed. The risks                            and benefits of the procedure and the sedation                            options and risks were discussed with the patient.                            All questions were answered, and informed consent                            was obtained. Prior Anticoagulants: The patient has                            taken Eliquis (apixaban), last dose was 3 days                            prior to procedure. ASA Grade Assessment: III - A                            patient with severe systemic disease. After  reviewing the risks and benefits, the patient was                            deemed in satisfactory condition to undergo the                            procedure.                           After obtaining informed consent, the colonoscope                            was passed under direct vision. Throughout the                             procedure, the patient's blood pressure, pulse, and                            oxygen saturations were monitored continuously. The                            [id number]-HQ190L ([id number] Olympus colonoscope was                            introduced through the anus and advanced to the the                            ileocolonic anastomosis. The colonoscopy was                            performed with moderate difficulty due to                            significant looping. Successful completion of the                            procedure was aided by applying abdominal pressure.                            The patient tolerated the procedure well. The                            quality of the bowel preparation was fair. The                            terminal ileum and the rectum were photographed. Scope In: 12:10:33 PM Scope Out: 12:27:39 PM Scope Withdrawal Time: 0 hours 11 minutes 0 seconds  Total Procedure Duration: 0 hours 17 minutes 6 seconds  Findings:      The neo-terminal ileum appeared normal.      There was evidence of a prior end-to-side colo-colonic anastomosis in       the ascending colon. This was patent and was characterized by healthy       appearing mucosa. The anastomosis was traversed.      An 8 mm polyp was  found in the ascending colon. The polyp was sessile.       The polyp was removed with a cold snare. Resection was complete, but the       polyp tissue was not retrieved.      Normal mucosa was found in the entire colon. Biopsies for histology were       taken with a cold forceps from the transverse colon and descending colon       for evaluation of microscopic colitis.      Scattered diverticula were found in the sigmoid colon and descending       colon.      Internal hemorrhoids were found during retroflexion. The hemorrhoids       were small. Impression:               - Preparation of the colon was fair.                           - The examined portion of the  ileum was normal.                           - Patent end-to-side colo-colonic anastomosis,                            characterized by healthy appearing mucosa.                           - One 8 mm polyp in the ascending colon, removed                            with a cold snare. Complete resection. Polyp tissue                            not retrieved.                           - Normal mucosa in the entire examined colon.                            Biopsied.                           - Diverticulosis in the sigmoid colon and in the                            descending colon.                           - Internal hemorrhoids. Recommendation:           - Return patient to hospital ward for ongoing care.                           - Low sodium diet.                           - Continue present medications.                           -  Await pathology results.                           - Repeat colonoscopy is not recommended due to                            current age (63 years or older) for surveillance. Procedure Code(s):        --- Professional ---                           [phone], Colonoscopy, flexible; with removal of                            tumor(s), polyp(s), or other lesion(s) by snare                            technique                           [id number], 59, Colonoscopy, flexible; with biopsy,                            single or multiple Diagnosis Code(s):        --- Professional ---                           K64.8, Other hemorrhoids                           Z98.0, Intestinal bypass and anastomosis status                           D12.2, Benign neoplasm of ascending colon                           K92.2, Gastrointestinal hemorrhage, unspecified                           K57.30, Diverticulosis of large intestine without                            perforation or abscess without bleeding CPT copyright 2022 American Medical Association. All rights reserved. The codes documented  in this report are preliminary and upon coder review may  be revised to meet current compliance requirements. Kathi Der, MD Kathi Der, MD 05/30/2023 2:24:27 PM Number of Addenda: 0

## 2023-05-30 NOTE — Anesthesia Postprocedure Evaluation (Signed)
 Anesthesia Post Note  Patient: [name]  Procedure(s) Performed: EGD (ESOPHAGOGASTRODUODENOSCOPY) COLONOSCOPY POLYPECTOMY     Patient location during evaluation: PACU Anesthesia Type: MAC Level of consciousness: awake and alert Pain management: pain level controlled Vital Signs Assessment: post-procedure vital signs reviewed and stable Respiratory status: spontaneous breathing, nonlabored ventilation and respiratory function stable Cardiovascular status: stable and blood pressure returned to baseline Anesthetic complications: no   No notable events documented.  Last Vitals:  Vitals:   05/30/23 1300 05/30/23 1304  BP: (!) 180/65   Pulse: 85 86  Resp: (!) 8 11  Temp:    SpO2: 97% 98%    Last Pain:  Vitals:   05/30/23 1304  TempSrc:   PainSc: 0-No pain                 Beryle Lathe

## 2023-05-30 NOTE — Progress Notes (Signed)
 [facility] NEPHROLOGY PROGRESS NOTE  Assessment/ Plan: Pt is a 78 y.o. yo female  with history of CKD 5 admitted with acute on chronic CHF, anemia.  # Progressive CKD 5 versus AKI on CKD,  Treated with IV diuretics and metolazone with improvement of the volume status.  However, the BUN and creatinine level elevated.  US kidney with chronicity but no hydronephrosis.  Blood diuretics held last few days with stabilization of BUN and creatinine level.  No overt sign or symptoms of uremia to warrant urgent dialysis.  I have discussed with patient's daughter to review the lab results.  She is hesitant to put her in dialysis especially since this event is acute.  I will continue to hold diuretics today, as she was n.p.o. for endoscopy procedure this afternoon. Monitor urine output and labs. No urgent need for dialysis now however it might be coming very soon, family aware but not ready yet.  # Metabolic acidosis:Continue sodium bicarbonate.  # CKD-MBD/hyperphosphatemia: Started sevelamer, monitor lab.  Check PTH level.  #Anemia of CKD: Concern about GI bleed, underwent endoscopy today with no sign of bleeding.  Back on Eliquis.    Iron saturation 35, received Aranesp on 3/12.  Transfuse as needed.  # Hypertension/volume: Close to euvolemia now.  BP elevated.  Resume low-dose of amlodipine.  Likely need to resume oral diuretics by tomorrow.  Subjective: Seen and examined at bedside.  Came back from endoscopy.  Denies nausea, vomiting, dysphagia, chest pain or shortness of breath. Objective Vital signs in last 24 hours: Vitals:   05/30/23 1250 05/30/23 1300 05/30/23 1304 05/30/23 1427  BP: (!) 174/90 (!) 180/65  (!) 180/83  Pulse: 86 85 86 91  Resp: 19 (!) 8 11 17   Temp:    98.1 F (36.7 C)  TempSrc:    Oral  SpO2: 97% 97% 98%   Weight:      Height:       Weight change: -0.181 kg  Intake/Output Summary (Last 24 hours) at 05/30/2023 1557 Last data filed at 05/30/2023  1240 Gross per 24 hour  Intake 1160 ml  Output --  Net 1160 ml       Labs: RENAL PANEL Recent Labs  Lab 05/25/23 2240 05/26/23 0616 05/26/23 1205 05/27/23 0516 05/28/23 0459 05/29/23 0735 05/30/23 0404  NA  --   --  140 140 137 140 140  K  --   --  4.1 4.0 3.8 4.0 3.5  CL  --   --  112* 111 112* 107 108  CO2  --   --  17* 19* 18* 22 18*  GLUCOSE  --   --  97 93 87 92 79  BUN  --   --  82* 89* 100* 105* 93*  CREATININE  --   --  3.95* 4.24* 4.65* 4.45* 4.21*  CALCIUM  --   --  8.5* 8.5* 8.1* 8.6* 8.5*  MG 0.9* 1.9  --   --   --   --   --   PHOS  --   --  4.5 5.6* 6.3* 5.5* 5.4*  ALBUMIN  --   --  2.4* 2.1* 2.4* 2.3* 2.2*    Liver Function Tests: Recent Labs  Lab 05/25/23 1535 05/26/23 1205 05/28/23 0459 05/29/23 0735 05/30/23 0404  AST 14*  --   --   --   --   ALT 11  --   --   --   --   ALKPHOS 91  --   --   --   --  BILITOT 0.4  --   --   --   --   PROT 6.5  --   --   --   --   ALBUMIN 2.5*   < > 2.4* 2.3* 2.2*   < > = values in this interval not displayed.   No results for input(s): "LIPASE", "AMYLASE" in the last 168 hours. No results for input(s): "AMMONIA" in the last 168 hours. CBC: Recent Labs    01/03/23 1308 01/17/23 1238 01/31/23 1437 02/13/23 1332 02/28/23 1411 03/14/23 1450 04/04/23 1445 04/04/23 1446 04/18/23 1433 04/18/23 1434 05/08/23 1833 05/13/23 0358 05/13/23 0535 05/14/23 0802 05/26/23 1205 05/27/23 0516 05/28/23 0459 05/29/23 0735 05/30/23 0404  HGB 11.0*   < > 10.2*   < > 9.9*   < > 9.9*  --   --  9.4*   < >  --   --    < > 8.4* 7.6* 7.9* 7.6* 7.4*  MCV 88.7   < > 87.7   < > 85.2   < > 85.9  --   --  86.4   < >  --   --    < > 88.9 88.2 87.1 86.3 86.9  VITAMINB12 1,004*  --  1,098*  --  833  --  700  --   --  835  --  1,040*  --   --   --   --   --   --   --   FOLATE  --   --   --   --   --   --   --   --   --   --   --   --  5.3*  --   --   --   --   --   --   FERRITIN 61  --   --   --   --   --   --  153 226  --    --  220  --   --   --   --  246  --   --   TIBC  --   --   --   --   --   --   --   --   --   --   --  206*  --   --   --   --  207*  --   --   IRON  --   --   --   --   --   --   --   --   --   --   --  46  --   --   --   --  73  --   --   RETICCTPCT  --   --   --   --   --   --   --   --   --   --   --  1.5  --   --   --   --   --   --   --    < > = values in this interval not displayed.    Cardiac Enzymes: No results for input(s): "CKTOTAL", "CKMB", "CKMBINDEX", "TROPONINI" in the last 168 hours. CBG: No results for input(s): "GLUCAP" in the last 168 hours.  Iron Studies:  Recent Labs    05/28/23 0459  IRON 73  TIBC 207*  FERRITIN 246   Studies/Results: US RENAL Result Date: 05/28/2023 CLINICAL DATA:  Chronic kidney disease.  EXAM: RENAL / URINARY TRACT ULTRASOUND COMPLETE COMPARISON:  04/08/2023, 02/03/2022. FINDINGS: Right Kidney: Renal measurements: 10.3 x 5.5 x 5.5 cm = volume: 165.4 mL. Increased echogenicity is noted. Cysts are present measuring 1.0 x 0.9 x 0.8 cm and 1.2 x 1.0 x 1.1 cm. No mass or hydronephrosis visualized. Left Kidney: Renal measurements: 10.1 x 4.9 x 5.9 cm = volume: 151.7 mL. Increased echogenicity is noted. Cysts are present measuring 2.3 x 1.5 x 2.2 cm and 1.0 x 0.9 x 0.9 cm. No mass or hydronephrosis visualized. Bladder: Appears normal for degree of bladder distention. Bilateral ureteral jets are noted. Other: None. IMPRESSION: Increased renal parenchymal echogenicity and bilateral renal cysts, compatible with medical renal disease. Electronically Signed   By: Thornell Sartorius M.D.   On: 05/28/2023 21:01    Medications: Infusions:   Scheduled Medications:  apixaban  2.5 mg Oral BID   colestipol  1 g Oral TID   feeding supplement  237 mL Oral BID BM   FLUoxetine  20 mg Oral Daily   folic acid  1 mg Oral Daily   lipase/protease/amylase  36,000 Units Oral TID AC   mirtazapine  7.5 mg Oral QHS   multivitamin  1 tablet Oral QHS   pantoprazole  40 mg Oral  Daily   rivastigmine  1.5 mg Oral BID   sevelamer carbonate  800 mg Oral TID WC   sodium bicarbonate  650 mg Oral BID   sodium chloride flush  3 mL Intravenous Q12H   thiamine  100 mg Oral Daily    have reviewed scheduled and prn medications.  Physical Exam: General:NAD, comfortable Heart:RRR, s1s2 nl Lungs:clear b/l, no crackle Abdomen:soft, Non-tender, non-distended Extremities:No edema Neurology: Alert awake and following commands.  [name] 05/30/2023,3:57 PM  LOS: 5 days

## 2023-05-30 NOTE — Anesthesia Preprocedure Evaluation (Addendum)
 Anesthesia Evaluation  Patient identified by MRN, date of birth, ID band Patient awake    Reviewed: Allergy & Precautions, NPO status , Patient's Chart, lab work & pertinent test results  History of Anesthesia Complications Negative for: history of anesthetic complications  Airway Mallampati: II  TM Distance: >3 FB Neck ROM: Full    Dental  (+) Upper Dentures, Lower Dentures   Pulmonary neg pulmonary ROS   Pulmonary exam normal        Cardiovascular hypertension, Pt. on medications + CAD, + Cardiac Stents and + DVT  Normal cardiovascular exam   '25 TTE - EF 60 to 65%. There is moderate left ventricular hypertrophy. Grade I diastolic dysfunction (impaired relaxation). Mild mitral valve regurgitation.     Neuro/Psych  PSYCHIATRIC DISORDERS  Depression    CVA, No Residual Symptoms    GI/Hepatic Neg liver ROS,GERD  Medicated and Controlled,,  Endo/Other  diabetes, Insulin Dependent    Renal/GU CRFRenal disease     Musculoskeletal  (+) Arthritis ,    Abdominal   Peds  Hematology  (+) Blood dyscrasia, anemia  On eliquis    Anesthesia Other Findings Lupus   Reproductive/Obstetrics  Breast cancer                              Anesthesia Physical Anesthesia Plan  ASA: 3  Anesthesia Plan: MAC   Post-op Pain Management: Minimal or no pain anticipated   Induction:   PONV Risk Score and Plan: 2 and Propofol infusion and Treatment may vary due to age or medical condition  Airway Management Planned: Nasal Cannula and Natural Airway  Additional Equipment: None  Intra-op Plan:   Post-operative Plan:   Informed Consent: I have reviewed the patients History and Physical, chart, labs and discussed the procedure including the risks, benefits and alternatives for the proposed anesthesia with the patient or authorized representative who has indicated his/her understanding and acceptance.        Plan Discussed with: CRNA and Anesthesiologist  Anesthesia Plan Comments:         Anesthesia Quick Evaluation

## 2023-05-30 NOTE — Progress Notes (Signed)
 PROGRESS NOTE [name]  XBJ:[id number] DOB: Jul 31, 1945 DOA: 05/25/2023 PCP: Renford Dills, MD   Brief Narrative/Hospital Course: 78 year old female with history of HTN, CAD, CKD stage IV lower extremity DVT/renal artery thrombosis on Eliquis, chronic HFpEF, depression/memory loss, chronic diarrhea following up with GI presented to the ED with 11 pound weight gain in [redacted] week along with lower leg edema and shortness of breath despite taking extra Lasix dose. In the ED-hemodynamically stable, on room air afebrile- felt to have volume overload BNP 336 troponin 19, hemoglobin 7.9- chest x-ray without acute cardiopulmonary finding, patient was given Lasix, nephrology consulted and admitted for further management.  HD on hold. Planning for EGD/colonoscopy 3/13  Consultation: Nephrology Gastroenterology Cardiology  Subjective: Awaiting EGD/colonscopy No overnight events but could not finish prep for colonoscopy  Assessment and Plan: Principal Problem:   SOB (shortness of breath) Active Problems:   Acute kidney injury superimposed on chronic kidney disease (HCC)   Acute on chronic heart failure with preserved ejection fraction (HFpEF) (HCC)   Benign essential HTN   CAD (coronary artery disease)   Renal vein thrombosis (HCC)   Type 2 diabetes mellitus (HCC)   Anemia   AKI on CKD stage V vs progressive CKD Metabolic acidosis Fluid overload: - creat 3.4-4.1 from jan-feb 2025 per nephrology with EGFR 11-13, patient with volume overload clinically, nephrology following and managing -monitor renal function.  - Nephrology discussed  potentially starting dialysis-no obvious signs of uremia-daughter hesitant on dialysis and at this time monitoring closely by nephrology as below.   Acute on chronic CHF with preserved EF-EF on Lasix 40 daily Demand ischemia: BNP 336 on admission with troponin 19> 23-indicating demand ischemia from CHF. Received high-dose Lasix and currently on hold Last  echo 10/24/2021 with EF 60-65%, moderately elevated pulmonary artery systolic pressure. Repeat echo -EF 60-65%, G1 DD normal RV systolic function.  Cardiology signed off  Acute on chronic anemia with maroon-colored blood per stool Chronic diarrhea with recent worsening of symptoms Anemia from chronic kidney disease and folate deficiency anemia r/o GI bleeding: Hb baseline around 8 to 10 g in last few months. Last anemia panel from 05/09/2023 with normal B12 and folate deficiency-folate added., on aranesp. Hemoccult test negative but due to ongoing anemia GI consulted they are planning for EGD/colonoscopy  3/13  - holding Eliquis -aranesp given   History of renal thrombosis History of DVT: -Holding Eliquis for procedure and transitioned to heparin drip pharmacy monitoring.  CAD s/p remote PCI to LAD in 2000 Hypertension HLD: No chest pain.  BP well-controlled.Allow  BP to be on higher side for diuresis/ckd.PTA on amlodipine-on hold for now Intolerant to statin, no aspirin due to need for anticoagulation. Cardiology signed off.  Diabetes mellitus: Last A1c 6.2 05/08/2023. PTA not on meds.  Monitor intermittently No results for input(s): "GLUCAP", "HGBA1C" in the last 168 hours.   Hypomagnesemia: Resolved  Depression Memory loss: Patient followed by neuropsychiatry team has appointment coming at [facility]  Continue her rivastigmine, folic acid, Prozac thiamine and remeron.  Moderate malnutrition: Augment diet as tolerated. Nutrition Problem: Moderate Malnutrition Etiology: acute illness Signs/Symptoms: mild fat depletion, mild muscle depletion, energy intake < 75% for > 7 days Interventions: Refer to RD note for recommendations   DVT prophylaxis: eluquis on hold Code Status:   Code Status: Full Code Family Communication: plan of care discussed with patient/daughter at bedside. Patient status is: Remains hospitalized because of severity of illness Level of care: Telemetry  Cardiac   Dispo: The  patient is from: Home            Anticipated disposition:TBD  Objective: Vitals last 24 hrs: Vitals:   05/29/23 1944 05/30/23 0524 05/30/23 0608 05/30/23 0958  BP: (!) 143/74 (!) 165/60 (!) 160/60 (!) 188/72  Pulse: 94 83    Resp: 18 16 19 15   Temp: 97.9 F (36.6 C) 98 F (36.7 C)  (!) 97 F (36.1 C)  TempSrc: Oral Oral  Temporal  SpO2: 98% 95% 96% 99%  Weight:  67.1 kg  67.1 kg  Height:       Weight change: -0.181 kg  Physical Examination:  General: Appearance:     Overweight female in no acute distress     Lungs:     respirations unlabored  Heart:    Normal heart rate. + LE edema but improved   MS:   All extremities are intact.   Neurologic:   Awake, alert     Medications reviewed:  Scheduled Meds:  [MAR Hold] colestipol  1 g Oral TID   [MAR Hold] feeding supplement  237 mL Oral BID BM   [MAR Hold] FLUoxetine  20 mg Oral Daily   [MAR Hold] folic acid  1 mg Oral Daily   [MAR Hold] lipase/protease/amylase  36,000 Units Oral TID AC   [MAR Hold] mirtazapine  7.5 mg Oral QHS   [MAR Hold] multivitamin  1 tablet Oral QHS   [MAR Hold] pantoprazole  40 mg Oral Daily   [MAR Hold] rivastigmine  1.5 mg Oral BID   [MAR Hold] sevelamer carbonate  800 mg Oral TID WC   [MAR Hold] sodium bicarbonate  650 mg Oral BID   [MAR Hold] sodium chloride flush  3 mL Intravenous Q12H   [MAR Hold] thiamine  100 mg Oral Daily  Continuous Infusions:  sodium chloride 500 mL (05/30/23 1000)     Diet Order             Diet NPO time specified Except for: Sips with Meds  Diet effective midnight           Diet NPO time specified  Diet effective midnight                  Intake/Output Summary (Last 24 hours) at 05/30/2023 1046 Last data filed at 05/30/2023 0600 Gross per 24 hour  Intake 1400.63 ml  Output 300 ml  Net 1100.63 ml   Net IO Since Admission: -2,668.17 mL [05/30/23 1046]  Wt Readings from Last 3 Encounters:  05/30/23 67.1 kg  05/17/23 71.1 kg   05/14/23 71.1 kg     Data Reviewed: I have personally reviewed following labs and imaging studies CBC: Recent Labs  Lab 05/26/23 1205 05/27/23 0516 05/28/23 0459 05/29/23 0735 05/30/23 0404  WBC 8.7 8.6 9.2 8.6 9.2  HGB 8.4* 7.6* 7.9* 7.6* 7.4*  HCT 27.2* 24.6* 24.9* 23.9* 23.3*  MCV 88.9 88.2 87.1 86.3 86.9  PLT 300 263 282 267 289   Basic Metabolic Panel:  Recent Labs  Lab 05/25/23 2240 05/26/23 0616 05/26/23 1205 05/27/23 0516 05/28/23 0459 05/29/23 0735 05/30/23 0404  NA  --   --  140 140 137 140 140  K  --   --  4.1 4.0 3.8 4.0 3.5  CL  --   --  112* 111 112* 107 108  CO2  --   --  17* 19* 18* 22 18*  GLUCOSE  --   --  97 93 87 92 79  BUN  --   --  82* 89* 100* 105* 93*  CREATININE  --   --  3.95* 4.24* 4.65* 4.45* 4.21*  CALCIUM  --   --  8.5* 8.5* 8.1* 8.6* 8.5*  MG 0.9* 1.9  --   --   --   --   --   PHOS  --   --  4.5 5.6* 6.3* 5.5* 5.4*  GFR: Estimated Creatinine Clearance: 10.9 mL/min (A) (by C-G formula based on SCr of 4.21 mg/dL (H)).  Liver Function Tests:  Recent Labs  Lab 05/25/23 1535 05/26/23 1205 05/27/23 0516 05/28/23 0459 05/29/23 0735 05/30/23 0404  AST 14*  --   --   --   --   --   ALT 11  --   --   --   --   --   ALKPHOS 91  --   --   --   --   --   BILITOT 0.4  --   --   --   --   --   PROT 6.5  --   --   --   --   --   ALBUMIN 2.5* 2.4* 2.1* 2.4* 2.3* 2.2*   Antimicrobials/Microbiology: Anti-infectives (From admission, onward)    None         Component Value Date/Time   SDES BLOOD BLOOD LEFT ARM 07/29/2022 0236   SPECREQUEST  07/29/2022 0236    BOTTLES DRAWN AEROBIC AND ANAEROBIC Blood Culture adequate volume   CULT  07/29/2022 0236    NO GROWTH 5 DAYS Performed at [facility], [address], [place], [place] [address]    REPTSTATUS 08/03/2022 FINAL 07/29/2022 0236  Radiology Studies: US RENAL Result Date: 05/28/2023 CLINICAL DATA:  Chronic kidney disease. EXAM: RENAL / URINARY TRACT ULTRASOUND COMPLETE  COMPARISON:  04/08/2023, 02/03/2022. FINDINGS: Right Kidney: Renal measurements: 10.3 x 5.5 x 5.5 cm = volume: 165.4 mL. Increased echogenicity is noted. Cysts are present measuring 1.0 x 0.9 x 0.8 cm and 1.2 x 1.0 x 1.1 cm. No mass or hydronephrosis visualized. Left Kidney: Renal measurements: 10.1 x 4.9 x 5.9 cm = volume: 151.7 mL. Increased echogenicity is noted. Cysts are present measuring 2.3 x 1.5 x 2.2 cm and 1.0 x 0.9 x 0.9 cm. No mass or hydronephrosis visualized. Bladder: Appears normal for degree of bladder distention. Bilateral ureteral jets are noted. Other: None. IMPRESSION: Increased renal parenchymal echogenicity and bilateral renal cysts, compatible with medical renal disease. Electronically Signed   By: Thornell Sartorius M.D.   On: 05/28/2023 21:01   LOS: 5 days   Total time spent in review of labs and imaging, patient evaluation, formulation of plan, documentation and communication with family: 35 minutes  Joseph Art, DO  [facility]  05/30/2023, 10:46 AM

## 2023-05-30 NOTE — Transfer of Care (Signed)
 Immediate Anesthesia Transfer of Care Note  Patient: [name]  Procedure(s) Performed: EGD (ESOPHAGOGASTRODUODENOSCOPY) COLONOSCOPY POLYPECTOMY  Patient Location: PACU  Anesthesia Type:MAC  Level of Consciousness: awake, alert , and oriented  Airway & Oxygen Therapy: Patient Spontanous Breathing and Patient connected to nasal cannula oxygen  Post-op Assessment: Report given to RN and Post -op Vital signs reviewed and stable  Post vital signs: Reviewed and stable  Last Vitals:  Vitals Value Taken Time  BP 163/70 05/30/23 1238  Temp 35.9 C 05/30/23 1238  Pulse 83 05/30/23 1240  Resp 13 05/30/23 1240  SpO2 99 % 05/30/23 1240  Vitals shown include unfiled device data.  Last Pain:  Vitals:   05/30/23 1238  TempSrc: Temporal  PainSc: 0-No pain      Patients Stated Pain Goal: 0 (05/26/23 2005)  Complications: No notable events documented.

## 2023-05-30 NOTE — Progress Notes (Signed)
 Physical Therapy Treatment Patient Details Name: [name] MRN: [id number] DOB: [birth year] Today's Date: 05/30/2023   History of Present Illness Patient is a 78 yo female presenting to the ED with weight gain of 11 pounds in 1 week on 05/25/23. Admitted with SoB and AKI. Patient with multiple admissions PMH:  CKD IV, HTN, CHF, CAD, DVT, CVA, DM2, gout, chronic gastritis, breast CA, dementia, depression.    PT Comments  Pt resting in bed on arrival and agreeable to session with continued progress towards acute mobility goals. Pt demonstrating increased activity tolerance this session, progressing gait distance with RW for support and grossly CGA for safety. Pt continues to benefit from cues for RW safety and management. Pt continues to benefit from skilled PT services to progress toward functional mobility goals.      If plan is discharge home, recommend the following: Supervision due to cognitive status;Direct supervision/assist for financial management;Assistance with cooking/housework;Help with stairs or ramp for entrance;Direct supervision/assist for medications management;Assist for transportation   Can travel by private vehicle        Equipment Recommendations  None recommended by PT    Recommendations for Other Services       Precautions / Restrictions Precautions Precautions: Fall Recall of Precautions/Restrictions: Impaired Precaution/Restrictions Comments: memory impairment Restrictions Weight Bearing Restrictions Per Provider Order: No     Mobility  Bed Mobility Overal bed mobility: Needs Assistance Bed Mobility: Supine to Sit   Sidelying to sit: Supervision       General bed mobility comments: supervision for safety with slightly increased time to complete    Transfers Overall transfer level: Needs assistance Equipment used: Rolling walker (2 wheels) Transfers: Sit to/from Stand Sit to Stand: Contact guard assist           General transfer  comment: CGA for safety.    Ambulation/Gait Ambulation/Gait assistance: Contact guard assist Gait Distance (Feet): 375 Feet Assistive device: Rolling walker (2 wheels) Gait Pattern/deviations: Step-through pattern Gait velocity: slightly decreased     General Gait Details: no LOB, verbal cues to stay in walker as pt with tendency to push too far anterior and keep self to L   Stairs             Wheelchair Mobility     Tilt Bed    Modified Rankin (Stroke Patients Only)       Balance Overall balance assessment: Needs assistance Sitting-balance support: Feet supported Sitting balance-Leahy Scale: Good     Standing balance support: During functional activity Standing balance-Leahy Scale: Fair Standing balance comment: benefits form UE support                            Communication Communication Communication: No apparent difficulties  Cognition Arousal: Alert Behavior During Therapy: WFL for tasks assessed/performed   PT - Cognitive impairments: History of cognitive impairments, Memory, Attention                       PT - Cognition Comments: pt with hx of dementia per daughter, follows simple commands throughout session with extra time PRN. Following commands: Intact      Cueing Cueing Techniques: Verbal cues  Exercises      General Comments General comments (skin integrity, edema, etc.): VSS on RA      Pertinent Vitals/Pain Pain Assessment Pain Assessment: Faces Faces Pain Scale: Hurts little more Pain Location: low back Pain Descriptors / Indicators: Aching Pain Intervention(s):  Repositioned, Patient requesting pain meds-RN notified, Monitored during session, Limited activity within patient's tolerance    Home Living                          Prior Function            PT Goals (current goals can now be found in the care plan section) Acute Rehab PT Goals Patient Stated Goal: return home PT Goal Formulation:  With patient Time For Goal Achievement: 06/09/23 Progress towards PT goals: Progressing toward goals    Frequency    Min 2X/week      PT Plan      Co-evaluation              AM-PAC PT "6 Clicks" Mobility   Outcome Measure  Help needed turning from your back to your side while in a flat bed without using bedrails?: A Little Help needed moving from lying on your back to sitting on the side of a flat bed without using bedrails?: A Little Help needed moving to and from a bed to a chair (including a wheelchair)?: A Little Help needed standing up from a chair using your arms (e.g., wheelchair or bedside chair)?: A Little Help needed to walk in hospital room?: A Little Help needed climbing 3-5 steps with a railing? : A Little 6 Click Score: 18    End of Session Equipment Utilized During Treatment: Gait belt Activity Tolerance: Patient tolerated treatment well Patient left: in chair;with call bell/phone within reach Nurse Communication: Mobility status;Patient requests pain meds PT Visit Diagnosis: Muscle weakness (generalized) (M62.81);Difficulty in walking, not elsewhere classified (R26.2)     Time: 3086-5784 PT Time Calculation (min) (ACUTE ONLY): 18 min  Charges:    $Gait Training: 8-22 mins PT General Charges $$ ACUTE PT VISIT: 1 Visit                     Sherea Liptak R. PTA Acute Rehabilitation Services Office: [phone]   Catalina Antigua 05/30/2023, 4:16 PM

## 2023-05-30 NOTE — Progress Notes (Signed)
 PHARMACY - ANTICOAGULATION CONSULT NOTE  Pharmacy Consult for Heparin> apixaban Indication: h/o DVT/renal artery thrombosis (Eliquis pta)  Allergies  Allergen Reactions   Anesthetics, Halogenated Other (See Comments)    Per patient's daughter, she can not tolerate anesthetics.    Etomidate Other (See Comments)    Heart stopped during anesthesia   Fentanyl Other (See Comments)    Heart stopped during anesthesia    Percocet [Oxycodone-Acetaminophen] Other (See Comments)    Headaches    Diazepam Other (See Comments)    Agitation   Lorazepam Nausea Only   Tramadol Hcl Nausea And Vomiting   Haloperidol Nausea And Vomiting    **Haldol**   Morphine Sulfate Nausea Only    agitation   Propoxyphene Nausea And Vomiting    **Darvocet** no longer available   Versed [Midazolam] Other (See Comments)    Heart stopped during anesthesia    Patient Measurements: Height: 5\' 4"  (162.6 cm) Weight: 67.1 kg (147 lb 14.9 oz) IBW/kg (Calculated) : 54.7 Heparin Dosing Weight: 68 kg  Vital Signs: Temp: 98.1 F (36.7 C) (03/13 1427) Temp Source: Oral (03/13 1427) BP: 180/83 (03/13 1427) Pulse Rate: 91 (03/13 1427)  Labs: Recent Labs    05/28/23 0459 05/29/23 0011 05/29/23 0735 05/29/23 0855 05/30/23 0404  HGB 7.9*  --  7.6*  --  7.4*  HCT 24.9*  --  23.9*  --  23.3*  PLT 282  --  267  --  289  APTT  --  40*  --  53*  --   HEPARINUNFRC  --  1.06*  --  >1.10*  --   CREATININE 4.65*  --  4.45*  --  4.21*    Estimated Creatinine Clearance: 10.9 mL/min (A) (by C-G formula based on SCr of 4.21 mg/dL (H)).   Medical History: Past Medical History:  Diagnosis Date   Abdominal discomfort    Chronic N/V/D. Presumptive dx Crohn's dx per elevated p ANCA. Failed Entocort and Pentasa. Sep 2003 - ileocolectomy c anastomosis per Dr Orson Slick 2/2 adhesions - path was hegative for Crohns. EGD, Sm bowel follow through (11/03), and an eteroclysis (10/03) were unrevealing. Cuases hypomag and  hypocalcemia.   Adnexal mass 10/2001   s/p lap BSO (R ovarian fibroma) & lysis of adhesions   Allergy    Seasonal   Anemia    Multifactorial. Baseline HgB 10-11 ish. B12 def - 150 in 3/10. Fe Def - ferritin 35 3/10. Both are being repleted.   Breast cancer (HCC) 03/16/2013   right, 5 o'clock   CAD (coronary artery disease) 1996   1996 - PTCA and angioplasty diagonal branch. 2000 - Rotoblator & angiopllasty of diagonal. 2006 - subendocardial AMI, DES to proximal LAD.Marland Kitchen Also had 90% stenosis in distal apical LAD. EF 55 with apical hypokinesis. Indefinite ASA and Plavix.   CHF (congestive heart failure) (HCC)    Chronic kidney disease, stage 4 (severe) (HCC)    Chronic pain    CT 10/10 = Spinal stenosis L2 - S1.   COVID-19 11/02/2020   Diabetes mellitus    Insulin dependent   Diabetes mellitus type 2 in obese 05/03/2010   Managed on lantus and novolog. Has diabetic nephropathy. Metformin D/C'd 2012 2/2 creatinine.  No diabetic retinopathy per 3/11.    Gout    Hx of radiation therapy 06/02/13- 07/16/13   right rbeast 4500 cGy 25 sessions, right breast boost 1600 cGy in 8 sessions   Hyperlipidemia    Managed with both a statin and Welchol. Welchol  stopped 2014 2/2 cost and started on fenofibrate    Hypertension    2006 B renal arteries patent. 2003 MRA - no RAS. 2003 pheo W/U Dr Caryn Section reportedly negative.   Hypoxia 07/23/2017   Lupus    Lymphedema of breast    Personal history of radiation therapy 2015   Pneumonia of left lung due to infectious organism    RBBB    Renal vein thrombosis (HCC)    SBO (small bowel obstruction) (HCC) 09/17/2017   Secondary hyperparathyroidism (HCC)    Stroke [facility])    Incidental finding MRI 2002 L lacunar infarct   Vitamin B12 deficiency    Vitamin D deficiency    Wears dentures    top    Medications:  Medications Prior to Admission  Medication Sig Dispense Refill Last Dose/Taking   acetaminophen (TYLENOL) 650 MG CR tablet Take 1,300 mg by mouth every  8 (eight) hours as needed for pain.   05/24/2023 Evening   amLODipine (NORVASC) 10 MG tablet Take 1 tablet (10 mg total) by mouth daily. 90 tablet 3 05/25/2023   apixaban (ELIQUIS) 2.5 MG TABS tablet Take 1 tablet (2.5 mg total) by mouth 2 (two) times daily. 60 tablet 0 05/25/2023 at  8:30 AM   cephALEXin (KEFLEX) 500 MG capsule Take 1 capsule (500 mg total) by mouth daily for 14 days. 14 capsule 0 05/25/2023 Morning   colestipol (COLESTID) 1 g tablet Take 1 tablet (1 g total) by mouth 3 (three) times daily. 90 tablet 2 05/25/2023 Morning   diclofenac Sodium (VOLTAREN) 1 % GEL Apply 1 Application topically 4 (four) times daily as needed (knee pain).   05/24/2023   diphenoxylate-atropine (LOMOTIL) 2.5-0.025 MG tablet Take 1 tablet by mouth 2 (two) times daily as needed for diarrhea or loose stools. 30 tablet 1 Past Week   [EXPIRED] doxycycline (VIBRAMYCIN) 100 MG capsule Take 1 capsule (100 mg total) by mouth 2 (two) times daily for 10 days. 20 capsule 0 05/25/2023 Morning   famotidine (PEPCID) 20 MG tablet Take 20 mg by mouth daily as needed for heartburn or indigestion.   Taking As Needed   FLUoxetine (PROZAC) 20 MG capsule Take 20 mg by mouth daily.   05/25/2023 Morning   folic acid (FOLVITE) 1 MG tablet Take 1 tablet (1 mg total) by mouth daily. 30 tablet 2 05/25/2023 Morning   furosemide (LASIX) 40 MG tablet Take 1 tablet (40 mg total) by mouth daily. 90 tablet 2 05/25/2023 Morning   gabapentin (NEURONTIN) 300 MG capsule Take 300 mg by mouth at bedtime.   05/24/2023 Bedtime   HYDROcodone-acetaminophen (NORCO/VICODIN) 5-325 MG tablet Take 2 tablets by mouth every 6 (six) hours as needed. 10 tablet 0 Past Month   lipase/protease/amylase (CREON) 36000 UNITS CPEP capsule Take 1 capsule (36,000 Units total) by mouth 3 (three) times daily before meals. 180 capsule 2 05/25/2023 Morning   loperamide (IMODIUM) 2 MG capsule Take 1 capsule (2 mg total) by mouth as needed for diarrhea or loose stools. 30 capsule 0 Past Week    mirtazapine (REMERON) 7.5 MG tablet Take 7.5 mg by mouth at bedtime.   05/24/2023 Bedtime   nitroGLYCERIN (NITROSTAT) 0.4 MG SL tablet DISSOLVE 1 TABLET UNDER THE TONGUE EVERY 5 MINUTES AS  NEEDED FOR CHEST PAIN. MAX  OF 3 TABLETS IN 15 MINUTES. CALL 911 IF PAIN PERSISTS. 25 tablet 0 Taking   pantoprazole (PROTONIX) 40 MG tablet Take 1 tablet (40 mg total) by mouth 2 (two) times daily. (Patient taking  differently: Take 40 mg by mouth daily.) 30 tablet 0 05/25/2023 Morning   rivastigmine (EXELON) 1.5 MG capsule Take 1 capsule (1.5 mg total) by mouth 2 (two) times daily. 60 capsule 6 05/25/2023 Morning   tiZANidine (ZANAFLEX) 2 MG tablet Take 2 mg by mouth at bedtime as needed.   Past Month    Assessment: 78 y.o. F presents with maroon colored stool. Pt on Eliquis pta for h/o lower extremity DVT and renal artery stenosis which was initially continued here. Last dose taken 3/10 0830. GI following and Eliquis to be on hold. To start heparin bridge and plan for EGD/colonoscopy on Thursday. Eliquis will be affecting heparin level so will utilize aPTT for monitoring until levels correlate. Hgb low but relatively stable this admission.  Heparin drip stopped this am prior to colonoscopy and endoscopy  Patient has history of VTE on chronic low dose apixaban pta - hgb low but stable 7.6 with maroon stool  Per GI polyps removed but no bleeding noted > ok to resume apixaban   Goal of Therapy:  Heparin level 0.3-0.7 units/ml aPTT 66-102 seconds Monitor platelets by anticoagulation protocol: Yes   Plan:  Stop heparin  Begin apixaban 2.5mg  BID  Monitor cbc and s/s bleeding     Leota Sauers Pharm.D. CPP, BCPS Clinical Pharmacist [phone] 05/30/2023 2:35 PM

## 2023-05-30 NOTE — Brief Op Note (Signed)
 05/30/2023  12:35 PM  PATIENT:  [name]  78 y.o. female  PRE-OPERATIVE DIAGNOSIS:  Anemia, diarrhea  POST-OPERATIVE DIAGNOSIS:  Upper: gastriis, Lower: random colon Bx-r/o colitis, polyp- cold snare- unable to retrieve  PROCEDURE:  Procedure(s): EGD (ESOPHAGOGASTRODUODENOSCOPY) (N/A) COLONOSCOPY (N/A) POLYPECTOMY  SURGEON:  Surgeons and Role:    * Berle Fitz, Darcus Austin, MD - Primary  PHYSICIAN ASSISTANT:   ASSISTANTS: {ASSISTANTS:31801}   ANESTHESIA:   {procedures; anesthesia:812}  EBL:  {None/Minimal: 21241}   BLOOD ADMINISTERED:{BLOOD GIVEN TYPES AND AMOUNTS:20467}  DRAINS: {Devices; drains:31758}   LOCAL MEDICATIONS USED:  {LOCAL MEDICATIONS:10721995}  SPECIMEN:  {ONC STAGING; AJCC TYPE OF SPECIMEN:115600001}  DISPOSITION OF SPECIMEN:  {SPECIMEN DISPOSITION:204680}  COUNTS:  {OR COUNTS CORRECT/INCORRECT:204690}  TOURNIQUET:  * No tourniquets in log *  DICTATION: .{DICTATION TYPES:[phone]}  PLAN OF CARE: {OPTIME PLAN OF WGNF:[id number]}  PATIENT DISPOSITION:  {op note disposition:31782}   Delay start of Pharmacological VTE agent (>24hrs) due to surgical blood loss or risk of bleeding: {YES/NO/NOT APPLICABLE:20182}

## 2023-05-30 NOTE — Progress Notes (Signed)
 [facility] Progress Note  [name] 78 y.o. 1945-03-22  CC: Anemia, diarrhea   Subjective: Patient seen and examined at bedside.  No acute issues overnight.  According to nursing staff notes, she did not finish her prep.  Heparin on hold.  ROS : Afebrile, negative for chest pain   Objective: Vital signs in last 24 hours: Vitals:   05/30/23 0608 05/30/23 0958  BP: (!) 160/60 (!) 188/72  Pulse:    Resp: 19 15  Temp:  (!) 97 F (36.1 C)  SpO2: 96% 99%    Physical Exam:  General:   Alert,  Well-developed, well-nourished, pleasant and cooperative in NAD Lungs: No visible respiratory distress Heart:  Regular rate and rhythm; no murmurs, clicks, rubs,  or gallops. Abdomen: Soft, nontender, nondistended, bowel sound present, no peritoneal signs Lower extremity edema noted Mood and affect normal Alert and oriented x 3   Lab Results: Recent Labs    05/29/23 0735 05/30/23 0404  NA 140 140  K 4.0 3.5  CL 107 108  CO2 22 18*  GLUCOSE 92 79  BUN 105* 93*  CREATININE 4.45* 4.21*  CALCIUM 8.6* 8.5*  PHOS 5.5* 5.4*   Recent Labs    05/29/23 0735 05/30/23 0404  ALBUMIN 2.3* 2.2*   Recent Labs    05/29/23 0735 05/30/23 0404  WBC 8.6 9.2  HGB 7.6* 7.4*  HCT 23.9* 23.3*  MCV 86.3 86.9  PLT 267 289   No results for input(s): "LABPROT", "INR" in the last 72 hours.    Assessment/Plan: -Acute on chronic anemia with maroon-colored blood per stool. -Chronic diarrhea with recent worsening of symptoms.  Currently on colestipol.  Not sure if she is getting Creon supplements. -History of lower extremity DVT and renal artery stenosis.  Eliquis on hold.  Currently on heparin drip. -Acute on chronic kidney disease.  Followed by nephrology.     Recommendations ------------------------- -Patient did not finish her prep but claiming to have clear bowel movements.  Okay to proceed with EGD and colonoscopy today.  Denied any blood in the stool during  prep.  Risks (bleeding, infection, bowel perforation that could require surgery, sedation-related changes in cardiopulmonary systems), benefits (identification and possible treatment of source of symptoms, exclusion of certain causes of symptoms), and alternatives (watchful waiting, radiographic imaging studies, empiric medical treatment)  were explained to patient/family in detail and patient wishes to proceed.     Kathi Der MD, FACP 05/30/2023, 10:30 AM  Contact #  [phone]

## 2023-05-30 NOTE — Op Note (Signed)
 [facility] Patient Name: [name] Procedure Date : 05/30/2023 MRN: [id number] Attending MD: Kathi Der , MD, [id number] Date of Birth: [birth year] CSN: [id number] Age: 78 Admit Type: Inpatient Procedure:                Upper GI endoscopy Indications:              Iron deficiency anemia, Dysphagia, Recent                            gastrointestinal bleeding Providers:                Kathi Der, MD, Martha Clan, RN, Alan Ripper, Technician Referring MD:              Medicines:                Sedation Administered by an Anesthesia Professional Complications:            No immediate complications. Estimated Blood Loss:     Estimated blood loss was minimal. Procedure:                Pre-Anesthesia Assessment:                           - Prior to the procedure, a History and Physical                            was performed, and patient medications and                            allergies were reviewed. The patient's tolerance of                            previous anesthesia was also reviewed. The risks                            and benefits of the procedure and the sedation                            options and risks were discussed with the patient.                            All questions were answered, and informed consent                            was obtained. Prior Anticoagulants: The patient has                            taken Eliquis (apixaban), last dose was 3 days                            prior to procedure. ASA Grade Assessment: III - A  patient with severe systemic disease. After                            reviewing the risks and benefits, the patient was                            deemed in satisfactory condition to undergo the                            procedure.                           After obtaining informed consent, the endoscope was                            passed under  direct vision. Throughout the                            procedure, the patient's blood pressure, pulse, and                            oxygen saturations were monitored continuously. The                            [id number]-[id number] ([id number]) Olympus endoscope was introduced                            through the mouth, and advanced to the second part                            of duodenum. The upper GI endoscopy was                            accomplished without difficulty. The patient                            tolerated the procedure well. Scope In: Scope Out: Findings:      A non-obstructing Schatzki ring was found at the gastroesophageal       junction.      A small hiatal hernia was present.      Scattered mild inflammation characterized by congestion (edema) and       erythema was found in the entire examined stomach.      The cardia and gastric fundus were normal on retroflexion.      The duodenal bulb, first portion of the duodenum and second portion of       the duodenum were normal. Impression:               - Non-obstructing Schatzki ring.                           - Small hiatal hernia.                           - Chronic gastritis.                           -  Normal duodenal bulb, first portion of the                            duodenum and second portion of the duodenum.                           - No specimens collected. Recommendation:           - Perform a colonoscopy today. Procedure Code(s):        --- Professional ---                           [phone], Esophagogastroduodenoscopy, flexible,                            transoral; diagnostic, including collection of                            specimen(s) by brushing or washing, when performed                            (separate procedure) Diagnosis Code(s):        --- Professional ---                           K22.2, Esophageal obstruction                           K44.9, Diaphragmatic hernia without obstruction or                             gangrene                           K29.50, Unspecified chronic gastritis without                            bleeding                           D50.9, Iron deficiency anemia, unspecified                           R13.10, Dysphagia, unspecified                           K92.2, Gastrointestinal hemorrhage, unspecified CPT copyright 2022 American Medical Association. All rights reserved. The codes documented in this report are preliminary and upon coder review may  be revised to meet current compliance requirements. Kathi Der, MD Kathi Der, MD 05/30/2023 2:14:44 PM Number of Addenda: 0

## 2023-05-30 NOTE — Progress Notes (Signed)
 Dr. Servando Salina notified she has s/o of on this patient - please call if questions.  Please notify when patient is being discharged so we can arrange follow-up.

## 2023-05-30 NOTE — Plan of Care (Signed)
  Problem: Education: Goal: Ability to describe self-care measures that may prevent or decrease complications (Diabetes Survival Skills Education) will improve Outcome: Progressing   Problem: Coping: Goal: Ability to adjust to condition or change in health will improve Outcome: Progressing   Problem: Fluid Volume: Goal: Ability to maintain a balanced intake and output will improve Outcome: Progressing   Problem: Nutritional: Goal: Maintenance of adequate nutrition will improve Outcome: Progressing   Problem: Skin Integrity: Goal: Risk for impaired skin integrity will decrease Outcome: Progressing   Problem: Tissue Perfusion: Goal: Adequacy of tissue perfusion will improve Outcome: Progressing   Problem: Education: Goal: Knowledge of General Education information will improve Description: Including pain rating scale, medication(s)/side effects and non-pharmacologic comfort measures Outcome: Progressing   Problem: Health Behavior/Discharge Planning: Goal: Ability to manage health-related needs will improve Outcome: Progressing   Problem: Clinical Measurements: Goal: Ability to maintain clinical measurements within normal limits will improve Outcome: Progressing

## 2023-05-30 NOTE — Progress Notes (Signed)
 Mobility Specialist Progress Note;   05/30/23 0937  Mobility  Activity Ambulated with assistance in hallway  Level of Assistance Standby assist, set-up cues, supervision of patient - no hands on  Assistive Device Front wheel walker  Distance Ambulated (ft) 225 ft  Activity Response Tolerated well  Mobility Referral Yes  Mobility visit 1 Mobility  Mobility Specialist Start Time (ACUTE ONLY) [id number]  Mobility Specialist Stop Time (ACUTE ONLY) 0948  Mobility Specialist Time Calculation (min) (ACUTE ONLY) 11 min   Pt agreeable to mobility. Required no physical assistance during ambulation, SV. VSS throughout and no c/o when asked. Pt returned back to sitting on EOB with all needs met, waiting for procedure. Daughter in room.   [name] Mobility Specialist Please contact via SecureChat or [employer] [phone]

## 2023-05-31 DIAGNOSIS — R0602 Shortness of breath: Secondary | ICD-10-CM | POA: Diagnosis not present

## 2023-05-31 LAB — CBC
HCT: 23.1 % — ABNORMAL LOW (ref 36.0–46.0)
Hemoglobin: 7.1 g/dL — ABNORMAL LOW (ref 12.0–15.0)
MCH: 27.4 pg (ref 26.0–34.0)
MCHC: 30.7 g/dL (ref 30.0–36.0)
MCV: 89.2 fL (ref 80.0–100.0)
Platelets: 270 10*3/uL (ref 150–400)
RBC: 2.59 MIL/uL — ABNORMAL LOW (ref 3.87–5.11)
RDW: 18.8 % — ABNORMAL HIGH (ref 11.5–15.5)
WBC: 7.2 10*3/uL (ref 4.0–10.5)
nRBC: 0 % (ref 0.0–0.2)

## 2023-05-31 LAB — CALPROTECTIN, FECAL: Calprotectin, Fecal: 242 ug/g — ABNORMAL HIGH (ref 0–120)

## 2023-05-31 LAB — RENAL FUNCTION PANEL
Albumin: 2.1 g/dL — ABNORMAL LOW (ref 3.5–5.0)
Anion gap: 9 (ref 5–15)
BUN: 91 mg/dL — ABNORMAL HIGH (ref 8–23)
CO2: 21 mmol/L — ABNORMAL LOW (ref 22–32)
Calcium: 8.8 mg/dL — ABNORMAL LOW (ref 8.9–10.3)
Chloride: 110 mmol/L (ref 98–111)
Creatinine, Ser: 4.24 mg/dL — ABNORMAL HIGH (ref 0.44–1.00)
GFR, Estimated: 10 mL/min — ABNORMAL LOW (ref >60)
Glucose, Bld: 81 mg/dL (ref 70–99)
Phosphorus: 5.4 mg/dL — ABNORMAL HIGH (ref 2.5–4.6)
Potassium: 3.6 mmol/L (ref 3.5–5.1)
Sodium: 140 mmol/L (ref 135–145)

## 2023-05-31 LAB — SURGICAL PATHOLOGY

## 2023-05-31 NOTE — Progress Notes (Signed)
 [facility] NEPHROLOGY PROGRESS NOTE  Assessment/ Plan: Pt is a 78 y.o. yo female  with history of CKD 5 admitted with acute on chronic CHF, anemia.  # Progressive CKD 5 versus AKI on CKD,  Treated with IV diuretics and metolazone with improvement of the volume status.  However, the BUN and creatinine level elevated.  US kidney with chronicity but no hydronephrosis.  diuretics held last few days with stabilization of BUN and creatinine level.  No overt sign or symptoms of uremia to warrant urgent dialysis.  I have discussed with patient's daughter to review the lab results.  She is hesitant to put her in dialysis especially since this event is acute.  I will continue to hold diuretics today, as she was n.p.o. for endoscopy procedure this afternoon. Monitor urine output and labs. No urgent need for dialysis now however it might be coming very soon, family aware but not ready yet.  Numbers stable-  not overly uremic.  I have spoken with daughter-  if fails to thrive this could be our indication to start. will arrange close follow up at [name]-  appt on 06/18/23 at 3:20 with PA   # Metabolic acidosis:Continue sodium bicarbonate.  # CKD-MBD/hyperphosphatemia: Started sevelamer, monitor lab.  Check PTH level.  #Anemia of CKD: Concern about GI bleed, underwent endoscopy today with no sign of bleeding.  Back on Eliquis.    Iron saturation 35, received Aranesp on 3/12.  Transfuse as needed. Was getting either iron or aranesp as OP but has missed appts-  will need to resume after discharge  # Hypertension/volume: Close to euvolemia now.  BP elevated.  Resume low-dose of amlodipine.  Likely need to resume oral diuretics by tomorrow.  ok to send out on lasix 40  Subjective: Seen and examined at bedside. Denies nausea, vomiting, dysphagia, chest pain or shortness of breath.  Objective Vital signs in last 24 hours: Vitals:   05/30/23 1427 05/30/23 1707 05/30/23 1932 05/31/23 0521  BP: (!)  180/83 (!) 180/83 (!) 153/59   Pulse: 91  85   Resp: 17  18   Temp: 98.1 F (36.7 C)  98.7 F (37.1 C)   TempSrc: Oral  Oral   SpO2:   98%   Weight:    66.2 kg  Height:       Weight change: 0.013 kg  Intake/Output Summary (Last 24 hours) at 05/31/2023 1034 Last data filed at 05/30/2023 1828 Gross per 24 hour  Intake 440 ml  Output --  Net 440 ml       Labs: RENAL PANEL Recent Labs  Lab 05/25/23 2240 05/26/23 0616 05/26/23 1205 05/27/23 0516 05/28/23 0459 05/29/23 0735 05/30/23 0404 05/31/23 0501  NA  --   --    < > 140 137 140 140 140  K  --   --    < > 4.0 3.8 4.0 3.5 3.6  CL  --   --    < > 111 112* 107 108 110  CO2  --   --    < > 19* 18* 22 18* 21*  GLUCOSE  --   --    < > 93 87 92 79 81  BUN  --   --    < > 89* 100* 105* 93* 91*  CREATININE  --   --    < > 4.24* 4.65* 4.45* 4.21* 4.24*  CALCIUM  --   --    < > 8.5* 8.1* 8.6* 8.5* 8.8*  MG 0.9* 1.9  --   --   --   --   --   --   PHOS  --   --    < > 5.6* 6.3* 5.5* 5.4* 5.4*  ALBUMIN  --   --    < > 2.1* 2.4* 2.3* 2.2* 2.1*   < > = values in this interval not displayed.    Liver Function Tests: Recent Labs  Lab 05/25/23 1535 05/26/23 1205 05/29/23 0735 05/30/23 0404 05/31/23 0501  AST 14*  --   --   --   --   ALT 11  --   --   --   --   ALKPHOS 91  --   --   --   --   BILITOT 0.4  --   --   --   --   PROT 6.5  --   --   --   --   ALBUMIN 2.5*   < > 2.3* 2.2* 2.1*   < > = values in this interval not displayed.   No results for input(s): "LIPASE", "AMYLASE" in the last 168 hours. No results for input(s): "AMMONIA" in the last 168 hours. CBC: Recent Labs    01/03/23 1308 01/17/23 1238 01/31/23 1437 02/13/23 1332 02/28/23 1411 03/14/23 1450 04/04/23 1445 04/04/23 1446 04/18/23 1433 04/18/23 1434 05/08/23 1833 05/13/23 0358 05/13/23 0535 05/14/23 0802 05/27/23 0516 05/28/23 0459 05/29/23 0735 05/30/23 0404 05/31/23 0501  HGB 11.0*   < > 10.2*   < > 9.9*   < > 9.9*  --   --  9.4*    < >  --   --    < > 7.6* 7.9* 7.6* 7.4* 7.1*  MCV 88.7   < > 87.7   < > 85.2   < > 85.9  --   --  86.4   < >  --   --    < > 88.2 87.1 86.3 86.9 89.2  VITAMINB12 1,004*  --  1,098*  --  833  --  700  --   --  835  --  1,040*  --   --   --   --   --   --   --   FOLATE  --   --   --   --   --   --   --   --   --   --   --   --  5.3*  --   --   --   --   --   --   FERRITIN 61  --   --   --   --   --   --  153 226  --   --  220  --   --   --  246  --   --   --   TIBC  --   --   --   --   --   --   --   --   --   --   --  206*  --   --   --  207*  --   --   --   IRON  --   --   --   --   --   --   --   --   --   --   --  46  --   --   --  73  --   --   --  RETICCTPCT  --   --   --   --   --   --   --   --   --   --   --  1.5  --   --   --   --   --   --   --    < > = values in this interval not displayed.    Cardiac Enzymes: No results for input(s): "CKTOTAL", "CKMB", "CKMBINDEX", "TROPONINI" in the last 168 hours. CBG: No results for input(s): "GLUCAP" in the last 168 hours.  Iron Studies:  No results for input(s): "IRON", "TIBC", "TRANSFERRIN", "FERRITIN" in the last 72 hours.  Studies/Results: No results found.   Medications: Infusions:   Scheduled Medications:  amLODipine  5 mg Oral Daily   apixaban  2.5 mg Oral BID   colestipol  1 g Oral TID   feeding supplement  237 mL Oral BID BM   FLUoxetine  20 mg Oral Daily   folic acid  1 mg Oral Daily   lipase/protease/amylase  36,000 Units Oral TID AC   mirtazapine  7.5 mg Oral QHS   multivitamin  1 tablet Oral QHS   pantoprazole  40 mg Oral Daily   rivastigmine  1.5 mg Oral BID   sevelamer carbonate  800 mg Oral TID WC   sodium bicarbonate  650 mg Oral BID   sodium chloride flush  3 mL Intravenous Q12H   thiamine  100 mg Oral Daily    have reviewed scheduled and prn medications.  Physical Exam: General:NAD, comfortable Heart:RRR, s1s2 nl Lungs:clear b/l, no crackle Abdomen:soft, Non-tender, non-distended Extremities: min   edema Neurology: Alert awake and following commands.  Camiya Vinal A Anie Juniel 05/31/2023,10:34 AM  LOS: 6 days

## 2023-05-31 NOTE — Progress Notes (Signed)
 Mobility Specialist Progress Note;   05/31/23 1111  Mobility  Activity Ambulated with assistance in hallway  Level of Assistance Standby assist, set-up cues, supervision of patient - no hands on  Assistive Device Front wheel walker  Distance Ambulated (ft) 250 ft  Activity Response Tolerated well  Mobility Referral Yes  Mobility visit 1 Mobility  Mobility Specialist Start Time (ACUTE ONLY) 1111  Mobility Specialist Stop Time (ACUTE ONLY) 1118  Mobility Specialist Time Calculation (min) (ACUTE ONLY) 7 min   Pt agreeable to mobility. Required no physical assistance during ambulation, SV. VSS throughout and no c/o when asked. Pt returned back to chair with all needs met.   Caesar Bookman Mobility Specialist Please contact via SecureChat or [employer] [phone]

## 2023-05-31 NOTE — Care Management Important Message (Signed)
 Important Message  Patient Details  Name: [name] MRN: [id number] Date of Birth: [birth year]   Important Message Given:  Yes - Medicare IM     Renie Ora 05/31/2023, 10:53 AM

## 2023-05-31 NOTE — Plan of Care (Signed)

## 2023-05-31 NOTE — Progress Notes (Signed)
 Physical Therapy Treatment Patient Details Name: [name] MRN: [id number] DOB: 02-25-1946 Today's Date: 05/31/2023   History of Present Illness Patient is a 78 yo female presenting to the ED with weight gain of 11 pounds in 1 week on 05/25/23. Admitted with SoB and AKI. Patient with multiple admissions PMH:  CKD IV, HTN, CHF, CAD, DVT, CVA, DM2, gout, chronic gastritis, breast CA, dementia, depression.    PT Comments  Session focused on gait training to promote safe stair navigation and gait with rollator. Pt displayed improvement in global LE strength, activity tolerance, functional mobility, and ambulation as she has increased distance walked, ability to navigate stairs, and required less assistance for functional mobility and transfers. Pt reported her room is on the 1st floor and she doesn't have to climb the 12 stairs to the second floor everyday but does have 4 STE the home. Pt would benefit from continued acute PT and HHPT follow up to promote further gains in dynamic endurance, gait, and functional mobility. Will continue to follow acutely.      If plan is discharge home, recommend the following: Supervision due to cognitive status;Direct supervision/assist for financial management;Assistance with cooking/housework;Help with stairs or ramp for entrance;Direct supervision/assist for medications management;Assist for transportation;A little help with walking and/or transfers   Can travel by private vehicle        Equipment Recommendations  None recommended by PT    Recommendations for Other Services       Precautions / Restrictions Precautions Precautions: Fall Recall of Precautions/Restrictions: Intact Precaution/Restrictions Comments: memory impairment Restrictions Weight Bearing Restrictions Per Provider Order: No     Mobility  Bed Mobility               General bed mobility comments: pt in recliner upon arrival and returned to recliner EOS     Transfers Overall transfer level: Needs assistance Equipment used: Rollator (4 wheels) Transfers: Sit to/from Stand Sit to Stand: Supervision           General transfer comment: Pt stands with increased trunk flexion and requires multiple small power ups to fully stand    Ambulation/Gait Ambulation/Gait assistance: Supervision Gait Distance (Feet): 350 Feet Assistive device: Rollator (4 wheels) Gait Pattern/deviations: Step-through pattern, Shuffle, Trunk flexed, Drifts right/left, Trendelenburg Gait velocity: slightly decreased Gait velocity interpretation: >2.62 ft/sec, indicative of community ambulatory   General Gait Details: Pt tended to drift to her R when walking with rollator and reported the rollator was much faster than she used to. Pt required cueing for maintaining a closer proximity to rollator. Pt walks with L hip drop   Stairs Stairs: Yes Stairs assistance: Contact guard assist Stair Management: One rail Right, Step to pattern, Forwards, One rail Left Number of Stairs: 12 General stair comments: pt navigates stairs with step-to pattern, R LE leading up and L LE leading down. Displays L hip drop during R stance   Wheelchair Mobility     Tilt Bed    Modified Rankin (Stroke Patients Only)       Balance Overall balance assessment: Needs assistance Sitting-balance support: No upper extremity supported, Feet supported Sitting balance-Leahy Scale: Good Sitting balance - Comments: pt sits in recliner with no LOB or directional lean   Standing balance support: During functional activity, Reliant on assistive device for balance, Bilateral upper extremity supported, No upper extremity supported Standing balance-Leahy Scale: Fair Standing balance comment: pt stands with and without AD. Without AD, increased BOS. With AD increased trunk flexion. No LOB  Communication Communication Communication: No apparent difficulties   Cognition Arousal: Alert Behavior During Therapy: WFL for tasks assessed/performed   PT - Cognitive impairments: History of cognitive impairments, Memory, Attention                       PT - Cognition Comments: per daughter, the pt has a hx of dementia. Follows one step commands consistently. Requires additional time for more complex commands/instruction Following commands: Intact      Cueing Cueing Techniques: Verbal cues  Exercises      General Comments General comments (skin integrity, edema, etc.): VSS throughout session on RA      Pertinent Vitals/Pain Pain Assessment Pain Assessment: No/denies pain    Home Living                          Prior Function            PT Goals (current goals can now be found in the care plan section) Acute Rehab PT Goals Patient Stated Goal: return home PT Goal Formulation: With patient Time For Goal Achievement: 06/09/23 Potential to Achieve Goals: Good Progress towards PT goals: Progressing toward goals    Frequency    Min 2X/week      PT Plan      Co-evaluation              AM-PAC PT "6 Clicks" Mobility   Outcome Measure  Help needed turning from your back to your side while in a flat bed without using bedrails?: A Little Help needed moving from lying on your back to sitting on the side of a flat bed without using bedrails?: A Little Help needed moving to and from a bed to a chair (including a wheelchair)?: A Little Help needed standing up from a chair using your arms (e.g., wheelchair or bedside chair)?: A Little Help needed to walk in hospital room?: A Little Help needed climbing 3-5 steps with a railing? : A Little 6 Click Score: 18    End of Session Equipment Utilized During Treatment: Gait belt Activity Tolerance: Patient tolerated treatment well Patient left: in chair;with call bell/phone within reach;with chair alarm set Nurse Communication: Mobility status;Other (comment) (chair  alarm's battery were low) PT Visit Diagnosis: Muscle weakness (generalized) (M62.81);Difficulty in walking, not elsewhere classified (R26.2);Other abnormalities of gait and mobility (R26.89)     Time: 1610-9604 PT Time Calculation (min) (ACUTE ONLY): 18 min  Charges:    $Gait Training: 8-22 mins PT General Charges $$ ACUTE PT VISIT: 1 Visit                     [address], SPT    [place] 05/31/2023, 6:32 PM

## 2023-05-31 NOTE — Progress Notes (Signed)
 [facility] Progress Note  [name] 78 y.o. 07/04/1945  CC: Anemia, diarrhea   Subjective: Patient seen and examined at bedside.  Sitting in the recliner.  Complaining of right lower back pain which started this morning when she woke up.  Denies any further diarrhea.  Denies any further bleeding.  Denies abdominal pain, nausea or vomiting.  ROS : Afebrile, negative for chest pain   Objective: Vital signs in last 24 hours: Vitals:   05/30/23 1707 05/30/23 1932  BP: (!) 180/83 (!) 153/59  Pulse:  85  Resp:  18  Temp:  98.7 F (37.1 C)  SpO2:  98%    Physical Exam:  General:   Alert,  Well-developed, well-nourished, pleasant and cooperative in NAD Lungs: No visible respiratory distress Heart:  Regular rate and rhythm; no murmurs, clicks, rubs,  or gallops. Abdomen: Soft, nontender, nondistended, bowel sound present, no peritoneal signs Lower extremity edema noted Mood and affect normal Alert and oriented x 3   Lab Results: Recent Labs    05/30/23 0404 05/31/23 0501  NA 140 140  K 3.5 3.6  CL 108 110  CO2 18* 21*  GLUCOSE 79 81  BUN 93* 91*  CREATININE 4.21* 4.24*  CALCIUM 8.5* 8.8*  PHOS 5.4* 5.4*   Recent Labs    05/30/23 0404 05/31/23 0501  ALBUMIN 2.2* 2.1*   Recent Labs    05/30/23 0404 05/31/23 0501  WBC 9.2 7.2  HGB 7.4* 7.1*  HCT 23.3* 23.1*  MCV 86.9 89.2  PLT 289 270   No results for input(s): "LABPROT", "INR" in the last 72 hours.    Assessment/Plan: -Acute on chronic anemia with maroon-colored blood per stool. -Chronic diarrhea with recent worsening of symptoms.  Currently on colestipol.  Not sure if she is getting Creon supplements. -History of lower extremity DVT and renal artery stenosis.  Was on Eliquis.  Was subsequently put on heparin drip. -Acute on chronic kidney disease.  Followed by nephrology.    -EGD showed nonobstructive lower esophageal ring, hiatal hernia and gastritis. -Colonoscopy showed fair  prep, ileocolonic anastomosis in the right colon, small polyp which was removed with cold snare but not retrieved.  Normal mucosa.  Biopsies taken for colitis.  No evidence of active bleeding.  Brown stool in the colon.   Recommendations -------------------------  -Patient denies any acute GI issues at night.  No further diarrhea.  No further bleeding.  Abdominal exam benign today -Restarted on Eliquis -No further inpatient GI workup planned.  Recommend to continue colestipol 1 g 3 times a day, Creon 2 capsules with meals and 1 capsule with snacks and Imodium as needed. -GI will sign off.  Call us back if needed.    Kathi Der MD, FACP 05/31/2023, 9:08 AM  Contact #  [phone]

## 2023-05-31 NOTE — Progress Notes (Signed)
 PROGRESS NOTE [name]  UJW:[id number] DOB: November 09, 1945 DOA: 05/25/2023 PCP: Renford Dills, MD  Brief Narrative/Hospital Course: 78 year old female with history of HTN, CAD, CKD stage IV lower extremity DVT/renal artery thrombosis on Eliquis, chronic HFpEF, depression/memory loss, chronic diarrhea following up with GI presented to the ED with 11 pound weight gain in [redacted] week along with lower leg edema and shortness of breath despite taking extra Lasix dose. In the ED-hemodynamically stable, on room air afebrile- felt to have volume overload BNP 336 troponin 19, hemoglobin 7.9- chest x-ray without acute cardiopulmonary finding, patient was given Lasix, nephrology consulted and admitted for further management.  Significant imaging/procedures: 3/13>EGD: nonobstructive lower esophageal ring, hiatal hernia and gastritis. Colonoscopy: fair prep, ileocolonic anastomosis in the right colon, small polyp which was removed with cold snare but not retrieved.  Normal mucosa.  Biopsies taken for colitis.  No evidence of active bleeding.  Brown stool in the colon> GI recommending no further workup, okay to resume Eliquis 3/14, and continue colestipol, Creon and signed off.  Consultation: Nephrology Gastroenterology Cardiology     Subjective: Seen and examined this morning.  She is resting comfortably BP on higher side, afebrile, saturating on room air Labs with hemoglobin dropped to 7.1 BUN/creat 91/4.2 K3.6 but bicarb 21. GI and nephrology has seen the patient no further recommendation.   Assessment and Plan: Principal Problem:   SOB (shortness of breath) Active Problems:   Acute kidney injury superimposed on chronic kidney disease (HCC)   Acute on chronic heart failure with preserved ejection fraction (HFpEF) (HCC)   Benign essential HTN   CAD (coronary artery disease)   Renal vein thrombosis (HCC)   Type 2 diabetes mellitus (HCC)   Anemia   Progressive CKD 5 versus AKI on CKD : Metabolic  acidosis Fluid overload: Patient received IV diuretics and metolazone with improvement in fluid status BN/creatinine remains elevated ultrasound kidneys showed current CT no hydronephrosis antibiotics remains on hold currently, monitor overall improving, BUN/creatinine has been overall stable.  Nephrology at this time recommends no immediate need for dialysis but may need 1 soon.  Monitor renal function closely as outpatient   Acute on chronic CHF with preserved EF-EF on Lasix 40 daily Demand ischemia: BNP 336 on admission with troponin 19> 23-indicating demand ischemia from CHF. Fluid status improved with diuretics currently on hold.Repeat echo -EF 60-65%, G1 DD normal RV systolic function. Cardiology signed off   Acute on chronic anemia with maroon-colored blood per stool Chronic diarrhea with recent worsening of symptoms Anemia from chronic kidney disease and folate deficiency anemia r/o GI bleeding: Hb baseline around 8-10 g in last few months. Last anemia panel from 05/09/2023 with normal B12 and folate deficiency-folate added., on aranesp.Marland Kitchen Hemoccult test negative but due to ongoing anemia GI consulted s/p EGD/colonoscopy  3/13-no acute significant finding, Eliquis has been resumed as per GI continue address anemia which is likely from multifactorial and anemia of renal disease and will need outpatient iron/iron as an close follow-up transfuse if less than 7 g.  Will monitor hemoglobin 1 more night to make sure remains stable for discharge   History of renal thrombosis History of DVT: Eliquis resumed as per GI 3/13   CAD s/p remote PCI to LAD in 2000 Hypertension HLD: Doing well no chest pain BP borderline controlled, continue amlodipine 5, and follow-up with nephrology to adjust meds as needed  Cardiology signed off.   Diabetes mellitus: Last A1c 6.2 05/08/2023. PTA not on meds.  Monitor intermittently.  Blood  sugar stable  Hypomagnesemia: Resolved   Depression Memory  loss: Patient followed by neuropsychiatry team has appointment coming [facility].  Continue her rivastigmine, folic acid, Prozac thiamine and remeron.   Moderate malnutrition: Augment diet as tolerated. Nutrition Problem: Moderate Malnutrition Etiology: acute illness Signs/Symptoms: mild fat depletion, mild muscle depletion, energy intake < 75% for > 7 days Interventions: Refer to RD note for recommendations  DVT prophylaxis: apixaban (ELIQUIS) tablet 2.5 mg Start: 05/30/23 2200 Code Status:   Code Status: Full Code Family Communication: plan of care discussed with patient at bedside. Patient status is: Remains hospitalized because of severity of illness Level of care: Telemetry Cardiac   Dispo: The patient is from: home            Anticipated disposition: Anticipate discharge tomorrow if hemoglobin stable   Objective: Vitals last 24 hrs: Vitals:   05/30/23 1427 05/30/23 1707 05/30/23 1932 05/31/23 0521  BP: (!) 180/83 (!) 180/83 (!) 153/59   Pulse: 91  85   Resp: 17  18   Temp: 98.1 F (36.7 C)  98.7 F (37.1 C)   TempSrc: Oral  Oral   SpO2:   98%   Weight:    66.2 kg  Height:       Weight change: 0.013 kg  Physical Examination: General exam: alert awake,at baseline, older than stated age HEENT:Oral mucosa moist, Ear/Nose WNL grossly Respiratory system: Bilaterally clear BS,no use of accessory muscle Cardiovascular system: S1 & S2 +, No JVD. Gastrointestinal system: Abdomen soft,NT,ND, BS+ Nervous System: Alert, awake, moving all extremities,and following commands. Extremities: LE edema mild ,distal peripheral pulses palpable and warm.  Skin: No rashes,no icterus. MSK: Normal muscle bulk,tone, power   Medications reviewed:  Scheduled Meds:  amLODipine  5 mg Oral Daily   apixaban  2.5 mg Oral BID   colestipol  1 g Oral TID   feeding supplement  237 mL Oral BID BM   FLUoxetine  20 mg Oral Daily   folic acid  1 mg Oral Daily   lipase/protease/amylase  36,000  Units Oral TID AC   mirtazapine  7.5 mg Oral QHS   multivitamin  1 tablet Oral QHS   pantoprazole  40 mg Oral Daily   rivastigmine  1.5 mg Oral BID   sevelamer carbonate  800 mg Oral TID WC   sodium bicarbonate  650 mg Oral BID   sodium chloride flush  3 mL Intravenous Q12H   thiamine  100 mg Oral Daily   Continuous Infusions:    Diet Order             Diet renal with fluid restriction Fluid restriction: 1200 mL Fluid; Room service appropriate? Yes; Fluid consistency: Thin  Diet effective now                   Intake/Output Summary (Last 24 hours) at 05/31/2023 1409 Last data filed at 05/30/2023 1828 Gross per 24 hour  Intake 240 ml  Output --  Net 240 ml   Net IO Since Admission: -2,228.17 mL [05/31/23 1409]  Wt Readings from Last 3 Encounters:  05/31/23 66.2 kg  05/17/23 71.1 kg  05/14/23 71.1 kg     Unresulted Labs (From admission, onward)     Start     Ordered   05/30/23 0500  Renal function panel  Daily,   R     Question:  Specimen collection method  Answer:  Lab=Lab collect   05/29/23 1024  Data Reviewed: I have personally reviewed following labs and imaging studies CBC: Recent Labs  Lab 05/27/23 0516 05/28/23 0459 05/29/23 0735 05/30/23 0404 05/31/23 0501  WBC 8.6 9.2 8.6 9.2 7.2  HGB 7.6* 7.9* 7.6* 7.4* 7.1*  HCT 24.6* 24.9* 23.9* 23.3* 23.1*  MCV 88.2 87.1 86.3 86.9 89.2  PLT 263 282 267 289 270   Basic Metabolic Panel:  Recent Labs  Lab 05/25/23 2240 05/26/23 0616 05/26/23 1205 05/27/23 0516 05/28/23 0459 05/29/23 0735 05/30/23 0404 05/31/23 0501  NA  --   --    < > 140 137 140 140 140  K  --   --    < > 4.0 3.8 4.0 3.5 3.6  CL  --   --    < > 111 112* 107 108 110  CO2  --   --    < > 19* 18* 22 18* 21*  GLUCOSE  --   --    < > 93 87 92 79 81  BUN  --   --    < > 89* 100* 105* 93* 91*  CREATININE  --   --    < > 4.24* 4.65* 4.45* 4.21* 4.24*  CALCIUM  --   --    < > 8.5* 8.1* 8.6* 8.5* 8.8*  MG 0.9* 1.9  --   --   --    --   --   --   PHOS  --   --    < > 5.6* 6.3* 5.5* 5.4* 5.4*   < > = values in this interval not displayed.   GFR: Estimated Creatinine Clearance: 10.7 mL/min (A) (by C-G formula based on SCr of 4.24 mg/dL (H)). Liver Function Tests:  Recent Labs  Lab 05/25/23 1535 05/26/23 1205 05/27/23 0516 05/28/23 0459 05/29/23 0735 05/30/23 0404 05/31/23 0501  AST 14*  --   --   --   --   --   --   ALT 11  --   --   --   --   --   --   ALKPHOS 91  --   --   --   --   --   --   BILITOT 0.4  --   --   --   --   --   --   PROT 6.5  --   --   --   --   --   --   ALBUMIN 2.5*   < > 2.1* 2.4* 2.3* 2.2* 2.1*   < > = values in this interval not displayed.  Sepsis Labs: No results for input(s): "PROCALCITON", "LATICACIDVEN" in the last 168 hours. Recent Results (from the past 240 hours)  Calprotectin, Fecal     Status: Abnormal   Collection Time: 05/27/23  6:43 PM   Specimen: STOOL  Result Value Ref Range Status   Calprotectin, Fecal 242 (H) 0 - 120 ug/g Final    Comment: (NOTE) Concentration     Interpretation   Follow-Up < 5 - 50 ug/g     Normal           None >50 -120 ug/g     Borderline       Re-evaluate in 4-6 weeks    >120 ug/g     Abnormal         Repeat as clinically  indicated Performed At: [facility] [address] [place], [place] [id number] Jolene Schimke MD KG:[id number]   Antimicrobials/Microbiology: Anti-infectives (From admission, onward)    None         Component Value Date/Time   SDES BLOOD BLOOD LEFT ARM 07/29/2022 0236   SPECREQUEST  07/29/2022 0236    BOTTLES DRAWN AEROBIC AND ANAEROBIC Blood Culture adequate volume   CULT  07/29/2022 0236    NO GROWTH 5 DAYS Performed at [facility], [address], [place], [place] [address]    REPTSTATUS 08/03/2022 FINAL 07/29/2022 0236   Radiology Studies: No results found.   LOS: 6 days   Total time spent in review of labs and imaging, patient evaluation, formulation  of plan, documentation and communication with family: 35 minutes  Lanae Boast, MD  [facility]  05/31/2023, 2:09 PM

## 2023-06-01 ENCOUNTER — Other Ambulatory Visit (HOSPITAL_COMMUNITY): Payer: Self-pay

## 2023-06-01 ENCOUNTER — Encounter: Payer: Self-pay | Admitting: Hematology

## 2023-06-01 DIAGNOSIS — R0602 Shortness of breath: Secondary | ICD-10-CM | POA: Diagnosis not present

## 2023-06-01 LAB — RENAL FUNCTION PANEL
Albumin: 2.1 g/dL — ABNORMAL LOW (ref 3.5–5.0)
Anion gap: 11 (ref 5–15)
BUN: 92 mg/dL — ABNORMAL HIGH (ref 8–23)
CO2: 19 mmol/L — ABNORMAL LOW (ref 22–32)
Calcium: 8.5 mg/dL — ABNORMAL LOW (ref 8.9–10.3)
Chloride: 109 mmol/L (ref 98–111)
Creatinine, Ser: 5.06 mg/dL — ABNORMAL HIGH (ref 0.44–1.00)
GFR, Estimated: 8 mL/min — ABNORMAL LOW (ref >60)
Glucose, Bld: 96 mg/dL (ref 70–99)
Phosphorus: 5 mg/dL — ABNORMAL HIGH (ref 2.5–4.6)
Potassium: 3.8 mmol/L (ref 3.5–5.1)
Sodium: 139 mmol/L (ref 135–145)

## 2023-06-01 LAB — CBC
HCT: 25.4 % — ABNORMAL LOW (ref 36.0–46.0)
Hemoglobin: 7.7 g/dL — ABNORMAL LOW (ref 12.0–15.0)
MCH: 27.4 pg (ref 26.0–34.0)
MCHC: 30.3 g/dL (ref 30.0–36.0)
MCV: 90.4 fL (ref 80.0–100.0)
Platelets: 328 10*3/uL (ref 150–400)
RBC: 2.81 MIL/uL — ABNORMAL LOW (ref 3.87–5.11)
RDW: 18.6 % — ABNORMAL HIGH (ref 11.5–15.5)
WBC: 9.1 10*3/uL (ref 4.0–10.5)
nRBC: 0 % (ref 0.0–0.2)

## 2023-06-01 MED ORDER — SEVELAMER CARBONATE 800 MG PO TABS
800.0000 mg | ORAL_TABLET | Freq: Three times a day (TID) | ORAL | 0 refills | Status: DC
  Filled 2023-06-01: qty 90, 30d supply, fill #0

## 2023-06-01 MED ORDER — SODIUM BICARBONATE 650 MG PO TABS
650.0000 mg | ORAL_TABLET | Freq: Two times a day (BID) | ORAL | 0 refills | Status: DC
  Filled 2023-06-01: qty 60, 30d supply, fill #0

## 2023-06-01 NOTE — Progress Notes (Signed)
 [facility] NEPHROLOGY PROGRESS NOTE  Assessment/ Plan: Pt is a 78 y.o. yo female  with history of CKD 5 admitted with acute on chronic CHF, anemia.  # Progressive CKD 5 versus AKI on CKD,  Treated with IV diuretics and metolazone with improvement of the volume status.  However, the BUN and creatinine level elevated.  US kidney with chronicity but no hydronephrosis.  diuretics held last few days with stabilization of BUN and creatinine level.  No overt sign or symptoms of uremia to warrant urgent dialysis.  I have discussed with patient's daughter to review the lab results.  She is hesitant to put her in dialysis especially since this event is acute.  I will continue to hold diuretics today, as she was n.p.o. for endoscopy procedure this afternoon. Monitor urine output and labs. No urgent need for dialysis now however it might be coming very soon, family aware but not ready yet.  Numbers stable-  not overly uremic.  I have spoken with daughter-  if fails to thrive this could be our indication to start. will arrange close follow up at [name]-  appt on 06/18/23 at 3:20 with PA -  will order bmp next week and set her up with ESA injections thru our office  # Metabolic acidosis:Continue sodium bicarbonate.  # CKD-MBD/hyperphosphatemia: Started sevelamer, monitor lab.   #Anemia of CKD: Concern about GI bleed, underwent endoscopy today with no sign of bleeding.  Back on Eliquis.    Iron saturation 35, received Aranesp on 3/12.  Transfuse as needed. Was getting either iron or aranesp as OP but has missed appts-  will need to resume after discharge  # Hypertension/volume: Close to euvolemia now.  BP elevated.  Resume low-dose of amlodipine.  Likely need to resume oral diuretics by tomorrow.  ok to send out on lasix 40  Subjective: Seen and examined at bedside. Denies nausea, vomiting, dysphagia, chest pain or shortness of breath.  Objective Vital signs in last 24 hours: Vitals:   05/30/23  1932 05/31/23 0521 05/31/23 2010 06/01/23 0549  BP: (!) 153/59  (!) 151/64 (!) 172/70  Pulse: 85  88 86  Resp: 18   19  Temp: 98.7 F (37.1 C)  98 F (36.7 C) 98 F (36.7 C)  TempSrc: Oral  Oral Oral  SpO2: 98%  100% 98%  Weight:  66.2 kg  67.1 kg  Height:       Weight change: -0.013 kg No intake or output data in the 24 hours ending 06/01/23 1007      Labs: RENAL PANEL Recent Labs  Lab 05/25/23 2240 05/26/23 0616 05/26/23 1205 05/28/23 0459 05/29/23 0735 05/30/23 0404 05/31/23 0501 06/01/23 0500  NA  --   --    < > 137 140 140 140 139  K  --   --    < > 3.8 4.0 3.5 3.6 3.8  CL  --   --    < > 112* 107 108 110 109  CO2  --   --    < > 18* 22 18* 21* 19*  GLUCOSE  --   --    < > 87 92 79 81 96  BUN  --   --    < > 100* 105* 93* 91* 92*  CREATININE  --   --    < > 4.65* 4.45* 4.21* 4.24* 5.06*  CALCIUM  --   --    < > 8.1* 8.6* 8.5* 8.8* 8.5*  MG 0.9*  1.9  --   --   --   --   --   --   PHOS  --   --    < > 6.3* 5.5* 5.4* 5.4* 5.0*  ALBUMIN  --   --    < > 2.4* 2.3* 2.2* 2.1* 2.1*   < > = values in this interval not displayed.    Liver Function Tests: Recent Labs  Lab 05/25/23 1535 05/26/23 1205 05/30/23 0404 05/31/23 0501 06/01/23 0500  AST 14*  --   --   --   --   ALT 11  --   --   --   --   ALKPHOS 91  --   --   --   --   BILITOT 0.4  --   --   --   --   PROT 6.5  --   --   --   --   ALBUMIN 2.5*   < > 2.2* 2.1* 2.1*   < > = values in this interval not displayed.   No results for input(s): "LIPASE", "AMYLASE" in the last 168 hours. No results for input(s): "AMMONIA" in the last 168 hours. CBC: Recent Labs    01/03/23 1308 01/17/23 1238 01/31/23 1437 02/13/23 1332 02/28/23 1411 03/14/23 1450 04/04/23 1445 04/04/23 1446 04/18/23 1433 04/18/23 1434 05/08/23 1833 05/13/23 0358 05/13/23 0535 05/14/23 0802 05/28/23 0459 05/29/23 0735 05/30/23 0404 05/31/23 0501 06/01/23 0855  HGB 11.0*   < > 10.2*   < > 9.9*   < > 9.9*  --   --  9.4*   <  >  --   --    < > 7.9* 7.6* 7.4* 7.1* 7.7*  MCV 88.7   < > 87.7   < > 85.2   < > 85.9  --   --  86.4   < >  --   --    < > 87.1 86.3 86.9 89.2 90.4  VITAMINB12 1,004*  --  1,098*  --  833  --  700  --   --  835  --  1,040*  --   --   --   --   --   --   --   FOLATE  --   --   --   --   --   --   --   --   --   --   --   --  5.3*  --   --   --   --   --   --   FERRITIN 61  --   --   --   --   --   --  153 226  --   --  220  --   --  246  --   --   --   --   TIBC  --   --   --   --   --   --   --   --   --   --   --  206*  --   --  207*  --   --   --   --   IRON  --   --   --   --   --   --   --   --   --   --   --  46  --   --  73  --   --   --   --  RETICCTPCT  --   --   --   --   --   --   --   --   --   --   --  1.5  --   --   --   --   --   --   --    < > = values in this interval not displayed.    Cardiac Enzymes: No results for input(s): "CKTOTAL", "CKMB", "CKMBINDEX", "TROPONINI" in the last 168 hours. CBG: No results for input(s): "GLUCAP" in the last 168 hours.  Iron Studies:  No results for input(s): "IRON", "TIBC", "TRANSFERRIN", "FERRITIN" in the last 72 hours.  Studies/Results: No results found.   Medications: Infusions:   Scheduled Medications:  amLODipine  5 mg Oral Daily   apixaban  2.5 mg Oral BID   colestipol  1 g Oral TID   feeding supplement  237 mL Oral BID BM   FLUoxetine  20 mg Oral Daily   folic acid  1 mg Oral Daily   lipase/protease/amylase  36,000 Units Oral TID AC   mirtazapine  7.5 mg Oral QHS   multivitamin  1 tablet Oral QHS   pantoprazole  40 mg Oral Daily   rivastigmine  1.5 mg Oral BID   sevelamer carbonate  800 mg Oral TID WC   sodium bicarbonate  650 mg Oral BID   sodium chloride flush  3 mL Intravenous Q12H    have reviewed scheduled and prn medications.  Physical Exam: General:NAD, comfortable Heart:RRR, s1s2 nl Lungs:clear b/l, no crackle Abdomen:soft, Non-tender, non-distended Extremities: min  edema Neurology: Alert awake and  following commands.  Ashunti Schofield A Azaiah Mello 06/01/2023,10:07 AM  LOS: 7 days

## 2023-06-01 NOTE — Plan of Care (Signed)

## 2023-06-01 NOTE — TOC Transition Note (Signed)
 Transition of Care [facility]) - Discharge Note   Patient Details  Name: [name] MRN: [id number] Date of Birth: 02-Apr-1945  Transition of [facility] CM/SW Contact:  Ronny Bacon, RN Phone Number: 06/01/2023, 11:16 AM   Clinical Narrative:   Patient is being discharged today. Katina with [facility] made aware.    Final next level of care: Home w Home Health Services Barriers to Discharge: No Barriers Identified   Patient Goals and CMS Choice Patient states their goals for this hospitalization and ongoing recovery are:: per patient's daughters, Porfirio Mylar and Fredonia Highland to return home with resumption of home health services.          Discharge Placement                       Discharge Plan and Services Additional resources added to the After Visit Summary for     Discharge Planning Services: CM Consult Post Acute Care Choice: Home Health                    HH Arranged: PT, Nurse's Aide, Social Work, OT [facility] Agency:  [employer] [facility] phone [phone] POC: Clifton Custard) Date [facility] Agency Contacted: 05/27/23 Time HH Agency Contacted: [phone] Representative spoke with at [facility] Agency: Clifton Custard  Social Drivers of Health (SDOH) Interventions SDOH Screenings   Food Insecurity: No Food Insecurity (05/25/2023)  Housing: Low Risk  (05/25/2023)  Transportation Needs: No Transportation Needs (05/25/2023)  Utilities: Not At Risk (05/25/2023)  Alcohol Screen: Low Risk  (10/25/2021)  Depression (PHQ2-9): Low Risk  (09/12/2020)  Financial Resource Strain: Low Risk  (10/25/2021)  Social Connections: Socially Isolated (05/25/2023)  Tobacco Use: Low Risk  (05/25/2023)     Readmission Risk Interventions    05/13/2023    4:50 PM 12/27/2021    2:10 PM 11/08/2021   12:56 PM  Readmission Risk Prevention Plan  Transportation Screening Complete Complete Complete  Medication Review (RN Care Manager) Referral to Pharmacy Complete Complete  PCP or Specialist appointment within 3-5 days of discharge  Complete Complete Complete  HRI or Home Care Consult Complete Complete   SW Recovery Care/Counseling Consult Complete Complete Complete  Palliative Care Screening Not Applicable  Not Applicable  Skilled Nursing Facility Not Applicable Not Applicable Not Applicable

## 2023-06-01 NOTE — Discharge Summary (Signed)
 Physician Discharge Summary  [name] QQV:[id number] DOB: 20-Apr-1945 DOA: 05/25/2023  PCP: Renford Dills, MD  Admit date: 05/25/2023 Discharge date: 06/01/2023 Recommendations for Outpatient Follow-up:  Follow up with PCP in 1 weeks-call for appointment Follow-up with nephrology follow-up Please obtain BMP/CBC in one week  Discharge Dispo: home Discharge Condition: Stable Code Status:   Code Status: Full Code Diet recommendation:  Diet Order             Diet renal with fluid restriction Fluid restriction: 1200 mL Fluid; Room service appropriate? Yes; Fluid consistency: Thin  Diet effective now                    Brief/Interim Summary: 78 year old female with history of HTN, CAD, CKD stage IV lower extremity DVT/renal artery thrombosis on Eliquis, chronic HFpEF, depression/memory loss, chronic diarrhea following up with GI presented to the ED with 11 pound weight gain in [redacted] week along with lower leg edema and shortness of breath despite taking extra Lasix dose. In the ED-hemodynamically stable, on room air afebrile- felt to have volume overload BNP 336 troponin 19, hemoglobin 7.9- chest x-ray without acute cardiopulmonary finding, patient was given Lasix, nephrology consulted and admitted for further management.  Patient was diuresed by nephrology with improvement in fluid overload. Seen by GI due to anemia concern for GI bleeding acute on chronic diarrhea EGD colonoscopy no acute finding okay to resume on Eliquis.  Patient has anemia of CKD iron saturation 30% Aranesp, will need outpatient follow-up with nephrology for iron has/iron-has missed previous appointments-nephrology or hemonc will arrange. Discussion approved this morning and okay for discharge home   Significant imaging/procedures: 3/13>EGD: nonobstructive lower esophageal ring, hiatal hernia and gastritis. Colonoscopy: fair prep, ileocolonic anastomosis in the right colon, small polyp which was removed with cold  snare but not retrieved.  Normal mucosa.  Biopsies taken for colitis.  No evidence of active bleeding.  Brown stool in the colon> GI recommending no further workup, okay to resume Eliquis 3/14, and continue colestipol, Creon and signed off.  Consultation: Nephrology Gastroenterology Cardiology    Discharge Diagnoses:  Principal Problem:   SOB (shortness of breath) Active Problems:   Acute kidney injury superimposed on chronic kidney disease (HCC)   Acute on chronic heart failure with preserved ejection fraction (HFpEF) (HCC)   Benign essential HTN   CAD (coronary artery disease)   Renal vein thrombosis (HCC)   Type 2 diabetes mellitus (HCC)   Anemia  Progressive CKD 5 versus AKI on CKD : Metabolic acidosis Fluid overload: Patient received IV diuretics and metolazone with improvement in fluid status BN/creatinine remains elevated ultrasound kidneys showed current CT no hydronephrosis antibiotics remains on hold currently, monitor overall improving, BUN/creatinine has been overall stable.  Nephrology at this time recommends no immediate need for dialysis but may need one soon.  Monitor renal function closely as outpatient.  Patient will resume Lasix upon discharge at 40 mg.   Acute on chronic CHF with preserved EF-EF on Lasix 40 daily Demand ischemia: BNP 336 on admission with troponin 19> 23-indicating demand ischemia from CHF. Fluid status improved with diuretics currently on hold.Repeat echo -EF 60-65%, G1 DD normal RV systolic function. Cardiology signed off   Acute on chronic anemia with maroon-colored blood per stool Chronic diarrhea with recent worsening of symptoms Anemia from chronic kidney disease and folate deficiency anemia and iron deficiency with iron saturation 45.  No GI bleeding: Hb baseline around 8-10 g in last few months. Last anemia  panel from 05/09/2023 with normal B12 and folate deficiency-folate added., on aranesp.Marland Kitchen Hemoccult test negative but due to ongoing  anemia GI consulted s/p EGD/colonoscopy  3/13-no acute significant finding, Eliquis has been resumed as per GI continue address anemia which is likely from multifactorial and anemia of renal disease and will need outpatient iron/iron as an close follow-up transfuse if less than 7 g.  Repeat hemoglobin 7.7 g   History of renal thrombosis History of DVT: Eliquis resumed as per GI 3/13   CAD s/p remote PCI to LAD in 2000 Hypertension HLD: Doing well no chest pain BP borderline controlled, continue amlodipine home dose- and follow-up with nephrology to adjust meds as needed  Cardiology signed off.   Diabetes mellitus: Last A1c 6.2 05/08/2023. PTA not on meds.  Monitor intermittently.  Blood sugar stable  Hypomagnesemia: Resolved   Depression Memory loss: Patient followed by neuropsychiatry team has appointment coming at [facility].  Continue her rivastigmine, folic acid, Prozac thiamine and remeron.   Moderate malnutrition: Augment diet as tolerated. Nutrition Problem: Moderate Malnutrition Etiology: acute illness Signs/Symptoms: mild fat depletion, mild muscle depletion, energy intake < 75% for > 7 days Interventions: Refer to RD note for recommendations  Subjective: Alert and oriented.  Minimal leg edema no shortness of breath on room air.  Eager to go home.  Family at the bedside  Discharge Exam: Vitals:   05/31/23 2010 06/01/23 0549  BP: (!) 151/64 (!) 172/70  Pulse: 88 86  Resp:  19  Temp: 98 F (36.7 C) 98 F (36.7 C)  SpO2: 100% 98%   General: Pt is alert, awake, not in acute distress Cardiovascular: RRR, S1/S2 +, no rubs, no gallops Respiratory: CTA bilaterally, no wheezing, no rhonchi Abdominal: Soft, NT, ND, bowel sounds + Extremities: no edema, no cyanosis  Discharge Instructions  Discharge Instructions     Discharge instructions   Complete by: As directed    Please call call MD or return to ER for similar or worsening recurring problem that brought you  to hospital or if any fever,nausea/vomiting,abdominal pain, uncontrolled pain, chest pain,  shortness of breath or any other alarming symptoms.  Please follow-up your doctor as instructed in a week time and call the office for appointment.  Please avoid alcohol, smoking, or any other illicit substance and maintain healthy habits including taking your regular medications as prescribed.  You were cared for by a hospitalist during your hospital stay. If you have any questions about your discharge medications or the care you received while you were in the hospital after you are discharged, you can call the unit and ask to speak with the hospitalist on call if the hospitalist that took care of you is not available.  Once you are discharged, your primary care physician will handle any further medical issues. Please note that NO REFILLS for any discharge medications will be authorized once you are discharged, as it is imperative that you return to your primary care physician (or establish a relationship with a primary care physician if you do not have one) for your aftercare needs so that they can reassess your need for medications and monitor your lab values   Increase activity slowly   Complete by: As directed       Allergies as of 06/01/2023       Reactions   Anesthetics, Halogenated Other (See Comments)   Per patient's daughter, she can not tolerate anesthetics.    Etomidate Other (See Comments)   Heart stopped during  anesthesia   Fentanyl Other (See Comments)   Heart stopped during anesthesia   Percocet [oxycodone-acetaminophen] Other (See Comments)   Headaches   Diazepam Other (See Comments)   Agitation   Lorazepam Nausea Only   Tramadol Hcl Nausea And Vomiting   Haloperidol Nausea And Vomiting   **Haldol**   Morphine Sulfate Nausea Only   agitation   Propoxyphene Nausea And Vomiting   **Darvocet** no longer available   Versed [midazolam] Other (See Comments)   Heart stopped during  anesthesia        Medication List     STOP taking these medications    cephALEXin 500 MG capsule Commonly known as: KEFLEX   doxycycline 100 MG capsule Commonly known as: VIBRAMYCIN       TAKE these medications    acetaminophen 650 MG CR tablet Commonly known as: TYLENOL Take 1,300 mg by mouth every 8 (eight) hours as needed for pain.   amLODipine 10 MG tablet Commonly known as: NORVASC Take 1 tablet (10 mg total) by mouth daily.   colestipol 1 g tablet Commonly known as: COLESTID Take 1 tablet (1 g total) by mouth 3 (three) times daily.   diclofenac Sodium 1 % Gel Commonly known as: VOLTAREN Apply 1 Application topically 4 (four) times daily as needed (knee pain).   diphenoxylate-atropine 2.5-0.025 MG tablet Commonly known as: Lomotil Take 1 tablet by mouth 2 (two) times daily as needed for diarrhea or loose stools.   Eliquis 2.5 MG Tabs tablet Generic drug: apixaban Take 1 tablet (2.5 mg total) by mouth 2 (two) times daily.   famotidine 20 MG tablet Commonly known as: PEPCID Take 20 mg by mouth daily as needed for heartburn or indigestion.   FLUoxetine 20 MG capsule Commonly known as: PROZAC Take 20 mg by mouth daily.   folic acid 1 MG tablet Commonly known as: FOLVITE Take 1 tablet (1 mg total) by mouth daily.   furosemide 40 MG tablet Commonly known as: LASIX Take 1 tablet (40 mg total) by mouth daily.   gabapentin 300 MG capsule Commonly known as: NEURONTIN Take 300 mg by mouth at bedtime.   HYDROcodone-acetaminophen 5-325 MG tablet Commonly known as: NORCO/VICODIN Take 2 tablets by mouth every 6 (six) hours as needed.   lipase/protease/amylase 95188 UNITS Cpep capsule Commonly known as: CREON Take 1 capsule (36,000 Units total) by mouth 3 (three) times daily before meals.   loperamide 2 MG capsule Commonly known as: IMODIUM Take 1 capsule (2 mg total) by mouth as needed for diarrhea or loose stools.   mirtazapine 7.5 MG  tablet Commonly known as: REMERON Take 7.5 mg by mouth at bedtime.   nitroGLYCERIN 0.4 MG SL tablet Commonly known as: NITROSTAT DISSOLVE 1 TABLET UNDER THE TONGUE EVERY 5 MINUTES AS  NEEDED FOR CHEST PAIN. MAX  OF 3 TABLETS IN 15 MINUTES. CALL 911 IF PAIN PERSISTS.   pantoprazole 40 MG tablet Commonly known as: PROTONIX Take 1 tablet (40 mg total) by mouth 2 (two) times daily. What changed: when to take this   rivastigmine 1.5 MG capsule Commonly known as: EXELON Take 1 capsule (1.5 mg total) by mouth 2 (two) times daily.   sevelamer carbonate 800 MG tablet Commonly known as: RENVELA Take 1 tablet (800 mg total) by mouth 3 (three) times daily with meals.   sodium bicarbonate 650 MG tablet Take 1 tablet (650 mg total) by mouth 2 (two) times daily.   tiZANidine 2 MG tablet Commonly known as: ZANAFLEX Take 2  mg by mouth at bedtime as needed.        Follow-up Information     [facility]   Specialty: Home Health Services Why: Awaiting Resumption of care orders. Contact information: [address] [place] [address] [phone]         Renford Dills, MD Follow up in 1 week(s).   Specialty: Internal Medicine Contact information: [address] [place] [address] [phone]                Allergies  Allergen Reactions   Anesthetics, Halogenated Other (See Comments)    Per patient's daughter, she can not tolerate anesthetics.    Etomidate Other (See Comments)    Heart stopped during anesthesia   Fentanyl Other (See Comments)    Heart stopped during anesthesia    Percocet [Oxycodone-Acetaminophen] Other (See Comments)    Headaches    Diazepam Other (See Comments)    Agitation   Lorazepam Nausea Only   Tramadol Hcl Nausea And Vomiting   Haloperidol Nausea And Vomiting    **Haldol**   Morphine Sulfate Nausea Only    agitation   Propoxyphene Nausea And Vomiting    **Darvocet** no longer available    Versed [Midazolam] Other (See Comments)    Heart stopped during anesthesia    The results of significant diagnostics from this hospitalization (including imaging, microbiology, ancillary and laboratory) are listed below for reference.    Microbiology: Recent Results (from the past 240 hours)  Calprotectin, Fecal     Status: Abnormal   Collection Time: 05/27/23  6:43 PM   Specimen: STOOL  Result Value Ref Range Status   Calprotectin, Fecal 242 (H) 0 - 120 ug/g Final    Comment: (NOTE) Concentration     Interpretation   Follow-Up < 5 - 50 ug/g     Normal           None >50 -120 ug/g     Borderline       Re-evaluate in 4-6 weeks    >120 ug/g     Abnormal         Repeat as clinically                                   indicated Performed At: [facility] [address] [place], [place] [id number] Jolene Schimke MD ZS:[id number]     Procedures/Studies: US RENAL Result Date: 05/28/2023 CLINICAL DATA:  Chronic kidney disease. EXAM: RENAL / URINARY TRACT ULTRASOUND COMPLETE COMPARISON:  04/08/2023, 02/03/2022. FINDINGS: Right Kidney: Renal measurements: 10.3 x 5.5 x 5.5 cm = volume: 165.4 mL. Increased echogenicity is noted. Cysts are present measuring 1.0 x 0.9 x 0.8 cm and 1.2 x 1.0 x 1.1 cm. No mass or hydronephrosis visualized. Left Kidney: Renal measurements: 10.1 x 4.9 x 5.9 cm = volume: 151.7 mL. Increased echogenicity is noted. Cysts are present measuring 2.3 x 1.5 x 2.2 cm and 1.0 x 0.9 x 0.9 cm. No mass or hydronephrosis visualized. Bladder: Appears normal for degree of bladder distention. Bilateral ureteral jets are noted. Other: None. IMPRESSION: Increased renal parenchymal echogenicity and bilateral renal cysts, compatible with medical renal disease. Electronically Signed   By: Thornell Sartorius M.D.   On: 05/28/2023 21:01   ECHOCARDIOGRAM COMPLETE Result Date: 05/26/2023    ECHOCARDIOGRAM REPORT   Patient Name:   Rayna Swaz[place]and Asbill Date of Exam: 05/26/2023 Medical  Rec #:  [id number]           Height:       64.0 in Accession #:    [id number]         Weight:       158.5 lb Date of Birth:  Jan 26, 1946          BSA:          1.772 m Patient Age:    77 years           BP:           157/68 mmHg Patient Gender: F                  HR:           85 bpm. Exam Location:  Inpatient Procedure: 2D Echo, Color Doppler and Cardiac Doppler (Both Spectral and Color            Flow Doppler were utilized during procedure). Indications:    CHF-Acute Systolic  History:        Patient has prior history of Echocardiogram examinations, most                 recent 10/24/2021. CHF, CAD, Signs/Symptoms:Shortness of Breath;                 Risk Factors:Hypertension and Diabetes.  Sonographer:    Rosaland Lao Sonographer#2:  Delcie Roch RDCS Referring Phys: [phone] EKTA V PATEL IMPRESSIONS  1. Left ventricular ejection fraction, by estimation, is 60 to 65%. The left ventricle has normal function. The left ventricle has no regional wall motion abnormalities. There is moderate left ventricular hypertrophy. Left ventricular diastolic parameters are consistent with Grade I diastolic dysfunction (impaired relaxation).  2. Right ventricular systolic function is normal. The right ventricular size is normal. There is normal pulmonary artery systolic pressure.  3. The mitral valve is abnormal. Mild mitral valve regurgitation. No evidence of mitral stenosis.  4. The aortic valve is tricuspid. Aortic valve regurgitation is not visualized. No aortic stenosis is present.  5. The inferior vena cava is normal in size with greater than 50% respiratory variability, suggesting right atrial pressure of 3 mmHg. FINDINGS  Left Ventricle: Left ventricular ejection fraction, by estimation, is 60 to 65%. The left ventricle has normal function. The left ventricle has no regional wall motion abnormalities. The left ventricular internal cavity size was normal in size. There is  moderate left ventricular hypertrophy. Left ventricular diastolic  parameters are consistent with Grade I diastolic dysfunction (impaired relaxation). Right Ventricle: The right ventricular size is normal. Right vetricular wall thickness was not well visualized. Right ventricular systolic function is normal. There is normal pulmonary artery systolic pressure. The tricuspid regurgitant velocity is 2.78 m/s, and with an assumed right atrial pressure of 3 mmHg, the estimated right ventricular systolic pressure is 33.9 mmHg. Left Atrium: Left atrial size was normal in size. Right Atrium: Right atrial size was normal in size. Pericardium: Trivial pericardial effusion is present. The pericardial effusion is circumferential. Mitral Valve: The mitral valve is abnormal. Mild mitral valve regurgitation. No evidence of mitral valve stenosis. Tricuspid Valve: The tricuspid valve is normal in structure. Tricuspid valve regurgitation is mild . No evidence of tricuspid stenosis. Aortic Valve: The aortic valve is tricuspid. Aortic valve regurgitation is not visualized. No aortic stenosis is present. Aortic valve mean gradient measures 3.1 mmHg. Aortic valve peak gradient measures 7.1 mmHg. Aortic valve area, by VTI measures 2.02 cm. Pulmonic Valve:  The pulmonic valve was not well visualized. Pulmonic valve regurgitation is not visualized. No evidence of pulmonic stenosis. Aorta: The aortic root and ascending aorta are structurally normal, with no evidence of dilitation. Venous: The inferior vena cava is normal in size with greater than 50% respiratory variability, suggesting right atrial pressure of 3 mmHg. IAS/Shunts: No atrial level shunt detected by color flow Doppler.  LEFT VENTRICLE PLAX 2D LVIDd:         4.40 cm LVIDs:         2.70 cm LV PW:         1.30 cm LV IVS:        1.20 cm LVOT diam:     2.10 cm LV SV:         58 LV SV Index:   33 LVOT Area:     3.46 cm  RIGHT VENTRICLE             IVC RV Basal diam:  3.90 cm     IVC diam: 1.40 cm RV S prime:     15.60 cm/s TAPSE (M-mode): 2.2 cm  LEFT ATRIUM           Index        RIGHT ATRIUM           Index LA diam:      3.90 cm 2.20 cm/m   RA Area:     12.70 cm LA Vol (A2C): 42.3 ml 23.87 ml/m  RA Volume:   25.10 ml  14.16 ml/m LA Vol (A4C): 58.3 ml 32.90 ml/m  AORTIC VALVE AV Area (Vmax):    2.36 cm AV Area (Vmean):   2.64 cm AV Area (VTI):     2.02 cm AV Vmax:           133.45 cm/s AV Vmean:          81.657 cm/s AV VTI:            0.288 m AV Peak Grad:      7.1 mmHg AV Mean Grad:      3.1 mmHg LVOT Vmax:         91.10 cm/s LVOT Vmean:        62.300 cm/s LVOT VTI:          0.168 m LVOT/AV VTI ratio: 0.58  AORTA Ao Root diam: 2.60 cm Ao Asc diam:  3.10 cm MITRAL VALVE               TRICUSPID VALVE MV Area (PHT): 3.53 cm    TR Peak grad:   30.9 mmHg MV Decel Time: 215 msec    TR Vmax:        278.00 cm/s MV E velocity: 70.20 cm/s MV A velocity: 97.50 cm/s  SHUNTS MV E/A ratio:  0.72        Systemic VTI:  0.17 m                            Systemic Diam: 2.10 cm Dina Rich MD Electronically signed by Dina Rich MD Signature Date/Time: 05/26/2023/2:46:41 PM    Final    DG Chest 2 View Result Date: 05/25/2023 CLINICAL DATA:  Chest pain EXAM: CHEST - 2 VIEW COMPARISON:  X-ray 05/05/2023 and older FINDINGS: Stable cardiopericardial silhouette. No consolidation, pneumothorax or effusion. No edema. Curvature and degenerative changes of the spine. Surgical changes in the right axillary region and right upper abdomen, lower chest. Coronary stent.  IMPRESSION: Chronic changes.  No acute cardiopulmonary disease. Electronically Signed   By: Karen Kays M.D.   On: 05/25/2023 13:01   DG Elbow Complete Right Result Date: 05/17/2023 CLINICAL DATA:  Right elbow pain, leukocytosis EXAM: RIGHT ELBOW - COMPLETE 3+ VIEW COMPARISON:  None Available. FINDINGS: There is no evidence of fracture, dislocation, or joint effusion. Mild degenerative changes. No discrete erosion or evidence of periosteal elevation. Soft tissue swelling over the posterior elbow and  proximal forearm. IMPRESSION: Soft tissue swelling over the posterior elbow and proximal forearm. No acute osseous abnormality. Electronically Signed   By: Duanne Guess D.O.   On: 05/17/2023 15:39   US Venous Img Lower  Left (DVT Study) Result Date: 05/05/2023 CLINICAL DATA:  Left knee pain for 2 days.  No known injury. EXAM: LEFT LOWER EXTREMITY VENOUS DOPPLER ULTRASOUND TECHNIQUE: Gray-scale sonography with compression, as well as color and duplex ultrasound, were performed to evaluate the deep venous system(s) from the level of the common femoral vein through the popliteal and proximal calf veins. COMPARISON:  None Available. FINDINGS: VENOUS Normal compressibility of the common femoral, superficial femoral, and popliteal veins, as well as the visualized calf veins. Visualized portions of profunda femoral vein and great saphenous vein unremarkable. No filling defects to suggest DVT on grayscale or color Doppler imaging. Doppler waveforms show normal direction of venous flow, normal respiratory plasticity and response to augmentation. Limited views of the contralateral common femoral vein are unremarkable. OTHER There is a 1.1 x 1.2 x 3.1 cm complex collection measured in the left medial popliteal fossa, which may represent popliteal/Baker cyst. Limitations: none IMPRESSION: *No acute deep vein thrombosis in the left lower extremity venous system. *Probable left popliteal/Baker's cyst measuring 1.1 x 1.2 x 3.1 cm. Electronically Signed   By: Jules Schick M.D.   On: 05/05/2023 16:46   DG Knee Complete 4 Views Left Result Date: 05/05/2023 CLINICAL DATA:  Left knee pain, no known injury. EXAM: LEFT KNEE - COMPLETE 4+ VIEW COMPARISON:  None Available. FINDINGS: Medial tibiofemoral joint space narrowing. Mild tricompartmental peripheral spurring. Small quadriceps tendon enthesophyte. Chondrocalcinosis. No fracture, erosion, or focal bone abnormality. No significant knee joint effusion. There are vascular  calcifications. IMPRESSION: 1. Mild tricompartmental osteoarthritis. 2. Chondrocalcinosis. Electronically Signed   By: Narda Rutherford M.D.   On: 05/05/2023 16:05   DG Chest 2 View Result Date: 05/05/2023 CLINICAL DATA:  Shortness of breath.  Chest pain for 2 weeks. EXAM: CHEST - 2 VIEW COMPARISON:  01/02/2023. FINDINGS: Bilateral lung fields are clear. Bilateral costophrenic angles are clear. Normal cardio-mediastinal silhouette. No acute osseous abnormalities. The soft tissues are within normal limits. Redemonstration of multiple surgical staples overlying the right axillary region and right breast region. IMPRESSION: No active cardiopulmonary disease. Electronically Signed   By: Jules Schick M.D.   On: 05/05/2023 13:04    Labs: BNP (last 3 results) Recent Labs    12/15/22 1802 01/02/23 1718 05/25/23 1219  BNP 182.7* 215.9* 336.3*   Basic Metabolic Panel: Recent Labs  Lab 05/25/23 2240 05/26/23 0616 05/26/23 1205 05/28/23 0459 05/29/23 0735 05/30/23 0404 05/31/23 0501 06/01/23 0500  NA  --   --    < > 137 140 140 140 139  K  --   --    < > 3.8 4.0 3.5 3.6 3.8  CL  --   --    < > 112* 107 108 110 109  CO2  --   --    < > 18* 22  18* 21* 19*  GLUCOSE  --   --    < > 87 92 79 81 96  BUN  --   --    < > 100* 105* 93* 91* 92*  CREATININE  --   --    < > 4.65* 4.45* 4.21* 4.24* 5.06*  CALCIUM  --   --    < > 8.1* 8.6* 8.5* 8.8* 8.5*  MG 0.9* 1.9  --   --   --   --   --   --   PHOS  --   --    < > 6.3* 5.5* 5.4* 5.4* 5.0*   < > = values in this interval not displayed.   Liver Function Tests: Recent Labs  Lab 05/25/23 1535 05/26/23 1205 05/28/23 0459 05/29/23 0735 05/30/23 0404 05/31/23 0501 06/01/23 0500  AST 14*  --   --   --   --   --   --   ALT 11  --   --   --   --   --   --   ALKPHOS 91  --   --   --   --   --   --   BILITOT 0.4  --   --   --   --   --   --   PROT 6.5  --   --   --   --   --   --   ALBUMIN 2.5*   < > 2.4* 2.3* 2.2* 2.1* 2.1*   < > = values in  this interval not displayed.   No results for input(s): "LIPASE", "AMYLASE" in the last 168 hours. No results for input(s): "AMMONIA" in the last 168 hours. CBC: Recent Labs  Lab 05/28/23 0459 05/29/23 0735 05/30/23 0404 05/31/23 0501 06/01/23 0855  WBC 9.2 8.6 9.2 7.2 9.1  HGB 7.9* 7.6* 7.4* 7.1* 7.7*  HCT 24.9* 23.9* 23.3* 23.1* 25.4*  MCV 87.1 86.3 86.9 89.2 90.4  PLT 282 267 289 270 328   No results for input(s): "VITAMINB12", "FOLATE", "FERRITIN", "TIBC", "IRON", "RETICCTPCT" in the last 72 hours. Urinalysis    Component Value Date/Time   COLORURINE STRAW (A) 05/26/2023 1610   APPEARANCEUR CLEAR 05/26/2023 1610   APPEARANCEUR Clear 08/22/2018 1620   LABSPEC 1.008 05/26/2023 1610   LABSPEC 1.020 03/03/2014 1341   PHURINE 5.0 05/26/2023 1610   GLUCOSEU NEGATIVE 05/26/2023 1610   GLUCOSEU Negative 03/03/2014 1341   HGBUR SMALL (A) 05/26/2023 1610   BILIRUBINUR NEGATIVE 05/26/2023 1610   BILIRUBINUR Negative 08/22/2018 1620   BILIRUBINUR Negative 03/03/2014 1341   KETONESUR NEGATIVE 05/26/2023 1610   PROTEINUR 100 (A) 05/26/2023 1610   UROBILINOGEN 0.2 03/03/2014 1341   NITRITE NEGATIVE 05/26/2023 1610   LEUKOCYTESUR NEGATIVE 05/26/2023 1610   LEUKOCYTESUR Negative 03/03/2014 1341   Sepsis Labs Recent Labs  Lab 05/29/23 0735 05/30/23 0404 05/31/23 0501 06/01/23 0855  WBC 8.6 9.2 7.2 9.1   Microbiology Recent Results (from the past 240 hours)  Calprotectin, Fecal     Status: Abnormal   Collection Time: 05/27/23  6:43 PM   Specimen: STOOL  Result Value Ref Range Status   Calprotectin, Fecal 242 (H) 0 - 120 ug/g Final    Comment: (NOTE) Concentration     Interpretation   Follow-Up < 5 - 50 ug/g     Normal           None >50 -120 ug/g     Borderline  Re-evaluate in 4-6 weeks    >120 ug/g     Abnormal         Repeat as clinically                                   indicated Performed At: [facility] [address] [place], [place]  [id number] Jolene Schimke MD NW:[id number]    Time coordinating discharge: 35 minutes  SIGNED: Lanae Boast, MD  [facility] 06/01/2023, 10:47 AM  If 7PM-7AM, please contact night-coverage [email or web address]

## 2023-06-03 ENCOUNTER — Encounter (HOSPITAL_COMMUNITY): Payer: Self-pay | Admitting: Gastroenterology

## 2023-06-03 DIAGNOSIS — M7021 Olecranon bursitis, right elbow: Secondary | ICD-10-CM | POA: Diagnosis not present

## 2023-06-03 DIAGNOSIS — M170 Bilateral primary osteoarthritis of knee: Secondary | ICD-10-CM | POA: Diagnosis not present

## 2023-06-06 ENCOUNTER — Telehealth: Payer: Self-pay | Admitting: Hematology

## 2023-06-06 DIAGNOSIS — I5189 Other ill-defined heart diseases: Secondary | ICD-10-CM | POA: Diagnosis not present

## 2023-06-06 DIAGNOSIS — E669 Obesity, unspecified: Secondary | ICD-10-CM | POA: Diagnosis not present

## 2023-06-06 DIAGNOSIS — I10 Essential (primary) hypertension: Secondary | ICD-10-CM | POA: Diagnosis not present

## 2023-06-06 DIAGNOSIS — I130 Hypertensive heart and chronic kidney disease with heart failure and stage 1 through stage 4 chronic kidney disease, or unspecified chronic kidney disease: Secondary | ICD-10-CM | POA: Diagnosis not present

## 2023-06-06 DIAGNOSIS — R413 Other amnesia: Secondary | ICD-10-CM | POA: Diagnosis not present

## 2023-06-06 DIAGNOSIS — D638 Anemia in other chronic diseases classified elsewhere: Secondary | ICD-10-CM | POA: Diagnosis not present

## 2023-06-06 DIAGNOSIS — I5032 Chronic diastolic (congestive) heart failure: Secondary | ICD-10-CM | POA: Diagnosis not present

## 2023-06-06 DIAGNOSIS — R7989 Other specified abnormal findings of blood chemistry: Secondary | ICD-10-CM | POA: Diagnosis not present

## 2023-06-06 DIAGNOSIS — I700 Atherosclerosis of aorta: Secondary | ICD-10-CM | POA: Diagnosis not present

## 2023-06-06 DIAGNOSIS — N179 Acute kidney failure, unspecified: Secondary | ICD-10-CM | POA: Diagnosis not present

## 2023-06-06 DIAGNOSIS — N184 Chronic kidney disease, stage 4 (severe): Secondary | ICD-10-CM | POA: Diagnosis not present

## 2023-06-06 DIAGNOSIS — E1169 Type 2 diabetes mellitus with other specified complication: Secondary | ICD-10-CM | POA: Diagnosis not present

## 2023-06-06 DIAGNOSIS — M329 Systemic lupus erythematosus, unspecified: Secondary | ICD-10-CM | POA: Diagnosis not present

## 2023-06-06 DIAGNOSIS — E211 Secondary hyperparathyroidism, not elsewhere classified: Secondary | ICD-10-CM | POA: Diagnosis not present

## 2023-06-06 DIAGNOSIS — N185 Chronic kidney disease, stage 5: Secondary | ICD-10-CM | POA: Diagnosis not present

## 2023-06-06 DIAGNOSIS — E1122 Type 2 diabetes mellitus with diabetic chronic kidney disease: Secondary | ICD-10-CM | POA: Diagnosis not present

## 2023-06-06 NOTE — Telephone Encounter (Signed)
 Per voicemail received on 06/06/2023 patient's daughter was needing to reschedule cancelled appointment; left patient's daughter a voicemail in regards to scheduling and left callback number if patient wishes to reschedule

## 2023-06-14 DIAGNOSIS — E1122 Type 2 diabetes mellitus with diabetic chronic kidney disease: Secondary | ICD-10-CM | POA: Diagnosis not present

## 2023-06-14 DIAGNOSIS — E211 Secondary hyperparathyroidism, not elsewhere classified: Secondary | ICD-10-CM | POA: Diagnosis not present

## 2023-06-14 DIAGNOSIS — M329 Systemic lupus erythematosus, unspecified: Secondary | ICD-10-CM | POA: Diagnosis not present

## 2023-06-14 DIAGNOSIS — I5032 Chronic diastolic (congestive) heart failure: Secondary | ICD-10-CM | POA: Diagnosis not present

## 2023-06-14 DIAGNOSIS — N179 Acute kidney failure, unspecified: Secondary | ICD-10-CM | POA: Diagnosis not present

## 2023-06-14 DIAGNOSIS — E1169 Type 2 diabetes mellitus with other specified complication: Secondary | ICD-10-CM | POA: Diagnosis not present

## 2023-06-14 DIAGNOSIS — I130 Hypertensive heart and chronic kidney disease with heart failure and stage 1 through stage 4 chronic kidney disease, or unspecified chronic kidney disease: Secondary | ICD-10-CM | POA: Diagnosis not present

## 2023-06-14 DIAGNOSIS — E669 Obesity, unspecified: Secondary | ICD-10-CM | POA: Diagnosis not present

## 2023-06-14 DIAGNOSIS — N184 Chronic kidney disease, stage 4 (severe): Secondary | ICD-10-CM | POA: Diagnosis not present

## 2023-06-15 ENCOUNTER — Other Ambulatory Visit: Payer: Self-pay

## 2023-06-15 ENCOUNTER — Encounter (HOSPITAL_COMMUNITY): Payer: Self-pay

## 2023-06-15 ENCOUNTER — Emergency Department (HOSPITAL_COMMUNITY)

## 2023-06-15 ENCOUNTER — Inpatient Hospital Stay (HOSPITAL_COMMUNITY)
Admission: EM | Admit: 2023-06-15 | Discharge: 2023-07-08 | DRG: 252 | Disposition: A | Discharge status: Home / Self Care | Attending: Internal Medicine | Admitting: Internal Medicine

## 2023-06-15 DIAGNOSIS — D649 Anemia, unspecified: Secondary | ICD-10-CM | POA: Diagnosis not present

## 2023-06-15 DIAGNOSIS — K2970 Gastritis, unspecified, without bleeding: Secondary | ICD-10-CM | POA: Diagnosis present

## 2023-06-15 DIAGNOSIS — R059 Cough, unspecified: Secondary | ICD-10-CM | POA: Diagnosis not present

## 2023-06-15 DIAGNOSIS — T380X5A Adverse effect of glucocorticoids and synthetic analogues, initial encounter: Secondary | ICD-10-CM | POA: Diagnosis present

## 2023-06-15 DIAGNOSIS — T82848A Pain from vascular prosthetic devices, implants and grafts, initial encounter: Secondary | ICD-10-CM | POA: Diagnosis not present

## 2023-06-15 DIAGNOSIS — F03A3 Unspecified dementia, mild, with mood disturbance: Secondary | ICD-10-CM | POA: Diagnosis present

## 2023-06-15 DIAGNOSIS — D631 Anemia in chronic kidney disease: Secondary | ICD-10-CM | POA: Diagnosis present

## 2023-06-15 DIAGNOSIS — N179 Acute kidney failure, unspecified: Secondary | ICD-10-CM | POA: Diagnosis present

## 2023-06-15 DIAGNOSIS — I5033 Acute on chronic diastolic (congestive) heart failure: Secondary | ICD-10-CM | POA: Diagnosis present

## 2023-06-15 DIAGNOSIS — K2901 Acute gastritis with bleeding: Secondary | ICD-10-CM | POA: Diagnosis not present

## 2023-06-15 DIAGNOSIS — R5381 Other malaise: Secondary | ICD-10-CM | POA: Diagnosis not present

## 2023-06-15 DIAGNOSIS — M00861 Arthritis due to other bacteria, right knee: Secondary | ICD-10-CM | POA: Diagnosis not present

## 2023-06-15 DIAGNOSIS — Y832 Surgical operation with anastomosis, bypass or graft as the cause of abnormal reaction of the patient, or of later complication, without mention of misadventure at the time of the procedure: Secondary | ICD-10-CM | POA: Diagnosis not present

## 2023-06-15 DIAGNOSIS — E119 Type 2 diabetes mellitus without complications: Secondary | ICD-10-CM

## 2023-06-15 DIAGNOSIS — I2489 Other forms of acute ischemic heart disease: Secondary | ICD-10-CM | POA: Diagnosis present

## 2023-06-15 DIAGNOSIS — Z4901 Encounter for fitting and adjustment of extracorporeal dialysis catheter: Secondary | ICD-10-CM | POA: Diagnosis not present

## 2023-06-15 DIAGNOSIS — E785 Hyperlipidemia, unspecified: Secondary | ICD-10-CM | POA: Diagnosis present

## 2023-06-15 DIAGNOSIS — M79672 Pain in left foot: Secondary | ICD-10-CM | POA: Diagnosis not present

## 2023-06-15 DIAGNOSIS — R159 Full incontinence of feces: Secondary | ICD-10-CM | POA: Diagnosis not present

## 2023-06-15 DIAGNOSIS — I120 Hypertensive chronic kidney disease with stage 5 chronic kidney disease or end stage renal disease: Secondary | ICD-10-CM | POA: Diagnosis not present

## 2023-06-15 DIAGNOSIS — Z86718 Personal history of other venous thrombosis and embolism: Secondary | ICD-10-CM

## 2023-06-15 DIAGNOSIS — I4891 Unspecified atrial fibrillation: Secondary | ICD-10-CM | POA: Diagnosis present

## 2023-06-15 DIAGNOSIS — C50311 Malignant neoplasm of lower-inner quadrant of right female breast: Secondary | ICD-10-CM | POA: Diagnosis not present

## 2023-06-15 DIAGNOSIS — E1165 Type 2 diabetes mellitus with hyperglycemia: Secondary | ICD-10-CM | POA: Diagnosis present

## 2023-06-15 DIAGNOSIS — E1122 Type 2 diabetes mellitus with diabetic chronic kidney disease: Secondary | ICD-10-CM | POA: Diagnosis present

## 2023-06-15 DIAGNOSIS — K219 Gastro-esophageal reflux disease without esophagitis: Secondary | ICD-10-CM | POA: Diagnosis present

## 2023-06-15 DIAGNOSIS — K5901 Slow transit constipation: Secondary | ICD-10-CM | POA: Diagnosis not present

## 2023-06-15 DIAGNOSIS — E872 Acidosis, unspecified: Secondary | ICD-10-CM | POA: Diagnosis present

## 2023-06-15 DIAGNOSIS — Z853 Personal history of malignant neoplasm of breast: Secondary | ICD-10-CM

## 2023-06-15 DIAGNOSIS — M329 Systemic lupus erythematosus, unspecified: Secondary | ICD-10-CM | POA: Diagnosis present

## 2023-06-15 DIAGNOSIS — M545 Low back pain, unspecified: Secondary | ICD-10-CM | POA: Diagnosis present

## 2023-06-15 DIAGNOSIS — Z884 Allergy status to anesthetic agent status: Secondary | ICD-10-CM

## 2023-06-15 DIAGNOSIS — Z8616 Personal history of COVID-19: Secondary | ICD-10-CM

## 2023-06-15 DIAGNOSIS — D509 Iron deficiency anemia, unspecified: Secondary | ICD-10-CM | POA: Diagnosis present

## 2023-06-15 DIAGNOSIS — R0609 Other forms of dyspnea: Secondary | ICD-10-CM | POA: Diagnosis not present

## 2023-06-15 DIAGNOSIS — N185 Chronic kidney disease, stage 5: Secondary | ICD-10-CM | POA: Diagnosis not present

## 2023-06-15 DIAGNOSIS — F0390 Unspecified dementia without behavioral disturbance: Secondary | ICD-10-CM | POA: Diagnosis present

## 2023-06-15 DIAGNOSIS — Z794 Long term (current) use of insulin: Secondary | ICD-10-CM | POA: Diagnosis not present

## 2023-06-15 DIAGNOSIS — F32A Depression, unspecified: Secondary | ICD-10-CM | POA: Diagnosis present

## 2023-06-15 DIAGNOSIS — I509 Heart failure, unspecified: Secondary | ICD-10-CM | POA: Diagnosis not present

## 2023-06-15 DIAGNOSIS — I5043 Acute on chronic combined systolic (congestive) and diastolic (congestive) heart failure: Secondary | ICD-10-CM | POA: Diagnosis present

## 2023-06-15 DIAGNOSIS — Z888 Allergy status to other drugs, medicaments and biological substances status: Secondary | ICD-10-CM

## 2023-06-15 DIAGNOSIS — I132 Hypertensive heart and chronic kidney disease with heart failure and with stage 5 chronic kidney disease, or end stage renal disease: Principal | ICD-10-CM | POA: Diagnosis present

## 2023-06-15 DIAGNOSIS — L02414 Cutaneous abscess of left upper limb: Secondary | ICD-10-CM | POA: Diagnosis not present

## 2023-06-15 DIAGNOSIS — R0600 Dyspnea, unspecified: Secondary | ICD-10-CM | POA: Diagnosis not present

## 2023-06-15 DIAGNOSIS — E8770 Fluid overload, unspecified: Secondary | ICD-10-CM | POA: Insufficient documentation

## 2023-06-15 DIAGNOSIS — K50911 Crohn's disease, unspecified, with rectal bleeding: Secondary | ICD-10-CM | POA: Diagnosis present

## 2023-06-15 DIAGNOSIS — Y718 Miscellaneous cardiovascular devices associated with adverse incidents, not elsewhere classified: Secondary | ICD-10-CM | POA: Diagnosis present

## 2023-06-15 DIAGNOSIS — G8929 Other chronic pain: Secondary | ICD-10-CM | POA: Diagnosis present

## 2023-06-15 DIAGNOSIS — Z885 Allergy status to narcotic agent status: Secondary | ICD-10-CM

## 2023-06-15 DIAGNOSIS — Z992 Dependence on renal dialysis: Secondary | ICD-10-CM | POA: Diagnosis not present

## 2023-06-15 DIAGNOSIS — M109 Gout, unspecified: Secondary | ICD-10-CM | POA: Diagnosis not present

## 2023-06-15 DIAGNOSIS — Z170 Estrogen receptor positive status [ER+]: Secondary | ICD-10-CM | POA: Diagnosis not present

## 2023-06-15 DIAGNOSIS — M48062 Spinal stenosis, lumbar region with neurogenic claudication: Secondary | ICD-10-CM | POA: Diagnosis not present

## 2023-06-15 DIAGNOSIS — M7989 Other specified soft tissue disorders: Secondary | ICD-10-CM | POA: Diagnosis present

## 2023-06-15 DIAGNOSIS — I5023 Acute on chronic systolic (congestive) heart failure: Principal | ICD-10-CM

## 2023-06-15 DIAGNOSIS — D513 Other dietary vitamin B12 deficiency anemia: Secondary | ICD-10-CM | POA: Diagnosis present

## 2023-06-15 DIAGNOSIS — I2583 Coronary atherosclerosis due to lipid rich plaque: Secondary | ICD-10-CM | POA: Diagnosis not present

## 2023-06-15 DIAGNOSIS — M25422 Effusion, left elbow: Secondary | ICD-10-CM | POA: Diagnosis not present

## 2023-06-15 DIAGNOSIS — N186 End stage renal disease: Secondary | ICD-10-CM | POA: Diagnosis present

## 2023-06-15 DIAGNOSIS — I10 Essential (primary) hypertension: Secondary | ICD-10-CM | POA: Diagnosis not present

## 2023-06-15 DIAGNOSIS — Z452 Encounter for adjustment and management of vascular access device: Secondary | ICD-10-CM | POA: Diagnosis not present

## 2023-06-15 DIAGNOSIS — I452 Bifascicular block: Secondary | ICD-10-CM | POA: Diagnosis present

## 2023-06-15 DIAGNOSIS — I5032 Chronic diastolic (congestive) heart failure: Secondary | ICD-10-CM | POA: Diagnosis present

## 2023-06-15 DIAGNOSIS — I2510 Atherosclerotic heart disease of native coronary artery without angina pectoris: Secondary | ICD-10-CM | POA: Diagnosis present

## 2023-06-15 DIAGNOSIS — M11262 Other chondrocalcinosis, left knee: Secondary | ICD-10-CM | POA: Diagnosis not present

## 2023-06-15 DIAGNOSIS — B9689 Other specified bacterial agents as the cause of diseases classified elsewhere: Secondary | ICD-10-CM | POA: Diagnosis not present

## 2023-06-15 DIAGNOSIS — Z803 Family history of malignant neoplasm of breast: Secondary | ICD-10-CM

## 2023-06-15 DIAGNOSIS — Z8249 Family history of ischemic heart disease and other diseases of the circulatory system: Secondary | ICD-10-CM

## 2023-06-15 DIAGNOSIS — T8453XA Infection and inflammatory reaction due to internal right knee prosthesis, initial encounter: Secondary | ICD-10-CM | POA: Diagnosis not present

## 2023-06-15 DIAGNOSIS — E440 Moderate protein-calorie malnutrition: Secondary | ICD-10-CM | POA: Diagnosis present

## 2023-06-15 DIAGNOSIS — M4802 Spinal stenosis, cervical region: Secondary | ICD-10-CM | POA: Diagnosis not present

## 2023-06-15 DIAGNOSIS — Z923 Personal history of irradiation: Secondary | ICD-10-CM

## 2023-06-15 DIAGNOSIS — Z8673 Personal history of transient ischemic attack (TIA), and cerebral infarction without residual deficits: Secondary | ICD-10-CM

## 2023-06-15 DIAGNOSIS — M948X2 Other specified disorders of cartilage, upper arm: Secondary | ICD-10-CM | POA: Diagnosis not present

## 2023-06-15 DIAGNOSIS — M898X9 Other specified disorders of bone, unspecified site: Secondary | ICD-10-CM | POA: Diagnosis present

## 2023-06-15 DIAGNOSIS — R509 Fever, unspecified: Secondary | ICD-10-CM | POA: Diagnosis not present

## 2023-06-15 DIAGNOSIS — M009 Pyogenic arthritis, unspecified: Secondary | ICD-10-CM | POA: Diagnosis present

## 2023-06-15 DIAGNOSIS — R197 Diarrhea, unspecified: Secondary | ICD-10-CM | POA: Diagnosis not present

## 2023-06-15 DIAGNOSIS — G458 Other transient cerebral ischemic attacks and related syndromes: Secondary | ICD-10-CM | POA: Diagnosis not present

## 2023-06-15 DIAGNOSIS — N2581 Secondary hyperparathyroidism of renal origin: Secondary | ICD-10-CM | POA: Diagnosis present

## 2023-06-15 DIAGNOSIS — T82898A Other specified complication of vascular prosthetic devices, implants and grafts, initial encounter: Secondary | ICD-10-CM | POA: Diagnosis not present

## 2023-06-15 DIAGNOSIS — I110 Hypertensive heart disease with heart failure: Secondary | ICD-10-CM | POA: Diagnosis not present

## 2023-06-15 DIAGNOSIS — Z79899 Other long term (current) drug therapy: Secondary | ICD-10-CM

## 2023-06-15 DIAGNOSIS — Z7901 Long term (current) use of anticoagulants: Secondary | ICD-10-CM

## 2023-06-15 DIAGNOSIS — L03114 Cellulitis of left upper limb: Secondary | ICD-10-CM | POA: Diagnosis present

## 2023-06-15 DIAGNOSIS — L89626 Pressure-induced deep tissue damage of left heel: Secondary | ICD-10-CM | POA: Diagnosis not present

## 2023-06-15 DIAGNOSIS — E1140 Type 2 diabetes mellitus with diabetic neuropathy, unspecified: Secondary | ICD-10-CM | POA: Diagnosis present

## 2023-06-15 DIAGNOSIS — E8779 Other fluid overload: Secondary | ICD-10-CM | POA: Diagnosis not present

## 2023-06-15 DIAGNOSIS — D72829 Elevated white blood cell count, unspecified: Secondary | ICD-10-CM | POA: Diagnosis not present

## 2023-06-15 DIAGNOSIS — M11261 Other chondrocalcinosis, right knee: Secondary | ICD-10-CM | POA: Diagnosis not present

## 2023-06-15 DIAGNOSIS — Z833 Family history of diabetes mellitus: Secondary | ICD-10-CM

## 2023-06-15 LAB — GLUCOSE, CAPILLARY: Glucose-Capillary: 109 mg/dL — ABNORMAL HIGH (ref 70–99)

## 2023-06-15 LAB — CBC
HCT: 24.1 % — ABNORMAL LOW (ref 36.0–46.0)
Hemoglobin: 7 g/dL — ABNORMAL LOW (ref 12.0–15.0)
MCH: 28 pg (ref 26.0–34.0)
MCHC: 29 g/dL — ABNORMAL LOW (ref 30.0–36.0)
MCV: 96.4 fL (ref 80.0–100.0)
Platelets: 192 10*3/uL (ref 150–400)
RBC: 2.5 MIL/uL — ABNORMAL LOW (ref 3.87–5.11)
RDW: 19.9 % — ABNORMAL HIGH (ref 11.5–15.5)
WBC: 11.4 10*3/uL — ABNORMAL HIGH (ref 4.0–10.5)
nRBC: 0 % (ref 0.0–0.2)

## 2023-06-15 LAB — BASIC METABOLIC PANEL WITH GFR
Anion gap: 11 (ref 5–15)
BUN: 82 mg/dL — ABNORMAL HIGH (ref 8–23)
CO2: 20 mmol/L — ABNORMAL LOW (ref 22–32)
Calcium: 8.6 mg/dL — ABNORMAL LOW (ref 8.9–10.3)
Chloride: 109 mmol/L (ref 98–111)
Creatinine, Ser: 4.08 mg/dL — ABNORMAL HIGH (ref 0.44–1.00)
GFR, Estimated: 11 mL/min — ABNORMAL LOW (ref >60)
Glucose, Bld: 147 mg/dL — ABNORMAL HIGH (ref 70–99)
Potassium: 5 mmol/L (ref 3.5–5.1)
Sodium: 140 mmol/L (ref 135–145)

## 2023-06-15 LAB — TROPONIN I (HIGH SENSITIVITY): Troponin I (High Sensitivity): 125 ng/L (ref <18)

## 2023-06-15 LAB — BRAIN NATRIURETIC PEPTIDE: B Natriuretic Peptide: 560.4 pg/mL — ABNORMAL HIGH (ref 0.0–100.0)

## 2023-06-15 MED ORDER — PANCRELIPASE (LIP-PROT-AMYL) 36000-114000 UNITS PO CPEP
36000.0000 [IU] | ORAL_CAPSULE | Freq: Three times a day (TID) | ORAL | Status: DC
  Administered 2023-06-16 – 2023-07-08 (×57): 36000 [IU] via ORAL
  Filled 2023-06-15 (×66): qty 1

## 2023-06-15 MED ORDER — MIRTAZAPINE 15 MG PO TABS
7.5000 mg | ORAL_TABLET | Freq: Every day | ORAL | Status: DC
  Administered 2023-06-15 – 2023-07-07 (×23): 7.5 mg via ORAL
  Filled 2023-06-15 (×24): qty 1

## 2023-06-15 MED ORDER — SEVELAMER CARBONATE 800 MG PO TABS
800.0000 mg | ORAL_TABLET | Freq: Three times a day (TID) | ORAL | Status: DC
  Administered 2023-06-16 – 2023-07-07 (×52): 800 mg via ORAL
  Filled 2023-06-15 (×54): qty 1

## 2023-06-15 MED ORDER — FOLIC ACID 1 MG PO TABS
1.0000 mg | ORAL_TABLET | Freq: Every day | ORAL | Status: DC
  Administered 2023-06-16 – 2023-07-08 (×22): 1 mg via ORAL
  Filled 2023-06-15 (×22): qty 1

## 2023-06-15 MED ORDER — PANTOPRAZOLE SODIUM 40 MG PO TBEC
40.0000 mg | DELAYED_RELEASE_TABLET | Freq: Two times a day (BID) | ORAL | Status: DC
  Administered 2023-06-15 – 2023-07-07 (×43): 40 mg via ORAL
  Filled 2023-06-15 (×21): qty 1
  Filled 2023-06-15: qty 2
  Filled 2023-06-15 (×21): qty 1

## 2023-06-15 MED ORDER — AMLODIPINE BESYLATE 10 MG PO TABS
10.0000 mg | ORAL_TABLET | Freq: Every day | ORAL | Status: DC
Start: 2023-06-16 — End: 2023-06-23
  Administered 2023-06-16 – 2023-06-23 (×8): 10 mg via ORAL
  Filled 2023-06-15 (×8): qty 1

## 2023-06-15 MED ORDER — GABAPENTIN 300 MG PO CAPS
300.0000 mg | ORAL_CAPSULE | Freq: Every day | ORAL | Status: DC
  Administered 2023-06-15 – 2023-07-07 (×23): 300 mg via ORAL
  Filled 2023-06-15 (×23): qty 1

## 2023-06-15 MED ORDER — INSULIN ASPART 100 UNIT/ML IJ SOLN
0.0000 [IU] | Freq: Every day | INTRAMUSCULAR | Status: DC
  Administered 2023-06-19 – 2023-06-21 (×3): 2 [IU] via SUBCUTANEOUS

## 2023-06-15 MED ORDER — FLUOXETINE HCL 20 MG PO CAPS
20.0000 mg | ORAL_CAPSULE | Freq: Every day | ORAL | Status: DC
  Administered 2023-06-16 – 2023-07-08 (×22): 20 mg via ORAL
  Filled 2023-06-15 (×22): qty 1

## 2023-06-15 MED ORDER — COLESTIPOL HCL 1 G PO TABS
1.0000 g | ORAL_TABLET | Freq: Three times a day (TID) | ORAL | Status: DC
  Administered 2023-06-15 – 2023-07-07 (×59): 1 g via ORAL
  Filled 2023-06-15 (×71): qty 1

## 2023-06-15 MED ORDER — INSULIN ASPART 100 UNIT/ML IJ SOLN
0.0000 [IU] | Freq: Three times a day (TID) | INTRAMUSCULAR | Status: DC
  Administered 2023-06-19: 2 [IU] via SUBCUTANEOUS
  Administered 2023-06-20: 1 [IU] via SUBCUTANEOUS
  Administered 2023-06-20: 2 [IU] via SUBCUTANEOUS
  Administered 2023-06-22 (×2): 1 [IU] via SUBCUTANEOUS

## 2023-06-15 MED ORDER — FUROSEMIDE 10 MG/ML IJ SOLN
40.0000 mg | Freq: Once | INTRAMUSCULAR | Status: AC
  Administered 2023-06-15: 40 mg via INTRAVENOUS
  Filled 2023-06-15: qty 4

## 2023-06-15 MED ORDER — RIVASTIGMINE TARTRATE 1.5 MG PO CAPS
1.5000 mg | ORAL_CAPSULE | Freq: Two times a day (BID) | ORAL | Status: DC
  Administered 2023-06-15 – 2023-07-07 (×41): 1.5 mg via ORAL
  Filled 2023-06-15 (×48): qty 1

## 2023-06-15 MED ORDER — SODIUM BICARBONATE 650 MG PO TABS
650.0000 mg | ORAL_TABLET | Freq: Two times a day (BID) | ORAL | Status: DC
  Administered 2023-06-15 – 2023-06-16 (×3): 650 mg via ORAL
  Filled 2023-06-15 (×3): qty 1

## 2023-06-15 MED ORDER — APIXABAN 2.5 MG PO TABS
2.5000 mg | ORAL_TABLET | Freq: Two times a day (BID) | ORAL | Status: DC
  Filled 2023-06-15: qty 1

## 2023-06-15 NOTE — ED Triage Notes (Signed)
 Pt reports sob on exertion and bilateral leg swelling, pt was recently discharged from the hospital for the same. Pt denies chest pain. Resp e.u

## 2023-06-15 NOTE — H&P (Signed)
 History and Physical    [name] NFA:[id number] DOB: June 09, 1945 DOA: 06/15/2023  PCP: Renford Dills, MD  Patient coming from: Home  Chief Complaint: Shortness of breath  HPI: [name] is a 78 y.o. female with medical history significant of CAD status post PCI in 2000, hypertension, hyperlipidemia, CKD stage IV, history of lower extremity DVT/renal vein thrombosis on Eliquis, CVA, chronic HFpEF, depression/memory loss, chronic diarrhea, type 2 diabetes, history of breast cancer, lupus.  Patient was recently admitted 3/8-3/15 for volume overload/acute on chronic HFpEF/progressive CKD stage V versus AKI on CKD.  Nephrology and cardiology were consulted and patient was diuresed.  She was also seen by GI due to acute on chronic anemia, hematochezia, and acute on chronic diarrhea.  Underwent EGD and colonoscopy with no active bleeding and Eliquis was resumed.  Patient was discharged on Lasix 40 mg daily.  Patient presents today with complaints of progressively worsening dyspnea on exertion, bilateral lower extremity edema, and weight gain.  She is also reporting some mild substernal chest pain at this time which she describes as burning and heaviness like sensation.  She reports history of GERD and chronic intermittent burning chest pain.  She takes Protonix at home.  Denies fevers or cough.  Family concerned that patient has gained about 20 pounds since her hospital discharge 2 weeks ago.  She is taking Lasix 40 mg daily and her nephrologist gave her a prescription for Lasix 80 mg PRN for volume overload which she took for the first time this morning.  She is making urine but family thinks her urine output might not be adequate.  Daughter states patient has an upcoming appointment with nephrology in a few days to discuss dialysis.  Patient is no longer having any bloody stools.  She denies hematemesis, hematochezia, or melena.  Denies abdominal pain.  ED Course: Blood pressure elevated  with systolic up to 160s but remainder of vital signs stable.  Labs notable for WBC count 11.4, hemoglobin 7.0, MCV 96.4, potassium 5.0, bicarb 20, glucose 147, BUN 82, creatinine 4.0, GFR 11, BNP 560.  Chest x-ray not suggestive of pulmonary edema.  EKG showing sinus rhythm and no acute ischemic changes. Patient was given IV Lasix 40 mg.  Review of Systems:  Review of Systems  All other systems reviewed and are negative.   Past Medical History:  Diagnosis Date   Abdominal discomfort    Chronic N/V/D. Presumptive dx Crohn's dx per elevated p ANCA. Failed Entocort and Pentasa. Sep 2003 - ileocolectomy c anastomosis per Dr Orson Slick 2/2 adhesions - path was hegative for Crohns. EGD, Sm bowel follow through (11/03), and an eteroclysis (10/03) were unrevealing. Cuases hypomag and hypocalcemia.   Adnexal mass 10/2001   s/p lap BSO (R ovarian fibroma) & lysis of adhesions   Allergy    Seasonal   Anemia    Multifactorial. Baseline HgB 10-11 ish. B12 def - 150 in 3/10. Fe Def - ferritin 35 3/10. Both are being repleted.   Breast cancer (HCC) 03/16/2013   right, 5 o'clock   CAD (coronary artery disease) 1996   1996 - PTCA and angioplasty diagonal branch. 2000 - Rotoblator & angiopllasty of diagonal. 2006 - subendocardial AMI, DES to proximal LAD.Marland Kitchen Also had 90% stenosis in distal apical LAD. EF 55 with apical hypokinesis. Indefinite ASA and Plavix.   CHF (congestive heart failure) (HCC)    Chronic kidney disease, stage 4 (severe) (HCC)    Chronic pain    CT 10/10 = Spinal  stenosis L2 - S1.   COVID-19 11/02/2020   Diabetes mellitus    Insulin dependent   Diabetes mellitus type 2 in obese 05/03/2010   Managed on lantus and novolog. Has diabetic nephropathy. Metformin D/C'd 2012 2/2 creatinine.  No diabetic retinopathy per 3/11.    Gout    Hx of radiation therapy 06/02/13- 07/16/13   right rbeast 4500 cGy 25 sessions, right breast boost 1600 cGy in 8 sessions   Hyperlipidemia    Managed with both a  statin and Welchol. Welchol stopped 2014 2/2 cost and started on fenofibrate    Hypertension    2006 B renal arteries patent. 2003 MRA - no RAS. 2003 pheo W/U Dr Caryn Section reportedly negative.   Hypoxia 07/23/2017   Lupus    Lymphedema of breast    Personal history of radiation therapy 2015   Pneumonia of left lung due to infectious organism    RBBB    Renal vein thrombosis (HCC)    SBO (small bowel obstruction) (HCC) 09/17/2017   Secondary hyperparathyroidism (HCC)    Stroke (HCC)    Incidental finding MRI 2002 L lacunar infarct   Vitamin B12 deficiency    Vitamin D deficiency    Wears dentures    top    Past Surgical History:  Procedure Laterality Date   ABDOMINAL HYSTERECTOMY     BILATERAL SALPINGOOPHORECTOMY  8/03   Lap BSO (R ovarian fibroma) and adhesion lysis   BIOPSY  09/08/2020   Procedure: BIOPSY;  Surgeon: Bernette Redbird, MD;  Location: [facility];  Service: Endoscopy;;   BOWEL RESECTION  2003   ileocolectomy with anastomosis 2/2 adhesions   BREAST BIOPSY Right 2015   BREAST BIOPSY Right 07/2014   BREAST LUMPECTOMY Right 04/22/2013   BREAST LUMPECTOMY WITH NEEDLE LOCALIZATION AND AXILLARY SENTINEL LYMPH NODE BX Right 04/22/2013   Procedure: BREAST LUMPECTOMY WITH NEEDLE LOCALIZATION AND AXILLARY SENTINEL LYMPH NODE BX;  Surgeon: Almond Lint, MD;  Location: [facility];  Service: General;  Laterality: Right;   CARDIAC CATHETERIZATION     2 stents   CHOLECYSTECTOMY     COLONOSCOPY     COLONOSCOPY N/A 05/30/2023   Procedure: COLONOSCOPY;  Surgeon: Kathi Der, MD;  Location: [facility];  Service: Gastroenterology;  Laterality: N/A;   ESOPHAGOGASTRODUODENOSCOPY N/A 09/08/2020   Procedure: ESOPHAGOGASTRODUODENOSCOPY (EGD);  Surgeon: Bernette Redbird, MD;  Location: [facility];  Service: Endoscopy;  Laterality: N/A;   ESOPHAGOGASTRODUODENOSCOPY N/A 05/30/2023   Procedure: EGD (ESOPHAGOGASTRODUODENOSCOPY);  Surgeon: Kathi Der, MD;  Location: [facility]  END[facility]COPY;  Service: Gastroenterology;  Laterality: N/A;   HEMICOLECTOMY     R sided hemicolectomy   HERNIA REPAIR     Ventral hernia repair   PTCA  4/06     reports that she has never smoked. She has never used smokeless tobacco. She reports that she does not drink alcohol and does not use drugs.  Allergies  Allergen Reactions   Anesthetics, Halogenated Other (See Comments)    Per patient's daughter, she can not tolerate anesthetics.    Etomidate Other (See Comments)    Heart stopped during anesthesia   Fentanyl Other (See Comments)    Heart stopped during anesthesia    Percocet [Oxycodone-Acetaminophen] Other (See Comments)    Headaches    Diazepam Other (See Comments)    Agitation   Lorazepam Nausea Only   Tramadol Hcl Nausea And Vomiting   Haloperidol Nausea And Vomiting    **Haldol**   Morphine Sulfate Nausea Only    agitation  Propoxyphene Nausea And Vomiting    **Darvocet** no longer available   Versed [Midazolam] Other (See Comments)    Heart stopped during anesthesia    Family History  Problem Relation Age of Onset   Breast cancer Mother 66   Pancreatic cancer Brother 64   Lung cancer Maternal Aunt    Breast cancer Maternal Aunt    Kidney failure Maternal Aunt    Prostate cancer Maternal Uncle    Heart attack Maternal Grandmother    Breast cancer Maternal Grandmother    Colon cancer Maternal Grandmother    Clotting disorder Maternal Grandmother    Diabetes Maternal Grandmother    Hypertension Daughter    Pancreatic cancer Other     Prior to Admission medications   Medication Sig Start Date End Date Taking? Authorizing Provider  acetaminophen (TYLENOL) 650 MG CR tablet Take 1,300 mg by mouth every 8 (eight) hours as needed for pain.    [provider]  amLODipine (NORVASC) 10 MG tablet Take 1 tablet (10 mg total) by mouth daily. 10/26/22     apixaban (ELIQUIS) 2.5 MG TABS tablet Take 1 tablet (2.5 mg total) by mouth 2 (two) times daily. 10/30/21    Arrien, York Ram, MD  colestipol (COLESTID) 1 g tablet Take 1 tablet (1 g total) by mouth 3 (three) times daily. 05/14/23   Deanna Artis, DO  diclofenac Sodium (VOLTAREN) 1 % GEL Apply 1 Application topically 4 (four) times daily as needed (knee pain).    [provider]  diphenoxylate-atropine (LOMOTIL) 2.5-0.025 MG tablet Take 1 tablet by mouth 2 (two) times daily as needed for diarrhea or loose stools. 05/14/23 05/13/24  Deanna Artis, DO  famotidine (PEPCID) 20 MG tablet Take 20 mg by mouth daily as needed for heartburn or indigestion. 10/14/21   [provider]  FLUoxetine (PROZAC) 20 MG capsule Take 20 mg by mouth daily. 05/05/23   [provider]  folic acid (FOLVITE) 1 MG tablet Take 1 tablet (1 mg total) by mouth daily. 05/14/23 05/13/24  Deanna Artis, DO  furosemide (LASIX) 40 MG tablet Take 1 tablet (40 mg total) by mouth daily. 04/09/23     gabapentin (NEURONTIN) 300 MG capsule Take 300 mg by mouth at bedtime. 05/02/23   [provider]  HYDROcodone-acetaminophen (NORCO/VICODIN) 5-325 MG tablet Take 2 tablets by mouth every 6 (six) hours as needed. 05/17/23   Caron Presume, MD  lipase/protease/amylase (CREON) 36000 UNITS CPEP capsule Take 1 capsule (36,000 Units total) by mouth 3 (three) times daily before meals. 05/14/23   Deanna Artis, DO  loperamide (IMODIUM) 2 MG capsule Take 1 capsule (2 mg total) by mouth as needed for diarrhea or loose stools. 05/14/23   Deanna Artis, DO  mirtazapine (REMERON) 7.5 MG tablet Take 7.5 mg by mouth at bedtime. 04/22/23   [provider]  nitroGLYCERIN (NITROSTAT) 0.4 MG SL tablet DISSOLVE 1 TABLET UNDER THE TONGUE EVERY 5 MINUTES AS  NEEDED FOR CHEST PAIN. MAX  OF 3 TABLETS IN 15 MINUTES. CALL 911 IF PAIN PERSISTS. 10/04/20   Jake Bathe, MD  pantoprazole (PROTONIX) 40 MG tablet Take 1 tablet (40 mg total) by mouth 2 (two) times daily. Patient taking differently: Take 40 mg by mouth daily.  12/23/21   Jacalyn Lefevre, MD  rivastigmine (EXELON) 1.5 MG capsule Take 1 capsule (1.5 mg total) by mouth 2 (two) times daily. 12/27/22 07/25/23  Windell Norfolk, MD  sevelamer carbonate (RENVELA) 800 MG tablet Take  1 tablet (800 mg total) by mouth 3 (three) times daily with meals. 06/01/23 07/01/23  Lanae Boast, MD  sodium bicarbonate 650 MG tablet Take 1 tablet (650 mg total) by mouth 2 (two) times daily. 06/01/23 07/01/23  Lanae Boast, MD  tiZANidine (ZANAFLEX) 2 MG tablet Take 2 mg by mouth at bedtime as needed. 04/09/23   [provider]    Physical Exam: Vitals:   06/15/23 1845 06/15/23 1850 06/15/23 2000 06/15/23 2030  BP: (!) 160/70 (!) 153/63 (!) 164/77   Pulse: 94 89 88   Resp: 17  15   Temp: 98.2 F (36.8 C) 98.5 F (36.9 C)    SpO2: 97% 100% 100%   Weight:    79 kg  Height:    5\' 4"  (1.626 m)    Physical Exam Vitals reviewed.  Constitutional:      General: She is not in acute distress. HENT:     Head: Normocephalic and atraumatic.  Eyes:     Extraocular Movements: Extraocular movements intact.  Cardiovascular:     Rate and Rhythm: Normal rate and regular rhythm.     Pulses: Normal pulses.  Pulmonary:     Effort: Pulmonary effort is normal. No respiratory distress.     Breath sounds: Normal breath sounds. No wheezing or rales.  Abdominal:     General: Bowel sounds are normal. There is no distension.     Palpations: Abdomen is soft.     Tenderness: There is no abdominal tenderness. There is no guarding.  Musculoskeletal:     Cervical back: Normal range of motion.     Right lower leg: Edema present.     Left lower leg: Edema present.     Comments: Pitting edema of bilateral lower extremities extending up to thighs  Skin:    General: Skin is warm and dry.  Neurological:     General: No focal deficit present.     Mental Status: She is alert and oriented to person, place, and time.     Labs on Admission: I have personally reviewed following labs and imaging  studies  CBC: Recent Labs  Lab 06/15/23 1858  WBC 11.4*  HGB 7.0*  HCT 24.1*  MCV 96.4  PLT 192   Basic Metabolic Panel: Recent Labs  Lab 06/15/23 1858  NA 140  K 5.0  CL 109  CO2 20*  GLUCOSE 147*  BUN 82*  CREATININE 4.08*  CALCIUM 8.6*   GFR: Estimated Creatinine Clearance: 12.1 mL/min (A) (by C-G formula based on SCr of 4.08 mg/dL (H)). Liver Function Tests: No results for input(s): "AST", "ALT", "ALKPHOS", "BILITOT", "PROT", "ALBUMIN" in the last 168 hours. No results for input(s): "LIPASE", "AMYLASE" in the last 168 hours. No results for input(s): "AMMONIA" in the last 168 hours. Coagulation Profile: No results for input(s): "INR", "PROTIME" in the last 168 hours. Cardiac Enzymes: No results for input(s): "CKTOTAL", "CKMB", "CKMBINDEX", "TROPONINI" in the last 168 hours. BNP (last 3 results) No results for input(s): "PROBNP" in the last 8760 hours. HbA1C: No results for input(s): "HGBA1C" in the last 72 hours. CBG: No results for input(s): "GLUCAP" in the last 168 hours. Lipid Profile: No results for input(s): "CHOL", "HDL", "LDLCALC", "TRIG", "CHOLHDL", "LDLDIRECT" in the last 72 hours. Thyroid Function Tests: No results for input(s): "TSH", "T4TOTAL", "FREET4", "T3FREE", "THYROIDAB" in the last 72 hours. Anemia Panel: No results for input(s): "VITAMINB12", "FOLATE", "FERRITIN", "TIBC", "IRON", "RETICCTPCT" in the last 72 hours. Urine analysis:    Component Value Date/Time  COLORURINE STRAW (A) 05/26/2023 1610   APPEARANCEUR CLEAR 05/26/2023 1610   APPEARANCEUR Clear 08/22/2018 1620   LABSPEC 1.008 05/26/2023 1610   LABSPEC 1.020 03/03/2014 1341   PHURINE 5.0 05/26/2023 1610   GLUCOSEU NEGATIVE 05/26/2023 1610   GLUCOSEU Negative 03/03/2014 1341   HGBUR SMALL (A) 05/26/2023 1610   BILIRUBINUR NEGATIVE 05/26/2023 1610   BILIRUBINUR Negative 08/22/2018 1620   BILIRUBINUR Negative 03/03/2014 1341   KETONESUR NEGATIVE 05/26/2023 1610   PROTEINUR 100  (A) 05/26/2023 1610   UROBILINOGEN 0.2 03/03/2014 1341   NITRITE NEGATIVE 05/26/2023 1610   LEUKOCYTESUR NEGATIVE 05/26/2023 1610   LEUKOCYTESUR Negative 03/03/2014 1341    Radiological Exams on Admission: DG Chest 2 View Result Date: 06/15/2023 CLINICAL DATA:  Dyspnea on exertion. EXAM: CHEST - 2 VIEW COMPARISON:  May 25, 2023. FINDINGS: Stable cardiomediastinal silhouette. Both lungs are clear. The visualized skeletal structures are unremarkable. IMPRESSION: No active cardiopulmonary disease. Electronically Signed   By: Lupita Raider M.D.   On: 06/15/2023 19:23    EKG: Independently reviewed.  Sinus rhythm, LAFB, RBBB.  No significant change compared to previous tracings.  Assessment and Plan  Volume overload Acute on chronic HFpEF CKD stage V Patient is presenting with complaints of progressively worsening dyspnea on exertion, bilateral lower extremity edema, and weight gain x 2 weeks.  BNP 560.  Creatinine 4.0, stable.  Chest x-ray not suggestive of pulmonary edema.  PE less likely to be an etiology for dyspnea on exertion given no tachycardia or hypoxia and she is on Eliquis.  Recent echo done 05/26/2023 showing EF 60 to 65%, grade 1 diastolic dysfunction, and mild mitral regurgitation.  Patient was given IV Lasix 40 mg in the ED.  Monitor intake and output, daily weights, and renal function.  Low-sodium diet with fluid restriction.  Consult cardiology and nephrology in the morning.  Acute on chronic normocytic anemia Anemia of chronic kidney disease History of folate and iron deficiency Seen by GI during recent admission and underwent EGD and colonoscopy which did not show evidence of active bleeding.  Hemoglobin currently 7.0 and was 7.7 on 3/15.  Baseline hemoglobin appears to be 8-10.  Patient is no longer having bloody stools.  She denies hematemesis, hematochezia, melena, or abdominal pain.  No other obvious bleeding.  Type and screen, continue to monitor H&H.  Transfuse PRBCs if  hemoglobin less than 7.  Continue folate supplement.  Chest pain CAD status post PCI in 2000 EKG without acute ischemic changes.  Stat troponin x 2.  Hypertension Most recent systolic in the 130s.  Continue amlodipine.  History of renal vein thrombosis 09/2017 and DVT 06/2020 Hold dose of Eliquis tonight given worsening anemia.  Repeat CBC in the morning and if hemoglobin stable then resume Eliquis.  Depression/memory loss Continue home meds.  Type 2 diabetes A1c 6.2 on 05/08/2023.  Placed on very sensitive sliding scale insulin ACHS.  Chronic pain/neuropathy Continue gabapentin.  GERD/gastritis Continue Protonix.  Code Status: Full Code (discussed with the patient and her family) Family Communication: Daughter and granddaughter at bedside. Level of care: Telemetry bed Admission status: It is my clinical opinion that referral for OBSERVATION is reasonable and necessary in this patient based on the above information provided. The aforementioned taken together are felt to place the patient at high risk for further clinical deterioration. However, it is anticipated that the patient may be medically stable for discharge from the hospital within 24 to 48 hours.  John Giovanni MD [facility]  If  7PM-7AM, please contact night-coverage [email or web address]  06/15/2023, 8:43 PM

## 2023-06-15 NOTE — ED Notes (Signed)
 Patient taken to [facility] by ED tech on portable cardiac monitoring.

## 2023-06-15 NOTE — ED Provider Notes (Signed)
 [place] EMERGENCY DEPARTMENT AT [facility] Provider Note   CSN: [id number] Arrival date & time: 06/15/23  1842     History  Chief Complaint  Patient presents with   Shortness of Breath   Leg Swelling    [name] is a 78 y.o. female.  78 yo F with a cc of leg swelling and sob.  Family said she had about a 25 pound weight gain over the past couple weeks.  Was just in the hospital.  Having worsening difficulty breathing especially on exertion.  No chest pain or pressure.  No cough congestion or fever.   Shortness of Breath      Home Medications Prior to Admission medications   Medication Sig Start Date End Date Taking? Authorizing Provider  acetaminophen (TYLENOL) 650 MG CR tablet Take 1,300 mg by mouth every 8 (eight) hours as needed for pain.   Yes [provider]  amLODipine (NORVASC) 10 MG tablet Take 1 tablet (10 mg total) by mouth daily. 10/26/22  Yes   apixaban (ELIQUIS) 2.5 MG TABS tablet Take 1 tablet (2.5 mg total) by mouth 2 (two) times daily. 10/30/21  Yes Arrien, York Ram, MD  colestipol (COLESTID) 1 g tablet Take 1 tablet (1 g total) by mouth 3 (three) times daily. 05/14/23  Yes Deanna Artis, DO  diphenoxylate-atropine (LOMOTIL) 2.5-0.025 MG tablet Take 1 tablet by mouth 2 (two) times daily as needed for diarrhea or loose stools. 05/14/23 05/13/24 Yes Deanna Artis, DO  famotidine (PEPCID) 20 MG tablet Take 20 mg by mouth daily as needed for heartburn or indigestion. 10/14/21  Yes [provider]  FLUoxetine (PROZAC) 20 MG capsule Take 20 mg by mouth daily. 05/05/23  Yes [provider]  folic acid (FOLVITE) 1 MG tablet Take 1 tablet (1 mg total) by mouth daily. 05/14/23 05/13/24 Yes Deanna Artis, DO  furosemide (LASIX) 40 MG tablet Take 1 tablet (40 mg total) by mouth daily. Patient taking differently: Take 40-80 mg by mouth See admin instructions. Take 1 tablet by mouth daily and may take an additional  tablet for excess swelling 04/09/23  Yes   gabapentin (NEURONTIN) 300 MG capsule Take 300 mg by mouth at bedtime. 05/02/23  Yes [provider]  HYDROcodone-acetaminophen (NORCO/VICODIN) 5-325 MG tablet Take 2 tablets by mouth every 6 (six) hours as needed. 05/17/23  Yes Caron Presume, MD  lipase/protease/amylase (CREON) 36000 UNITS CPEP capsule Take 1 capsule (36,000 Units total) by mouth 3 (three) times daily before meals. 05/14/23  Yes Deanna Artis, DO  loperamide (IMODIUM) 2 MG capsule Take 1 capsule (2 mg total) by mouth as needed for diarrhea or loose stools. 05/14/23  Yes Deanna Artis, DO  mirtazapine (REMERON) 7.5 MG tablet Take 7.5 mg by mouth at bedtime. 04/22/23  Yes [provider]  pantoprazole (PROTONIX) 40 MG tablet Take 1 tablet (40 mg total) by mouth 2 (two) times daily. Patient taking differently: Take 40 mg by mouth daily. 12/23/21  Yes Jacalyn Lefevre, MD  rivastigmine (EXELON) 1.5 MG capsule Take 1 capsule (1.5 mg total) by mouth 2 (two) times daily. 12/27/22 07/25/23 Yes Windell Norfolk, MD  sevelamer carbonate (RENVELA) 800 MG tablet Take 1 tablet (800 mg total) by mouth 3 (three) times daily with meals. 06/01/23 07/01/23 Yes Lanae Boast, MD  sodium bicarbonate 650 MG tablet Take 1 tablet (650 mg total) by mouth 2 (two) times daily. 06/01/23 07/01/23 Yes Lanae Boast, MD  tiZANidine (ZANAFLEX) 2  MG tablet Take 2 mg by mouth at bedtime as needed for muscle spasms. 04/09/23  Yes [provider]  nitroGLYCERIN (NITROSTAT) 0.4 MG SL tablet DISSOLVE 1 TABLET UNDER THE TONGUE EVERY 5 MINUTES AS  NEEDED FOR CHEST PAIN. MAX  OF 3 TABLETS IN 15 MINUTES. CALL 911 IF PAIN PERSISTS. Patient not taking: Reported on 06/15/2023 10/04/20   Jake Bathe, MD      Allergies    Anesthetics, halogenated; Etomidate; Fentanyl; Percocet [oxycodone-acetaminophen]; Diazepam; Lorazepam; Tramadol hcl; Haloperidol; Morphine sulfate; Propoxyphene; and Versed [midazolam]    Review of  Systems   Review of Systems  Respiratory:  Positive for shortness of breath.     Physical Exam Updated Vital Signs BP (!) 132/99 (BP Location: Left Arm)   Pulse 89   Temp 98.7 F (37.1 C) (Oral)   Resp 16   Ht 5\' 4"  (1.626 m)   Wt 80.5 kg   SpO2 98%   BMI 30.45 kg/m  Physical Exam Vitals and nursing note reviewed.  Constitutional:      General: She is not in acute distress.    Appearance: She is well-developed. She is not diaphoretic.  HENT:     Head: Normocephalic and atraumatic.  Eyes:     Pupils: Pupils are equal, round, and reactive to light.  Cardiovascular:     Rate and Rhythm: Normal rate and regular rhythm.     Heart sounds: No murmur heard.    No friction rub. No gallop.  Pulmonary:     Effort: Pulmonary effort is normal.     Breath sounds: No wheezing or rales.  Abdominal:     General: There is no distension.     Palpations: Abdomen is soft.     Tenderness: There is no abdominal tenderness.  Musculoskeletal:        General: No tenderness.     Cervical back: Normal range of motion and neck supple.     Right lower leg: Edema present.     Left lower leg: Edema present.     Comments: Lower extremity edema 3+ to the thighs bilaterally  Skin:    General: Skin is warm and dry.  Neurological:     Mental Status: She is alert and oriented to person, place, and time.  Psychiatric:        Behavior: Behavior normal.     ED Results / Procedures / Treatments   Labs (all labs ordered are listed, but only abnormal results are displayed) Labs Reviewed  BASIC METABOLIC PANEL WITH GFR - Abnormal; Notable for the following components:      Result Value   CO2 20 (*)    Glucose, Bld 147 (*)    BUN 82 (*)    Creatinine, Ser 4.08 (*)    Calcium 8.6 (*)    GFR, Estimated 11 (*)    All other components within normal limits  CBC - Abnormal; Notable for the following components:   WBC 11.4 (*)    RBC 2.50 (*)    Hemoglobin 7.0 (*)    HCT 24.1 (*)    MCHC 29.0 (*)     RDW 19.9 (*)    All other components within normal limits  BRAIN NATRIURETIC PEPTIDE - Abnormal; Notable for the following components:   B Natriuretic Peptide 560.4 (*)    All other components within normal limits  CBC  BASIC METABOLIC PANEL WITH GFR  GLUCOSE, CAPILLARY  TYPE AND SCREEN  TROPONIN I (HIGH SENSITIVITY)  EKG EKG Interpretation Date/Time:  Saturday June 15 2023 18:52:40 EDT Ventricular Rate:  88 PR Interval:  126 QRS Duration:  148 QT Interval:  390 QTC Calculation: 471 R Axis:   -47  Text Interpretation: Normal sinus rhythm Right bundle branch block Left anterior fascicular block Bifascicular block Abnormal ECG No significant change since last tracing Confirmed by Melene Plan [phone] on 06/15/2023 7:46:08 PM  Radiology DG Chest 2 View Result Date: 06/15/2023 CLINICAL DATA:  Dyspnea on exertion. EXAM: CHEST - 2 VIEW COMPARISON:  May 25, 2023. FINDINGS: Stable cardiomediastinal silhouette. Both lungs are clear. The visualized skeletal structures are unremarkable. IMPRESSION: No active cardiopulmonary disease. Electronically Signed   By: Lupita Raider M.D.   On: 06/15/2023 19:23    Procedures Procedures    Medications Ordered in ED Medications  insulin aspart (novoLOG) injection 0-6 Units (has no administration in time range)  insulin aspart (novoLOG) injection 0-5 Units (has no administration in time range)  amLODipine (NORVASC) tablet 10 mg (has no administration in time range)  apixaban (ELIQUIS) tablet 2.5 mg (has no administration in time range)  colestipol (COLESTID) tablet 1 g (has no administration in time range)  FLUoxetine (PROZAC) capsule 20 mg (has no administration in time range)  folic acid (FOLVITE) tablet 1 mg (has no administration in time range)  gabapentin (NEURONTIN) capsule 300 mg (has no administration in time range)  lipase/protease/amylase (CREON) capsule 36,000 Units (has no administration in time range)  mirtazapine (REMERON)  tablet 7.5 mg (has no administration in time range)  pantoprazole (PROTONIX) EC tablet 40 mg (has no administration in time range)  rivastigmine (EXELON) capsule 1.5 mg (has no administration in time range)  sevelamer carbonate (RENVELA) tablet 800 mg (has no administration in time range)  sodium bicarbonate tablet 650 mg (has no administration in time range)  furosemide (LASIX) injection 40 mg (40 mg Intravenous Given 06/15/23 2132)    ED Course/ Medical Decision Making/ A&P                                 Medical Decision Making Amount and/or Complexity of Data Reviewed Labs: ordered. Radiology: ordered.  Risk Prescription drug management. Decision regarding hospitalization.   78 yo F with a chief complaints of lower extremity edema and difficulty breathing.  This is a recurrent issue for her.  Family says this is similar to when she was hospitalized last but may be worse.  Having a lot of trouble getting up and moving around.  20 to 25 pound weight gain over the past week or 2.  Lab work looks largely baseline, chronic renal dysfunction.  BNP slightly higher than last admission.  Chest x-ray independently interpreted by me without significant edema or infiltrate.  Will discuss with medicine for admission.  The patients results and plan were reviewed and discussed.   Any x-rays performed were independently reviewed by myself.   Differential diagnosis were considered with the presenting HPI.  Medications  insulin aspart (novoLOG) injection 0-6 Units (has no administration in time range)  insulin aspart (novoLOG) injection 0-5 Units (has no administration in time range)  amLODipine (NORVASC) tablet 10 mg (has no administration in time range)  apixaban (ELIQUIS) tablet 2.5 mg (has no administration in time range)  colestipol (COLESTID) tablet 1 g (has no administration in time range)  FLUoxetine (PROZAC) capsule 20 mg (has no administration in time range)  folic acid (FOLVITE)  tablet 1  mg (has no administration in time range)  gabapentin (NEURONTIN) capsule 300 mg (has no administration in time range)  lipase/protease/amylase (CREON) capsule 36,000 Units (has no administration in time range)  mirtazapine (REMERON) tablet 7.5 mg (has no administration in time range)  pantoprazole (PROTONIX) EC tablet 40 mg (has no administration in time range)  rivastigmine (EXELON) capsule 1.5 mg (has no administration in time range)  sevelamer carbonate (RENVELA) tablet 800 mg (has no administration in time range)  sodium bicarbonate tablet 650 mg (has no administration in time range)  furosemide (LASIX) injection 40 mg (40 mg Intravenous Given 06/15/23 2132)    Vitals:   06/15/23 2015 06/15/23 2030 06/15/23 2100 06/15/23 2208  BP: (!) 147/65 (!) 148/59 (!) 161/75 (!) 132/99  Pulse: 84 84 82 89  Resp: 19 (!) 21 16 16   Temp:    98.7 F (37.1 C)  TempSrc:    Oral  SpO2: 100% 100% 100% 98%  Weight:  79 kg  80.5 kg  Height:  5\' 4"  (1.626 m)  5\' 4"  (1.626 m)    Final diagnoses:  Acute on chronic systolic congestive heart failure (HCC)    Admission/ observation were discussed with the admitting physician, patient and/or family and they are comfortable with the plan.          Final Clinical Impression(s) / ED Diagnoses Final diagnoses:  Acute on chronic systolic congestive heart failure [facility])    Rx / DC Orders ED Discharge Orders     None         Melene Plan, DO 06/15/23 2213

## 2023-06-15 NOTE — ED Notes (Signed)
 Pt ambulatory to bathroom with assistance.

## 2023-06-15 NOTE — ED Notes (Signed)
 Report called to [facility] tele RN.

## 2023-06-16 DIAGNOSIS — N179 Acute kidney failure, unspecified: Secondary | ICD-10-CM | POA: Diagnosis present

## 2023-06-16 DIAGNOSIS — M329 Systemic lupus erythematosus, unspecified: Secondary | ICD-10-CM | POA: Diagnosis present

## 2023-06-16 DIAGNOSIS — F32A Depression, unspecified: Secondary | ICD-10-CM | POA: Diagnosis present

## 2023-06-16 DIAGNOSIS — T82898A Other specified complication of vascular prosthetic devices, implants and grafts, initial encounter: Secondary | ICD-10-CM | POA: Diagnosis not present

## 2023-06-16 DIAGNOSIS — E872 Acidosis, unspecified: Secondary | ICD-10-CM | POA: Diagnosis present

## 2023-06-16 DIAGNOSIS — Z794 Long term (current) use of insulin: Secondary | ICD-10-CM | POA: Diagnosis not present

## 2023-06-16 DIAGNOSIS — L02414 Cutaneous abscess of left upper limb: Secondary | ICD-10-CM | POA: Diagnosis not present

## 2023-06-16 DIAGNOSIS — N2581 Secondary hyperparathyroidism of renal origin: Secondary | ICD-10-CM | POA: Diagnosis present

## 2023-06-16 DIAGNOSIS — E1122 Type 2 diabetes mellitus with diabetic chronic kidney disease: Secondary | ICD-10-CM | POA: Diagnosis present

## 2023-06-16 DIAGNOSIS — Z170 Estrogen receptor positive status [ER+]: Secondary | ICD-10-CM | POA: Diagnosis not present

## 2023-06-16 DIAGNOSIS — T82848A Pain from vascular prosthetic devices, implants and grafts, initial encounter: Secondary | ICD-10-CM | POA: Diagnosis not present

## 2023-06-16 DIAGNOSIS — D631 Anemia in chronic kidney disease: Secondary | ICD-10-CM | POA: Diagnosis present

## 2023-06-16 DIAGNOSIS — K219 Gastro-esophageal reflux disease without esophagitis: Secondary | ICD-10-CM | POA: Diagnosis present

## 2023-06-16 DIAGNOSIS — T380X5A Adverse effect of glucocorticoids and synthetic analogues, initial encounter: Secondary | ICD-10-CM | POA: Diagnosis present

## 2023-06-16 DIAGNOSIS — Y718 Miscellaneous cardiovascular devices associated with adverse incidents, not elsewhere classified: Secondary | ICD-10-CM | POA: Diagnosis present

## 2023-06-16 DIAGNOSIS — D72829 Elevated white blood cell count, unspecified: Secondary | ICD-10-CM | POA: Diagnosis not present

## 2023-06-16 DIAGNOSIS — I5032 Chronic diastolic (congestive) heart failure: Secondary | ICD-10-CM | POA: Diagnosis present

## 2023-06-16 DIAGNOSIS — M00861 Arthritis due to other bacteria, right knee: Secondary | ICD-10-CM | POA: Diagnosis not present

## 2023-06-16 DIAGNOSIS — G8929 Other chronic pain: Secondary | ICD-10-CM | POA: Diagnosis present

## 2023-06-16 DIAGNOSIS — L89626 Pressure-induced deep tissue damage of left heel: Secondary | ICD-10-CM | POA: Diagnosis not present

## 2023-06-16 DIAGNOSIS — I2510 Atherosclerotic heart disease of native coronary artery without angina pectoris: Secondary | ICD-10-CM | POA: Diagnosis not present

## 2023-06-16 DIAGNOSIS — D513 Other dietary vitamin B12 deficiency anemia: Secondary | ICD-10-CM | POA: Diagnosis present

## 2023-06-16 DIAGNOSIS — E1165 Type 2 diabetes mellitus with hyperglycemia: Secondary | ICD-10-CM | POA: Diagnosis present

## 2023-06-16 DIAGNOSIS — I5033 Acute on chronic diastolic (congestive) heart failure: Secondary | ICD-10-CM | POA: Diagnosis not present

## 2023-06-16 DIAGNOSIS — E440 Moderate protein-calorie malnutrition: Secondary | ICD-10-CM | POA: Diagnosis present

## 2023-06-16 DIAGNOSIS — I509 Heart failure, unspecified: Secondary | ICD-10-CM | POA: Diagnosis not present

## 2023-06-16 DIAGNOSIS — K5901 Slow transit constipation: Secondary | ICD-10-CM | POA: Diagnosis not present

## 2023-06-16 DIAGNOSIS — I10 Essential (primary) hypertension: Secondary | ICD-10-CM | POA: Diagnosis not present

## 2023-06-16 DIAGNOSIS — C50311 Malignant neoplasm of lower-inner quadrant of right female breast: Secondary | ICD-10-CM | POA: Diagnosis not present

## 2023-06-16 DIAGNOSIS — M11261 Other chondrocalcinosis, right knee: Secondary | ICD-10-CM | POA: Diagnosis not present

## 2023-06-16 DIAGNOSIS — R197 Diarrhea, unspecified: Secondary | ICD-10-CM | POA: Diagnosis not present

## 2023-06-16 DIAGNOSIS — T8453XA Infection and inflammatory reaction due to internal right knee prosthesis, initial encounter: Secondary | ICD-10-CM | POA: Diagnosis not present

## 2023-06-16 DIAGNOSIS — N185 Chronic kidney disease, stage 5: Secondary | ICD-10-CM | POA: Diagnosis not present

## 2023-06-16 DIAGNOSIS — I4891 Unspecified atrial fibrillation: Secondary | ICD-10-CM | POA: Diagnosis present

## 2023-06-16 DIAGNOSIS — M48062 Spinal stenosis, lumbar region with neurogenic claudication: Secondary | ICD-10-CM | POA: Diagnosis not present

## 2023-06-16 DIAGNOSIS — K2901 Acute gastritis with bleeding: Secondary | ICD-10-CM | POA: Diagnosis not present

## 2023-06-16 DIAGNOSIS — I2489 Other forms of acute ischemic heart disease: Secondary | ICD-10-CM | POA: Diagnosis present

## 2023-06-16 DIAGNOSIS — E119 Type 2 diabetes mellitus without complications: Secondary | ICD-10-CM | POA: Diagnosis not present

## 2023-06-16 DIAGNOSIS — I120 Hypertensive chronic kidney disease with stage 5 chronic kidney disease or end stage renal disease: Secondary | ICD-10-CM | POA: Diagnosis not present

## 2023-06-16 DIAGNOSIS — K50911 Crohn's disease, unspecified, with rectal bleeding: Secondary | ICD-10-CM | POA: Diagnosis present

## 2023-06-16 DIAGNOSIS — I2583 Coronary atherosclerosis due to lipid rich plaque: Secondary | ICD-10-CM | POA: Diagnosis not present

## 2023-06-16 DIAGNOSIS — Z8616 Personal history of COVID-19: Secondary | ICD-10-CM | POA: Diagnosis not present

## 2023-06-16 DIAGNOSIS — M109 Gout, unspecified: Secondary | ICD-10-CM | POA: Diagnosis not present

## 2023-06-16 DIAGNOSIS — B9689 Other specified bacterial agents as the cause of diseases classified elsewhere: Secondary | ICD-10-CM | POA: Diagnosis not present

## 2023-06-16 DIAGNOSIS — I5043 Acute on chronic combined systolic (congestive) and diastolic (congestive) heart failure: Secondary | ICD-10-CM | POA: Diagnosis present

## 2023-06-16 DIAGNOSIS — I132 Hypertensive heart and chronic kidney disease with heart failure and with stage 5 chronic kidney disease, or end stage renal disease: Secondary | ICD-10-CM | POA: Diagnosis present

## 2023-06-16 DIAGNOSIS — L03114 Cellulitis of left upper limb: Secondary | ICD-10-CM | POA: Diagnosis present

## 2023-06-16 DIAGNOSIS — G458 Other transient cerebral ischemic attacks and related syndromes: Secondary | ICD-10-CM | POA: Diagnosis not present

## 2023-06-16 DIAGNOSIS — D509 Iron deficiency anemia, unspecified: Secondary | ICD-10-CM | POA: Diagnosis present

## 2023-06-16 DIAGNOSIS — D649 Anemia, unspecified: Secondary | ICD-10-CM | POA: Diagnosis not present

## 2023-06-16 DIAGNOSIS — E8779 Other fluid overload: Secondary | ICD-10-CM | POA: Diagnosis not present

## 2023-06-16 DIAGNOSIS — I452 Bifascicular block: Secondary | ICD-10-CM | POA: Diagnosis present

## 2023-06-16 DIAGNOSIS — M4802 Spinal stenosis, cervical region: Secondary | ICD-10-CM | POA: Diagnosis not present

## 2023-06-16 DIAGNOSIS — R5381 Other malaise: Secondary | ICD-10-CM | POA: Diagnosis not present

## 2023-06-16 DIAGNOSIS — F0390 Unspecified dementia without behavioral disturbance: Secondary | ICD-10-CM | POA: Diagnosis present

## 2023-06-16 DIAGNOSIS — Y832 Surgical operation with anastomosis, bypass or graft as the cause of abnormal reaction of the patient, or of later complication, without mention of misadventure at the time of the procedure: Secondary | ICD-10-CM | POA: Diagnosis not present

## 2023-06-16 DIAGNOSIS — N186 End stage renal disease: Secondary | ICD-10-CM | POA: Diagnosis present

## 2023-06-16 DIAGNOSIS — Z992 Dependence on renal dialysis: Secondary | ICD-10-CM | POA: Diagnosis not present

## 2023-06-16 DIAGNOSIS — E1140 Type 2 diabetes mellitus with diabetic neuropathy, unspecified: Secondary | ICD-10-CM | POA: Diagnosis present

## 2023-06-16 DIAGNOSIS — I110 Hypertensive heart disease with heart failure: Secondary | ICD-10-CM | POA: Diagnosis not present

## 2023-06-16 DIAGNOSIS — R0600 Dyspnea, unspecified: Secondary | ICD-10-CM | POA: Diagnosis not present

## 2023-06-16 DIAGNOSIS — F03A3 Unspecified dementia, mild, with mood disturbance: Secondary | ICD-10-CM | POA: Diagnosis present

## 2023-06-16 DIAGNOSIS — M009 Pyogenic arthritis, unspecified: Secondary | ICD-10-CM | POA: Diagnosis present

## 2023-06-16 DIAGNOSIS — M11262 Other chondrocalcinosis, left knee: Secondary | ICD-10-CM | POA: Diagnosis not present

## 2023-06-16 LAB — CBC
HCT: 21.1 % — ABNORMAL LOW (ref 36.0–46.0)
HCT: 28.4 % — ABNORMAL LOW (ref 36.0–46.0)
Hemoglobin: 6.4 g/dL — CL (ref 12.0–15.0)
Hemoglobin: 9 g/dL — ABNORMAL LOW (ref 12.0–15.0)
MCH: 28.2 pg (ref 26.0–34.0)
MCH: 28.3 pg (ref 26.0–34.0)
MCHC: 30.3 g/dL (ref 30.0–36.0)
MCHC: 31.7 g/dL (ref 30.0–36.0)
MCV: 93 fL (ref 80.0–100.0)
MCV: 89.3 fL (ref 80.0–100.0)
Platelets: 181 10*3/uL (ref 150–400)
Platelets: 192 10*3/uL (ref 150–400)
RBC: 2.27 MIL/uL — ABNORMAL LOW (ref 3.87–5.11)
RBC: 3.18 MIL/uL — ABNORMAL LOW (ref 3.87–5.11)
RDW: 19.8 % — ABNORMAL HIGH (ref 11.5–15.5)
RDW: 19.7 % — ABNORMAL HIGH (ref 11.5–15.5)
WBC: 10.5 10*3/uL (ref 4.0–10.5)
WBC: 11.9 10*3/uL — ABNORMAL HIGH (ref 4.0–10.5)
nRBC: 0 % (ref 0.0–0.2)
nRBC: 0 % (ref 0.0–0.2)

## 2023-06-16 LAB — BASIC METABOLIC PANEL WITH GFR
Anion gap: 7 (ref 5–15)
BUN: 81 mg/dL — ABNORMAL HIGH (ref 8–23)
CO2: 24 mmol/L (ref 22–32)
Calcium: 8.4 mg/dL — ABNORMAL LOW (ref 8.9–10.3)
Chloride: 110 mmol/L (ref 98–111)
Creatinine, Ser: 3.88 mg/dL — ABNORMAL HIGH (ref 0.44–1.00)
GFR, Estimated: 12 mL/min — ABNORMAL LOW (ref >60)
Glucose, Bld: 110 mg/dL — ABNORMAL HIGH (ref 70–99)
Potassium: 4.6 mmol/L (ref 3.5–5.1)
Sodium: 141 mmol/L (ref 135–145)

## 2023-06-16 LAB — GLUCOSE, CAPILLARY
Glucose-Capillary: 81 mg/dL (ref 70–99)
Glucose-Capillary: 76 mg/dL (ref 70–99)
Glucose-Capillary: 115 mg/dL — ABNORMAL HIGH (ref 70–99)
Glucose-Capillary: 161 mg/dL — ABNORMAL HIGH (ref 70–99)

## 2023-06-16 LAB — PREPARE RBC (CROSSMATCH)

## 2023-06-16 LAB — TROPONIN I (HIGH SENSITIVITY): Troponin I (High Sensitivity): 117 ng/L (ref <18)

## 2023-06-16 MED ORDER — SODIUM CHLORIDE 0.9% IV SOLUTION: Freq: Once | INTRAVENOUS | Status: AC

## 2023-06-16 MED ORDER — FUROSEMIDE 10 MG/ML IJ SOLN
80.0000 mg | Freq: Three times a day (TID) | INTRAMUSCULAR | Status: AC
  Administered 2023-06-16 – 2023-06-17 (×5): 80 mg via INTRAVENOUS
  Filled 2023-06-16 (×5): qty 8

## 2023-06-16 MED ORDER — ACETAMINOPHEN 325 MG PO TABS
650.0000 mg | ORAL_TABLET | Freq: Four times a day (QID) | ORAL | Status: DC | PRN
  Administered 2023-06-16 – 2023-06-24 (×9): 650 mg via ORAL
  Filled 2023-06-16 (×8): qty 2

## 2023-06-16 MED ORDER — HEPARIN SODIUM (PORCINE) 5000 UNIT/ML IJ SOLN: 5000.0000 [IU] | Freq: Three times a day (TID) | INTRAMUSCULAR | Status: DC

## 2023-06-16 MED ORDER — FUROSEMIDE 10 MG/ML IJ SOLN
80.0000 mg | Freq: Two times a day (BID) | INTRAMUSCULAR | Status: DC
  Administered 2023-06-16: 80 mg via INTRAVENOUS
  Filled 2023-06-16: qty 8

## 2023-06-16 MED ORDER — DARBEPOETIN ALFA 100 MCG/0.5ML IJ SOSY
100.0000 ug | PREFILLED_SYRINGE | Freq: Once | INTRAMUSCULAR | Status: AC
  Administered 2023-06-16: 100 ug via SUBCUTANEOUS
  Filled 2023-06-16: qty 0.5

## 2023-06-16 NOTE — Progress Notes (Signed)
 [facility] NEPHROLOGY PROGRESS NOTE  Assessment/ Plan: Pt is a 78 y.o. yo female  with history of hypertension, renal vein thrombosis on Eliquis, stroke, type II DM, lupus, CKD 4-5 who was recently admitted for CHF exacerbation and progressive CKD presented back with worsening dyspnea on exertion and fluid overload.  I had discussed with the patient and her daughter about dialysis during recent hospitalization and made plan to manage medically with diuretics and outpatient follow-up.  Her daughter works at [employer].  # Progressive CKD 5 versus AKI on CKD,: With frequent hospitalization for fluid overload.  She received IV Lasix yesterday with 1.8 L of urine output and creatinine level is trending down.  She has no overt sign or symptoms of uremia.  On examination she has significant peripheral edema therefore I am going to increase furosemide to 80 mg 3 times daily.  Strict ins and out and monitor lab. I have discussed with the patient and her daughter today that she might be close to starting dialysis depending on the response with higher dose of diuretics.  They are reviewed with the plan.   # Metabolic acidosis:Continue sodium bicarbonate.   # CKD-MBD/hyperphosphatemia: Started sevelamer in the recent visit, monitor lab.  Check PTH level.   #Anemia of CKD: Concern about GI bleed, underwent endoscopy t recently with no sign of bleeding.  Back on Eliquis.  Receiving blood transfusion.  Recent iron saturation 35%.  I will order an urinalysis.  Repeat iron level in the morning.     # Hypertension/volume: Close to euvolemia now.  BP elevated.  Resume low-dose of amlodipine and volume management with diuretics.  We will continue to follow.  Subjective: Seen and examined at bedside.  Patient is well-known to me from recent hospitalization.  She reports dyspnea on exertion and concern about lower extremity edema.  Denies nausea, vomiting, dysgeusia, chest pain or shortness of breath.  Her  daughter was presented with her. Objective Vital signs in last 24 hours: Vitals:   06/16/23 0600 06/16/23 0720 06/16/23 0848 06/16/23 1049  BP: (!) 155/60 (!) 159/62 (!) 170/63 (!) 156/64  Pulse: 73 75  77  Resp: 14 14 18 16   Temp: 98.4 F (36.9 C) 98.4 F (36.9 C) 98.3 F (36.8 C) 98.1 F (36.7 C)  TempSrc: Oral Oral Oral Oral  SpO2: 94% 95% 100% 100%  Weight:      Height:       Weight change:   Intake/Output Summary (Last 24 hours) at 06/16/2023 1319 Last data filed at 06/16/2023 1302 Gross per 24 hour  Intake 732.33 ml  Output 1580 ml  Net -847.67 ml       Labs: RENAL PANEL Recent Labs  Lab 06/15/23 1858 06/16/23 0301  NA 140 141  K 5.0 4.6  CL 109 110  CO2 20* 24  GLUCOSE 147* 110*  BUN 82* 81*  CREATININE 4.08* 3.88*  CALCIUM 8.6* 8.4*    Liver Function Tests: No results for input(s): "AST", "ALT", "ALKPHOS", "BILITOT", "PROT", "ALBUMIN" in the last 168 hours. No results for input(s): "LIPASE", "AMYLASE" in the last 168 hours. No results for input(s): "AMMONIA" in the last 168 hours. CBC: Recent Labs    01/03/23 1308 01/17/23 1238 01/31/23 1437 02/13/23 1332 02/28/23 1411 03/14/23 1450 04/04/23 1445 04/04/23 1446 04/18/23 1433 04/18/23 1434 05/08/23 1833 05/13/23 0358 05/13/23 0535 05/14/23 0802 05/28/23 0459 05/29/23 0735 05/30/23 0404 05/31/23 0501 06/01/23 0855 06/15/23 1858 06/16/23 0301  HGB 11.0*   < > 10.2*   < >  9.9*   < > 9.9*  --   --  9.4*   < >  --   --    < > 7.9*   < > 7.4* 7.1* 7.7* 7.0* 6.4*  MCV 88.7   < > 87.7   < > 85.2   < > 85.9  --   --  86.4   < >  --   --    < > 87.1   < > 86.9 89.2 90.4 96.4 93.0  VITAMINB12 1,004*  --  1,098*  --  833  --  700  --   --  835  --  1,040*  --   --   --   --   --   --   --   --   --   FOLATE  --   --   --   --   --   --   --   --   --   --   --   --  5.3*  --   --   --   --   --   --   --   --   FERRITIN 61  --   --   --   --   --   --  153 226  --   --  220  --   --  246  --   --    --   --   --   --   TIBC  --   --   --   --   --   --   --   --   --   --   --  206*  --   --  207*  --   --   --   --   --   --   IRON  --   --   --   --   --   --   --   --   --   --   --  46  --   --  73  --   --   --   --   --   --   RETICCTPCT  --   --   --   --   --   --   --   --   --   --   --  1.5  --   --   --   --   --   --   --   --   --    < > = values in this interval not displayed.    Cardiac Enzymes: No results for input(s): "CKTOTAL", "CKMB", "CKMBINDEX", "TROPONINI" in the last 168 hours. CBG: Recent Labs  Lab 06/15/23 2206 06/16/23 0538 06/16/23 1051  GLUCAP 109* 81 76    Iron Studies: No results for input(s): "IRON", "TIBC", "TRANSFERRIN", "FERRITIN" in the last 72 hours. Studies/Results: DG Chest 2 View Result Date: 06/15/2023 CLINICAL DATA:  Dyspnea on exertion. EXAM: CHEST - 2 VIEW COMPARISON:  May 25, 2023. FINDINGS: Stable cardiomediastinal silhouette. Both lungs are clear. The visualized skeletal structures are unremarkable. IMPRESSION: No active cardiopulmonary disease. Electronically Signed   By: Lupita Raider M.D.   On: 06/15/2023 19:23    Medications: Infusions:   Scheduled Medications:  amLODipine  10 mg Oral Daily   colestipol  1 g Oral TID   FLUoxetine  20 mg Oral Daily   folic acid  1 mg Oral Daily   furosemide  80 mg Intravenous BID   gabapentin  300 mg Oral QHS   insulin aspart  0-5 Units Subcutaneous QHS   insulin aspart  0-6 Units Subcutaneous TID WC   lipase/protease/amylase  36,000 Units Oral TID AC   mirtazapine  7.5 mg Oral QHS   pantoprazole  40 mg Oral BID   rivastigmine  1.5 mg Oral BID   sevelamer carbonate  800 mg Oral TID WC   sodium bicarbonate  650 mg Oral BID    have reviewed scheduled and prn medications.  Physical Exam: General:NAD, comfortable Heart:RRR, s1s2 nl Lungs:clear b/l, no crackle Abdomen:soft, Non-tender, non-distended Extremities: Bilateral lower extremities pitting edema ++ Neurology: Alert awake  and following commands  Kule Gascoigne Prasad Uri Turnbough 06/16/2023,1:19 PM  LOS: 0 days

## 2023-06-16 NOTE — Progress Notes (Addendum)
 Anemia of chronic disease due to CKD stage V Received a call from patient's RN hemoglobin is 6.4, low hematocrit 31 and normal MCV 93..  Patient's baseline hemoglobin is around 7 to 7.7. Patient has history of CKD stage V. No evidence of hemoptysis, hematemesis and melena. Also patient has history of right renal vein thrombosis and DVT 06/2020 on Eliquis which has been on hold since admission in the setting of low hemoglobin.  -Obtaining type and screen plan to transfuse 1 unit of PRBC. - Continue to monitor H&H and transfuse as needed to keep hemoglobin above 8.    Tereasa Coop, MD [facility] 06/16/2023, 4:21 AM

## 2023-06-16 NOTE — Progress Notes (Addendum)
  Patient's RN informing me that lab called and stating that patient has elevated troponin 125.  Patient is hemodynamically stable and denies any chest pain.  Elevated troponin-secondary to demand ischemia in the context of CHF exacerbation and CKD stage V with poor clearance of troponin Patient has been admitted for volume overload due to acute CHF exacerbation. EKG showing normal sinus rhythm heart rate 88, right bundle branch block and left anterior fascicular block. -Continue to trend troponin.  If patient develops chest pain will obtain repeat EKG.   Update, second troponin 117.  Troponin has been trending down.  Continue monitor for any development of chest pain.  No change of plan as of now.  Tereasa Coop, MD [facility] 06/16/2023, 12:05 AM

## 2023-06-16 NOTE — Progress Notes (Addendum)
 PROGRESS NOTE    [name]  ZOX:[id number] DOB: [birth year] DOA: 06/15/2023 PCP: Renford Dills, MD  77/F with CAD, chronic diastolic CHF, CKD 4, history of DVT, renal vein thrombosis on Eliquis, CVA, depression, memory loss, type 2 diabetes mellitus, history of breast cancer, lupus was admitted 3/8-15 with volume overload, progressive CKD, followed by nephrology, diuresed and discharged home on oral diuretics, also seen by gastroenterology for worsening chronic anemia, EGD and colonoscopy were unremarkable, Eliquis resumed at discharge. -Back in the ED 3/29 with progressive dyspnea on exertion orthopnea lower extremity edema and weight gain. -In the ED hypertensive, otherwise vital signs stable, hemoglobin 7.0, creatinine 4.0, BUN 82, BNP 560, troponin 125, chest x-ray unremarkable   Subjective: -Feels better this morning, breathing is improving  Assessment and Plan:  Acute on chronic HFpEF Progressive CKD stage 4/5 -Recent hospitalization for same  -Echo 3/25 with EF 60-65, grade 1 DD, normal RV  -Increase Lasix to 80 Mg twice daily, GDMT limited by CKD, monitor urine output, daily weights  -Nephrology consult, dialysis discussions have been ongoing since last admission   Anemia of chronic disease -Underwent GI workup few weeks ago which was unremarkable -Baseline hemoglobin 8-10, now trending lower, transfusing PRBC today -Will need Epo per renal   CAD status post PCI in 2000 EKG without acute ischemic changes.  Stable   Hypertension -Continue amlodipine.   History of renal vein thrombosis 09/2017 and DVT 06/2020 -Will hold Eliquis today in the setting of worsening anemia and potential need for dialysis access    Depression/memory loss Continue fluoxetine, rivastigmine   Type 2 diabetes A1c 6.2 on 05/08/2023.  Sensitive sliding scale   Chronic pain/neuropathy Continue gabapentin.   GERD/gastritis Continue Protonix.   Code Status: Full Code  DVT prophylaxis:  Add heparin subcutaneous Code Status: Full code Family Communication: None present Disposition Plan: Home pending above workup  Consultants:    Procedures:   Antimicrobials:    Objective: Vitals:   06/16/23 0545 06/16/23 0600 06/16/23 0720 06/16/23 0848  BP: (!) 145/60 (!) 155/60 (!) 159/62 (!) 170/63  Pulse: 73 73 75   Resp: 17 14 14 18   Temp: 98.7 F (37.1 C) 98.4 F (36.9 C) 98.4 F (36.9 C) 98.3 F (36.8 C)  TempSrc: Oral Oral Oral Oral  SpO2: 94% 94% 95% 100%  Weight:      Height:        Intake/Output Summary (Last 24 hours) at 06/16/2023 0942 Last data filed at 06/16/2023 0851 Gross per 24 hour  Intake 612.33 ml  Output 1580 ml  Net -967.67 ml   Filed Weights   06/15/23 2030 06/15/23 2208 06/16/23 0421  Weight: 79 kg 80.5 kg 80.4 kg    Examination:  General exam: Appears calm and comfortable  ENT: Positive JVD Respiratory system: Rare basilar Rales Cardiovascular system: S1 & S2 heard, RRR.  Abd: nondistended, soft and nontender.Normal bowel sounds heard. Central nervous system: Alert and oriented. No focal neurological deficits. Extremities: 1+ edema Skin: No rashes Psychiatry:  Mood & affect appropriate.     Data Reviewed:   CBC: Recent Labs  Lab 06/15/23 1858 06/16/23 0301  WBC 11.4* 10.5  HGB 7.0* 6.4*  HCT 24.1* 21.1*  MCV 96.4 93.0  PLT 192 181   Basic Metabolic Panel: Recent Labs  Lab 06/15/23 1858 06/16/23 0301  NA 140 141  K 5.0 4.6  CL 109 110  CO2 20* 24  GLUCOSE 147* 110*  BUN 82* 81*  CREATININE 4.08* 3.88*  CALCIUM 8.6* 8.4*   GFR: Estimated Creatinine Clearance: 12.9 mL/min (A) (by C-G formula based on SCr of 3.88 mg/dL (H)). Liver Function Tests: No results for input(s): "AST", "ALT", "ALKPHOS", "BILITOT", "PROT", "ALBUMIN" in the last 168 hours. No results for input(s): "LIPASE", "AMYLASE" in the last 168 hours. No results for input(s): "AMMONIA" in the last 168 hours. Coagulation Profile: No results for  input(s): "INR", "PROTIME" in the last 168 hours. Cardiac Enzymes: No results for input(s): "CKTOTAL", "CKMB", "CKMBINDEX", "TROPONINI" in the last 168 hours. BNP (last 3 results) No results for input(s): "PROBNP" in the last 8760 hours. HbA1C: No results for input(s): "HGBA1C" in the last 72 hours. CBG: Recent Labs  Lab 06/15/23 2206 06/16/23 0538  GLUCAP 109* 81   Lipid Profile: No results for input(s): "CHOL", "HDL", "LDLCALC", "TRIG", "CHOLHDL", "LDLDIRECT" in the last 72 hours. Thyroid Function Tests: No results for input(s): "TSH", "T4TOTAL", "FREET4", "T3FREE", "THYROIDAB" in the last 72 hours. Anemia Panel: No results for input(s): "VITAMINB12", "FOLATE", "FERRITIN", "TIBC", "IRON", "RETICCTPCT" in the last 72 hours. Urine analysis:    Component Value Date/Time   COLORURINE STRAW (A) 05/26/2023 1610   APPEARANCEUR CLEAR 05/26/2023 1610   APPEARANCEUR Clear 08/22/2018 1620   LABSPEC 1.008 05/26/2023 1610   LABSPEC 1.020 03/03/2014 1341   PHURINE 5.0 05/26/2023 1610   GLUCOSEU NEGATIVE 05/26/2023 1610   GLUCOSEU Negative 03/03/2014 1341   HGBUR SMALL (A) 05/26/2023 1610   BILIRUBINUR NEGATIVE 05/26/2023 1610   BILIRUBINUR Negative 08/22/2018 1620   BILIRUBINUR Negative 03/03/2014 1341   KETONESUR NEGATIVE 05/26/2023 1610   PROTEINUR 100 (A) 05/26/2023 1610   UROBILINOGEN 0.2 03/03/2014 1341   NITRITE NEGATIVE 05/26/2023 1610   LEUKOCYTESUR NEGATIVE 05/26/2023 1610   LEUKOCYTESUR Negative 03/03/2014 1341   Sepsis Labs: @LABRCNTIP (procalcitonin:4,lacticidven:4)  )No results found for this or any previous visit (from the past 240 hours).   Radiology Studies: DG Chest 2 View Result Date: 06/15/2023 CLINICAL DATA:  Dyspnea on exertion. EXAM: CHEST - 2 VIEW COMPARISON:  May 25, 2023. FINDINGS: Stable cardiomediastinal silhouette. Both lungs are clear. The visualized skeletal structures are unremarkable. IMPRESSION: No active cardiopulmonary disease. Electronically  Signed   By: Lupita Raider M.D.   On: 06/15/2023 19:23     Scheduled Meds:  amLODipine  10 mg Oral Daily   colestipol  1 g Oral TID   FLUoxetine  20 mg Oral Daily   folic acid  1 mg Oral Daily   furosemide  80 mg Intravenous BID   gabapentin  300 mg Oral QHS   insulin aspart  0-5 Units Subcutaneous QHS   insulin aspart  0-6 Units Subcutaneous TID WC   lipase/protease/amylase  36,000 Units Oral TID AC   mirtazapine  7.5 mg Oral QHS   pantoprazole  40 mg Oral BID   rivastigmine  1.5 mg Oral BID   sevelamer carbonate  800 mg Oral TID WC   sodium bicarbonate  650 mg Oral BID   Continuous Infusions:   LOS: 0 days    Time spent:    Zannie Cove, MD [facility]   06/16/2023, 9:42 AM

## 2023-06-17 DIAGNOSIS — I5033 Acute on chronic diastolic (congestive) heart failure: Secondary | ICD-10-CM | POA: Diagnosis not present

## 2023-06-17 LAB — GLUCOSE, CAPILLARY
Glucose-Capillary: 84 mg/dL (ref 70–99)
Glucose-Capillary: 87 mg/dL (ref 70–99)
Glucose-Capillary: 93 mg/dL (ref 70–99)

## 2023-06-17 LAB — BASIC METABOLIC PANEL WITH GFR
Anion gap: 10 (ref 5–15)
BUN: 81 mg/dL — ABNORMAL HIGH (ref 8–23)
CO2: 25 mmol/L (ref 22–32)
Calcium: 8.8 mg/dL — ABNORMAL LOW (ref 8.9–10.3)
Chloride: 105 mmol/L (ref 98–111)
Creatinine, Ser: 3.85 mg/dL — ABNORMAL HIGH (ref 0.44–1.00)
GFR, Estimated: 12 mL/min — ABNORMAL LOW (ref >60)
Glucose, Bld: 95 mg/dL (ref 70–99)
Potassium: 4.7 mmol/L (ref 3.5–5.1)
Sodium: 140 mmol/L (ref 135–145)

## 2023-06-17 LAB — CBC
HCT: 27.8 % — ABNORMAL LOW (ref 36.0–46.0)
Hemoglobin: 8.7 g/dL — ABNORMAL LOW (ref 12.0–15.0)
MCH: 28 pg (ref 26.0–34.0)
MCHC: 31.3 g/dL (ref 30.0–36.0)
MCV: 89.4 fL (ref 80.0–100.0)
Platelets: 174 10*3/uL (ref 150–400)
RBC: 3.11 MIL/uL — ABNORMAL LOW (ref 3.87–5.11)
RDW: 19.1 % — ABNORMAL HIGH (ref 11.5–15.5)
WBC: 10.7 10*3/uL — ABNORMAL HIGH (ref 4.0–10.5)
nRBC: 0 % (ref 0.0–0.2)

## 2023-06-17 LAB — IRON AND TIBC
Iron: 40 ug/dL (ref 28–170)
Saturation Ratios: 15 % (ref 10.4–31.8)
TIBC: 267 ug/dL (ref 250–450)
UIBC: 227 ug/dL

## 2023-06-17 LAB — FERRITIN: Ferritin: 192 ng/mL (ref 11–307)

## 2023-06-17 LAB — PHOSPHORUS: Phosphorus: 4.1 mg/dL (ref 2.5–4.6)

## 2023-06-17 MED ORDER — SODIUM CHLORIDE 0.9 % IV SOLN
200.0000 mg | Freq: Once | INTRAVENOUS | Status: AC
  Administered 2023-06-17: 200 mg via INTRAVENOUS
  Filled 2023-06-17: qty 10

## 2023-06-17 MED ORDER — TORSEMIDE 20 MG PO TABS
60.0000 mg | ORAL_TABLET | Freq: Two times a day (BID) | ORAL | Status: DC
  Administered 2023-06-18 (×2): 60 mg via ORAL
  Filled 2023-06-17 (×2): qty 3

## 2023-06-17 MED ORDER — POLYETHYLENE GLYCOL 3350 17 G PO PACK
17.0000 g | PACK | Freq: Every day | ORAL | Status: DC
  Administered 2023-06-17 – 2023-07-04 (×7): 17 g via ORAL
  Filled 2023-06-17 (×13): qty 1

## 2023-06-17 MED ORDER — HYDROCODONE-ACETAMINOPHEN 5-325 MG PO TABS
1.0000 | ORAL_TABLET | Freq: Four times a day (QID) | ORAL | Status: DC | PRN
  Administered 2023-06-17 – 2023-06-20 (×4): 1 via ORAL
  Filled 2023-06-17 (×4): qty 1

## 2023-06-17 MED ORDER — TIZANIDINE HCL 4 MG PO TABS
2.0000 mg | ORAL_TABLET | Freq: Three times a day (TID) | ORAL | Status: DC | PRN
  Administered 2023-06-17 – 2023-07-08 (×9): 2 mg via ORAL
  Filled 2023-06-17 (×9): qty 1

## 2023-06-17 MED ORDER — APIXABAN 5 MG PO TABS
5.0000 mg | ORAL_TABLET | Freq: Two times a day (BID) | ORAL | Status: DC
  Administered 2023-06-17 – 2023-06-19 (×5): 5 mg via ORAL
  Filled 2023-06-17: qty 1
  Filled 2023-06-17: qty 2
  Filled 2023-06-17 (×3): qty 1

## 2023-06-17 MED ORDER — IRON SUCROSE 200 MG IVPB - SIMPLE MED
200.0000 mg | Freq: Once | Status: DC
  Filled 2023-06-17: qty 110

## 2023-06-17 NOTE — Consult Note (Signed)
 [facility] Liaison Consult Note   06/17/2023  Kortnie [place] Ziemann 26-May-1945 [id number]  Insurance: [facility]  Gennell [place] Stoneman    MWU:[id number] DOB: 13-May-1945 DOA: 06/15/2023 PCP: Renford Dills with Deboraha Sprang at [place], this provider is listed for the transition of care follow up appointments  and [facility] for Transition of Care calls   [facility] Liaison met patient at bedside at [facility]  Patient was up at the sink do her bathing. No family currently at bedside.  Spoke with patient regarding reason for visit.  She endorses PCP, SDOH intact.  Assisted patient back to recliner and call light within reach.    The patient was screened for 30 day readmission hospitalization with noted extreme high risk score for unplanned readmission risk with 4 ED visits and 3 hospital admissions in 6 months.  Plan: [facility] Liaison will continue to follow progress and disposition to assess for post hospital community care coordination/management needs.  Referral request for community care coordination: Anticipate post hospital follow up for [facility] with [facility] Transition team.   [facility], [facility] does not replace or interfere with any arrangements made by the Inpatient Transition of Care team.   For questions contact:   Charlesetta Shanks, RN, BSN, CCM [place]  [facility], [facility] Liaison Direct Dial: [phone] or secure chat Email: [email or web address]

## 2023-06-17 NOTE — Progress Notes (Signed)
 PROGRESS NOTE    [name]  ZHY:[id number] DOB: 07/29/1945 DOA: 06/15/2023 PCP: Renford Dills, MD  77/F with CAD, chronic diastolic CHF, CKD 4, history of DVT, renal vein thrombosis on Eliquis, CVA, depression, memory loss, type 2 diabetes mellitus, history of breast cancer, lupus was admitted 3/8-15 with volume overload, progressive CKD, followed by nephrology, diuresed and discharged home on oral diuretics, also seen by gastroenterology for worsening chronic anemia, EGD and colonoscopy were unremarkable, Eliquis resumed at discharge. -Back in the ED 3/29 with progressive dyspnea on exertion orthopnea lower extremity edema and weight gain. -In the ED hypertensive, otherwise vital signs stable, hemoglobin 7.0, creatinine 4.0, BUN 82, BNP 560, troponin 125, chest x-ray unremarkable -Admitted, started on IV diuretics, nephrology following  Subjective: -Hurts all over,, some low back pain, still with dyspnea  Assessment and Plan:  Acute on chronic HFpEF Progressive CKD stage 4/5 -Recent hospitalization for same  -Echo 3/25 with EF 60-65, grade 1 DD, normal RV  -Still volume overloaded, 2.3 L negative, may be inaccurate, continue Lasix 80 Mg twice daily, GDMT limited by CKD, monitor urine output, daily weights  -Nephrology consult appreciated, close to needing dialysis but has no acute indications now   Anemia of chronic disease -Underwent GI workup few weeks ago which was unremarkable -Baseline hemoglobin 8-10, lower this admission, transfused 1 unit PRBC 3/30 -Needs Aranesp   CAD status post PCI in 2000 EKG without acute ischemic changes.  Stable   Hypertension -Continue amlodipine.   History of renal vein thrombosis 09/2017 and DVT 06/2020 -Will restart Eliquis   Depression/memory loss Continue fluoxetine, rivastigmine   Type 2 diabetes A1c 6.2 on 05/08/2023.  Sensitive sliding scale   Chronic pain/neuropathy Continue gabapentin.   GERD/gastritis Continue  Protonix.   Code Status: Full Code  DVT prophylaxis: Eliquis Code Status: Full code Family Communication: None present, will update daughter Disposition Plan: Home pending above workup  Consultants:    Procedures:   Antimicrobials:    Objective: Vitals:   06/16/23 2001 06/16/23 2341 06/17/23 0425 06/17/23 0814  BP: (!) 181/78 (!) 162/65 (!) 169/63 131/82  Pulse: 88 72 78   Resp: 18 16 17 19   Temp: 98.4 F (36.9 C) 98.1 F (36.7 C) 98.4 F (36.9 C) 98.5 F (36.9 C)  TempSrc: Oral Oral Oral Oral  SpO2: 99% 96% 95% 100%  Weight:   77.3 kg   Height:        Intake/Output Summary (Last 24 hours) at 06/17/2023 0951 Last data filed at 06/17/2023 0857 Gross per 24 hour  Intake 940 ml  Output 1850 ml  Net -910 ml   Filed Weights   06/15/23 2208 06/16/23 0421 06/17/23 0425  Weight: 80.5 kg 80.4 kg 77.3 kg    Examination:  Gen: Awake, Alert, Oriented X 3,  HEENT: + JVD Lungs: Good air movement bilaterally, CTAB CVS: S1S2/RRR Abd: soft, Non tender, non distended, BS present Extremities: !plus edema Skin: no new rashes on exposed skin     Data Reviewed:   CBC: Recent Labs  Lab 06/15/23 1858 06/16/23 0301 06/16/23 1613 06/17/23 0330  WBC 11.4* 10.5 11.9* 10.7*  HGB 7.0* 6.4* 9.0* 8.7*  HCT 24.1* 21.1* 28.4* 27.8*  MCV 96.4 93.0 89.3 89.4  PLT 192 181 192 174   Basic Metabolic Panel: Recent Labs  Lab 06/15/23 1858 06/16/23 0301 06/17/23 0330  NA 140 141 140  K 5.0 4.6 4.7  CL 109 110 105  CO2 20* 24 25  GLUCOSE 147* 110*  95  BUN 82* 81* 81*  CREATININE 4.08* 3.88* 3.85*  CALCIUM 8.6* 8.4* 8.8*  PHOS  --   --  4.1   GFR: Estimated Creatinine Clearance: 12.7 mL/min (A) (by C-G formula based on SCr of 3.85 mg/dL (H)). Liver Function Tests: No results for input(s): "AST", "ALT", "ALKPHOS", "BILITOT", "PROT", "ALBUMIN" in the last 168 hours. No results for input(s): "LIPASE", "AMYLASE" in the last 168 hours. No results for input(s): "AMMONIA" in  the last 168 hours. Coagulation Profile: No results for input(s): "INR", "PROTIME" in the last 168 hours. Cardiac Enzymes: No results for input(s): "CKTOTAL", "CKMB", "CKMBINDEX", "TROPONINI" in the last 168 hours. BNP (last 3 results) No results for input(s): "PROBNP" in the last 8760 hours. HbA1C: No results for input(s): "HGBA1C" in the last 72 hours. CBG: Recent Labs  Lab 06/16/23 0538 06/16/23 1051 06/16/23 1543 06/16/23 2105 06/17/23 0541  GLUCAP 81 76 115* 161* 84   Lipid Profile: No results for input(s): "CHOL", "HDL", "LDLCALC", "TRIG", "CHOLHDL", "LDLDIRECT" in the last 72 hours. Thyroid Function Tests: No results for input(s): "TSH", "T4TOTAL", "FREET4", "T3FREE", "THYROIDAB" in the last 72 hours. Anemia Panel: Recent Labs    06/17/23 0330  FERRITIN 192  TIBC 267  IRON 40   Urine analysis:    Component Value Date/Time   COLORURINE STRAW (A) 05/26/2023 1610   APPEARANCEUR CLEAR 05/26/2023 1610   APPEARANCEUR Clear 08/22/2018 1620   LABSPEC 1.008 05/26/2023 1610   LABSPEC 1.020 03/03/2014 1341   PHURINE 5.0 05/26/2023 1610   GLUCOSEU NEGATIVE 05/26/2023 1610   GLUCOSEU Negative 03/03/2014 1341   HGBUR SMALL (A) 05/26/2023 1610   BILIRUBINUR NEGATIVE 05/26/2023 1610   BILIRUBINUR Negative 08/22/2018 1620   BILIRUBINUR Negative 03/03/2014 1341   KETONESUR NEGATIVE 05/26/2023 1610   PROTEINUR 100 (A) 05/26/2023 1610   UROBILINOGEN 0.2 03/03/2014 1341   NITRITE NEGATIVE 05/26/2023 1610   LEUKOCYTESUR NEGATIVE 05/26/2023 1610   LEUKOCYTESUR Negative 03/03/2014 1341   Sepsis Labs: @LABRCNTIP (procalcitonin:4,lacticidven:4)  )No results found for this or any previous visit (from the past 240 hours).   Radiology Studies: DG Chest 2 View Result Date: 06/15/2023 CLINICAL DATA:  Dyspnea on exertion. EXAM: CHEST - 2 VIEW COMPARISON:  May 25, 2023. FINDINGS: Stable cardiomediastinal silhouette. Both lungs are clear. The visualized skeletal structures are  unremarkable. IMPRESSION: No active cardiopulmonary disease. Electronically Signed   By: Lupita Raider M.D.   On: 06/15/2023 19:23     Scheduled Meds:  amLODipine  10 mg Oral Daily   colestipol  1 g Oral TID   FLUoxetine  20 mg Oral Daily   folic acid  1 mg Oral Daily   furosemide  80 mg Intravenous Q8H   gabapentin  300 mg Oral QHS   insulin aspart  0-5 Units Subcutaneous QHS   insulin aspart  0-6 Units Subcutaneous TID WC   lipase/protease/amylase  36,000 Units Oral TID AC   mirtazapine  7.5 mg Oral QHS   pantoprazole  40 mg Oral BID   polyethylene glycol  17 g Oral Daily   rivastigmine  1.5 mg Oral BID   sevelamer carbonate  800 mg Oral TID WC   [START ON 06/18/2023] torsemide  60 mg Oral BID   Continuous Infusions:  iron sucrose       LOS: 1 day    Time spent:    Zannie Cove, MD [facility]   06/17/2023, 9:51 AM

## 2023-06-17 NOTE — Progress Notes (Addendum)
 [facility] NEPHROLOGY PROGRESS NOTE  Assessment/ Plan: Pt is a 78 y.o. yo female  with history of hypertension, renal vein thrombosis on Eliquis, stroke, type II DM, lupus, CKD 4-5 who was recently admitted for CHF exacerbation and progressive CKD presented back with worsening dyspnea on exertion and fluid overload.  I had discussed with the patient and her daughter about dialysis during recent hospitalization and made plan to manage medically with diuretics and outpatient follow-up.  Her daughter works at [employer].  # Progressive CKD 5 versus AKI on CKD,: With frequent hospitalization for fluid overload.  She received IV Lasix yesterday with 1.8 L of urine output and creatinine improved some and has plateaued at 3.8.  She has no overt sign or symptoms of uremia.  Will  continue IV diuresis x 1 more day then switch to po - daughter requesting trial of torsemide and po furosemide has been less effective recently - agree.  - Strict ins and out and monitor lab. -I have discussed with the patient and her daughter today that she might be close to starting dialysis but has no indications.  I suspect she will end on with ESRD in 2025 and communicated that to them today -She f/u with Dr Signe Colt outpt and has appt tomorrow - daughter to reschedule for next week.   # Metabolic acidosis:d/c sodium bicarbonate with serum bicarb 25 today   # CKD-MBD/hyperphosphatemia: Started sevelamer in the recent visit, monitor lab.  Check PTH level.   #Anemia of CKD: Concern about GI bleed, underwent endoscopy t recently with no sign of bleeding.  Back on Eliquis.  Hb was 6.4 and now 9 > 8.7 after transfusion.  Recent iron saturation 35% but down to 15% today.  S/p aranesp 100 3/30.  Was receiving ESA outpt through Dr Mosetta Putt but due to repeated admission it's been since 1/30.    # Hypertension/volume: diuresing.  Bp 160s overnight but 131 this AM. Cont current meds.   We will continue to follow.  Subjective: Seen  and examined at bedside.  C/o Low back pain related to bed - not a new issue.  I/Os 0.8 / 1.85 - net neg 2.3L for admission. Discussed with granddaughter in room and daughter on speakerphone.  Objective Vital signs in last 24 hours: Vitals:   06/16/23 2001 06/16/23 2341 06/17/23 0425 06/17/23 0814  BP: (!) 181/78 (!) 162/65 (!) 169/63 131/82  Pulse: 88 72 78   Resp: 18 16 17 19   Temp: 98.4 F (36.9 C) 98.1 F (36.7 C) 98.4 F (36.9 C) 98.5 F (36.9 C)  TempSrc: Oral Oral Oral Oral  SpO2: 99% 96% 95%   Weight:   77.3 kg   Height:       Weight change: -1.7 kg  Intake/Output Summary (Last 24 hours) at 06/17/2023 0840 Last data filed at 06/17/2023 0049 Gross per 24 hour  Intake 832 ml  Output 1850 ml  Net -1018 ml       Labs: RENAL PANEL Recent Labs  Lab 06/15/23 1858 06/16/23 0301 06/17/23 0330  NA 140 141 140  K 5.0 4.6 4.7  CL 109 110 105  CO2 20* 24 25  GLUCOSE 147* 110* 95  BUN 82* 81* 81*  CREATININE 4.08* 3.88* 3.85*  CALCIUM 8.6* 8.4* 8.8*  PHOS  --   --  4.1    Liver Function Tests: No results for input(s): "AST", "ALT", "ALKPHOS", "BILITOT", "PROT", "ALBUMIN" in the last 168 hours. No results for input(s): "LIPASE", "AMYLASE" in  the last 168 hours. No results for input(s): "AMMONIA" in the last 168 hours. CBC: Recent Labs    01/31/23 1437 02/13/23 1332 02/28/23 1411 03/14/23 1450 04/04/23 1445 04/04/23 1446 04/18/23 1433 04/18/23 1434 05/08/23 1833 05/13/23 0358 05/13/23 0535 05/14/23 0802 05/28/23 0459 05/29/23 0735 06/01/23 0855 06/15/23 1858 06/16/23 0301 06/16/23 1613 06/17/23 0330  HGB 10.2*   < > 9.9*   < > 9.9*  --   --  9.4*   < >  --   --    < > 7.9*   < > 7.7* 7.0* 6.4* 9.0* 8.7*  MCV 87.7   < > 85.2   < > 85.9  --   --  86.4   < >  --   --    < > 87.1   < > 90.4 96.4 93.0 89.3 89.4  VITAMINB12 1,098*  --  833  --  700  --   --  835  --  1,040*  --   --   --   --   --   --   --   --   --   FOLATE  --   --   --   --   --   --    --   --   --   --  5.3*  --   --   --   --   --   --   --   --   FERRITIN  --   --   --   --   --  153 226  --   --  220  --   --  246  --   --   --   --   --  192  TIBC  --   --   --   --   --   --   --   --   --  206*  --   --  207*  --   --   --   --   --  267  IRON  --   --   --   --   --   --   --   --   --  46  --   --  73  --   --   --   --   --  40  RETICCTPCT  --   --   --   --   --   --   --   --   --  1.5  --   --   --   --   --   --   --   --   --    < > = values in this interval not displayed.    Cardiac Enzymes: No results for input(s): "CKTOTAL", "CKMB", "CKMBINDEX", "TROPONINI" in the last 168 hours. CBG: Recent Labs  Lab 06/16/23 0538 06/16/23 1051 06/16/23 1543 06/16/23 2105 06/17/23 0541  GLUCAP 81 76 115* 161* 84    Iron Studies:  Recent Labs    06/17/23 0330  IRON 40  TIBC 267  FERRITIN 192   Studies/Results: DG Chest 2 View Result Date: 06/15/2023 CLINICAL DATA:  Dyspnea on exertion. EXAM: CHEST - 2 VIEW COMPARISON:  May 25, 2023. FINDINGS: Stable cardiomediastinal silhouette. Both lungs are clear. The visualized skeletal structures are unremarkable. IMPRESSION: No active cardiopulmonary disease. Electronically Signed   By: Lupita Raider M.D.   On: 06/15/2023 19:23  Medications: Infusions:   Scheduled Medications:  amLODipine  10 mg Oral Daily   colestipol  1 g Oral TID   FLUoxetine  20 mg Oral Daily   folic acid  1 mg Oral Daily   furosemide  80 mg Intravenous Q8H   gabapentin  300 mg Oral QHS   insulin aspart  0-5 Units Subcutaneous QHS   insulin aspart  0-6 Units Subcutaneous TID WC   lipase/protease/amylase  36,000 Units Oral TID AC   mirtazapine  7.5 mg Oral QHS   pantoprazole  40 mg Oral BID   polyethylene glycol  17 g Oral Daily   rivastigmine  1.5 mg Oral BID   sevelamer carbonate  800 mg Oral TID WC   sodium bicarbonate  650 mg Oral BID    have reviewed scheduled and prn medications.  Physical Exam: General:NAD,  comfortable Heart:RRR, s1s2 nl Lungs:clear b/l, no crackle Abdomen:soft, Non-tender, non-distended Extremities: Bilateral lower extremities pitting edema ++  Neurology: Alert awake and following commands  Tyler Pita 06/17/2023,8:40 AM  LOS: 1 day

## 2023-06-17 NOTE — Progress Notes (Signed)
 Mobility Specialist Progress Note:   06/17/23 1137  Mobility  Activity Ambulated with assistance in hallway  Level of Assistance Contact guard assist, steadying assist  Assistive Device Front wheel walker  Distance Ambulated (ft) 500 ft  Activity Response Tolerated well  Mobility Referral Yes  Mobility visit 1 Mobility  Mobility Specialist Start Time (ACUTE ONLY) 1137  Mobility Specialist Stop Time (ACUTE ONLY) 1150  Mobility Specialist Time Calculation (min) (ACUTE ONLY) 13 min   Pt agreeable to mobility session. Required only minG assist at EOS d/t fatigue, mainly supervision throughout. No c/o SOB, endorses BLE swelling. Back in chair with all needs met.   Addison Lank Mobility Specialist Please contact via SecureChat or  Rehab office at [phone]

## 2023-06-17 NOTE — Progress Notes (Signed)
 Heart Failure Navigator Progress Note  Assessed for Heart & Vascular TOC clinic readiness.  Patient does not meet criteria due to EF 60-65%, CKD V- ongoing dialysis discussion , No HF TOC per Dr. Jomarie Longs.   Navigator will sign off at this time.   Rhae Hammock, BSN, Scientist, clinical (histocompatibility and immunogenetics) Only

## 2023-06-17 NOTE — TOC Initial Note (Addendum)
 Transition of Care [facility]) - Initial/Assessment Note    Patient Details  Name: [name] MRN: [id number] Date of Birth: February 06, 1946  [facility]) CM/SW Contact:    Katie Haven, RN Phone Number: 06/17/2023, 12:40 PM  Clinical Narrative:                 From home with cousin, has PCP, [name] and insurance on file, states has  [facility] services in place with [facility] and would like to continue with them.  Has cane and walker  at home.  States [name] , her daughter  will transport them home at [employer] and family is support system, states gets medications from [place] [facility].[name] self ambulatory with cane and walker. Patient gives this NCM permission to speak with her daughter [name] and her grand daughter [name].  Expected Discharge Plan: Home w Home Health Services Barriers to Discharge: Continued Medical Work up   Patient Goals and CMS Choice Patient states their goals for this hospitalization and ongoing recovery are:: return home with [facility] CMS Medicare.gov Compare Post Acute Care list provided to:: Patient Choice offered to / list presented to : Patient      Expected Discharge Plan and Services In-house Referral: NA Discharge Planning Services: CM Consult Post Acute Care Choice: Home Health, Resumption of Svcs/PTA Provider Living arrangements for the past 2 months: Single Family Home                 DME Arranged: N/A DME Agency: NA       HH Arranged: RN, PT, OT, Nurse's Aide, Social Work [employer] Agency: [facility] Date HH Agency Contacted: 06/17/23 Time HH Agency Contacted: 1238 Representative spoke with at [facility] Agency: [name]  Prior Living Arrangements/Services Living arrangements for the past 2 months: Single Family Home Lives with:: Relatives (cousin) Patient language and need for interpreter reviewed:: Yes Do you feel safe going back to the place where you live?: Yes      Need for Family Participation in Patient Care: Yes  (Comment) Care giver support system in place?: Yes (comment) Current home services: Homehealth aide, Home OT, Home PT, Home RN (SW) Criminal Activity/Legal Involvement Pertinent to Current Situation/Hospitalization: No - Comment as needed  Activities of Daily Living      Permission Sought/Granted Permission sought to share information with : Case Manager, Family Supports Permission granted to share information with : Yes, Verbal Permission Granted  Share Information with NAME: [name]  Permission granted to share info w AGENCY: HH  Permission granted to share info w Relationship: Daughter  Permission granted to share info w Contact Information: [phone]  Emotional Assessment Appearance:: Appears stated age Attitude/Demeanor/Rapport: Engaged Affect (typically observed): Appropriate Orientation: : Oriented to Self, Oriented to Place, Oriented to  Time, Oriented to Situation Alcohol / Substance Use: Not Applicable Psych Involvement: No (comment)  Admission diagnosis:  Acute on chronic heart failure with preserved ejection fraction (HFpEF) (HCC) [I50.33] Patient Active Problem List   Diagnosis Date Noted   Chronic kidney disease (CKD), stage V (HCC) 06/15/2023   Volume overload 06/15/2023   Mitral regurgitation 09/22/2022   Tricuspid regurgitation 09/22/2022   Acute metabolic encephalopathy 07/28/2022   Acute on chronic anemia 02/18/2022   Pyuria, sterile 02/03/2022   Hypomagnesemia 12/26/2021   Acute lower UTI 11/05/2021   History of DVT (deep vein thrombosis) 11/05/2021   Depression 10/29/2021   Acute on chronic heart failure with preserved ejection fraction (HFpEF) (HCC) 10/24/2021  Chronic gastritis 10/19/2021   Weakness 12/02/2020   Chronic heart failure with preserved ejection fraction (HFpEF) (HCC) 12/02/2020   Elevated troponin 11/15/2020   Acute kidney injury superimposed on chronic kidney disease (HCC) 11/15/2020   Unintentional weight loss 11/15/2020    Malnutrition of moderate degree 09/27/2020   Transaminitis 09/23/2020   Acute renal failure superimposed on stage 4 chronic kidney disease (HCC) 09/03/2020   Chronic diastolic CHF (congestive heart failure) (HCC) 09/03/2020   Nausea & vomiting 09/03/2020   Dyspnea 06/28/2020   Chronic diarrhea - responds to budesonide 09/10/2019   Iron deficiency anemia 08/19/2018   Spondylosis of lumbar spine 12/24/2017   Renal vein thrombosis (HCC) 09/17/2017   Hoarse voice quality 09/09/2017   Low back pain without sciatica 09/09/2017   Hypokalemia due to excessive gastrointestinal loss of potassium    Leukocytosis    Essential hypertension    Urinary frequency    Type 2 diabetes mellitus (HCC)    Benign essential HTN    Dysphagia    Debility    Encephalopathy    Pressure injury of skin 08/04/2017   Diffuse pulmonary alveolar hemorrhage    SOB (shortness of breath) 10/11/2016   Hyperparathyroidism, secondary renal (HCC) 05/02/2016   Gout 04/11/2015   Diabetic neuropathy associated with type 2 diabetes mellitus (HCC) 01/06/2015   SBO (small bowel obstruction) (HCC) 09/15/2013   Breast cancer of lower-inner quadrant of right female breast (HCC) 03/18/2013   Chronic venous insufficiency of lower extremity 01/06/2013   Health care maintenance 05/08/2011   Seasonal allergies 05/03/2010   Labile hypertension    CAD (coronary artery disease)    Abdominal discomfort    Anemia of chronic kidney failure, stage 4 (severe) (HCC)    History of stroke without residual deficits    Chronic pain    Adnexal mass    GERD (gastroesophageal reflux disease) 01/19/2009   Spinal stenosis, lumbar region, with neurogenic claudication 01/03/2009   PCP:  Katie Dills, MD Pharmacy:   [facility] [address] ([place]), [place] - [address] [place]) [place] [address] Phone: [phone] Fax: [phone]     Social Drivers of Health (SDOH) Social History: SDOH  Screenings   Food Insecurity: No Food Insecurity (05/25/2023)  Housing: Low Risk  (05/25/2023)  Transportation Needs: No Transportation Needs (05/25/2023)  Utilities: Not At Risk (05/25/2023)  Alcohol Screen: Low Risk  (10/25/2021)  Depression (PHQ2-9): Low Risk  (09/12/2020)  Financial Resource Strain: Low Risk  (10/25/2021)  Social Connections: Unknown (06/16/2023)  Recent Concern: Social Connections - Socially Isolated (05/25/2023)  Tobacco Use: Low Risk  (06/15/2023)   SDOH Interventions:     Readmission Risk Interventions    05/13/2023    4:50 PM 12/27/2021    2:10 PM 11/08/2021   12:56 PM  Readmission Risk Prevention Plan  Transportation Screening Complete Complete Complete  Medication Review (RN Care Manager) Referral to Pharmacy Complete Complete  PCP or Specialist appointment within 3-5 days of discharge Complete Complete Complete  HRI or Home Care Consult Complete Complete   SW Recovery Care/Counseling Consult Complete Complete Complete  Palliative Care Screening Not Applicable  Not Applicable  Skilled Nursing Facility Not Applicable Not Applicable Not Applicable

## 2023-06-18 DIAGNOSIS — I5033 Acute on chronic diastolic (congestive) heart failure: Secondary | ICD-10-CM | POA: Diagnosis not present

## 2023-06-18 LAB — GLUCOSE, CAPILLARY
Glucose-Capillary: 139 mg/dL — ABNORMAL HIGH (ref 70–99)
Glucose-Capillary: 72 mg/dL (ref 70–99)
Glucose-Capillary: 93 mg/dL (ref 70–99)
Glucose-Capillary: 124 mg/dL — ABNORMAL HIGH (ref 70–99)
Glucose-Capillary: 119 mg/dL — ABNORMAL HIGH (ref 70–99)

## 2023-06-18 LAB — CBC
HCT: 26.1 % — ABNORMAL LOW (ref 36.0–46.0)
Hemoglobin: 8.1 g/dL — ABNORMAL LOW (ref 12.0–15.0)
MCH: 27.7 pg (ref 26.0–34.0)
MCHC: 31 g/dL (ref 30.0–36.0)
MCV: 89.4 fL (ref 80.0–100.0)
Platelets: 177 10*3/uL (ref 150–400)
RBC: 2.92 MIL/uL — ABNORMAL LOW (ref 3.87–5.11)
RDW: 18.4 % — ABNORMAL HIGH (ref 11.5–15.5)
WBC: 10.5 10*3/uL (ref 4.0–10.5)
nRBC: 0 % (ref 0.0–0.2)

## 2023-06-18 LAB — BASIC METABOLIC PANEL WITH GFR
Anion gap: 11 (ref 5–15)
BUN: 84 mg/dL — ABNORMAL HIGH (ref 8–23)
CO2: 25 mmol/L (ref 22–32)
Calcium: 8.4 mg/dL — ABNORMAL LOW (ref 8.9–10.3)
Chloride: 104 mmol/L (ref 98–111)
Creatinine, Ser: 4.3 mg/dL — ABNORMAL HIGH (ref 0.44–1.00)
GFR, Estimated: 10 mL/min — ABNORMAL LOW (ref >60)
Glucose, Bld: 103 mg/dL — ABNORMAL HIGH (ref 70–99)
Potassium: 4.3 mmol/L (ref 3.5–5.1)
Sodium: 140 mmol/L (ref 135–145)

## 2023-06-18 MED ORDER — HYDROMORPHONE HCL 1 MG/ML IJ SOLN
1.0000 mg | INTRAMUSCULAR | Status: DC | PRN
  Administered 2023-06-18: 1 mg via INTRAVENOUS
  Filled 2023-06-18: qty 1

## 2023-06-18 MED ORDER — IRON SUCROSE 20 MG/ML IV SOLN
200.0000 mg | Freq: Once | INTRAVENOUS | Status: AC
  Administered 2023-06-18: 200 mg via INTRAVENOUS
  Filled 2023-06-18: qty 10

## 2023-06-18 NOTE — Progress Notes (Signed)
 [facility] NEPHROLOGY PROGRESS NOTE  Assessment/ Plan: Pt is a 78 y.o. yo female  with history of hypertension, renal vein thrombosis on Eliquis, stroke, type II DM, lupus, CKD 4-5 who was recently admitted for CHF exacerbation and progressive CKD presented back with worsening dyspnea on exertion and fluid overload.  I had discussed with the patient and her daughter about dialysis during recent hospitalization and made plan to manage medically with diuretics and outpatient follow-up.  Her daughter works at [employer].  # Progressive CKD 5 versus AKI on CKD,: With frequent hospitalization for fluid overload.  She received IV Lasix yesterday with diuresis though creatinine which had improved and plateaued at 3.8 increased a bit to 4.3.  She has no overt sign or symptoms of uremia.   - Strict ins and out and monitor lab. -I have discussed with the patient and her daughter that she is fairly close to starting dialysis but has no indications.  I suspect she will end on with ESRD in 2025 and communicated that to them this admission -She f/u with Dr Signe Colt outpt and has appt today  - daughter to reschedule for next week. -watch 1 more day on po diuretics which will start this AM, suspect she can d/c tomorrow   # CKD-MBD/hyperphosphatemia: Started sevelamer in the recent visit, monitor lab.  Checking PTH level.   #Anemia of CKD: Concern about GI bleed, underwent endoscopy t recently with no sign of bleeding.  Back on Eliquis.  Hb was 6.4 and now 9 > 8.7 > 8.1 after transfusion.  Recent iron saturation 35% but down to 15% so redosed.  S/p aranesp 100 3/30.  Was receiving ESA outpt through Dr Mosetta Putt but due to repeated admission it's been since 1/30.    # Hypertension/volume: diuresing.  Remains hypertensive.  Cont current meds.   We will continue to follow.  Subjective: Seen and examined at bedside.  I/Os 1.2 / 3.8 - net neg 4.9L for admission. No family present today.  Pt just finished good  breakfast.  No uremic symptoms.  No LUTs.  Improving.  Objective Vital signs in last 24 hours: Vitals:   06/17/23 2100 06/18/23 0010 06/18/23 0514 06/18/23 0725  BP: (!) 159/68 (!) 150/60 (!) 154/61 (!) 152/46  Pulse: 85 80 81   Resp: 16 16 16 20   Temp: 98.6 F (37 C) 98.4 F (36.9 C) 99 F (37.2 C) 99.5 F (37.5 C)  TempSrc: Oral Oral Oral Oral  SpO2: 94% 96% 95% 94%  Weight:   67.6 kg   Height:       Weight change: -9.714 kg  Intake/Output Summary (Last 24 hours) at 06/18/2023 0743 Last data filed at 06/18/2023 0700 Gross per 24 hour  Intake 1257 ml  Output 3800 ml  Net -2543 ml       Labs: RENAL PANEL Recent Labs  Lab 06/15/23 1858 06/16/23 0301 06/17/23 0330 06/18/23 0334  NA 140 141 140 140  K 5.0 4.6 4.7 4.3  CL 109 110 105 104  CO2 20* 24 25 25   GLUCOSE 147* 110* 95 103*  BUN 82* 81* 81* 84*  CREATININE 4.08* 3.88* 3.85* 4.30*  CALCIUM 8.6* 8.4* 8.8* 8.4*  PHOS  --   --  4.1  --     Liver Function Tests: No results for input(s): "AST", "ALT", "ALKPHOS", "BILITOT", "PROT", "ALBUMIN" in the last 168 hours. No results for input(s): "LIPASE", "AMYLASE" in the last 168 hours. No results for input(s): "AMMONIA" in the last  168 hours. CBC: Recent Labs    01/31/23 1437 02/13/23 1332 02/28/23 1411 03/14/23 1450 04/04/23 1445 04/04/23 1446 04/18/23 1433 04/18/23 1434 05/08/23 1833 05/13/23 0358 05/13/23 0535 05/14/23 0802 05/28/23 0459 05/29/23 0735 06/15/23 1858 06/16/23 0301 06/16/23 1613 06/17/23 0330 06/18/23 0334  HGB 10.2*   < > 9.9*   < > 9.9*  --   --  9.4*   < >  --   --    < > 7.9*   < > 7.0* 6.4* 9.0* 8.7* 8.1*  MCV 87.7   < > 85.2   < > 85.9  --   --  86.4   < >  --   --    < > 87.1   < > 96.4 93.0 89.3 89.4 89.4  VITAMINB12 1,098*  --  833  --  700  --   --  835  --  1,040*  --   --   --   --   --   --   --   --   --   FOLATE  --   --   --   --   --   --   --   --   --   --  5.3*  --   --   --   --   --   --   --   --   FERRITIN   --   --   --   --   --  153 226  --   --  220  --   --  246  --   --   --   --  192  --   TIBC  --   --   --   --   --   --   --   --   --  206*  --   --  207*  --   --   --   --  267  --   IRON  --   --   --   --   --   --   --   --   --  46  --   --  73  --   --   --   --  40  --   RETICCTPCT  --   --   --   --   --   --   --   --   --  1.5  --   --   --   --   --   --   --   --   --    < > = values in this interval not displayed.    Cardiac Enzymes: No results for input(s): "CKTOTAL", "CKMB", "CKMBINDEX", "TROPONINI" in the last 168 hours. CBG: Recent Labs  Lab 06/17/23 0541 06/17/23 1049 06/17/23 1620 06/17/23 2059 06/18/23 0612  GLUCAP 84 87 93 139* 72    Iron Studies:  Recent Labs    06/17/23 0330  IRON 40  TIBC 267  FERRITIN 192   Studies/Results: No results found.   Medications: Infusions:   Scheduled Medications:  amLODipine  10 mg Oral Daily   apixaban  5 mg Oral BID   colestipol  1 g Oral TID   FLUoxetine  20 mg Oral Daily   folic acid  1 mg Oral Daily   gabapentin  300 mg Oral QHS   insulin aspart  0-5 Units Subcutaneous QHS   insulin  aspart  0-6 Units Subcutaneous TID WC   lipase/protease/amylase  36,000 Units Oral TID AC   mirtazapine  7.5 mg Oral QHS   pantoprazole  40 mg Oral BID   polyethylene glycol  17 g Oral Daily   rivastigmine  1.5 mg Oral BID   sevelamer carbonate  800 mg Oral TID WC   torsemide  60 mg Oral BID    have reviewed scheduled and prn medications.  Physical Exam: General:NAD, comfortable Heart:RRR, s1s2 nl Lungs:clear b/l, no crackle Abdomen:soft, Non-tender, non-distended Extremities: Bilateral lower extremities pitting edema trace now - improving Neurology: Alert awake and following commands  [name] 06/18/2023,7:43 AM  LOS: 2 days

## 2023-06-18 NOTE — Progress Notes (Addendum)
 PROGRESS NOTE    [name]  ZOX:[id number] DOB: 1945-10-05 DOA: 06/15/2023 PCP: Renford Dills, MD  77/F with CAD, chronic diastolic CHF, CKD 4, history of DVT, renal vein thrombosis on Eliquis, CVA, depression, memory loss, type 2 diabetes mellitus, history of breast cancer, lupus was admitted 3/8-15 with volume overload, progressive CKD, followed by nephrology, diuresed and discharged home on oral diuretics, also seen by gastroenterology for worsening chronic anemia, EGD and colonoscopy were unremarkable, Eliquis resumed at discharge. -Back in the ED 3/29 with progressive dyspnea on exertion orthopnea lower extremity edema and weight gain. -In the ED hypertensive, otherwise vital signs stable, hemoglobin 7.0, creatinine 4.0, BUN 82, BNP 560, troponin 125, chest x-ray unremarkable -Admitted, started on IV diuretics, nephrology following  Subjective: -Feels fair, no events overnight, breathing is improving  Assessment and Plan:  Acute on chronic HFpEF Progressive CKD stage 4/5 -Recent hospitalization for same  -Echo 3/25 with EF 60-65, grade 1 DD, normal RV  -Improving with diuresis, 4.9 L negative , uptrending creatinine noted, switched to oral torsemide -Nephrology consult appreciated, close to needing dialysis but has no acute indications now, needs close follow-up with nephrology   Anemia of chronic disease, iron deficiency -Underwent GI workup few weeks ago which was unremarkable -Baseline hemoglobin 8-10, lower this admission, transfused 1 unit PRBC 3/30 -Needs Aranesp, continue IV iron   CAD status post PCI in 2000 EKG without acute ischemic changes.  Stable   Hypertension -Continue amlodipine.   History of renal vein thrombosis 09/2017 and DVT 06/2020 -Restarted Eliquis   Depression Mild Dementia -Continue fluoxetine, rivastigmine   Type 2 diabetes A1c 6.2 on 05/08/2023.  Sensitive sliding scale   Chronic pain/neuropathy Continue gabapentin.    GERD/gastritis Continue Protonix.   Code Status: Full Code  DVT prophylaxis: Eliquis Code Status: Full code Family Communication: None present, will update daughter Disposition Plan: Home likely tomorrow  Consultants:    Procedures:   Antimicrobials:    Objective: Vitals:   06/18/23 0010 06/18/23 0514 06/18/23 0725 06/18/23 1121  BP: (!) 150/60 (!) 154/61 (!) 152/46 (!) 119/56  Pulse: 80 81  77  Resp: 16 16 20 14   Temp: 98.4 F (36.9 C) 99 F (37.2 C) 99.5 F (37.5 C) 98 F (36.7 C)  TempSrc: Oral Oral Oral Oral  SpO2: 96% 95% 94% 96%  Weight:  67.6 kg    Height:        Intake/Output Summary (Last 24 hours) at 06/18/2023 1125 Last data filed at 06/18/2023 0949 Gross per 24 hour  Intake 1017 ml  Output 2600 ml  Net -1583 ml   Filed Weights   06/16/23 0421 06/17/23 0425 06/18/23 0514  Weight: 80.4 kg 77.3 kg 67.6 kg    Examination:  Gen: Awake, Alert, Oriented X 3,  HEENT:+ JVD Lungs: Good air movement bilaterally, CTAB CVS: S1S2/RRR Abd: soft, Non tender, non distended, BS present Extremities: Trace edema Skin: no new rashes on exposed skin     Data Reviewed:   CBC: Recent Labs  Lab 06/15/23 1858 06/16/23 0301 06/16/23 1613 06/17/23 0330 06/18/23 0334  WBC 11.4* 10.5 11.9* 10.7* 10.5  HGB 7.0* 6.4* 9.0* 8.7* 8.1*  HCT 24.1* 21.1* 28.4* 27.8* 26.1*  MCV 96.4 93.0 89.3 89.4 89.4  PLT 192 181 192 174 177   Basic Metabolic Panel: Recent Labs  Lab 06/15/23 1858 06/16/23 0301 06/17/23 0330 06/18/23 0334  NA 140 141 140 140  K 5.0 4.6 4.7 4.3  CL 109 110 105 104  CO2 20* 24 25 25   GLUCOSE 147* 110* 95 103*  BUN 82* 81* 81* 84*  CREATININE 4.08* 3.88* 3.85* 4.30*  CALCIUM 8.6* 8.4* 8.8* 8.4*  PHOS  --   --  4.1  --    GFR: Estimated Creatinine Clearance: 10.7 mL/min (A) (by C-G formula based on SCr of 4.3 mg/dL (H)). Liver Function Tests: No results for input(s): "AST", "ALT", "ALKPHOS", "BILITOT", "PROT", "ALBUMIN" in the last 168  hours. No results for input(s): "LIPASE", "AMYLASE" in the last 168 hours. No results for input(s): "AMMONIA" in the last 168 hours. Coagulation Profile: No results for input(s): "INR", "PROTIME" in the last 168 hours. Cardiac Enzymes: No results for input(s): "CKTOTAL", "CKMB", "CKMBINDEX", "TROPONINI" in the last 168 hours. BNP (last 3 results) No results for input(s): "PROBNP" in the last 8760 hours. HbA1C: No results for input(s): "HGBA1C" in the last 72 hours. CBG: Recent Labs  Lab 06/17/23 1049 06/17/23 1620 06/17/23 2059 06/18/23 0612 06/18/23 1118  GLUCAP 87 93 139* 72 93   Lipid Profile: No results for input(s): "CHOL", "HDL", "LDLCALC", "TRIG", "CHOLHDL", "LDLDIRECT" in the last 72 hours. Thyroid Function Tests: No results for input(s): "TSH", "T4TOTAL", "FREET4", "T3FREE", "THYROIDAB" in the last 72 hours. Anemia Panel: Recent Labs    06/17/23 0330  FERRITIN 192  TIBC 267  IRON 40   Urine analysis:    Component Value Date/Time   COLORURINE STRAW (A) 05/26/2023 1610   APPEARANCEUR CLEAR 05/26/2023 1610   APPEARANCEUR Clear 08/22/2018 1620   LABSPEC 1.008 05/26/2023 1610   LABSPEC 1.020 03/03/2014 1341   PHURINE 5.0 05/26/2023 1610   GLUCOSEU NEGATIVE 05/26/2023 1610   GLUCOSEU Negative 03/03/2014 1341   HGBUR SMALL (A) 05/26/2023 1610   BILIRUBINUR NEGATIVE 05/26/2023 1610   BILIRUBINUR Negative 08/22/2018 1620   BILIRUBINUR Negative 03/03/2014 1341   KETONESUR NEGATIVE 05/26/2023 1610   PROTEINUR 100 (A) 05/26/2023 1610   UROBILINOGEN 0.2 03/03/2014 1341   NITRITE NEGATIVE 05/26/2023 1610   LEUKOCYTESUR NEGATIVE 05/26/2023 1610   LEUKOCYTESUR Negative 03/03/2014 1341   Sepsis Labs: @LABRCNTIP (procalcitonin:4,lacticidven:4)  )No results found for this or any previous visit (from the past 240 hours).   Radiology Studies: No results found.    Scheduled Meds:  amLODipine  10 mg Oral Daily   apixaban  5 mg Oral BID   colestipol  1 g Oral TID    FLUoxetine  20 mg Oral Daily   folic acid  1 mg Oral Daily   gabapentin  300 mg Oral QHS   insulin aspart  0-5 Units Subcutaneous QHS   insulin aspart  0-6 Units Subcutaneous TID WC   lipase/protease/amylase  36,000 Units Oral TID AC   mirtazapine  7.5 mg Oral QHS   pantoprazole  40 mg Oral BID   polyethylene glycol  17 g Oral Daily   rivastigmine  1.5 mg Oral BID   sevelamer carbonate  800 mg Oral TID WC   torsemide  60 mg Oral BID   Continuous Infusions:     LOS: 2 days    Time spent:    Zannie Cove, MD [facility]   06/18/2023, 11:25 AM

## 2023-06-18 NOTE — Progress Notes (Signed)
 Mobility Specialist Progress Note:   06/18/23 1000  Mobility  Activity Ambulated independently in room  Level of Assistance Modified independent, requires aide device or extra time  Assistive Device None  Distance Ambulated (ft) 15 ft  Activity Response Tolerated well  Mobility Referral Yes  Mobility visit 1 Mobility  Mobility Specialist Start Time (ACUTE ONLY) 1000  Mobility Specialist Stop Time (ACUTE ONLY) 1010  Mobility Specialist Time Calculation (min) (ACUTE ONLY) 10 min   Pt agreeable to mobility session, received ambulating in room independently. Pt declined hallway ambulation d/t pain. Pt left in chair with all needs met, ice pack given for back pain.  [name] Mobility Specialist Please contact via SecureChat or  Rehab office at [phone]

## 2023-06-19 ENCOUNTER — Inpatient Hospital Stay (HOSPITAL_COMMUNITY)

## 2023-06-19 DIAGNOSIS — N185 Chronic kidney disease, stage 5: Secondary | ICD-10-CM | POA: Diagnosis not present

## 2023-06-19 DIAGNOSIS — I5033 Acute on chronic diastolic (congestive) heart failure: Secondary | ICD-10-CM | POA: Diagnosis not present

## 2023-06-19 DIAGNOSIS — N186 End stage renal disease: Secondary | ICD-10-CM

## 2023-06-19 LAB — CBC
HCT: 25.4 % — ABNORMAL LOW (ref 36.0–46.0)
Hemoglobin: 7.8 g/dL — ABNORMAL LOW (ref 12.0–15.0)
MCH: 27.8 pg (ref 26.0–34.0)
MCHC: 30.7 g/dL (ref 30.0–36.0)
MCV: 90.4 fL (ref 80.0–100.0)
Platelets: 169 10*3/uL (ref 150–400)
RBC: 2.81 MIL/uL — ABNORMAL LOW (ref 3.87–5.11)
RDW: 18.2 % — ABNORMAL HIGH (ref 11.5–15.5)
WBC: 10.9 10*3/uL — ABNORMAL HIGH (ref 4.0–10.5)
nRBC: 0.3 % — ABNORMAL HIGH (ref 0.0–0.2)

## 2023-06-19 LAB — TYPE AND SCREEN
ABO/RH(D): B NEG
Antibody Screen: NEGATIVE
Unit division: 0
Unit division: 0

## 2023-06-19 LAB — BASIC METABOLIC PANEL WITH GFR
Anion gap: 11 (ref 5–15)
BUN: 92 mg/dL — ABNORMAL HIGH (ref 8–23)
CO2: 24 mmol/L (ref 22–32)
Calcium: 8 mg/dL — ABNORMAL LOW (ref 8.9–10.3)
Chloride: 100 mmol/L (ref 98–111)
Creatinine, Ser: 4.83 mg/dL — ABNORMAL HIGH (ref 0.44–1.00)
GFR, Estimated: 9 mL/min — ABNORMAL LOW (ref >60)
Glucose, Bld: 104 mg/dL — ABNORMAL HIGH (ref 70–99)
Potassium: 4.1 mmol/L (ref 3.5–5.1)
Sodium: 135 mmol/L (ref 135–145)

## 2023-06-19 LAB — BPAM RBC
Blood Product Expiration Date: 202504162359
Blood Product Expiration Date: 202504172359
ISSUE DATE / TIME: 202503300542
Unit Type and Rh: 1700
Unit Type and Rh: 1700

## 2023-06-19 LAB — GLUCOSE, CAPILLARY
Glucose-Capillary: 75 mg/dL (ref 70–99)
Glucose-Capillary: 117 mg/dL — ABNORMAL HIGH (ref 70–99)
Glucose-Capillary: 217 mg/dL — ABNORMAL HIGH (ref 70–99)
Glucose-Capillary: 201 mg/dL — ABNORMAL HIGH (ref 70–99)

## 2023-06-19 LAB — PTH, INTACT AND CALCIUM
Calcium, Total (PTH): 8.3 mg/dL — ABNORMAL LOW (ref 8.7–10.3)
PTH: 220 pg/mL — ABNORMAL HIGH (ref 15–65)

## 2023-06-19 LAB — URIC ACID: Uric Acid, Serum: 12.3 mg/dL — ABNORMAL HIGH (ref 2.5–7.1)

## 2023-06-19 MED ORDER — PREDNISONE 20 MG PO TABS
40.0000 mg | ORAL_TABLET | Freq: Every day | ORAL | Status: AC
  Administered 2023-06-19 – 2023-06-21 (×3): 40 mg via ORAL
  Filled 2023-06-19 (×3): qty 2

## 2023-06-19 MED ORDER — FUROSEMIDE 10 MG/ML IJ SOLN
80.0000 mg | Freq: Three times a day (TID) | INTRAMUSCULAR | Status: DC
  Administered 2023-06-19 – 2023-06-21 (×8): 80 mg via INTRAVENOUS
  Filled 2023-06-19 (×9): qty 8

## 2023-06-19 NOTE — Progress Notes (Addendum)
 PROGRESS NOTE    [name]  WNU:[id number] DOB: 17-Sep-1945 DOA: 06/15/2023 PCP: Renford Dills, MD  77/F with CAD, chronic diastolic CHF, CKD 4, history of DVT, renal vein thrombosis on Eliquis, CVA, depression, memory loss, type 2 diabetes mellitus, history of breast cancer, lupus was admitted 3/8-15 with volume overload, progressive CKD, followed by nephrology, diuresed and discharged home on oral diuretics, also seen by gastroenterology for worsening chronic anemia, EGD and colonoscopy were unremarkable, Eliquis resumed at discharge. -Back in the ED 3/29 with progressive dyspnea on exertion orthopnea lower extremity edema and weight gain. -In the ED hypertensive, otherwise vital signs stable, hemoglobin 7.0, creatinine 4.0, BUN 82, BNP 560, troponin 125, chest x-ray unremarkable -Admitted, started on IV diuretics, nephrology following -4/2, vascular surgery consult, plan to start HD  Subjective: -Some shortness of breath, worsening swelling -Complains of pain in her left foot  Assessment and Plan:  Acute on chronic HFpEF Progressive CKD stage 4/5 -Recent hospitalization for same  -Echo 3/25 with EF 60-65, grade 1 DD, normal RV  -Was initially improving with diuresis, 5 L negative, now with worsening kidney function and volume overloaded state -Nephrology following, plan to start HD -Hold Eliquis   Anemia of chronic disease, iron deficiency -Underwent GI workup few weeks ago which was unremarkable -Baseline hemoglobin 8-10, lower this admission, transfused 1 unit PRBC 3/30 -Needs Aranesp, continue IV iron  Left foot, suspected gout flare -Add prednisone, check uric acid   CAD status post PCI in 2000 EKG without acute ischemic changes.  Stable   Hypertension -Continue amlodipine.   History of renal vein thrombosis 09/2017 and DVT 06/2020 -Hold Eliquis, will resume after vascular surgeries   Depression Mild Dementia -Continue fluoxetine, rivastigmine   Type 2  diabetes A1c 6.2 on 05/08/2023.  Sensitive sliding scale   Chronic pain/neuropathy Continue gabapentin.   GERD/gastritis Continue Protonix.   DVT prophylaxis: Eliquis> now held Code Status: Full code Family Communication: No family at bedside updated daughter yesterday Disposition Plan: Home likely 2 to 3 days  Consultants:    Procedures:   Antimicrobials:    Objective: Vitals:   06/19/23 0531 06/19/23 0541 06/19/23 0738 06/19/23 0856  BP:  (!) 145/60 (!) 163/66 (!) 139/51  Pulse:  84 83   Resp:  15 15   Temp:  99.1 F (37.3 C) 98.9 F (37.2 C)   TempSrc:  Oral Oral   SpO2:  95% 91%   Weight: 75.9 kg     Height:        Intake/Output Summary (Last 24 hours) at 06/19/2023 1028 Last data filed at 06/19/2023 0850 Gross per 24 hour  Intake 478 ml  Output 1200 ml  Net -722 ml   Filed Weights   06/17/23 0425 06/18/23 0514 06/19/23 0531  Weight: 77.3 kg 67.6 kg 75.9 kg    Examination:  Gen: Awake, Alert, Oriented X 3,  HEENT:+ JVD Lungs: Decreased breath sounds at the bases CVS: S1S2/RRR Abd: soft, Non tender, non distended, BS present Extremities: 2+ edema Skin: no new rashes on exposed skin     Data Reviewed:   CBC: Recent Labs  Lab 06/16/23 0301 06/16/23 1613 06/17/23 0330 06/18/23 0334 06/19/23 0237  WBC 10.5 11.9* 10.7* 10.5 10.9*  HGB 6.4* 9.0* 8.7* 8.1* 7.8*  HCT 21.1* 28.4* 27.8* 26.1* 25.4*  MCV 93.0 89.3 89.4 89.4 90.4  PLT 181 192 174 177 169   Basic Metabolic Panel: Recent Labs  Lab 06/15/23 1858 06/16/23 0301 06/17/23 0330 06/18/23 0334 06/19/23  0237  NA 140 141 140 140 135  K 5.0 4.6 4.7 4.3 4.1  CL 109 110 105 104 100  CO2 20* 24 25 25 24   GLUCOSE 147* 110* 95 103* 104*  BUN 82* 81* 81* 84* 92*  CREATININE 4.08* 3.88* 3.85* 4.30* 4.83*  CALCIUM 8.6* 8.4* 8.8* 8.4* 8.0*  PHOS  --   --  4.1  --   --    GFR: Estimated Creatinine Clearance: 10 mL/min (A) (by C-G formula based on SCr of 4.83 mg/dL (H)). Liver Function  Tests: No results for input(s): "AST", "ALT", "ALKPHOS", "BILITOT", "PROT", "ALBUMIN" in the last 168 hours. No results for input(s): "LIPASE", "AMYLASE" in the last 168 hours. No results for input(s): "AMMONIA" in the last 168 hours. Coagulation Profile: No results for input(s): "INR", "PROTIME" in the last 168 hours. Cardiac Enzymes: No results for input(s): "CKTOTAL", "CKMB", "CKMBINDEX", "TROPONINI" in the last 168 hours. BNP (last 3 results) No results for input(s): "PROBNP" in the last 8760 hours. HbA1C: No results for input(s): "HGBA1C" in the last 72 hours. CBG: Recent Labs  Lab 06/18/23 0612 06/18/23 1118 06/18/23 1557 06/18/23 2101 06/19/23 0532  GLUCAP 72 93 124* 119* 75   Lipid Profile: No results for input(s): "CHOL", "HDL", "LDLCALC", "TRIG", "CHOLHDL", "LDLDIRECT" in the last 72 hours. Thyroid Function Tests: No results for input(s): "TSH", "T4TOTAL", "FREET4", "T3FREE", "THYROIDAB" in the last 72 hours. Anemia Panel: Recent Labs    06/17/23 0330  FERRITIN 192  TIBC 267  IRON 40   Urine analysis:    Component Value Date/Time   COLORURINE STRAW (A) 05/26/2023 1610   APPEARANCEUR CLEAR 05/26/2023 1610   APPEARANCEUR Clear 08/22/2018 1620   LABSPEC 1.008 05/26/2023 1610   LABSPEC 1.020 03/03/2014 1341   PHURINE 5.0 05/26/2023 1610   GLUCOSEU NEGATIVE 05/26/2023 1610   GLUCOSEU Negative 03/03/2014 1341   HGBUR SMALL (A) 05/26/2023 1610   BILIRUBINUR NEGATIVE 05/26/2023 1610   BILIRUBINUR Negative 08/22/2018 1620   BILIRUBINUR Negative 03/03/2014 1341   KETONESUR NEGATIVE 05/26/2023 1610   PROTEINUR 100 (A) 05/26/2023 1610   UROBILINOGEN 0.2 03/03/2014 1341   NITRITE NEGATIVE 05/26/2023 1610   LEUKOCYTESUR NEGATIVE 05/26/2023 1610   LEUKOCYTESUR Negative 03/03/2014 1341   Sepsis Labs: @LABRCNTIP (procalcitonin:4,lacticidven:4)  )No results found for this or any previous visit (from the past 240 hours).   Radiology Studies: No results  found.    Scheduled Meds:  amLODipine  10 mg Oral Daily   colestipol  1 g Oral TID   FLUoxetine  20 mg Oral Daily   folic acid  1 mg Oral Daily   furosemide  80 mg Intravenous TID   gabapentin  300 mg Oral QHS   insulin aspart  0-5 Units Subcutaneous QHS   insulin aspart  0-6 Units Subcutaneous TID WC   lipase/protease/amylase  36,000 Units Oral TID AC   mirtazapine  7.5 mg Oral QHS   pantoprazole  40 mg Oral BID   polyethylene glycol  17 g Oral Daily   predniSONE  40 mg Oral Q breakfast   rivastigmine  1.5 mg Oral BID   sevelamer carbonate  800 mg Oral TID WC   Continuous Infusions:     LOS: 3 days    Time spent:    Zannie Cove, MD [facility]   06/19/2023, 10:28 AM

## 2023-06-19 NOTE — Progress Notes (Signed)
   06/19/23 1632  What Happened  Was fall witnessed? Yes  Who witnessed fall? Sherlie Ban, RN  Patients activity before fall ambulating-assisted  Point of contact hip/leg;other (comment) (knees)  Provider Notification  Provider Name/Title Zannie Cove MD  Date Provider Notified 06/19/23  Time Provider Notified [phone]  Method of Notification Page  Notification Reason Fall  Provider response No new orders  Date of Provider Response 06/19/23  Time of Provider Response 1244  Follow Up  Family notified Yes - comment  Time family notified 1657  Additional tests No  Progress note created (see row info) Yes  Adult Fall Risk Assessment  Risk Factor Category (scoring not indicated) Fall has occurred during this admission (document High fall risk)  Age 78  Fall History: Fall within 6 months prior to admission 0  Elimination; Bowel and/or Urine Incontinence 0  Elimination; Bowel and/or Urine Urgency/Frequency 0  Medications: includes PCA/Opiates, Anti-convulsants, Anti-hypertensives, Diuretics, Hypnotics, Laxatives, Sedatives, and Psychotropics 3  Patient Care Equipment 1  Mobility-Assistance 2  Mobility-Gait 2  Mobility-Sensory Deficit 0  Altered awareness of immediate physical environment 0  Impulsiveness 0  Lack of understanding of one's physical/cognitive limitations 0  Total Score 10  Patient Fall Risk Level High fall risk  Adult Fall Risk Interventions  Required Bundle Interventions *See Row Information* High fall risk - low, moderate, and high requirements implemented  Additional Interventions Use of appropriate toileting equipment (bedpan, BSC, etc.)  Screening for Fall Injury Risk (To be completed on HIGH fall risk patients) - Assessing Need for Floor Mats  Risk For Fall Injury- Criteria for Floor Mats Previous fall this admission  Will Implement Floor Mats Yes  Vitals  Temp 98.5 F (36.9 C)  Temp Source Oral  BP (!) 149/66  MAP (mmHg) 89  BP Location Left Arm  BP  Method Automatic  Patient Position (if appropriate) Lying  Pulse Rate 85  Pulse Rate Source Monitor  ECG Heart Rate 85  Resp 17  Oxygen Therapy  SpO2 100 %  O2 Device Room Air  Pain Assessment  Pain Scale 0-10  Pain Score 0  PCA/Epidural/Spinal Assessment  Respiratory Pattern Regular;Unlabored  Neurological  Neuro (WDL) WDL  Level of Consciousness Alert  Orientation Level Oriented X4  Cognition Appropriate at baseline  Speech Clear  Motor Function/Sensation Assessment Plantar flexion;Dorsiflexion;Grip  Facial Symmetry Symmetrical  R Hand Grip Moderate  L Hand Grip Moderate  R Foot Dorsiflexion Moderate  L Foot Dorsiflexion Moderate  R Foot Plantar Flexion Moderate  L Foot Plantar Flexion Moderate  Neuro Symptoms None  Glasgow Coma Scale  Eye Opening 4  Best Verbal Response (NON-intubated) 5  Best Motor Response 6  Glasgow Coma Scale Score 15  Musculoskeletal  Musculoskeletal (WDL) X  Assistive Device BSC;Front wheel walker  Generalized Weakness Yes  Weight Bearing Restrictions Per Provider Order No  Musculoskeletal Details  RUE Full movement  LUE Full movement  RLE Weakness  LLE Weakness  Integumentary  Integumentary (WDL) WDL  Skin Color Appropriate for ethnicity  Skin Condition Dry  Skin Integrity Erythema/redness  Erythema/Redness Location Buttocks  Erythema/Redness Location Orientation Bilateral  Skin Turgor Non-tenting

## 2023-06-19 NOTE — Progress Notes (Addendum)
 Upper extremity venous duplex completed. Please see CV Procedures for preliminary results.  Shona Simpson, RVT 06/19/23 3:32 PM

## 2023-06-19 NOTE — Progress Notes (Addendum)
 [facility] NEPHROLOGY PROGRESS NOTE  Assessment/ Plan: Pt is a 78 y.o. yo female  with history of hypertension, renal vein thrombosis on Eliquis, stroke, type II DM, lupus, CKD 4-5 who was recently admitted for CHF exacerbation and progressive CKD presented back with worsening dyspnea on exertion and fluid overload.  I had discussed with the patient and her daughter about dialysis during recent hospitalization and made plan to manage medically with diuretics and outpatient follow-up.  Her daughter works at [employer].  # Progressive CKD 5 versus AKI on CKD,: With frequent hospitalization for fluid overload.  Responding to IV diuretics but no po and with worsening renal function d/w patient today at length recommend starting dialysis.  She is agreeable.  Will vein map, c/s VVS for TDC + perm access.  In the meantime resume IV diuretics.  CLIP to outpt HD.  Will call daughter to update today.    # CKD-MBD/hyperphosphatemia: Started sevelamer in the recent visit, monitor lab.  Checking PTH level.   #Anemia of CKD: Concern about GI bleed, underwent endoscopy t recently with no sign of bleeding.  Back on Eliquis.  Hb was 6.4 and now 9 > 8.7 > 8.1 after transfusion.  Recent iron saturation 35% but down to 15% so redosed.  S/p aranesp 100 3/30.  Was receiving ESA outpt through Dr Mosetta Putt but due to repeated admission it's been since 1/30.    # Hypertension/volume: diuresing.  Remains hypertensive.  Cont current meds.   #A fib: on eliquis - will need to be held in prep for AV access surgery.   We will continue to follow.  Subjective: Seen and examined at bedside. Worsening edema and UOP < 1L yesterday with high dose oral diuretics.  No family present today.  Pt just finished good breakfast.  No frank uremic.  No LUTs.    Objective Vital signs in last 24 hours: Vitals:   06/19/23 0531 06/19/23 0541 06/19/23 0738 06/19/23 0856  BP:  (!) 145/60 (!) 163/66 (!) 139/51  Pulse:  84 83   Resp:  15 15    Temp:  99.1 F (37.3 C) 98.9 F (37.2 C)   TempSrc:  Oral Oral   SpO2:  95% 91%   Weight: 75.9 kg     Height:       Weight change: 8.301 kg  Intake/Output Summary (Last 24 hours) at 06/19/2023 0959 Last data filed at 06/19/2023 0850 Gross per 24 hour  Intake 478 ml  Output 1200 ml  Net -722 ml       Labs: RENAL PANEL Recent Labs  Lab 06/15/23 1858 06/16/23 0301 06/17/23 0330 06/18/23 0334 06/19/23 0237  NA 140 141 140 140 135  K 5.0 4.6 4.7 4.3 4.1  CL 109 110 105 104 100  CO2 20* 24 25 25 24   GLUCOSE 147* 110* 95 103* 104*  BUN 82* 81* 81* 84* 92*  CREATININE 4.08* 3.88* 3.85* 4.30* 4.83*  CALCIUM 8.6* 8.4* 8.8* 8.4* 8.0*  PHOS  --   --  4.1  --   --     Liver Function Tests: No results for input(s): "AST", "ALT", "ALKPHOS", "BILITOT", "PROT", "ALBUMIN" in the last 168 hours. No results for input(s): "LIPASE", "AMYLASE" in the last 168 hours. No results for input(s): "AMMONIA" in the last 168 hours. CBC: Recent Labs    01/31/23 1437 02/13/23 1332 02/28/23 1411 03/14/23 1450 04/04/23 1445 04/04/23 1446 04/18/23 1433 04/18/23 1434 05/08/23 1833 05/13/23 0865 05/13/23 0535 05/14/23 0802 05/28/23 7846  05/29/23 0735 06/16/23 0301 06/16/23 1613 06/17/23 0330 06/18/23 0334 06/19/23 0237  HGB 10.2*   < > 9.9*   < > 9.9*  --   --  9.4*   < >  --   --    < > 7.9*   < > 6.4* 9.0* 8.7* 8.1* 7.8*  MCV 87.7   < > 85.2   < > 85.9  --   --  86.4   < >  --   --    < > 87.1   < > 93.0 89.3 89.4 89.4 90.4  VITAMINB12 1,098*  --  833  --  700  --   --  835  --  1,040*  --   --   --   --   --   --   --   --   --   FOLATE  --   --   --   --   --   --   --   --   --   --  5.3*  --   --   --   --   --   --   --   --   FERRITIN  --   --   --   --   --  153 226  --   --  220  --   --  246  --   --   --  192  --   --   TIBC  --   --   --   --   --   --   --   --   --  206*  --   --  207*  --   --   --  267  --   --   IRON  --   --   --   --   --   --   --   --   --  46   --   --  73  --   --   --  40  --   --   RETICCTPCT  --   --   --   --   --   --   --   --   --  1.5  --   --   --   --   --   --   --   --   --    < > = values in this interval not displayed.    Cardiac Enzymes: No results for input(s): "CKTOTAL", "CKMB", "CKMBINDEX", "TROPONINI" in the last 168 hours. CBG: Recent Labs  Lab 06/18/23 0612 06/18/23 1118 06/18/23 1557 06/18/23 2101 06/19/23 0532  GLUCAP 72 93 124* 119* 75    Iron Studies:  Recent Labs    06/17/23 0330  IRON 40  TIBC 267  FERRITIN 192   Studies/Results: No results found.   Medications: Infusions:   Scheduled Medications:  amLODipine  10 mg Oral Daily   apixaban  5 mg Oral BID   colestipol  1 g Oral TID   FLUoxetine  20 mg Oral Daily   folic acid  1 mg Oral Daily   gabapentin  300 mg Oral QHS   insulin aspart  0-5 Units Subcutaneous QHS   insulin aspart  0-6 Units Subcutaneous TID WC   lipase/protease/amylase  36,000 Units Oral TID AC   mirtazapine  7.5 mg Oral QHS   pantoprazole  40 mg Oral BID  polyethylene glycol  17 g Oral Daily   predniSONE  40 mg Oral Q breakfast   rivastigmine  1.5 mg Oral BID   sevelamer carbonate  800 mg Oral TID WC    have reviewed scheduled and prn medications.  Physical Exam: General:NAD, comfortable Heart:RRR, s1s2 nl Lungs:clear b/l, no crackle Abdomen:soft, Non-tender, non-distended Extremities: BL 2+ pitting edema Neurology: Alert awake and following commands  [name] 06/19/2023,9:59 AM  LOS: 3 days

## 2023-06-19 NOTE — Consult Note (Addendum)
 Hospital Consult    Reason for Consult:  ESRD needs access Requesting Physician:  Dr. Glenna Fellows MRN #:  [id number]  History of Present Illness: This is a 78 y.o. female with medical history of HTN, stroke, Type II DM, Lupus, Renal vein thrombosis on Eliquis, CHF, and CKD IV/V who presented with worsening dyspnea and volume overload. She has had frequent hospitalizations for volume overload and Nephrology has now recommended starting dialysis. Vein mapping has been ordered but is pending. She is currently being managed on IV Diuretics. She has never been on dialysis before. She has no history of prior central lines, IV's, port or pacemakers. She is right hand dominant. She has no upper extremity limitations. Vascular surgery was consulted for [facility] placement and new permanent access creation.   Past Medical History:  Diagnosis Date   Abdominal discomfort    Chronic N/V/D. Presumptive dx Crohn's dx per elevated p ANCA. Failed Entocort and Pentasa. Sep 2003 - ileocolectomy c anastomosis per Dr Orson Slick 2/2 adhesions - path was hegative for Crohns. EGD, Sm bowel follow through (11/03), and an eteroclysis (10/03) were unrevealing. Cuases hypomag and hypocalcemia.   Adnexal mass 10/2001   s/p lap BSO (R ovarian fibroma) & lysis of adhesions   Allergy    Seasonal   Anemia    Multifactorial. Baseline HgB 10-11 ish. B12 def - 150 in 3/10. Fe Def - ferritin 35 3/10. Both are being repleted.   Breast cancer (HCC) 03/16/2013   right, 5 o'clock   CAD (coronary artery disease) 1996   1996 - PTCA and angioplasty diagonal branch. 2000 - Rotoblator & angiopllasty of diagonal. 2006 - subendocardial AMI, DES to proximal LAD.Marland Kitchen Also had 90% stenosis in distal apical LAD. EF 55 with apical hypokinesis. Indefinite ASA and Plavix.   CHF (congestive heart failure) (HCC)    Chronic kidney disease, stage 4 (severe) (HCC)    Chronic pain    CT 10/10 = Spinal stenosis L2 - S1.   COVID-19 11/02/2020   Diabetes mellitus     Insulin dependent   Diabetes mellitus type 2 in obese 05/03/2010   Managed on lantus and novolog. Has diabetic nephropathy. Metformin D/C'd 2012 2/2 creatinine.  No diabetic retinopathy per 3/11.    Gout    Hx of radiation therapy 06/02/13- 07/16/13   right rbeast 4500 cGy 25 sessions, right breast boost 1600 cGy in 8 sessions   Hyperlipidemia    Managed with both a statin and Welchol. Welchol stopped 2014 2/2 cost and started on fenofibrate    Hypertension    2006 B renal arteries patent. 2003 MRA - no RAS. 2003 pheo W/U Dr Caryn Section reportedly negative.   Hypoxia 07/23/2017   Lupus    Lymphedema of breast    Personal history of radiation therapy 2015   Pneumonia of left lung due to infectious organism    RBBB    Renal vein thrombosis (HCC)    SBO (small bowel obstruction) (HCC) 09/17/2017   Secondary hyperparathyroidism (HCC)    Stroke [facility])    Incidental finding MRI 2002 L lacunar infarct   Vitamin B12 deficiency    Vitamin D deficiency    Wears dentures    top    Past Surgical History:  Procedure Laterality Date   ABDOMINAL HYSTERECTOMY     BILATERAL SALPINGOOPHORECTOMY  8/03   Lap BSO (R ovarian fibroma) and adhesion lysis   BIOPSY  09/08/2020   Procedure: BIOPSY;  Surgeon: Bernette Redbird, MD;  Location: [facility];  Service:  Endoscopy;;   BOWEL RESECTION  2003   ileocolectomy with anastomosis 2/2 adhesions   BREAST BIOPSY Right 2015   BREAST BIOPSY Right 07/2014   BREAST LUMPECTOMY Right 04/22/2013   BREAST LUMPECTOMY WITH NEEDLE LOCALIZATION AND AXILLARY SENTINEL LYMPH NODE BX Right 04/22/2013   Procedure: BREAST LUMPECTOMY WITH NEEDLE LOCALIZATION AND AXILLARY SENTINEL LYMPH NODE BX;  Surgeon: Almond Lint, MD;  Location: [facility];  Service: General;  Laterality: Right;   CARDIAC CATHETERIZATION     2 stents   CHOLECYSTECTOMY     COLONOSCOPY     COLONOSCOPY N/A 05/30/2023   Procedure: COLONOSCOPY;  Surgeon: Kathi Der, MD;  Location: [facility];   Service: Gastroenterology;  Laterality: N/A;   ESOPHAGOGASTRODUODENOSCOPY N/A 09/08/2020   Procedure: ESOPHAGOGASTRODUODENOSCOPY (EGD);  Surgeon: Bernette Redbird, MD;  Location: [facility];  Service: Endoscopy;  Laterality: N/A;   ESOPHAGOGASTRODUODENOSCOPY N/A 05/30/2023   Procedure: EGD (ESOPHAGOGASTRODUODENOSCOPY);  Surgeon: Kathi Der, MD;  Location: [facility];  Service: Gastroenterology;  Laterality: N/A;   HEMICOLECTOMY     R sided hemicolectomy   HERNIA REPAIR     Ventral hernia repair   PTCA  4/06    Allergies  Allergen Reactions   Anesthetics, Halogenated Other (See Comments)    Per patient's daughter, she can not tolerate anesthetics.    Etomidate Other (See Comments)    Heart stopped during anesthesia   Fentanyl Other (See Comments)    Heart stopped during anesthesia    Percocet [Oxycodone-Acetaminophen] Other (See Comments)    Headaches    Diazepam Other (See Comments)    Agitation   Lorazepam Nausea Only   Tramadol Hcl Nausea And Vomiting   Haloperidol Nausea And Vomiting    **Haldol**   Morphine Sulfate Nausea Only    agitation   Propoxyphene Nausea And Vomiting    **Darvocet** no longer available   Versed [Midazolam] Other (See Comments)    Heart stopped during anesthesia    Prior to Admission medications   Medication Sig Start Date End Date Taking? Authorizing Provider  acetaminophen (TYLENOL) 650 MG CR tablet Take 1,300 mg by mouth every 8 (eight) hours as needed for pain.   Yes [provider]  amLODipine (NORVASC) 10 MG tablet Take 1 tablet (10 mg total) by mouth daily. 10/26/22  Yes   apixaban (ELIQUIS) 2.5 MG TABS tablet Take 1 tablet (2.5 mg total) by mouth 2 (two) times daily. 10/30/21  Yes Arrien, York Ram, MD  colestipol (COLESTID) 1 g tablet Take 1 tablet (1 g total) by mouth 3 (three) times daily. 05/14/23  Yes Deanna Artis, DO  diphenoxylate-atropine (LOMOTIL) 2.5-0.025 MG tablet Take 1 tablet by mouth 2 (two) times  daily as needed for diarrhea or loose stools. 05/14/23 05/13/24 Yes Deanna Artis, DO  famotidine (PEPCID) 20 MG tablet Take 20 mg by mouth daily as needed for heartburn or indigestion. 10/14/21  Yes [provider]  FLUoxetine (PROZAC) 20 MG capsule Take 20 mg by mouth daily. 05/05/23  Yes [provider]  folic acid (FOLVITE) 1 MG tablet Take 1 tablet (1 mg total) by mouth daily. 05/14/23 05/13/24 Yes Deanna Artis, DO  furosemide (LASIX) 40 MG tablet Take 1 tablet (40 mg total) by mouth daily. Patient taking differently: Take 40-80 mg by mouth See admin instructions. Take 1 tablet by mouth daily and may take an additional tablet for excess swelling 04/09/23  Yes   gabapentin (NEURONTIN) 300 MG capsule Take 300 mg by mouth at bedtime.  05/02/23  Yes [provider]  HYDROcodone-acetaminophen (NORCO/VICODIN) 5-325 MG tablet Take 2 tablets by mouth every 6 (six) hours as needed. 05/17/23  Yes Caron Presume, MD  lipase/protease/amylase (CREON) 36000 UNITS CPEP capsule Take 1 capsule (36,000 Units total) by mouth 3 (three) times daily before meals. 05/14/23  Yes Deanna Artis, DO  loperamide (IMODIUM) 2 MG capsule Take 1 capsule (2 mg total) by mouth as needed for diarrhea or loose stools. 05/14/23  Yes Deanna Artis, DO  mirtazapine (REMERON) 7.5 MG tablet Take 7.5 mg by mouth at bedtime. 04/22/23  Yes [provider]  pantoprazole (PROTONIX) 40 MG tablet Take 1 tablet (40 mg total) by mouth 2 (two) times daily. Patient taking differently: Take 40 mg by mouth daily. 12/23/21  Yes Jacalyn Lefevre, MD  rivastigmine (EXELON) 1.5 MG capsule Take 1 capsule (1.5 mg total) by mouth 2 (two) times daily. 12/27/22 07/25/23 Yes Windell Norfolk, MD  sevelamer carbonate (RENVELA) 800 MG tablet Take 1 tablet (800 mg total) by mouth 3 (three) times daily with meals. 06/01/23 07/01/23 Yes Lanae Boast, MD  sodium bicarbonate 650 MG tablet Take 1 tablet (650 mg total) by mouth 2 (two) times  daily. 06/01/23 07/01/23 Yes Lanae Boast, MD  tiZANidine (ZANAFLEX) 2 MG tablet Take 2 mg by mouth at bedtime as needed for muscle spasms. 04/09/23  Yes [provider]  nitroGLYCERIN (NITROSTAT) 0.4 MG SL tablet DISSOLVE 1 TABLET UNDER THE TONGUE EVERY 5 MINUTES AS  NEEDED FOR CHEST PAIN. MAX  OF 3 TABLETS IN 15 MINUTES. CALL 911 IF PAIN PERSISTS. Patient not taking: Reported on 06/15/2023 10/04/20   Jake Bathe, MD    Social History   Socioeconomic History   Marital status: Widowed    Spouse name: Not on file   Number of children: 5   Years of education: Not on file   Highest education level: High school graduate  Occupational History   Occupation: retired    Associate Professor: [employer]  Tobacco Use   Smoking status: Never   Smokeless tobacco: Never  Vaping Use   Vaping status: Never Used  Substance and Sexual Activity   Alcohol use: No    Alcohol/week: 0.0 standard drinks of alcohol   Drug use: No   Sexual activity: Not on file  Other Topics Concern   Not on file  Social History Narrative   She is separated 2 sons 3 daughters   She has grandchildren and great-grandchildren   She is retired   Never smoker no tobacco now no drugs no alcohol no caffeine   Right handed   Social Drivers of Corporate investment banker Strain: Low Risk  (10/25/2021)   Overall Financial Resource Strain (CARDIA)    Difficulty of Paying Living Expenses: Not very hard  Food Insecurity: No Food Insecurity (05/25/2023)   Hunger Vital Sign    Worried About Running Out of Food in the Last Year: Never true    Ran Out of Food in the Last Year: Never true  Transportation Needs: No Transportation Needs (05/25/2023)   PRAPARE - Administrator, Civil Service (Medical): No    Lack of Transportation (Non-Medical): No  Physical Activity: Not on file  Stress: Not on file  Social Connections: Patient Declined (06/19/2023)   Social Connection and Isolation Panel [NHANES]    Frequency of  Communication with Friends and Family: Patient declined    Frequency of Social Gatherings with Friends and Family: Patient declined  Attends Religious Services: Patient declined    Active Member of Clubs or Organizations: Patient declined    Attends Banker Meetings: Patient declined    Marital Status: Patient declined  Recent Concern: Social Connections - Socially Isolated (05/25/2023)   Social Connection and Isolation Panel [NHANES]    Frequency of Communication with Friends and Family: More than three times a week    Frequency of Social Gatherings with Friends and Family: Three times a week    Attends Religious Services: Never    Active Member of Clubs or Organizations: No    Attends Banker Meetings: Never    Marital Status: Separated  Intimate Partner Violence: Not At Risk (05/25/2023)   Humiliation, Afraid, Rape, and Kick questionnaire    Fear of Current or Ex-Partner: No    Emotionally Abused: No    Physically Abused: No    Sexually Abused: No     Family History  Problem Relation Age of Onset   Breast cancer Mother 13   Pancreatic cancer Brother 79   Lung cancer Maternal Aunt    Breast cancer Maternal Aunt    Kidney failure Maternal Aunt    Prostate cancer Maternal Uncle    Heart attack Maternal Grandmother    Breast cancer Maternal Grandmother    Colon cancer Maternal Grandmother    Clotting disorder Maternal Grandmother    Diabetes Maternal Grandmother    Hypertension Daughter    Pancreatic cancer Other     ROS: Otherwise negative unless mentioned in HPI  Physical Examination  Vitals:   06/19/23 0738 06/19/23 0856  BP: (!) 163/66 (!) 139/51  Pulse: 83   Resp: 15   Temp: 98.9 F (37.2 C)   SpO2: 91%    Body mass index is 28.72 kg/m.  General:  WDWN in NAD Gait: Not observed HENT: WNL, normocephalic Pulmonary: normal non-labored breathing Cardiac: regular Vascular Exam/Pulses: 2+ radial pulses, 5/5 grip strength Extremities:  without ischemic changes, without Gangrene , without cellulitis; without open wounds;  Musculoskeletal: no muscle wasting or atrophy  Neurologic: A&O X 3;  No focal weakness or paresthesias are detected; speech is fluent/normal Psychiatric:  The pt has Normal affect.  CBC    Component Value Date/Time   WBC 10.9 (H) 06/19/2023 0237   RBC 2.81 (L) 06/19/2023 0237   HGB 7.8 (L) 06/19/2023 0237   HGB 9.4 (L) 04/18/2023 1434   HGB 9.3 (L) 03/14/2017 0810   HCT 25.4 (L) 06/19/2023 0237   HCT 26.4 (L) 07/21/2019 0809   HCT 29.2 (L) 03/14/2017 0810   PLT 169 06/19/2023 0237   PLT 235 04/18/2023 1434   PLT 292 03/14/2017 0810   PLT 351 10/11/2016 1139   MCV 90.4 06/19/2023 0237   MCV 81.3 03/14/2017 0810   MCH 27.8 06/19/2023 0237   MCHC 30.7 06/19/2023 0237   RDW 18.2 (H) 06/19/2023 0237   RDW 14.0 03/14/2017 0810   LYMPHSABS 1.0 05/17/2023 1149   LYMPHSABS 1.4 03/14/2017 0810   MONOABS 0.8 05/17/2023 1149   MONOABS 0.4 03/14/2017 0810   EOSABS 0.0 05/17/2023 1149   EOSABS 0.1 03/14/2017 0810   BASOSABS 0.0 05/17/2023 1149   BASOSABS 0.0 03/14/2017 0810    BMET    Component Value Date/Time   NA 135 06/19/2023 0237   NA 144 07/19/2020 1458   NA 140 03/14/2017 0810   K 4.1 06/19/2023 0237   K 3.8 03/14/2017 0810   CL 100 06/19/2023 0237   CO2 24 06/19/2023  0237   CO2 27 03/14/2017 0810   GLUCOSE 104 (H) 06/19/2023 0237   GLUCOSE 177 (H) 03/14/2017 0810   BUN 92 (H) 06/19/2023 0237   BUN 85 (HH) 07/19/2020 1458   BUN 35.6 (H) 03/14/2017 0810   CREATININE 4.83 (H) 06/19/2023 0237   CREATININE 2.31 (H) 10/25/2022 1355   CREATININE 1.8 (H) 03/14/2017 0810   CALCIUM 8.0 (L) 06/19/2023 0237   CALCIUM 9.6 03/14/2017 0810   GFRNONAA 9 (L) 06/19/2023 0237   GFRNONAA 22 (L) 10/25/2022 1355   GFRNONAA 45 (L) 12/17/2013 1158   GFRAA 21 (L) 12/21/2019 0845   GFRAA 52 (L) 12/17/2013 1158    COAGS: Lab Results  Component Value Date   INR 1.5 (H) 07/28/2022   INR 1.3 (H)  02/03/2022   INR 1.3 (H) 11/05/2021     Non-Invasive Vascular Imaging:   Bilateral upper extremity vein mapping pending  Statin:  No. Beta Blocker:  No. Aspirin:  No. ACEI:  No. ARB:  No. CCB use:  Yes Other antiplatelets/anticoagulants:  Yes.   Eliquis   ASSESSMENT/PLAN: This is a 78 y.o. female with CKD V now progressed to ESRD in need of TDC and permanent dialysis access. She is currently being managed medically for her volume overload with IV diuretics. She will initiate dialysis once [facility] placed. Discussed with her AV fistula vs AV graft. She is right hand dominant. Her upper extremity vein mapping is pending. Should she have adequate vein will plan for left upper arm AV fistula. Risk, benefits, and alternatives to access surgery were discussed.  The patient is aware the risks include but are not limited to: bleeding, infection, steal syndrome, nerve damage, thrombosis, and need for additional procedures. The patient is agreeable and is willing to proceed. The on call vascular surgeon, Dr. Myra Gianotti will follow up after vein mapping to provide further management plans. Her Eliquis is being held as of today. Pending OR availability she can have TDC placement and LUE AV Fistula vs Graft creation on Friday 4/4    Corrina Baglia PA-C [facility] [phone] 06/19/2023  10:18 AM   I agree with the above.  I have seen and evaluated the patient.  Briefly this is a 78 year old female who has progressed to dialysis.  We have been asked to provide access.  She is right-handed.  She has a palpable left radial pulse.  She does not have a pacemaker or defibrillator.  I discussed proceeding with left sided fistula versus graft pending her vein mapping.  She will need the IV removed out of her left arm if we proceed with left arm access.  She is on Eliquis which will need to be stopped.  I have her scheduled for Friday which will be likely a left arm access and placement of a tunneled  dialysis catheter.  She will need to be n.p.o. after midnight on Thursday night.  Durene Cal

## 2023-06-19 NOTE — Plan of Care (Signed)

## 2023-06-19 NOTE — Progress Notes (Signed)
 Mobility Specialist Progress Note:   06/19/23 0910  Mobility  Activity Ambulated with assistance in hallway  Level of Assistance Contact guard assist, steadying assist  Assistive Device Front wheel walker  Distance Ambulated (ft) 200 ft  Activity Response Tolerated well  Mobility Referral Yes  Mobility visit 1 Mobility  Mobility Specialist Start Time (ACUTE ONLY) 0910  Mobility Specialist Stop Time (ACUTE ONLY) 0920  Mobility Specialist Time Calculation (min) (ACUTE ONLY) 10 min   Pt agreeable to mobility session. Required only minG assist to ambulate with RW. Pt c/o back pain and L foot pain. Pt back in chair with all needs met.   Addison Lank Mobility Specialist Please contact via SecureChat or  Rehab office at [phone]

## 2023-06-20 DIAGNOSIS — I5033 Acute on chronic diastolic (congestive) heart failure: Secondary | ICD-10-CM | POA: Diagnosis not present

## 2023-06-20 LAB — CBC
HCT: 27.7 % — ABNORMAL LOW (ref 36.0–46.0)
Hemoglobin: 8.7 g/dL — ABNORMAL LOW (ref 12.0–15.0)
MCH: 27.9 pg (ref 26.0–34.0)
MCHC: 31.4 g/dL (ref 30.0–36.0)
MCV: 88.8 fL (ref 80.0–100.0)
Platelets: 196 10*3/uL (ref 150–400)
RBC: 3.12 MIL/uL — ABNORMAL LOW (ref 3.87–5.11)
RDW: 18 % — ABNORMAL HIGH (ref 11.5–15.5)
WBC: 13.5 10*3/uL — ABNORMAL HIGH (ref 4.0–10.5)
nRBC: 0.4 % — ABNORMAL HIGH (ref 0.0–0.2)

## 2023-06-20 LAB — BASIC METABOLIC PANEL WITH GFR
Anion gap: 16 — ABNORMAL HIGH (ref 5–15)
BUN: 103 mg/dL — ABNORMAL HIGH (ref 8–23)
CO2: 22 mmol/L (ref 22–32)
Calcium: 8.5 mg/dL — ABNORMAL LOW (ref 8.9–10.3)
Chloride: 98 mmol/L (ref 98–111)
Creatinine, Ser: 4.85 mg/dL — ABNORMAL HIGH (ref 0.44–1.00)
GFR, Estimated: 9 mL/min — ABNORMAL LOW (ref >60)
Glucose, Bld: 147 mg/dL — ABNORMAL HIGH (ref 70–99)
Potassium: 4.1 mmol/L (ref 3.5–5.1)
Sodium: 136 mmol/L (ref 135–145)

## 2023-06-20 LAB — GLUCOSE, CAPILLARY
Glucose-Capillary: 110 mg/dL — ABNORMAL HIGH (ref 70–99)
Glucose-Capillary: 166 mg/dL — ABNORMAL HIGH (ref 70–99)
Glucose-Capillary: 245 mg/dL — ABNORMAL HIGH (ref 70–99)
Glucose-Capillary: 248 mg/dL — ABNORMAL HIGH (ref 70–99)

## 2023-06-20 LAB — HEPATITIS B SURFACE ANTIGEN: Hepatitis B Surface Ag: NONREACTIVE

## 2023-06-20 MED ORDER — CHLORHEXIDINE GLUCONATE CLOTH 2 % EX PADS
6.0000 | MEDICATED_PAD | Freq: Every day | CUTANEOUS | Status: DC
  Administered 2023-06-21 – 2023-07-01 (×11): 6 via TOPICAL

## 2023-06-20 MED ORDER — RENA-VITE PO TABS
1.0000 | ORAL_TABLET | Freq: Every day | ORAL | Status: DC
  Administered 2023-06-20 – 2023-07-07 (×18): 1 via ORAL
  Filled 2023-06-20 (×18): qty 1

## 2023-06-20 NOTE — Discharge Instructions (Addendum)
 Add Protein to Your Meals and Snacks Choose at least one protein food at each meal and snack to increase your daily intake. Strategies Add  cup nonfat dry milk powder or protein powder to make a high-protein milk to drink or to use in recipes that call for milk. Vanilla or peppermint extract or unsweetened cocoa powder could help to boost the flavor. Add hard-cooked eggs, leftover meat, grated cheese, canned beans or tofu to noodles, rice, salads, sandwiches, soups, casseroles, pasta, tuna and other mixed dishes. Add powdered milk or protein powder to hot cereals, meatloaf, casseroles, scrambled eggs, sauces, cream soups, and shakes. Add beans and lentils to salads, soups, casseroles, and vegetable dishes. Eat cottage cheese or yogurt, especially Greek yogurt, with fruit as a snack or dessert. Eat peanut or other nut butters on crackers, bread, toast, waffles, apples, bananas or celery sticks. Add it to milkshakes, smoothies, or desserts. Consider a ready-made protein shake. Your RDN will make recommendations.    [facility]  Discharge Instructions  AV Graft Removal  Please refer to the following instructions for your post-procedure care. Your surgeon or physician assistant will discuss any changes with you.  Activity  You may drive the day following your surgery, if you are comfortable and no longer taking prescription pain medication. Resume full activity as the soreness in your incision resolves.  Bathing/Showering  You may shower after you go home. Keep your incision dry for 48 hours. Do not soak in a bathtub, hot tub, or swim until the incision heals completely. You may not shower if you have a hemodialysis catheter.  Incision Care  Clean your incision with mild soap and water after 48 hours. Pat the area dry with a clean towel. You do not need a bandage unless otherwise instructed. Do not apply any ointments or creams to your incision. You may  have skin glue on your incision. Do not peel it off. It will come off on its own in about one week. Your arm may swell a bit after surgery. To reduce swelling use pillows to elevate your arm so it is above your heart. Your doctor will tell you if you need to lightly wrap your arm with an ACE bandage.  Diet  Resume your normal diet. There are not special food restrictions following this procedure. In order to heal from your surgery, it is CRITICAL to get adequate nutrition. Your body requires vitamins, minerals, and protein. Vegetables are the best source of vitamins and minerals. Vegetables also provide the perfect balance of protein. Processed food has little nutritional value, so try to avoid this.  Medications  Resume taking all of your medications. If your incision is causing pain, you may take over-the counter pain relievers such as acetaminophen (Tylenol). If you were prescribed a stronger pain medication, please be aware these medications can cause nausea and constipation. Prevent nausea by taking the medication with a snack or meal. Avoid constipation by drinking plenty of fluids and eating foods with high amount of fiber, such as fruits, vegetables, and grains.  Do not take Tylenol if you are taking prescription pain medications.  Follow up Your surgeon may want to see you in the office following your access surgery. If so, this will be arranged at the time of your surgery.   Reduce your risk of vascular disease:  Stop smoking. If you would like help, call [facility] at 1-800-QUIT-NOW (802-056-1569) or [place] at (332)790-8224  Manage your cholesterol Maintain a desired weight  Control your diabetes Keep your blood pressure down    07/03/2023 [name] [id number] 1946-02-20  Surgeon(s): Young Hensen, MD  Procedure(s): REMOVAL OF ARTERIOVENOUS GORETEX GRAFT (AVGG) REPAIR, ARTERY, BRACHIAL USING VEIN PATCH   If you have any questions, please call the  office at [phone].

## 2023-06-20 NOTE — Plan of Care (Signed)

## 2023-06-20 NOTE — Progress Notes (Signed)
 Requested to see pt for out-pt HD needs at d/c. Met with pt at bedside. Introduced self and explained role. Discussed out-pt HD options. Pt voices interest in TCU for HD education. Pt agreeable to 4x's a week treatments for 2-4 weeks per program (Mon, [place], [place], Fri). Referral submitted to [facility] admissions this afternoon for review. Pt states family will assist with transportation to HD appts at d/c. Will assist as needed.   Olivia Canter Renal Navigator [phone]

## 2023-06-20 NOTE — Progress Notes (Signed)
 Mobility Specialist Progress Note:    06/20/23 1414  Mobility  Activity Ambulated with assistance in hallway  Level of Assistance Standby assist, set-up cues, supervision of patient - no hands on  Assistive Device Front wheel walker  Distance Ambulated (ft) 210 ft  Activity Response Tolerated well  Mobility Referral Yes  Mobility visit 1 Mobility  Mobility Specialist Start Time (ACUTE ONLY) 0912  Mobility Specialist Stop Time (ACUTE ONLY) [id number]  Mobility Specialist Time Calculation (min) (ACUTE ONLY) 15 min   Received pt in chair having no complaints and agreeable to mobility. C/o mild back pain during ambulation otherwise no c/o. Returned to room w/o fault. Left in chair w/ call bell in reach and all needs met.   Thompson Grayer Mobility Specialist  Please contact vis Secure Chat or  Rehab Office [phone]

## 2023-06-20 NOTE — Progress Notes (Addendum)
 PROGRESS NOTE    [name]  JWJ:[id number] DOB: 12-Apr-1945 DOA: 06/15/2023 PCP: Renford Dills, MD  78/F with CAD, chronic diastolic CHF, CKD 4, history of DVT, renal vein thrombosis on Eliquis, CVA, depression, memory loss, type 2 diabetes mellitus, history of breast cancer, lupus was admitted 3/8-15 with volume overload, progressive CKD, followed by nephrology, diuresed and discharged home on oral diuretics, also seen by gastroenterology for worsening chronic anemia, EGD and colonoscopy were unremarkable, Eliquis resumed at discharge. -Back in the ED 3/29 with progressive dyspnea on exertion orthopnea lower extremity edema and weight gain. -In the ED hypertensive, otherwise vital signs stable, hemoglobin 7.0, creatinine 4.0, BUN 82, BNP 560, troponin 125, chest x-ray unremarkable -Admitted, started on IV diuretics, nephrology following -4/2, vascular surgery consult, plan to start HD  Subjective: -Feels fair, breathing a little better today  Assessment and Plan:  Acute on chronic HFpEF Progressive CKD stage 4/5 -Recent hospitalization for same  -Echo 3/25 with EF 60-65, grade 1 DD, normal RV  -Diuresing on IV Lasix, 6.8 L negative, limited response to oral diuretics, with advanced CKD, plan to start HD tomorrow -Nephrology following -Hold Eliquis, resume tomorrow evening   Anemia of chronic disease, iron deficiency -Underwent GI workup few weeks ago which was unremarkable -Baseline hemoglobin 8-10, lower this admission, transfused 1 unit PRBC 3/30 -Needs Aranesp, given IV iron, improving  Left foot, gout flare -Uric acid is 12.3, improving, continue prednisone for 2 more days  Leukocytosis, likely secondary to steroids   CAD status post PCI in 2000 EKG without acute ischemic changes.  Stable   Hypertension -Continue amlodipine.   History of renal vein thrombosis 09/2017 and DVT 06/2020 -Hold Eliquis, will resume after vascular surgeries   Depression Mild  Dementia -Continue fluoxetine, rivastigmine   Type 2 diabetes A1c 6.2 on 05/08/2023.  Sensitive sliding scale   Chronic pain/neuropathy Continue gabapentin.   GERD/gastritis Continue Protonix.   DVT prophylaxis: Eliquis> now held Code Status: Full code Family Communication: No family at bedside today Disposition Plan: Home likely 2 to 3 days  Consultants:    Procedures:   Antimicrobials:    Objective: Vitals:   06/19/23 2125 06/20/23 0010 06/20/23 0529 06/20/23 0730  BP: (!) 155/65 (!) 153/63 (!) 169/68   Pulse: 76 78 79   Resp: 16 17 17    Temp: 98.1 F (36.7 C) 98 F (36.7 C) 98.1 F (36.7 C)   TempSrc: Oral Oral Oral Oral  SpO2: 96% 92% 100%   Weight:   78 kg   Height:        Intake/Output Summary (Last 24 hours) at 06/20/2023 0955 Last data filed at 06/20/2023 0820 Gross per 24 hour  Intake 1080 ml  Output 2725 ml  Net -1645 ml   Filed Weights   06/18/23 0514 06/19/23 0531 06/20/23 0529  Weight: 67.6 kg 75.9 kg 78 kg    Examination:  Gen: Awake, Alert, Oriented X 2, mild cognitive deficits HEENT:+ JVD Lungs: Decreased breath sounds at the bases CVS: S1S2/RRR Abd: soft, Non tender, non distended, BS present Extremities: 1+ edema Skin: no new rashes on exposed skin     Data Reviewed:   CBC: Recent Labs  Lab 06/16/23 1613 06/17/23 0330 06/18/23 0334 06/19/23 0237 06/20/23 0254  WBC 11.9* 10.7* 10.5 10.9* 13.5*  HGB 9.0* 8.7* 8.1* 7.8* 8.7*  HCT 28.4* 27.8* 26.1* 25.4* 27.7*  MCV 89.3 89.4 89.4 90.4 88.8  PLT 192 174 177 169 196   Basic Metabolic Panel: Recent Labs  Lab 06/16/23 0301 06/17/23 0330 06/18/23 0334 06/19/23 0237 06/20/23 0254  NA 141 140 140 135 136  K 4.6 4.7 4.3 4.1 4.1  CL 110 105 104 100 98  CO2 24 25 25 24 22   GLUCOSE 110* 95 103* 104* 147*  BUN 81* 81* 84* 92* 103*  CREATININE 3.88* 3.85* 4.30* 4.83* 4.85*  CALCIUM 8.4* 8.8* 8.4*  8.3* 8.0* 8.5*  PHOS  --  4.1  --   --   --    GFR: Estimated Creatinine  Clearance: 10.1 mL/min (A) (by C-G formula based on SCr of 4.85 mg/dL (H)). Liver Function Tests: No results for input(s): "AST", "ALT", "ALKPHOS", "BILITOT", "PROT", "ALBUMIN" in the last 168 hours. No results for input(s): "LIPASE", "AMYLASE" in the last 168 hours. No results for input(s): "AMMONIA" in the last 168 hours. Coagulation Profile: No results for input(s): "INR", "PROTIME" in the last 168 hours. Cardiac Enzymes: No results for input(s): "CKTOTAL", "CKMB", "CKMBINDEX", "TROPONINI" in the last 168 hours. BNP (last 3 results) No results for input(s): "PROBNP" in the last 8760 hours. HbA1C: No results for input(s): "HGBA1C" in the last 72 hours. CBG: Recent Labs  Lab 06/19/23 0532 06/19/23 1052 06/19/23 1652 06/19/23 2122 06/20/23 0632  GLUCAP 75 117* 217* 201* 110*   Lipid Profile: No results for input(s): "CHOL", "HDL", "LDLCALC", "TRIG", "CHOLHDL", "LDLDIRECT" in the last 72 hours. Thyroid Function Tests: No results for input(s): "TSH", "T4TOTAL", "FREET4", "T3FREE", "THYROIDAB" in the last 72 hours. Anemia Panel: No results for input(s): "VITAMINB12", "FOLATE", "FERRITIN", "TIBC", "IRON", "RETICCTPCT" in the last 72 hours.  Urine analysis:    Component Value Date/Time   COLORURINE STRAW (A) 05/26/2023 1610   APPEARANCEUR CLEAR 05/26/2023 1610   APPEARANCEUR Clear 08/22/2018 1620   LABSPEC 1.008 05/26/2023 1610   LABSPEC 1.020 03/03/2014 1341   PHURINE 5.0 05/26/2023 1610   GLUCOSEU NEGATIVE 05/26/2023 1610   GLUCOSEU Negative 03/03/2014 1341   HGBUR SMALL (A) 05/26/2023 1610   BILIRUBINUR NEGATIVE 05/26/2023 1610   BILIRUBINUR Negative 08/22/2018 1620   BILIRUBINUR Negative 03/03/2014 1341   KETONESUR NEGATIVE 05/26/2023 1610   PROTEINUR 100 (A) 05/26/2023 1610   UROBILINOGEN 0.2 03/03/2014 1341   NITRITE NEGATIVE 05/26/2023 1610   LEUKOCYTESUR NEGATIVE 05/26/2023 1610   LEUKOCYTESUR Negative 03/03/2014 1341   Sepsis  Labs: @LABRCNTIP (procalcitonin:4,lacticidven:4)  )No results found for this or any previous visit (from the past 240 hours).   Radiology Studies: VAS [place] UPPER EXT VEIN MAPPING (PRE-OP AVF) Result Date: 06/19/2023 UPPER EXTREMITY VEIN MAPPING Patient Name:  [name]  Date of Exam:   06/19/2023 Medical Rec #: [id number]           Accession #:    [id number] Date of Birth: June 07, 1945           Patient Gender: F Patient Age:   49 years Exam Location:  [facility] Procedure:      VAS [place] UPPER EXT VEIN MAPPING (PRE-OP AVF) Referring Phys: Estill Bakes --------------------------------------------------------------------------------  Indications: Pre-access. Comparison Study: None. Performing Technologist: Shona Simpson  Examination Guidelines: A complete evaluation includes B-mode imaging, spectral Doppler, color Doppler, and power Doppler as needed of all accessible portions of each vessel. Bilateral testing is considered an integral part of a complete examination. Limited examinations for reoccurring indications may be performed as noted. +-----------------+-------------+----------+--------------+ Right Cephalic   Diameter (cm)Depth (cm)   Findings    +-----------------+-------------+----------+--------------+ Shoulder             0.25  1.22                  +-----------------+-------------+----------+--------------+ Prox upper arm       0.22        0.87     branching    +-----------------+-------------+----------+--------------+ Mid upper arm        0.22        0.45                  +-----------------+-------------+----------+--------------+ Dist upper arm       0.15        0.34                  +-----------------+-------------+----------+--------------+ Antecubital fossa    0.26        0.27                  +-----------------+-------------+----------+--------------+ Prox forearm         0.18        0.25                   +-----------------+-------------+----------+--------------+ Mid forearm          0.07        0.24                  +-----------------+-------------+----------+--------------+ Dist forearm                            not visualized +-----------------+-------------+----------+--------------+ Wrist                                   not visualized +-----------------+-------------+----------+--------------+ +-----------------+-------------+----------+--------------+ Right Basilic    Diameter (cm)Depth (cm)   Findings    +-----------------+-------------+----------+--------------+ Shoulder                                not visualized +-----------------+-------------+----------+--------------+ Prox upper arm                          not visualized +-----------------+-------------+----------+--------------+ Mid upper arm                           not visualized +-----------------+-------------+----------+--------------+ Dist upper arm       0.31        1.53                  +-----------------+-------------+----------+--------------+ Antecubital fossa    0.32        0.69     branching    +-----------------+-------------+----------+--------------+ Prox forearm         0.25        0.17                  +-----------------+-------------+----------+--------------+ Mid forearm          0.25        0.26                  +-----------------+-------------+----------+--------------+ Distal forearm       0.25        0.22                  +-----------------+-------------+----------+--------------+ Wrist                0.18  0.18                  +-----------------+-------------+----------+--------------+ +-----------------+-------------+----------+-----------------------------+ Left Cephalic    Diameter (cm)Depth (cm)          Findings            +-----------------+-------------+----------+-----------------------------+ Shoulder             0.32        1.44                                  +-----------------+-------------+----------+-----------------------------+ Prox upper arm       0.29        0.84                                 +-----------------+-------------+----------+-----------------------------+ Mid upper arm        0.25        0.36             branching           +-----------------+-------------+----------+-----------------------------+ Dist upper arm       0.25        0.39             branching           +-----------------+-------------+----------+-----------------------------+ Antecubital fossa    0.25        0.37                                 +-----------------+-------------+----------+-----------------------------+ Prox forearm         0.21        0.46             branching           +-----------------+-------------+----------+-----------------------------+ Mid forearm          0.12        0.57             branching           +-----------------+-------------+----------+-----------------------------+ Dist forearm                            not visualized and obstructed +-----------------+-------------+----------+-----------------------------+ Wrist                                   not visualized and obstructed +-----------------+-------------+----------+-----------------------------+ +-----------------+-------------+----------+--------------+ Left Basilic     Diameter (cm)Depth (cm)   Findings    +-----------------+-------------+----------+--------------+ Shoulder                                not visualized +-----------------+-------------+----------+--------------+ Prox upper arm                          not visualized +-----------------+-------------+----------+--------------+ Mid upper arm                           not visualized +-----------------+-------------+----------+--------------+ Dist upper arm       0.32        1.20                   +-----------------+-------------+----------+--------------+  Antecubital fossa    1.70        0.50     branching    +-----------------+-------------+----------+--------------+ Prox forearm         0.15        0.44     branching    +-----------------+-------------+----------+--------------+ Mid forearm          0.15        0.30                  +-----------------+-------------+----------+--------------+ Distal forearm       0.09        1.74                  +-----------------+-------------+----------+--------------+ Wrist                0.13        0.26                  +-----------------+-------------+----------+--------------+ *See table(s) above for measurements and observations.  Diagnosing physician: Coral Else MD Electronically signed by Coral Else MD on 06/19/2023 at 7:00:51 PM.    Final       Scheduled Meds:  amLODipine  10 mg Oral Daily   colestipol  1 g Oral TID   FLUoxetine  20 mg Oral Daily   folic acid  1 mg Oral Daily   furosemide  80 mg Intravenous TID   gabapentin  300 mg Oral QHS   insulin aspart  0-5 Units Subcutaneous QHS   insulin aspart  0-6 Units Subcutaneous TID WC   lipase/protease/amylase  36,000 Units Oral TID AC   mirtazapine  7.5 mg Oral QHS   pantoprazole  40 mg Oral BID   polyethylene glycol  17 g Oral Daily   predniSONE  40 mg Oral Q breakfast   rivastigmine  1.5 mg Oral BID   sevelamer carbonate  800 mg Oral TID WC   Continuous Infusions:     LOS: 4 days    Time spent:    Zannie Cove, MD [facility]   06/20/2023, 9:55 AM

## 2023-06-20 NOTE — Progress Notes (Addendum)
 Initial Nutrition Assessment  DOCUMENTATION CODES:   Not applicable  INTERVENTION:  Liberalize diet to promote PO intake  Rena-vite daily.  Nutrition education for HD. Handouts left at bedside.   NUTRITION DIAGNOSIS:   Increased nutrient needs related to other (see comment) (New ESRD/HD) as evidenced by estimated needs.  GOAL:   Patient will meet greater than or equal to 90% of their needs  MONITOR:   PO intake, Supplement acceptance  REASON FOR ASSESSMENT:   Consult Assessment of nutrition requirement/status  ASSESSMENT:   PMH of HTN, CAD, CKD stage 5, DVT, chronic HFpEF, memory loss, T2DM, HLD, gout, SBO, history of breast cancer, lupus. Patient was recently admitted 3/8-3/15 for volume overload/acute on chronic HFpEF/progressive CKD stage V versus AKI on CKD. Pt admitted for worsening dyspnea on exertion, bilateral lower extremity edema, and 20# weight gain since last admission.  Nursing flowsheets document meal completions between 50-100%. Pt reports good appetite and intake since admission as well as at home previous to admission. Pt reports cooking for self at home and cooks vegetables. Pt reports not eating breakfast but has been eating breakfast since being in the hospital. Pt declined ensure and reported staying away from dairy like products because they tend to give pt diarrhea.   Medications reviewed and include: folic acid, lasix, SSI 0.6 units, protonix, Miralax.  Labs reviewed: A1c 6.2%, CBGs 75-217 x 24h, BUN 103 H, Calcium ionized 1.0 L   Intake/Output Summary (Last 24 hours) at 06/20/2023 1156 Last data filed at 06/20/2023 1039 Gross per 24 hour  Intake 600 ml  Output 3025 ml  Net -2425 ml    Weights reviewed. Admit weight 79 kg. Current weight 78 kg.   NUTRITION - FOCUSED PHYSICAL EXAM: **significant edema may alter results  Flowsheet Row Most Recent Value  Orbital Region No depletion  Upper Arm Region Mild depletion  Thoracic and Lumbar Region  No depletion  Buccal Region Mild depletion  Temple Region Mild depletion  Clavicle Bone Region No depletion  Clavicle and Acromion Bone Region No depletion  Scapular Bone Region No depletion  Dorsal Hand No depletion  Patellar Region No depletion  Anterior Thigh Region No depletion  Posterior Calf Region No depletion  Edema (RD Assessment) Moderate  Hair Reviewed  Eyes Reviewed  Mouth Reviewed  Skin Reviewed  Nails Reviewed       Diet Order:   Diet Order             Diet NPO time specified  Diet effective midnight           Diet heart healthy/carb modified Room service appropriate? Yes; Fluid consistency: Thin; Fluid restriction: 1200 mL Fluid  Diet effective now                   EDUCATION NEEDS:   Education needs have been addressed  Skin:  Skin Assessment: Reviewed RN Assessment  Last BM:  4/2  Height:   Ht Readings from Last 1 Encounters:  06/15/23 5\' 4"  (1.626 m)    Weight:   Wt Readings from Last 1 Encounters:  06/20/23 78 kg    Ideal Body Weight:     BMI:  Body mass index is 29.52 kg/m.  Estimated Nutritional Needs:   Kcal:  1600-1800  Protein:  80-100  Fluid:  >1.6 L or per MD   Kathrynn Speed, MPH, RD, LDN Clinical Dietitian Contact information can be found at [facility].

## 2023-06-20 NOTE — Progress Notes (Signed)
 [facility] NEPHROLOGY PROGRESS NOTE  Assessment/ Plan: Pt is a 78 y.o. yo female  with history of hypertension, renal vein thrombosis on Eliquis, stroke, type II DM, lupus, CKD 4-5 who was recently admitted for CHF exacerbation and progressive CKD presented back with worsening dyspnea on exertion and fluid overload.  I had discussed with the patient and her daughter about dialysis during recent hospitalization and made plan to manage medically with diuretics and outpatient follow-up.  Her daughter works at [employer].  # Progressive CKD 5 versus AKI on CKD,: With frequent hospitalization for fluid overload.  Responding to IV diuretics but not po and with worsening renal function d/w patient and family (Daughter on phone 4/2) at length recommend starting dialysis.  She is agreeable.  Vein map,  VVS for TDC + perm access planned tomorrow - appreciated.  In the meantime cont IV diuretics.  CLIP to outpt HD - SW aware.     # CKD-MBD/hyperphosphatemia: Started sevelamer in the recent visit, monitor lab.  Checking PTH 220.   #Anemia of CKD: Concern about GI bleed, underwent endoscopy t recently with no sign of bleeding.  Back on Eliquis.  Hb was 6.4 and now 9 > 8.7 > 8.1 > 8.7 after transfusion.  Recent iron saturation 35% but down to 15% so redosed.  S/p aranesp 100 3/30.  Was receiving ESA outpt through Dr Mosetta Putt but due to repeated admission it's been since 1/30.    # Hypertension/volume: diuresing.  Remains hypertensive.  Cont current meds while diuresing   #A fib: on eliquis - on hold in prep for AV access surgery.   We will continue to follow.  Subjective: Seen and examined ambulating with PD.  Improved UOP with IV lasix. No family present today.    No frank uremic.  No LUTs.    Objective Vital signs in last 24 hours: Vitals:   06/20/23 0529 06/20/23 0730 06/20/23 0946 06/20/23 1115  BP: (!) 169/68  (!) 170/69 (!) 170/79  Pulse: 79   84  Resp: 17   (!) 21  Temp: 98.1 F (36.7 C)    98.1 F (36.7 C)  TempSrc: Oral Oral  Oral  SpO2: 100%   99%  Weight: 78 kg     Height:       Weight change: 2.132 kg  Intake/Output Summary (Last 24 hours) at 06/20/2023 1146 Last data filed at 06/20/2023 1039 Gross per 24 hour  Intake 600 ml  Output 3025 ml  Net -2425 ml       Labs: RENAL PANEL Recent Labs  Lab 06/16/23 0301 06/17/23 0330 06/18/23 0334 06/19/23 0237 06/20/23 0254  NA 141 140 140 135 136  K 4.6 4.7 4.3 4.1 4.1  CL 110 105 104 100 98  CO2 24 25 25 24 22   GLUCOSE 110* 95 103* 104* 147*  BUN 81* 81* 84* 92* 103*  CREATININE 3.88* 3.85* 4.30* 4.83* 4.85*  CALCIUM 8.4* 8.8* 8.4*  8.3* 8.0* 8.5*  PHOS  --  4.1  --   --   --     Liver Function Tests: No results for input(s): "AST", "ALT", "ALKPHOS", "BILITOT", "PROT", "ALBUMIN" in the last 168 hours. No results for input(s): "LIPASE", "AMYLASE" in the last 168 hours. No results for input(s): "AMMONIA" in the last 168 hours. CBC: Recent Labs    01/31/23 1437 02/13/23 1332 02/28/23 1411 03/14/23 1450 04/04/23 1445 04/04/23 1446 04/18/23 1433 04/18/23 1434 05/08/23 1833 05/13/23 4098 05/13/23 0535 05/14/23 0802 05/28/23 0459 05/29/23  1610 06/16/23 1613 06/17/23 0330 06/18/23 0334 06/19/23 0237 06/20/23 0254  HGB 10.2*   < > 9.9*   < > 9.9*  --   --  9.4*   < >  --   --    < > 7.9*   < > 9.0* 8.7* 8.1* 7.8* 8.7*  MCV 87.7   < > 85.2   < > 85.9  --   --  86.4   < >  --   --    < > 87.1   < > 89.3 89.4 89.4 90.4 88.8  VITAMINB12 1,098*  --  833  --  700  --   --  835  --  1,040*  --   --   --   --   --   --   --   --   --   FOLATE  --   --   --   --   --   --   --   --   --   --  5.3*  --   --   --   --   --   --   --   --   FERRITIN  --   --   --   --   --  153 226  --   --  220  --   --  246  --   --  192  --   --   --   TIBC  --   --   --   --   --   --   --   --   --  206*  --   --  207*  --   --  267  --   --   --   IRON  --   --   --   --   --   --   --   --   --  46  --   --  73  --    --  40  --   --   --   RETICCTPCT  --   --   --   --   --   --   --   --   --  1.5  --   --   --   --   --   --   --   --   --    < > = values in this interval not displayed.    Cardiac Enzymes: No results for input(s): "CKTOTAL", "CKMB", "CKMBINDEX", "TROPONINI" in the last 168 hours. CBG: Recent Labs  Lab 06/19/23 0532 06/19/23 1052 06/19/23 1652 06/19/23 2122 06/20/23 0632  GLUCAP 75 117* 217* 201* 110*    Iron Studies:  No results for input(s): "IRON", "TIBC", "TRANSFERRIN", "FERRITIN" in the last 72 hours.  Studies/Results: VAS [place] UPPER EXT VEIN MAPPING (PRE-OP AVF) Result Date: 06/19/2023 UPPER EXTREMITY VEIN MAPPING Patient Name:  [name]  Date of Exam:   06/19/2023 Medical Rec #: [id number]           Accession #:    [id number] Date of Birth: [birth year]           Patient Gender: F Patient Age:   89 years Exam Location:  [facility] Procedure:      VAS [place] UPPER EXT VEIN MAPPING (PRE-OP AVF) Referring Phys: Estill Bakes --------------------------------------------------------------------------------  Indications: Pre-access. Comparison Study: None. Performing Technologist: Shona Simpson  Examination Guidelines: A  complete evaluation includes B-mode imaging, spectral Doppler, color Doppler, and power Doppler as needed of all accessible portions of each vessel. Bilateral testing is considered an integral part of a complete examination. Limited examinations for reoccurring indications may be performed as noted. +-----------------+-------------+----------+--------------+ Right Cephalic   Diameter (cm)Depth (cm)   Findings    +-----------------+-------------+----------+--------------+ Shoulder             0.25        1.22                  +-----------------+-------------+----------+--------------+ Prox upper arm       0.22        0.87     branching    +-----------------+-------------+----------+--------------+ Mid upper arm        0.22        0.45                   +-----------------+-------------+----------+--------------+ Dist upper arm       0.15        0.34                  +-----------------+-------------+----------+--------------+ Antecubital fossa    0.26        0.27                  +-----------------+-------------+----------+--------------+ Prox forearm         0.18        0.25                  +-----------------+-------------+----------+--------------+ Mid forearm          0.07        0.24                  +-----------------+-------------+----------+--------------+ Dist forearm                            not visualized +-----------------+-------------+----------+--------------+ Wrist                                   not visualized +-----------------+-------------+----------+--------------+ +-----------------+-------------+----------+--------------+ Right Basilic    Diameter (cm)Depth (cm)   Findings    +-----------------+-------------+----------+--------------+ Shoulder                                not visualized +-----------------+-------------+----------+--------------+ Prox upper arm                          not visualized +-----------------+-------------+----------+--------------+ Mid upper arm                           not visualized +-----------------+-------------+----------+--------------+ Dist upper arm       0.31        1.53                  +-----------------+-------------+----------+--------------+ Antecubital fossa    0.32        0.69     branching    +-----------------+-------------+----------+--------------+ Prox forearm         0.25        0.17                  +-----------------+-------------+----------+--------------+ Mid forearm          0.25  0.26                  +-----------------+-------------+----------+--------------+ Distal forearm       0.25        0.22                  +-----------------+-------------+----------+--------------+ Wrist                 0.18        0.18                  +-----------------+-------------+----------+--------------+ +-----------------+-------------+----------+-----------------------------+ Left Cephalic    Diameter (cm)Depth (cm)          Findings            +-----------------+-------------+----------+-----------------------------+ Shoulder             0.32        1.44                                 +-----------------+-------------+----------+-----------------------------+ Prox upper arm       0.29        0.84                                 +-----------------+-------------+----------+-----------------------------+ Mid upper arm        0.25        0.36             branching           +-----------------+-------------+----------+-----------------------------+ Dist upper arm       0.25        0.39             branching           +-----------------+-------------+----------+-----------------------------+ Antecubital fossa    0.25        0.37                                 +-----------------+-------------+----------+-----------------------------+ Prox forearm         0.21        0.46             branching           +-----------------+-------------+----------+-----------------------------+ Mid forearm          0.12        0.57             branching           +-----------------+-------------+----------+-----------------------------+ Dist forearm                            not visualized and obstructed +-----------------+-------------+----------+-----------------------------+ Wrist                                   not visualized and obstructed +-----------------+-------------+----------+-----------------------------+ +-----------------+-------------+----------+--------------+ Left Basilic     Diameter (cm)Depth (cm)   Findings    +-----------------+-------------+----------+--------------+ Shoulder                                not visualized  +-----------------+-------------+----------+--------------+ Prox upper arm  not visualized +-----------------+-------------+----------+--------------+ Mid upper arm                           not visualized +-----------------+-------------+----------+--------------+ Dist upper arm       0.32        1.20                  +-----------------+-------------+----------+--------------+ Antecubital fossa    1.70        0.50     branching    +-----------------+-------------+----------+--------------+ Prox forearm         0.15        0.44     branching    +-----------------+-------------+----------+--------------+ Mid forearm          0.15        0.30                  +-----------------+-------------+----------+--------------+ Distal forearm       0.09        1.74                  +-----------------+-------------+----------+--------------+ Wrist                0.13        0.26                  +-----------------+-------------+----------+--------------+ *See table(s) above for measurements and observations.  Diagnosing physician: Coral Else MD Electronically signed by Coral Else MD on 06/19/2023 at 7:00:51 PM.    Final      Medications: Infusions:   Scheduled Medications:  amLODipine  10 mg Oral Daily   colestipol  1 g Oral TID   FLUoxetine  20 mg Oral Daily   folic acid  1 mg Oral Daily   furosemide  80 mg Intravenous TID   gabapentin  300 mg Oral QHS   insulin aspart  0-5 Units Subcutaneous QHS   insulin aspart  0-6 Units Subcutaneous TID WC   lipase/protease/amylase  36,000 Units Oral TID AC   mirtazapine  7.5 mg Oral QHS   pantoprazole  40 mg Oral BID   polyethylene glycol  17 g Oral Daily   predniSONE  40 mg Oral Q breakfast   rivastigmine  1.5 mg Oral BID   sevelamer carbonate  800 mg Oral TID WC    have reviewed scheduled and prn medications.  Physical Exam: General:NAD, comfortable Heart:RRR, s1s2 nl Lungs:clear b/l, no  crackle Abdomen:soft, Non-tender, non-distended Extremities: BL 1+ pitting edema Neurology: Alert awake and following commands  Tyler Pita 06/20/2023,11:46 AM  LOS: 4 days

## 2023-06-20 NOTE — Progress Notes (Signed)
  Progress Note    06/20/2023 7:07 AM * No surgery found *  Subjective:  sleeping when I entered room   Vitals:   06/20/23 0010 06/20/23 0529  BP: (!) 153/63 (!) 169/68  Pulse: 78 79  Resp: 17 17  Temp: 98 F (36.7 C) 98.1 F (36.7 C)  SpO2: 92% 100%   Physical Exam: Extremities: left upper arm well perfused and warm, 2+ radial pulse Neurologic: alert and oriented  CBC    Component Value Date/Time   WBC 13.5 (H) 06/20/2023 0254   RBC 3.12 (L) 06/20/2023 0254   HGB 8.7 (L) 06/20/2023 0254   HGB 9.4 (L) 04/18/2023 1434   HGB 9.3 (L) 03/14/2017 0810   HCT 27.7 (L) 06/20/2023 0254   HCT 26.4 (L) 07/21/2019 0809   HCT 29.2 (L) 03/14/2017 0810   PLT 196 06/20/2023 0254   PLT 235 04/18/2023 1434   PLT 292 03/14/2017 0810   PLT 351 10/11/2016 1139   MCV 88.8 06/20/2023 0254   MCV 81.3 03/14/2017 0810   MCH 27.9 06/20/2023 0254   MCHC 31.4 06/20/2023 0254   RDW 18.0 (H) 06/20/2023 0254   RDW 14.0 03/14/2017 0810   LYMPHSABS 1.0 05/17/2023 1149   LYMPHSABS 1.4 03/14/2017 0810   MONOABS 0.8 05/17/2023 1149   MONOABS 0.4 03/14/2017 0810   EOSABS 0.0 05/17/2023 1149   EOSABS 0.1 03/14/2017 0810   BASOSABS 0.0 05/17/2023 1149   BASOSABS 0.0 03/14/2017 0810    BMET    Component Value Date/Time   NA 136 06/20/2023 0254   NA 144 07/19/2020 1458   NA 140 03/14/2017 0810   K 4.1 06/20/2023 0254   K 3.8 03/14/2017 0810   CL 98 06/20/2023 0254   CO2 22 06/20/2023 0254   CO2 27 03/14/2017 0810   GLUCOSE 147 (H) 06/20/2023 0254   GLUCOSE 177 (H) 03/14/2017 0810   BUN 103 (H) 06/20/2023 0254   BUN 85 (HH) 07/19/2020 1458   BUN 35.6 (H) 03/14/2017 0810   CREATININE 4.85 (H) 06/20/2023 0254   CREATININE 2.31 (H) 10/25/2022 1355   CREATININE 1.8 (H) 03/14/2017 0810   CALCIUM 8.5 (L) 06/20/2023 0254   CALCIUM 8.3 (L) 06/18/2023 0334   CALCIUM 9.6 03/14/2017 0810   GFRNONAA 9 (L) 06/20/2023 0254   GFRNONAA 22 (L) 10/25/2022 1355   GFRNONAA 45 (L) 12/17/2013 1158    GFRAA 21 (L) 12/21/2019 0845   GFRAA 52 (L) 12/17/2013 1158    INR    Component Value Date/Time   INR 1.5 (H) 07/28/2022 1615     Intake/Output Summary (Last 24 hours) at 06/20/2023 0707 Last data filed at 06/20/2023 0541 Gross per 24 hour  Intake 998 ml  Output 2725 ml  Net -1727 ml     Assessment/Plan:  78 y.o. female with CKD V now progressed to ESRD in need of TDC and permanent access  Duplex shows marginal venous conduit in the LUE. Possible cephalic vein. We will re image in the OR. If it appears inadequate may need LUE AV graft Reviewed surgery with patient and answered her questions NPO after midnight Consent ordered    Graceann Congress, PA-C [facility] [phone] 06/20/2023 7:07 AM

## 2023-06-21 ENCOUNTER — Encounter (HOSPITAL_COMMUNITY)
Admission: EM | Disposition: A | Discharge status: Home / Self Care | Payer: Self-pay | Source: Home / Self Care | Attending: Internal Medicine

## 2023-06-21 ENCOUNTER — Inpatient Hospital Stay (HOSPITAL_COMMUNITY): Payer: Self-pay | Admitting: Anesthesiology

## 2023-06-21 ENCOUNTER — Inpatient Hospital Stay (HOSPITAL_COMMUNITY)

## 2023-06-21 ENCOUNTER — Other Ambulatory Visit: Payer: Self-pay

## 2023-06-21 ENCOUNTER — Encounter (HOSPITAL_COMMUNITY): Payer: Self-pay | Admitting: Internal Medicine

## 2023-06-21 DIAGNOSIS — Z992 Dependence on renal dialysis: Secondary | ICD-10-CM

## 2023-06-21 DIAGNOSIS — I132 Hypertensive heart and chronic kidney disease with heart failure and with stage 5 chronic kidney disease, or end stage renal disease: Secondary | ICD-10-CM | POA: Diagnosis not present

## 2023-06-21 DIAGNOSIS — I509 Heart failure, unspecified: Secondary | ICD-10-CM | POA: Diagnosis not present

## 2023-06-21 DIAGNOSIS — N186 End stage renal disease: Secondary | ICD-10-CM

## 2023-06-21 DIAGNOSIS — N185 Chronic kidney disease, stage 5: Secondary | ICD-10-CM | POA: Diagnosis not present

## 2023-06-21 DIAGNOSIS — E1122 Type 2 diabetes mellitus with diabetic chronic kidney disease: Secondary | ICD-10-CM

## 2023-06-21 DIAGNOSIS — I5033 Acute on chronic diastolic (congestive) heart failure: Secondary | ICD-10-CM | POA: Diagnosis not present

## 2023-06-21 HISTORY — PX: INSERTION OF DIALYSIS CATHETER: SHX1324

## 2023-06-21 HISTORY — PX: INSERTION OF ARTERIOVENOUS (AV) ARTEGRAFT ARM: SHX6779

## 2023-06-21 LAB — BASIC METABOLIC PANEL WITH GFR
Anion gap: 16 — ABNORMAL HIGH (ref 5–15)
BUN: 112 mg/dL — ABNORMAL HIGH (ref 8–23)
CO2: 23 mmol/L (ref 22–32)
Calcium: 8.3 mg/dL — ABNORMAL LOW (ref 8.9–10.3)
Chloride: 98 mmol/L (ref 98–111)
Creatinine, Ser: 4.85 mg/dL — ABNORMAL HIGH (ref 0.44–1.00)
GFR, Estimated: 9 mL/min — ABNORMAL LOW (ref >60)
Glucose, Bld: 154 mg/dL — ABNORMAL HIGH (ref 70–99)
Potassium: 3.9 mmol/L (ref 3.5–5.1)
Sodium: 137 mmol/L (ref 135–145)

## 2023-06-21 LAB — GLUCOSE, CAPILLARY
Glucose-Capillary: 109 mg/dL — ABNORMAL HIGH (ref 70–99)
Glucose-Capillary: 110 mg/dL — ABNORMAL HIGH (ref 70–99)
Glucose-Capillary: 194 mg/dL — ABNORMAL HIGH (ref 70–99)
Glucose-Capillary: 244 mg/dL — ABNORMAL HIGH (ref 70–99)

## 2023-06-21 LAB — CBC
HCT: 25.7 % — ABNORMAL LOW (ref 36.0–46.0)
Hemoglobin: 8.3 g/dL — ABNORMAL LOW (ref 12.0–15.0)
MCH: 28.7 pg (ref 26.0–34.0)
MCHC: 32.3 g/dL (ref 30.0–36.0)
MCV: 88.9 fL (ref 80.0–100.0)
Platelets: 221 10*3/uL (ref 150–400)
RBC: 2.89 MIL/uL — ABNORMAL LOW (ref 3.87–5.11)
RDW: 17.7 % — ABNORMAL HIGH (ref 11.5–15.5)
WBC: 14.4 10*3/uL — ABNORMAL HIGH (ref 4.0–10.5)
nRBC: 0.3 % — ABNORMAL HIGH (ref 0.0–0.2)

## 2023-06-21 LAB — HEPATITIS B SURFACE ANTIBODY, QUANTITATIVE: Hep B S AB Quant (Post): <3.5 m[IU]/mL — ABNORMAL LOW

## 2023-06-21 SURGERY — INSERTION, GRAFT, ARTERIOVENOUS, UPPER EXTREMITY
Anesthesia: General | Site: Neck | Laterality: Right

## 2023-06-21 MED ORDER — ORAL CARE MOUTH RINSE: 15.0000 mL | Freq: Once | OROMUCOSAL | Status: AC

## 2023-06-21 MED ORDER — 0.9 % SODIUM CHLORIDE (POUR BTL) OPTIME
TOPICAL | Status: DC | PRN
  Administered 2023-06-21: 1000 mL

## 2023-06-21 MED ORDER — HEPARIN 6000 UNIT IRRIGATION SOLUTION
Status: DC | PRN
  Administered 2023-06-21: 1

## 2023-06-21 MED ORDER — SODIUM CHLORIDE 0.9 % IV SOLN: INTRAVENOUS | Status: DC

## 2023-06-21 MED ORDER — HYDROMORPHONE HCL 1 MG/ML IJ SOLN
INTRAMUSCULAR | Status: DC | PRN
  Administered 2023-06-21: .5 mg via INTRAVENOUS

## 2023-06-21 MED ORDER — SUGAMMADEX SODIUM 200 MG/2ML IV SOLN
INTRAVENOUS | Status: DC | PRN
  Administered 2023-06-21: 200 mg via INTRAVENOUS

## 2023-06-21 MED ORDER — ONDANSETRON HCL 4 MG/2ML IJ SOLN
INTRAMUSCULAR | Status: DC | PRN
  Administered 2023-06-21: 4 mg via INTRAVENOUS

## 2023-06-21 MED ORDER — LIDOCAINE 2% (20 MG/ML) 5 ML SYRINGE
INTRAMUSCULAR | Status: DC | PRN
  Administered 2023-06-21: 40 mg via INTRAVENOUS

## 2023-06-21 MED ORDER — HYDROMORPHONE HCL 1 MG/ML IJ SOLN
INTRAMUSCULAR | Status: AC
  Filled 2023-06-21: qty 0.5

## 2023-06-21 MED ORDER — PROPOFOL 10 MG/ML IV BOLUS
INTRAVENOUS | Status: AC
  Filled 2023-06-21: qty 20

## 2023-06-21 MED ORDER — CHLORHEXIDINE GLUCONATE 0.12 % MT SOLN: 15.0000 mL | Freq: Once | OROMUCOSAL | Status: AC

## 2023-06-21 MED ORDER — HEPARIN SODIUM (PORCINE) 1000 UNIT/ML IJ SOLN
INTRAMUSCULAR | Status: AC
  Filled 2023-06-21: qty 10

## 2023-06-21 MED ORDER — DEXAMETHASONE SODIUM PHOSPHATE 10 MG/ML IJ SOLN
INTRAMUSCULAR | Status: DC | PRN
  Administered 2023-06-21: 5 mg via INTRAVENOUS

## 2023-06-21 MED ORDER — HEPARIN SODIUM (PORCINE) 1000 UNIT/ML IJ SOLN
INTRAMUSCULAR | Status: DC | PRN
  Administered 2023-06-21: 3200 [IU]

## 2023-06-21 MED ORDER — FENTANYL CITRATE (PF) 250 MCG/5ML IJ SOLN
INTRAMUSCULAR | Status: DC | PRN
Start: 2023-06-21 — End: 2023-06-21
  Administered 2023-06-21: 50 ug via INTRAVENOUS

## 2023-06-21 MED ORDER — PROPOFOL 500 MG/50ML IV EMUL
INTRAVENOUS | Status: DC | PRN
  Administered 2023-06-21: 150 ug/kg/min via INTRAVENOUS

## 2023-06-21 MED ORDER — FENTANYL CITRATE (PF) 250 MCG/5ML IJ SOLN
INTRAMUSCULAR | Status: AC
  Filled 2023-06-21: qty 5

## 2023-06-21 MED ORDER — PROPOFOL 10 MG/ML IV BOLUS
INTRAVENOUS | Status: DC | PRN
  Administered 2023-06-21: 100 mg via INTRAVENOUS

## 2023-06-21 MED ORDER — CEFAZOLIN SODIUM-DEXTROSE 2-3 GM-%(50ML) IV SOLR
INTRAVENOUS | Status: DC | PRN
  Administered 2023-06-21: 2 g via INTRAVENOUS

## 2023-06-21 MED ORDER — PHENYLEPHRINE HCL-NACL 20-0.9 MG/250ML-% IV SOLN
INTRAVENOUS | Status: DC | PRN
  Administered 2023-06-21: 15 ug/min via INTRAVENOUS

## 2023-06-21 MED ORDER — ROCURONIUM BROMIDE 10 MG/ML (PF) SYRINGE
PREFILLED_SYRINGE | INTRAVENOUS | Status: DC | PRN
  Administered 2023-06-21: 50 mg via INTRAVENOUS

## 2023-06-21 MED ORDER — CHLORHEXIDINE GLUCONATE 0.12 % MT SOLN
OROMUCOSAL | Status: AC
  Administered 2023-06-21: 15 mL via OROMUCOSAL
  Filled 2023-06-21: qty 15

## 2023-06-21 MED ORDER — LIDOCAINE HCL (PF) 1 % IJ SOLN
INTRAMUSCULAR | Status: AC
Start: 2023-06-21 — End: ?
  Filled 2023-06-21: qty 30

## 2023-06-21 SURGICAL SUPPLY — 50 items
ARMBAND PINK RESTRICT EXTREMIT (MISCELLANEOUS) ×4 IMPLANT
BAG COUNTER SPONGE SURGICOUNT (BAG) ×4 IMPLANT
BAG DECANTER FOR FLEXI CONT (MISCELLANEOUS) ×4 IMPLANT
BIOPATCH RED 1 DISK 7.0 (GAUZE/BANDAGES/DRESSINGS) ×4 IMPLANT
CANISTER SUCT 3000ML PPV (MISCELLANEOUS) ×4 IMPLANT
CATH PALINDROME-P 19CM W/VT (CATHETERS) ×1 IMPLANT
CATH PALINDROME-P 23CM W/VT (CATHETERS) IMPLANT
CATH PALINDROME-P 28CM W/VT (CATHETERS) IMPLANT
CLIP LIGATING EXTRA MED SLVR (CLIP) ×4 IMPLANT
CLIP LIGATING EXTRA SM BLUE (MISCELLANEOUS) ×4 IMPLANT
COVER PROBE W GEL 5X96 (DRAPES) ×4 IMPLANT
COVER SURGICAL LIGHT HANDLE (MISCELLANEOUS) ×4 IMPLANT
DERMABOND ADVANCED .7 DNX12 (GAUZE/BANDAGES/DRESSINGS) ×4 IMPLANT
DRAPE C-ARM 42X72 X-RAY (DRAPES) ×4 IMPLANT
DRAPE CHEST BREAST 15X10 FENES (DRAPES) ×3 IMPLANT
ELECT REM PT RETURN 9FT ADLT (ELECTROSURGICAL) ×3 IMPLANT
ELECTRODE REM PT RTRN 9FT ADLT (ELECTROSURGICAL) ×4 IMPLANT
GAUZE 4X4 16PLY ~~LOC~~+RFID DBL (SPONGE) ×4 IMPLANT
GLOVE BIO SURGEON STRL SZ7.5 (GLOVE) ×4 IMPLANT
GOWN STRL REUS W/ TWL LRG LVL3 (GOWN DISPOSABLE) ×8 IMPLANT
GOWN STRL REUS W/ TWL XL LVL3 (GOWN DISPOSABLE) ×4 IMPLANT
GRAFT GORETEX STRT 4-7X45 (Vascular Products) ×1 IMPLANT
INSERT FOGARTY SM (MISCELLANEOUS) IMPLANT
KIT BASIN OR (CUSTOM PROCEDURE TRAY) ×4 IMPLANT
KIT PALINDROME-P 55CM (CATHETERS) IMPLANT
KIT TURNOVER KIT B (KITS) ×4 IMPLANT
NDL 18GX1X1/2 (RX/OR ONLY) (NEEDLE) ×3 IMPLANT
NDL HYPO 25GX1X1/2 BEV (NEEDLE) ×3 IMPLANT
NEEDLE 18GX1X1/2 (RX/OR ONLY) (NEEDLE) ×3 IMPLANT
NEEDLE HYPO 25GX1X1/2 BEV (NEEDLE) ×3 IMPLANT
NS IRRIG 1000ML POUR BTL (IV SOLUTION) ×4 IMPLANT
PACK CV ACCESS (CUSTOM PROCEDURE TRAY) ×4 IMPLANT
PACK SRG BSC III STRL LF ECLPS (CUSTOM PROCEDURE TRAY) ×3 IMPLANT
PAD ARMBOARD POSITIONER FOAM (MISCELLANEOUS) ×8 IMPLANT
POWDER SURGICEL 3.0 GRAM (HEMOSTASIS) IMPLANT
SLING ARM FOAM STRAP LRG (SOFTGOODS) IMPLANT
SLING ARM FOAM STRAP MED (SOFTGOODS) IMPLANT
SOAP 2 % CHG 4 OZ (WOUND CARE) ×3 IMPLANT
SUT ETHILON 3 0 PS 1 (SUTURE) ×4 IMPLANT
SUT MNCRL AB 4-0 PS2 18 (SUTURE) ×6 IMPLANT
SUT PROLENE 6 0 BV (SUTURE) ×5 IMPLANT
SUT VIC AB 3-0 SH 27X BRD (SUTURE) ×5 IMPLANT
SYR 10ML LL (SYRINGE) ×4 IMPLANT
SYR 20ML LL LF (SYRINGE) ×8 IMPLANT
SYR 5ML LL (SYRINGE) ×4 IMPLANT
SYR CONTROL 10ML LL (SYRINGE) ×3 IMPLANT
TOWEL GREEN STERILE (TOWEL DISPOSABLE) ×4 IMPLANT
TOWEL GREEN STERILE FF (TOWEL DISPOSABLE) ×8 IMPLANT
UNDERPAD 30X36 HEAVY ABSORB (UNDERPADS AND DIAPERS) ×4 IMPLANT
WATER STERILE IRR 1000ML POUR (IV SOLUTION) ×4 IMPLANT

## 2023-06-21 NOTE — Transfer of Care (Signed)
 Immediate Anesthesia Transfer of Care Note  Patient: [name]  Procedure(s) Performed: INSERTION, GRAFT, ARTERIOVENOUS, UPPER EXTREMITY (Left: Arm Upper) INSERTION OF DIALYSIS CATHETER (Right: Neck)  Patient Location: PACU  Anesthesia Type:General  Level of Consciousness: awake and alert   Airway & Oxygen Therapy: Patient Spontanous Breathing  Post-op Assessment: Report given to RN  Post vital signs: Reviewed and stable  Last Vitals:  Vitals Value Taken Time  BP 172/71 06/21/23 1448  Temp    Pulse 91 06/21/23 1452  Resp 14 06/21/23 1452  SpO2 100 % 06/21/23 1452  Vitals shown include unfiled device data.  Last Pain:  Vitals:   06/21/23 1109  TempSrc:   PainSc: 0-No pain      Patients Stated Pain Goal: 0 (06/21/23 1109)  Complications: No notable events documented.

## 2023-06-21 NOTE — Plan of Care (Signed)
   Problem: Metabolic: Goal: Ability to maintain appropriate glucose levels will improve Outcome: Progressing   Problem: Skin Integrity: Goal: Risk for impaired skin integrity will decrease Outcome: Progressing   Problem: Education: Goal: Knowledge of General Education information will improve Description: Including pain rating scale, medication(s)/side effects and non-pharmacologic comfort measures Outcome: Progressing

## 2023-06-21 NOTE — Op Note (Signed)
 Patient name: [name] MRN: [id number] DOB: 11-29-1945 Sex: female  06/21/2023 Pre-operative Diagnosis: End-stage renal disease Post-operative diagnosis:  Same Surgeon:  Apolinar Junes C. Randie Heinz, MD Assistant: Doreatha Massed, PA Procedure Performed: 1.  Placement of right IJ 19 cm tunneled dialysis catheter with ultrasound fluoroscopic guidance 2.  Left brachial artery to axillary vein 4-7 mm stretch PTFE AV graft placement  Indications: 78 year old female now with end-stage renal disease in need of urgent hemodialysis and will also need permanent dialysis access.  She has marginal vein for AV fistula creation in her nondominant left upper extremity and is now indicated for left upper extremity fistula versus more likely graft.  We have discussed the risk benefits and alternatives with the patient and her family and they demonstrate good understanding and have consented to proceed.  An experienced assistant was necessary to facilitate exposure of both the axillary vein and brachial artery and perform graft between the 2 including sewing the anastomosis and closure of both incisions.  Findings:  8 mm axillary vein 4 mm brachial artery with mild disease Strong thrill at completion in the left upper arm AV graft with weakly palpable left radial pulse confirmed with Doppler   Procedure:  The patient was identified in the holding area and taken to the operating room where she was placed upon operative table and general anesthesia was induced.  She was sterilely prepped and draped in the bilateral neck and chest as well as left upper extremity in usual fashion, antibiotics were administered a timeout was called.  We began using ultrasound to identify the right internal jugular vein which was patent and compressible and this was cannulated with a micropuncture needle followed by wire and sheath.  We then created a counterincision and tunneled a 19 cm tunneled dialysis catheter.  The wire tract was  serially dilated and we placed the introducer sheath under fluoroscopic guidance.  Catheter was then placed to the SVC atrial junction under fluoroscopic guidance.  It was then affixed to the skin with 3-0 nylon suture and flushed with heparinized saline in both ports and then locked with 1.6 cc of concentrated heparin and both ports.  The neck incision was closed with 4 Monocryl and Dermabond was placed to both sides and a sterile dressing was applied.  Attention was then turned to the right upper extremity.  There is no suitable cephalic or basilic vein for fistula creation in the upper arm.  A transverse incision was created over the brachial artery we dissected down to the brachial artery and encircled this with vessel loop.  In the axilla ultrasound was used and we identified a suitable axillary vein which was patent and compressible and we created an incision and dissected down to this divided a few branches between clips and ties and then isolated this with a vessel loop as well.  A 4-7 mm PTFE graft was then tunneled between the into the incisions.  In the axilla we trimmed this to approximate size with spatulating and then clamped the axillary vein proximally and distally and opened longitudinally.  The graft was then sewn into side with 6-0 Prolene suture.  Upon completion we then flushed through the graft and reclamped the graft.  The brachial artery was clamped distally and proximally opened longitudinally and flushed with heparinized saline in both directions.  The graft was then trimmed to size on the 4 mm and then sewn into side with 6-0 Prolene suture.  Again prior to completion without flushing in  all directions upon completion there was a very strong thrill in the graft and there was a weakly palpable radial artery pulse at the wrist which improved with compression of the graft and there was a much stronger signal with compression of the graft as well.  With this we irrigated the wounds and obtain  hemostasis and closed in layers of Vicryl and Monocryl.  Patient was then awakened from anesthesia having tolerated the procedure without any complication.  All counts were correct at completion.  EBL: 50 cc    [name] C. Randie Heinz, MD [facility] [place] Office: [phone] Pager: [phone]

## 2023-06-21 NOTE — Progress Notes (Signed)
 PROGRESS NOTE    [name]  UEA:[id number] DOB: 27-Jul-1945 DOA: 06/15/2023 PCP: Renford Dills, MD  77/F with CAD, chronic diastolic CHF, CKD 4, history of DVT, renal vein thrombosis on Eliquis, CVA, depression, memory loss, type 2 diabetes mellitus, history of breast cancer, lupus was admitted 3/8-15 with volume overload, progressive CKD, followed by nephrology, diuresed and discharged home on oral diuretics, also seen by gastroenterology for worsening chronic anemia, EGD and colonoscopy were unremarkable, Eliquis resumed at discharge. -Back in the ED 3/29 with progressive dyspnea on exertion orthopnea lower extremity edema and weight gain. -In the ED hypertensive, otherwise vital signs stable, hemoglobin 7.0, creatinine 4.0, BUN 82, BNP 560, troponin 125, chest x-ray unremarkable -Admitted, started on IV diuretics, nephrology following -4/2, vascular surgery consult, plan to start HD  Subjective: -Feels fair, breathing is improving  Assessment and Plan:  Acute on chronic HFpEF Progressive CKD stage 4/5 -Recent hospitalization for same  -Echo 3/25 with EF 60-65, grade 1 DD, normal RV  -Diuresing on IV Lasix, 8.3L negative, limited response to oral diuretics, and frequent readmissions with advanced CKD, plan to start HD today -Nephrology following -Hold Eliquis, resume tonight after procedures   Anemia of chronic disease, iron deficiency -Underwent GI workup few weeks ago which was unremarkable -Baseline hemoglobin 8-10, lower this admission, transfused 1 unit PRBC 3/30 -Needs Aranesp, given IV iron, improving  Left foot, gout flare -Uric acid is 12.3, improving, completes 3 days of prednisone today  Leukocytosis, likely secondary to steroids   CAD status post PCI in 2000 EKG without acute ischemic changes.  Stable   Hypertension -Continue amlodipine.   History of renal vein thrombosis 09/2017 and DVT 06/2020 -Hold Eliquis, will resume after vascular surgeries    Depression Mild Dementia -Continue fluoxetine, rivastigmine   Type 2 diabetes A1c 6.2 on 05/08/2023.  Sensitive sliding scale -CBGs slightly higher on prednisone, now discontinued   Chronic pain/neuropathy Continue gabapentin.   GERD/gastritis Continue Protonix.   DVT prophylaxis: Eliquis> now held Code Status: Full code Family Communication: Discussed with daughter Disposition Plan: Home likely early next week  Consultants:    Procedures:   Antimicrobials:    Objective: Vitals:   06/21/23 0415 06/21/23 0416 06/21/23 0417 06/21/23 0710  BP: (!) 173/73   (!) 167/68  Pulse: 88 87 86 81  Resp: 15 18 15 19   Temp: 98.3 F (36.8 C)   98.6 F (37 C)  TempSrc: Oral   Oral  SpO2: 99% 100% 99% 96%  Weight:      Height:        Intake/Output Summary (Last 24 hours) at 06/21/2023 1038 Last data filed at 06/21/2023 0924 Gross per 24 hour  Intake 660 ml  Output 1901 ml  Net -1241 ml   Filed Weights   06/19/23 0531 06/20/23 0529 06/21/23 0409  Weight: 75.9 kg 78 kg 75.1 kg    Examination:  Gen: Awake, Alert, Oriented X2, mild cognitive deficits HEENT: + JVD Lungs: Clear anteriorly CVS: S1S2/RRR Abd: soft, Non tender, non distended, BS present Extremities: 1-2+ edema edema Skin: no new rashes on exposed skin     Data Reviewed:   CBC: Recent Labs  Lab 06/17/23 0330 06/18/23 0334 06/19/23 0237 06/20/23 0254 06/21/23 0304  WBC 10.7* 10.5 10.9* 13.5* 14.4*  HGB 8.7* 8.1* 7.8* 8.7* 8.3*  HCT 27.8* 26.1* 25.4* 27.7* 25.7*  MCV 89.4 89.4 90.4 88.8 88.9  PLT 174 177 169 196 221   Basic Metabolic Panel: Recent Labs  Lab 06/17/23  0330 06/18/23 0334 06/19/23 0237 06/20/23 0254 06/21/23 0304  NA 140 140 135 136 137  K 4.7 4.3 4.1 4.1 3.9  CL 105 104 100 98 98  CO2 25 25 24 22 23   GLUCOSE 95 103* 104* 147* 154*  BUN 81* 84* 92* 103* 112*  CREATININE 3.85* 4.30* 4.83* 4.85* 4.85*  CALCIUM 8.8* 8.4*  8.3* 8.0* 8.5* 8.3*  PHOS 4.1  --   --   --   --     GFR: Estimated Creatinine Clearance: 10 mL/min (A) (by C-G formula based on SCr of 4.85 mg/dL (H)). Liver Function Tests: No results for input(s): "AST", "ALT", "ALKPHOS", "BILITOT", "PROT", "ALBUMIN" in the last 168 hours. No results for input(s): "LIPASE", "AMYLASE" in the last 168 hours. No results for input(s): "AMMONIA" in the last 168 hours. Coagulation Profile: No results for input(s): "INR", "PROTIME" in the last 168 hours. Cardiac Enzymes: No results for input(s): "CKTOTAL", "CKMB", "CKMBINDEX", "TROPONINI" in the last 168 hours. BNP (last 3 results) No results for input(s): "PROBNP" in the last 8760 hours. HbA1C: No results for input(s): "HGBA1C" in the last 72 hours. CBG: Recent Labs  Lab 06/20/23 0632 06/20/23 1110 06/20/23 1520 06/20/23 2103 06/21/23 0547  GLUCAP 110* 166* 245* 248* 109*   Lipid Profile: No results for input(s): "CHOL", "HDL", "LDLCALC", "TRIG", "CHOLHDL", "LDLDIRECT" in the last 72 hours. Thyroid Function Tests: No results for input(s): "TSH", "T4TOTAL", "FREET4", "T3FREE", "THYROIDAB" in the last 72 hours. Anemia Panel: No results for input(s): "VITAMINB12", "FOLATE", "FERRITIN", "TIBC", "IRON", "RETICCTPCT" in the last 72 hours.  Urine analysis:    Component Value Date/Time   COLORURINE STRAW (A) 05/26/2023 1610   APPEARANCEUR CLEAR 05/26/2023 1610   APPEARANCEUR Clear 08/22/2018 1620   LABSPEC 1.008 05/26/2023 1610   LABSPEC 1.020 03/03/2014 1341   PHURINE 5.0 05/26/2023 1610   GLUCOSEU NEGATIVE 05/26/2023 1610   GLUCOSEU Negative 03/03/2014 1341   HGBUR SMALL (A) 05/26/2023 1610   BILIRUBINUR NEGATIVE 05/26/2023 1610   BILIRUBINUR Negative 08/22/2018 1620   BILIRUBINUR Negative 03/03/2014 1341   KETONESUR NEGATIVE 05/26/2023 1610   PROTEINUR 100 (A) 05/26/2023 1610   UROBILINOGEN 0.2 03/03/2014 1341   NITRITE NEGATIVE 05/26/2023 1610   LEUKOCYTESUR NEGATIVE 05/26/2023 1610   LEUKOCYTESUR Negative 03/03/2014 1341   Sepsis  Labs: @LABRCNTIP (procalcitonin:4,lacticidven:4)  )No results found for this or any previous visit (from the past 240 hours).   Radiology Studies: VAS [place] UPPER EXT VEIN MAPPING (PRE-OP AVF) Result Date: 06/19/2023 UPPER EXTREMITY VEIN MAPPING Patient Name:  [name]  Date of Exam:   06/19/2023 Medical Rec #: [id number]           Accession #:    [id number] Date of Birth: 04/18/1945           Patient Gender: F Patient Age:   29 years Exam Location:  [facility] Procedure:      VAS [place] UPPER EXT VEIN MAPPING (PRE-OP AVF) Referring Phys: Estill Bakes --------------------------------------------------------------------------------  Indications: Pre-access. Comparison Study: None. Performing Technologist: Shona Simpson  Examination Guidelines: A complete evaluation includes B-mode imaging, spectral Doppler, color Doppler, and power Doppler as needed of all accessible portions of each vessel. Bilateral testing is considered an integral part of a complete examination. Limited examinations for reoccurring indications may be performed as noted. +-----------------+-------------+----------+--------------+ Right Cephalic   Diameter (cm)Depth (cm)   Findings    +-----------------+-------------+----------+--------------+ Shoulder             0.25  1.22                  +-----------------+-------------+----------+--------------+ Prox upper arm       0.22        0.87     branching    +-----------------+-------------+----------+--------------+ Mid upper arm        0.22        0.45                  +-----------------+-------------+----------+--------------+ Dist upper arm       0.15        0.34                  +-----------------+-------------+----------+--------------+ Antecubital fossa    0.26        0.27                  +-----------------+-------------+----------+--------------+ Prox forearm         0.18        0.25                   +-----------------+-------------+----------+--------------+ Mid forearm          0.07        0.24                  +-----------------+-------------+----------+--------------+ Dist forearm                            not visualized +-----------------+-------------+----------+--------------+ Wrist                                   not visualized +-----------------+-------------+----------+--------------+ +-----------------+-------------+----------+--------------+ Right Basilic    Diameter (cm)Depth (cm)   Findings    +-----------------+-------------+----------+--------------+ Shoulder                                not visualized +-----------------+-------------+----------+--------------+ Prox upper arm                          not visualized +-----------------+-------------+----------+--------------+ Mid upper arm                           not visualized +-----------------+-------------+----------+--------------+ Dist upper arm       0.31        1.53                  +-----------------+-------------+----------+--------------+ Antecubital fossa    0.32        0.69     branching    +-----------------+-------------+----------+--------------+ Prox forearm         0.25        0.17                  +-----------------+-------------+----------+--------------+ Mid forearm          0.25        0.26                  +-----------------+-------------+----------+--------------+ Distal forearm       0.25        0.22                  +-----------------+-------------+----------+--------------+ Wrist                0.18  0.18                  +-----------------+-------------+----------+--------------+ +-----------------+-------------+----------+-----------------------------+ Left Cephalic    Diameter (cm)Depth (cm)          Findings            +-----------------+-------------+----------+-----------------------------+ Shoulder             0.32        1.44                                  +-----------------+-------------+----------+-----------------------------+ Prox upper arm       0.29        0.84                                 +-----------------+-------------+----------+-----------------------------+ Mid upper arm        0.25        0.36             branching           +-----------------+-------------+----------+-----------------------------+ Dist upper arm       0.25        0.39             branching           +-----------------+-------------+----------+-----------------------------+ Antecubital fossa    0.25        0.37                                 +-----------------+-------------+----------+-----------------------------+ Prox forearm         0.21        0.46             branching           +-----------------+-------------+----------+-----------------------------+ Mid forearm          0.12        0.57             branching           +-----------------+-------------+----------+-----------------------------+ Dist forearm                            not visualized and obstructed +-----------------+-------------+----------+-----------------------------+ Wrist                                   not visualized and obstructed +-----------------+-------------+----------+-----------------------------+ +-----------------+-------------+----------+--------------+ Left Basilic     Diameter (cm)Depth (cm)   Findings    +-----------------+-------------+----------+--------------+ Shoulder                                not visualized +-----------------+-------------+----------+--------------+ Prox upper arm                          not visualized +-----------------+-------------+----------+--------------+ Mid upper arm                           not visualized +-----------------+-------------+----------+--------------+ Dist upper arm       0.32        1.20                   +-----------------+-------------+----------+--------------+  Antecubital fossa    1.70        0.50     branching    +-----------------+-------------+----------+--------------+ Prox forearm         0.15        0.44     branching    +-----------------+-------------+----------+--------------+ Mid forearm          0.15        0.30                  +-----------------+-------------+----------+--------------+ Distal forearm       0.09        1.74                  +-----------------+-------------+----------+--------------+ Wrist                0.13        0.26                  +-----------------+-------------+----------+--------------+ *See table(s) above for measurements and observations.  Diagnosing physician: Coral Else MD Electronically signed by Coral Else MD on 06/19/2023 at 7:00:51 PM.    Final       Scheduled Meds:  amLODipine  10 mg Oral Daily   Chlorhexidine Gluconate Cloth  6 each Topical Q0600   colestipol  1 g Oral TID   FLUoxetine  20 mg Oral Daily   folic acid  1 mg Oral Daily   furosemide  80 mg Intravenous TID   gabapentin  300 mg Oral QHS   insulin aspart  0-5 Units Subcutaneous QHS   insulin aspart  0-6 Units Subcutaneous TID WC   lipase/protease/amylase  36,000 Units Oral TID AC   mirtazapine  7.5 mg Oral QHS   multivitamin  1 tablet Oral QHS   pantoprazole  40 mg Oral BID   polyethylene glycol  17 g Oral Daily   rivastigmine  1.5 mg Oral BID   sevelamer carbonate  800 mg Oral TID WC   Continuous Infusions:     LOS: 5 days    Time spent:    Zannie Cove, MD [facility]   06/21/2023, 10:38 AM

## 2023-06-21 NOTE — Progress Notes (Signed)
  Progress Note    06/21/2023 12:04 PM * Day of Surgery *  Subjective:  no new issues  Vitals:   06/21/23 0710 06/21/23 1052  BP: (!) 167/68 (!) (P) 178/80  Pulse: 81 (P) 88  Resp: 19 (P) 18  Temp: 98.6 F (37 C) (P) 98.1 F (36.7 C)  SpO2: 96% (P) 95%    Physical Exam: Awake and alert Non labored respirations 2+ left radial pulse  CBC    Component Value Date/Time   WBC 14.4 (H) 06/21/2023 0304   RBC 2.89 (L) 06/21/2023 0304   HGB 8.3 (L) 06/21/2023 0304   HGB 9.4 (L) 04/18/2023 1434   HGB 9.3 (L) 03/14/2017 0810   HCT 25.7 (L) 06/21/2023 0304   HCT 26.4 (L) 07/21/2019 0809   HCT 29.2 (L) 03/14/2017 0810   PLT 221 06/21/2023 0304   PLT 235 04/18/2023 1434   PLT 292 03/14/2017 0810   PLT 351 10/11/2016 1139   MCV 88.9 06/21/2023 0304   MCV 81.3 03/14/2017 0810   MCH 28.7 06/21/2023 0304   MCHC 32.3 06/21/2023 0304   RDW 17.7 (H) 06/21/2023 0304   RDW 14.0 03/14/2017 0810   LYMPHSABS 1.0 05/17/2023 1149   LYMPHSABS 1.4 03/14/2017 0810   MONOABS 0.8 05/17/2023 1149   MONOABS 0.4 03/14/2017 0810   EOSABS 0.0 05/17/2023 1149   EOSABS 0.1 03/14/2017 0810   BASOSABS 0.0 05/17/2023 1149   BASOSABS 0.0 03/14/2017 0810    BMET    Component Value Date/Time   NA 137 06/21/2023 0304   NA 144 07/19/2020 1458   NA 140 03/14/2017 0810   K 3.9 06/21/2023 0304   K 3.8 03/14/2017 0810   CL 98 06/21/2023 0304   CO2 23 06/21/2023 0304   CO2 27 03/14/2017 0810   GLUCOSE 154 (H) 06/21/2023 0304   GLUCOSE 177 (H) 03/14/2017 0810   BUN 112 (H) 06/21/2023 0304   BUN 85 (HH) 07/19/2020 1458   BUN 35.6 (H) 03/14/2017 0810   CREATININE 4.85 (H) 06/21/2023 0304   CREATININE 2.31 (H) 10/25/2022 1355   CREATININE 1.8 (H) 03/14/2017 0810   CALCIUM 8.3 (L) 06/21/2023 0304   CALCIUM 8.3 (L) 06/18/2023 0334   CALCIUM 9.6 03/14/2017 0810   GFRNONAA 9 (L) 06/21/2023 0304   GFRNONAA 22 (L) 10/25/2022 1355   GFRNONAA 45 (L) 12/17/2013 1158   GFRAA 21 (L) 12/21/2019 0845    GFRAA 52 (L) 12/17/2013 1158    INR    Component Value Date/Time   INR 1.5 (H) 07/28/2022 1615     Intake/Output Summary (Last 24 hours) at 06/21/2023 1204 Last data filed at 06/21/2023 0924 Gross per 24 hour  Intake 660 ml  Output 1601 ml  Net -941 ml     Assessment:  78 y.o. female is now esrd  Plan: OR today for tdc and left arm avf vs avg.    Brance Dartt C. Randie Heinz, MD [facility] Office: [phone] Pager: [phone]  06/21/2023 12:04 PM

## 2023-06-21 NOTE — Progress Notes (Signed)
 [name] NEPHROLOGY PROGRESS NOTE  Assessment/ Plan: Pt is a 78 y.o. yo female  with history of hypertension, renal vein thrombosis on Eliquis, stroke, type II DM, lupus, CKD 4-5 who was recently admitted for CHF exacerbation and progressive CKD presented back with worsening dyspnea on exertion and fluid overload.  I had discussed with the patient and her daughter about dialysis during recent hospitalization and made plan to manage medically with diuretics and outpatient follow-up.  Her daughter works at [employer].  # Progressive CKD 5 versus AKI on CKD,: With frequent hospitalization for fluid overload.  Responding to IV diuretics but not po and with worsening renal function d/w patient and family (Daughter on phone 4/2) at length recommend starting dialysis.  She is agreeable.  Vein map,  VVS for TDC + perm access planned today - appreciated followed by HD 1 today and 2 tomorrow.  In the meantime cont IV diuretics.  CLIP to outpt HD - SW aware.     # CKD-MBD/hyperphosphatemia: Started sevelamer in the recent visit, monitor lab.  Checking PTH 220.   #Anemia of CKD: Concern about GI bleed, underwent endoscopy t recently with no sign of bleeding.  Back on Eliquis.  Hb was 6.4 and now 8s. after transfusion.  Recent iron saturation 35% but down to 15% so redosed.  S/p aranesp 100 3/30.  Was receiving ESA outpt through Dr Mosetta Putt but due to repeated admission it's been since 1/30.    # Hypertension/volume: diuresing.  Remains hypertensive.  Cont current meds while diuresing/ UF with HD.   #A fib: on eliquis - on hold in prep for AV access surgery.   We will continue to follow.  Subjective: Seen and examined in room.  Improved UOP with IV lasix  to 2.3L yesterday.  [name] daughter present today.    No frank uremic.    Objective Vital signs in last 24 hours: Vitals:   06/21/23 0415 06/21/23 0416 06/21/23 0417 06/21/23 0710  BP: (!) 173/73   (!) 167/68  Pulse: 88 87 86 81  Resp: 15 18 15  19   Temp: 98.3 F (36.8 C)   98.6 F (37 C)  TempSrc: Oral   Oral  SpO2: 99% 100% 99% 96%  Weight:      Height:       Weight change: -2.948 kg  Intake/Output Summary (Last 24 hours) at 06/21/2023 0913 Last data filed at 06/21/2023 0412 Gross per 24 hour  Intake 660 ml  Output 1901 ml  Net -1241 ml       Labs: RENAL PANEL Recent Labs  Lab 06/17/23 0330 06/18/23 0334 06/19/23 0237 06/20/23 0254 06/21/23 0304  NA 140 140 135 136 137  K 4.7 4.3 4.1 4.1 3.9  CL 105 104 100 98 98  CO2 25 25 24 22 23   GLUCOSE 95 103* 104* 147* 154*  BUN 81* 84* 92* 103* 112*  CREATININE 3.85* 4.30* 4.83* 4.85* 4.85*  CALCIUM 8.8* 8.4*  8.3* 8.0* 8.5* 8.3*  PHOS 4.1  --   --   --   --     Liver Function Tests: No results for input(s): "AST", "ALT", "ALKPHOS", "BILITOT", "PROT", "ALBUMIN" in the last 168 hours. No results for input(s): "LIPASE", "AMYLASE" in the last 168 hours. No results for input(s): "AMMONIA" in the last 168 hours. CBC: Recent Labs    01/31/23 1437 02/13/23 1332 02/28/23 1411 03/14/23 1450 04/04/23 1445 04/04/23 1446 04/18/23 1433 04/18/23 1434 05/08/23 1833 05/13/23 0358 05/13/23 0535 05/14/23 0802  05/28/23 0459 05/29/23 0735 06/17/23 0330 06/18/23 0334 06/19/23 0237 06/20/23 0254 06/21/23 0304  HGB 10.2*   < > 9.9*   < > 9.9*  --   --  9.4*   < >  --   --    < > 7.9*   < > 8.7* 8.1* 7.8* 8.7* 8.3*  MCV 87.7   < > 85.2   < > 85.9  --   --  86.4   < >  --   --    < > 87.1   < > 89.4 89.4 90.4 88.8 88.9  VITAMINB12 1,098*  --  833  --  700  --   --  835  --  1,040*  --   --   --   --   --   --   --   --   --   FOLATE  --   --   --   --   --   --   --   --   --   --  5.3*  --   --   --   --   --   --   --   --   FERRITIN  --   --   --   --   --  153 226  --   --  220  --   --  246  --  192  --   --   --   --   TIBC  --   --   --   --   --   --   --   --   --  206*  --   --  207*  --  267  --   --   --   --   IRON  --   --   --   --   --   --   --   --    --  46  --   --  73  --  40  --   --   --   --   RETICCTPCT  --   --   --   --   --   --   --   --   --  1.5  --   --   --   --   --   --   --   --   --    < > = values in this interval not displayed.    Cardiac Enzymes: No results for input(s): "CKTOTAL", "CKMB", "CKMBINDEX", "TROPONINI" in the last 168 hours. CBG: Recent Labs  Lab 06/20/23 0632 06/20/23 1110 06/20/23 1520 06/20/23 2103 06/21/23 0547  GLUCAP 110* 166* 245* 248* 109*    Iron Studies:  No results for input(s): "IRON", "TIBC", "TRANSFERRIN", "FERRITIN" in the last 72 hours.  Studies/Results: VAS [place] UPPER EXT VEIN MAPPING (PRE-OP AVF) Result Date: 06/19/2023 UPPER EXTREMITY VEIN MAPPING Patient Name:  [name]  Date of Exam:   06/19/2023 Medical Rec #: [id number]           Accession #:    [id number] Date of Birth: February 10, 1946           Patient Gender: F Patient Age:   47 years Exam Location:  [facility] Procedure:      VAS [place] UPPER EXT VEIN MAPPING (PRE-OP AVF) Referring Phys: Estill Bakes --------------------------------------------------------------------------------  Indications: Pre-access. Comparison Study: None. Performing Technologist: Shona Simpson  Examination Guidelines: A complete evaluation includes B-mode imaging, spectral Doppler, color Doppler, and power Doppler as needed of all accessible portions of each vessel. Bilateral testing is considered an integral part of a complete examination. Limited examinations for reoccurring indications may be performed as noted. +-----------------+-------------+----------+--------------+ Right Cephalic   Diameter (cm)Depth (cm)   Findings    +-----------------+-------------+----------+--------------+ Shoulder             0.25        1.22                  +-----------------+-------------+----------+--------------+ Prox upper arm       0.22        0.87     branching    +-----------------+-------------+----------+--------------+ Mid upper arm         0.22        0.45                  +-----------------+-------------+----------+--------------+ Dist upper arm       0.15        0.34                  +-----------------+-------------+----------+--------------+ Antecubital fossa    0.26        0.27                  +-----------------+-------------+----------+--------------+ Prox forearm         0.18        0.25                  +-----------------+-------------+----------+--------------+ Mid forearm          0.07        0.24                  +-----------------+-------------+----------+--------------+ Dist forearm                            not visualized +-----------------+-------------+----------+--------------+ Wrist                                   not visualized +-----------------+-------------+----------+--------------+ +-----------------+-------------+----------+--------------+ Right Basilic    Diameter (cm)Depth (cm)   Findings    +-----------------+-------------+----------+--------------+ Shoulder                                not visualized +-----------------+-------------+----------+--------------+ Prox upper arm                          not visualized +-----------------+-------------+----------+--------------+ Mid upper arm                           not visualized +-----------------+-------------+----------+--------------+ Dist upper arm       0.31        1.53                  +-----------------+-------------+----------+--------------+ Antecubital fossa    0.32        0.69     branching    +-----------------+-------------+----------+--------------+ Prox forearm         0.25        0.17                  +-----------------+-------------+----------+--------------+ Mid forearm          0.25  0.26                  +-----------------+-------------+----------+--------------+ Distal forearm       0.25        0.22                   +-----------------+-------------+----------+--------------+ Wrist                0.18        0.18                  +-----------------+-------------+----------+--------------+ +-----------------+-------------+----------+-----------------------------+ Left Cephalic    Diameter (cm)Depth (cm)          Findings            +-----------------+-------------+----------+-----------------------------+ Shoulder             0.32        1.44                                 +-----------------+-------------+----------+-----------------------------+ Prox upper arm       0.29        0.84                                 +-----------------+-------------+----------+-----------------------------+ Mid upper arm        0.25        0.36             branching           +-----------------+-------------+----------+-----------------------------+ Dist upper arm       0.25        0.39             branching           +-----------------+-------------+----------+-----------------------------+ Antecubital fossa    0.25        0.37                                 +-----------------+-------------+----------+-----------------------------+ Prox forearm         0.21        0.46             branching           +-----------------+-------------+----------+-----------------------------+ Mid forearm          0.12        0.57             branching           +-----------------+-------------+----------+-----------------------------+ Dist forearm                            not visualized and obstructed +-----------------+-------------+----------+-----------------------------+ Wrist                                   not visualized and obstructed +-----------------+-------------+----------+-----------------------------+ +-----------------+-------------+----------+--------------+ Left Basilic     Diameter (cm)Depth (cm)   Findings    +-----------------+-------------+----------+--------------+ Shoulder                                 not visualized +-----------------+-------------+----------+--------------+ Prox upper arm  not visualized +-----------------+-------------+----------+--------------+ Mid upper arm                           not visualized +-----------------+-------------+----------+--------------+ Dist upper arm       0.32        1.20                  +-----------------+-------------+----------+--------------+ Antecubital fossa    1.70        0.50     branching    +-----------------+-------------+----------+--------------+ Prox forearm         0.15        0.44     branching    +-----------------+-------------+----------+--------------+ Mid forearm          0.15        0.30                  +-----------------+-------------+----------+--------------+ Distal forearm       0.09        1.74                  +-----------------+-------------+----------+--------------+ Wrist                0.13        0.26                  +-----------------+-------------+----------+--------------+ *See table(s) above for measurements and observations.  Diagnosing physician: Coral Else MD Electronically signed by Coral Else MD on 06/19/2023 at 7:00:51 PM.    Final      Medications: Infusions:   Scheduled Medications:  amLODipine  10 mg Oral Daily   Chlorhexidine Gluconate Cloth  6 each Topical Q0600   colestipol  1 g Oral TID   FLUoxetine  20 mg Oral Daily   folic acid  1 mg Oral Daily   furosemide  80 mg Intravenous TID   gabapentin  300 mg Oral QHS   insulin aspart  0-5 Units Subcutaneous QHS   insulin aspart  0-6 Units Subcutaneous TID WC   lipase/protease/amylase  36,000 Units Oral TID AC   mirtazapine  7.5 mg Oral QHS   multivitamin  1 tablet Oral QHS   pantoprazole  40 mg Oral BID   polyethylene glycol  17 g Oral Daily   rivastigmine  1.5 mg Oral BID   sevelamer carbonate  800 mg Oral TID WC    have reviewed scheduled and prn  medications.  Physical Exam: General:NAD, comfortable Heart:RRR, s1s2 nl Lungs:clear b/l, no crackle Abdomen:soft, Non-tender, non-distended Extremities: BL trace pitting edema Neurology: Alert awake and following commands  Tyler Pita 06/21/2023,9:13 AM  LOS: 5 days

## 2023-06-21 NOTE — Progress Notes (Signed)
 S/p TDC and LUA AVG 06/21/2023.  Ok to restart anticoagulation in the am tomorrow 06/22/2023.    Doreatha Massed, [facility] 06/21/2023 2:33 PM

## 2023-06-21 NOTE — Anesthesia Preprocedure Evaluation (Signed)
 Anesthesia Evaluation  Patient identified by MRN, date of birth, ID band Patient awake    Reviewed: Allergy & Precautions, H&P , NPO status , Patient's Chart, lab work & pertinent test results  Airway Mallampati: II  TM Distance: >3 FB Neck ROM: Full    Dental no notable dental hx. (+) Edentulous Upper, Edentulous Lower, Dental Advisory Given   Pulmonary neg pulmonary ROS   Pulmonary exam normal breath sounds clear to auscultation       Cardiovascular hypertension, Pt. on medications + CAD, + Past MI, + Cardiac Stents and +CHF   Rhythm:Regular Rate:Normal     Neuro/Psych    Depression    CVA, No Residual Symptoms    GI/Hepatic Neg liver ROS,GERD  Medicated,,  Endo/Other  diabetes, Insulin Dependent    Renal/GU ESRF and DialysisRenal disease  negative genitourinary   Musculoskeletal   Abdominal   Peds  Hematology  (+) Blood dyscrasia, anemia   Anesthesia Other Findings   Reproductive/Obstetrics negative OB ROS                             Anesthesia Physical Anesthesia Plan  ASA: 3  Anesthesia Plan: General   Post-op Pain Management: Ofirmev IV (intra-op)*   Induction: Intravenous  PONV Risk Score and Plan: 4 or greater and Ondansetron, Dexamethasone, Propofol infusion and TIVA  Airway Management Planned: Oral ETT  Additional Equipment:   Intra-op Plan:   Post-operative Plan: Extubation in OR  Informed Consent: I have reviewed the patients History and Physical, chart, labs and discussed the procedure including the risks, benefits and alternatives for the proposed anesthesia with the patient or authorized representative who has indicated his/her understanding and acceptance.     Dental advisory given  Plan Discussed with: CRNA  Anesthesia Plan Comments:        Anesthesia Quick Evaluation

## 2023-06-21 NOTE — Anesthesia Postprocedure Evaluation (Signed)
 Anesthesia Post Note  Patient: Mehgan [place] Buzzelli  Procedure(s) Performed: INSERTION, GRAFT, ARTERIOVENOUS, UPPER EXTREMITY (Left: Arm Upper) INSERTION OF DIALYSIS CATHETER (Right: Neck)     Patient location during evaluation: PACU Anesthesia Type: General Level of consciousness: awake and alert Pain management: pain level controlled Vital Signs Assessment: post-procedure vital signs reviewed and stable Respiratory status: spontaneous breathing, nonlabored ventilation and respiratory function stable Cardiovascular status: blood pressure returned to baseline and stable Postop Assessment: no apparent nausea or vomiting Anesthetic complications: no  No notable events documented.  Last Vitals:  Vitals:   06/21/23 1448 06/21/23 1500  BP: (!) 172/71 (!) 161/69  Pulse: 91 91  Resp: 17 20  Temp: 36.4 C   SpO2: 100% 94%    Last Pain:  Vitals:   06/21/23 1448  TempSrc:   PainSc: Asleep                 Charleton Deyoung,W. EDMOND

## 2023-06-21 NOTE — Progress Notes (Signed)
 Met with pt's daughters in pt's room. Discussed out-pt HD options. Pt voiced interest in TCU yesterday and family agreeable to TCU at d/c as well. Pt has been accepted at [facility] at [facility] on Mon, Tues, Thurs, Fri with 11:15 am chair time. Pt can start on Monday if pt stable for d/c by then. Pt will need to arrive at 10:30 am on first day to complete paperwork prior to treatment. Daughters aware of this info and agreeable. Schedule letter provided to family as well. Daughter requesting assistance with transportation options/resources and assistance with checking on [employer] [place] app that family completed with [facility] social worker end of Feb. Contacted RN CM and CSW in regards to daughter's request for assistance. Nephrologist provided update on out-pt HD arrangements as well. HD arrangements placed on AVS. Will assist as needed.   Olivia Canter Renal Navigator [phone]

## 2023-06-21 NOTE — Progress Notes (Signed)
 Mobility Specialist Progress Note:    06/21/23 1222  Mobility  Activity Ambulated with assistance in hallway  Level of Assistance Standby assist, set-up cues, supervision of patient - no hands on  Assistive Device Front wheel walker  Distance Ambulated (ft) 200 ft  Activity Response Tolerated well  Mobility Referral Yes  Mobility visit 1 Mobility  Mobility Specialist Start Time (ACUTE ONLY) 0920  Mobility Specialist Stop Time (ACUTE ONLY) 0930  Mobility Specialist Time Calculation (min) (ACUTE ONLY) 10 min   Received pt in bed having no complaints and agreeable to mobility. Pt was asymptomatic throughout ambulation and returned to room w/o fault. Left seated EOB w/ call bell in reach and all needs met. Daughter in room.   Thompson Grayer Mobility Specialist  Please contact vis Secure Chat or  Rehab Office [phone]

## 2023-06-22 ENCOUNTER — Inpatient Hospital Stay (HOSPITAL_COMMUNITY)

## 2023-06-22 DIAGNOSIS — I10 Essential (primary) hypertension: Secondary | ICD-10-CM | POA: Diagnosis not present

## 2023-06-22 DIAGNOSIS — I5033 Acute on chronic diastolic (congestive) heart failure: Secondary | ICD-10-CM | POA: Diagnosis not present

## 2023-06-22 DIAGNOSIS — D649 Anemia, unspecified: Secondary | ICD-10-CM

## 2023-06-22 DIAGNOSIS — M109 Gout, unspecified: Secondary | ICD-10-CM | POA: Diagnosis present

## 2023-06-22 DIAGNOSIS — E119 Type 2 diabetes mellitus without complications: Secondary | ICD-10-CM

## 2023-06-22 DIAGNOSIS — N185 Chronic kidney disease, stage 5: Secondary | ICD-10-CM

## 2023-06-22 LAB — BASIC METABOLIC PANEL WITH GFR
Anion gap: 15 (ref 5–15)
BUN: 76 mg/dL — ABNORMAL HIGH (ref 8–23)
CO2: 24 mmol/L (ref 22–32)
Calcium: 8.4 mg/dL — ABNORMAL LOW (ref 8.9–10.3)
Chloride: 99 mmol/L (ref 98–111)
Creatinine, Ser: 3.69 mg/dL — ABNORMAL HIGH (ref 0.44–1.00)
GFR, Estimated: 12 mL/min — ABNORMAL LOW (ref >60)
Glucose, Bld: 202 mg/dL — ABNORMAL HIGH (ref 70–99)
Potassium: 3.6 mmol/L (ref 3.5–5.1)
Sodium: 138 mmol/L (ref 135–145)

## 2023-06-22 LAB — GLUCOSE, CAPILLARY
Glucose-Capillary: 182 mg/dL — ABNORMAL HIGH (ref 70–99)
Glucose-Capillary: 103 mg/dL — ABNORMAL HIGH (ref 70–99)
Glucose-Capillary: 184 mg/dL — ABNORMAL HIGH (ref 70–99)
Glucose-Capillary: 110 mg/dL — ABNORMAL HIGH (ref 70–99)

## 2023-06-22 MED ORDER — ALTEPLASE 2 MG IJ SOLR: 2.0000 mg | Freq: Once | INTRAMUSCULAR | Status: DC | PRN

## 2023-06-22 MED ORDER — LIDOCAINE HCL (PF) 1 % IJ SOLN: 5.0000 mL | INTRAMUSCULAR | Status: DC | PRN

## 2023-06-22 MED ORDER — HEPARIN SODIUM (PORCINE) 1000 UNIT/ML IJ SOLN
3800.0000 [IU] | Freq: Once | INTRAMUSCULAR | Status: AC
Start: 2023-06-22 — End: 2023-06-22
  Administered 2023-06-22: 3800 [IU] via INTRAVENOUS

## 2023-06-22 MED ORDER — APIXABAN 2.5 MG PO TABS
2.5000 mg | ORAL_TABLET | Freq: Two times a day (BID) | ORAL | Status: DC
  Administered 2023-06-22 – 2023-06-29 (×14): 2.5 mg via ORAL
  Filled 2023-06-22 (×14): qty 1

## 2023-06-22 MED ORDER — HEPARIN SODIUM (PORCINE) 1000 UNIT/ML DIALYSIS: 1000.0000 [IU] | INTRAMUSCULAR | Status: DC | PRN

## 2023-06-22 MED ORDER — PENTAFLUOROPROP-TETRAFLUOROETH EX AERO: 1.0000 | INHALATION_SPRAY | CUTANEOUS | Status: DC | PRN

## 2023-06-22 MED ORDER — TORSEMIDE 100 MG PO TABS
100.0000 mg | ORAL_TABLET | Freq: Every day | ORAL | Status: DC
  Administered 2023-06-23 – 2023-06-24 (×2): 100 mg via ORAL
  Filled 2023-06-22 (×2): qty 1

## 2023-06-22 MED ORDER — LIDOCAINE-PRILOCAINE 2.5-2.5 % EX CREA: 1.0000 | TOPICAL_CREAM | CUTANEOUS | Status: DC | PRN

## 2023-06-22 MED ORDER — ANTICOAGULANT SODIUM CITRATE 4% (200MG/5ML) IV SOLN: 5.0000 mL | Status: DC | PRN

## 2023-06-22 NOTE — Plan of Care (Signed)
  Problem: Fluid Volume: Goal: Ability to maintain a balanced intake and output will improve Outcome: Progressing   Problem: Health Behavior/Discharge Planning: Goal: Ability to identify and utilize available resources and services will improve Outcome: Progressing   Problem: Nutritional: Goal: Maintenance of adequate nutrition will improve Outcome: Progressing   Problem: Skin Integrity: Goal: Risk for impaired skin integrity will decrease Outcome: Progressing   Problem: Tissue Perfusion: Goal: Adequacy of tissue perfusion will improve Outcome: Progressing   Problem: Clinical Measurements: Goal: Will remain free from infection Outcome: Progressing Goal: Respiratory complications will improve Outcome: Progressing Goal: Cardiovascular complication will be avoided Outcome: Progressing   Problem: Activity: Goal: Risk for activity intolerance will decrease Outcome: Progressing   Problem: Safety: Goal: Ability to remain free from injury will improve Outcome: Progressing   Problem: Cardiac: Goal: Ability to achieve and maintain adequate cardiopulmonary perfusion will improve Outcome: Progressing

## 2023-06-22 NOTE — Progress Notes (Signed)
   06/22/23 1116  Vitals  Temp 98 F (36.7 C)  Pulse Rate 78  Resp 13  BP 115/67  SpO2 100 %  O2 Device Room Air  Oxygen Therapy  Patient Activity (if Appropriate) In bed  Pulse Oximetry Type Continuous  Oximetry Probe Site Changed No  During Treatment Monitoring  Blood Flow Rate (mL/min) 249 mL/min  Arterial Pressure (mmHg) -114.13 mmHg  Venous Pressure (mmHg) 115.95 mmHg  TMP (mmHg) 5.66 mmHg  Ultrafiltration Rate (mL/min) 693 mL/min  Dialysate Flow Rate (mL/min) 200 ml/min  Dialysate Potassium Concentration 3 (Simultaneous filing. User may not have seen previous data.)  Dialysate Calcium Concentration 2.5 (Simultaneous filing. User may not have seen previous data.)  Duration of HD Treatment -hour(s) 2.5 hour(s)  Cumulative Fluid Removed (mL) per Treatment  999.36  HD Safety Checks Performed Yes  Intra-Hemodialysis Comments Tolerated well;Tx completed  Dialysis Fluid Bolus Normal Saline   Received patient in bed to unit.  Alert and oriented.  Informed consent signed and in chart.   TX duration: 2.5  Patient tolerated well.  Transported back to the room  Alert, without acute distress.  Hand-off given to patient's nurse.   Access used: RIJ Access issues: None  Total UF removed: 1000 Medication(s) given: None    Mar Daring, LPN  [facility]

## 2023-06-22 NOTE — Assessment & Plan Note (Addendum)
 Continue with rivastigmine, mirtazapine and fluoxetine

## 2023-06-22 NOTE — Assessment & Plan Note (Signed)
 Follow-up as outpatient

## 2023-06-22 NOTE — Assessment & Plan Note (Signed)
Continue antiacid therapy with pantoprazole.  ?

## 2023-06-22 NOTE — Progress Notes (Addendum)
  Progress Note    06/22/2023 8:42 AM 1 Day Post-Op  Subjective: No complaints   Vitals:   06/22/23 0822 06/22/23 0826  BP: (!) 138/49 (!) 138/49  Pulse: 87 85  Resp: 18 14  Temp: 99 F (37.2 C)   SpO2: 97% 99%   Physical Exam: Lungs: Nonlabored Incisions: Left axilla and AC fossa incision without hematoma; right IJ TDC without hematoma or bleeding Extremities: 1+ palpable left radial pulse; palpable thrill throughout left upper arm AV graft Neurologic: A&O  CBC    Component Value Date/Time   WBC 14.4 (H) 06/21/2023 0304   RBC 2.89 (L) 06/21/2023 0304   HGB 8.3 (L) 06/21/2023 0304   HGB 9.4 (L) 04/18/2023 1434   HGB 9.3 (L) 03/14/2017 0810   HCT 25.7 (L) 06/21/2023 0304   HCT 26.4 (L) 07/21/2019 0809   HCT 29.2 (L) 03/14/2017 0810   PLT 221 06/21/2023 0304   PLT 235 04/18/2023 1434   PLT 292 03/14/2017 0810   PLT 351 10/11/2016 1139   MCV 88.9 06/21/2023 0304   MCV 81.3 03/14/2017 0810   MCH 28.7 06/21/2023 0304   MCHC 32.3 06/21/2023 0304   RDW 17.7 (H) 06/21/2023 0304   RDW 14.0 03/14/2017 0810   LYMPHSABS 1.0 05/17/2023 1149   LYMPHSABS 1.4 03/14/2017 0810   MONOABS 0.8 05/17/2023 1149   MONOABS 0.4 03/14/2017 0810   EOSABS 0.0 05/17/2023 1149   EOSABS 0.1 03/14/2017 0810   BASOSABS 0.0 05/17/2023 1149   BASOSABS 0.0 03/14/2017 0810    BMET    Component Value Date/Time   NA 138 06/22/2023 0232   NA 144 07/19/2020 1458   NA 140 03/14/2017 0810   K 3.6 06/22/2023 0232   K 3.8 03/14/2017 0810   CL 99 06/22/2023 0232   CO2 24 06/22/2023 0232   CO2 27 03/14/2017 0810   GLUCOSE 202 (H) 06/22/2023 0232   GLUCOSE 177 (H) 03/14/2017 0810   BUN 76 (H) 06/22/2023 0232   BUN 85 (HH) 07/19/2020 1458   BUN 35.6 (H) 03/14/2017 0810   CREATININE 3.69 (H) 06/22/2023 0232   CREATININE 2.31 (H) 10/25/2022 1355   CREATININE 1.8 (H) 03/14/2017 0810   CALCIUM 8.4 (L) 06/22/2023 0232   CALCIUM 8.3 (L) 06/18/2023 0334   CALCIUM 9.6 03/14/2017 0810   GFRNONAA 12  (L) 06/22/2023 0232   GFRNONAA 22 (L) 10/25/2022 1355   GFRNONAA 45 (L) 12/17/2013 1158   GFRAA 21 (L) 12/21/2019 0845   GFRAA 52 (L) 12/17/2013 1158    INR    Component Value Date/Time   INR 1.5 (H) 07/28/2022 1615     Intake/Output Summary (Last 24 hours) at 06/22/2023 0842 Last data filed at 06/21/2023 2229 Gross per 24 hour  Intake 500 ml  Output 810 ml  Net -310 ml     Assessment/Plan:  78 y.o. female is s/p right IJ TDC placement and left upper arm AV graft placement 1 Day Post-Op   Left hand well-perfused without signs or symptoms of steal syndrome; palpable left radial pulse Patent left arm AV graft with palpable thrill throughout Ok to restart Eliquis today Ok to begin cannulating left arm AV graft in 1 month.  Ok for discharge from vascular surgery standpoint.   Emilie Rutter, PA-C [facility] [phone] 06/22/2023 8:42 AM   I agree with the above.  Postoperative day #1.  Okay to restart Eliquis  Durene Cal

## 2023-06-22 NOTE — Plan of Care (Signed)
   Problem: Education: Goal: Ability to describe self-care measures that may prevent or decrease complications (Diabetes Survival Skills Education) will improve Outcome: Progressing Goal: Individualized Educational Video(s) Outcome: Progressing

## 2023-06-22 NOTE — Progress Notes (Addendum)
 Progress Note   Patient: [name] ZOX:[id number] DOB: 02-16-1946 DOA: 06/15/2023     6 DOS: the patient was seen and examined on 06/22/2023   Brief hospital course: 77/F with CAD, chronic diastolic CHF, CKD 4, history of DVT, renal vein thrombosis on Eliquis, CVA, depression, memory loss, type 2 diabetes mellitus, history of breast cancer, lupus was admitted 3/8-15 with volume overload, progressive CKD, followed by nephrology, diuresed and discharged home on oral diuretics, also seen by gastroenterology for worsening chronic anemia, EGD and colonoscopy were unremarkable, Eliquis resumed at discharge. -Back in the ED 3/29 with progressive dyspnea on exertion orthopnea lower extremity edema and weight gain. -In the ED hypertensive, otherwise vital signs stable, hemoglobin 7.0, creatinine 4.0, BUN 82, BNP 560, troponin 125, chest x-ray unremarkable -Admitted, started on IV diuretics, nephrology following -4/2, vascular surgery consult, plan to start HD  04/05 HD today   Assessment and Plan: * Acute on chronic heart failure with preserved ejection fraction (HFpEF) (HCC) Echocardiogram with preserved LV systolic function with EF 60 to 65%.  Patient had aggressive diuresis with no adequate response.  Currently on HD plus ultrafiltration for fluid removal. Limited medical therapy.   Chronic kidney disease (CKD), stage V (HCC) Progressed to ESRD no on renal replacement therapy per HD  Patient had HD today with good toleration  Has a left upper extremity fistula in place.   Anemia of chronic renal disease.  Continue EPO and IV iron  Follow cell count in am.   Metabolic bone disease, continue with sevelamer.   History of renal vein thrombosis in 2019 and deep vein thrombosis 2022  Resume anticoagulation with apixaban.   Essential hypertension Continue blood pressure monitoring.  Continue blood pressure control with amlodipine   Acute on chronic anemia 03./30 PRBC transfusion x  1 Continue close follow up cell count.  No signs of active bleeding   GERD (gastroesophageal reflux disease) Continue antiacid therapy with pantoprazole.   Type 2 diabetes mellitus (HCC) Continue glucose cover and monitoring with insulin sliding scale.  Patient is tolerating po well.   Gout flare Patient was treated with 3 days of prednisone with good toleration   CAD (coronary artery disease) SP PCI in 2000 No chest pain, no acute coronary syndrome   Depression Continue with rivastigmine, mirtazapine and fluoxetine   Breast cancer of lower-inner quadrant of right female breast (HCC) Follow up as outpatient.    Subjective: patient had HD today, she is feeling well, no chest pain or dyspnea, lower extremity edema is improving. At the time of my examination she was having numbness on her left arm and hand   Physical Exam: Vitals:   06/22/23 0349 06/22/23 0350 06/22/23 0351 06/22/23 0352  BP:   (!) 143/59   Pulse:   70 77  Resp: 12 (!) 24 16 18   Temp:   98.3 F (36.8 C)   TempSrc:   Oral   SpO2:   97% 97%  Weight:   76.3 kg   Height:       Neurology awake and alert ENT with mild pallor with no icterus Cardiovascular with S1 and S2 present and regular with no gallops, rubs or murmurs No JVD Positive lower extremity edema ++ Respiratory with no rales or wheezing, no rhonchi  Abdomen with no distention  Left upper extremity with fistula in place, positive bruit, no discoloration and adequate distal pulses. The hand temperature is mildly lower than the right  Data Reviewed:    Family Communication:  I spoke with patient's daughter at the bedside, we talked in detail about patient's condition, plan of care and prognosis and all questions were addressed.   Disposition: Status is: Inpatient Remains inpatient appropriate because: renal replacement therapy   Planned Discharge Destination: Home      Author: Coralie Keens, MD 06/22/2023 7:38 AM  For on call  review [email or web address].

## 2023-06-22 NOTE — Assessment & Plan Note (Signed)
 Echocardiogram with preserved LV systolic function with EF 60 to 65%.  Patient had aggressive diuresis with no adequate response.  Currently on HD plus ultrafiltration for fluid removal. Limited medical therapy.

## 2023-06-22 NOTE — Assessment & Plan Note (Addendum)
 Continue fluid management per HD

## 2023-06-22 NOTE — Assessment & Plan Note (Signed)
 Patient was treated with 3 days of prednisone with good toleration

## 2023-06-22 NOTE — Progress Notes (Addendum)
 [facility] NEPHROLOGY PROGRESS NOTE  Assessment/ Plan: Pt is a 78 y.o. yo female  with history of hypertension, renal vein thrombosis on Eliquis, stroke, type II DM, lupus, CKD 4-5 who was recently admitted for CHF exacerbation and progressive CKD presented back with worsening dyspnea on exertion and fluid overload.  I had discussed with the patient and her daughter about dialysis during recent hospitalization and made plan to manage medically with diuretics and outpatient follow-up.  Her daughter works at [employer]  # Progressive CKD 5 versus AKI on CKD,: With frequent hospitalization for fluid overload.  Responding to IV diuretics but not po and with worsening renal function d/w patient and family (Daughter on phone 4/2) at length recommend starting dialysis. S/p LUE AVG + TDC placement 4/4 with HD 1, HD 2 today.  CLIP to outpt HD - SW aware.     # CKD-MBD/hyperphosphatemia: Started sevelamer in the recent visit, monitor lab.  PTH 220.   #Anemia of CKD: Concern about GI bleed, underwent endoscopy t recently with no sign of bleeding.  Back on Eliquis.  Hb was 6.4 and now 8s. after transfusion.  Recent iron saturation 35% but down to 15% so redosed.  S/p aranesp 100 3/30.     # Hypertension/volume: [name] improved after diuresis.  Add daily torsemide 100.   #A fib: on eliquis - per VVS ok to resume today  We will continue to follow.  Remain inpt until outpt HD arrrangements made.   Subjective: Seen and examined in dialysis - 2nd tx today.  Had LUE AVG placed yest - no s/s steal, RIJ TDC.  No new complaints.   Objective Vital signs in last 24 hours: Vitals:   06/22/23 0830 06/22/23 0843 06/22/23 0900 06/22/23 0930  BP: (!) 138/49 (!) 146/59 (!) 145/58 (!) 127/53  Pulse: 85 81 81 81  Resp: 18 17 14 15   Temp:      TempSrc:      SpO2: 98% 99% 99% 100%  Weight:      Height:       Weight change: 1.23 kg  Intake/Output Summary (Last 24 hours) at 06/22/2023 0956 Last data filed at  06/21/2023 2229 Gross per 24 hour  Intake 500 ml  Output 810 ml  Net -310 ml       Labs: RENAL PANEL Recent Labs  Lab 06/17/23 0330 06/18/23 0334 06/19/23 0237 06/20/23 0254 06/21/23 0304 06/22/23 0232  NA 140 140 135 136 137 138  K 4.7 4.3 4.1 4.1 3.9 3.6  CL 105 104 100 98 98 99  CO2 25 25 24 22 23 24   GLUCOSE 95 103* 104* 147* 154* 202*  BUN 81* 84* 92* 103* 112* 76*  CREATININE 3.85* 4.30* 4.83* 4.85* 4.85* 3.69*  CALCIUM 8.8* 8.4*  8.3* 8.0* 8.5* 8.3* 8.4*  PHOS 4.1  --   --   --   --   --     Liver Function Tests: No results for input(s): "AST", "ALT", "ALKPHOS", "BILITOT", "PROT", "ALBUMIN" in the last 168 hours. No results for input(s): "LIPASE", "AMYLASE" in the last 168 hours. No results for input(s): "AMMONIA" in the last 168 hours. CBC: Recent Labs    01/31/23 1437 02/13/23 1332 02/28/23 1411 03/14/23 1450 04/04/23 1445 04/04/23 1446 04/18/23 1433 04/18/23 1434 05/08/23 1833 05/13/23 0358 05/13/23 0535 05/14/23 0802 05/28/23 0459 05/29/23 0735 06/17/23 0330 06/18/23 0334 06/19/23 0237 06/20/23 0254 06/21/23 0304  HGB 10.2*   < > 9.9*   < > 9.9*  --   --  9.4*   < >  --   --    < > 7.9*   < > 8.7* 8.1* 7.8* 8.7* 8.3*  MCV 87.7   < > 85.2   < > 85.9  --   --  86.4   < >  --   --    < > 87.1   < > 89.4 89.4 90.4 88.8 88.9  VITAMINB12 1,098*  --  833  --  700  --   --  835  --  1,040*  --   --   --   --   --   --   --   --   --   FOLATE  --   --   --   --   --   --   --   --   --   --  5.3*  --   --   --   --   --   --   --   --   FERRITIN  --   --   --   --   --  153 226  --   --  220  --   --  246  --  192  --   --   --   --   TIBC  --   --   --   --   --   --   --   --   --  206*  --   --  207*  --  267  --   --   --   --   IRON  --   --   --   --   --   --   --   --   --  46  --   --  73  --  40  --   --   --   --   RETICCTPCT  --   --   --   --   --   --   --   --   --  1.5  --   --   --   --   --   --   --   --   --    < > = values in this  interval not displayed.    Cardiac Enzymes: No results for input(s): "CKTOTAL", "CKMB", "CKMBINDEX", "TROPONINI" in the last 168 hours. CBG: Recent Labs  Lab 06/21/23 0547 06/21/23 1057 06/21/23 1449 06/21/23 2209 06/22/23 0556  GLUCAP 109* 110* 194* 244* 182*    Iron Studies:  No results for input(s): "IRON", "TIBC", "TRANSFERRIN", "FERRITIN" in the last 72 hours.  Studies/Results: DG C-Arm 1-60 Min-No Report Result Date: 06/21/2023 Fluoroscopy was utilized by the requesting physician.  No radiographic interpretation.     Medications: Infusions:  anticoagulant sodium citrate      Scheduled Medications:  amLODipine  10 mg Oral Daily   Chlorhexidine Gluconate Cloth  6 each Topical Q0600   colestipol  1 g Oral TID   FLUoxetine  20 mg Oral Daily   folic acid  1 mg Oral Daily   furosemide  80 mg Intravenous TID   gabapentin  300 mg Oral QHS   insulin aspart  0-5 Units Subcutaneous QHS   insulin aspart  0-6 Units Subcutaneous TID WC   lipase/protease/amylase  36,000 Units Oral TID AC   mirtazapine  7.5 mg Oral QHS   multivitamin  1 tablet Oral QHS  pantoprazole  40 mg Oral BID   polyethylene glycol  17 g Oral Daily   rivastigmine  1.5 mg Oral BID   sevelamer carbonate  800 mg Oral TID WC    have reviewed scheduled and prn medications.  Physical Exam: General:NAD, comfortable Heart:RRR, s1s2 nl Lungs:clear b/l, no crackle Abdomen:soft, Non-tender, non-distended Extremities: minimal edema, LUE AVG +t/b Neurology: Alert awake and following commands RIJ TDC c/d/i  Tyler Pita 06/22/2023,9:56 AM  LOS: 6 days

## 2023-06-22 NOTE — Assessment & Plan Note (Signed)
 SP PCI in 2000 No chest pain, no acute coronary syndrome

## 2023-06-22 NOTE — Assessment & Plan Note (Addendum)
 Progressed to ESRD on renal replacement therapy per HD  Left upper extremity fistula has been removed  Has right internal jugular tunneled HD cathter in place.  Torsemide  on non HD days, 100 mg   Anemia of chronic renal disease.  SP EPO and IV iron   Hgb 8.0   Metabolic bone disease, continue with sevelamer .   History of renal vein thrombosis in 2019 and deep vein thrombosis 2022  Anticoagulation with apixaban .   Left upper extremity with clinical signs of acute cellulitis resolving Fistula has been removed 04/13 OP note from fistula excision with no signs of local infection.  Stopped antibiotics 04/17.  Resumed antibiotics 04/19   With antibiotics improvement in fever and leukocytosis.   Left upper extremity MRI, noted extending laterally radially, from site of removed fistula region of inflammatory phlegmon, small curvilinear region of fluid measuring up to 1,8, 0,5 and 1,5 cm just anterior to the muscle at the biceps musculotendinous junction. May represent small developing abscess.  Case discussed with vascular surgery will continue antibiotic therapy, no indication for surgical intervention.   Continue with antibiotic therapy with Vancomycin  and transition from Zosyn  to Augmentin  to stop on 07/16/23  Discharge wbc is 10,5 and she has been afebrile for 24 hrs.  Follow up as outpatient.

## 2023-06-22 NOTE — Assessment & Plan Note (Addendum)
 Patient was placed on insulin  sliding scale for glucose cover and monitoring. Her glucose remained well controlled.

## 2023-06-22 NOTE — Hospital Course (Addendum)
 [name] was admitted to the hospital with the working diagnosis of heart failure exacerbation in the setting of progressive renal disease.   78 yo female with the past medical history of diastolic heart failure, CKD 5, history of DVT, renal vein thrombosis, depression, type 2 diabetes mellitus, history of breast cancer, and systemic erythematous lupus who presented with dyspnea.  Recent hospitalization for volume overload due to heart failure 03/08 to 03/15, she was diuresed and discharged home.  Unfortunately at home she had progressive dyspnea on exertion, bilateral lower extremity edema and rapid weight gain (20 lbs since her discharge from the hospital).  On her initial physical examination her blood pressure was 160/70, HR 94 RR 17 and 02 saturation 97%. Lungs with no wheezing or rhonchi, heart with S1 and S2 present and regular with no gallops, rubs or murmurs, respiratory with no wheezing or rhonchi, abdomen with no distention and positive pitting bilateral lower extremity edema   Na 140, K 5.0 CL 109, bicarbonate 20, glucose 147 bun 82 cr 4,0  BNP 560  High sensitive troponin 125 and 117  Wbc 11,4 hgb 7,0 plt 192   Chest radiograph with hypoinflation, with mild cardiomegaly, bilateral hilar vascular congestion with cephalization of the vasculature and small bilateral pleural effusions.   EKG 88 bpm, normal axis, right bundle branch block, sinus rhythm with no significant ST segment or T wave changes.   Patient was placed on IV furosemide  for diuresis, and then transitioned to oral loop diuretics.  Her renal function continue to worsened and decision was made to start patient on renal replacement therapy.   04/04 patient had right internal jugular tunneled HD catheter placed and left upper extremity fistula placed.  04/05 HD with good toleration.  04/06 improved volume status.   04/07 discharge has held due to low diastolic pressure. She developed signs of cellulitis on her left  upper extremity fistula site.  04/08 started on antibiotic therapy.  4/9-10 onwards, persistent severe left arm pain 4/12 vascular surgery consulted, plan for AV graft excision 4/14 s/p excision of AV graft  4/15 new leukocytosis, body aches, temp 99.0 fever workup initiated, started empirically on Vanco and Zosyn  04/16 hgb 6,6 ordered one unit PRBC transfusion, HD today.  04/17 discontinue antibiotic therapy 04/18 changed from IV heparin  to apixaban .  04/19 patient with recurrent fever and now worsening leukocytosis, resumed antibiotic therapy with IV vancomycin  and Zosyn , check left upper extremity MRI.   04/20 left upper extremity MRI with local inflammation possible small developing abscess.  04/21 patient has been afebrile for the last 24 hrs and no further leukocytosis. Vascular surgery evaluation recommended continue antibiotic therapy.  Patient will be transferred to CIR today.

## 2023-06-22 NOTE — Assessment & Plan Note (Addendum)
 Acute on chronic anemia of chronic renal disease.  03./30 PRBC transfusion x 1 04/16 PRBC transfusion x1

## 2023-06-23 DIAGNOSIS — N185 Chronic kidney disease, stage 5: Secondary | ICD-10-CM | POA: Diagnosis not present

## 2023-06-23 DIAGNOSIS — I2583 Coronary atherosclerosis due to lipid rich plaque: Secondary | ICD-10-CM

## 2023-06-23 DIAGNOSIS — C50311 Malignant neoplasm of lower-inner quadrant of right female breast: Secondary | ICD-10-CM

## 2023-06-23 DIAGNOSIS — Z170 Estrogen receptor positive status [ER+]: Secondary | ICD-10-CM

## 2023-06-23 DIAGNOSIS — I5033 Acute on chronic diastolic (congestive) heart failure: Secondary | ICD-10-CM | POA: Diagnosis not present

## 2023-06-23 DIAGNOSIS — M109 Gout, unspecified: Secondary | ICD-10-CM

## 2023-06-23 DIAGNOSIS — D649 Anemia, unspecified: Secondary | ICD-10-CM | POA: Diagnosis not present

## 2023-06-23 DIAGNOSIS — I10 Essential (primary) hypertension: Secondary | ICD-10-CM | POA: Diagnosis not present

## 2023-06-23 DIAGNOSIS — F32A Depression, unspecified: Secondary | ICD-10-CM

## 2023-06-23 DIAGNOSIS — I2510 Atherosclerotic heart disease of native coronary artery without angina pectoris: Secondary | ICD-10-CM

## 2023-06-23 DIAGNOSIS — K219 Gastro-esophageal reflux disease without esophagitis: Secondary | ICD-10-CM

## 2023-06-23 LAB — CBC
HCT: 26.2 % — ABNORMAL LOW (ref 36.0–46.0)
Hemoglobin: 8.1 g/dL — ABNORMAL LOW (ref 12.0–15.0)
MCH: 28.1 pg (ref 26.0–34.0)
MCHC: 30.9 g/dL (ref 30.0–36.0)
MCV: 91 fL (ref 80.0–100.0)
Platelets: 204 10*3/uL (ref 150–400)
RBC: 2.88 MIL/uL — ABNORMAL LOW (ref 3.87–5.11)
RDW: 18.1 % — ABNORMAL HIGH (ref 11.5–15.5)
WBC: 10.8 10*3/uL — ABNORMAL HIGH (ref 4.0–10.5)
nRBC: 0.4 % — ABNORMAL HIGH (ref 0.0–0.2)

## 2023-06-23 LAB — BASIC METABOLIC PANEL WITH GFR
Anion gap: 14 (ref 5–15)
BUN: 63 mg/dL — ABNORMAL HIGH (ref 8–23)
CO2: 25 mmol/L (ref 22–32)
Calcium: 8.2 mg/dL — ABNORMAL LOW (ref 8.9–10.3)
Chloride: 97 mmol/L — ABNORMAL LOW (ref 98–111)
Creatinine, Ser: 3.88 mg/dL — ABNORMAL HIGH (ref 0.44–1.00)
GFR, Estimated: 12 mL/min — ABNORMAL LOW (ref >60)
Glucose, Bld: 98 mg/dL (ref 70–99)
Potassium: 3.7 mmol/L (ref 3.5–5.1)
Sodium: 136 mmol/L (ref 135–145)

## 2023-06-23 LAB — GLUCOSE, CAPILLARY
Glucose-Capillary: 79 mg/dL (ref 70–99)
Glucose-Capillary: 155 mg/dL — ABNORMAL HIGH (ref 70–99)

## 2023-06-23 MED ORDER — AMLODIPINE BESYLATE 5 MG PO TABS: 5.0000 mg | ORAL_TABLET | Freq: Every day | ORAL | Status: DC

## 2023-06-23 NOTE — Evaluation (Signed)
 Clinical/Bedside Swallow Evaluation Patient Details  Name: [name] MRN: [id number] Date of Birth: [birth year]  Today's Date: 06/23/2023 Time: SLP Start Time (ACUTE ONLY): 1416 SLP Stop Time (ACUTE ONLY): 1436 SLP Time Calculation (min) (ACUTE ONLY): 20 min  Past Medical History:  Past Medical History:  Diagnosis Date   Abdominal discomfort    Chronic N/V/D. Presumptive dx Crohn's dx per elevated p ANCA. Failed Entocort and Pentasa. Sep 2003 - ileocolectomy c anastomosis per Dr Orson Slick 2/2 adhesions - path was hegative for Crohns. EGD, Sm bowel follow through (11/03), and an eteroclysis (10/03) were unrevealing. Cuases hypomag and hypocalcemia.   Adnexal mass 10/2001   s/p lap BSO (R ovarian fibroma) & lysis of adhesions   Allergy    Seasonal   Anemia    Multifactorial. Baseline HgB 10-11 ish. B12 def - 150 in 3/10. Fe Def - ferritin 35 3/10. Both are being repleted.   Breast cancer (HCC) 03/16/2013   right, 5 o'clock   CAD (coronary artery disease) 1996   1996 - PTCA and angioplasty diagonal branch. 2000 - Rotoblator & angiopllasty of diagonal. 2006 - subendocardial AMI, DES to proximal LAD.Marland Kitchen Also had 90% stenosis in distal apical LAD. EF 55 with apical hypokinesis. Indefinite ASA and Plavix.   CHF (congestive heart failure) (HCC)    Chronic kidney disease, stage 4 (severe) (HCC)    Chronic pain    CT 10/10 = Spinal stenosis L2 - S1.   COVID-19 11/02/2020   Diabetes mellitus    Insulin dependent   Diabetes mellitus type 2 in obese 05/03/2010   Managed on lantus and novolog. Has diabetic nephropathy. Metformin D/C'd 2012 2/2 creatinine.  No diabetic retinopathy per 3/11.    Gout    Hx of radiation therapy 06/02/13- 07/16/13   right rbeast 4500 cGy 25 sessions, right breast boost 1600 cGy in 8 sessions   Hyperlipidemia    Managed with both a statin and Welchol. Welchol stopped 2014 2/2 cost and started on fenofibrate    Hypertension    2006 B renal arteries patent. 2003 MRA -  no RAS. 2003 pheo W/U Dr Caryn Section reportedly negative.   Hypoxia 07/23/2017   Lupus    Lymphedema of breast    Personal history of radiation therapy 2015   Pneumonia of left lung due to infectious organism    RBBB    Renal vein thrombosis (HCC)    SBO (small bowel obstruction) (HCC) 09/17/2017   Secondary hyperparathyroidism (HCC)    Stroke (HCC)    Incidental finding MRI 2002 L lacunar infarct   Vitamin B12 deficiency    Vitamin D deficiency    Wears dentures    top   Past Surgical History:  Past Surgical History:  Procedure Laterality Date   ABDOMINAL HYSTERECTOMY     BILATERAL SALPINGOOPHORECTOMY  8/03   Lap BSO (R ovarian fibroma) and adhesion lysis   BIOPSY  09/08/2020   Procedure: BIOPSY;  Surgeon: Bernette Redbird, MD;  Location: [facility];  Service: Endoscopy;;   BOWEL RESECTION  2003   ileocolectomy with anastomosis 2/2 adhesions   BREAST BIOPSY Right 2015   BREAST BIOPSY Right 07/2014   BREAST LUMPECTOMY Right 04/22/2013   BREAST LUMPECTOMY WITH NEEDLE LOCALIZATION AND AXILLARY SENTINEL LYMPH NODE BX Right 04/22/2013   Procedure: BREAST LUMPECTOMY WITH NEEDLE LOCALIZATION AND AXILLARY SENTINEL LYMPH NODE BX;  Surgeon: Almond Lint, MD;  Location: [facility];  Service: General;  Laterality: Right;   CARDIAC CATHETERIZATION  2 stents   CHOLECYSTECTOMY     COLONOSCOPY     COLONOSCOPY N/A 05/30/2023   Procedure: COLONOSCOPY;  Surgeon: Kathi Der, MD;  Location: [facility];  Service: Gastroenterology;  Laterality: N/A;   ESOPHAGOGASTRODUODENOSCOPY N/A 09/08/2020   Procedure: ESOPHAGOGASTRODUODENOSCOPY (EGD);  Surgeon: Bernette Redbird, MD;  Location: [facility];  Service: Endoscopy;  Laterality: N/A;   ESOPHAGOGASTRODUODENOSCOPY N/A 05/30/2023   Procedure: EGD (ESOPHAGOGASTRODUODENOSCOPY);  Surgeon: Kathi Der, MD;  Location[facility];  Service: Gastroenterology;  Laterality: N/A;   HEMICOLECTOMY     R sided hemicolectomy   HERNIA REPAIR      Ventral hernia repair   PTCA  4/06   HPI:  Patient is a 78 yo female presenting to [facility] on 06/15/23 with worsening DOE and fluid overload. PMH:  CKD IV, HTN, CHF, CAD, DVT, CVA, DM2, gout, chronic gastritis, breast CA, dementia, depression. SIn in prior admission for globus. Endoscopy showed non obstructing Schiatzkis ring.    Assessment / Plan / Recommendation  Clinical Impression  Pt reports globus, tight feeling in chest with meals, primarily with beef and chicken. Pt confirms that she does not have a bottom denture, but she "mashes her meat with a fork." We talked about how the work that teeth do to pulverize meat is not the same as mashing with a fork. Pt also has a history of a non-obstructive Schiatzki's ring at the GE junction on endoscopy (could not see that it was dilatated in note). She has reported this problem in multiple admissions. We discussed  how there may be limited treatments or procedures to "fix" the problem, though further work up of esophageal motility may be beneficial for treatment. SLP offered some texture modification strategies to reduce globus events and suggested supplementing with liquid protein. Also reported to MD. Jaquelyn Bitter diet to dys 2 (minced and moist) and thin liquids. No SLP f/u needed as education is complete and any further helpful intervention would be medical. SLP Visit Diagnosis: Dysphagia, unspecified (R13.10)    Aspiration Risk  Risk for inadequate nutrition/hydration    Diet Recommendation Dysphagia 2 (Fine chop);Thin liquid    Liquid Administration via: Cup;Straw Medication Administration: Whole meds with liquid Supervision: Patient able to self feed Compensations: Follow solids with liquid Postural Changes: Seated upright at 90 degrees    Other  Recommendations Recommended Consults: Consider esophageal assessment Oral Care Recommendations: Oral care BID    Recommendations for follow up therapy are one component of a multi-disciplinary  discharge planning process, led by the attending physician.  Recommendations may be updated based on patient status, additional functional criteria and insurance authorization.  Follow up Recommendations No SLP follow up      Assistance Recommended at Discharge    Functional Status Assessment    Frequency and Duration            Prognosis        Swallow Study   General HPI: Patient is a 78 yo female presenting to [facility] on 06/15/23 with worsening DOE and fluid overload. PMH:  CKD IV, HTN, CHF, CAD, DVT, CVA, DM2, gout, chronic gastritis, breast CA, dementia, depression. SIn in prior admission for globus. Endoscopy showed non obstructing Schiatzkis ring. Type of Study: Bedside Swallow Evaluation Diet Prior to this Study: Thin liquids (Level 0);Dysphagia 3 (mechanical soft) Temperature Spikes Noted: No Respiratory Status: Room air History of Recent Intubation: No Behavior/Cognition: Alert;Cooperative;Pleasant mood Oral Cavity Assessment: Within Functional Limits Oral Care Completed by SLP: No Oral Cavity - Dentition: Dentures, top;Edentulous (no  bottom denture) Self-Feeding Abilities: Able to feed self Patient Positioning: Upright in chair Baseline Vocal Quality: Normal Volitional Cough: Strong Volitional Swallow: Able to elicit    Oral/Motor/Sensory Function Overall Oral Motor/Sensory Function: Within functional limits   Ice Chips     Thin Liquid Thin Liquid: Within functional limits    Nectar Thick Nectar Thick Liquid: Not tested   Honey Thick Honey Thick Liquid: Not tested   Puree Puree: Within functional limits   Solid     Solid: Not tested      Claudine Mouton 06/23/2023,3:08 PM

## 2023-06-23 NOTE — Progress Notes (Addendum)
 Progress Note   Patient: [name] VWU:[id number] DOB: Oct 03, 1945 DOA: 06/15/2023     7 DOS: the patient was seen and examined on 06/23/2023   Brief hospital course: [name] was admitted to the hospital with the working diagnosis of heart failure exacerbation in the setting of progressive renal disease.   78 yo female with the past medical history of diastolic heart failure, CKD 5, history of DVT, renal vein thrombosis, depression, type 2 diabetes mellitus, history of breast cancer, and systemic erythematous lupus who presented with dyspnea.  Recent hospitalization for volume overload due to heart failure 03/08 to 03/15, she was diuresed and discharged home.  Unfortunately at home she had progressive dyspnea on exertion, bilateral lower extremity edema and rapid weight gain (20 lbs since her discharge from the hospital).  On her initial physical examination her blood pressure was 160/70, HR 94 RR 17 and 02 saturation 97%. Lungs with no wheezing or rhonchi, heart with S1 and S2 present and regular with no gallops, rubs or murmurs, respiratory with no wheezing or rhonchi, abdomen with no distention and positive pitting bilateral lower extremity edema   Na 140, K 5.0 CL 109, bicarbonate 20, glucose 147 bun 82 cr 4,0  BNP 560  High sensitive troponin 125 and 117  Wbc 11,4 hgb 7,0 plt 192   Chest radiograph with hypoinflation, with mild cardiomegaly, bilateral hilar vascular congestion with cephalization of the vasculature and small bilateral pleural effusions.   EKG 88 bpm, normal axis, right bundle branch block, sinus rhythm with no significant ST segment or T wave changes.   Patient was placed on IV furosemide for diuresis, and then transitioned to oral loop diuretics.  Her renal function continue to worsened and decision was made to start patient on renal replacement therapy.   04/04 patient had right internal jugular tunneled HD catheter placed and left upper extremity fistula  placed.  04/05 HD with good toleration.  04/06 improved volume status.    Assessment and Plan: * Acute on chronic diastolic CHF (congestive heart failure) (HCC) Echocardiogram with preserved LV systolic function with EF 60 to 65%, moderate LVH, RV systolic function preserved, mild mitral valve regurgitation, mid tricuspid valve regurgitation, RVSP 33,9 mmHg, trivial pericardial effusion,    Volume status has improved.  Since admission patient has lost 9 Kg.   Limited medical therapy due to risk of hypotension.  Continue volume management with ultrafiltration   Chronic kidney disease (CKD), stage V (HCC) Progressed to ESRD no on renal replacement therapy per HD  Has a left upper extremity fistula in place.   Had HD yesterday, her BUM today is 76, K is 3,7 and serum bicarbonate at 25  Na 136   Anemia of chronic renal disease.  Continue EPO and IV iron  Hgb  8.1   Metabolic bone disease, continue with sevelamer.   History of renal vein thrombosis in 2019 and deep vein thrombosis 2022  Anticoagulation with apixaban.   Essential hypertension Continue blood pressure monitoring.  Will reduce dose of amlodipine to 5 mg to avoid hypotension.   Acute on chronic anemia 03./30 PRBC transfusion x 1 Stable hgb  No signs of active bleeding   GERD (gastroesophageal reflux disease) Continue antiacid therapy with pantoprazole.   Type 2 diabetes mellitus (HCC) Continue glucose cover and monitoring with insulin sliding scale.  Patient is tolerating po well.   Gout flare Patient was treated with 3 days of prednisone with good toleration   CAD (coronary artery disease)  SP PCI in 2000 No chest pain, no acute coronary syndrome   Depression Continue with rivastigmine, mirtazapine and fluoxetine   Breast cancer of lower-inner quadrant of right female breast (HCC) Follow up as outpatient.         Subjective: Patient with no chest pain or dyspnea, edema has been improving, no  dizziness or lightheadedness.   Physical Exam: Vitals:   06/22/23 2328 06/22/23 2329 06/23/23 0506 06/23/23 0843  BP: (!) 126/51  (!) 146/56 (!) 105/42  Pulse:   69 81  Resp: 18 16 15 18   Temp: 98.7 F (37.1 C)  97.6 F (36.4 C) 97.6 F (36.4 C)  TempSrc: Oral  Oral Oral  SpO2:   97% 99%  Weight:   71.7 kg   Height:       Neurology awake and alert ENT with mild pallor  Cardiovascular with S1 and S2 present and regular with no gallops, rubs or murmurs Respiratory with no rales or wheezing Abdomen with no distention  Trace lower extremity edema  Data Reviewed:    Family Communication: I spoke with patient's family at the bedside, we talked in detail about patient's condition, plan of care and prognosis and all questions were addressed.   Disposition: Status is: Inpatient Remains inpatient appropriate because: renal replacement therapy   Planned Discharge Destination: Home     Author: Coralie Keens, MD 06/23/2023 11:06 AM  For on call review [email or web address].

## 2023-06-23 NOTE — Plan of Care (Signed)
  Problem: Coping: Goal: Ability to adjust to condition or change in health will improve Outcome: Progressing   Problem: Tissue Perfusion: Goal: Adequacy of tissue perfusion will improve Outcome: Progressing   Problem: Health Behavior/Discharge Planning: Goal: Ability to manage health-related needs will improve Outcome: Progressing   Problem: Clinical Measurements: Goal: Respiratory complications will improve Outcome: Progressing Goal: Cardiovascular complication will be avoided Outcome: Progressing   Problem: Nutrition: Goal: Adequate nutrition will be maintained Outcome: Progressing

## 2023-06-23 NOTE — Progress Notes (Signed)
 Mobility Specialist Progress Note;   06/23/23 1455  Mobility  Activity Ambulated with assistance in hallway  Level of Assistance Standby assist, set-up cues, supervision of patient - no hands on  Assistive Device Front wheel walker  Distance Ambulated (ft) 220 ft  Activity Response Tolerated well  Mobility Referral Yes  Mobility visit 1 Mobility  Mobility Specialist Start Time (ACUTE ONLY) 1455  Mobility Specialist Stop Time (ACUTE ONLY) 1505  Mobility Specialist Time Calculation (min) (ACUTE ONLY) 10 min   Pt agreeable to mobility. Required no physical assistance during ambulation, SV. Only c/o lower back pain while ambulating. VSS throughout. Pt returned back to chair with all needs met. Family in room.   Caesar Bookman Mobility Specialist Please contact via SecureChat or [employer] [phone]

## 2023-06-23 NOTE — Progress Notes (Signed)
 Subjective  - POD #1  Patient states that her hand has been numb all night.  Her daughter is at the bedside states she has lost some fine motor control and had opening things.  She is not complaining of a significant amount of pain other than where her graft has been placed   Physical Exam:  Left radial pulse.  No significant hematoma antecubital crease.  She has appropriate grip strength in the left hand and the ability to open and close her hand without difficulty       Assessment/Plan:  POD #1  Steal syndrome versus nerve irritation from small hematoma: I had an extensive discussion with the family regarding our treatment options.  I suspect that having gone to dialysis 2 days in a row may have exacerbated her symptoms due to hypotension.  She did not report any issues overnight in between dialysis sessions.  Alternatively this could be nerve irritation from hematoma.  I do not think it is crystal-clear.  I do not think that she needs emergent graft excision however I did discuss with the family that that is a possibility.  I would like to see how she does as her volume status improves and we get her blood pressure a little higher, back to her baseline.  [name] 06/23/2023 8:02 AM --  Vitals:   06/22/23 2329 06/23/23 0506  BP:  (!) 146/56  Pulse:  69  Resp: 16 15  Temp:  97.6 F (36.4 C)  SpO2:  97%    Intake/Output Summary (Last 24 hours) at 06/23/2023 0802 Last data filed at 06/22/2023 1133 Gross per 24 hour  Intake --  Output 1000 ml  Net -1000 ml     Laboratory CBC    Component Value Date/Time   WBC 10.8 (H) 06/23/2023 0227   HGB 8.1 (L) 06/23/2023 0227   HGB 9.4 (L) 04/18/2023 1434   HGB 9.3 (L) 03/14/2017 0810   HCT 26.2 (L) 06/23/2023 0227   HCT 26.4 (L) 07/21/2019 0809   HCT 29.2 (L) 03/14/2017 0810   PLT 204 06/23/2023 0227   PLT 235 04/18/2023 1434   PLT 292 03/14/2017 0810   PLT 351 10/11/2016 1139    BMET    Component Value Date/Time    NA 136 06/23/2023 0227   NA 144 07/19/2020 1458   NA 140 03/14/2017 0810   K 3.7 06/23/2023 0227   K 3.8 03/14/2017 0810   CL 97 (L) 06/23/2023 0227   CO2 25 06/23/2023 0227   CO2 27 03/14/2017 0810   GLUCOSE 98 06/23/2023 0227   GLUCOSE 177 (H) 03/14/2017 0810   BUN 63 (H) 06/23/2023 0227   BUN 85 (HH) 07/19/2020 1458   BUN 35.6 (H) 03/14/2017 0810   CREATININE 3.88 (H) 06/23/2023 0227   CREATININE 2.31 (H) 10/25/2022 1355   CREATININE 1.8 (H) 03/14/2017 0810   CALCIUM 8.2 (L) 06/23/2023 0227   CALCIUM 8.3 (L) 06/18/2023 0334   CALCIUM 9.6 03/14/2017 0810   GFRNONAA 12 (L) 06/23/2023 0227   GFRNONAA 22 (L) 10/25/2022 1355   GFRNONAA 45 (L) 12/17/2013 1158   GFRAA 21 (L) 12/21/2019 0845   GFRAA 52 (L) 12/17/2013 1158    COAG Lab Results  Component Value Date   INR 1.5 (H) 07/28/2022   INR 1.3 (H) 02/03/2022   INR 1.3 (H) 11/05/2021   No results found for: "PTT"  Antibiotics Anti-infectives (From admission, onward)    None  Jorge Ny, M.D., [facility] [place] Office: [phone] Pager:  [phone]

## 2023-06-23 NOTE — Evaluation (Signed)
 Occupational Therapy Evaluation and DC Summary  Patient Details Name: [name] MRN: [id number] DOB: 1945/12/23 Today's Date: 06/23/2023   History of Present Illness   Patient is a 77 yo female presenting to [facility] on [facility]/15[facility]23 with worsening DOE and fluid overload. PMH:  CKD IV, HTN, CHF, CAD, DVT, CVA, DM2, gout, chronic gastritis, breast CA, dementia, depression.     Clinical Impressions Pt admitted for above, PTA pt was living with her family and mod I in home using rollator, ind with ADLs while family performs iADLs. Pt currently ambulating in hall with Supervision + RW and demonstrated ADL completion with setup/supervision. Re-educated pt on CHF diet and exacerbation prevention strategies, she verbalized understanding. Pt has no further acute skilled OT needs, no post acute OT recommended.      If plan is discharge home, recommend the following:   Assist for transportation;Direct supervision/assist for medications management;Direct supervision/assist for financial management     Functional Status Assessment   Patient has not had a recent decline in their functional status     Equipment Recommendations   None recommended by OT     Recommendations for Other Services         Precautions/Restrictions   Precautions Precautions: Fall Recall of Precautions/Restrictions: Intact Precaution/Restrictions Comments: memory impairment at baseline Restrictions Weight Bearing Restrictions Per Provider Order: No     Mobility Bed Mobility               General bed mobility comments: Pt up in chair on arrival    Transfers Overall transfer level: Needs assistance Equipment used: Rolling walker (2 wheels) Transfers: Sit to/from Stand Sit to Stand: Supervision                  Balance Overall balance assessment: Needs assistance Sitting-balance support: No upper extremity supported, Feet supported Sitting balance-Leahy Scale: Good     Standing  balance support: During functional activity, Reliant on assistive device for balance, Bilateral upper extremity supported, No upper extremity supported Standing balance-Leahy Scale: Fair                             ADL either performed or assessed with clinical judgement   ADL Overall ADL's : Needs assistance/impaired Eating/Feeding: Independent;Sitting   Grooming: Supervision/safety;Standing   Upper Body Bathing: Standing;Supervision/ safety Upper Body Bathing Details (indicate cue type and reason): UE support Lower Body Bathing: Sit to/from stand;Supervison/ safety Lower Body Bathing Details (indicate cue type and reason): UE support Upper Body Dressing : Set up;Sitting   Lower Body Dressing: Set up;Sitting/lateral leans   Toilet Transfer: Rolling walker (2 wheels);Supervision/safety;Ambulation   Toileting- Clothing Manipulation and Hygiene: Supervision/safety;Sit to/from stand       Functional mobility during ADLs: Supervision/safety;Rolling walker (2 wheels) General ADL Comments: Reinforced education of CHF diet and exacerbation prevention. Pt negotiated 2 steps with CGA, eduacted pt to go up the stairs facing rail and using it as support     Vision         Perception         Praxis         Pertinent Vitals/Pain Pain Assessment Pain Assessment: Faces Faces Pain Scale: Hurts little more Pain Location: low back Pain Descriptors / Indicators: Aching Pain Intervention(s): Monitored during session, Limited activity within patient's tolerance, Other (comment) (provided heat packs, pt reports her daughter will apply it)     Extremity/Trunk Assessment Upper Extremity Assessment Upper Extremity Assessment: Overall  WFL for tasks assessed   Lower Extremity Assessment Lower Extremity Assessment: Overall WFL for tasks assessed       Communication Communication Communication: No apparent difficulties   Cognition Arousal: Alert Behavior During Therapy:  WFL for tasks assessed/performed Cognition: History of cognitive impairments             OT - Cognition Comments: WFL today                 Following commands: Intact       Cueing  General Comments   Cueing Techniques: Verbal cues  VSS   Exercises     Shoulder Instructions      Home Living Family/patient expects to be discharged to:: Private residence Living Arrangements: Other relatives Available Help at Discharge: Family;Available PRN/intermittently Type of Home: House Home Access: Level entry     Home Layout: Two level;1/2 bath on main level;Able to live on main level with bedroom/bathroom Alternate Level Stairs-Number of Steps: 12 Alternate Level Stairs-Rails: Right Bathroom Shower/Tub: Chief Strategy Officer: Standard Bathroom Accessibility: Yes   Home Equipment: Agricultural consultant (2 wheels);Rollator (4 wheels);Cane - single point          Prior Functioning/Environment Prior Level of Function : Independent/Modified Independent             Mobility Comments: Rollator or RW ADLs Comments: daughter assists with med mgmt and driving. Ind with ADLs    OT Problem List: Decreased knowledge of precautions;Cardiopulmonary status limiting activity   OT Treatment/Interventions:        OT Goals(Current goals can be found in the care plan section)   Acute Rehab OT Goals Patient Stated Goal: To go home OT Goal Formulation: With patient Time For Goal Achievement: 07/07/23 Potential to Achieve Goals: Good   OT Frequency:       Co-evaluation              AM-PAC OT "6 Clicks" Daily Activity     Outcome Measure Help from another person eating meals?: None Help from another person taking care of personal grooming?: A Little Help from another person toileting, which includes using toliet, bedpan, or urinal?: A Little Help from another person bathing (including washing, rinsing, drying)?: A Little Help from another person to put on  and taking off regular upper body clothing?: A Little Help from another person to put on and taking off regular lower body clothing?: A Little 6 Click Score: 19   End of Session Equipment Utilized During Treatment: Gait belt;Rolling walker (2 wheels) Nurse Communication: Mobility status  Activity Tolerance: Patient tolerated treatment well Patient left: in chair;with call bell/phone within reach;with family/visitor present  OT Visit Diagnosis: Other (comment) (DOE)                Time: 1610-9604 OT Time Calculation (min): 27 min Charges:  OT General Charges $OT Visit: 1 Visit OT Evaluation $OT Eval Low Complexity: 1 Low OT Treatments $Therapeutic Activity: 8-22 mins  06/23/2023  AB, OTR/L  Acute Rehabilitation Services  Office: [phone]   Tristan Schroeder 06/23/2023, 11:01 AM

## 2023-06-23 NOTE — Progress Notes (Addendum)
 Progress Note    06/23/2023 9:14 AM 2 Days Post-Op  Subjective:  some numbness in L hand.  Stable from yesterday.  Patient believes this is currently tolerable   Vitals:   06/23/23 0506 06/23/23 0843  BP: (!) 146/56 (!) 105/42  Pulse: 69 81  Resp: 15 18  Temp: 97.6 F (36.4 C) 97.6 F (36.4 C)  SpO2: 97% 99%   Physical Exam: Lungs:  non labored Incisions:  small fluid collection at arterial anastomosis but soft; axilla incision is well appearing Extremities:  palpable L radial; palpable thrill through AVG; symmetrical grip strength Neurologic: A&O  CBC    Component Value Date/Time   WBC 10.8 (H) 06/23/2023 0227   RBC 2.88 (L) 06/23/2023 0227   HGB 8.1 (L) 06/23/2023 0227   HGB 9.4 (L) 04/18/2023 1434   HGB 9.3 (L) 03/14/2017 0810   HCT 26.2 (L) 06/23/2023 0227   HCT 26.4 (L) 07/21/2019 0809   HCT 29.2 (L) 03/14/2017 0810   PLT 204 06/23/2023 0227   PLT 235 04/18/2023 1434   PLT 292 03/14/2017 0810   PLT 351 10/11/2016 1139   MCV 91.0 06/23/2023 0227   MCV 81.3 03/14/2017 0810   MCH 28.1 06/23/2023 0227   MCHC 30.9 06/23/2023 0227   RDW 18.1 (H) 06/23/2023 0227   RDW 14.0 03/14/2017 0810   LYMPHSABS 1.0 05/17/2023 1149   LYMPHSABS 1.4 03/14/2017 0810   MONOABS 0.8 05/17/2023 1149   MONOABS 0.4 03/14/2017 0810   EOSABS 0.0 05/17/2023 1149   EOSABS 0.1 03/14/2017 0810   BASOSABS 0.0 05/17/2023 1149   BASOSABS 0.0 03/14/2017 0810    BMET    Component Value Date/Time   NA 136 06/23/2023 0227   NA 144 07/19/2020 1458   NA 140 03/14/2017 0810   K 3.7 06/23/2023 0227   K 3.8 03/14/2017 0810   CL 97 (L) 06/23/2023 0227   CO2 25 06/23/2023 0227   CO2 27 03/14/2017 0810   GLUCOSE 98 06/23/2023 0227   GLUCOSE 177 (H) 03/14/2017 0810   BUN 63 (H) 06/23/2023 0227   BUN 85 (HH) 07/19/2020 1458   BUN 35.6 (H) 03/14/2017 0810   CREATININE 3.88 (H) 06/23/2023 0227   CREATININE 2.31 (H) 10/25/2022 1355   CREATININE 1.8 (H) 03/14/2017 0810   CALCIUM 8.2 (L)  06/23/2023 0227   CALCIUM 8.3 (L) 06/18/2023 0334   CALCIUM 9.6 03/14/2017 0810   GFRNONAA 12 (L) 06/23/2023 0227   GFRNONAA 22 (L) 10/25/2022 1355   GFRNONAA 45 (L) 12/17/2013 1158   GFRAA 21 (L) 12/21/2019 0845   GFRAA 52 (L) 12/17/2013 1158    INR    Component Value Date/Time   INR 1.5 (H) 07/28/2022 1615     Intake/Output Summary (Last 24 hours) at 06/23/2023 0914 Last data filed at 06/23/2023 0830 Gross per 24 hour  Intake 120 ml  Output 1000 ml  Net -880 ml     Assessment/Plan:  78 y.o. female is s/p L arm AVG 2 Days Post-Op   L hand is well perfused with palpable radial pulse;  grip strength is symmetrical Numbness in hand is episodic but currently tolerable Encouraged patient to continue exercising L hand with stress ball; OT consulted We discussed options being L arm AVG graft excision vs continued conservative management.  She is not interested in graft excision currently   Emilie Rutter, PA-C [facility] [phone] 06/23/2023 9:14 AM   I agree with the above.  Subjectively she says her hand is  a little better.  She continues to have a palpable left radial pulse.  She is using a stress ball.  We will continue to monitor her progress.  Graft removal is still a possibility, however I would like to avoid that if at all possible.  Durene Cal

## 2023-06-23 NOTE — Progress Notes (Addendum)
 [facility] NEPHROLOGY PROGRESS NOTE  Assessment/ Plan: Pt is a 78 y.o. yo female  with history of hypertension, renal vein thrombosis on Eliquis, stroke, type II DM, lupus, CKD 4-5 who was recently admitted for CHF exacerbation and progressive CKD presented back with worsening dyspnea on exertion and fluid overload.  I had discussed with the patient and her daughter about dialysis during recent hospitalization and made plan to manage medically with diuretics and outpatient follow-up.  Her daughter works at [employer].  # Progressive CKD 5 versus AKI on CKD,: With frequent hospitalization for fluid overload.  Responding to IV diuretics but not po and with worsening renal function d/w patient and family (Daughter on phone 4/2) at length recommend starting dialysis. S/p LUE AVG + TDC placement 4/4 with HD 1, HD 2 4/5, next tomorrow with standing post wt.  CLIP to outpt HD - SW aware.     # CKD-MBD/hyperphosphatemia: Started sevelamer in the recent visit, monitor lab.  PTH 220.   #Anemia of CKD: Concern about GI bleed, underwent endoscopy t recently with no sign of bleeding.  Back on Eliquis.  Hb was 6.4 and now 8s. after transfusion.  Recent iron saturation 35% but down to 15% so redosed.  S/p aranesp 100 3/30.     # Hypertension/volume: [name] improved after diuresis.  Added daily torsemide 100.   #A fib: on eliquis - per [facility]VS ok to resume  We will continue to follow.  Remain inpt until outpt HD arrrangements made.   Subjective: Seen and examined in  room.  Several fam members present and OT.  She's doing well.   Denies dyspnea, orthopnea.  Eating well.  No s/s steal LUE AVG.  Objective Vital signs in last 24 hours: Vitals:   06/22/23 2328 06/22/23 2329 06/23/23 0506 06/23/23 0843  BP: (!) 126/51  (!) 146/56 (!) 105/42  Pulse:   69 81  Resp: 18 16 15 18   Temp: 98.7 F (37.1 C)  97.6 F (36.4 C) 97.6 F (36.4 C)  TempSrc: Oral  Oral Oral  SpO2:   97% 99%  Weight:   71.7 kg    Height:       Weight change: -2.4 kg  Intake/Output Summary (Last 24 hours) at 06/23/2023 1013 Last data filed at 06/23/2023 0830 Gross per 24 hour  Intake 120 ml  Output 1000 ml  Net -880 ml       Labs: RENAL PANEL Recent Labs  Lab 06/17/23 0330 06/18/23 0334 06/19/23 0237 06/20/23 0254 06/21/23 0304 06/22/23 0232 06/23/23 0227  NA 140   < > 135 136 137 138 136  K 4.7   < > 4.1 4.1 3.9 3.6 3.7  CL 105   < > 100 98 98 99 97*  CO2 25   < > 24 22 23 24 25   GLUCOSE 95   < > 104* 147* 154* 202* 98  BUN 81*   < > 92* 103* 112* 76* 63*  CREATININE 3.85*   < > 4.83* 4.85* 4.85* 3.69* 3.88*  CALCIUM 8.8*   < > 8.0* 8.5* 8.3* 8.4* 8.2*  PHOS 4.1  --   --   --   --   --   --    < > = values in this interval not displayed.    Liver Function Tests: No results for input(s): "AST", "ALT", "ALKPHOS", "BILITOT", "PROT", "ALBUMIN" in the last 168 hours. No results for input(s): "LIPASE", "AMYLASE" in the last 168 hours. No results for  input(s): "AMMONIA" in the last 168 hours. CBC: Recent Labs    01/31/23 1437 02/13/23 1332 02/28/23 1411 03/14/23 1450 04/04/23 1445 04/04/23 1446 04/18/23 1433 04/18/23 1434 05/08/23 1833 05/13/23 0358 05/13/23 0535 05/14/23 0802 05/28/23 0459 05/29/23 0735 06/17/23 0330 06/18/23 0334 06/19/23 0237 06/20/23 0254 06/21/23 0304 06/23/23 0227  HGB 10.2*   < > 9.9*   < > 9.9*  --   --  9.4*   < >  --   --    < > 7.9*   < > 8.7* 8.1* 7.8* 8.7* 8.3* 8.1*  MCV 87.7   < > 85.2   < > 85.9  --   --  86.4   < >  --   --    < > 87.1   < > 89.4 89.4 90.4 88.8 88.9 91.0  VITAMINB12 1,098*  --  833  --  700  --   --  835  --  1,040*  --   --   --   --   --   --   --   --   --   --   FOLATE  --   --   --   --   --   --   --   --   --   --  5.3*  --   --   --   --   --   --   --   --   --   FERRITIN  --   --   --   --   --  153 226  --   --  220  --   --  246  --  192  --   --   --   --   --   TIBC  --   --   --   --   --   --   --   --   --  206*  --    --  207*  --  267  --   --   --   --   --   IRON  --   --   --   --   --   --   --   --   --  46  --   --  73  --  40  --   --   --   --   --   RETICCTPCT  --   --   --   --   --   --   --   --   --  1.5  --   --   --   --   --   --   --   --   --   --    < > = values in this interval not displayed.    Cardiac Enzymes: No results for input(s): "CKTOTAL", "CKMB", "CKMBINDEX", "TROPONINI" in the last 168 hours. CBG: Recent Labs  Lab 06/22/23 0556 06/22/23 1256 06/22/23 1622 06/22/23 2049 06/23/23 0626  GLUCAP 182* 103* 184* 110* 79    Iron Studies:  No results for input(s): "IRON", "TIBC", "TRANSFERRIN", "FERRITIN" in the last 72 hours.  Studies/Results: DG Chest Port 1 View Result Date: 06/22/2023 CLINICAL DATA:  Dialysis catheter insertion EXAM: PORTABLE CHEST 1 VIEW COMPARISON:  06/15/2023 FINDINGS: Right internal jugular dialysis catheter has been placed with the tip at the cavoatrial junction. No pneumothorax. Heart upper limits normal in size. No confluent  airspace opacities or effusions. No acute bony abnormality. IMPRESSION: Right dialysis catheter tip at the cavoatrial junction. No pneumothorax. No active disease. Electronically Signed   By: Charlett Nose M.D.   On: 06/22/2023 10:07   DG C-Arm 1-60 Min-No Report Result Date: 06/21/2023 Fluoroscopy was utilized by the requesting physician.  No radiographic interpretation.     Medications: Infusions:    Scheduled Medications:  amLODipine  10 mg Oral Daily   apixaban  2.5 mg Oral BID   Chlorhexidine Gluconate Cloth  6 each Topical Q0600   colestipol  1 g Oral TID   FLUoxetine  20 mg Oral Daily   folic acid  1 mg Oral Daily   gabapentin  300 mg Oral QHS   insulin aspart  0-5 Units Subcutaneous QHS   lipase/protease/amylase  36,000 Units Oral TID AC   mirtazapine  7.5 mg Oral QHS   multivitamin  1 tablet Oral QHS   pantoprazole  40 mg Oral BID   polyethylene glycol  17 g Oral Daily   rivastigmine  1.5 mg Oral BID    sevelamer carbonate  800 mg Oral TID WC   torsemide  100 mg Oral Daily    have reviewed scheduled and prn medications.  Physical Exam: General:NAD, comfortable Heart:RRR, s1s2 nl Lungs:clear b/l, no crackle Abdomen:soft, Non-tender, non-distended Extremities: minimal edema, LUE AVG +t/b, 2+ radial pulse Neurology: Alert awake and following commands RIJ TDC c/d/i  Tyler Pita 06/23/2023,10:13 AM  LOS: 7 days

## 2023-06-24 ENCOUNTER — Encounter (HOSPITAL_COMMUNITY): Payer: Self-pay | Admitting: Vascular Surgery

## 2023-06-24 DIAGNOSIS — I5033 Acute on chronic diastolic (congestive) heart failure: Secondary | ICD-10-CM | POA: Diagnosis not present

## 2023-06-24 DIAGNOSIS — I10 Essential (primary) hypertension: Secondary | ICD-10-CM | POA: Diagnosis not present

## 2023-06-24 DIAGNOSIS — N185 Chronic kidney disease, stage 5: Secondary | ICD-10-CM | POA: Diagnosis not present

## 2023-06-24 DIAGNOSIS — D649 Anemia, unspecified: Secondary | ICD-10-CM | POA: Diagnosis not present

## 2023-06-24 LAB — RENAL FUNCTION PANEL
Albumin: 2.1 g/dL — ABNORMAL LOW (ref 3.5–5.0)
Anion gap: 14 (ref 5–15)
BUN: 83 mg/dL — ABNORMAL HIGH (ref 8–23)
CO2: 23 mmol/L (ref 22–32)
Calcium: 8.3 mg/dL — ABNORMAL LOW (ref 8.9–10.3)
Chloride: 98 mmol/L (ref 98–111)
Creatinine, Ser: 4.71 mg/dL — ABNORMAL HIGH (ref 0.44–1.00)
GFR, Estimated: 9 mL/min — ABNORMAL LOW (ref >60)
Glucose, Bld: 93 mg/dL (ref 70–99)
Phosphorus: 3.9 mg/dL (ref 2.5–4.6)
Potassium: 4.2 mmol/L (ref 3.5–5.1)
Sodium: 135 mmol/L (ref 135–145)

## 2023-06-24 LAB — GLUCOSE, CAPILLARY
Glucose-Capillary: 265 mg/dL — ABNORMAL HIGH (ref 70–99)
Glucose-Capillary: 161 mg/dL — ABNORMAL HIGH (ref 70–99)

## 2023-06-24 MED ORDER — VASOPRESSIN 20 UNIT/ML IV SOLN
INTRAVENOUS | Status: AC
  Filled 2023-06-24: qty 1

## 2023-06-24 MED ORDER — MIDODRINE HCL 5 MG PO TABS
5.0000 mg | ORAL_TABLET | Freq: Three times a day (TID) | ORAL | Status: DC
  Administered 2023-06-24: 5 mg via ORAL
  Filled 2023-06-24: qty 1

## 2023-06-24 MED ORDER — PROPOFOL 1000 MG/100ML IV EMUL
INTRAVENOUS | Status: AC
  Filled 2023-06-24: qty 100

## 2023-06-24 MED ORDER — TORSEMIDE 20 MG PO TABS
100.0000 mg | ORAL_TABLET | ORAL | Status: DC
  Administered 2023-06-25 – 2023-07-07 (×8): 100 mg via ORAL
  Filled 2023-06-24: qty 5
  Filled 2023-06-24 (×5): qty 1
  Filled 2023-06-24 (×2): qty 5
  Filled 2023-06-24: qty 1

## 2023-06-24 MED ORDER — HEPARIN SODIUM (PORCINE) 1000 UNIT/ML IJ SOLN
INTRAMUSCULAR | Status: AC
Start: 2023-06-24 — End: 2023-06-24
  Filled 2023-06-24: qty 3

## 2023-06-24 MED ORDER — ACETAMINOPHEN 325 MG PO TABS
650.0000 mg | ORAL_TABLET | Freq: Four times a day (QID) | ORAL | Status: DC | PRN
  Administered 2023-06-25 – 2023-07-07 (×10): 650 mg via ORAL
  Filled 2023-06-24 (×10): qty 2

## 2023-06-24 MED ORDER — INSULIN ASPART 100 UNIT/ML IJ SOLN
0.0000 [IU] | Freq: Three times a day (TID) | INTRAMUSCULAR | Status: DC
  Administered 2023-06-24 – 2023-06-25 (×2): 3 [IU] via SUBCUTANEOUS
  Administered 2023-06-27 – 2023-07-05 (×4): 1 [IU] via SUBCUTANEOUS
  Administered 2023-07-06: 2 [IU] via SUBCUTANEOUS
  Administered 2023-07-07: 1 [IU] via SUBCUTANEOUS

## 2023-06-24 MED ORDER — TORSEMIDE 100 MG PO TABS: ORAL_TABLET | ORAL | 0 refills | Status: DC

## 2023-06-24 MED ORDER — PHENYLEPHRINE HCL-NACL 20-0.9 MG/250ML-% IV SOLN
INTRAVENOUS | Status: AC
  Filled 2023-06-24: qty 750

## 2023-06-24 NOTE — Discharge Planning (Addendum)
  [facility] Dialysis Discharge Orders    Patient Name: [name]  Admission Date: 06/15/2023 Discharge Date: 06/24/23 Dialysis Unit: [facility] - TCU (will be on MTuThF schedule - 11:15am)  Admitting Diagnosis: Acute on chronic heart failure with preserved ejection fraction (HFpEF) (HCC) [I50.33] new ESRD - 1st HD 4/4, cause of HD: HTN v. cardiorenal syndrome  Other PMHx: Anemia of CKD Hx recent GI bleed A-fib on Eliquis HTN SLE T2DM Hx CVA Hx renal vein thrombosis   Discharge Labs: Basic Metabolic Panel: Recent Labs  Lab 06/22/23 0232 06/23/23 0227 06/24/23 0255  NA 138 136 135  K 3.6 3.7 4.2  CL 99 97* 98  CO2 24 25 23   GLUCOSE 202* 98 93  BUN 76* 63* 83*  CREATININE 3.69* 3.88* 4.71*  CALCIUM 8.4* 8.2* 8.3*  PHOS  --   --  3.9   CBC: Recent Labs  Lab 06/18/23 0334 06/19/23 0237 06/20/23 0254 06/21/23 0304 06/23/23 0227  WBC 10.5 10.9* 13.5* 14.4* 10.8*  HGB 8.1* 7.8* 8.7* 8.3* 8.1*  HCT 26.1* 25.4* 27.7* 25.7* 26.2*  MCV 89.4 90.4 88.8 88.9 91.0  PLT 177 169 196 221 204    Dialysis Orders: 3 hours, 4d/week 180 dialyzer BFR 400 DFR A1.5 EDW 72kg 2K/2.5Ca bath TDC (placed 06/21/23) no UF or Na profile  Medications: Heparin 2000 unit bolus q HD Mircera IV q 2 weeks - start 4/8, last given Aranesp on 3/30 Venofer - per protocol. Tsat 21% and was given 2 doses venofer 200mg  here Calcitriol - per protocol. PTH 220 on 06/18/23  Other/Appts/Lab orders: - S/p LUE AVG placed 4/4 - follow-up appt pending.   Julien Nordmann, PA-C 06/24/2023, 1:47 PM  [employer] [phone]

## 2023-06-24 NOTE — Progress Notes (Signed)
 I spoke with patient's daughter/ POA, she does not feel comfortable taking patient  home today due to low blood pressure.  Will continue blood pressure monitoring and plan for discharge home tomorrow.

## 2023-06-24 NOTE — Progress Notes (Addendum)
 Pt for likely d/c today per attending and nephrologist. Contacted TCU RN to be advised that pt will likely d/c today and should start tomorrow. Contacted renal PA to request that orders be sent to TCU this afternoon. Met with pt's daughters on Friday to discuss out-pt HD arrangements and added to AVS as well.   Olivia Canter Renal Navigator [phone]  Addendum at 3:02 pm: Advised by attending that pt will not d/c today. Contacted TCU RN to be advised that pt will not d/c today and will not be starting tomorrow. Will assist as needed.

## 2023-06-24 NOTE — Progress Notes (Signed)
 [facility] NEPHROLOGY PROGRESS NOTE  Assessment/ Plan: Pt is a 78 y.o. yo female  with history of hypertension, renal vein thrombosis on Eliquis, stroke, type II DM, lupus, CKD 4-5 who was recently admitted for CHF exacerbation and progressive CKD presented back with worsening dyspnea on exertion and fluid overload.   # Progressive CKD 5 versus AKI on CKD,: With frequent hospitalization for fluid overload.  Responding to IV diuretics but not po and with worsening renal function d/w patient and family (Daughter on phone 4/2) at length recommend starting dialysis. S/p LUE AVG + TDC placement 4/4 with HD 1, HD 2 4/5,  - HD today 06/24/23 - CLIP complete   # CKD-MBD/hyperphosphatemia: Started sevelamer in the recent visit, monitor lab.  PTH 220.   #Anemia of CKD: Concern about GI bleed, underwent endoscopy t recently with no sign of bleeding.  Back on Eliquis.  Hb was 6.4 and now 8s. after transfusion.  Recent iron saturation 35% but down to 15% so redosed.  S/p aranesp 100 3/30.     # Hypertension/volume: [name] improved after diuresis.  Added daily torsemide 100.   #A fib: on eliquis - per VVS ok to resume  We will continue to follow.  Remain inpt until outpt HD arrrangements made.   Subjective:  Seen on dialysis.  No issues today.  CLIP complete.    Objective Vital signs in last 24 hours: Vitals:   06/24/23 0938 06/24/23 1000 06/24/23 1030 06/24/23 1100  BP: (!) 110/49 (!) 116/55 (!) 127/51 (!) 109/56  Pulse: 75 75 76 77  Resp: 15 15 13 15   Temp:      TempSrc:      SpO2: 100% 99% 100% 100%  Weight:      Height:       Weight change: 1.717 kg  Intake/Output Summary (Last 24 hours) at 06/24/2023 1127 Last data filed at 06/24/2023 0830 Gross per 24 hour  Intake 240 ml  Output --  Net 240 ml       Labs: RENAL PANEL Recent Labs  Lab 06/20/23 0254 06/21/23 0304 06/22/23 0232 06/23/23 0227 06/24/23 0255  NA 136 137 138 136 135  K 4.1 3.9 3.6 3.7 4.2  CL 98 98  99 97* 98  CO2 22 23 24 25 23   GLUCOSE 147* 154* 202* 98 93  BUN 103* 112* 76* 63* 83*  CREATININE 4.85* 4.85* 3.69* 3.88* 4.71*  CALCIUM 8.5* 8.3* 8.4* 8.2* 8.3*  PHOS  --   --   --   --  3.9  ALBUMIN  --   --   --   --  2.1*    Liver Function Tests: Recent Labs  Lab 06/24/23 0255  ALBUMIN 2.1*   No results for input(s): "LIPASE", "AMYLASE" in the last 168 hours. No results for input(s): "AMMONIA" in the last 168 hours. CBC: Recent Labs    01/31/23 1437 02/13/23 1332 02/28/23 1411 03/14/23 1450 04/04/23 1445 04/04/23 1446 04/18/23 1433 04/18/23 1434 05/08/23 1833 05/13/23 0358 05/13/23 0535 05/14/23 0802 05/28/23 0459 05/29/23 0735 06/17/23 0330 06/18/23 0334 06/19/23 0237 06/20/23 0254 06/21/23 0304 06/23/23 0227  HGB 10.2*   < > 9.9*   < > 9.9*  --   --  9.4*   < >  --   --    < > 7.9*   < > 8.7* 8.1* 7.8* 8.7* 8.3* 8.1*  MCV 87.7   < > 85.2   < > 85.9  --   --  86.4   < >  --   --    < > 87.1   < > 89.4 89.4 90.4 88.8 88.9 91.0  VITAMINB12 1,098*  --  833  --  700  --   --  835  --  1,040*  --   --   --   --   --   --   --   --   --   --   FOLATE  --   --   --   --   --   --   --   --   --   --  5.3*  --   --   --   --   --   --   --   --   --   FERRITIN  --   --   --   --   --  153 226  --   --  220  --   --  246  --  192  --   --   --   --   --   TIBC  --   --   --   --   --   --   --   --   --  206*  --   --  207*  --  267  --   --   --   --   --   IRON  --   --   --   --   --   --   --   --   --  46  --   --  73  --  40  --   --   --   --   --   RETICCTPCT  --   --   --   --   --   --   --   --   --  1.5  --   --   --   --   --   --   --   --   --   --    < > = values in this interval not displayed.    Cardiac Enzymes: No results for input(s): "CKTOTAL", "CKMB", "CKMBINDEX", "TROPONINI" in the last 168 hours. CBG: Recent Labs  Lab 06/22/23 1256 06/22/23 1622 06/22/23 2049 06/23/23 0626 06/23/23 2224  GLUCAP 103* 184* 110* 79 155*    Iron  Studies:  No results for input(s): "IRON", "TIBC", "TRANSFERRIN", "FERRITIN" in the last 72 hours.  Studies/Results: No results found.    Medications: Infusions:    Scheduled Medications:  apixaban  2.5 mg Oral BID   Chlorhexidine Gluconate Cloth  6 each Topical Q0600   colestipol  1 g Oral TID   FLUoxetine  20 mg Oral Daily   folic acid  1 mg Oral Daily   gabapentin  300 mg Oral QHS   insulin aspart  0-5 Units Subcutaneous QHS   lipase/protease/amylase  36,000 Units Oral TID AC   midodrine  5 mg Oral TID WC   mirtazapine  7.5 mg Oral QHS   multivitamin  1 tablet Oral QHS   pantoprazole  40 mg Oral BID   polyethylene glycol  17 g Oral Daily   rivastigmine  1.5 mg Oral BID   sevelamer carbonate  800 mg Oral TID WC   torsemide  100 mg Oral Daily    have reviewed scheduled and prn medications.  Physical Exam: General:NAD, comfortable Heart:RRR, s1s2  nl Lungs:clear b/l, no crackle Abdomen:soft, Non-tender, non-distended Extremities: minimal edema, LUE AVG +t/b, 2+ radial pulse Neurology: Alert awake and following commands RIJ TDC c/d/i  [name] 06/24/2023,11:27 AM  LOS: 8 days

## 2023-06-24 NOTE — Discharge Summary (Signed)
 Physician Discharge Summary   Patient: [name] MRN: [id number] DOB: [birth year]  Admit date:     06/15/2023  Discharge date: 06/24/23  Discharge Physician: Coralie Keens   PCP: Renford Dills, MD   Recommendations at discharge:    Patient has been initiated on renal replacement therapy with schedule Tuesday, Thursday and Friday  Amlodipine has been discontinued due to risk of hypotension.  Follow up with Dr Nehemiah Settle in 7 to 10 days  Follow up with Nephrology as scheduled.   Discharge Diagnoses: Principal Problem:   Acute on chronic diastolic CHF (congestive heart failure) (HCC) Active Problems:   Chronic kidney disease (CKD), stage V (HCC)   Essential hypertension   Acute on chronic anemia   GERD (gastroesophageal reflux disease)   Type 2 diabetes mellitus (HCC)   Gout flare   CAD (coronary artery disease)   Depression   Breast cancer of lower-inner quadrant of right female breast (HCC)  Resolved Problems:   * No resolved hospital problems. [facility] Course: Mrs. Guidotti was admitted to the hospital with the working diagnosis of heart failure exacerbation in the setting of progressive renal disease.   78 yo female with the past medical history of diastolic heart failure, CKD 5, history of DVT, renal vein thrombosis, depression, type 2 diabetes mellitus, history of breast cancer, and systemic erythematous lupus who presented with dyspnea.  Recent hospitalization for volume overload due to heart failure 03/08 to 03/15, she was diuresed and discharged home.  Unfortunately at home she had progressive dyspnea on exertion, bilateral lower extremity edema and rapid weight gain (20 lbs since her discharge from the hospital).  On her initial physical examination her blood pressure was 160/70, HR 94 RR 17 and 02 saturation 97%. Lungs with no wheezing or rhonchi, heart with S1 and S2 present and regular with no gallops, rubs or murmurs, respiratory with no wheezing or  rhonchi, abdomen with no distention and positive pitting bilateral lower extremity edema   Na 140, K 5.0 CL 109, bicarbonate 20, glucose 147 bun 82 cr 4,0  BNP 560  High sensitive troponin 125 and 117  Wbc 11,4 hgb 7,0 plt 192   Chest radiograph with hypoinflation, with mild cardiomegaly, bilateral hilar vascular congestion with cephalization of the vasculature and small bilateral pleural effusions.   EKG 88 bpm, normal axis, right bundle branch block, sinus rhythm with no significant ST segment or T wave changes.   Patient was placed on IV furosemide for diuresis, and then transitioned to oral loop diuretics.  Her renal function continue to worsened and decision was made to start patient on renal replacement therapy.   04/04 patient had right internal jugular tunneled HD catheter placed and left upper extremity fistula placed.  04/05 HD with good toleration.  04/06 improved volume status.    Assessment and Plan: * Acute on chronic diastolic CHF (congestive heart failure) (HCC) Echocardiogram with preserved LV systolic function with EF 60 to 65%, moderate LVH, RV systolic function preserved, mild mitral valve regurgitation, mid tricuspid valve regurgitation, RVSP 33,9 mmHg, trivial pericardial effusion,    Volume status has improved.  Since admission patient has lost 8 Kg.   Limited medical therapy due to risk of hypotension.  Continue volume management with ultrafiltration   Chronic kidney disease (CKD), stage V (HCC) Progressed to ESRD no on renal replacement therapy per HD  Has a left upper extremity fistula in place.   Had HD today, with pre HD BUN 83, with K  at 4,2 and serum bicarbonate at 23   Na 135   Anemia of chronic renal disease.  SP EPO and IV iron  Hgb  8.1   Metabolic bone disease, continue with sevelamer.   History of renal vein thrombosis in 2019 and deep vein thrombosis 2022  Anticoagulation with apixaban.   Essential hypertension Improved blood pressure,  currently amlodipine has been discontinued due to risk of hypotension.  Continue fluid management per HD   Acute on chronic anemia Acute on chronic anemia of chronic renal disease.  03./30 PRBC transfusion x 1 Stable hgb  No signs of active bleeding   GERD (gastroesophageal reflux disease) Continue antiacid therapy with pantoprazole.   Type 2 diabetes mellitus (HCC) Patient was placed on insulin sliding scale for glucose cover and monitoring.  Fasting glucose at the time of discharge was 93 mg/dl   Gout flare Patient was treated with 3 days of prednisone with good toleration   CAD (coronary artery disease) SP PCI in 2000 No chest pain, no acute coronary syndrome   Depression Continue with rivastigmine, mirtazapine and fluoxetine   Breast cancer of lower-inner quadrant of right female breast (HCC) Follow up as outpatient.        Consultants: nephrology, vascular surgery  Procedures performed: HD tunneled catheter implantation and AV fistula creation   Disposition: Home Diet recommendation:  Cardiac and Carb modified diet dysphagia 2 diet.  DISCHARGE MEDICATION: Allergies as of 06/24/2023       Reactions   Anesthetics, Halogenated Other (See Comments)   Per patient's daughter, she can not tolerate anesthetics.    Etomidate Other (See Comments)   Heart stopped during anesthesia   Fentanyl Other (See Comments)   Heart stopped during anesthesia   Percocet [oxycodone-acetaminophen] Other (See Comments)   Headaches   Diazepam Other (See Comments)   Agitation   Lorazepam Nausea Only   Tramadol Hcl Nausea And Vomiting   Haloperidol Nausea And Vomiting   **Haldol**   Morphine Sulfate Nausea Only   agitation   Propoxyphene Nausea And Vomiting   **Darvocet** no longer available   Versed [midazolam] Other (See Comments)   Heart stopped during anesthesia        Medication List     STOP taking these medications    amLODipine 10 MG tablet Commonly known as:  NORVASC   furosemide 40 MG tablet Commonly known as: LASIX   HYDROcodone-acetaminophen 5-325 MG tablet Commonly known as: NORCO/VICODIN   loperamide 2 MG capsule Commonly known as: IMODIUM   nitroGLYCERIN 0.4 MG SL tablet Commonly known as: NITROSTAT   sodium bicarbonate 650 MG tablet       TAKE these medications    acetaminophen 650 MG CR tablet Commonly known as: TYLENOL Take 1,300 mg by mouth every 8 (eight) hours as needed for pain.   colestipol 1 g tablet Commonly known as: COLESTID Take 1 tablet (1 g total) by mouth 3 (three) times daily.   diphenoxylate-atropine 2.5-0.025 MG tablet Commonly known as: Lomotil Take 1 tablet by mouth 2 (two) times daily as needed for diarrhea or loose stools.   Eliquis 2.5 MG Tabs tablet Generic drug: apixaban Take 1 tablet (2.5 mg total) by mouth 2 (two) times daily.   famotidine 20 MG tablet Commonly known as: PEPCID Take 20 mg by mouth daily as needed for heartburn or indigestion.   FLUoxetine 20 MG capsule Commonly known as: PROZAC Take 20 mg by mouth daily.   folic acid 1 MG tablet Commonly known as:  FOLVITE Take 1 tablet (1 mg total) by mouth daily.   gabapentin 300 MG capsule Commonly known as: NEURONTIN Take 300 mg by mouth at bedtime.   lipase/protease/amylase 09811 UNITS Cpep capsule Commonly known as: CREON Take 1 capsule (36,000 Units total) by mouth 3 (three) times daily before meals.   mirtazapine 7.5 MG tablet Commonly known as: REMERON Take 7.5 mg by mouth at bedtime.   pantoprazole 40 MG tablet Commonly known as: PROTONIX Take 1 tablet (40 mg total) by mouth 2 (two) times daily. What changed: when to take this   rivastigmine 1.5 MG capsule Commonly known as: EXELON Take 1 capsule (1.5 mg total) by mouth 2 (two) times daily.   sevelamer carbonate 800 MG tablet Commonly known as: RENVELA Take 1 tablet (800 mg total) by mouth 3 (three) times daily with meals.   tiZANidine 2 MG  tablet Commonly known as: ZANAFLEX Take 2 mg by mouth at bedtime as needed for muscle spasms.   torsemide 100 MG tablet Commonly known as: DEMADEX Take on non hemodialysis days, take on Wednesday, Saturday and Friday. Start taking on: June 25, 2023        Follow-up Information     [facility] Follow up.   Specialty: Home Health Services Why: Agency will call you to set up apt times Contact information: [address] [place] [address] [phone]         Renford Dills, MD. Go on 06/27/2023.   Specialty: Internal Medicine Why: @2 :30pm Contact information: [address] [place] [address] [phone]         [facility]. Go on 06/25/2023.   Why: Transitional Care Unit- Monday, Tuesday, Thursday, Friday with 11:15 am chair  time.  On the first day, please arrive at 10:30 am to complete paperwork prior to treatment. Contact information: [address] [place] [address] [phone]         [facility] Follow up.   Specialty: Vascular Surgery Why: As needed Contact information: [address] [place] [address] [phone]               Discharge Exam: Ceasar Mons Weights   06/23/23 0506 06/24/23 0417 06/24/23 1202  Weight: 71.7 kg 75.6 kg 72.1 kg   BP (!) 152/46 (BP Location: Right Arm)   Pulse 84   Temp 99.3 F (37.4 C) (Oral)   Resp 15   Ht (P) 5\' 4"  (1.626 m)   Wt 72.1 kg Comment: Standing scale weight  SpO2 100%   BMI (P) 27.28 kg/m   Patient is feeling better, no chest pain or dyspnea, she tolerated HD well today   Neurology awake and alert ENT with mild pallor with no icterus Cardiovascular with S1 and S2 present and regular with no gallops or rubs Respiratory with no rales or wheezing, no rhonchi  Abdomen with no distention  No lower extremity edema   Condition at discharge: stable  The results of significant  diagnostics from this hospitalization (including imaging, microbiology, ancillary and laboratory) are listed below for reference.   Imaging Studies: DG Chest Port 1 View Result Date: 06/22/2023 CLINICAL DATA:  Dialysis catheter insertion EXAM: PORTABLE CHEST 1 VIEW COMPARISON:  06/15/2023 FINDINGS: Right internal jugular dialysis catheter has been placed with the tip at the cavoatrial junction. No pneumothorax. Heart upper limits normal in size. No confluent airspace opacities or effusions. No acute bony abnormality. IMPRESSION: Right dialysis catheter  tip at the cavoatrial junction. No pneumothorax. No active disease. Electronically Signed   By: Charlett Nose M.D.   On: 06/22/2023 10:07   DG C-Arm 1-60 Min-No Report Result Date: 06/21/2023 Fluoroscopy was utilized by the requesting physician.  No radiographic interpretation.   VAS [place] UPPER EXT VEIN MAPPING (PRE-OP AVF) Result Date: 06/19/2023 UPPER EXTREMITY VEIN MAPPING Patient Name:  Hani Swaziland Olver  Date of Exam:   06/19/2023 Medical Rec #: [id number]           Accession #:    [id number] Date of Birth: [birth year]           Patient Gender: F Patient Age:   86 years Exam Location:  [facility] Procedure:      VAS [place] UPPER EXT VEIN MAPPING (PRE-OP AVF) Referring Phys: Estill Bakes --------------------------------------------------------------------------------  Indications: Pre-access. Comparison Study: None. Performing Technologist: Shona Simpson  Examination Guidelines: A complete evaluation includes B-mode imaging, spectral Doppler, color Doppler, and power Doppler as needed of all accessible portions of each vessel. Bilateral testing is considered an integral part of a complete examination. Limited examinations for reoccurring indications may be performed as noted. +-----------------+-------------+----------+--------------+ Right Cephalic   Diameter (cm)Depth (cm)   Findings    +-----------------+-------------+----------+--------------+  Shoulder             0.25        1.22                  +-----------------+-------------+----------+--------------+ Prox upper arm       0.22        0.87     branching    +-----------------+-------------+----------+--------------+ Mid upper arm        0.22        0.45                  +-----------------+-------------+----------+--------------+ Dist upper arm       0.15        0.34                  +-----------------+-------------+----------+--------------+ Antecubital fossa    0.26        0.27                  +-----------------+-------------+----------+--------------+ Prox forearm         0.18        0.25                  +-----------------+-------------+----------+--------------+ Mid forearm          0.07        0.24                  +-----------------+-------------+----------+--------------+ Dist forearm                            not visualized +-----------------+-------------+----------+--------------+ Wrist                                   not visualized +-----------------+-------------+----------+--------------+ +-----------------+-------------+----------+--------------+ Right Basilic    Diameter (cm)Depth (cm)   Findings    +-----------------+-------------+----------+--------------+ Shoulder                                not visualized +-----------------+-------------+----------+--------------+ Prox upper arm  not visualized +-----------------+-------------+----------+--------------+ Mid upper arm                           not visualized +-----------------+-------------+----------+--------------+ Dist upper arm       0.31        1.53                  +-----------------+-------------+----------+--------------+ Antecubital fossa    0.32        0.69     branching    +-----------------+-------------+----------+--------------+ Prox forearm         0.25        0.17                   +-----------------+-------------+----------+--------------+ Mid forearm          0.25        0.26                  +-----------------+-------------+----------+--------------+ Distal forearm       0.25        0.22                  +-----------------+-------------+----------+--------------+ Wrist                0.18        0.18                  +-----------------+-------------+----------+--------------+ +-----------------+-------------+----------+-----------------------------+ Left Cephalic    Diameter (cm)Depth (cm)          Findings            +-----------------+-------------+----------+-----------------------------+ Shoulder             0.32        1.44                                 +-----------------+-------------+----------+-----------------------------+ Prox upper arm       0.29        0.84                                 +-----------------+-------------+----------+-----------------------------+ Mid upper arm        0.25        0.36             branching           +-----------------+-------------+----------+-----------------------------+ Dist upper arm       0.25        0.39             branching           +-----------------+-------------+----------+-----------------------------+ Antecubital fossa    0.25        0.37                                 +-----------------+-------------+----------+-----------------------------+ Prox forearm         0.21        0.46             branching           +-----------------+-------------+----------+-----------------------------+ Mid forearm          0.12        0.57             branching           +-----------------+-------------+----------+-----------------------------+  Dist forearm                            not visualized and obstructed +-----------------+-------------+----------+-----------------------------+ Wrist                                   not visualized and obstructed  +-----------------+-------------+----------+-----------------------------+ +-----------------+-------------+----------+--------------+ Left Basilic     Diameter (cm)Depth (cm)   Findings    +-----------------+-------------+----------+--------------+ Shoulder                                not visualized +-----------------+-------------+----------+--------------+ Prox upper arm                          not visualized +-----------------+-------------+----------+--------------+ Mid upper arm                           not visualized +-----------------+-------------+----------+--------------+ Dist upper arm       0.32        1.20                  +-----------------+-------------+----------+--------------+ Antecubital fossa    1.70        0.50     branching    +-----------------+-------------+----------+--------------+ Prox forearm         0.15        0.44     branching    +-----------------+-------------+----------+--------------+ Mid forearm          0.15        0.30                  +-----------------+-------------+----------+--------------+ Distal forearm       0.09        1.74                  +-----------------+-------------+----------+--------------+ Wrist                0.13        0.26                  +-----------------+-------------+----------+--------------+ *See table(s) above for measurements and observations.  Diagnosing physician: Coral Else MD Electronically signed by Coral Else MD on 06/19/2023 at 7:00:51 PM.    Final    DG Chest 2 View Result Date: 06/15/2023 CLINICAL DATA:  Dyspnea on exertion. EXAM: CHEST - 2 VIEW COMPARISON:  May 25, 2023. FINDINGS: Stable cardiomediastinal silhouette. Both lungs are clear. The visualized skeletal structures are unremarkable. IMPRESSION: No active cardiopulmonary disease. Electronically Signed   By: Lupita Raider M.D.   On: 06/15/2023 19:23   US RENAL Result Date: 05/28/2023 CLINICAL DATA:  Chronic kidney  disease. EXAM: RENAL / URINARY TRACT ULTRASOUND COMPLETE COMPARISON:  04/08/2023, 02/03/2022. FINDINGS: Right Kidney: Renal measurements: 10.3 x 5.5 x 5.5 cm = volume: 165.4 mL. Increased echogenicity is noted. Cysts are present measuring 1.0 x 0.9 x 0.8 cm and 1.2 x 1.0 x 1.1 cm. No mass or hydronephrosis visualized. Left Kidney: Renal measurements: 10.1 x 4.9 x 5.9 cm = volume: 151.7 mL. Increased echogenicity is noted. Cysts are present measuring 2.3 x 1.5 x 2.2 cm and 1.0 x 0.9 x 0.9 cm. No mass or hydronephrosis visualized. Bladder: Appears normal for degree of bladder distention. Bilateral ureteral jets are noted.  Other: None. IMPRESSION: Increased renal parenchymal echogenicity and bilateral renal cysts, compatible with medical renal disease. Electronically Signed   By: Thornell Sartorius M.D.   On: 05/28/2023 21:01   ECHOCARDIOGRAM COMPLETE Result Date: 05/26/2023    ECHOCARDIOGRAM REPORT   Patient Name:   Indica Swaziland Piscopo Date of Exam: 05/26/2023 Medical Rec #:  [id number]          Height:       64.0 in Accession #:    [id number]         Weight:       158.5 lb Date of Birth:  1945/12/27          BSA:          1.772 m Patient Age:    77 years           BP:           157/68 mmHg Patient Gender: F                  HR:           85 bpm. Exam Location:  Inpatient Procedure: 2D Echo, Color Doppler and Cardiac Doppler (Both Spectral and Color            Flow Doppler were utilized during procedure). Indications:    CHF-Acute Systolic  History:        Patient has prior history of Echocardiogram examinations, most                 recent 10/24/2021. CHF, CAD, Signs/Symptoms:Shortness of Breath;                 Risk Factors:Hypertension and Diabetes.  Sonographer:    Rosaland Lao Sonographer#2:  Delcie Roch RDCS Referring Phys: [phone] EKTA V PATEL IMPRESSIONS  1. Left ventricular ejection fraction, by estimation, is 60 to 65%. The left ventricle has normal function. The left ventricle has no regional wall motion  abnormalities. There is moderate left ventricular hypertrophy. Left ventricular diastolic parameters are consistent with Grade I diastolic dysfunction (impaired relaxation).  2. Right ventricular systolic function is normal. The right ventricular size is normal. There is normal pulmonary artery systolic pressure.  3. The mitral valve is abnormal. Mild mitral valve regurgitation. No evidence of mitral stenosis.  4. The aortic valve is tricuspid. Aortic valve regurgitation is not visualized. No aortic stenosis is present.  5. The inferior vena cava is normal in size with greater than 50% respiratory variability, suggesting right atrial pressure of 3 mmHg. FINDINGS  Left Ventricle: Left ventricular ejection fraction, by estimation, is 60 to 65%. The left ventricle has normal function. The left ventricle has no regional wall motion abnormalities. The left ventricular internal cavity size was normal in size. There is  moderate left ventricular hypertrophy. Left ventricular diastolic parameters are consistent with Grade I diastolic dysfunction (impaired relaxation). Right Ventricle: The right ventricular size is normal. Right vetricular wall thickness was not well visualized. Right ventricular systolic function is normal. There is normal pulmonary artery systolic pressure. The tricuspid regurgitant velocity is 2.78 m/s, and with an assumed right atrial pressure of 3 mmHg, the estimated right ventricular systolic pressure is 33.9 mmHg. Left Atrium: Left atrial size was normal in size. Right Atrium: Right atrial size was normal in size. Pericardium: Trivial pericardial effusion is present. The pericardial effusion is circumferential. Mitral Valve: The mitral valve is abnormal. Mild mitral valve regurgitation. No evidence of mitral valve stenosis. Tricuspid Valve: The tricuspid valve is normal  in structure. Tricuspid valve regurgitation is mild . No evidence of tricuspid stenosis. Aortic Valve: The aortic valve is tricuspid.  Aortic valve regurgitation is not visualized. No aortic stenosis is present. Aortic valve mean gradient measures 3.1 mmHg. Aortic valve peak gradient measures 7.1 mmHg. Aortic valve area, by VTI measures 2.02 cm. Pulmonic Valve: The pulmonic valve was not well visualized. Pulmonic valve regurgitation is not visualized. No evidence of pulmonic stenosis. Aorta: The aortic root and ascending aorta are structurally normal, with no evidence of dilitation. Venous: The inferior vena cava is normal in size with greater than 50% respiratory variability, suggesting right atrial pressure of 3 mmHg. IAS/Shunts: No atrial level shunt detected by color flow Doppler.  LEFT VENTRICLE PLAX 2D LVIDd:         4.40 cm LVIDs:         2.70 cm LV PW:         1.30 cm LV IVS:        1.20 cm LVOT diam:     2.10 cm LV SV:         58 LV SV Index:   33 LVOT Area:     3.46 cm  RIGHT VENTRICLE             IVC RV Basal diam:  3.90 cm     IVC diam: 1.40 cm RV S prime:     15.60 cm/s TAPSE (M-mode): 2.2 cm LEFT ATRIUM           Index        RIGHT ATRIUM           Index LA diam:      3.90 cm 2.20 cm/m   RA Area:     12.70 cm LA Vol (A2C): 42.3 ml 23.87 ml/m  RA Volume:   25.10 ml  14.16 ml/m LA Vol (A4C): 58.3 ml 32.90 ml/m  AORTIC VALVE AV Area (Vmax):    2.36 cm AV Area (Vmean):   2.64 cm AV Area (VTI):     2.02 cm AV Vmax:           133.45 cm/s AV Vmean:          81.657 cm/s AV VTI:            0.288 m AV Peak Grad:      7.1 mmHg AV Mean Grad:      3.1 mmHg LVOT Vmax:         91.10 cm/s LVOT Vmean:        62.300 cm/s LVOT VTI:          0.168 m LVOT/AV VTI ratio: 0.58  AORTA Ao Root diam: 2.60 cm Ao Asc diam:  3.10 cm MITRAL VALVE               TRICUSPID VALVE MV Area (PHT): 3.53 cm    TR Peak grad:   30.9 mmHg MV Decel Time: 215 msec    TR Vmax:        278.00 cm/s MV E velocity: 70.20 cm/s MV A velocity: 97.50 cm/s  SHUNTS MV E/A ratio:  0.72        Systemic VTI:  0.17 m                            Systemic Diam: 2.10 cm Dina Rich  MD Electronically signed by Dina Rich MD Signature Date/Time: 05/26/2023/2:46:41 PM    Final  Microbiology: Results for orders placed or performed during the hospital encounter of 05/25/23  Calprotectin, Fecal     Status: Abnormal   Collection Time: 05/27/23  6:43 PM   Specimen: STOOL  Result Value Ref Range Status   Calprotectin, Fecal 242 (H) 0 - 120 ug/g Final    Comment: (NOTE) Concentration     Interpretation   Follow-Up < 5 - 50 ug/g     Normal           None >50 -120 ug/g     Borderline       Re-evaluate in 4-6 weeks    >120 ug/g     Abnormal         Repeat as clinically                                   indicated Performed At: [facility] [address] [place], [place] [id number] Jolene Schimke MD OZ:[id number]    *Note: Due to a large number of results and/or encounters for the requested time period, some results have not been displayed. A complete set of results can be found in Results Review.    Labs: CBC: Recent Labs  Lab 06/18/23 0334 06/19/23 0237 06/20/23 0254 06/21/23 0304 06/23/23 0227  WBC 10.5 10.9* 13.5* 14.4* 10.8*  HGB 8.1* 7.8* 8.7* 8.3* 8.1*  HCT 26.1* 25.4* 27.7* 25.7* 26.2*  MCV 89.4 90.4 88.8 88.9 91.0  PLT 177 169 196 221 204   Basic Metabolic Panel: Recent Labs  Lab 06/20/23 0254 06/21/23 0304 06/22/23 0232 06/23/23 0227 06/24/23 0255  NA 136 137 138 136 135  K 4.1 3.9 3.6 3.7 4.2  CL 98 98 99 97* 98  CO2 22 23 24 25 23   GLUCOSE 147* 154* 202* 98 93  BUN 103* 112* 76* 63* 83*  CREATININE 4.85* 4.85* 3.69* 3.88* 4.71*  CALCIUM 8.5* 8.3* 8.4* 8.2* 8.3*  PHOS  --   --   --   --  3.9   Liver Function Tests: Recent Labs  Lab 06/24/23 0255  ALBUMIN 2.1*   CBG: Recent Labs  Lab 06/22/23 1256 06/22/23 1622 06/22/23 2049 06/23/23 0626 06/23/23 2224  GLUCAP 103* 184* 110* 79 155*    Discharge time spent: greater than 30 minutes.  Signed: Coralie Keens, MD [facility] 06/24/2023

## 2023-06-24 NOTE — Progress Notes (Signed)
 Patient developed a fever tonight. There are no other new signs or symptoms noted and no other SIRS criteria currently met. Plan to culture blood, check CBC, and follow clinical course.

## 2023-06-24 NOTE — Plan of Care (Signed)
  Problem: Education: Goal: Knowledge of General Education information will improve Description: Including pain rating scale, medication(s)/side effects and non-pharmacologic comfort measures Outcome: Progressing   Problem: Pain Managment: Goal: General experience of comfort will improve and/or be controlled Outcome: Progressing   Problem: Skin Integrity: Goal: Risk for impaired skin integrity will decrease Outcome: Progressing

## 2023-06-24 NOTE — Progress Notes (Addendum)
 [facility] [place]  Subjective  - Numbness in the finger tips   Objective (!) 114/38 74 98.3 F (36.8 C) (Oral) 18 97%  Intake/Output Summary (Last 24 hours) at 06/24/2023 0758 Last data filed at 06/23/2023 1700 Gross per 24 hour  Intake 300 ml  Output --  Net 300 ml    Palpable radial pulse and thrill in graft left UE Grip 4+/5 on the left UE No ischemic skin changes, hand warm Left AC incision healing well   Assessment/Planning: ESRD S/P left UE AV graft placement 06/21/23 Mild steal symptoms with intact M/V, numbness in finger tips.  She will continue to exercise her hand daily and [place] the left UE for daily activities. No plans for graft excision, she will continue with the above plan.  If she develops pain that is intolerable, loss of motor or ischemic changes the graft will have to be removed.    BP 140-114 systolic asymptomatic    Mosetta Pigeon 06/24/2023 7:58 AM --  Laboratory Lab Results: Recent Labs    06/23/23 0227  WBC 10.8*  HGB 8.1*  HCT 26.2*  PLT 204   BMET Recent Labs    06/23/23 0227 06/24/23 0255  NA 136 135  K 3.7 4.2  CL 97* 98  CO2 25 23  GLUCOSE 98 93  BUN 63* 83*  CREATININE 3.88* 4.71*  CALCIUM 8.2* 8.3*    COAG Lab Results  Component Value Date   INR 1.5 (H) 07/28/2022   INR 1.3 (H) 02/03/2022   INR 1.3 (H) 11/05/2021   No results found for: "PTT"  I have independently interviewed and examined patient and agree with PA assessment and plan above.  Her left radial pulse remains 1+ with numbness in her fingertips although the hand is very warm.  Will not plan for graft excision at this time and she will continue dialysis via [facility].  If her symptoms worsen we would excise the graft and she would likely remain TDC dependent.  [name] C. Randie Heinz, MD [facility] [place] Office: [phone] Pager: [phone]

## 2023-06-24 NOTE — Procedures (Signed)
 Patient seen and examined on Hemodialysis. The procedure was supervised and I have made appropriate changes. BP (!) 109/56   Pulse 77   Temp 98.3 F (36.8 C)   Resp 15   Ht (P) 5\' 4"  (1.626 m)   Wt 75.6 kg   SpO2 100%   BMI (P) 28.61 kg/m   QB 400 mL/ min via TDC, UF goal 2L  Tolerating treatment without complaints at this time.   Bufford Buttner MD [facility] [phone] 11:29 AM

## 2023-06-24 NOTE — Progress Notes (Signed)
 Received patient in bed to unit.  Alert and oriented.  Informed consent signed and in chart.   TX duration: 3  Patient tolerated well.  Transported back to the room  Alert, without acute distress.  Hand-off given to patient's nurse.   Access used: Right [facility] Access issues: None  Total UF removed: 1000 ml Medication(s) given: See [facility]   06/24/23 1202  Vitals  Temp 98.3 F (36.8 C)  Pulse Rate 79  Resp 13  BP (!) 131/54  SpO2 99 %  Weight 72.1 kg (Standing scale weight)  Type of Weight Actual  Oxygen Therapy  Patient Activity (if Appropriate) In bed  Pulse Oximetry Type Continuous  Post Treatment  Dialyzer Clearance Clear  Hemodialysis Intake (mL) 0 mL  Liters Processed 54  Fluid Removed (mL) 1000 mL  Tolerated HD Treatment Yes  Post-Hemodialysis Comments Pt tolerated tx well, no complications noted with [facility]

## 2023-06-25 DIAGNOSIS — D649 Anemia, unspecified: Secondary | ICD-10-CM | POA: Diagnosis not present

## 2023-06-25 DIAGNOSIS — I10 Essential (primary) hypertension: Secondary | ICD-10-CM | POA: Diagnosis not present

## 2023-06-25 DIAGNOSIS — I5033 Acute on chronic diastolic (congestive) heart failure: Secondary | ICD-10-CM | POA: Diagnosis not present

## 2023-06-25 DIAGNOSIS — N185 Chronic kidney disease, stage 5: Secondary | ICD-10-CM | POA: Diagnosis not present

## 2023-06-25 LAB — GLUCOSE, CAPILLARY
Glucose-Capillary: 108 mg/dL — ABNORMAL HIGH (ref 70–99)
Glucose-Capillary: 124 mg/dL — ABNORMAL HIGH (ref 70–99)
Glucose-Capillary: 290 mg/dL — ABNORMAL HIGH (ref 70–99)
Glucose-Capillary: 157 mg/dL — ABNORMAL HIGH (ref 70–99)

## 2023-06-25 LAB — CBC
HCT: 26.4 % — ABNORMAL LOW (ref 36.0–46.0)
Hemoglobin: 8.2 g/dL — ABNORMAL LOW (ref 12.0–15.0)
MCH: 28.6 pg (ref 26.0–34.0)
MCHC: 31.1 g/dL (ref 30.0–36.0)
MCV: 92 fL (ref 80.0–100.0)
Platelets: 184 10*3/uL (ref 150–400)
RBC: 2.87 MIL/uL — ABNORMAL LOW (ref 3.87–5.11)
RDW: 18.1 % — ABNORMAL HIGH (ref 11.5–15.5)
WBC: 12 10*3/uL — ABNORMAL HIGH (ref 4.0–10.5)
nRBC: 0.3 % — ABNORMAL HIGH (ref 0.0–0.2)

## 2023-06-25 MED ORDER — SODIUM CHLORIDE 0.9 % IV SOLN
2.0000 g | INTRAVENOUS | Status: DC
  Administered 2023-06-25 – 2023-06-27 (×3): 2 g via INTRAVENOUS
  Filled 2023-06-25 (×3): qty 20

## 2023-06-25 MED ORDER — PHENYLEPHRINE HCL-NACL 20-0.9 MG/250ML-% IV SOLN
INTRAVENOUS | Status: AC
  Filled 2023-06-25: qty 500

## 2023-06-25 MED ORDER — OXYCODONE HCL 5 MG PO TABS
2.5000 mg | ORAL_TABLET | Freq: Four times a day (QID) | ORAL | Status: AC | PRN
Start: 2023-06-25 — End: 2023-06-26
  Administered 2023-06-25 – 2023-06-26 (×2): 5 mg via ORAL
  Filled 2023-06-25 (×2): qty 1

## 2023-06-25 MED ORDER — VANCOMYCIN HCL 750 MG/150ML IV SOLN
750.0000 mg | INTRAVENOUS | Status: DC
  Administered 2023-06-26: 750 mg via INTRAVENOUS
  Filled 2023-06-25 (×3): qty 150

## 2023-06-25 MED ORDER — VANCOMYCIN HCL 1500 MG/300ML IV SOLN
1500.0000 mg | Freq: Once | INTRAVENOUS | Status: AC
  Administered 2023-06-25: 1500 mg via INTRAVENOUS
  Filled 2023-06-25: qty 300

## 2023-06-25 NOTE — TOC Progression Note (Signed)
 Transition of Care [facility]) - Progression Note    Patient Details  Name: [name] MRN: [id number] Date of Birth: 1945/12/13  Transition of [facility] CM/SW Contact  Michaela Corner, [place] Phone Number: 06/25/2023, 9:31 AM  Clinical Narrative:   CSW spoke with patients daughter regarding transportation to HD. [name] stated that she completed an [employer] [place] application with the [facility] psychotherapist. CSW notified MD and RNCM.   TOC will continue to follow.      Expected Discharge Plan: Home w Home Health Services Barriers to Discharge: Continued Medical Work up  Expected Discharge Plan and Services In-house Referral: NA Discharge Planning Services: CM Consult Post Acute Care Choice: Home Health, Resumption of Svcs/PTA Provider Living arrangements for the past 2 months: Single Family Home Expected Discharge Date: 06/24/23               DME Arranged: N/A DME Agency: NA       HH Arranged: RN, PT, OT, Nurse's Aide, Social Work [employer] Agency: [facility] Date HH Agency Contacted: 06/17/23 Time HH Agency Contacted: 1238 Representative spoke with at [facility] Agency: Clifton Custard   Social Determinants of Health (SDOH) Interventions SDOH Screenings   Food Insecurity: No Food Insecurity (05/25/2023)  Housing: Low Risk  (05/25/2023)  Transportation Needs: No Transportation Needs (05/25/2023)  Utilities: Not At Risk (05/25/2023)  Alcohol Screen: Low Risk  (10/25/2021)  Depression (PHQ2-9): Low Risk  (09/12/2020)  Financial Resource Strain: Low Risk  (10/25/2021)  Social Connections: Patient Declined (06/19/2023)  Recent Concern: Social Connections - Socially Isolated (05/25/2023)  Tobacco Use: Low Risk  (06/21/2023)    Readmission Risk Interventions    05/13/2023    4:50 PM 12/27/2021    2:10 PM 11/08/2021   12:56 PM  Readmission Risk Prevention Plan  Transportation Screening Complete Complete Complete  Medication Review (RN Care Manager) Referral to Pharmacy Complete Complete  PCP or  Specialist appointment within 3-5 days of discharge Complete Complete Complete  HRI or Home Care Consult Complete Complete   SW Recovery Care/Counseling Consult Complete Complete Complete  Palliative Care Screening Not Applicable  Not Applicable  Skilled Nursing Facility Not Applicable Not Applicable Not Applicable

## 2023-06-25 NOTE — Progress Notes (Addendum)
 [facility] [place]  Subjective  - pian and new erythema at the left AC incision site. RN called to report Tmax 100.7 and increased WBC 12.0   Objective 139/63 81 98.9 F (37.2 C) (Oral) 19 95%  Intake/Output Summary (Last 24 hours) at 06/25/2023 1058 Last data filed at 06/25/2023 0803 Gross per 24 hour  Intake 160 ml  Output 1000 ml  Net -840 ml    Palpable radial pulse Grip 4+/5 left UE, numbness in the finger tips   Assessment/Planning: ESRD S/P left UE AV graft placement 06/21/23  Mild steal symptoms with intact M/V, numbness in finger tips.   She does not have new complaints the RN called about the erythema and mildly elevated temp.   New erythema at the [facility] incision, no fluctuance. WBC 12.0 With Tmax of 100.7 Blood cultures have been drawn This could be a graft reaction verses infection will monitor   [name] 06/25/2023 10:58 AM --  Laboratory Lab Results: Recent Labs    06/23/23 0227 06/25/23 0024  WBC 10.8* 12.0*  HGB 8.1* 8.2*  HCT 26.2* 26.4*  PLT 204 184   BMET Recent Labs    06/23/23 0227 06/24/23 0255  NA 136 135  K 3.7 4.2  CL 97* 98  CO2 25 23  GLUCOSE 98 93  BUN 63* 83*  CREATININE 3.88* 4.71*  CALCIUM 8.2* 8.3*    COAG Lab Results  Component Value Date   INR 1.5 (H) 07/28/2022   INR 1.3 (H) 02/03/2022   INR 1.3 (H) 11/05/2021   No results found for: "PTT"  I have independently interviewed and examined patient and agree with PA assessment and plan above.  Her incisions actually look okay to me although the erythema from earlier pictures is appreciated.  I will leave the graft for now given that she is not having ongoing fevers it would be early for graft infection.  She does still have some numbness in the fingertips and hopefully this will resolve with time.  I discussed with her that she is at high risk for graft excision prior to using the graft and will continue to use the tunneled dialysis  catheter in the interim.  Kaz Auld C. Randie Heinz, MD [facility] [place] Office: [phone] Pager: [phone]

## 2023-06-25 NOTE — Progress Notes (Signed)
 Patient had episode of fever as high as 100.7, she's not aware that she's in the hospital, no signs of discomfort observed. Noted with slight tenderness and swelling on left upper arm around the AV Graft incision site. Notified Dy Opyd ; with orders placed. Facilitated blood culture 2 sites and CBC. Tylenol given. Safety measures in place, kept call bell within reached and bed in lowest level.

## 2023-06-25 NOTE — Progress Notes (Signed)
 Pharmacy Antibiotic Note  [name] is a 78 y.o. female admitted on 06/15/2023 with AV fistula cellulitis.  Pharmacy has been consulted for vancomycin dosing.  WBC 12, afebrile. Concern for cellulitis around site of new AVF fistula - tender to palpitation and local erythema.   Plan: Start ceftriaxone 2g IV every 24 hours Start vancomycin 1500 mg IV once then plan for 750 mg with iHD Monitor HD schedule, cx results, clinical pic, and vanc levels as appropriate  Height: (P) 5\' 4"  (162.6 cm) Weight: 74.4 kg (164 lb 0.4 oz) IBW/kg (Calculated) : (P) 54.7  Temp (24hrs), Avg:99.5 F (37.5 C), Min:98.8 F (37.1 C), Max:100.7 F (38.2 C)  Recent Labs  Lab 06/19/23 0237 06/20/23 0254 06/21/23 0304 06/22/23 0232 06/23/23 0227 06/24/23 0255 06/25/23 0024  WBC 10.9* 13.5* 14.4*  --  10.8*  --  12.0*  CREATININE 4.83* 4.85* 4.85* 3.69* 3.88* 4.71*  --     Estimated Creatinine Clearance: 10.2 mL/min (A) (by C-G formula based on SCr of 4.71 mg/dL (H)).    Allergies  Allergen Reactions   Anesthetics, Halogenated Other (See Comments)    Per patient's daughter, she can not tolerate anesthetics.    Etomidate Other (See Comments)    Heart stopped during anesthesia   Fentanyl Other (See Comments)    Heart stopped during anesthesia    Percocet [Oxycodone-Acetaminophen] Other (See Comments)    Headaches    Diazepam Other (See Comments)    Agitation   Lorazepam Nausea Only   Tramadol Hcl Nausea And Vomiting   Haloperidol Nausea And Vomiting    **Haldol**   Morphine Sulfate Nausea Only    agitation   Propoxyphene Nausea And Vomiting    **Darvocet** no longer available   Versed [Midazolam] Other (See Comments)    Heart stopped during anesthesia    Antimicrobials this admission: Ceftriaxone 4/8 >>  Vanc 4/8 >>   Dose adjustments this admission: N/A  Microbiology results: 4/8 BCx: ngtd   Thank you for allowing pharmacy to participate in this patient's  care,  Sherron Monday, PharmD, BCCCP Clinical Pharmacist  Phone: [phone] 06/25/2023 1:37 PM  Please check AMION for all [facility] Pharmacy phone numbers After 10:00 PM, call Main Pharmacy [phone]

## 2023-06-25 NOTE — Care Management Important Message (Signed)
 Important Message  Patient Details  Name: [name] MRN: [id number] Date of Birth: 02/25/1946   Important Message Given:  Yes - Medicare IM     Renie Ora 06/25/2023, 9:53 AM

## 2023-06-25 NOTE — Progress Notes (Signed)
 [facility] NEPHROLOGY PROGRESS NOTE  Assessment/ Plan: Pt is a 78 y.o. yo female  with history of hypertension, renal vein thrombosis on Eliquis, stroke, type II DM, lupus, CKD 4-5 who was recently admitted for CHF exacerbation and progressive CKD presented back with worsening dyspnea on exertion and fluid overload.   # Progressive CKD 5 versus AKI on CKD,: With frequent hospitalization for fluid overload.  Responding to IV diuretics but not po and with worsening renal function d/w patient and family (Daughter on phone 4/2) at length recommend starting dialysis. S/p LUE AVG + TDC placement 4/4 with HD 1, HD 2 4/5,  - HD 06/24/23, next 4/9 - CLIP complete   # CKD-MBD/hyperphosphatemia: Started sevelamer in the recent visit, monitor lab.  PTH 220.   #Anemia of CKD: Concern about GI bleed, underwent endoscopy t recently with no sign of bleeding.  Back on Eliquis.  Hb was 6.4 and now 8s. after transfusion.  Recent iron saturation 35% but down to 15% so redosed.  S/p aranesp 100 3/30.     # Hypertension/volume: [name] improved after diuresis.  Torsemide on non-dialysis days  #A fib: on eliquis - per VVS ok to resume  # fever:  - cultures drawn, follow -   We will continue to follow.   Subjective: Seen in room.  Was going to be discharged yesterday but had a fever.  Blood cultures drawn.  Standing at sink this AM- appears well.    Objective Vital signs in last 24 hours: Vitals:   06/25/23 0125 06/25/23 0422 06/25/23 0500 06/25/23 0802  BP: (!) 143/46 (!) 141/42  139/63  Pulse: 81 76  81  Resp: 19 18  19   Temp: 99.6 F (37.6 C) 99 F (37.2 C)  98.9 F (37.2 C)  TempSrc: Oral Oral  Oral  SpO2: 95% 99%  95%  Weight:   74.4 kg   Height:       Weight change: -3.517 kg  Intake/Output Summary (Last 24 hours) at 06/25/2023 1100 Last data filed at 06/25/2023 0803 Gross per 24 hour  Intake 160 ml  Output 1000 ml  Net -840 ml       Labs: RENAL PANEL Recent Labs  Lab  06/20/23 0254 06/21/23 0304 06/22/23 0232 06/23/23 0227 06/24/23 0255  NA 136 137 138 136 135  K 4.1 3.9 3.6 3.7 4.2  CL 98 98 99 97* 98  CO2 22 23 24 25 23   GLUCOSE 147* 154* 202* 98 93  BUN 103* 112* 76* 63* 83*  CREATININE 4.85* 4.85* 3.69* 3.88* 4.71*  CALCIUM 8.5* 8.3* 8.4* 8.2* 8.3*  PHOS  --   --   --   --  3.9  ALBUMIN  --   --   --   --  2.1*    Liver Function Tests: Recent Labs  Lab 06/24/23 0255  ALBUMIN 2.1*   No results for input(s): "LIPASE", "AMYLASE" in the last 168 hours. No results for input(s): "AMMONIA" in the last 168 hours. CBC: Recent Labs    01/31/23 1437 02/13/23 1332 02/28/23 1411 03/14/23 1450 04/04/23 1445 04/04/23 1446 04/18/23 1433 04/18/23 1434 05/08/23 1833 05/13/23 0358 05/13/23 0535 05/14/23 0802 05/28/23 0459 05/29/23 0735 06/17/23 0330 06/18/23 0334 06/19/23 0237 06/20/23 0254 06/21/23 0304 06/23/23 0227 06/25/23 0024  HGB 10.2*   < > 9.9*   < > 9.9*  --   --  9.4*   < >  --   --    < > 7.9*   < >  8.7*   < > 7.8* 8.7* 8.3* 8.1* 8.2*  MCV 87.7   < > 85.2   < > 85.9  --   --  86.4   < >  --   --    < > 87.1   < > 89.4   < > 90.4 88.8 88.9 91.0 92.0  VITAMINB12 1,098*  --  833  --  700  --   --  835  --  1,040*  --   --   --   --   --   --   --   --   --   --   --   FOLATE  --   --   --   --   --   --   --   --   --   --  5.3*  --   --   --   --   --   --   --   --   --   --   FERRITIN  --   --   --   --   --  153 226  --   --  220  --   --  246  --  192  --   --   --   --   --   --   TIBC  --   --   --   --   --   --   --   --   --  206*  --   --  207*  --  267  --   --   --   --   --   --   IRON  --   --   --   --   --   --   --   --   --  46  --   --  73  --  40  --   --   --   --   --   --   RETICCTPCT  --   --   --   --   --   --   --   --   --  1.5  --   --   --   --   --   --   --   --   --   --   --    < > = values in this interval not displayed.    Cardiac Enzymes: No results for input(s): "CKTOTAL", "CKMB",  "CKMBINDEX", "TROPONINI" in the last 168 hours. CBG: Recent Labs  Lab 06/23/23 0626 06/23/23 2224 06/24/23 1642 06/24/23 2113 06/25/23 0617  GLUCAP 79 155* 265* 161* 108*    Iron Studies:  No results for input(s): "IRON", "TIBC", "TRANSFERRIN", "FERRITIN" in the last 72 hours.  Studies/Results: No results found.    Medications: Infusions:    Scheduled Medications:  apixaban  2.5 mg Oral BID   Chlorhexidine Gluconate Cloth  6 each Topical Q0600   colestipol  1 g Oral TID   FLUoxetine  20 mg Oral Daily   folic acid  1 mg Oral Daily   gabapentin  300 mg Oral QHS   insulin aspart  0-6 Units Subcutaneous TID WC   lipase/protease/amylase  36,000 Units Oral TID AC   mirtazapine  7.5 mg Oral QHS   multivitamin  1 tablet Oral QHS   pantoprazole  40 mg Oral BID   polyethylene glycol  17 g Oral  Daily   rivastigmine  1.5 mg Oral BID   sevelamer carbonate  800 mg Oral TID WC   torsemide  100 mg Oral Q T,Th,S,Su    have reviewed scheduled and prn medications.  Physical Exam: General:NAD, comfortable Heart:RRR, s1s2 nl Lungs:clear b/l, no crackle Abdomen:soft, Non-tender, non-distended Extremities: minimal edema, LUE AVG +t/b, 2+ radial pulse, slightly tender over AVG Neurology: Alert awake and following commands RIJ TDC c/d/i  [name] 06/25/2023,11:00 AM  LOS: 9 days

## 2023-06-25 NOTE — Progress Notes (Signed)
 Mobility Specialist Progress Note:   06/25/23 0920  Mobility  Activity Ambulated with assistance in hallway  Level of Assistance Contact guard assist, steadying assist  Assistive Device Front wheel walker  Distance Ambulated (ft) 250 ft  Mobility Referral Yes  Mobility visit 1 Mobility  Mobility Specialist Start Time (ACUTE ONLY) 0920  Mobility Specialist Stop Time (ACUTE ONLY) 0930  Mobility Specialist Time Calculation (min) (ACUTE ONLY) 10 min   Pt agreeable to mobility session. Required only minG assist during ambulation with RW. No unsteadiness noted. Pt left in chair with all needs met.  Addison Lank Mobility Specialist Please contact via SecureChat or  Rehab office at [phone]

## 2023-06-25 NOTE — Progress Notes (Addendum)
 Progress Note   Patient: [name] ZOX:[id number] DOB: 03/15/1946 DOA: 06/15/2023     9 DOS: the patient was seen and examined on 06/25/2023   Brief hospital course: [name] was admitted to the hospital with the working diagnosis of heart failure exacerbation in the setting of progressive renal disease.   78 yo female with the past medical history of diastolic heart failure, CKD 5, history of DVT, renal vein thrombosis, depression, type 2 diabetes mellitus, history of breast cancer, and systemic erythematous lupus who presented with dyspnea.  Recent hospitalization for volume overload due to heart failure 03/08 to 03/15, she was diuresed and discharged home.  Unfortunately at home she had progressive dyspnea on exertion, bilateral lower extremity edema and rapid weight gain (20 lbs since her discharge from the hospital).  On her initial physical examination her blood pressure was 160/70, HR 94 RR 17 and 02 saturation 97%. Lungs with no wheezing or rhonchi, heart with S1 and S2 present and regular with no gallops, rubs or murmurs, respiratory with no wheezing or rhonchi, abdomen with no distention and positive pitting bilateral lower extremity edema   Na 140, K 5.0 CL 109, bicarbonate 20, glucose 147 bun 82 cr 4,0  BNP 560  High sensitive troponin 125 and 117  Wbc 11,4 hgb 7,0 plt 192   Chest radiograph with hypoinflation, with mild cardiomegaly, bilateral hilar vascular congestion with cephalization of the vasculature and small bilateral pleural effusions.   EKG 88 bpm, normal axis, right bundle branch block, sinus rhythm with no significant ST segment or T wave changes.   Patient was placed on IV furosemide for diuresis, and then transitioned to oral loop diuretics.  Her renal function continue to worsened and decision was made to start patient on renal replacement therapy.   04/04 patient had right internal jugular tunneled HD catheter placed and left upper extremity fistula  placed.  04/05 HD with good toleration.  04/06 improved volume status.   04/07 discharge has held due to low diastolic pressure. She developed signs of cellulitis on her left upper extremity fistula site.  04/08 started on antibiotic therapy.   Assessment and Plan: * Acute on chronic diastolic CHF (congestive heart failure) (HCC) Echocardiogram with preserved LV systolic function with EF 60 to 65%, moderate LVH, RV systolic function preserved, mild mitral valve regurgitation, mid tricuspid valve regurgitation, RVSP 33,9 mmHg, trivial pericardial effusion,    Volume status has improved.  Continue ultrafiltration for volume management.   Limited medical therapy due to risk of hypotension.   Chronic kidney disease (CKD), stage V (HCC) Progressed to ESRD no on renal replacement therapy per HD  Has a left upper extremity fistula in place.   Check renal function in am.   Anemia of chronic renal disease.  SP EPO and IV iron  Hgb  8.1   Metabolic bone disease, continue with sevelamer.   History of renal vein thrombosis in 2019 and deep vein thrombosis 2022  Anticoagulation with apixaban.   Patient has arrangements in place for outpatient hemodialysis, continue torsemide 100 mg on on HD days.  Possible mild steal symptoms from fistula.   Left upper extremity with clinical signs of acute cellulitis (in the setting of new AVF fistula creation), with increased local temperature, tender to palpation and local erythema. T amx 100.7 yesterday.  Will start patient on antibiotic therapy with IV vancomycin and ceftriaxone.  Follow up on cultures, cell count and temperature curve.    Essential hypertension Mild lower  diastolic blood pressure, continue to hold on amlodipine.   Continue fluid management per HD   Acute on chronic anemia Acute on chronic anemia of chronic renal disease.  03./30 PRBC transfusion x 1 Stable hgb  No signs of active bleeding   GERD (gastroesophageal reflux  disease) Continue antiacid therapy with pantoprazole.   Type 2 diabetes mellitus (HCC) Patient is on insulin sliding scale for glucose cover and monitoring. Fasting capillary glucose today 124 mg/dl   Gout flare Patient was treated with 3 days of prednisone with good toleration   CAD (coronary artery disease) SP PCI in 2000 No chest pain, no acute coronary syndrome   Depression Continue with rivastigmine, mirtazapine and fluoxetine   Breast cancer of lower-inner quadrant of right female breast (HCC) Follow up as outpatient.    Subjective: Patient with positive pain and increased local temperature at fistula site, no nausea or vomiting, no dyspnea or edema, no dizziness or lightheadedness.   Physical Exam: Vitals:   06/25/23 0422 06/25/23 0500 06/25/23 0802 06/25/23 1130  BP: (!) 141/42  139/63 (!) 132/52  Pulse: 76  81 80  Resp: 18  19 18   Temp: 99 F (37.2 C)  98.9 F (37.2 C) 98.8 F (37.1 C)  TempSrc: Oral  Oral Oral  SpO2: 99%  95% 99%  Weight:  74.4 kg    Height:       Neurology awake and alert ENT with mild pallor with no icterus Cardiovascular with S1 and S2 present and regular with no gallops, rubs or murmurs Respiratory with no rales or wheezing Abdomen with no distention  No lower extremity edema  Left upper extremity with tender to palpation around her fistula site with increased local temperature and local erythema.   Data Reviewed:    Family Communication: no family at the bedside.  I spoke with patient's daughter over phone, we talked in detail about patient's condition, plan of care and prognosis and all questions were addressed.    Disposition: Status is: Inpatient Remains inpatient appropriate because: IV antibiotics   Planned Discharge Destination: Home     Author: Coralie Keens, MD 06/25/2023 1:11 PM  For on call review [email or web address].

## 2023-06-25 NOTE — Progress Notes (Incomplete)
 PROGRESS NOTE    [name]  ZOX:[id number] DOB: 1945-08-26 DOA: 06/15/2023 PCP: Renford Dills, MD  77/F with CAD, chronic diastolic CHF, CKD 4, history of DVT, renal vein thrombosis on Eliquis, CVA, depression, memory loss, type 2 diabetes mellitus, history of breast cancer, lupus was admitted 3/8-15 with volume overload, progressive CKD, followed by nephrology, diuresed and discharged home on oral diuretics, also seen by gastroenterology for worsening chronic anemia, EGD and colonoscopy were unremarkable, Eliquis resumed at discharge. -Back in the ED 3/29 with progressive dyspnea on exertion orthopnea lower extremity edema and weight gain. -In the ED hypertensive, otherwise vital signs stable, hemoglobin 7.0, creatinine 4.0, BUN 82, BNP 560, troponin 125, chest x-ray unremarkable -Admitted, started on IV diuretics, nephrology following -4/2, vascular surgery consult, plan to start HD -4/4: tunneled HD cath placed, left arm AV graft -4/7: low BP, DC held, ? Infection at AVG site -4/8: started Abx  Subjective: Feels poorly, pain in her left AC, mild chest pain earlier  Assessment and Plan:  Acute on chronic HFpEF Progressive CKD stage 4/5 -Recent hospitalization for same  -Echo 3/25 with EF 60-65, grade 1 DD, normal RV  -Limited response to diuretics, in the background of progressive CKD -started HD 4/4 -Nephrology following, HD today  Left arm cellulitis, AVG site -Low-grade fevers 4/7, afebrile since starting antibiotics -Vascular surgery following, observe for now versus graft excision -Follow-up blood cultures   Anemia of chronic disease, iron deficiency -Underwent GI workup few weeks ago which was unremarkable -Baseline hemoglobin 8-10, lower this admission, transfused 1 unit PRBC 3/30 -Given IV iron and Aranesp  Left foot, gout flare -Uric acid is 12.3, improving, completed 3 days of prednisone   CAD status post PCI in 2000 EKG without acute ischemic changes.   Stable   Hypertension -hold amlodipine.   History of renal vein thrombosis 09/2017 and DVT 06/2020 -Continue Eliquis   Depression Mild Dementia -Continue fluoxetine, rivastigmine   Type 2 diabetes A1c 6.2 on 05/08/2023.  Sensitive sliding scale   Chronic pain/neuropathy Continue gabapentin.   GERD/gastritis Continue Protonix.   DVT prophylaxis: Eliquis Code Status: Full code Family Communication: None present Disposition Plan: Home pending improvement in left arm  Consultants:    Procedures:   Antimicrobials:    Objective: Vitals:   06/25/23 0500 06/25/23 0802 06/25/23 1130 06/25/23 1614  BP:  139/63 (!) 132/52 (!) 108/36  Pulse:  81 80 80  Resp:  19 18 20   Temp:  98.9 F (37.2 C) 98.8 F (37.1 C) 99 F (37.2 C)  TempSrc:  Oral Oral Oral  SpO2:  95% 99% 100%  Weight: 74.4 kg     Height:        Intake/Output Summary (Last 24 hours) at 06/25/2023 1754 Last data filed at 06/25/2023 1600 Gross per 24 hour  Intake 439.56 ml  Output --  Net 439.56 ml   Filed Weights   06/24/23 1202 06/24/23 1648 06/25/23 0500  Weight: 72.1 kg 72.4 kg 74.4 kg    Examination:  Gen: Awake, Alert, Oriented X 2  HEENT: no JVD, right IJ HD catheter Lungs: Good air movement bilaterally, CTAB CVS: S1S2/RRR Abd: soft, Non tender, non distended, BS present Extremities: Left arm AV graft with erythema and tenderness Skin: As above    Data Reviewed:   CBC: Recent Labs  Lab 06/19/23 0237 06/20/23 0254 06/21/23 0304 06/23/23 0227 06/25/23 0024  WBC 10.9* 13.5* 14.4* 10.8* 12.0*  HGB 7.8* 8.7* 8.3* 8.1* 8.2*  HCT 25.4* 27.7*  25.7* 26.2* 26.4*  MCV 90.4 88.8 88.9 91.0 92.0  PLT 169 196 221 204 184   Basic Metabolic Panel: Recent Labs  Lab 06/20/23 0254 06/21/23 0304 06/22/23 0232 06/23/23 0227 06/24/23 0255  NA 136 137 138 136 135  K 4.1 3.9 3.6 3.7 4.2  CL 98 98 99 97* 98  CO2 22 23 24 25 23   GLUCOSE 147* 154* 202* 98 93  BUN 103* 112* 76* 63* 83*   CREATININE 4.85* 4.85* 3.69* 3.88* 4.71*  CALCIUM 8.5* 8.3* 8.4* 8.2* 8.3*  PHOS  --   --   --   --  3.9   GFR: Estimated Creatinine Clearance: 10.2 mL/min (A) (by C-G formula based on SCr of 4.71 mg/dL (H)). Liver Function Tests: Recent Labs  Lab 06/24/23 0255  ALBUMIN 2.1*   No results for input(s): "LIPASE", "AMYLASE" in the last 168 hours. No results for input(s): "AMMONIA" in the last 168 hours. Coagulation Profile: No results for input(s): "INR", "PROTIME" in the last 168 hours. Cardiac Enzymes: No results for input(s): "CKTOTAL", "CKMB", "CKMBINDEX", "TROPONINI" in the last 168 hours. BNP (last 3 results) No results for input(s): "PROBNP" in the last 8760 hours. HbA1C: No results for input(s): "HGBA1C" in the last 72 hours. CBG: Recent Labs  Lab 06/24/23 1642 06/24/23 2113 06/25/23 0617 06/25/23 1128 06/25/23 1611  GLUCAP 265* 161* 108* 124* 290*   Lipid Profile: No results for input(s): "CHOL", "HDL", "LDLCALC", "TRIG", "CHOLHDL", "LDLDIRECT" in the last 72 hours. Thyroid Function Tests: No results for input(s): "TSH", "T4TOTAL", "FREET4", "T3FREE", "THYROIDAB" in the last 72 hours. Anemia Panel: No results for input(s): "VITAMINB12", "FOLATE", "FERRITIN", "TIBC", "IRON", "RETICCTPCT" in the last 72 hours.  Urine analysis:    Component Value Date/Time   COLORURINE STRAW (A) 05/26/2023 1610   APPEARANCEUR CLEAR 05/26/2023 1610   APPEARANCEUR Clear 08/22/2018 1620   LABSPEC 1.008 05/26/2023 1610   LABSPEC 1.020 03/03/2014 1341   PHURINE 5.0 05/26/2023 1610   GLUCOSEU NEGATIVE 05/26/2023 1610   GLUCOSEU Negative 03/03/2014 1341   HGBUR SMALL (A) 05/26/2023 1610   BILIRUBINUR NEGATIVE 05/26/2023 1610   BILIRUBINUR Negative 08/22/2018 1620   BILIRUBINUR Negative 03/03/2014 1341   KETONESUR NEGATIVE 05/26/2023 1610   PROTEINUR 100 (A) 05/26/2023 1610   UROBILINOGEN 0.2 03/03/2014 1341   NITRITE NEGATIVE 05/26/2023 1610   LEUKOCYTESUR NEGATIVE 05/26/2023  1610   LEUKOCYTESUR Negative 03/03/2014 1341   Sepsis Labs: @LABRCNTIP (procalcitonin:4,lacticidven:4)  ) Recent Results (from the past 240 hours)  Culture, blood (Routine X 2) w Reflex to ID Panel     Status: None (Preliminary result)   Collection Time: 06/25/23 12:24 AM   Specimen: BLOOD RIGHT HAND  Result Value Ref Range Status   Specimen Description BLOOD RIGHT HAND  Final   Special Requests   Final    BOTTLES DRAWN AEROBIC AND ANAEROBIC Blood Culture adequate volume   Culture   Final    NO GROWTH < 12 HOURS Performed at [facility], [address] [place], [place] [address]    Report Status PENDING  Incomplete  Culture, blood (Routine X 2) w Reflex to ID Panel     Status: None (Preliminary result)   Collection Time: 06/25/23 12:24 AM   Specimen: BLOOD RIGHT ARM  Result Value Ref Range Status   Specimen Description BLOOD RIGHT ARM  Final   Special Requests   Final    BOTTLES DRAWN AEROBIC AND ANAEROBIC Blood Culture adequate volume   Culture   Final  NO GROWTH < 12 HOURS Performed at [facility], [address] [place], [place] [address]    Report Status PENDING  Incomplete     Radiology Studies: No results found.     Scheduled Meds:  apixaban  2.5 mg Oral BID   Chlorhexidine Gluconate Cloth  6 each Topical Q0600   colestipol  1 g Oral TID   FLUoxetine  20 mg Oral Daily   folic acid  1 mg Oral Daily   gabapentin  300 mg Oral QHS   insulin aspart  0-6 Units Subcutaneous TID WC   lipase/protease/amylase  36,000 Units Oral TID AC   mirtazapine  7.5 mg Oral QHS   multivitamin  1 tablet Oral QHS   pantoprazole  40 mg Oral BID   polyethylene glycol  17 g Oral Daily   rivastigmine  1.5 mg Oral BID   sevelamer carbonate  800 mg Oral TID WC   torsemide  100 mg Oral Q T,Th,S,Su   Continuous Infusions:  cefTRIAXone (ROCEPHIN)  IV 2 g (06/25/23 1354)   [START ON 06/26/2023] vancomycin        LOS: 9 days    Time spent:    Zannie Cove, MD [facility]   06/25/2023, 5:54 PM

## 2023-06-25 NOTE — Plan of Care (Signed)
  Problem: Tissue Perfusion: Goal: Adequacy of tissue perfusion will improve Outcome: Progressing   Problem: Clinical Measurements: Goal: Will remain free from infection Outcome: Progressing Goal: Respiratory complications will improve Outcome: Progressing Goal: Cardiovascular complication will be avoided Outcome: Progressing

## 2023-06-26 DIAGNOSIS — I5033 Acute on chronic diastolic (congestive) heart failure: Secondary | ICD-10-CM | POA: Diagnosis not present

## 2023-06-26 LAB — RENAL FUNCTION PANEL
Albumin: 2 g/dL — ABNORMAL LOW (ref 3.5–5.0)
Anion gap: 13 (ref 5–15)
BUN: 67 mg/dL — ABNORMAL HIGH (ref 8–23)
CO2: 23 mmol/L (ref 22–32)
Calcium: 8.4 mg/dL — ABNORMAL LOW (ref 8.9–10.3)
Chloride: 98 mmol/L (ref 98–111)
Creatinine, Ser: 4.76 mg/dL — ABNORMAL HIGH (ref 0.44–1.00)
GFR, Estimated: 9 mL/min — ABNORMAL LOW (ref >60)
Glucose, Bld: 139 mg/dL — ABNORMAL HIGH (ref 70–99)
Phosphorus: 3.1 mg/dL (ref 2.5–4.6)
Potassium: 3.7 mmol/L (ref 3.5–5.1)
Sodium: 134 mmol/L — ABNORMAL LOW (ref 135–145)

## 2023-06-26 LAB — CBC WITH DIFFERENTIAL/PLATELET
Abs Immature Granulocytes: 0.34 10*3/uL — ABNORMAL HIGH (ref 0.00–0.07)
Basophils Absolute: 0 10*3/uL (ref 0.0–0.1)
Basophils Relative: 0 %
Eosinophils Absolute: 0.1 10*3/uL (ref 0.0–0.5)
Eosinophils Relative: 1 %
HCT: 24.6 % — ABNORMAL LOW (ref 36.0–46.0)
Hemoglobin: 7.6 g/dL — ABNORMAL LOW (ref 12.0–15.0)
Immature Granulocytes: 3 %
Lymphocytes Relative: 13 %
Lymphs Abs: 1.5 10*3/uL (ref 0.7–4.0)
MCH: 28.9 pg (ref 26.0–34.0)
MCHC: 30.9 g/dL (ref 30.0–36.0)
MCV: 93.5 fL (ref 80.0–100.0)
Monocytes Absolute: 0.8 10*3/uL (ref 0.1–1.0)
Monocytes Relative: 7 %
Neutro Abs: 9.1 10*3/uL — ABNORMAL HIGH (ref 1.7–7.7)
Neutrophils Relative %: 76 %
Platelets: 186 10*3/uL (ref 150–400)
RBC: 2.63 MIL/uL — ABNORMAL LOW (ref 3.87–5.11)
RDW: 18.2 % — ABNORMAL HIGH (ref 11.5–15.5)
WBC: 11.9 10*3/uL — ABNORMAL HIGH (ref 4.0–10.5)
nRBC: 0.2 % (ref 0.0–0.2)

## 2023-06-26 LAB — GLUCOSE, CAPILLARY
Glucose-Capillary: 92 mg/dL (ref 70–99)
Glucose-Capillary: 121 mg/dL — ABNORMAL HIGH (ref 70–99)
Glucose-Capillary: 84 mg/dL (ref 70–99)
Glucose-Capillary: 97 mg/dL (ref 70–99)

## 2023-06-26 MED ORDER — HEPARIN SODIUM (PORCINE) 1000 UNIT/ML IJ SOLN
INTRAMUSCULAR | Status: AC
  Administered 2023-06-26: 1000 [IU]
  Filled 2023-06-26: qty 4

## 2023-06-26 NOTE — Progress Notes (Signed)
 [facility]  Subjective  - doing well this am eating breakfast.  She denies recurrent fever last 24 hours, cont numbness in left hand finger tips.    Objective (!) 129/37 80 98.3 F (36.8 C) (Oral) 20 96%  Intake/Output Summary (Last 24 hours) at 06/26/2023 0740 Last data filed at 06/26/2023 0441 Gross per 24 hour  Intake 516.56 ml  Output 402 ml  Net 114.56 ml   Palpable radial pulse and thrill in graft left UE Grip 4+/5 on the left UE Finger tips numbness without change No ischemic skin changes, hand warm Left AC incision erythema with mild edema appears to be higher along the graft track than compared to yesterdays exam.    Assessment/Planning: ESRD S/P left UE AV graft placement 06/21/23   Mild steal symptoms without pain distally, numbness in finger tips without ischemic changes Left AC incisional erythema with mild edema.  Erythema distal 1/3 of graft track.  Graft reaction verse infection.   Incision appears to be healing well. Leukocytosis decreased last 24 hours, afebrile. Will continue to observe the left UE for improvement verse the need for excision of graft the graft.    Mosetta Pigeon 06/26/2023 7:40 AM --  Laboratory Lab Results: Recent Labs    06/25/23 0024 06/26/23 0219  WBC 12.0* 11.9*  HGB 8.2* 7.6*  HCT 26.4* 24.6*  PLT 184 186   BMET Recent Labs    06/24/23 0255 06/26/23 0219  NA 135 134*  K 4.2 3.7  CL 98 98  CO2 23 23  GLUCOSE 93 139*  BUN 83* 67*  CREATININE 4.71* 4.76*  CALCIUM 8.3* 8.4*    COAG Lab Results  Component Value Date   INR 1.5 (H) 07/28/2022   INR 1.3 (H) 02/03/2022   INR 1.3 (H) 11/05/2021   No results found for: "PTT"

## 2023-06-26 NOTE — Progress Notes (Signed)
 Mobility Specialist Progress Note:   06/26/23 0955  Mobility  Activity Ambulated with assistance in hallway  Level of Assistance Contact guard assist, steadying assist  Assistive Device Front wheel walker  Distance Ambulated (ft) 150 ft  Activity Response Tolerated fair  Mobility Referral Yes  Mobility visit 1 Mobility  Mobility Specialist Start Time (ACUTE ONLY) 0955  Mobility Specialist Stop Time (ACUTE ONLY) 1010  Mobility Specialist Time Calculation (min) (ACUTE ONLY) 15 min   Pt agreeable to mobility session. Required only minG assist for safety with ambulation. Pt c/o L wrist and R foot pain. Given hot packs. Left in chair with all needs met.   Addison Lank Mobility Specialist Please contact via SecureChat or  Rehab office at [phone]

## 2023-06-26 NOTE — Progress Notes (Signed)
 [facility] Progress Note   Assessment/ Plan:   Pt is a 78 y.o. yo female  with history of hypertension, renal vein thrombosis on Eliquis, stroke, type II DM, lupus, CKD 4-5 who was recently admitted for CHF exacerbation and progressive CKD presented back with worsening dyspnea on exertion and fluid overload.    # Progressive CKD 5 versus AKI on CKD,: With frequent hospitalization for fluid overload.  Responding to IV diuretics but not po and with worsening renal function d/w patient and family (Daughter on phone 4/2) at length recommend starting dialysis. S/p LUE AVG + TDC placement 4/4 with HD 1, HD 2 4/5,  - HD 06/24/23, next 4/9 - CLIP complete   # CKD-MBD/hyperphosphatemia: Started sevelamer in the recent visit, monitor lab.  PTH 220.   #Anemia of CKD: Concern about GI bleed, underwent endoscopy t recently with no sign of bleeding.  Back on Eliquis.  Hb was 6.4 and now 8s. after transfusion.  Recent iron saturation 35% but down to 15% so redosed.  S/p aranesp 100 3/30.     # Hypertension/volume: [name] improved after diuresis.  Torsemide on non-dialysis days   #A fib: on eliquis - per VVS ok to resume   # fever:  - cultures drawn, follow - vanc with HD - VVS following to see if AVG needs to come out   We will continue to follow.   Subjective:    Seen in room.  On Vanc for possible graft infection.  VVS following.  For HD today   Objective:   BP (!) 169/59 (BP Location: Right Arm)   Pulse 88   Temp 98.3 F (36.8 C) (Oral)   Resp 19   Ht (P) 5\' 4"  (1.626 m)   Wt 73.2 kg   SpO2 100%   BMI (P) 27.70 kg/m   Intake/Output Summary (Last 24 hours) at 06/26/2023 1314 Last data filed at 06/26/2023 1112 Gross per 24 hour  Intake 696.56 ml  Output 402 ml  Net 294.56 ml   Weight change: 1.1 kg  Physical Exam: General:NAD, comfortable Heart:RRR, s1s2 nl Lungs:clear b/l, no crackle Abdomen:soft, Non-tender, non-distended Extremities: minimal edema, LUE AVG +t/b, 2+  radial pulse, slightly tender over AVG Neurology: Alert awake and following commands RIJ TDC c/d/I, L AVG + bruit, erythema and tenderness distally.  Still having numbness of the L hand  Imaging: No results found.  Labs: BMET Recent Labs  Lab 06/20/23 0254 06/21/23 0304 06/22/23 0232 06/23/23 0227 06/24/23 0255 06/26/23 0219  NA 136 137 138 136 135 134*  K 4.1 3.9 3.6 3.7 4.2 3.7  CL 98 98 99 97* 98 98  CO2 22 23 24 25 23 23   GLUCOSE 147* 154* 202* 98 93 139*  BUN 103* 112* 76* 63* 83* 67*  CREATININE 4.85* 4.85* 3.69* 3.88* 4.71* 4.76*  CALCIUM 8.5* 8.3* 8.4* 8.2* 8.3* 8.4*  PHOS  --   --   --   --  3.9 3.1   CBC Recent Labs  Lab 06/21/23 0304 06/23/23 0227 06/25/23 0024 06/26/23 0219  WBC 14.4* 10.8* 12.0* 11.9*  NEUTROABS  --   --   --  9.1*  HGB 8.3* 8.1* 8.2* 7.6*  HCT 25.7* 26.2* 26.4* 24.6*  MCV 88.9 91.0 92.0 93.5  PLT 221 204 184 186    Medications:     apixaban  2.5 mg Oral BID   Chlorhexidine Gluconate Cloth  6 each Topical Q0600   colestipol  1 g Oral TID  FLUoxetine  20 mg Oral Daily   folic acid  1 mg Oral Daily   gabapentin  300 mg Oral QHS   insulin aspart  0-6 Units Subcutaneous TID WC   lipase/protease/amylase  36,000 Units Oral TID AC   mirtazapine  7.5 mg Oral QHS   multivitamin  1 tablet Oral QHS   pantoprazole  40 mg Oral BID   polyethylene glycol  17 g Oral Daily   rivastigmine  1.5 mg Oral BID   sevelamer carbonate  800 mg Oral TID WC   torsemide  100 mg Oral Q T,Th,S,Su    Bufford Buttner MD 06/26/2023, 1:14 PM

## 2023-06-26 NOTE — Plan of Care (Signed)
   Problem: Activity: Goal: Risk for activity intolerance will decrease Outcome: Progressing   Problem: Pain Managment: Goal: General experience of comfort will improve and/or be controlled Outcome: Progressing   Problem: Safety: Goal: Ability to remain free from injury will improve Outcome: Progressing

## 2023-06-27 DIAGNOSIS — I5033 Acute on chronic diastolic (congestive) heart failure: Secondary | ICD-10-CM | POA: Diagnosis not present

## 2023-06-27 LAB — CBC
HCT: 27.2 % — ABNORMAL LOW (ref 36.0–46.0)
Hemoglobin: 8.3 g/dL — ABNORMAL LOW (ref 12.0–15.0)
MCH: 28.2 pg (ref 26.0–34.0)
MCHC: 30.5 g/dL (ref 30.0–36.0)
MCV: 92.5 fL (ref 80.0–100.0)
Platelets: 207 10*3/uL (ref 150–400)
RBC: 2.94 MIL/uL — ABNORMAL LOW (ref 3.87–5.11)
RDW: 18 % — ABNORMAL HIGH (ref 11.5–15.5)
WBC: 9 10*3/uL (ref 4.0–10.5)
nRBC: 0 % (ref 0.0–0.2)

## 2023-06-27 LAB — GLUCOSE, CAPILLARY
Glucose-Capillary: 79 mg/dL (ref 70–99)
Glucose-Capillary: 125 mg/dL — ABNORMAL HIGH (ref 70–99)
Glucose-Capillary: 171 mg/dL — ABNORMAL HIGH (ref 70–99)
Glucose-Capillary: 234 mg/dL — ABNORMAL HIGH (ref 70–99)

## 2023-06-27 MED ORDER — HYDROMORPHONE HCL 1 MG/ML IJ SOLN
0.5000 mg | INTRAMUSCULAR | Status: DC | PRN
  Administered 2023-06-27 – 2023-06-30 (×7): 0.5 mg via INTRAVENOUS
  Administered 2023-07-01: .25 mg via INTRAVENOUS
  Administered 2023-07-01: 0.5 mg via INTRAVENOUS
  Administered 2023-07-01: .25 mg via INTRAVENOUS
  Administered 2023-07-01 – 2023-07-03 (×4): 0.5 mg via INTRAVENOUS
  Filled 2023-06-27 (×12): qty 0.5

## 2023-06-27 MED ORDER — PHENYLEPHRINE HCL-NACL 20-0.9 MG/250ML-% IV SOLN
INTRAVENOUS | Status: AC
  Filled 2023-06-27: qty 500

## 2023-06-27 NOTE — Progress Notes (Signed)
 Mobility Specialist Progress Note:   06/27/23 0900  Mobility  Activity Ambulated with assistance in hallway  Level of Assistance Standby assist, set-up cues, supervision of patient - no hands on  Assistive Device Front wheel walker  Distance Ambulated (ft) 200 ft  Activity Response Tolerated fair  Mobility Referral Yes  Mobility visit 1 Mobility  Mobility Specialist Start Time (ACUTE ONLY) 0900  Mobility Specialist Stop Time (ACUTE ONLY) 0910  Mobility Specialist Time Calculation (min) (ACUTE ONLY) 10 min   Pt agreeable to mobility session. Required only supervision for safety with RW. Pt continues to c/o L arm and R foot pain. No unsteadiness noted. Pt left in BR with all needs met, NT aware.   Addison Lank Mobility Specialist Please contact via SecureChat or  Rehab office at [phone]

## 2023-06-27 NOTE — Plan of Care (Signed)
  Problem: Metabolic: Goal: Ability to maintain appropriate glucose levels will improve Outcome: Progressing   Problem: Elimination: Goal: Will not experience complications related to urinary retention Outcome: Progressing

## 2023-06-27 NOTE — Plan of Care (Signed)
  Problem: Education: Goal: Ability to describe self-care measures that may prevent or decrease complications (Diabetes Survival Skills Education) will improve Outcome: Progressing   Problem: Skin Integrity: Goal: Risk for impaired skin integrity will decrease Outcome: Progressing   Problem: Education: Goal: Knowledge of General Education information will improve Description: Including pain rating scale, medication(s)/side effects and non-pharmacologic comfort measures Outcome: Progressing

## 2023-06-27 NOTE — Progress Notes (Addendum)
 [facility]   Subjective  - No change with AC incision pain and finger tip numbness.   Objective (!) 157/47 88 99.2 F (37.3 C) (Oral) 18 (!) 86%  Intake/Output Summary (Last 24 hours) at 06/27/2023 0857 Last data filed at 06/26/2023 2148 Gross per 24 hour  Intake 240 ml  Output 1000 ml  Net -760 ml   Palpable radial pulse left UE Grip 4+ /5 compared to right UE due to incisional pain with grip testing No ischemic changes to the left hand, decreased sensation in the finger tips is unchanged Non tender surrounding the tunnel area    Incision appears to be healing well  Assessment/Planning: ESRD S/P left UE AV graft placement 06/21/23   Continued mild steal symptoms of finger tip numbness with intact.  She has a a palpable radial pulse and intact motor.    I spoke with Dr. Jomarie Longs this am and we noted she had been placed on IV antibiotic when she spike a low grade temp as well as blood cultures.   No growth on cultures and she has remained Afebrile with no leukocytosis.    It is early for an infectious process to be revealed so soon after surgery.  This may be graft reaction.    Mosetta Pigeon 06/27/2023 8:57 AM --  Laboratory Lab Results: Recent Labs    06/26/23 0219 06/27/23 0748  WBC 11.9* 9.0  HGB 7.6* 8.3*  HCT 24.6* 27.2*  PLT 186 207   BMET Recent Labs    06/26/23 0219  NA 134*  K 3.7  CL 98  CO2 23  GLUCOSE 139*  BUN 67*  CREATININE 4.76*  CALCIUM 8.4*    COAG Lab Results  Component Value Date   INR 1.5 (H) 07/28/2022   INR 1.3 (H) 02/03/2022   INR 1.3 (H) 11/05/2021   No results found for: "PTT"   I have independently interviewed and examined patient and agree with PA assessment and plan above. Graft appears to have reaction but not overtly infected. Fingers feeling better. Will leave alone for now but available as needed.   Rogers Ditter C. Randie Heinz, MD [facility] [place] Office:  [phone] Pager: [phone]

## 2023-06-27 NOTE — Progress Notes (Signed)
 [name] Progress Note   Assessment/ Plan:   Pt is a 78 y.o. yo female  with history of hypertension, renal vein thrombosis on Eliquis, stroke, type II DM, lupus, CKD 4-5 who was recently admitted for CHF exacerbation and progressive CKD presented back with worsening dyspnea on exertion and fluid overload.    # Progressive CKD 5 versus AKI on CKD,: With frequent hospitalization for fluid overload.  Responding to IV diuretics but not po and with worsening renal function d/w patient and family (Daughter on phone 4/2) at length recommend starting dialysis. S/p LUE AVG + TDC placement 4/4 with HD 1, HD 2 4/5,  - HD 06/24/23, 4/9 - CLIP complete - OK to d/c when infectious processes ruled out   # CKD-MBD/hyperphosphatemia: Started sevelamer in the recent visit, monitor lab.  PTH 220.   #Anemia of CKD: Concern about GI bleed, underwent endoscopy t recently with no sign of bleeding.  Back on Eliquis.  Hb was 6.4 and now 8s. after transfusion.  Recent iron saturation 35% but down to 15% so redosed.  S/p aranesp 100 3/30.     # Hypertension/volume: [name] improved after diuresis.  Torsemide on non-dialysis days   #A fib: on eliquis   # fever:  - cultures drawn, follow - vanc with HD and ceftriaxone too - VVS following to see if AVG needs to come out-- leaning towards AVG reaction    We will continue to follow.   Subjective:    Seen in room. Arm is no different than yesterday- looks like some bruising.  VVS following, appreciate assistance.  HD yesterday with 1L off.     Objective:   BP (!) 157/47 (BP Location: Right Arm)   Pulse 88   Temp 99.2 F (37.3 C) (Oral)   Resp 18   Ht (P) 5\' 4"  (1.626 m)   Wt 76.1 kg   SpO2 (!) 86%   BMI (P) 28.80 kg/m   Intake/Output Summary (Last 24 hours) at 06/27/2023 1032 Last data filed at 06/27/2023 0924 Gross per 24 hour  Intake 360 ml  Output 1300 ml  Net -940 ml   Weight change: 4.2 kg  Physical Exam: General:NAD,  comfortable Heart:RRR, s1s2 nl Lungs:clear b/l, no crackle Abdomen:soft, Non-tender, non-distended Extremities: minimal edema, LUE AVG +t/b, 2+ radial pulse, slightly tender over AVG Neurology: Alert awake and following commands RIJ TDC c/d/I, L AVG + bruit, erythema and tenderness distally.  Still having numbness of the L hand  Imaging: No results found.  Labs: BMET Recent Labs  Lab 06/21/23 0304 06/22/23 0232 06/23/23 0227 06/24/23 0255 06/26/23 0219  NA 137 138 136 135 134*  K 3.9 3.6 3.7 4.2 3.7  CL 98 99 97* 98 98  CO2 23 24 25 23 23   GLUCOSE 154* 202* 98 93 139*  BUN 112* 76* 63* 83* 67*  CREATININE 4.85* 3.69* 3.88* 4.71* 4.76*  CALCIUM 8.3* 8.4* 8.2* 8.3* 8.4*  PHOS  --   --   --  3.9 3.1   CBC Recent Labs  Lab 06/23/23 0227 06/25/23 0024 06/26/23 0219 06/27/23 0748  WBC 10.8* 12.0* 11.9* 9.0  NEUTROABS  --   --  9.1*  --   HGB 8.1* 8.2* 7.6* 8.3*  HCT 26.2* 26.4* 24.6* 27.2*  MCV 91.0 92.0 93.5 92.5  PLT 204 184 186 207    Medications:     apixaban  2.5 mg Oral BID   Chlorhexidine Gluconate Cloth  6 each Topical Q0600  colestipol  1 g Oral TID   FLUoxetine  20 mg Oral Daily   folic acid  1 mg Oral Daily   gabapentin  300 mg Oral QHS   insulin aspart  0-6 Units Subcutaneous TID WC   lipase/protease/amylase  36,000 Units Oral TID AC   mirtazapine  7.5 mg Oral QHS   multivitamin  1 tablet Oral QHS   pantoprazole  40 mg Oral BID   polyethylene glycol  17 g Oral Daily   rivastigmine  1.5 mg Oral BID   sevelamer carbonate  800 mg Oral TID WC   torsemide  100 mg Oral Q T,Th,S,Su    Bufford Buttner MD 06/27/2023, 10:32 AM

## 2023-06-27 NOTE — Progress Notes (Signed)
 PROGRESS NOTE    [name]  NFA:[id number] DOB: Jul 01, 1945 DOA: 06/15/2023 PCP: Renford Dills, MD  77/F with CAD, chronic diastolic CHF, CKD 4, history of DVT, renal vein thrombosis on Eliquis, CVA, depression, memory loss, type 2 diabetes mellitus, history of breast cancer, lupus was admitted 3/8-15 with volume overload, progressive CKD, followed by nephrology, diuresed and discharged home on oral diuretics, also seen by gastroenterology for worsening chronic anemia, EGD and colonoscopy were unremarkable, Eliquis resumed at discharge. -Back in the ED 3/29 with progressive dyspnea on exertion orthopnea lower extremity edema and weight gain. -In the ED hypertensive, otherwise vital signs stable, hemoglobin 7.0, creatinine 4.0, BUN 82, BNP 560, troponin 125, chest x-ray unremarkable -Admitted, started on IV diuretics, nephrology following -4/2, vascular surgery consult, plan to start HD -4/4: tunneled HD cath placed, left arm AV graft -4/7: low BP, DC held, ? Infection at AVG site -4/8: started Abx  Subjective: Feels so-so, complains of pain in her left AC  Assessment and Plan:  Acute on chronic HFpEF Progressive CKD stage 4/5 -Recent hospitalization for same  -Echo 3/25 with EF 60-65, grade 1 DD, normal RV  -Limited response to diuretics, in the background of progressive CKD -started HD 4/4 -Nephrology following, dialyzed yesterday  Left arm cellulitis, AVG site vs graft reaction -Low-grade fevers 4/7, afebrile since starting antibiotics -Vascular surgery following, WBC has improved on Vanco/ceftriaxone -Blood cultures negative thus far -Will change to oral Keflex tomorrow   Anemia of chronic disease, iron deficiency -Underwent GI workup few weeks ago which was unremarkable -Baseline hemoglobin 8-10, lower this admission, transfused 1 unit PRBC 3/30 -Given IV iron and Aranesp  Left foot, gout flare -Uric acid is 12.3, improving, completed 3 days of prednisone   CAD  status post PCI in 2000 EKG without acute ischemic changes.  Stable   Hypertension -hold amlodipine.   History of renal vein thrombosis 09/2017 and DVT 06/2020 -Continue Eliquis   Depression Mild Dementia -Continue fluoxetine, rivastigmine   Type 2 diabetes A1c 6.2 on 05/08/2023.  Sensitive sliding scale   Chronic pain/neuropathy Continue gabapentin.   GERD/gastritis Continue Protonix.   DVT prophylaxis: Eliquis Code Status: Full code Family Communication: Daughter at bedside Disposition Plan: Home pending improvement in left arm  Consultants:    Procedures:   Antimicrobials:    Objective: Vitals:   06/27/23 0000 06/27/23 0410 06/27/23 0730 06/27/23 0738  BP: (!) 143/48 (!) 153/56 (!) 154/52 (!) 157/47  Pulse: 79 81 86 88  Resp: 16 18 19 18   Temp: 98.6 F (37 C) 98.6 F (37 C) 98.8 F (37.1 C) 99.2 F (37.3 C)  TempSrc: Oral Oral Oral Oral  SpO2:  92% 96% (!) 86%  Weight:  76.1 kg    Height:        Intake/Output Summary (Last 24 hours) at 06/27/2023 1120 Last data filed at 06/27/2023 0924 Gross per 24 hour  Intake 240 ml  Output 1300 ml  Net -1060 ml   Filed Weights   06/26/23 0438 06/26/23 1427 06/27/23 0410  Weight: 73.2 kg 77.4 kg 76.1 kg    Examination: Gen: Awake, Alert, Oriented X 2 HEENT: no JVD Lungs: Good air movement bilaterally, CTAB CVS: S1S2/RRR Abd: soft, Non tender, non distended, BS present Extremities: Left arm AV graft with erythema and tenderness Skin: As above    Data Reviewed:   CBC: Recent Labs  Lab 06/21/23 0304 06/23/23 0227 06/25/23 0024 06/26/23 0219 06/27/23 0748  WBC 14.4* 10.8* 12.0* 11.9* 9.0  NEUTROABS  --   --   --  9.1*  --   HGB 8.3* 8.1* 8.2* 7.6* 8.3*  HCT 25.7* 26.2* 26.4* 24.6* 27.2*  MCV 88.9 91.0 92.0 93.5 92.5  PLT 221 204 184 186 207   Basic Metabolic Panel: Recent Labs  Lab 06/21/23 0304 06/22/23 0232 06/23/23 0227 06/24/23 0255 06/26/23 0219  NA 137 138 136 135 134*  K 3.9 3.6  3.7 4.2 3.7  CL 98 99 97* 98 98  CO2 23 24 25 23 23   GLUCOSE 154* 202* 98 93 139*  BUN 112* 76* 63* 83* 67*  CREATININE 4.85* 3.69* 3.88* 4.71* 4.76*  CALCIUM 8.3* 8.4* 8.2* 8.3* 8.4*  PHOS  --   --   --  3.9 3.1   GFR: Estimated Creatinine Clearance: 10.2 mL/min (A) (by C-G formula based on SCr of 4.76 mg/dL (H)). Liver Function Tests: Recent Labs  Lab 06/24/23 0255 06/26/23 0219  ALBUMIN 2.1* 2.0*   No results for input(s): "LIPASE", "AMYLASE" in the last 168 hours. No results for input(s): "AMMONIA" in the last 168 hours. Coagulation Profile: No results for input(s): "INR", "PROTIME" in the last 168 hours. Cardiac Enzymes: No results for input(s): "CKTOTAL", "CKMB", "CKMBINDEX", "TROPONINI" in the last 168 hours. BNP (last 3 results) No results for input(s): "PROBNP" in the last 8760 hours. HbA1C: No results for input(s): "HGBA1C" in the last 72 hours. CBG: Recent Labs  Lab 06/26/23 0639 06/26/23 1110 06/26/23 1849 06/26/23 1923 06/27/23 0541  GLUCAP 92 121* 84 97 79   Lipid Profile: No results for input(s): "CHOL", "HDL", "LDLCALC", "TRIG", "CHOLHDL", "LDLDIRECT" in the last 72 hours. Thyroid Function Tests: No results for input(s): "TSH", "T4TOTAL", "FREET4", "T3FREE", "THYROIDAB" in the last 72 hours. Anemia Panel: No results for input(s): "VITAMINB12", "FOLATE", "FERRITIN", "TIBC", "IRON", "RETICCTPCT" in the last 72 hours.  Urine analysis:    Component Value Date/Time   COLORURINE STRAW (A) 05/26/2023 1610   APPEARANCEUR CLEAR 05/26/2023 1610   APPEARANCEUR Clear 08/22/2018 1620   LABSPEC 1.008 05/26/2023 1610   LABSPEC 1.020 03/03/2014 1341   PHURINE 5.0 05/26/2023 1610   GLUCOSEU NEGATIVE 05/26/2023 1610   GLUCOSEU Negative 03/03/2014 1341   HGBUR SMALL (A) 05/26/2023 1610   BILIRUBINUR NEGATIVE 05/26/2023 1610   BILIRUBINUR Negative 08/22/2018 1620   BILIRUBINUR Negative 03/03/2014 1341   KETONESUR NEGATIVE 05/26/2023 1610   PROTEINUR 100 (A)  05/26/2023 1610   UROBILINOGEN 0.2 03/03/2014 1341   NITRITE NEGATIVE 05/26/2023 1610   LEUKOCYTESUR NEGATIVE 05/26/2023 1610   LEUKOCYTESUR Negative 03/03/2014 1341   Sepsis Labs: @LABRCNTIP (procalcitonin:4,lacticidven:4)  ) Recent Results (from the past 240 hours)  Culture, blood (Routine X 2) w Reflex to ID Panel     Status: None (Preliminary result)   Collection Time: 06/25/23 12:24 AM   Specimen: BLOOD RIGHT HAND  Result Value Ref Range Status   Specimen Description BLOOD RIGHT HAND  Final   Special Requests   Final    BOTTLES DRAWN AEROBIC AND ANAEROBIC Blood Culture adequate volume   Culture   Final    NO GROWTH 2 DAYS Performed at [facility], [address] [place], [place] [address]    Report Status PENDING  Incomplete  Culture, blood (Routine X 2) w Reflex to ID Panel     Status: None (Preliminary result)   Collection Time: 06/25/23 12:24 AM   Specimen: BLOOD RIGHT ARM  Result Value Ref Range Status   Specimen Description BLOOD RIGHT ARM  Final  Special Requests   Final    BOTTLES DRAWN AEROBIC AND ANAEROBIC Blood Culture adequate volume   Culture   Final    NO GROWTH 2 DAYS Performed at [facility], [address] [place], [place] [address]    Report Status PENDING  Incomplete     Radiology Studies: No results found.     Scheduled Meds:  apixaban  2.5 mg Oral BID   Chlorhexidine Gluconate Cloth  6 each Topical Q0600   colestipol  1 g Oral TID   FLUoxetine  20 mg Oral Daily   folic acid  1 mg Oral Daily   gabapentin  300 mg Oral QHS   insulin aspart  0-6 Units Subcutaneous TID WC   lipase/protease/amylase  36,000 Units Oral TID AC   mirtazapine  7.5 mg Oral QHS   multivitamin  1 tablet Oral QHS   pantoprazole  40 mg Oral BID   polyethylene glycol  17 g Oral Daily   rivastigmine  1.5 mg Oral BID   sevelamer carbonate  800 mg Oral TID WC   torsemide  100 mg Oral Q T,Th,S,Su   Continuous Infusions:  cefTRIAXone (ROCEPHIN)  IV 2 g  (06/26/23 1838)   vancomycin 750 mg (06/26/23 2148)      LOS: 11 days    Time spent:    Zannie Cove, MD [facility]   06/27/2023, 11:20 AM

## 2023-06-27 NOTE — Progress Notes (Signed)
 Contacted TCU RN to provide update that pt will not be at treatment tomorrow. Will assist as needed.   Olivia Canter Renal Navigator [phone]

## 2023-06-28 DIAGNOSIS — I5033 Acute on chronic diastolic (congestive) heart failure: Secondary | ICD-10-CM | POA: Diagnosis not present

## 2023-06-28 LAB — BASIC METABOLIC PANEL WITH GFR
Anion gap: 14 (ref 5–15)
BUN: 65 mg/dL — ABNORMAL HIGH (ref 8–23)
CO2: 23 mmol/L (ref 22–32)
Calcium: 8.6 mg/dL — ABNORMAL LOW (ref 8.9–10.3)
Chloride: 100 mmol/L (ref 98–111)
Creatinine, Ser: 5.09 mg/dL — ABNORMAL HIGH (ref 0.44–1.00)
GFR, Estimated: 8 mL/min — ABNORMAL LOW (ref >60)
Glucose, Bld: 139 mg/dL — ABNORMAL HIGH (ref 70–99)
Potassium: 4.3 mmol/L (ref 3.5–5.1)
Sodium: 137 mmol/L (ref 135–145)

## 2023-06-28 LAB — CBC
HCT: 24.8 % — ABNORMAL LOW (ref 36.0–46.0)
Hemoglobin: 7.6 g/dL — ABNORMAL LOW (ref 12.0–15.0)
MCH: 28.8 pg (ref 26.0–34.0)
MCHC: 30.6 g/dL (ref 30.0–36.0)
MCV: 93.9 fL (ref 80.0–100.0)
Platelets: 197 10*3/uL (ref 150–400)
RBC: 2.64 MIL/uL — ABNORMAL LOW (ref 3.87–5.11)
RDW: 17.9 % — ABNORMAL HIGH (ref 11.5–15.5)
WBC: 9.6 10*3/uL (ref 4.0–10.5)
nRBC: 0 % (ref 0.0–0.2)

## 2023-06-28 LAB — GLUCOSE, CAPILLARY
Glucose-Capillary: 111 mg/dL — ABNORMAL HIGH (ref 70–99)
Glucose-Capillary: 88 mg/dL (ref 70–99)
Glucose-Capillary: 140 mg/dL — ABNORMAL HIGH (ref 70–99)
Glucose-Capillary: 141 mg/dL — ABNORMAL HIGH (ref 70–99)

## 2023-06-28 MED ORDER — HEPARIN SODIUM (PORCINE) 1000 UNIT/ML IJ SOLN
INTRAMUSCULAR | Status: AC
  Filled 2023-06-28: qty 4

## 2023-06-28 MED ORDER — CEPHALEXIN 250 MG PO CAPS
250.0000 mg | ORAL_CAPSULE | Freq: Two times a day (BID) | ORAL | Status: DC
  Administered 2023-06-28 – 2023-07-02 (×8): 250 mg via ORAL
  Filled 2023-06-28 (×8): qty 1

## 2023-06-28 NOTE — Progress Notes (Signed)
   06/28/23 1705  Mobility  Activity Ambulated with assistance in hallway  Level of Assistance Standby assist, set-up cues, supervision of patient - no hands on  Assistive Device Front wheel walker  Distance Ambulated (ft) 200 ft  Activity Response Tolerated fair  Mobility Referral Yes  Mobility visit 1 Mobility  Mobility Specialist Start Time (ACUTE ONLY) 1653  Mobility Specialist Stop Time (ACUTE ONLY) 1705  Mobility Specialist Time Calculation (min) (ACUTE ONLY) 12 min   Mobility Specialist: Progress Note  During Mobility: HR 95-97  Post-Mobility:    HR 94-96  Pt agreeable to mobility session - received standing at bedside. C/o back pain. Returned to chair with all needs met - call bell within reach. Chair alarm on.   Barnie Mort, BS Mobility Specialist Please contact via SecureChat or  Rehab office at [phone].

## 2023-06-28 NOTE — Plan of Care (Signed)

## 2023-06-28 NOTE — TOC Progression Note (Signed)
 Transition of Care [name]) - Progression Note    Patient Details  Name: [name] MRN: [id number] Date of Birth: Dec 30, 1945  [facility] CM/SW Contact  Leone Haven, RN Phone Number: 06/28/2023, 3:07 PM  Clinical Narrative:    Conts with left internal jugular grraft, HD patient, iv abx changed to po.  TOC conts to follow. Plan for possible dc tomorrow.    Expected Discharge Plan: Home w Home Health Services Barriers to Discharge: Continued Medical Work up  Expected Discharge Plan and Services In-house Referral: NA Discharge Planning Services: CM Consult Post Acute Care Choice: Home Health, Resumption of Svcs/PTA Provider Living arrangements for the past 2 months: Single Family Home Expected Discharge Date: 06/24/23               DME Arranged: N/A DME Agency: NA       HH Arranged: RN, PT, OT, Nurse's Aide, Social Work [employer] Agency: [facility] Date HH Agency Contacted: 06/17/23 Time HH Agency Contacted: 1238 Representative spoke with at [name] Agency: Clifton Custard   Social Determinants of Health (SDOH) Interventions SDOH Screenings   Food Insecurity: No Food Insecurity (05/25/2023)  Housing: Low Risk  (05/25/2023)  Transportation Needs: No Transportation Needs (05/25/2023)  Utilities: Not At Risk (05/25/2023)  Alcohol Screen: Low Risk  (10/25/2021)  Depression (PHQ2-9): Low Risk  (09/12/2020)  Financial Resource Strain: Low Risk  (10/25/2021)  Social Connections: Patient Declined (06/19/2023)  Recent Concern: Social Connections - Socially Isolated (05/25/2023)  Tobacco Use: Low Risk  (06/21/2023)    Readmission Risk Interventions    05/13/2023    4:50 PM 12/27/2021    2:10 PM 11/08/2021   12:56 PM  Readmission Risk Prevention Plan  Transportation Screening Complete Complete Complete  Medication Review (RN Care Manager) Referral to Pharmacy Complete Complete  PCP or Specialist appointment within 3-5 days of discharge Complete Complete Complete  HRI or  Home Care Consult Complete Complete   SW Recovery Care/Counseling Consult Complete Complete Complete  Palliative Care Screening Not Applicable  Not Applicable  Skilled Nursing Facility Not Applicable Not Applicable Not Applicable

## 2023-06-28 NOTE — Progress Notes (Addendum)
 Received patient in bed to unit.  Alert and oriented x4 Informed consent signed and in chart.   TX duration: 3.5  Patient tolerated well.  Transported back to the room via stretcher Alert, without acute distress.  Hand-off given to patient's nurse. Stella RN  Access used: R CVC Access issues: Left AVG present with bruit and thrill. Noted tenderness and swollen   Total UF removed: 1000 Medication(s) given: Tylenol 650 mg Post HD weight: 79.0 KG Post HD VS: 132/49, 74 MAP, 100 RA, 84 P, 12 Resp   Litzi Binning S Siddarth Hsiung RN Kidney Dialysis Unit

## 2023-06-28 NOTE — Progress Notes (Signed)
 New Dialysis Start    Patient identified as new dialysis start. Kidney Education packet assembled and given. Discussed the following items with patient:     Current medications and possible changes once started:  Discussed that patient's medications may change over time.  Ex; hypertension medications and diabetes medication.  Nephrologists will adjust as needed.   Fluid restrictions reviewed:  32 oz daily goal:  All liquids count; soups, ice, jello, fruits. Will also refer dietitian.   Phosphorus and potassium: Handout given showing high potassium and phosphorus foods.  Alternative food and drink options given. Will also refer dietitian.   Family support:  Daughter [name] at bedside.   Outpatient Clinic Resources:  Discussed roles of Outpatient clinic staff and advised to make a list of needs, if any, to talk with outpatient staff if needed.   Care plan schedule: Informed patient of Care Plans in outpatient setting and to participate in the care plan.  An invitation would be given from outpatient clinic.    Dialysis Access Options:  Reviewed access options with patients. Discussed in detail about care at home with new AVG & AVF. Reviewed checking bruit and thrill. If dialysis catheter present, educated that patient could not take showers.  Catheter dressing changes were to be done by outpatient clinic staff only.   Home therapy options:  Educated patient about home therapy options:  PD vs home hemo.  Patient going to TCU, encouraged family to weigh options.   Patient and Daughter verbalized understanding. Will continue to round on patient during admission.    Jean Rosenthal Dialysis Nurse Coordinator [phone]

## 2023-06-28 NOTE — Progress Notes (Addendum)
 Pt has been accepted at TCU at [facility] with 11:15 am chair time. Pt can start on Monday if pt happens to be stable for d/c this weekend. Renal PA has sent orders to clinic and HD arrangements added to AVS as well. Met with pt's family last week regarding arrangements. Will assist as needed.   Olivia Canter Renal Navigator [phone]  Addendum at 11:49 am: Spoke to TCU RN to be advised that pt may d/c this weekend if stable. Clinic confirms being ready to start pt on Monday. Spoke to pt's daughter, [name], via phone to be advised that clinic is aware pt may d/c this weekend if stable and that clinic is prepared to treat pt on Monday if d/c.

## 2023-06-28 NOTE — Progress Notes (Signed)
 [facility] Progress Note   Assessment/ Plan:   Pt is a 78 y.o. yo female  with history of hypertension, renal vein thrombosis on Eliquis, stroke, type II DM, lupus, CKD 4-5 who was recently admitted for CHF exacerbation and progressive CKD presented back with worsening dyspnea on exertion and fluid overload.    # Progressive CKD 5 versus AKI on CKD,: With frequent hospitalization for fluid overload.  Responding to IV diuretics but not po and with worsening renal function d/w patient and family (Daughter on phone 4/2) at length recommend starting dialysis. S/p LUE AVG + TDC placement 4/4 with HD 1, HD 2 4/5,  - HD 06/24/23, 4/9 and due again today -  on MWF schedule  - CLIP complete-  has been accepted at [facility] - OK to d/c when infectious processes ruled out-   possibly tomorrow per the patient-  she knows where to be and when on Monday   # CKD-MBD/hyperphosphatemia: Started sevelamer in the recent visit, monitor lab.  PTH 220.  Phos is great now   #Anemia of CKD: Concern about GI bleed, underwent endoscopy t recently with no sign of bleeding.  Back on Eliquis.  Hb was 6.4 and now 8s. after transfusion.  Recent iron saturation 35% but down to 15% so redosed.  S/p aranesp 100 3/30.     # Hypertension/volume: [name] improved after diuresis.  Torsemide on non-dialysis days   #A fib: on eliquis   # fever:  - cultures drawn, follow - vanc with HD and ceftriaxone too - VVS following to see if AVG needs to come out-- leaning towards AVG reaction     Subjective:    Seen in dialysis-  says her arm is better    Objective:   BP (!) 121/44 (BP Location: Right Arm)   Pulse 87   Temp 99.3 F (37.4 C) (Oral)   Resp 18   Ht (P) 5\' 4"  (1.626 m)   Wt 80 kg   SpO2 99%   BMI (P) 30.27 kg/m   Intake/Output Summary (Last 24 hours) at 06/28/2023 0848 Last data filed at 06/28/2023 0700 Gross per 24 hour  Intake 1087 ml  Output 302 ml  Net 785 ml   Weight change: 2.6  kg  Physical Exam: General:NAD, comfortable Heart:RRR, s1s2 nl Lungs:clear b/l, no crackle Abdomen:soft, Non-tender, non-distended Extremities: minimal edema, LUE AVG +t/b, 2+ radial pulse, slightly tender over AVG-  but better  Neurology: Alert awake and following commands RIJ TDC c/d/I, L AVG + bruit, erythema , less  tenderness distally.   Imaging: No results found.  Labs: BMET Recent Labs  Lab 06/22/23 0232 06/23/23 0227 06/24/23 0255 06/26/23 0219 06/28/23 0253  NA 138 136 135 134* 137  K 3.6 3.7 4.2 3.7 4.3  CL 99 97* 98 98 100  CO2 24 25 23 23 23   GLUCOSE 202* 98 93 139* 139*  BUN 76* 63* 83* 67* 65*  CREATININE 3.69* 3.88* 4.71* 4.76* 5.09*  CALCIUM 8.4* 8.2* 8.3* 8.4* 8.6*  PHOS  --   --  3.9 3.1  --    CBC Recent Labs  Lab 06/25/23 0024 06/26/23 0219 06/27/23 0748 06/28/23 0253  WBC 12.0* 11.9* 9.0 9.6  NEUTROABS  --  9.1*  --   --   HGB 8.2* 7.6* 8.3* 7.6*  HCT 26.4* 24.6* 27.2* 24.8*  MCV 92.0 93.5 92.5 93.9  PLT 184 186 207 197    Medications:     apixaban  2.5 mg Oral BID   cephALEXin  250 mg Oral Q12H   Chlorhexidine Gluconate Cloth  6 each Topical Q0600   colestipol  1 g Oral TID   FLUoxetine  20 mg Oral Daily   folic acid  1 mg Oral Daily   gabapentin  300 mg Oral QHS   insulin aspart  0-6 Units Subcutaneous TID WC   lipase/protease/amylase  36,000 Units Oral TID AC   mirtazapine  7.5 mg Oral QHS   multivitamin  1 tablet Oral QHS   pantoprazole  40 mg Oral BID   polyethylene glycol  17 g Oral Daily   rivastigmine  1.5 mg Oral BID   sevelamer carbonate  800 mg Oral TID WC   torsemide  100 mg Oral Q T,Th,S,Su   [name]  06/28/2023, 8:48 AM

## 2023-06-28 NOTE — Progress Notes (Signed)
 PROGRESS NOTE    [name]  ZOX:[id number] DOB: 09-01-1945 DOA: 06/15/2023 PCP: Renford Dills, MD  77/F with CAD, chronic diastolic CHF, CKD 4, history of DVT, renal vein thrombosis on Eliquis, CVA, depression, memory loss, type 2 diabetes mellitus, history of breast cancer, lupus was admitted 3/8-15 with volume overload, progressive CKD, followed by nephrology, diuresed and discharged home on oral diuretics, also seen by gastroenterology for worsening chronic anemia, EGD and colonoscopy were unremarkable, Eliquis resumed at discharge. -Back in the ED 3/29 with progressive dyspnea on exertion orthopnea lower extremity edema and weight gain. -In the ED hypertensive, otherwise vital signs stable, hemoglobin 7.0, creatinine 4.0, BUN 82, BNP 560, troponin 125, chest x-ray unremarkable -Admitted, started on IV diuretics, nephrology following -4/2, vascular surgery consult, plan to start HD -4/4: tunneled HD cath placed, left arm AV graft -4/7: low BP, DC held, ? Infection at AVG site -4/8: started Abx  Subjective: In good spirits today, feels better overall  Assessment and Plan:  Acute on chronic HFpEF Progressive CKD stage 4/5 -Recent hospitalization for same  -Echo 3/25 with EF 60-65, grade 1 DD, normal RV  -Limited response to diuretics, in the background of progressive CKD -started HD 4/4 -Nephrology following, dialysis today day, anticipate discharge this weekend if stable  Left arm cellulitis, AVG site vs graft reaction -Low-grade fevers 4/7, afebrile since starting antibiotics -Vascular surgery following, WBC has improved on Vanco/ceftriaxone -Blood cultures negative thus far -Changed to oral Keflex today, discharge home tomorrow if stable, close follow-up with vascular   Anemia of chronic disease, iron deficiency -Underwent GI workup few weeks ago which was unremarkable -Baseline hemoglobin 8-10, lower this admission, transfused 1 unit PRBC 3/30 -Given IV iron and  Aranesp, will need repeat EPO  Left foot, gout flare -Improved, completed 3 days of prednisone   CAD status post PCI in 2000 EKG without acute ischemic changes.  Stable   Hypertension -hold amlodipine.   History of renal vein thrombosis 09/2017 and DVT 06/2020 -Continue Eliquis   Depression Mild Dementia -Continue fluoxetine, rivastigmine   Type 2 diabetes A1c 6.2 on 05/08/2023.  Sensitive sliding scale   Chronic pain/neuropathy Continue gabapentin.   GERD/gastritis Continue Protonix.   DVT prophylaxis: Eliquis Code Status: Full code Family Communication: No family at bedside today, will update daughter Disposition Plan: Home pending improvement in left arm, likely tomorrow  Consultants:    Procedures:   Antimicrobials:    Objective: Vitals:   06/28/23 0348 06/28/23 0349 06/28/23 0750 06/28/23 0918  BP: (!) 151/53  (!) 121/44 (!) 124/46  Pulse: 83 84 87   Resp: 18     Temp: 99.1 F (37.3 C)  99.3 F (37.4 C)   TempSrc: Oral  Oral   SpO2: 94% 94% 99%   Weight: 80 kg     Height:        Intake/Output Summary (Last 24 hours) at 06/28/2023 1024 Last data filed at 06/28/2023 0700 Gross per 24 hour  Intake 967 ml  Output 2 ml  Net 965 ml   Filed Weights   06/26/23 1427 06/27/23 0410 06/28/23 0348  Weight: 77.4 kg 76.1 kg 80 kg    Examination: Gen: Awake, Alert, Oriented X 3,  HEENT: no JVD Lungs: Good air movement bilaterally, CTAB CVS: S1S2/RRR Abd: soft, Non tender, non distended, BS present Extremities: Trace edema, Left arm AV graft with erythema and tenderness improving Skin: As above    Data Reviewed:   CBC: Recent Labs  Lab 06/23/23 0227  06/25/23 0024 06/26/23 0219 06/27/23 0748 06/28/23 0253  WBC 10.8* 12.0* 11.9* 9.0 9.6  NEUTROABS  --   --  9.1*  --   --   HGB 8.1* 8.2* 7.6* 8.3* 7.6*  HCT 26.2* 26.4* 24.6* 27.2* 24.8*  MCV 91.0 92.0 93.5 92.5 93.9  PLT 204 184 186 207 197   Basic Metabolic Panel: Recent Labs  Lab  06/22/23 0232 06/23/23 0227 06/24/23 0255 06/26/23 0219 06/28/23 0253  NA 138 136 135 134* 137  K 3.6 3.7 4.2 3.7 4.3  CL 99 97* 98 98 100  CO2 24 25 23 23 23   GLUCOSE 202* 98 93 139* 139*  BUN 76* 63* 83* 67* 65*  CREATININE 3.69* 3.88* 4.71* 4.76* 5.09*  CALCIUM 8.4* 8.2* 8.3* 8.4* 8.6*  PHOS  --   --  3.9 3.1  --    GFR: Estimated Creatinine Clearance: 9.8 mL/min (A) (by C-G formula based on SCr of 5.09 mg/dL (H)). Liver Function Tests: Recent Labs  Lab 06/24/23 0255 06/26/23 0219  ALBUMIN 2.1* 2.0*   No results for input(s): "LIPASE", "AMYLASE" in the last 168 hours. No results for input(s): "AMMONIA" in the last 168 hours. Coagulation Profile: No results for input(s): "INR", "PROTIME" in the last 168 hours. Cardiac Enzymes: No results for input(s): "CKTOTAL", "CKMB", "CKMBINDEX", "TROPONINI" in the last 168 hours. BNP (last 3 results) No results for input(s): "PROBNP" in the last 8760 hours. HbA1C: No results for input(s): "HGBA1C" in the last 72 hours. CBG: Recent Labs  Lab 06/27/23 1125 06/27/23 1604 06/27/23 2143 06/28/23 0611 06/28/23 0634  GLUCAP 125* 171* 234* 111* 88   Lipid Profile: No results for input(s): "CHOL", "HDL", "LDLCALC", "TRIG", "CHOLHDL", "LDLDIRECT" in the last 72 hours. Thyroid Function Tests: No results for input(s): "TSH", "T4TOTAL", "FREET4", "T3FREE", "THYROIDAB" in the last 72 hours. Anemia Panel: No results for input(s): "VITAMINB12", "FOLATE", "FERRITIN", "TIBC", "IRON", "RETICCTPCT" in the last 72 hours.  Urine analysis:    Component Value Date/Time   COLORURINE STRAW (A) 05/26/2023 1610   APPEARANCEUR CLEAR 05/26/2023 1610   APPEARANCEUR Clear 08/22/2018 1620   LABSPEC 1.008 05/26/2023 1610   LABSPEC 1.020 03/03/2014 1341   PHURINE 5.0 05/26/2023 1610   GLUCOSEU NEGATIVE 05/26/2023 1610   GLUCOSEU Negative 03/03/2014 1341   HGBUR SMALL (A) 05/26/2023 1610   BILIRUBINUR NEGATIVE 05/26/2023 1610   BILIRUBINUR Negative  08/22/2018 1620   BILIRUBINUR Negative 03/03/2014 1341   KETONESUR NEGATIVE 05/26/2023 1610   PROTEINUR 100 (A) 05/26/2023 1610   UROBILINOGEN 0.2 03/03/2014 1341   NITRITE NEGATIVE 05/26/2023 1610   LEUKOCYTESUR NEGATIVE 05/26/2023 1610   LEUKOCYTESUR Negative 03/03/2014 1341   Sepsis Labs: @LABRCNTIP (procalcitonin:4,lacticidven:4)  ) Recent Results (from the past 240 hours)  Culture, blood (Routine X 2) w Reflex to ID Panel     Status: None (Preliminary result)   Collection Time: 06/25/23 12:24 AM   Specimen: BLOOD RIGHT HAND  Result Value Ref Range Status   Specimen Description BLOOD RIGHT HAND  Final   Special Requests   Final    BOTTLES DRAWN AEROBIC AND ANAEROBIC Blood Culture adequate volume   Culture   Final    NO GROWTH 3 DAYS Performed at [facility], [address], [place], [place] [address]    Report Status PENDING  Incomplete  Culture, blood (Routine X 2) w Reflex to ID Panel     Status: None (Preliminary result)   Collection Time: 06/25/23 12:24 AM   Specimen: BLOOD RIGHT ARM  Result Value Ref Range Status   Specimen Description BLOOD RIGHT ARM  Final   Special Requests   Final    BOTTLES DRAWN AEROBIC AND ANAEROBIC Blood Culture adequate volume   Culture   Final    NO GROWTH 3 DAYS Performed at [facility], [address], [place], [place] [address]    Report Status PENDING  Incomplete     Radiology Studies: No results found.     Scheduled Meds:  apixaban  2.5 mg Oral BID   cephALEXin  250 mg Oral Q12H   Chlorhexidine Gluconate Cloth  6 each Topical Q0600   colestipol  1 g Oral TID   FLUoxetine  20 mg Oral Daily   folic acid  1 mg Oral Daily   gabapentin  300 mg Oral QHS   insulin aspart  0-6 Units Subcutaneous TID WC   lipase/protease/amylase  36,000 Units Oral TID AC   mirtazapine  7.5 mg Oral QHS   multivitamin  1 tablet Oral QHS   pantoprazole  40 mg Oral BID   polyethylene glycol  17 g Oral Daily   rivastigmine  1.5 mg  Oral BID   sevelamer carbonate  800 mg Oral TID WC   torsemide  100 mg Oral Q T,Th,S,Su   Continuous Infusions:      LOS: 12 days    Time spent:    Zannie Cove, MD [facility]   06/28/2023, 10:24 AM

## 2023-06-28 NOTE — Procedures (Signed)
 Patient was seen on dialysis and the procedure was supervised.  BFR 300  Via TDC BP is  116/50.   Patient appears to be tolerating treatment well  [name] 06/28/2023

## 2023-06-28 NOTE — Progress Notes (Signed)
  Progress Note    06/28/2023 1:14 PM 7 Days Post-Op  Subjective:  having some left hand pain  Vitals:   06/28/23 1130 06/28/23 1200  BP: (!) 133/51 (!) 134/52  Pulse:    Resp:    Temp:    SpO2:      Physical Exam: She is awake and alert Left upper arm does not appear infected, there is swelling around the graft with a palpable thrill No palpable radial pulse with patient on dialysis her hand is warm and she has good grip strength but does complain of numbness and tingling in the fingertips  CBC    Component Value Date/Time   WBC 9.6 06/28/2023 0253   RBC 2.64 (L) 06/28/2023 0253   HGB 7.6 (L) 06/28/2023 0253   HGB 9.4 (L) 04/18/2023 1434   HGB 9.3 (L) 03/14/2017 0810   HCT 24.8 (L) 06/28/2023 0253   HCT 26.4 (L) 07/21/2019 0809   HCT 29.2 (L) 03/14/2017 0810   PLT 197 06/28/2023 0253   PLT 235 04/18/2023 1434   PLT 292 03/14/2017 0810   PLT 351 10/11/2016 1139   MCV 93.9 06/28/2023 0253   MCV 81.3 03/14/2017 0810   MCH Katie.8 06/28/2023 0253   MCHC 30.6 06/28/2023 0253   RDW 17.9 (H) 06/28/2023 0253   RDW 14.0 03/14/2017 0810   LYMPHSABS 1.5 06/26/2023 0219   LYMPHSABS 1.4 03/14/2017 0810   MONOABS 0.8 06/26/2023 0219   MONOABS 0.4 03/14/2017 0810   EOSABS 0.1 06/26/2023 0219   EOSABS 0.1 03/14/2017 0810   BASOSABS 0.0 06/26/2023 0219   BASOSABS 0.0 03/14/2017 0810    BMET    Component Value Date/Time   NA 137 06/28/2023 0253   NA 144 07/19/2020 1458   NA 140 03/14/2017 0810   K 4.3 06/28/2023 0253   K 3.8 03/14/2017 0810   CL 100 06/28/2023 0253   CO2 23 06/28/2023 0253   CO2 27 03/14/2017 0810   GLUCOSE 139 (H) 06/28/2023 0253   GLUCOSE 177 (H) 03/14/2017 0810   BUN 65 (H) 06/28/2023 0253   BUN 85 (HH) 07/19/2020 1458   BUN 35.6 (H) 03/14/2017 0810   CREATININE 5.09 (H) 06/28/2023 0253   CREATININE 2.31 (H) 10/25/2022 1355   CREATININE 1.8 (H) 03/14/2017 0810   CALCIUM 8.6 (L) 06/28/2023 0253   CALCIUM 8.3 (L) 06/18/2023 0334   CALCIUM 9.6  03/14/2017 0810   GFRNONAA 8 (L) 06/28/2023 0253   GFRNONAA 22 (L) 10/25/2022 1355   GFRNONAA 45 (L) 12/17/2013 1158   GFRAA 21 (L) 12/21/2019 0845   GFRAA 52 (L) 12/17/2013 1158    INR    Component Value Date/Time   INR 1.5 (H) 07/28/2022 1615     Intake/Output Summary (Last 24 hours) at 06/28/2023 1314 Last data filed at 06/28/2023 0700 Gross per 24 hour  Intake 847 ml  Output 2 ml  Net 845 ml     Assessment/plan:  78 y.o. [name] is s/p [facility] placement and left arm AV graft.  The left arm does not appear infected to me today.  She is having some mild numbness and tingling and pain while on dialysis but does not have any evidence of frank steal at this time.  Will continue graft and would only perform excision if absolutely necessary.    Atilano Covelli C. Randie Heinz, MD [facility] Office: [phone] Pager: [phone]  06/28/2023 1:14 PM

## 2023-06-29 DIAGNOSIS — I5033 Acute on chronic diastolic (congestive) heart failure: Secondary | ICD-10-CM | POA: Diagnosis not present

## 2023-06-29 LAB — BASIC METABOLIC PANEL WITH GFR
Anion gap: 11 (ref 5–15)
BUN: 40 mg/dL — ABNORMAL HIGH (ref 8–23)
CO2: 26 mmol/L (ref 22–32)
Calcium: 8.8 mg/dL — ABNORMAL LOW (ref 8.9–10.3)
Chloride: 98 mmol/L (ref 98–111)
Creatinine, Ser: 3.3 mg/dL — ABNORMAL HIGH (ref 0.44–1.00)
GFR, Estimated: 14 mL/min — ABNORMAL LOW (ref >60)
Glucose, Bld: 107 mg/dL — ABNORMAL HIGH (ref 70–99)
Potassium: 4.1 mmol/L (ref 3.5–5.1)
Sodium: 135 mmol/L (ref 135–145)

## 2023-06-29 LAB — GLUCOSE, CAPILLARY
Glucose-Capillary: 88 mg/dL (ref 70–99)
Glucose-Capillary: 121 mg/dL — ABNORMAL HIGH (ref 70–99)
Glucose-Capillary: 151 mg/dL — ABNORMAL HIGH (ref 70–99)
Glucose-Capillary: 193 mg/dL — ABNORMAL HIGH (ref 70–99)

## 2023-06-29 LAB — CBC
HCT: 25.3 % — ABNORMAL LOW (ref 36.0–46.0)
Hemoglobin: 7.9 g/dL — ABNORMAL LOW (ref 12.0–15.0)
MCH: 28.8 pg (ref 26.0–34.0)
MCHC: 31.2 g/dL (ref 30.0–36.0)
MCV: 92.3 fL (ref 80.0–100.0)
Platelets: 199 10*3/uL (ref 150–400)
RBC: 2.74 MIL/uL — ABNORMAL LOW (ref 3.87–5.11)
RDW: 17.6 % — ABNORMAL HIGH (ref 11.5–15.5)
WBC: 8.6 10*3/uL (ref 4.0–10.5)
nRBC: 0 % (ref 0.0–0.2)

## 2023-06-29 MED ORDER — HYDROCODONE-ACETAMINOPHEN 5-325 MG PO TABS
1.0000 | ORAL_TABLET | Freq: Four times a day (QID) | ORAL | Status: DC | PRN
  Administered 2023-06-29 – 2023-06-30 (×3): 1 via ORAL
  Filled 2023-06-29 (×3): qty 1

## 2023-06-29 MED ORDER — HEPARIN (PORCINE) 25000 UT/250ML-% IV SOLN
1650.0000 [IU]/h | INTRAVENOUS | Status: DC
  Administered 2023-06-29: 1150 [IU]/h via INTRAVENOUS
  Administered 2023-06-30: 1300 [IU]/h via INTRAVENOUS
  Filled 2023-06-29 (×2): qty 250

## 2023-06-29 NOTE — Progress Notes (Signed)
 PROGRESS NOTE    Shadaya [place] Nack  QIO:[id number] DOB: December 22, 1945 DOA: 06/15/2023 PCP: Merl Star, MD  77/F with CAD, chronic diastolic CHF, CKD 4, history of DVT, renal vein thrombosis on Eliquis, CVA, depression, memory loss, type 2 diabetes mellitus, history of breast cancer, lupus was admitted 3/8-15 with volume overload, progressive CKD, followed by nephrology, diuresed and discharged home on oral diuretics, also seen by gastroenterology for worsening chronic anemia, EGD and colonoscopy were unremarkable, Eliquis resumed at discharge. -Back in the ED 3/29 with progressive dyspnea on exertion orthopnea lower extremity edema and weight gain. -In the ED hypertensive, otherwise vital signs stable, hemoglobin 7.0, creatinine 4.0, BUN 82, BNP 560, troponin 125, chest x-ray unremarkable -Admitted, started on IV diuretics, nephrology following -4/2, vascular surgery consult, plan to start HD -4/4: tunneled HD cath placed, left arm AV graft -4/7: low BP, DC held, ? Infection at AVG site -4/8: started Abx  Subjective: -Still has pain in her left arm  Assessment and Plan:  Acute on chronic HFpEF Progressive CKD stage 4/5 -Recent hospitalization for same  -Echo 3/25 with EF 60-65, grade 1 DD, normal RV  -Limited response to diuretics, in the background of progressive CKD -started HD 4/4 -Nephrology following, dialyzed yesterday, anticipate discharge this weekend followed by outpatient HD on Monday  Left arm cellulitis, AVG site vs graft reaction -Low-grade fevers 4/7, afebrile since starting antibiotics -Vascular surgery following, WBC has improved on Vanco/ceftriaxone -Blood cultures negative thus far -Changed to oral Keflex, still having fair bit of pain, add oral hydrocodone, DC home later today or in a.m.   Anemia of chronic disease, iron deficiency -Underwent GI workup few weeks ago which was unremarkable -Baseline hemoglobin 8-10, lower this admission, transfused 1 unit PRBC  3/30 -Given IV iron and Aranesp  Left foot, gout flare -Improved, completed 3 days of prednisone   CAD status post PCI in 2000 EKG without acute ischemic changes.  Stable   Hypertension -hold amlodipine.   History of renal vein thrombosis 09/2017 and DVT 06/2020 -Continue Eliquis   Depression Mild Dementia -Continue fluoxetine, rivastigmine   Type 2 diabetes A1c 6.2 on 05/08/2023.  Sensitive sliding scale   Chronic pain/neuropathy Continue gabapentin.   GERD/gastritis Continue Protonix.   DVT prophylaxis: Eliquis Code Status: Full code Family Communication: No family at bedside today, will update daughter Disposition Plan: Home pending improvement in left arm, likely tomorrow  Consultants:    Procedures:   Antimicrobials:    Objective: Vitals:   06/28/23 2025 06/29/23 0015 06/29/23 0421 06/29/23 0746  BP: (!) 139/52 (!) 156/56 (!) 153/57 (!) 165/66  Pulse: 78 80 81 87  Resp: 18 17 18    Temp: 98.5 F (36.9 C) 98.3 F (36.8 C) 98.5 F (36.9 C) 98.8 F (37.1 C)  TempSrc: Oral Oral Oral Oral  SpO2: 100% 96% 95% 97%  Weight:   72.8 kg   Height:        Intake/Output Summary (Last 24 hours) at 06/29/2023 1157 Last data filed at 06/28/2023 2130 Gross per 24 hour  Intake 100 ml  Output 1000 ml  Net -900 ml   Filed Weights   06/28/23 0348 06/28/23 1230 06/29/23 0421  Weight: 80 kg 79 kg 72.8 kg    Examination: Gen: Awake, Alert, Oriented X 3,  HEENT: no JVD Lungs: Good air movement bilaterally, CTAB CVS: S1S2/RRR Abd: soft, Non tender, non distended, BS present Extremities: Trace edema, Left arm AV graft with erythema and tenderness improving Skin: As above  Data Reviewed:   CBC: Recent Labs  Lab 06/25/23 0024 06/26/23 0219 06/27/23 0748 06/28/23 0253 06/29/23 0412  WBC 12.0* 11.9* 9.0 9.6 8.6  NEUTROABS  --  9.1*  --   --   --   HGB 8.2* 7.6* 8.3* 7.6* 7.9*  HCT 26.4* 24.6* 27.2* 24.8* 25.3*  MCV 92.0 93.5 92.5 93.9 92.3  PLT 184 186  207 197 199   Basic Metabolic Panel: Recent Labs  Lab 06/23/23 0227 06/24/23 0255 06/26/23 0219 06/28/23 0253 06/29/23 0412  NA 136 135 134* 137 135  K 3.7 4.2 3.7 4.3 4.1  CL 97* 98 98 100 98  CO2 25 23 23 23 26   GLUCOSE 98 93 139* 139* 107*  BUN 63* 83* 67* 65* 40*  CREATININE 3.88* 4.71* 4.76* 5.09* 3.30*  CALCIUM 8.2* 8.3* 8.4* 8.6* 8.8*  PHOS  --  3.9 3.1  --   --    GFR: Estimated Creatinine Clearance: 14.4 mL/min (A) (by C-G formula based on SCr of 3.3 mg/dL (H)). Liver Function Tests: Recent Labs  Lab 06/24/23 0255 06/26/23 0219  ALBUMIN 2.1* 2.0*   No results for input(s): "LIPASE", "AMYLASE" in the last 168 hours. No results for input(s): "AMMONIA" in the last 168 hours. Coagulation Profile: No results for input(s): "INR", "PROTIME" in the last 168 hours. Cardiac Enzymes: No results for input(s): "CKTOTAL", "CKMB", "CKMBINDEX", "TROPONINI" in the last 168 hours. BNP (last 3 results) No results for input(s): "PROBNP" in the last 8760 hours. HbA1C: No results for input(s): "HGBA1C" in the last 72 hours. CBG: Recent Labs  Lab 06/28/23 0611 06/28/23 0634 06/28/23 1607 06/28/23 2042 06/29/23 0546  GLUCAP 111* 88 140* 141* 88   Lipid Profile: No results for input(s): "CHOL", "HDL", "LDLCALC", "TRIG", "CHOLHDL", "LDLDIRECT" in the last 72 hours. Thyroid Function Tests: No results for input(s): "TSH", "T4TOTAL", "FREET4", "T3FREE", "THYROIDAB" in the last 72 hours. Anemia Panel: No results for input(s): "VITAMINB12", "FOLATE", "FERRITIN", "TIBC", "IRON", "RETICCTPCT" in the last 72 hours.  Urine analysis:    Component Value Date/Time   COLORURINE STRAW (A) 05/26/2023 1610   APPEARANCEUR CLEAR 05/26/2023 1610   APPEARANCEUR Clear 08/22/2018 1620   LABSPEC 1.008 05/26/2023 1610   LABSPEC 1.020 03/03/2014 1341   PHURINE 5.0 05/26/2023 1610   GLUCOSEU NEGATIVE 05/26/2023 1610   GLUCOSEU Negative 03/03/2014 1341   HGBUR SMALL (A) 05/26/2023 1610    BILIRUBINUR NEGATIVE 05/26/2023 1610   BILIRUBINUR Negative 08/22/2018 1620   BILIRUBINUR Negative 03/03/2014 1341   KETONESUR NEGATIVE 05/26/2023 1610   PROTEINUR 100 (A) 05/26/2023 1610   UROBILINOGEN 0.2 03/03/2014 1341   NITRITE NEGATIVE 05/26/2023 1610   LEUKOCYTESUR NEGATIVE 05/26/2023 1610   LEUKOCYTESUR Negative 03/03/2014 1341   Sepsis Labs: @LABRCNTIP (procalcitonin:4,lacticidven:4)  ) Recent Results (from the past 240 hours)  Culture, blood (Routine X 2) w Reflex to ID Panel     Status: None (Preliminary result)   Collection Time: 06/25/23 12:24 AM   Specimen: BLOOD RIGHT HAND  Result Value Ref Range Status   Specimen Description BLOOD RIGHT HAND  Final   Special Requests   Final    BOTTLES DRAWN AEROBIC AND ANAEROBIC Blood Culture adequate volume   Culture   Final    NO GROWTH 4 DAYS Performed at [facility], [address] [place], [place] [address]    Report Status PENDING  Incomplete  Culture, blood (Routine X 2) w Reflex to ID Panel     Status: None (Preliminary result)   Collection  Time: 06/25/23 12:24 AM   Specimen: BLOOD RIGHT ARM  Result Value Ref Range Status   Specimen Description BLOOD RIGHT ARM  Final   Special Requests   Final    BOTTLES DRAWN AEROBIC AND ANAEROBIC Blood Culture adequate volume   Culture   Final    NO GROWTH 4 DAYS Performed at [facility], [address] [place], [place] [address]    Report Status PENDING  Incomplete     Radiology Studies: No results found.     Scheduled Meds:  apixaban  2.5 mg Oral BID   cephALEXin  250 mg Oral Q12H   Chlorhexidine Gluconate Cloth  6 each Topical Q0600   colestipol  1 g Oral TID   FLUoxetine  20 mg Oral Daily   folic acid  1 mg Oral Daily   gabapentin  300 mg Oral QHS   insulin aspart  0-6 Units Subcutaneous TID WC   lipase/protease/amylase  36,000 Units Oral TID AC   mirtazapine  7.5 mg Oral QHS   multivitamin  1 tablet Oral QHS   pantoprazole  40 mg Oral BID    polyethylene glycol  17 g Oral Daily   rivastigmine  1.5 mg Oral BID   sevelamer carbonate  800 mg Oral TID WC   torsemide  100 mg Oral Q T,Th,S,Su   Continuous Infusions:      LOS: 13 days    Time spent:    Deforest Fast, MD [facility]   06/29/2023, 11:57 AM

## 2023-06-29 NOTE — Progress Notes (Signed)
 PHARMACY - ANTICOAGULATION CONSULT NOTE  Pharmacy Consult for heparin Indication: hx DVT and renal vein thrombus   Allergies  Allergen Reactions   Anesthetics, Halogenated Other (See Comments)    Per patient's daughter, she can not tolerate anesthetics.    Etomidate Other (See Comments)    Heart stopped during anesthesia   Fentanyl Other (See Comments)    Heart stopped during anesthesia    Percocet [Oxycodone-Acetaminophen] Other (See Comments)    Headaches    Diazepam Other (See Comments)    Agitation   Lorazepam Nausea Only   Tramadol Hcl Nausea And Vomiting   Haloperidol Nausea And Vomiting    **Haldol**   Morphine Sulfate Nausea Only    agitation   Propoxyphene Nausea And Vomiting    **Darvocet** no longer available   Versed [Midazolam] Other (See Comments)    Heart stopped during anesthesia    Patient Measurements: Height: (P) 5\' 4"  (162.6 cm) Weight: 72.8 kg (160 lb 7.9 oz) IBW/kg (Calculated) : (P) 54.7 HEPARIN DW (KG): (P) 71  Vital Signs: Temp: 98.7 F (37.1 C) (04/12 1931) Temp Source: Oral (04/12 1931) BP: 168/60 (04/12 1931) Pulse Rate: 82 (04/12 1931)  Labs: Recent Labs    06/27/23 0748 06/28/23 0253 06/29/23 0412  HGB 8.3* 7.6* 7.9*  HCT 27.2* 24.8* 25.3*  PLT 207 197 199  CREATININE  --  5.09* 3.30*    Estimated Creatinine Clearance: 14.4 mL/min (A) (by C-G formula based on SCr of 3.3 mg/dL (H)).   Medical History: Past Medical History:  Diagnosis Date   Abdominal discomfort    Chronic N/V/D. Presumptive dx Crohn's dx per elevated p ANCA. Failed Entocort and Pentasa. Sep 2003 - ileocolectomy c anastomosis per Dr Duwaine Gins 2/2 adhesions - path was hegative for Crohns. EGD, Sm bowel follow through (11/03), and an eteroclysis (10/03) were unrevealing. Cuases hypomag and hypocalcemia.   Adnexal mass 10/2001   s/p lap BSO (R ovarian fibroma) & lysis of adhesions   Allergy    Seasonal   Anemia    Multifactorial. Baseline HgB 10-11 ish. B12  def - 150 in 3/10. Fe Def - ferritin 35 3/10. Both are being repleted.   Breast cancer (HCC) 03/16/2013   right, 5 o'clock   CAD (coronary artery disease) 1996   1996 - PTCA and angioplasty diagonal branch. 2000 - Rotoblator & angiopllasty of diagonal. 2006 - subendocardial AMI, DES to proximal LAD.Aaron Aas Also had 90% stenosis in distal apical LAD. EF 55 with apical hypokinesis. Indefinite ASA and Plavix.   CHF (congestive heart failure) (HCC)    Chronic kidney disease, stage 4 (severe) (HCC)    Chronic pain    CT 10/10 = Spinal stenosis L2 - S1.   COVID-19 11/02/2020   Diabetes mellitus    Insulin dependent   Diabetes mellitus type 2 in obese 05/03/2010   Managed on lantus and novolog. Has diabetic nephropathy. Metformin D/C'd 2012 2/2 creatinine.  No diabetic retinopathy per 3/11.    Gout    Hx of radiation therapy 06/02/13- 07/16/13   right rbeast 4500 cGy 25 sessions, right breast boost 1600 cGy in 8 sessions   Hyperlipidemia    Managed with both a statin and Welchol. Welchol stopped 2014 2/2 cost and started on fenofibrate    Hypertension    2006 B renal arteries patent. 2003 MRA - no RAS. 2003 pheo W/U Dr Francetta Innocent reportedly negative.   Hypoxia 07/23/2017   Lupus    Lymphedema of breast    Personal history  of radiation therapy 2015   Pneumonia of left lung due to infectious organism    RBBB    Renal vein thrombosis (HCC)    SBO (small bowel obstruction) (HCC) 09/17/2017   Secondary hyperparathyroidism (HCC)    Stroke [facility])    Incidental finding MRI 2002 L lacunar infarct   Vitamin B12 deficiency    Vitamin D deficiency    Wears dentures    top    Medications:  Medications Prior to Admission  Medication Sig Dispense Refill Last Dose/Taking   acetaminophen (TYLENOL) 650 MG CR tablet Take 1,300 mg by mouth every 8 (eight) hours as needed for pain.   Past Week   amLODipine (NORVASC) 10 MG tablet Take 1 tablet (10 mg total) by mouth daily. 90 tablet 3 06/15/2023 Morning   apixaban  (ELIQUIS) 2.5 MG TABS tablet Take 1 tablet (2.5 mg total) by mouth 2 (two) times daily. 60 tablet 0 06/15/2023 at 10:15 AM   colestipol (COLESTID) 1 g tablet Take 1 tablet (1 g total) by mouth 3 (three) times daily. 90 tablet 2 06/15/2023 Noon   diphenoxylate-atropine (LOMOTIL) 2.5-0.025 MG tablet Take 1 tablet by mouth 2 (two) times daily as needed for diarrhea or loose stools. 30 tablet 1 Past Month   famotidine (PEPCID) 20 MG tablet Take 20 mg by mouth daily as needed for heartburn or indigestion.   Past Week   FLUoxetine (PROZAC) 20 MG capsule Take 20 mg by mouth daily.   06/15/2023 Morning   folic acid (FOLVITE) 1 MG tablet Take 1 tablet (1 mg total) by mouth daily. 30 tablet 2 06/15/2023 Morning   furosemide (LASIX) 40 MG tablet Take 1 tablet (40 mg total) by mouth daily. (Patient taking differently: Take 40-80 mg by mouth See admin instructions. Take 1 tablet by mouth daily and may take an additional tablet for excess swelling) 90 tablet 2 06/15/2023 at 10:15 AM   gabapentin (NEURONTIN) 300 MG capsule Take 300 mg by mouth at bedtime.   06/14/2023 Bedtime   HYDROcodone-acetaminophen (NORCO/VICODIN) 5-325 MG tablet Take 2 tablets by mouth every 6 (six) hours as needed. 10 tablet 0 Past Week   lipase/protease/amylase (CREON) 36000 UNITS CPEP capsule Take 1 capsule (36,000 Units total) by mouth 3 (three) times daily before meals. 180 capsule 2 06/15/2023 Noon   loperamide (IMODIUM) 2 MG capsule Take 1 capsule (2 mg total) by mouth as needed for diarrhea or loose stools. 30 capsule 0 Past Month   mirtazapine (REMERON) 7.5 MG tablet Take 7.5 mg by mouth at bedtime.   06/14/2023 Bedtime   pantoprazole (PROTONIX) 40 MG tablet Take 1 tablet (40 mg total) by mouth 2 (two) times daily. (Patient taking differently: Take 40 mg by mouth daily.) 30 tablet 0 06/15/2023 Morning   rivastigmine (EXELON) 1.5 MG capsule Take 1 capsule (1.5 mg total) by mouth 2 (two) times daily. 60 capsule 6 06/15/2023 Morning   sevelamer  carbonate (RENVELA) 800 MG tablet Take 1 tablet (800 mg total) by mouth 3 (three) times daily with meals. 90 tablet 0 06/15/2023 Noon   sodium bicarbonate 650 MG tablet Take 1 tablet (650 mg total) by mouth 2 (two) times daily. 60 tablet 0 06/15/2023 Morning   tiZANidine (ZANAFLEX) 2 MG tablet Take 2 mg by mouth at bedtime as needed for muscle spasms.   Past Week   nitroGLYCERIN (NITROSTAT) 0.4 MG SL tablet DISSOLVE 1 TABLET UNDER THE TONGUE EVERY 5 MINUTES AS  NEEDED FOR CHEST PAIN. MAX  OF 3 TABLETS  IN 15 MINUTES. CALL 911 IF PAIN PERSISTS. (Patient not taking: Reported on 06/15/2023) 25 tablet 0 Not Taking   Scheduled:   cephALEXin  250 mg Oral Q12H   Chlorhexidine Gluconate Cloth  6 each Topical Q0600   colestipol  1 g Oral TID   FLUoxetine  20 mg Oral Daily   folic acid  1 mg Oral Daily   gabapentin  300 mg Oral QHS   insulin aspart  0-6 Units Subcutaneous TID WC   lipase/protease/amylase  36,000 Units Oral TID AC   mirtazapine  7.5 mg Oral QHS   multivitamin  1 tablet Oral QHS   pantoprazole  40 mg Oral BID   polyethylene glycol  17 g Oral Daily   rivastigmine  1.5 mg Oral BID   sevelamer carbonate  800 mg Oral TID WC   torsemide  100 mg Oral Q T,Th,S,Su    Assessment: 78 yo female on apixaban for hx DVT and renal vein thrombus. Plans are for left arm AV graft excision next week. Pharmacy consulted to dose heparin   -Last apixaban dose given 4/12 ~ 10am -hg= 7.9, plt= 199   Goal of Therapy:  Heparin level 0.3-0.7 units/ml aPTT 66-102 seconds Monitor platelets by anticoagulation protocol: Yes   Plan:  -Start heparin 1150 units/hr at 10pm -heparin level and aPTT in 8 hrs  Baxter Limber, PharmD Clinical Pharmacist **Pharmacist phone directory can now be found on [email or web address] ([email or web address]).  Listed under [facility].

## 2023-06-29 NOTE — Progress Notes (Signed)
 Patient seen and examined at bedside  Status post [facility] placement, left arm AV graft last week. No concern for infection, but has numbness, with weakness in the hand.  I think that she does have steal syndrome that needs to be addressed.  No symptoms are noted.  Will plan for graft excision Monday v Tuesday Please hold eliquis  Kayla Part MD

## 2023-06-29 NOTE — Progress Notes (Signed)
 Pt family called and asked that vascular be re consulted and that a doppler and US  of arm with new graft be done due to increased swelling and increased pain as it us  extremely unusual for pt to ask for pain medications.  Notified Dr Nevin Barcelona and paged  Rhynes PA-C for vascular team

## 2023-06-29 NOTE — Significant Event (Addendum)
 As requested by vascular surgery Eliquis held and will do heparin bridging for now until restarting Eliquis.  Patient is on anticoagulation for renal vein thrombosis and DVT.  [name].

## 2023-06-29 NOTE — Plan of Care (Signed)
  Problem: Coping: Goal: Ability to adjust to condition or change in health will improve Outcome: Progressing   Problem: Nutritional: Goal: Maintenance of adequate nutrition will improve Outcome: Progressing   Problem: Education: Goal: Knowledge of General Education information will improve Description: Including pain rating scale, medication(s)/side effects and non-pharmacologic comfort measures Outcome: Progressing

## 2023-06-29 NOTE — Progress Notes (Signed)
 [facility] Progress Note   Assessment/ Plan:   Pt is a 78 y.o. yo female  with  hypertension, renal vein thrombosis on Eliquis, stroke, type II DM, lupus, CKD 4-5 who was recently admitted for CHF exacerbation and progressive CKD presented back with worsening dyspnea on exertion and fluid overload.    # Progressive CKD 5 versus AKI on CKD,: With frequent hospitalization for fluid overload.  Responding to IV diuretics but not po and with worsening renal function d/w patient and family (Daughter on phone 4/2) at length recommend starting dialysis. S/p LUE AVG + TDC placement 4/4 with HD 1, HD 2 4/5,  - HD 06/24/23, 4/9 and 4/11 -  on MWF schedule  - CLIP complete-  has been accepted at [facility] - OK to d/c when infectious processes ruled out-   possibly today per the patient-  she knows where to be and when on Monday   # CKD-MBD/hyperphosphatemia: Started sevelamer in the recent visit, monitor lab.  PTH 220.  Phos is great now   #Anemia of CKD: Concern about GI bleed, underwent endoscopy t recently with no sign of bleeding.  Back on Eliquis.  Hb was 6.4 and now 8s. after transfusion.  Recent iron saturation 35% but down to 15% so redosed.  S/p aranesp 100 3/30.     # Hypertension/volume: [name] improved after diuresis.  Torsemide on non-dialysis days   #A fib: on eliquis   # fever:  - cultures drawn, follow - vanc with HD and ceftriaxone --  now just keflex - VVS following to see if AVG needs to come out-- leaning towards AVG reaction-  still needing IV pain meds-  per primary changing to oral with possible discharge soon      Subjective:    HD yest-  removed 1000, tolerated well.  Still with pain in the distal arm-  pain control is holding up discharge   Objective:   BP (!) 165/66 (BP Location: Right Arm)   Pulse 87   Temp 98.8 F (37.1 C) (Oral)   Resp 18   Ht (P) 5\' 4"  (1.626 m)   Wt 72.8 kg   SpO2 97%   BMI (P) 27.55 kg/m   Intake/Output Summary (Last 24  hours) at 06/29/2023 0917 Last data filed at 06/28/2023 2130 Gross per 24 hour  Intake 100 ml  Output 1000 ml  Net -900 ml   Weight change: -1 kg  Physical Exam: General:NAD, comfortable Heart:RRR, s1s2 nl Lungs:clear b/l, no crackle Abdomen:soft, Non-tender, non-distended Extremities: minimal edema, LUE AVG +t/b, 2+ radial pulse, slightly tender over AVG-  but better  Neurology: Alert awake and following commands RIJ TDC c/d/I, L AVG + bruit, erythema , less  tenderness distally.   Imaging: No results found.  Labs: BMET Recent Labs  Lab 06/23/23 0227 06/24/23 0255 06/26/23 0219 06/28/23 0253 06/29/23 0412  NA 136 135 134* 137 135  K 3.7 4.2 3.7 4.3 4.1  CL 97* 98 98 100 98  CO2 25 23 23 23 26   GLUCOSE 98 93 139* 139* 107*  BUN 63* 83* 67* 65* 40*  CREATININE 3.88* 4.71* 4.76* 5.09* 3.30*  CALCIUM 8.2* 8.3* 8.4* 8.6* 8.8*  PHOS  --  3.9 3.1  --   --    CBC Recent Labs  Lab 06/26/23 0219 06/27/23 0748 06/28/23 0253 06/29/23 0412  WBC 11.9* 9.0 9.6 8.6  NEUTROABS 9.1*  --   --   --   HGB 7.6* 8.3*  7.6* 7.9*  HCT 24.6* 27.2* 24.8* 25.3*  MCV 93.5 92.5 93.9 92.3  PLT 186 207 197 199    Medications:     apixaban  2.5 mg Oral BID   cephALEXin  250 mg Oral Q12H   Chlorhexidine Gluconate Cloth  6 each Topical Q0600   colestipol  1 g Oral TID   FLUoxetine  20 mg Oral Daily   folic acid  1 mg Oral Daily   gabapentin  300 mg Oral QHS   insulin aspart  0-6 Units Subcutaneous TID WC   lipase/protease/amylase  36,000 Units Oral TID AC   mirtazapine  7.5 mg Oral QHS   multivitamin  1 tablet Oral QHS   pantoprazole  40 mg Oral BID   polyethylene glycol  17 g Oral Daily   rivastigmine  1.5 mg Oral BID   sevelamer carbonate  800 mg Oral TID WC   torsemide  100 mg Oral Q T,Th,S,Su   Jerald Hennington A Lakeya Mulka  06/29/2023, 9:17 AM

## 2023-06-30 DIAGNOSIS — I5033 Acute on chronic diastolic (congestive) heart failure: Secondary | ICD-10-CM | POA: Diagnosis not present

## 2023-06-30 LAB — CBC
HCT: 25.3 % — ABNORMAL LOW (ref 36.0–46.0)
Hemoglobin: 7.8 g/dL — ABNORMAL LOW (ref 12.0–15.0)
MCH: 28.7 pg (ref 26.0–34.0)
MCHC: 30.8 g/dL (ref 30.0–36.0)
MCV: 93 fL (ref 80.0–100.0)
Platelets: 211 10*3/uL (ref 150–400)
RBC: 2.72 MIL/uL — ABNORMAL LOW (ref 3.87–5.11)
RDW: 17.6 % — ABNORMAL HIGH (ref 11.5–15.5)
WBC: 10.3 10*3/uL (ref 4.0–10.5)
nRBC: 0 % (ref 0.0–0.2)

## 2023-06-30 LAB — APTT
aPTT: 43 s — ABNORMAL HIGH (ref 24–36)
aPTT: 32 s (ref 24–36)

## 2023-06-30 LAB — GLUCOSE, CAPILLARY
Glucose-Capillary: 108 mg/dL — ABNORMAL HIGH (ref 70–99)
Glucose-Capillary: 159 mg/dL — ABNORMAL HIGH (ref 70–99)
Glucose-Capillary: 126 mg/dL — ABNORMAL HIGH (ref 70–99)
Glucose-Capillary: 171 mg/dL — ABNORMAL HIGH (ref 70–99)

## 2023-06-30 LAB — CULTURE, BLOOD (ROUTINE X 2)
Culture: NO GROWTH
Culture: NO GROWTH
Special Requests: ADEQUATE
Special Requests: ADEQUATE

## 2023-06-30 LAB — BASIC METABOLIC PANEL WITH GFR
Anion gap: 11 (ref 5–15)
BUN: 64 mg/dL — ABNORMAL HIGH (ref 8–23)
CO2: 24 mmol/L (ref 22–32)
Calcium: 8.7 mg/dL — ABNORMAL LOW (ref 8.9–10.3)
Chloride: 99 mmol/L (ref 98–111)
Creatinine, Ser: 4.28 mg/dL — ABNORMAL HIGH (ref 0.44–1.00)
GFR, Estimated: 10 mL/min — ABNORMAL LOW (ref >60)
Glucose, Bld: 140 mg/dL — ABNORMAL HIGH (ref 70–99)
Potassium: 5.1 mmol/L (ref 3.5–5.1)
Sodium: 134 mmol/L — ABNORMAL LOW (ref 135–145)

## 2023-06-30 LAB — HEPARIN LEVEL (UNFRACTIONATED): Heparin Unfractionated: >1.1 [IU]/mL — ABNORMAL HIGH (ref 0.30–0.70)

## 2023-06-30 MED ORDER — DARBEPOETIN ALFA 150 MCG/0.3ML IJ SOSY
150.0000 ug | PREFILLED_SYRINGE | INTRAMUSCULAR | Status: DC
  Administered 2023-06-30 – 2023-07-07 (×2): 150 ug via SUBCUTANEOUS
  Filled 2023-06-30 (×2): qty 0.3

## 2023-06-30 MED ORDER — CHLORHEXIDINE GLUCONATE CLOTH 2 % EX PADS
6.0000 | MEDICATED_PAD | Freq: Every day | CUTANEOUS | Status: DC
  Administered 2023-06-30 – 2023-07-08 (×9): 6 via TOPICAL

## 2023-06-30 NOTE — Progress Notes (Signed)
 PHARMACY - ANTICOAGULATION CONSULT NOTE  Pharmacy Consult for heparin Indication: hx DVT and renal vein thrombus   Allergies  Allergen Reactions   Anesthetics, Halogenated Other (See Comments)    Per patient's daughter, she can not tolerate anesthetics.    Etomidate Other (See Comments)    Heart stopped during anesthesia   Fentanyl Other (See Comments)    Heart stopped during anesthesia    Percocet [Oxycodone-Acetaminophen] Other (See Comments)    Headaches    Diazepam Other (See Comments)    Agitation   Lorazepam Nausea Only   Tramadol Hcl Nausea And Vomiting   Haloperidol Nausea And Vomiting    **Haldol**   Morphine Sulfate Nausea Only    agitation   Propoxyphene Nausea And Vomiting    **Darvocet** no longer available   Versed [Midazolam] Other (See Comments)    Heart stopped during anesthesia    Patient Measurements: Height: (P) 5\' 4"  (162.6 cm) Weight: 75.5 kg (166 lb 8 oz) IBW/kg (Calculated) : (P) 54.7 HEPARIN DW (KG): (P) 71  Vital Signs: Temp: 98.6 F (37 C) (04/13 1943) Temp Source: Oral (04/13 1943) BP: 150/46 (04/13 1943) Pulse Rate: 91 (04/13 1943)  Labs: Recent Labs    06/28/23 0253 06/29/23 0412 06/30/23 0920 06/30/23 1947  HGB 7.6* 7.9* 7.8*  --   HCT 24.8* 25.3* 25.3*  --   PLT 197 199 211  --   APTT  --   --  43* 32  HEPARINUNFRC  --   --  >1.10*  --   CREATININE 5.09* 3.30* 4.28*  --     Estimated Creatinine Clearance: 11.3 mL/min (A) (by C-G formula based on SCr of 4.28 mg/dL (H)).   Medical History: Past Medical History:  Diagnosis Date   Abdominal discomfort    Chronic N/V/D. Presumptive dx Crohn's dx per elevated p ANCA. Failed Entocort and Pentasa. Sep 2003 - ileocolectomy c anastomosis per Dr Duwaine Gins 2/2 adhesions - path was hegative for Crohns. EGD, Sm bowel follow through (11/03), and an eteroclysis (10/03) were unrevealing. Cuases hypomag and hypocalcemia.   Adnexal mass 10/2001   s/p lap BSO (R ovarian fibroma) & lysis of  adhesions   Allergy    Seasonal   Anemia    Multifactorial. Baseline HgB 10-11 ish. B12 def - 150 in 3/10. Fe Def - ferritin 35 3/10. Both are being repleted.   Breast cancer (HCC) 03/16/2013   right, 5 o'clock   CAD (coronary artery disease) 1996   1996 - PTCA and angioplasty diagonal branch. 2000 - Rotoblator & angiopllasty of diagonal. 2006 - subendocardial AMI, DES to proximal LAD.Aaron Aas Also had 90% stenosis in distal apical LAD. EF 55 with apical hypokinesis. Indefinite ASA and Plavix.   CHF (congestive heart failure) (HCC)    Chronic kidney disease, stage 4 (severe) (HCC)    Chronic pain    CT 10/10 = Spinal stenosis L2 - S1.   COVID-19 11/02/2020   Diabetes mellitus    Insulin dependent   Diabetes mellitus type 2 in obese 05/03/2010   Managed on lantus and novolog. Has diabetic nephropathy. Metformin D/C'd 2012 2/2 creatinine.  No diabetic retinopathy per 3/11.    Gout    Hx of radiation therapy 06/02/13- 07/16/13   right rbeast 4500 cGy 25 sessions, right breast boost 1600 cGy in 8 sessions   Hyperlipidemia    Managed with both a statin and Welchol. Welchol stopped 2014 2/2 cost and started on fenofibrate    Hypertension    2006  B renal arteries patent. 2003 MRA - no RAS. 2003 pheo W/U Dr Francetta Innocent reportedly negative.   Hypoxia 07/23/2017   Lupus    Lymphedema of breast    Personal history of radiation therapy 2015   Pneumonia of left lung due to infectious organism    RBBB    Renal vein thrombosis (HCC)    SBO (small bowel obstruction) (HCC) 09/17/2017   Secondary hyperparathyroidism (HCC)    Stroke [facility])    Incidental finding MRI 2002 L lacunar infarct   Vitamin B12 deficiency    Vitamin D deficiency    Wears dentures    top    Medications:  Medications Prior to Admission  Medication Sig Dispense Refill Last Dose/Taking   acetaminophen (TYLENOL) 650 MG CR tablet Take 1,300 mg by mouth every 8 (eight) hours as needed for pain.   Past Week   amLODipine (NORVASC) 10 MG  tablet Take 1 tablet (10 mg total) by mouth daily. 90 tablet 3 06/15/2023 Morning   apixaban (ELIQUIS) 2.5 MG TABS tablet Take 1 tablet (2.5 mg total) by mouth 2 (two) times daily. 60 tablet 0 06/15/2023 at 10:15 AM   colestipol (COLESTID) 1 g tablet Take 1 tablet (1 g total) by mouth 3 (three) times daily. 90 tablet 2 06/15/2023 Noon   diphenoxylate-atropine (LOMOTIL) 2.5-0.025 MG tablet Take 1 tablet by mouth 2 (two) times daily as needed for diarrhea or loose stools. 30 tablet 1 Past Month   famotidine (PEPCID) 20 MG tablet Take 20 mg by mouth daily as needed for heartburn or indigestion.   Past Week   FLUoxetine (PROZAC) 20 MG capsule Take 20 mg by mouth daily.   06/15/2023 Morning   folic acid (FOLVITE) 1 MG tablet Take 1 tablet (1 mg total) by mouth daily. 30 tablet 2 06/15/2023 Morning   furosemide (LASIX) 40 MG tablet Take 1 tablet (40 mg total) by mouth daily. (Patient taking differently: Take 40-80 mg by mouth See admin instructions. Take 1 tablet by mouth daily and may take an additional tablet for excess swelling) 90 tablet 2 06/15/2023 at 10:15 AM   gabapentin (NEURONTIN) 300 MG capsule Take 300 mg by mouth at bedtime.   06/14/2023 Bedtime   HYDROcodone-acetaminophen (NORCO/VICODIN) 5-325 MG tablet Take 2 tablets by mouth every 6 (six) hours as needed. 10 tablet 0 Past Week   lipase/protease/amylase (CREON) 36000 UNITS CPEP capsule Take 1 capsule (36,000 Units total) by mouth 3 (three) times daily before meals. 180 capsule 2 06/15/2023 Noon   loperamide (IMODIUM) 2 MG capsule Take 1 capsule (2 mg total) by mouth as needed for diarrhea or loose stools. 30 capsule 0 Past Month   mirtazapine (REMERON) 7.5 MG tablet Take 7.5 mg by mouth at bedtime.   06/14/2023 Bedtime   pantoprazole (PROTONIX) 40 MG tablet Take 1 tablet (40 mg total) by mouth 2 (two) times daily. (Patient taking differently: Take 40 mg by mouth daily.) 30 tablet 0 06/15/2023 Morning   rivastigmine (EXELON) 1.5 MG capsule Take 1 capsule  (1.5 mg total) by mouth 2 (two) times daily. 60 capsule 6 06/15/2023 Morning   sevelamer carbonate (RENVELA) 800 MG tablet Take 1 tablet (800 mg total) by mouth 3 (three) times daily with meals. 90 tablet 0 06/15/2023 Noon   sodium bicarbonate 650 MG tablet Take 1 tablet (650 mg total) by mouth 2 (two) times daily. 60 tablet 0 06/15/2023 Morning   tiZANidine (ZANAFLEX) 2 MG tablet Take 2 mg by mouth at bedtime as needed for  muscle spasms.   Past Week   nitroGLYCERIN (NITROSTAT) 0.4 MG SL tablet DISSOLVE 1 TABLET UNDER THE TONGUE EVERY 5 MINUTES AS  NEEDED FOR CHEST PAIN. MAX  OF 3 TABLETS IN 15 MINUTES. CALL 911 IF PAIN PERSISTS. (Patient not taking: Reported on 06/15/2023) 25 tablet 0 Not Taking   Scheduled:   cephALEXin  250 mg Oral Q12H   Chlorhexidine Gluconate Cloth  6 each Topical Q0600   Chlorhexidine Gluconate Cloth  6 each Topical Q0600   colestipol  1 g Oral TID   darbepoetin (ARANESP) injection - DIALYSIS  150 mcg Subcutaneous Q Sun-1800   FLUoxetine  20 mg Oral Daily   folic acid  1 mg Oral Daily   gabapentin  300 mg Oral QHS   insulin aspart  0-6 Units Subcutaneous TID WC   lipase/protease/amylase  36,000 Units Oral TID AC   mirtazapine  7.5 mg Oral QHS   multivitamin  1 tablet Oral QHS   pantoprazole  40 mg Oral BID   polyethylene glycol  17 g Oral Daily   rivastigmine  1.5 mg Oral BID   sevelamer carbonate  800 mg Oral TID WC   torsemide  100 mg Oral Q T,Th,S,Su    Assessment: 78 yo female on apixaban for hx DVT and renal vein thrombus. Plans are for left arm AV graft excision next week. Pharmacy consulted to dose heparin.  -Last apixaban dose given 4/12 ~ 10am   aPTT 43, subtherapeutic with heparin infusion at 1150 units/hour. Heparin level >1.1 as expected with recent DOAC administration. Hgb 7.8, plt 211 stable. No signs/symptoms of bleeding or issues with infusion noted.   PM: aPTT 32, subtherapeutic and decreased despite rate increase to 1300 units/hr. Per discussion  with night RN, patient's IV site was changed on first shift, but time unknown and duration that heparin may have been paused also unknown. No bleeding reported.   Goal of Therapy:  Heparin level 0.3-0.7 units/ml aPTT 66-102 seconds Monitor platelets by anticoagulation protocol: Yes   Plan:  -Increase heparin infusion to 1500 units/hr -Check aPTT/HL in 8 hrs -Monitor CBC and for signs/symptoms of bleeding  Armanda Bern, PharmD, BCPS 06/30/2023 8:41 PM  Please check AMION for all [facility] phone numbers After 10:00 PM, call Main Pharmacy [phone]

## 2023-06-30 NOTE — Plan of Care (Signed)
  Problem: Coping: Goal: Ability to adjust to condition or change in health will improve Outcome: Progressing   Problem: Health Behavior/Discharge Planning: Goal: Ability to manage health-related needs will improve Outcome: Progressing   Problem: Health Behavior/Discharge Planning: Goal: Ability to identify and utilize available resources and services will improve Outcome: Progressing

## 2023-06-30 NOTE — Progress Notes (Addendum)
 PHARMACY - ANTICOAGULATION CONSULT NOTE  Pharmacy Consult for heparin Indication: hx DVT and renal vein thrombus   Allergies  Allergen Reactions   Anesthetics, Halogenated Other (See Comments)    Per patient's daughter, she can not tolerate anesthetics.    Etomidate Other (See Comments)    Heart stopped during anesthesia   Fentanyl Other (See Comments)    Heart stopped during anesthesia    Percocet [Oxycodone-Acetaminophen] Other (See Comments)    Headaches    Diazepam Other (See Comments)    Agitation   Lorazepam Nausea Only   Tramadol Hcl Nausea And Vomiting   Haloperidol Nausea And Vomiting    **Haldol**   Morphine Sulfate Nausea Only    agitation   Propoxyphene Nausea And Vomiting    **Darvocet** no longer available   Versed [Midazolam] Other (See Comments)    Heart stopped during anesthesia    Patient Measurements: Height: (P) 5\' 4"  (162.6 cm) Weight: 75.5 kg (166 lb 8 oz) IBW/kg (Calculated) : (P) 54.7 HEPARIN DW (KG): (P) 71  Vital Signs: Temp: 98.4 F (36.9 C) (04/13 0724) Temp Source: Oral (04/13 0724) BP: 122/51 (04/13 0724) Pulse Rate: 78 (04/13 0724)  Labs: Recent Labs    06/28/23 0253 06/29/23 0412 06/30/23 0920  HGB 7.6* 7.9* 7.8*  HCT 24.8* 25.3* 25.3*  PLT 197 199 211  CREATININE 5.09* 3.30*  --     Estimated Creatinine Clearance: 14.6 mL/min (A) (by C-G formula based on SCr of 3.3 mg/dL (H)).   Medical History: Past Medical History:  Diagnosis Date   Abdominal discomfort    Chronic N/V/D. Presumptive dx Crohn's dx per elevated p ANCA. Failed Entocort and Pentasa. Sep 2003 - ileocolectomy c anastomosis per Dr Duwaine Gins 2/2 adhesions - path was hegative for Crohns. EGD, Sm bowel follow through (11/03), and an eteroclysis (10/03) were unrevealing. Cuases hypomag and hypocalcemia.   Adnexal mass 10/2001   s/p lap BSO (R ovarian fibroma) & lysis of adhesions   Allergy    Seasonal   Anemia    Multifactorial. Baseline HgB 10-11 ish. B12 def  - 150 in 3/10. Fe Def - ferritin 35 3/10. Both are being repleted.   Breast cancer (HCC) 03/16/2013   right, 5 o'clock   CAD (coronary artery disease) 1996   1996 - PTCA and angioplasty diagonal branch. 2000 - Rotoblator & angiopllasty of diagonal. 2006 - subendocardial AMI, DES to proximal LAD.Aaron Aas Also had 90% stenosis in distal apical LAD. EF 55 with apical hypokinesis. Indefinite ASA and Plavix.   CHF (congestive heart failure) (HCC)    Chronic kidney disease, stage 4 (severe) (HCC)    Chronic pain    CT 10/10 = Spinal stenosis L2 - S1.   COVID-19 11/02/2020   Diabetes mellitus    Insulin dependent   Diabetes mellitus type 2 in obese 05/03/2010   Managed on lantus and novolog. Has diabetic nephropathy. Metformin D/C'd 2012 2/2 creatinine.  No diabetic retinopathy per 3/11.    Gout    Hx of radiation therapy 06/02/13- 07/16/13   right rbeast 4500 cGy 25 sessions, right breast boost 1600 cGy in 8 sessions   Hyperlipidemia    Managed with both a statin and Welchol. Welchol stopped 2014 2/2 cost and started on fenofibrate    Hypertension    2006 B renal arteries patent. 2003 MRA - no RAS. 2003 pheo W/U Dr Francetta Innocent reportedly negative.   Hypoxia 07/23/2017   Lupus    Lymphedema of breast    Personal history  of radiation therapy 2015   Pneumonia of left lung due to infectious organism    RBBB    Renal vein thrombosis (HCC)    SBO (small bowel obstruction) (HCC) 09/17/2017   Secondary hyperparathyroidism (HCC)    Stroke [facility])    Incidental finding MRI 2002 L lacunar infarct   Vitamin B12 deficiency    Vitamin D deficiency    Wears dentures    top    Medications:  Medications Prior to Admission  Medication Sig Dispense Refill Last Dose/Taking   acetaminophen (TYLENOL) 650 MG CR tablet Take 1,300 mg by mouth every 8 (eight) hours as needed for pain.   Past Week   amLODipine (NORVASC) 10 MG tablet Take 1 tablet (10 mg total) by mouth daily. 90 tablet 3 06/15/2023 Morning   apixaban  (ELIQUIS) 2.5 MG TABS tablet Take 1 tablet (2.5 mg total) by mouth 2 (two) times daily. 60 tablet 0 06/15/2023 at 10:15 AM   colestipol (COLESTID) 1 g tablet Take 1 tablet (1 g total) by mouth 3 (three) times daily. 90 tablet 2 06/15/2023 Noon   diphenoxylate-atropine (LOMOTIL) 2.5-0.025 MG tablet Take 1 tablet by mouth 2 (two) times daily as needed for diarrhea or loose stools. 30 tablet 1 Past Month   famotidine (PEPCID) 20 MG tablet Take 20 mg by mouth daily as needed for heartburn or indigestion.   Past Week   FLUoxetine (PROZAC) 20 MG capsule Take 20 mg by mouth daily.   06/15/2023 Morning   folic acid (FOLVITE) 1 MG tablet Take 1 tablet (1 mg total) by mouth daily. 30 tablet 2 06/15/2023 Morning   furosemide (LASIX) 40 MG tablet Take 1 tablet (40 mg total) by mouth daily. (Patient taking differently: Take 40-80 mg by mouth See admin instructions. Take 1 tablet by mouth daily and may take an additional tablet for excess swelling) 90 tablet 2 06/15/2023 at 10:15 AM   gabapentin (NEURONTIN) 300 MG capsule Take 300 mg by mouth at bedtime.   06/14/2023 Bedtime   HYDROcodone-acetaminophen (NORCO/VICODIN) 5-325 MG tablet Take 2 tablets by mouth every 6 (six) hours as needed. 10 tablet 0 Past Week   lipase/protease/amylase (CREON) 36000 UNITS CPEP capsule Take 1 capsule (36,000 Units total) by mouth 3 (three) times daily before meals. 180 capsule 2 06/15/2023 Noon   loperamide (IMODIUM) 2 MG capsule Take 1 capsule (2 mg total) by mouth as needed for diarrhea or loose stools. 30 capsule 0 Past Month   mirtazapine (REMERON) 7.5 MG tablet Take 7.5 mg by mouth at bedtime.   06/14/2023 Bedtime   pantoprazole (PROTONIX) 40 MG tablet Take 1 tablet (40 mg total) by mouth 2 (two) times daily. (Patient taking differently: Take 40 mg by mouth daily.) 30 tablet 0 06/15/2023 Morning   rivastigmine (EXELON) 1.5 MG capsule Take 1 capsule (1.5 mg total) by mouth 2 (two) times daily. 60 capsule 6 06/15/2023 Morning   sevelamer  carbonate (RENVELA) 800 MG tablet Take 1 tablet (800 mg total) by mouth 3 (three) times daily with meals. 90 tablet 0 06/15/2023 Noon   sodium bicarbonate 650 MG tablet Take 1 tablet (650 mg total) by mouth 2 (two) times daily. 60 tablet 0 06/15/2023 Morning   tiZANidine (ZANAFLEX) 2 MG tablet Take 2 mg by mouth at bedtime as needed for muscle spasms.   Past Week   nitroGLYCERIN (NITROSTAT) 0.4 MG SL tablet DISSOLVE 1 TABLET UNDER THE TONGUE EVERY 5 MINUTES AS  NEEDED FOR CHEST PAIN. MAX  OF 3 TABLETS  IN 15 MINUTES. CALL 911 IF PAIN PERSISTS. (Patient not taking: Reported on 06/15/2023) 25 tablet 0 Not Taking   Scheduled:   cephALEXin  250 mg Oral Q12H   Chlorhexidine Gluconate Cloth  6 each Topical Q0600   colestipol  1 g Oral TID   darbepoetin (ARANESP) injection - DIALYSIS  150 mcg Subcutaneous Q Sun-1800   FLUoxetine  20 mg Oral Daily   folic acid  1 mg Oral Daily   gabapentin  300 mg Oral QHS   insulin aspart  0-6 Units Subcutaneous TID WC   lipase/protease/amylase  36,000 Units Oral TID AC   mirtazapine  7.5 mg Oral QHS   multivitamin  1 tablet Oral QHS   pantoprazole  40 mg Oral BID   polyethylene glycol  17 g Oral Daily   rivastigmine  1.5 mg Oral BID   sevelamer carbonate  800 mg Oral TID WC   torsemide  100 mg Oral Q T,Th,S,Su    Assessment: 78 yo female on apixaban for hx DVT and renal vein thrombus. Plans are for left arm AV graft excision next week. Pharmacy consulted to dose heparin.  -Last apixaban dose given 4/12 ~ 10am   aPTT 43, subtherapeutic with heparin infusion at 1150 units/hour. Heparin level >1.1 as expected with recent DOAC administration. Hgb 7.8, plt 211 stable. No signs/symptoms of bleeding or issues with infusion noted.    Goal of Therapy:  Heparin level 0.3-0.7 units/ml aPTT 66-102 seconds Monitor platelets by anticoagulation protocol: Yes   Plan:  -Increase heparin infusion to 1300 units/hr -Check aPTT in 8 hrs -Monitor CBC and for signs/symptoms  of bleeding  Jerrel Mor, PharmD PGY1 Pharmacy Resident 06/30/2023 10:10 AM

## 2023-06-30 NOTE — Progress Notes (Signed)
 [facility] Progress Note   Assessment/ Plan:   Pt is a 78 y.o.  with  hypertension, renal vein thrombosis on Eliquis, stroke, type II DM, lupus, CKD 4-5 who was recently admitted for CHF exacerbation and progressive CKD presented back with worsening dyspnea on exertion and fluid overload.    # Progressive CKD 5 versus AKI on CKD,: With frequent hospitalization for fluid overload.  Responding to IV diuretics but not po and with worsening renal function d/w patient and family (Daughter on phone 4/2) at length recommend starting dialysis. S/p LUE AVG + TDC placement 4/4 with HD 1, HD 2 4/5,  - HD 06/24/23, 4/9 and 4/11 -  on MWF schedule  - CLIP complete-  has been accepted at [facility] -  now with complication of AVG, possible excision needed -  will do HD tomorrow here   # CKD-MBD/hyperphosphatemia: Started low dose sevelamer in the recent visit, monitor lab.  PTH 220.  Phos is great now   #Anemia of CKD: Concern about GI bleed, underwent endoscopy t recently with no sign of bleeding.  Back on Eliquis.  Hb was 6.4 and now 8s. after transfusion.  Giving iron and ESA   # Hypertension/volume: [name] improved after diuresis.  Torsemide on non-dialysis days   #A fib: on eliquis   # fever:  - cultures drawn, follow - vanc with HD and ceftriaxone --  now just keflex - VVS following to see if AVG needs to come out--   still needing IV pain meds-  now concern is for steal       Subjective:    Continues to have left upper extremity sxms-  now vascular has concern over steal-  planning for AVF excision possibly tomorrow -  patient seems confused regarding plans   Objective:   BP (!) 122/51 (BP Location: Right Arm)   Pulse 78   Temp 98.4 F (36.9 C) (Oral)   Resp 15   Ht (P) 5\' 4"  (1.626 m)   Wt 75.5 kg   SpO2 95%   BMI (P) 28.58 kg/m   Intake/Output Summary (Last 24 hours) at 06/30/2023 1002 Last data filed at 06/30/2023 0616 Gross per 24 hour  Intake --  Output 100 ml   Net -100 ml   Weight change: -3.476 kg  Physical Exam: General:NAD, comfortable Heart:RRR, s1s2 nl Lungs:clear b/l, no crackle Abdomen:soft, Non-tender, non-distended Extremities: minimal edema, LUE AVG +t/b, 2+ radial pulse, slightly tender over AVG-  but better  Neurology: Alert awake and following commands RIJ TDC c/d/I, L AVG + bruit, erythema , less  tenderness distally.   Imaging: No results found.  Labs: BMET Recent Labs  Lab 06/24/23 0255 06/26/23 0219 06/28/23 0253 06/29/23 0412  NA 135 134* 137 135  K 4.2 3.7 4.3 4.1  CL 98 98 100 98  CO2 23 23 23 26   GLUCOSE 93 139* 139* 107*  BUN 83* 67* 65* 40*  CREATININE 4.71* 4.76* 5.09* 3.30*  CALCIUM 8.3* 8.4* 8.6* 8.8*  PHOS 3.9 3.1  --   --    CBC Recent Labs  Lab 06/26/23 0219 06/27/23 0748 06/28/23 0253 06/29/23 0412  WBC 11.9* 9.0 9.6 8.6  NEUTROABS 9.1*  --   --   --   HGB 7.6* 8.3* 7.6* 7.9*  HCT 24.6* 27.2* 24.8* 25.3*  MCV 93.5 92.5 93.9 92.3  PLT 186 207 197 199    Medications:     cephALEXin  250 mg Oral Q12H  Chlorhexidine Gluconate Cloth  6 each Topical Q0600   colestipol  1 g Oral TID   FLUoxetine  20 mg Oral Daily   folic acid  1 mg Oral Daily   gabapentin  300 mg Oral QHS   insulin aspart  0-6 Units Subcutaneous TID WC   lipase/protease/amylase  36,000 Units Oral TID AC   mirtazapine  7.5 mg Oral QHS   multivitamin  1 tablet Oral QHS   pantoprazole  40 mg Oral BID   polyethylene glycol  17 g Oral Daily   rivastigmine  1.5 mg Oral BID   sevelamer carbonate  800 mg Oral TID WC   torsemide  100 mg Oral Q T,Th,S,Su   Shawnika Pepin A Luceal Hollibaugh  06/30/2023, 10:02 AM

## 2023-06-30 NOTE — Progress Notes (Signed)
 PROGRESS NOTE    [name]  ZOX:[id number] DOB: 02/21/1946 DOA: 06/15/2023 PCP: Merl Star, MD  77/F with CAD, chronic diastolic CHF, CKD 4, history of DVT, renal vein thrombosis on Eliquis, CVA, depression, memory loss, type 2 diabetes mellitus, history of breast cancer, lupus was admitted 3/8-15 with volume overload, progressive CKD, followed by nephrology, diuresed and discharged home on oral diuretics, also seen by gastroenterology for worsening chronic anemia, EGD and colonoscopy were unremarkable, Eliquis resumed at discharge. -Back in the ED 3/29 with progressive dyspnea on exertion orthopnea lower extremity edema and weight gain. -In the ED hypertensive, otherwise vital signs stable, hemoglobin 7.0, creatinine 4.0, BUN 82, BNP 560, troponin 125, chest x-ray unremarkable -Admitted, started on IV diuretics, nephrology following -4/2, vascular surgery consult, plan to start HD -4/4: tunneled HD cath placed, left arm AV graft -4/7: low BP, DC held, ? Infection at AVG site -4/8: started Abx -4/9-10 onwards, persistent severe left arm pain -4/12 vascular surgery consulted, plan for AV graft excision  Subjective: -Continues to have left arm pain  Assessment and Plan:  Acute on chronic HFpEF Progressive CKD stage 4/5 -Recent hospitalization for same  -Echo 3/25 with EF 60-65, grade 1 DD, normal RV  -Limited response to diuretics, in the background of progressive CKD -started HD 4/4 -Nephrology following, dialysis tomorrow  Left arm AV graft pain, steal syndrome,  -Clinical suspicion for infection is low at this time -Initially treated with broad-spectrum antibiotics, blood cultures are negative, changed to oral Keflex on 4/11, discontinue antibiotics after surgery -Vascular surgery reconsulted, plan for AV graft excision Monday/Tuesday   Anemia of chronic disease, iron deficiency -Underwent GI workup few weeks ago which was unremarkable -Baseline hemoglobin 8-10, lower  this admission, transfused 1 unit PRBC 3/30 -Given IV iron and Aranesp  Left foot, gout flare -Improved, completed 3 days of prednisone   CAD status post PCI in 2000 EKG without acute ischemic changes.  Stable   Hypertension -hold amlodipine.   History of renal vein thrombosis 09/2017 and DVT 06/2020 -Was held, now on IV heparin   Depression Mild Dementia -Continue fluoxetine, rivastigmine   Type 2 diabetes A1c 6.2 on 05/08/2023.  Sensitive sliding scale   Chronic pain/neuropathy Continue gabapentin.   GERD/gastritis Continue Protonix.   DVT prophylaxis: Eliquis Code Status: Full code Family Communication: No family at bedside, updated daughter yesterday Disposition Plan: Home pending improvement in left arm, likely tomorrow  Consultants:    Procedures:   Antimicrobials:    Objective: Vitals:   06/29/23 1931 06/30/23 0105 06/30/23 0553 06/30/23 0724  BP: (!) 168/60 (!) 148/49 (!) 145/48 (!) 122/51  Pulse: 82 83 83 78  Resp: 17 16 16 15   Temp: 98.7 F (37.1 C) 98.7 F (37.1 C) 98.7 F (37.1 C) 98.4 F (36.9 C)  TempSrc: Oral Oral Oral Oral  SpO2: 98% 94% 97% 95%  Weight:   75.5 kg   Height:        Intake/Output Summary (Last 24 hours) at 06/30/2023 1053 Last data filed at 06/30/2023 0616 Gross per 24 hour  Intake --  Output 100 ml  Net -100 ml   Filed Weights   06/28/23 1230 06/29/23 0421 06/30/23 0553  Weight: 79 kg 72.8 kg 75.5 kg    Examination: Gen: Awake, Alert, Oriented X 2, mild cognitive deficits HEENT: no JVD Lungs: Good air movement bilaterally, CTAB CVS: S1S2/RRR Abd: soft, Non tender, non distended, BS present Extremities: Trace edema, Left arm AV graft with erythema and tenderness  improving Skin: As above    Data Reviewed:   CBC: Recent Labs  Lab 06/26/23 0219 06/27/23 0748 06/28/23 0253 06/29/23 0412 06/30/23 0920  WBC 11.9* 9.0 9.6 8.6 10.3  NEUTROABS 9.1*  --   --   --   --   HGB 7.6* 8.3* 7.6* 7.9* 7.8*  HCT  24.6* 27.2* 24.8* 25.3* 25.3*  MCV 93.5 92.5 93.9 92.3 93.0  PLT 186 207 197 199 211   Basic Metabolic Panel: Recent Labs  Lab 06/24/23 0255 06/26/23 0219 06/28/23 0253 06/29/23 0412 06/30/23 0920  NA 135 134* 137 135 134*  K 4.2 3.7 4.3 4.1 5.1  CL 98 98 100 98 99  CO2 23 23 23 26 24   GLUCOSE 93 139* 139* 107* 140*  BUN 83* 67* 65* 40* 64*  CREATININE 4.71* 4.76* 5.09* 3.30* 4.28*  CALCIUM 8.3* 8.4* 8.6* 8.8* 8.7*  PHOS 3.9 3.1  --   --   --    GFR: Estimated Creatinine Clearance: 11.3 mL/min (A) (by C-G formula based on SCr of 4.28 mg/dL (H)). Liver Function Tests: Recent Labs  Lab 06/24/23 0255 06/26/23 0219  ALBUMIN 2.1* 2.0*   No results for input(s): "LIPASE", "AMYLASE" in the last 168 hours. No results for input(s): "AMMONIA" in the last 168 hours. Coagulation Profile: No results for input(s): "INR", "PROTIME" in the last 168 hours. Cardiac Enzymes: No results for input(s): "CKTOTAL", "CKMB", "CKMBINDEX", "TROPONINI" in the last 168 hours. BNP (last 3 results) No results for input(s): "PROBNP" in the last 8760 hours. HbA1C: No results for input(s): "HGBA1C" in the last 72 hours. CBG: Recent Labs  Lab 06/29/23 0546 06/29/23 1203 06/29/23 1539 06/29/23 2141 06/30/23 0619  GLUCAP 88 121* 151* 193* 108*   Lipid Profile: No results for input(s): "CHOL", "HDL", "LDLCALC", "TRIG", "CHOLHDL", "LDLDIRECT" in the last 72 hours. Thyroid Function Tests: No results for input(s): "TSH", "T4TOTAL", "FREET4", "T3FREE", "THYROIDAB" in the last 72 hours. Anemia Panel: No results for input(s): "VITAMINB12", "FOLATE", "FERRITIN", "TIBC", "IRON", "RETICCTPCT" in the last 72 hours.  Urine analysis:    Component Value Date/Time   COLORURINE STRAW (A) 05/26/2023 1610   APPEARANCEUR CLEAR 05/26/2023 1610   APPEARANCEUR Clear 08/22/2018 1620   LABSPEC 1.008 05/26/2023 1610   LABSPEC 1.020 03/03/2014 1341   PHURINE 5.0 05/26/2023 1610   GLUCOSEU NEGATIVE 05/26/2023 1610    GLUCOSEU Negative 03/03/2014 1341   HGBUR SMALL (A) 05/26/2023 1610   BILIRUBINUR NEGATIVE 05/26/2023 1610   BILIRUBINUR Negative 08/22/2018 1620   BILIRUBINUR Negative 03/03/2014 1341   KETONESUR NEGATIVE 05/26/2023 1610   PROTEINUR 100 (A) 05/26/2023 1610   UROBILINOGEN 0.2 03/03/2014 1341   NITRITE NEGATIVE 05/26/2023 1610   LEUKOCYTESUR NEGATIVE 05/26/2023 1610   LEUKOCYTESUR Negative 03/03/2014 1341   Sepsis Labs: @LABRCNTIP (procalcitonin:4,lacticidven:4)  ) Recent Results (from the past 240 hours)  Culture, blood (Routine X 2) w Reflex to ID Panel     Status: None   Collection Time: 06/25/23 12:24 AM   Specimen: BLOOD RIGHT HAND  Result Value Ref Range Status   Specimen Description BLOOD RIGHT HAND  Final   Special Requests   Final    BOTTLES DRAWN AEROBIC AND ANAEROBIC Blood Culture adequate volume   Culture   Final    NO GROWTH 5 DAYS Performed at [facility], [address] [place], [place] [address]    Report Status 06/30/2023 FINAL  Final  Culture, blood (Routine X 2) w Reflex to ID Panel     Status:  None   Collection Time: 06/25/23 12:24 AM   Specimen: BLOOD RIGHT ARM  Result Value Ref Range Status   Specimen Description BLOOD RIGHT ARM  Final   Special Requests   Final    BOTTLES DRAWN AEROBIC AND ANAEROBIC Blood Culture adequate volume   Culture   Final    NO GROWTH 5 DAYS Performed at [facility], [address], [place], [place] [address]    Report Status 06/30/2023 FINAL  Final     Radiology Studies: No results found.     Scheduled Meds:  cephALEXin  250 mg Oral Q12H   Chlorhexidine Gluconate Cloth  6 each Topical Q0600   Chlorhexidine Gluconate Cloth  6 each Topical Q0600   colestipol  1 g Oral TID   darbepoetin (ARANESP) injection - DIALYSIS  150 mcg Subcutaneous Q Sun-1800   FLUoxetine  20 mg Oral Daily   folic acid  1 mg Oral Daily   gabapentin  300 mg Oral QHS   insulin aspart  0-6 Units Subcutaneous TID WC    lipase/protease/amylase  36,000 Units Oral TID AC   mirtazapine  7.5 mg Oral QHS   multivitamin  1 tablet Oral QHS   pantoprazole  40 mg Oral BID   polyethylene glycol  17 g Oral Daily   rivastigmine  1.5 mg Oral BID   sevelamer carbonate  800 mg Oral TID WC   torsemide  100 mg Oral Q T,Th,S,Su   Continuous Infusions:  heparin 1,150 Units/hr (06/29/23 2119)       LOS: 14 days    Time spent:    Deforest Fast, MD [facility]   06/30/2023, 10:53 AM

## 2023-06-30 NOTE — Plan of Care (Signed)
   Problem: Education: Goal: Ability to describe self-care measures that may prevent or decrease complications (Diabetes Survival Skills Education) will improve Outcome: Progressing Goal: Individualized Educational Video(s) Outcome: Progressing

## 2023-06-30 NOTE — Progress Notes (Signed)
 NPO midnight for fistula excision tomorrow.

## 2023-07-01 ENCOUNTER — Other Ambulatory Visit: Payer: Self-pay

## 2023-07-01 ENCOUNTER — Encounter (HOSPITAL_COMMUNITY)
Admission: EM | Disposition: A | Discharge status: Home / Self Care | Payer: Self-pay | Source: Home / Self Care | Attending: Internal Medicine

## 2023-07-01 ENCOUNTER — Encounter (HOSPITAL_COMMUNITY): Payer: Self-pay | Admitting: Internal Medicine

## 2023-07-01 ENCOUNTER — Inpatient Hospital Stay (HOSPITAL_COMMUNITY): Payer: Self-pay

## 2023-07-01 DIAGNOSIS — T82898A Other specified complication of vascular prosthetic devices, implants and grafts, initial encounter: Secondary | ICD-10-CM

## 2023-07-01 DIAGNOSIS — N185 Chronic kidney disease, stage 5: Secondary | ICD-10-CM | POA: Diagnosis not present

## 2023-07-01 DIAGNOSIS — I5033 Acute on chronic diastolic (congestive) heart failure: Secondary | ICD-10-CM | POA: Diagnosis not present

## 2023-07-01 DIAGNOSIS — E1122 Type 2 diabetes mellitus with diabetic chronic kidney disease: Secondary | ICD-10-CM

## 2023-07-01 DIAGNOSIS — N186 End stage renal disease: Secondary | ICD-10-CM | POA: Diagnosis not present

## 2023-07-01 DIAGNOSIS — G458 Other transient cerebral ischemic attacks and related syndromes: Secondary | ICD-10-CM

## 2023-07-01 DIAGNOSIS — I120 Hypertensive chronic kidney disease with stage 5 chronic kidney disease or end stage renal disease: Secondary | ICD-10-CM

## 2023-07-01 HISTORY — PX: AVGG REMOVAL: SHX5153

## 2023-07-01 HISTORY — PX: ARTERY REPAIR: SHX559

## 2023-07-01 LAB — CBC WITH DIFFERENTIAL/PLATELET
Abs Immature Granulocytes: 0.21 10*3/uL — ABNORMAL HIGH (ref 0.00–0.07)
Basophils Absolute: 0 10*3/uL (ref 0.0–0.1)
Basophils Relative: 0 %
Eosinophils Absolute: 0.1 10*3/uL (ref 0.0–0.5)
Eosinophils Relative: 1 %
HCT: 22.7 % — ABNORMAL LOW (ref 36.0–46.0)
Hemoglobin: 6.9 g/dL — CL (ref 12.0–15.0)
Immature Granulocytes: 2 %
Lymphocytes Relative: 10 %
Lymphs Abs: 1 10*3/uL (ref 0.7–4.0)
MCH: 29 pg (ref 26.0–34.0)
MCHC: 30.4 g/dL (ref 30.0–36.0)
MCV: 95.4 fL (ref 80.0–100.0)
Monocytes Absolute: 0.6 10*3/uL (ref 0.1–1.0)
Monocytes Relative: 6 %
Neutro Abs: 8.3 10*3/uL — ABNORMAL HIGH (ref 1.7–7.7)
Neutrophils Relative %: 81 %
Platelets: 231 10*3/uL (ref 150–400)
RBC: 2.38 MIL/uL — ABNORMAL LOW (ref 3.87–5.11)
RDW: 17.8 % — ABNORMAL HIGH (ref 11.5–15.5)
WBC: 10.2 10*3/uL (ref 4.0–10.5)
nRBC: 0 % (ref 0.0–0.2)

## 2023-07-01 LAB — BASIC METABOLIC PANEL WITH GFR
Anion gap: 13 (ref 5–15)
BUN: 83 mg/dL — ABNORMAL HIGH (ref 8–23)
CO2: 24 mmol/L (ref 22–32)
Calcium: 8.9 mg/dL (ref 8.9–10.3)
Chloride: 99 mmol/L (ref 98–111)
Creatinine, Ser: 4.99 mg/dL — ABNORMAL HIGH (ref 0.44–1.00)
GFR, Estimated: 9 mL/min — ABNORMAL LOW (ref >60)
Glucose, Bld: 87 mg/dL (ref 70–99)
Potassium: 5.3 mmol/L — ABNORMAL HIGH (ref 3.5–5.1)
Sodium: 136 mmol/L (ref 135–145)

## 2023-07-01 LAB — RENAL FUNCTION PANEL
Albumin: 2.1 g/dL — ABNORMAL LOW (ref 3.5–5.0)
Anion gap: 11 (ref 5–15)
BUN: 85 mg/dL — ABNORMAL HIGH (ref 8–23)
CO2: 22 mmol/L (ref 22–32)
Calcium: 8.7 mg/dL — ABNORMAL LOW (ref 8.9–10.3)
Chloride: 101 mmol/L (ref 98–111)
Creatinine, Ser: 5.37 mg/dL — ABNORMAL HIGH (ref 0.44–1.00)
GFR, Estimated: 8 mL/min — ABNORMAL LOW (ref >60)
Glucose, Bld: 162 mg/dL — ABNORMAL HIGH (ref 70–99)
Phosphorus: 5.1 mg/dL — ABNORMAL HIGH (ref 2.5–4.6)
Potassium: 5.4 mmol/L — ABNORMAL HIGH (ref 3.5–5.1)
Sodium: 134 mmol/L — ABNORMAL LOW (ref 135–145)

## 2023-07-01 LAB — CBC
HCT: 23 % — ABNORMAL LOW (ref 36.0–46.0)
Hemoglobin: 7.2 g/dL — ABNORMAL LOW (ref 12.0–15.0)
MCH: 28.9 pg (ref 26.0–34.0)
MCHC: 31.3 g/dL (ref 30.0–36.0)
MCV: 92.4 fL (ref 80.0–100.0)
Platelets: 223 10*3/uL (ref 150–400)
RBC: 2.49 MIL/uL — ABNORMAL LOW (ref 3.87–5.11)
RDW: 17.5 % — ABNORMAL HIGH (ref 11.5–15.5)
WBC: 11.2 10*3/uL — ABNORMAL HIGH (ref 4.0–10.5)
nRBC: 0.2 % (ref 0.0–0.2)

## 2023-07-01 LAB — GLUCOSE, CAPILLARY
Glucose-Capillary: 91 mg/dL (ref 70–99)
Glucose-Capillary: 82 mg/dL (ref 70–99)
Glucose-Capillary: 83 mg/dL (ref 70–99)
Glucose-Capillary: 94 mg/dL (ref 70–99)
Glucose-Capillary: 167 mg/dL — ABNORMAL HIGH (ref 70–99)

## 2023-07-01 LAB — HEPARIN LEVEL (UNFRACTIONATED): Heparin Unfractionated: >1.1 [IU]/mL — ABNORMAL HIGH (ref 0.30–0.70)

## 2023-07-01 LAB — APTT: aPTT: 62 s — ABNORMAL HIGH (ref 24–36)

## 2023-07-01 SURGERY — REMOVAL OF ARTERIOVENOUS GORETEX GRAFT (AVGG)
Anesthesia: General | Site: Arm Upper | Laterality: Left

## 2023-07-01 MED ORDER — DEXAMETHASONE SODIUM PHOSPHATE 10 MG/ML IJ SOLN
INTRAMUSCULAR | Status: AC
  Filled 2023-07-01: qty 1

## 2023-07-01 MED ORDER — HYDROMORPHONE HCL 1 MG/ML IJ SOLN
INTRAMUSCULAR | Status: AC
Start: 2023-07-01 — End: ?
  Filled 2023-07-01: qty 0.5

## 2023-07-01 MED ORDER — PROPOFOL 1000 MG/100ML IV EMUL
INTRAVENOUS | Status: AC
  Filled 2023-07-01: qty 200

## 2023-07-01 MED ORDER — HYDROCODONE-ACETAMINOPHEN 5-325 MG PO TABS
1.0000 | ORAL_TABLET | ORAL | Status: DC | PRN
  Administered 2023-07-03: 1 via ORAL
  Administered 2023-07-04 – 2023-07-05 (×2): 2 via ORAL
  Filled 2023-07-01: qty 1
  Filled 2023-07-01 (×2): qty 2

## 2023-07-01 MED ORDER — SODIUM CHLORIDE 0.9 % IV SOLN: INTRAVENOUS | Status: DC

## 2023-07-01 MED ORDER — CHLORHEXIDINE GLUCONATE 0.12 % MT SOLN
OROMUCOSAL | Status: AC
Start: 2023-07-01 — End: 2023-07-01
  Administered 2023-07-01: 15 mL via OROMUCOSAL
  Filled 2023-07-01: qty 15

## 2023-07-01 MED ORDER — 0.9 % SODIUM CHLORIDE (POUR BTL) OPTIME
TOPICAL | Status: DC | PRN
  Administered 2023-07-01: 1000 mL

## 2023-07-01 MED ORDER — HEPARIN SODIUM (PORCINE) 1000 UNIT/ML IJ SOLN
INTRAMUSCULAR | Status: DC | PRN
Start: 2023-07-01 — End: 2023-07-01
  Administered 2023-07-01: 500 [IU] via INTRAVENOUS

## 2023-07-01 MED ORDER — FENTANYL CITRATE (PF) 100 MCG/2ML IJ SOLN
INTRAMUSCULAR | Status: AC
Start: 2023-07-01 — End: 2023-07-02
  Filled 2023-07-01: qty 2

## 2023-07-01 MED ORDER — PROPOFOL 10 MG/ML IV BOLUS
INTRAVENOUS | Status: AC
  Filled 2023-07-01: qty 20

## 2023-07-01 MED ORDER — HEPARIN (PORCINE) 25000 UT/250ML-% IV SOLN
1700.0000 [IU]/h | INTRAVENOUS | Status: DC
  Administered 2023-07-02 – 2023-07-05 (×5): 1650 [IU]/h via INTRAVENOUS
  Filled 2023-07-01 (×5): qty 250

## 2023-07-01 MED ORDER — LIDOCAINE 2% (20 MG/ML) 5 ML SYRINGE
INTRAMUSCULAR | Status: DC | PRN
  Administered 2023-07-01: 40 mg via INTRAVENOUS

## 2023-07-01 MED ORDER — HEPARIN SODIUM (PORCINE) 1000 UNIT/ML IJ SOLN
INTRAMUSCULAR | Status: AC
  Filled 2023-07-01: qty 2

## 2023-07-01 MED ORDER — ONDANSETRON HCL 4 MG/2ML IJ SOLN: 4.0000 mg | Freq: Four times a day (QID) | INTRAMUSCULAR | Status: DC | PRN

## 2023-07-01 MED ORDER — CEFAZOLIN SODIUM 1 G IJ SOLR
INTRAMUSCULAR | Status: AC
  Filled 2023-07-01: qty 20

## 2023-07-01 MED ORDER — CHLORHEXIDINE GLUCONATE 0.12 % MT SOLN: 15.0000 mL | Freq: Once | OROMUCOSAL | Status: AC

## 2023-07-01 MED ORDER — PHENYLEPHRINE HCL-NACL 20-0.9 MG/250ML-% IV SOLN
INTRAVENOUS | Status: AC
  Filled 2023-07-01: qty 500

## 2023-07-01 MED ORDER — SODIUM CHLORIDE 0.9% FLUSH: 10.0000 mL | Freq: Two times a day (BID) | INTRAVENOUS | Status: DC

## 2023-07-01 MED ORDER — PHENYLEPHRINE 80 MCG/ML (10ML) SYRINGE FOR IV PUSH (FOR BLOOD PRESSURE SUPPORT)
PREFILLED_SYRINGE | INTRAVENOUS | Status: DC | PRN
  Administered 2023-07-01: 80 ug via INTRAVENOUS

## 2023-07-01 MED ORDER — LIDOCAINE 2% (20 MG/ML) 5 ML SYRINGE
INTRAMUSCULAR | Status: AC
  Filled 2023-07-01: qty 5

## 2023-07-01 MED ORDER — ONDANSETRON HCL 4 MG/2ML IJ SOLN
INTRAMUSCULAR | Status: DC | PRN
  Administered 2023-07-01: 4 mg via INTRAVENOUS

## 2023-07-01 MED ORDER — ROCURONIUM BROMIDE 10 MG/ML (PF) SYRINGE
PREFILLED_SYRINGE | INTRAVENOUS | Status: AC
  Filled 2023-07-01: qty 10

## 2023-07-01 MED ORDER — CEFAZOLIN SODIUM-DEXTROSE 2-3 GM-%(50ML) IV SOLR
INTRAVENOUS | Status: DC | PRN
  Administered 2023-07-01: 2 g via INTRAVENOUS

## 2023-07-01 MED ORDER — HEPARIN SODIUM (PORCINE) 1000 UNIT/ML IJ SOLN
2000.0000 [IU] | Freq: Once | INTRAMUSCULAR | Status: AC
  Administered 2023-07-01: 2000 [IU]

## 2023-07-01 MED ORDER — ONDANSETRON HCL 4 MG/2ML IJ SOLN
INTRAMUSCULAR | Status: AC
  Filled 2023-07-01: qty 2

## 2023-07-01 MED ORDER — LIDOCAINE HCL (PF) 1 % IJ SOLN
INTRAMUSCULAR | Status: AC
  Filled 2023-07-01: qty 30

## 2023-07-01 MED ORDER — LIDOCAINE-EPINEPHRINE (PF) 1 %-1:200000 IJ SOLN
INTRAMUSCULAR | Status: AC
  Filled 2023-07-01: qty 30

## 2023-07-01 MED ORDER — PROPOFOL 500 MG/50ML IV EMUL
INTRAVENOUS | Status: DC | PRN
  Administered 2023-07-01: 125 ug/kg/min via INTRAVENOUS

## 2023-07-01 MED ORDER — HEPARIN 6000 UNIT IRRIGATION SOLUTION
Status: AC
  Filled 2023-07-01: qty 500

## 2023-07-01 MED ORDER — LACTATED RINGERS IV SOLN: INTRAVENOUS | Status: DC

## 2023-07-01 MED ORDER — ORAL CARE MOUTH RINSE: 15.0000 mL | Freq: Once | OROMUCOSAL | Status: AC

## 2023-07-01 MED ORDER — FENTANYL CITRATE (PF) 100 MCG/2ML IJ SOLN
25.0000 ug | INTRAMUSCULAR | Status: DC | PRN
  Administered 2023-07-01: 25 ug via INTRAVENOUS
  Administered 2023-07-01: 50 ug via INTRAVENOUS

## 2023-07-01 MED ORDER — PROPOFOL 10 MG/ML IV BOLUS
INTRAVENOUS | Status: DC | PRN
  Administered 2023-07-01: 100 mg via INTRAVENOUS

## 2023-07-01 MED ORDER — HEPARIN 6000 UNIT IRRIGATION SOLUTION
Status: DC | PRN
  Administered 2023-07-01: 1

## 2023-07-01 MED ORDER — SODIUM CHLORIDE 0.9% FLUSH: 10.0000 mL | INTRAVENOUS | Status: DC | PRN

## 2023-07-01 SURGICAL SUPPLY — 34 items
APPLIER CLIP 9.375 SM OPEN (CLIP) ×2 IMPLANT
ARMBAND PINK RESTRICT EXTREMIT (MISCELLANEOUS) ×3 IMPLANT
BAG COUNTER SPONGE SURGICOUNT (BAG) ×3 IMPLANT
BNDG ELASTIC 4INX 5YD STR LF (GAUZE/BANDAGES/DRESSINGS) IMPLANT
BNDG ELASTIC 6X15 VLCR STRL LF (GAUZE/BANDAGES/DRESSINGS) IMPLANT
BNDG GAUZE DERMACEA FLUFF 4 (GAUZE/BANDAGES/DRESSINGS) IMPLANT
CANISTER SUCT 3000ML PPV (MISCELLANEOUS) ×3 IMPLANT
CLIP APPLIE 9.375 SM OPEN (CLIP) ×3 IMPLANT
CLIP TI MEDIUM 24 (CLIP) ×3 IMPLANT
CLIP TI MEDIUM 6 (CLIP) ×6 IMPLANT
CLIP TI WIDE RED SMALL 24 (CLIP) ×3 IMPLANT
CLIP TI WIDE RED SMALL 6 (CLIP) ×3 IMPLANT
DERMABOND ADVANCED .7 DNX12 (GAUZE/BANDAGES/DRESSINGS) ×3 IMPLANT
ELECT REM PT RETURN 9FT ADLT (ELECTROSURGICAL) ×2 IMPLANT
ELECTRODE REM PT RTRN 9FT ADLT (ELECTROSURGICAL) ×3 IMPLANT
GLOVE BIOGEL PI IND STRL 8 (GLOVE) ×3 IMPLANT
GOWN STRL REUS W/ TWL LRG LVL3 (GOWN DISPOSABLE) ×6 IMPLANT
GOWN STRL REUS W/TWL 2XL LVL3 (GOWN DISPOSABLE) ×6 IMPLANT
KIT BASIN OR (CUSTOM PROCEDURE TRAY) ×3 IMPLANT
KIT TURNOVER KIT B (KITS) ×3 IMPLANT
LOOP VESSEL MINI RED (MISCELLANEOUS) IMPLANT
NDL HYPO 25GX1X1/2 BEV (NEEDLE) ×3 IMPLANT
NEEDLE HYPO 25GX1X1/2 BEV (NEEDLE) ×2 IMPLANT
NS IRRIG 1000ML POUR BTL (IV SOLUTION) ×3 IMPLANT
PACK CV ACCESS (CUSTOM PROCEDURE TRAY) ×3 IMPLANT
PAD ARMBOARD POSITIONER FOAM (MISCELLANEOUS) ×6 IMPLANT
SPIKE FLUID TRANSFER (MISCELLANEOUS) IMPLANT
SUT MNCRL AB 4-0 PS2 18 (SUTURE) IMPLANT
SUT PROLENE 5 0 C 1 24 (SUTURE) IMPLANT
SUT PROLENE 6 0 BV (SUTURE) ×3 IMPLANT
SUT VIC AB 3-0 SH 27X BRD (SUTURE) ×3 IMPLANT
TOWEL GREEN STERILE (TOWEL DISPOSABLE) ×3 IMPLANT
UNDERPAD 30X36 HEAVY ABSORB (UNDERPADS AND DIAPERS) ×3 IMPLANT
WATER STERILE IRR 1000ML POUR (IV SOLUTION) ×3 IMPLANT

## 2023-07-01 NOTE — Progress Notes (Signed)
 PHARMACY - ANTICOAGULATION CONSULT NOTE  Pharmacy Consult for heparin Indication: hx DVT and renal vein thrombus   Allergies  Allergen Reactions   Anesthetics, Halogenated Other (See Comments)    Per patient's daughter, she can not tolerate anesthetics.    Etomidate Other (See Comments)    Heart stopped during anesthesia   Fentanyl Other (See Comments)    Heart stopped during anesthesia    Percocet [Oxycodone-Acetaminophen] Other (See Comments)    Headaches    Diazepam Other (See Comments)    Agitation   Lorazepam Nausea Only   Tramadol Hcl Nausea And Vomiting   Haloperidol Nausea And Vomiting    **Haldol**   Morphine Sulfate Nausea Only    agitation   Propoxyphene Nausea And Vomiting    **Darvocet** no longer available   Versed [Midazolam] Other (See Comments)    Heart stopped during anesthesia    Patient Measurements: Height: (P) 5\' 4"  (162.6 cm) Weight: 76.7 kg (169 lb 1.5 oz) IBW/kg (Calculated) : (P) 54.7 HEPARIN DW (KG): (P) 71  Vital Signs: Temp: 99.3 F (37.4 C) (04/14 0916) Temp Source: Oral (04/14 0916) BP: 137/46 (04/14 0916) Pulse Rate: 86 (04/14 0916)  Labs: Recent Labs    06/29/23 0412 06/30/23 0920 06/30/23 1947 07/01/23 0824  HGB 7.9* 7.8*  --  7.2*  HCT 25.3* 25.3*  --  23.0*  PLT 199 211  --  223  APTT  --  43* 32 62*  HEPARINUNFRC  --  >1.10*  --  >1.10*  CREATININE 3.30* 4.28*  --  4.99*    Estimated Creatinine Clearance: 9.8 mL/min (A) (by C-G formula based on SCr of 4.99 mg/dL (H)).   Medical History: Past Medical History:  Diagnosis Date   Abdominal discomfort    Chronic N/V/D. Presumptive dx Crohn's dx per elevated p ANCA. Failed Entocort and Pentasa. Sep 2003 - ileocolectomy c anastomosis per Dr Duwaine Gins 2/2 adhesions - path was hegative for Crohns. EGD, Sm bowel follow through (11/03), and an eteroclysis (10/03) were unrevealing. Cuases hypomag and hypocalcemia.   Adnexal mass 10/2001   s/p lap BSO (R ovarian fibroma) & lysis  of adhesions   Allergy    Seasonal   Anemia    Multifactorial. Baseline HgB 10-11 ish. B12 def - 150 in 3/10. Fe Def - ferritin 35 3/10. Both are being repleted.   Breast cancer (HCC) 03/16/2013   right, 5 o'clock   CAD (coronary artery disease) 1996   1996 - PTCA and angioplasty diagonal branch. 2000 - Rotoblator & angiopllasty of diagonal. 2006 - subendocardial AMI, DES to proximal LAD.Aaron Aas Also had 90% stenosis in distal apical LAD. EF 55 with apical hypokinesis. Indefinite ASA and Plavix.   CHF (congestive heart failure) (HCC)    Chronic kidney disease, stage 4 (severe) (HCC)    Chronic pain    CT 10/10 = Spinal stenosis L2 - S1.   COVID-19 11/02/2020   Diabetes mellitus    Insulin dependent   Diabetes mellitus type 2 in obese 05/03/2010   Managed on lantus and novolog. Has diabetic nephropathy. Metformin D/C'd 2012 2/2 creatinine.  No diabetic retinopathy per 3/11.    Gout    Hx of radiation therapy 06/02/13- 07/16/13   right rbeast 4500 cGy 25 sessions, right breast boost 1600 cGy in 8 sessions   Hyperlipidemia    Managed with both a statin and Welchol. Welchol stopped 2014 2/2 cost and started on fenofibrate    Hypertension    2006 B renal arteries patent.  2003 MRA - no RAS. 2003 pheo W/U Dr Caryn Section reportedly negative.   Hypoxia 07/23/2017   Lupus    Lymphedema of breast    Personal history of radiation therapy 2015   Pneumonia of left lung due to infectious organism    RBBB    Renal vein thrombosis (HCC)    SBO (small bowel obstruction) (HCC) 09/17/2017   Secondary hyperparathyroidism (HCC)    Stroke [facility])    Incidental finding MRI 2002 L lacunar infarct   Vitamin B12 deficiency    Vitamin D deficiency    Wears dentures    top    Medications:  Medications Prior to Admission  Medication Sig Dispense Refill Last Dose/Taking   acetaminophen (TYLENOL) 650 MG CR tablet Take 1,300 mg by mouth every 8 (eight) hours as needed for pain.   Past Week   amLODipine (NORVASC) 10 MG  tablet Take 1 tablet (10 mg total) by mouth daily. 90 tablet 3 06/15/2023 Morning   apixaban (ELIQUIS) 2.5 MG TABS tablet Take 1 tablet (2.5 mg total) by mouth 2 (two) times daily. 60 tablet 0 06/15/2023 at 10:15 AM   colestipol (COLESTID) 1 g tablet Take 1 tablet (1 g total) by mouth 3 (three) times daily. 90 tablet 2 06/15/2023 Noon   diphenoxylate-atropine (LOMOTIL) 2.5-0.025 MG tablet Take 1 tablet by mouth 2 (two) times daily as needed for diarrhea or loose stools. 30 tablet 1 Past Month   famotidine (PEPCID) 20 MG tablet Take 20 mg by mouth daily as needed for heartburn or indigestion.   Past Week   FLUoxetine (PROZAC) 20 MG capsule Take 20 mg by mouth daily.   06/15/2023 Morning   folic acid (FOLVITE) 1 MG tablet Take 1 tablet (1 mg total) by mouth daily. 30 tablet 2 06/15/2023 Morning   furosemide (LASIX) 40 MG tablet Take 1 tablet (40 mg total) by mouth daily. (Patient taking differently: Take 40-80 mg by mouth See admin instructions. Take 1 tablet by mouth daily and may take an additional tablet for excess swelling) 90 tablet 2 06/15/2023 at 10:15 AM   gabapentin (NEURONTIN) 300 MG capsule Take 300 mg by mouth at bedtime.   06/14/2023 Bedtime   HYDROcodone-acetaminophen (NORCO/VICODIN) 5-325 MG tablet Take 2 tablets by mouth every 6 (six) hours as needed. 10 tablet 0 Past Week   lipase/protease/amylase (CREON) 36000 UNITS CPEP capsule Take 1 capsule (36,000 Units total) by mouth 3 (three) times daily before meals. 180 capsule 2 06/15/2023 Noon   loperamide (IMODIUM) 2 MG capsule Take 1 capsule (2 mg total) by mouth as needed for diarrhea or loose stools. 30 capsule 0 Past Month   mirtazapine (REMERON) 7.5 MG tablet Take 7.5 mg by mouth at bedtime.   06/14/2023 Bedtime   pantoprazole (PROTONIX) 40 MG tablet Take 1 tablet (40 mg total) by mouth 2 (two) times daily. (Patient taking differently: Take 40 mg by mouth daily.) 30 tablet 0 06/15/2023 Morning   rivastigmine (EXELON) 1.5 MG capsule Take 1 capsule  (1.5 mg total) by mouth 2 (two) times daily. 60 capsule 6 06/15/2023 Morning   sevelamer carbonate (RENVELA) 800 MG tablet Take 1 tablet (800 mg total) by mouth 3 (three) times daily with meals. 90 tablet 0 06/15/2023 Noon   sodium bicarbonate 650 MG tablet Take 1 tablet (650 mg total) by mouth 2 (two) times daily. 60 tablet 0 06/15/2023 Morning   tiZANidine (ZANAFLEX) 2 MG tablet Take 2 mg by mouth at bedtime as needed for muscle spasms.  Past Week   nitroGLYCERIN (NITROSTAT) 0.4 MG SL tablet DISSOLVE 1 TABLET UNDER THE TONGUE EVERY 5 MINUTES AS  NEEDED FOR CHEST PAIN. MAX  OF 3 TABLETS IN 15 MINUTES. CALL 911 IF PAIN PERSISTS. (Patient not taking: Reported on 06/15/2023) 25 tablet 0 Not Taking   Scheduled:   cephALEXin  250 mg Oral Q12H   Chlorhexidine Gluconate Cloth  6 each Topical Q0600   Chlorhexidine Gluconate Cloth  6 each Topical Q0600   colestipol  1 g Oral TID   darbepoetin (ARANESP) injection - DIALYSIS  150 mcg Subcutaneous Q Sun-1800   FLUoxetine  20 mg Oral Daily   folic acid  1 mg Oral Daily   gabapentin  300 mg Oral QHS   insulin aspart  0-6 Units Subcutaneous TID WC   lipase/protease/amylase  36,000 Units Oral TID AC   mirtazapine  7.5 mg Oral QHS   multivitamin  1 tablet Oral QHS   pantoprazole  40 mg Oral BID   polyethylene glycol  17 g Oral Daily   rivastigmine  1.5 mg Oral BID   sevelamer carbonate  800 mg Oral TID WC   torsemide  100 mg Oral Q T,Th,S,Su    Assessment: 78 yo female on apixaban for hx DVT and renal vein thrombus. Plans are for left arm AV graft excision next week. Pharmacy consulted to dose heparin.  -Last apixaban dose given 4/12 ~ 10am   4/14 AM Update: HL > 1.1 / aPPT 62sec Hgb 7.2 / PLT wnl No signs of bleedings and no issues with the infusion overnight.  Goal of Therapy:  Heparin level 0.3-0.7 units/ml aPTT 66-102 seconds Monitor platelets by anticoagulation protocol: Yes   Plan:  -Increase heparin infusion to 1650 units/hr -Check  aPTT in 8 hrs - Check daily aPTT/HL until correlates, then only check HL thereafter. -Monitor CBC and for signs/symptoms of bleeding  Rolley Clinton [facility] PharmD Candidate 07/01/2023 10:01 AM

## 2023-07-01 NOTE — Progress Notes (Signed)
 TCU RN aware that pt continues to be inpt and will not be at first treatment today. Will assist as needed.   Lauraine Polite Renal Navigator [phone]

## 2023-07-01 NOTE — Transfer of Care (Signed)
 Immediate Anesthesia Transfer of Care Note  Patient: [name]  Procedure(s) Performed: REMOVAL OF ARTERIOVENOUS GORETEX GRAFT (AVGG) (Left) REPAIR, ARTERY, BRACHIAL USING VEIN PATCH (Left: Arm Upper)  Patient Location: PACU  Anesthesia Type:General  Level of Consciousness: awake, alert , and patient cooperative  Airway & Oxygen Therapy: Patient Spontanous Breathing and Patient connected to face mask oxygen  Post-op Assessment: Report given to RN, Post -op Vital signs reviewed and stable, Patient moving all extremities, Patient moving all extremities X 4, and Patient able to stick tongue midline  Post vital signs: Reviewed and stable  Last Vitals:  Vitals Value Taken Time  BP 172/73 07/01/23 1712  Temp 37 C 07/01/23 1706  Pulse 86 07/01/23 1715  Resp 18 07/01/23 1715  SpO2 97 % 07/01/23 1715  Vitals shown include unfiled device data.  Last Pain:  Vitals:   07/01/23 1401  TempSrc: Oral  PainSc: 8       Patients Stated Pain Goal: 0 (06/28/23 0856)  Complications: No notable events documented.

## 2023-07-01 NOTE — Op Note (Signed)
 Date: July 01, 2023  Preoperative diagnosis: Left upper arm steal syndrome with AV graft  Postoperative diagnosis: Same  Procedure: 1.  Excision of left upper arm AV graft 2.  Vein patch angioplasty of left brachial artery  Surgeon: Dr. Young Hensen, MD  Assistant: Naida Austria, PA  Indication: 78 year old female with progressive CKD 5 that recently underwent left upper arm AV graft placement Dr. Vikki Graves.  Now with complaints of steal syndrome.  Presents today for left upper arm graft excision after risks benefits discussed.  Assistant was needed given the complexity of the case and also for vein patch to the artery.  Findings: Left upper arm AV graft was excised.  The brachial/axillary vein was primarily repaired in the axilla.  I did use a small piece of brachial vein for vein patch on the brachial artery.  Palpable radial pulse at completion.  Anesthesia: General  Details: Patient was taken to the operating room after informed consent was obtained.  Placed on the operative table in supine position.  General endotracheal anesthesia was induced.  The left arm was then prepped and draped in standard sterile fashion including both of her previous incisions.  Ultimately a timeout was performed.  Antibiotics were updated.  We reopened the incision at her antecubitum and also in the left axilla.  Through each of these we dissected down and exposed the graft.  This was incorporated with no obvious signs of infection.  Initially got out the brachial artery and got Vesseloops for proximal distal control.  Up in the axilla I dissected the brachial vein out onto the axilla where the anastomosis was sewn.  I did take a small piece of vein here anteriorly at the site of the previous venous anastomosis.  This was set aside.  The venotomy was then primarily repaired with 5-0 Prolene running anastomosis and de-aired prior to completion.  I then give the patient 5000 units of IV heparin.  I used  Vesseloops for proximal distal control of the left brachial artery.  The graft was then taken off of the brachial artery with 11 blade scalpel.  I brought the vein patch on the field and this was trimmed accordingly.  I then performed a vein patch to the left brachial artery using 6-0 Prolene parachute technique with the help of my assistant and this was de-aired prior to completion.  I did have to put several additional repair sutures in there.  Ultimately both incisions were irrigated out and we used 3-0 Vicryl 4-0 Monocryl and Dermabond to close the incisions.  Palpable radial pulse at the wrist.  Complication: None  Condition: Stable  Young Hensen, MD [facility] Office: [phone]   Young Hensen

## 2023-07-01 NOTE — Progress Notes (Signed)
 [facility] [place]  Subjective  -left arm is swollen   Objective (!) 157/59 88 98.5 F (36.9 C) (Oral) 18 100%  Intake/Output Summary (Last 24 hours) at 07/01/2023 1458 Last data filed at 07/01/2023 0836 Gross per 24 hour  Intake 340 ml  Output --  Net 340 ml    Notable left arm edema Thrill in left arm AV graft  Laboratory Lab Results: Recent Labs    06/30/23 0920 07/01/23 0824  WBC 10.3 11.2*  HGB 7.8* 7.2*  HCT 25.3* 23.0*  PLT 211 223   BMET Recent Labs    06/30/23 0920 07/01/23 0824  NA 134* 136  K 5.1 5.3*  CL 99 99  CO2 24 24  GLUCOSE 140* 87  BUN 64* 83*  CREATININE 4.28* 4.99*  CALCIUM 8.7* 8.9    COAG Lab Results  Component Value Date   INR 1.5 (H) 07/28/2022   INR 1.3 (H) 02/03/2022   INR 1.3 (H) 11/05/2021   No results found for: "PTT"  Assessment/Planning:  Discussed plan for excision of left arm AV graft in the setting of steal syndrome and also has fairly significant swelling.  Will plan vein patch of the brachial artery.  Risk benefits discussed and questions answered.  Young Hensen 07/01/2023 2:58 PM --

## 2023-07-01 NOTE — Progress Notes (Signed)
 Patient ID: [name], female   DOB: November 22, 1945, 78 y.o.   MRN: [id number] S: No new complaints. O:BP 139/60 (BP Location: Right Arm)   Pulse 88   Temp 98.9 F (37.2 C) (Oral)   Resp 16   Ht (P) 5\' 4"  (1.626 m)   Wt 76.7 kg   SpO2 99%   BMI (P) 29.02 kg/m   Intake/Output Summary (Last 24 hours) at 07/01/2023 1101 Last data filed at 07/01/2023 0836 Gross per 24 hour  Intake 340 ml  Output --  Net 340 ml   Intake/Output: I/O last 3 completed shifts: In: 340 [P.O.:340] Out: 100 [Urine:100]  Intake/Output this shift:  No intake/output data recorded. Weight change: 1.176 kg Gen: NAD CVS: RRR Resp: CTA Abd: +BS, soft, NT/ND Ext: LUE AVG +T/B, + edema of left arm.  Recent Labs  Lab 06/26/23 0219 06/28/23 0253 06/29/23 0412 06/30/23 0920 07/01/23 0824  NA 134* 137 135 134* 136  K 3.7 4.3 4.1 5.1 5.3*  CL 98 100 98 99 99  CO2 23 23 26 24 24   GLUCOSE 139* 139* 107* 140* 87  BUN 67* 65* 40* 64* 83*  CREATININE 4.76* 5.09* 3.30* 4.28* 4.99*  ALBUMIN 2.0*  --   --   --   --   CALCIUM 8.4* 8.6* 8.8* 8.7* 8.9  PHOS 3.1  --   --   --   --    Liver Function Tests: Recent Labs  Lab 06/26/23 0219  ALBUMIN 2.0*   No results for input(s): "LIPASE", "AMYLASE" in the last 168 hours. No results for input(s): "AMMONIA" in the last 168 hours. CBC: Recent Labs  Lab 06/26/23 0219 06/27/23 0748 06/28/23 0253 06/29/23 0412 06/30/23 0920 07/01/23 0824  WBC 11.9* 9.0 9.6 8.6 10.3 11.2*  NEUTROABS 9.1*  --   --   --   --   --   HGB 7.6* 8.3* 7.6* 7.9* 7.8* 7.2*  HCT 24.6* 27.2* 24.8* 25.3* 25.3* 23.0*  MCV 93.5 92.5 93.9 92.3 93.0 92.4  PLT 186 207 197 199 211 223   Cardiac Enzymes: No results for input(s): "CKTOTAL", "CKMB", "CKMBINDEX", "TROPONINI" in the last 168 hours. CBG: Recent Labs  Lab 06/30/23 1320 06/30/23 1636 06/30/23 2114 07/01/23 0608 07/01/23 1050  GLUCAP 159* 126* 171* 91 82    Iron Studies: No results for input(s): "IRON", "TIBC",  "TRANSFERRIN", "FERRITIN" in the last 72 hours. Studies/Results: No results found.  cephALEXin  250 mg Oral Q12H   Chlorhexidine Gluconate Cloth  6 each Topical Q0600   Chlorhexidine Gluconate Cloth  6 each Topical Q0600   colestipol  1 g Oral TID   darbepoetin (ARANESP) injection - DIALYSIS  150 mcg Subcutaneous Q Sun-1800   FLUoxetine  20 mg Oral Daily   folic acid  1 mg Oral Daily   gabapentin  300 mg Oral QHS   insulin aspart  0-6 Units Subcutaneous TID WC   lipase/protease/amylase  36,000 Units Oral TID AC   mirtazapine  7.5 mg Oral QHS   multivitamin  1 tablet Oral QHS   pantoprazole  40 mg Oral BID   polyethylene glycol  17 g Oral Daily   rivastigmine  1.5 mg Oral BID   sevelamer carbonate  800 mg Oral TID WC   torsemide  100 mg Oral Q T,Th,S,Su    BMET    Component Value Date/Time   NA 136 07/01/2023 0824   NA 144 07/19/2020 1458   NA 140 03/14/2017 0810   K  5.3 (H) 07/01/2023 0824   K 3.8 03/14/2017 0810   CL 99 07/01/2023 0824   CO2 24 07/01/2023 0824   CO2 27 03/14/2017 0810   GLUCOSE 87 07/01/2023 0824   GLUCOSE 177 (H) 03/14/2017 0810   BUN 83 (H) 07/01/2023 0824   BUN 85 (HH) 07/19/2020 1458   BUN 35.6 (H) 03/14/2017 0810   CREATININE 4.99 (H) 07/01/2023 0824   CREATININE 2.31 (H) 10/25/2022 1355   CREATININE 1.8 (H) 03/14/2017 0810   CALCIUM 8.9 07/01/2023 0824   CALCIUM 8.3 (L) 06/18/2023 0334   CALCIUM 9.6 03/14/2017 0810   GFRNONAA 9 (L) 07/01/2023 0824   GFRNONAA 22 (L) 10/25/2022 1355   GFRNONAA 45 (L) 12/17/2013 1158   GFRAA 21 (L) 12/21/2019 0845   GFRAA 52 (L) 12/17/2013 1158   CBC    Component Value Date/Time   WBC 11.2 (H) 07/01/2023 0824   RBC 2.49 (L) 07/01/2023 0824   HGB 7.2 (L) 07/01/2023 0824   HGB 9.4 (L) 04/18/2023 1434   HGB 9.3 (L) 03/14/2017 0810   HCT 23.0 (L) 07/01/2023 0824   HCT 26.4 (L) 07/21/2019 0809   HCT 29.2 (L) 03/14/2017 0810   PLT 223 07/01/2023 0824   PLT 235 04/18/2023 1434   PLT 292 03/14/2017 0810    PLT 351 10/11/2016 1139   MCV 92.4 07/01/2023 0824   MCV 81.3 03/14/2017 0810   MCH 28.9 07/01/2023 0824   MCHC 31.3 07/01/2023 0824   RDW 17.5 (H) 07/01/2023 0824   RDW 14.0 03/14/2017 0810   LYMPHSABS 1.5 06/26/2023 0219   LYMPHSABS 1.4 03/14/2017 0810   MONOABS 0.8 06/26/2023 0219   MONOABS 0.4 03/14/2017 0810   EOSABS 0.1 06/26/2023 0219   EOSABS 0.1 03/14/2017 0810   BASOSABS 0.0 06/26/2023 0219   BASOSABS 0.0 03/14/2017 0810   HPI:  Pt is a 78 y.o.  with  hypertension, renal vein thrombosis on Eliquis, stroke, type II DM, lupus, CKD 4-5 who was recently admitted for CHF exacerbation and progressive CKD presented back with worsening dyspnea on exertion and fluid overload.     Assessment/Plan:  # Progressive CKD 5 versus AKI on CKD,: With frequent hospitalization for fluid overload.  Responding to IV diuretics but not po and with worsening renal function d/w patient and family (Daughter on phone 4/2) at length recommend starting dialysis. S/p LUE AVG + TDC placement 4/4 with HD 1, HD 2 4/5,  - HD 06/24/23, 4/9 and 4/11 -  on MWF schedule  - CLIP complete-  has been accepted at [facility] -  now with complication of AVG, possible excision today per Dr. Karin Lieu -  will do HD today after surgery and delay her discharge.   # CKD-MBD/hyperphosphatemia: Started low dose sevelamer in the recent visit, monitor lab.  PTH 220.  Phos is great now   #Anemia of CKD: Concern about GI bleed, underwent endoscopy t recently with no sign of bleeding.  Back on Eliquis.  Hb was 6.4 and now 8s. after transfusion.  Giving iron and ESA   # Hypertension/volume: [name] improved after diuresis.  Torsemide on non-dialysis days   #A fib: on eliquis   # fever:  - cultures drawn, follow - vanc with HD and ceftriaxone --  now just keflex - VVS following and plan for excision of AVG today-   still needing IV pain meds-  now concern is for steal    Irena Cords, MD [facility]

## 2023-07-01 NOTE — Plan of Care (Signed)
  Problem: Health Behavior/Discharge Planning: Goal: Ability to identify and utilize available resources and services will improve Outcome: Progressing   Problem: Nutritional: Goal: Maintenance of adequate nutrition will improve Outcome: Progressing   Problem: Health Behavior/Discharge Planning: Goal: Ability to manage health-related needs will improve Outcome: Progressing

## 2023-07-01 NOTE — Progress Notes (Signed)
 Mobility Specialist Progress Note:   07/01/23 0925  Mobility  Activity Ambulated with assistance in hallway  Level of Assistance Standby assist, set-up cues, supervision of patient - no hands on  Assistive Device Front wheel walker  Distance Ambulated (ft) 200 ft  Activity Response Tolerated well  Mobility Referral Yes  Mobility visit 1 Mobility  Mobility Specialist Start Time (ACUTE ONLY) [id number]  Mobility Specialist Stop Time (ACUTE ONLY) 0935  Mobility Specialist Time Calculation (min) (ACUTE ONLY) 10 min   Pt agreeable to mobility session. Required no physical assistance throughout ambulation with RW. No c/o throughout. Pt left in chair with all needs met.   Oneda Big Mobility Specialist Please contact via SecureChat or  Rehab office at [phone]

## 2023-07-01 NOTE — Progress Notes (Signed)
 PHARMACY - ANTICOAGULATION CONSULT NOTE  Pharmacy Consult for heparin Indication: hx DVT and renal vein thrombus   Allergies  Allergen Reactions   Anesthetics, Halogenated Other (See Comments)    Per patient's daughter, she can not tolerate anesthetics.    Etomidate Other (See Comments)    Heart stopped during anesthesia   Fentanyl Other (See Comments)    Heart stopped during anesthesia    Percocet [Oxycodone-Acetaminophen] Other (See Comments)    Headaches    Diazepam Other (See Comments)    Agitation   Lorazepam Nausea Only   Tramadol Hcl Nausea And Vomiting   Haloperidol Nausea And Vomiting    **Haldol**   Morphine Sulfate Nausea Only    agitation   Propoxyphene Nausea And Vomiting    **Darvocet** no longer available   Versed [Midazolam] Other (See Comments)    Heart stopped during anesthesia    Patient Measurements: Height: (P) 5\' 4"  (162.6 cm) Weight: 76.7 kg (169 lb 1.5 oz) IBW/kg (Calculated) : (P) 54.7 HEPARIN DW (KG): (P) 71  Vital Signs: Temp: 98.8 F (37.1 C) (04/14 2026) Temp Source: Oral (04/14 2026) BP: 151/55 (04/14 2026) Pulse Rate: 86 (04/14 2026)  Labs: Recent Labs    06/29/23 0412 06/30/23 0920 06/30/23 1947 07/01/23 0824  HGB 7.9* 7.8*  --  7.2*  HCT 25.3* 25.3*  --  23.0*  PLT 199 211  --  223  APTT  --  43* 32 62*  HEPARINUNFRC  --  >1.10*  --  >1.10*  CREATININE 3.30* 4.28*  --  4.99*    Estimated Creatinine Clearance: 9.8 mL/min (A) (by C-G formula based on SCr of 4.99 mg/dL (H)).   Medical History: Past Medical History:  Diagnosis Date   Abdominal discomfort    Chronic N/V/D. Presumptive dx Crohn's dx per elevated p ANCA. Failed Entocort and Pentasa. Sep 2003 - ileocolectomy c anastomosis per Dr Duwaine Gins 2/2 adhesions - path was hegative for Crohns. EGD, Sm bowel follow through (11/03), and an eteroclysis (10/03) were unrevealing. Cuases hypomag and hypocalcemia.   Adnexal mass 10/2001   s/p lap BSO (R ovarian fibroma) & lysis  of adhesions   Allergy    Seasonal   Anemia    Multifactorial. Baseline HgB 10-11 ish. B12 def - 150 in 3/10. Fe Def - ferritin 35 3/10. Both are being repleted.   Breast cancer (HCC) 03/16/2013   right, 5 o'clock   CAD (coronary artery disease) 1996   1996 - PTCA and angioplasty diagonal branch. 2000 - Rotoblator & angiopllasty of diagonal. 2006 - subendocardial AMI, DES to proximal LAD.Aaron Aas Also had 90% stenosis in distal apical LAD. EF 55 with apical hypokinesis. Indefinite ASA and Plavix.   CHF (congestive heart failure) (HCC)    Chronic kidney disease, stage 4 (severe) (HCC)    Chronic pain    CT 10/10 = Spinal stenosis L2 - S1.   COVID-19 11/02/2020   Diabetes mellitus    Insulin dependent   Diabetes mellitus type 2 in obese 05/03/2010   Managed on lantus and novolog. Has diabetic nephropathy. Metformin D/C'd 2012 2/2 creatinine.  No diabetic retinopathy per 3/11.    Gout    Hx of radiation therapy 06/02/13- 07/16/13   right rbeast 4500 cGy 25 sessions, right breast boost 1600 cGy in 8 sessions   Hyperlipidemia    Managed with both a statin and Welchol. Welchol stopped 2014 2/2 cost and started on fenofibrate    Hypertension    2006 B renal arteries patent.  2003 MRA - no RAS. 2003 pheo W/U Dr Caryn Section reportedly negative.   Hypoxia 07/23/2017   Lupus    Lymphedema of breast    Personal history of radiation therapy 2015   Pneumonia of left lung due to infectious organism    RBBB    Renal vein thrombosis (HCC)    SBO (small bowel obstruction) (HCC) 09/17/2017   Secondary hyperparathyroidism (HCC)    Stroke [name])    Incidental finding MRI 2002 L lacunar infarct   Vitamin B12 deficiency    Vitamin D deficiency    Wears dentures    top    Medications:  Medications Prior to Admission  Medication Sig Dispense Refill Last Dose/Taking   acetaminophen (TYLENOL) 650 MG CR tablet Take 1,300 mg by mouth every 8 (eight) hours as needed for pain.   Past Week   amLODipine (NORVASC) 10 MG  tablet Take 1 tablet (10 mg total) by mouth daily. 90 tablet 3 06/15/2023 Morning   apixaban (ELIQUIS) 2.5 MG TABS tablet Take 1 tablet (2.5 mg total) by mouth 2 (two) times daily. 60 tablet 0 06/15/2023 at 10:15 AM   colestipol (COLESTID) 1 g tablet Take 1 tablet (1 g total) by mouth 3 (three) times daily. 90 tablet 2 06/15/2023 Noon   diphenoxylate-atropine (LOMOTIL) 2.5-0.025 MG tablet Take 1 tablet by mouth 2 (two) times daily as needed for diarrhea or loose stools. 30 tablet 1 Past Month   famotidine (PEPCID) 20 MG tablet Take 20 mg by mouth daily as needed for heartburn or indigestion.   Past Week   FLUoxetine (PROZAC) 20 MG capsule Take 20 mg by mouth daily.   06/15/2023 Morning   folic acid (FOLVITE) 1 MG tablet Take 1 tablet (1 mg total) by mouth daily. 30 tablet 2 06/15/2023 Morning   furosemide (LASIX) 40 MG tablet Take 1 tablet (40 mg total) by mouth daily. (Patient taking differently: Take 40-80 mg by mouth See admin instructions. Take 1 tablet by mouth daily and may take an additional tablet for excess swelling) 90 tablet 2 06/15/2023 at 10:15 AM   gabapentin (NEURONTIN) 300 MG capsule Take 300 mg by mouth at bedtime.   06/14/2023 Bedtime   HYDROcodone-acetaminophen (NORCO/VICODIN) 5-325 MG tablet Take 2 tablets by mouth every 6 (six) hours as needed. 10 tablet 0 Past Week   lipase/protease/amylase (CREON) 36000 UNITS CPEP capsule Take 1 capsule (36,000 Units total) by mouth 3 (three) times daily before meals. 180 capsule 2 06/15/2023 Noon   loperamide (IMODIUM) 2 MG capsule Take 1 capsule (2 mg total) by mouth as needed for diarrhea or loose stools. 30 capsule 0 Past Month   mirtazapine (REMERON) 7.5 MG tablet Take 7.5 mg by mouth at bedtime.   06/14/2023 Bedtime   pantoprazole (PROTONIX) 40 MG tablet Take 1 tablet (40 mg total) by mouth 2 (two) times daily. (Patient taking differently: Take 40 mg by mouth daily.) 30 tablet 0 06/15/2023 Morning   rivastigmine (EXELON) 1.5 MG capsule Take 1 capsule  (1.5 mg total) by mouth 2 (two) times daily. 60 capsule 6 06/15/2023 Morning   sevelamer carbonate (RENVELA) 800 MG tablet Take 1 tablet (800 mg total) by mouth 3 (three) times daily with meals. 90 tablet 0 06/15/2023 Noon   sodium bicarbonate 650 MG tablet Take 1 tablet (650 mg total) by mouth 2 (two) times daily. 60 tablet 0 06/15/2023 Morning   tiZANidine (ZANAFLEX) 2 MG tablet Take 2 mg by mouth at bedtime as needed for muscle spasms.  Past Week   nitroGLYCERIN (NITROSTAT) 0.4 MG SL tablet DISSOLVE 1 TABLET UNDER THE TONGUE EVERY 5 MINUTES AS  NEEDED FOR CHEST PAIN. MAX  OF 3 TABLETS IN 15 MINUTES. CALL 911 IF PAIN PERSISTS. (Patient not taking: Reported on 06/15/2023) 25 tablet 0 Not Taking   Scheduled:   cephALEXin  250 mg Oral Q12H   Chlorhexidine Gluconate Cloth  6 each Topical Q0600   colestipol  1 g Oral TID   darbepoetin (ARANESP) injection - DIALYSIS  150 mcg Subcutaneous Q Sun-1800   fentaNYL       FLUoxetine  20 mg Oral Daily   folic acid  1 mg Oral Daily   gabapentin  300 mg Oral QHS   heparin sodium (porcine)       insulin aspart  0-6 Units Subcutaneous TID WC   lipase/protease/amylase  36,000 Units Oral TID AC   mirtazapine  7.5 mg Oral QHS   multivitamin  1 tablet Oral QHS   pantoprazole  40 mg Oral BID   polyethylene glycol  17 g Oral Daily   rivastigmine  1.5 mg Oral BID   sevelamer carbonate  800 mg Oral TID WC   torsemide  100 mg Oral Q T,Th,S,Su    Assessment: 78 yo female on apixaban for hx DVT and renal vein thrombus on IV heparin. He is now s/p AV graft excision and heparin to restart 4/15 per Dr. Fulton Job  Goal of Therapy:  Heparin level 0.3-0.7 units/ml aPTT 66-102 seconds Monitor platelets by anticoagulation protocol: Yes   Plan:  -CBC in am -restart heparin at 1650 units/hr at 8am -heparin level and aPTT 8 hrs after heparin restarted  Baxter Limber, PharmD Clinical Pharmacist **Pharmacist phone directory can now be found on [email or web address] ([email or web address]).  Listed  under [facility].

## 2023-07-01 NOTE — Anesthesia Preprocedure Evaluation (Signed)
 Anesthesia Evaluation  Patient identified by MRN, date of birth, ID band Patient awake    Reviewed: Allergy & Precautions, H&P , NPO status , Patient's Chart, lab work & pertinent test results  Airway Mallampati: II   Neck ROM: full    Dental   Pulmonary shortness of breath   breath sounds clear to auscultation       Cardiovascular hypertension, + CAD and + Cardiac Stents   Rhythm:regular Rate:Normal     Neuro/Psych  PSYCHIATRIC DISORDERS  Depression    CVA    GI/Hepatic ,GERD  ,,  Endo/Other  diabetes, Type 2    Renal/GU ESRF and DialysisRenal disease     Musculoskeletal   Abdominal   Peds  Hematology   Anesthesia Other Findings   Reproductive/Obstetrics                             Anesthesia Physical Anesthesia Plan  ASA: 4  Anesthesia Plan: General   Post-op Pain Management:    Induction: Intravenous  PONV Risk Score and Plan: 3 and Ondansetron, Dexamethasone, Propofol infusion and Treatment may vary due to age or medical condition  Airway Management Planned: LMA  Additional Equipment:   Intra-op Plan:   Post-operative Plan: Extubation in OR  Informed Consent: I have reviewed the patients History and Physical, chart, labs and discussed the procedure including the risks, benefits and alternatives for the proposed anesthesia with the patient or authorized representative who has indicated his/her understanding and acceptance.     Dental advisory given  Plan Discussed with: CRNA, Anesthesiologist and Surgeon  Anesthesia Plan Comments:        Anesthesia Quick Evaluation

## 2023-07-01 NOTE — Progress Notes (Signed)
 PROGRESS NOTE    [name]  WUJ:[id number]  DOB: Jan 01, 1946  DOA: 06/15/2023 PCP: Renford Dills, MD Outpatient Specialists:   Hospital course:  77/F with CAD, chronic diastolic CHF, CKD 4, history of DVT, renal vein thrombosis on Eliquis, CVA, depression, memory loss, type 2 diabetes mellitus, history of breast cancer, lupus was admitted 3/8-15 with volume overload, progressive CKD, followed by nephrology, diuresed and discharged home on oral diuretics, also seen by gastroenterology for worsening chronic anemia, EGD and colonoscopy were unremarkable, Eliquis resumed at discharge. -Back in the ED 3/29 with progressive dyspnea on exertion orthopnea lower extremity edema and weight gain. -In the ED hypertensive, otherwise vital signs stable, hemoglobin 7.0, creatinine 4.0, BUN 82, BNP 560, troponin 125, chest x-ray unremarkable -Admitted, started on IV diuretics, nephrology following -4/2, vascular surgery consult, plan to start HD -4/4: tunneled HD cath placed, left arm AV graft -4/7: low BP, DC held, ? Infection at AVG site -4/8: started Abx -4/9-10 onwards, persistent severe left arm pain -4/12 vascular surgery consulted, plan for AV graft excision -4/14 s/p excision of AV graft   Subjective:  Patient without new complaints, notes her left arm is still swollen.  Is wondering if she is going to have hemodialysis today.   Objective: Vitals:   07/01/23 0716 07/01/23 0916 07/01/23 1049 07/01/23 1401  BP: (!) 134/46 (!) 137/46 139/60 (!) 157/59  Pulse: 81 86 88 88  Resp: 16 18 16 18   Temp: 98.6 F (37 C) 99.3 F (37.4 C) 98.9 F (37.2 C) 98.5 F (36.9 C)  TempSrc: Oral Oral Oral Oral  SpO2: 96% 98% 99% 100%  Weight:      Height:        Intake/Output Summary (Last 24 hours) at 07/01/2023 1602 Last data filed at 07/01/2023 0836 Gross per 24 hour  Intake 340 ml  Output --  Net 340 ml   Filed Weights   06/29/23 0421 06/30/23 0553 07/01/23 0428  Weight: 72.8 kg  75.5 kg 76.7 kg     Exam:  General: Totally well-appearing female sitting up in bed in NAD Eyes: sclera anicteric, conjuctiva mild injection bilaterally CVS: S1-S2, regular  Respiratory:  decreased air entry bilaterally secondary to decreased inspiratory effort, rales at bases  GI: NABS, soft, NT  LE: LUE with some erythema and edema graft site Neuro: A/O x 3,  grossly nonfocal.    Data Reviewed:  Basic Metabolic Panel: Recent Labs  Lab 06/26/23 0219 06/28/23 0253 06/29/23 0412 06/30/23 0920 07/01/23 0824  NA 134* 137 135 134* 136  K 3.7 4.3 4.1 5.1 5.3*  CL 98 100 98 99 99  CO2 23 23 26 24 24   GLUCOSE 139* 139* 107* 140* 87  BUN 67* 65* 40* 64* 83*  CREATININE 4.76* 5.09* 3.30* 4.28* 4.99*  CALCIUM 8.4* 8.6* 8.8* 8.7* 8.9  PHOS 3.1  --   --   --   --     CBC: Recent Labs  Lab 06/26/23 0219 06/27/23 0748 06/28/23 0253 06/29/23 0412 06/30/23 0920 07/01/23 0824  WBC 11.9* 9.0 9.6 8.6 10.3 11.2*  NEUTROABS 9.1*  --   --   --   --   --   HGB 7.6* 8.3* 7.6* 7.9* 7.8* 7.2*  HCT 24.6* 27.2* 24.8* 25.3* 25.3* 23.0*  MCV 93.5 92.5 93.9 92.3 93.0 92.4  PLT 186 207 197 199 211 223     Scheduled Meds:  [MAR Hold] cephALEXin  250 mg Oral Q12H   [MAR Hold] Chlorhexidine  Gluconate Cloth  6 each Topical Q0600   [MAR Hold] colestipol  1 g Oral TID   [MAR Hold] darbepoetin (ARANESP) injection - DIALYSIS  150 mcg Subcutaneous Q Sun-1800   [MAR Hold] FLUoxetine  20 mg Oral Daily   [MAR Hold] folic acid  1 mg Oral Daily   [MAR Hold] gabapentin  300 mg Oral QHS   [MAR Hold] insulin aspart  0-6 Units Subcutaneous TID WC   [MAR Hold] lipase/protease/amylase  36,000 Units Oral TID AC   [MAR Hold] mirtazapine  7.5 mg Oral QHS   [MAR Hold] multivitamin  1 tablet Oral QHS   [MAR Hold] pantoprazole  40 mg Oral BID   [MAR Hold] polyethylene glycol  17 g Oral Daily   [MAR Hold] rivastigmine  1.5 mg Oral BID   [MAR Hold] sevelamer carbonate  800 mg Oral TID WC   [MAR Hold]  torsemide  100 mg Oral Q T,Th,S,Su   Continuous Infusions:  sodium chloride 10 mL/hr at 07/01/23 1415     Assessment & Plan:   Left arm AV graft complicated by steal syndrome S/p excision today Had been treated with broad-spectrum antibiotics but changed to oral Keflex given negative blood cultures. Plan is to DC Keflex tomorrow.  Progressive CKD/ESRD Being followed closely by nephrology for hemodialysis as warranted  Acute on chronic HFpEF Doing well with hemodialysis Continue torsemide 100 QOD  Gout flare, left foot Resolved with 3 days of prednisone Would consider longer course of prednisone if pain recurs  VTE  H/o renal vein thrombosis H/o DVT On heparin drip  Mild dementia Depression Continue fluoxetine and rivastigmine  Early DM2 SSI A1c 6.2 in February    DVT prophylaxis: On IV heparin Code Status: Full Family Communication: Spoke with patient's daughter over the phone     Studies: No results found.  Principal Problem:   Acute on chronic diastolic CHF (congestive heart failure) (HCC) Active Problems:   Chronic kidney disease (CKD), stage V (HCC)   Essential hypertension   Acute on chronic anemia   GERD (gastroesophageal reflux disease)   Type 2 diabetes mellitus (HCC)   Gout flare   CAD (coronary artery disease)   Depression   Breast cancer of lower-inner quadrant of right female breast (HCC)     Andoni Busch Rollene Clink, [facility]  If 7PM-7AM, please contact night-coverage [email or web address]   LOS: 15 days

## 2023-07-02 ENCOUNTER — Encounter (HOSPITAL_COMMUNITY): Payer: Self-pay | Admitting: Vascular Surgery

## 2023-07-02 ENCOUNTER — Inpatient Hospital Stay (HOSPITAL_COMMUNITY)

## 2023-07-02 DIAGNOSIS — I5033 Acute on chronic diastolic (congestive) heart failure: Secondary | ICD-10-CM | POA: Diagnosis not present

## 2023-07-02 LAB — URINALYSIS, ROUTINE W REFLEX MICROSCOPIC
Bilirubin Urine: NEGATIVE
Glucose, UA: NEGATIVE mg/dL
Hgb urine dipstick: NEGATIVE
Ketones, ur: NEGATIVE mg/dL
Nitrite: NEGATIVE
Protein, ur: >=300 mg/dL — AB
Specific Gravity, Urine: 1.012 (ref 1.005–1.030)
pH: 5 (ref 5.0–8.0)

## 2023-07-02 LAB — GLUCOSE, CAPILLARY
Glucose-Capillary: 121 mg/dL — ABNORMAL HIGH (ref 70–99)
Glucose-Capillary: 106 mg/dL — ABNORMAL HIGH (ref 70–99)
Glucose-Capillary: 100 mg/dL — ABNORMAL HIGH (ref 70–99)
Glucose-Capillary: 109 mg/dL — ABNORMAL HIGH (ref 70–99)

## 2023-07-02 LAB — CBC
HCT: 24.8 % — ABNORMAL LOW (ref 36.0–46.0)
Hemoglobin: 7.9 g/dL — ABNORMAL LOW (ref 12.0–15.0)
MCH: 29.4 pg (ref 26.0–34.0)
MCHC: 31.9 g/dL (ref 30.0–36.0)
MCV: 92.2 fL (ref 80.0–100.0)
Platelets: 214 10*3/uL (ref 150–400)
RBC: 2.69 MIL/uL — ABNORMAL LOW (ref 3.87–5.11)
RDW: 17.5 % — ABNORMAL HIGH (ref 11.5–15.5)
WBC: 14.6 10*3/uL — ABNORMAL HIGH (ref 4.0–10.5)
nRBC: 0 % (ref 0.0–0.2)

## 2023-07-02 LAB — HEPARIN LEVEL (UNFRACTIONATED): Heparin Unfractionated: 0.68 [IU]/mL (ref 0.30–0.70)

## 2023-07-02 LAB — APTT: aPTT: 78 s — ABNORMAL HIGH (ref 24–36)

## 2023-07-02 LAB — MRSA NEXT GEN BY PCR, NASAL: MRSA by PCR Next Gen: NOT DETECTED

## 2023-07-02 MED ORDER — CEFTRIAXONE SODIUM 2 G IJ SOLR: 2.0000 g | INTRAMUSCULAR | Status: DC

## 2023-07-02 MED ORDER — VANCOMYCIN HCL 750 MG/150ML IV SOLN
750.0000 mg | INTRAVENOUS | Status: DC
  Administered 2023-07-03: 750 mg via INTRAVENOUS
  Filled 2023-07-02 (×2): qty 150

## 2023-07-02 MED ORDER — VANCOMYCIN HCL 1500 MG/300ML IV SOLN
1500.0000 mg | Freq: Once | INTRAVENOUS | Status: AC
  Administered 2023-07-02: 1500 mg via INTRAVENOUS
  Filled 2023-07-02 (×2): qty 300

## 2023-07-02 MED ORDER — CEFTRIAXONE SODIUM 1 G IJ SOLR: 1.0000 g | INTRAMUSCULAR | Status: DC

## 2023-07-02 MED ORDER — PIPERACILLIN-TAZOBACTAM IN DEX 2-0.25 GM/50ML IV SOLN
2.2500 g | Freq: Three times a day (TID) | INTRAVENOUS | Status: DC
  Administered 2023-07-02 – 2023-07-04 (×6): 2.25 g via INTRAVENOUS
  Filled 2023-07-02 (×9): qty 50

## 2023-07-02 NOTE — Progress Notes (Signed)
 Patient ID: [name], female   DOB: 03-17-1946, 78 y.o.   MRN: [id number] S: Feeling better today.  S/p excision of LUE AVG yesterday due to steal syndrome O:BP (!) 138/50 (BP Location: Right Arm)   Pulse 81   Temp 98.6 F (37 C) (Oral)   Resp 18   Ht (P) 5\' 4"  (1.626 m)   Wt 76.7 kg   SpO2 100%   BMI (P) 29.02 kg/m   Intake/Output Summary (Last 24 hours) at 07/02/2023 1033 Last data filed at 07/01/2023 2147 Gross per 24 hour  Intake 320 ml  Output --  Net 320 ml   Intake/Output: I/O last 3 completed shifts: In: 420 [P.O.:220; I.V.:200] Out: -   Intake/Output this shift:  No intake/output data recorded. Weight change: 0 kg Gen: NAD CVS:RRR Resp: CTA Abd:+BS, soft, NT/ND Ext - LUE arm wrapped with some edema of hand  Recent Labs  Lab 06/26/23 0219 06/28/23 0253 06/29/23 0412 06/30/23 0920 07/01/23 0824 07/01/23 2200  NA 134* 137 135 134* 136 134*  K 3.7 4.3 4.1 5.1 5.3* 5.4*  CL 98 100 98 99 99 101  CO2 23 23 26 24 24 22   GLUCOSE 139* 139* 107* 140* 87 162*  BUN 67* 65* 40* 64* 83* 85*  CREATININE 4.76* 5.09* 3.30* 4.28* 4.99* 5.37*  ALBUMIN 2.0*  --   --   --   --  2.1*  CALCIUM 8.4* 8.6* 8.8* 8.7* 8.9 8.7*  PHOS 3.1  --   --   --   --  5.1*   Liver Function Tests: Recent Labs  Lab 06/26/23 0219 07/01/23 2200  ALBUMIN 2.0* 2.1*   No results for input(s): "LIPASE", "AMYLASE" in the last 168 hours. No results for input(s): "AMMONIA" in the last 168 hours. CBC: Recent Labs  Lab 06/26/23 0219 06/27/23 0748 06/29/23 0412 06/30/23 0920 07/01/23 0824 07/01/23 2200 07/02/23 0339  WBC 11.9*   < > 8.6 10.3 11.2* 10.2 14.6*  NEUTROABS 9.1*  --   --   --   --  8.3*  --   HGB 7.6*   < > 7.9* 7.8* 7.2* 6.9* 7.9*  HCT 24.6*   < > 25.3* 25.3* 23.0* 22.7* 24.8*  MCV 93.5   < > 92.3 93.0 92.4 95.4 92.2  PLT 186   < > 199 211 223 231 214   < > = values in this interval not displayed.   Cardiac Enzymes: No results for input(s): "CKTOTAL", "CKMB",  "CKMBINDEX", "TROPONINI" in the last 168 hours. CBG: Recent Labs  Lab 07/01/23 1050 07/01/23 1344 07/01/23 1713 07/01/23 2136 07/02/23 0546  GLUCAP 82 83 94 167* 121*    Iron Studies: No results for input(s): "IRON", "TIBC", "TRANSFERRIN", "FERRITIN" in the last 72 hours. Studies/Results: No results found.  cephALEXin  250 mg Oral Q12H   Chlorhexidine Gluconate Cloth  6 each Topical Q0600   colestipol  1 g Oral TID   darbepoetin (ARANESP) injection - DIALYSIS  150 mcg Subcutaneous Q Sun-1800   FLUoxetine  20 mg Oral Daily   folic acid  1 mg Oral Daily   gabapentin  300 mg Oral QHS   insulin aspart  0-6 Units Subcutaneous TID WC   lipase/protease/amylase  36,000 Units Oral TID AC   mirtazapine  7.5 mg Oral QHS   multivitamin  1 tablet Oral QHS   pantoprazole  40 mg Oral BID   polyethylene glycol  17 g Oral Daily   rivastigmine  1.5 mg Oral  BID   sevelamer carbonate  800 mg Oral TID WC   torsemide  100 mg Oral Q T,Th,S,Su    BMET    Component Value Date/Time   NA 134 (L) 07/01/2023 2200   NA 144 07/19/2020 1458   NA 140 03/14/2017 0810   K 5.4 (H) 07/01/2023 2200   K 3.8 03/14/2017 0810   CL 101 07/01/2023 2200   CO2 22 07/01/2023 2200   CO2 27 03/14/2017 0810   GLUCOSE 162 (H) 07/01/2023 2200   GLUCOSE 177 (H) 03/14/2017 0810   BUN 85 (H) 07/01/2023 2200   BUN 85 (HH) 07/19/2020 1458   BUN 35.6 (H) 03/14/2017 0810   CREATININE 5.37 (H) 07/01/2023 2200   CREATININE 2.31 (H) 10/25/2022 1355   CREATININE 1.8 (H) 03/14/2017 0810   CALCIUM 8.7 (L) 07/01/2023 2200   CALCIUM 8.3 (L) 06/18/2023 0334   CALCIUM 9.6 03/14/2017 0810   GFRNONAA 8 (L) 07/01/2023 2200   GFRNONAA 22 (L) 10/25/2022 1355   GFRNONAA 45 (L) 12/17/2013 1158   GFRAA 21 (L) 12/21/2019 0845   GFRAA 52 (L) 12/17/2013 1158   CBC    Component Value Date/Time   WBC 14.6 (H) 07/02/2023 0339   RBC 2.69 (L) 07/02/2023 0339   HGB 7.9 (L) 07/02/2023 0339   HGB 9.4 (L) 04/18/2023 1434   HGB 9.3 (L)  03/14/2017 0810   HCT 24.8 (L) 07/02/2023 0339   HCT 26.4 (L) 07/21/2019 0809   HCT 29.2 (L) 03/14/2017 0810   PLT 214 07/02/2023 0339   PLT 235 04/18/2023 1434   PLT 292 03/14/2017 0810   PLT 351 10/11/2016 1139   MCV 92.2 07/02/2023 0339   MCV 81.3 03/14/2017 0810   MCH 29.4 07/02/2023 0339   MCHC 31.9 07/02/2023 0339   RDW 17.5 (H) 07/02/2023 0339   RDW 14.0 03/14/2017 0810   LYMPHSABS 1.0 07/01/2023 2200   LYMPHSABS 1.4 03/14/2017 0810   MONOABS 0.6 07/01/2023 2200   MONOABS 0.4 03/14/2017 0810   EOSABS 0.1 07/01/2023 2200   EOSABS 0.1 03/14/2017 0810   BASOSABS 0.0 07/01/2023 2200   BASOSABS 0.0 03/14/2017 0810    HPI:  Pt is a 78 y.o.  with  hypertension, renal vein thrombosis on Eliquis, stroke, type II DM, lupus, CKD 4-5 who was recently admitted for CHF exacerbation and progressive CKD presented back with worsening dyspnea on exertion and fluid overload.      Assessment/Plan:   # Progressive CKD 5 versus AKI on CKD,: With frequent hospitalization for fluid overload.  Responding to IV diuretics but not po and with worsening renal function d/w patient and family (Daughter on phone 4/2) at length recommend starting dialysis. S/p LUE AVG + TDC placement 4/4 with HD 1, HD 2 4/5,  - HD 06/24/23, 4/9, 4/11, 4/14 -  on MWF schedule and plan for HD tomorrow. - CLIP complete-  has been accepted at [facility] -  now with complication of AVG, possible excision today per Dr. Rosalva Comber -  will do HD today after surgery and delay her discharge.   # CKD-MBD/hyperphosphatemia: Started low dose sevelamer in the recent visit, monitor lab.  PTH 220.  Phos is great now   #Anemia of CKD: Concern about GI bleed, underwent endoscopy t recently with no sign of bleeding.  Back on Eliquis.  Hb was 6.4 and now 8s. after transfusion.  Giving iron and ESA   # Hypertension/volume: [name] improved after diuresis.  Torsemide on non-dialysis days   #A  fib: on eliquis   # fever:  - cultures drawn,  follow - vanc with HD and ceftriaxone --  now just keflex - VVS following s/p excision of AVG 07/01/23 due to concern for steal syndrome  #Disposition - hopeful discharge tomorrow after dialysis.  Benjamin Brands, MD [employer] [phone]

## 2023-07-02 NOTE — Plan of Care (Signed)
  Problem: Education: Goal: Ability to describe self-care measures that may prevent or decrease complications (Diabetes Survival Skills Education) will improve Outcome: Progressing Goal: Individualized Educational Video(s) Outcome: Progressing   Problem: Coping: Goal: Ability to adjust to condition or change in health will improve Outcome: Progressing   Problem: Fluid Volume: Goal: Ability to maintain a balanced intake and output will improve Outcome: Progressing   Problem: Health Behavior/Discharge Planning: Goal: Ability to identify and utilize available resources and services will improve Outcome: Progressing Goal: Ability to manage health-related needs will improve Outcome: Progressing   Problem: Metabolic: Goal: Ability to maintain appropriate glucose levels will improve Outcome: Progressing   Problem: Nutritional: Goal: Maintenance of adequate nutrition will improve Outcome: Progressing Goal: Progress toward achieving an optimal weight will improve Outcome: Progressing   Problem: Skin Integrity: Goal: Risk for impaired skin integrity will decrease Outcome: Progressing   Problem: Tissue Perfusion: Goal: Adequacy of tissue perfusion will improve Outcome: Progressing   Problem: Education: Goal: Knowledge of General Education information will improve Description: Including pain rating scale, medication(s)/side effects and non-pharmacologic comfort measures Outcome: Progressing   Problem: Health Behavior/Discharge Planning: Goal: Ability to manage health-related needs will improve Outcome: Progressing   Problem: Clinical Measurements: Goal: Ability to maintain clinical measurements within normal limits will improve Outcome: Progressing Goal: Will remain free from infection Outcome: Progressing Goal: Diagnostic test results will improve Outcome: Progressing Goal: Respiratory complications will improve Outcome: Progressing Goal: Cardiovascular complication will  be avoided Outcome: Progressing   Problem: Activity: Goal: Risk for activity intolerance will decrease Outcome: Progressing   Problem: Nutrition: Goal: Adequate nutrition will be maintained Outcome: Progressing   Problem: Coping: Goal: Level of anxiety will decrease Outcome: Progressing   Problem: Elimination: Goal: Will not experience complications related to bowel motility Outcome: Progressing Goal: Will not experience complications related to urinary retention Outcome: Progressing   Problem: Pain Managment: Goal: General experience of comfort will improve and/or be controlled Outcome: Progressing   Problem: Safety: Goal: Ability to remain free from injury will improve Outcome: Progressing   Problem: Skin Integrity: Goal: Risk for impaired skin integrity will decrease Outcome: Progressing   Problem: Education: Goal: Ability to demonstrate management of disease process will improve Outcome: Progressing Goal: Ability to verbalize understanding of medication therapies will improve Outcome: Progressing Goal: Individualized Educational Video(s) Outcome: Progressing   Problem: Activity: Goal: Capacity to carry out activities will improve Outcome: Progressing   Problem: Cardiac: Goal: Ability to achieve and maintain adequate cardiopulmonary perfusion will improve Outcome: Progressing   Problem: Education: Goal: Knowledge of disease and its progression will improve Outcome: Progressing   Problem: Fluid Volume: Goal: Compliance with measures to maintain balanced fluid volume will improve Outcome: Progressing   Problem: Health Behavior/Discharge Planning: Goal: Ability to manage health-related needs will improve Outcome: Progressing   Problem: Nutritional: Goal: Ability to make healthy dietary choices will improve Outcome: Progressing   Problem: Clinical Measurements: Goal: Complications related to the disease process, condition or treatment will be avoided  or minimized Outcome: Progressing

## 2023-07-02 NOTE — Progress Notes (Signed)
 PHARMACY - ANTICOAGULATION CONSULT NOTE  Pharmacy Consult for heparin Indication: hx DVT and renal vein thrombus   Allergies  Allergen Reactions   Anesthetics, Halogenated Other (See Comments)    Per patient's daughter, she can not tolerate anesthetics.    Etomidate Other (See Comments)    Heart stopped during anesthesia   Fentanyl Other (See Comments)    Heart stopped during anesthesia    Percocet [Oxycodone-Acetaminophen] Other (See Comments)    Headaches    Diazepam Other (See Comments)    Agitation   Lorazepam Nausea Only   Tramadol Hcl Nausea And Vomiting   Haloperidol Nausea And Vomiting    **Haldol**   Morphine Sulfate Nausea Only    agitation   Propoxyphene Nausea And Vomiting    **Darvocet** no longer available   Versed [Midazolam] Other (See Comments)    Heart stopped during anesthesia    Patient Measurements: Height: (P) 5\' 4"  (162.6 cm) Weight: 76.7 kg (169 lb 1.5 oz) IBW/kg (Calculated) : (P) 54.7 HEPARIN DW (KG): (P) 71  Vital Signs: Temp: 98.8 F (37.1 C) (04/15 1449) Temp Source: Oral (04/15 1449) BP: 108/41 (04/15 1449) Pulse Rate: 87 (04/15 1449)  Labs: Recent Labs    06/30/23 0920 06/30/23 1947 07/01/23 0824 07/01/23 2200 07/02/23 0339 07/02/23 1547  HGB 7.8*  --  7.2* 6.9* 7.9*  --   HCT 25.3*  --  23.0* Katie.7* 24.8*  --   PLT 211  --  223 231 214  --   APTT 43* 32 62*  --   --  78*  HEPARINUNFRC >1.10*  --  >1.10*  --   --  0.68  CREATININE 4.28*  --  4.99* 5.37*  --   --     Estimated Creatinine Clearance: 8.9 mL/min (A) (by C-G formula based on SCr of 5.37 mg/dL (H)).   Medical History: Past Medical History:  Diagnosis Date   Abdominal discomfort    Chronic N/V/D. Presumptive dx Crohn's dx per elevated p ANCA. Failed Entocort and Pentasa. Sep 2003 - ileocolectomy c anastomosis per Dr Orson Slick 2/2 adhesions - path was hegative for Crohns. EGD, Sm bowel follow through (11/03), and an eteroclysis (10/03) were unrevealing. Cuases  hypomag and hypocalcemia.   Adnexal mass 10/2001   s/p lap BSO (R ovarian fibroma) & lysis of adhesions   Allergy    Seasonal   Anemia    Multifactorial. Baseline HgB 10-11 ish. B12 def - 150 in 3/10. Fe Def - ferritin 35 3/10. Both are being repleted.   Breast cancer (HCC) 03/16/2013   right, 5 o'clock   CAD (coronary artery disease) 1996   1996 - PTCA and angioplasty diagonal branch. 2000 - Rotoblator & angiopllasty of diagonal. 2006 - subendocardial AMI, DES to proximal LAD.Marland Kitchen Also had 90% stenosis in distal apical LAD. EF 55 with apical hypokinesis. Indefinite ASA and Plavix.   CHF (congestive heart failure) (HCC)    Chronic kidney disease, stage 4 (severe) (HCC)    Chronic pain    CT 10/10 = Spinal stenosis L2 - S1.   COVID-19 11/02/2020   Diabetes mellitus    Insulin dependent   Diabetes mellitus type 2 in obese 05/03/2010   Managed on lantus and novolog. Has diabetic nephropathy. Metformin D/C'd 2012 2/2 creatinine.  No diabetic retinopathy per 3/11.    Gout    Hx of radiation therapy 06/02/13- 07/16/13   right rbeast 4500 cGy 25 sessions, right breast boost 1600 cGy in 8 sessions   Hyperlipidemia  Managed with both a statin and Welchol. Welchol stopped 2014 2/2 cost and started on fenofibrate    Hypertension    2006 B renal arteries patent. 2003 MRA - no RAS. 2003 pheo W/U Dr Francetta Innocent reportedly negative.   Hypoxia 07/23/2017   Lupus    Lymphedema of breast    Personal history of radiation therapy 2015   Pneumonia of left lung due to infectious organism    RBBB    Renal vein thrombosis (HCC)    SBO (small bowel obstruction) (HCC) 09/17/2017   Secondary hyperparathyroidism (HCC)    Stroke [facility])    Incidental finding MRI 2002 L lacunar infarct   Vitamin B12 deficiency    Vitamin D deficiency    Wears dentures    top      Assessment: 78 yo [name] on apixaban PTA for  hx DVT and renal vein thrombus admitted for AV graft steal syndrome and now s/p AV graft excision.  Pharmacy consulted for heparin to start 4/15 per Dr. Fulton Job  Heparin level is not correlating with aPTT. APTT is 78 and therapeutic on 1650 units/hr.  Goal of Therapy:  Heparin level 0.3-0.7 units/ml aPTT 66-102 seconds Monitor platelets by anticoagulation protocol: Yes   Plan:  Heparin at 1650 units/hr  F/u aPTT until correlates with heparin level  Monitor daily aPTT, heparin level, CBC, signs/symptoms of bleeding  F/u restart apixaban   Dorene Gang, PharmD, BCPS, [facility] Clinical Pharmacist  Please check AMION for all [facility] Pharmacy phone numbers After 10:00 PM, call Main Pharmacy [phone]

## 2023-07-02 NOTE — Plan of Care (Signed)
  Problem: Coping: Goal: Ability to adjust to condition or change in health will improve Outcome: Progressing   Problem: Health Behavior/Discharge Planning: Goal: Ability to identify and utilize available resources and services will improve Outcome: Progressing   Problem: Metabolic: Goal: Ability to maintain appropriate glucose levels will improve Outcome: Progressing   Problem: Nutritional: Goal: Maintenance of adequate nutrition will improve Outcome: Progressing   Problem: Education: Goal: Knowledge of General Education information will improve Description: Including pain rating scale, medication(s)/side effects and non-pharmacologic comfort measures Outcome: Progressing   Problem: Fluid Volume: Goal: Ability to maintain a balanced intake and output will improve Outcome: Not Progressing   Problem: Nutritional: Goal: Progress toward achieving an optimal weight will improve Outcome: Not Progressing   Problem: Skin Integrity: Goal: Risk for impaired skin integrity will decrease Outcome: Not Progressing   Problem: Health Behavior/Discharge Planning: Goal: Ability to manage health-related needs will improve Outcome: Not Progressing

## 2023-07-02 NOTE — Progress Notes (Signed)
 [facility] [place] [facility]  Subjective  -states her left hand feels better   Objective (!) 138/51 88 99 F (37.2 C) (Oral) 18 100%  Intake/Output Summary (Last 24 hours) at 07/02/2023 0630 Last data filed at 07/01/2023 2147 Gross per 24 hour  Intake 320 ml  Output --  Net 320 ml    Left radial pulse palpable  Laboratory Lab Results: Recent Labs    07/01/23 2200 07/02/23 0339  WBC 10.2 14.6*  HGB 6.9* 7.9*  HCT 22.7* 24.8*  PLT 231 214   BMET Recent Labs    07/01/23 0824 07/01/23 2200  NA 136 134*  K 5.3* 5.4*  CL 99 101  CO2 24 22  GLUCOSE 87 162*  BUN 83* 85*  CREATININE 4.99* 5.37*  CALCIUM 8.9 8.7*    COAG Lab Results  Component Value Date   INR 1.5 (H) 07/28/2022   INR 1.3 (H) 02/03/2022   INR 1.3 (H) 11/05/2021   No results found for: "PTT"  Assessment/Planning:  Postop day 1 status post removal of left upper arm AV graft with vein patch of the brachial artery for steal syndrome.  Left radial pulse now palpable.  States her hand symptoms have improved.  Will leave her Ace wrap 1 more day as she does have significant edema prior to graft excision.  Overall looks good.  Can restart heparin today as discussed with pharmacy.  Discussed she elevate her arm at bedside and I did get her several pillows to do this.  Young Hensen 07/02/2023 6:30 AM --

## 2023-07-02 NOTE — Progress Notes (Signed)
 PROGRESS NOTE    [name]  ZOX:[id number]  DOB: [birth year]  DOA: 06/15/2023 PCP: Renford Dills, MD Outpatient Specialists:   Hospital course:  77/F with CAD, chronic diastolic CHF, CKD 4, history of DVT, renal vein thrombosis on Eliquis, CVA, depression, memory loss, type 2 diabetes mellitus, history of breast cancer, lupus was admitted 3/8-15 with volume overload, progressive CKD, followed by nephrology, diuresed and discharged home on oral diuretics, also seen by gastroenterology for worsening chronic anemia, EGD and colonoscopy were unremarkable, Eliquis resumed at discharge. -Back in the ED 3/29 with progressive dyspnea on exertion orthopnea lower extremity edema and weight gain. -In the ED hypertensive, otherwise vital signs stable, hemoglobin 7.0, creatinine 4.0, BUN 82, BNP 560, troponin 125, chest x-ray unremarkable -Admitted, started on IV diuretics, nephrology following -4/2, vascular surgery consult, plan to start HD -4/4: tunneled HD cath placed, left arm AV graft -4/7: low BP, DC held, ? Infection at AVG site -4/8: started Abx -4/9-10 onwards, persistent severe left arm pain -4/12 vascular surgery consulted, plan for AV graft excision -4/14 s/p excision of AV graft  - 4/15 new leukocytosis, body aches, temp 99.0 fever workup initiated, started empirically on Vanco and Zosyn  Subjective:  Patient states she is very tired and sleepy.  Family notes that she is complaining of bodyaches and generally does not feel well.  She has been having a cough.   Objective: Vitals:   07/02/23 0245 07/02/23 0313 07/02/23 0853 07/02/23 1225  BP: (!) 141/72 (!) 138/51 (!) 138/50   Pulse: 88 88 81 82  Resp: (!) 9 18 18 18   Temp:  99 F (37.2 C) 98.6 F (37 C) 99 F (37.2 C)  TempSrc:  Oral Oral Oral  SpO2: 100% 100% 100% 100%  Weight:      Height:        Intake/Output Summary (Last 24 hours) at 07/02/2023 1232 Last data filed at 07/01/2023 2147 Gross per 24 hour   Intake 320 ml  Output --  Net 320 ml   Filed Weights   06/30/23 0553 07/01/23 0428 07/02/23 0013  Weight: 75.5 kg 76.7 kg 76.7 kg     Exam:  General: Patient is very tired and sleepy this morning but arousable by voice alone and is appropriate Eyes: sclera anicteric, conjuctiva mild injection bilaterally CVS: S1-S2, regular  Respiratory:  decreased air entry bilaterally secondary to decreased inspiratory effort, rales at bases  GI: NABS, soft, NT  LE: LUE with some erythema and edema graft site Neuro: A/O x 3,  grossly nonfocal.    Data Reviewed:  Basic Metabolic Panel: Recent Labs  Lab 06/26/23 0219 06/28/23 0253 06/29/23 0412 06/30/23 0920 07/01/23 0824 07/01/23 2200  NA 134* 137 135 134* 136 134*  K 3.7 4.3 4.1 5.1 5.3* 5.4*  CL 98 100 98 99 99 101  CO2 23 23 26 24 24 22   GLUCOSE 139* 139* 107* 140* 87 162*  BUN 67* 65* 40* 64* 83* 85*  CREATININE 4.76* 5.09* 3.30* 4.28* 4.99* 5.37*  CALCIUM 8.4* 8.6* 8.8* 8.7* 8.9 8.7*  PHOS 3.1  --   --   --   --  5.1*    CBC: Recent Labs  Lab 06/26/23 0219 06/27/23 0748 06/29/23 0412 06/30/23 0920 07/01/23 0824 07/01/23 2200 07/02/23 0339  WBC 11.9*   < > 8.6 10.3 11.2* 10.2 14.6*  NEUTROABS 9.1*  --   --   --   --  8.3*  --   HGB 7.6*   < >  7.9* 7.8* 7.2* 6.9* 7.9*  HCT 24.6*   < > 25.3* 25.3* 23.0* 22.7* 24.8*  MCV 93.5   < > 92.3 93.0 92.4 95.4 92.2  PLT 186   < > 199 211 223 231 214   < > = values in this interval not displayed.     Scheduled Meds:  Chlorhexidine Gluconate Cloth  6 each Topical Q0600   colestipol  1 g Oral TID   darbepoetin (ARANESP) injection - DIALYSIS  150 mcg Subcutaneous Q Sun-1800   FLUoxetine  20 mg Oral Daily   folic acid  1 mg Oral Daily   gabapentin  300 mg Oral QHS   insulin aspart  0-6 Units Subcutaneous TID WC   lipase/protease/amylase  36,000 Units Oral TID AC   mirtazapine  7.5 mg Oral QHS   multivitamin  1 tablet Oral QHS   pantoprazole  40 mg Oral BID    polyethylene glycol  17 g Oral Daily   rivastigmine  1.5 mg Oral BID   sevelamer carbonate  800 mg Oral TID WC   torsemide  100 mg Oral Q T,Th,S,Su   Continuous Infusions:  heparin 1,650 Units/hr (07/02/23 0803)     Assessment & Plan:   Leukocytosis Malaise/lethargy S/p A/V graft removal yesterday  Patient with change in clinical status after having AV graft pulled yesterday Temperature is 99.0 although does have new elevated white count at 15 Will do fever workup including blood cultures, chest x-ray and UA Will need to follow-up on tip culture as well Start Vanco and Zosyn empirically, she had been on this last week and had been changed to oral Keflex  Left arm AV graft complicated by steal syndrome S/p excision yesterday  Progressive CKD/ESRD Being followed closely by nephrology for hemodialysis as warranted  Acute on chronic HFpEF Doing well with hemodialysis Continue torsemide 100 QOD  Gout flare, left foot Resolved with 3 days of prednisone Would consider longer course of prednisone if pain recurs  VTE  H/o renal vein thrombosis H/o DVT On heparin drip  Mild dementia Depression Continue fluoxetine and rivastigmine  Early DM2 SSI A1c 6.2 in February    DVT prophylaxis: On IV heparin Code Status: Full Family Communication: Spoke with patient's daughter over the phone     Studies: No results found.  Principal Problem:   Acute on chronic diastolic CHF (congestive heart failure) (HCC) Active Problems:   Chronic kidney disease (CKD), stage V (HCC)   Essential hypertension   Acute on chronic anemia   GERD (gastroesophageal reflux disease)   Type 2 diabetes mellitus (HCC)   Gout flare   CAD (coronary artery disease)   Depression   Breast cancer of lower-inner quadrant of right female breast (HCC)     Darryle Dennie Rollene Clink, [facility]  If 7PM-7AM, please contact night-coverage [email or web address]   LOS: 16 days

## 2023-07-02 NOTE — Progress Notes (Signed)
 Pharmacy Antibiotic Note  [name] is a 78 y.o. female admitted on 06/15/2023 with AV fistula cellulitis.  Pharmacy has been consulted for vancomycin and zosyn dosing.  Of note, received ~2 days of Vanc/Rocephin 4/8 for AV fistula cellulitis, and transitioned to Keflex (D#5 today) after negative blood cultures. S/p AV graft excision 4/14 (steal syndrome) WBC 14.6, afebrile. Endorsing body aches and malaise.  Plan: Start Zosyn 2.25g IV every 8h Start vancomycin 1500 mg IV once, then plan for 750 mg with iHD Monitor HD schedule, cx results, clinical status, and vanc levels as appropriate  Height: (P) 5\' 4"  (162.6 cm) Weight: 76.7 kg (169 lb 1.5 oz) IBW/kg (Calculated) : (P) 54.7  Temp (24hrs), Avg:98.7 F (37.1 C), Min:98.5 F (36.9 C), Max:99 F (37.2 C)  Recent Labs  Lab 06/28/23 0253 06/29/23 0412 06/30/23 0920 07/01/23 0824 07/01/23 2200 07/02/23 0339  WBC 9.6 8.6 10.3 11.2* 10.2 14.6*  CREATININE 5.09* 3.30* 4.28* 4.99* 5.37*  --     Estimated Creatinine Clearance: 8.9 mL/min (A) (by C-G formula based on SCr of 5.37 mg/dL (H)).    Allergies  Allergen Reactions   Anesthetics, Halogenated Other (See Comments)    Per patient's daughter, she can not tolerate anesthetics.    Etomidate Other (See Comments)    Heart stopped during anesthesia   Fentanyl Other (See Comments)    Heart stopped during anesthesia    Percocet [Oxycodone-Acetaminophen] Other (See Comments)    Headaches    Diazepam Other (See Comments)    Agitation   Lorazepam Nausea Only   Tramadol Hcl Nausea And Vomiting   Haloperidol Nausea And Vomiting    **Haldol**   Morphine Sulfate Nausea Only    agitation   Propoxyphene Nausea And Vomiting    **Darvocet** no longer available   Versed [Midazolam] Other (See Comments)    Heart stopped during anesthesia    Antimicrobials this admission: Zosyn 4/15 >> Vanc 4/15 >>   Dose adjustments this admission: N/A  Microbiology results: 4/8  BCx: negative 4/15 BCx: sent 4/15 MRSA PCR: sent  Thank you for allowing pharmacy to participate in this patient's care,  Cecillia Cogan, PharmD Clinical Pharmacist 07/02/2023  1:55 PM

## 2023-07-02 NOTE — Progress Notes (Incomplete)
 Pharmacy Antibiotic Note  [name] is a 78 y.o. female admitted on 06/15/2023 with AV fistula cellulitis.  Pharmacy has been consulted for vancomycin dosing.   WBC 12, afebrile. Concern for cellulitis around site of new AVF fistula - tender to palpitation and local erythema.   Plan: Start ceftriaxone 2g IV every 24 hours Start vancomycin 1500 mg IV once then plan for 750 mg with iHD Monitor HD schedule, cx results, clinical pic, and vanc levels as appropriate  Height: (P) 5\' 4"  (162.6 cm) Weight: 76.7 kg (169 lb 1.5 oz) IBW/kg (Calculated) : (P) 54.7  Temp (24hrs), Avg:98.7 F (37.1 C), Min:98.5 F (36.9 C), Max:99 F (37.2 C)  Recent Labs  Lab 06/28/23 0253 06/29/23 0412 06/30/23 0920 07/01/23 0824 07/01/23 2200 07/02/23 0339  WBC 9.6 8.6 10.3 11.2* 10.2 14.6*  CREATININE 5.09* 3.30* 4.28* 4.99* 5.37*  --     Estimated Creatinine Clearance: 8.9 mL/min (A) (by C-G formula based on SCr of 5.37 mg/dL (H)).    Allergies  Allergen Reactions   Anesthetics, Halogenated Other (See Comments)    Per patient's daughter, she can not tolerate anesthetics.    Etomidate Other (See Comments)    Heart stopped during anesthesia   Fentanyl Other (See Comments)    Heart stopped during anesthesia    Percocet [Oxycodone-Acetaminophen] Other (See Comments)    Headaches    Diazepam Other (See Comments)    Agitation   Lorazepam Nausea Only   Tramadol Hcl Nausea And Vomiting   Haloperidol Nausea And Vomiting    **Haldol**   Morphine Sulfate Nausea Only    agitation   Propoxyphene Nausea And Vomiting    **Darvocet** no longer available   Versed [Midazolam] Other (See Comments)    Heart stopped during anesthesia    Antimicrobials this admission: Ceftriaxone 4/8 >>  Vanc 4/8 >>   Dose adjustments this admission: N/A  Microbiology results: 4/8 BCx: ngtd   Thank you for allowing pharmacy to participate in this patient's care,  Cecillia Cogan, PharmD Clinical  Pharmacist 07/02/2023  12:45 PM

## 2023-07-03 DIAGNOSIS — N185 Chronic kidney disease, stage 5: Secondary | ICD-10-CM | POA: Diagnosis not present

## 2023-07-03 DIAGNOSIS — D649 Anemia, unspecified: Secondary | ICD-10-CM | POA: Diagnosis not present

## 2023-07-03 DIAGNOSIS — I10 Essential (primary) hypertension: Secondary | ICD-10-CM | POA: Diagnosis not present

## 2023-07-03 DIAGNOSIS — I5033 Acute on chronic diastolic (congestive) heart failure: Secondary | ICD-10-CM | POA: Diagnosis not present

## 2023-07-03 LAB — HEPARIN LEVEL (UNFRACTIONATED): Heparin Unfractionated: 0.71 [IU]/mL — ABNORMAL HIGH (ref 0.30–0.70)

## 2023-07-03 LAB — RENAL FUNCTION PANEL
Albumin: 2 g/dL — ABNORMAL LOW (ref 3.5–5.0)
Anion gap: 13 (ref 5–15)
BUN: 68 mg/dL — ABNORMAL HIGH (ref 8–23)
CO2: 21 mmol/L — ABNORMAL LOW (ref 22–32)
Calcium: 8.5 mg/dL — ABNORMAL LOW (ref 8.9–10.3)
Chloride: 101 mmol/L (ref 98–111)
Creatinine, Ser: 4.81 mg/dL — ABNORMAL HIGH (ref 0.44–1.00)
GFR, Estimated: 9 mL/min — ABNORMAL LOW (ref >60)
Glucose, Bld: 119 mg/dL — ABNORMAL HIGH (ref 70–99)
Phosphorus: 5.1 mg/dL — ABNORMAL HIGH (ref 2.5–4.6)
Potassium: 4.4 mmol/L (ref 3.5–5.1)
Sodium: 135 mmol/L (ref 135–145)

## 2023-07-03 LAB — APTT: aPTT: 96 s — ABNORMAL HIGH (ref 24–36)

## 2023-07-03 LAB — CBC
HCT: 21.6 % — ABNORMAL LOW (ref 36.0–46.0)
Hemoglobin: 6.6 g/dL — CL (ref 12.0–15.0)
MCH: 28.3 pg (ref 26.0–34.0)
MCHC: 30.6 g/dL (ref 30.0–36.0)
MCV: 92.7 fL (ref 80.0–100.0)
Platelets: 226 10*3/uL (ref 150–400)
RBC: 2.33 MIL/uL — ABNORMAL LOW (ref 3.87–5.11)
RDW: 17.5 % — ABNORMAL HIGH (ref 11.5–15.5)
WBC: 12.1 10*3/uL — ABNORMAL HIGH (ref 4.0–10.5)
nRBC: 0.2 % (ref 0.0–0.2)

## 2023-07-03 LAB — GLUCOSE, CAPILLARY
Glucose-Capillary: 115 mg/dL — ABNORMAL HIGH (ref 70–99)
Glucose-Capillary: 166 mg/dL — ABNORMAL HIGH (ref 70–99)
Glucose-Capillary: 165 mg/dL — ABNORMAL HIGH (ref 70–99)
Glucose-Capillary: 173 mg/dL — ABNORMAL HIGH (ref 70–99)

## 2023-07-03 LAB — HEMOGLOBIN AND HEMATOCRIT, BLOOD
HCT: 25.5 % — ABNORMAL LOW (ref 36.0–46.0)
Hemoglobin: 8.2 g/dL — ABNORMAL LOW (ref 12.0–15.0)

## 2023-07-03 LAB — PREPARE RBC (CROSSMATCH)

## 2023-07-03 MED ORDER — HEPARIN SODIUM (PORCINE) 1000 UNIT/ML IJ SOLN
3200.0000 [IU] | Freq: Once | INTRAMUSCULAR | Status: AC
  Administered 2023-07-03: 3200 [IU]

## 2023-07-03 MED ORDER — HEPARIN SODIUM (PORCINE) 1000 UNIT/ML IJ SOLN
INTRAMUSCULAR | Status: AC
Start: 2023-07-03 — End: 2023-07-04
  Filled 2023-07-03: qty 4

## 2023-07-03 MED ORDER — SODIUM CHLORIDE 0.9% IV SOLUTION: Freq: Once | INTRAVENOUS | Status: DC

## 2023-07-03 NOTE — Anesthesia Postprocedure Evaluation (Signed)
 Anesthesia Post Note  Patient: [name]  Procedure(s) Performed: REMOVAL OF ARTERIOVENOUS GORETEX GRAFT (AVGG) (Left) REPAIR, ARTERY, BRACHIAL USING VEIN PATCH (Left: Arm Upper)     Patient location during evaluation: PACU Anesthesia Type: General Level of consciousness: awake and alert Pain management: pain level controlled Vital Signs Assessment: post-procedure vital signs reviewed and stable Respiratory status: spontaneous breathing, nonlabored ventilation, respiratory function stable and patient connected to nasal cannula oxygen Cardiovascular status: blood pressure returned to baseline and stable Postop Assessment: no apparent nausea or vomiting Anesthetic complications: no   No notable events documented.  Last Vitals:  Vitals:   07/03/23 1632 07/03/23 1655  BP: (!) 147/67 (!) 128/56  Pulse: 91 92  Resp: 13 18  Temp:  37.2 C  SpO2: 100% 100%    Last Pain:  Vitals:   07/03/23 1932  TempSrc:   PainSc: 0-No pain                 Evart Mcdonnell S

## 2023-07-03 NOTE — Progress Notes (Signed)
 PHARMACY - ANTICOAGULATION CONSULT NOTE  Pharmacy Consult for heparin Indication: hx DVT and renal vein thrombus   Allergies  Allergen Reactions   Anesthetics, Halogenated Other (See Comments)    Per patient's daughter, she can not tolerate anesthetics.    Etomidate Other (See Comments)    Heart stopped during anesthesia   Fentanyl Other (See Comments)    Heart stopped during anesthesia    Percocet [Oxycodone-Acetaminophen] Other (See Comments)    Headaches    Diazepam Other (See Comments)    Agitation   Lorazepam Nausea Only   Tramadol Hcl Nausea And Vomiting   Haloperidol Nausea And Vomiting    **Haldol**   Morphine Sulfate Nausea Only    agitation   Propoxyphene Nausea And Vomiting    **Darvocet** no longer available   Versed [Midazolam] Other (See Comments)    Heart stopped during anesthesia    Patient Measurements: Height: (P) 5\' 4"  (162.6 cm) Weight: 76.7 kg (169 lb 1.5 oz) IBW/kg (Calculated) : (P) 54.7 HEPARIN DW (KG): (P) 71  Vital Signs: Temp: 99.6 F (37.6 C) (04/16 0540) Temp Source: Oral (04/16 0540) BP: 163/68 (04/16 0540) Pulse Rate: 94 (04/16 0540)  Labs: Recent Labs    07/01/23 0824 07/01/23 2200 07/02/23 0339 07/02/23 1547 07/03/23 0507  HGB 7.2* 6.9* 7.9*  --   --   HCT 23.0* 22.7* 24.8*  --   --   PLT 223 231 214  --   --   APTT 62*  --   --  78* 96*  HEPARINUNFRC >1.10*  --   --  0.68 0.71*  CREATININE 4.99* 5.37*  --   --  4.81*    Estimated Creatinine Clearance: 10 mL/min (A) (by C-G formula based on SCr of 4.81 mg/dL (H)).   Medical History: Past Medical History:  Diagnosis Date   Abdominal discomfort    Chronic N/V/D. Presumptive dx Crohn's dx per elevated p ANCA. Failed Entocort and Pentasa. Sep 2003 - ileocolectomy c anastomosis per Dr Duwaine Gins 2/2 adhesions - path was hegative for Crohns. EGD, Sm bowel follow through (11/03), and an eteroclysis (10/03) were unrevealing. Cuases hypomag and hypocalcemia.   Adnexal mass  10/2001   s/p lap BSO (R ovarian fibroma) & lysis of adhesions   Allergy    Seasonal   Anemia    Multifactorial. Baseline HgB 10-11 ish. B12 def - 150 in 3/10. Fe Def - ferritin 35 3/10. Both are being repleted.   Breast cancer (HCC) 03/16/2013   right, 5 o'clock   CAD (coronary artery disease) 1996   1996 - PTCA and angioplasty diagonal branch. 2000 - Rotoblator & angiopllasty of diagonal. 2006 - subendocardial AMI, DES to proximal LAD.Aaron Aas Also had 90% stenosis in distal apical LAD. EF 55 with apical hypokinesis. Indefinite ASA and Plavix.   CHF (congestive heart failure) (HCC)    Chronic kidney disease, stage 4 (severe) (HCC)    Chronic pain    CT 10/10 = Spinal stenosis L2 - S1.   COVID-19 11/02/2020   Diabetes mellitus    Insulin dependent   Diabetes mellitus type 2 in obese 05/03/2010   Managed on lantus and novolog. Has diabetic nephropathy. Metformin D/C'd 2012 2/2 creatinine.  No diabetic retinopathy per 3/11.    Gout    Hx of radiation therapy 06/02/13- 07/16/13   right rbeast 4500 cGy 25 sessions, right breast boost 1600 cGy in 8 sessions   Hyperlipidemia    Managed with both a statin and Welchol. Welchol stopped 2014  2/2 cost and started on fenofibrate    Hypertension    2006 B renal arteries patent. 2003 MRA - no RAS. 2003 pheo W/U Dr Francetta Innocent reportedly negative.   Hypoxia 07/23/2017   Lupus    Lymphedema of breast    Personal history of radiation therapy 2015   Pneumonia of left lung due to infectious organism    RBBB    Renal vein thrombosis (HCC)    SBO (small bowel obstruction) (HCC) 09/17/2017   Secondary hyperparathyroidism (HCC)    Stroke [facility])    Incidental finding MRI 2002 L lacunar infarct   Vitamin B12 deficiency    Vitamin D deficiency    Wears dentures    top      Assessment: 78 yo female on apixaban PTA for  hx DVT and renal vein thrombus admitted for AV graft steal syndrome and now s/p AV graft excision. Pharmacy consulted for heparin to start 4/15 per  Dr. Fulton Job  Heparin level is not correlating with aPTT. APTT is 96 and therapeutic on 1650 units/hr.  Goal of Therapy:  Heparin level 0.3-0.7 units/ml aPTT 66-102 seconds Monitor platelets by anticoagulation protocol: Yes   Plan:  Heparin at 1650 units/hr  F/u aPTT until correlates with heparin level  Monitor daily aPTT, heparin level, CBC, signs/symptoms of bleeding  F/u restart apixaban   Lovina Ruddle, PharmD, [facility] Clinical Pharmacist Please see AMION for all Pharmacists' Contact Phone Numbers 07/03/2023, 7:30 AM

## 2023-07-03 NOTE — Progress Notes (Addendum)
 Received patient in bed to unit.  Alert and oriented.  Informed consent signed and in chart.   TX duration: 3 hours, changed per Dr. Irene Mannheim due to high census in dialysis unit.  Patient tolerated well.  Transported back to the room  Alert, without acute distress.  Hand-off given to patient's nurse.   Access used: R internal jugular HD Cath Access issues: A-V and V-A  Total UF removed: 2.5L Medication(s) given: Vancomycin, Tylenol, 1 Unit of Blood   07/03/23 1617  Vitals  Temp 99.4 F (37.4 C)  Temp Source Oral  BP 123/61  Pulse Rate 85  Resp 17  Oxygen Therapy  SpO2 100 %  O2 Device Room Air  Patient Activity (if Appropriate) In bed  Pulse Oximetry Type Continuous  Oximetry Probe Site Changed No  During Treatment Monitoring  Dialysate Potassium Concentration 2  Dialysate Calcium Concentration 2.5  Duration of HD Treatment -hour(s) 3 hour(s)  HD Safety Checks Performed Yes  Intra-Hemodialysis Comments Tx completed;Tolerated well  Dialysis Fluid Bolus Normal Saline  Bolus Amount (mL) 300 mL  Post Treatment  Dialyzer Clearance Clear  Liters Processed 66.7  Fluid Removed (mL) 2500 mL  Tolerated HD Treatment Yes  Hemodialysis Catheter Right Internal jugular Double lumen Permanent (Tunneled)  Placement Date/Time: 06/21/23 1335   Serial / Lot #: [id number]  Expiration Date: 12/14/27  Time Out: Correct patient;Correct site;Correct procedure  Maximum sterile barrier precautions: Hand hygiene;Large sterile sheet;Cap;Sterile probe cover;Mask;St...  Site Condition No complications  Blue Lumen Status Flushed;Heparin locked;Dead end cap in place  Red Lumen Status Infusing;Heparin locked;Dead end cap in place  Purple Lumen Status N/A  Catheter fill solution Heparin 1000 units/ml  Catheter fill volume (Arterial) 1.6 cc  Catheter fill volume (Venous) 1.6  Dressing Type Transparent  Dressing Status Antimicrobial disc/dressing in place;Clean, Dry, Intact  Drainage Description  None  Dressing Change Due 07/08/23  Post treatment catheter status Capped and Clamped     Luciano Ruths LPN [facility]

## 2023-07-03 NOTE — Progress Notes (Signed)
 Nutrition Follow-up  DOCUMENTATION CODES:   Not applicable  INTERVENTION:  Continue liberalized diet to promote PO intake Continue Rena-vite daily  NUTRITION DIAGNOSIS:  Increased nutrient needs related to other (see comment) (New ESRD/HD) as evidenced by estimated needs. - remains applicable  GOAL:  Patient will meet greater than or equal to 90% of their needs - progressing  MONITOR:  PO intake, Supplement acceptance  REASON FOR ASSESSMENT:  Consult Assessment of nutrition requirement/status  ASSESSMENT:   PMH of HTN, CAD, CKD stage 5, DVT, chronic HFpEF, memory loss, T2DM, HLD, gout, SBO, history of breast cancer, lupus. Patient was recently admitted 3/8-3/15 for volume overload/acute on chronic HFpEF/progressive CKD stage V versus AKI on CKD. Pt admitted for worsening dyspnea on exertion, bilateral lower extremity edema, and 20# weight gain since last admission.   3/29: admitted, started on IV diuretics 4/2, vascular surgery consult 4/4: tunneled HD cath placed, left arm AV graft, HD session #1 4/7: ? Infection at AVG site 4/8: started Abx 4/9-10 onwards, persistent severe left arm pain 4/12 vascular surgery consulted, plan for AV graft excision 4/14 s/p excision of AV graft  4/15 new leukocytosis, body aches, temp 99.0 fever workup initiated, started empirically on Vanco and Zosyn   Continues with some pain and swelling s/p AVG excision. Some change in clinical status noted yesterday c/f infection s/p procedure. IV ABX started.   Average Meal Intake 4/10: 100% x3 documented meals 4/11: 100% x1 documented meal 4/13: 100% x1 documented meal  Intake is adequate. Not open to nutrition supplementation. Continue to reiterate nutrition education and follow up at outpatient dialysis center. RD will be able to customize diet recommendations based off patient specific labs and medical hx.    Admit Weight: 79kg Current Weight: 76.7kg  Wt stable since admission and in line  with UBW over last year, per chart review.  Edema to LUE continues, however improved in BLEs.    Intake/Output Summary (Last 24 hours) at 07/03/2023 0930 Last data filed at 07/03/2023 0300 Gross per 24 hour  Intake 358.72 ml  Output 200 ml  Net 158.72 ml    Net IO Since Admission: -10,120.39 mL [07/03/23 0930]   Drains/Lines: RIJ (placed 4/4) UOP: x24 hours  Meds: darbepoetin alfa, folic acid, gabapentin, SSI 0-6 TID, Creon, mirtazapine, renal MVI, pantoprazole, Miralax, sevelamer carbonate, torsemide Drips: IV ABX  Labs:  Hgb 6.9>7.9>6.6 (L) Na+ 136>134>135 (wdl) K+ 5.3>5.4>4.4 (wdl) PHOS 5.1 (H) Mg 1.3>0.9>1.9 (wdl) WBC 10.2>14.6>12.1 (H) CBGs 119-162 x24 hours A1c 6.2 (04/2023)  Diet Order:   Diet Order             Diet renal with fluid restriction Fluid consistency: Thin  Diet effective now           Diet - low sodium heart healthy             EDUCATION NEEDS:  Education needs have been addressed  Skin:  Skin Assessment: Reviewed RN Assessment  Last BM:  4/15 - type 5 x2  Height:  Ht Readings from Last 1 Encounters:  06/21/23 (P) 5\' 4"  (1.626 m)   Weight:  Wt Readings from Last 1 Encounters:  07/02/23 76.7 kg   Ideal Body Weight:  54.5 kg  BMI:  Body mass index is 29.02 kg/m (pended).  Estimated Nutritional Needs:   Kcal:  1600-1800  Protein:  80-100  Fluid:  >1.6 L or per MD  Myrtie Cruise MS, RD, LDN Registered Dietitian Clinical Nutrition RD Inpatient Contact Info in  Amion

## 2023-07-03 NOTE — Progress Notes (Signed)
 Patient ID: [name], female   DOB: Jun 05, 1945, 78 y.o.   MRN: [id number] S: Left arm still a little sore O:BP (!) 139/45 (BP Location: Right Leg)   Pulse 97   Temp 99 F (37.2 C) (Oral)   Resp 18   Ht (P) 5\' 4"  (1.626 m)   Wt 76.7 kg   SpO2 100%   BMI (P) 29.02 kg/m   Intake/Output Summary (Last 24 hours) at 07/03/2023 1053 Last data filed at 07/03/2023 0947 Gross per 24 hour  Intake 358.72 ml  Output 450 ml  Net -91.28 ml   Intake/Output: I/O last 3 completed shifts: In: 478.7 [P.O.:120; I.V.:308.7; IV Piggyback:50] Out: 200 [Urine:200]  Intake/Output this shift:  Total I/O In: -  Out: 250 [Urine:250] Weight change:  Gen: NAD CVS: RRR Resp:CTA Abd: +BS, soft, NT/ND Ext: 1+ LUE edema  Recent Labs  Lab 06/28/23 0253 06/29/23 0412 06/30/23 0920 07/01/23 0824 07/01/23 2200 07/03/23 0507  NA 137 135 134* 136 134* 135  K 4.3 4.1 5.1 5.3* 5.4* 4.4  CL 100 98 99 99 101 101  CO2 23 26 24 24 22  21*  GLUCOSE 139* 107* 140* 87 162* 119*  BUN 65* 40* 64* 83* 85* 68*  CREATININE 5.09* 3.30* 4.28* 4.99* 5.37* 4.81*  ALBUMIN  --   --   --   --  2.1* 2.0*  CALCIUM 8.6* 8.8* 8.7* 8.9 8.7* 8.5*  PHOS  --   --   --   --  5.1* 5.1*   Liver Function Tests: Recent Labs  Lab 07/01/23 2200 07/03/23 0507  ALBUMIN 2.1* 2.0*   No results for input(s): "LIPASE", "AMYLASE" in the last 168 hours. No results for input(s): "AMMONIA" in the last 168 hours. CBC: Recent Labs  Lab 06/30/23 0920 07/01/23 0824 07/01/23 2200 07/02/23 0339 07/03/23 0757  WBC 10.3 11.2* 10.2 14.6* 12.1*  NEUTROABS  --   --  8.3*  --   --   HGB 7.8* 7.2* 6.9* 7.9* 6.6*  HCT 25.3* 23.0* 22.7* 24.8* 21.6*  MCV 93.0 92.4 95.4 92.2 92.7  PLT 211 223 231 214 226   Cardiac Enzymes: No results for input(s): "CKTOTAL", "CKMB", "CKMBINDEX", "TROPONINI" in the last 168 hours. CBG: Recent Labs  Lab 07/02/23 0546 07/02/23 1113 07/02/23 1152 07/02/23 1629 07/03/23 0748  GLUCAP 121* 106* 100*  109* 115*    Iron Studies: No results for input(s): "IRON", "TIBC", "TRANSFERRIN", "FERRITIN" in the last 72 hours. Studies/Results: DG CHEST PORT 1 VIEW Result Date: 07/02/2023 CLINICAL DATA:  Cough, leukocytosis EXAM: PORTABLE CHEST 1 VIEW COMPARISON:  06/22/2023 FINDINGS: Right dialysis catheter tip in the SVC. Heart and mediastinal contours are within normal limits. No focal opacities or effusions. No acute bony abnormality. Lucency in the left axilla soft tissues, question gas. IMPRESSION: Question gas in the left axillary soft tissues. No acute cardiopulmonary disease. Electronically Signed   By: Charlett Nose M.D.   On: 07/02/2023 17:46    sodium chloride   Intravenous Once   Chlorhexidine Gluconate Cloth  6 each Topical Q0600   colestipol  1 g Oral TID   darbepoetin (ARANESP) injection - DIALYSIS  150 mcg Subcutaneous Q Sun-1800   FLUoxetine  20 mg Oral Daily   folic acid  1 mg Oral Daily   gabapentin  300 mg Oral QHS   insulin aspart  0-6 Units Subcutaneous TID WC   lipase/protease/amylase  36,000 Units Oral TID AC   mirtazapine  7.5 mg Oral QHS  multivitamin  1 tablet Oral QHS   pantoprazole  40 mg Oral BID   polyethylene glycol  17 g Oral Daily   rivastigmine  1.5 mg Oral BID   sevelamer carbonate  800 mg Oral TID WC   torsemide  100 mg Oral Q T,Th,S,Su    BMET    Component Value Date/Time   NA 135 07/03/2023 0507   NA 144 07/19/2020 1458   NA 140 03/14/2017 0810   K 4.4 07/03/2023 0507   K 3.8 03/14/2017 0810   CL 101 07/03/2023 0507   CO2 21 (L) 07/03/2023 0507   CO2 27 03/14/2017 0810   GLUCOSE 119 (H) 07/03/2023 0507   GLUCOSE 177 (H) 03/14/2017 0810   BUN 68 (H) 07/03/2023 0507   BUN 85 (HH) 07/19/2020 1458   BUN 35.6 (H) 03/14/2017 0810   CREATININE 4.81 (H) 07/03/2023 0507   CREATININE 2.31 (H) 10/25/2022 1355   CREATININE 1.8 (H) 03/14/2017 0810   CALCIUM 8.5 (L) 07/03/2023 0507   CALCIUM 8.3 (L) 06/18/2023 0334   CALCIUM 9.6 03/14/2017 0810    GFRNONAA 9 (L) 07/03/2023 0507   GFRNONAA 22 (L) 10/25/2022 1355   GFRNONAA 45 (L) 12/17/2013 1158   GFRAA 21 (L) 12/21/2019 0845   GFRAA 52 (L) 12/17/2013 1158   CBC    Component Value Date/Time   WBC 12.1 (H) 07/03/2023 0757   RBC 2.33 (L) 07/03/2023 0757   HGB 6.6 (LL) 07/03/2023 0757   HGB 9.4 (L) 04/18/2023 1434   HGB 9.3 (L) 03/14/2017 0810   HCT 21.6 (L) 07/03/2023 0757   HCT 26.4 (L) 07/21/2019 0809   HCT 29.2 (L) 03/14/2017 0810   PLT 226 07/03/2023 0757   PLT 235 04/18/2023 1434   PLT 292 03/14/2017 0810   PLT 351 10/11/2016 1139   MCV 92.7 07/03/2023 0757   MCV 81.3 03/14/2017 0810   MCH 28.3 07/03/2023 0757   MCHC 30.6 07/03/2023 0757   RDW 17.5 (H) 07/03/2023 0757   RDW 14.0 03/14/2017 0810   LYMPHSABS 1.0 07/01/2023 2200   LYMPHSABS 1.4 03/14/2017 0810   MONOABS 0.6 07/01/2023 2200   MONOABS 0.4 03/14/2017 0810   EOSABS 0.1 07/01/2023 2200   EOSABS 0.1 03/14/2017 0810   BASOSABS 0.0 07/01/2023 2200   BASOSABS 0.0 03/14/2017 0810    HPI:  Pt is a 78 y.o.  with  hypertension, renal vein thrombosis on Eliquis, stroke, type II DM, lupus, CKD 4-5 who was recently admitted for CHF exacerbation and progressive CKD presented back with worsening dyspnea on exertion and fluid overload.      Assessment/Plan:   # Progressive CKD 5 versus AKI on CKD,: With frequent hospitalization for fluid overload.  Responding to IV diuretics but not po and with worsening renal function d/w patient and family (Daughter on phone 4/2) at length recommend starting dialysis. S/p LUE AVG + TDC placement 4/4 with HD 1, HD 2 4/5,  - HD 06/24/23, 4/9, 4/11, 4/14 -  on MWF schedule and plan for HD tomorrow. - CLIP complete-  has been accepted at [facility] -  currently on MWF schedule here.  Hopeful discharge soon.   # CKD-MBD/hyperphosphatemia: Started low dose sevelamer in the recent visit, monitor lab.  PTH 220.  Phos is great now   #Anemia of CKD: Concern about GI bleed, underwent  endoscopy t recently with no sign of bleeding.  Back on Eliquis.  Hb has dropped again and is due for blood transfusion.  Given IV  iron and ESA   # Hypertension/volume: [name] improved after diuresis.  Torsemide on non-dialysis days   #A fib: on eliquis   # fever:  - cultures drawn, follow - vanc with HD and ceftriaxone --  - developed recurrent fever and is now on Zosyn  # Vascular access - developed steal syndrome of left hand. - VVS following s/p excision of AVG 07/01/23 due to concern for steal syndrome with improvement of symptoms and ok to resume anticoagulation.   #Disposition - hopeful discharge soon  Benjamin Brands, MD [facility]

## 2023-07-03 NOTE — Progress Notes (Signed)
 Mobility Specialist Progress Note:    07/03/23 0900  Oxygen Therapy  O2 Device Room Air  Mobility  Activity Ambulated with assistance in hallway  Level of Assistance Contact guard assist, steadying assist  Assistive Device Front wheel walker  Distance Ambulated (ft) 120 ft  Activity Response Tolerated well  Mobility Referral Yes  Mobility visit 1 Mobility  Mobility Specialist Start Time (ACUTE ONLY) [id number]  Mobility Specialist Stop Time (ACUTE ONLY) 0840  Mobility Specialist Time Calculation (min) (ACUTE ONLY) 17 min   Pt received in bed and agreeable. C/o L arm edema and pain, otherwise asymptomatic. Returned to room w/o fault. Pt left on EOB at request to sit up and eat food. Declined sitting up in chair. Call bell in hand and all needs met.   D'Vante Nolon Baxter Mobility Specialist Please contact via Special educational needs teacher or Rehab office at [phone]

## 2023-07-03 NOTE — Progress Notes (Signed)
 [facility]  Subjective  -still some swelling in left hand but overall feeling better   Objective (!) 163/68 94 99.6 F (37.6 C) (Oral) 14 100%  Intake/Output Summary (Last 24 hours) at 07/03/2023 0903 Last data filed at 07/03/2023 0300 Gross per 24 hour  Intake 358.72 ml  Output 200 ml  Net 158.72 ml    Left antecubital and axillary incisions without hematoma Left radial pulse palpable  Laboratory Lab Results: Recent Labs    07/02/23 0339 07/03/23 0757  WBC 14.6* 12.1*  HGB 7.9* 6.6*  HCT 24.8* 21.6*  PLT 214 226   BMET Recent Labs    07/01/23 2200 07/03/23 0507  NA 134* 135  K 5.4* 4.4  CL 101 101  CO2 22 21*  GLUCOSE 162* 119*  BUN 85* 68*  CREATININE 5.37* 4.81*  CALCIUM 8.7* 8.5*    COAG Lab Results  Component Value Date   INR 1.5 (H) 07/28/2022   INR 1.3 (H) 02/03/2022   INR 1.3 (H) 11/05/2021   No results found for: "PTT"  Assessment/Planning:  Postop day 2 status post excision of left upper arm AV graft with vein patch angioplasty of the brachial artery for steal.  Palpable radial pulse.  Overall hand feels better.  Discussed continued elevation.  Okay to resume anticoagulation from our standpoint.  Young Hensen 07/03/2023 9:03 AM --

## 2023-07-03 NOTE — Progress Notes (Signed)
 Progress Note   Patient: [name] ZOX:[id number] DOB: [birth year] DOA: 06/15/2023     17 DOS: the patient was seen and examined on 07/03/2023   Brief hospital course: [name] was admitted to the hospital with the working diagnosis of heart failure exacerbation in the setting of progressive renal disease.   78 yo female with the past medical history of diastolic heart failure, CKD 5, history of DVT, renal vein thrombosis, depression, type 2 diabetes mellitus, history of breast cancer, and systemic erythematous lupus who presented with dyspnea.  Recent hospitalization for volume overload due to heart failure 03/08 to 03/15, she was diuresed and discharged home.  Unfortunately at home she had progressive dyspnea on exertion, bilateral lower extremity edema and rapid weight gain (20 lbs since her discharge from the hospital).  On her initial physical examination her blood pressure was 160/70, HR 94 RR 17 and 02 saturation 97%. Lungs with no wheezing or rhonchi, heart with S1 and S2 present and regular with no gallops, rubs or murmurs, respiratory with no wheezing or rhonchi, abdomen with no distention and positive pitting bilateral lower extremity edema   Na 140, K 5.0 CL 109, bicarbonate 20, glucose 147 bun 82 cr 4,0  BNP 560  High sensitive troponin 125 and 117  Wbc 11,4 hgb 7,0 plt 192   Chest radiograph with hypoinflation, with mild cardiomegaly, bilateral hilar vascular congestion with cephalization of the vasculature and small bilateral pleural effusions.   EKG 88 bpm, normal axis, right bundle branch block, sinus rhythm with no significant ST segment or T wave changes.   Patient was placed on IV furosemide for diuresis, and then transitioned to oral loop diuretics.  Her renal function continue to worsened and decision was made to start patient on renal replacement therapy.   04/04 patient had right internal jugular tunneled HD catheter placed and left upper extremity fistula  placed.  04/05 HD with good toleration.  04/06 improved volume status.   04/07 discharge has held due to low diastolic pressure. She developed signs of cellulitis on her left upper extremity fistula site.  04/08 started on antibiotic therapy.  4/9-10 onwards, persistent severe left arm pain 4/12 vascular surgery consulted, plan for AV graft excision 4/14 s/p excision of AV graft  4/15 new leukocytosis, body aches, temp 99.0 fever workup initiated, started empirically on Vanco and Zosyn 04/16 hgb 6,6 ordered one unit PRBC transfusion, HD today.   Assessment and Plan: * Acute on chronic diastolic CHF (congestive heart failure) (HCC) Echocardiogram with preserved LV systolic function with EF 60 to 65%, moderate LVH, RV systolic function preserved, mild mitral valve regurgitation, mid tricuspid valve regurgitation, RVSP 33,9 mmHg, trivial pericardial effusion,    Volume status has improved.  Continue ultrafiltration for volume management.   Limited medical therapy due to risk of hypotension.   Chronic kidney disease (CKD), stage V (HCC) Progressed to ESRD no on renal replacement therapy per HD  Has a left upper extremity fistula has been removed   Check renal function in am.   Anemia of chronic renal disease.  SP EPO and IV iron  Hgb  6.6  Added one unit PRBC today    Metabolic bone disease, continue with sevelamer.   History of renal vein thrombosis in 2019 and deep vein thrombosis 2022  Anticoagulation with apixaban.    Left upper extremity with clinical signs of acute cellulitis (in the setting of new AVF fistula creation. Fistula has been removed 04/13 Patient with recurrent leukocytosis  and resumed antibiotics    Essential hypertension Mild lower diastolic blood pressure, continue to hold on amlodipine.   Continue fluid management per HD   Acute on chronic anemia Acute on chronic anemia of chronic renal disease.  03./30 PRBC transfusion x 1 04/16 PRBC transfusion x1    GERD (gastroesophageal reflux disease) Continue antiacid therapy with pantoprazole.   Type 2 diabetes mellitus (HCC) Patient is on insulin sliding scale for glucose cover and monitoring.   Gout flare Patient was treated with 3 days of prednisone with good toleration   CAD (coronary artery disease) SP PCI in 2000 No chest pain, no acute coronary syndrome   Depression Continue with rivastigmine, mirtazapine and fluoxetine   Breast cancer of lower-inner quadrant of right female breast (HCC) Follow up as outpatient.         Subjective: patient examined in HD, she is feeling well with no chest pain or dyspnea, no significant pain at the surgical site   Physical Exam: Vitals:   07/03/23 1618 07/03/23 1632 07/03/23 1634 07/03/23 1655  BP: (!) 141/60 (!) 147/67  (!) 128/56  Pulse: 87 91  92  Resp: 15 13  18   Temp: 99.4 F (37.4 C)   99 F (37.2 C)  TempSrc:      SpO2:  100%  100%  Weight:   75 kg   Height:       Neurology awake and alert ENT with mild pallor Cardiovascular with S1 and S2 present and regular with no gallops rubs or murmurs Respiratory with no rales or wheezing no rhonchi  Abdomen with no distention  No lower extremity edema  Data Reviewed:    Family Communication: no family at the bedside   Disposition: Status is: Inpatient Remains inpatient appropriate because: HD   Planned Discharge Destination: Home     Author: Albertus Alt, MD 07/03/2023 5:42 PM  For on call review [email or web address].

## 2023-07-03 NOTE — Progress Notes (Signed)
 Spoke to [employer] at [employer]. RN aware that pt's d/c date has not been confirmed as of yet. Will provide update to clinic RN once d/c date has been confirmed. Will assist as needed.   Lauraine Polite Renal Navigator [phone]

## 2023-07-04 DIAGNOSIS — D649 Anemia, unspecified: Secondary | ICD-10-CM | POA: Diagnosis not present

## 2023-07-04 DIAGNOSIS — N185 Chronic kidney disease, stage 5: Secondary | ICD-10-CM | POA: Diagnosis not present

## 2023-07-04 DIAGNOSIS — I10 Essential (primary) hypertension: Secondary | ICD-10-CM | POA: Diagnosis not present

## 2023-07-04 DIAGNOSIS — I5033 Acute on chronic diastolic (congestive) heart failure: Secondary | ICD-10-CM | POA: Diagnosis not present

## 2023-07-04 LAB — CBC
HCT: 24.6 % — ABNORMAL LOW (ref 36.0–46.0)
Hemoglobin: 7.9 g/dL — ABNORMAL LOW (ref 12.0–15.0)
MCH: 29 pg (ref 26.0–34.0)
MCHC: 32.1 g/dL (ref 30.0–36.0)
MCV: 90.4 fL (ref 80.0–100.0)
Platelets: 231 10*3/uL (ref 150–400)
RBC: 2.72 MIL/uL — ABNORMAL LOW (ref 3.87–5.11)
RDW: 17.4 % — ABNORMAL HIGH (ref 11.5–15.5)
WBC: 11.9 10*3/uL — ABNORMAL HIGH (ref 4.0–10.5)
nRBC: 0.3 % — ABNORMAL HIGH (ref 0.0–0.2)

## 2023-07-04 LAB — TYPE AND SCREEN
ABO/RH(D): B NEG
Antibody Screen: NEGATIVE
Unit division: 0

## 2023-07-04 LAB — GLUCOSE, CAPILLARY
Glucose-Capillary: 100 mg/dL — ABNORMAL HIGH (ref 70–99)
Glucose-Capillary: 143 mg/dL — ABNORMAL HIGH (ref 70–99)
Glucose-Capillary: 191 mg/dL — ABNORMAL HIGH (ref 70–99)
Glucose-Capillary: 191 mg/dL — ABNORMAL HIGH (ref 70–99)

## 2023-07-04 LAB — BPAM RBC
Blood Product Expiration Date: 202505102359
ISSUE DATE / TIME: 202504161329
Unit Type and Rh: 1700

## 2023-07-04 LAB — HEPARIN LEVEL (UNFRACTIONATED): Heparin Unfractionated: 0.58 [IU]/mL (ref 0.30–0.70)

## 2023-07-04 LAB — APTT: aPTT: 82 s — ABNORMAL HIGH (ref 24–36)

## 2023-07-04 NOTE — Progress Notes (Signed)
 PHARMACY - ANTICOAGULATION CONSULT NOTE  Pharmacy Consult for heparin Indication: hx DVT and renal vein thrombus   Allergies  Allergen Reactions   Anesthetics, Halogenated Other (See Comments)    Per patient's daughter, she can not tolerate anesthetics.    Etomidate Other (See Comments)    Heart stopped during anesthesia   Fentanyl Other (See Comments)    Heart stopped during anesthesia    Percocet [Oxycodone-Acetaminophen] Other (See Comments)    Headaches    Diazepam Other (See Comments)    Agitation   Lorazepam Nausea Only   Tramadol Hcl Nausea And Vomiting   Haloperidol Nausea And Vomiting    **Haldol**   Morphine Sulfate Nausea Only    agitation   Propoxyphene Nausea And Vomiting    **Darvocet** no longer available   Versed [Midazolam] Other (See Comments)    Heart stopped during anesthesia    Patient Measurements: Height: (P) 5\' 4"  (162.6 cm) Weight: 75 kg (165 lb 5.5 oz) IBW/kg (Calculated) : (P) 54.7 HEPARIN DW (KG): (P) 71  Vital Signs: Temp: 98.1 F (36.7 C) (04/17 0556) BP: 144/55 (04/17 0556) Pulse Rate: 88 (04/17 0556)  Labs: Recent Labs    07/01/23 0824 07/01/23 2200 07/02/23 0339 07/02/23 1547 07/03/23 0507 07/03/23 0757 07/03/23 1944 07/04/23 0448  HGB 7.2* 6.9* 7.9*  --   --  6.6* 8.2* 7.9*  HCT 23.0* 22.7* 24.8*  --   --  21.6* 25.5* 24.6*  PLT 223 231 214  --   --  226  --  231  APTT 62*  --   --  78* 96*  --   --  82*  HEPARINUNFRC >1.10*  --   --  0.68 0.71*  --   --  0.58  CREATININE 4.99* 5.37*  --   --  4.81*  --   --   --     Estimated Creatinine Clearance: 9.9 mL/min (A) (by C-G formula based on SCr of 4.81 mg/dL (H)).   Medical History: Past Medical History:  Diagnosis Date   Abdominal discomfort    Chronic N/V/D. Presumptive dx Crohn's dx per elevated p ANCA. Failed Entocort and Pentasa. Sep 2003 - ileocolectomy c anastomosis per Dr Duwaine Gins 2/2 adhesions - path was hegative for Crohns. EGD, Sm bowel follow through  (11/03), and an eteroclysis (10/03) were unrevealing. Cuases hypomag and hypocalcemia.   Adnexal mass 10/2001   s/p lap BSO (R ovarian fibroma) & lysis of adhesions   Allergy    Seasonal   Anemia    Multifactorial. Baseline HgB 10-11 ish. B12 def - 150 in 3/10. Fe Def - ferritin 35 3/10. Both are being repleted.   Breast cancer (HCC) 03/16/2013   right, 5 o'clock   CAD (coronary artery disease) 1996   1996 - PTCA and angioplasty diagonal branch. 2000 - Rotoblator & angiopllasty of diagonal. 2006 - subendocardial AMI, DES to proximal LAD.Aaron Aas Also had 90% stenosis in distal apical LAD. EF 55 with apical hypokinesis. Indefinite ASA and Plavix.   CHF (congestive heart failure) (HCC)    Chronic kidney disease, stage 4 (severe) (HCC)    Chronic pain    CT 10/10 = Spinal stenosis L2 - S1.   COVID-19 11/02/2020   Diabetes mellitus    Insulin dependent   Diabetes mellitus type 2 in obese 05/03/2010   Managed on lantus and novolog. Has diabetic nephropathy. Metformin D/C'd 2012 2/2 creatinine.  No diabetic retinopathy per 3/11.    Gout    Hx of radiation  therapy 06/02/13- 07/16/13   right rbeast 4500 cGy 25 sessions, right breast boost 1600 cGy in 8 sessions   Hyperlipidemia    Managed with both a statin and Welchol. Welchol stopped 2014 2/2 cost and started on fenofibrate    Hypertension    2006 B renal arteries patent. 2003 MRA - no RAS. 2003 pheo W/U Dr Francetta Innocent reportedly negative.   Hypoxia 07/23/2017   Lupus    Lymphedema of breast    Personal history of radiation therapy 2015   Pneumonia of left lung due to infectious organism    RBBB    Renal vein thrombosis (HCC)    SBO (small bowel obstruction) (HCC) 09/17/2017   Secondary hyperparathyroidism (HCC)    Stroke [facility])    Incidental finding MRI 2002 L lacunar infarct   Vitamin B12 deficiency    Vitamin D deficiency    Wears dentures    top      Assessment: 78 yo female on apixaban PTA for  hx DVT and renal vein thrombus admitted for  AV graft steal syndrome and now s/p AV graft excision. Pharmacy consulted for heparin to start 4/15 per Dr. Fulton Job  Heparin level is close to correlating with aPTT. APTT is 82 and therapeutic with heparin level 0.58 on 1650 units/hr.  Goal of Therapy:  Heparin level 0.3-0.7 units/ml aPTT 66-102 seconds Monitor platelets by anticoagulation protocol: Yes   Plan:  Continue Heparin at 1650 units/hr  F/u aPTT until correlates with heparin level  Monitor daily aPTT, heparin level, CBC, signs/symptoms of bleeding  F/u restart apixaban   [name] A. Brynn Caras, PharmD, BCPS, FNKF Clinical Pharmacist [place] Please utilize Amion for appropriate phone number to reach the unit pharmacist [facility])  07/04/2023, 7:42 AM

## 2023-07-04 NOTE — Progress Notes (Signed)
 Mobility Specialist Progress Note:    07/04/23 1000  Mobility  Activity Stood at bedside  Level of Assistance Moderate assist, patient does 50-74%  Assistive Device Front wheel walker  Activity Response Tolerated fair;Tolerated poorly  Mobility Referral Yes  Mobility visit 1 Mobility  Mobility Specialist Start Time (ACUTE ONLY) [id number]  Mobility Specialist Stop Time (ACUTE ONLY) 0935  Mobility Specialist Time Calculation (min) (ACUTE ONLY) 13 min   Pt received in chair and agreeable. NT and family reported pt having difficulty mobilizing last night and earlier this am. Required modA to stand from chair x2. Unable to clear feet off floor to march in place or attempt gait trail. C/o BLE pain from knees down and L arm. Pt left in chair with call bell and all needs met. Family present.  D'Vante Nolon Baxter Mobility Specialist Please contact via Special educational needs teacher or Rehab office at [phone]

## 2023-07-04 NOTE — Progress Notes (Signed)
 Inpatient Rehab Admissions Coordinator Note:   Per PT recommendations patient was screened for CIR candidacy by Mickey Alar, PT. At this time, pt appears to be a potential candidate for CIR. I will place an order for rehab consult for full assessment, per our protocol.  Note OT eval placed for today.  Please contact me any with questions.   Loye Rumble, PT, DPT [phone] 07/04/23 3:30 PM

## 2023-07-04 NOTE — Evaluation (Signed)
 Physical Therapy Evaluation Patient Details Name: [name] MRN: [id number] DOB: [birth year] Today's Date: 07/04/2023  History of Present Illness  Patient is a 78 yo female presenting to [facility] on 06/15/23 with worsening DOE and fluid overload.  Now on ESRD on HD.  Developed cellulitis vs. Steal syndrome L AV graft and had graft excision on 07/01/23.  PMH:  CKD IV, HTN, CHF, CAD, DVT, CVA, DM2, gout, chronic gastritis, breast CA, dementia, depression.  Clinical Impression  Patient presents with decreased mobility due to generalized weakness, pain, limited activity tolerance, decreased balance and decreased safety awareness.  Previous to admissions she was independent at home with family support for IADL's.  Currently mod A overall for transfers, ambulation to bathroom and difficulty with toileting tasks.  She will benefit from skilled PT in the acute setting and recommend post-acute inpatient rehab (>3 hours/day) prior to d/c home.  She has excellent family support and though has two level home could stay on main level if needed.       If plan is discharge home, recommend the following: A lot of help with bathing/dressing/bathroom;A lot of help with walking and/or transfers;Assist for transportation;Help with stairs or ramp for entrance;Assistance with cooking/housework;Direct supervision/assist for financial management;Supervision due to cognitive status   Can travel by private vehicle        Equipment Recommendations None recommended by PT  Recommendations for Other Services  Rehab consult    Functional Status Assessment Patient has had a recent decline in their functional status and demonstrates the ability to make significant improvements in function in a reasonable and predictable amount of time.     Precautions / Restrictions Precautions Precautions: Fall Recall of Precautions/Restrictions: Impaired Precaution/Restrictions Comments: L UE edema at site with AVG excision       Mobility  Bed Mobility               General bed mobility comments: in recliner    Transfers     Transfers: Sit to/from Stand Sit to Stand: Mod assist           General transfer comment: lifting assist with cues for hand placement and increased time from recliner, then to and from 3:1 over toilet in bathroom    Ambulation/Gait Ambulation/Gait assistance: Min assist, Mod assist Gait Distance (Feet): 12 Feet (&8') Assistive device: Left platform walker Gait Pattern/deviations: Step-to pattern, Decreased stride length, Trunk flexed, Shuffle, Wide base of support       General Gait Details: more difficulty progressing R LE than L and increased time with cues for walking forward to end of the bed, then noted she needed to toilet so turned with A for walker, balance and stepping to get to bathroom, after toileted walked to recliner  Stairs            Wheelchair Mobility     Tilt Bed    Modified Rankin (Stroke Patients Only)       Balance Overall balance assessment: Needs assistance   Sitting balance-Leahy Scale: Fair Sitting balance - Comments: in recliner with feet on the floor no LOB   Standing balance support: Bilateral upper extremity supported Standing balance-Leahy Scale: Poor Standing balance comment: UE support needed for balance and with turning walker LOB posterior mod A to recover                             Pertinent Vitals/Pain Pain Assessment Pain Assessment: Faces Faces Pain Scale:  Hurts even more Pain Location: both arms and legs with movement Pain Descriptors / Indicators: Grimacing, Sore Pain Intervention(s): Monitored during session    Home Living Family/patient expects to be discharged to:: Private residence Living Arrangements: Other relatives Available Help at Discharge: Family;Available 24 hours/day Type of Home: House Home Access: Level entry     Alternate Level Stairs-Number of Steps: 12 Home Layout:  Two level;1/2 bath on main level;Able to live on main level with bedroom/bathroom Home Equipment: Rolling Walker (2 wheels);Rollator (4 wheels);Cane - single point      Prior Function Prior Level of Function : Needs assist               ADLs Comments: had assist for medications, driving, was to get grabbar and shower chair after last stay but never delivered per grandaughter in the room     Extremity/Trunk Assessment   Upper Extremity Assessment Upper Extremity Assessment: LUE deficits/detail;RUE deficits/detail RUE Deficits / Details: AAROM WFL, limited AROM shoulder flexion to about 80 degrees and painful with limited external rotation; strength shoulder flexion 2+/5, elbow extension 2/5, elbow flexion 4-/5 RUE Sensation: WNL LUE Deficits / Details: edema in hand and arm with limited grip esp ring and pinky finger with testing (Though able to take meds in a cup with L hand); elbow extension tight but WFL, flexion AROM WFL though slow and needed encouragement, shoulder AAROM limited by pain about 100 degrees, strength grossly 3/5 elbow, 2/5 shoulder LUE: Shoulder pain with ROM    Lower Extremity Assessment Lower Extremity Assessment: RLE deficits/detail;LLE deficits/detail RLE Deficits / Details: AAROM limited in sitting with legs elevated c/o pain in legs with resisted ankle DF and strength hip 2/5, knee extension 4-/5 LLE Deficits / Details: AAROM limited in sitting with legs elevated c/o pain in legs with resisted ankle DF and strength hip 2/5, knee extension 4-/5    Cervical / Trunk Assessment Cervical / Trunk Assessment: Kyphotic  Communication   Communication Communication: No apparent difficulties    Cognition Arousal: Alert Behavior During Therapy: WFL for tasks assessed/performed   PT - Cognitive impairments: History of cognitive impairments, Memory, Attention                       PT - Cognition Comments: very limited STM, did not recall we had cleaned  up after BM prior to sitting back on toilet to don clean underwear Following commands: Intact       Cueing Cueing Techniques: Verbal cues     General Comments General comments (skin integrity, edema, etc.): grandaughter present, spoke with daughter on the phone for prior function and home set up; patient incontinent with stool during ambulation, assist for hygiene    Exercises     Assessment/Plan    PT Assessment Patient needs continued PT services  PT Problem List Decreased strength;Decreased activity tolerance;Decreased mobility;Decreased cognition;Decreased balance;Decreased knowledge of use of DME       PT Treatment Interventions DME instruction;Gait training;Functional mobility training;Therapeutic activities;Patient/family education;Stair training;Neuromuscular re-education;Balance training;Therapeutic exercise    PT Goals (Current goals can be found in the Care Plan section)  Acute Rehab PT Goals Patient Stated Goal: to go to rehab prior to home PT Goal Formulation: With patient/family Time For Goal Achievement: 07/18/23 Potential to Achieve Goals: Good    Frequency Min 2X/week     Co-evaluation               AM-PAC PT "6 Clicks" Mobility  Outcome Measure Help needed  turning from your back to your side while in a flat bed without using bedrails?: A Lot Help needed moving from lying on your back to sitting on the side of a flat bed without using bedrails?: A Lot Help needed moving to and from a bed to a chair (including a wheelchair)?: A Lot Help needed standing up from a chair using your arms (e.g., wheelchair or bedside chair)?: A Lot Help needed to walk in hospital room?: Total Help needed climbing 3-5 steps with a railing? : Total 6 Click Score: 10    End of Session Equipment Utilized During Treatment: Gait belt Activity Tolerance: Patient limited by fatigue Patient left: in chair;with call bell/phone within reach;with family/visitor present   PT  Visit Diagnosis: Muscle weakness (generalized) (M62.81);Difficulty in walking, not elsewhere classified (R26.2);Other abnormalities of gait and mobility (R26.89)    Time: 4098-1191 PT Time Calculation (min) (ACUTE ONLY): 34 min   Charges:   PT Evaluation $PT Eval Moderate Complexity: 1 Mod PT Treatments $Gait Training: 8-22 mins PT General Charges $$ ACUTE PT VISIT: 1 Visit         Abigail Hoff, PT Acute Rehabilitation Services Office:[phone] 07/04/2023   Marley Simmers 07/04/2023, 12:45 PM

## 2023-07-04 NOTE — Progress Notes (Signed)
 Patient ID: [name], female   DOB: 12-May-1945, 78 y.o.   MRN: [id number] S: Family at bedside and reports that she has required more assistance with moving this am and was seen by mobility specialist.  May need SNF/rehab O:BP (!) 150/56 (BP Location: Right Arm)   Pulse 88   Temp 99.9 F (37.7 C) (Oral)   Resp 18   Ht (P) 5\' 4"  (1.626 m)   Wt 75 kg   SpO2 99%   BMI (P) 28.38 kg/m   Intake/Output Summary (Last 24 hours) at 07/04/2023 1145 Last data filed at 07/04/2023 1131 Gross per 24 hour  Intake 1626.25 ml  Output 5700 ml  Net -4073.75 ml   Intake/Output: I/O last 3 completed shifts: In: 1535.1 [P.O.:60; I.V.:763; Blood:363.3; IV Piggyback:348.8] Out: 5250 [Urine:250; Other:5000]  Intake/Output this shift:  Total I/O In: 449.9 [P.O.:360; I.V.:77.9; IV Piggyback:12] Out: 700 [Urine:700] Weight change:  Gen: NAD CVS: RRR Resp:CTA Abd: +BS, soft, NT/ND Ext: trace-1+ RUE edema  Recent Labs  Lab 06/28/23 0253 06/29/23 0412 06/30/23 0920 07/01/23 0824 07/01/23 2200 07/03/23 0507  NA 137 135 134* 136 134* 135  K 4.3 4.1 5.1 5.3* 5.4* 4.4  CL 100 98 99 99 101 101  CO2 23 26 24 24 22  21*  GLUCOSE 139* 107* 140* 87 162* 119*  BUN 65* 40* 64* 83* 85* 68*  CREATININE 5.09* 3.30* 4.28* 4.99* 5.37* 4.81*  ALBUMIN  --   --   --   --  2.1* 2.0*  CALCIUM 8.6* 8.8* 8.7* 8.9 8.7* 8.5*  PHOS  --   --   --   --  5.1* 5.1*   Liver Function Tests: Recent Labs  Lab 07/01/23 2200 07/03/23 0507  ALBUMIN 2.1* 2.0*   No results for input(s): "LIPASE", "AMYLASE" in the last 168 hours. No results for input(s): "AMMONIA" in the last 168 hours. CBC: Recent Labs  Lab 07/01/23 0824 07/01/23 2200 07/02/23 0339 07/03/23 0757 07/03/23 1944 07/04/23 0448  WBC 11.2* 10.2 14.6* 12.1*  --  11.9*  NEUTROABS  --  8.3*  --   --   --   --   HGB 7.2* 6.9* 7.9* 6.6* 8.2* 7.9*  HCT 23.0* 22.7* 24.8* 21.6* 25.5* 24.6*  MCV 92.4 95.4 92.2 92.7  --  90.4  PLT 223 231 214 226  --   231   Cardiac Enzymes: No results for input(s): "CKTOTAL", "CKMB", "CKMBINDEX", "TROPONINI" in the last 168 hours. CBG: Recent Labs  Lab 07/03/23 0748 07/03/23 1136 07/03/23 1657 07/03/23 2032 07/04/23 0746  GLUCAP 115* 166* 165* 173* 100*    Iron Studies: No results for input(s): "IRON", "TIBC", "TRANSFERRIN", "FERRITIN" in the last 72 hours. Studies/Results: DG CHEST PORT 1 VIEW Result Date: 07/02/2023 CLINICAL DATA:  Cough, leukocytosis EXAM: PORTABLE CHEST 1 VIEW COMPARISON:  06/22/2023 FINDINGS: Right dialysis catheter tip in the SVC. Heart and mediastinal contours are within normal limits. No focal opacities or effusions. No acute bony abnormality. Lucency in the left axilla soft tissues, question gas. IMPRESSION: Question gas in the left axillary soft tissues. No acute cardiopulmonary disease. Electronically Signed   By: Charlett Nose M.D.   On: 07/02/2023 17:46    sodium chloride   Intravenous Once   Chlorhexidine Gluconate Cloth  6 each Topical Q0600   colestipol  1 g Oral TID   darbepoetin (ARANESP) injection - DIALYSIS  150 mcg Subcutaneous Q Sun-1800   FLUoxetine  20 mg Oral Daily   folic acid  1  mg Oral Daily   gabapentin  300 mg Oral QHS   insulin aspart  0-6 Units Subcutaneous TID WC   lipase/protease/amylase  36,000 Units Oral TID AC   mirtazapine  7.5 mg Oral QHS   multivitamin  1 tablet Oral QHS   pantoprazole  40 mg Oral BID   polyethylene glycol  17 g Oral Daily   rivastigmine  1.5 mg Oral BID   sevelamer carbonate  800 mg Oral TID WC   torsemide  100 mg Oral Q T,Th,S,Su    BMET    Component Value Date/Time   NA 135 07/03/2023 0507   NA 144 07/19/2020 1458   NA 140 03/14/2017 0810   K 4.4 07/03/2023 0507   K 3.8 03/14/2017 0810   CL 101 07/03/2023 0507   CO2 21 (L) 07/03/2023 0507   CO2 27 03/14/2017 0810   GLUCOSE 119 (H) 07/03/2023 0507   GLUCOSE 177 (H) 03/14/2017 0810   BUN 68 (H) 07/03/2023 0507   BUN 85 (HH) 07/19/2020 1458   BUN 35.6 (H)  03/14/2017 0810   CREATININE 4.81 (H) 07/03/2023 0507   CREATININE 2.31 (H) 10/25/2022 1355   CREATININE 1.8 (H) 03/14/2017 0810   CALCIUM 8.5 (L) 07/03/2023 0507   CALCIUM 8.3 (L) 06/18/2023 0334   CALCIUM 9.6 03/14/2017 0810   GFRNONAA 9 (L) 07/03/2023 0507   GFRNONAA 22 (L) 10/25/2022 1355   GFRNONAA 45 (L) 12/17/2013 1158   GFRAA 21 (L) 12/21/2019 0845   GFRAA 52 (L) 12/17/2013 1158   CBC    Component Value Date/Time   WBC 11.9 (H) 07/04/2023 0448   RBC 2.72 (L) 07/04/2023 0448   HGB 7.9 (L) 07/04/2023 0448   HGB 9.4 (L) 04/18/2023 1434   HGB 9.3 (L) 03/14/2017 0810   HCT 24.6 (L) 07/04/2023 0448   HCT 26.4 (L) 07/21/2019 0809   HCT 29.2 (L) 03/14/2017 0810   PLT 231 07/04/2023 0448   PLT 235 04/18/2023 1434   PLT 292 03/14/2017 0810   PLT 351 10/11/2016 1139   MCV 90.4 07/04/2023 0448   MCV 81.3 03/14/2017 0810   MCH 29.0 07/04/2023 0448   MCHC 32.1 07/04/2023 0448   RDW 17.4 (H) 07/04/2023 0448   RDW 14.0 03/14/2017 0810   LYMPHSABS 1.0 07/01/2023 2200   LYMPHSABS 1.4 03/14/2017 0810   MONOABS 0.6 07/01/2023 2200   MONOABS 0.4 03/14/2017 0810   EOSABS 0.1 07/01/2023 2200   EOSABS 0.1 03/14/2017 0810   BASOSABS 0.0 07/01/2023 2200   BASOSABS 0.0 03/14/2017 0810    HPI:  Pt is a 78 y.o.  with  hypertension, renal vein thrombosis on Eliquis, stroke, type II DM, lupus, CKD 4-5 who was recently admitted for CHF exacerbation and progressive CKD presented back with worsening dyspnea on exertion and fluid overload.      Assessment/Plan:   # Progressive CKD 5 versus AKI on CKD,: With frequent hospitalization for fluid overload.  Responding to IV diuretics but not po and with worsening renal function d/w patient and family (Daughter on phone 4/2) at length recommend starting dialysis. S/p LUE AVG + TDC placement 4/4 with HD 1, HD 2 4/5,  - HD 06/24/23, 4/9, 4/11, 4/14 -  on MWF schedule and plan for HD tomorrow. - CLIP complete-  has been [facility] -   currently on MWF schedule here.  Hopeful discharge soon.   # CKD-MBD/hyperphosphatemia: Started low dose sevelamer in the recent visit, monitor lab.  PTH 220.  Phos is great now   #Anemia of CKD: Concern about GI bleed, underwent endoscopy t recently with no sign of bleeding.  Back on Eliquis.  Hb has dropped again and is due for blood transfusion.  Given IV iron and ESA   # Hypertension/volume: [name] improved after diuresis.  Torsemide on non-dialysis days   #A fib: on eliquis   # fever:  - cultures drawn, follow - vanc with HD and ceftriaxone --  - developed recurrent fever and is now on Zosyn   # Vascular access - developed steal syndrome of left hand. - VVS following s/p excision of AVG 07/01/23 due to concern for steal syndrome with improvement of symptoms and ok to resume anticoagulation.   #Disposition - may require SNF placement/CIR due to deconditioning.  Benjamin [employer], MD [facility]

## 2023-07-04 NOTE — Progress Notes (Signed)
 Progress Note   Patient: [name] EAV:[id number] DOB: October 17, 1945 DOA: 06/15/2023     18 DOS: the patient was seen and examined on 07/04/2023   Brief hospital course: [name] was admitted to the hospital with the working diagnosis of heart failure exacerbation in the setting of progressive renal disease.   78 yo female with the past medical history of diastolic heart failure, CKD 5, history of DVT, renal vein thrombosis, depression, type 2 diabetes mellitus, history of breast cancer, and systemic erythematous lupus who presented with dyspnea.  Recent hospitalization for volume overload due to heart failure 03/08 to 03/15, she was diuresed and discharged home.  Unfortunately at home she had progressive dyspnea on exertion, bilateral lower extremity edema and rapid weight gain (20 lbs since her discharge from the hospital).  On her initial physical examination her blood pressure was 160/70, HR 94 RR 17 and 02 saturation 97%. Lungs with no wheezing or rhonchi, heart with S1 and S2 present and regular with no gallops, rubs or murmurs, respiratory with no wheezing or rhonchi, abdomen with no distention and positive pitting bilateral lower extremity edema   Na 140, K 5.0 CL 109, bicarbonate 20, glucose 147 bun 82 cr 4,0  BNP 560  High sensitive troponin 125 and 117  Wbc 11,4 hgb 7,0 plt 192   Chest radiograph with hypoinflation, with mild cardiomegaly, bilateral hilar vascular congestion with cephalization of the vasculature and small bilateral pleural effusions.   EKG 88 bpm, normal axis, right bundle branch block, sinus rhythm with no significant ST segment or T wave changes.   Patient was placed on IV furosemide for diuresis, and then transitioned to oral loop diuretics.  Her renal function continue to worsened and decision was made to start patient on renal replacement therapy.   04/04 patient had right internal jugular tunneled HD catheter placed and left upper extremity fistula  placed.  04/05 HD with good toleration.  04/06 improved volume status.   04/07 discharge has held due to low diastolic pressure. She developed signs of cellulitis on her left upper extremity fistula site.  04/08 started on antibiotic therapy.  4/9-10 onwards, persistent severe left arm pain 4/12 vascular surgery consulted, plan for AV graft excision 4/14 s/p excision of AV graft  4/15 new leukocytosis, body aches, temp 99.0 fever workup initiated, started empirically on Vanco and Zosyn 04/16 hgb 6,6 ordered one unit PRBC transfusion, HD today.   Assessment and Plan: * Acute on chronic diastolic CHF (congestive heart failure) (HCC) Echocardiogram with preserved LV systolic function with EF 60 to 65%, moderate LVH, RV systolic function preserved, mild mitral valve regurgitation, mid tricuspid valve regurgitation, RVSP 33,9 mmHg, trivial pericardial effusion,    Volume status has improved.  Continue ultrafiltration for volume management.   Limited medical therapy due to risk of hypotension.   Chronic kidney disease (CKD), stage V (HCC) Progressed to ESRD no on renal replacement therapy per HD  Has a left upper extremity fistula has been removed   Anemia of chronic renal disease.  SP EPO and IV iron  Hgb 7.9   Metabolic bone disease, continue with sevelamer.   History of renal vein thrombosis in 2019 and deep vein thrombosis 2022  Anticoagulation with apixaban.   Left upper extremity with clinical signs of acute cellulitis (in the setting of new AVF fistula creation). Fistula has been removed 04/13 Wbc is 11,9, patient has been afebrile, cultures are no growth.  OP note from fistula excision with no signs  of local infection.  Will hold antibiotic therapy and continue close follow up.    Essential hypertension Continue fluid management per HD   Acute on chronic anemia Acute on chronic anemia of chronic renal disease.  03./30 PRBC transfusion x 1 04/16 PRBC transfusion x1    GERD (gastroesophageal reflux disease) Continue antiacid therapy with pantoprazole.   Type 2 diabetes mellitus (HCC) Patient is on insulin sliding scale for glucose cover and monitoring.   Gout flare Patient was treated with 3 days of prednisone with good toleration   CAD (coronary artery disease) SP PCI in 2000 No chest pain, no acute coronary syndrome   Depression Continue with rivastigmine, mirtazapine and fluoxetine   Breast cancer of lower-inner quadrant of right female breast (HCC) Follow up as outpatient.       Subjective: Patient is feeling better, she had HD yesterday with good toleration. She continue to have moderate pain on her left upper extremity worse with movement.   Physical Exam: Vitals:   07/03/23 1655 07/03/23 2034 07/04/23 0556 07/04/23 0745  BP: (!) 128/56 (!) 123/50 (!) 144/55 (!) 150/56  Pulse: 92 86 88 88  Resp: 18 18 18 18   Temp: 99 F (37.2 C) 98.7 F (37.1 C) 98.1 F (36.7 C) 99.9 F (37.7 C)  TempSrc:    Oral  SpO2: 100% 96% 100% 99%  Weight:      Height:       Neurology awake and alert ENT with mild pallor with no icterus Cardiovascular with S1 and S2 present and regular with no gallops, rubs or murmurs Respiratory with no rales or wheezing, no rhonchi  Abdomen with no distention  No lower extremity edema Left upper extremity with mild local edema at the elbow, tender to palpation the distal arm with no loculation, no erythema or increased local temperature.     Left upper extremity at the level of the prior AV fistula   Data Reviewed:    Family Communication: no family at the bedside   Disposition: Status is: Inpatient Remains inpatient appropriate because: possible transfer to CIR   Planned Discharge Destination: Rehab     Author: Albertus Alt, MD 07/04/2023 4:00 PM  For on call review [email or web address].

## 2023-07-04 NOTE — Progress Notes (Signed)
 Progress Note    07/04/2023 8:27 AM 3 Days Post-Op  Subjective:  resting     Vitals:   07/04/23 0556 07/04/23 0745  BP: (!) 144/55 (!) 150/56  Pulse: 88 88  Resp: 18 18  Temp: 98.1 F (36.7 C) 99.9 F (37.7 C)  SpO2: 100% 99%    Physical Exam: General:  resting, NAD Cardiac:  regular Lungs:  nonlabored Incisions: Left upper arm incisions healing appropriately and dry Extremities: Continued edema in left hand.  2+ left radial pulse   CBC    Component Value Date/Time   WBC 11.9 (H) 07/04/2023 0448   RBC 2.72 (L) 07/04/2023 0448   HGB 7.9 (L) 07/04/2023 0448   HGB 9.4 (L) 04/18/2023 1434   HGB 9.3 (L) 03/14/2017 0810   HCT 24.6 (L) 07/04/2023 0448   HCT 26.4 (L) 07/21/2019 0809   HCT 29.2 (L) 03/14/2017 0810   PLT 231 07/04/2023 0448   PLT 235 04/18/2023 1434   PLT 292 03/14/2017 0810   PLT 351 10/11/2016 1139   MCV 90.4 07/04/2023 0448   MCV 81.3 03/14/2017 0810   MCH 29.0 07/04/2023 0448   MCHC 32.1 07/04/2023 0448   RDW 17.4 (H) 07/04/2023 0448   RDW 14.0 03/14/2017 0810   LYMPHSABS 1.0 07/01/2023 2200   LYMPHSABS 1.4 03/14/2017 0810   MONOABS 0.6 07/01/2023 2200   MONOABS 0.4 03/14/2017 0810   EOSABS 0.1 07/01/2023 2200   EOSABS 0.1 03/14/2017 0810   BASOSABS 0.0 07/01/2023 2200   BASOSABS 0.0 03/14/2017 0810    BMET    Component Value Date/Time   NA 135 07/03/2023 0507   NA 144 07/19/2020 1458   NA 140 03/14/2017 0810   K 4.4 07/03/2023 0507   K 3.8 03/14/2017 0810   CL 101 07/03/2023 0507   CO2 21 (L) 07/03/2023 0507   CO2 27 03/14/2017 0810   GLUCOSE 119 (H) 07/03/2023 0507   GLUCOSE 177 (H) 03/14/2017 0810   BUN 68 (H) 07/03/2023 0507   BUN 85 (HH) 07/19/2020 1458   BUN 35.6 (H) 03/14/2017 0810   CREATININE 4.81 (H) 07/03/2023 0507   CREATININE 2.31 (H) 10/25/2022 1355   CREATININE 1.8 (H) 03/14/2017 0810   CALCIUM 8.5 (L) 07/03/2023 0507   CALCIUM 8.3 (L) 06/18/2023 0334   CALCIUM 9.6 03/14/2017 0810   GFRNONAA 9 (L)  07/03/2023 0507   GFRNONAA 22 (L) 10/25/2022 1355   GFRNONAA 45 (L) 12/17/2013 1158   GFRAA 21 (L) 12/21/2019 0845   GFRAA 52 (L) 12/17/2013 1158    INR    Component Value Date/Time   INR 1.5 (H) 07/28/2022 1615     Intake/Output Summary (Last 24 hours) at 07/04/2023 0827 Last data filed at 07/04/2023 0816 Gross per 24 hour  Intake 1215.02 ml  Output 5950 ml  Net -4734.98 ml      Assessment/Plan:  78 y.o. female is 3 days postop, s/p: Excision of left upper arm AV graft with brachial artery vein patch   - She endorses some soreness in the left arm, but overall she feels better -She denies any pain in the left hand.  She has a palpable left radial pulse -Continued edema in the left hand.  I have encouraged the patient to continue elevating the arm to decrease swelling -Stable for discharge from vascular perspective.  Will arrange follow-up with our office in 2 to 3 weeks for incision check   Loel Dubonnet, PA-C [facility] [phone] 07/04/2023 8:27 AM

## 2023-07-05 ENCOUNTER — Other Ambulatory Visit (HOSPITAL_COMMUNITY): Payer: Self-pay

## 2023-07-05 DIAGNOSIS — D649 Anemia, unspecified: Secondary | ICD-10-CM | POA: Diagnosis not present

## 2023-07-05 DIAGNOSIS — I10 Essential (primary) hypertension: Secondary | ICD-10-CM | POA: Diagnosis not present

## 2023-07-05 DIAGNOSIS — I5033 Acute on chronic diastolic (congestive) heart failure: Secondary | ICD-10-CM | POA: Diagnosis not present

## 2023-07-05 DIAGNOSIS — N185 Chronic kidney disease, stage 5: Secondary | ICD-10-CM | POA: Diagnosis not present

## 2023-07-05 LAB — CBC
HCT: 24.7 % — ABNORMAL LOW (ref 36.0–46.0)
Hemoglobin: 7.7 g/dL — ABNORMAL LOW (ref 12.0–15.0)
MCH: 28.6 pg (ref 26.0–34.0)
MCHC: 31.2 g/dL (ref 30.0–36.0)
MCV: 91.8 fL (ref 80.0–100.0)
Platelets: 252 10*3/uL (ref 150–400)
RBC: 2.69 MIL/uL — ABNORMAL LOW (ref 3.87–5.11)
RDW: 17.4 % — ABNORMAL HIGH (ref 11.5–15.5)
WBC: 11.7 10*3/uL — ABNORMAL HIGH (ref 4.0–10.5)
nRBC: 0.4 % — ABNORMAL HIGH (ref 0.0–0.2)

## 2023-07-05 LAB — RENAL FUNCTION PANEL
Albumin: 2 g/dL — ABNORMAL LOW (ref 3.5–5.0)
Anion gap: 17 — ABNORMAL HIGH (ref 5–15)
BUN: 58 mg/dL — ABNORMAL HIGH (ref 8–23)
CO2: 22 mmol/L (ref 22–32)
Calcium: 9.1 mg/dL (ref 8.9–10.3)
Chloride: 96 mmol/L — ABNORMAL LOW (ref 98–111)
Creatinine, Ser: 5.03 mg/dL — ABNORMAL HIGH (ref 0.44–1.00)
GFR, Estimated: 8 mL/min — ABNORMAL LOW (ref >60)
Glucose, Bld: 98 mg/dL (ref 70–99)
Phosphorus: 4.8 mg/dL — ABNORMAL HIGH (ref 2.5–4.6)
Potassium: 3.8 mmol/L (ref 3.5–5.1)
Sodium: 135 mmol/L (ref 135–145)

## 2023-07-05 LAB — GLUCOSE, CAPILLARY
Glucose-Capillary: 127 mg/dL — ABNORMAL HIGH (ref 70–99)
Glucose-Capillary: 190 mg/dL — ABNORMAL HIGH (ref 70–99)
Glucose-Capillary: 125 mg/dL — ABNORMAL HIGH (ref 70–99)

## 2023-07-05 LAB — HEPARIN LEVEL (UNFRACTIONATED): Heparin Unfractionated: 0.59 [IU]/mL (ref 0.30–0.70)

## 2023-07-05 LAB — APTT: aPTT: 50 s — ABNORMAL HIGH (ref 24–36)

## 2023-07-05 MED ORDER — APIXABAN 5 MG PO TABS
5.0000 mg | ORAL_TABLET | Freq: Two times a day (BID) | ORAL | Status: DC
  Administered 2023-07-05 – 2023-07-07 (×6): 5 mg via ORAL
  Filled 2023-07-05 (×7): qty 1

## 2023-07-05 MED ORDER — PROPOFOL 1000 MG/100ML IV EMUL
INTRAVENOUS | Status: AC
  Filled 2023-07-05: qty 100

## 2023-07-05 MED ORDER — HYDROCODONE-ACETAMINOPHEN 5-325 MG PO TABS
1.0000 | ORAL_TABLET | ORAL | Status: DC | PRN
  Administered 2023-07-06: 2 via ORAL
  Administered 2023-07-06: 1 via ORAL
  Administered 2023-07-07 – 2023-07-08 (×4): 2 via ORAL
  Filled 2023-07-05 (×5): qty 2
  Filled 2023-07-05: qty 1

## 2023-07-05 MED ORDER — HEPARIN SODIUM (PORCINE) 1000 UNIT/ML IJ SOLN
3200.0000 [IU] | Freq: Once | INTRAMUSCULAR | Status: AC
Start: 2023-07-05 — End: 2023-07-05
  Administered 2023-07-05: 3200 [IU] via INTRAVENOUS
  Filled 2023-07-05: qty 4

## 2023-07-05 MED ORDER — PHENYLEPHRINE HCL-NACL 20-0.9 MG/250ML-% IV SOLN
INTRAVENOUS | Status: AC
  Filled 2023-07-05: qty 250

## 2023-07-05 MED ORDER — DEXMEDETOMIDINE HCL IN NACL 80 MCG/20ML IV SOLN
INTRAVENOUS | Status: AC
  Filled 2023-07-05: qty 20

## 2023-07-05 NOTE — Progress Notes (Signed)
 OT Cancellation Note  Patient Details Name: [name] MRN: [id number] DOB: September 24, 1945   Cancelled Treatment:    Reason Eval/Treat Not Completed: Patient at procedure or test/ unavailable (Pt at HD, OT will follow-up with pt as able) 07/05/2023  AB, OTR/L  Acute Rehabilitation Services  Office: [phone]  Jorene New 07/05/2023, 10:16 AM

## 2023-07-05 NOTE — Progress Notes (Addendum)
  Inpatient Rehabilitation Admissions Coordinator   I contacted patient's daughter, Josephus Nida by phone. We discussed goals and expectations of a possible CIR admit. They prefer CIR for rehab. Family can provide expected caregiver support that is recommended . Patient lives with daughter in law, Danita Hall and family can provide support as recommended.I will begin insurance Auth with [facility] Medicare for possible CIR admit pending approval. Please call me with any questions. I contacted OT for the need for their eval to pursue Auth.  Jeannetta Millman, RN, MSN Rehab Admissions Coordinator [phone]

## 2023-07-05 NOTE — Progress Notes (Signed)
 PHARMACY - ANTICOAGULATION CONSULT NOTE  Pharmacy Consult for heparin  Indication: hx DVT and renal vein thrombus   Allergies  Allergen Reactions   Anesthetics, Halogenated Other (See Comments)    Per patient's daughter, she can not tolerate anesthetics.    Etomidate  Other (See Comments)    Heart stopped during anesthesia   Fentanyl  Other (See Comments)    Heart stopped during anesthesia    Percocet [Oxycodone -Acetaminophen ] Other (See Comments)    Headaches    Diazepam Other (See Comments)    Agitation   Lorazepam  Nausea Only   Tramadol  Hcl Nausea And Vomiting   Haloperidol Nausea And Vomiting    **Haldol**   Morphine  Sulfate Nausea Only    agitation   Propoxyphene Nausea And Vomiting    **Darvocet** no longer available   Versed  [Midazolam ] Other (See Comments)    Heart stopped during anesthesia    Patient Measurements: Height: (P) 5\' 4"  (162.6 cm) Weight: 75 kg (165 lb 5.5 oz) IBW/kg (Calculated) : (P) 54.7 HEPARIN  DW (KG): (P) 71  Vital Signs: Temp: 100.2 F (37.9 C) (04/18 0547) Temp Source: Oral (04/18 0547) BP: 145/63 (04/18 0547) Pulse Rate: 86 (04/18 0547)  Labs: Recent Labs    07/03/23 0507 07/03/23 0757 07/03/23 0757 07/03/23 1944 07/04/23 0448 07/05/23 0512  HGB  --  6.6*   < > 8.2* 7.9* 7.7*  HCT  --  21.6*   < > 25.5* 24.6* 24.7*  PLT  --  226  --   --  231 252  APTT 96*  --   --   --  82* 50*  HEPARINUNFRC 0.71*  --   --   --  0.58 0.59  CREATININE 4.81*  --   --   --   --  5.03*   < > = values in this interval not displayed.    Estimated Creatinine Clearance: 9.4 mL/min (A) (by C-G formula based on SCr of 5.03 mg/dL (H)).   Medical History: Past Medical History:  Diagnosis Date   Abdominal discomfort    Chronic N/V/D. Presumptive dx Crohn's dx per elevated p ANCA. Failed Entocort and Pentasa. Sep 2003 - ileocolectomy c anastomosis per Dr Duwaine Gins 2/2 adhesions - path was hegative for Crohns. EGD, Sm bowel follow through (11/03), and an  eteroclysis (10/03) were unrevealing. Cuases hypomag and hypocalcemia.   Adnexal mass 10/2001   s/p lap BSO (R ovarian fibroma) & lysis of adhesions   Allergy    Seasonal   Anemia    Multifactorial. Baseline HgB 10-11 ish. B12 def - 150 in 3/10. Fe Def - ferritin 35 3/10. Both are being repleted.   Breast cancer (HCC) 03/16/2013   right, 5 o'clock   CAD (coronary artery disease) 1996   1996 - PTCA and angioplasty diagonal branch. 2000 - Rotoblator & angiopllasty of diagonal. 2006 - subendocardial AMI, DES to proximal LAD.Aaron Aas Also had 90% stenosis in distal apical LAD. EF 55 with apical hypokinesis. Indefinite ASA and Plavix .   CHF (congestive heart failure) (HCC)    Chronic kidney disease, stage 4 (severe) (HCC)    Chronic pain    CT 10/10 = Spinal stenosis L2 - S1.   COVID-19 11/02/2020   Diabetes mellitus    Insulin  dependent   Diabetes mellitus type 2 in obese 05/03/2010   Managed on lantus  and novolog . Has diabetic nephropathy. Metformin D/C'd 2012 2/2 creatinine.  No diabetic retinopathy per 3/11.    Gout    Hx of radiation therapy 06/02/13- 07/16/13  right rbeast 4500 cGy 25 sessions, right breast boost 1600 cGy in 8 sessions   Hyperlipidemia    Managed with both a statin and Welchol . Welchol  stopped 2014 2/2 cost and started on fenofibrate     Hypertension    2006 B renal arteries patent. 2003 MRA - no RAS. 2003 pheo W/U Dr Francetta Innocent reportedly negative.   Hypoxia 07/23/2017   Lupus    Lymphedema of breast    Personal history of radiation therapy 2015   Pneumonia of left lung due to infectious organism    RBBB    Renal vein thrombosis (HCC)    SBO (small bowel obstruction) (HCC) 09/17/2017   Secondary hyperparathyroidism (HCC)    Stroke [facility])    Incidental finding MRI 2002 L lacunar infarct   Vitamin B12 deficiency    Vitamin D  deficiency    Wears dentures    top      Assessment: 78 yo female on apixaban  PTA for  hx DVT and renal vein thrombus admitted for AV graft steal  syndrome and now s/p AV graft excision. Pharmacy consulted for heparin  to start 4/15 per Dr. Fulton Job  Heparin  level is close to correlating with aPTT. APTT is 50 which is subtherapeutic with heparin  level 0.59 on 1650 units/hr.  Goal of Therapy:  Heparin  level 0.3-0.7 units/ml aPTT 66-102 seconds Monitor platelets by anticoagulation protocol: Yes   Plan:  Increase Heparin  to 1700 units/hr  F/u aPTT until correlates with heparin  level  Monitor daily aPTT, heparin  level, CBC, signs/symptoms of bleeding  F/u restart apixaban    Lovina Ruddle, PharmD, [facility] Clinical Pharmacist Please see AMION for all Pharmacists' Contact Phone Numbers 07/05/2023, 7:42 AM    07/05/2023, 7:40 AM

## 2023-07-05 NOTE — Consult Note (Signed)
 Physical Medicine and Rehabilitation Consult Reason for Consult: Evaluate appropriateness for Inpatient Rehab Referring Physician: Dr. Sunnie England    HPI: [name] is a 78 y.o. female with PMHx of  has a past medical history of Abdominal discomfort, Adnexal mass (10/2001), Allergy, Anemia, Breast cancer (HCC) (03/16/2013), CAD (coronary artery disease) (1996), CHF (congestive heart failure) (HCC), Chronic kidney disease, stage 4 (severe) (HCC), Chronic pain, COVID-19 (11/02/2020), Diabetes mellitus, Diabetes mellitus type 2 in obese (05/03/2010), Gout, radiation therapy (06/02/13- 07/16/13), Hyperlipidemia, Hypertension, Hypoxia (07/23/2017), Lupus, Lymphedema of breast, Personal history of radiation therapy (2015), Pneumonia of left lung due to infectious organism, RBBB, Renal vein thrombosis (HCC), SBO (small bowel obstruction) (HCC) (09/17/2017), Secondary hyperparathyroidism (HCC), Stroke (HCC), Vitamin B12 deficiency, Vitamin D  deficiency, and Wears dentures. . They were admitted to [facility] on 06/15/2023 for lower extremity swelling and weight gain of 20 pounds, shortness of breath at home.  She was admitted to the hospital for heart failure exacerbation in the setting of progressive renal disease.  Notable for recent hospital admissions for volume overload 3-8 to 3-15.  She was diuresed with IV diuretics with worsening renal function, ultimately had internal jugular tunneled catheter placed and left upper extremity fistula placed, started HD on 4-4.  On 4-7, she developed signs of cellulitis at the fistula site, and was started on antibiotics.  AV graft excision performed 4-13.  Postop, she developed leukocytosis and low-grade fever, was started on vancomycin  and Zosyn  initially but since antibiotics have been stopped.  Hospitalization was otherwise complicated by acute on chronic anemia, requiring blood transfusions on 3-30 and 4-16; type 2 diabetes, gout flare, poor pain  control, and depression.  PM&R was consulted to evaluate appropriateness for IPR admission.   Per chart review and patient, she lives in a two-level home with first-floor set up available, level entry.  She has family (multiple children) available 24/7 for assistance.  At baseline, she requires assistance for medications, driving, and uses a grab bar to transfer in and out of the shower but is otherwise independent of ADLs.  She is independent at home for mobility without an AD.  Currently, she is mod assist for transfers and ambulation up to 12 feet with a platform walker, limited by lower extremity weakness and pain in her bilateral knees and left arm.  Updated OT evaluations pending.   Review of Systems  Constitutional:  Negative for chills and fever.  Respiratory:  Negative for cough and shortness of breath.   Cardiovascular:  Negative for chest pain and palpitations.  Gastrointestinal:  Negative for abdominal pain, nausea and vomiting.  Musculoskeletal:  Positive for joint pain.       Bilateral shoulder and knee pain  Neurological:  Positive for weakness. Negative for dizziness, sensory change and headaches.  Psychiatric/Behavioral:  Negative for suicidal ideas.    Past Medical History:  Diagnosis Date   Abdominal discomfort    Chronic N/V/D. Presumptive dx Crohn's dx per elevated p ANCA. Failed Entocort and Pentasa. Sep 2003 - ileocolectomy c anastomosis per Dr Duwaine Gins 2/2 adhesions - path was hegative for Crohns. EGD, Sm bowel follow through (11/03), and an eteroclysis (10/03) were unrevealing. Cuases hypomag and hypocalcemia.   Adnexal mass 10/2001   s/p lap BSO (R ovarian fibroma) & lysis of adhesions   Allergy    Seasonal   Anemia    Multifactorial. Baseline HgB 10-11 ish. B12 def - 150 in 3/10. Fe Def - ferritin 35 3/10. Both are  being repleted.   Breast cancer (HCC) 03/16/2013   right, 5 o'clock   CAD (coronary artery disease) 1996   1996 - PTCA and angioplasty diagonal branch.  2000 - Rotoblator & angiopllasty of diagonal. 2006 - subendocardial AMI, DES to proximal LAD.Aaron Aas Also had 90% stenosis in distal apical LAD. EF 55 with apical hypokinesis. Indefinite ASA and Plavix .   CHF (congestive heart failure) (HCC)    Chronic kidney disease, stage 4 (severe) (HCC)    Chronic pain    CT 10/10 = Spinal stenosis L2 - S1.   COVID-19 11/02/2020   Diabetes mellitus    Insulin  dependent   Diabetes mellitus type 2 in obese 05/03/2010   Managed on lantus  and novolog . Has diabetic nephropathy. Metformin D/C'd 2012 2/2 creatinine.  No diabetic retinopathy per 3/11.    Gout    Hx of radiation therapy 06/02/13- 07/16/13   right rbeast 4500 cGy 25 sessions, right breast boost 1600 cGy in 8 sessions   Hyperlipidemia    Managed with both a statin and Welchol . Welchol  stopped 2014 2/2 cost and started on fenofibrate     Hypertension    2006 B renal arteries patent. 2003 MRA - no RAS. 2003 pheo W/U Dr Francetta Innocent reportedly negative.   Hypoxia 07/23/2017   Lupus    Lymphedema of breast    Personal history of radiation therapy 2015   Pneumonia of left lung due to infectious organism    RBBB    Renal vein thrombosis (HCC)    SBO (small bowel obstruction) (HCC) 09/17/2017   Secondary hyperparathyroidism (HCC)    Stroke (HCC)    Incidental finding MRI 2002 L lacunar infarct   Vitamin B12 deficiency    Vitamin D  deficiency    Wears dentures    top   Past Surgical History:  Procedure Laterality Date   ABDOMINAL HYSTERECTOMY     ARTERY REPAIR Left 07/01/2023   Procedure: REPAIR, ARTERY, BRACHIAL USING VEIN PATCH;  Surgeon: Young Hensen, MD;  Location: [facility] OR;  Service: Vascular;  Laterality: Left;   AVGG REMOVAL Left 07/01/2023   Procedure: REMOVAL OF ARTERIOVENOUS GORETEX GRAFT (AVGG);  Surgeon: Young Hensen, MD;  Location: [facility] OR;  Service: Vascular;  Laterality: Left;  LEFT ARM   BILATERAL SALPINGOOPHORECTOMY  8/03   Lap BSO (R ovarian fibroma) and adhesion lysis   BIOPSY   09/08/2020   Procedure: BIOPSY;  Surgeon: Lanita Pitman, MD;  Location: [facility] END[facility]COPY;  Service: Endoscopy;;   BOWEL RESECTION  2003   ileocolectomy with anastomosis 2/2 adhesions   BREAST BIOPSY Right 2015   BREAST BIOPSY Right 07/2014   BREAST LUMPECTOMY Right 04/22/2013   BREAST LUMPECTOMY WITH NEEDLE LOCALIZATION AND AXILLARY SENTINEL LYMPH NODE BX Right 04/22/2013   Procedure: BREAST LUMPECTOMY WITH NEEDLE LOCALIZATION AND AXILLARY SENTINEL LYMPH NODE BX;  Surgeon: Lockie Rima, MD;  Location: [facility];  Service: General;  Laterality: Right;   CARDIAC CATHETERIZATION     2 stents   CHOLECYSTECTOMY     COLONOSCOPY     COLONOSCOPY N/A 05/30/2023   Procedure: COLONOSCOPY;  Surgeon: Felecia Hopper, MD;  Location: [facility] ENDOSCOPY;  Service: Gastroenterology;  Laterality: N/A;   ESOPHAGOGASTRODUODENOSCOPY N/A 09/08/2020   Procedure: ESOPHAGOGASTRODUODENOSCOPY (EGD);  Surgeon: Lanita Pitman, MD;  Location: [facility];  Service: Endoscopy;  Laterality: N/A;   ESOPHAGOGASTRODUODENOSCOPY N/A 05/30/2023   Procedure: EGD (ESOPHAGOGASTRODUODENOSCOPY);  Surgeon: Felecia Hopper, MD;  Location: [facility] ENDOSCOPY;  Service: Gastroenterology;  Laterality: N/A;   HEMICOLECTOMY  R sided hemicolectomy   HERNIA REPAIR     Ventral hernia repair   INSERTION OF ARTERIOVENOUS (AV) ARTEGRAFT ARM Left 06/21/2023   Procedure: INSERTION, GRAFT, ARTERIOVENOUS, UPPER EXTREMITY;  Surgeon: Adine Hoof, MD;  Location: [facility] OR;  Service: Vascular;  Laterality: Left;   INSERTION OF DIALYSIS CATHETER Right 06/21/2023   Procedure: INSERTION OF DIALYSIS CATHETER;  Surgeon: Adine Hoof, MD;  Location: [facility] OR;  Service: Vascular;  Laterality: Right;   PTCA  4/06   Family History  Problem Relation Age of Onset   Breast cancer Mother 39   Pancreatic cancer Brother 8   Lung cancer Maternal Aunt    Breast cancer Maternal Aunt    Kidney failure Maternal Aunt    Prostate cancer  Maternal Uncle    Heart attack Maternal Grandmother    Breast cancer Maternal Grandmother    Colon cancer Maternal Grandmother    Clotting disorder Maternal Grandmother    Diabetes Maternal Grandmother    Hypertension Daughter    Pancreatic cancer Other    Social History:  reports that she has never smoked. She has never used smokeless tobacco. She reports that she does not drink alcohol and does not use drugs. Allergies:  Allergies  Allergen Reactions   Anesthetics, Halogenated Other (See Comments)    Per patient's daughter, she can not tolerate anesthetics.    Etomidate  Other (See Comments)    Heart stopped during anesthesia   Fentanyl  Other (See Comments)    Heart stopped during anesthesia    Percocet [Oxycodone -Acetaminophen ] Other (See Comments)    Headaches    Diazepam Other (See Comments)    Agitation   Lorazepam  Nausea Only   Tramadol  Hcl Nausea And Vomiting   Haloperidol Nausea And Vomiting    **Haldol**   Morphine  Sulfate Nausea Only    agitation   Propoxyphene Nausea And Vomiting    **Darvocet** no longer available   Versed  [Midazolam ] Other (See Comments)    Heart stopped during anesthesia   Medications Prior to Admission  Medication Sig Dispense Refill   acetaminophen  (TYLENOL ) 650 MG CR tablet Take 1,300 mg by mouth every 8 (eight) hours as needed for pain.     amLODipine  (NORVASC ) 10 MG tablet Take 1 tablet (10 mg total) by mouth daily. 90 tablet 3   apixaban  (ELIQUIS ) 2.5 MG TABS tablet Take 1 tablet (2.5 mg total) by mouth 2 (two) times daily. 60 tablet 0   colestipol  (COLESTID ) 1 g tablet Take 1 tablet (1 g total) by mouth 3 (three) times daily. 90 tablet 2   diphenoxylate -atropine  (LOMOTIL ) 2.5-0.025 MG tablet Take 1 tablet by mouth 2 (two) times daily as needed for diarrhea or loose stools. 30 tablet 1   famotidine  (PEPCID ) 20 MG tablet Take 20 mg by mouth daily as needed for heartburn or indigestion.     FLUoxetine  (PROZAC ) 20 MG capsule Take 20 mg by  mouth daily.     folic acid  (FOLVITE ) 1 MG tablet Take 1 tablet (1 mg total) by mouth daily. 30 tablet 2   furosemide  (LASIX ) 40 MG tablet Take 1 tablet (40 mg total) by mouth daily. (Patient taking differently: Take 40-80 mg by mouth See admin instructions. Take 1 tablet by mouth daily and may take an additional tablet for excess swelling) 90 tablet 2   gabapentin  (NEURONTIN ) 300 MG capsule Take 300 mg by mouth at bedtime.     HYDROcodone -acetaminophen  (NORCO/VICODIN) 5-325 MG tablet Take 2 tablets by mouth every 6 (six)  hours as needed. 10 tablet 0   lipase/protease/amylase (CREON ) 36000 UNITS CPEP capsule Take 1 capsule (36,000 Units total) by mouth 3 (three) times daily before meals. 180 capsule 2   loperamide  (IMODIUM ) 2 MG capsule Take 1 capsule (2 mg total) by mouth as needed for diarrhea or loose stools. 30 capsule 0   mirtazapine  (REMERON ) 7.5 MG tablet Take 7.5 mg by mouth at bedtime.     pantoprazole  (PROTONIX ) 40 MG tablet Take 1 tablet (40 mg total) by mouth 2 (two) times daily. (Patient taking differently: Take 40 mg by mouth daily.) 30 tablet 0   rivastigmine  (EXELON ) 1.5 MG capsule Take 1 capsule (1.5 mg total) by mouth 2 (two) times daily. 60 capsule 6   [EXPIRED] sevelamer  carbonate (RENVELA ) 800 MG tablet Take 1 tablet (800 mg total) by mouth 3 (three) times daily with meals. 90 tablet 0   [EXPIRED] sodium bicarbonate  650 MG tablet Take 1 tablet (650 mg total) by mouth 2 (two) times daily. 60 tablet 0   tiZANidine  (ZANAFLEX ) 2 MG tablet Take 2 mg by mouth at bedtime as needed for muscle spasms.     nitroGLYCERIN  (NITROSTAT ) 0.4 MG SL tablet DISSOLVE 1 TABLET UNDER THE TONGUE EVERY 5 MINUTES AS  NEEDED FOR CHEST PAIN. MAX  OF 3 TABLETS IN 15 MINUTES. CALL 911 IF PAIN PERSISTS. (Patient not taking: Reported on 06/15/2023) 25 tablet 0    Home: Home Living Family/patient expects to be discharged to:: Private residence Living Arrangements: Other relatives Available Help at Discharge:  Family, Available 24 hours/day Type of Home: House Home Access: Level entry Home Layout: Two level, 1/2 bath on main level, Able to live on main level with bedroom/bathroom Alternate Level Stairs-Number of Steps: 12 Alternate Level Stairs-Rails: Right Bathroom Shower/Tub: Engineer, manufacturing systems: Standard Bathroom Accessibility: Yes Home Equipment: Agricultural consultant (2 wheels), Rollator (4 wheels), Cane - single point  Functional History: Prior Function Prior Level of Function : Needs assist Mobility Comments: Rollator or RW ADLs Comments: had assist for medications, driving, was to get grabbar and shower chair after last stay but never delivered per grandaughter in the room Functional Status:  Mobility: Bed Mobility General bed mobility comments: in recliner Transfers Overall transfer level: Needs assistance Equipment used: Rolling walker (2 wheels) Transfers: Sit to/from Stand Sit to Stand: Mod assist General transfer comment: lifting assist with cues for hand placement and increased time from recliner, then to and from 3:1 over toilet in bathroom Ambulation/Gait Ambulation/Gait assistance: Min assist, Mod assist Gait Distance (Feet): 12 Feet (&8') Assistive device: Left platform walker Gait Pattern/deviations: Step-to pattern, Decreased stride length, Trunk flexed, Shuffle, Wide base of support General Gait Details: more difficulty progressing R LE than L and increased time with cues for walking forward to end of the bed, then noted she needed to toilet so turned with A for walker, balance and stepping to get to bathroom, after toileted walked to recliner    ADL: ADL Overall ADL's : Needs assistance/impaired Eating/Feeding: Independent, Sitting Grooming: Supervision/safety, Standing Upper Body Bathing: Standing, Supervision/ safety Upper Body Bathing Details (indicate cue type and reason): UE support Lower Body Bathing: Sit to/from stand, Supervison/ safety Lower Body  Bathing Details (indicate cue type and reason): UE support Upper Body Dressing : Set up, Sitting Lower Body Dressing: Set up, Sitting/lateral leans Toilet Transfer: Rolling walker (2 wheels), Supervision/safety, Ambulation Toileting- Clothing Manipulation and Hygiene: Supervision/safety, Sit to/from stand Functional mobility during ADLs: Supervision/safety, Rolling walker (2 wheels) General ADL Comments: Reinforced  education of CHF diet and exacerbation prevention. Pt negotiated 2 steps with CGA, eduacted pt to go up the stairs facing rail and using it as support  Cognition: Cognition Orientation Level: Oriented to person, Oriented to place Cognition Arousal: Alert Behavior During Therapy: [facility] for tasks assessed/performed  Blood pressure (!) 122/53, pulse 87, temperature (!) 100.6 F (38.1 C), resp. rate (!) 7, height (P) 5\' 4"  (1.626 m), weight 74.9 kg, SpO2 100%. Physical Exam  PE: Constitution: Appropriate appearance for age. No apparent distress +Obese Resp: No respiratory distress. No accessory muscle usage. on RA and CTAB Cardio: Well perfused appearance.  Regular rate and rhythm.  Trace b/l peripheral edema. Abdomen: Nondistended. Nontender.  Hypoactive bowel sounds. Psych: Appropriate mood and affect. Skin: LUE without erythema or edema apparent. Dialysis port c/d/I. No apparent rashes or lesions.   MSK:  Minimal AROM in bilateral shoulders and knees due to pain.  Unable to assess RUE PROM due to positioning with dialysis LUE SA limited to 90 degrees with PROM, with pain and gaurding throughout' + TTP superior and anterior shoulder No warmth, ? Mild palpable effusion L knee, none on R. + TTP suprapatellar bilaterally   Neuro:  AAO to self and place, partially to time. + Memory deficit and mild cognitive delays   DTRs: Reflexes were 2+ in bilateral achilles, patella, biceps, BR and triceps. Babinsky: flexor responses b/l.   Hoffmans: negative b/l Sensory exam: revealed  normal sensation in all dermatomal regions in bilateral upper extremities and bilateral lower extremities      Strength: testing limited d/t positioning of dialysis equipment and pain with mobility                RUE:  4/5 WE, 4/5 FF, 4/5 FA                LUE:  3/5 SA, 4/5 EF, 4/5 EE, 4/5 WE, 4/5 FF, 4/5 FA                RLE: 2/5 HF, 3/5 KE, 4/5  DF, 4/5  EHL, 4/5  PF                 LLE:  2/5 HF, 3/5 KE, 4/5  DF, 4/5  EHL, 4/5  PF   Coordination: Fine motor coordination was normal.       Results for orders placed or performed during the hospital encounter of 06/15/23 (from the past 24 hours)  Glucose, capillary     Status: Abnormal   Collection Time: 07/04/23 11:52 AM  Result Value Ref Range   Glucose-Capillary 143 (H) 70 - 99 mg/dL  Glucose, capillary     Status: Abnormal   Collection Time: 07/04/23  5:00 PM  Result Value Ref Range   Glucose-Capillary 191 (H) 70 - 99 mg/dL  Glucose, capillary     Status: Abnormal   Collection Time: 07/04/23  8:32 PM  Result Value Ref Range   Glucose-Capillary 191 (H) 70 - 99 mg/dL  CBC     Status: Abnormal   Collection Time: 07/05/23  5:12 AM  Result Value Ref Range   WBC 11.7 (H) 4.0 - 10.5 K/uL   RBC 2.69 (L) 3.87 - 5.11 MIL/uL   Hemoglobin 7.7 (L) 12.0 - 15.0 g/dL   HCT 14.7 (L) 82.9 - 56.2 %   MCV 91.8 80.0 - 100.0 fL   MCH 28.6 26.0 - 34.0 pg   MCHC 31.2 30.0 - 36.0 g/dL   RDW 13.0 (H) 86.5 -  15.5 %   Platelets 252 150 - 400 K/uL   nRBC 0.4 (H) 0.0 - 0.2 %  APTT     Status: Abnormal   Collection Time: 07/05/23  5:12 AM  Result Value Ref Range   aPTT 50 (H) 24 - 36 seconds  Heparin  level (unfractionated)     Status: None   Collection Time: 07/05/23  5:12 AM  Result Value Ref Range   Heparin  Unfractionated 0.59 0.30 - 0.70 IU/mL  Renal function panel     Status: Abnormal   Collection Time: 07/05/23  5:12 AM  Result Value Ref Range   Sodium 135 135 - 145 mmol/L   Potassium 3.8 3.5 - 5.1 mmol/L   Chloride 96 (L) 98 - 111 mmol/L    CO2 22 22 - 32 mmol/L   Glucose, Bld 98 70 - 99 mg/dL   BUN 58 (H) 8 - 23 mg/dL   Creatinine, Ser 1.61 (H) 0.44 - 1.00 mg/dL   Calcium  9.1 8.9 - 10.3 mg/dL   Phosphorus 4.8 (H) 2.5 - 4.6 mg/dL   Albumin  2.0 (L) 3.5 - 5.0 g/dL   GFR, Estimated 8 (L) >60 mL/min   Anion gap 17 (H) 5 - 15   *Note: Due to a large number of results and/or encounters for the requested time period, some results have not been displayed. A complete set of results can be found in Results Review.   No results found.  Assessment/Plan: Diagnosis: Debility due to [facility] complicated by renal failure, now on HD Does the need for close, 24 hr/day medical supervision in concert with the patient's rehab needs make it unreasonable for this patient to be served in a less intensive setting? Yes Co-Morbidities requiring supervision/potential complications:  acute on chronic anemia, requiring blood transfusions on 3-30 and 4-16; type 2 diabetes, gout flare, poor pain control, and depression.   Due to bladder management, bowel management, safety, skin/wound care, disease management, medication administration, pain management, and patient education, does the patient require 24 hr/day rehab nursing? Yes Does the patient require coordinated care of a physician, rehab nurse, therapy disciplines of PT, OT, and SLP to address physical and functional deficits in the context of the above medical diagnosis(es)? Yes Addressing deficits in the following areas: balance, endurance, locomotion, strength, transferring, bathing, dressing, feeding, grooming, toileting, and cognition Can the patient actively participate in an intensive therapy program of at least 3 hrs of therapy per day at least 5 days per week? Yes The potential for patient to make measurable gains while on inpatient rehab is good Anticipated functional outcomes upon discharge from inpatient rehab are supervision  with PT, supervision with OT, independent with SLP. Estimated rehab  length of stay to reach the above functional goals is: 10-14 days Anticipated discharge destination: Home Overall Rehab/Functional Prognosis: good  POST ACUTE RECOMMENDATIONS: This patient's condition is appropriate for continued rehabilitative care in the following setting: CIR - pending medical stability Patient has agreed to participate in recommended program. Potentially Note that insurance prior authorization may be required for reimbursement for recommended care.  Comment: [name] is a 78 year old female presenting with multiple recent hospitalizations for acute heart failure exacerbations, now complicated by acute renal failure with new hemodialysis.  She lives at home with a first-floor set up and level access, with multiple family members/children rotating through who are available 24/7, in addition to support from her local church group for meals.  Prior to recent hospitalizations, she was independent at home for most ADLs  and mobility, and she is making gains in current therapies but is limited by debility and proximal joint pains in her shoulders and knees.    She is currently stable off of IV pain medications with last dose 4-16, and mostly hemodynamically stable aside from slow downtrend in hemoglobin on a current heparin  infusion.  Once medically stable, she would be an excellent candidate for CIR.    MEDICAL RECOMMENDATIONS: Ongoing severe joint pains with active and passive range of motion in shoulders and knees; if no concern for ongoing infection, would consider a longer course of steroids for possible gout flare treatment If patient comes to inpatient rehab, we will coordinate with renal navigator to have her clipped within 1 to 2 weeks of anticipated discharge. Consider higher transfusion parameter for HgB <8 if patient is symptomatic (lethargic, orthostasis or tachycardia with therapies) given Hx cardiac disease   I have personally performed a face to face diagnostic  evaluation of this patient. Additionally, I have examined the patient's medical record including any pertinent labs and radiographic images. If the physician assistant has documented in this note, I have reviewed and edited or otherwise concur with the physician assistant's documentation.  Thanks,  Bea Lime, DO 07/05/2023

## 2023-07-05 NOTE — Progress Notes (Signed)
 Progress Note   Patient: [name] WUJ:[id number] DOB: 1946/03/16 DOA: 06/15/2023     19 DOS: the patient was seen and examined on 07/05/2023   Brief hospital course: [name] was admitted to the hospital with the working diagnosis of heart failure exacerbation in the setting of progressive renal disease.   78 yo female with the past medical history of diastolic heart failure, CKD 5, history of DVT, renal vein thrombosis, depression, type 2 diabetes mellitus, history of breast cancer, and systemic erythematous lupus who presented with dyspnea.  Recent hospitalization for volume overload due to heart failure 03/08 to 03/15, she was diuresed and discharged home.  Unfortunately at home she had progressive dyspnea on exertion, bilateral lower extremity edema and rapid weight gain (20 lbs since her discharge from the hospital).  On her initial physical examination her blood pressure was 160/70, HR 94 RR 17 and 02 saturation 97%. Lungs with no wheezing or rhonchi, heart with S1 and S2 present and regular with no gallops, rubs or murmurs, respiratory with no wheezing or rhonchi, abdomen with no distention and positive pitting bilateral lower extremity edema   Na 140, K 5.0 CL 109, bicarbonate 20, glucose 147 bun 82 cr 4,0  BNP 560  High sensitive troponin 125 and 117  Wbc 11,4 hgb 7,0 plt 192   Chest radiograph with hypoinflation, with mild cardiomegaly, bilateral hilar vascular congestion with cephalization of the vasculature and small bilateral pleural effusions.   EKG 88 bpm, normal axis, right bundle branch block, sinus rhythm with no significant ST segment or T wave changes.   Patient was placed on IV furosemide  for diuresis, and then transitioned to oral loop diuretics.  Her renal function continue to worsened and decision was made to start patient on renal replacement therapy.   04/04 patient had right internal jugular tunneled HD catheter placed and left upper extremity fistula  placed.  04/05 HD with good toleration.  04/06 improved volume status.   04/07 discharge has held due to low diastolic pressure. She developed signs of cellulitis on her left upper extremity fistula site.  04/08 started on antibiotic therapy.  4/9-10 onwards, persistent severe left arm pain 4/12 vascular surgery consulted, plan for AV graft excision 4/14 s/p excision of AV graft  4/15 new leukocytosis, body aches, temp 99.0 fever workup initiated, started empirically on Vanco and Zosyn  04/16 hgb 6,6 ordered one unit PRBC transfusion, HD today.  04/17 discontinue antibiotic therapy 04/18 changed from IV heparin  to apixaban .   Assessment and Plan: * Acute on chronic diastolic CHF (congestive heart failure) (HCC) Echocardiogram with preserved LV systolic function with EF 60 to 65%, moderate LVH, RV systolic function preserved, mild mitral valve regurgitation, mid tricuspid valve regurgitation, RVSP 33,9 mmHg, trivial pericardial effusion,    Volume status has improved.  Continue ultrafiltration for volume management.   Limited medical therapy due to risk of hypotension.   Chronic kidney disease (CKD), stage V (HCC) Progressed to ESRD no on renal replacement therapy per HD  Has a left upper extremity fistula has been removed   Anemia of chronic renal disease.  SP EPO and IV iron   Hgb 7.7   Metabolic bone disease, continue with sevelamer .   History of renal vein thrombosis in 2019 and deep vein thrombosis 2022  Anticoagulation with apixaban .   Left upper extremity with clinical signs of acute cellulitis (in the setting of new AVF fistula creation). Fistula has been removed 04/13 Wbc is 11,7, patient has been afebrile, cultures  are no growth.  OP note from fistula excision with no signs of local infection.  Stopped antibiotics 04/17.    Essential hypertension Continue fluid management per HD   Acute on chronic anemia Acute on chronic anemia of chronic renal disease.  03./30  PRBC transfusion x 1 04/16 PRBC transfusion x1   GERD (gastroesophageal reflux disease) Continue antiacid therapy with pantoprazole .   Type 2 diabetes mellitus (HCC) Patient is on insulin  sliding scale for glucose cover and monitoring.   Gout flare Patient was treated with 3 days of prednisone  with good toleration   CAD (coronary artery disease) SP PCI in 2000 No chest pain, no acute coronary syndrome   Depression Continue with rivastigmine , mirtazapine  and fluoxetine    Breast cancer of lower-inner quadrant of right female breast (HCC) Follow up as outpatient.       Subjective: Patient is feeling better, no chest pain or dyspnea, her left arm pain has improved.   Physical Exam: Vitals:   07/05/23 1200 07/05/23 1220 07/05/23 1222 07/05/23 1228  BP: 91/64 93/75 (!) 111/52 (!) 128/48  Pulse: 85 (!) 102 99 (!) 102  Resp: (!) 25 15 12 16   Temp:  99.1 F (37.3 C) 99.1 F (37.3 C)   TempSrc:      SpO2: 98% 99% 99% 99%  Weight:    71.9 kg  Height:       Neurology awake and alert ENT with mild pallor with no icterus Cardiovascular with S1 and S2 present and regular Respiratory with no rales or wheezing Abdomen with no distention  No lower extremity edema  Data Reviewed:    Family Communication: no family at the bedside,   Disposition: Status is: Inpatient Remains inpatient appropriate because: pending transfer to CIR, possible 04/21  Planned Discharge Destination: Rehab    Author: Albertus Alt, MD 07/05/2023 3:12 PM  For on call review [email or web address].

## 2023-07-05 NOTE — Plan of Care (Signed)
  Problem: Metabolic: Goal: Ability to maintain appropriate glucose levels will improve Outcome: Progressing   Problem: Nutritional: Goal: Maintenance of adequate nutrition will improve Outcome: Progressing   Problem: Skin Integrity: Goal: Risk fo

## 2023-07-05 NOTE — Progress Notes (Signed)
 Received patient in bed to unit.  Alert and oriented.  Informed consent signed and in chart.   TX duration:3.5 hours  Patient tolerated well.  Transported back to the room  Alert, without acute distress.  Hand-off given to patient's nurse.   Acc

## 2023-07-05 NOTE — Evaluation (Signed)
 Occupational Therapy Evaluation Patient Details Name: [name] MRN: [id number] DOB: [birth year] Today's Date: 07/05/2023   History of Present Illness   Patient is a 78 yo female presenting [facility] on [facility]/15/23 with worsening DOE and fluid overload.  Now on ESRD on HD.  Developed cellulitis vs. Steal syndrome L AV graft and had graft excision on 07/01/23.  PMH:  CKD IV, HTN, CHF, CAD, DVT, CVA, DM2, gout, chronic gastritis, breast CA, dementia, depression.     Clinical Impressions PTA pt lived with family and had assist for medication management and driving, typically ind with ADLs at baseline and ambulatory with Rollator or RW. Pt currently presenting with impaired cognition, needing verbal cues to sequence mobility, pt requiring mod A + L platform walker to ambulate. She is generally weak with noted LUE edema, educated pt on benefits of AROM of LUE and encourage functional use and benefits of elevation with it propped on pillows. At this time pt needing mod A to CGA for ADLs and is not close to functional baseline. OT to continue following pt acutely to address listed deficits and help progress pt as able. Patient has the potential to reach Mod I and demos the ability to tolerate 3 hours of therapy. Pt would benefit from an intensive rehab program to help maximize functional independence.       If plan is discharge home, recommend the following:   Assist for transportation;Direct supervision/assist for medications management;Direct supervision/assist for financial management;A lot of help with walking and/or transfers;A lot of help with bathing/dressing/bathroom     Functional Status Assessment   Patient has had a recent decline in their functional status and demonstrates the ability to make significant improvements in function in a reasonable and predictable amount of time.     Equipment Recommendations    (defer)     Recommendations for Other Services          Precautions/Restrictions   Precautions Precautions: Fall Recall of Precautions/Restrictions: Impaired Precaution/Restrictions Comments: L UE edema at site with AVG excision Restrictions Weight Bearing Restrictions Per Provider Order: No     Mobility Bed Mobility Overal bed mobility: Needs Assistance Bed Mobility: Supine to Sit   Sidelying to sit: Mod assist Supine to sit: Mod assist     General bed mobility comments: Mod A to assist pt with BLE mobility and raise trunk    Transfers Overall transfer level: Needs assistance Equipment used: Left platform walker Transfers: Sit to/from Stand, Bed to chair/wheelchair/BSC Sit to Stand: Max assist     Step pivot transfers: Mod assist     General transfer comment: Max A to rise into standing from EOB, Mod A to sequence and take steps while maintaining RW.      Balance Overall balance assessment: Needs assistance Sitting-balance support: No upper extremity supported, Feet supported Sitting balance-Leahy Scale: Fair Sitting balance - Comments: sitting EOB no overt LOB   Standing balance support: Bilateral upper extremity supported, Reliant on assistive device for balance Standing balance-Leahy Scale: Poor Standing balance comment: Reliant on plat walker                           ADL either performed or assessed with clinical judgement   ADL Overall ADL's : Needs assistance/impaired Eating/Feeding: Sitting;Set up;Bed level   Grooming: Wash/dry hands;Minimal assistance;Standing Grooming Details (indicate cue type and reason): min A to allow for assist with setting up soap Upper Body Bathing: Sitting;Contact guard assist  Lower Body Bathing: Sitting/lateral leans;Minimal assistance   Upper Body Dressing : Sitting;Minimal assistance   Lower Body Dressing: Sitting/lateral leans;Moderate assistance   Toilet Transfer: Rolling walker (2 wheels);Moderate assistance;Stand-pivot   Toileting- Clothing  Manipulation and Hygiene: Sit to/from stand;Contact guard assist Toileting - Clothing Manipulation Details (indicate cue type and reason): pt wiping self while seated on toilet.     Functional mobility during ADLs: Moderate assistance;Rolling walker (2 wheels)       Vision         Perception         Praxis         Pertinent Vitals/Pain Pain Assessment Pain Assessment: 0-10 Pain Score: 8  Pain Location: back Pain Descriptors / Indicators: Aching, Discomfort Pain Intervention(s): Monitored during session, Limited activity within patient's tolerance, Patient requesting pain meds-RN notified     Extremity/Trunk Assessment Upper Extremity Assessment RUE Deficits / Details: AAROM WFL, limited AROM shoulder flexion to about 80 degrees and painful with limited external rotation; strength shoulder flexion 2+/5, elbow extension 2/5, elbow flexion 4-/5 LUE Deficits / Details: Edema in hand and forearm. Shoulder AROM limited to ~90 degree, 3/5 elbow ext/flexion and shoulder 2/5. ROM for elbow flex/ext WFL. LUE: Shoulder pain with ROM LUE Sensation: WNL           Communication Communication Communication: No apparent difficulties   Cognition Arousal: Alert Behavior During Therapy: WFL for tasks assessed/performed Cognition: History of cognitive impairments             OT - Cognition Comments: Pt plesantly confused, A&O self only and thinks that she is in her home. Continues to ask to go to her room despite her being in her room already.                 Following commands: Intact       Cueing  General Comments   Cueing Techniques: Verbal cues  Enocuraged use of sqeeze toy for LUE   Exercises Other Exercises Other Exercises: Squeeze toy LUE x15 reps   Shoulder Instructions      Home Living Family/patient expects to be discharged to:: Private residence Living Arrangements:  (lives with sister in law, Valeda Garter) Available Help at Discharge:  Family;Available 24 hours/day Type of Home: House Home Access: Level entry     Home Layout: Two level;1/2 bath on main level;Able to live on main level with bedroom/bathroom Alternate Level Stairs-Number of Steps: 12 Alternate Level Stairs-Rails: Right Bathroom Shower/Tub: Chief Strategy Officer: Standard Bathroom Accessibility: Yes   Home Equipment: Agricultural consultant (2 wheels);Rollator (4 wheels);Cane - single point   Additional Comments: information obtained from chart, patient confused on eval  Lives With: Family    Prior Functioning/Environment Prior Level of Function : Needs assist             Mobility Comments: Rollator or RW ADLs Comments: had assist for medications, driving, was to get grabbar and shower chair after last stay but never delivered per grandaughter in the room    OT Problem List: Decreased cognition;Impaired balance (sitting and/or standing);Increased edema;Decreased strength   OT Treatment/Interventions: Self-care/ADL training;Therapeutic exercise;Energy conservation;DME and/or AE instruction;Manual therapy;Therapeutic activities;Patient/family education;Balance training      OT Goals(Current goals can be found in the care plan section)   Acute Rehab OT Goals Patient Stated Goal: To get better OT Goal Formulation: With patient Time For Goal Achievement: 07/19/23 Potential to Achieve Goals: Good   OT Frequency:  Min 2X/week    Co-evaluation  AM-PAC OT "6 Clicks" Daily Activity     Outcome Measure Help from another person eating meals?: A Little Help from another person taking care of personal grooming?: A Little Help from another person toileting, which includes using toliet, bedpan, or urinal?: A Lot Help from another person bathing (including washing, rinsing, drying)?: A Lot Help from another person to put on and taking off regular upper body clothing?: A Little Help from another person to put on and taking off  regular lower body clothing?: A Lot 6 Click Score: 15   End of Session Equipment Utilized During Treatment: Gait belt;Other (comment) (L plat walker) Nurse Communication: Mobility status  Activity Tolerance: Patient tolerated treatment well Patient left: in bed;with call bell/phone within reach;with bed alarm set  OT Visit Diagnosis: Other abnormalities of gait and mobility (R26.89);Muscle weakness (generalized) (M62.81);Other symptoms and signs involving cognitive function                Time: 1914-7829 OT Time Calculation (min): 28 min Charges:  OT General Charges $OT Visit: 1 Visit OT Evaluation $OT Eval Moderate Complexity: 1 Mod OT Treatments $Self Care/Home Management : 8-22 mins  07/05/2023  AB, OTR/L  Acute Rehabilitation Services  Office: [phone]   Jorene New 07/05/2023, 3:42 PM

## 2023-07-05 NOTE — Procedures (Signed)
 I was present at this dialysis session. I have reviewed the session itself and made appropriate changes.   Vital signs in last 24 hours:  Temp:  [99 F (37.2 C)-101.6 F (38.7 C)] 100.6 F (38.1 C) (04/18 0826) Pulse Rate:  [80-99] 81 (04/18 0915) Resp:  [8-19] 9 (04/18 0915) BP: (94-159)/(52-87) 124/56 (04/18 0915) SpO2:  [97 %-100 %] 100 % (04/18 0915) Weight:  [74.9 kg] 74.9 kg (04/18 0826) Weight change:  Filed Weights   07/03/23 1634 07/05/23 0823 07/05/23 0826  Weight: 75 kg 74.9 kg 74.9 kg    Recent Labs  Lab 07/05/23 0512  NA 135  K 3.8  CL 96*  CO2 22  GLUCOSE 98  BUN 58*  CREATININE 5.03*  CALCIUM  9.1  PHOS 4.8*    Recent Labs  Lab 07/01/23 2200 07/02/23 0339 07/03/23 0757 07/03/23 1944 07/04/23 0448 07/05/23 0512  WBC 10.2   < > 12.1*  --  11.9* 11.7*  NEUTROABS 8.3*  --   --   --   --   --   HGB 6.9*   < > 6.6* 8.2* 7.9* 7.7*  HCT 22.7*   < > 21.6* 25.5* 24.6* 24.7*  MCV 95.4   < > 92.7  --  90.4 91.8  PLT 231   < > 226  --  231 252   < > = values in this interval not displayed.    Scheduled Meds:  sodium chloride    Intravenous Once   Chlorhexidine  Gluconate Cloth  6 each Topical Q0600   colestipol   1 g Oral TID   darbepoetin (ARANESP ) injection - DIALYSIS  150 mcg Subcutaneous Q Sun-1800   FLUoxetine   20 mg Oral Daily   folic acid   1 mg Oral Daily   gabapentin   300 mg Oral QHS   heparin  sodium (porcine)  3,200 Units Intravenous Once   insulin  aspart  0-6 Units Subcutaneous TID WC   lipase/protease/amylase  36,000 Units Oral TID AC   mirtazapine   7.5 mg Oral QHS   multivitamin  1 tablet Oral QHS   pantoprazole   40 mg Oral BID   polyethylene glycol  17 g Oral Daily   rivastigmine   1.5 mg Oral BID   sevelamer  carbonate  800 mg Oral TID WC   torsemide   100 mg Oral Q T,Th,S,Su   Continuous Infusions:  heparin  1,700 Units/hr (07/05/23 0828)   PRN Meds:.acetaminophen , HYDROcodone -acetaminophen , HYDROmorphone  (DILAUDID ) injection, tiZANidine     Fairy DELENA Sellar,  MD 07/05/2023, 9:29 AM

## 2023-07-05 NOTE — PMR Pre-admission (Signed)
 PMR Admission Coordinator Pre-Admission Assessment  Patient: Angeliah Swaz[place] [name] is an 78 y.o., female MRN: [id number] DOB: [birth year] Height: (P) 5\' 4"  (162.6 cm) Weight: 71.9 kg              Insurance Information HMO: yes    PPO:      PCP:      IPA:      80/20:      OTHER:  PRIMARYElihu Grumet Medicare HMO      Policy#: [id number]      Subscriber: pt CM Name: Maurene Sours  Phone#: [phone]   (ext [phone])   Fax#: [phone]     Pt. Approved via fax from Rodena Citron for admit 07/08/23 for admit 2/44/01-0/27/25   Pre-Cert#: [id number]      Employer:  Benefits:  Phone #: [phone]      Name:  Eff Date: 05/18/2023- 03/18/2024   Deductible: does not have one   OOP Max: $9,350 ($6,433.29 met)   CIR: $399/day co-pay with a max co-pay of $2,793/admission (7 days)   SNF: $10/day co-pay for days 1-20, $214/day co-pay for days 21-100, limited to 100 days/cal yr   Outpatient:  $25 copay/visit Home Health:  100% coverage   DME: 80% coverage; 20% co-insurance   Providers: in network SECONDARYJona Negro      Policy#: [id number]      Phone#:   Financial Counselor:       Phone#:   The "Data Collection Information Summary" for patients in Inpatient Rehabilitation Facilities with attached "Privacy Act Statement-Health Care Records" was provided and verbally reviewed with: Patient and Family  Emergency Contact Information Contact Information     Name Relation Home Work [place] Daughter   [phone]      Other Contacts     Name Relation Home Work Mobile   [place] Daughter [phone]    hill,jazmine Granddaughter   [phone]   Hill,Carmen Daughter [phone]     Hill,Jessica Granddaughter   [phone]      Current Medical History  Patient Admitting Diagnosis: Debility due to CHF complicated by renal failure  History of Present Illness: [name] is a 78 y.o. female with PMHx of  has a past medical history of Abdominal discomfort, Adnexal  mass, Anemia, Breast cancer, CAD, CHF, Chronic kidney disease, stage 4, Chronic pain, COVID-19 (11/02/2020), Diabetes mellitus type 2, Gout, Hyperlipidemia, Hypertension, Lupus, SBO, Secondary hyperparathyroidism, Stroke, Vitamin B12 deficiency, Vitamin D  deficiency, and Wears dentures. She was admitted to [facility] on 06/15/2023 for lower extremity swelling and weight gain of 20 pounds, shortness of breath at home.    Felt to be heart failure exacerbation in the setting of progressive renal disease.  Notable for recent hospital admissions for volume overload 3/8 to 3/15.  She was diuresed with IV diuretics with worsening renal function, ultimately had internal jugular tunneled catheter placed and left upper extremity fistula placed, started HD on 4/4.  On 4/7, she developed signs of cellulitis at the fistula site, and was started on antibiotics.  AV graft excision performed 4/13.  Postop, she developed leukocytosis and low-grade fever, was started on vancomycin  and Zosyn  initially but since antibiotics have been stopped.  Hospitalization was otherwise complicated by acute on chronic anemia, requiring blood transfusions on 3/30 and 4/16; type 2 diabetes, gout flare, poor pain control, and depression    Patient's medical record from [facility] has been reviewed by the rehabilitation admission coordinator and physician.  Past Medical History  Past Medical History:  Diagnosis Date   Abdominal discomfort    Chronic N/V/D. Presumptive dx Crohn's dx per elevated p ANCA. Failed Entocort and Pentasa. Sep 2003 - ileocolectomy c anastomosis per Dr Duwaine Gins 2/2 adhesions - path was hegative for Crohns. EGD, Sm bowel follow through (11/03), and an eteroclysis (10/03) were unrevealing. Cuases hypomag and hypocalcemia.   Adnexal mass 10/2001   s/p lap BSO (R ovarian fibroma) & lysis of adhesions   Allergy    Seasonal   Anemia    Multifactorial. Baseline HgB 10-11 ish. B12 def - 150 in 3/10. Fe Def  - ferritin 35 3/10. Both are being repleted.   Breast cancer (HCC) 03/16/2013   right, 5 o'clock   CAD (coronary artery disease) 1996   1996 - PTCA and angioplasty diagonal branch. 2000 - Rotoblator & angiopllasty of diagonal. 2006 - subendocardial AMI, DES to proximal LAD.Aaron Aas Also had 90% stenosis in distal apical LAD. EF 55 with apical hypokinesis. Indefinite ASA and Plavix .   CHF (congestive heart failure) (HCC)    Chronic kidney disease, stage 4 (severe) (HCC)    Chronic pain    CT 10/10 = Spinal stenosis L2 - S1.   COVID-19 11/02/2020   Diabetes mellitus    Insulin  dependent   Diabetes mellitus type 2 in obese 05/03/2010   Managed on lantus  and novolog . Has diabetic nephropathy. Metformin D/C'd 2012 2/2 creatinine.  No diabetic retinopathy per 3/11.    Gout    Hx of radiation therapy 06/02/13- 07/16/13   right rbeast 4500 cGy 25 sessions, right breast boost 1600 cGy in 8 sessions   Hyperlipidemia    Managed with both a statin and Welchol . Welchol  stopped 2014 2/2 cost and started on fenofibrate     Hypertension    2006 B renal arteries patent. 2003 MRA - no RAS. 2003 pheo W/U Dr Francetta Innocent reportedly negative.   Hypoxia 07/23/2017   Lupus    Lymphedema of breast    Personal history of radiation therapy 2015   Pneumonia of left lung due to infectious organism    RBBB    Renal vein thrombosis (HCC)    SBO (small bowel obstruction) (HCC) 09/17/2017   Secondary hyperparathyroidism (HCC)    Stroke [facility])    Incidental finding MRI 2002 L lacunar infarct   Vitamin B12 deficiency    Vitamin D  deficiency    Wears dentures    top   Has the patient had major surgery during 100 days prior to admission? Yes  Family History  family history includes Breast cancer in her maternal aunt and maternal grandmother; Breast cancer (age of onset: 52) in her mother; Clotting disorder in her maternal grandmother; Colon cancer in her maternal grandmother; Diabetes in her maternal grandmother; Heart attack in  her maternal grandmother; Hypertension in her daughter; Kidney failure in her maternal aunt; Lung cancer in her maternal aunt; Pancreatic cancer in an other family member; Pancreatic cancer (age of onset: 9) in her brother; Prostate cancer in her maternal uncle.   Current Medications   Current Facility-Administered Medications:    0.9 %  sodium chloride  infusion (Manually program via Guardrails IV Fluids), , Intravenous, Once, Arrien, Curlee Doss, MD   acetaminophen  (TYLENOL ) tablet 650 mg, 650 mg, Oral, Q6H PRN, Opyd, Timothy S, MD, 650 mg at 07/03/23 1423   apixaban  (ELIQUIS ) tablet 5 mg, 5 mg, Oral, BID, Arrien, Mauricio Daniel, MD   Chlorhexidine  Gluconate Cloth 2 % PADS 6 each, 6 each, Topical, Q0600, Goldsborough, Kellie, MD,  6 each at 07/05/23 0550   colestipol  (COLESTID ) tablet 1 g, 1 g, Oral, TID, Rhyne, Samantha J, PA-C, 1 g at 07/04/23 2134   Darbepoetin Alfa  (ARANESP ) injection 150 mcg, 150 mcg, Subcutaneous, Q Sun-1800, Goldsborough, Kellie, MD, 150 mcg at 06/30/23 1800   FLUoxetine  (PROZAC ) capsule 20 mg, 20 mg, Oral, Daily, Rhyne, Samantha J, PA-C, 20 mg at 07/04/23 4098   folic acid  (FOLVITE ) tablet 1 mg, 1 mg, Oral, Daily, Rhyne, Samantha J, PA-C, 1 mg at 07/04/23 1191   gabapentin  (NEURONTIN ) capsule 300 mg, 300 mg, Oral, QHS, Rhyne, Samantha J, PA-C, 300 mg at 07/04/23 2134   HYDROcodone -acetaminophen  (NORCO/VICODIN) 5-325 MG per tablet 1-2 tablet, 1-2 tablet, Oral, Q4H PRN, Eveland, Matthew, PA-C, 2 tablet at 07/04/23 0848   HYDROmorphone  (DILAUDID ) injection 0.5 mg, 0.5 mg, Intravenous, Q3H PRN, Deforest Fast, MD, 0.5 mg at 07/03/23 1834   insulin  aspart (novoLOG ) injection 0-6 Units, 0-6 Units, Subcutaneous, TID WC, Arrien, Mauricio Daniel, MD, 1 Units at 07/04/23 1713   lipase/protease/amylase (CREON ) capsule 36,000 Units, 36,000 Units, Oral, TID AC, Rhyne, Samantha J, PA-C, 36,000 Units at 07/05/23 0550   mirtazapine  (REMERON ) tablet 7.5 mg, 7.5 mg, Oral, QHS, Rhyne,  Samantha J, PA-C, 7.5 mg at 07/04/23 2134   multivitamin (RENA-VIT) tablet 1 tablet, 1 tablet, Oral, QHS, Rhyne, Samantha J, PA-C, 1 tablet at 07/04/23 2134   pantoprazole  (PROTONIX ) EC tablet 40 mg, 40 mg, Oral, BID, Rhyne, Samantha J, PA-C, 40 mg at 07/04/23 2134   polyethylene glycol (MIRALAX  / GLYCOLAX ) packet 17 g, 17 g, Oral, Daily, Rhyne, Samantha J, PA-C, 17 g at 07/04/23 0848   rivastigmine  (EXELON ) capsule 1.5 mg, 1.5 mg, Oral, BID, Rhyne, Samantha J, PA-C, 1.5 mg at 07/04/23 1712   sevelamer  carbonate (RENVELA ) tablet 800 mg, 800 mg, Oral, TID WC, Rhyne, Samantha J, PA-C, 800 mg at 07/04/23 1711   tiZANidine  (ZANAFLEX ) tablet 2 mg, 2 mg, Oral, Q8H PRN, Rhyne, Samantha J, PA-C, 2 mg at 07/04/23 4782   torsemide  (DEMADEX ) tablet 100 mg, 100 mg, Oral, Q T,Th,S,Su, Leandra Pro, MD, 100 mg at 07/04/23 0841  Patients Current Diet:  Diet Order             Diet renal with fluid restriction Fluid consistency: Thin  Diet effective now           Diet - low sodium heart healthy                   Precautions / Restrictions Precautions Precautions: Fall Precaution/Restrictions Comments: L UE edema at site with AVG excision Restrictions Weight Bearing Restrictions Per Provider Order: No   Has the patient had 2 or more falls or a fall with injury in the past year?No  Prior Activity Level Limited Community (1-2x/wk): supervision with RW, did not drive  Prior Functional Level Prior Function Prior Level of Function : Needs assist Mobility Comments: Rollator or RW ADLs Comments: had assist for medications, driving, was to get grabbar and shower chair after last stay but never delivered per grandaughter in the room  Self Care: Did the patient need help bathing, dressing, using the toilet or eating?  Independent  Indoor Mobility: Did the patient need assistance with walking from room to room (with or without device)? Independent  Stairs: Did the patient need assistance with  internal or external stairs (with or without device)? Independent  Functional Cognition: Did the patient need help planning regular tasks such as shopping or remembering to take medications? Needed some  help  Patient Information Are you of Hispanic, Latino/a,or Spanish origin?: A. No, not of Hispanic, Latino/a, or Spanish origin What is your race?: B. Black or African American Do you need or want an interpreter to communicate with a doctor or health care staff?: 0. No  Patient's Response To:  Health Literacy and Transportation Is the patient able to respond to health literacy and transportation needs?: Yes Health Literacy - How often do you need to have someone help you when you read instructions, pamphlets, or other written material from your doctor or pharmacy?: Never In the past 12 months, has lack of transportation kept you from medical appointments or from getting medications?: No In the past 12 months, has lack of transportation kept you from meetings, work, or from getting things needed for daily living?: No  Home Assistive Devices / Equipment Home Equipment: Agricultural consultant (2 wheels), Rollator (4 wheels), [employer] - single point  Prior Device Use: Indicate devices/aids used by the patient prior to current illness, exacerbation or injury? Walker and cane  Current Functional Level Cognition  Orientation Level: Oriented to person, Oriented to place    Extremity Assessment (includes Sensation/Coordination)  Upper Extremity Assessment: LUE deficits/detail, RUE deficits/detail RUE Deficits / Details: AAROM WFL, limited AROM shoulder flexion to about 80 degrees and painful with limited external rotation; strength shoulder flexion 2+/5, elbow extension 2/5, elbow flexion 4-/5 RUE Sensation: WNL LUE Deficits / Details: edema in hand and arm with limited grip esp ring and pinky finger with testing (Though able to take meds in a cup with L hand); elbow extension tight but WFL, flexion AROM WFL  though slow and needed encouragement, shoulder AAROM limited by pain about 100 degrees, strength grossly 3/5 elbow, 2/5 shoulder LUE: Shoulder pain with ROM  Lower Extremity Assessment: RLE deficits/detail, LLE deficits/detail RLE Deficits / Details: AAROM limited in sitting with legs elevated c/o pain in legs with resisted ankle DF and strength hip 2/5, knee extension 4-/5 LLE Deficits / Details: AAROM limited in sitting with legs elevated c/o pain in legs with resisted ankle DF and strength hip 2/5, knee extension 4-/5    ADLs  Overall ADL's : Needs assistance/impaired Eating/Feeding: Independent, Sitting Grooming: Supervision/safety, Standing Upper Body Bathing: Standing, Supervision/ safety Upper Body Bathing Details (indicate cue type and reason): UE support Lower Body Bathing: Sit to/from stand, Supervison/ safety Lower Body Bathing Details (indicate cue type and reason): UE support Upper Body Dressing : Set up, Sitting Lower Body Dressing: Set up, Sitting/lateral leans Toilet Transfer: Rolling walker (2 wheels), Supervision/safety, Ambulation Toileting- Clothing Manipulation and Hygiene: Supervision/safety, Sit to/from stand Functional mobility during ADLs: Supervision/safety, Rolling walker (2 wheels) General ADL Comments: Reinforced education of CHF diet and exacerbation prevention. Pt negotiated 2 steps with CGA, eduacted pt to go up the stairs facing rail and using it as support    Mobility  General bed mobility comments: in recliner    Transfers  Overall transfer level: Needs assistance Equipment used: Rolling walker (2 wheels) Transfers: Sit to/from Stand Sit to Stand: Mod assist General transfer comment: lifting assist with cues for hand placement and increased time from recliner, then to and from 3:1 over toilet in bathroom    Ambulation / Gait / Stairs / Wheelchair Mobility  Ambulation/Gait Ambulation/Gait assistance: Min assist, Mod assist Gait Distance (Feet): 12  Feet (&8') Assistive device: Left platform walker Gait Pattern/deviations: Step-to pattern, Decreased stride length, Trunk flexed, Shuffle, Wide base of support General Gait Details: more difficulty progressing R LE than  L and increased time with cues for walking forward to end of the bed, then noted she needed to toilet so turned with A for walker, balance and stepping to get to bathroom, after toileted walked to recliner    Posture / Balance Dynamic Sitting Balance Sitting balance - Comments: in recliner with feet on the floor no LOB Balance Overall balance assessment: Needs assistance Sitting-balance support: No upper extremity supported, Feet supported Sitting balance-Leahy Scale: Fair Sitting balance - Comments: in recliner with feet on the floor no LOB Standing balance support: Bilateral upper extremity supported Standing balance-Leahy Scale: Poor Standing balance comment: UE support needed for balance and with turning walker LOB posterior mod A to recover    Special needs/care consideration OP hemodialysis arranged with Renal navigator MWF at [facility] [place] application completed     Previous Home Environment  Living Arrangements:  (lives with sister in Social worker, [place])  Lives With: Family Available Help at Discharge: Family, Available 24 hours/day Type of Home: House Home Layout: Two level, 1/2 bath on main level, Able to live on main level with bedroom/bathroom Alternate Level Stairs-Rails: Right Alternate Level Stairs-Number of Steps: 12 Home Access: Level entry Bathroom Shower/Tub: Associate Professor: Yes Home Care Services: Yes Type of Home Care Services: Home OT, Homehealth aide, Home PT (SW) Home Care Agency (if known): [facility] Additional Comments: information obtained from chart, patient with dementia at baseline  Discharge Living Setting Plans for Discharge Living Setting: Lives with (comment)  (daughter in law, Danita) Type of Home at Discharge: House Discharge Home Layout: Two level, 1/2 bath on main level, Able to live on main level with bedroom/bathroom Alternate Level Stairs-Rails: Right Alternate Level Stairs-Number of Steps: 12 Discharge Home Access: Level entry Discharge Bathroom Shower/Tub: Tub/shower unit Discharge Bathroom Toilet: Standard Discharge Bathroom Accessibility: Yes How Accessible: Accessible via walker Does the patient have any problems obtaining your medications?: No  Social/Family/Support Systems Patient Roles: Parent Contact Information: Diantha Fossa, daughter Anticipated Caregiver: daughter in law, Danita and other family members Anticipated Caregiver's Contact Information: see contacts Ability/Limitations of Caregiver: none Caregiver Availability: 24/7 Discharge Plan Discussed with Primary Caregiver: Yes Is Caregiver In Agreement with Plan?: Yes Does Caregiver/Family have Issues with Lodging/Transportation while Pt is in Rehab?: No  Goals Patient/Family Goal for Rehab: supervision with PT, OT and SLP Expected length of stay: ELOS 10 to 14 days Pt/Family Agrees to Admission and willing to participate: Yes Program Orientation Provided & Reviewed with Pt/Caregiver Including Roles  & Responsibilities: Yes  Decrease burden of Care through IP rehab admission: n/a  Possible need for SNF placement upon discharge:not anticipated  Patient Condition: This patient's condition remains as documented in the consult dated 07/05/23, in which the Rehabilitation Physician determined and documented that the patient's condition is appropriate for intensive rehabilitative care in an inpatient rehabilitation facility. Will admit to inpatient rehab today.  Preadmission Screen Completed By:  Tanya Fantasia, RN, MSN 07/05/2023 1:01 PM ______________________________________________________________________   Discussed status with Dr. Rachel Budds on 07/08/23 at  900 and received approval for admission today.  Admission Coordinator:  Tanya Fantasia RN MSN time 1214/Date 07/08/23

## 2023-07-06 DIAGNOSIS — D649 Anemia, unspecified: Secondary | ICD-10-CM | POA: Diagnosis not present

## 2023-07-06 DIAGNOSIS — I10 Essential (primary) hypertension: Secondary | ICD-10-CM | POA: Diagnosis not present

## 2023-07-06 DIAGNOSIS — N185 Chronic kidney disease, stage 5: Secondary | ICD-10-CM | POA: Diagnosis not present

## 2023-07-06 DIAGNOSIS — I5033 Acute on chronic diastolic (congestive) heart failure: Secondary | ICD-10-CM | POA: Diagnosis not present

## 2023-07-06 LAB — CBC
HCT: 26 % — ABNORMAL LOW (ref 36.0–46.0)
Hemoglobin: 8.3 g/dL — ABNORMAL LOW (ref 12.0–15.0)
MCH: 29.3 pg (ref 26.0–34.0)
MCHC: 31.9 g/dL (ref 30.0–36.0)
MCV: 91.9 fL (ref 80.0–100.0)
Platelets: 246 10*3/uL (ref 150–400)
RBC: 2.83 MIL/uL — ABNORMAL LOW (ref 3.87–5.11)
RDW: 17.1 % — ABNORMAL HIGH (ref 11.5–15.5)
WBC: 12.7 10*3/uL — ABNORMAL HIGH (ref 4.0–10.5)
nRBC: 0.2 % (ref 0.0–0.2)

## 2023-07-06 LAB — GLUCOSE, CAPILLARY
Glucose-Capillary: 96 mg/dL (ref 70–99)
Glucose-Capillary: 130 mg/dL — ABNORMAL HIGH (ref 70–99)
Glucose-Capillary: 213 mg/dL — ABNORMAL HIGH (ref 70–99)

## 2023-07-06 MED ORDER — VANCOMYCIN VARIABLE DOSE PER UNSTABLE RENAL FUNCTION (PHARMACIST DOSING): Status: DC

## 2023-07-06 MED ORDER — VANCOMYCIN HCL 750 MG/150ML IV SOLN
750.0000 mg | Freq: Once | INTRAVENOUS | Status: AC
  Administered 2023-07-06: 750 mg via INTRAVENOUS
  Filled 2023-07-06: qty 150

## 2023-07-06 MED ORDER — PIPERACILLIN-TAZOBACTAM IN DEX 2-0.25 GM/50ML IV SOLN
2.2500 g | Freq: Three times a day (TID) | INTRAVENOUS | Status: DC
  Administered 2023-07-06 – 2023-07-08 (×6): 2.25 g via INTRAVENOUS
  Filled 2023-07-06 (×10): qty 50

## 2023-07-06 NOTE — Progress Notes (Signed)
 Patient ID: [name], female   DOB: [birth year], 78 y.o.   MRN: [id number] S: No complaints O:BP (!) 160/86 (BP Location: Right Arm)   Pulse 79   Temp 99.2 F (37.3 C) (Oral)   Resp 18   Ht (P) 5\' 4"  (1.626 m)   Wt 71.7 kg   SpO2 99%   BMI (P) 27.13 kg/m   Intake/Output Summary (Last 24 hours) at 07/06/2023 1105 Last data filed at 07/06/2023 0100 Gross per 24 hour  Intake 480 ml  Output 3000 ml  Net -2520 ml   Intake/Output: I/O last 3 completed shifts: In: 814.1 [P.O.:640; I.V.:174.1] Out: 3000 [Other:3000]  Intake/Output this shift:  No intake/output data recorded. Weight change:  Gen: NAD CVS: RRR Resp: CTA Abd: +BS, soft, NT/ND Ext: no edema  Recent Labs  Lab 06/30/23 0920 07/01/23 0824 07/01/23 2200 07/03/23 0507 07/05/23 0512  NA 134* 136 134* 135 135  K 5.1 5.3* 5.4* 4.4 3.8  CL 99 99 101 101 96*  CO2 24 24 22  21* 22  GLUCOSE 140* 87 162* 119* 98  BUN 64* 83* 85* 68* 58*  CREATININE 4.28* 4.99* 5.37* 4.81* 5.03*  ALBUMIN   --   --  2.1* 2.0* 2.0*  CALCIUM  8.7* 8.9 8.7* 8.5* 9.1  PHOS  --   --  5.1* 5.1* 4.8*   Liver Function Tests: Recent Labs  Lab 07/01/23 2200 07/03/23 0507 07/05/23 0512  ALBUMIN  2.1* 2.0* 2.0*   No results for input(s): "LIPASE", "AMYLASE" in the last 168 hours. No results for input(s): "AMMONIA" in the last 168 hours. CBC: Recent Labs  Lab 07/01/23 2200 07/02/23 0339 07/03/23 0757 07/03/23 1944 07/04/23 0448 07/05/23 0512 07/06/23 0554  WBC 10.2 14.6* 12.1*  --  11.9* 11.7* 12.7*  NEUTROABS 8.3*  --   --   --   --   --   --   HGB 6.9* 7.9* 6.6*   < > 7.9* 7.7* 8.3*  HCT 22.7* 24.8* 21.6*   < > 24.6* 24.7* 26.0*  MCV 95.4 92.2 92.7  --  90.4 91.8 91.9  PLT 231 214 226  --  231 252 246   < > = values in this interval not displayed.   Cardiac Enzymes: No results for input(s): "CKTOTAL", "CKMB", "CKMBINDEX", "TROPONINI" in the last 168 hours. CBG: Recent Labs  Lab 07/04/23 2032 07/05/23 1307  07/05/23 1651 07/05/23 2219 07/06/23 0814  GLUCAP 191* 127* 190* 125* 96    Iron  Studies: No results for input(s): "IRON ", "TIBC", "TRANSFERRIN", "FERRITIN" in the last 72 hours. Studies/Results: No results found.  sodium chloride    Intravenous Once   apixaban   5 mg Oral BID   Chlorhexidine  Gluconate Cloth  6 each Topical Q0600   colestipol   1 g Oral TID   darbepoetin (ARANESP ) injection - DIALYSIS  150 mcg Subcutaneous Q Sun-1800   FLUoxetine   20 mg Oral Daily   folic acid   1 mg Oral Daily   gabapentin   300 mg Oral QHS   insulin  aspart  0-6 Units Subcutaneous TID WC   lipase/protease/amylase  36,000 Units Oral TID AC   mirtazapine   7.5 mg Oral QHS   multivitamin  1 tablet Oral QHS   pantoprazole   40 mg Oral BID   rivastigmine   1.5 mg Oral BID   sevelamer  carbonate  800 mg Oral TID WC   torsemide   100 mg Oral Q T,Th,S,Su    BMET    Component Value Date/Time   NA 135 07/05/2023 0512  NA 144 07/19/2020 1458   NA 140 03/14/2017 0810   K 3.8 07/05/2023 0512   K 3.8 03/14/2017 0810   CL 96 (L) 07/05/2023 0512   CO2 22 07/05/2023 0512   CO2 27 03/14/2017 0810   GLUCOSE 98 07/05/2023 0512   GLUCOSE 177 (H) 03/14/2017 0810   BUN 58 (H) 07/05/2023 0512   BUN 85 (HH) 07/19/2020 1458   BUN 35.6 (H) 03/14/2017 0810   CREATININE 5.03 (H) 07/05/2023 0512   CREATININE 2.31 (H) 10/25/2022 1355   CREATININE 1.8 (H) 03/14/2017 0810   CALCIUM  9.1 07/05/2023 0512   CALCIUM  8.3 (L) 06/18/2023 0334   CALCIUM  9.6 03/14/2017 0810   GFRNONAA 8 (L) 07/05/2023 0512   GFRNONAA 22 (L) 10/25/2022 1355   GFRNONAA 45 (L) 12/17/2013 1158   GFRAA 21 (L) 12/21/2019 0845   GFRAA 52 (L) 12/17/2013 1158   CBC    Component Value Date/Time   WBC 12.7 (H) 07/06/2023 0554   RBC 2.83 (L) 07/06/2023 0554   HGB 8.3 (L) 07/06/2023 0554   HGB 9.4 (L) 04/18/2023 1434   HGB 9.3 (L) 03/14/2017 0810   HCT 26.0 (L) 07/06/2023 0554   HCT 26.4 (L) 07/21/2019 0809   HCT 29.2 (L) 03/14/2017 0810   PLT  246 07/06/2023 0554   PLT 235 04/18/2023 1434   PLT 292 03/14/2017 0810   PLT 351 10/11/2016 1139   MCV 91.9 07/06/2023 0554   MCV 81.3 03/14/2017 0810   MCH 29.3 07/06/2023 0554   MCHC 31.9 07/06/2023 0554   RDW 17.1 (H) 07/06/2023 0554   RDW 14.0 03/14/2017 0810   LYMPHSABS 1.0 07/01/2023 2200   LYMPHSABS 1.4 03/14/2017 0810   MONOABS 0.6 07/01/2023 2200   MONOABS 0.4 03/14/2017 0810   EOSABS 0.1 07/01/2023 2200   EOSABS 0.1 03/14/2017 0810   BASOSABS 0.0 07/01/2023 2200   BASOSABS 0.0 03/14/2017 0810      HPI:  Pt is a 78 y.o.  with  hypertension, renal vein thrombosis on Eliquis , stroke, type II DM, lupus, CKD 4-5 who was recently admitted for CHF exacerbation and progressive CKD presented back with worsening dyspnea on exertion and fluid overload.      Assessment/Plan:   # Progressive CKD 5 versus AKI on CKD,: With frequent hospitalization for fluid overload.  Responding to IV diuretics but not po and with worsening renal function d/w patient and family (Daughter on phone 4/2) at length recommend starting dialysis. S/p LUE AVG + TDC placement 4/4 with HD 1, HD 2 4/5,  - HD 06/24/23, 4/9, 4/11, 4/14 -  on MWF schedule and plan for HD tomorrow. - CLIP complete-  has been [facility] at [facility] -  currently on MWF schedule here but may need to change to TTS for CIR.  Hopeful discharge soon.   # CKD-MBD/hyperphosphatemia: Started low dose sevelamer  in the recent visit, monitor lab.  PTH 220.  Phos is great now   #Anemia of CKD: Concern about GI bleed, underwent endoscopy t recently with no sign of bleeding.  Back on Eliquis .  Hb has dropped again and is due for blood transfusion.  Given IV iron  and ESA   # Hypertension/volume: [name] improved after diuresis.  Torsemide  on non-dialysis days   #A fib: on eliquis    # fever:  - cultures drawn, follow - vanc with HD and ceftriaxone  --  - developed recurrent fever and is now on Zosyn    # Vascular access - developed steal syndrome  of left  hand. - VVS following s/p excision of AVG 07/01/23 due to concern for steal syndrome with improvement of symptoms and ok to resume anticoagulation.   #Disposition - awaiting possible CIR placement due to deconditioning.    Benjamin Brands, MD [facility]

## 2023-07-06 NOTE — Progress Notes (Addendum)
 Progress Note   Patient: [name] GNF:[id number] DOB: 31-Dec-1945 DOA: 06/15/2023     20 DOS: the patient was seen and examined on 07/06/2023   Brief hospital course: Mrs. Janowski was admitted to the hospital with the working diagnosis of heart failure exacerbation in the setting of progressive renal disease.   78 yo female with the past medical history of diastolic heart failure, CKD 5, history of DVT, renal vein thrombosis, depression, type 2 diabetes mellitus, history of breast cancer, and systemic erythematous lupus who presented with dyspnea.  Recent hospitalization for volume overload due to heart failure 03/08 to 03/15, she was diuresed and discharged home.  Unfortunately at home she had progressive dyspnea on exertion, bilateral lower extremity edema and rapid weight gain (20 lbs since her discharge from the hospital).  On her initial physical examination her blood pressure was 160/70, HR 94 RR 17 and 02 saturation 97%. Lungs with no wheezing or rhonchi, heart with S1 and S2 present and regular with no gallops, rubs or murmurs, respiratory with no wheezing or rhonchi, abdomen with no distention and positive pitting bilateral lower extremity edema   Na 140, K 5.0 CL 109, bicarbonate 20, glucose 147 bun 82 cr 4,0  BNP 560  High sensitive troponin 125 and 117  Wbc 11,4 hgb 7,0 plt 192   Chest radiograph with hypoinflation, with mild cardiomegaly, bilateral hilar vascular congestion with cephalization of the vasculature and small bilateral pleural effusions.   EKG 88 bpm, normal axis, right bundle branch block, sinus rhythm with no significant ST segment or T wave changes.   Patient was placed on IV furosemide  for diuresis, and then transitioned to oral loop diuretics.  Her renal function continue to worsened and decision was made to start patient on renal replacement therapy.   04/04 patient had right internal jugular tunneled HD catheter placed and left upper extremity fistula  placed.  04/05 HD with good toleration.  04/06 improved volume status.   04/07 discharge has held due to low diastolic pressure. She developed signs of cellulitis on her left upper extremity fistula site.  04/08 started on antibiotic therapy.  4/9-10 onwards, persistent severe left arm pain 4/12 vascular surgery consulted, plan for AV graft excision 4/14 s/p excision of AV graft  4/15 new leukocytosis, body aches, temp 99.0 fever workup initiated, started empirically on Vanco and Zosyn  04/16 hgb 6,6 ordered one unit PRBC transfusion, HD today.  04/17 discontinue antibiotic therapy 04/18 changed from IV heparin  to apixaban .  04/19 patient with recurrent fever and now worsening leukocytosis, resumed antibiotic therapy with IV vancomycin  and Zosyn , check left upper extremity MRI.    Assessment and Plan: * Acute on chronic diastolic CHF (congestive heart failure) (HCC) Echocardiogram with preserved LV systolic function with EF 60 to 65%, moderate LVH, RV systolic function preserved, mild mitral valve regurgitation, mid tricuspid valve regurgitation, RVSP 33,9 mmHg, trivial pericardial effusion,    Volume status has improved.  Continue ultrafiltration for volume management.   Limited medical therapy due to risk of hypotension.   Chronic kidney disease (CKD), stage V (HCC) Progressed to ESRD on renal replacement therapy per HD  Has a left upper extremity fistula has been removed  Has right internal jugular tunneled HD cathter in place.  Torsemide  on non HD days, 100 mg   Anemia of chronic renal disease.  SP EPO and IV iron   Hgb 8.3   Metabolic bone disease, continue with sevelamer .   History of renal vein thrombosis in 2019  and deep vein thrombosis 2022  Anticoagulation with apixaban .   Left upper extremity with clinical signs of acute cellulitis (in the setting of new AVF fistula creation). Fistula has been removed 04/13 OP note from fistula excision with no signs of local  infection.  Stopped antibiotics 04/17.  Patient with recurrent fever and leukocytosis, will check blood cultures and left upper extremity MRI.  Resume antibiotic therapy with Vancomycin  and Zosyn .  Follow up cell count, cultures and temperature curve.    Essential hypertension Continue fluid management per HD   Acute on chronic anemia Acute on chronic anemia of chronic renal disease.  03./30 PRBC transfusion x 1 04/16 PRBC transfusion x1   GERD (gastroesophageal reflux disease) Continue antiacid therapy with pantoprazole .   Type 2 diabetes mellitus (HCC) Patient is on insulin  sliding scale for glucose cover and monitoring.   Gout flare Patient was treated with 3 days of prednisone  with good toleration   CAD (coronary artery disease) SP PCI in 2000 No chest pain, no acute coronary syndrome   Depression Continue with rivastigmine , mirtazapine  and fluoxetine    Breast cancer of lower-inner quadrant of right female breast (HCC) Follow up as outpatient.    Subjective: Patient is feeling better, she reports improving in left upper extremity pain and mobility. Positive bowel incontinence   Physical Exam: Vitals:   07/06/23 0047 07/06/23 0400 07/06/23 0604 07/06/23 0802  BP: (!) 137/57  (!) 162/66 (!) 160/86  Pulse: 79  79 79  Resp: 15  18   Temp: (!) 100.7 F (38.2 C)  99 F (37.2 C) 99.2 F (37.3 C)  TempSrc: Oral  Oral Oral  SpO2: 98%  96% 99%  Weight:  71.7 kg    Height:       Neurology awake and alert ENT with mild pallor with no icterus Cardiovascular with S1 and S2 present and regular with no gallops, rubs or murmurs Respiratory with no rales or wheezing Abdomen with no distention  No lower extremity edema.  Left upper extremity with no edema or erythema,  Data Reviewed:    Family Communication: I spoke with patient's daughter over the phone, we talked in detail about patient's condition, plan of care and prognosis and all questions were  addressed.   Disposition: Status is: Inpatient Remains inpatient appropriate because: IV antibiotics   Planned Discharge Destination: Rehab   Author: Albertus Alt, MD 07/06/2023 11:11 AM  For on call review [email or web address].

## 2023-07-06 NOTE — Progress Notes (Signed)
 PHARMACY ANTIBIOTIC CONSULT NOTE   [name] a 78 y.o. female presented w/ HF exacerbation. Patient briefly received a couple of days of IV abx for LUE cellulitis in setting of new AVF creation- abx were stopped 4/17. Pharmacy has been consulted for Vancomycin /Zosyn  dosing.  Last dose of vancomycin  750 mg 4/16 1535- S/p HD. Patient received one other HD session since that time on 4/18, duration 3.5 hours, BFR 400. Patient does have some residual UOP and receiving torsemide  on non-HD days. Anticipate current level is ~15, or a little lower given residual UOP. Will provide standard post-HD supplemental dose with level recheck 4 hours after dose.   Will not enter scheduled doses given there is possibility to change MWF HD schedule to TTS per nephron note  ESRD on HD MWF, WBC 12.7  Vital Signs: Tm 101.1, HR WNL-elevated, BP elevated  Estimated Creatinine Clearance: 9.2 mL/min (A) (by C-G formula based on SCr of 5.03 mg/dL (H)).  Plan: START Zosyn  2.25 g IV Q8H  Vancomycin  750 mg IV x1 - level; recheck 4 hours after dose is infused  Vancomycin  variable dosing per unstable renal fxn    Allergies:  Allergies  Allergen Reactions   Anesthetics, Halogenated Other (See Comments)    Per patient's daughter, she can not tolerate anesthetics.    Etomidate  Other (See Comments)    Heart stopped during anesthesia   Fentanyl  Other (See Comments)    Heart stopped during anesthesia    Percocet [Oxycodone -Acetaminophen ] Other (See Comments)    Headaches    Diazepam Other (See Comments)    Agitation   Lorazepam  Nausea Only   Tramadol  Hcl Nausea And Vomiting   Haloperidol Nausea And Vomiting    **Haldol**   Morphine  Sulfate Nausea Only    agitation   Propoxyphene Nausea And Vomiting    **Darvocet** no longer available   Versed  [Midazolam ] Other (See Comments)    Heart stopped during anesthesia    Filed Weights   07/05/23 0826 07/05/23 1228 07/06/23 0400  Weight: 74.9 kg (165 lb 2 oz)  71.9 kg (158 lb 8.2 oz) 71.7 kg (158 lb 1.1 oz)       Latest Ref Rng & Units 07/06/2023    5:54 AM 07/05/2023    5:12 AM 07/04/2023    4:48 AM  CBC  WBC 4.0 - 10.5 K/uL 12.7  11.7  11.9   Hemoglobin 12.0 - 15.0 g/dL 8.3  7.7  7.9   Hematocrit 36.0 - 46.0 % 26.0  24.7  24.6   Platelets 150 - 400 K/uL 246  252  231     Antibiotics Given (last 72 hours)     Date/Time Action Medication Dose Rate   07/03/23 1535 New Bag/Given   vancomycin  (VANCOREADY) IVPB 750 mg/150 mL 750 mg 150 mL/hr   07/03/23 1759 New Bag/Given   piperacillin -tazobactam (ZOSYN ) IVPB 2.25 g 2.25 g 100 mL/hr   07/03/23 2342 New Bag/Given   piperacillin -tazobactam (ZOSYN ) IVPB 2.25 g 2.25 g 100 mL/hr   07/04/23 0551 New Bag/Given   piperacillin -tazobactam (ZOSYN ) IVPB 2.25 g 2.25 g 100 mL/hr   07/04/23 1253 New Bag/Given   piperacillin -tazobactam (ZOSYN ) IVPB 2.25 g 2.25 g 100 mL/hr      Vanc 4/15 >>4/17; 4/19>> Zosyn  4/19>> Zosyn  4/15 >>4/17  4/8 - Blood - negF 4/15 - Bld - NGTD 4/15 MRSA PCR - negative 4/19 Bcx- sent   Thank you for allowing pharmacy to be a part of this patient's care.  Chrystie Crass, PharmD  Clinical Pharmacist  07/06/2023 11:30 AM

## 2023-07-07 ENCOUNTER — Inpatient Hospital Stay (HOSPITAL_COMMUNITY)

## 2023-07-07 DIAGNOSIS — N185 Chronic kidney disease, stage 5: Secondary | ICD-10-CM | POA: Diagnosis not present

## 2023-07-07 DIAGNOSIS — D649 Anemia, unspecified: Secondary | ICD-10-CM | POA: Diagnosis not present

## 2023-07-07 DIAGNOSIS — I5033 Acute on chronic diastolic (congestive) heart failure: Secondary | ICD-10-CM | POA: Diagnosis not present

## 2023-07-07 DIAGNOSIS — I10 Essential (primary) hypertension: Secondary | ICD-10-CM | POA: Diagnosis not present

## 2023-07-07 LAB — CULTURE, BLOOD (ROUTINE X 2)
Culture: NO GROWTH
Culture: NO GROWTH
Special Requests: ADEQUATE
Special Requests: ADEQUATE

## 2023-07-07 LAB — RENAL FUNCTION PANEL
Albumin: 2.1 g/dL — ABNORMAL LOW (ref 3.5–5.0)
Anion gap: 19 — ABNORMAL HIGH (ref 5–15)
BUN: 58 mg/dL — ABNORMAL HIGH (ref 8–23)
CO2: 19 mmol/L — ABNORMAL LOW (ref 22–32)
Calcium: 9.4 mg/dL (ref 8.9–10.3)
Chloride: 97 mmol/L — ABNORMAL LOW (ref 98–111)
Creatinine, Ser: 5.56 mg/dL — ABNORMAL HIGH (ref 0.44–1.00)
GFR, Estimated: 7 mL/min — ABNORMAL LOW (ref >60)
Glucose, Bld: 110 mg/dL — ABNORMAL HIGH (ref 70–99)
Phosphorus: 7.1 mg/dL — ABNORMAL HIGH (ref 2.5–4.6)
Potassium: 4.6 mmol/L (ref 3.5–5.1)
Sodium: 135 mmol/L (ref 135–145)

## 2023-07-07 LAB — CBC WITH DIFFERENTIAL/PLATELET
Abs Immature Granulocytes: 0.15 10*3/uL — ABNORMAL HIGH (ref 0.00–0.07)
Basophils Absolute: 0 10*3/uL (ref 0.0–0.1)
Basophils Relative: 0 %
Eosinophils Absolute: 0 10*3/uL (ref 0.0–0.5)
Eosinophils Relative: 0 %
HCT: 28 % — ABNORMAL LOW (ref 36.0–46.0)
Hemoglobin: 8.5 g/dL — ABNORMAL LOW (ref 12.0–15.0)
Immature Granulocytes: 1 %
Lymphocytes Relative: 13 %
Lymphs Abs: 1.4 10*3/uL (ref 0.7–4.0)
MCH: 28.5 pg (ref 26.0–34.0)
MCHC: 30.4 g/dL (ref 30.0–36.0)
MCV: 94 fL (ref 80.0–100.0)
Monocytes Absolute: 0.8 10*3/uL (ref 0.1–1.0)
Monocytes Relative: 7 %
Neutro Abs: 8.8 10*3/uL — ABNORMAL HIGH (ref 1.7–7.7)
Neutrophils Relative %: 79 %
Platelets: 276 10*3/uL (ref 150–400)
RBC: 2.98 MIL/uL — ABNORMAL LOW (ref 3.87–5.11)
RDW: 17 % — ABNORMAL HIGH (ref 11.5–15.5)
WBC: 11.2 10*3/uL — ABNORMAL HIGH (ref 4.0–10.5)
nRBC: 0.2 % (ref 0.0–0.2)

## 2023-07-07 LAB — GLUCOSE, CAPILLARY
Glucose-Capillary: 126 mg/dL — ABNORMAL HIGH (ref 70–99)
Glucose-Capillary: 148 mg/dL — ABNORMAL HIGH (ref 70–99)
Glucose-Capillary: 153 mg/dL — ABNORMAL HIGH (ref 70–99)
Glucose-Capillary: 151 mg/dL — ABNORMAL HIGH (ref 70–99)

## 2023-07-07 LAB — VANCOMYCIN, RANDOM: Vancomycin Rm: 29 ug/mL

## 2023-07-07 MED ORDER — ORAL CARE MOUTH RINSE: 15.0000 mL | OROMUCOSAL | Status: DC | PRN

## 2023-07-07 NOTE — Progress Notes (Signed)
 Patient ID: [name], female   DOB: 04-03-1945, 78 y.o.   MRN: [id number] S: No complaints. O:BP (!) 124/53 (BP Location: Right Leg)   Pulse 69   Temp 98 F (36.7 C) (Oral)   Resp 18   Ht (P) 5\' 4"  (1.626 m)   Wt 71.7 kg   SpO2 100%   BMI (P) 27.13 kg/m   Intake/Output Summary (Last 24 hours) at 07/07/2023 1040 Last data filed at 07/07/2023 0600 Gross per 24 hour  Intake 358.83 ml  Output 0 ml  Net 358.83 ml   Intake/Output: I/O last 3 completed shifts: In: 598.8 [P.O.:480; IV Piggyback:118.8] Out: 0   Intake/Output this shift:  No intake/output data recorded. Weight change:  Gen: NAD CVS: RRR Resp:CTA Abd: +BS, soft, NT/ND Ext: no edema  Recent Labs  Lab 07/01/23 0824 07/01/23 2200 07/03/23 0507 07/05/23 0512 07/07/23 0759  NA 136 134* 135 135 135  K 5.3* 5.4* 4.4 3.8 4.6  CL 99 101 101 96* 97*  CO2 24 22 21* 22 19*  GLUCOSE 87 162* 119* 98 110*  BUN 83* 85* 68* 58* 58*  CREATININE 4.99* 5.37* 4.81* 5.03* 5.56*  ALBUMIN   --  2.1* 2.0* 2.0* 2.1*  CALCIUM  8.9 8.7* 8.5* 9.1 9.4  PHOS  --  5.1* 5.1* 4.8* 7.1*   Liver Function Tests: Recent Labs  Lab 07/03/23 0507 07/05/23 0512 07/07/23 0759  ALBUMIN  2.0* 2.0* 2.1*   No results for input(s): "LIPASE", "AMYLASE" in the last 168 hours. No results for input(s): "AMMONIA" in the last 168 hours. CBC: Recent Labs  Lab 07/01/23 2200 07/02/23 0339 07/03/23 0757 07/03/23 1944 07/04/23 0448 07/05/23 0512 07/06/23 0554 07/07/23 0759  WBC 10.2   < > 12.1*  --  11.9* 11.7* 12.7* 11.2*  NEUTROABS 8.3*  --   --   --   --   --   --  8.8*  HGB 6.9*   < > 6.6*   < > 7.9* 7.7* 8.3* 8.5*  HCT 22.7*   < > 21.6*   < > 24.6* 24.7* 26.0* 28.0*  MCV 95.4   < > 92.7  --  90.4 91.8 91.9 94.0  PLT 231   < > 226  --  231 252 246 276   < > = values in this interval not displayed.   Cardiac Enzymes: No results for input(s): "CKTOTAL", "CKMB", "CKMBINDEX", "TROPONINI" in the last 168 hours. CBG: Recent Labs  Lab  07/05/23 2219 07/06/23 0814 07/06/23 1210 07/06/23 1744 07/07/23 0744  GLUCAP 125* 96 130* 213* 126*    Iron  Studies: No results for input(s): "IRON ", "TIBC", "TRANSFERRIN", "FERRITIN" in the last 72 hours. Studies/Results: No results found.  sodium chloride    Intravenous Once   apixaban   5 mg Oral BID   Chlorhexidine  Gluconate Cloth  6 each Topical Q0600   colestipol   1 g Oral TID   darbepoetin (ARANESP ) injection - DIALYSIS  150 mcg Subcutaneous Q Sun-1800   FLUoxetine   20 mg Oral Daily   folic acid   1 mg Oral Daily   gabapentin   300 mg Oral QHS   insulin  aspart  0-6 Units Subcutaneous TID WC   lipase/protease/amylase  36,000 Units Oral TID AC   mirtazapine   7.5 mg Oral QHS   multivitamin  1 tablet Oral QHS   pantoprazole   40 mg Oral BID   rivastigmine   1.5 mg Oral BID   sevelamer  carbonate  800 mg Oral TID WC   torsemide   100 mg  Oral Q T,Th,S,Su   vancomycin  variable dose per unstable renal function (pharmacist dosing)   Does not apply See admin instructions    BMET    Component Value Date/Time   NA 135 07/07/2023 0759   NA 144 07/19/2020 1458   NA 140 03/14/2017 0810   K 4.6 07/07/2023 0759   K 3.8 03/14/2017 0810   CL 97 (L) 07/07/2023 0759   CO2 19 (L) 07/07/2023 0759   CO2 27 03/14/2017 0810   GLUCOSE 110 (H) 07/07/2023 0759   GLUCOSE 177 (H) 03/14/2017 0810   BUN 58 (H) 07/07/2023 0759   BUN 85 (HH) 07/19/2020 1458   BUN 35.6 (H) 03/14/2017 0810   CREATININE 5.56 (H) 07/07/2023 0759   CREATININE 2.31 (H) 10/25/2022 1355   CREATININE 1.8 (H) 03/14/2017 0810   CALCIUM  9.4 07/07/2023 0759   CALCIUM  8.3 (L) 06/18/2023 0334   CALCIUM  9.6 03/14/2017 0810   GFRNONAA 7 (L) 07/07/2023 0759   GFRNONAA 22 (L) 10/25/2022 1355   GFRNONAA 45 (L) 12/17/2013 1158   GFRAA 21 (L) 12/21/2019 0845   GFRAA 52 (L) 12/17/2013 1158   CBC    Component Value Date/Time   WBC 11.2 (H) 07/07/2023 0759   RBC 2.98 (L) 07/07/2023 0759   HGB 8.5 (L) 07/07/2023 0759   HGB 9.4  (L) 04/18/2023 1434   HGB 9.3 (L) 03/14/2017 0810   HCT 28.0 (L) 07/07/2023 0759   HCT 26.4 (L) 07/21/2019 0809   HCT 29.2 (L) 03/14/2017 0810   PLT 276 07/07/2023 0759   PLT 235 04/18/2023 1434   PLT 292 03/14/2017 0810   PLT 351 10/11/2016 1139   MCV 94.0 07/07/2023 0759   MCV 81.3 03/14/2017 0810   MCH 28.5 07/07/2023 0759   MCHC 30.4 07/07/2023 0759   RDW 17.0 (H) 07/07/2023 0759   RDW 14.0 03/14/2017 0810   LYMPHSABS 1.4 07/07/2023 0759   LYMPHSABS 1.4 03/14/2017 0810   MONOABS 0.8 07/07/2023 0759   MONOABS 0.4 03/14/2017 0810   EOSABS 0.0 07/07/2023 0759   EOSABS 0.1 03/14/2017 0810   BASOSABS 0.0 07/07/2023 0759   BASOSABS 0.0 03/14/2017 0810    HPI:  Pt is a 78 y.o.  with  hypertension, renal vein thrombosis on Eliquis , stroke, type II DM, lupus, CKD 4-5 who was recently admitted for CHF exacerbation and progressive CKD presented back with worsening dyspnea on exertion and fluid overload.      Assessment/Plan:   # Progressive CKD 5 versus AKI on CKD,: With frequent hospitalization for fluid overload.  Responding to IV diuretics but not po and with worsening renal function d/w patient and family (Daughter on phone 4/2) at length recommend starting dialysis. S/p LUE AVG + TDC placement 4/4 with HD 1, HD 2 4/5,  - HD 06/24/23, 4/9, 4/11, 4/14 -  on MWF schedule and plan for HD tomorrow. - CLIP complete-  has been accepted at [facility] -  currently on MWF schedule here but may need to change to TTS for CIR.  Hopeful discharge soon.   # CKD-MBD/hyperphosphatemia: Started low dose sevelamer  in the recent visit, monitor lab.  PTH 220.  Phos is great now   #Anemia of CKD: Concern about GI bleed, underwent endoscopy t recently with no sign of bleeding.  Back on Eliquis .  Hb has dropped again and is due for blood transfusion.  Given IV iron  and ESA   # Hypertension/volume: [name] improved after diuresis.  Torsemide  on non-dialysis days   #A fib:  on eliquis    # fever:  -  cultures drawn, follow - vanc with HD and ceftriaxone  --  - developed recurrent fever and is now on Zosyn    # Vascular access - developed steal syndrome of left hand. - VVS following s/p excision of AVG 07/01/23 due to concern for steal syndrome with improvement of symptoms and ok to resume anticoagulation.   #Disposition - awaiting possible CIR placement due to deconditioning.  Benjamin Brands, MD [employer] [phone]

## 2023-07-07 NOTE — Progress Notes (Signed)
 Progress Note   Patient: [name] GMW:[id number] DOB: [birth year] DOA: 06/15/2023     21 DOS: the patient was seen and examined on 07/07/2023   Brief hospital course: Mrs. Bonanno was admitted to the hospital with the working diagnosis of heart failure exacerbation in the setting of progressive renal disease.   78 yo female with the past medical history of diastolic heart failure, CKD 5, history of DVT, renal vein thrombosis, depression, type 2 diabetes mellitus, history of breast cancer, and systemic erythematous lupus who presented with dyspnea.  Recent hospitalization for volume overload due to heart failure 03/08 to 03/15, she was diuresed and discharged home.  Unfortunately at home she had progressive dyspnea on exertion, bilateral lower extremity edema and rapid weight gain (20 lbs since her discharge from the hospital).  On her initial physical examination her blood pressure was 160/70, HR 94 RR 17 and 02 saturation 97%. Lungs with no wheezing or rhonchi, heart with S1 and S2 present and regular with no gallops, rubs or murmurs, respiratory with no wheezing or rhonchi, abdomen with no distention and positive pitting bilateral lower extremity edema   Na 140, K 5.0 CL 109, bicarbonate 20, glucose 147 bun 82 cr 4,0  BNP 560  High sensitive troponin 125 and 117  Wbc 11,4 hgb 7,0 plt 192   Chest radiograph with hypoinflation, with mild cardiomegaly, bilateral hilar vascular congestion with cephalization of the vasculature and small bilateral pleural effusions.   EKG 88 bpm, normal axis, right bundle branch block, sinus rhythm with no significant ST segment or T wave changes.   Patient was placed on IV furosemide  for diuresis, and then transitioned to oral loop diuretics.  Her renal function continue to worsened and decision was made to start patient on renal replacement therapy.   04/04 patient had right internal jugular tunneled HD catheter placed and left upper extremity fistula  placed.  04/05 HD with good toleration.  04/06 improved volume status.   04/07 discharge has held due to low diastolic pressure. She developed signs of cellulitis on her left upper extremity fistula site.  04/08 started on antibiotic therapy.  4/9-10 onwards, persistent severe left arm pain 4/12 vascular surgery consulted, plan for AV graft excision 4/14 s/p excision of AV graft  4/15 new leukocytosis, body aches, temp 99.0 fever workup initiated, started empirically on Vanco and Zosyn  04/16 hgb 6,6 ordered one unit PRBC transfusion, HD today.  04/17 discontinue antibiotic therapy 04/18 changed from IV heparin  to apixaban .  04/19 patient with recurrent fever and now worsening leukocytosis, resumed antibiotic therapy with IV vancomycin  and Zosyn , check left upper extremity MRI.   04/20 left upper extremity MRI with local inflammation possible small developing abscess.   Assessment and Plan: * Acute on chronic diastolic CHF (congestive heart failure) (HCC) Echocardiogram with preserved LV systolic function with EF 60 to 65%, moderate LVH, RV systolic function preserved, mild mitral valve regurgitation, mid tricuspid valve regurgitation, RVSP 33,9 mmHg, trivial pericardial effusion,    Volume status has improved.  Continue ultrafiltration for volume management.   Limited medical therapy due to risk of hypotension.   Chronic kidney disease (CKD), stage V (HCC) Progressed to ESRD on renal replacement therapy per HD  Has a left upper extremity fistula has been removed  Has right internal jugular tunneled HD cathter in place.  Torsemide  on non HD days, 100 mg   Anemia of chronic renal disease.  SP EPO and IV iron   Hgb 8.3   Metabolic bone  disease, continue with sevelamer .   History of renal vein thrombosis in 2019 and deep vein thrombosis 2022  Anticoagulation with apixaban .   Left upper extremity with clinical signs of acute cellulitis (in the setting of new AVF fistula  creation). Fistula has been removed 04/13 OP note from fistula excision with no signs of local infection.  Stopped antibiotics 04/17.  Resumed antibiotics 04/19  Improvement in fever and leukocytosis.  Left upper extremity MRI, noted extending laterally radially, from site of removed fistula region of inflammatory phlegmon, small curvilinear region of fluid measuring up to 1,8, 0,5 and 1,5 cm just anterior to the muscle at the biceps musculotendinous junction. May represent small developing abscess.   Continue with antibiotic therapy with Vancomycin  and Zosyn .  Will notify vascular surgery  Follow up cell count, cultures and temperature curve.    Essential hypertension Continue fluid management per HD   Acute on chronic anemia Acute on chronic anemia of chronic renal disease.  03./30 PRBC transfusion x 1 04/16 PRBC transfusion x1   GERD (gastroesophageal reflux disease) Continue antiacid therapy with pantoprazole .   Type 2 diabetes mellitus (HCC) Patient is on insulin  sliding scale for glucose cover and monitoring.   Gout flare Patient was treated with 3 days of prednisone  with good toleration   CAD (coronary artery disease) SP PCI in 2000 No chest pain, no acute coronary syndrome   Depression Continue with rivastigmine , mirtazapine  and fluoxetine    Breast cancer of lower-inner quadrant of right female breast (HCC) Follow up as outpatient.        Subjective: Patient with improvement in left arm pain, now she has back pain and difficulty ambulating, lower extremity weakness.   Physical Exam: Vitals:   07/06/23 2052 07/06/23 2312 07/07/23 0544 07/07/23 0744  BP: (!) 160/70  (!) 173/76 (!) 124/53  Pulse: 85  70 69  Resp: 17  18   Temp: (!) 101 F (38.3 C) 99.6 F (37.6 C) 97.6 F (36.4 C) 98 F (36.7 C)  TempSrc: Oral Oral Oral Oral  SpO2: 100%  100% 100%  Weight:      Height:       Neurology awake and alert. Lower extremity movement limited due to pain   ENT with mild pallor Cardiovascular with S1 and S2 present and regular Respiratory with no wheezing or rhonchi  Abdomen with no distention  Data Reviewed:    Family Communication: I spoke with patient's daughter at the bedside, we talked in detail about patient's condition, plan of care and prognosis and all questions were addressed.   Disposition: Status is: Inpatient Remains inpatient appropriate because: IV antibiotics   Planned Discharge Destination: Rehab     Author: Albertus Alt, MD 07/07/2023 3:05 PM  For on call review [email or web address].

## 2023-07-07 NOTE — Progress Notes (Signed)
 PHARMACY ANTIBIOTIC CONSULT NOTE   [name] a 78 y.o. female presented w/ HF exacerbation. Patient briefly received a couple of days of IV abx for LUE cellulitis in setting of new AVF creation- abx were stopped 4/17. Pharmacy has been consulted for Vancomycin /Zosyn  dosing.  Will not enter scheduled doses given there is possibility to change MWF HD schedule to TTS per nephro note.  ESRD on HD MWF, WBC 12.7>>11.2  Vital Signs: Tm 101, VSS  4/20 AM: Vancomycin  random level 29- slightly above goal of 15-25. Should be OK to resume normal dosing with HD.   Plan: START Zosyn  2.25 g IV Q8H  Vancomycin  variable dosing per unstable renal fxn  F./U MRI L elbow  Monitor HD schedule and tolerability.    Allergies:  Allergies  Allergen Reactions   Anesthetics, Halogenated Other (See Comments)    Per patient's daughter, she can not tolerate anesthetics.    Etomidate  Other (See Comments)    Heart stopped during anesthesia   Fentanyl  Other (See Comments)    Heart stopped during anesthesia    Percocet [Oxycodone -Acetaminophen ] Other (See Comments)    Headaches    Diazepam Other (See Comments)    Agitation   Lorazepam  Nausea Only   Tramadol  Hcl Nausea And Vomiting   Haloperidol Nausea And Vomiting    **Haldol**   Morphine  Sulfate Nausea Only    agitation   Propoxyphene Nausea And Vomiting    **Darvocet** no longer available   Versed  [Midazolam ] Other (See Comments)    Heart stopped during anesthesia    Filed Weights   07/05/23 0826 07/05/23 1228 07/06/23 0400  Weight: 74.9 kg (165 lb 2 oz) 71.9 kg (158 lb 8.2 oz) 71.7 kg (158 lb 1.1 oz)       Latest Ref Rng & Units 07/07/2023    7:59 AM 07/06/2023    5:54 AM 07/05/2023    5:12 AM  CBC  WBC 4.0 - 10.5 K/uL 11.2  12.7  11.7   Hemoglobin 12.0 - 15.0 g/dL 8.5  8.3  7.7   Hematocrit 36.0 - 46.0 % 28.0  26.0  24.7   Platelets 150 - 400 K/uL 276  246  252     Antibiotics Given (last 72 hours)     Date/Time Action  Medication Dose Rate   07/04/23 1253 New Bag/Given   piperacillin -tazobactam (ZOSYN ) IVPB 2.25 g 2.25 g 100 mL/hr   07/06/23 1435 New Bag/Given   vancomycin  (VANCOREADY) IVPB 750 mg/150 mL 750 mg 150 mL/hr   07/06/23 1827 New Bag/Given  [medication not available]   piperacillin -tazobactam (ZOSYN ) IVPB 2.25 g 2.25 g 100 mL/hr   07/06/23 2205 New Bag/Given   piperacillin -tazobactam (ZOSYN ) IVPB 2.25 g 2.25 g 100 mL/hr   07/07/23 0834 New Bag/Given   piperacillin -tazobactam (ZOSYN ) IVPB 2.25 g 2.25 g 100 mL/hr      Vanc 4/15 >>4/17; 4/19>> Zosyn  4/19>> Zosyn  4/15 >>4/17  4/8 - Blood - negF 4/15 - Bld - NGTD 4/15 MRSA PCR - negative 4/19 Bcx- sent   Thank you for allowing pharmacy to be a part of this patient's care.  Chrystie Crass, PharmD Clinical Pharmacist  07/07/2023 10:10 AM

## 2023-07-07 NOTE — Progress Notes (Signed)
 Patient's family requesting MRI of patient's back due to increased back pain and recent injections in lower back. Dr. Arrien made aware.

## 2023-07-07 NOTE — Plan of Care (Signed)
  Problem: Education: Goal: Individualized Educational Video(s) Outcome: Progressing   Problem: Coping: Goal: Ability to adjust to condition or change in health will improve Outcome: Progressing   Problem: Fluid Volume: Goal: Ability to maintain a balanced intake and output will improve Outcome: Progressing   Problem: Health Behavior/Discharge Planning: Goal: Ability to identify and utilize available resources and services will improve Outcome: Progressing Goal: Ability to manage health-related needs will improve Outcome: Progressing   Problem: Metabolic: Goal: Ability to maintain appropriate glucose levels will improve Outcome: Progressing   Problem: Nutritional: Goal: Maintenance of adequate nutrition will improve Outcome: Progressing Goal: Progress toward achieving an optimal weight will improve Outcome: Progressing   Problem: Skin Integrity: Goal: Risk for impaired skin integrity will decrease Outcome: Progressing   Problem: Tissue Perfusion: Goal: Adequacy of tissue perfusion will improve Outcome: Progressing   Problem: Education: Goal: Knowledge of General Education information will improve Description: Including pain rating scale, medication(s)/side effects and non-pharmacologic comfort measures Outcome: Progressing   Problem: Health Behavior/Discharge Planning: Goal: Ability to manage health-related needs will improve Outcome: Progressing   Problem: Clinical Measurements: Goal: Ability to maintain clinical measurements within normal limits will improve Outcome: Progressing Goal: Will remain free from infection Outcome: Progressing Goal: Diagnostic test results will improve Outcome: Progressing Goal: Respiratory complications will improve Outcome: Progressing Goal: Cardiovascular complication will be avoided Outcome: Progressing   Problem: Activity: Goal: Risk for activity intolerance will decrease Outcome: Progressing   Problem: Nutrition: Goal:  Adequate nutrition will be maintained Outcome: Progressing   Problem: Coping: Goal: Level of anxiety will decrease Outcome: Progressing   Problem: Elimination: Goal: Will not experience complications related to bowel motility Outcome: Progressing Goal: Will not experience complications related to urinary retention Outcome: Progressing   Problem: Pain Managment: Goal: General experience of comfort will improve and/or be controlled Outcome: Progressing   Problem: Safety: Goal: Ability to remain free from injury will improve Outcome: Progressing   Problem: Skin Integrity: Goal: Risk for impaired skin integrity will decrease Outcome: Progressing   Problem: Education: Goal: Ability to demonstrate management of disease process will improve Outcome: Progressing Goal: Ability to verbalize understanding of medication therapies will improve Outcome: Progressing Goal: Individualized Educational Video(s) Outcome: Progressing   Problem: Activity: Goal: Capacity to carry out activities will improve Outcome: Progressing   Problem: Cardiac: Goal: Ability to achieve and maintain adequate cardiopulmonary perfusion will improve Outcome: Progressing   Problem: Education: Goal: Knowledge of disease and its progression will improve Outcome: Progressing   Problem: Fluid Volume: Goal: Compliance with measures to maintain balanced fluid volume will improve Outcome: Progressing   Problem: Health Behavior/Discharge Planning: Goal: Ability to manage health-related needs will improve Outcome: Progressing   Problem: Nutritional: Goal: Ability to make healthy dietary choices will improve Outcome: Progressing   Problem: Clinical Measurements: Goal: Complications related to the disease process, condition or treatment will be avoided or minimized Outcome: Progressing

## 2023-07-08 ENCOUNTER — Inpatient Hospital Stay (HOSPITAL_COMMUNITY)
Admission: AD | Admit: 2023-07-08 | Discharge: 2023-08-01 | DRG: 939 | Disposition: A | Discharge status: Skilled Nursing Facility | Source: Intra-hospital | Attending: Physical Medicine and Rehabilitation | Admitting: Physical Medicine and Rehabilitation

## 2023-07-08 ENCOUNTER — Other Ambulatory Visit: Payer: Self-pay

## 2023-07-08 ENCOUNTER — Encounter (HOSPITAL_COMMUNITY): Payer: Self-pay | Admitting: Physical Medicine and Rehabilitation

## 2023-07-08 DIAGNOSIS — Z751 Person awaiting admission to adequate facility elsewhere: Secondary | ICD-10-CM

## 2023-07-08 DIAGNOSIS — K50911 Crohn's disease, unspecified, with rectal bleeding: Secondary | ICD-10-CM | POA: Diagnosis present

## 2023-07-08 DIAGNOSIS — M47812 Spondylosis without myelopathy or radiculopathy, cervical region: Secondary | ICD-10-CM | POA: Diagnosis present

## 2023-07-08 DIAGNOSIS — N185 Chronic kidney disease, stage 5: Secondary | ICD-10-CM | POA: Diagnosis not present

## 2023-07-08 DIAGNOSIS — I2510 Atherosclerotic heart disease of native coronary artery without angina pectoris: Secondary | ICD-10-CM | POA: Diagnosis present

## 2023-07-08 DIAGNOSIS — E872 Acidosis, unspecified: Secondary | ICD-10-CM | POA: Diagnosis not present

## 2023-07-08 DIAGNOSIS — Z955 Presence of coronary angioplasty implant and graft: Secondary | ICD-10-CM

## 2023-07-08 DIAGNOSIS — I132 Hypertensive heart and chronic kidney disease with heart failure and with stage 5 chronic kidney disease, or end stage renal disease: Secondary | ICD-10-CM | POA: Diagnosis not present

## 2023-07-08 DIAGNOSIS — D631 Anemia in chronic kidney disease: Secondary | ICD-10-CM | POA: Diagnosis not present

## 2023-07-08 DIAGNOSIS — K2950 Unspecified chronic gastritis without bleeding: Secondary | ICD-10-CM | POA: Diagnosis present

## 2023-07-08 DIAGNOSIS — L03114 Cellulitis of left upper limb: Secondary | ICD-10-CM | POA: Diagnosis present

## 2023-07-08 DIAGNOSIS — E1122 Type 2 diabetes mellitus with diabetic chronic kidney disease: Secondary | ICD-10-CM | POA: Diagnosis present

## 2023-07-08 DIAGNOSIS — N186 End stage renal disease: Secondary | ICD-10-CM | POA: Diagnosis not present

## 2023-07-08 DIAGNOSIS — Y718 Miscellaneous cardiovascular devices associated with adverse incidents, not elsewhere classified: Secondary | ICD-10-CM | POA: Diagnosis present

## 2023-07-08 DIAGNOSIS — M25469 Effusion, unspecified knee: Secondary | ICD-10-CM | POA: Insufficient documentation

## 2023-07-08 DIAGNOSIS — M50021 Cervical disc disorder at C4-C5 level with myelopathy: Secondary | ICD-10-CM | POA: Diagnosis not present

## 2023-07-08 DIAGNOSIS — N184 Chronic kidney disease, stage 4 (severe): Secondary | ICD-10-CM | POA: Diagnosis not present

## 2023-07-08 DIAGNOSIS — Z882 Allergy status to sulfonamides status: Secondary | ICD-10-CM

## 2023-07-08 DIAGNOSIS — Z832 Family history of diseases of the blood and blood-forming organs and certain disorders involving the immune mechanism: Secondary | ICD-10-CM

## 2023-07-08 DIAGNOSIS — D72829 Elevated white blood cell count, unspecified: Secondary | ICD-10-CM | POA: Diagnosis present

## 2023-07-08 DIAGNOSIS — F32A Depression, unspecified: Secondary | ICD-10-CM | POA: Diagnosis present

## 2023-07-08 DIAGNOSIS — Z7401 Bed confinement status: Secondary | ICD-10-CM | POA: Diagnosis not present

## 2023-07-08 DIAGNOSIS — M25462 Effusion, left knee: Secondary | ICD-10-CM | POA: Diagnosis not present

## 2023-07-08 DIAGNOSIS — T8453XA Infection and inflammatory reaction due to internal right knee prosthesis, initial encounter: Secondary | ICD-10-CM | POA: Diagnosis not present

## 2023-07-08 DIAGNOSIS — M01X61 Direct infection of right knee in infectious and parasitic diseases classified elsewhere: Secondary | ICD-10-CM | POA: Diagnosis not present

## 2023-07-08 DIAGNOSIS — M1711 Unilateral primary osteoarthritis, right knee: Secondary | ICD-10-CM | POA: Diagnosis present

## 2023-07-08 DIAGNOSIS — I10 Essential (primary) hypertension: Secondary | ICD-10-CM

## 2023-07-08 DIAGNOSIS — L02414 Cutaneous abscess of left upper limb: Secondary | ICD-10-CM | POA: Diagnosis not present

## 2023-07-08 DIAGNOSIS — N179 Acute kidney failure, unspecified: Secondary | ICD-10-CM | POA: Diagnosis not present

## 2023-07-08 DIAGNOSIS — K219 Gastro-esophageal reflux disease without esophagitis: Secondary | ICD-10-CM | POA: Diagnosis present

## 2023-07-08 DIAGNOSIS — Z841 Family history of disorders of kidney and ureter: Secondary | ICD-10-CM

## 2023-07-08 DIAGNOSIS — Z86718 Personal history of other venous thrombosis and embolism: Secondary | ICD-10-CM

## 2023-07-08 DIAGNOSIS — M4802 Spinal stenosis, cervical region: Secondary | ICD-10-CM | POA: Diagnosis present

## 2023-07-08 DIAGNOSIS — Z90710 Acquired absence of both cervix and uterus: Secondary | ICD-10-CM

## 2023-07-08 DIAGNOSIS — K449 Diaphragmatic hernia without obstruction or gangrene: Secondary | ICD-10-CM | POA: Diagnosis not present

## 2023-07-08 DIAGNOSIS — M48061 Spinal stenosis, lumbar region without neurogenic claudication: Secondary | ICD-10-CM | POA: Diagnosis present

## 2023-07-08 DIAGNOSIS — M00861 Arthritis due to other bacteria, right knee: Secondary | ICD-10-CM | POA: Diagnosis not present

## 2023-07-08 DIAGNOSIS — K5900 Constipation, unspecified: Secondary | ICD-10-CM | POA: Diagnosis not present

## 2023-07-08 DIAGNOSIS — F0390 Unspecified dementia without behavioral disturbance: Secondary | ICD-10-CM | POA: Diagnosis not present

## 2023-07-08 DIAGNOSIS — R5381 Other malaise: Principal | ICD-10-CM

## 2023-07-08 DIAGNOSIS — M545 Low back pain, unspecified: Secondary | ICD-10-CM | POA: Diagnosis present

## 2023-07-08 DIAGNOSIS — T82898D Other specified complication of vascular prosthetic devices, implants and grafts, subsequent encounter: Secondary | ICD-10-CM

## 2023-07-08 DIAGNOSIS — E119 Type 2 diabetes mellitus without complications: Secondary | ICD-10-CM

## 2023-07-08 DIAGNOSIS — K5730 Diverticulosis of large intestine without perforation or abscess without bleeding: Secondary | ICD-10-CM | POA: Diagnosis not present

## 2023-07-08 DIAGNOSIS — T82898S Other specified complication of vascular prosthetic devices, implants and grafts, sequela: Secondary | ICD-10-CM

## 2023-07-08 DIAGNOSIS — M109 Gout, unspecified: Secondary | ICD-10-CM | POA: Diagnosis not present

## 2023-07-08 DIAGNOSIS — M11262 Other chondrocalcinosis, left knee: Secondary | ICD-10-CM | POA: Diagnosis present

## 2023-07-08 DIAGNOSIS — Y832 Surgical operation with anastomosis, bypass or graft as the cause of abnormal reaction of the patient, or of later complication, without mention of misadventure at the time of the procedure: Secondary | ICD-10-CM | POA: Diagnosis present

## 2023-07-08 DIAGNOSIS — M6281 Muscle weakness (generalized): Secondary | ICD-10-CM | POA: Diagnosis not present

## 2023-07-08 DIAGNOSIS — Z884 Allergy status to anesthetic agent status: Secondary | ICD-10-CM

## 2023-07-08 DIAGNOSIS — Z8701 Personal history of pneumonia (recurrent): Secondary | ICD-10-CM

## 2023-07-08 DIAGNOSIS — R159 Full incontinence of feces: Secondary | ICD-10-CM | POA: Diagnosis present

## 2023-07-08 DIAGNOSIS — Z79899 Other long term (current) drug therapy: Secondary | ICD-10-CM

## 2023-07-08 DIAGNOSIS — Z8249 Family history of ischemic heart disease and other diseases of the circulatory system: Secondary | ICD-10-CM

## 2023-07-08 DIAGNOSIS — M009 Pyogenic arthritis, unspecified: Secondary | ICD-10-CM | POA: Diagnosis present

## 2023-07-08 DIAGNOSIS — F419 Anxiety disorder, unspecified: Secondary | ICD-10-CM | POA: Diagnosis present

## 2023-07-08 DIAGNOSIS — B9689 Other specified bacterial agents as the cause of diseases classified elsewhere: Secondary | ICD-10-CM | POA: Diagnosis not present

## 2023-07-08 DIAGNOSIS — I959 Hypotension, unspecified: Secondary | ICD-10-CM | POA: Diagnosis not present

## 2023-07-08 DIAGNOSIS — Z8719 Personal history of other diseases of the digestive system: Secondary | ICD-10-CM

## 2023-07-08 DIAGNOSIS — M47816 Spondylosis without myelopathy or radiculopathy, lumbar region: Secondary | ICD-10-CM | POA: Diagnosis present

## 2023-07-08 DIAGNOSIS — I6782 Cerebral ischemia: Secondary | ICD-10-CM | POA: Diagnosis not present

## 2023-07-08 DIAGNOSIS — M5137 Other intervertebral disc degeneration, lumbosacral region with discogenic back pain only: Secondary | ICD-10-CM | POA: Diagnosis present

## 2023-07-08 DIAGNOSIS — Z8673 Personal history of transient ischemic attack (TIA), and cerebral infarction without residual deficits: Secondary | ICD-10-CM

## 2023-07-08 DIAGNOSIS — R41841 Cognitive communication deficit: Secondary | ICD-10-CM | POA: Diagnosis not present

## 2023-07-08 DIAGNOSIS — Z888 Allergy status to other drugs, medicaments and biological substances status: Secondary | ICD-10-CM

## 2023-07-08 DIAGNOSIS — M25461 Effusion, right knee: Secondary | ICD-10-CM | POA: Diagnosis not present

## 2023-07-08 DIAGNOSIS — I252 Old myocardial infarction: Secondary | ICD-10-CM

## 2023-07-08 DIAGNOSIS — Z801 Family history of malignant neoplasm of trachea, bronchus and lung: Secondary | ICD-10-CM

## 2023-07-08 DIAGNOSIS — E8779 Other fluid overload: Secondary | ICD-10-CM | POA: Diagnosis not present

## 2023-07-08 DIAGNOSIS — Z9889 Other specified postprocedural states: Secondary | ICD-10-CM

## 2023-07-08 DIAGNOSIS — Z452 Encounter for adjustment and management of vascular access device: Secondary | ICD-10-CM | POA: Diagnosis not present

## 2023-07-08 DIAGNOSIS — I5032 Chronic diastolic (congestive) heart failure: Secondary | ICD-10-CM | POA: Diagnosis present

## 2023-07-08 DIAGNOSIS — D513 Other dietary vitamin B12 deficiency anemia: Secondary | ICD-10-CM | POA: Diagnosis not present

## 2023-07-08 DIAGNOSIS — M11261 Other chondrocalcinosis, right knee: Secondary | ICD-10-CM | POA: Diagnosis present

## 2023-07-08 DIAGNOSIS — E1140 Type 2 diabetes mellitus with diabetic neuropathy, unspecified: Secondary | ICD-10-CM | POA: Diagnosis present

## 2023-07-08 DIAGNOSIS — Z992 Dependence on renal dialysis: Secondary | ICD-10-CM

## 2023-07-08 DIAGNOSIS — I509 Heart failure, unspecified: Secondary | ICD-10-CM | POA: Diagnosis not present

## 2023-07-08 DIAGNOSIS — K209 Esophagitis, unspecified without bleeding: Secondary | ICD-10-CM | POA: Diagnosis present

## 2023-07-08 DIAGNOSIS — Z6828 Body mass index (BMI) 28.0-28.9, adult: Secondary | ICD-10-CM

## 2023-07-08 DIAGNOSIS — E1165 Type 2 diabetes mellitus with hyperglycemia: Secondary | ICD-10-CM | POA: Diagnosis present

## 2023-07-08 DIAGNOSIS — Z885 Allergy status to narcotic agent status: Secondary | ICD-10-CM

## 2023-07-08 DIAGNOSIS — M329 Systemic lupus erythematosus, unspecified: Secondary | ICD-10-CM | POA: Diagnosis present

## 2023-07-08 DIAGNOSIS — M5127 Other intervertebral disc displacement, lumbosacral region: Secondary | ICD-10-CM | POA: Diagnosis not present

## 2023-07-08 DIAGNOSIS — Z90722 Acquired absence of ovaries, bilateral: Secondary | ICD-10-CM

## 2023-07-08 DIAGNOSIS — I4510 Unspecified right bundle-branch block: Secondary | ICD-10-CM | POA: Diagnosis present

## 2023-07-08 DIAGNOSIS — G8929 Other chronic pain: Secondary | ICD-10-CM | POA: Diagnosis not present

## 2023-07-08 DIAGNOSIS — M898X9 Other specified disorders of bone, unspecified site: Secondary | ICD-10-CM | POA: Diagnosis present

## 2023-07-08 DIAGNOSIS — E875 Hyperkalemia: Secondary | ICD-10-CM | POA: Diagnosis present

## 2023-07-08 DIAGNOSIS — Z803 Family history of malignant neoplasm of breast: Secondary | ICD-10-CM

## 2023-07-08 DIAGNOSIS — Z923 Personal history of irradiation: Secondary | ICD-10-CM

## 2023-07-08 DIAGNOSIS — M5126 Other intervertebral disc displacement, lumbar region: Secondary | ICD-10-CM | POA: Diagnosis not present

## 2023-07-08 DIAGNOSIS — N2581 Secondary hyperparathyroidism of renal origin: Secondary | ICD-10-CM | POA: Diagnosis present

## 2023-07-08 DIAGNOSIS — Z800 Family history of malignant neoplasm of digestive organs: Secondary | ICD-10-CM

## 2023-07-08 DIAGNOSIS — K5901 Slow transit constipation: Secondary | ICD-10-CM | POA: Diagnosis not present

## 2023-07-08 DIAGNOSIS — K529 Noninfective gastroenteritis and colitis, unspecified: Secondary | ICD-10-CM | POA: Diagnosis present

## 2023-07-08 DIAGNOSIS — Z9049 Acquired absence of other specified parts of digestive tract: Secondary | ICD-10-CM

## 2023-07-08 DIAGNOSIS — E785 Hyperlipidemia, unspecified: Secondary | ICD-10-CM | POA: Diagnosis present

## 2023-07-08 DIAGNOSIS — M25562 Pain in left knee: Secondary | ICD-10-CM | POA: Diagnosis present

## 2023-07-08 DIAGNOSIS — Z8616 Personal history of COVID-19: Secondary | ICD-10-CM

## 2023-07-08 DIAGNOSIS — I130 Hypertensive heart and chronic kidney disease with heart failure and stage 1 through stage 4 chronic kidney disease, or unspecified chronic kidney disease: Secondary | ICD-10-CM | POA: Diagnosis not present

## 2023-07-08 DIAGNOSIS — I890 Lymphedema, not elsewhere classified: Secondary | ICD-10-CM | POA: Diagnosis present

## 2023-07-08 DIAGNOSIS — K2901 Acute gastritis with bleeding: Secondary | ICD-10-CM

## 2023-07-08 DIAGNOSIS — D509 Iron deficiency anemia, unspecified: Secondary | ICD-10-CM | POA: Diagnosis present

## 2023-07-08 DIAGNOSIS — Z794 Long term (current) use of insulin: Secondary | ICD-10-CM | POA: Diagnosis not present

## 2023-07-08 DIAGNOSIS — M25561 Pain in right knee: Secondary | ICD-10-CM | POA: Diagnosis not present

## 2023-07-08 DIAGNOSIS — I4891 Unspecified atrial fibrillation: Secondary | ICD-10-CM | POA: Diagnosis present

## 2023-07-08 DIAGNOSIS — L89626 Pressure-induced deep tissue damage of left heel: Secondary | ICD-10-CM | POA: Diagnosis not present

## 2023-07-08 DIAGNOSIS — R0600 Dyspnea, unspecified: Secondary | ICD-10-CM | POA: Diagnosis not present

## 2023-07-08 DIAGNOSIS — M1120 Other chondrocalcinosis, unspecified site: Secondary | ICD-10-CM | POA: Insufficient documentation

## 2023-07-08 DIAGNOSIS — D649 Anemia, unspecified: Secondary | ICD-10-CM | POA: Diagnosis not present

## 2023-07-08 DIAGNOSIS — M48062 Spinal stenosis, lumbar region with neurogenic claudication: Secondary | ICD-10-CM | POA: Diagnosis not present

## 2023-07-08 DIAGNOSIS — Z4682 Encounter for fitting and adjustment of non-vascular catheter: Secondary | ICD-10-CM | POA: Diagnosis not present

## 2023-07-08 DIAGNOSIS — Z7901 Long term (current) use of anticoagulants: Secondary | ICD-10-CM

## 2023-07-08 DIAGNOSIS — Z8042 Family history of malignant neoplasm of prostate: Secondary | ICD-10-CM

## 2023-07-08 DIAGNOSIS — Z833 Family history of diabetes mellitus: Secondary | ICD-10-CM

## 2023-07-08 DIAGNOSIS — I110 Hypertensive heart disease with heart failure: Secondary | ICD-10-CM | POA: Diagnosis not present

## 2023-07-08 DIAGNOSIS — R197 Diarrhea, unspecified: Secondary | ICD-10-CM | POA: Diagnosis not present

## 2023-07-08 DIAGNOSIS — E440 Moderate protein-calorie malnutrition: Secondary | ICD-10-CM | POA: Diagnosis not present

## 2023-07-08 DIAGNOSIS — Z853 Personal history of malignant neoplasm of breast: Secondary | ICD-10-CM

## 2023-07-08 DIAGNOSIS — I5033 Acute on chronic diastolic (congestive) heart failure: Secondary | ICD-10-CM | POA: Diagnosis not present

## 2023-07-08 DIAGNOSIS — M47813 Spondylosis without myelopathy or radiculopathy, cervicothoracic region: Secondary | ICD-10-CM | POA: Diagnosis present

## 2023-07-08 DIAGNOSIS — R2689 Other abnormalities of gait and mobility: Secondary | ICD-10-CM | POA: Diagnosis not present

## 2023-07-08 DIAGNOSIS — M4316 Spondylolisthesis, lumbar region: Secondary | ICD-10-CM | POA: Diagnosis not present

## 2023-07-08 LAB — CBC WITH DIFFERENTIAL/PLATELET
Abs Immature Granulocytes: 0.13 10*3/uL — ABNORMAL HIGH (ref 0.00–0.07)
Basophils Absolute: 0 10*3/uL (ref 0.0–0.1)
Basophils Relative: 0 %
Eosinophils Absolute: 0.1 10*3/uL (ref 0.0–0.5)
Eosinophils Relative: 1 %
HCT: 25.5 % — ABNORMAL LOW (ref 36.0–46.0)
Hemoglobin: 8 g/dL — ABNORMAL LOW (ref 12.0–15.0)
Immature Granulocytes: 1 %
Lymphocytes Relative: 12 %
Lymphs Abs: 1.2 10*3/uL (ref 0.7–4.0)
MCH: 28.9 pg (ref 26.0–34.0)
MCHC: 31.4 g/dL (ref 30.0–36.0)
MCV: 92.1 fL (ref 80.0–100.0)
Monocytes Absolute: 0.6 10*3/uL (ref 0.1–1.0)
Monocytes Relative: 6 %
Neutro Abs: 8.5 10*3/uL — ABNORMAL HIGH (ref 1.7–7.7)
Neutrophils Relative %: 80 %
Platelets: 296 10*3/uL (ref 150–400)
RBC: 2.77 MIL/uL — ABNORMAL LOW (ref 3.87–5.11)
RDW: 16.5 % — ABNORMAL HIGH (ref 11.5–15.5)
WBC: 10.5 10*3/uL (ref 4.0–10.5)
nRBC: 0.2 % (ref 0.0–0.2)

## 2023-07-08 LAB — RENAL FUNCTION PANEL
Albumin: 1.9 g/dL — ABNORMAL LOW (ref 3.5–5.0)
Anion gap: 20 — ABNORMAL HIGH (ref 5–15)
BUN: 69 mg/dL — ABNORMAL HIGH (ref 8–23)
CO2: 19 mmol/L — ABNORMAL LOW (ref 22–32)
Calcium: 8.8 mg/dL — ABNORMAL LOW (ref 8.9–10.3)
Chloride: 93 mmol/L — ABNORMAL LOW (ref 98–111)
Creatinine, Ser: 6.54 mg/dL — ABNORMAL HIGH (ref 0.44–1.00)
GFR, Estimated: 6 mL/min — ABNORMAL LOW (ref >60)
Glucose, Bld: 107 mg/dL — ABNORMAL HIGH (ref 70–99)
Phosphorus: 7.5 mg/dL — ABNORMAL HIGH (ref 2.5–4.6)
Potassium: 4.4 mmol/L (ref 3.5–5.1)
Sodium: 132 mmol/L — ABNORMAL LOW (ref 135–145)

## 2023-07-08 LAB — GLUCOSE, CAPILLARY
Glucose-Capillary: 128 mg/dL — ABNORMAL HIGH (ref 70–99)
Glucose-Capillary: 144 mg/dL — ABNORMAL HIGH (ref 70–99)

## 2023-07-08 MED ORDER — ACETAMINOPHEN 325 MG PO TABS
325.0000 mg | ORAL_TABLET | ORAL | Status: DC | PRN
  Administered 2023-07-14 – 2023-07-28 (×15): 650 mg via ORAL
  Filled 2023-07-08 (×16): qty 2

## 2023-07-08 MED ORDER — COLESTIPOL HCL 1 G PO TABS
1.0000 g | ORAL_TABLET | Freq: Three times a day (TID) | ORAL | Status: DC
  Administered 2023-07-08 – 2023-07-10 (×6): 1 g via ORAL
  Filled 2023-07-08 (×7): qty 1

## 2023-07-08 MED ORDER — PANCRELIPASE (LIP-PROT-AMYL) 36000-114000 UNITS PO CPEP
36000.0000 [IU] | ORAL_CAPSULE | Freq: Three times a day (TID) | ORAL | Status: DC
  Administered 2023-07-08 – 2023-08-01 (×70): 36000 [IU] via ORAL
  Filled 2023-07-08 (×73): qty 1

## 2023-07-08 MED ORDER — APIXABAN 5 MG PO TABS: 5.0000 mg | ORAL_TABLET | Freq: Two times a day (BID) | ORAL | 0 refills | Status: DC

## 2023-07-08 MED ORDER — TORSEMIDE 20 MG PO TABS
100.0000 mg | ORAL_TABLET | ORAL | Status: DC
  Administered 2023-07-14: 100 mg via ORAL
  Filled 2023-07-08: qty 5

## 2023-07-08 MED ORDER — CALCIUM CARBONATE ANTACID 500 MG PO CHEW: 1.0000 | CHEWABLE_TABLET | Freq: Four times a day (QID) | ORAL | Status: DC | PRN

## 2023-07-08 MED ORDER — HYDROCODONE-ACETAMINOPHEN 5-325 MG PO TABS
1.0000 | ORAL_TABLET | ORAL | Status: DC | PRN
  Administered 2023-07-08 – 2023-07-11 (×9): 2 via ORAL
  Administered 2023-07-11: 1 via ORAL
  Administered 2023-07-12 – 2023-07-13 (×5): 2 via ORAL
  Administered 2023-07-14 (×2): 1 via ORAL
  Administered 2023-07-15 (×3): 2 via ORAL
  Administered 2023-07-16: 1 via ORAL
  Administered 2023-07-16: 2 via ORAL
  Filled 2023-07-08 (×8): qty 2
  Filled 2023-07-08: qty 1
  Filled 2023-07-08 (×14): qty 2

## 2023-07-08 MED ORDER — TORSEMIDE 20 MG PO TABS
100.0000 mg | ORAL_TABLET | ORAL | Status: DC
  Administered 2023-07-10 – 2023-07-19 (×5): 100 mg via ORAL
  Filled 2023-07-08 (×5): qty 5

## 2023-07-08 MED ORDER — DIPHENHYDRAMINE HCL 25 MG PO CAPS: 25.0000 mg | ORAL_CAPSULE | Freq: Four times a day (QID) | ORAL | Status: DC | PRN

## 2023-07-08 MED ORDER — PROCHLORPERAZINE EDISYLATE 10 MG/2ML IJ SOLN: 5.0000 mg | Freq: Four times a day (QID) | INTRAMUSCULAR | Status: DC | PRN

## 2023-07-08 MED ORDER — SEVELAMER CARBONATE 800 MG PO TABS
1600.0000 mg | ORAL_TABLET | Freq: Three times a day (TID) | ORAL | Status: DC
  Filled 2023-07-08: qty 2

## 2023-07-08 MED ORDER — HEPARIN SODIUM (PORCINE) 1000 UNIT/ML IJ SOLN
INTRAMUSCULAR | Status: AC
Start: 2023-07-08 — End: 2023-07-08
  Administered 2023-07-08: 3200 [IU]
  Filled 2023-07-08: qty 4

## 2023-07-08 MED ORDER — MIRTAZAPINE 15 MG PO TABS
7.5000 mg | ORAL_TABLET | Freq: Every day | ORAL | Status: DC
  Administered 2023-07-08 – 2023-07-30 (×23): 7.5 mg via ORAL
  Filled 2023-07-08 (×23): qty 1

## 2023-07-08 MED ORDER — PANTOPRAZOLE SODIUM 40 MG PO TBEC
40.0000 mg | DELAYED_RELEASE_TABLET | Freq: Two times a day (BID) | ORAL | Status: DC
  Administered 2023-07-08 – 2023-08-01 (×47): 40 mg via ORAL
  Filled 2023-07-08 (×47): qty 1

## 2023-07-08 MED ORDER — SIMETHICONE 80 MG PO CHEW: 80.0000 mg | CHEWABLE_TABLET | Freq: Four times a day (QID) | ORAL | Status: DC | PRN

## 2023-07-08 MED ORDER — MELATONIN 3 MG PO TABS
3.0000 mg | ORAL_TABLET | Freq: Every evening | ORAL | Status: DC | PRN
  Administered 2023-07-11 – 2023-07-26 (×3): 3 mg via ORAL
  Filled 2023-07-08 (×2): qty 1

## 2023-07-08 MED ORDER — INSULIN ASPART 100 UNIT/ML IJ SOLN: 0.0000 [IU] | Freq: Three times a day (TID) | INTRAMUSCULAR | Status: DC

## 2023-07-08 MED ORDER — AMOXICILLIN-POT CLAVULANATE 500-125 MG PO TABS
1.0000 | ORAL_TABLET | ORAL | Status: DC
Start: 2023-07-08 — End: 2023-07-12
  Administered 2023-07-08 – 2023-07-11 (×4): 1 via ORAL
  Filled 2023-07-08 (×5): qty 1

## 2023-07-08 MED ORDER — AMOXICILLIN-POT CLAVULANATE 500-125 MG PO TABS
1.0000 | ORAL_TABLET | ORAL | Status: DC
  Filled 2023-07-08: qty 1

## 2023-07-08 MED ORDER — DARBEPOETIN ALFA 150 MCG/0.3ML IJ SOSY: 150.0000 ug | PREFILLED_SYRINGE | INTRAMUSCULAR | Status: DC

## 2023-07-08 MED ORDER — FLUOXETINE HCL 20 MG PO CAPS
20.0000 mg | ORAL_CAPSULE | Freq: Every day | ORAL | Status: DC
  Administered 2023-07-09 – 2023-08-01 (×24): 20 mg via ORAL
  Filled 2023-07-08 (×24): qty 1

## 2023-07-08 MED ORDER — GABAPENTIN 300 MG PO CAPS
300.0000 mg | ORAL_CAPSULE | Freq: Every day | ORAL | Status: DC
Start: 2023-07-08 — End: 2023-08-01
  Administered 2023-07-08 – 2023-07-30 (×23): 300 mg via ORAL
  Filled 2023-07-08 (×23): qty 1

## 2023-07-08 MED ORDER — SEVELAMER CARBONATE 800 MG PO TABS
1600.0000 mg | ORAL_TABLET | Freq: Three times a day (TID) | ORAL | Status: DC
  Administered 2023-07-08 – 2023-08-01 (×64): 1600 mg via ORAL
  Filled 2023-07-08 (×67): qty 2

## 2023-07-08 MED ORDER — TORSEMIDE 20 MG PO TABS: 100.0000 mg | ORAL_TABLET | ORAL | Status: DC

## 2023-07-08 MED ORDER — FOLIC ACID 1 MG PO TABS
1.0000 mg | ORAL_TABLET | Freq: Every day | ORAL | Status: DC
  Administered 2023-07-09: 1 mg via ORAL
  Filled 2023-07-08: qty 1

## 2023-07-08 MED ORDER — ORAL CARE MOUTH RINSE: 15.0000 mL | OROMUCOSAL | Status: DC | PRN

## 2023-07-08 MED ORDER — BISACODYL 10 MG RE SUPP
10.0000 mg | Freq: Every day | RECTAL | Status: DC | PRN
  Filled 2023-07-08: qty 1

## 2023-07-08 MED ORDER — GUAIFENESIN-DM 100-10 MG/5ML PO SYRP: 5.0000 mL | ORAL_SOLUTION | Freq: Four times a day (QID) | ORAL | Status: DC | PRN

## 2023-07-08 MED ORDER — MILK AND MOLASSES ENEMA: 1.0000 | Freq: Every day | RECTAL | Status: DC | PRN

## 2023-07-08 MED ORDER — APIXABAN 5 MG PO TABS
5.0000 mg | ORAL_TABLET | Freq: Two times a day (BID) | ORAL | Status: DC
  Administered 2023-07-08 – 2023-07-23 (×31): 5 mg via ORAL
  Filled 2023-07-08 (×31): qty 1

## 2023-07-08 MED ORDER — SEVELAMER CARBONATE 800 MG PO TABS: 1600.0000 mg | ORAL_TABLET | Freq: Three times a day (TID) | ORAL | 0 refills | Status: DC

## 2023-07-08 MED ORDER — VANCOMYCIN IV (FOR PTA / DISCHARGE USE ONLY): 750.0000 mg | INTRAVENOUS | Status: DC

## 2023-07-08 MED ORDER — HYDROCODONE-ACETAMINOPHEN 5-325 MG PO TABS: 1.0000 | ORAL_TABLET | Freq: Three times a day (TID) | ORAL | 0 refills | Status: DC | PRN

## 2023-07-08 MED ORDER — RENA-VITE PO TABS
1.0000 | ORAL_TABLET | Freq: Every day | ORAL | Status: DC
  Administered 2023-07-08 – 2023-07-30 (×23): 1 via ORAL
  Filled 2023-07-08 (×23): qty 1

## 2023-07-08 MED ORDER — CHLORHEXIDINE GLUCONATE CLOTH 2 % EX PADS
6.0000 | MEDICATED_PAD | Freq: Every day | CUTANEOUS | Status: DC
  Administered 2023-07-09: 6 via TOPICAL

## 2023-07-08 MED ORDER — VANCOMYCIN VARIABLE DOSE PER UNSTABLE RENAL FUNCTION (PHARMACIST DOSING): Status: DC

## 2023-07-08 MED ORDER — AMOXICILLIN-POT CLAVULANATE 500-125 MG PO TABS
1.0000 | ORAL_TABLET | ORAL | 0 refills | Status: DC
Start: 2023-07-08 — End: 2023-08-01

## 2023-07-08 MED ORDER — TIZANIDINE HCL 4 MG PO TABS
2.0000 mg | ORAL_TABLET | Freq: Three times a day (TID) | ORAL | Status: DC | PRN
  Administered 2023-07-12 – 2023-07-26 (×3): 2 mg via ORAL
  Filled 2023-07-08 (×4): qty 1

## 2023-07-08 MED ORDER — ACETAMINOPHEN 325 MG PO TABS: 650.0000 mg | ORAL_TABLET | Freq: Four times a day (QID) | ORAL | Status: DC | PRN

## 2023-07-08 MED ORDER — RIVASTIGMINE TARTRATE 1.5 MG PO CAPS
1.5000 mg | ORAL_CAPSULE | Freq: Two times a day (BID) | ORAL | Status: DC
  Administered 2023-07-08 – 2023-08-01 (×46): 1.5 mg via ORAL
  Filled 2023-07-08 (×48): qty 1

## 2023-07-08 MED ORDER — PROCHLORPERAZINE MALEATE 5 MG PO TABS: 5.0000 mg | ORAL_TABLET | Freq: Four times a day (QID) | ORAL | Status: DC | PRN

## 2023-07-08 MED ORDER — VANCOMYCIN HCL 750 MG/150ML IV SOLN
750.0000 mg | Freq: Once | INTRAVENOUS | Status: AC
  Administered 2023-07-08: 750 mg via INTRAVENOUS
  Filled 2023-07-08: qty 150

## 2023-07-08 MED ORDER — ALUMINUM HYDROXIDE GEL 320 MG/5ML PO SUSP
10.0000 mL | Freq: Four times a day (QID) | ORAL | Status: DC | PRN
  Filled 2023-07-08: qty 30

## 2023-07-08 MED ORDER — CALCIUM CARBONATE ANTACID 500 MG PO CHEW
1.0000 | CHEWABLE_TABLET | Freq: Three times a day (TID) | ORAL | Status: DC | PRN
  Administered 2023-07-23: 200 mg via ORAL
  Filled 2023-07-08: qty 1

## 2023-07-08 MED ORDER — PROCHLORPERAZINE 25 MG RE SUPP: 12.5000 mg | Freq: Four times a day (QID) | RECTAL | Status: DC | PRN

## 2023-07-08 NOTE — Progress Notes (Signed)
 Pt arrived on unit 1412, received report from [place], RN, vitals are stable and pt settled.

## 2023-07-08 NOTE — Progress Notes (Signed)
 PT Cancellation Note  Patient Details Name: [name] MRN: [id number] DOB: 1945-03-30   Cancelled Treatment:    Reason Eval/Treat Not Completed: Patient at procedure or test/unavailable (HD).   Amey Ka, PT  Acute Rehab Services Secure chat preferred Office [phone]    Deloris Fetters Shizuo Biskup 07/08/2023, 12:00 PM

## 2023-07-08 NOTE — Progress Notes (Signed)
 PMR Admission Coordinator Pre-Admission Assessment   Patient: [name] is an 78 y.o., female MRN: [id number] DOB: Feb 13, 1946 Height: (P) 5\' 4"  (162.6 cm) Weight: 71.9 kg                                                                                                                                                  Insurance Information HMO: yes    PPO:      PCP:      IPA:      80/20:      OTHER:  PRIMARYElihu [name] [employer] HMO      Policy#: [id number]      Subscriber: pt CM Name: [name]  Phone#: [phone]   (ext [phone])   Fax#: [phone]     Pt. Approved via fax from [name] for admit 07/08/23 for admit [birth year]   Pre-Cert#: [id number]      Employer:  Benefits:  Phone #: [phone]      Name:  Eff Date: 05/18/2023- 03/18/2024   Deductible: does not have one   OOP Max: $9,350 ($8,416.60 met)   CIR: $399/day co-pay with a max co-pay of $2,793/admission (7 days)   SNF: $10/day co-pay for days 1-20, $214/day co-pay for days 21-100, limited to 100 days/cal yr   Outpatient:  $25 copay/visit Home Health:  100% coverage   DME: 80% coverage; 20% co-insurance   Providers: in network SECONDARYJona [name]      Policy#: [id number]      Phone#:    Financial Counselor:       Phone#:    The "Data Collection Information Summary" for patients in Inpatient Rehabilitation Facilities with attached "Privacy Act Statement-Health Care Records" was provided and verbally reviewed with: Patient and Family   Emergency Contact Information Contact Information       Name Relation Home Work [place] Daughter     [phone]         Other Contacts       Name Relation Home Work Mobile    [place] Daughter [phone]      [name] Granddaughter     [phone]    [name] Daughter [phone]        [name] Granddaughter     [phone]         Current Medical History  Patient Admitting Diagnosis: Debility due to CHF complicated by  renal failure   History of Present Illness: [name] is a 78 y.o. female with PMHx of  has a past medical history of Abdominal discomfort, Adnexal mass, Anemia, Breast cancer, CAD, CHF, Chronic kidney disease, stage 4, Chronic pain, COVID-19 (11/02/2020), Diabetes mellitus type 2, Gout, Hyperlipidemia, Hypertension, Lupus, SBO, Secondary hyperparathyroidism, Stroke, Vitamin B12 deficiency, Vitamin D  deficiency, and Wears dentures. She was admitted to [facility] on 06/15/2023 for lower extremity swelling and weight  gain of 20 pounds, shortness of breath at home.     Felt to be heart failure exacerbation in the setting of progressive renal disease.  Notable for recent hospital admissions for volume overload 3/8 to 3/15.  She was diuresed with IV diuretics with worsening renal function, ultimately had internal jugular tunneled catheter placed and left upper extremity fistula placed, started HD on 4/4.  On 4/7, she developed signs of cellulitis at the fistula site, and was started on antibiotics.  AV graft excision performed 4/13.  Postop, she developed leukocytosis and low-grade fever, was started on vancomycin  and Zosyn  initially but since antibiotics have been stopped.  Hospitalization was otherwise complicated by acute on chronic anemia, requiring blood transfusions on 3/30 and 4/16; type 2 diabetes, gout flare, poor pain control, and depression  Patient's medical record from [facility] has been reviewed by the rehabilitation admission coordinator and physician.   Past Medical History      Past Medical History:  Diagnosis Date   Abdominal discomfort      Chronic N/V/D. Presumptive dx Crohn's dx per elevated p ANCA. Failed Entocort and Pentasa. Sep 2003 - ileocolectomy c anastomosis per Dr Duwaine Gins 2/2 adhesions - path was hegative for Crohns. EGD, Sm bowel follow through (11/03), and an eteroclysis (10/03) were unrevealing. Cuases hypomag and hypocalcemia.   Adnexal mass 10/2001     s/p lap BSO (R ovarian fibroma) & lysis of adhesions   Allergy      Seasonal   Anemia      Multifactorial. Baseline HgB 10-11 ish. B12 def - 150 in 3/10. Fe Def - ferritin 35 3/10. Both are being repleted.   Breast cancer (HCC) 03/16/2013    right, 5 o'clock   CAD (coronary artery disease) 1996    1996 - PTCA and angioplasty diagonal branch. 2000 - Rotoblator & angiopllasty of diagonal. 2006 - subendocardial AMI, DES to proximal LAD.[name] Also had 90% stenosis in distal apical LAD. EF 55 with apical hypokinesis. Indefinite ASA and Plavix .   CHF (congestive heart failure) (HCC)     Chronic kidney disease, stage 4 (severe) (HCC)     Chronic pain      CT 10/10 = Spinal stenosis L2 - S1.   COVID-19 11/02/2020   Diabetes mellitus      Insulin  dependent   Diabetes mellitus type 2 in obese 05/03/2010    Managed on lantus  and novolog . Has diabetic nephropathy. Metformin D/C'd 2012 2/2 creatinine.  No diabetic retinopathy per 3/11.    Gout     Hx of radiation therapy 06/02/13- 07/16/13    right rbeast 4500 cGy 25 sessions, right breast boost 1600 cGy in 8 sessions   Hyperlipidemia      Managed with both a statin and Welchol . Welchol  stopped 2014 2/2 cost and started on fenofibrate     Hypertension      2006 B renal arteries patent. 2003 MRA - no RAS. 2003 pheo W/U Dr Francetta Innocent reportedly negative.   Hypoxia 07/23/2017   Lupus     Lymphedema of breast     Personal history of radiation therapy 2015   Pneumonia of left lung due to infectious organism     RBBB     Renal vein thrombosis (HCC)     SBO (small bowel obstruction) (HCC) 09/17/2017   Secondary hyperparathyroidism (HCC)     Stroke (HCC)      Incidental finding MRI 2002 L lacunar infarct   Vitamin B12 deficiency     Vitamin D   deficiency     Wears dentures      top        Has the patient had major surgery during 100 days prior to admission? Yes   Family History  family history includes Breast cancer in her maternal aunt and maternal  grandmother; Breast cancer (age of onset: 90) in her mother; Clotting disorder in her maternal grandmother; Colon cancer in her maternal grandmother; Diabetes in her maternal grandmother; Heart attack in her maternal grandmother; Hypertension in her daughter; Kidney failure in her maternal aunt; Lung cancer in her maternal aunt; Pancreatic cancer in an other family member; Pancreatic cancer (age of onset: 66) in her brother; Prostate cancer in her maternal uncle.     Current Medications   Current Medications    Current Facility-Administered Medications:    0.9 %  sodium chloride  infusion (Manually program via Guardrails IV Fluids), , Intravenous, Once, Arrien, Curlee Doss, MD   acetaminophen  (TYLENOL ) tablet 650 mg, 650 mg, Oral, Q6H PRN, Opyd, Timothy S, MD, 650 mg at 07/03/23 1423   apixaban  (ELIQUIS ) tablet 5 mg, 5 mg, Oral, BID, Arrien, Mauricio Daniel, MD   Chlorhexidine  Gluconate Cloth 2 % PADS 6 each, 6 each, Topical, Q0600, Goldsborough, Kellie, MD, 6 each at 07/05/23 0550   colestipol  (COLESTID ) tablet 1 g, 1 g, Oral, TID, Rhyne, Samantha J, PA-C, 1 g at 07/04/23 2134   Darbepoetin Alfa  (ARANESP ) injection 150 mcg, 150 mcg, Subcutaneous, Q Sun-1800, Goldsborough, Kellie, MD, 150 mcg at 06/30/23 1800   FLUoxetine  (PROZAC ) capsule 20 mg, 20 mg, Oral, Daily, Rhyne, Samantha J, PA-C, 20 mg at 07/04/23 8295   folic acid  (FOLVITE ) tablet 1 mg, 1 mg, Oral, Daily, Rhyne, Samantha J, PA-C, 1 mg at 07/04/23 6213   gabapentin  (NEURONTIN ) capsule 300 mg, 300 mg, Oral, QHS, Rhyne, Samantha J, PA-C, 300 mg at 07/04/23 2134   HYDROcodone -acetaminophen  (NORCO/VICODIN) 5-325 MG per tablet 1-2 tablet, 1-2 tablet, Oral, Q4H PRN, Eveland, Matthew, PA-C, 2 tablet at 07/04/23 0848   HYDROmorphone  (DILAUDID ) injection 0.5 mg, 0.5 mg, Intravenous, Q3H PRN, Deforest Fast, MD, 0.5 mg at 07/03/23 1834   insulin  aspart (novoLOG ) injection 0-6 Units, 0-6 Units, Subcutaneous, TID WC, Arrien, Mauricio Daniel, MD,  1 Units at 07/04/23 1713   lipase/protease/amylase (CREON ) capsule 36,000 Units, 36,000 Units, Oral, TID AC, Rhyne, Samantha J, PA-C, 36,000 Units at 07/05/23 0550   mirtazapine  (REMERON ) tablet 7.5 mg, 7.5 mg, Oral, QHS, Rhyne, Samantha J, PA-C, 7.5 mg at 07/04/23 2134   multivitamin (RENA-VIT) tablet 1 tablet, 1 tablet, Oral, QHS, Rhyne, Samantha J, PA-C, 1 tablet at 07/04/23 2134   pantoprazole  (PROTONIX ) EC tablet 40 mg, 40 mg, Oral, BID, Rhyne, Samantha J, PA-C, 40 mg at 07/04/23 2134   polyethylene glycol (MIRALAX  / GLYCOLAX ) packet 17 g, 17 g, Oral, Daily, Rhyne, Samantha J, PA-C, 17 g at 07/04/23 0848   rivastigmine  (EXELON ) capsule 1.5 mg, 1.5 mg, Oral, BID, Rhyne, Samantha J, PA-C, 1.5 mg at 07/04/23 1712   sevelamer  carbonate (RENVELA ) tablet 800 mg, 800 mg, Oral, TID WC, Rhyne, Samantha J, PA-C, 800 mg at 07/04/23 1711   tiZANidine  (ZANAFLEX ) tablet 2 mg, 2 mg, Oral, Q8H PRN, Rhyne, Samantha J, PA-C, 2 mg at 07/04/23 0865   torsemide  (DEMADEX ) tablet 100 mg, 100 mg, Oral, Q T,Th,S,Su, Leandra Pro, MD, 100 mg at 07/04/23 7846     Patients Current Diet:  Diet Order  Diet renal with fluid restriction Fluid consistency: Thin  Diet effective now             Diet - low sodium heart healthy                         Precautions / Restrictions Precautions Precautions: Fall Precaution/Restrictions Comments: L UE edema at site with AVG excision Restrictions Weight Bearing Restrictions Per Provider Order: No    Has the patient had 2 or more falls or a fall with injury in the past year?No   Prior Activity Level Limited Community (1-2x/wk): supervision with RW, did not drive   Prior Functional Level Prior Function Prior Level of Function : Needs assist Mobility Comments: Rollator or RW ADLs Comments: had assist for medications, driving, was to get grabbar and shower chair after last stay but never delivered per grandaughter in the room   Self Care: Did  the patient need help bathing, dressing, using the toilet or eating?  Independent   Indoor Mobility: Did the patient need assistance with walking from room to room (with or without device)? Independent   Stairs: Did the patient need assistance with internal or external stairs (with or without device)? Independent   Functional Cognition: Did the patient need help planning regular tasks such as shopping or remembering to take medications? Needed some help   Patient Information Are you of Hispanic, Latino/a,or Spanish origin?: A. No, not of Hispanic, Latino/a, or Spanish origin What is your race?: B. Black or African American Do you need or want an interpreter to communicate with a doctor or health care staff?: 0. No   Patient's Response To:  Health Literacy and Transportation Is the patient able to respond to health literacy and transportation needs?: Yes Health Literacy - How often do you need to have someone help you when you read instructions, pamphlets, or other written material from your doctor or pharmacy?: Never In the past 12 months, has lack of transportation kept you from medical appointments or from getting medications?: No In the past 12 months, has lack of transportation kept you from meetings, work, or from getting things needed for daily living?: No   Home Assistive Devices / Equipment Home Equipment: Agricultural consultant (2 wheels), Rollator (4 wheels), [employer] - single point   Prior Device Use: Indicate devices/aids used by the patient prior to current illness, exacerbation or injury? Walker and cane   Current Functional Level Cognition   Orientation Level: Oriented to person, Oriented to place    Extremity Assessment (includes Sensation/Coordination)   Upper Extremity Assessment: LUE deficits/detail, RUE deficits/detail RUE Deficits / Details: AAROM WFL, limited AROM shoulder flexion to about 80 degrees and painful with limited external rotation; strength shoulder flexion  2+/5, elbow extension 2/5, elbow flexion 4-/5 RUE Sensation: WNL LUE Deficits / Details: edema in hand and arm with limited grip esp ring and pinky finger with testing (Though able to take meds in a cup with L hand); elbow extension tight but WFL, flexion AROM WFL though slow and needed encouragement, shoulder AAROM limited by pain about 100 degrees, strength grossly 3/5 elbow, 2/5 shoulder LUE: Shoulder pain with ROM  Lower Extremity Assessment: RLE deficits/detail, LLE deficits/detail RLE Deficits / Details: AAROM limited in sitting with legs elevated c/o pain in legs with resisted ankle DF and strength hip 2/5, knee extension 4-/5 LLE Deficits / Details: AAROM limited in sitting with legs elevated c/o pain in legs with resisted ankle DF and strength  hip 2/5, knee extension 4-/5     ADLs   Overall ADL's : Needs assistance/impaired Eating/Feeding: Independent, Sitting Grooming: Supervision/safety, Standing Upper Body Bathing: Standing, Supervision/ safety Upper Body Bathing Details (indicate cue type and reason): UE support Lower Body Bathing: Sit to/from stand, Supervison/ safety Lower Body Bathing Details (indicate cue type and reason): UE support Upper Body Dressing : Set up, Sitting Lower Body Dressing: Set up, Sitting/lateral leans Toilet Transfer: Rolling walker (2 wheels), Supervision/safety, Ambulation Toileting- Clothing Manipulation and Hygiene: Supervision/safety, Sit to/from stand Functional mobility during ADLs: Supervision/safety, Rolling walker (2 wheels) General ADL Comments: Reinforced education of CHF diet and exacerbation prevention. Pt negotiated 2 steps with CGA, eduacted pt to go up the stairs facing rail and using it as support     Mobility   General bed mobility comments: in recliner     Transfers   Overall transfer level: Needs assistance Equipment used: Rolling walker (2 wheels) Transfers: Sit to/from Stand Sit to Stand: Mod assist General transfer comment:  lifting assist with cues for hand placement and increased time from recliner, then to and from 3:1 over toilet in bathroom     Ambulation / Gait / Stairs / Wheelchair Mobility   Ambulation/Gait Ambulation/Gait assistance: Min assist, Mod assist Gait Distance (Feet): 12 Feet (&8') Assistive device: Left platform walker Gait Pattern/deviations: Step-to pattern, Decreased stride length, Trunk flexed, Shuffle, Wide base of support General Gait Details: more difficulty progressing R LE than L and increased time with cues for walking forward to end of the bed, then noted she needed to toilet so turned with A for walker, balance and stepping to get to bathroom, after toileted walked to recliner     Posture / Balance Dynamic Sitting Balance Sitting balance - Comments: in recliner with feet on the floor no LOB Balance Overall balance assessment: Needs assistance Sitting-balance support: No upper extremity supported, Feet supported Sitting balance-Leahy Scale: Fair Sitting balance - Comments: in recliner with feet on the floor no LOB Standing balance support: Bilateral upper extremity supported Standing balance-Leahy Scale: Poor Standing balance comment: UE support needed for balance and with turning walker LOB posterior mod A to recover     Special needs/care consideration OP hemodialysis arranged with Renal navigator MWF at [facility] [place] application completed        Previous Home Environment  Living Arrangements:  (lives with sister in Social worker, [place])  Lives With: Family Available Help at Discharge: Family, Available 24 hours/day Type of Home: House Home Layout: Two level, 1/2 bath on main level, Able to live on main level with bedroom/bathroom Alternate Level Stairs-Rails: Right Alternate Level Stairs-Number of Steps: 12 Home Access: Level entry Bathroom Shower/Tub: Associate Professor: Yes Home Care Services: Yes Type of Home Care  Services: Home OT, Homehealth aide, Home PT (SW) Home Care Agency (if known): [facility] Additional Comments: information obtained from chart, patient with dementia at baseline   Discharge Living Setting Plans for Discharge Living Setting: Lives with (comment) (daughter in law, Danita) Type of Home at Discharge: House Discharge Home Layout: Two level, 1/2 bath on main level, Able to live on main level with bedroom/bathroom Alternate Level Stairs-Rails: Right Alternate Level Stairs-Number of Steps: 12 Discharge Home Access: Level entry Discharge Bathroom Shower/Tub: Tub/shower unit Discharge Bathroom Toilet: Standard Discharge Bathroom Accessibility: Yes How Accessible: Accessible via walker Does the patient have any problems obtaining your medications?: No   Social/Family/Support Systems Patient Roles: Parent Contact Information: [name] Perry-Tomaro,  daughter Anticipated Caregiver: daughter in law, Genoveva Kidney and other family members Anticipated Caregiver's Contact Information: see contacts Ability/Limitations of Caregiver: none Caregiver Availability: 24/7 Discharge Plan Discussed with Primary Caregiver: Yes Is Caregiver In Agreement with Plan?: Yes Does Caregiver/Family have Issues with Lodging/Transportation while Pt is in Rehab?: No   Goals Patient/Family Goal for Rehab: supervision with PT, OT and SLP Expected length of stay: ELOS 10 to 14 days Pt/Family Agrees to Admission and willing to participate: Yes Program Orientation Provided & Reviewed with Pt/Caregiver Including Roles  & Responsibilities: Yes   Decrease burden of Care through IP rehab admission: n/a   Possible need for SNF placement upon discharge:not anticipated   Patient Condition: This patient's condition remains as documented in the consult dated 07/05/23, in which the Rehabilitation Physician determined and documented that the patient's condition is appropriate for intensive rehabilitative care in an  inpatient rehabilitation facility. Will admit to inpatient rehab today.   Preadmission Screen Completed By:  Tanya Fantasia, RN, MSN 07/05/2023 1:01 PM ______________________________________________________________________   Discussed status with Dr. Rachel Budds on 07/08/23 at 900 and received approval for admission today.   Admission Coordinator:  Tanya Fantasia RN MSN time 1214/Date 07/08/23

## 2023-07-08 NOTE — H&P (Signed)
 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 She was admitted on 06/11/23 with progressive SOB with weight gain,  HF exacerbation and attempts at IV diuresis ineffective and required HD. Internal jugular and left brachial artery to axillary PTFE AV graft placed by Dr. Vikki Graves on 04/04.  She developed mild steal syndrome with numbness right hand. She developed fever  with leucocytosis and new erythema L-AC site felt to  be graft reaction v/s infection and was  treated with Keflex  04/07- 04/16.  Due to ongoing issues with pain secondary to steal syndrome, LUE AV graft removed with vein patch angioplasty on 04/14.   She started having rise in WBC with low grade fevers on 04/15 and antibiotics changed to Zosyn  on 04/17 but continued to have fevers with with rise in WBC therefore Vancomycin  added 04/19 and repeat BC X 2 negative but pending.  She has been requiring IV dilaudid  and transitioned to zanafex and Vicodin. MRI LUE done yesterday revealing mild to moderate elbow joint effusion with common flexor tendinosis and possibility of inflammatory phlegmon 1.8 X 0.5 X 1.5 cm just anterior to muscle at biceps musculotendionsis junction question developing abscess.  VVS recommends continuing antibiotic therapy and Dr.Arrien recommended changing Zosyn  to Augmentin  to continue 10 day course of treatment. Medical issues optimized but she continues to be limited by debility, pain, impaired cognition and requires mod to max assist with ADLs and min  to mod assist with use of left PW for mobility. She was independent for mobility with use of rollater PTA.CIR recommended due to functional decline.    Review of Systems  Unable to perform ROS: Mental acuity     Past Medical History:  Diagnosis Date   Abdominal discomfort    Chronic N/V/D. Presumptive dx Crohn's dx per elevated p ANCA. Failed Entocort and Pentasa. Sep 2003 - ileocolectomy c anastomosis per Dr Duwaine Gins 2/2 adhesions - path was hegative for Crohns. EGD, Sm bowel follow through (11/03), and an eteroclysis (10/03) were unrevealing. Cuases hypomag and hypocalcemia.   Adnexal mass 10/2001   s/p lap BSO (R ovarian fibroma) & lysis of adhesions   Allergy    Seasonal   Anemia    Multifactorial. Baseline HgB 10-11 ish. B12 def - 150 in 3/10. Fe Def - ferritin 35 3/10. Both are being repleted.   Breast cancer (HCC) 03/16/2013   right, 5 o'clock   CAD (coronary artery disease) 1996   1996 - PTCA and angioplasty diagonal branch. 2000 - Rotoblator & angiopllasty of diagonal. 2006 - subendocardial AMI, DES to proximal LAD.Aaron Aas Also had 90% stenosis in distal apical LAD. EF 55 with apical hypokinesis. Indefinite ASA and Plavix .   CHF (congestive heart failure) (HCC)    Chronic kidney disease, stage 4 (severe) (HCC)    Chronic pain    CT 10/10 = Spinal stenosis L2 - S1.   COVID-19 11/02/2020   Diabetes mellitus    Insulin  dependent   Diabetes mellitus type 2 in obese 05/03/2010   Managed on lantus  and novolog . Has  diabetic nephropathy. Metformin D/C'd 2012 2/2 creatinine.  No diabetic retinopathy per 3/11.    Gout    Hx of radiation therapy 06/02/13- 07/16/13   right rbeast 4500 cGy 25 sessions, right breast boost 1600 cGy in 8 sessions   Hyperlipidemia    Managed with both a statin and Welchol . Welchol  stopped 2014 2/2 cost and started on fenofibrate     Hypertension    2006 B renal arteries patent. 2003 MRA - no RAS. 2003 pheo W/U Dr Francetta Innocent reportedly negative.   Hypoxia 07/23/2017    Lupus    Lymphedema of breast    Personal history of radiation therapy 2015   Pneumonia of left lung due to infectious organism    RBBB    Renal vein thrombosis (HCC)    SBO (small bowel obstruction) (HCC) 09/17/2017   Secondary hyperparathyroidism (HCC)    Stroke (HCC)    Incidental finding MRI 2002 L lacunar infarct   Vitamin B12 deficiency    Vitamin D  deficiency    Wears dentures    top   Past Surgical History:  Procedure Laterality Date   ABDOMINAL HYSTERECTOMY     ARTERY REPAIR Left 07/01/2023   Procedure: REPAIR, ARTERY, BRACHIAL USING VEIN PATCH;  Surgeon: Young Hensen, MD;  Location: [facility] OR;  Service: Vascular;  Laterality: Left;   AVGG REMOVAL Left 07/01/2023   Procedure: REMOVAL OF ARTERIOVENOUS GORETEX GRAFT (AVGG);  Surgeon: Young Hensen, MD;  Location: [facility] OR;  Service: Vascular;  Laterality: Left;  LEFT ARM   BILATERAL SALPINGOOPHORECTOMY  8/03   Lap BSO (R ovarian fibroma) and adhesion lysis   BIOPSY  09/08/2020   Procedure: BIOPSY;  Surgeon: Lanita Pitman, MD;  Location: [facility];  Service: Endoscopy;;   BOWEL RESECTION  2003   ileocolectomy with anastomosis 2/2 adhesions   BREAST BIOPSY Right 2015   BREAST BIOPSY Right 07/2014   BREAST LUMPECTOMY Right 04/22/2013   BREAST LUMPECTOMY WITH NEEDLE LOCALIZATION AND AXILLARY SENTINEL LYMPH NODE BX Right 04/22/2013   Procedure: BREAST LUMPECTOMY WITH NEEDLE LOCALIZATION AND AXILLARY SENTINEL LYMPH NODE BX;  Surgeon: Lockie Rima, MD;  Location: [facility];  Service: General;  Laterality: Right;   CARDIAC CATHETERIZATION     2 stents   CHOLECYSTECTOMY     COLONOSCOPY     COLONOSCOPY N/A 05/30/2023   Procedure: COLONOSCOPY;  Surgeon: Felecia Hopper, MD;  Location: [facility];  Service: Gastroenterology;  Laterality: N/A;   ESOPHAGOGASTRODUODENOSCOPY N/A 09/08/2020   Procedure: ESOPHAGOGASTRODUODENOSCOPY (EGD);  Surgeon: Lanita Pitman, MD;  Location: [facility];  Service:  Endoscopy;  Laterality: N/A;   ESOPHAGOGASTRODUODENOSCOPY N/A 05/30/2023   Procedure: EGD (ESOPHAGOGASTRODUODENOSCOPY);  Surgeon: Felecia Hopper, MD;  Location: [facility] ENDOSCOPY;  Service: Gastroenterology;  Laterality: N/A;   HEMICOLECTOMY     R sided hemicolectomy   HERNIA REPAIR     Ventral hernia repair   INSERTION OF ARTERIOVENOUS (AV) ARTEGRAFT ARM Left 06/21/2023   Procedure: INSERTION, GRAFT, ARTERIOVENOUS, UPPER EXTREMITY;  Surgeon: Adine Hoof, MD;  Location: [facility] OR;  Service: Vascular;  Laterality: Left;   INSERTION OF DIALYSIS CATHETER Right 06/21/2023   Procedure: INSERTION OF DIALYSIS CATHETER;  Surgeon: Adine Hoof, MD;  Location: [facility] OR;  Service: Vascular;  Laterality: Right;   PTCA  4/06    Family History  Problem Relation Age of Onset   Breast cancer Mother 38   Pancreatic cancer Brother 81   Lung cancer Maternal Aunt    Breast cancer Maternal Aunt  Kidney failure Maternal Aunt    Prostate cancer Maternal Uncle    Heart attack Maternal Grandmother    Breast cancer Maternal Grandmother    Colon cancer Maternal Grandmother    Clotting disorder Maternal Grandmother    Diabetes Maternal Grandmother    Hypertension Daughter    Pancreatic cancer Other     Social History:  Family was assisting PTA. She  reports that she has never smoked. She has never used smokeless tobacco. She reports that she does not drink alcohol and does not use drugs.   Allergies  Allergen Reactions   Anesthetics, Halogenated Other (See Comments)    Per patient's daughter, she can not tolerate anesthetics.    Etomidate  Other (See Comments)    Heart stopped during anesthesia   Fentanyl  Other (See Comments)    Heart stopped during anesthesia    Percocet [Oxycodone -Acetaminophen ] Other (See Comments)    Headaches    Diazepam Other (See Comments)    Agitation   Lorazepam  Nausea Only   Tramadol  Hcl Nausea And Vomiting   Haloperidol Nausea And Vomiting     **Haldol**   Morphine  Sulfate Nausea Only    agitation   Propoxyphene Nausea And Vomiting    **Darvocet** no longer available   Versed  [Midazolam ] Other (See Comments)    Heart stopped during anesthesia    Medications Prior to Admission  Medication Sig Dispense Refill   acetaminophen  (TYLENOL ) 650 MG CR tablet Take 1,300 mg by mouth every 8 (eight) hours as needed for pain.     amLODipine  (NORVASC ) 10 MG tablet Take 1 tablet (10 mg total) by mouth daily. 90 tablet 3   apixaban  (ELIQUIS ) 2.5 MG TABS tablet Take 1 tablet (2.5 mg total) by mouth 2 (two) times daily. 60 tablet 0   colestipol  (COLESTID ) 1 g tablet Take 1 tablet (1 g total) by mouth 3 (three) times daily. 90 tablet 2   diphenoxylate -atropine  (LOMOTIL ) 2.5-0.025 MG tablet Take 1 tablet by mouth 2 (two) times daily as needed for diarrhea or loose stools. 30 tablet 1   famotidine  (PEPCID ) 20 MG tablet Take 20 mg by mouth daily as needed for heartburn or indigestion.     FLUoxetine  (PROZAC ) 20 MG capsule Take 20 mg by mouth daily.     folic acid  (FOLVITE ) 1 MG tablet Take 1 tablet (1 mg total) by mouth daily. 30 tablet 2   furosemide  (LASIX ) 40 MG tablet Take 1 tablet (40 mg total) by mouth daily. (Patient taking differently: Take 40-80 mg by mouth See admin instructions. Take 1 tablet by mouth daily and may take an additional tablet for excess swelling) 90 tablet 2   gabapentin  (NEURONTIN ) 300 MG capsule Take 300 mg by mouth at bedtime.     HYDROcodone -acetaminophen  (NORCO/VICODIN) 5-325 MG tablet Take 2 tablets by mouth every 6 (six) hours as needed. 10 tablet 0   lipase/protease/amylase (CREON ) 36000 UNITS CPEP capsule Take 1 capsule (36,000 Units total) by mouth 3 (three) times daily before meals. 180 capsule 2   loperamide  (IMODIUM ) 2 MG capsule Take 1 capsule (2 mg total) by mouth as needed for diarrhea or loose stools. 30 capsule 0   mirtazapine  (REMERON ) 7.5 MG tablet Take 7.5 mg by mouth at bedtime.     pantoprazole   (PROTONIX ) 40 MG tablet Take 1 tablet (40 mg total) by mouth 2 (two) times daily. (Patient taking differently: Take 40 mg by mouth daily.) 30 tablet 0   rivastigmine  (EXELON ) 1.5 MG capsule Take 1 capsule (1.5  mg total) by mouth 2 (two) times daily. 60 capsule 6   [EXPIRED] sevelamer  carbonate (RENVELA ) 800 MG tablet Take 1 tablet (800 mg total) by mouth 3 (three) times daily with meals. 90 tablet 0   [EXPIRED] sodium bicarbonate  650 MG tablet Take 1 tablet (650 mg total) by mouth 2 (two) times daily. 60 tablet 0   tiZANidine  (ZANAFLEX ) 2 MG tablet Take 2 mg by mouth at bedtime as needed for muscle spasms.     nitroGLYCERIN  (NITROSTAT ) 0.4 MG SL tablet DISSOLVE 1 TABLET UNDER THE TONGUE EVERY 5 MINUTES AS  NEEDED FOR CHEST PAIN. MAX  OF 3 TABLETS IN 15 MINUTES. CALL 911 IF PAIN PERSISTS. (Patient not taking: Reported on 06/15/2023) 25 tablet 0      Home: Home Living Family/patient expects to be discharged to:: Private residence Living Arrangements:  (lives with sister in law, Valeda Garter) Available Help at Discharge: Family, Available 24 hours/day Type of Home: House Home Access: Level entry Home Layout: Two level, 1/2 bath on main level, Able to live on main level with bedroom/bathroom Alternate Level Stairs-Number of Steps: 12 Alternate Level Stairs-Rails: Right Bathroom Shower/Tub: Engineer, manufacturing systems: Standard Bathroom Accessibility: Yes Home Equipment: Agricultural consultant (2 wheels), Rollator (4 wheels), Cane - single point Additional Comments: information obtained from chart, patient confused on eval  Lives With: Family   Functional History: Prior Function Prior Level of Function : Needs assist Mobility Comments: Rollator or RW ADLs Comments: had assist for medications, driving, was to get grabbar and shower chair after last stay but never delivered per grandaughter in the room  Functional Status:  Mobility: Bed Mobility Overal bed mobility: Needs Assistance Bed  Mobility: Supine to Sit Sidelying to sit: Mod assist Supine to sit: Mod assist General bed mobility comments: Mod A to assist pt with BLE mobility and raise trunk Transfers Overall transfer level: Needs assistance Equipment used: Left platform walker Transfers: Sit to/from Stand, Bed to chair/wheelchair/BSC Sit to Stand: Max assist Bed to/from chair/wheelchair/BSC transfer type:: Step pivot Step pivot transfers: Mod assist General transfer comment: Max A to rise into standing from EOB, Mod A to sequence and take steps while maintaining RW. Ambulation/Gait Ambulation/Gait assistance: Min assist, Mod assist Gait Distance (Feet): 12 Feet (&8') Assistive device: Left platform walker Gait Pattern/deviations: Step-to pattern, Decreased stride length, Trunk flexed, Shuffle, Wide base of support General Gait Details: more difficulty progressing R LE than L and increased time with cues for walking forward to end of the bed, then noted she needed to toilet so turned with A for walker, balance and stepping to get to bathroom, after toileted walked to recliner    ADL: ADL Overall ADL's : Needs assistance/impaired Eating/Feeding: Sitting, Set up, Bed level Grooming: Wash/dry hands, Minimal assistance, Standing Grooming Details (indicate cue type and reason): min A to allow for assist with setting up soap Upper Body Bathing: Sitting, Contact guard assist Upper Body Bathing Details (indicate cue type and reason): UE support Lower Body Bathing: Sitting/lateral leans, Minimal assistance Lower Body Bathing Details (indicate cue type and reason): UE support Upper Body Dressing : Sitting, Minimal assistance Lower Body Dressing: Sitting/lateral leans, Moderate assistance Toilet Transfer: Rolling walker (2 wheels), Moderate assistance, Stand-pivot Toileting- Clothing Manipulation and Hygiene: Sit to/from stand, Contact guard assist Toileting - Clothing Manipulation Details (indicate cue type and reason):  pt wiping self while seated on toilet. Functional mobility during ADLs: Moderate assistance, Rolling walker (2 wheels) General ADL Comments: Reinforced education of CHF diet and exacerbation  prevention. Pt negotiated 2 steps with CGA, eduacted pt to go up the stairs facing rail and using it as support  Cognition: Cognition Orientation Level: Oriented to person, Disoriented to place, Disoriented to time, Disoriented to situation Cognition Arousal: Alert Behavior During Therapy: WFL for tasks assessed/performed  Physical Exam: Blood pressure (!) 133/57, pulse 85, temperature 98.4 F (36.9 C), temperature source Oral, resp. rate 18, height (P) 5\' 4"  (1.626 m), weight 75.3 kg, SpO2 99%. Physical Exam Vitals and nursing note reviewed.  Constitutional:      General: She is not in acute distress.    Appearance: She is not ill-appearing.  HENT:     Head: Normocephalic.     Mouth/Throat:     Mouth: Mucous membranes are moist.  Eyes:     Pupils: Pupils are equal, round, and reactive to light.  Cardiovascular:     Rate and Rhythm: Regular rhythm. Tachycardia present.     Pulses: Normal pulses.  Pulmonary:     Effort: Pulmonary effort is normal. No respiratory distress.     Breath sounds: No wheezing.  Abdominal:     General: Bowel sounds are normal. There is no distension.     Tenderness: There is no abdominal tenderness.  Musculoskeletal:        General: Swelling and tenderness (left antecubital region) present.     Cervical back: Normal range of motion.  Skin:    Comments: AVF site CDI. No erythema. Right heel boggy  Neurological:     Mental Status: She is alert.     Comments: Oriented to self only. Unable to recall age, DOB or place. Poor recall and decrease insight into deficits. She was able to follow simple motor commands. No CN deficits. Limited RUE d/t HD catheter. LUE limited d/t pain. Grip/wrist at least 3-4/5 bilaterally. BLE 3- to 3/5 HF, KE and 4/5 ADF/PF. Decreased LT from  ankles down bilaterally. No abnl resting tone. DTR's tr to 1+   Psychiatric:     Comments: Pt flat, cooperative     Results for orders placed or performed during the hospital encounter of 06/15/23 (from the past 48 hours)  Glucose, capillary     Status: Abnormal   Collection Time: 07/06/23  5:44 PM  Result Value Ref Range   Glucose-Capillary 213 (H) 70 - 99 mg/dL    Comment: Glucose reference range applies only to samples taken after fasting for at least 8 hours.  Glucose, capillary     Status: Abnormal   Collection Time: 07/07/23  7:44 AM  Result Value Ref Range   Glucose-Capillary 126 (H) 70 - 99 mg/dL    Comment: Glucose reference range applies only to samples taken after fasting for at least 8 hours.  Renal function panel     Status: Abnormal   Collection Time: 07/07/23  7:59 AM  Result Value Ref Range   Sodium 135 135 - 145 mmol/L   Potassium 4.6 3.5 - 5.1 mmol/L   Chloride 97 (L) 98 - 111 mmol/L   CO2 19 (L) 22 - 32 mmol/L   Glucose, Bld 110 (H) 70 - 99 mg/dL    Comment: Glucose reference range applies only to samples taken after fasting for at least 8 hours.   BUN 58 (H) 8 - 23 mg/dL   Creatinine, Ser 4.09 (H) 0.44 - 1.00 mg/dL   Calcium  9.4 8.9 - 10.3 mg/dL   Phosphorus 7.1 (H) 2.5 - 4.6 mg/dL   Albumin  2.1 (L) 3.5 - 5.0 g/dL  GFR, Estimated 7 (L) >60 mL/min    Comment: (NOTE) Calculated using the CKD-EPI Creatinine Equation (2021)    Anion gap 19 (H) 5 - 15    Comment: Performed at [facility], [address], [place], [place] [address]  CBC with Differential/Platelet     Status: Abnormal   Collection Time: 07/07/23  7:59 AM  Result Value Ref Range   WBC 11.2 (H) 4.0 - 10.5 K/uL   RBC 2.98 (L) 3.87 - 5.11 MIL/uL   Hemoglobin 8.5 (L) 12.0 - 15.0 g/dL   HCT 19.1 (L) 47.8 - 29.5 %   MCV 94.0 80.0 - 100.0 fL   MCH 28.5 26.0 - 34.0 pg   MCHC 30.4 30.0 - 36.0 g/dL   RDW 62.1 (H) 30.8 - 65.7 %   Platelets 276 150 - 400 K/uL   nRBC 0.2 0.0 - 0.2 %    Neutrophils Relative % 79 %   Neutro Abs 8.8 (H) 1.7 - 7.7 K/uL   Lymphocytes Relative 13 %   Lymphs Abs 1.4 0.7 - 4.0 K/uL   Monocytes Relative 7 %   Monocytes Absolute 0.8 0.1 - 1.0 K/uL   Eosinophils Relative 0 %   Eosinophils Absolute 0.0 0.0 - 0.5 K/uL   Basophils Relative 0 %   Basophils Absolute 0.0 0.0 - 0.1 K/uL   Immature Granulocytes 1 %   Abs Immature Granulocytes 0.15 (H) 0.00 - 0.07 K/uL    Comment: Performed at [facility], [address] [place], [place] [address]  Vancomycin , random     Status: None   Collection Time: 07/07/23  7:59 AM  Result Value Ref Range   Vancomycin  Rm 29 ug/mL    Comment:        Random Vancomycin  therapeutic range is dependent on dosage and time of specimen collection. A peak range is 20-40 ug/mL A trough range is 5-15 ug/mL        Performed at [facility], [address], [place], [place] [address]   Glucose, capillary     Status: Abnormal   Collection Time: 07/07/23 11:29 AM  Result Value Ref Range   Glucose-Capillary 148 (H) 70 - 99 mg/dL    Comment: Glucose reference range applies only to samples taken after fasting for at least 8 hours.  Glucose, capillary     Status: Abnormal   Collection Time: 07/07/23  4:50 PM  Result Value Ref Range   Glucose-Capillary 153 (H) 70 - 99 mg/dL    Comment: Glucose reference range applies only to samples taken after fasting for at least 8 hours.  Glucose, capillary     Status: Abnormal   Collection Time: 07/07/23  9:09 PM  Result Value Ref Range   Glucose-Capillary 151 (H) 70 - 99 mg/dL    Comment: Glucose reference range applies only to samples taken after fasting for at least 8 hours.  Renal function panel     Status: Abnormal   Collection Time: 07/08/23  5:19 AM  Result Value Ref Range   Sodium 132 (L) 135 - 145 mmol/L   Potassium 4.4 3.5 - 5.1 mmol/L   Chloride 93 (L) 98 - 111 mmol/L   CO2 19 (L) 22 - 32 mmol/L   Glucose, Bld 107 (H) 70 - 99 mg/dL    Comment: Glucose  reference range applies only to samples taken after fasting for at least 8 hours.   BUN 69 (H) 8 - 23 mg/dL   Creatinine, Ser  6.54 (H) 0.44 - 1.00 mg/dL   Calcium  8.8 (L) 8.9 - 10.3 mg/dL   Phosphorus 7.5 (H) 2.5 - 4.6 mg/dL   Albumin  1.9 (L) 3.5 - 5.0 g/dL   GFR, Estimated 6 (L) >60 mL/min    Comment: (NOTE) Calculated using the CKD-EPI Creatinine Equation (2021)    Anion gap 20 (H) 5 - 15    Comment: Performed at [facility], [address] [place], [place] [address]  CBC with Differential/Platelet     Status: Abnormal   Collection Time: 07/08/23  5:19 AM  Result Value Ref Range   WBC 10.5 4.0 - 10.5 K/uL   RBC 2.77 (L) 3.87 - 5.11 MIL/uL   Hemoglobin 8.0 (L) 12.0 - 15.0 g/dL   HCT 19.1 (L) 47.8 - 29.5 %   MCV 92.1 80.0 - 100.0 fL   MCH 28.9 26.0 - 34.0 pg   MCHC 31.4 30.0 - 36.0 g/dL   RDW 62.1 (H) 30.8 - 65.7 %   Platelets 296 150 - 400 K/uL   nRBC 0.2 0.0 - 0.2 %   Neutrophils Relative % 80 %   Neutro Abs 8.5 (H) 1.7 - 7.7 K/uL   Lymphocytes Relative 12 %   Lymphs Abs 1.2 0.7 - 4.0 K/uL   Monocytes Relative 6 %   Monocytes Absolute 0.6 0.1 - 1.0 K/uL   Eosinophils Relative 1 %   Eosinophils Absolute 0.1 0.0 - 0.5 K/uL   Basophils Relative 0 %   Basophils Absolute 0.0 0.0 - 0.1 K/uL   Immature Granulocytes 1 %   Abs Immature Granulocytes 0.13 (H) 0.00 - 0.07 K/uL    Comment: Performed at [facility], [address], [place], [place] [address]  Glucose, capillary     Status: Abnormal   Collection Time: 07/08/23  1:12 PM  Result Value Ref Range   Glucose-Capillary 128 (H) 70 - 99 mg/dL    Comment: Glucose reference range applies only to samples taken after fasting for at least 8 hours.   *Note: Due to a large number of results and/or encounters for the requested time period, some results have not been displayed. A complete set of results can be found in Results Review.   MR ELBOW LEFT WO CONTRAST Result Date: 07/07/2023 CLINICAL DATA:  Suspected local  infection. EXAM: MRI OF THE LEFT ELBOW WITHOUT CONTRAST TECHNIQUE: Multiplanar, multisequence MR imaging of the elbow was performed. No intravenous contrast was administered. COMPARISON:  None Available. FINDINGS: TENDONS Common forearm flexor origin: Mild to moderate intermediate T2 signal tendinosis of the common flexor tendon origin at the medial epicondyle. Common forearm extensor origin: Mild intermediate T2 signal tendinosis at the proximal deep aspect of the common extensor tendon origin at the lateral epicondyle. Biceps: There is mild fluid at the volar and medial/ulnar aspects of a distal biceps muscle (axial series 12, image 14) and minimal fluid around the biceps musculotendinous junction (axial images 24 through 26), however the tendon otherwise appears intact. There is heterogeneous T2 and decreased T1 signal seen just medial to the distal volar aspect of the biceps muscle, just medial/ulnar/volar to the median antecubital vein and the median nerve. Question intervention in this region such as IV placement, needlestick, or other. This region of heterogeneity and possibly inflammatory phlegmon measures up to approximately 2 cm in transverse dimension, 1.7 cm in AP dimension, and 3 cm in craniocaudal dimension (axial series 4, image 23, sagittal series 11, image 13). There is a small curvilinear region  of fluid signal measuring up to approximately 1.8 x 0.5 x 1.5 cm (transverse by AP by craniocaudal) in this region, just anterior to the muscle fascia at the proximal biceps musculotendinous junction. The brachialis tendon is intact. Triceps: The triceps tendon insertion is intact. LIGAMENTS Medial stabilizers: The ulnar collateral ligament is intact. Lateral stabilizers: The radial collateral ligament and radioulnar collateral ligament are intact. Cartilage: Possible mild degenerative thinning of the medial elbow cartilage at the medial aspect of the trochlea and coronoid. Mild thinning of the lateral  aspect of the capitellar cartilage (coronal series 6, image 12). Joint: Mild-to-moderate joint effusion. No significant synovial thickening. Cubital tunnel: Normal appearance of the ulnar nerve. Normal course and signal of the radial and median nerves. Bones: No acute fracture.  No cortical erosion.  No marrow edema. IMPRESSION: 1. There is heterogeneous T2 and decreased T1 signal seen just medial to the distal volar aspect of the biceps muscle, just medial/ulnar/volar to the median antecubital vein and the median nerve. Question intervention in this region such as IV placement, needlestick, or other, recommend clinical correlation. Extending laterally/radially from this region of possibly inflammatory phlegmon is a small curvilinear region of fluid measuring up to approximately 1.8 x 0.5 x 1.5 cm, just anterior to the muscle at the biceps musculotendinous junction. This may represent a small developing abscess. 2. Mild-to-moderate elbow joint effusion. No significant synovial thickening. MRI cannot determine the presence or absence of infection in this joint fluid. 3. Mild-to-moderate common flexor tendinosis. 4. Mild common extensor tendinosis. Electronically Signed   By: Bertina Broccoli M.D.   On: 07/07/2023 11:05      Blood pressure (!) 133/57, pulse 85, temperature 98.4 F (36.9 C), temperature source Oral, resp. rate 18, height (P) 5\' 4"  (1.626 m), weight 75.3 kg, SpO2 99%.  Medical Problem List and Plan: 1. Functional deficits secondary to debility after acute respiratory failure and multiple medical issues  -patient may not yet shower  -ELOS/Goals: 10-14 days, supervision goals with PT, OT, SLP 2.  Antithrombotics: -DVT/anticoagulation:  Pharmaceutical: Eliquis   -antiplatelet therapy: N/A 3. Acute on chronic LBP/Pain Management: Continue Hydrocodone  prn severe pain--monitor for sedation/confusion.   --Was on IV dilaudid  till 04/16-->now on hydrocodone  10 TID on average.   --increased use of  Zanaflex  in past 48 hours.   --continue Gabapentin  300 mg/hs 4. Hx of depression/anxiety/Sleep: LCSW to follow for evaluation and support  --melatonin prn for insomnia.   -antipsychotic agents: N/A  -pt on chronic prozac  and remeron  5. Neuropsych/cognition: This patient is not capable of making decisions on her own behalf. 6. Skin/Wound Care: Routine pressure relief measures.   -float both heels 7. Fluids/Electrolytes/Nutrition: Strict I/O. Labs with HD  --1200 cc FR/day.  8. Recurrent fevers/Possible abscess LUE: On Van since 04/19 and Zosyn  since 04/17  --WBC trending down to 10.5 today. BC X 2 from 04/19 pending--negative so far.   --has been afebrile X 24 hours with recommendations to change Zosyn  to Augmentin  by Dr. Sunnie England.   -pt reports that arm is feeling less tender 9. H/o GIB: Monitor H/H as had further drop. Hgb down to  On IV iron /ESA 10. HTN: Monitor BP TID--on Torsemide  on non dialysis days.  11. Steal syndrome Left hand: AV graft removed on 04/15 12. H/o Gastritis/esophagitis: Most recent admission last month. Continue PPI BID.  --Has required one unit PRBC 04/16.   --monitor for signs of bleeding.  13. Chronic diarrhea:  Managed with Colestid  and Creon  tid ac.  14. H/o cognitive/memory  deficits: On Exelon  bid.   15. ESRD: Now HD dependent--TTS at the end of the day to help with tolerance of therapy  --on renveal for metabolic bone disease.   17. T2DM: Hgb A1c- 6.2 and diet controlled. Add CM restrictions.  --Will monitor BS ac/hs and use SSI for elevated BS.     Zelda Hickman, PA-C 07/08/2023

## 2023-07-08 NOTE — Discharge Summary (Addendum)
 Physician Discharge Summary   Patient: [name] MRN: [id number] DOB: 1945/09/23  Admit date:     06/15/2023  Discharge date: 07/08/23  Discharge Physician: Albertus Alt   PCP: Merl Star, MD   Recommendations at discharge:    Patient will continue antibiotic therapy with IV vancomycin  re dose on HD per pharmacy protocol and oral Augmentin  stop date 07/16/23  Follow up with vascular surgery as outpatient.  Follow up with Dr Joice Nares 7 to 10 days post discharge from inpatient rehab.  I spoke with patient's daughter at the bedside, we talked in detail about patient's condition, plan of care and prognosis and all questions were addressed.   Discharge Diagnoses: Principal Problem:   Acute on chronic diastolic CHF (congestive heart failure) (HCC) Active Problems:   Chronic kidney disease (CKD), stage V (HCC)   Essential hypertension   Acute on chronic anemia   GERD (gastroesophageal reflux disease)   Type 2 diabetes mellitus (HCC)   Gout flare   CAD (coronary artery disease)   Depression   Breast cancer of lower-inner quadrant of right female breast (HCC)  Resolved Problems:   * No resolved hospital problems. [facility] Course: [name] was admitted to the hospital with the working diagnosis of heart failure exacerbation in the setting of progressive renal disease.   78 yo female with the past medical history of diastolic heart failure, CKD 5, history of DVT, renal vein thrombosis, depression, type 2 diabetes mellitus, history of breast cancer, and systemic erythematous lupus who presented with dyspnea.  Recent hospitalization for volume overload due to heart failure 03/08 to 03/15, she was diuresed and discharged home.  Unfortunately at home she had progressive dyspnea on exertion, bilateral lower extremity edema and rapid weight gain (20 lbs since her discharge from the hospital).  On her initial physical examination her blood pressure was 160/70, HR 94  RR 17 and 02 saturation 97%. Lungs with no wheezing or rhonchi, heart with S1 and S2 present and regular with no gallops, rubs or murmurs, respiratory with no wheezing or rhonchi, abdomen with no distention and positive pitting bilateral lower extremity edema   Na 140, K 5.0 CL 109, bicarbonate 20, glucose 147 bun 82 cr 4,0  BNP 560  High sensitive troponin 125 and 117  Wbc 11,4 hgb 7,0 plt 192   Chest radiograph with hypoinflation, with mild cardiomegaly, bilateral hilar vascular congestion with cephalization of the vasculature and small bilateral pleural effusions.   EKG 88 bpm, normal axis, right bundle branch block, sinus rhythm with no significant ST segment or T wave changes.   Patient was placed on IV furosemide  for diuresis, and then transitioned to oral loop diuretics.  Her renal function continue to worsened and decision was made to start patient on renal replacement therapy.   04/04 patient had right internal jugular tunneled HD catheter placed and left upper extremity fistula placed.  04/05 HD with good toleration.  04/06 improved volume status.   04/07 discharge has held due to low diastolic pressure. She developed signs of cellulitis on her left upper extremity fistula site.  04/08 started on antibiotic therapy.  4/9-10 onwards, persistent severe left arm pain 4/12 vascular surgery consulted, plan for AV graft excision 4/14 s/p excision of AV graft  4/15 new leukocytosis, body aches, temp 99.0 fever workup initiated, started empirically on Vanco and Zosyn  04/16 hgb 6,6 ordered one unit PRBC transfusion, HD today.  04/17 discontinue antibiotic therapy 04/18 changed from IV heparin  to  apixaban .  04/19 patient with recurrent fever and now worsening leukocytosis, resumed antibiotic therapy with IV vancomycin  and Zosyn , check left upper extremity MRI.   04/20 left upper extremity MRI with local inflammation possible small developing abscess.  04/21 patient has been afebrile for  the last 24 hrs and no further leukocytosis. Vascular surgery evaluation recommended continue antibiotic therapy.  Patient will be transferred to CIR today.   Assessment and Plan: * Acute on chronic diastolic CHF (congestive heart failure) (HCC) Echocardiogram with preserved LV systolic function with EF 60 to 65%, moderate LVH, RV systolic function preserved, mild mitral valve regurgitation, mid tricuspid valve regurgitation, RVSP 33,9 mmHg, trivial pericardial effusion,    Volume status has improved.  Continue ultrafiltration for volume management.   Limited medical therapy due to risk of hypotension.   Chronic kidney disease (CKD), stage V (HCC) Progressed to ESRD on renal replacement therapy per HD  Left upper extremity fistula has been removed  Has right internal jugular tunneled HD cathter in place.  Torsemide  on non HD days, 100 mg   Anemia of chronic renal disease.  SP EPO and IV iron   Hgb 8.0   Metabolic bone disease, continue with sevelamer .   History of renal vein thrombosis in 2019 and deep vein thrombosis 2022  Anticoagulation with apixaban .   Left upper extremity with clinical signs of acute cellulitis resolving Fistula has been removed 04/13 OP note from fistula excision with no signs of local infection.  Stopped antibiotics 04/17.  Resumed antibiotics 04/19   With antibiotics improvement in fever and leukocytosis.   Left upper extremity MRI, noted extending laterally radially, from site of removed fistula region of inflammatory phlegmon, small curvilinear region of fluid measuring up to 1,8, 0,5 and 1,5 cm just anterior to the muscle at the biceps musculotendinous junction. May represent small developing abscess.  Case discussed with vascular surgery will continue antibiotic therapy, no indication for surgical intervention.   Continue with antibiotic therapy with Vancomycin  and transition from Zosyn  to Augmentin  to stop on 07/16/23  Discharge wbc is 10,5 and she  has been afebrile for 24 hrs.  Follow up as outpatient.    Essential hypertension Continue fluid management per HD   Acute on chronic anemia Acute on chronic anemia of chronic renal disease.  03./30 PRBC transfusion x 1 04/16 PRBC transfusion x1   GERD (gastroesophageal reflux disease) Continue antiacid therapy with pantoprazole .   Type 2 diabetes mellitus (HCC) Patient was placed on insulin  sliding scale for glucose cover and monitoring. Her glucose remained well controlled.    Gout flare Patient was treated with 3 days of prednisone  with good toleration   CAD (coronary artery disease) SP PCI in 2000 No chest pain, no acute coronary syndrome   Depression Continue with rivastigmine , mirtazapine  and fluoxetine    Breast cancer of lower-inner quadrant of right female breast (HCC) Follow up as outpatient.        Consultants: nephrology, vascular surgery, Procedures performed: left upper extremity fistula creation and then removal,   Disposition: Rehabilitation facility Diet recommendation:  Discharge Diet Orders (From admission, onward)     Start     Ordered   06/24/23 0000  Diet - low sodium heart healthy        06/24/23 1448           Cardiac and Carb modified diet DISCHARGE MEDICATION: Allergies as of 07/08/2023       Reactions   Anesthetics, Halogenated Other (See Comments)   Per patient's daughter,  she can not tolerate anesthetics.    Etomidate  Other (See Comments)   Heart stopped during anesthesia   Fentanyl  Other (See Comments)   Heart stopped during anesthesia   Percocet [oxycodone -acetaminophen ] Other (See Comments)   Headaches   Diazepam Other (See Comments)   Agitation   Lorazepam  Nausea Only   Tramadol  Hcl Nausea And Vomiting   Haloperidol Nausea And Vomiting   **Haldol**   Morphine  Sulfate Nausea Only   agitation   Propoxyphene Nausea And Vomiting   **Darvocet** no longer available   Versed  [midazolam ] Other (See Comments)   Heart  stopped during anesthesia        Medication List     STOP taking these medications    acetaminophen  650 MG CR tablet Commonly known as: TYLENOL  Replaced by: acetaminophen  325 MG tablet   amLODipine  10 MG tablet Commonly known as: NORVASC    diphenoxylate -atropine  2.5-0.025 MG tablet Commonly known as: Lomotil    folic acid  1 MG tablet Commonly known as: FOLVITE    furosemide  40 MG tablet Commonly known as: LASIX    loperamide  2 MG capsule Commonly known as: IMODIUM    nitroGLYCERIN  0.4 MG SL tablet Commonly known as: NITROSTAT    sodium bicarbonate  650 MG tablet       TAKE these medications    acetaminophen  325 MG tablet Commonly known as: TYLENOL  Take 2 tablets (650 mg total) by mouth every 6 (six) hours as needed for mild pain (pain score 1-3), headache or fever. Replaces: acetaminophen  650 MG CR tablet   amoxicillin -clavulanate 500-125 MG tablet Commonly known as: AUGMENTIN  Take 1 tablet by mouth daily for 10 days.   apixaban  5 MG Tabs tablet Commonly known as: ELIQUIS  Take 1 tablet (5 mg total) by mouth 2 (two) times daily. What changed:  medication strength how much to take   colestipol  1 g tablet Commonly known as: COLESTID  Take 1 tablet (1 g total) by mouth 3 (three) times daily.   famotidine  20 MG tablet Commonly known as: PEPCID  Take 20 mg by mouth daily as needed for heartburn or indigestion.   FLUoxetine  20 MG capsule Commonly known as: PROZAC  Take 20 mg by mouth daily.   gabapentin  300 MG capsule Commonly known as: NEURONTIN  Take 300 mg by mouth at bedtime.   HYDROcodone -acetaminophen  5-325 MG tablet Commonly known as: NORCO/VICODIN Take 1 tablet by mouth every 8 (eight) hours as needed for severe pain (pain score 7-10). What changed:  how much to take when to take this reasons to take this   lipase/protease/amylase 16109 UNITS Cpep capsule Commonly known as: CREON  Take 1 capsule (36,000 Units total) by mouth 3 (three) times daily  before meals.   mirtazapine  7.5 MG tablet Commonly known as: REMERON  Take 7.5 mg by mouth at bedtime.   pantoprazole  40 MG tablet Commonly known as: PROTONIX  Take 1 tablet (40 mg total) by mouth 2 (two) times daily. What changed: when to take this   rivastigmine  1.5 MG capsule Commonly known as: EXELON  Take 1 capsule (1.5 mg total) by mouth 2 (two) times daily.   sevelamer  carbonate 800 MG tablet Commonly known as: RENVELA  Take 2 tablets (1,600 mg total) by mouth 3 (three) times daily with meals.   tiZANidine  2 MG tablet Commonly known as: ZANAFLEX  Take 2 mg by mouth at bedtime as needed for muscle spasms.   torsemide  100 MG tablet Commonly known as: DEMADEX  Take on non hemodialysis days, take on Wednesday, Saturday and Friday.   vancomycin  IVPB Inject 750 mg into the vein Every  Tuesday,Thursday,and Saturday with dialysis. Indication:  Cellulitis First Dose: Yes Last Day of Therapy:  07/16/23 Start taking on: July 09, 2023       ASK your doctor about these medications    sevelamer  carbonate 800 MG tablet Commonly known as: RENVELA  Take 1 tablet (800 mg total) by mouth 3 (three) times daily with meals. Ask about: Should I take this medication?               Home Infusion Instuctions  (From admission, onward)           Start     Ordered   07/08/23 0000  Home infusion instructions       Question Answer Comment  Instructions Other   Comments patient will get antibiotic therapy on hemodialysis      07/08/23 1324            Follow-up Information     Merl Star, MD. Go on 06/27/2023.   Specialty: Internal Medicine Contact information: [address] [place] [address] [phone]         [facility]. Go on 07/01/2023.   Why: Transitional Care Unit- Monday, Tuesday, Thursday, Friday with 11:15 am chair  time.  On the first day, please arrive at 10:30 am to complete paperwork prior to treatment. Contact  information: [address] [place] [address] [phone]         [facility] Follow up in 3 week(s).   Specialty: Vascular Surgery Why: Office will call to arrange your appt(s) (sent) Contact information: [address] [place]  [address] [phone]               Discharge Exam: Filed Weights   07/06/23 0400 07/08/23 0500 07/08/23 0815  Weight: 71.7 kg 73.2 kg 75.3 kg   BP (!) 100/59   Pulse 85   Temp 98.9 F (37.2 C)   Resp 12   Ht (P) 5\' 4"  (1.626 m)   Wt 75.3 kg   SpO2 100%   BMI (P) 28.49 kg/m   Patient is feeling better, left upper extremity with improved pain and mobility, she has intermittent back pain, continue very weak and deconditioned not yet back to her baseline  Neurology awake and alert ENT with mild pallor Cardiovascular with S1 and S2 present and regular with no gallops or rubs Respiratory with no rales or wheezing, no rhonchi  Abdomen with no distention  No lower extremity edema  Left upper extremity with no local erythema, surgical wound is clean with no drainage.   Condition at discharge: stable  The results of significant diagnostics from this hospitalization (including imaging, microbiology, ancillary and laboratory) are listed below for reference.   Imaging Studies: MR ELBOW LEFT WO CONTRAST Result Date: 07/07/2023 CLINICAL DATA:  Suspected local infection. EXAM: MRI OF THE LEFT ELBOW WITHOUT CONTRAST TECHNIQUE: Multiplanar, multisequence MR imaging of the elbow was performed. No intravenous contrast was administered. COMPARISON:  None Available. FINDINGS: TENDONS Common forearm flexor origin: Mild to moderate intermediate T2 signal tendinosis of the common flexor tendon origin at the medial epicondyle. Common forearm extensor origin: Mild intermediate T2 signal tendinosis at the proximal deep aspect of the common extensor tendon origin at the lateral epicondyle. Biceps:  There is mild fluid at the volar and medial/ulnar aspects of a distal biceps muscle (axial series 12, image 14) and minimal fluid around the biceps musculotendinous junction (axial images 24 through 26), however the tendon  otherwise appears intact. There is heterogeneous T2 and decreased T1 signal seen just medial to the distal volar aspect of the biceps muscle, just medial/ulnar/volar to the median antecubital vein and the median nerve. Question intervention in this region such as IV placement, needlestick, or other. This region of heterogeneity and possibly inflammatory phlegmon measures up to approximately 2 cm in transverse dimension, 1.7 cm in AP dimension, and 3 cm in craniocaudal dimension (axial series 4, image 23, sagittal series 11, image 13). There is a small curvilinear region of fluid signal measuring up to approximately 1.8 x 0.5 x 1.5 cm (transverse by AP by craniocaudal) in this region, just anterior to the muscle fascia at the proximal biceps musculotendinous junction. The brachialis tendon is intact. Triceps: The triceps tendon insertion is intact. LIGAMENTS Medial stabilizers: The ulnar collateral ligament is intact. Lateral stabilizers: The radial collateral ligament and radioulnar collateral ligament are intact. Cartilage: Possible mild degenerative thinning of the medial elbow cartilage at the medial aspect of the trochlea and coronoid. Mild thinning of the lateral aspect of the capitellar cartilage (coronal series 6, image 12). Joint: Mild-to-moderate joint effusion. No significant synovial thickening. Cubital tunnel: Normal appearance of the ulnar nerve. Normal course and signal of the radial and median nerves. Bones: No acute fracture.  No cortical erosion.  No marrow edema. IMPRESSION: 1. There is heterogeneous T2 and decreased T1 signal seen just medial to the distal volar aspect of the biceps muscle, just medial/ulnar/volar to the median antecubital vein and the median nerve. Question  intervention in this region such as IV placement, needlestick, or other, recommend clinical correlation. Extending laterally/radially from this region of possibly inflammatory phlegmon is a small curvilinear region of fluid measuring up to approximately 1.8 x 0.5 x 1.5 cm, just anterior to the muscle at the biceps musculotendinous junction. This may represent a small developing abscess. 2. Mild-to-moderate elbow joint effusion. No significant synovial thickening. MRI cannot determine the presence or absence of infection in this joint fluid. 3. Mild-to-moderate common flexor tendinosis. 4. Mild common extensor tendinosis. Electronically Signed   By: Bertina Broccoli M.D.   On: 07/07/2023 11:05   DG CHEST PORT 1 VIEW Result Date: 07/02/2023 CLINICAL DATA:  Cough, leukocytosis EXAM: PORTABLE CHEST 1 VIEW COMPARISON:  06/22/2023 FINDINGS: Right dialysis catheter tip in the SVC. Heart and mediastinal contours are within normal limits. No focal opacities or effusions. No acute bony abnormality. Lucency in the left axilla soft tissues, question gas. IMPRESSION: Question gas in the left axillary soft tissues. No acute cardiopulmonary disease. Electronically Signed   By: Janeece Mechanic M.D.   On: 07/02/2023 17:46   DG Chest Port 1 View Result Date: 06/22/2023 CLINICAL DATA:  Dialysis catheter insertion EXAM: PORTABLE CHEST 1 VIEW COMPARISON:  06/15/2023 FINDINGS: Right internal jugular dialysis catheter has been placed with the tip at the cavoatrial junction. No pneumothorax. Heart upper limits normal in size. No confluent airspace opacities or effusions. No acute bony abnormality. IMPRESSION: Right dialysis catheter tip at the cavoatrial junction. No pneumothorax. No active disease. Electronically Signed   By: Janeece Mechanic M.D.   On: 06/22/2023 10:07   DG C-Arm 1-60 Min-No Report Result Date: 06/21/2023 Fluoroscopy was utilized by the requesting physician.  No radiographic interpretation.   VAS US  UPPER EXT VEIN MAPPING  (PRE-OP  AVF) Result Date: 06/19/2023 UPPER EXTREMITY VEIN MAPPING Patient Name:  [name]  Date of Exam:   06/19/2023 Medical Rec #: [id number]  Accession #:    [id number] Date of Birth: 17-Oct-1945           Patient Gender: F Patient Age:   13 years Exam Location:  [facility] Procedure:      VAS US  UPPER EXT VEIN MAPPING (PRE-OP  AVF) Referring Phys: Adrian Alba --------------------------------------------------------------------------------  Indications: Pre-access. Comparison Study: None. Performing Technologist: Estanislao Heimlich  Examination Guidelines: A complete evaluation includes B-mode imaging, spectral Doppler, color Doppler, and power Doppler as needed of all accessible portions of each vessel. Bilateral testing is considered an integral part of a complete examination. Limited examinations for reoccurring indications may be performed as noted. +-----------------+-------------+----------+--------------+ Right Cephalic   Diameter (cm)Depth (cm)   Findings    +-----------------+-------------+----------+--------------+ Shoulder             0.25        1.22                  +-----------------+-------------+----------+--------------+ Prox upper arm       0.22        0.87     branching    +-----------------+-------------+----------+--------------+ Mid upper arm        0.22        0.45                  +-----------------+-------------+----------+--------------+ Dist upper arm       0.15        0.34                  +-----------------+-------------+----------+--------------+ Antecubital fossa    0.26        0.27                  +-----------------+-------------+----------+--------------+ Prox forearm         0.18        0.25                  +-----------------+-------------+----------+--------------+ Mid forearm          0.07        0.24                  +-----------------+-------------+----------+--------------+ Dist forearm                             not visualized +-----------------+-------------+----------+--------------+ Wrist                                   not visualized +-----------------+-------------+----------+--------------+ +-----------------+-------------+----------+--------------+ Right Basilic    Diameter (cm)Depth (cm)   Findings    +-----------------+-------------+----------+--------------+ Shoulder                                not visualized +-----------------+-------------+----------+--------------+ Prox upper arm                          not visualized +-----------------+-------------+----------+--------------+ Mid upper arm                           not visualized +-----------------+-------------+----------+--------------+ Dist upper arm       0.31        1.53                  +-----------------+-------------+----------+--------------+ Antecubital fossa    0.32  0.69     branching    +-----------------+-------------+----------+--------------+ Prox forearm         0.25        0.17                  +-----------------+-------------+----------+--------------+ Mid forearm          0.25        0.26                  +-----------------+-------------+----------+--------------+ Distal forearm       0.25        0.22                  +-----------------+-------------+----------+--------------+ Wrist                0.18        0.18                  +-----------------+-------------+----------+--------------+ +-----------------+-------------+----------+-----------------------------+ Left Cephalic    Diameter (cm)Depth (cm)          Findings            +-----------------+-------------+----------+-----------------------------+ Shoulder             0.32        1.44                                 +-----------------+-------------+----------+-----------------------------+ Prox upper arm       0.29        0.84                                  +-----------------+-------------+----------+-----------------------------+ Mid upper arm        0.25        0.36             branching           +-----------------+-------------+----------+-----------------------------+ Dist upper arm       0.25        0.39             branching           +-----------------+-------------+----------+-----------------------------+ Antecubital fossa    0.25        0.37                                 +-----------------+-------------+----------+-----------------------------+ Prox forearm         0.21        0.46             branching           +-----------------+-------------+----------+-----------------------------+ Mid forearm          0.12        0.57             branching           +-----------------+-------------+----------+-----------------------------+ Dist forearm                            not visualized and obstructed +-----------------+-------------+----------+-----------------------------+ Wrist                                   not visualized and obstructed +-----------------+-------------+----------+-----------------------------+ +-----------------+-------------+----------+--------------+ Left Basilic  Diameter (cm)Depth (cm)   Findings    +-----------------+-------------+----------+--------------+ Shoulder                                not visualized +-----------------+-------------+----------+--------------+ Prox upper arm                          not visualized +-----------------+-------------+----------+--------------+ Mid upper arm                           not visualized +-----------------+-------------+----------+--------------+ Dist upper arm       0.32        1.20                  +-----------------+-------------+----------+--------------+ Antecubital fossa    1.70        0.50     branching    +-----------------+-------------+----------+--------------+ Prox forearm         0.15        0.44      branching    +-----------------+-------------+----------+--------------+ Mid forearm          0.15        0.30                  +-----------------+-------------+----------+--------------+ Distal forearm       0.09        1.74                  +-----------------+-------------+----------+--------------+ Wrist                0.13        0.26                  +-----------------+-------------+----------+--------------+ *See table(s) above for measurements and observations.  Diagnosing physician: Genny Kid MD Electronically signed by Genny Kid MD on 06/19/2023 at 7:00:51 PM.    Final    DG Chest 2 View Result Date: 06/15/2023 CLINICAL DATA:  Dyspnea on exertion. EXAM: CHEST - 2 VIEW COMPARISON:  May 25, 2023. FINDINGS: Stable cardiomediastinal silhouette. Both lungs are clear. The visualized skeletal structures are unremarkable. IMPRESSION: No active cardiopulmonary disease. Electronically Signed   By: Rosalene Colon M.D.   On: 06/15/2023 19:23    Microbiology: Results for orders placed or performed during the hospital encounter of 06/15/23  Culture, blood (Routine X 2) w Reflex to ID Panel     Status: None   Collection Time: 06/25/23 12:24 AM   Specimen: BLOOD RIGHT HAND  Result Value Ref Range Status   Specimen Description BLOOD RIGHT HAND  Final   Special Requests   Final    BOTTLES DRAWN AEROBIC AND ANAEROBIC Blood Culture adequate volume   Culture   Final    NO GROWTH 5 DAYS Performed at [facility], [address] [place], [place] [address]    Report Status 06/30/2023 FINAL  Final  Culture, blood (Routine X 2) w Reflex to ID Panel     Status: None   Collection Time: 06/25/23 12:24 AM   Specimen: BLOOD RIGHT ARM  Result Value Ref Range Status   Specimen Description BLOOD RIGHT ARM  Final   Special Requests   Final    BOTTLES DRAWN AEROBIC AND ANAEROBIC Blood Culture adequate volume   Culture   Final    NO GROWTH 5 DAYS Performed at [facility],  [address] [place], [place] [address]    Report Status 06/30/2023 FINAL  Final  MRSA Next Gen by PCR, Nasal     Status: None   Collection Time: 07/02/23 12:48 PM   Specimen: Nasal Mucosa; Nasal Swab  Result Value Ref Range Status   MRSA by PCR Next Gen NOT DETECTED NOT DETECTED Final    Comment: (NOTE) The GeneXpert MRSA Assay (FDA approved for NASAL specimens only), is one component of a comprehensive MRSA colonization surveillance program. It is not intended to diagnose MRSA infection nor to guide or monitor treatment for MRSA infections. Test performance is not FDA approved in patients less than 46 years old. Performed at [facility], [address], [place], [place] [address]   Culture, blood (Routine X 2) w Reflex to ID Panel     Status: None   Collection Time: 07/02/23  3:41 PM   Specimen: BLOOD RIGHT HAND  Result Value Ref Range Status   Specimen Description BLOOD RIGHT HAND  Final   Special Requests   Final    BOTTLES DRAWN AEROBIC AND ANAEROBIC Blood Culture adequate volume   Culture   Final    NO GROWTH 5 DAYS Performed at [facility], [address] [place], [place] [address]    Report Status 07/07/2023 FINAL  Final  Culture, blood (Routine X 2) w Reflex to ID Panel     Status: None   Collection Time: 07/02/23  3:47 PM   Specimen: BLOOD  Result Value Ref Range Status   Specimen Description BLOOD SITE NOT SPECIFIED  Final   Special Requests   Final    BOTTLES DRAWN AEROBIC AND ANAEROBIC Blood Culture adequate volume   Culture   Final    NO GROWTH 5 DAYS Performed at [facility], [address], [place], [place] [address]    Report Status 07/07/2023 FINAL  Final  Culture, blood (Routine X 2) w Reflex to ID Panel     Status: None (Preliminary result)   Collection Time: 07/06/23 11:28 AM   Specimen: BLOOD RIGHT HAND  Result Value Ref Range Status   Specimen Description BLOOD RIGHT HAND  Final   Special Requests AEROBIC BOTTLE ONLY Blood  Culture adequate volume  Final   Culture   Final    NO GROWTH 2 DAYS Performed at [facility], [address], [place], [place] [address]    Report Status PENDING  Incomplete  Culture, blood (Routine X 2) w Reflex to ID Panel     Status: None (Preliminary result)   Collection Time: 07/06/23 11:34 AM   Specimen: BLOOD RIGHT ARM  Result Value Ref Range Status   Specimen Description BLOOD RIGHT ARM  Final   Special Requests   Final    AEROBIC BOTTLE ONLY Blood Culture results may not be optimal due to an inadequate volume of blood received in culture bottles   Culture   Final    NO GROWTH 2 DAYS Performed at [facility], [address], [place], [place] [address]    Report Status PENDING  Incomplete   *Note: Due to a large number of results and/or encounters for the requested time period, some results have not been displayed. A complete set of results can be found in Results Review.    Labs: CBC: Recent Labs  Lab 07/01/23 2200 07/02/23 0339 07/04/23 0448 07/05/23 0512 07/06/23 0554 07/07/23 0759 07/08/23 0519  WBC 10.2   < > 11.9*  11.7* 12.7* 11.2* 10.5  NEUTROABS 8.3*  --   --   --   --  8.8* 8.5*  HGB 6.9*   < > 7.9* 7.7* 8.3* 8.5* 8.0*  HCT 22.7*   < > 24.6* 24.7* 26.0* 28.0* 25.5*  MCV 95.4   < > 90.4 91.8 91.9 94.0 92.1  PLT 231   < > 231 252 246 276 296   < > = values in this interval not displayed.   Basic Metabolic Panel: Recent Labs  Lab 07/01/23 2200 07/03/23 0507 07/05/23 0512 07/07/23 0759 07/08/23 0519  NA 134* 135 135 135 132*  K 5.4* 4.4 3.8 4.6 4.4  CL 101 101 96* 97* 93*  CO2 22 21* 22 19* 19*  GLUCOSE 162* 119* 98 110* 107*  BUN 85* 68* 58* 58* 69*  CREATININE 5.37* 4.81* 5.03* 5.56* 6.54*  CALCIUM  8.7* 8.5* 9.1 9.4 8.8*  PHOS 5.1* 5.1* 4.8* 7.1* 7.5*   Liver Function Tests: Recent Labs  Lab 07/01/23 2200 07/03/23 0507 07/05/23 0512 07/07/23 0759 07/08/23 0519  ALBUMIN  2.1* 2.0* 2.0* 2.1* 1.9*   CBG: Recent Labs  Lab  07/06/23 1744 07/07/23 0744 07/07/23 1129 07/07/23 1650 07/07/23 2109  GLUCAP 213* 126* 148* 153* 151*    Discharge time spent: greater than 30 minutes.  Signed: Albertus Alt, MD [facility] 07/08/2023

## 2023-07-08 NOTE — Procedures (Signed)
 Received patient in bed to unit.  Alert and oriented.  Informed consent signed and in chart.   TX duration: 3.5 hours  Patient tolerated well.  Transported back to the room  Alert, without acute distress.  Hand-off given to patient's nurse.   Access used: right cath Access issues: none  Total UF removed: 3 liters Medication(s) given: vancomycin   Clover Dao, RN [facility]

## 2023-07-08 NOTE — Progress Notes (Signed)
 Inpatient Rehabilitation Admission Medication Review by a Pharmacist  A complete drug regimen review was completed for this patient to identify any potential clinically significant medication issues.  High Risk Drug Classes Is patient taking? Indication by Medication  Antipsychotic Yes, as an intravenous medication Compazine - n/v   Anticoagulant Yes Apixaban - DVT/renal vein thromus   Antibiotic Yes, as an intravenous medication  Vancomycin  QHD + augmentin  to cont for total abx duration of 10d (4/29 stop date entered) - cellulitis   Opioid Yes Vicodin- acute pain   Antiplatelet No   Hypoglycemics/insulin  Yes Insulin - DM   Vasoactive Medication Yes Torsemide - non-HD days   Chemotherapy No   Other Yes bisacodyl   and - constipation Milk and molasses enema- constipation  Diphenhydramine - itching  Robitussin- cough   Colestipol , creon - pancreatic insufficiency Aranesp - anemia  Prozac , mirtazipine- mood  FA, rena vite- supplement  Gabapentin - neuropathy Rivastigmine - dementia  Zanaflex  PRN- muscle spasms  Renvela - CKD-MBD  APAP- pain         Type of Medication Issue Identified Description of Issue Recommendation(s)  Drug Interaction(s) (clinically significant)     Duplicate Therapy     Allergy     No Medication Administration End Date     Incorrect Dose     Additional Drug Therapy Needed     Significant med changes from prior encounter (inform family/care partners about these prior to discharge). PTA meds discontinued- amlodipine , bivcarb tabs, Nitrostat , Lomotil    Med changes: LSX>>torsemide  on non-HD days, pepcid >>pantoprazole  (though pepcid  was continued on discharge from inpatient in addition to PPI)  Communicate relevant medication changes to patient/family members at discharge from CIR.   Restart or discontinue PTA meds not resumed in CIR at discharge if clinically indicated.   Other       Clinically significant medication issues were identified that warrant  physician communication and completion of prescribed/recommended actions by midnight of the next day:  Yes Patient receives torsemide  on non-HD days- she had been receiving HD on MWF whilst inpatinet, but on admission to CIR, her HD schedule was changed to TTS, messaged Pam Love, PA and Torsemide  dosing was changed to new non-HD days  Calcium  carbonate in HD patient ordered as PRN- CoCa at [facility]N with value of 10.1, messaged Pam Love, PA over secure chat and recommendation accepted   Name of provider notified for urgent issues identified:   Provider Method of Notification:     Pharmacist comments:   Time spent performing this drug regimen review (minutes):  45   Chrystie Crass, PharmD Clinical Pharmacist  07/08/2023 2:38 PM

## 2023-07-08 NOTE — Procedures (Signed)
 I was present at this dialysis session. I have reviewed the session itself and made appropriate changes.   Filed Weights   07/06/23 0400 07/08/23 0500 07/08/23 0815  Weight: 71.7 kg 73.2 kg 75.3 kg    Recent Labs  Lab 07/08/23 0519  NA 132*  K 4.4  CL 93*  CO2 19*  GLUCOSE 107*  BUN 69*  CREATININE 6.54*  CALCIUM  8.8*  PHOS 7.5*    Recent Labs  Lab 07/01/23 2200 07/02/23 0339 07/06/23 0554 07/07/23 0759 07/08/23 0519  WBC 10.2   < > 12.7* 11.2* 10.5  NEUTROABS 8.3*  --   --  8.8* 8.5*  HGB 6.9*   < > 8.3* 8.5* 8.0*  HCT 22.7*   < > 26.0* 28.0* 25.5*  MCV 95.4   < > 91.9 94.0 92.1  PLT 231   < > 246 276 296   < > = values in this interval not displayed.    Scheduled Meds:  sodium chloride    Intravenous Once   apixaban   5 mg Oral BID   Chlorhexidine  Gluconate Cloth  6 each Topical Q0600   colestipol   1 g Oral TID   darbepoetin (ARANESP ) injection - DIALYSIS  150 mcg Subcutaneous Q Sun-1800   FLUoxetine   20 mg Oral Daily   folic acid   1 mg Oral Daily   gabapentin   300 mg Oral QHS   heparin  sodium (porcine)       insulin  aspart  0-6 Units Subcutaneous TID WC   lipase/protease/amylase  36,000 Units Oral TID AC   mirtazapine   7.5 mg Oral QHS   multivitamin  1 tablet Oral QHS   pantoprazole   40 mg Oral BID   rivastigmine   1.5 mg Oral BID   sevelamer  carbonate  1,600 mg Oral TID WC   torsemide   100 mg Oral Q T,Th,S,Su   vancomycin  variable dose per unstable renal function (pharmacist dosing)   Does not apply See admin instructions   Continuous Infusions:  piperacillin -tazobactam (ZOSYN )  IV 2.25 g (07/08/23 0635)   vancomycin  750 mg (07/08/23 1106)   PRN Meds:.acetaminophen , heparin  sodium (porcine), HYDROcodone -acetaminophen , mouth rinse, tiZANidine    Katie Cooter,  MD 07/08/2023, 12:03 PM

## 2023-07-08 NOTE — Progress Notes (Signed)
 Progress Note    07/08/2023 10:38 AM 7 Days Post-Op  Subjective:  resting in dialysis  Tm 99.3  Vitals:   07/08/23 1000 07/08/23 1030  BP: (!) 106/55 (!) 105/52  Pulse: 77 77  Resp: 14 13  Temp:    SpO2: 99% 100%    Physical Exam: General:  no distress Cardiac:  regular Lungs:  non labored Incisions:  left antecubital and left axillary incisions look fine.  Some fullness in distal incision but appears unchanged from previous picture taken.  Extremities:  easily palpable left radial pulse.   CBC    Component Value Date/Time   WBC 10.5 07/08/2023 0519   RBC 2.77 (L) 07/08/2023 0519   HGB 8.0 (L) 07/08/2023 0519   HGB 9.4 (L) 04/18/2023 1434   HGB 9.3 (L) 03/14/2017 0810   HCT 25.5 (L) 07/08/2023 0519   HCT 26.4 (L) 07/21/2019 0809   HCT 29.2 (L) 03/14/2017 0810   PLT 296 07/08/2023 0519   PLT 235 04/18/2023 1434   PLT 292 03/14/2017 0810   PLT 351 10/11/2016 1139   MCV 92.1 07/08/2023 0519   MCV 81.3 03/14/2017 0810   MCH 28.9 07/08/2023 0519   MCHC 31.4 07/08/2023 0519   RDW 16.5 (H) 07/08/2023 0519   RDW 14.0 03/14/2017 0810   LYMPHSABS 1.2 07/08/2023 0519   LYMPHSABS 1.4 03/14/2017 0810   MONOABS 0.6 07/08/2023 0519   MONOABS 0.4 03/14/2017 0810   EOSABS 0.1 07/08/2023 0519   EOSABS 0.1 03/14/2017 0810   BASOSABS 0.0 07/08/2023 0519   BASOSABS 0.0 03/14/2017 0810    BMET    Component Value Date/Time   NA 132 (L) 07/08/2023 0519   NA 144 07/19/2020 1458   NA 140 03/14/2017 0810   K 4.4 07/08/2023 0519   K 3.8 03/14/2017 0810   CL 93 (L) 07/08/2023 0519   CO2 19 (L) 07/08/2023 0519   CO2 27 03/14/2017 0810   GLUCOSE 107 (H) 07/08/2023 0519   GLUCOSE 177 (H) 03/14/2017 0810   BUN 69 (H) 07/08/2023 0519   BUN 85 (HH) 07/19/2020 1458   BUN 35.6 (H) 03/14/2017 0810   CREATININE 6.54 (H) 07/08/2023 0519   CREATININE 2.31 (H) 10/25/2022 1355   CREATININE 1.8 (H) 03/14/2017 0810   CALCIUM  8.8 (L) 07/08/2023 0519   CALCIUM  8.3 (L) 06/18/2023  0334   CALCIUM  9.6 03/14/2017 0810   GFRNONAA 6 (L) 07/08/2023 0519   GFRNONAA 22 (L) 10/25/2022 1355   GFRNONAA 45 (L) 12/17/2013 1158   GFRAA 21 (L) 12/21/2019 0845   GFRAA 52 (L) 12/17/2013 1158    INR    Component Value Date/Time   INR 1.5 (H) 07/28/2022 1615     Intake/Output Summary (Last 24 hours) at 07/08/2023 1038 Last data filed at 07/08/2023 0600 Gross per 24 hour  Intake 471.94 ml  Output 0 ml  Net 471.94 ml      Assessment/Plan:  78 y.o. female is s/p:  Insertion of left upper arm AV graft 06/21/2023 and removal of LUA AV graft on 07/01/2023 for steal syndrome 7 Days Post-Op   -called to see pt after left arm MRI.  Pt with palpable left radial pulse.  Her incisions look fine without erythema.  She does have some fullness around the distal incision but this does not look much different than picture on 4/10.  MRI left arm reveals fluid in left arm-all prosthetic has been removed and would expect some fluid around the incisions.  Pt with low grade  fever of 99.3 - left arm incisions do not appear to be cause of low grade fever.  Current blood cx no growth x 2 days.      Maryanna Smart, PA-C [facility] [phone] 07/08/2023 10:38 AM

## 2023-07-08 NOTE — H&P (Addendum)
 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 She was admitted on 06/11/23 with progressive SOB with weight gain,  HF exacerbation and attempts at IV diuresis ineffective and required HD. Internal jugular and left brachial artery to axillary PTFE AV graft placed by Dr. Vikki Graves on 04/04.  She developed mild steal syndrome with numbness right hand. She developed fever  with leucocytosis and new erythema L-AC site felt to  be graft reaction v/s infection and was  treated with Keflex  04/07- 04/16.  Due to ongoing issues with pain secondary to steal syndrome, LUE AV graft removed with vein patch angioplasty on 04/14.    She started having rise in WBC with low grade fevers on 04/15 and antibiotics changed to Zosyn  on 04/17 but continued to have fevers with with rise in WBC therefore Vancomycin  added 04/19 and repeat BC X 2 negative but pending.  She has been requiring IV dilaudid  and transitioned to zanafex and Vicodin. MRI LUE done yesterday revealing mild to moderate elbow joint effusion with common flexor tendinosis and possibility of inflammatory phlegmon 1.8 X 0.5 X 1.5 cm just anterior to muscle at biceps musculotendionsis junction question developing abscess.  VVS recommends continuing antibiotic therapy and Dr.Arrien recommended changing Zosyn  to Augmentin  to continue 10 day course of treatment. Medical issues optimized but she continues to be limited by debility, pain, impaired cognition and requires mod to max assist with ADLs and  min to mod assist with use of left PW for mobility. She was independent for mobility with use of rollater PTA.CIR recommended due to functional decline.      Review of Systems  Unable to perform ROS: Mental acuity            Past Medical History:  Diagnosis Date   Abdominal discomfort      Chronic N/V/D. Presumptive dx Crohn's dx per elevated p ANCA. Failed Entocort and Pentasa. Sep 2003 - ileocolectomy c anastomosis per Dr Duwaine Gins 2/2 adhesions - path was hegative for Crohns. EGD, Sm bowel follow through (11/03), and an eteroclysis (10/03) were unrevealing. Cuases hypomag and hypocalcemia.   Adnexal mass 10/2001    s/p lap BSO (R ovarian fibroma) & lysis of adhesions   Allergy      Seasonal   Anemia      Multifactorial. Baseline HgB 10-11 ish. B12 def - 150 in 3/10. Fe Def - ferritin 35 3/10. Both are being repleted.   Breast cancer (HCC) 03/16/2013    right, 5 o'clock   CAD (coronary artery disease) 1996    1996 - PTCA and angioplasty diagonal branch. 2000 - Rotoblator & angiopllasty of diagonal. 2006 - subendocardial AMI, DES to proximal LAD.Aaron Aas Also had 90% stenosis in distal apical LAD. EF 55 with apical hypokinesis. Indefinite ASA and Plavix .   CHF (congestive heart failure) (HCC)     Chronic kidney disease, stage 4 (severe) (HCC)     Chronic pain      CT 10/10 = Spinal stenosis L2 - S1.   COVID-19  11/02/2020   Diabetes mellitus      Insulin  dependent   Diabetes mellitus type 2 in obese 05/03/2010    Managed on lantus  and novolog . Has diabetic nephropathy. Metformin D/C'd 2012 2/2 creatinine.  No diabetic retinopathy per 3/11.    Gout     Hx of radiation therapy 06/02/13- 07/16/13    right rbeast 4500 cGy 25 sessions, right breast boost 1600 cGy in 8 sessions   Hyperlipidemia      Managed with both a statin and Welchol . Welchol  stopped 2014 2/2 cost and started on fenofibrate     Hypertension      2006 B renal arteries patent. 2003 MRA - no RAS. 2003 pheo W/U Dr Francetta Innocent reportedly  negative.   Hypoxia 07/23/2017   Lupus     Lymphedema of breast     Personal history of radiation therapy 2015   Pneumonia of left lung due to infectious organism     RBBB     Renal vein thrombosis (HCC)     SBO (small bowel obstruction) (HCC) 09/17/2017   Secondary hyperparathyroidism (HCC)     Stroke [facility])      Incidental finding MRI 2002 L lacunar infarct   Vitamin B12 deficiency     Vitamin D  deficiency     Wears dentures      top             Past Surgical History:  Procedure Laterality Date   ABDOMINAL HYSTERECTOMY       ARTERY REPAIR Left 07/01/2023    Procedure: REPAIR, ARTERY, BRACHIAL USING VEIN PATCH;  Surgeon: Young Hensen, MD;  Location: [facility] OR;  Service: Vascular;  Laterality: Left;   AVGG REMOVAL Left 07/01/2023    Procedure: REMOVAL OF ARTERIOVENOUS GORETEX GRAFT (AVGG);  Surgeon: Young Hensen, MD;  Location[facility] OR;  Service: Vascular;  Laterality: Left;  LEFT ARM   BILATERAL SALPINGOOPHORECTOMY   8/03    Lap BSO (R ovarian fibroma) and adhesion lysis   BIOPSY   09/08/2020    Procedure: BIOPSY;  Surgeon: Lanita Pitman, MD;  Location: [facility] ENDOSCOPY;  Service: Endoscopy;;   BOWEL RESECTION   2003    ileocolectomy with anastomosis 2/2 adhesions   BREAST BIOPSY Right 2015   BREAST BIOPSY Right 07/2014   BREAST LUMPECTOMY Right 04/22/2013   BREAST LUMPECTOMY WITH NEEDLE LOCALIZATION AND AXILLARY SENTINEL LYMPH NODE BX Right 04/22/2013    Procedure: BREAST LUMPECTOMY WITH NEEDLE LOCALIZATION AND AXILLARY SENTINEL LYMPH NODE BX;  Surgeon: Lockie Rima, MD;  Location: [facility];  Service: General;  Laterality: Right;   CARDIAC CATHETERIZATION        2 stents   CHOLECYSTECTOMY       COLONOSCOPY       COLONOSCOPY N/A 05/30/2023    Procedure: COLONOSCOPY;  Surgeon: Felecia Hopper, MD;  Location: [facility] ENDOSCOPY;  Service: Gastroenterology;  Laterality: N/A;   ESOPHAGOGASTRODUODENOSCOPY N/A 09/08/2020    Procedure: ESOPHAGOGASTRODUODENOSCOPY  (EGD);  Surgeon: Lanita Pitman, MD;  Location: [facility];  Service: Endoscopy;  Laterality: N/A;   ESOPHAGOGASTRODUODENOSCOPY N/A 05/30/2023    Procedure: EGD (ESOPHAGOGASTRODUODENOSCOPY);  Surgeon: Felecia Hopper, MD;  Location: [facility] ENDOSCOPY;  Service: Gastroenterology;  Laterality: N/A;   HEMICOLECTOMY        R sided hemicolectomy   HERNIA REPAIR        Ventral hernia repair   INSERTION OF ARTERIOVENOUS (AV) ARTEGRAFT ARM Left 06/21/2023    Procedure: INSERTION, GRAFT, ARTERIOVENOUS, UPPER EXTREMITY;  Surgeon: Adine Hoof,  MD;  Location: [facility] OR;  Service: Vascular;  Laterality: Left;   INSERTION OF DIALYSIS CATHETER Right 06/21/2023    Procedure: INSERTION OF DIALYSIS CATHETER;  Surgeon: Adine Hoof, MD;  Location: [facility] OR;  Service: Vascular;  Laterality: Right;   PTCA   4/06               Family History  Problem Relation Age of Onset   Breast cancer Mother 20   Pancreatic cancer Brother 59   Lung cancer Maternal Aunt     Breast cancer Maternal Aunt     Kidney failure Maternal Aunt     Prostate cancer Maternal Uncle     Heart attack Maternal Grandmother     Breast cancer Maternal Grandmother     Colon cancer Maternal Grandmother     Clotting disorder Maternal Grandmother     Diabetes Maternal Grandmother     Hypertension Daughter     Pancreatic cancer Other            Social History:  Family was assisting PTA. She  reports that she has never smoked. She has never used smokeless tobacco. She reports that she does not drink alcohol and does not use drugs.     Allergies       Allergies  Allergen Reactions   Anesthetics, Halogenated Other (See Comments)      Per patient's daughter, she can not tolerate anesthetics.    Etomidate  Other (See Comments)      Heart stopped during anesthesia   Fentanyl  Other (See Comments)      Heart stopped during anesthesia     Percocet [Oxycodone -Acetaminophen ] Other (See Comments)      Headaches      Diazepam Other (See Comments)      Agitation   Lorazepam  Nausea Only   Tramadol  Hcl Nausea And Vomiting   Haloperidol Nausea And Vomiting      **Haldol**   Morphine  Sulfate Nausea Only      agitation   Propoxyphene Nausea And Vomiting      **Darvocet** no longer available   Versed  [Midazolam ] Other (See Comments)      Heart stopped during anesthesia              Medications Prior to Admission  Medication Sig Dispense Refill   acetaminophen  (TYLENOL ) 650 MG CR tablet Take 1,300 mg by mouth every 8 (eight) hours as needed for pain.       amLODipine  (NORVASC ) 10 MG tablet Take 1 tablet (10 mg total) by mouth daily. 90 tablet 3   apixaban  (ELIQUIS ) 2.5 MG TABS tablet Take 1 tablet (2.5 mg total) by mouth 2 (two) times daily. 60 tablet 0   colestipol  (COLESTID ) 1 g tablet Take 1 tablet (1 g total) by mouth 3 (three) times daily. 90 tablet 2   diphenoxylate -atropine  (LOMOTIL ) 2.5-0.025 MG tablet Take 1 tablet by mouth 2 (two) times daily as needed for diarrhea or loose stools. 30 tablet 1   famotidine  (PEPCID ) 20 MG tablet Take 20 mg by mouth daily as needed for heartburn or indigestion.       FLUoxetine  (PROZAC ) 20 MG capsule Take 20 mg by mouth daily.       folic acid  (FOLVITE ) 1 MG tablet Take 1 tablet (1 mg total) by mouth daily. 30 tablet 2   furosemide  (LASIX ) 40 MG tablet Take 1 tablet (40 mg total) by mouth daily. (Patient taking differently: Take 40-80 mg by mouth See admin instructions. Take 1 tablet  by mouth daily and may take an additional tablet for excess swelling) 90 tablet 2   gabapentin  (NEURONTIN ) 300 MG capsule Take 300 mg by mouth at bedtime.       HYDROcodone -acetaminophen  (NORCO/VICODIN) 5-325 MG tablet Take 2 tablets by mouth every 6 (six) hours as needed. 10 tablet 0   lipase/protease/amylase (CREON ) 36000 UNITS CPEP capsule Take 1 capsule (36,000 Units total) by mouth 3 (three) times daily before meals. 180 capsule 2   loperamide  (IMODIUM ) 2 MG capsule Take 1 capsule  (2 mg total) by mouth as needed for diarrhea or loose stools. 30 capsule 0   mirtazapine  (REMERON ) 7.5 MG tablet Take 7.5 mg by mouth at bedtime.       pantoprazole  (PROTONIX ) 40 MG tablet Take 1 tablet (40 mg total) by mouth 2 (two) times daily. (Patient taking differently: Take 40 mg by mouth daily.) 30 tablet 0   rivastigmine  (EXELON ) 1.5 MG capsule Take 1 capsule (1.5 mg total) by mouth 2 (two) times daily. 60 capsule 6   [EXPIRED] sevelamer  carbonate (RENVELA ) 800 MG tablet Take 1 tablet (800 mg total) by mouth 3 (three) times daily with meals. 90 tablet 0   [EXPIRED] sodium bicarbonate  650 MG tablet Take 1 tablet (650 mg total) by mouth 2 (two) times daily. 60 tablet 0   tiZANidine  (ZANAFLEX ) 2 MG tablet Take 2 mg by mouth at bedtime as needed for muscle spasms.       nitroGLYCERIN  (NITROSTAT ) 0.4 MG SL tablet DISSOLVE 1 TABLET UNDER THE TONGUE EVERY 5 MINUTES AS  NEEDED FOR CHEST PAIN. MAX  OF 3 TABLETS IN 15 MINUTES. CALL 911 IF PAIN PERSISTS. (Patient not taking: Reported on 06/15/2023) 25 tablet 0              Home: Home Living Family/patient expects to be discharged to:: Private residence Living Arrangements:  (lives with sister in law, Valeda Garter) Available Help at Discharge: Family, Available 24 hours/day Type of Home: House Home Access: Level entry Home Layout: Two level, 1/2 bath on main level, Able to live on main level with bedroom/bathroom Alternate Level Stairs-Number of Steps: 12 Alternate Level Stairs-Rails: Right Bathroom Shower/Tub: Engineer, manufacturing systems: Standard Bathroom Accessibility: Yes Home Equipment: Agricultural consultant (2 wheels), Rollator (4 wheels), Cane - single point Additional Comments: information obtained from chart, patient confused on eval  Lives With: Family   Functional History: Prior Function Prior Level of Function : Needs assist Mobility Comments: Rollator or RW ADLs Comments: had assist for medications, driving, was to get grabbar  and shower chair after last stay but never delivered per grandaughter in the room   Functional Status:  Mobility: Bed Mobility Overal bed mobility: Needs Assistance Bed Mobility: Supine to Sit Sidelying to sit: Mod assist Supine to sit: Mod assist General bed mobility comments: Mod A to assist pt with BLE mobility and raise trunk Transfers Overall transfer level: Needs assistance Equipment used: Left platform walker Transfers: Sit to/from Stand, Bed to chair/wheelchair/BSC Sit to Stand: Max assist Bed to/from chair/wheelchair/BSC transfer type:: Step pivot Step pivot transfers: Mod assist General transfer comment: Max A to rise into standing from EOB, Mod A to sequence and take steps while maintaining RW. Ambulation/Gait Ambulation/Gait assistance: Min assist, Mod assist Gait Distance (Feet): 12 Feet (&8') Assistive device: Left platform walker Gait Pattern/deviations: Step-to pattern, Decreased stride length, Trunk flexed, Shuffle, Wide base of support General Gait Details: more difficulty progressing R LE than L and increased time with cues for walking forward  to end of the bed, then noted she needed to toilet so turned with A for walker, balance and stepping to get to bathroom, after toileted walked to recliner   ADL: ADL Overall ADL's : Needs assistance/impaired Eating/Feeding: Sitting, Set up, Bed level Grooming: Wash/dry hands, Minimal assistance, Standing Grooming Details (indicate cue type and reason): min A to allow for assist with setting up soap Upper Body Bathing: Sitting, Contact guard assist Upper Body Bathing Details (indicate cue type and reason): UE support Lower Body Bathing: Sitting/lateral leans, Minimal assistance Lower Body Bathing Details (indicate cue type and reason): UE support Upper Body Dressing : Sitting, Minimal assistance Lower Body Dressing: Sitting/lateral leans, Moderate assistance Toilet Transfer: Rolling walker (2 wheels), Moderate assistance,  Stand-pivot Toileting- Clothing Manipulation and Hygiene: Sit to/from stand, Contact guard assist Toileting - Clothing Manipulation Details (indicate cue type and reason): pt wiping self while seated on toilet. Functional mobility during ADLs: Moderate assistance, Rolling walker (2 wheels) General ADL Comments: Reinforced education of CHF diet and exacerbation prevention. Pt negotiated 2 steps with CGA, eduacted pt to go up the stairs facing rail and using it as support   Cognition: Cognition Orientation Level: Oriented to person, Disoriented to place, Disoriented to time, Disoriented to situation Cognition Arousal: Alert Behavior During Therapy: WFL for tasks assessed/performed   Physical Exam: Blood pressure (!) 133/57, pulse 85, temperature 98.4 F (36.9 C), temperature source Oral, resp. rate 18, height (P) 5\' 4"  (1.626 m), weight 75.3 kg, SpO2 99%. Physical Exam Vitals and nursing note reviewed.  Constitutional:      General: She is not in acute distress.    Appearance: She is not ill-appearing.  HENT:     Head: Normocephalic.     Mouth/Throat:     Mouth: Mucous membranes are moist.  Eyes:     Pupils: Pupils are equal, round, and reactive to light.  Cardiovascular:     Rate and Rhythm: Regular rhythm. Tachycardia present.     Pulses: Normal pulses.  Pulmonary:     Effort: Pulmonary effort is normal. No respiratory distress.     Breath sounds: No wheezing.  Abdominal:     General: Bowel sounds are normal. There is no distension.     Tenderness: There is no abdominal tenderness.  Musculoskeletal:        General: Swelling and tenderness (left antecubital region) present.     Cervical back: Normal range of motion.  Skin:    Comments: AVF site CDI. No erythema. Right heel boggy  Neurological:     Mental Status: She is alert.     Comments: Oriented to self only. Unable to recall age, DOB or place. Poor recall and decrease insight into deficits. She was able to follow simple  motor commands. No CN deficits. Limited RUE d/t HD catheter. LUE limited d/t pain. Grip/wrist at least 3-4/5 bilaterally. BLE 3- to 3/5 HF, KE and 4/5 ADF/PF. Decreased LT from ankles down bilaterally. No abnl resting tone. DTR's tr to 1+   Psychiatric:     Comments: Pt flat, cooperative        Lab Results Last 48 Hours        Results for orders placed or performed during the hospital encounter of 06/15/23 (from the past 48 hours)  Glucose, capillary     Status: Abnormal    Collection Time: 07/06/23  5:44 PM  Result Value Ref Range    Glucose-Capillary 213 (H) 70 - 99 mg/dL      Comment: Glucose  reference range applies only to samples taken after fasting for at least 8 hours.  Glucose, capillary     Status: Abnormal    Collection Time: 07/07/23  7:44 AM  Result Value Ref Range    Glucose-Capillary 126 (H) 70 - 99 mg/dL      Comment: Glucose reference range applies only to samples taken after fasting for at least 8 hours.  Renal function panel     Status: Abnormal    Collection Time: 07/07/23  7:59 AM  Result Value Ref Range    Sodium 135 135 - 145 mmol/L    Potassium 4.6 3.5 - 5.1 mmol/L    Chloride 97 (L) 98 - 111 mmol/L    CO2 19 (L) 22 - 32 mmol/L    Glucose, Bld 110 (H) 70 - 99 mg/dL      Comment: Glucose reference range applies only to samples taken after fasting for at least 8 hours.    BUN 58 (H) 8 - 23 mg/dL    Creatinine, Ser 1.61 (H) 0.44 - 1.00 mg/dL    Calcium  9.4 8.9 - 10.3 mg/dL    Phosphorus 7.1 (H) 2.5 - 4.6 mg/dL    Albumin  2.1 (L) 3.5 - 5.0 g/dL    GFR, Estimated 7 (L) >60 mL/min      Comment: (NOTE) Calculated using the CKD-EPI Creatinine Equation (2021)      Anion gap 19 (H) 5 - 15      Comment: Performed at [facility], [address] [place], [place] [address]  CBC with Differential/Platelet     Status: Abnormal    Collection Time: 07/07/23  7:59 AM  Result Value Ref Range    WBC 11.2 (H) 4.0 - 10.5 K/uL    RBC 2.98 (L) 3.87 - 5.11 MIL/uL     Hemoglobin 8.5 (L) 12.0 - 15.0 g/dL    HCT 54.0 (L) 98.1 - 46.0 %    MCV 94.0 80.0 - 100.0 fL    MCH 28.5 26.0 - 34.0 pg    MCHC 30.4 30.0 - 36.0 g/dL    RDW 19.1 (H) 47.8 - 15.5 %    Platelets 276 150 - 400 K/uL    nRBC 0.2 0.0 - 0.2 %    Neutrophils Relative % 79 %    Neutro Abs 8.8 (H) 1.7 - 7.7 K/uL    Lymphocytes Relative 13 %    Lymphs Abs 1.4 0.7 - 4.0 K/uL    Monocytes Relative 7 %    Monocytes Absolute 0.8 0.1 - 1.0 K/uL    Eosinophils Relative 0 %    Eosinophils Absolute 0.0 0.0 - 0.5 K/uL    Basophils Relative 0 %    Basophils Absolute 0.0 0.0 - 0.1 K/uL    Immature Granulocytes 1 %    Abs Immature Granulocytes 0.15 (H) 0.00 - 0.07 K/uL      Comment: Performed at [facility], [address] [place], [place] [address]  Vancomycin , random     Status: None    Collection Time: 07/07/23  7:59 AM  Result Value Ref Range    Vancomycin  Rm 29 ug/mL      Comment:        Random Vancomycin  therapeutic range is dependent on dosage and time of specimen collection. A peak range is 20-40 ug/mL A trough range is 5-15 ug/mL        Performed at [facility], [address] [place], [place] [address]  Glucose, capillary     Status: Abnormal    Collection Time: 07/07/23 11:29 AM  Result Value Ref Range    Glucose-Capillary 148 (H) 70 - 99 mg/dL      Comment: Glucose reference range applies only to samples taken after fasting for at least 8 hours.  Glucose, capillary     Status: Abnormal    Collection Time: 07/07/23  4:50 PM  Result Value Ref Range    Glucose-Capillary 153 (H) 70 - 99 mg/dL      Comment: Glucose reference range applies only to samples taken after fasting for at least 8 hours.  Glucose, capillary     Status: Abnormal    Collection Time: 07/07/23  9:09 PM  Result Value Ref Range    Glucose-Capillary 151 (H) 70 - 99 mg/dL      Comment: Glucose reference range applies only to samples taken after fasting for at least 8 hours.  Renal function panel      Status: Abnormal    Collection Time: 07/08/23  5:19 AM  Result Value Ref Range    Sodium 132 (L) 135 - 145 mmol/L    Potassium 4.4 3.5 - 5.1 mmol/L    Chloride 93 (L) 98 - 111 mmol/L    CO2 19 (L) 22 - 32 mmol/L    Glucose, Bld 107 (H) 70 - 99 mg/dL      Comment: Glucose reference range applies only to samples taken after fasting for at least 8 hours.    BUN 69 (H) 8 - 23 mg/dL    Creatinine, Ser 9.81 (H) 0.44 - 1.00 mg/dL    Calcium  8.8 (L) 8.9 - 10.3 mg/dL    Phosphorus 7.5 (H) 2.5 - 4.6 mg/dL    Albumin  1.9 (L) 3.5 - 5.0 g/dL    GFR, Estimated 6 (L) >60 mL/min      Comment: (NOTE) Calculated using the CKD-EPI Creatinine Equation (2021)      Anion gap 20 (H) 5 - 15      Comment: Performed at [facility], [address], [place], [place] [address]  CBC with Differential/Platelet     Status: Abnormal    Collection Time: 07/08/23  5:19 AM  Result Value Ref Range    WBC 10.5 4.0 - 10.5 K/uL    RBC 2.77 (L) 3.87 - 5.11 MIL/uL    Hemoglobin 8.0 (L) 12.0 - 15.0 g/dL    HCT 82.9 (L) 56.2 - 46.0 %    MCV 92.1 80.0 - 100.0 fL    MCH 28.9 26.0 - 34.0 pg    MCHC 31.4 30.0 - 36.0 g/dL    RDW 13.0 (H) 86.5 - 15.5 %    Platelets 296 150 - 400 K/uL    nRBC 0.2 0.0 - 0.2 %    Neutrophils Relative % 80 %    Neutro Abs 8.5 (H) 1.7 - 7.7 K/uL    Lymphocytes Relative 12 %    Lymphs Abs 1.2 0.7 - 4.0 K/uL    Monocytes Relative 6 %    Monocytes Absolute 0.6 0.1 - 1.0 K/uL    Eosinophils Relative 1 %    Eosinophils Absolute 0.1 0.0 - 0.5 K/uL    Basophils Relative 0 %    Basophils Absolute 0.0 0.0 - 0.1 K/uL    Immature Granulocytes 1 %    Abs Immature Granulocytes 0.13 (H) 0.00 - 0.07 K/uL      Comment: Performed at [facility], [address] [place], [place] [address]  Glucose, capillary     Status: Abnormal    Collection Time: 07/08/23  1:12 PM  Result Value Ref Range    Glucose-Capillary 128 (H) 70 - 99 mg/dL      Comment: Glucose reference range applies only to  samples taken after fasting for at least 8 hours.    *Note: Due to a large number of results and/or encounters for the requested time period, some results have not been displayed. A complete set of results can be found in Results Review.       Imaging Results (Last 48 hours)  MR ELBOW LEFT WO CONTRAST Result Date: 07/07/2023 CLINICAL DATA:  Suspected local infection. EXAM: MRI OF THE LEFT ELBOW WITHOUT CONTRAST TECHNIQUE: Multiplanar, multisequence MR imaging of the elbow was performed. No intravenous contrast was administered. COMPARISON:  None Available. FINDINGS: TENDONS Common forearm flexor origin: Mild to moderate intermediate T2 signal tendinosis of the common flexor tendon origin at the medial epicondyle. Common forearm extensor origin: Mild intermediate T2 signal tendinosis at the proximal deep aspect of the common extensor tendon origin at the lateral epicondyle. Biceps: There is mild fluid at the volar and medial/ulnar aspects of a distal biceps muscle (axial series 12, image 14) and minimal fluid around the biceps musculotendinous junction (axial images 24 through 26), however the tendon otherwise appears intact. There is heterogeneous T2 and decreased T1 signal seen just medial to the distal volar aspect of the biceps muscle, just medial/ulnar/volar to the median antecubital vein and the median nerve. Question intervention in this region such as IV placement, needlestick, or other. This region of heterogeneity and possibly inflammatory phlegmon measures up to approximately 2 cm in transverse dimension, 1.7 cm in AP dimension, and 3 cm in craniocaudal dimension (axial series 4, image 23, sagittal series 11, image 13). There is a small curvilinear region of fluid signal measuring up to approximately 1.8 x 0.5 x 1.5 cm (transverse by AP by craniocaudal) in this region, just anterior to the muscle fascia at the proximal biceps musculotendinous junction. The brachialis tendon is intact. Triceps: The  triceps tendon insertion is intact. LIGAMENTS Medial stabilizers: The ulnar collateral ligament is intact. Lateral stabilizers: The radial collateral ligament and radioulnar collateral ligament are intact. Cartilage: Possible mild degenerative thinning of the medial elbow cartilage at the medial aspect of the trochlea and coronoid. Mild thinning of the lateral aspect of the capitellar cartilage (coronal series 6, image 12). Joint: Mild-to-moderate joint effusion. No significant synovial thickening. Cubital tunnel: Normal appearance of the ulnar nerve. Normal course and signal of the radial and median nerves. Bones: No acute fracture.  No cortical erosion.  No marrow edema. IMPRESSION: 1. There is heterogeneous T2 and decreased T1 signal seen just medial to the distal volar aspect of the biceps muscle, just medial/ulnar/volar to the median antecubital vein and the median nerve. Question intervention in this region such as IV placement, needlestick, or other, recommend clinical correlation. Extending laterally/radially from this region of possibly inflammatory phlegmon is a small curvilinear region of fluid measuring up to approximately 1.8 x 0.5 x 1.5 cm, just anterior to the muscle at the biceps musculotendinous junction. This may represent a small developing abscess. 2. Mild-to-moderate elbow joint effusion. No significant synovial thickening. MRI cannot determine the presence or absence of infection in this joint fluid. 3. Mild-to-moderate common flexor tendinosis. 4. Mild common extensor tendinosis. Electronically Signed   By: Bertina Broccoli M.D.   On: 07/07/2023 11:05  Blood pressure (!) 133/57, pulse 85, temperature 98.4 F (36.9 C), temperature source Oral, resp. rate 18, height (P) 5\' 4"  (1.626 m), weight 75.3 kg, SpO2 99%.   Medical Problem List and Plan: 1. Functional deficits secondary to debility after acute respiratory failure and multiple medical issues             -patient may not yet  shower             -ELOS/Goals: 10-14 days, supervision goals with PT, OT, SLP 2.  Antithrombotics: -DVT/anticoagulation:  Pharmaceutical: Eliquis              -antiplatelet therapy: N/A 3. Acute on chronic LBP/Pain Management: Continue Hydrocodone  prn severe pain--monitor for sedation/confusion.              --Was on IV dilaudid  till 04/16-->now on hydrocodone  10 TID on average.              --increased use of Zanaflex  in past 48 hours.              --continue Gabapentin  300 mg/hs 4. Hx of depression/anxiety/Sleep: LCSW to follow for evaluation and support             --melatonin prn for insomnia.              -antipsychotic agents: N/A             -pt on chronic prozac  and remeron  5. Neuropsych/cognition: This patient is not capable of making decisions on her own behalf. 6. Skin/Wound Care: Routine pressure relief measures.              -float both heels 7. Fluids/Electrolytes/Nutrition: Strict I/O. Labs with HD             --1200 cc FR/day.  8. Cellulitis/Possible abscess LUE: On Van since 04/19 and Zosyn  since 04/17             --WBC trending down to 10.5 today. BC X 2 from 04/19 pending--negative so far.              --has been afebrile X 24 hours.  Zosyn  changed to Augmentin  by Dr. Sunnie England. End date for augmentin  is 4/29              -pt reports that arm is feeling less tender 9. H/o GIB: Monitor H/H as had further drop. Hgb down to             On IV iron /ESA 10. HTN: Monitor BP TID--on Torsemide  on non dialysis days.  11. Steal syndrome Left hand: AV graft removed on 04/15 12. H/o Gastritis/esophagitis: Most recent admission last month. Continue PPI BID.  --Has required one unit PRBC 04/16.              --monitor for signs of bleeding.  13. Chronic diarrhea:  Managed with Colestid  and Creon  tid ac.  14. H/o cognitive/memory deficits: On Exelon  bid.   15. ESRD: Now HD dependent--TTS at the end of the day to help with tolerance of therapy             --on renveal for metabolic bone  disease.   17. T2DM: Hgb A1c- 6.2 and diet controlled. Add CM restrictions.  --Will monitor BS ac/hs and use SSI for elevated BS.        Zelda Hickman, PA-C 07/08/2023  I have personally performed a face to face diagnostic evaluation of this patient and formulated the key components of the plan.  Additionally, I have personally reviewed laboratory data, imaging studies, as well as relevant notes and concur with the physician assistant's documentation above.  The patient's status has not changed from the original H&P.  Any changes in documentation from the acute care chart have been noted above.  Rawland Caddy, MD, [facility]

## 2023-07-08 NOTE — Progress Notes (Signed)
 Patient ID: [name], female   DOB: 02-05-1946, 78 y.o.   MRN: [id number] Met with the patient to review medical situation, rehab process, team conference and plan of care. Discussed new dx of ESRD on HD, fluid restriction, daily weights, low purine diet/renal diet and medications for HF, HTN, CAD, CKD and nutritional supplements.  Continue to follow along to address educational needs to facilitate prep for discharge. Naoma Bacca

## 2023-07-08 NOTE — Progress Notes (Signed)
 Patient ID: [name], female   DOB: 1945/12/22, 78 y.o.   MRN: [id number] S: Patient seen on dialysis today with no complaints  O:BP 99/60   Pulse 81   Temp 99.3 F (37.4 C)   Resp 11   Ht (P) 5\' 4"  (1.626 m)   Wt 75.3 kg   SpO2 99%   BMI (P) 28.49 kg/m   Intake/Output Summary (Last 24 hours) at 07/08/2023 1158 Last data filed at 07/08/2023 0600 Gross per 24 hour  Intake 471.94 ml  Output 0 ml  Net 471.94 ml   Intake/Output: I/O last 3 completed shifts: In: 780 [P.O.:600; IV Piggyback:180] Out: 0   Intake/Output this shift:  No intake/output data recorded. Weight change:  Gen: NAD lying in bed CVS: Normal rate, no rub Resp: Bilateral chest rise with no increased work of breathing Abd: +BS, soft, NT/ND Ext: no edema, warm and well-perfused  Recent Labs  Lab 07/01/23 2200 07/03/23 0507 07/05/23 0512 07/07/23 0759 07/08/23 0519  NA 134* 135 135 135 132*  K 5.4* 4.4 3.8 4.6 4.4  CL 101 101 96* 97* 93*  CO2 22 21* 22 19* 19*  GLUCOSE 162* 119* 98 110* 107*  BUN 85* 68* 58* 58* 69*  CREATININE 5.37* 4.81* 5.03* 5.56* 6.54*  ALBUMIN  2.1* 2.0* 2.0* 2.1* 1.9*  CALCIUM  8.7* 8.5* 9.1 9.4 8.8*  PHOS 5.1* 5.1* 4.8* 7.1* 7.5*   Liver Function Tests: Recent Labs  Lab 07/05/23 0512 07/07/23 0759 07/08/23 0519  ALBUMIN  2.0* 2.1* 1.9*   No results for input(s): "LIPASE", "AMYLASE" in the last 168 hours. No results for input(s): "AMMONIA" in the last 168 hours. CBC: Recent Labs  Lab 07/01/23 2200 07/02/23 0339 07/04/23 0448 07/05/23 0512 07/06/23 0554 07/07/23 0759 07/08/23 0519  WBC 10.2   < > 11.9* 11.7* 12.7* 11.2* 10.5  NEUTROABS 8.3*  --   --   --   --  8.8* 8.5*  HGB 6.9*   < > 7.9* 7.7* 8.3* 8.5* 8.0*  HCT 22.7*   < > 24.6* 24.7* 26.0* 28.0* 25.5*  MCV 95.4   < > 90.4 91.8 91.9 94.0 92.1  PLT 231   < > 231 252 246 276 296   < > = values in this interval not displayed.   Cardiac Enzymes: No results for input(s): "CKTOTAL", "CKMB", "CKMBINDEX",  "TROPONINI" in the last 168 hours. CBG: Recent Labs  Lab 07/06/23 1744 07/07/23 0744 07/07/23 1129 07/07/23 1650 07/07/23 2109  GLUCAP 213* 126* 148* 153* 151*    Iron  Studies: No results for input(s): "IRON ", "TIBC", "TRANSFERRIN", "FERRITIN" in the last 72 hours. Studies/Results: MR ELBOW LEFT WO CONTRAST Result Date: 07/07/2023 CLINICAL DATA:  Suspected local infection. EXAM: MRI OF THE LEFT ELBOW WITHOUT CONTRAST TECHNIQUE: Multiplanar, multisequence MR imaging of the elbow was performed. No intravenous contrast was administered. COMPARISON:  None Available. FINDINGS: TENDONS Common forearm flexor origin: Mild to moderate intermediate T2 signal tendinosis of the common flexor tendon origin at the medial epicondyle. Common forearm extensor origin: Mild intermediate T2 signal tendinosis at the proximal deep aspect of the common extensor tendon origin at the lateral epicondyle. Biceps: There is mild fluid at the volar and medial/ulnar aspects of a distal biceps muscle (axial series 12, image 14) and minimal fluid around the biceps musculotendinous junction (axial images 24 through 26), however the tendon otherwise appears intact. There is heterogeneous T2 and decreased T1 signal seen just medial to the distal volar aspect of the biceps muscle, just medial/ulnar/volar  to the median antecubital vein and the median nerve. Question intervention in this region such as IV placement, needlestick, or other. This region of heterogeneity and possibly inflammatory phlegmon measures up to approximately 2 cm in transverse dimension, 1.7 cm in AP dimension, and 3 cm in craniocaudal dimension (axial series 4, image 23, sagittal series 11, image 13). There is a small curvilinear region of fluid signal measuring up to approximately 1.8 x 0.5 x 1.5 cm (transverse by AP by craniocaudal) in this region, just anterior to the muscle fascia at the proximal biceps musculotendinous junction. The brachialis tendon is intact.  Triceps: The triceps tendon insertion is intact. LIGAMENTS Medial stabilizers: The ulnar collateral ligament is intact. Lateral stabilizers: The radial collateral ligament and radioulnar collateral ligament are intact. Cartilage: Possible mild degenerative thinning of the medial elbow cartilage at the medial aspect of the trochlea and coronoid. Mild thinning of the lateral aspect of the capitellar cartilage (coronal series 6, image 12). Joint: Mild-to-moderate joint effusion. No significant synovial thickening. Cubital tunnel: Normal appearance of the ulnar nerve. Normal course and signal of the radial and median nerves. Bones: No acute fracture.  No cortical erosion.  No marrow edema. IMPRESSION: 1. There is heterogeneous T2 and decreased T1 signal seen just medial to the distal volar aspect of the biceps muscle, just medial/ulnar/volar to the median antecubital vein and the median nerve. Question intervention in this region such as IV placement, needlestick, or other, recommend clinical correlation. Extending laterally/radially from this region of possibly inflammatory phlegmon is a small curvilinear region of fluid measuring up to approximately 1.8 x 0.5 x 1.5 cm, just anterior to the muscle at the biceps musculotendinous junction. This may represent a small developing abscess. 2. Mild-to-moderate elbow joint effusion. No significant synovial thickening. MRI cannot determine the presence or absence of infection in this joint fluid. 3. Mild-to-moderate common flexor tendinosis. 4. Mild common extensor tendinosis. Electronically Signed   By: Bertina Broccoli M.D.   On: 07/07/2023 11:05    sodium chloride    Intravenous Once   apixaban   5 mg Oral BID   Chlorhexidine  Gluconate Cloth  6 each Topical Q0600   colestipol   1 g Oral TID   darbepoetin (ARANESP ) injection - DIALYSIS  150 mcg Subcutaneous Q Sun-1800   FLUoxetine   20 mg Oral Daily   folic acid   1 mg Oral Daily   gabapentin   300 mg Oral QHS   heparin   sodium (porcine)       insulin  aspart  0-6 Units Subcutaneous TID WC   lipase/protease/amylase  36,000 Units Oral TID AC   mirtazapine   7.5 mg Oral QHS   multivitamin  1 tablet Oral QHS   pantoprazole   40 mg Oral BID   rivastigmine   1.5 mg Oral BID   sevelamer  carbonate  1,600 mg Oral TID WC   torsemide   100 mg Oral Q T,Th,S,Su   vancomycin  variable dose per unstable renal function (pharmacist dosing)   Does not apply See admin instructions    BMET    Component Value Date/Time   NA 132 (L) 07/08/2023 0519   NA 144 07/19/2020 1458   NA 140 03/14/2017 0810   K 4.4 07/08/2023 0519   K 3.8 03/14/2017 0810   CL 93 (L) 07/08/2023 0519   CO2 19 (L) 07/08/2023 0519   CO2 27 03/14/2017 0810   GLUCOSE 107 (H) 07/08/2023 0519   GLUCOSE 177 (H) 03/14/2017 0810   BUN 69 (H) 07/08/2023 0519   BUN 85 (HH)  07/19/2020 1458   BUN 35.6 (H) 03/14/2017 0810   CREATININE 6.54 (H) 07/08/2023 0519   CREATININE 2.31 (H) 10/25/2022 1355   CREATININE 1.8 (H) 03/14/2017 0810   CALCIUM  8.8 (L) 07/08/2023 0519   CALCIUM  8.3 (L) 06/18/2023 0334   CALCIUM  9.6 03/14/2017 0810   GFRNONAA 6 (L) 07/08/2023 0519   GFRNONAA 22 (L) 10/25/2022 1355   GFRNONAA 45 (L) 12/17/2013 1158   GFRAA 21 (L) 12/21/2019 0845   GFRAA 52 (L) 12/17/2013 1158   CBC    Component Value Date/Time   WBC 10.5 07/08/2023 0519   RBC 2.77 (L) 07/08/2023 0519   HGB 8.0 (L) 07/08/2023 0519   HGB 9.4 (L) 04/18/2023 1434   HGB 9.3 (L) 03/14/2017 0810   HCT 25.5 (L) 07/08/2023 0519   HCT 26.4 (L) 07/21/2019 0809   HCT 29.2 (L) 03/14/2017 0810   PLT 296 07/08/2023 0519   PLT 235 04/18/2023 1434   PLT 292 03/14/2017 0810   PLT 351 10/11/2016 1139   MCV 92.1 07/08/2023 0519   MCV 81.3 03/14/2017 0810   MCH 28.9 07/08/2023 0519   MCHC 31.4 07/08/2023 0519   RDW 16.5 (H) 07/08/2023 0519   RDW 14.0 03/14/2017 0810   LYMPHSABS 1.2 07/08/2023 0519   LYMPHSABS 1.4 03/14/2017 0810   MONOABS 0.6 07/08/2023 0519   MONOABS 0.4  03/14/2017 0810   EOSABS 0.1 07/08/2023 0519   EOSABS 0.1 03/14/2017 0810   BASOSABS 0.0 07/08/2023 0519   BASOSABS 0.0 03/14/2017 0810    HPI:  Pt is a 78 y.o.  with  hypertension, renal vein thrombosis on Eliquis , stroke, type II DM, lupus, CKD 4-5 who was recently admitted for CHF exacerbation and progressive CKD presented back with worsening dyspnea on exertion and fluid overload.      Assessment/Plan:   # Progressive CKD 5 versus AKI on CKD,: With frequent hospitalization for fluid overload.  Responding to IV diuretics but not po and with worsening renal function d/w patient and family (Daughter on phone 4/2) at length recommend starting dialysis. S/p LUE AVG + TDC placement 4/4  - On MWF schedule -> switch to TTS while at IR. Next HD thursday - CLIP complete-  has [facility] - AVG removed as below   # CKD-MBD/hyperphosphatemia: Increased sevelamer  today. CTM. PTH at goal. Repeat on 5/1   #Anemia of CKD: Concern about GI bleed, underwent endoscopy t recently with no sign of bleeding.  Back on Eliquis .  Hb has dropped again and is due for blood transfusion.  Given IV iron  and ESA   # Hypertension/volume: [name] improved after diuresis.  Torsemide  on non-dialysis days   #A fib: on eliquis    # Vascular access - developed steal syndrome of left hand. - VVS following s/p excision of AVG 07/01/23 due to concern for steal syndrome with improvement of symptoms and ok to resume anticoagulation. Also has possible abscess in LUE on abx and followed by surgery   #Disposition - CIR today

## 2023-07-08 NOTE — Progress Notes (Signed)
Inpatient Rehab Admissions Coordinator:  ?  ?Pt. To admit to CIR today. RN may call report to [phone] ? ?Laura Staley, MS, CCC-SLP ?Rehab Admissions Coordinator  ?[phone] (celll) ?[phone] (office) ? ?

## 2023-07-09 ENCOUNTER — Inpatient Hospital Stay (HOSPITAL_COMMUNITY)

## 2023-07-09 LAB — RENAL FUNCTION PANEL
Albumin: 2.1 g/dL — ABNORMAL LOW (ref 3.5–5.0)
Anion gap: 14 (ref 5–15)
BUN: 36 mg/dL — ABNORMAL HIGH (ref 8–23)
CO2: 23 mmol/L (ref 22–32)
Calcium: 9.4 mg/dL (ref 8.9–10.3)
Chloride: 95 mmol/L — ABNORMAL LOW (ref 98–111)
Creatinine, Ser: 4.66 mg/dL — ABNORMAL HIGH (ref 0.44–1.00)
GFR, Estimated: 9 mL/min — ABNORMAL LOW (ref >60)
Glucose, Bld: 84 mg/dL (ref 70–99)
Phosphorus: 6.7 mg/dL — ABNORMAL HIGH (ref 2.5–4.6)
Potassium: 4.2 mmol/L (ref 3.5–5.1)
Sodium: 132 mmol/L — ABNORMAL LOW (ref 135–145)

## 2023-07-09 LAB — CBC
HCT: 27.8 % — ABNORMAL LOW (ref 36.0–46.0)
Hemoglobin: 8.6 g/dL — ABNORMAL LOW (ref 12.0–15.0)
MCH: 28.5 pg (ref 26.0–34.0)
MCHC: 30.9 g/dL (ref 30.0–36.0)
MCV: 92.1 fL (ref 80.0–100.0)
Platelets: 348 10*3/uL (ref 150–400)
RBC: 3.02 MIL/uL — ABNORMAL LOW (ref 3.87–5.11)
RDW: 16.5 % — ABNORMAL HIGH (ref 11.5–15.5)
WBC: 11.9 10*3/uL — ABNORMAL HIGH (ref 4.0–10.5)
nRBC: 0 % (ref 0.0–0.2)

## 2023-07-09 LAB — GLUCOSE, CAPILLARY
Glucose-Capillary: 83 mg/dL (ref 70–99)
Glucose-Capillary: 165 mg/dL — ABNORMAL HIGH (ref 70–99)
Glucose-Capillary: 161 mg/dL — ABNORMAL HIGH (ref 70–99)

## 2023-07-09 MED ORDER — L-METHYLFOLATE-B6-B12 3-35-2 MG PO TABS
1.0000 | ORAL_TABLET | Freq: Every day | ORAL | Status: DC
  Administered 2023-07-09 – 2023-08-01 (×24): 1 via ORAL
  Filled 2023-07-09 (×24): qty 1

## 2023-07-09 MED ORDER — DICLOFENAC SODIUM 1 % EX GEL
2.0000 g | Freq: Four times a day (QID) | CUTANEOUS | Status: DC
  Administered 2023-07-09 – 2023-07-24 (×49): 2 g via TOPICAL
  Filled 2023-07-09: qty 100

## 2023-07-09 MED ORDER — VITAMIN D 25 MCG (1000 UNIT) PO TABS
1000.0000 [IU] | ORAL_TABLET | Freq: Every day | ORAL | Status: DC
  Administered 2023-07-09 – 2023-08-01 (×24): 1000 [IU] via ORAL
  Filled 2023-07-09 (×24): qty 1

## 2023-07-09 MED ORDER — VANCOMYCIN HCL 750 MG/150ML IV SOLN
750.0000 mg | INTRAVENOUS | Status: DC
  Administered 2023-07-11 – 2023-07-14 (×2): 750 mg via INTRAVENOUS
  Filled 2023-07-09 (×2): qty 150

## 2023-07-09 MED ORDER — CHLORHEXIDINE GLUCONATE CLOTH 2 % EX PADS
6.0000 | MEDICATED_PAD | Freq: Two times a day (BID) | CUTANEOUS | Status: DC
  Administered 2023-07-09 – 2023-08-01 (×42): 6 via TOPICAL

## 2023-07-09 MED ORDER — B COMPLEX-C PO TABS
1.0000 | ORAL_TABLET | Freq: Every day | ORAL | Status: DC
  Administered 2023-07-09 – 2023-08-01 (×24): 1 via ORAL
  Filled 2023-07-09 (×24): qty 1

## 2023-07-09 NOTE — Plan of Care (Signed)
 Problem: RH Balance Goal: LTG Patient will maintain dynamic standing with ADLs (OT) Description: LTG:  Patient will maintain dynamic standing balance with assist during activities of daily living (OT)  Flowsheets (Taken 07/09/2023 1226) LTG: Pt will maintain dynamic standing balance during ADLs with: Supervision/Verbal cueing   Problem: Sit to Stand Goal: LTG:  Patient will perform sit to stand in prep for activites of daily living with assistance level (OT) Description: LTG:  Patient will perform sit to stand in prep for activites of daily living with assistance level (OT) Flowsheets (Taken 07/09/2023 1226) LTG: PT will perform sit to stand in prep for activites of daily living with assistance level: Supervision/Verbal cueing   Problem: RH Eating Goal: LTG Patient will perform eating w/assist, cues/equip (OT) Description: LTG: Patient will perform eating with assist, with/without cues using equipment (OT) Flowsheets (Taken 07/09/2023 1226) LTG: Pt will perform eating with assistance level of: Supervision/Verbal cueing   Problem: RH Grooming Goal: LTG Patient will perform grooming w/assist,cues/equip (OT) Description: LTG: Patient will perform grooming with assist, with/without cues using equipment (OT) Flowsheets (Taken 07/09/2023 1226) LTG: Pt will perform grooming with assistance level of: Supervision/Verbal cueing   Problem: RH Bathing Goal: LTG Patient will bathe all body parts with assist levels (OT) Description: LTG: Patient will bathe all body parts with assist levels (OT) Flowsheets (Taken 07/09/2023 1226) LTG: Pt will perform bathing with assistance level/cueing: Supervision/Verbal cueing   Problem: RH Dressing Goal: LTG Patient will perform upper body dressing (OT) Description: LTG Patient will perform upper body dressing with assist, with/without cues (OT). Flowsheets (Taken 07/09/2023 1226) LTG: Pt will perform upper body dressing with assistance level of:  Supervision/Verbal cueing Goal: LTG Patient will perform lower body dressing w/assist (OT) Description: LTG: Patient will perform lower body dressing with assist, with/without cues in positioning using equipment (OT) Flowsheets (Taken 07/09/2023 1226) LTG: Pt will perform lower body dressing with assistance level of: Supervision/Verbal cueing   Problem: RH Toileting Goal: LTG Patient will perform toileting task (3/3 steps) with assistance level (OT) Description: LTG: Patient will perform toileting task (3/3 steps) with assistance level (OT)  Flowsheets (Taken 07/09/2023 1226) LTG: Pt will perform toileting task (3/3 steps) with assistance level: Supervision/Verbal cueing   Problem: RH Toilet Transfers Goal: LTG Patient will perform toilet transfers w/assist (OT) Description: LTG: Patient will perform toilet transfers with assist, with/without cues using equipment (OT) Flowsheets (Taken 07/09/2023 1226) LTG: Pt will perform toilet transfers with assistance level of: Supervision/Verbal cueing   Problem: RH Tub/Shower Transfers Goal: LTG Patient will perform tub/shower transfers w/assist (OT) Description: LTG: Patient will perform tub/shower transfers with assist, with/without cues using equipment (OT) Flowsheets (Taken 07/09/2023 1226) LTG: Pt will perform tub/shower stall transfers with assistance level of: Contact Guard/Touching assist   Problem: RH Memory Goal: LTG Patient will demonstrate ability for day to day recall/carry over during activities of daily living with assistance level (OT) Description: LTG:  Patient will demonstrate ability for day to day recall/carry over during activities of daily living with assistance level (OT). Flowsheets (Taken 07/09/2023 1226) LTG:  Patient will demonstrate ability for day to day recall/carry over during activities of daily living with assistance level (OT): Supervision   Problem: RH Awareness Goal: LTG: Patient will demonstrate awareness during  functional activites type of (OT) Description: LTG: Patient will demonstrate awareness during functional activites type of (OT) Flowsheets (Taken 07/09/2023 1226) Patient will demonstrate awareness during functional activites type of: Emergent LTG: Patient will demonstrate awareness during functional activites type of (  OT): Supervision

## 2023-07-09 NOTE — Progress Notes (Signed)
 Patient ID: [name], female   DOB: [birth year], 78 y.o.   MRN: [id number]   [phone]- SW spoke with pt dtr [name] to discuss plan of care. She reports pt will return to home with DIL [name] who will be primary caregiver. Reports pt mother was getting support with medication management and housekeeping, meals. Would ike for pt to get back to baseline and ambulate independently.  She also reports her mother had has increased LE weakness and now UE weakness. Reports she has a physician [place], [name]- addressing non surgical SCI. She had two epidural injections Dec and Jan and reports has had a continual decline. She would like updates after team conference. SW informed will follow-up and share concerns with medical team. Dme- grab bars in bathroom, shower chair, BSC.   [name], MSW, LCSW Office: [phone] Cell: [phone] Fax: [phone]

## 2023-07-09 NOTE — Progress Notes (Signed)
 PROGRESS NOTE   Subjective/Complaints: No new complaints this morning to me, but Antony Baumgartner PT noted upper and lower extremity weakness and pain limited her sessions, will start daily steroid to see if this helps  ROS: +upper and lower extremity wekaness   Objective:   MR ELBOW LEFT WO CONTRAST Result Date: 07/07/2023 CLINICAL DATA:  Suspected local infection. EXAM: MRI OF THE LEFT ELBOW WITHOUT CONTRAST TECHNIQUE: Multiplanar, multisequence MR imaging of the elbow was performed. No intravenous contrast was administered. COMPARISON:  None Available. FINDINGS: TENDONS Common forearm flexor origin: Mild to moderate intermediate T2 signal tendinosis of the common flexor tendon origin at the medial epicondyle. Common forearm extensor origin: Mild intermediate T2 signal tendinosis at the proximal deep aspect of the common extensor tendon origin at the lateral epicondyle. Biceps: There is mild fluid at the volar and medial/ulnar aspects of a distal biceps muscle (axial series 12, image 14) and minimal fluid around the biceps musculotendinous junction (axial images 24 through 26), however the tendon otherwise appears intact. There is heterogeneous T2 and decreased T1 signal seen just medial to the distal volar aspect of the biceps muscle, just medial/ulnar/volar to the median antecubital vein and the median nerve. Question intervention in this region such as IV placement, needlestick, or other. This region of heterogeneity and possibly inflammatory phlegmon measures up to approximately 2 cm in transverse dimension, 1.7 cm in AP dimension, and 3 cm in craniocaudal dimension (axial series 4, image 23, sagittal series 11, image 13). There is a small curvilinear region of fluid signal measuring up to approximately 1.8 x 0.5 x 1.5 cm (transverse by AP by craniocaudal) in this region, just anterior to the muscle fascia at the proximal biceps musculotendinous  junction. The brachialis tendon is intact. Triceps: The triceps tendon insertion is intact. LIGAMENTS Medial stabilizers: The ulnar collateral ligament is intact. Lateral stabilizers: The radial collateral ligament and radioulnar collateral ligament are intact. Cartilage: Possible mild degenerative thinning of the medial elbow cartilage at the medial aspect of the trochlea and coronoid. Mild thinning of the lateral aspect of the capitellar cartilage (coronal series 6, image 12). Joint: Mild-to-moderate joint effusion. No significant synovial thickening. Cubital tunnel: Normal appearance of the ulnar nerve. Normal course and signal of the radial and median nerves. Bones: No acute fracture.  No cortical erosion.  No marrow edema. IMPRESSION: 1. There is heterogeneous T2 and decreased T1 signal seen just medial to the distal volar aspect of the biceps muscle, just medial/ulnar/volar to the median antecubital vein and the median nerve. Question intervention in this region such as IV placement, needlestick, or other, recommend clinical correlation. Extending laterally/radially from this region of possibly inflammatory phlegmon is a small curvilinear region of fluid measuring up to approximately 1.8 x 0.5 x 1.5 cm, just anterior to the muscle at the biceps musculotendinous junction. This may represent a small developing abscess. 2. Mild-to-moderate elbow joint effusion. No significant synovial thickening. MRI cannot determine the presence or absence of infection in this joint fluid. 3. Mild-to-moderate common flexor tendinosis. 4. Mild common extensor tendinosis. Electronically Signed   By: Bertina Broccoli M.D.   On: 07/07/2023 11:05   Recent Labs  07/08/23 0519 07/09/23 0505  WBC 10.5 11.9*  HGB 8.0* 8.6*  HCT 25.5* 27.8*  PLT 296 348   Recent Labs    07/08/23 0519 07/09/23 0505  NA 132* 132*  K 4.4 4.2  CL 93* 95*  CO2 19* 23  GLUCOSE 107* 84  BUN 69* 36*  CREATININE 6.54* 4.66*  CALCIUM  8.8* 9.4     Intake/Output Summary (Last 24 hours) at 07/09/2023 0955 Last data filed at 07/09/2023 0900 Gross per 24 hour  Intake 180 ml  Output 1 ml  Net 179 ml        Physical Exam: Vital Signs Blood pressure (!) 157/65, pulse 89, temperature 99.2 F (37.3 C), temperature source Oral, resp. rate 17, height 5\' 4"  (1.626 m), weight 75.3 kg, SpO2 100%. Gen: no distress, normal appearing HEENT: oral mucosa pink and moist, NCAT Cardio: Reg rate Chest: normal effort, normal rate of breathing Abd: soft, non-distended Ext: no edema Musculoskeletal:        General: Swelling and tenderness (left antecubital region) present.     Cervical back: Normal range of motion.  Skin:    Comments: AVF site CDI. No erythema. Right heel boggy  Neurological:     Mental Status: She is alert.     Comments: Oriented to self only. Unable to recall age, DOB or place. Poor recall and decrease insight into deficits. She was able to follow simple motor commands. No CN deficits. Limited RUE d/t HD catheter. LUE limited d/t pain. Grip/wrist at least 3-4/5 bilaterally. BLE 3- to 3/5 HF, KE and 4/5 ADF/PF. Decreased LT from ankles down bilaterally. No abnl resting tone. DTR's tr to 1+   Psychiatric:     Comments: Pt flat, cooperative     Assessment/Plan: 1. Functional deficits which require 3+ hours per day of interdisciplinary therapy in a comprehensive inpatient rehab setting. Physiatrist is providing close team supervision and 24 hour management of active medical problems listed below. Physiatrist and rehab team continue to assess barriers to discharge/monitor patient progress toward functional and medical goals  Care Tool:  Bathing              Bathing assist       Upper Body Dressing/Undressing Upper body dressing        Upper body assist      Lower Body Dressing/Undressing Lower body dressing            Lower body assist       Toileting Toileting    Toileting assist        Transfers Chair/bed transfer  Transfers assist           Locomotion Ambulation   Ambulation assist              Walk 10 feet activity   Assist           Walk 50 feet activity   Assist           Walk 150 feet activity   Assist           Walk 10 feet on uneven surface  activity   Assist           Wheelchair     Assist               Wheelchair 50 feet with 2 turns activity    Assist            Wheelchair 150 feet activity     Assist  Blood pressure (!) 157/65, pulse 89, temperature 99.2 F (37.3 C), temperature source Oral, resp. rate 17, height 5\' 4"  (1.626 m), weight 75.3 kg, SpO2 100%.  Medical Problem List and Plan: 1. Functional deficits secondary to debility after acute respiratory failure and multiple medical issues             -patient may not yet shower             -ELOS/Goals: 10-14 days, supervision goals with PT, OT, SLP  Left message for daughter to update her 2.  Antithrombotics: -DVT/anticoagulation:  Pharmaceutical: Eliquis              -antiplatelet therapy: N/A 3. Acute on chronic LBP/Pain Management: Continue Hydrocodone  prn severe pain--monitor for sedation/confusion.              --Was on IV dilaudid  till 04/16-->now on hydrocodone  10 TID on average.              --increased use of Zanaflex  in past 48 hours.              --continue Gabapentin  300 mg/hs 4. Hx of depression/anxiety/Sleep: LCSW to follow for evaluation and support             --melatonin prn for insomnia.              -antipsychotic agents: N/A             -pt on chronic prozac  and remeron  5. Neuropsych/cognition: This patient is not capable of making decisions on her own behalf. 6. Skin/Wound Care: Routine pressure relief measures.              -float both heels 7. Fluids/Electrolytes/Nutrition: Strict I/O. Labs with HD             --1200 cc FR/day.  8. Cellulitis/Possible abscess LUE: On Van since 04/19 and  Zosyn  since 04/17             --WBC trending down to 10.5 today. BC X 2 from 04/19 pending--negative so far.              --has been afebrile X 24 hours.  Zosyn  changed to Augmentin  by Dr. Sunnie England. End date for augmentin  is 4/29              -pt reports that arm is feeling less tender 9. H/o GIB: Monitor H/H as had further drop. Hgb down to             On IV iron /ESA 10. HTN: Monitor BP TID--on Torsemide  on non dialysis days.  11. Steal syndrome Left hand: AV graft removed on 04/15 12. H/o Gastritis/esophagitis: Most recent admission last month. Continue PPI BID.  --Has required one unit PRBC 04/16.              --monitor for signs of bleeding.  13. Chronic diarrhea:  Managed with Colestid  and Creon  tid ac.  14. H/o cognitive/memory deficits: On Exelon  bid.    15. ESRD: Now HD dependent--TTS at the end of the day to help with tolerance of therapy             --continue renveal for metabolic bone disease.    17. T2DM: Hgb A1c- 6.2 and diet controlled. Add CM restrictions.  -d/c ISS  18. Bilateral knee pain: voltaren  gel scheduled  LOS: 1 days A FACE TO FACE EVALUATION WAS PERFORMED  Keven Pel Cayson Kalb 07/09/2023, 9:55 AM

## 2023-07-09 NOTE — Discharge Instructions (Addendum)
 Inpatient Rehab Discharge Instructions  [name] Discharge date and time:    Activities/Precautions/ Functional Status: Activity: no lifting, driving, or strenuous exercise till cleared by MD Diet: diabetic diet and renal diet--limit fluids to 1200 cc per day (equal to 5 cups per day) Wound Care: keep wound clean and dry   Functional status:  ___ No restrictions     ___ Walk up steps independently _X__ 24/7 supervision/assistance   ___ Walk up steps with assistance ___ Intermittent supervision/assistance  ___ Bathe/dress independently ___ Walk with walker     _X__ Bathe/dress with assistance ___ Walk Independently    ___ Shower independently ___ Walk with assistance    ___ Shower with assistance _X__ No alcohol     ___ Return to work/school ________    Special Instructions:    My questions have been answered and I understand these instructions. I will adhere to these goals and the provided educational materials after my discharge from the hospital.  Patient/Caregiver Signature _______________________________ Date __________  Clinician Signature _______________________________________ Date __________  Please bring this form and your medication list with you to all your follow-up doctor's appointments.

## 2023-07-09 NOTE — Evaluation (Signed)
 Physical Therapy Assessment and Plan  Patient Details  Name: [name] MRN: [id number] Date of Birth: [birth year]  PT Diagnosis: Abnormal posture, Abnormality of gait, Cognitive deficits, Difficulty walking, Impaired cognition, Muscle weakness, and Pain in bilateral knees Rehab Potential: Fair ELOS: 12-14 days   Today's Date: 07/09/2023 PT Individual Time: 7829-5621 PT Individual Time Calculation (min): 72 min    Hospital Problem: Principal Problem:   Debility   Past Medical History:  Past Medical History:  Diagnosis Date   Abdominal discomfort    Chronic N/V/D. Presumptive dx Crohn's dx per elevated p ANCA. Failed Entocort and Pentasa. Sep 2003 - ileocolectomy c anastomosis per Dr Duwaine Gins 2/2 adhesions - path was hegative for Crohns. EGD, Sm bowel follow through (11/03), and an eteroclysis (10/03) were unrevealing. Cuases hypomag and hypocalcemia.   Adnexal mass 10/2001   s/p lap BSO (R ovarian fibroma) & lysis of adhesions   Allergy    Seasonal   Anemia    Multifactorial. Baseline HgB 10-11 ish. B12 def - 150 in 3/10. Fe Def - ferritin 35 3/10. Both are being repleted.   Breast cancer (HCC) 03/16/2013   right, 5 o'clock   CAD (coronary artery disease) 1996   1996 - PTCA and angioplasty diagonal branch. 2000 - Rotoblator & angiopllasty of diagonal. 2006 - subendocardial AMI, DES to proximal LAD.Aaron Aas Also had 90% stenosis in distal apical LAD. EF 55 with apical hypokinesis. Indefinite ASA and Plavix .   CHF (congestive heart failure) (HCC)    Chronic kidney disease, stage 4 (severe) (HCC)    Chronic pain    CT 10/10 = Spinal stenosis L2 - S1.   COVID-19 11/02/2020   Diabetes mellitus    Insulin  dependent   Diabetes mellitus type 2 in obese 05/03/2010   Managed on lantus  and novolog . Has diabetic nephropathy. Metformin D/C'd 2012 2/2 creatinine.  No diabetic retinopathy per 3/11.    Gout    Hx of radiation therapy 06/02/13- 07/16/13   right rbeast 4500 cGy 25 sessions, right  breast boost 1600 cGy in 8 sessions   Hyperlipidemia    Managed with both a statin and Welchol . Welchol  stopped 2014 2/2 cost and started on fenofibrate     Hypertension    2006 B renal arteries patent. 2003 MRA - no RAS. 2003 pheo W/U Dr Francetta Innocent reportedly negative.   Hypoxia 07/23/2017   Lupus    Lymphedema of breast    Personal history of radiation therapy 2015   Pneumonia of left lung due to infectious organism    RBBB    Renal vein thrombosis (HCC)    SBO (small bowel obstruction) (HCC) 09/17/2017   Secondary hyperparathyroidism (HCC)    Stroke [facility])    Incidental finding MRI 2002 L lacunar infarct   Vitamin B12 deficiency    Vitamin D  deficiency    Wears dentures    top   Past Surgical History:  Past Surgical History:  Procedure Laterality Date   ABDOMINAL HYSTERECTOMY     ARTERY REPAIR Left 07/01/2023   Procedure: REPAIR, ARTERY, BRACHIAL USING VEIN PATCH;  Surgeon: Young Hensen, MD;  Location: [facility] OR;  Service: Vascular;  Laterality: Left;   AVGG REMOVAL Left 07/01/2023   Procedure: REMOVAL OF ARTERIOVENOUS GORETEX GRAFT (AVGG);  Surgeon: Young Hensen, MD;  Location: [facility] OR;  Service: Vascular;  Laterality: Left;  LEFT ARM   BILATERAL SALPINGOOPHORECTOMY  8/03   Lap BSO (R ovarian fibroma) and adhesion lysis   BIOPSY  09/08/2020   Procedure:  BIOPSY;  Surgeon: Lanita Pitman, MD;  Location: [facility] ENDOSCOPY;  Service: Endoscopy;;   BOWEL RESECTION  2003   ileocolectomy with anastomosis 2/2 adhesions   BREAST BIOPSY Right 2015   BREAST BIOPSY Right 07/2014   BREAST LUMPECTOMY Right 04/22/2013   BREAST LUMPECTOMY WITH NEEDLE LOCALIZATION AND AXILLARY SENTINEL LYMPH NODE BX Right 04/22/2013   Procedure: BREAST LUMPECTOMY WITH NEEDLE LOCALIZATION AND AXILLARY SENTINEL LYMPH NODE BX;  Surgeon: Lockie Rima, MD;  Location: [facility];  Service: General;  Laterality: Right;   CARDIAC CATHETERIZATION     2 stents   CHOLECYSTECTOMY     COLONOSCOPY      COLONOSCOPY N/A 05/30/2023   Procedure: COLONOSCOPY;  Surgeon: Felecia Hopper, MD;  Location: [facility] END[facility]COPY;  Service: Gastroenterology;  Laterality: N/A;   ESOPHAGOGASTRODUODENOSCOPY N/A 09/08/2020   Procedure: ESOPHAGOGASTRODUODENOSCOPY (EGD);  Surgeon: Lanita Pitman, MD;  Location: [facility];  Service: Endoscopy;  Laterality: N/A;   ESOPHAGOGASTRODUODENOSCOPY N/A 05/30/2023   Procedure: EGD (ESOPHAGOGASTRODUODENOSCOPY);  Surgeon: Felecia Hopper, MD;  Location: [facility]COPY;  Service: Gastroenterology;  Laterality: N/A;   HEMICOLECTOMY     R sided hemicolectomy   HERNIA REPAIR     Ventral hernia repair   INSERTION OF ARTERIOVENOUS (AV) ARTEGRAFT ARM Left 06/21/2023   Procedure: INSERTION, GRAFT, ARTERIOVENOUS, UPPER EXTREMITY;  Surgeon: Adine Hoof, MD;  Location: [facility] OR;  Service: Vascular;  Laterality: Left;   INSERTION OF DIALYSIS CATHETER Right 06/21/2023   Procedure: INSERTION OF DIALYSIS CATHETER;  Surgeon: Adine Hoof, MD;  Location: [facility] OR;  Service: Vascular;  Laterality: Right;   PTCA  4/06    Assessment & Plan Clinical Impression: Patient is a 78 y.o. year old female with history of breast cancer, HTN, hypoxia, PNA, stroke, vitamin D  & B12 deficiency, CAD, chronic LBP,  Lupus, Renal artery thrombosis- on Eliquis , T2DM with nephropathy,  recent admission on 03/18-3/15/25 for gastritis,  acute on chronic diarrhea and discussion regarding progression to ESRD/need for HD but patient hesitant and close follow up recommended. She was admitted on 06/11/23 with progressive SOB with weight gain,  HF exacerbation and attempts at IV diuresis ineffective and required HD. Internal jugular and left brachial artery to axillary PTFE AV graft placed by Dr. Vikki Graves on 04/04.  She developed mild steal syndrome with numbness right hand. She developed fever  with leucocytosis and new erythema L-AC site felt to  be graft reaction v/s infection and was  treated with Keflex  04/07-  04/16.  Due to ongoing issues with pain secondary to steal syndrome, LUE AV graft removed with vein patch angioplasty on 04/14.    She started having rise in WBC with low grade fevers on 04/15 and antibiotics changed to Zosyn  on 04/17 but continued to have fevers with with rise in WBC therefore Vancomycin  added 04/19 and repeat BC X 2 negative but pending.  She has been requiring IV dilaudid  and transitioned to zanafex and Vicodin. MRI LUE done yesterday revealing mild to moderate elbow joint effusion with common flexor tendinosis and possibility of inflammatory phlegmon 1.8 X 0.5 X 1.5 cm just anterior to muscle at biceps musculotendionsis junction question developing abscess.  VVS recommends continuing antibiotic therapy and Dr.Arrien recommended changing Zosyn  to Augmentin  to continue 10 day course of treatment. Medical issues optimized but she continues to be limited by debility, pain, impaired cognition and requires mod to max assist with ADLs and min to mod assist with use of left PW for mobility. She was independent for mobility with  use of rollater PTA.CIR recommended due to functional decline.   Patient currently requires mod with mobility secondary to muscle weakness, decreased cardiorespiratoy endurance, impaired timing and sequencing, decreased coordination, and decreased motor planning, decreased awareness, decreased problem solving, decreased safety awareness, and decreased memory, and decreased standing balance, decreased postural control, and decreased balance strategies.  Prior to hospitalization, patient was independent  with mobility and lived with Son, Daughter (daughter in Social worker) in a House home.  Home access is 4Stairs to enter.  Patient will benefit from skilled PT intervention to maximize safe functional mobility, minimize fall risk, and decrease caregiver burden for planned discharge home with 24 hour supervision.  Anticipate patient will benefit from follow up HH at discharge.  PT -  End of Session Activity Tolerance: Tolerates 30+ min activity with multiple rests Endurance Deficit: Yes Endurance Deficit Description: required rest breaks with minimal activity PT Assessment Rehab Potential (ACUTE/IP ONLY): Fair PT Barriers to Discharge: Inaccessible home environment;Decreased caregiver support;Home environment access/layout;Hemodialysis;Other (comments) PT Barriers to Discharge Comments: dementia, unsure of home set up or D/C plan due to pt's dementia - pt reports 4 STE with 2 handrail and states she lives with son and dtr in law who work and she will be alone sometimes during the day. PT Patient demonstrates impairments in the following area(s): Balance;Edema;Endurance;Motor;Nutrition;Pain;Perception;Safety;Skin Integrity PT Transfers Functional Problem(s): Bed Mobility;Bed to Chair;Car;Furniture PT Locomotion Functional Problem(s): Ambulation;Wheelchair Mobility;Stairs PT Plan PT Intensity: Minimum of 1-2 x/day ,45 to 90 minutes PT Frequency: 5 out of 7 days PT Duration Estimated Length of Stay: 12-14 days PT Treatment/Interventions: Ambulation/gait training;Discharge planning;Functional mobility training;Therapeutic Activities;Balance/vestibular training;Disease management/prevention;Neuromuscular re-education;Skin care/wound management;Therapeutic Exercise;Wheelchair propulsion/positioning;Cognitive remediation/compensation;DME/adaptive equipment instruction;Pain management;Splinting/orthotics;UE/LE Strength taining/ROM;Community reintegration;Patient/family education;Stair training;UE/LE Coordination activities PT Transfers Anticipated Outcome(s): supervision with LRAD PT Locomotion Anticipated Outcome(s): supervision with LRAD PT Recommendation Follow Up Recommendations: Home health PT Patient destination: Home Equipment Recommended: To be determined Equipment Details: need to confirm DME at home   PT Evaluation Precautions/Restrictions Precautions Precautions:  Fall Precaution/Restrictions Comments: dementia Restrictions Weight Bearing Restrictions Per Provider Order: No Pain Interference Pain Interference Pain Effect on Sleep: 1. Rarely or not at all Pain Interference with Therapy Activities: 1. Rarely or not at all Pain Interference with Day-to-Day Activities: 1. Rarely or not at all Home Living/Prior Functioning Home Living Available Help at Discharge: Family;Available PRN/intermittently (pt reports there will be times when she is home alone) Type of Home: House Home Access: Stairs to enter [employer] of Steps: 4 Entrance Stairs-Rails: Right;Left;Can reach both Home Layout: Two level;1/2 bath on main level;Able to live on main level with bedroom/bathroom Alternate Level Stairs-Number of Steps: flight Alternate Level Stairs-Rails: Right;Left;Can reach both Bathroom Shower/Tub: Tub/shower unit Bathroom Accessibility: Yes Additional Comments: pt reports being independent without AD. Pt with hx of dementia - need to confirm information with family.  Lives With: Son;Daughter (daughter in law) Prior Function Level of Independence: Independent with basic ADLs;Independent with transfers;Independent with homemaking with ambulation;Independent with gait  Able to Take Stairs?: Yes Driving: No (hasn't driven in 2 months) Vision/Perception  Vision - History Ability to See in Adequate Light: 0 Adequate Perception Perception: Within Functional Limits  Cognition Overall Cognitive Status: History of cognitive impairments - at baseline Arousal/Alertness: Awake/alert Orientation Level: Oriented to person;Oriented to situation;Oriented to place;Disoriented to time Memory: Impaired Awareness: Impaired Problem Solving: Impaired Safety/Judgment: Impaired Sensation Sensation Light Touch: Appears Intact Hot/Cold: Not tested Proprioception: Appears Intact Stereognosis: Not tested Coordination Gross Motor Movements are Fluid and  Coordinated: No Fine Motor  Movements are Fluid and Coordinated: Yes Coordination and Movement Description: grossly uncoordinated due to pain, impaired motor planning/sequencing, and decreased balance/coordination. Finger Nose Finger Test: [facility] but slow Heel Shin Test: WFL on LLE, unable to lift RLE due to weakness Motor  Motor Motor: Abnormal postural alignment and control Motor - Skilled Clinical Observations: grossly uncoordinated due to pain, impaired motor planning/sequencing, and decreased balance/coordination.  Trunk/Postural Assessment  Cervical Assessment Cervical Assessment: Exceptions to [facility] (forward head) Thoracic Assessment Thoracic Assessment: Exceptions to [facility] (thoracic rounding) Lumbar Assessment Lumbar Assessment: Exceptions to [facility] (posterior pelvic tilt) Postural Control Postural Control: Deficits on evaluation Righting Reactions: delayed Protective Responses: delayed  Balance Balance Balance Assessed: Yes Static Sitting Balance Static Sitting - Balance Support: Feet supported;Bilateral upper extremity supported Static Sitting - Level of Assistance: 5: Stand by assistance (supervision) Dynamic Sitting Balance Dynamic Sitting - Balance Support: Feet supported;No upper extremity supported Dynamic Sitting - Level of Assistance: 5: Stand by assistance (supervision) Static Standing Balance Static Standing - Balance Support: No upper extremity supported;During functional activity Static Standing - Level of Assistance: 4: Min assist Dynamic Standing Balance Dynamic Standing - Balance Support: No upper extremity supported;During functional activity Dynamic Standing - Level of Assistance: 3: Mod assist Dynamic Standing - Comments: with transfers and gait without AD Extremity Assessment  RLE Assessment RLE Assessment: Exceptions to [facility] General Strength Comments: tested sitting EOB RLE Strength Right Hip Flexion: 3+/5 Right Hip ABduction: 3+/5 Right Hip ADduction:  3+/5 Right Knee Flexion: 3+/5 Right Knee Extension: 3-/5 Right Ankle Dorsiflexion: 3+/5 Right Ankle Plantar Flexion: 3+/5 LLE Assessment LLE Assessment: Exceptions to [facility] General Strength Comments: tested sitting EOB LLE Strength Left Hip Flexion: 3+/5 Left Hip ABduction: 3+/5 Left Hip ADduction: 3+/5 Left Knee Flexion: 3+/5 Left Knee Extension: 3+/5 Left Ankle Dorsiflexion: 4-/5 Left Ankle Plantar Flexion: 4-/5  Care Tool Care Tool Bed Mobility Roll left and right activity   Roll left and right assist level: Supervision/Verbal cueing    Sit to lying activity        Lying to sitting on side of bed activity   Lying to sitting on side of bed assist level: the ability to move from lying on the back to sitting on the side of the bed with no back support.: Supervision/Verbal cueing     Care Tool Transfers Sit to stand transfer   Sit to stand assist level: Moderate Assistance - Patient 50 - 74%    Chair/bed transfer   Chair/bed transfer assist level: Moderate Assistance - Patient 50 - 74%    Car transfer   Car transfer assist level: Moderate Assistance - Patient 50 - 74%      Care Tool Locomotion Ambulation   Assist level: Moderate Assistance - Patient 50 - 74% Assistive device: Other (comment) (L handrail and R HHA) Max distance: 37ft  Walk 10 feet activity Walk 10 feet activity did not occur: Safety/medical concerns (pain)       Walk 50 feet with 2 turns activity Walk 50 feet with 2 turns activity did not occur: Safety/medical concerns (pain)      Walk 150 feet activity Walk 150 feet activity did not occur: Safety/medical concerns (pain)      Walk 10 feet on uneven surfaces activity Walk 10 feet on uneven surfaces activity did not occur: Safety/medical concerns (pain)      Stairs Stair activity did not occur: Safety/medical concerns (pain)        Walk up/down 1 step activity Walk up/down 1 step or  curb (drop down) activity did not occur: Safety/medical  concerns (pain)      Walk up/down 4 steps activity Walk up/down 4 steps activity did not occur: Safety/medical concerns (pain)      Walk up/down 12 steps activity Walk up/down 12 steps activity did not occur: Safety/medical concerns (pain)      Pick up small objects from floor Pick up small object from the floor (from standing position) activity did not occur: Safety/medical concerns (pain)      Wheelchair Is the patient using a wheelchair?: Yes Type of Wheelchair: Manual   Wheelchair assist level: Dependent - Patient 0% Max wheelchair distance: >152ft  Wheel 50 feet with 2 turns activity   Assist Level: Dependent - Patient 0%  Wheel 150 feet activity   Assist Level: Dependent - Patient 0%    Refer to Care Plan for Long Term Goals  SHORT TERM GOAL WEEK 1 PT Short Term Goal 1 (Week 1): pt will transfer bed<>chair with LRAD and CGA PT Short Term Goal 2 (Week 1): pt will transfer sit<>stand with LRAD and CGA PT Short Term Goal 3 (Week 1): pt will ambulate 61ft with LRAD and CGA  Recommendations for other services: None   Skilled Therapeutic Intervention Evaluation completed (see details above and below) with education on PT POC and goals and individual treatment initiated with focus on functional mobility/transfers, generalized strengthening and endurance, dynamic standing balance/coordination, simulated car transfers, and ambulation, Received pt supine in bed on bedpan - pt rolled L with supervision using bedrails for therapist to remove bedpan (ultimately unsuccessful). Pt educated on PT evaluation, CIR policies, and therapy schedule and agreeable. Pt denied any pain initially and RN arrived to administer medications. Provided pt with 18x18 manual WC and RW. Upon returning pt's son in law briefly present at bedside and Nephrology present reporting change in HD schedule to Tues/Thurs/Sat - scheduling notified. Pt transferred supine<>sitting R EOB from flat bed with bedrails with close  supervision and cues for technique. Donned second gown, then attempted to stand from EOB without AD however pt unable to sequence proper hand placement and stating "I can't". Provided pt with RW and pt stood from EOB with RW and mod A and performed stand<>pivot into WC with RW and min A. Pt sat in WC at sink and washed face with setup assist. Stood at sink with light mod A and donned clean brief in standing with max A.   Pt transported to/from room in [facility] dependently for time management purposes. Stood from [facility] in hallway with mod A (pt opting to pull up with BUE support on railing despite cues), and ambulated 70ft with R HHA and L handrail and mod A - limited by knee pain. Pt then performed simulated car transfer without AD and mod A with cues for technique and safety. Stood from [facility] with RW and min A and ambulated additional 75ft with RW and min A with close WC follow - again limited by bilateral knee pain and deferred any further standing. Returned to room and provided pt with heat packs for knees (per RN, pt already up to date on pain medication). Concluded session with pt sitting in WC, needs within reach, and seatbelt alarm on. Safety plan updated.   Mobility Bed Mobility Bed Mobility: Rolling Left;Rolling Right;Supine to Sit Rolling Right: Supervision/verbal cueing Rolling Left: Supervision/Verbal cueing Supine to Sit: Supervision/Verbal cueing Transfers Transfers: Sit to Stand;Stand to Sit;Stand Pivot Transfers Sit to Stand: Moderate Assistance - Patient 50-74% Stand to Sit:  Minimal Assistance - Patient > 75% Stand Pivot Transfers: Moderate Assistance - Patient 50 - 74% Stand Pivot Transfer Details: Verbal cues for sequencing;Verbal cues for precautions/safety;Verbal cues for technique Stand Pivot Transfer Details (indicate cue type and reason): verbal cues for sequencing and safety Transfer (Assistive device): None Locomotion  Gait Ambulation: Yes Gait Assistance: Moderate Assistance -  Patient 50-74% Gait Distance (Feet): 2 Feet Assistive device: Other (Comment) (L handrail and R HHA) Gait Assistance Details: Verbal cues for gait pattern;Verbal cues for technique;Verbal cues for sequencing Gait Assistance Details: verbal cues for hand placement, upright posture/gaze, and to extend knees. Gait Gait: Yes Gait Pattern: Impaired Gait Pattern: Step-to pattern;Decreased step length - right;Decreased step length - left;Decreased stride length;Antalgic;Trunk flexed;Poor foot clearance - left;Poor foot clearance - right;Narrow base of support Gait velocity: decreased Stairs / Additional Locomotion Stairs: No Corporate treasurer: Yes Wheelchair Assistance: Dependent - Patient 0% Wheelchair Parts Management: Needs assistance Distance: >123ft   Discharge Criteria: Patient will be discharged from PT if patient refuses treatment 3 consecutive times without medical reason, if treatment goals not met, if there is a change in medical status, if patient makes no progress towards goals or if patient is discharged from hospital.  The above assessment, treatment plan, treatment alternatives and goals were discussed and mutually agreed upon: by patient  Ricardo Schubach M Zaunegger Nyeshia Mysliwiec Zaunegger PT, DPT 07/09/2023, 10:16 AM

## 2023-07-09 NOTE — Progress Notes (Signed)
 Occupational Therapy Session Note  Patient Details  Name: [name] MRN: [id number] Date of Birth: [birth year]  Today's Date: 07/09/2023 OT Individual Time: 1914-7829 OT Individual Time Calculation (min): 71 min    Short Term Goals: Week 1:  OT Short Term Goal 1 (Week 1): Pt will complete sit > stand in prep for ADL with CGA using LRAD OT Short Term Goal 2 (Week 1): Pt will complete toilet transfer with CGA using LRAD OT Short Term Goal 3 (Week 1): Pt will initiate toileting need with min questioning cues OT Short Term Goal 4 (Week 1): Pt will complete 1/3 toileting steps with min A for balance  Skilled Therapeutic Interventions/Progress Updates:  Skilled OT intervention completed with focus on ADL retraining, functional transfers, activity tolerance, sit > stands. Pt received seated in w/c, agreeable to session. 7/10 pain reported in bilateral knees and hands as well as lower back; pre-medicated. OT offered rest breaks and repositioning throughout for pain reduction.  Pt slow to arouse and reorient. OT initiated toileting opportunity to manage incontinence. Transported dependently in w/c > BSC over toilet. Pt stood with heavy mod A using grab bar, then pt with difficulty maintaining upright stance to advance legs. Pt required max cues needed for sequencing and hand placement, then completed heavy mod A stand pivot > toilet. Total A for lowering clothing. Pt was incontinent of BM and further continent of urinary void and BM. Pt with increased discomfort sitting on toilet, however unaware of continued BM when trying to wipe. Unable to leave pt unattended for period of time due to pt fidgeting from pain and mild impulsivity with multiple attempts to stand without assist. Pillow offered behind back for increased comfort during toileting. Pt able to wipe posteriorly while leaning anteriorly with supervision, however assist needed to ensure cleanliness. Threaded brief total A, and staff  retrieved stedy for toilet transfer due to BLE fatigue/weakness and pain. Pt required mod A to stand using stedy, and total A for donning pants over hips with poor ability to maintain/follow command for hip extension. Dependent transfer in stedy > w/c with again min A needed to stand from perched position.   Safety plan updated to reflect recommendation of stedy for toileting with staff for safety. Supervision for hand hygiene. Transported dependently in w/c <> gym. Seated at table top, pt completed the following activities to promote LUE ROM in a pain free zone needed for functional use with BADLs:  Table slides on LUE (2x20) -shoulder flexion -horizontal abduction  Back in room, utilized stedy for mod A sit > stand, dependent transfer to bed, then from perched position light min A to stand however tactile cues needed for hip extension. Demo offered to pt on difference between semi squat and full hip extension. Pt required min A for BLE into bed and cues for midline placement once supine. Pt remained semi upright in bed, with bed alarm on/activated, and with all needs in reach at end of session.   Therapy Documentation Precautions:  Precautions Precautions: Fall Precaution/Restrictions Comments: dementia Restrictions Weight Bearing Restrictions Per Provider Order: No    Therapy/Group: Individual Therapy  Ruthanna Covert, MS, OTR/L  07/09/2023, 3:38 PM

## 2023-07-09 NOTE — Progress Notes (Signed)
 Inpatient Rehabilitation  Patient information reviewed and entered into e[facility] system by Feliberto Gottron, M.A., CCC-SLP, Rehab Quality Coordinator.  Information including medical coding, functional ability and quality indicators will be reviewed and updated through discharge.

## 2023-07-09 NOTE — Progress Notes (Addendum)
 Patient ID: [name], female   DOB: Sep 12, 1945, 78 y.o.   MRN: [id number] S: patient feels well today with no complaints.  Tolerated dialysis yesterday with new issues  O:BP (!) 157/65 (BP Location: Left Arm)   Pulse 89   Temp 99.2 F (37.3 C) (Oral)   Resp 17   Ht 5\' 4"  (1.626 m)   Wt 75.3 kg   SpO2 100%   BMI 28.49 kg/m   Intake/Output Summary (Last 24 hours) at 07/09/2023 1305 Last data filed at 07/09/2023 0900 Gross per 24 hour  Intake 180 ml  Output 1 ml  Net 179 ml   Intake/Output: I/O last 3 completed shifts: In: 60 [P.O.:60] Out: 1 [Stool:1]  Intake/Output this shift:  Total I/O In: 120 [P.O.:120] Out: -  Weight change:  Gen: NAD lying in bed CVS: Normal rate, no rub Resp: Bilateral chest rise with no increased work of breathing Abd: +BS, soft, NT/ND Ext: no edema, warm and well-perfused  Recent Labs  Lab 07/03/23 0507 07/05/23 0512 07/07/23 0759 07/08/23 0519 07/09/23 0505  NA 135 135 135 132* 132*  K 4.4 3.8 4.6 4.4 4.2  CL 101 96* 97* 93* 95*  CO2 21* 22 19* 19* 23  GLUCOSE 119* 98 110* 107* 84  BUN 68* 58* 58* 69* 36*  CREATININE 4.81* 5.03* 5.56* 6.54* 4.66*  ALBUMIN  2.0* 2.0* 2.1* 1.9* 2.1*  CALCIUM  8.5* 9.1 9.4 8.8* 9.4  PHOS 5.1* 4.8* 7.1* 7.5* 6.7*   Liver Function Tests: Recent Labs  Lab 07/07/23 0759 07/08/23 0519 07/09/23 0505  ALBUMIN  2.1* 1.9* 2.1*   No results for input(s): "LIPASE", "AMYLASE" in the last 168 hours. No results for input(s): "AMMONIA" in the last 168 hours. CBC: Recent Labs  Lab 07/05/23 0512 07/06/23 0554 07/07/23 0759 07/08/23 0519 07/09/23 0505  WBC 11.7* 12.7* 11.2* 10.5 11.9*  NEUTROABS  --   --  8.8* 8.5*  --   HGB 7.7* 8.3* 8.5* 8.0* 8.6*  HCT 24.7* 26.0* 28.0* 25.5* 27.8*  MCV 91.8 91.9 94.0 92.1 92.1  PLT 252 246 276 296 348   Cardiac Enzymes: No results for input(s): "CKTOTAL", "CKMB", "CKMBINDEX", "TROPONINI" in the last 168 hours. CBG: Recent Labs  Lab 07/07/23 2109  07/08/23 1312 07/08/23 1759 07/09/23 0634 07/09/23 1213  GLUCAP 151* 128* 144* 83 165*    Iron  Studies: No results for input(s): "IRON ", "TIBC", "TRANSFERRIN", "FERRITIN" in the last 72 hours. Studies/Results: No results found.   amoxicillin -clavulanate  1 tablet Oral Q24H   apixaban   5 mg Oral BID   Chlorhexidine  Gluconate Cloth  6 each Topical Q12H   colestipol   1 g Oral TID   [START ON 07/14/2023] darbepoetin (ARANESP ) injection - DIALYSIS  150 mcg Subcutaneous Q Sun-1800   diclofenac  Sodium  2 g Topical QID   FLUoxetine   20 mg Oral Daily   folic acid   1 mg Oral Daily   gabapentin   300 mg Oral QHS   lipase/protease/amylase  36,000 Units Oral TID AC   mirtazapine   7.5 mg Oral QHS   multivitamin  1 tablet Oral QHS   pantoprazole   40 mg Oral BID   rivastigmine   1.5 mg Oral BID   sevelamer  carbonate  1,600 mg Oral TID WC   [START ON 07/10/2023] torsemide   100 mg Oral Q M,W,F   And   [START ON 07/14/2023] torsemide   100 mg Oral Q Sun    BMET    Component Value Date/Time   NA 132 (L) 07/09/2023 0505  NA 144 07/19/2020 1458   NA 140 03/14/2017 0810   K 4.2 07/09/2023 0505   K 3.8 03/14/2017 0810   CL 95 (L) 07/09/2023 0505   CO2 23 07/09/2023 0505   CO2 27 03/14/2017 0810   GLUCOSE 84 07/09/2023 0505   GLUCOSE 177 (H) 03/14/2017 0810   BUN 36 (H) 07/09/2023 0505   BUN 85 (HH) 07/19/2020 1458   BUN 35.6 (H) 03/14/2017 0810   CREATININE 4.66 (H) 07/09/2023 0505   CREATININE 2.31 (H) 10/25/2022 1355   CREATININE 1.8 (H) 03/14/2017 0810   CALCIUM  9.4 07/09/2023 0505   CALCIUM  8.3 (L) 06/18/2023 0334   CALCIUM  9.6 03/14/2017 0810   GFRNONAA 9 (L) 07/09/2023 0505   GFRNONAA 22 (L) 10/25/2022 1355   GFRNONAA 45 (L) 12/17/2013 1158   GFRAA 21 (L) 12/21/2019 0845   GFRAA 52 (L) 12/17/2013 1158   CBC    Component Value Date/Time   WBC 11.9 (H) 07/09/2023 0505   RBC 3.02 (L) 07/09/2023 0505   HGB 8.6 (L) 07/09/2023 0505   HGB 9.4 (L) 04/18/2023 1434   HGB 9.3 (L)  03/14/2017 0810   HCT 27.8 (L) 07/09/2023 0505   HCT 26.4 (L) 07/21/2019 0809   HCT 29.2 (L) 03/14/2017 0810   PLT 348 07/09/2023 0505   PLT 235 04/18/2023 1434   PLT 292 03/14/2017 0810   PLT 351 10/11/2016 1139   MCV 92.1 07/09/2023 0505   MCV 81.3 03/14/2017 0810   MCH 28.5 07/09/2023 0505   MCHC 30.9 07/09/2023 0505   RDW 16.5 (H) 07/09/2023 0505   RDW 14.0 03/14/2017 0810   LYMPHSABS 1.2 07/08/2023 0519   LYMPHSABS 1.4 03/14/2017 0810   MONOABS 0.6 07/08/2023 0519   MONOABS 0.4 03/14/2017 0810   EOSABS 0.1 07/08/2023 0519   EOSABS 0.1 03/14/2017 0810   BASOSABS 0.0 07/08/2023 0519   BASOSABS 0.0 03/14/2017 0810    HPI:  Pt is a 78 y.o.  with  hypertension, renal vein thrombosis on Eliquis , stroke, type II DM, lupus, CKD 4-5 who was recently admitted for CHF exacerbation and progressive CKD presented back with worsening dyspnea on exertion and fluid overload.      Assessment/Plan:   # Progressive CKD 5 versus AKI on CKD,: With frequent hospitalization for fluid overload.  Responding to IV diuretics but not po and with worsening renal function d/w patient and family (Daughter on phone 4/2) at length recommend starting dialysis. S/p LUE AVG + TDC placement 4/4  - On MWF schedule -> switch to TTS while at IR. Next HD thursday - CLIP complete-  has been accepted at [facility] - AVG removed as below   # CKD-MBD/hyperphosphatemia: Increased sevelamer  on 4/21. CTM. PTH at goal. Repeat on 5/1   #Anemia of CKD: Concern about GI bleed, underwent endoscopy t recently with no sign of bleeding.  Back on Eliquis .  Has required transfusion.  Given IV iron  and ESA   # Hypertension/volume: [name] improved after diuresis.  Torsemide  on non-dialysis days   #A fib: on eliquis    # Vascular access - developed steal syndrome of left hand. - VVS following s/p excision of AVG 07/01/23 due to concern for steal syndrome with improvement of symptoms and ok to resume anticoagulation. Also has  possible abscess in LUE on abx and followed by surgery   #Disposition - CIR today

## 2023-07-09 NOTE — Evaluation (Signed)
 Occupational Therapy Assessment and Plan  Patient Details  Name: [name] MRN: [id number] Date of Birth: [birth year]  OT Diagnosis: abnormal posture, acute pain, cognitive deficits, muscle weakness (generalized), pain in joint, and swelling of limb Rehab Potential: Rehab Potential (ACUTE ONLY): Good ELOS: 12-14 days   Today's Date: 07/09/2023 OT Individual Time: 1105-1200 OT Individual Time Calculation (min): 55 min     Hospital Problem: Principal Problem:   Debility   Past Medical History:  Past Medical History:  Diagnosis Date   Abdominal discomfort    Chronic N/V/D. Presumptive dx Crohn's dx per elevated p ANCA. Failed Entocort and Pentasa. Sep 2003 - ileocolectomy c anastomosis per Dr [name] 2/2 adhesions - path was hegative for Crohns. EGD, Sm bowel follow through (11/03), and an eteroclysis (10/03) were unrevealing. Cuases hypomag and hypocalcemia.   Adnexal mass 10/2001   s/p lap BSO (R ovarian fibroma) & lysis of adhesions   Allergy    Seasonal   Anemia    Multifactorial. Baseline HgB 10-11 ish. B12 def - 150 in 3/10. Fe Def - ferritin 35 3/10. Both are being repleted.   Breast cancer (HCC) 03/16/2013   right, 5 o'clock   CAD (coronary artery disease) 1996   1996 - PTCA and angioplasty diagonal branch. 2000 - Rotoblator & angiopllasty of diagonal. 2006 - subendocardial AMI, DES to proximal LAD.[name] Also had 90% stenosis in distal apical LAD. EF 55 with apical hypokinesis. Indefinite ASA and Plavix .   CHF (congestive heart failure) (HCC)    Chronic kidney disease, stage 4 (severe) (HCC)    Chronic pain    CT 10/10 = Spinal stenosis L2 - S1.   COVID-19 11/02/2020   Diabetes mellitus    Insulin  dependent   Diabetes mellitus type 2 in obese 05/03/2010   Managed on lantus  and novolog . Has diabetic nephropathy. Metformin D/C'd 2012 2/2 creatinine.  No diabetic retinopathy per 3/11.    Gout    Hx of radiation therapy 06/02/13- 07/16/13   right rbeast 4500 cGy 25  sessions, right breast boost 1600 cGy in 8 sessions   Hyperlipidemia    Managed with both a statin and Welchol . Welchol  stopped 2014 2/2 cost and started on fenofibrate     Hypertension    2006 B renal arteries patent. 2003 MRA - no RAS. 2003 pheo W/U Dr [name] reportedly negative.   Hypoxia 07/23/2017   Lupus    Lymphedema of breast    Personal history of radiation therapy 2015   Pneumonia of left lung due to infectious organism    RBBB    Renal vein thrombosis (HCC)    SBO (small bowel obstruction) (HCC) 09/17/2017   Secondary hyperparathyroidism (HCC)    Stroke [name])    Incidental finding MRI 2002 L lacunar infarct   Vitamin B12 deficiency    Vitamin D  deficiency    Wears dentures    top   Past Surgical History:  Past Surgical History:  Procedure Laterality Date   ABDOMINAL HYSTERECTOMY     ARTERY REPAIR Left 07/01/2023   Procedure: REPAIR, ARTERY, BRACHIAL USING VEIN PATCH;  Surgeon: Katie Hensen, MD;  Location: [facility] OR;  Service: Vascular;  Laterality: Left;   AVGG REMOVAL Left 07/01/2023   Procedure: REMOVAL OF ARTERIOVENOUS GORETEX GRAFT (AVGG);  Surgeon: Katie Hensen, MD;  Location: [facility] OR;  Service: Vascular;  Laterality: Left;  LEFT ARM   BILATERAL SALPINGOOPHORECTOMY  8/03   Lap BSO (R ovarian fibroma) and adhesion lysis   BIOPSY  09/08/2020  Procedure: BIOPSY;  Surgeon: Katie Pitman, MD;  Location: [facility]COPY;  Service: Endoscopy;;   BOWEL RESECTION  2003   ileocolectomy with anastomosis 2/2 adhesions   BREAST BIOPSY Right 2015   BREAST BIOPSY Right 07/2014   BREAST LUMPECTOMY Right 04/22/2013   BREAST LUMPECTOMY WITH NEEDLE LOCALIZATION AND AXILLARY SENTINEL LYMPH NODE BX Right 04/22/2013   Procedure: BREAST LUMPECTOMY WITH NEEDLE LOCALIZATION AND AXILLARY SENTINEL LYMPH NODE BX;  Surgeon: Katie Rima, MD;  Location: [name];  Service: General;  Laterality: Right;   CARDIAC CATHETERIZATION     2 stents   CHOLECYSTECTOMY      COLONOSCOPY     COLONOSCOPY N/A 05/30/2023   Procedure: COLONOSCOPY;  Surgeon: Katie Hopper, MD;  Location: [facility] ENDOSCOPY;  Service: Gastroenterology;  Laterality: N/A;   ESOPHAGOGASTRODUODENOSCOPY N/A 09/08/2020   Procedure: ESOPHAGOGASTRODUODENOSCOPY (EGD);  Surgeon: Katie Pitman, MD;  Location: [facility];  Service: Endoscopy;  Laterality: N/A;   ESOPHAGOGASTRODUODENOSCOPY N/A 05/30/2023   Procedure: EGD (ESOPHAGOGASTRODUODENOSCOPY);  Surgeon: Katie Hopper, MD;  Location: [facility] ENDOSCOPY;  Service: Gastroenterology;  Laterality: N/A;   HEMICOLECTOMY     R sided hemicolectomy   HERNIA REPAIR     Ventral hernia repair   INSERTION OF ARTERIOVENOUS (AV) ARTEGRAFT ARM Left 06/21/2023   Procedure: INSERTION, GRAFT, ARTERIOVENOUS, UPPER EXTREMITY;  Surgeon: Katie Hoof, MD;  Location: [name] OR;  Service: Vascular;  Laterality: Left;   INSERTION OF DIALYSIS CATHETER Right 06/21/2023   Procedure: INSERTION OF DIALYSIS CATHETER;  Surgeon: Katie Hoof, MD;  Location: [facility] OR;  Service: Vascular;  Laterality: Right;   PTCA  4/06    Assessment & Plan Clinical Impression:  [name] is a 78 year old female with history of breast cancer, HTN, hypoxia, PNA, stroke, vitamin D  & B12 deficiency, CAD, chronic LBP,  Lupus, Renal artery thrombosis- on Eliquis , T2DM with nephropathy,  recent admission on 03/18-3/15/25 for gastritis,  acute on chronic diarrhea and discussion regarding progression to ESRD/need for HD but patient hesitant and close follow up recommended. She was admitted on 06/11/23 with progressive SOB with weight gain,  HF exacerbation and attempts at IV diuresis ineffective and required HD. Internal jugular and left brachial artery to axillary PTFE AV graft placed by [name] on 04/04.  She developed mild steal syndrome with numbness right hand. She developed fever  with leucocytosis and new erythema L-AC site felt to  be graft reaction v/s infection and was  treated  with Keflex  04/07- 04/16.  Due to ongoing issues with pain secondary to steal syndrome, LUE AV graft removed with vein patch angioplasty on 04/14.    She started having rise in WBC with low grade fevers on 04/15 and antibiotics changed to Zosyn  on 04/17 but continued to have fevers with with rise in WBC therefore Vancomycin  added 04/19 and repeat BC X 2 negative but pending.  She has been requiring IV dilaudid  and transitioned to zanafex and Vicodin. MRI LUE done yesterday revealing mild to moderate elbow joint effusion with common flexor tendinosis and possibility of inflammatory phlegmon 1.8 X 0.5 X 1.5 cm just anterior to muscle at biceps musculotendionsis junction question developing abscess.  VVS recommends continuing antibiotic therapy and KatieArrien recommended changing Zosyn  to Augmentin  to continue 10 day course of treatment. Medical issues optimized but she continues to be limited by debility, pain, impaired cognition and requires mod to max assist with ADLs and min to mod assist with use of left PW for mobility. She was independent  for mobility with use of rollater PTA.CIR recommended due to functional decline. Patient transferred to CIR on 07/08/2023 .    Patient currently requires max with basic self-care skills secondary to muscle weakness, decreased cardiorespiratoy endurance, decreased coordination and decreased motor planning, decreased awareness, decreased problem solving, decreased safety awareness, decreased memory, and delayed processing, and decreased standing balance.  Prior to hospitalization, patient could complete self-care with mod I.  Patient will benefit from skilled intervention to decrease level of assist with basic self-care skills and increase independence with basic self-care skills prior to discharge home with care partner.  Anticipate patient will require 24 hour supervision and follow up home health.  OT - End of Session Activity Tolerance: Tolerates < 10 min activity, no  significant change in vital signs Endurance Deficit: Yes Endurance Deficit Description: required rest breaks with minimal activity OT Assessment Rehab Potential (ACUTE ONLY): Good OT Barriers to Discharge: Home environment access/layout;Inaccessible home environment;Hemodialysis;Incontinence OT Patient demonstrates impairments in the following area(s): Balance;Cognition;Edema;Endurance;Motor;Pain;Safety OT Basic ADL's Functional Problem(s): Eating;Grooming;Bathing;Dressing;Toileting OT Transfers Functional Problem(s): Toilet;Tub/Shower OT Additional Impairment(s): Fuctional Use of Upper Extremity OT Plan OT Intensity: Minimum of 1-2 x/day, 45 to 90 minutes OT Frequency: 5 out of 7 days OT Duration/Estimated Length of Stay: 12-14 days OT Treatment/Interventions: Balance/vestibular training;Discharge planning;Pain management;Self Care/advanced ADL retraining;Therapeutic Activities;UE/LE Coordination activities;Functional electrical stimulation;Cognitive remediation/compensation;Disease mangement/prevention;Functional mobility training;Patient/family education;Skin care/wound managment;Therapeutic Exercise;DME/adaptive equipment instruction;Neuromuscular re-education;UE/LE Strength taining/ROM;Wheelchair propulsion/positioning OT Self Feeding Anticipated Outcome(s): Supervision OT Basic Self-Care Anticipated Outcome(s): Supervision OT Toileting Anticipated Outcome(s): Supervision OT Bathroom Transfers Anticipated Outcome(s): Supervision, CGA OT Recommendation Recommendations for Other Services: Therapeutic Recreation consult Therapeutic Recreation Interventions: Pet therapy Patient destination: Home Follow Up Recommendations: 24 hour supervision/assistance;Home health OT Equipment Recommended: To be determined   OT Evaluation Precautions/Restrictions  Precautions Precautions: Fall Precaution/Restrictions Comments: dementia Restrictions Weight Bearing Restrictions Per Provider Order:  No Home Living/Prior Functioning Home Living Living Arrangements: Other relatives Available Help at Discharge: Family, Available PRN/intermittently (DIL works 9-12 M-F but have several family members that plan to rotate to provide 24/7 supervision) Type of Home: House Home Access: Stairs to enter [employer] of Steps: 1 STE + 1 STE Entrance Stairs-Rails: None Home Layout: Two level, 1/2 bath on main level, Able to live on main level with bedroom/bathroom Alternate Level Stairs-Number of Steps: flight Alternate Level Stairs-Rails: Right, Left, Can reach both Bathroom Shower/Tub: Tub/shower unit, Engineer, building services: Standard Bathroom Accessibility: Yes Additional Comments: Daughter reports they have rollator, TTB. Has lift chair. Using rollator recently however did not use as much before.  Lives With: Daughter (lives with DIL and grandchildren (19, 2 minors)) IADL History Homemaking Responsibilities: Yes Meal Prep Responsibility: Primary Prior Function Level of Independence: Independent with basic ADLs, Requires assistive device for independence  Able to Take Stairs?: Yes Driving: No (not for 2-3 years) Vision Baseline Vision/History: 0 No visual deficits Ability to See in Adequate Light: 0 Adequate Patient Visual Report: No change from baseline Vision Assessment?: No apparent visual deficits Perception  Perception: Within Functional Limits Praxis Praxis: Impaired Praxis Impairment Details: Motor planning;Organization Cognition Cognition Overall Cognitive Status: History of cognitive impairments - at baseline Arousal/Alertness: Awake/alert Orientation Level: Person;Place;Situation Person: Oriented Place: Oriented Situation: Disoriented Memory: Impaired Awareness: Impaired Problem Solving: Impaired Safety/Judgment: Impaired Brief Interview for Mental Status (BIMS) Repetition of Three Words (First Attempt): 3 Temporal Orientation: Year:  Nonsensical Temporal Orientation: Month: Accurate within 5 days Temporal Orientation: Day: Correct Recall: "Sock": No, could not recall Recall: "Blue": Yes, after cueing ("a color") Recall: "  Bed": No, could not recall BIMS Summary Score: 7 Sensation Sensation Light Touch: Appears Intact Hot/Cold: Not tested Proprioception: Appears Intact Stereognosis: Not tested Coordination Gross Motor Movements are Fluid and Coordinated: No Fine Motor Movements are Fluid and Coordinated: Yes Coordination and Movement Description: grossly uncoordinated due to pain, impaired motor planning/sequencing, and decreased balance/coordination. Finger Nose Finger Test: [name] but slow Heel Shin Test: WFL on LLE, unable to lift RLE due to weakness Motor  Motor Motor: Abnormal postural alignment and control Motor - Skilled Clinical Observations: grossly uncoordinated due to pain, impaired motor planning/sequencing, and decreased balance/coordination.  Trunk/Postural Assessment  Cervical Assessment Cervical Assessment: Exceptions to [name] (forward head) Thoracic Assessment Thoracic Assessment: Exceptions to [facility] (thoracic rounding) Lumbar Assessment Lumbar Assessment: Exceptions to [name] (posterior pelvic tilt) Postural Control Postural Control: Deficits on evaluation Righting Reactions: delayed Protective Responses: delayed  Balance Balance Balance Assessed: Yes Static Sitting Balance Static Sitting - Balance Support: Feet supported;Bilateral upper extremity supported Static Sitting - Level of Assistance: 5: Stand by assistance (supervision) Dynamic Sitting Balance Dynamic Sitting - Balance Support: Feet supported;No upper extremity supported Dynamic Sitting - Level of Assistance: 5: Stand by assistance (supervision) Static Standing Balance Static Standing - Balance Support: No upper extremity supported;During functional activity Static Standing - Level of Assistance: 4: Min assist Dynamic Standing  Balance Dynamic Standing - Balance Support: No upper extremity supported;During functional activity Dynamic Standing - Level of Assistance: 3: Mod assist Dynamic Standing - Comments: with transfers and gait without AD Extremity/Trunk Assessment RUE Assessment RUE Assessment: Exceptions to [name] Active Range of Motion (AROM) Comments: Limited to 90 degrees SF, WFL distally General Strength Comments: 3-/5 grossly LUE Assessment LUE Assessment: Exceptions to [facility] Active Range of Motion (AROM) Comments: Limited to 20 degrees SF, WFL distally but limited by pain General Strength Comments: 3-/5 distally  Care Tool Care Tool Self Care Eating   Eating Assist Level: Minimal Assistance - Patient > 75%    Oral Care    Oral Care Assist Level: Minimal Assistance - Patient > 75%    Bathing   Body parts bathed by patient: Right arm;Left arm;Chest;Abdomen;Face Body parts bathed by helper: Front perineal area;Buttocks;Right upper leg;Right lower leg;Left lower leg;Left upper leg   Assist Level: Maximal Assistance - Patient 24 - 49%    Upper Body Dressing(including orthotics)   What is the patient wearing?: Pull over shirt;Bra   Assist Level: Maximal Assistance - Patient 25 - 49%    Lower Body Dressing (excluding footwear)   What is the patient wearing?: Incontinence brief;Pants Assist for lower body dressing: Maximal Assistance - Patient 25 - 49%    Putting on/Taking off footwear   What is the patient wearing?: Non-skid slipper socks Assist for footwear: Dependent - Patient 0%       Care Tool Toileting Toileting activity   Assist for toileting: Total Assistance - Patient < 25%     Care Tool Bed Mobility Roll left and right activity   Roll left and right assist level: Supervision/Verbal cueing    Sit to lying activity        Lying to sitting on side of bed activity   Lying to sitting on side of bed assist level: the ability to move from lying on the back to sitting on the side of  the bed with no back support.: Supervision/Verbal cueing     Care Tool Transfers Sit to stand transfer   Sit to stand assist level: Moderate Assistance - Patient 50 - 74%  Chair/bed transfer   Chair/bed transfer assist level: Moderate Assistance - Patient 50 - 74%     Toilet transfer   Assist Level: Moderate Assistance - Patient 50 - 74%     Care Tool Cognition  Expression of Ideas and Wants Expression of Ideas and Wants: 3. Some difficulty - exhibits some difficulty with expressing needs and ideas (e.g, some words or finishing thoughts) or speech is not clear  Understanding Verbal and Non-Verbal Content Understanding Verbal and Non-Verbal Content: 3. Usually understands - understands most conversations, but misses some part/intent of message. Requires cues at times to understand   Memory/Recall Ability Memory/Recall Ability : Current season;That he or she is in a hospital/hospital unit   Refer to Care Plan for Long Term Goals  SHORT TERM GOAL WEEK 1 OT Short Term Goal 1 (Week 1): Pt will complete sit > stand in prep for ADL with CGA using LRAD OT Short Term Goal 2 (Week 1): Pt will complete toilet transfer with CGA using LRAD OT Short Term Goal 3 (Week 1): Pt will initiate toileting need with min questioning cues OT Short Term Goal 4 (Week 1): Pt will complete 1/3 toileting steps with min A for balance  Recommendations for other services: Therapeutic Recreation  Pet therapy   Skilled Therapeutic Intervention Patient received seated in w/c with daughter present upon therapy arrival and agreeable to participate in OT evaluation. Education provided on OT purpose, therapy schedule, goals for therapy, and safety policy while in rehab. Unrated pain reported in bilateral knees and bilateral hands; nurse present for pain meds at start of session. OT offered rest breaks, repositioning for additional pain reduction.  Patient demonstrates cognitive (awareness, problem solving, safety),  dynamic standing balance, functional endurance, GM/FM coordination and BUE/bilateral grasp deficits resulting in difficulty completing BADL tasks without increased physical assist. Cues needed throughout for initiation and sequencing. Pt will benefit from skilled OT services to focus on mentioned deficits. See below for ADL and functional transfer performance. ADLs completed at sink side. Education provided to daughter on TTB vs shower chair for tub/shower accessibility however did inform her of inability to give pt a shower in hospital due to policy of no showers with HD port, and to verify with MD prior to DC about her clearance for showering at home from waist down. Discussed infection prevention and precautions. Pt remained seated in w/c at conclusion of session with belt alarm on and all needs met at end of session.    ADL ADL Eating: Minimal assistance Where Assessed-Eating: Wheelchair Grooming: Minimal assistance Where Assessed-Grooming: Wheelchair Upper Body Bathing: Supervision/safety Where Assessed-Upper Body Bathing: Sitting at sink Lower Body Bathing: Maximal assistance Where Assessed-Lower Body Bathing: Sitting at sink;Standing at sink Upper Body Dressing: Maximal assistance Where Assessed-Upper Body Dressing: Wheelchair Lower Body Dressing: Maximal assistance Where Assessed-Lower Body Dressing: Sitting at sink;Standing at sink Toileting: Dependent Where Assessed-Toileting: Bedside Commode Toilet Transfer: Moderate assistance Toilet Transfer Method: Stand pivot Toilet Transfer Equipment: Bedside commode;Grab bars Tub/Shower Transfer: Unable to assess Tub/Shower Transfer Method: Unable to assess Film/video editor: Unable to assess Film/video editor Method: Unable to assess Mobility  Bed Mobility Bed Mobility: Rolling Left;Rolling Right;Supine to Sit Rolling Right: Supervision/verbal cueing Rolling Left: Supervision/Verbal cueing Supine to Sit:  Supervision/Verbal cueing Transfers Sit to Stand: Moderate Assistance - Patient 50-74% Stand to Sit: Minimal Assistance - Patient > 75%   Discharge Criteria: Patient will be discharged from OT if patient refuses treatment 3 consecutive times without medical reason, if treatment goals  not met, if there is a change in medical status, if patient makes no progress towards goals or if patient is discharged from hospital.  The above assessment, treatment plan, treatment alternatives and goals were discussed and mutually agreed upon: by patient and by family  Ruthanna Covert, MS, OTR/L  07/09/2023, 12:21 PM

## 2023-07-10 ENCOUNTER — Telehealth (HOSPITAL_COMMUNITY): Payer: Self-pay | Admitting: Pharmacy Technician

## 2023-07-10 ENCOUNTER — Inpatient Hospital Stay (HOSPITAL_COMMUNITY)

## 2023-07-10 LAB — CBC WITH DIFFERENTIAL/PLATELET
Abs Immature Granulocytes: 0.19 10*3/uL — ABNORMAL HIGH (ref 0.00–0.07)
Basophils Absolute: 0 10*3/uL (ref 0.0–0.1)
Basophils Relative: 0 %
Eosinophils Absolute: 0 10*3/uL (ref 0.0–0.5)
Eosinophils Relative: 0 %
HCT: 26.1 % — ABNORMAL LOW (ref 36.0–46.0)
Hemoglobin: 8.2 g/dL — ABNORMAL LOW (ref 12.0–15.0)
Immature Granulocytes: 2 %
Lymphocytes Relative: 15 %
Lymphs Abs: 1.7 10*3/uL (ref 0.7–4.0)
MCH: 29 pg (ref 26.0–34.0)
MCHC: 31.4 g/dL (ref 30.0–36.0)
MCV: 92.2 fL (ref 80.0–100.0)
Monocytes Absolute: 0.7 10*3/uL (ref 0.1–1.0)
Monocytes Relative: 6 %
Neutro Abs: 9.2 10*3/uL — ABNORMAL HIGH (ref 1.7–7.7)
Neutrophils Relative %: 77 %
Platelets: 357 10*3/uL (ref 150–400)
RBC: 2.83 MIL/uL — ABNORMAL LOW (ref 3.87–5.11)
RDW: 16.6 % — ABNORMAL HIGH (ref 11.5–15.5)
WBC: 11.9 10*3/uL — ABNORMAL HIGH (ref 4.0–10.5)
nRBC: 0.2 % (ref 0.0–0.2)

## 2023-07-10 LAB — GLUCOSE, CAPILLARY: Glucose-Capillary: 157 mg/dL — ABNORMAL HIGH (ref 70–99)

## 2023-07-10 MED ORDER — COLESTIPOL HCL 1 G PO TABS
1.0000 g | ORAL_TABLET | Freq: Three times a day (TID) | ORAL | Status: DC
  Administered 2023-07-10 – 2023-08-01 (×65): 1 g via ORAL
  Filled 2023-07-10 (×69): qty 1

## 2023-07-10 MED ORDER — CHLORHEXIDINE GLUCONATE CLOTH 2 % EX PADS
6.0000 | MEDICATED_PAD | Freq: Every day | CUTANEOUS | Status: DC
  Administered 2023-07-12 – 2023-07-13 (×2): 6 via TOPICAL

## 2023-07-10 NOTE — Progress Notes (Signed)
 Patient ID: [name], female   DOB: 1945-12-06, 78 y.o.   MRN: [id number] S: Patient feels okay today but having right knee pain.  Otherwise no complaints  O:BP (!) 157/65 (BP Location: Left Arm)   Pulse 89   Temp 99.2 F (37.3 C) (Oral)   Resp 17   Ht 5\' 4"  (1.626 m)   Wt 69.3 kg   SpO2 100%   BMI 26.22 kg/m   Intake/Output Summary (Last 24 hours) at 07/10/2023 1052 Last data filed at 07/09/2023 1903 Gross per 24 hour  Intake 180 ml  Output --  Net 180 ml   Intake/Output: I/O last 3 completed shifts: In: 300 [P.O.:300] Out: -   Intake/Output this shift:  No intake/output data recorded. Weight change: -6 kg Gen: NAD lying in bed CVS: Normal rate, no rub Resp: Bilateral chest rise with no increased work of breathing Abd: +BS, soft, NT/ND Ext: no edema, warm and well-perfused  Recent Labs  Lab 07/05/23 0512 07/07/23 0759 07/08/23 0519 07/09/23 0505  NA 135 135 132* 132*  K 3.8 4.6 4.4 4.2  CL 96* 97* 93* 95*  CO2 22 19* 19* 23  GLUCOSE 98 110* 107* 84  BUN 58* 58* 69* 36*  CREATININE 5.03* 5.56* 6.54* 4.66*  ALBUMIN  2.0* 2.1* 1.9* 2.1*  CALCIUM  9.1 9.4 8.8* 9.4  PHOS 4.8* 7.1* 7.5* 6.7*   Liver Function Tests: Recent Labs  Lab 07/07/23 0759 07/08/23 0519 07/09/23 0505  ALBUMIN  2.1* 1.9* 2.1*   No results for input(s): "LIPASE", "AMYLASE" in the last 168 hours. No results for input(s): "AMMONIA" in the last 168 hours. CBC: Recent Labs  Lab 07/06/23 0554 07/07/23 0759 07/08/23 0519 07/09/23 0505 07/10/23 0545  WBC 12.7* 11.2* 10.5 11.9* 11.9*  NEUTROABS  --  8.8* 8.5*  --  9.2*  HGB 8.3* 8.5* 8.0* 8.6* 8.2*  HCT 26.0* 28.0* 25.5* 27.8* 26.1*  MCV 91.9 94.0 92.1 92.1 92.2  PLT 246 276 296 348 357   Cardiac Enzymes: No results for input(s): "CKTOTAL", "CKMB", "CKMBINDEX", "TROPONINI" in the last 168 hours. CBG: Recent Labs  Lab 07/08/23 1312 07/08/23 1759 07/09/23 0634 07/09/23 1213 07/09/23 2039  GLUCAP 128* 144* 83 165* 161*     Iron  Studies: No results for input(s): "IRON ", "TIBC", "TRANSFERRIN", "FERRITIN" in the last 72 hours. Studies/Results: MR LUMBAR SPINE WO CONTRAST Result Date: 07/09/2023 CLINICAL DATA:  Low back pain, cauda equina syndrome suspected. EXAM: MRI LUMBAR SPINE WITHOUT CONTRAST TECHNIQUE: Multiplanar, multisequence MR imaging of the lumbar spine was performed. No intravenous contrast was administered. COMPARISON:  MRI of the lumbar spine without contrast 12/18/2022. FINDINGS: Segmentation: 5 non rib-bearing lumbar type vertebral bodies are present. The lowest fully formed vertebral body is L5. Alignment: Minimal anterolisthesis at L4-5 is stable. Mild leftward curvature is centered at L3, stable. Vertebrae: Heterogeneous marrow signal is again noted, less depressed than on the prior exam. Conus medullaris and cauda equina: Conus extends to the T12-L1 level. Conus and cauda equina appear normal. Paraspinal and other soft tissues: Bilateral renal cysts are stable. The visualized abdomen is otherwise unremarkable. Disc levels: T12-L1: Mild facet hypertrophy is present bilaterally. No focal disc protrusion or stenosis is present. L1-2: Lateral disc protrusions are more prominent left than right. Moderate facet hypertrophy is noted. There is some crowding of nerve roots posteriorly without focal stenosis. L2-3: A broad-based disc protrusion and moderate facet hypertrophy lead to a similar appearance of severe central canal stenosis. Moderate foraminal narrowing bilaterally is also similar the  prior exam. L3-4: Chronic loss of disc height is present. Moderate right facet hypertrophy is present. Mild right subarticular narrowing is present. Moderate right and mild left foraminal stenosis is present. No significant interval change is present. L4-5: Uncovering of a broad-based disc protrusion is present. Right laminectomy is again noted. Moderate subarticular narrowing is stable, right greater than left. Mild foraminal  narrowing is also stable, right greater than left. L5-S1: A central disc protrusion and annular tear is present. The annular tear is more evident than on the prior exam. Mild subarticular and foraminal narrowing bilaterally is stable. IMPRESSION: 1. Central disc protrusion and annular tear at L5-S1. The annular tear is more evident than on the prior exam. This may represent some progression without significant change in mild subarticular and foraminal narrowing bilaterally at this level. 2. Stable severe central canal stenosis and moderate foraminal narrowing bilaterally at L2-3. 3. Mild right subarticular narrowing at L3-4 with moderate right and mild left foraminal stenosis. 4. Stable moderate subarticular narrowing at L4-5, right greater than left. 5. Heterogeneous marrow signal is again noted, less depressed than on the prior exam. Electronically Signed   By: Audree Leas M.D.   On: 07/09/2023 19:47   MR CERVICAL SPINE WO CONTRAST Result Date: 07/09/2023 CLINICAL DATA:  Myelopathy, acute, cervical spine. EXAM: MRI CERVICAL SPINE WITHOUT CONTRAST TECHNIQUE: Multiplanar, multisequence MR imaging of the cervical spine was performed. No intravenous contrast was administered. COMPARISON:  None Available. FINDINGS: Alignment: Slight degenerative retrolisthesis is present at C4-5 and C5-6. Mild straightening of the normal cervical lordosis is present. Vertebrae: Marrow signal and vertebral body heights are normal. No focal osseous lesions are present. Cord: Normal signal and morphology. Posterior Fossa, vertebral arteries, paraspinal tissues: Craniocervical junction is normal. Flow is present in the vertebral arteries bilaterally. Visualized intracranial contents are normal. Disc levels: C2-3: Negative. C3-4: Uncovertebral spurring leads to moderate right foraminal stenosis. The central canal is patent. C4-5: A broad-based disc osteophyte complex is present. This partially effaces the ventral CSF. Moderate  foraminal narrowing is worse right than left. C5-6: A broad-based disc osteophyte complex effaces the ventral CSF. Uncovertebral and facet hypertrophy contribute to moderate foraminal narrowing bilaterally, right greater than left. C6-7: Mild uncovertebral spurring and slight disc bulging is present without significant stenosis. C7-T1: Moderate facet hypertrophy is worse on the left. No focal disc protrusion or stenosis is present. IMPRESSION: 1. Moderate right foraminal stenosis at C3-4. 2. Moderate foraminal narrowing bilaterally at C4-5 and C5-6 is worse on the right. 3. Mild central canal stenosis at C4-5. 4. Moderate central canal stenosis at C5-6. 5. Moderate facet hypertrophy at C7-T1 is worse on the left. Electronically Signed   By: Audree Leas M.D.   On: 07/09/2023 19:41     amoxicillin -clavulanate  1 tablet Oral Q24H   apixaban   5 mg Oral BID   B-complex with vitamin C  1 tablet Oral Daily   Chlorhexidine  Gluconate Cloth  6 each Topical Q12H   cholecalciferol   1,000 Units Oral Daily   colestipol   1 g Oral TID AC   [START ON 07/14/2023] darbepoetin (ARANESP ) injection - DIALYSIS  150 mcg Subcutaneous Q Sun-1800   diclofenac  Sodium  2 g Topical QID   FLUoxetine   20 mg Oral Daily   gabapentin   300 mg Oral QHS   l-methylfolate-B6-B12  1 tablet Oral Daily   lipase/protease/amylase  36,000 Units Oral TID AC   mirtazapine   7.5 mg Oral QHS   multivitamin  1 tablet Oral QHS  pantoprazole   40 mg Oral BID   rivastigmine   1.5 mg Oral BID   sevelamer  carbonate  1,600 mg Oral TID WC   torsemide   100 mg Oral Q M,W,F   And   [START ON 07/14/2023] torsemide   100 mg Oral Q Sun    BMET    Component Value Date/Time   NA 132 (L) 07/09/2023 0505   NA 144 07/19/2020 1458   NA 140 03/14/2017 0810   K 4.2 07/09/2023 0505   K 3.8 03/14/2017 0810   CL 95 (L) 07/09/2023 0505   CO2 23 07/09/2023 0505   CO2 27 03/14/2017 0810   GLUCOSE 84 07/09/2023 0505   GLUCOSE 177 (H) 03/14/2017 0810    BUN 36 (H) 07/09/2023 0505   BUN 85 (HH) 07/19/2020 1458   BUN 35.6 (H) 03/14/2017 0810   CREATININE 4.66 (H) 07/09/2023 0505   CREATININE 2.31 (H) 10/25/2022 1355   CREATININE 1.8 (H) 03/14/2017 0810   CALCIUM  9.4 07/09/2023 0505   CALCIUM  8.3 (L) 06/18/2023 0334   CALCIUM  9.6 03/14/2017 0810   GFRNONAA 9 (L) 07/09/2023 0505   GFRNONAA 22 (L) 10/25/2022 1355   GFRNONAA 45 (L) 12/17/2013 1158   GFRAA 21 (L) 12/21/2019 0845   GFRAA 52 (L) 12/17/2013 1158   CBC    Component Value Date/Time   WBC 11.9 (H) 07/10/2023 0545   RBC 2.83 (L) 07/10/2023 0545   HGB 8.2 (L) 07/10/2023 0545   HGB 9.4 (L) 04/18/2023 1434   HGB 9.3 (L) 03/14/2017 0810   HCT 26.1 (L) 07/10/2023 0545   HCT 26.4 (L) 07/21/2019 0809   HCT 29.2 (L) 03/14/2017 0810   PLT 357 07/10/2023 0545   PLT 235 04/18/2023 1434   PLT 292 03/14/2017 0810   PLT 351 10/11/2016 1139   MCV 92.2 07/10/2023 0545   MCV 81.3 03/14/2017 0810   MCH 29.0 07/10/2023 0545   MCHC 31.4 07/10/2023 0545   RDW 16.6 (H) 07/10/2023 0545   RDW 14.0 03/14/2017 0810   LYMPHSABS 1.7 07/10/2023 0545   LYMPHSABS 1.4 03/14/2017 0810   MONOABS 0.7 07/10/2023 0545   MONOABS 0.4 03/14/2017 0810   EOSABS 0.0 07/10/2023 0545   EOSABS 0.1 03/14/2017 0810   BASOSABS 0.0 07/10/2023 0545   BASOSABS 0.0 03/14/2017 0810    HPI:  Pt is a 78 y.o.  with  hypertension, renal vein thrombosis on Eliquis , stroke, type II DM, lupus, CKD 4-5 who was recently admitted for CHF exacerbation and progressive CKD presented back with worsening dyspnea on exertion and fluid overload.      Assessment/Plan:   # Progressive CKD 5 versus AKI on CKD,: With frequent hospitalization for fluid overload.  Responding to IV diuretics but not po and with worsening renal function d/w patient and family (Daughter on phone 4/2) at length recommend starting dialysis. S/p LUE AVG + TDC placement 4/4  -Continue dialysis on TTS schedule - CLIP complete-  has been accepted at [facility]  MTThF - AVG removed as below   # CKD-MBD/hyperphosphatemia: Increased sevelamer  on 4/21. CTM. PTH at goal. Repeat on 5/1   #Anemia of CKD: Concern about GI bleed, underwent endoscopy t recently with no sign of bleeding.  Back on Eliquis .  Has required transfusion.  Given IV iron  and ESA   # Hypertension/volume: [name] improved after diuresis.  Torsemide  on non-dialysis days   #A fib: on eliquis    # Vascular access - developed steal syndrome of left hand. - VVS following s/p excision of  AVG 07/01/23 due to concern for steal syndrome with improvement of symptoms and ok to resume anticoagulation.    #Disposition - CIR today

## 2023-07-10 NOTE — Progress Notes (Signed)
 Physical Therapy Session Note  Patient Details  Name: [name] MRN: [id number] Date of Birth: [birth year]  Today's Date: 07/10/2023 PT Individual Time: 0930-1040 PT Individual Time Calculation (min): 70 min   Short Term Goals: Week 1:  PT Short Term Goal 1 (Week 1): pt will transfer bed<>chair with LRAD and CGA PT Short Term Goal 2 (Week 1): pt will transfer sit<>stand with LRAD and CGA PT Short Term Goal 3 (Week 1): pt will ambulate 63ft with LRAD and CGA  Skilled Therapeutic Interventions/Progress Updates:   Received pt sitting in [facility] with family and MD at bedside. Pt agreeable to PT treatment and reporting excruciating pain in R knee - MD ordering knee sleeve and pt up to date on all pain medication. Voltaren  gel applied and ace wrapped R knee for additional support. Session with emphasis on functional mobility/transfers, generalized strengthening and endurance, and dynamic standing balance/coordination.  Pt transported to/from room in [facility] dependently for time management and energy conservation purposes. Pt performed seated BLE strengthening on Kinetron at 20 cm/sec for 1 minute x 4 trials with emphasis on glute/quad strength and knee flexion ROM. Required x 2 attempts to stand from [facility] with RW and heavy mod A but unable to come completely upright and transition RUE from [facility] armrest to RW; insisting on sitting back down. Pt then performed seated heel slides 2x10 on RLE with emphasis on knee flexion ROM. Pt performed the following exercises with emphasis on UE/LE strength and ROM: -knee extension 2x10 bilaterally with 1.5lb ankle weight on LLE and unweighted on RLE 2x10 (noted decreased knee extension ROM) -marches 2x10 bilaterally with 1.5lb ankle weight on LLE -hip adduction ball squeezes 2x15 -bicep curls with 4lb dowel 2x15 Returned to room and concluded session with pt sitting in WC, needs within reach, and seatbelt alarm on. Provided pt with ice pack for R knee.   Therapy  Documentation Precautions:  Precautions Precautions: Fall Precaution/Restrictions Comments: dementia Restrictions Weight Bearing Restrictions Per Provider Order: No  Therapy/Group: Individual Therapy [name] Nena Bank PT, DPT 07/10/2023, 6:55 AM

## 2023-07-10 NOTE — Progress Notes (Signed)
 PROGRESS NOTE   Subjective/Complaints: Updated daughter Discussed bladder training program Discussed MRI results with patient, daughters, and granddaughter  ROS: +upper and lower extremity weakness, +bowel incontinence   Objective:   MR LUMBAR SPINE WO CONTRAST Result Date: 07/09/2023 CLINICAL DATA:  Low back pain, cauda equina syndrome suspected. EXAM: MRI LUMBAR SPINE WITHOUT CONTRAST TECHNIQUE: Multiplanar, multisequence MR imaging of the lumbar spine was performed. No intravenous contrast was administered. COMPARISON:  MRI of the lumbar spine without contrast 12/18/2022. FINDINGS: Segmentation: 5 non rib-bearing lumbar type vertebral bodies are present. The lowest fully formed vertebral body is L5. Alignment: Minimal anterolisthesis at L4-5 is stable. Mild leftward curvature is centered at L3, stable. Vertebrae: Heterogeneous marrow signal is again noted, less depressed than on the prior exam. Conus medullaris and cauda equina: Conus extends to the T12-L1 level. Conus and cauda equina appear normal. Paraspinal and other soft tissues: Bilateral renal cysts are stable. The visualized abdomen is otherwise unremarkable. Disc levels: T12-L1: Mild facet hypertrophy is present bilaterally. No focal disc protrusion or stenosis is present. L1-2: Lateral disc protrusions are more prominent left than right. Moderate facet hypertrophy is noted. There is some crowding of nerve roots posteriorly without focal stenosis. L2-3: A broad-based disc protrusion and moderate facet hypertrophy lead to a similar appearance of severe central canal stenosis. Moderate foraminal narrowing bilaterally is also similar the prior exam. L3-4: Chronic loss of disc height is present. Moderate right facet hypertrophy is present. Mild right subarticular narrowing is present. Moderate right and mild left foraminal stenosis is present. No significant interval change is present.  L4-5: Uncovering of a broad-based disc protrusion is present. Right laminectomy is again noted. Moderate subarticular narrowing is stable, right greater than left. Mild foraminal narrowing is also stable, right greater than left. L5-S1: A central disc protrusion and annular tear is present. The annular tear is more evident than on the prior exam. Mild subarticular and foraminal narrowing bilaterally is stable. IMPRESSION: 1. Central disc protrusion and annular tear at L5-S1. The annular tear is more evident than on the prior exam. This may represent some progression without significant change in mild subarticular and foraminal narrowing bilaterally at this level. 2. Stable severe central canal stenosis and moderate foraminal narrowing bilaterally at L2-3. 3. Mild right subarticular narrowing at L3-4 with moderate right and mild left foraminal stenosis. 4. Stable moderate subarticular narrowing at L4-5, right greater than left. 5. Heterogeneous marrow signal is again noted, less depressed than on the prior exam. Electronically Signed   By: Audree Leas M.D.   On: 07/09/2023 19:47   MR CERVICAL SPINE WO CONTRAST Result Date: 07/09/2023 CLINICAL DATA:  Myelopathy, acute, cervical spine. EXAM: MRI CERVICAL SPINE WITHOUT CONTRAST TECHNIQUE: Multiplanar, multisequence MR imaging of the cervical spine was performed. No intravenous contrast was administered. COMPARISON:  None Available. FINDINGS: Alignment: Slight degenerative retrolisthesis is present at C4-5 and C5-6. Mild straightening of the normal cervical lordosis is present. Vertebrae: Marrow signal and vertebral body heights are normal. No focal osseous lesions are present. Cord: Normal signal and morphology. Posterior Fossa, vertebral arteries, paraspinal tissues: Craniocervical junction is normal. Flow is present in the vertebral arteries bilaterally. Visualized intracranial contents are  normal. Disc levels: C2-3: Negative. C3-4: Uncovertebral spurring  leads to moderate right foraminal stenosis. The central canal is patent. C4-5: A broad-based disc osteophyte complex is present. This partially effaces the ventral CSF. Moderate foraminal narrowing is worse right than left. C5-6: A broad-based disc osteophyte complex effaces the ventral CSF. Uncovertebral and facet hypertrophy contribute to moderate foraminal narrowing bilaterally, right greater than left. C6-7: Mild uncovertebral spurring and slight disc bulging is present without significant stenosis. C7-T1: Moderate facet hypertrophy is worse on the left. No focal disc protrusion or stenosis is present. IMPRESSION: 1. Moderate right foraminal stenosis at C3-4. 2. Moderate foraminal narrowing bilaterally at C4-5 and C5-6 is worse on the right. 3. Mild central canal stenosis at C4-5. 4. Moderate central canal stenosis at C5-6. 5. Moderate facet hypertrophy at C7-T1 is worse on the left. Electronically Signed   By: Audree Leas M.D.   On: 07/09/2023 19:41   Recent Labs    07/09/23 0505 07/10/23 0545  WBC 11.9* 11.9*  HGB 8.6* 8.2*  HCT 27.8* 26.1*  PLT 348 357   Recent Labs    07/08/23 0519 07/09/23 0505  NA 132* 132*  K 4.4 4.2  CL 93* 95*  CO2 19* 23  GLUCOSE 107* 84  BUN 69* 36*  CREATININE 6.54* 4.66*  CALCIUM  8.8* 9.4    Intake/Output Summary (Last 24 hours) at 07/10/2023 1035 Last data filed at 07/09/2023 1903 Gross per 24 hour  Intake 180 ml  Output --  Net 180 ml        Physical Exam: Vital Signs Blood pressure (!) 157/65, pulse 89, temperature 99.2 F (37.3 C), temperature source Oral, resp. rate 17, height 5\' 4"  (1.626 m), weight 69.3 kg, SpO2 100%. Gen: no distress, normal appearing HEENT: oral mucosa pink and moist, NCAT Cardio: Reg rate Chest: normal effort, normal rate of breathing Abd: soft, non-distended Ext: no edema Musculoskeletal:        General: Swelling and tenderness (left antecubital region) present.     Cervical back: Normal range of  motion.  Skin:    Comments: AVF site CDI. No erythema. Right heel boggy  Neurological:     Mental Status: She is alert.     Comments: Oriented to self only. Unable to recall age, DOB or place. Poor recall and decrease insight into deficits. She was able to follow simple motor commands. No CN deficits. Limited RUE d/t HD catheter. LUE limited d/t pain. Grip/wrist at least 3-4/5 bilaterally. BLE 3- to 3/5 HF, KE and 4/5 ADF/PF. Decreased LT from ankles down bilaterally. No abnl resting tone. DTR's tr to 1+, stable 4/23 Psychiatric:     Comments: Pt flat, cooperative     Assessment/Plan: 1. Functional deficits which require 3+ hours per day of interdisciplinary therapy in a comprehensive inpatient rehab setting. Physiatrist is providing close team supervision and 24 hour management of active medical problems listed below. Physiatrist and rehab team continue to assess barriers to discharge/monitor patient progress toward functional and medical goals  Care Tool:  Bathing    Body parts bathed by patient: Right arm, Left arm, Chest, Abdomen, Face   Body parts bathed by helper: Front perineal area, Buttocks, Right upper leg, Right lower leg, Left lower leg, Left upper leg     Bathing assist Assist Level: Maximal Assistance - Patient 24 - 49%     Upper Body Dressing/Undressing Upper body dressing   What is the patient wearing?: Pull over shirt, Bra    Upper body assist  Assist Level: Maximal Assistance - Patient 25 - 49%    Lower Body Dressing/Undressing Lower body dressing      What is the patient wearing?: Incontinence brief, Pants     Lower body assist Assist for lower body dressing: Maximal Assistance - Patient 25 - 49%     Toileting Toileting    Toileting assist Assist for toileting: Total Assistance - Patient < 25%     Transfers Chair/bed transfer  Transfers assist     Chair/bed transfer assist level: Moderate Assistance - Patient 50 - 74%      Locomotion Ambulation   Ambulation assist      Assist level: Moderate Assistance - Patient 50 - 74% Assistive device: Other (comment) (L handrail and R HHA) Max distance: 49ft   Walk 10 feet activity   Assist  Walk 10 feet activity did not occur: Safety/medical concerns (pain)        Walk 50 feet activity   Assist Walk 50 feet with 2 turns activity did not occur: Safety/medical concerns (pain)         Walk 150 feet activity   Assist Walk 150 feet activity did not occur: Safety/medical concerns (pain)         Walk 10 feet on uneven surface  activity   Assist Walk 10 feet on uneven surfaces activity did not occur: Safety/medical concerns (pain)         Wheelchair     Assist Is the patient using a wheelchair?: Yes Type of Wheelchair: Manual    Wheelchair assist level: Dependent - Patient 0% Max wheelchair distance: >159ft    Wheelchair 50 feet with 2 turns activity    Assist        Assist Level: Dependent - Patient 0%   Wheelchair 150 feet activity     Assist      Assist Level: Dependent - Patient 0%   Blood pressure (!) 157/65, pulse 89, temperature 99.2 F (37.3 C), temperature source Oral, resp. rate 17, height 5\' 4"  (1.626 m), weight 69.3 kg, SpO2 100%.  Medical Problem List and Plan: 1. Functional deficits secondary to debility after acute respiratory failure and multiple medical issues             -patient may not yet shower             -ELOS/Goals: 10-14 days, supervision goals with PT, OT, SLP  Left message for daughter to update her 2.  Antithrombotics: -DVT/anticoagulation:  Pharmaceutical: Eliquis              -antiplatelet therapy: N/A 3. Acute on chronic LBP/Pain Management: Continue Hydrocodone  prn severe pain--monitor for sedation/confusion.              --Was on IV dilaudid  till 04/16-->now on hydrocodone  10 TID on average.              --increased use of Zanaflex  in past 48 hours.              --continue  Gabapentin  300 mg/hs 4. Hx of depression/anxiety/Sleep: LCSW to follow for evaluation and support             --melatonin prn for insomnia.              -antipsychotic agents: N/A             -pt on chronic prozac  and remeron  5. Neuropsych/cognition: This patient is not capable of making decisions on her own behalf. 6. Skin/Wound Care: Routine pressure relief measures.              -  float both heels 7. Fluids/Electrolytes/Nutrition: Strict I/O. Labs with HD             --1200 cc FR/day.  8. Cellulitis/Possible abscess LUE: On Van since 04/19 and Zosyn  since 04/17             --WBC trending down to 10.5 today. BC X 2 from 04/19 pending--negative so far.              --has been afebrile X 24 hours.  Zosyn  changed to Augmentin  by Dr. Sunnie England. End date for augmentin  is 4/29              -pt reports that arm is feeling less tender  9. H/o GIB: Monitor H/H as had further drop. Hgb down to             On IV iron /ESA  10. HTN: Monitor BP TID--on Torsemide  on non dialysis days.   11. 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 H/o Gastritis/esophagitis: Most recent admission last month. Continue PPI BID.  --Has required one unit PRBC 04/16.              --monitor for signs of bleeding.  13. Chronic diarrhea:  Managed with Colestid  and Creon  tid ac.   14. H/o cognitive/memory deficits: On Exelon  bid.    15. ESRD: Now HD dependent--TTS at the end of the day to help with tolerance of therapy             --continue renveal for metabolic bone disease.    17. T2DM: Hgb A1c- 6.2 and diet controlled. Add CM restrictions.  -d/c ISS, d/c CBGs  18. Bilateral knee pain: voltaren  gel scheduled, right knee sleeve ordered  19. Bowel incontinence: lumbar MRI showed and discussed there is no evidence of cauda equina  20. New upper extremity weakness: cervical MRI ordered and discussed that there is moderate canal stenosis  LOS: 2 days A FACE TO FACE EVALUATION WAS PERFORMED  Lavell Portugal P  Martrell Eguia 07/10/2023, 10:35 AM

## 2023-07-10 NOTE — Care Management (Signed)
 Inpatient Rehabilitation Center Individual Statement of Services  Patient Name:  [name]  Date:  07/10/2023  Welcome to the Inpatient Rehabilitation Center.  Our goal is to provide you with an individualized program based on your diagnosis and situation, designed to meet your specific needs.  With this comprehensive rehabilitation program, you will be expected to participate in at least 3 hours of rehabilitation therapies Monday-Friday, with modified therapy programming on the weekends.  Your rehabilitation program will include the following services:  Physical Therapy (PT), Occupational Therapy (OT), Speech Therapy (ST), 24 hour per day rehabilitation nursing, Therapeutic Recreaction (TR), Psychology, Neuropsychology, Care Coordinator, Rehabilitation Medicine, Nutrition Services, Pharmacy Services, and Other  Weekly team conferences will be held on Wednesdays to discuss your progress.  Your Inpatient Rehabilitation Care Coordinator will talk with you frequently to get your input and to update you on team discussions.  Team conferences with you and your family in attendance may also be held.  Expected length of stay: 12-14 days     Overall anticipated outcome: Supervision  Depending on your progress and recovery, your program may change. Your Inpatient Rehabilitation Care Coordinator will coordinate services and will keep you informed of any changes. Your Inpatient Rehabilitation Care Coordinator's name and contact numbers are listed  below.  The following services may also be recommended but are not provided by the Inpatient Rehabilitation Center:  Driving Evaluations Home Health Rehabiltiation Services Outpatient Rehabilitation Services Vocational Rehabilitation   Arrangements will be made to provide these services after discharge if needed.  Arrangements include referral to agencies that provide these services.  Your insurance has been verified to be:  [employer]  Your  primary doctor is:  Merl Star  Pertinent information will be shared with your doctor and your insurance company.  Inpatient Rehabilitation Care Coordinator:  Kathey Pang [phone] or ([phone]  Information discussed with and copy given to patient by: [name], 07/10/2023, 3:47 PM

## 2023-07-10 NOTE — Progress Notes (Signed)
 Physical Therapy Session Note  Patient Details  Name: [name] MRN: [id number] Date of Birth: Oct 19, 1945  Today's Date: 07/10/2023 PT Individual Time: 1107-1158 PT Individual Time Calculation (min): 51 min   Short Term Goals: Week 1:  PT Short Term Goal 1 (Week 1): pt will transfer bed<>chair with LRAD and CGA PT Short Term Goal 2 (Week 1): pt will transfer sit<>stand with LRAD and CGA PT Short Term Goal 3 (Week 1): pt will ambulate 32ft with LRAD and CGA  Skilled Therapeutic Interventions/Progress Updates: Patient sitting in WC on entrance to room. Patient alert and agreeable to PT session.   Patient reported 8/10 pain on R knee (ACE wrap donned from previous PT session and pain medication provided). Pt transported outside of [facility] in WC to change scenery, boost pt morale, and environmental distractions from pain. Pt sitting in WC and performed AROM/PROM and breathing/distractive techniques provided during session to educate pt on avoiding holding breath, and the rationale behind. LAQ/hip flexion to R LE increased pt's pain with reports of decrease in pain (6-7/10) with implementation of breathing technique. PTA provided PROM at first while pt focused on breathing, then AROM of hip flexion and LAQ (less than 50% ROM). Pt required rest breaks throughout, and mod cuing to maintain breathing during activity. Pt further educated on to avoid holding breath as this increases tension in body, and that breathing will not cure pt's pain, but can be used as a tool to avoid becoming stiff in joints. Pt transported back to room in [facility] and performed anterior/posterior scooting in [facility] with multimodal cues for technique to advance one hip at a time. Pt with modA at first, then progressed to minA with increased time/effort to scoot (cues to maintain breathing during scoot required).   Patient sitting in WC at end of session with brakes locked, belt alarm set, and all needs within reach.      Therapy  Documentation Precautions:  Precautions Precautions: Fall Precaution/Restrictions Comments: dementia Restrictions Weight Bearing Restrictions Per Provider Order: No  Therapy/Group: Individual Therapy  Purity Irmen PTA 07/10/2023, 2:02 PM

## 2023-07-10 NOTE — Progress Notes (Signed)
 Orthopedic Tech Progress Note Patient Details:  [name] 1945/07/01 [id number]  Ortho Devices Type of Ortho Device: Knee Sleeve Ortho Device/Splint Location: RLE Ortho Device/Splint Interventions: Application, Adjustment, Ordered   Post Interventions Patient Tolerated: Well Instructions Provided: Care of device  Patient will have brace applied on after therapy tomorrow 07/11/2023  Udell Gamble 07/10/2023, 4:17 PM

## 2023-07-10 NOTE — Progress Notes (Signed)
 Inpatient Rehabilitation Care Coordinator Assessment and Plan Patient Details  Name: [name] MRN: [id number] Date of Birth: 24-Dec-1945  Today's Date: 07/10/2023  Hospital Problems: Principal Problem:   Debility  Past Medical History:  Past Medical History:  Diagnosis Date   Abdominal discomfort    Chronic N/V/D. Presumptive dx Crohn's dx per elevated p ANCA. Failed Entocort and Pentasa. Sep 2003 - ileocolectomy c anastomosis per Dr Duwaine Gins 2/2 adhesions - path was hegative for Crohns. EGD, Sm bowel follow through (11/03), and an eteroclysis (10/03) were unrevealing. Cuases hypomag and hypocalcemia.   Adnexal mass 10/2001   s/p lap BSO (R ovarian fibroma) & lysis of adhesions   Allergy    Seasonal   Anemia    Multifactorial. Baseline HgB 10-11 ish. B12 def - 150 in 3/10. Fe Def - ferritin 35 3/10. Both are being repleted.   Breast cancer (HCC) 03/16/2013   right, 5 o'clock   CAD (coronary artery disease) 1996   1996 - PTCA and angioplasty diagonal branch. 2000 - Rotoblator & angiopllasty of diagonal. 2006 - subendocardial AMI, DES to proximal LAD.Aaron Aas Also had 90% stenosis in distal apical LAD. EF 55 with apical hypokinesis. Indefinite ASA and Plavix .   CHF (congestive heart failure) (HCC)    Chronic kidney disease, stage 4 (severe) (HCC)    Chronic pain    CT 10/10 = Spinal stenosis L2 - S1.   COVID-19 11/02/2020   Diabetes mellitus    Insulin  dependent   Diabetes mellitus type 2 in obese 05/03/2010   Managed on lantus  and novolog . Has diabetic nephropathy. Metformin D/C'd 2012 2/2 creatinine.  No diabetic retinopathy per 3/11.    Gout    Hx of radiation therapy 06/02/13- 07/16/13   right rbeast 4500 cGy 25 sessions, right breast boost 1600 cGy in 8 sessions   Hyperlipidemia    Managed with both a statin and Welchol . Welchol  stopped 2014 2/2 cost and started on fenofibrate     Hypertension    2006 B renal arteries patent. 2003 MRA - no RAS. 2003 pheo W/U Dr Francetta Innocent reportedly  negative.   Hypoxia 07/23/2017   Lupus    Lymphedema of breast    Personal history of radiation therapy 2015   Pneumonia of left lung due to infectious organism    RBBB    Renal vein thrombosis (HCC)    SBO (small bowel obstruction) (HCC) 09/17/2017   Secondary hyperparathyroidism (HCC)    Stroke [facility])    Incidental finding MRI 2002 L lacunar infarct   Vitamin B12 deficiency    Vitamin D  deficiency    Wears dentures    top   Past Surgical History:  Past Surgical History:  Procedure Laterality Date   ABDOMINAL HYSTERECTOMY     ARTERY REPAIR Left 07/01/2023   Procedure: REPAIR, ARTERY, BRACHIAL USING VEIN PATCH;  Surgeon: Young Hensen, MD;  Location: [facility] OR;  Service: Vascular;  Laterality: Left;   AVGG REMOVAL Left 07/01/2023   Procedure: REMOVAL OF ARTERIOVENOUS GORETEX GRAFT (AVGG);  Surgeon: Young Hensen, MD;  Location: [facility] OR;  Service: Vascular;  Laterality: Left;  LEFT ARM   BILATERAL SALPINGOOPHORECTOMY  8/03   Lap BSO (R ovarian fibroma) and adhesion lysis   BIOPSY  09/08/2020   Procedure: BIOPSY;  Surgeon: Lanita Pitman, MD;  Location: [facility] ENDOSCOPY;  Service: Endoscopy;;   BOWEL RESECTION  2003   ileocolectomy with anastomosis 2/2 adhesions   BREAST BIOPSY Right 2015   BREAST BIOPSY Right 07/2014   BREAST  LUMPECTOMY Right 04/22/2013   BREAST LUMPECTOMY WITH NEEDLE LOCALIZATION AND AXILLARY SENTINEL LYMPH NODE BX Right 04/22/2013   Procedure: BREAST LUMPECTOMY WITH NEEDLE LOCALIZATION AND AXILLARY SENTINEL LYMPH NODE BX;  Surgeon: Lockie Rima, MD;  Location: [facility];  Service: General;  Laterality: Right;   CARDIAC CATHETERIZATION     2 stents   CHOLECYSTECTOMY     COLONOSCOPY     COLONOSCOPY N/A 05/30/2023   Procedure: COLONOSCOPY;  Surgeon: Felecia Hopper, MD;  Location: [facility] ENDOSCOPY;  Service: Gastroenterology;  Laterality: N/A;   ESOPHAGOGASTRODUODENOSCOPY N/A 09/08/2020   Procedure: ESOPHAGOGASTRODUODENOSCOPY (EGD);  Surgeon:  Lanita Pitman, MD;  Location: [facility];  Service: Endoscopy;  Laterality: N/A;   ESOPHAGOGASTRODUODENOSCOPY N/A 05/30/2023   Procedure: EGD (ESOPHAGOGASTRODUODENOSCOPY);  Surgeon: Felecia Hopper, MD;  Location: [facility] END[facility]COPY;  Service: Gastroenterology;  Laterality: N/A;   HEMICOLECTOMY     R sided hemicolectomy   HERNIA REPAIR     Ventral hernia repair   INSERTION OF ARTERIOVENOUS (AV) ARTEGRAFT ARM Left 06/21/2023   Procedure: INSERTION, GRAFT, ARTERIOVENOUS, UPPER EXTREMITY;  Surgeon: Adine Hoof, MD;  Location: [facility] OR;  Service: Vascular;  Laterality: Left;   INSERTION OF DIALYSIS CATHETER Right 06/21/2023   Procedure: INSERTION OF DIALYSIS CATHETER;  Surgeon: Adine Hoof, MD;  Location: [facility] OR;  Service: Vascular;  Laterality: Right;   PTCA  4/06   Social History:  reports that she has never smoked. She has never used smokeless tobacco. She reports that she does not drink alcohol and does not use drugs.  Family / Support Systems Marital Status: Widow/Widower Patient Roles: Parent Spouse/Significant Other: Widowed Children: 5 children Other Supports: PRN support from various family Anticipated Caregiver: DIL- Danita Ability/Limitations of Caregiver: Pt will d/c to home with DIL Danita. 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 Education: high school grad Primary school teacher - How often do you need to have someone help you when you read instructions, pamphlets, or other written material from your doctor or pharmacy?: Often Writes: Yes Employment Status: Retired Marine scientist Issues: Denies   Abuse/Neglect Abuse/Neglect Assessment Can Be Completed: Yes Physical Abuse: Denies Verbal Abuse: Denies Sexual Abuse: Denies Exploitation of patient/patient's resources:  Denies Self-Neglect: Denies  Patient response to: Social Isolation - How often do you feel lonely or isolated from those around you?: Never  Emotional Status Pt's affect, behavior and adjustment status: Pt in good spirits at time of visit. Accuracy of answers various due to dementia Recent Psychosocial Issues: Denoes Psychiatric History: Denies Substance Abuse History: denies  Patient / Family Perceptions, Expectations & Goals Pt/Family understanding of illness & functional limitations: Pt family has a general understanding of care needs Premorbid pt/family roles/activities: Assistance with ADls/IADLs Anticipated changes in roles/activities/participation: continued assistance with ADLs/IADLs Pt/family expectations/goals: Pt woudl ike to "get out of here." Pt family would like to work on gettin her back to being as independent as possible  Building surveyor: None Premorbid Home Care/DME Agencies: None Transportation available at discharge: family Is the patient able to respond to transportation needs?: Yes In the past 12 months, has lack of transportation kept you from medical appointments or from getting medications?: No In the past 12 months, has lack of transportation kept you from meetings, work, or from getting things needed for daily living?: No Resource referrals recommended: Neuropsychology  Discharge Planning Living Arrangements: Other relatives Support Systems:  Children, Other relatives Type of Residence: Private residence Insurance Resources: Media planner (specify) ([employer] Medicare) Financial Resources: Restaurant manager, fast food Screen Referred: No Living Expenses: Mortgage Money Management: Family Does the patient have any problems obtaining your medications?: No Home Management: Assistance from family Patient/Family Preliminary Plans: no changes Care Coordinator Barriers to Discharge: Decreased caregiver support, Lack of/limited family  support, Insurance for SNF coverage Care Coordinator Anticipated Follow Up Needs: HH/OP Expected length of stay: 5/6  Clinical Impression SW completed assessment based on collateral reports, and some information from pt. SW will continue to follow-up with family for updates.   Mana Haberl A Yasser Hepp 07/10/2023, 4:21 PM

## 2023-07-10 NOTE — Progress Notes (Signed)
 Occupational Therapy Session Note  Patient Details  Name: [name] MRN: [id number] Date of Birth: 1946-01-14  Today's Date: 07/10/2023 OT Individual Time: 4098-1191 OT Individual Time Calculation (min): 75 min    Short Term Goals: Week 1:  OT Short Term Goal 1 (Week 1): Pt will complete sit > stand in prep for ADL with CGA using LRAD OT Short Term Goal 2 (Week 1): Pt will complete toilet transfer with CGA using LRAD OT Short Term Goal 3 (Week 1): Pt will initiate toileting need with min questioning cues OT Short Term Goal 4 (Week 1): Pt will complete 1/3 toileting steps with min A for balance  Skilled Therapeutic Interventions/Progress Updates:  Pt greeted semi reclined in bed, pt agreeable to OT intervention.      Transfers/bed mobility/functional mobility:  Pt completed supine>sit with Increased time and step by step cues with only MINA, assist was needed to manage RLE to EOB. Pt completed sit>stand to RW with initial MODA however sit>stands progressed to MIN A as session continued. Stand step transfer to w/c with RW and MINA. Pt noted to stand with flexed knees, unsure if this is her baseline.   Therapeutic activity:  Attempted sit>stand task at hi lo table to engage in connect 4 task. Pt reports + pain in R knee unable to complete task in standing. Deferred  task to siting d/t pain with pt able sit to manipulate pieces with ease. Graded task up with pt then instructed to complete seated resistance task with pt using hand exerciser to manipulate pegs from board with exercises on level 1.  Pt completed task with + time but MODI.   ADLs:  Grooming: pt completed seated oral care at sink with set- up assist d/t stiffness in bilateral hands with pt having difficulty with [facility].  UB dressing:pt donned shirt with MIN A, assist needed to pull head of shirt OH d/t stiffness in bilateral UEs LB dressing: pt donned pants with MAXA needing assist to thread pants and pull to waist  line Footwear: donned shoes and socks with MAX A  Bathing: pt completed wash up at sink with MOD A need assist to wash back and assist for standing/balancing while pt washed LB Transfers: pt completed stand pivot transfers with RW and MINA- MODA throughout session.     Ended session with pt seated in w/c with all needs within reach and safety belt alarm activated.                    Therapy Documentation Precautions:  Precautions Precautions: Fall Precaution/Restrictions Comments: dementia Restrictions Weight Bearing Restrictions Per Provider Order: No  Pain: Unrated pain reported in R knee, rest breaks provided as needed.     Therapy/Group: Individual Therapy  Mollie Anger [facility] 07/10/2023, 12:02 PM

## 2023-07-10 NOTE — Telephone Encounter (Signed)
 Pharmacy Patient Advocate Encounter   Received notification from Fax that prior authorization for Sevelamer  Carbonate 800MG  tablets is required/requested.   Insurance verification completed.   The patient is insured through [employer] .   Per test claim: PA required; PA submitted to above mentioned insurance via CoverMyMeds Key/confirmation #/[id number] Status is pending

## 2023-07-10 NOTE — Telephone Encounter (Signed)
 Pharmacy Patient Advocate Encounter  Received notification from [employer] that Prior Authorization for Sevelamer  Carbonate 800MG  tablets  has been DENIED.  Full denial letter will be uploaded to the media tab. See denial reason below. This request was denied under your Medicare Part D benefit; however, coverage/payment for the requested drug has been approved under Medicare Part B. You asked for the drug above because you are on dialysis. The Medicare rule in the Prescription Drug Benefit Manual (Chapter 6, Section 20.2) says that drugs covered under the Part B (medical) benefit cannot be covered under Part D. Drugs used for end stage renal disease (ESRD) and dialysis are included in the Part B bundled payment (which can include a Transitional Drug Add-on Payment Adjustment (TDAPA)) to the dialysis facility. [employer] has approved coverage for your drug under Part B for/through 03/18/2024.  PA #/Case ID/Reference #: [id number]

## 2023-07-10 NOTE — Plan of Care (Signed)
  Problem: RH BOWEL ELIMINATION Goal: RH STG MANAGE BOWEL WITH ASSISTANCE Description: STG Manage Bowel with mod I  Assistance. Outcome: Progressing   Problem: RH SAFETY Goal: RH STG ADHERE TO SAFETY PRECAUTIONS W/ASSISTANCE/DEVICE Description: STG Adhere to Safety Precautions With cues Assistance/Device. Outcome: Progressing   Problem: RH PAIN MANAGEMENT Goal: RH STG PAIN MANAGED AT OR BELOW PT'S PAIN GOAL Description: < 4 with prns Outcome: Progressing

## 2023-07-10 NOTE — Patient Care Conference (Signed)
 Inpatient RehabilitationTeam Conference and Plan of Care Update Date: 07/10/2023   Time: 11:34 AM    Patient Name: Clarabell [place] Ala      Medical Record Number: [id number]  Date of Birth: 13-Aug-1945 Sex: Female         Room/Bed: 4W21C/4W21C-01 Payor Info: Payor: [employer] MEDICARE / Plan: [employer] MEDICARE HMO / Product Type: *No Product type* /    Admit Date/Time:  07/08/2023  2:01 PM  Primary Diagnosis:  Debility  Hospital Problems: Principal Problem:   Debility    Expected Discharge Date: Expected Discharge Date: 07/23/23  Team Members Present: Physician leading conference: Dr. Laverle Postin Social Worker Present: Norval Been, LCSWA Nurse Present: Forrestine Ike, RN PT Present: Nena Bank, PT OT Present: Celestino Cole, OT SLP Present: Other (comment) Peggyann Bower, SLP)     Current Status/Progress Goal Weekly Team Focus  Bowel/Bladder   HD Patient (Oliguric) with B/B incontinence episode  LBM 07/08/23   Will regain B/B continence with normal pattern   Assist with time toileting qshift/prn    Swallow/Nutrition/ Hydration               ADL's   Max A UB and LB, Total A toileting   Supervision   Barriers- impaired grasp strength, high pain levels, cognitive deficits (awareness, problem solving), dynamic standing balance, GM and FM coorination    Mobility   bed mobility supervision, transfers with/without RW min/mod A, gait 28ft with RW min A   supervision  barriers: knee pain, dementia, global weakness/deconditioning, cognitive impairments, decreased standing balance/coordination.    Communication                Safety/Cognition/ Behavioral Observations               Pain   Verbalizes chronic pain to lower backon 10/10 scale.Pain well manged with PRN pain meds   Will verbalizes pain <3   Assess pain and adminster prn per orders    Skin   Skinis intact   skin will be intact with no breakdown  Assess skin and educate patient on how to prevent  breakdown      Discharge Planning:  D/c to home with DIL. PRN support from other family. SW will confirm there are no barriers to discharge.   Team Discussion: Patient admitted with debility post ESRD/new HD with acute onset of incontinence of bowel and bladder. Progress limited by pain in knees,, chronic diarrhea, cognitive impairments, poor motivation and limited standing balance.  Patient on target to meet rehab goals: Currently needs max assist for bathing and dressing with total assist for toileting. Needs mod - max assist for sit - stand transfers and only able to ambulate up to 4' using a RW.  Goals for discharge set for supervision overall.  *See Care Plan and progress notes for long and short-term goals.   Revisions to Treatment Plan:  Xray knees; steroid injection pending results due to abscess in left UE Voltaren  gel added for knees MD modified  medications   Teaching Needs: Safety, medications, dietary modification/CKD/Fluid restrictions, transfers, toileting, etc   Current Barriers to Discharge: Decreased caregiver support, Home enviroment access/layout, Hemodialysis, and Behavior  Possible Resolutions to Barriers: Family education     Medical Summary Current Status: overweight, moderate cervical canal stenosis, severe lumbar canal stenosis, chronic diarrhea, anemia, ESRD  Barriers to Discharge: Medical stability  Barriers to Discharge Comments: overweight, moderate cervical canal stenosis, severe lumbar canal stenosis, chronic diarrhea, anemia, ESRD Possible Resolutions to [employer] Focus:  provide dietary education, MRI lumbar anc cervical spine obtained and results discussed with family, adjusted dosing of pancreatic enzymes, continue to monitor Hgb, continue HD   Continued Need for Acute Rehabilitation Level of Care: The patient requires daily medical management by a physician with specialized training in physical medicine and rehabilitation for the following  reasons: Direction of a multidisciplinary physical rehabilitation program to maximize functional independence : Yes Medical management of patient stability for increased activity during participation in an intensive rehabilitation regime.: Yes Analysis of laboratory values and/or radiology reports with any subsequent need for medication adjustment and/or medical intervention. : Yes   I attest that I was present, lead the team conference, and concur with the assessment and plan of the team.   Forrestine Ike B 07/10/2023, 3:05 PM

## 2023-07-10 NOTE — Progress Notes (Signed)
 Patient ID: [name], female   DOB: [birth year], 78 y.o.   MRN: [id number]  1506- SW spoke with pt dtr Josephus Nida to provide updates from team conference, and inform on pain limiting factor today, and d/c date 5/6. Confirms she spoke with attending about this as well. SW will provide updates as available.   1511- SW spoke wit Greg/Dialysis Coord to inform on d/c date.    Norval Been, MSW, LCSW Office: [phone] Cell: [phone] Fax: [phone]

## 2023-07-11 LAB — CULTURE, BLOOD (ROUTINE X 2)
Culture: NO GROWTH
Culture: NO GROWTH
Special Requests: ADEQUATE

## 2023-07-11 MED ORDER — B COMPLEX-C PO TABS: 1.0000 | ORAL_TABLET | Freq: Every day | ORAL | Status: AC

## 2023-07-11 MED ORDER — HEPARIN SODIUM (PORCINE) 1000 UNIT/ML IJ SOLN
INTRAMUSCULAR | Status: AC
  Filled 2023-07-11: qty 4

## 2023-07-11 MED ORDER — BUPIVACAINE HCL (PF) 0.5 % IJ SOLN
10.0000 mL | Freq: Once | INTRAMUSCULAR | Status: DC
  Filled 2023-07-11: qty 10

## 2023-07-11 MED ORDER — ACETAMINOPHEN 325 MG PO TABS: 650.0000 mg | ORAL_TABLET | Freq: Four times a day (QID) | ORAL | Status: AC | PRN

## 2023-07-11 MED ORDER — HEPARIN SODIUM (PORCINE) 1000 UNIT/ML DIALYSIS
1000.0000 [IU] | INTRAMUSCULAR | Status: DC | PRN
  Administered 2023-07-11: 1000 [IU] via INTRAVENOUS_CENTRAL

## 2023-07-11 MED ORDER — VANCOMYCIN HCL 750 MG/150ML IV SOLN
INTRAVENOUS | Status: AC
  Filled 2023-07-11: qty 150

## 2023-07-11 MED ORDER — METHYLPREDNISOLONE ACETATE 40 MG/ML IJ SUSP
40.0000 mg | Freq: Once | INTRAMUSCULAR | Status: DC
  Filled 2023-07-11 (×2): qty 1

## 2023-07-11 NOTE — Plan of Care (Signed)
  Problem: RH BOWEL ELIMINATION Goal: RH STG MANAGE BOWEL WITH ASSISTANCE Description: STG Manage Bowel with mod I Assistance. Outcome: Progressing   Problem: RH SAFETY Goal: RH STG ADHERE TO SAFETY PRECAUTIONS W/ASSISTANCE/DEVICE Description: STG Adhere to Safety Precautions With cues Assistance/Device. Outcome: Progressing   Problem: RH PAIN MANAGEMENT Goal: RH STG PAIN MANAGED AT OR BELOW PT'S PAIN GOAL Description: < 4 with prns Outcome: Progressing   Problem: Fluid Volume: Goal: Compliance with measures to maintain balanced fluid volume will improve Description: Patient and family will be able to manage care at discharge using educational resources for 1200 cc fluid restriction/ dietary modification recommendations independently Outcome: Progressing

## 2023-07-11 NOTE — Progress Notes (Signed)
 Orthopedic Tech Progress Note Patient Details:  [name] 1945-12-05 [id number] Applied thumb spica velcro brace per order.  Ortho Devices Type of Ortho Device: Thumb velcro splint Ortho Device/Splint Location: RUE Ortho Device/Splint Interventions: Ordered, Application, Adjustment   Post Interventions Patient Tolerated: Well Instructions Provided: Adjustment of device, Care of device  [name] 07/11/2023, 9:31 PM

## 2023-07-11 NOTE — Progress Notes (Signed)
 Update provided to TCU RN regarding pt's d/c date of 5/6. Will assist as needed.   Lauraine Polite Renal Navigator [phone]

## 2023-07-11 NOTE — Progress Notes (Signed)
 PROGRESS NOTE   Subjective/Complaints: C/o right sided knee pain, discussed XR findings and steroid injection and she would like to have this, messaged ortho regarding possible injection/aspiration  ROS: +upper and lower extremity weakness, +bowel incontinence, +right knee pain   Objective:   DG Knee Complete 4 Views Right Result Date: 07/10/2023 CLINICAL DATA:  Chronic right knee pain. Worsened over the past few days. EXAM: RIGHT KNEE - COMPLETE 4+ VIEW COMPARISON:  None Available. FINDINGS: Moderate medial and mild to moderate lateral compartment chondrocalcinosis. Mild tricompartmental joint space narrowing. Mild superior greater than inferior patellar degenerative spurring. Moderate knee joint effusion. No acute fracture or dislocation. Moderate atherosclerotic calcifications. IMPRESSION: 1. Mild tricompartmental osteoarthritis. 2. Moderate knee joint effusion. 3. Moderate medial and mild to moderate lateral compartment chondrocalcinosis. This is nonspecific and may be secondary to osteoarthritis, calcium  pyrophosphate dihydrate deposition disease (CPPD), among other etiologies. Electronically Signed   By: Bertina Broccoli M.D.   On: 07/10/2023 17:49   MR LUMBAR SPINE WO CONTRAST Result Date: 07/09/2023 CLINICAL DATA:  Low back pain, cauda equina syndrome suspected. EXAM: MRI LUMBAR SPINE WITHOUT CONTRAST TECHNIQUE: Multiplanar, multisequence MR imaging of the lumbar spine was performed. No intravenous contrast was administered. COMPARISON:  MRI of the lumbar spine without contrast 12/18/2022. FINDINGS: Segmentation: 5 non rib-bearing lumbar type vertebral bodies are present. The lowest fully formed vertebral body is L5. Alignment: Minimal anterolisthesis at L4-5 is stable. Mild leftward curvature is centered at L3, stable. Vertebrae: Heterogeneous marrow signal is again noted, less depressed than on the prior exam. Conus medullaris and cauda  equina: Conus extends to the T12-L1 level. Conus and cauda equina appear normal. Paraspinal and other soft tissues: Bilateral renal cysts are stable. The visualized abdomen is otherwise unremarkable. Disc levels: T12-L1: Mild facet hypertrophy is present bilaterally. No focal disc protrusion or stenosis is present. L1-2: Lateral disc protrusions are more prominent left than right. Moderate facet hypertrophy is noted. There is some crowding of nerve roots posteriorly without focal stenosis. L2-3: A broad-based disc protrusion and moderate facet hypertrophy lead to a similar appearance of severe central canal stenosis. Moderate foraminal narrowing bilaterally is also similar the prior exam. L3-4: Chronic loss of disc height is present. Moderate right facet hypertrophy is present. Mild right subarticular narrowing is present. Moderate right and mild left foraminal stenosis is present. No significant interval change is present. L4-5: Uncovering of a broad-based disc protrusion is present. Right laminectomy is again noted. Moderate subarticular narrowing is stable, right greater than left. Mild foraminal narrowing is also stable, right greater than left. L5-S1: A central disc protrusion and annular tear is present. The annular tear is more evident than on the prior exam. Mild subarticular and foraminal narrowing bilaterally is stable. IMPRESSION: 1. Central disc protrusion and annular tear at L5-S1. The annular tear is more evident than on the prior exam. This may represent some progression without significant change in mild subarticular and foraminal narrowing bilaterally at this level. 2. Stable severe central canal stenosis and moderate foraminal narrowing bilaterally at L2-3. 3. Mild right subarticular narrowing at L3-4 with moderate right and mild left foraminal stenosis. 4. Stable moderate subarticular narrowing at L4-5, right greater than  left. 5. Heterogeneous marrow signal is again noted, less depressed than on  the prior exam. Electronically Signed   By: Audree Leas M.D.   On: 07/09/2023 19:47   MR CERVICAL SPINE WO CONTRAST Result Date: 07/09/2023 CLINICAL DATA:  Myelopathy, acute, cervical spine. EXAM: MRI CERVICAL SPINE WITHOUT CONTRAST TECHNIQUE: Multiplanar, multisequence MR imaging of the cervical spine was performed. No intravenous contrast was administered. COMPARISON:  None Available. FINDINGS: Alignment: Slight degenerative retrolisthesis is present at C4-5 and C5-6. Mild straightening of the normal cervical lordosis is present. Vertebrae: Marrow signal and vertebral body heights are normal. No focal osseous lesions are present. Cord: Normal signal and morphology. Posterior Fossa, vertebral arteries, paraspinal tissues: Craniocervical junction is normal. Flow is present in the vertebral arteries bilaterally. Visualized intracranial contents are normal. Disc levels: C2-3: Negative. C3-4: Uncovertebral spurring leads to moderate right foraminal stenosis. The central canal is patent. C4-5: A broad-based disc osteophyte complex is present. This partially effaces the ventral CSF. Moderate foraminal narrowing is worse right than left. C5-6: A broad-based disc osteophyte complex effaces the ventral CSF. Uncovertebral and facet hypertrophy contribute to moderate foraminal narrowing bilaterally, right greater than left. C6-7: Mild uncovertebral spurring and slight disc bulging is present without significant stenosis. C7-T1: Moderate facet hypertrophy is worse on the left. No focal disc protrusion or stenosis is present. IMPRESSION: 1. Moderate right foraminal stenosis at C3-4. 2. Moderate foraminal narrowing bilaterally at C4-5 and C5-6 is worse on the right. 3. Mild central canal stenosis at C4-5. 4. Moderate central canal stenosis at C5-6. 5. Moderate facet hypertrophy at C7-T1 is worse on the left. Electronically Signed   By: Audree Leas M.D.   On: 07/09/2023 19:41   Recent Labs     07/09/23 0505 07/10/23 0545  WBC 11.9* 11.9*  HGB 8.6* 8.2*  HCT 27.8* 26.1*  PLT 348 357   Recent Labs    07/09/23 0505  NA 132*  K 4.2  CL 95*  CO2 23  GLUCOSE 84  BUN 36*  CREATININE 4.66*  CALCIUM  9.4    Intake/Output Summary (Last 24 hours) at 07/11/2023 1054 Last data filed at 07/10/2023 1841 Gross per 24 hour  Intake 360 ml  Output --  Net 360 ml        Physical Exam: Vital Signs Blood pressure (!) 159/77, pulse 80, temperature 98 F (36.7 C), resp. rate 16, height 5\' 4"  (1.626 m), weight 69.9 kg, SpO2 95%. Gen: no distress, normal appearing HEENT: oral mucosa pink and moist, NCAT Cardio: Reg rate Chest: normal effort, normal rate of breathing Abd: soft, non-distended Ext: no edema Musculoskeletal:        General: Swelling and tenderness (left antecubital region) present.     Cervical back: Normal range of motion.  Skin:    Comments: AVF site CDI. No erythema. Right heel boggy  Neurological:     Mental Status: She is alert.     Comments: Oriented to self only. Unable to recall age, DOB or place. Poor recall and decrease insight into deficits. She was able to follow simple motor commands. No CN deficits. Limited RUE d/t HD catheter. LUE limited d/t pain. Grip/wrist at least 3-4/5 bilaterally. BLE 3- to 3/5 HF, KE and 4/5 ADF/PF. Decreased LT from ankles down bilaterally. No abnl resting tone. DTR's tr to 1+, stable 4/24 Psychiatric:     Comments: Pt flat, cooperative     Assessment/Plan: 1. Functional deficits which require 3+ hours per day of interdisciplinary therapy in a comprehensive  inpatient rehab setting. Physiatrist is providing close team supervision and 24 hour management of active medical problems listed below. Physiatrist and rehab team continue to assess barriers to discharge/monitor patient progress toward functional and medical goals  Care Tool:  Bathing    Body parts bathed by patient: Right arm, Left arm, Chest, Abdomen, Face   Body  parts bathed by helper: Front perineal area, Buttocks, Right upper leg, Right lower leg, Left lower leg, Left upper leg     Bathing assist Assist Level: Maximal Assistance - Patient 24 - 49%     Upper Body Dressing/Undressing Upper body dressing   What is the patient wearing?: Pull over shirt, Bra    Upper body assist Assist Level: Maximal Assistance - Patient 25 - 49%    Lower Body Dressing/Undressing Lower body dressing      What is the patient wearing?: Incontinence brief, Pants     Lower body assist Assist for lower body dressing: Maximal Assistance - Patient 25 - 49%     Toileting Toileting    Toileting assist Assist for toileting: Total Assistance - Patient < 25%     Transfers Chair/bed transfer  Transfers assist     Chair/bed transfer assist level: Moderate Assistance - Patient 50 - 74%     Locomotion Ambulation   Ambulation assist      Assist level: Moderate Assistance - Patient 50 - 74% Assistive device: Other (comment) (L handrail and R HHA) Max distance: 4ft   Walk 10 feet activity   Assist  Walk 10 feet activity did not occur: Safety/medical concerns (pain)        Walk 50 feet activity   Assist Walk 50 feet with 2 turns activity did not occur: Safety/medical concerns (pain)         Walk 150 feet activity   Assist Walk 150 feet activity did not occur: Safety/medical concerns (pain)         Walk 10 feet on uneven surface  activity   Assist Walk 10 feet on uneven surfaces activity did not occur: Safety/medical concerns (pain)         Wheelchair     Assist Is the patient using a wheelchair?: Yes Type of Wheelchair: Manual    Wheelchair assist level: Dependent - Patient 0% Max wheelchair distance: >171ft    Wheelchair 50 feet with 2 turns activity    Assist        Assist Level: Dependent - Patient 0%   Wheelchair 150 feet activity     Assist      Assist Level: Dependent - Patient 0%    Blood pressure (!) 159/77, pulse 80, temperature 98 F (36.7 C), resp. rate 16, height 5\' 4"  (1.626 m), weight 69.9 kg, SpO2 95%.  Medical Problem List and Plan: 1. Functional deficits secondary to debility after acute respiratory failure and multiple medical issues             -patient may not yet shower             -ELOS/Goals: 10-14 days, supervision goals with PT, OT, SLP  Left message for daughter to update her 2.  Antithrombotics: -DVT/anticoagulation:  Pharmaceutical: Eliquis              -antiplatelet therapy: N/A 3. Acute on chronic LBP/Pain Management: Continue Hydrocodone  prn severe pain--monitor for sedation/confusion.              --Was on IV dilaudid  till 04/16-->now on hydrocodone  10 TID on average.              --  increased use of Zanaflex  in past 48 hours.              --continue Gabapentin  300 mg/hs 4. Hx of depression/anxiety/Sleep: LCSW to follow for evaluation and support             --melatonin prn for insomnia.              -antipsychotic agents: N/A             -pt on chronic prozac  and remeron  5. Neuropsych/cognition: This patient is not capable of making decisions on her own behalf. 6. Skin/Wound Care: Routine pressure relief measures.              -float both heels 7. Fluids/Electrolytes/Nutrition: Strict I/O. Labs with HD             --1200 cc FR/day.  8. Cellulitis/Possible abscess LUE: On Van since 04/19 and Zosyn  since 04/17             --WBC trending down to 10.5 today. BC X 2 from 04/19 pending--negative so far.              --has been afebrile X 24 hours.  Zosyn  changed to Augmentin  by Dr. Sunnie England. End date for augmentin  is 4/29              -pt reports that arm is feeling less tender  9. H/o GIB: Monitor H/H as had further drop. Hgb down to             On IV iron /ESA  10. HTN: Monitor BP TID--on Torsemide  on non dialysis days.   11. 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 H/o Gastritis/esophagitis: Most recent admission last  month. Continue PPI BID.  --Has required one unit PRBC 04/16.              --monitor for signs of bleeding.  13. Chronic diarrhea:  Managed with Colestid  and Creon  tid- changed to after meals as per daughter's request  14. H/o cognitive/memory deficits: On Exelon  bid.    15. ESRD: Now HD dependent--TTS at the end of the day to help with tolerance of therapy             --continue renveal for metabolic bone disease.    17. T2DM: Hgb A1c- 6.2 and diet controlled. Add CM restrictions.  -d/c ISS, d/c CBGs  18. Bilateral knee pain: voltaren  gel scheduled, right knee sleeve ordered, consulted ortho for right knee steroid injection  19. Bowel incontinence: lumbar MRI showed and discussed there is no evidence of cauda equina  20. New upper extremity weakness: cervical MRI ordered and discussed that there is moderate canal stenosis, discussed with daughter that oral steroids, steroid injection, surgery are possible interventions- deferred at this time given patient's risk factors  LOS: 3 days A FACE TO FACE EVALUATION WAS PERFORMED  Keven Pel Melitza Metheny 07/11/2023, 10:54 AM

## 2023-07-11 NOTE — Plan of Care (Signed)
  Problem: Consults Goal: RH GENERAL PATIENT EDUCATION Description: See Patient Education module for education specifics. Outcome: Progressing   Problem: RH BOWEL ELIMINATION Goal: RH STG MANAGE BOWEL WITH ASSISTANCE Description: STG Manage Bowel with mod I Assistance. Outcome: Progressing Goal: RH STG MANAGE BOWEL W/MEDICATION W/ASSISTANCE Description: STG Manage Bowel with Medication with mod I Assistance. Outcome: Progressing   Problem: RH SAFETY Goal: RH STG ADHERE TO SAFETY PRECAUTIONS W/ASSISTANCE/DEVICE Description: STG Adhere to Safety Precautions With cues Assistance/Device. Outcome: Progressing   Problem: RH PAIN MANAGEMENT Goal: RH STG PAIN MANAGED AT OR BELOW PT'S PAIN GOAL Description: < 4 with prns Outcome: Progressing   Problem: RH KNOWLEDGE DEFICIT GENERAL Goal: RH STG INCREASE KNOWLEDGE OF SELF CARE AFTER HOSPITALIZATION Description: Patient and family will be able to manage care at discharge using educational resources for medications, dietary modification recommendations independently Outcome: Progressing   Problem: Fluid Volume: Goal: Compliance with measures to maintain balanced fluid volume will improve Description: Patient and family will be able to manage care at discharge using educational resources for 1200 cc fluid restriction/ dietary modification recommendations independently Outcome: Progressing   Problem: Health Behavior/Discharge Planning: Goal: Ability to manage health-related needs will improve Outcome: Progressing   Problem: Nutritional: Goal: Ability to make healthy dietary choices will improve Description: Patient and family will be able to manage care at discharge using educational resources for ESRD/HD dietary modification recommendations independently Outcome: Progressing   Problem: Clinical Measurements: Goal: Complications related to the disease process, condition or treatment will be avoided or minimized Description: Patient and  family will be able to manage care at discharge using educational resources for medications, dietary modification recommendations independently Outcome: Progressing   Problem: Education: Goal: Knowledge of disease and its progression will improve Outcome: Progressing Goal: Individualized Educational Video(s) Outcome: Progressing

## 2023-07-11 NOTE — Progress Notes (Signed)
 Patient ID: [name], female   DOB: 08/21/1945, 78 y.o.   MRN: [id number] S: Patient continues to have knee pain but no other complaints  O:BP (!) 159/77 (BP Location: Right Arm)   Pulse 80   Temp 98 F (36.7 C)   Resp 16   Ht 5\' 4"  (1.626 m)   Wt 69.9 kg   SpO2 95%   BMI 26.45 kg/m   Intake/Output Summary (Last 24 hours) at 07/11/2023 1009 Last data filed at 07/10/2023 1841 Gross per 24 hour  Intake 360 ml  Output --  Net 360 ml   Intake/Output: I/O last 3 completed shifts: In: 480 [P.O.:480] Out: -   Intake/Output this shift:  No intake/output data recorded. Weight change: 0.6 kg Gen: NAD lying in bed CVS: Normal rate, no rub Resp: Bilateral chest rise with no increased work of breathing Abd: +BS, soft, NT/ND Ext: no edema, warm and well-perfused  Recent Labs  Lab 07/05/23 0512 07/07/23 0759 07/08/23 0519 07/09/23 0505  NA 135 135 132* 132*  K 3.8 4.6 4.4 4.2  CL 96* 97* 93* 95*  CO2 22 19* 19* 23  GLUCOSE 98 110* 107* 84  BUN 58* 58* 69* 36*  CREATININE 5.03* 5.56* 6.54* 4.66*  ALBUMIN  2.0* 2.1* 1.9* 2.1*  CALCIUM  9.1 9.4 8.8* 9.4  PHOS 4.8* 7.1* 7.5* 6.7*   Liver Function Tests: Recent Labs  Lab 07/07/23 0759 07/08/23 0519 07/09/23 0505  ALBUMIN  2.1* 1.9* 2.1*   No results for input(s): "LIPASE", "AMYLASE" in the last 168 hours. No results for input(s): "AMMONIA" in the last 168 hours. CBC: Recent Labs  Lab 07/06/23 0554 07/07/23 0759 07/08/23 0519 07/09/23 0505 07/10/23 0545  WBC 12.7* 11.2* 10.5 11.9* 11.9*  NEUTROABS  --  8.8* 8.5*  --  9.2*  HGB 8.3* 8.5* 8.0* 8.6* 8.2*  HCT 26.0* 28.0* 25.5* 27.8* 26.1*  MCV 91.9 94.0 92.1 92.1 92.2  PLT 246 276 296 348 357   Cardiac Enzymes: No results for input(s): "CKTOTAL", "CKMB", "CKMBINDEX", "TROPONINI" in the last 168 hours. CBG: Recent Labs  Lab 07/08/23 1759 07/09/23 0634 07/09/23 1213 07/09/23 2039 07/10/23 2048  GLUCAP 144* 83 165* 161* 157*    Iron  Studies: No results  for input(s): "IRON ", "TIBC", "TRANSFERRIN", "FERRITIN" in the last 72 hours. Studies/Results: DG Knee Complete 4 Views Right Result Date: 07/10/2023 CLINICAL DATA:  Chronic right knee pain. Worsened over the past few days. EXAM: RIGHT KNEE - COMPLETE 4+ VIEW COMPARISON:  None Available. FINDINGS: Moderate medial and mild to moderate lateral compartment chondrocalcinosis. Mild tricompartmental joint space narrowing. Mild superior greater than inferior patellar degenerative spurring. Moderate knee joint effusion. No acute fracture or dislocation. Moderate atherosclerotic calcifications. IMPRESSION: 1. Mild tricompartmental osteoarthritis. 2. Moderate knee joint effusion. 3. Moderate medial and mild to moderate lateral compartment chondrocalcinosis. This is nonspecific and may be secondary to osteoarthritis, calcium  pyrophosphate dihydrate deposition disease (CPPD), among other etiologies. Electronically Signed   By: Bertina Broccoli M.D.   On: 07/10/2023 17:49   MR LUMBAR SPINE WO CONTRAST Result Date: 07/09/2023 CLINICAL DATA:  Low back pain, cauda equina syndrome suspected. EXAM: MRI LUMBAR SPINE WITHOUT CONTRAST TECHNIQUE: Multiplanar, multisequence MR imaging of the lumbar spine was performed. No intravenous contrast was administered. COMPARISON:  MRI of the lumbar spine without contrast 12/18/2022. FINDINGS: Segmentation: 5 non rib-bearing lumbar type vertebral bodies are present. The lowest fully formed vertebral body is L5. Alignment: Minimal anterolisthesis at L4-5 is stable. Mild leftward curvature is centered at L3,  stable. Vertebrae: Heterogeneous marrow signal is again noted, less depressed than on the prior exam. Conus medullaris and cauda equina: Conus extends to the T12-L1 level. Conus and cauda equina appear normal. Paraspinal and other soft tissues: Bilateral renal cysts are stable. The visualized abdomen is otherwise unremarkable. Disc levels: T12-L1: Mild facet hypertrophy is present  bilaterally. No focal disc protrusion or stenosis is present. L1-2: Lateral disc protrusions are more prominent left than right. Moderate facet hypertrophy is noted. There is some crowding of nerve roots posteriorly without focal stenosis. L2-3: A broad-based disc protrusion and moderate facet hypertrophy lead to a similar appearance of severe central canal stenosis. Moderate foraminal narrowing bilaterally is also similar the prior exam. L3-4: Chronic loss of disc height is present. Moderate right facet hypertrophy is present. Mild right subarticular narrowing is present. Moderate right and mild left foraminal stenosis is present. No significant interval change is present. L4-5: Uncovering of a broad-based disc protrusion is present. Right laminectomy is again noted. Moderate subarticular narrowing is stable, right greater than left. Mild foraminal narrowing is also stable, right greater than left. L5-S1: A central disc protrusion and annular tear is present. The annular tear is more evident than on the prior exam. Mild subarticular and foraminal narrowing bilaterally is stable. IMPRESSION: 1. Central disc protrusion and annular tear at L5-S1. The annular tear is more evident than on the prior exam. This may represent some progression without significant change in mild subarticular and foraminal narrowing bilaterally at this level. 2. Stable severe central canal stenosis and moderate foraminal narrowing bilaterally at L2-3. 3. Mild right subarticular narrowing at L3-4 with moderate right and mild left foraminal stenosis. 4. Stable moderate subarticular narrowing at L4-5, right greater than left. 5. Heterogeneous marrow signal is again noted, less depressed than on the prior exam. Electronically Signed   By: Audree Leas M.D.   On: 07/09/2023 19:47   MR CERVICAL SPINE WO CONTRAST Result Date: 07/09/2023 CLINICAL DATA:  Myelopathy, acute, cervical spine. EXAM: MRI CERVICAL SPINE WITHOUT CONTRAST TECHNIQUE:  Multiplanar, multisequence MR imaging of the cervical spine was performed. No intravenous contrast was administered. COMPARISON:  None Available. FINDINGS: Alignment: Slight degenerative retrolisthesis is present at C4-5 and C5-6. Mild straightening of the normal cervical lordosis is present. Vertebrae: Marrow signal and vertebral body heights are normal. No focal osseous lesions are present. Cord: Normal signal and morphology. Posterior Fossa, vertebral arteries, paraspinal tissues: Craniocervical junction is normal. Flow is present in the vertebral arteries bilaterally. Visualized intracranial contents are normal. Disc levels: C2-3: Negative. C3-4: Uncovertebral spurring leads to moderate right foraminal stenosis. The central canal is patent. C4-5: A broad-based disc osteophyte complex is present. This partially effaces the ventral CSF. Moderate foraminal narrowing is worse right than left. C5-6: A broad-based disc osteophyte complex effaces the ventral CSF. Uncovertebral and facet hypertrophy contribute to moderate foraminal narrowing bilaterally, right greater than left. C6-7: Mild uncovertebral spurring and slight disc bulging is present without significant stenosis. C7-T1: Moderate facet hypertrophy is worse on the left. No focal disc protrusion or stenosis is present. IMPRESSION: 1. Moderate right foraminal stenosis at C3-4. 2. Moderate foraminal narrowing bilaterally at C4-5 and C5-6 is worse on the right. 3. Mild central canal stenosis at C4-5. 4. Moderate central canal stenosis at C5-6. 5. Moderate facet hypertrophy at C7-T1 is worse on the left. Electronically Signed   By: Audree Leas M.D.   On: 07/09/2023 19:41     amoxicillin -clavulanate  1 tablet Oral Q24H   apixaban   5 mg Oral BID   B-complex with vitamin C  1 tablet Oral Daily   Chlorhexidine  Gluconate Cloth  6 each Topical Q12H   Chlorhexidine  Gluconate Cloth  6 each Topical Q0600   cholecalciferol   1,000 Units Oral Daily    colestipol   1 g Oral TID AC   [START ON 07/14/2023] darbepoetin (ARANESP ) injection - DIALYSIS  150 mcg Subcutaneous Q Sun-1800   diclofenac  Sodium  2 g Topical QID   FLUoxetine   20 mg Oral Daily   gabapentin   300 mg Oral QHS   l-methylfolate-B6-B12  1 tablet Oral Daily   lipase/protease/amylase  36,000 Units Oral TID AC   mirtazapine   7.5 mg Oral QHS   multivitamin  1 tablet Oral QHS   pantoprazole   40 mg Oral BID   rivastigmine   1.5 mg Oral BID   sevelamer  carbonate  1,600 mg Oral TID WC   torsemide   100 mg Oral Q M,W,F   And   [START ON 07/14/2023] torsemide   100 mg Oral Q Sun    BMET    Component Value Date/Time   NA 132 (L) 07/09/2023 0505   NA 144 07/19/2020 1458   NA 140 03/14/2017 0810   K 4.2 07/09/2023 0505   K 3.8 03/14/2017 0810   CL 95 (L) 07/09/2023 0505   CO2 23 07/09/2023 0505   CO2 27 03/14/2017 0810   GLUCOSE 84 07/09/2023 0505   GLUCOSE 177 (H) 03/14/2017 0810   BUN 36 (H) 07/09/2023 0505   BUN 85 (HH) 07/19/2020 1458   BUN 35.6 (H) 03/14/2017 0810   CREATININE 4.66 (H) 07/09/2023 0505   CREATININE 2.31 (H) 10/25/2022 1355   CREATININE 1.8 (H) 03/14/2017 0810   CALCIUM  9.4 07/09/2023 0505   CALCIUM  8.3 (L) 06/18/2023 0334   CALCIUM  9.6 03/14/2017 0810   GFRNONAA 9 (L) 07/09/2023 0505   GFRNONAA 22 (L) 10/25/2022 1355   GFRNONAA 45 (L) 12/17/2013 1158   GFRAA 21 (L) 12/21/2019 0845   GFRAA 52 (L) 12/17/2013 1158   CBC    Component Value Date/Time   WBC 11.9 (H) 07/10/2023 0545   RBC 2.83 (L) 07/10/2023 0545   HGB 8.2 (L) 07/10/2023 0545   HGB 9.4 (L) 04/18/2023 1434   HGB 9.3 (L) 03/14/2017 0810   HCT 26.1 (L) 07/10/2023 0545   HCT 26.4 (L) 07/21/2019 0809   HCT 29.2 (L) 03/14/2017 0810   PLT 357 07/10/2023 0545   PLT 235 04/18/2023 1434   PLT 292 03/14/2017 0810   PLT 351 10/11/2016 1139   MCV 92.2 07/10/2023 0545   MCV 81.3 03/14/2017 0810   MCH 29.0 07/10/2023 0545   MCHC 31.4 07/10/2023 0545   RDW 16.6 (H) 07/10/2023 0545   RDW 14.0  03/14/2017 0810   LYMPHSABS 1.7 07/10/2023 0545   LYMPHSABS 1.4 03/14/2017 0810   MONOABS 0.7 07/10/2023 0545   MONOABS 0.4 03/14/2017 0810   EOSABS 0.0 07/10/2023 0545   EOSABS 0.1 03/14/2017 0810   BASOSABS 0.0 07/10/2023 0545   BASOSABS 0.0 03/14/2017 0810    HPI:  Pt is a 78 y.o.  with  hypertension, renal vein thrombosis on Eliquis , stroke, type II DM, lupus, CKD 4-5 who was recently admitted for CHF exacerbation and progressive CKD presented back with worsening dyspnea on exertion and fluid overload.      Assessment/Plan:   # Progressive CKD 5 versus AKI on CKD,: With frequent hospitalization for fluid overload.  Responding to IV diuretics but not po and with worsening  renal function d/w patient and family (Daughter on phone 4/2) at length recommend starting dialysis. S/p LUE AVG + TDC placement 4/4  -Continue dialysis on TTS schedule - CLIP complete-  has been accepted at [facility] - AVG removed as below   # CKD-MBD/hyperphosphatemia: Increased sevelamer  on 4/21. CTM. PTH at goal. Repeat on 5/1   #Anemia of CKD: Concern about GI bleed, underwent endoscopy t recently with no sign of bleeding.  Back on Eliquis .  Has required transfusion.  Given IV iron  and ESA   # Hypertension/volume: [name] improved after diuresis.  Torsemide  on non-dialysis days   #A fib: on eliquis    # Vascular access - developed steal syndrome of left hand. - VVS following s/p excision of AVG 07/01/23 due to concern for steal syndrome with improvement of symptoms and ok to resume anticoagulation.    #Disposition -now in CIR

## 2023-07-11 NOTE — Progress Notes (Signed)
 Physical Therapy Session Note  Patient Details  Name: [name] MRN: [id number] Date of Birth: 1945-05-24  Today's Date: 07/11/2023 PT Individual Time: [phone] and [phone] PT Individual Time Calculation (min): 56 min and 70 min  Short Term Goals: Week 1:  PT Short Term Goal 1 (Week 1): pt will transfer bed<>chair with LRAD and CGA PT Short Term Goal 2 (Week 1): pt will transfer sit<>stand with LRAD and CGA PT Short Term Goal 3 (Week 1): pt will ambulate 35ft with LRAD and CGA  Skilled Therapeutic Interventions/Progress Updates:   Treatment Session 1 Received pt semi-reclined in bed finishing breakfast. While pt ate, located 17x17 lightweight Helio A6 WC to trial. Pt agreeable to PT treatment and reported continued pain in R knee (unrated) - RN present at end of session to administer medications. Session with emphasis on functional mobility/transfers, toileting, generalized strengthening and endurance, and standing tolerance. Pt reported urge to void - donned R knee sleeve dependently and pt transferred semi-reclined<>sitting R EOB with HOB elevated and min A for RLE management. Pt required increased time, cues for sequencing, and encouragement as pt stating "I can't" initially. Donned shoes sitting EOB dependently with increased time due to increased pain in R knee with flexion. Stood from elevated EOB in [place] (due to urgency) with heavy mod A (pt lacking full knee and trunk extension) and transported to toilet with bedside commode over top dependently. Stood from [place] flaps with min A and dependent for brief management. Pt able to void minimally and donned pants sitting on toilet with total A. Stood from bedside commode in [place] with light mod A and required mod A to pull pants over hips (pt able to perform pericare standing with min A for balance). Transferred bedside commode<>WC in [place] dependnetly, stood from [place] flaps with min A, and transitioned into sitting with CGA. Doffed  nightgown with supervision and donned pull over shirt with min A. Concluded session with pt sitting in WC, needs within reach, and seatbelt alarm on. RN present at bedside.   Treatment Session 2 Received pt sitting in WC eating snack. Pt agreeable to PT treatment and reported pain 8/10 in R knee (premedicated) - provided pt with ice pack for pain relief at end of session. Session with emphasis on functional mobility/transfers, generalized strengthening and endurance, dynamic standing balance/coordination, NMR, and ambulation.   Pt instructed in WC mobility 171ft using BUE and supervision to dayroom with emphasis on UE strength and endurance - cues provided for more efficient propulsion strides. Stood from [facility] with RW x 2 trials (trial 1 with max A and trial 2 with mod A) with cues to scoot to edge of chair, tuck feet underneath her, and anterior weight shifting. Of note, pt with difficulty transitioning BUEs from [facility] armrests to RW. Pt ambulated 67ft x 1 and 45ft x 1 with RW and mod A with +2 for WC follow. Pt with bilateral knees flexed in stance and lacking trunk, hip, and knee extension despite cues for upright posture and to "straighten knees". Pt also required manual facilitation to advance RW as pt unable to follow through with cues for sequencing.   Transferred on/off Nustep with RW and mod A and performed seated BUE/BLE strengthening on Nustep at level 1 for 10 minutes for a total of 280 steps with emphasis on cardiovascular endurance and bilateral knee flexion ROM (RLE impaired more than LLE). Pt going at very slow pace but able to complete full ROM - total 0.2 miles with  avg METs 1.3. Pt then performed the following seated LE exercises with emphasis on strength/ROM: -active assisted heel slides 2x20 bilaterally - noted improvements in R knee flexion ROM after bike today -hip flexion 2x12 bilaterally (AAROM on RLE) -knee extension 2x12 bilaterally -heel raises 2x20 on LLE (only able to complete x10  with decreased ROM on RLE due to pain) Returned to room and concluded session with pt sitting in WC, needs within reach, and seatbelt alarm on.  Therapy Documentation Precautions:  Precautions Precautions: Fall Precaution/Restrictions Comments: dementia Restrictions Weight Bearing Restrictions Per Provider Order: No  Therapy/Group: Individual Therapy [name] Nena Bank PT, DPT 07/11/2023, 6:55 AM

## 2023-07-11 NOTE — Progress Notes (Signed)
 Occupational Therapy Session Note  Patient Details  Name: [name] MRN: [id number] Date of Birth: [birth year]  Today's Date: 07/11/2023 OT Individual Time: 0845-1000 OT Individual Time Calculation (min): 75 min    Short Term Goals: Week 1:  OT Short Term Goal 1 (Week 1): Pt will complete sit > stand in prep for ADL with CGA using LRAD OT Short Term Goal 2 (Week 1): Pt will complete toilet transfer with CGA using LRAD OT Short Term Goal 3 (Week 1): Pt will initiate toileting need with min questioning cues OT Short Term Goal 4 (Week 1): Pt will complete 1/3 toileting steps with min A for balance  Skilled Therapeutic Interventions/Progress Updates:   Patient received seated in wheelchair agreeable to OT session.  Patient reports she bathed and dressed this am with her PT.  Patient reports knee discomfort.  Transported to gym to address hand functioning.  Patient with stiffness in her hands - CMC arthritis in R hand.  Worked on very mild strengthening in BUE with foam blocks.  Worked on in hand manipulation to address dexterity.  Fabricated CMC support to right base of thumb, however- MD ordered splint which is likely more support to thumb, versus just at [facility].   Patient able to use UBE x 3 min at lowest setting while seated in wheelchair.  Patient indicates the gentle movement feels good.  Patient returned to room.  Family member visiting.  Left up in wheelchair with safety belt in place and engaged and call bell/ personal item sin reach.    Therapy Documentation Precautions:  Precautions Precautions: Fall Precaution/Restrictions Comments: dementia Restrictions Weight Bearing Restrictions Per Provider Order: No  Pain: Pain Assessment Pain Scale: 0-10 Pain Score: 8/10 right knee - MD discussing injection        Therapy/Group: Individual Therapy  Star Cheese M 07/11/2023, 3:32 PM

## 2023-07-11 NOTE — IPOC Note (Signed)
 Overall Plan of Care [facility]) Patient Details Name: [name] MRN: [id number] DOB: 1945/07/14  Admitting Diagnosis: [facility] Problems: Principal Problem:   Debility     Functional Problem List: Nursing Pain, Bowel, Safety, Endurance, Medication Management, Edema  PT Balance, Edema, Endurance, Motor, Nutrition, Pain, Perception, Safety, Skin Integrity  OT Balance, Cognition, Edema, Endurance, Motor, Pain, Safety  SLP    TR         Basic ADL's: OT Eating, Grooming, Bathing, Dressing, Toileting     Advanced  ADL's: OT       Transfers: PT Bed Mobility, Bed to Chair, Car, Occupational psychologist, Research scientist (life sciences): PT Ambulation, Psychologist, prison and probation services, Stairs     Additional Impairments: OT Fuctional Use of Upper Extremity  SLP        TR      Anticipated Outcomes Item Anticipated Outcome  Self Feeding Supervision  Swallowing      Basic self-care  Supervision  Toileting  Supervision   Bathroom Transfers Supervision, CGA  Bowel/Bladder  manage bowel w mod I  Transfers  supervision with LRAD  Locomotion  supervision with LRAD  Communication     Cognition     Pain  manage pain < 4 with prns  Safety/Judgment  manage safety w cues   Therapy Plan: PT Intensity: Minimum of 1-2 x/day ,45 to 90 minutes PT Frequency: 5 out of 7 days PT Duration Estimated Length of Stay: 12-14 days OT Intensity: Minimum of 1-2 x/day, 45 to 90 minutes OT Frequency: 5 out of 7 days OT Duration/Estimated Length of Stay: 12-14 days     Team Interventions: Nursing Interventions Patient/Family Education, Pain Management, Medication Management, Discharge Planning, Disease Management/Prevention, Bowel Management  PT interventions Ambulation/gait training, Discharge planning, Functional mobility training, Therapeutic Activities, Balance/vestibular training, Disease management/prevention, Neuromuscular re-education, Skin care/wound management, Therapeutic Exercise,  Wheelchair propulsion/positioning, Cognitive remediation/compensation, DME/adaptive equipment instruction, Pain management, Splinting/orthotics, UE/LE Strength taining/ROM, Community reintegration, Equities trader education, Museum/gallery curator, UE/LE Coordination activities  OT Interventions Warden/ranger, Discharge planning, Pain management, Self Care/advanced ADL retraining, Therapeutic Activities, UE/LE Coordination activities, Functional electrical stimulation, Cognitive remediation/compensation, Disease mangement/prevention, Functional mobility training, Patient/family education, Skin care/wound managment, Therapeutic Exercise, DME/adaptive equipment instruction, Neuromuscular re-education, UE/LE Strength taining/ROM, Wheelchair propulsion/positioning  SLP Interventions    TR Interventions    SW/CM Interventions Discharge Planning, Psychosocial Support, Patient/Family Education   Barriers to Discharge MD  Medical stability  Nursing Decreased caregiver support 2 level main B+B w SIL, had assist for medications, driving, was to get grabbar and shower chair after last stay,  PT Inaccessible home environment, Decreased caregiver support, Home environment access/layout, Hemodialysis, Other (comments) dementia, unsure of home set up or D/C plan due to pt's dementia - pt reports 4 STE with 2 handrail and states she lives with son and dtr in law who work and she will be alone sometimes during the day.  OT Home environment access/layout, Inaccessible home environment, Hemodialysis, Incontinence    SLP      SW Decreased caregiver support, Lack of/limited family support, Community education officer for SNF coverage     Team Discharge Planning: Destination: PT-Home ,OT- Home , SLP-  Projected Follow-up: PT-Home health PT, OT-  24 hour supervision/assistance, Home health OT, SLP-  Projected Equipment Needs: PT-To be determined, OT- To be determined, SLP-  Equipment Details: PT-need to confirm DME at home, OT-   Patient/family involved in discharge planning: PT- Patient,  OT-Patient, Family member/caregiver, SLP-   MD ELOS: 10-14 days  Medical Rehab Prognosis:  Excellent Assessment: The patient has been admitted for CIR therapies with the diagnosis of debility 2/2 respiratory failure. The team will be addressing functional mobility, strength, stamina, balance, safety, adaptive techniques and equipment, self-care, bowel and bladder mgt, patient and caregiver education. Goals have been set at supervision. Anticipated discharge destination is home.       See Team Conference Notes for weekly updates to the plan of care

## 2023-07-11 NOTE — Progress Notes (Signed)
   07/11/23 1834  Vitals  Temp 98.5 F (36.9 C)  Pulse Rate 91  Resp 11  BP (!) 145/65  SpO2 100 %  Post Treatment  Dialyzer Clearance Lightly streaked  Hemodialysis Intake (mL) 0 mL  Liters Processed 53.4  Fluid Removed (mL) 2000 mL  Tolerated HD Treatment Yes   Received patient in bed to unit.  Alert and oriented.  Informed consent signed and in chart.   TX duration:3.5hrs  Patient tolerated well.  Transported back to the room  Alert, without acute distress.  Hand-off given to patient's nurse.   Access used: [facility] Access issues: none  Total UF removed: 2L Medication(s) given: vanc    [facility]

## 2023-07-12 MED ORDER — AMOXICILLIN-POT CLAVULANATE 500-125 MG PO TABS: 1.0000 | ORAL_TABLET | ORAL | Status: DC

## 2023-07-12 MED ORDER — DARBEPOETIN ALFA 200 MCG/0.4ML IJ SOSY
200.0000 ug | PREFILLED_SYRINGE | INTRAMUSCULAR | Status: DC
  Administered 2023-07-14 – 2023-07-27 (×3): 200 ug via SUBCUTANEOUS
  Filled 2023-07-12 (×3): qty 0.4

## 2023-07-12 NOTE — Progress Notes (Signed)
 Physical Therapy Session Note  Patient Details  Name: [name] MRN: [id number] Date of Birth: 12/28/1945  Today's Date: 07/12/2023 PT Individual Time: [phone] and 0914-1010 PT Individual Time Calculation (min): 26 min and 56 min   Short Term Goals: Week 1:  PT Short Term Goal 1 (Week 1): pt will transfer bed<>chair with LRAD and CGA PT Short Term Goal 2 (Week 1): pt will transfer sit<>stand with LRAD and CGA PT Short Term Goal 3 (Week 1): pt will ambulate 55ft with LRAD and CGA  Skilled Therapeutic Interventions/Progress Updates:   Treatment Session 1 Received pt semi-reclined in bed, pt agreeable to PT treatment, and reported pain 7/10 in R knee - RN notified. Session with emphasis on functional mobility/transfers, dressing, generalized strengthening and endurance, and standing tolerance/balance. Attempted to locate R knee sleeve but unable to find - therefore ace wrapped R knee for support. Pt transferred supine<>sitting R EOB from flat bed with supervision and increased time using bedrails. Found knee sleeve - removed ace wrap and donned R knee sleeve sitting EOB with total A. Donned pants sitting EOB with max A, then stood from EOB with RW and mod A and required max A to pull over hips. Performed stand<>pivot into WC with RW and heavy min A (pt still unable to fully extend knees) and sat in WC at sink and brushed teeth with supervision. Concluded session with pt sitting in WC, needs within reach, and seatbelt alarm on.   Treatment Session 2 Received pt sitting in WC, pt agreeable to PT treatment, and reported pain 8/10 in R knee - RN notified of request for pain medication. Session with emphasis on functional mobility/transfers, generalized strengthening and endurance, standing balance/tolerance, WC mobility, and ambulation. Pt performed WC mobility 164ft using BUE and supervision with increased time to main therapy gym (cues to increase propulsion stride) with emphasis on BUE  strength and coordination.  Stood from [facility] with RW and mod A x 3 trials and ambulated 50ft x 1, 74ft x 1, and 25ft x 1 with RW and min A with WC follow. Pt continues to stand with bilateral knees flexed in stance with moderate trunk flexion. Pt unable to extend knees despite cues and ultimately limited by knee pain. Cues provided to boost up with UEs when stepping to take pressure off R knee, but pt with poor carry over with cues. MD arrived for morning rounds and reported plan for steroid injection and aspiration today. RN arrived to administer medications, then performed seated BLE strengthening on Kinetron at 20 cm/sec for 1 minute x 5 trials with emphasis on glute/quad strength and knee flexion ROM. Transported back to room in [facility] dependently and Nephrology arrived for brief assessment, Concluded session with pt sitting in [facility], needs within reach, and seatbelt alarm on. Ice pack placed on R knee for pain relief.   Therapy Documentation Precautions:  Precautions Precautions: Fall Precaution/Restrictions Comments: dementia Restrictions Weight Bearing Restrictions Per Provider Order: No  Therapy/Group: Individual Therapy [name] Nena Bank PT, DPT 07/12/2023, 6:52 AM

## 2023-07-12 NOTE — Progress Notes (Signed)
 Occupational Therapy Session Note  Patient Details  Name: Ifrah [place] Winberg MRN: [id number] Date of Birth: 1945/11/02  Today's Date: 07/12/2023 OT Individual Time: 1030-1110 OT Individual Time Calculation (min): 40 min    Short Term Goals: Week 1:  OT Short Term Goal 1 (Week 1): Pt will complete sit > stand in prep for ADL with CGA using LRAD OT Short Term Goal 2 (Week 1): Pt will complete toilet transfer with CGA using LRAD OT Short Term Goal 3 (Week 1): Pt will initiate toileting need with min questioning cues OT Short Term Goal 4 (Week 1): Pt will complete 1/3 toileting steps with min A for balance   Skilled Therapeutic Interventions/Progress Updates:    Pt up in wheelchair at time of session, declined ADL needs. Pt reporting 7/10 pain in R knee during standing activities, returning self to sitting with some relief. Pt applying lotion, sock, shoe to L foot but needing assist with all of these for the R foot 2/2 knee pain. Sit <> stand in room with RW with MOD A for power up and difficulty transitioning both hands to RW, able to stand for approx 1 minute with mild weight shifting but pain too bad and returned to sitting. Focused on wheelchair mobility outside for fresh air and uplifing mood - performing with MIN A and educated on turning, would benefit from further practice. Up in wheelchair at end of session alarm on call bell in reach.   Therapy Documentation Precautions:  Precautions Precautions: Fall Precaution/Restrictions Comments: dementia Restrictions Weight Bearing Restrictions Per Provider Order: No    Therapy/Group: Individual Therapy  Doroteo Gasmen 07/12/2023, 11:15 AM

## 2023-07-12 NOTE — Progress Notes (Signed)
 Sent message to Starr Eddy PA and attached oncoming nurse reminding for injection patient is suppose to get for knee pain.

## 2023-07-12 NOTE — Progress Notes (Signed)
 Occupational Therapy Session Note  Patient Details  Name: [name] MRN: [id number] Date of Birth: Jun 02, 1945  Today's Date: 07/12/2023 OT Individual Time: 4098-1191 & 1305-1330 OT Individual Time Calculation (min): 40 min & 25 min   Short Term Goals: Week 1:  OT Short Term Goal 1 (Week 1): Pt will complete sit > stand in prep for ADL with CGA using LRAD OT Short Term Goal 2 (Week 1): Pt will complete toilet transfer with CGA using LRAD OT Short Term Goal 3 (Week 1): Pt will initiate toileting need with min questioning cues OT Short Term Goal 4 (Week 1): Pt will complete 1/3 toileting steps with min A for balance  Skilled Therapeutic Interventions/Progress Updates:  Session 1 Skilled OT intervention completed with focus on pain management, sit > stands, standing tolerance and dynamic standing balance. Pt received seated in w/c, agreeable to session. R knee and R thumb pain reported; pre-medicated. OT offered rest breaks, repositioning throughout for pain reduction.  Pt declined self-care needs. Donned tennis shoes with max A for joint support in prep for mobility. Pt with universal R wrist splint in room, however pt's location of pain is at thumb [facility] joint therefore recommend at thumb spica splint to immobilize the thumb for reduction of pain/swelling. MD notified of request for new splint.  Transported dependently in w/c <> gym. To promote sit > stands and standing tolerance for toileting, pt stood with heavy min A at elevated table with mod cues for scooting forward, BLE placement underneath her, hand placement and anterior weight shifting. In stance with min A, pt placed colored cones on matching colored dots for cognitive component. Initial min A required for cognitive aspect of task, and cues needed for hip extension and avoidance of leaning elbows on table however pt unable to correct bilateral knee flexion due to pain. Intermittent seated rest needed, then pt completed additional  3 sit > stands with min A and prior cueing and min A for dynamic standing balance.  Pt sorted cones/dots as called out by therapist to address one step command following and STM skills needed for BADLs.  Back in room pt remained seated in w/c, with belt alarm on/activated, and with all needs in reach at end of session.  Session 2 Skilled OT intervention completed with focus on ADL retraining, functional sit > stands. Pt received seated in w/c, agreeable to session. R knee pain reported with weight bearing; pre-medicated. OT offered rest breaks, repositioning throughout for pain reduction.  With prompting by OT, pt agreeable to use bathroom to manage incontinence. Utilized stedy for efficiency and pain management. Pt required one step commands for hand placement and body positioning including hip extension, and overall mod A to stand in stedy. Stedy transfer > toilet. Required min A to stand from perched position with again cues for hip extension and max A to lower clothing. Pt was continent of urinary void but also small BM smear noted in brief with pt able to detect. Max cues, then pt able to doff brief with min A, rethread new one with max A. Pt completed pericare with supervision seated, however difficulty accessing therefore stood with mod A in stedy and min A needed for dynamic balance while donning clothing over hips with mod A. Stedy transfer > sink with supervision for hand hygiene. Stedy transfer > w/c and CGA sit > stand from perched position.   Pt remained seated in w/c, with belt alarm on/activated, and with all needs in reach at end  of session.   Therapy Documentation Precautions:  Precautions Precautions: Fall Precaution/Restrictions Comments: dementia Restrictions Weight Bearing Restrictions Per Provider Order: No     Therapy/Group: Individual Therapy  Latwan Luchsinger E Ayiana Winslett, MS, OTR/L  07/12/2023, 1:32 PM

## 2023-07-12 NOTE — Plan of Care (Signed)
  Problem: Consults Goal: RH GENERAL PATIENT EDUCATION Description: See Patient Education module for education specifics. Outcome: Progressing   Problem: RH BOWEL ELIMINATION Goal: RH STG MANAGE BOWEL WITH ASSISTANCE Description: STG Manage Bowel with mod I Assistance. Outcome: Progressing   Problem: RH BOWEL ELIMINATION Goal: RH STG MANAGE BOWEL W/MEDICATION W/ASSISTANCE Description: STG Manage Bowel with Medication with mod I Assistance. Outcome: Progressing   Problem: RH SAFETY Goal: RH STG ADHERE TO SAFETY PRECAUTIONS W/ASSISTANCE/DEVICE Description: STG Adhere to Safety Precautions With cues Assistance/Device. Outcome: Progressing   Problem: RH PAIN MANAGEMENT Goal: RH STG PAIN MANAGED AT OR BELOW PT'S PAIN GOAL Description: < 4 with prns Outcome: Progressing   Problem: RH KNOWLEDGE DEFICIT GENERAL Goal: RH STG INCREASE KNOWLEDGE OF SELF CARE AFTER HOSPITALIZATION Description: Patient and family will be able to manage care at discharge using educational resources for medications, dietary modification recommendations independently Outcome: Progressing

## 2023-07-12 NOTE — Progress Notes (Signed)
 Patient ID: [name], female   DOB: 1945/04/19, 78 y.o.   MRN: [id number] S: Feels well today. May have knee aspiration and/or injection.  O:BP (!) 140/68 (BP Location: Right Arm)   Pulse 77   Temp 98.7 F (37.1 C)   Resp 17   Ht 5\' 4"  (1.626 m)   Wt 72.5 kg   SpO2 94%   BMI 27.44 kg/m   Intake/Output Summary (Last 24 hours) at 07/12/2023 1407 Last data filed at 07/12/2023 1324 Gross per 24 hour  Intake 898 ml  Output 2000 ml  Net -1102 ml   Intake/Output: I/O last 3 completed shifts: In: 600 [P.O.:600] Out: 2000 [Other:2000]  Intake/Output this shift:  Total I/O In: 538 [P.O.:538] Out: -  Weight change: 4.3 kg Gen: NAD lying in bed CVS: Normal rate, no rub Resp: Bilateral chest rise with no increased work of breathing Abd: +BS, soft, NT/ND Ext: no edema, warm and well-perfused  Recent Labs  Lab 07/07/23 0759 07/08/23 0519 07/09/23 0505  NA 135 132* 132*  K 4.6 4.4 4.2  CL 97* 93* 95*  CO2 19* 19* 23  GLUCOSE 110* 107* 84  BUN 58* 69* 36*  CREATININE 5.56* 6.54* 4.66*  ALBUMIN  2.1* 1.9* 2.1*  CALCIUM  9.4 8.8* 9.4  PHOS 7.1* 7.5* 6.7*   Liver Function Tests: Recent Labs  Lab 07/07/23 0759 07/08/23 0519 07/09/23 0505  ALBUMIN  2.1* 1.9* 2.1*   No results for input(s): "LIPASE", "AMYLASE" in the last 168 hours. No results for input(s): "AMMONIA" in the last 168 hours. CBC: Recent Labs  Lab 07/06/23 0554 07/07/23 0759 07/08/23 0519 07/09/23 0505 07/10/23 0545  WBC 12.7* 11.2* 10.5 11.9* 11.9*  NEUTROABS  --  8.8* 8.5*  --  9.2*  HGB 8.3* 8.5* 8.0* 8.6* 8.2*  HCT 26.0* 28.0* 25.5* 27.8* 26.1*  MCV 91.9 94.0 92.1 92.1 92.2  PLT 246 276 296 348 357   Cardiac Enzymes: No results for input(s): "CKTOTAL", "CKMB", "CKMBINDEX", "TROPONINI" in the last 168 hours. CBG: Recent Labs  Lab 07/08/23 1759 07/09/23 0634 07/09/23 1213 07/09/23 2039 07/10/23 2048  GLUCAP 144* 83 165* 161* 157*    Iron  Studies: No results for input(s): "IRON ",  "TIBC", "TRANSFERRIN", "FERRITIN" in the last 72 hours. Studies/Results: DG Knee Complete 4 Views Right Result Date: 07/10/2023 CLINICAL DATA:  Chronic right knee pain. Worsened over the past few days. EXAM: RIGHT KNEE - COMPLETE 4+ VIEW COMPARISON:  None Available. FINDINGS: Moderate medial and mild to moderate lateral compartment chondrocalcinosis. Mild tricompartmental joint space narrowing. Mild superior greater than inferior patellar degenerative spurring. Moderate knee joint effusion. No acute fracture or dislocation. Moderate atherosclerotic calcifications. IMPRESSION: 1. Mild tricompartmental osteoarthritis. 2. Moderate knee joint effusion. 3. Moderate medial and mild to moderate lateral compartment chondrocalcinosis. This is nonspecific and may be secondary to osteoarthritis, calcium  pyrophosphate dihydrate deposition disease (CPPD), among other etiologies. Electronically Signed   By: Bertina Broccoli M.D.   On: 07/10/2023 17:49     amoxicillin -clavulanate  1 tablet Oral Q24H   apixaban   5 mg Oral BID   B-complex with vitamin C  1 tablet Oral Daily   bupivacaine (PF)  10 mL Infiltration Once   Chlorhexidine  Gluconate Cloth  6 each Topical Q12H   Chlorhexidine  Gluconate Cloth  6 each Topical Q0600   cholecalciferol   1,000 Units Oral Daily   colestipol   1 g Oral TID AC   [START ON 07/14/2023] darbepoetin (ARANESP ) injection - DIALYSIS  150 mcg Subcutaneous Q Sun-1800   diclofenac   Sodium  2 g Topical QID   FLUoxetine   20 mg Oral Daily   gabapentin   300 mg Oral QHS   l-methylfolate-B6-B12  1 tablet Oral Daily   lipase/protease/amylase  36,000 Units Oral TID AC   methylPREDNISolone  acetate  40 mg Intra-articular Once   mirtazapine   7.5 mg Oral QHS   multivitamin  1 tablet Oral QHS   pantoprazole   40 mg Oral BID   rivastigmine   1.5 mg Oral BID   sevelamer  carbonate  1,600 mg Oral TID WC   torsemide   100 mg Oral Q M,W,F   And   [START ON 07/14/2023] torsemide   100 mg Oral Q Sun    BMET     Component Value Date/Time   NA 132 (L) 07/09/2023 0505   NA 144 07/19/2020 1458   NA 140 03/14/2017 0810   K 4.2 07/09/2023 0505   K 3.8 03/14/2017 0810   CL 95 (L) 07/09/2023 0505   CO2 23 07/09/2023 0505   CO2 27 03/14/2017 0810   GLUCOSE 84 07/09/2023 0505   GLUCOSE 177 (H) 03/14/2017 0810   BUN 36 (H) 07/09/2023 0505   BUN 85 (HH) 07/19/2020 1458   BUN 35.6 (H) 03/14/2017 0810   CREATININE 4.66 (H) 07/09/2023 0505   CREATININE 2.31 (H) 10/25/2022 1355   CREATININE 1.8 (H) 03/14/2017 0810   CALCIUM  9.4 07/09/2023 0505   CALCIUM  8.3 (L) 06/18/2023 0334   CALCIUM  9.6 03/14/2017 0810   GFRNONAA 9 (L) 07/09/2023 0505   GFRNONAA 22 (L) 10/25/2022 1355   GFRNONAA 45 (L) 12/17/2013 1158   GFRAA 21 (L) 12/21/2019 0845   GFRAA 52 (L) 12/17/2013 1158   CBC    Component Value Date/Time   WBC 11.9 (H) 07/10/2023 0545   RBC 2.83 (L) 07/10/2023 0545   HGB 8.2 (L) 07/10/2023 0545   HGB 9.4 (L) 04/18/2023 1434   HGB 9.3 (L) 03/14/2017 0810   HCT 26.1 (L) 07/10/2023 0545   HCT 26.4 (L) 07/21/2019 0809   HCT 29.2 (L) 03/14/2017 0810   PLT 357 07/10/2023 0545   PLT 235 04/18/2023 1434   PLT 292 03/14/2017 0810   PLT 351 10/11/2016 1139   MCV 92.2 07/10/2023 0545   MCV 81.3 03/14/2017 0810   MCH 29.0 07/10/2023 0545   MCHC 31.4 07/10/2023 0545   RDW 16.6 (H) 07/10/2023 0545   RDW 14.0 03/14/2017 0810   LYMPHSABS 1.7 07/10/2023 0545   LYMPHSABS 1.4 03/14/2017 0810   MONOABS 0.7 07/10/2023 0545   MONOABS 0.4 03/14/2017 0810   EOSABS 0.0 07/10/2023 0545   EOSABS 0.1 03/14/2017 0810   BASOSABS 0.0 07/10/2023 0545   BASOSABS 0.0 03/14/2017 0810    HPI:  Pt is a 78 y.o.  with  hypertension, renal vein thrombosis on Eliquis , stroke, type II DM, lupus, CKD 4-5 who was recently admitted for CHF exacerbation and progressive CKD presented back with worsening dyspnea on exertion and fluid overload.      Assessment/Plan:   # Progressive CKD 5 versus AKI on CKD,: With frequent  hospitalization for fluid overload.  Responding to IV diuretics but not po and with worsening renal function d/w patient and family (Daughter on phone 4/2) at length recommend starting dialysis. S/p LUE AVG + TDC placement 4/4  -Continue dialysis on TTS schedule - CLIP complete-  has been accepted at [facility] - AVG removed as below  **We will not plan to see the patient over the weekend but are available if needed. Will  plan on dialysis tomorrow.   # CKD-MBD/hyperphosphatemia: Increased sevelamer  on 4/21. CTM. PTH at goal. Repeat on 5/1   #Anemia of CKD: Concern about GI bleed, underwent endoscopy t recently with no sign of bleeding.  Back on Eliquis .  Has required transfusion.  Given IV iron  and ESA   # Hypertension/volume: [name] improved after diuresis.  Torsemide  on non-dialysis days   #A fib: on eliquis    # Vascular access - developed steal syndrome of left hand. - VVS following s/p excision of AVG 07/01/23 due to concern for steal syndrome with improvement of symptoms and ok to resume anticoagulation.    #Disposition -now in CIR

## 2023-07-12 NOTE — Progress Notes (Signed)
 PROGRESS NOTE   Subjective/Complaints: Messaged [name] and he is planning to perform right knee injection today, observed therapy and patient is extremely limited by knee pain  ROS: +upper and lower extremity weakness, +bowel incontinence, +right knee pain-limits therapy   Objective:   DG Knee Complete 4 Views Right Result Date: 07/10/2023 CLINICAL DATA:  Chronic right knee pain. Worsened over the past few days. EXAM: RIGHT KNEE - COMPLETE 4+ VIEW COMPARISON:  None Available. FINDINGS: Moderate medial and mild to moderate lateral compartment chondrocalcinosis. Mild tricompartmental joint space narrowing. Mild superior greater than inferior patellar degenerative spurring. Moderate knee joint effusion. No acute fracture or dislocation. Moderate atherosclerotic calcifications. IMPRESSION: 1. Mild tricompartmental osteoarthritis. 2. Moderate knee joint effusion. 3. Moderate medial and mild to moderate lateral compartment chondrocalcinosis. This is nonspecific and may be secondary to osteoarthritis, calcium  pyrophosphate dihydrate deposition disease (CPPD), among other etiologies. Electronically Signed   By: Bertina Broccoli M.D.   On: 07/10/2023 17:49   Recent Labs    07/10/23 0545  WBC 11.9*  HGB 8.2*  HCT 26.1*  PLT 357   No results for input(s): "NA", "K", "CL", "CO2", "GLUCOSE", "BUN", "CREATININE", "CALCIUM " in the last 72 hours.   Intake/Output Summary (Last 24 hours) at 07/12/2023 1021 Last data filed at 07/12/2023 0800 Gross per 24 hour  Intake 718 ml  Output 2000 ml  Net -1282 ml        Physical Exam: Vital Signs Blood pressure (!) 140/68, pulse 77, temperature 98.7 F (37.1 C), resp. rate 17, height 5\' 4"  (1.626 m), weight 72.5 kg, SpO2 94%. Gen: no distress, normal appearing HEENT: oral mucosa pink and moist, NCAT Cardio: Reg rate Chest: normal effort, normal rate of breathing Abd: soft,  non-distended Ext: no edema Musculoskeletal:        General: Swelling and tenderness (left antecubital region) present.  Right knee swollen and tender    Cervical back: Normal range of motion.  Skin:    Comments: AVF site CDI. No erythema. Right heel boggy  Neurological:     Mental Status: She is alert.     Comments: Oriented to self only. Unable to recall age, DOB or place. Poor recall and decrease insight into deficits. She was able to follow simple motor commands. No CN deficits. Limited RUE d/t HD catheter. LUE limited d/t pain. Grip/wrist at least 3-4/5 bilaterally. BLE 3- to 3/5 HF, KE and 4/5 ADF/PF. Decreased LT from ankles down bilaterally. No abnl resting tone. DTR's tr to 1+, stable 4/24 Psychiatric:     Comments: Pt flat, cooperative     Assessment/Plan: 1. Functional deficits which require 3+ hours per day of interdisciplinary therapy in a comprehensive inpatient rehab setting. Physiatrist is providing close team supervision and 24 hour management of active medical problems listed below. Physiatrist and rehab team continue to assess barriers to discharge/monitor patient progress toward functional and medical goals  Care Tool:  Bathing    Body parts bathed by patient: Right arm, Left arm, Chest, Abdomen, Face   Body parts bathed by helper: Front perineal area, Buttocks, Right upper leg, Right lower leg, Left lower leg, Left upper leg     Bathing assist  Assist Level: Maximal Assistance - Patient 24 - 49%     Upper Body Dressing/Undressing Upper body dressing   What is the patient wearing?: Pull over shirt, Bra    Upper body assist Assist Level: Maximal Assistance - Patient 25 - 49%    Lower Body Dressing/Undressing Lower body dressing      What is the patient wearing?: Incontinence brief, Pants     Lower body assist Assist for lower body dressing: Maximal Assistance - Patient 25 - 49%     Toileting Toileting    Toileting assist Assist for toileting:  Total Assistance - Patient < 25%     Transfers Chair/bed transfer  Transfers assist     Chair/bed transfer assist level: Minimal Assistance - Patient > 75%     Locomotion Ambulation   Ambulation assist      Assist level: 2 helpers Assistive device: Walker-rolling Max distance: 41ft   Walk 10 feet activity   Assist  Walk 10 feet activity did not occur: Safety/medical concerns (pain)  Assist level: 2 helpers Assistive device: Walker-rolling   Walk 50 feet activity   Assist Walk 50 feet with 2 turns activity did not occur: Safety/medical concerns (pain)         Walk 150 feet activity   Assist Walk 150 feet activity did not occur: Safety/medical concerns (pain)         Walk 10 feet on uneven surface  activity   Assist Walk 10 feet on uneven surfaces activity did not occur: Safety/medical concerns (pain)         Wheelchair     Assist Is the patient using a wheelchair?: Yes Type of Wheelchair: Manual (lightweight)    Wheelchair assist level: Supervision/Verbal cueing Max wheelchair distance: 136ft    Wheelchair 50 feet with 2 turns activity    Assist        Assist Level: Supervision/Verbal cueing   Wheelchair 150 feet activity     Assist      Assist Level: Supervision/Verbal cueing   Blood pressure (!) 140/68, pulse 77, temperature 98.7 F (37.1 C), resp. rate 17, height 5\' 4"  (1.626 m), weight 72.5 kg, SpO2 94%.  Medical Problem List and Plan: 1. Functional deficits secondary to debility after acute respiratory failure and multiple medical issues             -patient may not yet shower             -ELOS/Goals: 10-14 days, supervision goals with PT, OT, SLP  Left message for daughter to update her 2.  Antithrombotics: -DVT/anticoagulation:  Pharmaceutical: Eliquis              -antiplatelet therapy: N/A 3. Acute on chronic LBP/Pain Management: Continue Hydrocodone  prn severe pain--monitor for sedation/confusion.               --Was on IV dilaudid  till 04/16-->now on hydrocodone  10 TID on average.              --increased use of Zanaflex  in past 48 hours.              --continue Gabapentin  300 mg/hs 4. Hx of depression/anxiety/Sleep: LCSW to follow for evaluation and support             --melatonin prn for insomnia.              -antipsychotic agents: N/A             -pt on chronic prozac  and remeron  5. Neuropsych/cognition:  This patient is not capable of making decisions on her own behalf. 6. Skin/Wound Care: Routine pressure relief measures.              -float both heels 7. Fluids/Electrolytes/Nutrition: Strict I/O. Labs with HD             --1200 cc FR/day.  8. Cellulitis/Possible abscess LUE: On Van since 04/19 and Zosyn  since 04/17             --WBC trending down to 10.5 today. BC X 2 from 04/19 pending--negative so far.              --has been afebrile X 24 hours.  Zosyn  changed to Augmentin  by Dr. Sunnie England. End date for augmentin  is 4/29              -pt reports that arm is feeling less tender  9. H/o GIB: Monitor H/H as had further drop. Hgb down to             On IV iron /ESA  10. HTN: Monitor BP TID--on Torsemide  on non dialysis days.   11. 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 H/o Gastritis/esophagitis: Most recent admission last month. Continue PPI BID.  --Has required one unit PRBC 04/16.              --monitor for signs of bleeding.   13. Chronic diarrhea:  continue Colestid  and Creon  tid- changed to after meals as per daughter's request  14. H/o cognitive/memory deficits: continue Exelon  bid.    15. ESRD: Now HD dependent--TTS at the end of the day to help with tolerance of therapy             --continue renveal for metabolic bone disease.    17. T2DM: Hgb A1c- 6.2 and diet controlled. Add CM restrictions.  -d/c ISS, d/c CBGs  18. Bilateral knee pain: voltaren  gel scheduled, right knee sleeve ordered, consulted ortho for right knee steroid injection, discussed  that there is plan for this today  19. Bowel incontinence: lumbar MRI showed and discussed there is no evidence of cauda equina  20. New upper extremity weakness: cervical MRI ordered and discussed that there is moderate canal stenosis, discussed with daughter that oral steroids, steroid injection, surgery are possible interventions- deferred at this time given patient's risk factors  LOS: 4 days A FACE TO FACE EVALUATION WAS PERFORMED  [name] P [name] 07/12/2023, 10:21 AM

## 2023-07-13 DIAGNOSIS — M4802 Spinal stenosis, cervical region: Secondary | ICD-10-CM

## 2023-07-13 DIAGNOSIS — M48062 Spinal stenosis, lumbar region with neurogenic claudication: Secondary | ICD-10-CM

## 2023-07-13 LAB — CBC WITH DIFFERENTIAL/PLATELET
Abs Immature Granulocytes: 0.28 10*3/uL — ABNORMAL HIGH (ref 0.00–0.07)
Basophils Absolute: 0.1 10*3/uL (ref 0.0–0.1)
Basophils Relative: 0 %
Eosinophils Absolute: 0 10*3/uL (ref 0.0–0.5)
Eosinophils Relative: 0 %
HCT: 25.2 % — ABNORMAL LOW (ref 36.0–46.0)
Hemoglobin: 7.9 g/dL — ABNORMAL LOW (ref 12.0–15.0)
Immature Granulocytes: 1 %
Lymphocytes Relative: 6 %
Lymphs Abs: 1.1 10*3/uL (ref 0.7–4.0)
MCH: 29.4 pg (ref 26.0–34.0)
MCHC: 31.3 g/dL (ref 30.0–36.0)
MCV: 93.7 fL (ref 80.0–100.0)
Monocytes Absolute: 0.8 10*3/uL (ref 0.1–1.0)
Monocytes Relative: 4 %
Neutro Abs: 17.4 10*3/uL — ABNORMAL HIGH (ref 1.7–7.7)
Neutrophils Relative %: 89 %
Platelets: 476 10*3/uL — ABNORMAL HIGH (ref 150–400)
RBC: 2.69 MIL/uL — ABNORMAL LOW (ref 3.87–5.11)
RDW: 16.8 % — ABNORMAL HIGH (ref 11.5–15.5)
WBC: 19.7 10*3/uL — ABNORMAL HIGH (ref 4.0–10.5)
nRBC: 0.1 % (ref 0.0–0.2)

## 2023-07-13 LAB — COMPREHENSIVE METABOLIC PANEL WITH GFR
ALT: 48 U/L — ABNORMAL HIGH (ref 0–44)
AST: 47 U/L — ABNORMAL HIGH (ref 15–41)
Albumin: 2 g/dL — ABNORMAL LOW (ref 3.5–5.0)
Alkaline Phosphatase: 222 U/L — ABNORMAL HIGH (ref 38–126)
Anion gap: 20 — ABNORMAL HIGH (ref 5–15)
BUN: 95 mg/dL — ABNORMAL HIGH (ref 8–23)
CO2: 20 mmol/L — ABNORMAL LOW (ref 22–32)
Calcium: 9.5 mg/dL (ref 8.9–10.3)
Chloride: 93 mmol/L — ABNORMAL LOW (ref 98–111)
Creatinine, Ser: 8.2 mg/dL — ABNORMAL HIGH (ref 0.44–1.00)
GFR, Estimated: 5 mL/min — ABNORMAL LOW (ref >60)
Glucose, Bld: 238 mg/dL — ABNORMAL HIGH (ref 70–99)
Potassium: 3.7 mmol/L (ref 3.5–5.1)
Sodium: 133 mmol/L — ABNORMAL LOW (ref 135–145)
Total Bilirubin: 0.7 mg/dL (ref 0.0–1.2)
Total Protein: 6.8 g/dL (ref 6.5–8.1)

## 2023-07-13 MED ORDER — LIDOCAINE-PRILOCAINE 2.5-2.5 % EX CREA: 1.0000 | TOPICAL_CREAM | CUTANEOUS | Status: DC | PRN

## 2023-07-13 MED ORDER — VANCOMYCIN HCL 750 MG/150ML IV SOLN
INTRAVENOUS | Status: AC
  Filled 2023-07-13: qty 150

## 2023-07-13 MED ORDER — ALTEPLASE 2 MG IJ SOLR: 2.0000 mg | Freq: Once | INTRAMUSCULAR | Status: DC | PRN

## 2023-07-13 MED ORDER — AMOXICILLIN-POT CLAVULANATE 500-125 MG PO TABS
1.0000 | ORAL_TABLET | ORAL | Status: DC
  Administered 2023-07-14: 1 via ORAL
  Filled 2023-07-13 (×2): qty 1

## 2023-07-13 MED ORDER — LIDOCAINE HCL (PF) 1 % IJ SOLN: 5.0000 mL | INTRAMUSCULAR | Status: DC | PRN

## 2023-07-13 MED ORDER — BUPIVACAINE HCL (PF) 0.5 % IJ SOLN
10.0000 mL | Freq: Once | INTRAMUSCULAR | Status: DC
  Filled 2023-07-13: qty 10

## 2023-07-13 MED ORDER — PENTAFLUOROPROP-TETRAFLUOROETH EX AERO: 1.0000 | INHALATION_SPRAY | CUTANEOUS | Status: DC | PRN

## 2023-07-13 NOTE — Progress Notes (Signed)
 Resume UF. Bp improved. Continual art alarms

## 2023-07-13 NOTE — Progress Notes (Signed)
 PROGRESS NOTE   Subjective/Complaints:  No issues overnite , discussed chronic lower ext pain issues as thery relate to lumbar stenosis, we discussed expect progression with time   ROS: +upper and lower extremity weakness, +bowel incontinence, +right knee pain-limits therapy   Objective:   No results found.  No results for input(s): "WBC", "HGB", "HCT", "PLT" in the last 72 hours.  No results for input(s): "NA", "K", "CL", "CO2", "GLUCOSE", "BUN", "CREATININE", "CALCIUM " in the last 72 hours.   Intake/Output Summary (Last 24 hours) at 07/13/2023 1022 Last data filed at 07/13/2023 0755 Gross per 24 hour  Intake 1007 ml  Output --  Net 1007 ml        Physical Exam: Vital Signs Blood pressure (!) 142/53, pulse 79, temperature 98.3 F (36.8 C), resp. rate 16, height 5\' 4"  (1.626 m), weight 73.8 kg, SpO2 97%.  General: No acute distress Mood and affect are appropriate Heart: Regular rate and rhythm no rubs murmurs or extra sounds Lungs: Clear to auscultation, breathing unlabored, no rales or wheezes Abdomen: Positive bowel sounds, soft nontender to palpation, nondistended Extremities: No clubbing, cyanosis, or edema  Musculoskeletal:        General: Swelling and tenderness (left antecubital region) present.  Right knee swollen and tender    Cervical back: Normal range of motion.  Skin:    Comments: AVF site CDI. No erythema. Right heel boggy  Neurological:     Mental Status: She is alert.     Comments: Oriented to self only. Unable to recall age, DOB or place. Poor recall and decrease insight into deficits. She was able to follow simple motor commands. No CN deficits. Limited RUE d/t HD catheter. LUE limited d/t pain. Grip/wrist at least 3-4/5 bilaterally. RLE 3- to 3/5 HF, KE and 4/5 ADF/PF.LLE 4/5 throughout   No abnl resting tone. Psychiatric:     Comments: Pt flat, cooperative     Assessment/Plan: 1.  Functional deficits which require 3+ hours per day of interdisciplinary therapy in a comprehensive inpatient rehab setting. Physiatrist is providing close team supervision and 24 hour management of active medical problems listed below. Physiatrist and rehab team continue to assess barriers to discharge/monitor patient progress toward functional and medical goals  Care Tool:  Bathing    Body parts bathed by patient: Right arm, Left arm, Chest, Abdomen, Face   Body parts bathed by helper: Front perineal area, Buttocks, Right upper leg, Right lower leg, Left lower leg, Left upper leg     Bathing assist Assist Level: Maximal Assistance - Patient 24 - 49%     Upper Body Dressing/Undressing Upper body dressing   What is the patient wearing?: Pull over shirt, Bra    Upper body assist Assist Level: Maximal Assistance - Patient 25 - 49%    Lower Body Dressing/Undressing Lower body dressing      What is the patient wearing?: Incontinence brief, Pants     Lower body assist Assist for lower body dressing: Maximal Assistance - Patient 25 - 49%     Toileting Toileting    Toileting assist Assist for toileting: Moderate Assistance - Patient 50 - 74%     Transfers Chair/bed transfer  Transfers assist     Chair/bed transfer assist level: Minimal Assistance - Patient > 75%     Locomotion Ambulation   Ambulation assist      Assist level: 2 helpers Assistive device: Walker-rolling Max distance: 57ft   Walk 10 feet activity   Assist  Walk 10 feet activity did not occur: Safety/medical concerns (pain)  Assist level: 2 helpers Assistive device: Walker-rolling   Walk 50 feet activity   Assist Walk 50 feet with 2 turns activity did not occur: Safety/medical concerns (pain)         Walk 150 feet activity   Assist Walk 150 feet activity did not occur: Safety/medical concerns (pain)         Walk 10 feet on uneven surface  activity   Assist Walk 10 feet on  uneven surfaces activity did not occur: Safety/medical concerns (pain)         Wheelchair     Assist Is the patient using a wheelchair?: Yes Type of Wheelchair: Manual (lightweight)    Wheelchair assist level: Supervision/Verbal cueing Max wheelchair distance: 141ft    Wheelchair 50 feet with 2 turns activity    Assist        Assist Level: Supervision/Verbal cueing   Wheelchair 150 feet activity     Assist      Assist Level: Supervision/Verbal cueing   Blood pressure (!) 142/53, pulse 79, temperature 98.3 F (36.8 C), resp. rate 16, height 5\' 4"  (1.626 m), weight 73.8 kg, SpO2 97%.  Medical Problem List and Plan: 1. Functional deficits secondary to debility after acute respiratory failure and multiple medical issues             -patient may not yet shower             -ELOS/Goals: 10-14 days, supervision goals with PT, OT, SLP  2.  Antithrombotics: -DVT/anticoagulation:  Pharmaceutical: Eliquis  (for renal artery thrombosis)             -antiplatelet therapy: N/A 3. Acute on chronic LBP/Pain Management: Continue Hydrocodone  prn severe pain--monitor for sedation/confusion.              --Was on IV dilaudid  till 04/16-->now on hydrocodone  10 TID on average.              --increased use of Zanaflex  in past 48 hours.              --continue Gabapentin  300 mg/hs 4. Hx of depression/anxiety/Sleep: LCSW to follow for evaluation and support             --melatonin prn for insomnia.              -antipsychotic agents: N/A             -pt on chronic prozac  and remeron  5. Neuropsych/cognition: This patient is not capable of making decisions on her own behalf. 6. Skin/Wound Care: Routine pressure relief measures.              -float both heels 7. Fluids/Electrolytes/Nutrition: Strict I/O. Labs with HD             --1200 cc FR/day.  8. Cellulitis/Possible abscess LUE: On Van since 04/19 and Zosyn  since 04/17             --WBC trending down to 10.5 today. BC X 2 from  04/19 pending--negative so far.              --has been afebrile X 24 hours.  Zosyn   changed to Augmentin  by Dr. Sunnie England. End date for augmentin  is 4/29              -pt reports that arm is feeling less tender  9. H/o GIB: Monitor H/H as had further drop. Hgb down to             On IV iron /ESA  10. HTN: Monitor BP TID--on Torsemide  on non dialysis days.   11. 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 H/o Gastritis/esophagitis: Most recent admission last month. Continue PPI BID.  --Has required one unit PRBC 04/16.              --monitor for signs of bleeding.   13. Chronic diarrhea:  continue Colestid  and Creon  tid- changed to after meals as per daughter's request  14. H/o cognitive/memory deficits: continue Exelon  bid.    15. ESRD: Now HD dependent--TTS at the end of the day to help with tolerance of therapy             --continue renveal for metabolic bone disease.   Per Nephro  17. T2DM: Hgb A1c- 6.2 and diet controlled. Add CM restrictions.  -d/c ISS, d/c CBGs  18. Bilateral knee pain: voltaren  gel scheduled, right knee sleeve ordered, consulted ortho for right knee steroid injection, discussed that there is plan for this today  19. Bowel incontinence: lumbar MRI showed and discussed there is no evidence of cauda equina  20. New upper extremity weakness: cervical MRI ordered and discussed that there is moderate canal stenosis, discussed with daughter that oral steroids, steroid injection, surgery are possible interventions- deferred at this time given patient's risk factors  LOS: 5 days A FACE TO FACE EVALUATION WAS PERFORMED  Genetta Kenning 07/13/2023, 10:22 AM

## 2023-07-13 NOTE — Progress Notes (Signed)
 Cartridge Change. Unable to clear art alarm/ air in line alarm.

## 2023-07-13 NOTE — Progress Notes (Signed)
 Tx paused

## 2023-07-13 NOTE — Progress Notes (Signed)
Pre Hd 

## 2023-07-13 NOTE — Progress Notes (Signed)
 Tx resumed

## 2023-07-13 NOTE — Progress Notes (Signed)
 BP remains low. Target lowered to . Uf remains on.

## 2023-07-13 NOTE — Progress Notes (Signed)
 PROGRESS NOTE   Subjective/Complaints:  Pt doing well, slept well, pain well managed, LBM 2 days ago, urinating fine, denies any other complaints or concerns. No knee injection done yesterday.   ROS: +upper and lower extremity weakness, +bowel incontinence, +right knee pain-limits therapy. Denies CP, SOB, abd pain, n/v/d/c   Objective:   No results found.  No results for input(s): "WBC", "HGB", "HCT", "PLT" in the last 72 hours.  No results for input(s): "NA", "K", "CL", "CO2", "GLUCOSE", "BUN", "CREATININE", "CALCIUM " in the last 72 hours.   Intake/Output Summary (Last 24 hours) at 07/13/2023 1230 Last data filed at 07/13/2023 0755 Gross per 24 hour  Intake 1007 ml  Output --  Net 1007 ml        Physical Exam: Vital Signs Blood pressure (!) 142/53, pulse 79, temperature 98.3 F (36.8 C), resp. rate 16, height 5\' 4"  (1.626 m), weight 73.8 kg, SpO2 97%.  Gen: no distress, normal appearing, sitting in w/c at sink HEENT: oral mucosa pink and moist, NCAT Cardio: Reg rate, no m/r/g appreciated Chest: normal effort, normal rate of breathing, CTAB Abd: soft, non-distended, +BS throughout, nonTTP Ext: no edema peripherally Psychiatric:     Comments: Pt flat, cooperative   PRIOR EXAMS: Musculoskeletal:        General: Swelling and tenderness (left antecubital region) present.  Right knee swollen and tender    Cervical back: Normal range of motion.  Skin:    Comments: AVF site CDI. No erythema. Right heel boggy  Neurological:     Mental Status: She is alert.     Comments: Oriented to self only. Unable to recall age, DOB or place. Poor recall and decrease insight into deficits. She was able to follow simple motor commands. No CN deficits. Limited RUE d/t HD catheter. LUE limited d/t pain. Grip/wrist at least 3-4/5 bilaterally. BLE 3- to 3/5 HF, KE and 4/5 ADF/PF. Decreased LT from ankles down bilaterally. No abnl resting  tone. DTR's tr to 1+, stable 4/24     Assessment/Plan: 1. Functional deficits which require 3+ hours per day of interdisciplinary therapy in a comprehensive inpatient rehab setting. Physiatrist is providing close team supervision and 24 hour management of active medical problems listed below. Physiatrist and rehab team continue to assess barriers to discharge/monitor patient progress toward functional and medical goals  Care Tool:  Bathing    Body parts bathed by patient: Right arm, Left arm, Chest, Abdomen, Face   Body parts bathed by helper: Front perineal area, Buttocks, Right upper leg, Right lower leg, Left lower leg, Left upper leg     Bathing assist Assist Level: Maximal Assistance - Patient 24 - 49%     Upper Body Dressing/Undressing Upper body dressing   What is the patient wearing?: Pull over shirt, Bra    Upper body assist Assist Level: Maximal Assistance - Patient 25 - 49%    Lower Body Dressing/Undressing Lower body dressing      What is the patient wearing?: Incontinence brief, Pants     Lower body assist Assist for lower body dressing: Maximal Assistance - Patient 25 - 49%     Toileting Toileting    Toileting assist Assist  for toileting: Moderate Assistance - Patient 50 - 74%     Transfers Chair/bed transfer  Transfers assist     Chair/bed transfer assist level: Minimal Assistance - Patient > 75%     Locomotion Ambulation   Ambulation assist      Assist level: 2 helpers Assistive device: Walker-rolling Max distance: 40ft   Walk 10 feet activity   Assist  Walk 10 feet activity did not occur: Safety/medical concerns (pain)  Assist level: 2 helpers Assistive device: Walker-rolling   Walk 50 feet activity   Assist Walk 50 feet with 2 turns activity did not occur: Safety/medical concerns (pain)         Walk 150 feet activity   Assist Walk 150 feet activity did not occur: Safety/medical concerns (pain)         Walk 10  feet on uneven surface  activity   Assist Walk 10 feet on uneven surfaces activity did not occur: Safety/medical concerns (pain)         Wheelchair     Assist Is the patient using a wheelchair?: Yes Type of Wheelchair: Manual (lightweight)    Wheelchair assist level: Supervision/Verbal cueing Max wheelchair distance: 153ft    Wheelchair 50 feet with 2 turns activity    Assist        Assist Level: Supervision/Verbal cueing   Wheelchair 150 feet activity     Assist      Assist Level: Supervision/Verbal cueing   Blood pressure (!) 142/53, pulse 79, temperature 98.3 F (36.8 C), resp. rate 16, height 5\' 4"  (1.626 m), weight 73.8 kg, SpO2 97%.  Medical Problem List and Plan: 1. Functional deficits secondary to debility after acute respiratory failure and multiple medical issues             -patient may not yet shower             -ELOS/Goals: 10-14 days, supervision goals with PT, OT, SLP  Left message for daughter to update her  -continue CIR 2.  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 Acute on chronic LBP/Pain Management: Continue Hydrocodone  prn severe pain--monitor for sedation/confusion.              --Was on IV dilaudid  till 04/16-->now on hydrocodone  10 TID on average.              --increased use of Zanaflex  in past 48 hours.              --continue Gabapentin  300 mg/hs 4. Hx of depression/anxiety/Sleep: LCSW to follow for evaluation and support             --melatonin prn for insomnia.              -antipsychotic agents: N/A             -pt on chronic prozac  20mg  daily and remeron  7.5mg  nightly 5. Neuropsych/cognition: This patient is not capable of making decisions on her own behalf. 6. Skin/Wound Care: Routine pressure relief measures.              -float both heels 7. Fluids/Electrolytes/Nutrition: Strict I/O. Labs with HD              --1200 cc FR/day.  8. Cellulitis/Possible abscess LUE: On Van since 04/19 and Zosyn  since 04/17 --WBC trending down to 10.5 today. BC X 2 from 04/19 pending--negative so far.  --  has been afebrile X 24 hours.  Zosyn  changed to Augmentin  by Dr. Sunnie England. End date for augmentin  is 4/29              -pt reports that arm is feeling less tender  9. H/o GIB: Monitor H/H as had further drop. Hgb down to             On IV iron /ESA  -07/13/23 Hgb stable last labs 4/20, monitor labs  10. HTN: Monitor BP TID--on Torsemide  on non dialysis days.   -07/13/23 [name] good, monitor  Vitals:   07/11/23 1615 07/11/23 1630 07/11/23 1700 07/11/23 1730  BP: (S) (!) 98/52 126/63 109/86 (!) 124/98   07/11/23 1800 07/11/23 1830 07/11/23 1834 07/11/23 2015  BP: 126/62 109/63 (!) 145/65 115/60   07/12/23 0547 07/12/23 1445 07/12/23 1949 07/13/23 0445  BP: (!) 140/68 (!) 121/59 (!) 119/50 (!) 142/53     11. 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 H/o Gastritis/esophagitis: Most recent admission last month. Continue Protonix  40mg  BID.  --Has required one unit PRBC 04/16.              --monitor for signs of bleeding.   13. Chronic diarrhea:  continue Colestid  1g TID and Creon  36kU tid- changed to after meals as per daughter's request -07/13/23 no BM in 2 days, monitor today, if nothing by tomorrow may need to consider meds.   14. H/o cognitive/memory deficits: continue Exelon  1.5mg  bid.    15. ESRD: Now HD dependent--TTS at the end of the day to help with tolerance of therapy             --continue renveal for metabolic bone disease.  Per nephro  17. T2DM: Hgb A1c- 6.2 and diet controlled. Add CM restrictions.  -d/c ISS, d/c CBGs  18. Bilateral knee pain: voltaren  gel scheduled, right knee sleeve ordered, consulted ortho for right knee steroid injection, discussed that there is plan for this today-- 07/13/23 not done yesterday, hoping they'll come this weekend.  19. Bowel incontinence: lumbar MRI  showed and discussed there is no evidence of cauda equina  20. New upper extremity weakness: cervical MRI ordered and discussed that there is moderate canal stenosis, discussed with daughter that oral steroids, steroid injection, surgery are possible interventions- deferred at this time given patient's risk factors   LOS: 5 days A FACE TO FACE EVALUATION WAS PERFORMED  [address] 07/13/2023, 12:30 PM

## 2023-07-14 ENCOUNTER — Inpatient Hospital Stay (HOSPITAL_COMMUNITY)

## 2023-07-14 DIAGNOSIS — D72829 Elevated white blood cell count, unspecified: Secondary | ICD-10-CM

## 2023-07-14 DIAGNOSIS — K5901 Slow transit constipation: Secondary | ICD-10-CM

## 2023-07-14 LAB — CBC WITH DIFFERENTIAL/PLATELET
Abs Immature Granulocytes: 0.42 10*3/uL — ABNORMAL HIGH (ref 0.00–0.07)
Basophils Absolute: 0.1 10*3/uL (ref 0.0–0.1)
Basophils Relative: 0 %
Eosinophils Absolute: 0 10*3/uL (ref 0.0–0.5)
Eosinophils Relative: 0 %
HCT: 27 % — ABNORMAL LOW (ref 36.0–46.0)
Hemoglobin: 8.4 g/dL — ABNORMAL LOW (ref 12.0–15.0)
Immature Granulocytes: 2 %
Lymphocytes Relative: 5 %
Lymphs Abs: 1 10*3/uL (ref 0.7–4.0)
MCH: 28.7 pg (ref 26.0–34.0)
MCHC: 31.1 g/dL (ref 30.0–36.0)
MCV: 92.2 fL (ref 80.0–100.0)
Monocytes Absolute: 1.1 10*3/uL — ABNORMAL HIGH (ref 0.1–1.0)
Monocytes Relative: 6 %
Neutro Abs: 17.7 10*3/uL — ABNORMAL HIGH (ref 1.7–7.7)
Neutrophils Relative %: 87 %
Platelets: 465 10*3/uL — ABNORMAL HIGH (ref 150–400)
RBC: 2.93 MIL/uL — ABNORMAL LOW (ref 3.87–5.11)
RDW: 16.8 % — ABNORMAL HIGH (ref 11.5–15.5)
WBC: 20.4 10*3/uL — ABNORMAL HIGH (ref 4.0–10.5)
nRBC: 0 % (ref 0.0–0.2)

## 2023-07-14 LAB — GLUCOSE, CAPILLARY: Glucose-Capillary: 137 mg/dL — ABNORMAL HIGH (ref 70–99)

## 2023-07-14 MED ORDER — HEPARIN SODIUM (PORCINE) 1000 UNIT/ML IJ SOLN
INTRAMUSCULAR | Status: AC
  Filled 2023-07-14: qty 4

## 2023-07-14 MED ORDER — HEPARIN SODIUM (PORCINE) 1000 UNIT/ML IJ SOLN
4000.0000 [IU] | Freq: Once | INTRAMUSCULAR | Status: AC
  Administered 2023-07-14: 4000 [IU]

## 2023-07-14 MED ORDER — SODIUM CHLORIDE 0.9 % IV SOLN
2.0000 g | INTRAVENOUS | Status: DC
  Administered 2023-07-14 – 2023-07-22 (×8): 2 g via INTRAVENOUS
  Filled 2023-07-14 (×8): qty 20

## 2023-07-14 MED ORDER — VANCOMYCIN HCL 750 MG/150ML IV SOLN
750.0000 mg | INTRAVENOUS | Status: DC
  Administered 2023-07-16 – 2023-07-20 (×3): 750 mg via INTRAVENOUS
  Filled 2023-07-14 (×3): qty 150

## 2023-07-14 NOTE — Progress Notes (Signed)
 Plan was for Augmentin /vanc end on 4/29. Pt received vanc last PM after HD. This will last until 4/29 so we will dc the vanc order.  Ivery Marking, PharmD, BCIDP, AAHIVP, CPP Infectious Disease Pharmacist 07/14/2023 12:52 PM

## 2023-07-14 NOTE — Plan of Care (Signed)
  Problem: RH BOWEL ELIMINATION Goal: RH STG MANAGE BOWEL WITH ASSISTANCE Description: STG Manage Bowel with mod I Assistance. Outcome: Progressing   Problem: RH SAFETY Goal: RH STG ADHERE TO SAFETY PRECAUTIONS W/ASSISTANCE/DEVICE Description: STG Adhere to Safety Precautions With cues Assistance/Device. Outcome: Progressing   Problem: RH PAIN MANAGEMENT Goal: RH STG PAIN MANAGED AT OR BELOW PT'S PAIN GOAL Description: < 4 with prns Outcome: Progressing   Problem: Fluid Volume: Goal: Compliance with measures to maintain balanced fluid volume will improve Description: Patient and family will be able to manage care at discharge using educational resources for 1200 cc fluid restriction/ dietary modification recommendations independently Outcome: Progressing

## 2023-07-14 NOTE — Progress Notes (Signed)
 Physical Therapy Session Note  Patient Details  Name: [name] MRN: [id number] Date of Birth: [birth year]  Today's Date: 07/14/2023 PT Individual Time: 1115-1150 and 1446-1521 PT Individual Time Calculation (min): 35 min and 35 min.  Short Term Goals: Week 1:  PT Short Term Goal 1 (Week 1): pt will transfer bed<>chair with LRAD and CGA PT Short Term Goal 2 (Week 1): pt will transfer sit<>stand with LRAD and CGA PT Short Term Goal 3 (Week 1): pt will ambulate 50ft with LRAD and CGA  Skilled Therapeutic Interventions/Progress Updates:   First session:  Pt presents semi-reclined in bed and reluctantly agreeable to therapy.  Pt c/o pain to R knee and does not like to use meds, dtr present.  Encouraged to utilize pain meds for increased mobility.  PT donned R knee sleeve w/ total A and then mod A for RLE to transfer to EOB.  Pt w/ slow movements 2/2 pain.  Pt transferred from elevated bed height and mod A to RW.  Pt performed step-pivot transfer bed > w/c w/ mod A and step by step cues for posture, walker management and weight shift to UES to assist w/ R knee pain.  Pt waiting for R knee injection.  Pt remained sitting in w/c w/ chair alarm on and all needs in reach, dtr present.  Nursing in at completion for pain meds distribution.  Missed time of 10' 2/2 increased pain.  Second session:  Pt presents sitting in w/c w/ student nurses finishing care.  Pt agreeable to therapy, but c/o pain to R knee of 8/10.  Pt wheeled to dayroom and attempted sit to stand.  Pt unable to achieve upright stance 2/2 pain.  Pt performed seated cornhole toss w/ R hand and then L hand crossing midline.  Pt required to return to room for IV team to restart IV.  Missed time of 10' therapy.     Therapy Documentation Precautions:  Precautions Precautions: Fall Precaution/Restrictions Comments: dementia Restrictions Weight Bearing Restrictions Per Provider Order: No General: PT Amount of Missed Time (min): 10  Minutes PT Missed Treatment Reason: Pain Vital Signs:   Pain:9/10, pain meds given at completion of therapy session. 8/10 in PM, modified rx to accommodate. Pain Assessment Pain Scale: 0-10 Pain Score: Asleep Pain Location: Knee Pain Intervention(s): Medication (See eMAR)    Therapy/Group: Individual Therapy  Jacobb Alen P Jovontae Banko 07/14/2023, 12:22 PM

## 2023-07-14 NOTE — Progress Notes (Signed)
 Occupational Therapy Session Note  Patient Details  Name: [name] MRN: [id number] Date of Birth: [birth year]  Today's Date: 07/14/2023 OT Individual Time: 0900-1000 OT Individual Time Calculation (min): 60 min    Short Term Goals: Week 1:  OT Short Term Goal 1 (Week 1): Pt will complete sit > stand in prep for ADL with CGA using LRAD OT Short Term Goal 2 (Week 1): Pt will complete toilet transfer with CGA using LRAD OT Short Term Goal 3 (Week 1): Pt will initiate toileting need with min questioning cues OT Short Term Goal 4 (Week 1): Pt will complete 1/3 toileting steps with min A for balance  Skilled Therapeutic Interventions/Progress Updates:  Patient resting in bed with family present at the time of treatment. Patient indicated that she rested ok, however she presented with a pain response of 5 on 0-10 for leg pain.  The pt was in agreement with completing BADL related task in bathing EOB. The pt was able to bathe UB with s/u A . The pt was able to follow simple command throughout task performance. The pt was ModA for LB task performance inclusive of  the upper portion of BLE and her perineal region, The pt was Dep for her feet and bottom.  The pt was able to donn her bra and over head shirt with ModA, she was Max A for her brief, pants and socks. The pt was able to come from sit to stand using the RW with Mod/MaxA while managing LB garments.  The pt was able to transfer back to bed LOF with ModA for managing BLE onto the bed.  At the end of the session, the call light and bedside table were placed within reach and all additional needs were addressed.    Therapy Documentation Precautions:  Precautions Precautions: Fall Precaution/Restrictions Comments: dementia Restrictions Weight Bearing Restrictions Per Provider Order: No   Therapy/Group: Individual Therapy  Moises Ang 07/14/2023, 4:06 PM

## 2023-07-14 NOTE — Progress Notes (Signed)
 Post HD. Vitals stable. Patient alert and oriented.

## 2023-07-14 NOTE — Progress Notes (Signed)
 PROGRESS NOTE   Subjective/Complaints:  Pt doing ok, slept well, pain doing ok-- meds help, but states R knee is bothersome, wanting to get injection done soon. LBM 2-3 days ago but not too concerned with it. Urinating fine, no pain with urination. No coughing. Daughter concerned with elevated WBC, agreeable with plan for work up, but wants to know if knee aspiration and culture could be done, wants to r/o septic joint. Feels the arm infection looks clinically better.  Denies any other complaints or concerns.  ROS: +upper and lower extremity weakness, +bowel incontinence, +right knee pain-limits therapy. Denies CP, SOB, abd pain, n/v/d/c   Objective:   No results found.  Recent Labs    07/13/23 2115 07/14/23 1057  WBC 19.7* 20.4*  HGB 7.9* 8.4*  HCT 25.2* 27.0*  PLT 476* 465*    Recent Labs    07/13/23 2115  NA 133*  K 3.7  CL 93*  CO2 20*  GLUCOSE 238*  BUN 95*  CREATININE 8.20*  CALCIUM  9.5     Intake/Output Summary (Last 24 hours) at 07/14/2023 1313 Last data filed at 07/14/2023 1149 Gross per 24 hour  Intake 794 ml  Output 1.1 ml  Net 792.9 ml        Physical Exam: Vital Signs Blood pressure (!) 95/44, pulse 86, temperature 99.5 F (37.5 C), temperature source Oral, resp. rate 17, height 5\' 4"  (1.626 m), weight 70.1 kg, SpO2 96%.  Gen: no distress, normal appearing, resting in bed.  HEENT: oral mucosa pink and moist, NCAT Cardio: Reg rate, no m/r/g appreciated Chest: normal effort, normal rate of breathing, CTAB Abd: soft, non-distended, +BS throughout, nonTTP Ext/MsK: no edema peripherally, LUE with graft site at L elbow with very faint erythema and slight warmth noted, no red streaking. Seems better than previously described. R knee tender, with effusion, but no erythema or significant warmth.  Psychiatric:     Comments: Pt flat, cooperative   PRIOR EXAMS: Musculoskeletal:        General:  Swelling and tenderness (left antecubital region) present.  Right knee swollen and tender    Cervical back: Normal range of motion.  Skin:    Comments: AVF site CDI. No erythema. Right heel boggy  Neurological:     Mental Status: She is alert.     Comments: Oriented to self only. Unable to recall age, DOB or place. Poor recall and decrease insight into deficits. She was able to follow simple motor commands. No CN deficits. Limited RUE d/t HD catheter. LUE limited d/t pain. Grip/wrist at least 3-4/5 bilaterally. BLE 3- to 3/5 HF, KE and 4/5 ADF/PF. Decreased LT from ankles down bilaterally. No abnl resting tone. DTR's tr to 1+, stable 4/24     Assessment/Plan: 1. Functional deficits which require 3+ hours per day of interdisciplinary therapy in a comprehensive inpatient rehab setting. Physiatrist is providing close team supervision and 24 hour management of active medical problems listed below. Physiatrist and rehab team continue to assess barriers to discharge/monitor patient progress toward functional and medical goals  Care Tool:  Bathing    Body parts bathed by patient: Right arm, Left arm, Chest, Abdomen, Face   Body  parts bathed by helper: Front perineal area, Buttocks, Right upper leg, Right lower leg, Left lower leg, Left upper leg     Bathing assist Assist Level: Maximal Assistance - Patient 24 - 49%     Upper Body Dressing/Undressing Upper body dressing   What is the patient wearing?: Pull over shirt, Bra    Upper body assist Assist Level: Maximal Assistance - Patient 25 - 49%    Lower Body Dressing/Undressing Lower body dressing      What is the patient wearing?: Incontinence brief, Pants     Lower body assist Assist for lower body dressing: Maximal Assistance - Patient 25 - 49%     Toileting Toileting    Toileting assist Assist for toileting: Moderate Assistance - Patient 50 - 74%     Transfers Chair/bed transfer  Transfers assist     Chair/bed  transfer assist level: Moderate Assistance - Patient 50 - 74%     Locomotion Ambulation   Ambulation assist      Assist level: 2 helpers Assistive device: Walker-rolling Max distance: 31ft   Walk 10 feet activity   Assist  Walk 10 feet activity did not occur: Safety/medical concerns (pain)  Assist level: 2 helpers Assistive device: Walker-rolling   Walk 50 feet activity   Assist Walk 50 feet with 2 turns activity did not occur: Safety/medical concerns (pain)         Walk 150 feet activity   Assist Walk 150 feet activity did not occur: Safety/medical concerns (pain)         Walk 10 feet on uneven surface  activity   Assist Walk 10 feet on uneven surfaces activity did not occur: Safety/medical concerns (pain)         Wheelchair     Assist Is the patient using a wheelchair?: Yes Type of Wheelchair: Manual (lightweight)    Wheelchair assist level: Supervision/Verbal cueing Max wheelchair distance: 150ft    Wheelchair 50 feet with 2 turns activity    Assist        Assist Level: Supervision/Verbal cueing   Wheelchair 150 feet activity     Assist      Assist Level: Supervision/Verbal cueing   Blood pressure (!) 95/44, pulse 86, temperature 99.5 F (37.5 C), temperature source Oral, resp. rate 17, height 5\' 4"  (1.626 m), weight 70.1 kg, SpO2 96%.  Medical Problem List and Plan: 1. Functional deficits secondary to debility after acute respiratory failure and multiple medical issues             -patient may not yet shower             -ELOS/Goals: 10-14 days, supervision goals with PT, OT, SLP  Left message for daughter to update her  -continue CIR 2.  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 Acute on chronic LBP/Pain Management: Continue Hydrocodone  prn severe pain--monitor for sedation/confusion.              --Was on IV  dilaudid  till 04/16-->now on hydrocodone  10 TID on average.              --increased use of Zanaflex  in past 48 hours.              --continue Gabapentin  300 mg/hs 4. Hx of depression/anxiety/Sleep: LCSW to follow for evaluation and support             --  melatonin prn for insomnia.              -antipsychotic agents: N/A             -pt on chronic prozac  20mg  daily and remeron  7.5mg  nightly 5. Neuropsych/cognition: This patient is not capable of making decisions on her own behalf. 6. Skin/Wound Care: Routine pressure relief measures.              -float both heels 7. Fluids/Electrolytes/Nutrition: Strict I/O. Labs with HD             --1200 cc FR/day.  8. Cellulitis/Possible abscess LUE: On Van since 04/19 and Zosyn  since 04/17 --WBC trending down to 10.5 today. BC X 2 from 04/19 pending--negative so far.  --has been afebrile X 24 hours.  Zosyn  changed to Augmentin  by Dr. Sunnie England. End date for Vanc/augmentin  is 4/29              -pt reports that arm is feeling less tender -07/14/23 no fevers but now worsening leukocytosis, WBC 19.7 yesterday and 20.4 on repeat today, LUE not really that concerning looking -concerned for bacteremia, will repeat BCx (prior ones were NGTD x5d) -check CXR, U/A -when knee aspiration/injection is done, would recommend sending Cx -consulted pharmacy to see about abx choice/dosing particularly of the augmentin -- Ivery Marking said to switch augmentin  to ceftriaxone  and leave vanc for now -recheck CBC tomorrow morning  9. H/o GIB: Monitor H/H as had further drop. Hgb down to             On IV iron /ESA  -07/13/23 Hgb stable last labs 4/20, monitor labs  -07/14/23 Hgb 8.4, stable/improving; monitor  10. HTN: Monitor BP TID--on Torsemide  on non dialysis days.   -4/26-27/25 [name] good, monitor  Vitals:   07/13/23 2330 07/13/23 2345 07/13/23 2357 07/14/23 0000  BP: 98/70 (!) 132/53 (!) 145/114 (!) 145/114   07/14/23 0030 07/14/23 0045 07/14/23 0100 07/14/23 0104  BP:  128/71 (!) 132/59 (!) 81/45 110/78   07/14/23 0109 07/14/23 0146 07/14/23 0437 07/14/23 1300  BP: (!) 123/54 (!) 126/45 (!) 114/46 (!) 95/44     11. 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 H/o Gastritis/esophagitis: Most recent admission last month. Continue Protonix  40mg  BID.  --Has required one unit PRBC 04/16.              --monitor for signs of bleeding.   13. Chronic diarrhea:  continue Colestid  1g TID and Creon  36kU tid- changed to after meals as per daughter's request -07/13/23 no BM in 2 days, monitor today, if nothing by tomorrow may need to consider meds.  -07/14/23 no BM still but pt not that concerned; if she hasn't had a BM today, then tomorrow would consider some colace  14. H/o cognitive/memory deficits: continue Exelon  1.5mg  bid.    15. ESRD: Now HD dependent--TTS at the end of the day to help with tolerance of therapy             --continue renveal for metabolic bone disease.  Per nephro  17. T2DM: Hgb A1c- 6.2 and diet controlled. Add CM restrictions.  -d/c ISS, d/c CBGs  18. Bilateral knee pain: voltaren  gel scheduled, right knee sleeve ordered, consulted ortho for right knee steroid injection, discussed that there is plan for this today-- 07/13/23 not done yesterday, hoping they'll come this weekend. -07/14/23 injections not yet done, advised she'll likely have it done by Tawanna Faster PA-C tomorrow-- would reach out to him  if they haven't been done; of note, please consider sending aspirate for Cx given worsening leukocytosis  19. Bowel incontinence: lumbar MRI showed and discussed there is no evidence of cauda equina  20. New upper extremity weakness: cervical MRI ordered and discussed that there is moderate canal stenosis, discussed with daughter that oral steroids, steroid injection, surgery are possible interventions- deferred at this time given patient's risk factors  I spent >7mins performing patient care related activities, including  prolonged face to face times x2, documentation time, ordering of labs/imaging, med management/pharm consult, discussion of plan with patient and family, and overall coordination of care.   LOS: 6 days A FACE TO FACE EVALUATION WAS PERFORMED  [address] 07/14/2023, 1:13 PM

## 2023-07-14 NOTE — Progress Notes (Signed)
 Pt hypotensive, 100ml ns bolus. Patient was jerking and slow to respond. Yawning. Pt has 15 minutes left on treatment. Blood returned.

## 2023-07-14 NOTE — Progress Notes (Signed)
 Occupational Therapy Session Note  Patient Details  Name: [name] MRN: [id number] Date of Birth: Mar 05, 1946  Today's Date: 07/14/2023 OT Individual Time: 1300-1345 OT Individual Time Calculation (min): 45 min    Short Term Goals: Week 1:  OT Short Term Goal 1 (Week 1): Pt will complete sit > stand in prep for ADL with CGA using LRAD OT Short Term Goal 2 (Week 1): Pt will complete toilet transfer with CGA using LRAD OT Short Term Goal 3 (Week 1): Pt will initiate toileting need with min questioning cues OT Short Term Goal 4 (Week 1): Pt will complete 1/3 toileting steps with min A for balance  Skilled Therapeutic Interventions/Progress Updates:      Therapy Documentation Precautions:  Precautions Precautions: Fall Precaution/Restrictions Comments: dementia Restrictions Weight Bearing Restrictions Per Provider Order: No General: "Thank you" Pt seated in W/C upon OT arrival, agreeable to OT. Daughter present for session.  Pain: unrated pain reported in Rt knee, positioning, activity, intermittent rest breaks, distractions provided for pain management, pt reports tolerable to proceed.   Exercises: OT providing skilled intervention for increased strength/ROM/endurance of BUE d/t decreased grip and ADL participation. Pt recieved foam block hand home exercise program in order to increase strength/ROM of bilateral hands.  Pt educated on purpose of exercises and reps/sets to complete. Pt given pink foam block and instructed on the following exercises:  -power grip -key pinch -pincer grasp -three finger pinch -thumb flexion -PIP grip  OT also issuing tan theraputty with small beads throughout in order to increase intrinsic hand musculature and activity tolerance for ADLs such as pants management.    Other Treatments: OT providing positioning in W/C and heat modalities for increased Rt knee pain. Daughter reported pt was supposed to receive steroid injection and still waiting  on procedure. OT provided slight elevation in knee with pillows. Pt may benefit from ELR in specialty W/C consult for increased pain management.   Pt seated in W/C at end of session with W/C alarm donned, call light within reach and 4Ps assessed. Daughter present.    Therapy/Group: Individual Therapy  Nila Barth, OTD, OTR/L 07/14/2023, 4:08 PM

## 2023-07-14 NOTE — Progress Notes (Signed)
 PHARMACY ANTIBIOTIC CONSULT NOTE   [name] a 78 y.o. female presented w/ HF exacerbation. Patient briefly received a couple of days of IV abx for LUE cellulitis in setting of new AVF creation- abx were stopped 4/17.   Pt has been on some kind of abx since 4/8. The course was to be ended on 4/29. She has remained afebrile but wbc up to 20k today. Cultures have remained neg. PA is concern about possible infection. Vanc to be continued and augmentin  changed to ceftriaxone  pending more cultures.   Plan: Continue vanc 750mg  IV TTS Ceftriaxone  2g IV q24 Random vanc in AM   Allergies:  Allergies  Allergen Reactions   Anesthetics, Halogenated Other (See Comments)    Per patient's daughter, she can not tolerate anesthetics.    Etomidate  Other (See Comments)    Heart stopped during anesthesia   Fentanyl  Other (See Comments)    Heart stopped during anesthesia    Percocet [Oxycodone -Acetaminophen ] Other (See Comments)    Headaches    Diazepam Other (See Comments)    Agitation   Lorazepam  Nausea Only   Tramadol  Hcl Nausea And Vomiting   Haloperidol Nausea And Vomiting    **Haldol**   Morphine  Sulfate Nausea Only    agitation   Propoxyphene Nausea And Vomiting    **Darvocet** no longer available   Versed  [Midazolam ] Other (See Comments)    Heart stopped during anesthesia    Filed Weights   07/12/23 0616 07/13/23 0500 07/14/23 0633  Weight: 72.5 kg (159 lb 13.3 oz) 73.8 kg (162 lb 11.2 oz) 70.1 kg (154 lb 8.7 oz)       Latest Ref Rng & Units 07/14/2023   10:57 AM 07/13/2023    9:15 PM 07/10/2023    5:45 AM  CBC  WBC 4.0 - 10.5 K/uL 20.4  19.7  11.9   Hemoglobin 12.0 - 15.0 g/dL 8.4  7.9  8.2   Hematocrit 36.0 - 46.0 % 27.0  25.2  26.1   Platelets 150 - 400 K/uL 465  476  357     Antibiotics Given (last 72 hours)     Date/Time Action Medication Dose Rate   07/11/23 1704 New Bag/Given   vancomycin  (VANCOREADY) IVPB 750 mg/150 mL 750 mg 150 mL/hr   07/11/23 2016  Given   amoxicillin -clavulanate (AUGMENTIN ) 500-125 MG per tablet 1 tablet 1 tablet    07/14/23 0008 New Bag/Given  [In HD]   vancomycin  (VANCOREADY) IVPB 750 mg/150 mL 750 mg 150 mL/hr   07/14/23 0205 Given   amoxicillin -clavulanate (AUGMENTIN ) 500-125 MG per tablet 1 tablet 1 tablet       Vanc 4/15 >>4/17; 4/19>> Ceftriaxone  4/27>> Augmentin  4/21>>4/27 Zosyn  4/19>>4/21 Zosyn  4/15 >>4/17  4/8 - Blood - negF 4/15 - Bld - ngF 4/15 MRSA PCR - negative 4/19 Bcx- ngF 4/27 blood>>   Ivery Marking, PharmD, [place], AAHIVP, CPP Infectious Disease Pharmacist 07/14/2023 1:46 PM

## 2023-07-15 LAB — CBC WITH DIFFERENTIAL/PLATELET
Abs Immature Granulocytes: 0.28 10*3/uL — ABNORMAL HIGH (ref 0.00–0.07)
Basophils Absolute: 0 10*3/uL (ref 0.0–0.1)
Basophils Relative: 0 %
Eosinophils Absolute: 0.1 10*3/uL (ref 0.0–0.5)
Eosinophils Relative: 1 %
HCT: 25.3 % — ABNORMAL LOW (ref 36.0–46.0)
Hemoglobin: 7.8 g/dL — ABNORMAL LOW (ref 12.0–15.0)
Immature Granulocytes: 2 %
Lymphocytes Relative: 6 %
Lymphs Abs: 1 10*3/uL (ref 0.7–4.0)
MCH: 28.7 pg (ref 26.0–34.0)
MCHC: 30.8 g/dL (ref 30.0–36.0)
MCV: 93 fL (ref 80.0–100.0)
Monocytes Absolute: 0.9 10*3/uL (ref 0.1–1.0)
Monocytes Relative: 5 %
Neutro Abs: 13.7 10*3/uL — ABNORMAL HIGH (ref 1.7–7.7)
Neutrophils Relative %: 86 %
Platelets: 451 10*3/uL — ABNORMAL HIGH (ref 150–400)
RBC: 2.72 MIL/uL — ABNORMAL LOW (ref 3.87–5.11)
RDW: 16.9 % — ABNORMAL HIGH (ref 11.5–15.5)
WBC: 15.9 10*3/uL — ABNORMAL HIGH (ref 4.0–10.5)
nRBC: 0 % (ref 0.0–0.2)

## 2023-07-15 LAB — SYNOVIAL CELL COUNT + DIFF, W/ CRYSTALS
Eosinophils-Synovial: 0 % (ref 0–1)
Eosinophils-Synovial: 0 % (ref 0–1)
Lymphocytes-Synovial Fld: 1 % (ref 0–20)
Lymphocytes-Synovial Fld: 1 % (ref 0–20)
Monocyte-Macrophage-Synovial Fluid: 4 % — ABNORMAL LOW (ref 50–90)
Monocyte-Macrophage-Synovial Fluid: 7 % — ABNORMAL LOW (ref 50–90)
Neutrophil, Synovial: 95 % — ABNORMAL HIGH (ref 0–25)
Neutrophil, Synovial: 92 % — ABNORMAL HIGH (ref 0–25)
WBC, Synovial: 30000 /mm3 — ABNORMAL HIGH (ref 0–200)
WBC, Synovial: 89750 /mm3 — ABNORMAL HIGH (ref 0–200)

## 2023-07-15 LAB — URINALYSIS, W/ REFLEX TO CULTURE (INFECTION SUSPECTED)
Bilirubin Urine: NEGATIVE
Glucose, UA: NEGATIVE mg/dL
Hgb urine dipstick: NEGATIVE
Ketones, ur: NEGATIVE mg/dL
Nitrite: NEGATIVE
Protein, ur: >=300 mg/dL — AB
Specific Gravity, Urine: 1.018 (ref 1.005–1.030)
pH: 5 (ref 5.0–8.0)

## 2023-07-15 LAB — VANCOMYCIN, RANDOM: Vancomycin Rm: 26 ug/mL

## 2023-07-15 LAB — HEPATITIS B SURFACE ANTIGEN: Hepatitis B Surface Ag: NONREACTIVE

## 2023-07-15 MED ORDER — OXYCODONE-ACETAMINOPHEN 5-325 MG PO TABS
1.0000 | ORAL_TABLET | Freq: Once | ORAL | Status: AC
  Administered 2023-07-15: 1 via ORAL
  Filled 2023-07-15: qty 1

## 2023-07-15 MED ORDER — BUPIVACAINE HCL (PF) 0.5 % IJ SOLN
10.0000 mL | Freq: Once | INTRAMUSCULAR | Status: DC
  Filled 2023-07-15: qty 10

## 2023-07-15 MED ORDER — VANCOMYCIN VARIABLE DOSE PER UNSTABLE RENAL FUNCTION (PHARMACIST DOSING): Status: DC

## 2023-07-15 NOTE — Progress Notes (Signed)
 Physical Therapy Session Note  Patient Details  Name: Tailor [place] Torrence MRN: [id number] Date of Birth: 1945-07-14  Today's Date: 07/15/2023 PT Individual Time: 0951-1015 PT Individual Time Calculation (min): 24 min   Short Term Goals: Week 1:  PT Short Term Goal 1 (Week 1): pt will transfer bed<>chair with LRAD and CGA PT Short Term Goal 2 (Week 1): pt will transfer sit<>stand with LRAD and CGA PT Short Term Goal 3 (Week 1): pt will ambulate 67ft with LRAD and CGA  Skilled Therapeutic Interventions/Progress Updates:      Pt presents in bed and in agreement to therapy treatment. Reports unrated R knee pain - mobility and repositioning provided for pain management. Donned her R knee neoprene sleeve with totalA at bed level.   Supine to sitting EOB with minA for trunk support and "walking" legs off the EOB. Patient with painful R knee when bending to achieve EOB sitting. ModA for stand pivot transfer into wheelchair and then transported to day room gym for energy conservation.  In day room gym worked on standing tolerance/balance to help with caregiver burden and improve functional independence. Completed 2x2 minute static standing with counter support and minA for stability/balance. Extended rest break b/w sets due to fatigue. Cues during standing for upright posture, hip extension, knee extension, and glut activation.   Returned to her room at end of session and she was left sitting upright in w/c with needs met, aware of upcoming OT session.   Therapy Documentation Precautions:  Precautions Precautions: Fall Precaution/Restrictions Comments: dementia Restrictions Weight Bearing Restrictions Per Provider Order: No General:   Therapy/Group: Individual Therapy  Pheobe Brass 07/15/2023, 7:56 AM

## 2023-07-15 NOTE — Progress Notes (Signed)
 Patient ID: [name], female   DOB: [birth year], 78 y.o.   MRN: [id number]  Informed by nursing and Dr. Alessandra Ancona that procedural knee was supposed to be the right. In reviewing event she was indeed supposed to have right knee arthrocentesis. Even though I consented for same, the pt told me that her left knee was the problem, appeared to have an effusion, and was tender on exam and painful with ROM. I therefore aspirated and injected that one in error. Once informed I spoke to pt, granddaughter, and daughter. Offered to have someone else perform procedure on correct knee but they were ok with me doing it as it would likely be more timely. Right knee aspirated and injected with nearly identical results. Given results of first aspiration suspect she has extraarticular gout. Will await cell count from right knee though. Attending and supervising MD's aware.    Georganna Kin, PA-C Orthopedic Surgery [phone]

## 2023-07-15 NOTE — Progress Notes (Signed)
 Pharmacy Antibiotic Note  [name] a 78 y.o. female presented w/ HF exacerbation. Patient briefly received a few days of IV antibiotics 4/8>>4/10 for LUE cellulitis in setting of new AVF creation then changed to PO on 4/11. Back to IV antibiotics on 4/15 and course was to end on 4/29. She had remained afebrile but fever and increased WBC on 4/28 and concern for possible infection. Cultures have remained negative. Vanc to continue and Augmentin  changed to ceftriaxone  pending further cultures. L knee aspirated today, 0.5 ml yellow fluid obtained, some turbidity, 30K WBCs per cell count.  Random Vanc level today is 26 mcg/ml. Last Vanc dose 4/27.  Last HD 4/26. Estimated Vanc level will drop to ~23 mcg/ml by 4/29 pre-HD.  Plan: Continue Vancomycin  750 mg IV after HD TTS. Ceftriaxone  2gm IV q24hr. Follow HD schedule for any need to re-time doses. Pre-HD Vanc levels 15-25 mcg/ml. Follow up culture data, clinical progress and antibiotic plans.  Height: 5\' 4"  (162.6 cm) Weight: 69.9 kg (154 lb 1.6 oz) IBW/kg (Calculated) : 54.7  Temp (24hrs), Avg:98.3 F (36.8 C), Min:98.1 F (36.7 C), Max:98.7 F (37.1 C)  Recent Labs  Lab 07/09/23 0505 07/10/23 0545 07/13/23 2115 07/14/23 1057 07/15/23 0849  WBC 11.9* 11.9* 19.7* 20.4* 15.9*  CREATININE 4.66*  --  8.20*  --   --   VANCORANDOM  --   --   --   --  26    ESRD  Antimicrobials this admission: Vanc 4/8>>4/10; 4/15 >>4/17; 4/19>> Ceftriaxone  4/8>>4/10;4/27>> Cephalexin  4/11>>4/15 Zosyn  4/15 >>4/17; 4/19>>4/21 Augmentin  4/21>>4/27  Dose adjustments this admission: 4/28 VR: 26 mcg/ml - for next HD on 4/29, estimate level will be ~23 mcg/ml - goal for re-dosing is 15-25 mcg/ml pre-HD  Microbiology results: 4/08 blood: negative 4/15 blood: negative 4/15 MRSA PCR: negative 4/19 blood: negative 4/27 blood: no growth < 24 hrs to date  Thank you for allowing pharmacy to be a part of this patient's care.  Adolphus Akin, RPh 07/15/2023 2:21 PM

## 2023-07-15 NOTE — Consult Note (Signed)
 Reason for Consult:Left knee pain Referring Physician: Laverle Postin Time called: 1130 Time at bedside: 1137   [name] is an 78 y.o. female.  HPI: [name] is in CIR 2/2 debility from numerous issues. She has been c/o left knee pain and orthopedic surgery was consulted for injection. In the intervening time between consult and pt readiness for injection she developed a white count and focus was changed to r/o septic arthritis.  Past Medical History:  Diagnosis Date   Abdominal discomfort    Chronic N/V/D. Presumptive dx Crohn's dx per elevated p ANCA. Failed Entocort and Pentasa. Sep 2003 - ileocolectomy c anastomosis per Dr Duwaine Gins 2/2 adhesions - path was hegative for Crohns. EGD, Sm bowel follow through (11/03), and an eteroclysis (10/03) were unrevealing. Cuases hypomag and hypocalcemia.   Adnexal mass 10/2001   s/p lap BSO (R ovarian fibroma) & lysis of adhesions   Allergy    Seasonal   Anemia    Multifactorial. Baseline HgB 10-11 ish. B12 def - 150 in 3/10. Fe Def - ferritin 35 3/10. Both are being repleted.   Breast cancer (HCC) 03/16/2013   right, 5 o'clock   CAD (coronary artery disease) 1996   1996 - PTCA and angioplasty diagonal branch. 2000 - Rotoblator & angiopllasty of diagonal. 2006 - subendocardial AMI, DES to proximal LAD.Aaron Aas Also had 90% stenosis in distal apical LAD. EF 55 with apical hypokinesis. Indefinite ASA and Plavix .   CHF (congestive heart failure) (HCC)    Chronic kidney disease, stage 4 (severe) (HCC)    Chronic pain    CT 10/10 = Spinal stenosis L2 - S1.   COVID-19 11/02/2020   Diabetes mellitus    Insulin  dependent   Diabetes mellitus type 2 in obese 05/03/2010   Managed on lantus  and novolog . Has diabetic nephropathy. Metformin D/C'd 2012 2/2 creatinine.  No diabetic retinopathy per 3/11.    Gout    Hx of radiation therapy 06/02/13- 07/16/13   right rbeast 4500 cGy 25 sessions, right breast boost 1600 cGy in 8 sessions   Hyperlipidemia    Managed  with both a statin and Welchol . Welchol  stopped 2014 2/2 cost and started on fenofibrate     Hypertension    2006 B renal arteries patent. 2003 MRA - no RAS. 2003 pheo W/U Dr Francetta Innocent reportedly negative.   Hypoxia 07/23/2017   Lupus    Lymphedema of breast    Personal history of radiation therapy 2015   Pneumonia of left lung due to infectious organism    RBBB    Renal vein thrombosis (HCC)    SBO (small bowel obstruction) (HCC) 09/17/2017   Secondary hyperparathyroidism (HCC)    Stroke [facility])    Incidental finding MRI 2002 L lacunar infarct   Vitamin B12 deficiency    Vitamin D  deficiency    Wears dentures    top    Past Surgical History:  Procedure Laterality Date   ABDOMINAL HYSTERECTOMY     ARTERY REPAIR Left 07/01/2023   Procedure: REPAIR, ARTERY, BRACHIAL USING VEIN PATCH;  Surgeon: Young Hensen, MD;  Location: [facility] OR;  Service: Vascular;  Laterality: Left;   AVGG REMOVAL Left 07/01/2023   Procedure: REMOVAL OF ARTERIOVENOUS GORETEX GRAFT (AVGG);  Surgeon: Young Hensen, MD;  Location: [facility] OR;  Service: Vascular;  Laterality: Left;  LEFT ARM   BILATERAL SALPINGOOPHORECTOMY  8/03   Lap BSO (R ovarian fibroma) and adhesion lysis   BIOPSY  09/08/2020   Procedure: BIOPSY;  Surgeon: Lanita Pitman,  MD;  Location: [facility] ENDOSCOPY;  Service: Endoscopy;;   BOWEL RESECTION  2003   ileocolectomy with anastomosis 2/2 adhesions   BREAST BIOPSY Right 2015   BREAST BIOPSY Right 07/2014   BREAST LUMPECTOMY Right 04/22/2013   BREAST LUMPECTOMY WITH NEEDLE LOCALIZATION AND AXILLARY SENTINEL LYMPH NODE BX Right 04/22/2013   Procedure: BREAST LUMPECTOMY WITH NEEDLE LOCALIZATION AND AXILLARY SENTINEL LYMPH NODE BX;  Surgeon: Lockie Rima, MD;  Location: [facility];  Service: General;  Laterality: Right;   CARDIAC CATHETERIZATION     2 stents   CHOLECYSTECTOMY     COLONOSCOPY     COLONOSCOPY N/A 05/30/2023   Procedure: COLONOSCOPY;  Surgeon: Felecia Hopper, MD;  Location:  [facility] ENDOSCOPY;  Service: Gastroenterology;  Laterality: N/A;   ESOPHAGOGASTRODUODENOSCOPY N/A 09/08/2020   Procedure: ESOPHAGOGASTRODUODENOSCOPY (EGD);  Surgeon: Lanita Pitman, MD;  Location: [facility];  Service: Endoscopy;  Laterality: N/A;   ESOPHAGOGASTRODUODENOSCOPY N/A 05/30/2023   Procedure: EGD (ESOPHAGOGASTRODUODENOSCOPY);  Surgeon: Felecia Hopper, MD;  Location: [facility] ENDOSCOPY;  Service: Gastroenterology;  Laterality: N/A;   HEMICOLECTOMY     R sided hemicolectomy   HERNIA REPAIR     Ventral hernia repair   INSERTION OF ARTERIOVENOUS (AV) ARTEGRAFT ARM Left 06/21/2023   Procedure: INSERTION, GRAFT, ARTERIOVENOUS, UPPER EXTREMITY;  Surgeon: Adine Hoof, MD;  Location: [facility] OR;  Service: Vascular;  Laterality: Left;   INSERTION OF DIALYSIS CATHETER Right 06/21/2023   Procedure: INSERTION OF DIALYSIS CATHETER;  Surgeon: Adine Hoof, MD;  Location: [facility] OR;  Service: Vascular;  Laterality: Right;   PTCA  4/06    Family History  Problem Relation Age of Onset   Breast cancer Mother 61   Pancreatic cancer Brother 26   Lung cancer Maternal Aunt    Breast cancer Maternal Aunt    Kidney failure Maternal Aunt    Prostate cancer Maternal Uncle    Heart attack Maternal Grandmother    Breast cancer Maternal Grandmother    Colon cancer Maternal Grandmother    Clotting disorder Maternal Grandmother    Diabetes Maternal Grandmother    Hypertension Daughter    Pancreatic cancer Other     Social History:  reports that she has never smoked. She has never used smokeless tobacco. She reports that she does not drink alcohol and does not use drugs.  Allergies:  Allergies  Allergen Reactions   Anesthetics, Halogenated Other (See Comments)    Per patient's daughter, she can not tolerate anesthetics.    Etomidate  Other (See Comments)    Heart stopped during anesthesia   Fentanyl  Other (See Comments)    Heart stopped during anesthesia    Percocet  [Oxycodone -Acetaminophen ] Other (See Comments)    Headaches    Diazepam Other (See Comments)    Agitation   Lorazepam  Nausea Only   Tramadol  Hcl Nausea And Vomiting   Haloperidol Nausea And Vomiting    **Haldol**   Morphine  Sulfate Nausea Only    agitation   Propoxyphene Nausea And Vomiting    **Darvocet** no longer available   Versed  [Midazolam ] Other (See Comments)    Heart stopped during anesthesia    Medications: I have reviewed the patient's current medications.  Results for orders placed or performed during the hospital encounter of 07/08/23 (from the past 48 hours)  CBC with Differential/Platelet     Status: Abnormal   Collection Time: 07/13/23  9:15 PM  Result Value Ref Range   WBC 19.7 (H) 4.0 - 10.5 K/uL   RBC 2.69 (L) 3.87 -  5.11 MIL/uL   Hemoglobin 7.9 (L) 12.0 - 15.0 g/dL   HCT 46.9 (L) 62.9 - 52.8 %   MCV 93.7 80.0 - 100.0 fL   MCH 29.4 26.0 - 34.0 pg   MCHC 31.3 30.0 - 36.0 g/dL   RDW 41.3 (H) 24.4 - 01.0 %   Platelets 476 (H) 150 - 400 K/uL   nRBC 0.1 0.0 - 0.2 %   Neutrophils Relative % 89 %   Neutro Abs 17.4 (H) 1.7 - 7.7 K/uL   Lymphocytes Relative 6 %   Lymphs Abs 1.1 0.7 - 4.0 K/uL   Monocytes Relative 4 %   Monocytes Absolute 0.8 0.1 - 1.0 K/uL   Eosinophils Relative 0 %   Eosinophils Absolute 0.0 0.0 - 0.5 K/uL   Basophils Relative 0 %   Basophils Absolute 0.1 0.0 - 0.1 K/uL   Immature Granulocytes 1 %   Abs Immature Granulocytes 0.28 (H) 0.00 - 0.07 K/uL    Comment: Performed at [facility], [address] [place], [place] [address]  Comprehensive metabolic panel     Status: Abnormal   Collection Time: 07/13/23  9:15 PM  Result Value Ref Range   Sodium 133 (L) 135 - 145 mmol/L   Potassium 3.7 3.5 - 5.1 mmol/L   Chloride 93 (L) 98 - 111 mmol/L   CO2 20 (L) 22 - 32 mmol/L   Glucose, Bld 238 (H) 70 - 99 mg/dL    Comment: Glucose reference range applies only to samples taken after fasting for at least 8 hours.   BUN 95 (H) 8 - 23  mg/dL   Creatinine, Ser 6.64 (H) 0.44 - 1.00 mg/dL   Calcium  9.5 8.9 - 10.3 mg/dL   Total Protein 6.8 6.5 - 8.1 g/dL   Albumin  2.0 (L) 3.5 - 5.0 g/dL   AST 47 (H) 15 - 41 U/L   ALT 48 (H) 0 - 44 U/L   Alkaline Phosphatase 222 (H) 38 - 126 U/L   Total Bilirubin 0.7 0.0 - 1.2 mg/dL   GFR, Estimated 5 (L) >60 mL/min    Comment: (NOTE) Calculated using the CKD-EPI Creatinine Equation (2021)    Anion gap 20 (H) 5 - 15    Comment: Performed at [facility], [address] [place], [place] [address]  Glucose, capillary     Status: Abnormal   Collection Time: 07/14/23  1:00 AM  Result Value Ref Range   Glucose-Capillary 137 (H) 70 - 99 mg/dL    Comment: Glucose reference range applies only to samples taken after fasting for at least 8 hours.  CBC with Differential/Platelet     Status: Abnormal   Collection Time: 07/14/23 10:57 AM  Result Value Ref Range   WBC 20.4 (H) 4.0 - 10.5 K/uL   RBC 2.93 (L) 3.87 - 5.11 MIL/uL   Hemoglobin 8.4 (L) 12.0 - 15.0 g/dL   HCT 42.5 (L) 95.6 - 38.7 %   MCV 92.2 80.0 - 100.0 fL   MCH 28.7 26.0 - 34.0 pg   MCHC 31.1 30.0 - 36.0 g/dL   RDW 56.4 (H) 33.2 - 95.1 %   Platelets 465 (H) 150 - 400 K/uL   nRBC 0.0 0.0 - 0.2 %   Neutrophils Relative % 87 %   Neutro Abs 17.7 (H) 1.7 - 7.7 K/uL   Lymphocytes Relative 5 %   Lymphs Abs 1.0 0.7 - 4.0 K/uL   Monocytes Relative 6 %   Monocytes Absolute 1.1 (H)  0.1 - 1.0 K/uL   Eosinophils Relative 0 %   Eosinophils Absolute 0.0 0.0 - 0.5 K/uL   Basophils Relative 0 %   Basophils Absolute 0.1 0.0 - 0.1 K/uL   Immature Granulocytes 2 %   Abs Immature Granulocytes 0.42 (H) 0.00 - 0.07 K/uL    Comment: Performed at [facility], [address] [place], [place] [address]  Culture, blood (Routine X 2) w Reflex to ID Panel     Status: None (Preliminary result)   Collection Time: 07/14/23 12:45 PM   Specimen: BLOOD LEFT HAND  Result Value Ref Range   Specimen Description BLOOD LEFT HAND    Special  Requests      AEROBIC BOTTLE ONLY Blood Culture results may not be optimal due to an inadequate volume of blood received in culture bottles   Culture      NO GROWTH < 24 HOURS Performed at [facility], [address], [place], [place] [address]    Report Status PENDING   Culture, blood (Routine X 2) w Reflex to ID Panel     Status: None (Preliminary result)   Collection Time: 07/14/23 12:54 PM   Specimen: BLOOD RIGHT HAND  Result Value Ref Range   Specimen Description BLOOD RIGHT HAND    Special Requests      AEROBIC BOTTLE ONLY Blood Culture results may not be optimal due to an inadequate volume of blood received in culture bottles   Culture      NO GROWTH < 24 HOURS Performed at [facility], [address], [place], [place] [address]    Report Status PENDING   CBC with Differential/Platelet     Status: Abnormal   Collection Time: 07/15/23  8:49 AM  Result Value Ref Range   WBC 15.9 (H) 4.0 - 10.5 K/uL   RBC 2.72 (L) 3.87 - 5.11 MIL/uL   Hemoglobin 7.8 (L) 12.0 - 15.0 g/dL   HCT 40.1 (L) 02.7 - 25.3 %   MCV 93.0 80.0 - 100.0 fL   MCH 28.7 26.0 - 34.0 pg   MCHC 30.8 30.0 - 36.0 g/dL   RDW 66.4 (H) 40.3 - 47.4 %   Platelets 451 (H) 150 - 400 K/uL   nRBC 0.0 0.0 - 0.2 %   Neutrophils Relative % 86 %   Neutro Abs 13.7 (H) 1.7 - 7.7 K/uL   Lymphocytes Relative 6 %   Lymphs Abs 1.0 0.7 - 4.0 K/uL   Monocytes Relative 5 %   Monocytes Absolute 0.9 0.1 - 1.0 K/uL   Eosinophils Relative 1 %   Eosinophils Absolute 0.1 0.0 - 0.5 K/uL   Basophils Relative 0 %   Basophils Absolute 0.0 0.0 - 0.1 K/uL   Immature Granulocytes 2 %   Abs Immature Granulocytes 0.28 (H) 0.00 - 0.07 K/uL    Comment: Performed at [facility], [address] [place], [place] [address]  Vancomycin , random     Status: None   Collection Time: 07/15/23  8:49 AM  Result Value Ref Range   Vancomycin  Rm 26 ug/mL    Comment:        Random Vancomycin  therapeutic range is dependent on dosage  and time of specimen collection. A peak range is 20-40 ug/mL A trough range is 5-15 ug/mL        Performed at [facility], [address] [place], [place] [address]   Urinalysis, w/ Reflex to Culture (Infection Suspected) -Urine,  Clean Catch     Status: Abnormal   Collection Time: 07/15/23  9:29 AM  Result Value Ref Range   Specimen Source URINE, CLEAN CATCH    Color, Urine AMBER (A) YELLOW    Comment: BIOCHEMICALS MAY BE AFFECTED BY COLOR   APPearance CLOUDY (A) CLEAR   Specific Gravity, Urine 1.018 1.005 - 1.030   pH 5.0 5.0 - 8.0   Glucose, UA NEGATIVE NEGATIVE mg/dL   Hgb urine dipstick NEGATIVE NEGATIVE   Bilirubin Urine NEGATIVE NEGATIVE   Ketones, ur NEGATIVE NEGATIVE mg/dL   Protein, ur >=865 (A) NEGATIVE mg/dL   Nitrite NEGATIVE NEGATIVE   Leukocytes,Ua MODERATE (A) NEGATIVE   RBC / HPF 6-10 0 - 5 RBC/hpf   WBC, UA 21-50 0 - 5 WBC/hpf    Comment:        Reflex urine culture not performed if WBC <=10, OR if Squamous epithelial cells >5. If Squamous epithelial cells >5 suggest recollection.    Bacteria, UA RARE (A) NONE SEEN   Squamous Epithelial / HPF 21-50 0 - 5 /HPF   Budding Yeast PRESENT    Ca Oxalate Crys, UA PRESENT     Comment: Performed at [facility], [address], [place], [place] [address]  Hepatitis B surface antigen     Status: None   Collection Time: 07/15/23 11:36 AM  Result Value Ref Range   Hepatitis B Surface Ag NON REACTIVE NON REACTIVE    Comment: Performed at [facility], [address], [place], [place] [address]   *Note: Due to a large number of results and/or encounters for the requested time period, some results have not been displayed. A complete set of results can be found in Results Review.    DG CHEST PORT 1 VIEW Result Date: 07/14/2023 CLINICAL DATA:  Leukocytosis, CHF EXAM: PORTABLE CHEST 1 VIEW COMPARISON:  07/02/2023 FINDINGS: Single frontal view of the chest demonstrates stable right internal jugular  central venous catheter. Cardiac silhouette is unremarkable. No acute airspace disease, effusion, or pneumothorax. No acute bony abnormalities. IMPRESSION: 1. No acute intrathoracic process. Electronically Signed   By: Bobbye Burrow M.D.   On: 07/14/2023 17:13    Review of Systems  HENT:  Negative for ear discharge, ear pain, hearing loss and tinnitus.   Eyes:  Negative for photophobia and pain.  Respiratory:  Negative for cough and shortness of breath.   Cardiovascular:  Negative for chest pain.  Gastrointestinal:  Negative for abdominal pain, nausea and vomiting.  Genitourinary:  Negative for dysuria, flank pain, frequency and urgency.  Musculoskeletal:  Positive for arthralgias (Left knee pain). Negative for back pain, myalgias and neck pain.  Neurological:  Negative for dizziness and headaches.  Hematological:  Does not bruise/bleed easily.  Psychiatric/Behavioral:  The patient is not nervous/anxious.    Blood pressure (!) 143/69, pulse 81, temperature 98.1 F (36.7 C), temperature source Oral, resp. rate 18, height 5\' 4"  (1.626 m), weight 69.9 kg, SpO2 97%. Physical Exam Constitutional:      General: She is not in acute distress.    Appearance: She is well-developed. She is not diaphoretic.  HENT:     Head: Normocephalic and atraumatic.  Eyes:     General: No scleral icterus.       Right eye: No discharge.        Left eye: No discharge.     Conjunctiva/sclera: Conjunctivae normal.  Cardiovascular:     Rate and Rhythm: Normal rate and regular rhythm.  Pulmonary:     Effort: Pulmonary effort is normal. No respiratory distress.  Musculoskeletal:     Cervical back: Normal range of motion.     Comments: LLE No traumatic wounds, ecchymosis, or rash  Mod diffuse knee TTP  Mod knee effusion  Sens DPN, SPN, TN intact  Motor EHL, ext, flex, evers 5/5  DP 2+, PT 2+, No significant edema  Skin:    General: Skin is warm and dry.  Neurological:     Mental Status: She is alert.   Psychiatric:        Mood and Affect: Mood normal.        Behavior: Behavior normal.     Assessment/Plan: Left knee pain -- Only 0.14ml of yellow fluid obtained, some turbidity. Unlikely to represent septic arthritis but will send for cell count.    Georganna Kin, PA-C Orthopedic Surgery [phone] 07/15/2023, 12:31 PM

## 2023-07-15 NOTE — Plan of Care (Signed)
  Problem: RH BOWEL ELIMINATION Goal: RH STG MANAGE BOWEL W/MEDICATION W/ASSISTANCE Description: STG Manage Bowel with Medication with mod I Assistance. Outcome: Progressing   Problem: RH SAFETY Goal: RH STG ADHERE TO SAFETY PRECAUTIONS W/ASSISTANCE/DEVICE Description: STG Adhere to Safety Precautions With cues Assistance/Device. Outcome: Progressing   Problem: RH PAIN MANAGEMENT Goal: RH STG PAIN MANAGED AT OR BELOW PT'S PAIN GOAL Description: < 4 with prns Outcome: Progressing   Problem: Fluid Volume: Goal: Compliance with measures to maintain balanced fluid volume will improve Description: Patient and family will be able to manage care at discharge using educational resources for 1200 cc fluid restriction/ dietary modification recommendations independently Outcome: Progressing

## 2023-07-15 NOTE — Progress Notes (Signed)
 Occupational Therapy Session Note  Patient Details  Name: [name] MRN: [id number] Date of Birth: Feb 18, 1946  Today's Date: 07/15/2023 OT Individual Time: 1035-1130 & 1305-1330 & 1420-1530 OT Individual Time Calculation (min): 55 min & 25 min & 70 min   Short Term Goals: Week 1:  OT Short Term Goal 1 (Week 1): Pt will complete sit > stand in prep for ADL with CGA using LRAD OT Short Term Goal 2 (Week 1): Pt will complete toilet transfer with CGA using LRAD OT Short Term Goal 3 (Week 1): Pt will initiate toileting need with min questioning cues OT Short Term Goal 4 (Week 1): Pt will complete 1/3 toileting steps with min A for balance  Skilled Therapeutic Interventions/Progress Updates:  Session 1 Skilled OT intervention completed with focus on ADL Retraining, functional sit > stands, plan for pain intervention with medical team. Pt received seated in w/c, agreeable to session. Unrated pain reported in R knee; pre-medicated. OT offered rest breaks, repositioning throughout for pain reduction.   Granddaughter present, with questions about pt's pain intervention. Informed pt's family that OT inquired pt's ability to get knee injection however MD reported the hold off was due to elevated WBC. Relayed to MD pt's daughter/granddaughter's questions regarding trial with different medication for pain intervention and/or potential need for aspiration of knee to determine septic arthritis.  With questioning cues, pt indicated need to use bathroom. Utilized stedy for pain management during session. Heavy min A sit > stand in stedy, then CGA sit > stand from perched position however unable to ever achieve fully erect position due to bilateral knee flexion and R knee pain. Max A to lower clothing. Pt continent of urinary void, however BM smear in brief therefore doffed/donned brief with total A. Pt able to complete seated pericare with supervision. Stood with heavy min A from toilet in stedy, with  max A for clothing management. Stedy transfer > sink.   In perched position in stedy to address upright tolerance and core stability, pt doffed UB clothing with mod A, bathed UB and applied vaseline with min A. Donned bra with mod A however upon max A donning of shirt pt became minimally verbal, closed her eyes and had mild resting tremors. Pt was able to indicate feeling dizzy. Min A sit > stand from perched position to w/c. Pt remained with eyes closed, however verbalized feeling the dark shadows/tunnels and no change in dizziness symptoms requesting to lay down. Completed squat pivot with mod A +2 > EOB for safety/BP management, then max A sit > supine. Nurse notified. Pt with resolved symptoms only after BLE elevated and extended rest. BP assessed after in supine position for several mins; 143/69 however anticipate BP was much lower when upright just unable to leave pt unattended due to safety concern. Direct care handoff to nursing at end of session with all needs met.  Session 2 Skilled OT intervention completed with focus on functional sit > stands and transfers. Pt received upright in bed with family member present, pt agreeable to session. Unrated pain reported in R knee; pre-medicated. OT offered rest breaks, repositioning throughout for pain reduction.  Upon removal of blankets, pt without pants and noted to have band aid with mild bleed through on L knee following aspiration procedure. Pt with R knee sleeve in place. OT consulted nursing to determine if pt received aspiration procedure in correct knee with family requesting an in person visit by nursing/PA.  Family member donned pants max A at  bed level. Pt transitioned > EOB with min A for BLE. Cues needed to bring both feet to floor. BP assessed as seen below due to earlier dizziness episode. From elevated bed, pt stood with mod A using RW with cues for hand placement on RW, then mod A stand pivot with RW > w/c with cues needed for backing up  to chair prior to premature sitting. Pt remained seated in w/c, with family present and with all needs in reach at end of session.  Vitals BP 142/59 (seated EOB) BP 136/53 (seated; after transfer to w/c)   Session 3 Skilled OT intervention completed with focus on ADL retraining, sit > stands, standing tolerance, dynamic standing balance, cognition. Pt received seated in w/c, agreeable to session. R knee pain reported (unrated); nurse notified of med request. OT offered rest breaks and repositioning throughout for pain reduction.  With questioning cues, pt indicated need to void. Utilized stedy for time/pain management. Mod A sit > stand in stedy and CGA sit > stand from perched position > toilet. Max A to doff clothing. Pt continent of urinary void only. Required mod A to stand using stedy, Min A for balance during self wiping, then max A donning of pants over hips. CGA sit > stand from perched position > w/c. Seated at sink pt was supervision for hand hygiene.  Transported dependently in w/c <> gym. Pt participated in the following activities to promote independence and safety during BADLs and functional mobility: -mod A sit > stand using elevated table X2 trials. Required min A for standing balance each trial while using BUE to engage in card sorting task (by number). Pt with difficulty avoiding use of both elbows on table due to poor fully erect posture and pain level. Initial min A fading to supervision for cognitive aspect of task -seated (due to pain/fatigue with pt unable to continue standing per report) in w/c, pt sorted cards by shape and color, however required max to total A for task consistently, with inability to recall directions of task without significant assist  Seated in w/c, pt reached retrieved squigz from long mirror to promote anterior weight shifting needed for sit > stand prep as well as BUE AROM needed for BADL engagement. Pt with difficulty using LUE to reach above shoulder  height, requiring assist from RUE to retrieve.   Back in room, pt remained seated in w/c, with belt alarm on/activated, and with all needs in reach at end of session.    Therapy Documentation Precautions:  Precautions Precautions: Fall Precaution/Restrictions Comments: dementia Restrictions Weight Bearing Restrictions Per Provider Order: No    Therapy/Group: Individual Therapy  Ruthanna Covert, MS, OTR/L  07/15/2023, 3:33 PM

## 2023-07-15 NOTE — Progress Notes (Signed)
 PROGRESS NOTE   Subjective/Complaints: WBC elevated, asked [name] if he can aspirate knee today to r/o septic arthritis, will hold off on steroid injection at this time, discussed oxycodone  for pain relief  ROS: +upper and lower extremity weakness, +bowel incontinence, +right knee pain-limits therapy. Denies CP, SOB, abd pain, n/v/d/c   Objective:   DG CHEST PORT 1 VIEW Result Date: 07/14/2023 CLINICAL DATA:  Leukocytosis, CHF EXAM: PORTABLE CHEST 1 VIEW COMPARISON:  07/02/2023 FINDINGS: Single frontal view of the chest demonstrates stable right internal jugular central venous catheter. Cardiac silhouette is unremarkable. No acute airspace disease, effusion, or pneumothorax. No acute bony abnormalities. IMPRESSION: 1. No acute intrathoracic process. Electronically Signed   By: Bobbye Burrow M.D.   On: 07/14/2023 17:13    Recent Labs    07/14/23 1057 07/15/23 0849  WBC 20.4* 15.9*  HGB 8.4* 7.8*  HCT 27.0* 25.3*  PLT 465* 451*    Recent Labs    07/13/23 2115  NA 133*  K 3.7  CL 93*  CO2 20*  GLUCOSE 238*  BUN 95*  CREATININE 8.20*  CALCIUM  9.5     Intake/Output Summary (Last 24 hours) at 07/15/2023 1103 Last data filed at 07/15/2023 1610 Gross per 24 hour  Intake 577 ml  Output --  Net 577 ml        Physical Exam: Vital Signs Blood pressure (!) 164/63, pulse 80, temperature 98.1 F (36.7 C), temperature source Oral, resp. rate 18, height 5\' 4"  (1.626 m), weight 69.9 kg, SpO2 100%.  Gen: no distress, normal appearing, resting in bed.  HEENT: oral mucosa pink and moist, NCAT Cardio: Reg rate, no m/r/g appreciated Chest: normal effort, normal rate of breathing, CTAB Abd: soft, non-distended, +BS throughout, nonTTP Ext/MsK: no edema peripherally, LUE with graft site at L elbow with very faint erythema and slight warmth noted, no red streaking. Seems better than previously described. R knee tender, with  effusion, but no erythema or significant warmth.  Psychiatric:     Comments: Pt flat, cooperative   PRIOR EXAMS: Musculoskeletal:        General: Swelling and tenderness (left antecubital region) present.  Right knee swollen and tender    Cervical back: Normal range of motion.  Skin:    Comments: AVF site CDI. No erythema. Right heel boggy  Neurological:     Mental Status: She is alert.     Comments: Oriented to self only. Unable to recall age, DOB or place. Poor recall and decrease insight into deficits. She was able to follow simple motor commands. No CN deficits. Limited RUE d/t HD catheter. LUE limited d/t pain. Grip/wrist at least 3-4/5 bilaterally. BLE 3- to 3/5 HF, KE and 4/5 ADF/PF. Decreased LT from ankles down bilaterally. No abnl resting tone. DTR's tr to 1+, stable 4/28     Assessment/Plan: 1. Functional deficits which require 3+ hours per day of interdisciplinary therapy in a comprehensive inpatient rehab setting. Physiatrist is providing close team supervision and 24 hour management of active medical problems listed below. Physiatrist and rehab team continue to assess barriers to discharge/monitor patient progress toward functional and medical goals  Care Tool:  Bathing    Body  parts bathed by patient: Right arm, Left arm, Chest, Abdomen, Face   Body parts bathed by helper: Front perineal area, Buttocks, Right upper leg, Right lower leg, Left lower leg, Left upper leg     Bathing assist Assist Level: Maximal Assistance - Patient 24 - 49%     Upper Body Dressing/Undressing Upper body dressing   What is the patient wearing?: Pull over shirt, Bra    Upper body assist Assist Level: Maximal Assistance - Patient 25 - 49%    Lower Body Dressing/Undressing Lower body dressing      What is the patient wearing?: Incontinence brief, Pants     Lower body assist Assist for lower body dressing: Maximal Assistance - Patient 25 - 49%     Toileting Toileting     Toileting assist Assist for toileting: Moderate Assistance - Patient 50 - 74%     Transfers Chair/bed transfer  Transfers assist     Chair/bed transfer assist level: Moderate Assistance - Patient 50 - 74%     Locomotion Ambulation   Ambulation assist      Assist level: 2 helpers Assistive device: Walker-rolling Max distance: 73ft   Walk 10 feet activity   Assist  Walk 10 feet activity did not occur: Safety/medical concerns (pain)  Assist level: 2 helpers Assistive device: Walker-rolling   Walk 50 feet activity   Assist Walk 50 feet with 2 turns activity did not occur: Safety/medical concerns (pain)         Walk 150 feet activity   Assist Walk 150 feet activity did not occur: Safety/medical concerns (pain)         Walk 10 feet on uneven surface  activity   Assist Walk 10 feet on uneven surfaces activity did not occur: Safety/medical concerns (pain)         Wheelchair     Assist Is the patient using a wheelchair?: Yes Type of Wheelchair: Manual (lightweight)    Wheelchair assist level: Supervision/Verbal cueing Max wheelchair distance: 18ft    Wheelchair 50 feet with 2 turns activity    Assist        Assist Level: Supervision/Verbal cueing   Wheelchair 150 feet activity     Assist      Assist Level: Supervision/Verbal cueing   Blood pressure (!) 164/63, pulse 80, temperature 98.1 F (36.7 C), temperature source Oral, resp. rate 18, height 5\' 4"  (1.626 m), weight 69.9 kg, SpO2 100%.  Medical Problem List and Plan: 1. Functional deficits secondary to debility after acute respiratory failure and multiple medical issues             -patient may not yet shower             -ELOS/Goals: 10-14 days, supervision goals with PT, OT, SLP  Left message for daughter to update her  -continue CIR 2.  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 Acute on chronic LBP/Pain Management: Continue Hydrocodone  prn severe pain--monitor for sedation/confusion.              --Was on IV dilaudid  till 04/16-->now on hydrocodone  10 TID on average.              --increased use of Zanaflex  in past 48 hours.              --continue Gabapentin  300 mg/hs 4. Hx of depression/anxiety/Sleep: LCSW to follow for evaluation  and support             --melatonin prn for insomnia.              -antipsychotic agents: N/A             -pt on chronic prozac  20mg  daily and remeron  7.5mg  nightly 5. Neuropsych/cognition: This patient is not capable of making decisions on her own behalf. 6. Skin/Wound Care: Routine pressure relief measures.              -float both heels 7. Fluids/Electrolytes/Nutrition: Strict I/O. Labs with HD             --1200 cc FR/day.  8. Cellulitis/Possible abscess LUE: On Van since 04/19 and Zosyn  since 04/17 --WBC trending down to 10.5 today. BC X 2 from 04/19 pending--negative so far.  --has been afebrile X 24 hours.  Zosyn  changed to Augmentin  by Dr. Sunnie England. End date for Vanc/augmentin  is 4/29              -pt reports that arm is feeling less tender -07/14/23 no fevers but now worsening leukocytosis, WBC 19.7 yesterday and 20.4 on repeat today, LUE not really that concerning looking -concerned for bacteremia, will repeat BCx (prior ones were NGTD x5d) -check CXR, U/A -when knee aspiration/injection is done, would recommend sending Cx -consulted pharmacy to see about abx choice/dosing particularly of the augmentin -- Ivery Marking said to switch augmentin  to ceftriaxone  and leave vanc for now -recheck CBC tomorrow morning  9. H/o GIB: Monitor H/H as had further drop. Hgb down to             On IV iron /ESA  -07/13/23 Hgb stable last labs 4/20, monitor labs  -07/14/23 Hgb 8.4, stable/improving; monitor  10. HTN: Monitor BP TID--on Torsemide  on non dialysis days.   -4/26-27/25 [name] good, monitor  Vitals:   07/13/23 2357  07/14/23 0000 07/14/23 0030 07/14/23 0045  BP: (!) 145/114 (!) 145/114 128/71 (!) 132/59   07/14/23 0100 07/14/23 0104 07/14/23 0109 07/14/23 0146  BP: (!) 81/45 110/78 (!) 123/54 (!) 126/45   07/14/23 0437 07/14/23 1300 07/14/23 2015 07/15/23 0430  BP: (!) 114/46 (!) 95/44 (!) 135/52 (!) 164/63     11. 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 H/o Gastritis/esophagitis: Most recent admission last month. Continue Protonix  40mg  BID.  --Has required one unit PRBC 04/16.              --monitor for signs of bleeding.   13. Chronic diarrhea:  continue Colestid  1g TID and Creon  36kU tid- changed to after meals as per daughter's request -07/13/23 no BM in 2 days, monitor today, if nothing by tomorrow may need to consider meds.  -07/14/23 no BM still but pt not that concerned; if she hasn't had a BM today, then tomorrow would consider some colace  14. H/o cognitive/memory deficits: continue Exelon  1.5mg  bid.    15. ESRD: Now HD dependent--TTS at the end of the day to help with tolerance of therapy             --continue renveal for metabolic bone disease.  Per nephro  17. T2DM: Hgb A1c- 6.2 and diet controlled. Add CM restrictions.  -d/c ISS, d/c CBGs  18. Bilateral knee pain: voltaren  gel scheduled, right knee sleeve ordered, continue  19. Bowel incontinence: lumbar MRI showed and discussed there is no evidence of cauda equina  20. New upper extremity weakness: cervical MRI ordered and discussed that there  is moderate canal stenosis, discussed with daughter that oral steroids, steroid injection, surgery are possible interventions- deferred at this time given patient's risk factors  21. Right knee effusion: asked ortho to aspirate and send fluid for culture to r/o septic arthritis  21. Leukocytosis: repeat CBC ordered for today, reviewed and downtrending, procal reviewed and is <.1  LOS: 7 days A FACE TO FACE EVALUATION WAS PERFORMED  [name] 07/15/2023, 11:03  AM

## 2023-07-15 NOTE — Progress Notes (Signed)
 Unable to obtain U/A sample this shift due to pt not voided. Notified on call provider [address] ([place]). Will pass on to next shift to follow up.  Bertram Brocks  RN

## 2023-07-15 NOTE — Procedures (Signed)
 Procedure: Right knee aspiration and injection   Indication: Right knee effusion(s)   Surgeon: Starr Eddy, PA-C   Assist: None   Anesthesia: Topical refrigerant   EBL: None   Complications: None   Findings: After risks/benefits explained patient desires to undergo procedure. Consent obtained and time out performed. The right knee was sterilely prepped and aspirated. 0.33ml turbid yellow fluid obtained. 6ml 0.5% Marcaine  instilled. Pt tolerated the procedure well.       Georganna Kin, PA-C Orthopedic Surgery [phone]

## 2023-07-15 NOTE — Progress Notes (Signed)
 Patient ID: [name], female   DOB: 08/10/1945, 78 y.o.   MRN: [id number]  HPI:  Pt is a 78 y.o.  with  hypertension, renal vein thrombosis on Eliquis , stroke, type II DM, lupus, CKD 4-5 who was recently admitted for CHF exacerbation and progressive CKD presented back with worsening dyspnea on exertion and fluid overload.    Assessment/Plan:   # Progressive CKD 5 versus AKI on CKD,: With frequent hospitalization for fluid overload.  Responding to IV diuretics but not po and with worsening renal function d/w patient and family (Daughter on phone 4/2) at length recommend starting dialysis. S/p LUE AVG + TDC placement 4/4 -> AVG removed bec of steal -Continue dialysis on TTS schedule - CLIP complete-  has been accepted at [facility]   # CKD-MBD/hyperphosphatemia: Increased sevelamer  on 4/21 to 2 TIDM. PTH at goal. Repeat on 5/1   #Anemia of CKD: Concern about GI bleed, underwent endoscopy t recently with no sign of bleeding.  Back on Eliquis .  Has required transfusion.  Given IV iron  and ESA   # Hypertension/volume: [name] improved after diuresis.  Torsemide  on non-dialysis days   #A fib: on eliquis    # Vascular access - developed steal syndrome of left hand. - VVS following s/p excision of AVG 07/01/23 due to concern for steal syndrome with improvement of symptoms and ok to resume anticoagulation.    #Disposition -now in CIR  S: Feels well today. May have knee aspiration and/or injection.  Denies fever, chills, nausea, vomiting.  Appetite decent.  O:BP (!) 164/63 (BP Location: Left Arm)   Pulse 80   Temp 98.1 F (36.7 C) (Oral)   Resp 18   Ht 5\' 4"  (1.626 m)   Wt 69.9 kg   SpO2 100%   BMI 26.45 kg/m   Intake/Output Summary (Last 24 hours) at 07/15/2023 0852 Last data filed at 07/15/2023 4098 Gross per 24 hour  Intake 777 ml  Output --  Net 777 ml   Intake/Output: I/O last 3 completed shifts: In: 657 [P.O.:557; IV Piggyback:100] Out: 1.1 [Other:1.1]  Intake/Output  this shift:  Total I/O In: 240 [P.O.:240] Out: -  Weight change: -0.2 kg Gen: NAD lying in bed CVS: Normal rate, no rub Resp: Bilateral chest rise with no increased work of breathing Abd: +BS, soft, NT/ND Ext: no edema, warm and well-perfused Access: Right IJ TC  Recent Labs  Lab 07/09/23 0505 07/13/23 2115  NA 132* 133*  K 4.2 3.7  CL 95* 93*  CO2 23 20*  GLUCOSE 84 238*  BUN 36* 95*  CREATININE 4.66* 8.20*  ALBUMIN  2.1* 2.0*  CALCIUM  9.4 9.5  PHOS 6.7*  --   AST  --  47*  ALT  --  48*   Liver Function Tests: Recent Labs  Lab 07/09/23 0505 07/13/23 2115  AST  --  47*  ALT  --  48*  ALKPHOS  --  222*  BILITOT  --  0.7  PROT  --  6.8  ALBUMIN  2.1* 2.0*   No results for input(s): "LIPASE", "AMYLASE" in the last 168 hours. No results for input(s): "AMMONIA" in the last 168 hours. CBC: Recent Labs  Lab 07/09/23 0505 07/10/23 0545 07/13/23 2115 07/14/23 1057  WBC 11.9* 11.9* 19.7* 20.4*  NEUTROABS  --  9.2* 17.4* 17.7*  HGB 8.6* 8.2* 7.9* 8.4*  HCT 27.8* 26.1* 25.2* 27.0*  MCV 92.1 92.2 93.7 92.2  PLT 348 357 476* 465*   Cardiac Enzymes: No results for  input(s): "CKTOTAL", "CKMB", "CKMBINDEX", "TROPONINI" in the last 168 hours. CBG: Recent Labs  Lab 07/09/23 0634 07/09/23 1213 07/09/23 2039 07/10/23 2048 07/14/23 0100  GLUCAP 83 165* 161* 157* 137*    Iron  Studies: No results for input(s): "IRON ", "TIBC", "TRANSFERRIN", "FERRITIN" in the last 72 hours. Studies/Results: DG CHEST PORT 1 VIEW Result Date: 07/14/2023 CLINICAL DATA:  Leukocytosis, CHF EXAM: PORTABLE CHEST 1 VIEW COMPARISON:  07/02/2023 FINDINGS: Single frontal view of the chest demonstrates stable right internal jugular central venous catheter. Cardiac silhouette is unremarkable. No acute airspace disease, effusion, or pneumothorax. No acute bony abnormalities. IMPRESSION: 1. No acute intrathoracic process. Electronically Signed   By: Bobbye Burrow M.D.   On: 07/14/2023 17:13      apixaban   5 mg Oral BID   B-complex with vitamin C  1 tablet Oral Daily   bupivacaine (PF)  10 mL Infiltration Once   Chlorhexidine  Gluconate Cloth  6 each Topical Q12H   cholecalciferol   1,000 Units Oral Daily   colestipol   1 g Oral TID AC   darbepoetin (ARANESP ) injection - DIALYSIS  200 mcg Subcutaneous Q Sat-1800   diclofenac  Sodium  2 g Topical QID   FLUoxetine   20 mg Oral Daily   gabapentin   300 mg Oral QHS   l-methylfolate-B6-B12  1 tablet Oral Daily   lipase/protease/amylase  36,000 Units Oral TID AC   methylPREDNISolone  acetate  40 mg Intra-articular Once   mirtazapine   7.5 mg Oral QHS   multivitamin  1 tablet Oral QHS   pantoprazole   40 mg Oral BID   rivastigmine   1.5 mg Oral BID   sevelamer  carbonate  1,600 mg Oral TID WC   torsemide   100 mg Oral Q M,W,F   And   torsemide   100 mg Oral Q Sun    BMET    Component Value Date/Time   NA 133 (L) 07/13/2023 2115   NA 144 07/19/2020 1458   NA 140 03/14/2017 0810   K 3.7 07/13/2023 2115   K 3.8 03/14/2017 0810   CL 93 (L) 07/13/2023 2115   CO2 20 (L) 07/13/2023 2115   CO2 27 03/14/2017 0810   GLUCOSE 238 (H) 07/13/2023 2115   GLUCOSE 177 (H) 03/14/2017 0810   BUN 95 (H) 07/13/2023 2115   BUN 85 (HH) 07/19/2020 1458   BUN 35.6 (H) 03/14/2017 0810   CREATININE 8.20 (H) 07/13/2023 2115   CREATININE 2.31 (H) 10/25/2022 1355   CREATININE 1.8 (H) 03/14/2017 0810   CALCIUM  9.5 07/13/2023 2115   CALCIUM  8.3 (L) 06/18/2023 0334   CALCIUM  9.6 03/14/2017 0810   GFRNONAA 5 (L) 07/13/2023 2115   GFRNONAA 22 (L) 10/25/2022 1355   GFRNONAA 45 (L) 12/17/2013 1158   GFRAA 21 (L) 12/21/2019 0845   GFRAA 52 (L) 12/17/2013 1158   CBC    Component Value Date/Time   WBC 20.4 (H) 07/14/2023 1057   RBC 2.93 (L) 07/14/2023 1057   HGB 8.4 (L) 07/14/2023 1057   HGB 9.4 (L) 04/18/2023 1434   HGB 9.3 (L) 03/14/2017 0810   HCT 27.0 (L) 07/14/2023 1057   HCT 26.4 (L) 07/21/2019 0809   HCT 29.2 (L) 03/14/2017 0810   PLT 465 (H)  07/14/2023 1057   PLT 235 04/18/2023 1434   PLT 292 03/14/2017 0810   PLT 351 10/11/2016 1139   MCV 92.2 07/14/2023 1057   MCV 81.3 03/14/2017 0810   MCH 28.7 07/14/2023 1057   MCHC 31.1 07/14/2023 1057   RDW 16.8 (H) 07/14/2023 1057  RDW 14.0 03/14/2017 0810   LYMPHSABS 1.0 07/14/2023 1057   LYMPHSABS 1.4 03/14/2017 0810   MONOABS 1.1 (H) 07/14/2023 1057   MONOABS 0.4 03/14/2017 0810   EOSABS 0.0 07/14/2023 1057   EOSABS 0.1 03/14/2017 0810   BASOSABS 0.1 07/14/2023 1057   BASOSABS 0.0 03/14/2017 0810

## 2023-07-15 NOTE — Progress Notes (Signed)
 Occupational Therapy Weekly Progress Note  Patient Details  Name: [name] MRN: [id number] Date of Birth: 1946/01/29  Beginning of progress report period: July 09, 2023 End of progress report period: July 15, 2023   Patient has met 2 of 4 short term goals. Pt is making slower than anticipated progress towards LTGs. She is able to bathe at an overall mod A level, dress with mod/max A and requires mod/max assist for toileting tasks. Pt continues to demonstrate high pain levels in R knee (limited relief with knee sleeve; awaiting injection), dynamic standing balance, gross motor coordination and functional endurance deficits resulting in difficulty completing BADL tasks without increased physical assist. Pt with baseline dementia and cognitive deficits- affecting pt's carryover with cues and command following. Pt will benefit from continued skilled OT services to focus on mentioned deficits. Family ed not initiated as of date and will be needed in prep for DC.   Patient continues to demonstrate the following deficits: muscle weakness, decreased cardiorespiratoy endurance, decreased coordination, decreased initiation, decreased awareness, decreased problem solving, decreased safety awareness, decreased memory, and delayed processing, and decreased standing balance and therefore will continue to benefit from skilled OT intervention to enhance overall performance with BADL and Reduce care partner burden.  Patient progressing toward long term goals..  Continue plan of care.  OT Short Term Goals Week 1:  OT Short Term Goal 1 (Week 1): Pt will complete sit > stand in prep for ADL with CGA using LRAD OT Short Term Goal 1 - Progress (Week 1): Not met OT Short Term Goal 2 (Week 1): Pt will complete toilet transfer with CGA using LRAD OT Short Term Goal 2 - Progress (Week 1): Not met OT Short Term Goal 3 (Week 1): Pt will initiate toileting need with min questioning cues OT Short Term Goal 3 -  Progress (Week 1): Met OT Short Term Goal 4 (Week 1): Pt will complete 1/3 toileting steps with min A for balance OT Short Term Goal 4 - Progress (Week 1): Met Week 2:  OT Short Term Goal 1 (Week 2): Pt will complete sit > stand in prep for ADL with CGA using LRAD OT Short Term Goal 2 (Week 2): Pt will complete toilet transfer with CGA using LRAD OT Short Term Goal 3 (Week 2): Pt will follow 1 step commands to complete LB dressing with min A    Katie Portman E Yamilett Anastos, MS, OTR/L  07/15/2023, 7:52 AM

## 2023-07-15 NOTE — Procedures (Signed)
 Procedure: Left knee aspiration and injection   Indication: Left knee effusion(s)   Surgeon: Starr Eddy, PA-C   Assist: None   Anesthesia: Topical refrigerant   EBL: None   Complications: None   Findings: After risks/benefits explained patient desires to undergo procedure. Consent obtained and time out performed. The left knee was sterilely prepped and aspirated.0.19ml turbid yellow fluid obtained. 6ml 0.5% Marcaine  instilled. Pt tolerated the procedure well.       Georganna Kin, PA-C Orthopedic Surgery [phone]

## 2023-07-16 DIAGNOSIS — M11261 Other chondrocalcinosis, right knee: Secondary | ICD-10-CM

## 2023-07-16 DIAGNOSIS — B9689 Other specified bacterial agents as the cause of diseases classified elsewhere: Secondary | ICD-10-CM

## 2023-07-16 DIAGNOSIS — M11262 Other chondrocalcinosis, left knee: Secondary | ICD-10-CM

## 2023-07-16 DIAGNOSIS — L02414 Cutaneous abscess of left upper limb: Secondary | ICD-10-CM

## 2023-07-16 LAB — CBC WITH DIFFERENTIAL/PLATELET
Abs Immature Granulocytes: 0.32 10*3/uL — ABNORMAL HIGH (ref 0.00–0.07)
Basophils Absolute: 0.1 10*3/uL (ref 0.0–0.1)
Basophils Relative: 0 %
Eosinophils Absolute: 0.1 10*3/uL (ref 0.0–0.5)
Eosinophils Relative: 1 %
HCT: 26.6 % — ABNORMAL LOW (ref 36.0–46.0)
Hemoglobin: 8.3 g/dL — ABNORMAL LOW (ref 12.0–15.0)
Immature Granulocytes: 3 %
Lymphocytes Relative: 8 %
Lymphs Abs: 1 10*3/uL (ref 0.7–4.0)
MCH: 28.6 pg (ref 26.0–34.0)
MCHC: 31.2 g/dL (ref 30.0–36.0)
MCV: 91.7 fL (ref 80.0–100.0)
Monocytes Absolute: 0.7 10*3/uL (ref 0.1–1.0)
Monocytes Relative: 5 %
Neutro Abs: 10.5 10*3/uL — ABNORMAL HIGH (ref 1.7–7.7)
Neutrophils Relative %: 83 %
Platelets: 474 10*3/uL — ABNORMAL HIGH (ref 150–400)
RBC: 2.9 MIL/uL — ABNORMAL LOW (ref 3.87–5.11)
RDW: 16.9 % — ABNORMAL HIGH (ref 11.5–15.5)
WBC: 12.6 10*3/uL — ABNORMAL HIGH (ref 4.0–10.5)
nRBC: 0.2 % (ref 0.0–0.2)

## 2023-07-16 LAB — CBC
HCT: 24.9 % — ABNORMAL LOW (ref 36.0–46.0)
Hemoglobin: 7.6 g/dL — ABNORMAL LOW (ref 12.0–15.0)
MCH: 28.4 pg (ref 26.0–34.0)
MCHC: 30.5 g/dL (ref 30.0–36.0)
MCV: 92.9 fL (ref 80.0–100.0)
Platelets: 470 10*3/uL — ABNORMAL HIGH (ref 150–400)
RBC: 2.68 MIL/uL — ABNORMAL LOW (ref 3.87–5.11)
RDW: 16.8 % — ABNORMAL HIGH (ref 11.5–15.5)
WBC: 14.9 10*3/uL — ABNORMAL HIGH (ref 4.0–10.5)
nRBC: 0.2 % (ref 0.0–0.2)

## 2023-07-16 LAB — PROCALCITONIN: Procalcitonin: 6.26 ng/mL

## 2023-07-16 LAB — HEPATITIS B SURFACE ANTIBODY, QUANTITATIVE: Hep B S AB Quant (Post): <3.5 m[IU]/mL — ABNORMAL LOW

## 2023-07-16 LAB — RENAL FUNCTION PANEL
Albumin: 2 g/dL — ABNORMAL LOW (ref 3.5–5.0)
Anion gap: 19 — ABNORMAL HIGH (ref 5–15)
BUN: 94 mg/dL — ABNORMAL HIGH (ref 8–23)
CO2: 19 mmol/L — ABNORMAL LOW (ref 22–32)
Calcium: 9.5 mg/dL (ref 8.9–10.3)
Chloride: 92 mmol/L — ABNORMAL LOW (ref 98–111)
Creatinine, Ser: 8.11 mg/dL — ABNORMAL HIGH (ref 0.44–1.00)
GFR, Estimated: 5 mL/min — ABNORMAL LOW (ref >60)
Glucose, Bld: 153 mg/dL — ABNORMAL HIGH (ref 70–99)
Phosphorus: 4.9 mg/dL — ABNORMAL HIGH (ref 2.5–4.6)
Potassium: 4 mmol/L (ref 3.5–5.1)
Sodium: 130 mmol/L — ABNORMAL LOW (ref 135–145)

## 2023-07-16 LAB — GLUCOSE, CAPILLARY: Glucose-Capillary: 151 mg/dL — ABNORMAL HIGH (ref 70–99)

## 2023-07-16 MED ORDER — HEPARIN SODIUM (PORCINE) 1000 UNIT/ML IJ SOLN
3200.0000 [IU] | Freq: Once | INTRAMUSCULAR | Status: AC
  Administered 2023-07-16: 3200 [IU]

## 2023-07-16 MED ORDER — HEPARIN SODIUM (PORCINE) 1000 UNIT/ML IJ SOLN
INTRAMUSCULAR | Status: AC
  Filled 2023-07-16: qty 4

## 2023-07-16 MED ORDER — ALBUMIN HUMAN 25 % IV SOLN
25.0000 g | Freq: Once | INTRAVENOUS | Status: AC
Start: 2023-07-16 — End: 2023-07-16
  Administered 2023-07-16: 25 g via INTRAVENOUS

## 2023-07-16 MED ORDER — COLCHICINE 0.6 MG PO TABS
0.6000 mg | ORAL_TABLET | Freq: Once | ORAL | Status: AC
  Administered 2023-07-16: 0.6 mg via ORAL
  Filled 2023-07-16: qty 1

## 2023-07-16 NOTE — Progress Notes (Signed)
 Per PT, they would like to schedule family edu later this week.   SW left message for pt dtr Josephus Nida to discuss above.   Norval Been, MSW, LCSW Office: [phone] Cell: [phone] Fax: [phone]

## 2023-07-16 NOTE — Progress Notes (Signed)
 Physical Therapy Session Note  Patient Details  Name: [name] MRN: [id number] Date of Birth: 21-Sep-1945  Today's Date: 07/16/2023 PT Individual Time: 1000-1115 and 1130-1155 PT Individual Time Calculation (min): 75 min and 25 min  Short Term Goals: Week 1:  PT Short Term Goal 1 (Week 1): pt will transfer bed<>chair with LRAD and CGA PT Short Term Goal 2 (Week 1): pt will transfer sit<>stand with LRAD and CGA PT Short Term Goal 3 (Week 1): pt will ambulate 62ft with LRAD and CGA  Skilled Therapeutic Interventions/Progress Updates:   Treatment Session 1 Received pt sitting in [facility] with laboratory present to draw blood. Pt agreeable to PT treatment and reported pain 9/10 in R knee - RN notified but reported pt up to date on all pain medication; provided pt with ice pack for pain relief. Session with emphasis on functional mobility/transfers, generalized strengthening and endurance, dynamic standing balance/coordination, and ambulation.   Discussed plan for [facility] evaluation with plan for lightweight manual WC and pt in agreement. Briefly reviewed home set up with pt reporting living with her daughter with 2 STE and 2 handrails to enter home - need to practice in upcoming sessions. Transported to/from room in [facility] dependently for time management purposes. Switched out current [employer] for lightweight recline/tilt chair - Liberty FT. Stood from [facility] with RW and heavy mod A and performed stand<>pivot with RW and min A into new WC. Pt reports [employer] FT is more comfortable and tilt feature allows pt opportunity to take pressure off spine.   Stood from [facility] with RW and mod A x 2 trials and ambulated 73ft x 1 and 47ft x 1 with RW and min A with close WC follow. Pt ultimately limited by R knee pain. Pt still stands with trunk flexed, bilateral knees flexed in stance, and downward gaze - pt with poor carry over with cues for upright posture/gaze. Transported to dayroom in preparation for [facility] evaluation with  Ace Holder.  Treatment Session 2 Received pt sitting in WC in dayroom. Pt seen for WC evaluation with Ace Holder from [employer] with primary OT also present. Discussed getting [phone] hybrid TIS with ability to self-propel [facility] FT) with the following features: -tension adjustable backrest -elevating legrests -heel loop to secure feet on footplates -plastic coated handgrip and brake extenders -full armrests  -low maintenance cushion with incontinence protecting liner  Transported to/from room in [facility] dependently for time management purposes. Pt stood from [facility] with RW and mod A (cues for hand placement) and performed simulated car transfer with RW and min A to pivot. Pt able to get BLEs in/out of car without assist. Returned to room and concluded session with pt sitting in WC, needs within reach, and seatbelt alarm on.   Therapy Documentation Precautions:  Precautions Precautions: Fall Precaution/Restrictions Comments: dementia Restrictions Weight Bearing Restrictions Per Provider Order: No  Therapy/Group: Individual Therapy [name] Nena Bank PT, DPT 07/16/2023, 6:57 AM

## 2023-07-16 NOTE — Progress Notes (Addendum)
 Patient ID: [name], female   DOB: 02-Jan-1946, 78 y.o.   MRN: [id number]  HPI:  Pt is a 78 y.o.  with  hypertension, renal vein thrombosis on Eliquis , stroke, type II DM, lupus, CKD 4-5 who was recently admitted for CHF exacerbation and progressive CKD presented back with worsening dyspnea on exertion and fluid overload.    Assessment/Plan:   # Progressive CKD 5 versus AKI on CKD,: With frequent hospitalization for fluid overload.  Responding to IV diuretics but not po and with worsening renal function d/w patient and family (Daughter on phone 4/2) at length recommend starting dialysis. S/p LUE AVG + TDC placement 4/4 -> AVG removed bec of steal -Continue dialysis on TTS schedule orders written for today; will switch to MWF next week to facilitate dc.  - CLIP complete-  has been accepted at [facility]   # CKD-MBD/hyperphosphatemia: Increased sevelamer  on 4/21 to 2 TIDM. PTH at goal. Repeat on 5/1   #Anemia of CKD: Concern about GI bleed, underwent endoscopy t recently with no sign of bleeding.  Back on Eliquis .  Has required transfusion.  Given IV iron  and ESA   # Hypertension/volume: [name] improved after diuresis.  Torsemide  on non-dialysis days   #A fib: on eliquis    # Vascular access - developed steal syndrome of left hand. - VVS following s/p excision of AVG 07/01/23 due to concern for steal syndrome with improvement of symptoms and ok to resume anticoagulation.    #Disposition -now in CIR  S: Feels well today. May have knee aspiration and/or injection.  Denies fever, chills, nausea, vomiting.  Appetite decent.  O:BP (!) 137/58 (BP Location: Right Arm)   Pulse 69   Temp 97.9 F (36.6 C)   Resp 17   Ht 5\' 4"  (1.626 m)   Wt 71.7 kg   SpO2 97%   BMI 27.12 kg/m   Intake/Output Summary (Last 24 hours) at 07/16/2023 0730 Last data filed at 07/15/2023 1832 Gross per 24 hour  Intake 480 ml  Output --  Net 480 ml   Intake/Output: I/O last 3 completed shifts: In: 480  [P.O.:480] Out: -   Intake/Output this shift:  No intake/output data recorded. Weight change: 1.768 kg Gen: NAD lying in bed CVS: Normal rate, no rub Resp: Bilateral chest rise with no increased work of breathing Abd: +BS, soft, NT/ND Ext: no edema, warm and well-perfused Access: Right IJ TC  Recent Labs  Lab 07/13/23 2115  NA 133*  K 3.7  CL 93*  CO2 20*  GLUCOSE 238*  BUN 95*  CREATININE 8.20*  ALBUMIN  2.0*  CALCIUM  9.5  AST 47*  ALT 48*   Liver Function Tests: Recent Labs  Lab 07/13/23 2115  AST 47*  ALT 48*  ALKPHOS 222*  BILITOT 0.7  PROT 6.8  ALBUMIN  2.0*   No results for input(s): "LIPASE", "AMYLASE" in the last 168 hours. No results for input(s): "AMMONIA" in the last 168 hours. CBC: Recent Labs  Lab 07/10/23 0545 07/13/23 2115 07/14/23 1057 07/15/23 0849  WBC 11.9* 19.7* 20.4* 15.9*  NEUTROABS 9.2* 17.4* 17.7* 13.7*  HGB 8.2* 7.9* 8.4* 7.8*  HCT 26.1* 25.2* 27.0* 25.3*  MCV 92.2 93.7 92.2 93.0  PLT 357 476* 465* 451*   Cardiac Enzymes: No results for input(s): "CKTOTAL", "CKMB", "CKMBINDEX", "TROPONINI" in the last 168 hours. CBG: Recent Labs  Lab 07/09/23 1213 07/09/23 2039 07/10/23 2048 07/14/23 0100  GLUCAP 165* 161* 157* 137*    Iron  Studies: No results  for input(s): "IRON ", "TIBC", "TRANSFERRIN", "FERRITIN" in the last 72 hours. Studies/Results: DG CHEST PORT 1 VIEW Result Date: 07/14/2023 CLINICAL DATA:  Leukocytosis, CHF EXAM: PORTABLE CHEST 1 VIEW COMPARISON:  07/02/2023 FINDINGS: Single frontal view of the chest demonstrates stable right internal jugular central venous catheter. Cardiac silhouette is unremarkable. No acute airspace disease, effusion, or pneumothorax. No acute bony abnormalities. IMPRESSION: 1. No acute intrathoracic process. Electronically Signed   By: Bobbye Burrow M.D.   On: 07/14/2023 17:13     apixaban   5 mg Oral BID   B-complex with vitamin C  1 tablet Oral Daily   bupivacaine (PF)  10 mL Infiltration  Once   bupivacaine (PF)  10 mL Infiltration Once   Chlorhexidine  Gluconate Cloth  6 each Topical Q12H   cholecalciferol   1,000 Units Oral Daily   colestipol   1 g Oral TID AC   darbepoetin (ARANESP ) injection - DIALYSIS  200 mcg Subcutaneous Q Sat-1800   diclofenac  Sodium  2 g Topical QID   FLUoxetine   20 mg Oral Daily   gabapentin   300 mg Oral QHS   l-methylfolate-B6-B12  1 tablet Oral Daily   lipase/protease/amylase  36,000 Units Oral TID AC   mirtazapine   7.5 mg Oral QHS   multivitamin  1 tablet Oral QHS   pantoprazole   40 mg Oral BID   rivastigmine   1.5 mg Oral BID   sevelamer  carbonate  1,600 mg Oral TID WC   torsemide   100 mg Oral Q M,W,F   And   torsemide   100 mg Oral Q Sun    BMET    Component Value Date/Time   NA 133 (L) 07/13/2023 2115   NA 144 07/19/2020 1458   NA 140 03/14/2017 0810   K 3.7 07/13/2023 2115   K 3.8 03/14/2017 0810   CL 93 (L) 07/13/2023 2115   CO2 20 (L) 07/13/2023 2115   CO2 27 03/14/2017 0810   GLUCOSE 238 (H) 07/13/2023 2115   GLUCOSE 177 (H) 03/14/2017 0810   BUN 95 (H) 07/13/2023 2115   BUN 85 (HH) 07/19/2020 1458   BUN 35.6 (H) 03/14/2017 0810   CREATININE 8.20 (H) 07/13/2023 2115   CREATININE 2.31 (H) 10/25/2022 1355   CREATININE 1.8 (H) 03/14/2017 0810   CALCIUM  9.5 07/13/2023 2115   CALCIUM  8.3 (L) 06/18/2023 0334   CALCIUM  9.6 03/14/2017 0810   GFRNONAA 5 (L) 07/13/2023 2115   GFRNONAA 22 (L) 10/25/2022 1355   GFRNONAA 45 (L) 12/17/2013 1158   GFRAA 21 (L) 12/21/2019 0845   GFRAA 52 (L) 12/17/2013 1158   CBC    Component Value Date/Time   WBC 15.9 (H) 07/15/2023 0849   RBC 2.72 (L) 07/15/2023 0849   HGB 7.8 (L) 07/15/2023 0849   HGB 9.4 (L) 04/18/2023 1434   HGB 9.3 (L) 03/14/2017 0810   HCT 25.3 (L) 07/15/2023 0849   HCT 26.4 (L) 07/21/2019 0809   HCT 29.2 (L) 03/14/2017 0810   PLT 451 (H) 07/15/2023 0849   PLT 235 04/18/2023 1434   PLT 292 03/14/2017 0810   PLT 351 10/11/2016 1139   MCV 93.0 07/15/2023 0849   MCV  81.3 03/14/2017 0810   MCH 28.7 07/15/2023 0849   MCHC 30.8 07/15/2023 0849   RDW 16.9 (H) 07/15/2023 0849   RDW 14.0 03/14/2017 0810   LYMPHSABS 1.0 07/15/2023 0849   LYMPHSABS 1.4 03/14/2017 0810   MONOABS 0.9 07/15/2023 0849   MONOABS 0.4 03/14/2017 0810   EOSABS 0.1 07/15/2023 0849   EOSABS 0.1 03/14/2017  0810   BASOSABS 0.0 07/15/2023 0849   BASOSABS 0.0 03/14/2017 0810

## 2023-07-16 NOTE — Plan of Care (Incomplete)
  Problem: RH BOWEL ELIMINATION Goal: RH STG MANAGE BOWEL WITH ASSISTANCE Description: STG Manage Bowel with mod I Assistance. Outcome: Progressing   Problem: RH SAFETY Goal: RH STG ADHERE TO SAFETY PRECAUTIONS W/ASSISTANCE/DEVICE Description: STG Adhere to Safety Precautions With cues Assistance/Device. Outcome: Progressing   Problem: RH PAIN MANAGEMENT Goal: RH STG PAIN MANAGED AT OR BELOW PT'S PAIN GOAL Description: < 4 with prns Outcome: Progressing   Problem: Fluid Volume: Goal: Compliance with measures to maintain balanced fluid volume will improve Description: Patient and family will be able to manage care at discharge using educational resources for 1200 cc fluid restriction/ dietary modification recommendations independently Outcome: Progressing

## 2023-07-16 NOTE — Progress Notes (Signed)
 Occupational Therapy Session Note  Patient Details  Name: [name] MRN: [id number] Date of Birth: 1945-06-17  Today's Date: 07/16/2023 OT Individual Time: 4098-1191  OT Individual Time Calculation (min): 40 min & OT individual Time: 1115-1130 OT individual Time Calculation (min): 15 min Total treatment time with PT (unbilled): 30 min   Short Term Goals: Week 2:  OT Short Term Goal 1 (Week 2): Pt will complete sit > stand in prep for ADL with CGA using LRAD OT Short Term Goal 2 (Week 2): Pt will complete toilet transfer with CGA using LRAD OT Short Term Goal 3 (Week 2): Pt will follow 1 step commands to complete LB dressing with min A  Skilled Therapeutic Interventions/Progress Updates:  Session 1 Skilled OT intervention completed with focus on ADL retraining, functional sit > stands and ambulatory endurance. Pt received upright in bed, agreeable to session. Unrated soreness vs pain reported in both knees following aspiration procedure yesterday; pre-medicated. OT offered rest breaks, repositioning throughout for pain reduction.  Of note- pt did much better functionally with ADLs and attempting tasks herself this AM with fewer cues needed. Transitioned to EOB with supervision. R knee sleeve already in place. Able to sit EOB with supervision statically, and CGA dynamically when anteriorly leaning. Doffed shirt with supervision, donned bra with min A only for fastening first fastener but pt able to manage all others and donning over shoulders (normally max A)! Applied deo set up A, donned shirt with supervision and increased time due to LUE shoulder ROM limitations.   Pt stood using RW with CGA from elevated bed with cues for hip extension, then lowered pants with CGA for balance only. Pt able to doff over feet, thread new ones using anterior leans and figure 4 position. Light min A sit > stand with RW with cues for feet being underneath her and hip extension once erect then min A for  dynamic balance while pt donned pants over hips. Donned slip on shoes mod A. Min A sit > stand and CGA/min A stand pivot with RW with cues for stepping back to w/c prior to premature sitting.  Set up A for oral care at sink. Pt denying dizziness this morning with mobility. To promote functional ambulatory endurance needed for home management, pt stood with light min A using RW, then ambulated 14 ft in hallway with min A and 2nd person used for w/c follow. Pt required cues for stride length and weight shifting especially when loading more painful RLE.   Pt remained seated in w/c, with belt alarm on/activated, and with all needs in reach at end of session.  Session 2 Skilled OT intervention completed with focus on specialty w/c evaluation with [employer] representative present as well as primary PT. Pt received seated in w/c in gym. No pain reported.  OT made the following recommendations to promote pt safety and independence at DC: -Discussed getting [phone] hybrid TIS with ability to self-propel  -Rims with increased stick-coating to promote increased grip for self-propulsion 2/2 grip limitations -Standard length arm rests for BUE comfort/positioning -Bilateral brake extenders -W/c tension adjustable back vs contour/head rest -BLE elevating leg rests -Adjustment of wheels to be more forward for ease of use with BUE for propulsion  Pt remained seated in w/c with direct care handoff to PT at end of session.   Therapy Documentation Precautions:  Precautions Precautions: Fall Precaution/Restrictions Comments: dementia Restrictions Weight Bearing Restrictions Per Provider Order: No   Therapy/Group: Individual Therapy  Vinal Rosengrant Thora Flint,  MS, OTR/L  07/16/2023, 12:09 PM

## 2023-07-16 NOTE — Progress Notes (Signed)
 Notified by Dialysis nurse Lyell Samuel, RN) that patient did not receive Vancomycin  there , to be given tonight

## 2023-07-16 NOTE — Progress Notes (Signed)
 Patient ID: [name], female   DOB: 1945-06-23, 78 y.o.   MRN: [id number]  Reviewed knee aspirates from yesterday. Given WBC disclaimer and low volume of fluid do not believe the right knee is septic and do not think the WBC count is accurate. Recommend medical treatment for gout. Will continue to follow clinically at this time.    Georganna Kin, PA-C Orthopedic Surgery [phone]

## 2023-07-16 NOTE — Progress Notes (Signed)
 Physical Therapy Session Note  Patient Details  Name: [name] MRN: [id number] Date of Birth: 04/11/1945  Today's Date: 07/16/2023 PT Individual Time: 0902-0927 PT Individual Time Calculation (min): 25 min   Short Term Goals: Week 1:  PT Short Term Goal 1 (Week 1): pt will transfer bed<>chair with LRAD and CGA PT Short Term Goal 2 (Week 1): pt will transfer sit<>stand with LRAD and CGA PT Short Term Goal 3 (Week 1): pt will ambulate 42ft with LRAD and CGA  Skilled Therapeutic Interventions/Progress Updates:      Pt sitting up in wheelchair to start - agreeable to thearpy treatment. Reports unrated R knee pain but states that the knee arthrocentesis yesterday has helped.   Transported to main gym and wheeled inside // bars. Focused session on gait training in // bars to progress functional mobility and independence. Sit<>stand to // bars with minA with patient pulling to stand. Patient standing posture is flexed at the hips, knees, and trunk with heavy reliance on BUE support for balance and stability. She ambulates in this same posture as well. She ambulates 3x18ft with seated rest breaks for recovery. MinA overall with w/c follow for safety. Pt takes short steps bilaterally and needs cues to advance her hands along // bars to keep them in front of her. Distance limited by fatigue and knee pain.   Returned to her room and patient was left sitting up in wheelchair with NT changing bed linen.   Therapy Documentation Precautions:  Precautions Precautions: Fall Precaution/Restrictions Comments: dementia Restrictions Weight Bearing Restrictions Per Provider Order: No General:    Therapy/Group: Individual Therapy  Pheobe Brass 07/16/2023, 7:55 AM

## 2023-07-16 NOTE — Progress Notes (Signed)
 Pt's case discussed with nephrologist. Pt is currently on a TTS inpt schedule and will be d/c next Tuesday. Inquired of nephrologist if pt could possibly receive HD on Monday instead of Tuesday (next week) to avoid HD on day of d/c. Pt can start at [facility] on Thursday. Nephrologist agreeable to consider. Update provided to CSW as well. Will assist as needed.   Lauraine Polite Renal Navigator [phone]

## 2023-07-16 NOTE — Consult Note (Signed)
 [facility]  Total days of antibiotics 14               Reason for Consult: septic arthritis of knees   Referring Physician: Alessandra Ancona  Principal Problem:   Debility    HPI: [name] is a 78 y.o. female hx of breast ca, ESRD on HD, T2DM, diastolic heart failure, SLE, who recently hospitalized from 3/29- 4/21 for heart failure. During hospitalization she had LUE AV graft placed on 4/4 but appeared to have developed cellulitis about graft infection and also steal syndrome. She had the AV graft excised on 07/01/23.  After excision she started to have fever, and leukocytosis with mri of left arm showing small developing abscesses on 4/20. She has been on vancomycin  since 4/15 and intermittent gram negative coverage through 4/29 for the original plan. She was then admitted for CIR on 4/21  was starting to notice knee pain bilaterally but no fevers. And noted to have leukocytosis from 12K to 20K on 4/27. Ortho evaluated patient and initially did left knee aspiration of effusion and also right knee aspiration ( but very little fluid removed.) patient reports that she feels better since the procedure  Cell count of left knee -- showed 30K with 95%N plus intracellular monosodium urate crystals c/w gouty flare.  Cell count of right knee - showed 89,700 WBC with 92%N, plus intracellular monosodium urate crystals  No fluid taken to culture. She has been on ceftriaxone  2gm iv daily for hte past 2 days and on vancomycin  with her WBC down to 15K  Past Medical History:  Diagnosis Date   Abdominal discomfort    Chronic N/V/D. Presumptive dx Crohn's dx per elevated p ANCA. Failed Entocort and Pentasa. Sep 2003 - ileocolectomy c anastomosis per Dr Duwaine Gins 2/2 adhesions - path was hegative for Crohns. EGD, Sm bowel follow through (11/03), and an eteroclysis (10/03) were unrevealing. Cuases hypomag and hypocalcemia.   Adnexal mass 10/2001   s/p lap BSO (R ovarian fibroma) &  lysis of adhesions   Allergy    Seasonal   Anemia    Multifactorial. Baseline HgB 10-11 ish. B12 def - 150 in 3/10. Fe Def - ferritin 35 3/10. Both are being repleted.   Breast cancer (HCC) 03/16/2013   right, 5 o'clock   CAD (coronary artery disease) 1996   1996 - PTCA and angioplasty diagonal branch. 2000 - Rotoblator & angiopllasty of diagonal. 2006 - subendocardial AMI, DES to proximal LAD.Aaron Aas Also had 90% stenosis in distal apical LAD. EF 55 with apical hypokinesis. Indefinite ASA and Plavix .   CHF (congestive heart failure) (HCC)    Chronic kidney disease, stage 4 (severe) (HCC)    Chronic pain    CT 10/10 = Spinal stenosis L2 - S1.   COVID-19 11/02/2020   Diabetes mellitus    Insulin  dependent   Diabetes mellitus type 2 in obese 05/03/2010   Managed on lantus  and novolog . Has diabetic nephropathy. Metformin D/C'd 2012 2/2 creatinine.  No diabetic retinopathy per 3/11.    Gout    Hx of radiation therapy 06/02/13- 07/16/13   right rbeast 4500 cGy 25 sessions, right breast boost 1600 cGy in 8 sessions   Hyperlipidemia    Managed with both a statin and Welchol . Welchol  stopped 2014 2/2 cost and started on fenofibrate     Hypertension    2006 B renal arteries patent. 2003 MRA - no RAS. 2003 pheo W/U Dr Francetta Innocent reportedly negative.   Hypoxia 07/23/2017  Lupus    Lymphedema of breast    Personal history of radiation therapy 2015   Pneumonia of left lung due to infectious organism    RBBB    Renal vein thrombosis (HCC)    SBO (small bowel obstruction) (HCC) 09/17/2017   Secondary hyperparathyroidism (HCC)    Stroke (HCC)    Incidental finding MRI 2002 L lacunar infarct   Vitamin B12 deficiency    Vitamin D  deficiency    Wears dentures    top    Allergies:  Allergies  Allergen Reactions   Anesthetics, Halogenated Other (See Comments)    Per patient's daughter, she can not tolerate anesthetics.    Etomidate  Other (See Comments)    Heart stopped during anesthesia   Fentanyl  Other  (See Comments)    Heart stopped during anesthesia    Percocet [Oxycodone -Acetaminophen ] Other (See Comments)    Headaches    Diazepam Other (See Comments)    Agitation   Lorazepam  Nausea Only   Tramadol  Hcl Nausea And Vomiting   Haloperidol Nausea And Vomiting    **Haldol**   Morphine  Sulfate Nausea Only    agitation   Propoxyphene Nausea And Vomiting    **Darvocet** no longer available   Versed  [Midazolam ] Other (See Comments)    Heart stopped during anesthesia     MEDICATIONS:  apixaban   5 mg Oral BID   B-complex with vitamin C  1 tablet Oral Daily   bupivacaine (PF)  10 mL Infiltration Once   bupivacaine (PF)  10 mL Infiltration Once   Chlorhexidine  Gluconate Cloth  6 each Topical Q12H   cholecalciferol   1,000 Units Oral Daily   colchicine   0.6 mg Oral Once   colestipol   1 g Oral TID AC   darbepoetin (ARANESP ) injection - DIALYSIS  200 mcg Subcutaneous Q Sat-1800   diclofenac  Sodium  2 g Topical QID   FLUoxetine   20 mg Oral Daily   gabapentin   300 mg Oral QHS   heparin  sodium (porcine)  3,200 Units Intracatheter Once   l-methylfolate-B6-B12  1 tablet Oral Daily   lipase/protease/amylase  36,000 Units Oral TID AC   mirtazapine   7.5 mg Oral QHS   multivitamin  1 tablet Oral QHS   pantoprazole   40 mg Oral BID   rivastigmine   1.5 mg Oral BID   sevelamer  carbonate  1,600 mg Oral TID WC   torsemide   100 mg Oral Q M,W,F   And   torsemide   100 mg Oral Q Sun    Social History   Tobacco Use   Smoking status: Never   Smokeless tobacco: Never  Vaping Use   Vaping status: Never Used  Substance Use Topics   Alcohol use: No    Alcohol/week: 0.0 standard drinks of alcohol   Drug use: No    Family History  Problem Relation Age of Onset   Breast cancer Mother 59   Pancreatic cancer Brother 42   Lung cancer Maternal Aunt    Breast cancer Maternal Aunt    Kidney failure Maternal Aunt    Prostate cancer Maternal Uncle    Heart attack Maternal Grandmother    Breast  cancer Maternal Grandmother    Colon cancer Maternal Grandmother    Clotting disorder Maternal Grandmother    Diabetes Maternal Grandmother    Hypertension Daughter    Pancreatic cancer Other     Review of Systems -    OBJECTIVE: Temp:  [97.9 F (36.6 C)-98.2 F (36.8 C)] 98.1 F (36.7 C) (04/29 1342) Pulse  Rate:  [69-81] 80 (04/29 1415) Resp:  [10-17] 10 (04/29 1415) BP: (93-143)/(43-64) 143/64 (04/29 1415) SpO2:  [95 %-100 %] 97 % (04/29 1415) Weight:  [71.7 kg-74.4 kg] 74.4 kg (04/29 1342)   LABS: Results for orders placed or performed during the hospital encounter of 07/08/23 (from the past 48 hours)  CBC with Differential/Platelet     Status: Abnormal   Collection Time: 07/15/23  8:49 AM  Result Value Ref Range   WBC 15.9 (H) 4.0 - 10.5 K/uL   RBC 2.72 (L) 3.87 - 5.11 MIL/uL   Hemoglobin 7.8 (L) 12.0 - 15.0 g/dL   HCT 19.1 (L) 47.8 - 29.5 %   MCV 93.0 80.0 - 100.0 fL   MCH 28.7 26.0 - 34.0 pg   MCHC 30.8 30.0 - 36.0 g/dL   RDW 62.1 (H) 30.8 - 65.7 %   Platelets 451 (H) 150 - 400 K/uL   nRBC 0.0 0.0 - 0.2 %   Neutrophils Relative % 86 %   Neutro Abs 13.7 (H) 1.7 - 7.7 K/uL   Lymphocytes Relative 6 %   Lymphs Abs 1.0 0.7 - 4.0 K/uL   Monocytes Relative 5 %   Monocytes Absolute 0.9 0.1 - 1.0 K/uL   Eosinophils Relative 1 %   Eosinophils Absolute 0.1 0.0 - 0.5 K/uL   Basophils Relative 0 %   Basophils Absolute 0.0 0.0 - 0.1 K/uL   Immature Granulocytes 2 %   Abs Immature Granulocytes 0.28 (H) 0.00 - 0.07 K/uL    Comment: Performed at [facility], [address], [place], [place] [address]  Vancomycin , random     Status: None   Collection Time: 07/15/23  8:49 AM  Result Value Ref Range   Vancomycin  Rm 26 ug/mL    Comment:        Random Vancomycin  therapeutic range is dependent on dosage and time of specimen collection. A peak range is 20-40 ug/mL A trough range is 5-15 ug/mL        Performed at [facility], [address] [place],  [place] [address]   Urinalysis, w/ Reflex to Culture (Infection Suspected) -Urine, Clean Catch     Status: Abnormal   Collection Time: 07/15/23  9:29 AM  Result Value Ref Range   Specimen Source URINE, CLEAN CATCH    Color, Urine AMBER (A) YELLOW    Comment: BIOCHEMICALS MAY BE AFFECTED BY COLOR   APPearance CLOUDY (A) CLEAR   Specific Gravity, Urine 1.018 1.005 - 1.030   pH 5.0 5.0 - 8.0   Glucose, UA NEGATIVE NEGATIVE mg/dL   Hgb urine dipstick NEGATIVE NEGATIVE   Bilirubin Urine NEGATIVE NEGATIVE   Ketones, ur NEGATIVE NEGATIVE mg/dL   Protein, ur >=413 (A) NEGATIVE mg/dL   Nitrite NEGATIVE NEGATIVE   Leukocytes,Ua MODERATE (A) NEGATIVE   RBC / HPF 6-10 0 - 5 RBC/hpf   WBC, UA 21-50 0 - 5 WBC/hpf    Comment:        Reflex urine culture not performed if WBC <=10, OR if Squamous epithelial cells >5. If Squamous epithelial cells >5 suggest recollection.    Bacteria, UA RARE (A) NONE SEEN   Squamous Epithelial / HPF 21-50 0 - 5 /HPF   Budding Yeast PRESENT    Ca Oxalate Crys, UA PRESENT     Comment: Performed at [facility], [address], [place], [place] [address]  Hepatitis B surface antigen     Status: None   Collection  Time: 07/15/23 11:36 AM  Result Value Ref Range   Hepatitis B Surface Ag NON REACTIVE NON REACTIVE    Comment: Performed at [facility], [address], [place], [place] [address]  Hepatitis B surface antibody,quantitative     Status: Abnormal   Collection Time: 07/15/23 11:36 AM  Result Value Ref Range   Hep B S AB Quant (Post) <3.5 (L) Immunity>10 mIU/mL    Comment: (NOTE)  Status of Immunity                     Anti-HBs Level  ------------------                     -------------- Inconsistent with Immunity                  0.0 - 10.0 Consistent with Immunity                         >10.0 Performed At: [facility] [address] [place], [place] [id number] Pearlean Botts MD OZ:[id number]   Synovial cell count + diff, w/ crystals      Status: Abnormal   Collection Time: 07/15/23 12:02 PM  Result Value Ref Range   Color, Synovial STRAW (A) YELLOW   Appearance-Synovial TURBID (A) CLEAR   Crystals, Fluid INTRACELLULAR MONOSODIUM URATE CRYSTALS     Comment: EXTRACELLULAR MONOSODIUM URATE CRYSTALS   WBC, Synovial 30,000 (H) 0 - 200 /cu mm   Neutrophil, Synovial 95 (H) 0 - 25 %   Lymphocytes-Synovial Fld 1 0 - 20 %   Monocyte-Macrophage-Synovial Fluid 4 (L) 50 - 90 %   Eosinophils-Synovial 0 0 - 1 %    Comment: Performed at [facility], [address], [place], [place] [address]  Synovial cell count + diff, w/ crystals     Status: Abnormal   Collection Time: 07/15/23  3:59 PM  Result Value Ref Range   Color, Synovial YELLOW YELLOW   Appearance-Synovial TURBID (A) CLEAR   Crystals, Fluid INTRACELLULAR MONOSODIUM URATE CRYSTALS     Comment: INTRACELLULAR CALCIUM  PYROPHOSPHATE CRYSTALS   WBC, Synovial 89,750 (H) 0 - 200 /cu mm    Comment: COUNT MAY BE INACCURATE DUE TO FIBRIN CLUMPS.   Neutrophil, Synovial 92 (H) 0 - 25 %   Lymphocytes-Synovial Fld 1 0 - 20 %   Monocyte-Macrophage-Synovial Fluid 7 (L) 50 - 90 %   Eosinophils-Synovial 0 0 - 1 %    Comment: Performed at [facility], [address], [place], [place] [address]  CBC with Differential/Platelet     Status: Abnormal   Collection Time: 07/16/23 10:06 AM  Result Value Ref Range   WBC 12.6 (H) 4.0 - 10.5 K/uL   RBC 2.90 (L) 3.87 - 5.11 MIL/uL   Hemoglobin 8.3 (L) 12.0 - 15.0 g/dL   HCT 44.0 (L) 10.2 - 72.5 %   MCV 91.7 80.0 - 100.0 fL   MCH 28.6 26.0 - 34.0 pg   MCHC 31.2 30.0 - 36.0 g/dL   RDW 36.6 (H) 44.0 - 34.7 %   Platelets 474 (H) 150 - 400 K/uL   nRBC 0.2 0.0 - 0.2 %   Neutrophils Relative % 83 %   Neutro Abs 10.5 (H) 1.7 - 7.7 K/uL   Lymphocytes Relative 8 %   Lymphs Abs 1.0 0.7 - 4.0 K/uL   Monocytes Relative 5 %   Monocytes Absolute 0.7 0.1 -  1.0 K/uL   Eosinophils Relative 1 %   Eosinophils Absolute 0.1 0.0 - 0.5 K/uL    Basophils Relative 0 %   Basophils Absolute 0.1 0.0 - 0.1 K/uL   Immature Granulocytes 3 %   Abs Immature Granulocytes 0.32 (H) 0.00 - 0.07 K/uL    Comment: Performed at [facility], [address] [place], [place] [address]  Procalcitonin     Status: None   Collection Time: 07/16/23 10:06 AM  Result Value Ref Range   Procalcitonin 6.26 ng/mL    Comment:        Interpretation: PCT > 2 ng/mL: Systemic infection (sepsis) is likely, unless other causes are known. (NOTE)       Sepsis PCT Algorithm           Lower Respiratory Tract                                      Infection PCT Algorithm    ----------------------------     ----------------------------         PCT < 0.25 ng/mL                PCT < 0.10 ng/mL          Strongly encourage             Strongly discourage   discontinuation of antibiotics    initiation of antibiotics    ----------------------------     -----------------------------       PCT 0.25 - 0.50 ng/mL            PCT 0.10 - 0.25 ng/mL               OR       >80% decrease in PCT            Discourage initiation of                                            antibiotics      Encourage discontinuation           of antibiotics    ----------------------------     -----------------------------         PCT >= 0.50 ng/mL              PCT 0.26 - 0.50 ng/mL               AND       <80% decrease in PCT              Encourage initiation of                                             antibiotics       Encourage continuation           of antibiotics    ----------------------------     -----------------------------        PCT >= 0.50 ng/mL                  PCT > 0.50 ng/mL               AND  increase in PCT                  Strongly encourage                                      initiation of antibiotics    Strongly encourage escalation           of antibiotics                                     -----------------------------                                            PCT <= 0.25 ng/mL                                                 OR                                        > 80% decrease in PCT                                      Discontinue / Do not initiate                                             antibiotics  Performed at [facility], [address] [place], [place] [address]    *Note: Due to a large number of results and/or encounters for the requested time period, some results have not been displayed. A complete set of results can be found in Results Review.    MICRO:  IMAGING: No results found.  HISTORICAL MICRO/IMAGING  IMPRESSION: 1. There is heterogeneous T2 and decreased T1 signal seen just medial to the distal volar aspect of the biceps muscle, just medial/ulnar/volar to the median antecubital vein and the median nerve. Question intervention in this region such as IV placement, needlestick, or other, recommend clinical correlation. Extending laterally/radially from this region of possibly inflammatory phlegmon is a small curvilinear region of fluid measuring up to approximately 1.8 x 0.5 x 1.5 cm, just anterior to the muscle at the biceps musculotendinous junction. This may represent a small developing abscess. 2. Mild-to-moderate elbow joint effusion. No significant synovial thickening. MRI cannot determine the presence or absence of infection in this joint fluid. 3. Mild-to-moderate common flexor tendinosis. 4. Mild common extensor tendinosis.   Assessment/Plan:  78yo F with history of ESRD being treated for deep tissue abscess to arm associated with her prior AV graft developed bilateral knee pain. Aspiration of both knees suggests that she is having pseudogout flare, however the right knee is showing high cell count of nearly 90K which is concernin for septic arthritis. Ortho still feels this is pseudogout since she did not have significant effusion to  her right knee.  - we will ask micro to set up  cultures on synovial fluid - keep on vancomycin  plus ceftriaxone  for the time being, and wait for next 72hr until culture results finalize. - agree that having septic arthritis with pseudogout flare is rare but her synovial fluid counts, suggests higher inflammation  In terms of left arm abscess associated with av fistula = she has been on abtx for 2 wk since avg graft removal. Will have additional few days as we troubleshoot /rule out septic arthritis  Will also follow up on blood cx  I have personally spent 80 minutes involved in face-to-face and non-face-to-face activities for this patient on the day of the visit. Professional time spent includes the following activities: Preparing to see the patient (review of tests), Obtaining and/or reviewing separately obtained history (admission/discharge record), Performing a medically appropriate examination and/or evaluation , Ordering medications/tests/procedures, referring and communicating with other health care professionals, Documenting clinical information in the EMR, Independently interpreting results (not separately reported), Communicating results to the patient/family/caregiver, Counseling and educating the patient/family/caregiver and Care coordination (not separately reported).

## 2023-07-16 NOTE — Progress Notes (Signed)
 PROGRESS NOTE   Subjective/Complaints: Patient feels that her bilateral knee pain has improved with aspiration- has 89,750 WBC in right knee, will consult ID, CBC and procal ordered  ROS: +upper and lower extremity weakness, +bowel incontinence, +right knee pain-limits therapy. Denies CP, SOB, abd pain, n/v/d/c   Objective:   DG CHEST PORT 1 VIEW Result Date: 07/14/2023 CLINICAL DATA:  Leukocytosis, CHF EXAM: PORTABLE CHEST 1 VIEW COMPARISON:  07/02/2023 FINDINGS: Single frontal view of the chest demonstrates stable right internal jugular central venous catheter. Cardiac silhouette is unremarkable. No acute airspace disease, effusion, or pneumothorax. No acute bony abnormalities. IMPRESSION: 1. No acute intrathoracic process. Electronically Signed   By: Bobbye Burrow M.D.   On: 07/14/2023 17:13    Recent Labs    07/14/23 1057 07/15/23 0849  WBC 20.4* 15.9*  HGB 8.4* 7.8*  HCT 27.0* 25.3*  PLT 465* 451*    Recent Labs    07/13/23 2115  NA 133*  K 3.7  CL 93*  CO2 20*  GLUCOSE 238*  BUN 95*  CREATININE 8.20*  CALCIUM  9.5     Intake/Output Summary (Last 24 hours) at 07/16/2023 0925 Last data filed at 07/15/2023 1832 Gross per 24 hour  Intake 240 ml  Output --  Net 240 ml        Physical Exam: Vital Signs Blood pressure (!) 137/58, pulse 69, temperature 97.9 F (36.6 C), resp. rate 17, height 5\' 4"  (1.626 m), weight 71.7 kg, SpO2 97%.  Gen: no distress, normal appearing, resting in bed.  HEENT: oral mucosa pink and moist, NCAT Cardio: Reg rate, no m/r/g appreciated Chest: normal effort, normal rate of breathing, CTAB Abd: soft, non-distended, +BS throughout, nonTTP Ext/MsK: no edema peripherally, LUE with graft site at L elbow with very faint erythema and slight warmth noted, no red streaking. Seems better than previously described. R knee tender, with effusion, but no erythema or significant warmth.   Psychiatric:     Comments: Pt flat, cooperative   PRIOR EXAMS: Musculoskeletal:        General: Swelling and tenderness (left antecubital region) present.  Right knee swollen and tender    Cervical back: Normal range of motion.  Skin:    Comments: AVF site CDI. No erythema. Right heel boggy  Neurological:     Mental Status: She is alert.     Comments: Oriented to self only. Unable to recall age, DOB or place. Poor recall and decrease insight into deficits. She was able to follow simple motor commands. No CN deficits. Limited RUE d/t HD catheter. LUE limited d/t pain. Grip/wrist at least 3-4/5 bilaterally. BLE 3- to 3/5 HF, KE and 4/5 ADF/PF. Decreased LT from ankles down bilaterally. No abnl resting tone. DTR's tr to 1+, stable 4/29     Assessment/Plan: 1. Functional deficits which require 3+ hours per day of interdisciplinary therapy in a comprehensive inpatient rehab setting. Physiatrist is providing close team supervision and 24 hour management of active medical problems listed below. Physiatrist and rehab team continue to assess barriers to discharge/monitor patient progress toward functional and medical goals  Care Tool:  Bathing    Body parts bathed by patient: Right arm, Left  arm, Chest, Abdomen, Front perineal area, Right upper leg, Left upper leg, Right lower leg, Left lower leg, Face   Body parts bathed by helper: Buttocks     Bathing assist Assist Level: Minimal Assistance - Patient > 75%     Upper Body Dressing/Undressing Upper body dressing   What is the patient wearing?: Pull over shirt, Bra    Upper body assist Assist Level: Supervision/Verbal cueing    Lower Body Dressing/Undressing Lower body dressing      What is the patient wearing?: Pants     Lower body assist Assist for lower body dressing: Contact Guard/Touching assist     Toileting Toileting    Toileting assist Assist for toileting: Moderate Assistance - Patient 50 - 74%      Transfers Chair/bed transfer  Transfers assist     Chair/bed transfer assist level: Moderate Assistance - Patient 50 - 74%     Locomotion Ambulation   Ambulation assist      Assist level: 2 helpers Assistive device: Walker-rolling Max distance: 14 ft   Walk 10 feet activity   Assist  Walk 10 feet activity did not occur: Safety/medical concerns (pain)  Assist level: 2 helpers Assistive device: Walker-rolling   Walk 50 feet activity   Assist Walk 50 feet with 2 turns activity did not occur: Safety/medical concerns (pain)         Walk 150 feet activity   Assist Walk 150 feet activity did not occur: Safety/medical concerns (pain)         Walk 10 feet on uneven surface  activity   Assist Walk 10 feet on uneven surfaces activity did not occur: Safety/medical concerns (pain)         Wheelchair     Assist Is the patient using a wheelchair?: Yes Type of Wheelchair: Manual (lightweight)    Wheelchair assist level: Supervision/Verbal cueing Max wheelchair distance: 171ft    Wheelchair 50 feet with 2 turns activity    Assist        Assist Level: Supervision/Verbal cueing   Wheelchair 150 feet activity     Assist      Assist Level: Supervision/Verbal cueing   Blood pressure (!) 137/58, pulse 69, temperature 97.9 F (36.6 C), resp. rate 17, height 5\' 4"  (1.626 m), weight 71.7 kg, SpO2 97%.  Medical Problem List and Plan: 1. Functional deficits secondary to debility after acute respiratory failure and multiple medical issues             -patient may not yet shower             -ELOS/Goals: 10-14 days, supervision goals with PT, OT, SLP  Left message for daughter to update her  -continue CIR 2.  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 Acute on chronic LBP/Pain Management: Continue Hydrocodone  prn severe pain--monitor for  sedation/confusion.              --Was on IV dilaudid  till 04/16-->now on hydrocodone  10 TID on average.              --increased use of Zanaflex  in past 48 hours.              --continue Gabapentin  300 mg/hs 4. Hx of depression/anxiety/Sleep: LCSW to follow for evaluation and support             --melatonin prn for insomnia.              -  antipsychotic agents: N/A             -pt on chronic prozac  20mg  daily and remeron  7.5mg  nightly 5. Neuropsych/cognition: This patient is not capable of making decisions on her own behalf. 6. Skin/Wound Care: Routine pressure relief measures.              -float both heels 7. Fluids/Electrolytes/Nutrition: Strict I/O. Labs with HD             --1200 cc FR/day.  8. Cellulitis/Possible abscess LUE: On Van since 04/19 and Zosyn  since 04/17 --WBC trending down to 10.5 today. BC X 2 from 04/19 pending--negative so far.  --has been afebrile X 24 hours.  Zosyn  changed to Augmentin  by Dr. Sunnie England. End date for Vanc/augmentin  is 4/29              -pt reports that arm is feeling less tender -07/14/23 no fevers but now worsening leukocytosis, WBC 19.7 yesterday and 20.4 on repeat today, LUE not really that concerning looking -concerned for bacteremia, will repeat BCx (prior ones were NGTD x5d) -check CXR, U/A -when knee aspiration/injection is done, would recommend sending Cx -consulted pharmacy to see about abx choice/dosing particularly of the augmentin -- Ivery Marking said to switch augmentin  to ceftriaxone  and leave vanc for now -recheck CBC tomorrow morning  9. H/o GIB: Monitor H/H as had further drop. Hgb down to             On IV iron /ESA  -07/13/23 Hgb stable last labs 4/20, monitor labs  -07/14/23 Hgb 8.4, stable/improving; monitor  10. HTN: Monitor BP TID--on Torsemide  on non dialysis days.   -4/26-27/25 [name] good, monitor  Vitals:   07/14/23 0100 07/14/23 0104 07/14/23 0109 07/14/23 0146  BP: (!) 81/45 110/78 (!) 123/54 (!) 126/45   07/14/23 0437 07/14/23  1300 07/14/23 2015 07/15/23 0430  BP: (!) 114/46 (!) 95/44 (!) 135/52 (!) 164/63   07/15/23 1125 07/15/23 1359 07/15/23 1913 07/16/23 0436  BP: (!) 143/69 135/63 (!) 118/50 (!) 137/58     11. 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 H/o Gastritis/esophagitis: Most recent admission last month. Continue Protonix  40mg  BID.  --Has required one unit PRBC 04/16.              --monitor for signs of bleeding.   13. Chronic diarrhea:  continue Colestid  1g TID and Creon  36kU tid- changed to after meals as per daughter's request, last BM 4/28  14. H/o cognitive/memory deficits: continue Exelon  1.5mg  bid.    15. ESRD: Now HD dependent--TTS at the end of the day to help with tolerance of therapy             --continue renveal for metabolic bone disease.  Per nephro  17. T2DM: Hgb A1c- 6.2 and diet controlled. Add CM restrictions.  -d/c ISS, d/c CBGs  18. Bilateral knee pain: voltaren  gel scheduled, right knee sleeve ordered, continue  19. Bowel incontinence: lumbar MRI showed and discussed there is no evidence of cauda equina  20. New upper extremity weakness: cervical MRI ordered and discussed that there is moderate canal stenosis, discussed with daughter that oral steroids, steroid injection, surgery are possible interventions- deferred at this time given patient's risk factors  21. Right knee effusion: culture reviewed and with 89,000 WBC concerning for septic arthritis, will consult ID, repeat CBC, and order procal  21. Leukocytosis: repeat CBC ordered today  LOS: 8 days A FACE TO FACE EVALUATION WAS PERFORMED  Lavell Portugal P  Florita Nitsch 07/16/2023, 9:25 AM

## 2023-07-16 NOTE — Progress Notes (Addendum)
 Want to avoid steroids due to concerns of LUE infection and recurrent fever. Discussed HD dose for gout Tx w/pharmacy--colchicine  0.6 mg X one dose (repeat in 14 days if symptomatic). Order written. Will monitor for effectiveness. Continue \ Voltaren  gel for local measures.

## 2023-07-17 ENCOUNTER — Ambulatory Visit: Payer: Medicare HMO | Admitting: Neurology

## 2023-07-17 LAB — CBC WITH DIFFERENTIAL/PLATELET
Abs Immature Granulocytes: 0.46 10*3/uL — ABNORMAL HIGH (ref 0.00–0.07)
Basophils Absolute: 0.1 10*3/uL (ref 0.0–0.1)
Basophils Relative: 1 %
Eosinophils Absolute: 0.1 10*3/uL (ref 0.0–0.5)
Eosinophils Relative: 1 %
HCT: 27.9 % — ABNORMAL LOW (ref 36.0–46.0)
Hemoglobin: 8.7 g/dL — ABNORMAL LOW (ref 12.0–15.0)
Immature Granulocytes: 4 %
Lymphocytes Relative: 11 %
Lymphs Abs: 1.3 10*3/uL (ref 0.7–4.0)
MCH: 28.3 pg (ref 26.0–34.0)
MCHC: 31.2 g/dL (ref 30.0–36.0)
MCV: 90.9 fL (ref 80.0–100.0)
Monocytes Absolute: 0.9 10*3/uL (ref 0.1–1.0)
Monocytes Relative: 7 %
Neutro Abs: 9.2 10*3/uL — ABNORMAL HIGH (ref 1.7–7.7)
Neutrophils Relative %: 76 %
Platelets: 476 10*3/uL — ABNORMAL HIGH (ref 150–400)
RBC: 3.07 MIL/uL — ABNORMAL LOW (ref 3.87–5.11)
RDW: 16.7 % — ABNORMAL HIGH (ref 11.5–15.5)
WBC: 12 10*3/uL — ABNORMAL HIGH (ref 4.0–10.5)
nRBC: 0.4 % — ABNORMAL HIGH (ref 0.0–0.2)

## 2023-07-17 LAB — GLUCOSE, CAPILLARY: Glucose-Capillary: 161 mg/dL — ABNORMAL HIGH (ref 70–99)

## 2023-07-17 MED ORDER — OXYCODONE HCL 5 MG PO TABS
5.0000 mg | ORAL_TABLET | Freq: Once | ORAL | Status: AC
  Administered 2023-07-17: 5 mg via ORAL
  Filled 2023-07-17: qty 1

## 2023-07-17 MED ORDER — OXYCODONE HCL 5 MG PO TABS
5.0000 mg | ORAL_TABLET | ORAL | Status: DC | PRN
  Administered 2023-07-17 – 2023-07-31 (×25): 5 mg via ORAL
  Filled 2023-07-17 (×28): qty 1

## 2023-07-17 MED ORDER — MEMANTINE HCL 10 MG PO TABS
5.0000 mg | ORAL_TABLET | Freq: Every day | ORAL | Status: DC
  Administered 2023-07-17 – 2023-08-01 (×16): 5 mg via ORAL
  Filled 2023-07-17 (×16): qty 1

## 2023-07-17 NOTE — Progress Notes (Signed)
 PROGRESS NOTE   Subjective/Complaints: WBC slightly elevated yesterday, will repeat today Hgb downtrended to 7.6 yesterday, will repeat today  ROS: +upper and lower extremity weakness, +bowel incontinence, +right knee pain-limits therapy. Denies CP, SOB, abd pain, n/v/d/c, +dizziness   Objective:   No results found.   Recent Labs    07/16/23 1006 07/16/23 1354  WBC 12.6* 14.9*  HGB 8.3* 7.6*  HCT 26.6* 24.9*  PLT 474* 470*    Recent Labs    07/16/23 1354  NA 130*  K 4.0  CL 92*  CO2 19*  GLUCOSE 153*  BUN 94*  CREATININE 8.11*  CALCIUM  9.5     Intake/Output Summary (Last 24 hours) at 07/17/2023 1136 Last data filed at 07/17/2023 0800 Gross per 24 hour  Intake 340 ml  Output 1500 ml  Net -1160 ml        Physical Exam: Vital Signs Blood pressure (!) 153/61, pulse 79, temperature 99.1 F (37.3 C), temperature source Oral, resp. rate 16, height 5\' 4"  (1.626 m), weight 70.7 kg, SpO2 100%.  Gen: no distress, normal appearing, resting in bed.  HEENT: oral mucosa pink and moist, NCAT Cardio: Reg rate, no m/r/g appreciated Chest: normal effort, normal rate of breathing, CTAB Abd: soft, non-distended, +BS throughout, nonTTP Ext/MsK: no edema peripherally, LUE with graft site at L elbow with very faint erythema and slight warmth noted, no red streaking. Seems better than previously described. R knee tender, with effusion, but no erythema or significant warmth.  Psychiatric:     Comments: Pt flat, cooperative  Musculoskeletal:        General: Swelling and tenderness (left antecubital region) present.  Right knee swollen and tender    Cervical back: Normal range of motion.  Skin:    Comments: AVF site CDI. No erythema. Right heel boggy  Neurological:     Mental Status: She is alert.     Comments: Oriented to self only. Unable to recall age, DOB or place. Poor recall and decrease insight into deficits. She  was able to follow simple motor commands. No CN deficits. Limited RUE d/t HD catheter. LUE limited d/t pain. Grip/wrist at least 3-4/5 bilaterally. BLE 3- to 3/5 HF, KE and 4/5 ADF/PF. Decreased LT from ankles down bilaterally. No abnl resting tone. DTR's tr to 1+, stable 4/29 Functional mobility: modA toileting     Assessment/Plan: 1. Functional deficits which require 3+ hours per day of interdisciplinary therapy in a comprehensive inpatient rehab setting. Physiatrist is providing close team supervision and 24 hour management of active medical problems listed below. Physiatrist and rehab team continue to assess barriers to discharge/monitor patient progress toward functional and medical goals  Care Tool:  Bathing    Body parts bathed by patient: Right arm, Left arm, Chest, Abdomen, Front perineal area, Right upper leg, Left upper leg, Right lower leg, Left lower leg, Face   Body parts bathed by helper: Buttocks     Bathing assist Assist Level: Minimal Assistance - Patient > 75%     Upper Body Dressing/Undressing Upper body dressing   What is the patient wearing?: Pull over shirt, Bra    Upper body assist Assist Level: Supervision/Verbal cueing  Lower Body Dressing/Undressing Lower body dressing      What is the patient wearing?: Pants     Lower body assist Assist for lower body dressing: Contact Guard/Touching assist     Toileting Toileting    Toileting assist Assist for toileting: Moderate Assistance - Patient 50 - 74%     Transfers Chair/bed transfer  Transfers assist     Chair/bed transfer assist level: Minimal Assistance - Patient > 75%     Locomotion Ambulation   Ambulation assist      Assist level: Minimal Assistance - Patient > 75% Assistive device: Walker-rolling Max distance: 38ft   Walk 10 feet activity   Assist  Walk 10 feet activity did not occur: Safety/medical concerns  Assist level: Minimal Assistance - Patient > 75% Assistive  device: Walker-rolling   Walk 50 feet activity   Assist Walk 50 feet with 2 turns activity did not occur: Safety/medical concerns (pain)         Walk 150 feet activity   Assist Walk 150 feet activity did not occur: Safety/medical concerns (pain)         Walk 10 feet on uneven surface  activity   Assist Walk 10 feet on uneven surfaces activity did not occur: Safety/medical concerns (pain)         Wheelchair     Assist Is the patient using a wheelchair?: Yes Type of Wheelchair: Manual (lightweight)    Wheelchair assist level: Supervision/Verbal cueing Max wheelchair distance: 130ft    Wheelchair 50 feet with 2 turns activity    Assist        Assist Level: Supervision/Verbal cueing   Wheelchair 150 feet activity     Assist      Assist Level: Supervision/Verbal cueing   Blood pressure (!) 153/61, pulse 79, temperature 99.1 F (37.3 C), temperature source Oral, resp. rate 16, height 5\' 4"  (1.626 m), weight 70.7 kg, SpO2 100%.  Medical Problem List and Plan: 1. Functional deficits secondary to debility after acute respiratory failure and multiple medical issues             -patient may not yet shower             -ELOS/Goals: 10-14 days, supervision goals with PT, OT, SLP  Updated Josephus Nida  -continue CIR 2.  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 Acute on chronic LBP/Pain Management: Continue Hydrocodone  prn severe pain--monitor for sedation/confusion.              --Was on IV dilaudid  till 04/16-->now on hydrocodone  10 TID on average.              --increased use of Zanaflex  in past 48 hours.              --continue Gabapentin  300 mg/hs 4. Hx of depression/anxiety/Sleep: LCSW to follow for evaluation and support             --melatonin prn for insomnia.              -antipsychotic agents: N/A             -pt on chronic prozac  20mg  daily  and remeron  7.5mg  nightly 5. Neuropsych/cognition: This patient is not capable of making decisions on her own behalf. 6. Skin/Wound Care: Routine pressure relief measures.              -float both heels  7. Fluids/Electrolytes/Nutrition: Strict I/O. Labs with HD             --1200 cc FR/day.  8. Cellulitis/Possible abscess LUE: On Van since 04/19 and Zosyn  since 04/17 --WBC trending down to 10.5 today. BC X 2 from 04/19 pending--negative so far.  --has been afebrile X 24 hours.  Zosyn  changed to Augmentin  by Dr. Sunnie England. End date for Vanc/augmentin  is 4/29              -pt reports that arm is feeling less tender -07/14/23 no fevers but now worsening leukocytosis, WBC 19.7 yesterday and 20.4 on repeat today, LUE not really that concerning looking -concerned for bacteremia, will repeat BCx (prior ones were NGTD x5d) -check CXR, U/A -when knee aspiration/injection is done, would recommend sending Cx -consulted pharmacy to see about abx choice/dosing particularly of the augmentin -- Ivery Marking said to switch augmentin  to ceftriaxone  and leave vanc for now -recheck CBC tomorrow morning  9. H/o GIB: Monitor H/H as had further drop. Hgb down to             On IV iron /ESA  -07/13/23 Hgb stable last labs 4/20, monitor labs  -07/14/23 Hgb 8.4, stable/improving; monitor  10. HTN: Monitor BP TID--on Torsemide  on non dialysis days.   -4/26-27/25 [name] good, monitor  Vitals:   07/16/23 1530 07/16/23 1600 07/16/23 1630 07/16/23 1700  BP: (!) 149/61 (!) 146/76 (!) 95/43 (!) 151/56   07/16/23 1730 07/16/23 1740 07/16/23 1741 07/16/23 1845  BP: 132/69 (!) 153/68 (!) 162/60 (!) 170/61   07/16/23 1947 07/16/23 2018 07/17/23 0530 07/17/23 0724  BP: 133/73 (!) 137/51 (!) 151/57 (!) 153/61     11. 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 H/o Gastritis/esophagitis: Most recent admission last month. Continue Protonix  40mg  BID.  --Has required one unit PRBC 04/16.              --monitor for signs  of bleeding.   13. Chronic diarrhea:  continue Colestid  1g TID and Creon  36kU tid- changed to after meals as per daughter's request, last BM 4/28  14. H/o cognitive/memory deficits: continue Exelon  1.5mg  bid. Namenda added  15. ESRD: Now HD dependent--TTS at the end of the day to help with tolerance of therapy             --continue renveal for metabolic bone disease.  Per nephro  17. T2DM: Hgb A1c- 6.2 and diet controlled. Add CM restrictions.  -d/c ISS, d/c CBGs  18. Bilateral knee pain: voltaren  gel scheduled, right knee sleeve ordered, continue  19. Bowel incontinence: lumbar MRI showed and discussed there is no evidence of cauda equina  20. New upper extremity weakness: cervical MRI ordered and discussed that there is moderate canal stenosis, discussed with daughter that oral steroids, steroid injection, surgery are possible interventions- deferred at this time given patient's risk factors  21. Right knee effusion: culture reviewed and with 89,000 WBC concerning for septic arthritis, will consult ID, procal elevated, will update ID, continue ceftriaxone /vanc  21. Leukocytosis:repeat CBC ordered today, procal ordered, consulted ID, continue ceftriaxone /vanc  22. Anemia: repeat CBC today, stool occult ordered  23. Dementia: added namenda 5mg  daily  24. Neuropathic pain: added namenda 5mg  daily, metanx added  LOS: 9 days A FACE TO FACE EVALUATION WAS PERFORMED  Keven Pel Meghanne Pletz 07/17/2023, 11:36 AM

## 2023-07-17 NOTE — Progress Notes (Signed)
 Occupational Therapy Session Note  Patient Details  Name: [name] MRN: [id number] Date of Birth: April 12, 1945  Today's Date: 07/17/2023 OT Individual Time: [phone] & 1335-1445 OT Individual Time Calculation (min): 70 min & 70 min   Short Term Goals: Week 2:  OT Short Term Goal 1 (Week 2): Pt will complete sit > stand in prep for ADL with CGA using LRAD OT Short Term Goal 2 (Week 2): Pt will complete toilet transfer with CGA using LRAD OT Short Term Goal 3 (Week 2): Pt will follow 1 step commands to complete LB dressing with min A  Skilled Therapeutic Interventions/Progress Updates:  Session 1 Skilled OT intervention completed with focus on ADL retraining, functional transfers and activity tolerance. Pt received upright in bed, agreeable to session. Unrated pain reported in R knee; nurse present for meds. OT offered rest breaks, repositioning, application of knee sleeve to R knee for pain reduction.  Pt with poor recognition of OT name/face despite having worked together for several sessions. Pt declined self-care needs however receptive to trial toileting to manage incontinence. Transitioned > EOB with supervision however increased time. Pt noted to have slow processing and increased cues needed this AM. Light min A sit > stand using RW, then light min A stand pivot with RW > w/c with cues for backing up to w/c. Transported > BSC over toilet. CGA sit > stand using grab bar, then min A stand pivot, max A to lower brief. Pt was continent or urinary void and BM; charted. Pt able to complete all pericare seated and standing with CGA for standing balance with intermittent use of grab bar. Donned new brief and pants with max A for time. Mod A stand pivot with grab bar > w/c as pt attempted to prematurely sit.   Seated in w/c, pt doffed shirt with cues for pulling from behind head due to poor BUE ROM. Pt washed UB, donned deo with supervision. Max cues needed, however min A for donning both  shirts due to tight fit. Set up A for oral care. Donned shoes with max A. Pt self-propelled in w/c about 125 ft using BUE with supervision, with cues needed for turns and avoiding proximity to walls. In gym, OT set up obstacle course using cones in prep for home navigation with doorways etc. Pt required min A for first 2 cones, then supervision for last two with step by step cues to cut the angle wider.  Transported dependently in w/c back to room.  Pt remained seated in w/c, with belt alarm on/activated, and with all needs in reach at end of session.  Vitals BP 125/59 (sitting EOB); mildly symptomatic BP 126/53 (after transfer to w/c)  Session 2 Skilled OT intervention completed with focus on activity tolerance, BUE ROM/strength, FMC/strength. Pt received seated in w/c, agreeable to session. 9/10 pain reported in R knee; nurse notified of pain med request however did not provide til mid session. OT offered rest breaks, repositioning throughout for pain reduction.  Pt declined self-care needs reporting inability to work on standing/walking due to current pain level. Transported dependently in w/c <> gym. Seated at table top, pt completed the following activities to promote functional use of BUE needed for independence with BADLs: Table slides on BUE (x20) -shoulder flexion -horizontal abduction FMC -using BUE to manipulate theraputty including squeezing, flattening and pinching -retrieving beads from flat theraputty using both hands -using BUE to pick up small beads and place into each hole of the putty -small peg  design with geoboard, using BUE to place, and tweezers in either hand to remove from board; min difficulty with FM strength when using tweezers Grasp strength & digit/wrist ROM -pushing holes into flattened putty using peg piece with R then L hand  Back in room pt remained seated in w/c, with belt alarm on/activated, and with all needs in reach at end of session.   Therapy  Documentation Precautions:  Precautions Precautions: Fall Precaution/Restrictions Comments: dementia Restrictions Weight Bearing Restrictions Per Provider Order: No   Therapy/Group: Individual Therapy  Ruthanna Covert, MS, OTR/L  07/17/2023, 2:45 PM

## 2023-07-17 NOTE — Progress Notes (Signed)
 Physical Therapy Weekly Progress Note  Patient Details  Name: [name] MRN: [id number] Date of Birth: 11/24/1945  Beginning of progress report period: July 09, 2023 End of progress report period: July 17, 2023  Today's Date: 07/17/2023 PT Individual Time: 0930-1025 PT Individual Time Calculation (min): 55 min   Patient has met 0 of 3 short term goals. Pt demonstrates very slow progress towards long term goals. Pt is limited by bilateral knee pain RLE>LLE, global weakness/deconditioning, fatigue, and decreased balance/coordination. Pt currently requires min/mod A for sit<>stands with RW, min A for stand<>pivot transfers, min A to ambulate up to 21ft with RW, navigate 2 6in steps with 2 handrails and max A, and supervision for WC mobility up to 135ft using BUE. In standing, pt demonstrates bilateral knee flexion in stance with inability to extend knees to achieve upright posture due to pain. Currently recommend lightweight TIS WC for mobility and comfort due to pt's inability ot ambulate long distances. Pt will require family education training prior to discharge.   Patient continues to demonstrate the following deficits muscle weakness, decreased cardiorespiratoy endurance, decreased awareness, decreased problem solving, decreased safety awareness, and decreased memory, and decreased standing balance, decreased postural control, and decreased balance strategies and therefore will continue to benefit from skilled PT intervention to increase functional independence with mobility.  Patient progressing toward long term goals..  Continue plan of care.  PT Short Term Goals Week 1:  PT Short Term Goal 1 (Week 1): pt will transfer bed<>chair with LRAD and CGA PT Short Term Goal 1 - Progress (Week 1): Progressing toward goal PT Short Term Goal 2 (Week 1): pt will transfer sit<>stand with LRAD and CGA PT Short Term Goal 2 - Progress (Week 1): Progressing toward goal PT Short Term Goal 3 (Week  1): pt will ambulate 16ft with LRAD and CGA PT Short Term Goal 3 - Progress (Week 1): Progressing toward goal Week 2:  PT Short Term Goal 1 (Week 2): STG=LTG due to LOS  Skilled Therapeutic Interventions/Progress Updates:  Ambulation/gait training;Discharge planning;Functional mobility training;Therapeutic Activities;Balance/vestibular training;Disease management/prevention;Neuromuscular re-education;Skin care/wound management;Therapeutic Exercise;Wheelchair propulsion/positioning;Cognitive remediation/compensation;DME/adaptive equipment instruction;Pain management;Splinting/orthotics;UE/LE Strength taining/ROM;Community reintegration;Patient/family education;Stair training;UE/LE Coordination activities   Today's Interventions: Received pt sitting in WC, pt agreeable to PT treatment, and reported pain 10/10 in R knee (premedicated). Session with emphasis on functional mobility/transfers, generalized strengthening and endurance, dynamic standing balance/coordination, ambulation, and stair navigation.   Pt performed WC mobility 166ft using BUE and supervision with significantly increased time to main therapy gym - emphasis on UE strength and coordination/sequencing. Stood at staircase with cues to push up from [facility] with mod A and navigated 2 6in steps with bilateral handrails and max A. Pt required cues for "up with the good, down with the bad" technique. Noted bilateral knees flexed in stance and pt unable to achieve full knee, hip, or trunk extension due to pain.   Transferred onto Nustep via stand<>pivot with min A (mod A to stand) and performed seated BUE/BLE strengthening on Nustep at workload 3 for 13.5 minutes (pt requesting to continue on) for a total of 414 steps with emphasis on cardiovascular endurance and knee flexion ROM. Pt then ambulated 41ft with RW and CGA - limited by knee pain. Returned to room and concluded session with pt sitting in WC, needs within reach, and seatbelt alarm on.     Therapy Documentation Precautions:  Precautions Precautions: Fall Precaution/Restrictions Comments: dementia Restrictions Weight Bearing Restrictions Per Provider Order: No  Therapy/Group: Individual Therapy Antony Baumgartner  M Zaunegger Kerrin Markman Zaunegger PT, DPT 07/17/2023, 6:55 AM

## 2023-07-17 NOTE — Progress Notes (Addendum)
 Patient ID: [name], female   DOB: 1945/10/08, 78 y.o.   MRN: [id number]  1110- SW called pt dtr several times but calls go to voicemail. SW left a message requesting return phone call to discuss updates from the team.   *SW spoke with pt dtr [name] with attending to discuss updates from team conference. Pt will now d/c 5/8. SW will wait for follow-up about family education.   SW updated dialysis coord- [name] about change in d/c date.   [name], MSW, LCSW Office: [phone] Cell: [phone] Fax: [phone]

## 2023-07-17 NOTE — Progress Notes (Signed)
 Patient ID: [name], female   DOB: 25-Sep-1945, 78 y.o.   MRN: [id number]  HPI:  Pt is a 78 y.o.  with  hypertension, renal vein thrombosis on Eliquis , stroke, type II DM, lupus, CKD 4-5 who was recently admitted for CHF exacerbation and progressive CKD presented back with worsening dyspnea on exertion and fluid overload.    Assessment/Plan:   # Progressive CKD 5 versus AKI on CKD,: With frequent hospitalization for fluid overload.  Responding to IV diuretics but not po and with worsening renal function d/w patient and family (Daughter on phone 4/2) at length recommend starting dialysis. S/p LUE AVG + TDC placement 4/4 -> AVG removed bec of steal -Continue dialysis on TTS schedule this week; completed dialysis on Tuesday with a little bit of cramping at the end.   Will switch to MWF next week to facilitate dc.  - CLIP complete-  has been accepted at [facility]   # CKD-MBD/hyperphosphatemia: Increased sevelamer  on 4/21 to 2 TIDM. PTH at goal. Repeat on 5/1   #Anemia of CKD: Concern about GI bleed, underwent endoscopy t recently with no sign of bleeding.  Back on Eliquis .  Has required transfusion.  Given IV iron  and ESA   # Hypertension/volume: [name] improved after diuresis.  Torsemide  on non-dialysis days   #A fib: on eliquis    # Vascular access - developed steal syndrome of left hand. - VVS following s/p excision of AVG 07/01/23 due to concern for steal syndrome with improvement of symptoms and ok to resume anticoagulation.    #Disposition -now in CIR  S: Feels well today. May have knee aspiration and/or injection.  Denies fever, chills, nausea, vomiting.  Appetite decent.  O:BP (!) 153/61 (BP Location: Left Arm)   Pulse 79   Temp 99.1 F (37.3 C) (Oral)   Resp 16   Ht 5\' 4"  (1.626 m)   Wt 70.7 kg   SpO2 100%   BMI 26.75 kg/m   Intake/Output Summary (Last 24 hours) at 07/17/2023 0957 Last data filed at 07/17/2023 0800 Gross per 24 hour  Intake 340 ml  Output 1500 ml   Net -1160 ml   Intake/Output: I/O last 3 completed shifts: In: 100 [P.O.:100] Out: 1500 [Other:1500]  Intake/Output this shift:  Total I/O In: 240 [P.O.:240] Out: -  Weight change: 2.732 kg Gen: NAD lying in bed CVS: Normal rate, no rub Resp: Bilateral chest rise with no increased work of breathing Abd: +BS, soft, NT/ND Ext: no edema, warm and well-perfused Access: Right IJ TC  Recent Labs  Lab 07/13/23 2115 07/16/23 1354  NA 133* 130*  K 3.7 4.0  CL 93* 92*  CO2 20* 19*  GLUCOSE 238* 153*  BUN 95* 94*  CREATININE 8.20* 8.11*  ALBUMIN  2.0* 2.0*  CALCIUM  9.5 9.5  PHOS  --  4.9*  AST 47*  --   ALT 48*  --    Liver Function Tests: Recent Labs  Lab 07/13/23 2115 07/16/23 1354  AST 47*  --   ALT 48*  --   ALKPHOS 222*  --   BILITOT 0.7  --   PROT 6.8  --   ALBUMIN  2.0* 2.0*   No results for input(s): "LIPASE", "AMYLASE" in the last 168 hours. No results for input(s): "AMMONIA" in the last 168 hours. CBC: Recent Labs  Lab 07/13/23 2115 07/14/23 1057 07/15/23 0849 07/16/23 1006 07/16/23 1354  WBC 19.7* 20.4* 15.9* 12.6* 14.9*  NEUTROABS 17.4* 17.7* 13.7* 10.5*  --  HGB 7.9* 8.4* 7.8* 8.3* 7.6*  HCT 25.2* 27.0* 25.3* 26.6* 24.9*  MCV 93.7 92.2 93.0 91.7 92.9  PLT 476* 465* 451* 474* 470*   Cardiac Enzymes: No results for input(s): "CKTOTAL", "CKMB", "CKMBINDEX", "TROPONINI" in the last 168 hours. CBG: Recent Labs  Lab 07/10/23 2048 07/14/23 0100 07/16/23 2052  GLUCAP 157* 137* 151*    Iron  Studies: No results for input(s): "IRON ", "TIBC", "TRANSFERRIN", "FERRITIN" in the last 72 hours. Studies/Results: No results found.    apixaban   5 mg Oral BID   B-complex with vitamin C  1 tablet Oral Daily   bupivacaine (PF)  10 mL Infiltration Once   bupivacaine (PF)  10 mL Infiltration Once   Chlorhexidine  Gluconate Cloth  6 each Topical Q12H   cholecalciferol   1,000 Units Oral Daily   colestipol   1 g Oral TID AC   darbepoetin (ARANESP ) injection -  DIALYSIS  200 mcg Subcutaneous Q Sat-1800   diclofenac  Sodium  2 g Topical QID   FLUoxetine   20 mg Oral Daily   gabapentin   300 mg Oral QHS   l-methylfolate-B6-B12  1 tablet Oral Daily   lipase/protease/amylase  36,000 Units Oral TID AC   mirtazapine   7.5 mg Oral QHS   multivitamin  1 tablet Oral QHS   oxyCODONE   5 mg Oral Once   pantoprazole   40 mg Oral BID   rivastigmine   1.5 mg Oral BID   sevelamer  carbonate  1,600 mg Oral TID WC   torsemide   100 mg Oral Q M,W,F   And   torsemide   100 mg Oral Q Sun    BMET    Component Value Date/Time   NA 130 (L) 07/16/2023 1354   NA 144 07/19/2020 1458   NA 140 03/14/2017 0810   K 4.0 07/16/2023 1354   K 3.8 03/14/2017 0810   CL 92 (L) 07/16/2023 1354   CO2 19 (L) 07/16/2023 1354   CO2 27 03/14/2017 0810   GLUCOSE 153 (H) 07/16/2023 1354   GLUCOSE 177 (H) 03/14/2017 0810   BUN 94 (H) 07/16/2023 1354   BUN 85 (HH) 07/19/2020 1458   BUN 35.6 (H) 03/14/2017 0810   CREATININE 8.11 (H) 07/16/2023 1354   CREATININE 2.31 (H) 10/25/2022 1355   CREATININE 1.8 (H) 03/14/2017 0810   CALCIUM  9.5 07/16/2023 1354   CALCIUM  8.3 (L) 06/18/2023 0334   CALCIUM  9.6 03/14/2017 0810   GFRNONAA 5 (L) 07/16/2023 1354   GFRNONAA 22 (L) 10/25/2022 1355   GFRNONAA 45 (L) 12/17/2013 1158   GFRAA 21 (L) 12/21/2019 0845   GFRAA 52 (L) 12/17/2013 1158   CBC    Component Value Date/Time   WBC 14.9 (H) 07/16/2023 1354   RBC 2.68 (L) 07/16/2023 1354   HGB 7.6 (L) 07/16/2023 1354   HGB 9.4 (L) 04/18/2023 1434   HGB 9.3 (L) 03/14/2017 0810   HCT 24.9 (L) 07/16/2023 1354   HCT 26.4 (L) 07/21/2019 0809   HCT 29.2 (L) 03/14/2017 0810   PLT 470 (H) 07/16/2023 1354   PLT 235 04/18/2023 1434   PLT 292 03/14/2017 0810   PLT 351 10/11/2016 1139   MCV 92.9 07/16/2023 1354   MCV 81.3 03/14/2017 0810   MCH 28.4 07/16/2023 1354   MCHC 30.5 07/16/2023 1354   RDW 16.8 (H) 07/16/2023 1354   RDW 14.0 03/14/2017 0810   LYMPHSABS 1.0 07/16/2023 1006   LYMPHSABS  1.4 03/14/2017 0810   MONOABS 0.7 07/16/2023 1006   MONOABS 0.4 03/14/2017 0810   EOSABS 0.1 07/16/2023  1006   EOSABS 0.1 03/14/2017 0810   BASOSABS 0.1 07/16/2023 1006   BASOSABS 0.0 03/14/2017 0810

## 2023-07-17 NOTE — Patient Care Conference (Signed)
 Inpatient RehabilitationTeam Conference and Plan of Care Update Date: 07/17/2023   Time: 11:31 AM    Patient Name: [name]      Medical Record Number: [id number]  Date of Birth: 1945-09-25 Sex: Female         Room/Bed: 4W21C/4W21C-01 Payor Info: Payor: [employer] MEDICARE / Plan: [employer] MEDICARE HMO / Product Type: *No Product type* /    Admit Date/Time:  07/08/2023  2:01 PM  Primary Diagnosis:  Debility  Hospital Problems: Principal Problem:   Debility    Expected Discharge Date: Expected Discharge Date: 07/25/23  Team Members Present: Physician leading conference: Dr. Laverle Postin Social Worker Present: Norval Been, LCSWA Nurse Present: Forrestine Ike, RN PT Present: Nena Bank, PT OT Present: Tim Fontana, OT PPS Coordinator present : Jestine Moron, SLP     Current Status/Progress Goal Weekly Team Focus  Bowel/Bladder      Continent of bladder; new onset of bowel incontinence    Manage bowel w mod I and bladder w toileting     UA, MRI (stenosis)   Swallow/Nutrition/ Hydration               ADL's   Fluctuates with functional performance with fatigue/pain level however has progressed to as little as min A UB and LB, mod A toileting   Supervision   Barriers- impaired grasp strength, high pain levels, cognitive deficits (awareness, problem solving), dynamic standing balance, GM and FM coorination    Mobility   bed mobility supervision/min A, transfers with RW mod A, gait 14ft with RW min A - limited by bilateral knee pain r>L   supervision  barriers: knee pain, dementia, global weakness/deconditioning, cognitive impairments, decreased standing balance/coordination. Plan for lightweight WC evaluation on 4/29    Communication                Safety/Cognition/ Behavioral Observations               Pain      Bil knee pain, right >left Aspiration of knee with steroid injection on left and hold on right pending results of aspirate    Pain  managed < 4 with prns    Assess pain management and effectiveness of medications/treatments, knee sleeve etc.  Skin     N/a            Discharge Planning:  D/c to home with DIL. PRN support from other family. SW will confirm there are no barriers to discharge.   Team Discussion: Patient with debility, ESRD with new HD with history of dementia, poor recall and carryover. Function fluctuates with fatigue and dizziness post HD.   Patient on target to meet rehab goals: Currently needs min  assist for upper body care and mod assist for lower body and toileting. Able to ambulate up to 14' using  a RW with min assist. Completes sit - stand transfers with mod assist.   *See Care Plan and progress notes for long and short-term goals.   Revisions to Treatment Plan:  Aspirate knee/steroid injection/colchicine  for pseudo gout vs septic arthritis per MD Downgraded goals to min assist  Teaching Needs: Safety, medications, transfers, toileting, etc.   Current Barriers to Discharge: Decreased caregiver support and Home enviroment access/layout and new Hemodialysis  Possible Resolutions to Barriers: Family education     Medical Summary Current Status: bilateral knee pain, dementia, fluctuating hypo and hypertension    Barriers to Discharge Comments: bilateral knee pain, dementia, fluctuating hypo and hypertension Possible Resolutions to [employer]  Focus: knee sleeves ordered, oxycodone  ordered, voltaren  gel ordered, hydrocodone  ordered, ortho consutled for aspiration/marcaine  injection, colchicine  started, continue to monitor BP TID   Continued Need for Acute Rehabilitation Level of Care: The patient requires daily medical management by a physician with specialized training in physical medicine and rehabilitation for the following reasons: Direction of a multidisciplinary physical rehabilitation program to maximize functional independence : Yes Medical management of patient stability  for increased activity during participation in an intensive rehabilitation regime.: Yes Analysis of laboratory values and/or radiology reports with any subsequent need for medication adjustment and/or medical intervention. : Yes   I attest that I was present, lead the team conference, and concur with the assessment and plan of the team.   Naoma Bacca 07/17/2023, 2:08 PM

## 2023-07-18 ENCOUNTER — Inpatient Hospital Stay (HOSPITAL_COMMUNITY)

## 2023-07-18 DIAGNOSIS — M1120 Other chondrocalcinosis, unspecified site: Secondary | ICD-10-CM | POA: Insufficient documentation

## 2023-07-18 DIAGNOSIS — M009 Pyogenic arthritis, unspecified: Secondary | ICD-10-CM | POA: Insufficient documentation

## 2023-07-18 LAB — RENAL FUNCTION PANEL
Albumin: 2.4 g/dL — ABNORMAL LOW (ref 3.5–5.0)
Anion gap: 20 — ABNORMAL HIGH (ref 5–15)
BUN: 72 mg/dL — ABNORMAL HIGH (ref 8–23)
CO2: 23 mmol/L (ref 22–32)
Calcium: 10.1 mg/dL (ref 8.9–10.3)
Chloride: 92 mmol/L — ABNORMAL LOW (ref 98–111)
Creatinine, Ser: 6.72 mg/dL — ABNORMAL HIGH (ref 0.44–1.00)
GFR, Estimated: 6 mL/min — ABNORMAL LOW (ref >60)
Glucose, Bld: 106 mg/dL — ABNORMAL HIGH (ref 70–99)
Phosphorus: 4.2 mg/dL (ref 2.5–4.6)
Potassium: 4.2 mmol/L (ref 3.5–5.1)
Sodium: 135 mmol/L (ref 135–145)

## 2023-07-18 LAB — CBC
HCT: 27.4 % — ABNORMAL LOW (ref 36.0–46.0)
Hemoglobin: 8.2 g/dL — ABNORMAL LOW (ref 12.0–15.0)
MCH: 27.7 pg (ref 26.0–34.0)
MCHC: 29.9 g/dL — ABNORMAL LOW (ref 30.0–36.0)
MCV: 92.6 fL (ref 80.0–100.0)
Platelets: 520 10*3/uL — ABNORMAL HIGH (ref 150–400)
RBC: 2.96 MIL/uL — ABNORMAL LOW (ref 3.87–5.11)
RDW: 16.9 % — ABNORMAL HIGH (ref 11.5–15.5)
WBC: 11.6 10*3/uL — ABNORMAL HIGH (ref 4.0–10.5)
nRBC: 0.5 % — ABNORMAL HIGH (ref 0.0–0.2)

## 2023-07-18 LAB — GLUCOSE, CAPILLARY: Glucose-Capillary: 162 mg/dL — ABNORMAL HIGH (ref 70–99)

## 2023-07-18 MED ORDER — HEPARIN SODIUM (PORCINE) 1000 UNIT/ML IJ SOLN
INTRAMUSCULAR | Status: AC
  Filled 2023-07-18: qty 1

## 2023-07-18 MED ORDER — VANCOMYCIN HCL 750 MG/150ML IV SOLN
INTRAVENOUS | Status: AC
  Filled 2023-07-18: qty 150

## 2023-07-18 MED ORDER — HEPARIN SODIUM (PORCINE) 1000 UNIT/ML IJ SOLN
3200.0000 [IU] | Freq: Once | INTRAMUSCULAR | Status: AC
  Administered 2023-07-18: 3200 [IU] via INTRAVENOUS

## 2023-07-18 NOTE — Progress Notes (Signed)
 Patient returned to department from dialysis, no c/o

## 2023-07-18 NOTE — Progress Notes (Signed)
 PROGRESS NOTE   Subjective/Complaints: Discussed that she is scheduled for ortho right knee washout at 7:30am, discussed tat hopefully this will help clear her infection and improve her knee pain  ROS: +upper and lower extremity weakness, +bowel incontinence, +right knee pain-limits therapy, continues but is betterr. Denies CP, SOB, abd pain, n/v/d/c, +dizziness   Objective:   No results found.   Recent Labs    07/16/23 1354 07/17/23 1244  WBC 14.9* 12.0*  HGB 7.6* 8.7*  HCT 24.9* 27.9*  PLT 470* 476*    Recent Labs    07/16/23 1354  NA 130*  K 4.0  CL 92*  CO2 19*  GLUCOSE 153*  BUN 94*  CREATININE 8.11*  CALCIUM  9.5     Intake/Output Summary (Last 24 hours) at 07/18/2023 1035 Last data filed at 07/18/2023 0726 Gross per 24 hour  Intake 571.89 ml  Output --  Net 571.89 ml        Physical Exam: Vital Signs Blood pressure (!) 149/57, pulse 78, temperature 98.2 F (36.8 C), temperature source Oral, resp. rate 16, height 5\' 4"  (1.626 m), weight 70.7 kg, SpO2 98%.  Gen: no distress, normal appearing, resting in bed.  HEENT: oral mucosa pink and moist, NCAT Cardio: Reg rate, no m/r/g appreciated Chest: normal effort, normal rate of breathing, CTAB Abd: soft, non-distended, +BS throughout, nonTTP Ext/MsK: no edema peripherally, LUE with graft site at L elbow with very faint erythema and slight warmth noted, no red streaking. Seems better than previously described. R knee tender, with effusion, but no erythema or significant warmth.  Psychiatric:     Comments: Pt flat, cooperative  Musculoskeletal:        General: Swelling and tenderness (left antecubital region) present.  Right knee swollen and tender    Cervical back: Normal range of motion.  Skin:    Comments: AVF site CDI. No erythema. Right heel boggy  Neurological:     Mental Status: She is alert.     Comments: Oriented to self only. Unable to  recall age, DOB or place. Poor recall and decrease insight into deficits. She was able to follow simple motor commands. No CN deficits. Limited RUE d/t HD catheter. LUE limited d/t pain. Grip/wrist at least 3-4/5 bilaterally. BLE 3- to 3/5 HF, KE and 4/5 ADF/PF. Decreased LT from ankles down bilaterally. No abnl resting tone. DTR's tr to 1+, stable 5/1 Functional mobility: modA toileting     Assessment/Plan: 1. Functional deficits which require 3+ hours per day of interdisciplinary therapy in a comprehensive inpatient rehab setting. Physiatrist is providing close team supervision and 24 hour management of active medical problems listed below. Physiatrist and rehab team continue to assess barriers to discharge/monitor patient progress toward functional and medical goals  Care Tool:  Bathing    Body parts bathed by patient: Right arm, Left arm, Chest, Abdomen, Front perineal area, Right upper leg, Left upper leg, Right lower leg, Left lower leg, Face   Body parts bathed by helper: Buttocks     Bathing assist Assist Level: Minimal Assistance - Patient > 75%     Upper Body Dressing/Undressing Upper body dressing   What is the patient wearing?:  Pull over shirt, Bra    Upper body assist Assist Level: Supervision/Verbal cueing    Lower Body Dressing/Undressing Lower body dressing      What is the patient wearing?: Pants     Lower body assist Assist for lower body dressing: Contact Guard/Touching assist     Toileting Toileting    Toileting assist Assist for toileting: Moderate Assistance - Patient 50 - 74%     Transfers Chair/bed transfer  Transfers assist     Chair/bed transfer assist level: Minimal Assistance - Patient > 75%     Locomotion Ambulation   Ambulation assist      Assist level: Minimal Assistance - Patient > 75% Assistive device: Walker-rolling Max distance: 88ft   Walk 10 feet activity   Assist  Walk 10 feet activity did not occur:  Safety/medical concerns  Assist level: Minimal Assistance - Patient > 75% Assistive device: Walker-rolling   Walk 50 feet activity   Assist Walk 50 feet with 2 turns activity did not occur: Safety/medical concerns (pain)         Walk 150 feet activity   Assist Walk 150 feet activity did not occur: Safety/medical concerns (pain)         Walk 10 feet on uneven surface  activity   Assist Walk 10 feet on uneven surfaces activity did not occur: Safety/medical concerns (pain)         Wheelchair     Assist Is the patient using a wheelchair?: Yes Type of Wheelchair: Manual (lightweight)    Wheelchair assist level: Supervision/Verbal cueing Max wheelchair distance: 168ft    Wheelchair 50 feet with 2 turns activity    Assist        Assist Level: Supervision/Verbal cueing   Wheelchair 150 feet activity     Assist      Assist Level: Supervision/Verbal cueing   Blood pressure (!) 149/57, pulse 78, temperature 98.2 F (36.8 C), temperature source Oral, resp. rate 16, height 5\' 4"  (1.626 m), weight 70.7 kg, SpO2 98%.  Medical Problem List and Plan: 1. Functional deficits secondary to debility after acute respiratory failure and multiple medical issues             -patient may not yet shower             -ELOS/Goals: 10-14 days, supervision goals with PT, OT, SLP  Updated Josephus Nida  -continue CIR  Grounds pass ordered 2.  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 Acute on chronic LBP/Pain Management: Continue Hydrocodone  prn severe pain--monitor for sedation/confusion.              --Was on IV dilaudid  till 04/16-->now on hydrocodone  10 TID on average.              --increased use of Zanaflex  in past 48 hours.              --continue Gabapentin  300 mg/hs 4. Hx of depression/anxiety/Sleep: LCSW to follow for evaluation and support              --melatonin prn for insomnia.              -antipsychotic agents: N/A             -pt on chronic prozac  20mg  daily and remeron  7.5mg  nightly 5. Neuropsych/cognition: This patient is not capable of making decisions on her own behalf. 6. Skin/Wound  Care: Routine pressure relief measures.              -float both heels 7. Fluids/Electrolytes/Nutrition: Strict I/O. Labs with HD             --1200 cc FR/day.  8. Cellulitis/Possible abscess LUE: On Van since 04/19 and Zosyn  since 04/17 --WBC trending down to 10.5 today. BC X 2 from 04/19 pending--negative so far.  --has been afebrile X 24 hours.  Zosyn  changed to Augmentin  by Dr. Sunnie England. End date for Vanc/augmentin  is 4/29              -pt reports that arm is feeling less tender -07/14/23 no fevers but now worsening leukocytosis, WBC 19.7 yesterday and 20.4 on repeat today, LUE not really that concerning looking -concerned for bacteremia, will repeat BCx (prior ones were NGTD x5d) -check CXR, U/A -when knee aspiration/injection is done, would recommend sending Cx -consulted pharmacy to see about abx choice/dosing particularly of the augmentin -- Ivery Marking said to switch augmentin  to ceftriaxone  and leave vanc for now -recheck CBC tomorrow morning  9. H/o GIB: Monitor H/H as had further drop. Hgb down to             On IV iron /ESA  -07/13/23 Hgb stable last labs 4/20, monitor labs  -07/14/23 Hgb 8.4, stable/improving; monitor  10. HTN: Monitor BP TID--on Torsemide  on non dialysis days.   -4/26-27/25 [name] good, monitor  Vitals:   07/16/23 1700 07/16/23 1730 07/16/23 1740 07/16/23 1741  BP: (!) 151/56 132/69 (!) 153/68 (!) 162/60   07/16/23 1845 07/16/23 1947 07/16/23 2018 07/17/23 0530  BP: (!) 170/61 133/73 (!) 137/51 (!) 151/57   07/17/23 0724 07/17/23 1310 07/17/23 2043 07/18/23 0504  BP: (!) 153/61 (!) 125/53 (!) 150/61 (!) 149/57     11. 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 H/o Gastritis/esophagitis: Most recent  admission last month. Continue Protonix  40mg  BID.  --Has required one unit PRBC 04/16.              --monitor for signs of bleeding.   13. Chronic diarrhea:  continue Colestid  1g TID and Creon  36kU tid- changed to after meals as per daughter's request, last BM 4/28  14. H/o cognitive/memory deficits: continue Exelon  1.5mg  bid. Namenda  added  15. ESRD: Now HD dependent--TTS at the end of the day to help with tolerance of therapy             --continue renveal for metabolic bone disease.  Per nephro  17. T2DM: Hgb A1c- 6.2 and diet controlled. Add CM restrictions.  -d/c ISS, d/c CBGs  18. Bilateral knee pain: voltaren  gel scheduled, right knee sleeve ordered, continue  19. Bowel incontinence: lumbar MRI showed and discussed there is no evidence of cauda equina  20. New upper extremity weakness: cervical MRI ordered and discussed that there is moderate canal stenosis, discussed with daughter that oral steroids, steroid injection, surgery are possible interventions- deferred at this time given patient's risk factors  21. Right knee effusion: culture reviewed and with 89,000 WBC concerning for septic arthritis, will consult ID, procal elevated, discussed with ortho plan for washout on Friday, continue ceftriaxone /vanc  21. Leukocytosis:repeat CBC ordered, procal ordered, consulted ID, continue ceftriaxone /vanc  22. Anemia: Hgb reviewed and is improved  23. Dementia: added namenda  5mg  daily, continue  24. Neuropathic pain: added namenda  5mg  daily, metanx added, discussed with daughter  LOS: 10 days A FACE TO FACE EVALUATION WAS PERFORMED  Amara Justen P Telena Peyser  07/18/2023, 10:35 AM

## 2023-07-18 NOTE — Plan of Care (Signed)
 Problem: Education: Goal: Knowledge of General Education information will improve Description: Including pain rating scale, medication(s)/side effects and non-pharmacologic comfort measures Outcome: Progressing Outcome: Progressing   Problem: Health Behavior/Discharge Planning: Goal: Ability to manage health-related needs will improve Outcome: Progressing Outcome: Progressing   Problem: Clinical Measurements: Goal: Ability to maintain clinical measurements within normal limits will improve Outcome: Progressing Outcome: Progressing  Goal: Will remain free from infection Outcome: Progressing Outcome: Progressing  Goal: Diagnostic test results will improve Outcome: Progressing Outcome: Progressing  Goal: Respiratory complications will improve Outcome: Progressing Outcome: Progressing  Goal: Cardiovascular complication will be avoided Outcome: Progressing Outcome: Progressing   Problem: Activity: Goal: Risk for activity intolerance will decrease 07/12/2023 1010 by Diamond Formica, RN Outcome: Progressing 07/12/2023 1010 by Diamond Formica, RN Outcome: Progressing   Problem: Nutrition: Goal: Adequate nutrition will be maintained Outcome: Progressing Outcome: Progressing   Problem: Coping: Goal: Level of anxiety will decrease Outcome: Progressing Outcome: Progressing   Problem: Elimination: Goal: Will not experience complications related to bowel motility Outcome: Progressing Outcome: Progressing  Goal: Will not experience complications related to urinary retention Outcome: Progressing Outcome: Progressing   Problem: Pain Managment: Goal: General experience of comfort will improve and/or be controlled Outcome: Progressing Outcome: Progressing   Problem: Safety: Goal: Ability to remain free from injury will improve Outcome: Progressing Outcome: Progressing   Problem: Education: Goal: Knowledge of disease or condition will improve Outcome: Progressing Outcome:  Progressing  Goal: Knowledge of secondary prevention will improve (MUST DOCUMENT ALL) Outcome: Progressing Outcome: Progressing  Goal: Knowledge of patient specific risk factors will improve (DELETE if not current risk factor) Outcome: Progressing Outcome: Progressing   Problem: Ischemic Stroke/TIA Tissue Perfusion: Goal: Complications of ischemic stroke/TIA will be minimized Outcome: Progressing Outcome: Progressing   Problem: Coping: Goal: Will identify appropriate support needs Outcome: Progressing Outcome: Progressing   Problem: Self-Care: Goal: Ability to participate in self-care as condition permits will improve Outcome: Progressing Outcome: Progressing  Goal: Verbalization of feelings and concerns over difficulty with self-care will improve Outcome: Progressing Outcome: Progressing

## 2023-07-18 NOTE — Progress Notes (Signed)
 Found on floor by NT next to bed on buttock, denies hitting  her head , assessed VS 115/54. 99.0,105,02 sat 99%. Oriented    Denies pain or discomfort, denies weakness or dizziness  2214 On call notified r/t Fall,  Recalled Pam and orders recied for CT Scan of Head, Tele-sitter and Neuro assessment per fall protocol 2226 Spoke with Genevia Kern  2228 Spoke with Josephus Nida with update  2308 Tele sitter notifed sple with Sahron  2350 Escorted to CT for stat xray Head, escorted by assigned nurse 07/19/2023 @0012  Back to room from CT Scan head

## 2023-07-18 NOTE — Progress Notes (Signed)
 Patient ID: [name], female   DOB: January 03, 1946, 78 y.o.   MRN: [id number]  HPI:  Pt is a 78 y.o.  with  hypertension, renal vein thrombosis on Eliquis , stroke, type II DM, lupus, CKD 4-5 who was recently admitted for CHF exacerbation and progressive CKD presented back with worsening dyspnea on exertion and fluid overload.    Assessment/Plan:   # Progressive CKD 5 versus AKI on CKD,: With frequent hospitalization for fluid overload.  Responding to IV diuretics but not po and with worsening renal function d/w patient and family (Daughter on phone 4/2) at length recommend starting dialysis. S/p LUE AVG + TDC placement 4/4 -> AVG removed bec of steal -Continue dialysis on TTS schedule this week; completed dialysis on Tuesday with a little bit of cramping at the end.   Will switch to MWF next week to facilitate dc.  - CLIP complete-  has been accepted at [facility]   # CKD-MBD/hyperphosphatemia: Increased sevelamer  on 4/21 to 2 TIDM. PTH at goal. Repeat on 5/1   #Anemia of CKD: Concern about GI bleed, underwent endoscopy t recently with no sign of bleeding.  Back on Eliquis .  Has required transfusion.  Given IV iron  and ESA   # Hypertension/volume: [name] improved after diuresis.  Torsemide  on non-dialysis days   #A fib: on eliquis    # Vascular access - developed steal syndrome of left hand. - VVS following s/p excision of AVG 07/01/23 due to concern for steal syndrome with improvement of symptoms and ok to resume anticoagulation.    #Disposition -now in CIR  S: Feels well today. May have knee aspiration and/or injection.  Denies fever, chills, nausea, vomiting.  Appetite decent.  O:BP (!) 149/57 (BP Location: Left Arm)   Pulse 78   Temp 98.2 F (36.8 C) (Oral)   Resp 16   Ht 5\' 4"  (1.626 m)   Wt 70.7 kg   SpO2 98%   BMI 26.75 kg/m   Intake/Output Summary (Last 24 hours) at 07/18/2023 0825 Last data filed at 07/18/2023 0726 Gross per 24 hour  Intake 571.89 ml  Output --  Net  571.89 ml   Intake/Output: I/O last 3 completed shifts: In: 791.9 [P.O.:540; IV Piggyback:251.9] Out: -   Intake/Output this shift:  Total I/O In: 120 [P.O.:120] Out: -  Weight change: -3.7 kg Gen: NAD lying in bed CVS: Normal rate, no rub Resp: Bilateral chest rise with no increased work of breathing Abd: +BS, soft, NT/ND Ext: no edema, warm and well-perfused Access: Right IJ TC  Recent Labs  Lab 07/13/23 2115 07/16/23 1354  NA 133* 130*  K 3.7 4.0  CL 93* 92*  CO2 20* 19*  GLUCOSE 238* 153*  BUN 95* 94*  CREATININE 8.20* 8.11*  ALBUMIN  2.0* 2.0*  CALCIUM  9.5 9.5  PHOS  --  4.9*  AST 47*  --   ALT 48*  --    Liver Function Tests: Recent Labs  Lab 07/13/23 2115 07/16/23 1354  AST 47*  --   ALT 48*  --   ALKPHOS 222*  --   BILITOT 0.7  --   PROT 6.8  --   ALBUMIN  2.0* 2.0*   No results for input(s): "LIPASE", "AMYLASE" in the last 168 hours. No results for input(s): "AMMONIA" in the last 168 hours. CBC: Recent Labs  Lab 07/14/23 1057 07/15/23 0849 07/16/23 1006 07/16/23 1354 07/17/23 1244  WBC 20.4* 15.9* 12.6* 14.9* 12.0*  NEUTROABS 17.7* 13.7* 10.5*  --  9.2*  HGB 8.4* 7.8* 8.3* 7.6* 8.7*  HCT 27.0* 25.3* 26.6* 24.9* 27.9*  MCV 92.2 93.0 91.7 92.9 90.9  PLT 465* 451* 474* 470* 476*   Cardiac Enzymes: No results for input(s): "CKTOTAL", "CKMB", "CKMBINDEX", "TROPONINI" in the last 168 hours. CBG: Recent Labs  Lab 07/14/23 0100 07/16/23 2052 07/17/23 1657  GLUCAP 137* 151* 161*    Iron  Studies: No results for input(s): "IRON ", "TIBC", "TRANSFERRIN", "FERRITIN" in the last 72 hours. Studies/Results: No results found.    apixaban   5 mg Oral BID   B-complex with vitamin C  1 tablet Oral Daily   bupivacaine (PF)  10 mL Infiltration Once   bupivacaine (PF)  10 mL Infiltration Once   Chlorhexidine  Gluconate Cloth  6 each Topical Q12H   cholecalciferol   1,000 Units Oral Daily   colestipol   1 g Oral TID AC   darbepoetin (ARANESP ) injection -  DIALYSIS  200 mcg Subcutaneous Q Sat-1800   diclofenac  Sodium  2 g Topical QID   FLUoxetine   20 mg Oral Daily   gabapentin   300 mg Oral QHS   l-methylfolate-B6-B12  1 tablet Oral Daily   lipase/protease/amylase  36,000 Units Oral TID AC   memantine   5 mg Oral Daily   mirtazapine   7.5 mg Oral QHS   multivitamin  1 tablet Oral QHS   pantoprazole   40 mg Oral BID   rivastigmine   1.5 mg Oral BID   sevelamer  carbonate  1,600 mg Oral TID WC   torsemide   100 mg Oral Q M,W,F   And   torsemide   100 mg Oral Q Sun    BMET    Component Value Date/Time   NA 130 (L) 07/16/2023 1354   NA 144 07/19/2020 1458   NA 140 03/14/2017 0810   K 4.0 07/16/2023 1354   K 3.8 03/14/2017 0810   CL 92 (L) 07/16/2023 1354   CO2 19 (L) 07/16/2023 1354   CO2 27 03/14/2017 0810   GLUCOSE 153 (H) 07/16/2023 1354   GLUCOSE 177 (H) 03/14/2017 0810   BUN 94 (H) 07/16/2023 1354   BUN 85 (HH) 07/19/2020 1458   BUN 35.6 (H) 03/14/2017 0810   CREATININE 8.11 (H) 07/16/2023 1354   CREATININE 2.31 (H) 10/25/2022 1355   CREATININE 1.8 (H) 03/14/2017 0810   CALCIUM  9.5 07/16/2023 1354   CALCIUM  8.3 (L) 06/18/2023 0334   CALCIUM  9.6 03/14/2017 0810   GFRNONAA 5 (L) 07/16/2023 1354   GFRNONAA 22 (L) 10/25/2022 1355   GFRNONAA 45 (L) 12/17/2013 1158   GFRAA 21 (L) 12/21/2019 0845   GFRAA 52 (L) 12/17/2013 1158   CBC    Component Value Date/Time   WBC 12.0 (H) 07/17/2023 1244   RBC 3.07 (L) 07/17/2023 1244   HGB 8.7 (L) 07/17/2023 1244   HGB 9.4 (L) 04/18/2023 1434   HGB 9.3 (L) 03/14/2017 0810   HCT 27.9 (L) 07/17/2023 1244   HCT 26.4 (L) 07/21/2019 0809   HCT 29.2 (L) 03/14/2017 0810   PLT 476 (H) 07/17/2023 1244   PLT 235 04/18/2023 1434   PLT 292 03/14/2017 0810   PLT 351 10/11/2016 1139   MCV 90.9 07/17/2023 1244   MCV 81.3 03/14/2017 0810   MCH 28.3 07/17/2023 1244   MCHC 31.2 07/17/2023 1244   RDW 16.7 (H) 07/17/2023 1244   RDW 14.0 03/14/2017 0810   LYMPHSABS 1.3 07/17/2023 1244   LYMPHSABS  1.4 03/14/2017 0810   MONOABS 0.9 07/17/2023 1244   MONOABS 0.4 03/14/2017 0810   EOSABS 0.1 07/17/2023  1244   EOSABS 0.1 03/14/2017 0810   BASOSABS 0.1 07/17/2023 1244   BASOSABS 0.0 03/14/2017 0810

## 2023-07-18 NOTE — Progress Notes (Signed)
 Occupational Therapy Session Note  Patient Details  Name: [name] MRN: [id number] Date of Birth: [birth year]  Today's Date: 07/18/2023 OT Individual Time: 7829-5621 OT Individual Time Calculation (min): 59 min     Skilled Therapeutic Interventions/Progress Updates: Patient received sitting up in w/c with ice on R Knee. Patient agreeable to OT treatment for UE strength and endurance. Assisted patient to therapy gym for functional reaching activity at table top working on LUE reaching to eye level with distal assist. RUE with no limitations in AROM. Support provided to Left elbow during repetitive reaching activity to work towards reach against gravity. No pain with activity in the LUE. Continued treatment with 1lb dowel used for BUE strength and AROM exercises. Performed sets of 10 in varied planes with therapist support on the L side as needed. Patient is motivated to participate and wants to improve functional mobility and strength but feels very limited at this time with the knee pain. Continue with skilled OT POC working on gaining functional independence with BADL's     Therapy Documentation Precautions:  Precautions Precautions: Fall Precaution/Restrictions Comments: dementia Restrictions Weight Bearing Restrictions Per Provider Order: No    Pain:Patient reporting severe pain in Rknee. Patient agreeable to participate and reports having medication recently.        Therapy/Group: Individual Therapy  Marty Sleet 07/18/2023, 12:34 PM

## 2023-07-18 NOTE — Progress Notes (Signed)
 Patient ID: [name], female   DOB: 12/27/1945, 78 y.o.   MRN: [id number]  [facility]; resumption orders will be needed.   Norval Been, MSW, LCSW Office: [phone] Cell: [phone] Fax: [phone]

## 2023-07-18 NOTE — Progress Notes (Signed)
 Per nursing, patient found on the floor. No reports of injury or pain and cognition at baseline. Neuro checks ordered. CT head ordered.  Telesitter ordered for safety observation.

## 2023-07-18 NOTE — Procedures (Signed)
 Received patient in bed to unit.  Alert and oriented.  Informed consent signed and in chart.   TX duration: 3.5 hours  Patient tolerated well.  Transported back to the room  Alert, without acute distress.  Hand-off given to patient's nurse.   Access used: right cath Access issues: none  Total UF removed: 1500 ml Medication(s) given: vancomycin    Clover Dao, RN [facility]

## 2023-07-18 NOTE — Procedures (Deleted)
 Received patient in bed to unit.  Alert and oriented.  Informed consent signed and in chart.   TX duration: 4 hours  Patient tolerated well.  Transported back to the room  Alert, without acute distress.  Hand-off given to patient's nurse.   Access used: right cath Access issues: none  Total UF removed: 2 liters Medication(s) given: none  Clover Dao, RN [facility]

## 2023-07-18 NOTE — Progress Notes (Signed)
 [facility]  Date of Admission:  07/08/2023     Reason for Follow Up: Suspected septic arthritis of the right knee  Total days of antibiotics 23         ASSESSMENT:  [name] appears to have increased effusion and tenderness of the right knee in the setting of suspected native septic arthritis. Plan for debridement tomorrow.  Synovial specimens from the right knee and right toe along with blood cultures remain without growth to date.  Discussed plan of care to continue with current dose of vancomycin  and ceftriaxone  for broad-spectrum coverage and narrow antibiotics as able pending culture results.  Appreciate any additional cultures orthopedics may be able to obtain during surgery although likeliness of the yield is low secondary to being on broad-spectrum antibiotics prior to surgery.  Continue therapeutic drug monitoring of vancomycin  levels.  Standard/universal precautions.  Remaining medical and supportive care per primary team.  PLAN:  Continue broad-spectrum coverage with vancomycin  and ceftriaxone . Surgical debridement planned for tomorrow per orthopedics. Monitor synovial and blood cultures for organisms and adjust antibiotics as appropriate. Therapeutic drug monitoring of vancomycin  levels. Standard/universal precautions. Remaining medical and supportive care per primary team.  Principal Problem:   Debility Active Problems:   Septic arthritis (HCC)   Pseudogout    apixaban   5 mg Oral BID   B-complex with vitamin C  1 tablet Oral Daily   bupivacaine (PF)  10 mL Infiltration Once   bupivacaine (PF)  10 mL Infiltration Once   Chlorhexidine  Gluconate Cloth  6 each Topical Q12H   cholecalciferol   1,000 Units Oral Daily   colestipol   1 g Oral TID AC   darbepoetin (ARANESP ) injection - DIALYSIS  200 mcg Subcutaneous Q Sat-1800   diclofenac  Sodium  2 g Topical QID   FLUoxetine   20 mg Oral Daily   gabapentin   300 mg Oral QHS    l-methylfolate-B6-B12  1 tablet Oral Daily   lipase/protease/amylase  36,000 Units Oral TID AC   memantine   5 mg Oral Daily   mirtazapine   7.5 mg Oral QHS   multivitamin  1 tablet Oral QHS   pantoprazole   40 mg Oral BID   rivastigmine   1.5 mg Oral BID   sevelamer  carbonate  1,600 mg Oral TID WC   torsemide   100 mg Oral Q M,W,F   And   torsemide   100 mg Oral Q Sun    SUBJECTIVE:  Afebrile overnight with no acute events. Continues to have right knee pain. Seen while on dialysis. Tolerating antibiotics with no adverse side effects.  Allergies  Allergen Reactions   Anesthetics, Halogenated Other (See Comments)    Per patient's daughter, she can not tolerate anesthetics.    Etomidate  Other (See Comments)    Heart stopped during anesthesia   Fentanyl  Other (See Comments)    Heart stopped during anesthesia    Percocet [Oxycodone -Acetaminophen ] Other (See Comments)    Headaches    Diazepam Other (See Comments)    Agitation   Lorazepam  Nausea Only   Tramadol  Hcl Nausea And Vomiting   Haloperidol Nausea And Vomiting    **Haldol**   Morphine  Sulfate Nausea Only    agitation   Propoxyphene Nausea And Vomiting    **Darvocet** no longer available   Versed  [Midazolam ] Other (See Comments)    Heart stopped during anesthesia     Review of Systems: Review of Systems  Constitutional:  Negative for chills, fever and weight loss.  Respiratory:  Negative for cough, shortness  of breath and wheezing.   Cardiovascular:  Negative for chest pain and leg swelling.  Gastrointestinal:  Negative for abdominal pain, constipation, diarrhea, nausea and vomiting.  Musculoskeletal:        Right knee pain  Skin:  Negative for rash.      OBJECTIVE: Vitals:   07/18/23 1449 07/18/23 1500 07/18/23 1530 07/18/23 1600  BP: (!) 155/71 (!) 183/82 (!) 172/86 (!) 164/85  Pulse: 76 78 75 77  Resp: 15 13 13 11   Temp:      TempSrc:      SpO2: 100% 100% 100% 100%  Weight:      Height:       Body  mass index is 28.04 kg/m.  Physical Exam Constitutional:      General: She is not in acute distress.    Appearance: She is well-developed.  Cardiovascular:     Rate and Rhythm: Normal rate and regular rhythm.     Heart sounds: Normal heart sounds.  Pulmonary:     Effort: Pulmonary effort is normal.     Breath sounds: Normal breath sounds.  Musculoskeletal:     Comments: Right knee with increased effusion and generalized tenderness.  Skin:    General: Skin is warm and dry.  Neurological:     Mental Status: She is alert and oriented to person, place, and time.  Psychiatric:        Mood and Affect: Mood normal.     Lab Results Lab Results  Component Value Date   WBC 11.6 (H) 07/18/2023   HGB 8.2 (L) 07/18/2023   HCT 27.4 (L) 07/18/2023   MCV 92.6 07/18/2023   PLT 520 (H) 07/18/2023    Lab Results  Component Value Date   CREATININE 6.72 (H) 07/18/2023   BUN 72 (H) 07/18/2023   NA 135 07/18/2023   K 4.2 07/18/2023   CL 92 (L) 07/18/2023   CO2 23 07/18/2023    Lab Results  Component Value Date   ALT 48 (H) 07/13/2023   AST 47 (H) 07/13/2023   ALKPHOS 222 (H) 07/13/2023   BILITOT 0.7 07/13/2023     Microbiology: Recent Results (from the past 240 hours)  Culture, blood (Routine X 2) w Reflex to ID Panel     Status: None (Preliminary result)   Collection Time: 07/14/23 12:45 PM   Specimen: BLOOD LEFT HAND  Result Value Ref Range Status   Specimen Description BLOOD LEFT HAND  Final   Special Requests   Final    AEROBIC BOTTLE ONLY Blood Culture results may not be optimal due to an inadequate volume of blood received in culture bottles   Culture   Final    NO GROWTH 4 DAYS Performed at [facility], [address], [place], [place] [address]    Report Status PENDING  Incomplete  Culture, blood (Routine X 2) w Reflex to ID Panel     Status: None (Preliminary result)   Collection Time: 07/14/23 12:54 PM   Specimen: BLOOD RIGHT HAND  Result Value Ref Range  Status   Specimen Description BLOOD RIGHT HAND  Final   Special Requests   Final    AEROBIC BOTTLE ONLY Blood Culture results may not be optimal due to an inadequate volume of blood received in culture bottles   Culture   Final    NO GROWTH 4 DAYS Performed at [facility], [address], [place], [place] [address]    Report Status PENDING  Incomplete  Body fluid culture w Gram Stain     Status: None (Preliminary result)   Collection Time: 07/15/23 12:00 PM   Specimen: Synovium  Result Value Ref Range Status   Specimen Description SYNOVIAL  Final   Special Requests RIGHT KNEE  Final   Gram Stain   Final    ABUNDANT WBC PRESENT, PREDOMINANTLY PMN NO ORGANISMS SEEN    Culture   Final    NO GROWTH 2 DAYS Performed at [facility], [address] [place], [place] [address]    Report Status PENDING  Incomplete  Body fluid culture w Gram Stain     Status: None (Preliminary result)   Collection Time: 07/15/23  6:00 PM   Specimen: Synovium  Result Value Ref Range Status   Specimen Description SYNOVIAL  Final   Special Requests RIGHT TOE  Final   Gram Stain   Final    ABUNDANT WBC PRESENT, PREDOMINANTLY PMN NO ORGANISMS SEEN    Culture   Final    NO GROWTH 2 DAYS Performed at [facility], [address], [place], [place] [address]    Report Status PENDING  Incomplete    I have personally spent 24 minutes involved in face-to-face and non-face-to-face activities for this patient on the day of the visit. Professional time spent includes the following activities: Preparing to see the patient (review of tests), Obtaining and/or reviewing separately obtained history (admission/discharge record), Performing a medically appropriate examination and/or evaluation , Ordering medications/tests/procedures, referring and communicating with other health care professionals, Documenting clinical information in the EMR, Independently interpreting results (not separately reported),  Communicating results to the patient/family/caregiver, Counseling and educating the patient/family/caregiver and Care coordination (not separately reported).   Marlan Silva, NP [facility]  07/18/2023  4:29 PM

## 2023-07-18 NOTE — Progress Notes (Signed)
 [facility]    Date of Admission:  07/08/2023     ID: [name] is a 78 y.o. female with ESRD recently being treated for LUE cellulitis/abscess after failed AV fistula s/p explanted. Now with pseudogout to knees bilaterally but concern for possible right knee septic arhritis Principal Problem:   Debility    Subjective: She subscribeds to having a fever last night. Still finds her right knee much more painful than left knee. Awaiting to get washout of right knee by dr. Hulda Mage. Tolerating HD without issues  Medications:   apixaban   5 mg Oral BID   B-complex with vitamin C  1 tablet Oral Daily   bupivacaine (PF)  10 mL Infiltration Once   bupivacaine (PF)  10 mL Infiltration Once   Chlorhexidine  Gluconate Cloth  6 each Topical Q12H   cholecalciferol   1,000 Units Oral Daily   colestipol   1 g Oral TID AC   darbepoetin (ARANESP ) injection - DIALYSIS  200 mcg Subcutaneous Q Sat-1800   diclofenac  Sodium  2 g Topical QID   FLUoxetine   20 mg Oral Daily   gabapentin   300 mg Oral QHS   l-methylfolate-B6-B12  1 tablet Oral Daily   lipase/protease/amylase  36,000 Units Oral TID AC   memantine   5 mg Oral Daily   mirtazapine   7.5 mg Oral QHS   multivitamin  1 tablet Oral QHS   pantoprazole   40 mg Oral BID   rivastigmine   1.5 mg Oral BID   sevelamer  carbonate  1,600 mg Oral TID WC   torsemide   100 mg Oral Q M,W,F   And   torsemide   100 mg Oral Q Sun    Objective: Vital signs in last 24 hours: Temp:  [97.9 F (36.6 C)-98.3 F (36.8 C)] 97.9 F (36.6 C) (05/01 1429) Pulse Rate:  [75-80] 75 (05/01 1530) Resp:  [13-16] 13 (05/01 1530) BP: (133-183)/(57-86) 172/86 (05/01 1530) SpO2:  [98 %-100 %] 100 % (05/01 1530) Weight:  [70.7 kg-74.1 kg] 74.1 kg (05/01 1429)  Physical Exam  Constitutional:  oriented to person, place, and time. appears well-developed and well-nourished. No distress.  HENT: [place]/AT, PERRLA, no scleral icterus Mouth/Throat:  Oropharynx is clear and moist. No oropharyngeal exudate.  Cardiovascular: Normal rate, regular rhythm and normal heart sounds. Exam reveals no gallop and no friction rub.  No murmur heard.  Chest wall = hd line is non tender Pulmonary/Chest: Effort normal and breath sounds normal. No respiratory distress.  has no wheezes.  Neck = supple, no nuchal rigidity Abdominal: Soft. Bowel sounds are normal.  exhibits no distension. There is no tenderness.  Lymphadenopathy: no cervical adenopathy. No axillary adenopathy Neurological: alert and oriented to person, place, and time.  Skin: Skin is warm and dry. No rash noted. No erythema.  Psychiatric: a normal mood and affect.  behavior is normal.    Lab Results Recent Labs    07/16/23 1354 07/17/23 1244 07/18/23 1045  WBC 14.9* 12.0* 11.6*  HGB 7.6* 8.7* 8.2*  HCT 24.9* 27.9* 27.4*  NA 130*  --  135  K 4.0  --  4.2  CL 92*  --  92*  CO2 19*  --  23  BUN 94*  --  72*  CREATININE 8.11*  --  6.72*   Liver Panel Recent Labs    07/16/23 1354 07/18/23 1045  ALBUMIN  2.0* 2.4*   Sedimentation Rate No results for input(s): "ESRSEDRATE" in the last 72 hours. C-Reactive Protein No results for input(s): "  CRP" in the last 72 hours.  Microbiology: reviewed Studies/Results: No results found.   Assessment/Plan: Presumptive right knee septic arthritis = continue with vancomycin  and ceftriaxone . Cultures from aspirate are negative. Appreciate that she is getting a washout of right knee. Will follow up on cultures. Please resend further specimen  Hx of LUE abscess = current abtx to cover. Currently on day 14 of coverage since explant of vascular access.  Esrd on hd = continue to renally dose abtx  I have personally spent 50 minutes involved in face-to-face and non-face-to-face activities for this patient on the day of the visit. Professional time spent includes the following activities: Preparing to see the patient (review of tests), Obtaining  and/or reviewing separately obtained history (admission/discharge record), Performing a medically appropriate examination and/or evaluation , Ordering medications/tests/procedures, referring and communicating with other health care professionals, Documenting clinical information in the EMR, Independently interpreting results (not separately reported), Communicating results to the patient/family/caregiver, Counseling and educating the patient/family/caregiver and Care coordination (not separately reported).     [facility] for Infect[facility] Diseases Pager: [phone]  07/18/2023, 3:58 PM

## 2023-07-18 NOTE — Progress Notes (Signed)
 Physical Therapy Session Note  Patient Details  Name: [name] MRN: [id number] Date of Birth: May 17, 1945  Today's Date: 07/18/2023 PT Individual Time: [phone] and 1100-1155 PT Individual Time Calculation (min): 70 min and 55 min  Short Term Goals: Week 1:  PT Short Term Goal 1 (Week 1): pt will transfer bed<>chair with LRAD and CGA PT Short Term Goal 1 - Progress (Week 1): Progressing toward goal PT Short Term Goal 2 (Week 1): pt will transfer sit<>stand with LRAD and CGA PT Short Term Goal 2 - Progress (Week 1): Progressing toward goal PT Short Term Goal 3 (Week 1): pt will ambulate 25ft with LRAD and CGA PT Short Term Goal 3 - Progress (Week 1): Progressing toward goal Week 2:  PT Short Term Goal 1 (Week 2): STG=LTG due to LOS  Skilled Therapeutic Interventions/Progress Updates:  Treatment Session 1  Received pt semi-reclined in bed, pt agreeable to PT treatment, and reported pain in R knee and rubbing for relief - RN notified and requested pt get OOB into WC first, reporting pt ws extremly lethargic earlier this morning. Pt with poor understanding of pain scale, reporting 3/10 pain but previously told RN pain was 10/10 and pain severely limiting mobility (pt stating "my knees hurt" constantly throughout session). Session with emphasis on functional mobility/transfers, dressing, toileting, generalized strengthening and endurance, and sanding balance/tolerance. Donned R knee sleeve with max A and pt transferred supine<>sitting R EOB from flat bed with min A for LLE management with heavy reliance on bedrails, encouragement, increased time, and cues for sequencing.  Donned socks, shoes, and scrub pants seated with max A. Pt doffed dirty gown and donned pull over shirt with supervision and increased time while RN administered medications. Pt required mod A to don sweatshirt, then stood from EOB with RW and mod A (cues for hand placement) and performed stand<>pivot into WC with RW and  min A (cues to reach back prior to sitting). Pt reported urge to void and transferred to/from toilet with bedside commode over top with min A (mod/max A to stand from [facility] and min A from bedside commode). Pt required max A for clothing management and able to void and perform hygiene management without assist.   Sat in WC at sink and washed face, brushed teeth, applied deodorant, and applied Vaseline with set up assist. Pt then requested to change pants. Stood at sink with mod A, removed scrub pants, donned other pants with max A, then stood again with mod A to pull pants over hips. Nephrology MD arrived briefly, then pt transported to/from room in [facility] dependently for time management purposes. Pt performed seated BLE strengthening on Kinetron at 20 cm/sec for 5 minutes with emphasis on glute and quad strength and knee flexion ROM. Concluded session with pt sitting in WC, needs within reach, and seatbelt alarm on - ice pack provided for R knee for pain relief. Of note, pt continues to be limited by bilateral knee pain R>L, fluctuates with mobility (ranging from min/heavy mod A with transfers), and is inconsistent with recall of cues for sequencing and safety with transfers.   Treatment Session 2  Received pt sitting in [facility] with NT assisting pt to bathroom - PT took over with care. Pt agreeable to PT treatment and reported continued pain in bilateral knees (R>L). Session with emphasis on functional mobility/transfers, toileting, generalized strengthening and endurance, dynamic standing balance/coordination, and ambulation. Pt transferred to/from toilet with bedside commode over top with min A (mod A to  stand from [facility] and min A from bedside commode). Pt required max A for clothing management and able to void and perform hygiene management without assist. Sat in WC and washed hands with set up assist and RN administered medications.   Pt transported to/from room in [facility] dependently for time management purposes. Stood  from [facility] with RW and mod A x 2 trials and ambulated 63ft x 2 trials with RW and min A with close WC follow with target of reaching cone placed 75ft away. Pt ultimately limited by knee pain and continues to stand with bilateral knees flexed in stance and unable to extend despite max cues. Stood at staircase with mod A (cues to push up from [facility] armrests) and attempted alternating toe taps to 3in step, however pt unable to complete. Instead stepped up/down 1 3in step with mod A and heavy reliance on handrails (placing elbows onto rails) with severe knee flexion. Pt with poor insight into deficits, thinking she will be able to complete steps at home. Extensive discussion has regarding pain and safety concerns with steps with recommendation for ramp. Returned to room and concluded session with pt sitting in WC, needs within reach, and seatbelt alarm on.   Therapy Documentation Precautions:  Precautions Precautions: Fall Precaution/Restrictions Comments: dementia Restrictions Weight Bearing Restrictions Per Provider Order: No  Therapy/Group: Individual Therapy [name] Nena Bank PT, DPT 07/18/2023, 6:54 AM

## 2023-07-19 ENCOUNTER — Encounter (HOSPITAL_COMMUNITY): Payer: Self-pay | Admitting: Physical Medicine and Rehabilitation

## 2023-07-19 ENCOUNTER — Encounter (HOSPITAL_COMMUNITY)
Admission: AD | Disposition: A | Discharge status: Skilled Nursing Facility | Payer: Self-pay | Source: Intra-hospital | Attending: Physical Medicine and Rehabilitation

## 2023-07-19 ENCOUNTER — Inpatient Hospital Stay (HOSPITAL_COMMUNITY): Payer: Self-pay | Admitting: Certified Registered Nurse Anesthetist

## 2023-07-19 ENCOUNTER — Other Ambulatory Visit: Payer: Self-pay

## 2023-07-19 DIAGNOSIS — I2510 Atherosclerotic heart disease of native coronary artery without angina pectoris: Secondary | ICD-10-CM

## 2023-07-19 DIAGNOSIS — I110 Hypertensive heart disease with heart failure: Secondary | ICD-10-CM

## 2023-07-19 DIAGNOSIS — T8453XA Infection and inflammatory reaction due to internal right knee prosthesis, initial encounter: Secondary | ICD-10-CM

## 2023-07-19 DIAGNOSIS — I509 Heart failure, unspecified: Secondary | ICD-10-CM

## 2023-07-19 DIAGNOSIS — I6782 Cerebral ischemia: Secondary | ICD-10-CM | POA: Diagnosis not present

## 2023-07-19 HISTORY — PX: IRRIGATION AND DEBRIDEMENT KNEE: SHX5185

## 2023-07-19 LAB — CULTURE, BLOOD (ROUTINE X 2)
Culture: NO GROWTH
Culture: NO GROWTH

## 2023-07-19 LAB — GLUCOSE, CAPILLARY
Glucose-Capillary: 194 mg/dL — ABNORMAL HIGH (ref 70–99)
Glucose-Capillary: 92 mg/dL (ref 70–99)
Glucose-Capillary: 106 mg/dL — ABNORMAL HIGH (ref 70–99)

## 2023-07-19 LAB — SURGICAL PCR SCREEN
MRSA, PCR: NEGATIVE
Staphylococcus aureus: NEGATIVE

## 2023-07-19 MED ORDER — ONDANSETRON HCL 4 MG/2ML IJ SOLN
INTRAMUSCULAR | Status: AC
Start: 2023-07-19 — End: ?
  Filled 2023-07-19: qty 2

## 2023-07-19 MED ORDER — FENTANYL CITRATE (PF) 250 MCG/5ML IJ SOLN
INTRAMUSCULAR | Status: DC | PRN
  Administered 2023-07-19 (×3): 25 ug via INTRAVENOUS

## 2023-07-19 MED ORDER — TOBRAMYCIN SULFATE 1.2 G IJ SOLR
INTRAMUSCULAR | Status: AC
  Filled 2023-07-19: qty 1.2

## 2023-07-19 MED ORDER — ORAL CARE MOUTH RINSE: 15.0000 mL | Freq: Once | OROMUCOSAL | Status: AC

## 2023-07-19 MED ORDER — BACITRACIN ZINC 500 UNIT/GM EX OINT
TOPICAL_OINTMENT | CUTANEOUS | Status: AC
  Filled 2023-07-19: qty 28.35

## 2023-07-19 MED ORDER — FENTANYL CITRATE (PF) 100 MCG/2ML IJ SOLN
INTRAMUSCULAR | Status: AC
  Filled 2023-07-19: qty 2

## 2023-07-19 MED ORDER — POLYETHYLENE GLYCOL 3350 17 G PO PACK
17.0000 g | PACK | Freq: Every day | ORAL | Status: DC | PRN
  Administered 2023-07-26: 17 g via ORAL
  Filled 2023-07-19: qty 1

## 2023-07-19 MED ORDER — POVIDONE-IODINE 10 % EX SWAB
2.0000 | Freq: Once | CUTANEOUS | Status: AC
  Administered 2023-07-19: 2 via TOPICAL

## 2023-07-19 MED ORDER — VANCOMYCIN HCL 1000 MG IV SOLR
INTRAVENOUS | Status: AC
  Filled 2023-07-19: qty 20

## 2023-07-19 MED ORDER — VANCOMYCIN HCL 1000 MG IV SOLR
INTRAVENOUS | Status: DC | PRN
  Administered 2023-07-19: 1000 mg

## 2023-07-19 MED ORDER — FENTANYL CITRATE (PF) 100 MCG/2ML IJ SOLN
INTRAMUSCULAR | Status: AC
Start: 2023-07-19 — End: 2023-07-19
  Filled 2023-07-19: qty 2

## 2023-07-19 MED ORDER — DEXAMETHASONE SODIUM PHOSPHATE 10 MG/ML IJ SOLN
INTRAMUSCULAR | Status: AC
Start: 2023-07-19 — End: ?
  Filled 2023-07-19: qty 1

## 2023-07-19 MED ORDER — CHLORHEXIDINE GLUCONATE 0.12 % MT SOLN
15.0000 mL | Freq: Once | OROMUCOSAL | Status: AC
  Administered 2023-07-19: 15 mL via OROMUCOSAL

## 2023-07-19 MED ORDER — SODIUM CHLORIDE 0.9 % IV SOLN: INTRAVENOUS | Status: DC

## 2023-07-19 MED ORDER — PROPOFOL 10 MG/ML IV BOLUS
INTRAVENOUS | Status: AC
  Filled 2023-07-19: qty 20

## 2023-07-19 MED ORDER — DOCUSATE SODIUM 100 MG PO CAPS
100.0000 mg | ORAL_CAPSULE | Freq: Two times a day (BID) | ORAL | Status: DC
  Administered 2023-07-19 – 2023-07-25 (×12): 100 mg via ORAL
  Filled 2023-07-19 (×12): qty 1

## 2023-07-19 MED ORDER — PROPOFOL 10 MG/ML IV BOLUS
INTRAVENOUS | Status: DC | PRN
  Administered 2023-07-19: 50 mg via INTRAVENOUS
  Administered 2023-07-19: 90 mg via INTRAVENOUS

## 2023-07-19 MED ORDER — FENTANYL CITRATE (PF) 250 MCG/5ML IJ SOLN
INTRAMUSCULAR | Status: AC
  Filled 2023-07-19: qty 5

## 2023-07-19 MED ORDER — CHLORHEXIDINE GLUCONATE 4 % EX SOLN: 60.0000 mL | Freq: Once | CUTANEOUS | Status: DC

## 2023-07-19 MED ORDER — KIDNEY FAILURE BOOK
Freq: Once | Status: DC
  Filled 2023-07-19: qty 1

## 2023-07-19 MED ORDER — SODIUM CHLORIDE 0.9 % IV SOLN: INTRAVENOUS | Status: AC

## 2023-07-19 MED ORDER — FENTANYL CITRATE (PF) 100 MCG/2ML IJ SOLN
25.0000 ug | INTRAMUSCULAR | Status: AC | PRN
Start: 2023-07-19 — End: 2023-07-19
  Administered 2023-07-19 (×2): 25 ug via INTRAVENOUS
  Administered 2023-07-19: 50 ug via INTRAVENOUS
  Administered 2023-07-19: 25 ug via INTRAVENOUS
  Administered 2023-07-19: 50 ug via INTRAVENOUS
  Administered 2023-07-19: 25 ug via INTRAVENOUS

## 2023-07-19 MED ORDER — ONDANSETRON HCL 4 MG/2ML IJ SOLN
INTRAMUSCULAR | Status: DC | PRN
  Administered 2023-07-19: 4 mg via INTRAVENOUS

## 2023-07-19 MED ORDER — LIDOCAINE 2% (20 MG/ML) 5 ML SYRINGE
INTRAMUSCULAR | Status: DC | PRN
  Administered 2023-07-19: 40 mg via INTRAVENOUS

## 2023-07-19 MED ORDER — CEFAZOLIN SODIUM-DEXTROSE 2-4 GM/100ML-% IV SOLN
2.0000 g | INTRAVENOUS | Status: AC
  Administered 2023-07-19: 2 g via INTRAVENOUS
  Filled 2023-07-19: qty 100

## 2023-07-19 MED ORDER — CHLORHEXIDINE GLUCONATE 0.12 % MT SOLN
OROMUCOSAL | Status: AC
  Filled 2023-07-19: qty 15

## 2023-07-19 MED ORDER — 0.9 % SODIUM CHLORIDE (POUR BTL) OPTIME
TOPICAL | Status: DC | PRN
  Administered 2023-07-19: 1000 mL

## 2023-07-19 MED ORDER — TOBRAMYCIN SULFATE 1.2 G IJ SOLR
INTRAMUSCULAR | Status: DC | PRN
  Administered 2023-07-19: 1.2 g

## 2023-07-19 MED ORDER — DEXAMETHASONE SODIUM PHOSPHATE 10 MG/ML IJ SOLN
INTRAMUSCULAR | Status: DC | PRN
  Administered 2023-07-19: 5 mg via INTRAVENOUS

## 2023-07-19 SURGICAL SUPPLY — 40 items
BAG COUNTER SPONGE SURGICOUNT (BAG) ×1 IMPLANT
BNDG COHESIVE 4X5 TAN STRL LF (GAUZE/BANDAGES/DRESSINGS) ×1 IMPLANT
BNDG GAUZE DERMACEA FLUFF 4 (GAUZE/BANDAGES/DRESSINGS) ×2 IMPLANT
BRUSH SCRUB EZ PLAIN DRY (MISCELLANEOUS) ×2 IMPLANT
CHLORAPREP W/TINT 26 (MISCELLANEOUS) ×1 IMPLANT
COVER MAYO STAND STRL (DRAPES) ×1 IMPLANT
COVER SURGICAL LIGHT HANDLE (MISCELLANEOUS) ×2 IMPLANT
DRAPE SURG 17X23 STRL (DRAPES) ×1 IMPLANT
DRAPE SURG ORHT 6 SPLT 77X108 (DRAPES) ×1 IMPLANT
DRAPE U-SHAPE 47X51 STRL (DRAPES) ×1 IMPLANT
DRESSING MEPILEX FLEX 4X4 (GAUZE/BANDAGES/DRESSINGS) IMPLANT
DRSG ADAPTIC 3X8 NADH LF (GAUZE/BANDAGES/DRESSINGS) ×1 IMPLANT
ELECTRODE REM PT RTRN 9FT ADLT (ELECTROSURGICAL) IMPLANT
EVACUATOR 1/8 PVC DRAIN (DRAIN) IMPLANT
GAUZE SPONGE 4X4 12PLY STRL (GAUZE/BANDAGES/DRESSINGS) ×1 IMPLANT
GLOVE BIO SURGEON STRL SZ 6.5 (GLOVE) ×3 IMPLANT
GLOVE BIO SURGEON STRL SZ7.5 (GLOVE) ×4 IMPLANT
GLOVE BIOGEL PI IND STRL 6.5 (GLOVE) ×1 IMPLANT
GLOVE BIOGEL PI IND STRL 7.5 (GLOVE) ×1 IMPLANT
GOWN STRL REUS W/ TWL LRG LVL3 (GOWN DISPOSABLE) ×2 IMPLANT
KIT BASIN OR (CUSTOM PROCEDURE TRAY) ×1 IMPLANT
KIT TURNOVER KIT B (KITS) ×1 IMPLANT
MANIFOLD NEPTUNE II (INSTRUMENTS) ×1 IMPLANT
NS IRRIG 1000ML POUR BTL (IV SOLUTION) ×1 IMPLANT
PACK ORTHO EXTREMITY (CUSTOM PROCEDURE TRAY) ×1 IMPLANT
PAD ARMBOARD POSITIONER FOAM (MISCELLANEOUS) ×2 IMPLANT
PADDING CAST COTTON 6X4 STRL (CAST SUPPLIES) ×1 IMPLANT
SET HNDPC FAN SPRY TIP SCT (DISPOSABLE) IMPLANT
SPONGE T-LAP 18X18 ~~LOC~~+RFID (SPONGE) ×1 IMPLANT
SUT ETHILON 2 0 FS 18 (SUTURE) ×2 IMPLANT
SUT ETHILON 3 0 PS 1 (SUTURE) ×2 IMPLANT
SUT MON AB 2-0 CT1 36 (SUTURE) ×1 IMPLANT
SUT PDS AB 0 CT 36 (SUTURE) IMPLANT
SWAB CULTURE ESWAB REG 1ML (MISCELLANEOUS) IMPLANT
TOWEL GREEN STERILE (TOWEL DISPOSABLE) ×2 IMPLANT
TOWEL GREEN STERILE FF (TOWEL DISPOSABLE) ×1 IMPLANT
TUBE CONNECTING 12X1/4 (SUCTIONS) ×1 IMPLANT
UNDERPAD 30X36 HEAVY ABSORB (UNDERPADS AND DIAPERS) ×1 IMPLANT
WATER STERILE IRR 1000ML POUR (IV SOLUTION) ×1 IMPLANT
YANKAUER SUCT BULB TIP NO VENT (SUCTIONS) ×1 IMPLANT

## 2023-07-19 NOTE — Transfer of Care (Signed)
 Immediate Anesthesia Transfer of Care Note  Patient: [name]  Procedure(s) Performed: IRRIGATION AND DEBRIDEMENT KNEE (Right: Knee)  Patient Location: PACU  Anesthesia Type:General  Level of Consciousness: awake, alert , and oriented  Airway & Oxygen Therapy: Patient connected to face mask oxygen  Post-op Assessment: Post -op Vital signs reviewed and stable  Post vital signs: stable  Last Vitals:  Vitals Value Taken Time  BP 162/63 07/19/23 0835  Temp    Pulse 83 07/19/23 0837  Resp 15 07/19/23 0837  SpO2 100 % 07/19/23 0837  Vitals shown include unfiled device data.  Last Pain:  Vitals:   07/19/23 0653  TempSrc: (P) Oral  PainSc:       Patients Stated Pain Goal: 0 (07/12/23 2130)  Complications: No notable events documented.

## 2023-07-19 NOTE — Progress Notes (Signed)
 Transported to surgery via stretcher, daughter Raoul Byes present

## 2023-07-19 NOTE — Progress Notes (Signed)
 PROGRESS NOTE   Subjective/Complaints: She is feeling well after surgery today- she is cleared for therapy She is moving closer to nursing station due to fall risk  ROS: +upper and lower extremity weakness- improved, +bowel incontinence, +right knee pain-limits therapy, continues but is betterr. Denies CP, SOB, abd pain, n/v/d/c, +dizziness   Objective:   CT HEAD WO CONTRAST ( ) Result Date: 07/19/2023 EXAM: CT HEAD WITHOUT 07/19/2023 12:01:08 AM TECHNIQUE: CT of the head was performed without the administration of intravenous contrast. Automated exposure control, iterative reconstruction, and/or weight based adjustment of the mA/kV was utilized to reduce the radiation dose to as low as reasonably achievable. COMPARISON: 07/28/2022 CLINICAL HISTORY: Fall. Fall FINDINGS: BRAIN AND VENTRICLES: There is no acute intracranial hemorrhage, mass effect or midline shift. No abnormal extra-axial fluid collection. The gray-white differentiation is maintained without evidence of an acute infarct. There is no evidence of hydrocephalus. Global cortical atrophy. Subcortical and periventricular small vessel ischemic changes. ORBITS: The visualized portion of the orbits demonstrate no acute abnormality. SINUSES: The visualized paranasal sinuses and mastoid air cells demonstrate no acute abnormality. SOFT TISSUES AND SKULL: No acute abnormality of the visualized skull or soft tissues. IMPRESSION: 1. No acute intracranial abnormality. 2. Global cortical atrophy. 3. Subcortical and periventricular small vessel ischemic changes. Electronically signed by: Zadie Herter MD 07/19/2023 12:04 AM EDT RP Workstation: [id number]     Recent Labs    07/17/23 1244 07/18/23 1045  WBC 12.0* 11.6*  HGB 8.7* 8.2*  HCT 27.9* 27.4*  PLT 476* 520*    Recent Labs    07/18/23 1045  NA 135  K 4.2  CL 92*  CO2 23  GLUCOSE 106*  BUN 72*  CREATININE 6.72*   CALCIUM  10.1     Intake/Output Summary (Last 24 hours) at 07/19/2023 1522 Last data filed at 07/19/2023 1513 Gross per 24 hour  Intake 656.92 ml  Output 1510 ml  Net -853.08 ml        Physical Exam: Vital Signs Blood pressure (!) 154/64, pulse 84, temperature 98.2 F (36.8 C), resp. rate 15, height (P) 5\' 4"  (1.626 m), weight (P) 68.7 kg, SpO2 99%.  Gen: no distress, normal appearing, resting in bed.  HEENT: oral mucosa pink and moist, NCAT Cardio: Reg rate, no m/r/g appreciated Chest: normal effort, normal rate of breathing, CTAB Abd: soft, non-distended, +BS throughout, nonTTP Ext/MsK: no edema peripherally, LUE with graft site at L elbow with very faint erythema and slight warmth noted, no red streaking. Seems better than previously described. R knee tender, with effusion, but no erythema or significant warmth.  Psychiatric:     Comments: Pt flat, cooperative  Musculoskeletal:        General: Swelling and tenderness (left antecubital region) present.  Right knee swollen and tender    Cervical back: Normal range of motion.  Skin:    Comments: AVF site CDI. No erythema. Right heel boggy  Neurological:     Mental Status: She is alert.     Comments: Oriented to self only. Unable to recall age, DOB or place. Poor recall and decrease insight into deficits. She was able to follow simple motor commands. No CN deficits. Limited  RUE d/t HD catheter. LUE limited d/t pain. Grip/wrist at least 3-4/5 bilaterally. BLE 3- to 3/5 HF, KE and 4/5 ADF/PF. Decreased LT from ankles down bilaterally. No abnl resting tone. DTR's tr to 1+, stable 5/1 Functional mobility: modA toileting     Assessment/Plan: 1. Functional deficits which require 3+ hours per day of interdisciplinary therapy in a comprehensive inpatient rehab setting. Physiatrist is providing close team supervision and 24 hour management of active medical problems listed below. Physiatrist and rehab team continue to assess barriers  to discharge/monitor patient progress toward functional and medical goals  Care Tool:  Bathing    Body parts bathed by patient: Right arm, Left arm, Chest, Abdomen, Front perineal area, Right upper leg, Left upper leg, Right lower leg, Left lower leg, Face   Body parts bathed by helper: Buttocks     Bathing assist Assist Level: Minimal Assistance - Patient > 75%     Upper Body Dressing/Undressing Upper body dressing   What is the patient wearing?: Pull over shirt, Bra    Upper body assist Assist Level: Supervision/Verbal cueing    Lower Body Dressing/Undressing Lower body dressing      What is the patient wearing?: Pants     Lower body assist Assist for lower body dressing: Contact Guard/Touching assist     Toileting Toileting    Toileting assist Assist for toileting: Moderate Assistance - Patient 50 - 74%     Transfers Chair/bed transfer  Transfers assist     Chair/bed transfer assist level: Minimal Assistance - Patient > 75%     Locomotion Ambulation   Ambulation assist      Assist level: Minimal Assistance - Patient > 75% Assistive device: Walker-rolling Max distance: 70ft   Walk 10 feet activity   Assist  Walk 10 feet activity did not occur: Safety/medical concerns  Assist level: Minimal Assistance - Patient > 75% Assistive device: Walker-rolling   Walk 50 feet activity   Assist Walk 50 feet with 2 turns activity did not occur: Safety/medical concerns (pain)         Walk 150 feet activity   Assist Walk 150 feet activity did not occur: Safety/medical concerns (pain)         Walk 10 feet on uneven surface  activity   Assist Walk 10 feet on uneven surfaces activity did not occur: Safety/medical concerns (pain)         Wheelchair     Assist Is the patient using a wheelchair?: Yes Type of Wheelchair: Manual (lightweight)    Wheelchair assist level: Supervision/Verbal cueing Max wheelchair distance: 144ft     Wheelchair 50 feet with 2 turns activity    Assist        Assist Level: Supervision/Verbal cueing   Wheelchair 150 feet activity     Assist      Assist Level: Supervision/Verbal cueing   Blood pressure (!) 154/64, pulse 84, temperature 98.2 F (36.8 C), resp. rate 15, height (P) 5\' 4"  (1.626 m), weight (P) 68.7 kg, SpO2 99%.  Medical Problem List and Plan: 1. Functional deficits secondary to debility after acute respiratory failure and multiple medical issues             -patient may not yet shower             -ELOS/Goals: 10-14 days, supervision goals with PT, OT, SLP  Updated Josephus Nida  -continue CIR  Grounds pass ordered 2.  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 Acute on chronic LBP/Pain Management: Continue Hydrocodone  prn severe pain--monitor for sedation/confusion.              --Was on IV dilaudid  till 04/16-->now on hydrocodone  10 TID on average.              --increased use of Zanaflex  in past 48 hours.              --continue Gabapentin  300 mg/hs 4. Hx of depression/anxiety/Sleep: LCSW to follow for evaluation and support             --melatonin prn for insomnia.              -antipsychotic agents: N/A             -pt on chronic prozac  20mg  daily and remeron  7.5mg  nightly 5. Neuropsych/cognition: This patient is not capable of making decisions on her own behalf. 6. Skin/Wound Care: Routine pressure relief measures.              -float both heels 7. Fluids/Electrolytes/Nutrition: Strict I/O. Labs with HD             --1200 cc FR/day.  8. Cellulitis/Possible abscess LUE: On Van since 04/19 and Zosyn  since 04/17 --WBC trending down to 10.5 today. BC X 2 from 04/19 pending--negative so far.  --has been afebrile X 24 hours.  Zosyn  changed to Augmentin  by Dr. Sunnie England. End date for Vanc/augmentin  is 4/29              -pt reports that arm is feeling less  tender -07/14/23 no fevers but now worsening leukocytosis, WBC 19.7 yesterday and 20.4 on repeat today, LUE not really that concerning looking -continue vancomycin /ceftriaxone   9. H/o GIB: Monitor H/H as had further drop. Hgb down to             On IV iron /ESA  -07/13/23 Hgb stable last labs 4/20, monitor labs  -07/14/23 Hgb 8.4, stable/improving; monitor  10. HTN: Monitor BP TID--on Torsemide  on non dialysis days.   -4/26-27/25 [name] good, monitor  Vitals:   07/19/23 0138 07/19/23 0217 07/19/23 0321 07/19/23 0415  BP: (!) 141/61 (!) 144/63 138/64 (!) 141/67   07/19/23 0523 07/19/23 0650 07/19/23 0653 07/19/23 0836  BP: (!) 144/62 (!) 140/60 (!) (P) 161/67 (!) 162/63   07/19/23 0845 07/19/23 0900 07/19/23 0915 07/19/23 0930  BP: (!) 147/60 (!) 151/70 (!) 165/68 (!) 154/64     11. 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 H/o Gastritis/esophagitis: Most recent admission last month. Continue Protonix  40mg  BID.  --Has required one unit PRBC 04/16.              --monitor for signs of bleeding.   13. Chronic diarrhea:  continue Colestid  1g TID and Creon  36kU tid- changed to after meals as per daughter's request, last BM 4/28  14. H/o cognitive/memory deficits: continue Exelon  1.5mg  bid. Namenda  added  15. ESRD: Now HD dependent--TTS at the end of the day to help with tolerance of therapy             --continue renveal for metabolic bone disease.  Per nephro  17. T2DM: Hgb A1c- 6.2 and diet controlled. Add CM restrictions.  -d/c ISS, d/c CBGs  18. Bilateral knee pain: voltaren  gel scheduled, right knee sleeve ordered, continue  19. Bowel incontinence: lumbar MRI showed and discussed there is no evidence of cauda equina  20. New upper extremity weakness: cervical MRI ordered  and discussed that there is moderate canal stenosis, discussed with daughter that oral steroids, steroid injection, surgery are possible interventions- deferred at this time given patient's risk  factors  21. Right knee effusion: culture reviewed and with 89,000 WBC concerning for septic arthritis, will consult ID, procal elevated, s/p ortho washout, continue ceftriaxone /vanc  21. Leukocytosis:repeat CBC ordered, procal ordered, consulted ID, continue ceftriaxone /vanc  22. Anemia: Hgb reviewed and is improved  23. Dementia: added namenda  5mg  daily, continue  24. Neuropathic pain: added namenda  5mg  daily, metanx added, discussed with daughter, continue these  LOS: 11 days A FACE TO FACE EVALUATION WAS PERFORMED  Lavell Portugal P Sevilla Murtagh 07/19/2023, 3:22 PM

## 2023-07-19 NOTE — Anesthesia Procedure Notes (Signed)
 Procedure Name: LMA Insertion Date/Time: 07/19/2023 7:50 AM  Performed by: Colen Daunt, CRNAPre-anesthesia Checklist: Patient identified, Emergency Drugs available, Suction available and Patient being monitored Patient Re-evaluated:Patient Re-evaluated prior to induction Oxygen Delivery Method: Circle System Utilized Preoxygenation: Pre-oxygenation with 100% oxygen Induction Type: IV induction Ventilation: Mask ventilation without difficulty LMA: LMA inserted LMA Size: 4.0 Number of attempts: 1 Placement Confirmation: positive ETCO2 Tube secured with: Tape Dental Injury: Teeth and Oropharynx as per pre-operative assessment

## 2023-07-19 NOTE — H&P (View-Only) (Signed)
 Patient continues to have significant pain in her right knee.  The aspiration that was performed showed 90,000 cells with gout crystals.  Unfortunately due to the elevated white blood cell count that she has as well as the elevated white blood count and the aspiration I do not feel comfortable giving her a steroid injection.  As result due to the persistent pain and limited range of motion we will plan to proceed with incision and drainage of the right knee.  I discussed risks and benefits with the patient and her daughters.  Risks include but not limited to continued pain, worsening infection, posttraumatic arthritis, nerve and blood vessel injury, even the possible anesthetic complications.  They agreed to proceed with surgery and consent was obtained.  Laneta Pintos, MD [facility] [phone] (office) [email or web address]

## 2023-07-19 NOTE — Interval H&P Note (Signed)
 History and Physical Interval Note:  07/19/2023 7:29 AM  [name]  has presented today for surgery, with the diagnosis of Right knee infection.  The various methods of treatment have been discussed with the patient and family. After consideration of risks, benefits and other options for treatment, the patient has consented to  Procedure(s): IRRIGATION AND DEBRIDEMENT KNEE (Right) as a surgical intervention.  The patient's history has been reviewed, patient examined, no change in status, stable for surgery.  I have reviewed the patient's chart and labs.  Questions were answered to the patient's satisfaction.     Jerlene Rockers P Adrienne Trombetta

## 2023-07-19 NOTE — Progress Notes (Signed)
 Physical Therapy Session Note  Patient Details  Name: [name] MRN: [id number] Date of Birth: March 12, 1946  Today's Date: 07/19/2023 PT Individual Time: 1300-1410 PT Individual Time Calculation (min): 70 min   Short Term Goals: Week 1:  PT Short Term Goal 1 (Week 1): pt will transfer bed<>chair with LRAD and CGA PT Short Term Goal 1 - Progress (Week 1): Progressing toward goal PT Short Term Goal 2 (Week 1): pt will transfer sit<>stand with LRAD and CGA PT Short Term Goal 2 - Progress (Week 1): Progressing toward goal PT Short Term Goal 3 (Week 1): pt will ambulate 89ft with LRAD and CGA PT Short Term Goal 3 - Progress (Week 1): Progressing toward goal Week 2:  PT Short Term Goal 1 (Week 2): STG=LTG due to LOS  Skilled Therapeutic Interventions/Progress Updates:   Pt underwent surgical debridement due to suspected septic arthritis of the right knee earlier this morning and per chart review, sustained fall (most likely OOB) last night with no reported injuries. Pt reporting she put herself on the ground to pick up papers, then got herself back up without assist from staff (question accuracy due to dementia). Attempted to see pt to make up missed time from this morning but pt/daughter unsure if pt cleared for therapy.   Received clarification from MD that pt cleared for therapy as tolerated. Received pt semi-reclined in bed with daughter at bedside. Pt agreeable to PT treatment with encouragement and reported pain 8/10 in R knee (premedicated). Session with emphasis on functional mobility/transfers, toileting, dressing, generalized strengthening and endurance, dynamic standing balance/coordination, and ambulation. Pt transferred semi-reclined<>sitting R EOB with HOB elevated and supervision with increased time. Pt reported urge to void and transferred into WC via stand<>pivot with RW and min A from elevated EOB. Transferred to/from toilet with bedside commode over top with min A. Pt able to  void and perform hygiene management without assist. Sat in WC at sink and performed hand hygiene with set up assist.   Donned slip on shoes, underwear, and pants with max A. Stood from [facility] with RW and min A and required mod A to pull pants over hips. Doffed gown and RN notified and present to disconnect IV. Donned clean pull over shirt with supervision. Stood from [facility] with RW and min A (cues for sequencing brake management and hand placement). Pt then ambulated 45ft with RW and CGA with WC follow - noted improvements in bilateral knee extension and upright posture but still limited by pain.   Pt performed WC mobility 149ft using BLE and supervision with increased time and emphasis on knee flexion ROM - pt required encouragement to continue. Pt agreed to attempt to ambulate again - stood from [facility] with heavy min A but reported having too much pain to ambulate and returned to sitting. Transported to new room and concluded session with pt sitting in TIS WC, needs within reach, and seatbelt alarm on. Daughter present at bedside.   Therapy Documentation Precautions:  Precautions Precautions: Fall Precaution/Restrictions Comments: dementia Restrictions Weight Bearing Restrictions Per Provider Order: No  Therapy/Group: Individual Therapy [name] Nena Bank PT, DPT 07/19/2023, 6:50 AM

## 2023-07-19 NOTE — Progress Notes (Signed)
 Occupational Therapy Note  Patient Details  Name: Melizza [place] Mikrut MRN: [id number] Date of Birth: Apr 22, 1945  Today's Date: 07/19/2023 OT Missed Time: 60 Minutes Missed Time Reason: Unavailable (comment);Other (comment) (pt at procedure)  Pt off unit at scheduled therapy time for procedure. Pt missed 60 mins of OT session.    Mollie Anger [facility] 07/19/2023, 12:19 PM

## 2023-07-19 NOTE — Progress Notes (Signed)
 [facility]    Date of Admission:  07/08/2023      ID: [name] is a 78 y.o. female with  hx of left arm vascular graft failure s/p ligation but had possible abscess finished 14 days of iv vancomycin  but then found to have bilateral pseudogout, but concern for septic arthritis to right knee Principal Problem:   Debility Active Problems:   Septic arthritis (HCC)   Pseudogout    Subjective: Underwent wash out of right knee. Cultures sent  Medications:   apixaban   5 mg Oral BID   B-complex with vitamin C  1 tablet Oral Daily   chlorhexidine        Chlorhexidine  Gluconate Cloth  6 each Topical Q12H   cholecalciferol   1,000 Units Oral Daily   colestipol   1 g Oral TID AC   darbepoetin (ARANESP ) injection - DIALYSIS  200 mcg Subcutaneous Q Sat-1800   diclofenac  Sodium  2 g Topical QID   docusate sodium   100 mg Oral BID   fentaNYL        fentaNYL        FLUoxetine   20 mg Oral Daily   gabapentin   300 mg Oral QHS   kidney failure book   Does not apply Once   l-methylfolate-B6-B12  1 tablet Oral Daily   lipase/protease/amylase  36,000 Units Oral TID AC   memantine   5 mg Oral Daily   mirtazapine   7.5 mg Oral QHS   multivitamin  1 tablet Oral QHS   pantoprazole   40 mg Oral BID   rivastigmine   1.5 mg Oral BID   sevelamer  carbonate  1,600 mg Oral TID WC   torsemide   100 mg Oral Q M,W,F   And   torsemide   100 mg Oral Q Sun    Objective: Vital signs in last 24 hours: Temp:  [97.1 F (36.2 C)-98.8 F (37.1 C)] 98.2 F (36.8 C) (05/02 0930) Pulse Rate:  [66-107] 84 (05/02 0930) Resp:  [14-22] 15 (05/02 0930) BP: (95-165)/(54-96) 154/64 (05/02 0930) SpO2:  [92 %-100 %] 99 % (05/02 0930) Weight:  [68.7 kg] (P) 68.7 kg (05/02 0653) Physical Exam  Constitutional:  oriented to person, place, and time. appears well-developed and well-nourished. No distress.  HENT: [place]/AT, PERRLA, no scleral icterus Mouth/Throat: Oropharynx is clear and moist. No  oropharyngeal exudate.  Cardiovascular: Normal rate, regular rhythm and normal heart sounds. Exam reveals no gallop and no friction rub.  No murmur heard.  Pulmonary/Chest: Effort normal and breath sounds normal. No respiratory distress.  has no wheezes.  Neck = supple, no nuchal rigidity Lymphadenopathy: no cervical adenopathy. No axillary adenopathy Ext: right knee wrapped. Skin: Skin is warm and dry. No rash noted. No erythema. Left arm healed. No warmth Psychiatric: a normal mood and affect.  behavior is normal.    Lab Results Recent Labs    07/17/23 1244 07/18/23 1045  WBC 12.0* 11.6*  HGB 8.7* 8.2*  HCT 27.9* 27.4*  NA  --  135  K  --  4.2  CL  --  92*  CO2  --  23  BUN  --  72*  CREATININE  --  6.72*   Liver Panel Recent Labs    07/18/23 1045  ALBUMIN  2.4*     Microbiology: reviewed Studies/Results: CT HEAD WO CONTRAST ( ) Result Date: 07/19/2023 EXAM: CT HEAD WITHOUT 07/19/2023 12:01:08 AM TECHNIQUE: CT of the head was performed without the administration of intravenous contrast. Automated exposure control, iterative reconstruction, and/or  weight based adjustment of the mA/kV was utilized to reduce the radiation dose to as low as reasonably achievable. COMPARISON: 07/28/2022 CLINICAL HISTORY: Fall. Fall FINDINGS: BRAIN AND VENTRICLES: There is no acute intracranial hemorrhage, mass effect or midline shift. No abnormal extra-axial fluid collection. The gray-white differentiation is maintained without evidence of an acute infarct. There is no evidence of hydrocephalus. Global cortical atrophy. Subcortical and periventricular small vessel ischemic changes. ORBITS: The visualized portion of the orbits demonstrate no acute abnormality. SINUSES: The visualized paranasal sinuses and mastoid air cells demonstrate no acute abnormality. SOFT TISSUES AND SKULL: No acute abnormality of the visualized skull or soft tissues. IMPRESSION: 1. No acute intracranial abnormality. 2. Global  cortical atrophy. 3. Subcortical and periventricular small vessel ischemic changes. Electronically signed by: Zadie Herter MD 07/19/2023 12:04 AM EDT RP Workstation: [id number]     Assessment/Plan: Probable right knee septic arthritis = continue with vancomycin  and ceftriaxone . Await for culture results.to help narrow abtx. But or cultures maybe sterile since she has been on abtx for 72hrs _to recommend to treat for 4 wk from I x D  Will check sed rate and crp  Esrd on hd = will renally dose abtx  Hx  of  abscess on LUE after vascular graft removed  = consider that has been treated.  Pseudogout of knees bilaterally = defer primary team for management.  [facility] Pager: [phone]  07/19/2023, 5:23 PM

## 2023-07-19 NOTE — Progress Notes (Signed)
 Report called to Etha Henle, CRNA

## 2023-07-19 NOTE — Progress Notes (Signed)
 Patient ID: [name], female   DOB: [birth year], 78 y.o.   MRN: [id number]  HPI:  Pt is a 78 y.o.  with  hypertension, renal vein thrombosis on Eliquis , stroke, type II DM, lupus, CKD 4-5 who was recently admitted for CHF exacerbation and progressive CKD presented back with worsening dyspnea on exertion and fluid overload.    Assessment/Plan:   # Progressive CKD 5 versus AKI on CKD,: With frequent hospitalization for fluid overload.  Responding to IV diuretics but not po and with worsening renal function d/w patient and family (Daughter on phone 4/2) at length recommend starting dialysis. S/p LUE AVG + TDC placement 4/4 -> AVG removed bec of steal -Continue dialysis on TTS schedule this week; BP dropped on Thursday.  Will limit UF to 1 L and give albumin  if needed.  If this continues we need to use midodrine  before dialysis.    Will switch to MWF next week to facilitate dc.  - CLIP complete-  has been accepted at [facility]   # CKD-MBD/hyperphosphatemia: Increased sevelamer  on 4/21 to 2 TIDM. PTH at goal. Repeat on 5/1   #Anemia of CKD: Concern about GI bleed, underwent endoscopy t recently with no sign of bleeding.  Back on Eliquis .  Has required transfusion.  Given IV iron  and ESA   # Hypertension/volume: [name] improved after diuresis.  Torsemide  on non-dialysis days   #A fib: on eliquis    # Vascular access - developed steal syndrome of left hand. - VVS following s/p excision of AVG 07/01/23 due to concern for steal syndrome with improvement of symptoms and ok to resume anticoagulation.    #Disposition -now in CIR  S: Feels well today. S/p I&D Rt knee by Dr. Curtiss Dowdy 5/2. Denies fever, chills, nausea, vomiting.  Appetite decent.  Daughter at bedside updated  O:BP (!) 154/64 (BP Location: Right Arm)   Pulse 84   Temp 98.2 F (36.8 C)   Resp 15   Ht (P) 5\' 4"  (1.626 m)   Wt (P) 68.7 kg   SpO2 99%   BMI (P) 26.00 kg/m   Intake/Output Summary (Last 24 hours) at 07/19/2023  1108 Last data filed at 07/19/2023 0830 Gross per 24 hour  Intake 780 ml  Output 1510 ml  Net -730 ml   Intake/Output: I/O last 3 completed shifts: In: 851.9 [P.O.:600; IV Piggyback:251.9] Out: 1510 [Other:1500; Blood:10]  Intake/Output this shift:  Total I/O In: 300 [I.V.:200; IV Piggyback:100] Out: -  Weight change: 3.4 kg Gen: NAD lying in bed CVS: Normal rate, no rub Resp: Bilateral chest rise with no increased work of breathing Abd: +BS, soft, NT/ND Ext: no edema, warm and well-perfused Access: Right IJ TC  Recent Labs  Lab 07/13/23 2115 07/16/23 1354 07/18/23 1045  NA 133* 130* 135  K 3.7 4.0 4.2  CL 93* 92* 92*  CO2 20* 19* 23  GLUCOSE 238* 153* 106*  BUN 95* 94* 72*  CREATININE 8.20* 8.11* 6.72*  ALBUMIN  2.0* 2.0* 2.4*  CALCIUM  9.5 9.5 10.1  PHOS  --  4.9* 4.2  AST 47*  --   --   ALT 48*  --   --    Liver Function Tests: Recent Labs  Lab 07/13/23 2115 07/16/23 1354 07/18/23 1045  AST 47*  --   --   ALT 48*  --   --   ALKPHOS 222*  --   --   BILITOT 0.7  --   --   PROT 6.8  --   --  ALBUMIN  2.0* 2.0* 2.4*   No results for input(s): "LIPASE", "AMYLASE" in the last 168 hours. No results for input(s): "AMMONIA" in the last 168 hours. CBC: Recent Labs  Lab 07/15/23 0849 07/16/23 1006 07/16/23 1354 07/17/23 1244 07/18/23 1045  WBC 15.9* 12.6* 14.9* 12.0* 11.6*  NEUTROABS 13.7* 10.5*  --  9.2*  --   HGB 7.8* 8.3* 7.6* 8.7* 8.2*  HCT 25.3* 26.6* 24.9* 27.9* 27.4*  MCV 93.0 91.7 92.9 90.9 92.6  PLT 451* 474* 470* 476* 520*   Cardiac Enzymes: No results for input(s): "CKTOTAL", "CKMB", "CKMBINDEX", "TROPONINI" in the last 168 hours. CBG: Recent Labs  Lab 07/17/23 1657 07/18/23 2103 07/18/23 2209 07/19/23 0657 07/19/23 0836  GLUCAP 161* 162* 194* 92 106*    Iron  Studies: No results for input(s): "IRON ", "TIBC", "TRANSFERRIN", "FERRITIN" in the last 72 hours. Studies/Results: CT HEAD WO CONTRAST ( ) Result Date: 07/19/2023 EXAM: CT  HEAD WITHOUT 07/19/2023 12:01:08 AM TECHNIQUE: CT of the head was performed without the administration of intravenous contrast. Automated exposure control, iterative reconstruction, and/or weight based adjustment of the mA/kV was utilized to reduce the radiation dose to as low as reasonably achievable. COMPARISON: 07/28/2022 CLINICAL HISTORY: Fall. Fall FINDINGS: BRAIN AND VENTRICLES: There is no acute intracranial hemorrhage, mass effect or midline shift. No abnormal extra-axial fluid collection. The gray-white differentiation is maintained without evidence of an acute infarct. There is no evidence of hydrocephalus. Global cortical atrophy. Subcortical and periventricular small vessel ischemic changes. ORBITS: The visualized portion of the orbits demonstrate no acute abnormality. SINUSES: The visualized paranasal sinuses and mastoid air cells demonstrate no acute abnormality. SOFT TISSUES AND SKULL: No acute abnormality of the visualized skull or soft tissues. IMPRESSION: 1. No acute intracranial abnormality. 2. Global cortical atrophy. 3. Subcortical and periventricular small vessel ischemic changes. Electronically signed by: Zadie Herter MD 07/19/2023 12:04 AM EDT RP Workstation: [id number]      apixaban   5 mg Oral BID   B-complex with vitamin C  1 tablet Oral Daily   chlorhexidine        Chlorhexidine  Gluconate Cloth  6 each Topical Q12H   cholecalciferol   1,000 Units Oral Daily   colestipol   1 g Oral TID AC   darbepoetin (ARANESP ) injection - DIALYSIS  200 mcg Subcutaneous Q Sat-1800   diclofenac  Sodium  2 g Topical QID   docusate sodium   100 mg Oral BID   fentaNYL        fentaNYL        FLUoxetine   20 mg Oral Daily   gabapentin   300 mg Oral QHS   kidney failure book   Does not apply Once   l-methylfolate-B6-B12  1 tablet Oral Daily   lipase/protease/amylase  36,000 Units Oral TID AC   memantine   5 mg Oral Daily   mirtazapine   7.5 mg Oral QHS   multivitamin  1 tablet Oral QHS    pantoprazole   40 mg Oral BID   rivastigmine   1.5 mg Oral BID   sevelamer  carbonate  1,600 mg Oral TID WC   torsemide   100 mg Oral Q M,W,F   And   torsemide   100 mg Oral Q Sun    BMET    Component Value Date/Time   NA 135 07/18/2023 1045   NA 144 07/19/2020 1458   NA 140 03/14/2017 0810   K 4.2 07/18/2023 1045   K 3.8 03/14/2017 0810   CL 92 (L) 07/18/2023 1045   CO2 23 07/18/2023 1045   CO2 27 03/14/2017 0810  GLUCOSE 106 (H) 07/18/2023 1045   GLUCOSE 177 (H) 03/14/2017 0810   BUN 72 (H) 07/18/2023 1045   BUN 85 (HH) 07/19/2020 1458   BUN 35.6 (H) 03/14/2017 0810   CREATININE 6.72 (H) 07/18/2023 1045   CREATININE 2.31 (H) 10/25/2022 1355   CREATININE 1.8 (H) 03/14/2017 0810   CALCIUM  10.1 07/18/2023 1045   CALCIUM  8.3 (L) 06/18/2023 0334   CALCIUM  9.6 03/14/2017 0810   GFRNONAA 6 (L) 07/18/2023 1045   GFRNONAA 22 (L) 10/25/2022 1355   GFRNONAA 45 (L) 12/17/2013 1158   GFRAA 21 (L) 12/21/2019 0845   GFRAA 52 (L) 12/17/2013 1158   CBC    Component Value Date/Time   WBC 11.6 (H) 07/18/2023 1045   RBC 2.96 (L) 07/18/2023 1045   HGB 8.2 (L) 07/18/2023 1045   HGB 9.4 (L) 04/18/2023 1434   HGB 9.3 (L) 03/14/2017 0810   HCT 27.4 (L) 07/18/2023 1045   HCT 26.4 (L) 07/21/2019 0809   HCT 29.2 (L) 03/14/2017 0810   PLT 520 (H) 07/18/2023 1045   PLT 235 04/18/2023 1434   PLT 292 03/14/2017 0810   PLT 351 10/11/2016 1139   MCV 92.6 07/18/2023 1045   MCV 81.3 03/14/2017 0810   MCH 27.7 07/18/2023 1045   MCHC 29.9 (L) 07/18/2023 1045   RDW 16.9 (H) 07/18/2023 1045   RDW 14.0 03/14/2017 0810   LYMPHSABS 1.3 07/17/2023 1244   LYMPHSABS 1.4 03/14/2017 0810   MONOABS 0.9 07/17/2023 1244   MONOABS 0.4 03/14/2017 0810   EOSABS 0.1 07/17/2023 1244   EOSABS 0.1 03/14/2017 0810   BASOSABS 0.1 07/17/2023 1244   BASOSABS 0.0 03/14/2017 0810

## 2023-07-19 NOTE — Anesthesia Preprocedure Evaluation (Signed)
 Anesthesia Evaluation  Patient identified by MRN, date of birth, ID band Patient awake    Reviewed: Allergy & Precautions, NPO status , Patient's Chart, lab work & pertinent test results  History of Anesthesia Complications Negative for: history of anesthetic complications  Airway Mallampati: II  TM Distance: >3 FB Neck ROM: Full    Dental  (+) Edentulous Upper, Edentulous Lower, Dental Advisory Given   Pulmonary shortness of breath, neg sleep apnea, neg COPD, neg recent URI   breath sounds clear to auscultation       Cardiovascular hypertension, Pt. on medications (-) angina + CAD and +CHF  (-) Past MI  Rhythm:Regular  1. Left ventricular ejection fraction, by estimation, is 60 to 65%. The  left ventricle has normal function. The left ventricle has no regional  wall motion abnormalities. There is moderate left ventricular hypertrophy.  Left ventricular diastolic  parameters are consistent with Grade I diastolic dysfunction (impaired  relaxation).   2. Right ventricular systolic function is normal. The right ventricular  size is normal. There is normal pulmonary artery systolic pressure.   3. The mitral valve is abnormal. Mild mitral valve regurgitation. No  evidence of mitral stenosis.   4. The aortic valve is tricuspid. Aortic valve regurgitation is not  visualized. No aortic stenosis is present.   5. The inferior vena cava is normal in size with greater than 50%  respiratory variability, suggesting right atrial pressure of 3 mmHg.      Neuro/Psych  PSYCHIATRIC DISORDERS  Depression    CVA, No Residual Symptoms    GI/Hepatic Neg liver ROS,GERD  ,,  Endo/Other  diabetes, Type 2, Insulin  Dependent    Renal/GU ESRF and DialysisRenal diseaseLab Results      Component                Value               Date                      NA                       135                 07/18/2023                K                         4.2                 07/18/2023                CO2                      23                  07/18/2023                GLUCOSE                  106 (H)             07/18/2023                BUN                      72 (H)  07/18/2023                CREATININE               6.72 (H)            07/18/2023                CALCIUM                   10.1                07/18/2023                GFR                      28.35 (L)           10/25/2017                EGFR                     18 (L)              07/19/2020                GFRNONAA                 6 (L)               07/18/2023             Last hd yesterday     Musculoskeletal  (+) Arthritis ,    Abdominal   Peds  Hematology  (+) Blood dyscrasia, anemia Lab Results      Component                Value               Date                      WBC                      11.6 (H)            07/18/2023                HGB                      8.2 (L)             07/18/2023                HCT                      27.4 (L)            07/18/2023                MCV                      92.6                07/18/2023                PLT                      520 (H)             07/18/2023  Anesthesia Other Findings lupus  Reproductive/Obstetrics                              Anesthesia Physical Anesthesia Plan  ASA: 4  Anesthesia Plan: General   Post-op Pain Management: Minimal or no pain anticipated   Induction: Intravenous  PONV Risk Score and Plan: 4 or greater and Ondansetron  and Dexamethasone   Airway Management Planned: LMA and Oral ETT  Additional Equipment: None  Intra-op Plan:   Post-operative Plan: Extubation in OR  Informed Consent: I have reviewed the patients History and Physical, chart, labs and discussed the procedure including the risks, benefits and alternatives for the proposed anesthesia with the patient or authorized representative who has indicated his/her  understanding and acceptance.     Dental advisory given  Plan Discussed with: CRNA  Anesthesia Plan Comments:          Anesthesia Quick Evaluation

## 2023-07-19 NOTE — Op Note (Signed)
 Orthopaedic Surgery Operative Note (CSN: [id number] ) Date of Surgery: 07/19/2023  Admit Date: 07/08/2023   Diagnoses: Pre-Op  Diagnoses: Right septic knee  Post-Op Diagnosis: Same  Procedures: CPT 27310-Incision and drainage of right knee infection  Surgeons : Primary: Laneta Pintos, MD  Assistant: Alona Jamaica, PA-C  Location: OR 3   Anesthesia: General   Antibiotics: Ancef  2g preop with 1gm vancomycin  powder and 1.2 gm tobramycin  powder placed topically   Tourniquet time: None    Estimated Blood Loss: Minimal  Complications:* No complications entered in OR log *   Specimens: ID Type Source Tests Collected by Time Destination  A : Right Knee Tissue Tissue Soft Tissue, Other AEROBIC/ANAEROBIC CULTURE W GRAM STAIN (SURGICAL/DEEP WOUND) Jaileigh Weimer, Florentina Huntsman, MD 07/19/2023 [phone]   B : Right Knee Swab Tissue Soft Tissue, Other AEROBIC/ANAEROBIC CULTURE W GRAM STAIN (SURGICAL/DEEP WOUND) Laneta Pintos, MD 07/19/2023 [phone]      Implants: * No implants in log *   Indications for Surgery: 78 year old female who developed a right knee effusion with significant pain with an elevated white blood cell count.  An aspiration was performed which showed minimal fluid got returned but it was 90,000 cells with gout crystals.  The patient did not feel much better after the aspiration.  Due to the concern about infection in the setting of gout I recommended proceeding to the operating room for I&D of her right knee.  Risks and benefits were discussed with the patient's daughters.  Risk include but not limited to bleeding, infection, persistent pain, knee arthritis, knee stiffness, and the possible anesthetic complications.  They agreed to proceed with surgery and consent was obtained.  Operative Findings: Incision and drainage of right knee with significant fibrinous material in the knee with some cloudy fluid but no obvious purulence.  Procedure: The patient was identified in the preoperative  holding area. Consent was confirmed with the patient and their family and all questions were answered. The operative extremity was marked after confirmation with the patient. she was then brought back to the operating room by our anesthesia colleagues.  She was placed under general anesthetic and carefully transferred over to radiolucent flattop table.  The right lower extremity was then prepped and draped in usual sterile fashion.  A timeout was performed to verify the patient, the procedure, and the extremity.  Preoperative antibiotics were dosed.  The hip and knee were flexed over a triangle.  A small medial parapatellar incision was made and carried down through skin subcutaneous tissue.  I incised through the capsule just medial to the patella.  I entered the knee joint and encountered the above material which was fibrinous in nature no gross pus but there was some murky fluid that was present.  We sent all of this for culture.  I then proceeded to irrigate the knee joint with 3 L of normal saline using cystoscopy tubing.  Once this was completed I then placed a gram vancomycin  powder 1.2 g of tobramycin  powder within the knee joint itself.  The capsule was closed with 0 PDS.  The skin was closed with 3-0 nylon.  The sterile dressing was applied.  The patient was then awoke from anesthesia and taken to the PACU in stable condition.   Debridement type: Excisional Debridement  Side: right  Body Location: Knee  Tools used for debridement: scalpel and rongeur  Pre-debridement Wound size (cm):   N/A  Post-debridement Wound size (cm):   N/A  Debridement depth beyond dead/damaged tissue down  to healthy viable tissue: yes  Tissue layer involved: skin, subcutaneous tissue, synovium  Nature of tissue removed: Other: Fibrinous material  Irrigation volume: 3L     Irrigation fluid type: Normal Saline  Post Op Plan/Instructions: Patient will be weightbearing as tolerated to the right lower  extremity.  She will receive postoperative antibiotics.  She will unrestricted range of motion.  She will continue to work with physical and Occupational Therapy.  I was present and performed the entire surgery.  Alona Jamaica, PA-C did assist me throughout the case. An assistant was necessary given the difficulty in approach, maintenance of reduction and ability to instrument the fracture.   Katheryne Pane, MD [facility]

## 2023-07-19 NOTE — Progress Notes (Signed)
 Occupational Therapy Note  Patient Details  Name: Valli [place] Stys MRN: [id number] Date of Birth: [birth year]  Today's Date: 07/19/2023 OT Missed Time: 75 Minutes Missed Time Reason: Unavailable (comment)  Pt off unit at scheduled therapy time for procedure. Missed 75 mins of OT intervention, will attempt to make up missed mins as appropriate.  Ravleen Ries E Charnae Lill, MS, OTR/L  07/19/2023, 12:08 PM

## 2023-07-19 NOTE — Plan of Care (Signed)
  Problem: Consults Goal: RH GENERAL PATIENT EDUCATION Description: See Patient Education module for education specifics. Outcome: Progressing   Problem: RH BOWEL ELIMINATION Goal: RH STG MANAGE BOWEL WITH ASSISTANCE Description: STG Manage Bowel with mod I Assistance. Outcome: Progressing Goal: RH STG MANAGE BOWEL W/MEDICATION W/ASSISTANCE Description: STG Manage Bowel with Medication with mod I Assistance. Outcome: Progressing   Problem: RH SAFETY Goal: RH STG ADHERE TO SAFETY PRECAUTIONS W/ASSISTANCE/DEVICE Description: STG Adhere to Safety Precautions With cues Assistance/Device. Outcome: Progressing

## 2023-07-19 NOTE — Progress Notes (Signed)
 Patient continues to have significant pain in her right knee.  The aspiration that was performed showed 90,000 cells with gout crystals.  Unfortunately due to the elevated white blood cell count that she has as well as the elevated white blood count and the aspiration I do not feel comfortable giving her a steroid injection.  As result due to the persistent pain and limited range of motion we will plan to proceed with incision and drainage of the right knee.  I discussed risks and benefits with the patient and her daughters.  Risks include but not limited to continued pain, worsening infection, posttraumatic arthritis, nerve and blood vessel injury, even the possible anesthetic complications.  They agreed to proceed with surgery and consent was obtained.  Laneta Pintos, MD [facility] [phone] (office) [email or web address]

## 2023-07-20 LAB — CBC
HCT: 26.6 % — ABNORMAL LOW (ref 36.0–46.0)
Hemoglobin: 8.2 g/dL — ABNORMAL LOW (ref 12.0–15.0)
MCH: 28.4 pg (ref 26.0–34.0)
MCHC: 30.8 g/dL (ref 30.0–36.0)
MCV: 92 fL (ref 80.0–100.0)
Platelets: 464 10*3/uL — ABNORMAL HIGH (ref 150–400)
RBC: 2.89 MIL/uL — ABNORMAL LOW (ref 3.87–5.11)
RDW: 16.9 % — ABNORMAL HIGH (ref 11.5–15.5)
WBC: 14.2 10*3/uL — ABNORMAL HIGH (ref 4.0–10.5)
nRBC: 0.5 % — ABNORMAL HIGH (ref 0.0–0.2)

## 2023-07-20 LAB — BODY FLUID CULTURE W GRAM STAIN
Culture: NO GROWTH
Culture: NO GROWTH

## 2023-07-20 LAB — BASIC METABOLIC PANEL WITH GFR
Anion gap: 20 — ABNORMAL HIGH (ref 5–15)
BUN: 63 mg/dL — ABNORMAL HIGH (ref 8–23)
CO2: 22 mmol/L (ref 22–32)
Calcium: 9.9 mg/dL (ref 8.9–10.3)
Chloride: 92 mmol/L — ABNORMAL LOW (ref 98–111)
Creatinine, Ser: 6.15 mg/dL — ABNORMAL HIGH (ref 0.44–1.00)
GFR, Estimated: 7 mL/min — ABNORMAL LOW (ref >60)
Glucose, Bld: 143 mg/dL — ABNORMAL HIGH (ref 70–99)
Potassium: 4.4 mmol/L (ref 3.5–5.1)
Sodium: 134 mmol/L — ABNORMAL LOW (ref 135–145)

## 2023-07-20 MED ORDER — HEPARIN SODIUM (PORCINE) 1000 UNIT/ML IJ SOLN
3200.0000 [IU] | Freq: Once | INTRAMUSCULAR | Status: AC
  Administered 2023-07-20: 3200 [IU]

## 2023-07-20 MED ORDER — HEPARIN SODIUM (PORCINE) 1000 UNIT/ML IJ SOLN
INTRAMUSCULAR | Status: AC
  Filled 2023-07-20: qty 1

## 2023-07-20 NOTE — Progress Notes (Signed)
 PROGRESS NOTE   Subjective/Complaints:  Doing well, slept well, pain well managed overall and knees both feel a lot better. LBM last night. Urinating per usual. Gets dialysis today. No other complaints or concerns.   ROS: +upper and lower extremity weakness- improved, +bowel incontinence, +right knee pain-limits therapy, continues but is betterr. Denies CP, SOB, abd pain, n/v/d/c, +dizziness   Objective:   CT HEAD WO CONTRAST ( ) Result Date: 07/19/2023 EXAM: CT HEAD WITHOUT 07/19/2023 12:01:08 AM TECHNIQUE: CT of the head was performed without the administration of intravenous contrast. Automated exposure control, iterative reconstruction, and/or weight based adjustment of the mA/kV was utilized to reduce the radiation dose to as low as reasonably achievable. COMPARISON: 07/28/2022 CLINICAL HISTORY: Fall. Fall FINDINGS: BRAIN AND VENTRICLES: There is no acute intracranial hemorrhage, mass effect or midline shift. No abnormal extra-axial fluid collection. The gray-white differentiation is maintained without evidence of an acute infarct. There is no evidence of hydrocephalus. Global cortical atrophy. Subcortical and periventricular small vessel ischemic changes. ORBITS: The visualized portion of the orbits demonstrate no acute abnormality. SINUSES: The visualized paranasal sinuses and mastoid air cells demonstrate no acute abnormality. SOFT TISSUES AND SKULL: No acute abnormality of the visualized skull or soft tissues. IMPRESSION: 1. No acute intracranial abnormality. 2. Global cortical atrophy. 3. Subcortical and periventricular small vessel ischemic changes. Electronically signed by: Zadie Herter MD 07/19/2023 12:04 AM EDT RP Workstation: [id number]     Recent Labs    07/18/23 1045 07/20/23 0553  WBC 11.6* 14.2*  HGB 8.2* 8.2*  HCT 27.4* 26.6*  PLT 520* 464*    Recent Labs    07/18/23 1045 07/20/23 0553  NA 135 134*  K  4.2 4.4  CL 92* 92*  CO2 23 22  GLUCOSE 106* 143*  BUN 72* 63*  CREATININE 6.72* 6.15*  CALCIUM  10.1 9.9     Intake/Output Summary (Last 24 hours) at 07/20/2023 1242 Last data filed at 07/20/2023 0839 Gross per 24 hour  Intake 433.92 ml  Output --  Net 433.92 ml        Physical Exam: Vital Signs Blood pressure (!) 182/76, pulse 86, temperature 98 F (36.7 C), resp. rate 18, height (P) 5\' 4"  (1.626 m), weight 72.5 kg, SpO2 100%.  Gen: no distress, normal appearing, resting in bed.  HEENT: oral mucosa pink and moist, NCAT Cardio: Reg rate, no m/r/g appreciated Chest: normal effort, normal rate of breathing, CTAB Abd: soft, non-distended, +BS throughout, nonTTP Ext/MsK: no edema peripherally, LUE with graft site at L elbow without significant erythema.  R knee wrapped in bulky dressing and ACE wrap. L knee with less tenderness and no effusion.  Psychiatric:     Comments: Pt flat, cooperative   PRIOR EXAMS:  Musculoskeletal:        General: Swelling and tenderness (left antecubital region) present.  Right knee swollen and tender    Cervical back: Normal range of motion.  Skin:    Comments: AVF site CDI. No erythema. Right heel boggy  Neurological:     Mental Status: She is alert.     Comments: Oriented to self only. Unable to recall age, DOB or place. Poor recall and decrease insight  into deficits. She was able to follow simple motor commands. No CN deficits. Limited RUE d/t HD catheter. LUE limited d/t pain. Grip/wrist at least 3-4/5 bilaterally. BLE 3- to 3/5 HF, KE and 4/5 ADF/PF. Decreased LT from ankles down bilaterally. No abnl resting tone. DTR's tr to 1+, stable 5/1 Functional mobility: modA toileting     Assessment/Plan: 1. Functional deficits which require 3+ hours per day of interdisciplinary therapy in a comprehensive inpatient rehab setting. Physiatrist is providing close team supervision and 24 hour management of active medical problems listed  below. Physiatrist and rehab team continue to assess barriers to discharge/monitor patient progress toward functional and medical goals  Care Tool:  Bathing    Body parts bathed by patient: Right arm, Left arm, Chest, Abdomen, Front perineal area, Right upper leg, Left upper leg, Right lower leg, Left lower leg, Face   Body parts bathed by helper: Buttocks     Bathing assist Assist Level: Minimal Assistance - Patient > 75%     Upper Body Dressing/Undressing Upper body dressing   What is the patient wearing?: Pull over shirt, Bra    Upper body assist Assist Level: Supervision/Verbal cueing    Lower Body Dressing/Undressing Lower body dressing      What is the patient wearing?: Pants     Lower body assist Assist for lower body dressing: Contact Guard/Touching assist     Toileting Toileting    Toileting assist Assist for toileting: Moderate Assistance - Patient 50 - 74%     Transfers Chair/bed transfer  Transfers assist     Chair/bed transfer assist level: Minimal Assistance - Patient > 75%     Locomotion Ambulation   Ambulation assist      Assist level: Minimal Assistance - Patient > 75% Assistive device: Walker-rolling Max distance: 28ft   Walk 10 feet activity   Assist  Walk 10 feet activity did not occur: Safety/medical concerns  Assist level: Minimal Assistance - Patient > 75% Assistive device: Walker-rolling   Walk 50 feet activity   Assist Walk 50 feet with 2 turns activity did not occur: Safety/medical concerns (pain)         Walk 150 feet activity   Assist Walk 150 feet activity did not occur: Safety/medical concerns (pain)         Walk 10 feet on uneven surface  activity   Assist Walk 10 feet on uneven surfaces activity did not occur: Safety/medical concerns (pain)         Wheelchair     Assist Is the patient using a wheelchair?: Yes Type of Wheelchair: Manual (lightweight)    Wheelchair assist level:  Supervision/Verbal cueing Max wheelchair distance: 171ft    Wheelchair 50 feet with 2 turns activity    Assist        Assist Level: Supervision/Verbal cueing   Wheelchair 150 feet activity     Assist      Assist Level: Supervision/Verbal cueing   Blood pressure (!) 182/76, pulse 86, temperature 98 F (36.7 C), resp. rate 18, height (P) 5\' 4"  (1.626 m), weight 72.5 kg, SpO2 100%.  Medical Problem List and Plan: 1. Functional deficits secondary to debility after acute respiratory failure and multiple medical issues             -patient may not yet shower             -ELOS/Goals: 10-14 days, supervision goals with PT, OT, SLP  Updated Josephus Nida (daughter)  -continue CIR  Grounds pass ordered  2.  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 Acute on chronic LBP/Pain Management: Continue Hydrocodone  prn severe pain--monitor for sedation/confusion.              --Was on IV dilaudid  till 04/16-->now on hydrocodone  10 TID on average.              --increased use of Zanaflex  in past 48 hours.              --continue Gabapentin  300 mg/hs 4. Hx of depression/anxiety/Sleep: LCSW to follow for evaluation and support             --melatonin prn for insomnia.              -antipsychotic agents: N/A             -pt on chronic prozac  20mg  daily and remeron  7.5mg  nightly 5. Neuropsych/cognition: This patient is not capable of making decisions on her own behalf. 6. Skin/Wound Care: Routine pressure relief measures.              -float both heels 7. Fluids/Electrolytes/Nutrition: Strict I/O. Labs with HD             --1200 cc FR/day.  8. Cellulitis/Possible abscess LUE: On Van since 04/19 and Zosyn  since 04/17 --WBC trending down to 10.5 today. BC X 2 from 04/19 pending--negative so far.  --has been afebrile X 24 hours.  Zosyn  changed to Augmentin  by Dr. Sunnie England. End date for Vanc/augmentin   is 4/29              -pt reports that arm is feeling less tender -07/14/23 no fevers but now worsening leukocytosis, WBC 19.7 yesterday and 20.4 on repeat today, LUE not really that concerning looking -continue vancomycin /ceftriaxone  -SEE BELOW #[id number]  9. H/o GIB: Monitor H/H as had further drop. Hgb down to             On IV iron /ESA  -07/13/23 Hgb stable last labs 4/20, monitor labs  -07/14/23 Hgb 8.4, stable/improving; monitor  10. HTN: Monitor BP TID--on Torsemide  on non dialysis days.  -07/20/23 [name] stable, apparently a little soft during dialysis, may need midodrine  on dialysis days per nephro; monitor  Vitals:   07/19/23 0415 07/19/23 0523 07/19/23 0650 07/19/23 0653  BP: (!) 141/67 (!) 144/62 (!) 140/60 (!) (P) 161/67   07/19/23 0836 07/19/23 0845 07/19/23 0900 07/19/23 0915  BP: (!) 162/63 (!) 147/60 (!) 151/70 (!) 165/68   07/19/23 0930 07/19/23 2004 07/20/23 0114 07/20/23 0545  BP: (!) 154/64 (!) 163/64 (!) 163/62 (!) 182/76     11. 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 H/o Gastritis/esophagitis: Most recent admission last month. Continue Protonix  40mg  BID.  --Has required one unit PRBC 04/16.              --monitor for signs of bleeding.   13. Chronic diarrhea:  continue Colestid  1g TID and Creon  36kU tid- changed to after meals as per daughter's request, last BM 5/2  14. H/o cognitive/memory deficits: continue Exelon  1.5mg  bid. Namenda  added  15. ESRD: Now HD dependent--TTS at the end of the day to help with tolerance of therapy             --continue renveal for metabolic bone disease.  Per nephro  -07/20/23 dialysis today, then MWF next week to facilitate d/c  17.  T2DM: Hgb A1c- 6.2 and diet controlled. Add CM restrictions.  -d/c ISS, d/c CBGs  18. Bilateral knee pain: voltaren  gel scheduled, right knee sleeve ordered, continue  -now s/p b/l aspirations by [name]  19. Bowel incontinence: lumbar MRI showed and discussed there is no  evidence of cauda equina  20. New upper extremity weakness: cervical MRI ordered and discussed that there is moderate canal stenosis, discussed with daughter that oral steroids, steroid injection, surgery are possible interventions- deferred at this time given patient's risk factors  21. Right knee effusion: culture reviewed and with 89,000 WBC concerning for septic arthritis, will consult ID, procal elevated, s/p ortho washout, continue ceftriaxone /vanc -per ID, cont vanc/ceftriaxone , await culture results-- tx 4wks from I&D (4/28)   -07/20/23 intra-op cx NGTD x1d; both cx from knee aspirations NGTD x3d  22. Leukocytosis:repeat CBC ordered, procal ordered, consulted ID, continue ceftriaxone /vanc -of note, U/A from 4/28 had budding yeast and looked dirty, but no UCx was done-- might be of benefit? If leukocytosis continues.   23. Anemia: Hgb reviewed and is improved  24. Dementia: added namenda  5mg  daily, continue  25. Neuropathic pain: added namenda  5mg  daily, metanx added, discussed with daughter, continue these  LOS: 12 days A FACE TO FACE EVALUATION WAS PERFORMED  [address] 07/20/2023, 12:42 PM

## 2023-07-20 NOTE — Progress Notes (Signed)
 Ortho Trauma Note  Doing okay. States knee still hurts but feels better than preop. No results on cultures yet. WBC elevated a little from yesterday.   PE: Awake and alert. Dressing clean and dry. Able to bend knee slightly but does grimace with exam. Motor intact distally  A/P 78 yo female with possible septic arthritis s/p I&D  Antibiotics per ID WBAT, ROM as tolerated, can participate with therapy as needed Dressing change tomorrow. Eliquis  for VTE prophylaxis (as she is on baseline) Will continue to follow  Laneta Pintos, MD [facility] [phone] (office) [email or web address]

## 2023-07-20 NOTE — Progress Notes (Signed)
 Patient ID: [name], female   DOB: [birth year], 78 y.o.   MRN: [id number]  HPI:  Pt is a 78 y.o.  with  hypertension, renal vein thrombosis on Eliquis , stroke, type II DM, lupus, CKD 4-5 who was recently admitted for CHF exacerbation and progressive CKD presented back with worsening dyspnea on exertion and fluid overload.    Assessment/Plan:   # Progressive CKD 5 versus AKI on CKD,: With frequent hospitalization for fluid overload.  Responding to IV diuretics but not po and with worsening renal function d/w patient and family (Daughter on phone 76/2) at length recommend starting dialysis. S/p LUE AVG + TDC placement 4/4 -> AVG removed bec of steal -Continue dialysis on TTS schedule this week; BP dropped on Thursday.  Will limit UF to 1 L and give albumin  if needed.  If this continues we need to use midodrine  before dialysis.    On for today; will switch to MWF next week to facilitate dc.  - CLIP complete-  has been accepted at [facility]   # CKD-MBD/hyperphosphatemia: Increased sevelamer  on 4/21 to 2 TIDM. PTH at goal. Repeat on 5/1   #Anemia of CKD: Concern about GI bleed, underwent endoscopy t recently with no sign of bleeding.  Back on Eliquis .  Has required transfusion.  Given IV iron  and ESA   # Hypertension/volume: [name] improved after diuresis.  Torsemide  on non-dialysis days   #A fib: on eliquis    # Vascular access - developed steal syndrome of left hand. - VVS following s/p excision of AVG 07/01/23 due to concern for steal syndrome with improvement of symptoms and ok to resume anticoagulation.    #Disposition -now in CIR  S: Feels well today. S/p I&D Rt knee by Dr. Curtiss Dowdy 5/2. Denies fever, chills, nausea, vomiting.  Appetite decent.  Daughter at bedside updated  O:BP (!) 182/76 (BP Location: Right Arm)   Pulse 86   Temp 98 F (36.7 C)   Resp 18   Ht (P) 5\' 4"  (1.626 m)   Wt 72.5 kg   SpO2 100%   BMI (P) 27.44 kg/m   Intake/Output Summary (Last 24 hours) at  07/20/2023 1116 Last data filed at 07/20/2023 0839 Gross per 24 hour  Intake 433.92 ml  Output --  Net 433.92 ml   Intake/Output: I/O last 3 completed shifts: In: 716.9 [P.O.:400; I.V.:216.9; IV Piggyback:100] Out: 10 [Blood:10]  Intake/Output this shift:  Total I/O In: 117 [P.O.:117] Out: -  Weight change: -1.6 kg Gen: NAD lying in bed CVS: Normal rate, no rub Resp: Bilateral chest rise with no increased work of breathing Abd: +BS, soft, NT/ND Ext: no edema, warm and well-perfused Access: Right IJ TC  Recent Labs  Lab 07/13/23 2115 07/16/23 1354 07/18/23 1045 07/20/23 0553  NA 133* 130* 135 134*  K 3.7 4.0 4.2 4.4  CL 93* 92* 92* 92*  CO2 20* 19* 23 22  GLUCOSE 238* 153* 106* 143*  BUN 95* 94* 72* 63*  CREATININE 8.20* 8.11* 6.72* 6.15*  ALBUMIN  2.0* 2.0* 2.4*  --   CALCIUM  9.5 9.5 10.1 9.9  PHOS  --  4.9* 4.2  --   AST 47*  --   --   --   ALT 48*  --   --   --    Liver Function Tests: Recent Labs  Lab 07/13/23 2115 07/16/23 1354 07/18/23 1045  AST 47*  --   --   ALT 48*  --   --   ALKPHOS  222*  --   --   BILITOT 0.7  --   --   PROT 6.8  --   --   ALBUMIN  2.0* 2.0* 2.4*   No results for input(s): "LIPASE", "AMYLASE" in the last 168 hours. No results for input(s): "AMMONIA" in the last 168 hours. CBC: Recent Labs  Lab 07/15/23 0849 07/16/23 1006 07/16/23 1354 07/17/23 1244 07/18/23 1045 07/20/23 0553  WBC 15.9* 12.6* 14.9* 12.0* 11.6* 14.2*  NEUTROABS 13.7* 10.5*  --  9.2*  --   --   HGB 7.8* 8.3* 7.6* 8.7* 8.2* 8.2*  HCT 25.3* 26.6* 24.9* 27.9* 27.4* 26.6*  MCV 93.0 91.7 92.9 90.9 92.6 92.0  PLT 451* 474* 470* 476* 520* 464*   Cardiac Enzymes: No results for input(s): "CKTOTAL", "CKMB", "CKMBINDEX", "TROPONINI" in the last 168 hours. CBG: Recent Labs  Lab 07/17/23 1657 07/18/23 2103 07/18/23 2209 07/19/23 0657 07/19/23 0836  GLUCAP 161* 162* 194* 92 106*    Iron  Studies: No results for input(s): "IRON ", "TIBC", "TRANSFERRIN",  "FERRITIN" in the last 72 hours. Studies/Results: CT HEAD WO CONTRAST ( ) Result Date: 07/19/2023 EXAM: CT HEAD WITHOUT 07/19/2023 12:01:08 AM TECHNIQUE: CT of the head was performed without the administration of intravenous contrast. Automated exposure control, iterative reconstruction, and/or weight based adjustment of the mA/kV was utilized to reduce the radiation dose to as low as reasonably achievable. COMPARISON: 07/28/2022 CLINICAL HISTORY: Fall. Fall FINDINGS: BRAIN AND VENTRICLES: There is no acute intracranial hemorrhage, mass effect or midline shift. No abnormal extra-axial fluid collection. The gray-white differentiation is maintained without evidence of an acute infarct. There is no evidence of hydrocephalus. Global cortical atrophy. Subcortical and periventricular small vessel ischemic changes. ORBITS: The visualized portion of the orbits demonstrate no acute abnormality. SINUSES: The visualized paranasal sinuses and mastoid air cells demonstrate no acute abnormality. SOFT TISSUES AND SKULL: No acute abnormality of the visualized skull or soft tissues. IMPRESSION: 1. No acute intracranial abnormality. 2. Global cortical atrophy. 3. Subcortical and periventricular small vessel ischemic changes. Electronically signed by: Zadie Herter MD 07/19/2023 12:04 AM EDT RP Workstation: [id number]      apixaban   5 mg Oral BID   B-complex with vitamin C  1 tablet Oral Daily   Chlorhexidine  Gluconate Cloth  6 each Topical Q12H   cholecalciferol   1,000 Units Oral Daily   colestipol   1 g Oral TID AC   darbepoetin (ARANESP ) injection - DIALYSIS  200 mcg Subcutaneous Q Sat-1800   diclofenac  Sodium  2 g Topical QID   docusate sodium   100 mg Oral BID   FLUoxetine   20 mg Oral Daily   gabapentin   300 mg Oral QHS   kidney failure book   Does not apply Once   l-methylfolate-B6-B12  1 tablet Oral Daily   lipase/protease/amylase  36,000 Units Oral TID AC   memantine   5 mg Oral Daily   mirtazapine   7.5  mg Oral QHS   multivitamin  1 tablet Oral QHS   pantoprazole   40 mg Oral BID   rivastigmine   1.5 mg Oral BID   sevelamer  carbonate  1,600 mg Oral TID WC   torsemide   100 mg Oral Q M,W,F   And   torsemide   100 mg Oral Q Sun    BMET    Component Value Date/Time   NA 134 (L) 07/20/2023 0553   NA 144 07/19/2020 1458   NA 140 03/14/2017 0810   K 4.4 07/20/2023 0553   K 3.8 03/14/2017 0810   CL 92 (L)  07/20/2023 0553   CO2 22 07/20/2023 0553   CO2 27 03/14/2017 0810   GLUCOSE 143 (H) 07/20/2023 0553   GLUCOSE 177 (H) 03/14/2017 0810   BUN 63 (H) 07/20/2023 0553   BUN 85 (HH) 07/19/2020 1458   BUN 35.6 (H) 03/14/2017 0810   CREATININE 6.15 (H) 07/20/2023 0553   CREATININE 2.31 (H) 10/25/2022 1355   CREATININE 1.8 (H) 03/14/2017 0810   CALCIUM  9.9 07/20/2023 0553   CALCIUM  8.3 (L) 06/18/2023 0334   CALCIUM  9.6 03/14/2017 0810   GFRNONAA 7 (L) 07/20/2023 0553   GFRNONAA 22 (L) 10/25/2022 1355   GFRNONAA 45 (L) 12/17/2013 1158   GFRAA 21 (L) 12/21/2019 0845   GFRAA 52 (L) 12/17/2013 1158   CBC    Component Value Date/Time   WBC 14.2 (H) 07/20/2023 0553   RBC 2.89 (L) 07/20/2023 0553   HGB 8.2 (L) 07/20/2023 0553   HGB 9.4 (L) 04/18/2023 1434   HGB 9.3 (L) 03/14/2017 0810   HCT 26.6 (L) 07/20/2023 0553   HCT 26.4 (L) 07/21/2019 0809   HCT 29.2 (L) 03/14/2017 0810   PLT 464 (H) 07/20/2023 0553   PLT 235 04/18/2023 1434   PLT 292 03/14/2017 0810   PLT 351 10/11/2016 1139   MCV 92.0 07/20/2023 0553   MCV 81.3 03/14/2017 0810   MCH 28.4 07/20/2023 0553   MCHC 30.8 07/20/2023 0553   RDW 16.9 (H) 07/20/2023 0553   RDW 14.0 03/14/2017 0810   LYMPHSABS 1.3 07/17/2023 1244   LYMPHSABS 1.4 03/14/2017 0810   MONOABS 0.9 07/17/2023 1244   MONOABS 0.4 03/14/2017 0810   EOSABS 0.1 07/17/2023 1244   EOSABS 0.1 03/14/2017 0810   BASOSABS 0.1 07/17/2023 1244   BASOSABS 0.0 03/14/2017 0810

## 2023-07-20 NOTE — Plan of Care (Signed)
  Problem: Consults Goal: RH GENERAL PATIENT EDUCATION Description: See Patient Education module for education specifics. Outcome: Progressing   Problem: RH BOWEL ELIMINATION Goal: RH STG MANAGE BOWEL WITH ASSISTANCE Description: STG Manage Bowel with mod I Assistance. Outcome: Progressing Goal: RH STG MANAGE BOWEL W/MEDICATION W/ASSISTANCE Description: STG Manage Bowel with Medication with mod I Assistance. Outcome: Progressing   Problem: RH SAFETY Goal: RH STG ADHERE TO SAFETY PRECAUTIONS W/ASSISTANCE/DEVICE Description: STG Adhere to Safety Precautions With cues Assistance/Device. Outcome: Progressing   Problem: RH PAIN MANAGEMENT Goal: RH STG PAIN MANAGED AT OR BELOW PT'S PAIN GOAL Description: < 4 with prns Outcome: Progressing

## 2023-07-20 NOTE — Procedures (Signed)
 Received patient in bed to unit.  Alert and oriented.  Informed consent signed and in chart.   TX duration: 3 hours  Patient tolerated well.  Transported back to the room  Alert, without acute distress.  Hand-off given to patient's nurse.   Access used: right cath Access issues: none  Total UF removed: 1 liter Medication(s) given: vancomycin   Clover Dao, RN [facility]

## 2023-07-20 NOTE — Progress Notes (Signed)
 Pt received from dialysis very confused.  This LPN and Zach, LPN, tried to orient pt.  Pt was not able to orient.  Very confused and only alert and oriented to self at this moment. Her daughter states that she is always confused after every dialysis treatment.  Will continue to monitor.

## 2023-07-21 LAB — CBC
HCT: 24.9 % — ABNORMAL LOW (ref 36.0–46.0)
Hemoglobin: 7.7 g/dL — ABNORMAL LOW (ref 12.0–15.0)
MCH: 28.7 pg (ref 26.0–34.0)
MCHC: 30.9 g/dL (ref 30.0–36.0)
MCV: 92.9 fL (ref 80.0–100.0)
Platelets: 419 10*3/uL — ABNORMAL HIGH (ref 150–400)
RBC: 2.68 MIL/uL — ABNORMAL LOW (ref 3.87–5.11)
RDW: 17 % — ABNORMAL HIGH (ref 11.5–15.5)
WBC: 13.1 10*3/uL — ABNORMAL HIGH (ref 4.0–10.5)
nRBC: 0.5 % — ABNORMAL HIGH (ref 0.0–0.2)

## 2023-07-21 MED ORDER — CHLORHEXIDINE GLUCONATE CLOTH 2 % EX PADS
6.0000 | MEDICATED_PAD | Freq: Every day | CUTANEOUS | Status: DC
  Administered 2023-07-21: 6 via TOPICAL

## 2023-07-21 MED ORDER — TORSEMIDE 20 MG PO TABS: 100.0000 mg | ORAL_TABLET | ORAL | Status: DC

## 2023-07-21 MED ORDER — TORSEMIDE 20 MG PO TABS
100.0000 mg | ORAL_TABLET | ORAL | Status: DC
  Administered 2023-07-21 – 2023-08-01 (×7): 100 mg via ORAL
  Filled 2023-07-21 (×7): qty 5

## 2023-07-21 NOTE — Progress Notes (Signed)
 PROGRESS NOTE   Subjective/Complaints:  Pt doing well again today, slept well, pain well managed overall and knees both feeling better. LBM last night. Urinating per usual. Got dialysis yesterday, went fine. No other complaints or concerns.   ROS: +upper and lower extremity weakness- improved, +bowel incontinence, +right knee pain-limits therapy, continues but is betterr. Denies CP, SOB, abd pain, n/v/d/c, +dizziness   Objective:   No results found.    Recent Labs    07/20/23 0553 07/21/23 0726  WBC 14.2* 13.1*  HGB 8.2* 7.7*  HCT 26.6* 24.9*  PLT 464* 419*    Recent Labs    07/18/23 1045 07/20/23 0553  NA 135 134*  K 4.2 4.4  CL 92* 92*  CO2 23 22  GLUCOSE 106* 143*  BUN 72* 63*  CREATININE 6.72* 6.15*  CALCIUM  10.1 9.9     Intake/Output Summary (Last 24 hours) at 07/21/2023 1022 Last data filed at 07/20/2023 1910 Gross per 24 hour  Intake 200 ml  Output 1000 ml  Net -800 ml        Physical Exam: Vital Signs Blood pressure (!) 151/65, pulse 80, temperature 100 F (37.8 C), temperature source Oral, resp. rate 17, height (P) 5\' 4"  (1.626 m), weight 69.5 kg, SpO2 93%.  Gen: no distress, normal appearing, resting in bed.  HEENT: oral mucosa pink and moist, NCAT Cardio: Reg rate, no m/r/g appreciated Chest: normal effort, normal rate of breathing, CTAB Abd: soft, non-distended, +BS throughout, nonTTP Ext/MsK: no edema peripherally, LUE with graft site at L elbow without significant erythema.  R knee wrapped in bulky dressing and ACE wrap. L knee with less tenderness and no effusion.  Psychiatric:     Comments: Pt flat, cooperative, but brighter affect this weekend  PRIOR EXAMS:  Musculoskeletal:        General: Swelling and tenderness (left antecubital region) present.  Right knee swollen and tender    Cervical back: Normal range of motion.  Skin:    Comments: AVF site CDI. No erythema. Right heel  boggy  Neurological:     Mental Status: She is alert.     Comments: Oriented to self only. Unable to recall age, DOB or place. Poor recall and decrease insight into deficits. She was able to follow simple motor commands. No CN deficits. Limited RUE d/t HD catheter. LUE limited d/t pain. Grip/wrist at least 3-4/5 bilaterally. BLE 3- to 3/5 HF, KE and 4/5 ADF/PF. Decreased LT from ankles down bilaterally. No abnl resting tone. DTR's tr to 1+, stable 5/1 Functional mobility: modA toileting     Assessment/Plan: 1. Functional deficits which require 3+ hours per day of interdisciplinary therapy in a comprehensive inpatient rehab setting. Physiatrist is providing close team supervision and 24 hour management of active medical problems listed below. Physiatrist and rehab team continue to assess barriers to discharge/monitor patient progress toward functional and medical goals  Care Tool:  Bathing    Body parts bathed by patient: Right arm, Left arm, Chest, Abdomen, Front perineal area, Right upper leg, Left upper leg, Right lower leg, Left lower leg, Face   Body parts bathed by helper: Buttocks     Bathing assist Assist  Level: Minimal Assistance - Patient > 75%     Upper Body Dressing/Undressing Upper body dressing   What is the patient wearing?: Pull over shirt, Bra    Upper body assist Assist Level: Supervision/Verbal cueing    Lower Body Dressing/Undressing Lower body dressing      What is the patient wearing?: Pants     Lower body assist Assist for lower body dressing: Contact Guard/Touching assist     Toileting Toileting    Toileting assist Assist for toileting: Moderate Assistance - Patient 50 - 74%     Transfers Chair/bed transfer  Transfers assist     Chair/bed transfer assist level: Minimal Assistance - Patient > 75%     Locomotion Ambulation   Ambulation assist      Assist level: Minimal Assistance - Patient > 75% Assistive device:  Walker-rolling Max distance: 64ft   Walk 10 feet activity   Assist  Walk 10 feet activity did not occur: Safety/medical concerns  Assist level: Minimal Assistance - Patient > 75% Assistive device: Walker-rolling   Walk 50 feet activity   Assist Walk 50 feet with 2 turns activity did not occur: Safety/medical concerns (pain)         Walk 150 feet activity   Assist Walk 150 feet activity did not occur: Safety/medical concerns (pain)         Walk 10 feet on uneven surface  activity   Assist Walk 10 feet on uneven surfaces activity did not occur: Safety/medical concerns (pain)         Wheelchair     Assist Is the patient using a wheelchair?: Yes Type of Wheelchair: Manual (lightweight)    Wheelchair assist level: Supervision/Verbal cueing Max wheelchair distance: 124ft    Wheelchair 50 feet with 2 turns activity    Assist        Assist Level: Supervision/Verbal cueing   Wheelchair 150 feet activity     Assist      Assist Level: Supervision/Verbal cueing   Blood pressure (!) 151/65, pulse 80, temperature 100 F (37.8 C), temperature source Oral, resp. rate 17, height (P) 5\' 4"  (1.626 m), weight 69.5 kg, SpO2 93%.  Medical Problem List and Plan: 1. Functional deficits secondary to debility after acute respiratory failure and multiple medical issues             -patient may not yet shower             -ELOS/Goals: 10-14 days, supervision goals with PT, OT, SLP  Updated Josephus Nida (daughter)  -continue CIR  Grounds pass ordered 2.  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 Acute on chronic LBP/Pain Management: Continue Hydrocodone  prn severe pain--monitor for sedation/confusion.              --Was on IV dilaudid  till 04/16-->now on hydrocodone  10 TID on average.              --increased use of Zanaflex  in past 48 hours.               --continue Gabapentin  300 mg/hs 4. Hx of depression/anxiety/Sleep: LCSW to follow for evaluation and support             --melatonin prn for insomnia.              -antipsychotic agents: N/A             -  pt on chronic prozac  20mg  daily and remeron  7.5mg  nightly 5. Neuropsych/cognition: This patient is not capable of making decisions on her own behalf. 6. Skin/Wound Care: Routine pressure relief measures.              -float both heels 7. Fluids/Electrolytes/Nutrition: Strict I/O. Labs with HD             --1200 cc FR/day.  8. Cellulitis/Possible abscess LUE: On Van since 04/19 and Zosyn  since 04/17 --WBC trending down to 10.5 today. BC X 2 from 04/19 pending--negative so far.  --has been afebrile X 24 hours.  Zosyn  changed to Augmentin  by Dr. Sunnie England. End date for Vanc/augmentin  is 4/29              -pt reports that arm is feeling less tender -07/14/23 no fevers but now worsening leukocytosis, WBC 19.7 yesterday and 20.4 on repeat today, LUE not really that concerning looking -continue vancomycin /ceftriaxone  -SEE BELOW #21-22  9. H/o GIB: Monitor H/H as had further drop. Hgb down to             On IV iron /ESA  -07/21/23 Hgb stable 7-8 range  10. HTN: Monitor BP TID--on Torsemide  on non dialysis days.  -07/20/23 [name] stable, apparently a little soft during dialysis, may need midodrine  on dialysis days per nephro; monitor -07/21/23 [name] fine, even a little borderline high at times; monitor  Vitals:   07/20/23 1456 07/20/23 1510 07/20/23 1530 07/20/23 1600  BP: (!) 170/70 (!) 177/72 (!) 114/59 (!) 151/71   07/20/23 1630 07/20/23 1700 07/20/23 1730 07/20/23 1800  BP: (!) 142/63 (!) 159/63 130/68 (!) 148/64   07/20/23 1807 07/20/23 1813 07/20/23 1933 07/21/23 0533  BP: (!) 165/61 (!) 151/64 (!) 162/69 (!) 151/65     11. 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 H/o Gastritis/esophagitis: Most recent admission last month. Continue Protonix  40mg  BID.  --Has required one unit PRBC  04/16.              --monitor for signs of bleeding.   13. Chronic diarrhea:  continue Colestid  1g TID and Creon  36kU tid- changed to after meals as per daughter's request, last BM 5/2  14. H/o cognitive/memory deficits: continue Exelon  1.5mg  bid. Namenda  added  15. ESRD: Now HD dependent--TTS at the end of the day to help with tolerance of therapy             --continue renveal for metabolic bone disease.  Per nephro  -07/20/23 dialysis today, then MWF next week to facilitate d/c  17. T2DM: Hgb A1c- 6.2 and diet controlled. Add CM restrictions.  -d/c ISS, d/c CBGs  18. Bilateral knee pain: voltaren  gel scheduled, right knee sleeve ordered, continue  -now s/p b/l aspirations by [name]  19. Bowel incontinence: lumbar MRI showed and discussed there is no evidence of cauda equina  20. New upper extremity weakness: cervical MRI ordered and discussed that there is moderate canal stenosis, discussed with daughter that oral steroids, steroid injection, surgery are possible interventions- deferred at this time given patient's risk factors  21. Right knee effusion: culture reviewed and with 89,000 WBC concerning for septic arthritis, will consult ID, procal elevated, s/p ortho washout, continue ceftriaxone /vanc -per ID, cont vanc/ceftriaxone , await culture results-- tx 4wks from I&D (4/28)   -07/21/23 intra-op cx NGTD x2d; both cx from knee aspirations NGTD x3d  22. Leukocytosis:repeat CBC ordered, procal ordered, consulted ID, continue ceftriaxone /vanc -of note, U/A from 4/28 had budding yeast  and looked dirty, but no UCx was done-- might be of benefit? If leukocytosis continues.   23. Anemia: Hgb reviewed and is improved-- see #9  24. Dementia: added namenda  5mg  daily, continue  25. Neuropathic pain: added namenda  5mg  daily, metanx added, discussed with daughter, continue these  LOS: 13 days A FACE TO FACE EVALUATION WAS PERFORMED  [address] 07/21/2023, 10:22 AM

## 2023-07-21 NOTE — Progress Notes (Signed)
 Patient ID: [name], female   DOB: Jul 12, 1945, 78 y.o.   MRN: [id number]  HPI:  Pt is a 78 y.o.  with  hypertension, renal vein thrombosis on Eliquis , stroke, type II DM, lupus, CKD 4-5 who was recently admitted for CHF exacerbation and progressive CKD presented back with worsening dyspnea on exertion and fluid overload.    Assessment/Plan:   # Progressive CKD 5 versus AKI on CKD,: With frequent hospitalization for fluid overload.  Responding to IV diuretics but not po and with worsening renal function d/w patient and family (Daughter on phone 4/2) at length recommend starting dialysis. S/p LUE AVG + TDC placement 4/4 -> AVG removed bec of steal  Tolerated dialysis on Saturday with 1 L net UF plus vancomycin  given; will switch to MWF this week to facilitate dc.   - CLIP complete-  has been accepted at [facility]   # CKD-MBD/hyperphosphatemia: Increased sevelamer  on 4/21 to 2 TIDM. PTH at goal. Repeat on 5/1 4.2   #Anemia of CKD: Concern about GI bleed, underwent endoscopy t recently with no sign of bleeding.  Back on Eliquis .  Has required transfusion.  Given IV iron  and ESA. Hb 8.2. Darbo already incr to 200 qwk.   # Hypertension/volume: [name] improved after diuresis.  Torsemide  on non-dialysis days   #A fib: on eliquis    # Vascular access - developed steal syndrome of left hand. - VVS following s/p excision of AVG 07/01/23 due to concern for steal syndrome with improvement of symptoms and ok to resume anticoagulation.    #Disposition -now in CIR  S: Confused after dialysis treatment yesterday which is abnormal for her.  She is much better today and appropriate answering questions.   S/p I&D Rt knee by Dr. Curtiss Dowdy 5/2. Denies fever, chills, nausea, vomiting.  Appetite decent.    O:BP (!) 151/65 (BP Location: Right Arm)   Pulse 80   Temp 100 F (37.8 C) (Oral)   Resp 17   Ht (P) 5\' 4"  (1.626 m)   Wt 69.5 kg   SpO2 93%   BMI (P) 26.30 kg/m   Intake/Output Summary (Last 24  hours) at 07/21/2023 0753 Last data filed at 07/20/2023 1910 Gross per 24 hour  Intake 317 ml  Output 1000 ml  Net -683 ml   Intake/Output: I/O last 3 completed shifts: In: 317 [P.O.:117; IV Piggyback:200] Out: 1000 [Other:1000]  Intake/Output this shift:  No intake/output data recorded. Weight change: 1.2 kg Gen: NAD lying in bed CVS: Normal rate, no rub Resp: Bilateral chest rise with no increased work of breathing Abd: +BS, soft, NT/ND Ext: no edema, warm and well-perfused Access: Right IJ TC  Recent Labs  Lab 07/16/23 1354 07/18/23 1045 07/20/23 0553  NA 130* 135 134*  K 4.0 4.2 4.4  CL 92* 92* 92*  CO2 19* 23 22  GLUCOSE 153* 106* 143*  BUN 94* 72* 63*  CREATININE 8.11* 6.72* 6.15*  ALBUMIN  2.0* 2.4*  --   CALCIUM  9.5 10.1 9.9  PHOS 4.9* 4.2  --    Liver Function Tests: Recent Labs  Lab 07/16/23 1354 07/18/23 1045  ALBUMIN  2.0* 2.4*   No results for input(s): "LIPASE", "AMYLASE" in the last 168 hours. No results for input(s): "AMMONIA" in the last 168 hours. CBC: Recent Labs  Lab 07/15/23 0849 07/16/23 1006 07/16/23 1354 07/17/23 1244 07/18/23 1045 07/20/23 0553  WBC 15.9* 12.6* 14.9* 12.0* 11.6* 14.2*  NEUTROABS 13.7* 10.5*  --  9.2*  --   --  HGB 7.8* 8.3* 7.6* 8.7* 8.2* 8.2*  HCT 25.3* 26.6* 24.9* 27.9* 27.4* 26.6*  MCV 93.0 91.7 92.9 90.9 92.6 92.0  PLT 451* 474* 470* 476* 520* 464*   Cardiac Enzymes: No results for input(s): "CKTOTAL", "CKMB", "CKMBINDEX", "TROPONINI" in the last 168 hours. CBG: Recent Labs  Lab 07/17/23 1657 07/18/23 2103 07/18/23 2209 07/19/23 0657 07/19/23 0836  GLUCAP 161* 162* 194* 92 106*    Iron  Studies: No results for input(s): "IRON ", "TIBC", "TRANSFERRIN", "FERRITIN" in the last 72 hours. Studies/Results: No results found.     apixaban   5 mg Oral BID   B-complex with vitamin C  1 tablet Oral Daily   Chlorhexidine  Gluconate Cloth  6 each Topical Q12H   cholecalciferol   1,000 Units Oral Daily    colestipol   1 g Oral TID AC   darbepoetin (ARANESP ) injection - DIALYSIS  200 mcg Subcutaneous Q Sat-1800   diclofenac  Sodium  2 g Topical QID   docusate sodium   100 mg Oral BID   FLUoxetine   20 mg Oral Daily   gabapentin   300 mg Oral QHS   kidney failure book   Does not apply Once   l-methylfolate-B6-B12  1 tablet Oral Daily   lipase/protease/amylase  36,000 Units Oral TID AC   memantine   5 mg Oral Daily   mirtazapine   7.5 mg Oral QHS   multivitamin  1 tablet Oral QHS   pantoprazole   40 mg Oral BID   rivastigmine   1.5 mg Oral BID   sevelamer  carbonate  1,600 mg Oral TID WC   torsemide   100 mg Oral Q M,W,F   And   torsemide   100 mg Oral Q Sun    BMET    Component Value Date/Time   NA 134 (L) 07/20/2023 0553   NA 144 07/19/2020 1458   NA 140 03/14/2017 0810   K 4.4 07/20/2023 0553   K 3.8 03/14/2017 0810   CL 92 (L) 07/20/2023 0553   CO2 22 07/20/2023 0553   CO2 27 03/14/2017 0810   GLUCOSE 143 (H) 07/20/2023 0553   GLUCOSE 177 (H) 03/14/2017 0810   BUN 63 (H) 07/20/2023 0553   BUN 85 (HH) 07/19/2020 1458   BUN 35.6 (H) 03/14/2017 0810   CREATININE 6.15 (H) 07/20/2023 0553   CREATININE 2.31 (H) 10/25/2022 1355   CREATININE 1.8 (H) 03/14/2017 0810   CALCIUM  9.9 07/20/2023 0553   CALCIUM  8.3 (L) 06/18/2023 0334   CALCIUM  9.6 03/14/2017 0810   GFRNONAA 7 (L) 07/20/2023 0553   GFRNONAA 22 (L) 10/25/2022 1355   GFRNONAA 45 (L) 12/17/2013 1158   GFRAA 21 (L) 12/21/2019 0845   GFRAA 52 (L) 12/17/2013 1158   CBC    Component Value Date/Time   WBC 14.2 (H) 07/20/2023 0553   RBC 2.89 (L) 07/20/2023 0553   HGB 8.2 (L) 07/20/2023 0553   HGB 9.4 (L) 04/18/2023 1434   HGB 9.3 (L) 03/14/2017 0810   HCT 26.6 (L) 07/20/2023 0553   HCT 26.4 (L) 07/21/2019 0809   HCT 29.2 (L) 03/14/2017 0810   PLT 464 (H) 07/20/2023 0553   PLT 235 04/18/2023 1434   PLT 292 03/14/2017 0810   PLT 351 10/11/2016 1139   MCV 92.0 07/20/2023 0553   MCV 81.3 03/14/2017 0810   MCH 28.4 07/20/2023  0553   MCHC 30.8 07/20/2023 0553   RDW 16.9 (H) 07/20/2023 0553   RDW 14.0 03/14/2017 0810   LYMPHSABS 1.3 07/17/2023 1244   LYMPHSABS 1.4 03/14/2017 0810   MONOABS  0.9 07/17/2023 1244   MONOABS 0.4 03/14/2017 0810   EOSABS 0.1 07/17/2023 1244   EOSABS 0.1 03/14/2017 0810   BASOSABS 0.1 07/17/2023 1244   BASOSABS 0.0 03/14/2017 0810

## 2023-07-21 NOTE — Progress Notes (Signed)
 Ortho Trauma Note  Doing okay today, resting comfortably in bed. States knee feels better but still sore. WBC  down from yesterday.  Intra-op cultures with NGTD  PE: Awake and alert. Dressing changed, incision CDI. New mepilex applied. Able to bend knee some but does notes some discomfort with this. Motor intact distally  A/P 78 yo female with possible septic arthritis s/p I&D 07/19/23  Antibiotics per ID WBAT, ROM as tolerated, can participate with therapy as needed Dressing change to continue PRN Eliquis  for VTE prophylaxis (as she is on baseline) Will continue to follow remotely   Edilia Gordon PA-C [facility] [phone] (office) [email or web address]

## 2023-07-22 ENCOUNTER — Encounter (HOSPITAL_COMMUNITY): Payer: Self-pay | Admitting: Student

## 2023-07-22 DIAGNOSIS — M00861 Arthritis due to other bacteria, right knee: Secondary | ICD-10-CM

## 2023-07-22 DIAGNOSIS — M109 Gout, unspecified: Secondary | ICD-10-CM

## 2023-07-22 LAB — CBC WITH DIFFERENTIAL/PLATELET
Abs Immature Granulocytes: 0.65 10*3/uL — ABNORMAL HIGH (ref 0.00–0.07)
Basophils Absolute: 0.1 10*3/uL (ref 0.0–0.1)
Basophils Relative: 0 %
Eosinophils Absolute: 0.1 10*3/uL (ref 0.0–0.5)
Eosinophils Relative: 1 %
HCT: 26.7 % — ABNORMAL LOW (ref 36.0–46.0)
Hemoglobin: 8 g/dL — ABNORMAL LOW (ref 12.0–15.0)
Immature Granulocytes: 5 %
Lymphocytes Relative: 12 %
Lymphs Abs: 1.5 10*3/uL (ref 0.7–4.0)
MCH: 28.2 pg (ref 26.0–34.0)
MCHC: 30 g/dL (ref 30.0–36.0)
MCV: 94 fL (ref 80.0–100.0)
Monocytes Absolute: 0.7 10*3/uL (ref 0.1–1.0)
Monocytes Relative: 6 %
Neutro Abs: 9 10*3/uL — ABNORMAL HIGH (ref 1.7–7.7)
Neutrophils Relative %: 76 %
Platelets: 425 10*3/uL — ABNORMAL HIGH (ref 150–400)
RBC: 2.84 MIL/uL — ABNORMAL LOW (ref 3.87–5.11)
RDW: 17.2 % — ABNORMAL HIGH (ref 11.5–15.5)
WBC: 12 10*3/uL — ABNORMAL HIGH (ref 4.0–10.5)
nRBC: 0.7 % — ABNORMAL HIGH (ref 0.0–0.2)

## 2023-07-22 LAB — RENAL FUNCTION PANEL
Albumin: 2.3 g/dL — ABNORMAL LOW (ref 3.5–5.0)
Anion gap: 16 — ABNORMAL HIGH (ref 5–15)
BUN: 69 mg/dL — ABNORMAL HIGH (ref 8–23)
CO2: 24 mmol/L (ref 22–32)
Calcium: 9.7 mg/dL (ref 8.9–10.3)
Chloride: 97 mmol/L — ABNORMAL LOW (ref 98–111)
Creatinine, Ser: 6.81 mg/dL — ABNORMAL HIGH (ref 0.44–1.00)
GFR, Estimated: 6 mL/min — ABNORMAL LOW (ref >60)
Glucose, Bld: 162 mg/dL — ABNORMAL HIGH (ref 70–99)
Phosphorus: 3.1 mg/dL (ref 2.5–4.6)
Potassium: 5.2 mmol/L — ABNORMAL HIGH (ref 3.5–5.1)
Sodium: 137 mmol/L (ref 135–145)

## 2023-07-22 LAB — OCCULT BLOOD X 1 CARD TO LAB, STOOL: Fecal Occult Bld: POSITIVE — AB

## 2023-07-22 MED ORDER — VANCOMYCIN HCL 750 MG/150ML IV SOLN
INTRAVENOUS | Status: AC
  Filled 2023-07-22: qty 150

## 2023-07-22 MED ORDER — VANCOMYCIN HCL 750 MG/150ML IV SOLN
750.0000 mg | INTRAVENOUS | Status: DC
  Administered 2023-07-22: 750 mg via INTRAVENOUS
  Filled 2023-07-22 (×2): qty 150

## 2023-07-22 MED ORDER — DEXTROSE 5 % IV SOLN
0.5000 g | INTRAVENOUS | Status: DC
  Administered 2023-07-22 – 2023-07-30 (×9): 0.5 g via INTRAVENOUS
  Filled 2023-07-22 (×13): qty 0.5

## 2023-07-22 MED ORDER — HEPARIN SODIUM (PORCINE) 1000 UNIT/ML IJ SOLN
INTRAMUSCULAR | Status: AC
  Filled 2023-07-22: qty 4

## 2023-07-22 NOTE — Progress Notes (Signed)
 [facility]  Date of Admission:  07/08/2023   Principal Problem:   Debility Active Problems:   Septic arthritis [facility])   Pseudogout          Assessment: 78 year old female admitted with: #Right knee septic arthritis #ESRD on HD #Pseudogout of bilateral knees -4/28 synovial fluid with 89K WBC, 92% neutrophils, 7% monocytes with negative cultures.  Noted intracellular monosodium urate crystals.  Patient had been on antibiotics prior to aspiration for concern for possible left upper extremity abscess/cellulitis essentially since 4/8 - Per OR on 5/2 note there was some murky fluid without gross pus when entering the knee joint.  Cultures negative as above. Patient had been on antibiotics as above prior to the OR which may contribute to cultures remaining negative. Recommendations: -Or cultures remain negative - Will plan on vancomycin  and ceftazidime with HD x 4 weeks OR EOT 5/29 - Follow-up with Dr. Levern Reader on 5/29 -Plan relayed to primary -ID will sign off  Evaluation of this patient requires complex antimicrobial therapy evaluation and counseling + isolation needs for disease transmission risk assessment and mitigation    Microbiology:   Antibiotics: Vanc+ ctx Cultures:  Other 5/2 ng x 3 days  SUBJECTIVE: Rsting in bed. Fmailya t bedside Interval: Afebrile overnight, wbc 12k  Review of Systems: Review of Systems  All other systems reviewed and are negative.    Scheduled Meds:  apixaban   5 mg Oral BID   B-complex with vitamin C  1 tablet Oral Daily   Chlorhexidine  Gluconate Cloth  6 each Topical Q12H   cholecalciferol   1,000 Units Oral Daily   colestipol   1 g Oral TID AC   darbepoetin (ARANESP ) injection - DIALYSIS  200 mcg Subcutaneous Q Sat-1800   diclofenac  Sodium  2 g Topical QID   docusate sodium   100 mg Oral BID   FLUoxetine   20 mg Oral Daily   gabapentin   300 mg Oral QHS   kidney failure book   Does not apply Once    l-methylfolate-B6-B12  1 tablet Oral Daily   lipase/protease/amylase  36,000 Units Oral TID AC   memantine   5 mg Oral Daily   mirtazapine   7.5 mg Oral QHS   multivitamin  1 tablet Oral QHS   pantoprazole   40 mg Oral BID   rivastigmine   1.5 mg Oral BID   sevelamer  carbonate  1,600 mg Oral TID WC   torsemide   100 mg Oral Once per day on Sunday Tuesday Thursday Saturday   Continuous Infusions:  cefTAZidime (FORTAZ)  IV     vancomycin      PRN Meds:.acetaminophen , bisacodyl , calcium  carbonate, diphenhydrAMINE , guaiFENesin -dextromethorphan, melatonin, milk and molasses, mouth rinse, oxyCODONE , polyethylene glycol, prochlorperazine  **OR** prochlorperazine  **OR** prochlorperazine , simethicone , tiZANidine  Allergies  Allergen Reactions   Anesthetics, Halogenated Other (See Comments)    Per patient's daughter, she can not tolerate anesthetics.    Etomidate  Other (See Comments)    Heart stopped during anesthesia   Fentanyl  Other (See Comments)    Heart stopped during anesthesia    Percocet [Oxycodone -Acetaminophen ] Other (See Comments)    Headaches    Diazepam Other (See Comments)    Agitation   Lorazepam  Nausea Only   Tramadol  Hcl Nausea And Vomiting   Haloperidol Nausea And Vomiting    **Haldol**   Morphine  Sulfate Nausea Only    agitation   Propoxyphene Nausea And Vomiting    **Darvocet** no longer available   Versed  [Midazolam ] Other (See Comments)  Heart stopped during anesthesia    OBJECTIVE: Vitals:   07/21/23 0618 07/21/23 1503 07/21/23 1942 07/22/23 0609  BP:  (!) 150/63 (!) 129/52 (!) 152/57  Pulse:  81 83 77  Resp:  17 16 16   Temp:  99.1 F (37.3 C) 98.7 F (37.1 C) 98 F (36.7 C)  TempSrc:  Oral  Axillary  SpO2:  100% 94% 100%  Weight: 69.5 kg   72.9 kg  Height:       Body mass index is 27.59 kg/m (pended).  Physical Exam Constitutional:      Appearance: Normal appearance.  HENT:     Head: Normocephalic and atraumatic.     Right Ear: Tympanic membrane  normal.     Left Ear: Tympanic membrane normal.     Nose: Nose normal.     Mouth/Throat:     Mouth: Mucous membranes are moist.  Eyes:     Extraocular Movements: Extraocular movements intact.     Conjunctiva/sclera: Conjunctivae normal.     Pupils: Pupils are equal, round, and reactive to light.  Cardiovascular:     Rate and Rhythm: Normal rate and regular rhythm.     Heart sounds: No murmur heard.    No friction rub. No gallop.  Pulmonary:     Effort: Pulmonary effort is normal.     Breath sounds: Normal breath sounds.  Abdominal:     General: Abdomen is flat.     Palpations: Abdomen is soft.  Musculoskeletal:        General: Normal range of motion.  Skin:    General: Skin is warm and dry.  Neurological:     General: No focal deficit present.     Mental Status: She is alert and oriented to person, place, and time.  Psychiatric:        Mood and Affect: Mood normal.       Lab Results Lab Results  Component Value Date   WBC 12.0 (H) 07/22/2023   HGB 8.0 (L) 07/22/2023   HCT 26.7 (L) 07/22/2023   MCV 94.0 07/22/2023   PLT 425 (H) 07/22/2023    Lab Results  Component Value Date   CREATININE 6.15 (H) 07/20/2023   BUN 63 (H) 07/20/2023   NA 134 (L) 07/20/2023   K 4.4 07/20/2023   CL 92 (L) 07/20/2023   CO2 22 07/20/2023    Lab Results  Component Value Date   ALT 48 (H) 07/13/2023   AST 47 (H) 07/13/2023   ALKPHOS 222 (H) 07/13/2023   BILITOT 0.7 07/13/2023        Orlie Bjornstad, MD [facility] 07/22/2023, 1:59 PM

## 2023-07-22 NOTE — Progress Notes (Signed)
 Occupational Therapy Session Note  Patient Details  Name: [name] MRN: [id number] Date of Birth: 1945/04/15  Today's Date: 07/22/2023 OT Individual Time: 1050-1200 OT Individual Time Calculation (min): 70 min    Short Term Goals: Week 2:  OT Short Term Goal 1 (Week 2): Pt will complete sit > stand in prep for ADL with CGA using LRAD OT Short Term Goal 2 (Week 2): Pt will complete toilet transfer with CGA using LRAD OT Short Term Goal 3 (Week 2): Pt will follow 1 step commands to complete LB dressing with min A  Skilled Therapeutic Interventions/Progress Updates:  Skilled OT intervention completed with focus on R knee ROM, pain management, cognitive strategies, sit > stands, activity tolerance, and DC planning. Pt received seated in w/c, agreeable to session. 8/10 pain reported in R knee; per nursing, pt not due for meds for another 1-2 hours. OT applied topical heat to R knee, offered modified therapy and repositioning throughout for pain reduction.  Originally planned for vendor to drop off custom loaner w/c however rep did not show. Communicated to CSW and will follow up with team. Family also noted to not be present for session with upcoming DC in a couple days. Communicated with team that current DC is still recommended, as therapy team accommodated an extension to 5/8 only for ramp installation and family education due to limited progress considering by pt's cognitive deficits > R knee pain. Will attempt to provide appropriate education in timely manner if family is willing to attend.   Pt declined self care needs. Donned slip on shoes with max A. Transported dependently in w/c <> gym. Pt verbalized fatigue/pain from previous session requesting to rest for first part of session.  Seated at table top, pt completed the following activities to address [facility] and cognitive (problem solving, awareness) skills needed for independence with BADLs: -connect 4 game. On first round, pt needed  max to total A for learning the game. No fine motor deficits noted. On 2nd round, pt was able to play against OT with min/mod questioning cues and increased time, but at a more functional level -matching moderately complex design with ping pong balls in egg carton. Pt required min A for task, with noted difficulty with problem solving  Pt completed the following ROM exercises on R knee for pain management in prep for mobility: -2x15 SAQ -2x15 heel slides for knee flexion with foot on towel (more painful at or beyond 90 degrees which causes the greatest difficulty for sit > stands) -2x15 dorsiflexion  Upon first attempt with sit > stand, pt verbalized being unable due to pain. Encouraged pt to continue to trial, with pt able to power up with heavy mod A using RW. Able to march in place x15 with CGA for balance with less pain reported once up.   Pt remained seated in w/c, with belt and telesitter alarm on/activated, and with all needs in reach at end of session.   Therapy Documentation Precautions:  Precautions Precautions: Fall Precaution/Restrictions Comments: dementia Restrictions Weight Bearing Restrictions Per Provider Order: No   Therapy/Group: Individual Therapy  Ruthanna Covert, MS, OTR/L  07/22/2023, 12:11 PM

## 2023-07-22 NOTE — Plan of Care (Signed)
  Problem: RH Balance Goal: LTG Patient will maintain dynamic standing with ADLs (OT) Description: LTG:  Patient will maintain dynamic standing balance with assist during activities of daily living (OT)  Flowsheets (Taken 07/22/2023 1226) LTG: Pt will maintain dynamic standing balance during ADLs with: (downgraded due to slower than anticipated progress) Contact Guard/Touching assist   Problem: Sit to Stand Goal: LTG:  Patient will perform sit to stand in prep for activites of daily living with assistance level (OT) Description: LTG:  Patient will perform sit to stand in prep for activites of daily living with assistance level (OT) Flowsheets (Taken 07/22/2023 1226) LTG: PT will perform sit to stand in prep for activites of daily living with assistance level: (downgraded due to slower than anticipated progress) Minimal Assistance - Patient > 75%   Problem: RH Bathing Goal: LTG Patient will bathe all body parts with assist levels (OT) Description: LTG: Patient will bathe all body parts with assist levels (OT) Flowsheets (Taken 07/22/2023 1226) LTG: Pt will perform bathing with assistance level/cueing: (downgraded due to slower than anticipated progress) Minimal Assistance - Patient > 75%   Problem: RH Dressing Goal: LTG Patient will perform lower body dressing w/assist (OT) Description: LTG: Patient will perform lower body dressing with assist, with/without cues in positioning using equipment (OT) Flowsheets (Taken 07/22/2023 1226) LTG: Pt will perform lower body dressing with assistance level of: (downgraded due to slower than anticipated progress) Minimal Assistance - Patient > 75%   Problem: RH Toileting Goal: LTG Patient will perform toileting task (3/3 steps) with assistance level (OT) Description: LTG: Patient will perform toileting task (3/3 steps) with assistance level (OT)  Flowsheets (Taken 07/22/2023 1226) LTG: Pt will perform toileting task (3/3 steps) with assistance level: (downgraded  due to slower than anticipated progress) Minimal Assistance - Patient > 75%   Problem: RH Toilet Transfers Goal: LTG Patient will perform toilet transfers w/assist (OT) Description: LTG: Patient will perform toilet transfers with assist, with/without cues using equipment (OT) Flowsheets (Taken 07/22/2023 1226) LTG: Pt will perform toilet transfers with assistance level of: (downgraded due to slower than anticipated progress) Contact Guard/Touching assist   Problem: RH Tub/Shower Transfers Goal: LTG Patient will perform tub/shower transfers w/assist (OT) Description: LTG: Patient will perform tub/shower transfers with assist, with/without cues using equipment (OT) Flowsheets (Taken 07/22/2023 1226) LTG: Pt will perform tub/shower stall transfers with assistance level of: (downgraded due to slower than anticipated progress) Minimal Assistance - Patient > 75%   Problem: RH Memory Goal: LTG Patient will demonstrate ability for day to day recall/carry over during activities of daily living with assistance level (OT) Description: LTG:  Patient will demonstrate ability for day to day recall/carry over during activities of daily living with assistance level (OT). Flowsheets (Taken 07/22/2023 1226) LTG:  Patient will demonstrate ability for day to day recall/carry over during activities of daily living with assistance level (OT): (downgraded due to slower than anticipated progress) Minimal Assistance - Patient > 75%   Problem: RH Awareness Goal: LTG: Patient will demonstrate awareness during functional activites type of (OT) Description: LTG: Patient will demonstrate awareness during functional activites type of (OT) Flowsheets (Taken 07/22/2023 1226) Patient will demonstrate awareness during functional activites type of: Intellectual LTG: Patient will demonstrate awareness during functional activites type of (OT): (downgraded due to slower than anticipated progress) Minimal Assistance - Patient > 75%

## 2023-07-22 NOTE — Progress Notes (Signed)
 Physical Therapy Session Note  Patient Details  Name: [name] MRN: [id number] Date of Birth: February 20, 1946  Today's Date: 07/22/2023 PT Individual Time: 0901-0956 PT Individual Time Calculation (min): 55 min   Short Term Goals: Week 2:  PT Short Term Goal 1 (Week 2): STG=LTG due to LOS  Skilled Therapeutic Interventions/Progress Updates:      Pt seated on BSC over toilet with tech upon arrival. Pt agreeable to therapy. Pt reports R knee pain, nurse present to administer medication at start of session. Therapist provided rest breaks and repositioning throughout session and donned R knee brace and ice to R knee at end of session, nurse notified.   Therapist managing IV throughout session.   Pt continent of urine. Pt performed stand step transfer with use of grab bar and heavy min A, verbal cues provided for technique.   Pt donned pants with use of figure 4 positioning with assist to feed through L LE, and to donn over buttocks while standing with RW and min A 2/2 posterior bias.   Pt performed sit to stand throughout session with RW and min A, verbal cues provided for UE positioning and anterior weight shift. Verbal and tactile cues provided for hip and knee extension/upright posture once in standing as crouched.   Pt performed standing marching x5 B with RW and min A, verbal and tactile cues provided for upright posture, and technique.   Pt ambulated 2x10 feet with RW and +1 min A with WC in tow with therapist assisting with advancement of RW. Verbal cues provided for big steps and standing up tall. Pt required seated rest break 2/2 pain/fatigue.   Pt seated in [facility] with all needs within reach and seatbelt alarm on.     Therapy Documentation Precautions:  Precautions Precautions: Fall Precaution/Restrictions Comments: dementia Restrictions Weight Bearing Restrictions Per Provider Order: No  Therapy/Group: Individual Therapy  [facility] Grover, [place],  DPT  07/22/2023, 7:52 AM

## 2023-07-22 NOTE — Progress Notes (Signed)
 PROGRESS NOTE   Subjective/Complaints: No new complaints this morning from patient or nursing.  Appreciate ortho follow-up yesterday, intra-op cultures with no growth so far  ROS: +upper and lower extremity weakness- improved, +bowel incontinence, +right knee pain-limits therapy, continues but is betterr. Denies CP, SOB, abd pain, n/v/d/c, +dizziness   Objective:   No results found.    Recent Labs    07/20/23 0553 07/21/23 0726  WBC 14.2* 13.1*  HGB 8.2* 7.7*  HCT 26.6* 24.9*  PLT 464* 419*    Recent Labs    07/20/23 0553  NA 134*  K 4.4  CL 92*  CO2 22  GLUCOSE 143*  BUN 63*  CREATININE 6.15*  CALCIUM  9.9     Intake/Output Summary (Last 24 hours) at 07/22/2023 1011 Last data filed at 07/22/2023 0719 Gross per 24 hour  Intake 590 ml  Output --  Net 590 ml        Physical Exam: Vital Signs Blood pressure (!) 152/57, pulse 77, temperature 98 F (36.7 C), temperature source Axillary, resp. rate 16, height (P) 5\' 4"  (1.626 m), weight 72.9 kg, SpO2 100%.  Gen: no distress, normal appearing, resting in bed.  HEENT: oral mucosa pink and moist, NCAT Cardio: Reg rate, no m/r/g appreciated Chest: normal effort, normal rate of breathing, CTAB Abd: soft, non-distended, +BS throughout, nonTTP Ext/MsK: no edema peripherally, LUE with graft site at L elbow without significant erythema.  R knee wrapped in bulky dressing and ACE wrap. L knee with less tenderness and no effusion.  Psychiatric:     Comments: Pt flat, cooperative, but brighter affect this weekend  PRIOR EXAMS:  Musculoskeletal:        General: Swelling and tenderness (left antecubital region) present.  Right knee swollen and tender    Cervical back: Normal range of motion.  Skin:    Comments: AVF site CDI. No erythema. Right heel boggy  Neurological:     Mental Status: She is alert.     Comments: Oriented to self only. Unable to recall age, DOB  or place. Poor recall and decrease insight into deficits. She was able to follow simple motor commands. No CN deficits. Limited RUE d/t HD catheter. LUE limited d/t pain. Grip/wrist at least 3-4/5 bilaterally. BLE 3- to 3/5 HF, KE and 4/5 ADF/PF. Decreased LT from ankles down bilaterally. No abnl resting tone. DTR's tr to 1+, stable 5/5 Functional mobility: modA toileting     Assessment/Plan: 1. Functional deficits which require 3+ hours per day of interdisciplinary therapy in a comprehensive inpatient rehab setting. Physiatrist is providing close team supervision and 24 hour management of active medical problems listed below. Physiatrist and rehab team continue to assess barriers to discharge/monitor patient progress toward functional and medical goals  Care Tool:  Bathing    Body parts bathed by patient: Right arm, Left arm, Chest, Abdomen, Front perineal area, Right upper leg, Left upper leg, Right lower leg, Left lower leg, Face   Body parts bathed by helper: Buttocks     Bathing assist Assist Level: Minimal Assistance - Patient > 75%     Upper Body Dressing/Undressing Upper body dressing   What is the patient wearing?:  Pull over shirt, Bra    Upper body assist Assist Level: Supervision/Verbal cueing    Lower Body Dressing/Undressing Lower body dressing      What is the patient wearing?: Pants     Lower body assist Assist for lower body dressing: Contact Guard/Touching assist     Toileting Toileting    Toileting assist Assist for toileting: Moderate Assistance - Patient 50 - 74%     Transfers Chair/bed transfer  Transfers assist     Chair/bed transfer assist level: Minimal Assistance - Patient > 75%     Locomotion Ambulation   Ambulation assist      Assist level: Minimal Assistance - Patient > 75% Assistive device: Walker-rolling Max distance: 25ft   Walk 10 feet activity   Assist  Walk 10 feet activity did not occur: Safety/medical  concerns  Assist level: Minimal Assistance - Patient > 75% Assistive device: Walker-rolling   Walk 50 feet activity   Assist Walk 50 feet with 2 turns activity did not occur: Safety/medical concerns (pain)         Walk 150 feet activity   Assist Walk 150 feet activity did not occur: Safety/medical concerns (pain)         Walk 10 feet on uneven surface  activity   Assist Walk 10 feet on uneven surfaces activity did not occur: Safety/medical concerns (pain)         Wheelchair     Assist Is the patient using a wheelchair?: Yes Type of Wheelchair: Manual (lightweight)    Wheelchair assist level: Supervision/Verbal cueing Max wheelchair distance: 140ft    Wheelchair 50 feet with 2 turns activity    Assist        Assist Level: Supervision/Verbal cueing   Wheelchair 150 feet activity     Assist      Assist Level: Supervision/Verbal cueing   Blood pressure (!) 152/57, pulse 77, temperature 98 F (36.7 C), temperature source Axillary, resp. rate 16, height (P) 5\' 4"  (1.626 m), weight 72.9 kg, SpO2 100%.  Medical Problem List and Plan: 1. Functional deficits secondary to debility after acute respiratory failure and multiple medical issues             -patient may not yet shower             -ELOS/Goals: 10-14 days, supervision goals with PT, OT, SLP  Updated Josephus Nida (daughter)  -continue CIR  Grounds pass ordered 2.  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 Acute on chronic LBP/Pain Management: Continue Hydrocodone  prn severe pain--monitor for sedation/confusion.              --Was on IV dilaudid  till 04/16-->now on hydrocodone  10 TID on average.              --increased use of Zanaflex  in past 48 hours.              --continue Gabapentin  300 mg/hs 4. Hx of depression/anxiety/Sleep: LCSW to follow for evaluation and support              --melatonin prn for insomnia.              -antipsychotic agents: N/A             -pt on chronic prozac  20mg  daily and remeron  7.5mg  nightly 5. Neuropsych/cognition: This patient is not capable of making decisions on her own behalf.  6. Skin/Wound Care: Routine pressure relief measures.              -float both heels 7. Fluids/Electrolytes/Nutrition: Strict I/O. Labs with HD             --1200 cc FR/day.  8. Cellulitis/Possible abscess LUE: On Van since 04/19 and Zosyn  since 04/17 --WBC trending down to 10.5 today. BC X 2 from 04/19 pending--negative so far.  --has been afebrile X 24 hours.  Zosyn  changed to Augmentin  by Dr. Sunnie England. End date for Vanc/augmentin  is 4/29              -pt reports that arm is feeling less tender -07/14/23 no fevers but now worsening leukocytosis, WBC 19.7 yesterday and 20.4 on repeat today, LUE not really that concerning looking -continue vancomycin /ceftriaxone  -SEE BELOW #[id number]  9. H/o GIB: Monitor H/H as had further drop. Hgb down to             On IV iron /ESA  -07/21/23 Hgb stable 7-8 range  10. HTN: Monitor BP TID--on Torsemide  on non dialysis days.  -07/20/23 [name] stable, apparently a little soft during dialysis, may need midodrine  on dialysis days per nephro; monitor -07/21/23 [name] fine, even a little borderline high at times; monitor  Vitals:   07/20/23 1600 07/20/23 1630 07/20/23 1700 07/20/23 1730  BP: (!) 151/71 (!) 142/63 (!) 159/63 130/68   07/20/23 1800 07/20/23 1807 07/20/23 1813 07/20/23 1933  BP: (!) 148/64 (!) 165/61 (!) 151/64 (!) 162/69   07/21/23 0533 07/21/23 1503 07/21/23 1942 07/22/23 0609  BP: (!) 151/65 (!) 150/63 (!) 129/52 (!) 152/57     11. 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 H/o Gastritis/esophagitis: Most recent admission last month. Continue Protonix  40mg  BID.  --Has required one unit PRBC 04/16.              --monitor for signs of bleeding.   13. Chronic diarrhea:  continue Colestid  1g TID and Creon  36kU  tid- changed to after meals as per daughter's request, last BM 5/2  14. H/o cognitive/memory deficits: continue Exelon  1.5mg  bid. Namenda  added  15. ESRD: Now HD dependent--TTS at the end of the day to help with tolerance of therapy             --continue renveal for metabolic bone disease.  Per nephro  -07/20/23 dialysis today, then MWF next week to facilitate d/c  17. T2DM: Hgb A1c- 6.2 and diet controlled. Add CM restrictions.  -d/c ISS, d/c CBGs  18. Bilateral knee pain: voltaren  gel scheduled, right knee sleeve ordered, continue  -now s/p b/l aspirations by [name]  19. Bowel incontinence: lumbar MRI showed and discussed there is no evidence of cauda equina  20. New upper extremity weakness: cervical MRI ordered and discussed that there is moderate canal stenosis, discussed with daughter that oral steroids, steroid injection, surgery are possible interventions- deferred at this time given patient's risk factors  21. Right knee effusion: culture reviewed and with 89,000 WBC concerning for septic arthritis, will consult ID, procal elevated, s/p ortho washout, continue ceftriaxone /vanc -per ID, cont vanc/ceftriaxone , await culture results-- tx 4wks from I&D (4/28)   -07/21/23 intra-op cx NGTD x2d; both cx from knee aspirations NGTD x3d  22. Leukocytosis:repeat CBC ordered, procal ordered, consulted ID, continue ceftriaxone /vanc Intra-op cultures reviewed and with NGTD  23. Anemia: Hgb downstrending, repeat and stool occult ordered  24. Dementia: added namenda  5mg  daily, continue  25. Neuropathic pain: added namenda  5mg  daily,  metanx added, discussed with daughter, continue these  LOS: 14 days A FACE TO FACE EVALUATION WAS PERFORMED  Lizett Chowning P Arisha Gervais 07/22/2023, 10:11 AM

## 2023-07-22 NOTE — Plan of Care (Signed)
  Problem: Consults Goal: RH GENERAL PATIENT EDUCATION Description: See Patient Education module for education specifics. Outcome: Progressing   Problem: RH BOWEL ELIMINATION Goal: RH STG MANAGE BOWEL WITH ASSISTANCE Description: STG Manage Bowel with mod I Assistance. Outcome: Progressing Goal: RH STG MANAGE BOWEL W/MEDICATION W/ASSISTANCE Description: STG Manage Bowel with Medication with mod I Assistance. Outcome: Progressing   Problem: RH SAFETY Goal: RH STG ADHERE TO SAFETY PRECAUTIONS W/ASSISTANCE/DEVICE Description: STG Adhere to Safety Precautions With cues Assistance/Device. Outcome: Progressing   Problem: RH PAIN MANAGEMENT Goal: RH STG PAIN MANAGED AT OR BELOW PT'S PAIN GOAL Description: < 4 with prns Outcome: Progressing   Problem: RH KNOWLEDGE DEFICIT GENERAL Goal: RH STG INCREASE KNOWLEDGE OF SELF CARE AFTER HOSPITALIZATION Description: Patient and family will be able to manage care at discharge using educational resources for medications, dietary modification recommendations independently Outcome: Progressing   Problem: Fluid Volume: Goal: Compliance with measures to maintain balanced fluid volume will improve Description: Patient and family will be able to manage care at discharge using educational resources for 1200 cc fluid restriction/ dietary modification recommendations independently Outcome: Progressing   Problem: Health Behavior/Discharge Planning: Goal: Ability to manage health-related needs will improve Outcome: Progressing   Problem: Education: Goal: Knowledge of disease and its progression will improve Outcome: Progressing Goal: Individualized Educational Video(s) Outcome: Progressing

## 2023-07-22 NOTE — Discharge Planning (Signed)
 [facility]  Initial Hemodialysis Orders  Dialysis center: [facility]  Patient's name: [name] DOB: 1946-02-10 AKI or ESRD: ESRD  Admission Date: 07/08/23 Anticipated Discharge Date: 07/25/22  Past Medical History: Anemia of CKD Hx recent GI bleed A-fib on Eliquis  HTN SLE T2DM Hx CVA Hx renal vein thrombosis  Discharge diagnosis: New ESRD; HD initiated Right knee Septic Arthritis - On IV ABX's see below Functional debility s/p acute hypoxic respiratory failure - received PT/OT on inpatient rehab unit   Allergies:  Allergies  Allergen Reactions   Anesthetics, Halogenated Other (See Comments)    Per patient's daughter, she can not tolerate anesthetics.    Etomidate  Other (See Comments)    Heart stopped during anesthesia   Fentanyl  Other (See Comments)    Heart stopped during anesthesia    Percocet [Oxycodone -Acetaminophen ] Other (See Comments)    Headaches    Diazepam Other (See Comments)    Agitation   Lorazepam  Nausea Only   Tramadol  Hcl Nausea And Vomiting   Haloperidol Nausea And Vomiting    **Haldol**   Morphine  Sulfate Nausea Only    agitation   Propoxyphene Nausea And Vomiting    **Darvocet** no longer available   Versed  [Midazolam ] Other (See Comments)    Heart stopped during anesthesia    Date of First Dialysis: 1st HD 06/21/23 Cause of renal disease: HTN vs. Cardiorenal syndrome   Dialysis Prescription: Dialysis Frequency: Mon/Tues/Thurs/Fri Tx duration: 3hrs BFR: 400 DFR: Auto 1.5 EDW: 72kg  Dialyzer: 180NRe UF profile/Sodium modeling?: None Dialysis Bath: 2 K/2 Ca  Dialysis access: R TDC placed by Dr. Vikki Graves on 06/21/23 S/p L AVG excised d/t steal syndrome  In Center Medications: Heparin  Dose: 2000 unit bolus with HD  VDRA: Calcitriol: per protocol Venofer : NO FE WHILE ON ABXs!!!!!!!! Aranesp  given on 07/20/23. Change to Mircera: 225mcg every 2 weeks. Next dose due: 07/27/23 IV Vancomycin  750mg   and Ceftazidime 1GM with HD until 08/16/23  *Doesn't appear she received blood transfusion during this admission*  Discharge labs: Hgb: 8.0  K+: 4.4  Ca: 9.9  Phos: 4.2  Alb: 2.4  Please obtain weekly Vanc troughs per protocol. See #5 for ABX orders!!!!!  Jadene Maxwell, NP

## 2023-07-22 NOTE — Progress Notes (Signed)
 PHARMACY CONSULT NOTE FOR:  OUTPATIENT  PARENTERAL ANTIBIOTIC THERAPY with Dialysis   Indication: Right knee septic arthritis  Regimen: Vancomycin  750 mg IV each HD + Ceftazidime 1 gm IV each HD  End date: 08/16/23   No formal OPAT will be done as patient will receive antibiotics with dialysis - Nephrology aware.   Thank you for allowing pharmacy to be a part of this patient's care.  Denson Flake, PharmD, BCPS, BCIDP Infectious Diseases Clinical Pharmacist Phone: [phone] 07/22/2023, 2:02 PM

## 2023-07-22 NOTE — Progress Notes (Signed)
 Stool sample collected and sent to lab for occult-need 2 more samples notified oncoming nurse. Randeen Busman, LPN

## 2023-07-22 NOTE — Progress Notes (Signed)
 Patient ID: [name], female   DOB: Jun 23, 1945, 78 y.o.   MRN: [id number] [facility] Progress Note   Assessment/ Plan:   1.  Functional debility/deficits status post acute hypoxic respiratory failure/uremia: Currently admitted to inpatient rehabilitation unit for intensive physical therapy and Occupational Therapy with which she is making progress. 2.  New start ESRD: With recent frequent hospitalizations for volume overload responding to intravenous diuretics but currently at a point where she needed to start dialysis with worsening uremia/volume excess.  This week been transitioned to MWF dialysis schedule and will have low BFR/DFR with limited UF at dialysis to limit hemodynamic and electrolyte fluctuations that may be contributing to her postdialysis confusion. 3. Anemia: With recent EGD that did not show any evidence of bleeding.  On chronic Eliquis  for history of renal vein thrombosis.  On ESA. 4. CKD-MBD: Calcium  and phosphorus levels currently at goal, continue to monitor on renal diet and sevelamer .  PTH at goal. 5. Nutrition: Continue renal diet with fluid restriction and ongoing protein supplementation. 6. Hypertension: Blood pressures currently elevated, continue to monitor with hemodialysis/UF.  Subjective:   Family concerned about postdialysis confusion; this is happened after her last 2 dialysis treatments where she is only oriented to herself and immediate family members but otherwise confused.   Objective:   BP (!) 152/57 (BP Location: Right Arm)   Pulse 77   Temp 98 F (36.7 C) (Axillary)   Resp 16   Ht (P) 5\' 4"  (1.626 m)   Wt 72.9 kg   SpO2 100%   BMI (P) 27.59 kg/m   Physical Exam: Gen: Comfortably sitting up in wheelchair CVS: Pulse regular rhythm, normal rate, S1 and S2 normal Resp: Clear to auscultation bilaterally, no distinct rales or rhonchi.  Right IJ TDC in place Abd: Soft, flat, nontender, bowel sounds normal Ext: Trace ankle  edema.  Left upper arm AV graft excised.  Labs: BMET Recent Labs  Lab 07/16/23 1354 07/18/23 1045 07/20/23 0553  NA 130* 135 134*  K 4.0 4.2 4.4  CL 92* 92* 92*  CO2 19* 23 22  GLUCOSE 153* 106* 143*  BUN 94* 72* 63*  CREATININE 8.11* 6.72* 6.15*  CALCIUM  9.5 10.1 9.9  PHOS 4.9* 4.2  --    CBC Recent Labs  Lab 07/16/23 1006 07/16/23 1354 07/17/23 1244 07/18/23 1045 07/20/23 0553 07/21/23 0726  WBC 12.6*   < > 12.0* 11.6* 14.2* 13.1*  NEUTROABS 10.5*  --  9.2*  --   --   --   HGB 8.3*   < > 8.7* 8.2* 8.2* 7.7*  HCT 26.6*   < > 27.9* 27.4* 26.6* 24.9*  MCV 91.7   < > 90.9 92.6 92.0 92.9  PLT 474*   < > 476* 520* 464* 419*   < > = values in this interval not displayed.      Medications:     apixaban   5 mg Oral BID   B-complex with vitamin C  1 tablet Oral Daily   Chlorhexidine  Gluconate Cloth  6 each Topical Q12H   cholecalciferol   1,000 Units Oral Daily   colestipol   1 g Oral TID AC   darbepoetin (ARANESP ) injection - DIALYSIS  200 mcg Subcutaneous Q Sat-1800   diclofenac  Sodium  2 g Topical QID   docusate sodium   100 mg Oral BID   FLUoxetine   20 mg Oral Daily   gabapentin   300 mg Oral QHS   kidney failure book   Does not apply  Once   l-methylfolate-B6-B12  1 tablet Oral Daily   lipase/protease/amylase  36,000 Units Oral TID AC   memantine   5 mg Oral Daily   mirtazapine   7.5 mg Oral QHS   multivitamin  1 tablet Oral QHS   pantoprazole   40 mg Oral BID   rivastigmine   1.5 mg Oral BID   sevelamer  carbonate  1,600 mg Oral TID WC   torsemide   100 mg Oral Once per day on Sunday Tuesday Thursday Saturday   Clevester Dally, MD 07/22/2023, 10:06 AM

## 2023-07-22 NOTE — Progress Notes (Signed)
 Occupational Therapy Note  Patient Details  Name: Meliza Swaziland Skowronek MRN: [id number] Date of Birth: 11-Feb-1946  Today's Date: 07/22/2023 OT Missed Time: 60 Minutes Missed Time Reason: Unavailable (comment);Other (comment) (unplanned HD)  Pt missed 60 mins of group session d/t unplanned HD session.    Mollie Anger [facility] 07/22/2023, 3:18 PM

## 2023-07-22 NOTE — Progress Notes (Signed)
 Occupational Therapy Weekly Progress Note  Patient Details  Name: [name] MRN: [id number] Date of Birth: Sep 01, 1945  Beginning of progress report period: July 15, 2023 End of progress report period: Jul 22, 2023  Patient has met 1 of 3 short term goals. Pt is making very limited progress towards LTGs. She is able to bathe at an overall min A level, dress with min A and requires min/mod assist for toileting tasks. Pt continues to demonstrate significant R knee pain, difficulty powering up into stance due to limited R knee flexion, cognitive deficits (problem solving, awareness, sequencing, memory), functional endurance and GM/FM coordination impairments resulting in difficulty completing BADL tasks without increased physical assist. Pt will benefit from continued skilled OT services to focus on mentioned deficits. Pt's DC date was extended last week to 5/8 upon family request, to allow time for family education and ramp installation as well as due to missed therapy. However as of date, family has not been present for education.   Patient continues to demonstrate the following deficits: muscle weakness, decreased cardiorespiratoy endurance, decreased coordination, decreased initiation, decreased awareness, decreased problem solving, decreased safety awareness, decreased memory, and delayed processing, and decreased standing balance and therefore will continue to benefit from skilled OT intervention to enhance overall performance with BADL and Reduce care partner burden.  Patient not progressing toward long term goals.  See goal revision..  Plan of care revisions: goals downgraded to CGA/min A for functional mobility and BADLs.  OT Short Term Goals Week 1:  OT Short Term Goal 1 (Week 1): Pt will complete sit > stand in prep for ADL with CGA using LRAD OT Short Term Goal 1 - Progress (Week 1): Not met OT Short Term Goal 2 (Week 1): Pt will complete toilet transfer with CGA using LRAD OT  Short Term Goal 2 - Progress (Week 1): Not met OT Short Term Goal 3 (Week 1): Pt will initiate toileting need with min questioning cues OT Short Term Goal 3 - Progress (Week 1): Met OT Short Term Goal 4 (Week 1): Pt will complete 1/3 toileting steps with min A for balance OT Short Term Goal 4 - Progress (Week 1): Met Week 2:  OT Short Term Goal 1 (Week 2): Pt will complete sit > stand in prep for ADL with CGA using LRAD OT Short Term Goal 1 - Progress (Week 2): Not met OT Short Term Goal 2 (Week 2): Pt will complete toilet transfer with CGA using LRAD OT Short Term Goal 2 - Progress (Week 2): Not met OT Short Term Goal 3 (Week 2): Pt will follow 1 step commands to complete LB dressing with min A OT Short Term Goal 3 - Progress (Week 2): Met Week 3:  OT Short Term Goal 1 (Week 3): STG = LTG due to ELOS   Naiya Corral E Kennedie Pardoe, MS, OTR/L  07/22/2023, 12:30 PM

## 2023-07-22 NOTE — Plan of Care (Signed)
  Problem: Consults Goal: RH GENERAL PATIENT EDUCATION Description: See Patient Education module for education specifics. Outcome: Progressing   Problem: RH BOWEL ELIMINATION Goal: RH STG MANAGE BOWEL WITH ASSISTANCE Description: STG Manage Bowel with mod I Assistance. Outcome: Progressing Goal: RH STG MANAGE BOWEL W/MEDICATION W/ASSISTANCE Description: STG Manage Bowel with Medication with mod I Assistance. Outcome: Progressing   Problem: RH SAFETY Goal: RH STG ADHERE TO SAFETY PRECAUTIONS W/ASSISTANCE/DEVICE Description: STG Adhere to Safety Precautions With cues Assistance/Device. Outcome: Progressing   Problem: RH PAIN MANAGEMENT Goal: RH STG PAIN MANAGED AT OR BELOW PT'S PAIN GOAL Description: < 4 with prns Outcome: Progressing   Problem: RH KNOWLEDGE DEFICIT GENERAL Goal: RH STG INCREASE KNOWLEDGE OF SELF CARE AFTER HOSPITALIZATION Description: Patient and family will be able to manage care at discharge using educational resources for medications, dietary modification recommendations independently Outcome: Progressing   Problem: Fluid Volume: Goal: Compliance with measures to maintain balanced fluid volume will improve Description: Patient and family will be able to manage care at discharge using educational resources for 1200 cc fluid restriction/ dietary modification recommendations independently Outcome: Progressing   Problem: Health Behavior/Discharge Planning: Goal: Ability to manage health-related needs will improve Outcome: Progressing   Problem: Nutritional: Goal: Ability to make healthy dietary choices will improve Description: Patient and family will be able to manage care at discharge using educational resources for ESRD/HD dietary modification recommendations independently Outcome: Progressing   Problem: Clinical Measurements: Goal: Complications related to the disease process, condition or treatment will be avoided or minimized Description: Patient and  family will be able to manage care at discharge using educational resources for medications, dietary modification recommendations independently Outcome: Progressing   Problem: Education: Goal: Knowledge of disease and its progression will improve Outcome: Progressing Goal: Individualized Educational Video(s) Outcome: Progressing

## 2023-07-23 MED ORDER — SODIUM CHLORIDE 0.9% FLUSH
10.0000 mL | Freq: Two times a day (BID) | INTRAVENOUS | Status: DC
  Administered 2023-07-23 – 2023-08-01 (×14): 10 mL

## 2023-07-23 MED ORDER — SODIUM CHLORIDE 0.9% FLUSH: 10.0000 mL | INTRAVENOUS | Status: DC | PRN

## 2023-07-23 NOTE — Progress Notes (Signed)
 Physical Therapy Note  Patient Details  Name: [name] MRN: [id number] Date of Birth: [birth year] Today's Date: 07/23/2023    Pt's plan of care adjusted to 15/7 after speaking with care team and discussed with MD in team conference as pt currently unable to tolerate current therapy schedule with OT and PT.      [name] Nena Bank PT, DPT 07/23/2023, 10:11 AM

## 2023-07-23 NOTE — Progress Notes (Signed)
 Physical Therapy Session Note  Patient Details  Name: [name] MRN: [id number] Date of Birth: 1945-04-23  Today's Date: 07/23/2023 PT Individual Time: 0930-1038 PT Individual Time Calculation (min): 68 min   Short Term Goals: Week 1:  PT Short Term Goal 1 (Week 1): pt will transfer bed<>chair with LRAD and CGA PT Short Term Goal 1 - Progress (Week 1): Progressing toward goal PT Short Term Goal 2 (Week 1): pt will transfer sit<>stand with LRAD and CGA PT Short Term Goal 2 - Progress (Week 1): Progressing toward goal PT Short Term Goal 3 (Week 1): pt will ambulate 45ft with LRAD and CGA PT Short Term Goal 3 - Progress (Week 1): Progressing toward goal Week 2:  PT Short Term Goal 1 (Week 2): STG=LTG due to LOS  Skilled Therapeutic Interventions/Progress Updates:   Received pt sitting EOB, pt agreeable to PT treatment, and reported pain 8/10 in R knee. Session with emphasis on functional mobility/transfers, generalized strengthening and endurance, dynamic standing balance/coordination, simulated car transfers, and ambulation. Removed non-skid socks and donned regular socks with supervision and shoes with max A. Stood from low sitting EOB with RW and heavy mod A and ambulated 15ft with RW and CGA with close WC follow - limited by R knee pain. When ambulating, pt flexed over RW with heavy reliance on BUE support with bilateral knees flexed in stance and difficulty achieving knee extension.   Stood from [facility] with RW and min A and ambulated additional 38ft with RW and CGA - still limited by knee pain and declined any further ambulation. Discussed with MD changing therapy schedule to 15/7 to allow adequate rest in between therapy and to avoid further exacerbating knee pain. Pt transported to/from room in [facility] dependently for time management purposes. Transferred on/off Nustep via stand<>pivot with RW and min A and performed seated BUE/BLE strengthening on Nustep at workload 3 for 15 minutes for a total  of 269 steps with emphasis on cardiovascular endurance and knee flexion ROM - pt required assist to get LEs on/off footplates. Pt going so slow that machine transitioned into rest mode. Pt performed WC mobility 156ft using BUE and supervision back to room with emphasis on UE strength. Concluded session with pt sitting in WC, needs within reach, and seatbelt alarm on.   Therapy Documentation Precautions:  Precautions Precautions: Fall Precaution/Restrictions Comments: dementia Restrictions Weight Bearing Restrictions Per Provider Order: No  Therapy/Group: Individual Therapy [name] Nena Bank PT, DPT 07/23/2023, 6:57 AM

## 2023-07-23 NOTE — Progress Notes (Signed)
 Occupational Therapy Session Note  Patient Details  Name: [name] MRN: [id number] Date of Birth: 12-08-1945  Today's Date: 07/23/2023 OT Individual Time: 1050-1200 OT Individual Time Calculation (min): 70 min    Short Term Goals: Week 3:  OT Short Term Goal 1 (Week 3): STG = LTG due to ELOS  Skilled Therapeutic Interventions/Progress Updates:  Skilled OT intervention completed with focus on pain management, cardiovascular/BUE endurance, FMC, cognition. Pt received seated in w/c, agreeable to session. Unrated pain reported in R > L knee; per chart and checking in with nurse, pt already medicated. OT applied voltaren  gel per nursing request to both knees, and applied hot packs to anterior portion of R knee and wrapped with ACE wrap. OT offered modified therapy and rest breaks for pain relief.   Transported dependently in w/c <> gym. Pt completed the following intervals while seated at the UE Nustep utilizing pace partner to maintain RPMs to promote global/BUE endurance needed for independence with BADLs and functional transfers: -3 min, level 1, forwards x 4 trials Intermittent rest breaks needed for fatigue, with pt only able to keep pace about 75% of the time without constant cueing.  Pt verbalized that heat on R knee still was not helping the pain and declined standing activities. Seated at table top, pt completed the following activities to address [facility] and cognitive sequencing needed for functional use of BUE during BADLs: - 9 hole peg test: Rt hand- 34.40 sec (dominant), Lt hand- 43.12 sec -following moderately complex design to assemble foam lego tower; required intermittent min/mod A for cognitive strategy  Back in room, pt remained seated in w/c, with belt alarm on/activated, and with all needs in reach at end of session.  Therapy Documentation Precautions:  Precautions Precautions: Fall Precaution/Restrictions Comments: dementia Restrictions Weight Bearing Restrictions  Per Provider Order: No    Therapy/Group: Individual Therapy  Ruthanna Covert, MS, OTR/L  07/23/2023, 12:09 PM

## 2023-07-23 NOTE — Progress Notes (Signed)
 PROGRESS NOTE   Subjective/Complaints: Continues to have bilateral knee pain Decreased to 15/7 She is able to walk 13 feet with stops due to pain  ROS: +upper and lower extremity weakness- improved, +bowel incontinence, +right knee pain-limits therapy, continues but is betterr. Denies CP, SOB, abd pain, n/v/d/c, +dizziness- improved   Objective:   No results found.    Recent Labs    07/21/23 0726 07/22/23 1044  WBC 13.1* 12.0*  HGB 7.7* 8.0*  HCT 24.9* 26.7*  PLT 419* 425*    Recent Labs    07/22/23 1530  NA 137  K 5.2*  CL 97*  CO2 24  GLUCOSE 162*  BUN 69*  CREATININE 6.81*  CALCIUM  9.7     Intake/Output Summary (Last 24 hours) at 07/23/2023 1513 Last data filed at 07/23/2023 0304 Gross per 24 hour  Intake 186.22 ml  Output 1000 ml  Net -813.78 ml        Physical Exam: Vital Signs Blood pressure (!) 152/59, pulse 86, temperature 98.4 F (36.9 C), resp. rate 15, height (P) 5\' 4"  (1.626 m), weight 73.6 kg, SpO2 98%.  Gen: no distress, normal appearing, resting in bed.  HEENT: oral mucosa pink and moist, NCAT Cardio: Reg rate, no m/r/g appreciated Chest: normal effort, normal rate of breathing, CTAB Abd: soft, non-distended, +BS throughout, nonTTP Ext/MsK: no edema peripherally, LUE with graft site at L elbow without significant erythema.  R knee wrapped in bulky dressing and ACE wrap. L knee with less tenderness and no effusion.  Psychiatric:     Comments: Pt flat, cooperative, but brighter affect this weekend  PRIOR EXAMS:  Musculoskeletal:        General: Swelling and tenderness (left antecubital region) present.  Right knee swollen and tender    Cervical back: Normal range of motion.  Skin:    Comments: AVF site CDI. No erythema. Right heel boggy  Neurological:     Mental Status: She is alert.     Comments: Oriented to self only. Unable to recall age, DOB or place. Poor recall and  decrease insight into deficits. She was able to follow simple motor commands. No CN deficits. Limited RUE d/t HD catheter. LUE limited d/t pain. Grip/wrist at least 3-4/5 bilaterally. BLE 3- to 3/5 HF, KE and 4/5 ADF/PF. Decreased LT from ankles down bilaterally. No abnl resting tone. DTR's tr to 1+, stable 5/6 Functional mobility: modA toileting     Assessment/Plan: 1. Functional deficits which require 3+ hours per day of interdisciplinary therapy in a comprehensive inpatient rehab setting. Physiatrist is providing close team supervision and 24 hour management of active medical problems listed below. Physiatrist and rehab team continue to assess barriers to discharge/monitor patient progress toward functional and medical goals  Care Tool:  Bathing    Body parts bathed by patient: Right arm, Left arm, Chest, Abdomen, Front perineal area, Right upper leg, Left upper leg, Right lower leg, Left lower leg, Face   Body parts bathed by helper: Buttocks     Bathing assist Assist Level: Minimal Assistance - Patient > 75%     Upper Body Dressing/Undressing Upper body dressing   What is the patient wearing?: Pull  over shirt, Bra    Upper body assist Assist Level: Supervision/Verbal cueing    Lower Body Dressing/Undressing Lower body dressing      What is the patient wearing?: Pants     Lower body assist Assist for lower body dressing: Contact Guard/Touching assist     Toileting Toileting    Toileting assist Assist for toileting: Moderate Assistance - Patient 50 - 74%     Transfers Chair/bed transfer  Transfers assist     Chair/bed transfer assist level: Minimal Assistance - Patient > 75%     Locomotion Ambulation   Ambulation assist      Assist level: Minimal Assistance - Patient > 75% Assistive device: Walker-rolling Max distance: 76ft   Walk 10 feet activity   Assist  Walk 10 feet activity did not occur: Safety/medical concerns  Assist level: Minimal  Assistance - Patient > 75% Assistive device: Walker-rolling   Walk 50 feet activity   Assist Walk 50 feet with 2 turns activity did not occur: Safety/medical concerns (pain)         Walk 150 feet activity   Assist Walk 150 feet activity did not occur: Safety/medical concerns (pain)         Walk 10 feet on uneven surface  activity   Assist Walk 10 feet on uneven surfaces activity did not occur: Safety/medical concerns (pain)         Wheelchair     Assist Is the patient using a wheelchair?: Yes Type of Wheelchair: Manual (lightweight)    Wheelchair assist level: Supervision/Verbal cueing Max wheelchair distance: 17ft    Wheelchair 50 feet with 2 turns activity    Assist        Assist Level: Supervision/Verbal cueing   Wheelchair 150 feet activity     Assist      Assist Level: Supervision/Verbal cueing   Blood pressure (!) 152/59, pulse 86, temperature 98.4 F (36.9 C), resp. rate 15, height (P) 5\' 4"  (1.626 m), weight 73.6 kg, SpO2 98%.  Medical Problem List and Plan: 1. Functional deficits secondary to debility after acute respiratory failure and multiple medical issues             -patient may not yet shower             -ELOS/Goals: 10-14 days, supervision goals with PT, OT, SLP  Updated Josephus Nida (daughter)  -continue CIR  Grounds pass ordered  Decrease to 15/7 2.  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 Acute on chronic LBP/Pain Management: Continue Hydrocodone  prn severe pain--monitor for sedation/confusion.              --Was on IV dilaudid  till 04/16-->now on hydrocodone  10 TID on average.              --increased use of Zanaflex  in past 48 hours.              --continue Gabapentin  300 mg/hs 4. Hx of depression/anxiety/Sleep: LCSW to follow for evaluation and support             --melatonin prn for insomnia.               -antipsychotic agents: N/A             -pt on chronic prozac  20mg  daily and remeron  7.5mg  nightly 5. Neuropsych/cognition: This patient is not capable of making decisions on her own behalf.  6. Skin/Wound Care: Routine pressure relief measures.              -float both heels 7. Fluids/Electrolytes/Nutrition: Strict I/O. Labs with HD             --1200 cc FR/day.  8. Cellulitis/Possible abscess LUE: On Van since 04/19 and Zosyn  since 04/17 --WBC trending down to 10.5 today. BC X 2 from 04/19 pending--negative so far.  --has been afebrile X 24 hours.  Zosyn  changed to Augmentin  by Dr. Sunnie England. End date for Vanc/augmentin  is 4/29              -pt reports that arm is feeling less tender -07/14/23 no fevers but now worsening leukocytosis, WBC 19.7 yesterday and 20.4 on repeat today, LUE not really that concerning looking -continue vancomycin /ceftriaxone  -SEE BELOW #21-22  9. H/o GIB: Monitor H/H as had further drop. Hgb down to             On IV iron /ESA  -07/21/23 Hgb stable 7-8 range  10. HTN: Monitor BP TID--on Torsemide  on non dialysis days.  -07/20/23 [name] stable, apparently a little soft during dialysis, may need midodrine  on dialysis days per nephro; monitor -07/21/23 [name] fine, even a little borderline high at times; monitor  Vitals:   07/22/23 1459 07/22/23 1532 07/22/23 1600 07/22/23 1630  BP: (!) 179/66 (!) 151/67 (!) 157/62 138/63   07/22/23 1705 07/22/23 1730 07/22/23 1803 07/22/23 1813  BP: (!) 147/63 (!) 152/59 119/79 (!) 150/59   07/22/23 1912 07/22/23 2100 07/23/23 0534 07/23/23 1418  BP: (!) 160/61 (!) 118/48 (!) 145/58 (!) 152/59     11. 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 H/o Gastritis/esophagitis: Most recent admission last month. Continue Protonix  40mg  BID.  --Has required one unit PRBC 04/16.              --monitor for signs of bleeding.   13. Chronic diarrhea:  continue Colestid  1g TID and Creon  36kU tid- changed to after meals as per daughter's  request, last BM 5/2  14. H/o cognitive/memory deficits: continue Exelon  1.5mg  bid. Namenda  added  15. ESRD: Now HD dependent--TTS at the end of the day to help with tolerance of therapy             --continue renveal for metabolic bone disease.  Per nephro  -07/20/23 dialysis today, then MWF next week to facilitate d/c  17. T2DM: Hgb A1c- 6.2 and diet controlled. Add CM restrictions.  -d/c ISS, d/c CBGs  18. Bilateral knee pain: voltaren  gel scheduled, right knee sleeve ordered, continue  -now s/p b/l aspirations by [name]  19. Bowel incontinence: lumbar MRI showed and discussed there is no evidence of cauda equina  20. New upper extremity weakness: cervical MRI ordered and discussed that there is moderate canal stenosis, discussed with daughter that oral steroids, steroid injection, surgery are possible interventions- deferred at this time given patient's risk factors  21. Right knee effusion: culture reviewed and with 89,000 WBC concerning for septic arthritis, will consult ID, procal elevated, s/p ortho washout, continue ceftriaxone /vanc -per ID, cont vanc/ceftriaxone , await culture results-- tx 4wks from I&D (4/28)   -07/21/23 intra-op cx NGTD x2d; both cx from knee aspirations NGTD x3d  22. Leukocytosis:repeat CBC ordered, procal ordered, consulted ID, continue ceftriaxone /vanc Intra-op cultures reviewed and with NGTD  23. Anemia: Reviewed and Hgb improved to 8  24. Dementia: added namenda  5mg  daily, continue  25. Neuropathic pain: added namenda  5mg  daily, metanx added,  discussed with daughter, continue  LOS: 15 days A FACE TO FACE EVALUATION WAS PERFORMED  Andrzej Scully P Daley Mooradian 07/23/2023, 3:13 PM

## 2023-07-23 NOTE — Progress Notes (Incomplete)
 Inpatient Rehabilitation Discharge Medication Review by a Pharmacist  A complete drug regimen review was completed for this patient to identify any potential clinically significant medication issues.  High Risk Drug Classes Is patient taking? Indication by Medication  Antipsychotic No   Anticoagulant No   Antibiotic Yes, as an intravenous medication Vanco / ceftazidime- septic arthritis EOT 08/16/2023  Opioid Yes OxyIR- acute pain  Antiplatelet No   Hypoglycemics/insulin  No   Vasoactive Medication No   Chemotherapy No   Other Yes Melatonin- sleep Namenda - alzheimer's / dementia Protonix - GERD     Type of Medication Issue Identified Description of Issue Recommendation(s)  Drug Interaction(s) (clinically significant)     Duplicate Therapy     Allergy     No Medication Administration End Date     Incorrect Dose     Additional Drug Therapy Needed     Significant med changes from prior encounter (inform family/care partners about these prior to discharge).    Other       Clinically significant medication issues were identified that warrant physician communication and completion of prescribed/recommended actions by midnight of the next day:  No   Time spent performing this drug regimen review (minutes):  30   Syrah Daughtrey BS, PharmD, BCPS Clinical Pharmacist 07/23/2023 8:47 AM  Contact: [phone] after 3 PM  "Be curious, not judgmental..." -Rumalda Counter

## 2023-07-23 NOTE — Progress Notes (Signed)
 Patient ID: [name], female   DOB: [birth year], 78 y.o.   MRN: [id number] [facility] Progress Note   Assessment/ Plan:   1.  Functional debility/deficits status post acute hypoxic respiratory failure/uremia: Currently admitted to inpatient rehabilitation unit for intensive physical therapy and Occupational Therapy with which she is making progress. 2.  New start ESRD: With recent frequent hospitalizations for volume overload responding to intravenous diuretics but currently at a point where she needed to start dialysis with worsening uremia/volume excess.  Ongoing hemodialysis on MWF schedule this week with plans to discharge home on Thursday 5/8 and begin hemodialysis at TCU starting Friday 5/9. 3. Anemia: With recent EGD that did not show any evidence of bleeding.  On chronic Eliquis  for history of renal vein thrombosis.  On ESA. 4. CKD-MBD: Calcium  and phosphorus levels currently at goal, continue to monitor on renal diet and sevelamer .  PTH at goal. 5. Nutrition: Continue renal diet with fluid restriction and ongoing protein supplementation. 6. Hypertension: Blood pressures currently elevated, continue to monitor with hemodialysis/UF.  Subjective:   Without any acute events overnight, no specific complaints when seen.   Objective:   BP (!) 145/58 (BP Location: Right Arm)   Pulse 83   Temp 99 F (37.2 C) (Oral)   Resp 18   Ht (P) 5\' 4"  (1.626 m)   Wt 73.6 kg   SpO2 98%   BMI (P) 27.85 kg/m   Physical Exam: Gen: Comfortably resting in bed, awakens to voice CVS: Pulse regular rhythm, normal rate, S1 and S2 normal Resp: Clear to auscultation bilaterally, no distinct rales or rhonchi.  Right IJ TDC in place Abd: Soft, flat, nontender, bowel sounds normal Ext: Trace ankle edema.  Left upper arm AV graft excised.  Labs: BMET Recent Labs  Lab 07/16/23 1354 07/18/23 1045 07/20/23 0553 07/22/23 1530  NA 130* 135 134* 137  K 4.0 4.2 4.4 5.2*  CL 92* 92*  92* 97*  CO2 19* 23 22 24   GLUCOSE 153* 106* 143* 162*  BUN 94* 72* 63* 69*  CREATININE 8.11* 6.72* 6.15* 6.81*  CALCIUM  9.5 10.1 9.9 9.7  PHOS 4.9* 4.2  --  3.1   CBC Recent Labs  Lab 07/16/23 1006 07/16/23 1354 07/17/23 1244 07/18/23 1045 07/20/23 0553 07/21/23 0726 07/22/23 1044  WBC 12.6*   < > 12.0* 11.6* 14.2* 13.1* 12.0*  NEUTROABS 10.5*  --  9.2*  --   --   --  9.0*  HGB 8.3*   < > 8.7* 8.2* 8.2* 7.7* 8.0*  HCT 26.6*   < > 27.9* 27.4* 26.6* 24.9* 26.7*  MCV 91.7   < > 90.9 92.6 92.0 92.9 94.0  PLT 474*   < > 476* 520* 464* 419* 425*   < > = values in this interval not displayed.      Medications:     apixaban   5 mg Oral BID   B-complex with vitamin C  1 tablet Oral Daily   Chlorhexidine  Gluconate Cloth  6 each Topical Q12H   cholecalciferol   1,000 Units Oral Daily   colestipol   1 g Oral TID AC   darbepoetin (ARANESP ) injection - DIALYSIS  200 mcg Subcutaneous Q Sat-1800   diclofenac  Sodium  2 g Topical QID   docusate sodium   100 mg Oral BID   FLUoxetine   20 mg Oral Daily   gabapentin   300 mg Oral QHS   kidney failure book   Does not apply Once   l-methylfolate-B6-B12  1  tablet Oral Daily   lipase/protease/amylase  36,000 Units Oral TID AC   memantine   5 mg Oral Daily   mirtazapine   7.5 mg Oral QHS   multivitamin  1 tablet Oral QHS   pantoprazole   40 mg Oral BID   rivastigmine   1.5 mg Oral BID   sevelamer  carbonate  1,600 mg Oral TID WC   torsemide   100 mg Oral Once per day on Sunday Tuesday Thursday Saturday   Clevester Dally, MD 07/23/2023, 7:45 AM

## 2023-07-23 NOTE — Progress Notes (Addendum)
 Updated Hep B labs and recent progress notes provided to [facility]. Plan is for pt to receive iv abx with HD at d/c. Spoke to TCU RN yesterday to make her aware and renal NP provided orders to clinic yesterday. HD arrangements added to AVS as well. Will assist as needed.   Lauraine Polite Renal Navigator [phone]  Addendum at 12:45 pm: Message left for pt's daughter, Josephus Nida, to discuss some out-pt HD info. Awaiting a return call from daughter.

## 2023-07-23 NOTE — Progress Notes (Signed)
 Occupational Therapy Session Note  Patient Details  Name: [name] MRN: [id number] Date of Birth: 1945/10/14  Today's Date: 07/23/2023 OT Individual Time: [phone] OT Individual Time Calculation (min): 27 min    Short Term Goals: Week 3:  OT Short Term Goal 1 (Week 3): STG = LTG due to ELOS  Skilled Therapeutic Interventions/Progress Updates:      Therapy Documentation Precautions:  Precautions Precautions: Fall Precaution/Restrictions Comments: dementia Restrictions Weight Bearing Restrictions Per Provider Order: No General: "Hi there" Pt supine in bed upon OT arrival, agreeable to OT session.  Pain:  8/10 pain reported in Rt knee, positioning, activity, intermittent rest breaks, distractions provided for pain management, pt reports tolerable to proceed.   ADL: OT providing skilled intervention on ADL retraining in order to increase independence with tasks and increase activity tolerance. Pt completed the following tasks at the current level of assist: Bed mobility: SBA with increased time for supine><EOB Grooming/oral hygiene: set up for washing face at bed level UB dressing: SBA for donning/doffing overhead shirt LB dressing: Min A for lateral leans to manage over waist for leggings, OT attempting to have pt stand to manage over waist, pt unable to get into full stand d/t pain in knee   Pt supine in bed with bed alarm activated, 2 bed rails up, call light within reach and 4Ps assessed.   Therapy/Group: Individual Therapy  Nila Barth, OTD, OTR/L 07/23/2023, 12:55 PM

## 2023-07-23 NOTE — Plan of Care (Signed)
  Problem: Consults Goal: RH GENERAL PATIENT EDUCATION Description: See Patient Education module for education specifics. Outcome: Progressing   Problem: RH BOWEL ELIMINATION Goal: RH STG MANAGE BOWEL WITH ASSISTANCE Description: STG Manage Bowel with mod I Assistance. Outcome: Progressing Goal: RH STG MANAGE BOWEL W/MEDICATION W/ASSISTANCE Description: STG Manage Bowel with Medication with mod I Assistance. Outcome: Progressing   Problem: RH SAFETY Goal: RH STG ADHERE TO SAFETY PRECAUTIONS W/ASSISTANCE/DEVICE Description: STG Adhere to Safety Precautions With cues Assistance/Device. Outcome: Progressing   Problem: RH PAIN MANAGEMENT Goal: RH STG PAIN MANAGED AT OR BELOW PT'S PAIN GOAL Description: < 4 with prns Outcome: Progressing   Problem: Nutritional: Goal: Ability to make healthy dietary choices will improve Description: Patient and family will be able to manage care at discharge using educational resources for ESRD/HD dietary modification recommendations independently Outcome: Progressing

## 2023-07-24 ENCOUNTER — Ambulatory Visit

## 2023-07-24 ENCOUNTER — Inpatient Hospital Stay (HOSPITAL_COMMUNITY)

## 2023-07-24 LAB — RENAL FUNCTION PANEL
Albumin: 2.2 g/dL — ABNORMAL LOW (ref 3.5–5.0)
Anion gap: 13 (ref 5–15)
BUN: 62 mg/dL — ABNORMAL HIGH (ref 8–23)
CO2: 25 mmol/L (ref 22–32)
Calcium: 9.7 mg/dL (ref 8.9–10.3)
Chloride: 98 mmol/L (ref 98–111)
Creatinine, Ser: 6.46 mg/dL — ABNORMAL HIGH (ref 0.44–1.00)
GFR, Estimated: 6 mL/min — ABNORMAL LOW (ref >60)
Glucose, Bld: 164 mg/dL — ABNORMAL HIGH (ref 70–99)
Phosphorus: 3.8 mg/dL (ref 2.5–4.6)
Potassium: 4.8 mmol/L (ref 3.5–5.1)
Sodium: 136 mmol/L (ref 135–145)

## 2023-07-24 LAB — AEROBIC/ANAEROBIC CULTURE W GRAM STAIN (SURGICAL/DEEP WOUND)
Gram Stain: NONE SEEN
Gram Stain: NONE SEEN

## 2023-07-24 LAB — CBC
HCT: 24.6 % — ABNORMAL LOW (ref 36.0–46.0)
Hemoglobin: 7.3 g/dL — ABNORMAL LOW (ref 12.0–15.0)
MCH: 28.4 pg (ref 26.0–34.0)
MCHC: 29.7 g/dL — ABNORMAL LOW (ref 30.0–36.0)
MCV: 95.7 fL (ref 80.0–100.0)
Platelets: 394 10*3/uL (ref 150–400)
RBC: 2.57 MIL/uL — ABNORMAL LOW (ref 3.87–5.11)
RDW: 17.3 % — ABNORMAL HIGH (ref 11.5–15.5)
WBC: 12.3 10*3/uL — ABNORMAL HIGH (ref 4.0–10.5)
nRBC: 0.2 % (ref 0.0–0.2)

## 2023-07-24 LAB — VANCOMYCIN, RANDOM: Vancomycin Rm: 30 ug/mL

## 2023-07-24 MED ORDER — VANCOMYCIN HCL 500 MG/100ML IV SOLN: 500.0000 mg | INTRAVENOUS | Status: DC

## 2023-07-24 MED ORDER — APIXABAN 2.5 MG PO TABS
2.5000 mg | ORAL_TABLET | Freq: Two times a day (BID) | ORAL | Status: DC
  Administered 2023-07-24 – 2023-08-01 (×15): 2.5 mg via ORAL
  Filled 2023-07-24 (×15): qty 1

## 2023-07-24 MED ORDER — HEPARIN SODIUM (PORCINE) 1000 UNIT/ML IJ SOLN
INTRAMUSCULAR | Status: AC
  Filled 2023-07-24: qty 3

## 2023-07-24 MED ORDER — HEPARIN SODIUM (PORCINE) 1000 UNIT/ML IJ SOLN
1.6000 mL | Freq: Once | INTRAMUSCULAR | Status: AC
  Administered 2023-07-24: 1.6 mL via INTRAVENOUS
  Filled 2023-07-24: qty 1.6

## 2023-07-24 MED ORDER — VANCOMYCIN HCL 750 MG/150ML IV SOLN
750.0000 mg | INTRAVENOUS | Status: DC
  Administered 2023-07-26 – 2023-08-01 (×3): 750 mg via INTRAVENOUS
  Filled 2023-07-24 (×4): qty 150

## 2023-07-24 NOTE — Progress Notes (Signed)
 Patient ID: Cymone [place] Lipford, female   DOB: Sep 13, 1945, 78 y.o.   MRN: [id number]  SW spoke with pt dtr Josephus Nida to provide updates from team conference and extension offered though Saturday due to medical concerns. Reports that she is concerned that her mother is not ready to return home,and not all has been done to accommodate, and continued concerns about not having a ramp because HOA process. Discussion in length on limited gains her mother has had while in rehab- she understands limited progress her mother has made in rehab prior to washout and after. Discussed short term SNF rehab and SNF placement process, and a more informed decision can be made after family edu. She will discuss with her family about short term SNF rehab,and they will make a decision tomorrow. SW will send out SNF referral in case placement is the plan.   Norval Been, MSW, LCSW Office: [phone] Cell: [phone] Fax: [phone]

## 2023-07-24 NOTE — Plan of Care (Signed)
  Problem: RH BOWEL ELIMINATION Goal: RH STG MANAGE BOWEL WITH ASSISTANCE Description: STG Manage Bowel with  mod I Assistance. Outcome: Progressing Goal: RH STG MANAGE BOWEL W/MEDICATION W/ASSISTANCE Description: STG Manage Bowel with Medication with  mod I Assistance. Outcome: Progressing   Problem: RH SAFETY Goal: RH STG ADHERE TO SAFETY PRECAUTIONS W/ASSISTANCE/DEVICE Description: STG Adhere to Safety Precautions With cues Assistance/Device. Outcome: Progressing   Problem: RH PAIN MANAGEMENT Goal: RH STG PAIN MANAGED AT OR BELOW PT'S PAIN GOAL Description: < 4 with prns Outcome: Progressing

## 2023-07-24 NOTE — Procedures (Signed)
 Patient seen on Hemodialysis. BP (!) 154/72 (BP Location: Right Arm)   Pulse 74   Temp 98 F (36.7 C)   Resp 17   Ht (P) 5\' 4"  (1.626 m)   Wt 73.5 kg   SpO2 99%   BMI (P) 27.81 kg/m   QB 300, UF goal 1L Tolerating treatment without complaints at this time. Plans for likely discharge home tomorrow to start HD at the TCU at [facility] on Friday 07/27/2023.    Clevester Dally MD [facility]. Office # [phone] Pager # [phone] 8:37 AM

## 2023-07-24 NOTE — Progress Notes (Signed)
 Last bm per flowsheet 07/21/23 awaiting for patient to have bm to collect stool sample for occult ordered.   Randeen Busman, LPN

## 2023-07-24 NOTE — Progress Notes (Signed)
 PROGRESS NOTE   Subjective/Complaints: HD this morning Patient's chart reviewed- No issues reported overnight High SBP and low DBP  ROS: +upper and lower extremity weakness- improved, +bowel incontinence, +right knee pain-limits therapy, continues but is betterr. Denies CP, SOB, abd pain, n/v/d/c, +dizziness- improved   Objective:   No results found.    Recent Labs    07/22/23 1044 07/24/23 0827  WBC 12.0* 12.3*  HGB 8.0* 7.3*  HCT 26.7* 24.6*  PLT 425* 394    Recent Labs    07/22/23 1530 07/24/23 0827  NA 137 136  K 5.2* 4.8  CL 97* 98  CO2 24 25  GLUCOSE 162* 164*  BUN 69* 62*  CREATININE 6.81* 6.46*  CALCIUM  9.7 9.7     Intake/Output Summary (Last 24 hours) at 07/24/2023 1122 Last data filed at 07/24/2023 0733 Gross per 24 hour  Intake 356 ml  Output --  Net 356 ml        Physical Exam: Vital Signs Blood pressure (!) 165/53, pulse 81, temperature 98.1 F (36.7 C), resp. rate 16, height (P) 5\' 4"  (1.626 m), weight 73.5 kg, SpO2 100%.  Gen: no distress, normal appearing, resting in bed.  HEENT: oral mucosa pink and moist, NCAT Cardio: Reg rate, no m/r/g appreciated Chest: normal effort, normal rate of breathing, CTAB Abd: soft, non-distended, +BS throughout, nonTTP Ext/MsK: no edema peripherally, LUE with graft site at L elbow without significant erythema.  R knee wrapped in bulky dressing and ACE wrap. L knee with less tenderness and no effusion.  Psychiatric:     Comments: Pt flat, cooperative, but brighter affect this weekend Musculoskeletal:        General: Swelling and tenderness (left antecubital region) present.  Right knee swollen and tender    Cervical back: Normal range of motion.  Skin:    Comments: AVF site CDI. No erythema. Right heel boggy  Neurological:     Mental Status: She is alert.     Comments: Oriented to self only. Unable to recall age, DOB or place. Poor recall and  decrease insight into deficits. She was able to follow simple motor commands. No CN deficits. Limited RUE d/t HD catheter. LUE limited d/t pain. Grip/wrist at least 3-4/5 bilaterally. BLE 3- to 3/5 HF, KE and 4/5 ADF/PF. Decreased LT from ankles down bilaterally. No abnl resting tone. DTR's tr to 1+, stable 5/7 Functional mobility: modA toileting     Assessment/Plan: 1. Functional deficits which require 3+ hours per day of interdisciplinary therapy in a comprehensive inpatient rehab setting. Physiatrist is providing close team supervision and 24 hour management of active medical problems listed below. Physiatrist and rehab team continue to assess barriers to discharge/monitor patient progress toward functional and medical goals  Care Tool:  Bathing    Body parts bathed by patient: Right arm, Left arm, Chest, Abdomen, Front perineal area, Right upper leg, Left upper leg, Right lower leg, Left lower leg, Face   Body parts bathed by helper: Buttocks     Bathing assist Assist Level: Minimal Assistance - Patient > 75%     Upper Body Dressing/Undressing Upper body dressing   What is the patient wearing?: Pull over  shirt, Bra    Upper body assist Assist Level: Supervision/Verbal cueing    Lower Body Dressing/Undressing Lower body dressing      What is the patient wearing?: Pants     Lower body assist Assist for lower body dressing: Contact Guard/Touching assist     Toileting Toileting    Toileting assist Assist for toileting: Moderate Assistance - Patient 50 - 74%     Transfers Chair/bed transfer  Transfers assist     Chair/bed transfer assist level: Minimal Assistance - Patient > 75%     Locomotion Ambulation   Ambulation assist      Assist level: Minimal Assistance - Patient > 75% Assistive device: Walker-rolling Max distance: 74ft   Walk 10 feet activity   Assist  Walk 10 feet activity did not occur: Safety/medical concerns  Assist level: Minimal  Assistance - Patient > 75% Assistive device: Walker-rolling   Walk 50 feet activity   Assist Walk 50 feet with 2 turns activity did not occur: Safety/medical concerns (pain)         Walk 150 feet activity   Assist Walk 150 feet activity did not occur: Safety/medical concerns (pain)         Walk 10 feet on uneven surface  activity   Assist Walk 10 feet on uneven surfaces activity did not occur: Safety/medical concerns (pain)         Wheelchair     Assist Is the patient using a wheelchair?: Yes Type of Wheelchair: Manual (lightweight)    Wheelchair assist level: Supervision/Verbal cueing Max wheelchair distance: 133ft    Wheelchair 50 feet with 2 turns activity    Assist        Assist Level: Supervision/Verbal cueing   Wheelchair 150 feet activity     Assist      Assist Level: Supervision/Verbal cueing   Blood pressure (!) 165/53, pulse 81, temperature 98.1 F (36.7 C), resp. rate 16, height (P) 5\' 4"  (1.626 m), weight 73.5 kg, SpO2 100%.  Medical Problem List and Plan: 1. Functional deficits secondary to debility after acute respiratory failure and multiple medical issues             -patient may not yet shower             -ELOS/Goals: 10-14 days, supervision goals with PT, OT, SLP  Updated Josephus Nida (daughter)  -continue CIR  Grounds pass ordered  Decrease to 15/7 2.  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 Acute on chronic LBP/Pain Management: Continue Hydrocodone  prn severe pain--monitor for sedation/confusion.              --Was on IV dilaudid  till 04/16-->now on hydrocodone  10 TID on average.              --increased use of Zanaflex  in past 48 hours.              --continue Gabapentin  300 mg/hs 4. Hx of depression/anxiety/Sleep: LCSW to follow for evaluation and support             --melatonin prn for insomnia.               -antipsychotic agents: N/A             -pt on chronic prozac  20mg  daily and remeron  7.5mg  nightly 5. Neuropsych/cognition: This patient is not capable of making decisions on her own behalf. 6.  Skin/Wound Care: Routine pressure relief measures.              -float both heels 7. Fluids/Electrolytes/Nutrition: Strict I/O. Labs with HD             --1200 cc FR/day.  8. Cellulitis/Possible abscess LUE: On Van since 04/19 and Zosyn  since 04/17 --WBC trending down to 10.5 today. BC X 2 from 04/19 pending--negative so far.  --has been afebrile X 24 hours.  Zosyn  changed to Augmentin  by Dr. Sunnie England. End date for Vanc/augmentin  is 4/29              -pt reports that arm is feeling less tender -07/14/23 no fevers but now worsening leukocytosis, WBC 19.7 yesterday and 20.4 on repeat today, LUE not really that concerning looking -continue vancomycin /ceftriaxone  -SEE BELOW #21-22  9. H/o GIB: Monitor H/H as had further drop. Hgb down to             On IV iron /ESA  -07/21/23 Hgb stable 7-8 range  10. HTN: Monitor BP TID--on Torsemide  on non dialysis days.  -07/20/23 [name] stable, apparently a little soft during dialysis, may need midodrine  on dialysis days per nephro; monitor -07/21/23 [name] fine, even a little borderline high at times; monitor  Vitals:   07/22/23 2100 07/23/23 0534 07/23/23 1418 07/23/23 1901  BP: (!) 118/48 (!) 145/58 (!) 152/59 (!) 137/51   07/24/23 0609 07/24/23 0823 07/24/23 0830 07/24/23 0900  BP: (!) 154/72 (!) 131/58 (!) 133/56 (!) 149/72   07/24/23 0930 07/24/23 1000 07/24/23 1030 07/24/23 1100  BP: (!) 147/57 (!) 151/67 (!) 145/56 (!) 165/53     11. 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 H/o Gastritis/esophagitis: Most recent admission last month. Continue Protonix  40mg  BID.  --Has required one unit PRBC 04/16.              --monitor for signs of bleeding.   13. Chronic diarrhea:  continue Colestid  1g TID and Creon  36kU tid- changed to after meals as per  daughter's request, last BM 5/2  14. H/o cognitive/memory deficits: continue Exelon  1.5mg  bid. Namenda  added  15. ESRD: Now HD dependent--TTS at the end of the day to help with tolerance of therapy             --continue renveal for metabolic bone disease.  Per nephro  -07/20/23 dialysis today, then MWF next week to facilitate d/c  17. T2DM: Hgb A1c- 6.2 and diet controlled. Add CM restrictions.  -d/c ISS, d/c CBGs  18. Bilateral knee pain: voltaren  gel scheduled, right knee sleeve ordered, continue  -now s/p b/l aspirations by [name]  19. Bowel incontinence: lumbar MRI showed and discussed there is no evidence of cauda equina  20. New upper extremity weakness: cervical MRI ordered and discussed that there is moderate canal stenosis, discussed with daughter that oral steroids, steroid injection, surgery are possible interventions- deferred at this time given patient's risk factors  21. Right knee effusion: culture reviewed and with 89,000 WBC concerning for septic arthritis, will consult ID, procal elevated, s/p ortho washout, continue ceftriaxone /vanc -per ID, continue vanc, Fortaz await culture results-- tx 4wks from I&D (4/28)   -07/21/23 intra-op cx NGTD x2d; both cx from knee aspirations NGTD x3d  22. Leukocytosis:repeat CBC ordered, procal ordered, consulted ID, continue ceftriaxone /vanc Intra-op cultures reviewed and with NGTD  23. Anemia: Hgb reviewed and decreased to 7,3, repeat tomorrow, stool occult reviewed and is positive, repeat today, consult GI  24. Dementia: added  namenda  5mg  daily, continue  25. Neuropathic pain: added namenda  5mg  daily, metanx added, discussed with daughter, continue  LOS: 16 days A FACE TO FACE EVALUATION WAS PERFORMED  Edoardo Laforte P Terrina Docter 07/24/2023, 11:22 AM

## 2023-07-24 NOTE — Progress Notes (Signed)
 Inpatient Rehabilitation Discharge Medication Review by a Pharmacist  A complete drug regimen review was completed for this patient to identify any potential clinically significant medication issues.  High Risk Drug Classes Is patient taking? Indication by Medication  Antipsychotic No   Anticoagulant No Apixaban - renal artery thrombosis  Antibiotic Yes, as an intravenous medication Vancomycin  / ceftazidime- septic arthritis EOT 08/16/2023  Opioid Yes Norco- acute pain OxyIR- acute pain  Antiplatelet No   Hypoglycemics/insulin  No   Vasoactive Medication No   Chemotherapy No   Other Yes Melatonin- sleep Memantine - alzheimer's / dementia Protonix - GERD Fluoxetine - MDD Mirtazapine - MDD Colestipol - chronic diarrhea Pepcid - GERD Protonix - GERD Renvela - hyperphosphatemia 2/2 ESRD Aranesp - anemia 2/2 ESRD Gabapentin - neuropathic pain Tizanidine - muscle spasms     Type of Medication Issue Identified Description of Issue Recommendation(s)  Drug Interaction(s) (clinically significant)     Duplicate Therapy     Allergy     No Medication Administration End Date     Incorrect Dose     Additional Drug Therapy Needed     Significant med changes from prior encounter (inform family/care partners about these prior to discharge).    Other       Clinically significant medication issues were identified that warrant physician communication and completion of prescribed/recommended actions by midnight of the next day:  No   Time spent performing this drug regimen review (minutes):  30   Thank you for allowing pharmacy to be a part of this patient's care.   Marleta Simmer BS, PharmD, BCPS Clinical Pharmacist 07/24/2023 1:47 PM  Contact: [phone] after 3 PM  "Be curious, not judgmental..." -Rumalda Counter

## 2023-07-24 NOTE — Progress Notes (Signed)
 Patient still awaiting CT notified oncoming nurse of MD order.   Maeryn Mcgath,LPN

## 2023-07-24 NOTE — Progress Notes (Signed)
 Physical Therapy Session Note  Patient Details  Name: [name] MRN: [id number] Date of Birth: January 21, 1946  Today's Date: 07/24/2023 PT Individual Time: 0731-0755 PT Individual Time Calculation (min): 24 min  Today's Date: 07/24/2023 PT Missed Time: 36 Minutes Missed Time Reason: Unavailable (Comment) (dialysis)  Short Term Goals: Week 1:  PT Short Term Goal 1 (Week 1): pt will transfer bed<>chair with LRAD and CGA PT Short Term Goal 1 - Progress (Week 1): Progressing toward goal PT Short Term Goal 2 (Week 1): pt will transfer sit<>stand with LRAD and CGA PT Short Term Goal 2 - Progress (Week 1): Progressing toward goal PT Short Term Goal 3 (Week 1): pt will ambulate 33ft with LRAD and CGA PT Short Term Goal 3 - Progress (Week 1): Progressing toward goal Week 2:  PT Short Term Goal 1 (Week 2): STG=LTG due to LOS  Skilled Therapeutic Interventions/Progress Updates:   Received pt sitting on commode with NT at bedside - PT took over with care. Pt continent of bowel and pt transferred toilet<>WC via stand<>pivot with grab bar and min A and required mod A for clothing management. Sat in WC at sink and performed hand hygiene and applied lotions with set up assist. Began donning pants, then transport arrived to take pt to dialysis. Transferred WC<>bed via stand<>pivot with RW and min A and sit<>supine with supervision and increased time. Pt able to scoot to [facility] pulling on bedrails and use of Trendelenburg bed position with cues for sequencing. Pt left semi-reclined in bed, needs within reach, and left in care of transport services. 36 minutes missed of skilled physical therapy due to dialysis.   Therapy Documentation Precautions:  Precautions Precautions: Fall Precaution/Restrictions Comments: dementia Restrictions Weight Bearing Restrictions Per Provider Order: No  Therapy/Group: Individual Therapy [name] Nena Bank PT, DPT 07/24/2023, 6:53 AM

## 2023-07-24 NOTE — Progress Notes (Signed)
 Occupational Therapy Session Note  Patient Details  Name: Peace [place] Xiao MRN: [id number] Date of Birth: [birth year]  Today's Date: 07/24/2023 OT Individual Time:  - OT missed 60 minutes due to HD   OT missed 60 minutes due to HD unscheduled. Will make up minutes as OT schedule allows.    Merrill Abide 07/24/2023, 10:26 AM

## 2023-07-24 NOTE — Progress Notes (Signed)
 Patient off the unit for dialysis.   Katie Busman, LPN

## 2023-07-24 NOTE — Progress Notes (Addendum)
 Messages left for 2 of pt's daughters requesting a return call to discuss HD info. Spoke to one of pt's granddaughters who is going to reach out to pt's daughters to request that one of them return call to navigator. Awaiting return call from one of pt's daughters.   Lauraine Polite Renal Navigator [phone]  Addendum at 11:30 am: Spoke to pt's daughter, Josephus Nida, via phone. Advised daughter of opening at [facility] on MWF 11:45am chair time. Advised daughter pt can start at [facility] as planned or this option is available. Daughter prefers [facility] MWF. Daughter advised pt can start at [facility] on Friday (arrive at 11:00 am) and that arrangements will placed on pt's AVS. Arrangements text to daughter as well per her request. Contacted [facility] staff at [employer] and [facility] to be advised of daughter's preference for [facility]. Update provided to nephrologist, renal NP, and rehab CSW as well. Contacted [facility] admissions to request that pt's referral be diverted to [facility]. Will assist as needed.

## 2023-07-24 NOTE — Patient Care Conference (Signed)
 Inpatient RehabilitationTeam Conference and Plan of Care Update Date: 07/24/2023   Time: 11:33 AM    Patient Name: [name]      Medical Record Number: [id number]  Date of Birth: November 08, 1945 Sex: Female         Room/Bed: 4W13C/4W13C-01 Payor Info: Payor: HU[employer] MEDICARE / Plan: HU[employer] MEDICARE HMO / Product Type: *No Product type* /    Admit Date/Time:  07/08/2023  2:01 PM  Primary Diagnosis:  Debility  Hospital Problems: Principal Problem:   Debility Active Problems:   Septic arthritis [facility])   Pseudogout    Expected Discharge Date: Expected Discharge Date: 07/27/23  Team Members Present: Physician leading conference: Dr. Laverle Postin Social Worker Present: Norval Been, LCSW Nurse Present: Forrestine Ike, RN PT Present: Nena Bank, PT OT Present: Tim Fontana, OT PPS Coordinator present : Jestine Moron, SLP     Current Status/Progress Goal Weekly Team Focus  Bowel/Bladder   Pt is continent of bowel and Oliguric of bladder   Pt will remain continent of bowel   Will assess qshift and PRN    Swallow/Nutrition/ Hydration               ADL's   Min A UB and LB, mod A toileting, however is greater assist if pain level is high   Goals downgraded to min A   Barriers- pain, cognitive deficits (awareness, problem solving, memory), dynamic standing balance, GM and FM coordination, lack of progress    Mobility   bed mobility supervision, transfers with RW min/mod A, gait 22ft with RW CGA, 2 6in steps with 2 handrails heavy mod/max A   supervision  barriers: knee pain, dementia, global weakness/deconditioning, cognitive impairments, decreased standing tolerance and balance. Need family education and need family to decide if they want custom lightweight WC    Communication                Safety/Cognition/ Behavioral Observations               Pain   Pt currently denies pain   Pt will continue to deny pain   Will assess qshift and PRN     Skin   Pt's incision to right knee is intact   Pt's skin will remain intact  Will assess qshift and PRN      Discharge Planning:  D/c to home with DIL. PRN support from other family. Fam edu on Thursday 1pm-4pm. SW will confirm there are no barriers to discharge.   Team Discussion: Patient with debility limited by gross and fine motor deficits, dementia and pain;appears to have plateaued. Note current Hgb drop; guiac stools and GI consulted for ? Endoscopy.  Patient on target to meet rehab goals: Currently needs supervision - min assist for bed mobility. Able to complete sit- stand with min - mod assist; mod assist from a lower surface.  Able to manage (2) 6" steps with mod - max assist and ambulate up to 4' with CGA however averages 5' before needing a rest.  Needs mod assist for toileting.  Needs max assist for cognitive deficits; working on simple problem solving. Goals for discharge downgraded to min assist overall.  *See Care Plan and progress notes for long and short-term goals.   Revisions to Treatment Plan:  GI consult Neuro psych referral    Teaching Needs: Safety,  medications, transfers, toileting,etc.   Current Barriers to Discharge: Decreased caregiver support, Home enviroment access/layout, Incontinence, and Hemodialysis  Possible Resolutions to Barriers: Family education  Ramp for entry to home Custom wheelchair consult DME: [facility] follow up services     Medical Summary Current Status: anemia, pain  Barriers to Discharge: Medical stability  Barriers to Discharge Comments: anemia. pain Possible Resolutions to [employer] Focus: Hgb reviewed and is decreased, stool occult reviewed and is positve, repeat Hgb tomorrow, continue oxycodone /namenda /knee sleeve   Continued Need for Acute Rehabilitation Level of Care: The patient requires daily medical management by a physician with specialized training in physical medicine and rehabilitation for the following  reasons: Direction of a multidisciplinary physical rehabilitation program to maximize functional independence : Yes Medical management of patient stability for increased activity during participation in an intensive rehabilitation regime.: Yes Analysis of laboratory values and/or radiology reports with any subsequent need for medication adjustment and/or medical intervention. : Yes   I attest that I was present, lead the team conference, and concur with the assessment and plan of the team.   Forrestine Ike B 07/24/2023, 3:53 PM

## 2023-07-24 NOTE — Progress Notes (Signed)
   07/24/23 1139  Vitals  Temp 97.7 F (36.5 C)  Pulse Rate 87  Resp 16  BP (!) 147/69  SpO2 100 %  O2 Device Room Air  Weight 72.5 kg  Type of Weight Post-Dialysis  Oxygen Therapy  Patient Activity (if Appropriate) In bed  Pulse Oximetry Type Continuous  Oximetry Probe Site Changed No  Post Treatment  Dialyzer Clearance Clear  Hemodialysis Intake (mL) 0 mL  Liters Processed 54  Fluid Removed (mL) 1000 mL  Tolerated HD Treatment Yes   Received patient in bed to unit.  Alert and oriented.  Informed consent signed and in chart.   TX duration: Three hours  Patient tolerated well.  Transported back to the room  Alert, without acute distress.  Hand-off given to patient's nurse.   Access used: Right chest HD catheter Access issues: None

## 2023-07-24 NOTE — Progress Notes (Signed)
 Occupational Therapy Session Note  Patient Details  Name: [name] MRN: [id number] Date of Birth: 09/15/1945  Today's Date: 07/24/2023 OT Missed Time: 30 Minutes Missed Time Reason: Unavailable (comment) (HD)  Attempted to see pt for scheduled therapy, however pt was off the unit for unplanned HD. Will attempt to make up missed time as able.   Therapy/Group: Individual Therapy  Ruthanna Covert, MS, OTR/L  07/24/2023, 12:25 PM

## 2023-07-24 NOTE — Plan of Care (Signed)
  Problem: RH PAIN MANAGEMENT Goal: RH STG PAIN MANAGED AT OR BELOW PT'S PAIN GOAL Description: < 4 with prns Outcome: Progressing

## 2023-07-24 NOTE — Progress Notes (Signed)
 Discussed episode of heme positive stool and issues with Hgb in 7-8 range with Dr. Lavaughn Portland. He will order CT A/P--she had colonoscopy and EGD in March and now on HD. Also questioned if Eliquis  dose could be resumed or transition to ASA.

## 2023-07-25 DIAGNOSIS — T82898S Other specified complication of vascular prosthetic devices, implants and grafts, sequela: Secondary | ICD-10-CM

## 2023-07-25 DIAGNOSIS — M25469 Effusion, unspecified knee: Secondary | ICD-10-CM | POA: Insufficient documentation

## 2023-07-25 DIAGNOSIS — N186 End stage renal disease: Secondary | ICD-10-CM

## 2023-07-25 LAB — CBC WITH DIFFERENTIAL/PLATELET
Abs Immature Granulocytes: 0.15 10*3/uL — ABNORMAL HIGH (ref 0.00–0.07)
Basophils Absolute: 0 10*3/uL (ref 0.0–0.1)
Basophils Relative: 0 %
Eosinophils Absolute: 0.1 10*3/uL (ref 0.0–0.5)
Eosinophils Relative: 1 %
HCT: 24.4 % — ABNORMAL LOW (ref 36.0–46.0)
Hemoglobin: 7.3 g/dL — ABNORMAL LOW (ref 12.0–15.0)
Immature Granulocytes: 1 %
Lymphocytes Relative: 11 %
Lymphs Abs: 1.2 10*3/uL (ref 0.7–4.0)
MCH: 28.2 pg (ref 26.0–34.0)
MCHC: 29.9 g/dL — ABNORMAL LOW (ref 30.0–36.0)
MCV: 94.2 fL (ref 80.0–100.0)
Monocytes Absolute: 0.7 10*3/uL (ref 0.1–1.0)
Monocytes Relative: 6 %
Neutro Abs: 8.8 10*3/uL — ABNORMAL HIGH (ref 1.7–7.7)
Neutrophils Relative %: 81 %
Platelets: 370 10*3/uL (ref 150–400)
RBC: 2.59 MIL/uL — ABNORMAL LOW (ref 3.87–5.11)
RDW: 17.1 % — ABNORMAL HIGH (ref 11.5–15.5)
WBC: 11 10*3/uL — ABNORMAL HIGH (ref 4.0–10.5)
nRBC: 0.3 % — ABNORMAL HIGH (ref 0.0–0.2)

## 2023-07-25 MED ORDER — SENNOSIDES-DOCUSATE SODIUM 8.6-50 MG PO TABS: 2.0000 | ORAL_TABLET | Freq: Every day | ORAL | Status: DC

## 2023-07-25 MED ORDER — SENNA 8.6 MG PO TABS: 2.0000 | ORAL_TABLET | Freq: Every day | ORAL | Status: DC

## 2023-07-25 MED ORDER — SENNA 8.6 MG PO TABS
2.0000 | ORAL_TABLET | Freq: Every day | ORAL | Status: DC
  Administered 2023-07-26 – 2023-08-01 (×7): 17.2 mg via ORAL
  Filled 2023-07-25 (×7): qty 2

## 2023-07-25 MED ORDER — SORBITOL 70 % SOLN
60.0000 mL | Freq: Once | Status: AC
  Administered 2023-07-25: 60 mL via ORAL
  Filled 2023-07-25: qty 60

## 2023-07-25 MED ORDER — DICLOFENAC EPOLAMINE 1.3 % EX PTCH
1.0000 | MEDICATED_PATCH | Freq: Two times a day (BID) | CUTANEOUS | Status: DC
  Administered 2023-07-25 – 2023-08-01 (×13): 1 via TRANSDERMAL
  Filled 2023-07-25 (×14): qty 1

## 2023-07-25 MED ORDER — DICLOFENAC SODIUM 1 % EX GEL
2.0000 g | Freq: Three times a day (TID) | CUTANEOUS | Status: DC | PRN
  Administered 2023-07-25: 2 g via TOPICAL

## 2023-07-25 NOTE — NC FL2 (Signed)
 [place]  MEDICAID FL2 LEVEL OF CARE FORM     IDENTIFICATION  Patient Name: [name] Birthdate: May 24, 1945 Sex: female Admission Date (Current Location): 07/08/2023  [facility] and [place] Number:  Producer, television/film/video and Address:  [facility] [place], [address], [place], [place] [address]      Provider Number: [id number]  Attending Physician Name and Address:  Liam Redhead, MD  Relative Name and Phone Number:  Arthurine Billings (daughter) #[phone]    Current Level of Care: Hospital Recommended Level of Care: Skilled Nursing Facility Prior Approval Number:    Date Approved/Denied:   PASRR Number:    Discharge Plan: SNF    Current Diagnoses: Patient Active Problem List   Diagnosis Date Noted   Septic arthritis (HCC) 07/18/2023   Pseudogout 07/18/2023   Gout flare 06/22/2023   Chronic kidney disease (CKD), stage V (HCC) 06/15/2023   Volume overload 06/15/2023   Mitral regurgitation 09/22/2022   Tricuspid regurgitation 09/22/2022   Acute metabolic encephalopathy 07/28/2022   Acute on chronic anemia 02/18/2022   Pyuria, sterile 02/03/2022   Hypomagnesemia 12/26/2021   Acute lower UTI 11/05/2021   History of DVT (deep vein thrombosis) 11/05/2021   Depression 10/29/2021   Acute on chronic diastolic CHF (congestive heart failure) (HCC) 10/24/2021   Chronic gastritis 10/19/2021   Weakness 12/02/2020   Chronic heart failure with preserved ejection fraction (HFpEF) (HCC) 12/02/2020   Elevated troponin 11/15/2020   Acute kidney injury superimposed on chronic kidney disease (HCC) 11/15/2020   Unintentional weight loss 11/15/2020   Malnutrition of moderate degree 09/27/2020   Transaminitis 09/23/2020   Acute renal failure superimposed on stage 4 chronic kidney disease (HCC) 09/03/2020   Chronic diastolic CHF (congestive heart failure) (HCC) 09/03/2020   Nausea & vomiting 09/03/2020   Dyspnea 06/28/2020   Chronic diarrhea - responds to  budesonide  09/10/2019   Iron  deficiency anemia 08/19/2018   Spondylosis of lumbar spine 12/24/2017   Renal vein thrombosis (HCC) 09/17/2017   Hoarse voice quality 09/09/2017   Low back pain without sciatica 09/09/2017   Hypokalemia due to excessive gastrointestinal loss of potassium    Leukocytosis    Essential hypertension    Urinary frequency    Type 2 diabetes mellitus (HCC)    Benign essential HTN    Dysphagia    Debility    Encephalopathy    Pressure injury of skin 08/04/2017   Diffuse pulmonary alveolar hemorrhage    SOB (shortness of breath) 10/11/2016   Hyperparathyroidism, secondary renal (HCC) 05/02/2016   Gout 04/11/2015   Diabetic neuropathy associated with type 2 diabetes mellitus (HCC) 01/06/2015   SBO (small bowel obstruction) (HCC) 09/15/2013   Breast cancer of lower-inner quadrant of right female breast (HCC) 03/18/2013   Chronic venous insufficiency of lower extremity 01/06/2013   Health care maintenance 05/08/2011   Seasonal allergies 05/03/2010   Labile hypertension    CAD (coronary artery disease)    Abdominal discomfort    Anemia of chronic kidney failure, stage 4 (severe) (HCC)    History of stroke without residual deficits    Chronic pain    Adnexal mass    GERD (gastroesophageal reflux disease) 01/19/2009   Spinal stenosis, lumbar region, with neurogenic claudication 01/03/2009    Orientation RESPIRATION BLADDER Height & Weight     Self, Time, Situation, Place  Normal Incontinent Weight: 161 lb 2.5 oz (73.1 kg) Height:  (P) 5\' 4"  (162.6 cm)  BEHAVIORAL SYMPTOMS/MOOD NEUROLOGICAL BOWEL NUTRITION STATUS  Incontinent Diet  AMBULATORY STATUS COMMUNICATION OF NEEDS Skin   Limited Assist Verbally Normal                       Personal Care Assistance Level of Assistance  Dressing, Bathing Bathing Assistance: Limited assistance   Dressing Assistance: Limited assistance     Functional Limitations Info  Sight, Speech, Hearing Sight Info:  Adequate Hearing Info: Adequate Speech Info: Adequate    SPECIAL CARE FACTORS FREQUENCY  PT (By licensed PT), OT (By licensed OT), Speech therapy     PT Frequency: 5xs per week OT Frequency: 5xs per wee     Speech Therapy Frequency: 5xs per week      Contractures Contractures Info: Not present    Additional Factors Info  Code Status, Allergies Code Status Info: full Allergies Info: See discharge instructions           Current Medications (07/25/2023):  This is the current hospital active medication list Current Facility-Administered Medications  Medication Dose Route Frequency Provider Last Rate Last Admin   acetaminophen  (TYLENOL ) tablet 325-650 mg  325-650 mg Oral Q4H PRN Versie Gores, PA-C   650 mg at 07/24/23 2219   apixaban  (ELIQUIS ) tablet 2.5 mg  2.5 mg Oral BID Raulkar, Krutika P, MD   2.5 mg at 07/25/23 0855   B-complex with vitamin C tablet 1 tablet  1 tablet Oral Daily Versie Gores, PA-C   1 tablet at 07/25/23 1308   bisacodyl  (DULCOLAX) suppository 10 mg  10 mg Rectal Daily PRN Versie Gores, PA-C       calcium  carbonate (TUMS - dosed in mg elemental calcium ) chewable tablet 200 mg of elemental calcium   1 tablet Oral TID PRN Versie Gores, PA-C   200 mg of elemental calcium  at 07/23/23 1631   cefTAZidime (FORTAZ) 0.5 g in dextrose  5 % 50 mL IVPB  0.5 g Intravenous Q24H Singh, Mayanka, MD 100 mL/hr at 07/24/23 2219 0.5 g at 07/24/23 2219   Chlorhexidine  Gluconate Cloth 2 % PADS 6 each  6 each Topical Q12H Versie Gores, PA-C   6 each at 07/25/23 6578   cholecalciferol  (VITAMIN D3) 25 MCG (1000 UNIT) tablet 1,000 Units  1,000 Units Oral Daily Versie Gores, PA-C   1,000 Units at 07/25/23 4696   colestipol  (COLESTID ) tablet 1 g  1 g Oral TID AC McClung, Sarah A, PA-C   1 g at 07/25/23 2952   Darbepoetin Alfa  (ARANESP ) injection 200 mcg  200 mcg Subcutaneous Q Sat-1800 Versie Gores, PA-C   200 mcg at 07/20/23 2155   diphenhydrAMINE  (BENADRYL )  capsule 25 mg  25 mg Oral Q6H PRN Versie Gores, PA-C       FLUoxetine  (PROZAC ) capsule 20 mg  20 mg Oral Daily Versie Gores, PA-C   20 mg at 07/25/23 8413   gabapentin  (NEURONTIN ) capsule 300 mg  300 mg Oral QHS Versie Gores, PA-C   300 mg at 07/24/23 2220   guaiFENesin -dextromethorphan (ROBITUSSIN DM) 100-10 MG/5ML syrup 5-10 mL  5-10 mL Oral Q6H PRN Versie Gores, PA-C       kidney failure book   Does not apply Once Raulkar, Krutika P, MD       l-methylfolate-B6-B12 (METANX) 3-35-2 MG per tablet 1 tablet  1 tablet Oral Daily Versie Gores, PA-C   1 tablet at 07/25/23 0855   lipase/protease/amylase (CREON ) capsule 36,000 Units  36,000 Units Oral TID Larey Plenty  A, PA-C   36,000 Units at 07/25/23 0610   melatonin tablet 3 mg  3 mg Oral QHS PRN Versie Gores, PA-C   3 mg at 07/21/23 2115   memantine  (NAMENDA ) tablet 5 mg  5 mg Oral Daily Versie Gores, PA-C   5 mg at 07/25/23 0856   milk and molasses enema  1 enema Rectal Daily PRN Versie Gores, PA-C       mirtazapine  (REMERON ) tablet 7.5 mg  7.5 mg Oral QHS Versie Gores, PA-C   7.5 mg at 07/24/23 2220   multivitamin (RENA-VIT) tablet 1 tablet  1 tablet Oral QHS Versie Gores, PA-C   1 tablet at 07/24/23 2220   Oral care mouth rinse  15 mL Mouth Rinse PRN Versie Gores, PA-C       oxyCODONE  (Oxy IR/ROXICODONE ) immediate release tablet 5 mg  5 mg Oral Q4H PRN Versie Gores, PA-C   5 mg at 07/24/23 1318   pantoprazole  (PROTONIX ) EC tablet 40 mg  40 mg Oral BID Versie Gores, PA-C   40 mg at 07/25/23 1610   polyethylene glycol (MIRALAX  / GLYCOLAX ) packet 17 g  17 g Oral Daily PRN Versie Gores, PA-C       prochlorperazine  (COMPAZINE ) tablet 5-10 mg  5-10 mg Oral Q6H PRN Versie Gores, PA-C       Or   prochlorperazine  (COMPAZINE ) suppository 12.5 mg  12.5 mg Rectal Q6H PRN Versie Gores, PA-C       Or   prochlorperazine  (COMPAZINE ) injection 5-10 mg  5-10 mg Intravenous Q6H PRN Versie Gores, PA-C       rivastigmine  (EXELON ) capsule 1.5 mg  1.5 mg Oral BID Versie Gores, PA-C   1.5 mg at 07/25/23 0855   [START ON 07/26/2023] senna-docusate (Senokot-S) tablet 2 tablet  2 tablet Oral Q breakfast Love, Pamela S, PA-C       sevelamer  carbonate (RENVELA ) tablet 1,600 mg  1,600 mg Oral TID WC Alona Jamaica A, PA-C   1,600 mg at 07/25/23 9604   simethicone  (MYLICON) chewable tablet 80 mg  80 mg Oral QID PRN Versie Gores, PA-C       sodium chloride  flush (NS) 0.9 % injection 10-40 mL  10-40 mL Intracatheter Q12H Raulkar, Keven Pel, MD   10 mL at 07/24/23 2225   sodium chloride  flush (NS) 0.9 % injection 10-40 mL  10-40 mL Intracatheter PRN Raulkar, Keven Pel, MD       sorbitol 70 % solution 60 mL  60 mL Oral Once Love, Pamela S, PA-C       tiZANidine  (ZANAFLEX ) tablet 2 mg  2 mg Oral Q8H PRN Versie Gores, PA-C   2 mg at 07/16/23 2310   torsemide  (DEMADEX ) tablet 100 mg  100 mg Oral Once per day on Sunday Tuesday Thursday Saturday Patrick Boor, MD   100 mg at 07/25/23 0855   [START ON 07/26/2023] vancomycin  (VANCOREADY) IVPB 750 mg/150 mL  750 mg Intravenous Q M,W,F-HD Raulkar, Keven Pel, MD         Discharge Medications: Please see discharge summary for a list of discharge medications.  Relevant Imaging Results:  Relevant Lab Results:   Additional Information VW#[id number]  [name], LCSW

## 2023-07-25 NOTE — Plan of Care (Signed)
  Problem: Consults Goal: RH GENERAL PATIENT EDUCATION Description: See Patient Education module for education specifics. Outcome: Progressing   Problem: RH BOWEL ELIMINATION Goal: RH STG MANAGE BOWEL W/MEDICATION W/ASSISTANCE Description: STG Manage Bowel with Medication with mod I Assistance. Outcome: Progressing   Problem: RH SAFETY Goal: RH STG ADHERE TO SAFETY PRECAUTIONS W/ASSISTANCE/DEVICE Description: STG Adhere to Safety Precautions With cues Assistance/Device. Outcome: Progressing Flowsheets (Taken 07/25/2023 1631) STG:Pt will adhere to safety precautions with assistance/device: 5-Supervision/cueing Note: Tele-Sitter/Forgetful   Problem: RH PAIN MANAGEMENT Goal: RH STG PAIN MANAGED AT OR BELOW PT'S PAIN GOAL Description: < 4 with prns Outcome: Not Progressing

## 2023-07-25 NOTE — Progress Notes (Signed)
 Patient ID: [name], female   DOB: 1946/01/19, 78 y.o.   MRN: [id number]  SW sent out SNF referral.   Norval Been, MSW, LCSW Office: [phone] Cell: [phone] Fax: [phone]

## 2023-07-25 NOTE — [place] FL2 (Signed)
 [place]  MEDICAID FL2 LEVEL OF CARE FORM     IDENTIFICATION  Patient Name: [name] Birthdate: [birth year] Sex: female Admission Date (Current Location): 07/08/2023  [facility] and [place] Number:  Producer, television/film/video and Address:  [facility], [address] [place], [place] [address]      Provider Number: [id number]  Attending Physician Name and Address:  Liam Redhead, MD  Relative Name and Phone Number:  Arthurine Billings (daughter) #[phone]    Current Level of Care: Hospital Recommended Level of Care: Skilled Nursing Facility Prior Approval Number:    Date Approved/Denied:   PASRR Number: [id number]  Discharge Plan: SNF    Current Diagnoses: Patient Active Problem List   Diagnosis Date Noted   Septic arthritis (HCC) 07/18/2023   Pseudogout 07/18/2023   Gout flare 06/22/2023   Chronic kidney disease (CKD), stage V (HCC) 06/15/2023   Volume overload 06/15/2023   Mitral regurgitation 09/22/2022   Tricuspid regurgitation 09/22/2022   Acute metabolic encephalopathy 07/28/2022   Acute on chronic anemia 02/18/2022   Pyuria, sterile 02/03/2022   Hypomagnesemia 12/26/2021   Acute lower UTI 11/05/2021   History of DVT (deep vein thrombosis) 11/05/2021   Depression 10/29/2021   Acute on chronic diastolic CHF (congestive heart failure) (HCC) 10/24/2021   Chronic gastritis 10/19/2021   Weakness 12/02/2020   Chronic heart failure with preserved ejection fraction (HFpEF) (HCC) 12/02/2020   Elevated troponin 11/15/2020   Acute kidney injury superimposed on chronic kidney disease (HCC) 11/15/2020   Unintentional weight loss 11/15/2020   Malnutrition of moderate degree 09/27/2020   Transaminitis 09/23/2020   Acute renal failure superimposed on stage 4 chronic kidney disease (HCC) 09/03/2020   Chronic diastolic CHF (congestive heart failure) (HCC) 09/03/2020   Nausea & vomiting 09/03/2020   Dyspnea 06/28/2020   Chronic diarrhea -  responds to budesonide  09/10/2019   Iron  deficiency anemia 08/19/2018   Spondylosis of lumbar spine 12/24/2017   Renal vein thrombosis (HCC) 09/17/2017   Hoarse voice quality 09/09/2017   Low back pain without sciatica 09/09/2017   Hypokalemia due to excessive gastrointestinal loss of potassium    Leukocytosis    Essential hypertension    Urinary frequency    Type 2 diabetes mellitus (HCC)    Benign essential HTN    Dysphagia    Debility    Encephalopathy    Pressure injury of skin 08/04/2017   Diffuse pulmonary alveolar hemorrhage    SOB (shortness of breath) 10/11/2016   Hyperparathyroidism, secondary renal (HCC) 05/02/2016   Gout 04/11/2015   Diabetic neuropathy associated with type 2 diabetes mellitus (HCC) 01/06/2015   SBO (small bowel obstruction) (HCC) 09/15/2013   Breast cancer of lower-inner quadrant of right female breast (HCC) 03/18/2013   Chronic venous insufficiency of lower extremity 01/06/2013   Health care maintenance 05/08/2011   Seasonal allergies 05/03/2010   Labile hypertension    CAD (coronary artery disease)    Abdominal discomfort    Anemia of chronic kidney failure, stage 4 (severe) (HCC)    History of stroke without residual deficits    Chronic pain    Adnexal mass    GERD (gastroesophageal reflux disease) 01/19/2009   Spinal stenosis, lumbar region, with neurogenic claudication 01/03/2009    Orientation RESPIRATION BLADDER Height & Weight     Self, Time, Situation, Place  Normal Incontinent Weight: 161 lb 2.5 oz (73.1 kg) Height:  (P) 5\' 4"  (162.6 cm)  BEHAVIORAL SYMPTOMS/MOOD NEUROLOGICAL BOWEL NUTRITION STATUS  Incontinent Diet  AMBULATORY STATUS COMMUNICATION OF NEEDS Skin   Limited Assist Verbally Normal                       Personal Care Assistance Level of Assistance  Dressing, Bathing Bathing Assistance: Limited assistance   Dressing Assistance: Limited assistance     Functional Limitations Info  Sight, Speech, Hearing  Sight Info: Adequate Hearing Info: Adequate Speech Info: Adequate    SPECIAL CARE FACTORS FREQUENCY  PT (By licensed PT), OT (By licensed OT), Speech therapy     PT Frequency: 5xs per week OT Frequency: 5xs per wee     Speech Therapy Frequency: 5xs per week      Contractures Contractures Info: Not present    Additional Factors Info  Code Status, Allergies Code Status Info: full Allergies Info: See discharge instructions           Current Medications (07/25/2023):  This is the current hospital active medication list Current Facility-Administered Medications  Medication Dose Route Frequency Provider Last Rate Last Admin   acetaminophen  (TYLENOL ) tablet 325-650 mg  325-650 mg Oral Q4H PRN Versie Gores, PA-C   650 mg at 07/24/23 2219   apixaban  (ELIQUIS ) tablet 2.5 mg  2.5 mg Oral BID Raulkar, Krutika P, MD   2.5 mg at 07/25/23 0855   B-complex with vitamin C tablet 1 tablet  1 tablet Oral Daily Versie Gores, PA-C   1 tablet at 07/25/23 1610   bisacodyl  (DULCOLAX) suppository 10 mg  10 mg Rectal Daily PRN Versie Gores, PA-C       calcium  carbonate (TUMS - dosed in mg elemental calcium ) chewable tablet 200 mg of elemental calcium   1 tablet Oral TID PRN Versie Gores, PA-C   200 mg of elemental calcium  at 07/23/23 1631   cefTAZidime (FORTAZ) 0.5 g in dextrose  5 % 50 mL IVPB  0.5 g Intravenous Q24H Singh, Mayanka, MD 100 mL/hr at 07/24/23 2219 0.5 g at 07/24/23 2219   Chlorhexidine  Gluconate Cloth 2 % PADS 6 each  6 each Topical Q12H Versie Gores, PA-C   6 each at 07/25/23 9604   cholecalciferol  (VITAMIN D3) 25 MCG (1000 UNIT) tablet 1,000 Units  1,000 Units Oral Daily Versie Gores, PA-C   1,000 Units at 07/25/23 5409   colestipol  (COLESTID ) tablet 1 g  1 g Oral TID AC McClung, Sarah A, PA-C   1 g at 07/25/23 8119   Darbepoetin Alfa  (ARANESP ) injection 200 mcg  200 mcg Subcutaneous Q Sat-1800 McClung, Sarah A, PA-C   200 mcg at 07/20/23 2155   diphenhydrAMINE   (BENADRYL ) capsule 25 mg  25 mg Oral Q6H PRN Versie Gores, PA-C       FLUoxetine  (PROZAC ) capsule 20 mg  20 mg Oral Daily Versie Gores, PA-C   20 mg at 07/25/23 1478   gabapentin  (NEURONTIN ) capsule 300 mg  300 mg Oral QHS Versie Gores, PA-C   300 mg at 07/24/23 2220   guaiFENesin -dextromethorphan (ROBITUSSIN DM) 100-10 MG/5ML syrup 5-10 mL  5-10 mL Oral Q6H PRN Versie Gores, PA-C       kidney failure book   Does not apply Once Raulkar, Krutika P, MD       l-methylfolate-B6-B12 (METANX) 3-35-2 MG per tablet 1 tablet  1 tablet Oral Daily Versie Gores, PA-C   1 tablet at 07/25/23 0855   lipase/protease/amylase (CREON ) capsule 36,000 Units  36,000 Units Oral TID Larey Plenty  A, PA-C   36,000 Units at 07/25/23 0610   melatonin tablet 3 mg  3 mg Oral QHS PRN Versie Gores, PA-C   3 mg at 07/21/23 2115   memantine  (NAMENDA ) tablet 5 mg  5 mg Oral Daily Versie Gores, PA-C   5 mg at 07/25/23 0856   milk and molasses enema  1 enema Rectal Daily PRN Versie Gores, PA-C       mirtazapine  (REMERON ) tablet 7.5 mg  7.5 mg Oral QHS Versie Gores, PA-C   7.5 mg at 07/24/23 2220   multivitamin (RENA-VIT) tablet 1 tablet  1 tablet Oral QHS Versie Gores, PA-C   1 tablet at 07/24/23 2220   Oral care mouth rinse  15 mL Mouth Rinse PRN Versie Gores, PA-C       oxyCODONE  (Oxy IR/ROXICODONE ) immediate release tablet 5 mg  5 mg Oral Q4H PRN Versie Gores, PA-C   5 mg at 07/24/23 1318   pantoprazole  (PROTONIX ) EC tablet 40 mg  40 mg Oral BID Versie Gores, PA-C   40 mg at 07/25/23 1610   polyethylene glycol (MIRALAX  / GLYCOLAX ) packet 17 g  17 g Oral Daily PRN Versie Gores, PA-C       prochlorperazine  (COMPAZINE ) tablet 5-10 mg  5-10 mg Oral Q6H PRN Versie Gores, PA-C       Or   prochlorperazine  (COMPAZINE ) suppository 12.5 mg  12.5 mg Rectal Q6H PRN Jonelle Neri, Sarah A, PA-C       Or   prochlorperazine  (COMPAZINE ) injection 5-10 mg  5-10 mg Intravenous Q6H PRN  Versie Gores, PA-C       rivastigmine  (EXELON ) capsule 1.5 mg  1.5 mg Oral BID Versie Gores, PA-C   1.5 mg at 07/25/23 0855   [START ON 07/26/2023] senna-docusate (Senokot-S) tablet 2 tablet  2 tablet Oral Q breakfast Love, Pamela S, PA-C       sevelamer  carbonate (RENVELA ) tablet 1,600 mg  1,600 mg Oral TID WC Alona Jamaica A, PA-C   1,600 mg at 07/25/23 9604   simethicone  (MYLICON) chewable tablet 80 mg  80 mg Oral QID PRN Versie Gores, PA-C       sodium chloride  flush (NS) 0.9 % injection 10-40 mL  10-40 mL Intracatheter Q12H Raulkar, Keven Pel, MD   10 mL at 07/24/23 2225   sodium chloride  flush (NS) 0.9 % injection 10-40 mL  10-40 mL Intracatheter PRN Raulkar, Keven Pel, MD       sorbitol 70 % solution 60 mL  60 mL Oral Once Love, Pamela S, PA-C       tiZANidine  (ZANAFLEX ) tablet 2 mg  2 mg Oral Q8H PRN Versie Gores, PA-C   2 mg at 07/16/23 2310   torsemide  (DEMADEX ) tablet 100 mg  100 mg Oral Once per day on Sunday Tuesday Thursday Saturday Patrick Boor, MD   100 mg at 07/25/23 0855   [START ON 07/26/2023] vancomycin  (VANCOREADY) IVPB 750 mg/150 mL  750 mg Intravenous Q M,W,F-HD Raulkar, Keven Pel, MD         Discharge Medications: Please see discharge summary for a list of discharge medications.  Relevant Imaging Results:  Relevant Lab Results:   Additional Information VW#[id number]  [name], LCSW

## 2023-07-25 NOTE — Discharge Summary (Signed)
 Physician Discharge Summary  Patient ID: [name] MRN: [id number] DOB/AGE: 76-Sep-1947 76 y.o.  Admit date: 07/08/2023 Discharge date: 08/01/2023  Discharge Diagnoses:  Principal Problem:   Debility Active Problems:   GERD (gastroesophageal reflux disease)   Type 2 diabetes mellitus (HCC)   Leukocytosis   Low back pain without sciatica   Spondylosis of lumbar spine   Iron  deficiency anemia   Malnutrition of moderate degree   History of DVT (deep vein thrombosis)   Acute on chronic anemia   Septic arthritis (HCC)   Pseudogout   Knee effusion   Hand steal syndrome, sequela   ESRD on dialysis [facility])   Discharged Condition: stable  Significant Diagnostic Studies: CT ABDOMEN PELVIS WO CONTRAST Result Date: 07/24/2023 CLINICAL DATA:  Anemia EXAM: CT ABDOMEN AND PELVIS WITHOUT CONTRAST TECHNIQUE: Multidetector CT imaging of the abdomen and pelvis was performed following the standard protocol without IV contrast. RADIATION DOSE REDUCTION: This exam was performed according to the departmental dose-optimization program which includes automated exposure control, adjustment of the mA and/or kV according to patient size and/or use of iterative reconstruction technique. COMPARISON:  CT abdomen and pelvis 04/16/2023. FINDINGS: Lower chest: There are calcified granulomas in the right lung base. Hepatobiliary: No focal liver abnormality is seen. Status post cholecystectomy. No biliary dilatation. Pancreas: Unremarkable. No pancreatic ductal dilatation or surrounding inflammatory changes. Spleen: Normal in size without focal abnormality. Adrenals/Urinary Tract: There is scattered rounded hypodense areas throughout both kidneys which are too small to characterize, likely cysts. There is no hydronephrosis or perinephric fluid. No urinary tract calculi are seen. The adrenal glands and bladder are within normal limits. Stomach/Bowel: Small hiatal hernia is present. Stomach is within normal limits. No  evidence of bowel wall thickening, distention, or inflammatory changes. There are postsurgical changes in the right colon. The appendix is not seen. There are scattered colonic diverticula. There is a large amount of stool throughout the entire colon. Vascular/Lymphatic: Aortic atherosclerosis. No enlarged abdominal or pelvic lymph nodes. Reproductive: Status post hysterectomy. No adnexal masses. Other: There is right breast skin thickening with calcifications and surgical clips as well as edema throughout the right breast. This was noted on the prior examination. Skin thickening has slightly decreased. There is no ascites or focal abdominal wall hernia. Musculoskeletal: There is heterogeneous sclerosis throughout the spine similar to the prior study. IMPRESSION: 1. No acute localizing process in the abdomen or pelvis. 2. Large amount of stool throughout the colon compatible with constipation. 3. Colonic diverticulosis without evidence for diverticulitis. 4. Stable heterogeneous sclerosis throughout the spine. 5. Mild decrease in right breast skin thickening with calcifications and surgical clips. Correlate clinically. 6. Aortic atherosclerosis. Aortic Atherosclerosis (ICD10-I70.0). Electronically Signed   By: Tyron Gallon M.D.   On: 07/24/2023 20:10   CT HEAD WO CONTRAST ( ) Result Date: 07/19/2023 EXAM: CT HEAD WITHOUT 07/19/2023 12:01:08 AM TECHNIQUE: CT of the head was performed without the administration of intravenous contrast. Automated exposure control, iterative reconstruction, and/or weight based adjustment of the mA/kV was utilized to reduce the radiation dose to as low as reasonably achievable. COMPARISON: 07/28/2022 CLINICAL HISTORY: Fall. Fall FINDINGS: BRAIN AND VENTRICLES: There is no acute intracranial hemorrhage, mass effect or midline shift. No abnormal extra-axial fluid collection. The gray-white differentiation is maintained without evidence of an acute infarct. There is no evidence of  hydrocephalus. Global cortical atrophy. Subcortical and periventricular small vessel ischemic changes. ORBITS: The visualized portion of the orbits demonstrate no acute abnormality. SINUSES: The visualized paranasal sinuses and  mastoid air cells demonstrate no acute abnormality. SOFT TISSUES AND SKULL: No acute abnormality of the visualized skull or soft tissues. IMPRESSION: 1. No acute intracranial abnormality. 2. Global cortical atrophy. 3. Subcortical and periventricular small vessel ischemic changes. Electronically signed by: Zadie Herter MD 07/19/2023 12:04 AM EDT RP Workstation: [id number]   DG CHEST PORT 1 VIEW Result Date: 07/14/2023 CLINICAL DATA:  Leukocytosis, CHF EXAM: PORTABLE CHEST 1 VIEW COMPARISON:  07/02/2023 FINDINGS: Single frontal view of the chest demonstrates stable right internal jugular central venous catheter. Cardiac silhouette is unremarkable. No acute airspace disease, effusion, or pneumothorax. No acute bony abnormalities. IMPRESSION: 1. No acute intrathoracic process. Electronically Signed   By: Bobbye Burrow M.D.   On: 07/14/2023 17:13   DG Knee Complete 4 Views Right Result Date: 07/10/2023 CLINICAL DATA:  Chronic right knee pain. Worsened over the past few days. EXAM: RIGHT KNEE - COMPLETE 4+ VIEW COMPARISON:  None Available. FINDINGS: Moderate medial and mild to moderate lateral compartment chondrocalcinosis. Mild tricompartmental joint space narrowing. Mild superior greater than inferior patellar degenerative spurring. Moderate knee joint effusion. No acute fracture or dislocation. Moderate atherosclerotic calcifications. IMPRESSION: 1. Mild tricompartmental osteoarthritis. 2. Moderate knee joint effusion. 3. Moderate medial and mild to moderate lateral compartment chondrocalcinosis. This is nonspecific and may be secondary to osteoarthritis, calcium  pyrophosphate dihydrate deposition disease (CPPD), among other etiologies. Electronically Signed   By: Bertina Broccoli M.D.    On: 07/10/2023 17:49   MR LUMBAR SPINE WO CONTRAST Result Date: 07/09/2023 CLINICAL DATA:  Low back pain, cauda equina syndrome suspected. EXAM: MRI LUMBAR SPINE WITHOUT CONTRAST TECHNIQUE: Multiplanar, multisequence MR imaging of the lumbar spine was performed. No intravenous contrast was administered. COMPARISON:  MRI of the lumbar spine without contrast 12/18/2022. FINDINGS: Segmentation: 5 non rib-bearing lumbar type vertebral bodies are present. The lowest fully formed vertebral body is L5. Alignment: Minimal anterolisthesis at L4-5 is stable. Mild leftward curvature is centered at L3, stable. Vertebrae: Heterogeneous marrow signal is again noted, less depressed than on the prior exam. Conus medullaris and cauda equina: Conus extends to the T12-L1 level. Conus and cauda equina appear normal. Paraspinal and other soft tissues: Bilateral renal cysts are stable. The visualized abdomen is otherwise unremarkable. Disc levels: T12-L1: Mild facet hypertrophy is present bilaterally. No focal disc protrusion or stenosis is present. L1-2: Lateral disc protrusions are more prominent left than right. Moderate facet hypertrophy is noted. There is some crowding of nerve roots posteriorly without focal stenosis. L2-3: A broad-based disc protrusion and moderate facet hypertrophy lead to a similar appearance of severe central canal stenosis. Moderate foraminal narrowing bilaterally is also similar the prior exam. L3-4: Chronic loss of disc height is present. Moderate right facet hypertrophy is present. Mild right subarticular narrowing is present. Moderate right and mild left foraminal stenosis is present. No significant interval change is present. L4-5: Uncovering of a broad-based disc protrusion is present. Right laminectomy is again noted. Moderate subarticular narrowing is stable, right greater than left. Mild foraminal narrowing is also stable, right greater than left. L5-S1: A central disc protrusion and annular tear  is present. The annular tear is more evident than on the prior exam. Mild subarticular and foraminal narrowing bilaterally is stable. IMPRESSION: 1. Central disc protrusion and annular tear at L5-S1. The annular tear is more evident than on the prior exam. This may represent some progression without significant change in mild subarticular and foraminal narrowing bilaterally at this level. 2. Stable severe central canal stenosis and moderate foraminal narrowing  bilaterally at L2-3. 3. Mild right subarticular narrowing at L3-4 with moderate right and mild left foraminal stenosis. 4. Stable moderate subarticular narrowing at L4-5, right greater than left. 5. Heterogeneous marrow signal is again noted, less depressed than on the prior exam. Electronically Signed   By: Audree Leas M.D.   On: 07/09/2023 19:47   MR CERVICAL SPINE WO CONTRAST Result Date: 07/09/2023 CLINICAL DATA:  Myelopathy, acute, cervical spine. EXAM: MRI CERVICAL SPINE WITHOUT CONTRAST TECHNIQUE: Multiplanar, multisequence MR imaging of the cervical spine was performed. No intravenous contrast was administered. COMPARISON:  None Available. FINDINGS: Alignment: Slight degenerative retrolisthesis is present at C4-5 and C5-6. Mild straightening of the normal cervical lordosis is present. Vertebrae: Marrow signal and vertebral body heights are normal. No focal osseous lesions are present. Cord: Normal signal and morphology. Posterior Fossa, vertebral arteries, paraspinal tissues: Craniocervical junction is normal. Flow is present in the vertebral arteries bilaterally. Visualized intracranial contents are normal. Disc levels: C2-3: Negative. C3-4: Uncovertebral spurring leads to moderate right foraminal stenosis. The central canal is patent. C4-5: A broad-based disc osteophyte complex is present. This partially effaces the ventral CSF. Moderate foraminal narrowing is worse right than left. C5-6: A broad-based disc osteophyte complex effaces the  ventral CSF. Uncovertebral and facet hypertrophy contribute to moderate foraminal narrowing bilaterally, right greater than left. C6-7: Mild uncovertebral spurring and slight disc bulging is present without significant stenosis. C7-T1: Moderate facet hypertrophy is worse on the left. No focal disc protrusion or stenosis is present. IMPRESSION: 1. Moderate right foraminal stenosis at C3-4. 2. Moderate foraminal narrowing bilaterally at C4-5 and C5-6 is worse on the right. 3. Mild central canal stenosis at C4-5. 4. Moderate central canal stenosis at C5-6. 5. Moderate facet hypertrophy at C7-T1 is worse on the left. Electronically Signed   By: Audree Leas M.D.   On: 07/09/2023 19:41    Labs:  Basic Metabolic Panel: Recent Labs  Lab 07/26/23 1335 07/29/23 1407 07/31/23 2214  NA 136 139 134*  K 4.7 5.6* 5.4*  CL 96* 100 97*  CO2 24 22 24   GLUCOSE 153* 108* 123*  BUN 68* 87* 83*  CREATININE 6.90* 7.66* 7.43*  CALCIUM  9.6 9.7 9.5  PHOS 3.3 2.9 3.5    CBC: Recent Labs  Lab 07/26/23 1336 07/29/23 1406 07/31/23 2214  WBC 11.8* 11.3* 8.9  NEUTROABS  --   --  6.7  HGB 7.4* 7.5* 7.4*  HCT 25.2* 25.6* 25.0*  MCV 95.8 95.5 95.1  PLT 395 419* 395    CBG: No results for input(s): "GLUCAP" in the last 168 hours.   Brief HPI:   [name] is a 78 y.o. 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  She was admitted on 06/11/2023 with progressive shortness of breath and weight gain, heart failure exacerbation and attempted IV diuresis were ineffective.  Internal jugular and left brachial artery to axillary PTFE AV graft was placed by Dr. Vikki Graves  on 04/04.  She developed mild steal syndrome with numbness right hand as well as fever with leukocytosis and erythema left AC site which was felt to be due to graft reaction versus infection.  She was treated with Keflex  from 04/07 to 04/16.  She  continued have issues with pain secondary to steal syndrome and LUE AV graft was removed with vein patch angioplasty on 04/14.  She had rising WBC with low-grade fevers and antibiotics were changed to Zosyn  on 04/17 and vancomycin  added on 04/19 due to ongoing fevers with rising WBC.  Blood cultures x 2 were negative.  Pain control was improving.  MRI LUE done revealing mild to moderate elbow effusion with common flexor tendinosis or possibly inflammatory phlegmon just anterior to muscle at biceps musculotendinous junction with question of developing abscess.  VVS recommended continuing antibiotic therapy which was changed to Augmentin  x 10 days at discharge.  Medical issues were optimized but she continued to be limited by debility, pain, impaired cognition and was requiring mod to max assist with ADLs and min to mod assist for mobility.  She was independent with the use of rollator prior to admission.  CIR was recommended due to functional decline.   Hospital Course: [name] was admitted to rehab 07/08/2023 for inpatient therapies to consist of PT, ST and OT at least three hours five days a week. Past admission physiatrist, therapy team and rehab RN have worked together to provide customized collaborative inpatient rehab. Her blood sugars were monitored on ac/hs basis and sliding scale insulin  was used for elevated blood sugars.  Carb modified restrictions were added to diet.  Hemodialysis has been ongoing at MWF at the end of the day to help with tolerance of therapy.  Metabolic bone disease monitored and calcium /phosphorus is at goal on current dose Sevelamer  and renal diet. Hyperkalemia has resolved with renal restrictions. Blood pressures currently  elevated but to be monitored with dialysis and UF.   She was maintained on Vancomycin  and Augmentin  initially due to concerns of LUE abscess/cellulitis. Her CBC has been monitored with serial checks and H/H noted to be trending down. She was noted to have one heme positive stool on 05/05 and GI consulted for input. Dr. Lavaughn [place] recommended decreasing DOAC as well as CT abdomen/pelvis to rule our bleed or other causes of anemia. This was negative for acute process but showed large amount of stool in colon. Senna was added to help manage constipation. H/H has been relatively stable in 7 range. MRI lumbar spine done due to acute on chronic back pain and showed some progression in annular tear/protrusion at L5-S1 and stable stenosis. MRI C spine showed moderate canal stenosis C5-6 and facet arthropathy C7-T1 worse on left. She continues on Zanaflex  prn for back pain. Hydrocodone  was titrated up to Oxycodone  and has been used prn for back and knee pain.   She reported significant right knee pain with increase in activity which was a limiting factor.  X-rays of right knee done showing moderate joint effusion as well as moderate medial and mild to moderate lateral compartment chondrocalcinosis question secondary to osteoarthritis or CPPD among other etiologies.   Knees were aspirated on 04/28.  Left showed 30,000 WBC and Right showed 89,750 WBC both also showed intra cellular monosodium urate crystals. Blood cultures X 2 were negative.She was treated with dose of colchicine  and Dr. Lennie Ra consulted for input due to concerns of septic knee. Augmentin  was changed to ceftriaxone  and she underwent I&D of right knee infection on 05/02 by Dr. Curtiss Dowdy. Cultures sent and were negative. Gram stain negative for WBC or organisms.   She continued to be limited by knee pain and hydrocodone  changed to oxycodone  for pain management. Namenda  and Metanx were added to help manage neuropathic pain.  Follow up CBC shows improvement in  leucocytosis but WBC continues to fluctuate in 11-12 range. ID recommends Vancomycin  and  Ceftazidime  w/ HD for 4 weeks or EOT 05/30. Zanaflex  was d/c on 05/09 due to hypotension. Flector  patch added to right knee to help with pain management and activity tolerance. Most recent CBC showed H/H and WBC to be relatively stable. She has made gains with therapy but continues to be limited by cognitive deficits, weakness and knee pain. She continues to require assistance and family has elected on SNF for progressive therapy. Bed is available at [facility] and she was discharged on 08/01/23   Rehab course: During patient's stay in rehab weekly team conferences were held to monitor patient's progress, set goals and discuss barriers to discharge. At admission, patient required mod assist with mobility and max assist with ADL tasks.  She  has had improvement in activity tolerance, balance, postural control as well as ability to compensate for deficits. She requires min assist for transfers, set up assist with UB care and mod assist with LB dressing. She requires CGA for transfers and to ambulate 34' with verbal cues for safety, foot clearance and safe use of DME.    Discharge disposition: 03-Skilled Nursing Facility  Diet: Renal diet with 1200 cc FR/day  Special Instructions: Monitor I/O and weights daily.  Will need to follow up with ID at end of antibiotic treatment.   Allergies as of 08/01/2023       Reactions   Anesthetics, Halogenated Other (See Comments)   Per patient's daughter, she can not tolerate anesthetics.    Etomidate  Other (See Comments)   Heart stopped during anesthesia   Fentanyl  Other (See Comments)   Heart stopped during anesthesia   Percocet [oxycodone -acetaminophen ] Other (See Comments)   Headaches   Diazepam Other (See Comments)   Agitation   Lorazepam  Nausea Only   Tramadol  Hcl Nausea And Vomiting   Haloperidol Nausea And Vomiting   **Haldol**   Morphine  Sulfate Nausea  Only   agitation   Propoxyphene Nausea And Vomiting   **Darvocet** no longer available   Versed  [midazolam ] Other (See Comments)   Heart stopped during anesthesia        Medication List     STOP taking these medications    amoxicillin -clavulanate 500-125 MG tablet Commonly known as: AUGMENTIN    famotidine  20 MG tablet Commonly known as: PEPCID    HYDROcodone -acetaminophen  5-325 MG tablet Commonly known as: NORCO/VICODIN   tiZANidine  2 MG tablet Commonly known as: ZANAFLEX    vancomycin  IVPB Replaced by: vancomycin  750 MG/150ML Soln       TAKE these medications    acetaminophen  325 MG tablet Commonly known as: TYLENOL  Take 2 tablets (650 mg total) by mouth every 6 (six) hours as needed for mild pain (pain score 1-3) or headache. What changed: reasons to take this   apixaban  2.5 MG Tabs tablet Commonly known as: ELIQUIS  Take 1 tablet (2.5 mg total) by mouth 2 (two) times daily. What changed:  medication strength how much to take   B-complex with vitamin C tablet Take 1 tablet by mouth daily.   calcium  carbonate 500 MG chewable tablet Commonly known as: TUMS - dosed in mg elemental calcium  Chew 1 tablet (200 mg of elemental calcium  total) by mouth 3 (three) times daily as needed for indigestion or heartburn.   cefTAZidime  0.5 g in dextrose  5 % 50 mL Inject 0.5 g into the vein daily. Thorough May 30th   colestipol  1 g tablet Commonly  known as: COLESTID  Take 1 tablet (1 g total) by mouth 3 (three) times daily.   Creon  36000-114000 units Cpep capsule Generic drug: lipase/protease/amylase Take 1 capsule (36,000 Units total) by mouth 3 (three) times daily before meals.   diclofenac  1.3 % Ptch Commonly known as: FLECTOR  Place 1 patch onto the skin 2 (two) times daily.   diclofenac  Sodium 1 % Gel Commonly known as: VOLTAREN  Apply 2 g topically 3 (three) times daily as needed (right knee).   FLUoxetine  20 MG capsule Commonly known as: PROZAC  Take 20 mg by  mouth daily.   gabapentin  300 MG capsule Commonly known as: NEURONTIN  Take 300 mg by mouth at bedtime.   l-methylfolate-B6-B12 3-35-2 MG Tabs tablet Commonly known as: METANX Take 1 tablet by mouth daily.   melatonin 3 MG Tabs tablet Take 1 tablet (3 mg total) by mouth at bedtime as needed.   memantine  5 MG tablet Commonly known as: NAMENDA  Take 1 tablet (5 mg total) by mouth daily.   mirtazapine  7.5 MG tablet Commonly known as: REMERON  Take 7.5 mg by mouth at bedtime.   mouth rinse Liqd solution 15 mLs by Mouth Rinse route as needed (for oral care).   multivitamin Tabs tablet Take 1 tablet by mouth at bedtime.   oxyCODONE  5 MG immediate release tablet--Rx # 10 pills  Commonly known as: Oxy IR/ROXICODONE  Take 1 tablet (5 mg total) by mouth every 6 (six) hours as needed for severe pain (pain score 7-10).   pantoprazole  40 MG tablet Commonly known as: PROTONIX  Take 1 tablet (40 mg total) by mouth 2 (two) times daily. What changed: when to take this   prochlorperazine  5 MG tablet Commonly known as: COMPAZINE  Take 1-2 tablets (5-10 mg total) by mouth every 6 (six) hours as needed for nausea.   rivastigmine  1.5 MG capsule Commonly known as: EXELON  Take 1 capsule (1.5 mg total) by mouth 2 (two) times daily.   senna 8.6 MG Tabs tablet Commonly known as: SENOKOT Take 2 tablets (17.2 mg total) by mouth daily.   sevelamer  carbonate 800 MG tablet Commonly known as: RENVELA  Take 2 tablets (1,600 mg total) by mouth 3 (three) times daily with meals.   torsemide  100 MG tablet Commonly known as: DEMADEX  Daily on Sun, Tue, Thurs, Sat. What changed: additional instructions   vancomycin  750 MG/150ML Soln Commonly known as: VANCOREADY Inject 150 mLs (750 mg total) into the vein every Monday, Wednesday, and Friday with hemodialysis. Continue through May 30th. Replaces: vancomycin  IVPB   vitamin D3 25 MCG tablet Commonly known as: CHOLECALCIFEROL  Take 1 tablet (1,000 Units  total) by mouth daily.        Contact information for follow-up providers     Merl Star, MD Follow up.   Specialty: Internal Medicine Why: Call in 1-2 days for post hospital follow up Contact information: [address] [place] [address] [phone]         Liam Redhead, MD Follow up.   Specialty: Physical Medicine and Rehabilitation Why: office will call you with follow up appointment Contact information: [address] [place] [address] [phone]         Adine Hoof, MD Follow up.   Specialties: Vascular Surgery, Cardiology Contact information: [name] [place] [address] [phone]         Laneta Pintos, MD. Call.   Specialty: Orthopedic Surgery Why: As needed Contact information: [address] [place] [address] [phone]  Liane Redman, MD Follow up.   Specialty: Infectious Diseases Contact information: [address] [place] [address] [phone]         Felecia Hopper, MD Follow up.   Specialty: Gastroenterology Why: Call in 1-2 days for post hospital follow up Contact information: [address] [place] [address] [phone]         [facility] Go on 08/02/2023.   Why: Schedule is Monday, Wednesday, Friday with 11:45 am start time.  On Friday (5/16), please arrive at 11:00 am to complete paperwork prior to treatment. Contact information: [address] [place] [address] [phone]              Contact information for after-discharge care     Destination     [facility] AND REHABILITATION, [facility] Preferred SNF .   Service: Skilled Nursing Contact information: [address] [place]  [address] [phone]                     Signed: Zelda Hickman 08/01/2023, 10:12 AM

## 2023-07-25 NOTE — Progress Notes (Signed)
 Inpatient Rehabilitation Discharge Medication Review by a Pharmacist  A complete drug regimen review was completed for this patient to identify any potential clinically significant medication issues.  High Risk Drug Classes Is patient taking? Indication by Medication  Antipsychotic No   Anticoagulant No Apixaban - renal artery thrombosis  Antibiotic Yes, as an intravenous medication Vancomycin  / ceftazidime- septic arthritis EOT 08/16/2023  Opioid Yes Norco- acute pain OxyIR- acute pain  Antiplatelet No   Hypoglycemics/insulin  No   Vasoactive Medication No   Chemotherapy No   Other Yes Melatonin- sleep Memantine - alzheimer's / dementia Protonix - GERD Fluoxetine - MDD Mirtazapine - MDD Colestipol - chronic diarrhea Pepcid - GERD Protonix - GERD Renvela - hyperphosphatemia 2/2 ESRD Aranesp - anemia 2/2 ESRD Gabapentin - neuropathic pain Tizanidine - muscle spasms     Type of Medication Issue Identified Description of Issue Recommendation(s)  Drug Interaction(s) (clinically significant)     Duplicate Therapy     Allergy     No Medication Administration End Date     Incorrect Dose     Additional Drug Therapy Needed     Significant med changes from prior encounter (inform family/care partners about these prior to discharge).    Other       Clinically significant medication issues were identified that warrant physician communication and completion of prescribed/recommended actions by midnight of the next day:  No   Time spent performing this drug regimen review (minutes):  30   Thank you for allowing pharmacy to be a part of this patient's care.   Marleta Simmer BS, PharmD, BCPS Clinical Pharmacist 07/25/2023 3:05 PM  Contact: [phone] after 3 PM  "Be curious, not judgmental..." -Rumalda Counter

## 2023-07-25 NOTE — Progress Notes (Signed)
 Patient ID: [name], female   DOB: [birth year], 78 y.o.   MRN: [id number] [facility] Progress Note   Assessment/ Plan:   1.  Functional debility/deficits status post acute hypoxic respiratory failure/uremia: Currently admitted to inpatient rehabilitation unit for intensive physical therapy and Occupational Therapy with which she is making progress. 2.  New start ESRD: With recent frequent hospitalizations for volume overload responding to intravenous diuretics but currently at a point where she needed to start dialysis with worsening uremia/volume excess.  Ongoing hemodialysis on MWF schedule this week with plans to discharge home on Saturday 5/10 and begin dialysis at [facility] on Monday, 07/29/2023. 3. Anemia: With recent EGD that did not show any evidence of bleeding.  On chronic Eliquis  for history of renal vein thrombosis-dose adjusted to 2.5 mg twice daily.  CT scan of the abdomen and pelvis did not show any significant lesion (but showed evidence of bowel distention from constipation) 4. CKD-MBD: Calcium  and phosphorus levels currently at goal, continue to monitor on renal diet and sevelamer .  PTH at goal. 5. Nutrition: Continue renal diet with fluid restriction and ongoing protein supplementation. 6. Hypertension: Blood pressures currently elevated, continue to monitor with hemodialysis/UF.  Subjective:   Her discharge that was slated for 5/9 has been postponed to 5/10.  Reevaluation underway for GI bleed based on downtrending hemoglobin and Hemoccult positive stool.   Objective:   BP (!) 152/61 (BP Location: Right Wrist)   Pulse 80   Temp 98.9 F (37.2 C)   Resp 18   Ht (P) 5\' 4"  (1.626 m)   Wt 73.1 kg   SpO2 99%   BMI (P) 27.66 kg/m   Physical Exam: Gen: Comfortably sleeping in bed, difficult to arouse CVS: Pulse regular rhythm, normal rate, S1 and S2 normal Resp: Clear to auscultation bilaterally, no distinct rales or rhonchi.  Right IJ  TDC in place Abd: Soft, flat, nontender, bowel sounds normal Ext: Trace ankle edema.  Left upper arm AV graft excised.  Labs: BMET Recent Labs  Lab 07/18/23 1045 07/20/23 0553 07/22/23 1530 07/24/23 0827  NA 135 134* 137 136  K 4.2 4.4 5.2* 4.8  CL 92* 92* 97* 98  CO2 23 22 24 25   GLUCOSE 106* 143* 162* 164*  BUN 72* 63* 69* 62*  CREATININE 6.72* 6.15* 6.81* 6.46*  CALCIUM  10.1 9.9 9.7 9.7  PHOS 4.2  --  3.1 3.8   CBC Recent Labs  Lab 07/21/23 0726 07/22/23 1044 07/24/23 0827 07/25/23 0655  WBC 13.1* 12.0* 12.3* 11.0*  NEUTROABS  --  9.0*  --  8.8*  HGB 7.7* 8.0* 7.3* 7.3*  HCT 24.9* 26.7* 24.6* 24.4*  MCV 92.9 94.0 95.7 94.2  PLT 419* 425* 394 370      Medications:     apixaban   2.5 mg Oral BID   B-complex with vitamin C  1 tablet Oral Daily   Chlorhexidine  Gluconate Cloth  6 each Topical Q12H   cholecalciferol   1,000 Units Oral Daily   colestipol   1 g Oral TID AC   darbepoetin (ARANESP ) injection - DIALYSIS  200 mcg Subcutaneous Q Sat-1800   docusate sodium   100 mg Oral BID   FLUoxetine   20 mg Oral Daily   gabapentin   300 mg Oral QHS   kidney failure book   Does not apply Once   l-methylfolate-B6-B12  1 tablet Oral Daily   lipase/protease/amylase  36,000 Units Oral TID AC   memantine   5 mg Oral Daily  mirtazapine   7.5 mg Oral QHS   multivitamin  1 tablet Oral QHS   pantoprazole   40 mg Oral BID   rivastigmine   1.5 mg Oral BID   sevelamer  carbonate  1,600 mg Oral TID WC   sodium chloride  flush  10-40 mL Intracatheter Q12H   torsemide   100 mg Oral Once per day on Sunday Tuesday Thursday Saturday   Clevester Dally, MD 07/25/2023, 8:44 AM

## 2023-07-25 NOTE — Progress Notes (Signed)
 PROGRESS NOTE   Subjective/Complaints: No new complaints this morning CT abdomen reviewed and is negative for any bleed, Eliquis  reduced to 2.5mg  as per GI recommendations  ROS: +upper and lower extremity weakness- improved, +bowel incontinence, +right knee pain-limits therapy, continues, but is betterr. Denies CP, SOB, abd pain, n/v/d/c, +dizziness- improved   Objective:   CT ABDOMEN PELVIS WO CONTRAST Result Date: 07/24/2023 CLINICAL DATA:  Anemia EXAM: CT ABDOMEN AND PELVIS WITHOUT CONTRAST TECHNIQUE: Multidetector CT imaging of the abdomen and pelvis was performed following the standard protocol without IV contrast. RADIATION DOSE REDUCTION: This exam was performed according to the departmental dose-optimization program which includes automated exposure control, adjustment of the mA and/or kV according to patient size and/or use of iterative reconstruction technique. COMPARISON:  CT abdomen and pelvis 04/16/2023. FINDINGS: Lower chest: There are calcified granulomas in the right lung base. Hepatobiliary: No focal liver abnormality is seen. Status post cholecystectomy. No biliary dilatation. Pancreas: Unremarkable. No pancreatic ductal dilatation or surrounding inflammatory changes. Spleen: Normal in size without focal abnormality. Adrenals/Urinary Tract: There is scattered rounded hypodense areas throughout both kidneys which are too small to characterize, likely cysts. There is no hydronephrosis or perinephric fluid. No urinary tract calculi are seen. The adrenal glands and bladder are within normal limits. Stomach/Bowel: Small hiatal hernia is present. Stomach is within normal limits. No evidence of bowel wall thickening, distention, or inflammatory changes. There are postsurgical changes in the right colon. The appendix is not seen. There are scattered colonic diverticula. There is a large amount of stool throughout the entire colon.  Vascular/Lymphatic: Aortic atherosclerosis. No enlarged abdominal or pelvic lymph nodes. Reproductive: Status post hysterectomy. No adnexal masses. Other: There is right breast skin thickening with calcifications and surgical clips as well as edema throughout the right breast. This was noted on the prior examination. Skin thickening has slightly decreased. There is no ascites or focal abdominal wall hernia. Musculoskeletal: There is heterogeneous sclerosis throughout the spine similar to the prior study. IMPRESSION: 1. No acute localizing process in the abdomen or pelvis. 2. Large amount of stool throughout the colon compatible with constipation. 3. Colonic diverticulosis without evidence for diverticulitis. 4. Stable heterogeneous sclerosis throughout the spine. 5. Mild decrease in right breast skin thickening with calcifications and surgical clips. Correlate clinically. 6. Aortic atherosclerosis. Aortic Atherosclerosis (ICD10-I70.0). Electronically Signed   By: Tyron Gallon M.D.   On: 07/24/2023 20:10      Recent Labs    07/24/23 0827 07/25/23 0655  WBC 12.3* 11.0*  HGB 7.3* 7.3*  HCT 24.6* 24.4*  PLT 394 370    Recent Labs    07/22/23 1530 07/24/23 0827  NA 137 136  K 5.2* 4.8  CL 97* 98  CO2 24 25  GLUCOSE 162* 164*  BUN 69* 62*  CREATININE 6.81* 6.46*  CALCIUM  9.7 9.7     Intake/Output Summary (Last 24 hours) at 07/25/2023 1058 Last data filed at 07/25/2023 0828 Gross per 24 hour  Intake 460 ml  Output 1000 ml  Net -540 ml        Physical Exam: Vital Signs Blood pressure (!) 152/61, pulse 80, temperature 98.9 F (37.2 C), resp. rate 18,  height (P) 5\' 4"  (1.626 m), weight 73.1 kg, SpO2 99%.  Gen: no distress, normal appearing, resting in bed.  HEENT: oral mucosa pink and moist, NCAT Cardio: Reg rate, no m/r/g appreciated Chest: normal effort, normal rate of breathing, CTAB Abd: soft, non-distended, +BS throughout, nonTTP Ext/MsK: no edema peripherally, LUE with  graft site at L elbow without significant erythema.  R knee wrapped in bulky dressing and ACE wrap. L knee with less tenderness and no effusion.  Psychiatric:     Comments: Pt flat, cooperative, but brighter affect this weekend Musculoskeletal:        General: Swelling and tenderness (left antecubital region) present.  Right knee swollen and tender    Cervical back: Normal range of motion.  Skin:    Comments: AVF site CDI. No erythema. Right heel boggy  Neurological:     Mental Status: She is alert.     Comments: Oriented to self only. Unable to recall age, DOB or place. Poor recall and decrease insight into deficits. She was able to follow simple motor commands. No CN deficits. Limited RUE d/t HD catheter. LUE limited d/t pain. Grip/wrist at least 3-4/5 bilaterally. BLE 3- to 3/5 HF, KE and 4/5 ADF/PF. Decreased LT from ankles down bilaterally. No abnl resting tone. DTR's tr to 1+, stable 5/8 Functional mobility: modA toileting     Assessment/Plan: 1. Functional deficits which require 3+ hours per day of interdisciplinary therapy in a comprehensive inpatient rehab setting. Physiatrist is providing close team supervision and 24 hour management of active medical problems listed below. Physiatrist and rehab team continue to assess barriers to discharge/monitor patient progress toward functional and medical goals  Care Tool:  Bathing    Body parts bathed by patient: Right arm, Left arm, Chest, Abdomen, Front perineal area, Right upper leg, Left upper leg, Right lower leg, Left lower leg, Face   Body parts bathed by helper: Buttocks     Bathing assist Assist Level: Minimal Assistance - Patient > 75%     Upper Body Dressing/Undressing Upper body dressing   What is the patient wearing?: Pull over shirt, Bra    Upper body assist Assist Level: Supervision/Verbal cueing    Lower Body Dressing/Undressing Lower body dressing      What is the patient wearing?: Pants     Lower body  assist Assist for lower body dressing: Contact Guard/Touching assist     Toileting Toileting    Toileting assist Assist for toileting: Moderate Assistance - Patient 50 - 74%     Transfers Chair/bed transfer  Transfers assist     Chair/bed transfer assist level: Contact Guard/Touching assist     Locomotion Ambulation   Ambulation assist      Assist level: Minimal Assistance - Patient > 75% Assistive device: Walker-rolling Max distance: 75ft   Walk 10 feet activity   Assist  Walk 10 feet activity did not occur: Safety/medical concerns  Assist level: Minimal Assistance - Patient > 75% Assistive device: Walker-rolling   Walk 50 feet activity   Assist Walk 50 feet with 2 turns activity did not occur: Safety/medical concerns (pain)         Walk 150 feet activity   Assist Walk 150 feet activity did not occur: Safety/medical concerns (pain)         Walk 10 feet on uneven surface  activity   Assist Walk 10 feet on uneven surfaces activity did not occur: Safety/medical concerns (pain)         Wheelchair  Assist Is the patient using a wheelchair?: Yes Type of Wheelchair: Manual (lightweight)    Wheelchair assist level: Supervision/Verbal cueing Max wheelchair distance: 123ft    Wheelchair 50 feet with 2 turns activity    Assist        Assist Level: Supervision/Verbal cueing   Wheelchair 150 feet activity     Assist      Assist Level: Supervision/Verbal cueing   Blood pressure (!) 152/61, pulse 80, temperature 98.9 F (37.2 C), resp. rate 18, height (P) 5\' 4"  (1.626 m), weight 73.1 kg, SpO2 99%.  Medical Problem List and Plan: 1. Functional deficits secondary to debility after acute respiratory failure and multiple medical issues             -patient may not yet shower             -ELOS/Goals: 10-14 days, supervision goals with PT, OT, SLP  Updated Josephus Nida (daughter)  -continue CIR  Grounds pass ordered  Decrease to  15/7 2.  Antithrombotics: -DVT/anticoagulation:  Pharmaceutical: Eliquis  reduced to 2.5mg  BID as per GI recommendations given positive stool occult. (for renal artery thrombosis)             -antiplatelet therapy: N/A 3. Acute on chronic LBP/Pain Management: Continue Hydrocodone  prn severe pain--monitor for sedation/confusion.              --Was on IV dilaudid  till 04/16-->now on hydrocodone  10 TID on average.              --increased use of Zanaflex  in past 48 hours.              --continue Gabapentin  300 mg/hs 4. Hx of depression/anxiety/Sleep: LCSW to follow for evaluation and support             --melatonin prn for insomnia.              -antipsychotic agents: N/A             -pt on chronic prozac  20mg  daily and remeron  7.5mg  nightly 5. Neuropsych/cognition: This patient is not capable of making decisions on her own behalf. 6. Skin/Wound Care: Routine pressure relief measures.              -float both heels 7. Fluids/Electrolytes/Nutrition: Strict I/O. Labs with HD             --1200 cc FR/day.  8. Cellulitis/Possible abscess LUE: On Van since 04/19 and Zosyn  since 04/17 --WBC trending down to 10.5 today. BC X 2 from 04/19 pending--negative so far.  --has been afebrile X 24 hours.  Zosyn  changed to Augmentin  by Dr. Sunnie England. End date for Vanc/augmentin  is 4/29              -pt reports that arm is feeling less tender -07/14/23 no fevers but now worsening leukocytosis, WBC 19.7 yesterday and 20.4 on repeat today, LUE not really that concerning looking -continue vancomycin /ceftriaxone  -SEE BELOW #[id number]  9. H/o GIB: Monitor H/H as had further drop. Hgb down to             On IV iron /ESA  -07/21/23 Hgb stable 7-8 range  10. HTN: Monitor BP TID--on Torsemide  on non dialysis days.  -07/20/23 [name] stable, apparently a little soft during dialysis, may need midodrine  on dialysis days per nephro; monitor -07/21/23 [name] fine, even a little borderline high at times; monitor  Vitals:   07/24/23 0900  07/24/23 0930 07/24/23 1000 07/24/23 1030  BP: (!) 149/72 (!) 147/57 (!) 151/67 Aaron Aas)  145/56   07/24/23 1100 07/24/23 1130 07/24/23 1134 07/24/23 1139  BP: (!) 165/53 (!) 144/62 (!) 162/64 (!) 147/69   07/24/23 1232 07/24/23 1659 07/24/23 2050 07/25/23 0544  BP: (!) 167/62 (!) 147/67 (!) 96/52 (!) 152/61     11. 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 H/o Gastritis/esophagitis: Most recent admission last month. Continue Protonix  40mg  BID.  --Has required one unit PRBC 04/16.              --monitor for signs of bleeding.   13. Chronic diarrhea:  continue Colestid  1g TID and Creon  36kU tid- changed to after meals as per daughter's request, last BM 5/2  14. H/o cognitive/memory deficits: continue Exelon  1.5mg  bid. Namenda  added  15. ESRD: Now HD dependent--TTS at the end of the day to help with tolerance of therapy             --continue renveal for metabolic bone disease.  Per nephro  -07/20/23 dialysis today, then MWF next week to facilitate d/c  17. T2DM: Hgb A1c- 6.2 and diet controlled. Add CM restrictions.  -d/c ISS, d/c CBGs  18. Bilateral knee pain: voltaren  gel scheduled, right knee sleeve ordered, continue  -now s/p b/l aspirations by [name]  19. Bowel incontinence: lumbar MRI showed and discussed there is no evidence of cauda equina  20. New upper extremity weakness: cervical MRI ordered and discussed that there is moderate canal stenosis, discussed with daughter that oral steroids, steroid injection, surgery are possible interventions- deferred at this time given patient's risk factors  21. Right knee effusion: culture reviewed and with 89,000 WBC concerning for septic arthritis, will consult ID, procal elevated, s/p ortho washout, continue ceftriaxone /vanc -per ID, continue vanc, Fortaz await culture results-- tx 4wks from I&D (4/28)   -07/21/23 intra-op cx NGTD x2d; both cx from knee aspirations NGTD x3d  22. Leukocytosis:repeat CBC ordered, procal  ordered, consulted ID, continue ceftriaxone /vanc Intra-op cultures reviewed and with NGTD  23. Anemia: Hgb reviewed and is stable, CT reviewed and there is no evidence of bleed, Eliquis  reduced to 2.5mg  BID as per GI recommendations  24. Dementia: added namenda  5mg  daily, continue  25. Neuropathic pain: added namenda  5mg  daily, metanx added, discussed with daughter, continue  LOS: 17 days A FACE TO FACE EVALUATION WAS PERFORMED  Lavell Portugal P Jovanni Rash 07/25/2023, 10:58 AM

## 2023-07-25 NOTE — Group Note (Signed)
 Patient Details Name: [name] MRN: [id number] DOB: October 28, 1945 Today's Date: 07/25/2023  Time Calculation: OT Group Time Calculation OT Group Start Time: 1102 OT Group Stop Time: 1202 OT Group Time Calculation (min): 60 min      Group Description: BUE Therex Group: Pt participated in group session with a focus on BUE strength and endurance to facilitate improved activity tolerance and strength for higher level BADLs and functional mobility tasks.    Individual level documentation:  Pt first in engaged in ball tosses around the room to other group members to facilitate improved BUE coordination, strength and cardiovascular endurance for ~ 6 mins as a light warm up  Pt engaged in seated therapeutic activity game where pts were instructed to roll large dice to see what number they rolled, once a number was determined each number correlated to an UB exercise and a number of reps, exercises included bicep curls, upright rows, flys, chest presses and punches. Repetitions ranged from 10-20. Pt chose to use 1 lb weights during session. Pt needing to use one UE at a time d/t LUE pain and weakness. Pt mostly using LUE only for therex.   Eduction provided during activity of various modifications for all exercises. Education provided on the importance of deep breathing as well as determining each pts activity tolerance.   Pts were issued HEP with level 2 theraband with all group members guided through BUE HEP with exercises including:  - Seated Shoulder Horizontal Abduction with Resistance  - 1 x daily - 7 x weekly - 3 sets - 10 reps - Seated Shoulder Extension with Self-Anchored Resistance  - 1 x daily - 7 x weekly - 3 sets - 10 reps - Seated Elbow Flexion with Self-Anchored Resistance  - 1 x daily - 7 x weekly - 3 sets - 10 reps - Alternating Punch with Resistance  - 1 x daily - 7 x weekly - 3 sets - 10 reps    Ended session with guide deep breathing/body scan for 3 mins. Discussed  benefits of deep breathing to manage stress and pain. Pt transported back to room by RT.  Pain: No pain    Willadean Hark 07/25/2023, 1:01 PM

## 2023-07-25 NOTE — Progress Notes (Signed)
 Physical Therapy Session Note  Patient Details  Name: [name] MRN: [id number] Date of Birth: [birth year]  Today's Date: 07/25/2023 PT Individual Time: [phone] and 1300-1357 PT Individual Time Calculation (min): 25 min and 57 min  Short Term Goals: Week 1:  PT Short Term Goal 1 (Week 1): pt will transfer bed<>chair with LRAD and CGA PT Short Term Goal 1 - Progress (Week 1): Progressing toward goal PT Short Term Goal 2 (Week 1): pt will transfer sit<>stand with LRAD and CGA PT Short Term Goal 2 - Progress (Week 1): Progressing toward goal PT Short Term Goal 3 (Week 1): pt will ambulate 54ft with LRAD and CGA PT Short Term Goal 3 - Progress (Week 1): Progressing toward goal Week 2:  PT Short Term Goal 1 (Week 2): STG=LTG due to LOS  Skilled Therapeutic Interventions/Progress Updates:   Treatment Session 1 Received pt semi-reclined in bed asleep. Upon wakening, pt agreeable to PT treatment and reported pain in low back and increasing pain in R knee with mobility - RN notified. Session with emphasis on functional mobility/transfers, dressing, generalized strengthening and endurance, and dynamic standing balance/coordination. Pt transferred supine<>sitting R EOB from flat bed using bedrails and supervision. Pt required increased time and max cues to scoot to EOB and get feet flat - pt declined wearing knee sleeve today.  Donned pants and shoes sitting EOB with mod A for time purposes (increased assist to get RLE threaded through pants). Removed nightgown with mod A and donned bra and pull over shirt with mod A. Stood from EOB with RW and mod A and required mod A to pull pants over hips. Performed stand<>pivot into WC with RW and CGA - limited by pain and unable to extend knees. Sat in WC at sink and washed face/applied lotions with set up assist. Concluded session with pt sitting in WC, needs within reach, and seatbelt alarm on.    Treatment Session 2 Received pt sitting in [facility] with 2  daughters present for family education training - reported they will be assisting pt at discharge. Daughter in law that pt lives with was not present and neither was Josephus Nida (at work). Pt agreeable to PT treatment and remains limited by knee pain R>L (unrated and premedicated). Session with emphasis on discharge planning, functional mobility/transfers, generalized strengthening and endurance, dynamic standing balance/coordination, ambulation, stair navigation, and simulated car transfers.  Pt transported to ortho gym in [facility] dependently for time management purposes. Pt performed all transfers with RW and min A throughout session (cues for hand placement and to feel WC on back of legs prior to sitting). Pt ambulated 33ft with RW and min A (provided by therapist) to car. Pt able to get BLEs in/out of car without assist and family assisted pt with exiting car and ambulating 57ft with RW and CGA upon exiting car - again limited by R knee pain but more motivated when family is present. Pt then performed WC mobility ~47ft using BLE and supervision with emphasis on knee flexion ROM while discussed features of custom lightweight WC.   Family showed pictures of home entry - 1 + 1 3in step to enter home with landing and no rails (not what pt has expressed to therapist prior). Demonstrated technique and pt's daughter assisted with navigating 1 3in curb with RW and min A with mod cues for technique. Pt then performed WC mobility 160ft using BUE and supervision with increased time back to room - emphasis on BUE strength. Pt's daughter's report inability  to provide 24/7 supervision/assist upon discharge and are discussing potential SNF. Pt's daughters with questions regarding medical updates - MD notified. Concluded session with pt sitting in WC, needs within reach, and seatbelt alarm on.   Educated/discussed with pt's family the following: - need for 24/7 min hands on assist - high fall risk and safety concerns with dementia  (fell OOB on 5/2) - pt limited ambulation distances due to bilateral knee pain (R>L) - recommendation for custom lightweight TIS WC to maximize independence with mobility  - recommendation to have ramp installed - technique for assisting pt with transfers, ambulation, and stair navigation (hand placement, body mechanics, and use of gait belt) - alternative for SNF  Therapy Documentation Precautions:  Precautions Precautions: Fall Precaution/Restrictions Comments: dementia Restrictions Weight Bearing Restrictions Per Provider Order: No  Therapy/Group: Individual Therapy [name] Nena Bank PT, DPT 07/25/2023, 6:57 AM

## 2023-07-26 ENCOUNTER — Encounter: Payer: Self-pay | Admitting: Hematology

## 2023-07-26 ENCOUNTER — Other Ambulatory Visit (HOSPITAL_COMMUNITY): Payer: Self-pay

## 2023-07-26 LAB — CBC
HCT: 25.2 % — ABNORMAL LOW (ref 36.0–46.0)
Hemoglobin: 7.4 g/dL — ABNORMAL LOW (ref 12.0–15.0)
MCH: 28.1 pg (ref 26.0–34.0)
MCHC: 29.4 g/dL — ABNORMAL LOW (ref 30.0–36.0)
MCV: 95.8 fL (ref 80.0–100.0)
Platelets: 395 10*3/uL (ref 150–400)
RBC: 2.63 MIL/uL — ABNORMAL LOW (ref 3.87–5.11)
RDW: 17.3 % — ABNORMAL HIGH (ref 11.5–15.5)
WBC: 11.8 10*3/uL — ABNORMAL HIGH (ref 4.0–10.5)
nRBC: 0.2 % (ref 0.0–0.2)

## 2023-07-26 LAB — RENAL FUNCTION PANEL
Albumin: 2.4 g/dL — ABNORMAL LOW (ref 3.5–5.0)
Anion gap: 16 — ABNORMAL HIGH (ref 5–15)
BUN: 68 mg/dL — ABNORMAL HIGH (ref 8–23)
CO2: 24 mmol/L (ref 22–32)
Calcium: 9.6 mg/dL (ref 8.9–10.3)
Chloride: 96 mmol/L — ABNORMAL LOW (ref 98–111)
Creatinine, Ser: 6.9 mg/dL — ABNORMAL HIGH (ref 0.44–1.00)
GFR, Estimated: 6 mL/min — ABNORMAL LOW (ref >60)
Glucose, Bld: 153 mg/dL — ABNORMAL HIGH (ref 70–99)
Phosphorus: 3.3 mg/dL (ref 2.5–4.6)
Potassium: 4.7 mmol/L (ref 3.5–5.1)
Sodium: 136 mmol/L (ref 135–145)

## 2023-07-26 LAB — VANCOMYCIN, RANDOM: Vancomycin Rm: 23 ug/mL

## 2023-07-26 MED ORDER — DEXTROSE 5 % IV SOLN: 0.5000 g | INTRAVENOUS | Status: DC

## 2023-07-26 MED ORDER — TORSEMIDE 100 MG PO TABS: ORAL_TABLET | ORAL | Status: AC

## 2023-07-26 MED ORDER — CALCIUM CARBONATE ANTACID 500 MG PO CHEW: 1.0000 | CHEWABLE_TABLET | Freq: Three times a day (TID) | ORAL | Status: AC | PRN

## 2023-07-26 MED ORDER — RENA-VITE PO TABS: 1.0000 | ORAL_TABLET | Freq: Every day | ORAL | Status: AC

## 2023-07-26 MED ORDER — DICLOFENAC EPOLAMINE 1.3 % EX PTCH: 1.0000 | MEDICATED_PATCH | Freq: Two times a day (BID) | CUTANEOUS | Status: AC

## 2023-07-26 MED ORDER — PANCRELIPASE (LIP-PROT-AMYL) 36000-114000 UNITS PO CPEP
36000.0000 [IU] | ORAL_CAPSULE | Freq: Three times a day (TID) | ORAL | 2 refills | Status: AC
  Filled 2023-07-26: qty 200, 67d supply, fill #0

## 2023-07-26 MED ORDER — L-METHYLFOLATE-B6-B12 3-35-2 MG PO TABS: 1.0000 | ORAL_TABLET | Freq: Every day | ORAL | Status: AC

## 2023-07-26 MED ORDER — SEVELAMER CARBONATE 800 MG PO TABS
1600.0000 mg | ORAL_TABLET | Freq: Three times a day (TID) | ORAL | 0 refills | Status: DC
  Filled 2023-07-26: qty 180, 30d supply, fill #0

## 2023-07-26 MED ORDER — HEPARIN SODIUM (PORCINE) 1000 UNIT/ML IJ SOLN
3200.0000 [IU] | Freq: Once | INTRAMUSCULAR | Status: AC
  Administered 2023-07-26: 3200 [IU]

## 2023-07-26 MED ORDER — MEMANTINE HCL 5 MG PO TABS: 5.0000 mg | ORAL_TABLET | Freq: Every day | ORAL | Status: AC

## 2023-07-26 MED ORDER — SENNA 8.6 MG PO TABS: 2.0000 | ORAL_TABLET | Freq: Every day | ORAL | Status: AC

## 2023-07-26 MED ORDER — VITAMIN D3 25 MCG PO TABS: 1000.0000 [IU] | ORAL_TABLET | Freq: Every day | ORAL | Status: AC

## 2023-07-26 MED ORDER — VANCOMYCIN HCL 750 MG/150ML IV SOLN
INTRAVENOUS | Status: AC
  Filled 2023-07-26: qty 150

## 2023-07-26 MED ORDER — MELATONIN 3 MG PO TABS
3.0000 mg | ORAL_TABLET | Freq: Every evening | ORAL | 0 refills | Status: AC | PRN
  Filled 2023-07-26: qty 30, 30d supply, fill #0

## 2023-07-26 MED ORDER — HEPARIN SODIUM (PORCINE) 1000 UNIT/ML IJ SOLN
INTRAMUSCULAR | Status: AC
  Filled 2023-07-26: qty 4

## 2023-07-26 MED ORDER — OXYCODONE HCL 5 MG PO TABS: 5.0000 mg | ORAL_TABLET | Freq: Four times a day (QID) | ORAL | 0 refills | Status: DC | PRN

## 2023-07-26 MED ORDER — APIXABAN 2.5 MG PO TABS: 2.5000 mg | ORAL_TABLET | Freq: Two times a day (BID) | ORAL | Status: AC

## 2023-07-26 MED ORDER — PANTOPRAZOLE SODIUM 40 MG PO TBEC
40.0000 mg | DELAYED_RELEASE_TABLET | Freq: Two times a day (BID) | ORAL | 0 refills | Status: AC
  Filled 2023-07-26: qty 60, 30d supply, fill #0

## 2023-07-26 MED ORDER — VANCOMYCIN HCL 750 MG/150ML IV SOLN: 750.0000 mg | INTRAVENOUS | Status: AC

## 2023-07-26 MED ORDER — SEVELAMER CARBONATE 800 MG PO TABS: 1600.0000 mg | ORAL_TABLET | Freq: Three times a day (TID) | ORAL | Status: AC

## 2023-07-26 MED ORDER — DICLOFENAC SODIUM 1 % EX GEL: 2.0000 g | Freq: Three times a day (TID) | CUTANEOUS | Status: AC | PRN

## 2023-07-26 NOTE — Progress Notes (Signed)
 Case discussed with CSW. Pt is now for snf placement. Update provided to nephrologist,. Also contacted [facility] to be advised that pt will not be starting on Monday due to plans for pt to now d/c to snf. Clinic will be unable to hold appt for pt very long and CSW made family aware of this info. Will assist as needed.   Lauraine Polite Renal Navigator [phone]

## 2023-07-26 NOTE — Progress Notes (Signed)
 PROGRESS NOTE   Subjective/Complaints: No new complaints this morning Somnolent Appreciate nephrology follow-up  Flector  patch added for right knee pain  ROS: +upper and lower extremity weakness- improved, +bowel incontinence, +right knee pain-limits therapy, continues, but is betterr. Denies CP, SOB, abd pain, n/v/d/c, +dizziness- improved   Objective:   CT ABDOMEN PELVIS WO CONTRAST Result Date: 07/24/2023 CLINICAL DATA:  Anemia EXAM: CT ABDOMEN AND PELVIS WITHOUT CONTRAST TECHNIQUE: Multidetector CT imaging of the abdomen and pelvis was performed following the standard protocol without IV contrast. RADIATION DOSE REDUCTION: This exam was performed according to the departmental dose-optimization program which includes automated exposure control, adjustment of the mA and/or kV according to patient size and/or use of iterative reconstruction technique. COMPARISON:  CT abdomen and pelvis 04/16/2023. FINDINGS: Lower chest: There are calcified granulomas in the right lung base. Hepatobiliary: No focal liver abnormality is seen. Status post cholecystectomy. No biliary dilatation. Pancreas: Unremarkable. No pancreatic ductal dilatation or surrounding inflammatory changes. Spleen: Normal in size without focal abnormality. Adrenals/Urinary Tract: There is scattered rounded hypodense areas throughout both kidneys which are too small to characterize, likely cysts. There is no hydronephrosis or perinephric fluid. No urinary tract calculi are seen. The adrenal glands and bladder are within normal limits. Stomach/Bowel: Small hiatal hernia is present. Stomach is within normal limits. No evidence of bowel wall thickening, distention, or inflammatory changes. There are postsurgical changes in the right colon. The appendix is not seen. There are scattered colonic diverticula. There is a large amount of stool throughout the entire colon. Vascular/Lymphatic:  Aortic atherosclerosis. No enlarged abdominal or pelvic lymph nodes. Reproductive: Status post hysterectomy. No adnexal masses. Other: There is right breast skin thickening with calcifications and surgical clips as well as edema throughout the right breast. This was noted on the prior examination. Skin thickening has slightly decreased. There is no ascites or focal abdominal wall hernia. Musculoskeletal: There is heterogeneous sclerosis throughout the spine similar to the prior study. IMPRESSION: 1. No acute localizing process in the abdomen or pelvis. 2. Large amount of stool throughout the colon compatible with constipation. 3. Colonic diverticulosis without evidence for diverticulitis. 4. Stable heterogeneous sclerosis throughout the spine. 5. Mild decrease in right breast skin thickening with calcifications and surgical clips. Correlate clinically. 6. Aortic atherosclerosis. Aortic Atherosclerosis (ICD10-I70.0). Electronically Signed   By: Tyron Gallon M.D.   On: 07/24/2023 20:10      Recent Labs    07/24/23 0827 07/25/23 0655  WBC 12.3* 11.0*  HGB 7.3* 7.3*  HCT 24.6* 24.4*  PLT 394 370    Recent Labs    07/24/23 0827  NA 136  K 4.8  CL 98  CO2 25  GLUCOSE 164*  BUN 62*  CREATININE 6.46*  CALCIUM  9.7     Intake/Output Summary (Last 24 hours) at 07/26/2023 1038 Last data filed at 07/26/2023 0800 Gross per 24 hour  Intake 250 ml  Output --  Net 250 ml        Physical Exam: Vital Signs Blood pressure (!) 121/54, pulse 83, temperature 98.9 F (37.2 C), temperature source Oral, resp. rate 17, height (P) 5\' 4"  (1.626 m), weight 73 kg, SpO2 96%.  Gen:  no distress, normal appearing, resting in bed.  HEENT: oral mucosa pink and moist, NCAT Cardio: Reg rate, no m/r/g appreciated Chest: normal effort, normal rate of breathing, CTAB Abd: soft, non-distended, +BS throughout, nonTTP Ext/MsK: no edema peripherally, LUE with graft site at L elbow without significant erythema.  R  knee wrapped in bulky dressing and ACE wrap. L knee with less tenderness and no effusion.  Psychiatric:     Comments: Pt flat, cooperative, but brighter affect this weekend Musculoskeletal:        General: Swelling and tenderness (left antecubital region) present.  Right knee swollen and tender    Cervical back: Normal range of motion.  Skin:    Comments: AVF site CDI. No erythema. Right heel boggy  Neurological:     Mental Status: She is alert.     Comments: Oriented to self only. Unable to recall age, DOB or place. Poor recall and decrease insight into deficits. She was able to follow simple motor commands. No CN deficits. Limited RUE d/t HD catheter. LUE limited d/t pain. Grip/wrist at least 3-4/5 bilaterally. BLE 3- to 3/5 HF, KE and 4/5 ADF/PF. Decreased LT from ankles down bilaterally. No abnl resting tone. DTR's tr to 1+, stable 5/9 Functional mobility: modA toileting     Assessment/Plan: 1. Functional deficits which require 3+ hours per day of interdisciplinary therapy in a comprehensive inpatient rehab setting. Physiatrist is providing close team supervision and 24 hour management of active medical problems listed below. Physiatrist and rehab team continue to assess barriers to discharge/monitor patient progress toward functional and medical goals  Care Tool:  Bathing    Body parts bathed by patient: Right arm, Left arm, Chest, Abdomen, Front perineal area, Right upper leg, Left upper leg, Right lower leg, Left lower leg, Face   Body parts bathed by helper: Buttocks     Bathing assist Assist Level: Minimal Assistance - Patient > 75%     Upper Body Dressing/Undressing Upper body dressing   What is the patient wearing?: Pull over shirt, Bra    Upper body assist Assist Level: Supervision/Verbal cueing    Lower Body Dressing/Undressing Lower body dressing      What is the patient wearing?: Pants     Lower body assist Assist for lower body dressing: Contact  Guard/Touching assist     Toileting Toileting    Toileting assist Assist for toileting: Moderate Assistance - Patient 50 - 74%     Transfers Chair/bed transfer  Transfers assist     Chair/bed transfer assist level: Contact Guard/Touching assist     Locomotion Ambulation   Ambulation assist      Assist level: Minimal Assistance - Patient > 75% Assistive device: Walker-rolling Max distance: 20ft   Walk 10 feet activity   Assist  Walk 10 feet activity did not occur: Safety/medical concerns  Assist level: Minimal Assistance - Patient > 75% Assistive device: Walker-rolling   Walk 50 feet activity   Assist Walk 50 feet with 2 turns activity did not occur: Safety/medical concerns (pain)         Walk 150 feet activity   Assist Walk 150 feet activity did not occur: Safety/medical concerns (pain)         Walk 10 feet on uneven surface  activity   Assist Walk 10 feet on uneven surfaces activity did not occur: Safety/medical concerns (pain)         Wheelchair     Assist Is the patient using a wheelchair?: Yes Type of  Wheelchair: Manual Engineer, agricultural)    Wheelchair assist level: Supervision/Verbal cueing Max wheelchair distance: 163ft    Wheelchair 50 feet with 2 turns activity    Assist        Assist Level: Supervision/Verbal cueing   Wheelchair 150 feet activity     Assist      Assist Level: Supervision/Verbal cueing   Blood pressure (!) 121/54, pulse 83, temperature 98.9 F (37.2 C), temperature source Oral, resp. rate 17, height (P) 5\' 4"  (1.626 m), weight 73 kg, SpO2 96%.  Medical Problem List and Plan: 1. Functional deficits secondary to debility after acute respiratory failure and multiple medical issues             -patient may not yet shower             -ELOS/Goals: 10-14 days, supervision goals with PT, OT, SLP  Updated Josephus Nida (daughter)  -continue CIR  Grounds pass ordered  Decrease to 15/7  Planning for  SNF 2.  Antithrombotics: -DVT/anticoagulation:  Pharmaceutical: Eliquis  reduced to 2.5mg  BID as per GI recommendations given positive stool occult. (for renal artery thrombosis)             -antiplatelet therapy: N/A 3. Acute on chronic LBP/Pain Management: Continue Hydrocodone  prn severe pain--monitor for sedation/confusion.              --Was on IV dilaudid  till 04/16-->now on hydrocodone  10 TID on average.              --increased use of Zanaflex  in past 48 hours.              --continue Gabapentin  300 mg/hs 4. Hx of depression/anxiety/Sleep: LCSW to follow for evaluation and support             --melatonin prn for insomnia.              -antipsychotic agents: N/A             -pt on chronic prozac  20mg  daily and remeron  7.5mg  nightly 5. Neuropsych/cognition: This patient is not capable of making decisions on her own behalf. 6. Skin/Wound Care: Routine pressure relief measures.              -float both heels 7. Fluids/Electrolytes/Nutrition: Strict I/O. Labs with HD             --1200 cc FR/day.  8. Cellulitis/Possible abscess LUE: On Van since 04/19 and Zosyn  since 04/17 --WBC trending down to 10.5 today. BC X 2 from 04/19 pending--negative so far.  --has been afebrile X 24 hours.  Zosyn  changed to Augmentin  by Dr. Sunnie England. End date for Vanc/augmentin  is 4/29              -pt reports that arm is feeling less tender -07/14/23 no fevers but now worsening leukocytosis, WBC 19.7 yesterday and 20.4 on repeat today, LUE not really that concerning looking -continue vancomycin /ceftriaxone  -SEE BELOW #21-22  9. H/o GIB: Monitor H/H as had further drop. Hgb down to             On IV iron /ESA  -07/21/23 Hgb stable 7-8 range  10. HTN: Monitor BP TID--on Torsemide  on non dialysis days.  -07/20/23 [name] stable, apparently a little soft during dialysis, may need midodrine  on dialysis days per nephro; monitor -07/21/23 [name] fine, even a little borderline high at times; monitor  Vitals:   07/24/23 1030  07/24/23 1100 07/24/23 1130 07/24/23 1134  BP: (!) 145/56 (!) 165/53 (!) 144/62 (!) 162/64  07/24/23 1139 07/24/23 1232 07/24/23 1659 07/24/23 2050  BP: (!) 147/69 (!) 167/62 (!) 147/67 (!) 96/52   07/25/23 0544 07/25/23 1448 07/25/23 1954 07/26/23 0447  BP: (!) 152/61 115/63 98/88 (!) 121/54     11. 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 H/o Gastritis/esophagitis: Most recent admission last month. Continue Protonix  40mg  BID.  --Has required one unit PRBC 04/16.              --monitor for signs of bleeding.   13. Chronic diarrhea:  continue Colestid  1g TID and Creon  36kU tid- changed to after meals as per daughter's request, last BM 5/2  14. H/o cognitive/memory deficits: continue Exelon  1.5mg  bid. Namenda  added  15. ESRD: Now HD dependent--TTS at the end of the day to help with tolerance of therapy             --continue renveal for metabolic bone disease.  Per nephro  -07/20/23 dialysis today, then MWF next week to facilitate d/c  17. T2DM: Hgb A1c- 6.2 and diet controlled. Add CM restrictions.  -d/c ISS, d/c CBGs  18. Bilateral knee pain: flector  patch ordered for right knee, right knee sleeve ordered, continue  -now s/p b/l aspirations by [name]  19. Bowel incontinence: lumbar MRI showed and discussed there is no evidence of cauda equina  20. New upper extremity weakness: cervical MRI ordered and discussed that there is moderate canal stenosis, discussed with daughter that oral steroids, steroid injection, surgery are possible interventions- deferred at this time given patient's risk factors  21. Right knee effusion: culture reviewed and with 89,000 WBC concerning for septic arthritis, will consult ID, procal elevated, s/p ortho washout, continue ceftriaxone /vanc -per ID, continue vanc, Fortaz  await culture results-- tx 4wks from I&D (4/28)   -07/21/23 intra-op cx NGTD x2d; both cx from knee aspirations NGTD x3d  22. Leukocytosis:repeat CBC ordered,  procal ordered, consulted ID, continue ceftriaxone /vanc Intra-op cultures reviewed and with NGTD  23. Anemia: Hgb reviewed and is stable, CT reviewed and there is no evidence of bleed, Eliquis  reduced to 2.5mg  BID as per GI recommendations  24. Dementia: added namenda  5mg  daily, continue  25. Neuropathic pain: added namenda  5mg  daily, metanx added, discussed with daughter, continue  62. Hypotension: will d/c tizanidine   LOS: 18 days A FACE TO FACE EVALUATION WAS PERFORMED  Keven Pel Jasmin Trumbull 07/26/2023, 10:38 AM

## 2023-07-26 NOTE — Progress Notes (Signed)
 Occupational Therapy Session Note  Patient Details  Name: Boni Swaziland Ballard MRN: [id number] Date of Birth: 1945/10/23  Today's Date: 07/26/2023 OT Individual Time: 1030-1130 OT Individual Time Calculation (min): 60 min    Short Term Goals: Week 3:  OT Short Term Goal 1 (Week 3): STG = LTG due to ELOS  Skilled Therapeutic Interventions/Progress Updates:    Pt greeted seated in wc and agreeable to OT treatment session. Pt reported she wanted to go to the bathroom. Stand-pivot with heavy use of grab bar and CGA. MiN A for clothing management. Pt unable to void of have a BM.Aaron Aas Pt cleansed bottom using hip hike with set-up A. Pt then pivoted back to wc with CGA. Pt completed hand washing task from wc at the sink with set-up A. Pt brought outside in wc for mood boost and change of scenery. While outside, pt completed 3 sets of 10 chest pull, triceps press, side arm raise, and straight arm raise with OT assist for ROM of L shoulder-using yellow theraband. Rest breaks in between sets. Pt returned to room at end of session and left seated in wc with alarm on, call bell in reach, and needs met.   Therapy Documentation Precautions:  Precautions Precautions: Fall Precaution/Restrictions Comments: dementia Restrictions Weight Bearing Restrictions Per Provider Order: No Pain: Pain Assessment Pain Scale: 0-10 Pain Score: 8  Pain Location: Leg Pain Intervention(s): Medication (See eMAR)     Therapy/Group: Individual Therapy  Lethia Raveling 07/26/2023, 10:58 AM

## 2023-07-26 NOTE — Progress Notes (Signed)
 Patient ID: Ayjah Swaziland Yarbough, female   DOB: 1945-08-21, 78 y.o.   MRN: [id number] [name] Progress Note   Assessment/ Plan:   1.  Functional debility/deficits status post acute hypoxic respiratory failure/uremia: Currently admitted to inpatient rehabilitation unit for intensive physical therapy and Occupational Therapy with which she is making progress and plans to DC tomorrow. 2.  New start ESRD: With recent frequent hospitalizations for volume overload responding to intravenous diuretics but currently at a point where she needed to start dialysis with worsening uremia/volume excess.  Ongoing hemodialysis on MWF schedule this week with plans to discharge home on tomorrow after HD today and begin dialysis at [facility] on Monday, 07/29/2023. 3. Anemia: With recent EGD that did not show any evidence of bleeding.  On chronic Eliquis  for history of renal vein thrombosis-dose adjusted to 2.5 mg twice daily.  CT scan of the abdomen and pelvis did not show any significant lesion (but showed evidence of bowel distention from constipation) 4. CKD-MBD: Calcium  and phosphorus levels currently at goal, continue to monitor on renal diet and sevelamer .  PTH at goal. 5. Nutrition: Continue renal diet with fluid restriction and ongoing protein supplementation. 6. Hypertension: Blood pressures currently elevated, continue to monitor with hemodialysis/UF.  Subjective:   No acute issues noted overnight   Objective:   BP (!) 121/54 (BP Location: Left Leg)   Pulse 83   Temp 98.9 F (37.2 C) (Oral)   Resp 17   Ht (P) 5\' 4"  (1.626 m)   Wt 73 kg   SpO2 96%   BMI (P) 27.62 kg/m   Physical Exam: Gen: Sitting comfortably in her wheelchair, alert/oriented CVS: Pulse regular rhythm, normal rate, S1 and S2 normal Resp: Clear to auscultation bilaterally, no distinct rales or rhonchi.  Right IJ TDC in place Abd: Soft, flat, nontender, bowel sounds normal Ext: Trace ankle edema.   Left upper arm AV graft excised.  Labs: BMET Recent Labs  Lab 07/20/23 0553 07/22/23 1530 07/24/23 0827  NA 134* 137 136  K 4.4 5.2* 4.8  CL 92* 97* 98  CO2 22 24 25   GLUCOSE 143* 162* 164*  BUN 63* 69* 62*  CREATININE 6.15* 6.81* 6.46*  CALCIUM  9.9 9.7 9.7  PHOS  --  3.1 3.8   CBC Recent Labs  Lab 07/21/23 0726 07/22/23 1044 07/24/23 0827 07/25/23 0655  WBC 13.1* 12.0* 12.3* 11.0*  NEUTROABS  --  9.0*  --  8.8*  HGB 7.7* 8.0* 7.3* 7.3*  HCT 24.9* 26.7* 24.6* 24.4*  MCV 92.9 94.0 95.7 94.2  PLT 419* 425* 394 370      Medications:     apixaban   2.5 mg Oral BID   B-complex with vitamin C  1 tablet Oral Daily   Chlorhexidine  Gluconate Cloth  6 each Topical Q12H   cholecalciferol   1,000 Units Oral Daily   colestipol   1 g Oral TID AC   darbepoetin (ARANESP ) injection - DIALYSIS  200 mcg Subcutaneous Q Sat-1800   diclofenac   1 patch Transdermal BID   FLUoxetine   20 mg Oral Daily   gabapentin   300 mg Oral QHS   kidney failure book   Does not apply Once   l-methylfolate-B6-B12  1 tablet Oral Daily   lipase/protease/amylase  36,000 Units Oral TID AC   memantine   5 mg Oral Daily   mirtazapine   7.5 mg Oral QHS   multivitamin  1 tablet Oral QHS   pantoprazole   40 mg Oral BID  rivastigmine   1.5 mg Oral BID   senna  2 tablet Oral Daily   sevelamer  carbonate  1,600 mg Oral TID WC   sodium chloride  flush  10-40 mL Intracatheter Q12H   torsemide   100 mg Oral Once per day on Sunday Tuesday Thursday Saturday   Clevester Dally, MD 07/26/2023, 9:15 AM

## 2023-07-26 NOTE — Progress Notes (Addendum)
 Patient ID: [name], female   DOB: 12/09/1945, 78 y.o.   MRN: [id number]  1115- SW spoke with pt dtr Josephus Nida to follow-up about family edu yesterday and decision. Family would like to move forward with SNF placement. SW will SNF list based on [employer] insurance plan. SW will follow-up on Monday with updates.   *SW left SNF list in room.   Medical team,and dialysis cooed-Tracy aware on current plan for SNF.   SW spoke with Star/Admissions with [facility] and waiting on follow-up on if able to extend bed offer.    SW received updates from Sara [name] with [facility] reporting concerns about medication- Arensp and asked if pt family can bring in.   1418- SW spoke with pt dtr [name] to discuss above, and additional preferred SNF-[facility]. Location. Clarification about medication reports this is a new med that can be administered at dialysis. Pt med will switch to mirceca. SW shared will follow-up once more information.   SW received updates from dialysis coord that current dialysis location may not be an option but will confirm.   *SW shared above with Star/Admissions and she reports bed offer extended. SW will submit insurance auth. SW shared with dialysis coord and asked if clinic can hold until Wednesday if [facility] is granted as of Monday. SW will follow-up.   1629-SW submitting insurance auth to [facility] (p:[phone]/fax#:[phone]; ref/auth#[id number]) to submit SNF auth. Will need clinicals faxed within the hour.   SW faxed requested clinicals.  Norval Been, MSW, LCSW Office: [phone] Cell: [phone] Fax: [phone]

## 2023-07-26 NOTE — Plan of Care (Signed)
  Problem: RH Stairs Goal: LTG Patient will ambulate up and down stairs w/assist (PT) Description: LTG: Patient will ambulate up and down # of stairs with assistance (PT) Flowsheets (Taken 07/26/2023 0844) LTG: Pt will ambulate up/down stairs assist needed:: Contact Guard/Touching assist LTG: Pt will  ambulate up and down number of stairs: 1 3in step without rails

## 2023-07-26 NOTE — Progress Notes (Signed)
 Occupational Therapy Session Note  Patient Details  Name: [name] MRN: [id number] Date of Birth: Jul 21, 1945  {CHL IP REHAB OT TIME CALCULATIONS:304400400}   Short Term Goals: Week 3:  OT Short Term Goal 1 (Week 3): STG = LTG due to ELOS  Skilled Therapeutic Interventions/Progress Updates:      Therapy Documentation Precautions:  Precautions Precautions: Fall Precaution/Restrictions Comments: dementia Restrictions Weight Bearing Restrictions Per Provider Order: No   Therapy/Group: Individual Therapy  Artemus Biles, OTR/L, MSOT  07/26/2023, 9:54 PM

## 2023-07-26 NOTE — Plan of Care (Signed)
  Problem: RH BOWEL ELIMINATION Goal: RH STG MANAGE BOWEL WITH ASSISTANCE Description: STG Manage Bowel with mod I Assistance. Outcome: Progressing Goal: RH STG MANAGE BOWEL W/MEDICATION W/ASSISTANCE Description: STG Manage Bowel with Medication with mod I Assistance. Outcome: Progressing   Problem: RH SAFETY Goal: RH STG ADHERE TO SAFETY PRECAUTIONS W/ASSISTANCE/DEVICE Description: STG Adhere to Safety Precautions With cues Assistance/Device. Outcome: Progressing   Problem: RH PAIN MANAGEMENT Goal: RH STG PAIN MANAGED AT OR BELOW PT'S PAIN GOAL Description: < 4 with prns Outcome: Progressing   Problem: RH KNOWLEDGE DEFICIT GENERAL Goal: RH STG INCREASE KNOWLEDGE OF SELF CARE AFTER HOSPITALIZATION Description: Patient and family will be able to manage care at discharge using educational resources for medications, dietary modification recommendations independently Outcome: Progressing   Problem: Fluid Volume: Goal: Compliance with measures to maintain balanced fluid volume will improve Description: Patient and family will be able to manage care at discharge using educational resources for 1200 cc fluid restriction/ dietary modification recommendations independently Outcome: Progressing   Problem: Health Behavior/Discharge Planning: Goal: Ability to manage health-related needs will improve Outcome: Progressing   Problem: Nutritional: Goal: Ability to make healthy dietary choices will improve Description: Patient and family will be able to manage care at discharge using educational resources for ESRD/HD dietary modification recommendations independently Outcome: Progressing   Problem: Clinical Measurements: Goal: Complications related to the disease process, condition or treatment will be avoided or minimized Description: Patient and family will be able to manage care at discharge using educational resources for medications, dietary modification recommendations  independently Outcome: Progressing   Problem: Education: Goal: Knowledge of disease and its progression will improve Outcome: Progressing Goal: Individualized Educational Video(s) Outcome: Progressing   Problem: Education: Goal: Knowledge of General Education information will improve Description: Including pain rating scale, medication(s)/side effects and non-pharmacologic comfort measures Outcome: Progressing   Problem: Health Behavior/Discharge Planning: Goal: Ability to manage health-related needs will improve Outcome: Progressing   Problem: Clinical Measurements: Goal: Ability to maintain clinical measurements within normal limits will improve Outcome: Progressing Goal: Will remain free from infection Outcome: Progressing Goal: Diagnostic test results will improve Outcome: Progressing Goal: Respiratory complications will improve Outcome: Progressing Goal: Cardiovascular complication will be avoided Outcome: Progressing   Problem: Activity: Goal: Risk for activity intolerance will decrease Outcome: Progressing

## 2023-07-26 NOTE — Anesthesia Postprocedure Evaluation (Signed)
 Anesthesia Post Note  Patient: [name]  Procedure(s) Performed: IRRIGATION AND DEBRIDEMENT KNEE (Right: Knee)     Patient location during evaluation: PACU Anesthesia Type: General Level of consciousness: patient cooperative Pain management: pain level controlled Vital Signs Assessment: post-procedure vital signs reviewed and stable Respiratory status: spontaneous breathing, nonlabored ventilation and respiratory function stable Cardiovascular status: blood pressure returned to baseline and stable Postop Assessment: no apparent nausea or vomiting Anesthetic complications: no   No notable events documented.                  Rockey Guarino

## 2023-07-26 NOTE — Progress Notes (Shared)
 Physical Therapy Discharge Summary  Patient Details  Name: [name] MRN: [id number] Date of Birth: 08/21/1945  Date of Discharge from PT service:{Time; dates multiple:304500300}  Patient has met {NUMBERS 0-12:18577} of 10 long term goals due to ability to compensate for deficits.  Patient to discharge at a wheelchair level Min Assist. Patient's care partner unavailable to provide the necessary physical and cognitive assistance at discharge.  Reasons goals not met: bilateral knee pain (R>L), generalized weakness/deconditioning, decreased standing tolerance/balance, and cognitive impairments due to baseline dementia.  Recommendation:  Patient will benefit from ongoing skilled PT services in skilled nursing facility setting to continue to advance safe functional mobility, address ongoing impairments in transfers, generalized strengthening and endurance, dynamic standing balance/coordination, and to minimize fall risk.  Equipment: No equipment provided - to be provided at next venue of care  Reasons for discharge: lack of progress toward goals, treatment goals met, and discharge from hospital  Patient/family agrees with progress made and goals achieved: No  PT Discharge Precautions/Restrictions Precautions Precautions: Fall Precaution/Restrictions Comments: dementia Restrictions Weight Bearing Restrictions Per Provider Order: No Pain Interference Pain Interference Pain Effect on Sleep: 1. Rarely or not at all (question accurracy of answers due to dementia) Pain Interference with Therapy Activities: 1. Rarely or not at all (question accurracy of answers due to dementia) Pain Interference with Day-to-Day Activities: 1. Rarely or not at all (question accurracy of answers due to dementia) Sensation Sensation Light Touch: Appears Intact Hot/Cold: Not tested Proprioception: Appears Intact Stereognosis: Not tested Coordination Gross Motor Movements are Fluid and Coordinated:  No Fine Motor Movements are Fluid and Coordinated: No Coordination and Movement Description: uncoordinated due to pain, impaired motor planning/sequencing, and weakness/deconditioning Finger Nose Finger Test: dysmetria bilaterally Heel Shin Test: uncoordinated and decreased ROM bilaterally Motor  Motor Motor: Abnormal postural alignment and control;Other (comment) Motor - Skilled Clinical Observations: uncoordinated due to pain, impaired motor planning/sequencing, and weakness/deconditioning  Mobility Bed Mobility Bed Mobility: Rolling Right;Rolling Left;Sit to Supine;Supine to Sit Rolling Right: Independent with assistive device Rolling Left: Independent with assistive device Supine to Sit: Independent with assistive device Sit to Supine: Independent with assistive device Transfers Transfers: Sit to Stand;Stand to Sit;Stand Pivot Transfers Sit to Stand: Minimal Assistance - Patient > 75% Stand to Sit: Contact Guard/Touching assist Stand Pivot Transfers: Contact Guard/Touching assist Stand Pivot Transfer Details: Verbal cues for sequencing;Verbal cues for technique;Verbal cues for safe use of DME/AE Stand Pivot Transfer Details (indicate cue type and reason): verbal cues for sequencing and frequently reminding pt where she is going Transfer (Assistive device): Rolling walker Locomotion  Gait Ambulation: Yes Gait Assistance: Minimal Assistance - Patient > 75% Gait Distance (Feet): 15 Feet Assistive device: Rolling walker Gait Assistance Details: Verbal cues for gait pattern;Verbal cues for technique;Verbal cues for sequencing;Verbal cues for safe use of DME/AE Gait Assistance Details: verbal cues for upright posture, knee extension, and RW safety Gait Gait: Yes Gait Pattern: Impaired Gait Pattern: Step-to pattern;Decreased step length - right;Decreased step length - left;Decreased stride length;Antalgic;Trunk flexed;Poor foot clearance - left;Poor foot clearance - right;Narrow base  of support Gait velocity: decreased Stairs / Additional Locomotion Stairs: Yes Stairs Assistance: Minimal Assistance - Patient > 75% Stair Management Technique: With walker Number of Stairs: 1 Height of Stairs: 3 Curb: Minimal Assistance - Patient >75% (RW) Wheelchair Mobility Wheelchair Mobility: Yes Wheelchair Assistance: Doctor, general practice: Both upper extremities Wheelchair Parts Management: Needs assistance Distance: 127ft  Trunk/Postural Assessment  Cervical Assessment Cervical Assessment: Exceptions to [facility] (forward head)  Thoracic Assessment Thoracic Assessment: Exceptions to [facility] (thoracic rounding) Lumbar Assessment Lumbar Assessment: Exceptions to [facility] (posterior pelvic tilt) Postural Control Postural Control: Deficits on evaluation Righting Reactions: delayed Protective Responses: delayed  Balance Balance Balance Assessed: Yes Static Sitting Balance Static Sitting - Balance Support: Feet supported;Bilateral upper extremity supported Static Sitting - Level of Assistance: 6: Modified independent (Device/Increase time) Dynamic Sitting Balance Dynamic Sitting - Balance Support: Feet supported;No upper extremity supported Dynamic Sitting - Level of Assistance: 5: Stand by assistance (supervision) Static Standing Balance Static Standing - Balance Support: During functional activity;Bilateral upper extremity supported (RW) Static Standing - Level of Assistance: 5: Stand by assistance (CGA) Dynamic Standing Balance Dynamic Standing - Balance Support: Bilateral upper extremity supported;During functional activity (RW) Dynamic Standing - Level of Assistance: 5: Stand by assistance (CGA) Dynamic Standing - Comments: with transfers Extremity Assessment  RLE Assessment RLE Assessment: Exceptions to [facility] General Strength Comments: tested sitting in WC - limited by pain in R knee RLE Strength Right Hip Flexion: 3-/5 Right Hip ABduction: 3+/5 Right  Hip ADduction: 3+/5 Right Knee Flexion: 3+/5 Right Knee Extension: 3-/5 Right Ankle Dorsiflexion: 3+/5 Right Ankle Plantar Flexion: 3+/5 LLE Assessment LLE Assessment: Exceptions to [facility] General Strength Comments: tested sitting in WC LLE Strength Left Hip Flexion: 3+/5 Left Hip ABduction: 3+/5 Left Hip ADduction: 3+/5 Left Knee Flexion: 3+/5 Left Knee Extension: 3-/5 Left Ankle Dorsiflexion: 4-/5 Left Ankle Plantar Flexion: 4-/5   [name] Nena Bank PT, DPT 07/26/2023, 7:15 AM

## 2023-07-26 NOTE — Progress Notes (Signed)
 Physical Therapy Session Note  Patient Details  Name: [name] MRN: [id number] Date of Birth: May 01, 1945  Today's Date: 07/26/2023 PT Individual Time: 0731-0839 PT Individual Time Calculation (min): 68 min   Short Term Goals: Week 1:  PT Short Term Goal 1 (Week 1): pt will transfer bed<>chair with LRAD and CGA PT Short Term Goal 1 - Progress (Week 1): Progressing toward goal PT Short Term Goal 2 (Week 1): pt will transfer sit<>stand with LRAD and CGA PT Short Term Goal 2 - Progress (Week 1): Progressing toward goal PT Short Term Goal 3 (Week 1): pt will ambulate 52ft with LRAD and CGA PT Short Term Goal 3 - Progress (Week 1): Progressing toward goal Week 2:  PT Short Term Goal 1 (Week 2): STG=LTG due to LOS  Skilled Therapeutic Interventions/Progress Updates:   Received pt supine in bed asleep. Pt required increased time to arouse but agreeable to PT treatment. Pt reported pain "6 or 8"/10 in R knee - RN notified of request for pain medication. Session with emphasis on functional mobility/transfers, dressing, generalized strengthening and endurance, dynamic standing balance/coordination, and ambulation. Pt transferred supine<>sitting R EOB from flat bed using bedrails with mod I and increased time. Donned socks, R knee sleeve, and shoes with max A and pants with supervision, increased time, and cues to thread RLE through first. Removed nightgown and donned pull over shirt with min A. Laboratory present to draw blood, then stood from low sitting EOB with RW and min A and required max A to pull pants over hips. Performed stand<>pivot into WC with RW and CGA/min A - pt still with difficulty extending knees and standing upright. Sat in WC at sink and washed face and applied lotions with set up assist.   Went through sensation, MMT, and pain interference questionnaire in preparation for discharge. Pt performed all transfers with RW and min A throughout session. Pt transported to/from room in  [facility] dependently for time management purposes. Pt performed seated R knee AAROM 3x10, R hip flexion 3x10, and LAQ 3x10 while RN administered medication with emphasis on R knee ROM. Pt then ambulated 11ft with RW and min A. Pt ambulates with antalgic gait pattern, bilateral knee "crouching"/flexion, and required max cues for upright posture and to advance RW as pt holding it too close to her with inability to take longer strides. Returned to room and concluded session with pt sitting in TIS WC, needs within reach, and seatbelt alarm on. Set pt up to eat breakfast and heat pack placed on R knee for pain relief.    Therapy Documentation Precautions:  Precautions Precautions: Fall Precaution/Restrictions Comments: dementia Restrictions Weight Bearing Restrictions Per Provider Order: No  Therapy/Group: Individual Therapy [name] Nena Bank PT, DPT 07/26/2023, 6:54 AM

## 2023-07-27 NOTE — Progress Notes (Signed)
 Physical Therapy Session Note  Patient Details  Name: [name] MRN: [id number] Date of Birth: 1946/01/18  Today's Date: 07/27/2023 PT Individual Time: 2952-8413 PT Individual Time Calculation (min): 41 min   Short Term Goals: Week 2:  PT Short Term Goal 1 (Week 2): STG=LTG due to LOS  Skilled Therapeutic Interventions/Progress Updates:     Pt received seated in [facility] and agrees to therapy. Reports pain in Rt knee, chronic in nature. PT provides rest breaks and gentle mobility to manage pain. WC transport to gym for time management. Pt warms up with x20 alternating LAQs. Pt then performs sit to stand with minA and cues for hand placement. Pt ambulates x70' with RW and minA, with cues for upright gaze to improve posture and balance, and decreasing WB through RW for energy conservation. Following rest break, pt stands and performs rhythmic weight shifts in coordination with music, with alternating upper extremity support and for several seconds without upper extremity support. Following extended seated rest break, pt ambulates additional x30' with same assistance and cues. Pt left seated in WC with alarm intact and all needs within reach.   Therapy Documentation Precautions:  Precautions Precautions: Fall Precaution/Restrictions Comments: dementia Restrictions Weight Bearing Restrictions Per Provider Order: No   Therapy/Group: Individual Therapy  Neva Barban, PT, DPT 07/27/2023, 3:52 PM

## 2023-07-27 NOTE — Progress Notes (Addendum)
 Patient ID: [name], female   DOB: [birth year], 78 y.o.   MRN: [id number] [facility] Progress Note   Assessment/ Plan:   1.  Functional debility/deficits status post acute hypoxic respiratory failure/uremia: Currently admitted to inpatient rehabilitation unit for intensive physical therapy and Occupational Therapy.  Plans noted now for discharge to skilled nursing facility (which in turn may likely affect dialysis unit assignment). 2.  New start ESRD: With recent frequent hospitalizations for volume overload responding to intravenous diuretics but currently at a point where she needed to start dialysis with worsening uremia/volume excess.  Ongoing hemodialysis on MWF schedule via [facility] (placed 06/21/2023 by Dr. Vikki Graves) and with previous plans to begin dialysis at [facility] kidney center on Monday, 07/29/2023; these have been affected by her inability to discharge home and need to go to a skilled nursing facility.  She required excision of her left upper arm AV graft with vein patch angioplasty of left brachial artery after developing steal symptoms. 3. Anemia: With recent EGD that did not show any evidence of bleeding.  On chronic Eliquis  for history of renal vein thrombosis-dose adjusted to 2.5 mg twice daily.  Hemoglobin and hematocrit appear stable. 4. CKD-MBD: Calcium  and phosphorus levels currently at goal, continue to monitor on renal diet and sevelamer .  PTH at goal. 5. Nutrition: Continue renal diet with fluid restriction and ongoing protein supplementation. 6. Hypertension: Blood pressures currently elevated, continue to monitor with hemodialysis/UF.  Subjective:   No acute issues noted overnight following hemodialysis   Objective:   BP (!) 135/57 (BP Location: Right Wrist)   Pulse 72   Temp 98.5 F (36.9 C)   Resp 18   Ht (P) 5\' 4"  (1.626 m)   Wt 73 kg   SpO2 97%   BMI (P) 27.62 kg/m   Physical Exam: Gen: Resting comfortably in bed, OT at bedside CVS: Pulse  regular rhythm, normal rate, S1 and S2 normal Resp: Clear to auscultation bilaterally, no distinct rales or rhonchi.  Right IJ TDC in place Abd: Soft, flat, nontender, bowel sounds normal Ext: Trace ankle edema.  Left upper arm AV graft excised with intact/healthy scar.  Labs: BMET Recent Labs  Lab 07/22/23 1530 07/24/23 0827 07/26/23 1335  NA 137 136 136  K 5.2* 4.8 4.7  CL 97* 98 96*  CO2 24 25 24   GLUCOSE 162* 164* 153*  BUN 69* 62* 68*  CREATININE 6.81* 6.46* 6.90*  CALCIUM  9.7 9.7 9.6  PHOS 3.1 3.8 3.3   CBC Recent Labs  Lab 07/22/23 1044 07/24/23 0827 07/25/23 0655 07/26/23 1336  WBC 12.0* 12.3* 11.0* 11.8*  NEUTROABS 9.0*  --  8.8*  --   HGB 8.0* 7.3* 7.3* 7.4*  HCT 26.7* 24.6* 24.4* 25.2*  MCV 94.0 95.7 94.2 95.8  PLT 425* 394 370 395      Medications:     apixaban   2.5 mg Oral BID   B-complex with vitamin C  1 tablet Oral Daily   Chlorhexidine  Gluconate Cloth  6 each Topical Q12H   cholecalciferol   1,000 Units Oral Daily   colestipol   1 g Oral TID AC   darbepoetin (ARANESP ) injection - DIALYSIS  200 mcg Subcutaneous Q Sat-1800   diclofenac   1 patch Transdermal BID   FLUoxetine   20 mg Oral Daily   gabapentin   300 mg Oral QHS   kidney failure book   Does not apply Once   l-methylfolate-B6-B12  1 tablet Oral Daily   lipase/protease/amylase  36,000 Units Oral  TID AC   memantine   5 mg Oral Daily   mirtazapine   7.5 mg Oral QHS   multivitamin  1 tablet Oral QHS   pantoprazole   40 mg Oral BID   rivastigmine   1.5 mg Oral BID   senna  2 tablet Oral Daily   sevelamer  carbonate  1,600 mg Oral TID WC   sodium chloride  flush  10-40 mL Intracatheter Q12H   torsemide   100 mg Oral Once per day on Sunday Tuesday Thursday Saturday   Clevester Dally, MD 07/27/2023, 9:00 AM

## 2023-07-27 NOTE — Progress Notes (Signed)
 Intermittently throughout the day orientation waxing and waning between 3 to 4. Disoriented to situation and date. Expressing that today is Tuesday and earlier asked about hearing a dog barking on the unit. Increasingly forgetful today.

## 2023-07-27 NOTE — Progress Notes (Addendum)
 PROGRESS NOTE   Subjective/Complaints:  Pt doing well, slept well, pain overall well managed, LBM yesterday per pt but last documented on 5/8, urinating ok, had dialysis yesterday and that went well. Denies any other complaints or concerns.   ROS: +upper and lower extremity weakness- improved, +bowel incontinence, +right knee pain-limits therapy, continues, but is betterr. Denies CP, SOB, abd pain, n/v/d/c, +dizziness- improved   Objective:   No results found.     Recent Labs    07/25/23 0655 07/26/23 1336  WBC 11.0* 11.8*  HGB 7.3* 7.4*  HCT 24.4* 25.2*  PLT 370 395    Recent Labs    07/26/23 1335  NA 136  K 4.7  CL 96*  CO2 24  GLUCOSE 153*  BUN 68*  CREATININE 6.90*  CALCIUM  9.6     Intake/Output Summary (Last 24 hours) at 07/27/2023 1208 Last data filed at 07/27/2023 1013 Gross per 24 hour  Intake 268.43 ml  Output 1.2 ml  Net 267.23 ml        Physical Exam: Vital Signs Blood pressure (!) 135/57, pulse 72, temperature 98.5 F (36.9 C), resp. rate 18, height (P) 5\' 4"  (1.626 m), weight 73 kg, SpO2 97%.  Gen: no distress, normal appearing, sitting up in w/c eating breakfast  HEENT: oral mucosa pink and moist, NCAT Cardio: Reg rate, no m/r/g appreciated Chest: normal effort, normal rate of breathing, CTAB Abd: soft, non-distended, +BS throughout, nonTTP Ext/MsK: no edema peripherally, LUE with graft site at L elbow without significant erythema.  R knee with thin brace/sleeve. L knee with less tenderness and no effusion.  Psychiatric:     Comments: Pt flat, cooperative  PRIOR EXAMS: Musculoskeletal:        General: Swelling and tenderness (left antecubital region) present.  Right knee swollen and tender    Cervical back: Normal range of motion.  Skin:    Comments: AVF site CDI. No erythema. Right heel boggy  Neurological:     Mental Status: She is alert.     Comments: Oriented to self  only. Unable to recall age, DOB or place. Poor recall and decrease insight into deficits. She was able to follow simple motor commands. No CN deficits. Limited RUE d/t HD catheter. LUE limited d/t pain. Grip/wrist at least 3-4/5 bilaterally. BLE 3- to 3/5 HF, KE and 4/5 ADF/PF. Decreased LT from ankles down bilaterally. No abnl resting tone. DTR's tr to 1+, stable 5/9 Functional mobility: modA toileting     Assessment/Plan: 1. Functional deficits which require 3+ hours per day of interdisciplinary therapy in a comprehensive inpatient rehab setting. Physiatrist is providing close team supervision and 24 hour management of active medical problems listed below. Physiatrist and rehab team continue to assess barriers to discharge/monitor patient progress toward functional and medical goals  Care Tool:  Bathing    Body parts bathed by patient: Right arm, Left arm, Chest, Abdomen, Front perineal area, Right upper leg, Left upper leg, Right lower leg, Left lower leg, Face   Body parts bathed by helper: Buttocks     Bathing assist Assist Level: Minimal Assistance - Patient > 75%     Upper Body Dressing/Undressing Upper body dressing  What is the patient wearing?: Pull over shirt    Upper body assist Assist Level: Supervision/Verbal cueing    Lower Body Dressing/Undressing Lower body dressing      What is the patient wearing?: Pants     Lower body assist Assist for lower body dressing: Contact Guard/Touching assist     Toileting Toileting    Toileting assist Assist for toileting: Moderate Assistance - Patient 50 - 74%     Transfers Chair/bed transfer  Transfers assist     Chair/bed transfer assist level: Contact Guard/Touching assist     Locomotion Ambulation   Ambulation assist      Assist level: Minimal Assistance - Patient > 75% Assistive device: Walker-rolling Max distance: 64ft   Walk 10 feet activity   Assist  Walk 10 feet activity did not occur:  Safety/medical concerns  Assist level: Minimal Assistance - Patient > 75% Assistive device: Walker-rolling   Walk 50 feet activity   Assist Walk 50 feet with 2 turns activity did not occur: Safety/medical concerns (pain)         Walk 150 feet activity   Assist Walk 150 feet activity did not occur: Safety/medical concerns (pain)         Walk 10 feet on uneven surface  activity   Assist Walk 10 feet on uneven surfaces activity did not occur: Safety/medical concerns (pain)         Wheelchair     Assist Is the patient using a wheelchair?: Yes Type of Wheelchair: Manual (lightweight)    Wheelchair assist level: Supervision/Verbal cueing Max wheelchair distance: 16ft    Wheelchair 50 feet with 2 turns activity    Assist        Assist Level: Supervision/Verbal cueing   Wheelchair 150 feet activity     Assist      Assist Level: Supervision/Verbal cueing   Blood pressure (!) 135/57, pulse 72, temperature 98.5 F (36.9 C), resp. rate 18, height (P) 5\' 4"  (1.626 m), weight 73 kg, SpO2 97%.  Medical Problem List and Plan: 1. Functional deficits secondary to debility after acute respiratory failure and multiple medical issues             -patient may not yet shower             -ELOS/Goals: 10-14 days, supervision goals with PT, OT, SLP  Updated Josephus Nida (daughter)  -continue CIR  Grounds pass ordered  Decrease to 15/7  Planning for SNF 2.  Antithrombotics: -DVT/anticoagulation:  Pharmaceutical: Eliquis  reduced to 2.5mg  BID as per GI recommendations given positive stool occult. (for renal artery thrombosis)             -antiplatelet therapy: N/A 3. Acute on chronic LBP/Pain Management: Continue Hydrocodone  prn severe pain--monitor for sedation/confusion.              --Was on IV dilaudid  till 04/16-->now on hydrocodone  10 TID on average.              --increased use of Zanaflex  in past 48 hours.              --continue Gabapentin  300 mg/hs 4. Hx  of depression/anxiety/Sleep: LCSW to follow for evaluation and support             --melatonin prn for insomnia.              -antipsychotic agents: N/A             -pt on chronic prozac  20mg  daily and remeron   7.5mg  nightly 5. Neuropsych/cognition: This patient is not capable of making decisions on her own behalf. 6. Skin/Wound Care: Routine pressure relief measures.              -float both heels 7. Fluids/Electrolytes/Nutrition: Strict I/O. Labs with HD             --1200 cc FR/day.  8. Cellulitis/Possible abscess LUE: On Van since 04/19 and Zosyn  since 04/17 --WBC trending down to 10.5 today. BC X 2 from 04/19 pending--negative so far.  --has been afebrile X 24 hours.  Zosyn  changed to Augmentin  by Dr. Sunnie England. End date for Vanc/augmentin  is 4/29              -pt reports that arm is feeling less tender -07/14/23 no fevers but now worsening leukocytosis, WBC 19.7 yesterday and 20.4 on repeat today, LUE not really that concerning looking -continue vancomycin /ceftriaxone  -SEE BELOW #21-22  9. H/o GIB: Monitor H/H as had further drop. Hgb down to             On IV iron /ESA  -07/27/23 Hgb stable 7-8 range  10. HTN: Monitor BP TID--on Torsemide  on non dialysis days.  -07/20/23 [name] stable, apparently a little soft during dialysis, may need midodrine  on dialysis days per nephro; monitor -07/27/23 [name] fair, monitor  Vitals:   07/26/23 1330 07/26/23 1400 07/26/23 1430 07/26/23 1500  BP: 130/63 (!) 115/57 (!) 151/58 (!) 151/58   07/26/23 1530 07/26/23 1600 07/26/23 1630 07/26/23 1639  BP: (!) 139/54 (!) 153/61 (!) 133/59 (!) 141/62   07/26/23 1642 07/26/23 1746 07/26/23 2018 07/27/23 0524  BP: (!) 155/61 (!) 140/65 138/60 (!) 135/57     11. 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 H/o Gastritis/esophagitis: Most recent admission last month. Continue Protonix  40mg  BID.  --Has required one unit PRBC 04/16.              --monitor for signs of bleeding.   13. Chronic diarrhea:   continue Colestid  1g TID and Creon  36kU tid- changed to after meals as per daughter's request, last BM 5/2  14. H/o cognitive/memory deficits: continue Exelon  1.5mg  bid. Namenda  added  15. ESRD: Now HD dependent--TTS at the end of the day to help with tolerance of therapy             --continue renveal for metabolic bone disease.  Per nephro  -07/20/23 dialysis today, then MWF next week to facilitate d/c  -07/27/23 dialysis yesterday, cont this schedule for now  17. T2DM: Hgb A1c- 6.2 and diet controlled. Add CM restrictions.  -d/c ISS, d/c CBGs  18. Bilateral knee pain: flector  patch ordered for right knee, right knee sleeve ordered, continue  -now s/p b/l aspirations by [name]  19. Bowel incontinence: lumbar MRI showed and discussed there is no evidence of cauda equina  20. New upper extremity weakness: cervical MRI ordered and discussed that there is moderate canal stenosis, discussed with daughter that oral steroids, steroid injection, surgery are possible interventions- deferred at this time given patient's risk factors  21. Right knee effusion: culture reviewed and with 89,000 WBC concerning for septic arthritis, will consult ID, procal elevated, s/p ortho washout, continue ceftriaxone /vanc -per ID, continue vanc, Fortaz  await culture results-- tx 4wks from I&D (4/28)   -07/21/23 intra-op cx NGTD x2d; both cx from knee aspirations NGTD x3d  22. Leukocytosis:repeat CBC ordered, procal ordered, consulted ID, continue ceftriaxone /vanc Intra-op cultures reviewed and with NGTD final on 5/7 -07/27/23 WBCs  generally improving overall.   23. Anemia: Hgb reviewed and is stable, CT reviewed and there is no evidence of bleed, Eliquis  reduced to 2.5mg  BID as per GI recommendations -of note, CT A/P showed large colonic stool burden; LBM yesterday, monitor for now  24. Dementia: added namenda  5mg  daily, continue  25. Neuropathic pain: added namenda  5mg  daily, metanx added, discussed with  daughter, continue  15. Hypotension: will d/c tizanidine   LOS: 19 days A FACE TO FACE EVALUATION WAS PERFORMED  [address] 07/27/2023, 12:08 PM

## 2023-07-27 NOTE — Progress Notes (Signed)
 Occupational Therapy Session Note  Patient Details  Name: [name] MRN: [id number] Date of Birth: 06/22/1945  Today's Date: 07/27/2023 OT Individual Time: 1118-1200 OT Individual Time Calculation (min): 42 min    Short Term Goals: Week 3:  OT Short Term Goal 1 (Week 3): STG = LTG due to ELOS  Skilled Therapeutic Interventions/Progress Updates:     Pt received sitting up in w/c dressed and ready for the day upon OT arrival. Pt presenting to be in good spirits receptive to skilled OT session reporting 3/10 pain in R knee- OT offering intermittent rest breaks, repositioning, and therapeutic support to optimize participation in therapy session. Focused this session on activity tolerance and general strengthening for improved ADL participation Pt requesting not to stand during session d/t R knee pain so therapeutic activities and exercises completed at w/c level.  Engaged Pt in completing w/c propulsion to therapy gym, navigating around obstacles to work on w/c navigation skills and increase overall activity tolerance- pt able to complete ~50 ft with supervision +increased time and mod verbal cues for technique.  Engaged Pt in completed the following seated exercises with OT providing visual model and verbal cues for technique. Increased time provided for seated rest breaks and energy conservation: -Chest press self AAROM 3x10 reps -Bicep curls, 2# weighted dowel 3x10 reps -A/P ball passes, 2.2 #ball 3x10 reps  -Shoulder flexion AAROM 2x10 reps -Shoulder shrugs 2x10 reps -Posterior shoulder rolls 2x10 reps -Shoulder abduction 2x10 reps (assist provided for L UE) -Alternating inferior punches 2x30 reps  -Knee extension 3x10 reps each side -Seated marches 2x10 reps Pt completed w/c propulsion back to room with supervision- min verbal cues for technique and safety awareness. Pt was left resting in wc with call bell in reach, seatbelt alarm on, telesitter on, family sitter on, and all  needs met.    Therapy Documentation Precautions:  Precautions Precautions: Fall Precaution/Restrictions Comments: dementia Restrictions Weight Bearing Restrictions Per Provider Order: No   Therapy/Group: Individual Therapy  Geoffery Kiel 07/27/2023, 6:59 AM

## 2023-07-27 NOTE — Plan of Care (Signed)
  Problem: Consults Goal: RH GENERAL PATIENT EDUCATION Description: See Patient Education module for education specifics. Outcome: Progressing   Problem: RH BOWEL ELIMINATION Goal: RH STG MANAGE BOWEL WITH ASSISTANCE Description: STG Manage Bowel with mod I Assistance. Outcome: Progressing Goal: RH STG MANAGE BOWEL W/MEDICATION W/ASSISTANCE Description: STG Manage Bowel with Medication with mod I Assistance. Outcome: Progressing   Problem: RH SAFETY Goal: RH STG ADHERE TO SAFETY PRECAUTIONS W/ASSISTANCE/DEVICE Description: STG Adhere to Safety Precautions With cues Assistance/Device. Outcome: Progressing   Problem: RH PAIN MANAGEMENT Goal: RH STG PAIN MANAGED AT OR BELOW PT'S PAIN GOAL Description: < 4 with prns Outcome: Progressing   Problem: RH KNOWLEDGE DEFICIT GENERAL Goal: RH STG INCREASE KNOWLEDGE OF SELF CARE AFTER HOSPITALIZATION Description: Patient and family will be able to manage care at discharge using educational resources for medications, dietary modification recommendations independently Outcome: Progressing   Problem: Fluid Volume: Goal: Compliance with measures to maintain balanced fluid volume will improve Description: Patient and family will be able to manage care at discharge using educational resources for 1200 cc fluid restriction/ dietary modification recommendations independently Outcome: Progressing   Problem: Health Behavior/Discharge Planning: Goal: Ability to manage health-related needs will improve Outcome: Progressing   Problem: Nutritional: Goal: Ability to make healthy dietary choices will improve Description: Patient and family will be able to manage care at discharge using educational resources for ESRD/HD dietary modification recommendations independently Outcome: Progressing   Problem: Clinical Measurements: Goal: Complications related to the disease process, condition or treatment will be avoided or minimized Description: Patient and  family will be able to manage care at discharge using educational resources for medications, dietary modification recommendations independently Outcome: Progressing   Problem: Education: Goal: Knowledge of disease and its progression will improve Outcome: Progressing Goal: Individualized Educational Video(s) Outcome: Progressing   Problem: Education: Goal: Knowledge of General Education information will improve Description: Including pain rating scale, medication(s)/side effects and non-pharmacologic comfort measures Outcome: Progressing   Problem: Health Behavior/Discharge Planning: Goal: Ability to manage health-related needs will improve Outcome: Progressing   Problem: Clinical Measurements: Goal: Ability to maintain clinical measurements within normal limits will improve Outcome: Progressing Goal: Will remain free from infection Outcome: Progressing Goal: Diagnostic test results will improve Outcome: Progressing Goal: Respiratory complications will improve Outcome: Progressing Goal: Cardiovascular complication will be avoided Outcome: Progressing   Problem: Activity: Goal: Risk for activity intolerance will decrease Outcome: Progressing   Problem: Nutrition: Goal: Adequate nutrition will be maintained Outcome: Progressing   Problem: Coping: Goal: Level of anxiety will decrease Outcome: Progressing   Problem: Elimination: Goal: Will not experience complications related to bowel motility Outcome: Progressing Goal: Will not experience complications related to urinary retention Outcome: Progressing   Problem: Pain Managment: Goal: General experience of comfort will improve and/or be controlled Outcome: Progressing   Problem: Safety: Goal: Ability to remain free from injury will improve Outcome: Progressing   Problem: Skin Integrity: Goal: Risk for impaired skin integrity will decrease Outcome: Progressing

## 2023-07-28 NOTE — Progress Notes (Signed)
 Patient ID: [name], female   DOB: 09/10/1945, 78 y.o.   MRN: [id number] [facility] Progress Note   Assessment/ Plan:   1.  Functional debility/deficits status post acute hypoxic respiratory failure/uremia: Currently admitted to inpatient rehabilitation unit for intensive physical therapy and Occupational Therapy.  Plans noted now for discharge to skilled nursing facility (which in turn may likely affect dialysis unit assignment). 2.  New start ESRD: With recent frequent hospitalizations for volume overload responding to intravenous diuretics but currently at a point where she needed to start dialysis with worsening uremia/volume excess.  Ongoing hemodialysis on MWF schedule via [facility] (placed 06/21/2023 by Dr. Vikki Graves) and with previous plans to begin dialysis at [facility] on Monday, 07/29/2023; the plans have not changed and she will be discharged to a skilled nursing facility when clinically stable (which in turn may affect her eventual placement to a dialysis facility based on proximity-this will be followed up again on Monday by renal navigator).  She required excision of her left upper arm AV graft with vein patch angioplasty of left brachial artery after developing steal symptoms. 3. Anemia: With recent EGD that did not show any evidence of bleeding.  On chronic Eliquis  for history of renal vein thrombosis-dose adjusted to 2.5 mg twice daily.  Hemoglobin and hematocrit appear stable. 4. CKD-MBD: Calcium  and phosphorus levels currently at goal, continue to monitor on renal diet and sevelamer .  PTH at goal. 5. Nutrition: Continue renal diet with fluid restriction and ongoing protein supplementation. 6. Hypertension: Blood pressures currently elevated, continue to monitor with hemodialysis/UF.  Subjective:   Denies any complaints overnight.  Nursing note reviewed and found to be significant for confusion with waxing/waning of orientation.   Objective:   BP (!)  145/59 (BP Location: Right Wrist)   Pulse 82   Temp 98.9 F (37.2 C)   Resp 17   Ht (P) 5\' 4"  (1.626 m)   Wt 71.2 kg   SpO2 98%   BMI (P) 26.94 kg/m   Physical Exam: Gen: Appears to be resting comfortably CVS: Pulse regular rhythm, normal rate, S1 and S2 normal Resp: Clear to auscultation bilaterally, no distinct rales or rhonchi.  Right IJ TDC in place Abd: Soft, flat, nontender, bowel sounds normal Ext: Trace ankle edema.  Left upper arm AV graft excised with intact/healthy scar.  Labs: BMET Recent Labs  Lab 07/22/23 1530 07/24/23 0827 07/26/23 1335  NA 137 136 136  K 5.2* 4.8 4.7  CL 97* 98 96*  CO2 24 25 24   GLUCOSE 162* 164* 153*  BUN 69* 62* 68*  CREATININE 6.81* 6.46* 6.90*  CALCIUM  9.7 9.7 9.6  PHOS 3.1 3.8 3.3   CBC Recent Labs  Lab 07/22/23 1044 07/24/23 0827 07/25/23 0655 07/26/23 1336  WBC 12.0* 12.3* 11.0* 11.8*  NEUTROABS 9.0*  --  8.8*  --   HGB 8.0* 7.3* 7.3* 7.4*  HCT 26.7* 24.6* 24.4* 25.2*  MCV 94.0 95.7 94.2 95.8  PLT 425* 394 370 395      Medications:     apixaban   2.5 mg Oral BID   B-complex with vitamin C  1 tablet Oral Daily   Chlorhexidine  Gluconate Cloth  6 each Topical Q12H   cholecalciferol   1,000 Units Oral Daily   colestipol   1 g Oral TID AC   darbepoetin (ARANESP ) injection - DIALYSIS  200 mcg Subcutaneous Q Sat-1800   diclofenac   1 patch Transdermal BID   FLUoxetine   20 mg Oral Daily  gabapentin   300 mg Oral QHS   kidney failure book   Does not apply Once   l-methylfolate-B6-B12  1 tablet Oral Daily   lipase/protease/amylase  36,000 Units Oral TID AC   memantine   5 mg Oral Daily   mirtazapine   7.5 mg Oral QHS   multivitamin  1 tablet Oral QHS   pantoprazole   40 mg Oral BID   rivastigmine   1.5 mg Oral BID   senna  2 tablet Oral Daily   sevelamer  carbonate  1,600 mg Oral TID WC   sodium chloride  flush  10-40 mL Intracatheter Q12H   torsemide   100 mg Oral Once per day on Sunday Tuesday Thursday Saturday   Clevester Dally, MD 07/28/2023, 9:02 AM

## 2023-07-28 NOTE — Progress Notes (Signed)
 PROGRESS NOTE   Subjective/Complaints:  Pt doing well again today, slept well, pain overall well managed, LBM 2d ago per pt but last documented on 5/8, urinating ok per her usual, had dialysis Friday. Denies any other complaints or concerns.   ROS: +upper and lower extremity weakness- improved, +bowel incontinence, +right knee pain-limits therapy, continues, but is betterr. Denies CP, SOB, abd pain, n/v/d/c, +dizziness- improved   Objective:   No results found.     Recent Labs    07/26/23 1336  WBC 11.8*  HGB 7.4*  HCT 25.2*  PLT 395    Recent Labs    07/26/23 1335  NA 136  K 4.7  CL 96*  CO2 24  GLUCOSE 153*  BUN 68*  CREATININE 6.90*  CALCIUM  9.6     Intake/Output Summary (Last 24 hours) at 07/28/2023 1128 Last data filed at 07/28/2023 1033 Gross per 24 hour  Intake 1270 ml  Output --  Net 1270 ml        Physical Exam: Vital Signs Blood pressure (!) 145/59, pulse 82, temperature 98.9 F (37.2 C), resp. rate 17, height (P) 5\' 4"  (1.626 m), weight 71.2 kg, SpO2 98%.  Gen: no distress, normal appearing, sitting up in bedside chair eating breakfast  HEENT: oral mucosa pink and moist, NCAT Cardio: Reg rate, no m/r/g appreciated Chest: normal effort, normal rate of breathing, CTAB Abd: soft, non-distended, +BS throughout, nonTTP Ext/MsK: no edema peripherally, LUE with graft site at L elbow without significant erythema.  R knee with sutures c/d/I, no significant swelling, no redness/warmth. L knee with less tenderness and no effusion.  Psychiatric:     Comments: Pt flat, cooperative  PRIOR EXAMS: Musculoskeletal:        General: Swelling and tenderness (left antecubital region) present.  Right knee swollen and tender    Cervical back: Normal range of motion.  Skin:    Comments: AVF site CDI. No erythema. Right heel boggy  Neurological:     Mental Status: She is alert.     Comments: Oriented to  self only. Unable to recall age, DOB or place. Poor recall and decrease insight into deficits. She was able to follow simple motor commands. No CN deficits. Limited RUE d/t HD catheter. LUE limited d/t pain. Grip/wrist at least 3-4/5 bilaterally. BLE 3- to 3/5 HF, KE and 4/5 ADF/PF. Decreased LT from ankles down bilaterally. No abnl resting tone. DTR's tr to 1+, stable 5/9 Functional mobility: modA toileting     Assessment/Plan: 1. Functional deficits which require 3+ hours per day of interdisciplinary therapy in a comprehensive inpatient rehab setting. Physiatrist is providing close team supervision and 24 hour management of active medical problems listed below. Physiatrist and rehab team continue to assess barriers to discharge/monitor patient progress toward functional and medical goals  Care Tool:  Bathing    Body parts bathed by patient: Right arm, Left arm, Chest, Abdomen, Front perineal area, Right upper leg, Left upper leg, Right lower leg, Left lower leg, Face   Body parts bathed by helper: Buttocks     Bathing assist Assist Level: Minimal Assistance - Patient > 75%     Upper Body Dressing/Undressing Upper body  dressing   What is the patient wearing?: Pull over shirt    Upper body assist Assist Level: Supervision/Verbal cueing    Lower Body Dressing/Undressing Lower body dressing      What is the patient wearing?: Pants     Lower body assist Assist for lower body dressing: Contact Guard/Touching assist     Toileting Toileting    Toileting assist Assist for toileting: Moderate Assistance - Patient 50 - 74%     Transfers Chair/bed transfer  Transfers assist     Chair/bed transfer assist level: Contact Guard/Touching assist     Locomotion Ambulation   Ambulation assist      Assist level: Minimal Assistance - Patient > 75% Assistive device: Walker-rolling Max distance: 70'   Walk 10 feet activity   Assist  Walk 10 feet activity did not occur:  Safety/medical concerns  Assist level: Minimal Assistance - Patient > 75% Assistive device: Walker-rolling   Walk 50 feet activity   Assist Walk 50 feet with 2 turns activity did not occur: Safety/medical concerns (pain)  Assist level: Minimal Assistance - Patient > 75% Assistive device: Walker-rolling    Walk 150 feet activity   Assist Walk 150 feet activity did not occur: Safety/medical concerns (pain)         Walk 10 feet on uneven surface  activity   Assist Walk 10 feet on uneven surfaces activity did not occur: Safety/medical concerns (pain)         Wheelchair     Assist Is the patient using a wheelchair?: Yes Type of Wheelchair: Manual (lightweight)    Wheelchair assist level: Supervision/Verbal cueing Max wheelchair distance: 114ft    Wheelchair 50 feet with 2 turns activity    Assist        Assist Level: Supervision/Verbal cueing   Wheelchair 150 feet activity     Assist      Assist Level: Supervision/Verbal cueing   Blood pressure (!) 145/59, pulse 82, temperature 98.9 F (37.2 C), resp. rate 17, height (P) 5\' 4"  (1.626 m), weight 71.2 kg, SpO2 98%.  Medical Problem List and Plan: 1. Functional deficits secondary to debility after acute respiratory failure and multiple medical issues             -patient may not yet shower             -ELOS/Goals: 10-14 days, supervision goals with PT, OT, SLP  Updated Josephus Nida (daughter)  -continue CIR  Grounds pass ordered  Decrease to 15/7  Planning for SNF 2.  Antithrombotics: -DVT/anticoagulation:  Pharmaceutical: Eliquis  reduced to 2.5mg  BID as per GI recommendations given positive stool occult. (for renal artery thrombosis)             -antiplatelet therapy: N/A 3. Acute on chronic LBP/Pain Management: Continue Hydrocodone  prn severe pain--monitor for sedation/confusion.              --Was on IV dilaudid  till 04/16-->now on hydrocodone  10 TID on average.              --increased use of  Zanaflex  in past 48 hours.              --continue Gabapentin  300 mg/hs 4. Hx of depression/anxiety/Sleep: LCSW to follow for evaluation and support             --melatonin prn for insomnia.              -antipsychotic agents: N/A             -  pt on chronic prozac  20mg  daily and remeron  7.5mg  nightly 5. Neuropsych/cognition: This patient is not capable of making decisions on her own behalf. 6. Skin/Wound Care: Routine pressure relief measures.              -float both heels 7. Fluids/Electrolytes/Nutrition: Strict I/O. Labs with HD             --1200 cc FR/day.  8. Cellulitis/Possible abscess LUE: On Van since 04/19 and Zosyn  since 04/17 --WBC trending down to 10.5 today. BC X 2 from 04/19 pending--negative so far.  --has been afebrile X 24 hours.  Zosyn  changed to Augmentin  by Dr. Sunnie England. End date for Vanc/augmentin  is 4/29              -pt reports that arm is feeling less tender -07/14/23 no fevers but now worsening leukocytosis, WBC 19.7 yesterday and 20.4 on repeat today, LUE not really that concerning looking -continue vancomycin /ceftriaxone  -SEE BELOW #21-22  9. H/o GIB: Monitor H/H as had further drop. Hgb down to             On IV iron /ESA  -07/27/23 Hgb stable 7-8 range  10. HTN: Monitor BP TID--on Torsemide  on non dialysis days.  -07/20/23 [name] stable, apparently a little soft during dialysis, may need midodrine  on dialysis days per nephro; monitor -5/10-11/25 [name] fair, monitor  Vitals:   07/26/23 1500 07/26/23 1530 07/26/23 1600 07/26/23 1630  BP: (!) 151/58 (!) 139/54 (!) 153/61 (!) 133/59   07/26/23 1639 07/26/23 1642 07/26/23 1746 07/26/23 2018  BP: (!) 141/62 (!) 155/61 (!) 140/65 138/60   07/27/23 0524 07/27/23 1503 07/27/23 1942 07/28/23 0502  BP: (!) 135/57 (!) 146/60 (!) 134/55 (!) 145/59     11. 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 H/o Gastritis/esophagitis: Most recent admission last month. Continue Protonix  40mg  BID.  --Has required one unit  PRBC 04/16.              --monitor for signs of bleeding.   13. Chronic diarrhea:  continue Colestid  1g TID and Creon  36kU tid- changed to after meals as per daughter's request, last BM 5/8 per documentation -07/28/23 no BM since 5/8 per documentation, but pt states 2days ago; monitor for now, but might need meds.   14. H/o cognitive/memory deficits: continue Exelon  1.5mg  bid. Namenda  added  15. ESRD: Now HD dependent--TTS at the end of the day to help with tolerance of therapy             --continue renveal for metabolic bone disease.  Per nephro  -07/20/23 dialysis today, then MWF next week to facilitate d/c  -07/27/23 dialysis yesterday, cont this schedule for now  17. T2DM: Hgb A1c- 6.2 and diet controlled. Add CM restrictions.  -d/c ISS, d/c CBGs  18. Bilateral knee pain: flector  patch ordered for right knee, right knee sleeve ordered, continue  -now s/p b/l aspirations by [name]  19. Bowel incontinence: lumbar MRI showed and discussed there is no evidence of cauda equina  20. New upper extremity weakness: cervical MRI ordered and discussed that there is moderate canal stenosis, discussed with daughter that oral steroids, steroid injection, surgery are possible interventions- deferred at this time given patient's risk factors  21. Right knee effusion: culture reviewed and with 89,000 WBC concerning for septic arthritis, will consult ID, procal elevated, s/p ortho washout, continue ceftriaxone /vanc -per ID, continue vanc, Fortaz  await culture results-- tx 4wks from I&D (4/28)   -07/21/23 intra-op cx  NGTD x2d; both cx from knee aspirations NGTD x3d  22. Leukocytosis:repeat CBC ordered, procal ordered, consulted ID, continue ceftriaxone /vanc Intra-op cultures reviewed and with NGTD final on 5/7 -07/27/23 WBCs generally improving overall.   23. Anemia: Hgb reviewed and is stable, CT reviewed and there is no evidence of bleed, Eliquis  reduced to 2.5mg  BID as per GI  recommendations -07/27/23 of note, CT A/P showed large colonic stool burden; LBM 2d ago per pt, monitor for now-- would recommend advancing meds if no BM by Monday  24. Dementia: added namenda  5mg  daily, continue  25. Neuropathic pain: added namenda  5mg  daily, metanx added, discussed with daughter, continue  51. Hypotension: will d/c tizanidine   LOS: 20 days A FACE TO FACE EVALUATION WAS PERFORMED  [address] 07/28/2023, 11:28 AM

## 2023-07-28 NOTE — Progress Notes (Signed)
 Occupational Therapy Session Note  Patient Details  Name: Nickolette [place]az[place]and Emminger MRN: [id number] Date of Birth: 1946/03/19  Today's Date: 07/28/2023 OT Individual Time: 0800-0900 OT Individual Time Calculation (min): 60 min    Short Term Goals: Week 1:  OT Short Term Goal 1 (Week 1): Pt will complete sit > stand in prep for ADL with CGA using LRAD OT Short Term Goal 1 - Progress (Week 1): Not met OT Short Term Goal 2 (Week 1): Pt will complete toilet transfer with CGA using LRAD OT Short Term Goal 2 - Progress (Week 1): Not met OT Short Term Goal 3 (Week 1): Pt will initiate toileting need with min questioning cues OT Short Term Goal 3 - Progress (Week 1): Met OT Short Term Goal 4 (Week 1): Pt will complete 1/3 toileting steps with min A for balance OT Short Term Goal 4 - Progress (Week 1): Met  Skilled Therapeutic Interventions/Progress Updates:    (1st) Occupational Therapy Treatment Session:   NT in with patient in the restroom at the time of arrival, I assumed  care and  was able to assist the pt with completing toileting hyigene with close S. Patient was able to come from sit to stand using the grab bar and the RW for addiitional balance at CGA. The pt was able to ambulate to her bed using the RW  with CGA for seating  placement EOB.  The pt indicated that she slept well during night, however, she reported at pain response of 8 on 0-10  for the right knee. The  NT administered pain medication upon request.  The pt was able to transfer from EOB to w/c LOF with ModA for coming from sit to stand, which transition to CGA.  The pt was able to transfer  w/c LOF with CGA.  The pt was place in front of the sink and was able to complete a bathing exercise.  The pt was able to remove the hospital gown with MinA.  The pt was able to bathing her UB inclusive of her midriff with s/uA.  The pt was able to complete LB bathing of BLE inclusive of  peri care with ModA.  The pt was Dep for her feet.  The pt was  able to donn her over head shirt with MinA and was ModA for donning her underwear and skirt. The pt was Dep with her socks and shoes. The pt was able to brush her teeth with s/u A.  The pt was transported to the recliner and was able to transfer from the w/c to  the  recliner with ModA for coming from sit to stand, which upon standing  transition to CGA to MinA for placement with vc's.  At the end of the session,  the call light and bedside table were placed within reach with all additional needs addressed.     (2nd) Occupational Therapy Treatment Session:  Patient in the recliner at the time of arrival with family present at the time of treatment.  Patient in agreement with completing UB strengthening to improve core strength.  The pt was instructed to come from sit to stand in front of the mirror, the pt required ModA for coming into standing which transition to CGA using the RW and the arm of the w/c.  The pt was asked to remove suction pegs from the mirror  by alternating hands for retrieval 2x's. The pt went on to complete UB exercise using a 1lb dowel 2 sets of  10 for shld flexion,  horizontal abduction, and shld rotation with each exercise completed below her pain response.  The pt required 3 rest breaks. The pt went on to complete a simulated task in UB dressing 2x with rest breaks as needed, the pt required 2 rest breaks. The pt was able to maneuver the w/c >200 ft using BLE to propel forward incorporating BUE occasionally while going to her room.  At the end of the session, the pt was transported to the recliner and was able to transfer to the recliner from the w/c with ModA for coming into stand, which transition to [facility] for placement onto the recliner.  The pt's  call light and bedside table were placed within reach with  all additional needs addressed prior to exiting the room. The pt's family was  present throughout the treatment  session.  Therapy Documentation Precautions:   Precautions Precautions: Fall Precaution/Restrictions Comments: dementia Restrictions Weight Bearing Restrictions Per Provider Order: No   Therapy/Group: Individual Therapy  Moises Ang 07/28/2023, 12:48 PM

## 2023-07-28 NOTE — Plan of Care (Signed)
  Problem: Consults Goal: RH GENERAL PATIENT EDUCATION Description: See Patient Education module for education specifics. Outcome: Progressing   Problem: RH BOWEL ELIMINATION Goal: RH STG MANAGE BOWEL WITH ASSISTANCE Description: STG Manage Bowel with mod I Assistance. Outcome: Progressing Goal: RH STG MANAGE BOWEL W/MEDICATION W/ASSISTANCE Description: STG Manage Bowel with Medication with mod I Assistance. Outcome: Progressing   Problem: RH SAFETY Goal: RH STG ADHERE TO SAFETY PRECAUTIONS W/ASSISTANCE/DEVICE Description: STG Adhere to Safety Precautions With cues Assistance/Device. Outcome: Progressing Flowsheets (Taken 07/28/2023 1849) STG:Pt will adhere to safety precautions with assistance/device: 4-Minimal assistance   Problem: RH PAIN MANAGEMENT Goal: RH STG PAIN MANAGED AT OR BELOW PT'S PAIN GOAL Description: < 4 with prns Outcome: Progressing

## 2023-07-29 LAB — CBC
HCT: 25.6 % — ABNORMAL LOW (ref 36.0–46.0)
Hemoglobin: 7.5 g/dL — ABNORMAL LOW (ref 12.0–15.0)
MCH: 28 pg (ref 26.0–34.0)
MCHC: 29.3 g/dL — ABNORMAL LOW (ref 30.0–36.0)
MCV: 95.5 fL (ref 80.0–100.0)
Platelets: 419 10*3/uL — ABNORMAL HIGH (ref 150–400)
RBC: 2.68 MIL/uL — ABNORMAL LOW (ref 3.87–5.11)
RDW: 17.1 % — ABNORMAL HIGH (ref 11.5–15.5)
WBC: 11.3 10*3/uL — ABNORMAL HIGH (ref 4.0–10.5)
nRBC: 0 % (ref 0.0–0.2)

## 2023-07-29 LAB — RENAL FUNCTION PANEL
Albumin: 2.5 g/dL — ABNORMAL LOW (ref 3.5–5.0)
Anion gap: 17 — ABNORMAL HIGH (ref 5–15)
BUN: 87 mg/dL — ABNORMAL HIGH (ref 8–23)
CO2: 22 mmol/L (ref 22–32)
Calcium: 9.7 mg/dL (ref 8.9–10.3)
Chloride: 100 mmol/L (ref 98–111)
Creatinine, Ser: 7.66 mg/dL — ABNORMAL HIGH (ref 0.44–1.00)
GFR, Estimated: 5 mL/min — ABNORMAL LOW (ref >60)
Glucose, Bld: 108 mg/dL — ABNORMAL HIGH (ref 70–99)
Phosphorus: 2.9 mg/dL (ref 2.5–4.6)
Potassium: 5.6 mmol/L — ABNORMAL HIGH (ref 3.5–5.1)
Sodium: 139 mmol/L (ref 135–145)

## 2023-07-29 MED ORDER — HEPARIN SODIUM (PORCINE) 1000 UNIT/ML IJ SOLN
INTRAMUSCULAR | Status: AC
  Filled 2023-07-29: qty 4

## 2023-07-29 NOTE — Progress Notes (Signed)
 Physical Therapy Session Note  Patient Details  Name: [name] MRN: [id number] Date of Birth: 1945-04-18  Today's Date: 07/29/2023 PT Individual Time: 2130-8657 PT Individual Time Calculation (min): 68 min   Short Term Goals: Week 1:  PT Short Term Goal 1 (Week 1): pt will transfer bed<>chair with LRAD and CGA PT Short Term Goal 1 - Progress (Week 1): Progressing toward goal PT Short Term Goal 2 (Week 1): pt will transfer sit<>stand with LRAD and CGA PT Short Term Goal 2 - Progress (Week 1): Progressing toward goal PT Short Term Goal 3 (Week 1): pt will ambulate 35ft with LRAD and CGA PT Short Term Goal 3 - Progress (Week 1): Progressing toward goal Week 2:  PT Short Term Goal 1 (Week 2): STG=LTG due to LOS  Skilled Therapeutic Interventions/Progress Updates:   Received pt sitting in WC, pt agreeable to PT treatment, and reported pain in R knee was "a lot" but did not rate severity (premedicated) - provided pt with heat pack for R knee at end of session. Session with emphasis on functional mobility/transfers, generalized strengthening and endurance, dynamic standing balance/coordination, stair navigation, and ambulation. Pt transported to gym in [facility] dependently for time management purposes. Pt performed all transfers with RW and min A throughout session.   Pt ambulated 2ft with RW and CGA with increased time - noted improved stride length and improvements in knee and trunk extension but pt still limited by R knee pain. Pt requesting to sit multiple times but with encouragement was able to make it back to [facility] to sit and rest. Demonstrated technique for curb navigation with RW to simulate home entry (pt with no recall of technique or practicing this prior). Pt navigated 1 4in curb x 2 trials with RW and CGA on trial 1 and min A on trial 2 with cues for technique and RW safety. Nephrology present for brief assessment. Pt then ambulated additional 8ft with RW and CGA fading to close  supervision - pt requesting multiple times to sit, stating she couldn't make it back to [facility] and took seated rest break on mat. Pt then ambulated final 43ft with RW and CGA back to [facility] - noted more crouched gait pattern and difficulty extended knees due to pain and fatigue.   Pt then performed WC mobility 167ft using BUE/BLE and supervision with increased time back to room with emphasis on UE/LE strength and coordination. Pt required multiple rest breaks and cues for propulsion technique to maximize stride length and efficiency - poor carry over with cues. Concluded session with pt sitting in WC, needs within reach, and seatbelt alarm on.   Therapy Documentation Precautions:  Precautions Precautions: Fall Precaution/Restrictions Comments: dementia Restrictions Weight Bearing Restrictions Per Provider Order: No  Therapy/Group: Individual Therapy [name] Nena Bank PT, DPT 07/29/2023, 7:10 AM

## 2023-07-29 NOTE — Progress Notes (Addendum)
 Message received from rehab CSW. Insurance has approved snf but snf cannot transport pt to HD until Friday. Contacted [facility] staff to make them aware of this info and to see if clinic would be agreeable to pt starting on Friday at current appt days/time. Awaiting response from clinic staff. Will assist as needed.   Lauraine Polite Renal Navigator [phone]  Addendum at 2:33 pm: Spoke to clinic staff at [facility] who confirms that pt can start on Friday but if pt does not start on Friday, then appt will have to be given to another pt. This info was provided to rehab CSW,

## 2023-07-29 NOTE — Progress Notes (Signed)
 Occupational Therapy Weekly Progress Note  Patient Details  Name: [name] MRN: [id number] Date of Birth: 1945-12-22  Beginning of progress report period: Jul 22, 2023 End of progress report period: Jul 29, 2023  Today's Date: 07/29/2023 OT Individual Time: 4098-1191 OT Individual Time Calculation (min): 55 min    Patient has met 0 of 1 short term goals d/t STGs not written based on Pt's ELOS. Pt is demonstrating very slow progress towards reaching her LTGS. She is currently able to complete stand transfers to toilet with CGA using RW and ambulatory transfers with CGA-min A using RW depending on energy level and pain. She is completing UB ADLS with supervision, LB ADLs/CGA-min A, and toileting min A. She continues to be limited by R knee pain, weakness/deconditioning and baseline cognitive deficits.   Patient continues to demonstrate the following deficits: muscle weakness and muscle joint tightness, decreased cardiorespiratoy endurance, impaired timing and sequencing and decreased motor planning, decreased motor planning, decreased attention, decreased awareness, decreased problem solving, decreased safety awareness, decreased memory, and delayed processing, and decreased standing balance, decreased postural control, and decreased balance strategies and therefore will continue to benefit from skilled OT intervention to enhance overall performance with BADL and Reduce care partner burden.  Patient progressing toward long term goals..  Continue plan of care.  OT Short Term Goals Week 3:  OT Short Term Goal 1 (Week 3): STG = LTG due to ELOS OT Short Term Goal 1 - Progress (Week 3): Progressing toward goal Week 4:  OT Short Term Goal 1 (Week 4): STG=LTG d/t ELOS  Skilled Therapeutic Interventions/Progress Updates:     Pt received sitting EOB presenting to be in good spirits receptive to skilled OT session reporting 5/10 pain in R knee- OT offering intermittent rest breaks,  repositioning, and therapeutic support to optimize participation in therapy session. Pt provided with pain medications prior to OT session. Pt requesting to complete morning routine this AM. Focused session on BADL retraining with emphasis on safety awareness and increasing Pt's overall independence to decrease bourdon of care. D/t HD ports, opted to complete sponge bath this session. Engaged Pt in completing functional mobility to wc using RW for endurance training with Pt able to complete ~10 ft with light min A for balance and RW management. Positioned Pt at sink in w/c for sponge bathing. Doffed shirt with supervision in seated position and min A required to doff pants from waist in standing position. UB bathing and grooming task completed with supervision from wc. Min A required for LB bathing to wash buttocks and anterior peri-area in standing position- with with heavy reliance on B UE support on RW when standing and reporting increased R knee pain in WB'ing. Donned clean shirt supervision and pants min A with assistance required to fully bring pants to waist in standing. Pt declining all other therapeutic activities at this time, however receptive to engaging in short distance functional mobility training using RW to simulate ambulating household distances. Pt was able to complete ~25 ft x2 trials with CGA and heavy reliance on B UE support on RW- increased time for rest breaks required between trials d/t endurance deficit and R knee pain. Pt was left resting in wc with call bell in reach, seatbelt alarm on, telesitter on, and all needs met.    Therapy Documentation Precautions:  Precautions Precautions: Fall Precaution/Restrictions Comments: dementia Restrictions Weight Bearing Restrictions Per Provider Order: No   Therapy/Group: Individual Therapy  Geoffery Kiel 07/29/2023, 7:55 AM

## 2023-07-29 NOTE — Progress Notes (Signed)
 Physical Therapy Weekly Progress Note  Patient Details  Name: [name] MRN: [id number] Date of Birth: [birth year]  Beginning of progress report period: July 09, 2023 End of progress report period: Jul 29, 2023  Patient has met 4 of 10 long term goals.  Pt demonstrates very slow progress towards long term goals. Pt is currently able to perform bed mobility mod I using bed features with increased time, sit<>stands with RW and min A, stand<>pivots with RW and CGA, ambulate up to 73ft with RW and min A, and navigate 1 4in step with RW and min A. Pt continues to be limited by bilateral knee pain (R>L), weakness/deconditioning, and cognitive impairments resulting from baseline dementia.   Patient continues to demonstrate the following deficits muscle weakness and muscle joint tightness, decreased cardiorespiratoy endurance, impaired timing and sequencing, decreased coordination, and decreased motor planning, decreased attention, decreased awareness, decreased problem solving, decreased safety awareness, and decreased memory, and decreased standing balance, decreased postural control, and decreased balance strategies and therefore will continue to benefit from skilled PT intervention to increase functional independence with mobility.  Patient progressing toward long term goals..  Continue plan of care.  PT Short Term Goals Week 2:  PT Short Term Goal 1 (Week 2): STG=LTG due to LOS Week 3:  PT Short Term Goal 1 (Week 3): STG=LTG due to extended LOS (now pending SNF placement)  Skilled Therapeutic Interventions/Progress Updates:  Ambulation/gait training;Discharge planning;Functional mobility training;Therapeutic Activities;Balance/vestibular training;Disease management/prevention;Neuromuscular re-education;Skin care/wound management;Therapeutic Exercise;Wheelchair propulsion/positioning;Cognitive remediation/compensation;DME/adaptive equipment instruction;Pain  management;Splinting/orthotics;UE/LE Strength taining/ROM;Community reintegration;Patient/family education;Stair training;UE/LE Coordination activities   Therapy Documentation Precautions:  Precautions Precautions: Fall Precaution/Restrictions Comments: dementia Restrictions Weight Bearing Restrictions Per Provider Order: No  Therapy/Group: Individual Therapy [name] Nena Bank PT, DPT 07/29/2023, 7:25 AM

## 2023-07-29 NOTE — Progress Notes (Signed)
 Patient ID: [name], female   DOB: August 19, 1945, 78 y.o.   MRN: [id number]  Pt received auth for [facility] beginning on 5/10. Auhr# [id number]; CM- Kennard Pea (fax#[phone]).   1349- SW called insurance to follow-up about authorization status. SW informed that Siegfried Dress is good for 7 days so until 5/17.  SW received updates from Star/Admissions with [facility] reporting that their transportation is full. Can accept as early as Friday. Dailysis Coord-Tracy shares that she spoke with [facility] about this as well, and if pt does not begin dialysis on Friday, than pt will lose dialysis seat.   14- SW spoke with pt dtr [name] to discuss pt is good for placement and pt will d/c on Thursday to begin dialysis on Friday. She reports that they are going to look at another SNF location. She will confirm their preference tomorrow.   Norval Been, MSW, LCSW Office: [phone] Cell: [phone] Fax: [phone]

## 2023-07-29 NOTE — Progress Notes (Signed)
 PROGRESS NOTE   Subjective/Complaints: Has spot at [facility] tomorrow Patient's chart reviewed- No issues reported overnight Vitals signs stable except for HTn   ROS: +upper and lower extremity weakness- improved, +bowel incontinence, +right knee pain-limits therapy, continues, but is betterr. Denies CP, SOB, abd pain, n/v/d/c, +dizziness- improved   Objective:   No results found.     Recent Labs    07/26/23 1336  WBC 11.8*  HGB 7.4*  HCT 25.2*  PLT 395    Recent Labs    07/26/23 1335  NA 136  K 4.7  CL 96*  CO2 24  GLUCOSE 153*  BUN 68*  CREATININE 6.90*  CALCIUM  9.6     Intake/Output Summary (Last 24 hours) at 07/29/2023 1219 Last data filed at 07/29/2023 0802 Gross per 24 hour  Intake 608 ml  Output --  Net 608 ml        Physical Exam: Vital Signs Blood pressure (!) 149/67, pulse 81, temperature 98.3 F (36.8 C), resp. rate 18, height (P) 5\' 4"  (1.626 m), weight 72 kg, SpO2 97%.  Gen: no distress, normal appearing, sitting up in bedside chair eating breakfast  HEENT: oral mucosa pink and moist, NCAT Cardio: Reg rate, no m/r/g appreciated Chest: normal effort, normal rate of breathing, CTAB Abd: soft, non-distended, +BS throughout, nonTTP Ext/MsK: no edema peripherally, LUE with graft site at L elbow without significant erythema.  R knee with sutures c/d/I, no significant swelling, no redness/warmth. L knee with less tenderness and no effusion.  Psychiatric:     Comments: Pt flat, cooperative Musculoskeletal:        General: Swelling and tenderness (left antecubital region) present.  Right knee swollen and tender    Cervical back: Normal range of motion.  Skin:    Comments: AVF site CDI. No erythema. Right heel boggy  Neurological:     Mental Status: She is alert.     Comments: Oriented to self only. Unable to recall age, DOB or place. Poor recall and decrease insight into deficits. She was  able to follow simple motor commands. No CN deficits. Limited RUE d/t HD catheter. LUE limited d/t pain. Grip/wrist at least 3-4/5 bilaterally. BLE 3- to 3/5 HF, KE and 4/5 ADF/PF. Decreased LT from ankles down bilaterally. No abnl resting tone. DTR's tr to 1+, stable 5/12 Functional mobility: modA toileting     Assessment/Plan: 1. Functional deficits which require 3+ hours per day of interdisciplinary therapy in a comprehensive inpatient rehab setting. Physiatrist is providing close team supervision and 24 hour management of active medical problems listed below. Physiatrist and rehab team continue to assess barriers to discharge/monitor patient progress toward functional and medical goals  Care Tool:  Bathing    Body parts bathed by patient: Right arm, Left arm, Chest, Abdomen, Front perineal area, Right upper leg, Left upper leg, Right lower leg, Left lower leg, Face   Body parts bathed by helper: Buttocks     Bathing assist Assist Level: Minimal Assistance - Patient > 75%     Upper Body Dressing/Undressing Upper body dressing   What is the patient wearing?: Pull over shirt    Upper body assist Assist Level: Supervision/Verbal cueing  Lower Body Dressing/Undressing Lower body dressing      What is the patient wearing?: Pants     Lower body assist Assist for lower body dressing: Minimal Assistance - Patient > 75%     Toileting Toileting    Toileting assist Assist for toileting: Moderate Assistance - Patient 50 - 74%     Transfers Chair/bed transfer  Transfers assist     Chair/bed transfer assist level: Contact Guard/Touching assist     Locomotion Ambulation   Ambulation assist      Assist level: Minimal Assistance - Patient > 75% Assistive device: Walker-rolling Max distance: 70'   Walk 10 feet activity   Assist  Walk 10 feet activity did not occur: Safety/medical concerns  Assist level: Minimal Assistance - Patient > 75% Assistive device:  Walker-rolling   Walk 50 feet activity   Assist Walk 50 feet with 2 turns activity did not occur: Safety/medical concerns (pain)  Assist level: Minimal Assistance - Patient > 75% Assistive device: Walker-rolling    Walk 150 feet activity   Assist Walk 150 feet activity did not occur: Safety/medical concerns (pain)         Walk 10 feet on uneven surface  activity   Assist Walk 10 feet on uneven surfaces activity did not occur: Safety/medical concerns (pain)         Wheelchair     Assist Is the patient using a wheelchair?: Yes Type of Wheelchair: Manual    Wheelchair assist level: Supervision/Verbal cueing Max wheelchair distance: 166ft    Wheelchair 50 feet with 2 turns activity    Assist        Assist Level: Supervision/Verbal cueing   Wheelchair 150 feet activity     Assist      Assist Level: Supervision/Verbal cueing   Blood pressure (!) 149/67, pulse 81, temperature 98.3 F (36.8 C), resp. rate 18, height (P) 5\' 4"  (1.626 m), weight 72 kg, SpO2 97%.  Medical Problem List and Plan: 1. Functional deficits secondary to debility after acute respiratory failure and multiple medical issues             -patient may not yet shower             -ELOS/Goals: 10-14 days, supervision goals with PT, OT, SLP  -continue CIR  Grounds pass ordered  Decrease to 15/7  Planning for SNF, discussed with daughter 2.  Antithrombotics: -DVT/anticoagulation:  Pharmaceutical: Eliquis  reduced to 2.5mg  BID as per GI recommendations given positive stool occult. (for renal artery thrombosis)             -antiplatelet therapy: N/A 3. Acute on chronic LBP/Pain Management: Continue Hydrocodone  prn severe pain--monitor for sedation/confusion.              --Was on IV dilaudid  till 04/16-->now on hydrocodone  10 TID on average.              --increased use of Zanaflex  in past 48 hours.              --continue Gabapentin  300 mg/hs 4. Hx of depression/anxiety/Sleep: LCSW  to follow for evaluation and support             --melatonin prn for insomnia.              -antipsychotic agents: N/A             -pt on chronic prozac  20mg  daily and remeron  7.5mg  nightly 5. Neuropsych/cognition: This patient is not capable of making decisions on  her own behalf. 6. Skin/Wound Care: Routine pressure relief measures.              -float both heels 7. Fluids/Electrolytes/Nutrition: Strict I/O. Labs with HD             --1200 cc FR/day.  8. Cellulitis/Possible abscess LUE: On Van since 04/19 and Zosyn  since 04/17 --WBC trending down to 10.5 today. BC X 2 from 04/19 pending--negative so far.  --has been afebrile X 24 hours.  Zosyn  changed to Augmentin  by Dr. Sunnie England. End date for Vanc/augmentin  is 4/29              -pt reports that arm is feeling less tender -07/14/23 no fevers but now worsening leukocytosis, WBC 19.7 yesterday and 20.4 on repeat today, LUE not really that concerning looking -continue vancomycin /ceftriaxone  -SEE BELOW #21-22  9. H/o GIB: Monitor H/H as had further drop. Hgb down to             On IV iron /ESA  -07/27/23 Hgb stable 7-8 range  10. HTN: Monitor BP TID--on Torsemide  on non dialysis days.  -07/20/23 [name] stable, apparently a little soft during dialysis, may need midodrine  on dialysis days per nephro; monitor -5/10-11/25 [name] fair, monitor  Vitals:   07/26/23 1630 07/26/23 1639 07/26/23 1642 07/26/23 1746  BP: (!) 133/59 (!) 141/62 (!) 155/61 (!) 140/65   07/26/23 2018 07/27/23 0524 07/27/23 1503 07/27/23 1942  BP: 138/60 (!) 135/57 (!) 146/60 (!) 134/55   07/28/23 0502 07/28/23 1515 07/28/23 1948 07/29/23 0526  BP: (!) 145/59 (!) 129/53 (!) 131/44 (!) 149/67     11. 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 H/o Gastritis/esophagitis: Most recent admission last month. Continue Protonix  40mg  BID.  --Has required one unit PRBC 04/16.              --monitor for signs of bleeding.   13. Chronic diarrhea:  continue Colestid  1g TID  and Creon  36kU tid- changed to after meals as per daughter's request, last BM 5/8 per documentation -07/28/23 no BM since 5/8 per documentation, but pt states 2days ago; monitor for now, but might need meds.   14. H/o cognitive/memory deficits: continue Exelon  1.5mg  bid. Namenda  added  15. ESRD: Now HD dependent--TTS at the end of the day to help with tolerance of therapy             --continue renveal for metabolic bone disease.  Per nephro  -07/20/23 dialysis today, then MWF next week to facilitate d/c  -07/27/23 dialysis yesterday, cont this schedule for now  17. T2DM: Hgb A1c- 6.2 and diet controlled. Add CM restrictions.  -d/c ISS, d/c CBGs  18. Bilateral knee pain: flector  patch ordered for right knee, right knee sleeve ordered, continue  -now s/p b/l aspirations by [name]  19. Bowel incontinence: lumbar MRI showed and discussed there is no evidence of cauda equina  20. New upper extremity weakness: cervical MRI ordered and discussed that there is moderate canal stenosis, discussed with daughter that oral steroids, steroid injection, surgery are possible interventions- deferred at this time given patient's risk factors  21. Right knee effusion: culture reviewed and with 89,000 WBC concerning for septic arthritis, will consult ID, procal elevated, s/p ortho washout, continue ceftriaxone /vanc -per ID, continue vanc, Fortaz  await culture results-- tx 4wks from I&D (4/28)   -07/21/23 intra-op cx NGTD x2d; both cx from knee aspirations NGTD x3d  22. Leukocytosis:repeat CBC ordered, procal ordered, consulted ID, continue ceftriaxone /vanc Intra-op  cultures reviewed and with NGTD final on 5/7 -07/27/23 WBCs generally improving overall.   23. Anemia: Hgb reviewed and is stable, CT reviewed and there is no evidence of bleed, Eliquis  reduced to 2.5mg  BID as per GI recommendations, continue to monitor Hgb with HD  24. Dementia: added namenda  5mg  daily, continue  25. Neuropathic pain:  added namenda  5mg  daily, metanx added, discussed with daughter, continue  54. Hypotension: will d/c tizanidine , resolved  27. Constipation: chart reviewed, last BM was 5/11  LOS: 21 days A FACE TO FACE EVALUATION WAS PERFORMED  Keven Pel Anav Lammert 07/29/2023, 12:19 PM

## 2023-07-29 NOTE — Progress Notes (Signed)
 [facility] NEPHROLOGY PROGRESS NOTE  Assessment/ Plan: Pt is a 78 y.o. yo female   # Functional debility/deficits status post acute hypoxic respiratory failure/uremia: Currently admitted to inpatient rehabilitation unit for intensive physical therapy and Occupational Therapy.  Plans noted now for discharge to skilled nursing facility (which in turn may likely affect dialysis unit assignment).  # New start ESRD: With recent frequent hospitalizations for volume overload.  Ongoing hemodialysis on MWF schedule via TDC (placed 06/21/2023 by Dr. Vikki Graves). Outpatient dialysis to [facility] at [facility], awaiting SNF placement to decide the date.    # Vascular access: She required excision of her left upper arm AV graft with vein patch angioplasty of left brachial artery after developing steal symptoms.  Currently using tunneled HD catheter.  # Anemia: With recent EGD that did not show any evidence of bleeding.  On chronic Eliquis  for history of renal vein thrombosis-dose adjusted to 2.5 mg twice daily.  Hemoglobin and hematocrit appear stable.  # CKD-MBD: Calcium  and phosphorus levels currently at goal, continue to monitor on renal diet and sevelamer .  PTH at goal.  # Nutrition: Continue renal diet with fluid restriction and ongoing protein supplementation.  # Hypertension: Blood pressures currently elevated, continue to monitor with hemodialysis/UF.  Subjective: Seen and examined.  Denies nausea, vomiting, chest pain, shortness of breath.  Doing rehab.  Plan for HD today. Objective Vital signs in last 24 hours: Vitals:   07/28/23 1515 07/28/23 1948 07/29/23 0500 07/29/23 0526  BP: (!) 129/53 (!) 131/44  (!) 149/67  Pulse: 79 87  81  Resp: 16 18  18   Temp: 98.4 F (36.9 C) 98.5 F (36.9 C)  98.3 F (36.8 C)  TempSrc:      SpO2: 97% 98%  97%  Weight:   72 kg   Height:       Weight change: 0.8 kg  Intake/Output Summary (Last 24 hours) at 07/29/2023 1036 Last data  filed at 07/29/2023 0802 Gross per 24 hour  Intake 608 ml  Output --  Net 608 ml       Labs: RENAL PANEL Recent Labs  Lab 07/22/23 1530 07/24/23 0827 07/26/23 1335  NA 137 136 136  K 5.2* 4.8 4.7  CL 97* 98 96*  CO2 24 25 24   GLUCOSE 162* 164* 153*  BUN 69* 62* 68*  CREATININE 6.81* 6.46* 6.90*  CALCIUM  9.7 9.7 9.6  PHOS 3.1 3.8 3.3  ALBUMIN  2.3* 2.2* 2.4*    Liver Function Tests: Recent Labs  Lab 07/22/23 1530 07/24/23 0827 07/26/23 1335  ALBUMIN  2.3* 2.2* 2.4*   No results for input(s): "LIPASE", "AMYLASE" in the last 168 hours. No results for input(s): "AMMONIA" in the last 168 hours. CBC: Recent Labs    01/31/23 1437 02/13/23 1332 02/28/23 1411 03/14/23 1450 04/04/23 1445 04/04/23 1446 04/18/23 1433 04/18/23 1434 05/08/23 1833 05/13/23 0358 05/13/23 0535 05/14/23 0802 05/28/23 0459 05/29/23 0735 06/17/23 0330 06/18/23 0334 07/21/23 0726 07/22/23 1044 07/24/23 0827 07/25/23 0655 07/26/23 1336  HGB 10.2*   < > 9.9*   < > 9.9*  --   --  9.4*   < >  --   --    < > 7.9*   < > 8.7*   < > 7.7* 8.0* 7.3* 7.3* 7.4*  MCV 87.7   < > 85.2   < > 85.9  --   --  86.4   < >  --   --    < > 87.1   < >  89.4   < > 92.9 94.0 95.7 94.2 95.8  VITAMINB12 1,098*  --  833  --  700  --   --  835  --  1,040*  --   --   --   --   --   --   --   --   --   --   --   FOLATE  --   --   --   --   --   --   --   --   --   --  5.3*  --   --   --   --   --   --   --   --   --   --   FERRITIN  --   --   --   --   --  153 226  --   --  220  --   --  246  --  192  --   --   --   --   --   --   TIBC  --   --   --   --   --   --   --   --   --  206*  --   --  207*  --  267  --   --   --   --   --   --   IRON   --   --   --   --   --   --   --   --   --  46  --   --  73  --  40  --   --   --   --   --   --   RETICCTPCT  --   --   --   --   --   --   --   --   --  1.5  --   --   --   --   --   --   --   --   --   --   --    < > = values in this interval not displayed.    Cardiac  Enzymes: No results for input(s): "CKTOTAL", "CKMB", "CKMBINDEX", "TROPONINI" in the last 168 hours. CBG: No results for input(s): "GLUCAP" in the last 168 hours.  Iron  Studies: No results for input(s): "IRON ", "TIBC", "TRANSFERRIN", "FERRITIN" in the last 72 hours. Studies/Results: No results found.  Medications: Infusions:  cefTAZidime  (FORTAZ )  IV 0.5 g (07/28/23 2053)   vancomycin  Stopped (07/26/23 1817)    Scheduled Medications:  apixaban   2.5 mg Oral BID   B-complex with vitamin C  1 tablet Oral Daily   Chlorhexidine  Gluconate Cloth  6 each Topical Q12H   cholecalciferol   1,000 Units Oral Daily   colestipol   1 g Oral TID AC   darbepoetin (ARANESP ) injection - DIALYSIS  200 mcg Subcutaneous Q Sat-1800   diclofenac   1 patch Transdermal BID   FLUoxetine   20 mg Oral Daily   gabapentin   300 mg Oral QHS   kidney failure book   Does not apply Once   l-methylfolate-B6-B12  1 tablet Oral Daily   lipase/protease/amylase  36,000 Units Oral TID AC   memantine   5 mg Oral Daily   mirtazapine   7.5 mg Oral QHS   multivitamin  1 tablet Oral QHS   pantoprazole   40 mg Oral BID   rivastigmine   1.5 mg Oral BID  senna  2 tablet Oral Daily   sevelamer  carbonate  1,600 mg Oral TID WC   sodium chloride  flush  10-40 mL Intracatheter Q12H   torsemide   100 mg Oral Once per day on Sunday Tuesday Thursday Saturday    have reviewed scheduled and prn medications.  Physical Exam: General:NAD, comfortable Heart:RRR, s1s2 nl Lungs:clear b/l, no crackle Abdomen:soft, Non-tender, non-distended Extremities:No edema Dialysis Access: TDC, left AV graft excised.  [name] [name] 07/29/2023,10:36 AM  LOS: 21 days

## 2023-07-30 MED ORDER — CHLORHEXIDINE GLUCONATE CLOTH 2 % EX PADS
6.0000 | MEDICATED_PAD | Freq: Every day | CUTANEOUS | Status: DC
Start: 2023-07-30 — End: 2023-08-01
  Administered 2023-07-30 – 2023-08-01 (×3): 6 via TOPICAL

## 2023-07-30 NOTE — Progress Notes (Signed)
 PROGRESS NOTE   Subjective/Complaints: No new complaints this morning Planning for SNF placement Continues to have right knee pain, does limit therapy   ROS: +upper and lower extremity weakness- improved, +bowel incontinence, +right knee pain-limits therapy, continues but is betterr. Denies CP, SOB, abd pain, n/v/d/c, +dizziness- improved   Objective:   No results found.     Recent Labs    07/29/23 1406  WBC 11.3*  HGB 7.5*  HCT 25.6*  PLT 419*    Recent Labs    07/29/23 1407  NA 139  K 5.6*  CL 100  CO2 22  GLUCOSE 108*  BUN 87*  CREATININE 7.66*  CALCIUM  9.7     Intake/Output Summary (Last 24 hours) at 07/30/2023 1311 Last data filed at 07/30/2023 0935 Gross per 24 hour  Intake 350 ml  Output 1.8 ml  Net 348.2 ml     Pressure Injury 07/29/23 Heel Left Deep Tissue Pressure Injury - Purple or maroon localized area of discolored intact skin or blood-filled blister due to damage of underlying soft tissue from pressure and/or shear. (Active)  07/29/23 1856  Location: Heel  Location Orientation: Left  Staging: Deep Tissue Pressure Injury - Purple or maroon localized area of discolored intact skin or blood-filled blister due to damage of underlying soft tissue from pressure and/or shear.  Wound Description (Comments):   Present on Admission: No    Physical Exam: Vital Signs Blood pressure 133/62, pulse 77, temperature 98.7 F (37.1 C), temperature source Oral, resp. rate 17, height (P) 5\' 4"  (1.626 m), weight 74.5 kg, SpO2 98%.  Gen: no distress, normal appearing, sitting up in bedside chair eating breakfast  HEENT: oral mucosa pink and moist, NCAT Cardio: Reg rate, no m/r/g appreciated Chest: normal effort, normal rate of breathing, CTAB Abd: soft, non-distended, +BS throughout, nonTTP Ext/MsK: no edema peripherally, LUE with graft site at L elbow without significant erythema.  R knee with sutures  c/d/I, no significant swelling, no redness/warmth. L knee with less tenderness and no effusion.  Psychiatric:     Comments: Pt flat, cooperative Musculoskeletal:        General: Swelling and tenderness (left antecubital region) present.  Right knee swollen and tender    Cervical back: Normal range of motion.  Skin:    Comments: AVF site CDI. No erythema. Right heel boggy  Neurological:     Mental Status: She is alert.     Comments: Oriented to self only. Unable to recall age, DOB or place. Poor recall and decrease insight into deficits. She was able to follow simple motor commands. No CN deficits. Limited RUE d/t HD catheter. LUE limited d/t pain. Grip/wrist at least 3-4/5 bilaterally. BLE 3- to 3/5 HF, KE and 4/5 ADF/PF. Decreased LT from ankles down bilaterally. No abnl resting tone. DTR's tr to 1+, stable 5/13 Functional mobility: modA toileting     Assessment/Plan: 1. Functional deficits which require 3+ hours per day of interdisciplinary therapy in a comprehensive inpatient rehab setting. Physiatrist is providing close team supervision and 24 hour management of active medical problems listed below. Physiatrist and rehab team continue to assess barriers to discharge/monitor patient progress toward functional and medical goals  Care Tool:  Bathing    Body parts bathed by patient: Right arm, Left arm, Chest, Abdomen, Front perineal area, Right upper leg, Left upper leg, Right lower leg, Left lower leg, Face   Body parts bathed by helper: Buttocks     Bathing assist Assist Level: Minimal Assistance - Patient > 75%     Upper Body Dressing/Undressing Upper body dressing   What is the patient wearing?: Pull over shirt    Upper body assist Assist Level: Supervision/Verbal cueing    Lower Body Dressing/Undressing Lower body dressing      What is the patient wearing?: Pants     Lower body assist Assist for lower body dressing: Minimal Assistance - Patient > 75%      Toileting Toileting    Toileting assist Assist for toileting: Moderate Assistance - Patient 50 - 74%     Transfers Chair/bed transfer  Transfers assist     Chair/bed transfer assist level: Contact Guard/Touching assist     Locomotion Ambulation   Ambulation assist      Assist level: Minimal Assistance - Patient > 75% Assistive device: Walker-rolling Max distance: 70'   Walk 10 feet activity   Assist  Walk 10 feet activity did not occur: Safety/medical concerns  Assist level: Minimal Assistance - Patient > 75% Assistive device: Walker-rolling   Walk 50 feet activity   Assist Walk 50 feet with 2 turns activity did not occur: Safety/medical concerns (pain)  Assist level: Minimal Assistance - Patient > 75% Assistive device: Walker-rolling    Walk 150 feet activity   Assist Walk 150 feet activity did not occur: Safety/medical concerns (pain)         Walk 10 feet on uneven surface  activity   Assist Walk 10 feet on uneven surfaces activity did not occur: Safety/medical concerns (pain)         Wheelchair     Assist Is the patient using a wheelchair?: Yes Type of Wheelchair: Manual    Wheelchair assist level: Supervision/Verbal cueing Max wheelchair distance: 136ft    Wheelchair 50 feet with 2 turns activity    Assist        Assist Level: Supervision/Verbal cueing   Wheelchair 150 feet activity     Assist      Assist Level: Supervision/Verbal cueing   Blood pressure 133/62, pulse 77, temperature 98.7 F (37.1 C), temperature source Oral, resp. rate 17, height (P) 5\' 4"  (1.626 m), weight 74.5 kg, SpO2 98%.  Medical Problem List and Plan: 1. Functional deficits secondary to debility after acute respiratory failure and multiple medical issues             -patient may not yet shower             -ELOS/Goals: 10-14 days, supervision goals with PT, OT, SLP  -continue CIR  Grounds pass ordered  Decrease to 15/7  Planning  for SNF, discussed with daughter 2.  Antithrombotics: -DVT/anticoagulation:  Pharmaceutical: Eliquis  reduced to 2.5mg  BID as per GI recommendations given positive stool occult. (for renal artery thrombosis)             -antiplatelet therapy: N/A 3. Acute on chronic LBP/Pain Management: Continue Hydrocodone  prn severe pain--monitor for sedation/confusion.              --Was on IV dilaudid  till 04/16-->now on hydrocodone  10 TID on average.              --increased use of Zanaflex  in past 48 hours.              --  continue Gabapentin  300 mg/hs 4. Hx of depression/anxiety/Sleep: LCSW to follow for evaluation and support             --melatonin prn for insomnia.              -antipsychotic agents: N/A             -pt on chronic prozac  20mg  daily and remeron  7.5mg  nightly 5. Neuropsych/cognition: This patient is not capable of making decisions on her own behalf. 6. Skin/Wound Care: Routine pressure relief measures.              -float both heels 7. Fluids/Electrolytes/Nutrition: Strict I/O. Labs with HD             --1200 cc FR/day.  8. Cellulitis/Possible abscess LUE: On Van since 04/19 and Zosyn  since 04/17 --WBC trending down to 10.5 today. BC X 2 from 04/19 pending--negative so far.  --has been afebrile X 24 hours.  Zosyn  changed to Augmentin  by Dr. Sunnie England. End date for Vanc/augmentin  is 4/29              -pt reports that arm is feeling less tender -07/14/23 no fevers but now worsening leukocytosis, WBC 19.7 yesterday and 20.4 on repeat today, LUE not really that concerning looking -continue vancomycin /ceftriaxone  -SEE BELOW #21-22  9. H/o GIB: Monitor H/H as had further drop. Hgb down to             On IV iron /ESA  -07/27/23 Hgb stable 7-8 range  10. HTN: Monitor BP TID--on Torsemide  on non dialysis days.  -07/20/23 [name] stable, apparently a little soft during dialysis, may need midodrine  on dialysis days per nephro; monitor -5/10-11/25 [name] fair, monitor  Vitals:   07/29/23 0526 07/29/23  1430 07/29/23 1500 07/29/23 1530  BP: (!) 149/67 (!) 147/73 (!) 125/56 (!) 125/56   07/29/23 1600 07/29/23 1633 07/29/23 1700 07/29/23 1734  BP: (!) 117/58 119/63 122/65 132/66   07/29/23 1739 07/29/23 1814 07/29/23 2028 07/30/23 0518  BP: (!) 153/68 (!) 158/64 (!) 147/54 133/62     11. 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 H/o Gastritis/esophagitis: Most recent admission last month. Continue Protonix  40mg  BID.  --Has required one unit PRBC 04/16.              --monitor for signs of bleeding.   13. Chronic diarrhea:  continue Colestid  1g TID and Creon  36kU tid- changed to after meals as per daughter's request, last BM 5/8 per documentation -07/28/23 no BM since 5/8 per documentation, but pt states 2days ago; monitor for now, but might need meds.   14. H/o cognitive/memory deficits: continue Exelon  1.5mg  bid. Namenda  added  15. ESRD: Now HD dependent--TTS at the end of the day to help with tolerance of therapy             --continue renveal for metabolic bone disease.  Per nephro  -07/20/23 dialysis today, then MWF next week to facilitate d/c  -07/27/23 dialysis yesterday, cont this schedule for now  17. T2DM: Hgb A1c- 6.2 and diet controlled. Add CM restrictions.  -d/c ISS, d/c CBGs  18. Bilateral knee pain: flector  patch ordered for right knee, right knee sleeve ordered, continue  -now s/p b/l aspirations by [name]  19. Bowel incontinence: lumbar MRI showed and discussed there is no evidence of cauda equina  20. New upper extremity weakness: cervical MRI ordered and discussed that there is moderate canal stenosis, discussed with daughter that oral steroids, steroid  injection, surgery are possible interventions- deferred at this time given patient's risk factors  21. Right knee effusion: culture reviewed and with 89,000 WBC concerning for septic arthritis, will consult ID, procal elevated, s/p ortho washout, continue ceftriaxone /vanc -per ID, continue vanc,  Fortaz  await culture results-- tx 4wks from I&D (4/28)   -07/21/23 intra-op cx NGTD x2d; both cx from knee aspirations NGTD x3d  22. Leukocytosis:repeat CBC ordered, procal ordered, consulted ID, continue Fortaz   Intra-op cultures reviewed and with NGTD final on 5/7  23. Anemia: Hgb reviewed and is stable, CT reviewed and there is no evidence of bleed, Eliquis  reduced to 2.5mg  BID as per GI recommendations, continue to monitor Hgb with HD  24. Dementia: added namenda  5mg  daily, continue  25. Neuropathic pain: added namenda  5mg  daily, metanx added, discussed with daughter, continue  66. Hypotension: will d/c tizanidine , resolved  27. Constipation: chart reviewed, last BM was 5/12  LOS: 22 days A FACE TO FACE EVALUATION WAS PERFORMED  [name] P Ajla Mcgeachy 07/30/2023, 1:11 PM

## 2023-07-30 NOTE — Progress Notes (Signed)
 [facility] NEPHROLOGY PROGRESS NOTE  Assessment/ Plan: Pt is a 78 y.o. yo female   # Functional debility/deficits status post acute hypoxic respiratory failure/uremia: Currently admitted to inpatient rehabilitation unit for intensive physical therapy and Occupational Therapy.  Plans noted now for discharge to skilled nursing facility (which in turn may likely affect dialysis unit assignment).  # New start ESRD: With recent frequent hospitalizations for volume overload.  Ongoing hemodialysis on MWF schedule via TDC (placed 06/21/2023 by Dr. Vikki Graves). Outpatient dialysis to begin at [facility], awaiting SNF placement to decide the date.  Tolerated dialysis well yesterday with 1.8 L UF, next HD tomorrow.  # Vascular access: She required excision of her left upper arm AV graft with vein patch angioplasty of left brachial artery after developing steal symptoms.  Currently using tunneled HD catheter.  # Anemia: With recent EGD that did not show any evidence of bleeding.  On chronic Eliquis  for history of renal vein thrombosis-dose adjusted to 2.5 mg twice daily.  Hemoglobin and hematocrit appear stable.  # CKD-MBD: Calcium  and phosphorus levels currently at goal, continue to monitor on renal diet and sevelamer .  PTH at goal.  # Nutrition: Continue renal diet with fluid restriction and ongoing protein supplementation.  # Hypertension: Blood pressures currently elevated, continue to monitor with hemodialysis/UF.  Subjective: Seen and examined.  Denies nausea, vomiting, chest pain, shortness of breath.  Doing rehab.  No new event. Objective Vital signs in last 24 hours: Vitals:   07/29/23 1814 07/29/23 2028 07/30/23 0500 07/30/23 0518  BP: (!) 158/64 (!) 147/54  133/62  Pulse: 91 85  77  Resp: 16 17  17   Temp: 98.2 F (36.8 C) 98.6 F (37 C)  98.7 F (37.1 C)  TempSrc: Oral Oral  Oral  SpO2: 99% 100%  98%  Weight:   74.5 kg   Height:       Weight change: 2.5  kg  Intake/Output Summary (Last 24 hours) at 07/30/2023 1059 Last data filed at 07/30/2023 0935 Gross per 24 hour  Intake 590 ml  Output 1.8 ml  Net 588.2 ml       Labs: RENAL PANEL Recent Labs  Lab 07/24/23 0827 07/26/23 1335 07/29/23 1407  NA 136 136 139  K 4.8 4.7 5.6*  CL 98 96* 100  CO2 25 24 22   GLUCOSE 164* 153* 108*  BUN 62* 68* 87*  CREATININE 6.46* 6.90* 7.66*  CALCIUM  9.7 9.6 9.7  PHOS 3.8 3.3 2.9  ALBUMIN  2.2* 2.4* 2.5*    Liver Function Tests: Recent Labs  Lab 07/24/23 0827 07/26/23 1335 07/29/23 1407  ALBUMIN  2.2* 2.4* 2.5*   No results for input(s): "LIPASE", "AMYLASE" in the last 168 hours. No results for input(s): "AMMONIA" in the last 168 hours. CBC: Recent Labs    01/31/23 1437 02/13/23 1332 02/28/23 1411 03/14/23 1450 04/04/23 1445 04/04/23 1446 04/18/23 1433 04/18/23 1434 05/08/23 1833 05/13/23 0358 05/13/23 0535 05/14/23 0802 05/28/23 0459 05/29/23 0735 06/17/23 0330 06/18/23 0334 07/22/23 1044 07/24/23 0827 07/25/23 0655 07/26/23 1336 07/29/23 1406  HGB 10.2*   < > 9.9*   < > 9.9*  --   --  9.4*   < >  --   --    < > 7.9*   < > 8.7*   < > 8.0* 7.3* 7.3* 7.4* 7.5*  MCV 87.7   < > 85.2   < > 85.9  --   --  86.4   < >  --   --    < >  87.1   < > 89.4   < > 94.0 95.7 94.2 95.8 95.5  VITAMINB12 1,098*  --  833  --  700  --   --  835  --  1,040*  --   --   --   --   --   --   --   --   --   --   --   FOLATE  --   --   --   --   --   --   --   --   --   --  5.3*  --   --   --   --   --   --   --   --   --   --   FERRITIN  --   --   --   --   --  153 226  --   --  220  --   --  246  --  192  --   --   --   --   --   --   TIBC  --   --   --   --   --   --   --   --   --  206*  --   --  207*  --  267  --   --   --   --   --   --   IRON   --   --   --   --   --   --   --   --   --  46  --   --  73  --  40  --   --   --   --   --   --   RETICCTPCT  --   --   --   --   --   --   --   --   --  1.5  --   --   --   --   --   --   --   --    --   --   --    < > = values in this interval not displayed.    Cardiac Enzymes: No results for input(s): "CKTOTAL", "CKMB", "CKMBINDEX", "TROPONINI" in the last 168 hours. CBG: No results for input(s): "GLUCAP" in the last 168 hours.  Iron  Studies: No results for input(s): "IRON ", "TIBC", "TRANSFERRIN", "FERRITIN" in the last 72 hours. Studies/Results: No results found.  Medications: Infusions:  cefTAZidime  (FORTAZ )  IV Stopped (07/30/23 0807)   vancomycin  750 mg (07/29/23 1624)    Scheduled Medications:  apixaban   2.5 mg Oral BID   B-complex with vitamin C  1 tablet Oral Daily   Chlorhexidine  Gluconate Cloth  6 each Topical Q12H   cholecalciferol   1,000 Units Oral Daily   colestipol   1 g Oral TID AC   darbepoetin (ARANESP ) injection - DIALYSIS  200 mcg Subcutaneous Q Sat-1800   diclofenac   1 patch Transdermal BID   FLUoxetine   20 mg Oral Daily   gabapentin   300 mg Oral QHS   kidney failure book   Does not apply Once   l-methylfolate-B6-B12  1 tablet Oral Daily   lipase/protease/amylase  36,000 Units Oral TID AC   memantine   5 mg Oral Daily   mirtazapine   7.5 mg Oral QHS   multivitamin  1 tablet Oral QHS   pantoprazole   40 mg Oral BID   rivastigmine   1.5  mg Oral BID   senna  2 tablet Oral Daily   sevelamer  carbonate  1,600 mg Oral TID WC   sodium chloride  flush  10-40 mL Intracatheter Q12H   torsemide   100 mg Oral Once per day on Sunday Tuesday Thursday Saturday    have reviewed scheduled and prn medications.  Physical Exam: General:NAD, comfortable Heart:RRR, s1s2 nl Lungs:clear b/l, no crackle Abdomen:soft, Non-tender, non-distended Extremities:No edema Dialysis Access: TDC, left AV graft excised.  Jannette Cotham Prasad Demareon Coldwell 07/30/2023,10:59 AM  LOS: 22 days

## 2023-07-30 NOTE — Progress Notes (Signed)
 Physical Therapy Session Note  Patient Details  Name: [name] Dettmer MRN: [id number] Date of Birth: 07/29/1945  Today's Date: 07/30/2023 PT Individual Time: 5621-3086 PT Individual Time Calculation (min): 27 min   Short Term Goals: Week 2:  PT Short Term Goal 1 (Week 2): STG=LTG due to LOS Week 3:  PT Short Term Goal 1 (Week 3): STG=LTG due to extended LOS (now pending SNF placement)  Skilled Therapeutic Interventions/Progress Updates:   Received pt semi-reclined in bed with RN attending to care. Pt agreeable to PT treatment and reported pain in R knee (premedicated). Session with emphasis on functional mobility/transfers, dressing, generalized strengthening and endurance, and dynamic standing balance/coordination. Pt transferred supine<>sitting R EOB from flat bed with supervision and increased time. Donned R knee sleeve with mod A and socks/shoes with max A. Pt donned pants sitting EOB with supervision and removed nightgown and donned bra and pull over sweatshirt with mod A. Stood from EOB with RW and CGA and required mod A to pull pants over hips - performed stand<>pivot into WC with RW and CGA and sat in WC at sink and washed face/applied lotions with set up assist. Concluded session with pt sittign in [facility], needs within reach, and seatbelt alarm on.   Therapy Documentation Precautions:  Precautions Precautions: Fall Precaution/Restrictions Comments: dementia Restrictions Weight Bearing Restrictions Per Provider Order: No  Therapy/Group: Individual Therapy [name] Nena Bank PT, DPT 07/30/2023, 6:56 AM

## 2023-07-30 NOTE — Progress Notes (Signed)
 Patient complaining of B/L hands being "numb." More on the right hand vs the left.  [name] [name]

## 2023-07-31 DIAGNOSIS — Z794 Long term (current) use of insulin: Secondary | ICD-10-CM

## 2023-07-31 DIAGNOSIS — R197 Diarrhea, unspecified: Secondary | ICD-10-CM

## 2023-07-31 DIAGNOSIS — E119 Type 2 diabetes mellitus without complications: Secondary | ICD-10-CM

## 2023-07-31 LAB — CBC WITH DIFFERENTIAL/PLATELET
Abs Immature Granulocytes: 0.07 10*3/uL (ref 0.00–0.07)
Basophils Absolute: 0 10*3/uL (ref 0.0–0.1)
Basophils Relative: 0 %
Eosinophils Absolute: 0.1 10*3/uL (ref 0.0–0.5)
Eosinophils Relative: 1 %
HCT: 25 % — ABNORMAL LOW (ref 36.0–46.0)
Hemoglobin: 7.4 g/dL — ABNORMAL LOW (ref 12.0–15.0)
Immature Granulocytes: 1 %
Lymphocytes Relative: 15 %
Lymphs Abs: 1.3 10*3/uL (ref 0.7–4.0)
MCH: 28.1 pg (ref 26.0–34.0)
MCHC: 29.6 g/dL — ABNORMAL LOW (ref 30.0–36.0)
MCV: 95.1 fL (ref 80.0–100.0)
Monocytes Absolute: 0.6 10*3/uL (ref 0.1–1.0)
Monocytes Relative: 7 %
Neutro Abs: 6.7 10*3/uL (ref 1.7–7.7)
Neutrophils Relative %: 76 %
Platelets: 395 10*3/uL (ref 150–400)
RBC: 2.63 MIL/uL — ABNORMAL LOW (ref 3.87–5.11)
RDW: 17.6 % — ABNORMAL HIGH (ref 11.5–15.5)
WBC: 8.9 10*3/uL (ref 4.0–10.5)
nRBC: 0.3 % — ABNORMAL HIGH (ref 0.0–0.2)

## 2023-07-31 LAB — RENAL FUNCTION PANEL
Albumin: 2.5 g/dL — ABNORMAL LOW (ref 3.5–5.0)
Anion gap: 13 (ref 5–15)
BUN: 83 mg/dL — ABNORMAL HIGH (ref 8–23)
CO2: 24 mmol/L (ref 22–32)
Calcium: 9.5 mg/dL (ref 8.9–10.3)
Chloride: 97 mmol/L — ABNORMAL LOW (ref 98–111)
Creatinine, Ser: 7.43 mg/dL — ABNORMAL HIGH (ref 0.44–1.00)
GFR, Estimated: 5 mL/min — ABNORMAL LOW (ref >60)
Glucose, Bld: 123 mg/dL — ABNORMAL HIGH (ref 70–99)
Phosphorus: 3.5 mg/dL (ref 2.5–4.6)
Potassium: 5.4 mmol/L — ABNORMAL HIGH (ref 3.5–5.1)
Sodium: 134 mmol/L — ABNORMAL LOW (ref 135–145)

## 2023-07-31 MED ORDER — PROCHLORPERAZINE MALEATE 5 MG PO TABS: 5.0000 mg | ORAL_TABLET | Freq: Four times a day (QID) | ORAL | Status: AC | PRN

## 2023-07-31 MED ORDER — POLYETHYLENE GLYCOL 3350 17 G PO PACK: 17.0000 g | PACK | Freq: Every day | ORAL | Status: DC | PRN

## 2023-07-31 MED ORDER — ORAL CARE MOUTH RINSE: 15.0000 mL | OROMUCOSAL | Status: AC | PRN

## 2023-07-31 MED ORDER — OXYCODONE HCL 5 MG PO TABS: 5.0000 mg | ORAL_TABLET | Freq: Four times a day (QID) | ORAL | 0 refills | Status: AC | PRN

## 2023-07-31 MED ORDER — POLYETHYLENE GLYCOL 3350 17 G PO PACK
17.0000 g | PACK | Freq: Every day | ORAL | Status: DC
Start: 2023-07-31 — End: 2023-07-31

## 2023-07-31 NOTE — Progress Notes (Signed)
 Occupational Therapy Session Note  Patient Details  Name: [name] MRN: [id number] Date of Birth: [birth year]  Today's Date: 07/31/2023 OT Individual Time: 9528-4132 OT Individual Time Calculation (min): 45 min    Short Term Goals: Week 1:  OT Short Term Goal 1 (Week 1): Pt will complete sit > stand in prep for ADL with CGA using LRAD OT Short Term Goal 1 - Progress (Week 1): Not met OT Short Term Goal 2 (Week 1): Pt will complete toilet transfer with CGA using LRAD OT Short Term Goal 2 - Progress (Week 1): Not met OT Short Term Goal 3 (Week 1): Pt will initiate toileting need with min questioning cues OT Short Term Goal 3 - Progress (Week 1): Met OT Short Term Goal 4 (Week 1): Pt will complete 1/3 toileting steps with min A for balance OT Short Term Goal 4 - Progress (Week 1): Met Week 2:  OT Short Term Goal 1 (Week 2): Pt will complete sit > stand in prep for ADL with CGA using LRAD OT Short Term Goal 1 - Progress (Week 2): Not met OT Short Term Goal 2 (Week 2): Pt will complete toilet transfer with CGA using LRAD OT Short Term Goal 2 - Progress (Week 2): Not met OT Short Term Goal 3 (Week 2): Pt will follow 1 step commands to complete LB dressing with min A OT Short Term Goal 3 - Progress (Week 2): Met Week 3:  OT Short Term Goal 1 (Week 3): STG = LTG due to ELOS OT Short Term Goal 1 - Progress (Week 3): Progressing toward goal  Skilled Therapeutic Interventions/Progress Updates:    1:1 Pt received sitting the TIS w/c and ready to participate in therapy. Pt reported already getting bathed and dressed today . Pt did report she wanted to don her knee sleeve on her left LE. Total A to don and pull up. Pt able to pull up pants after making adjustments. Pt ambulated from her room to the dayroom with RW with contact guard at a slower speed. Pt does tend to pick up RW with advancement of the RW but with cues for pt to push it forward for energy conservation. Pt ambulated from room to  Nustep in the dayroom. Pt set goal to use the Nustep for 15 min - on level 3 resistance. Pt performed transfers today with contact guard with extra time with RW. Pt does have difficulty with terminal bilateral knee extension.   Pt left sitting up in the w/c with safety belt donned and call bell in hand.   Therapy Documentation Precautions:  Precautions Precautions: Fall Precaution/Restrictions Comments: dementia Restrictions Weight Bearing Restrictions Per Provider Order: No  Pain:  No reports of pain in session   Therapy/Group: Individual Therapy  [name] [facility] 07/31/2023, 12:37 PM

## 2023-07-31 NOTE — Progress Notes (Signed)
 Occupational Therapy Session Note  Patient Details  Name: Sophiamarie Swaziland Morell MRN: [id number] Date of Birth: 1946/01/07  Today's Date: 07/31/2023 OT Individual Time: 1330-1425 OT Individual Time Calculation (min): 55 min    Short Term Goals: Week 3:  OT Short Term Goal 1 (Week 3): STG = LTG due to ELOS OT Short Term Goal 1 - Progress (Week 3): Progressing toward goal  Skilled Therapeutic Interventions/Progress Updates:    Skilled OT intervention with focus on functional amb with RW, toileting, and table activities to increase independence with BADLs and prepare for discharge tomorrow. Toileting with mod A. Amb with RW to bathroom with CGA. Amb with RW in hallway with CGA. Table activities replicating pattern with peg board and using tweezers to place and remove pegs. Pt completed task without assistance/verbal cues. Pt also complete pattern replication task with ping pong ball (medium complexity) with min verbal cues/assistance to complete task. Pt returned to room and remained in w/c with belt alarm activated. All needs within reach. Granddaughter present.   Therapy Documentation Precautions:  Precautions Precautions: Fall Precaution/Restrictions Comments: dementia Restrictions Weight Bearing Restrictions Per Provider Order: No Pain:  Pt denies pain this afternoon    Therapy/Group: Individual Therapy  Doak Free 07/31/2023, 2:36 PM

## 2023-07-31 NOTE — Progress Notes (Addendum)
 Plan is for pt to d/c to SNF tomorrow per CSW. Pt will start at [facility] on Friday. Pt will need to arrive at 11:00 am to complete paperwork prior to treatment. Contacted pt's daughter, Josephus Nida, this morning to see if family would be available to meet pt at clinic to assist with paperwork if needed and to also hear about clinic. Will await response from daughter/family. HD arrangements updated on pt's AVS. Renal NP sent orders to clinic last week. Will update clinic and assist as needed.   Lauraine Polite Renal Navigator [phone]  Addendum at 11:27 am: Received response from daughter and daughter/family will plan to meet pt at first appt to assist with paperwork if needed. Clinic requesting new orders since previous orders were completed a week ago. Will contact renal NP to request new orders be sent to clinic.

## 2023-07-31 NOTE — Progress Notes (Signed)
Pre Hd 

## 2023-07-31 NOTE — Progress Notes (Signed)
 Patient ID: [name], female   DOB: 1945/03/22, 78 y.o.   MRN: [id number]  SW scheduled ambulance transport with [facility] for pick up at 11am tomorrow.   1421- SW spoke with pt  dtr [name] to inform on above. She asks about follow-up . SW shared will follow-up with SNF.   1422- SW left message for [facility] to discuss pt admission tomorrow,.  1431- SW spoke with [facility]Admissions with [facility] to discuss above. She will confirm RM# and Nurse report after d/c summary received.   Norval Been, MSW, LCSW Office: [phone] Cell: [phone] Fax: [phone]

## 2023-07-31 NOTE — Progress Notes (Signed)
 PROGRESS NOTE   Subjective/Complaints: No new complaints or concerns this morning.  Reports pain is under control.  Reports she slept okay. LBM yesterday small  ROS: +upper and lower extremity weakness- improved, +bowel incontinence, +right knee pain-limits therapy, continues but is betterr. Denies CP, SOB, abd pain, n/v/d/constipation +dizziness- improved   Objective:   No results found.     Recent Labs    07/29/23 1406  WBC 11.3*  HGB 7.5*  HCT 25.6*  PLT 419*    Recent Labs    07/29/23 1407  NA 139  K 5.6*  CL 100  CO2 22  GLUCOSE 108*  BUN 87*  CREATININE 7.66*  CALCIUM  9.7     Intake/Output Summary (Last 24 hours) at 07/31/2023 1505 Last data filed at 07/31/2023 6045 Gross per 24 hour  Intake 480 ml  Output --  Net 480 ml     Pressure Injury 07/29/23 Heel Left Deep Tissue Pressure Injury - Purple or maroon localized area of discolored intact skin or blood-filled blister due to damage of underlying soft tissue from pressure and/or shear. (Active)  07/29/23 1856  Location: Heel  Location Orientation: Left  Staging: Deep Tissue Pressure Injury - Purple or maroon localized area of discolored intact skin or blood-filled blister due to damage of underlying soft tissue from pressure and/or shear.  Wound Description (Comments):   Present on Admission: No    Physical Exam: Vital Signs Blood pressure (!) 164/72, pulse 89, temperature 98.1 F (36.7 C), temperature source Oral, resp. rate 14, height (P) 5\' 4"  (1.626 m), weight 71.2 kg, SpO2 100%.  Gen: no distress, normal appearing, laying in bed, appears comfortable HEENT: oral mucosa pink and moist, NCAT Cardio: Reg rate, no m/r/g appreciated Chest: CTAB, nonlabored breathing Abd: soft, non-distended, +BS throughout, nonTTP Ext/MsK: no edema peripherally, LUE with graft site at L elbow without significant erythema.  Neuro: Oriented to self, memory  deficits present, cranial nerves II through XII grossly intact, follows simple commands   Prior Exam Ext/MsK: no edema peripherally, LUE with graft site at L elbow without significant erythema.  R knee with sutures c/d/I, no significant swelling, no redness/warmth. L knee with less tenderness and no effusion.  Psychiatric:     Comments: Pt flat, cooperative Musculoskeletal:        General: Swelling and tenderness (left antecubital region) present.  Right knee swollen and tender    Cervical back: Normal range of motion.  Skin:    Comments: AVF site CDI. No erythema. Right heel boggy  Neurological:     Mental Status: She is alert.     Comments: Oriented to self only. Unable to recall age, DOB or place. Poor recall and decrease insight into deficits. She was able to follow simple motor commands. No CN deficits. Limited RUE d/t HD catheter. LUE limited d/t pain. Grip/wrist at least 3-4/5 bilaterally. BLE 3- to 3/5 HF, KE and 4/5 ADF/PF. Decreased LT from ankles down bilaterally. No abnl resting tone. DTR's tr to 1+, stable 5/13 Functional mobility: modA toileting     Assessment/Plan: 1. Functional deficits which require 3+ hours per day of interdisciplinary therapy in a comprehensive inpatient rehab setting. Physiatrist is  providing close team supervision and 24 hour management of active medical problems listed below. Physiatrist and rehab team continue to assess barriers to discharge/monitor patient progress toward functional and medical goals  Care Tool:  Bathing    Body parts bathed by patient: Right arm, Left arm, Chest, Abdomen, Front perineal area, Right upper leg, Left upper leg, Right lower leg, Left lower leg, Face   Body parts bathed by helper: Buttocks     Bathing assist Assist Level: Minimal Assistance - Patient > 75%     Upper Body Dressing/Undressing Upper body dressing   What is the patient wearing?: Pull over shirt    Upper body assist Assist Level:  Supervision/Verbal cueing    Lower Body Dressing/Undressing Lower body dressing      What is the patient wearing?: Pants     Lower body assist Assist for lower body dressing: Minimal Assistance - Patient > 75%     Toileting Toileting    Toileting assist Assist for toileting: Moderate Assistance - Patient 50 - 74%     Transfers Chair/bed transfer  Transfers assist     Chair/bed transfer assist level: Contact Guard/Touching assist     Locomotion Ambulation   Ambulation assist      Assist level: Contact Guard/Touching assist Assistive device: Walker-rolling Max distance: 52'   Walk 10 feet activity   Assist  Walk 10 feet activity did not occur: Safety/medical concerns  Assist level: Minimal Assistance - Patient > 75% Assistive device: Walker-rolling   Walk 50 feet activity   Assist Walk 50 feet with 2 turns activity did not occur: Safety/medical concerns (pain)  Assist level: Minimal Assistance - Patient > 75% Assistive device: Walker-rolling    Walk 150 feet activity   Assist Walk 150 feet activity did not occur: Safety/medical concerns (pain)         Walk 10 feet on uneven surface  activity   Assist Walk 10 feet on uneven surfaces activity did not occur: Safety/medical concerns (pain)         Wheelchair     Assist Is the patient using a wheelchair?: Yes Type of Wheelchair: Manual    Wheelchair assist level: Supervision/Verbal cueing Max wheelchair distance: 125ft    Wheelchair 50 feet with 2 turns activity    Assist        Assist Level: Supervision/Verbal cueing   Wheelchair 150 feet activity     Assist      Assist Level: Supervision/Verbal cueing   Blood pressure (!) 164/72, pulse 89, temperature 98.1 F (36.7 C), temperature source Oral, resp. rate 14, height (P) 5\' 4"  (1.626 m), weight 71.2 kg, SpO2 100%.  Medical Problem List and Plan: 1. Functional deficits secondary to debility after acute  respiratory failure and multiple medical issues             -patient may not yet shower             -ELOS/Goals: 10-14 days, supervision goals with PT, OT, SLP  -continue CIR  Grounds pass ordered  Decrease to 15/7  Planning for SNF, discussed with daughter  -Team conference today please see physician documentation under team conference tab, met with team  to discuss problems,progress, and goals. Formulized individual treatment plan based on medical history, underlying problem and comorbidities.   2.  Antithrombotics: -DVT/anticoagulation:  Pharmaceutical: Eliquis  reduced to 2.5mg  BID as per GI recommendations given positive stool occult. (for renal artery thrombosis)             -  antiplatelet therapy: N/A 3. Acute on chronic LBP/Pain Management: Continue Hydrocodone  prn severe pain--monitor for sedation/confusion.              --Was on IV dilaudid  till 04/16-->now on hydrocodone  10 TID on average.              --increased use of Zanaflex  in past 48 hours.              --continue Gabapentin  300 mg/hs 4. Hx of depression/anxiety/Sleep: LCSW to follow for evaluation and support             --melatonin prn for insomnia.              -antipsychotic agents: N/A             -pt on chronic prozac  20mg  daily and remeron  7.5mg  nightly 5. Neuropsych/cognition: This patient is not capable of making decisions on her own behalf. 6. Skin/Wound Care: Routine pressure relief measures.              -float both heels 7. Fluids/Electrolytes/Nutrition: Strict I/O. Labs with HD             --1200 cc FR/day.  8. Cellulitis/Possible abscess LUE: On Van since 04/19 and Zosyn  since 04/17 --WBC trending down to 10.5 today. BC X 2 from 04/19 pending--negative so far.  --has been afebrile X 24 hours.  Zosyn  changed to Augmentin  by Dr. Sunnie England. End date for Vanc/augmentin  is 4/29              -pt reports that arm is feeling less tender -07/14/23 no fevers but now worsening leukocytosis, WBC 19.7 yesterday and 20.4 on  repeat today, LUE not really that concerning looking -continue vancomycin /ceftriaxone  -SEE BELOW #21-22  9. H/o GIB: Monitor H/H as had further drop. Hgb down to             On IV iron /ESA  -07/27/23 Hgb stable 7-8 range  10. HTN: Monitor BP TID--on Torsemide  on non dialysis days.  -07/20/23 [name] stable, apparently a little soft during dialysis, may need midodrine  on dialysis days per nephro; monitor -5/10-11/25 [name] fair, monitor  5/14 BP is little elevated today, continue to monitor trend   Vitals:   07/29/23 1600 07/29/23 1633 07/29/23 1700 07/29/23 1734  BP: (!) 117/58 119/63 122/65 132/66   07/29/23 1739 07/29/23 1814 07/29/23 2028 07/30/23 0518  BP: (!) 153/68 (!) 158/64 (!) 147/54 133/62   07/30/23 1401 07/30/23 2024 07/31/23 0520 07/31/23 1335  BP: (!) 135/114 (!) 121/53 (!) 150/63 (!) 164/72     11. 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 H/o Gastritis/esophagitis: Most recent admission last month. Continue Protonix  40mg  BID.  --Has required one unit PRBC 04/16.              --monitor for signs of bleeding.   13. Chronic diarrhea:  continue Colestid  1g TID and Creon  36kU tid- changed to after meals as per daughter's request, last BM 5/8 per documentation -07/28/23 no BM since 5/8 per documentation, but pt states 2days ago; monitor for now, but might need meds.  5/14 LBM yesterday, continue to monitor  14. H/o cognitive/memory deficits: continue Exelon  1.5mg  bid. Namenda  added  15. ESRD: Now HD dependent--TTS at the end of the day to help with tolerance of therapy             --continue renveal for metabolic bone disease.  Per nephro  -07/20/23 dialysis today, then MWF next week  to facilitate d/c  -07/27/23 dialysis yesterday, cont this schedule for now  5/14 reviewed nephrology note, HD today  17. T2DM: Hgb A1c- 6.2 and diet controlled. Add CM restrictions.  -d/c ISS, d/c CBGs - Monitor glucose on CBGs   18. Bilateral knee pain: flector  patch ordered for  right knee, right knee sleeve ordered, continue  -now s/p b/l aspirations by [name]  19. Bowel incontinence: lumbar MRI showed and discussed there is no evidence of cauda equina  20. New upper extremity weakness: cervical MRI ordered and discussed that there is moderate canal stenosis, discussed with daughter that oral steroids, steroid injection, surgery are possible interventions- deferred at this time given patient's risk factors  21. Right knee effusion: culture reviewed and with 89,000 WBC concerning for septic arthritis, will consult ID, procal elevated, s/p ortho washout, continue ceftriaxone /vanc -per ID, continue vanc, Fortaz  await culture results-- tx 4wks from I&D (4/28)   -07/21/23 intra-op cx NGTD x2d; both cx from knee aspirations NGTD x3d  22. Leukocytosis:repeat CBC ordered, procal ordered, consulted ID, continue Fortaz   Intra-op cultures reviewed and with NGTD final on 5/7  23. Anemia: Hgb reviewed and is stable, CT reviewed and there is no evidence of bleed, Eliquis  reduced to 2.5mg  BID as per GI recommendations, continue to monitor Hgb with HD  24. Dementia: added namenda  5mg  daily, continue  25. Neuropathic pain: added namenda  5mg  daily, metanx added, discussed with daughter, continue  16. Hypotension: will d/c tizanidine , resolved    LOS: 23 days A FACE TO FACE EVALUATION WAS PERFORMED  Lylia Sand 07/31/2023, 3:05 PM

## 2023-07-31 NOTE — Progress Notes (Signed)
 Physical Therapy Session Note  Patient Details  Name: [name] MRN: [id number] Date of Birth: 12-02-1945  Today's Date: 07/31/2023 PT Individual Time: 0816-0856 PT Individual Time Calculation (min): 40 min   Short Term Goals: Week 2:  PT Short Term Goal 1 (Week 2): STG=LTG due to LOS Week 3:  PT Short Term Goal 1 (Week 3): STG=LTG due to extended LOS (now pending SNF placement)  Skilled Therapeutic Interventions/Progress Updates:   Received pt semi-reclined in bed with granddaughter present to observe session. Pt agreeable to PT treatment and reported pain 9/10 in R knee - RN present to administer medications during session. Session with emphasis on functional mobility/transfers, dressing, generalized strengthening and endurance, dynamic standing balance/coordination, and ambulation. Pt transferred supine<>sitting R EOB from flat bed with mod I and increased time. Donned socks and doffed nightshirt and donned bra and pull over shirt with supervision but required min A to don jacket.   Pt performed all transfers with RW and CGA throughout session - cues for hand placement and anterior weight shifting. Stood to remove pajama pants and donned clean pants sitting EOB with supervision and shoes with max A.  Stood from EOB and required min A to pull pants over hips - transferred into WC via stand<>pivot with RW and CGA. Pt sat in WC at sink and washed face/applied lotions with set up assist. Concluded session with pt sitting in WC, needs within reach, and seatbelt alarm on.   Therapy Documentation Precautions:  Precautions Precautions: Fall Precaution/Restrictions Comments: dementia Restrictions Weight Bearing Restrictions Per Provider Order: No  Therapy/Group: Individual Therapy [name] Nena Bank PT, DPT 07/31/2023, 7:05 AM

## 2023-07-31 NOTE — Progress Notes (Signed)
Hd start 

## 2023-07-31 NOTE — Patient Care Conference (Signed)
 Inpatient RehabilitationTeam Conference and Plan of Care Update Date: 07/31/2023   Time: 11:55 AM    Patient Name: [name]      Medical Record Number: [id number]  Date of Birth: 15-Nov-1945 Sex: Female         Room/Bed: 4W13C/4W13C-01 Payor Info: Payor: [employer] MEDICARE / Plan: [employer] MEDICARE HMO / Product Type: *No Product type* /    Admit Date/Time:  07/08/2023  2:01 PM  Primary Diagnosis:  Debility  Hospital Problems: Principal Problem:   Debility Active Problems:   GERD (gastroesophageal reflux disease)   Type 2 diabetes mellitus (HCC)   Leukocytosis   Low back pain without sciatica   Spondylosis of lumbar spine   Iron  deficiency anemia   Malnutrition of moderate degree   History of DVT (deep vein thrombosis)   Acute on chronic anemia   Septic arthritis (HCC)   Pseudogout   Knee effusion   Hand steal syndrome, sequela   ESRD on dialysis [facility])    Expected Discharge Date: Expected Discharge Date: 08/01/23 (SNF)  Team Members Present: Physician leading conference: Dr. Lylia Sand Social Worker Present: Norval Been, LCSW Nurse Present: Forrestine Ike, RN PT Present: Nena Bank, PT OT Present: Celestino Cole, OT PPS Coordinator present : Jestine Moron, SLP     Current Status/Progress Goal Weekly Team Focus  Bowel/Bladder   Continent of bowel and oliguric of urine   Remain continet   Assist with toileting qshift and prn    Swallow/Nutrition/ Hydration               ADL's                Mobility   bed mobility mod I, transfers with RW min A to stand, CGA for stand<>pivot, gait 64ft with RW min A, 1 4in curb with RW min A, WC mobility 139ft supervision   supervision  barriers: knee pain, dementia, global weakness/deconditioning, cognitive impairments, decreased standing tolerance and balance.    Communication                Safety/Cognition/ Behavioral Observations               Pain   No c/o pain   Remain pain free    Assess qshift and prn    Skin   R knee incision ota with sutures   Maintain skin integrity  Assess qshift and prn      Discharge Planning:  Pt will d/c to SNF-[facility]. Dialysis in place for transfer.   Team Discussion: Currently awaiting SNF; limited functional progress noted due to dementia, and pain.  Patient on target to meet rehab goals: Currently needs mod assist for ADLs and able to transfer with CGA. Able to ambulate up to 55' with min assist and manage 1 step with min assist.  *See Care Plan and progress notes for long and short-term goals.   Revisions to Treatment Plan:  N/a   Teaching Needs: Safety, medications, skin care, dietary modifications, transfers, toileting, etc.   Current Barriers to Discharge: Decreased caregiver support, Home enviroment access/layout, Hemodialysis, and Behavior/Dementia  Possible Resolutions to Barriers: Family education  Pt will start at [facility] on Friday.      Medical Summary Current Status: right knee pain, dementia, anemia  Barriers to Discharge: Medical stability  Barriers to Discharge Comments: right knee pain, dementia, anemia Possible Resolutions to [employer] Focus: continue namenda , oxycodone , flextor patch, knee sleeve, s/p washout by ortho, Eliquis  decreased to 2,5mg  BID after  consultation with GI and nephrology, continue to monitor Hgb   Continued Need for Acute Rehabilitation Level of Care: The patient requires daily medical management by a physician with specialized training in physical medicine and rehabilitation for the following reasons: Direction of a multidisciplinary physical rehabilitation program to maximize functional independence : Yes Medical management of patient stability for increased activity during participation in an intensive rehabilitation regime.: Yes Analysis of laboratory values and/or radiology reports with any subsequent need for medication adjustment and/or medical intervention.  : Yes   I attest that I was present, lead the team conference, and concur with the assessment and plan of the team.   Forrestine Ike B 07/31/2023, 2:37 PM

## 2023-07-31 NOTE — Progress Notes (Signed)
 Occupational Therapy Discharge Summary  Patient Details  Name: [name] MRN: [id number] Date of Birth: 01/21/1946  Date of Discharge from OT service:Jul 31, 2023   Patient has met {NUMBERS 0-12:18577} of {NUMBERS 0-12:18577} long term goals due to improved balance and improved endurance.  Patient to discharge at overall min guard for ambulation/ transfers and mod A for LB dressing; setup for UB level.  Pt bathes at the sink . Patient's care partner unavailable to provide the necessary physical and cognitive assistance at discharge.    Reasons goals not met: ***  Recommendation:  Patient will benefit from ongoing skilled OT services in skilled nursing facility setting to continue to advance functional skills in the area of BADL and Reduce care partner burden.  Equipment: No equipment provided  Reasons for discharge: discharge from hospital  Patient/family agrees with progress made and goals achieved: Yes  OT Discharge Precautions/Restrictions    General   Vital Signs Therapy Vitals Temp: 98.1 F (36.7 C) Temp Source: Oral Pulse Rate: 89 Resp: 14 BP: (!) 164/72 Patient Position (if appropriate): Sitting Oxygen Therapy SpO2: 100 % O2 Device: Room Air Pain   ADL ADL Eating: Minimal assistance Where Assessed-Eating: Wheelchair Grooming: Minimal assistance Where Assessed-Grooming: Wheelchair Upper Body Bathing: Supervision/safety Where Assessed-Upper Body Bathing: Sitting at sink Lower Body Bathing: Minimal assistance Where Assessed-Lower Body Bathing: Sitting at sink, Standing at sink Upper Body Dressing: Supervision/safety Where Assessed-Upper Body Dressing: Wheelchair Lower Body Dressing: Minimal assistance Where Assessed-Lower Body Dressing: Wheelchair Toileting: Moderate assistance Where Assessed-Toileting: Teacher, adult education: Furniture conservator/restorer Method: Proofreader: Engineer, technical sales: Unable to  assess Tub/Shower Transfer Method: Unable to assess Film/video editor: Unable to assess Film/video editor Method: Unable to assess Vision Baseline Vision/History: 0 No visual deficits Perception  Perception: Within Training and development officer Comments: improved Cognition Cognition Overall Cognitive Status: History of cognitive impairments - at baseline Arousal/Alertness: Awake/alert Orientation Level: Person;Place;Situation Person: Oriented Place: Oriented Situation: Disoriented Memory: Impaired Awareness: Impaired Awareness Impairment: Intellectual impairment Safety/Judgment: Impaired Brief Interview for Mental Status (BIMS) Repetition of Three Words (First Attempt): 3 Temporal Orientation: Year: Correct Temporal Orientation: Month: Accurate within 5 days Temporal Orientation: Day: Incorrect Recall: "Sock": Yes, no cue required Recall: "Blue": Yes, no cue required Recall: "Bed": Yes, no cue required BIMS Summary Score: 14 Sensation Sensation Light Touch: Appears Intact Proprioception: Appears Intact Coordination Gross Motor Movements are Fluid and Coordinated: No Fine Motor Movements are Fluid and Coordinated: Yes Finger Nose Finger Test: dysmetria bilaterally Heel Shin Test: uncoordinated and decreased ROM bilaterally Motor  Motor Motor - Discharge Observations: impacked by pain Mobility  Bed Mobility Rolling Right: Independent with assistive device Rolling Left: Independent with assistive device Supine to Sit: Independent with assistive device Transfers Sit to Stand: Contact Guard/Touching assist Stand to Sit: Contact Guard/Touching assist  Trunk/Postural Assessment  Cervical Assessment Cervical Assessment:  (forward head) Thoracic Assessment Thoracic Assessment:  (rounded shoulders) Lumbar Assessment Lumbar Assessment: Exceptions to [facility] (posterior pelvic tilt) Postural Control Righting Reactions: delayed Protective Responses:  delayed  Balance Static Sitting Balance Static Sitting - Level of Assistance: 6: Modified independent (Device/Increase time) Dynamic Sitting Balance Dynamic Sitting - Balance Support: Feet supported;No upper extremity supported Dynamic Sitting - Level of Assistance: 5: Stand by assistance Static Standing Balance Static Standing - Level of Assistance: 5: Stand by assistance Dynamic Standing Balance Dynamic Standing - Level of Assistance: 5: Stand by assistance Extremity/Trunk Assessment RUE Assessment Active Range of Motion (AROM) Comments: Limited  to 90 degrees SF, WFL distally General Strength Comments: 3-/5 grossly LUE Assessment Active Range of Motion (AROM) Comments: Limited to 20 degrees SF, WFL distally but limited by pain General Strength Comments: 3-/5 distally   [name] 07/31/2023, 3:36 PM

## 2023-07-31 NOTE — Progress Notes (Signed)
 Physical Therapy Session Note  Patient Details  Name: [name] MRN: [id number] Date of Birth: 1946/02/15  Today's Date: 07/30/2023 PT Individual Time: 0851- 0917 PT Individual Time Calculation (min): 26 min  Short Term Goals: Week 1:  PT Short Term Goal 1 (Week 1): pt will transfer bed<>chair with LRAD and CGA PT Short Term Goal 1 - Progress (Week 1): Progressing toward goal PT Short Term Goal 2 (Week 1): pt will transfer sit<>stand with LRAD and CGA PT Short Term Goal 2 - Progress (Week 1): Progressing toward goal PT Short Term Goal 3 (Week 1): pt will ambulate 56ft with LRAD and CGA PT Short Term Goal 3 - Progress (Week 1): Progressing toward goal Week 2:  PT Short Term Goal 1 (Week 2): STG=LTG due to LOS Week 3:  PT Short Term Goal 1 (Week 3): STG=LTG due to extended LOS (now pending SNF placement)  Skilled Therapeutic Interventions/Progress Updates:  Patient seated upright in w/c on entrance to room. Patient alert and agreeable to PT session.   Patient with no pain complaint at start of session.  Therapeutic Activity: Transfers: Pt performed sit<>stand transfers throughout session with close supervision/ CGA. Provided vc/ tc for hand placement to w/c armrests, increasing forward lean for appropriate shift of weight over feet.   Gait Training:  Pt ambulated 48' x1/ 27' x1/ 15' x1 using RW with CGA/ supervision. Demonstrated slow but consistent pace with minimal step through with LLE. Provided vc for visual target to use for maintaining amb when pt initially requests to sit.   Wheelchair Mobility:  Pt propelled wheelchair 20 feet into room and is able to back into narrow space next to bed requiring vc for directionality and technique to complete turn to avoid obstacle behind pt.    Patient seated upright in w/c at end of session with brakes locked, belt alarm set, and all needs within reach.   Therapy Documentation Precautions:  Precautions Precautions:  Fall Precaution/Restrictions Comments: dementia Restrictions Weight Bearing Restrictions Per Provider Order: No  Pain:  Pain related with WB to RLE 2/2 severe knee OA. Chronic for pt and improves with open chain movement and decreasing weight bearing.    Therapy/Group: Individual Therapy  Donne Gage PT, DPT, CSRS 07/30/2023, 6:37 PM

## 2023-07-31 NOTE — Progress Notes (Signed)
 [facility] NEPHROLOGY PROGRESS NOTE  Assessment/ Plan: Pt is a 78 y.o. yo female   # Functional debility/deficits status post acute hypoxic respiratory failure/uremia: Currently admitted to inpatient rehabilitation unit for intensive physical therapy and Occupational Therapy.  Plans noted now for discharge to skilled nursing facility (which in turn may likely affect dialysis unit assignment).  # New start ESRD: With recent frequent hospitalizations for volume overload.  Ongoing hemodialysis on MWF schedule via TDC (placed 06/21/2023 by Dr. Vikki Graves). Outpatient dialysis to begin at [facility]. Plan for dialysis today.  # Vascular access: She required excision of her left upper arm AV graft with vein patch angioplasty of left brachial artery after developing steal symptoms.  Currently using tunneled HD catheter.  # Anemia: With recent EGD that did not show any evidence of bleeding.  On chronic Eliquis  for history of renal vein thrombosis-dose adjusted to 2.5 mg twice daily.  Hemoglobin and hematocrit appear stable.  # CKD-MBD: Calcium  and phosphorus levels currently at goal, continue to monitor on renal diet and sevelamer .  PTH at goal.  # Nutrition: Continue renal diet with fluid restriction and ongoing protein supplementation.  # Hypertension: Blood pressures currently elevated, continue to monitor with hemodialysis/UF.  Subjective: Seen and examined.  Denies nausea, vomiting, chest pain, shortness of breath.  No new event. Objective Vital signs in last 24 hours: Vitals:   07/30/23 1401 07/30/23 2024 07/31/23 0500 07/31/23 0520  BP: (!) 135/114 (!) 121/53  (!) 150/63  Pulse: 83 92  77  Resp: 16 18  17   Temp: 98.8 F (37.1 C) 99.4 F (37.4 C)  98.9 F (37.2 C)  TempSrc: Oral Oral  Oral  SpO2: 98% 97%  100%  Weight:   71.2 kg   Height:       Weight change: -3.3 kg  Intake/Output Summary (Last 24 hours) at 07/31/2023 1151 Last data filed at 07/31/2023  0738 Gross per 24 hour  Intake 720 ml  Output --  Net 720 ml       Labs: RENAL PANEL Recent Labs  Lab 07/26/23 1335 07/29/23 1407  NA 136 139  K 4.7 5.6*  CL 96* 100  CO2 24 22  GLUCOSE 153* 108*  BUN 68* 87*  CREATININE 6.90* 7.66*  CALCIUM  9.6 9.7  PHOS 3.3 2.9  ALBUMIN  2.4* 2.5*    Liver Function Tests: Recent Labs  Lab 07/26/23 1335 07/29/23 1407  ALBUMIN  2.4* 2.5*   No results for input(s): "LIPASE", "AMYLASE" in the last 168 hours. No results for input(s): "AMMONIA" in the last 168 hours. CBC: Recent Labs    01/31/23 1437 02/13/23 1332 02/28/23 1411 03/14/23 1450 04/04/23 1445 04/04/23 1446 04/18/23 1433 04/18/23 1434 05/08/23 1833 05/13/23 0358 05/13/23 0535 05/14/23 0802 05/28/23 0459 05/29/23 0735 06/17/23 0330 06/18/23 0334 07/22/23 1044 07/24/23 0827 07/25/23 0655 07/26/23 1336 07/29/23 1406  HGB 10.2*   < > 9.9*   < > 9.9*  --   --  9.4*   < >  --   --    < > 7.9*   < > 8.7*   < > 8.0* 7.3* 7.3* 7.4* 7.5*  MCV 87.7   < > 85.2   < > 85.9  --   --  86.4   < >  --   --    < > 87.1   < > 89.4   < > 94.0 95.7 94.2 95.8 95.5  VITAMINB12 1,098*  --  833  --  700  --   --  835  --  1,040*  --   --   --   --   --   --   --   --   --   --   --   FOLATE  --   --   --   --   --   --   --   --   --   --  5.3*  --   --   --   --   --   --   --   --   --   --   FERRITIN  --   --   --   --   --  153 226  --   --  220  --   --  246  --  192  --   --   --   --   --   --   TIBC  --   --   --   --   --   --   --   --   --  206*  --   --  207*  --  267  --   --   --   --   --   --   IRON   --   --   --   --   --   --   --   --   --  46  --   --  73  --  40  --   --   --   --   --   --   RETICCTPCT  --   --   --   --   --   --   --   --   --  1.5  --   --   --   --   --   --   --   --   --   --   --    < > = values in this interval not displayed.    Cardiac Enzymes: No results for input(s): "CKTOTAL", "CKMB", "CKMBINDEX", "TROPONINI" in the last 168  hours. CBG: No results for input(s): "GLUCAP" in the last 168 hours.  Iron  Studies: No results for input(s): "IRON ", "TIBC", "TRANSFERRIN", "FERRITIN" in the last 72 hours. Studies/Results: No results found.  Medications: Infusions:  cefTAZidime  (FORTAZ )  IV 0.5 g (07/30/23 2225)   vancomycin  750 mg (07/29/23 1624)    Scheduled Medications:  apixaban   2.5 mg Oral BID   B-complex with vitamin C  1 tablet Oral Daily   Chlorhexidine  Gluconate Cloth  6 each Topical Q12H   Chlorhexidine  Gluconate Cloth  6 each Topical Q0600   cholecalciferol   1,000 Units Oral Daily   colestipol   1 g Oral TID AC   darbepoetin (ARANESP ) injection - DIALYSIS  200 mcg Subcutaneous Q Sat-1800   diclofenac   1 patch Transdermal BID   FLUoxetine   20 mg Oral Daily   gabapentin   300 mg Oral QHS   kidney failure book   Does not apply Once   l-methylfolate-B6-B12  1 tablet Oral Daily   lipase/protease/amylase  36,000 Units Oral TID AC   memantine   5 mg Oral Daily   mirtazapine   7.5 mg Oral QHS   multivitamin  1 tablet Oral QHS   pantoprazole   40 mg Oral BID   rivastigmine   1.5 mg Oral BID   senna  2 tablet Oral Daily   sevelamer   carbonate  1,600 mg Oral TID WC   sodium chloride  flush  10-40 mL Intracatheter Q12H   torsemide   100 mg Oral Once per day on Sunday Tuesday Thursday Saturday    have reviewed scheduled and prn medications.  Physical Exam: General:NAD, comfortable Heart:RRR, s1s2 nl Lungs:clear b/l, no crackle Abdomen:soft, Non-tender, non-distended Extremities:No edema Dialysis Access: TDC, left AV graft excised.  Adaisha Campise Prasad Kaycee Haycraft 07/31/2023,11:51 AM  LOS: 23 days

## 2023-08-01 ENCOUNTER — Other Ambulatory Visit (HOSPITAL_COMMUNITY): Payer: Self-pay

## 2023-08-01 DIAGNOSIS — E440 Moderate protein-calorie malnutrition: Secondary | ICD-10-CM | POA: Diagnosis not present

## 2023-08-01 DIAGNOSIS — M1120 Other chondrocalcinosis, unspecified site: Secondary | ICD-10-CM | POA: Diagnosis not present

## 2023-08-01 DIAGNOSIS — R2689 Other abnormalities of gait and mobility: Secondary | ICD-10-CM | POA: Diagnosis not present

## 2023-08-01 DIAGNOSIS — I509 Heart failure, unspecified: Secondary | ICD-10-CM | POA: Diagnosis not present

## 2023-08-01 DIAGNOSIS — G8929 Other chronic pain: Secondary | ICD-10-CM | POA: Diagnosis not present

## 2023-08-01 DIAGNOSIS — F0390 Unspecified dementia without behavioral disturbance: Secondary | ICD-10-CM | POA: Diagnosis not present

## 2023-08-01 DIAGNOSIS — R5381 Other malaise: Secondary | ICD-10-CM | POA: Diagnosis not present

## 2023-08-01 DIAGNOSIS — E1122 Type 2 diabetes mellitus with diabetic chronic kidney disease: Secondary | ICD-10-CM | POA: Diagnosis not present

## 2023-08-01 DIAGNOSIS — L03114 Cellulitis of left upper limb: Secondary | ICD-10-CM | POA: Diagnosis not present

## 2023-08-01 DIAGNOSIS — D649 Anemia, unspecified: Secondary | ICD-10-CM | POA: Diagnosis not present

## 2023-08-01 DIAGNOSIS — N179 Acute kidney failure, unspecified: Secondary | ICD-10-CM | POA: Diagnosis not present

## 2023-08-01 DIAGNOSIS — M009 Pyogenic arthritis, unspecified: Secondary | ICD-10-CM | POA: Diagnosis not present

## 2023-08-01 DIAGNOSIS — T82898A Other specified complication of vascular prosthetic devices, implants and grafts, initial encounter: Secondary | ICD-10-CM | POA: Diagnosis not present

## 2023-08-01 DIAGNOSIS — I132 Hypertensive heart and chronic kidney disease with heart failure and with stage 5 chronic kidney disease, or end stage renal disease: Secondary | ICD-10-CM | POA: Diagnosis not present

## 2023-08-01 DIAGNOSIS — K529 Noninfective gastroenteritis and colitis, unspecified: Secondary | ICD-10-CM | POA: Diagnosis not present

## 2023-08-01 DIAGNOSIS — N2581 Secondary hyperparathyroidism of renal origin: Secondary | ICD-10-CM | POA: Diagnosis not present

## 2023-08-01 DIAGNOSIS — R41841 Cognitive communication deficit: Secondary | ICD-10-CM | POA: Diagnosis not present

## 2023-08-01 DIAGNOSIS — M11261 Other chondrocalcinosis, right knee: Secondary | ICD-10-CM | POA: Diagnosis not present

## 2023-08-01 DIAGNOSIS — E872 Acidosis, unspecified: Secondary | ICD-10-CM | POA: Diagnosis not present

## 2023-08-01 DIAGNOSIS — N185 Chronic kidney disease, stage 5: Secondary | ICD-10-CM | POA: Diagnosis not present

## 2023-08-01 DIAGNOSIS — M0080 Arthritis due to other bacteria, unspecified joint: Secondary | ICD-10-CM | POA: Diagnosis not present

## 2023-08-01 DIAGNOSIS — Z7401 Bed confinement status: Secondary | ICD-10-CM | POA: Diagnosis not present

## 2023-08-01 DIAGNOSIS — G894 Chronic pain syndrome: Secondary | ICD-10-CM | POA: Diagnosis not present

## 2023-08-01 DIAGNOSIS — N186 End stage renal disease: Secondary | ICD-10-CM | POA: Diagnosis not present

## 2023-08-01 DIAGNOSIS — F324 Major depressive disorder, single episode, in partial remission: Secondary | ICD-10-CM | POA: Diagnosis not present

## 2023-08-01 DIAGNOSIS — Z992 Dependence on renal dialysis: Secondary | ICD-10-CM | POA: Diagnosis not present

## 2023-08-01 DIAGNOSIS — I129 Hypertensive chronic kidney disease with stage 1 through stage 4 chronic kidney disease, or unspecified chronic kidney disease: Secondary | ICD-10-CM | POA: Diagnosis not present

## 2023-08-01 DIAGNOSIS — E8779 Other fluid overload: Secondary | ICD-10-CM | POA: Diagnosis not present

## 2023-08-01 DIAGNOSIS — R0600 Dyspnea, unspecified: Secondary | ICD-10-CM | POA: Diagnosis not present

## 2023-08-01 DIAGNOSIS — M6281 Muscle weakness (generalized): Secondary | ICD-10-CM | POA: Diagnosis not present

## 2023-08-01 MED ORDER — CHLORHEXIDINE GLUCONATE CLOTH 2 % EX PADS
6.0000 | MEDICATED_PAD | Freq: Every day | CUTANEOUS | Status: DC
  Administered 2023-08-01: 6 via TOPICAL

## 2023-08-01 MED ORDER — DEXTROSE 5 % IV SOLN
0.5000 g | INTRAVENOUS | Status: AC
Start: 2023-08-01 — End: ?

## 2023-08-01 MED ORDER — HEPARIN SODIUM (PORCINE) 1000 UNIT/ML IJ SOLN: 4000.0000 [IU] | Freq: Once | INTRAMUSCULAR | Status: DC

## 2023-08-01 MED ORDER — HEPARIN SODIUM (PORCINE) 1000 UNIT/ML IJ SOLN
INTRAMUSCULAR | Status: AC
  Filled 2023-08-01: qty 4

## 2023-08-01 NOTE — Progress Notes (Signed)
 Patient ID: [name], female   DOB: 05-21-1945, 78 y.o.   MRN: [id number]   Discharge to [facility] pick up at 11am:  Rm#[id number]  Nurse Report# [phone]   Norval Been, MSW, LCSW Office: [phone] Cell: [phone] Fax: [phone]

## 2023-08-01 NOTE — Progress Notes (Signed)
 Physical Therapy Discharge Summary  Patient Details  Name: [name] MRN: [id number] Date of Birth: [birth year]  Date of Discharge from PT service:Jul 31, 2023  Patient has met 4 of 10 long term goals due to improved activity tolerance, improved balance, improved postural control, and increased strength.  Patient to discharge at a wheelchair level CGA. Patient's care partner unavailable to provide the necessary physical and cognitive assistance at discharge, therefore recommend D/C to SNF.   Reasons goals not met: Pt did not meet dynamic standing and ambulation goals of supervision as pt currently requires CGA due to pain, fatigue, and weakness/deconditioning as well as cues for safety and sequencing due to baseline dementia.   Recommendation:  Patient will benefit from ongoing skilled PT services in skilled nursing facility setting to continue to advance safe functional mobility, address ongoing impairments in transfers, generalized strengthening and endurance, dynamic standing balance/coordination, NMR, ambulation, and to minimize fall risk.  Equipment: No equipment provided - to be provided in next venue of care  Reasons for discharge: lack of progress toward goals, treatment goals met, and discharge from hospital  Patient/family agrees with progress made and goals achieved: Yes  PT Discharge Precautions/Restrictions Precautions Precautions: Fall Precaution/Restrictions Comments: baseline dementia Restrictions Weight Bearing Restrictions Per Provider Order: No Pain Interference Pain Interference Pain Effect on Sleep: 1. Rarely or not at all (question accurracy of answers due to dementia) Pain Interference with Therapy Activities: 1. Rarely or not at all Pain Interference with Day-to-Day Activities: 1. Rarely or not at all Cognition Overall Cognitive Status: History of cognitive impairments - at baseline Arousal/Alertness: Awake/alert Orientation Level: Oriented to  person;Oriented to place;Oriented to situation Memory: Impaired Awareness: Impaired Problem Solving: Impaired Safety/Judgment: Impaired Sensation Sensation Light Touch: Appears Intact Hot/Cold: Not tested Proprioception: Appears Intact Stereognosis: Not tested Coordination Gross Motor Movements are Fluid and Coordinated: No Fine Motor Movements are Fluid and Coordinated: No Coordination and Movement Description: uncoordinated due to pain, impaired motor planning/sequencing, and weakness/deconditioning Finger Nose Finger Test: dysmetria bilaterally Heel Shin Test: uncoordinated and decreased ROM bilaterally Motor  Motor Motor: Abnormal postural alignment and control;Other (comment) Motor - Skilled Clinical Observations: uncoordinated due to pain, impaired motor planning/sequencing, and weakness/deconditioning  Mobility Bed Mobility Bed Mobility: Rolling Right;Rolling Left;Sit to Supine;Supine to Sit Rolling Right: Independent with assistive device Rolling Left: Independent with assistive device Supine to Sit: Independent with assistive device Sit to Supine: Independent with assistive device Transfers Transfers: Stand to Sit;Stand Pivot Transfers;Sit to Stand Sit to Stand: Contact Guard/Touching assist Stand to Sit: Contact Guard/Touching assist Stand Pivot Transfers: Contact Guard/Touching assist Stand Pivot Transfer Details: Verbal cues for sequencing;Verbal cues for technique;Verbal cues for safe use of DME/AE Stand Pivot Transfer Details (indicate cue type and reason): verbal cues for sequencing and frequently reminding pt where she is going Transfer (Assistive device): Rolling walker Locomotion  Gait Ambulation: Yes Gait Assistance: Contact Guard/Touching assist Gait Distance (Feet): 70 Feet Assistive device: Rolling walker Gait Assistance Details: Verbal cues for gait pattern;Verbal cues for technique;Verbal cues for sequencing;Verbal cues for safe use of DME/AE Gait  Assistance Details: verbal cues for upright posture, knee extension, and RW safety Gait Gait: Yes Gait Pattern: Impaired Gait Pattern: Step-to pattern;Decreased step length - right;Decreased step length - left;Decreased stride length;Antalgic;Trunk flexed;Poor foot clearance - left;Poor foot clearance - right;Narrow base of support Gait velocity: decreased Stairs / Additional Locomotion Stairs: Yes Stairs Assistance: Contact Guard/Touching assist Stair Management Technique: With walker Number of Stairs: 1 Height of Stairs: 4 Curb:  Contact Guard/Touching Paediatric nurse: Yes Wheelchair Assistance: Doctor, general practice: Both upper extremities Wheelchair Parts Management: Needs assistance Distance: 1107ft  Trunk/Postural Assessment  Cervical Assessment Cervical Assessment: Exceptions to [facility] (forward head) Thoracic Assessment Thoracic Assessment: Exceptions to [facility] (thoracic rounding) Lumbar Assessment Lumbar Assessment: Exceptions to [facility] (posterior pelvic tilt) Postural Control Postural Control: Deficits on evaluation Righting Reactions: delayed Protective Responses: delayed  Balance Balance Balance Assessed: Yes Static Sitting Balance Static Sitting - Balance Support: Feet supported;Bilateral upper extremity supported Static Sitting - Level of Assistance: 6: Modified independent (Device/Increase time) Dynamic Sitting Balance Dynamic Sitting - Balance Support: Feet supported;No upper extremity supported Dynamic Sitting - Level of Assistance: 5: Stand by assistance (supervision) Static Standing Balance Static Standing - Balance Support: During functional activity;Bilateral upper extremity supported (RW) Static Standing - Level of Assistance: 5: Stand by assistance (CGA) Dynamic Standing Balance Dynamic Standing - Balance Support: Bilateral upper extremity supported;During functional activity (RW) Dynamic Standing - Level  of Assistance: 5: Stand by assistance (CGA) Dynamic Standing - Comments: with transfers and ambulation Extremity Assessment  RLE Assessment RLE Assessment: Exceptions to [facility] General Strength Comments: tested sitting in WC - limited by pain in R knee RLE Strength Right Hip Flexion: 3-/5 Right Hip ABduction: 3+/5 Right Hip ADduction: 3+/5 Right Knee Flexion: 3+/5 Right Knee Extension: 3-/5 Right Ankle Dorsiflexion: 3+/5 Right Ankle Plantar Flexion: 3+/5 LLE Assessment LLE Assessment: Exceptions to [facility] General Strength Comments: tested sitting in WC LLE Strength Left Hip Flexion: 3+/5 Left Hip ABduction: 3+/5 Left Hip ADduction: 3+/5 Left Knee Flexion: 3+/5 Left Knee Extension: 3-/5 Left Ankle Dorsiflexion: 4-/5 Left Ankle Plantar Flexion: 4-/5   [name] Nena Bank PT, DPT 08/01/2023, 7:08 AM

## 2023-08-01 NOTE — Discharge Planning (Signed)
 [facility]  Initial Hemodialysis Orders   Dialysis center: [facility]   Patient's name: [name] DOB: 13-Aug-1945 AKI or ESRD: ESRD   Admission Date: 07/08/23 Anticipated Discharge Date: 07/31/22   Past Medical History: Anemia of CKD Hx recent GI bleed A-fib on Eliquis  HTN SLE T2DM Hx CVA Hx renal vein thrombosis   Discharge diagnosis: New ESRD; HD initiated Right knee Septic Arthritis - On IV ABX's see below Functional debility s/p acute hypoxic respiratory failure - received PT/OT on inpatient rehab unit     Allergies:  Allergies       Allergies  Allergen Reactions   Anesthetics, Halogenated Other (See Comments)      Per patient's daughter, she can not tolerate anesthetics.    Etomidate  Other (See Comments)      Heart stopped during anesthesia   Fentanyl  Other (See Comments)      Heart stopped during anesthesia     Percocet [Oxycodone -Acetaminophen ] Other (See Comments)      Headaches     Diazepam Other (See Comments)      Agitation   Lorazepam  Nausea Only   Tramadol  Hcl Nausea And Vomiting   Haloperidol Nausea And Vomiting      **Haldol**   Morphine  Sulfate Nausea Only      agitation   Propoxyphene Nausea And Vomiting      **Darvocet** no longer available   Versed  [Midazolam ] Other (See Comments)      Heart stopped during anesthesia        Date of First Dialysis: 1st HD 06/21/23 Cause of renal disease: HTN vs. Cardiorenal syndrome    Dialysis Prescription: Dialysis Frequency: MWF Tx duration: 3hrs          BFR: 350 then 400       DFR: 500 then 600 EDW: 72kg  Dialyzer: 180NRe UF profile/Sodium modeling?: None Dialysis Bath: 2 K/2 Ca   Dialysis access: R TDC placed by Dr. Vikki Graves on 06/21/23 S/p L AVG excised d/t steal syndrome   In Center Medications: Heparin  Dose: 2000 unit bolus with HD          VDRA: Calcitriol: per protocol Venofer : NO FE WHILE ON ABXs!!!!!!!! Aranesp  given on 5//25. Change to  Mircera: 225mcg every 2 weeks. Next dose due: 07/18/23 Review pharmacy note from 07/22/23: IV Vancomycin  750mg  and Ceftazidime  1GM with HD until 08/16/23   *Doesn't appear she received blood transfusion during this admission*   Discharge labs from 08/01/23: Hgb: 7.          K+: 5.4  Ca: 9.5 Phos: 3.      Alb: 2.5   Please obtain weekly Vanc troughs per protocol. See #5 for ABX orders!!!!!   Hersey Lorenzo, NP

## 2023-08-01 NOTE — Progress Notes (Signed)
   08/01/23 0145  Vitals  Temp 98.3 F (36.8 C)  Temp Source Oral  BP 120/62  MAP (mmHg) 78  BP Location Left Arm  BP Method Automatic  Patient Position (if appropriate) Lying  Pulse Rate 85  Pulse Rate Source Monitor  ECG Heart Rate 85  Resp 10  Oxygen Therapy  SpO2 100 %  O2 Device Room Air  During Treatment Monitoring  Blood Flow Rate (mL/min) 349 mL/min  Arterial Pressure (mmHg) -145.25 mmHg  Venous Pressure (mmHg) 177.77 mmHg  TMP (mmHg) 7.68 mmHg  Ultrafiltration Rate (mL/min) 645 mL/min  Dialysate Flow Rate (mL/min) 299 ml/min  Duration of HD Treatment -hour(s) 3.38 hour(s)  Cumulative Fluid Removed (mL) per Treatment  1174.29  HD Safety Checks Performed Yes  Intra-Hemodialysis Comments Tx completed  Post Treatment  Dialyzer Clearance Lightly streaked  Liters Processed 73.5  Fluid Removed (mL) 1300 mL  Tolerated HD Treatment Yes  Hemodialysis Catheter Right Internal jugular Double lumen Permanent (Tunneled)  Placement Date/Time: 06/21/23 1335   Serial / Lot #: [id number]  Expiration Date: 12/14/27  Time Out: Correct patient;Correct site;Correct procedure  Maximum sterile barrier precautions: Hand hygiene;Large sterile sheet;Cap;Sterile probe cover;Mask;St...  Site Condition No complications  Blue Lumen Status Flushed;Heparin  locked  Red Lumen Status Flushed;Heparin  locked  Catheter fill solution Heparin  1000 units/ml  Catheter fill volume (Arterial) 1.6 cc  Catheter fill volume (Venous) 1.6  Dressing Type Transparent  Dressing Status Clean, Dry, Intact  Drainage Description None  Dressing Change Due 08/06/23  Post treatment catheter status Capped and Clamped

## 2023-08-01 NOTE — Progress Notes (Signed)
 Inpatient Rehabilitation Discharge Medication Review by a Pharmacist  A complete drug regimen review was completed for this patient to identify any potential clinically significant medication issues.  High Risk Drug Classes Is patient taking? Indication by Medication  Antipsychotic Yes Prochlorperazine - nausea  Anticoagulant Yes Apixaban - renal artery thrombosis  Antibiotic Yes, as an intravenous medication Vancomycin  / ceftazidime - septic arthritis EOT 08/16/2023  Opioid Yes  OxyIR- severe pain  Antiplatelet No   Hypoglycemics/insulin  No   Vasoactive Medication Yes Torsemide - HTN  Chemotherapy No   Other Yes Acetaminophen -, Diclofenac  patch and topical gel - pain management, knee pain. Colestipol , Creon - chronic diarrhea Fluoxetine - MDD Gabapentin - neuropathic pain Melatonin- sleep Memantine - alzheimer's / dementia, neuropathic pain Protonix - history of gastritis, esophagitis Mirtazapine - MDD Senokot- bowel regimen/ constipation Tums (calcium  carbonate)-indigestion, heartburn Renvela - hyperphosphatemia 2/2 ESRD Vitamin B complex +C, Vitamin D , Metanx (B12), Rena-Vit - supplements       Type of Medication Issue Identified Description of Issue Recommendation(s)  Drug Interaction(s) (clinically significant)     Duplicate Therapy     Allergy     No Medication Administration End Date     Incorrect Dose     Additional Drug Therapy Needed     Significant med changes from prior encounter (inform family/care partners about these prior to discharge).    Other       Clinically significant medication issues were identified that warrant physician communication and completion of prescribed/recommended actions by midnight of the next day:  No   Time spent performing this drug regimen review (minutes):  30   Thank you for allowing pharmacy to be a part of this patient's care.   Alisa Irish, RPh Clinical Pharmacist Clinical Pharmacist 08/01/2023 9:17 AM

## 2023-08-01 NOTE — Plan of Care (Signed)
  Problem: RH Balance Goal: LTG Patient will maintain dynamic standing with ADLs (OT) Description: LTG:  Patient will maintain dynamic standing balance with assist during activities of daily living (OT)  Outcome: Completed/Met   Problem: Sit to Stand Goal: LTG:  Patient will perform sit to stand in prep for activites of daily living with assistance level (OT) Description: LTG:  Patient will perform sit to stand in prep for activites of daily living with assistance level (OT) Outcome: Completed/Met   Problem: RH Eating Goal: LTG Patient will perform eating w/assist, cues/equip (OT) Description: LTG: Patient will perform eating with assist, with/without cues using equipment (OT) Outcome: Completed/Met   Problem: RH Grooming Goal: LTG Patient will perform grooming w/assist,cues/equip (OT) Description: LTG: Patient will perform grooming with assist, with/without cues using equipment (OT) Outcome: Completed/Met   Problem: RH Bathing Goal: LTG Patient will bathe all body parts with assist levels (OT) Description: LTG: Patient will bathe all body parts with assist levels (OT) Outcome: Completed/Met   Problem: RH Dressing Goal: LTG Patient will perform upper body dressing (OT) Description: LTG Patient will perform upper body dressing with assist, with/without cues (OT). Outcome: Completed/Met Goal: LTG Patient will perform lower body dressing w/assist (OT) Description: LTG: Patient will perform lower body dressing with assist, with/without cues in positioning using equipment (OT) Outcome: Not Met (add Reason)   Problem: RH Toileting Goal: LTG Patient will perform toileting task (3/3 steps) with assistance level (OT) Description: LTG: Patient will perform toileting task (3/3 steps) with assistance level (OT)  Outcome: Not Met (add Reason)   Problem: RH Toilet Transfers Goal: LTG Patient will perform toilet transfers w/assist (OT) Description: LTG: Patient will perform toilet transfers  with assist, with/without cues using equipment (OT) Outcome: Completed/Met   Problem: RH Tub/Shower Transfers Goal: LTG Patient will perform tub/shower transfers w/assist (OT) Description: LTG: Patient will perform tub/shower transfers with assist, with/without cues using equipment (OT) Outcome: Not Applicable   Problem: RH Memory Goal: LTG Patient will demonstrate ability for day to day recall/carry over during activities of daily living with assistance level (OT) Description: LTG:  Patient will demonstrate ability for day to day recall/carry over during activities of daily living with assistance level (OT). Outcome: Completed/Met   Problem: RH Awareness Goal: LTG: Patient will demonstrate awareness during functional activites type of (OT) Description: LTG: Patient will demonstrate awareness during functional activites type of (OT) Outcome: Completed/Met

## 2023-08-01 NOTE — Progress Notes (Signed)
 Pt to d/c to snf today. Contacted [facility] to confirm pt's d/c today and that pt should start tomorrow. Clinic also advised that family plans to meet pt at clinic to assist with paperwork if needed. HD arrangements placed on AVS and provided to CSW to provide to snf. Renal NP to send new HD orders to clinic.   Lauraine Polite Renal Navigator [phone]

## 2023-08-01 NOTE — Progress Notes (Signed)
 PROGRESS NOTE   Subjective/Complaints: No new complaints this morning Patient's chart reviewed- No issues reported overnight BP elevated- nephrology managing  ROS: +upper and lower extremity weakness- improved, +bowel incontinence, +right knee pain-limits therapy, continues but is betterr. +dizziness- improved   Objective:   No results found.     Recent Labs    07/29/23 1406 07/31/23 2214  WBC 11.3* 8.9  HGB 7.5* 7.4*  HCT 25.6* 25.0*  PLT 419* 395    Recent Labs    07/29/23 1407 07/31/23 2214  NA 139 134*  K 5.6* 5.4*  CL 100 97*  CO2 22 24  GLUCOSE 108* 123*  BUN 87* 83*  CREATININE 7.66* 7.43*  CALCIUM  9.7 9.5     Intake/Output Summary (Last 24 hours) at 08/01/2023 1033 Last data filed at 08/01/2023 0900 Gross per 24 hour  Intake 347 ml  Output 1300 ml  Net -953 ml     Pressure Injury 07/29/23 Heel Left Deep Tissue Pressure Injury - Purple or maroon localized area of discolored intact skin or blood-filled blister due to damage of underlying soft tissue from pressure and/or shear. (Active)  07/29/23 1856  Location: Heel  Location Orientation: Left  Staging: Deep Tissue Pressure Injury - Purple or maroon localized area of discolored intact skin or blood-filled blister due to damage of underlying soft tissue from pressure and/or shear.  Wound Description (Comments):   Present on Admission: No    Physical Exam: Vital Signs Blood pressure (!) 165/76, pulse 95, temperature 98.9 F (37.2 C), temperature source Oral, resp. rate 14, height (P) 5\' 4"  (1.626 m), weight 74 kg, SpO2 100%.  Gen: no distress, normal appearing, laying in bed, appears comfortable HEENT: oral mucosa pink and moist, NCAT Cardio: Reg rate, no m/r/g appreciated Chest: CTAB, nonlabored breathing Abd: soft, non-distended, +BS throughout, nonTTP Ext/MsK: no edema peripherally, LUE with graft site at L elbow without significant  erythema.  Neuro: Oriented to self, memory deficits present, cranial nerves II through XII grossly intact, follows simple commands Ext/MsK: no edema peripherally, LUE with graft site at L elbow without significant erythema.  R knee with sutures c/d/I, no significant swelling, no redness/warmth. L knee with less tenderness and no effusion.  Psychiatric:     Comments: Pt flat, cooperative Musculoskeletal:        General: Swelling and tenderness (left antecubital region) present.  Right knee swollen and tender    Cervical back: Normal range of motion.  Skin:    Comments: AVF site CDI. No erythema. Right heel boggy  Neurological:     Mental Status: She is alert.     Comments: Oriented to self only. Unable to recall age, DOB or place. Poor recall and decrease insight into deficits. She was able to follow simple motor commands. No CN deficits. Limited RUE d/t HD catheter. LUE limited d/t pain. Grip/wrist at least 3-4/5 bilaterally. BLE 3- to 3/5 HF, KE and 4/5 ADF/PF. Decreased LT from ankles down bilaterally. No abnl resting tone. DTR's tr to 1+, stable 5/15 Functional mobility: modA toileting     Assessment/Plan: 1. Functional deficits which require 3+ hours per day of interdisciplinary therapy in a comprehensive inpatient rehab setting.  Physiatrist is providing close team supervision and 24 hour management of active medical problems listed below. Physiatrist and rehab team continue to assess barriers to discharge/monitor patient progress toward functional and medical goals  Care Tool:  Bathing    Body parts bathed by patient: Right arm, Left arm, Chest, Abdomen, Front perineal area, Right upper leg, Left upper leg, Right lower leg, Left lower leg, Face   Body parts bathed by helper: Buttocks     Bathing assist Assist Level: Minimal Assistance - Patient > 75%     Upper Body Dressing/Undressing Upper body dressing   What is the patient wearing?: Pull over shirt    Upper body assist  Assist Level: Supervision/Verbal cueing    Lower Body Dressing/Undressing Lower body dressing      What is the patient wearing?: Pants     Lower body assist Assist for lower body dressing: Minimal Assistance - Patient > 75%     Toileting Toileting    Toileting assist Assist for toileting: Moderate Assistance - Patient 50 - 74%     Transfers Chair/bed transfer  Transfers assist     Chair/bed transfer assist level: Contact Guard/Touching assist     Locomotion Ambulation   Ambulation assist      Assist level: Contact Guard/Touching assist Assistive device: Walker-rolling Max distance: 56ft   Walk 10 feet activity   Assist  Walk 10 feet activity did not occur: Safety/medical concerns  Assist level: Contact Guard/Touching assist Assistive device: Walker-rolling   Walk 50 feet activity   Assist Walk 50 feet with 2 turns activity did not occur: Safety/medical concerns (pain)  Assist level: Contact Guard/Touching assist Assistive device: Walker-rolling    Walk 150 feet activity   Assist Walk 150 feet activity did not occur: Safety/medical concerns (pain, fatigue)         Walk 10 feet on uneven surface  activity   Assist Walk 10 feet on uneven surfaces activity did not occur: Safety/medical concerns (pain, fatigue)         Wheelchair     Assist Is the patient using a wheelchair?: Yes Type of Wheelchair: Manual    Wheelchair assist level: Supervision/Verbal cueing Max wheelchair distance: 172ft    Wheelchair 50 feet with 2 turns activity    Assist        Assist Level: Supervision/Verbal cueing   Wheelchair 150 feet activity     Assist      Assist Level: Supervision/Verbal cueing   Blood pressure (!) 165/76, pulse 95, temperature 98.9 F (37.2 C), temperature source Oral, resp. rate 14, height (P) 5\' 4"  (1.626 m), weight 74 kg, SpO2 100%.  Medical Problem List and Plan: 1. Functional deficits secondary to  debility after acute respiratory failure and multiple medical issues             -patient may not yet shower             -ELOS/Goals: 10-14 days, supervision goals with PT, OT, SLP  D/c to SNF  Planning for SNF, discussed with daughter  -Team conference today please see physician documentation under team conference tab, met with team  to discuss problems,progress, and goals. Formulized individual treatment plan based on medical history, underlying problem and comorbidities.   2.  Antithrombotics: -DVT/anticoagulation:  Pharmaceutical: Eliquis  reduced to 2.5mg  BID as per GI recommendations given positive stool occult. (for renal artery thrombosis)             -antiplatelet therapy: N/A 3. Acute on chronic LBP/Pain  Management: Continue Hydrocodone  prn severe pain--monitor for sedation/confusion.              --Was on IV dilaudid  till 04/16-->now on hydrocodone  10 TID on average.              --increased use of Zanaflex  in past 48 hours.              --continue Gabapentin  300 mg/hs 4. Hx of depression/anxiety/Sleep: LCSW to follow for evaluation and support             --melatonin prn for insomnia.              -antipsychotic agents: N/A             -pt on chronic prozac  20mg  daily and remeron  7.5mg  nightly 5. Neuropsych/cognition: This patient is not capable of making decisions on her own behalf. 6. Skin/Wound Care: Routine pressure relief measures.              -float both heels 7. Fluids/Electrolytes/Nutrition: Strict I/O. Labs with HD             --1200 cc FR/day.  8. Cellulitis/Possible abscess LUE: On Van since 04/19 and Zosyn  since 04/17 --WBC trending down to 10.5 today. BC X 2 from 04/19 pending--negative so far.  --has been afebrile X 24 hours.  Zosyn  changed to Augmentin  by Dr. Sunnie England. End date for Vanc/augmentin  is 4/29              -pt reports that arm is feeling less tender -07/14/23 no fevers but now worsening leukocytosis, WBC 19.7 yesterday and 20.4 on repeat today, LUE not  really that concerning looking -continue vancomycin /ceftriaxone  -SEE BELOW #21-22  9. H/o GIB: Monitor H/H as had further drop. Hgb down to             On IV iron /ESA  -07/27/23 Hgb stable 7-8 range  10. HTN: Monitor BP TID--on Torsemide  on non dialysis days.  -07/20/23 [name] stable, apparently a little soft during dialysis, may need midodrine  on dialysis days per nephro; monitor -5/10-11/25 [name] fair, monitor  5/14 BP is little elevated today, continue to monitor trend   Vitals:   07/31/23 2215 07/31/23 2245 07/31/23 2255 07/31/23 2310  BP: (!) 159/64 (!) 142/66 131/74 (!) 101/54   07/31/23 2315 07/31/23 2345 08/01/23 0015 08/01/23 0045  BP: (!) 110/59 (!) 178/81 (!) 117/56 (!) 108/51   08/01/23 0115 08/01/23 0145 08/01/23 0236 08/01/23 0503  BP: (!) 110/55 120/62 (!) 163/83 (!) 165/76     11. 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 H/o Gastritis/esophagitis: Most recent admission last month. Continue Protonix  40mg  BID.  --Has required one unit PRBC 04/16.              --monitor for signs of bleeding.   13. Chronic diarrhea:  continue Colestid  1g TID and Creon  36kU tid- changed to after meals as per daughter's request, last BM 5/8 per documentation -07/28/23 no BM since 5/8 per documentation, but pt states 2days ago; monitor for now, but might need meds.  5/14 LBM yesterday, continue to monitor  14. H/o cognitive/memory deficits: continue Exelon  1.5mg  bid. Namenda  added  15. ESRD: Now HD dependent--TTS at the end of the day to help with tolerance of therapy             --continue renveal for metabolic bone disease.  Per nephro  -07/20/23 dialysis today, then MWF next week to facilitate d/c  -07/27/23 dialysis  yesterday, cont this schedule for now  5/14 reviewed nephrology note, HD today  17. T2DM: Hgb A1c- 6.2 and diet controlled. Add CM restrictions.  -d/c ISS, d/c CBGs - Monitor glucose on CBGs   18. Bilateral knee pain: flector  patch ordered for right knee,  right knee sleeve ordered, continue  -now s/p b/l aspirations by [name]  19. Bowel incontinence: lumbar MRI showed and discussed there is no evidence of cauda equina  20. New upper extremity weakness: cervical MRI ordered and discussed that there is moderate canal stenosis, discussed with daughter that oral steroids, steroid injection, surgery are possible interventions- deferred at this time given patient's risk factors  21. Right knee effusion: culture reviewed and with 89,000 WBC concerning for septic arthritis, will consult ID, procal elevated, s/p ortho washout, continue ceftriaxone /vanc -per ID, continue vanc, Fortaz  await culture results-- tx 4wks from I&D (4/28)   -07/21/23 intra-op cx NGTD x2d; both cx from knee aspirations NGTD x3d  22. Leukocytosis:repeat CBC ordered, procal ordered, consulted ID, continue Fortaz   Intra-op cultures reviewed and with NGTD final on 5/7  23. Anemia: Hgb reviewed and is stable, CT reviewed and there is no evidence of bleed, Eliquis  reduced to 2.5mg  BID as per GI recommendations, continue to monitor Hgb with HD  24. Dementia: added namenda  5mg  daily, continue  25. Neuropathic pain: added namenda  5mg  daily, metanx added, discussed with daughter, continue  15. Hypotension: will d/c tizanidine , resolved   >30 minutes spent in discharge of patient including review of medications and follow-up appointments, physical examination, and in answering all patient's questions   LOS: 24 days A FACE TO FACE EVALUATION WAS PERFORMED  [name] P [name] 08/01/2023, 10:33 AM

## 2023-08-01 NOTE — Progress Notes (Signed)
 [facility] transport here to pick up patient. Report given to Catlyn @ [facility]

## 2023-08-01 NOTE — Progress Notes (Signed)
 [name] NEPHROLOGY PROGRESS NOTE  Assessment/ Plan: Pt is a 78 y.o. yo female   # Functional debility/deficits status post acute hypoxic respiratory failure/uremia: Currently admitted to inpatient rehabilitation unit for intensive physical therapy and Occupational Therapy.  Plans noted now for discharge to skilled nursing facility (which in turn may likely affect dialysis unit assignment).  # New start ESRD: With recent frequent hospitalizations for volume overload.  Ongoing hemodialysis on MWF schedule via TDC (placed 06/21/2023 by Dr. Vikki Graves). Outpatient dialysis to begin at [facility]. Tolerated dialysis well yesterday with 1.3 L UF, next HD tomorrow.  # Vascular access: She required excision of her left upper arm AV graft with vein patch angioplasty of left brachial artery after developing steal symptoms.  Currently using tunneled HD catheter.  # Anemia: With recent EGD that did not show any evidence of bleeding.  On chronic Eliquis  for history of renal vein thrombosis-dose adjusted to 2.5 mg twice daily.  Hemoglobin and hematocrit appear stable.  # CKD-MBD: Calcium  and phosphorus levels currently at goal, continue to monitor on renal diet and sevelamer .  PTH at goal.  # Nutrition: Changed to renal diet with fluid restriction and ongoing protein supplementation.  # Hypertension: Blood pressures currently elevated, continue to monitor with hemodialysis/UF.  # Hyperkalemia: Changing to renal diet, ongoing management with dialysis.  Subjective: Seen and examined.  Denies nausea, vomiting, chest pain, shortness of breath.  No new event. Objective Vital signs in last 24 hours: Vitals:   08/01/23 0115 08/01/23 0145 08/01/23 0236 08/01/23 0503  BP: (!) 110/55 120/62 (!) 163/83 (!) 165/76  Pulse: 83 85 95   Resp: 17 10 16 14   Temp:  98.3 F (36.8 C) 98.3 F (36.8 C) 98.9 F (37.2 C)  TempSrc:  Oral  Oral  SpO2: 100% 100% 100% 100%  Weight:    74 kg   Height:       Weight change: 2.8 kg  Intake/Output Summary (Last 24 hours) at 08/01/2023 0957 Last data filed at 08/01/2023 0900 Gross per 24 hour  Intake 347 ml  Output 1300 ml  Net -953 ml       Labs: RENAL PANEL Recent Labs  Lab 07/26/23 1335 07/29/23 1407 07/31/23 2214  NA 136 139 134*  K 4.7 5.6* 5.4*  CL 96* 100 97*  CO2 24 22 24   GLUCOSE 153* 108* 123*  BUN 68* 87* 83*  CREATININE 6.90* 7.66* 7.43*  CALCIUM  9.6 9.7 9.5  PHOS 3.3 2.9 3.5  ALBUMIN  2.4* 2.5* 2.5*    Liver Function Tests: Recent Labs  Lab 07/26/23 1335 07/29/23 1407 07/31/23 2214  ALBUMIN  2.4* 2.5* 2.5*   No results for input(s): "LIPASE", "AMYLASE" in the last 168 hours. No results for input(s): "AMMONIA" in the last 168 hours. CBC: Recent Labs    01/31/23 1437 02/13/23 1332 02/28/23 1411 03/14/23 1450 04/04/23 1445 04/04/23 1446 04/18/23 1433 04/18/23 1434 05/08/23 1833 05/13/23 0358 05/13/23 0535 05/14/23 0802 05/28/23 0459 05/29/23 0735 06/17/23 0330 06/18/23 0334 07/24/23 0827 07/25/23 0655 07/26/23 1336 07/29/23 1406 07/31/23 2214  HGB 10.2*   < > 9.9*   < > 9.9*  --   --  9.4*   < >  --   --    < > 7.9*   < > 8.7*   < > 7.3* 7.3* 7.4* 7.5* 7.4*  MCV 87.7   < > 85.2   < > 85.9  --   --  86.4   < >  --   --    < >  87.1   < > 89.4   < > 95.7 94.2 95.8 95.5 95.1  VITAMINB12 1,098*  --  833  --  700  --   --  835  --  1,040*  --   --   --   --   --   --   --   --   --   --   --   FOLATE  --   --   --   --   --   --   --   --   --   --  5.3*  --   --   --   --   --   --   --   --   --   --   FERRITIN  --   --   --   --   --  153 226  --   --  220  --   --  246  --  192  --   --   --   --   --   --   TIBC  --   --   --   --   --   --   --   --   --  206*  --   --  207*  --  267  --   --   --   --   --   --   IRON   --   --   --   --   --   --   --   --   --  46  --   --  73  --  40  --   --   --   --   --   --   RETICCTPCT  --   --   --   --   --   --   --   --   --  1.5   --   --   --   --   --   --   --   --   --   --   --    < > = values in this interval not displayed.    Cardiac Enzymes: No results for input(s): "CKTOTAL", "CKMB", "CKMBINDEX", "TROPONINI" in the last 168 hours. CBG: No results for input(s): "GLUCAP" in the last 168 hours.  Iron  Studies: No results for input(s): "IRON ", "TIBC", "TRANSFERRIN", "FERRITIN" in the last 72 hours. Studies/Results: No results found.  Medications: Infusions:  cefTAZidime  (FORTAZ )  IV 0.5 g (07/30/23 2225)   vancomycin  750 mg (08/01/23 0322)    Scheduled Medications:  apixaban   2.5 mg Oral BID   B-complex with vitamin C  1 tablet Oral Daily   Chlorhexidine  Gluconate Cloth  6 each Topical Q12H   Chlorhexidine  Gluconate Cloth  6 each Topical Q0600   cholecalciferol   1,000 Units Oral Daily   colestipol   1 g Oral TID AC   darbepoetin (ARANESP ) injection - DIALYSIS  200 mcg Subcutaneous Q Sat-1800   diclofenac   1 patch Transdermal BID   FLUoxetine   20 mg Oral Daily   gabapentin   300 mg Oral QHS   heparin  sodium (porcine)  4,000 Units Intracatheter Once   kidney failure book   Does not apply Once   l-methylfolate-B6-B12  1 tablet Oral Daily   lipase/protease/amylase  36,000 Units Oral TID AC   memantine   5 mg Oral Daily   mirtazapine   7.5 mg Oral QHS  multivitamin  1 tablet Oral QHS   pantoprazole   40 mg Oral BID   rivastigmine   1.5 mg Oral BID   senna  2 tablet Oral Daily   sevelamer  carbonate  1,600 mg Oral TID WC   sodium chloride  flush  10-40 mL Intracatheter Q12H   torsemide   100 mg Oral Once per day on Sunday Tuesday Thursday Saturday    have reviewed scheduled and prn medications.  Physical Exam: General:NAD, comfortable Heart:RRR, s1s2 nl Lungs:clear b/l, no crackle Abdomen:soft, Non-tender, non-distended Extremities:No edema Dialysis Access: TDC, left AV graft excised.  [name] [name] [name] 08/01/2023,9:57 AM  LOS: 24 days

## 2023-08-01 NOTE — Progress Notes (Signed)
 Inpatient Rehabilitation Care Coordinator Discharge Note   Patient Details  Name: [name] MRN: [id number] Date of Birth: [birth year]   Discharge location: d/c to [facility]  Length of Stay: 23 days  Discharge activity level: w/c level at [facility]  Home/community participation: Limited  Patient response WG:NFAOZH Literacy - How often do you need to have someone help you when you read instructions, pamphlets, or other written material from your doctor or pharmacy?: Often  Patient response YQ:MVHQIO Isolation - How often do you feel lonely or isolated from those around you?: Patient unable to respond  Services provided included: MD, RD, PT, OT, RN, CM, TR, Pharmacy, Neuropsych, SW  Financial Services:       Choices offered to/list presented to: patient dtr [name]  Follow-up services arranged:              Patient response to transportation need: Is the patient able to respond to transportation needs?: Yes In the past 12 months, has lack of transportation kept you from medical appointments or from getting medications?: No In the past 12 months, has lack of transportation kept you from meetings, work, or from getting things needed for daily living?: No   Patient/Family verbalized understanding of follow-up arrangements:  Yes  Individual responsible for coordination of the follow-up plan: contact pt dtr [name]  Confirmed correct DME delivered: [name] 08/01/2023    Comments (or additional information):fam edu completed  Summary of Stay    Date/Time Discharge Planning CSW  07/31/23 1436 Pt will d/c to SNF-[facility]. Dialysis in place for transfer. AAC  07/24/23 1545 D/c to home with DIL. PRN support from other family. Fam edu on Thursday 1pm-4pm. SW will confirm there are no barriers to discharge. AAC  07/15/23 1150 D/c to home with DIL. PRN support from other family. SW will confirm there are no barriers to discharge. AAC  07/10/23 1500 D/c to home  with DIL. PRN support from other family. SW will confirm there are no barriers to discharge. AAC       [name] A [name]

## 2023-08-02 ENCOUNTER — Telehealth: Payer: Self-pay

## 2023-08-02 DIAGNOSIS — N186 End stage renal disease: Secondary | ICD-10-CM | POA: Diagnosis not present

## 2023-08-02 DIAGNOSIS — Z992 Dependence on renal dialysis: Secondary | ICD-10-CM | POA: Diagnosis not present

## 2023-08-02 DIAGNOSIS — D649 Anemia, unspecified: Secondary | ICD-10-CM | POA: Diagnosis not present

## 2023-08-02 DIAGNOSIS — T82898A Other specified complication of vascular prosthetic devices, implants and grafts, initial encounter: Secondary | ICD-10-CM | POA: Diagnosis not present

## 2023-08-02 DIAGNOSIS — M11261 Other chondrocalcinosis, right knee: Secondary | ICD-10-CM | POA: Diagnosis not present

## 2023-08-02 DIAGNOSIS — E1122 Type 2 diabetes mellitus with diabetic chronic kidney disease: Secondary | ICD-10-CM | POA: Diagnosis not present

## 2023-08-02 DIAGNOSIS — M009 Pyogenic arthritis, unspecified: Secondary | ICD-10-CM | POA: Diagnosis not present

## 2023-08-02 DIAGNOSIS — I509 Heart failure, unspecified: Secondary | ICD-10-CM | POA: Diagnosis not present

## 2023-08-02 DIAGNOSIS — N2581 Secondary hyperparathyroidism of renal origin: Secondary | ICD-10-CM | POA: Diagnosis not present

## 2023-08-02 DIAGNOSIS — I132 Hypertensive heart and chronic kidney disease with heart failure and with stage 5 chronic kidney disease, or end stage renal disease: Secondary | ICD-10-CM | POA: Diagnosis not present

## 2023-08-02 NOTE — Telephone Encounter (Signed)
 Transitional Care Call--who you spoke with    Are you/is patient experiencing any problems since coming home? Pt. Is at a [facility] rehabilitation center, no problems recorded. Are there any questions regarding any aspect of care? None reported. Are there any questions regarding medications administration/dosing? None reported.  Are meds being taken as prescribed? Yes. Patient should review meds with caller to confirm. Confirmed medication. Have there been any falls? None reported. Has Home Health been to the house and/or have they contacted you? No.  If not, have you tried to contact them? Can we help you contact them?   Are bowels and bladder emptying properly? Yes.  Are there any unexpected incontinence issues? None reported. If applicable, is patient following bowel/bladder programs? N/A Any fevers, problems with breathing, unexpected pain? None reported. Are there any skin problems or new areas of breakdown? None reported. Has the patient/family member arranged specialty MD follow up (ie cardiology/neurology/renal/surgical/etc)? Yes but the pat. Is in a rehabilitation center and all appointments will have to be rescheduled. Can we help arrange?  Does the patient need any other services or support that we can help arrange? None reported. Are caregivers following through as expected in assisting the patient? Yes.        11. Has the patient quit smoking, drinking alcohol, or using drugs as recommended? N/A   Appointment  [address] 08/14/2023 w/ Emilia Harbour @2 :20pm

## 2023-08-05 DIAGNOSIS — Z992 Dependence on renal dialysis: Secondary | ICD-10-CM | POA: Diagnosis not present

## 2023-08-05 DIAGNOSIS — N186 End stage renal disease: Secondary | ICD-10-CM | POA: Diagnosis not present

## 2023-08-05 DIAGNOSIS — N2581 Secondary hyperparathyroidism of renal origin: Secondary | ICD-10-CM | POA: Diagnosis not present

## 2023-08-07 DIAGNOSIS — M6281 Muscle weakness (generalized): Secondary | ICD-10-CM | POA: Diagnosis not present

## 2023-08-07 DIAGNOSIS — N2581 Secondary hyperparathyroidism of renal origin: Secondary | ICD-10-CM | POA: Diagnosis not present

## 2023-08-07 DIAGNOSIS — Z992 Dependence on renal dialysis: Secondary | ICD-10-CM | POA: Diagnosis not present

## 2023-08-07 DIAGNOSIS — G8929 Other chronic pain: Secondary | ICD-10-CM | POA: Diagnosis not present

## 2023-08-07 DIAGNOSIS — R2689 Other abnormalities of gait and mobility: Secondary | ICD-10-CM | POA: Diagnosis not present

## 2023-08-07 DIAGNOSIS — N186 End stage renal disease: Secondary | ICD-10-CM | POA: Diagnosis not present

## 2023-08-07 DIAGNOSIS — M009 Pyogenic arthritis, unspecified: Secondary | ICD-10-CM | POA: Diagnosis not present

## 2023-08-09 DIAGNOSIS — Z992 Dependence on renal dialysis: Secondary | ICD-10-CM | POA: Diagnosis not present

## 2023-08-09 DIAGNOSIS — N2581 Secondary hyperparathyroidism of renal origin: Secondary | ICD-10-CM | POA: Diagnosis not present

## 2023-08-09 DIAGNOSIS — N186 End stage renal disease: Secondary | ICD-10-CM | POA: Diagnosis not present

## 2023-08-12 DIAGNOSIS — Z992 Dependence on renal dialysis: Secondary | ICD-10-CM | POA: Diagnosis not present

## 2023-08-12 DIAGNOSIS — N2581 Secondary hyperparathyroidism of renal origin: Secondary | ICD-10-CM | POA: Diagnosis not present

## 2023-08-12 DIAGNOSIS — N186 End stage renal disease: Secondary | ICD-10-CM | POA: Diagnosis not present

## 2023-08-13 DIAGNOSIS — N186 End stage renal disease: Secondary | ICD-10-CM | POA: Diagnosis not present

## 2023-08-13 DIAGNOSIS — R2689 Other abnormalities of gait and mobility: Secondary | ICD-10-CM | POA: Diagnosis not present

## 2023-08-13 DIAGNOSIS — M6281 Muscle weakness (generalized): Secondary | ICD-10-CM | POA: Diagnosis not present

## 2023-08-13 DIAGNOSIS — G8929 Other chronic pain: Secondary | ICD-10-CM | POA: Diagnosis not present

## 2023-08-13 DIAGNOSIS — M009 Pyogenic arthritis, unspecified: Secondary | ICD-10-CM | POA: Diagnosis not present

## 2023-08-14 ENCOUNTER — Encounter: Attending: Registered Nurse | Admitting: Registered Nurse

## 2023-08-14 DIAGNOSIS — N186 End stage renal disease: Secondary | ICD-10-CM | POA: Diagnosis not present

## 2023-08-14 DIAGNOSIS — F324 Major depressive disorder, single episode, in partial remission: Secondary | ICD-10-CM | POA: Diagnosis not present

## 2023-08-14 DIAGNOSIS — K529 Noninfective gastroenteritis and colitis, unspecified: Secondary | ICD-10-CM | POA: Diagnosis not present

## 2023-08-14 DIAGNOSIS — L03114 Cellulitis of left upper limb: Secondary | ICD-10-CM | POA: Diagnosis not present

## 2023-08-14 DIAGNOSIS — I509 Heart failure, unspecified: Secondary | ICD-10-CM | POA: Diagnosis not present

## 2023-08-14 DIAGNOSIS — G894 Chronic pain syndrome: Secondary | ICD-10-CM | POA: Diagnosis not present

## 2023-08-14 DIAGNOSIS — I132 Hypertensive heart and chronic kidney disease with heart failure and with stage 5 chronic kidney disease, or end stage renal disease: Secondary | ICD-10-CM | POA: Diagnosis not present

## 2023-08-14 DIAGNOSIS — Z992 Dependence on renal dialysis: Secondary | ICD-10-CM | POA: Diagnosis not present

## 2023-08-14 DIAGNOSIS — E1122 Type 2 diabetes mellitus with diabetic chronic kidney disease: Secondary | ICD-10-CM | POA: Diagnosis not present

## 2023-08-14 DIAGNOSIS — D649 Anemia, unspecified: Secondary | ICD-10-CM | POA: Diagnosis not present

## 2023-08-14 DIAGNOSIS — N2581 Secondary hyperparathyroidism of renal origin: Secondary | ICD-10-CM | POA: Diagnosis not present

## 2023-08-15 ENCOUNTER — Ambulatory Visit (INDEPENDENT_AMBULATORY_CARE_PROVIDER_SITE_OTHER): Payer: Self-pay | Admitting: Internal Medicine

## 2023-08-15 ENCOUNTER — Encounter: Payer: Self-pay | Admitting: Internal Medicine

## 2023-08-15 ENCOUNTER — Telehealth: Payer: Self-pay

## 2023-08-15 ENCOUNTER — Other Ambulatory Visit: Payer: Self-pay

## 2023-08-15 VITALS — BP 132/65 | HR 72 | Resp 16 | Ht 64.0 in | Wt 163.0 lb

## 2023-08-15 DIAGNOSIS — M0080 Arthritis due to other bacteria, unspecified joint: Secondary | ICD-10-CM

## 2023-08-15 DIAGNOSIS — M1120 Other chondrocalcinosis, unspecified site: Secondary | ICD-10-CM | POA: Diagnosis not present

## 2023-08-15 NOTE — Progress Notes (Signed)
 Patient ID: [name], female   DOB: 1945/04/05, 78 y.o.   MRN: [id number]  HPI 78 year old female with hx of ESRD on HD, who was admitted with bilateral knee pain and found to have pseudogout but right knee concerning for septic arthritis since synovial fluid showed 89K with 92% N, but negative cultures. She underwent I x d of right knee, murky fluid irrigated out. She was treated with 4 wk of vancomycin  and ceftazidime  with HD through 5/29. She reports that She tolerated the iv abtx with Hd. No issues with diarrhea, rash, or thrush. Her knees are feeling better. No recurrent effusion or pain. Still stiff bilaterally that she attributes to arthritis of old age.  Outpatient Encounter Medications as of 08/15/2023  Medication Sig   acetaminophen  (TYLENOL ) 325 MG tablet Take 2 tablets (650 mg total) by mouth every 6 (six) hours as needed for mild pain (pain score 1-3) or headache.   apixaban  (ELIQUIS ) 2.5 MG TABS tablet Take 1 tablet (2.5 mg total) by mouth 2 (two) times daily.   B Complex-C (B-COMPLEX WITH VITAMIN C) tablet Take 1 tablet by mouth daily.   calcium  carbonate (TUMS - DOSED IN MG ELEMENTAL CALCIUM ) 500 MG chewable tablet Chew 1 tablet (200 mg of elemental calcium  total) by mouth 3 (three) times daily as needed for indigestion or heartburn.   cefTAZidime  0.5 g in dextrose  5 % 50 mL Inject 0.5 g into the vein daily. Thorough May 30th   cholecalciferol  (CHOLECALCIFEROL ) 25 MCG tablet Take 1 tablet (1,000 Units total) by mouth daily.   colestipol  (COLESTID ) 1 g tablet Take 1 tablet (1 g total) by mouth 3 (three) times daily.   diclofenac  (FLECTOR ) 1.3 % PTCH Place 1 patch onto the skin 2 (two) times daily.   diclofenac  Sodium (VOLTAREN ) 1 % GEL Apply 2 g topically 3 (three) times daily as needed (right knee).   FLUoxetine  (PROZAC ) 20 MG capsule Take 20 mg by mouth daily.   gabapentin  (NEURONTIN ) 300 MG capsule Take 300 mg by mouth at bedtime.   l-methylfolate-B6-B12 (METANX)  3-35-2 MG TABS tablet Take 1 tablet by mouth daily.   lipase/protease/amylase (CREON ) 36000 UNITS CPEP capsule Take 1 capsule (36,000 Units total) by mouth 3 (three) times daily before meals.   melatonin 3 MG TABS tablet Take 1 tablet (3 mg total) by mouth at bedtime as needed.   memantine  (NAMENDA ) 5 MG tablet Take 1 tablet (5 mg total) by mouth daily.   mirtazapine  (REMERON ) 7.5 MG tablet Take 7.5 mg by mouth at bedtime.   Mouthwashes (MOUTH RINSE) LIQD solution 15 mLs by Mouth Rinse route as needed (for oral care).   multivitamin (RENA-VIT) TABS tablet Take 1 tablet by mouth at bedtime.   oxyCODONE  (OXY IR/ROXICODONE ) 5 MG immediate release tablet Take 1 tablet (5 mg total) by mouth every 6 (six) hours as needed for severe pain (pain score 7-10).   pantoprazole  (PROTONIX ) 40 MG tablet Take 1 tablet (40 mg total) by mouth 2 (two) times daily.   prochlorperazine  (COMPAZINE ) 5 MG tablet Take 1-2 tablets (5-10 mg total) by mouth every 6 (six) hours as needed for nausea.   senna (SENOKOT) 8.6 MG TABS tablet Take 2 tablets (17.2 mg total) by mouth daily.   sevelamer  carbonate (RENVELA ) 800 MG tablet Take 2 tablets (1,600 mg total) by mouth 3 (three) times daily with meals.   torsemide  (DEMADEX ) 100 MG tablet Daily on Sun, Tue, Thurs, Sat.   vancomycin  (VANCOREADY) 750 MG/150ML SOLN  Inject 150 mLs (750 mg total) into the vein every Monday, Wednesday, and Friday with hemodialysis. Continue through May 30th.   rivastigmine  (EXELON ) 1.5 MG capsule Take 1 capsule (1.5 mg total) by mouth 2 (two) times daily.   No facility-administered encounter medications on file as of 08/15/2023.     Patient Active Problem List   Diagnosis Date Noted   Knee effusion 07/25/2023   Hand steal syndrome, sequela 07/25/2023   ESRD on dialysis (HCC) 07/25/2023   Septic arthritis (HCC) 07/18/2023   Pseudogout 07/18/2023   Gout flare 06/22/2023   Chronic kidney disease (CKD), stage V (HCC) 06/15/2023   Volume overload  06/15/2023   Mitral regurgitation 09/22/2022   Tricuspid regurgitation 09/22/2022   Acute metabolic encephalopathy 07/28/2022   Acute on chronic anemia 02/18/2022   Pyuria, sterile 02/03/2022   Hypomagnesemia 12/26/2021   Acute lower UTI 11/05/2021   History of DVT (deep vein thrombosis) 11/05/2021   Depression 10/29/2021   Acute on chronic diastolic CHF (congestive heart failure) (HCC) 10/24/2021   Chronic gastritis 10/19/2021   Weakness 12/02/2020   Chronic heart failure with preserved ejection fraction (HFpEF) (HCC) 12/02/2020   Elevated troponin 11/15/2020   Acute kidney injury superimposed on chronic kidney disease (HCC) 11/15/2020   Unintentional weight loss 11/15/2020   Malnutrition of moderate degree 09/27/2020   Transaminitis 09/23/2020   Acute renal failure superimposed on stage 4 chronic kidney disease (HCC) 09/03/2020   Chronic diastolic CHF (congestive heart failure) (HCC) 09/03/2020   Nausea & vomiting 09/03/2020   Dyspnea 06/28/2020   Chronic diarrhea - responds to budesonide  09/10/2019   Iron  deficiency anemia 08/19/2018   Spondylosis of lumbar spine 12/24/2017   Renal vein thrombosis (HCC) 09/17/2017   Hoarse voice quality 09/09/2017   Low back pain without sciatica 09/09/2017   Hypokalemia due to excessive gastrointestinal loss of potassium    Leukocytosis    Essential hypertension    Urinary frequency    Type 2 diabetes mellitus (HCC)    Benign essential HTN    Dysphagia    Debility    Encephalopathy    Pressure injury of skin 08/04/2017   Diffuse pulmonary alveolar hemorrhage    SOB (shortness of breath) 10/11/2016   Hyperparathyroidism, secondary renal (HCC) 05/02/2016   Gout 04/11/2015   Diabetic neuropathy associated with type 2 diabetes mellitus (HCC) 01/06/2015   SBO (small bowel obstruction) (HCC) 09/15/2013   Breast cancer of lower-inner quadrant of right female breast (HCC) 03/18/2013   Chronic venous insufficiency of lower extremity  01/06/2013   Health care maintenance 05/08/2011   Seasonal allergies 05/03/2010   Labile hypertension    CAD (coronary artery disease)    Abdominal discomfort    Anemia of chronic kidney failure, stage 4 (severe) (HCC)    History of stroke without residual deficits    Chronic pain    Adnexal mass    GERD (gastroesophageal reflux disease) 01/19/2009   Spinal stenosis, lumbar region, with neurogenic claudication 01/03/2009     Health Maintenance Due  Topic Date Due   COVID-19 Vaccine (1) Never done   Zoster Vaccines- Shingrix (1 of 2) Never done   OPHTHALMOLOGY EXAM  10/12/2016   FOOT EXAM  12/20/2017   DTaP/Tdap/Td (2 - Tdap) 04/29/2019   LIPID PANEL  11/16/2021   HEMOGLOBIN A1C  08/05/2023     Review of Systems 12 point ros is negative Physical Exam   BP 132/65   Pulse 72   Resp 16   Ht 5\' 4"  (  1.626 m)   Wt 163 lb (73.9 kg)   SpO2 99%   BMI 27.98 kg/m    Physical Exam  Constitutional:  oriented to person, place, and time. appears well-developed and well-nourished. No distress.  HENT: [place]/AT, PERRLA, no scleral icterus Mouth/Throat: Oropharynx is clear and moist. No oropharyngeal exudate.  Cardiovascular: Normal rate, regular rhythm and normal heart sounds. Exam reveals no gallop and no friction rub.  No murmur heard.  Pulmonary/Chest: Effort normal and breath sounds normal. No respiratory distress.  has no wheezes.  Neck = supple, no nuchal rigidity Abdominal: Soft. Bowel sounds are normal.  exhibits no distension. There is no tenderness.  Lymphadenopathy: no cervical adenopathy. No axillary adenopathy Neurological: alert and oriented to person, place, and time.  Skin: Skin is warm and dry. No rash noted. No erythema.  Psychiatric: a normal mood and affect.  behavior is normal.    CBC Lab Results  Component Value Date   WBC 8.9 07/31/2023   RBC 2.63 (L) 07/31/2023   HGB 7.4 (L) 07/31/2023   HCT 25.0 (L) 07/31/2023   PLT 395 07/31/2023   MCV 95.1 07/31/2023    MCH 28.1 07/31/2023   MCHC 29.6 (L) 07/31/2023   RDW 17.6 (H) 07/31/2023   LYMPHSABS 1.3 07/31/2023   MONOABS 0.6 07/31/2023   EOSABS 0.1 07/31/2023    BMET Lab Results  Component Value Date   NA 134 (L) 07/31/2023   K 5.4 (H) 07/31/2023   CL 97 (L) 07/31/2023   CO2 24 07/31/2023   GLUCOSE 123 (H) 07/31/2023   BUN 83 (H) 07/31/2023   CREATININE 7.43 (H) 07/31/2023   CALCIUM  9.5 07/31/2023   GFRNONAA 5 (L) 07/31/2023   GFRAA 21 (L) 12/21/2019      Assessment and Plan  Right knee probable septic arthritis, culture negative = [name] has  Completed course of septic arthritis of knees. No need for further abtx with HD  Pseudogout of knees, bilaterally = appears to have healed from recent flare  ESRD on HD = stable  Rtc as needed

## 2023-08-15 NOTE — Telephone Encounter (Signed)
 Spoke to daughter who is listed as contact in chart. She came after patient was seen. Discussed IV Abx she gets with dialysis at her SNF facility -[facility] -will end on 08/15/23. Also LM with [facility] and sent order in folder with facility driver stating EOT 1/61/09.  Sent to [facility] for OPAT tracking.

## 2023-08-16 DIAGNOSIS — Z992 Dependence on renal dialysis: Secondary | ICD-10-CM | POA: Diagnosis not present

## 2023-08-16 DIAGNOSIS — N2581 Secondary hyperparathyroidism of renal origin: Secondary | ICD-10-CM | POA: Diagnosis not present

## 2023-08-16 DIAGNOSIS — N186 End stage renal disease: Secondary | ICD-10-CM | POA: Diagnosis not present

## 2023-08-17 DIAGNOSIS — I129 Hypertensive chronic kidney disease with stage 1 through stage 4 chronic kidney disease, or unspecified chronic kidney disease: Secondary | ICD-10-CM | POA: Diagnosis not present

## 2023-08-17 DIAGNOSIS — N186 End stage renal disease: Secondary | ICD-10-CM | POA: Diagnosis not present

## 2023-08-17 DIAGNOSIS — Z992 Dependence on renal dialysis: Secondary | ICD-10-CM | POA: Diagnosis not present

## 2023-08-19 DIAGNOSIS — R2689 Other abnormalities of gait and mobility: Secondary | ICD-10-CM | POA: Diagnosis not present

## 2023-08-19 DIAGNOSIS — G8929 Other chronic pain: Secondary | ICD-10-CM | POA: Diagnosis not present

## 2023-08-19 DIAGNOSIS — M009 Pyogenic arthritis, unspecified: Secondary | ICD-10-CM | POA: Diagnosis not present

## 2023-08-19 DIAGNOSIS — N2581 Secondary hyperparathyroidism of renal origin: Secondary | ICD-10-CM | POA: Diagnosis not present

## 2023-08-19 DIAGNOSIS — N186 End stage renal disease: Secondary | ICD-10-CM | POA: Diagnosis not present

## 2023-08-19 DIAGNOSIS — Z992 Dependence on renal dialysis: Secondary | ICD-10-CM | POA: Diagnosis not present

## 2023-08-19 DIAGNOSIS — M6281 Muscle weakness (generalized): Secondary | ICD-10-CM | POA: Diagnosis not present

## 2023-08-20 ENCOUNTER — Ambulatory Visit: Attending: Vascular Surgery | Admitting: Physician Assistant

## 2023-08-20 VITALS — BP 147/66 | HR 80 | Temp 97.9°F | Resp 18 | Ht 64.0 in

## 2023-08-20 DIAGNOSIS — N186 End stage renal disease: Secondary | ICD-10-CM

## 2023-08-20 DIAGNOSIS — Z992 Dependence on renal dialysis: Secondary | ICD-10-CM

## 2023-08-21 DIAGNOSIS — N2581 Secondary hyperparathyroidism of renal origin: Secondary | ICD-10-CM | POA: Diagnosis not present

## 2023-08-21 DIAGNOSIS — Z992 Dependence on renal dialysis: Secondary | ICD-10-CM | POA: Diagnosis not present

## 2023-08-21 DIAGNOSIS — N186 End stage renal disease: Secondary | ICD-10-CM | POA: Diagnosis not present

## 2023-08-23 DIAGNOSIS — Z992 Dependence on renal dialysis: Secondary | ICD-10-CM | POA: Diagnosis not present

## 2023-08-23 DIAGNOSIS — N186 End stage renal disease: Secondary | ICD-10-CM | POA: Diagnosis not present

## 2023-08-23 DIAGNOSIS — R2689 Other abnormalities of gait and mobility: Secondary | ICD-10-CM | POA: Diagnosis not present

## 2023-08-23 DIAGNOSIS — N2581 Secondary hyperparathyroidism of renal origin: Secondary | ICD-10-CM | POA: Diagnosis not present

## 2023-08-23 DIAGNOSIS — M009 Pyogenic arthritis, unspecified: Secondary | ICD-10-CM | POA: Diagnosis not present

## 2023-08-23 DIAGNOSIS — R41841 Cognitive communication deficit: Secondary | ICD-10-CM | POA: Diagnosis not present

## 2023-08-23 DIAGNOSIS — M6281 Muscle weakness (generalized): Secondary | ICD-10-CM | POA: Diagnosis not present

## 2023-08-24 DIAGNOSIS — R41841 Cognitive communication deficit: Secondary | ICD-10-CM | POA: Diagnosis not present

## 2023-08-24 DIAGNOSIS — M6281 Muscle weakness (generalized): Secondary | ICD-10-CM | POA: Diagnosis not present

## 2023-08-24 DIAGNOSIS — R2689 Other abnormalities of gait and mobility: Secondary | ICD-10-CM | POA: Diagnosis not present

## 2023-08-24 DIAGNOSIS — M009 Pyogenic arthritis, unspecified: Secondary | ICD-10-CM | POA: Diagnosis not present

## 2023-08-26 DIAGNOSIS — N2581 Secondary hyperparathyroidism of renal origin: Secondary | ICD-10-CM | POA: Diagnosis not present

## 2023-08-26 DIAGNOSIS — F324 Major depressive disorder, single episode, in partial remission: Secondary | ICD-10-CM | POA: Diagnosis not present

## 2023-08-26 DIAGNOSIS — L03114 Cellulitis of left upper limb: Secondary | ICD-10-CM | POA: Diagnosis not present

## 2023-08-26 DIAGNOSIS — R2689 Other abnormalities of gait and mobility: Secondary | ICD-10-CM | POA: Diagnosis not present

## 2023-08-26 DIAGNOSIS — R41841 Cognitive communication deficit: Secondary | ICD-10-CM | POA: Diagnosis not present

## 2023-08-26 DIAGNOSIS — I132 Hypertensive heart and chronic kidney disease with heart failure and with stage 5 chronic kidney disease, or end stage renal disease: Secondary | ICD-10-CM | POA: Diagnosis not present

## 2023-08-26 DIAGNOSIS — M009 Pyogenic arthritis, unspecified: Secondary | ICD-10-CM | POA: Diagnosis not present

## 2023-08-26 DIAGNOSIS — Z992 Dependence on renal dialysis: Secondary | ICD-10-CM | POA: Diagnosis not present

## 2023-08-26 DIAGNOSIS — D649 Anemia, unspecified: Secondary | ICD-10-CM | POA: Diagnosis not present

## 2023-08-26 DIAGNOSIS — I509 Heart failure, unspecified: Secondary | ICD-10-CM | POA: Diagnosis not present

## 2023-08-26 DIAGNOSIS — N186 End stage renal disease: Secondary | ICD-10-CM | POA: Diagnosis not present

## 2023-08-26 DIAGNOSIS — G894 Chronic pain syndrome: Secondary | ICD-10-CM | POA: Diagnosis not present

## 2023-08-26 DIAGNOSIS — E1122 Type 2 diabetes mellitus with diabetic chronic kidney disease: Secondary | ICD-10-CM | POA: Diagnosis not present

## 2023-08-26 DIAGNOSIS — K529 Noninfective gastroenteritis and colitis, unspecified: Secondary | ICD-10-CM | POA: Diagnosis not present

## 2023-08-26 DIAGNOSIS — M6281 Muscle weakness (generalized): Secondary | ICD-10-CM | POA: Diagnosis not present

## 2023-08-28 ENCOUNTER — Emergency Department (HOSPITAL_COMMUNITY)
Admission: EM | Admit: 2023-08-28 | Discharge: 2023-08-28 | Disposition: A | Discharge status: Home / Self Care | Attending: Emergency Medicine | Admitting: Emergency Medicine

## 2023-08-28 ENCOUNTER — Encounter (HOSPITAL_COMMUNITY): Payer: Self-pay | Admitting: Emergency Medicine

## 2023-08-28 ENCOUNTER — Emergency Department (HOSPITAL_COMMUNITY)

## 2023-08-28 ENCOUNTER — Other Ambulatory Visit: Payer: Self-pay

## 2023-08-28 DIAGNOSIS — Z743 Need for continuous supervision: Secondary | ICD-10-CM | POA: Diagnosis not present

## 2023-08-28 DIAGNOSIS — N186 End stage renal disease: Secondary | ICD-10-CM | POA: Insufficient documentation

## 2023-08-28 DIAGNOSIS — I509 Heart failure, unspecified: Secondary | ICD-10-CM | POA: Diagnosis not present

## 2023-08-28 DIAGNOSIS — I2510 Atherosclerotic heart disease of native coronary artery without angina pectoris: Secondary | ICD-10-CM | POA: Insufficient documentation

## 2023-08-28 DIAGNOSIS — Z992 Dependence on renal dialysis: Secondary | ICD-10-CM | POA: Diagnosis not present

## 2023-08-28 DIAGNOSIS — I213 ST elevation (STEMI) myocardial infarction of unspecified site: Secondary | ICD-10-CM | POA: Diagnosis not present

## 2023-08-28 DIAGNOSIS — I132 Hypertensive heart and chronic kidney disease with heart failure and with stage 5 chronic kidney disease, or end stage renal disease: Secondary | ICD-10-CM | POA: Diagnosis not present

## 2023-08-28 DIAGNOSIS — Z7901 Long term (current) use of anticoagulants: Secondary | ICD-10-CM | POA: Insufficient documentation

## 2023-08-28 DIAGNOSIS — E1122 Type 2 diabetes mellitus with diabetic chronic kidney disease: Secondary | ICD-10-CM | POA: Insufficient documentation

## 2023-08-28 DIAGNOSIS — I10 Essential (primary) hypertension: Secondary | ICD-10-CM | POA: Diagnosis not present

## 2023-08-28 DIAGNOSIS — Z8673 Personal history of transient ischemic attack (TIA), and cerebral infarction without residual deficits: Secondary | ICD-10-CM | POA: Insufficient documentation

## 2023-08-28 DIAGNOSIS — R079 Chest pain, unspecified: Secondary | ICD-10-CM

## 2023-08-28 DIAGNOSIS — R0789 Other chest pain: Secondary | ICD-10-CM | POA: Insufficient documentation

## 2023-08-28 DIAGNOSIS — Z79899 Other long term (current) drug therapy: Secondary | ICD-10-CM | POA: Diagnosis not present

## 2023-08-28 DIAGNOSIS — N2581 Secondary hyperparathyroidism of renal origin: Secondary | ICD-10-CM | POA: Diagnosis not present

## 2023-08-28 DIAGNOSIS — R0602 Shortness of breath: Secondary | ICD-10-CM | POA: Insufficient documentation

## 2023-08-28 DIAGNOSIS — I4510 Unspecified right bundle-branch block: Secondary | ICD-10-CM | POA: Diagnosis not present

## 2023-08-28 LAB — CBC
HCT: 35 % — ABNORMAL LOW (ref 36.0–46.0)
Hemoglobin: 10.4 g/dL — ABNORMAL LOW (ref 12.0–15.0)
MCH: 27.4 pg (ref 26.0–34.0)
MCHC: 29.7 g/dL — ABNORMAL LOW (ref 30.0–36.0)
MCV: 92.1 fL (ref 80.0–100.0)
Platelets: 156 10*3/uL (ref 150–400)
RBC: 3.8 MIL/uL — ABNORMAL LOW (ref 3.87–5.11)
RDW: 17.3 % — ABNORMAL HIGH (ref 11.5–15.5)
WBC: 7 10*3/uL (ref 4.0–10.5)
nRBC: 0 % (ref 0.0–0.2)

## 2023-08-28 LAB — COMPREHENSIVE METABOLIC PANEL WITH GFR
ALT: 11 U/L (ref 0–44)
AST: 29 U/L (ref 15–41)
Albumin: 3.2 g/dL — ABNORMAL LOW (ref 3.5–5.0)
Alkaline Phosphatase: 144 U/L — ABNORMAL HIGH (ref 38–126)
Anion gap: 16 — ABNORMAL HIGH (ref 5–15)
BUN: 15 mg/dL (ref 8–23)
CO2: 25 mmol/L (ref 22–32)
Calcium: 8.6 mg/dL — ABNORMAL LOW (ref 8.9–10.3)
Chloride: 95 mmol/L — ABNORMAL LOW (ref 98–111)
Creatinine, Ser: 2.61 mg/dL — ABNORMAL HIGH (ref 0.44–1.00)
GFR, Estimated: 18 mL/min — ABNORMAL LOW (ref >60)
Glucose, Bld: 79 mg/dL (ref 70–99)
Potassium: 4 mmol/L (ref 3.5–5.1)
Sodium: 136 mmol/L (ref 135–145)
Total Bilirubin: 0.4 mg/dL (ref 0.0–1.2)
Total Protein: 8 g/dL (ref 6.5–8.1)

## 2023-08-28 LAB — I-STAT CG4 LACTIC ACID, ED: Lactic Acid, Venous: 1.4 mmol/L (ref 0.5–1.9)

## 2023-08-28 LAB — TROPONIN I (HIGH SENSITIVITY)
Troponin I (High Sensitivity): 16 ng/L (ref <18)
Troponin I (High Sensitivity): 18 ng/L — ABNORMAL HIGH (ref <18)

## 2023-08-28 LAB — LIPASE, BLOOD: Lipase: 60 U/L — ABNORMAL HIGH (ref 11–51)

## 2023-08-28 LAB — BRAIN NATRIURETIC PEPTIDE: B Natriuretic Peptide: 150.9 pg/mL — ABNORMAL HIGH (ref 0.0–100.0)

## 2023-08-28 NOTE — Discharge Instructions (Signed)
 As we discussed, your labs, EKG and Chest xray are all very reassuring. You are felt stable for discharge. Please follow up with your primary care physician as needed. It is also recommended, with your history of heart stents, that you are evaluated in the outpatient setting by cardiology.   Return to the ED as needed.

## 2023-08-28 NOTE — ED Provider Notes (Signed)
  [facility] Provider Note   CSN: [id number] Arrival date & time: 08/28/23  1642     History  Chief Complaint  Patient presents with   Chest Pain    [name] is a 78 y.o. female.  Patient to ED for evaluation of chest pain that started earlier this afternoon while on dialysis. She was able to complete 3.5 hours of her 4 hour session. She reports associated SOB. Pain and SOB are completely resolved now, after episode that lasted about 2 hours. No nausea, vomiting. No history of chest pain in the past.  The history is provided by the patient. No language interpreter was used.  Chest Pain      Home Medications Prior to Admission medications   Medication Sig Start Date End Date Taking? Authorizing Provider  acetaminophen  (TYLENOL ) 325 MG tablet Take 2 tablets (650 mg total) by mouth every 6 (six) hours as needed for mild pain (pain score 1-3) or headache. 07/11/23   Love, Renay Carota, PA-C  apixaban  (ELIQUIS ) 2.5 MG TABS tablet Take 1 tablet (2.5 mg total) by mouth 2 (two) times daily. 07/26/23   Love, Renay Carota, PA-C  B Complex-C (B-COMPLEX WITH VITAMIN C) tablet Take 1 tablet by mouth daily. 07/12/23   Love, Renay Carota, PA-C  calcium  carbonate (TUMS - DOSED IN MG ELEMENTAL CALCIUM ) 500 MG chewable tablet Chew 1 tablet (200 mg of elemental calcium  total) by mouth 3 (three) times daily as needed for indigestion or heartburn. 07/26/23   Love, Renay Carota, PA-C  cefTAZidime  0.5 g in dextrose  5 % 50 mL Inject 0.5 g into the vein daily. Thorough May 30th 08/01/23   Love, Renay Carota, PA-C  cholecalciferol  (CHOLECALCIFEROL ) 25 MCG tablet Take 1 tablet (1,000 Units total) by mouth daily. 07/26/23   Love, Renay Carota, PA-C  colestipol  (COLESTID ) 1 g tablet Take 1 tablet (1 g total) by mouth 3 (three) times daily. 05/14/23   Jodeane Mulligan, DO  diclofenac  (FLECTOR ) 1.3 % PTCH Place 1 patch onto the skin 2 (two) times daily. 07/26/23   Love, Renay Carota, PA-C   diclofenac  Sodium (VOLTAREN ) 1 % GEL Apply 2 g topically 3 (three) times daily as needed (right knee). 07/26/23   Love, Renay Carota, PA-C  FLUoxetine  (PROZAC ) 20 MG capsule Take 20 mg by mouth daily. 05/05/23   [provider]  gabapentin  (NEURONTIN ) 300 MG capsule Take 300 mg by mouth at bedtime. 05/02/23   [provider]  l-methylfolate-B6-B12 (METANX) 3-35-2 MG TABS tablet Take 1 tablet by mouth daily. 07/26/23   Love, Renay Carota, PA-C  lipase/protease/amylase (CREON ) 36000 UNITS CPEP capsule Take 1 capsule (36,000 Units total) by mouth 3 (three) times daily before meals. 07/26/23   Love, Renay Carota, PA-C  melatonin 3 MG TABS tablet Take 1 tablet (3 mg total) by mouth at bedtime as needed. 07/26/23   Love, Renay Carota, PA-C  memantine  (NAMENDA ) 5 MG tablet Take 1 tablet (5 mg total) by mouth daily. 07/26/23   Love, Renay Carota, PA-C  mirtazapine  (REMERON ) 7.5 MG tablet Take 7.5 mg by mouth at bedtime. 04/22/23   [provider]  Mouthwashes (MOUTH RINSE) LIQD solution 15 mLs by Mouth Rinse route as needed (for oral care). 07/31/23   Love, Renay Carota, PA-C  multivitamin (RENA-VIT) TABS tablet Take 1 tablet by mouth at bedtime. 07/26/23   Love, Renay Carota, PA-C  oxyCODONE  (OXY IR/ROXICODONE ) 5 MG immediate release tablet Take 1 tablet (5 mg  total) by mouth every 6 (six) hours as needed for severe pain (pain score 7-10). 07/31/23   Love, Renay Carota, PA-C  pantoprazole  (PROTONIX ) 40 MG tablet Take 1 tablet (40 mg total) by mouth 2 (two) times daily. 07/26/23   Love, Renay Carota, PA-C  prochlorperazine  (COMPAZINE ) 5 MG tablet Take 1-2 tablets (5-10 mg total) by mouth every 6 (six) hours as needed for nausea. 07/31/23   Love, Renay Carota, PA-C  rivastigmine  (EXELON ) 1.5 MG capsule Take 1 capsule (1.5 mg total) by mouth 2 (two) times daily. 12/27/22 07/25/23  Camara, Amadou, MD  senna (SENOKOT) 8.6 MG TABS tablet Take 2 tablets (17.2 mg total) by mouth daily. 07/27/23   Love, Renay Carota, PA-C  sevelamer  carbonate (RENVELA )  800 MG tablet Take 2 tablets (1,600 mg total) by mouth 3 (three) times daily with meals. 07/26/23   Love, Renay Carota, PA-C  torsemide  (DEMADEX ) 100 MG tablet Daily on Sun, Tue, Thurs, Sat. 07/27/23   Zelda Hickman, PA-C  vancomycin  (VANCOREADY) 750 MG/150ML SOLN Inject 150 mLs (750 mg total) into the vein every Monday, Wednesday, and Friday with hemodialysis. Continue through May 30th. 07/26/23   Love, Renay Carota, PA-C      Allergies    Anesthetics, halogenated; Etomidate ; Fentanyl ; Percocet [oxycodone -acetaminophen ]; Diazepam; Lorazepam ; Tramadol  hcl; Haloperidol; Morphine  sulfate; Propoxyphene; and Versed  [midazolam ]    Review of Systems   Review of Systems  Cardiovascular:  Positive for chest pain.    Physical Exam Updated Vital Signs BP (!) 174/67   Pulse 79   Temp 98.5 F (36.9 C) (Oral)   Resp 14   SpO2 100%  Physical Exam Vitals and nursing note reviewed.  Constitutional:      Appearance: She is well-developed.  HENT:     Head: Normocephalic.  Cardiovascular:     Rate and Rhythm: Normal rate and regular rhythm.     Heart sounds: No murmur heard. Pulmonary:     Effort: Pulmonary effort is normal.     Breath sounds: Normal breath sounds. No wheezing, rhonchi or rales.     Comments: Dialysis catheter in right chest, site unremarkable. Tender to left upper chest.  Chest:     Chest wall: Tenderness present.  Abdominal:     General: Bowel sounds are normal.     Palpations: Abdomen is soft.     Tenderness: There is no abdominal tenderness. There is no guarding or rebound.  Musculoskeletal:        General: Normal range of motion.     Cervical back: Normal range of motion and neck supple.  Skin:    General: Skin is warm and dry.  Neurological:     General: No focal deficit present.     Mental Status: She is alert and oriented to person, place, and time.     ED Results / Procedures / Treatments   Labs (all labs ordered are listed, but only abnormal results are  displayed) Labs Reviewed  CBC - Abnormal; Notable for the following components:      Result Value   RBC 3.80 (*)    Hemoglobin 10.4 (*)    HCT 35.0 (*)    MCHC 29.7 (*)    RDW 17.3 (*)    All other components within normal limits  COMPREHENSIVE METABOLIC PANEL WITH GFR - Abnormal; Notable for the following components:   Chloride 95 (*)    Creatinine, Ser 2.61 (*)    Calcium  8.6 (*)    Albumin  3.2 (*)  Alkaline Phosphatase 144 (*)    GFR, Estimated 18 (*)    Anion gap 16 (*)    All other components within normal limits  BRAIN NATRIURETIC PEPTIDE - Abnormal; Notable for the following components:   B Natriuretic Peptide 150.9 (*)    All other components within normal limits  LIPASE, BLOOD - Abnormal; Notable for the following components:   Lipase 60 (*)    All other components within normal limits  TROPONIN I (HIGH SENSITIVITY) - Abnormal; Notable for the following components:   Troponin I (High Sensitivity) 18 (*)    All other components within normal limits  I-STAT CG4 LACTIC ACID, ED  TROPONIN I (HIGH SENSITIVITY)   Results for orders placed or performed during the hospital encounter of 08/28/23  I-Stat Lactic Acid   Collection Time: 08/28/23  6:09 PM  Result Value Ref Range   Lactic Acid, Venous 1.4 0.5 - 1.9 mmol/L  CBC   Collection Time: 08/28/23  6:16 PM  Result Value Ref Range   WBC 7.0 4.0 - 10.5 K/uL   RBC 3.80 (L) 3.87 - 5.11 MIL/uL   Hemoglobin 10.4 (L) 12.0 - 15.0 g/dL   HCT 14.7 (L) 82.9 - 56.2 %   MCV 92.1 80.0 - 100.0 fL   MCH 27.4 26.0 - 34.0 pg   MCHC 29.7 (L) 30.0 - 36.0 g/dL   RDW 13.0 (H) 86.5 - 78.4 %   Platelets 156 150 - 400 K/uL   nRBC 0.0 0.0 - 0.2 %  Comprehensive metabolic panel   Collection Time: 08/28/23  6:16 PM  Result Value Ref Range   Sodium 136 135 - 145 mmol/L   Potassium 4.0 3.5 - 5.1 mmol/L   Chloride 95 (L) 98 - 111 mmol/L   CO2 25 22 - 32 mmol/L   Glucose, Bld 79 70 - 99 mg/dL   BUN 15 8 - 23 mg/dL   Creatinine, Ser 6.96  (H) 0.44 - 1.00 mg/dL   Calcium  8.6 (L) 8.9 - 10.3 mg/dL   Total Protein 8.0 6.5 - 8.1 g/dL   Albumin  3.2 (L) 3.5 - 5.0 g/dL   AST 29 15 - 41 U/L   ALT 11 0 - 44 U/L   Alkaline Phosphatase 144 (H) 38 - 126 U/L   Total Bilirubin 0.4 0.0 - 1.2 mg/dL   GFR, Estimated 18 (L) >60 mL/min   Anion gap 16 (H) 5 - 15  Brain natriuretic peptide   Collection Time: 08/28/23  6:16 PM  Result Value Ref Range   B Natriuretic Peptide 150.9 (H) 0.0 - 100.0 pg/mL  Lipase, blood   Collection Time: 08/28/23  6:16 PM  Result Value Ref Range   Lipase 60 (H) 11 - 51 U/L  Troponin I (High Sensitivity)   Collection Time: 08/28/23  6:16 PM  Result Value Ref Range   Troponin I (High Sensitivity) 16 <18 ng/L  Troponin I (High Sensitivity)   Collection Time: 08/28/23  6:44 PM  Result Value Ref Range   Troponin I (High Sensitivity) 18 (H) <18 ng/L   *Note: Due to a large number of results and/or encounters for the requested time period, some results have not been displayed. A complete set of results can be found in Results Review.     EKG EKG Interpretation Date/Time:  Wednesday August 28 2023 16:59:47 EDT Ventricular Rate:  84 PR Interval:  132 QRS Duration:  136 QT Interval:  426 QTC Calculation: 503 R Axis:   -15  Text Interpretation:  Normal sinus rhythm Right bundle branch block Abnormal ECG When compared with ECG of 26-Jun-2023 07:58, PREVIOUS ECG IS PRESENT no change from previous Confirmed by [name] [phone]) on 08/28/2023 5:15:03 PM  Radiology DG Chest 2 View Result Date: 08/28/2023 CLINICAL DATA:  Chest pain, shortness of breath. EXAM: CHEST - 2 VIEW COMPARISON:  July 14, 2023. FINDINGS: The heart size and mediastinal contours are within normal limits. Right internal jugular dialysis catheter is unchanged. Both lungs are clear. The visualized skeletal structures are unremarkable. IMPRESSION: No active cardiopulmonary disease. Electronically Signed   By: Rosalene Colon M.D.   On:  08/28/2023 17:51    Procedures Procedures    Medications Ordered in ED Medications - No data to display  ED Course/ Medical Decision Making/ A&P                                 Medical Decision Making This patient presents to the ED for concern of chest pain, this involves an extensive number of treatment options, and is a complaint that carries with it a high risk of complications and morbidity.  The differential diagnosis includes ACS, PE, PTX, PNA, MSK, GERD   Co morbidities that complicate the patient evaluation  ESRD-HD (starting April), HTN, CAD, CVA, T2DM, CHF, SMO, hyperparathyroidism   Additional history obtained:  Additional history and/or information obtained from chart review, notable for Charted h/o coronary stents x 2, unknown date   Lab Tests:  I Ordered, and personally interpreted labs.  The pertinent results include:   Troponin 16, 18 BNP 150.9 Lactic acid 1.4 Cmet: Cr 2.61, Calcium  8.6, Alk phos 144, Albu 3.2,  CBC: no leukocytosis, hgb 10.4, normal plts    Imaging Studies ordered:  I ordered imaging studies including CXR I independently visualized and interpreted imaging which showed NAD I agree with the radiologist interpretation   Cardiac Monitoring:  The patient was maintained on a cardiac monitor.  I personally viewed and interpreted the cardiac monitored which showed an underlying rhythm of:  EKG Interpretation Date/Time:  Wednesday August 28 2023 16:59:47 EDT Ventricular Rate:  84 PR Interval:  132 QRS Duration:  136 QT Interval:  426 QTC Calculation: 503 R Axis:   -15  Text Interpretation: Normal sinus rhythm Right bundle branch block Abnormal ECG When compared with ECG of 26-Jun-2023 07:58, PREVIOUS ECG IS PRESENT no change from previous Confirmed by [name] [phone] on 08/28/2023 5:15:03 PM   Medicines ordered and prescription drug management:  I ordered medication including n/a  for n/a Reevaluation of the patient after  these medicines showed that the patient stayed the same - asymptomatic I have reviewed the patients home medicines and have made adjustments as needed   Test Considered:  Na/   Critical Interventions:  N/a   Consultations Obtained:  I requested consultation with the n/a,  and discussed lab and imaging findings as well as pertinent plan - they recommend: n/a   Problem List / ED Course:  Chest pain while at dialysis, with SOB Stopped prior to arrival to ED Patient is relatively new to dialysis - no previous adverse symptoms during treatment VSS - labs reassuring No EKG ischemia, no change from previous She remains asymptomatic in the ED She is felt stable for discharge home Follow up PCP, cardiology as appropriate.   Reevaluation:  After the interventions noted above, I reevaluated the patient and found that they have :stayed the same -  asymptomatic   Social Determinants of Health:  Never a smoker   Disposition:  After consideration of the diagnostic results and the patients response to treatment, I feel that the patient would benefit from discharge home.   Amount and/or Complexity of Data Reviewed Labs: ordered. Radiology: ordered.           Final Clinical Impression(s) / ED Diagnoses Final diagnoses:  Nonspecific chest pain    Rx / DC Orders ED Discharge Orders     None         Rama Burkitt 08/28/23 2104    Sallyanne Creamer, DO 09/02/23 1130

## 2023-08-28 NOTE — ED Provider Triage Note (Signed)
 Emergency Medicine Provider Triage Evaluation Note  [name] , a 78 y.o. female  was evaluated in triage.  Pt complains of chest pain and shortness of breath.  Patient believes symptoms started in dialysis today.  Pain is mostly on the left upper and central chest.  She made it through whole session of dialysis.  Patient denies she has had recent fever or productive cough.  Denies swelling or pain in the legs.  Patient does not typically experience chest pain.  Pressures have been running high since dialysis.  Review of Systems  Positive: Chest pain and shortness of breath Negative: Fever or productive cough  Physical Exam  BP (!) 182/83   Pulse 84   Temp 97.6 F (36.4 C) (Oral)   Resp 15   SpO2 100%  Gen:   Awake, no distress patient is fatigued in appearance but nontoxic in no acute respiratory distress. Resp:  Normal effort lungs clear MSK:   Moves extremities without difficulty no peripheral edema or calf tenderness Other:    Medical Decision Making  Medically screening exam initiated at 5:20 PM.  Appropriate orders placed.  [name] was informed that the remainder of the evaluation will be completed by another provider, this initial triage assessment does not replace that evaluation, and the importance of remaining in the ED until their evaluation is complete.     Katie Heckle, MD 08/28/23 [phone]

## 2023-08-28 NOTE — ED Triage Notes (Signed)
 Pt BIB [facility] from dialysis with reports of left sided chest pain and SHOB. Pt denies any radiating pain or nausea. Pt had 3.5 hrs of dialysis today.

## 2023-08-28 NOTE — Progress Notes (Signed)
 POST OPERATIVE OFFICE NOTE    CC:  F/u for surgery  HPI:  [name] is a 78 y.o. female who is here for postop visit.  She recently underwent excision of left upper arm AV graft and vein patch angioplasty of the left brachial artery on 07/01/2023 by Dr. Fulton Job.  This was done for steal syndrome after AV graft placement on 06/21/2023.   She returns today for follow-up.  She says that she is doing much better after surgery.  She now has all of her motor and sensation back in her left hand after surgery.  She denies any left hand pain.  She denies any issues with her left arm incisions such as drainage or tenderness.  She currently dialyzes on MWF via [facility].  Allergies  Allergen Reactions   Anesthetics, Halogenated Other (See Comments)    Per patient's daughter, she can not tolerate anesthetics.    Etomidate  Other (See Comments)    Heart stopped during anesthesia   Fentanyl  Other (See Comments)    Heart stopped during anesthesia    Percocet [Oxycodone -Acetaminophen ] Other (See Comments)    Headaches    Diazepam Other (See Comments)    Agitation   Lorazepam  Nausea Only   Tramadol  Hcl Nausea And Vomiting   Haloperidol Nausea And Vomiting    **Haldol**   Morphine  Sulfate Nausea Only    agitation   Propoxyphene Nausea And Vomiting    **Darvocet** no longer available   Versed  [Midazolam ] Other (See Comments)    Heart stopped during anesthesia    Current Outpatient Medications  Medication Sig Dispense Refill   acetaminophen  (TYLENOL ) 325 MG tablet Take 2 tablets (650 mg total) by mouth every 6 (six) hours as needed for mild pain (pain score 1-3) or headache.     apixaban  (ELIQUIS ) 2.5 MG TABS tablet Take 1 tablet (2.5 mg total) by mouth 2 (two) times daily.     B Complex-C (B-COMPLEX WITH VITAMIN C) tablet Take 1 tablet by mouth daily.     calcium  carbonate (TUMS - DOSED IN MG ELEMENTAL CALCIUM ) 500 MG chewable tablet Chew 1 tablet (200 mg of elemental calcium  total) by mouth 3  (three) times daily as needed for indigestion or heartburn.     cefTAZidime  0.5 g in dextrose  5 % 50 mL Inject 0.5 g into the vein daily. Thorough May 30th     cholecalciferol  (CHOLECALCIFEROL ) 25 MCG tablet Take 1 tablet (1,000 Units total) by mouth daily.     colestipol  (COLESTID ) 1 g tablet Take 1 tablet (1 g total) by mouth 3 (three) times daily. 90 tablet 2   diclofenac  (FLECTOR ) 1.3 % PTCH Place 1 patch onto the skin 2 (two) times daily.     diclofenac  Sodium (VOLTAREN ) 1 % GEL Apply 2 g topically 3 (three) times daily as needed (right knee).     FLUoxetine  (PROZAC ) 20 MG capsule Take 20 mg by mouth daily.     gabapentin  (NEURONTIN ) 300 MG capsule Take 300 mg by mouth at bedtime.     l-methylfolate-B6-B12 (METANX) 3-35-2 MG TABS tablet Take 1 tablet by mouth daily.     lipase/protease/amylase (CREON ) 36000 UNITS CPEP capsule Take 1 capsule (36,000 Units total) by mouth 3 (three) times daily before meals. 200 capsule 2   melatonin 3 MG TABS tablet Take 1 tablet (3 mg total) by mouth at bedtime as needed. 30 tablet 0   memantine  (NAMENDA ) 5 MG tablet Take 1 tablet (5 mg total) by mouth daily.  mirtazapine  (REMERON ) 7.5 MG tablet Take 7.5 mg by mouth at bedtime.     Mouthwashes (MOUTH RINSE) LIQD solution 15 mLs by Mouth Rinse route as needed (for oral care).     multivitamin (RENA-VIT) TABS tablet Take 1 tablet by mouth at bedtime.     oxyCODONE  (OXY IR/ROXICODONE ) 5 MG immediate release tablet Take 1 tablet (5 mg total) by mouth every 6 (six) hours as needed for severe pain (pain score 7-10). 10 tablet 0   pantoprazole  (PROTONIX ) 40 MG tablet Take 1 tablet (40 mg total) by mouth 2 (two) times daily. 60 tablet 0   prochlorperazine  (COMPAZINE ) 5 MG tablet Take 1-2 tablets (5-10 mg total) by mouth every 6 (six) hours as needed for nausea.     senna (SENOKOT) 8.6 MG TABS tablet Take 2 tablets (17.2 mg total) by mouth daily.     sevelamer  carbonate (RENVELA ) 800 MG tablet Take 2 tablets (1,600  mg total) by mouth 3 (three) times daily with meals.     torsemide  (DEMADEX ) 100 MG tablet Daily on Sun, Tue, Thurs, Sat.     vancomycin  (VANCOREADY) 750 MG/150ML SOLN Inject 150 mLs (750 mg total) into the vein every Monday, Wednesday, and Friday with hemodialysis. Continue through May 30th.     rivastigmine  (EXELON ) 1.5 MG capsule Take 1 capsule (1.5 mg total) by mouth 2 (two) times daily. 60 capsule 6   No current facility-administered medications for this visit.     ROS:  See HPI  Physical Exam:   Incision: Left arm incisions well-healed without drainage, erythema, or tenderness Extremities: Palpable left radial pulse.  No edema in LUE Neuro: Intact motor and sensation of left upper extremity    Assessment/Plan:  This is a 78 y.o. female who is here for postop visit  - The patient recently underwent excision of a left upper arm AV graft with vein patch angioplasty of the brachial artery.  This was done for steal syndrome in the left upper extremity after AV graft placement -Her left arm incisions are well-healed without signs of infection or hematoma - Her left hand is well-perfused with intact motor and sensation.  She denies any pain in the left hand.  She has a palpable left radial pulse. -I have explained to the patient that we would avoid creating any further dialysis access in her left arm given her history of steal.  We will likely have to move to the right arm for future dialysis access options.  Previous vein mapping did not demonstrate any sizable veins in the right upper extremity.  I have made the patient aware that future surgery would likely be a right upper arm AV graft. - At this time the patient would like to wait before planning for any future surgeries.  She is also currently healing from a right knee irrigation and debridement with concern for septic arthritis.  She will call our office in the future to let us  know if she is agreeable to scheduling right upper arm AV  graft creation.    Deneise Finlay, PA-C [facility] [phone]   Clinic MD:  Edgardo Goodwill

## 2023-08-29 DIAGNOSIS — I132 Hypertensive heart and chronic kidney disease with heart failure and with stage 5 chronic kidney disease, or end stage renal disease: Secondary | ICD-10-CM | POA: Diagnosis not present

## 2023-08-29 DIAGNOSIS — F321 Major depressive disorder, single episode, moderate: Secondary | ICD-10-CM | POA: Diagnosis not present

## 2023-08-29 DIAGNOSIS — E1122 Type 2 diabetes mellitus with diabetic chronic kidney disease: Secondary | ICD-10-CM | POA: Diagnosis not present

## 2023-08-29 DIAGNOSIS — Z992 Dependence on renal dialysis: Secondary | ICD-10-CM | POA: Diagnosis not present

## 2023-08-29 DIAGNOSIS — N2581 Secondary hyperparathyroidism of renal origin: Secondary | ICD-10-CM | POA: Diagnosis not present

## 2023-08-29 DIAGNOSIS — E1140 Type 2 diabetes mellitus with diabetic neuropathy, unspecified: Secondary | ICD-10-CM | POA: Diagnosis not present

## 2023-08-29 DIAGNOSIS — I509 Heart failure, unspecified: Secondary | ICD-10-CM | POA: Diagnosis not present

## 2023-08-29 DIAGNOSIS — N186 End stage renal disease: Secondary | ICD-10-CM | POA: Diagnosis not present

## 2023-08-29 DIAGNOSIS — D631 Anemia in chronic kidney disease: Secondary | ICD-10-CM | POA: Diagnosis not present

## 2023-08-30 DIAGNOSIS — Z992 Dependence on renal dialysis: Secondary | ICD-10-CM | POA: Diagnosis not present

## 2023-08-30 DIAGNOSIS — N186 End stage renal disease: Secondary | ICD-10-CM | POA: Diagnosis not present

## 2023-08-30 DIAGNOSIS — N2581 Secondary hyperparathyroidism of renal origin: Secondary | ICD-10-CM | POA: Diagnosis not present

## 2023-09-02 DIAGNOSIS — N2581 Secondary hyperparathyroidism of renal origin: Secondary | ICD-10-CM | POA: Diagnosis not present

## 2023-09-02 DIAGNOSIS — N186 End stage renal disease: Secondary | ICD-10-CM | POA: Diagnosis not present

## 2023-09-02 DIAGNOSIS — Z992 Dependence on renal dialysis: Secondary | ICD-10-CM | POA: Diagnosis not present

## 2023-09-04 DIAGNOSIS — Z992 Dependence on renal dialysis: Secondary | ICD-10-CM | POA: Diagnosis not present

## 2023-09-04 DIAGNOSIS — N2581 Secondary hyperparathyroidism of renal origin: Secondary | ICD-10-CM | POA: Diagnosis not present

## 2023-09-04 DIAGNOSIS — N186 End stage renal disease: Secondary | ICD-10-CM | POA: Diagnosis not present

## 2023-09-05 DIAGNOSIS — Z992 Dependence on renal dialysis: Secondary | ICD-10-CM | POA: Diagnosis not present

## 2023-09-05 DIAGNOSIS — F321 Major depressive disorder, single episode, moderate: Secondary | ICD-10-CM | POA: Diagnosis not present

## 2023-09-05 DIAGNOSIS — D631 Anemia in chronic kidney disease: Secondary | ICD-10-CM | POA: Diagnosis not present

## 2023-09-05 DIAGNOSIS — I509 Heart failure, unspecified: Secondary | ICD-10-CM | POA: Diagnosis not present

## 2023-09-05 DIAGNOSIS — E1140 Type 2 diabetes mellitus with diabetic neuropathy, unspecified: Secondary | ICD-10-CM | POA: Diagnosis not present

## 2023-09-05 DIAGNOSIS — I132 Hypertensive heart and chronic kidney disease with heart failure and with stage 5 chronic kidney disease, or end stage renal disease: Secondary | ICD-10-CM | POA: Diagnosis not present

## 2023-09-05 DIAGNOSIS — E1122 Type 2 diabetes mellitus with diabetic chronic kidney disease: Secondary | ICD-10-CM | POA: Diagnosis not present

## 2023-09-05 DIAGNOSIS — N186 End stage renal disease: Secondary | ICD-10-CM | POA: Diagnosis not present

## 2023-09-05 DIAGNOSIS — F322 Major depressive disorder, single episode, severe without psychotic features: Secondary | ICD-10-CM | POA: Diagnosis not present

## 2023-09-05 DIAGNOSIS — R4189 Other symptoms and signs involving cognitive functions and awareness: Secondary | ICD-10-CM | POA: Diagnosis not present

## 2023-09-05 DIAGNOSIS — N2581 Secondary hyperparathyroidism of renal origin: Secondary | ICD-10-CM | POA: Diagnosis not present

## 2023-09-06 DIAGNOSIS — Z992 Dependence on renal dialysis: Secondary | ICD-10-CM | POA: Diagnosis not present

## 2023-09-06 DIAGNOSIS — N2581 Secondary hyperparathyroidism of renal origin: Secondary | ICD-10-CM | POA: Diagnosis not present

## 2023-09-06 DIAGNOSIS — N186 End stage renal disease: Secondary | ICD-10-CM | POA: Diagnosis not present

## 2023-09-09 DIAGNOSIS — N2581 Secondary hyperparathyroidism of renal origin: Secondary | ICD-10-CM | POA: Diagnosis not present

## 2023-09-09 DIAGNOSIS — N186 End stage renal disease: Secondary | ICD-10-CM | POA: Diagnosis not present

## 2023-09-09 DIAGNOSIS — Z992 Dependence on renal dialysis: Secondary | ICD-10-CM | POA: Diagnosis not present

## 2023-09-11 DIAGNOSIS — N186 End stage renal disease: Secondary | ICD-10-CM | POA: Diagnosis not present

## 2023-09-11 DIAGNOSIS — N2581 Secondary hyperparathyroidism of renal origin: Secondary | ICD-10-CM | POA: Diagnosis not present

## 2023-09-11 DIAGNOSIS — Z992 Dependence on renal dialysis: Secondary | ICD-10-CM | POA: Diagnosis not present

## 2023-09-12 DIAGNOSIS — E1140 Type 2 diabetes mellitus with diabetic neuropathy, unspecified: Secondary | ICD-10-CM | POA: Diagnosis not present

## 2023-09-12 DIAGNOSIS — F321 Major depressive disorder, single episode, moderate: Secondary | ICD-10-CM | POA: Diagnosis not present

## 2023-09-12 DIAGNOSIS — I509 Heart failure, unspecified: Secondary | ICD-10-CM | POA: Diagnosis not present

## 2023-09-12 DIAGNOSIS — N186 End stage renal disease: Secondary | ICD-10-CM | POA: Diagnosis not present

## 2023-09-12 DIAGNOSIS — E1122 Type 2 diabetes mellitus with diabetic chronic kidney disease: Secondary | ICD-10-CM | POA: Diagnosis not present

## 2023-09-12 DIAGNOSIS — N2581 Secondary hyperparathyroidism of renal origin: Secondary | ICD-10-CM | POA: Diagnosis not present

## 2023-09-12 DIAGNOSIS — Z992 Dependence on renal dialysis: Secondary | ICD-10-CM | POA: Diagnosis not present

## 2023-09-12 DIAGNOSIS — I132 Hypertensive heart and chronic kidney disease with heart failure and with stage 5 chronic kidney disease, or end stage renal disease: Secondary | ICD-10-CM | POA: Diagnosis not present

## 2023-09-12 DIAGNOSIS — D631 Anemia in chronic kidney disease: Secondary | ICD-10-CM | POA: Diagnosis not present

## 2023-09-13 DIAGNOSIS — Z992 Dependence on renal dialysis: Secondary | ICD-10-CM | POA: Diagnosis not present

## 2023-09-13 DIAGNOSIS — N186 End stage renal disease: Secondary | ICD-10-CM | POA: Diagnosis not present

## 2023-09-13 DIAGNOSIS — N2581 Secondary hyperparathyroidism of renal origin: Secondary | ICD-10-CM | POA: Diagnosis not present

## 2023-09-16 DIAGNOSIS — Z992 Dependence on renal dialysis: Secondary | ICD-10-CM | POA: Diagnosis not present

## 2023-09-16 DIAGNOSIS — N2581 Secondary hyperparathyroidism of renal origin: Secondary | ICD-10-CM | POA: Diagnosis not present

## 2023-09-16 DIAGNOSIS — I129 Hypertensive chronic kidney disease with stage 1 through stage 4 chronic kidney disease, or unspecified chronic kidney disease: Secondary | ICD-10-CM | POA: Diagnosis not present

## 2023-09-16 DIAGNOSIS — N186 End stage renal disease: Secondary | ICD-10-CM | POA: Diagnosis not present

## 2023-09-17 DIAGNOSIS — M00861 Arthritis due to other bacteria, right knee: Secondary | ICD-10-CM | POA: Diagnosis not present

## 2023-09-18 DIAGNOSIS — N186 End stage renal disease: Secondary | ICD-10-CM | POA: Diagnosis not present

## 2023-09-18 DIAGNOSIS — Z992 Dependence on renal dialysis: Secondary | ICD-10-CM | POA: Diagnosis not present

## 2023-09-18 DIAGNOSIS — N2581 Secondary hyperparathyroidism of renal origin: Secondary | ICD-10-CM | POA: Diagnosis not present

## 2023-09-19 DIAGNOSIS — N186 End stage renal disease: Secondary | ICD-10-CM | POA: Diagnosis not present

## 2023-09-19 DIAGNOSIS — Z992 Dependence on renal dialysis: Secondary | ICD-10-CM | POA: Diagnosis not present

## 2023-09-19 DIAGNOSIS — E1140 Type 2 diabetes mellitus with diabetic neuropathy, unspecified: Secondary | ICD-10-CM | POA: Diagnosis not present

## 2023-09-19 DIAGNOSIS — I509 Heart failure, unspecified: Secondary | ICD-10-CM | POA: Diagnosis not present

## 2023-09-19 DIAGNOSIS — N2581 Secondary hyperparathyroidism of renal origin: Secondary | ICD-10-CM | POA: Diagnosis not present

## 2023-09-19 DIAGNOSIS — D631 Anemia in chronic kidney disease: Secondary | ICD-10-CM | POA: Diagnosis not present

## 2023-09-19 DIAGNOSIS — E1122 Type 2 diabetes mellitus with diabetic chronic kidney disease: Secondary | ICD-10-CM | POA: Diagnosis not present

## 2023-09-19 DIAGNOSIS — F321 Major depressive disorder, single episode, moderate: Secondary | ICD-10-CM | POA: Diagnosis not present

## 2023-09-19 DIAGNOSIS — I132 Hypertensive heart and chronic kidney disease with heart failure and with stage 5 chronic kidney disease, or end stage renal disease: Secondary | ICD-10-CM | POA: Diagnosis not present

## 2023-09-20 DIAGNOSIS — Z992 Dependence on renal dialysis: Secondary | ICD-10-CM | POA: Diagnosis not present

## 2023-09-20 DIAGNOSIS — N186 End stage renal disease: Secondary | ICD-10-CM | POA: Diagnosis not present

## 2023-09-20 DIAGNOSIS — N2581 Secondary hyperparathyroidism of renal origin: Secondary | ICD-10-CM | POA: Diagnosis not present

## 2023-09-23 DIAGNOSIS — Z992 Dependence on renal dialysis: Secondary | ICD-10-CM | POA: Diagnosis not present

## 2023-09-23 DIAGNOSIS — N186 End stage renal disease: Secondary | ICD-10-CM | POA: Diagnosis not present

## 2023-09-23 DIAGNOSIS — N2581 Secondary hyperparathyroidism of renal origin: Secondary | ICD-10-CM | POA: Diagnosis not present

## 2023-09-25 DIAGNOSIS — N186 End stage renal disease: Secondary | ICD-10-CM | POA: Diagnosis not present

## 2023-09-25 DIAGNOSIS — Z992 Dependence on renal dialysis: Secondary | ICD-10-CM | POA: Diagnosis not present

## 2023-09-25 DIAGNOSIS — N2581 Secondary hyperparathyroidism of renal origin: Secondary | ICD-10-CM | POA: Diagnosis not present

## 2023-09-26 DIAGNOSIS — N2581 Secondary hyperparathyroidism of renal origin: Secondary | ICD-10-CM | POA: Diagnosis not present

## 2023-09-26 DIAGNOSIS — Z992 Dependence on renal dialysis: Secondary | ICD-10-CM | POA: Diagnosis not present

## 2023-09-26 DIAGNOSIS — N186 End stage renal disease: Secondary | ICD-10-CM | POA: Diagnosis not present

## 2023-09-26 DIAGNOSIS — I509 Heart failure, unspecified: Secondary | ICD-10-CM | POA: Diagnosis not present

## 2023-09-26 DIAGNOSIS — E1122 Type 2 diabetes mellitus with diabetic chronic kidney disease: Secondary | ICD-10-CM | POA: Diagnosis not present

## 2023-09-26 DIAGNOSIS — F321 Major depressive disorder, single episode, moderate: Secondary | ICD-10-CM | POA: Diagnosis not present

## 2023-09-26 DIAGNOSIS — E1140 Type 2 diabetes mellitus with diabetic neuropathy, unspecified: Secondary | ICD-10-CM | POA: Diagnosis not present

## 2023-09-26 DIAGNOSIS — D631 Anemia in chronic kidney disease: Secondary | ICD-10-CM | POA: Diagnosis not present

## 2023-09-26 DIAGNOSIS — I132 Hypertensive heart and chronic kidney disease with heart failure and with stage 5 chronic kidney disease, or end stage renal disease: Secondary | ICD-10-CM | POA: Diagnosis not present

## 2023-09-27 DIAGNOSIS — N186 End stage renal disease: Secondary | ICD-10-CM | POA: Diagnosis not present

## 2023-09-27 DIAGNOSIS — N2581 Secondary hyperparathyroidism of renal origin: Secondary | ICD-10-CM | POA: Diagnosis not present

## 2023-09-27 DIAGNOSIS — Z992 Dependence on renal dialysis: Secondary | ICD-10-CM | POA: Diagnosis not present

## 2023-09-28 DIAGNOSIS — D631 Anemia in chronic kidney disease: Secondary | ICD-10-CM | POA: Diagnosis not present

## 2023-09-28 DIAGNOSIS — I509 Heart failure, unspecified: Secondary | ICD-10-CM | POA: Diagnosis not present

## 2023-09-28 DIAGNOSIS — E1122 Type 2 diabetes mellitus with diabetic chronic kidney disease: Secondary | ICD-10-CM | POA: Diagnosis not present

## 2023-09-28 DIAGNOSIS — I132 Hypertensive heart and chronic kidney disease with heart failure and with stage 5 chronic kidney disease, or end stage renal disease: Secondary | ICD-10-CM | POA: Diagnosis not present

## 2023-09-28 DIAGNOSIS — E1140 Type 2 diabetes mellitus with diabetic neuropathy, unspecified: Secondary | ICD-10-CM | POA: Diagnosis not present

## 2023-09-28 DIAGNOSIS — F321 Major depressive disorder, single episode, moderate: Secondary | ICD-10-CM | POA: Diagnosis not present

## 2023-09-28 DIAGNOSIS — N2581 Secondary hyperparathyroidism of renal origin: Secondary | ICD-10-CM | POA: Diagnosis not present

## 2023-09-28 DIAGNOSIS — N186 End stage renal disease: Secondary | ICD-10-CM | POA: Diagnosis not present

## 2023-09-28 DIAGNOSIS — Z992 Dependence on renal dialysis: Secondary | ICD-10-CM | POA: Diagnosis not present

## 2023-09-30 DIAGNOSIS — Z992 Dependence on renal dialysis: Secondary | ICD-10-CM | POA: Diagnosis not present

## 2023-09-30 DIAGNOSIS — N2581 Secondary hyperparathyroidism of renal origin: Secondary | ICD-10-CM | POA: Diagnosis not present

## 2023-09-30 DIAGNOSIS — N186 End stage renal disease: Secondary | ICD-10-CM | POA: Diagnosis not present

## 2023-10-01 DIAGNOSIS — Z992 Dependence on renal dialysis: Secondary | ICD-10-CM | POA: Diagnosis not present

## 2023-10-01 DIAGNOSIS — N2581 Secondary hyperparathyroidism of renal origin: Secondary | ICD-10-CM | POA: Diagnosis not present

## 2023-10-01 DIAGNOSIS — N186 End stage renal disease: Secondary | ICD-10-CM | POA: Diagnosis not present

## 2023-10-02 DIAGNOSIS — Z992 Dependence on renal dialysis: Secondary | ICD-10-CM | POA: Diagnosis not present

## 2023-10-02 DIAGNOSIS — N2581 Secondary hyperparathyroidism of renal origin: Secondary | ICD-10-CM | POA: Diagnosis not present

## 2023-10-02 DIAGNOSIS — N186 End stage renal disease: Secondary | ICD-10-CM | POA: Diagnosis not present

## 2023-10-03 DIAGNOSIS — E1140 Type 2 diabetes mellitus with diabetic neuropathy, unspecified: Secondary | ICD-10-CM | POA: Diagnosis not present

## 2023-10-03 DIAGNOSIS — N186 End stage renal disease: Secondary | ICD-10-CM | POA: Diagnosis not present

## 2023-10-03 DIAGNOSIS — N2581 Secondary hyperparathyroidism of renal origin: Secondary | ICD-10-CM | POA: Diagnosis not present

## 2023-10-03 DIAGNOSIS — F321 Major depressive disorder, single episode, moderate: Secondary | ICD-10-CM | POA: Diagnosis not present

## 2023-10-03 DIAGNOSIS — I509 Heart failure, unspecified: Secondary | ICD-10-CM | POA: Diagnosis not present

## 2023-10-03 DIAGNOSIS — E1122 Type 2 diabetes mellitus with diabetic chronic kidney disease: Secondary | ICD-10-CM | POA: Diagnosis not present

## 2023-10-03 DIAGNOSIS — Z992 Dependence on renal dialysis: Secondary | ICD-10-CM | POA: Diagnosis not present

## 2023-10-03 DIAGNOSIS — I132 Hypertensive heart and chronic kidney disease with heart failure and with stage 5 chronic kidney disease, or end stage renal disease: Secondary | ICD-10-CM | POA: Diagnosis not present

## 2023-10-03 DIAGNOSIS — D631 Anemia in chronic kidney disease: Secondary | ICD-10-CM | POA: Diagnosis not present

## 2023-10-04 DIAGNOSIS — N2581 Secondary hyperparathyroidism of renal origin: Secondary | ICD-10-CM | POA: Diagnosis not present

## 2023-10-04 DIAGNOSIS — Z992 Dependence on renal dialysis: Secondary | ICD-10-CM | POA: Diagnosis not present

## 2023-10-04 DIAGNOSIS — N186 End stage renal disease: Secondary | ICD-10-CM | POA: Diagnosis not present

## 2023-10-07 DIAGNOSIS — N186 End stage renal disease: Secondary | ICD-10-CM | POA: Diagnosis not present

## 2023-10-07 DIAGNOSIS — N2581 Secondary hyperparathyroidism of renal origin: Secondary | ICD-10-CM | POA: Diagnosis not present

## 2023-10-07 DIAGNOSIS — Z992 Dependence on renal dialysis: Secondary | ICD-10-CM | POA: Diagnosis not present

## 2023-10-08 DIAGNOSIS — E1122 Type 2 diabetes mellitus with diabetic chronic kidney disease: Secondary | ICD-10-CM | POA: Diagnosis not present

## 2023-10-08 DIAGNOSIS — I132 Hypertensive heart and chronic kidney disease with heart failure and with stage 5 chronic kidney disease, or end stage renal disease: Secondary | ICD-10-CM | POA: Diagnosis not present

## 2023-10-08 DIAGNOSIS — D631 Anemia in chronic kidney disease: Secondary | ICD-10-CM | POA: Diagnosis not present

## 2023-10-08 DIAGNOSIS — N2581 Secondary hyperparathyroidism of renal origin: Secondary | ICD-10-CM | POA: Diagnosis not present

## 2023-10-08 DIAGNOSIS — N186 End stage renal disease: Secondary | ICD-10-CM | POA: Diagnosis not present

## 2023-10-08 DIAGNOSIS — F321 Major depressive disorder, single episode, moderate: Secondary | ICD-10-CM | POA: Diagnosis not present

## 2023-10-08 DIAGNOSIS — I509 Heart failure, unspecified: Secondary | ICD-10-CM | POA: Diagnosis not present

## 2023-10-08 DIAGNOSIS — E1140 Type 2 diabetes mellitus with diabetic neuropathy, unspecified: Secondary | ICD-10-CM | POA: Diagnosis not present

## 2023-10-08 DIAGNOSIS — Z992 Dependence on renal dialysis: Secondary | ICD-10-CM | POA: Diagnosis not present

## 2023-10-09 DIAGNOSIS — N186 End stage renal disease: Secondary | ICD-10-CM | POA: Diagnosis not present

## 2023-10-09 DIAGNOSIS — Z992 Dependence on renal dialysis: Secondary | ICD-10-CM | POA: Diagnosis not present

## 2023-10-09 DIAGNOSIS — N2581 Secondary hyperparathyroidism of renal origin: Secondary | ICD-10-CM | POA: Diagnosis not present

## 2023-10-10 DIAGNOSIS — I132 Hypertensive heart and chronic kidney disease with heart failure and with stage 5 chronic kidney disease, or end stage renal disease: Secondary | ICD-10-CM | POA: Diagnosis not present

## 2023-10-10 DIAGNOSIS — D631 Anemia in chronic kidney disease: Secondary | ICD-10-CM | POA: Diagnosis not present

## 2023-10-10 DIAGNOSIS — F321 Major depressive disorder, single episode, moderate: Secondary | ICD-10-CM | POA: Diagnosis not present

## 2023-10-10 DIAGNOSIS — N2581 Secondary hyperparathyroidism of renal origin: Secondary | ICD-10-CM | POA: Diagnosis not present

## 2023-10-10 DIAGNOSIS — E1140 Type 2 diabetes mellitus with diabetic neuropathy, unspecified: Secondary | ICD-10-CM | POA: Diagnosis not present

## 2023-10-10 DIAGNOSIS — Z992 Dependence on renal dialysis: Secondary | ICD-10-CM | POA: Diagnosis not present

## 2023-10-10 DIAGNOSIS — I509 Heart failure, unspecified: Secondary | ICD-10-CM | POA: Diagnosis not present

## 2023-10-10 DIAGNOSIS — E1122 Type 2 diabetes mellitus with diabetic chronic kidney disease: Secondary | ICD-10-CM | POA: Diagnosis not present

## 2023-10-10 DIAGNOSIS — N186 End stage renal disease: Secondary | ICD-10-CM | POA: Diagnosis not present

## 2023-10-11 DIAGNOSIS — N186 End stage renal disease: Secondary | ICD-10-CM | POA: Diagnosis not present

## 2023-10-11 DIAGNOSIS — N2581 Secondary hyperparathyroidism of renal origin: Secondary | ICD-10-CM | POA: Diagnosis not present

## 2023-10-11 DIAGNOSIS — Z992 Dependence on renal dialysis: Secondary | ICD-10-CM | POA: Diagnosis not present

## 2023-10-14 DIAGNOSIS — N186 End stage renal disease: Secondary | ICD-10-CM | POA: Diagnosis not present

## 2023-10-14 DIAGNOSIS — Z992 Dependence on renal dialysis: Secondary | ICD-10-CM | POA: Diagnosis not present

## 2023-10-14 DIAGNOSIS — N2581 Secondary hyperparathyroidism of renal origin: Secondary | ICD-10-CM | POA: Diagnosis not present

## 2023-10-16 DIAGNOSIS — N186 End stage renal disease: Secondary | ICD-10-CM | POA: Diagnosis not present

## 2023-10-16 DIAGNOSIS — Z992 Dependence on renal dialysis: Secondary | ICD-10-CM | POA: Diagnosis not present

## 2023-10-16 DIAGNOSIS — N2581 Secondary hyperparathyroidism of renal origin: Secondary | ICD-10-CM | POA: Diagnosis not present

## 2023-10-17 DIAGNOSIS — I132 Hypertensive heart and chronic kidney disease with heart failure and with stage 5 chronic kidney disease, or end stage renal disease: Secondary | ICD-10-CM | POA: Diagnosis not present

## 2023-10-17 DIAGNOSIS — E1140 Type 2 diabetes mellitus with diabetic neuropathy, unspecified: Secondary | ICD-10-CM | POA: Diagnosis not present

## 2023-10-17 DIAGNOSIS — Z992 Dependence on renal dialysis: Secondary | ICD-10-CM | POA: Diagnosis not present

## 2023-10-17 DIAGNOSIS — I129 Hypertensive chronic kidney disease with stage 1 through stage 4 chronic kidney disease, or unspecified chronic kidney disease: Secondary | ICD-10-CM | POA: Diagnosis not present

## 2023-10-17 DIAGNOSIS — N2581 Secondary hyperparathyroidism of renal origin: Secondary | ICD-10-CM | POA: Diagnosis not present

## 2023-10-17 DIAGNOSIS — F321 Major depressive disorder, single episode, moderate: Secondary | ICD-10-CM | POA: Diagnosis not present

## 2023-10-17 DIAGNOSIS — D631 Anemia in chronic kidney disease: Secondary | ICD-10-CM | POA: Diagnosis not present

## 2023-10-17 DIAGNOSIS — N186 End stage renal disease: Secondary | ICD-10-CM | POA: Diagnosis not present

## 2023-10-17 DIAGNOSIS — E1122 Type 2 diabetes mellitus with diabetic chronic kidney disease: Secondary | ICD-10-CM | POA: Diagnosis not present

## 2023-10-17 DIAGNOSIS — I509 Heart failure, unspecified: Secondary | ICD-10-CM | POA: Diagnosis not present

## 2023-10-18 DIAGNOSIS — Z992 Dependence on renal dialysis: Secondary | ICD-10-CM | POA: Diagnosis not present

## 2023-10-18 DIAGNOSIS — N186 End stage renal disease: Secondary | ICD-10-CM | POA: Diagnosis not present

## 2023-10-18 DIAGNOSIS — N2581 Secondary hyperparathyroidism of renal origin: Secondary | ICD-10-CM | POA: Diagnosis not present

## 2023-10-21 DIAGNOSIS — N186 End stage renal disease: Secondary | ICD-10-CM | POA: Diagnosis not present

## 2023-10-21 DIAGNOSIS — Z992 Dependence on renal dialysis: Secondary | ICD-10-CM | POA: Diagnosis not present

## 2023-10-21 DIAGNOSIS — N2581 Secondary hyperparathyroidism of renal origin: Secondary | ICD-10-CM | POA: Diagnosis not present

## 2023-10-22 DIAGNOSIS — N186 End stage renal disease: Secondary | ICD-10-CM | POA: Diagnosis not present

## 2023-10-22 DIAGNOSIS — E1122 Type 2 diabetes mellitus with diabetic chronic kidney disease: Secondary | ICD-10-CM | POA: Diagnosis not present

## 2023-10-22 DIAGNOSIS — I509 Heart failure, unspecified: Secondary | ICD-10-CM | POA: Diagnosis not present

## 2023-10-22 DIAGNOSIS — Z992 Dependence on renal dialysis: Secondary | ICD-10-CM | POA: Diagnosis not present

## 2023-10-22 DIAGNOSIS — D631 Anemia in chronic kidney disease: Secondary | ICD-10-CM | POA: Diagnosis not present

## 2023-10-22 DIAGNOSIS — F321 Major depressive disorder, single episode, moderate: Secondary | ICD-10-CM | POA: Diagnosis not present

## 2023-10-22 DIAGNOSIS — I132 Hypertensive heart and chronic kidney disease with heart failure and with stage 5 chronic kidney disease, or end stage renal disease: Secondary | ICD-10-CM | POA: Diagnosis not present

## 2023-10-22 DIAGNOSIS — E1140 Type 2 diabetes mellitus with diabetic neuropathy, unspecified: Secondary | ICD-10-CM | POA: Diagnosis not present

## 2023-10-22 DIAGNOSIS — N2581 Secondary hyperparathyroidism of renal origin: Secondary | ICD-10-CM | POA: Diagnosis not present

## 2023-10-23 DIAGNOSIS — N2581 Secondary hyperparathyroidism of renal origin: Secondary | ICD-10-CM | POA: Diagnosis not present

## 2023-10-23 DIAGNOSIS — N186 End stage renal disease: Secondary | ICD-10-CM | POA: Diagnosis not present

## 2023-10-23 DIAGNOSIS — Z992 Dependence on renal dialysis: Secondary | ICD-10-CM | POA: Diagnosis not present

## 2023-10-24 DIAGNOSIS — E1140 Type 2 diabetes mellitus with diabetic neuropathy, unspecified: Secondary | ICD-10-CM | POA: Diagnosis not present

## 2023-10-24 DIAGNOSIS — F321 Major depressive disorder, single episode, moderate: Secondary | ICD-10-CM | POA: Diagnosis not present

## 2023-10-24 DIAGNOSIS — I509 Heart failure, unspecified: Secondary | ICD-10-CM | POA: Diagnosis not present

## 2023-10-24 DIAGNOSIS — D631 Anemia in chronic kidney disease: Secondary | ICD-10-CM | POA: Diagnosis not present

## 2023-10-24 DIAGNOSIS — N186 End stage renal disease: Secondary | ICD-10-CM | POA: Diagnosis not present

## 2023-10-24 DIAGNOSIS — Z992 Dependence on renal dialysis: Secondary | ICD-10-CM | POA: Diagnosis not present

## 2023-10-24 DIAGNOSIS — N2581 Secondary hyperparathyroidism of renal origin: Secondary | ICD-10-CM | POA: Diagnosis not present

## 2023-10-24 DIAGNOSIS — E1122 Type 2 diabetes mellitus with diabetic chronic kidney disease: Secondary | ICD-10-CM | POA: Diagnosis not present

## 2023-10-24 DIAGNOSIS — I132 Hypertensive heart and chronic kidney disease with heart failure and with stage 5 chronic kidney disease, or end stage renal disease: Secondary | ICD-10-CM | POA: Diagnosis not present

## 2023-10-25 DIAGNOSIS — N2581 Secondary hyperparathyroidism of renal origin: Secondary | ICD-10-CM | POA: Diagnosis not present

## 2023-10-25 DIAGNOSIS — Z992 Dependence on renal dialysis: Secondary | ICD-10-CM | POA: Diagnosis not present

## 2023-10-25 DIAGNOSIS — N186 End stage renal disease: Secondary | ICD-10-CM | POA: Diagnosis not present

## 2023-10-28 DIAGNOSIS — N2581 Secondary hyperparathyroidism of renal origin: Secondary | ICD-10-CM | POA: Diagnosis not present

## 2023-10-28 DIAGNOSIS — N186 End stage renal disease: Secondary | ICD-10-CM | POA: Diagnosis not present

## 2023-10-28 DIAGNOSIS — Z992 Dependence on renal dialysis: Secondary | ICD-10-CM | POA: Diagnosis not present

## 2023-10-30 DIAGNOSIS — Z992 Dependence on renal dialysis: Secondary | ICD-10-CM | POA: Diagnosis not present

## 2023-10-30 DIAGNOSIS — N2581 Secondary hyperparathyroidism of renal origin: Secondary | ICD-10-CM | POA: Diagnosis not present

## 2023-10-30 DIAGNOSIS — N186 End stage renal disease: Secondary | ICD-10-CM | POA: Diagnosis not present

## 2023-11-01 DIAGNOSIS — N186 End stage renal disease: Secondary | ICD-10-CM | POA: Diagnosis not present

## 2023-11-01 DIAGNOSIS — N2581 Secondary hyperparathyroidism of renal origin: Secondary | ICD-10-CM | POA: Diagnosis not present

## 2023-11-01 DIAGNOSIS — Z992 Dependence on renal dialysis: Secondary | ICD-10-CM | POA: Diagnosis not present

## 2023-11-04 DIAGNOSIS — N2581 Secondary hyperparathyroidism of renal origin: Secondary | ICD-10-CM | POA: Diagnosis not present

## 2023-11-04 DIAGNOSIS — Z992 Dependence on renal dialysis: Secondary | ICD-10-CM | POA: Diagnosis not present

## 2023-11-04 DIAGNOSIS — N186 End stage renal disease: Secondary | ICD-10-CM | POA: Diagnosis not present

## 2023-11-06 DIAGNOSIS — N186 End stage renal disease: Secondary | ICD-10-CM | POA: Diagnosis not present

## 2023-11-06 DIAGNOSIS — Z992 Dependence on renal dialysis: Secondary | ICD-10-CM | POA: Diagnosis not present

## 2023-11-06 DIAGNOSIS — N2581 Secondary hyperparathyroidism of renal origin: Secondary | ICD-10-CM | POA: Diagnosis not present

## 2023-11-08 DIAGNOSIS — Z992 Dependence on renal dialysis: Secondary | ICD-10-CM | POA: Diagnosis not present

## 2023-11-08 DIAGNOSIS — N2581 Secondary hyperparathyroidism of renal origin: Secondary | ICD-10-CM | POA: Diagnosis not present

## 2023-11-08 DIAGNOSIS — N186 End stage renal disease: Secondary | ICD-10-CM | POA: Diagnosis not present

## 2023-11-11 DIAGNOSIS — Z992 Dependence on renal dialysis: Secondary | ICD-10-CM | POA: Diagnosis not present

## 2023-11-11 DIAGNOSIS — N2581 Secondary hyperparathyroidism of renal origin: Secondary | ICD-10-CM | POA: Diagnosis not present

## 2023-11-11 DIAGNOSIS — N186 End stage renal disease: Secondary | ICD-10-CM | POA: Diagnosis not present

## 2023-11-13 DIAGNOSIS — N186 End stage renal disease: Secondary | ICD-10-CM | POA: Diagnosis not present

## 2023-11-13 DIAGNOSIS — N2581 Secondary hyperparathyroidism of renal origin: Secondary | ICD-10-CM | POA: Diagnosis not present

## 2023-11-13 DIAGNOSIS — Z992 Dependence on renal dialysis: Secondary | ICD-10-CM | POA: Diagnosis not present

## 2023-11-15 DIAGNOSIS — Z992 Dependence on renal dialysis: Secondary | ICD-10-CM | POA: Diagnosis not present

## 2023-11-15 DIAGNOSIS — N2581 Secondary hyperparathyroidism of renal origin: Secondary | ICD-10-CM | POA: Diagnosis not present

## 2023-11-15 DIAGNOSIS — N186 End stage renal disease: Secondary | ICD-10-CM | POA: Diagnosis not present

## 2023-11-17 DIAGNOSIS — N186 End stage renal disease: Secondary | ICD-10-CM | POA: Diagnosis not present

## 2023-11-17 DIAGNOSIS — Z992 Dependence on renal dialysis: Secondary | ICD-10-CM | POA: Diagnosis not present

## 2023-11-17 DIAGNOSIS — I129 Hypertensive chronic kidney disease with stage 1 through stage 4 chronic kidney disease, or unspecified chronic kidney disease: Secondary | ICD-10-CM | POA: Diagnosis not present

## 2023-11-18 DIAGNOSIS — N186 End stage renal disease: Secondary | ICD-10-CM | POA: Diagnosis not present

## 2023-11-18 DIAGNOSIS — Z992 Dependence on renal dialysis: Secondary | ICD-10-CM | POA: Diagnosis not present

## 2023-11-18 DIAGNOSIS — N2581 Secondary hyperparathyroidism of renal origin: Secondary | ICD-10-CM | POA: Diagnosis not present

## 2023-11-20 DIAGNOSIS — N2581 Secondary hyperparathyroidism of renal origin: Secondary | ICD-10-CM | POA: Diagnosis not present

## 2023-11-20 DIAGNOSIS — Z992 Dependence on renal dialysis: Secondary | ICD-10-CM | POA: Diagnosis not present

## 2023-11-20 DIAGNOSIS — N186 End stage renal disease: Secondary | ICD-10-CM | POA: Diagnosis not present

## 2023-11-22 DIAGNOSIS — N186 End stage renal disease: Secondary | ICD-10-CM | POA: Diagnosis not present

## 2023-11-22 DIAGNOSIS — N2581 Secondary hyperparathyroidism of renal origin: Secondary | ICD-10-CM | POA: Diagnosis not present

## 2023-11-22 DIAGNOSIS — Z992 Dependence on renal dialysis: Secondary | ICD-10-CM | POA: Diagnosis not present

## 2023-11-25 DIAGNOSIS — N2581 Secondary hyperparathyroidism of renal origin: Secondary | ICD-10-CM | POA: Diagnosis not present

## 2023-11-25 DIAGNOSIS — N186 End stage renal disease: Secondary | ICD-10-CM | POA: Diagnosis not present

## 2023-11-25 DIAGNOSIS — Z992 Dependence on renal dialysis: Secondary | ICD-10-CM | POA: Diagnosis not present

## 2023-11-27 ENCOUNTER — Other Ambulatory Visit: Payer: Self-pay | Admitting: Neurology

## 2023-11-27 DIAGNOSIS — N2581 Secondary hyperparathyroidism of renal origin: Secondary | ICD-10-CM | POA: Diagnosis not present

## 2023-11-27 DIAGNOSIS — Z992 Dependence on renal dialysis: Secondary | ICD-10-CM | POA: Diagnosis not present

## 2023-11-27 DIAGNOSIS — N186 End stage renal disease: Secondary | ICD-10-CM | POA: Diagnosis not present

## 2023-11-28 NOTE — Telephone Encounter (Signed)
 Contacted daughter, Majel Kiang, schedule appointment with Harlene, NP due to Dr. Gregg had no availability for this year.

## 2023-11-29 DIAGNOSIS — Z992 Dependence on renal dialysis: Secondary | ICD-10-CM | POA: Diagnosis not present

## 2023-11-29 DIAGNOSIS — N2581 Secondary hyperparathyroidism of renal origin: Secondary | ICD-10-CM | POA: Diagnosis not present

## 2023-11-29 DIAGNOSIS — N186 End stage renal disease: Secondary | ICD-10-CM | POA: Diagnosis not present

## 2023-12-02 DIAGNOSIS — N186 End stage renal disease: Secondary | ICD-10-CM | POA: Diagnosis not present

## 2023-12-02 DIAGNOSIS — Z992 Dependence on renal dialysis: Secondary | ICD-10-CM | POA: Diagnosis not present

## 2023-12-02 DIAGNOSIS — N2581 Secondary hyperparathyroidism of renal origin: Secondary | ICD-10-CM | POA: Diagnosis not present

## 2023-12-02 NOTE — Telephone Encounter (Signed)
 Pt has a f/u visit scheduled.

## 2023-12-04 DIAGNOSIS — Z992 Dependence on renal dialysis: Secondary | ICD-10-CM | POA: Diagnosis not present

## 2023-12-04 DIAGNOSIS — N186 End stage renal disease: Secondary | ICD-10-CM | POA: Diagnosis not present

## 2023-12-04 DIAGNOSIS — N2581 Secondary hyperparathyroidism of renal origin: Secondary | ICD-10-CM | POA: Diagnosis not present

## 2023-12-06 DIAGNOSIS — N2581 Secondary hyperparathyroidism of renal origin: Secondary | ICD-10-CM | POA: Diagnosis not present

## 2023-12-06 DIAGNOSIS — Z992 Dependence on renal dialysis: Secondary | ICD-10-CM | POA: Diagnosis not present

## 2023-12-06 DIAGNOSIS — N186 End stage renal disease: Secondary | ICD-10-CM | POA: Diagnosis not present

## 2023-12-09 DIAGNOSIS — N2581 Secondary hyperparathyroidism of renal origin: Secondary | ICD-10-CM | POA: Diagnosis not present

## 2023-12-09 DIAGNOSIS — N186 End stage renal disease: Secondary | ICD-10-CM | POA: Diagnosis not present

## 2023-12-09 DIAGNOSIS — R5383 Other fatigue: Secondary | ICD-10-CM | POA: Diagnosis not present

## 2023-12-09 DIAGNOSIS — Z992 Dependence on renal dialysis: Secondary | ICD-10-CM | POA: Diagnosis not present

## 2023-12-09 DIAGNOSIS — R52 Pain, unspecified: Secondary | ICD-10-CM | POA: Diagnosis not present

## 2023-12-09 DIAGNOSIS — J029 Acute pharyngitis, unspecified: Secondary | ICD-10-CM | POA: Diagnosis not present

## 2023-12-09 DIAGNOSIS — Z03818 Encounter for observation for suspected exposure to other biological agents ruled out: Secondary | ICD-10-CM | POA: Diagnosis not present

## 2023-12-11 DIAGNOSIS — N186 End stage renal disease: Secondary | ICD-10-CM | POA: Diagnosis not present

## 2023-12-11 DIAGNOSIS — Z992 Dependence on renal dialysis: Secondary | ICD-10-CM | POA: Diagnosis not present

## 2023-12-11 DIAGNOSIS — N2581 Secondary hyperparathyroidism of renal origin: Secondary | ICD-10-CM | POA: Diagnosis not present

## 2023-12-13 DIAGNOSIS — N2581 Secondary hyperparathyroidism of renal origin: Secondary | ICD-10-CM | POA: Diagnosis not present

## 2023-12-13 DIAGNOSIS — N186 End stage renal disease: Secondary | ICD-10-CM | POA: Diagnosis not present

## 2023-12-13 DIAGNOSIS — Z992 Dependence on renal dialysis: Secondary | ICD-10-CM | POA: Diagnosis not present

## 2023-12-16 DIAGNOSIS — N186 End stage renal disease: Secondary | ICD-10-CM | POA: Diagnosis not present

## 2023-12-16 DIAGNOSIS — N2581 Secondary hyperparathyroidism of renal origin: Secondary | ICD-10-CM | POA: Diagnosis not present

## 2023-12-16 DIAGNOSIS — Z992 Dependence on renal dialysis: Secondary | ICD-10-CM | POA: Diagnosis not present

## 2023-12-17 DIAGNOSIS — N186 End stage renal disease: Secondary | ICD-10-CM | POA: Diagnosis not present

## 2023-12-17 DIAGNOSIS — I129 Hypertensive chronic kidney disease with stage 1 through stage 4 chronic kidney disease, or unspecified chronic kidney disease: Secondary | ICD-10-CM | POA: Diagnosis not present

## 2023-12-17 DIAGNOSIS — Z992 Dependence on renal dialysis: Secondary | ICD-10-CM | POA: Diagnosis not present

## 2023-12-18 DIAGNOSIS — N2581 Secondary hyperparathyroidism of renal origin: Secondary | ICD-10-CM | POA: Diagnosis not present

## 2023-12-18 DIAGNOSIS — Z992 Dependence on renal dialysis: Secondary | ICD-10-CM | POA: Diagnosis not present

## 2023-12-23 DIAGNOSIS — N186 End stage renal disease: Secondary | ICD-10-CM | POA: Diagnosis not present

## 2023-12-30 DIAGNOSIS — Z992 Dependence on renal dialysis: Secondary | ICD-10-CM | POA: Diagnosis not present

## 2023-12-30 DIAGNOSIS — N2581 Secondary hyperparathyroidism of renal origin: Secondary | ICD-10-CM | POA: Diagnosis not present

## 2024-01-01 DIAGNOSIS — N2581 Secondary hyperparathyroidism of renal origin: Secondary | ICD-10-CM | POA: Diagnosis not present

## 2024-01-01 DIAGNOSIS — N186 End stage renal disease: Secondary | ICD-10-CM | POA: Diagnosis not present

## 2024-01-01 DIAGNOSIS — Z992 Dependence on renal dialysis: Secondary | ICD-10-CM | POA: Diagnosis not present

## 2024-01-03 DIAGNOSIS — N2581 Secondary hyperparathyroidism of renal origin: Secondary | ICD-10-CM | POA: Diagnosis not present

## 2024-01-03 DIAGNOSIS — Z992 Dependence on renal dialysis: Secondary | ICD-10-CM | POA: Diagnosis not present

## 2024-01-03 DIAGNOSIS — N186 End stage renal disease: Secondary | ICD-10-CM | POA: Diagnosis not present

## 2024-01-06 DIAGNOSIS — Z992 Dependence on renal dialysis: Secondary | ICD-10-CM | POA: Diagnosis not present

## 2024-01-06 DIAGNOSIS — N186 End stage renal disease: Secondary | ICD-10-CM | POA: Diagnosis not present

## 2024-01-06 DIAGNOSIS — N2581 Secondary hyperparathyroidism of renal origin: Secondary | ICD-10-CM | POA: Diagnosis not present

## 2024-01-10 DIAGNOSIS — Z992 Dependence on renal dialysis: Secondary | ICD-10-CM | POA: Diagnosis not present

## 2024-01-10 DIAGNOSIS — N2581 Secondary hyperparathyroidism of renal origin: Secondary | ICD-10-CM | POA: Diagnosis not present

## 2024-01-13 DIAGNOSIS — N2581 Secondary hyperparathyroidism of renal origin: Secondary | ICD-10-CM | POA: Diagnosis not present

## 2024-01-13 DIAGNOSIS — Z992 Dependence on renal dialysis: Secondary | ICD-10-CM | POA: Diagnosis not present

## 2024-01-14 DIAGNOSIS — R0602 Shortness of breath: Secondary | ICD-10-CM | POA: Diagnosis not present

## 2024-01-15 NOTE — Progress Notes (Addendum)
 [facility]  PATIENT: [name] DOB: 07/22/1945  REQUESTING CLINICIAN: Rexanne Ingle, MD HISTORY FROM: Patient/Daughter  REASON FOR VISIT: Memory loss    HISTORICAL  CHIEF COMPLAINT:  Chief Complaint  Patient presents with   Memory Loss    Rm 3 with daughter  Pt is well and stable, reports memory is the same. No new concerns.     HISTORY OF PRESENT ILLNESS:    Update 01/16/2024 JM: Patient returns for follow-up visit after prior initial consult visit with Dr. Gregg 1 year ago.  She is accompanied by her daughter.  Reports continued gradual decline over the past year especially since this spring when she had recurrent prolonged hospitalizations and ultimately was started on dialysis.  She is currently living back home, her daughter-in-law lives with her, she is able to maintain majority of ADLs independently but daughters to assist with IADLs.  She ambulates with a rolling walker, no recent falls.  Reports she sleeps well and appetite satisfactory.  No behavioral concerns.  She feels depression is overall stable.  She remains on Exelon  1.5 mg twice daily without side effects.  Daughter questions increasing dosage.  After prior visit, lab work showed ATN profile consistent with Alzheimer's disease.  Recommended proceeding with amyloid PET scan but was never completed.  She was also referred for neuropsychology with consult visit this month but office located in [place] and unable to pursue evaluation due to transportation.  She is interested in proceeding with imaging as well as neurocognitive evaluation.  No further questions or concerns at this time.       Consult visit 12/27/2022 Dr. Gregg:  This is 78 year old woman with medical history including hypertension, hyperlipidemia, CAD, CHF, depression, who is presenting with her daughter for memory loss.  Patient tells me that her memory is not what it used to be.  She is forgetful, misplacing  items but she is independent.   Daughter tells me that patient memory starting declining in the past few years.  Two years ago, they moved her to live with her son.  She is independent but she is forgetful, forgets about recent conversation, repeat herself.  She has not been driving for the past 2 years.   Daughter also tells me that patient has been picking up calls, random calls, has been changing her medical plan, changed it 3 times in the past year.  So far no report of losing large amount of money.  She was diagnosed with depression and started on mirtazapine  and fluoxetine  and she is doing okay.  In the past couple weeks, patient lost his son and depression and memory loss have been worse since then.    TBI:  No past history of TBI Stroke:   no past history of stroke Seizures:  no past history of seizures Sleep: no history of sleep apnea.  Mood: Yes ,with depression, on Mirtazapine  and Fluoxetine  Family history of Dementia:  Denies  Functional status: independent in most ADLs and IADLs Patient lives alone. Cooking: yes, no issues Cleaning: yes Shopping: yes Bathing: no issues  Toileting: no issues  Driving: not driven in the past 2 years ago Bills: daughter  Medications: no issues  Ever left the stove on by accident?: Denies  Forget how to use items around the house?: no Getting lost going to familiar places?: no  Forgetting loved ones names?: yes Word finding difficulty? Yes  Sleep: Usually 8 hours, but not good recently due to loss of son  OTHER MEDICAL CONDITIONS: Hypertension, Hyperlipidemia, Coronary artery disease, CHF, Depression    REVIEW OF SYSTEMS: Full 14 system review of systems performed and negative with exception of: As noted in the HPI   ALLERGIES: Allergies  Allergen Reactions   Anesthetics, Halogenated Other (See Comments)    Per patient's daughter, she can not tolerate anesthetics.    Etomidate  Other (See Comments)    Heart stopped during anesthesia    Fentanyl  Other (See Comments)    Heart stopped during anesthesia    Percocet [Oxycodone -Acetaminophen ] Other (See Comments)    Headaches    Diazepam Other (See Comments)    Agitation   Lorazepam  Nausea Only   Tramadol  Hcl Nausea And Vomiting   Haloperidol Nausea And Vomiting    **Haldol**   Morphine  Sulfate Nausea Only    agitation   Propoxyphene Nausea And Vomiting    **Darvocet** no longer available   Versed  [Midazolam ] Other (See Comments)    Heart stopped during anesthesia    HOME MEDICATIONS: Outpatient Medications Prior to Visit  Medication Sig Dispense Refill   acetaminophen  (TYLENOL ) 325 MG tablet Take 2 tablets (650 mg total) by mouth every 6 (six) hours as needed for mild pain (pain score 1-3) or headache.     apixaban  (ELIQUIS ) 2.5 MG TABS tablet Take 1 tablet (2.5 mg total) by mouth 2 (two) times daily.     B Complex-C (B-COMPLEX WITH VITAMIN C) tablet Take 1 tablet by mouth daily.     calcium  carbonate (TUMS - DOSED IN MG ELEMENTAL CALCIUM ) 500 MG chewable tablet Chew 1 tablet (200 mg of elemental calcium  total) by mouth 3 (three) times daily as needed for indigestion or heartburn.     cefTAZidime  0.5 g in dextrose  5 % 50 mL Inject 0.5 g into the vein daily. Thorough May 30th     cholecalciferol  (CHOLECALCIFEROL ) 25 MCG tablet Take 1 tablet (1,000 Units total) by mouth daily.     colestipol  (COLESTID ) 1 g tablet Take 1 tablet (1 g total) by mouth 3 (three) times daily. 90 tablet 2   diclofenac  (FLECTOR ) 1.3 % PTCH Place 1 patch onto the skin 2 (two) times daily.     diclofenac  Sodium (VOLTAREN ) 1 % GEL Apply 2 g topically 3 (three) times daily as needed (right knee).     FLUoxetine  (PROZAC ) 20 MG capsule Take 20 mg by mouth daily.     gabapentin  (NEURONTIN ) 300 MG capsule Take 300 mg by mouth at bedtime.     l-methylfolate-B6-B12 (METANX) 3-35-2 MG TABS tablet Take 1 tablet by mouth daily.     lipase/protease/amylase (CREON ) 36000 UNITS CPEP capsule Take 1 capsule  (36,000 Units total) by mouth 3 (three) times daily before meals. 200 capsule 2   melatonin 3 MG TABS tablet Take 1 tablet (3 mg total) by mouth at bedtime as needed. 30 tablet 0   memantine  (NAMENDA ) 5 MG tablet Take 1 tablet (5 mg total) by mouth daily.     mirtazapine  (REMERON ) 7.5 MG tablet Take 7.5 mg by mouth at bedtime.     Mouthwashes (MOUTH RINSE) LIQD solution 15 mLs by Mouth Rinse route as needed (for oral care).     multivitamin (RENA-VIT) TABS tablet Take 1 tablet by mouth at bedtime.     oxyCODONE  (OXY IR/ROXICODONE ) 5 MG immediate release tablet Take 1 tablet (5 mg total) by mouth every 6 (six) hours as needed for severe pain (pain score 7-10). 10 tablet 0   pantoprazole  (PROTONIX ) 40 MG tablet Take  1 tablet (40 mg total) by mouth 2 (two) times daily. 60 tablet 0   prochlorperazine  (COMPAZINE ) 5 MG tablet Take 1-2 tablets (5-10 mg total) by mouth every 6 (six) hours as needed for nausea.     senna (SENOKOT) 8.6 MG TABS tablet Take 2 tablets (17.2 mg total) by mouth daily.     sevelamer  carbonate (RENVELA ) 800 MG tablet Take 2 tablets (1,600 mg total) by mouth 3 (three) times daily with meals.     torsemide  (DEMADEX ) 100 MG tablet Daily on Sun, Tue, Thurs, Sat.     vancomycin  (VANCOREADY) 750 MG/150ML SOLN Inject 150 mLs (750 mg total) into the vein every Monday, Wednesday, and Friday with hemodialysis. Continue through May 30th.     rivastigmine  (EXELON ) 1.5 MG capsule Take 1 capsule by mouth twice daily 60 capsule 0   No facility-administered medications prior to visit.    PAST MEDICAL HISTORY: Past Medical History:  Diagnosis Date   Abdominal discomfort    Chronic N/V/D. Presumptive dx Crohn's dx per elevated p ANCA. Failed Entocort and Pentasa. Sep 2003 - ileocolectomy c anastomosis per Dr Effie 2/2 adhesions - path was hegative for Crohns. EGD, Sm bowel follow through (11/03), and an eteroclysis (10/03) were unrevealing. Cuases hypomag and hypocalcemia.   Adnexal mass  10/2001   s/p lap BSO (R ovarian fibroma) & lysis of adhesions   Allergy    Seasonal   Anemia    Multifactorial. Baseline HgB 10-11 ish. B12 def - 150 in 3/10. Fe Def - ferritin 35 3/10. Both are being repleted.   Breast cancer (HCC) 03/16/2013   right, 5 o'clock   CAD (coronary artery disease) 1996   1996 - PTCA and angioplasty diagonal branch. 2000 - Rotoblator & angiopllasty of diagonal. 2006 - subendocardial AMI, DES to proximal LAD.SABRA Also had 90% stenosis in distal apical LAD. EF 55 with apical hypokinesis. Indefinite ASA and Plavix .   CHF (congestive heart failure) (HCC)    Chronic kidney disease, stage 4 (severe) (HCC)    Chronic pain    CT 10/10 = Spinal stenosis L2 - S1.   COVID-19 11/02/2020   Diabetes mellitus    Insulin  dependent   Diabetes mellitus type 2 in obese 05/03/2010   Managed on lantus  and novolog . Has diabetic nephropathy. Metformin D/C'd 2012 2/2 creatinine.  No diabetic retinopathy per 3/11.    Gout    Hx of radiation therapy 06/02/13- 07/16/13   right rbeast 4500 cGy 25 sessions, right breast boost 1600 cGy in 8 sessions   Hyperlipidemia    Managed with both a statin and Welchol . Welchol  stopped 2014 2/2 cost and started on fenofibrate     Hypertension    2006 B renal arteries patent. 2003 MRA - no RAS. 2003 pheo W/U Dr Brinda reportedly negative.   Hypoxia 07/23/2017   Lupus    Lymphedema of breast    Personal history of radiation therapy 2015   Pneumonia of left lung due to infectious organism    RBBB    Renal vein thrombosis (HCC)    SBO (small bowel obstruction) (HCC) 09/17/2017   Secondary hyperparathyroidism    Stroke [facility])    Incidental finding MRI 2002 L lacunar infarct   Vitamin B12 deficiency    Vitamin D  deficiency    Wears dentures    top    PAST SURGICAL HISTORY: Past Surgical History:  Procedure Laterality Date   ABDOMINAL HYSTERECTOMY     ARTERY REPAIR Left 07/01/2023   Procedure: REPAIR, ARTERY,  BRACHIAL USING VEIN PATCH;  Surgeon:  Gretta Lonni PARAS, MD;  Location: [facility] OR;  Service: Vascular;  Laterality: Left;   AVGG REMOVAL Left 07/01/2023   Procedure: REMOVAL OF ARTERIOVENOUS GORETEX GRAFT (AVGG);  Surgeon: Gretta Lonni PARAS, MD;  Location: [facility] OR;  Service: Vascular;  Laterality: Left;  LEFT ARM   BILATERAL SALPINGOOPHORECTOMY  8/03   Lap BSO (R ovarian fibroma) and adhesion lysis   BIOPSY  09/08/2020   Procedure: BIOPSY;  Surgeon: Donnald Charleston, MD;  Location: [facility]COPY;  Service: Endoscopy;;   BOWEL RESECTION  2003   ileocolectomy with anastomosis 2/2 adhesions   BREAST BIOPSY Right 2015   BREAST BIOPSY Right 07/2014   BREAST LUMPECTOMY Right 04/22/2013   BREAST LUMPECTOMY WITH NEEDLE LOCALIZATION AND AXILLARY SENTINEL LYMPH NODE BX Right 04/22/2013   Procedure: BREAST LUMPECTOMY WITH NEEDLE LOCALIZATION AND AXILLARY SENTINEL LYMPH NODE BX;  Surgeon: Jina Nephew, MD;  Location: [facility];  Service: General;  Laterality: Right;   CARDIAC CATHETERIZATION     2 stents   CHOLECYSTECTOMY     COLONOSCOPY     COLONOSCOPY N/A 05/30/2023   Procedure: COLONOSCOPY;  Surgeon: Elicia Claw, MD;  Location: [facility]COPY;  Service: Gastroenterology;  Laterality: N/A;   ESOPHAGOGASTRODUODENOSCOPY N/A 09/08/2020   Procedure: ESOPHAGOGASTRODUODENOSCOPY (EGD);  Surgeon: Donnald Charleston, MD;  Location: [facility];  Service: Endoscopy;  Laterality: N/A;   ESOPHAGOGASTRODUODENOSCOPY N/A 05/30/2023   Procedure: EGD (ESOPHAGOGASTRODUODENOSCOPY);  Surgeon: Elicia Claw, MD;  Location: [facility] ENDOSCOPY;  Service: Gastroenterology;  Laterality: N/A;   HEMICOLECTOMY     R sided hemicolectomy   HERNIA REPAIR     Ventral hernia repair   INSERTION OF ARTERIOVENOUS (AV) ARTEGRAFT ARM Left 06/21/2023   Procedure: INSERTION, GRAFT, ARTERIOVENOUS, UPPER EXTREMITY;  Surgeon: Sheree Penne Lonni, MD;  Location: [facility] OR;  Service: Vascular;  Laterality: Left;   INSERTION OF DIALYSIS CATHETER Right 06/21/2023    Procedure: INSERTION OF DIALYSIS CATHETER;  Surgeon: Sheree Penne Lonni, MD;  Location: [facility] OR;  Service: Vascular;  Laterality: Right;   IRRIGATION AND DEBRIDEMENT KNEE Right 07/19/2023   Procedure: IRRIGATION AND DEBRIDEMENT KNEE;  Surgeon: Kendal Franky SQUIBB, MD;  Location: [facility] OR;  Service: Orthopedics;  Laterality: Right;   PTCA  4/06    FAMILY HISTORY: Family History  Problem Relation Age of Onset   Breast cancer Mother 32   Pancreatic cancer Brother 47   Lung cancer Maternal Aunt    Breast cancer Maternal Aunt    Kidney failure Maternal Aunt    Prostate cancer Maternal Uncle    Heart attack Maternal Grandmother    Breast cancer Maternal Grandmother    Colon cancer Maternal Grandmother    Clotting disorder Maternal Grandmother    Diabetes Maternal Grandmother    Hypertension Daughter    Pancreatic cancer Other     SOCIAL HISTORY: Social History   Socioeconomic History   Marital status: Widowed    Spouse name: Not on file   Number of children: 5   Years of education: Not on file   Highest education level: High school graduate  Occupational History   Occupation: retired    Associate Professor: [employer]  Tobacco Use   Smoking status: Never   Smokeless tobacco: Never  Vaping Use   Vaping status: Never Used  Substance and Sexual Activity   Alcohol use: No    Alcohol/week: 0.0 standard drinks of alcohol   Drug use: No   Sexual activity: Not on file  Other Topics  Concern   Not on file  Social History Narrative   She is separated 2 sons 3 daughters   She has grandchildren and great-grandchildren   She is retired   Never smoker no tobacco now no drugs no alcohol no caffeine   Right handed   Social Drivers of Corporate Investment Banker Strain: Low Risk  (10/25/2021)   Overall Financial Resource Strain (CARDIA)    Difficulty of Paying Living Expenses: Not very hard  Food Insecurity: No Food Insecurity (07/11/2023)   Hunger Vital Sign    Worried About Running  Out of Food in the Last Year: Never true    Ran Out of Food in the Last Year: Never true  Transportation Needs: No Transportation Needs (07/11/2023)   PRAPARE - Administrator, Civil Service (Medical): No    Lack of Transportation (Non-Medical): No  Physical Activity: Not on file  Stress: Not on file  Social Connections: Patient Declined (07/11/2023)   Social Connection and Isolation Panel    Frequency of Communication with Friends and Family: Patient declined    Frequency of Social Gatherings with Friends and Family: Patient declined    Attends Religious Services: Patient declined    Database Administrator or Organizations: Patient declined    Attends Banker Meetings: Patient declined    Marital Status: Patient declined  Recent Concern: Social Connections - Socially Isolated (05/25/2023)   Social Connection and Isolation Panel    Frequency of Communication with Friends and Family: More than three times a week    Frequency of Social Gatherings with Friends and Family: Three times a week    Attends Religious Services: Never    Active Member of Clubs or Organizations: No    Attends Banker Meetings: Never    Marital Status: Separated  Intimate Partner Violence: Not At Risk (07/11/2023)   Humiliation, Afraid, Rape, and Kick questionnaire    Fear of Current or Ex-Partner: No    Emotionally Abused: No    Physically Abused: No    Sexually Abused: No    PHYSICAL EXAM  GENERAL EXAM/CONSTITUTIONAL: Vitals:  Vitals:   01/16/24 1111  BP: (!) 149/76  Pulse: 86  Weight: 153 lb (69.4 kg)  Height: 5' 4 (1.626 m)   Patient is in no distress; well developed, nourished and groomed; neck is supple  NEUROLOGIC: MENTAL STATUS:     01/16/2024   11:15 AM 12/27/2022    3:01 PM  MMSE - Mini Mental State Exam  Orientation to time 2 3  Orientation to Place 5 5  Registration 3 3  Attention/ Calculation 1 1  Recall 0 0  Language- name 2 objects 2 2   Language- repeat 1 0  Language- follow 3 step command 3 3  Language- read & follow direction 1 1  Write a sentence 1 1  Copy design 0 0  Total score 19 19   CRANIAL NERVE:  2nd, 3rd, 4th, 6th- visual fields full to confrontation, extraocular muscles intact, no nystagmus 5th - facial sensation symmetric 7th - facial strength symmetric 8th - hearing intact 9th - palate elevates symmetrically, uvula midline 11th - shoulder shrug symmetric 12th - tongue protrusion midline  MOTOR:  normal bulk and tone, full strength in the BUE, BLE  SENSORY:  normal and symmetric to light touch  COORDINATION:  finger-nose-finger, fine finger movements normal  GAIT/STATION:  Antalgic gait with use of a rolling walker   DIAGNOSTIC DATA (LABS, IMAGING, TESTING) -  I reviewed patient records, labs, notes, testing and imaging myself where available.  Lab Results  Component Value Date   WBC 7.0 08/28/2023   HGB 10.4 (L) 08/28/2023   HCT 35.0 (L) 08/28/2023   MCV 92.1 08/28/2023   PLT 156 08/28/2023      Component Value Date/Time   NA 136 08/28/2023 1816   NA 144 07/19/2020 1458   NA 140 03/14/2017 0810   K 4.0 08/28/2023 1816   K 3.8 03/14/2017 0810   CL 95 (L) 08/28/2023 1816   CO2 25 08/28/2023 1816   CO2 27 03/14/2017 0810   GLUCOSE 79 08/28/2023 1816   GLUCOSE 177 (H) 03/14/2017 0810   BUN 15 08/28/2023 1816   BUN 85 (HH) 07/19/2020 1458   BUN 35.6 (H) 03/14/2017 0810   CREATININE 2.61 (H) 08/28/2023 1816   CREATININE 2.31 (H) 10/25/2022 1355   CREATININE 1.8 (H) 03/14/2017 0810   CALCIUM  8.6 (L) 08/28/2023 1816   CALCIUM  8.3 (L) 06/18/2023 0334   CALCIUM  9.6 03/14/2017 0810   PROT 8.0 08/28/2023 1816   PROT 7.8 03/14/2017 0810   ALBUMIN  3.2 (L) 08/28/2023 1816   ALBUMIN  3.5 03/14/2017 0810   AST 29 08/28/2023 1816   AST 12 (L) 10/25/2022 1355   AST 17 03/14/2017 0810   ALT 11 08/28/2023 1816   ALT 9 10/25/2022 1355   ALT 17 03/14/2017 0810   ALKPHOS 144 (H)  08/28/2023 1816   ALKPHOS 61 03/14/2017 0810   BILITOT 0.4 08/28/2023 1816   BILITOT 0.3 10/25/2022 1355   BILITOT 0.28 03/14/2017 0810   GFRNONAA 18 (L) 08/28/2023 1816   GFRNONAA 22 (L) 10/25/2022 1355   GFRNONAA 45 (L) 12/17/2013 1158   GFRAA 21 (L) 12/21/2019 0845   GFRAA 52 (L) 12/17/2013 1158   Lab Results  Component Value Date   CHOL 117 11/16/2020   HDL 59 11/16/2020   LDLCALC 40 11/16/2020   LDLDIRECT 55 07/01/2012   TRIG 89 11/16/2020   CHOLHDL 2.0 11/16/2020   Lab Results  Component Value Date   HGBA1C 6.2 (H) 05/08/2023   Lab Results  Component Value Date   VITAMINB12 1,040 (H) 05/13/2023   Lab Results  Component Value Date   TSH 1.694 11/15/2020    MRI Brain 07/28/2022 1. No acute intracranial abnormality. 2. Old small vessel infarcts of the deep gray and white matter. 3. Chronic microvascular ischemia.       ASSESSMENT AND PLAN  78 y.o. year old female with with medical history including hypertension, hyperlipidemia, CAD, CHF, depression who is presenting with memory loss, repeating herself, being forgetful for past few years and getting worse.  She was initially evaluated last year by Dr. Gregg for memory complaints noting cognitive decline after the passing of her son and increased depression.  ATN profile consistent with Alzheimer's disease.  Will further pursue amyloid PET scan as well as neurocognitive evaluation.  Recommend increasing Exelon  to 3 mg twice daily, MMSE stable although subjectively feels cognition has continued to gradually decline.  She will follow-up after completion of imaging for further treatment plan as she and daughter are interested in pursuing antiamyloid therapies.   ADDENDUM 01/27/2024: per insurance requirements to pursue PET scan, note needs to indicate level of impairment and need to state how PET scan results will influence treatment/management decisions.  Level of current cognitive impairment is mild based on MMSE  testing.  As noted above, patient and daughter interested in pursuing antiamyloid therapies which require completion of PET  scan. She is currently on Exelon , if PET scan consistent with Alzheimer's disease, will further discuss pursuing antiamyloid therapy such as Leqembi or Kisunla    Orders Placed This Encounter  Procedures   NM PET Brain Amyloid   Ambulatory referral to Neuropsychology    Meds ordered this encounter  Medications   rivastigmine  (EXELON ) 3 MG capsule    Sig: Take 1 capsule (3 mg total) by mouth 2 (two) times daily.    Dispense:  180 capsule    Refill:  3    Return if symptoms worsen or fail to improve.  I personally spent a total of 30 minutes in the care of the patient today including preparing to see the patient, performing a medically appropriate exam/evaluation, counseling and educating, placing orders, and documenting clinical information in the EHR.  Harlene Bogaert, AGNP-BC  [facility] [address] [place], [place] [address]  Phone [phone] Fax [phone] Note: This document was prepared with digital dictation and possible smart phrase technology. Any transcriptional errors that result from this process are unintentional.

## 2024-01-16 ENCOUNTER — Telehealth: Payer: Self-pay | Admitting: Adult Health

## 2024-01-16 ENCOUNTER — Ambulatory Visit (INDEPENDENT_AMBULATORY_CARE_PROVIDER_SITE_OTHER): Admitting: Adult Health

## 2024-01-16 ENCOUNTER — Encounter: Payer: Self-pay | Admitting: Adult Health

## 2024-01-16 VITALS — BP 149/76 | HR 86 | Ht 64.0 in | Wt 153.0 lb

## 2024-01-16 DIAGNOSIS — R4189 Other symptoms and signs involving cognitive functions and awareness: Secondary | ICD-10-CM | POA: Diagnosis not present

## 2024-01-16 DIAGNOSIS — G309 Alzheimer's disease, unspecified: Secondary | ICD-10-CM

## 2024-01-16 MED ORDER — RIVASTIGMINE TARTRATE 3 MG PO CAPS: 3.0000 mg | ORAL_CAPSULE | Freq: Two times a day (BID) | ORAL | 3 refills | Status: AC

## 2024-01-16 NOTE — Telephone Encounter (Signed)
 Referral for Neuropsychology sent thru epic to [facility]  [facility] Phone: [phone]

## 2024-01-16 NOTE — Patient Instructions (Addendum)
 Your Plan:  Increase Exelon  to 3mg  twice daily   You will be called to schedule an amyloid PET scan - you will be called after completion of imaging for results   You will be called to schedule a neurocognitive evaluation for more in depth memory test      Follow up will be determined after completion of imaging      Thank you for coming to see us  at [facility]. I hope we have been able to provide you high quality care today.  You may receive a patient satisfaction survey over the next few weeks. We would appreciate your feedback and comments so that we may continue to improve ourselves and the health of our patients.

## 2024-01-17 ENCOUNTER — Other Ambulatory Visit: Payer: Self-pay | Admitting: Neurology

## 2024-01-17 DIAGNOSIS — Z992 Dependence on renal dialysis: Secondary | ICD-10-CM | POA: Diagnosis not present

## 2024-01-17 DIAGNOSIS — N2581 Secondary hyperparathyroidism of renal origin: Secondary | ICD-10-CM | POA: Diagnosis not present

## 2024-01-17 DIAGNOSIS — I129 Hypertensive chronic kidney disease with stage 1 through stage 4 chronic kidney disease, or unspecified chronic kidney disease: Secondary | ICD-10-CM | POA: Diagnosis not present

## 2024-01-17 DIAGNOSIS — N186 End stage renal disease: Secondary | ICD-10-CM | POA: Diagnosis not present

## 2024-01-20 DIAGNOSIS — N2581 Secondary hyperparathyroidism of renal origin: Secondary | ICD-10-CM | POA: Diagnosis not present

## 2024-01-20 DIAGNOSIS — N186 End stage renal disease: Secondary | ICD-10-CM | POA: Diagnosis not present

## 2024-01-20 DIAGNOSIS — Z992 Dependence on renal dialysis: Secondary | ICD-10-CM | POA: Diagnosis not present

## 2024-01-20 NOTE — Telephone Encounter (Signed)
 Last seen on 01/16/24 Follow up scheduled 01/16/24   Dose changed to 3 mg tablet at office visit 01/16/24  Rx denied

## 2024-01-22 DIAGNOSIS — N2581 Secondary hyperparathyroidism of renal origin: Secondary | ICD-10-CM | POA: Diagnosis not present

## 2024-01-22 DIAGNOSIS — Z992 Dependence on renal dialysis: Secondary | ICD-10-CM | POA: Diagnosis not present

## 2024-01-22 DIAGNOSIS — N186 End stage renal disease: Secondary | ICD-10-CM | POA: Diagnosis not present

## 2024-01-23 ENCOUNTER — Telehealth: Payer: Self-pay | Admitting: Adult Health

## 2024-01-23 NOTE — Telephone Encounter (Signed)
 To approve the pet scan, her insurance is requesting that the office notes be updated to say:  Severity of cognitive impairment diagnosis (Mild, moderate or severe)  Documentation showing that the PET scan results will influence treatment or management decisions

## 2024-01-27 DIAGNOSIS — N186 End stage renal disease: Secondary | ICD-10-CM | POA: Diagnosis not present

## 2024-01-27 NOTE — Telephone Encounter (Signed)
 Addendum to prior OV note completed.

## 2024-01-28 NOTE — Telephone Encounter (Signed)
 Submitted the updated office notes.

## 2024-01-28 NOTE — Telephone Encounter (Signed)
 [name]: [id number] exp. 01/21/2024 - 03/21/2024 sent to [facility] [phone]

## 2024-01-31 DIAGNOSIS — N2581 Secondary hyperparathyroidism of renal origin: Secondary | ICD-10-CM | POA: Diagnosis not present

## 2024-02-05 DIAGNOSIS — N2581 Secondary hyperparathyroidism of renal origin: Secondary | ICD-10-CM | POA: Diagnosis not present

## 2024-02-05 DIAGNOSIS — N186 End stage renal disease: Secondary | ICD-10-CM | POA: Diagnosis not present

## 2024-02-05 DIAGNOSIS — Z992 Dependence on renal dialysis: Secondary | ICD-10-CM | POA: Diagnosis not present

## 2024-02-06 ENCOUNTER — Encounter (HOSPITAL_COMMUNITY)
Admission: RE | Admit: 2024-02-06 | Discharge: 2024-02-06 | Disposition: A | Discharge status: Home / Self Care | Source: Ambulatory Visit | Attending: Adult Health | Admitting: Adult Health

## 2024-02-06 DIAGNOSIS — R4189 Other symptoms and signs involving cognitive functions and awareness: Secondary | ICD-10-CM | POA: Insufficient documentation

## 2024-02-06 DIAGNOSIS — G309 Alzheimer's disease, unspecified: Secondary | ICD-10-CM | POA: Insufficient documentation

## 2024-02-06 DIAGNOSIS — R413 Other amnesia: Secondary | ICD-10-CM | POA: Diagnosis not present

## 2024-02-06 MED ORDER — FLORBETAPIR F 18 500-1900 MBQ/ML IV SOLN
10.0000 | Freq: Once | INTRAVENOUS | Status: AC
  Administered 2024-02-06: 10.679 via INTRAVENOUS

## 2024-02-07 DIAGNOSIS — N186 End stage renal disease: Secondary | ICD-10-CM | POA: Diagnosis not present

## 2024-02-07 DIAGNOSIS — N2581 Secondary hyperparathyroidism of renal origin: Secondary | ICD-10-CM | POA: Diagnosis not present

## 2024-02-07 DIAGNOSIS — Z992 Dependence on renal dialysis: Secondary | ICD-10-CM | POA: Diagnosis not present

## 2024-02-09 DIAGNOSIS — N2581 Secondary hyperparathyroidism of renal origin: Secondary | ICD-10-CM | POA: Diagnosis not present

## 2024-02-09 DIAGNOSIS — Z992 Dependence on renal dialysis: Secondary | ICD-10-CM | POA: Diagnosis not present

## 2024-02-10 ENCOUNTER — Ambulatory Visit: Payer: Self-pay | Admitting: Adult Health

## 2024-02-11 DIAGNOSIS — Z992 Dependence on renal dialysis: Secondary | ICD-10-CM | POA: Diagnosis not present

## 2024-02-11 DIAGNOSIS — N2581 Secondary hyperparathyroidism of renal origin: Secondary | ICD-10-CM | POA: Diagnosis not present

## 2024-02-11 NOTE — Telephone Encounter (Signed)
 LVM for daughter (checked DPR) to call back and discuss pet scan results

## 2024-02-11 NOTE — Telephone Encounter (Signed)
-----   Message from Harlene Bogaert sent at 02/10/2024  2:33 PM EST ----- Please call daughter regarding results. Recent PET scan did not show evidence of Alzheimer's disease. Positive lab work and negative imaging can indicate early Alzheimer's disease vs non Alzheimer's  disease process. Would recommend pursing neurocognitive evaluation as previously ordered. At this time, antiamyloid therapy is not recommended due to lack of amyloid plaque on recent PET scan.  Would  recommend continuation with Exelon . Thank you.  ----- Message ----- From: Interface, Rad Results In Sent: 02/06/2024   5:17 PM EST To: Harlene Bogaert, NP

## 2024-02-12 NOTE — Telephone Encounter (Signed)
 Pt's daughter has returned call to CMA

## 2024-02-12 NOTE — Telephone Encounter (Signed)
 Spoke to daughter Dedra Potters (Checked [place]) Gave PET scan results Gave Jessica,NP recommendations. Daughter did ask for Pet scan results to be mailed to patients home .Daughter thanked me for calling . Forward request to medical records

## 2024-02-14 DIAGNOSIS — N186 End stage renal disease: Secondary | ICD-10-CM | POA: Diagnosis not present

## 2024-02-16 DIAGNOSIS — N186 End stage renal disease: Secondary | ICD-10-CM | POA: Diagnosis not present

## 2024-02-16 DIAGNOSIS — Z992 Dependence on renal dialysis: Secondary | ICD-10-CM | POA: Diagnosis not present

## 2024-02-16 DIAGNOSIS — I129 Hypertensive chronic kidney disease with stage 1 through stage 4 chronic kidney disease, or unspecified chronic kidney disease: Secondary | ICD-10-CM | POA: Diagnosis not present

## 2024-02-21 ENCOUNTER — Encounter: Payer: Self-pay | Admitting: Psychology

## 2024-02-28 ENCOUNTER — Encounter: Payer: Self-pay | Admitting: Psychology

## 2024-04-07 ENCOUNTER — Encounter: Payer: Self-pay | Admitting: Hematology

## 2024-04-14 ENCOUNTER — Ambulatory Visit: Admitting: Vascular Surgery

## 2024-04-21 ENCOUNTER — Encounter: Admitting: Psychology

## 2024-06-09 ENCOUNTER — Ambulatory Visit: Admitting: Vascular Surgery
